# Patient Record
Sex: Male | Born: 1960 | Race: White | Hispanic: No | Smoking: Former smoker
Health system: Southern US, Community
[De-identification: ages and names within clinical notes are randomized; demographics above are authoritative.]

## PROBLEM LIST (undated history)

## (undated) DIAGNOSIS — J189 Pneumonia, unspecified organism: Secondary | ICD-10-CM

## (undated) DIAGNOSIS — W57XXXA Bitten or stung by nonvenomous insect and other nonvenomous arthropods, initial encounter: Secondary | ICD-10-CM

## (undated) DIAGNOSIS — E119 Type 2 diabetes mellitus without complications: Secondary | ICD-10-CM

## (undated) DIAGNOSIS — J449 Chronic obstructive pulmonary disease, unspecified: Secondary | ICD-10-CM

## (undated) DIAGNOSIS — I10 Essential (primary) hypertension: Secondary | ICD-10-CM

## (undated) DIAGNOSIS — J939 Pneumothorax, unspecified: Secondary | ICD-10-CM

## (undated) DIAGNOSIS — R0600 Dyspnea, unspecified: Secondary | ICD-10-CM

## (undated) DIAGNOSIS — J9621 Acute and chronic respiratory failure with hypoxia: Secondary | ICD-10-CM

## (undated) HISTORY — PX: LUNG REMOVAL, PARTIAL: SHX233

---
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
## 2013-06-04 ENCOUNTER — Emergency Department (HOSPITAL_COMMUNITY): Payer: Self-pay

## 2013-06-04 ENCOUNTER — Inpatient Hospital Stay (HOSPITAL_COMMUNITY)
Admission: EM | Admit: 2013-06-04 | Discharge: 2013-06-22 | DRG: 163 | Disposition: A | Discharge status: Rehabilitation Facility | Payer: Self-pay | Attending: General Surgery | Admitting: General Surgery

## 2013-06-04 ENCOUNTER — Encounter (HOSPITAL_COMMUNITY): Payer: Self-pay | Admitting: Emergency Medicine

## 2013-06-04 ENCOUNTER — Emergency Department (HOSPITAL_COMMUNITY): Payer: MEDICAID

## 2013-06-04 DIAGNOSIS — S272XXA Traumatic hemopneumothorax, initial encounter: Principal | ICD-10-CM | POA: Diagnosis present

## 2013-06-04 DIAGNOSIS — S225XXA Flail chest, initial encounter for closed fracture: Secondary | ICD-10-CM | POA: Diagnosis present

## 2013-06-04 DIAGNOSIS — E875 Hyperkalemia: Secondary | ICD-10-CM | POA: Diagnosis present

## 2013-06-04 DIAGNOSIS — J90 Pleural effusion, not elsewhere classified: Secondary | ICD-10-CM

## 2013-06-04 DIAGNOSIS — F10239 Alcohol dependence with withdrawal, unspecified: Secondary | ICD-10-CM | POA: Diagnosis not present

## 2013-06-04 DIAGNOSIS — E46 Unspecified protein-calorie malnutrition: Secondary | ICD-10-CM | POA: Diagnosis present

## 2013-06-04 DIAGNOSIS — R4182 Altered mental status, unspecified: Secondary | ICD-10-CM

## 2013-06-04 DIAGNOSIS — W11XXXA Fall on and from ladder, initial encounter: Secondary | ICD-10-CM | POA: Diagnosis present

## 2013-06-04 DIAGNOSIS — J189 Pneumonia, unspecified organism: Secondary | ICD-10-CM

## 2013-06-04 DIAGNOSIS — J942 Hemothorax: Secondary | ICD-10-CM | POA: Diagnosis present

## 2013-06-04 DIAGNOSIS — S42033A Displaced fracture of lateral end of unspecified clavicle, initial encounter for closed fracture: Secondary | ICD-10-CM | POA: Diagnosis present

## 2013-06-04 DIAGNOSIS — F17200 Nicotine dependence, unspecified, uncomplicated: Secondary | ICD-10-CM | POA: Diagnosis present

## 2013-06-04 DIAGNOSIS — R7309 Other abnormal glucose: Secondary | ICD-10-CM | POA: Diagnosis not present

## 2013-06-04 DIAGNOSIS — J9600 Acute respiratory failure, unspecified whether with hypoxia or hypercapnia: Secondary | ICD-10-CM

## 2013-06-04 DIAGNOSIS — D62 Acute posthemorrhagic anemia: Secondary | ICD-10-CM | POA: Diagnosis not present

## 2013-06-04 DIAGNOSIS — J9819 Other pulmonary collapse: Secondary | ICD-10-CM | POA: Diagnosis not present

## 2013-06-04 DIAGNOSIS — R091 Pleurisy: Secondary | ICD-10-CM | POA: Diagnosis not present

## 2013-06-04 DIAGNOSIS — I10 Essential (primary) hypertension: Secondary | ICD-10-CM | POA: Diagnosis present

## 2013-06-04 DIAGNOSIS — S2249XA Multiple fractures of ribs, unspecified side, initial encounter for closed fracture: Secondary | ICD-10-CM

## 2013-06-04 DIAGNOSIS — J9620 Acute and chronic respiratory failure, unspecified whether with hypoxia or hypercapnia: Secondary | ICD-10-CM

## 2013-06-04 DIAGNOSIS — R404 Transient alteration of awareness: Secondary | ICD-10-CM | POA: Diagnosis not present

## 2013-06-04 DIAGNOSIS — R339 Retention of urine, unspecified: Secondary | ICD-10-CM | POA: Diagnosis not present

## 2013-06-04 DIAGNOSIS — G9340 Encephalopathy, unspecified: Secondary | ICD-10-CM | POA: Diagnosis not present

## 2013-06-04 DIAGNOSIS — F1120 Opioid dependence, uncomplicated: Secondary | ICD-10-CM | POA: Diagnosis present

## 2013-06-04 DIAGNOSIS — F10939 Alcohol use, unspecified with withdrawal, unspecified: Secondary | ICD-10-CM | POA: Diagnosis not present

## 2013-06-04 DIAGNOSIS — F1020 Alcohol dependence, uncomplicated: Secondary | ICD-10-CM | POA: Diagnosis present

## 2013-06-04 DIAGNOSIS — E876 Hypokalemia: Secondary | ICD-10-CM | POA: Diagnosis not present

## 2013-06-04 DIAGNOSIS — J9622 Acute and chronic respiratory failure with hypercapnia: Secondary | ICD-10-CM

## 2013-06-04 DIAGNOSIS — F1210 Cannabis abuse, uncomplicated: Secondary | ICD-10-CM | POA: Diagnosis present

## 2013-06-04 DIAGNOSIS — W19XXXA Unspecified fall, initial encounter: Secondary | ICD-10-CM

## 2013-06-04 DIAGNOSIS — Z8249 Family history of ischemic heart disease and other diseases of the circulatory system: Secondary | ICD-10-CM

## 2013-06-04 DIAGNOSIS — F1010 Alcohol abuse, uncomplicated: Secondary | ICD-10-CM

## 2013-06-04 DIAGNOSIS — J9621 Acute and chronic respiratory failure with hypoxia: Secondary | ICD-10-CM

## 2013-06-04 HISTORY — DX: Pneumothorax, unspecified: J93.9

## 2013-06-04 LAB — CBC WITH DIFFERENTIAL/PLATELET
BASOS ABS: 0 10*3/uL (ref 0.0–0.1)
BASOS PCT: 0 % (ref 0–1)
EOS ABS: 0.1 10*3/uL (ref 0.0–0.7)
Eosinophils Relative: 0 % (ref 0–5)
HCT: 43.4 % (ref 39.0–52.0)
Hemoglobin: 14.5 g/dL (ref 13.0–17.0)
Lymphocytes Relative: 8 % — ABNORMAL LOW (ref 12–46)
Lymphs Abs: 1.2 10*3/uL (ref 0.7–4.0)
MCH: 33.3 pg (ref 26.0–34.0)
MCHC: 33.4 g/dL (ref 30.0–36.0)
MCV: 99.5 fL (ref 78.0–100.0)
Monocytes Absolute: 1.1 10*3/uL — ABNORMAL HIGH (ref 0.1–1.0)
Monocytes Relative: 8 % (ref 3–12)
NEUTROS ABS: 11.5 10*3/uL — AB (ref 1.7–7.7)
NEUTROS PCT: 84 % — AB (ref 43–77)
PLATELETS: 212 10*3/uL (ref 150–400)
RBC: 4.36 MIL/uL (ref 4.22–5.81)
RDW: 12.6 % (ref 11.5–15.5)
WBC: 13.9 10*3/uL — ABNORMAL HIGH (ref 4.0–10.5)

## 2013-06-04 LAB — BASIC METABOLIC PANEL
BUN: 15 mg/dL (ref 6–23)
CO2: 26 mEq/L (ref 19–32)
Calcium: 9.5 mg/dL (ref 8.4–10.5)
Chloride: 103 mEq/L (ref 96–112)
Creatinine, Ser: 0.83 mg/dL (ref 0.50–1.35)
Glucose, Bld: 139 mg/dL — ABNORMAL HIGH (ref 70–99)
POTASSIUM: 4.2 meq/L (ref 3.7–5.3)
Sodium: 142 mEq/L (ref 137–147)

## 2013-06-04 LAB — I-STAT TROPONIN, ED: TROPONIN I, POC: 0.01 ng/mL (ref 0.00–0.08)

## 2013-06-04 LAB — I-STAT CREATININE, ED: CREATININE: 1.1 mg/dL (ref 0.50–1.35)

## 2013-06-04 MED ORDER — ALBUTEROL SULFATE (2.5 MG/3ML) 0.083% IN NEBU
5.0000 mg | INHALATION_SOLUTION | Freq: Once | RESPIRATORY_TRACT | Status: AC
  Administered 2013-06-04: 5 mg via RESPIRATORY_TRACT
  Filled 2013-06-04: qty 6

## 2013-06-04 MED ORDER — IOHEXOL 300 MG/ML  SOLN
100.0000 mL | Freq: Once | INTRAMUSCULAR | Status: DC | PRN
Start: 2013-06-04 — End: 2013-06-04

## 2013-06-04 MED ORDER — HYDROMORPHONE HCL PF 1 MG/ML IJ SOLN
1.0000 mg | Freq: Once | INTRAMUSCULAR | Status: AC
  Administered 2013-06-04: 1 mg via INTRAVENOUS
  Filled 2013-06-04: qty 1

## 2013-06-04 MED ORDER — ONDANSETRON HCL 4 MG/2ML IJ SOLN
4.0000 mg | Freq: Once | INTRAMUSCULAR | Status: AC
  Administered 2013-06-04: 4 mg via INTRAVENOUS
  Filled 2013-06-04: qty 2

## 2013-06-04 MED ORDER — HYDROMORPHONE HCL PF 1 MG/ML IJ SOLN
0.5000 mg | INTRAMUSCULAR | Status: DC | PRN
  Administered 2013-06-04: 0.5 mg via INTRAVENOUS
  Filled 2013-06-04 (×2): qty 1

## 2013-06-04 MED ORDER — IOHEXOL 300 MG/ML  SOLN
100.0000 mL | Freq: Once | INTRAMUSCULAR | Status: AC | PRN
  Administered 2013-06-04: 100 mL via INTRAVENOUS

## 2013-06-04 NOTE — ED Notes (Signed)
Pt returned from radiology.

## 2013-06-04 NOTE — ED Notes (Addendum)
Presents post fall from ladder 6 feet up onto dirt, landed on back. C/o left side pain. Denies LOC, denies head pain.  Left breath sounds diminished-right diminished with expiratory wheezes. Pt states, "I can hardly breath"-chest equal expansion. No deformities.  Pt states, "My heart hurts, it started when I hit the ground"

## 2013-06-04 NOTE — ED Provider Notes (Signed)
Ruthell Rummage Venecia Mehl 8:00 PM patient discussed in sign out. Patient with a mechanical fall from a ladder landing onto his back and left side. CT scans pending. No LOC.  9:30 PM patient with multiple rib fractures from a fall. Small lung laceration and contusion. No large pneumothorax. Patient continues to be breathing well. Pt was seen by Attending Physician who has also spoken with Dr. Barry Dienes with Trauma.  Dr. Barry Dienes will see pt.  Martie Lee, PA-C 06/04/13 2210

## 2013-06-04 NOTE — ED Provider Notes (Signed)
CSN: [id number]     Arrival date & time 06/04/13  1733 History   First MD Initiated Contact with Patient 06/04/13 1833     Chief Complaint  Patient presents with  . Fall     (Consider location/radiation/quality/duration/timing/severity/associated sxs/prior Treatment) HPI Comments: Patient presents today after falling 6 feet from a ladder just prior to arrival.  He reports that he missed a step, which caused him to fall.  He states that he landed on his back and also hit his head when he fell.  He landed on dirt when he fell.  He denies LOC.  Denies nausea, vomiting, or vision changes.  Denies dizziness or lightheadedness.  He is complaining of pain of his back, left shoulder, chest, and head.  He reports mild SOB at this time.  He reports that he is currently not on any anticoagulants.  He denies any pain to his lower extremities.  Denies neck pain.  Denies abdominal pain. Denies numbness or tingling.    The history is provided by the patient.    Past Medical History  Diagnosis Date  . Pneumothorax    History reviewed. No pertinent past surgical history. History reviewed. No pertinent family history. History  Substance Use Topics  . Smoking status: Current Every Day Smoker    Types: Cigarettes  . Smokeless tobacco: Not on file  . Alcohol Use: Yes    Review of Systems  Respiratory: Positive for shortness of breath.   Cardiovascular: Positive for chest pain.  Gastrointestinal: Negative for nausea, vomiting and abdominal pain.  Musculoskeletal: Positive for back pain.       Left shoulder pain  Neurological: Positive for headaches.  All other systems reviewed and are negative.     Allergies  Review of patient's allergies indicates no known allergies.  Home Medications   Prior to Admission medications   Not on File   BP 166/97  Pulse 67  Temp(Src) 98.9 F (37.2 C)  Resp 18  SpO2 95% Physical Exam  Nursing note and vitals reviewed. Constitutional: He appears  well-developed and well-nourished.  HENT:  Head: Normocephalic and atraumatic.  Mouth/Throat: Oropharynx is clear and moist.  Eyes: EOM are normal. Pupils are equal, round, and reactive to light.  Cardiovascular: Normal rate, regular rhythm, normal heart sounds and intact distal pulses.   Pulses:      Radial pulses are 2+ on the left side.       Dorsalis pedis pulses are 2+ on the right side, and 2+ on the left side.  Pulmonary/Chest: Effort normal. No respiratory distress. He has decreased breath sounds. He has wheezes. He exhibits tenderness.  Diffuse decreased breath sounds Diffuse expiratory wheezing Tenderness to palpation of the left anterior chest  Abdominal: Soft. Bowel sounds are normal. He exhibits no distension and no mass. There is no tenderness. There is no rebound and no guarding.  Musculoskeletal:       Left shoulder: He exhibits decreased range of motion, tenderness and bony tenderness. He exhibits normal pulse.       Cervical back: He exhibits tenderness. He exhibits no swelling, no edema and no deformity.       Thoracic back: He exhibits tenderness and bony tenderness. He exhibits normal range of motion, no swelling, no edema and no deformity.       Lumbar back: He exhibits tenderness and bony tenderness. He exhibits normal range of motion, no swelling, no edema and no deformity.  Full ROM of lower extremities without pain  Neurological:  He is alert. No cranial nerve deficit or sensory deficit. GCS eye subscore is 4. GCS verbal subscore is 5. GCS motor subscore is 6.  Skin: Skin is warm and dry. No bruising and no ecchymosis noted.  Psychiatric: He has a normal mood and affect.    ED Course  Procedures (including critical care time) Labs Review Labs Reviewed  CBC WITH DIFFERENTIAL  BASIC METABOLIC PANEL  I-STAT [place], ED  I-STAT CREATIN[place], ED    Imaging Review No results found.   EKG Interpretation   Date/Time:  Monday June 04 2013 18:12:32  EDT Ventricular Rate:  75 PR Interval:  164 QRS Duration: 88 QT Interval:  398 QTC Calculation: 444 R Axis:   80 Text Interpretation:  Normal sinus rhythm Septal infarct , age  undetermined Abnormal ECG No previous ECGs available Confirmed by Wyvonnia Dusky   MD, STEPHEN [phone]) on 06/04/2013 6:45:40 PM    8:00 PM Patient signed out to Hazel Sams, PA-C at shift change.  Imaging pending.  MDM   Final diagnoses:  None   Patient presenting after falling 6 feet off of a ladder and landing on his back.  NO LOC.  Patient complaining of chest pain, back pain, left shoulder pain, and headache.  VSS.  No respiratory distress.  CT head, CT cervical spine, CT chest, CT abdomen and pelvis ordered.  Results are pending.  Hazel Sams, PA-C and Dr. Wyvonnia Dusky will follow up on results.      Hyman Bible, PA-C 06/06/13 1026

## 2013-06-05 ENCOUNTER — Inpatient Hospital Stay (HOSPITAL_COMMUNITY): Payer: Self-pay

## 2013-06-05 DIAGNOSIS — S270XXA Traumatic pneumothorax, initial encounter: Secondary | ICD-10-CM

## 2013-06-05 DIAGNOSIS — W11XXXA Fall on and from ladder, initial encounter: Secondary | ICD-10-CM | POA: Diagnosis present

## 2013-06-05 DIAGNOSIS — S225XXA Flail chest, initial encounter for closed fracture: Secondary | ICD-10-CM

## 2013-06-05 DIAGNOSIS — S42009A Fracture of unspecified part of unspecified clavicle, initial encounter for closed fracture: Secondary | ICD-10-CM

## 2013-06-05 DIAGNOSIS — S2249XA Multiple fractures of ribs, unspecified side, initial encounter for closed fracture: Secondary | ICD-10-CM

## 2013-06-05 LAB — MRSA PCR SCREENING: MRSA by PCR: NEGATIVE

## 2013-06-05 MED ORDER — ONDANSETRON HCL 4 MG PO TABS: 4.0000 mg | ORAL_TABLET | Freq: Four times a day (QID) | ORAL | Status: DC | PRN

## 2013-06-05 MED ORDER — ONDANSETRON HCL 4 MG/2ML IJ SOLN: 4.0000 mg | Freq: Four times a day (QID) | INTRAMUSCULAR | Status: DC | PRN

## 2013-06-05 MED ORDER — IPRATROPIUM-ALBUTEROL 0.5-2.5 (3) MG/3ML IN SOLN
3.0000 mL | Freq: Four times a day (QID) | RESPIRATORY_TRACT | Status: DC | PRN
  Administered 2013-06-05 – 2013-06-06 (×2): 3 mL via RESPIRATORY_TRACT
  Filled 2013-06-05 (×2): qty 3

## 2013-06-05 MED ORDER — KCL IN DEXTROSE-NACL 20-5-0.45 MEQ/L-%-% IV SOLN
INTRAVENOUS | Status: DC
  Administered 2013-06-05 – 2013-06-06 (×2): via INTRAVENOUS
  Filled 2013-06-05 (×4): qty 1000

## 2013-06-05 MED ORDER — OXYCODONE HCL 5 MG PO TABS
5.0000 mg | ORAL_TABLET | ORAL | Status: DC | PRN
  Administered 2013-06-05 (×2): 10 mg via ORAL
  Filled 2013-06-05 (×2): qty 2

## 2013-06-05 MED ORDER — OXYCODONE HCL 5 MG PO TABS
10.0000 mg | ORAL_TABLET | ORAL | Status: DC | PRN
  Administered 2013-06-05: 20 mg via ORAL
  Administered 2013-06-05 – 2013-06-06 (×2): 10 mg via ORAL
  Administered 2013-06-06 (×2): 20 mg via ORAL
  Administered 2013-06-06: 10 mg via ORAL
  Administered 2013-06-07 (×2): 20 mg via ORAL
  Filled 2013-06-05: qty 2
  Filled 2013-06-05: qty 4
  Filled 2013-06-05: qty 3
  Filled 2013-06-05 (×2): qty 4
  Filled 2013-06-05: qty 2
  Filled 2013-06-05: qty 4
  Filled 2013-06-05: qty 2
  Filled 2013-06-05: qty 4

## 2013-06-05 MED ORDER — ENOXAPARIN SODIUM 40 MG/0.4ML ~~LOC~~ SOLN
40.0000 mg | SUBCUTANEOUS | Status: DC
  Administered 2013-06-05: 40 mg via SUBCUTANEOUS
  Filled 2013-06-05 (×2): qty 0.4

## 2013-06-05 MED ORDER — POLYETHYLENE GLYCOL 3350 17 G PO PACK
17.0000 g | PACK | Freq: Every day | ORAL | Status: DC | PRN
  Filled 2013-06-05: qty 1

## 2013-06-05 MED ORDER — ACETAMINOPHEN 325 MG PO TABS: 650.0000 mg | ORAL_TABLET | ORAL | Status: DC | PRN

## 2013-06-05 MED ORDER — DOCUSATE SODIUM 100 MG PO CAPS
100.0000 mg | ORAL_CAPSULE | Freq: Two times a day (BID) | ORAL | Status: DC
  Administered 2013-06-05 – 2013-06-06 (×4): 100 mg via ORAL
  Filled 2013-06-05 (×7): qty 1

## 2013-06-05 MED ORDER — DIPHENHYDRAMINE HCL 50 MG/ML IJ SOLN
12.5000 mg | Freq: Four times a day (QID) | INTRAMUSCULAR | Status: DC | PRN
  Administered 2013-06-15: 25 mg via INTRAVENOUS
  Filled 2013-06-05: qty 1

## 2013-06-05 MED ORDER — HYDROMORPHONE HCL PF 1 MG/ML IJ SOLN
0.5000 mg | INTRAMUSCULAR | Status: DC | PRN
  Administered 2013-06-05: 1 mg via INTRAVENOUS
  Administered 2013-06-05: 0.5 mg via INTRAVENOUS
  Administered 2013-06-05: 1 mg via INTRAVENOUS
  Administered 2013-06-05: 0.5 mg via INTRAVENOUS
  Administered 2013-06-05 – 2013-06-06 (×5): 1 mg via INTRAVENOUS
  Filled 2013-06-05 (×8): qty 1

## 2013-06-05 NOTE — Progress Notes (Signed)
UR completed.  Alaysha Jefcoat, RN BSN MHA CCM Trauma/Neuro ICU Case Manager [phone]  

## 2013-06-05 NOTE — Progress Notes (Signed)
1000 on IS. To SDU. Continue pulmonary toilet. Patient examined and I agree with the assessment and plan  Georganna Skeans, MD, MPH, FACS Trauma: [phone] General Surgery: [phone]  06/05/2013 11:42 AM

## 2013-06-05 NOTE — Clinical Social Work Note (Signed)
Clinical Social Work Department BRIEF PSYCHOSOCIAL ASSESSMENT 06/05/2013  Patient:  James [name], James [name]     Account Number:  [id number]     Admit date:  06/04/2013  Clinical Social Worker:  Myles Lipps  Date/Time:  06/05/2013 12:00 N  Referred by:  Physician  Date Referred:  06/05/2013 Referred for  Substance Abuse  Psychosocial assessment   Other Referral:   Interview type:  Patient Other interview type:   Patient employer/landlord/friend at bedside    PSYCHOSOCIAL DATA Living Status:  ALONE Admitted from facility:   Level of care:   Primary support name:  Olean Ree  [phone]  /  [name] [phone] Primary support relationship to patient:  FRIEND Degree of support available:   Strong    CURRENT CONCERNS Current Concerns  None Noted   Other Concerns:    SOCIAL WORK ASSESSMENT / PLAN Clinical Social Worker met with patient and patient employer at bedside to offer support and discuss patient needs at discharge.  Patient states that he was putting vinyl siding up on his house when he fell about 6 ft. off the ladder.  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  Patient employer lives close by and plans to provide patient with support and assistance at discharge.    Clinical Social Worker inquired about current substance use.  Patient states that he drinks Bud Light every night after work and use to crush and snort pain medication that he would buy on the street.  Patient employer assisted patient in becoming sober.  Patient has been sober from pain medications for several months but continues to drink beer.  Patient states that he is aware of the long term risks of daily alcohol use and plans to stop use following hospitalization.  Patient employer expressed  concerns about patient discharging home with pain medications - CSW notified PA of patient history.  SBIRT complete.  Patient refused all resources at this time.  CSW signing off. Please reconsult if further needs arise prior to discharge.   Assessment/plan status:  No Further Intervention Required Other assessment/ plan:   Information/referral to community resources:   Clinical Social Worker offered patient community resources for his current substance use, however patient declined. Patient employer states that he will assist patient with resources if needed at discharge.    PATIENT'S/FAMILY'S RESPONSE TO PLAN OF CARE: Patient alert and oriented x3 sitting up in bed.  Patient engaged in conversation, however became very guarded once discussing his previous substance use.  Patient states that he is very grateful for his relationship with his employer and his sister who continuously have been able to assist. Patient with good support and hopeful to return home at discharge.  Patient understanding of social work role and appreciative for support and concern.

## 2013-06-05 NOTE — Progress Notes (Signed)
Patient ID: James [name], male   DOB: 06-05-1960, 53 y.o.   MRN: [id number]  LOS: 1 day   Subjective: C/o pain, sob.  No n/v/abd pain.    Objective: Vital signs in last 24 hours: Temp:  [97.8 F (36.6 C)-98.9 F (37.2 C)] 98.5 F (36.9 C) (06/02 0314) Pulse Rate:  [67-93] 83 (06/02 0900) Resp:  [10-24] 16 (06/02 0900) BP: (109-166)/(63-97) 116/70 mmHg (06/02 0900) SpO2:  [91 %-98 %] 95 % (06/02 0900) Weight:  [151 lb 0.2 oz (68.5 kg)] 151 lb 0.2 oz (68.5 kg) (06/02 0058) Last BM Date:  (PTA)  Lab Results:  CBC  Recent Labs  06/04/13 1828  WBC 13.9*  HGB 14.5  HCT 43.4  PLT 212   BMET  Recent Labs  06/04/13 1828 06/04/13 1914  NA 142  --   K 4.2  --   CL 103  --   CO2 26  --   GLUCOSE 139*  --   BUN 15  --   CREATININE 0.83 1.10  CALCIUM 9.5  --     Imaging: Dg Chest 1 View  06/04/2013   CLINICAL DATA:  History of trauma from a fall. Shortness of breath. Possible pneumothorax.  EXAM: CHEST - 1 VIEW  COMPARISON:  No priors.  FINDINGS: There are multiple displaced left-sided rib fractures involving the lateral aspect of the left second, third, fourth, fifth, sixth, seventh and eighth ribs. There appears to be a trace left-sided pneumothorax, and some gas in the subcutaneous tissues of the left chest wall. No acute consolidative airspace disease. No pleural effusions. No evidence of pulmonary edema. Heart size is normal. Mediastinal contours are unremarkable. Multiple old healed right-sided rib fractures posterolaterally are incidentally noted.  IMPRESSION: 1. Multiple acute mildly displaced left-sided rib fractures with trace left pneumothorax and a small amount of subcutaneous emphysema in the left chest wall. Critical Value/emergent results were called by telephone at the time of interpretation on 06/04/2013 at 8:20 PM to Dr. Ezequiel Essex, who verbally acknowledged these results.   Electronically Signed   By: Vinnie Langton M.D.   On: 06/04/2013 20:22   Dg Pelvis  1-2 Views  06/04/2013   CLINICAL DATA:  Fall from ladder  EXAM: PELVIS - 1-2 VIEW  COMPARISON:  None.  FINDINGS: There is no evidence of pelvic fracture or diastasis. No other pelvic bone lesions are seen.  IMPRESSION: Negative.   Electronically Signed   By: Jacqulynn Cadet M.D.   On: 06/04/2013 20:16   Ct Head Wo Contrast  06/04/2013   CLINICAL DATA:  FALL  EXAM: CT HEAD WITHOUT CONTRAST  TECHNIQUE: Contiguous axial images were obtained from the base of the skull through the vertex without intravenous contrast.  COMPARISON:  None.  FINDINGS: No acute intracranial abnormality. Specifically, no hemorrhage, hydrocephalus, mass lesion, acute infarction, or significant intracranial injury. No acute calvarial abnormality. The visualized paranasal sinuses and mastoid air cells are patent.  IMPRESSION: No acute intracranial abnormality.   Electronically Signed   By: Margaree Mackintosh M.D.   On: 06/04/2013 21:11   Ct Chest W Contrast  06/04/2013   CLINICAL DATA:  Fall.  Rib fractures.  EXAM: CT CHEST, ABDOMEN, AND PELVIS WITH CONTRAST  TECHNIQUE: Multidetector CT imaging of the chest, abdomen and pelvis was performed following the standard protocol during bolus administration of intravenous contrast.  CONTRAST:  163mL OMNIPAQUE IOHEXOL 300 MG/ML  SOLN  COMPARISON:  None.  FINDINGS: CT CHEST FINDINGS  There fractures of the first  through ninth ribs on the left. The fracture of the first and second ribs are oblique along the posterior aspect of the ribs, minimally displaced. Fractures of the third through eighth ribs are both posterior hand lateral. Several of the lateral fractures are mildly displaced. The ninth rib fracture is lateral only.  There is opacity in the dependent left lower lobe with a small pleural effusion. There is a cystic area with an air-fluid level that extends for 8.5 cm from superior to inferior measuring 2.7 cm transversely, consistent with along lung laceration. Opacity adjacent to this is  consistent with pulmonary contusion and atelectasis. There are additional small collections of air within this area of opacity consistent with small, loculated pneumothoraces. There is no evidence of a pneumothorax anteriorly or at the apex. There is significant paraseptal emphysema, which is greater on the left than the right. There is subcutaneous air adjacent to rib fractures along the left posterior lateral chest.  No right lung contusion or laceration.  No right pleural effusion.  There is also a displaced, mildly comminuted fracture of the midshaft of the left clavicle. Left shoulder joint is normally aligned as is the [facility] joint.  No neck base or axillary masses or adenopathy. No evidence of a mediastinal hematoma. The great vessels are unremarkable. Normal heart.  CT ABDOMEN AND PELVIS FINDINGS  Liver, spleen, gallbladder, pancreas, adrenal glands, kidneys, ureters, bladder: Unremarkable.  No bowel wall thickening to suggest a bowel hematoma. No evidence of a mesenteric hematoma. No evidence of a vascular injury. No fracture of the visualized spine. No pelvic fracture.  IMPRESSION: 1. Trauma to the left chest. There are numerous rib fractures, most of which are fractured in 2 locations. There is an associated posterior left lower lobe lung laceration with surrounding contusion/hemorrhage and a small effusion. Small areas of loculated pneumothorax are noted posteriorly, but there is no free or nondependent pneumothorax. Air has extended through rib fractures into the deep soft tissues of the posterior lateral left chest. 2. Comminuted left clavicle fracture. 3. Heart, mediastinum great vessels are unremarkable. No right lung or chest wall trauma. No vertebral fracture. 4. No acute findings below the diaphragm.   Electronically Signed   By: Lajean Manes M.D.   On: 06/04/2013 21:24   Ct Cervical Spine Wo Contrast  06/04/2013   CLINICAL DATA:  Fall.  EXAM: CT CERVICAL SPINE WITHOUT CONTRAST  TECHNIQUE:  Multidetector CT imaging of the cervical spine was performed without intravenous contrast. Multiplanar CT image reconstructions were also generated.  COMPARISON:  None.  FINDINGS: There is left first and second rib fractures which are partially imaged. Emphysema and pneumothorax or bleb at the left apex, reference dedicated chest CT imaging. There is no evidence of cervical spine fracture or traumatic malalignment. Cervical spondylosis which is most notable in the mid and lower region, with disc narrowing and endplate spurring advanced at C5-6 and C6-7. Disc osteophyte complex at C5-6 likely contacts the ventral cord. Uncovertebral spurs at C6-7 cause moderate narrowing of the bilateral foramina.  IMPRESSION: 1. No evidence of acute cervical spine injury. 2. Left upper chest injury, reference dedicated chest CT. 3. Cervical spondylosis causing C5-6 moderate spinal canal stenosis.   Electronically Signed   By: Jorje Guild M.D.   On: 06/04/2013 22:26   Ct Abdomen Pelvis W Contrast  06/04/2013   CLINICAL DATA:  Fall.  Rib fractures.  EXAM: CT CHEST, ABDOMEN, AND PELVIS WITH CONTRAST  TECHNIQUE: Multidetector CT imaging of the chest, abdomen and  pelvis was performed following the standard protocol during bolus administration of intravenous contrast.  CONTRAST:  175mL OMNIPAQUE IOHEXOL 300 MG/ML  SOLN  COMPARISON:  None.  FINDINGS: CT CHEST FINDINGS  There fractures of the first through ninth ribs on the left. The fracture of the first and second ribs are oblique along the posterior aspect of the ribs, minimally displaced. Fractures of the third through eighth ribs are both posterior hand lateral. Several of the lateral fractures are mildly displaced. The ninth rib fracture is lateral only.  There is opacity in the dependent left lower lobe with a small pleural effusion. There is a cystic area with an air-fluid level that extends for 8.5 cm from superior to inferior measuring 2.7 cm transversely, consistent with  along lung laceration. Opacity adjacent to this is consistent with pulmonary contusion and atelectasis. There are additional small collections of air within this area of opacity consistent with small, loculated pneumothoraces. There is no evidence of a pneumothorax anteriorly or at the apex. There is significant paraseptal emphysema, which is greater on the left than the right. There is subcutaneous air adjacent to rib fractures along the left posterior lateral chest.  No right lung contusion or laceration.  No right pleural effusion.  There is also a displaced, mildly comminuted fracture of the midshaft of the left clavicle. Left shoulder joint is normally aligned as is the [facility] joint.  No neck base or axillary masses or adenopathy. No evidence of a mediastinal hematoma. The great vessels are unremarkable. Normal heart.  CT ABDOMEN AND PELVIS FINDINGS  Liver, spleen, gallbladder, pancreas, adrenal glands, kidneys, ureters, bladder: Unremarkable.  No bowel wall thickening to suggest a bowel hematoma. No evidence of a mesenteric hematoma. No evidence of a vascular injury. No fracture of the visualized spine. No pelvic fracture.  IMPRESSION: 1. Trauma to the left chest. There are numerous rib fractures, most of which are fractured in 2 locations. There is an associated posterior left lower lobe lung laceration with surrounding contusion/hemorrhage and a small effusion. Small areas of loculated pneumothorax are noted posteriorly, but there is no free or nondependent pneumothorax. Air has extended through rib fractures into the deep soft tissues of the posterior lateral left chest. 2. Comminuted left clavicle fracture. 3. Heart, mediastinum great vessels are unremarkable. No right lung or chest wall trauma. No vertebral fracture. 4. No acute findings below the diaphragm.   Electronically Signed   By: Lajean Manes M.D.   On: 06/04/2013 21:24   Dg Chest Port 1 View  06/05/2013   CLINICAL DATA:  Recent trauma with small  pneumothorax  EXAM: PORTABLE CHEST - 1 VIEW  COMPARISON:  Chest radiograph and chest CT June 04, 2013  FINDINGS: There is a comminuted fracture of the left clavicle as well as multiple displaced left-sided rib fractures. There is a small left base hydropneumothorax. There is no tension component. The small focus of subcutaneous air is again noted without progression. There is mild left base atelectasis. Elsewhere lungs are clear. Heart size and pulmonary vascularity are normal. No adenopathy.  IMPRESSION: Small left base hydropneumothorax tension component. There is left base atelectasis. There is minimal subcutaneous emphysema, not progressed from 1 day prior. Multiple rib fractures and left clavicle fracture on the left again noted. There old healed rib fractures on the right. Right lung is clear.   Electronically Signed   By: Lowella Grip M.D.   On: 06/05/2013 08:08   Dg Shoulder Left  06/04/2013   CLINICAL DATA:  Trauma and pain.  EXAM: LEFT SHOULDER - 2+ VIEW  COMPARISON:  None.  FINDINGS: Mild degenerative irregularity of the acromioclavicular and glenohumeral joints. No acute fracture or dislocation about the shoulder. Upper left rib fractures which are detailed on dedicated radiographs.  IMPRESSION: Degenerative change without acute finding about the left shoulder.  Upper left rib fractures.  Please see dedicated radiographs.   Electronically Signed   By: Abigail Miyamoto M.D.   On: 06/04/2013 20:19     PE: General appearance: alert, cooperative and no distress Resp: clear to auscultation bilaterally Cardio: regular rate and rhythm, S1, S2 normal, no murmur, click, rub or gallop GI: soft, non-tender; bowel sounds normal; no masses,  no organomegaly Extremities: extremities normal, atraumatic, no cyanosis or edema    Patient Active Problem List   Diagnosis Date Noted  . Fall from ladder 06/05/2013     Assessment/Plan: Fall from ladder Multiple left sided rib fracture/HPTX-no tension  component.  IS(pulling 1055m).  Hx tobacco use, high risk of developing PNA Left comminuted clavicle fracture-Dr. Rolena Infante consulted.  Brace for comfort  VTE - SCD's, Lovenox  FEN - tolerating diet.  Increase pain medicaiton Dispo -- transfer to [employer], ANP-BC Pager: [phone] General Trauma PA Pager: [phone]   06/05/2013 9:33 AM

## 2013-06-05 NOTE — H&P (Signed)
History   James [name] is an 53 y.o. male.   Chief Complaint:  Chief Complaint  Patient presents with  . Fall    Fall This is a new problem. The current episode started today. The problem has been unchanged. Associated symptoms include chest pain and joint swelling.  Patient is a 53 year old male who fell off a ladder today.  The patient missed a step and fell. He states that he hit his back on the ground when he fell. He denies loss of consciousness. He states that he did not strike any objects. He denies nausea or vomiting. He does complain of severe left chest pain and left shoulder pain.  Past Medical History  Diagnosis Date  . Pneumothorax     History reviewed. No pertinent past surgical history.  History reviewed. No pertinent family history. Social History:  reports that he has been smoking Cigarettes.  He has been smoking about 0.00 packs per day. He does not have any smokeless tobacco history on file. He reports that he drinks alcohol. He reports that he uses illicit drugs (Marijuana).  Allergies  No Known Allergies  Home Medications  None.    Trauma Course   Results for orders placed during the hospital encounter of 06/04/13 (from the past 48 hour(s))  CBC WITH DIFFERENTIAL     Status: Abnormal   Collection Time    06/04/13  6:28 PM      Result Value Ref Range   WBC 13.9 (*) 4.0 - 10.5 K/uL   RBC 4.36  4.22 - 5.81 MIL/uL   Hemoglobin 14.5  13.0 - 17.0 g/dL   HCT 43.4  39.0 - 52.0 %   MCV 99.5  78.0 - 100.0 fL   MCH 33.3  26.0 - 34.0 pg   MCHC 33.4  30.0 - 36.0 g/dL   RDW 12.6  11.5 - 15.5 %   Platelets 212  150 - 400 K/uL   Neutrophils Relative % 84 (*) 43 - 77 %   Neutro Abs 11.5 (*) 1.7 - 7.7 K/uL   Lymphocytes Relative 8 (*) 12 - 46 %   Lymphs Abs 1.2  0.7 - 4.0 K/uL   Monocytes Relative 8  3 - 12 %   Monocytes Absolute 1.1 (*) 0.1 - 1.0 K/uL   Eosinophils Relative 0  0 - 5 %   Eosinophils Absolute 0.1  0.0 - 0.7 K/uL   Basophils Relative 0  0 - 1 %    Basophils Absolute 0.0  0.0 - 0.1 K/uL  BASIC METABOLIC PANEL     Status: Abnormal   Collection Time    06/04/13  6:28 PM      Result Value Ref Range   Sodium 142  137 - 147 mEq/L   Potassium 4.2  3.7 - 5.3 mEq/L   Chloride 103  96 - 112 mEq/L   CO2 26  19 - 32 mEq/L   Glucose, Bld 139 (*) 70 - 99 mg/dL   BUN 15  6 - 23 mg/dL   Creatinine, Ser 0.83  0.50 - 1.35 mg/dL   Calcium 9.5  8.4 - 10.5 mg/dL   GFR calc non Af Amer >90  >90 mL/min   GFR calc Af Amer >90  >90 mL/min   Comment: (NOTE)     The eGFR has been calculated using the CKD EPI equation.     This calculation has not been validated in all clinical situations.     eGFR's persistently <90 mL/min signify  possible Chronic Kidney     Disease.  Randolm Idol, ED     Status: None   Collection Time    06/04/13  6:53 PM      Result Value Ref Range   Troponin i, poc 0.01  0.00 - 0.08 ng/mL   Comment 3            Comment: Due to the release kinetics of cTnI,     a negative result within the first hours     of the onset of symptoms does not rule out     myocardial infarction with certainty.     If myocardial infarction is still suspected,     repeat the test at appropriate intervals.  I-STAT CREATININE, ED     Status: None   Collection Time    06/04/13  7:14 PM      Result Value Ref Range   Creatinine, Ser 1.10  0.50 - 1.35 mg/dL   Dg Chest 1 View  06/04/2013   CLINICAL DATA:  History of trauma from a fall. Shortness of breath. Possible pneumothorax.  EXAM: CHEST - 1 VIEW  COMPARISON:  No priors.  FINDINGS: There are multiple displaced left-sided rib fractures involving the lateral aspect of the left second, third, fourth, fifth, sixth, seventh and eighth ribs. There appears to be a trace left-sided pneumothorax, and some gas in the subcutaneous tissues of the left chest wall. No acute consolidative airspace disease. No pleural effusions. No evidence of pulmonary edema. Heart size is normal. Mediastinal contours are  unremarkable. Multiple old healed right-sided rib fractures posterolaterally are incidentally noted.  IMPRESSION: 1. Multiple acute mildly displaced left-sided rib fractures with trace left pneumothorax and a small amount of subcutaneous emphysema in the left chest wall. Critical Value/emergent results were called by telephone at the time of interpretation on 06/04/2013 at 8:20 PM to Dr. Ezequiel Essex, who verbally acknowledged these results.   Electronically Signed   By: Vinnie Langton M.D.   On: 06/04/2013 20:22   Dg Pelvis 1-2 Views  06/04/2013   CLINICAL DATA:  Fall from ladder  EXAM: PELVIS - 1-2 VIEW  COMPARISON:  None.  FINDINGS: There is no evidence of pelvic fracture or diastasis. No other pelvic bone lesions are seen.  IMPRESSION: Negative.   Electronically Signed   By: Jacqulynn Cadet M.D.   On: 06/04/2013 20:16   Ct Head Wo Contrast  06/04/2013   CLINICAL DATA:  FALL  EXAM: CT HEAD WITHOUT CONTRAST  TECHNIQUE: Contiguous axial images were obtained from the base of the skull through the vertex without intravenous contrast.  COMPARISON:  None.  FINDINGS: No acute intracranial abnormality. Specifically, no hemorrhage, hydrocephalus, mass lesion, acute infarction, or significant intracranial injury. No acute calvarial abnormality. The visualized paranasal sinuses and mastoid air cells are patent.  IMPRESSION: No acute intracranial abnormality.   Electronically Signed   By: Margaree Mackintosh M.D.   On: 06/04/2013 21:11   Ct Chest W Contrast  06/04/2013   CLINICAL DATA:  Fall.  Rib fractures.  EXAM: CT CHEST, ABDOMEN, AND PELVIS WITH CONTRAST  TECHNIQUE: Multidetector CT imaging of the chest, abdomen and pelvis was performed following the standard protocol during bolus administration of intravenous contrast.  CONTRAST:  133m OMNIPAQUE IOHEXOL 300 MG/ML  SOLN  COMPARISON:  None.  FINDINGS: CT CHEST FINDINGS  There fractures of the first through ninth ribs on the left. The fracture of the first and  second ribs are oblique along the posterior aspect of  the ribs, minimally displaced. Fractures of the third through eighth ribs are both posterior hand lateral. Several of the lateral fractures are mildly displaced. The ninth rib fracture is lateral only.  There is opacity in the dependent left lower lobe with a small pleural effusion. There is a cystic area with an air-fluid level that extends for 8.5 cm from superior to inferior measuring 2.7 cm transversely, consistent with along lung laceration. Opacity adjacent to this is consistent with pulmonary contusion and atelectasis. There are additional small collections of air within this area of opacity consistent with small, loculated pneumothoraces. There is no evidence of a pneumothorax anteriorly or at the apex. There is significant paraseptal emphysema, which is greater on the left than the right. There is subcutaneous air adjacent to rib fractures along the left posterior lateral chest.  No right lung contusion or laceration.  No right pleural effusion.  There is also a displaced, mildly comminuted fracture of the midshaft of the left clavicle. Left shoulder joint is normally aligned as is the [facility] joint.  No neck base or axillary masses or adenopathy. No evidence of a mediastinal hematoma. The great vessels are unremarkable. Normal heart.  CT ABDOMEN AND PELVIS FINDINGS  Liver, spleen, gallbladder, pancreas, adrenal glands, kidneys, ureters, bladder: Unremarkable.  No bowel wall thickening to suggest a bowel hematoma. No evidence of a mesenteric hematoma. No evidence of a vascular injury. No fracture of the visualized spine. No pelvic fracture.  IMPRESSION: 1. Trauma to the left chest. There are numerous rib fractures, most of which are fractured in 2 locations. There is an associated posterior left lower lobe lung laceration with surrounding contusion/hemorrhage and a small effusion. Small areas of loculated pneumothorax are noted posteriorly, but there is no  free or nondependent pneumothorax. Air has extended through rib fractures into the deep soft tissues of the posterior lateral left chest. 2. Comminuted left clavicle fracture. 3. Heart, mediastinum great vessels are unremarkable. No right lung or chest wall trauma. No vertebral fracture. 4. No acute findings below the diaphragm.   Electronically Signed   By: Lajean Manes M.D.   On: 06/04/2013 21:24   Ct Cervical Spine Wo Contrast  06/04/2013   CLINICAL DATA:  Fall.  EXAM: CT CERVICAL SPINE WITHOUT CONTRAST  TECHNIQUE: Multidetector CT imaging of the cervical spine was performed without intravenous contrast. Multiplanar CT image reconstructions were also generated.  COMPARISON:  None.  FINDINGS: There is left first and second rib fractures which are partially imaged. Emphysema and pneumothorax or bleb at the left apex, reference dedicated chest CT imaging. There is no evidence of cervical spine fracture or traumatic malalignment. Cervical spondylosis which is most notable in the mid and lower region, with disc narrowing and endplate spurring advanced at C5-6 and C6-7. Disc osteophyte complex at C5-6 likely contacts the ventral cord. Uncovertebral spurs at C6-7 cause moderate narrowing of the bilateral foramina.  IMPRESSION: 1. No evidence of acute cervical spine injury. 2. Left upper chest injury, reference dedicated chest CT. 3. Cervical spondylosis causing C5-6 moderate spinal canal stenosis.   Electronically Signed   By: Jorje Guild M.D.   On: 06/04/2013 22:26   Ct Abdomen Pelvis W Contrast  06/04/2013   CLINICAL DATA:  Fall.  Rib fractures.  EXAM: CT CHEST, ABDOMEN, AND PELVIS WITH CONTRAST  TECHNIQUE: Multidetector CT imaging of the chest, abdomen and pelvis was performed following the standard protocol during bolus administration of intravenous contrast.  CONTRAST:  14m OMNIPAQUE IOHEXOL 300 MG/ML  SOLN  COMPARISON:  None.  FINDINGS: CT CHEST FINDINGS  There fractures of the first through ninth ribs  on the left. The fracture of the first and second ribs are oblique along the posterior aspect of the ribs, minimally displaced. Fractures of the third through eighth ribs are both posterior hand lateral. Several of the lateral fractures are mildly displaced. The ninth rib fracture is lateral only.  There is opacity in the dependent left lower lobe with a small pleural effusion. There is a cystic area with an air-fluid level that extends for 8.5 cm from superior to inferior measuring 2.7 cm transversely, consistent with along lung laceration. Opacity adjacent to this is consistent with pulmonary contusion and atelectasis. There are additional small collections of air within this area of opacity consistent with small, loculated pneumothoraces. There is no evidence of a pneumothorax anteriorly or at the apex. There is significant paraseptal emphysema, which is greater on the left than the right. There is subcutaneous air adjacent to rib fractures along the left posterior lateral chest.  No right lung contusion or laceration.  No right pleural effusion.  There is also a displaced, mildly comminuted fracture of the midshaft of the left clavicle. Left shoulder joint is normally aligned as is the [employer] joint.  No neck base or axillary masses or adenopathy. No evidence of a mediastinal hematoma. The great vessels are unremarkable. Normal heart.  CT ABDOMEN AND PELVIS FINDINGS  Liver, spleen, gallbladder, pancreas, adrenal glands, kidneys, ureters, bladder: Unremarkable.  No bowel wall thickening to suggest a bowel hematoma. No evidence of a mesenteric hematoma. No evidence of a vascular injury. No fracture of the visualized spine. No pelvic fracture.  IMPRESSION: 1. Trauma to the left chest. There are numerous rib fractures, most of which are fractured in 2 locations. There is an associated posterior left lower lobe lung laceration with surrounding contusion/hemorrhage and a small effusion. Small areas of loculated  pneumothorax are noted posteriorly, but there is no free or nondependent pneumothorax. Air has extended through rib fractures into the deep soft tissues of the posterior lateral left chest. 2. Comminuted left clavicle fracture. 3. Heart, mediastinum great vessels are unremarkable. No right lung or chest wall trauma. No vertebral fracture. 4. No acute findings below the diaphragm.   Electronically Signed   By: Lajean Manes M.D.   On: 06/04/2013 21:24   Dg Shoulder Left  06/04/2013   CLINICAL DATA:  Trauma and pain.  EXAM: LEFT SHOULDER - 2+ VIEW  COMPARISON:  None.  FINDINGS: Mild degenerative irregularity of the acromioclavicular and glenohumeral joints. No acute fracture or dislocation about the shoulder. Upper left rib fractures which are detailed on dedicated radiographs.  IMPRESSION: Degenerative change without acute finding about the left shoulder.  Upper left rib fractures.  Please see dedicated radiographs.   Electronically Signed   By: Abigail Miyamoto M.D.   On: 06/04/2013 20:19    Review of Systems  Constitutional: Negative.   HENT: Negative.   Eyes: Negative.   Respiratory: Positive for shortness of breath (mild with deep breath).   Cardiovascular: Positive for chest pain.  Gastrointestinal: Negative.   Genitourinary: Negative.   Musculoskeletal: Positive for joint swelling.       Severe left collarbone pain   Skin: Negative.   Neurological: Negative.   Endo/Heme/Allergies: Negative.   Psychiatric/Behavioral: Negative.     Blood pressure 119/74, pulse 73, temperature 98.9 F (37.2 C), resp. rate 16, SpO2 97.00%. Physical Exam  Constitutional: He is oriented to  person, place, and time. He appears well-developed and well-nourished. No distress.  HENT:  Head: Normocephalic and atraumatic.  Right Ear: External ear and ear canal normal. No hemotympanum.  Left Ear: External ear and ear canal normal. No hemotympanum.  Nose: Nose normal.  Mouth/Throat: Oropharynx is clear and moist. No  oropharyngeal exudate.  Eyes: Conjunctivae are normal. Pupils are equal, round, and reactive to light. Right eye exhibits no discharge. Left eye exhibits no discharge. No scleral icterus.  Neck: Normal range of motion. Neck supple. No tracheal deviation present. No thyromegaly present.  Cardiovascular: Normal rate, regular rhythm, normal heart sounds and intact distal pulses.  Exam reveals no gallop and no friction rub.   No murmur heard. Respiratory: Effort normal and breath sounds normal. No stridor. No respiratory distress. He has no wheezes. He has no rales. He exhibits tenderness (left sided).  GI: Soft. Bowel sounds are normal. He exhibits no distension and no mass. There is no tenderness. There is no rebound and no guarding.  Musculoskeletal: He exhibits edema and tenderness.       Left shoulder: He exhibits tenderness, bony tenderness, swelling and deformity.  Lymphadenopathy:    He has no cervical adenopathy.  Neurological: He is alert and oriented to person, place, and time. Coordination normal.  Skin: Skin is warm and dry. No rash noted. He is not diaphoretic. No erythema. No pallor.  Psychiatric: He has a normal mood and affect. His behavior is normal. Judgment and thought content normal.     Assessment/Plan Fall Left comminuted clavicle fracture.   Multiple left sided rib fractures with flail chest  Occult loculated pneumothorax Small left hemothorax  Admit to ICU for support and pain control Incentive spirometry, cough, deep breathing Ortho consult non urgent Sling for comfort.    Advised patient of high risk of pneumonia.      Stark Klein 06/05/2013, 12:18 AM   Procedures

## 2013-06-05 NOTE — Progress Notes (Signed)
PT Cancellation Note  Patient Details Name: James [name] MRN: [id number] DOB: [birth year]   Cancelled Treatment:    Reason Eval/Treat Not Completed: Medical issues which prohibited therapy. Pt going for stat  Chest x ray. Will Hold.   [name] 06/05/2013, 1:33 PM [name], [place] DPT  [phone]

## 2013-06-05 NOTE — Consult Note (Signed)
Patient ID: James [name] MRN: [id number] DOB/AGE: April 01, 1960 53 y.o.  Admit date: 06/04/2013  Admission Diagnoses:  Active Problems:   Fall from ladder   HPI: 53 yo wm is being seen at the request of the trauma service for left clavicle fracture.  Patient states that he fell off a ladder yesterday suffering the injury.  Also has multiple rib fractures and pneumothorax.    Past Medical History: Past Medical History  Diagnosis Date  . Pneumothorax     Surgical History: History reviewed. No pertinent past surgical history.  Family History: History reviewed. No pertinent family history.  Social History: History   Social History  . Marital Status: Married    Spouse Name: N/A    Number of Children: N/A  . Years of Education: N/A   Occupational History  . Not on file.   Social History Main Topics  . Smoking status: Current Every Day Smoker    Types: Cigarettes  . Smokeless tobacco: Not on file  . Alcohol Use: Yes  . Drug Use: Yes    Special: Marijuana  . Sexual Activity: Not on file   Other Topics Concern  . Not on file   Social History Narrative  . No narrative on file    Allergies: Review of patient's allergies indicates no known allergies.  Medications: I have reviewed the patient's current medications.  Vital Signs: Patient Vitals for the past 24 hrs:  BP Temp Temp src Pulse Resp SpO2 Height Weight  06/05/13 1100 107/65 mmHg - - 72 14 98 % - -  06/05/13 1000 119/78 mmHg - - 79 17 97 % - -  06/05/13 0900 116/70 mmHg - - 83 16 95 % - -  06/05/13 0800 127/78 mmHg 97.8 F (36.6 C) Oral 89 22 91 % - -  06/05/13 0700 114/67 mmHg - - 79 14 93 % - -  06/05/13 0600 118/63 mmHg - - 79 11 91 % - -  06/05/13 0500 109/70 mmHg - - 78 14 94 % - -  06/05/13 0400 110/72 mmHg - - 81 14 94 % - -  06/05/13 0314 - 98.5 F (36.9 C) Axillary - - - - -  06/05/13 0300 122/72 mmHg - - 76 12 92 % - -  06/05/13 0200 123/80 mmHg - - 77 13 95 % - -  06/05/13 0058  128/82 mmHg 97.8 F (36.6 C) Oral 70 16 97 % 5' 9.5" (1.765 m) 68.5 kg (151 lb 0.2 oz)  06/05/13 0008 119/74 mmHg - - 73 16 97 % - -  06/05/13 0000 119/74 mmHg - - 74 10 97 % - -  06/04/13 2300 123/75 mmHg - - 79 13 95 % - -  06/04/13 2230 133/77 mmHg - - 80 15 97 % - -  06/04/13 2200 141/82 mmHg - - 86 24 97 % - -  06/04/13 2130 152/87 mmHg - - 83 17 97 % - -  06/04/13 2119 157/84 mmHg - - 86 21 98 % - -  06/04/13 2000 156/86 mmHg - - 93 19 94 % - -  06/04/13 1947 156/86 mmHg - - - - 95 % - -  06/04/13 1900 163/89 mmHg - - 80 20 93 % - -  06/04/13 1743 166/97 mmHg 98.9 F (37.2 C) - 67 18 95 % - -    Radiology: Dg Chest 1 View  06/04/2013   CLINICAL DATA:  History of trauma from a fall. Shortness of breath.  Possible pneumothorax.  EXAM: CHEST - 1 VIEW  COMPARISON:  No priors.  FINDINGS: There are multiple displaced left-sided rib fractures involving the lateral aspect of the left second, third, fourth, fifth, sixth, seventh and eighth ribs. There appears to be a trace left-sided pneumothorax, and some gas in the subcutaneous tissues of the left chest wall. No acute consolidative airspace disease. No pleural effusions. No evidence of pulmonary edema. Heart size is normal. Mediastinal contours are unremarkable. Multiple old healed right-sided rib fractures posterolaterally are incidentally noted.  IMPRESSION: 1. Multiple acute mildly displaced left-sided rib fractures with trace left pneumothorax and a small amount of subcutaneous emphysema in the left chest wall. Critical Value/emergent results were called by telephone at the time of interpretation on 06/04/2013 at 8:20 PM to Dr. Ezequiel Essex, who verbally acknowledged these results.   Electronically Signed   By: Vinnie Langton M.D.   On: 06/04/2013 20:22   Dg Pelvis 1-2 Views  06/04/2013   CLINICAL DATA:  Fall from ladder  EXAM: PELVIS - 1-2 VIEW  COMPARISON:  None.  FINDINGS: There is no evidence of pelvic fracture or diastasis. No other  pelvic bone lesions are seen.  IMPRESSION: Negative.   Electronically Signed   By: Jacqulynn Cadet M.D.   On: 06/04/2013 20:16   Ct Head Wo Contrast  06/04/2013   CLINICAL DATA:  FALL  EXAM: CT HEAD WITHOUT CONTRAST  TECHNIQUE: Contiguous axial images were obtained from the base of the skull through the vertex without intravenous contrast.  COMPARISON:  None.  FINDINGS: No acute intracranial abnormality. Specifically, no hemorrhage, hydrocephalus, mass lesion, acute infarction, or significant intracranial injury. No acute calvarial abnormality. The visualized paranasal sinuses and mastoid air cells are patent.  IMPRESSION: No acute intracranial abnormality.   Electronically Signed   By: Margaree Mackintosh M.D.   On: 06/04/2013 21:11   Ct Chest W Contrast  06/04/2013   CLINICAL DATA:  Fall.  Rib fractures.  EXAM: CT CHEST, ABDOMEN, AND PELVIS WITH CONTRAST  TECHNIQUE: Multidetector CT imaging of the chest, abdomen and pelvis was performed following the standard protocol during bolus administration of intravenous contrast.  CONTRAST:  143mL OMNIPAQUE IOHEXOL 300 MG/ML  SOLN  COMPARISON:  None.  FINDINGS: CT CHEST FINDINGS  There fractures of the first through ninth ribs on the left. The fracture of the first and second ribs are oblique along the posterior aspect of the ribs, minimally displaced. Fractures of the third through eighth ribs are both posterior hand lateral. Several of the lateral fractures are mildly displaced. The ninth rib fracture is lateral only.  There is opacity in the dependent left lower lobe with a small pleural effusion. There is a cystic area with an air-fluid level that extends for 8.5 cm from superior to inferior measuring 2.7 cm transversely, consistent with along lung laceration. Opacity adjacent to this is consistent with pulmonary contusion and atelectasis. There are additional small collections of air within this area of opacity consistent with small, loculated pneumothoraces. There is  no evidence of a pneumothorax anteriorly or at the apex. There is significant paraseptal emphysema, which is greater on the left than the right. There is subcutaneous air adjacent to rib fractures along the left posterior lateral chest.  No right lung contusion or laceration.  No right pleural effusion.  There is also a displaced, mildly comminuted fracture of the midshaft of the left clavicle. Left shoulder joint is normally aligned as is the [facility] joint.  No neck base or axillary  masses or adenopathy. No evidence of a mediastinal hematoma. The great vessels are unremarkable. Normal heart.  CT ABDOMEN AND PELVIS FINDINGS  Liver, spleen, gallbladder, pancreas, adrenal glands, kidneys, ureters, bladder: Unremarkable.  No bowel wall thickening to suggest a bowel hematoma. No evidence of a mesenteric hematoma. No evidence of a vascular injury. No fracture of the visualized spine. No pelvic fracture.  IMPRESSION: 1. Trauma to the left chest. There are numerous rib fractures, most of which are fractured in 2 locations. There is an associated posterior left lower lobe lung laceration with surrounding contusion/hemorrhage and a small effusion. Small areas of loculated pneumothorax are noted posteriorly, but there is no free or nondependent pneumothorax. Air has extended through rib fractures into the deep soft tissues of the posterior lateral left chest. 2. Comminuted left clavicle fracture. 3. Heart, mediastinum great vessels are unremarkable. No right lung or chest wall trauma. No vertebral fracture. 4. No acute findings below the diaphragm.   Electronically Signed   By: Lajean Manes M.D.   On: 06/04/2013 21:24   Ct Cervical Spine Wo Contrast  06/04/2013   CLINICAL DATA:  Fall.  EXAM: CT CERVICAL SPINE WITHOUT CONTRAST  TECHNIQUE: Multidetector CT imaging of the cervical spine was performed without intravenous contrast. Multiplanar CT image reconstructions were also generated.  COMPARISON:  None.  FINDINGS: There is left  first and second rib fractures which are partially imaged. Emphysema and pneumothorax or bleb at the left apex, reference dedicated chest CT imaging. There is no evidence of cervical spine fracture or traumatic malalignment. Cervical spondylosis which is most notable in the mid and lower region, with disc narrowing and endplate spurring advanced at C5-6 and C6-7. Disc osteophyte complex at C5-6 likely contacts the ventral cord. Uncovertebral spurs at C6-7 cause moderate narrowing of the bilateral foramina.  IMPRESSION: 1. No evidence of acute cervical spine injury. 2. Left upper chest injury, reference dedicated chest CT. 3. Cervical spondylosis causing C5-6 moderate spinal canal stenosis.   Electronically Signed   By: Jorje Guild M.D.   On: 06/04/2013 22:26   Ct Abdomen Pelvis W Contrast  06/04/2013   CLINICAL DATA:  Fall.  Rib fractures.  EXAM: CT CHEST, ABDOMEN, AND PELVIS WITH CONTRAST  TECHNIQUE: Multidetector CT imaging of the chest, abdomen and pelvis was performed following the standard protocol during bolus administration of intravenous contrast.  CONTRAST:  148mL OMNIPAQUE IOHEXOL 300 MG/ML  SOLN  COMPARISON:  None.  FINDINGS: CT CHEST FINDINGS  There fractures of the first through ninth ribs on the left. The fracture of the first and second ribs are oblique along the posterior aspect of the ribs, minimally displaced. Fractures of the third through eighth ribs are both posterior hand lateral. Several of the lateral fractures are mildly displaced. The ninth rib fracture is lateral only.  There is opacity in the dependent left lower lobe with a small pleural effusion. There is a cystic area with an air-fluid level that extends for 8.5 cm from superior to inferior measuring 2.7 cm transversely, consistent with along lung laceration. Opacity adjacent to this is consistent with pulmonary contusion and atelectasis. There are additional small collections of air within this area of opacity consistent with  small, loculated pneumothoraces. There is no evidence of a pneumothorax anteriorly or at the apex. There is significant paraseptal emphysema, which is greater on the left than the right. There is subcutaneous air adjacent to rib fractures along the left posterior lateral chest.  No right lung contusion or laceration.  No right pleural effusion.  There is also a displaced, mildly comminuted fracture of the midshaft of the left clavicle. Left shoulder joint is normally aligned as is the [facility] joint.  No neck base or axillary masses or adenopathy. No evidence of a mediastinal hematoma. The great vessels are unremarkable. Normal heart.  CT ABDOMEN AND PELVIS FINDINGS  Liver, spleen, gallbladder, pancreas, adrenal glands, kidneys, ureters, bladder: Unremarkable.  No bowel wall thickening to suggest a bowel hematoma. No evidence of a mesenteric hematoma. No evidence of a vascular injury. No fracture of the visualized spine. No pelvic fracture.  IMPRESSION: 1. Trauma to the left chest. There are numerous rib fractures, most of which are fractured in 2 locations. There is an associated posterior left lower lobe lung laceration with surrounding contusion/hemorrhage and a small effusion. Small areas of loculated pneumothorax are noted posteriorly, but there is no free or nondependent pneumothorax. Air has extended through rib fractures into the deep soft tissues of the posterior lateral left chest. 2. Comminuted left clavicle fracture. 3. Heart, mediastinum great vessels are unremarkable. No right lung or chest wall trauma. No vertebral fracture. 4. No acute findings below the diaphragm.   Electronically Signed   By: Lajean Manes M.D.   On: 06/04/2013 21:24   Dg Chest Port 1 View  06/05/2013   CLINICAL DATA:  Recent trauma with small pneumothorax  EXAM: PORTABLE CHEST - 1 VIEW  COMPARISON:  Chest radiograph and chest CT June 04, 2013  FINDINGS: There is a comminuted fracture of the left clavicle as well as multiple displaced  left-sided rib fractures. There is a small left base hydropneumothorax. There is no tension component. The small focus of subcutaneous air is again noted without progression. There is mild left base atelectasis. Elsewhere lungs are clear. Heart size and pulmonary vascularity are normal. No adenopathy.  IMPRESSION: Small left base hydropneumothorax tension component. There is left base atelectasis. There is minimal subcutaneous emphysema, not progressed from 1 day prior. Multiple rib fractures and left clavicle fracture on the left again noted. There old healed rib fractures on the right. Right lung is clear.   Electronically Signed   By: Lowella Grip M.D.   On: 06/05/2013 08:08   Dg Shoulder Left  06/04/2013   CLINICAL DATA:  Trauma and pain.  EXAM: LEFT SHOULDER - 2+ VIEW  COMPARISON:  None.  FINDINGS: Mild degenerative irregularity of the acromioclavicular and glenohumeral joints. No acute fracture or dislocation about the shoulder. Upper left rib fractures which are detailed on dedicated radiographs.  IMPRESSION: Degenerative change without acute finding about the left shoulder.  Upper left rib fractures.  Please see dedicated radiographs.   Electronically Signed   By: Abigail Miyamoto M.D.   On: 06/04/2013 20:19    Labs:  Recent Labs  06/04/13 1828  WBC 13.9*  RBC 4.36  HCT 43.4  PLT 212    Recent Labs  06/04/13 1828 06/04/13 1914  NA 142  --   K 4.2  --   CL 103  --   CO2 26  --   BUN 15  --   CREATININE 0.83 1.10  GLUCOSE 139*  --   CALCIUM 9.5  --    No results found for this basename: LABPT, INR,  in the last 72 hours  Review of Systems: Review of Systems  Musculoskeletal: Positive for joint pain.  Neurological: Negative for tingling.    Physical Exam: Sling on.  Clavicle tender.  NVI.  Assessment and Plan: Left comminuted displaced clavicle fracture.  Dr Rolena Infante will speak with one of the groups shoulder specialist's to get their treatment recommendations.   Question conservative management with immobilization vs operative with ORIF.  Would hold off on figure of 8 harness until we discuss with shoulder specialist especially with the rib fractures and pulmonary issues.  Will continue to follow.  Continue sling immobilizer for now.  Ice if needed.    Melina Schools, MD [place] [phone]

## 2013-06-05 NOTE — ED Provider Notes (Signed)
Medical screening examination/treatment/procedure(s) were conducted as a shared visit with non-physician practitioner(s) and myself.  I personally evaluated the patient during the encounter.  Fall from ladder about 6 feet.  No LOC.  ABCs intact. GCS15  TTP L shoulder, ribs, thoracic and lumbar spine.  Decreased breath sounds on L. No ecchymosis Abdomen soft and nontender.  Neuro intact.  Flail chest with multiple rib fractures.  Small PTX and hemothorax.  O2 saturations 96 on 2L.  BP and HR stable.  BP 107/65  Pulse 72  Temp(Src) 97.8 F (36.6 C) (Oral)  Resp 14  Ht 5' 9.5" (1.765 m)  Wt 151 lb 0.2 oz (68.5 kg)  BMI 21.99 kg/m2  SpO2 98%   EKG Interpretation   Date/Time:  Monday June 04 2013 18:12:32 EDT Ventricular Rate:  75 PR Interval:  164 QRS Duration: 88 QT Interval:  398 QTC Calculation: 444 R Axis:   80 Text Interpretation:  Normal sinus rhythm Septal infarct , age  undetermined Abnormal ECG No previous ECGs available Confirmed by Wyvonnia Dusky   MD, [place] [phone]) on 06/04/2013 6:45:40 PM     CRITICAL CARE Performed by: Ezequiel Essex Total critical care time: 35 Critical care time was exclusive of separately billable procedures and treating other patients. Critical care was necessary to treat or prevent imminent or life-threatening deterioration. Critical care was time spent personally by me on the following activities: development of treatment plan with patient and/or surrogate as well as nursing, discussions with consultants, evaluation of patient's response to treatment, examination of patient, obtaining history from patient or surrogate, ordering and performing treatments and interventions, ordering and review of laboratory studies, ordering and review of radiographic studies, pulse oximetry and re-evaluation of patient's condition.    Ezequiel Essex, MD 06/05/13 1148

## 2013-06-06 ENCOUNTER — Encounter (HOSPITAL_COMMUNITY): Payer: Self-pay | Admitting: Certified Registered"

## 2013-06-06 ENCOUNTER — Inpatient Hospital Stay (HOSPITAL_COMMUNITY): Payer: MEDICAID

## 2013-06-06 ENCOUNTER — Encounter (HOSPITAL_COMMUNITY): Payer: MEDICAID | Admitting: Certified Registered"

## 2013-06-06 ENCOUNTER — Inpatient Hospital Stay (HOSPITAL_COMMUNITY): Payer: Self-pay

## 2013-06-06 ENCOUNTER — Inpatient Hospital Stay (HOSPITAL_COMMUNITY): Payer: Self-pay | Admitting: Certified Registered"

## 2013-06-06 LAB — PROTIME-INR
INR: 1 (ref 0.00–1.49)
PROTHROMBIN TIME: 13 s (ref 11.6–15.2)

## 2013-06-06 LAB — BLOOD GAS, ARTERIAL
Acid-Base Excess: 4 mmol/L — ABNORMAL HIGH (ref 0.0–2.0)
BICARBONATE: 29.3 meq/L — AB (ref 20.0–24.0)
Drawn by: 28701
O2 Content: 1 L/min
O2 Saturation: 96 %
PH ART: 7.345 — AB (ref 7.350–7.450)
Patient temperature: 98.6
TCO2: 31 mmol/L (ref 0–100)
pCO2 arterial: 55.2 mmHg — ABNORMAL HIGH (ref 35.0–45.0)
pO2, Arterial: 85.6 mmHg (ref 80.0–100.0)

## 2013-06-06 MED ORDER — THIAMINE HCL 100 MG/ML IJ SOLN
100.0000 mg | Freq: Every day | INTRAMUSCULAR | Status: DC
  Administered 2013-06-06 – 2013-06-08 (×2): 100 mg via INTRAVENOUS
  Filled 2013-06-06 (×6): qty 1

## 2013-06-06 MED ORDER — VANCOMYCIN HCL IN DEXTROSE 1-5 GM/200ML-% IV SOLN
1000.0000 mg | Freq: Once | INTRAVENOUS | Status: AC
  Administered 2013-06-06: 1000 mg via INTRAVENOUS
  Filled 2013-06-06: qty 200

## 2013-06-06 MED ORDER — SODIUM CHLORIDE 0.9 % IJ SOLN
3.0000 mL | Freq: Two times a day (BID) | INTRAMUSCULAR | Status: DC
  Administered 2013-06-06 – 2013-06-11 (×9): 3 mL via INTRAVENOUS
  Administered 2013-06-12: 30 mL via INTRAVENOUS
  Administered 2013-06-12 – 2013-06-14 (×4): 3 mL via INTRAVENOUS

## 2013-06-06 MED ORDER — ROPIVACAINE HCL 2 MG/ML IJ SOLN
8.0000 mL/h | INTRAMUSCULAR | Status: DC
  Administered 2013-06-06: 8 mL/h via EPIDURAL
  Administered 2013-06-06: 10 mL/h via EPIDURAL
  Administered 2013-06-07 – 2013-06-10 (×4): 8 mL/h via EPIDURAL
  Filled 2013-06-06 (×15): qty 200

## 2013-06-06 MED ORDER — PIPERACILLIN-TAZOBACTAM 3.375 G IVPB
3.3750 g | Freq: Three times a day (TID) | INTRAVENOUS | Status: DC
  Administered 2013-06-06 – 2013-06-13 (×21): 3.375 g via INTRAVENOUS
  Filled 2013-06-06 (×22): qty 50

## 2013-06-06 MED ORDER — PNEUMOCOCCAL VAC POLYVALENT 25 MCG/0.5ML IJ INJ
0.5000 mL | INJECTION | INTRAMUSCULAR | Status: AC
  Administered 2013-06-09: 0.5 mL via INTRAMUSCULAR
  Filled 2013-06-06 (×2): qty 0.5

## 2013-06-06 MED ORDER — LIDOCAINE-EPINEPHRINE (PF) 1.5 %-1:200000 IJ SOLN
INTRAMUSCULAR | Status: DC | PRN
  Administered 2013-06-06 (×2): 5 mL

## 2013-06-06 MED ORDER — SODIUM CHLORIDE 0.9 % IJ SOLN: 3.0000 mL | INTRAMUSCULAR | Status: DC | PRN

## 2013-06-06 MED ORDER — IPRATROPIUM-ALBUTEROL 0.5-2.5 (3) MG/3ML IN SOLN
3.0000 mL | RESPIRATORY_TRACT | Status: DC | PRN
  Administered 2013-06-15: 3 mL via RESPIRATORY_TRACT
  Filled 2013-06-06 (×2): qty 3

## 2013-06-06 MED ORDER — LORAZEPAM 1 MG PO TABS: 1.0000 mg | ORAL_TABLET | Freq: Four times a day (QID) | ORAL | Status: AC | PRN

## 2013-06-06 MED ORDER — FOLIC ACID 1 MG PO TABS
1.0000 mg | ORAL_TABLET | Freq: Every day | ORAL | Status: DC
  Filled 2013-06-06 (×2): qty 1

## 2013-06-06 MED ORDER — LORAZEPAM 2 MG/ML IJ SOLN: 1.0000 mg | Freq: Four times a day (QID) | INTRAMUSCULAR | Status: AC | PRN

## 2013-06-06 MED ORDER — VANCOMYCIN HCL IN DEXTROSE 750-5 MG/150ML-% IV SOLN
750.0000 mg | Freq: Two times a day (BID) | INTRAVENOUS | Status: DC
  Administered 2013-06-07 – 2013-06-08 (×4): 750 mg via INTRAVENOUS
  Filled 2013-06-06 (×6): qty 150

## 2013-06-06 MED ORDER — IPRATROPIUM-ALBUTEROL 0.5-2.5 (3) MG/3ML IN SOLN
3.0000 mL | RESPIRATORY_TRACT | Status: DC
  Administered 2013-06-07 – 2013-06-14 (×47): 3 mL via RESPIRATORY_TRACT
  Filled 2013-06-06 (×47): qty 3

## 2013-06-06 MED ORDER — ADULT MULTIVITAMIN W/MINERALS CH
1.0000 | ORAL_TABLET | Freq: Every day | ORAL | Status: DC
  Administered 2013-06-07 – 2013-06-10 (×4): 1 via ORAL
  Filled 2013-06-06 (×5): qty 1

## 2013-06-06 MED ORDER — VITAMIN B-1 100 MG PO TABS
100.0000 mg | ORAL_TABLET | Freq: Every day | ORAL | Status: DC
  Administered 2013-06-07 – 2013-06-10 (×3): 100 mg via ORAL
  Filled 2013-06-06 (×6): qty 1

## 2013-06-06 NOTE — Evaluation (Signed)
Physical Therapy Evaluation Patient Details Name: James [name] MRN: [id number] DOB: [birth year] Today's Date: 06/06/2013   History of Present Illness  Patient is a 53 yo male suffered fall from ladder about 6 feet resulting in multiple rib fractures as well as left comminuted clavicle fracture, occult loculated pneumothorax and small left hemothorax.  Clinical Impression   demonstrates deficits in functional mobility as indicated below. Will benefit from continued skilled PT to address deficits and maximize function. Will see as indicated and progress as tolerated.    Follow Up Recommendations No PT follow up;Supervision/Assistance - 24 hour    Equipment Recommendations   (TBD)    Recommendations for Other Services       Precautions / Restrictions Precautions Precautions: Fall Required Braces or Orthoses: Sling (LUE)      Mobility  Bed Mobility Overal bed mobility: Needs Assistance Bed Mobility: Supine to Sit     Supine to sit: Min assist     General bed mobility comments: VCs for initiation of movement, increased time to perform, minimal assist to bring hips to EOB  Transfers Overall transfer level: Needs assistance Equipment used: 1 person hand held assist Transfers: Sit to/from Stand Sit to Stand: Min assist         General transfer comment: Assist for stability  Ambulation/Gait Ambulation/Gait assistance: Min assist Ambulation Distance (Feet): 8 Feet Assistive device: 1 person hand held assist          Stairs            Wheelchair Mobility    Modified Rankin (Stroke Patients Only)       Balance                                             Pertinent Vitals/Pain 8/10 in bed, 6/10 when in chair     Home Living Family/patient expects to be discharged to:: Private residence Living Arrangements: Alone Available Help at Discharge: Family;Friend(s) Type of Home: House Home Access: Stairs to enter Entrance Stairs-Rails:  Right Entrance Stairs-Number of Steps: 2 Home Layout: One level Home Equipment: None Additional Comments: tub shower with curtain, standard toilet    Prior Function Level of Independence: Independent               Hand Dominance   Dominant Hand: Right    Extremity/Trunk Assessment   Upper Extremity Assessment: Defer to OT evaluation           Lower Extremity Assessment: Overall WFL for tasks assessed         Communication      Cognition Arousal/Alertness: Awake/alert Behavior During Therapy: WFL for tasks assessed/performed Overall Cognitive Status: Within Functional Limits for tasks assessed                      General Comments      Exercises        Assessment/Plan    PT Assessment Patient needs continued PT services  PT Diagnosis Difficulty walking;Acute pain   PT Problem List Decreased range of motion;Decreased activity tolerance;Decreased balance;Decreased mobility;Cardiopulmonary status limiting activity;Pain  PT Treatment Interventions DME instruction;Gait training;Stair training;Functional mobility training;Therapeutic activities;Therapeutic exercise;Balance training;Patient/family education   PT Goals (Current goals can be found in the Care Plan section) Acute Rehab PT Goals Patient Stated Goal: to go home PT Goal Formulation: With patient Time For Goal Achievement: 06/20/13 Potential  to Achieve Goals: Good    Frequency Min 4X/week   Barriers to discharge Decreased caregiver support      Co-evaluation               End of Session Equipment Utilized During Treatment: Gait belt;Other (comment) (sling LUE) Activity Tolerance: Patient limited by pain Patient left: in chair;with call bell/phone within reach Nurse Communication: Mobility status         Time: 1610-9604 PT Time Calculation (min): 26 min   Charges:   PT Evaluation $Initial PT Evaluation Tier I: 1 Procedure PT Treatments $Therapeutic Activity: 8-22  mins   PT G Codes:          [name] 06/06/2013, 1:52 PM [name], [place] DPT  [phone]

## 2013-06-06 NOTE — Consult Note (Signed)
Agree with above Patient with displaced clavicle fx Films and case discussed with my partner Dr Onnie Graham Plan on sling for now - will have him f/u shortly after d/c with Dr Onnie Graham for ongoing fx management

## 2013-06-06 NOTE — Progress Notes (Signed)
ANTIBIOTIC CONSULT NOTE - INITIAL  Pharmacy Consult:  Vancomycin / Zosyn Indication:  HCAP  No Known Allergies  Patient Measurements: Height: 5' 9.5" (176.5 cm) Weight: 151 lb 0.2 oz (68.5 kg) IBW/kg (Calculated) : 71.85  Vital Signs: Temp: 98.1 F (36.7 C) (06/03 1540) Temp src: Axillary (06/03 1540) BP: 127/75 mmHg (06/03 1900) Pulse Rate: 103 (06/03 1900) Intake/Output from previous day: 06/02 0701 - 06/03 0700 In: 3000 [P.O.:1200; I.V.:1800] Out: 945 [Urine:945]  Labs:  Recent Labs  06/04/13 1828 06/04/13 1914  WBC 13.9*  --   HGB 14.5  --   PLT 212  --   CREATININE 0.83 1.10   Estimated Creatinine Clearance: 76.1 ml/min (by C-G formula based on Cr of 1.1). No results found for this basename: VANCOTROUGH, Corlis Leak, VANCORANDOM, [place], GENTPEAK, GENTRANDOM, TOBRATROUGH, TOBRAPEAK, TOBRARND, AMIKACINPEAK, AMIKACINTROU, AMIKACIN,  in the last 72 hours   Microbiology: Recent Results (from the past 720 hour(s))  MRSA PCR SCREENING     Status: None   Collection Time    06/05/13  1:08 AM      Result Value Ref Range Status   MRSA by PCR NEGATIVE  NEGATIVE Final   Comment:            The GeneXpert MRSA Assay (FDA     approved for NASAL specimens     only), is one component of a     comprehensive MRSA colonization     surveillance program. It is not     intended to diagnose MRSA     infection nor to guide or     monitor treatment for     MRSA infections.    Medical History: Past Medical History  Diagnosis Date  . Pneumothorax      Assessment: 32 YOM admitted 06/05/13 s/p falling off a ladder.  Pharmacy consulted to manage vancomycin and Zosyn for HCAP although patient has only been in the hospital for ~48 hours.  Baseline labs reviewed.   Goal of Therapy:  Vancomycin trough level 15-20 mcg/ml   Plan:  - Vanc 1gm IV x 1, then 750mg  IV Q12H - Zosyn 3.375gm IV Q8H, 4 hr infusion - Monitor renal fxn, clinical course, vanc trough as indicated - F/U  clinical course to de-escalate abx    Thaine [name] D. Mina Marble, PharmD, BCPS Pager:  [phone] 06/06/2013, 8:58 PM

## 2013-06-06 NOTE — Progress Notes (Signed)
Patient looks a bit fidgety.  IS up to 750 for me.  Productive cough.  Still lots of pain with breathing.  Will try to get anesthesia to place epidural catheter for pain control.  Has not gotten any Lovenox since yesterday.  Will check PT/INR  This patient has been seen and I agree with the findings and treatment plan.  Kathryne Eriksson. Dahlia Bailiff, MD, [place] [phone] (pager) [phone] (direct pager) Trauma Surgeon

## 2013-06-06 NOTE — Progress Notes (Signed)
Dr. Hulen Skains obtained, RN labelled/sent to lab.

## 2013-06-06 NOTE — Progress Notes (Signed)
Patient ID: James [name], male   DOB: 04-Nov-1960, 53 y.o.   MRN: [id number]  LOS: 2 days   Subjective: desats with [facility], on venti mask.    Objective: Vital signs in last 24 hours: Temp:  [98.7 F (37.1 C)-100.4 F (38 C)] 98.8 F (37.1 C) (06/03 0800) Pulse Rate:  [70-95] 77 (06/03 0800) Resp:  [13-26] 19 (06/03 0800) BP: (107-142)/(64-92) 130/69 mmHg (06/03 0800) SpO2:  [92 %-100 %] 96 % (06/03 0800) FiO2 (%):  [45 %-55 %] 55 % (06/03 0800) Last BM Date:  (PTA)  Lab Results:  CBC  Recent Labs  06/04/13 1828  WBC 13.9*  HGB 14.5  HCT 43.4  PLT 212   BMET  Recent Labs  06/04/13 1828 06/04/13 1914  NA 142  --   K 4.2  --   CL 103  --   CO2 26  --   GLUCOSE 139*  --   BUN 15  --   CREATININE 0.83 1.10  CALCIUM 9.5  --     Imaging: Dg Chest 1 View  06/04/2013   CLINICAL DATA:  History of trauma from a fall. Shortness of breath. Possible pneumothorax.  EXAM: CHEST - 1 VIEW  COMPARISON:  No priors.  FINDINGS: There are multiple displaced left-sided rib fractures involving the lateral aspect of the left second, third, fourth, fifth, sixth, seventh and eighth ribs. There appears to be a trace left-sided pneumothorax, and some gas in the subcutaneous tissues of the left chest wall. No acute consolidative airspace disease. No pleural effusions. No evidence of pulmonary edema. Heart size is normal. Mediastinal contours are unremarkable. Multiple old healed right-sided rib fractures posterolaterally are incidentally noted.  IMPRESSION: 1. Multiple acute mildly displaced left-sided rib fractures with trace left pneumothorax and a small amount of subcutaneous emphysema in the left chest wall. Critical Value/emergent results were called by telephone at the time of interpretation on 06/04/2013 at 8:20 PM to Dr. Ezequiel Essex, who verbally acknowledged these results.   Electronically Signed   By: Vinnie Langton M.D.   On: 06/04/2013 20:22   Dg Chest 2 View  06/06/2013   CLINICAL DATA:   eval PTX  EXAM: CHEST - 2 VIEW  COMPARISON:  the previous day's study  FINDINGS: There is a persistent left pleural effusion. Multiple displaced left rib fractures and a displaced left clavicle fracture as before. No definite pneumothorax component. Consolidation/ atelectasis in the left lower lobe. There is an enlarging left mid lung opacity having sharply defined medial and inferior margins, less well-defined superior and lateral margins, which may represent some fluid in the fissure versus focal pulmonary consolidation possibly contusion given the history of recent trauma.  Right lung remains clear. Multiple old right rib fractures. Heart size remains normal. .  IMPRESSION: 1. Persistent left pleural effusion and left lower lung consolidation/atelectasis. 2. More conspicuous left mid lung opacity may represent progressive focal contusion/consolidation or pseudotumor.   Electronically Signed   By: Arne Cleveland M.D.   On: 06/06/2013 08:56   Dg Pelvis 1-2 Views  06/04/2013   CLINICAL DATA:  Fall from ladder  EXAM: PELVIS - 1-2 VIEW  COMPARISON:  None.  FINDINGS: There is no evidence of pelvic fracture or diastasis. No other pelvic bone lesions are seen.  IMPRESSION: Negative.   Electronically Signed   By: Jacqulynn Cadet M.D.   On: 06/04/2013 20:16   Ct Head Wo Contrast  06/04/2013   CLINICAL DATA:  FALL  EXAM: CT HEAD WITHOUT  CONTRAST  TECHNIQUE: Contiguous axial images were obtained from the base of the skull through the vertex without intravenous contrast.  COMPARISON:  None.  FINDINGS: No acute intracranial abnormality. Specifically, no hemorrhage, hydrocephalus, mass lesion, acute infarction, or significant intracranial injury. No acute calvarial abnormality. The visualized paranasal sinuses and mastoid air cells are patent.  IMPRESSION: No acute intracranial abnormality.   Electronically Signed   By: Margaree Mackintosh M.D.   On: 06/04/2013 21:11   Ct Chest W Contrast  06/04/2013   CLINICAL DATA:   Fall.  Rib fractures.  EXAM: CT CHEST, ABDOMEN, AND PELVIS WITH CONTRAST  TECHNIQUE: Multidetector CT imaging of the chest, abdomen and pelvis was performed following the standard protocol during bolus administration of intravenous contrast.  CONTRAST:  144mL OMNIPAQUE IOHEXOL 300 MG/ML  SOLN  COMPARISON:  None.  FINDINGS: CT CHEST FINDINGS  There fractures of the first through ninth ribs on the left. The fracture of the first and second ribs are oblique along the posterior aspect of the ribs, minimally displaced. Fractures of the third through eighth ribs are both posterior hand lateral. Several of the lateral fractures are mildly displaced. The ninth rib fracture is lateral only.  There is opacity in the dependent left lower lobe with a small pleural effusion. There is a cystic area with an air-fluid level that extends for 8.5 cm from superior to inferior measuring 2.7 cm transversely, consistent with along lung laceration. Opacity adjacent to this is consistent with pulmonary contusion and atelectasis. There are additional small collections of air within this area of opacity consistent with small, loculated pneumothoraces. There is no evidence of a pneumothorax anteriorly or at the apex. There is significant paraseptal emphysema, which is greater on the left than the right. There is subcutaneous air adjacent to rib fractures along the left posterior lateral chest.  No right lung contusion or laceration.  No right pleural effusion.  There is also a displaced, mildly comminuted fracture of the midshaft of the left clavicle. Left shoulder joint is normally aligned as is the [facility] joint.  No neck base or axillary masses or adenopathy. No evidence of a mediastinal hematoma. The great vessels are unremarkable. Normal heart.  CT ABDOMEN AND PELVIS FINDINGS  Liver, spleen, gallbladder, pancreas, adrenal glands, kidneys, ureters, bladder: Unremarkable.  No bowel wall thickening to suggest a bowel hematoma. No evidence of a  mesenteric hematoma. No evidence of a vascular injury. No fracture of the visualized spine. No pelvic fracture.  IMPRESSION: 1. Trauma to the left chest. There are numerous rib fractures, most of which are fractured in 2 locations. There is an associated posterior left lower lobe lung laceration with surrounding contusion/hemorrhage and a small effusion. Small areas of loculated pneumothorax are noted posteriorly, but there is no free or nondependent pneumothorax. Air has extended through rib fractures into the deep soft tissues of the posterior lateral left chest. 2. Comminuted left clavicle fracture. 3. Heart, mediastinum great vessels are unremarkable. No right lung or chest wall trauma. No vertebral fracture. 4. No acute findings below the diaphragm.   Electronically Signed   By: Lajean Manes M.D.   On: 06/04/2013 21:24   Ct Cervical Spine Wo Contrast  06/04/2013   CLINICAL DATA:  Fall.  EXAM: CT CERVICAL SPINE WITHOUT CONTRAST  TECHNIQUE: Multidetector CT imaging of the cervical spine was performed without intravenous contrast. Multiplanar CT image reconstructions were also generated.  COMPARISON:  None.  FINDINGS: There is left first and second rib fractures which are  partially imaged. Emphysema and pneumothorax or bleb at the left apex, reference dedicated chest CT imaging. There is no evidence of cervical spine fracture or traumatic malalignment. Cervical spondylosis which is most notable in the mid and lower region, with disc narrowing and endplate spurring advanced at C5-6 and C6-7. Disc osteophyte complex at C5-6 likely contacts the ventral cord. Uncovertebral spurs at C6-7 cause moderate narrowing of the bilateral foramina.  IMPRESSION: 1. No evidence of acute cervical spine injury. 2. Left upper chest injury, reference dedicated chest CT. 3. Cervical spondylosis causing C5-6 moderate spinal canal stenosis.   Electronically Signed   By: Jorje Guild M.D.   On: 06/04/2013 22:26   Ct Abdomen Pelvis  W Contrast  06/04/2013   CLINICAL DATA:  Fall.  Rib fractures.  EXAM: CT CHEST, ABDOMEN, AND PELVIS WITH CONTRAST  TECHNIQUE: Multidetector CT imaging of the chest, abdomen and pelvis was performed following the standard protocol during bolus administration of intravenous contrast.  CONTRAST:  173mL OMNIPAQUE IOHEXOL 300 MG/ML  SOLN  COMPARISON:  None.  FINDINGS: CT CHEST FINDINGS  There fractures of the first through ninth ribs on the left. The fracture of the first and second ribs are oblique along the posterior aspect of the ribs, minimally displaced. Fractures of the third through eighth ribs are both posterior hand lateral. Several of the lateral fractures are mildly displaced. The ninth rib fracture is lateral only.  There is opacity in the dependent left lower lobe with a small pleural effusion. There is a cystic area with an air-fluid level that extends for 8.5 cm from superior to inferior measuring 2.7 cm transversely, consistent with along lung laceration. Opacity adjacent to this is consistent with pulmonary contusion and atelectasis. There are additional small collections of air within this area of opacity consistent with small, loculated pneumothoraces. There is no evidence of a pneumothorax anteriorly or at the apex. There is significant paraseptal emphysema, which is greater on the left than the right. There is subcutaneous air adjacent to rib fractures along the left posterior lateral chest.  No right lung contusion or laceration.  No right pleural effusion.  There is also a displaced, mildly comminuted fracture of the midshaft of the left clavicle. Left shoulder joint is normally aligned as is the [facility] joint.  No neck base or axillary masses or adenopathy. No evidence of a mediastinal hematoma. The great vessels are unremarkable. Normal heart.  CT ABDOMEN AND PELVIS FINDINGS  Liver, spleen, gallbladder, pancreas, adrenal glands, kidneys, ureters, bladder: Unremarkable.  No bowel wall thickening to  suggest a bowel hematoma. No evidence of a mesenteric hematoma. No evidence of a vascular injury. No fracture of the visualized spine. No pelvic fracture.  IMPRESSION: 1. Trauma to the left chest. There are numerous rib fractures, most of which are fractured in 2 locations. There is an associated posterior left lower lobe lung laceration with surrounding contusion/hemorrhage and a small effusion. Small areas of loculated pneumothorax are noted posteriorly, but there is no free or nondependent pneumothorax. Air has extended through rib fractures into the deep soft tissues of the posterior lateral left chest. 2. Comminuted left clavicle fracture. 3. Heart, mediastinum great vessels are unremarkable. No right lung or chest wall trauma. No vertebral fracture. 4. No acute findings below the diaphragm.   Electronically Signed   By: Lajean Manes M.D.   On: 06/04/2013 21:24   Dg Chest Port 1 View  06/05/2013   CLINICAL DATA:  Sob, hypoxia  EXAM: PORTABLE CHEST -  1 VIEW  COMPARISON:  Frontal view of the chest dated 06/05/2013  FINDINGS: The cardiac silhouette is within normal limits. Multiple rib fractures are appreciated on the left unchanged. The left lung base is not included on the study. The small hydro pneumothorax within the lung base is only partially visualized and appears stable. No further pneumothorax is appreciated. Linear areas of increased density are appreciated in left lung base consistent with atelectasis. The lungs otherwise clear. Distal left clavicle fracture with approximately 2-3 cm of override. Right lung is unremarkable.  IMPRESSION: No significant change in the chest radiograph compared to the previous study. The very small hydro pneumothorax within the left lung base appears stable and is only partially visualized. There is evidence of atelectasis within the left lung base.   Electronically Signed   By: Margaree Mackintosh M.D.   On: 06/05/2013 14:53   Dg Chest Port 1 View  06/05/2013   CLINICAL  DATA:  Recent trauma with small pneumothorax  EXAM: PORTABLE CHEST - 1 VIEW  COMPARISON:  Chest radiograph and chest CT June 04, 2013  FINDINGS: There is a comminuted fracture of the left clavicle as well as multiple displaced left-sided rib fractures. There is a small left base hydropneumothorax. There is no tension component. The small focus of subcutaneous air is again noted without progression. There is mild left base atelectasis. Elsewhere lungs are clear. Heart size and pulmonary vascularity are normal. No adenopathy.  IMPRESSION: Small left base hydropneumothorax tension component. There is left base atelectasis. There is minimal subcutaneous emphysema, not progressed from 1 day prior. Multiple rib fractures and left clavicle fracture on the left again noted. There old healed rib fractures on the right. Right lung is clear.   Electronically Signed   By: Lowella Grip M.D.   On: 06/05/2013 08:08   Dg Shoulder Left  06/04/2013   CLINICAL DATA:  Trauma and pain.  EXAM: LEFT SHOULDER - 2+ VIEW  COMPARISON:  None.  FINDINGS: Mild degenerative irregularity of the acromioclavicular and glenohumeral joints. No acute fracture or dislocation about the shoulder. Upper left rib fractures which are detailed on dedicated radiographs.  IMPRESSION: Degenerative change without acute finding about the left shoulder.  Upper left rib fractures.  Please see dedicated radiographs.   Electronically Signed   By: Abigail Miyamoto M.D.   On: 06/04/2013 20:19    PE:  General appearance: alert, cooperative and no distress  Resp: coarse throughout, expiratory wheezes Cardio: regular rate and rhythm, S1, S2 normal, no murmur, click, rub or gallop  GI: soft, non-tender; bowel sounds normal; no masses, no organomegaly  Extremities: extremities normal, atraumatic, no cyanosis or edema   Patient Active Problem List   Diagnosis Date Noted  . Fall from ladder 06/05/2013    Assessment/Plan:  Fall from ladder  Multiple left  sided rib fracture/HPTX-IS(pulling 750ml today, 1043ml yesterday). Hx tobacco use(1.5 packs since age 43), high risk of developing PNA, increasing oxygen demand.  ? Epidural for pain control, will hold lovenox dose this AM.    Left comminuted clavicle fracture-Dr. Rolena Infante following.  Sling for now, outpatient follow up with Dr. Onnie Graham VTE - SCD's, Lovenox  FEN - tolerating diet.  Will place on CIWA protocol Hx opioid dependence  Dispo -- continue ICU   Erby Pian, ANP-BC Pager: [phone] General Trauma [place] Pager: [phone]   06/06/2013 9:08 AM

## 2013-06-06 NOTE — Evaluation (Signed)
Occupational Therapy Evaluation Patient Details Name: James [name] MRN: [id number] DOB: 13-Aug-1960 Today's Date: 06/06/2013    History of Present Illness Patient is a 53 yo male suffered fall from ladder about 6 feet resulting in multiple rib fractures as well as left comminuted clavicle fracture, occult loculated pneumothorax and small left hemothorax.   Clinical Impression   Pt admitted with above. He demonstrates the below listed deficits and will benefit from continued OT to maximize safety and independence with BADLs.  Pt limited by pain on eval.  He is very motivated, but pain limits participation.  Currently, he requires mod - max A for BADLs.  02 sats 90% on venti mask.        Follow Up Recommendations  Supervision/Assistance - 24 hour;No OT follow up (OT follow up at MD discretion)    Equipment Recommendations  3 in 1 bedside comode    Recommendations for Other Services       Precautions / Restrictions Precautions Precautions: Fall Precaution Comments: Pt with displaced Lt. clavicle fracture.  No orders for Lt. UE.  Per orho notes, sling Lt. UE and follow up with them as OP Required Braces or Orthoses: Sling (LUE)      Mobility Bed Mobility Overal bed mobility: Needs Assistance Bed Mobility: Supine to Sit     Supine to sit: Min assist     General bed mobility comments: VCs for initiation of movement, increased time to perform, minimal assist to bring hips to EOB  Transfers Overall transfer level: Needs assistance Equipment used: 1 person hand held assist Transfers: Sit to/from Stand Sit to Stand: Min assist         General transfer comment: Assist for stability    Balance Overall balance assessment: No apparent balance deficits (not formally assessed)                                          ADL Overall ADL's : Needs assistance/impaired Eating/Feeding: Set up;Sitting   Grooming: Wash/dry hands;Wash/dry face;Oral care;Applying  deodorant;Minimal assistance;Sitting   Upper Body Bathing: Moderate assistance;Sitting   Lower Body Bathing: Sit to/from stand;Maximal assistance   Upper Body Dressing : Maximal assistance;Sitting   Lower Body Dressing: Total assistance;Sit to/from stand   Toilet Transfer: Min guard;Stand-pivot;Ambulation;Comfort height toilet   Toileting- Clothing Manipulation and Hygiene: Moderate assistance;Sit to/from stand       Functional mobility during ADLs: Minimal assistance General ADL Comments: Pt unable to access feet due to pain.      Vision                     Perception     Praxis      Pertinent Vitals/Pain Pain 10/10 Lt. Ribs.  Pt repositioned and RN notified     Hand Dominance Right   Extremity/Trunk Assessment Upper Extremity Assessment Upper Extremity Assessment: LUE deficits/detail LUE Deficits / Details: Not tested due to immobilization   Lower Extremity Assessment Lower Extremity Assessment: Defer to PT evaluation   Cervical / Trunk Assessment Cervical / Trunk Assessment: Normal   Communication     Cognition Arousal/Alertness: Awake/alert Behavior During Therapy: WFL for tasks assessed/performed Overall Cognitive Status: Within Functional Limits for tasks assessed                     General Comments       Exercises  Shoulder Instructions      Home Living Family/patient expects to be discharged to:: Private residence Living Arrangements: Alone Available Help at Discharge: Family;Friend(s) Type of Home: House Home Access: Stairs to enter [employer] of Steps: 2 Entrance Stairs-Rails: Right Home Layout: One level     Bathroom Shower/Tub: Tub/shower unit Shower/tub characteristics: Architectural technologist: Standard     Home Equipment: None   Additional Comments: Pt states he may stay with sister post d/c      Prior Functioning/Environment Level of Independence: Independent             OT  Diagnosis: Generalized weakness;Acute pain   OT Problem List: Decreased strength;Decreased activity tolerance;Decreased knowledge of use of DME or AE;Decreased knowledge of precautions;Cardiopulmonary status limiting activity;Impaired UE functional use;Pain   OT Treatment/Interventions: Self-care/ADL training;DME and/or AE instruction;Therapeutic activities;Patient/family education;Therapeutic exercise    OT Goals(Current goals can be found in the care plan section) Acute Rehab OT Goals Patient Stated Goal: to go home OT Goal Formulation: With patient Time For Goal Achievement: 06/20/13 Potential to Achieve Goals: Good ADL Goals Pt Will Perform Grooming: with supervision;standing Pt Will Perform Upper Body Bathing: with supervision;sitting;with set-up Pt Will Perform Lower Body Bathing: with supervision;with adaptive equipment;sit to/from stand Pt Will Perform Upper Body Dressing: with supervision;sitting Pt Will Perform Lower Body Dressing: with supervision;sit to/from stand;with adaptive equipment Pt Will Transfer to Toilet: with supervision;ambulating;regular height toilet;bedside commode;grab bars Pt Will Perform Toileting - Clothing Manipulation and hygiene: with supervision;sit to/from stand Additional ADL Goal #1: Pt will be independent with sling wear and care  OT Frequency: Min 2X/week   Barriers to D/C: Decreased caregiver support          Co-evaluation              End of Session Equipment Utilized During Treatment: Other (comment) (sling) Nurse Communication: Patient requests pain meds  Activity Tolerance: Patient limited by pain Patient left: in chair;with call bell/phone within reach   Time: 1412-1449 OT Time Calculation (min): 37 min Charges:  OT General Charges $OT Visit: 1 Procedure OT Evaluation $Initial OT Evaluation Tier I: 1 Procedure OT Treatments $Therapeutic Activity: 23-37 mins G-Codes:    James [name] James [name] 06/09/13, 3:02 PM

## 2013-06-06 NOTE — Anesthesia Procedure Notes (Signed)
Epidural Patient location during procedure: ICU Start time: 06/06/2013 6:28 PM End time: 06/06/2013 6:50 PM  Staffing Anesthesiologist: Rolando Whitby, CHRIS Performed by: anesthesiologist   Preanesthetic Checklist Completed: patient identified, surgical consent, pre-op evaluation, timeout performed, IV checked, risks and benefits discussed and monitors and equipment checked  Epidural Patient position: sitting Prep: site prepped and draped and DuraPrep Patient monitoring: heart rate, cardiac monitor, continuous pulse ox and blood pressure Approach: midline Location: thoracic (1-12) Injection technique: LOR saline  Needle:  Needle type: Tuohy  Needle gauge: 17 G Needle length: 9 cm Needle insertion depth: 7 cm Catheter type: closed end flexible Catheter size: 19 Gauge Catheter at skin depth: 13 cm Test dose: 1.5% lidocaine with Epi 1:200 K  Assessment Events: blood not aspirated, injection not painful, no injection resistance, negative IV test and no paresthesia  Additional Notes H+P and labs checked, risks and benefits discussed with the patient, consent obtained, procedure tolerated well and without complications.  Reason for block:procedure for pain

## 2013-06-06 NOTE — Anesthesia Preprocedure Evaluation (Signed)
Anesthesia Evaluation  Patient identified by MRN, date of birth, ID band Patient awake    Reviewed: Allergy & Precautions, H&P , NPO status , Patient's Chart, lab work & pertinent test results  History of Anesthesia Complications Negative for: history of anesthetic complications  Airway Mallampati: II TM Distance: >3 FB Neck ROM: Full    Dental  (+) Teeth Intact   Pulmonary Current Smoker,  Left 1 -9 rib fractures with left clavicle fracture, Poor respiratory function secondary to pain   + decreased breath sounds  rales    Cardiovascular negative cardio ROS  Rhythm:Regular Rate:Tachycardia     Neuro/Psych negative neurological ROS  negative psych ROS   GI/Hepatic negative GI ROS, Neg liver ROS,   Endo/Other  negative endocrine ROS  Renal/GU negative Renal ROS     Musculoskeletal negative musculoskeletal ROS (+)   Abdominal   Peds  Hematology negative hematology ROS (+)   Anesthesia Other Findings   Reproductive/Obstetrics                           Anesthesia Physical Anesthesia Plan  ASA: III  Anesthesia Plan: Epidural   Post-op Pain Management:    Induction:   Airway Management Planned:   Additional Equipment:   Intra-op Plan:   Post-operative Plan:   Informed Consent: I have reviewed the patients History and Physical, chart, labs and discussed the procedure including the risks, benefits and alternatives for the proposed anesthesia with the patient or authorized representative who has indicated his/her understanding and acceptance.     Plan Discussed with: Anesthesiologist  Anesthesia Plan Comments:         Anesthesia Quick Evaluation

## 2013-06-07 ENCOUNTER — Inpatient Hospital Stay (HOSPITAL_COMMUNITY): Payer: Self-pay

## 2013-06-07 ENCOUNTER — Inpatient Hospital Stay (HOSPITAL_COMMUNITY): Payer: Self-pay | Admitting: Critical Care Medicine

## 2013-06-07 ENCOUNTER — Encounter (HOSPITAL_COMMUNITY): Payer: MEDICAID | Admitting: Critical Care Medicine

## 2013-06-07 DIAGNOSIS — J9819 Other pulmonary collapse: Secondary | ICD-10-CM

## 2013-06-07 DIAGNOSIS — J95821 Acute postprocedural respiratory failure: Secondary | ICD-10-CM

## 2013-06-07 LAB — BLOOD GAS, ARTERIAL
Acid-Base Excess: 6.7 mmol/L — ABNORMAL HIGH (ref 0.0–2.0)
BICARBONATE: 32.4 meq/L — AB (ref 20.0–24.0)
Drawn by: 295031
FIO2: 1 %
MECHVT: 550 mL
O2 Saturation: 90.2 %
PCO2 ART: 62.8 mmHg — AB (ref 35.0–45.0)
PEEP: 5 cmH2O
PH ART: 7.333 — AB (ref 7.350–7.450)
PO2 ART: 62.6 mmHg — AB (ref 80.0–100.0)
Patient temperature: 98.6
RATE: 16 resp/min
TCO2: 34.3 mmol/L (ref 0–100)

## 2013-06-07 LAB — TRIGLYCERIDES: Triglycerides: 60 mg/dL (ref <150)

## 2013-06-07 MED ORDER — PROPOFOL 10 MG/ML IV EMUL
0.0000 ug/kg/min | INTRAVENOUS | Status: DC
Start: 2013-06-07 — End: 2013-06-10
  Administered 2013-06-07 – 2013-06-08 (×3): 20 ug/kg/min via INTRAVENOUS
  Administered 2013-06-08: 15 ug/kg/min via INTRAVENOUS
  Administered 2013-06-09 – 2013-06-10 (×3): 10 ug/kg/min via INTRAVENOUS
  Filled 2013-06-07 (×6): qty 100

## 2013-06-07 MED ORDER — PROPOFOL 10 MG/ML IV BOLUS
INTRAVENOUS | Status: DC | PRN
  Administered 2013-06-07: 170 mg via INTRAVENOUS

## 2013-06-07 MED ORDER — SODIUM CHLORIDE 0.9 % IV SOLN
50.0000 ug/h | INTRAVENOUS | Status: DC
  Administered 2013-06-07: 50 ug/h via INTRAVENOUS
  Administered 2013-06-08 – 2013-06-10 (×4): 150 ug/h via INTRAVENOUS
  Administered 2013-06-12 (×2): 200 ug/h via INTRAVENOUS
  Filled 2013-06-07 (×10): qty 50

## 2013-06-07 MED ORDER — FOLIC ACID 5 MG/ML IJ SOLN
1.0000 mg | Freq: Every day | INTRAMUSCULAR | Status: DC
  Administered 2013-06-08 – 2013-06-18 (×10): 1 mg via INTRAVENOUS
  Filled 2013-06-07 (×12): qty 0.2

## 2013-06-07 MED ORDER — HEPARIN SODIUM (PORCINE) 5000 UNIT/ML IJ SOLN
5000.0000 [IU] | Freq: Three times a day (TID) | INTRAMUSCULAR | Status: AC
  Administered 2013-06-07 – 2013-06-10 (×11): 5000 [IU] via SUBCUTANEOUS
  Filled 2013-06-07 (×14): qty 1

## 2013-06-07 MED ORDER — BIOTENE DRY MOUTH MT LIQD
15.0000 mL | Freq: Four times a day (QID) | OROMUCOSAL | Status: DC
  Administered 2013-06-07 – 2013-06-14 (×25): 15 mL via OROMUCOSAL

## 2013-06-07 MED ORDER — PANTOPRAZOLE SODIUM 40 MG IV SOLR
40.0000 mg | Freq: Every day | INTRAVENOUS | Status: DC
Start: 2013-06-07 — End: 2013-06-10
  Administered 2013-06-07 – 2013-06-09 (×3): 40 mg via INTRAVENOUS
  Filled 2013-06-07 (×4): qty 40

## 2013-06-07 MED ORDER — SUCCINYLCHOLINE CHLORIDE 20 MG/ML IJ SOLN
INTRAMUSCULAR | Status: DC | PRN
  Administered 2013-06-07: 120 mg via INTRAVENOUS

## 2013-06-07 MED ORDER — FENTANYL CITRATE 0.05 MG/ML IJ SOLN
100.0000 ug | INTRAMUSCULAR | Status: DC | PRN
  Administered 2013-06-07 – 2013-06-11 (×3): 100 ug via INTRAVENOUS
  Filled 2013-06-07 (×2): qty 2

## 2013-06-07 MED ORDER — PROPOFOL 10 MG/ML IV EMUL
INTRAVENOUS | Status: AC
  Filled 2013-06-07: qty 100

## 2013-06-07 MED ORDER — KCL IN DEXTROSE-NACL 20-5-0.45 MEQ/L-%-% IV SOLN
INTRAVENOUS | Status: DC
  Administered 2013-06-07 (×2): via INTRAVENOUS
  Filled 2013-06-07 (×4): qty 1000

## 2013-06-07 MED ORDER — ACETAMINOPHEN 650 MG RE SUPP: 650.0000 mg | Freq: Four times a day (QID) | RECTAL | Status: DC | PRN

## 2013-06-07 MED ORDER — CHLORHEXIDINE GLUCONATE 0.12 % MT SOLN
15.0000 mL | Freq: Two times a day (BID) | OROMUCOSAL | Status: DC
  Administered 2013-06-07 – 2013-06-13 (×11): 15 mL via OROMUCOSAL
  Filled 2013-06-07 (×11): qty 15

## 2013-06-07 MED ORDER — SODIUM CHLORIDE 0.9 % IV SOLN
1.0000 mg | Freq: Every day | INTRAVENOUS | Status: DC
  Administered 2013-06-07: 1 mg via INTRAVENOUS
  Filled 2013-06-07: qty 0.2

## 2013-06-07 NOTE — Progress Notes (Signed)
Epidural Progress Note:  53 y.o. male with T1-9 left rib and left clavicle fractures s/p placement of a T5/6 epidural on 6/3.  Filed Vitals:   06/07/13 0730  BP: 115/65  Pulse: 82  Temp:   Resp: 14   Physical Examination:  General appearance - alert,mild distress Mental status - alert, oriented to person, place, and time Chest - decreased air movement with diffuse crackle in left field, faint crackles in right field  Heart - normal rate and regular rhythm Neurological - motor and sensory exam appropriate for block Epidural site: dressing clean, dry, intact    Assessment Despite improved pain control patient developed diffuse left pulmonary edema and infiltrates in the setting of multiple left sided traumatic rib fractures requiring intubation this morning.   Plan Will continue epidural infusion at 10 ml/hr to control pain related to rib fractures. Consider scheduled tylenol and toradol as adjuncts to epidural and for clavicular fracture in addition to as needed narcotics. DVT prophylaxis with heparin sq is acceptable, however continue to hold lovenox while epidural in place. Please call Anesthesiologist on call with further questions or concerns.   Laurie Panda, MD 06/07/2013 9:01 AM

## 2013-06-07 NOTE — Progress Notes (Signed)
CXR completed. Central line and OG tube (for medications) OK to use per Dr. Grandville Silos.

## 2013-06-07 NOTE — Procedures (Signed)
Bedside Bronchoscopy Procedure Note James [name] [id number] 13-Jan-1960  Procedure: Bronchoscopy Indications: left lung full of secretions.  Procedure Details: ET Tube Size: ET Tube secured at lip 24(cm): Bite block in place: no  preparation for procedure, time out called. Airway entered and the following bronchi were examined: left lobes.     Evaluation BP 142/77  Pulse 81  Temp(Src) 98.7 F (37.1 C) (Axillary)  Resp 23  Ht 5\' 9"  (1.753 m)  Wt 154 lb 8.7 oz (70.1 kg)  BMI 22.81 kg/m2  SpO2 98% Breath Sounds: rhonchi O2 sats: 98 Patient's Current Condition: stable Specimens: BAL complications: none Patient tolerated procedure well.   [name] 06/07/2013, 12:21 PM

## 2013-06-07 NOTE — Progress Notes (Addendum)
Patient ID: James [name], male   DOB: 11/18/1960, 53 y.o.   MRN: [id number] Follow up - Trauma Critical Care  Patient Details:    James [name] is an 53 y.o. male.  Lines/tubes : Epidural Catheter (Active)  Site Assessment Clean;Dry;Intact 06/07/2013  8:00 AM  Line Status Infusing 06/07/2013  8:00 AM  Dressing Type Transparent;Occlusive 06/07/2013  8:00 AM  Dressing Status Clean;Dry;Intact 06/07/2013  8:00 AM    Microbiology/Sepsis markers: Results for orders placed during the hospital encounter of 06/04/13  MRSA PCR SCREENING     Status: None   Collection Time    06/05/13  1:08 AM      Result Value Ref Range Status   MRSA by PCR NEGATIVE  NEGATIVE Final   Comment:            The GeneXpert MRSA Assay (FDA     approved for NASAL specimens     only), is one component of a     comprehensive MRSA colonization     surveillance program. It is not     intended to diagnose MRSA     infection nor to guide or     monitor treatment for     MRSA infections.  CULTURE, RESPIRATORY (NON-EXPECTORATED)     Status: None   Collection Time    06/06/13  6:21 PM      Result Value Ref Range Status   Specimen Description TRACHEAL ASPIRATE   Final   Special Requests NONE   Final   Gram Stain     Final   Value: RARE WBC PRESENT,BOTH PMN AND MONONUCLEAR     FEW SQUAMOUS EPITHELIAL CELLS PRESENT     ABUNDANT GRAM POSITIVE RODS     FEW GRAM POSITIVE COCCI IN PAIRS     RARE GRAM NEGATIVE RODS   Culture PENDING   Incomplete   Report Status PENDING   Incomplete    Anti-infectives:  Anti-infectives   Start     Dose/Rate Route Frequency Ordered Stop   06/07/13 1000  vancomycin (VANCOCIN) IVPB 750 mg/150 ml premix     750 mg 150 mL/hr over 60 Minutes Intravenous Every 12 hours 06/06/13 2100     06/06/13 2200  piperacillin-tazobactam (ZOSYN) IVPB 3.375 g     3.375 g 12.5 mL/hr over 240 Minutes Intravenous Every 8 hours 06/06/13 2100     06/06/13 2115  vancomycin (VANCOCIN) IVPB 1000 mg/200 mL premix      1,000 mg 200 mL/hr over 60 Minutes Intravenous  Once 06/06/13 2100 06/06/13 2256      Consults: Treatment Team:  Melina Schools, MD    Studies: CXR - There is now complete opacification of the left hemi thorax   Subjective:    Overnight Issues: worsening resp failure and CXR  Objective:  Vital signs for last 24 hours: Temp:  [98.1 F (36.7 C)-100 F (37.8 C)] 98.7 F (37.1 C) (06/04 0306) Pulse Rate:  [74-119] 75 (06/04 0700) Resp:  [12-33] 16 (06/04 0700) BP: (99-162)/(46-92) 125/63 mmHg (06/04 0700) SpO2:  [88 %-100 %] 95 % (06/04 0700) FiO2 (%):  [55 %-100 %] 97 % (06/03 2200)  Hemodynamic parameters for last 24 hours:    Intake/Output from previous day: 06/03 0701 - 06/04 0700 In: 882.5 [P.O.:340; I.V.:242.5; IV Piggyback:300] Out: 450 [Urine:450]  Intake/Output this shift:    Vent settings for last 24 hours: Vent Mode:  [-]  FiO2 (%):  [55 %-100 %] 97 %  Physical Exam:  General: alert  Neuro: F/C well HEENT/Neck: no JVD Resp: no BS on L, cough minimal CVS: RRR GI: soft, NT Extremities: calves soft  Results for orders placed during the hospital encounter of 06/04/13 (from the past 24 hour(s))  PROTIME-INR     Status: None   Collection Time    06/06/13  9:55 AM      Result Value Ref Range   Prothrombin Time 13.0  11.6 - 15.2 seconds   INR 1.00  0.00 - 1.49  CULTURE, RESPIRATORY (NON-EXPECTORATED)     Status: None   Collection Time    06/06/13  6:21 PM      Result Value Ref Range   Specimen Description TRACHEAL ASPIRATE     Special Requests NONE     Gram Stain       Value: RARE WBC PRESENT,BOTH PMN AND MONONUCLEAR     FEW SQUAMOUS EPITHELIAL CELLS PRESENT     ABUNDANT GRAM POSITIVE RODS     FEW GRAM POSITIVE COCCI IN PAIRS     RARE GRAM NEGATIVE RODS   Culture PENDING     Report Status PENDING    BLOOD GAS, ARTERIAL     Status: Abnormal   Collection Time    06/06/13  8:50 PM      Result Value Ref Range   O2 Content 1.0     Delivery  systems NON-REBREATHER OXYGEN MASK     pH, Arterial 7.345 (*) 7.350 - 7.450   pCO2 arterial 55.2 (*) 35.0 - 45.0 mmHg   pO2, Arterial 85.6  80.0 - 100.0 mmHg   Bicarbonate 29.3 (*) 20.0 - 24.0 mEq/L   TCO2 31.0  0 - 100 mmol/L   Acid-Base Excess 4.0 (*) 0.0 - 2.0 mmol/L   O2 Saturation 96.0     Patient temperature 98.6     Collection site RIGHT RADIAL     Drawn by [id number]     Sample type ARTERIAL DRAW     Allens test (pass/fail) PASS  PASS    Assessment & Plan: Present on Admission:  . Fall from ladder   LOS: 3 days   Additional comments:I reviewed the patient's new clinical lab test results. and CXR Fall from ladder  Multiple left sided rib fracture/HPTX - severe ATX has progressed on L now with no BS there and complete opacification L lung. Will intubate and proceed with bronchoscopy. Procedure, risks, benefits D/W patient and he agrees. Left comminuted clavicle fracture-Dr. Rolena Infante following.  Sling for now, outpatient follow up with Dr. Onnie Graham VTE - SCD's, Lovenox  ID - Vanc Zosyn empiric, send BAL during bronch FEN - NPO for intubaiton Hx opioid dependence  Dispo -- continue ICU Critical Care Total Time*: 45 Minutes  Georganna Skeans, MD, MPH, FACS Trauma: [phone] General Surgery: [phone]  06/07/2013  *Care during the described time interval was provided by me. I have reviewed this patient's available data, including medical history, events of note, physical examination and test results as part of my evaluation.

## 2013-06-07 NOTE — Procedures (Signed)
Intubation Procedure Note JUSTN QUALE [id number] 1960/09/05  Procedure: Intubation Indications: to protect airway Procedure Details Consent: recieved Time Out: Verified patient identification, verified procedure, site/side was marked, verified correct patient position, special equipment/implants available, medications/allergies/relevent history reviewed, required imaging and test results available.  time out performed:0835  Maximum sterile technique was used.   Hemodynamic Status: stable throughout O2 sats:95  patient's Current Condition: stable Complications:  patient tolerated procedure well. Chest X-ray ordered to verify placement.  CXR: placement confirmed.   James [name] 06/07/2013

## 2013-06-07 NOTE — Procedures (Signed)
Kalid Ghan, MD, MPH, FACS Trauma: [phone] General Surgery: [phone]  

## 2013-06-07 NOTE — Progress Notes (Signed)
Called to ICU for intubation of patient in respiratory decline.  He is s/p multiple left rib fractures from a fall off a ladder.  Upon arrival, SaO2 is 90% on a non-rebreather.  He is only able to take small, shallow breaths.  Thoracic epidural was placed yesterday.  Pt understands medical need for ETT placement and agrees.  Pt was induced with 160mg  propofol and 120mg  succinylcholine.  8.0 mm subglottic ETT was placed without difficulty using a miller blade laryngoscope.  Pt remained stable throughout.  Trauma MD at bedside.  No complications.

## 2013-06-07 NOTE — Procedures (Signed)
Bronchoscopy Procedure Note [name] [id number] 27-Dec-1960  Procedure: Bronchoscopy Indications: Remove secretions  Procedure Details Consent: Risks of procedure as well as the alternatives and risks of each were explained to the (patient/caregiver).  Consent for procedure obtained. Time Out: Verified patient identification, verified procedure, site/side was marked, verified correct patient position, special equipment/implants available, medications/allergies/relevent history reviewed, required imaging and test results available.  Performed  In preparation for procedure, patient was given 100% FiO2 and bronchoscope lubricated. Sedation: propofol  Airway entered and the following bronchi were examined: RUL, RML, RLL, LUL, LLL and Bronchi.   Procedures performed: Brushings performed Bronchoscope removed.    Evaluation Hemodynamic Status: BP stable throughout; O2 sats: stable throughout Patient's Current Condition: stable Specimens:  Sent purulent fluid Complications: No apparent complications Patient did tolerate procedure well. CXR P  James [name] 06/07/2013

## 2013-06-07 NOTE — Progress Notes (Signed)
PT Cancellation Note  Patient Details Name: James [name] MRN: [id number] DOB: 1960-11-18   Cancelled Treatment:    Reason Eval/Treat Not Completed: Medical issues which prohibited therapy (intubated this am)   [name] 06/07/2013, 3:51 PM [name], [place] DPT  [phone]

## 2013-06-07 NOTE — Anesthesia Postprocedure Evaluation (Signed)
  Anesthesia Post-op Note  [name]  Procedure(s) Performed: * No procedures listed *  Patient Location: ICU  Anesthesia Type:General  Level of Consciousness: Patient remains intubated per anesthesia plan  Airway and Oxygen Therapy: Patient remains intubated per anesthesia plan and Patient placed on Ventilator (see vital sign flow sheet for setting)  Post-op Pain: none  Post-op Assessment: Post-op Vital signs reviewed, Patient's Cardiovascular Status Stable, Respiratory Function Stable and Patent Airway  Post-op Vital Signs: Reviewed and stable  Last Vitals:  Filed Vitals:   06/07/13 0800  BP: 142/77  Pulse: 81  Temp:   Resp: 23    Complications: No apparent anesthesia complications

## 2013-06-07 NOTE — Transfer of Care (Signed)
Immediate Anesthesia Transfer of Care Note  [name]  Procedure(s) Performed: intubation by anesthesia  Patient Location: ICU  Anesthesia Type:General  Level of Consciousness: sedated and Patient remains intubated per anesthesia plan  Airway & Oxygen Therapy: Patient placed on Ventilator (see vital sign flow sheet for setting)  Post-op Assessment: Post -op Vital signs reviewed and stable  Post vital signs: Reviewed and stable  Complications: No apparent anesthesia complications

## 2013-06-07 NOTE — Procedures (Signed)
Central Venous Catheter Insertion Procedure Note James [name] [id number] [birth year]  Procedure: Insertion of Central Venous Catheter Indications: Drug and/or fluid administration and Frequent blood sampling  Procedure Details Consent: Risks of procedure as well as the alternatives and risks of each were explained to the (patient/caregiver).  Consent for procedure obtained. Time Out: Verified patient identification, verified procedure, site/side was marked, verified correct patient position, special equipment/implants available, medications/allergies/relevent history reviewed, required imaging and test results available.  Performed  Maximum sterile technique was used including antiseptics, cap, gloves, gown, hand hygiene, mask and sheet. Skin prep: Chlorhexidine; local anesthetic administered A antimicrobial bonded/coated triple lumen catheter was placed in the right subclavian vein using the Seldinger technique.  Evaluation Blood flow good Complications: No apparent complications Patient did tolerate procedure well. Chest X-ray ordered to verify placement.  CXR: pending.    Lisette Abu, PA-C Pager: [phone] General Trauma PA Pager: [phone] 06/07/2013, 12:26 PM

## 2013-06-08 ENCOUNTER — Inpatient Hospital Stay (HOSPITAL_COMMUNITY): Payer: Self-pay

## 2013-06-08 DIAGNOSIS — J9621 Acute and chronic respiratory failure with hypoxia: Secondary | ICD-10-CM

## 2013-06-08 DIAGNOSIS — J9600 Acute respiratory failure, unspecified whether with hypoxia or hypercapnia: Secondary | ICD-10-CM

## 2013-06-08 DIAGNOSIS — S2249XA Multiple fractures of ribs, unspecified side, initial encounter for closed fracture: Secondary | ICD-10-CM

## 2013-06-08 DIAGNOSIS — J189 Pneumonia, unspecified organism: Secondary | ICD-10-CM

## 2013-06-08 DIAGNOSIS — R4182 Altered mental status, unspecified: Secondary | ICD-10-CM

## 2013-06-08 DIAGNOSIS — J9620 Acute and chronic respiratory failure, unspecified whether with hypoxia or hypercapnia: Secondary | ICD-10-CM

## 2013-06-08 HISTORY — DX: Acute and chronic respiratory failure with hypoxia: J96.21

## 2013-06-08 LAB — CBC
HEMATOCRIT: 35.4 % — AB (ref 39.0–52.0)
Hemoglobin: 11.9 g/dL — ABNORMAL LOW (ref 13.0–17.0)
MCH: 34 pg (ref 26.0–34.0)
MCHC: 33.6 g/dL (ref 30.0–36.0)
MCV: 101.1 fL — ABNORMAL HIGH (ref 78.0–100.0)
Platelets: 172 10*3/uL (ref 150–400)
RBC: 3.5 MIL/uL — ABNORMAL LOW (ref 4.22–5.81)
RDW: 12.4 % (ref 11.5–15.5)
WBC: 12.4 10*3/uL — AB (ref 4.0–10.5)

## 2013-06-08 LAB — BLOOD GAS, ARTERIAL
Acid-Base Excess: 7.3 mmol/L — ABNORMAL HIGH (ref 0.0–2.0)
Bicarbonate: 32.2 mEq/L — ABNORMAL HIGH (ref 20.0–24.0)
Drawn by: 29017
FIO2: 0.6 %
LHR: 20 {breaths}/min
O2 Saturation: 96.9 %
PCO2 ART: 53.3 mmHg — AB (ref 35.0–45.0)
PEEP/CPAP: 5 cmH2O
PH ART: 7.398 (ref 7.350–7.450)
PO2 ART: 90.9 mmHg (ref 80.0–100.0)
Patient temperature: 98.6
TCO2: 33.8 mmol/L (ref 0–100)
VT: 550 mL

## 2013-06-08 LAB — BASIC METABOLIC PANEL
BUN: 22 mg/dL (ref 6–23)
CHLORIDE: 97 meq/L (ref 96–112)
CO2: 32 mEq/L (ref 19–32)
Calcium: 9.1 mg/dL (ref 8.4–10.5)
Creatinine, Ser: 0.75 mg/dL (ref 0.50–1.35)
GFR calc non Af Amer: >90 mL/min (ref >90)
Glucose, Bld: 145 mg/dL — ABNORMAL HIGH (ref 70–99)
POTASSIUM: 5 meq/L (ref 3.7–5.3)
Sodium: 138 mEq/L (ref 137–147)

## 2013-06-08 LAB — CULTURE, RESPIRATORY

## 2013-06-08 LAB — POCT I-STAT 3, ART BLOOD GAS (G3+)
Acid-Base Excess: 7 mmol/L — ABNORMAL HIGH (ref 0.0–2.0)
Bicarbonate: 34.1 meq/L — ABNORMAL HIGH (ref 20.0–24.0)
O2 SAT: 99 %
TCO2: 36 mmol/L (ref 0–100)
pCO2 arterial: 61.3 mmHg (ref 35.0–45.0)
pH, Arterial: 7.356 (ref 7.350–7.450)
pO2, Arterial: 159 mmHg — ABNORMAL HIGH (ref 80.0–100.0)

## 2013-06-08 LAB — GLUCOSE, CAPILLARY
GLUCOSE-CAPILLARY: 132 mg/dL — AB (ref 70–99)
Glucose-Capillary: 116 mg/dL — ABNORMAL HIGH (ref 70–99)
Glucose-Capillary: 147 mg/dL — ABNORMAL HIGH (ref 70–99)

## 2013-06-08 LAB — CULTURE, RESPIRATORY W GRAM STAIN

## 2013-06-08 MED ORDER — PIVOT 1.5 CAL PO LIQD
1000.0000 mL | ORAL | Status: DC
  Filled 2013-06-08 (×2): qty 1000

## 2013-06-08 MED ORDER — PIVOT 1.5 CAL PO LIQD
1000.0000 mL | ORAL | Status: DC
  Administered 2013-06-08 – 2013-06-10 (×3): 1000 mL
  Filled 2013-06-08 (×6): qty 1000

## 2013-06-08 MED ORDER — DEXTROSE-NACL 5-0.45 % IV SOLN
INTRAVENOUS | Status: DC
  Administered 2013-06-08 – 2013-06-09 (×3): via INTRAVENOUS
  Administered 2013-06-10: 52.9 mL via INTRAVENOUS
  Administered 2013-06-11 – 2013-06-12 (×5): via INTRAVENOUS

## 2013-06-08 NOTE — Progress Notes (Signed)
Orthopedic Tech Progress Note Patient Details:  James [name] 1960-01-05 [id number] Clavicle strap at bed side did not no if strap would be affective if applied, waiting talk to doctor to see if they still want it. Patient ID: James [name], male   DOB: Jul 30, 1960, 53 y.o.   MRN: [id number]   James [name] 06/08/2013, 6:30 PM

## 2013-06-08 NOTE — Progress Notes (Signed)
Intern note/chart reviewed. Revisions made.  Braddock Servellon RD, LDN, CNSC [phone] Pager [phone] After Hours Pager  

## 2013-06-08 NOTE — Progress Notes (Signed)
Patient doing okay and is comfortable on the ventilator.  No acute distress.  May need to diurese, but will get CVP first.  Starting tube feedings.    This patient has been seen and I agree with the findings and treatment plan.  Kathryne Eriksson. Dahlia Bailiff, MD, [place] [phone] (pager) [phone] (direct pager) Trauma Surgeon

## 2013-06-08 NOTE — Progress Notes (Signed)
INITIAL NUTRITION ASSESSMENT  DOCUMENTATION CODES Per approved criteria  -Not Applicable   INTERVENTION: Initiate Pivot 1.5 Cal @ 20 ml/hr and increase 10 ml q 4 hours to goal rate of 50 ml/hr.   TF regimen with current propofol provides 1964 kcal (102% of estimated needs), 112 grams of protein (102% of estimated needs) and 911 ml free water.   NUTRITION DIAGNOSIS: Inadequate oral intake related to inability to eat as evidenced by NPO status.   Goal: Pt to meet >/=90% of estiamted nutrition needs   Monitor:  TF initiation/tolerance, respiratory status, weight trend, labs   Reason for Assessment: VDRF, consult for TF initiation/ management  53 y.o. male  Admitting Dx: Fall from ladder   ASSESSMENT: Pt is a 53 y.o male presenting with a fall off of a ladder. Pt found to have multiple rib fractures, left clavicle fracture and pneumothorax and small left hemothorax.   - RD consult to initiate and start TF  - Pt on regular diet from 6/2-6/3. Tolerated well, eating ~75% - Pt made NPO and intubated 6/4 for pt's respiratory decline  Nutrition Focused Physical Exam:  Subcutaneous Fat:  Orbital Region: wnl Upper Arm Region: wnl Thoracic and Lumbar Region: wnl  Muscle:  Temple Region: wnl Clavicle Bone Region: mild depletion Clavicle and Acromion Bone Region: mild depletion Scapular Bone Region: wnl Dorsal Hand: n/a  Patellar Region: mild depletion Anterior Thigh Region: mild depletion  Posterior Calf Region: wnl  Edema: none   Patient is currently intubated on ventilator support.  MV: 11.3 Temp (24hrs), Avg:99.9 F (37.7 C), Min:99.1 F (37.3 C), Max:101 F (38.3 C) Propofol: 6.2 ml/hr    Height: Ht Readings from Last 1 Encounters:  06/07/13 5\' 9"  (1.753 m)    Weight: Wt Readings from Last 1 Encounters:  06/07/13 154 lb 8.7 oz (70.1 kg)    Ideal Body Weight: 72.7 kg   % Ideal Body Weight: 96%  Wt Readings from Last 10 Encounters:  06/07/13 154 lb 8.7  oz (70.1 kg)    Usual Body Weight: unable to determine  % Usual Body Weight: -   BMI:  Body mass index is 22.81 kg/(m^2)., Normal   Estimated Nutritional Needs: Kcal: 1916 Protein: 100-110 grams  Fluid: >/= 1.9 L/day   Skin: WDL   Diet Order: NPO  EDUCATION NEEDS: -No education needs identified at this time   Intake/Output Summary (Last 24 hours) at 06/08/13 1025 Last data filed at 06/08/13 0900  Gross per 24 hour  Intake 2865.85 ml  Output   1295 ml  Net 1570.85 ml    Last BM: PTA   Labs:   Recent Labs Lab 06/04/13 1828 06/04/13 1914 06/08/13 0615  NA 142  --  138  K 4.2  --  5.0  CL 103  --  97  CO2 26  --  32  BUN 15  --  22  CREATININE 0.83 1.10 0.75  CALCIUM 9.5  --  9.1  GLUCOSE 139*  --  145*    CBG (last 3)  No results found for this basename: GLUCAP,  in the last 72 hours  Scheduled Meds: . antiseptic oral rinse  15 mL Mouth Rinse QID  . chlorhexidine  15 mL Mouth Rinse BID  . feeding supplement (PIVOT 1.5 CAL)  1,000 mL Per Tube Q24H  . folic acid  1 mg Intravenous Daily  . heparin subcutaneous  5,000 Units Subcutaneous 3 times per day  . ipratropium-albuterol  3 mL Nebulization Q4H  .  multivitamin with minerals  1 tablet Oral Daily  . pantoprazole (PROTONIX) IV  40 mg Intravenous QHS  . piperacillin-tazobactam (ZOSYN)  IV  3.375 g Intravenous Q8H  . pneumococcal 23 valent vaccine  0.5 mL Intramuscular Tomorrow-1000  . sodium chloride  3 mL Intravenous Q12H  . thiamine  100 mg Oral Daily   Or  . thiamine  100 mg Intravenous Daily  . vancomycin  750 mg Intravenous Q12H    Continuous Infusions: . dextrose 5 % and 0.45% NaCl 100 mL/hr at 06/08/13 0925  . fentaNYL infusion INTRAVENOUS 150 mcg/hr (06/08/13 0817)  . propofol 15 mcg/kg/min (06/08/13 0700)  . ropivacaine (PF) 2 mg/ml (0.2%) 8 mL/hr (06/07/13 1307)    Past Medical History  Diagnosis Date  . Pneumothorax     History reviewed. No pertinent past surgical  history.  Carrolyn Leigh, BS Dietetic Intern Pager: [phone]

## 2013-06-08 NOTE — Progress Notes (Signed)
    Subjective:     Patient reports pain as 3 on 0-10 scale.   Denies CP or SOB.  Voiding without difficulty. Positive flatus. Objective: Vital signs in last 24 hours: Temp:  [99.1 F (37.3 C)-101 F (38.3 C)] 99.3 F (37.4 C) (06/05 0833) Pulse Rate:  [94-109] 98 (06/05 1200) Resp:  [15-24] 20 (06/05 1200) BP: (85-125)/(51-80) 103/53 mmHg (06/05 1200) SpO2:  [90 %-100 %] 93 % (06/05 1200) FiO2 (%):  [40 %-100 %] 40 % (06/05 1121)  Intake/Output from previous day: 06/04 0701 - 06/05 0700 In: 2891.2 [I.V.:2345; IV Piggyback:450.2] Out: 1235 [Urine:1010; Emesis/NG output:225] Intake/Output this shift: Total I/O In: -  Out: 260 [Urine:260]  Labs:  Recent Labs  06/08/13 0615  HGB 11.9*    Recent Labs  06/08/13 0615  WBC 12.4*  RBC 3.50*  HCT 35.4*  PLT 172    Recent Labs  06/08/13 0615  NA 138  K 5.0  CL 97  CO2 32  BUN 22  CREATININE 0.75  GLUCOSE 145*  CALCIUM 9.1    Recent Labs  06/06/13 0955  INR 1.00    Physical Exam: Neurologically intact ABD soft Compartment soft  Assessment/Plan:     Patient continues to have respiratory issue - management per trauma Stable clavicle fracture ? Figure of 8 clavicle fx vs sling.  Will put figure of 8 strap - maybe easier to mange compared to sling Plan is to f/u with Dr Onnie Graham next week.  If still in house will have him see him.  Will sign off - please call with any questions  Melina Schools for Dr. Melina Schools [facility] [phone] 06/08/2013, 1:07 PM

## 2013-06-08 NOTE — Progress Notes (Signed)
OT Cancellation Note  Patient Details Name: SEANPAUL PREECE MRN: [id number] DOB: [birth year]   Cancelled Treatment:    Reason Eval/Treat Not Completed: Medical issues which prohibited therapy Pt intubated. Full vent support. [place], [place]  [phone] 06/08/2013 06/08/2013, 3:27 PM

## 2013-06-08 NOTE — Consult Note (Signed)
PULMONARY / CRITICAL CARE MEDICINE   Name: James [name] MRN: [id number] DOB: November 17, 1960    ADMISSION DATE:  06/04/2013 CONSULTATION DATE:  06/08/2013  REFERRING MD :  Dr. Hulen Skains PRIMARY SERVICE: Trauma  CHIEF COMPLAINT:  Respiratory failure  BRIEF PATIENT DESCRIPTION: 53 year old male who fell of a ladder 6/2 and suffered multiple rib fractures on the left and clavicular fracture.  On 6/4 the patient developed complete whiteout of his left lung and was subsequently intubated and bronched.  Successful re-expansion of the left lung.  PCCM asked to cross cover for the weekend.  Significant Events/Studies 6/4 respiratory failure and atelectasis of the left lung.  LINES / TUBES: ETT 6/4>>> [place] TLC 6/2>>>  CULTURES: BAL 6/4>>>  ANTIBIOTICS: Vanc 6/4>>> Zosyn 6/4>>>  PAST MEDICAL HISTORY :  Past Medical History  Diagnosis Date  . Pneumothorax    History reviewed. No pertinent past surgical history. Prior to Admission medications   Not on File   No Known Allergies  FAMILY HISTORY:  History reviewed. No pertinent family history. SOCIAL HISTORY:  reports that he has been smoking Cigarettes.  He has been smoking about 0.00 packs per day. He does not have any smokeless tobacco history on file. He reports that he drinks alcohol. He reports that he uses illicit drugs (Marijuana).  REVIEW OF SYSTEMS:  Unattainable, patient is sedated and intubated  SUBJECTIVE: No events overnight.  VITAL SIGNS: Temp:  [99.1 F (37.3 C)-101 F (38.3 C)] 99.3 F (37.4 C) (06/05 0833) Pulse Rate:  [94-109] 98 (06/05 1200) Resp:  [15-24] 20 (06/05 1200) BP: (85-125)/(51-80) 103/53 mmHg (06/05 1200) SpO2:  [90 %-100 %] 93 % (06/05 1200) FiO2 (%):  [40 %-100 %] 40 % (06/05 1121) HEMODYNAMICS: CVP:  [3 mmHg-6 mmHg] 3 mmHg VENTILATOR SETTINGS: Vent Mode:  [-] PRVC FiO2 (%):  [40 %-100 %] 40 % Set Rate:  [16 bmp-20 bmp] 20 bmp Vt Set:  [550 mL] 550 mL PEEP:  [5 cmH20] 5 cmH20 Plateau  Pressure:  [14 cmH20-16 cmH20] 14 cmH20 INTAKE / OUTPUT: Intake/Output     06/04 0701 - 06/05 0700 06/05 0701 - 06/06 0700   P.O.     I.V. (mL/kg) 2345 (33.5)    Other 96    IV Piggyback 450.2    Total Intake(mL/kg) 2891.2 (41.2)    Urine (mL/kg/hr) 1010 (0.6) 260 (0.7)   Emesis/NG output 225 (0.1)    Total Output 1235 260   Net +1656.2 -260          PHYSICAL EXAMINATION: General:  Chronically ill appearing male, sedated, NAD Neuro:  Withdraws all ext to pain, sedated, not arousable. HEENT:  [place]/AT, PERRL, EOM-spontaneous, MMM. Cardiovascular:  RRR, Nl S1/S2, -M/R/G. Lungs:  Coarse BS diffusely, crackles L>R. Abdomen:  Soft, NT, ND and +BS. Musculoskeletal:  -edema.  Clavicular fracture with arm in a sling. Skin:  Intact.  LABS:  CBC  Recent Labs Lab 06/04/13 1828 06/08/13 0615  WBC 13.9* 12.4*  HGB 14.5 11.9*  HCT 43.4 35.4*  PLT 212 172   Coag's  Recent Labs Lab 06/06/13 0955  INR 1.00   BMET  Recent Labs Lab 06/04/13 1828 06/04/13 1914 06/08/13 0615  NA 142  --  138  K 4.2  --  5.0  CL 103  --  97  CO2 26  --  32  BUN 15  --  22  CREATININE 0.83 1.10 0.75  GLUCOSE 139*  --  145*   Electrolytes  Recent Labs  Lab 06/04/13 1828 06/08/13 0615  CALCIUM 9.5 9.1   Sepsis Markers No results found for this basename: LATICACIDVEN, PROCALCITON, O2SATVEN,  in the last 168 hours ABG  Recent Labs Lab 06/07/13 1546 06/08/13 0026 06/08/13 0335  PHART 7.333* 7.356 7.398  PCO2ART 62.8* 61.3* 53.3*  PO2ART 62.6* 159.0* 90.9   Liver Enzymes No results found for this basename: AST, ALT, ALKPHOS, BILITOT, ALBUMIN,  in the last 168 hours Cardiac Enzymes No results found for this basename: TROPONINI, PROBNP,  in the last 168 hours Glucose No results found for this basename: GLUCAP,  in the last 168 hours  Imaging Dg Chest Port 1 View  06/08/2013   CLINICAL DATA:  Atelectasis, left rib fractures  EXAM: PORTABLE CHEST - 1 VIEW  COMPARISON:  06/07/2013;  06/06/2013; 06/04/2013; chest CT -06/04/2013  FINDINGS: Grossly unchanged cardiac silhouette and mediastinal contours. Stable positioning of support apparatus. Grossly unchanged small loculated left-sided pleural effusion with associated left mid and lower lung heterogeneous/consolidative opacities and mild left-sided volume loss. The right hemithorax is unchanged a remains well aerated. Multiple minimally displaced left-sided rib fractures are re- demonstrated though suboptimally evaluated. No subcutaneous emphysema. Old/healed right-sided rib fractures.  IMPRESSION: 1.  Stable positioning of support apparatus.  No pneumothorax. 2. Grossly unchanged small loculated left-sided effusion with associated left mid and lower lung opacities, atelectasis versus contusion. 3. Grossly unchanged appearance of multiple acute minimally displaced left-sided rib fractures. 4. Multiple old/healed right-sided rib fractures.   Electronically Signed   By: Sandi Mariscal M.D.   On: 06/08/2013 07:41   Dg Chest Port 1 View  06/07/2013   CLINICAL DATA:  Central line placement and NG tube placement. Shortness of breath.  EXAM: PORTABLE CHEST - 1 VIEW  COMPARISON:  06/07/2013 and prior radiographs  FINDINGS: An endotracheal tube with tip 3.6 cm above the carina again identified.  A right subclavian central venous catheter with tip overlying the mid SVC is present.  An NG tube is identified with tip overlying the proximal stomach.  The left pleural effusion is noted with improved left lung aeration, with continued left lung atelectasis/ airspace disease.  Multiple left-sided rib fractures are again noted.  No definite pneumothorax is noted.  IMPRESSION: Right subclavian central venous catheter with tip overlying the mid.  NG tube with tip overlying the proximal stomach.  Improved left lung aeration with continued atelectasis/airspace disease and left pleural effusion.   Electronically Signed   By: Hassan Rowan M.D.   On: 06/07/2013 13:03   Dg  Chest Port 1 View  06/07/2013   CLINICAL DATA:  Status post intubation and bronchoscopy  EXAM: PORTABLE CHEST - 1 VIEW  COMPARISON:  Portable chest x-ray of today's date at 5:48 a.m.  FINDINGS: The endotracheal tube tip lies 4.2 cm above the crotch of the carinal. Aeration of the left lung has improved but parenchymal consolidation laterally and inferiorly process. There is compensatory hyperinflation of the right lung with mild mediastinal shift. Heart borders are obscured. The pulmonary vascularity is not engorged on the right. Old deformity of the of multiple right ribs is demonstrated. Acute fractures of multiple left ribs are present.  IMPRESSION: There has been improvement in the appearance of the left hemithorax since the bronchoscopy and intubation of the trachea. No pneumothorax is evident. Density on the left laterally and inferiorly may reflect hemothorax given the multiple rib fractures present.   Electronically Signed   By: David  Martinique   On: 06/07/2013 10:33   Dg Chest  Port 1 View  06/07/2013   CLINICAL DATA:  Pneumonia  EXAM: PORTABLE CHEST - 1 VIEW  COMPARISON:  Yesterday  FINDINGS: There is now complete opacification of the left hemi thorax compatible with a combination of airspace disease, volume loss, and pleural fluid. Multiple left rib fractures are noted with displacement. No pneumothorax. Right lung is clear.  IMPRESSION: There is now complete opacification of the left hemi thorax as described.  No pneumothorax.   Electronically Signed   By: Maryclare Bean M.D.   On: 06/07/2013 07:51   Dg Chest Port 1 View  06/06/2013   CLINICAL DATA:  Evaluate pneumonia.  Fractured ribs.  EXAM: PORTABLE CHEST - 1 VIEW  COMPARISON:  06/06/2013 8:46 a.m.  FINDINGS: There is significant consolidation throughout most of the left lung, developing since the prior study. There is also volume loss with shift of mediastinal structures to the left.  Right lung is hyperexpanded but essentially clear. No pneumothorax is  seen. No right pleural effusion.  Left-sided rib fractures are again noted, without change.  IMPRESSION: New opacity throughout most of the left lung likely a combination of atelectasis and possibly contusion. There is also a left pleural effusion, and there is volume loss with mediastinal shift to the left. No pneumothorax. Right lung remains clear.   Electronically Signed   By: Lajean Manes M.D.   On: 06/06/2013 19:40     CXR: ETT ok, TLC ok, L>R atelectasis.  ASSESSMENT / PLAN:  PULMONARY A: VDRF likely due to stenting and rib fractures. P:   - Continue full vent support today. - May start PS trials in AM but no extubation until determination if the chest is flail. - ABG and CXR in AM. - Adjust vent to ABG.  CARDIOVASCULAR A: No current active issues. P:  - Monitor BP on propofol and fentanyl.  RENAL A:  Mild hyperkalemia. P:   - KVO IVF. - Start TF. - BMET in AM. - Correct electrolytes as indicated.  GASTROINTESTINAL A:  No active issues. P:   - Consult nutrition for TF.  HEMATOLOGIC A:  No active issues. P:  - Monitor CBC's daily. - Transfusion per ICU protocol.  INFECTIOUS A:  Concern for PNA on the left with the atelectasis. P:   - F/U on respiratory cultures. - Continue Vanc/Zosyn.  ENDOCRINE A:  Hyperglycemia with no history of DM.   P:   - Monitor for now, if persistently elevated then will consider ISS.  Would like to see glucose after initiation of TF.  NEUROLOGIC A:  No active issues. P:   RASS goal: 0 to -2. - If will remain intubated for >48 hours recommend changing from propofol to precedex or PRN versed to avoid side effects.  TODAY'S SUMMARY: Keep intubated with full vent support today.  Can likely start PS trials in AM but no extubation.  Recommend changing sedation in AM as above.  CVP is 3, no indication for diureses at this time.  Continue treatment with broad spectrum abx.  I have personally obtained a history, examined the patient,  evaluated laboratory and imaging results, formulated the assessment and plan and placed orders.  CRITICAL CARE: The patient is critically ill with multiple organ systems failure and requires high complexity decision making for assessment and support, frequent evaluation and titration of therapies, application of advanced monitoring technologies and extensive interpretation of multiple databases. Critical Care Time devoted to patient care services described in this note is 35 minutes.   Wesam  Kathryne Sharper, M.D. [facility] Pulmonary/Critical Care Medicine. Pager: [phone]. After hours pager: [phone].

## 2013-06-08 NOTE — Progress Notes (Signed)
Patient ID: MASAKI ROTHBAUER, male   DOB: 01-Oct-1960, 53 y.o.   MRN: [id number]  LOS: 4 days   Subjective: Fevers, WBC 12.  BAL pending, reps culture GPC, on empiric atbx.  K 5 today.   Objective: Vital signs in last 24 hours: Temp:  [99.1 F (37.3 C)-101 F (38.3 C)] 99.1 F (37.3 C) (06/05 0309) Pulse Rate:  [87-109] 94 (06/05 0725) Resp:  [15-24] 15 (06/05 0725) BP: (85-138)/(51-81) 101/59 mmHg (06/05 0725) SpO2:  [90 %-100 %] 97 % (06/05 0725) FiO2 (%):  [40 %-100 %] 40 % (06/05 0725) Last BM Date:  (PTA)  Lab Results:  CBC  Recent Labs  06/08/13 0615  WBC 12.4*  HGB 11.9*  HCT 35.4*  PLT 172   BMET  Recent Labs  06/08/13 0615  NA 138  K 5.0  CL 97  CO2 32  GLUCOSE 145*  BUN 22  CREATININE 0.75  CALCIUM 9.1    Imaging: Dg Chest 2 View  06/06/2013   CLINICAL DATA:  eval PTX  EXAM: CHEST - 2 VIEW  COMPARISON:  the previous day's study  FINDINGS: There is a persistent left pleural effusion. Multiple displaced left rib fractures and a displaced left clavicle fracture as before. No definite pneumothorax component. Consolidation/ atelectasis in the left lower lobe. There is an enlarging left mid lung opacity having sharply defined medial and inferior margins, less well-defined superior and lateral margins, which may represent some fluid in the fissure versus focal pulmonary consolidation possibly contusion given the history of recent trauma.  Right lung remains clear. Multiple old right rib fractures. Heart size remains normal. .  IMPRESSION: 1. Persistent left pleural effusion and left lower lung consolidation/atelectasis. 2. More conspicuous left mid lung opacity may represent progressive focal contusion/consolidation or pseudotumor.   Electronically Signed   By: Arne Cleveland M.D.   On: 06/06/2013 08:56   Dg Chest Port 1 View  06/08/2013   CLINICAL DATA:  Atelectasis, left rib fractures  EXAM: PORTABLE CHEST - 1 VIEW  COMPARISON:  06/07/2013; 06/06/2013; 06/04/2013;  chest CT -06/04/2013  FINDINGS: Grossly unchanged cardiac silhouette and mediastinal contours. Stable positioning of support apparatus. Grossly unchanged small loculated left-sided pleural effusion with associated left mid and lower lung heterogeneous/consolidative opacities and mild left-sided volume loss. The right hemithorax is unchanged a remains well aerated. Multiple minimally displaced left-sided rib fractures are re- demonstrated though suboptimally evaluated. No subcutaneous emphysema. Old/healed right-sided rib fractures.  IMPRESSION: 1.  Stable positioning of support apparatus.  No pneumothorax. 2. Grossly unchanged small loculated left-sided effusion with associated left mid and lower lung opacities, atelectasis versus contusion. 3. Grossly unchanged appearance of multiple acute minimally displaced left-sided rib fractures. 4. Multiple old/healed right-sided rib fractures.   Electronically Signed   By: Sandi Mariscal M.D.   On: 06/08/2013 07:41   Dg Chest Port 1 View  06/07/2013   CLINICAL DATA:  Central line placement and NG tube placement. Shortness of breath.  EXAM: PORTABLE CHEST - 1 VIEW  COMPARISON:  06/07/2013 and prior radiographs  FINDINGS: An endotracheal tube with tip 3.6 cm above the carina again identified.  A right subclavian central venous catheter with tip overlying the mid SVC is present.  An NG tube is identified with tip overlying the proximal stomach.  The left pleural effusion is noted with improved left lung aeration, with continued left lung atelectasis/ airspace disease.  Multiple left-sided rib fractures are again noted.  No definite pneumothorax is noted.  IMPRESSION: Right  subclavian central venous catheter with tip overlying the mid.  NG tube with tip overlying the proximal stomach.  Improved left lung aeration with continued atelectasis/airspace disease and left pleural effusion.   Electronically Signed   By: Hassan Rowan M.D.   On: 06/07/2013 13:03   Dg Chest Port 1  View  06/07/2013   CLINICAL DATA:  Status post intubation and bronchoscopy  EXAM: PORTABLE CHEST - 1 VIEW  COMPARISON:  Portable chest x-ray of today's date at 5:48 a.m.  FINDINGS: The endotracheal tube tip lies 4.2 cm above the crotch of the carinal. Aeration of the left lung has improved but parenchymal consolidation laterally and inferiorly process. There is compensatory hyperinflation of the right lung with mild mediastinal shift. Heart borders are obscured. The pulmonary vascularity is not engorged on the right. Old deformity of the of multiple right ribs is demonstrated. Acute fractures of multiple left ribs are present.  IMPRESSION: There has been improvement in the appearance of the left hemithorax since the bronchoscopy and intubation of the trachea. No pneumothorax is evident. Density on the left laterally and inferiorly may reflect hemothorax given the multiple rib fractures present.   Electronically Signed   By: David  [place]   On: 06/07/2013 10:33   Dg Chest Port 1 View  06/07/2013   CLINICAL DATA:  Pneumonia  EXAM: PORTABLE CHEST - 1 VIEW  COMPARISON:  Yesterday  FINDINGS: There is now complete opacification of the left hemi thorax compatible with a combination of airspace disease, volume loss, and pleural fluid. Multiple left rib fractures are noted with displacement. No pneumothorax. Right lung is clear.  IMPRESSION: There is now complete opacification of the left hemi thorax as described.  No pneumothorax.   Electronically Signed   By: Maryclare Bean M.D.   On: 06/07/2013 07:51   Dg Chest Port 1 View  06/06/2013   CLINICAL DATA:  Evaluate pneumonia.  Fractured ribs.  EXAM: PORTABLE CHEST - 1 VIEW  COMPARISON:  06/06/2013 8:46 a.m.  FINDINGS: There is significant consolidation throughout most of the left lung, developing since the prior study. There is also volume loss with shift of mediastinal structures to the left.  Right lung is hyperexpanded but essentially clear. No pneumothorax is seen. No  right pleural effusion.  Left-sided rib fractures are again noted, without change.  IMPRESSION: New opacity throughout most of the left lung likely a combination of atelectasis and possibly contusion. There is also a left pleural effusion, and there is volume loss with mediastinal shift to the left. No pneumothorax. Right lung remains clear.   Electronically Signed   By: Lajean Manes M.D.   On: 06/06/2013 19:40    PE:  General appearance: awake on vent Resp: CTA Cardio: regular rate and rhythm, S1, S2 normal, no murmur, click, rub or gallop  GI: soft, non-tender; bowel sounds normal; no masses, no organomegaly  Extremities: extremities normal, atraumatic, no cyanosis or edema    Patient Active Problem List   Diagnosis Date Noted  . Fall from ladder 06/05/2013   Assessment/Plan: Fall from ladder  Acute respiratory failure/Multiple left sided rib fracture/HPTX - bronchoscopy, intubated 6/4, suction PRN, nebs Left comminuted clavicle fracture-Dr. Rolena Infante following. Sling for now, outpatient follow up with Dr. Onnie Graham  VTE - SCD's, heparin ID - Vanc Zosyn empiric, send BAL during bronch  FEN - NPO for intubation, OGT, IVF remove KCL from fluids  Hx opioid dependence  Dispo -- continue ICU   Erby Pian, ANP-BC Pager: [phone] General  Trauma PA Pager: [phone]   06/08/2013 8:34 AM

## 2013-06-08 NOTE — Progress Notes (Signed)
UR completed.  Caedmon Louque, RN BSN [facility] CCM Trauma/Neuro ICU Case Manager [phone]  

## 2013-06-09 ENCOUNTER — Inpatient Hospital Stay (HOSPITAL_COMMUNITY): Payer: Self-pay

## 2013-06-09 LAB — CBC
HEMATOCRIT: 32.2 % — AB (ref 39.0–52.0)
HEMOGLOBIN: 10.7 g/dL — AB (ref 13.0–17.0)
MCH: 34.1 pg — ABNORMAL HIGH (ref 26.0–34.0)
MCHC: 33.2 g/dL (ref 30.0–36.0)
MCV: 102.5 fL — AB (ref 78.0–100.0)
PLATELETS: 164 10*3/uL (ref 150–400)
RBC: 3.14 MIL/uL — ABNORMAL LOW (ref 4.22–5.81)
RDW: 12.6 % (ref 11.5–15.5)
WBC: 9.6 10*3/uL (ref 4.0–10.5)

## 2013-06-09 LAB — BLOOD GAS, ARTERIAL
ACID-BASE EXCESS: 7.3 mmol/L — AB (ref 0.0–2.0)
Bicarbonate: 32.3 mEq/L — ABNORMAL HIGH (ref 20.0–24.0)
Drawn by: 28701
FIO2: 0.45 %
LHR: 20 {breaths}/min
O2 SAT: 92.7 %
PEEP/CPAP: 5 cmH2O
Patient temperature: 99.3
TCO2: 34 mmol/L (ref 0–100)
VT: 550 mL
pCO2 arterial: 55.8 mmHg — ABNORMAL HIGH (ref 35.0–45.0)
pH, Arterial: 7.383 (ref 7.350–7.450)
pO2, Arterial: 69.6 mmHg — ABNORMAL LOW (ref 80.0–100.0)

## 2013-06-09 LAB — BASIC METABOLIC PANEL
BUN: 28 mg/dL — ABNORMAL HIGH (ref 6–23)
CALCIUM: 8.6 mg/dL (ref 8.4–10.5)
CHLORIDE: 98 meq/L (ref 96–112)
CO2: 32 mEq/L (ref 19–32)
Creatinine, Ser: 0.73 mg/dL (ref 0.50–1.35)
GFR calc Af Amer: >90 mL/min (ref >90)
GFR calc non Af Amer: >90 mL/min (ref >90)
GLUCOSE: 144 mg/dL — AB (ref 70–99)
Potassium: 4.4 mEq/L (ref 3.7–5.3)
SODIUM: 137 meq/L (ref 137–147)

## 2013-06-09 LAB — GLUCOSE, CAPILLARY
GLUCOSE-CAPILLARY: 97 mg/dL (ref 70–99)
Glucose-Capillary: 103 mg/dL — ABNORMAL HIGH (ref 70–99)
Glucose-Capillary: 119 mg/dL — ABNORMAL HIGH (ref 70–99)
Glucose-Capillary: 123 mg/dL — ABNORMAL HIGH (ref 70–99)
Glucose-Capillary: 131 mg/dL — ABNORMAL HIGH (ref 70–99)
Glucose-Capillary: 153 mg/dL — ABNORMAL HIGH (ref 70–99)

## 2013-06-09 LAB — CULTURE, BAL-QUANTITATIVE

## 2013-06-09 LAB — CULTURE, BAL-QUANTITATIVE W GRAM STAIN

## 2013-06-09 LAB — MAGNESIUM: Magnesium: 2.5 mg/dL (ref 1.5–2.5)

## 2013-06-09 LAB — VANCOMYCIN, TROUGH: Vancomycin Tr: <5 ug/mL — ABNORMAL LOW (ref 10.0–20.0)

## 2013-06-09 LAB — PHOSPHORUS: Phosphorus: 3.8 mg/dL (ref 2.3–4.6)

## 2013-06-09 MED ORDER — VANCOMYCIN HCL IN DEXTROSE 750-5 MG/150ML-% IV SOLN
750.0000 mg | Freq: Three times a day (TID) | INTRAVENOUS | Status: DC
  Administered 2013-06-09 – 2013-06-10 (×4): 750 mg via INTRAVENOUS
  Filled 2013-06-09 (×5): qty 150

## 2013-06-09 NOTE — Progress Notes (Signed)
ANTIBIOTIC CONSULT NOTE - FOLLOW UP  Pharmacy Consult for vancomycin Indication: pneumonia  No Known Allergies  Patient Measurements: Height: 5\' 9"  (175.3 cm) Weight: 156 lb 15.5 oz (71.2 kg) IBW/kg (Calculated) : 70.7   Vital Signs: Temp: 100 F (37.8 C) (06/06 0744) Temp src: Axillary (06/06 0744) BP: 108/62 mmHg (06/06 1200) Pulse Rate: 97 (06/06 1200) Intake/Output from previous day: 06/05 0701 - 06/06 0700 In: 1552.8 [I.V.:650.3; NG/GT:540; IV Piggyback:362.5] Out: 920 [Urine:920] Intake/Output from this shift: Total I/O In: 697.5 [I.V.:380; NG/GT:280; IV Piggyback:37.5] Out: 275 [Urine:275]  Labs:  Recent Labs  06/08/13 0615 06/09/13 0440  WBC 12.4* 9.6  HGB 11.9* 10.7*  PLT 172 164  CREATININE 0.75 0.73   Estimated Creatinine Clearance: 108 ml/min (by C-G formula based on Cr of 0.73).  Recent Labs  06/09/13 1005  VANCOTROUGH <5.0*     Assessment: 73 YOM on Vanc/Zosyn D#4 for HCAP, CXR unchanged, small loculated effusion L lung + mid-lower opacification. atx vs contusion. bronch 6/4. Tmax 100.9, WBC 12.4 > 9.6. Vancomycin trough was < 5, per CCM note, might consider d/c vancomycin soon. Renal function has been stable.  Vanc 6/3 >> Zosyn 6/3 >>  6/4 BAL - non-pathogenic flora 6/3 TA - non-pathogenic flora 6/2 MRSA screen eng  Goal of Therapy:  Vancomycin trough level 15-20 mcg/ml  Plan:  - Increase vancomycin to 750 mg IV Q 8 hrs - Zosyn 3.375gm IV Q8H, 4 hr infusion - f/u LOT, and renal function.  Maryanna Shape, PharmD, BCPS  Clinical Pharmacist  Pager: [phone]   06/09/2013,1:08 PM

## 2013-06-09 NOTE — Progress Notes (Signed)
PULMONARY / CRITICAL CARE MEDICINE   Name: James [name] MRN: [id number] DOB: 03-Aug-1960    ADMISSION DATE:  06/04/2013 CONSULTATION DATE:  06/08/2013  REFERRING MD :  Dr. Hulen Skains PRIMARY SERVICE: Trauma  CHIEF COMPLAINT:  Respiratory failure  BRIEF PATIENT DESCRIPTION: 53 year old male who fell of a ladder 6/2 and suffered multiple rib fractures on the left and clavicular fracture.  On 6/4 the patient developed complete whiteout of his left lung and was subsequently intubated and bronched.  Successful re-expansion of the left lung.  PCCM asked to cross cover for the weekend.  Significant Events/Studies 6/4 respiratory failure and atelectasis of the left lung. Fx ribs.  LINES / TUBES: ETT 6/4>>> R [place] TLC 6/2>>> 6/2 epidural>>  CULTURES: BAL 6/4>>>nl flora  ANTIBIOTICS: Vanc 6/4>>> Zosyn 6/4>>>   SUBJECTIVE:  Comfortable on vent  VITAL SIGNS: Temp:  [98.8 F (37.1 C)-100.9 F (38.3 C)] 100 F (37.8 C) (06/06 0744) Pulse Rate:  [89-108] 89 (06/06 1000) Resp:  [19-21] 20 (06/06 1000) BP: (90-140)/(50-78) 93/60 mmHg (06/06 1000) SpO2:  [89 %-99 %] 95 % (06/06 1000) FiO2 (%):  [40 %-50 %] 50 % (06/06 1000) Weight:  [70.7 kg (155 lb 13.8 oz)-71.2 kg (156 lb 15.5 oz)] 71.2 kg (156 lb 15.5 oz) (06/06 0425) HEMODYNAMICS: CVP:  [3 mmHg-6 mmHg] 5 mmHg VENTILATOR SETTINGS: Vent Mode:  [-] PRVC FiO2 (%):  [40 %-50 %] 50 % Set Rate:  [20 bmp] 20 bmp Vt Set:  [550 mL] 550 mL PEEP:  [5 cmH20] 5 cmH20 Plateau Pressure:  [14 cmH20-16 cmH20] 15 cmH20 INTAKE / OUTPUT: Intake/Output     06/05 0701 - 06/06 0700 06/06 0701 - 06/07 0700   I.V. (mL/kg) 650.3 (9.1) 164 (2.3)   Other     NG/GT 540 80   IV Piggyback 362.5 25   Total Intake(mL/kg) 1552.8 (21.8) 269 (3.8)   Urine (mL/kg/hr) 920 (0.5) 275 (0.8)   Emesis/NG output     Total Output 920 275   Net +632.8 -6          PHYSICAL EXAMINATION: General:  Chronically ill appearing male, sedated, NAD Neuro:  Withdraws all ext  to pain, sedated,follows commands HEENT:  [place]/AT, PERRL, EOM-spontaneous, MMM. Cardiovascular:  RRR, Nl S1/S2, -M/R/G. Lungs:  Coarse BS diffusely, crackles L>R. Abdomen:  Soft, NT, ND and +BS. Musculoskeletal:  -edema.  Clavicular fracture with arm in a sling. Skin:  Intact.  LABS:  CBC  Recent Labs Lab 06/04/13 1828 06/08/13 0615 06/09/13 0440  WBC 13.9* 12.4* 9.6  HGB 14.5 11.9* 10.7*  HCT 43.4 35.4* 32.2*  PLT 212 172 164   Coag's  Recent Labs Lab 06/06/13 0955  INR 1.00   BMET  Recent Labs Lab 06/04/13 1828 06/04/13 1914 06/08/13 0615 06/09/13 0440  NA 142  --  138 137  K 4.2  --  5.0 4.4  CL 103  --  97 98  CO2 26  --  32 32  BUN 15  --  22 28*  CREATININE 0.83 1.10 0.75 0.73  GLUCOSE 139*  --  145* 144*   Electrolytes  Recent Labs Lab 06/04/13 1828 06/08/13 0615 06/09/13 0440  CALCIUM 9.5 9.1 8.6  MG  --   --  2.5  PHOS  --   --  3.8   Sepsis Markers No results found for this basename: LATICACIDVEN, PROCALCITON, O2SATVEN,  in the last 168 hours ABG  Recent Labs Lab 06/08/13 0026 06/08/13 0335 06/09/13 0430  PHART  7.356 7.398 7.383  PCO2ART 61.3* 53.3* 55.8*  PO2ART 159.0* 90.9 69.6*   Liver Enzymes No results found for this basename: AST, ALT, ALKPHOS, BILITOT, ALBUMIN,  in the last 168 hours Cardiac Enzymes No results found for this basename: TROPONINI, PROBNP,  in the last 168 hours Glucose  Recent Labs Lab 06/08/13 1553 06/08/13 2012 06/08/13 2322 06/09/13 0311 06/09/13 0743  GLUCAP 116* 147* 132* 119* 153*    Imaging Dg Chest Port 1 View  06/09/2013   CLINICAL DATA:  Endotracheal tube  EXAM: PORTABLE CHEST - 1 VIEW  COMPARISON:  Prior radiograph from 06/08/2013  FINDINGS: The tip of the endotracheal tube is positioned approximately 4.2 cm above the carina. Enteric tube courses into the abdomen. Right subclavian central venous catheter is unchanged. Cardiac and mediastinal silhouettes are stable.  A moderate left pleural  effusion is again CN, which appears loculated laterally. Associated left mid and lower lung opacities and volume loss are not significantly changed. The right lung remains clear. No pneumothorax or pulmonary edema.  Multiple left-sided rib fractures again noted. Remote right-sided rib fractures again noted.  IMPRESSION: 1. Stable support apparatus with tip of the endotracheal tube approximately 4.2 cm above the carina. 2. Grossly unchanged loculated left pleural effusion with associated left mid and lower lung opacities, which may reflect atelectasis versus contusion. 3. Unchanged minimally displaced left-sided rib fractures. 4. Unchanged remote/healed right-sided rib fractures.   Electronically Signed   By: Jeannine Boga M.D.   On: 06/09/2013 07:00   Dg Chest Port 1 View  06/08/2013   CLINICAL DATA:  Atelectasis, left rib fractures  EXAM: PORTABLE CHEST - 1 VIEW  COMPARISON:  06/07/2013; 06/06/2013; 06/04/2013; chest CT -06/04/2013  FINDINGS: Grossly unchanged cardiac silhouette and mediastinal contours. Stable positioning of support apparatus. Grossly unchanged small loculated left-sided pleural effusion with associated left mid and lower lung heterogeneous/consolidative opacities and mild left-sided volume loss. The right hemithorax is unchanged a remains well aerated. Multiple minimally displaced left-sided rib fractures are re- demonstrated though suboptimally evaluated. No subcutaneous emphysema. Old/healed right-sided rib fractures.  IMPRESSION: 1.  Stable positioning of support apparatus.  No pneumothorax. 2. Grossly unchanged small loculated left-sided effusion with associated left mid and lower lung opacities, atelectasis versus contusion. 3. Grossly unchanged appearance of multiple acute minimally displaced left-sided rib fractures. 4. Multiple old/healed right-sided rib fractures.   Electronically Signed   By: Sandi Mariscal M.D.   On: 06/08/2013 07:41   Dg Chest Port 1 View  06/07/2013    CLINICAL DATA:  Central line placement and NG tube placement. Shortness of breath.  EXAM: PORTABLE CHEST - 1 VIEW  COMPARISON:  06/07/2013 and prior radiographs  FINDINGS: An endotracheal tube with tip 3.6 cm above the carina again identified.  A right subclavian central venous catheter with tip overlying the mid SVC is present.  An NG tube is identified with tip overlying the proximal stomach.  The left pleural effusion is noted with improved left lung aeration, with continued left lung atelectasis/ airspace disease.  Multiple left-sided rib fractures are again noted.  No definite pneumothorax is noted.  IMPRESSION: Right subclavian central venous catheter with tip overlying the mid.  NG tube with tip overlying the proximal stomach.  Improved left lung aeration with continued atelectasis/airspace disease and left pleural effusion.   Electronically Signed   By: Hassan Rowan M.D.   On: 06/07/2013 13:03       ASSESSMENT / PLAN:  PULMONARY A: VDRF likely due to stenting and  rib fractures.   - by PM down to 40% fio2. Appers to  Have left pleural effusion P:   - Chest CT to see if this is hemothorax - left pleural effusion (staff MD note) - Continue full vent support today. - May start PS trials when he meets criteria but no extubation until determination if the chest is flail. - ABG and CXR in AM. - Adjust vent to ABG.  CARDIOVASCULAR A: No current active issues. P:  - Monitor BP on propofol and fentanyl.  RENAL A:  Nil acute P:   - KVO IVF. - Start TF. Per trauma  - BMET i - Correct electrolytes as indicated.  GASTROINTESTINAL A:  No active issues. P:   - tf  HEMATOLOGIC A:  Mild anemia of critica illness P:  - Monitor CBC's daily. - Transfusion per ICU protocol.; for hgb  7gm%  INFECTIOUS A:  Concern for PNA on the left with the atelectasis. P:   - F/U on respiratory cultures. Nl flora - Continue Vanc/Zosyn.  - 6/7 consider dc vanc.  ENDOCRINE A:  Hyperglycemia with no  history of DM.   P:   - Monitor for now, if persistently elevated then will consider ISS.  Would like to see glucose after initiation of TF.  NEUROLOGIC A:  No active issues. P:   RASS goal: 0 to -2. - If will remain intubated for >48 hours recommend changing from propofol to precedex or PRN versed to avoid side effects. (check lactate and CK to monitor for propofol toxicity)  TODAY'S SUMMARY:  Intubated and with multiple rib fxs. Fio2 50 % currently.   Richardson Landry Minor ACNP Maryanna Shape PCCM Pager [phone] till 3 pm If no answer page [phone] 06/09/2013, 11:49 AM   STAFF NOTE: I, Dr Ann Lions have personally reviewed patient's available data, including medical history, events of note, physical examination and test results as part of my evaluation. I have discussed with resident/NP and other care providers such as pharmacist, RN and RRT.  In addition,  I personally evaluated patient and elicited key findings of acute post trauma rib fracture respiratory failure. He has left sided effusion - will get CT chest to better definte this. Might need thora v chest tube.  Rest per NP/medical resident whose note is outlined above and that I agree with  The patient is critically ill with multiple organ systems failure and requires high complexity decision making for assessment and support, frequent evaluation and titration of therapies, application of advanced monitoring technologies and extensive interpretation of multiple databases.   Critical Care Time devoted to patient care services described in this note is  30  Minutes.  Dr. Brand Males, M.D., [facility].C.P Pulmonary and Critical Care Medicine Staff Physician [place] Pager: [phone], If no answer or between  15:00h - 7:00h: call [phone]  06/09/2013 4:38 PM

## 2013-06-09 NOTE — Progress Notes (Signed)
Pt back from ct transport.  Placed back on same vent setting

## 2013-06-09 NOTE — Miscellaneous (Signed)
  Anesthesia Pain Follow-up Note  [name]  Day #: 3  Date of Follow-up: 06/09/2013 Time: 9:52 AM  Last Vitals:  Filed Vitals:   06/09/13 0744  BP:   Pulse:   Temp: 37.8 C  Resp:     Level of Consciousness: Sedated on ventilator  Pain: appears comfortable   Side Effects:None apparent  Catheter Site Exam:clear  Plan: Continue current therapy while intubated.  Discussed with Dr. Leeanne Deed

## 2013-06-09 NOTE — Progress Notes (Signed)
Patient ID: James [name], male   DOB: [birth year], 53 y.o.   MRN: [id number]  LOS: 5 days   Subjective: On vent.  Low grade temp, mild tachycardia, white count is down.    Objective: Vital signs in last 24 hours: Temp:  [98.8 F (37.1 C)-100.9 F (38.3 C)] 100 F (37.8 C) (06/06 0744) Pulse Rate:  [92-108] 92 (06/06 0700) Resp:  [19-21] 20 (06/06 0700) BP: (90-140)/(51-78) 105/60 mmHg (06/06 0700) SpO2:  [89 %-99 %] 99 % (06/06 0700) FiO2 (%):  [40 %-50 %] 50 % (06/06 0517) Weight:  [155 lb 13.8 oz (70.7 kg)-156 lb 15.5 oz (71.2 kg)] 156 lb 15.5 oz (71.2 kg) (06/06 0425) Last BM Date:  (PTA)  Lab Results:  CBC  Recent Labs  06/08/13 0615 06/09/13 0440  WBC 12.4* 9.6  HGB 11.9* 10.7*  HCT 35.4* 32.2*  PLT 172 164   BMET  Recent Labs  06/08/13 0615 06/09/13 0440  NA 138 137  K 5.0 4.4  CL 97 98  CO2 32 32  GLUCOSE 145* 144*  BUN 22 28*  CREATININE 0.75 0.73  CALCIUM 9.1 8.6    Imaging: Dg Chest Port 1 View  06/09/2013   CLINICAL DATA:  Endotracheal tube  EXAM: PORTABLE CHEST - 1 VIEW  COMPARISON:  Prior radiograph from 06/08/2013  FINDINGS: The tip of the endotracheal tube is positioned approximately 4.2 cm above the carina. Enteric tube courses into the abdomen. Right subclavian central venous catheter is unchanged. Cardiac and mediastinal silhouettes are stable.  A moderate left pleural effusion is again CN, which appears loculated laterally. Associated left mid and lower lung opacities and volume loss are not significantly changed. The right lung remains clear. No pneumothorax or pulmonary edema.  Multiple left-sided rib fractures again noted. Remote right-sided rib fractures again noted.  IMPRESSION: 1. Stable support apparatus with tip of the endotracheal tube approximately 4.2 cm above the carina. 2. Grossly unchanged loculated left pleural effusion with associated left mid and lower lung opacities, which may reflect atelectasis versus contusion. 3. Unchanged  minimally displaced left-sided rib fractures. 4. Unchanged remote/healed right-sided rib fractures.   Electronically Signed   By: Jeannine Boga M.D.   On: 06/09/2013 07:00   Dg Chest Port 1 View  06/08/2013   CLINICAL DATA:  Atelectasis, left rib fractures  EXAM: PORTABLE CHEST - 1 VIEW  COMPARISON:  06/07/2013; 06/06/2013; 06/04/2013; chest CT -06/04/2013  FINDINGS: Grossly unchanged cardiac silhouette and mediastinal contours. Stable positioning of support apparatus. Grossly unchanged small loculated left-sided pleural effusion with associated left mid and lower lung heterogeneous/consolidative opacities and mild left-sided volume loss. The right hemithorax is unchanged a remains well aerated. Multiple minimally displaced left-sided rib fractures are re- demonstrated though suboptimally evaluated. No subcutaneous emphysema. Old/healed right-sided rib fractures.  IMPRESSION: 1.  Stable positioning of support apparatus.  No pneumothorax. 2. Grossly unchanged small loculated left-sided effusion with associated left mid and lower lung opacities, atelectasis versus contusion. 3. Grossly unchanged appearance of multiple acute minimally displaced left-sided rib fractures. 4. Multiple old/healed right-sided rib fractures.   Electronically Signed   By: Sandi Mariscal M.D.   On: 06/08/2013 07:41   Dg Chest Port 1 View  06/07/2013   CLINICAL DATA:  Central line placement and NG tube placement. Shortness of breath.  EXAM: PORTABLE CHEST - 1 VIEW  COMPARISON:  06/07/2013 and prior radiographs  FINDINGS: An endotracheal tube with tip 3.6 cm above the carina again identified.  A right subclavian central  venous catheter with tip overlying the mid SVC is present.  An NG tube is identified with tip overlying the proximal stomach.  The left pleural effusion is noted with improved left lung aeration, with continued left lung atelectasis/ airspace disease.  Multiple left-sided rib fractures are again noted.  No definite  pneumothorax is noted.  IMPRESSION: Right subclavian central venous catheter with tip overlying the mid.  NG tube with tip overlying the proximal stomach.  Improved left lung aeration with continued atelectasis/airspace disease and left pleural effusion.   Electronically Signed   By: Hassan Rowan M.D.   On: 06/07/2013 13:03   Dg Chest Port 1 View  06/07/2013   CLINICAL DATA:  Status post intubation and bronchoscopy  EXAM: PORTABLE CHEST - 1 VIEW  COMPARISON:  Portable chest x-ray of today's date at 5:48 a.m.  FINDINGS: The endotracheal tube tip lies 4.2 cm above the crotch of the carinal. Aeration of the left lung has improved but parenchymal consolidation laterally and inferiorly process. There is compensatory hyperinflation of the right lung with mild mediastinal shift. Heart borders are obscured. The pulmonary vascularity is not engorged on the right. Old deformity of the of multiple right ribs is demonstrated. Acute fractures of multiple left ribs are present.  IMPRESSION: There has been improvement in the appearance of the left hemithorax since the bronchoscopy and intubation of the trachea. No pneumothorax is evident. Density on the left laterally and inferiorly may reflect hemothorax given the multiple rib fractures present.   Electronically Signed   By: David  Martin[place]   On: 06/07/2013 10:33    PE:  General appearance: sedated on vent  Resp: CTA  Cardio: regular rate and rhythm, S1, S2 normal, no murmur, click, rub or gallop  GI: soft, non-tender; bowel sounds normal; no masses, no organomegaly  Extremities: extremities normal, atraumatic, no cyanosis or edema     Patient Active Problem List   Diagnosis Date Noted  . Acute respiratory failure 06/08/2013  . Altered mental status 06/08/2013  . Pneumonia, organism unspecified 06/08/2013  . Fall from ladder 06/05/2013    Assessment/Plan:  Fall from ladder  Acute respiratory failure/Multiple left sided rib fracture/HPTX - bronchoscopy, intubated  6/4, suction PRN, nebs  -appreciate CCM assistance Left comminuted clavicle fracture-Dr. Rolena Infante following. Figure 8 strap, outpatient follow up with Dr. Onnie Graham  VTE - SCD's, heparin  ID - Vanc Zosyn empiric,  BAL negative to date FEN - TF per OGT Hx opioid dependence  Dispo -- continue ICU   Erby Pian, ANP-BC Pager: [phone] General Trauma PA Pager: [phone]   06/09/2013 8:01 AM

## 2013-06-09 NOTE — Progress Notes (Signed)
I have seen and examined the patient and agree with the assessment and plans. Continuing current care Anesthesia will leave epidural in place for now  Kinisha Soper A. Ninfa Linden  MD, FACS

## 2013-06-10 ENCOUNTER — Inpatient Hospital Stay (HOSPITAL_COMMUNITY): Payer: Self-pay

## 2013-06-10 DIAGNOSIS — S271XXA Traumatic hemothorax, initial encounter: Secondary | ICD-10-CM

## 2013-06-10 DIAGNOSIS — J90 Pleural effusion, not elsewhere classified: Secondary | ICD-10-CM

## 2013-06-10 DIAGNOSIS — F1010 Alcohol abuse, uncomplicated: Secondary | ICD-10-CM

## 2013-06-10 LAB — URINALYSIS, ROUTINE W REFLEX MICROSCOPIC
Bilirubin Urine: NEGATIVE
Glucose, UA: NEGATIVE mg/dL
Hgb urine dipstick: NEGATIVE
Ketones, ur: NEGATIVE mg/dL
Leukocytes, UA: NEGATIVE
Nitrite: NEGATIVE
Protein, ur: NEGATIVE mg/dL
Specific Gravity, Urine: 1.031 — ABNORMAL HIGH (ref 1.005–1.030)
Urobilinogen, UA: 4 mg/dL — ABNORMAL HIGH (ref 0.0–1.0)
pH: 7.5 (ref 5.0–8.0)

## 2013-06-10 LAB — GLUCOSE, CAPILLARY
GLUCOSE-CAPILLARY: 126 mg/dL — AB (ref 70–99)
GLUCOSE-CAPILLARY: 175 mg/dL — AB (ref 70–99)
GLUCOSE-CAPILLARY: 175 mg/dL — AB (ref 70–99)
Glucose-Capillary: 117 mg/dL — ABNORMAL HIGH (ref 70–99)
Glucose-Capillary: 121 mg/dL — ABNORMAL HIGH (ref 70–99)
Glucose-Capillary: 124 mg/dL — ABNORMAL HIGH (ref 70–99)
Glucose-Capillary: 132 mg/dL — ABNORMAL HIGH (ref 70–99)

## 2013-06-10 LAB — BASIC METABOLIC PANEL
BUN: 28 mg/dL — ABNORMAL HIGH (ref 6–23)
CHLORIDE: 102 meq/L (ref 96–112)
CO2: 32 meq/L (ref 19–32)
Calcium: 8.6 mg/dL (ref 8.4–10.5)
Creatinine, Ser: 0.72 mg/dL (ref 0.50–1.35)
GFR calc Af Amer: >90 mL/min (ref >90)
GFR calc non Af Amer: >90 mL/min (ref >90)
GLUCOSE: 154 mg/dL — AB (ref 70–99)
POTASSIUM: 4.3 meq/L (ref 3.7–5.3)
SODIUM: 141 meq/L (ref 137–147)

## 2013-06-10 LAB — CBC
HCT: 30.1 % — ABNORMAL LOW (ref 39.0–52.0)
Hemoglobin: 9.8 g/dL — ABNORMAL LOW (ref 13.0–17.0)
MCH: 33.6 pg (ref 26.0–34.0)
MCHC: 32.6 g/dL (ref 30.0–36.0)
MCV: 103.1 fL — ABNORMAL HIGH (ref 78.0–100.0)
PLATELETS: 200 10*3/uL (ref 150–400)
RBC: 2.92 MIL/uL — AB (ref 4.22–5.81)
RDW: 12.8 % (ref 11.5–15.5)
WBC: 7.3 10*3/uL (ref 4.0–10.5)

## 2013-06-10 LAB — CK TOTAL AND CKMB (NOT AT ARMC)
CK TOTAL: 623 U/L — AB (ref 7–232)
CK, MB: 3.6 ng/mL (ref 0.3–4.0)
RELATIVE INDEX: 0.6 (ref 0.0–2.5)

## 2013-06-10 LAB — PROTIME-INR
INR: 1.15 (ref 0.00–1.49)
Prothrombin Time: 14.5 seconds (ref 11.6–15.2)

## 2013-06-10 LAB — TRIGLYCERIDES: Triglycerides: 37 mg/dL (ref <150)

## 2013-06-10 LAB — PRO B NATRIURETIC PEPTIDE: PRO B NATRI PEPTIDE: 120.4 pg/mL (ref 0–125)

## 2013-06-10 LAB — APTT: aPTT: 31 seconds (ref 24–37)

## 2013-06-10 LAB — TROPONIN I

## 2013-06-10 LAB — LACTIC ACID, PLASMA: LACTIC ACID, VENOUS: 0.7 mmol/L (ref 0.5–2.2)

## 2013-06-10 MED ORDER — DEXMEDETOMIDINE HCL IN NACL 200 MCG/50ML IV SOLN
0.4000 ug/kg/h | INTRAVENOUS | Status: DC
  Administered 2013-06-10: 0.4 ug/kg/h via INTRAVENOUS
  Administered 2013-06-10: 1.2 ug/kg/h via INTRAVENOUS
  Administered 2013-06-10: 1 ug/kg/h via INTRAVENOUS
  Administered 2013-06-10: 1.2 ug/kg/h via INTRAVENOUS
  Administered 2013-06-10: 0.9 ug/kg/h via INTRAVENOUS
  Administered 2013-06-10: 0.8 ug/kg/h via INTRAVENOUS
  Administered 2013-06-11: 10:00:00 via INTRAVENOUS
  Administered 2013-06-11 – 2013-06-12 (×10): 0.8 ug/kg/h via INTRAVENOUS
  Administered 2013-06-12 (×4): 1 ug/kg/h via INTRAVENOUS
  Administered 2013-06-13: 0.4 ug/kg/h via INTRAVENOUS
  Filled 2013-06-10 (×23): qty 50

## 2013-06-10 MED ORDER — ACETAMINOPHEN 160 MG/5ML PO SOLN
650.0000 mg | Freq: Four times a day (QID) | ORAL | Status: DC | PRN
  Filled 2013-06-10: qty 20.3

## 2013-06-10 MED ORDER — ADULT MULTIVITAMIN LIQUID CH
5.0000 mL | Freq: Every day | ORAL | Status: DC
  Filled 2013-06-10: qty 5

## 2013-06-10 MED ORDER — PANTOPRAZOLE SODIUM 40 MG PO PACK
40.0000 mg | PACK | Freq: Every day | ORAL | Status: DC
  Administered 2013-06-10: 40 mg
  Filled 2013-06-10 (×3): qty 20

## 2013-06-10 MED ORDER — VANCOMYCIN HCL IN DEXTROSE 750-5 MG/150ML-% IV SOLN
750.0000 mg | Freq: Three times a day (TID) | INTRAVENOUS | Status: DC
  Administered 2013-06-10 – 2013-06-13 (×8): 750 mg via INTRAVENOUS
  Filled 2013-06-10 (×10): qty 150

## 2013-06-10 MED ORDER — INSULIN ASPART 100 UNIT/ML ~~LOC~~ SOLN
0.0000 [IU] | SUBCUTANEOUS | Status: DC
  Administered 2013-06-10: 3 [IU] via SUBCUTANEOUS
  Administered 2013-06-11 (×4): 2 [IU] via SUBCUTANEOUS
  Administered 2013-06-12: 1 [IU] via SUBCUTANEOUS
  Administered 2013-06-12 – 2013-06-14 (×7): 2 [IU] via SUBCUTANEOUS
  Administered 2013-06-14: 3 [IU] via SUBCUTANEOUS
  Administered 2013-06-17 – 2013-06-19 (×7): 2 [IU] via SUBCUTANEOUS

## 2013-06-10 NOTE — Consult Note (Signed)
[address]       [place] [address]             [phone]        James [name]  Medical Record #[id number] Date of Birth: [birth year]  Referring: No ref. provider found Primary Care: No primary provider on file.  Chief Complaint:    Chief Complaint  Patient presents with  . Fall   patient examined, radiographic studies including CT scan reviewed Consent for surgery for left hemothorax discussed with patient's sister [name] over the [phone]  History of Present Illness:     53 year old Caucasian male smoker was brought to the ED after falling off a 6 foot ladder on his back to the ground. He had difficulty breathing and left chest pain. His injuries included multiple left-sided minimally displaced rib fractures and a small pneumothorax. Head CT scan was negative. He had a comminuted fracture of the left clavicle. He was admitted to trauma and evaluated by orthopedics. CT scan of the abdomen was unremarkable. He had no hematuria. He did not lose consciousness.   The patient developed difficulty breathing, thick airway secretions and atelectasis of the left lung requiring intubation and bronchoscopy for clearance of secretions. His chest x-ray is continued to show enlarging left pleural effusion and a CT scan performed today shows probable loculated hemothorax, possible empyema. His had low-grade fever has been placed on broad-spectrum antibiotics. Epidural spinal catheter for narcotics was administered by anesthesia.  Because of worsening of his loculated left pleural collection a thoracic surgical evaluation was requested.  Current Activity/ Functional Status: Patient lives alone Patient works Architect Patient smokes His family contact his his sister [name] who will stay with the patient when he goes home   Zubrod Score: At the time of surgery this patient's most appropriate activity status/level should be described  as: []     0    Normal activity, no symptoms []     1    Restricted in physical strenuous activity but ambulatory, able to do out light work []     2    Ambulatory and capable of self care, unable to do work activities, up and about                 more than 50%  Of the time                            []     3    Only limited self care, in bed greater than 50% of waking hours [x]     4    Completely disabled, no self care, confined to bed or chair []     5    Moribund  Past Medical History  Diagnosis Date  . Pneumothorax     History reviewed. No pertinent past surgical history.  History  Smoking status  . Current Every Day Smoker  . Types: Cigarettes  Smokeless tobacco  . Not on file    History  Alcohol Use  . Yes    History   Social History  . Marital Status: Divorced    Spouse Name: N/A    Number of Children: N/A  . Years of Education: N/A   Occupational History  . Not on file.   Social History Main Topics  . Smoking status: Current Every Day Smoker    Types: Cigarettes  . Smokeless tobacco: Not on file  .  Alcohol Use: Yes  . Drug Use: Yes    Special: Marijuana  . Sexual Activity: Not on file   Other Topics Concern  . Not on file   Social History Narrative  . No narrative on file    No Known Allergies  Current Facility-Administered Medications  Medication Dose Route Frequency Provider Last Rate Last Dose  . acetaminophen (TYLENOL) suppository 650 mg  650 mg Rectal Q6H PRN Zenovia Jarred, MD      . antiseptic oral rinse (BIOTENE) solution 15 mL  15 mL Mouth Rinse QID Zenovia Jarred, MD   15 mL at 06/10/13 1112  . chlorhexidine (PERIDEX) 0.12 % solution 15 mL  15 mL Mouth Rinse BID Zenovia Jarred, MD   15 mL at 06/10/13 0759  . dexmedetomidine (PRECEDEX) 200 MCG/50ML infusion  0.4-1.2 mcg/kg/hr Intravenous Titrated Grace Bushy Minor, NP 21.6 mL/hr at 06/10/13 1255 1.2 mcg/kg/hr at 06/10/13 1255  . dextrose 5 %-0.45 % sodium chloride infusion   Intravenous  Continuous Emina Riebock, NP 100 mL/hr at 06/10/13 1317 52.9 mL at 06/10/13 1317  . diphenhydrAMINE (BENADRYL) injection 12.5-25 mg  12.5-25 mg Intravenous Q6H PRN Stark Klein, MD      . feeding supplement (PIVOT 1.5 CAL) liquid 1,000 mL  1,000 mL Per Tube Continuous Heather Cornelison Pitts, RD 50 mL/hr at 06/09/13 1900 1,000 mL at 06/09/13 1900  . fentaNYL (SUBLIMAZE) 10 mcg/mL in sodium chloride 0.9 % 250 mL infusion  50-250 mcg/hr Intravenous Continuous Zenovia Jarred, MD 15 mL/hr at 06/10/13 0815 150 mcg/hr at 06/10/13 0815  . fentaNYL (SUBLIMAZE) injection 100 mcg  100 mcg Intravenous Q2H PRN Zenovia Jarred, MD   100 mcg at 06/07/13 1204  . folic acid injection 1 mg  1 mg Intravenous Daily Zenovia Jarred, MD   1 mg at 06/10/13 1027  . heparin injection 5,000 Units  5,000 Units Subcutaneous 3 times per day Ivin Poot, MD   5,000 Units at 06/10/13 1450  . HYDROmorphone (DILAUDID) injection 0.5-1 mg  0.5-1 mg Intravenous Q2H PRN Stark Klein, MD   1 mg at 06/06/13 2149  . ipratropium-albuterol (DUONEB) 0.5-2.5 (3) MG/3ML nebulizer solution 3 mL  3 mL Nebulization Q4H Melina Schools, MD   3 mL at 06/10/13 1151  . ipratropium-albuterol (DUONEB) 0.5-2.5 (3) MG/3ML nebulizer solution 3 mL  3 mL Nebulization Q2H PRN Melina Schools, MD      . Derrill Memo ON 06/11/2013] multivitamin liquid 5 mL  5 mL Per Tube Daily Brand Males, MD      . ondansetron (ZOFRAN) tablet 4 mg  4 mg Oral Q6H PRN Stark Klein, MD       Or  . ondansetron (ZOFRAN) injection 4 mg  4 mg Intravenous Q6H PRN Stark Klein, MD      . pantoprazole sodium (PROTONIX) 40 mg/20 mL oral suspension 40 mg  40 mg Per Tube Daily Brand Males, MD      . piperacillin-tazobactam (ZOSYN) IVPB 3.375 g  3.375 g Intravenous Q8H Thuy Dien Dang, RPH   3.375 g at 06/10/13 1425  . ropivacaine (PF) 2 mg/ml (0.2%) (NAROPIN) epidural  8 mL/hr Epidural Continuous Laurie Panda, MD 8 mL/hr at 06/10/13 1441 8 mL/hr at 06/10/13 1441  . sodium chloride  0.9 % injection 3 mL  3 mL Intravenous Q12H Emina Riebock, NP   3 mL at 06/10/13 1000  . sodium chloride 0.9 % injection 3 mL  3 mL Intravenous PRN Erby Pian, NP      .  thiamine (VITAMIN B-1) tablet 100 mg  100 mg Oral Daily Emina Riebock, NP   100 mg at 06/10/13 1027   Or  . thiamine (B-1) injection 100 mg  100 mg Intravenous Daily Emina Riebock, NP   100 mg at 06/08/13 0919  . vancomycin (VANCOCIN) IVPB 750 mg/150 ml premix  750 mg Intravenous Q8H Manley Mason, [facility]        No prescriptions prior to admission    History reviewed. No pertinent family history.   Review of Systems: Patient is intubated and is unable to provide review of systems                                      The patient's sister states he has not had previous chest surgery   Cardiac Review of Systems: Y or N  Chest Pain [    ]  Resting SOB [   ] Exertional SOB  [  ]  Orthopnea [  ]   Pedal Edema [   ]    Palpitations [  ] Syncope  [  ]   Presyncope [   ]  General Review of Systems: [Y] = yes [  ]=no Constitional: recent weight change [  ]; anorexia [  ]; fatigue [  ]; nausea [  ]; night sweats [  ]; fever [  ]; or chills [  ]                                                               Dental: poor dentition[  ]; Last Dentist visit:   Eye : blurred vision [  ]; diplopia [   ]; vision changes [  ];  Amaurosis fugax[  ]; Resp: cough [  ];  wheezing[  ];  hemoptysis[  ]; shortness of breath[  ]; paroxysmal nocturnal dyspnea[  ]; dyspnea on exertion[  ]; or orthopnea[  ];  GI:  gallstones[  ], vomiting[  ];  dysphagia[  ]; melena[  ];  hematochezia [  ]; heartburn[  ];   Hx of  Colonoscopy[  ]; GU: kidney stones [  ]; hematuria[  ];   dysuria [  ];  nocturia[  ];  history of     obstruction [  ]; urinary frequency [  ]             Skin: rash, swelling[  ];, hair loss[  ];  peripheral edema[  ];  or itching[  ]; Musculosketetal: myalgias[  ];  joint swelling[  ];  joint erythema[  ];  joint pain[  ];  back pain[   ];  Heme/Lymph: bruising[  ];  bleeding[  ];  anemia[  ];  Neuro: TIA[  ];  headaches[  ];  stroke[  ];  vertigo[  ];  seizures[  ];   paresthesias[  ];  difficulty walking[  ];  Psych:depression[  ]; anxiety[  ];  Endocrine: diabetes[  ];  thyroid dysfunction[  ];  Immunizations: Flu [  ]; Pneumococcal[  ];  Other:  Physical Exam: BP 131/77  Pulse 76  Temp(Src) 98.8 F (37.1 C) (Axillary)  Resp 20  Ht 5\' 9"  (1.753 m)  Wt 158 lb 8.2 oz (71.9 kg)  BMI 23.40 kg/m2  SpO2 98%  Gen.-intubated sedated on ventilator with stable vital signs HEENT normocephalic poor dentition Neck-no crepitus or mass Thorax-no deformity diminished breath sounds at left base Cardiac-regular rhythm without murmur or rub Abdomen-soft nontender without organomegaly or pulsatile mass Extremities-mild clubbing no cyanosis Vascular-palpable pulses in all extremities Neurologic-nonresponsive sedated on vent   Diagnostic Studies & Laboratory data:     Recent Radiology Findings:   Ct Chest Wo Contrast  06/09/2013   CLINICAL DATA:  Respiratory failure. Left lung atelectasis. Left rib fractures and hemothorax. On ventilator.  EXAM: CT CHEST WITHOUT CONTRAST  TECHNIQUE: Multidetector CT imaging of the chest was performed following the standard protocol without IV contrast.  COMPARISON:  06/04/2013  FINDINGS: Multiple left-sided rib fractures again noted. Increased size of left pleural effusion demonstrated with some high attenuation areas likely representing hemothorax. There is also increased compressive atelectasis of left lower lobe.  New infiltrate is seen in the posterior right lower lobe. No evidence of right-sided pleural effusion. Mild pulmonary emphysema noted. No hilar or mediastinal masses are identified. Endotracheal tube and nasogastric tube are seen in place.  IMPRESSION: Increased moderate left hemothorax and compressive left lower lobe atelectasis.  Multiple left rib fractures again noted. No pneumothorax  identified.  New infiltrate or atelectasis in the posterior right lower lobe. Pneumonia cannot be excluded.  Mild emphysema.   Electronically Signed   By: Earle Gell M.D.   On: 06/09/2013 18:52   Dg Chest Port 1 View  06/10/2013   CLINICAL DATA:  Endotracheal tube placement.  EXAM: PORTABLE CHEST - 1 VIEW  COMPARISON:  06/09/2013 as well as chest CT 06/09/2013  FINDINGS: Endotracheal tube has tip 6.6 cm above the carina. Nasogastric tube has hypo over the stomach in the left upper quadrant as tip is not visualized. Subclavian central venous catheter is unchanged. Right lung is clear. There is persistent opacification over the left lung which is unchanged compatible patient's known moderate pleural effusion/hemothorax with associated atelectasis. Remainder the exam is unchanged including several left rib fractures.  IMPRESSION: Stable opacification over the left lung compatible with known effusion/hemothorax with associated atelectasis.  Known multiple left rib fractures.  Tubes and lines as described.   Electronically Signed   By: Marin Olp M.D.   On: 06/10/2013 08:58   Dg Chest Port 1 View  06/09/2013   CLINICAL DATA:  Endotracheal tube  EXAM: PORTABLE CHEST - 1 VIEW  COMPARISON:  Prior radiograph from 06/08/2013  FINDINGS: The tip of the endotracheal tube is positioned approximately 4.2 cm above the carina. Enteric tube courses into the abdomen. Right subclavian central venous catheter is unchanged. Cardiac and mediastinal silhouettes are stable.  A moderate left pleural effusion is again CN, which appears loculated laterally. Associated left mid and lower lung opacities and volume loss are not significantly changed. The right lung remains clear. No pneumothorax or pulmonary edema.  Multiple left-sided rib fractures again noted. Remote right-sided rib fractures again noted.  IMPRESSION: 1. Stable support apparatus with tip of the endotracheal tube approximately 4.2 cm above the carina. 2. Grossly  unchanged loculated left pleural effusion with associated left mid and lower lung opacities, which may reflect atelectasis versus contusion. 3. Unchanged minimally displaced left-sided rib fractures. 4. Unchanged remote/healed right-sided rib fractures.   Electronically Signed   By: Jeannine Boga M.D.   On: 06/09/2013 07:00      Recent Lab Findings: Lab Results  Component Value  Date   WBC 7.3 06/10/2013   HGB 9.8* 06/10/2013   HCT 30.1* 06/10/2013   PLT 200 06/10/2013   GLUCOSE 154* 06/10/2013   TRIG 37 06/10/2013   NA 141 06/10/2013   K 4.3 06/10/2013   CL 102 06/10/2013   CREATININE 0.72 06/10/2013   BUN 28* 06/10/2013   CO2 32 06/10/2013   INR 1.15 06/10/2013      Assessment / Plan:      Left hemothorax, enlarging over the past 3 days Left multiple rib fractures, with mild displacement Anemia  heavy smoking history  Recommend repeat bronchoscopy for airway toilet then left VATS for decortication of left basilar hemoothorax with drainage of effusion-empyema  I discussed the procedure of VATS with the patient's sister and she understands the reasons for surgery, alternatives to surgery, expected recovery, the potential risks of bleeding, recurrent infection, prolonged ventilator dependence,-she understands and agrees with surgery.     @ME1 @ 06/10/2013 3:31 PM

## 2013-06-10 NOTE — Progress Notes (Signed)
PULMONARY / CRITICAL CARE MEDICINE   Name: James [name] MRN: [id number] DOB: 1960-02-11    ADMISSION DATE:  06/04/2013 CONSULTATION DATE:  06/08/2013  REFERRING MD :  Dr. Hulen Skains PRIMARY SERVICE: Trauma  CHIEF COMPLAINT:  Respiratory failure  BRIEF PATIENT DESCRIPTION: 53 year old male who fell of a ladder 6/2 and suffered multiple rib fractures on the left and clavicular fracture.  On 6/4 the patient developed complete whiteout of his left lung and was subsequently intubated and bronched.  Successful re-expansion of the left lung.  PCCM asked to cross cover for the weekend.  Significant Events/Studies 6/4 respiratory failure and atelectasis of the left lung. Fx ribs. 6/6 ct chest:Increased moderate left hemothorax and compressive left lower lobe  atelectasis.    LINES / TUBES: ETT 6/4>>> R [place] TLC 6/2>>> 6/2 epidural>>  CULTURES: BAL 6/4>>>nl flora  ANTIBIOTICS: Vanc 6/4>>>6/7 Zosyn 6/4>>>   SUBJECTIVE:  Comfortable on vent ps/cpap 5/5 with rr 18 Vt 456  VITAL SIGNS: Temp:  [97.9 F (36.6 C)-100.1 F (37.8 C)] 97.9 F (36.6 C) (06/07 0800) Pulse Rate:  [79-99] 83 (06/07 0800) Resp:  [14-23] 17 (06/07 0900) BP: (93-118)/(53-77) 118/64 mmHg (06/07 0800) SpO2:  [91 %-100 %] 94 % (06/07 0900) FiO2 (%):  [40 %-50 %] 40 % (06/07 0900) Weight:  [71.9 kg (158 lb 8.2 oz)] 71.9 kg (158 lb 8.2 oz) (06/07 0440) HEMODYNAMICS: CVP:  [1 mmHg-28 mmHg] 3 mmHg VENTILATOR SETTINGS: Vent Mode:  [-] PSV;CPAP FiO2 (%):  [40 %-50 %] 40 % Set Rate:  [20 bmp] 20 bmp Vt Set:  [550 mL] 550 mL PEEP:  [5 cmH20] 5 cmH20 Pressure Support:  [5 cmH20] 5 cmH20 Plateau Pressure:  [13 cmH20-18 cmH20] 16 cmH20 INTAKE / OUTPUT: Intake/Output     06/06 0701 - 06/07 0700 06/07 0701 - 06/08 0700   I.V. (mL/kg) 1748 (24.3) 135.8 (1.9)   Other 184 16   NG/GT 1230 100   IV Piggyback 600 25   Total Intake(mL/kg) 3762 (52.3) 276.8 (3.8)   Urine (mL/kg/hr) 1365 (0.8) 125 (0.6)   Total Output 1365 125    Net +2397 +151.8          PHYSICAL EXAMINATION: General:  Chronically ill appearing male, sedated, NAD Neuro:  Awake and follows commands HEENT:  [place]/AT, PERRL, EOM-spontaneous, MMM. Cardiovascular:  RRR, Nl S1/S2, -M/R/G. Lungs:  Coarse BS diffusely, crackles L>R. Abdomen:  Soft, NT, ND and +BS. Musculoskeletal:  -edema.  Clavicular fracture with arm in a sling. Skin:  Intact.  LABS:  CBC  Recent Labs Lab 06/08/13 0615 06/09/13 0440 06/10/13 0515  WBC 12.4* 9.6 7.3  HGB 11.9* 10.7* 9.8*  HCT 35.4* 32.2* 30.1*  PLT 172 164 200   Coag's  Recent Labs Lab 06/06/13 0955  INR 1.00   BMET  Recent Labs Lab 06/08/13 0615 06/09/13 0440 06/10/13 0515  NA 138 137 141  K 5.0 4.4 4.3  CL 97 98 102  CO2 32 32 32  BUN 22 28* 28*  CREATININE 0.75 0.73 0.72  GLUCOSE 145* 144* 154*   Electrolytes  Recent Labs Lab 06/08/13 0615 06/09/13 0440 06/10/13 0515  CALCIUM 9.1 8.6 8.6  MG  --  2.5  --   PHOS  --  3.8  --    Sepsis Markers  Recent Labs Lab 06/10/13 0827  LATICACIDVEN 0.7   ABG  Recent Labs Lab 06/08/13 0026 06/08/13 0335 06/09/13 0430  PHART 7.356 7.398 7.383  PCO2ART 61.3* 53.3* 55.8*  PO2ART  159.0* 90.9 69.6*   Liver Enzymes No results found for this basename: AST, ALT, ALKPHOS, BILITOT, ALBUMIN,  in the last 168 hours Cardiac Enzymes  Recent Labs Lab 06/10/13 0500  TROPONINI <0.30  PROBNP 120.4   Glucose  Recent Labs Lab 06/09/13 1139 06/09/13 1515 06/09/13 2052 06/09/13 2330 06/10/13 0437 06/10/13 0743  GLUCAP 131* 97 103* 123* 117* 124*    Imaging Ct Chest Wo Contrast  06/09/2013   CLINICAL DATA:  Respiratory failure. Left lung atelectasis. Left rib fractures and hemothorax. On ventilator.  EXAM: CT CHEST WITHOUT CONTRAST  TECHNIQUE: Multidetector CT imaging of the chest was performed following the standard protocol without IV contrast.  COMPARISON:  06/04/2013  FINDINGS: Multiple left-sided rib fractures again noted.  Increased size of left pleural effusion demonstrated with some high attenuation areas likely representing hemothorax. There is also increased compressive atelectasis of left lower lobe.  New infiltrate is seen in the posterior right lower lobe. No evidence of right-sided pleural effusion. Mild pulmonary emphysema noted. No hilar or mediastinal masses are identified. Endotracheal tube and nasogastric tube are seen in place.  IMPRESSION: Increased moderate left hemothorax and compressive left lower lobe atelectasis.  Multiple left rib fractures again noted. No pneumothorax identified.  New infiltrate or atelectasis in the posterior right lower lobe. Pneumonia cannot be excluded.  Mild emphysema.   Electronically Signed   By: Earle Gell M.D.   On: 06/09/2013 18:52   Dg Chest Port 1 View  06/10/2013   CLINICAL DATA:  Endotracheal tube placement.  EXAM: PORTABLE CHEST - 1 VIEW  COMPARISON:  06/09/2013 as well as chest CT 06/09/2013  FINDINGS: Endotracheal tube has tip 6.6 cm above the carina. Nasogastric tube has hypo over the stomach in the left upper quadrant as tip is not visualized. Subclavian central venous catheter is unchanged. Right lung is clear. There is persistent opacification over the left lung which is unchanged compatible patient's known moderate pleural effusion/hemothorax with associated atelectasis. Remainder the exam is unchanged including several left rib fractures.  IMPRESSION: Stable opacification over the left lung compatible with known effusion/hemothorax with associated atelectasis.  Known multiple left rib fractures.  Tubes and lines as described.   Electronically Signed   By: Marin Olp M.D.   On: 06/10/2013 08:58   Dg Chest Port 1 View  06/09/2013   CLINICAL DATA:  Endotracheal tube  EXAM: PORTABLE CHEST - 1 VIEW  COMPARISON:  Prior radiograph from 06/08/2013  FINDINGS: The tip of the endotracheal tube is positioned approximately 4.2 cm above the carina. Enteric tube courses into the  abdomen. Right subclavian central venous catheter is unchanged. Cardiac and mediastinal silhouettes are stable.  A moderate left pleural effusion is again CN, which appears loculated laterally. Associated left mid and lower lung opacities and volume loss are not significantly changed. The right lung remains clear. No pneumothorax or pulmonary edema.  Multiple left-sided rib fractures again noted. Remote right-sided rib fractures again noted.  IMPRESSION: 1. Stable support apparatus with tip of the endotracheal tube approximately 4.2 cm above the carina. 2. Grossly unchanged loculated left pleural effusion with associated left mid and lower lung opacities, which may reflect atelectasis versus contusion. 3. Unchanged minimally displaced left-sided rib fractures. 4. Unchanged remote/healed right-sided rib fractures.   Electronically Signed   By: Jeannine Boga M.D.   On: 06/09/2013 07:00     ASSESSMENT / PLAN:  PULMONARY A: VDRF likely due to splinting and rib fractures. Hemothorax P:   - SBT daily -  CVTS consult for hemothorax - keep intubated until dispo on VATS - daily CXR  CARDIOVASCULAR A: No current active issues. P:  - Monitor BP on propofol and fentanyl.  RENAL A:  Nil acute P:   - KVO IVF. - TF per trauma  - BMET i - Correct electrolytes as indicated.  GASTROINTESTINAL A:  No active issues. P:   - tf  HEMATOLOGIC A:  Mild anemia of critical illness P:  - Monitor CBC's daily. - Transfusion per ICU protocol.; for hgb  7gm%  INFECTIOUS A:  Cannot exclude CAP left lung P:   - F/U on respiratory cultures. Nl flora - Continue Zosyn 7 days - 6/7 dc vanc.  ENDOCRINE A:  Hyperglycemia with no history of DM.   P:   - Monitor for now, if persistently elevated then will consider ISS.  Would like to see glucose after initiation of TF.  NEUROLOGIC A:  No active issues. P:   RASS goal: 0 to -2. -precedex, fentanyl gtt  TODAY'S SUMMARY:  Intubated and with  multiple rib fxs. Fio2 40 % currently with 5/5 ps/cpap   Richardson Landry Minor ACNP Maryanna Shape PCCM Pager [phone] till 3 pm If no answer page [phone] 06/10/2013, 9:56 AM   Attending:  I have seen and examined the patient with nurse practitioner/resident and agree with the note above.   CC time 35 minutes  Roselie Awkward, MD [facility] Pager: [phone] Cell: [phone] If no response, call [phone]

## 2013-06-10 NOTE — Progress Notes (Signed)
Epidural Followup: Awake alert intubated. Pain seems controlled. Ropivicaine 8gtts , Fentanyl 150 ug /hr. There is plan to add Precedex. Have found this reduces our fentanyl need by 25-30% in the OR,, So may have opportunity to to reduce Fentanyl infusion slightly. As yet no plan for extubation.  Will follow .  TMassagee, Anesth

## 2013-06-10 NOTE — Progress Notes (Signed)
Seen, agree with above.  Discussed effusion with Dr. Prescott Gum.  May need VATS for hemothorax.

## 2013-06-10 NOTE — Progress Notes (Signed)
Patient ID: James [name], male   DOB: 06-07-1960, 53 y.o.   MRN: [id number]  LOS: 6 days   Subjective: Awake on vent.  Afebrile. Normal white count. VSS.    Objective: Vital signs in last 24 hours: Temp:  [97.9 F (36.6 C)-100.1 F (37.8 C)] 97.9 F (36.6 C) (06/07 0800) Pulse Rate:  [79-99] 79 (06/07 0700) Resp:  [19-23] 20 (06/07 0700) BP: (93-110)/(52-77) 100/66 mmHg (06/07 0700) SpO2:  [91 %-100 %] 100 % (06/07 0754) FiO2 (%):  [40 %-50 %] 40 % (06/07 0754) Weight:  [158 lb 8.2 oz (71.9 kg)] 158 lb 8.2 oz (71.9 kg) (06/07 0440) Last BM Date:  (PTA)  Lab Results:  CBC  Recent Labs  06/09/13 0440 06/10/13 0515  WBC 9.6 7.3  HGB 10.7* 9.8*  HCT 32.2* 30.1*  PLT 164 200   BMET  Recent Labs  06/09/13 0440 06/10/13 0515  NA 137 141  K 4.4 4.3  CL 98 102  CO2 32 32  GLUCOSE 144* 154*  BUN 28* 28*  CREATININE 0.73 0.72  CALCIUM 8.6 8.6    Imaging: Ct Chest Wo Contrast  06/09/2013   CLINICAL DATA:  Respiratory failure. Left lung atelectasis. Left rib fractures and hemothorax. On ventilator.  EXAM: CT CHEST WITHOUT CONTRAST  TECHNIQUE: Multidetector CT imaging of the chest was performed following the standard protocol without IV contrast.  COMPARISON:  06/04/2013  FINDINGS: Multiple left-sided rib fractures again noted. Increased size of left pleural effusion demonstrated with some high attenuation areas likely representing hemothorax. There is also increased compressive atelectasis of left lower lobe.  New infiltrate is seen in the posterior right lower lobe. No evidence of right-sided pleural effusion. Mild pulmonary emphysema noted. No hilar or mediastinal masses are identified. Endotracheal tube and nasogastric tube are seen in place.  IMPRESSION: Increased moderate left hemothorax and compressive left lower lobe atelectasis.  Multiple left rib fractures again noted. No pneumothorax identified.  New infiltrate or atelectasis in the posterior right lower lobe. Pneumonia  cannot be excluded.  Mild emphysema.   Electronically Signed   By: Earle Gell M.D.   On: 06/09/2013 18:52   Dg Chest Port 1 View  06/09/2013   CLINICAL DATA:  Endotracheal tube  EXAM: PORTABLE CHEST - 1 VIEW  COMPARISON:  Prior radiograph from 06/08/2013  FINDINGS: The tip of the endotracheal tube is positioned approximately 4.2 cm above the carina. Enteric tube courses into the abdomen. Right subclavian central venous catheter is unchanged. Cardiac and mediastinal silhouettes are stable.  A moderate left pleural effusion is again CN, which appears loculated laterally. Associated left mid and lower lung opacities and volume loss are not significantly changed. The right lung remains clear. No pneumothorax or pulmonary edema.  Multiple left-sided rib fractures again noted. Remote right-sided rib fractures again noted.  IMPRESSION: 1. Stable support apparatus with tip of the endotracheal tube approximately 4.2 cm above the carina. 2. Grossly unchanged loculated left pleural effusion with associated left mid and lower lung opacities, which may reflect atelectasis versus contusion. 3. Unchanged minimally displaced left-sided rib fractures. 4. Unchanged remote/healed right-sided rib fractures.   Electronically Signed   By: Jeannine Boga M.D.   On: 06/09/2013 07:00     PE:  General appearance: awake on vent  Resp: CTA  Cardio: regular rate and rhythm, S1, S2 normal, no murmur, click, rub or gallop  GI: soft, non-tender; bowel sounds normal; no masses, no organomegaly  Extremities: extremities normal, atraumatic, no cyanosis or  edema    Patient Active Problem List   Diagnosis Date Noted  . Acute respiratory failure 06/08/2013  . Altered mental status 06/08/2013  . Pneumonia, organism unspecified 06/08/2013  . Fall from ladder 06/05/2013    Assessment/Plan:  Fall from ladder  Acute respiratory failure/Multiple left sided rib fracture/HPTX - bronchoscopy, intubated 6/4, suction PRN, nebs  -CT  with a left HTX, no PTX, will discuss with Dr. Barry Dienes if CT placement is needed Left comminuted clavicle fracture-Dr. Rolena Infante following. Figure 8 strap, outpatient follow up with Dr. Onnie Graham  VTE - SCD's, heparin  ID - Vanc Zosyn empiric, BAL negative to date  FEN - TF per OGT  Hx opioid dependence  Dispo -- continue ICU  Erby Pian, ANP-BC Pager: [phone] General Trauma PA Pager: [phone]    06/10/2013 8:14 AM

## 2013-06-11 ENCOUNTER — Inpatient Hospital Stay (HOSPITAL_COMMUNITY): Payer: Self-pay | Admitting: Anesthesiology

## 2013-06-11 ENCOUNTER — Encounter (HOSPITAL_COMMUNITY): Payer: Self-pay | Admitting: Anesthesiology

## 2013-06-11 ENCOUNTER — Encounter (HOSPITAL_COMMUNITY): Admission: EM | Disposition: A | Discharge status: Rehabilitation Facility | Payer: Self-pay | Source: Home / Self Care

## 2013-06-11 ENCOUNTER — Inpatient Hospital Stay (HOSPITAL_COMMUNITY): Payer: Self-pay

## 2013-06-11 ENCOUNTER — Encounter (HOSPITAL_COMMUNITY): Payer: MEDICAID | Admitting: Anesthesiology

## 2013-06-11 DIAGNOSIS — S271XXA Traumatic hemothorax, initial encounter: Secondary | ICD-10-CM

## 2013-06-11 DIAGNOSIS — J942 Hemothorax: Secondary | ICD-10-CM | POA: Diagnosis present

## 2013-06-11 DIAGNOSIS — D62 Acute posthemorrhagic anemia: Secondary | ICD-10-CM

## 2013-06-11 HISTORY — PX: VIDEO ASSISTED THORACOSCOPY (VATS)/THOROCOTOMY: SHX6173

## 2013-06-11 HISTORY — PX: VIDEO BRONCHOSCOPY: SHX5072

## 2013-06-11 LAB — BLOOD GAS, ARTERIAL
Acid-Base Excess: 3.1 mmol/L — ABNORMAL HIGH (ref 0.0–2.0)
Bicarbonate: 27.8 mEq/L — ABNORMAL HIGH (ref 20.0–24.0)
Drawn by: 129711
FIO2: 0.4 %
MECHVT: 0.55 mL
O2 Saturation: 95.9 %
PEEP: 5 cmH2O
Patient temperature: 98.6
RATE: 20 resp/min
TCO2: 29.3 mmol/L (ref 0–100)
pCO2 arterial: 48.1 mmHg — ABNORMAL HIGH (ref 35.0–45.0)
pH, Arterial: 7.38 (ref 7.350–7.450)
pO2, Arterial: 69.5 mmHg — ABNORMAL LOW (ref 80.0–100.0)

## 2013-06-11 LAB — GLUCOSE, CAPILLARY
GLUCOSE-CAPILLARY: 119 mg/dL — AB (ref 70–99)
Glucose-Capillary: 133 mg/dL — ABNORMAL HIGH (ref 70–99)
Glucose-Capillary: 122 mg/dL — ABNORMAL HIGH (ref 70–99)
Glucose-Capillary: 105 mg/dL — ABNORMAL HIGH (ref 70–99)
Glucose-Capillary: 131 mg/dL — ABNORMAL HIGH (ref 70–99)

## 2013-06-11 LAB — CBC
HCT: 33.6 % — ABNORMAL LOW (ref 39.0–52.0)
Hemoglobin: 11.2 g/dL — ABNORMAL LOW (ref 13.0–17.0)
MCH: 32.9 pg (ref 26.0–34.0)
MCHC: 33.3 g/dL (ref 30.0–36.0)
MCV: 98.8 fL (ref 78.0–100.0)
Platelets: 226 10*3/uL (ref 150–400)
RBC: 3.4 MIL/uL — ABNORMAL LOW (ref 4.22–5.81)
RDW: 14.1 % (ref 11.5–15.5)
WBC: 12.2 10*3/uL — ABNORMAL HIGH (ref 4.0–10.5)

## 2013-06-11 LAB — ABO/RH: ABO/RH(D): A POS

## 2013-06-11 LAB — PREPARE RBC (CROSSMATCH)

## 2013-06-11 SURGERY — BRONCHOSCOPY, VIDEO-ASSISTED
Anesthesia: General | Site: Chest

## 2013-06-11 MED ORDER — BISACODYL 5 MG PO TBEC
10.0000 mg | DELAYED_RELEASE_TABLET | Freq: Every day | ORAL | Status: DC
  Administered 2013-06-11: 10 mg via ORAL
  Filled 2013-06-11 (×2): qty 2

## 2013-06-11 MED ORDER — SENNOSIDES-DOCUSATE SODIUM 8.6-50 MG PO TABS
1.0000 | ORAL_TABLET | Freq: Every day | ORAL | Status: DC
  Administered 2013-06-11 – 2013-06-21 (×6): 1 via ORAL
  Filled 2013-06-11 (×12): qty 1

## 2013-06-11 MED ORDER — ALBUMIN HUMAN 5 % IV SOLN
INTRAVENOUS | Status: DC | PRN
  Administered 2013-06-11: 09:00:00 via INTRAVENOUS

## 2013-06-11 MED ORDER — POTASSIUM CHLORIDE 10 MEQ/50ML IV SOLN
10.0000 meq | Freq: Every day | INTRAVENOUS | Status: DC | PRN
  Administered 2013-06-15: 10 meq via INTRAVENOUS
  Filled 2013-06-11 (×2): qty 50

## 2013-06-11 MED ORDER — BUPIVACAINE HCL (PF) 0.5 % IJ SOLN
INTRAMUSCULAR | Status: DC | PRN
  Administered 2013-06-11: 10 mL

## 2013-06-11 MED ORDER — BUPIVACAINE 0.5 % ON-Q PUMP SINGLE CATH 400 ML
INJECTION | Status: DC | PRN
  Administered 2013-06-11: 400 mL

## 2013-06-11 MED ORDER — ONDANSETRON HCL 4 MG/2ML IJ SOLN: 4.0000 mg | Freq: Four times a day (QID) | INTRAMUSCULAR | Status: DC | PRN

## 2013-06-11 MED ORDER — 0.9 % SODIUM CHLORIDE (POUR BTL) OPTIME
TOPICAL | Status: DC | PRN
  Administered 2013-06-11: 1000 mL

## 2013-06-11 MED ORDER — VECURONIUM BROMIDE 10 MG IV SOLR
INTRAVENOUS | Status: DC | PRN
  Administered 2013-06-11: 10 mg via INTRAVENOUS

## 2013-06-11 MED ORDER — ROCURONIUM BROMIDE 100 MG/10ML IV SOLN
INTRAVENOUS | Status: DC | PRN
  Administered 2013-06-11: 10 mg via INTRAVENOUS
  Administered 2013-06-11: 20 mg via INTRAVENOUS
  Administered 2013-06-11: 10 mg via INTRAVENOUS
  Administered 2013-06-11: 30 mg via INTRAVENOUS
  Administered 2013-06-11 (×2): 10 mg via INTRAVENOUS
  Administered 2013-06-11: 50 mg via INTRAVENOUS
  Administered 2013-06-11: 10 mg via INTRAVENOUS

## 2013-06-11 MED ORDER — HEMOSTATIC AGENTS (NO CHARGE) OPTIME
TOPICAL | Status: DC | PRN
  Administered 2013-06-11 (×2): 1 via TOPICAL

## 2013-06-11 MED ORDER — LACTATED RINGERS IV SOLN
INTRAVENOUS | Status: DC | PRN
  Administered 2013-06-11 (×2): via INTRAVENOUS

## 2013-06-11 MED ORDER — SODIUM CHLORIDE 0.9 % IV SOLN
INTRAVENOUS | Status: DC | PRN
  Administered 2013-06-11: 10:00:00 via INTRAVENOUS

## 2013-06-11 MED ORDER — PROPOFOL 10 MG/ML IV BOLUS
INTRAVENOUS | Status: AC
  Filled 2013-06-11: qty 20

## 2013-06-11 MED ORDER — FENTANYL CITRATE 0.05 MG/ML IJ SOLN
INTRAMUSCULAR | Status: AC
  Filled 2013-06-11: qty 5

## 2013-06-11 MED ORDER — MIDAZOLAM HCL 2 MG/2ML IJ SOLN
INTRAMUSCULAR | Status: AC
  Filled 2013-06-11: qty 2

## 2013-06-11 MED ORDER — ACETAMINOPHEN 160 MG/5ML PO SOLN
1000.0000 mg | Freq: Four times a day (QID) | ORAL | Status: AC
  Administered 2013-06-11 – 2013-06-12 (×5): 1000 mg via ORAL
  Filled 2013-06-11 (×21): qty 40

## 2013-06-11 MED ORDER — STERILE WATER FOR INJECTION IJ SOLN
INTRAMUSCULAR | Status: AC
  Filled 2013-06-11: qty 10

## 2013-06-11 MED ORDER — VECURONIUM BROMIDE 10 MG IV SOLR
INTRAVENOUS | Status: AC
  Filled 2013-06-11: qty 10

## 2013-06-11 MED ORDER — BUPIVACAINE HCL (PF) 0.5 % IJ SOLN
INTRAMUSCULAR | Status: AC
  Filled 2013-06-11: qty 10

## 2013-06-11 MED ORDER — ACETAMINOPHEN 500 MG PO TABS
1000.0000 mg | ORAL_TABLET | Freq: Four times a day (QID) | ORAL | Status: AC
  Administered 2013-06-12 – 2013-06-15 (×9): 1000 mg via ORAL
  Filled 2013-06-11 (×17): qty 2

## 2013-06-11 MED ORDER — BUPIVACAINE 0.5 % ON-Q PUMP SINGLE CATH 400 ML
400.0000 mL | INJECTION | Status: DC
  Filled 2013-06-11: qty 400

## 2013-06-11 MED ORDER — PIVOT 1.5 CAL PO LIQD
1000.0000 mL | ORAL | Status: DC
  Administered 2013-06-11: 1000 mL
  Filled 2013-06-11 (×2): qty 1000

## 2013-06-11 MED ORDER — ROCURONIUM BROMIDE 50 MG/5ML IV SOLN
INTRAVENOUS | Status: AC
  Filled 2013-06-11: qty 3

## 2013-06-11 MED ORDER — FENTANYL CITRATE 0.05 MG/ML IJ SOLN
INTRAMUSCULAR | Status: DC | PRN
Start: 2013-06-11 — End: 2013-06-11
  Administered 2013-06-11: 50 ug via INTRAVENOUS

## 2013-06-11 MED ORDER — DEXMEDETOMIDINE HCL IN NACL 200 MCG/50ML IV SOLN
INTRAVENOUS | Status: AC
  Filled 2013-06-11: qty 50

## 2013-06-11 MED ORDER — LACTATED RINGERS IV SOLN
INTRAVENOUS | Status: DC | PRN
  Administered 2013-06-11: 08:00:00 via INTRAVENOUS

## 2013-06-11 SURGICAL SUPPLY — 104 items
ADH SKN CLS APL DERMABOND .7 (GAUZE/BANDAGES/DRESSINGS) ×2
APL SRG 22X2 LUM MLBL SLNT (VASCULAR PRODUCTS) ×4
APPLICATOR TIP EXT COSEAL (VASCULAR PRODUCTS) ×8 IMPLANT
BALL CTTN LRG ABS STRL LF (GAUZE/BANDAGES/DRESSINGS) ×2
BLADE SURG 11 STRL SS (BLADE) ×4 IMPLANT
BRUSH CYTOL CELLEBRITY 1.5X140 (MISCELLANEOUS) IMPLANT
CANISTER SUCTION 2500CC (MISCELLANEOUS) ×6 IMPLANT
CATH KIT ON Q 5IN SLV (PAIN MANAGEMENT) ×4 IMPLANT
CATH ROBINSON RED A/P 22FR (CATHETERS) IMPLANT
CATH THORACIC 28FR (CATHETERS) ×4 IMPLANT
CATH THORACIC 36FR (CATHETERS) ×3 IMPLANT
CATH THORACIC 36FR RT ANG (CATHETERS) ×3 IMPLANT
CONN Y 3/8X3/8X3/8  BEN (MISCELLANEOUS) ×4
CONN Y 3/8X3/8X3/8 BEN (MISCELLANEOUS) IMPLANT
CONT SPEC 4OZ CLIKSEAL STRL BL (MISCELLANEOUS) ×8 IMPLANT
COTTONBALL LRG STERILE PKG (GAUZE/BANDAGES/DRESSINGS) ×3 IMPLANT
COVER SURGICAL LIGHT HANDLE (MISCELLANEOUS) ×4 IMPLANT
COVER TABLE BACK 60X90 (DRAPES) ×4 IMPLANT
DERMABOND ADVANCED (GAUZE/BANDAGES/DRESSINGS) ×2
DERMABOND ADVANCED .7 DNX12 (GAUZE/BANDAGES/DRESSINGS) ×2 IMPLANT
DRAPE LAPAROSCOPIC ABDOMINAL (DRAPES) ×4 IMPLANT
DRAPE WARM FLUID 44X44 (DRAPE) ×8 IMPLANT
ELECT BLADE 4.0 EZ CLEAN MEGAD (MISCELLANEOUS) ×4
ELECT REM PT RETURN 9FT ADLT (ELECTROSURGICAL) ×4
ELECTRODE BLDE 4.0 EZ CLN MEGD (MISCELLANEOUS) ×2 IMPLANT
ELECTRODE REM PT RTRN 9FT ADLT (ELECTROSURGICAL) ×2 IMPLANT
FORCEPS BIOP RJ4 1.8 (CUTTING FORCEPS) IMPLANT
GEL ULTRASOUND 20GR AQUASONIC (MISCELLANEOUS) IMPLANT
GLOVE BIOGEL M STRL SZ7.5 (GLOVE) ×3 IMPLANT
GLOVE BIOGEL PI IND STRL 7.0 (GLOVE) ×8 IMPLANT
GLOVE BIOGEL PI INDICATOR 7.0 (GLOVE) ×8
GLOVE ORTHO TXT STRL SZ7.5 (GLOVE) ×8 IMPLANT
GLOVE SURG SIGNA 7.5 PF LTX (GLOVE) IMPLANT
GOWN STRL REUS W/ TWL LRG LVL3 (GOWN DISPOSABLE) ×8 IMPLANT
GOWN STRL REUS W/TWL LRG LVL3 (GOWN DISPOSABLE) ×16
HANDLE STAPLE ENDO GIA SHORT (STAPLE) ×2
KIT BASIN OR (CUSTOM PROCEDURE TRAY) ×4 IMPLANT
KIT ROOM TURNOVER OR (KITS) ×4 IMPLANT
KIT SUCTION CATH 14FR (SUCTIONS) ×4 IMPLANT
MARKER SKIN DUAL TIP RULER LAB (MISCELLANEOUS) ×4 IMPLANT
NDL BIOPSY TRANSBRONCH 21G (NEEDLE) IMPLANT
NDL BLUNT 18X1 FOR OR ONLY (NEEDLE) IMPLANT
NEEDLE BIOPSY TRANSBRONCH 21G (NEEDLE) IMPLANT
NEEDLE BLUNT 18X1 FOR OR ONLY (NEEDLE) IMPLANT
NEEDLE HYPO 22GX1.5 SAFETY (NEEDLE) IMPLANT
NS IRRIG 1000ML POUR BTL (IV SOLUTION) ×8 IMPLANT
OIL SILICONE PENTAX (PARTS (SERVICE/REPAIRS)) ×4 IMPLANT
PACK CHEST (CUSTOM PROCEDURE TRAY) ×4 IMPLANT
PAD ARMBOARD 7.5X6 YLW CONV (MISCELLANEOUS) ×8 IMPLANT
PAIN PUMP ON-Q 400MLX5ML 5IN (MISCELLANEOUS) ×4 IMPLANT
RELOAD EGIA 45 MED/THCK PURPLE (STAPLE) ×3 IMPLANT
RELOAD EGIA 60 MED/THCK PURPLE (STAPLE) ×4 IMPLANT
SEALANT SURG COSEAL 4ML (VASCULAR PRODUCTS) ×4 IMPLANT
SEALANT SURG COSEAL 8ML (VASCULAR PRODUCTS) ×3 IMPLANT
SOLUTION ANTI FOG 6CC (MISCELLANEOUS) ×4 IMPLANT
SPECIMEN JAR MEDIUM (MISCELLANEOUS) ×4 IMPLANT
SPONGE GAUZE 4X4 12PLY (GAUZE/BANDAGES/DRESSINGS) ×4 IMPLANT
SPONGE GAUZE 4X4 12PLY STER LF (GAUZE/BANDAGES/DRESSINGS) ×3 IMPLANT
SPONGE TONSIL 1.25 RF SGL STRG (GAUZE/BANDAGES/DRESSINGS) ×4 IMPLANT
STAPLER ENDO GIA 12 SHRT THIN (STAPLE) IMPLANT
STAPLER ENDO GIA 12MM SHORT (STAPLE) ×2 IMPLANT
STAPLER VISISTAT 35W (STAPLE) IMPLANT
SUT CHROMIC 3 0 SH 27 (SUTURE) ×24 IMPLANT
SUT ETHILON 3 0 PS 1 (SUTURE) IMPLANT
SUT PROLENE 3 0 SH DA (SUTURE) IMPLANT
SUT PROLENE 4 0 RB 1 (SUTURE)
SUT PROLENE 4 0 SH DA (SUTURE) ×4 IMPLANT
SUT PROLENE 4-0 RB1 .5 CRCL 36 (SUTURE) IMPLANT
SUT PROLENE 6 0 C 1 30 (SUTURE) IMPLANT
SUT SILK  1 MH (SUTURE) ×8
SUT SILK 1 MH (SUTURE) ×8 IMPLANT
SUT SILK 2 0SH CR/8 30 (SUTURE) IMPLANT
SUT SILK 3 0 SH CR/8 (SUTURE) ×3 IMPLANT
SUT SILK 3 0SH CR/8 30 (SUTURE) IMPLANT
SUT VIC AB 1 CTX 18 (SUTURE) ×6 IMPLANT
SUT VIC AB 2 TP1 27 (SUTURE) IMPLANT
SUT VIC AB 2-0 CT1 27 (SUTURE)
SUT VIC AB 2-0 CT1 TAPERPNT 27 (SUTURE) IMPLANT
SUT VIC AB 2-0 CT2 18 VCP726D (SUTURE) IMPLANT
SUT VIC AB 2-0 CTX 27 (SUTURE) ×3 IMPLANT
SUT VIC AB 2-0 CTX 36 (SUTURE) IMPLANT
SUT VIC AB 3-0 MH 27 (SUTURE) IMPLANT
SUT VIC AB 3-0 SH 18 (SUTURE) IMPLANT
SUT VIC AB 3-0 SH 27 (SUTURE)
SUT VIC AB 3-0 SH 27X BRD (SUTURE) IMPLANT
SUT VIC AB 3-0 X1 27 (SUTURE) ×3 IMPLANT
SUT VICRYL 0 UR6 27IN ABS (SUTURE) ×4 IMPLANT
SUT VICRYL 2 TP 1 (SUTURE) ×3 IMPLANT
SUT VICRYL 4-0 PS2 18IN ABS (SUTURE) IMPLANT
SWAB COLLECTION DEVICE MRSA (MISCELLANEOUS) IMPLANT
SYR 20ML ECCENTRIC (SYRINGE) ×4 IMPLANT
SYR 5ML LUER SLIP (SYRINGE) ×4 IMPLANT
SYR CONTROL 10ML LL (SYRINGE) IMPLANT
SYSTEM SAHARA CHEST DRAIN ATS (WOUND CARE) ×4 IMPLANT
TAPE CLOTH 4X10 WHT NS (GAUZE/BANDAGES/DRESSINGS) ×4 IMPLANT
TAPE CLOTH SURG 4X10 WHT LF (GAUZE/BANDAGES/DRESSINGS) ×4 IMPLANT
TOWEL OR 17X24 6PK STRL BLUE (TOWEL DISPOSABLE) ×4 IMPLANT
TOWEL OR 17X26 10 PK STRL BLUE (TOWEL DISPOSABLE) ×4 IMPLANT
TRAP SPECIMEN MUCOUS 40CC (MISCELLANEOUS) ×7 IMPLANT
TRAY FOLEY CATH 16FRSI W/METER (SET/KITS/TRAYS/PACK) ×4 IMPLANT
TUBE ANAEROBIC SPECIMEN COL (MISCELLANEOUS) IMPLANT
TUBE CONNECTING 12'X1/4 (SUCTIONS) ×2
TUBE CONNECTING 12X1/4 (SUCTIONS) ×6 IMPLANT
WATER STERILE IRR 1000ML POUR (IV SOLUTION) ×8 IMPLANT

## 2013-06-11 NOTE — Progress Notes (Signed)
Pt in OR for 0800 vent check.  Left 67M at 0728 and returned at 1125.

## 2013-06-11 NOTE — Transfer of Care (Signed)
Immediate Anesthesia Transfer of Care Note  [name]  Procedure(s) Performed: Procedure(s): VIDEO BRONCHOSCOPY (N/A) VIDEO ASSISTED THORACOSCOPY (VATS)/THOROCOTOMY (Left)  Patient Location: ICU  Anesthesia Type:General  Level of Consciousness: Patient remains intubated per anesthesia plan  Airway & Oxygen Therapy: Patient remains intubated per anesthesia plan  Post-op Assessment: Report given to PACU RN and Post -op Vital signs reviewed and stable  Post vital signs: Reviewed  Complications: No apparent anesthesia complications

## 2013-06-11 NOTE — Brief Op Note (Addendum)
      [address]       [place] [address]             [phone]     06/04/2013 - 06/11/2013  10:51 AM  PATIENT:  James [name]  53 y.o. male  PRE-OPERATIVE DIAGNOSIS: LEFT HEMOTHORAX, MULTIPLE RIB FRACTURES  POST-OPERATIVE DIAGNOSIS:  SAME  PROCEDURE:  Procedure(s): VIDEO BRONCHOSCOPY VIDEO ASSISTED THORACOSCOPY (VATS)/THOROCOTOMY, DRAINAGE OF HEMOTHORAX, DECORTICATION,  REPAIR OF LUNG LACERATION, RESECTION LLL WEDGE PLACEMENT ON-Q DEVICE  SURGEON:  Surgeon(s): Ivin Poot, MD  PHYSICIAN ASSISTANT: WAYNE GOLD PA-C  ANESTHESIA:   general  SPECIMEN:  Source of Specimen:  LLL LUNG WEDGE, CULTURES  DISPOSITION OF SPECIMEN:  Pathology  DRAINS: 3 Chest Tube(s) in the LEFT HEMITHORAX   PATIENT CONDITION:  PACU - hemodynamically stable.  PRE-OPERATIVE WEIGHT: 41PF  COMPLICATIONS: NO KNOWN  Rib fractures minimally displaced Loculated hemothorax completely drained Contusion of left lower lobe found at surgery with visceral pleural abrasions No diaphragmatic injury noted

## 2013-06-11 NOTE — Anesthesia Procedure Notes (Addendum)
Procedure Name: Intubation Date/Time: 06/11/2013 8:40 AM Performed by: Matina Rodier, Virgel Gess Pre-anesthesia Checklist: Patient identified, Emergency Drugs available, Suction available, Patient being monitored and Timeout performed Patient Re-evaluated:Patient Re-evaluated prior to inductionOxygen Delivery Method: Circle system utilized Preoxygenation: Pre-oxygenation with 100% oxygen Intubation Type: Inhalational induction with existing ETT Laryngoscope Size: Mac and 3 Grade View: Grade I Tube type: Oral Endobronchial tube: Double lumen EBT and Left and 37 Fr Number of attempts: 1 Airway Equipment and Method: Stylet and Fiberoptic brochoscope Placement Confirmation: ETT inserted through vocal cords under direct vision,  positive ETCO2,  CO2 detector and breath sounds checked- equal and bilateral (placement confirmed with Fiberoptic scope) Tube secured with: Tape Dental Injury: Teeth and Oropharynx as per pre-operative assessment

## 2013-06-11 NOTE — Progress Notes (Signed)
UR completed.  Pearson Reasons, RN BSN MHA CCM Trauma/Neuro ICU Case Manager [phone]  

## 2013-06-11 NOTE — OR Nursing (Signed)
[facility] notified of 30 minute arrival

## 2013-06-11 NOTE — Progress Notes (Signed)
OT Cancellation Note  Patient Details Name: James [name] MRN: [id number] DOB: Jan 14, 1960   Cancelled Treatment:     Pt in OR.  Almon Register [phone] 06/11/2013, 9:47 AM

## 2013-06-11 NOTE — Procedures (Deleted)
Arterial Catheter Insertion Procedure Note James [name] [id number] 1960-01-28  Procedure: Insertion of Arterial Catheter  Indications: Blood pressure monitoring  Procedure Details Consent: Unable to obtain consent because of altered level of consciousness. Time Out: Verified patient identification, verified procedure, site/side was marked, verified correct patient position, special equipment/implants available, medications/allergies/relevent history reviewed, required imaging and test results available.  Performed  Maximum sterile technique was used including antiseptics, cap, gloves, gown, hand hygiene, mask and sheet. Skin prep: Chlorhexidine; local anesthetic administered 20 gauge catheter was inserted into right radial artery using the Seldinger technique.  Evaluation Blood flow good; BP tracing good. Complications: No apparent complications.   [facility] 06/11/2013

## 2013-06-11 NOTE — Anesthesia Postprocedure Evaluation (Signed)
  Anesthesia Post-op Note  [name]  Procedure(s) Performed: Procedure(s): VIDEO BRONCHOSCOPY (N/A) VIDEO ASSISTED THORACOSCOPY (VATS)/THOROCOTOMY (Left)  Patient Location: NICU  Anesthesia Type:General  Level of Consciousness: sedated and Patient remains intubated per anesthesia plan  Airway and Oxygen Therapy: Patient remains intubated per anesthesia plan and Patient placed on Ventilator (see vital sign flow sheet for setting)  Post-op Pain: none  Post-op Assessment: Post-op Vital signs reviewed, Patient's Cardiovascular Status Stable, Respiratory Function Stable, Patent Airway and Pain level controlled  Post-op Vital Signs: stable  Last Vitals:  Filed Vitals:   06/11/13 1600  BP: 118/74  Pulse: 88  Temp:   Resp: 20    Complications: No apparent anesthesia complications

## 2013-06-11 NOTE — Anesthesia Preprocedure Evaluation (Signed)
Anesthesia Evaluation  Patient identified by MRN, date of birth, ID band  Reviewed: Allergy & Precautions, H&P , NPO status , Patient's Chart, lab work & pertinent test results  Airway       Dental   Pulmonary pneumonia -, Current Smoker,          Cardiovascular     Neuro/Psych    GI/Hepatic   Endo/Other    Renal/GU      Musculoskeletal   Abdominal   Peds  Hematology   Anesthesia Other Findings   Reproductive/Obstetrics                           Anesthesia Physical Anesthesia Plan  ASA: IV  Anesthesia Plan: General   Post-op Pain Management:    Induction: Intravenous  Airway Management Planned: Double Lumen EBT and Oral ETT  Additional Equipment: Arterial line, CVP and Ultrasound Guidance Line Placement  Intra-op Plan:   Post-operative Plan: Post-operative intubation/ventilation  Informed Consent: I have reviewed the patients History and Physical, chart, labs and discussed the procedure including the risks, benefits and alternatives for the proposed anesthesia with the patient or authorized representative who has indicated his/her understanding and acceptance.   Dental advisory given  Plan Discussed with: CRNA, Anesthesiologist and Surgeon  Anesthesia Plan Comments:         Anesthesia Quick Evaluation

## 2013-06-11 NOTE — Progress Notes (Signed)
Patient ID: James [name], male   DOB: 1960-10-30, 53 y.o.   MRN: [id number]  LOS: 7 days   Subjective: Back from surgery.  Febrile, WBC 12.2, on vanc/zosyn   Objective: Vital signs in last 24 hours: Temp:  [98.4 F (36.9 C)-101.1 F (38.4 C)] 98.4 F (36.9 C) (06/08 1200) Pulse Rate:  [73-93] 75 (06/08 1200) Resp:  [18-23] 22 (06/08 1200) BP: (114-139)/(67-89) 132/79 mmHg (06/08 1200) SpO2:  [94 %-100 %] 94 % (06/08 1202) Arterial Line BP: (159-163)/(74-80) 163/80 mmHg (06/08 1200) FiO2 (%):  [40 %] 40 % (06/08 1202) Weight:  [160 lb 11.5 oz (72.9 kg)] 160 lb 11.5 oz (72.9 kg) (06/08 0428) Last BM Date:  (PTA)  Lab Results:  CBC  Recent Labs  06/10/13 0515 06/11/13 1240  WBC 7.3 12.2*  HGB 9.8* 11.2*  HCT 30.1* 33.6*  PLT 200 226   BMET  Recent Labs  06/09/13 0440 06/10/13 0515  NA 137 141  K 4.4 4.3  CL 98 102  CO2 32 32  GLUCOSE 144* 154*  BUN 28* 28*  CREATININE 0.73 0.72  CALCIUM 8.6 8.6    Imaging: Ct Chest Wo Contrast  06/09/2013   CLINICAL DATA:  Respiratory failure. Left lung atelectasis. Left rib fractures and hemothorax. On ventilator.  EXAM: CT CHEST WITHOUT CONTRAST  TECHNIQUE: Multidetector CT imaging of the chest was performed following the standard protocol without IV contrast.  COMPARISON:  06/04/2013  FINDINGS: Multiple left-sided rib fractures again noted. Increased size of left pleural effusion demonstrated with some high attenuation areas likely representing hemothorax. There is also increased compressive atelectasis of left lower lobe.  New infiltrate is seen in the posterior right lower lobe. No evidence of right-sided pleural effusion. Mild pulmonary emphysema noted. No hilar or mediastinal masses are identified. Endotracheal tube and nasogastric tube are seen in place.  IMPRESSION: Increased moderate left hemothorax and compressive left lower lobe atelectasis.  Multiple left rib fractures again noted. No pneumothorax identified.  New  infiltrate or atelectasis in the posterior right lower lobe. Pneumonia cannot be excluded.  Mild emphysema.   Electronically Signed   By: Earle Gell M.D.   On: 06/09/2013 18:52   Dg Chest Portable 1 View  06/11/2013   CLINICAL DATA:  Post bronchoscopy.  EXAM: PORTABLE CHEST - 1 VIEW  COMPARISON:  06/11/2013 and CT chest 06/09/2013.  FINDINGS: Endotracheal tube terminates 2.8 cm above the carina. Nasogastric tube terminates in the stomach. Right subclavian central line tip projects over the SVC. Three left chest tubes are in place without a definite pneumothorax. There is airspace disease at the left lung base, slightly improved from 06/11/2013. Complete a near-complete resolution of previously seen left pleural effusion/hemothorax. Right lung is clear.  Acute left rib fractures with subcutaneous emphysema along the left chest wall. Old right rib fractures.  IMPRESSION: 1. No definite left pneumothorax with 3 chest tubes in place. 2. Improving left basilar aeration with interval decrease in left pleural fluid/hemothorax. 3. Multiple left rib fractures with subcutaneous emphysema along the left chest wall.   Electronically Signed   By: Lorin Picket M.D.   On: 06/11/2013 11:32   Dg Chest Port 1 View  06/11/2013   CLINICAL DATA:  Chest trauma and increasing left-sided hemothorax. The patient is scheduled for the VATS today.  EXAM: PORTABLE CHEST - 1 VIEW  COMPARISON:  06/10/2013 and CT of the chest on 06/09/2013  FINDINGS: Endotracheal tube remains with the tip approximately 2 cm above the carina.  Nasogastric tube extends below the diaphragm. Stable positioning of central line. Chest shows fairly stable complex left pleural effusion consistent with hemothorax by recent CT. Multiple displaced left-sided rib fractures are again noted. No visible pneumothorax. No pulmonary edema. The heart size and mediastinal contours are stable.  IMPRESSION: Relatively stable left hemothorax.  No pneumothorax identified.    Electronically Signed   By: Aletta Edouard M.D.   On: 06/11/2013 07:48   Dg Chest Port 1 View  06/10/2013   CLINICAL DATA:  Endotracheal tube placement.  EXAM: PORTABLE CHEST - 1 VIEW  COMPARISON:  06/09/2013 as well as chest CT 06/09/2013  FINDINGS: Endotracheal tube has tip 6.6 cm above the carina. Nasogastric tube has hypo over the stomach in the left upper quadrant as tip is not visualized. Subclavian central venous catheter is unchanged. Right lung is clear. There is persistent opacification over the left lung which is unchanged compatible patient's known moderate pleural effusion/hemothorax with associated atelectasis. Remainder the exam is unchanged including several left rib fractures.  IMPRESSION: Stable opacification over the left lung compatible with known effusion/hemothorax with associated atelectasis.  Known multiple left rib fractures.  Tubes and lines as described.   Electronically Signed   By: Marin Olp M.D.   On: 06/10/2013 08:58    PE:  General appearance: sedated on vent  Resp: CTA, vent.  Left CT with serosanguinous output.  Cardio: regular rate and rhythm, S1, S2 normal, no murmur, click, rub or gallop  GI: soft, non-tender; bowel sounds normal; no masses, no organomegaly  Extremities: extremities normal, atraumatic, no cyanosis or edema     Patient Active Problem List   Diagnosis Date Noted  . Hemothorax on left 06/11/2013  . Acute respiratory failure 06/08/2013  . Altered mental status 06/08/2013  . Pneumonia, organism unspecified 06/08/2013  . Fall from ladder 06/05/2013    Assessment/Plan:  Fall from ladder  Acute respiratory failure/Multiple left sided rib fracture/HPTX - bronchoscopy, intubated 6/4, suction PRN, nebs  -S/P VATS, drainage of hemothorax, decortication, repair of lung laceration, resection of LLL wedge---Dr. Prescott Gum 06/11/13 -Left CT -on vent, continue  -epidural per anesthesia -ON-G device per TCTS Left comminuted clavicle fracture-Dr.  Rolena Infante following. Figure 8 strap, outpatient follow up with Dr. Onnie Graham  VTE - SCD's, ?resume heparin ID - Vanc Zosyn empiric ABL anemia-check CBC in AM Hx opioid dependence  Dispo -- continue ICU i spoke with his friend at bedside  Erby Pian, ANP-BC Pager: [place] Pager: [phone]   06/11/2013 1:52 PM

## 2013-06-11 NOTE — Progress Notes (Signed)
PT Cancellation Note  Patient Details Name: James [name] MRN: [id number] DOB: July 02, 1960   Cancelled Treatment:    Reason Eval/Treat Not Completed: Medical issues which prohibited therapy (to the OR today)   [name] 06/11/2013, 7:54 AM [name], PT DPT  [phone]

## 2013-06-11 NOTE — Progress Notes (Signed)
Appreciate TCTS assistance and surgery. Will try to begin weaning tomorrow. On-Q plus epidural was reviewed by Dr. Ermalene Postin. Patient examined and I agree with the assessment and plan  Georganna Skeans, MD, MPH, FACS Trauma: [phone] General Surgery: [phone]  06/11/2013 2:57 PM

## 2013-06-11 NOTE — Progress Notes (Signed)
The patient was examined and preop studies reviewed. There has been no change from the prior exam and the patient is ready for surgery.  plan bronch, Left VATS and decortication on B Corella today

## 2013-06-12 ENCOUNTER — Inpatient Hospital Stay (HOSPITAL_COMMUNITY): Payer: Self-pay

## 2013-06-12 ENCOUNTER — Encounter (HOSPITAL_COMMUNITY): Payer: Self-pay | Admitting: Cardiothoracic Surgery

## 2013-06-12 LAB — BLOOD GAS, ARTERIAL
Acid-Base Excess: 3.3 mmol/L — ABNORMAL HIGH (ref 0.0–2.0)
Bicarbonate: 27.8 mEq/L — ABNORMAL HIGH (ref 20.0–24.0)
Drawn by: 331761
FIO2: 0.4 %
O2 Saturation: 98.3 %
PATIENT TEMPERATURE: 98.3
PEEP/CPAP: 5 cmH2O
PH ART: 7.396 (ref 7.350–7.450)
RATE: 20 resp/min
TCO2: 29.3 mmol/L (ref 0–100)
VT: 550 mL
pCO2 arterial: 46.2 mmHg — ABNORMAL HIGH (ref 35.0–45.0)
pO2, Arterial: 82.2 mmHg (ref 80.0–100.0)

## 2013-06-12 LAB — GLUCOSE, CAPILLARY
GLUCOSE-CAPILLARY: 108 mg/dL — AB (ref 70–99)
Glucose-Capillary: 122 mg/dL — ABNORMAL HIGH (ref 70–99)
Glucose-Capillary: 121 mg/dL — ABNORMAL HIGH (ref 70–99)
Glucose-Capillary: 133 mg/dL — ABNORMAL HIGH (ref 70–99)
Glucose-Capillary: 109 mg/dL — ABNORMAL HIGH (ref 70–99)

## 2013-06-12 LAB — CBC
HEMATOCRIT: 31.5 % — AB (ref 39.0–52.0)
Hemoglobin: 10.5 g/dL — ABNORMAL LOW (ref 13.0–17.0)
MCH: 32.5 pg (ref 26.0–34.0)
MCHC: 33.3 g/dL (ref 30.0–36.0)
MCV: 97.5 fL (ref 78.0–100.0)
Platelets: 251 10*3/uL (ref 150–400)
RBC: 3.23 MIL/uL — AB (ref 4.22–5.81)
RDW: 13.7 % (ref 11.5–15.5)
WBC: 10.9 10*3/uL — ABNORMAL HIGH (ref 4.0–10.5)

## 2013-06-12 LAB — BASIC METABOLIC PANEL
BUN: 20 mg/dL (ref 6–23)
CALCIUM: 8.6 mg/dL (ref 8.4–10.5)
CO2: 27 mEq/L (ref 19–32)
Chloride: 99 mEq/L (ref 96–112)
Creatinine, Ser: 0.61 mg/dL (ref 0.50–1.35)
GFR calc Af Amer: >90 mL/min (ref >90)
Glucose, Bld: 146 mg/dL — ABNORMAL HIGH (ref 70–99)
Potassium: 4.2 mEq/L (ref 3.7–5.3)
SODIUM: 137 meq/L (ref 137–147)

## 2013-06-12 MED ORDER — METOCLOPRAMIDE HCL 5 MG/ML IJ SOLN
10.0000 mg | Freq: Four times a day (QID) | INTRAMUSCULAR | Status: DC
  Administered 2013-06-12 – 2013-06-14 (×8): 10 mg via INTRAVENOUS
  Filled 2013-06-12 (×12): qty 2

## 2013-06-12 MED ORDER — POLYETHYLENE GLYCOL 3350 17 G PO PACK
17.0000 g | PACK | Freq: Every day | ORAL | Status: DC
  Administered 2013-06-12 – 2013-06-21 (×6): 17 g
  Filled 2013-06-12 (×11): qty 1

## 2013-06-12 MED ORDER — BISACODYL 10 MG RE SUPP
10.0000 mg | Freq: Every day | RECTAL | Status: DC | PRN
  Administered 2013-06-12: 10 mg via RECTAL
  Filled 2013-06-12: qty 1

## 2013-06-12 NOTE — Op Note (Signed)
NAMEJAMEN, LOISEAU NO.:  [id number]  MEDICAL RECORD NO.:  [id number]  LOCATION:  3M12C                        FACILITY:  [facility]  PHYSICIAN:  Ivin Poot, M.D.  DATE OF BIRTH:  1960/06/18  DATE OF PROCEDURE:  06/11/2013 DATE OF DISCHARGE:                              OPERATIVE REPORT   OPERATION: 1. Video bronchoscopy. 2. Left video-assisted thoracoscopic surgery, left mini thoracotomy     with drainage of hemothorax, and decortication of left lower lobe.  PREOPERATIVE DIAGNOSIS:  Left hemothorax following traumatic fall with atelectasis of left lower lobe.  POSTOPERATIVE DIAGNOSIS:  Left hemothorax following traumatic fall with atelectasis of left lower lobe.  SURGEON:  Ivin Poot, M.D.  ASSISTANT:  John Giovanni, PA-C.  ANESTHESIA:  General by Dr. Roberts Gaudy.  INDICATIONS:  The patient is 53 years old and fell off a 6-foot ladder on his back sustaining rib fractures and a left clavicle fracture.  He was admitted to the Trauma Service and developed a left pleural effusion, excessive airway secretions, and complete atelectasis of left lung.  He was intubated and bronchoscoped, which re-expanded the left lung, however, the pleural effusion progressed over several days and thoracic surgical evaluation was requested.  I reviewed the patient's CT scan showing the loculated left hemothorax and examined the patient in the room and discussed the situation with the patient's sister, Christianne Dolin.  The patient remained intubated and sedated at the time of evaluation and at the time of this surgery.  I felt that proceeding with left VATS with decortication of the hemothorax and re-expansion of the left lower lobe would be the best long-term therapy for his problem. The rib fractures appeared to be minimally displaced.  OPERATIVE PROCEDURE:  The patient was brought to the operating room and placed supine on the operating table where general  anesthesia was then induced.  A proper time-out was performed.  Through the endotracheal tube, a video bronchoscope was passed.  The distal carina and trachea were normal.  There were moderate thick secretions, which were white in both lungs.  The left mainstem bronchus was patent and had moderate amount of mucoid secretions.  The segments of the left upper lobe and left lower lobe were individually inspected and irrigated and all secretions were removed.  These were sent for culture.  Bronchoscope was then passed, brought down the right mainstem bronchus. There were no endobronchial lesions on either side.  The endobronchial segments of the right upper lobe, right middle lobe, and right lower lobe were all visualized.  Mild-to-moderate mucoid secretions were removed from the right lung as well and sent for culture.  The bronchoscope was then removed.  There was no evidence of trauma to the airway.  The patient was then reintubated with a double-lumen tube, turned left side up and repositioned.  The patient was prepped and draped as a sterile field.  A proper time-out was repeated.  A small incision was made from the tip of the scapula along the fifth interspace.  The pleural space was entered.  It was filled with dense adhesions, and there was poor visibility for the use of the videoscope. Incision was  extended to approximately 5 cm.  The ribs were gently spread, but not divided.  The pleural space was then dissected of moderate amount of clotted blood and serosanguineous pleural effusion. The left lung had some chronic dense adhesions to the chest wall, and these were taken down enough to expose all the loculated fluid and hemothorax, but the left lung was not completely mobilized for this procedure due to a benign etiology (nonmalignant).  The left lower lobe had contusion and a small segment was wedged with the Endo-GIA stapler. The left lower lobe was gently separated off the  diaphragm to expose any loculated pockets of blood and fluid.  Some adhesions were quite dense and chronic and these were left intact.  The upper lobe was adherent to the anterior chest wall, but the posterior gutter along the spinal canal was free and this was cleared of fluid and/or blood clots.  After evacuation of the clot in the material compressing the left lower lobe, the chest was irrigated with warm saline.  Three chest tubes were placed to drain the space above the diaphragm, the posterior paraspinal gutter, and the anterior pleural space brought out through separate incisions and secured to the skin.  The abrasions from the fall and from the dissection were covered with medical adhesive-Coseal.  Warm saline was then put in the pleural space and the lung was inflated and there was minimal air leak apparent.  The fluid was removed.  The ribs were reapproximated with 2 pericostal #2 Vicryls.  The muscle layers were closed with interrupted Vicryl.  An On-Q catheter was then placed between the main incision and the chest tube sites secured to the skin and connected to 0.5% Marcaine reservoir.  It was not felt that fixation of the rib fractures was indicated.  In order to expose the rib fractures, the dense adhesions of the entire upper lobe would have needed to be taken down and this would have resulted in more problems than the benefits of the rib plates would have provided.     Ivin Poot, M.D.     PV/MEDQ  D:  06/11/2013  T:  06/12/2013  Job:  [id number]

## 2013-06-12 NOTE — Progress Notes (Signed)
OT Cancellation Note  Patient Details Name: James [name] MRN: [id number] DOB: 1960-08-07   Cancelled Treatment:    Reason Eval/Treat Not Completed: Patient not medically ready (on vent. will assess when appriopriate)  [place], OTR/L  [phone] 06/12/2013 06/12/2013, 10:38 AM

## 2013-06-12 NOTE — Progress Notes (Signed)
Patient ID: James [name], male   DOB: Jul 11, 1960, 53 y.o.   MRN: [id number] Follow up - Trauma Critical Care  Patient Details:    James [name] is an 53 y.o. male.  Lines/tubes : Airway 7.5 mm (Active)  Secured at (cm) 24 cm 06/12/2013  8:07 AM  Measured From Lips 06/12/2013  8:07 AM  Secured Location Left 06/12/2013  8:07 AM  Secured By [employer] 06/12/2013  8:07 AM  Tube Holder Repositioned Yes 06/12/2013  8:07 AM  Cuff Pressure (cm H2O) 24 cm H2O 06/11/2013 11:26 PM  Site Condition Dry 06/12/2013  3:19 AM     CVC Triple Lumen 06/07/13 Right Internal jugular (Active)  Indication for Insertion or Continuance of Line Prolonged intravenous therapies 06/11/2013  8:00 PM  Site Assessment Clean;Dry;Intact 06/11/2013  8:00 PM  Proximal Lumen Status Infusing 06/11/2013  8:00 PM  Medial Infusing 06/11/2013  8:00 PM  Distal Lumen Status Saline locked 06/11/2013  8:00 PM  Dressing Type Transparent;Occlusive 06/11/2013  8:00 PM  Dressing Status Clean;Dry;Intact;Antimicrobial disc in place 06/11/2013  8:00 PM  Line Care Connections checked and tightened 06/11/2013  8:00 PM  Dressing Change Due 06/14/13 06/11/2013  8:00 PM     Arterial Line 06/11/13 Right Radial (Active)  Site Assessment Clean;Dry;Intact 06/11/2013  8:00 PM  Line Status Pulsatile blood flow 06/11/2013  8:00 PM  Art Line Waveform Appropriate 06/11/2013  8:00 PM  Art Line Interventions Zeroed and calibrated;Connections checked and tightened 06/11/2013  8:00 PM  Color/Movement/Sensation Capillary refill less than 3 sec 06/11/2013  8:00 PM  Dressing Type Transparent 06/11/2013  8:00 PM  Dressing Status Clean;Dry;Intact 06/11/2013  8:00 PM     NG/OG Tube Orogastric 16 Fr. Center mouth (Active)  Placement Verification Auscultation 06/11/2013  8:00 PM  Site Assessment Clean;Dry;Intact 06/11/2013  8:00 PM  Status Infusing tube feed 06/11/2013  8:00 PM  Drainage Appearance Bile 06/11/2013  8:00 PM  Gastric Residual 225 mL 06/12/2013  4:00 AM     Urethral Catheter  Candelaria Celeste, RN Gilford Rile RN Non-latex (Active)  Indication for Insertion or Continuance of Catheter Other (comment) 06/11/2013  8:00 PM  Site Assessment Clean;Intact 06/11/2013  8:00 PM  Catheter Maintenance Drainage bag/tubing not touching floor;Catheter secured;Bag below level of bladder;Insertion date on drainage bag;No dependent loops;Seal intact 06/11/2013  8:00 PM  Collection Container Standard drainage bag 06/11/2013  8:00 PM  Securement Method Leg strap 06/11/2013 12:00 PM  Urinary Catheter Interventions Unclamped 06/11/2013 12:00 PM  Output (mL) 75 mL 06/12/2013  6:00 AM     Y Chest Tube 1, 2, and 3 Left Pleural 28 Fr. Left Pleural 36 Fr. Left Pleural 36 Fr. (Active)  Suction -20 cm H2O 06/11/2013  8:00 PM  Chest Tube Air Leak Minimal 06/11/2013  8:00 PM  Patency Intervention Tip/tilt 06/11/2013  8:00 PM  Drainage Description 1 Bright red 06/11/2013  8:00 PM  Dressing Status 1 Old drainage 06/11/2013  8:00 PM  Dressing Intervention 1 Dressing reinforced 06/12/2013  4:00 AM  Site Assessment 1 Not assessed 06/11/2013  8:00 PM  Surrounding Skin 1 Unable to view 06/11/2013 12:00 PM  Drainage Description 2 Bright red 06/11/2013 12:00 PM  Dressing Status 2 Clean;Dry;Intact 06/11/2013 12:00 PM  Site Assessment 2 Clean;Dry;Intact 06/11/2013 12:00 PM  Surrounding Skin 2 Unable to view 06/11/2013 12:00 PM  Drainage Description 3 Bright red 06/11/2013 12:00 PM  Dressing Status 3 Clean;Dry;Intact 06/11/2013 12:00 PM  Site Assessment 3 Clean;Dry;Intact 06/11/2013 12:00 PM  Surrounding Skin  3 Unable to view 06/11/2013 12:00 PM  Output (mL) 160 mL 06/12/2013  6:00 AM    Microbiology/Sepsis markers: Results for orders placed during the hospital encounter of 06/04/13  MRSA PCR SCREENING     Status: None   Collection Time    06/05/13  1:08 AM      Result Value Ref Range Status   MRSA by PCR NEGATIVE  NEGATIVE Final   Comment:            The GeneXpert MRSA Assay (FDA     approved for NASAL specimens     only), is one component of  a     comprehensive MRSA colonization     surveillance program. It is not     intended to diagnose MRSA     infection nor to guide or     monitor treatment for     MRSA infections.  CULTURE, RESPIRATORY (NON-EXPECTORATED)     Status: None   Collection Time    06/06/13  6:21 PM      Result Value Ref Range Status   Specimen Description TRACHEAL ASPIRATE   Final   Special Requests NONE   Final   Gram Stain     Final   Value: RARE WBC PRESENT,BOTH PMN AND MONONUCLEAR     FEW SQUAMOUS EPITHELIAL CELLS PRESENT     ABUNDANT GRAM POSITIVE RODS     FEW GRAM POSITIVE COCCI IN PAIRS     RARE GRAM NEGATIVE RODS   Culture     Final   Value: Non-Pathogenic Oropharyngeal-type Flora Isolated.     Performed at [facility]   Report Status 06/08/2013 FINAL   Final  CULTURE, BAL-QUANTITATIVE     Status: None   Collection Time    06/07/13 10:05 AM      Result Value Ref Range Status   Specimen Description BRONCHIAL ALVEOLAR LAVAGE   Final   Special Requests NONE   Final   Gram Stain     Final   Value: ABUNDANT WBC PRESENT,BOTH PMN AND MONONUCLEAR     NO SQUAMOUS EPITHELIAL CELLS SEEN     NO ORGANISMS SEEN     Performed at [facility]     Final   Value: 5,000 COLONIES/ML     Performed at [facility]   Culture     Final   Value: Non-Pathogenic Oropharyngeal-type Flora Isolated.     Performed at [facility]   Report Status 06/09/2013 FINAL   Final  BODY FLUID CULTURE     Status: None   Collection Time    06/11/13  9:22 AM      Result Value Ref Range Status   Specimen Description PLEURAL FLUID LEFT   Final   Special Requests PT ON ZOSYN   Final   Gram Stain     Final   Value: FEW WBC PRESENT,BOTH PMN AND MONONUCLEAR     NO ORGANISMS SEEN     Performed at [facility]   Culture     Final   Value: NO GROWTH 1 DAY     Performed at [facility]   Report Status PENDING   Incomplete    Anti-infectives:  Anti-infectives    Start     Dose/Rate Route Frequency Ordered Stop   06/10/13 2200  vancomycin (VANCOCIN) IVPB 750 mg/150 ml premix     750 mg 150 mL/hr over 60 Minutes Intravenous Every 8 hours 06/10/13 1421  06/09/13 1330  vancomycin (VANCOCIN) IVPB 750 mg/150 ml premix  Status:  Discontinued     750 mg 150 mL/hr over 60 Minutes Intravenous Every 8 hours 06/09/13 1314 06/10/13 1311   06/07/13 1000  vancomycin (VANCOCIN) IVPB 750 mg/150 ml premix  Status:  Discontinued     750 mg 150 mL/hr over 60 Minutes Intravenous Every 12 hours 06/06/13 2100 06/09/13 1314   06/06/13 2200  piperacillin-tazobactam (ZOSYN) IVPB 3.375 g     3.375 g 12.5 mL/hr over 240 Minutes Intravenous Every 8 hours 06/06/13 2100     06/06/13 2115  vancomycin (VANCOCIN) IVPB 1000 mg/200 mL premix     1,000 mg 200 mL/hr over 60 Minutes Intravenous  Once 06/06/13 2100 06/06/13 2256      Best Practice/Protocols:  VTE Prophylaxis: Mechanical Continous Sedation  Consults: Treatment Team:  Melina Schools, MD Laurie Panda, MD Anesthesia Md, MD Ivin Poot, MD    Studies:CXR - Rather minimal pneumothorax remaining on the left laterally. There  are 3 chest tubes in place. There does remain effusion and  consolidation in the left base. Multiple left rib fractures are  again noted. Right lung is clear. Other tubes and catheters as  described.  Subjective:    Overnight Issues:  TF residuals high Objective:  Vital signs for last 24 hours: Temp:  [98.2 F (36.8 C)-100 F (37.8 C)] 98.3 F (36.8 C) (06/09 0313) Pulse Rate:  [75-90] 85 (06/09 0807) Resp:  [15-22] 19 (06/09 0807) BP: (99-136)/(54-88) 115/57 mmHg (06/09 0807) SpO2:  [94 %-100 %] 100 % (06/09 0807) Arterial Line BP: (92-163)/(54-80) 113/54 mmHg (06/09 0700) FiO2 (%):  [40 %] 40 % (06/09 0807) Weight:  [167 lb 1.7 oz (75.8 kg)] 167 lb 1.7 oz (75.8 kg) (06/09 0600)  Hemodynamic parameters for last 24 hours:    Intake/Output from previous day: 06/08 0701 -  06/09 0700 In: 6098.1 [I.V.:4569.1; Blood:325; NG/GT:354; IV Piggyback:850] Out: 2255 [Urine:1295; Blood:600; Chest Tube:360]  Intake/Output this shift:    Vent settings for last 24 hours: Vent Mode:  [-] CPAP;PSV FiO2 (%):  [40 %] 40 % Set Rate:  [20 bmp] 20 bmp Vt Set:  [550 mL] 550 mL PEEP:  [5 cmH20] 5 cmH20 Pressure Support:  [5 cmH20] 5 cmH20 Plateau Pressure:  [17 cmH20-23 cmH20] 21 cmH20  Physical Exam:  General: on vent Neuro: arouses and F/C HEENT/Neck: ETT Resp: clear to auscultation bilaterally CVS: RRR GI: soft, scant BS, NT, min dist Extremities: calves soft  Results for orders placed during the hospital encounter of 06/04/13 (from the past 24 hour(s))  BODY FLUID CULTURE     Status: None   Collection Time    06/11/13  9:22 AM      Result Value Ref Range   Specimen Description PLEURAL FLUID LEFT     Special Requests PT ON ZOSYN     Gram Stain       Value: FEW WBC PRESENT,BOTH PMN AND MONONUCLEAR     NO ORGANISMS SEEN     Performed at [facility]   Culture       Value: NO GROWTH 1 DAY     Performed at [facility]   Report Status PENDING    GLUCOSE, CAPILLARY     Status: Abnormal   Collection Time    06/11/13 11:33 AM      Result Value Ref Range   Glucose-Capillary 119 (*) 70 - 99 mg/dL  BLOOD GAS, ARTERIAL     Status:  Abnormal   Collection Time    06/11/13 12:30 PM      Result Value Ref Range   FIO2 0.40     Delivery systems VENTILATOR     Mode PRESSURE REGULATED VOLUME CONTROL     VT 0.550     Rate 20.0     Peep/cpap 5.0     pH, Arterial 7.380  7.350 - 7.450   pCO2 arterial 48.1 (*) 35.0 - 45.0 mmHg   pO2, Arterial 69.5 (*) 80.0 - 100.0 mmHg   Bicarbonate 27.8 (*) 20.0 - 24.0 mEq/L   TCO2 29.3  0 - 100 mmol/L   Acid-Base Excess 3.1 (*) 0.0 - 2.0 mmol/L   O2 Saturation 95.9     Patient temperature 98.6     Collection site A-LINE     Drawn by [id number]     Sample type ARTERIAL DRAW    CBC     Status: Abnormal   Collection  Time    06/11/13 12:40 PM      Result Value Ref Range   WBC 12.2 (*) 4.0 - 10.5 K/uL   RBC 3.40 (*) 4.22 - 5.81 MIL/uL   Hemoglobin 11.2 (*) 13.0 - 17.0 g/dL   HCT 33.6 (*) 39.0 - 52.0 %   MCV 98.8  78.0 - 100.0 fL   MCH 32.9  26.0 - 34.0 pg   MCHC 33.3  30.0 - 36.0 g/dL   RDW 14.1  11.5 - 15.5 %   Platelets 226  150 - 400 K/uL  GLUCOSE, CAPILLARY     Status: Abnormal   Collection Time    06/11/13  3:46 PM      Result Value Ref Range   Glucose-Capillary 122 (*) 70 - 99 mg/dL   Comment 1 Notify RN     Comment 2 Documented in Chart    GLUCOSE, CAPILLARY     Status: Abnormal   Collection Time    06/11/13  7:55 PM      Result Value Ref Range   Glucose-Capillary 105 (*) 70 - 99 mg/dL   Comment 1 Notify RN     Comment 2 Documented in Chart    GLUCOSE, CAPILLARY     Status: Abnormal   Collection Time    06/11/13 11:23 PM      Result Value Ref Range   Glucose-Capillary 131 (*) 70 - 99 mg/dL   Comment 1 Documented in Chart     Comment 2 Notify RN    GLUCOSE, CAPILLARY     Status: Abnormal   Collection Time    06/12/13  3:12 AM      Result Value Ref Range   Glucose-Capillary 122 (*) 70 - 99 mg/dL   Comment 1 Documented in Chart     Comment 2 Notify RN    BLOOD GAS, ARTERIAL     Status: Abnormal   Collection Time    06/12/13  4:04 AM      Result Value Ref Range   FIO2 0.40     Delivery systems VENTILATOR     Mode PRESSURE REGULATED VOLUME CONTROL     VT 550     Rate 20.0     Peep/cpap 5.0     pH, Arterial 7.396  7.350 - 7.450   pCO2 arterial 46.2 (*) 35.0 - 45.0 mmHg   pO2, Arterial 82.2  80.0 - 100.0 mmHg   Bicarbonate 27.8 (*) 20.0 - 24.0 mEq/L   TCO2 29.3  0 - 100 mmol/L  Acid-Base Excess 3.3 (*) 0.0 - 2.0 mmol/L   O2 Saturation 98.3     Patient temperature 98.3     Collection site A-LINE     Drawn by [id number]     Sample type ARTERIAL DRAW    CBC     Status: Abnormal   Collection Time    06/12/13  4:30 AM      Result Value Ref Range   WBC 10.9 (*) 4.0 - 10.5  K/uL   RBC 3.23 (*) 4.22 - 5.81 MIL/uL   Hemoglobin 10.5 (*) 13.0 - 17.0 g/dL   HCT 31.5 (*) 39.0 - 52.0 %   MCV 97.5  78.0 - 100.0 fL   MCH 32.5  26.0 - 34.0 pg   MCHC 33.3  30.0 - 36.0 g/dL   RDW 13.7  11.5 - 15.5 %   Platelets 251  150 - 400 K/uL  BASIC METABOLIC PANEL     Status: Abnormal   Collection Time    06/12/13  4:30 AM      Result Value Ref Range   Sodium 137  137 - 147 mEq/L   Potassium 4.2  3.7 - 5.3 mEq/L   Chloride 99  96 - 112 mEq/L   CO2 27  19 - 32 mEq/L   Glucose, Bld 146 (*) 70 - 99 mg/dL   BUN 20  6 - 23 mg/dL   Creatinine, Ser 0.61  0.50 - 1.35 mg/dL   Calcium 8.6  8.4 - 10.5 mg/dL   GFR calc non Af Amer >90  >90 mL/min   GFR calc Af Amer >90  >90 mL/min  GLUCOSE, CAPILLARY     Status: Abnormal   Collection Time    06/12/13  8:12 AM      Result Value Ref Range   Glucose-Capillary 121 (*) 70 - 99 mg/dL    Assessment & Plan: Present on Admission:  . Fall from ladder . Hemothorax on left   LOS: 8 days   Additional comments:I reviewed the patient's new clinical lab test results. and CXR Fall from ladder  Acute respiratory failure/Multiple left sided rib fracture/HPTX -   -S/P VATS, drainage of hemothorax, decortication, repair of lung laceration, resection of LLL wedge---Dr. Prescott Gum 06/11/13 -Left CTs per TCTS -epidural removed 6/8 -ON-Q device per TCTS -wean toward extubation Left comminuted clavicle fracture-Dr. Rolena Infante following. Figure 8 strap, outpatient follow up with Dr. Onnie Graham  VTE - SCD's, resume Lovenox when OK with TCTS FEN - hold TF for 12h then try to resume, dulcolox + Miralax ID - Vanc Zosyn empiric, new CXs P from OR ABL anemia- down a bit post-op, F/U Hx opioid dependence  Dispo -- continue ICU Critical Care Total Time*: 34 Minutes  Georganna Skeans, MD, MPH, FACS Trauma: [phone] General Surgery: [phone]  06/12/2013  *Care during the described time interval was provided by me. I have reviewed this patient's available  data, including medical history, events of note, physical examination and test results as part of my evaluation.

## 2013-06-12 NOTE — Progress Notes (Signed)
PT Cancellation Note  Patient Details Name: James [name] MRN: [id number] DOB: 19-May-1960   Cancelled Treatment:    Reason Eval/Treat Not Completed: Medical issues which prohibited therapy Patient not medically ready (on vent. will assess when appriopriate)    [name] 06/12/2013, 1:11 PM [name], [place] DPT  [phone]

## 2013-06-12 NOTE — Progress Notes (Addendum)
TCTS DAILY ICU PROGRESS NOTE                   [address]            [place] [address]          [phone]   1 Day Post-Op Procedure(s) (LRB): VIDEO BRONCHOSCOPY (N/A) VIDEO ASSISTED THORACOSCOPY (VATS)/THOROCOTOMY (Left)  Total Length of Stay:  LOS: 8 days   Subjective: Alert on the vent, throat is sore  Objective: Vital signs in last 24 hours: Temp:  [98.2 F (36.8 C)-100 F (37.8 C)] 98.3 F (36.8 C) (06/09 0313) Pulse Rate:  [75-90] 85 (06/09 0807) Cardiac Rhythm:  [-] Normal sinus rhythm (06/08 2000) Resp:  [15-22] 19 (06/09 0807) BP: (99-136)/(54-88) 115/57 mmHg (06/09 0807) SpO2:  [94 %-100 %] 100 % (06/09 0807) Arterial Line BP: (92-163)/(54-80) 113/54 mmHg (06/09 0700) FiO2 (%):  [40 %] 40 % (06/09 0807) Weight:  [167 lb 1.7 oz (75.8 kg)] 167 lb 1.7 oz (75.8 kg) (06/09 0600)  Filed Weights   06/10/13 0440 06/11/13 0428 06/12/13 0600  Weight: 158 lb 8.2 oz (71.9 kg) 160 lb 11.5 oz (72.9 kg) 167 lb 1.7 oz (75.8 kg)   Vent Mode:  [-] CPAP;PSV FiO2 (%):  [40 %] 40 % Set Rate:  [20 bmp] 20 bmp Vt Set:  [550 mL] 550 mL PEEP:  [5 cmH20] 5 cmH20 Pressure Support:  [5 cmH20] 5 cmH20 Plateau Pressure:  [17 cmH20-23 cmH20] 21 cmH20 Weight change: 6 lb 6.3 oz (2.9 kg)   Hemodynamic parameters for last 24 hours:    Intake/Output from previous day: 06/08 0701 - 06/09 0700 In: 6098.1 [I.V.:4569.1; Blood:325; NG/GT:354; IV Piggyback:850] Out: 2255 [Urine:1295; Blood:600; Chest Tube:360]  Intake/Output this shift:    Current Meds: Scheduled Meds: . acetaminophen  1,000 mg Oral 4 times per day   Or  . acetaminophen (TYLENOL) oral liquid 160 mg/5 mL  1,000 mg Oral 4 times per day  . antiseptic oral rinse  15 mL Mouth Rinse QID  . chlorhexidine  15 mL Mouth Rinse BID  . folic acid  1 mg Intravenous Daily  . insulin aspart  0-15 Units Subcutaneous 6 times per day  . ipratropium-albuterol  3 mL Nebulization Q4H  . piperacillin-tazobactam  (ZOSYN)  IV  3.375 g Intravenous Q8H  . polyethylene glycol  17 g Per Tube Daily  . senna-docusate  1 tablet Oral QHS  . sodium chloride  3 mL Intravenous Q12H  . vancomycin  750 mg Intravenous Q8H   Continuous Infusions: . dexmedetomidine 0.8 mcg/kg/hr (06/12/13 0819)  . dextrose 5 % and 0.45% NaCl 100 mL/hr at 06/12/13 0600  . feeding supplement (PIVOT 1.5 CAL) 1,000 mL (06/12/13 0600)  . fentaNYL infusion INTRAVENOUS 200 mcg/hr (06/12/13 0600)  . ropivacaine (PF) 2 mg/ml (0.2%) Stopped (06/11/13 1030)   PRN Meds:.bisacodyl, diphenhydrAMINE, fentaNYL, HYDROmorphone (DILAUDID) injection, ipratropium-albuterol, ondansetron (ZOFRAN) IV, ondansetron (ZOFRAN) IV, ondansetron, potassium chloride, sodium chloride  General appearance: alert, cooperative and no distress Heart: regular rate and rhythm Lungs: diminished on left Abdomen: soft, non-tender, + BS Extremities: PAS in place, extrems warm and well perfused Wound: dressings intact  Lab Results: CBC: Recent Labs  06/11/13 1240 06/12/13 0430  WBC 12.2* 10.9*  HGB 11.2* 10.5*  HCT 33.6* 31.5*  PLT 226 251   BMET:  Recent Labs  06/10/13 0515 06/12/13 0430  NA 141 137  K 4.3 4.2  CL 102 99  CO2 32 27  GLUCOSE  154* 146*  BUN 28* 20  CREATININE 0.72 0.61  CALCIUM 8.6 8.6    PT/INR:  Recent Labs  06/10/13 1500  LABPROT 14.5  INR 1.15   ABG    Component Value Date/Time   PHART 7.396 06/12/2013 0404   PCO2ART 46.2* 06/12/2013 0404   PO2ART 82.2 06/12/2013 0404   HCO3 27.8* 06/12/2013 0404   TCO2 29.3 06/12/2013 0404   O2SAT 98.3 06/12/2013 0404      Radiology: Dg Chest Port 1 View  06/12/2013   CLINICAL DATA:  Hypoxia  EXAM: PORTABLE CHEST - 1 VIEW  COMPARISON:  June 11, 2013  FINDINGS: Endotracheal tube tip is 5.3 cm above the carina. Nasogastric tube tip and side port are in the stomach. Central catheter tip is in the superior cava. There are 3 chest tubes on the left. Only a a rather minimal left lateral pneumothorax  remains. There are multiple rib fractures on the left. There is left base consolidation with effusion. Right lung is clear. Heart size and pulmonary vascularity are normal. No apparent adenopathy.  IMPRESSION: Rather minimal pneumothorax remaining on the left laterally. There are 3 chest tubes in place. There does remain effusion and consolidation in the left base. Multiple left rib fractures are again noted. Right lung is clear. Other tubes and catheters as described.   Electronically Signed   By: Lowella Grip M.D.   On: 06/12/2013 07:25   Dg Chest Portable 1 View  06/11/2013   CLINICAL DATA:  Post bronchoscopy.  EXAM: PORTABLE CHEST - 1 VIEW  COMPARISON:  06/11/2013 and CT chest 06/09/2013.  FINDINGS: Endotracheal tube terminates 2.8 cm above the carina. Nasogastric tube terminates in the stomach. Right subclavian central line tip projects over the SVC. Three left chest tubes are in place without a definite pneumothorax. There is airspace disease at the left lung base, slightly improved from 06/11/2013. Complete a near-complete resolution of previously seen left pleural effusion/hemothorax. Right lung is clear.  Acute left rib fractures with subcutaneous emphysema along the left chest wall. Old right rib fractures.  IMPRESSION: 1. No definite left pneumothorax with 3 chest tubes in place. 2. Improving left basilar aeration with interval decrease in left pleural fluid/hemothorax. 3. Multiple left rib fractures with subcutaneous emphysema along the left chest wall.   Electronically Signed   By: Lorin Picket M.D.   On: 06/11/2013 11:32   Dg Chest Port 1 View  06/11/2013   CLINICAL DATA:  Chest trauma and increasing left-sided hemothorax. The patient is scheduled for the VATS today.  EXAM: PORTABLE CHEST - 1 VIEW  COMPARISON:  06/10/2013 and CT of the chest on 06/09/2013  FINDINGS: Endotracheal tube remains with the tip approximately 2 cm above the carina. Nasogastric tube extends below the diaphragm.  Stable positioning of central line. Chest shows fairly stable complex left pleural effusion consistent with hemothorax by recent CT. Multiple displaced left-sided rib fractures are again noted. No visible pneumothorax. No pulmonary edema. The heart size and mediastinal contours are stable.  IMPRESSION: Relatively stable left hemothorax.  No pneumothorax identified.   Electronically Signed   By: Aletta Edouard M.D.   On: 06/11/2013 07:48   Chest tube-  without air leak, 360 cc recorded     Assessment/Plan: S/P Procedure(s) (LRB): VIDEO BRONCHOSCOPY (N/A) VIDEO ASSISTED THORACOSCOPY (VATS)/THOROCOTOMY (Left)  1 stable 2 wean vent per Trauma- he is progressing well 3 poss d/c a chest tube 4 Hold on lovenox for now- poss restart in next day or so  5 Cont vanco and zosyn    John Giovanni 06/12/2013 9:07 AM Agree to hold heparin for 24 hours because of hemothorax  patient examined and medical record reviewed,agree with above note. Tharon Aquas Trigt 06/12/2013

## 2013-06-12 NOTE — Progress Notes (Signed)
ANTIBIOTIC CONSULT NOTE  Pharmacy Consult for vancomycin/zosyn Indication: HCAP  No Known Allergies  Patient Measurements: Height: 5\' 9"  (175.3 cm) Weight: 167 lb 1.7 oz (75.8 kg) IBW/kg (Calculated) : 70.7 Adjusted Body Weight:   Vital Signs: Temp: 99.1 F (37.3 C) (06/09 0800) Temp src: Axillary (06/09 0800) BP: 115/57 mmHg (06/09 0807) Pulse Rate: 85 (06/09 0807) Intake/Output from previous day: 06/08 0701 - 06/09 0700 In: 6098.1 [I.V.:4569.1; Blood:325; NG/GT:354; IV Piggyback:850] Out: 2255 [Urine:1295; Blood:600; Chest Tube:360] Intake/Output from this shift:    Labs:  Recent Labs  06/10/13 0515 06/11/13 1240 06/12/13 0430  WBC 7.3 12.2* 10.9*  HGB 9.8* 11.2* 10.5*  PLT 200 226 251  CREATININE 0.72  --  0.61   Estimated Creatinine Clearance: 108 ml/min (by C-G formula based on Cr of 0.61). No results found for this basename: VANCOTROUGH, VANCOPEAK, VANCORANDOM, GENTTROUGH, GENTPEAK, GENTRANDOM, TOBRATROUGH, TOBRAPEAK, TOBRARND, AMIKACINPEAK, AMIKACINTROU, AMIKACIN,  in the last 72 hours    Medical History: Past Medical History  Diagnosis Date  . Pneumothorax     Medications:  See EMR  Assessment: Day #7 vancomycin/zosyn for HCAP.  Vancomycin was dc'd then resumed for VATS on 6/8. Pt is afebrile, wbc wnl. Respiratory and BAL cultures 6/4 remain ngtd.  New cultures were obtained 6/8 - which will likely not grow anything since patient was on antibiotics several days prior.  Goal of Therapy:  Vancomycin trough level 15-20 mcg/ml  Plan:  -Vancomycin 750 mg IV q8h -Zosyn 3.375 g IV q8h -Recommend stopping antibiotics 6/10 to complete 8 day course -Also noted that patient has not had a bowel movement since before 06/04/13, recommend adding bowel regimen -F/u cultures, duration of therapy    Hughes Better, PharmD, BCPS Clinical Pharmacist Pager: [phone] 06/12/2013 10:14 AM

## 2013-06-13 ENCOUNTER — Inpatient Hospital Stay (HOSPITAL_COMMUNITY): Payer: Self-pay

## 2013-06-13 ENCOUNTER — Inpatient Hospital Stay (HOSPITAL_COMMUNITY): Payer: MEDICAID

## 2013-06-13 LAB — COMPREHENSIVE METABOLIC PANEL
ALT: 44 U/L (ref 0–53)
AST: 50 U/L — ABNORMAL HIGH (ref 0–37)
Albumin: 2.2 g/dL — ABNORMAL LOW (ref 3.5–5.2)
Alkaline Phosphatase: 41 U/L (ref 39–117)
BUN: 17 mg/dL (ref 6–23)
CALCIUM: 8.4 mg/dL (ref 8.4–10.5)
CO2: 28 meq/L (ref 19–32)
CREATININE: 0.53 mg/dL (ref 0.50–1.35)
Chloride: 103 mEq/L (ref 96–112)
GLUCOSE: 130 mg/dL — AB (ref 70–99)
Potassium: 4.3 mEq/L (ref 3.7–5.3)
Sodium: 140 mEq/L (ref 137–147)
TOTAL PROTEIN: 5.2 g/dL — AB (ref 6.0–8.3)
Total Bilirubin: 0.3 mg/dL (ref 0.3–1.2)

## 2013-06-13 LAB — GLUCOSE, CAPILLARY
GLUCOSE-CAPILLARY: 119 mg/dL — AB (ref 70–99)
GLUCOSE-CAPILLARY: 95 mg/dL (ref 70–99)
GLUCOSE-CAPILLARY: 135 mg/dL — AB (ref 70–99)
Glucose-Capillary: 107 mg/dL — ABNORMAL HIGH (ref 70–99)
Glucose-Capillary: 126 mg/dL — ABNORMAL HIGH (ref 70–99)

## 2013-06-13 LAB — CULTURE, RESPIRATORY

## 2013-06-13 LAB — TRIGLYCERIDES: TRIGLYCERIDES: 50 mg/dL (ref <150)

## 2013-06-13 LAB — CBC
HEMATOCRIT: 29.5 % — AB (ref 39.0–52.0)
HEMOGLOBIN: 9.9 g/dL — AB (ref 13.0–17.0)
MCH: 32.7 pg (ref 26.0–34.0)
MCHC: 33.6 g/dL (ref 30.0–36.0)
MCV: 97.4 fL (ref 78.0–100.0)
Platelets: 243 10*3/uL (ref 150–400)
RBC: 3.03 MIL/uL — AB (ref 4.22–5.81)
RDW: 13.2 % (ref 11.5–15.5)
WBC: 11.7 10*3/uL — ABNORMAL HIGH (ref 4.0–10.5)

## 2013-06-13 LAB — CULTURE, RESPIRATORY W GRAM STAIN: Culture: NO GROWTH

## 2013-06-13 MED ORDER — HYDROMORPHONE 0.3 MG/ML IV SOLN
INTRAVENOUS | Status: DC
  Administered 2013-06-13: 2.7 mg via INTRAVENOUS
  Administered 2013-06-13: 2.4 mg via INTRAVENOUS
  Administered 2013-06-13: 0.5 mg via INTRAVENOUS
  Administered 2013-06-14: 1.5 mg via INTRAVENOUS
  Administered 2013-06-14: 2.7 mg via INTRAVENOUS
  Administered 2013-06-14: 3 mg via INTRAVENOUS
  Administered 2013-06-14: 3.7 mg via INTRAVENOUS
  Administered 2013-06-14: 1.5 mg via INTRAVENOUS
  Administered 2013-06-14 (×2): via INTRAVENOUS
  Administered 2013-06-14: 4.2 mg via INTRAVENOUS
  Administered 2013-06-15: 1.8 mg via INTRAVENOUS
  Administered 2013-06-15: 07:00:00 via INTRAVENOUS
  Administered 2013-06-15: 3.3 mg via INTRAVENOUS
  Filled 2013-06-13 (×5): qty 25

## 2013-06-13 MED ORDER — ENOXAPARIN SODIUM 40 MG/0.4ML ~~LOC~~ SOLN
40.0000 mg | SUBCUTANEOUS | Status: DC
  Administered 2013-06-13: 40 mg via SUBCUTANEOUS
  Filled 2013-06-13 (×2): qty 0.4

## 2013-06-13 MED ORDER — SODIUM CHLORIDE 0.9 % IJ SOLN: 9.0000 mL | INTRAMUSCULAR | Status: DC | PRN

## 2013-06-13 MED ORDER — NALOXONE HCL 0.4 MG/ML IJ SOLN: 0.4000 mg | INTRAMUSCULAR | Status: DC | PRN

## 2013-06-13 MED ORDER — FUROSEMIDE 10 MG/ML IJ SOLN
20.0000 mg | Freq: Every day | INTRAMUSCULAR | Status: AC
  Administered 2013-06-13 – 2013-06-14 (×2): 20 mg via INTRAVENOUS
  Filled 2013-06-13 (×2): qty 2

## 2013-06-13 NOTE — Progress Notes (Signed)
Occupational Therapy Re-Evaluation Patient Details Name: James [name] MRN: [id number] DOB: 1960/10/31 Today's Date: 06/13/2013    History of Present Illness Patient is a 53 yo male suffered fall from ladder about 6 feet resulting in multiple rib fractures as well as left comminuted clavicle fracture, occult loculated pneumothorax and small left hemothorax. Pt with respiratory decline placed on ventilator support with intubation on 6/4, extubated 6/10.     Clinical Impression   Pt with significant functional decline given recent medical decline. Desat to 85 on 4L during transfer with RR @ 33. O2 increased to 5L then decreased to 4L and O2 rebounded to 98. Pt currently requires Max A with mobility and ADL. Pt will benefit from short CIR stay to maximize  level of independence with ADL and mobility and decrease burden of care. OT will follow to facilitate D/C to CIR.    Follow Up Recommendations  CIR;Supervision - Intermittent    Equipment Recommendations  3 in 1 bedside comode    Recommendations for Other Services Rehab consult     Precautions / Restrictions Precautions Precautions: Fall Precaution Comments: Pt with displaced Lt. clavicle fracture.  No orders for Lt. UE.  Per orho notes, sling Lt. UE and follow up with them as OP; chest tube (chest tube) Required Braces or Orthoses: Other Brace/Splint (LUE figure 8 brace) Restrictions Weight Bearing Restrictions: Yes LUE Weight Bearing: Non weight bearing      Mobility Bed Mobility Overal bed mobility: Needs Assistance;+2 for physical assistance Bed Mobility: Supine to Sit     Supine to sit: +2 for physical assistance;Mod assist     General bed mobility comments: Pt able to participate but need to use pad to help move hips to edge of bed  Transfers Overall transfer level: Needs assistance Equipment used: 1 person hand held assist (face to face ) Transfers: Sit to/from Stand;Stand Pivot Transfers Sit to Stand: Mod  assist Stand pivot transfers: Max assist       General transfer comment: Face to face, assist to elevate from bed, max assist to pivot to chair with heavy reliance on therapist support.     Balance Overall balance assessment: Needs assistance Sitting-balance support: Feet supported Sitting balance-Leahy Scale: Poor   Postural control: Posterior lean   Standing balance-Leahy Scale: Zero                              ADL Overall ADL's : Needs assistance/impaired     Grooming: Moderate assistance   Upper Body Bathing: Moderate assistance;Sitting   Lower Body Bathing: Maximal assistance;Sitting/lateral leans   Upper Body Dressing : Maximal assistance;Sitting   Lower Body Dressing: Total assistance;Sit to/from stand   Toilet Transfer: Maximal assistance;Stand-pivot   Toileting- Clothing Manipulation and Hygiene: Maximal assistance       Functional mobility during ADLs: Maximal assistance;+2 for safety/equipment General ADL Comments: Further decline in funciton since initial eval     Vision                     Perception     Praxis      Pertinent Vitals/Pain desat to 85 4 L initially with mobility. O2 increased to 5 L during mobility. Rebound to 98 4L C/o chest pain - using PCA     Hand Dominance Right   Extremity/Trunk Assessment Upper Extremity Assessment LUE Deficits / Details: Not tested due to immobilization; L clavicle fracture. waiting on clarification orders  regarding ROM and WB status   Lower Extremity Assessment Lower Extremity Assessment: Generalized weakness   Cervical / Trunk Assessment Cervical / Trunk Assessment: Normal (flail chest)   Communication Communication Communication: No difficulties (decreased volume of phonation post extubation)   Cognition Arousal/Alertness: Awake/alert Behavior During Therapy: WFL for tasks assessed/performed Overall Cognitive Status: Within Functional Limits for tasks assessed                      General Comments   chest tube left on suction    Exercises      Shoulder Instructions      Home Living Family/patient expects to be discharged to:: Private residence Living Arrangements: Alone Available Help at Discharge: Family;Friend(s) Type of Home: House Home Access: Stairs to enter [employer] of Steps: 2 Entrance Stairs-Rails: Right Home Layout: One level     Bathroom Shower/Tub: Tub/shower unit Shower/tub characteristics: Architectural technologist: Standard Bathroom Accessibility: Yes How Accessible: Accessible via walker Home Equipment: None   Additional Comments: Pt states he plans to stay with his sister at D/C      Prior Functioning/Environment Level of Independence: Independent        Comments: works as Scientist, research (medical)"    OT Diagnosis: Generalized weakness;Acute pain   OT Problem List: Decreased strength;Decreased range of motion;Decreased activity tolerance;Impaired balance (sitting and/or standing);Decreased safety awareness;Decreased knowledge of use of DME or AE;Cardiopulmonary status limiting activity;Pain;Impaired UE functional use   OT Treatment/Interventions: Self-care/ADL training;Therapeutic exercise;Energy conservation;DME and/or AE instruction;Therapeutic activities;Patient/family education;Balance training    OT Goals(Current goals can be found in the care plan section) Acute Rehab OT Goals Patient Stated Goal: to get stronger OT Goal Formulation: With patient Time For Goal Achievement: 06/27/13 Potential to Achieve Goals: Good  OT Frequency: Min 2X/week   Barriers to D/C:            Co-evaluation              End of Session Equipment Utilized During Treatment: Oxygen;Other (comment) Nurse Communication: Patient requests pain meds  Activity Tolerance: Patient limited by pain Patient left: in chair;with call bell/phone within reach   Time: 1234-1305 OT Time Calculation (min): 31 min Charges:  OT  General Charges $OT Visit: 1 Procedure OT Evaluation $OT Re-eval: 1 Procedure OT Treatments $Self Care/Home Management : 8-22 mins G-Codes:    Layliana Devins,HILLARY Jun 18, 2013, 1:50 PM   [facility], OTR/L  [phone] 2013/06/18

## 2013-06-13 NOTE — Progress Notes (Signed)
NUTRITION FOLLOW-UP    INTERVENTION: Supplement diet as appropriate  If unable to start PO diet, recommend: Vital AF 1.2 @ 25 ml/hr, increase by 10 ml every 4 hours to goal rate of 65 ml/hr (provides: 1872 kcal, 117 grams protein, and 1266 ml H2O.   NUTRITION DIAGNOSIS: Inadequate oral intake related to inability to eat as evidenced by NPO status.   Goal: Pt to meet >/=90% of estiamted nutrition needs   Monitor:  Diet advancement vs TF initiation/tolerance, respiratory status, weight trend, labs   ASSESSMENT: Pt is a 53 y.o male presenting with a fall off of a ladder. Pt found to have multiple rib fractures, left clavicle fracture and pneumothorax and small left hemothorax. Acute respiratory failure/Multiple left sided rib fracture/HPTX -  -S/P VATS, drainage of hemothorax, decortication, repair of lung laceration, resection of LLL wedge---Dr. Lucianne Lei Trigt 06/11/13   - Pt on regular diet from 6/2-6/3. Tolerated well, eating ~75%  Pt extubated this am. Remains NPO at this time.  Pt discussed during ICU rounds and with RN. Per RN pt was having high residuals 6/9 and TF was stopped and pt started on Reglan.   Height: Ht Readings from Last 1 Encounters:  06/07/13 5\' 9"  (1.753 m)    Weight: Wt Readings from Last 1 Encounters:  06/13/13 170 lb 6.7 oz (77.3 kg)  Admission weight 151 lb (68.5 kg) 6/2  BMI:  Body mass index is 25.15 kg/(m^2)., Normal   Estimated Nutritional Needs: Kcal: 1800-2000 Protein: 100-110 grams  Fluid: >/= 1.8 L/day   Skin: left chest incision    Diet Order:     Intake/Output Summary (Last 24 hours) at 06/13/13 1108 Last data filed at 06/13/13 0930  Gross per 24 hour  Intake 3469.47 ml  Output   1095 ml  Net 2374.47 ml    Last BM: 6/10  Labs:   Recent Labs Lab 06/08/13 0615 06/09/13 0440 06/10/13 0515 06/12/13 0430 06/13/13 0610  NA 138 137 141 137 140  K 5.0 4.4 4.3 4.2 4.3  CL 97 98 102 99 103  CO2 32 32 32 27 28  BUN 22 28* 28* 20  17  CREATININE 0.75 0.73 0.72 0.61 0.53  CALCIUM 9.1 8.6 8.6 8.6 8.4  MG  --  2.5  --   --   --   PHOS  --  3.8  --   --   --   GLUCOSE 145* 144* 154* 146* 130*    CBG (last 3)   Recent Labs  06/12/13 2354 06/13/13 0350 06/13/13 0818  GLUCAP 107* 119* 126*    Scheduled Meds: . acetaminophen  1,000 mg Oral 4 times per day   Or  . acetaminophen (TYLENOL) oral liquid 160 mg/5 mL  1,000 mg Oral 4 times per day  . antiseptic oral rinse  15 mL Mouth Rinse QID  . chlorhexidine  15 mL Mouth Rinse BID  . enoxaparin (LOVENOX) injection  40 mg Subcutaneous Q24H  . folic acid  1 mg Intravenous Daily  . furosemide  20 mg Intravenous Daily  . HYDROmorphone PCA 0.3 mg/mL   Intravenous 6 times per day  . insulin aspart  0-15 Units Subcutaneous 6 times per day  . ipratropium-albuterol  3 mL Nebulization Q4H  . metoCLOPramide (REGLAN) injection  10 mg Intravenous 4 times per day  . polyethylene glycol  17 g Per Tube Daily  . senna-docusate  1 tablet Oral QHS  . sodium chloride  3 mL Intravenous Q12H  Continuous Infusions: . dexmedetomidine Stopped (06/13/13 0930)  . dextrose 5 % and 0.45% NaCl 100 mL/hr at 06/12/13 2127  . fentaNYL infusion INTRAVENOUS 125 mcg/hr (06/13/13 0930)  . ropivacaine (PF) 2 mg/ml (0.2%) Stopped (06/11/13 1030)   Emmons, [place], CNSC [phone] Pager [phone] After Hours Pager

## 2013-06-13 NOTE — Evaluation (Signed)
Physical Therapy Evaluation Patient Details Name: James [name] MRN: [id number] DOB: [birth year] Today's Date: 06/13/2013   History of Present Illness  Patient is a 53 yo male suffered fall from ladder about 6 feet resulting in multiple rib fractures as well as left comminuted clavicle fracture, occult loculated pneumothorax and small left hemothorax. Pt with respiratory decline placed on ventilator support with intubation on 6/4, extubated 6/10.    Clinical Impression  Patient re-evaluated post extubation follow a wk of ventilator support. Patient demonstrates increased deficits in mobility and activity as indicated below. Patient will benefit from continued skilled PT to address deficits and maximzie function. Recommend CIR upon acute discharge.     Follow Up Recommendations CIR    Equipment Recommendations   (TBD)    Recommendations for Other Services Rehab consult     Precautions / Restrictions Precautions Precautions: Fall Precaution Comments: Pt with displaced Lt. clavicle fracture.  No orders for Lt. UE.  Per orho notes, sling Lt. UE and follow up with them as OP Required Braces or Orthoses: Sling (LUE figure 8 brace) Restrictions Weight Bearing Restrictions: Yes LUE Weight Bearing: Non weight bearing      Mobility  Bed Mobility Overal bed mobility: Needs Assistance Bed Mobility: Supine to Sit     Supine to sit: Min assist     General bed mobility comments: VCs for initiation of movement, increased time to perform, minimal assist to bring hips to EOB  Transfers Overall transfer level: Needs assistance Equipment used: 1 person hand held assist (face to face ) Transfers: Sit to/from [employer] Transfers Sit to Stand: Mod assist Stand pivot transfers: Max assist       General transfer comment: Face to face, assist to elevate from bed, max assist to pivot to chair with heavy reliance on therapist support.   Ambulation/Gait                Stairs             Wheelchair Mobility    Modified Rankin (Stroke Patients Only)       Balance Overall balance assessment: Needs assistance Sitting-balance support: Feet supported Sitting balance-Leahy Scale: Poor   Postural control: Posterior lean                                   Pertinent Vitals/Pain on 4 liters supplemental 02, desaturated to 86% with transfer, increased O2 to 5 liters and cued for breathing control returned to 96%, O2 lowered back to 4 liters, pain 8/10 left side (ribs)    Home Living Family/patient expects to be discharged to:: Private residence Living Arrangements: Alone Available Help at Discharge: Family;Friend(s) Type of Home: House Home Access: Stairs to enter Entrance Stairs-Rails: Right Entrance Stairs-Number of Steps: 2 Home Layout: One level Home Equipment: None Additional Comments: Pt states he may stay with sister post d/c    Prior Function Level of Independence: Independent               Hand Dominance   Dominant Hand: Right    Extremity/Trunk Assessment           LUE Deficits / Details: Not tested due to immobilization   Lower Extremity Assessment: Generalized weakness      Cervical / Trunk Assessment: Normal (flail chest)  Communication   Communication:  (decreased volume of phonation post extubation)  Cognition Arousal/Alertness: Awake/alert Behavior During Therapy: [facility] for tasks  assessed/performed Overall Cognitive Status: Within Functional Limits for tasks assessed                      General Comments General comments (skin integrity, edema, etc.): on 4 liters supplemental 02, desaturated to 86% with transfer, increased O2 to 5 liters and cued for breathing control returned to 96%, O2 lowered back to 4 liters    Exercises        Assessment/Plan    PT Assessment Patient needs continued PT services  PT Diagnosis Difficulty walking;Acute pain   PT Problem List Decreased range of  motion;Decreased activity tolerance;Decreased balance;Decreased mobility;Cardiopulmonary status limiting activity;Pain  PT Treatment Interventions DME instruction;Gait training;Stair training;Functional mobility training;Therapeutic activities;Therapeutic exercise;Balance training;Patient/family education   PT Goals (Current goals can be found in the Care Plan section) Acute Rehab PT Goals Patient Stated Goal: to go home PT Goal Formulation: With patient Time For Goal Achievement: 06/20/13 Potential to Achieve Goals: Good    Frequency Min 4X/week   Barriers to discharge       Co-evaluation               End of Session Equipment Utilized During Treatment: Gait belt;Other (comment) (sling LUE) Activity Tolerance: Patient limited by pain Patient left: in chair;with call bell/phone within reach;with nursing/sitter in room;with family/visitor present Nurse Communication: Mobility status         Time: 1236-1310 PT Time Calculation (min): 34 min   Charges:   PT Evaluation $PT Re-evaluation: 1 Procedure PT Treatments $Therapeutic Activity: 8-22 mins   PT G CodesDuncan [name] 06/13/2013, 1:26 PM James [name], [place] DPT  [phone]

## 2013-06-13 NOTE — Progress Notes (Signed)
Patient looks really good and should be able to be extubated.  Will order PCA after extubation for pain control.  This patient has been seen and I agree with the findings and treatment plan.  Kathryne Eriksson. Dahlia Bailiff, MD, [place] [phone] (pager) [phone] (direct pager) Trauma Surgeon

## 2013-06-13 NOTE — Progress Notes (Signed)
Pt in chair from 13:00-17:00. Tolerated well.

## 2013-06-13 NOTE — Progress Notes (Signed)
Wasted 70cc fentanyl from 2552mcg/250mL bag. Wasted Precedex  30cc from 50cc infusion. Leverne Humbles, RN as witness.

## 2013-06-13 NOTE — Progress Notes (Signed)
Patient ID: James [name], male   DOB: 1960-09-07, 53 y.o.   MRN: [id number]  LOS: 9 days   Subjective: Diaphoretic, no tachy, fevers, or increased RR.  Weaned off precedex, fentanyl  270mcg's.    Objective: Vital signs in last 24 hours: Temp:  [98.5 F (36.9 C)-99.7 F (37.6 C)] 98.5 F (36.9 C) (06/10 0800) Pulse Rate:  [51-102] 87 (06/10 0800) Resp:  [13-27] 19 (06/10 0800) BP: (93-138)/(56-87) 138/87 mmHg (06/10 0800) SpO2:  [89 %-100 %] 96 % (06/10 0800) Arterial Line BP: (107-156)/(55-81) 156/66 mmHg (06/10 0800) FiO2 (%):  [30 %-40 %] 40 % (06/10 0800) Weight:  [170 lb 6.7 oz (77.3 kg)] 170 lb 6.7 oz (77.3 kg) (06/10 0600) Last BM Date:  (prior to admission)  Lab Results:  CBC  Recent Labs  06/12/13 0430 06/13/13 0610  WBC 10.9* 11.7*  HGB 10.5* 9.9*  HCT 31.5* 29.5*  PLT 251 243   BMET  Recent Labs  06/12/13 0430 06/13/13 0610  NA 137 140  K 4.2 4.3  CL 99 103  CO2 27 28  GLUCOSE 146* 130*  BUN 20 17  CREATININE 0.61 0.53  CALCIUM 8.6 8.4    Imaging: Dg Chest Port 1 View  06/13/2013   CLINICAL DATA:  Prior thoracotomy.  EXAM: PORTABLE CHEST - 1 VIEW  COMPARISON:  Chest x-ray 06/12/2013.  FINDINGS: Endotracheal tube, NG tube, central line in stable position. Three left chest tubes in stable position. Previously identified left small pneumothorax no longer visualized. Basilar atelectatic changes are present particularly on the left. Stable left pleural effusion. Stable cardiomegaly, normal pulmonary vascularity. Multiple bilateral rib fractures again noted. Left clavicular fracture is present.  IMPRESSION: 1. Stable line and tube positions. 2. Three left chest tubes in stable position. Previously identified small pneumothorax no longer visualized. 3. Persistent bibasilar atelectasis particularly on the left. Small stable left pleural effusion. 4. Multiple bilateral rib fractures and left clavicular fracture again noted.   Electronically Signed   By: Marcello Moores   Register   On: 06/13/2013 07:36   Dg Chest Port 1 View  06/12/2013   CLINICAL DATA:  Hypoxia  EXAM: PORTABLE CHEST - 1 VIEW  COMPARISON:  June 11, 2013  FINDINGS: Endotracheal tube tip is 5.3 cm above the carina. Nasogastric tube tip and side port are in the stomach. Central catheter tip is in the superior cava. There are 3 chest tubes on the left. Only a a rather minimal left lateral pneumothorax remains. There are multiple rib fractures on the left. There is left base consolidation with effusion. Right lung is clear. Heart size and pulmonary vascularity are normal. No apparent adenopathy.  IMPRESSION: Rather minimal pneumothorax remaining on the left laterally. There are 3 chest tubes in place. There does remain effusion and consolidation in the left base. Multiple left rib fractures are again noted. Right lung is clear. Other tubes and catheters as described.   Electronically Signed   By: Lowella Grip M.D.   On: 06/12/2013 07:25   Dg Chest Portable 1 View  06/11/2013   CLINICAL DATA:  Post bronchoscopy.  EXAM: PORTABLE CHEST - 1 VIEW  COMPARISON:  06/11/2013 and CT chest 06/09/2013.  FINDINGS: Endotracheal tube terminates 2.8 cm above the carina. Nasogastric tube terminates in the stomach. Right subclavian central line tip projects over the SVC. Three left chest tubes are in place without a definite pneumothorax. There is airspace disease at the left lung base, slightly improved from 06/11/2013. Complete a near-complete resolution of  previously seen left pleural effusion/hemothorax. Right lung is clear.  Acute left rib fractures with subcutaneous emphysema along the left chest wall. Old right rib fractures.  IMPRESSION: 1. No definite left pneumothorax with 3 chest tubes in place. 2. Improving left basilar aeration with interval decrease in left pleural fluid/hemothorax. 3. Multiple left rib fractures with subcutaneous emphysema along the left chest wall.   Electronically Signed   By: Lorin Picket M.D.    On: 06/11/2013 11:32    PE:  General appearance: awake on vent  Resp: CTA, vent. Left CTs with serosanguinous output.  Cardio: regular rate and rhythm, S1, S2 normal, no murmur, click, rub or gallop  GI: soft, non-tender; bowel sounds normal; no masses, no organomegaly  Extremities: extremities normal, atraumatic, no cyanosis or edema     Patient Active Problem List   Diagnosis Date Noted  . Hemothorax on left 06/11/2013  . Acute respiratory failure 06/08/2013  . Altered mental status 06/08/2013  . Pneumonia, organism unspecified 06/08/2013  . Fall from ladder 06/05/2013    Assessment/Plan: Fall from ladder  Acute respiratory failure/Multiple left sided rib fracture/HPTX -  -S/P VATS, drainage of hemothorax, decortication, repair of lung laceration, resection of LLL wedge---Dr. Prescott Gum 06/11/13  -Left CTs per TCTS  -epidural removed 6/8  -ON-Q device per TCTS  -plan to extubate today  Left comminuted clavicle fracture-Dr. Rolena Infante following. Figure 8 strap, outpatient follow up with Dr. Onnie Graham  VTE - SCD's, resume Lovenox  FEN - resume TF if unable to extubate ID - Vanc Zosyn empiric D8--stop, new cultures 6/8 negative to date ABL anemia- down a bit post-op, F/U  Hx opioid dependence  Dispo -- continue ICU   Erby Pian, ANP-BC Pager: [phone] General Trauma PA Pager: [phone]   06/13/2013 9:32 AM

## 2013-06-13 NOTE — Procedures (Signed)
**Note De-Identified Johathan Province Obfuscation** Extubation Procedure Note  Patient Details:   Name: ARLAN BIRKS DOB: 08-Feb-1960 MRN: [id number]   Airway Documentation:     Evaluation  O2 sats: stable throughout Complications: No apparent complications Patient did tolerate procedure well. Bilateral Breath Sounds: Clear;Diminished Suctioning: Airway Yes +leak, no stridor noted James [name], James [name] 06/13/2013, 10:26 AM

## 2013-06-13 NOTE — Progress Notes (Addendum)
[address]       [place] [address]             [phone]      James [name] is a 53 y.o. male patient.  1. Multiple rib fractures   2. Fall   3. Acute respiratory failure   4. Altered mental status   5. Pneumonia, organism unspecified   6. Pleural effusion   7. ETOH abuse    Past Medical History  Diagnosis Date  . Pneumothorax    No past surgical history pertinent negatives on file. Scheduled Meds: . acetaminophen  1,000 mg Oral 4 times per day   Or  . acetaminophen (TYLENOL) oral liquid 160 mg/5 mL  1,000 mg Oral 4 times per day  . antiseptic oral rinse  15 mL Mouth Rinse QID  . chlorhexidine  15 mL Mouth Rinse BID  . folic acid  1 mg Intravenous Daily  . insulin aspart  0-15 Units Subcutaneous 6 times per day  . ipratropium-albuterol  3 mL Nebulization Q4H  . metoCLOPramide (REGLAN) injection  10 mg Intravenous 4 times per day  . piperacillin-tazobactam (ZOSYN)  IV  3.375 g Intravenous Q8H  . polyethylene glycol  17 g Per Tube Daily  . senna-docusate  1 tablet Oral QHS  . sodium chloride  3 mL Intravenous Q12H  . vancomycin  750 mg Intravenous Q8H   Continuous Infusions: . dexmedetomidine 0.4 mcg/kg/hr (06/13/13 0644)  . dextrose 5 % and 0.45% NaCl 100 mL/hr at 06/12/13 2127  . fentaNYL infusion INTRAVENOUS 250 mcg/hr (06/12/13 2200)  . ropivacaine (PF) 2 mg/ml (0.2%) Stopped (06/11/13 1030)   PRN Meds:bisacodyl, diphenhydrAMINE, fentaNYL, HYDROmorphone (DILAUDID) injection, ipratropium-albuterol, ondansetron (ZOFRAN) IV, ondansetron (ZOFRAN) IV, ondansetron, potassium chloride, sodium chloride  No Known Allergies Active Problems:   Fall from ladder   Acute respiratory failure   Altered mental status   Pneumonia, organism unspecified   Hemothorax on left  Vent Mode:  [-] PRVC FiO2 (%):  [30 %-40 %] 40 % Set Rate:  [20 bmp] 20 bmp Vt Set:  [550 mL] 550 mL PEEP:  [5 cmH20] 5 cmH20 Pressure Support:  [5 cmH20-8 cmH20] 8  cmH20 Plateau Pressure:  [19 cmH20-21 cmH20] 20 cmH20    Blood pressure 118/69, pulse 86, temperature 99.7 F (37.6 C), temperature source Axillary, resp. rate 20, height 5\' 9"  (1.753 m), weight 170 lb 6.7 oz (77.3 kg), SpO2 96.00%.  Subjective appears comfortable on the vent. Pain is controlled , tolerating vent weening  Objective Physical Examination: General appearance - NAD, appears comfortable Chest - dim on left Heart - normal rate, regular rhythm, normal S1, S2, no murmurs, rubs, clicks or gallops Abdomen - + BS, soft, non-tender Extremities - warm, well perfused, no edema, PAS in place   Assessment & PlanCont to wean vent per trauma svc Chest tubes- no air leak, 230 cc drained yesterday- poss begin to d/c soon Cont current abx   CBC Latest Ref Rng 06/13/2013 06/12/2013 06/11/2013  WBC 4.0 - 10.5 K/uL 11.7(H) 10.9(H) 12.2(H)  Hemoglobin 13.0 - 17.0 g/dL 9.9(L) 10.5(L) 11.2(L)  Hematocrit 39.0 - 52.0 % 29.5(L) 31.5(L) 33.6(L)  Platelets 150 - 400 K/uL 243 251 226   BMET    Component Value Date/Time   NA 140 06/13/2013 0610   K 4.3 06/13/2013 0610   CL 103 06/13/2013 0610   CO2 28 06/13/2013 0610   GLUCOSE 130* 06/13/2013 0610   BUN 17 06/13/2013 0610  CREATININE 0.53 06/13/2013 0610   CALCIUM 8.4 06/13/2013 0610   GFRNONAA >90 06/13/2013 0610   GFRAA >90 06/13/2013 0610    Results for orders placed during the hospital encounter of 06/04/13  MRSA PCR SCREENING     Status: None   Collection Time    06/05/13  1:08 AM      Result Value Ref Range Status   MRSA by PCR NEGATIVE  NEGATIVE Final   Comment:            The GeneXpert MRSA Assay (FDA     approved for NASAL specimens     only), is one component of a     comprehensive MRSA colonization     surveillance program. It is not     intended to diagnose MRSA     infection nor to guide or     monitor treatment for     MRSA infections.  CULTURE, RESPIRATORY (NON-EXPECTORATED)     Status: None   Collection Time    06/06/13   6:21 PM      Result Value Ref Range Status   Specimen Description TRACHEAL ASPIRATE   Final   Special Requests NONE   Final   Gram Stain     Final   Value: RARE WBC PRESENT,BOTH PMN AND MONONUCLEAR     FEW SQUAMOUS EPITHELIAL CELLS PRESENT     ABUNDANT GRAM POSITIVE RODS     FEW GRAM POSITIVE COCCI IN PAIRS     RARE GRAM NEGATIVE RODS   Culture     Final   Value: Non-Pathogenic Oropharyngeal-type Flora Isolated.     Performed at [facility]   Report Status 06/08/2013 FINAL   Final  CULTURE, BAL-QUANTITATIVE     Status: None   Collection Time    06/07/13 10:05 AM      Result Value Ref Range Status   Specimen Description BRONCHIAL ALVEOLAR LAVAGE   Final   Special Requests NONE   Final   Gram Stain     Final   Value: ABUNDANT WBC PRESENT,BOTH PMN AND MONONUCLEAR     NO SQUAMOUS EPITHELIAL CELLS SEEN     NO ORGANISMS SEEN     Performed at [facility]     Final   Value: 5,000 COLONIES/ML     Performed at [facility]   Culture     Final   Value: Non-Pathogenic Oropharyngeal-type Flora Isolated.     Performed at [facility]   Report Status 06/09/2013 FINAL   Final  CULTURE, RESPIRATORY (NON-EXPECTORATED)     Status: None   Collection Time    06/11/13  8:28 AM      Result Value Ref Range Status   Specimen Description BRONCHIAL WASHINGS   Final   Special Requests PT ON ZUSYN   Final   Gram Stain     Final   Value: FEW WBC PRESENT,BOTH PMN AND MONONUCLEAR     RARE SQUAMOUS EPITHELIAL CELLS PRESENT     NO ORGANISMS SEEN     Performed at [facility]   Culture     Final   Value: NO GROWTH 2 DAYS     Performed at [facility]   Report Status 06/13/2013 FINAL   Final  BODY FLUID CULTURE     Status: None   Collection Time    06/11/13  9:22 AM      Result Value Ref Range Status   Specimen Description  PLEURAL FLUID LEFT   Final   Special Requests PT ON ZOSYN   Final   Gram Stain     Final   Value: FEW WBC  PRESENT,BOTH PMN AND MONONUCLEAR     NO ORGANISMS SEEN     Performed at [facility]   Culture     Final   Value: NO GROWTH 1 DAY     Performed at [facility]   Report Status PENDING   Incomplete   ABG    Component Value Date/Time   PHART 7.396 06/12/2013 0404   PCO2ART 46.2* 06/12/2013 0404   PO2ART 82.2 06/12/2013 0404   HCO3 27.8* 06/12/2013 0404   TCO2 29.3 06/12/2013 0404   O2SAT 98.3 06/12/2013 0404      Dg Chest Port 1 View  06/13/2013   CLINICAL DATA:  Prior thoracotomy.  EXAM: PORTABLE CHEST - 1 VIEW  COMPARISON:  Chest x-ray 06/12/2013.  FINDINGS: Endotracheal tube, NG tube, central line in stable position. Three left chest tubes in stable position. Previously identified left small pneumothorax no longer visualized. Basilar atelectatic changes are present particularly on the left. Stable left pleural effusion. Stable cardiomegaly, normal pulmonary vascularity. Multiple bilateral rib fractures again noted. Left clavicular fracture is present.  IMPRESSION: 1. Stable line and tube positions. 2. Three left chest tubes in stable position. Previously identified small pneumothorax no longer visualized. 3. Persistent bibasilar atelectasis particularly on the left. Small stable left pleural effusion. 4. Multiple bilateral rib fractures and left clavicular fracture again noted.   Electronically Signed   By: Marcello Moores  Register   On: 06/13/2013 07:36   Dg Chest Port 1 View  06/12/2013   CLINICAL DATA:  Hypoxia  EXAM: PORTABLE CHEST - 1 VIEW  COMPARISON:  June 11, 2013  FINDINGS: Endotracheal tube tip is 5.3 cm above the carina. Nasogastric tube tip and side port are in the stomach. Central catheter tip is in the superior cava. There are 3 chest tubes on the left. Only a a rather minimal left lateral pneumothorax remains. There are multiple rib fractures on the left. There is left base consolidation with effusion. Right lung is clear. Heart size and pulmonary vascularity are normal. No  apparent adenopathy.  IMPRESSION: Rather minimal pneumothorax remaining on the left laterally. There are 3 chest tubes in place. There does remain effusion and consolidation in the left base. Multiple left rib fractures are again noted. Right lung is clear. Other tubes and catheters as described.   Electronically Signed   By: Lowella Grip M.D.   On: 06/12/2013 07:25   Dg Chest Portable 1 View  06/11/2013   CLINICAL DATA:  Post bronchoscopy.  EXAM: PORTABLE CHEST - 1 VIEW  COMPARISON:  06/11/2013 and CT chest 06/09/2013.  FINDINGS: Endotracheal tube terminates 2.8 cm above the carina. Nasogastric tube terminates in the stomach. Right subclavian central line tip projects over the SVC. Three left chest tubes are in place without a definite pneumothorax. There is airspace disease at the left lung base, slightly improved from 06/11/2013. Complete a near-complete resolution of previously seen left pleural effusion/hemothorax. Right lung is clear.  Acute left rib fractures with subcutaneous emphysema along the left chest wall. Old right rib fractures.  IMPRESSION: 1. No definite left pneumothorax with 3 chest tubes in place. 2. Improving left basilar aeration with interval decrease in left pleural fluid/hemothorax. 3. Multiple left rib fractures with subcutaneous emphysema along the left chest wall.   Electronically Signed   By: Lorin Picket  M.D.   On: 06/11/2013 11:32   GOLD,WAYNE E 06/13/2013  Will leave all chest tubes in place today Hopefully patient can be weaned from vent soon CXR in am OK to start low dose Lovenox  patient examined and medical record reviewed,agree with above note. VAN TRIGT III,PETER 06/13/2013

## 2013-06-13 NOTE — Progress Notes (Signed)
Rehab Admissions Coordinator Note:  Patient was screened by Stephanye Finnicum L for appropriateness for an Inpatient Acute Rehab Consult.  At this time, we are recommending Inpatient Rehab consult.  Lillyen Schow, PT 06/13/2013, 2:19 PM  I can be reached at [phone].

## 2013-06-14 ENCOUNTER — Inpatient Hospital Stay (HOSPITAL_COMMUNITY): Payer: Self-pay

## 2013-06-14 DIAGNOSIS — S2239XA Fracture of one rib, unspecified side, initial encounter for closed fracture: Secondary | ICD-10-CM

## 2013-06-14 DIAGNOSIS — W19XXXA Unspecified fall, initial encounter: Secondary | ICD-10-CM

## 2013-06-14 DIAGNOSIS — S42009A Fracture of unspecified part of unspecified clavicle, initial encounter for closed fracture: Secondary | ICD-10-CM

## 2013-06-14 DIAGNOSIS — S270XXA Traumatic pneumothorax, initial encounter: Secondary | ICD-10-CM

## 2013-06-14 DIAGNOSIS — I10 Essential (primary) hypertension: Secondary | ICD-10-CM

## 2013-06-14 LAB — TYPE AND SCREEN
ABO/RH(D): A POS
ANTIBODY SCREEN: NEGATIVE
UNIT DIVISION: 0
Unit division: 0
Unit division: 0
Unit division: 0

## 2013-06-14 LAB — BODY FLUID CULTURE: Culture: NO GROWTH

## 2013-06-14 LAB — CBC
HEMATOCRIT: 31.3 % — AB (ref 39.0–52.0)
Hemoglobin: 10.4 g/dL — ABNORMAL LOW (ref 13.0–17.0)
MCH: 32.4 pg (ref 26.0–34.0)
MCHC: 33.2 g/dL (ref 30.0–36.0)
MCV: 97.5 fL (ref 78.0–100.0)
PLATELETS: 309 10*3/uL (ref 150–400)
RBC: 3.21 MIL/uL — ABNORMAL LOW (ref 4.22–5.81)
RDW: 13.1 % (ref 11.5–15.5)
WBC: 12 10*3/uL — ABNORMAL HIGH (ref 4.0–10.5)

## 2013-06-14 LAB — GLUCOSE, CAPILLARY
GLUCOSE-CAPILLARY: 91 mg/dL (ref 70–99)
GLUCOSE-CAPILLARY: 123 mg/dL — AB (ref 70–99)
GLUCOSE-CAPILLARY: 160 mg/dL — AB (ref 70–99)
Glucose-Capillary: 102 mg/dL — ABNORMAL HIGH (ref 70–99)
Glucose-Capillary: 110 mg/dL — ABNORMAL HIGH (ref 70–99)
Glucose-Capillary: 112 mg/dL — ABNORMAL HIGH (ref 70–99)
Glucose-Capillary: 125 mg/dL — ABNORMAL HIGH (ref 70–99)

## 2013-06-14 MED ORDER — BETHANECHOL CHLORIDE 25 MG PO TABS
25.0000 mg | ORAL_TABLET | Freq: Three times a day (TID) | ORAL | Status: DC
  Administered 2013-06-14 (×2): 25 mg via ORAL
  Filled 2013-06-14 (×5): qty 1

## 2013-06-14 MED ORDER — OXYCODONE HCL 5 MG PO TABS
5.0000 mg | ORAL_TABLET | ORAL | Status: DC | PRN
  Administered 2013-06-14 (×3): 10 mg via ORAL
  Filled 2013-06-14 (×3): qty 2

## 2013-06-14 MED ORDER — TAMSULOSIN HCL 0.4 MG PO CAPS
0.4000 mg | ORAL_CAPSULE | Freq: Every day | ORAL | Status: DC
  Administered 2013-06-14 – 2013-06-22 (×8): 0.4 mg via ORAL
  Filled 2013-06-14 (×9): qty 1

## 2013-06-14 MED ORDER — METOPROLOL TARTRATE 25 MG PO TABS
25.0000 mg | ORAL_TABLET | Freq: Two times a day (BID) | ORAL | Status: DC
  Administered 2013-06-14 (×2): 25 mg via ORAL
  Filled 2013-06-14 (×4): qty 1

## 2013-06-14 MED ORDER — HYDRALAZINE HCL 20 MG/ML IJ SOLN
10.0000 mg | INTRAMUSCULAR | Status: DC | PRN
  Administered 2013-06-14: 10 mg via INTRAVENOUS
  Filled 2013-06-14: qty 1

## 2013-06-14 MED ORDER — BIOTENE DRY MOUTH MT LIQD
15.0000 mL | Freq: Two times a day (BID) | OROMUCOSAL | Status: DC
  Administered 2013-06-14 – 2013-06-21 (×12): 15 mL via OROMUCOSAL

## 2013-06-14 NOTE — Progress Notes (Addendum)
Received order from MD to:  remove anterior chest tube - leave remaining tubes to suction.  Heather, charge RN notified and advised that this RN is not signed off to remove chest tubes at this institution.    Pt updated that one chest tube will be removed today - pt remains diaphoretic and aprehensive at baseline, when asked how he's feeling, pt responds, "I feel fine, much better".  Asked if he's sweaty at home, and he states, "yes".  Fan ordered and now at pt's bedside.    Pt currently resting with eyes closed.  Will continue to closely monitor.

## 2013-06-14 NOTE — Progress Notes (Signed)
Physical Therapy Treatment Patient Details Name: James [name] MRN: [id number] DOB: [birth year] Today's Date: 06/14/2013    History of Present Illness Patient is a 53 yo male suffered fall from ladder about 6 feet resulting in multiple rib fractures as well as left comminuted clavicle fracture, occult loculated pneumothorax and small left hemothorax. Pt with respiratory decline placed on ventilator support with intubation on 6/4, extubated 6/10.      PT Comments    Pt admitted with above. Pt currently with functional limitations due to balance and endurance deficits as well as pain.  Pt will benefit from skilled PT to increase their independence and safety with mobility to allow discharge to the venue listed below.   Follow Up Recommendations  CIR     Equipment Recommendations  Other (comment) (TBA)    Recommendations for Other Services Rehab consult     Precautions / Restrictions Precautions Precautions: Fall Precaution Comments: Pt with displaced Lt. clavicle fracture.  No orders for Lt. UE.  Per orho notes, sling Lt. UE and follow up with them as OP; chest tube (chest tube) Required Braces or Orthoses: Other Brace/Splint (LUE figure 8 brace) Restrictions Weight Bearing Restrictions: Yes LUE Weight Bearing: Non weight bearing    Mobility  Bed Mobility Overal bed mobility: Needs Assistance;+2 for physical assistance Bed Mobility: Supine to Sit     Supine to sit: +2 for physical assistance;Mod assist     General bed mobility comments: Pt able to participate but need to use pad to help move hips to edge of bed  Transfers Overall transfer level: Needs assistance Equipment used: 1 person hand held assist Transfers: Sit to/from Stand Sit to Stand: Mod assist;+2 physical assistance         General transfer comment: Took side steps up to [facility].  Then stood to be cleaned of BM.  Somewhat limited in ambulation as pt cannot come off wall suction.    Ambulation/Gait                  Stairs            Wheelchair Mobility    Modified Rankin (Stroke Patients Only)       Balance Overall balance assessment: Needs assistance;History of Falls Sitting-balance support: Feet supported;Single extremity supported Sitting balance-Leahy Scale: Poor   Postural control: Posterior lean Standing balance support: Single extremity supported;During functional activity Standing balance-Leahy Scale: Poor Standing balance comment: has to have UE support on 1                    Cognition Arousal/Alertness: Awake/alert Behavior During Therapy: WFL for tasks assessed/performed Overall Cognitive Status: Within Functional Limits for tasks assessed                      Exercises General Exercises - Lower Extremity Ankle Circles/Pumps: AROM;Both;10 reps;Supine Long Arc Quad: AROM;5 reps;Both;Seated    General Comments General comments (skin integrity, edema, etc.): 3LO2      Pertinent Vitals/Pain VSS on 3LO2, some soreness per pt. Has PCA.    Home Living                      Prior Function            PT Goals (current goals can now be found in the care plan section) Progress towards PT goals: Progressing toward goals    Frequency  Min 4X/week    PT Plan Current plan remains  appropriate    Co-evaluation             End of Session Equipment Utilized During Treatment: Gait belt;Other (comment);Oxygen (sling LUE) Activity Tolerance: Patient limited by pain Patient left: in bed;with nursing/sitter in room;with bed alarm set;with call bell/phone within reach (nursing to pull chest tube so had to leave pt in bed. )     Time: 5686-1683 PT Time Calculation (min): 24 min  Charges:  $Therapeutic Exercise: 8-22 mins $Therapeutic Activity: 8-22 mins                    G Codes:      INGOLD,Chatara Lucente Jul 05, 2013, 1:14 PM [facility] [phone] (pager)

## 2013-06-14 NOTE — Progress Notes (Signed)
Pt up to chair x1 asst for lunch.  Pt reports generalized weakness but is able to stand and pivot to chair.  Minimal c/o pain and discomfort "around those tubes in my side".  Pt's call light and PCA pump button at pt's side - pt re-educated not to get up without assistance.  Pt verbalizes understanding and states he will not.  Pt currently watching tv up in chair eating lunch.  Will continue to closely monitor.

## 2013-06-14 NOTE — Progress Notes (Signed)
Patient ID: James [name], male   DOB: 02-09-1960, 53 y.o.   MRN: [id number] 3 Days Post-Op  Subjective: BM this AM, BP elevated, no new complaints  Objective: Vital signs in last 24 hours: Temp:  [98.2 F (36.8 C)-99.2 F (37.3 C)] 98.3 F (36.8 C) (06/11 0756) Pulse Rate:  [25-117] 106 (06/11 0800) Resp:  [13-30] 28 (06/11 0800) BP: (119-222)/(70-115) 222/95 mmHg (06/11 0841) SpO2:  [83 %-100 %] 98 % (06/11 0800) Arterial Line BP: (132-167)/(60-68) 167/68 mmHg (06/10 1000) FiO2 (%):  [40 %] 40 % (06/10 0900) Last BM Date: 06/14/13  Intake/Output from previous day: 06/10 0701 - 06/11 0700 In: 2616 [P.O.:120; I.V.:2496] Out: 2150 [Urine:2010; Chest Tube:140] Intake/Output this shift: Total I/O In: 100 [I.V.:100] Out: -   General appearance: alert and cooperative Resp: some rhonchi L>R Chest wall: dressing L, On-Q Cardio: regular rate and rhythm GI: soft, NT, +BS Extremities: no edema  Lab Results: CBC   Recent Labs  06/13/13 0610 06/14/13 0630  WBC 11.7* 12.0*  HGB 9.9* 10.4*  HCT 29.5* 31.3*  PLT 243 309   BMET  Recent Labs  06/12/13 0430 06/13/13 0610  NA 137 140  K 4.2 4.3  CL 99 103  CO2 27 28  GLUCOSE 146* 130*  BUN 20 17  CREATININE 0.61 0.53  CALCIUM 8.6 8.4   PT/INR No results found for this basename: LABPROT, INR,  in the last 72 hours ABG  Recent Labs  06/11/13 1230 06/12/13 0404  PHART 7.380 7.396  HCO3 27.8* 27.8*    Studies/Results: Dg Chest Port 1 View  06/14/2013   CLINICAL DATA:  Followup thoracotomy  EXAM: PORTABLE CHEST - 1 VIEW  COMPARISON:  06/13/2013  FINDINGS: Allowing for differences in patient positioning, there has been no significant change. 3 chest tubes are noted on the left, stable. There is left lung opacity that extends from the perihilar mid lung to the lung base where it is more confluent. There is at least a small pleural effusion. There is no convincing pneumothorax. Multiple rib fractures on the left are  stable.  Right lung is hyperexpanded. There are mildly thickened interstitial markings. No right pleural effusion or pneumothorax.  Cardiac silhouette is mildly enlarged.  Right subclavian central venous line is stable and well positioned.  IMPRESSION: 1. No significant change from the previous exam. 2. No pneumothorax. 3. Left lung opacity, probable small left effusion and volume loss on the left, as well as the 3 left-sided chest tubes, are all stable.   Electronically Signed   By: Lajean Manes M.D.   On: 06/14/2013 07:43   Dg Chest Port 1 View  06/13/2013   CLINICAL DATA:  Prior thoracotomy.  EXAM: PORTABLE CHEST - 1 VIEW  COMPARISON:  Chest x-ray 06/12/2013.  FINDINGS: Endotracheal tube, NG tube, central line in stable position. Three left chest tubes in stable position. Previously identified left small pneumothorax no longer visualized. Basilar atelectatic changes are present particularly on the left. Stable left pleural effusion. Stable cardiomegaly, normal pulmonary vascularity. Multiple bilateral rib fractures again noted. Left clavicular fracture is present.  IMPRESSION: 1. Stable line and tube positions. 2. Three left chest tubes in stable position. Previously identified small pneumothorax no longer visualized. 3. Persistent bibasilar atelectasis particularly on the left. Small stable left pleural effusion. 4. Multiple bilateral rib fractures and left clavicular fracture again noted.   Electronically Signed   By: Marcello Moores  Register   On: 06/13/2013 07:36    Anti-infectives: Anti-infectives  Start     Dose/Rate Route Frequency Ordered Stop   06/10/13 2200  vancomycin (VANCOCIN) IVPB 750 mg/150 ml premix  Status:  Discontinued     750 mg 150 mL/hr over 60 Minutes Intravenous Every 8 hours 06/10/13 1421 06/13/13 0934   06/09/13 1330  vancomycin (VANCOCIN) IVPB 750 mg/150 ml premix  Status:  Discontinued     750 mg 150 mL/hr over 60 Minutes Intravenous Every 8 hours 06/09/13 1314 06/10/13 1311    06/07/13 1000  vancomycin (VANCOCIN) IVPB 750 mg/150 ml premix  Status:  Discontinued     750 mg 150 mL/hr over 60 Minutes Intravenous Every 12 hours 06/06/13 2100 06/09/13 1314   06/06/13 2200  piperacillin-tazobactam (ZOSYN) IVPB 3.375 g  Status:  Discontinued     3.375 g 12.5 mL/hr over 240 Minutes Intravenous Every 8 hours 06/06/13 2100 06/13/13 0934   06/06/13 2115  vancomycin (VANCOCIN) IVPB 1000 mg/200 mL premix     1,000 mg 200 mL/hr over 60 Minutes Intravenous  Once 06/06/13 2100 06/06/13 2256      Assessment/Plan: Fall from ladder  Acute respiratory failure/Multiple left sided rib fracture/HPTX -  -S/P VATS, drainage of hemothorax, decortication, repair of lung laceration, resection of LLL wedge---Dr. Lucianne Lei Trigt 06/11/13  -Left CTs per TCTS  -ON-Q device to be D/C today Left comminuted clavicle fracture-Dr. Rolena Infante following. Figure 8 strap, outpatient follow up with Dr. Onnie Graham  VTE - SCD's, Lovenox  HTN - hydralazine PRN, start scheduled lopressor FEN - tolerated clears, advance to reg diet ID - off ABX, CXs neg so far ABL anemia -  stable Hx opioid dependence  Dispo -- continue ICU for BP and resp status  LOS: 10 days    Georganna Skeans, MD, MPH, FACS Trauma: [phone] General Surgery: [phone]  06/14/2013

## 2013-06-14 NOTE — Progress Notes (Addendum)
Everlena Cooper, RN from 3S supervised Nira Conn, RN on removal of anterior chest tube leaving the two remaining chest tubes to suction with no issues.  Remaining two chest tubes redressed and to suction per MD order.  Pt c/o minimal pain during chest tube manipulation - PCA pump utilized per order.  Pt currently watching tv - no changes in O2 sat or O2 demand, no SOB per pt report.  Also removed q ball/pain pump from left side per MD order / work list.  Will continue to closely monitor.

## 2013-06-14 NOTE — Progress Notes (Signed)
Report called to [facility], on 3S. All questions answered. Pt updated on transfer and room number. Rosanne Ashing, sister, called and notified of pt's transferred. All questions and concerns addressed. Sister stated she will update rest of family. All belongings transferred with pt.

## 2013-06-14 NOTE — Progress Notes (Addendum)
Grandville Silos, MD notified of pt's increase in BP - PRN hydraline given per MD order to maintain SBP less than 160 per order.  While on the bed pan, pt's HR increases to 120-130s, decrease in O2 sat to 90% while on 3L [place] - MD bedside and aware.    PO pain meds given per MD order to address discomfort and encouraged pt to utilize his PCA pump before he feels uncomfortable - pt verbalizes understanding and has no questions or concerns at this time.  Will continue to closely monitor.

## 2013-06-14 NOTE — Progress Notes (Addendum)
[address]       [place] [address]             [phone]      James [name] is a 53 y.o. male patient.  1. Multiple rib fractures   2. Fall   3. Acute respiratory failure   4. Altered mental status   5. Pneumonia, organism unspecified   6. Pleural effusion   7. ETOH abuse    Past Medical History  Diagnosis Date  . Pneumothorax    No past surgical history pertinent negatives on file. Scheduled Meds: . acetaminophen  1,000 mg Oral 4 times per day   Or  . acetaminophen (TYLENOL) oral liquid 160 mg/5 mL  1,000 mg Oral 4 times per day  . antiseptic oral rinse  15 mL Mouth Rinse BID  . enoxaparin (LOVENOX) injection  40 mg Subcutaneous Q24H  . folic acid  1 mg Intravenous Daily  . furosemide  20 mg Intravenous Daily  . HYDROmorphone PCA 0.3 mg/mL   Intravenous 6 times per day  . insulin aspart  0-15 Units Subcutaneous 6 times per day  . ipratropium-albuterol  3 mL Nebulization Q4H  . metoCLOPramide (REGLAN) injection  10 mg Intravenous 4 times per day  . polyethylene glycol  17 g Per Tube Daily  . senna-docusate  1 tablet Oral QHS  . sodium chloride  3 mL Intravenous Q12H   Continuous Infusions: . dexmedetomidine Stopped (06/13/13 0930)  . dextrose 5 % and 0.45% NaCl 100 mL/hr at 06/12/13 2127  . fentaNYL infusion INTRAVENOUS Stopped (06/13/13 1022)  . ropivacaine (PF) 2 mg/ml (0.2%) Stopped (06/11/13 1030)   PRN Meds:bisacodyl, diphenhydrAMINE, HYDROmorphone (DILAUDID) injection, ipratropium-albuterol, naloxone, ondansetron (ZOFRAN) IV, ondansetron (ZOFRAN) IV, ondansetron, potassium chloride, sodium chloride, sodium chloride   Results for orders placed during the hospital encounter of 06/04/13  MRSA PCR SCREENING     Status: None   Collection Time    06/05/13  1:08 AM      Result Value Ref Range Status   MRSA by PCR NEGATIVE  NEGATIVE Final   Comment:            The GeneXpert MRSA Assay (FDA     approved for NASAL specimens     only),  is one component of a     comprehensive MRSA colonization     surveillance program. It is not     intended to diagnose MRSA     infection nor to guide or     monitor treatment for     MRSA infections.  CULTURE, RESPIRATORY (NON-EXPECTORATED)     Status: None   Collection Time    06/06/13  6:21 PM      Result Value Ref Range Status   Specimen Description TRACHEAL ASPIRATE   Final   Special Requests NONE   Final   Gram Stain     Final   Value: RARE WBC PRESENT,BOTH PMN AND MONONUCLEAR     FEW SQUAMOUS EPITHELIAL CELLS PRESENT     ABUNDANT GRAM POSITIVE RODS     FEW GRAM POSITIVE COCCI IN PAIRS     RARE GRAM NEGATIVE RODS   Culture     Final   Value: Non-Pathogenic Oropharyngeal-type Flora Isolated.     Performed at [facility]   Report Status 06/08/2013 FINAL   Final  CULTURE, BAL-QUANTITATIVE     Status: None   Collection Time    06/07/13 10:05 AM  Result Value Ref Range Status   Specimen Description BRONCHIAL ALVEOLAR LAVAGE   Final   Special Requests NONE   Final   Gram Stain     Final   Value: ABUNDANT WBC PRESENT,BOTH PMN AND MONONUCLEAR     NO SQUAMOUS EPITHELIAL CELLS SEEN     NO ORGANISMS SEEN     Performed at [facility]     Final   Value: 5,000 COLONIES/ML     Performed at [facility]   Culture     Final   Value: Non-Pathogenic Oropharyngeal-type Flora Isolated.     Performed at [facility]   Report Status 06/09/2013 FINAL   Final  CULTURE, RESPIRATORY (NON-EXPECTORATED)     Status: None   Collection Time    06/11/13  8:28 AM      Result Value Ref Range Status   Specimen Description BRONCHIAL WASHINGS   Final   Special Requests PT ON ZUSYN   Final   Gram Stain     Final   Value: FEW WBC PRESENT,BOTH PMN AND MONONUCLEAR     RARE SQUAMOUS EPITHELIAL CELLS PRESENT     NO ORGANISMS SEEN     Performed at [facility]   Culture     Final   Value: NO GROWTH 2 DAYS     Performed at [facility]   Report Status 06/13/2013 FINAL   Final  BODY FLUID CULTURE     Status: None   Collection Time    06/11/13  9:22 AM      Result Value Ref Range Status   Specimen Description PLEURAL FLUID LEFT   Final   Special Requests PT ON ZOSYN   Final   Gram Stain     Final   Value: FEW WBC PRESENT,BOTH PMN AND MONONUCLEAR     NO ORGANISMS SEEN     Performed at [facility]   Culture     Final   Value: NO GROWTH 2 DAYS     Performed at [facility]   Report Status PENDING   Incomplete   Results for orders placed during the hospital encounter of 06/04/13 (from the past 48 hour(s))  GLUCOSE, CAPILLARY     Status: Abnormal   Collection Time    06/12/13  8:12 AM      Result Value Ref Range   Glucose-Capillary 121 (*) 70 - 99 mg/dL  GLUCOSE, CAPILLARY     Status: Abnormal   Collection Time    06/12/13 11:49 AM      Result Value Ref Range   Glucose-Capillary 133 (*) 70 - 99 mg/dL  GLUCOSE, CAPILLARY     Status: Abnormal   Collection Time    06/12/13  3:28 PM      Result Value Ref Range   Glucose-Capillary 109 (*) 70 - 99 mg/dL   Comment 1 Notify RN     Comment 2 Documented in Chart    GLUCOSE, CAPILLARY     Status: Abnormal   Collection Time    06/12/13  7:54 PM      Result Value Ref Range   Glucose-Capillary 108 (*) 70 - 99 mg/dL   Comment 1 Notify RN     Comment 2 Documented in Chart    GLUCOSE, CAPILLARY     Status: Abnormal   Collection Time    06/12/13 11:54 PM      Result Value Ref Range   Glucose-Capillary 107 (*)  70 - 99 mg/dL  GLUCOSE, CAPILLARY     Status: Abnormal   Collection Time    06/13/13  3:50 AM      Result Value Ref Range   Glucose-Capillary 119 (*) 70 - 99 mg/dL  CBC     Status: Abnormal   Collection Time    06/13/13  6:10 AM      Result Value Ref Range   WBC 11.7 (*) 4.0 - 10.5 K/uL   RBC 3.03 (*) 4.22 - 5.81 MIL/uL   Hemoglobin 9.9 (*) 13.0 - 17.0 g/dL   HCT 29.5 (*) 39.0 - 52.0 %   MCV 97.4  78.0 - 100.0 fL   MCH 32.7  26.0 -  34.0 pg   MCHC 33.6  30.0 - 36.0 g/dL   RDW 13.2  11.5 - 15.5 %   Platelets 243  150 - 400 K/uL  COMPREHENSIVE METABOLIC PANEL     Status: Abnormal   Collection Time    06/13/13  6:10 AM      Result Value Ref Range   Sodium 140  137 - 147 mEq/L   Potassium 4.3  3.7 - 5.3 mEq/L   Chloride 103  96 - 112 mEq/L   CO2 28  19 - 32 mEq/L   Glucose, Bld 130 (*) 70 - 99 mg/dL   BUN 17  6 - 23 mg/dL   Creatinine, Ser 0.53  0.50 - 1.35 mg/dL   Calcium 8.4  8.4 - 10.5 mg/dL   Total Protein 5.2 (*) 6.0 - 8.3 g/dL   Albumin 2.2 (*) 3.5 - 5.2 g/dL   AST 50 (*) 0 - 37 U/L   ALT 44  0 - 53 U/L   Alkaline Phosphatase 41  39 - 117 U/L   Total Bilirubin 0.3  0.3 - 1.2 mg/dL   GFR calc non Af Amer >90  >90 mL/min   GFR calc Af Amer >90  >90 mL/min   Comment: (NOTE)     The eGFR has been calculated using the CKD EPI equation.     This calculation has not been validated in all clinical situations.     eGFR's persistently <90 mL/min signify possible Chronic Kidney     Disease.  GLUCOSE, CAPILLARY     Status: Abnormal   Collection Time    06/13/13  8:18 AM      Result Value Ref Range   Glucose-Capillary 126 (*) 70 - 99 mg/dL  TRIGLYCERIDES     Status: None   Collection Time    06/13/13  8:45 AM      Result Value Ref Range   Triglycerides 50  <150 mg/dL  GLUCOSE, CAPILLARY     Status: None   Collection Time    06/13/13 11:59 AM      Result Value Ref Range   Glucose-Capillary 95  70 - 99 mg/dL  GLUCOSE, CAPILLARY     Status: Abnormal   Collection Time    06/13/13  3:50 PM      Result Value Ref Range   Glucose-Capillary 135 (*) 70 - 99 mg/dL   Comment 1 Notify RN     Comment 2 Documented in Chart    GLUCOSE, CAPILLARY     Status: Abnormal   Collection Time    06/13/13  8:22 PM      Result Value Ref Range   Glucose-Capillary 125 (*) 70 - 99 mg/dL  GLUCOSE, CAPILLARY     Status: Abnormal   Collection Time  06/13/13 11:57 PM      Result Value Ref Range   Glucose-Capillary 112 (*) 70 -  99 mg/dL   Comment 1 Documented in Chart     Comment 2 Notify RN    GLUCOSE, CAPILLARY     Status: Abnormal   Collection Time    06/14/13  3:21 AM      Result Value Ref Range   Glucose-Capillary 110 (*) 70 - 99 mg/dL   Comment 1 Documented in Chart     Comment 2 Notify RN    CBC     Status: Abnormal   Collection Time    06/14/13  6:30 AM      Result Value Ref Range   WBC 12.0 (*) 4.0 - 10.5 K/uL   RBC 3.21 (*) 4.22 - 5.81 MIL/uL   Hemoglobin 10.4 (*) 13.0 - 17.0 g/dL   HCT 31.3 (*) 39.0 - 52.0 %   MCV 97.5  78.0 - 100.0 fL   MCH 32.4  26.0 - 34.0 pg   MCHC 33.2  30.0 - 36.0 g/dL   RDW 13.1  11.5 - 15.5 %   Platelets 309  150 - 400 K/uL      No Known Allergies Active Problems:   Fall from ladder   Acute respiratory failure   Altered mental status   Pneumonia, organism unspecified   Hemothorax on left  Blood pressure 162/87, pulse 99, temperature 98.3 F (36.8 C), temperature source Oral, resp. rate 25, height 5' 9"  (1.753 m), weight 170 lb 6.7 oz (77.3 kg), SpO2 92.00%.  Intake/Output Summary (Last 24 hours) at 06/14/13 0817 Last data filed at 06/14/13 0800  Gross per 24 hour  Intake 2583.77 ml  Output   2065 ml  Net 518.77 ml  chest tube 140 cc/24 hr  Subjective feeling better extubated from the vent Objective Physical Examination: General appearance - NAD, somewhat fatigued Chest - somewhat coarse throughout Heart - RRR Abdomen - mild tenderness incis healing well   Assessment & Plan 1 steady improvement 2 poss d/c chest tubes soon 3 push pulm toilet/rehab as able 4 hypertension- primary svc to manage  GOLD,WAYNE E 06/14/2013    CXR with postop atelectasis but may have bled more with LOVENOX- WILL HOLD FOR NOW AND PLACE pas HOSE  REMOVE ANTERIOR CHEST TUBE  patient examined and medical record reviewed,agree with above note. VAN TRIGT III,Jadore Veals 06/14/2013

## 2013-06-14 NOTE — Consult Note (Signed)
Physical Medicine and Rehabilitation Consult Reason for Consult: Multitrauma after a fall Referring Physician: Trauma services   HPI: James [name] is a 53 y.o. right-handed male admitted 06/05/2013. Patient presented after a fall of approximately 6 feet from a ladder without loss of consciousness. Cranial CT scan negative. CT cervical spine negative. X-rays and imaging revealed left comminuted clavicle fracture, multiple left-sided rib fractures with flail chest, Occult loculated pneumothorax and small left hemothorax. Underwent video-assisted thoracoscopy and drainage of hemothorax repair lung laceration resection LLL wedge 06/11/2013 per Dr. Prescott Gum. Followup orthopedic services Dr. Rolena Infante for left clavicle fracture with conservative care and placed in a figure 8 sling nonweightbearing left upper extremity. Hospital course complicated by respiratory decline placed on ventilator support with intubation 06/07/2013 extubated 06/13/2013. Bouts of urinary retention with needs for in and out catheterization. Acute blood loss anemia 9.9 and monitored. Placed on subcutaneous Lovenox for DVT prophylaxis 06/13/2013. Physical therapy evaluation 06/13/2013 with recommendations for physical medicine rehabilitation consult.   Review of Systems  Musculoskeletal: Positive for myalgias.  All other systems reviewed and are negative.  Past Medical History  Diagnosis Date  . Pneumothorax    Past Surgical History  Procedure Laterality Date  . Video bronchoscopy N/A 06/11/2013    Procedure: VIDEO BRONCHOSCOPY;  Surgeon: Ivin Poot, MD;  Location: [facility];  Service: Thoracic;  Laterality: N/A;  . Video assisted thoracoscopy (vats)/thorocotomy Left 06/11/2013    Procedure: VIDEO ASSISTED THORACOSCOPY (VATS)/THOROCOTOMY;  Surgeon: Ivin Poot, MD;  Location: [facility];  Service: Thoracic;  Laterality: Left;   History reviewed. No pertinent family history. Social History:  reports that he has been  smoking Cigarettes.  He has been smoking about 0.00 packs per day. He does not have any smokeless tobacco history on file. He reports that he drinks alcohol. He reports that he uses illicit drugs (Marijuana). Allergies: No Known Allergies No prescriptions prior to admission    Home: Home Living Family/patient expects to be discharged to:: Private residence Living Arrangements: Alone Available Help at Discharge: Family;Friend(s) Type of Home: House Home Access: Stairs to enter Technical brewer of Steps: 2 Entrance Stairs-Rails: Right Home Layout: One level Home Equipment: None Additional Comments: Pt states he may stay with sister post d/c  Functional History: Prior Function Level of Independence: Independent Comments: works as Location manager Status:  Mobility: Bed Mobility Overal bed mobility: Needs Assistance;+2 for physical assistance Bed Mobility: Supine to Sit Supine to sit: +2 for physical assistance;Mod assist General bed mobility comments: Pt able to participate but need to use pad to help move hips to edge of bed Transfers Overall transfer level: Needs assistance Equipment used: 1 person hand held assist (face to face ) Transfers: Sit to/from Stand;Stand Pivot Transfers Sit to Stand: Mod assist Stand pivot transfers: Max assist General transfer comment: Face to face, assist to elevate from bed, max assist to pivot to chair with heavy reliance on therapist support.  Ambulation/Gait Ambulation/Gait assistance: Min assist Ambulation Distance (Feet): 8 Feet Assistive device: 1 person hand held assist    ADL: ADL Overall ADL's : Needs assistance/impaired Eating/Feeding: Set up;Sitting Grooming: Moderate assistance Upper Body Bathing: Moderate assistance;Sitting Lower Body Bathing: Maximal assistance;Sitting/lateral leans Upper Body Dressing : Maximal assistance;Sitting Lower Body Dressing: Total assistance;Sit to/from stand Toilet Transfer: Maximal  assistance;Stand-pivot Toileting- Clothing Manipulation and Hygiene: Maximal assistance Functional mobility during ADLs: Maximal assistance;+2 for safety/equipment General ADL Comments: Further decline in funciton since initial eval  Cognition: Cognition Overall Cognitive  Status: Within Functional Limits for tasks assessed Orientation Level: Oriented to person;Oriented to place;Oriented to situation Cognition Arousal/Alertness: Awake/alert Behavior During Therapy: WFL for tasks assessed/performed Overall Cognitive Status: Within Functional Limits for tasks assessed  Blood pressure 130/76, pulse 85, temperature 98.2 F (36.8 C), temperature source Oral, resp. rate 22, height 5\' 9"  (1.753 m), weight 77.3 kg (170 lb 6.7 oz), SpO2 94.00%. Physical Exam  Constitutional: He is oriented to person, place, and time. He appears well-developed.  HENT:  Head: Normocephalic.  Eyes: EOM are normal.  Neck: Normal range of motion. Neck supple. No thyromegaly present.  Cardiovascular: Normal rate and regular rhythm.   Respiratory:  Decreased breath sounds at the bases but clear to auscultation, CT in place  GI: Soft. Bowel sounds are normal. He exhibits no distension.  Neurological: He is alert and oriented to person, place, and time. No cranial nerve deficit. Coordination normal.  Moves all 4s but limited by pain especially in UE's. Sensation appears grossly intact.   Skin: Skin is warm and dry.  VATS site clean and dry  Psychiatric: He has a normal mood and affect. His behavior is normal.    Results for orders placed during the hospital encounter of 06/04/13 (from the past 24 hour(s))  CBC     Status: Abnormal   Collection Time    06/13/13  6:10 AM      Result Value Ref Range   WBC 11.7 (*) 4.0 - 10.5 K/uL   RBC 3.03 (*) 4.22 - 5.81 MIL/uL   Hemoglobin 9.9 (*) 13.0 - 17.0 g/dL   HCT 29.5 (*) 39.0 - 52.0 %   MCV 97.4  78.0 - 100.0 fL   MCH 32.7  26.0 - 34.0 pg   MCHC 33.6  30.0 - 36.0  g/dL   RDW 13.2  11.5 - 15.5 %   Platelets 243  150 - 400 K/uL  COMPREHENSIVE METABOLIC PANEL     Status: Abnormal   Collection Time    06/13/13  6:10 AM      Result Value Ref Range   Sodium 140  137 - 147 mEq/L   Potassium 4.3  3.7 - 5.3 mEq/L   Chloride 103  96 - 112 mEq/L   CO2 28  19 - 32 mEq/L   Glucose, Bld 130 (*) 70 - 99 mg/dL   BUN 17  6 - 23 mg/dL   Creatinine, Ser 0.53  0.50 - 1.35 mg/dL   Calcium 8.4  8.4 - 10.5 mg/dL   Total Protein 5.2 (*) 6.0 - 8.3 g/dL   Albumin 2.2 (*) 3.5 - 5.2 g/dL   AST 50 (*) 0 - 37 U/L   ALT 44  0 - 53 U/L   Alkaline Phosphatase 41  39 - 117 U/L   Total Bilirubin 0.3  0.3 - 1.2 mg/dL   GFR calc non Af Amer >90  >90 mL/min   GFR calc Af Amer >90  >90 mL/min  GLUCOSE, CAPILLARY     Status: Abnormal   Collection Time    06/13/13  8:18 AM      Result Value Ref Range   Glucose-Capillary 126 (*) 70 - 99 mg/dL  TRIGLYCERIDES     Status: None   Collection Time    06/13/13  8:45 AM      Result Value Ref Range   Triglycerides 50  <150 mg/dL  GLUCOSE, CAPILLARY     Status: None   Collection Time    06/13/13 11:59 AM  Result Value Ref Range   Glucose-Capillary 95  70 - 99 mg/dL  GLUCOSE, CAPILLARY     Status: Abnormal   Collection Time    06/13/13  3:50 PM      Result Value Ref Range   Glucose-Capillary 135 (*) 70 - 99 mg/dL   Comment 1 Notify RN     Comment 2 Documented in Chart    GLUCOSE, CAPILLARY     Status: Abnormal   Collection Time    06/13/13  8:22 PM      Result Value Ref Range   Glucose-Capillary 125 (*) 70 - 99 mg/dL  GLUCOSE, CAPILLARY     Status: Abnormal   Collection Time    06/13/13 11:57 PM      Result Value Ref Range   Glucose-Capillary 112 (*) 70 - 99 mg/dL   Comment 1 Documented in Chart     Comment 2 Notify RN    GLUCOSE, CAPILLARY     Status: Abnormal   Collection Time    06/14/13  3:21 AM      Result Value Ref Range   Glucose-Capillary 110 (*) 70 - 99 mg/dL   Comment 1 Documented in Chart     Comment  2 Notify RN     Dg Chest Port 1 View  06/13/2013   CLINICAL DATA:  Prior thoracotomy.  EXAM: PORTABLE CHEST - 1 VIEW  COMPARISON:  Chest x-ray 06/12/2013.  FINDINGS: Endotracheal tube, NG tube, central line in stable position. Three left chest tubes in stable position. Previously identified left small pneumothorax no longer visualized. Basilar atelectatic changes are present particularly on the left. Stable left pleural effusion. Stable cardiomegaly, normal pulmonary vascularity. Multiple bilateral rib fractures again noted. Left clavicular fracture is present.  IMPRESSION: 1. Stable line and tube positions. 2. Three left chest tubes in stable position. Previously identified small pneumothorax no longer visualized. 3. Persistent bibasilar atelectasis particularly on the left. Small stable left pleural effusion. 4. Multiple bilateral rib fractures and left clavicular fracture again noted.   Electronically Signed   By: Marcello Moores  Register   On: 06/13/2013 07:36    Assessment/Plan: Diagnosis: left clavicle, rib fxs, ptx and flail chest after fall 1. Does the need for close, 24 hr/day medical supervision in concert with the patient's rehab needs make it unreasonable for this patient to be served in a less intensive setting? Yes 2. Co-Morbidities requiring supervision/potential complications: pain, pneumonia 3. Due to bladder management, bowel management, safety, skin/wound care, disease management, medication administration and pain management, does the patient require 24 hr/day rehab nursing? Yes 4. Does the patient require coordinated care of a physician, rehab nurse, PT (1-2 hrs/day, 5 days/week) and OT (1-2 hrs/day, 5 days/week) to address physical and functional deficits in the context of the above medical diagnosis(es)? Yes Addressing deficits in the following areas: balance, endurance, locomotion, strength, transferring, bowel/bladder control, bathing, dressing, feeding, grooming and toileting 5. Can  the patient actively participate in an intensive therapy program of at least 3 hrs of therapy per day at least 5 days per week? Yes 6. The potential for patient to make measurable gains while on inpatient rehab is excellent 7. Anticipated functional outcomes upon discharge from inpatient rehab are modified independent and supervision  with PT, modified independent and supervision with OT, n/a with SLP. 8. Estimated rehab length of stay to reach the above functional goals is: 7-12 days 9. Does the patient have adequate social supports to accommodate these discharge functional goals? Yes 10. Anticipated  D/C setting: Home 11. Anticipated post D/C treatments: [place] therapy 12. Overall Rehab/Functional Prognosis: excellent  RECOMMENDATIONS: This patient's condition is appropriate for continued rehabilitative care in the following setting: CIR Patient has agreed to participate in recommended program. Yes Note that insurance prior authorization may be required for reimbursement for recommended care.  Comment: Rehab Admissions Coordinator to follow up.  Thanks,  [name], MD, Mellody Drown     06/14/2013

## 2013-06-14 NOTE — Progress Notes (Signed)
Cardiothoracic and trauma MD notified of pt's BP, currently 200/91 - advised that pt was just moved on and off the bedpan.  Pt currently visiting with sister, educated on the importance of utilizing PCA pump when uncomfortable - pt verbalizes understanding.  Will continue to closely monitor.

## 2013-06-14 NOTE — Progress Notes (Signed)
Informed of possible trauma coming in needing ICU bed. Dr. Grandville Silos called, notified/updated on pt's progress,  request of possible transfer. Order to transfer pt to 3S

## 2013-06-14 NOTE — Progress Notes (Signed)
UR completed.  Jacek Colson, RN BSN M[facility] CCM Trauma/Neuro ICU Case Manager [phone]  

## 2013-06-15 ENCOUNTER — Inpatient Hospital Stay (HOSPITAL_COMMUNITY): Payer: Self-pay

## 2013-06-15 ENCOUNTER — Inpatient Hospital Stay (HOSPITAL_COMMUNITY): Payer: MEDICAID

## 2013-06-15 DIAGNOSIS — R404 Transient alteration of awareness: Secondary | ICD-10-CM

## 2013-06-15 LAB — GLUCOSE, CAPILLARY
Glucose-Capillary: 109 mg/dL — ABNORMAL HIGH (ref 70–99)
Glucose-Capillary: 102 mg/dL — ABNORMAL HIGH (ref 70–99)
Glucose-Capillary: 121 mg/dL — ABNORMAL HIGH (ref 70–99)
Glucose-Capillary: 111 mg/dL — ABNORMAL HIGH (ref 70–99)
Glucose-Capillary: 110 mg/dL — ABNORMAL HIGH (ref 70–99)
Glucose-Capillary: 103 mg/dL — ABNORMAL HIGH (ref 70–99)
Glucose-Capillary: 96 mg/dL (ref 70–99)

## 2013-06-15 LAB — URINALYSIS, ROUTINE W REFLEX MICROSCOPIC
Bilirubin Urine: NEGATIVE
GLUCOSE, UA: NEGATIVE mg/dL
Hgb urine dipstick: NEGATIVE
Ketones, ur: 15 mg/dL — AB
Leukocytes, UA: NEGATIVE
Nitrite: NEGATIVE
PH: 8 (ref 5.0–8.0)
PROTEIN: NEGATIVE mg/dL
SPECIFIC GRAVITY, URINE: 1.022 (ref 1.005–1.030)
Urobilinogen, UA: 4 mg/dL — ABNORMAL HIGH (ref 0.0–1.0)

## 2013-06-15 LAB — BASIC METABOLIC PANEL
BUN: 12 mg/dL (ref 6–23)
CO2: 27 mEq/L (ref 19–32)
Calcium: 8.8 mg/dL (ref 8.4–10.5)
Chloride: 100 mEq/L (ref 96–112)
Creatinine, Ser: 0.53 mg/dL (ref 0.50–1.35)
GFR calc non Af Amer: >90 mL/min (ref >90)
Glucose, Bld: 109 mg/dL — ABNORMAL HIGH (ref 70–99)
Potassium: 3.2 mEq/L — ABNORMAL LOW (ref 3.7–5.3)
Sodium: 140 mEq/L (ref 137–147)

## 2013-06-15 LAB — HEPATIC FUNCTION PANEL
ALK PHOS: 75 U/L (ref 39–117)
ALT: 51 U/L (ref 0–53)
AST: 67 U/L — ABNORMAL HIGH (ref 0–37)
Albumin: 2.3 g/dL — ABNORMAL LOW (ref 3.5–5.2)
Bilirubin, Direct: <0.2 mg/dL (ref 0.0–0.3)
Total Bilirubin: 0.4 mg/dL (ref 0.3–1.2)
Total Protein: 5.2 g/dL — ABNORMAL LOW (ref 6.0–8.3)

## 2013-06-15 LAB — CBC
HCT: 32.3 % — ABNORMAL LOW (ref 39.0–52.0)
Hemoglobin: 10.7 g/dL — ABNORMAL LOW (ref 13.0–17.0)
MCH: 31.8 pg (ref 26.0–34.0)
MCHC: 33.1 g/dL (ref 30.0–36.0)
MCV: 96.1 fL (ref 78.0–100.0)
PLATELETS: 331 10*3/uL (ref 150–400)
RBC: 3.36 MIL/uL — ABNORMAL LOW (ref 4.22–5.81)
RDW: 12.9 % (ref 11.5–15.5)
WBC: 10 10*3/uL (ref 4.0–10.5)

## 2013-06-15 LAB — AMMONIA: Ammonia: 19 umol/L (ref 11–60)

## 2013-06-15 MED ORDER — METOPROLOL TARTRATE 1 MG/ML IV SOLN: 5.0000 mg | Freq: Four times a day (QID) | INTRAVENOUS | Status: DC | PRN

## 2013-06-15 MED ORDER — DEXTROSE-NACL 5-0.9 % IV SOLN
INTRAVENOUS | Status: DC
  Administered 2013-06-15: 09:00:00 via INTRAVENOUS
  Administered 2013-06-16: 75 mL/h via INTRAVENOUS
  Administered 2013-06-16: 18:00:00 via INTRAVENOUS
  Administered 2013-06-17: 75 mL/h via INTRAVENOUS
  Administered 2013-06-17 – 2013-06-19 (×3): via INTRAVENOUS

## 2013-06-15 MED ORDER — HALOPERIDOL LACTATE 5 MG/ML IJ SOLN
3.0000 mg | Freq: Four times a day (QID) | INTRAMUSCULAR | Status: DC | PRN
  Administered 2013-06-15: 3 mg via INTRAVENOUS
  Filled 2013-06-15: qty 1

## 2013-06-15 MED ORDER — POTASSIUM CHLORIDE 10 MEQ/100ML IV SOLN
10.0000 meq | INTRAVENOUS | Status: AC
  Administered 2013-06-15 (×3): 10 meq via INTRAVENOUS
  Filled 2013-06-15 (×3): qty 100

## 2013-06-15 MED ORDER — DEXMEDETOMIDINE HCL IN NACL 200 MCG/50ML IV SOLN
0.4000 ug/kg/h | INTRAVENOUS | Status: DC
  Administered 2013-06-15 (×2): 0.7 ug/kg/h via INTRAVENOUS
  Administered 2013-06-15: 0.4 ug/kg/h via INTRAVENOUS
  Administered 2013-06-16: 0.3 ug/kg/h via INTRAVENOUS
  Administered 2013-06-16: 0.5 ug/kg/h via INTRAVENOUS
  Administered 2013-06-16: 0.7 ug/kg/h via INTRAVENOUS
  Administered 2013-06-16: 0.4 ug/kg/h via INTRAVENOUS
  Administered 2013-06-17: 0.3 ug/kg/h via INTRAVENOUS
  Filled 2013-06-15 (×8): qty 50

## 2013-06-15 MED ORDER — HALOPERIDOL LACTATE 5 MG/ML IJ SOLN
4.0000 mg | Freq: Four times a day (QID) | INTRAMUSCULAR | Status: DC | PRN
  Administered 2013-06-15: 4 mg via INTRAVENOUS
  Filled 2013-06-15: qty 1

## 2013-06-15 MED ORDER — LORAZEPAM 2 MG/ML IJ SOLN
2.0000 mg | INTRAMUSCULAR | Status: DC | PRN
  Administered 2013-06-15 (×6): 3 mg via INTRAVENOUS
  Administered 2013-06-16: 2 mg via INTRAVENOUS
  Administered 2013-06-16: 3 mg via INTRAVENOUS
  Administered 2013-06-16: 2 mg via INTRAVENOUS
  Administered 2013-06-16: 1 mg via INTRAVENOUS
  Administered 2013-06-16: 2 mg via INTRAVENOUS
  Administered 2013-06-16: 3 mg via INTRAVENOUS
  Administered 2013-06-17 (×5): 2 mg via INTRAVENOUS
  Filled 2013-06-15: qty 1
  Filled 2013-06-15: qty 2
  Filled 2013-06-15 (×3): qty 1
  Filled 2013-06-15 (×3): qty 2
  Filled 2013-06-15: qty 1
  Filled 2013-06-15 (×2): qty 2
  Filled 2013-06-15: qty 1
  Filled 2013-06-15 (×2): qty 2
  Filled 2013-06-15: qty 1
  Filled 2013-06-15 (×2): qty 2
  Filled 2013-06-15: qty 1

## 2013-06-15 MED ORDER — IPRATROPIUM-ALBUTEROL 0.5-2.5 (3) MG/3ML IN SOLN
3.0000 mL | Freq: Four times a day (QID) | RESPIRATORY_TRACT | Status: DC
  Administered 2013-06-15 – 2013-06-19 (×15): 3 mL via RESPIRATORY_TRACT
  Filled 2013-06-15 (×16): qty 3

## 2013-06-15 MED ORDER — LORAZEPAM 2 MG/ML IJ SOLN
1.0000 mg | Freq: Four times a day (QID) | INTRAMUSCULAR | Status: DC | PRN
  Administered 2013-06-15: 1 mg via INTRAVENOUS
  Filled 2013-06-15: qty 1

## 2013-06-15 MED ORDER — POTASSIUM CHLORIDE CRYS ER 20 MEQ PO TBCR: 40.0000 meq | EXTENDED_RELEASE_TABLET | Freq: Once | ORAL | Status: DC

## 2013-06-15 NOTE — Progress Notes (Signed)
Physician notified: Estill Bakes, NP At: 0720  Regarding: Has CIWA protocol for stepdown not proper ativan order. Should be able to give every hour depending on CIWA score. CIWA 32.  Awaiting return response.   Returned Response at: 0721  Order(s): have ativan ordered PRN Q6 1mg  ordered.   RN: not proper protocol, unsafe for patient.

## 2013-06-15 NOTE — Progress Notes (Signed)
Hallucinating. Precedex started with loading dose and titrating up per orders. 3 mg ativan given IV. Monitoring continuously.

## 2013-06-15 NOTE — Progress Notes (Signed)
Occupational Therapy Treatment Patient Details Name: James [name] MRN: [id number] DOB: 1960/06/30 Today's Date: 06/15/2013    History of present illness Patient is a 53 yo male suffered fall from ladder about 6 feet resulting in multiple rib fractures as well as left comminuted clavicle fracture, occult loculated pneumothorax and small left hemothorax. Pt with respiratory decline placed on ventilator support with intubation on 6/4, extubated 6/10.  Increased agitation 06/15/13, Mental status change. ? Withdrawal.    OT comments  Attempted to see this am. Pt with mental status change. Restless, agitated, apparently hallucinating. Per nsg request, attempted to help donn figure 8 brace to support L clavicle fracture. Pt too restless at this time. Educated on "NWB status" LUE and to not mobilize pt with pulling/pusing on L UE. Will return to donn brace if needed.   Follow Up Recommendations  CIR;Supervision - Intermittent    Equipment Recommendations  3 in 1 bedside comode    Recommendations for Other Services Rehab consult    Precautions / Restrictions Precautions Precautions: Fall;Other (comment) Precaution Comments: L clavicle fracture; chest tubes Required Braces or Orthoses: Other Brace/Splint (figure 8  brace)       Mobility Bed Mobility Overal bed mobility: Needs Assistance;+2 for physical assistance Bed Mobility: Rolling Rolling: +2 for physical assistance;Max assist            Transfers                      Balance                                   ADL                                         General ADL Comments: Per nsg request attempted to help donn the figure 8 brace      Vision                     Perception     Praxis      Cognition   Behavior During Therapy: Restless;Agitated Overall Cognitive Status: Impaired/Different from baseline Area of Impairment: Orientation;Attention;Memory;Following  commands;Safety/judgement;Awareness;Problem solving Orientation Level: Disoriented to;Person;Place;Time;Situation Current Attention Level: Focused            General Comments: Pt with cognitive decline after transfer. Pt agitated. apparently hallucinating.    Extremity/Trunk Assessment               Exercises     Shoulder Instructions       General Comments      Pertinent Vitals/ Pain       VSS; unable to rate pain  Home Living                                          Prior Functioning/Environment              Frequency Min 2X/week     Progress Toward Goals  OT Goals(current goals can now be found in the care plan section)  Progress towards OT goals: Not progressing toward goals - comment (increased agitation. mental status change. Will  Re establish goals if needed next week)  Acute Rehab OT Goals Patient  Stated Goal: unable to state OT Goal Formulation: With patient Time For Goal Achievement: 06/27/13 Potential to Achieve Goals: Good ADL Goals Pt Will Perform Grooming: with supervision;standing Pt Will Perform Upper Body Bathing: with supervision;sitting;with set-up Pt Will Perform Lower Body Bathing: with supervision;with adaptive equipment;sit to/from stand Pt Will Perform Upper Body Dressing: with supervision;sitting Pt Will Perform Lower Body Dressing: with supervision;sit to/from stand;with adaptive equipment Pt Will Transfer to Toilet: with supervision;ambulating;regular height toilet;bedside commode;grab bars Pt Will Perform Toileting - Clothing Manipulation and hygiene: with supervision;sit to/from stand Additional ADL Goal #1: Pt will be independent with sling wear and care  Plan Discharge plan remains appropriate    Co-evaluation                 End of Session     Activity Tolerance Treatment limited secondary to agitation   Patient Left in bed;with call bell/phone within reach;with nursing/sitter in room    Nurse Communication Other (comment) (options for sling)        Time: 3888-2800 OT Time Calculation (min): 14 min  Charges: OT General Charges $OT Visit: 1 Procedure OT Treatments $Therapeutic Activity: 8-22 mins  Sharisa Toves,HILLARY 06/15/2013, 9:02 AM   Maurie Boettcher, OTR/L  [phone] 06/15/2013

## 2013-06-15 NOTE — Progress Notes (Addendum)
Pt agitated, CIWA 10 up from 5, pt tachycardic, diaphoretic, and oriented only to self at this time. MD B. Grandville Silos made aware and new orders for IV ativan recieved. Will continue to monitor.

## 2013-06-15 NOTE — Progress Notes (Signed)
Calmer now. Not continuously thrashing. Moving good air (breathing through nostrils). Vitals stabilizing.

## 2013-06-15 NOTE — Progress Notes (Signed)
1400: Spoke with sister, [name]. Explained situation, stopping narcotics, pt hallucinating, giving ativan and haldol to help relax, planned CT head- unable to go due to restlessness.   Report called to Margaretha Sheffield, [place] Attempted to call sister, [name], to notify of transfer to ICU. No answer, Voicemail full.  [place], family friend, at bedside. Upset, explained to him the current situation and plan to move back to ICU for sedating, to help patient rest; plan to get CT head completed once calm. Daryl stated, "[name] said there is a golf ball knot in his head that needs scanned?" Explained to Daryl reason for CT head is to rule out any other reason for confusion.  Daryl phoned [name], RN explained to [place] plan, again. Daryl left comfortable with conversation, will come back to visit pt in ICU after 6pm. RN gave visitor new room location, 21M02.    Transferred by bed with charge RN, Retail banker. All belongings sent with patient including pt small suitcase. All meds sent. BP stable, continued hallucinations and agitation. Sitter stayed with patient. Precedex infusion sent to 21M for initiation. eICU and CMT notified of transfer.

## 2013-06-15 NOTE — Progress Notes (Addendum)
      [address]       [place] [address]             [phone]       4 Days Post-Op Procedure(s) (LRB): VIDEO BRONCHOSCOPY (N/A) VIDEO ASSISTED THORACOSCOPY (VATS)/THOROCOTOMY (Left)  Subjective: Patient agitated this am but is cooperative  Objective: Vital signs in last 24 hours: Temp:  [97.9 F (36.6 C)-99 F (37.2 C)] 98.4 F (36.9 C) (06/12 0718) Pulse Rate:  [83-114] 83 (06/12 0506) Cardiac Rhythm:  [-] Normal sinus rhythm (06/12 0500) Resp:  [15-33] 18 (06/12 0506) BP: (126-222)/(76-115) 169/89 mmHg (06/12 0718) SpO2:  [89 %-100 %] 91 % (06/12 0506) Weight:  [169 lb 1.5 oz (76.7 kg)-169 lb 5 oz (76.8 kg)] 169 lb 5 oz (76.8 kg) (06/12 0353)     Intake/Output from previous day: 06/11 0701 - 06/12 0700 In: 962 [P.O.:360; I.V.:389] Out: 2297 [Urine:1600; Chest Tube:230]   Physical Exam:  Cardiovascular:Tachcardic Pulmonary: Diminished at left base; no rales, wheezes, or rhonchi. Extremities: No  lower extremity edema. Wounds: Clean and dry.  No erythema or signs of infection. Chest Tubes: no air leak, on suction  Lab Results: CBC: Recent Labs  06/14/13 0630 06/15/13 0405  WBC 12.0* 10.0  HGB 10.4* 10.7*  HCT 31.3* 32.3*  PLT 309 331   BMET:  Recent Labs  06/13/13 0610 06/15/13 0405  NA 140 140  K 4.3 3.2*  CL 103 100  CO2 28 27  GLUCOSE 130* 109*  BUN 17 12  CREATININE 0.53 0.53  CALCIUM 8.4 8.8    PT/INR: No results found for this basename: LABPROT, INR,  in the last 72 hours ABG:  INR: Will add last result for INR, ABG once components are confirmed Will add last 4 CBG results once components are confirmed  Assessment/Plan:  1. CV - ST 2.  Pulmonary - Anterior chest tube removed yesterday.Remaining chest tube with 230 cc. There is no air leak. CXR appears to show no pneumothorax, small left pleural effusion. Hope to remove another chest tube soon 3. Anemia-H and H stable at 10.7 and 32.3 4. Supplement  potassium 5. SCDs for DVT proph-no Lovenox for now  ZIMMERMAN,DONIELLE MPA-C 06/15/2013,7:57 AM Agree with above findings, assessment Patient is delerious today CXR shows better aeration L base Leave both chest tubes for output > 200 cc Cultures from OR of pleural material - no growth

## 2013-06-15 NOTE — Progress Notes (Signed)
Patient ID: James [name], male   DOB: June 21, 1960, 53 y.o.   MRN: [id number]  LOS: 11 days   Subjective: Did well overnight.  Started getting confused, having visual hallucinations around 5AM.  HR 130s, RR 40s.  Given 3mg  haldol, 3mg  ativan.  On Dilaudid PCA.  Last oxycodone dose at 2353.    Objective: Vital signs in last 24 hours: Temp:  [97.9 F (36.6 C)-99 F (37.2 C)] 98.4 F (36.9 C) (06/12 0718) Pulse Rate:  [83-131] 131 (06/12 0800) Resp:  [15-33] 18 (06/12 0506) BP: (126-222)/(76-113) 169/89 mmHg (06/12 0718) SpO2:  [89 %-100 %] 91 % (06/12 0506) Weight:  [169 lb 1.5 oz (76.7 kg)-169 lb 5 oz (76.8 kg)] 169 lb 5 oz (76.8 kg) (06/12 0353) Last BM Date: 06/14/13  Lab Results:  CBC  Recent Labs  06/14/13 0630 06/15/13 0405  WBC 12.0* 10.0  HGB 10.4* 10.7*  HCT 31.3* 32.3*  PLT 309 331   BMET  Recent Labs  06/13/13 0610 06/15/13 0405  NA 140 140  K 4.3 3.2*  CL 103 100  CO2 28 27  GLUCOSE 130* 109*  BUN 17 12  CREATININE 0.53 0.53  CALCIUM 8.4 8.8    Imaging: Dg Chest Port 1 View  06/14/2013   CLINICAL DATA:  Followup thoracotomy  EXAM: PORTABLE CHEST - 1 VIEW  COMPARISON:  06/13/2013  FINDINGS: Allowing for differences in patient positioning, there has been no significant change. 3 chest tubes are noted on the left, stable. There is left lung opacity that extends from the perihilar mid lung to the lung base where it is more confluent. There is at least a small pleural effusion. There is no convincing pneumothorax. Multiple rib fractures on the left are stable.  Right lung is hyperexpanded. There are mildly thickened interstitial markings. No right pleural effusion or pneumothorax.  Cardiac silhouette is mildly enlarged.  Right subclavian central venous line is stable and well positioned.  IMPRESSION: 1. No significant change from the previous exam. 2. No pneumothorax. 3. Left lung opacity, probable small left effusion and volume loss on the left, as well as the 3  left-sided chest tubes, are all stable.   Electronically Signed   By: Lajean Manes M.D.   On: 06/14/2013 07:43    PE: General appearance: mild distress, reaching at objects, having visual hallucinations.  Resp: tachypnea, adequate sats.  coarse.  lt CT in place.  Cardio: regular rate and rhythm, S1, S2 normal, no murmur, click, rub or gallop GI: soft, non-tender; bowel sounds normal; no masses,  no organomegaly Neurologic: oriented to self.      Patient Active Problem List   Diagnosis Date Noted  . Hemothorax on left 06/11/2013  . Acute respiratory failure 06/08/2013  . Altered mental status 06/08/2013  . Pneumonia, organism unspecified 06/08/2013  . Fall from ladder 06/05/2013    Assessment/Plan:  Fall from ladder  Acute respiratory failure/Multiple left sided rib fracture/HPTX -  -S/P VATS, drainage of hemothorax, decortication, repair of lung laceration, resection of LLL wedge---Dr. Prescott Gum 06/11/13  -Left CTs per TCTS  -ON-Q device to be D/C today  Left comminuted clavicle fracture-Dr. Rolena Infante following. Figure 8 strap, outpatient follow up with Dr. Onnie Graham  VTE - SCD's, Lovenox  HTN - hydralazine PRN, scheduled lopressor  FEN - tolerating diet.  Hypokalemia-supplement  ID - off ABX, CXs neg so far  ABL anemia - stable  Hx opioid dependence  Acute delirium-alcohol withdrawal, medication related.  Will obtain CTH to  rule out acute intracranial abnormalities.  CBC and BMP are okay.  Obtain a UA, LFTs, ammonia.  Give ativan per CIWA protocol.  May need percedex or seroquel/clonazepam, however, Im not sure he will be able to take anything PO at present time. DC dilaudid PCA, oxycodone.  Dispo --continue SDU, may need ICU, await CTH, repeat ativan dose   Erby Pian, ANP-BC Pager: [phone] General Trauma PA Pager: [phone]   06/15/2013 8:32 AM

## 2013-06-15 NOTE — Progress Notes (Signed)
Acute delirium unknown etiology.  Will try to stop many or the possible medications that can alter LOC.  This patient has been seen and I agree with the findings and treatment plan.  Kathryne Eriksson. Dahlia Bailiff, MD, [place] [phone] (pager) [phone] (direct pager) Trauma Surgeon

## 2013-06-15 NOTE — Progress Notes (Signed)
2233: Pt having harsh hallucinations, visual and auditory. Climbing out of bed, trashing. Disoriented, tremors, HA.  Drenched in sweats. NP notified and to unit shortly after. RN stayed at bedside to ensure safety.   Waste Dilaudid PCA 6.6mg /40mL with Everlena Cooper, RN in pyxis. PCA DC per order by Estill Bakes, NP.   Staff at pt bedside, pt more relaxed, continue concern for patient removal of chest tube and/or central line. Will continue to monitor and perform CIWA.   CT called for patient, unable to go at this time; would not hold still for scan. Will attempt after next dose of haldol/ativan.

## 2013-06-16 ENCOUNTER — Inpatient Hospital Stay (HOSPITAL_COMMUNITY): Payer: Self-pay

## 2013-06-16 LAB — GLUCOSE, CAPILLARY
GLUCOSE-CAPILLARY: 112 mg/dL — AB (ref 70–99)
GLUCOSE-CAPILLARY: 104 mg/dL — AB (ref 70–99)
GLUCOSE-CAPILLARY: 108 mg/dL — AB (ref 70–99)
Glucose-Capillary: 100 mg/dL — ABNORMAL HIGH (ref 70–99)
Glucose-Capillary: 109 mg/dL — ABNORMAL HIGH (ref 70–99)
Glucose-Capillary: 112 mg/dL — ABNORMAL HIGH (ref 70–99)
Glucose-Capillary: 116 mg/dL — ABNORMAL HIGH (ref 70–99)

## 2013-06-16 MED ORDER — MORPHINE SULFATE 2 MG/ML IJ SOLN
1.0000 mg | INTRAMUSCULAR | Status: DC | PRN
  Administered 2013-06-16 – 2013-06-17 (×3): 2 mg via INTRAVENOUS
  Administered 2013-06-17 (×4): 4 mg via INTRAVENOUS
  Administered 2013-06-18: 2 mg via INTRAVENOUS
  Administered 2013-06-18 – 2013-06-19 (×8): 4 mg via INTRAVENOUS
  Administered 2013-06-20: 2 mg via INTRAVENOUS
  Administered 2013-06-20 – 2013-06-21 (×3): 4 mg via INTRAVENOUS
  Administered 2013-06-21: 2 mg via INTRAVENOUS
  Filled 2013-06-16: qty 2
  Filled 2013-06-16: qty 1
  Filled 2013-06-16: qty 2
  Filled 2013-06-16 (×3): qty 1
  Filled 2013-06-16 (×8): qty 2
  Filled 2013-06-16: qty 1
  Filled 2013-06-16 (×3): qty 2
  Filled 2013-06-16: qty 1
  Filled 2013-06-16 (×2): qty 2

## 2013-06-16 MED ORDER — POTASSIUM CHLORIDE 10 MEQ/50ML IV SOLN
10.0000 meq | INTRAVENOUS | Status: AC
  Administered 2013-06-16 (×3): 10 meq via INTRAVENOUS
  Filled 2013-06-16 (×3): qty 50

## 2013-06-16 NOTE — Progress Notes (Signed)
Order for flutter valve. Pt will not open mouth even after much coaxing.

## 2013-06-16 NOTE — Progress Notes (Signed)
Paged MD to Ask about having the tylenol order changed from PO to IV or PR.

## 2013-06-16 NOTE — Progress Notes (Addendum)
      [address]       [place] [address]             [phone]       5 Days Post-Op Procedure(s) (LRB): VIDEO BRONCHOSCOPY (N/A) VIDEO ASSISTED THORACOSCOPY (VATS)/THOROCOTOMY (Left)  Subjective: Patient agitated but cooperative. On Precedex  Objective: Vital signs in last 24 hours: Temp:  [97.6 F (36.4 C)-99.2 F (37.3 C)] 97.8 F (36.6 C) (06/13 1219) Pulse Rate:  [58-145] 94 (06/13 1300) Cardiac Rhythm:  [-] Normal sinus rhythm (06/13 0800) Resp:  [19-39] 19 (06/13 1300) BP: (113-189)/(62-143) 126/75 mmHg (06/13 1300) SpO2:  [90 %-100 %] 99 % (06/13 1401) Weight:  [156 lb 15.5 oz (71.2 kg)-157 lb 10.1 oz (71.5 kg)] 156 lb 15.5 oz (71.2 kg) (06/13 0500)     Intake/Output from previous day: 06/12 0701 - 06/13 0700 In: 250.7 [I.V.:250.7] Out: 2215 [Urine:1925; Chest Tube:290]   Physical Exam:  Cardiovascular:RRR Pulmonary: Diminished at left base; no rales, wheezes, or rhonchi. Extremities: No lower extremity edema. Wounds: Clean and dry.  No erythema or signs of infection. Chest tube ressing removedhere is mild erythema surrounding second chest tube, minor sero sanguinous drainage from previously removed chest tube. No signs of infection at this time.  Chest Tubes: no air leak, on suction  Lab Results: CBC:  Recent Labs  06/14/13 0630 06/15/13 0405  WBC 12.0* 10.0  HGB 10.4* 10.7*  HCT 31.3* 32.3*  PLT 309 331   BMET:   Recent Labs  06/15/13 0405  NA 140  K 3.2*  CL 100  CO2 27  GLUCOSE 109*  BUN 12  CREATININE 0.53  CALCIUM 8.8    PT/INR: No results found for this basename: LABPROT, INR,  in the last 72 hours ABG:  INR: Will add last result for INR, ABG once components are confirmed Will add last 4 CBG results once components are confirmed  Assessment/Plan:  1. CV - SR.  2.  Pulmonary - Anterior chest tube removed yesterday.Remaining chest tube with 210 cc. There is no air leak. Chest tubes to remain to suction  for now. Will remove when output less than 200 cc for 24 hours. CXR shows worsening left atelectasis, multiple right rib fractures. When able, encourage incentive spirometer and flutter valve. 3. Anemia-Last H and H stable at 10.7 and 32.3 4. SCDs for DVT proph-no Lovenox for now 5. Acute delirium-secondary to ETOH withdrawal.CT head showed no acute abnormality. Per Trauma.  ZIMMERMAN,DONIELLE MPA-C 06/16/2013,2:18 PM  I have seen and examined James [name] and agree with the above assessment  and plan.  Grace Isaac MD Beeper [phone] Office [phone] 06/16/2013 4:11 PM

## 2013-06-16 NOTE — Progress Notes (Signed)
5 Days Post-Op  Subjective: Pt con't with some combativeness.  Still on precidex Cxr stable  Objective: Vital signs in last 24 hours: Temp:  [97.6 F (36.4 C)-99.2 F (37.3 C)] 97.6 F (36.4 C) (06/13 0754) Pulse Rate:  [58-145] 73 (06/13 0800) Resp:  [24-39] 24 (06/13 0800) BP: (113-189)/(62-143) 140/75 mmHg (06/13 0800) SpO2:  [90 %-100 %] 100 % (06/13 0800) Weight:  [156 lb 15.5 oz (71.2 kg)-157 lb 10.1 oz (71.5 kg)] 156 lb 15.5 oz (71.2 kg) (06/13 0500) Last BM Date: 06/15/13  Intake/Output from previous day: 06/12 0701 - 06/13 0700 In: 175.7 [I.V.:175.7] Out: 2215 [Urine:1925; Chest Tube:290] Intake/Output this shift: Total I/O In: 9.6 [I.V.:9.6] Out: -   General appearance: alert and combative Resp: clear to auscultation bilaterally Cardio: regular rate and rhythm, S1, S2 normal, no murmur, click, rub or gallop GI: soft, non-tender; bowel sounds normal; no masses,  no organomegaly L chest wall with erythema surrounding CT tape, no purulence  Lab Results:   Recent Labs  06/14/13 0630 06/15/13 0405  WBC 12.0* 10.0  HGB 10.4* 10.7*  HCT 31.3* 32.3*  PLT 309 331   BMET  Recent Labs  06/15/13 0405  NA 140  K 3.2*  CL 100  CO2 27  GLUCOSE 109*  BUN 12  CREATININE 0.53  CALCIUM 8.8   PT/INR No results found for this basename: LABPROT, INR,  in the last 72 hours ABG No results found for this basename: PHART, PCO2, PO2, HCO3,  in the last 72 hours  Studies/Results: Ct Head Wo Contrast  06/16/2013   CLINICAL DATA:  Acute delirium.  EXAM: CT HEAD WITHOUT CONTRAST  TECHNIQUE: Contiguous axial images were obtained from the base of the skull through the vertex without intravenous contrast.  COMPARISON:  Prior CT from 06/04/2013  FINDINGS: Scattered hypodensity within the periventricular and deep white matter both cerebral hemispheres is present, most compatible with mild chronic microvascular ischemic disease, unchanged. Cerebral volume within normal limits  for patient age.  There is no acute intracranial hemorrhage or infarct. 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 No mass lesion or midline shift. Gray-white matter differentiation is well maintained. Ventricles are normal in size without evidence of hydrocephalus. CSF containing spaces are within normal limits. No extra-axial fluid collection.  The calvarium is intact.  Orbital soft tissues are within normal limits.  The paranasal sinuses are well pneumatized and free of fluid. Minimal scattered opacity noted within the mastoid air cells bilaterally.  Scalp soft tissues are unremarkable.  IMPRESSION: 1. No acute intracranial abnormality. 2. Small bilateral mastoid effusions.   Electronically Signed   By: Jeannine Boga M.D.   On: 06/16/2013 02:23   Dg Chest Port 1 View  06/16/2013   CLINICAL DATA:  VATS  EXAM: PORTABLE CHEST - 1 VIEW  COMPARISON:  Chest x-ray from yesterday  FINDINGS: Unchanged positioning of 2 left-sided chest tubes. Stable positioning of right subclavian central line, tip near the upper SVC.  Stable heart size and upper mediastinal contours.  Worsening aeration on the left, with volume loss. Left-sided pleural fluid or pleural thickening is again noted along the lateral chest wall, where there are multiple displaced rib fractures. No pneumothorax. Well aerated right lung. Additional osseous trauma as described on admission CT, including a comminuted left scapular fracture. There are remote right-sided rib fractures.  IMPRESSION: Worsening atelectasis on the left. No evidence of increasing pleural fluid or air leak.   Electronically Signed  By: Jorje Guild M.D.   On: 06/16/2013 07:46   Dg Chest Port 1 View  06/15/2013   CLINICAL DATA:  Chest trauma  EXAM: PORTABLE CHEST - 1 VIEW  COMPARISON:  June 14, 2013  FINDINGS: One of the chest tubes on the left side has been removed. Two chest tubes remain on the left which are unchanged in  position compared to 1 day prior. There is no demonstrable pneumothorax. There is loculated fluid on the left which appears slightly decreased. There is less consolidation in the left lower lobe compared to 1 day prior. Right lung remains clear. Heart size and pulmonary vascularity are normal. No adenopathy. Rib trauma on the left remains unchanged.  IMPRESSION: No pneumothorax status post removal of one of the chest tubes on the left. There is less left lower lobe consolidation slightly less loculated fluid on the left compared to 1 day prior. Right lung remains clear. No change in cardiac silhouette. There is less volume loss on the left currently compared to 1 day prior.   Electronically Signed   By: Lowella Grip M.D.   On: 06/15/2013 08:25    Anti-infectives: Anti-infectives   Start     Dose/Rate Route Frequency Ordered Stop   06/10/13 2200  vancomycin (VANCOCIN) IVPB 750 mg/150 ml premix  Status:  Discontinued     750 mg 150 mL/hr over 60 Minutes Intravenous Every 8 hours 06/10/13 1421 06/13/13 0934   06/09/13 1330  vancomycin (VANCOCIN) IVPB 750 mg/150 ml premix  Status:  Discontinued     750 mg 150 mL/hr over 60 Minutes Intravenous Every 8 hours 06/09/13 1314 06/10/13 1311   06/07/13 1000  vancomycin (VANCOCIN) IVPB 750 mg/150 ml premix  Status:  Discontinued     750 mg 150 mL/hr over 60 Minutes Intravenous Every 12 hours 06/06/13 2100 06/09/13 1314   06/06/13 2200  piperacillin-tazobactam (ZOSYN) IVPB 3.375 g  Status:  Discontinued     3.375 g 12.5 mL/hr over 240 Minutes Intravenous Every 8 hours 06/06/13 2100 06/13/13 0934   06/06/13 2115  vancomycin (VANCOCIN) IVPB 1000 mg/200 mL premix     1,000 mg 200 mL/hr over 60 Minutes Intravenous  Once 06/06/13 2100 06/06/13 2256      Assessment/Plan: Fall from ladder  Acute respiratory failure/Multiple left sided rib fracture/HPTX -  -S/P VATS, drainage of hemothorax, decortication, repair of lung laceration, resection of LLL  wedge---Dr. Lucianne Lei Trigt 06/11/13  -Left CTs per TCTS  -erythema to L chest wall, WBC improving Left comminuted clavicle fracture-Dr. Rolena Infante following. Figure 8 strap, outpatient follow up with Dr. Onnie Graham  VTE - SCD's, Lovenox  HTN - hydralazine PRN, scheduled lopressor  FEN - tolerating diet.  Hypokalemia-supplement  ID - off ABX, CXs neg so far  ABL anemia - stable  Hx opioid dependence  Acute delirium-alcohol withdrawal, medication related. CTH normal.  Give ativan per CIWA protocol.  May need percedex or seroquel/clonazepam, however, Im not sure he will be able to take anything PO at present time. DC dilaudid PCA, oxycodone.    LOS: 12 days    Rosario Jacks., [facility] 06/16/2013

## 2013-06-17 ENCOUNTER — Inpatient Hospital Stay (HOSPITAL_COMMUNITY): Payer: Self-pay

## 2013-06-17 LAB — GLUCOSE, CAPILLARY
Glucose-Capillary: 107 mg/dL — ABNORMAL HIGH (ref 70–99)
Glucose-Capillary: 117 mg/dL — ABNORMAL HIGH (ref 70–99)
Glucose-Capillary: 118 mg/dL — ABNORMAL HIGH (ref 70–99)
Glucose-Capillary: 121 mg/dL — ABNORMAL HIGH (ref 70–99)
Glucose-Capillary: 122 mg/dL — ABNORMAL HIGH (ref 70–99)
Glucose-Capillary: 130 mg/dL — ABNORMAL HIGH (ref 70–99)

## 2013-06-17 LAB — EXPECTORATED SPUTUM ASSESSMENT W GRAM STAIN, RFLX TO RESP C: Special Requests: NORMAL

## 2013-06-17 MED ORDER — DEXTROSE 5 % IV SOLN
2.0000 g | Freq: Two times a day (BID) | INTRAVENOUS | Status: DC
  Administered 2013-06-17 – 2013-06-22 (×11): 2 g via INTRAVENOUS
  Filled 2013-06-17 (×13): qty 2

## 2013-06-17 MED ORDER — OXYCODONE-ACETAMINOPHEN 5-325 MG PO TABS
1.0000 | ORAL_TABLET | ORAL | Status: DC | PRN
  Administered 2013-06-17 – 2013-06-22 (×20): 2 via ORAL
  Filled 2013-06-17 (×22): qty 2

## 2013-06-17 MED ORDER — ACETYLCYSTEINE 20 % IN SOLN
2.0000 mL | Freq: Three times a day (TID) | RESPIRATORY_TRACT | Status: AC
  Administered 2013-06-17: 4 mL via RESPIRATORY_TRACT
  Administered 2013-06-17: 09:00:00 via RESPIRATORY_TRACT
  Filled 2013-06-17 (×3): qty 4

## 2013-06-17 MED ORDER — VANCOMYCIN HCL IN DEXTROSE 750-5 MG/150ML-% IV SOLN
750.0000 mg | Freq: Three times a day (TID) | INTRAVENOUS | Status: DC
  Administered 2013-06-17 – 2013-06-20 (×10): 750 mg via INTRAVENOUS
  Filled 2013-06-17 (×12): qty 150

## 2013-06-17 NOTE — Progress Notes (Addendum)
      [address]       [place] [address]             [phone]       6 Days Post-Op Procedure(s) (LRB): VIDEO BRONCHOSCOPY (N/A) VIDEO ASSISTED THORACOSCOPY (VATS)/THOROCOTOMY (Left)  Subjective: Patient developed more left sided pain and tachypnea last evening. Sitting in chair this am. He is cooperative again  Objective: Vital signs in last 24 hours: Temp:  [97.1 F (36.2 C)-99.3 F (37.4 C)] 97.1 F (36.2 C) (06/14 0818) Pulse Rate:  [82-269] 98 (06/14 0800) Cardiac Rhythm:  [-] Normal sinus rhythm (06/14 0800) Resp:  [19-36] 34 (06/14 0800) BP: (114-152)/(64-100) 152/90 mmHg (06/14 0800) SpO2:  [41 %-100 %] 92 % (06/14 0858) FiO2 (%):  [36 %] 36 % (06/14 0858) Weight:  [153 lb 10.6 oz (69.7 kg)] 153 lb 10.6 oz (69.7 kg) (06/14 0425)     Intake/Output from previous day: 06/13 0701 - 06/14 0700 In: 2114.4 [I.V.:1964.4; IV Piggyback:150] Out: 2085 [Urine:1925; Chest Tube:160]   Physical Exam:  Cardiovascular: Tachycardic Pulmonary: Diminished on left base; no rales, wheezes, or rhonchi. Extremities: No lower extremity edema. Wounds: Clean and dry.  No erythema or signs of infection.  Chest Tubes: no air leak, on suction  Lab Results: CBC:  Recent Labs  06/15/13 0405  WBC 10.0  HGB 10.7*  HCT 32.3*  PLT 331   BMET:   Recent Labs  06/15/13 0405  NA 140  K 3.2*  CL 100  CO2 27  GLUCOSE 109*  BUN 12  CREATININE 0.53  CALCIUM 8.8    PT/INR: No results found for this basename: LABPROT, INR,  in the last 72 hours ABG:  INR: Will add last result for INR, ABG once components are confirmed Will add last 4 CBG results once components are confirmed  Assessment/Plan:  1. CV - SR.  2.  Pulmonary - On 4 liters of oxygen via [place]. Chest tube with 160 cc last 24 hours. There is no air leak. Chest tubes to remain to suction for now. Home to remove one chest tube in am. CXR shows worsening left opacity, multiple left rib fractures,no  pneumothorax. When able, encourage incentive spirometer and flutter valve 3.ID- Now on Maxipime and Vanco (worsening infiltrate) 3. Anemia-Last H and H stable at 10.7 and 32.3 4. SCDs for DVT proph-no Lovenox for now 5. Acute delirium-secondary to ETOH withdrawal.CT head showed no acute abnormality. On Ativan and Precedex. Per Trauma.  [name],James MPA-C 06/17/2013,9:36 AM  Discussed with trauma, increasing atelectasis left lung, increased pul rx, suctioning and sputum culture, iv antidotic started pending sputum culture results, WBC stable I have seen and examined James [name] and agree with the above assessment  and plan.  Grace Isaac MD Beeper [phone] Office [phone] 06/17/2013 11:33 AM

## 2013-06-17 NOTE — Progress Notes (Signed)
6 Days Post-Op  Subjective: Pt more awake this AM and AOx4. Some tachypnea overnight  Objective: Vital signs in last 24 hours: Temp:  [97.8 F (36.6 C)-99.3 F (37.4 C)] 99.3 F (37.4 C) (06/14 0405) Pulse Rate:  [82-269] 90 (06/14 0700) Resp:  [19-36] 30 (06/14 0700) BP: (114-148)/(64-100) 141/89 mmHg (06/14 0700) SpO2:  [41 %-100 %] 93 % (06/14 0700) Weight:  [153 lb 10.6 oz (69.7 kg)] 153 lb 10.6 oz (69.7 kg) (06/14 0425) Last BM Date: 06/16/13  Intake/Output from previous day: 06/13 0701 - 06/14 0700 In: 2114.4 [I.V.:1964.4; IV Piggyback:150] Out: 2085 [Urine:1925; Chest Tube:160] Intake/Output this shift:    General appearance: alert and cooperative Resp: coarse b/l BS Cardio: regular rate and rhythm, S1, S2 normal, no murmur, click, rub or gallop GI: soft, non-tender; bowel sounds normal; no masses,  no organomegaly  Lab Results:   Recent Labs  06/15/13 0405  WBC 10.0  HGB 10.7*  HCT 32.3*  PLT 331   BMET  Recent Labs  06/15/13 0405  NA 140  K 3.2*  CL 100  CO2 27  GLUCOSE 109*  BUN 12  CREATININE 0.53  CALCIUM 8.8    Studies/Results: Ct Head Wo Contrast  06/16/2013   CLINICAL DATA:  Acute delirium.  EXAM: CT HEAD WITHOUT CONTRAST  TECHNIQUE: Contiguous axial images were obtained from the base of the skull through the vertex without intravenous contrast.  COMPARISON:  Prior CT from 06/04/2013  FINDINGS: Scattered hypodensity within the periventricular and deep white matter both cerebral hemispheres is present, most compatible with mild chronic microvascular ischemic disease, unchanged. Cerebral volume within normal limits for patient age.  There is no acute intracranial hemorrhage or infarct. 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 No mass lesion or midline shift. Gray-white matter differentiation is well maintained. Ventricles are normal in size without evidence of hydrocephalus. CSF containing spaces  are within normal limits. No extra-axial fluid collection.  The calvarium is intact.  Orbital soft tissues are within normal limits.  The paranasal sinuses are well pneumatized and free of fluid. Minimal scattered opacity noted within the mastoid air cells bilaterally.  Scalp soft tissues are unremarkable.  IMPRESSION: 1. No acute intracranial abnormality. 2. Small bilateral mastoid effusions.   Electronically Signed   By: Jeannine Boga M.D.   On: 06/16/2013 02:23   Dg Chest Port 1 View  06/17/2013   CLINICAL DATA:  No breath sounds on left side  EXAM: PORTABLE CHEST - 1 VIEW  COMPARISON:  06/16/2013  FINDINGS: Increasing volume loss with left lower lobe opacity, possibly reflecting a combination of atelectasis and increasing pleural effusion/hemorrhage.  Indwelling left apical and left basilar chest tubes. No pneumothorax is seen.  Leftward mediastinal shift.  Right lung is clear.  Old right posterior 5th and 7th rib fractures. Multiple acute left rib fractures.  Right subclavian venous catheter terminates in the mid SVC.  IMPRESSION: Indwelling left chest tubes.  No pneumothorax is seen.  Increasing opacity in the left hemithorax, left lower lobe predominant, possibly reflecting a combination of atelectasis and increasing pleural effusion/hemorrhage.  Multiple left rib fractures.  Leftward mediastinal shift.   Electronically Signed   By: Julian Hy M.D.   On: 06/17/2013 02:26   Dg Chest Port 1 View  06/16/2013   CLINICAL DATA:  VATS  EXAM: PORTABLE CHEST - 1 VIEW  COMPARISON:  Chest x-ray from yesterday  FINDINGS: Unchanged positioning of 2 left-sided chest tubes. Stable positioning of  right subclavian central line, tip near the upper SVC.  Stable heart size and upper mediastinal contours.  Worsening aeration on the left, with volume loss. Left-sided pleural fluid or pleural thickening is again noted along the lateral chest wall, where there are multiple displaced rib fractures. No  pneumothorax. Well aerated right lung. Additional osseous trauma as described on admission CT, including a comminuted left scapular fracture. There are remote right-sided rib fractures.  IMPRESSION: Worsening atelectasis on the left. No evidence of increasing pleural fluid or air leak.   Electronically Signed   By: Jorje Guild M.D.   On: 06/16/2013 07:46    Anti-infectives: Anti-infectives   Start     Dose/Rate Route Frequency Ordered Stop   06/10/13 2200  vancomycin (VANCOCIN) IVPB 750 mg/150 ml premix  Status:  Discontinued     750 mg 150 mL/hr over 60 Minutes Intravenous Every 8 hours 06/10/13 1421 06/13/13 0934   06/09/13 1330  vancomycin (VANCOCIN) IVPB 750 mg/150 ml premix  Status:  Discontinued     750 mg 150 mL/hr over 60 Minutes Intravenous Every 8 hours 06/09/13 1314 06/10/13 1311   06/07/13 1000  vancomycin (VANCOCIN) IVPB 750 mg/150 ml premix  Status:  Discontinued     750 mg 150 mL/hr over 60 Minutes Intravenous Every 12 hours 06/06/13 2100 06/09/13 1314   06/06/13 2200  piperacillin-tazobactam (ZOSYN) IVPB 3.375 g  Status:  Discontinued     3.375 g 12.5 mL/hr over 240 Minutes Intravenous Every 8 hours 06/06/13 2100 06/13/13 0934   06/06/13 2115  vancomycin (VANCOCIN) IVPB 1000 mg/200 mL premix     1,000 mg 200 mL/hr over 60 Minutes Intravenous  Once 06/06/13 2100 06/06/13 2256      Assessment/Plan: Fall from ladder  Acute respiratory failure/Multiple left sided rib fracture/HPTX -  -S/P VATS, drainage of hemothorax, decortication, repair of lung laceration, resection of LLL wedge---Dr. Lucianne Lei Trigt 06/11/13  -Left CTs per TCTS  L PNA  - CXR with worse infiltrate to L side.  Will start Vanc/Cefepime Left comminuted clavicle fracture-Dr. Rolena Infante following. Figure 8 strap, outpatient follow up with Dr. Onnie Graham  VTE - SCD's, Lovenox  HTN - hydralazine PRN, scheduled lopressor  FEN - tolerating diet.  Hypokalemia-supplement  ID - Vanc/Cefepime for HCAP, sputum pending ABL  anemia - stable  Hx opioid dependence  Acute delirium-resolving, with PRN Ativan and minimal precidex   LOS: 13 days    Rosario Jacks., [facility] 06/17/2013

## 2013-06-17 NOTE — Progress Notes (Signed)
Called Trauma due to pt change.  Pt has inceased agitation and has developed severe on set pain.   Trauma MD, Dr. Brantley Stage returned call and orders were given for morphine 1-4mg  q2h PRN for Pain.

## 2013-06-17 NOTE — Progress Notes (Signed)
Check x-ray was obtained and Cardio Thoracic MD was paged and returned call.   Charge nurse advised to call and notify MD before radiology has read film due to pt's worsening S/S. MD returned call and RN was advised to wait for radiology interpretation.  Continuing to monitor.

## 2013-06-17 NOTE — Progress Notes (Signed)
Pt displays worsening Pain on left side and additional restlessness.   Reassessment was done and pt does not have breath sounds on left.   RT also listened and concluded no breath sounds on left side.   Trauma was paged and Dr. Brantley Stage returned called and was updated on pt status and ordered stat chest x-ray.   I was advised that if pt had a change in status to call Cardio Thoracic due to chest tubes placement on the left via VATS.

## 2013-06-17 NOTE — Progress Notes (Signed)
Pt had bloody nose.  He has been cleaned up and is not complaining of any pain.

## 2013-06-17 NOTE — Progress Notes (Signed)
ANTIBIOTIC CONSULT NOTE - INITIAL  Pharmacy Consult for vancomycin and cefepime Indication: pneumonia  No Known Allergies  Patient Measurements: Height: 5\' 9"  (175.3 cm) Weight: 153 lb 10.6 oz (69.7 kg) IBW/kg (Calculated) : 70.7   Vital Signs: Temp: 97.1 F (36.2 C) (06/14 0818) Temp src: Axillary (06/14 0405) BP: 152/90 mmHg (06/14 0800) Pulse Rate: 98 (06/14 0800) Intake/Output from previous day: 06/13 0701 - 06/14 0700 In: 2114.4 [I.V.:1964.4; IV Piggyback:150] Out: 2085 [Urine:1925; Chest Tube:160] Intake/Output from this shift: Total I/O In: 78.5 [I.V.:78.5] Out: 400 [Urine:400]  Labs:  Recent Labs  06/15/13 0405  WBC 10.0  HGB 10.7*  PLT 331  CREATININE 0.53   Estimated Creatinine Clearance: 106.5 ml/min (by C-G formula based on Cr of 0.53). No results found for this basename: VANCOTROUGH, Corlis Leak, VANCORANDOM, [place], GENTPEAK, GENTRANDOM, TOBRATROUGH, TOBRAPEAK, TOBRARND, AMIKACINPEAK, AMIKACINTROU, AMIKACIN,  in the last 72 hours   Microbiology: Recent Results (from the past 720 hour(s))  MRSA PCR SCREENING     Status: None   Collection Time    06/05/13  1:08 AM      Result Value Ref Range Status   MRSA by PCR NEGATIVE  NEGATIVE Final   Comment:            The GeneXpert MRSA Assay (FDA     approved for NASAL specimens     only), is one component of a     comprehensive MRSA colonization     surveillance program. It is not     intended to diagnose MRSA     infection nor to guide or     monitor treatment for     MRSA infections.  CULTURE, RESPIRATORY (NON-EXPECTORATED)     Status: None   Collection Time    06/06/13  6:21 PM      Result Value Ref Range Status   Specimen Description TRACHEAL ASPIRATE   Final   Special Requests NONE   Final   Gram Stain     Final   Value: RARE WBC PRESENT,BOTH PMN AND MONONUCLEAR     FEW SQUAMOUS EPITHELIAL CELLS PRESENT     ABUNDANT GRAM POSITIVE RODS     FEW GRAM POSITIVE COCCI IN PAIRS     RARE GRAM  NEGATIVE RODS   Culture     Final   Value: Non-Pathogenic Oropharyngeal-type Flora Isolated.     Performed at [facility]   Report Status 06/08/2013 FINAL   Final  CULTURE, BAL-QUANTITATIVE     Status: None   Collection Time    06/07/13 10:05 AM      Result Value Ref Range Status   Specimen Description BRONCHIAL ALVEOLAR LAVAGE   Final   Special Requests NONE   Final   Gram Stain     Final   Value: ABUNDANT WBC PRESENT,BOTH PMN AND MONONUCLEAR     NO SQUAMOUS EPITHELIAL CELLS SEEN     NO ORGANISMS SEEN     Performed at [facility]     Final   Value: 5,000 COLONIES/ML     Performed at [facility]   Culture     Final   Value: Non-Pathogenic Oropharyngeal-type Flora Isolated.     Performed at [facility]   Report Status 06/09/2013 FINAL   Final  CULTURE, RESPIRATORY (NON-EXPECTORATED)     Status: None   Collection Time    06/11/13  8:28 AM      Result Value Ref Range Status  Specimen Description BRONCHIAL WASHINGS   Final   Special Requests PT ON ZUSYN   Final   Gram Stain     Final   Value: FEW WBC PRESENT,BOTH PMN AND MONONUCLEAR     RARE SQUAMOUS EPITHELIAL CELLS PRESENT     NO ORGANISMS SEEN     Performed at [facility]   Culture     Final   Value: NO GROWTH 2 DAYS     Performed at [facility]   Report Status 06/13/2013 FINAL   Final  BODY FLUID CULTURE     Status: None   Collection Time    06/11/13  9:22 AM      Result Value Ref Range Status   Specimen Description PLEURAL FLUID LEFT   Final   Special Requests PT ON ZOSYN   Final   Gram Stain     Final   Value: FEW WBC PRESENT,BOTH PMN AND MONONUCLEAR     NO ORGANISMS SEEN     Performed at [facility]   Culture     Final   Value: NO GROWTH 3 DAYS     Performed at [facility]   Report Status 06/14/2013 FINAL   Final    Medical History: Past Medical History  Diagnosis Date  . Pneumothorax     Medications:  No  prescriptions prior to admission   Assessment: 53 yo man to restart broad spectrum antibiotics for HCAP. He was previously on vancomycin 750 mg IV q8hours.   Goal of Therapy:  Vancomycin trough level 15-20 mcg/ml  Plan:  Vancomycin 750 mg IV q8 hours. Cefepime 2 gm IV q12 hours F/u renal function, cultures and clinical course Vanc trough when appropriate  Thanks for allowing pharmacy to be a part of this patient's care.  Excell Seltzer, PharmD Clinical Pharmacist, [phone] 06/17/2013,8:43 AM

## 2013-06-17 NOTE — Progress Notes (Signed)
Pt refusing NT suction at this time

## 2013-06-17 NOTE — Progress Notes (Signed)
Cardio Thoracic on call MD phoned in new orders.

## 2013-06-18 ENCOUNTER — Inpatient Hospital Stay (HOSPITAL_COMMUNITY): Payer: Self-pay

## 2013-06-18 LAB — BASIC METABOLIC PANEL
BUN: 12 mg/dL (ref 6–23)
CALCIUM: 8.2 mg/dL — AB (ref 8.4–10.5)
CO2: 26 mEq/L (ref 19–32)
CREATININE: 0.56 mg/dL (ref 0.50–1.35)
Chloride: 105 mEq/L (ref 96–112)
GFR calc Af Amer: >90 mL/min (ref >90)
GFR calc non Af Amer: >90 mL/min (ref >90)
GLUCOSE: 108 mg/dL — AB (ref 70–99)
Potassium: 3.9 mEq/L (ref 3.7–5.3)
Sodium: 140 mEq/L (ref 137–147)

## 2013-06-18 LAB — GLUCOSE, CAPILLARY
GLUCOSE-CAPILLARY: 111 mg/dL — AB (ref 70–99)
GLUCOSE-CAPILLARY: 90 mg/dL (ref 70–99)
Glucose-Capillary: 112 mg/dL — ABNORMAL HIGH (ref 70–99)
Glucose-Capillary: 124 mg/dL — ABNORMAL HIGH (ref 70–99)
Glucose-Capillary: 126 mg/dL — ABNORMAL HIGH (ref 70–99)
Glucose-Capillary: 135 mg/dL — ABNORMAL HIGH (ref 70–99)

## 2013-06-18 LAB — CBC
HCT: 31.2 % — ABNORMAL LOW (ref 39.0–52.0)
HEMOGLOBIN: 10.4 g/dL — AB (ref 13.0–17.0)
MCH: 32 pg (ref 26.0–34.0)
MCHC: 33.3 g/dL (ref 30.0–36.0)
MCV: 96 fL (ref 78.0–100.0)
Platelets: 412 10*3/uL — ABNORMAL HIGH (ref 150–400)
RBC: 3.25 MIL/uL — AB (ref 4.22–5.81)
RDW: 13.3 % (ref 11.5–15.5)
WBC: 14.5 10*3/uL — ABNORMAL HIGH (ref 4.0–10.5)

## 2013-06-18 MED ORDER — CALCIUM CARBONATE ANTACID 500 MG PO CHEW
1.0000 | CHEWABLE_TABLET | Freq: Four times a day (QID) | ORAL | Status: DC | PRN
  Administered 2013-06-18 – 2013-06-19 (×2): 200 mg via ORAL
  Filled 2013-06-18 (×2): qty 1

## 2013-06-18 MED ORDER — FOLIC ACID 1 MG PO TABS
1.0000 mg | ORAL_TABLET | Freq: Every day | ORAL | Status: DC
  Administered 2013-06-19 – 2013-06-22 (×4): 1 mg via ORAL
  Filled 2013-06-18 (×4): qty 1

## 2013-06-18 NOTE — Progress Notes (Addendum)
      [address]       [place] [address]             [phone]       7 Days Post-Op Procedure(s) (LRB): VIDEO BRONCHOSCOPY (N/A) VIDEO ASSISTED THORACOSCOPY (VATS)/THOROCOTOMY (Left)  Subjective: Patient more alert this am  Objective: Vital signs in last 24 hours: Temp:  [97.8 F (36.6 C)-100.3 F (37.9 C)] 98.4 F (36.9 C) (06/15 0746) Pulse Rate:  [85-124] 90 (06/15 0753) Cardiac Rhythm:  [-] Normal sinus rhythm (06/14 2000) Resp:  [16-34] 22 (06/15 0753) BP: (95-155)/(55-110) 132/103 mmHg (06/15 0753) SpO2:  [91 %-100 %] 93 % (06/15 0753) FiO2 (%):  [36 %] 36 % (06/14 1408) Weight:  [156 lb 8.4 oz (71 kg)] 156 lb 8.4 oz (71 kg) (06/15 0338)     Intake/Output from previous day: 06/14 0701 - 06/15 0700 In: 3119.4 [P.O.:820; I.V.:1749.4; IV Piggyback:550] Out: 3832 [Urine:1700; Chest Tube:120]   Physical Exam:  Cardiovascular: Tachycardic Pulmonary: Diminished on left base; no rales, wheezes, or rhonchi. Extremities: No lower extremity edema. Wounds: Clean and dry.  No erythema or signs of infection.  Chest Tubes: no air leak, on suction  Lab Results: CBC:  Recent Labs  06/18/13 0500  WBC 14.5*  HGB 10.4*  HCT 31.2*  PLT 412*   BMET:   Recent Labs  06/18/13 0500  NA 140  K 3.9  CL 105  CO2 26  GLUCOSE 108*  BUN 12  CREATININE 0.56  CALCIUM 8.2*    PT/INR: No results found for this basename: LABPROT, INR,  in the last 72 hours ABG:  INR: Will add last result for INR, ABG once components are confirmed Will add last 4 CBG results once components are confirmed  Assessment/Plan:  1. CV - SR.  2.  Pulmonary - On 3 liters of oxygen via [place]. Chest tube with 120 cc last 24 hours. There is no air leak. Chest tubes to remain to suction for now. Home to remove one chest tube today. CXR shows multiple left rib fractures,no pneumothorax, persistent atelectasis and effusion on left. Encourage incentive spirometer and flutter  valve 3.ID- Now on Maxipime and Vanco  3. Anemia-Last H and H stable at 10.4 and 31.2 4. SCDs for DVT proph-no Lovenox for now 5. Acute delirium-secondary to ETOH withdrawal.Continues to improve. On Ativan PRN. Per Trauma.  ZIMMERMAN,DONIELLE MPA-C 06/18/2013,8:43 AM   The patient's atelectasis, and pulmonary status worsened when he developed delirium and was not able to participate in pulmonary toilet etc. Chest x-ray shows mild atelectasis no recurrent effusion Broad-spectrum antibiotics have been started for possible pneumonia Leaving chest tube drains in place until 24 hour output is less than 50 cc Improved nutritional status would be of benefit to this patient's recovery  patient examined and medical record reviewed,agree with above note. VAN TRIGT III,Alexandre Faries 06/18/2013

## 2013-06-18 NOTE — Progress Notes (Signed)
Physical Therapy Treatment Patient Details Name: James [name] MRN: [id number] DOB: 1960-12-01 Today's Date: 06/18/2013    History of Present Illness Patient is a 53 yo male suffered fall from ladder about 6 feet resulting in multiple rib fractures as well as left comminuted clavicle fracture, occult loculated pneumothorax and small left hemothorax. Pt with respiratory decline placed on ventilator support with intubation on 6/4, extubated 6/10.      PT Comments    Pt ambulated 30 ft today with R UE support on RW and min-A for safety/balance +3 for lines/leads/tubes management, assistance with L side of RW, and close chair follow in addition to min-A for safety.  Pt ambulates with very narrow BOS and very small steps bilaterally.  VC provided to stay within RW, avoid WB through L UE, to maintain upright posture, and to increase step size.  Pt also performed sit to/from stand with min-A to power up and +2 for lines/leads/tubes.  Pt's O2 sats remained at or above 93 during ambulation, but dropped to 91 upon rising to stand again after ambulation and seated rest.  Pt was promptly returned to sitting. Pt was kept on 3L O2 [place] throughout therapy today.  Will continue to follow.  Follow Up Recommendations  CIR     Equipment Recommendations  Other (comment) (TBD)    Recommendations for Other Services       Precautions / Restrictions Precautions Precautions: Fall;Other (comment) (NWB L UE with L clavicle fx) Precaution Comments: L clavicle fracture; chest tubes Required Braces or Orthoses: Other Brace/Splint Other Brace/Splint: Sling for L UE Restrictions Weight Bearing Restrictions: Yes LUE Weight Bearing: Non weight bearing (Simultaneous filing. User may not have seen previous data.)    Mobility  Bed Mobility Overal bed mobility: Needs Assistance;+ 2 for safety/equipment Bed Mobility: Supine to Sit     Supine to sit: Mod assist;+2 for safety/equipment     General bed mobility  comments: Mod-A to come up to sitting and slide hips over bed, pt able to assist with bil. LE and R UE.  VC provided for stategy and hand placement.  +2 required to manage lines/leads/tubes.  Transfers Overall transfer level: Needs assistance Equipment used: Rolling walker (2 wheeled) Transfers: Sit to/from Stand Sit to Stand: Min assist;+2 safety/equipment         General transfer comment: Pt able to power up to standing with little physical assist, but min-A for safety and balance.  +2 necessary to manage lines/leads/tubes.  Ambulation/Gait Ambulation/Gait assistance: Min assist;+2 safety/equipment (Plus third person to follow with chair.) Ambulation Distance (Feet): 30 Feet Assistive device: Rolling walker (2 wheeled) Gait Pattern/deviations: Step-through pattern;Decreased stride length;Narrow base of support;Trunk flexed Gait velocity: decreased Gait velocity interpretation: Below normal speed for age/gender General Gait Details: Pt used R UE support on RW with assist to move L side of RW due to NWB status in L UE.  Pt takes very small steps with very narrow BOS.  VC to stay within RW, to maintain upright posture, and to take bigger steps with wider BOS.  Min-A provided for safety and balance.  +2 to manage lines/leads/tubes, assist with RW, and assist pt for balance.  Third person assisted for close chair follow.  Pt took frequenst standing rest breaks, but required heavy VC to stop and sit when tired.  Pt's O2 sats remained at or above 92 throughout gait on 4L O2 [place].   Stairs            Emergency planning/management officer  Modified Rankin (Stroke Patients Only)       Balance Overall balance assessment: Needs assistance Sitting-balance support: Feet unsupported;No upper extremity supported Sitting balance-Leahy Scale: Fair Sitting balance - Comments: Pt able to sit short periods of time with no support while combing his hair, but would not tolerate further challenge to his balance.    Standing balance support: Single extremity supported;During functional activity Standing balance-Leahy Scale: Poor Standing balance comment: Pt needs UE support for safety with balance in standing.                    Cognition Arousal/Alertness: Awake/alert Behavior During Therapy: WFL for tasks assessed/performed Overall Cognitive Status: Within Functional Limits for tasks assessed                 General Comments: Pt participated in pleasant conversation with sense of humor intact this morning.  All responses were appropriate.    Exercises General Exercises - Lower Extremity Ankle Circles/Pumps: AROM;Both;20 reps;Seated Quad Sets: AROM;Both;5 reps;Seated Straight Leg Raises: AROM;Both;10 reps;Seated;Other (comment) (Through partial ROM)    General Comments General comments (skin integrity, edema, etc.): Upon sitting and returning to room after ambulation, pt was SOB.  After rising to stand again for ADL practice, pt's O2 level dropped to 91.  Pt was returned to sitting and his O2 sats went back up to 93.  OK to take pt off of chest tube suction during therapy per RN.      Pertinent Vitals/Pain Pt reported pain in L shoulder/chest during mobility.  Sling donned upon sitting up in bed and used throughout therapy.  RN is aware.    Home Living                      Prior Function            PT Goals (current goals can now be found in the care plan section) Acute Rehab PT Goals Patient Stated Goal: None stated PT Goal Formulation: With patient Time For Goal Achievement: 07/02/13 Potential to Achieve Goals: Good Progress towards PT goals: Progressing toward goals    Frequency  Min 4X/week    PT Plan Current plan remains appropriate    Co-evaluation PT/OT/SLP Co-Evaluation/Treatment: Yes Reason for Co-Treatment: Complexity of the patient's impairments (multi-system involvement) PT goals addressed during session: Mobility/safety with  mobility;Balance;Proper use of DME;Strengthening/ROM       End of Session Equipment Utilized During Treatment: Gait belt;Oxygen Activity Tolerance: Patient limited by fatigue;Patient limited by pain Patient left: in chair;with call bell/phone within reach;with nursing/sitter in room     Time: 7989-2119 PT Time Calculation (min): 46 min  Charges:                       G Codes:      Vamsi Apfel, SPT 06/18/2013, 11:03 AM

## 2013-06-18 NOTE — Progress Notes (Signed)
Rehab admissions - I spoke with patient today at the bedside.  He gave me permission to call his sister.  Patient could potentially admit to inpatient rehab and then discharge home with sister providing supervision.  Sister works but she can take time off.  Once patient medically ready, will determine if he still needs inpatient rehab admission.  He is progressing well and was able to ambulate 30 ft with minimum assistance today.  Call me for questions.  #[phone]

## 2013-06-18 NOTE — Progress Notes (Signed)
Occupational Therapy Treatment Patient Details Name: James [name] MRN: [id number] DOB: 13-Jun-1960 Today's Date: 06/18/2013    History of present illness Patient is a 53 yo male suffered fall from ladder about 6 feet resulting in multiple rib fractures as well as left comminuted clavicle fracture, occult loculated pneumothorax and small left hemothorax. Pt with respiratory decline placed on ventilator support with intubation on 6/4, extubated 6/10.     OT comments  Pt is making progress in OT.  Very motivated.  Follow Up Recommendations  CIR;Supervision - Intermittent    Equipment Recommendations  3 in 1 bedside comode    Recommendations for Other Services Rehab consult    Precautions / Restrictions Precautions Precautions: Fall;Other (comment) Precaution Comments: L clavicle fracture; chest tubes Required Braces or Orthoses: Other Brace/Splint Other Brace/Splint: Sling for L UE Restrictions Weight Bearing Restrictions: Yes LUE Weight Bearing: Non weight bearing       Mobility Bed Mobility Overal bed mobility: Needs Assistance;+ 2 for safety/equipment Bed Mobility: Supine to Sit     Supine to sit: Mod assist;+2 for safety/equipment     General bed mobility comments: assist for trunk with pillow/pad to decrease pressure on ribs  Transfers Overall transfer level: Needs assistance Equipment used: Rolling walker (2 wheeled) Transfers: Sit to/from Stand Sit to Stand: Min assist;+2 safety/equipment         General transfer comment: Pt able to power up to standing with little physical assist, but min-A for safety and balance.  +2 necessary to manage lines/leads/tubes.    Balance Overall balance assessment: Needs assistance Sitting-balance support: Feet unsupported;No upper extremity supported Sitting balance-Leahy Scale: Fair Sitting balance - Comments: Pt able to sit short periods of time with no support while combing his hair, but would not tolerate further  challenge to his balance.   Standing balance support: Single extremity supported Standing balance-Leahy Scale: Poor Standing balance comment: Pt needs UE support for safety with balance in standing.                   ADL       Grooming: Wash/dry face;Brushing hair;Minimal assistance;Standing (for balance).  Pt had matted hair in back:  Combed all but this                                 General ADL Comments: simulated toilet transfer, ambulating with A x 2 due to lines, CT.  Used sling on LUE.  Pt unsteady but no LOB.  Used walker with RUE and therapist pushed L side:  used to support chest tube.  Pt showed signs of muscle fatique but did not want to sit to rest:  encouraged this.        Vision                     Perception     Praxis      Cognition   Behavior During Therapy: [facility] for tasks assessed/performed Overall Cognitive Status: Within Functional Limits for tasks assessed                  General Comments: Pt pleasant and followed all commands.      Extremity/Trunk Assessment               Exercises    Shoulder Instructions       General Comments      Pertinent Vitals/ Pain  Pain in L clavicle with mobility:  Not rated.  Used sling for support  Home Living                                          Prior Functioning/Environment              Frequency Min 2X/week     Progress Toward Goals  OT Goals(current goals can now be found in the care plan section)  Progress towards OT goals: Progressing toward goals  Acute Rehab OT Goals Patient Stated Goal: None stated  Plan Discharge plan remains appropriate    Co-evaluation    PT/OT/SLP Co-Evaluation/Treatment: Yes Reason for Co-Treatment: Complexity of the patient's impairments (multi-system involvement) PT goals addressed during session: Mobility/safety with mobility OT goals addressed during session: ADL's and self-care      End  of Session     Activity Tolerance Patient limited by fatigue   Patient Left in chair;with call bell/phone within reach   Nurse Communication  (RN assisted with chair)        Time:  -     Charges: OT General Charges $OT Visit: 1 Procedure OT Treatments $Self Care/Home Management : 8-22 mins  Shanika Levings 06/18/2013, 11:30 AM Lesle Chris, OTR/L [phone] 06/18/2013

## 2013-06-18 NOTE — Progress Notes (Signed)
Agree with SPT.    Brayten Komar, PT [phone]  

## 2013-06-18 NOTE — Progress Notes (Signed)
Patient ID: James [name], male   DOB: 1960-07-07, 53 y.o.   MRN: [id number] 7 Days Post-Op  Subjective: Able to cough up sputum now, tol PO  Objective: Vital signs in last 24 hours: Temp:  [97.8 F (36.6 C)-100.3 F (37.9 C)] 98.4 F (36.9 C) (06/15 0746) Pulse Rate:  [85-124] 90 (06/15 0753) Resp:  [16-34] 22 (06/15 0753) BP: (95-155)/(55-110) 132/103 mmHg (06/15 0753) SpO2:  [91 %-100 %] 93 % (06/15 0753) FiO2 (%):  [36 %] 36 % (06/14 1408) Weight:  [156 lb 8.4 oz (71 kg)] 156 lb 8.4 oz (71 kg) (06/15 0338) Last BM Date: 06/16/13  Intake/Output from previous day: 06/14 0701 - 06/15 0700 In: 3119.4 [P.O.:820; I.V.:1749.4; IV Piggyback:550] Out: 0962 [Urine:1700; Chest Tube:120] Intake/Output this shift:    General appearance: alert and cooperative Resp: decreased at L base but clear after cough Chest wall: L chest incision Cardio: regular rate and rhythm GI: soft, NT, +BS Neuro: alert, F/C  Lab Results: CBC   Recent Labs  06/18/13 0500  WBC 14.5*  HGB 10.4*  HCT 31.2*  PLT 412*   BMET  Recent Labs  06/18/13 0500  NA 140  K 3.9  CL 105  CO2 26  GLUCOSE 108*  BUN 12  CREATININE 0.56  CALCIUM 8.2*   PT/INR No results found for this basename: LABPROT, INR,  in the last 72 hours ABG No results found for this basename: PHART, PCO2, PO2, HCO3,  in the last 72 hours  Studies/Results: Dg Chest Port 1 View  06/18/2013   CLINICAL DATA:  Chest tube.  EXAM: PORTABLE CHEST - 1 VIEW  COMPARISON:  06/17/2013.  FINDINGS: Two left chest tubes in stable position. PICC line stable position. Persistent atelectasis/ infiltrate and left pleural effusion noted unchanged. No pneumothorax. Right lung is clear. Heart size stable. Multiple left rib fractures again noted. Left scapular fracture cannot be excluded.  IMPRESSION: 1. Line and tube positions stable. Two left chest tubes are in unchanged position. No pneumothorax. 2. Persistent atelectasis and/or infiltrates and  left-sided pleural effusion. 3. Persistent mild left rib fractures. Left scapular fracture cannot be excluded.   Electronically Signed   By: Marcello Moores  Register   On: 06/18/2013 08:00   Dg Chest Port 1 View  06/17/2013   CLINICAL DATA:  No breath sounds on left side  EXAM: PORTABLE CHEST - 1 VIEW  COMPARISON:  06/16/2013  FINDINGS: Increasing volume loss with left lower lobe opacity, possibly reflecting a combination of atelectasis and increasing pleural effusion/hemorrhage.  Indwelling left apical and left basilar chest tubes. No pneumothorax is seen.  Leftward mediastinal shift.  Right lung is clear.  Old right posterior 5th and 7th rib fractures. Multiple acute left rib fractures.  Right subclavian venous catheter terminates in the mid SVC.  IMPRESSION: Indwelling left chest tubes.  No pneumothorax is seen.  Increasing opacity in the left hemithorax, left lower lobe predominant, possibly reflecting a combination of atelectasis and increasing pleural effusion/hemorrhage.  Multiple left rib fractures.  Leftward mediastinal shift.   Electronically Signed   By: Julian Hy M.D.   On: 06/17/2013 02:26    Anti-infectives: Anti-infectives   Start     Dose/Rate Route Frequency Ordered Stop   06/17/13 1000  ceFEPIme (MAXIPIME) 2 g in dextrose 5 % 50 mL IVPB     2 g 100 mL/hr over 30 Minutes Intravenous Every 12 hours 06/17/13 0842     06/17/13 0900  vancomycin (VANCOCIN) IVPB 750 mg/150 ml premix  750 mg 150 mL/hr over 60 Minutes Intravenous Every 8 hours 06/17/13 0842     06/10/13 2200  vancomycin (VANCOCIN) IVPB 750 mg/150 ml premix  Status:  Discontinued     750 mg 150 mL/hr over 60 Minutes Intravenous Every 8 hours 06/10/13 1421 06/13/13 0934   06/09/13 1330  vancomycin (VANCOCIN) IVPB 750 mg/150 ml premix  Status:  Discontinued     750 mg 150 mL/hr over 60 Minutes Intravenous Every 8 hours 06/09/13 1314 06/10/13 1311   06/07/13 1000  vancomycin (VANCOCIN) IVPB 750 mg/150 ml premix  Status:   Discontinued     750 mg 150 mL/hr over 60 Minutes Intravenous Every 12 hours 06/06/13 2100 06/09/13 1314   06/06/13 2200  piperacillin-tazobactam (ZOSYN) IVPB 3.375 g  Status:  Discontinued     3.375 g 12.5 mL/hr over 240 Minutes Intravenous Every 8 hours 06/06/13 2100 06/13/13 0934   06/06/13 2115  vancomycin (VANCOCIN) IVPB 1000 mg/200 mL premix     1,000 mg 200 mL/hr over 60 Minutes Intravenous  Once 06/06/13 2100 06/06/13 2256      Assessment/Plan: Fall from ladder  Acute respiratory failure/Multiple left sided rib fracture/HPTX -  -S/P VATS, drainage of hemothorax, decortication, repair of lung laceration, resection of LLL wedge---Dr. Prescott Gum 06/11/13  -removing a CT today per TCTS  L PNA  - CXR with worse infiltrate to L side.  on Vanc/Cefepime empiric, CX P, pulmonary toilet improving Left comminuted clavicle fracture - Figure 8 strap, outpatient follow up with Dr. Onnie Graham  VTE - SCD's, Lovenox  HTN - hydralazine PRN, scheduled lopressor  FEN - tolerating diet ID - Vanc/Cefepime for HCAP, sputum pending as above ABL anemia - stable  Hx opioid dependence  Acute delirium - much improved Dispo - ICU today for resp monitoring, hope to progress to SDU tomorrow  LOS: 14 days    Georganna Skeans, MD, MPH, FACS Trauma: [phone] General Surgery: [phone]  06/18/2013

## 2013-06-19 ENCOUNTER — Inpatient Hospital Stay (HOSPITAL_COMMUNITY): Payer: Self-pay

## 2013-06-19 LAB — BASIC METABOLIC PANEL
BUN: 11 mg/dL (ref 6–23)
CO2: 28 mEq/L (ref 19–32)
Calcium: 8.7 mg/dL (ref 8.4–10.5)
Chloride: 101 mEq/L (ref 96–112)
Creatinine, Ser: 0.58 mg/dL (ref 0.50–1.35)
GFR calc Af Amer: >90 mL/min (ref >90)
GFR calc non Af Amer: >90 mL/min (ref >90)
GLUCOSE: 111 mg/dL — AB (ref 70–99)
POTASSIUM: 4.4 meq/L (ref 3.7–5.3)
Sodium: 139 mEq/L (ref 137–147)

## 2013-06-19 LAB — CBC
HEMATOCRIT: 30.6 % — AB (ref 39.0–52.0)
HEMOGLOBIN: 10.4 g/dL — AB (ref 13.0–17.0)
MCH: 33.2 pg (ref 26.0–34.0)
MCHC: 34 g/dL (ref 30.0–36.0)
MCV: 97.8 fL (ref 78.0–100.0)
Platelets: 376 10*3/uL (ref 150–400)
RBC: 3.13 MIL/uL — ABNORMAL LOW (ref 4.22–5.81)
RDW: 13.4 % (ref 11.5–15.5)
WBC: 8.9 10*3/uL (ref 4.0–10.5)

## 2013-06-19 LAB — CULTURE, RESPIRATORY: CULTURE: NORMAL

## 2013-06-19 LAB — GLUCOSE, CAPILLARY
GLUCOSE-CAPILLARY: 93 mg/dL (ref 70–99)
Glucose-Capillary: 118 mg/dL — ABNORMAL HIGH (ref 70–99)
Glucose-Capillary: 121 mg/dL — ABNORMAL HIGH (ref 70–99)
Glucose-Capillary: 143 mg/dL — ABNORMAL HIGH (ref 70–99)
Glucose-Capillary: 111 mg/dL — ABNORMAL HIGH (ref 70–99)

## 2013-06-19 LAB — CULTURE, RESPIRATORY W GRAM STAIN

## 2013-06-19 MED ORDER — INSULIN ASPART 100 UNIT/ML ~~LOC~~ SOLN
0.0000 [IU] | Freq: Three times a day (TID) | SUBCUTANEOUS | Status: DC
Start: 2013-06-19 — End: 2013-06-21

## 2013-06-19 MED ORDER — INSULIN ASPART 100 UNIT/ML ~~LOC~~ SOLN: 0.0000 [IU] | Freq: Every day | SUBCUTANEOUS | Status: DC

## 2013-06-19 MED ORDER — ENSURE COMPLETE PO LIQD
237.0000 mL | Freq: Every day | ORAL | Status: DC
  Administered 2013-06-19 – 2013-06-21 (×3): 237 mL via ORAL

## 2013-06-19 MED ORDER — IPRATROPIUM-ALBUTEROL 0.5-2.5 (3) MG/3ML IN SOLN
3.0000 mL | Freq: Three times a day (TID) | RESPIRATORY_TRACT | Status: DC
  Administered 2013-06-19 – 2013-06-21 (×5): 3 mL via RESPIRATORY_TRACT
  Filled 2013-06-19 (×7): qty 3

## 2013-06-19 NOTE — Progress Notes (Signed)
TCTS DAILY ICU PROGRESS NOTE                   [address]            [place] [address]          [phone]   8 Days Post-Op Procedure(s) (LRB): VIDEO BRONCHOSCOPY (N/A) VIDEO ASSISTED THORACOSCOPY (VATS)/THOROCOTOMY (Left)  Total Length of Stay:  LOS: 15 days   Subjective: Feels ok, in good spitits, moderate pain, + cough/sputum production  Objective: Vital signs in last 24 hours: Temp:  [97.8 F (36.6 C)-98.8 F (37.1 C)] 97.8 F (36.6 C) (06/16 0337) Pulse Rate:  [70-112] 86 (06/16 0600) Cardiac Rhythm:  [-] Normal sinus rhythm (06/16 0800) Resp:  [12-24] 24 (06/16 0600) BP: (106-136)/(63-89) 136/87 mmHg (06/16 0600) SpO2:  [90 %-99 %] 95 % (06/16 0803) Weight:  [158 lb 4.6 oz (71.8 kg)] 158 lb 4.6 oz (71.8 kg) (06/16 0337)  Filed Weights   06/17/13 0425 06/18/13 0338 06/19/13 0337  Weight: 153 lb 10.6 oz (69.7 kg) 156 lb 8.4 oz (71 kg) 158 lb 4.6 oz (71.8 kg)    Weight change: 1 lb 12.2 oz (0.8 kg)   Hemodynamic parameters for last 24 hours:    Intake/Output from previous day: 06/15 0701 - 06/16 0700 In: 6063 [P.O.:970; I.V.:1725; IV Piggyback:550] Out: 2075 [Urine:1900; Chest Tube:175]  Intake/Output this shift: Total I/O In: 150 [I.V.:150] Out: -   Current Meds: Scheduled Meds: . antiseptic oral rinse  15 mL Mouth Rinse BID  . ceFEPime (MAXIPIME) IV  2 g Intravenous Q12H  . folic acid  1 mg Oral Daily  . insulin aspart  0-15 Units Subcutaneous TID WC  . insulin aspart  0-5 Units Subcutaneous QHS  . ipratropium-albuterol  3 mL Nebulization Q6H  . polyethylene glycol  17 g Per Tube Daily  . senna-docusate  1 tablet Oral QHS  . tamsulosin  0.4 mg Oral QPC supper  . vancomycin  750 mg Intravenous Q8H   Continuous Infusions: . dexmedetomidine Stopped (06/17/13 2000)  . dextrose 5 % and 0.45% NaCl Stopped (06/15/13 0845)  . dextrose 5 % and 0.9% NaCl 75 mL/hr at 06/18/13 1934   PRN Meds:.bisacodyl, calcium carbonate,  diphenhydrAMINE, haloperidol lactate, hydrALAZINE, ipratropium-albuterol, LORazepam, metoprolol, morphine injection, ondansetron (ZOFRAN) IV, ondansetron (ZOFRAN) IV, ondansetron, oxyCODONE-acetaminophen, potassium chloride  General appearance: alert, cooperative and no distress Heart: regular rate and rhythm Lungs: coarse ronchi- improves with cough, dim in left base Abdomen: benign Extremities: PAS in place Wound: incis healing well  Lab Results: CBC: Recent Labs  06/18/13 0500 06/19/13 0500  WBC 14.5* 8.9  HGB 10.4* 10.4*  HCT 31.2* 30.6*  PLT 412* 376   BMET:  Recent Labs  06/18/13 0500 06/19/13 0500  NA 140 139  K 3.9 4.4  CL 105 101  CO2 26 28  GLUCOSE 108* 111*  BUN 12 11  CREATININE 0.56 0.58  CALCIUM 8.2* 8.7    PT/INR: No results found for this basename: LABPROT, INR,  in the last 72 hours Radiology: Dg Chest Port 1 View  06/19/2013   CLINICAL DATA:  Respiratory failure.  Post thoracotomy.  EXAM: PORTABLE CHEST - 1 VIEW  COMPARISON:  06/18/2013  FINDINGS: Two left chest tubes and right central line are unchanged. No pneumothorax. Left lower lobe atelectasis or infiltrate with small left effusion. Right lung is clear. Multiple left rib fractures again noted. Heart is normal size.  IMPRESSION: No significant change.  No pneumothorax.  Electronically Signed   By: Rolm Baptise M.D.   On: 06/19/2013 08:06   Dg Chest Port 1 View  06/18/2013   CLINICAL DATA:  Chest tube.  EXAM: PORTABLE CHEST - 1 VIEW  COMPARISON:  06/17/2013.  FINDINGS: Two left chest tubes in stable position. PICC line stable position. Persistent atelectasis/ infiltrate and left pleural effusion noted unchanged. No pneumothorax. Right lung is clear. Heart size stable. Multiple left rib fractures again noted. Left scapular fracture cannot be excluded.  IMPRESSION: 1. Line and tube positions stable. Two left chest tubes are in unchanged position. No pneumothorax. 2. Persistent atelectasis and/or infiltrates  and left-sided pleural effusion. 3. Persistent mild left rib fractures. Left scapular fracture cannot be excluded.   Electronically Signed   By: Marcello Moores  Register   On: 06/18/2013 08:00     Assessment/Plan: S/P Procedure(s) (LRB): VIDEO BRONCHOSCOPY (N/A) VIDEO ASSISTED THORACOSCOPY (VATS)/THOROCOTOMY (Left)  1 steady improvement 2 cont to push pulm toilet/ cont current abx 3 keep CT's for now 4 delirium is much improved    GOLD,WAYNE E 06/19/2013 9:24 AM

## 2013-06-19 NOTE — Progress Notes (Signed)
NUTRITION FOLLOW-UP    INTERVENTION: Continue to encourage intake of meals.  Offer Ensure Complete daily   NUTRITION DIAGNOSIS: Inadequate oral intake related to inability to eat as evidenced by NPO status; resolved.   Increased nutrient needs related to infection as evidenced by estimated needs.   Goal: Pt to meet >/=90% of estiamted nutrition needs; met.    Monitor:  PO intake, weight trend, labs   ASSESSMENT: Pt is a 53 y.o male presenting with a fall off of a ladder. Pt found to have multiple rib fractures, left clavicle fracture and pneumothorax and small left hemothorax. Acute respiratory failure/Multiple left sided rib fracture/HPTX -  -S/P VATS, drainage of hemothorax, decortication, repair of lung laceration, resection of LLL wedge---Dr. Prescott Gum 06/11/13   Pt extubated 6/10, pt started on diet 6/11. Per pt his appetite is great and is eating 100% of all meals. Per RN pt eating well.  Pt being treated for PNA.   Height: Ht Readings from Last 1 Encounters:  06/07/13 5' 9" (1.753 m)    Weight: Wt Readings from Last 1 Encounters:  06/19/13 158 lb 4.6 oz (71.8 kg)  Admission weight 151 lb (68.5 kg) 6/2  BMI:  Body mass index is 23.36 kg/(m^2)., Normal   Estimated Nutritional Needs: Kcal: 1800-2000 Protein: 100-110 grams  Fluid: >/= 1.8 L/day   Skin: left chest incision    Diet Order: General Meal Completion: 100%   Intake/Output Summary (Last 24 hours) at 06/19/13 1227 Last data filed at 06/19/13 0800  Gross per 24 hour  Intake   2570 ml  Output   2075 ml  Net    495 ml    Last BM: 6/13  Labs:   Recent Labs Lab 06/15/13 0405 06/18/13 0500 06/19/13 0500  NA 140 140 139  K 3.2* 3.9 4.4  CL 100 105 101  CO2 _0 BUN _1 CREATININE 0.53 0.56 0.58  CALCIUM 8.8 8.2* 8.7  GLUCOSE 109* 108* 111*    CBG (last 3)   Recent Labs  06/18/13 2026 06/18/13 2348 06/19/13 0337  GLUCAP 135* 126* 118*    Scheduled Meds: . antiseptic  oral rinse  15 mL Mouth Rinse BID  . ceFEPime (MAXIPIME) IV  2 g Intravenous Q12H  . folic acid  1 mg Oral Daily  . insulin aspart  0-15 Units Subcutaneous TID WC  . insulin aspart  0-5 Units Subcutaneous QHS  . ipratropium-albuterol  3 mL Nebulization Q6H  . polyethylene glycol  17 g Per Tube Daily  . senna-docusate  1 tablet Oral QHS  . tamsulosin  0.4 mg Oral QPC supper  . vancomycin  750 mg Intravenous Q8H    Continuous Infusions: . dexmedetomidine Stopped (06/17/13 2000)  . dextrose 5 % and 0.45% NaCl Stopped (06/15/13 0845)  . dextrose 5 % and 0.9% NaCl 75 mL/hr at 06/18/13 [place], [place], [employer] Pager [phone] After Hours Pager

## 2013-06-19 NOTE — Progress Notes (Signed)
Pt had a nose bleed from the right nare. Applied pressure to nose and bleeding stopped. Applied humidification to the oxygen to prevent more drying out. Pt doing well.

## 2013-06-19 NOTE — Progress Notes (Signed)
Physical Therapy Treatment Patient Details Name: James [name] MRN: [id number] DOB: 12/03/1960 Today's Date: 06/19/2013    History of Present Illness Patient is a 53 yo male suffered fall from ladder about 6 feet resulting in multiple rib fractures as well as left comminuted clavicle fracture, occult loculated pneumothorax and small left hemothorax. Pt with respiratory decline placed on ventilator support with intubation on 6/4, extubated 6/10.      PT Comments    Patient progressing with ambulation, still having difficulty with bed mobility and transfers secondary to restrictions, pain, and breathing. Will continue to work with patient and progress mobility as tolerated.  Follow Up Recommendations  CIR     Equipment Recommendations  Other (comment) (TBD)    Recommendations for Other Services       Precautions / Restrictions Precautions Precautions: Fall;Other (comment) (NWB L UE with L clavicle fx) Precaution Comments: L clavicle fracture; chest tubes (OK to take off of chest tube suction for therapy per RN.) Required Braces or Orthoses: Other Brace/Splint Other Brace/Splint: Sling for L UE Restrictions Weight Bearing Restrictions: Yes LUE Weight Bearing: Non weight bearing    Mobility  Bed Mobility Overal bed mobility: Needs Assistance;+ 2 for safety/equipment Bed Mobility: Supine to Sit     Supine to sit: Mod assist;+2 for safety/equipment     General bed mobility comments: Assist to come to upright and rotate trunk, pt reports i cant breathe" for brief moment upon transition, but this improved quickly  Transfers Overall transfer level: Needs assistance   Transfers: Sit to/from Stand Sit to Stand: Min assist;+2 safety/equipment         General transfer comment: Pt able to power up to standing with little physical assist, but min-A for safety and balance.  +2 necessary to manage lines/leads/tubes.  Ambulation/Gait Ambulation/Gait assistance: Min  assist Ambulation Distance (Feet): 80 Feet Assistive device:  (pushed WC) Gait Pattern/deviations: Step-through pattern;Decreased stride length;Narrow base of support;Trunk flexed Gait velocity: decreased   General Gait Details: Patient with increased ambulation distance today and 2 standing rest breaks. assist to help stability and VCs for increased cadence   Stairs            Wheelchair Mobility    Modified Rankin (Stroke Patients Only)       Balance     Sitting balance-Leahy Scale: Fair       Standing balance-Leahy Scale: Poor                      Cognition Arousal/Alertness: Awake/alert Behavior During Therapy: WFL for tasks assessed/performed Overall Cognitive Status: Within Functional Limits for tasks assessed                      Exercises General Exercises - Lower Extremity Ankle Circles/Pumps: AROM;Both;20 reps;Seated    General Comments General comments (skin integrity, edema, etc.): pt performed multiple transfers today, during transitional movements patient reports breif period of "inability to breathe" but once transition completes and patient relaxes, this resolves      Pertinent Vitals/Pain 5/10    Home Living                      Prior Function            PT Goals (current goals can now be found in the care plan section) Acute Rehab PT Goals Patient Stated Goal: None stated PT Goal Formulation: With patient Time For Goal Achievement: 07/02/13 Potential to Achieve  Goals: Good Progress towards PT goals: Progressing toward goals    Frequency  Min 4X/week    PT Plan Current plan remains appropriate    Co-evaluation             End of Session Equipment Utilized During Treatment: Gait belt;Oxygen Activity Tolerance: Patient limited by fatigue;Patient limited by pain Patient left: in chair;with call bell/phone within reach;with nursing/sitter in room (in wc to transfer to new unit)     Time:  6378-5885 PT Time Calculation (min): 26 min  Charges:  $Gait Training: 8-22 mins $Therapeutic Activity: 8-22 mins                    G CodesDuncan Dull 04-Jul-2013, 5:09 PM [name], [place] DPT  [phone]

## 2013-06-19 NOTE — H&P (Signed)
Physical Medicine and Rehabilitation Admission H&P    Chief Complaint  Patient presents with  . Left rib fractures with fail chest, Left PTX and HTX s/p VATS, comminuted left clavicle fracture.     HPI:  James [name] is a 53 y.o. right-handed male admitted 06/05/2013 after a fall of approximately 6 feet from a ladder--no  loss of consciousness reported.  Cranial CT and CT cervical spine negative. Other work up revealed left comminuted clavicle fracture, multiple left-sided rib fractures with flail chest, occult loculated pneumothorax and small left hemothorax. He underwent VATS for mini thoracotomy with drainage of hemothorax and decortication of LLL by Dr. Prescott Gum on 06/11/13. s Dr. Rolena Infante recommended conservative care with figure 8 sling and NWB LUE for left clavicle fracture.  Hospital course complicated by respiratory decline requiring intubation from 06/07/2013 to 06/13/2013. He had required in and out caths for  urinary retention. ABLA being monitored with Hgb-10.4. He developed confusion with hallucinations due to ETOH withdrawal on 06/15/13 and placed on precedex and ativan. Follow up CT head without acute abnormality. Antibiotics--Vanc/Cefepime resumed on 06/17/13 due to leucocytosis with fever and worsening of LLL effusion question due to HAP. Last two left chest tubes removed on 06/20/13. Sputum culture with normal flora but follow up CXR with LLE PNA. Patient to continue IS/flutter valve and reports breathing is improving. Mentation has improved but patient deconditioned with balance deficits. Rehab team and MD recommending CIR and patient admitted today.     Review of Systems  Constitutional: Negative for malaise/fatigue.  HENT: Negative for hearing loss.   Eyes: Negative for blurred vision and double vision.  Respiratory: Positive for shortness of breath. Negative for cough and wheezing.   Cardiovascular: Positive for chest pain (left chest  wall pain).  Gastrointestinal:  Negative for heartburn, nausea and vomiting.  Genitourinary: Negative for dysuria and urgency.  Musculoskeletal: Negative for back pain, myalgias and neck pain.  Neurological: Negative for weakness and headaches.  Psychiatric/Behavioral: The patient is not nervous/anxious and does not have insomnia.      Past Medical History  Diagnosis Date  . Pneumothorax    Past Surgical History  Procedure Laterality Date  . Video bronchoscopy N/A 06/11/2013    Procedure: VIDEO BRONCHOSCOPY;  Surgeon: Ivin Poot, MD;  Location: [facility] OR;  Service: Thoracic;  Laterality: N/A;  . Video assisted thoracoscopy (vats)/thorocotomy Left 06/11/2013    Procedure: VIDEO ASSISTED THORACOSCOPY (VATS)/THOROCOTOMY;  Surgeon: Ivin Poot, MD;  Location: [facility] OR;  Service: Thoracic;  Laterality: Left;   Family History  Problem Relation Age of Onset  . 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 Plans on discharge to sister's home. He reports that he has been smoking Cigarettes--1 PPD.  He has been smoking about 0.00 packs per day. He does not have any smokeless tobacco history on file. He reports that he drinks alcohol. He reports that he uses illicit drugs (Marijuana).  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 Status:  Mobility: Bed Mobility  Overal bed mobility: Needs Assistance;+ 2 for safety/equipment Bed Mobility: Supine to Sit Rolling: +2 for physical  assistance;Max assist Supine to sit: Mod assist;+2 for safety/equipment General bed mobility comments: continues to have difficulty with bed mobility, continues to required assist to come to upright and rotate trunk, pt reports i cant breathe" for brief moment upon transition, but this improved quickly Transfers Overall transfer level: Needs assistance Equipment used: 1 person hand held assist Transfers: Sit to/from Stand Sit to Stand: Min assist;+2 safety/equipment Stand pivot transfers: Max assist General transfer comment: Pt able to power up to standing with little physical assist, but min-A for safety and balance.  +2 necessary to manage lines/leads/tubes. Ambulation/Gait Ambulation/Gait assistance: Min guard Ambulation Distance (Feet): 180 Feet Assistive device:  (pushed WC) Gait Pattern/deviations: Step-through pattern;Decreased stride length;Narrow base of support;Trunk flexed Gait velocity: decreased Gait velocity interpretation: Below normal speed for age/gender General Gait Details: Pt required 3 standing rest breaks, SpO2 desaturated to 88% at its lowest on 2 liters supplemental O2. Patient cued for deep inspirations    ADL: ADL Overall ADL's : Needs assistance/impaired Eating/Feeding: Set up;Sitting Grooming: Wash/dry face;Brushing hair;Minimal assistance;Standing Upper Body Bathing: Moderate assistance;Sitting Lower Body Bathing: Maximal assistance;Sitting/lateral leans Upper Body Dressing : Maximal assistance;Sitting Lower Body Dressing: Total assistance;Sit to/from stand Toilet Transfer: Maximal assistance;Stand-pivot Toileting- Clothing Manipulation and Hygiene: Maximal assistance Functional mobility during ADLs: Maximal assistance;+2 for safety/equipment General ADL Comments: simulated toilet transfer, ambulating with A x 2 due to lines, CT.  Used sling on LUE.  Pt unsteady but no LOB.  Used walker with RUE and therapist pushed L side:  used to support chest tube.  Pt  showed signs of muscle fatique but did not want to sit to rest:  encouraged this.    Cognition: Cognition Overall Cognitive Status: Within Functional Limits for tasks assessed Orientation Level: Oriented X4 Cognition Arousal/Alertness: Awake/alert Behavior During Therapy: WFL for tasks assessed/performed Overall Cognitive Status: Within Functional Limits for tasks assessed Area of Impairment: Orientation;Attention;Memory;Following commands;Safety/judgement;Awareness;Problem solving Orientation Level: Disoriented to;Person;Place;Time;Situation Current Attention Level: Focused General Comments: Pt pleasant and followed all commands.    Physical Exam: Blood pressure 143/93, pulse 105, temperature 97.6 F (36.4 C), temperature source Oral, resp. rate 18, height 5\' 9"  (1.753 m), weight 67.949 kg (149 lb 12.8 oz), SpO2 97.00%. Physical Exam Constitutional: He is oriented to person, place, and time. He appears well-developed.  HENT:  Head: Normocephalic.  Eyes: EOM are normal.  Neck: Normal range of motion. Neck supple. No thyromegaly present.  Cardiovascular: Normal rate and regular rhythm.  Respiratory:  Decreased breath sounds at the bases but clear to auscultation, CT in place  GI: Soft. Bowel sounds are normal. He exhibits no distension.  Neurological: He is alert and oriented to person, place, and time. No cranial nerve deficit. Coordination normal.  Moves all 4s but limited by pain especially in UE's. Sensation appears grossly intact.  Skin: Skin is warm and dry.  VATS site clean and dry  Psychiatric: He has a normal mood and affect. His behavior is normal.    Results for orders placed during the hospital encounter of 06/04/13 (from the past 48 hour(s))  VANCOMYCIN, TROUGH     Status: None   Collection Time    06/20/13  3:42 PM      Result Value Ref Range   Vancomycin Tr 12.1  10.0 - 20.0 ug/mL  GLUCOSE, CAPILLARY     Status: Abnormal   Collection Time    06/20/13  5:31 PM  Result Value Ref Range   Glucose-Capillary 112 (*) 70 - 99 mg/dL  GLUCOSE, CAPILLARY     Status: Abnormal   Collection Time    06/20/13  9:45 PM      Result Value Ref Range   Glucose-Capillary 146 (*) 70 - 99 mg/dL   Comment 1 Notify RN    GLUCOSE, CAPILLARY     Status: Abnormal   Collection Time    06/21/13  8:02 AM      Result Value Ref Range   Glucose-Capillary 115 (*) 70 - 99 mg/dL  GLUCOSE, CAPILLARY     Status: Abnormal   Collection Time    06/21/13 12:14 PM      Result Value Ref Range   Glucose-Capillary 126 (*) 70 - 99 mg/dL  GLUCOSE, CAPILLARY     Status: Abnormal   Collection Time    06/21/13  5:32 PM      Result Value Ref Range   Glucose-Capillary 109 (*) 70 - 99 mg/dL  GLUCOSE, CAPILLARY     Status: Abnormal   Collection Time    06/21/13  9:35 PM      Result Value Ref Range   Glucose-Capillary 159 (*) 70 - 99 mg/dL  GLUCOSE, CAPILLARY     Status: Abnormal   Collection Time    06/21/13 10:12 PM      Result Value Ref Range   Glucose-Capillary 131 (*) 70 - 99 mg/dL   Comment 1 Notify RN    GLUCOSE, CAPILLARY     Status: Abnormal   Collection Time    06/22/13  8:06 AM      Result Value Ref Range   Glucose-Capillary 100 (*) 70 - 99 mg/dL  GLUCOSE, CAPILLARY     Status: Abnormal   Collection Time    06/22/13 12:19 PM      Result Value Ref Range   Glucose-Capillary 122 (*) 70 - 99 mg/dL   Dg Chest 2 View  06/22/2013   CLINICAL DATA:  Hydro pneumothorax.  EXAM: CHEST  2 VIEW  COMPARISON:  Chest x-ray 06/21/2013.  FINDINGS: Mediastinum and hilar structures are normal. Left lower lobe infiltrates noted consistent with pneumonia. Left-sided pleural effusion is present. Air-fluid level noted in the left upper chest on prior chest x-ray no longer identified. Left clavicular fracture is again noted. Bilateral rib fractures are present.  IMPRESSION: 1. Left lower lobe infiltrate consistent with pneumonia. Left pleural effusion. Previously identified air-fluid level  left chest no longer identified. 2. Left clavicular and bilateral rib fractures.  No pneumothorax.   Electronically Signed   By: Marcello Moores  Register   On: 06/22/2013 08:47   Dg Chest 2 View  06/21/2013   CLINICAL DATA:  Chest pain  EXAM: CHEST  2 VIEW  COMPARISON:  06/20/2013  FINDINGS: There again noted multiple left rib fractures as well as a left clavicular fracture. There stable in appearance. Chronic changes in the left scapula are seen. There is been removal of left-sided chest tubes when compare with the prior exam. An air-fluid level is noted in the upper left hemi thorax consistent with a mild hydropneumothorax. Persistent increased density in the left base is noted. The right lung is well expanded. Healed rib fractures are noted on the right. The previously seen right-sided central venous line is been removed in the interval.  IMPRESSION: Stable left rib fractures and left clavicular fracture.  Interval chest tube removal on the left.  There is a new air-fluid level in the upper chest  on the left consistent with a small hydropneumothorax.  Persistent left basilar infiltrate   Electronically Signed   By: Inez Catalina M.D.   On: 06/21/2013 08:15       Medical Problem List and Plan: 1. Functional deficits secondary to left clavicle, rib fxs, ptx and flail chest after fall 2.  DVT Prophylaxis/Anticoagulation: Mechanical: Sequential compression devices, below knee Bilateral lower extremities 3. Pain Management: Will continue prn oxycodone.  4. Mood: Stable. Will have LCSW follow for evaluation and support.  5. Neuropsych: This patient is capable of making decisions on her own behalf. 6. ABLA:  Will add iron supplement 7. Pneumothorax s/p VATS: Continue IS with flutter valve. On duonebs tid.  8. Left Clavicle fracture: Is to be NWB LUE with figure 8 splint for support.  9. LLL PNA:  Reactive leucocytosis resolving. Continue Maxipime and Vancomycin for now. Follow up CXR on 06/22 and monitor  WBC/temperature trends.  10 Malnutrition: Add protein supplement.   Post Admission Physician Evaluation: 1. Functional deficits secondary  to polytrauma after fall. 2. Patient is admitted to receive collaborative, interdisciplinary care between the physiatrist, rehab nursing staff, and therapy team. 3. Patient's level of medical complexity and substantial therapy needs in context of that medical necessity cannot be provided at a lesser intensity of care such as a SNF. 4. Patient has experienced substantial functional loss from his/her baseline which was documented above under the "Functional History" and "Functional Status" headings.  Judging by the patient's diagnosis, physical exam, and functional history, the patient has potential for functional progress which will result in measurable gains while on inpatient rehab.  These gains will be of substantial and practical use upon discharge  in facilitating mobility and self-care at the household level. 5. Physiatrist will provide 24 hour management of medical needs as well as oversight of the therapy plan/treatment and provide guidance as appropriate regarding the interaction of the two. 6. 24 hour rehab nursing will assist with bladder management, bowel management, safety, skin/wound care, disease management, medication administration, pain management and patient education  and help integrate therapy concepts, techniques,education, etc. 7. PT will assess and treat for/with: Lower extremity strength, range of motion, stamina, balance, functional mobility, safety, adaptive techniques and equipment, pain mgt, ortho precautions.   Goals are: mod I to supervision. 8. OT will assess and treat for/with: ADL's, functional mobility, safety, upper extremity strength, adaptive techniques and equipment, pain mgt, ortho precautions.   Goals are: supervision to min assist. 9. SLP will assess and treat for/with: n/a.  Goals are: n/a. 10. Case Management and Social  Worker will assess and treat for psychological issues and discharge planning. 11. Team conference will be held weekly to assess progress toward goals and to determine barriers to discharge. 12. Patient will receive at least 3 hours of therapy per day at least 5 days per week. 13. ELOS: 7 days       14. Prognosis:  good     [name], MD, [facility]   06/22/2013

## 2013-06-19 NOTE — Progress Notes (Signed)
Patient ID: CORMAC WINT, male   DOB: 1960-01-10, 53 y.o.   MRN: [id number] 8 Days Post-Op  Subjective: Doing better  Objective: Vital signs in last 24 hours: Temp:  [97.8 F (36.6 C)-98.8 F (37.1 C)] 97.8 F (36.6 C) (06/16 0337) Pulse Rate:  [70-112] 86 (06/16 0600) Resp:  [12-24] 24 (06/16 0600) BP: (106-136)/(63-89) 136/87 mmHg (06/16 0600) SpO2:  [90 %-99 %] 95 % (06/16 0803) Weight:  [158 lb 4.6 oz (71.8 kg)] 158 lb 4.6 oz (71.8 kg) (06/16 0337) Last BM Date: 06/16/13  Intake/Output from previous day: 06/15 0701 - 06/16 0700 In: 3245 [P.O.:970; I.V.:1725; IV Piggyback:550] Out: 2075 [Urine:1900; Chest Tube:175] Intake/Output this shift: Total I/O In: 150 [I.V.:150] Out: -   General appearance: alert and cooperative Resp: clear to auscultation bilaterally Chest wall: left sided chest wall tenderness Cardio: regular rate and rhythm GI: soft, NT Neuro: alert, speech clear, F/C  Lab Results: CBC   Recent Labs  06/18/13 0500 06/19/13 0500  WBC 14.5* 8.9  HGB 10.4* 10.4*  HCT 31.2* 30.6*  PLT 412* 376   BMET  Recent Labs  06/18/13 0500 06/19/13 0500  NA 140 139  K 3.9 4.4  CL 105 101  CO2 26 28  GLUCOSE 108* 111*  BUN 12 11  CREATININE 0.56 0.58  CALCIUM 8.2* 8.7   PT/INR No results found for this basename: LABPROT, INR,  in the last 72 hours ABG No results found for this basename: PHART, PCO2, PO2, HCO3,  in the last 72 hours  Studies/Results: Dg Chest Port 1 View  06/19/2013   CLINICAL DATA:  Respiratory failure.  Post thoracotomy.  EXAM: PORTABLE CHEST - 1 VIEW  COMPARISON:  06/18/2013  FINDINGS: Two left chest tubes and right central line are unchanged. No pneumothorax. Left lower lobe atelectasis or infiltrate with small left effusion. Right lung is clear. Multiple left rib fractures again noted. Heart is normal size.  IMPRESSION: No significant change.  No pneumothorax.   Electronically Signed   By: Rolm Baptise M.D.   On: 06/19/2013 08:06    Dg Chest Port 1 View  06/18/2013   CLINICAL DATA:  Chest tube.  EXAM: PORTABLE CHEST - 1 VIEW  COMPARISON:  06/17/2013.  FINDINGS: Two left chest tubes in stable position. PICC line stable position. Persistent atelectasis/ infiltrate and left pleural effusion noted unchanged. No pneumothorax. Right lung is clear. Heart size stable. Multiple left rib fractures again noted. Left scapular fracture cannot be excluded.  IMPRESSION: 1. Line and tube positions stable. Two left chest tubes are in unchanged position. No pneumothorax. 2. Persistent atelectasis and/or infiltrates and left-sided pleural effusion. 3. Persistent mild left rib fractures. Left scapular fracture cannot be excluded.   Electronically Signed   By: Marcello Moores  Register   On: 06/18/2013 08:00    Anti-infectives: Anti-infectives   Start     Dose/Rate Route Frequency Ordered Stop   06/17/13 1000  ceFEPIme (MAXIPIME) 2 g in dextrose 5 % 50 mL IVPB     2 g 100 mL/hr over 30 Minutes Intravenous Every 12 hours 06/17/13 0842     06/17/13 0900  vancomycin (VANCOCIN) IVPB 750 mg/150 ml premix     750 mg 150 mL/hr over 60 Minutes Intravenous Every 8 hours 06/17/13 0842     06/10/13 2200  vancomycin (VANCOCIN) IVPB 750 mg/150 ml premix  Status:  Discontinued     750 mg 150 mL/hr over 60 Minutes Intravenous Every 8 hours 06/10/13 1421 06/13/13 0934  06/09/13 1330  vancomycin (VANCOCIN) IVPB 750 mg/150 ml premix  Status:  Discontinued     750 mg 150 mL/hr over 60 Minutes Intravenous Every 8 hours 06/09/13 1314 06/10/13 1311   06/07/13 1000  vancomycin (VANCOCIN) IVPB 750 mg/150 ml premix  Status:  Discontinued     750 mg 150 mL/hr over 60 Minutes Intravenous Every 12 hours 06/06/13 2100 06/09/13 1314   06/06/13 2200  piperacillin-tazobactam (ZOSYN) IVPB 3.375 g  Status:  Discontinued     3.375 g 12.5 mL/hr over 240 Minutes Intravenous Every 8 hours 06/06/13 2100 06/13/13 0934   06/06/13 2115  vancomycin (VANCOCIN) IVPB 1000 mg/200 mL premix      1,000 mg 200 mL/hr over 60 Minutes Intravenous  Once 06/06/13 2100 06/06/13 2256      Assessment/Plan: Fall from ladder  Acute respiratory failure/Multiple left sided rib fracture/HPTX -  -S/P VATS, drainage of hemothorax, decortication, repair of lung laceration, resection of LLL wedge---Dr. Lucianne Lei Trigt 06/11/13   L PNA  - CXR with worse infiltrate to L side.  on Vanc/Cefepime empiric, CX P, pulmonary toilet improving, did over 1000 on IS Left comminuted clavicle fracture - Figure 8 strap, outpatient follow up with Dr. Onnie Graham  VTE - SCD's, Lovenox  HTN - hydralazine PRN, scheduled lopressor  FEN - tolerating diet ID - Vanc/Cefepime for HCAP, sputum pending as above ABL anemia - stable  Hx opioid dependence  Acute delirium - much improved Dispo - to 3S  LOS: 15 days    Georganna Skeans, MD, MPH, FACS Trauma: [phone] General Surgery: [phone]  06/19/2013

## 2013-06-20 ENCOUNTER — Inpatient Hospital Stay (HOSPITAL_COMMUNITY): Payer: Self-pay

## 2013-06-20 LAB — VANCOMYCIN, TROUGH: Vancomycin Tr: 12.1 ug/mL (ref 10.0–20.0)

## 2013-06-20 LAB — GLUCOSE, CAPILLARY
GLUCOSE-CAPILLARY: 88 mg/dL (ref 70–99)
Glucose-Capillary: 148 mg/dL — ABNORMAL HIGH (ref 70–99)
Glucose-Capillary: 99 mg/dL (ref 70–99)
Glucose-Capillary: 112 mg/dL — ABNORMAL HIGH (ref 70–99)
Glucose-Capillary: 146 mg/dL — ABNORMAL HIGH (ref 70–99)

## 2013-06-20 MED ORDER — NICOTINE 14 MG/24HR TD PT24: 14.0000 mg | MEDICATED_PATCH | Freq: Every day | TRANSDERMAL | Status: DC

## 2013-06-20 MED ORDER — NICOTINE 14 MG/24HR TD PT24
14.0000 mg | MEDICATED_PATCH | Freq: Every day | TRANSDERMAL | Status: DC
  Administered 2013-06-20 – 2013-06-21 (×2): 14 mg via TRANSDERMAL
  Filled 2013-06-20 (×3): qty 1

## 2013-06-20 MED ORDER — VANCOMYCIN HCL IN DEXTROSE 1-5 GM/200ML-% IV SOLN
1000.0000 mg | Freq: Three times a day (TID) | INTRAVENOUS | Status: DC
  Administered 2013-06-20 – 2013-06-22 (×7): 1000 mg via INTRAVENOUS
  Filled 2013-06-20 (×9): qty 200

## 2013-06-20 NOTE — Progress Notes (Signed)
Physical Therapy Treatment Patient Details Name: James [name] MRN: [id number] DOB: 1960/04/03 Today's Date: 06/20/2013    History of Present Illness Patient is a 53 yo male suffered fall from ladder about 6 feet resulting in multiple rib fractures as well as left comminuted clavicle fracture, occult loculated pneumothorax and small left hemothorax. Pt with respiratory decline placed on ventilator support with intubation on 6/4, extubated 6/10.      PT Comments    Patient improving with activity tolerance but still presents with physical limitations and need for assist secondary to pain and inability to use LUE. Patient educated regarding OOB, increased activity expectations, and deep breathing techniques. Will continue to see and progress as tolerated.   Follow Up Recommendations  CIR     Equipment Recommendations  Other (comment) (TBD)    Recommendations for Other Services       Precautions / Restrictions Precautions Precautions: Fall;Other (comment) (NWB L UE with L clavicle fx) Precaution Comments: L clavicle fracture; chest tubes (OK to take off of chest tube suction for therapy per RN.) Required Braces or Orthoses: Other Brace/Splint Other Brace/Splint: Sling for L UE Restrictions Weight Bearing Restrictions: Yes LUE Weight Bearing: Non weight bearing    Mobility  Bed Mobility Overal bed mobility: Needs Assistance;+ 2 for safety/equipment Bed Mobility: Supine to Sit     Supine to sit: Mod assist;+2 for safety/equipment     General bed mobility comments: continues to have difficulty with bed mobility, continues to required assist to come to upright and rotate trunk, pt reports i cant breathe" for brief moment upon transition, but this improved quickly  Transfers Overall transfer level: Needs assistance Equipment used: 1 person hand held assist Transfers: Sit to/from Stand Sit to Stand: Min assist;+2 safety/equipment         General transfer comment: Pt able  to power up to standing with little physical assist, but min-A for safety and balance.  +2 necessary to manage lines/leads/tubes.  Ambulation/Gait Ambulation/Gait assistance: Min guard Ambulation Distance (Feet): 180 Feet Assistive device:  (pushed WC) Gait Pattern/deviations: Step-through pattern;Decreased stride length;Narrow base of support;Trunk flexed Gait velocity: decreased Gait velocity interpretation: Below normal speed for age/gender General Gait Details: Pt required 3 standing rest breaks, SpO2 desaturated to 88% at its lowest on 2 liters supplemental O2. Patient cued for deep inspirations   Stairs            Wheelchair Mobility    Modified Rankin (Stroke Patients Only)       Balance     Sitting balance-Leahy Scale: Fair       Standing balance-Leahy Scale: Poor                      Cognition Arousal/Alertness: Awake/alert Behavior During Therapy: WFL for tasks assessed/performed Overall Cognitive Status: Within Functional Limits for tasks assessed                      Exercises General Exercises - Lower Extremity Ankle Circles/Pumps: AROM;Both;20 reps;Seated    General Comments General comments (skin integrity, edema, etc.): patient made it half way around the unit with ambulation before sitting in recliner.  Had patient use feet to assist with propelling recliner the remainder of the distance back to the room for LE ther-ex. Patient handled this task well despite increased pain. (pt educated extensively regarding deep breathing)      Pertinent Vitals/Pain 9/10 pain reported (pre medicated), SpO2 on 2 liters during activity at  its lowers 88%, RR 46    Home Living                      Prior Function            PT Goals (current goals can now be found in the care plan section) Acute Rehab PT Goals Patient Stated Goal: None stated PT Goal Formulation: With patient Time For Goal Achievement: 07/02/13 Potential to Achieve  Goals: Good Progress towards PT goals: Progressing toward goals    Frequency  Min 4X/week    PT Plan Current plan remains appropriate    Co-evaluation             End of Session Equipment Utilized During Treatment: Gait belt;Oxygen Activity Tolerance: Patient limited by fatigue;Patient limited by pain Patient left: in chair;with call bell/phone within reach;with nursing/sitter in room     Time: 0825-0851 PT Time Calculation (min): 26 min  Charges:  $Gait Training: 8-22 mins $Therapeutic Activity: 8-22 mins                    G CodesDuncan Dull July 19, 2013, 10:04 AM [name], PT DPT  [phone]

## 2013-06-20 NOTE — Progress Notes (Signed)
ANTIBIOTIC CONSULT NOTE - FOLLOW UP  Pharmacy Consult for vancomycin Indication: pneumonia  No Known Allergies  Patient Measurements: Height: 5\' 9"  (175.3 cm) Weight: 161 lb 2.5 oz (73.1 kg) IBW/kg (Calculated) : 70.7  Vital Signs: Temp: 98.2 F (36.8 C) (06/17 1510) Temp src: Oral (06/17 1510) BP: 120/85 mmHg (06/17 1200) Pulse Rate: 76 (06/17 1200) Intake/Output from previous day: 06/16 0701 - 06/17 0700 In: 2630 [P.O.:480; I.V.:1800; IV Piggyback:350] Out: 1540 [Urine:4450; Chest Tube:122] Intake/Output from this shift: Total I/O In: 550 [I.V.:300; IV Piggyback:250] Out: 1560 [Urine:1500; Chest Tube:60]  Labs:  Recent Labs  06/18/13 0500 06/19/13 0500  WBC 14.5* 8.9  HGB 10.4* 10.4*  PLT 412* 376  CREATININE 0.56 0.58   Estimated Creatinine Clearance: 108 ml/min (by C-G formula based on Cr of 0.58).  Recent Labs  06/20/13 1542  Christiana 12.1     Assessment: 83 YOM s/p S/P VATS, drainage of hemothorax, decortication, repair of lung laceration, on vancomycin/cefepime for HCAP. Afebrile, leukocytosis resolved, renal function stable. Likely transfer to CIR soon.  Vanc 6/3 >>6/10; restarted 6/14>> 6/6 VT < 5, increase to 750mg  q8h  6/17 VT = 12.1 Zosyn 6/3 >>6/10 Cefepime 6/14>>  6/14: Sputum: normal flora 6/8: Pleural fluid: negative 6/4 BAL - negative 6/3 TA - non-pathogenic flora 6/2 MRSA screen eng  Goal of Therapy:  Vancomycin trough level 15-20 mcg/ml  Plan:  - Vanc 750 IV q8h.   - Check today before 1700 dose - Cefepime 2gm IV q12 hours - f/u LOT  Maryanna Shape, PharmD, BCPS  Clinical Pharmacist  Pager: [phone]   06/20/2013,5:11 PM  Addendum: Will increase vancomycin to 1g q 8 hrs. Recheck level at next steady state.  Uvaldo Rising, BCPS  Clinical Pharmacist Pager [phone]  06/20/2013 5:13 PM

## 2013-06-20 NOTE — Progress Notes (Signed)
Rehab admissions - I have no more rehab beds open for today.  I will follow up in am and plan to admit to acute inpatient rehab in am.  Call me for questions.  #[phone]

## 2013-06-20 NOTE — Progress Notes (Signed)
ANTIBIOTIC CONSULT NOTE - FOLLOW UP  Pharmacy Consult for vancomycin Indication: pneumonia  No Known Allergies  Patient Measurements: Height: 5\' 9"  (175.3 cm) Weight: 161 lb 2.5 oz (73.1 kg) IBW/kg (Calculated) : 70.7  Vital Signs: Temp: 98.5 F (36.9 C) (06/17 0710) Temp src: Oral (06/17 0710) BP: 148/87 mmHg (06/17 0800) Pulse Rate: 66 (06/17 0800) Intake/Output from previous day: 06/16 0701 - 06/17 0700 In: 2630 [P.O.:480; I.V.:1800; IV Piggyback:350] Out: 3295 [Urine:4450; Chest Tube:122] Intake/Output from this shift: Total I/O In: 300 [I.V.:150; IV Piggyback:150] Out: 710 [Urine:650; Chest Tube:60]  Labs:  Recent Labs  06/18/13 0500 06/19/13 0500  WBC 14.5* 8.9  HGB 10.4* 10.4*  PLT 412* 376  CREATININE 0.56 0.58   Estimated Creatinine Clearance: 108 ml/min (by C-G formula based on Cr of 0.58). No results found for this basename: VANCOTROUGH, VANCOPEAK, VANCORANDOM, GENTTROUGH, GENTPEAK, GENTRANDOM, TOBRATROUGH, TOBRAPEAK, TOBRARND, AMIKACINPEAK, AMIKACINTROU, AMIKACIN,  in the last 72 hours   Assessment: 25 YOM s/p S/P VATS, drainage of hemothorax, decortication, repair of lung laceration, on vancomycin/cefepime for HCAP. Afebrile, leukocytosis resolved, renal function stable. Likely transfer to CIR soon.  Vanc 6/3 >>6/10; restarted 6/14>> 6/6 VT < 5, increase to 750mg  q8h  Zosyn 6/3 >>6/10 Cefepime 6/14>>  6/14: Sputum: normal flora 6/8: Pleural fluid: negative 6/4 BAL - negative 6/3 TA - non-pathogenic flora 6/2 MRSA screen eng  Goal of Therapy:  Vancomycin trough level 15-20 mcg/ml  Plan:  - Vanc 750 IV q8h.   - Check tomorrow before 1700 dose - Cefepime 2gm IV q12 hours - f/u LOT  Maryanna Shape, PharmD, BCPS  Clinical Pharmacist  Pager: [phone]   06/20/2013,10:29 AM
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
## 2013-06-20 NOTE — Progress Notes (Addendum)
      James [name] [id number]       James [name] [id number]             [phone]      9 Days Post-Op Procedure(s) (LRB): VIDEO BRONCHOSCOPY (N/A) VIDEO ASSISTED THORACOSCOPY (VATS)/THOROCOTOMY (Left)  Subjective:  James [name] has no complaints this morning.  He has a bed available at [facility] and is ready to d/c from trauma standpoint.   Objective: Vital signs in last 24 hours: Temp:  [97.8 F (36.6 C)-98.6 F (37 C)] 98.5 F (36.9 C) (06/17 0710) Pulse Rate:  [66-98] 66 (06/17 0800) Cardiac Rhythm:  [-] Normal sinus rhythm (06/17 0800) Resp:  [11-18] 11 (06/17 0800) BP: (109-151)/(57-88) 148/87 mmHg (06/17 0800) SpO2:  [91 %-94 %] 93 % (06/17 0945) Weight:  [161 lb 2.5 oz (73.1 kg)] 161 lb 2.5 oz (73.1 kg) (06/17 0308)  Intake/Output from previous day: 06/16 0701 - 06/17 0700 In: 2630 [P.O.:480; I.V.:1800; IV Piggyback:350] Out: 0388 [Urine:4450; Chest Tube:122] Intake/Output this shift: Total I/O In: 300 [I.V.:150; IV Piggyback:150] Out: 710 [Urine:650; Chest Tube:60]  General appearance: alert, cooperative and no distress Heart: regular rate and rhythm Lungs: diminished breath sounds left base Wound: clean and dry  Lab Results:  Recent Labs  06/18/13 0500 06/19/13 0500  WBC 14.5* 8.9  HGB 10.4* 10.4*  HCT 31.2* 30.6*  PLT 412* 376   BMET:  Recent Labs  06/18/13 0500 06/19/13 0500  NA 140 139  K 3.9 4.4  CL 105 101  CO2 26 28  GLUCOSE 108* 111*  BUN 12 11  CREATININE 0.56 0.58  CALCIUM 8.2* 8.7    PT/INR: No results found for this basename: LABPROT, INR,  in the last 72 hours ABG    Component Value Date/Time   PHART 7.396 06/12/2013 0404   HCO3 27.8* 06/12/2013 0404   TCO2 29.3 06/12/2013 0404   O2SAT 98.3 06/12/2013 0404   CBG (last 3)   Recent Labs  06/19/13 1635 06/19/13 2158 06/20/13 0713  GLUCAP 111* 148* 99    Assessment/Plan: S/P Procedure(s) (LRB): VIDEO BRONCHOSCOPY (N/A) VIDEO ASSISTED THORACOSCOPY (VATS)/THOROCOTOMY  (Left)  1. Chest tube- 60cc last 4 hours, 2 tubes remain in place, no air leak appreciated, stable appearance of chest tube 2. Pulm- continue pulm toilet 3. Dispo- patient has a bed in CIR and is ready for d/c from trauma standpoint, will discuss chest tube management with staff.  I spoke with CIR admission coordinator who is willing to take patient with a mini express   LOS: 16 days    James [name], [name] 06/20/2013  Chest tube output is minimal. They have been in for 9 days.  I think both of them can be removed and the patient can go to CIR.

## 2013-06-20 NOTE — Progress Notes (Signed)
Rehab admissions - Met with patient this am.  Still with chest tubes.  Patient up in chair doing well.  He is agreeable to inpatient rehab prior to home with sister.  Patient doing well with therapies today.  Once chest tube discontinued or modified, then can consider inpatient rehab admission.  Call me for questions.  #[phone]

## 2013-06-20 NOTE — Progress Notes (Signed)
Patient ID: James [name], male   DOB: [birth year], 53 y.o.   MRN: [id number]  LOS: 16 days   Subjective: Sitting up in a chair.  NAD.  Voiding.  Working on IS.  Tolerating a diet.    Objective: Vital signs in last 24 hours: Temp:  [97.8 F (36.6 C)-98.6 F (37 C)] 98.5 F (36.9 C) (06/17 0710) Pulse Rate:  [66-98] 66 (06/17 0800) Resp:  [11-18] 11 (06/17 0800) BP: (118-151)/(57-88) 148/87 mmHg (06/17 0800) SpO2:  [91 %-94 %] 93 % (06/17 0945) Weight:  [161 lb 2.5 oz (73.1 kg)] 161 lb 2.5 oz (73.1 kg) (06/17 0308) Last BM Date: 06/16/13  Lab Results:  CBC  Recent Labs  06/18/13 0500 06/19/13 0500  WBC 14.5* 8.9  HGB 10.4* 10.4*  HCT 31.2* 30.6*  PLT 412* 376   BMET  Recent Labs  06/18/13 0500 06/19/13 0500  NA 140 139  K 3.9 4.4  CL 105 101  CO2 26 28  GLUCOSE 108* 111*  BUN 12 11  CREATININE 0.56 0.58  CALCIUM 8.2* 8.7    Imaging: Dg Chest Port 1 View  06/20/2013   CLINICAL DATA:  Status post left thoracotomy.  EXAM: PORTABLE CHEST - 1 VIEW  COMPARISON:  June 19, 2013.  FINDINGS: Stable cardiomediastinal silhouette. Right lung is clear. Right subclavian catheter is again noted with tip in expected position of the SVC. Multiple left rib fractures are again noted. Two left-sided chest tubes are again noted which are unchanged in position. No definite pneumothorax is noted. Stable left basilar opacity is noted concerning for pneumonia or atelectasis with small associated pleural effusion.  IMPRESSION: Stable left basilar opacity compared to prior exam. 2 left-sided chest tubes remain without definite evidence of pneumothorax.   Electronically Signed   By: Sabino Dick M.D.   On: 06/20/2013 09:02   Dg Chest Port 1 View  06/19/2013   CLINICAL DATA:  Respiratory failure.  Post thoracotomy.  EXAM: PORTABLE CHEST - 1 VIEW  COMPARISON:  06/18/2013  FINDINGS: Two left chest tubes and right central line are unchanged. No pneumothorax. Left lower lobe atelectasis or infiltrate  with small left effusion. Right lung is clear. Multiple left rib fractures again noted. Heart is normal size.  IMPRESSION: No significant change.  No pneumothorax.   Electronically Signed   By: Rolm Baptise M.D.   On: 06/19/2013 08:06    PE:  General appearance: no acute distress, alert and oriented.  Resp: CTA.  1100 IS.  Left CT in place Cardio: regular rate and rhythm, S1, S2 normal, no murmur, click, rub or gallop  GI: soft, non-tender; bowel sounds normal; no masses, no organomegaly     Patient Active Problem List   Diagnosis Date Noted  . Hemothorax on left 06/11/2013  . Acute respiratory failure 06/08/2013  . Altered mental status 06/08/2013  . Pneumonia, organism unspecified 06/08/2013  . Fall from ladder 06/05/2013    Assessment/Plan:  Fall from ladder  Acute respiratory failure/Multiple left sided rib fracture/HPTX -  -S/P VATS, drainage of hemothorax, decortication, repair of lung laceration, resection of LLL wedge---Dr. Lucianne Lei Trigt 06/11/13  L PNA - CXR stable infiltrate to L side. on Vanc/Cefepime empiric, CX P, pulmonary toilet improving(1129ml IS) Left comminuted clavicle fracture - Figure 8 strap, outpatient follow up with Dr. Onnie Graham  VTE - SCD's, Lovenox  HTN - hydralazine PRN, scheduled lopressor  FEN - tolerating diet  ID - Vanc/Cefepime for HCAP, sputum cx GPC ABL anemia - stable  Hx opioid dependence  Acute delirium - much improved  Dispo - stable for CIR, await TCTS recommendations on CT, if CT remains in place will transfer him to the floor.   Erby Pian, ANP-BC Pager: [phone] General Trauma PA Pager: [phone]   06/20/2013 11:12 AM

## 2013-06-20 NOTE — Progress Notes (Signed)
Up in chair. CT plan per TCTS. CIR once CT out. Patient examined and I agree with the assessment and plan  Georganna Skeans, MD, MPH, FACS Trauma: [phone] General Surgery: [phone]  06/20/2013 11:47 AM

## 2013-06-21 ENCOUNTER — Inpatient Hospital Stay (HOSPITAL_COMMUNITY): Payer: Self-pay

## 2013-06-21 LAB — GLUCOSE, CAPILLARY
GLUCOSE-CAPILLARY: 131 mg/dL — AB (ref 70–99)
Glucose-Capillary: 115 mg/dL — ABNORMAL HIGH (ref 70–99)
Glucose-Capillary: 126 mg/dL — ABNORMAL HIGH (ref 70–99)
Glucose-Capillary: 109 mg/dL — ABNORMAL HIGH (ref 70–99)
Glucose-Capillary: 159 mg/dL — ABNORMAL HIGH (ref 70–99)

## 2013-06-21 MED ORDER — INSULIN ASPART 100 UNIT/ML ~~LOC~~ SOLN: 0.0000 [IU] | Freq: Every day | SUBCUTANEOUS | Status: DC

## 2013-06-21 MED ORDER — IPRATROPIUM-ALBUTEROL 0.5-2.5 (3) MG/3ML IN SOLN: 3.0000 mL | RESPIRATORY_TRACT | Status: DC | PRN

## 2013-06-21 MED ORDER — INSULIN ASPART 100 UNIT/ML ~~LOC~~ SOLN
0.0000 [IU] | Freq: Three times a day (TID) | SUBCUTANEOUS | Status: DC
  Administered 2013-06-21 – 2013-06-22 (×2): 2 [IU] via SUBCUTANEOUS

## 2013-06-21 NOTE — Progress Notes (Signed)
      [address]       [employer] [id number]             [phone]      10 Days Post-Op Procedure(s) (LRB): VIDEO BRONCHOSCOPY (N/A) VIDEO ASSISTED THORACOSCOPY (VATS)/THOROCOTOMY (Left)  Subjective:  James [name] complains of increased pain and shortness of breath since chest tube removal.  Good sats with oxygen via [place]  Objective: Vital signs in last 24 hours: Temp:  [97.5 F (36.4 C)-98.5 F (36.9 C)] 98.5 F (36.9 C) (06/18 0534) Pulse Rate:  [75-114] 75 (06/18 0534) Cardiac Rhythm:  [-] Normal sinus rhythm (06/17 1200) Resp:  [16-20] 20 (06/18 0534) BP: (120-159)/(79-87) 159/87 mmHg (06/18 0534) SpO2:  [92 %-98 %] 98 % (06/18 0534) Weight:  [152 lb 12.8 oz (69.31 kg)] 152 lb 12.8 oz (69.31 kg) (06/18 0534)  Intake/Output from previous day: 06/17 0701 - 06/18 0700 In: 790 [P.O.:240; I.V.:300; IV Piggyback:250] Out: 5852 [Urine:3625; Chest Tube:60] Intake/Output this shift: Total I/O In: -  Out: 400 [Urine:400]  General appearance: alert, cooperative and no distress Heart: regular rate and rhythm Lungs: diminished breath sounds on the left Wound: clean and dry  Lab Results:  Recent Labs  06/19/13 0500  WBC 8.9  HGB 10.4*  HCT 30.6*  PLT 376   BMET:  Recent Labs  06/19/13 0500  NA 139  K 4.4  CL 101  CO2 28  GLUCOSE 111*  BUN 11  CREATININE 0.58  CALCIUM 8.7    PT/INR: No results found for this basename: LABPROT, INR,  in the last 72 hours ABG    Component Value Date/Time   PHART 7.396 06/12/2013 0404   HCO3 27.8* 06/12/2013 0404   TCO2 29.3 06/12/2013 0404   O2SAT 98.3 06/12/2013 0404   CBG (last 3)   Recent Labs  06/20/13 1731 06/20/13 2145 06/21/13 0802  GLUCAP 112* 146* 115*    Assessment/Plan: S/P Procedure(s) (LRB): VIDEO BRONCHOSCOPY (N/A) VIDEO ASSISTED THORACOSCOPY (VATS)/THOROCOTOMY (Left)  1. Chest tubes removed yesterday- patient now with small left hydropneumothorax 2. Dispo- chest tube not indicated at  this time, will repeat 2V CXR in AM, should remain in hospital today   LOS: 17 days    James [name], James [name] 06/21/2013

## 2013-06-21 NOTE — Progress Notes (Signed)
Patient ID: James [name], male   DOB: Oct 01, 1960, 53 y.o.   MRN: [id number]  LOS: 17 days   Subjective: On [place].  Reports increased pain, sob after CT have been removed.  Back from radiology, awaiting CXR results.  Passing flatus, tolerating a diet, voiding.  VSS.  Afebrile.    Objective: Vital signs in last 24 hours: Temp:  [97.5 F (36.4 C)-98.5 F (36.9 C)] 98.5 F (36.9 C) (06/18 0534) Pulse Rate:  [66-114] 75 (06/18 0534) Resp:  [11-20] 20 (06/18 0534) BP: (120-159)/(79-87) 159/87 mmHg (06/18 0534) SpO2:  [92 %-98 %] 98 % (06/18 0534) Weight:  [152 lb 12.8 oz (69.31 kg)] 152 lb 12.8 oz (69.31 kg) (06/18 0534) Last BM Date: 06/18/13  Lab Results:  CBC  Recent Labs  06/19/13 0500  WBC 8.9  HGB 10.4*  HCT 30.6*  PLT 376   BMET  Recent Labs  06/19/13 0500  NA 139  K 4.4  CL 101  CO2 28  GLUCOSE 111*  BUN 11  CREATININE 0.58  CALCIUM 8.7    Imaging: Dg Chest Port 1 View  06/20/2013   CLINICAL DATA:  Status post left thoracotomy.  EXAM: PORTABLE CHEST - 1 VIEW  COMPARISON:  June 19, 2013.  FINDINGS: Stable cardiomediastinal silhouette. Right lung is clear. Right subclavian catheter is again noted with tip in expected position of the SVC. Multiple left rib fractures are again noted. Two left-sided chest tubes are again noted which are unchanged in position. No definite pneumothorax is noted. Stable left basilar opacity is noted concerning for pneumonia or atelectasis with small associated pleural effusion.  IMPRESSION: Stable left basilar opacity compared to prior exam. 2 left-sided chest tubes remain without definite evidence of pneumothorax.   Electronically Signed   By: Sabino Dick M.D.   On: 06/20/2013 09:02   PE:  General appearance: no acute distress, alert and oriented.  Resp: diminished on the left, CTA BUL, RLL. 1100 IS. Cardio: regular rate and rhythm, S1, S2 normal, no murmur, click, rub or gallop  GI: soft, non-tender; bowel sounds normal; no masses, no  organomegaly     Patient Active Problem List   Diagnosis Date Noted  . Hemothorax on left 06/11/2013  . Acute respiratory failure 06/08/2013  . Altered mental status 06/08/2013  . Pneumonia, organism unspecified 06/08/2013  . Fall from ladder 06/05/2013    Assessment/Plan:  Fall from ladder  Acute respiratory failure/Multiple left sided rib fracture/HPTX -  -S/P VATS, drainage of hemothorax, decortication, repair of lung laceration, resection of LLL wedge---Dr. Prescott Gum 06/11/13  -remaining CTs removed, follow up CXR -IS/flutter  valve L PNA - CXR stable infiltrate to L side. on Vanc/Cefepime empiric, CX P D#4/7 Left comminuted clavicle fracture - Figure 8 strap, outpatient follow up with Dr. Onnie Graham  VTE - SCD's, Lovenox  HTN - hydralazine PRN, scheduled lopressor  FEN - tolerating diet  ID - Vanc/Cefepime for HCAP, sputum cx negative  ABL anemia - stable  Hx opioid dependence  Acute delirium - resolved EtOH abuse-CIWA Dispo - stable for CIR   USAA, ANP-BC Pager: [phone] General Trauma PA Pager: [phone]   06/21/2013 7:24 AM

## 2013-06-21 NOTE — Progress Notes (Signed)
Small L hydropneumothorax. F/U CXR in AM. Hope for COR tomorrow. Patient examined and I agree with the assessment and plan  Georganna Skeans, MD, MPH, FACS Trauma: [phone] General Surgery: [phone]  06/21/2013 10:29 AM

## 2013-06-21 NOTE — Progress Notes (Signed)
Rehab admissions - I spoke with trauma PA and will hold on inpatient rehab admission until tomorrow.  Will check progress in am and plan for acute inpatient rehab admission if he is medically stable.  Call me for questions.  #[phone]

## 2013-06-22 ENCOUNTER — Inpatient Hospital Stay (HOSPITAL_COMMUNITY): Payer: Self-pay

## 2013-06-22 ENCOUNTER — Encounter (HOSPITAL_COMMUNITY): Payer: Self-pay | Admitting: Physical Medicine and Rehabilitation

## 2013-06-22 ENCOUNTER — Inpatient Hospital Stay (HOSPITAL_COMMUNITY)
Admission: RE | Admit: 2013-06-22 | Discharge: 2013-06-28 | DRG: 945 | Disposition: A | Discharge status: Home Health | Payer: Self-pay | Source: Intra-hospital | Attending: Physical Medicine & Rehabilitation | Admitting: Physical Medicine & Rehabilitation

## 2013-06-22 DIAGNOSIS — J90 Pleural effusion, not elsewhere classified: Secondary | ICD-10-CM

## 2013-06-22 DIAGNOSIS — S42009A Fracture of unspecified part of unspecified clavicle, initial encounter for closed fracture: Secondary | ICD-10-CM

## 2013-06-22 DIAGNOSIS — W11XXXA Fall on and from ladder, initial encounter: Secondary | ICD-10-CM

## 2013-06-22 DIAGNOSIS — R079 Chest pain, unspecified: Secondary | ICD-10-CM | POA: Diagnosis present

## 2013-06-22 DIAGNOSIS — J9600 Acute respiratory failure, unspecified whether with hypoxia or hypercapnia: Secondary | ICD-10-CM

## 2013-06-22 DIAGNOSIS — F1210 Cannabis abuse, uncomplicated: Secondary | ICD-10-CM | POA: Diagnosis present

## 2013-06-22 DIAGNOSIS — F17200 Nicotine dependence, unspecified, uncomplicated: Secondary | ICD-10-CM | POA: Diagnosis present

## 2013-06-22 DIAGNOSIS — J9383 Other pneumothorax: Secondary | ICD-10-CM | POA: Diagnosis present

## 2013-06-22 DIAGNOSIS — S299XXA Unspecified injury of thorax, initial encounter: Secondary | ICD-10-CM

## 2013-06-22 DIAGNOSIS — J189 Pneumonia, unspecified organism: Secondary | ICD-10-CM | POA: Diagnosis present

## 2013-06-22 DIAGNOSIS — S2239XA Fracture of one rib, unspecified side, initial encounter for closed fracture: Secondary | ICD-10-CM

## 2013-06-22 DIAGNOSIS — S225XXA Flail chest, initial encounter for closed fracture: Secondary | ICD-10-CM

## 2013-06-22 DIAGNOSIS — Z5189 Encounter for other specified aftercare: Principal | ICD-10-CM

## 2013-06-22 DIAGNOSIS — T07XXXA Unspecified multiple injuries, initial encounter: Secondary | ICD-10-CM | POA: Diagnosis present

## 2013-06-22 DIAGNOSIS — D62 Acute posthemorrhagic anemia: Secondary | ICD-10-CM | POA: Diagnosis present

## 2013-06-22 DIAGNOSIS — E46 Unspecified protein-calorie malnutrition: Secondary | ICD-10-CM | POA: Diagnosis present

## 2013-06-22 LAB — GLUCOSE, CAPILLARY
GLUCOSE-CAPILLARY: 122 mg/dL — AB (ref 70–99)
Glucose-Capillary: 100 mg/dL — ABNORMAL HIGH (ref 70–99)
Glucose-Capillary: 119 mg/dL — ABNORMAL HIGH (ref 70–99)

## 2013-06-22 MED ORDER — VANCOMYCIN HCL IN DEXTROSE 1-5 GM/200ML-% IV SOLN: 1000.0000 mg | Freq: Three times a day (TID) | INTRAVENOUS | Status: DC

## 2013-06-22 MED ORDER — SENNOSIDES-DOCUSATE SODIUM 8.6-50 MG PO TABS
2.0000 | ORAL_TABLET | Freq: Every day | ORAL | Status: DC
  Administered 2013-06-22 – 2013-06-26 (×5): 2 via ORAL
  Filled 2013-06-22 (×4): qty 2

## 2013-06-22 MED ORDER — FLEET ENEMA 7-19 GM/118ML RE ENEM: 1.0000 | ENEMA | Freq: Once | RECTAL | Status: AC | PRN

## 2013-06-22 MED ORDER — DIPHENHYDRAMINE HCL 12.5 MG/5ML PO ELIX: 12.5000 mg | ORAL_SOLUTION | Freq: Four times a day (QID) | ORAL | Status: DC | PRN

## 2013-06-22 MED ORDER — IPRATROPIUM-ALBUTEROL 0.5-2.5 (3) MG/3ML IN SOLN: 3.0000 mL | RESPIRATORY_TRACT | Status: DC | PRN

## 2013-06-22 MED ORDER — POLYSACCHARIDE IRON COMPLEX 150 MG PO CAPS
150.0000 mg | ORAL_CAPSULE | Freq: Two times a day (BID) | ORAL | Status: DC
  Administered 2013-06-23 – 2013-06-27 (×10): 150 mg via ORAL
  Filled 2013-06-22 (×12): qty 1

## 2013-06-22 MED ORDER — VANCOMYCIN HCL IN DEXTROSE 1-5 GM/200ML-% IV SOLN
1000.0000 mg | Freq: Three times a day (TID) | INTRAVENOUS | Status: DC
  Administered 2013-06-23 – 2013-06-27 (×12): 1000 mg via INTRAVENOUS
  Filled 2013-06-22 (×15): qty 200

## 2013-06-22 MED ORDER — ACETAMINOPHEN 325 MG PO TABS: 325.0000 mg | ORAL_TABLET | ORAL | Status: DC | PRN

## 2013-06-22 MED ORDER — TAMSULOSIN HCL 0.4 MG PO CAPS
0.4000 mg | ORAL_CAPSULE | Freq: Every day | ORAL | Status: DC
  Administered 2013-06-22 – 2013-06-27 (×6): 0.4 mg via ORAL
  Filled 2013-06-22 (×7): qty 1

## 2013-06-22 MED ORDER — ONDANSETRON HCL 4 MG PO TABS: 4.0000 mg | ORAL_TABLET | Freq: Four times a day (QID) | ORAL | Status: DC | PRN

## 2013-06-22 MED ORDER — BIOTENE DRY MOUTH MT LIQD
15.0000 mL | Freq: Two times a day (BID) | OROMUCOSAL | Status: DC
  Administered 2013-06-23 – 2013-06-27 (×10): 15 mL via OROMUCOSAL

## 2013-06-22 MED ORDER — TRAMADOL HCL 50 MG PO TABS: 50.0000 mg | ORAL_TABLET | Freq: Four times a day (QID) | ORAL | Status: DC | PRN

## 2013-06-22 MED ORDER — POLYETHYLENE GLYCOL 3350 17 G PO PACK
17.0000 g | PACK | Freq: Every day | ORAL | Status: DC
  Administered 2013-06-23 – 2013-06-28 (×6): 17 g
  Filled 2013-06-22 (×7): qty 1

## 2013-06-22 MED ORDER — FOLIC ACID 1 MG PO TABS
1.0000 mg | ORAL_TABLET | Freq: Every day | ORAL | Status: DC
  Administered 2013-06-23 – 2013-06-28 (×6): 1 mg via ORAL
  Filled 2013-06-22 (×7): qty 1

## 2013-06-22 MED ORDER — METHOCARBAMOL 500 MG PO TABS
500.0000 mg | ORAL_TABLET | Freq: Four times a day (QID) | ORAL | Status: DC | PRN
Start: 2013-06-22 — End: 2013-06-28
  Administered 2013-06-22 – 2013-06-25 (×2): 500 mg via ORAL
  Filled 2013-06-22 (×2): qty 1

## 2013-06-22 MED ORDER — TRAZODONE HCL 50 MG PO TABS: 25.0000 mg | ORAL_TABLET | Freq: Every evening | ORAL | Status: DC | PRN

## 2013-06-22 MED ORDER — PRO-STAT SUGAR FREE PO LIQD
30.0000 mL | Freq: Three times a day (TID) | ORAL | Status: DC
  Administered 2013-06-23 – 2013-06-24 (×4): 30 mL via ORAL
  Administered 2013-06-24: 08:00:00 via ORAL
  Administered 2013-06-24 – 2013-06-25 (×2): 30 mL via ORAL
  Administered 2013-06-25: 17:00:00 via ORAL
  Administered 2013-06-25 – 2013-06-28 (×8): 30 mL via ORAL
  Filled 2013-06-22 (×19): qty 30

## 2013-06-22 MED ORDER — OXYCODONE HCL 5 MG PO TABS
15.0000 mg | ORAL_TABLET | ORAL | Status: DC | PRN
  Administered 2013-06-22 – 2013-06-28 (×32): 15 mg via ORAL
  Filled 2013-06-22 (×32): qty 3

## 2013-06-22 MED ORDER — GUAIFENESIN-DM 100-10 MG/5ML PO SYRP: 5.0000 mL | ORAL_SOLUTION | Freq: Four times a day (QID) | ORAL | Status: DC | PRN

## 2013-06-22 MED ORDER — ENSURE COMPLETE PO LIQD
237.0000 mL | Freq: Every day | ORAL | Status: DC
  Administered 2013-06-23 – 2013-06-27 (×5): 237 mL via ORAL

## 2013-06-22 MED ORDER — VANCOMYCIN HCL IN DEXTROSE 1-5 GM/200ML-% IV SOLN
1000.0000 mg | Freq: Three times a day (TID) | INTRAVENOUS | Status: DC
  Filled 2013-06-22 (×2): qty 200

## 2013-06-22 MED ORDER — DEXTROSE 5 % IV SOLN
2.0000 g | Freq: Two times a day (BID) | INTRAVENOUS | Status: DC
  Administered 2013-06-22 – 2013-06-26 (×9): 2 g via INTRAVENOUS
  Filled 2013-06-22 (×12): qty 2

## 2013-06-22 MED ORDER — ALUM & MAG HYDROXIDE-SIMETH 200-200-20 MG/5ML PO SUSP: 30.0000 mL | ORAL | Status: DC | PRN

## 2013-06-22 MED ORDER — NICOTINE 14 MG/24HR TD PT24
14.0000 mg | MEDICATED_PATCH | Freq: Every day | TRANSDERMAL | Status: DC
  Administered 2013-06-22 – 2013-06-27 (×6): 14 mg via TRANSDERMAL
  Filled 2013-06-22 (×7): qty 1

## 2013-06-22 MED ORDER — BISACODYL 10 MG RE SUPP: 10.0000 mg | Freq: Every day | RECTAL | Status: DC | PRN

## 2013-06-22 MED ORDER — ONDANSETRON HCL 4 MG/2ML IJ SOLN: 4.0000 mg | Freq: Four times a day (QID) | INTRAMUSCULAR | Status: DC | PRN

## 2013-06-22 NOTE — Interval H&P Note (Signed)
CAMERYN CHRISLEY was admitted today to Inpatient Rehabilitation with the diagnosis of polytrauma after fall.  The patient's history has been reviewed, patient examined, and there is no change in status.  Patient continues to be appropriate for intensive inpatient rehabilitation.  I have reviewed the patient's chart and labs.  Questions were answered to the patient's satisfaction.  SWARTZ,ZACHARY T 06/22/2013, 9:18 PM

## 2013-06-22 NOTE — Progress Notes (Signed)
CXR a bit better. Coughing up less sputum.Hopefully can go to CIR today. Patient examined and I agree with the assessment and plan  Georganna Skeans, MD, MPH, FACS Trauma: [phone] General Surgery: [phone]  06/22/2013 9:52 AM

## 2013-06-22 NOTE — Progress Notes (Signed)
Report called to Maudry Mayhew, RN on 4W. Transferred patient to [phone] in good condition.

## 2013-06-22 NOTE — Progress Notes (Signed)
Rehab admissions - We received medical clearance from [facility], trauma PA and bed is available in inpatient rehab. Completed admission paperwork with pt and spoke with pt's sister [name] to share update. She was pleased that her brother will be going to rehab today.  I updated Sharyn Lull, trauma case Freight forwarder and Denyse Amass with Education officer, museum and RN that we will be admitting pt to CIR later today. Please call me with any questions.   Thanks.  Nanetta Batty, PT Rehabilitation Admissions Coordinator [phone]

## 2013-06-22 NOTE — Discharge Summary (Signed)
Burke Thompson, MD, MPH, FACS Trauma: [phone] General Surgery: [phone]  

## 2013-06-22 NOTE — H&P (View-Only) (Signed)
Physical Medicine and Rehabilitation Admission H&P    Chief Complaint  Patient presents with  . Left rib fractures with fail chest, Left PTX and HTX s/p VATS, comminuted left clavicle fracture.     HPI:  James [name] is a 53 y.o. right-handed male admitted 06/05/2013 after a fall of approximately 6 feet from a ladder--no  loss of consciousness reported.  Cranial CT and CT cervical spine negative. Other work up revealed left comminuted clavicle fracture, multiple left-sided rib fractures with flail chest, occult loculated pneumothorax and small left hemothorax. He underwent VATS for mini thoracotomy with drainage of hemothorax and decortication of LLL by Dr. Prescott Gum on 06/11/13. s Dr. Rolena Infante recommended conservative care with figure 8 sling and NWB LUE for left clavicle fracture.  Hospital course complicated by respiratory decline requiring intubation from 06/07/2013 to 06/13/2013. He had required in and out caths for  urinary retention. ABLA being monitored with Hgb-10.4. He developed confusion with hallucinations due to ETOH withdrawal on 06/15/13 and placed on precedex and ativan. Follow up CT head without acute abnormality. Antibiotics--Vanc/Cefepime resumed on 06/17/13 due to leucocytosis with fever and worsening of LLL effusion question due to HAP. Last two left chest tubes removed on 06/20/13. Sputum culture with normal flora but follow up CXR with LLE PNA. Patient to continue IS/flutter valve and reports breathing is improving. Mentation has improved but patient deconditioned with balance deficits. Rehab team and MD recommending CIR and patient admitted today.     Review of Systems  Constitutional: Negative for malaise/fatigue.  HENT: Negative for hearing loss.   Eyes: Negative for blurred vision and double vision.  Respiratory: Positive for shortness of breath. Negative for cough and wheezing.   Cardiovascular: Positive for chest pain (left chest  wall pain).  Gastrointestinal:  Negative for heartburn, nausea and vomiting.  Genitourinary: Negative for dysuria and urgency.  Musculoskeletal: Negative for back pain, myalgias and neck pain.  Neurological: Negative for weakness and headaches.  Psychiatric/Behavioral: The patient is not nervous/anxious and does not have insomnia.      Past Medical History  Diagnosis Date  . Pneumothorax    Past Surgical History  Procedure Laterality Date  . Video bronchoscopy N/A 06/11/2013    Procedure: VIDEO BRONCHOSCOPY;  Surgeon: Ivin Poot, MD;  Location: [facility] OR;  Service: Thoracic;  Laterality: N/A;  . Video assisted thoracoscopy (vats)/thorocotomy Left 06/11/2013    Procedure: VIDEO ASSISTED THORACOSCOPY (VATS)/THOROCOTOMY;  Surgeon: Ivin Poot, MD;  Location: [facility] OR;  Service: Thoracic;  Laterality: Left;   Family History  Problem Relation Age of Onset  . 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 Plans on discharge to sister's home. He reports that he has been smoking Cigarettes--1 PPD.  He has been smoking about 0.00 packs per day. He does not have any smokeless tobacco history on file. He reports that he drinks alcohol. He reports that he uses illicit drugs (Marijuana).  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 Status:  Mobility: Bed Mobility  Overal bed mobility: Needs Assistance;+ 2 for safety/equipment Bed Mobility: Supine to Sit Rolling: +2 for physical  assistance;Max assist Supine to sit: Mod assist;+2 for safety/equipment General bed mobility comments: continues to have difficulty with bed mobility, continues to required assist to come to upright and rotate trunk, pt reports i cant breathe" for brief moment upon transition, but this improved quickly Transfers Overall transfer level: Needs assistance Equipment used: 1 person hand held assist Transfers: Sit to/from Stand Sit to Stand: Min assist;+2 safety/equipment Stand pivot transfers: Max assist General transfer comment: Pt able to power up to standing with little physical assist, but min-A for safety and balance.  +2 necessary to manage lines/leads/tubes. Ambulation/Gait Ambulation/Gait assistance: Min guard Ambulation Distance (Feet): 180 Feet Assistive device:  (pushed WC) Gait Pattern/deviations: Step-through pattern;Decreased stride length;Narrow base of support;Trunk flexed Gait velocity: decreased Gait velocity interpretation: Below normal speed for age/gender General Gait Details: Pt required 3 standing rest breaks, SpO2 desaturated to 88% at its lowest on 2 liters supplemental O2. Patient cued for deep inspirations    ADL: ADL Overall ADL's : Needs assistance/impaired Eating/Feeding: Set up;Sitting Grooming: Wash/dry face;Brushing hair;Minimal assistance;Standing Upper Body Bathing: Moderate assistance;Sitting Lower Body Bathing: Maximal assistance;Sitting/lateral leans Upper Body Dressing : Maximal assistance;Sitting Lower Body Dressing: Total assistance;Sit to/from stand Toilet Transfer: Maximal assistance;Stand-pivot Toileting- Clothing Manipulation and Hygiene: Maximal assistance Functional mobility during ADLs: Maximal assistance;+2 for safety/equipment General ADL Comments: simulated toilet transfer, ambulating with A x 2 due to lines, CT.  Used sling on LUE.  Pt unsteady but no LOB.  Used walker with RUE and therapist pushed L side:  used to support chest tube.  Pt  showed signs of muscle fatique but did not want to sit to rest:  encouraged this.    Cognition: Cognition Overall Cognitive Status: Within Functional Limits for tasks assessed Orientation Level: Oriented X4 Cognition Arousal/Alertness: Awake/alert Behavior During Therapy: WFL for tasks assessed/performed Overall Cognitive Status: Within Functional Limits for tasks assessed Area of Impairment: Orientation;Attention;Memory;Following commands;Safety/judgement;Awareness;Problem solving Orientation Level: Disoriented to;Person;Place;Time;Situation Current Attention Level: Focused General Comments: Pt pleasant and followed all commands.    Physical Exam: Blood pressure 143/93, pulse 105, temperature 97.6 F (36.4 C), temperature source Oral, resp. rate 18, height 5\' 9"  (1.753 m), weight 67.949 kg (149 lb 12.8 oz), SpO2 97.00%. Physical Exam Constitutional: He is oriented to person, place, and time. He appears well-developed.  HENT:  Head: Normocephalic.  Eyes: EOM are normal.  Neck: Normal range of motion. Neck supple. No thyromegaly present.  Cardiovascular: Normal rate and regular rhythm.  Respiratory:  Decreased breath sounds at the bases but clear to auscultation, CT in place  GI: Soft. Bowel sounds are normal. He exhibits no distension.  Neurological: He is alert and oriented to person, place, and time. No cranial nerve deficit. Coordination normal.  Moves all 4s but limited by pain especially in UE's. Sensation appears grossly intact.  Skin: Skin is warm and dry.  VATS site clean and dry  Psychiatric: He has a normal mood and affect. His behavior is normal.    Results for orders placed during the hospital encounter of 06/04/13 (from the past 48 hour(s))  VANCOMYCIN, TROUGH     Status: None   Collection Time    06/20/13  3:42 PM      Result Value Ref Range   Vancomycin Tr 12.1  10.0 - 20.0 ug/mL  GLUCOSE, CAPILLARY     Status: Abnormal   Collection Time    06/20/13  5:31 PM  Result Value Ref Range   Glucose-Capillary 112 (*) 70 - 99 mg/dL  GLUCOSE, CAPILLARY     Status: Abnormal   Collection Time    06/20/13  9:45 PM      Result Value Ref Range   Glucose-Capillary 146 (*) 70 - 99 mg/dL   Comment 1 Notify RN    GLUCOSE, CAPILLARY     Status: Abnormal   Collection Time    06/21/13  8:02 AM      Result Value Ref Range   Glucose-Capillary 115 (*) 70 - 99 mg/dL  GLUCOSE, CAPILLARY     Status: Abnormal   Collection Time    06/21/13 12:14 PM      Result Value Ref Range   Glucose-Capillary 126 (*) 70 - 99 mg/dL  GLUCOSE, CAPILLARY     Status: Abnormal   Collection Time    06/21/13  5:32 PM      Result Value Ref Range   Glucose-Capillary 109 (*) 70 - 99 mg/dL  GLUCOSE, CAPILLARY     Status: Abnormal   Collection Time    06/21/13  9:35 PM      Result Value Ref Range   Glucose-Capillary 159 (*) 70 - 99 mg/dL  GLUCOSE, CAPILLARY     Status: Abnormal   Collection Time    06/21/13 10:12 PM      Result Value Ref Range   Glucose-Capillary 131 (*) 70 - 99 mg/dL   Comment 1 Notify RN    GLUCOSE, CAPILLARY     Status: Abnormal   Collection Time    06/22/13  8:06 AM      Result Value Ref Range   Glucose-Capillary 100 (*) 70 - 99 mg/dL  GLUCOSE, CAPILLARY     Status: Abnormal   Collection Time    06/22/13 12:19 PM      Result Value Ref Range   Glucose-Capillary 122 (*) 70 - 99 mg/dL   Dg Chest 2 View  06/22/2013   CLINICAL DATA:  Hydro pneumothorax.  EXAM: CHEST  2 VIEW  COMPARISON:  Chest x-ray 06/21/2013.  FINDINGS: Mediastinum and hilar structures are normal. Left lower lobe infiltrates noted consistent with pneumonia. Left-sided pleural effusion is present. Air-fluid level noted in the left upper chest on prior chest x-ray no longer identified. Left clavicular fracture is again noted. Bilateral rib fractures are present.  IMPRESSION: 1. Left lower lobe infiltrate consistent with pneumonia. Left pleural effusion. Previously identified air-fluid level  left chest no longer identified. 2. Left clavicular and bilateral rib fractures.  No pneumothorax.   Electronically Signed   By: Marcello Moores  Register   On: 06/22/2013 08:47   Dg Chest 2 View  06/21/2013   CLINICAL DATA:  Chest pain  EXAM: CHEST  2 VIEW  COMPARISON:  06/20/2013  FINDINGS: There again noted multiple left rib fractures as well as a left clavicular fracture. There stable in appearance. Chronic changes in the left scapula are seen. There is been removal of left-sided chest tubes when compare with the prior exam. An air-fluid level is noted in the upper left hemi thorax consistent with a mild hydropneumothorax. Persistent increased density in the left base is noted. The right lung is well expanded. Healed rib fractures are noted on the right. The previously seen right-sided central venous line is been removed in the interval.  IMPRESSION: Stable left rib fractures and left clavicular fracture.  Interval chest tube removal on the left.  There is a new air-fluid level in the upper chest  on the left consistent with a small hydropneumothorax.  Persistent left basilar infiltrate   Electronically Signed   By: Inez Catalina M.D.   On: 06/21/2013 08:15       Medical Problem List and Plan: 1. Functional deficits secondary to left clavicle, rib fxs, ptx and flail chest after fall 2.  DVT Prophylaxis/Anticoagulation: Mechanical: Sequential compression devices, below knee Bilateral lower extremities 3. Pain Management: Will continue prn oxycodone.  4. Mood: Stable. Will have LCSW follow for evaluation and support.  5. Neuropsych: This patient is capable of making decisions on her own behalf. 6. ABLA:  Will add iron supplement 7. Pneumothorax s/p VATS: Continue IS with flutter valve. On duonebs tid.  8. Left Clavicle fracture: Is to be NWB LUE with figure 8 splint for support.  9. LLL PNA:  Reactive leucocytosis resolving. Continue Maxipime and Vancomycin for now. Follow up CXR on 06/22 and monitor  WBC/temperature trends.  10 Malnutrition: Add protein supplement.   Post Admission Physician Evaluation: 1. Functional deficits secondary  to polytrauma after fall. 2. Patient is admitted to receive collaborative, interdisciplinary care between the physiatrist, rehab nursing staff, and therapy team. 3. Patient's level of medical complexity and substantial therapy needs in context of that medical necessity cannot be provided at a lesser intensity of care such as a SNF. 4. Patient has experienced substantial functional loss from his/her baseline which was documented above under the "Functional History" and "Functional Status" headings.  Judging by the patient's diagnosis, physical exam, and functional history, the patient has potential for functional progress which will result in measurable gains while on inpatient rehab.  These gains will be of substantial and practical use upon discharge  in facilitating mobility and self-care at the household level. 5. Physiatrist will provide 24 hour management of medical needs as well as oversight of the therapy plan/treatment and provide guidance as appropriate regarding the interaction of the two. 6. 24 hour rehab nursing will assist with bladder management, bowel management, safety, skin/wound care, disease management, medication administration, pain management and patient education  and help integrate therapy concepts, techniques,education, etc. 7. PT will assess and treat for/with: Lower extremity strength, range of motion, stamina, balance, functional mobility, safety, adaptive techniques and equipment, pain mgt, ortho precautions.   Goals are: mod I to supervision. 8. OT will assess and treat for/with: ADL's, functional mobility, safety, upper extremity strength, adaptive techniques and equipment, pain mgt, ortho precautions.   Goals are: supervision to min assist. 9. SLP will assess and treat for/with: n/a.  Goals are: n/a. 10. Case Management and Social  Worker will assess and treat for psychological issues and discharge planning. 11. Team conference will be held weekly to assess progress toward goals and to determine barriers to discharge. 12. Patient will receive at least 3 hours of therapy per day at least 5 days per week. 13. ELOS: 7 days       14. Prognosis:  good     [name], MD, [place] [facility]   06/22/2013

## 2013-06-22 NOTE — Progress Notes (Signed)
Received Pt. As a transfer from [facility].Keep monitoring pt. Closely and assessing his needs.

## 2013-06-22 NOTE — Progress Notes (Signed)
Patient ID: James [name], male   DOB: [birth year], 53 y.o.   MRN: [id number]  LOS: 18 days   Subjective: Denies sob today.  Tolerating diet, had a BM, voiding, ambulating.  VSS.  Afebrile.   Objective: Vital signs in last 24 hours: Temp:  [98.3 F (36.8 C)] 98.3 F (36.8 C) (06/19 0528) Pulse Rate:  [73-102] 73 (06/19 0528) Resp:  [18-20] 20 (06/19 0528) BP: (119-148)/(74-88) 148/88 mmHg (06/19 0528) SpO2:  [96 %-100 %] 100 % (06/19 0528) Weight:  [149 lb 12.8 oz (67.949 kg)] 149 lb 12.8 oz (67.949 kg) (06/19 0528) Last BM Date: 06/21/13  Lab Results:  CBC No results found for this basename: WBC, HGB, HCT, PLT,  in the last 72 hours BMET No results found for this basename: NA, K, CL, CO2, GLUCOSE, BUN, CREATININE, CALCIUM,  in the last 72 hours  Imaging: Dg Chest 2 View  06/21/2013   CLINICAL DATA:  Chest pain  EXAM: CHEST  2 VIEW  COMPARISON:  06/20/2013  FINDINGS: There again noted multiple left rib fractures as well as a left clavicular fracture. There stable in appearance. Chronic changes in the left scapula are seen. There is been removal of left-sided chest tubes when compare with the prior exam. An air-fluid level is noted in the upper left hemi thorax consistent with a mild hydropneumothorax. Persistent increased density in the left base is noted. The right lung is well expanded. Healed rib fractures are noted on the right. The previously seen right-sided central venous line is been removed in the interval.  IMPRESSION: Stable left rib fractures and left clavicular fracture.  Interval chest tube removal on the left.  There is a new air-fluid level in the upper chest on the left consistent with a small hydropneumothorax.  Persistent left basilar infiltrate   Electronically Signed   By: Inez Catalina M.D.   On: 06/21/2013 08:15   Dg Chest Port 1 View  06/20/2013   CLINICAL DATA:  Status post left thoracotomy.  EXAM: PORTABLE CHEST - 1 VIEW  COMPARISON:  June 19, 2013.  FINDINGS:  Stable cardiomediastinal silhouette. Right lung is clear. Right subclavian catheter is again noted with tip in expected position of the SVC. Multiple left rib fractures are again noted. Two left-sided chest tubes are again noted which are unchanged in position. No definite pneumothorax is noted. Stable left basilar opacity is noted concerning for pneumonia or atelectasis with small associated pleural effusion.  IMPRESSION: Stable left basilar opacity compared to prior exam. 2 left-sided chest tubes remain without definite evidence of pneumothorax.   Electronically Signed   By: Sabino Dick M.D.   On: 06/20/2013 09:02    PE:  General appearance: no acute distress, alert and oriented.  Resp: diminished on the left, CTA BUL, RLL. 1100 IS.  Cardio: regular rate and rhythm, S1, S2 normal, no murmur, click, rub or gallop  GI: soft, non-tender; bowel sounds normal; no masses, no organomegaly    Patient Active Problem List   Diagnosis Date Noted  . Hemothorax on left 06/11/2013  . Acute respiratory failure 06/08/2013  . Altered mental status 06/08/2013  . Pneumonia, organism unspecified 06/08/2013  . Fall from ladder 06/05/2013    Assessment/Plan:  Fall from ladder  Acute respiratory failure/Multiple left sided rib fracture/HPTX -  -S/P VATS, drainage of hemothorax, decortication, repair of lung laceration, resection of LLL wedge---Dr. Prescott Gum 06/11/13  -IS/flutter valve  -follow up Cxr this AM L PNA - CXR stable infiltrate to  L side. on Vanc/Cefepime empiric, CX P D#5/7  Left comminuted clavicle fracture - Figure 8 strap, outpatient follow up with Dr. Onnie Graham  VTE - SCD's, Lovenox  HTN - hydralazine PRN, scheduled lopressor  FEN - tolerating diet  ID - Vanc/Cefepime for HCAP, sputum cx negative  ABL anemia - stable  Hx opioid dependence  Acute delirium - resolved  EtOH abuse-CIWA  Dispo - if CXR remains stable this AM, may be discharged to [facility], [facility] Pager:  [phone] General Trauma [place] Pager: [phone]   06/22/2013 7:34 AM

## 2013-06-22 NOTE — Discharge Summary (Signed)
Physician Discharge Summary  ESTANISLADO SURGEON ZOX:[id number] DOB: 1960-11-20 DOA: 06/04/2013  PCP: No primary provider on file.  Consultation: TCTS---Dr. Prescott Gum    Orthopedics---Dr. Rolena Infante, OP f/u with Dr. Onnie Graham    CCM  Admit date: 06/04/2013 Discharge date: 06/22/2013  Recommendations for Outpatient Follow-up:   Follow-up Information   Follow up with [facility].   Contact information:   [address] [place] [address] [phone]       Call VAN Wilber Oliphant, MD.   Specialty:  Cardiothoracic Surgery   Contact information:   [place] [address] [phone]       Call Marin Shutter, MD. (this is a follow up for your broken clavicle)    Specialty:  Orthopedic Surgery   Contact information:   [address] [place] [place] [address] [phone]      Discharge Diagnoses:  1. Fall from ladder 2. Acute respiratory failure 3. Multiple left sided rib fractures 4. Hemopneumothorax 5. Left  Pneumonia 6. Left comminuted clavicle fracture 7. Hypertension 8. ABL anemia 9. EtOH abuse 10. Acute encephalopathy 11. Hx opioid dependence  12. hypokalemia   Surgical Procedure: S/P VATS, drainage of hemothorax, decortication, repair of lung laceration, resection of LLL wedge---Dr. Prescott Gum 06/11/13    Discharge Condition: stable Disposition: CIR  Diet recommendation: regular  Filed Weights   06/20/13 0308 06/21/13 0534 06/22/13 0528  Weight: 161 lb 2.5 oz (73.1 kg) 152 lb 12.8 oz (69.31 kg) 149 lb 12.8 oz (67.949 kg)    Filed Vitals:   06/22/13 0528  BP: 148/88  Pulse: 73  Temp: 98.3 F (36.8 C)  Resp: 20     Hospital Course:  James [name] presented to [facility] after falling off a ladder.  He was noted to be a heavy smoker(2 PPD).  He was found to have a small hemopneumothorax with multiple left sided rib fractures as well as a left comminuted clavicle fracture.  Orthopedics was consulted for  the left clavicle fracture and recommended non operative management with a figure 8 sling and outpatient follow up with Dr. Onnie Graham per Dr. Rolena Infante.    He was admitted to the ICU for close monitoring given his baseline respiratory function.  A follow up CXR was stable, however, he was further monitored in the ICU.  Due to increased pain, an epidural was placed by anesthesia.  His respiratory status began to decline, he was intubated and bronched.  He was started on empiric Vanc and Zosyn for suspected PNA.  Feedings started per OGT.  A CT of chest was obtained which showed a worsening hemothorax and therefore TCTS was consulted.   Dr. Prescott Gum recommended proceeding with surgery.  Post operatively, he was kept intubated and had 3 chest tubes.  Epidural was removed as the patient had an ON-Q for pain.  He was weaned and extubated.  lovenox was resumed.  Potassium was supplemented and monitored.  He was transferred to SDU.  On HD#11 the patient developed acute delirium.  He is a known heavy drinker, he was placed on CIWA protocol upon admission.  CTH was negative for acute abnormalities.  Laboratory work up was unrevealing.  He was transferred back to the ICU And started on a Precedex drip due to the delirium.  We changed and discontinued medication that could potentially be the cause.  Overtime, this resolved and the precedex was stopped.    Chest tubes were removed per TCTS.  A post removal CXR showed a small hydropneumothrax and he was therefore kept overnight.  A follow up chest x ray did not reveal this and showed a stable cxr.  He was therefore felt stable to transfer for CIR.  His pain is well controlled. He is completing a second course of treatment for pneumonia.  Vital signs are stable and labs are stable.  He is to follow up with Dr. Onnie Graham and Dr. Prescott Gum.  He may follow up with trauma services on as needed basis.       Discharge Instructions     Medication List    Notice   You have not been  prescribed any medications.         Follow-up Information   Follow up with [facility].   Contact information:   [address] [place] [address] [phone]       Call VAN Wilber Oliphant, MD.   Specialty:  Cardiothoracic Surgery   Contact information:   [place] [address] [phone]       Call Marin Shutter, MD. (this is a follow up for your broken clavicle)    Specialty:  Orthopedic Surgery   Contact information:   [address] [place] [address] [place] [address] [phone]        The results of significant diagnostics from this hospitalization (including imaging, microbiology, ancillary and laboratory) are listed below for reference.    Significant Diagnostic Studies: Dg Chest 1 View  06/04/2013   CLINICAL DATA:  History of trauma from a fall. Shortness of breath. Possible pneumothorax.  EXAM: CHEST - 1 VIEW  COMPARISON:  No priors.  FINDINGS: There are multiple displaced left-sided rib fractures involving the lateral aspect of the left second, third, fourth, fifth, sixth, seventh and eighth ribs. There appears to be a trace left-sided pneumothorax, and some gas in the subcutaneous tissues of the left chest wall. No acute consolidative airspace disease. No pleural effusions. No evidence of pulmonary edema. Heart size is normal. Mediastinal contours are unremarkable. Multiple old healed right-sided rib fractures posterolaterally are incidentally noted.  IMPRESSION: 1. Multiple acute mildly displaced left-sided rib fractures with trace left pneumothorax and a small amount of subcutaneous emphysema in the left chest wall. Critical Value/emergent results were called by telephone at the time of interpretation on 06/04/2013 at 8:20 PM to Dr. Ezequiel Essex, who verbally acknowledged these results.   Electronically Signed   By: Vinnie Langton M.D.   On: 06/04/2013 20:22   Dg Chest 2 View  06/22/2013   CLINICAL DATA:   Hydro pneumothorax.  EXAM: CHEST  2 VIEW  COMPARISON:  Chest x-ray 06/21/2013.  FINDINGS: Mediastinum and hilar structures are normal. Left lower lobe infiltrates noted consistent with pneumonia. Left-sided pleural effusion is present. Air-fluid level noted in the left upper chest on prior chest x-ray no longer identified. Left clavicular fracture is again noted. Bilateral rib fractures are present.  IMPRESSION: 1. Left lower lobe infiltrate consistent with pneumonia. Left pleural effusion. Previously identified air-fluid level left chest no longer identified. 2. Left clavicular and bilateral rib fractures.  No pneumothorax.   Electronically Signed   By: Marcello Moores  Register   On: 06/22/2013 08:47   Dg Chest 2 View  06/21/2013   CLINICAL DATA:  Chest pain  EXAM: CHEST  2 VIEW  COMPARISON:  06/20/2013  FINDINGS: There again noted multiple left rib fractures as well as a left clavicular fracture. There  stable in appearance. Chronic changes in the left scapula are seen. There is been removal of left-sided chest tubes when compare with the prior exam. An air-fluid level is noted in the upper left hemi thorax consistent with a mild hydropneumothorax. Persistent increased density in the left base is noted. The right lung is well expanded. Healed rib fractures are noted on the right. The previously seen right-sided central venous line is been removed in the interval.  IMPRESSION: Stable left rib fractures and left clavicular fracture.  Interval chest tube removal on the left.  There is a new air-fluid level in the upper chest on the left consistent with a small hydropneumothorax.  Persistent left basilar infiltrate   Electronically Signed   By: Inez Catalina M.D.   On: 06/21/2013 08:15   Dg Chest 2 View  06/06/2013   CLINICAL DATA:  eval PTX  EXAM: CHEST - 2 VIEW  COMPARISON:  the previous day's study  FINDINGS: There is a persistent left pleural effusion. Multiple displaced left rib fractures and a displaced left  clavicle fracture as before. No definite pneumothorax component. Consolidation/ atelectasis in the left lower lobe. There is an enlarging left mid lung opacity having sharply defined medial and inferior margins, less well-defined superior and lateral margins, which may represent some fluid in the fissure versus focal pulmonary consolidation possibly contusion given the history of recent trauma.  Right lung remains clear. Multiple old right rib fractures. Heart size remains normal. .  IMPRESSION: 1. Persistent left pleural effusion and left lower lung consolidation/atelectasis. 2. More conspicuous left mid lung opacity may represent progressive focal contusion/consolidation or pseudotumor.   Electronically Signed   By: Arne Cleveland M.D.   On: 06/06/2013 08:56   Dg Pelvis 1-2 Views  06/04/2013   CLINICAL DATA:  Fall from ladder  EXAM: PELVIS - 1-2 VIEW  COMPARISON:  None.  FINDINGS: There is no evidence of pelvic fracture or diastasis. No other pelvic bone lesions are seen.  IMPRESSION: Negative.   Electronically Signed   By: Jacqulynn Cadet M.D.   On: 06/04/2013 20:16   Ct Head Wo Contrast  06/16/2013   CLINICAL DATA:  Acute delirium.  EXAM: CT HEAD WITHOUT CONTRAST  TECHNIQUE: Contiguous axial images were obtained from the base of the skull through the vertex without intravenous contrast.  COMPARISON:  Prior CT from 06/04/2013  FINDINGS: Scattered hypodensity within the periventricular and deep white matter both cerebral hemispheres is present, most compatible with mild chronic microvascular ischemic disease, unchanged. Cerebral volume within normal limits for patient age.  There is no acute intracranial hemorrhage or infarct. 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 No mass lesion or midline shift. Gray-white matter differentiation is well maintained. Ventricles are normal in size without evidence of hydrocephalus. CSF containing spaces are within normal  limits. No extra-axial fluid collection.  The calvarium is intact.  Orbital soft tissues are within normal limits.  The paranasal sinuses are well pneumatized and free of fluid. Minimal scattered opacity noted within the mastoid air cells bilaterally.  Scalp soft tissues are unremarkable.  IMPRESSION: 1. No acute intracranial abnormality. 2. Small bilateral mastoid effusions.   Electronically Signed   By: Jeannine Boga M.D.   On: 06/16/2013 02:23   Ct Head Wo Contrast  06/04/2013   CLINICAL DATA:  FALL  EXAM: CT HEAD WITHOUT CONTRAST  TECHNIQUE: Contiguous axial images were obtained from the base of the skull through the vertex without intravenous contrast.  COMPARISON:  None.  FINDINGS: No acute intracranial abnormality. Specifically, no hemorrhage, hydrocephalus, mass lesion, acute infarction, or significant intracranial injury. No acute calvarial abnormality. The visualized paranasal sinuses and mastoid air cells are patent.  IMPRESSION: No acute intracranial abnormality.   Electronically Signed   By: Margaree Mackintosh M.D.   On: 06/04/2013 21:11   Ct Chest Wo Contrast  06/09/2013   CLINICAL DATA:  Respiratory failure. Left lung atelectasis. Left rib fractures and hemothorax. On ventilator.  EXAM: CT CHEST WITHOUT CONTRAST  TECHNIQUE: Multidetector CT imaging of the chest was performed following the standard protocol without IV contrast.  COMPARISON:  06/04/2013  FINDINGS: Multiple left-sided rib fractures again noted. Increased size of left pleural effusion demonstrated with some high attenuation areas likely representing hemothorax. There is also increased compressive atelectasis of left lower lobe.  New infiltrate is seen in the posterior right lower lobe. No evidence of right-sided pleural effusion. Mild pulmonary emphysema noted. No hilar or mediastinal masses are identified. Endotracheal tube and nasogastric tube are seen in place.  IMPRESSION: Increased moderate left hemothorax and compressive left  lower lobe atelectasis.  Multiple left rib fractures again noted. No pneumothorax identified.  New infiltrate or atelectasis in the posterior right lower lobe. Pneumonia cannot be excluded.  Mild emphysema.   Electronically Signed   By: Earle Gell M.D.   On: 06/09/2013 18:52   Ct Chest W Contrast  06/04/2013   CLINICAL DATA:  Fall.  Rib fractures.  EXAM: CT CHEST, ABDOMEN, AND PELVIS WITH CONTRAST  TECHNIQUE: Multidetector CT imaging of the chest, abdomen and pelvis was performed following the standard protocol during bolus administration of intravenous contrast.  CONTRAST:  176mL OMNIPAQUE IOHEXOL 300 MG/ML  SOLN  COMPARISON:  None.  FINDINGS: CT CHEST FINDINGS  There fractures of the first through ninth ribs on the left. The fracture of the first and second ribs are oblique along the posterior aspect of the ribs, minimally displaced. Fractures of the third through eighth ribs are both posterior hand lateral. Several of the lateral fractures are mildly displaced. The ninth rib fracture is lateral only.  There is opacity in the dependent left lower lobe with a small pleural effusion. There is a cystic area with an air-fluid level that extends for 8.5 cm from superior to inferior measuring 2.7 cm transversely, consistent with along lung laceration. Opacity adjacent to this is consistent with pulmonary contusion and atelectasis. There are additional small collections of air within this area of opacity consistent with small, loculated pneumothoraces. There is no evidence of a pneumothorax anteriorly or at the apex. There is significant paraseptal emphysema, which is greater on the left than the right. There is subcutaneous air adjacent to rib fractures along the left posterior lateral chest.  No right lung contusion or laceration.  No right pleural effusion.  There is also a displaced, mildly comminuted fracture of the midshaft of the left clavicle. Left shoulder joint is normally aligned as is the [facility] joint.  No  neck base or axillary masses or adenopathy. No evidence of a mediastinal hematoma. The great vessels are unremarkable. Normal heart.  CT ABDOMEN AND PELVIS FINDINGS  Liver, spleen, gallbladder, pancreas, adrenal glands, kidneys, ureters, bladder: Unremarkable.  No bowel wall thickening to suggest a bowel hematoma. No evidence of a mesenteric hematoma. No evidence of a vascular injury. No fracture of the visualized spine. No pelvic fracture.  IMPRESSION: 1. Trauma to the left chest. There are numerous rib fractures, most of which are fractured  in 2 locations. There is an associated posterior left lower lobe lung laceration with surrounding contusion/hemorrhage and a small effusion. Small areas of loculated pneumothorax are noted posteriorly, but there is no free or nondependent pneumothorax. Air has extended through rib fractures into the deep soft tissues of the posterior lateral left chest. 2. Comminuted left clavicle fracture. 3. Heart, mediastinum great vessels are unremarkable. No right lung or chest wall trauma. No vertebral fracture. 4. No acute findings below the diaphragm.   Electronically Signed   By: Lajean Manes M.D.   On: 06/04/2013 21:24   Ct Cervical Spine Wo Contrast  06/04/2013   CLINICAL DATA:  Fall.  EXAM: CT CERVICAL SPINE WITHOUT CONTRAST  TECHNIQUE: Multidetector CT imaging of the cervical spine was performed without intravenous contrast. Multiplanar CT image reconstructions were also generated.  COMPARISON:  None.  FINDINGS: There is left first and second rib fractures which are partially imaged. Emphysema and pneumothorax or bleb at the left apex, reference dedicated chest CT imaging. There is no evidence of cervical spine fracture or traumatic malalignment. Cervical spondylosis which is most notable in the mid and lower region, with disc narrowing and endplate spurring advanced at C5-6 and C6-7. Disc osteophyte complex at C5-6 likely contacts the ventral cord. Uncovertebral spurs at C6-7  cause moderate narrowing of the bilateral foramina.  IMPRESSION: 1. No evidence of acute cervical spine injury. 2. Left upper chest injury, reference dedicated chest CT. 3. Cervical spondylosis causing C5-6 moderate spinal canal stenosis.   Electronically Signed   By: Jorje Guild M.D.   On: 06/04/2013 22:26   Ct Abdomen Pelvis W Contrast  06/04/2013   CLINICAL DATA:  Fall.  Rib fractures.  EXAM: CT CHEST, ABDOMEN, AND PELVIS WITH CONTRAST  TECHNIQUE: Multidetector CT imaging of the chest, abdomen and pelvis was performed following the standard protocol during bolus administration of intravenous contrast.  CONTRAST:  164mL OMNIPAQUE IOHEXOL 300 MG/ML  SOLN  COMPARISON:  None.  FINDINGS: CT CHEST FINDINGS  There fractures of the first through ninth ribs on the left. The fracture of the first and second ribs are oblique along the posterior aspect of the ribs, minimally displaced. Fractures of the third through eighth ribs are both posterior hand lateral. Several of the lateral fractures are mildly displaced. The ninth rib fracture is lateral only.  There is opacity in the dependent left lower lobe with a small pleural effusion. There is a cystic area with an air-fluid level that extends for 8.5 cm from superior to inferior measuring 2.7 cm transversely, consistent with along lung laceration. Opacity adjacent to this is consistent with pulmonary contusion and atelectasis. There are additional small collections of air within this area of opacity consistent with small, loculated pneumothoraces. There is no evidence of a pneumothorax anteriorly or at the apex. There is significant paraseptal emphysema, which is greater on the left than the right. There is subcutaneous air adjacent to rib fractures along the left posterior lateral chest.  No right lung contusion or laceration.  No right pleural effusion.  There is also a displaced, mildly comminuted fracture of the midshaft of the left clavicle. Left shoulder joint is  normally aligned as is the [facility] joint.  No neck base or axillary masses or adenopathy. No evidence of a mediastinal hematoma. The great vessels are unremarkable. Normal heart.  CT ABDOMEN AND PELVIS FINDINGS  Liver, spleen, gallbladder, pancreas, adrenal glands, kidneys, ureters, bladder: Unremarkable.  No bowel wall thickening to suggest a bowel hematoma. No  evidence of a mesenteric hematoma. No evidence of a vascular injury. No fracture of the visualized spine. No pelvic fracture.  IMPRESSION: 1. Trauma to the left chest. There are numerous rib fractures, most of which are fractured in 2 locations. There is an associated posterior left lower lobe lung laceration with surrounding contusion/hemorrhage and a small effusion. Small areas of loculated pneumothorax are noted posteriorly, but there is no free or nondependent pneumothorax. Air has extended through rib fractures into the deep soft tissues of the posterior lateral left chest. 2. Comminuted left clavicle fracture. 3. Heart, mediastinum great vessels are unremarkable. No right lung or chest wall trauma. No vertebral fracture. 4. No acute findings below the diaphragm.   Electronically Signed   By: Lajean Manes M.D.   On: 06/04/2013 21:24   Dg Chest Port 1 View  06/20/2013   CLINICAL DATA:  Status post left thoracotomy.  EXAM: PORTABLE CHEST - 1 VIEW  COMPARISON:  June 19, 2013.  FINDINGS: Stable cardiomediastinal silhouette. Right lung is clear. Right subclavian catheter is again noted with tip in expected position of the SVC. Multiple left rib fractures are again noted. Two left-sided chest tubes are again noted which are unchanged in position. No definite pneumothorax is noted. Stable left basilar opacity is noted concerning for pneumonia or atelectasis with small associated pleural effusion.  IMPRESSION: Stable left basilar opacity compared to prior exam. 2 left-sided chest tubes remain without definite evidence of pneumothorax.   Electronically Signed    By: Sabino Dick M.D.   On: 06/20/2013 09:02   Dg Chest Port 1 View  06/19/2013   CLINICAL DATA:  Respiratory failure.  Post thoracotomy.  EXAM: PORTABLE CHEST - 1 VIEW  COMPARISON:  06/18/2013  FINDINGS: Two left chest tubes and right central line are unchanged. No pneumothorax. Left lower lobe atelectasis or infiltrate with small left effusion. Right lung is clear. Multiple left rib fractures again noted. Heart is normal size.  IMPRESSION: No significant change.  No pneumothorax.   Electronically Signed   By: Rolm Baptise M.D.   On: 06/19/2013 08:06   Dg Chest Port 1 View  06/18/2013   CLINICAL DATA:  Chest tube.  EXAM: PORTABLE CHEST - 1 VIEW  COMPARISON:  06/17/2013.  FINDINGS: Two left chest tubes in stable position. PICC line stable position. Persistent atelectasis/ infiltrate and left pleural effusion noted unchanged. No pneumothorax. Right lung is clear. Heart size stable. Multiple left rib fractures again noted. Left scapular fracture cannot be excluded.  IMPRESSION: 1. Line and tube positions stable. Two left chest tubes are in unchanged position. No pneumothorax. 2. Persistent atelectasis and/or infiltrates and left-sided pleural effusion. 3. Persistent mild left rib fractures. Left scapular fracture cannot be excluded.   Electronically Signed   By: Marcello Moores  Register   On: 06/18/2013 08:00   Dg Chest Port 1 View  06/17/2013   CLINICAL DATA:  No breath sounds on left side  EXAM: PORTABLE CHEST - 1 VIEW  COMPARISON:  06/16/2013  FINDINGS: Increasing volume loss with left lower lobe opacity, possibly reflecting a combination of atelectasis and increasing pleural effusion/hemorrhage.  Indwelling left apical and left basilar chest tubes. No pneumothorax is seen.  Leftward mediastinal shift.  Right lung is clear.  Old right posterior 5th and 7th rib fractures. Multiple acute left rib fractures.  Right subclavian venous catheter terminates in the mid SVC.  IMPRESSION: Indwelling left chest tubes.  No  pneumothorax is seen.  Increasing opacity in the left hemithorax, left  lower lobe predominant, possibly reflecting a combination of atelectasis and increasing pleural effusion/hemorrhage.  Multiple left rib fractures.  Leftward mediastinal shift.   Electronically Signed   By: Julian Hy M.D.   On: 06/17/2013 02:26   Dg Chest Port 1 View  06/16/2013   CLINICAL DATA:  VATS  EXAM: PORTABLE CHEST - 1 VIEW  COMPARISON:  Chest x-ray from yesterday  FINDINGS: Unchanged positioning of 2 left-sided chest tubes. Stable positioning of right subclavian central line, tip near the upper SVC.  Stable heart size and upper mediastinal contours.  Worsening aeration on the left, with volume loss. Left-sided pleural fluid or pleural thickening is again noted along the lateral chest wall, where there are multiple displaced rib fractures. No pneumothorax. Well aerated right lung. Additional osseous trauma as described on admission CT, including a comminuted left scapular fracture. There are remote right-sided rib fractures.  IMPRESSION: Worsening atelectasis on the left. No evidence of increasing pleural fluid or air leak.   Electronically Signed   By: Jorje Guild M.D.   On: 06/16/2013 07:46   Dg Chest Port 1 View  06/15/2013   CLINICAL DATA:  Chest trauma  EXAM: PORTABLE CHEST - 1 VIEW  COMPARISON:  June 14, 2013  FINDINGS: One of the chest tubes on the left side has been removed. Two chest tubes remain on the left which are unchanged in position compared to 1 day prior. There is no demonstrable pneumothorax. There is loculated fluid on the left which appears slightly decreased. There is less consolidation in the left lower lobe compared to 1 day prior. Right lung remains clear. Heart size and pulmonary vascularity are normal. No adenopathy. Rib trauma on the left remains unchanged.  IMPRESSION: No pneumothorax status post removal of one of the chest tubes on the left. There is less left lower lobe consolidation  slightly less loculated fluid on the left compared to 1 day prior. Right lung remains clear. No change in cardiac silhouette. There is less volume loss on the left currently compared to 1 day prior.   Electronically Signed   By: Lowella Grip M.D.   On: 06/15/2013 08:25   Dg Chest Port 1 View  06/14/2013   CLINICAL DATA:  Followup thoracotomy  EXAM: PORTABLE CHEST - 1 VIEW  COMPARISON:  06/13/2013  FINDINGS: Allowing for differences in patient positioning, there has been no significant change. 3 chest tubes are noted on the left, stable. There is left lung opacity that extends from the perihilar mid lung to the lung base where it is more confluent. There is at least a small pleural effusion. There is no convincing pneumothorax. Multiple rib fractures on the left are stable.  Right lung is hyperexpanded. There are mildly thickened interstitial markings. No right pleural effusion or pneumothorax.  Cardiac silhouette is mildly enlarged.  Right subclavian central venous line is stable and well positioned.  IMPRESSION: 1. No significant change from the previous exam. 2. No pneumothorax. 3. Left lung opacity, probable small left effusion and volume loss on the left, as well as the 3 left-sided chest tubes, are all stable.   Electronically Signed   By: Lajean Manes M.D.   On: 06/14/2013 07:43   Dg Chest Port 1 View  06/13/2013   CLINICAL DATA:  Prior thoracotomy.  EXAM: PORTABLE CHEST - 1 VIEW  COMPARISON:  Chest x-ray 06/12/2013.  FINDINGS: Endotracheal tube, NG tube, central line in stable position. Three left chest tubes in stable position. Previously identified left small  pneumothorax no longer visualized. Basilar atelectatic changes are present particularly on the left. Stable left pleural effusion. Stable cardiomegaly, normal pulmonary vascularity. Multiple bilateral rib fractures again noted. Left clavicular fracture is present.  IMPRESSION: 1. Stable line and tube positions. 2. Three left chest tubes in  stable position. Previously identified small pneumothorax no longer visualized. 3. Persistent bibasilar atelectasis particularly on the left. Small stable left pleural effusion. 4. Multiple bilateral rib fractures and left clavicular fracture again noted.   Electronically Signed   By: Marcello Moores  Register   On: 06/13/2013 07:36   Dg Chest Port 1 View  06/12/2013   CLINICAL DATA:  Hypoxia  EXAM: PORTABLE CHEST - 1 VIEW  COMPARISON:  June 11, 2013  FINDINGS: Endotracheal tube tip is 5.3 cm above the carina. Nasogastric tube tip and side port are in the stomach. Central catheter tip is in the superior cava. There are 3 chest tubes on the left. Only a a rather minimal left lateral pneumothorax remains. There are multiple rib fractures on the left. There is left base consolidation with effusion. Right lung is clear. Heart size and pulmonary vascularity are normal. No apparent adenopathy.  IMPRESSION: Rather minimal pneumothorax remaining on the left laterally. There are 3 chest tubes in place. There does remain effusion and consolidation in the left base. Multiple left rib fractures are again noted. Right lung is clear. Other tubes and catheters as described.   Electronically Signed   By: Lowella Grip M.D.   On: 06/12/2013 07:25   Dg Chest Portable 1 View  06/11/2013   CLINICAL DATA:  Post bronchoscopy.  EXAM: PORTABLE CHEST - 1 VIEW  COMPARISON:  06/11/2013 and CT chest 06/09/2013.  FINDINGS: Endotracheal tube terminates 2.8 cm above the carina. Nasogastric tube terminates in the stomach. Right subclavian central line tip projects over the SVC. Three left chest tubes are in place without a definite pneumothorax. There is airspace disease at the left lung base, slightly improved from 06/11/2013. Complete a near-complete resolution of previously seen left pleural effusion/hemothorax. Right lung is clear.  Acute left rib fractures with subcutaneous emphysema along the left chest wall. Old right rib fractures.   IMPRESSION: 1. No definite left pneumothorax with 3 chest tubes in place. 2. Improving left basilar aeration with interval decrease in left pleural fluid/hemothorax. 3. Multiple left rib fractures with subcutaneous emphysema along the left chest wall.   Electronically Signed   By: Lorin Picket M.D.   On: 06/11/2013 11:32   Dg Chest Port 1 View  06/11/2013   CLINICAL DATA:  Chest trauma and increasing left-sided hemothorax. The patient is scheduled for the VATS today.  EXAM: PORTABLE CHEST - 1 VIEW  COMPARISON:  06/10/2013 and CT of the chest on 06/09/2013  FINDINGS: Endotracheal tube remains with the tip approximately 2 cm above the carina. Nasogastric tube extends below the diaphragm. Stable positioning of central line. Chest shows fairly stable complex left pleural effusion consistent with hemothorax by recent CT. Multiple displaced left-sided rib fractures are again noted. No visible pneumothorax. No pulmonary edema. The heart size and mediastinal contours are stable.  IMPRESSION: Relatively stable left hemothorax.  No pneumothorax identified.   Electronically Signed   By: Aletta Edouard M.D.   On: 06/11/2013 07:48   Dg Chest Port 1 View  06/10/2013   CLINICAL DATA:  Endotracheal tube placement.  EXAM: PORTABLE CHEST - 1 VIEW  COMPARISON:  06/09/2013 as well as chest CT 06/09/2013  FINDINGS: Endotracheal tube has tip 6.6 cm  above the carina. Nasogastric tube has hypo over the stomach in the left upper quadrant as tip is not visualized. Subclavian central venous catheter is unchanged. Right lung is clear. There is persistent opacification over the left lung which is unchanged compatible patient's known moderate pleural effusion/hemothorax with associated atelectasis. Remainder the exam is unchanged including several left rib fractures.  IMPRESSION: Stable opacification over the left lung compatible with known effusion/hemothorax with associated atelectasis.  Known multiple left rib fractures.  Tubes and  lines as described.   Electronically Signed   By: Marin Olp M.D.   On: 06/10/2013 08:58   Dg Chest Port 1 View  06/09/2013   CLINICAL DATA:  Endotracheal tube  EXAM: PORTABLE CHEST - 1 VIEW  COMPARISON:  Prior radiograph from 06/08/2013  FINDINGS: The tip of the endotracheal tube is positioned approximately 4.2 cm above the carina. Enteric tube courses into the abdomen. Right subclavian central venous catheter is unchanged. Cardiac and mediastinal silhouettes are stable.  A moderate left pleural effusion is again CN, which appears loculated laterally. Associated left mid and lower lung opacities and volume loss are not significantly changed. The right lung remains clear. No pneumothorax or pulmonary edema.  Multiple left-sided rib fractures again noted. Remote right-sided rib fractures again noted.  IMPRESSION: 1. Stable support apparatus with tip of the endotracheal tube approximately 4.2 cm above the carina. 2. Grossly unchanged loculated left pleural effusion with associated left mid and lower lung opacities, which may reflect atelectasis versus contusion. 3. Unchanged minimally displaced left-sided rib fractures. 4. Unchanged remote/healed right-sided rib fractures.   Electronically Signed   By: Jeannine Boga M.D.   On: 06/09/2013 07:00   Dg Chest Port 1 View  06/08/2013   CLINICAL DATA:  Atelectasis, left rib fractures  EXAM: PORTABLE CHEST - 1 VIEW  COMPARISON:  06/07/2013; 06/06/2013; 06/04/2013; chest CT -06/04/2013  FINDINGS: Grossly unchanged cardiac silhouette and mediastinal contours. Stable positioning of support apparatus. Grossly unchanged small loculated left-sided pleural effusion with associated left mid and lower lung heterogeneous/consolidative opacities and mild left-sided volume loss. The right hemithorax is unchanged a remains well aerated. Multiple minimally displaced left-sided rib fractures are re- demonstrated though suboptimally evaluated. No subcutaneous emphysema.  Old/healed right-sided rib fractures.  IMPRESSION: 1.  Stable positioning of support apparatus.  No pneumothorax. 2. Grossly unchanged small loculated left-sided effusion with associated left mid and lower lung opacities, atelectasis versus contusion. 3. Grossly unchanged appearance of multiple acute minimally displaced left-sided rib fractures. 4. Multiple old/healed right-sided rib fractures.   Electronically Signed   By: Sandi Mariscal M.D.   On: 06/08/2013 07:41   Dg Chest Port 1 View  06/07/2013   CLINICAL DATA:  Central line placement and NG tube placement. Shortness of breath.  EXAM: PORTABLE CHEST - 1 VIEW  COMPARISON:  06/07/2013 and prior radiographs  FINDINGS: An endotracheal tube with tip 3.6 cm above the carina again identified.  A right subclavian central venous catheter with tip overlying the mid SVC is present.  An NG tube is identified with tip overlying the proximal stomach.  The left pleural effusion is noted with improved left lung aeration, with continued left lung atelectasis/ airspace disease.  Multiple left-sided rib fractures are again noted.  No definite pneumothorax is noted.  IMPRESSION: Right subclavian central venous catheter with tip overlying the mid.  NG tube with tip overlying the proximal stomach.  Improved left lung aeration with continued atelectasis/airspace disease and left pleural effusion.   Electronically Signed  By: Hassan Rowan M.D.   On: 06/07/2013 13:03   Dg Chest Port 1 View  06/07/2013   CLINICAL DATA:  Status post intubation and bronchoscopy  EXAM: PORTABLE CHEST - 1 VIEW  COMPARISON:  Portable chest x-ray of today's date at 5:48 a.m.  FINDINGS: The endotracheal tube tip lies 4.2 cm above the crotch of the carinal. Aeration of the left lung has improved but parenchymal consolidation laterally and inferiorly process. There is compensatory hyperinflation of the right lung with mild mediastinal shift. Heart borders are obscured. The pulmonary vascularity is not engorged on  the right. Old deformity of the of multiple right ribs is demonstrated. Acute fractures of multiple left ribs are present.  IMPRESSION: There has been improvement in the appearance of the left hemithorax since the bronchoscopy and intubation of the trachea. No pneumothorax is evident. Density on the left laterally and inferiorly may reflect hemothorax given the multiple rib fractures present.   Electronically Signed   By: David  Martinique   On: 06/07/2013 10:33   Dg Chest Port 1 View  06/07/2013   CLINICAL DATA:  Pneumonia  EXAM: PORTABLE CHEST - 1 VIEW  COMPARISON:  Yesterday  FINDINGS: There is now complete opacification of the left hemi thorax compatible with a combination of airspace disease, volume loss, and pleural fluid. Multiple left rib fractures are noted with displacement. No pneumothorax. Right lung is clear.  IMPRESSION: There is now complete opacification of the left hemi thorax as described.  No pneumothorax.   Electronically Signed   By: Maryclare Bean M.D.   On: 06/07/2013 07:51   Dg Chest Port 1 View  06/06/2013   CLINICAL DATA:  Evaluate pneumonia.  Fractured ribs.  EXAM: PORTABLE CHEST - 1 VIEW  COMPARISON:  06/06/2013 8:46 a.m.  FINDINGS: There is significant consolidation throughout most of the left lung, developing since the prior study. There is also volume loss with shift of mediastinal structures to the left.  Right lung is hyperexpanded but essentially clear. No pneumothorax is seen. No right pleural effusion.  Left-sided rib fractures are again noted, without change.  IMPRESSION: New opacity throughout most of the left lung likely a combination of atelectasis and possibly contusion. There is also a left pleural effusion, and there is volume loss with mediastinal shift to the left. No pneumothorax. Right lung remains clear.   Electronically Signed   By: Lajean Manes M.D.   On: 06/06/2013 19:40   Dg Chest Port 1 View  06/05/2013   CLINICAL DATA:  Sob, hypoxia  EXAM: PORTABLE CHEST - 1 VIEW   COMPARISON:  Frontal view of the chest dated 06/05/2013  FINDINGS: The cardiac silhouette is within normal limits. Multiple rib fractures are appreciated on the left unchanged. The left lung base is not included on the study. The small hydro pneumothorax within the lung base is only partially visualized and appears stable. No further pneumothorax is appreciated. Linear areas of increased density are appreciated in left lung base consistent with atelectasis. The lungs otherwise clear. Distal left clavicle fracture with approximately 2-3 cm of override. Right lung is unremarkable.  IMPRESSION: No significant change in the chest radiograph compared to the previous study. The very small hydro pneumothorax within the left lung base appears stable and is only partially visualized. There is evidence of atelectasis within the left lung base.   Electronically Signed   By: Margaree Mackintosh M.D.   On: 06/05/2013 14:53   Dg Chest Port 1 View  06/05/2013  CLINICAL DATA:  Recent trauma with small pneumothorax  EXAM: PORTABLE CHEST - 1 VIEW  COMPARISON:  Chest radiograph and chest CT June 04, 2013  FINDINGS: There is a comminuted fracture of the left clavicle as well as multiple displaced left-sided rib fractures. There is a small left base hydropneumothorax. There is no tension component. The small focus of subcutaneous air is again noted without progression. There is mild left base atelectasis. Elsewhere lungs are clear. Heart size and pulmonary vascularity are normal. No adenopathy.  IMPRESSION: Small left base hydropneumothorax tension component. There is left base atelectasis. There is minimal subcutaneous emphysema, not progressed from 1 day prior. Multiple rib fractures and left clavicle fracture on the left again noted. There old healed rib fractures on the right. Right lung is clear.   Electronically Signed   By: Lowella Grip M.D.   On: 06/05/2013 08:08   Dg Shoulder Left  06/04/2013   CLINICAL DATA:  Trauma and  pain.  EXAM: LEFT SHOULDER - 2+ VIEW  COMPARISON:  None.  FINDINGS: Mild degenerative irregularity of the acromioclavicular and glenohumeral joints. No acute fracture or dislocation about the shoulder. Upper left rib fractures which are detailed on dedicated radiographs.  IMPRESSION: Degenerative change without acute finding about the left shoulder.  Upper left rib fractures.  Please see dedicated radiographs.   Electronically Signed   By: Abigail Miyamoto M.D.   On: 06/04/2013 20:19    Microbiology: Recent Results (from the past 240 hour(s))  CULTURE, EXPECTORATED SPUTUM-ASSESSMENT     Status: None   Collection Time    06/17/13  6:42 AM      Result Value Ref Range Status   Specimen Description SPUTUM   Final   Special Requests Normal   Final   Sputum evaluation     Final   Value: THIS SPECIMEN IS ACCEPTABLE. RESPIRATORY CULTURE REPORT TO FOLLOW.   Report Status 06/17/2013 FINAL   Final  CULTURE, RESPIRATORY (NON-EXPECTORATED)     Status: None   Collection Time    06/17/13  6:42 AM      Result Value Ref Range Status   Specimen Description SPUTUM   Final   Special Requests NONE   Final   Gram Stain     Final   Value: MODERATE WBC PRESENT, PREDOMINANTLY PMN     FEW SQUAMOUS EPITHELIAL CELLS PRESENT     MODERATE GRAM POSITIVE COCCI IN PAIRS     FEW GRAM NEGATIVE RODS     Performed at [facility]   Culture     Final   Value: NORMAL OROPHARYNGEAL FLORA     Performed at [facility]   Report Status 06/19/2013 FINAL   Final     Labs: Basic Metabolic Panel:  Recent Labs Lab 06/18/13 0500 06/19/13 0500  NA 140 139  K 3.9 4.4  CL 105 101  CO2 26 28  GLUCOSE 108* 111*  BUN 12 11  CREATININE 0.56 0.58  CALCIUM 8.2* 8.7   Liver Function Tests:  Recent Labs Lab 06/15/13 1135  AST 67*  ALT 51  ALKPHOS 75  BILITOT 0.4  PROT 5.2*  ALBUMIN 2.3*   No results found for this basename: LIPASE, AMYLASE,  in the last 168 hours  Recent Labs Lab 06/15/13 1135   AMMONIA 19   CBC:  Recent Labs Lab 06/18/13 0500 06/19/13 0500  WBC 14.5* 8.9  HGB 10.4* 10.4*  HCT 31.2* 30.6*  MCV 96.0 97.8  PLT 412* 376  Cardiac Enzymes: No results found for this basename: CKTOTAL, CKMB, CKMBINDEX, TROPONINI,  in the last 168 hours BNP: BNP (last 3 results)  Recent Labs  06/10/13 0500  PROBNP 120.4   CBG:  Recent Labs Lab 06/21/13 1214 06/21/13 1732 06/21/13 2135 06/21/13 2212 06/22/13 0806  GLUCAP 126* 109* 159* 131* 100*    Active Problems:   Fall from ladder   Acute respiratory failure   Altered mental status   Pneumonia, organism unspecified   Hemothorax on left   Time coordinating discharge: <30 mins  Signed:  Galen Russman, ANP-BC

## 2013-06-22 NOTE — Progress Notes (Signed)
[name]       [name] [id number]             [phone]      11 Days Post-Op Procedure(s) (LRB): VIDEO BRONCHOSCOPY (N/A) VIDEO ASSISTED THORACOSCOPY (VATS)/THOROCOTOMY (Left) Subjective: Feeling better, breathing improving , using IS/flutter valve frequently  Objective: Vital signs in last 24 hours: Temp:  [98.3 F (36.8 C)] 98.3 F (36.8 C) (06/19 0528) Pulse Rate:  [73-102] 73 (06/19 0528) Cardiac Rhythm:  [-]  Resp:  [18-20] 20 (06/19 0528) BP: (119-148)/(74-88) 148/88 mmHg (06/19 0528) SpO2:  [96 %-100 %] 100 % (06/19 0528) Weight:  [149 lb 12.8 oz (67.949 kg)] 149 lb 12.8 oz (67.949 kg) (06/19 0528)  Hemodynamic parameters for last 24 hours:    Intake/Output from previous day: 06/18 0701 - 06/19 0700 In: 1050 [P.O.:970; I.V.:80] Out: 2325 [Urine:2325] Intake/Output this shift: Total I/O In: -  Out: 400 [Urine:400]  General appearance: alert, cooperative and no distress Heart: regular rate and rhythm Lungs: coarse, dim on left Abdomen: benign Extremities: no edema Wound: incis healing well  Lab Results: No results found for this basename: WBC, HGB, HCT, PLT,  in the last 72 hours BMET: No results found for this basename: NA, K, CL, CO2, GLUCOSE, BUN, CREATININE, CALCIUM,  in the last 72 hours  PT/INR: No results found for this basename: LABPROT, INR,  in the last 72 hours ABG    Component Value Date/Time   PHART 7.396 06/12/2013 0404   HCO3 27.8* 06/12/2013 0404   TCO2 29.3 06/12/2013 0404   O2SAT 98.3 06/12/2013 0404   CBG (last 3)   Recent Labs  06/21/13 2135 06/21/13 2212 06/22/13 0806  GLUCAP 159* 131* 100*   Scheduled Meds: . antiseptic oral rinse  15 mL Mouth Rinse BID  . ceFEPime (MAXIPIME) IV  2 g Intravenous Q12H  . feeding supplement (ENSURE COMPLETE)  237 mL Oral Daily  . folic acid  1 mg Oral Daily  . insulin aspart  0-15 Units Subcutaneous TID WC  . insulin aspart  0-5 Units Subcutaneous QHS  . nicotine   14 mg Transdermal Daily  . polyethylene glycol  17 g Per Tube Daily  . senna-docusate  1 tablet Oral QHS  . tamsulosin  0.4 mg Oral QPC supper  . vancomycin  1,000 mg Intravenous Q8H   Continuous Infusions: . dextrose 5 % and 0.45% NaCl Stopped (06/15/13 0845)   PRN Meds:.bisacodyl, calcium carbonate, diphenhydrAMINE, hydrALAZINE, ipratropium-albuterol, ipratropium-albuterol, LORazepam, metoprolol, morphine injection, ondansetron (ZOFRAN) IV, ondansetron (ZOFRAN) IV, ondansetron, oxyCODONE-acetaminophen, potassium chloride  Dg Chest 2 View  06/22/2013   CLINICAL DATA:  Hydro pneumothorax.  EXAM: CHEST  2 VIEW  COMPARISON:  Chest x-ray 06/21/2013.  FINDINGS: Mediastinum and hilar structures are normal. Left lower lobe infiltrates noted consistent with pneumonia. Left-sided pleural effusion is present. Air-fluid level noted in the left upper chest on prior chest x-ray no longer identified. Left clavicular fracture is again noted. Bilateral rib fractures are present.  IMPRESSION: 1. Left lower lobe infiltrate consistent with pneumonia. Left pleural effusion. Previously identified air-fluid level left chest no longer identified. 2. Left clavicular and bilateral rib fractures.  No pneumothorax.   Electronically Signed   By: Marcello Moores  Register   On: 06/22/2013 08:47   Dg Chest 2 View  06/21/2013   CLINICAL DATA:  Chest pain  EXAM: CHEST  2 VIEW  COMPARISON:  06/20/2013  FINDINGS: There again noted multiple left rib fractures as well  as a left clavicular fracture. There stable in appearance. Chronic changes in the left scapula are seen. There is been removal of left-sided chest tubes when compare with the prior exam. An air-fluid level is noted in the upper left hemi thorax consistent with a mild hydropneumothorax. Persistent increased density in the left base is noted. The right lung is well expanded. Healed rib fractures are noted on the right. The previously seen right-sided central venous line is been  removed in the interval.  IMPRESSION: Stable left rib fractures and left clavicular fracture.  Interval chest tube removal on the left.  There is a new air-fluid level in the upper chest on the left consistent with a small hydropneumothorax.  Persistent left basilar infiltrate   Electronically Signed   By: Inez Catalina M.D.   On: 06/21/2013 08:15    Assessment/Plan: S/P Procedure(s) (LRB): VIDEO BRONCHOSCOPY (N/A) VIDEO ASSISTED THORACOSCOPY (VATS)/THOROCOTOMY (Left)  1 good overall clinical progress 2 CXR fairly stable- cont abx, pulm toilet 3 push rehab as able     LOS: 18 days    GOLD,WAYNE E 06/22/2013

## 2013-06-23 ENCOUNTER — Inpatient Hospital Stay (HOSPITAL_COMMUNITY): Payer: Self-pay | Admitting: Rehabilitation

## 2013-06-23 ENCOUNTER — Inpatient Hospital Stay (HOSPITAL_COMMUNITY): Payer: Self-pay | Admitting: Occupational Therapy

## 2013-06-23 ENCOUNTER — Inpatient Hospital Stay (HOSPITAL_COMMUNITY): Payer: Self-pay | Admitting: *Deleted

## 2013-06-23 DIAGNOSIS — J90 Pleural effusion, not elsewhere classified: Secondary | ICD-10-CM

## 2013-06-23 DIAGNOSIS — J9600 Acute respiratory failure, unspecified whether with hypoxia or hypercapnia: Secondary | ICD-10-CM

## 2013-06-23 DIAGNOSIS — W11XXXA Fall on and from ladder, initial encounter: Secondary | ICD-10-CM

## 2013-06-23 DIAGNOSIS — S2239XA Fracture of one rib, unspecified side, initial encounter for closed fracture: Secondary | ICD-10-CM

## 2013-06-23 MED ORDER — LIDOCAINE 5 % EX PTCH
2.0000 | MEDICATED_PATCH | CUTANEOUS | Status: DC
  Administered 2013-06-23 – 2013-06-28 (×4): 2 via TRANSDERMAL
  Filled 2013-06-23 (×8): qty 2

## 2013-06-23 NOTE — Progress Notes (Signed)
Occupational Therapy Session Note  Patient Details  Name: VERL KITSON MRN: [id number] Date of Birth: [birth year]  Today's Date: 06/23/2013 Time: 1415-1500   (45 min)   Short Term Goals: Week 1:     Skilled Therapeutic Interventions/Progress Updates:   Engaged in tub transfers with step over and sit down technique.   Pt. Stepped over tub and used grab bar to sit on bottom of tub.  Pt had 1 LOB during this transition but he denied it.  Pt. Went from long sitting to standing using grab bar to pull up from.  Pt. adament that he did not lose his balance.  He shouted that OT made him loose his balance during the transfer.  Pt.rolled wc from room to ADL apartment with 4 rest breaks.  Ambulated with RW about 50 feet before needing to rest.  Ox= 91% On 2 liters, HR= 84.  Pt left in room in recliner with a fresh cup of coffee and call bell in reach.   Therapy Documentation Precautions:  Precautions Precautions: Fall;Other (comment) Precaution Comments: L clavicle fracture Required Braces or Orthoses: Other Brace/Splint Other Brace/Splint: Sling for L UE Restrictions Weight Bearing Restrictions: Yes LUE Weight Bearing: Non weight bearing General:   Vital Signs: SpO2: 99 % O2 Device: None (Room air) Pain:  9/10 left side   Other Treatments:    See FIM for current functional status  Therapy/Group: Individual Therapy  Lisa Roca 06/23/2013, 3:12 PM

## 2013-06-23 NOTE — Progress Notes (Signed)
ANTIBIOTIC CONSULT NOTE - FOLLOW UP  Pharmacy Consult for vancomycin and cefepime Indication: pneumonia  No Known Allergies  Patient Measurements: Height: 5\' 9"  (175.3 cm) Weight: 143 lb 8 oz (65.091 kg) IBW/kg (Calculated) : 70.7  Vital Signs: Temp: 98 F (36.7 C) (06/20 0528) Temp src: Oral (06/20 0528) BP: 163/99 mmHg (06/20 0528) Pulse Rate: 99 (06/20 0528) Intake/Output from previous day: 06/19 0701 - 06/20 0700 In: -  Out: 351 [Urine:351] Intake/Output from this shift:    Labs: No results found for this basename: WBC, HGB, PLT, LABCREA, CREATININE,  in the last 72 hours Estimated Creatinine Clearance: 99.5 ml/min (by C-G formula based on Cr of 0.58).  Recent Labs  06/20/13 1542  [place] 12.1     Assessment: 48 YOM s/p S/P VATS, drainage of hemothorax, decortication, repair of lung laceration, on vancomycin/cefepime (Day #8) for HCAP. Afebrile, leukocytosis resolved 6/16, renal function stable 6/16. Transferred to rehab 6/19.  Vanc 6/3 >>6/10; restarted 6/14>> 6/6 Vanc Trough < 5, increased to 750mg  q8h  6/17 Vanc Trough = 12.1, increased to 1gm IV q8h Zosyn 6/3 >>6/10 Cefepime 6/14>>  6/14: Sputum: normal flora 6/8: Pleural fluid: negative 6/4 BAL - negative 6/3 TA - non-pathogenic flora 6/2 MRSA screen eng  Goal of Therapy:  Vancomycin trough level 15-20 mcg/ml  Plan:  - Vanc 1g q8. Since appears this will continue, will check trough 6/21 a.m. - Cefepime 2g q12 - F/u LOT with abx - consider d/c as pt has received 8-days of therapy.  Sherlon Handing, PharmD, BCPS Clinical pharmacist, pager [phone]  06/23/2013,9:24 AM

## 2013-06-23 NOTE — Evaluation (Signed)
Physical Therapy Assessment and Plan  Patient Details  Name: James [name] MRN: [id number] Date of Birth: [birth year]  PT Diagnosis: Abnormality of gait and Pain in L shoulder Rehab Potential: Excellent ELOS: 3-5 days    Today's Date: 06/23/2013 Time: 0800-0858 Time Calculation (min): 58 min  Problem List:  Patient Active Problem List   Diagnosis Date Noted  . Trauma of chest 06/22/2013  . Hemothorax on left 06/11/2013  . Acute respiratory failure 06/08/2013  . Altered mental status 06/08/2013  . Pneumonia, organism unspecified 06/08/2013  . Fall from ladder 06/05/2013    Past Medical History:  Past Medical History  Diagnosis Date  . Pneumothorax    Past Surgical History:  Past Surgical History  Procedure Laterality Date  . Video bronchoscopy N/A 06/11/2013    Procedure: VIDEO BRONCHOSCOPY;  Surgeon: Ivin Poot, MD;  Location: [facility] OR;  Service: Thoracic;  Laterality: N/A;  . Video assisted thoracoscopy (vats)/thorocotomy Left 06/11/2013    Procedure: VIDEO ASSISTED THORACOSCOPY (VATS)/THOROCOTOMY;  Surgeon: Ivin Poot, MD;  Location: [facility] OR;  Service: Thoracic;  Laterality: Left;    Assessment & Plan Clinical Impression: Patient is a 53 y.o. year old male admitted 06/05/2013 after a fall of approximately 6 feet from a ladder--no loss of consciousness reported. Cranial CT and CT cervical spine negative. Other work up revealed left comminuted clavicle fracture, multiple left-sided rib fractures with flail chest, occult loculated pneumothorax and small left hemothorax. He underwent VATS for mini thoracotomy with drainage of hemothorax and decortication of LLL by Dr. Prescott Gum on 06/11/13. s Dr. Rolena Infante recommended conservative care with figure 8 sling and NWB LUE for left clavicle fracture. Hospital course complicated by respiratory decline requiring intubation from 06/07/2013 to 06/13/2013. He had required in and out caths for urinary retention. ABLA being monitored with  Hgb-10.4. He developed confusion with hallucinations due to ETOH withdrawal on 06/15/13 and placed on precedex and ativan. Follow up CT head without acute abnormality. Antibiotics--Vanc/Cefepime resumed on 06/17/13 due to leucocytosis with fever and worsening of LLL effusion question due to HAP. Last two left chest tubes removed on 06/20/13. Sputum culture with normal flora but follow up CXR with LLE PNA. Patient to continue IS/flutter valve and reports breathing is improving. Mentation has improved but patient deconditioned with balance deficits. Rehab team and MD recommending CIR and patient admitted today.  Patient transferred to CIR on 06/22/2013 .   Patient currently requires min with mobility secondary to decreased cardiorespiratoy endurance and decreased oxygen support and decreased balance.  Prior to hospitalization, patient was independent  with mobility and lived with Family in a House home.  Home access is 5-6Stairs to enter.  Patient will benefit from skilled PT intervention to maximize safe functional mobility and minimize fall risk for planned discharge home with intermittent S from sister.  Anticipate patient will benefit from follow up OP at discharge.  PT - End of Session Activity Tolerance: Tolerates 30+ min activity with multiple rests Endurance Deficit: Yes Endurance Deficit Description: Pt with decreased endurance during eval and requires seated rest breaks intermittently PT Assessment Rehab Potential: Excellent PT Patient demonstrates impairments in the following area(s): Balance;Endurance;Safety PT Transfers Functional Problem(s): Bed Mobility;Bed to Chair;Car;Furniture;Floor PT Locomotion Functional Problem(s): Ambulation;Stairs PT Plan PT Intensity: Minimum of 1-2 x/day ,45 to 90 minutes PT Frequency: 5 out of 7 days PT Duration Estimated Length of Stay: 3-5 days  PT Treatment/Interventions: Ambulation/gait training;Balance/vestibular training;Discharge  planning;DME/adaptive equipment instruction;Functional mobility training;Patient/family education;Stair training;Therapeutic Activities;Therapeutic Exercise PT Transfers  Anticipated Outcome(s): Mod I  PT Locomotion Anticipated Outcome(s): Mod I  PT Recommendation Follow Up Recommendations: Outpatient PT Patient destination: Home Equipment Recommended: To be determined  Skilled Therapeutic Intervention PT assessment and evaluation performed, see details below.  Initiated gait training without device and also with use of SPC.  Requires close S to min/guard for occasional LOB.  Note that he tends to lose balance with head turns during gait.  Also performed BERG balance test with score of 44/56.  Discussed results and need for continued balance training.  Discussed ELOS being approx 3-5 days and expected outcomes of Mod I level.  Pt verbalized understanding and just "wants to be back to normal."  Pt left in recliner in room, all needs in reach.  Discussed calling for S/assist when needing to get up.  Pt verbalized understanding.   PT Evaluation Precautions/Restrictions Precautions Precautions: Fall;Other (comment) Precaution Comments: L clavicle fracture Required Braces or Orthoses: Other Brace/Splint Other Brace/Splint: Sling for L UE Restrictions Weight Bearing Restrictions: Yes LUE Weight Bearing: Non weight bearing General Chart Reviewed: Yes Family/Caregiver Present: No Vital Signs  Pain Pain Assessment Pain Score: 2  Pain Type: Acute pain Pain Location: Rib cage Pain Descriptors / Indicators: Aching Pain Intervention(s): Medication (See eMAR) Home Living/Prior Functioning Home Living Available Help at Discharge: Family;Friend(s) Type of Home: House Home Access: Stairs to enter Technical brewer of Steps: 5-6 Entrance Stairs-Rails: Right;Left Home Layout: One level Additional Comments: Pt states he may stay with sister post d/c  Lives With: Family Prior Function Level  of Independence: Independent with basic ADLs;Independent with gait;Independent with transfers  Able to Take Stairs?: Yes Driving: Yes Comments: works as Company secretary - History Baseline Vision: No visual deficits Patient Visual Report: No change from baseline Vision - Assessment Eye Alignment: Within Functional Limits Vision Assessment: Vision tested Perception Perception: Within Functional Limits  Cognition Overall Cognitive Status: Within Functional Limits for tasks assessed Arousal/Alertness: Awake/alert Orientation Level: Oriented X4 Attention: Alternating Alternating Attention: Appears intact Memory: Impaired Memory Impairment: Decreased recall of new information Awareness: Appears intact Safety/Judgment: Appears intact Comments: Pt verbalized understanding that he needs to call for assist when getting up in room.   Sensation Sensation Light Touch: Appears Intact Stereognosis: Not tested Hot/Cold: Not tested Proprioception: Appears Intact Coordination Gross Motor Movements are Fluid and Coordinated: Yes Fine Motor Movements are Fluid and Coordinated: Yes Heel Shin Test: [facility] Motor  Motor Motor: Other (comment) Motor - Skilled Clinical Observations: Pt with higher level balance deficits with L clavicle fx, requiring L arm to be splinted and NWB  Mobility Bed Mobility Bed Mobility: Supine to Sit Supine to Sit: 5: Supervision;HOB flat Supine to Sit Details: Verbal cues for precautions/safety Transfers Transfers: Yes Sit to Stand: 4: Min guard Stand to Sit: 4: Min guard Stand Pivot Transfers: 4: Min guard Locomotion  Ambulation Ambulation: Yes Ambulation/Gait Assistance: 4: Min guard;4: Min assist Ambulation Distance (Feet): 200 Feet Assistive device: Straight cane (no AD and then with SPC) Gait Gait: Yes Gait Pattern: Impaired Gait Pattern: Trunk flexed;Narrow base of support;Decreased stride length Stairs / Additional  Locomotion Stairs: Yes Stairs Assistance: 4: Min guard Stair Management Technique: One rail Right;Alternating pattern;Forwards Number of Stairs: 5 Height of Stairs: 4 Architect: Yes Wheelchair Assistance: 5: Investment banker, operational Details: Verbal cues for Astronomer: Both lower extermities;Right upper extremity Wheelchair Parts Management: Supervision/cueing Distance: 150  Trunk/Postural Assessment  Cervical Assessment Cervical Assessment: Within Functional Limits Thoracic Assessment Thoracic Assessment: Within  Functional Limits Lumbar Assessment Lumbar Assessment: Within Functional Limits Postural Control Postural Control: Within Functional Limits  Balance Balance Balance Assessed: Yes Standardized Balance Assessment Standardized Balance Assessment: Berg Balance Test Berg Balance Test Sit to Stand: Able to stand  independently using hands Standing Unsupported: Able to stand safely 2 minutes Sitting with Back Unsupported but Feet Supported on Floor or Stool: Able to sit safely and securely 2 minutes Stand to Sit: Controls descent by using hands Transfers: Able to transfer safely, definite need of hands Standing Unsupported with Eyes Closed: Able to stand 10 seconds with supervision Standing Ubsupported with Feet Together: Able to place feet together independently and stand for 1 minute with supervision From Standing, Reach Forward with Outstretched Arm: Can reach forward >12 cm safely (5") From Standing Position, Pick up Object from Floor: Able to pick up shoe safely and easily From Standing Position, Turn to Look Behind Over each Shoulder: Looks behind from both sides and weight shifts well Turn 360 Degrees: Able to turn 360 degrees safely but slowly Standing Unsupported, Alternately Place Feet on Step/Stool: Able to complete 4 steps without aid or supervision Standing Unsupported, One Foot in Front: Able to plae  foot ahead of the other independently and hold 30 seconds Standing on One Leg: Able to lift leg independently and hold 5-10 seconds Total Score: 44 Dynamic Sitting Balance Sitting balance - Comments: Pt able to sit short periods of time with no support while combing his hair, but would not tolerate further challenge to his balance. Dynamic Standing Balance Dynamic Standing - Level of Assistance: 5: Stand by assistance Extremity Assessment  RUE Assessment RUE Assessment: Within Functional Limits LUE Assessment LUE Assessment: Within Functional Limits RLE Assessment RLE Assessment: Within Functional Limits LLE Assessment LLE Assessment: Within Functional Limits  FIM:  FIM - Bed/Chair Transfer Bed/Chair Transfer: 5: Supine > Sit: Supervision (verbal cues/safety issues);4: Bed > Chair or W/C: Min A (steadying Pt. > 75%) FIM - Locomotion: Wheelchair Distance: 150 Locomotion: Wheelchair: 5: Travels 150 ft or more: maneuvers on rugs and over door sills with supervision, cueing or coaxing FIM - Locomotion: Ambulation Locomotion: Ambulation Assistive Devices:  (no AD) Ambulation/Gait Assistance: 4: Min guard;4: Min assist Locomotion: Ambulation: 4: Travels 150 ft or more with minimal assistance (Pt.>75%) FIM - Locomotion: Stairs Locomotion: Scientist, physiological: Hand rail - 1 Locomotion: Stairs: 2: Up and Down 4 - 11 stairs with minimal assistance (Pt.>75%)   Refer to Care Plan for Long Term Goals  Recommendations for other services: None  Discharge Criteria: Patient will be discharged from PT if patient refuses treatment 3 consecutive times without medical reason, if treatment goals not met, if there is a change in medical status, if patient makes no progress towards goals or if patient is discharged from hospital.  The above assessment, treatment plan, treatment alternatives and goals were discussed and mutually agreed upon: by patient  Denice Bors 06/23/2013, 12:02 PM

## 2013-06-23 NOTE — Progress Notes (Signed)
[place] PHYSICAL MEDICINE & REHABILITATION     PROGRESS NOTE    Subjective/Complaints: Had a reasonable night. Pain not bad at rest. Drinking his morning coffee A  review of systems has been performed and if not noted above is otherwise negative.   Objective: Vital Signs: Blood pressure 163/99, pulse 99, temperature 98 F (36.7 C), temperature source Oral, resp. rate 18, height 5\' 9"  (1.753 m), weight 65.091 kg (143 lb 8 oz), SpO2 98.00%. Dg Chest 2 View  06/22/2013   CLINICAL DATA:  Hydro pneumothorax.  EXAM: CHEST  2 VIEW  COMPARISON:  Chest x-ray 06/21/2013.  FINDINGS: Mediastinum and hilar structures are normal. Left lower lobe infiltrates noted consistent with pneumonia. Left-sided pleural effusion is present. Air-fluid level noted in the left upper chest on prior chest x-ray no longer identified. Left clavicular fracture is again noted. Bilateral rib fractures are present.  IMPRESSION: 1. Left lower lobe infiltrate consistent with pneumonia. Left pleural effusion. Previously identified air-fluid level left chest no longer identified. 2. Left clavicular and bilateral rib fractures.  No pneumothorax.   Electronically Signed   By: Marcello Moores  Register   On: 06/22/2013 08:47   No results found for this basename: WBC, HGB, HCT, PLT,  in the last 72 hours No results found for this basename: NA, K, CL, CO, GLUCOSE, BUN, CREATININE, CALCIUM,  in the last 72 hours CBG (last 3)   Recent Labs  06/22/13 0806 06/22/13 1219 06/22/13 1718  GLUCAP 100* 122* 119*    Wt Readings from Last 3 Encounters:  06/23/13 65.091 kg (143 lb 8 oz)  06/22/13 67.949 kg (149 lb 12.8 oz)  06/22/13 67.949 kg (149 lb 12.8 oz)    Physical Exam:  Constitutional: He is oriented to person, place, and time. He appears well-developed.  HENT:  Head: Normocephalic.  Eyes: EOM are normal.  Neck: Normal range of motion. Neck supple. No thyromegaly present.  Cardiovascular: Normal rate and regular rhythm.   Respiratory:  Decreased breath sounds at the bases but clear to auscultation   GI: Soft. Bowel sounds are normal. He exhibits no distension.  Neurological: He is alert and oriented to person, place, and time. No cranial nerve deficit. Coordination normal.  Moves all 4s but limited by pain especially in UE's. Sensation appears grossly intact.  Skin: Skin is warm and dry.  VATS site clean and dry  Psychiatric: He has a normal mood and affect. His behavior is normal   Assessment/Plan: 1. Functional deficits secondary to polytrauma after fall which require 3+ hours per day of interdisciplinary therapy in a comprehensive inpatient rehab setting. Physiatrist is providing close team supervision and 24 hour management of active medical problems listed below. Physiatrist and rehab team continue to assess barriers to discharge/monitor patient progress toward functional and medical goals. FIM:                      Expression Expression Mode: Verbal Expression: 7-Expresses complex ideas: With no assist  Social Interaction Social Interaction: 7-Interacts appropriately with others - No medications needed.  Problem Solving Problem Solving: 6-Solves complex problems: With extra time  Memory Memory: 7-Complete Independence: No helper  Medical Problem List and Plan:  1. Functional deficits secondary to left clavicle, rib fxs, ptx and flail chest after fall  2. DVT Prophylaxis/Anticoagulation: Mechanical: Sequential compression devices, below knee Bilateral lower extremities  3. Pain Management: Will continue prn oxycodone.   -add lidoderm patches for rib pain 4. Mood: Stable. Will have LCSW  follow for evaluation and support.  5. Neuropsych: This patient is capable of making decisions on her own behalf.  6. ABLA: Will add iron supplement  7. Pneumothorax s/p VATS: Continue IS with flutter valve. On duonebs tid.  8. Left Clavicle fracture: Is to be NWB LUE with figure 8 splint for  support.  9. LLL PNA: Reactive leucocytosis resolving. Continue Maxipime and Vancomycin for now. Follow up CXR on 06/22 and monitor WBC/temperature trends.  10 Malnutrition:   protein supplement. Push po  LOS (Days) 1 A FACE TO FACE EVALUATION WAS PERFORMED  SWARTZ,ZACHARY T 06/23/2013 8:14 AM

## 2013-06-23 NOTE — Progress Notes (Signed)
Physical Therapy Session Note  Patient Details  Name: James [name] MRN: [id number] Date of Birth: [birth year]  Today's Date: 06/23/2013 Time: 1302-1330 Time Calculation (min): 28 min  Short Term Goals: Week 1:  PT Short Term Goal 1 (Week 1): =LTG's due to ELOS  Skilled Therapeutic Interventions/Progress Updates:  Pt received sitting in recliner in room, agreeable to therapy, stating "I want to walk."  Skilled session focused on high level gait with head turns side/side and up/down, standing on compliant foam pad with feet apart>feet together>EO and EC x 30 secs each, tapping to cones alternating BLE, and continued high level gait with side stepping and retro stepping.  Requires close S to min/guard for all activities with no overt LOB, however did note that horizontal head turns more difficult than vertical and pt relies mainly on somatosensory and visual feedback during standing foam activity.  Pt ambulated back to room and left in recliner with all needs in reach.   Therapy Documentation Precautions:  Precautions Precautions: Fall;Other (comment) Precaution Comments: L clavicle fracture Required Braces or Orthoses: Other Brace/Splint Other Brace/Splint: Sling for L UE Restrictions Weight Bearing Restrictions: Yes LUE Weight Bearing: Non weight bearing   Vital Signs: Therapy Vitals Temp: 97.9 F (36.6 C) Temp src: Oral Pulse Rate: 88 Resp: 18 BP: 118/66 mmHg Patient Position (if appropriate): Sitting Oxygen Therapy SpO2: 99 % O2 Device: None (Room air) Pain: Pt with mild c/o pain, however had 30 more mins until time for more meds See FIM for current functional status  Therapy/Group: Individual Therapy  Denice Bors 06/23/2013, 2:59 PM

## 2013-06-23 NOTE — Evaluation (Addendum)
Occupational Therapy Assessment and Plan  Patient Details  Name: James [name] MRN: [id number] Date of Birth: 05-Sep-1960  OT Diagnosis: acute pain Rehab Potential: Rehab Potential: Good ELOS: 5 days   Today's Date: 06/23/2013 Time:  0900-1000  (60 min) Time Calculation (min): 60 min  Problem List:  Patient Active Problem List   Diagnosis Date Noted  . Trauma of chest 06/22/2013  . Hemothorax on left 06/11/2013  . Acute respiratory failure 06/08/2013  . Altered mental status 06/08/2013  . Pneumonia, organism unspecified 06/08/2013  . Fall from ladder 06/05/2013    Past Medical History:  Past Medical History  Diagnosis Date  . Pneumothorax    Past Surgical History:  Past Surgical History  Procedure Laterality Date  . Video bronchoscopy N/A 06/11/2013    Procedure: VIDEO BRONCHOSCOPY;  Surgeon: Ivin Poot, MD;  Location: [place];  Service: Thoracic;  Laterality: N/A;  . Video assisted thoracoscopy (vats)/thorocotomy Left 06/11/2013    Procedure: VIDEO ASSISTED THORACOSCOPY (VATS)/THOROCOTOMY;  Surgeon: Ivin Poot, MD;  Location: [place];  Service: Thoracic;  Laterality: Left;    Assessment & Plan Clinical Impression: James [name] is a 53 y.o. right-handed male admitted 06/05/2013 after a fall of approximately 6 feet from a ladder--no loss of consciousness reported. Cranial CT and CT cervical spine negative. Other work up revealed left comminuted clavicle fracture, multiple left-sided rib fractures with flail chest, occult loculated pneumothorax and small left hemothorax. He underwent VATS for mini thoracotomy with drainage of hemothorax and decortication of LLL by Dr. Prescott Gum on 06/11/13. s Dr. Rolena Infante recommended conservative care with figure 8 sling and NWB LUE for left clavicle fracture. Hospital course complicated by respiratory decline requiring intubation from 06/07/2013 to 06/13/2013. He had required in and out caths for urinary retention. ABLA being monitored with  Hgb-10.4. He developed confusion with hallucinations due to ETOH withdrawal on 06/15/13 and placed on precedex and ativan. Follow up CT head without acute abnormality. Antibiotics--Vanc/Cefepime resumed on 06/17/13 due to leucocytosis with fever and worsening of LLL effusion question due to HAP. Last two left chest tubes removed on 06/20/13. Sputum culture with normal flora but follow up CXR with LLE PNA. Patient to continue IS/flutter valve and reports breathing is improving. Mentation has improved but patient deconditioned with balance deficits   Patient transferred to CIR on 06/22/2013 .    Patient currently requires min with basic self-care skills secondary to muscle weakness and muscle joint tightness and decreased cardiorespiratoy endurance.  Prior to hospitalization, patient could complete BADL with independent .  Patient will benefit from skilled intervention to increase independence with basic self-care skills and increase level of independence with iADL prior to discharge home with care partner.  Anticipate patient will require intermittent supervision and follow up home health.  OT - End of Session Activity Tolerance: Tolerates 30+ min activity without fatigue Endurance Deficit: Yes OT Assessment Rehab Potential: Good Barriers to Discharge: Decreased caregiver support OT Patient demonstrates impairments in the following area(s): Balance;Cognition;Endurance;Motor;Pain;Perception;Safety OT Basic ADL's Functional Problem(s): Grooming;Bathing;Dressing;Toileting OT Advanced ADL's Functional Problem(s): Simple Meal Preparation;Laundry;Light Housekeeping OT Transfers Functional Problem(s): Toilet;Tub/Shower OT Plan OT Intensity: Minimum of 1-2 x/day, 45 to 90 minutes OT Frequency: 5 out of 7 days OT Duration/Estimated Length of Stay: 5-7 days OT Treatment/Interventions: Balance/vestibular training;Community reintegration;Discharge planning;DME/adaptive equipment instruction;Functional  mobility training;Pain management;Patient/family education;Self Care/advanced ADL retraining;Therapeutic Activities;Therapeutic Exercise;UE/LE Strength taining/ROM OT Self Feeding Anticipated Outcome(s): independent OT Basic Self-Care Anticipated Outcome(s): mod i OT Toileting Anticipated Outcome(s): mod i  OT Bathroom Transfers Anticipated Outcome(s): mod i with toilet and Supervision with tub OT Recommendation Patient destination: Home Follow Up Recommendations: Home health OT Equipment Recommended: Tub/shower seat   Skilled Therapeutic Intervention Addressed transfers, functional mobility,  POC and OT goals.    OT Evaluation Precautions/Restrictions  Precautions Precautions: Fall;Other (comment) Precaution Comments: L clavicle fracture Required Braces or Orthoses: Other Brace/Splint Other Brace/Splint: Sling for L UE Restrictions Weight Bearing Restrictions: Yes LUE Weight Bearing: Non weight bearing  Therapy Vitals TSpO2: 92 % O2 Device: None (Room air) Pain Pain Assessment Pain Assessment: 0-10 Pain Score: 9 Pain Type: Acute pain Pain Location: Rib cage Pain Descriptors / Indicators: Aching Pain Intervention(s): RN made aware (pt premedicated) Home Living/Prior Functioning Home Living Available Help at Discharge: Family;Friend(s) Type of Home: House Home Access: Stairs to enter Technical brewer of Steps: 5-6 Entrance Stairs-Rails: Right;Left Home Layout: One level Additional Comments: Pt states he may stay with sister post d/c  Lives With: Family IADL History Homemaking Responsibilities: Yes Meal Prep Responsibility: Primary Laundry Responsibility: Primary Cleaning Responsibility: Primary Bill Paying/Finance Responsibility: Primary Shopping Responsibility: Primary Current License: Yes Mode of Transportation: Car Occupation:  (odd jobs ) Type of Occupation: Designer, industrial/product, yard work  Leisure and Hobbies:  (football on Recruitment consultant) Prior Function Level of  Independence: Independent with basic ADLs;Independent with gait;Independent with transfers  Able to Take Stairs?: Yes Driving: Yes Comments: works as TEFL teacher ADL   Vision/Perception  Vision- Assessment Eye Alignment: Within Designer, television/film set Perception: Within Patent examiner Light Touch: Appears Intact Stereognosis: Not tested Hot/Cold: Not tested Proprioception: Appears Intact Coordination Gross Motor Movements are Fluid and Coordinated: Yes Fine Motor Movements are Fluid and Coordinated: Yes Heel Shin Test: [facility] Motor    Mobility :  Ambulates with minimal assist and no assistive device    Trunk/Postural Assessment  Cervical Assessment Cervical Assessment: Within Functional Limits Thoracic Assessment Thoracic Assessment: Within Functional Limits Lumbar Assessment Lumbar Assessment: Within Functional Limits Postural Control Postural Control: Within Functional Limits  Balance Balance Balance Assessed: Yes Standardized Balance Assessment Standardized Balance Assessment: Berg Balance Test Berg Balance Test Sit to Stand: Able to stand  independently using hands Standing Unsupported: Able to stand safely 2 minutes Sitting with Back Unsupported but Feet Supported on Floor or Stool: Able to sit safely and securely 2 minutes Stand to Sit: Controls descent by using hands Transfers: Able to transfer safely, definite need of hands Standing Unsupported with Eyes Closed: Able to stand 10 seconds with supervision Standing Ubsupported with Feet Together: Able to place feet together independently and stand for 1 minute with supervision From Standing, Reach Forward with Outstretched Arm: Can reach forward >12 cm safely (5") From Standing Position, Pick up Object from Floor: Able to pick up shoe safely and easily From Standing Position, Turn to Look Behind Over each Shoulder: Looks behind from both sides and weight shifts well Turn  360 Degrees: Able to turn 360 degrees safely but slowly Standing Unsupported, Alternately Place Feet on Step/Stool: Able to complete 4 steps without aid or supervision Standing Unsupported, One Foot in Front: Able to plae foot ahead of the other independently and hold 30 seconds Standing on One Leg: Able to lift leg independently and hold 5-10 seconds Total Score: 44 Dynamic Sitting Balance Sitting balance - Comments: Pt able to sit short periods of time with no support while combing his hair, but would not tolerate further challenge to his balance. Dynamic Standing Balance Dynamic Standing - Level  of Assistance: 5: Stand by assistance Extremity/Trunk Assessment RUE Assessment RUE Assessment: Within Functional Limits LUE Assessment LUE Assessment:not tested; NWB; can use left hand for minimally challenging activities assist  FIM: see FIM     Refer to Care Plan for Long Term Goals  Recommendations for other services: None  Discharge Criteria: Patient will be discharged from OT if patient refuses treatment 3 consecutive times without medical reason, if treatment goals not met, if there is a change in medical status, if patient makes no progress towards goals or if patient is discharged from hospital.  The above assessment, treatment plan, treatment alternatives and goals were discussed and mutually agreed upon: by patient  Lisa Roca 06/23/2013, 5:05 PM

## 2013-06-24 ENCOUNTER — Inpatient Hospital Stay (HOSPITAL_COMMUNITY): Payer: Self-pay | Admitting: *Deleted

## 2013-06-24 DIAGNOSIS — S2239XA Fracture of one rib, unspecified side, initial encounter for closed fracture: Secondary | ICD-10-CM

## 2013-06-24 DIAGNOSIS — J9600 Acute respiratory failure, unspecified whether with hypoxia or hypercapnia: Secondary | ICD-10-CM

## 2013-06-24 DIAGNOSIS — W11XXXA Fall on and from ladder, initial encounter: Secondary | ICD-10-CM

## 2013-06-24 DIAGNOSIS — J90 Pleural effusion, not elsewhere classified: Secondary | ICD-10-CM

## 2013-06-24 LAB — VANCOMYCIN, TROUGH: Vancomycin Tr: 18.1 ug/mL (ref 10.0–20.0)

## 2013-06-24 NOTE — Progress Notes (Signed)
ANTIBIOTIC CONSULT NOTE - FOLLOW UP  Pharmacy Consult for vancomycin and cefepime Indication: pneumonia  No Known Allergies  Patient Measurements: Height: 5\' 9"  (175.3 cm) Weight: 142 lb 14.4 oz (64.819 kg) IBW/kg (Calculated) : 70.7  Vital Signs: Temp: 97.6 F (36.4 C) (06/21 0525) Temp src: Oral (06/21 0525) BP: 108/55 mmHg (06/21 0525) Pulse Rate: 88 (06/21 0525) Intake/Output from previous day: 06/20 0701 - 06/21 0700 In: 1080 [P.O.:1080] Out: -  Intake/Output from this shift: Total I/O In: 600 [P.O.:600] Out: -   Labs: No results found for this basename: WBC, HGB, PLT, LABCREA, CREATININE,  in the last 72 hours Estimated Creatinine Clearance: 99 ml/min (by C-G formula based on Cr of 0.58).  Recent Labs  06/24/13 0825  VANCOTROUGH 18.1     Assessment: 28 YOM s/p S/P VATS, drainage of hemothorax, decortication, repair of lung laceration, on vancomycin/cefepime (Day #9) for HCAP. Afebrile, leukocytosis resolved 6/16, renal function remains stable. Transferred to rehab 6/19. Vancomycin trough therapeutic on 1gm IV q8h. Noted plan for CXR on 6/22 - hopefully can d/c abx at this time.  Vanc 6/3 >>6/10; restarted 6/14>> 6/6 Vanc Trough < 5, increased to 750mg  q8h  6/17 Vanc Trough = 12.1, increased to 1gm IV q8h 6/21 Vanc Trough = 18.1, continue 1 gm IV q8h Zosyn 6/3 >>6/10 Cefepime 6/14>>  6/14: Sputum: normal flora 6/8: Pleural fluid: negative 6/4 BAL - negative 6/3 TA - non-pathogenic flora 6/2 MRSA screen eng  Goal of Therapy:  Vancomycin trough level 15-20 mcg/ml  Plan:  - Continue Vancomycin 1g IV q8h. - Continue Cefepime 2g IV q12g - F/u LOT with abx - consider d/c as pt has received 9-days of therapy.  Sherlon Handing, PharmD, BCPS Clinical pharmacist, pager [phone]  06/24/2013,9:48 AM

## 2013-06-24 NOTE — Progress Notes (Signed)
[place] PHYSICAL MEDICINE & REHABILITATION     PROGRESS NOTE    Subjective/Complaints: Had a good day with therapies. Really likes lidoderm patches.  A  review of systems has been performed and if not noted above is otherwise negative.   Objective: Vital Signs: Blood pressure 108/55, pulse 88, temperature 97.6 F (36.4 C), temperature source Oral, resp. rate 18, height 5\' 9"  (1.753 m), weight 64.819 kg (142 lb 14.4 oz), SpO2 96.00%. Dg Chest 2 View  06/22/2013   CLINICAL DATA:  Hydro pneumothorax.  EXAM: CHEST  2 VIEW  COMPARISON:  Chest x-ray 06/21/2013.  FINDINGS: Mediastinum and hilar structures are normal. Left lower lobe infiltrates noted consistent with pneumonia. Left-sided pleural effusion is present. Air-fluid level noted in the left upper chest on prior chest x-ray no longer identified. Left clavicular fracture is again noted. Bilateral rib fractures are present.  IMPRESSION: 1. Left lower lobe infiltrate consistent with pneumonia. Left pleural effusion. Previously identified air-fluid level left chest no longer identified. 2. Left clavicular and bilateral rib fractures.  No pneumothorax.   Electronically Signed   By: Marcello Moores  Register   On: 06/22/2013 08:47   No results found for this basename: WBC, HGB, HCT, PLT,  in the last 72 hours No results found for this basename: NA, K, CL, CO, GLUCOSE, BUN, CREATININE, CALCIUM,  in the last 72 hours CBG (last 3)   Recent Labs  06/22/13 0806 06/22/13 1219 06/22/13 1718  GLUCAP 100* 122* 119*    Wt Readings from Last 3 Encounters:  06/24/13 64.819 kg (142 lb 14.4 oz)  06/22/13 67.949 kg (149 lb 12.8 oz)  06/22/13 67.949 kg (149 lb 12.8 oz)    Physical Exam:  Constitutional: He is oriented to person, place, and time. He appears well-developed.  HENT:  Head: Normocephalic.  Eyes: EOM are normal.  Neck: Normal range of motion. Neck supple. No thyromegaly present.  Cardiovascular: Normal rate and regular rhythm.   Respiratory:  Decreased breath sounds at the bases    GI: Soft. Bowel sounds are normal. He exhibits no distension.  Neurological: He is alert and oriented to person, place, and time. No cranial nerve deficit. Coordination normal.  Moves all 4s . Sensation appears grossly intact.  Skin: Skin is warm and dry.  VATS site clean and dry  Psychiatric: He has a normal mood and affect. His behavior is normal   Assessment/Plan: 1. Functional deficits secondary to polytrauma after fall which require 3+ hours per day of interdisciplinary therapy in a comprehensive inpatient rehab setting. Physiatrist is providing close team supervision and 24 hour management of active medical problems listed below. Physiatrist and rehab team continue to assess barriers to discharge/monitor patient progress toward functional and medical goals. FIM: FIM - Bathing Bathing Steps Patient Completed: Left Arm Bathing: 2: Max-Patient completes 3-4 8f 10 parts or 25-49%  FIM - Upper Body Dressing/Undressing Upper body dressing/undressing: 0: Activity did not occur FIM - Lower Body Dressing/Undressing Lower body dressing/undressing: 0: Activity did not occur  FIM - Toileting Toileting: 0: Activity did not occur  FIM - Air cabin crew Transfers: 5-To toilet/BSC: Supervision (verbal cues/safety issues);5-From toilet/BSC: Supervision (verbal cues/safety issues)  FIM - Engineer, site Assistive Devices: Arm rests Bed/Chair Transfer: 5: Supine > Sit: Supervision (verbal cues/safety issues);4: Bed > Chair or W/C: Min A (steadying Pt. > 75%)  FIM - Locomotion: Wheelchair Distance: 150 Locomotion: Wheelchair: 5: Travels 150 ft or more: maneuvers on rugs and over door sills with supervision, cueing  or coaxing FIM - Locomotion: Ambulation Locomotion: Ambulation Assistive Devices:  (no AD) Ambulation/Gait Assistance: 4: Min guard;4: Min assist Locomotion: Ambulation: 4: Travels 150 ft or more  with minimal assistance (Pt.>75%)  Comprehension Comprehension Mode: Auditory Comprehension: 5-Understands complex 90% of the time/Cues < 10% of the time  Expression Expression Mode: Verbal Expression: 5-Expresses complex 90% of the time/cues < 10% of the time  Social Interaction Social Interaction: 7-Interacts appropriately with others - No medications needed.  Problem Solving Problem Solving: 6-Solves complex problems: With extra time  Memory Memory: 7-Complete Independence: No helper  Medical Problem List and Plan:  1. Functional deficits secondary to left clavicle, rib fxs, ptx and flail chest after fall  2. DVT Prophylaxis/Anticoagulation: Mechanical: Sequential compression devices, below knee Bilateral lower extremities  3. Pain Management: Will continue prn oxycodone.   - lidoderm patches for rib pain--discussed with patient the $$ of these --may try "icy hot" patches after dc 4. Mood: Stable. Will have LCSW follow for evaluation and support.  5. Neuropsych: This patient is capable of making decisions on her own behalf.  6. ABLA: Will add iron supplement  7. Pneumothorax s/p VATS: Continue IS with flutter valve. On duonebs tid.  8. Left Clavicle fracture: Is to be NWB LUE with figure 8 splint for support.  9. LLL PNA: Reactive leucocytosis resolving. Continue Maxipime and Vancomycin for now. Follow up CXR on 06/22  -afebrile at present 10 Malnutrition:   protein supplement. Push po  LOS (Days) 2 A FACE TO FACE EVALUATION WAS PERFORMED  SWARTZ,ZACHARY T 06/24/2013 8:06 AM

## 2013-06-24 NOTE — Progress Notes (Signed)
Occupational Therapy Session Note  Patient Details  Name: James [name] MRN: [id number] Date of Birth: August 30, 1960  Today's Date: 06/24/2013 Time:  - 1330-1430  (60 min)    Short Term Goals: Week 1:   STG=LTG  Skilled Therapeutic Interventions/Progress Updates:    Pt. Propelled wc to ADL apartment.  Did troilet transfer with SBA. Needed set up/supervisiaon with tub transfer for safety and adherence to WB precaution.   Pt. Sat in tub and washed self and dressed self in wc and standing to don briefs.  Pt needed assistance with getting tangles out of hair. Pt. Had frequent episodes of SOB during session.   Oxygen was 81 % on no oxygen.  Put on 2 litersoxygen and went to 90 % after 2 minutes.   Therapy Documentation Precautions:  Precautions Precautions: Fall;Other (comment) Precaution Comments: L clavicle fracture Required Braces or Orthoses: Other Brace/Splint Other Brace/Splint: Sling for L UE Restrictions Weight Bearing Restrictions: Yes LUE Weight Bearing: Non weight bearing       Pain: Pain Assessment Pain Score: 0-No pain   9/10 left ribs           See FIM for current functional status  Therapy/Group: Individual Therapy  Lisa Roca 06/24/2013, 2:32 PM

## 2013-06-25 ENCOUNTER — Inpatient Hospital Stay (HOSPITAL_COMMUNITY): Payer: Self-pay | Admitting: Occupational Therapy

## 2013-06-25 ENCOUNTER — Inpatient Hospital Stay (HOSPITAL_COMMUNITY): Payer: Self-pay

## 2013-06-25 ENCOUNTER — Encounter (HOSPITAL_COMMUNITY): Payer: Self-pay | Admitting: Occupational Therapy

## 2013-06-25 ENCOUNTER — Inpatient Hospital Stay (HOSPITAL_COMMUNITY): Payer: Self-pay | Admitting: Physical Therapy

## 2013-06-25 DIAGNOSIS — J9600 Acute respiratory failure, unspecified whether with hypoxia or hypercapnia: Secondary | ICD-10-CM

## 2013-06-25 DIAGNOSIS — S2239XA Fracture of one rib, unspecified side, initial encounter for closed fracture: Secondary | ICD-10-CM

## 2013-06-25 DIAGNOSIS — W11XXXA Fall on and from ladder, initial encounter: Secondary | ICD-10-CM

## 2013-06-25 DIAGNOSIS — J90 Pleural effusion, not elsewhere classified: Secondary | ICD-10-CM

## 2013-06-25 LAB — COMPREHENSIVE METABOLIC PANEL
ALK PHOS: 127 U/L — AB (ref 39–117)
ALT: 29 U/L (ref 0–53)
AST: 25 U/L (ref 0–37)
Albumin: 2.6 g/dL — ABNORMAL LOW (ref 3.5–5.2)
BUN: 17 mg/dL (ref 6–23)
CHLORIDE: 99 meq/L (ref 96–112)
CO2: 32 mEq/L (ref 19–32)
Calcium: 9.4 mg/dL (ref 8.4–10.5)
Creatinine, Ser: 0.63 mg/dL (ref 0.50–1.35)
GFR calc non Af Amer: >90 mL/min (ref >90)
GLUCOSE: 104 mg/dL — AB (ref 70–99)
POTASSIUM: 4.5 meq/L (ref 3.7–5.3)
Sodium: 139 mEq/L (ref 137–147)
Total Bilirubin: <0.2 mg/dL — ABNORMAL LOW (ref 0.3–1.2)
Total Protein: 5.8 g/dL — ABNORMAL LOW (ref 6.0–8.3)

## 2013-06-25 LAB — CBC WITH DIFFERENTIAL/PLATELET
Basophils Absolute: 0.1 10*3/uL (ref 0.0–0.1)
Basophils Relative: 1 % (ref 0–1)
Eosinophils Absolute: 0.3 10*3/uL (ref 0.0–0.7)
Eosinophils Relative: 3 % (ref 0–5)
HCT: 34.4 % — ABNORMAL LOW (ref 39.0–52.0)
Hemoglobin: 11.1 g/dL — ABNORMAL LOW (ref 13.0–17.0)
LYMPHS ABS: 1.7 10*3/uL (ref 0.7–4.0)
LYMPHS PCT: 20 % (ref 12–46)
MCH: 31.7 pg (ref 26.0–34.0)
MCHC: 32.3 g/dL (ref 30.0–36.0)
MCV: 98.3 fL (ref 78.0–100.0)
Monocytes Absolute: 1.1 10*3/uL — ABNORMAL HIGH (ref 0.1–1.0)
Monocytes Relative: 13 % — ABNORMAL HIGH (ref 3–12)
NEUTROS ABS: 5.3 10*3/uL (ref 1.7–7.7)
Neutrophils Relative %: 63 % (ref 43–77)
PLATELETS: 417 10*3/uL — AB (ref 150–400)
RBC: 3.5 MIL/uL — AB (ref 4.22–5.81)
RDW: 13 % (ref 11.5–15.5)
WBC: 8.4 10*3/uL (ref 4.0–10.5)

## 2013-06-25 NOTE — Progress Notes (Addendum)
Occupational Therapy Session Note  Patient Details  Name: James [name] MRN: [id number] Date of Birth: Apr 30, 1960  Today's Date: 06/25/2013 Time: 1110-1205 Time Calculation (min): 55 min  Skilled Therapeutic Interventions/Progress Updates:  Patient received seated in recliner with RN present for medication management, patient with 8/10 complaints of pain. Patient's LUE in sling for comfort. According to OT navigator, patient NWB>LUE and sling to be worn for comfort; no ROM restrictions. Patient ambulated from recliner > w/c with steady assist. From here, patient propelled self > 1st floor of hospital using RUE and bilateral LEs. When on first floor, focused skilled intervention on functional ambulation/mobility without an AD. Patient ambulated > bench and therapist started engaging patient in LUE ROM exercises (body weight). Patient with request to use bathroom, therefore patient ambulated back to w/c and propelled self> bathroom. Patient stood to use urinal, then propelled self > sink for grooming task of washing hands. Patient propelled self back outside, ambulated > bench and therapist engaged patient in additional LUE ROM exercises (body weight); shoulder flexion/extension, elbow flexion/extension, shoulder horizontal abduction/adduction. After exercises, patient transferred back to w/c and propelled self back to room. In room, patient transferred > recliner and left with all needs within reach. Patient mentioned "I feel like a new man after going outside!".   Patients 02 sats remained <90% throughout session on room air.   Precautions:  Precautions Precautions: Fall;Other (comment) Precaution Comments: L clavicle fracture Required Braces or Orthoses: Other Brace/Splint Other Brace/Splint: Sling for L UE Restrictions Weight Bearing Restrictions: Yes LUE Weight Bearing: Non weight bearing  See FIM for current functional status  Therapy/Group: Individual  Therapy  Riannon Mukherjee 06/25/2013, 12:16 PM

## 2013-06-25 NOTE — IPOC Note (Signed)
Overall Plan of Care [facility]) Patient Details Name: MATTHIEU LOFTUS MRN: [id number] DOB: 1960/07/04  Admitting Diagnosis: [facility] Problems: Active Problems:   Trauma of chest     Functional Problem List: Nursing Endurance;Medication Management;Pain;Safety;Skin Integrity  PT Balance;Endurance;Safety  OT Balance;Edema;Cognition;Endurance;Motor;Pain;Safety  SLP    TR         Basic ADL's: OT Eating;Grooming;Bathing;Dressing;Toileting     Advanced  ADL's: OT Simple Meal Preparation;Laundry;Light Housekeeping     Transfers: PT Bed Mobility;Bed to Chair;Car;Furniture;Floor  OT Toilet;Tub/Shower     Locomotion: PT Ambulation;Stairs     Additional Impairments: OT None  SLP        TR      Anticipated Outcomes Item Anticipated Outcome  Self Feeding independent  Swallowing      Basic self-care  mod indep  Toileting  mod indep   Bathroom Transfers mod I with toilet and supervison with tub  Bowel/Bladder  To remain continent of Bladder and bowel  Transfers  Mod I   Locomotion  Mod I   Communication     Cognition     Pain  Pain <3 out of 10  Safety/Judgment  To be free of fall while on rehab   Therapy Plan: PT Intensity: Minimum of 1-2 x/day ,45 to 90 minutes PT Frequency: 5 out of 7 days PT Duration Estimated Length of Stay: 3-5 days  OT Intensity: Minimum of 1-2 x/day, 45 to 90 minutes OT Frequency: 5 out of 7 days OT Duration/Estimated Length of Stay: 5 days         Team Interventions: Nursing Interventions Patient/Family Education;Pain Management;Skin Care/Wound Management;Medication Management;Discharge Planning  PT interventions Ambulation/gait training;Balance/vestibular training;Discharge planning;DME/adaptive equipment instruction;Functional mobility training;Patient/family education;Stair training;Therapeutic Activities;Therapeutic Exercise  OT Interventions Balance/vestibular training;Community reintegration;Discharge  planning;DME/adaptive equipment instruction;Functional mobility training;Pain management;Patient/family education;Self Care/advanced ADL retraining;Therapeutic Activities;Therapeutic Exercise;UE/LE Strength taining/ROM  SLP Interventions    TR Interventions    SW/CM Interventions      Team Discharge Planning: Destination: PT-Home ,OT- Home , SLP-  Projected Follow-up: PT-Outpatient PT, OT-  Home health OT, SLP-  Projected Equipment Needs: PT-To be determined, OT- Tub/shower seat, SLP-  Equipment Details: PT- , OT-  Patient/family involved in discharge planning: PT- Patient,  OT-Patient, SLP-   MD ELOS: 7 Medical Rehab Prognosis:  Good Assessment: 53 y.o. right-handed male admitted 06/05/2013 after a fall of approximately 6 feet from a ladder--no loss of consciousness reported. Cranial CT and CT cervical spine negative. Other work up revealed left comminuted clavicle fracture, multiple left-sided rib fractures with flail chest, occult loculated pneumothorax and small left hemothorax. He underwent VATS for mini thoracotomy with drainage of hemothorax and decortication of LLL by Dr. Prescott Gum on 06/11/13. s Dr. Rolena Infante recommended conservative care with figure 8 sling and NWB LUE for left clavicle fracture. Hospital course complicated by respiratory decline requiring intubation from 06/07/2013 to 06/13/2013. He had required in and out caths for urinary retention. ABLA being monitored with Hgb-10.4. He developed confusion with hallucinations due to ETOH withdrawal on 06/15/13 and placed on precedex and ativan. Follow up CT head without acute abnormality. Antibiotics--Vanc/Cefepime resumed on 06/17/13 due to leucocytosis with fever and worsening of LLL effusion question due to HAP. Last two left chest tubes removed on 06/20/13.  Now requiring 24/7 Rehab RN,MD, as well as CIR level PT, OT and SLP.  Treatment team will focus on ADLs and mobility with goals set at Mod I   See Team Conference Notes for  weekly updates to  the plan of care

## 2013-06-25 NOTE — Progress Notes (Signed)
Occupational Therapy Session Note  Patient Details  Name: CONSTANT MANDEVILLE MRN: [id number] Date of Birth: 12/15/1960  Today's Date: 06/25/2013 Time: 7001-7494 Time Calculation (min): 28 min     Skilled Therapeutic Interventions/Progress Updates:    Patient seen this pm for OT intervention to address activity tolerance, endurance, and O2 endurance with activity.  Patient currently on room air.  Patient O2 level drops to 88% with walking, but able to recover to 91-93% with emphasis on deep breathing.  Patient seems to limit intake of breath due to rib pain.  Patient able to walk without device with close supervision.  Patient very motivated to increase independence with ADL / mobility.    Therapy Documentation Precautions:  Precautions Precautions: Fall;Other (comment) Precaution Comments: L clavicle fracture Required Braces or Orthoses: Other Brace/Splint Other Brace/Splint: Sling for L UE Restrictions Weight Bearing Restrictions: Yes LUE Weight Bearing: Non weight bearing  Pain: 8/10 ribs left.   RN called and gave pain medicine  See FIM for current functional status  Therapy/Group: Individual Therapy  Mariah Milling 06/25/2013, 3:30 PM

## 2013-06-25 NOTE — Care Management Note (Signed)
Organ Individual Statement of Services  Patient Name:  James [name]  Date:  06/25/2013  Welcome to the [facility].  Our goal is to provide you with an individualized program based on your diagnosis and situation, designed to meet your specific needs.  With this comprehensive rehabilitation program, you will be expected to participate in at least 3 hours of rehabilitation therapies Monday-Friday, with modified therapy programming on the weekends.  Your rehabilitation program will include the following services:  Physical Therapy (PT), Occupational Therapy (OT), 24 hour per day rehabilitation nursing, Case Management (Social Worker), Rehabilitation Medicine, Nutrition Services and Pharmacy Services  Weekly team conferences will be held on Tuesdays to discuss your progress.  Your Social Worker will talk with you frequently to get your input and to update you on team discussions.  Team conferences with you and your family in attendance may also be held.  Expected length of stay: 3-5 days  Overall anticipated outcome: modified independent  Depending on your progress and recovery, your program may change. Your Social Worker will coordinate services and will keep you informed of any changes. Your Social Worker's name and contact numbers are listed  below.  The following services may also be recommended but are not provided by the [facility] will be made to provide these services after discharge if needed.  Arrangements include referral to agencies that provide these services.  Your insurance has been verified to be:  None Your primary doctor is:  None  Pertinent information will be shared with your doctor and your insurance company.  Social Worker:  [place], [place] or ([phone]   Information  discussed with and copy given to patient by: Lennart Pall, 06/25/2013, 1:25 PM

## 2013-06-25 NOTE — Progress Notes (Signed)
[name], MD Physician Signed Physical Medicine and Rehabilitation Consult Note Service date: 06/14/2013 6:03 AM  Related encounter: Admission (Discharged) from 06/04/2013 in [facility]           Physical Medicine and Rehabilitation Consult Reason for Consult: Multitrauma after a fall Referring Physician: Trauma services     HPI: James [name] is a 53 y.o. right-handed male admitted 06/05/2013. Patient presented after a fall of approximately 6 feet from a ladder without loss of consciousness. Cranial CT scan negative. CT cervical spine negative. X-rays and imaging revealed left comminuted clavicle fracture, multiple left-sided rib fractures with flail chest, Occult loculated pneumothorax and small left hemothorax. Underwent video-assisted thoracoscopy and drainage of hemothorax repair lung laceration resection LLL wedge 06/11/2013 per Dr. Prescott Gum. Followup orthopedic services Dr. Rolena Infante for left clavicle fracture with conservative care and placed in a figure 8 sling nonweightbearing left upper extremity. Hospital course complicated by respiratory decline placed on ventilator support with intubation 06/07/2013 extubated 06/13/2013. Bouts of urinary retention with needs for in and out catheterization. Acute blood loss anemia 9.9 and monitored. Placed on subcutaneous Lovenox for DVT prophylaxis 06/13/2013. Physical therapy evaluation 06/13/2013 with recommendations for physical medicine rehabilitation consult.     Review of Systems  Musculoskeletal: Positive for myalgias.  All other systems reviewed and are negative. Past Medical History   Diagnosis  Date   .  Pneumothorax      Past Surgical History   Procedure  Laterality  Date   .  Video bronchoscopy  N/A  06/11/2013       Procedure: VIDEO BRONCHOSCOPY;  Surgeon: Ivin Poot, MD;  Location: [facility];  Service: Thoracic;  Laterality: N/A;   .  Video assisted thoracoscopy (vats)/thorocotomy   Left  06/11/2013       Procedure: VIDEO ASSISTED THORACOSCOPY (VATS)/THOROCOTOMY;  Surgeon: Ivin Poot, MD;  Location: [facility];  Service: Thoracic;  Laterality: Left;    History reviewed. No pertinent family history. Social History: reports that he has been smoking Cigarettes.  He has been smoking about 0.00 packs per day. He does not have any smokeless tobacco history on file. He reports that he drinks alcohol. He reports that he uses illicit drugs (Marijuana). Allergies: No Known Allergies No prescriptions prior to admission      Home: Home Living Family/patient expects to be discharged to:: Private residence Living Arrangements: Alone Available Help at Discharge: Family;Friend(s) Type of Home: House Home Access: Stairs to enter Technical brewer of Steps: 2 Entrance Stairs-Rails: Right Home Layout: One level Home Equipment: None Additional Comments: Pt states he may stay with sister post d/c   Functional History: Prior Function Level of Independence: Independent Comments: works as Location manager Status:   Mobility: Bed Mobility Overal bed mobility: Needs Assistance;+2 for physical assistance Bed Mobility: Supine to Sit Supine to sit: +2 for physical assistance;Mod assist General bed mobility comments: Pt able to participate but need to use pad to help move hips to edge of bed Transfers Overall transfer level: Needs assistance Equipment used: 1 person hand held assist (face to face ) Transfers: Sit to/from Stand;Stand Pivot Transfers Sit to Stand: Mod assist Stand pivot transfers: Max assist General transfer comment: Face to face, assist to elevate from bed, max assist to pivot to chair with heavy reliance on therapist support.  Ambulation/Gait Ambulation/Gait assistance: Min assist Ambulation Distance (Feet): 8 Feet Assistive device: 1 person hand held assist   ADL: ADL Overall ADL's :  Needs assistance/impaired Eating/Feeding: Set up;Sitting Grooming:  Moderate assistance Upper Body Bathing: Moderate assistance;Sitting Lower Body Bathing: Maximal assistance;Sitting/lateral leans Upper Body Dressing : Maximal assistance;Sitting Lower Body Dressing: Total assistance;Sit to/from stand Toilet Transfer: Maximal assistance;Stand-pivot Toileting- Clothing Manipulation and Hygiene: Maximal assistance Functional mobility during ADLs: Maximal assistance;+2 for safety/equipment General ADL Comments: Further decline in funciton since initial eval   Cognition: Cognition Overall Cognitive Status: Within Functional Limits for tasks assessed Orientation Level: Oriented to person;Oriented to place;Oriented to situation Cognition Arousal/Alertness: Awake/alert Behavior During Therapy: WFL for tasks assessed/performed Overall Cognitive Status: Within Functional Limits for tasks assessed   Blood pressure 130/76, pulse 85, temperature 98.2 F (36.8 C), temperature source Oral, resp. rate 22, height 5\' 9"  (1.753 m), weight 77.3 kg (170 lb 6.7 oz), SpO2 94.00%. Physical Exam  Constitutional: He is oriented to person, place, and time. He appears well-developed.  HENT:   Head: Normocephalic.  Eyes: EOM are normal.  Neck: Normal range of motion. Neck supple. No thyromegaly present.  Cardiovascular: Normal rate and regular rhythm.   Respiratory:  Decreased breath sounds at the bases but clear to auscultation, CT in place  GI: Soft. Bowel sounds are normal. He exhibits no distension.  Neurological: He is alert and oriented to person, place, and time. No cranial nerve deficit. Coordination normal.  Moves all 4s but limited by pain especially in UE's. Sensation appears grossly intact.   Skin: Skin is warm and dry.  VATS site clean and dry  Psychiatric: He has a normal mood and affect. His behavior is normal.     Results for orders placed during the hospital encounter of 06/04/13 (from the past 24 hour(s))   CBC     Status: Abnormal     Collection Time       06/13/13  6:10 AM       Result  Value  Ref Range     WBC  11.7 (*)  4.0 - 10.5 K/uL     RBC  3.03 (*)  4.22 - 5.81 MIL/uL     Hemoglobin  9.9 (*)  13.0 - 17.0 g/dL     HCT  29.5 (*)  39.0 - 52.0 %     MCV  97.4   78.0 - 100.0 fL     MCH  32.7   26.0 - 34.0 pg     MCHC  33.6   30.0 - 36.0 g/dL     RDW  13.2   11.5 - 15.5 %     Platelets  243   150 - 400 K/uL   COMPREHENSIVE METABOLIC PANEL     Status: Abnormal     Collection Time      06/13/13  6:10 AM       Result  Value  Ref Range     Sodium  140   137 - 147 mEq/L     Potassium  4.3   3.7 - 5.3 mEq/L     Chloride  103   96 - 112 mEq/L     CO2  28   19 - 32 mEq/L     Glucose, Bld  130 (*)  70 - 99 mg/dL     BUN  17   6 - 23 mg/dL     Creatinine, Ser  0.53   0.50 - 1.35 mg/dL     Calcium  8.4   8.4 - 10.5 mg/dL     Total Protein  5.2 (*)  6.0 - 8.3 g/dL  Albumin  2.2 (*)  3.5 - 5.2 g/dL     AST  50 (*)  0 - 37 U/L     ALT  44   0 - 53 U/L     Alkaline Phosphatase  41   39 - 117 U/L     Total Bilirubin  0.3   0.3 - 1.2 mg/dL     GFR calc non Af Amer  >90   >90 mL/min     GFR calc Af Amer  >90   >90 mL/min   GLUCOSE, CAPILLARY     Status: Abnormal     Collection Time      06/13/13  8:18 AM       Result  Value  Ref Range     Glucose-Capillary  126 (*)  70 - 99 mg/dL   TRIGLYCERIDES     Status: None     Collection Time      06/13/13  8:45 AM       Result  Value  Ref Range     Triglycerides  50   <150 mg/dL   GLUCOSE, CAPILLARY     Status: None     Collection Time      06/13/13 11:59 AM       Result  Value  Ref Range     Glucose-Capillary  95   70 - 99 mg/dL   GLUCOSE, CAPILLARY     Status: Abnormal     Collection Time      06/13/13  3:50 PM       Result  Value  Ref Range     Glucose-Capillary  135 (*)  70 - 99 mg/dL     Comment 1  Notify RN        Comment 2  Documented in Chart      GLUCOSE, CAPILLARY     Status: Abnormal     Collection Time      06/13/13  8:22 PM       Result  Value  Ref Range      Glucose-Capillary  125 (*)  70 - 99 mg/dL   GLUCOSE, CAPILLARY     Status: Abnormal     Collection Time      06/13/13 11:57 PM       Result  Value  Ref Range     Glucose-Capillary  112 (*)  70 - 99 mg/dL     Comment 1  Documented in Chart        Comment 2  Notify RN      GLUCOSE, CAPILLARY     Status: Abnormal     Collection Time      06/14/13  3:21 AM       Result  Value  Ref Range     Glucose-Capillary  110 (*)  70 - 99 mg/dL     Comment 1  Documented in Chart        Comment 2  Notify RN       Dg Chest Port 1 View   06/13/2013   CLINICAL DATA:  Prior thoracotomy.  EXAM: PORTABLE CHEST - 1 VIEW  COMPARISON:  Chest x-ray 06/12/2013.  FINDINGS: Endotracheal tube, NG tube, central line in stable position. Three left chest tubes in stable position. Previously identified left small pneumothorax no longer visualized. Basilar atelectatic changes are present particularly on the left. Stable left pleural effusion. Stable cardiomegaly, normal pulmonary vascularity. Multiple bilateral rib fractures again noted. Left clavicular fracture is present.  IMPRESSION: 1. Stable line and tube positions. 2. Three left chest tubes in stable position. Previously identified small pneumothorax no longer visualized. 3. Persistent bibasilar atelectasis particularly on the left. Small stable left pleural effusion. 4. Multiple bilateral rib fractures and left clavicular fracture again noted.   Electronically Signed   By: Marcello Moores  Register   On: 06/13/2013 07:36     Assessment/Plan: Diagnosis: left clavicle, rib fxs, ptx and flail chest after fall Does the need for close, 24 hr/day medical supervision in concert with the patient's rehab needs make it unreasonable for this patient to be served in a less intensive setting? Yes Co-Morbidities requiring supervision/potential complications: pain, pneumonia Due to bladder management, bowel management, safety, skin/wound care, disease management, medication administration and  pain management, does the patient require 24 hr/day rehab nursing? Yes Does the patient require coordinated care of a physician, rehab nurse, PT (1-2 hrs/day, 5 days/week) and OT (1-2 hrs/day, 5 days/week) to address physical and functional deficits in the context of the above medical diagnosis(es)? Yes Addressing deficits in the following areas: balance, endurance, locomotion, strength, transferring, bowel/bladder control, bathing, dressing, feeding, grooming and toileting Can the patient actively participate in an intensive therapy program of at least 3 hrs of therapy per day at least 5 days per week? Yes The potential for patient to make measurable gains while on inpatient rehab is excellent Anticipated functional outcomes upon discharge from inpatient rehab are modified independent and supervision  with PT, modified independent and supervision with OT, n/a with SLP. Estimated rehab length of stay to reach the above functional goals is: 7-12 days Does the patient have adequate social supports to accommodate these discharge functional goals? Yes Anticipated D/C setting: Home Anticipated post D/C treatments: HH therapy Overall Rehab/Functional Prognosis: excellent   RECOMMENDATIONS: This patient's condition is appropriate for continued rehabilitative care in the following setting: CIR Patient has agreed to participate in recommended program. Yes Note that insurance prior authorization may be required for reimbursement for recommended care.   Comment: Rehab Admissions Coordinator to follow up.   Thanks,   [name], MD, Mellody Drown         06/14/2013    Revision History...     Date/Time User Action   06/14/2013 8:39 AM [name], MD Sign   06/14/2013 6:21 AM Cathlyn Parsons, PA-C [place]  View Details Report   Routing History...     Date/Time From To Method   06/14/2013 8:39 AM [name], MD [name], MD In Basket

## 2013-06-25 NOTE — Progress Notes (Signed)
Occupational Therapy Session Note  Patient Details  Name: [name] MRN: [id number] Date of Birth: Aug 20, 1960  Today's Date: 06/25/2013 Time: 0626-9485 Time Calculation (min): 45 min  Short Term Goals: Week 1:     Skilled Therapeutic Interventions/Progress Updates: Therapeutic activity with focus on dynamic standing balanced during ADL, endurance, safety awareness, and functional mobility.   Patient received seated in recliner and receptive for continued treatment.   Patient endorsed ongoing symptoms of dizziness while ambulating and SOB during activity with awareness of restrictions to WB at LUE.   Patient accepted challenge to perform ADL tasks standing (toileting, shaving, brushing his teeth), as well as ambulating to day room.   Patient demo's scissoring gait during mobility with occasional LOB (2X during walk to day room) but remained close to handrails for safety.  Patient returned to his room to complete ADL, reporting mild dyspnea.  O2 assessed at 90% after ambulating but recovered to 95% within 2 minutes while seated in w/c, room air.  During tasks, pPatient presents as moderately anxious with possible attention deficits but with intact intellectual awareness of his restrictions and good safety awareness while performing ADL, standing.      Therapy Documentation Precautions:  Precautions Precautions: Fall;Other (comment) Precaution Comments: L clavicle fracture Required Braces or Orthoses: Other Brace/Splint Other Brace/Splint: Sling for L UE Restrictions Weight Bearing Restrictions: Yes LUE Weight Bearing: Non weight bearing  Vital Signs: Therapy Vitals Temp: 98.4 F (36.9 C) Temp src: Oral Pulse Rate: 99 Resp: 16 BP: 131/80 mmHg Patient Position (if appropriate): Sitting Oxygen Therapy SpO2: 94 % O2 Device: None (Room air)  Pain: Pain Assessment Pain Score: 7  Pain Type: Acute pain Pain Location: Rib cage Pain Orientation: Left Pain Descriptors /  Indicators: Aching Pain Onset: On-going Pain Intervention(s): Medication (See eMAR)  See FIM for current functional status  Therapy/Group: Individual Therapy  Miguel Barrera 06/25/2013, 3:16 PM

## 2013-06-25 NOTE — Progress Notes (Signed)
[facility] PHYSICAL MEDICINE & REHABILITATION     PROGRESS NOTE    Subjective/Complaints: No problems yesterday. Had a quiet day. Pain better overall.  A  review of systems has been performed and if not noted above is otherwise negative.   Objective: Vital Signs: Blood pressure 109/64, pulse 81, temperature 97.7 F (36.5 C), temperature source Oral, resp. rate 18, height 5\' 9"  (1.753 m), weight 64.819 kg (142 lb 14.4 oz), SpO2 94.00%. No results found.  Recent Labs  06/25/13 0430  WBC 8.4  HGB 11.1*  HCT 34.4*  PLT 417*    Recent Labs  06/25/13 0430  NA 139  K 4.5  CL 99  GLUCOSE 104*  BUN 17  CREATININE 0.63  CALCIUM 9.4   CBG (last 3)   Recent Labs  06/22/13 0806 06/22/13 1219 06/22/13 1718  GLUCAP 100* 122* 119*    Wt Readings from Last 3 Encounters:  06/24/13 64.819 kg (142 lb 14.4 oz)  06/22/13 67.949 kg (149 lb 12.8 oz)  06/22/13 67.949 kg (149 lb 12.8 oz)    Physical Exam:  Constitutional: He is oriented to person, place, and time. He appears well-developed.  HENT:  Head: Normocephalic.  Eyes: EOM are normal.  Neck: Normal range of motion. Neck supple. No thyromegaly present.  Cardiovascular: Normal rate and regular rhythm.  Respiratory:  Decreased breath sounds at the bases    GI: Soft. Bowel sounds are normal. He exhibits no distension.  Neurological: He is alert and oriented to person, place, and time. No cranial nerve deficit. Coordination normal.  Moves all 4s . Sensation appears grossly intact.  Skin: Skin is warm and dry.  VATS site clean and dry. Sutures intact. Chest wall, no drainage Psychiatric: He has a normal mood and affect. His behavior is normal   Assessment/Plan: 1. Functional deficits secondary to polytrauma after fall which require 3+ hours per day of interdisciplinary therapy in a comprehensive inpatient rehab setting. Physiatrist is providing close team supervision and 24 hour management of active medical problems  listed below. Physiatrist and rehab team continue to assess barriers to discharge/monitor patient progress toward functional and medical goals. FIM: FIM - Bathing Bathing Steps Patient Completed: Left Arm Bathing: 2: Max-Patient completes 3-4 60f 10 parts or 25-49%  FIM - Upper Body Dressing/Undressing Upper body dressing/undressing: 0: Activity did not occur FIM - Lower Body Dressing/Undressing Lower body dressing/undressing: 0: Activity did not occur  FIM - Toileting Toileting steps completed by patient: Adjust clothing prior to toileting;Performs perineal hygiene;Adjust clothing after toileting Toileting Assistive Devices: Grab bar or rail for support Toileting: 0: Activity did not occur  FIM - Air cabin crew Transfers: 5-To toilet/BSC: Supervision (verbal cues/safety issues);5-From toilet/BSC: Supervision (verbal cues/safety issues)  FIM - Engineer, site Assistive Devices: Arm rests Bed/Chair Transfer: 5: Supine > Sit: Supervision (verbal cues/safety issues);4: Bed > Chair or W/C: Min A (steadying Pt. > 75%)  FIM - Locomotion: Wheelchair Distance: 150 Locomotion: Wheelchair: 5: Travels 150 ft or more: maneuvers on rugs and over door sills with supervision, cueing or coaxing FIM - Locomotion: Ambulation Locomotion: Ambulation Assistive Devices:  (no AD) Ambulation/Gait Assistance: 4: Min guard;4: Min assist Locomotion: Ambulation: 4: Travels 150 ft or more with minimal assistance (Pt.>75%)  Comprehension Comprehension Mode: Auditory Comprehension: 5-Understands complex 90% of the time/Cues < 10% of the time  Expression Expression Mode: Verbal Expression: 5-Expresses complex 90% of the time/cues < 10% of the time  Social Interaction Social Interaction: 7-Interacts appropriately with others -  No medications needed.  Problem Solving Problem Solving: 6-Solves complex problems: With extra time  Memory Memory: 7-Complete Independence: No  helper  Medical Problem List and Plan:  1. Functional deficits secondary to left clavicle, rib fxs, ptx and flail chest after fall  2. DVT Prophylaxis/Anticoagulation: Mechanical: Sequential compression devices, below knee Bilateral lower extremities  3. Pain Management: overall improved  -Will continue prn oxycodone.   - lidoderm patches for rib pain--discussed with patient the $$ of these --may try "icy hot" patches after dc 4. Mood: Stable. Will have LCSW follow for evaluation and support.  5. Neuropsych: This patient is capable of making decisions on her own behalf.  6. ABLA: Will add iron supplement  7. Pneumothorax s/p VATS: Continue IS with flutter valve. On duonebs tid.  8. Left Clavicle fracture: Is to be NWB LUE with figure 8 splint for support.  9. LLL PNA: Reactive leucocytosis resolving. Continue Maxipime and Vancomycin for now. Follow up CXR on 06/22  -afebrile at present 10 Malnutrition:   protein supplement. Push po  LOS (Days) 3 A FACE TO FACE EVALUATION WAS PERFORMED  SWARTZ,ZACHARY T 06/25/2013 7:59 AM
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
## 2013-06-25 NOTE — Progress Notes (Signed)
Patient information reviewed and entered into eRehab system by Marie Noel, RN, CRRN, PPS Coordinator.  Information including medical coding and functional independence measure will be reviewed and updated through discharge.    
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
## 2013-06-25 NOTE — Progress Notes (Signed)
Physical Therapy Session Note  Patient Details  Name: James [name] MRN: [id number] Date of Birth: [birth year]  Today's Date: 06/25/2013 Time: 1110-1205 Time Calculation (min): 55 min  Short Term Goals: Week 1:  PT Short Term Goal 1 (Week 1): =LTG's due to ELOS  Skilled Therapeutic Interventions/Progress Updates:   Pt resting in recliner with LUE out of sling and using LUE.  Reminded pt about NWB LUE and maintaining UE in sling for comfort and minimize pain.  Pt also noted to have Sp02 removed in sitting.  Sp02 assessed and pt noted to be able to maintain Sp02 >90% at rest in sitting.  Pt requesting to get dressed and wash his face and teeth before going to gym.  Pt engaged in multiple self-care activities (toileting in standing, standing at sink for hand, face and oral hygiene and donning deodorant and clothing) in standing and sitting for dynamic standing balance and endurance training with supervision-min A for balance and min verbal cues for safety and problem solving.  Pt performing initially on RA but when Sp02 re-assessed pt noted to be at 80% and HR: 124 bpm.  Pt cued to rest EOB and supplemental 02 restored.  Pt completed all self care tasks and performed gait training to/from gym with supervision-min A with shuffling gait, decreased step and stride length and foot clearance noted with intermittent lateral LOB and narrow BOS.  Pt cued for increased step length, foot clearance, widen BOS and to practice pursed lip breathing (with supplemental O2) during gait.  In gym performed stair negotiation training up/down 5 stairs x 2 reps with RUE support on rail and alternating sequence with supervision and x 2 reps without use of rail with supervision-min A step to sequence for balance training.  Performed dynamic balance and balance reaction training with heel walking, toe walking, and tandem walking x 25' x 2 reps each with min A.  Pt required multiple rest breaks secondary to fatigue but was able to  maintain Sp02 >90% on 2L02 and use of pursed lip breathing.  Returned to room and pt set up in recliner to await OT.    Therapy Documentation Precautions:  Precautions Precautions: Fall;Other (comment) Precaution Comments: L clavicle fracture Required Braces or Orthoses: Other Brace/Splint Other Brace/Splint: Sling for L UE Restrictions Weight Bearing Restrictions: Yes LUE Weight Bearing: Non weight bearing Pain: Pain Assessment Pain Score: 2  Pain Type: Acute pain Pain Location: Rib cage Pain Orientation: Left Pain Descriptors / Indicators: Aching Pain Onset: On-going Pain Intervention(s): Other (Comment) (premedicated and patch in place) Locomotion : Ambulation Ambulation/Gait Assistance: 4: Min guard;4: Min assist   See FIM for current functional status  Therapy/Group: Individual Therapy  [name] [facility] 06/25/2013, 12:24 PM

## 2013-06-26 ENCOUNTER — Ambulatory Visit (HOSPITAL_COMMUNITY): Payer: Self-pay | Admitting: *Deleted

## 2013-06-26 ENCOUNTER — Inpatient Hospital Stay (HOSPITAL_COMMUNITY): Payer: Self-pay | Admitting: Physical Therapy

## 2013-06-26 ENCOUNTER — Encounter (HOSPITAL_COMMUNITY): Payer: Self-pay | Admitting: Occupational Therapy

## 2013-06-26 ENCOUNTER — Inpatient Hospital Stay (HOSPITAL_COMMUNITY): Payer: Self-pay | Admitting: Occupational Therapy

## 2013-06-26 DIAGNOSIS — S2239XA Fracture of one rib, unspecified side, initial encounter for closed fracture: Secondary | ICD-10-CM

## 2013-06-26 DIAGNOSIS — J9600 Acute respiratory failure, unspecified whether with hypoxia or hypercapnia: Secondary | ICD-10-CM

## 2013-06-26 DIAGNOSIS — W11XXXA Fall on and from ladder, initial encounter: Secondary | ICD-10-CM

## 2013-06-26 DIAGNOSIS — J90 Pleural effusion, not elsewhere classified: Secondary | ICD-10-CM

## 2013-06-26 NOTE — Progress Notes (Signed)
Occupational Therapy Session Note  Patient Details  Name: James [name] MRN: [id number] Date of Birth: 24-Apr-1960  Today's Date: 06/26/2013 Time: 0820-0900 Time Calculation (min): 40 min  Skilled Therapeutic Interventions/Progress Updates:  Patient with 7/10 complaints of pain, RN aware Patient received seated in recliner. Patient requested to complete bathing & dressing from sink level. Patient took some time to finish his coffee and therapist talked with patient about discharge planning. Patient good with plan for discharge Friday 6/26. Patient ambulated > sink for grooming & dressing tasks. Patient also worked on donning/doffing of sling>LUE.  Patient overall supervision for these tasks, goals set at mod I. Patient ambulated from room > ADL apartment, patient with decreased balance during this functional ambulation task. Patient took a seated rest break secondary to increased HR and decreased 02 sats. From here, patient ambulated into bathroom for tub transfer in/out of bathtub; patient prefers to bathe in tub as opposed to showering. 02 sats checked throughout session=88-96%; therapist encouraged rest breaks and pursed lip breathing when sats >90%. Patient continues to required supervision for functional tasks secondary to decreased dynamic standing balance/tolerance/endurance and decreased 02 support. Patient requires min verbal cues for seated rest breaks and pursed lip breathing in order to increase 02 sats -> <90%. At end of session, patient with bowel urgency. Patient left seated on elevated toilet seat in room bathroom, NT aware of patient's whereabouts and patient aware to call for assistance for toileting and in order to get back to recliner.   Precautions:  Precautions Precautions: Fall;Other (comment) Precaution Comments: L clavicle fracture Required Braces or Orthoses: Other Brace/Splint Other Brace/Splint: Sling for L UE Restrictions Weight Bearing Restrictions: Yes LUE Weight  Bearing: Non weight bearing  See FIM for current functional status  Therapy/Group: Individual Therapy  CLAY,PATRICIA 06/26/2013, 9:13 AM

## 2013-06-26 NOTE — Progress Notes (Signed)
[facility]     PROGRESS NOTE    Subjective/Complaints: No new issues. Anxious to get home. A  review of systems has been performed and if not noted above is otherwise negative.   Objective: Vital Signs: Blood pressure 108/70, pulse 84, temperature 98.5 F (36.9 C), temperature source Oral, resp. rate 18, height 5\' 9"  (1.753 m), weight 64.774 kg (142 lb 12.8 oz), SpO2 92.00%. Dg Chest 2 View  06/25/2013   CLINICAL DATA:  Status post left VATS  EXAM: CHEST  2 VIEW  COMPARISON:  06/22/2013  FINDINGS: Cardiac shadow is stable. Persistent left basilar changes are seen. No definitive pneumothorax is noted. Multiple left rib fractures are again noted. The right lung remains clear. Rib fractures are noted on right is well. A left clavicular fracture is again seen.  IMPRESSION: Persistent left basilar changes.  Chronic changes as described.   Electronically Signed   By: Inez Catalina M.D.   On: 06/25/2013 10:55    Recent Labs  06/25/13 0430  WBC 8.4  HGB 11.1*  HCT 34.4*  PLT 417*    Recent Labs  06/25/13 0430  NA 139  K 4.5  CL 99  GLUCOSE 104*  BUN 17  CREATININE 0.63  CALCIUM 9.4   CBG (last 3)  No results found for this basename: GLUCAP,  in the last 72 hours  Wt Readings from Last 3 Encounters:  06/26/13 64.774 kg (142 lb 12.8 oz)  06/22/13 67.949 kg (149 lb 12.8 oz)  06/22/13 67.949 kg (149 lb 12.8 oz)    Physical Exam:  Constitutional: He is oriented to person, place, and time. He appears well-developed.  HENT:  Head: Normocephalic.  Eyes: EOM are normal.  Neck: Normal range of motion. Neck supple. No thyromegaly present.  Cardiovascular: Normal rate and regular rhythm.  Respiratory:  Decreased breath sounds at the bases    GI: Soft. Bowel sounds are normal. He exhibits no distension.  Neurological: He is alert and oriented to person, place, and time. No cranial nerve deficit. Coordination normal.  Moves all 4s . Sensation  appears grossly intact.  Skin: Skin is warm and dry.  VATS site clean and dry. Sutures intact. Chest wall, no drainage Psychiatric: He has a normal mood and affect. His behavior is normal   Assessment/Plan: 1. Functional deficits secondary to polytrauma after fall which require 3+ hours per day of interdisciplinary therapy in a comprehensive inpatient rehab setting. Physiatrist is providing close team supervision and 24 hour management of active medical problems listed below. Physiatrist and rehab team continue to assess barriers to discharge/monitor patient progress toward functional and medical goals. FIM: FIM - Bathing Bathing Steps Patient Completed: Left Arm Bathing: 2: Max-Patient completes 3-4 74f 10 parts or 25-49%  FIM - Upper Body Dressing/Undressing Upper body dressing/undressing: 0: Activity did not occur FIM - Lower Body Dressing/Undressing Lower body dressing/undressing: 0: Activity did not occur  FIM - Toileting Toileting steps completed by patient: Adjust clothing prior to toileting;Performs perineal hygiene;Adjust clothing after toileting Toileting Assistive Devices: Grab bar or rail for support Toileting: 4: Steadying assist  FIM - Air cabin crew Transfers: 4-To toilet/BSC: Min A (steadying Pt. > 75%);4-From toilet/BSC: Min A (steadying Pt. > 75%)  FIM - Bed/Chair Transfer Bed/Chair Transfer Assistive Devices: Arm rests Bed/Chair Transfer: 4: Bed > Chair or W/C: Min A (steadying Pt. > 75%);4: Chair or W/C > Bed: Min A (steadying Pt. > 75%)  FIM - Locomotion: Wheelchair Distance: 150 Locomotion:  Wheelchair: 0: Activity did not occur FIM - Locomotion: Ambulation Locomotion: Ambulation Assistive Devices:  (no AD) Ambulation/Gait Assistance: 4: Min guard;4: Min assist Locomotion: Ambulation: 4: Travels 150 ft or more with minimal assistance (Pt.>75%)  Comprehension Comprehension Mode: Auditory Comprehension: 5-Understands complex 90% of the time/Cues <  10% of the time  Expression Expression Mode: Verbal Expression: 5-Expresses complex 90% of the time/cues < 10% of the time  Social Interaction Social Interaction: 5-Interacts appropriately 90% of the time - Needs monitoring or encouragement for participation or interaction.  Problem Solving Problem Solving: 5-Solves complex 90% of the time/cues < 10% of the time  Memory Memory: 7-Complete Independence: No helper  Medical Problem List and Plan:  1. Functional deficits secondary to left clavicle, rib fxs, ptx and flail chest after fall  2. DVT Prophylaxis/Anticoagulation: Mechanical: Sequential compression devices, below knee Bilateral lower extremities  3. Pain Management: overall improved  -Will continue prn oxycodone.   - lidoderm patches for rib pain-  4. Mood: Stable==egosupport as able.  5. Neuropsych: This patient is capable of making decisions on her own behalf.  6. ABLA: Will add iron supplement  7. Pneumothorax s/p VATS: Continue IS with flutter valve. On duonebs tid.  8. Left Clavicle fracture: Is to be NWB LUE with figure 8 splint for support.  9. LLL PNA: Reactive leucocytosis resolving. Continue Maxipime and Vancomycin for now. Follow up CXR on 06/22  -afebrile at present 10 Malnutrition:   protein supplement. Push po  LOS (Days) 4 A FACE TO FACE EVALUATION WAS PERFORMED  SWARTZ,ZACHARY T 06/26/2013 7:48 AM

## 2013-06-26 NOTE — Progress Notes (Signed)
Physical Therapy Session Note  Patient Details  Name: James [name] MRN: [id number] Date of Birth: [birth year]  Today's Date: 06/26/2013 Time: 1400-1445 Time Calculation (min): 45 min  Short Term Goals: Week 1:  PT Short Term Goal 1 (Week 1): =LTG's due to ELOS  Skilled Therapeutic Interventions/Progress Updates:    Patient received sitting in recliner. Session focused on functional mobility, dynamic standing balance, and activity tolerance. Functional ambulation without AD 175' x2 with supervision/intermittent min guard due to weaving path, cues for increased B foot clearance. Horseshoe toss 5 x2 with patient retrieving horseshoes from floor, requires min guard when picking up off floor. NuStep Level 4 with B LE for strengthening and activity tolerance. Patient returned to room and left sitting in recliner with all needs within reach.  Therapy Documentation Precautions:  Precautions Precautions: Fall;Other (comment) Precaution Comments: L clavicle fracture Required Braces or Orthoses: Other Brace/Splint Other Brace/Splint: Sling for L UE Restrictions Weight Bearing Restrictions: Yes LUE Weight Bearing: Non weight bearing Vital Signs: Therapy Vitals Pulse Rate: 88 Patient Position (if appropriate): Sitting Oxygen Therapy SpO2: 94 % O2 Device: None (Room air) Pain: Pain Assessment Pain Assessment: 0-10 Pain Score: 5  Pain Type: Acute pain Pain Location: Rib cage Pain Orientation: Left Pain Descriptors / Indicators: Aching;Sore Pain Onset: On-going Pain Intervention(s): RN made aware;Repositioned;Ambulation/increased activity Multiple Pain Sites: No Locomotion : Ambulation Ambulation/Gait Assistance: 5: Supervision;4: Min guard   See FIM for current functional status  Therapy/Group: Individual Therapy  Lillia Abed. Ripa, PT, DPT 06/26/2013, 2:39 PM

## 2013-06-26 NOTE — Progress Notes (Signed)
Occupational Therapy Session Note  Patient Details  Name: James [name] MRN: [id number] Date of Birth: [birth year]  Today's Date: 06/26/2013 Time: 1015-1100 Time Calculation (min): 45 min  Short Term Goals = LTGs  Skilled Therapeutic Interventions/Progress Updates:  Patient resting in recliner with LUE in arm sling and drinking coffee.  Engaged in therapeutic activity to challenge balance and SPO2.  Patient on room air yet SPO2 ranged from 86-98% depending on activity level and patient's ability to focus on pursed lip breathing.  When SPO2 down in the 80s, patient able to bring it up to greater than 90% with pursed lip breathing after ~15-20 seconds.  Patient ambulated to day room then seated rest break, to therapy apartment then rest break then back to his room from therapy apartment.  Patient required vcs to breath efficiently while he was ambulating not just when he rested.  Patient carried his coffee to day room then to therapy apartment to include walking on carpet and when asked to speed up.  No LOB or spills.  Therapy Documentation Precautions:  Precautions Precautions: Fall;Other (comment) Precaution Comments: L clavicle fracture Required Braces or Orthoses: Other Brace/Splint Other Brace/Splint: Sling for L UE Restrictions Weight Bearing Restrictions: Yes LUE Weight Bearing: Non weight bearing Pain: 6/10 pain in rib cage area, rest and repositioned  Therapy/Group: Individual Therapy  SHAFFER, CHRISTINA 06/26/2013, 5:13 PM

## 2013-06-26 NOTE — Progress Notes (Signed)
Physical Therapy Session Note  Patient Details  Name: James [name] MRN: [id number] Date of Birth: August 28, 1960  Today's Date: 06/26/2013 Time: 4825-0037 Time Calculation (min): 55 min  Short Term Goals: Week 1:  PT Short Term Goal 1 (Week 1): =LTG's due to ELOS  Skilled Therapeutic Interventions/Progress Updates:   Pt resting in recliner drinking coffee.  Pt would like to do more ambulation outside.  Transported outside in w/c total A.  Outside performed dynamic community gait training x >300' without AD and at times carrying a cup of coffee but with supervision-min A up/down ramp, uneven pavement and grass, up/down curb, up/down 5 steps with R rail with min A secondary to pt not placing foot fully on step when ascending alternating sequence; pt required multiple rest breaks for deep breathing and to assess Sp02; pt able to maintain Sp02 > 90% on RA but did demonstrate tachycardia.  Returned to [employer] and performed simulated car transfer with supervision.  Returned to room and set up in recliner with more coffee.  Therapy Documentation Precautions:  Precautions Precautions: Fall;Other (comment) Precaution Comments: L clavicle fracture Required Braces or Orthoses: Other Brace/Splint Other Brace/Splint: Sling for L UE Restrictions Weight Bearing Restrictions: Yes LUE Weight Bearing: Non weight bearing Vital Signs: Therapy Vitals Temp: 99.1 F (37.3 C) Temp src: Oral Pulse Rate: 88 Resp: 16 BP: 130/77 mmHg Patient Position (if appropriate): Sitting Oxygen Therapy SpO2: 94 % O2 Device: None (Room air) Pain: Pain Assessment Pain Assessment: No/denies pain Pain Score: 5  Pain Type: Acute pain Pain Location: Rib cage Pain Orientation: Left Pain Descriptors / Indicators: Aching;Sore Pain Onset: On-going Pain Intervention(s): RN made aware;Repositioned;Ambulation/increased activity Multiple Pain Sites: No Locomotion : Ambulation Ambulation/Gait Assistance: 5: Supervision;4: Min  guard Wheelchair Mobility Distance: 200   See FIM for current functional status  Therapy/Group: Individual Therapy  [name] [facility] 06/26/2013, 2:53 PM

## 2013-06-27 ENCOUNTER — Inpatient Hospital Stay (HOSPITAL_COMMUNITY): Payer: Self-pay

## 2013-06-27 ENCOUNTER — Encounter (HOSPITAL_COMMUNITY): Payer: Self-pay | Admitting: Occupational Therapy

## 2013-06-27 ENCOUNTER — Inpatient Hospital Stay (HOSPITAL_COMMUNITY): Payer: Self-pay | Admitting: Physical Therapy

## 2013-06-27 NOTE — Patient Care Conference (Signed)
Inpatient RehabilitationTeam Conference and Plan of Care Update Date: 06/26/2013   Time: 2:15 PM    Patient Name: James [name]      Medical Record Number: [id number]  Date of Birth: 1960/08/17 Sex: Male         Room/Bed: 4W05C/4W05C-01 Payor Info: Payor: MEDICAID POTENTIAL / Plan: MEDICAID POTENTIAL / Product Type: *No Product type* /    Admitting Diagnosis: TRAUMA  MULTI  Admit Date/Time:  06/22/2013  6:19 PM Admission Comments: No comment available   Primary Diagnosis:  <principal problem not specified> Principal Problem: <principal problem not specified>  Patient Active Problem List   Diagnosis Date Noted  . Trauma of chest 06/22/2013  . Hemothorax on left 06/11/2013  . Acute respiratory failure 06/08/2013  . Altered mental status 06/08/2013  . Pneumonia, organism unspecified 06/08/2013  . Fall from ladder 06/05/2013    Expected Discharge Date: Expected Discharge Date: 06/28/13  Team Members Present: Physician leading conference: Dr. Alger Simons Social Worker Present: Lennart Pall, LCSW Nurse Present: Elliot Cousin, RN PT Present: [name], PT OT Present: Salome Spotted, OT;Patricia Lissa Hoard, OT PPS Coordinator present : Daiva Nakayama, RN, CRRN     Current Status/Progress Goal Weekly Team Focus  Medical   fall with rib and shoulder fx's. ptx, ?old TBI  improve activity tolerance  pain mgt, surgical follow up   Bowel/Bladder   continent of bowel and bladder,on miralax and senns-s,LBM 06/24/13  remains continent w/ regular BM Q1-2 days  assess I&O,assess last stool daily,educate pt to report constipation   Swallow/Nutrition/ Hydration             ADL's   overall supervision>steady assist  overall mod I  ADL retraining, dynamic standing balance/tolerance/endurance, functional mobility, 02 support, energy conservation, activity tolerance/endurance    Mobility   Supervision-min A  Mod I  Balance, endurance, gait   Communication             Safety/Cognition/  Behavioral Observations  bed alarm,follows safety plan,reminders  safe mobility,follows safety plan,d/c to safe environment  assess for safety,round q2h,anticipate needs,keep call bell,and phone in reach   Pain   has quite a bit of pain,lidoderm patches,prn oxy,prn robaxin,takes meds regularly  pain relief,able to do therapy and assist w/ self care  assess for pain,educate pt to call for pain meds as needed   Skin   sutures to chest,left  healing w/o infection,  assess skin daily,report changes    Rehab Goals Patient on target to meet rehab goals: Yes *See Care Plan and progress notes for long and short-term goals.  Barriers to Discharge: balance, safety awareness    Possible Resolutions to Barriers:  routine, adaptive equipment, safety training    Discharge Planning/Teaching Needs:  home with sister intially where closer to 24/7 care available      Team Discussion:  Good progress overall.  Likely that balance issues were premorbid.  Having some chest wall pain.  Plan to remove chest tube sutures.  Mod i goals overall except supervision stairs.    Revisions to Treatment Plan:  None   Continued Need for Acute Rehabilitation Level of Care: The patient requires daily medical management by a physician with specialized training in physical medicine and rehabilitation for the following conditions: Daily direction of a multidisciplinary physical rehabilitation program to ensure safe treatment while eliciting the highest outcome that is of practical value to the patient.: Yes Daily medical management of patient stability for increased activity during participation in an intensive rehabilitation regime.: Yes  Daily analysis of laboratory values and/or radiology reports with any subsequent need for medication adjustment of medical intervention for : Neurological problems;Pulmonary problems;Post surgical problems  HOYLE, LUCY 06/27/2013, 7:01 AM

## 2013-06-27 NOTE — Progress Notes (Signed)
Occupational Therapy Session Note  Patient Details  Name: James [name] MRN: [id number] Date of Birth: [birth year]  Today's Date: 06/27/2013 Time: 1000-1058 Time Calculation (min): 58 min  Short Term Goals: Week 1:     Skilled Therapeutic Interventions/Progress Updates:    Pt seen for 1:1 OT session with focus on safety awareness, activity tolerance, and functional mobility. Pt ambulated room<>ADL apartment at Mod I level with 1 rest break. Pt engaged in simple meal prep task using microwave at Mod I level. Discussed home setup for safety in kitchen and energy conservation techniques. Completed simulated laundry task at Mod I level with no LOB when reaching into front and top loading machines. Engaged in LUE PROM and AROM exercises while outside at Mod I level. Pt ambulated on uneven surfaces in outdoors environment with no LOB and good safety awareness. Pt with SpO2>90% throughout session while on room air. Pt utilized pursed lip breathing throughout session at Mod I level. Pt returned to room and left in recliner chair with all needs in reach.   Therapy Documentation Precautions:  Precautions Precautions: Fall;Other (comment) Precaution Comments: L clavicle fracture Required Braces or Orthoses: Other Brace/Splint Other Brace/Splint: Sling for L UE Restrictions Weight Bearing Restrictions: Yes LUE Weight Bearing: Non weight bearing General:   Vital Signs:   Pain: Pain Assessment Pain Score: 4   See FIM for current functional status  Therapy/Group: Individual Therapy  Perkinson, Quillian Quince 06/27/2013, 1:10 PM

## 2013-06-27 NOTE — Progress Notes (Signed)
Physical Therapy Discharge Summary  Patient Details  Name: James [name] MRN: [id number] Date of Birth: 03/18/1960  Today's Date: 06/27/2013 Time: 1330-1440 Time Calculation (min): 70 min  Patient has met 9 of 9 long term goals due to improved activity tolerance, improved balance, improved postural control, increased strength and decreased pain.  Patient to discharge at an ambulatory level Modified Independent.  Pt to D/C home and Pt is Mod I with all mobility.   Reasons goals not met: All goals met  Recommendation:  Patient will benefit from ongoing skilled PT services in home health setting to continue to advance safe functional mobility, address ongoing impairments in pain and decreased ROM and strength in LUE, impaired postural control, balance, and minimize fall risk.  Equipment: No equipment provided  Reasons for discharge: treatment goals met and discharge from hospital  Patient/family agrees with progress made and goals achieved: Yes  PT Discharge Pain Pain Assessment Pain Score: 8  Pain Type: Acute pain Pain Location: Rib cage Pain Orientation: Left Pain Descriptors / Indicators: Aching Pain Intervention(s): Medication (See eMAR) Sensation Sensation Light Touch: Appears Intact Stereognosis: Not tested Hot/Cold: Not tested Proprioception: Appears Intact Coordination Gross Motor Movements are Fluid and Coordinated: Yes Motor  Motor Motor - Skilled Clinical Observations: Pt with higher level balance deficits with L clavicle fx, requiring L arm to be splinted and NWB Motor - Discharge Observations: Pt with higher level balance deficits with L clavicle fx, requiring L arm to be splinted and NWB  Mobility Bed Mobility Bed Mobility: Supine to Sit;Sit to Supine Supine to Sit: 6: Modified independent (Device/Increase time) Sit to Supine: 6: Modified independent (Device/Increase time) Transfers Stand Pivot Transfers: 6: Modified independent (Device/Increase time)  (also performed simulated car transfer mod I) Pt also demonstrated safe floor <> furniture transfer mod I and was able to verbalize indications for calling EMS. Locomotion  Ambulation Ambulation/Gait Assistance: 6: Modified independent (Device/Increase time) Ambulation Distance (Feet): 150 Feet Assistive device: None Gait Gait Pattern: Step-through pattern;Decreased step length - right;Decreased step length - left;Decreased stride length;Shuffle;Narrow base of support Stairs / Additional Locomotion Stairs Assistance: 6: Modified independent (Device/Increase time) Stair Management Technique: No rails Number of Stairs: 12 Height of Stairs: 6.5 Wheelchair Mobility Wheelchair Mobility: No  Trunk/Postural Assessment  Cervical Assessment Cervical Assessment: Within Functional Limits Thoracic Assessment Thoracic Assessment: Within Functional Limits Lumbar Assessment Lumbar Assessment: Within Functional Limits Postural Control Postural Control: Within Functional Limits  Balance Standardized Balance Assessment Standardized Balance Assessment: Berg Balance Test Berg Balance Test Sit to Stand: Able to stand without using hands and stabilize independently Standing Unsupported: Able to stand safely 2 minutes Sitting with Back Unsupported but Feet Supported on Floor or Stool: Able to sit safely and securely 2 minutes Stand to Sit: Sits safely with minimal use of hands Transfers: Able to transfer safely, minor use of hands Standing Unsupported with Eyes Closed: Able to stand 10 seconds with supervision Standing Ubsupported with Feet Together: Able to place feet together independently and stand for 1 minute with supervision From Standing, Reach Forward with Outstretched Arm: Can reach confidently >25 cm (10") From Standing Position, Pick up Object from Floor: Able to pick up shoe safely and easily From Standing Position, Turn to Look Behind Over each Shoulder: Looks behind from both sides and  weight shifts well Turn 360 Degrees: Able to turn 360 degrees safely in 4 seconds or less Standing Unsupported, Alternately Place Feet on Step/Stool: Able to stand independently and safely and complete 8 steps in  20 seconds Standing Unsupported, One Foot in Front: Able to place foot tandem independently and hold 30 seconds Standing on One Leg: Able to lift leg independently and hold > 10 seconds Total Score: 54 Patient demonstrates increased fall risk as noted by score of 54/56 (improvement of 10 points from eval) on Berg Balance Scale.  (<36= high risk for falls, close to 100%; 37-45 significant >80%; 46-51 moderate >50%; 52-55 lower >25%)  Extremity Assessment  RLE Assessment RLE Assessment: Within Functional Limits LLE Assessment LLE Assessment: Within Functional Limits  See FIM for current functional status  [name] [facility] 06/27/2013, 2:27 PM

## 2013-06-27 NOTE — Progress Notes (Signed)
[facility] PHYSICAL MEDICINE & REHABILITATION     PROGRESS NOTE    Subjective/Complaints: No new issues. Anxious to get home. A  review of systems has been performed and if not noted above is otherwise negative.   Objective: Vital Signs: Blood pressure 118/76, pulse 91, temperature 99 F (37.2 C), temperature source Oral, resp. rate 16, height 5\' 9"  (1.753 m), weight 63.4 kg (139 lb 12.4 oz), SpO2 99.00%. Dg Chest 2 View  06/25/2013   CLINICAL DATA:  Status post left VATS  EXAM: CHEST  2 VIEW  COMPARISON:  06/22/2013  FINDINGS: Cardiac shadow is stable. Persistent left basilar changes are seen. No definitive pneumothorax is noted. Multiple left rib fractures are again noted. The right lung remains clear. Rib fractures are noted on right is well. A left clavicular fracture is again seen.  IMPRESSION: Persistent left basilar changes.  Chronic changes as described.   Electronically Signed   By: Inez Catalina M.D.   On: 06/25/2013 10:55    Recent Labs  06/25/13 0430  WBC 8.4  HGB 11.1*  HCT 34.4*  PLT 417*    Recent Labs  06/25/13 0430  NA 139  K 4.5  CL 99  GLUCOSE 104*  BUN 17  CREATININE 0.63  CALCIUM 9.4   CBG (last 3)  No results found for this basename: GLUCAP,  in the last 72 hours  Wt Readings from Last 3 Encounters:  06/27/13 63.4 kg (139 lb 12.4 oz)  06/22/13 67.949 kg (149 lb 12.8 oz)  06/22/13 67.949 kg (149 lb 12.8 oz)    Physical Exam:  Constitutional: He is oriented to person, place, and time. He appears well-developed.  HENT:  Head: Normocephalic.  Eyes: EOM are normal.  Neck: Normal range of motion. Neck supple. No thyromegaly present.  Cardiovascular: Normal rate and regular rhythm.  Respiratory:  Decreased breath sounds at the bases    GI: Soft. Bowel sounds are normal. He exhibits no distension.  Neurological: He is alert and oriented to person, place, and time. No cranial nerve deficit. Coordination normal.  Moves all 4s . Sensation appears  grossly intact.  Skin: Skin is warm and dry.  VATS site clean and dry. Sutures intact. Chest wall, no drainage Psychiatric: He has a normal mood and affect. His behavior is normal   Assessment/Plan: 1. Functional deficits secondary to polytrauma after fall which require 3+ hours per day of interdisciplinary therapy in a comprehensive inpatient rehab setting. Physiatrist is providing close team supervision and 24 hour management of active medical problems listed below. Physiatrist and rehab team continue to assess barriers to discharge/monitor patient progress toward functional and medical goals. FIM: FIM - Bathing Bathing Steps Patient Completed: Left Arm Bathing: 2: Max-Patient completes 3-4 6f 10 parts or 25-49%  FIM - Upper Body Dressing/Undressing Upper body dressing/undressing: 0: Activity did not occur FIM - Lower Body Dressing/Undressing Lower body dressing/undressing: 0: Activity did not occur  FIM - Toileting Toileting steps completed by patient: Adjust clothing prior to toileting;Performs perineal hygiene;Adjust clothing after toileting Toileting Assistive Devices: Grab bar or rail for support Toileting: 4: Steadying assist  FIM - Air cabin crew Transfers: 4-To toilet/BSC: Min A (steadying Pt. > 75%);4-From toilet/BSC: Min A (steadying Pt. > 75%)  FIM - Bed/Chair Transfer Bed/Chair Transfer Assistive Devices: Arm rests Bed/Chair Transfer: 5: Chair or W/C > Bed: Supervision (verbal cues/safety issues);5: Bed > Chair or W/C: Supervision (verbal cues/safety issues)  FIM - Locomotion: Wheelchair Distance: 200 Locomotion: Wheelchair: 1: Total Assistance/staff  pushes wheelchair (Pt<25%) FIM - Locomotion: Ambulation Locomotion: Ambulation Assistive Devices: Other (comment) (none) Ambulation/Gait Assistance: 5: Supervision;4: Min guard Locomotion: Ambulation: 4: Travels 150 ft or more with minimal assistance (Pt.>75%)  Comprehension Comprehension Mode:  Auditory Comprehension: 5-Understands complex 90% of the time/Cues < 10% of the time  Expression Expression Mode: Verbal Expression: 6-Expresses complex ideas: With extra time/assistive device  Social Interaction Social Interaction: 6-Interacts appropriately with others with medication or extra time (anti-anxiety, antidepressant).  Problem Solving Problem Solving: 6-Solves complex problems: With extra time  Memory Memory: 6-More than reasonable amt of time  Medical Problem List and Plan:  1. Functional deficits secondary to left clavicle, rib fxs, ptx and flail chest after fall  2. DVT Prophylaxis/Anticoagulation: Mechanical: Sequential compression devices, below knee Bilateral lower extremities  3. Pain Management: overall improved  -Will continue prn oxycodone.   - lidoderm patches for rib pain-  4. Mood: Stable==egosupport as able.  5. Neuropsych: This patient is capable of making decisions on her own behalf.  6. ABLA:   iron supplement  7. Pneumothorax s/p VATS: Continue IS with flutter valve. On duonebs tid.  8. Left Clavicle fracture: Is to be NWB LUE with figure 8 splint for support.  9. LLL PNA: Reactive leucocytosis resolved. Dc abx  -recheck cxr tomorrow  -afebrile at present 10 Malnutrition:   protein supplement. Push po  LOS (Days) 5 A FACE TO FACE EVALUATION WAS PERFORMED  SWARTZ,ZACHARY T 06/27/2013 8:13 AM

## 2013-06-27 NOTE — Progress Notes (Signed)
Social Work Patient ID: James [name], male   DOB: [birth year], 53 y.o.   MRN: [id number]  Met with pt late morning to review team conference.  Pt aware and agreeable with d/c tomorrow.  Reports he had an argument with sister last night and no longer plans to d/c to her home.  He now plans to d/c to his own home alone/ intermittent assist from friends/ neighbors.  Have discussed this change in plan with therapies and no one with any concern about him having only intermittent support at home.  Pt no longer receiving IV abx.  I will arrange a few HHPT, RN and SW visits.  No DME needs.  Ready for d/c tomorrow.  HOYLE, LUCY, LCSW

## 2013-06-27 NOTE — Progress Notes (Signed)
Occupational Therapy Session Note & Discharge Summary  Patient Details  Name: James [name] MRN: [id number] Date of Birth: [birth year]  Today's Date: 06/27/2013  SESSION NOTE 4680-3212 - 60 Minutes Individual Therapy Patient with 6/10 complaints of pain in left rib area, RN aware *Grad Day!* Patient received seated in bed drinking coffee. Patient with request to finish coffee before starting therapy. Therapist talked with patient about discharge planning and community re-entry aspects. Patient informed therapist that he no longer will be discharging > his sisters house, instead he will be discharging to his house. SW notified of this. After coffee, patient ambulated throughout room to gather all necessary items for his routine ADL; patient independent for functional ambulation and simple house keeping task. Patient then ambulated > ADL apartment and performed UB/LB bathing and dressing. Patient bathed in tub at mod I level and completed dressing independently. Patient able to don/doff sling independently as well. Therapist covered IV site and sutures for bath. Patient took seated rest breaks appropriately and prn throughout session and 02 sats remained <90% on room air. Patient ambulated back to room, completed grooming tasks independently and therapist made patient independent/mod I within room; RN and NT notified.   ------------------------------------------------------------------------------------------------------------------------------  DISCHARGE SUMMARY Patient has met 13 of 13 long term goals due to improved activity tolerance, improved balance, postural control, ability to compensate for deficits, functional use of  LEFT upper extremity, improved attention, improved awareness and improved coordination.  Patient to discharge at overall Independent -> Modified Independent level.  No family has been present for any education. Originally, patient was supposed to discharge > sisters house. Upon  entering room on grad day, patient stated he would be just going home because him and his sister had an argument.    Reasons goals not met: n/a, all goals met at this time.   Recommendation: No additional occupational therapy recommended at this time.  Equipment: No equipment provided, patient sits to take baths at home and does not need a BSC.  Reasons for discharge: treatment goals met and discharge from hospital  Patient/family agrees with progress made and goals achieved: Yes  Precautions/Restrictions  Precautions Precautions: Fall;Other (comment) Precaution Comments: L clavicle fracture Required Braces or Orthoses: Other Brace/Splint Other Brace/Splint: Sling for L UE Restrictions Weight Bearing Restrictions: Yes LUE Weight Bearing: Non weight bearing  Vital Signs Therapy Vitals Temp: 99 F (37.2 C) Temp src: Oral Pulse Rate: 91 Resp: 16 BP: 118/76 mmHg Patient Position (if appropriate): Lying Oxygen Therapy SpO2: 99 % O2 Device: None (Room air)  ADL - See FIM  Vision/Perception  Vision- History Baseline Vision/History: No visual deficits Vision- Assessment Eye Alignment: Within Functional Limits   Cognition Overall Cognitive Status: Within Functional Limits for tasks assessed Arousal/Alertness: Awake/alert Orientation Level: Oriented X4 Attention: Alternating Alternating Attention: Appears intact Memory: Appears intact Awareness: Appears intact Safety/Judgment: Appears intact  Sensation Sensation Light Touch: Appears Intact Proprioception: Appears Intact Coordination Gross Motor Movements are Fluid and Coordinated: Yes Fine Motor Movements are Fluid and Coordinated: Yes  Motor  Motor Motor - Skilled Clinical Observations: Pt with higher level balance deficits with L clavicle fx, requiring L arm to be splinted and NWB  Mobility  Bed Mobility Bed Mobility: Supine to Sit;Sit to Supine Supine to Sit: 6: Modified independent (Device/Increase  time) Sit to Supine: 6: Modified independent (Device/Increase time)   Trunk/Postural Assessment  Cervical Assessment Cervical Assessment: Within Functional Limits Thoracic Assessment Thoracic Assessment: Within Functional Limits Lumbar Assessment Lumbar Assessment: Within Functional Limits Postural  Control Postural Control: Within Functional Limits   Balance Standardized Balance Assessment Standardized Balance Assessment: Berg Balance Test Berg Balance Test Sit to Stand: Able to stand without using hands and stabilize independently Standing Unsupported: Able to stand safely 2 minutes Sitting with Back Unsupported but Feet Supported on Floor or Stool: Able to sit safely and securely 2 minutes Stand to Sit: Sits safely with minimal use of hands Transfers: Able to transfer safely, minor use of hands Standing Unsupported with Eyes Closed: Able to stand 10 seconds with supervision Standing Ubsupported with Feet Together: Able to place feet together independently and stand for 1 minute with supervision From Standing, Reach Forward with Outstretched Arm: Can reach confidently >25 cm (10") From Standing Position, Pick up Object from Floor: Able to pick up shoe safely and easily From Standing Position, Turn to Look Behind Over each Shoulder: Looks behind from both sides and weight shifts well Turn 360 Degrees: Able to turn 360 degrees safely in 4 seconds or less Standing Unsupported, Alternately Place Feet on Step/Stool: Able to stand independently and safely and complete 8 steps in 20 seconds Standing Unsupported, One Foot in Front: Able to place foot tandem independently and hold 30 seconds Standing on One Leg: Able to lift leg independently and hold > 10 seconds Total Score: 54  Extremity/Trunk Assessment RUE Assessment RUE Assessment: Within Functional Limits LUE Assessment LUE Assessment: Exceptions to [facility] LUE PROM (degrees) Overall PROM Left Upper Extremity: Deficits (patient with  clavicle fracture and is NWB>LUE, AROM is [facility])  See FIM for current functional status  CLAY,PATRICIA 06/27/2013, 3:53 PM

## 2013-06-28 ENCOUNTER — Inpatient Hospital Stay (HOSPITAL_COMMUNITY): Payer: Self-pay

## 2013-06-28 LAB — CBC
HEMATOCRIT: 31 % — AB (ref 39.0–52.0)
HEMOGLOBIN: 8.9 g/dL — AB (ref 13.0–17.0)
MCH: 22.1 pg — ABNORMAL LOW (ref 26.0–34.0)
MCHC: 28.7 g/dL — ABNORMAL LOW (ref 30.0–36.0)
MCV: 77.1 fL — ABNORMAL LOW (ref 78.0–100.0)
Platelets: 259 10*3/uL (ref 150–400)
RBC: 4.02 MIL/uL — AB (ref 4.22–5.81)
RDW: 25.8 % — ABNORMAL HIGH (ref 11.5–15.5)
WBC: 8 10*3/uL (ref 4.0–10.5)

## 2013-06-28 MED ORDER — OXYCODONE HCL 15 MG PO TABS
15.0000 mg | ORAL_TABLET | ORAL | Status: DC | PRN
Start: 2013-06-28 — End: 2013-07-20

## 2013-06-28 MED ORDER — METHOCARBAMOL 500 MG PO TABS: 500.0000 mg | ORAL_TABLET | Freq: Four times a day (QID) | ORAL | Status: DC | PRN

## 2013-06-28 MED ORDER — FOLIC ACID 1 MG PO TABS: 1.0000 mg | ORAL_TABLET | Freq: Every day | ORAL | Status: DC

## 2013-06-28 MED ORDER — LIDOCAINE 5 % EX PTCH: 2.0000 | MEDICATED_PATCH | CUTANEOUS | Status: DC

## 2013-06-28 MED ORDER — TAMSULOSIN HCL 0.4 MG PO CAPS: 0.4000 mg | ORAL_CAPSULE | Freq: Every day | ORAL | Status: DC

## 2013-06-28 MED ORDER — POLYSACCHARIDE IRON COMPLEX 150 MG PO CAPS: 150.0000 mg | ORAL_CAPSULE | Freq: Two times a day (BID) | ORAL | Status: DC

## 2013-06-28 MED ORDER — NICOTINE 14 MG/24HR TD PT24: MEDICATED_PATCH | TRANSDERMAL | Status: DC

## 2013-06-28 NOTE — Discharge Instructions (Signed)
Inpatient Rehab Discharge Instructions  James [name] Discharge date and time: No discharge date for patient encounter.   Activities/Precautions/ Functional Status: Activity: Nonweightbearing left upper extremity with shoulder sling Diet: regular diet Wound Care: none needed Functional status:  ___ No restrictions     ___ Walk up steps independently ___ 24/7 supervision/assistance   ___ Walk up steps with assistance ___ Intermittent supervision/assistance  ___ Bathe/dress independently ___ Walk with walker     ___ Bathe/dress with assistance ___ Walk Independently    ___ Shower independently _x__ Walk with assistance    ___ Shower with assistance ___ No alcohol     ___ Return to work/school ________    COMMUNITY REFERRALS UPON DISCHARGE:    Home Health:   PT      RN       SW                    Agency: [facility] Phone: [phone]      Special Instructions: No driving   My questions have been answered and I understand these instructions. I will adhere to these goals and the provided educational materials after my discharge from the hospital.  Patient/Caregiver Signature _______________________________ Date __________  Clinician Signature _______________________________________ Date __________  Please bring this form and your medication list with you to all your follow-up doctor's appointments.

## 2013-06-28 NOTE — Progress Notes (Signed)
06/28/13 1240 nsg Patient discharged to home with family; pt preferred to walk with family NT walked with patient CN aware. Discharge orders discussed by PA pt has all his paper works.

## 2013-06-28 NOTE — Progress Notes (Signed)
[facility] PHYSICAL MEDICINE & REHABILITATION     PROGRESS NOTE    Subjective/Complaints: No new issues. Pain reasonable. Excited to get home A  review of systems has been performed and if not noted above is otherwise negative.   Objective: Vital Signs: Blood pressure 147/87, pulse 81, temperature 99.5 F (37.5 C), temperature source Oral, resp. rate 16, height 5\' 9"  (1.753 m), weight 65.046 kg (143 lb 6.4 oz), SpO2 94.00%. No results found.  Recent Labs  06/28/13 0452  WBC 8.0  HGB 8.9*  HCT 31.0*  PLT 259   No results found for this basename: NA, K, CL, CO, GLUCOSE, BUN, CREATININE, CALCIUM,  in the last 72 hours CBG (last 3)  No results found for this basename: GLUCAP,  in the last 72 hours  Wt Readings from Last 3 Encounters:  06/27/13 65.046 kg (143 lb 6.4 oz)  06/22/13 67.949 kg (149 lb 12.8 oz)  06/22/13 67.949 kg (149 lb 12.8 oz)    Physical Exam:  Constitutional: He is oriented to person, place, and time. He appears well-developed.  HENT:  Head: Normocephalic.  Eyes: EOM are normal.  Neck: Normal range of motion. Neck supple. No thyromegaly present.  Cardiovascular: Normal rate and regular rhythm.  Respiratory:  Decreased breath sounds at the bases    GI: Soft. Bowel sounds are normal. He exhibits no distension.  Neurological: He is alert and oriented to person, place, and time. No cranial nerve deficit. Coordination normal.  Moves all 4s . Sensation appears grossly intact.  Skin: Skin is warm and dry.  VATS site clean and dry. Sutures intact. Chest wall, no drainage Psychiatric: He has a normal mood and affect. His behavior is normal   Assessment/Plan: 1. Functional deficits secondary to polytrauma after fall which require 3+ hours per day of interdisciplinary therapy in a comprehensive inpatient rehab setting. Physiatrist is providing close team supervision and 24 hour management of active medical problems listed below. Physiatrist and rehab team  continue to assess barriers to discharge/monitor patient progress toward functional and medical goals.  Reviewed safety and health hygiene with pt today.   FIM: FIM - Bathing Bathing Steps Patient Completed: Chest;Right Arm;Left Arm;Abdomen;Front perineal area;Buttocks;Right upper leg;Left upper leg;Right lower leg (including foot);Left lower leg (including foot) Bathing: 7: Complete Independence: No helper  FIM - Upper Body Dressing/Undressing Upper body dressing/undressing steps patient completed: Thread/unthread right sleeve of pullover shirt/dresss;Thread/unthread left sleeve of pullover shirt/dress;Put head through opening of pull over shirt/dress;Pull shirt over trunk (including donning/doffing of sling) Upper body dressing/undressing: 7: Complete Independence: No helper FIM - Lower Body Dressing/Undressing Lower body dressing/undressing steps patient completed: Thread/unthread right underwear leg;Thread/unthread left underwear leg;Pull underwear up/down;Thread/unthread right pants leg;Thread/unthread left pants leg;Pull pants up/down;Don/Doff right sock;Don/Doff left sock;Don/Doff right shoe;Don/Doff left shoe;Fasten/unfasten pants Lower body dressing/undressing: 7: Complete Independence: No helper  FIM - Toileting Toileting steps completed by patient: Adjust clothing prior to toileting;Performs perineal hygiene;Adjust clothing after toileting Toileting Assistive Devices: Grab bar or rail for support Toileting: 7: Independent: No helper, no device  FIM - Radio producer Devices: Elevated toilet seat;Grab bars Toilet Transfers: 6-Assistive device: No helper  FIM - Control and instrumentation engineer Devices: Arm rests Bed/Chair Transfer: 6: Supine > Sit: No assist;6: Sit > Supine: No assist;6: Bed > Chair or W/C: No assist;6: Chair or W/C > Bed: No assist  FIM - Locomotion: Wheelchair Distance: 200 Locomotion: Wheelchair: 0: Activity did  not occur FIM - Locomotion: Ambulation Locomotion: Ambulation Assistive Devices:  Other (comment) (none) Ambulation/Gait Assistance: 6: Modified independent (Device/Increase time) Locomotion: Ambulation: 6: Travels 150 ft or more independently/takes more than reasonable amount of time  Comprehension Comprehension Mode: Auditory Comprehension: 7-Follows complex conversation/direction: With no assist  Expression Expression Mode: Verbal Expression: 7-Expresses complex ideas: With no assist  Social Interaction Social Interaction: 7-Interacts appropriately with others - No medications needed.  Problem Solving Problem Solving: 7-Solves complex problems: Recognizes & self-corrects  Memory Memory: 7-Complete Independence: No helper  Medical Problem List and Plan:  1. Functional deficits secondary to left clavicle, rib fxs, ptx and flail chest after fall  2. DVT Prophylaxis/Anticoagulation: Mechanical: Sequential compression devices, below knee Bilateral lower extremities  3. Pain Management: overall improved  -Will continue prn oxycodone.   - lidoderm patches for rib pain- may not be able to afford once home--?icey hot patches 4. Mood: Stable==egosupport as able.  5. Neuropsych: This patient is capable of making decisions on her own behalf.  6. ABLA:   iron supplement  7. Pneumothorax s/p VATS: Continue IS with flutter valve. On duonebs tid.  8. Left Clavicle fracture: Is to be NWB LUE with figure 8 splint for support.  9. LLL PNA: Reactive leucocytosis resolved. Dc'ed abx  -follow up cxr today  -afebrile at present 10 Malnutrition:   protein supplement. Push po  LOS (Days) 6 A FACE TO FACE EVALUATION WAS PERFORMED  SWARTZ,ZACHARY T 06/28/2013 8:11 AM

## 2013-06-28 NOTE — Progress Notes (Signed)
06/28/13 1115 nsg Sutures removed per order. Pt tolerated co complaints.

## 2013-07-02 NOTE — Progress Notes (Signed)
Social Work  Discharge Note  The overall goal for the admission was met for:   Discharge location: Yes - returned to his own home with intermittent assist of friends  Length of Stay: Yes - 6 days  Discharge activity level: Yes - modified independent  Home/community participation: Yes  Services provided included: MD, RD, PT, OT, SLP, RN, TR, Pharmacy and SW  Financial Services: Other: NONE  Follow-up services arranged: Home Health: RN, PT, SW via [facility] and Patient/Family has no preference for HH/DME agencies  Comments (or additional information): connected pt with [facility] medication assistance program  Patient/Family verbalized understanding of follow-up arrangements: Yes  Individual responsible for coordination of the follow-up plan: patient  Confirmed correct DME delivered: NA - none recommended    Lakesia Dahle

## 2013-07-03 DIAGNOSIS — S225XXA Flail chest, initial encounter for closed fracture: Secondary | ICD-10-CM

## 2013-07-03 DIAGNOSIS — D62 Acute posthemorrhagic anemia: Secondary | ICD-10-CM | POA: Diagnosis present

## 2013-07-03 DIAGNOSIS — S42009A Fracture of unspecified part of unspecified clavicle, initial encounter for closed fracture: Secondary | ICD-10-CM

## 2013-07-03 DIAGNOSIS — T07XXXA Unspecified multiple injuries, initial encounter: Secondary | ICD-10-CM | POA: Diagnosis present
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
## 2013-07-20 ENCOUNTER — Telehealth: Payer: Self-pay | Admitting: *Deleted

## 2013-07-20 MED ORDER — OXYCODONE HCL 15 MG PO TABS: 15.0000 mg | ORAL_TABLET | ORAL | Status: DC | PRN

## 2013-07-20 NOTE — Telephone Encounter (Signed)
Call received about getting James [name] in to see Dr Naaman Plummer.  He is out of his medications.  I reviewed and he has refills on everything but the pain medication.  Dr Naaman Plummer agreed to refill once more but after that must be seen. I have printed out a refill for Dr Naaman Plummer to sign.  Mr James [name] does not have a picture in chart and has no picture id.  I spoke with Algis Liming to help identify him and he was able to give [place] his SS# so we photographed him and gave him a hospital follow up appt in Sept (1st available).  We will place him on cancellation list and call to come in when one becomes available. I explained to Mr Stollings that he is to bring bottle to appt even if it is empty, and even though it says may take q 4 hr prn there are only #90 pills so he can take no more than 3 per day to stay in the one month supply limit.  He acknowledged understanding.  I also told him no other MD should be prescribing narcotics while Dr Naaman Plummer is prescribing them for him.

## 2013-08-15 ENCOUNTER — Emergency Department (HOSPITAL_COMMUNITY)
Admission: EM | Admit: 2013-08-15 | Discharge: 2013-08-15 | Disposition: A | Discharge status: Home / Self Care | Payer: Self-pay | Attending: Emergency Medicine | Admitting: Emergency Medicine

## 2013-08-15 ENCOUNTER — Encounter (HOSPITAL_COMMUNITY): Payer: Self-pay | Admitting: Emergency Medicine

## 2013-08-15 DIAGNOSIS — M79602 Pain in left arm: Secondary | ICD-10-CM

## 2013-08-15 DIAGNOSIS — Z791 Long term (current) use of non-steroidal anti-inflammatories (NSAID): Secondary | ICD-10-CM | POA: Insufficient documentation

## 2013-08-15 DIAGNOSIS — Z79899 Other long term (current) drug therapy: Secondary | ICD-10-CM | POA: Insufficient documentation

## 2013-08-15 DIAGNOSIS — Z8709 Personal history of other diseases of the respiratory system: Secondary | ICD-10-CM | POA: Insufficient documentation

## 2013-08-15 DIAGNOSIS — M792 Neuralgia and neuritis, unspecified: Secondary | ICD-10-CM

## 2013-08-15 DIAGNOSIS — IMO0002 Reserved for concepts with insufficient information to code with codable children: Secondary | ICD-10-CM | POA: Insufficient documentation

## 2013-08-15 DIAGNOSIS — M79609 Pain in unspecified limb: Secondary | ICD-10-CM | POA: Insufficient documentation

## 2013-08-15 MED ORDER — OXYCODONE-ACETAMINOPHEN 5-325 MG PO TABS: 1.0000 | ORAL_TABLET | Freq: Four times a day (QID) | ORAL | Status: DC | PRN

## 2013-08-15 MED ORDER — MELOXICAM 7.5 MG PO TABS: 15.0000 mg | ORAL_TABLET | Freq: Every day | ORAL | Status: DC

## 2013-08-15 NOTE — ED Notes (Signed)
Patient states he fell off a ladder in June and was hospitalized / surgery.  Patient states that since that time he has had arm pain ever since.   Patient states sometimes has grip problems with numbness.  Denies other symptoms.   Patient states he has had pain/numbness/grip problems since he fell off ladder in June.

## 2013-08-15 NOTE — ED Provider Notes (Signed)
CSN: [id number]     Arrival date & time 08/15/13  1026 History   First MD Initiated Contact with Patient 08/15/13 1143     Chief Complaint  Patient presents with  . Arm Pain     (Consider location/radiation/quality/duration/timing/severity/associated sxs/prior Treatment) HPI Pt is a 53yo male presenting to ED with c/o left arm pain, numbness, and decreased grip strength since he fell off ladder in June 2015.  Pt reports breaking 9 ribs on left side that required surgery but denies having surgery on his neck or left arm. Denies neck pain.  Denies new falls or injuries. Denies being evaluated by orthopedist or neurosurgery for same complaints. Nothing makes symptoms better or worse.    Past Medical History  Diagnosis Date  . Pneumothorax    Past Surgical History  Procedure Laterality Date  . Video bronchoscopy N/A 06/11/2013    Procedure: VIDEO BRONCHOSCOPY;  Surgeon: Ivin Poot, MD;  Location: [facility] OR;  Service: Thoracic;  Laterality: N/A;  . Video assisted thoracoscopy (vats)/thorocotomy Left 06/11/2013    Procedure: VIDEO ASSISTED THORACOSCOPY (VATS)/THOROCOTOMY;  Surgeon: Ivin Poot, MD;  Location: [facility] OR;  Service: Thoracic;  Laterality: Left;   Family History  Problem Relation Age of Onset  . Heart disease Father    History  Substance Use Topics  . Smoking status: Former Smoker    Types: Cigarettes  . Smokeless tobacco: Not on file  . Alcohol Use: Yes    Review of Systems  Constitutional: Negative for fever and chills.  Musculoskeletal: Positive for myalgias ( left forearm and hand). Negative for arthralgias.  Skin: Negative for rash and wound.  Neurological: Positive for weakness and numbness. Negative for dizziness, light-headedness and headaches.  All other systems reviewed and are negative.     Allergies  Review of patient's allergies indicates no known allergies.  Home Medications   Prior to Admission medications   Medication Sig Start Date End Date  Taking? Authorizing Provider  folic acid (FOLVITE) 1 MG tablet Take 1 tablet (1 mg total) by mouth daily. 06/28/13  Yes Daniel J Angiulli, PA-C  iron polysaccharides (NIFEREX) 150 MG capsule Take 1 capsule (150 mg total) by mouth 2 (two) times daily before lunch and supper. 06/28/13  Yes Daniel J Angiulli, PA-C  methocarbamol (ROBAXIN) 500 MG tablet Take 1 tablet (500 mg total) by mouth every 6 (six) hours as needed for muscle spasms. 06/28/13  Yes Daniel J Angiulli, PA-C  oxyCODONE (ROXICODONE) 15 MG immediate release tablet Take 15 mg by mouth every 4 (four) hours as needed for pain.   Yes Historical Provider, MD  tamsulosin (FLOMAX) 0.4 MG CAPS capsule Take 1 capsule (0.4 mg total) by mouth daily after supper. 06/28/13  Yes Daniel J Angiulli, PA-C  meloxicam (MOBIC) 7.5 MG tablet Take 2 tablets (15 mg total) by mouth daily. 08/15/13   Noland Fordyce, PA-C  oxyCODONE-acetaminophen (PERCOCET/ROXICET) 5-325 MG per tablet Take 1-2 tablets by mouth every 6 (six) hours as needed for moderate pain or severe pain. 08/15/13   Noland Fordyce, PA-C   BP 120/70  Pulse 90  Temp(Src) 98 F (36.7 C) (Oral)  Resp 18  Ht 5\' 9"  (1.753 m)  Wt 142 lb (64.411 kg)  BMI 20.96 kg/m2  SpO2 99% Physical Exam  Nursing note and vitals reviewed. Constitutional: He is oriented to person, place, and time. He appears well-developed and well-nourished.  HENT:  Head: Normocephalic and atraumatic.  Eyes: EOM are normal.  Neck: Normal range of motion. Neck  supple.  No midline bone tenderness, no crepitus or step-offs.   Cardiovascular: Normal rate, regular rhythm and normal heart sounds.   Pulses:      Radial pulses are 2+ on the left side.  Cap refill digits of Left hand: <3 seconds  Pulmonary/Chest: Effort normal and breath sounds normal. No respiratory distress. He has no wheezes. He has no rales. He exhibits no tenderness.  Well healing surgical scar in left lateral chest. Lungs: CTAB  Musculoskeletal: Normal range of  motion. He exhibits no edema and no tenderness.  FROM left shoulder, elbow and wrist. 4/5 strength in left arm vs right.   Neurological: He is alert and oriented to person, place, and time.  Decreased sensation to light and sharp touch in left arm vs right.   Skin: Skin is warm and dry. No rash noted. No erythema.  Psychiatric: He has a normal mood and affect. His behavior is normal.    ED Course  Procedures (including critical care time) Labs Review Labs Reviewed - No data to display  Imaging Review No results found.   EKG Interpretation None      MDM   Final diagnoses:  Left arm pain  Radicular pain in left arm    Pt is a 53yo male c/o left arm pain. Has been constant since fall in June 2015.  No muscle atrophy. FROM left arm, only mild decreased strength left arm compared to right.  Left arm is neurovascularly in tact.  Discussed pt with Dr. Aline Brochure who also examined pt.  Agreed pt may f/u with his PCP Dr. Alger Simons for further evaluation of left arm pain that appears to be radicular pain.  Do not believe emergent MRI needed at his time. Return precautions provided. Pt verbalized understanding and agreement with tx plan.     Noland Fordyce, PA-C 08/15/13 1316

## 2013-08-16 NOTE — ED Provider Notes (Signed)
Medical screening examination/treatment/procedure(s) were conducted as a shared visit with non-physician practitioner(s) and myself.  I personally evaluated the patient during the encounter.   EKG Interpretation None      I interviewed and examined the patient. Lungs are CTAB. Cardiac exam wnl. Abdomen soft. 4/5 strength in LUE, mildly weakened grip in LUE. Sensation grossly intact in LUE although subj altered sensation to light touch diffusely in LUE. 2+ prox/dist pulses in LUE. FROM in LUE otherwise. No acute change. Sx have been going on for weeks. Pt needs non-emergent MRI. Will rec he f/u w/ pcp and return for any worsening.    Pamella Pert, MD 08/16/13 [phone]

## 2013-09-07 ENCOUNTER — Encounter: Payer: Self-pay | Admitting: Physical Medicine & Rehabilitation

## 2013-09-11 ENCOUNTER — Encounter: Payer: Self-pay | Admitting: Physical Medicine & Rehabilitation

## 2013-09-11 ENCOUNTER — Encounter: Payer: MEDICAID | Attending: Physical Medicine & Rehabilitation | Admitting: Physical Medicine & Rehabilitation

## 2013-09-11 VITALS — BP 148/97 | HR 80 | Resp 16 | Ht 69.0 in | Wt 157.0 lb

## 2013-09-11 DIAGNOSIS — S225XXS Flail chest, sequela: Secondary | ICD-10-CM

## 2013-09-11 DIAGNOSIS — X58XXXS Exposure to other specified factors, sequela: Secondary | ICD-10-CM | POA: Insufficient documentation

## 2013-09-11 DIAGNOSIS — G5620 Lesion of ulnar nerve, unspecified upper limb: Secondary | ICD-10-CM

## 2013-09-11 DIAGNOSIS — G5622 Lesion of ulnar nerve, left upper limb: Secondary | ICD-10-CM | POA: Insufficient documentation

## 2013-09-11 DIAGNOSIS — S42002S Fracture of unspecified part of left clavicle, sequela: Secondary | ICD-10-CM

## 2013-09-11 DIAGNOSIS — IMO0002 Reserved for concepts with insufficient information to code with codable children: Secondary | ICD-10-CM

## 2013-09-11 DIAGNOSIS — S42309S Unspecified fracture of shaft of humerus, unspecified arm, sequela: Secondary | ICD-10-CM

## 2013-09-11 MED ORDER — AMITRIPTYLINE HCL 25 MG PO TABS: 25.0000 mg | ORAL_TABLET | Freq: Every day | ORAL | Status: DC

## 2013-09-11 NOTE — Patient Instructions (Signed)
TAKE NAPROXEN 220MG  --TAKE TWICE DAILY WITH FOOD  ELBOW PAD FOR LEFT ELBOW    ICE LEFT ELBOW THREE X DAILY. YOU CAN ALSO TRY HEAT TO SEE IF IT HELPS.    TRY TO KEEP LEFT ELBOW EXTENDED WHEN YOU SLEEP.

## 2013-09-11 NOTE — Progress Notes (Signed)
Subjective:    Patient ID: James [name], male    DOB: 1960/05/11, 53 y.o.   MRN: [id number]  HPI  James [name] is back regarding his multiple injuries. He was with [place] in June of this year. He is having pain in his ribs and complains of weakness, numbness, and pain in the left arm. He states that the left arm limits him in performing every day activities at home. The left hand is numb as well.   He noticed symptoms in the left arm once he left the hospital. Symptoms gradually became worse leading to an ED visit last month.     Pain Inventory Average Pain 8 Pain Right Now 7 My pain is constant, sharp and burning  In the last 24 hours, has pain interfered with the following? General activity 6 Relation with others 5 Enjoyment of life 5 What TIME of day is your pain at its worst? morning,evening, night Sleep (in general) Poor  Pain is worse with: walking, bending and some activites Pain improves with: medication Relief from Meds: 7  Mobility walk without assistance how many minutes can you walk? 30-60 ability to climb steps?  yes do you drive?  no transfers alone Do you have any goals in this area?  yes  Function not employed: date last employed na  Neuro/Psych numbness  Prior Studies Any changes since last visit?  no  Physicians involved in your care Any changes since last visit?  no   Family History  Problem Relation Age of Onset  . Heart disease Father    History   Social History  . Marital Status: Divorced    Spouse Name: N/A    Number of Children: N/A  . Years of Education: N/A   Social History Main Topics  . Smoking status: Former Smoker    Types: Cigarettes  . Smokeless tobacco: None  . Alcohol Use: Yes  . Drug Use: Yes    Special: Marijuana  . Sexual Activity: None   Other Topics Concern  . None   Social History Narrative  . None   Past Surgical History  Procedure Laterality Date  . Video bronchoscopy N/A 06/11/2013    Procedure:  VIDEO BRONCHOSCOPY;  Surgeon: Ivin Poot, MD;  Location: [place];  Service: Thoracic;  Laterality: N/A;  . Video assisted thoracoscopy (vats)/thorocotomy Left 06/11/2013    Procedure: VIDEO ASSISTED THORACOSCOPY (VATS)/THOROCOTOMY;  Surgeon: Ivin Poot, MD;  Location: [facility];  Service: Thoracic;  Laterality: Left;   Past Medical History  Diagnosis Date  . Pneumothorax    BP 148/97  Pulse 80  Resp 16  Ht 5\' 9"  (1.753 m)  Wt 157 lb (71.215 kg)  BMI 23.17 kg/m2  SpO2 97%  Opioid Risk Score:   Fall Risk Score: Moderate Fall Risk (6-13 points) (pt educated declined handout)   Review of Systems  Respiratory: Positive for shortness of breath.   Neurological: Positive for numbness.  All other systems reviewed and are negative.      Objective:   Physical Exam Constitutional: He is oriented to person, place, and time. He appears well-developed. 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  Eyes: EOM are normal.  Neck: Normal range of motion. Neck supple. No thyromegaly present.  Cardiovascular: Normal rate and regular rhythm.  Respiratory:    GI: Soft. Bowel sounds are normal. He exhibits no distension.  Neurological: He is alert and oriented to  person, place, and time. No cranial nerve deficit. Coordination normal. Wrist slightly weak with extension Moves all 4s . Sensation appears grossly intact.  Skin: Skin is warm and dry.   Psychiatric: He has a normal mood and affect. His behavior is normal  M/S: pain at medial epicondyle with palpation. Pain with resisted elbow extension and flexion. +Tinel's sign with percussion at ulnar groove  Assessment/Plan:     1. Functional deficits secondary to left clavicle, rib fxs, ptx and flail chest after fall  2. ?left ulnar nerve irritation at the elbow, mild medial epicondylitis---pt appears to be staying quite active and busy despite symptoms 3. Pain Management: overall  improved  -elavil 25mg  qhs for sleep/nerve pain -naproxen OTC 220mg  BID with meals -ice/compression left elbow -recommended use of elbow sleeve for protection and to keep elbow more extended 4. Follow up with me in 3 months. Thirty minutes of face to face patient care time were spent during this visit. All questions were encouraged and answered.

## 2013-11-09 ENCOUNTER — Encounter (HOSPITAL_COMMUNITY): Payer: Self-pay | Admitting: *Deleted

## 2013-11-09 ENCOUNTER — Emergency Department (HOSPITAL_COMMUNITY)
Admission: EM | Admit: 2013-11-09 | Discharge: 2013-11-10 | Disposition: A | Discharge status: Home / Self Care | Payer: Self-pay | Attending: Emergency Medicine | Admitting: Emergency Medicine

## 2013-11-09 ENCOUNTER — Emergency Department (HOSPITAL_COMMUNITY): Payer: Self-pay

## 2013-11-09 DIAGNOSIS — R0781 Pleurodynia: Secondary | ICD-10-CM

## 2013-11-09 DIAGNOSIS — Z23 Encounter for immunization: Secondary | ICD-10-CM | POA: Insufficient documentation

## 2013-11-09 DIAGNOSIS — T148XXA Other injury of unspecified body region, initial encounter: Secondary | ICD-10-CM

## 2013-11-09 DIAGNOSIS — Z79899 Other long term (current) drug therapy: Secondary | ICD-10-CM | POA: Insufficient documentation

## 2013-11-09 DIAGNOSIS — Y9389 Activity, other specified: Secondary | ICD-10-CM | POA: Insufficient documentation

## 2013-11-09 DIAGNOSIS — Z8709 Personal history of other diseases of the respiratory system: Secondary | ICD-10-CM | POA: Insufficient documentation

## 2013-11-09 DIAGNOSIS — Z87891 Personal history of nicotine dependence: Secondary | ICD-10-CM | POA: Insufficient documentation

## 2013-11-09 DIAGNOSIS — Y92410 Unspecified street and highway as the place of occurrence of the external cause: Secondary | ICD-10-CM | POA: Insufficient documentation

## 2013-11-09 DIAGNOSIS — Z791 Long term (current) use of non-steroidal anti-inflammatories (NSAID): Secondary | ICD-10-CM | POA: Insufficient documentation

## 2013-11-09 DIAGNOSIS — S20319A Abrasion of unspecified front wall of thorax, initial encounter: Secondary | ICD-10-CM | POA: Insufficient documentation

## 2013-11-09 DIAGNOSIS — T1490XA Injury, unspecified, initial encounter: Secondary | ICD-10-CM

## 2013-11-09 MED ORDER — TETANUS-DIPHTH-ACELL PERTUSSIS 5-2.5-18.5 LF-MCG/0.5 IM SUSP
0.5000 mL | Freq: Once | INTRAMUSCULAR | Status: AC
  Administered 2013-11-10: 0.5 mL via INTRAMUSCULAR
  Filled 2013-11-09: qty 0.5

## 2013-11-09 MED ORDER — ONDANSETRON 4 MG PO TBDP
8.0000 mg | ORAL_TABLET | Freq: Once | ORAL | Status: AC
  Administered 2013-11-09: 8 mg via ORAL
  Filled 2013-11-09: qty 2

## 2013-11-09 MED ORDER — OXYCODONE-ACETAMINOPHEN 5-325 MG PO TABS
1.0000 | ORAL_TABLET | Freq: Once | ORAL | Status: AC
  Administered 2013-11-09: 1 via ORAL
  Filled 2013-11-09: qty 1

## 2013-11-09 MED ORDER — KETOROLAC TROMETHAMINE 60 MG/2ML IM SOLN
60.0000 mg | Freq: Once | INTRAMUSCULAR | Status: AC
  Administered 2013-11-10: 60 mg via INTRAMUSCULAR
  Filled 2013-11-09: qty 2
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
## 2013-11-10 MED ORDER — HYDROCODONE-ACETAMINOPHEN 5-325 MG PO TABS: 1.0000 | ORAL_TABLET | ORAL | Status: DC | PRN

## 2013-11-10 MED ORDER — IBUPROFEN 800 MG PO TABS: 800.0000 mg | ORAL_TABLET | Freq: Three times a day (TID) | ORAL | Status: DC | PRN

## 2013-11-10 NOTE — Discharge Instructions (Signed)
Read the information below.  Use the prescribed medication as directed.  Please discuss all new medications with your pharmacist.  Do not take additional tylenol while taking the prescribed pain medication to avoid overdose.  You may return to the Emergency Department at any time for worsening condition or any new symptoms that concern you.  If you develop worsening chest pain, shortness of breath, fever, you pass out, or become weak or dizzy, return to the ER for a recheck.      Rib Contusion A rib contusion (bruise) can occur by a blow to the chest or by a fall against a hard object. Usually these will be much better in a couple weeks. If X-rays were taken today and there are no broken bones (fractures), the diagnosis of bruising is made. However, broken ribs may not show up for several days, or may be discovered later on a routine X-ray when signs of healing show up. If this happens to you, it does not mean that something was missed on the X-ray, but simply that it did not show up on the first X-rays. Earlier diagnosis will not usually change the treatment. HOME CARE INSTRUCTIONS   Avoid strenuous activity. Be careful during activities and avoid bumping the injured ribs. Activities that pull on the injured ribs and cause pain should be avoided, if possible.  For the first day or two, an ice pack used every 20 minutes while awake may be helpful. Put ice in a plastic bag and put a towel between the bag and the skin.  Eat a normal, well-balanced diet. Drink plenty of fluids to avoid constipation.  Take deep breaths several times a day to keep lungs free of infection. Try to cough several times a day. Splint the injured area with a pillow while coughing to ease pain. Coughing can help prevent pneumonia.  Wear a rib belt or binder only if told to do so by your caregiver. If you are wearing a rib belt or binder, you must do the breathing exercises as directed by your caregiver. If not used properly, rib  belts or binders restrict breathing which can lead to pneumonia.  Only take over-the-counter or prescription medicines for pain, discomfort, or fever as directed by your caregiver. SEEK MEDICAL CARE IF:   You or your child has an oral temperature above 102 F (38.9 C).  Your baby is older than 3 months with a rectal temperature of 100.5 F (38.1 C) or higher for more than 1 day.  You develop a cough, with thick or bloody sputum. SEEK IMMEDIATE MEDICAL CARE IF:   You have difficulty breathing.  You feel sick to your stomach (nausea), have vomiting or belly (abdominal) pain.  You have worsening pain, not controlled with medications, or there is a change in the location of the pain.  You develop sweating or radiation of the pain into the arms, jaw or shoulders, or become light headed or faint.  You or your child has an oral temperature above 102 F (38.9 C), not controlled by medicine.  Your or your baby is older than 3 months with a rectal temperature of 102 F (38.9 C) or higher.  Your baby is 38 months old or younger with a rectal temperature of 100.4 F (38 C) or higher. MAKE SURE YOU:   Understand these instructions.  Will watch your condition.  Will get help right away if you are not doing well or get worse. Document Released: 09/15/2000 Document Revised: 04/17/2012 Document Reviewed:  08/09/2007 ExitCare Patient Information 2015 [place], [place]. This information is not intended to replace advice given to you by your health care provider. Make sure you discuss any questions you have with your health care provider.

## 2013-12-11 ENCOUNTER — Encounter: Payer: Self-pay | Attending: Physical Medicine & Rehabilitation | Admitting: Physical Medicine & Rehabilitation

## 2015-09-23 ENCOUNTER — Ambulatory Visit (INDEPENDENT_AMBULATORY_CARE_PROVIDER_SITE_OTHER): Payer: Self-pay

## 2015-09-23 ENCOUNTER — Encounter (HOSPITAL_COMMUNITY): Payer: Self-pay

## 2015-09-23 ENCOUNTER — Ambulatory Visit (HOSPITAL_COMMUNITY)
Admission: EM | Admit: 2015-09-23 | Discharge: 2015-09-23 | Disposition: A | Discharge status: Home / Self Care | Payer: Self-pay | Attending: Emergency Medicine | Admitting: Emergency Medicine

## 2015-09-23 DIAGNOSIS — S61219A Laceration without foreign body of unspecified finger without damage to nail, initial encounter: Secondary | ICD-10-CM

## 2015-09-23 DIAGNOSIS — S67195A Crushing injury of left ring finger, initial encounter: Secondary | ICD-10-CM

## 2015-09-23 DIAGNOSIS — Z23 Encounter for immunization: Secondary | ICD-10-CM

## 2015-09-23 MED ORDER — HYDROCODONE-ACETAMINOPHEN 5-325 MG PO TABS: 2.0000 | ORAL_TABLET | ORAL | 0 refills | Status: DC | PRN

## 2015-09-23 MED ORDER — HYDROCODONE-ACETAMINOPHEN 5-325 MG PO TABS
2.0000 | ORAL_TABLET | Freq: Once | ORAL | Status: AC
  Administered 2015-09-23: 2 via ORAL

## 2015-09-23 MED ORDER — TETANUS-DIPHTH-ACELL PERTUSSIS 5-2.5-18.5 LF-MCG/0.5 IM SUSP
INTRAMUSCULAR | Status: AC
  Filled 2015-09-23: qty 0.5

## 2015-09-23 MED ORDER — LIDOCAINE HCL (PF) 1 % IJ SOLN
10.0000 mL | Freq: Once | INTRAMUSCULAR | Status: AC
  Administered 2015-09-23: 10 mL

## 2015-09-23 MED ORDER — SULFAMETHOXAZOLE-TRIMETHOPRIM 800-160 MG PO TABS: 1.0000 | ORAL_TABLET | Freq: Two times a day (BID) | ORAL | 0 refills | Status: DC

## 2015-09-23 MED ORDER — LIDOCAINE HCL (PF) 1 % IJ SOLN
INTRAMUSCULAR | Status: AC
  Filled 2015-09-23: qty 30

## 2015-09-23 MED ORDER — HYDROCODONE-ACETAMINOPHEN 5-325 MG PO TABS
ORAL_TABLET | ORAL | Status: AC
  Filled 2015-09-23: qty 2

## 2015-09-23 MED ORDER — TETANUS-DIPHTH-ACELL PERTUSSIS 5-2.5-18.5 LF-MCG/0.5 IM SUSP
0.5000 mL | Freq: Once | INTRAMUSCULAR | Status: AC
  Administered 2015-09-23: 0.5 mL via INTRAMUSCULAR

## 2015-09-23 NOTE — Discharge Instructions (Signed)
Return in 2 days for a wound recheck.  Suture removal in 8 days

## 2015-09-23 NOTE — ED Notes (Signed)
Finger splint placed and dressed with coban. Patient tolerated well.
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
## 2015-09-25 ENCOUNTER — Ambulatory Visit (HOSPITAL_COMMUNITY)
Admission: EM | Admit: 2015-09-25 | Discharge: 2015-09-25 | Disposition: A | Discharge status: Home / Self Care | Payer: Self-pay | Attending: Internal Medicine | Admitting: Internal Medicine

## 2015-09-25 ENCOUNTER — Encounter (HOSPITAL_COMMUNITY): Payer: Self-pay | Admitting: Emergency Medicine

## 2015-09-25 DIAGNOSIS — Z87891 Personal history of nicotine dependence: Secondary | ICD-10-CM | POA: Insufficient documentation

## 2015-09-25 DIAGNOSIS — S61215D Laceration without foreign body of left ring finger without damage to nail, subsequent encounter: Secondary | ICD-10-CM | POA: Insufficient documentation

## 2015-09-25 DIAGNOSIS — S61219D Laceration without foreign body of unspecified finger without damage to nail, subsequent encounter: Secondary | ICD-10-CM

## 2015-09-25 DIAGNOSIS — L089 Local infection of the skin and subcutaneous tissue, unspecified: Secondary | ICD-10-CM

## 2015-09-25 DIAGNOSIS — Z79899 Other long term (current) drug therapy: Secondary | ICD-10-CM | POA: Insufficient documentation

## 2015-09-25 DIAGNOSIS — Z9889 Other specified postprocedural states: Secondary | ICD-10-CM | POA: Insufficient documentation

## 2015-09-25 DIAGNOSIS — Z09 Encounter for follow-up examination after completed treatment for conditions other than malignant neoplasm: Secondary | ICD-10-CM | POA: Insufficient documentation

## 2015-09-25 DIAGNOSIS — Z5189 Encounter for other specified aftercare: Secondary | ICD-10-CM

## 2015-09-25 MED ORDER — LIDOCAINE HCL (PF) 1 % IJ SOLN
INTRAMUSCULAR | Status: AC
  Filled 2015-09-25: qty 2

## 2015-09-25 MED ORDER — CEFTRIAXONE SODIUM 1 G IJ SOLR
1.0000 g | Freq: Once | INTRAMUSCULAR | Status: AC
  Administered 2015-09-25: 1 g via INTRAMUSCULAR

## 2015-09-25 MED ORDER — CEFTRIAXONE SODIUM 1 G IJ SOLR
INTRAMUSCULAR | Status: AC
  Filled 2015-09-25: qty 10

## 2015-09-25 MED ORDER — CLINDAMYCIN HCL 300 MG PO CAPS: 300.0000 mg | ORAL_CAPSULE | Freq: Three times a day (TID) | ORAL | 0 refills | Status: DC

## 2015-09-25 NOTE — ED Triage Notes (Signed)
Here for a f/u for lac to left 4th digit  Voices no new concerns.... A&O x4... NAD

## 2015-09-25 NOTE — ED Provider Notes (Signed)
CSN: [id number]     Arrival date & time 09/25/15  1244 History   None    Chief Complaint  Patient presents with  . Follow-up   (Consider location/radiation/quality/duration/timing/severity/associated sxs/prior Treatment) Patient is here for postop follow-up after suturing the laceration of the left distal ring finger. He states there is still pain associated with the laceration. He has been applying Neosporin ointment and cleaning with hydrogen peroxide. He states he has been compliant with the Septra and Norco however he states the medicine makes him feel dried out and weak.      Past Medical History:  Diagnosis Date  . Pneumothorax    Past Surgical History:  Procedure Laterality Date  . VIDEO ASSISTED THORACOSCOPY (VATS)/THOROCOTOMY Left 06/11/2013   Procedure: VIDEO ASSISTED THORACOSCOPY (VATS)/THOROCOTOMY;  Surgeon: Ivin Poot, MD;  Location: [place];  Service: Thoracic;  Laterality: Left;  [name] VIDEO BRONCHOSCOPY N/A 06/11/2013   Procedure: VIDEO BRONCHOSCOPY;  Surgeon: Ivin Poot, MD;  Location: [facility] OR;  Service: Thoracic;  Laterality: N/A;   Family History  Problem Relation Age of Onset  . Heart disease Father    Social History  Substance Use Topics  . Smoking status: Former Smoker    Types: Cigarettes  . Smokeless tobacco: Never Used  . Alcohol use Yes    Review of Systems  Constitutional: Negative.   Musculoskeletal:       Complains of pain to the sutured finger.  Neurological: Negative.   All other systems reviewed and are negative.   Allergies  Review of patient's allergies indicates no known allergies.  Home Medications   Prior to Admission medications   Medication Sig Start Date End Date Taking? Authorizing Provider  HYDROcodone-acetaminophen (NORCO/VICODIN) 5-325 MG tablet Take 2 tablets by mouth every 4 (four) hours as needed. 09/23/15  Yes Hollace Kinnier Sofia, PA-C  amitriptyline (ELAVIL) 25 MG tablet Take 1 tablet (25 mg total) by mouth at bedtime.  09/11/13   [name], MD  folic acid (FOLVITE) 1 MG tablet Take 1 tablet (1 mg total) by mouth daily. 06/28/13   Lavon Paganini Angiulli, PA-C  ibuprofen (ADVIL,MOTRIN) 800 MG tablet Take 1 tablet (800 mg total) by mouth every 8 (eight) hours as needed for mild pain or moderate pain. 11/10/13   Clayton Bibles, PA-C  iron polysaccharides (NIFEREX) 150 MG capsule Take 1 capsule (150 mg total) by mouth 2 (two) times daily before lunch and supper. 06/28/13   Lavon Paganini Angiulli, PA-C  methocarbamol (ROBAXIN) 500 MG tablet Take 1 tablet (500 mg total) by mouth every 6 (six) hours as needed for muscle spasms. 06/28/13   Lavon Paganini Angiulli, PA-C  tamsulosin (FLOMAX) 0.4 MG CAPS capsule Take 1 capsule (0.4 mg total) by mouth daily after supper. 06/28/13   Lavon Paganini Angiulli, PA-C   Meds Ordered and Administered this Visit   Medications  cefTRIAXone (ROCEPHIN) injection 1 g (not administered)    BP 128/91 (BP Location: Right Arm)   Pulse 61   Temp 98.6 F (37 C) (Oral)   Resp 12   SpO2 100%  No data found.   Physical Exam  Constitutional: He is oriented to person, place, and time. He appears well-developed and well-nourished. No distress.  Eyes: EOM are normal.  Neck: Normal range of motion. Neck supple.  Cardiovascular: Normal rate.   Pulmonary/Chest: Effort normal. No respiratory distress.  Musculoskeletal: He exhibits no edema.  The distal half of the laceration appears white and macerated. This is the appearance of  a wound that has remained wet most of the time. The sutures in that area or a little loose but remain intact. When light pressure is applied to the soft tissues adjacent to the edge of the nail a small amount of white pus is expressed from the edges of the nail but not the laceration. There is swelling and tenderness along the length of the finger. No erythema. No erythema proximally or of the hand. Flexion of the DIP is minimal due to pain. A little more flexion allow to the PIP.   Neurological: He is alert and oriented to person, place, and time. He exhibits normal muscle tone.  Skin: Skin is warm and dry.  Psychiatric: He has a normal mood and affect.  Nursing note and vitals reviewed.   Urgent Care Course   Clinical Course    Procedures (including critical care time)  Labs Review Labs Reviewed - No data to display  Imaging Review Dg Finger Ring Left  Result Date: 09/23/2015 CLINICAL DATA:  Crush injury with hammer today.  Distal laceration. EXAM: LEFT RING FINGER 2+V COMPARISON:  None. FINDINGS: Soft tissue deformity of the distal ring finger. No fracture or dislocation. No radiopaque foreign object. IMPRESSION: Soft tissue injury.  No osseous or articular finding. Electronically Signed   By: Nelson Chimes M.D.   On: 09/23/2015 17:08     Visual Acuity Review  Right Eye Distance:   Left Eye Distance:   Bilateral Distance:    Right Eye Near:   Left Eye Near:    Bilateral Near:         MDM   1. Visit for wound check   2. Laceration of finger with infection, subsequent encounter    Your antibiotic is going to be changed to clindamycin. Take as directed on the bottle. Stop taking the Septra. Clean the wound on your finger with warm salt water once or twice a day. Be sure to keep it dry. Then apply a dry dressing. Do not apply any more ointments. You have an infection in your finger. Be sure you take your antibiotics as directed. Recheck the wound here in the urgent care in 2 or 3 days, sooner if it looks like is getting worse or you are having more pain or drainage of pus. Do not delay in seeking medical treatment if it is worse. Meds ordered this encounter  Medications  . cefTRIAXone (ROCEPHIN) injection 1 g  wound culture sent. Small amt obtained.      Janne Napoleon, NP 09/25/15 1343

## 2015-09-25 NOTE — Discharge Instructions (Signed)
Your antibiotic is going to be changed to clindamycin. Take as directed on the bottle. Stop taking the Septra. Clean the wound on your finger with warm salt water once or twice a day. Be sure to keep it dry. Then apply a dry dressing. Do not apply any more appointments. You have an infection in your finger. Be sure you take your antibiotics as directed. Recheck the wound here in the urgent care in 2 or 3 days, sooner if it looks like is getting worse or you are having more pain or drainage of pus. Do not delay in seeking medical treatment if it is worse.

## 2015-09-28 ENCOUNTER — Encounter (HOSPITAL_COMMUNITY): Payer: Self-pay | Admitting: Emergency Medicine

## 2015-09-28 ENCOUNTER — Ambulatory Visit (HOSPITAL_COMMUNITY)
Admission: EM | Admit: 2015-09-28 | Discharge: 2015-09-28 | Disposition: A | Discharge status: Home / Self Care | Payer: Self-pay | Attending: Internal Medicine | Admitting: Internal Medicine

## 2015-09-28 DIAGNOSIS — Z5189 Encounter for other specified aftercare: Secondary | ICD-10-CM

## 2015-09-28 DIAGNOSIS — T148XXA Other injury of unspecified body region, initial encounter: Secondary | ICD-10-CM

## 2015-09-28 DIAGNOSIS — L089 Local infection of the skin and subcutaneous tissue, unspecified: Secondary | ICD-10-CM

## 2015-09-28 NOTE — Discharge Instructions (Signed)
Continue treating your fingerlike you have been, use the warm salt water soaks 2-3 times a day. Keep her finger dry other times. Maintaining a dressing to protect the wound. If there is any worsening such as swelling, redness, white pus, increased pain then returned to the urgent care promptly. Otherwise, return on Friday to have the sutures removed. Keep taking your antibiotic.

## 2015-09-28 NOTE — ED Provider Notes (Signed)
CSN: [id number]     Arrival date & time 09/28/15  1234 History   First MD Initiated Contact with Patient 09/28/15 1415     Chief Complaint  Patient presents with  . Wound Check   (Consider location/radiation/quality/duration/timing/severity/associated sxs/prior Treatment) 55 year old male was seen in the urgent care 3 days ago for follow-up of injury to the left ring finger. It had been injured a couple days previously and received a longitudinal laceration to the distal aspect. It was considered a dirty wound and the patient was asked to follow-up at that time. At the last visit there appeared to be infection within the wound, purulence exuding from the area surrounding the nail and some maceration to the distal aspect of the laceration. The Septra antibiotic that had been prescribed was discontinued and changed to clindamycin. The patient also received an injection of Rocephin IM. A culture was obtained. The culture revealed a combination of pathogenic organisms of which were resistant to sulfonamide. Clindamycin was nonspecifically dropped however other antibiotics were shown to be sensitive to the organisms. Since the finger is looking better we will continue that antibiotic.      Past Medical History:  Diagnosis Date  . Pneumothorax    Past Surgical History:  Procedure Laterality Date  . VIDEO ASSISTED THORACOSCOPY (VATS)/THOROCOTOMY Left 06/11/2013   Procedure: VIDEO ASSISTED THORACOSCOPY (VATS)/THOROCOTOMY;  Surgeon: Ivin Poot, MD;  Location: [place];  Service: Thoracic;  Laterality: Left;  [name] VIDEO BRONCHOSCOPY N/A 06/11/2013   Procedure: VIDEO BRONCHOSCOPY;  Surgeon: Ivin Poot, MD;  Location: [facility];  Service: Thoracic;  Laterality: N/A;   Family History  Problem Relation Age of Onset  . Heart disease Father    Social History  Substance Use Topics  . Smoking status: Former Smoker    Types: Cigarettes  . Smokeless tobacco: Never Used  . Alcohol use Yes    Review of  Systems  Constitutional: Negative.  Negative for activity change, chills, fatigue and fever.  HENT: Negative.   Skin:       The finger has less swelling. No erythema. No purulence is expressed. There is a single drop of clear honey-colored serous material from the distal aspect of the sutured wound. Appears to be healing quite well.  All other systems reviewed and are negative.   Allergies  Review of patient's allergies indicates no known allergies.  Home Medications   Prior to Admission medications   Medication Sig Start Date End Date Taking? Authorizing Provider  amitriptyline (ELAVIL) 25 MG tablet Take 1 tablet (25 mg total) by mouth at bedtime. 09/11/13   [name], MD  clindamycin (CLEOCIN) 300 MG capsule Take 1 capsule (300 mg total) by mouth 3 (three) times daily. 09/25/15   Janne Napoleon, NP  folic acid (FOLVITE) 1 MG tablet Take 1 tablet (1 mg total) by mouth daily. 06/28/13   Lavon Paganini Angiulli, PA-C  HYDROcodone-acetaminophen (NORCO/VICODIN) 5-325 MG tablet Take 2 tablets by mouth every 4 (four) hours as needed. 09/23/15   Fransico Meadow, PA-C  ibuprofen (ADVIL,MOTRIN) 800 MG tablet Take 1 tablet (800 mg total) by mouth every 8 (eight) hours as needed for mild pain or moderate pain. 11/10/13   Clayton Bibles, PA-C  iron polysaccharides (NIFEREX) 150 MG capsule Take 1 capsule (150 mg total) by mouth 2 (two) times daily before lunch and supper. 06/28/13   Lavon Paganini Angiulli, PA-C  methocarbamol (ROBAXIN) 500 MG tablet Take 1 tablet (500 mg total) by mouth every 6 (six)  hours as needed for muscle spasms. 06/28/13   Lavon Paganini Angiulli, PA-C  tamsulosin (FLOMAX) 0.4 MG CAPS capsule Take 1 capsule (0.4 mg total) by mouth daily after supper. 06/28/13   Lavon Paganini Angiulli, PA-C   Meds Ordered and Administered this Visit  Medications - No data to display  BP 157/95 (BP Location: Right Arm)   Pulse 71   Temp 98.1 F (36.7 C) (Oral)   Resp 14   SpO2 97%  No data found.   Physical  Exam  Urgent Care Course   Clinical Course    Procedures (including critical care time)  Labs Review Labs Reviewed - No data to display  Imaging Review No results found.   Visual Acuity Review  Right Eye Distance:   Left Eye Distance:   Bilateral Distance:    Right Eye Near:   Left Eye Near:    Bilateral Near:         MDM   1. Visit for wound check   2. Wound infection ([place])    Apply dry sterile dressing. Finger is looking much better. No purulence is expressed manually. There is a thin clear honey-colored serous fluid at the tip of the wound now rather than pus. The finger is smaller, less swelling no erythema. The culture is growing out a complex of organisms that were not susceptible to Septra. It does not appear the clindamycin was dropped for testing however the finger does appear to be improving and signs of infection are resolving. Continue treating your fingerlike you have been, use the warm salt water soaks 2-3 times a day. Keep her finger dry other times. Maintaining a dressing to protect the wound. If there is any worsening such as swelling, redness, white pus, increased pain then returned to the urgent care promptly. Otherwise, return on Friday to have the sutures removed. Keep taking your antibiotic.       Janne Napoleon, NP 09/28/15 [place], NP 09/28/15 [phone]

## 2015-09-28 NOTE — ED Triage Notes (Signed)
PT has sutures in second finger. PT was told Friday to have area rechecked Saturday or Sunday.

## 2015-09-30 LAB — AEROBIC CULTURE W GRAM STAIN (SUPERFICIAL SPECIMEN): Gram Stain: NONE SEEN

## 2015-09-30 LAB — AEROBIC CULTURE  (SUPERFICIAL SPECIMEN)

## 2015-10-03 ENCOUNTER — Encounter (HOSPITAL_COMMUNITY): Payer: Self-pay | Admitting: *Deleted

## 2015-10-03 ENCOUNTER — Ambulatory Visit (HOSPITAL_COMMUNITY)
Admission: EM | Admit: 2015-10-03 | Discharge: 2015-10-03 | Disposition: A | Discharge status: Home / Self Care | Payer: Self-pay | Attending: Family Medicine | Admitting: Family Medicine

## 2015-10-03 DIAGNOSIS — Z4802 Encounter for removal of sutures: Secondary | ICD-10-CM

## 2015-10-03 NOTE — ED Triage Notes (Signed)
Pt here   For  Possible   Suture removal         Hand

## 2015-10-03 NOTE — ED Provider Notes (Signed)
[facility]    CSN: [id number] Arrival date & time: 10/03/15  1539     History   Chief Complaint Chief Complaint  Patient presents with  . Suture / Staple Removal    HPI OCTAVIOUS ZIDEK is a 55 y.o. male.   The history is provided by the patient.  Suture / Staple Removal  This is a new problem. The current episode started more than 2 days ago. The problem has been gradually improving. Associated symptoms comments: Pt with finger lac repair on 9/23 and 2 f/u visits since with good healing, .    Past Medical History:  Diagnosis Date  . Pneumothorax     Patient Active Problem List   Diagnosis Date Noted  . Impingement of left ulnar nerve 09/11/2013  . Acute blood loss anemia 07/03/2013  . Clavicle fracture 07/03/2013  . Flail chest 07/03/2013  . Multiple trauma 07/03/2013  . Trauma of chest 06/22/2013  . Hemothorax on left 06/11/2013  . Acute respiratory failure ([place]) 06/08/2013  . Altered mental status 06/08/2013  . Pneumonia, organism unspecified 06/08/2013  . Fall from ladder 06/05/2013    Past Surgical History:  Procedure Laterality Date  . VIDEO ASSISTED THORACOSCOPY (VATS)/THOROCOTOMY Left 06/11/2013   Procedure: VIDEO ASSISTED THORACOSCOPY (VATS)/THOROCOTOMY;  Surgeon: Ivin Poot, MD;  Location: [place];  Service: Thoracic;  Laterality: Left;  [name] VIDEO BRONCHOSCOPY N/A 06/11/2013   Procedure: VIDEO BRONCHOSCOPY;  Surgeon: Ivin Poot, MD;  Location: [facility] OR;  Service: Thoracic;  Laterality: N/A;       Home Medications    Prior to Admission medications   Medication Sig Start Date End Date Taking? Authorizing Provider  amitriptyline (ELAVIL) 25 MG tablet Take 1 tablet (25 mg total) by mouth at bedtime. 09/11/13   [name], MD  clindamycin (CLEOCIN) 300 MG capsule Take 1 capsule (300 mg total) by mouth 3 (three) times daily. 09/25/15   Janne Napoleon, NP  folic acid (FOLVITE) 1 MG tablet Take 1 tablet (1 mg total) by mouth daily. 06/28/13    Lavon Paganini Angiulli, PA-C  HYDROcodone-acetaminophen (NORCO/VICODIN) 5-325 MG tablet Take 2 tablets by mouth every 4 (four) hours as needed. 09/23/15   Fransico Meadow, PA-C  ibuprofen (ADVIL,MOTRIN) 800 MG tablet Take 1 tablet (800 mg total) by mouth every 8 (eight) hours as needed for mild pain or moderate pain. 11/10/13   Clayton Bibles, PA-C  iron polysaccharides (NIFEREX) 150 MG capsule Take 1 capsule (150 mg total) by mouth 2 (two) times daily before lunch and supper. 06/28/13   Lavon Paganini Angiulli, PA-C  methocarbamol (ROBAXIN) 500 MG tablet Take 1 tablet (500 mg total) by mouth every 6 (six) hours as needed for muscle spasms. 06/28/13   Lavon Paganini Angiulli, PA-C  tamsulosin (FLOMAX) 0.4 MG CAPS capsule Take 1 capsule (0.4 mg total) by mouth daily after supper. 06/28/13   Lavon Paganini Angiulli, PA-C    Family History Family History  Problem Relation Age of Onset  . Heart disease Father     Social History Social History  Substance Use Topics  . Smoking status: Former Smoker    Types: Cigarettes  . Smokeless tobacco: Never Used  . Alcohol use Yes     Allergies   Review of patient's allergies indicates no known allergies.   Review of Systems Review of Systems  Skin: Positive for wound.  All other systems reviewed and are negative.    Physical Exam Triage Vital Signs ED Triage Vitals  Enc Vitals Group     BP 10/03/15 1658 164/89     Pulse Rate 10/03/15 1658 74     Resp --      Temp 10/03/15 1658 98.6 F (37 C)     Temp Source 10/03/15 1658 Oral     SpO2 10/03/15 1658 98 %     Weight --      Height --      Head Circumference --      Peak Flow --      Pain Score 10/03/15 1703 3     Pain Loc --      Pain Edu? --      Excl. in [place]? --    No data found.   Updated Vital Signs BP 164/89 (BP Location: Left Arm)   Pulse 74   Temp 98.6 F (37 C) (Oral)   SpO2 98%   Visual Acuity Right Eye Distance:   Left Eye Distance:   Bilateral Distance:    Right Eye Near:   Left Eye  Near:    Bilateral Near:     Physical Exam  Constitutional: He appears well-developed and well-nourished. No distress.  Skin: Skin is warm and dry.  Sutures removed, no apparent infection. Soaked and redressed.  Nursing note and vitals reviewed.    UC Treatments / Results  Labs (all labs ordered are listed, but only abnormal results are displayed) Labs Reviewed - No data to display  EKG  EKG Interpretation None       Radiology No results found.  Procedures Procedures (including critical care time)  Medications Ordered in UC Medications - No data to display   Initial Impression / Assessment and Plan / UC Course  I have reviewed the triage vital signs and the nursing notes.  Pertinent labs & imaging results that were available during my care of the patient were reviewed by me and considered in my medical decision making (see chart for details).  Clinical Course      Final Clinical Impressions(s) / UC Diagnoses   Final diagnoses:  None    New Prescriptions New Prescriptions   No medications on file     Billy Fischer, MD 10/03/15 1725

## 2016-03-26 ENCOUNTER — Emergency Department (HOSPITAL_COMMUNITY): Payer: Self-pay

## 2016-03-26 ENCOUNTER — Inpatient Hospital Stay (HOSPITAL_COMMUNITY)
Admission: EM | Admit: 2016-03-26 | Discharge: 2016-03-28 | DRG: 190 | Disposition: A | Discharge status: Home / Self Care | Payer: Self-pay | Attending: Internal Medicine | Admitting: Internal Medicine

## 2016-03-26 ENCOUNTER — Encounter (HOSPITAL_COMMUNITY): Payer: Self-pay | Admitting: Emergency Medicine

## 2016-03-26 DIAGNOSIS — M79662 Pain in left lower leg: Secondary | ICD-10-CM | POA: Diagnosis present

## 2016-03-26 DIAGNOSIS — F1290 Cannabis use, unspecified, uncomplicated: Secondary | ICD-10-CM | POA: Diagnosis present

## 2016-03-26 DIAGNOSIS — F1020 Alcohol dependence, uncomplicated: Secondary | ICD-10-CM | POA: Diagnosis present

## 2016-03-26 DIAGNOSIS — J189 Pneumonia, unspecified organism: Secondary | ICD-10-CM

## 2016-03-26 DIAGNOSIS — F1490 Cocaine use, unspecified, uncomplicated: Secondary | ICD-10-CM | POA: Diagnosis present

## 2016-03-26 DIAGNOSIS — Z8249 Family history of ischemic heart disease and other diseases of the circulatory system: Secondary | ICD-10-CM

## 2016-03-26 DIAGNOSIS — J441 Chronic obstructive pulmonary disease with (acute) exacerbation: Principal | ICD-10-CM | POA: Diagnosis present

## 2016-03-26 DIAGNOSIS — R739 Hyperglycemia, unspecified: Secondary | ICD-10-CM

## 2016-03-26 DIAGNOSIS — R0602 Shortness of breath: Secondary | ICD-10-CM | POA: Insufficient documentation

## 2016-03-26 DIAGNOSIS — T380X5A Adverse effect of glucocorticoids and synthetic analogues, initial encounter: Secondary | ICD-10-CM | POA: Diagnosis not present

## 2016-03-26 DIAGNOSIS — J984 Other disorders of lung: Secondary | ICD-10-CM

## 2016-03-26 DIAGNOSIS — R945 Abnormal results of liver function studies: Secondary | ICD-10-CM | POA: Diagnosis present

## 2016-03-26 DIAGNOSIS — J9601 Acute respiratory failure with hypoxia: Secondary | ICD-10-CM

## 2016-03-26 DIAGNOSIS — Z79899 Other long term (current) drug therapy: Secondary | ICD-10-CM

## 2016-03-26 DIAGNOSIS — J449 Chronic obstructive pulmonary disease, unspecified: Secondary | ICD-10-CM | POA: Diagnosis present

## 2016-03-26 DIAGNOSIS — D72829 Elevated white blood cell count, unspecified: Secondary | ICD-10-CM

## 2016-03-26 DIAGNOSIS — M954 Acquired deformity of chest and rib: Secondary | ICD-10-CM | POA: Diagnosis present

## 2016-03-26 DIAGNOSIS — F17220 Nicotine dependence, chewing tobacco, uncomplicated: Secondary | ICD-10-CM | POA: Diagnosis present

## 2016-03-26 DIAGNOSIS — J9691 Respiratory failure, unspecified with hypoxia: Secondary | ICD-10-CM | POA: Diagnosis present

## 2016-03-26 DIAGNOSIS — Z825 Family history of asthma and other chronic lower respiratory diseases: Secondary | ICD-10-CM

## 2016-03-26 DIAGNOSIS — Z8709 Personal history of other diseases of the respiratory system: Secondary | ICD-10-CM

## 2016-03-26 DIAGNOSIS — J181 Lobar pneumonia, unspecified organism: Secondary | ICD-10-CM

## 2016-03-26 LAB — CBC
HCT: 47.6 % (ref 39.0–52.0)
HEMOGLOBIN: 16.2 g/dL (ref 13.0–17.0)
MCH: 32.8 pg (ref 26.0–34.0)
MCHC: 34 g/dL (ref 30.0–36.0)
MCV: 96.4 fL (ref 78.0–100.0)
Platelets: 200 10*3/uL (ref 150–400)
RBC: 4.94 MIL/uL (ref 4.22–5.81)
RDW: 12.8 % (ref 11.5–15.5)
WBC: 13.8 10*3/uL — ABNORMAL HIGH (ref 4.0–10.5)

## 2016-03-26 LAB — BASIC METABOLIC PANEL
Anion gap: 9 (ref 5–15)
BUN: 9 mg/dL (ref 6–20)
CHLORIDE: 101 mmol/L (ref 101–111)
CO2: 29 mmol/L (ref 22–32)
Calcium: 9.5 mg/dL (ref 8.9–10.3)
Creatinine, Ser: 0.78 mg/dL (ref 0.61–1.24)
GFR calc Af Amer: >60 mL/min (ref >60)
GFR calc non Af Amer: >60 mL/min (ref >60)
GLUCOSE: 139 mg/dL — AB (ref 65–99)
POTASSIUM: 4.9 mmol/L (ref 3.5–5.1)
Sodium: 139 mmol/L (ref 135–145)

## 2016-03-26 LAB — HEPATIC FUNCTION PANEL
ALBUMIN: 3.6 g/dL (ref 3.5–5.0)
ALK PHOS: 40 U/L (ref 38–126)
ALT: 79 U/L — ABNORMAL HIGH (ref 17–63)
AST: 48 U/L — ABNORMAL HIGH (ref 15–41)
Bilirubin, Direct: <0.1 mg/dL — ABNORMAL LOW (ref 0.1–0.5)
TOTAL PROTEIN: 6.5 g/dL (ref 6.5–8.1)
Total Bilirubin: 0.6 mg/dL (ref 0.3–1.2)

## 2016-03-26 LAB — BRAIN NATRIURETIC PEPTIDE: B Natriuretic Peptide: 23.4 pg/mL (ref 0.0–100.0)

## 2016-03-26 LAB — I-STAT TROPONIN, ED: Troponin i, poc: 0 ng/mL (ref 0.00–0.08)

## 2016-03-26 LAB — PROCALCITONIN

## 2016-03-26 MED ORDER — LORAZEPAM 2 MG/ML IJ SOLN: 1.0000 mg | Freq: Four times a day (QID) | INTRAMUSCULAR | Status: DC | PRN

## 2016-03-26 MED ORDER — THIAMINE HCL 100 MG/ML IJ SOLN
100.0000 mg | Freq: Every day | INTRAMUSCULAR | Status: DC
Start: 2016-03-26 — End: 2016-03-27
  Filled 2016-03-26: qty 2

## 2016-03-26 MED ORDER — SODIUM CHLORIDE 0.9 % IV SOLN
INTRAVENOUS | Status: AC
  Administered 2016-03-26 – 2016-03-27 (×3): via INTRAVENOUS

## 2016-03-26 MED ORDER — AZITHROMYCIN 500 MG PO TABS
250.0000 mg | ORAL_TABLET | Freq: Every day | ORAL | Status: DC
  Administered 2016-03-27 – 2016-03-28 (×2): 250 mg via ORAL
  Filled 2016-03-26 (×2): qty 1

## 2016-03-26 MED ORDER — IPRATROPIUM-ALBUTEROL 0.5-2.5 (3) MG/3ML IN SOLN
3.0000 mL | Freq: Once | RESPIRATORY_TRACT | Status: AC
  Administered 2016-03-26: 3 mL via RESPIRATORY_TRACT
  Filled 2016-03-26: qty 3

## 2016-03-26 MED ORDER — IPRATROPIUM-ALBUTEROL 0.5-2.5 (3) MG/3ML IN SOLN
3.0000 mL | Freq: Four times a day (QID) | RESPIRATORY_TRACT | Status: DC
  Administered 2016-03-26 – 2016-03-27 (×3): 3 mL via RESPIRATORY_TRACT
  Filled 2016-03-26 (×3): qty 3

## 2016-03-26 MED ORDER — IOPAMIDOL (ISOVUE-370) INJECTION 76%
INTRAVENOUS | Status: AC
  Administered 2016-03-26: 100 mL
  Filled 2016-03-26: qty 100

## 2016-03-26 MED ORDER — POLYETHYLENE GLYCOL 3350 17 G PO PACK: 17.0000 g | PACK | Freq: Every day | ORAL | Status: DC | PRN

## 2016-03-26 MED ORDER — VITAMIN B-1 100 MG PO TABS
100.0000 mg | ORAL_TABLET | Freq: Every day | ORAL | Status: DC
  Administered 2016-03-26 – 2016-03-28 (×3): 100 mg via ORAL
  Filled 2016-03-26 (×3): qty 1

## 2016-03-26 MED ORDER — IPRATROPIUM-ALBUTEROL 0.5-2.5 (3) MG/3ML IN SOLN: 3.0000 mL | RESPIRATORY_TRACT | Status: DC

## 2016-03-26 MED ORDER — ENOXAPARIN SODIUM 40 MG/0.4ML ~~LOC~~ SOLN
40.0000 mg | SUBCUTANEOUS | Status: DC
  Administered 2016-03-26 – 2016-03-27 (×2): 40 mg via SUBCUTANEOUS
  Filled 2016-03-26 (×2): qty 0.4

## 2016-03-26 MED ORDER — ACETAMINOPHEN 650 MG RE SUPP: 650.0000 mg | Freq: Four times a day (QID) | RECTAL | Status: DC | PRN

## 2016-03-26 MED ORDER — ALBUTEROL SULFATE (2.5 MG/3ML) 0.083% IN NEBU: 2.5000 mg | INHALATION_SOLUTION | RESPIRATORY_TRACT | Status: DC | PRN

## 2016-03-26 MED ORDER — PREDNISONE 20 MG PO TABS
40.0000 mg | ORAL_TABLET | Freq: Every day | ORAL | Status: DC
  Administered 2016-03-26 – 2016-03-28 (×3): 40 mg via ORAL
  Filled 2016-03-26 (×3): qty 2

## 2016-03-26 MED ORDER — AZITHROMYCIN 250 MG PO TABS
500.0000 mg | ORAL_TABLET | Freq: Every day | ORAL | Status: AC
  Administered 2016-03-26: 500 mg via ORAL
  Filled 2016-03-26: qty 2

## 2016-03-26 MED ORDER — LORAZEPAM 1 MG PO TABS
1.0000 mg | ORAL_TABLET | Freq: Four times a day (QID) | ORAL | Status: DC | PRN
  Administered 2016-03-28: 1 mg via ORAL
  Filled 2016-03-26 (×2): qty 1

## 2016-03-26 MED ORDER — ACETAMINOPHEN 325 MG PO TABS
650.0000 mg | ORAL_TABLET | Freq: Four times a day (QID) | ORAL | Status: DC | PRN
  Administered 2016-03-26 – 2016-03-27 (×2): 650 mg via ORAL
  Filled 2016-03-26 (×2): qty 2

## 2016-03-26 MED ORDER — ADULT MULTIVITAMIN W/MINERALS CH
1.0000 | ORAL_TABLET | Freq: Every day | ORAL | Status: DC
  Administered 2016-03-26 – 2016-03-28 (×3): 1 via ORAL
  Filled 2016-03-26 (×3): qty 1

## 2016-03-26 MED ORDER — FOLIC ACID 1 MG PO TABS
1.0000 mg | ORAL_TABLET | Freq: Every day | ORAL | Status: DC
  Administered 2016-03-26 – 2016-03-28 (×3): 1 mg via ORAL
  Filled 2016-03-26 (×3): qty 1

## 2016-03-26 NOTE — ED Notes (Signed)
Patient transported to CT 

## 2016-03-26 NOTE — ED Triage Notes (Signed)
Pt here from home with c/o sobb , pt smoked for years but has not for about 3 years, pt received 2 nebs  125 solumedrol and mag from ems

## 2016-03-26 NOTE — Progress Notes (Signed)
RT note: RT transported patient on BIPAP to CT and back to ED . Vital signs stable at this time.

## 2016-03-26 NOTE — ED Provider Notes (Signed)
[facility] DEPT Provider Note   CSN: [id number] Arrival date & time: 03/26/16  3235     History   Chief Complaint Chief Complaint  Patient presents with  . Shortness of Breath    HPI James [name] is a 56 y.o. male PMH anemia and traumatic injury leading to flail chest presents with history of tobacco abuse presents with sudden onset shortness of breath. He woke up this morning with a wheeze and worsening of his baseline SOB. Denies worsening of his daily productive cough. No increased sputum production. Denies chest pain, leg swelling, orthopnea, or myalgia. He has dyspnea on exertion at baseline, describes difficulty breathing with exertion- putting on his shoes, and chopping wood. Says that at baseline he could walk through an entire grocery store without stopping to rest. Smoked cigarettes in the past but quit smoking 2.5 years ago, does smoke marijuana on occasion.  He has had left knee cap and calf pain for the past month. Describes a very active lifestyle involving chopping wood and no recent travel. Denies known cancer dx.   This morning he called EMS, upon their arrival he had SpO2 80%. In route he was given albuterol nebs x2, magnesium, and IV solumedrol 125 mg. He reports only minor improvement in his symptoms after these medications.   HPI  Past Medical History:  Diagnosis Date  . Pneumothorax     Patient Active Problem List   Diagnosis Date Noted  . Impingement of left ulnar nerve 09/11/2013  . Acute blood loss anemia 07/03/2013  . Clavicle fracture 07/03/2013  . Flail chest 07/03/2013  . Multiple trauma 07/03/2013  . Trauma of chest 06/22/2013  . Hemothorax on left 06/11/2013  . Acute respiratory failure ([place]) 06/08/2013  . Altered mental status 06/08/2013  . Pneumonia, organism unspecified([place]) 06/08/2013  . Fall from ladder 06/05/2013    Past Surgical History:  Procedure Laterality Date  . VIDEO ASSISTED THORACOSCOPY (VATS)/THOROCOTOMY Left 06/11/2013     Procedure: VIDEO ASSISTED THORACOSCOPY (VATS)/THOROCOTOMY;  Surgeon: Ivin Poot, MD;  Location: [facility];  Service: Thoracic;  Laterality: Left;  [name] VIDEO BRONCHOSCOPY N/A 06/11/2013   Procedure: VIDEO BRONCHOSCOPY;  Surgeon: Ivin Poot, MD;  Location: [facility] OR;  Service: Thoracic;  Laterality: N/A;       Home Medications    Prior to Admission medications   Medication Sig Start Date End Date Taking? Authorizing Provider  amitriptyline (ELAVIL) 25 MG tablet Take 1 tablet (25 mg total) by mouth at bedtime. 09/11/13   [name], MD  clindamycin (CLEOCIN) 300 MG capsule Take 1 capsule (300 mg total) by mouth 3 (three) times daily. 09/25/15   Janne Napoleon, NP  folic acid (FOLVITE) 1 MG tablet Take 1 tablet (1 mg total) by mouth daily. 06/28/13   Lavon Paganini Angiulli, PA-C  HYDROcodone-acetaminophen (NORCO/VICODIN) 5-325 MG tablet Take 2 tablets by mouth every 4 (four) hours as needed. 09/23/15   Fransico Meadow, PA-C  ibuprofen (ADVIL,MOTRIN) 800 MG tablet Take 1 tablet (800 mg total) by mouth every 8 (eight) hours as needed for mild pain or moderate pain. 11/10/13   Clayton Bibles, PA-C  iron polysaccharides (NIFEREX) 150 MG capsule Take 1 capsule (150 mg total) by mouth 2 (two) times daily before lunch and supper. 06/28/13   Lavon Paganini Angiulli, PA-C  methocarbamol (ROBAXIN) 500 MG tablet Take 1 tablet (500 mg total) by mouth every 6 (six) hours as needed for muscle spasms. 06/28/13   Lavon Paganini Angiulli, PA-C  tamsulosin [facility])  0.4 MG CAPS capsule Take 1 capsule (0.4 mg total) by mouth daily after supper. 06/28/13   Lavon Paganini Angiulli, PA-C    Family History Family History  Problem Relation Age of Onset  . Heart disease Father     Social History Social History  Substance Use Topics  . Smoking status: Former Smoker    Types: Cigarettes  . Smokeless tobacco: Never Used  . Alcohol use Yes     Allergies   Patient has no known allergies.   Review of Systems Review of Systems  Respiratory:  Positive for cough, shortness of breath and wheezing.   Cardiovascular: Negative for chest pain and leg swelling.  Musculoskeletal: Negative for myalgias.  All other systems reviewed and are negative.   Physical Exam Updated Vital Signs BP (!) 176/115   Pulse (!) 114   Temp 99.7 F (37.6 C) (Oral)   Resp (!) 34   Ht 5' 9.5" (1.765 m)   SpO2 100% Comment: after neb and on 4 liters O2 n/c  Physical Exam  Constitutional: He is oriented to person, place, and time. He appears well-developed and well-nourished. He appears distressed.  HENT:  Head: Normocephalic and atraumatic.  Eyes: Conjunctivae are normal. No scleral icterus.  Neck: Normal range of motion.  Cardiovascular: Regular rhythm.  Tachycardia present.   No murmur heard. Left calf tenderness without swelling   Pulmonary/Chest: He is in respiratory distress. He has wheezes. He has no rales.  Pursed lips breathing and accessory muscle use. Diffuse decreased air entry and wheeze present. Breath sounds present in all lung fields  Abdominal: Soft. He exhibits no distension. There is no tenderness.  Neurological: He is alert and oriented to person, place, and time.  Skin: Skin is warm and dry. He is not diaphoretic.  Psychiatric: He has a normal mood and affect. His behavior is normal.   ED Treatments / Results  Labs (all labs ordered are listed, but only abnormal results are displayed) Labs Reviewed  CBC  BASIC METABOLIC PANEL    EKG  EKG Interpretation None       Radiology No results found.  Procedures Procedures (including critical care time)  Medications Ordered in ED Medications - No data to display   Initial Impression / Assessment and Plan / ED Course  I have reviewed the triage vital signs and the nursing notes.  Pertinent labs & imaging results that were available during my care of the patient were reviewed by me and considered in my medical decision making (see chart for details).   56 year old  man with no known history of cardiac or pulmonary disease but hx of tobacco use and dyspnea on exertion presents with sudden onset shortness of breath and one month of left knee pain. Has increased work of breathing on exam with pursed lips and accessory muscle use and diffuse wheezes. Wells score is high risk (signs of DVT, PE is likely, HR >100)   Portable chest xray showed stable hyperinflation and well healing right lower rib fractures, did not reveal pneumothorax.   11:45 CT angio was suspicious for atypical bronchopneumonia did not show signs of proximal PE. He will need admission for oxygen requirement and tx of pneumonia.    Final Clinical Impressions(s) / ED Diagnoses   Final diagnoses:  Shortness of breath    New Prescriptions New Prescriptions   No medications on file     Ledell Noss, MD 03/26/16 [place], DO 03/26/16 1319

## 2016-03-26 NOTE — ED Notes (Signed)
Report called  

## 2016-03-26 NOTE — Progress Notes (Signed)
Pt admitted to the unit at 1725. Pt mental status is A&O x 4. Pt oriented to room, staff, and call bell. Skin is intact except where otherwise charted. Full assessment charted in [facility]. Call bell within reach. Visitor guidelines reviewed w/ pt and/or family.

## 2016-03-26 NOTE — H&P (Signed)
Date: 03/26/2016               Patient Name:  James [name] MRN: [id number]  DOB: [birth year] Age / Sex: 56 y.o., male   PCP: No Pcp Per Patient         Medical Service: Internal Medicine Teaching Service         Attending Physician: Dr. Gilles Chiquito    First Contact: Dr. Asencion Partridge Pager: [phone]  Second Contact: Dr. Shela Leff Pager: [phone]       After Hours (After 5p/  First Contact Pager: [phone]  weekends / holidays): Second Contact Pager: [phone]   Chief Complaint: Shortness of breath  History of Present Illness: James [name] is a 56 y.o. gentleman with PMH tobacco abuse and traumatic pneumothorax (2015) who presents for sudden onset shortness of breath. He reports waking up from sleep at 4 AM with severe shortness of breath, increased wheezing, and fatigue. His symptoms persisted all morning so he called EMS to come to the ED. He denies any changes in his chronic cough which is typically productive of clear sputum. He reports feeling completely fine yesterday and capable of his usual activities such as chopping wood. He had a cold with runny nose and malaise last week that resolved. He reports worsened dyspnea on exertion and typically is able to ambulate around a grocery store without resting. He reports smoking 1-2 packs per day for 40 years and quit 3 years ago. 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 He reports some sharp intermittent left calf pain for the past month. He denies leg swelling, chest pain, reflux symptoms, fevers, chills, abdominal pain, orthopnea, or current URI symptoms. EMS found him with SpO2 80% and he received albuterol nebulizer, Mg, and Solumedrol PTA with minimal symptom improvement.  In the ED he presented afebrile, HR 114, RR 34, BP 176/115, with SpO2 in high 90s on 4L via  with increased work of breathing and wheezing on exam. Labs remarkable for WBC 13.8, BNP 23, troponin 0. CXR  revealed mild hyperinflation, EKG sinus tach. CTA chest revealed no PE but bilateral emphysematous changes, hyperinflation, and findings suspicious for bilateral atypical bronchopneumonia. IMTS was contacted for admission   Meds:  Takes no medications, OTC or supplements  Allergies: Allergies as of 03/26/2016  . (No Known Allergies)   Past Medical History:  Diagnosis Date  . Pneumothorax    2016, fell from ladder   Family History:  Family History  Problem Relation Age of Onset  . Heart disease Father   . COPD Sister     Social History:  Social History   Social History  . Marital status: Divorced    Spouse name: N/A  . Number of children: N/A  . Years of education: N/A   Occupational History  . Mechanic   . [employer]    Social History Main Topics  . Smoking status: Former Smoker    Packs/day: 1.50    Years: 40.00    Types: Cigarettes  . Smokeless tobacco: Current User    Types: Chew     Comment: 1 can daily for 3 years  . Alcohol use 1.8 oz/week    3 Cans of beer per week  . Drug use: Yes    Types: Marijuana     Comment: Remote cocaine use  . Sexual activity: Not on file   Other Topics Concern  . Not on file  Social History Narrative   Lives alone near [place]: A complete ROS was negative except as per HPI.   Physical Exam: Blood pressure (!) 162/77, pulse (!) 109, temperature 98.4 F (36.9 C), resp. rate 19, height 5' 9.5" (1.765 m), SpO2 92 %.  General appearance: Elderly gentleman sitting forward in bed, conversational but tachypneic, in mild distress, disheveled HENT: Normocephalic, atraumatic, moist mucous membranes, poor dentition Eyes: PERRL, non-icteric Cardiovascular: Tachycardic rate and rhythm, no murmurs, rubs, gallops Respiratory: Significantly decreased air movement bilaterally, mildly increased work of breathing with some accessory muscle use Abdomen: BS+, soft, non-tender, non-distended Extremities: Normal  bulk and range of motion, no edema, calves nontender, 2+ peripheral pulses, rough scarred hands Skin: Warm, dry, normal skin turgor Neuro: Alert and oriented, fine tremor of both hands Psych: Appropriate affect, accented speech, thoughts linear and goal-directed  EKG: Sinus tach, old anteroseptal infarct   Assessment & Plan by Problem: Active Problems:   COPD exacerbation (HCC)   Borderline hyperglycemia  COPD exacerbation, with acute onset dyspnea and wheezing starting tis morning, likely due to CAP / bilat bronchopneumoina, potentially post viral or viral. No significant cough. Mild leukocytosis but procalcitonin <0.10. Has no formal diagnosis of COPD but minimal medical contact and extensive smoking history and emphysema on CT. Poor air movement and wheezing today. CTA showed no PE. He received solumedol, duoneb, Mg. -- Persistently tachypneic with increased work of breathing, low threshold to ABG and consider trial BIPAP -- Supplemental O2 to maintain SpO2 greater than 92% -- Prednisone 40 mg daily -- Azithromycin 500 mg once then 250 mg for 4 days -- Duonebs Q6H -- Albuterol neb Q2H prn -- IV fluids overnight -- Trend CBC -- Will need PCP and formal PFTs outpatient  Alcoholism, previously heavy drinker, reports only 2-3 beers daily currently, denies having shakes when he stopping drinking -- CIWA protocol -- Thiamine, folate, multivitamin  FEN/GI: Regular diet, replete electrolytes as needed  DVT ppx: Lovenox  Code status: Full code  Dispo: Admit patient to Observation with expected length of stay less than 2 midnights.  Signed: Asencion Partridge, MD 03/26/2016, 10:53 PM  Pager: [phone]

## 2016-03-26 NOTE — ED Notes (Signed)
Pt resting no complaints of pain , pt O2 down to 3 liters n/c

## 2016-03-27 ENCOUNTER — Observation Stay (HOSPITAL_COMMUNITY): Payer: Self-pay

## 2016-03-27 DIAGNOSIS — F1020 Alcohol dependence, uncomplicated: Secondary | ICD-10-CM

## 2016-03-27 DIAGNOSIS — R251 Tremor, unspecified: Secondary | ICD-10-CM

## 2016-03-27 DIAGNOSIS — D72829 Elevated white blood cell count, unspecified: Secondary | ICD-10-CM

## 2016-03-27 DIAGNOSIS — R739 Hyperglycemia, unspecified: Secondary | ICD-10-CM

## 2016-03-27 DIAGNOSIS — R945 Abnormal results of liver function studies: Secondary | ICD-10-CM

## 2016-03-27 DIAGNOSIS — J441 Chronic obstructive pulmonary disease with (acute) exacerbation: Principal | ICD-10-CM

## 2016-03-27 LAB — DIFFERENTIAL
Basophils Absolute: 0 10*3/uL (ref 0.0–0.1)
Basophils Relative: 0 %
EOS ABS: 0 10*3/uL (ref 0.0–0.7)
EOS PCT: 0 %
LYMPHS ABS: 1.1 10*3/uL (ref 0.7–4.0)
LYMPHS PCT: 5 %
MONO ABS: 1.1 10*3/uL — AB (ref 0.1–1.0)
MONOS PCT: 5 %
NEUTROS PCT: 90 %
Neutro Abs: 18.4 10*3/uL — ABNORMAL HIGH (ref 1.7–7.7)

## 2016-03-27 LAB — CBC
HCT: 43.9 % (ref 39.0–52.0)
HEMOGLOBIN: 14.6 g/dL (ref 13.0–17.0)
MCH: 32.4 pg (ref 26.0–34.0)
MCHC: 33.3 g/dL (ref 30.0–36.0)
MCV: 97.3 fL (ref 78.0–100.0)
Platelets: 207 10*3/uL (ref 150–400)
RBC: 4.51 MIL/uL (ref 4.22–5.81)
RDW: 12.7 % (ref 11.5–15.5)
WBC: 20.6 10*3/uL — AB (ref 4.0–10.5)

## 2016-03-27 LAB — BASIC METABOLIC PANEL
ANION GAP: 8 (ref 5–15)
BUN: 18 mg/dL (ref 6–20)
CALCIUM: 9.1 mg/dL (ref 8.9–10.3)
CHLORIDE: 104 mmol/L (ref 101–111)
CO2: 27 mmol/L (ref 22–32)
CREATININE: 0.73 mg/dL (ref 0.61–1.24)
GFR calc non Af Amer: >60 mL/min (ref >60)
Glucose, Bld: 169 mg/dL — ABNORMAL HIGH (ref 65–99)
Potassium: 4.2 mmol/L (ref 3.5–5.1)
SODIUM: 139 mmol/L (ref 135–145)

## 2016-03-27 LAB — HEMOGLOBIN A1C
Hgb A1c MFr Bld: 5.7 % — ABNORMAL HIGH (ref 4.8–5.6)
Mean Plasma Glucose: 117 mg/dL

## 2016-03-27 LAB — HIV ANTIBODY (ROUTINE TESTING W REFLEX): HIV SCREEN 4TH GENERATION: NONREACTIVE

## 2016-03-27 MED ORDER — IPRATROPIUM-ALBUTEROL 0.5-2.5 (3) MG/3ML IN SOLN
3.0000 mL | Freq: Three times a day (TID) | RESPIRATORY_TRACT | Status: DC
  Administered 2016-03-27 – 2016-03-28 (×2): 3 mL via RESPIRATORY_TRACT
  Filled 2016-03-27 (×3): qty 3

## 2016-03-27 MED ORDER — FAMOTIDINE 20 MG PO TABS
20.0000 mg | ORAL_TABLET | Freq: Two times a day (BID) | ORAL | Status: DC
  Administered 2016-03-27 – 2016-03-28 (×4): 20 mg via ORAL
  Filled 2016-03-27 (×4): qty 1

## 2016-03-27 MED ORDER — IBUPROFEN 400 MG PO TABS
800.0000 mg | ORAL_TABLET | Freq: Once | ORAL | Status: AC
  Administered 2016-03-28: 800 mg via ORAL
  Filled 2016-03-27: qty 2

## 2016-03-27 NOTE — Progress Notes (Signed)
Subjective: Sitting up eating breakfast and breathing comfortably at the time of our visit. Reports his breathing is better than yesterday, slept well overnight. Reports some dry cough. No complaints. Had some reflux symptoms overnight and received Pepcid.  Objective: Vital signs in last 24 hours: Vitals:   03/26/16 2100 03/26/16 2123 03/27/16 0434 03/27/16 0727  BP: (!) 162/77  (!) 144/77   Pulse: (!) 109  77   Resp: 19  19   Temp: 98.4 F (36.9 C)  98.2 F (36.8 C)   TempSrc:      SpO2: 99% 92% 94% 93%  Height:        Intake/Output Summary (Last 24 hours) at 03/27/16 0946 Last data filed at 03/27/16 0556  Gross per 24 hour  Intake          1470.83 ml  Output                0 ml  Net          1470.83 ml   Physical Exam General appearance: Elderly gentleman resting comfortably in bed, conversational HENT: Normocephalic, atraumatic Cardiovascular: Regular rate and rhythm, no murmurs, rubs, gallops Respiratory/Chest: Decreased air movement bilaterally, normal work of breathing Abdomen: Soft, non-tender, non-distended Skin: Warm, dry, intact Ext: No edema, rough scarred hands with nail clubbing Neuro: Fine tremor of both hands, worse with movement, no asterixis Psych: Appropriate affect  Labs / Imaging / Procedures: CBC Latest Ref Rng & Units 03/27/2016 03/26/2016 06/28/2013  WBC 4.0 - 10.5 K/uL 20.6(H) 13.8(H) 8.0  Hemoglobin 13.0 - 17.0 g/dL 14.6 16.2 8.9(L)  Hematocrit 39.0 - 52.0 % 43.9 47.6 31.0(L)  Platelets 150 - 400 K/uL 207 200 259   BMP Latest Ref Rng & Units 03/27/2016 03/26/2016 06/25/2013  Glucose 65 - 99 mg/dL 169(H) 139(H) 104(H)  BUN 6 - 20 mg/dL '18 9 17  '$ Creatinine 0.61 - 1.24 mg/dL 0.73 0.78 0.63  Sodium 135 - 145 mmol/L 139 139 139  Potassium 3.5 - 5.1 mmol/L 4.2 4.9 4.5  Chloride 101 - 111 mmol/L 104 101 99  CO2 22 - 32 mmol/L 27 29 32  Calcium 8.9 - 10.3 mg/dL 9.1 9.5 9.4    Assessment/Plan: [name] is a 55 y.o. man with PMH tobacco  use admitted for COPD exacerbation.   Active Problems:   COPD exacerbation (HCC)   Borderline hyperglycemia  COPD exacerbation, with acute onset dyspnea and wheezing starting morning of admission, likely due to CAP vs viral pneumonia. No significant cough, dry. Mild leukocytosis but procalcitonin <0.10. Has no formal diagnosis of COPD but minimal medical contact and extensive smoking history and emphysema on CT. Work of breathing has significantly improved overnight and he is feeling better. -- Supplemental O2 to maintain SpO2 greater than 92%, wean as tolerated -- Check ambulatory saturations today -- Prednisone 40 mg daily for 5 days total, end 3/28 -- Azithromycin 250 mg for 4 days, end 3/28 -- Duonebs Q6H -- Albuterol neb Q2H prn -- Will need PCP and formal PFTs outpatient  Leukocytosis, WBC up to 20.4 today from 13 yesterday, may be demargination due to steroids, does appear infected or clinically worsening -- Obtain differential on previously collected CBC this AM  Alcoholism, previously heavy drinker, reports only 2-3 beers daily currently, denies having shakes when he stopping drinking -- CIWA protocol -- Thiamine, folate, multivitamin  Mild hyperglycemia, attrib to steroids -- Normal A1c  Mild LFT elevation - screen HCV  FEN/GI: Regular diet, replete electrolytes  as needed  Dispo: Anticipated discharge in approximately 0-1 day(s).    LOS: 0 days   Asencion Partridge, MD 03/27/2016, 8:42 AM Pager: [phone]

## 2016-03-27 NOTE — Progress Notes (Signed)
James [name] is improving but with persistent dyspnea and oxygen desaturation to high 80s on room air while ambulating. Recommend remaining inpatient for now in anticipation of continued clinical improvement with treatment of his COPD exacerbation.

## 2016-03-27 NOTE — Progress Notes (Signed)
SATURATION QUALIFICATIONS: (This note is used to comply with regulatory documentation for home oxygen)  Patient Saturations on Room Air at Rest = 91%  Patient Saturations on Room Air while Ambulating = 88%

## 2016-03-28 LAB — COMPREHENSIVE METABOLIC PANEL
ALK PHOS: 38 U/L (ref 38–126)
ALT: 43 U/L (ref 17–63)
ANION GAP: 11 (ref 5–15)
AST: 25 U/L (ref 15–41)
Albumin: 2.9 g/dL — ABNORMAL LOW (ref 3.5–5.0)
BUN: 17 mg/dL (ref 6–20)
CALCIUM: 8.6 mg/dL — AB (ref 8.9–10.3)
CO2: 22 mmol/L (ref 22–32)
CREATININE: 0.74 mg/dL (ref 0.61–1.24)
Chloride: 106 mmol/L (ref 101–111)
GFR calc non Af Amer: >60 mL/min (ref >60)
Glucose, Bld: 122 mg/dL — ABNORMAL HIGH (ref 65–99)
Potassium: 4.1 mmol/L (ref 3.5–5.1)
SODIUM: 139 mmol/L (ref 135–145)
Total Bilirubin: 1.1 mg/dL (ref 0.3–1.2)
Total Protein: 5.5 g/dL — ABNORMAL LOW (ref 6.5–8.1)

## 2016-03-28 LAB — CBC
HEMATOCRIT: 43.4 % (ref 39.0–52.0)
HEMOGLOBIN: 14.2 g/dL (ref 13.0–17.0)
MCH: 32.3 pg (ref 26.0–34.0)
MCHC: 32.7 g/dL (ref 30.0–36.0)
MCV: 98.9 fL (ref 78.0–100.0)
Platelets: 183 10*3/uL (ref 150–400)
RBC: 4.39 MIL/uL (ref 4.22–5.81)
RDW: 13 % (ref 11.5–15.5)
WBC: 15.8 10*3/uL — ABNORMAL HIGH (ref 4.0–10.5)

## 2016-03-28 MED ORDER — AZITHROMYCIN 250 MG PO TABS: 250.0000 mg | ORAL_TABLET | Freq: Every day | ORAL | 0 refills | Status: DC

## 2016-03-28 MED ORDER — ALBUTEROL SULFATE HFA 108 (90 BASE) MCG/ACT IN AERS: 2.0000 | INHALATION_SPRAY | Freq: Four times a day (QID) | RESPIRATORY_TRACT | 2 refills | Status: DC | PRN

## 2016-03-28 MED ORDER — ALBUTEROL SULFATE (2.5 MG/3ML) 0.083% IN NEBU: 3.0000 mL | INHALATION_SOLUTION | Freq: Four times a day (QID) | RESPIRATORY_TRACT | Status: DC | PRN

## 2016-03-28 MED ORDER — PREDNISONE 20 MG PO TABS: 40.0000 mg | ORAL_TABLET | Freq: Every day | ORAL | 0 refills | Status: DC

## 2016-03-28 NOTE — Progress Notes (Signed)
SATURATION QUALIFICATIONS: (This note is used to comply with regulatory documentation for home oxygen)  Patient Saturations on Room Air at Rest = 94%  Patient Saturations on Room Air while Ambulating = 93%

## 2016-03-28 NOTE — Progress Notes (Signed)
James [name] to be D/C'd home per MD order.  Discussed with the patient and all questions fully answered.  VSS, Skin clean, dry and intact without evidence of skin break down, no evidence of skin tears noted. IV catheter discontinued intact. Site without signs and symptoms of complications. Dressing and pressure applied.  An After Visit Summary was printed and given to the patient. Patient received prescription.  D/c education completed with patient/family including follow up instructions, medication list, d/c activities limitations if indicated, with other d/c instructions as indicated by MD - patient able to verbalize understanding, all questions fully answered.   Patient instructed to return to ED, call 911, or call MD for any changes in condition.   Patient escorted via [place], and D/C home via private auto.  Milas Hock 03/28/2016 2:49 PM

## 2016-03-28 NOTE — Progress Notes (Signed)
   Subjective: Patient reports feeling well. States his breathing has improved and he slept well overnight. No other complaints.   Objective: Vital signs in last 24 hours: Vitals:   03/27/16 1528 03/27/16 2127 03/28/16 0542 03/28/16 0740  BP: (!) 146/97 (!) 154/83 (!) 146/94   Pulse: (!) 118 (!) 111 77   Resp: '20 19 20   '$ Temp: 98.2 F (36.8 C) 98.4 F (36.9 C) 98.2 F (36.8 C)   TempSrc: Oral     SpO2: 92% 97% 97% 93%  Height:        Intake/Output Summary (Last 24 hours) at 03/28/16 0744 Last data filed at 03/28/16 2841  Gross per 24 hour  Intake             1410 ml  Output              300 ml  Net             1110 ml   Physical Exam General appearance: Elderly gentleman resting comfortably in bed watching the television, conversational Cardiovascular: Regular rate and rhythm, no murmurs, rubs, gallops Respiratory/Chest: Decreased air movement bilaterally, normal work of breathing, mild end-expiratory wheezing  Abdomen: Soft, non-tender, non-distended, +BS Skin: Warm, dry, intact Ext: No edema, rough scarred hands with nail clubbing Psych: Appropriate affect  Labs / Imaging / Procedures: CBC Latest Ref Rng & Units 03/28/2016 03/27/2016 03/26/2016  WBC 4.0 - 10.5 K/uL 15.8(H) 20.6(H) 13.8(H)  Hemoglobin 13.0 - 17.0 g/dL 14.2 14.6 16.2  Hematocrit 39.0 - 52.0 % 43.4 43.9 47.6  Platelets 150 - 400 K/uL 183 207 200   BMP Latest Ref Rng & Units 03/28/2016 03/27/2016 03/26/2016  Glucose 65 - 99 mg/dL 122(H) 169(H) 139(H)  BUN 6 - 20 mg/dL '17 18 9  '$ Creatinine 0.61 - 1.24 mg/dL 0.74 0.73 0.78  Sodium 135 - 145 mmol/L 139 139 139  Potassium 3.5 - 5.1 mmol/L 4.1 4.2 4.9  Chloride 101 - 111 mmol/L 106 104 101  CO2 22 - 32 mmol/L '22 27 29  '$ Calcium 8.9 - 10.3 mg/dL 8.6(L) 9.1 9.5    Assessment/Plan: Mr. DIANA ARMIJO is a 56 y.o. man with PMH tobacco use admitted for COPD exacerbation.   Active Problems:   COPD exacerbation (HCC)   Borderline hyperglycemia  COPD  exacerbation, Has no formal diagnosis of COPD but minimal medical contact and extensive smoking history and emphysema on CT. Work of breathing has significantly improved and he is feeling better. Currently satting well on RA.  -- Supplemental O2 prn -- Check ambulatory saturations today -- Prednisone 40 mg daily for 5 days total, end 3/27 -- Azithromycin 250 mg for 4 days, end 3/27 -- Duonebs Q6H -- Albuterol neb Q2H prn -- Will need PCP and formal PFTs outpatient  Leukocytosis, WBC trended down from 20 yesterday to 15 today. Likely from demargination due to steroids, no signs of systemic infection.   Alcoholism, previously heavy drinker, reports only 2-3 beers daily currently, denies having shakes when he stopping drinking -- CIWA protocol -- Thiamine, folate, multivitamin  Mild hyperglycemia (resolved), attrib to steroids. CBG 122 this am.  -- Normal A1c  Mild LFT elevation (resolved) - LFTs normal this morning. screen HCV  FEN/GI: Regular diet, replete electrolytes as needed  Dispo: Anticipated discharge in approximately 0-1 day(s).    LOS: 1 day   Shela Leff, MD 03/28/2016, 7:44 AM Pager: [phone]

## 2016-03-28 NOTE — Discharge Instructions (Signed)
Continue taking Prednisone for another 2 days as instructed; next dose 03/29/2016  Continue taking Azithromycin for another 2 days as instructed; next dose 03/29/2016  Use Albuterol inhaler as instructed for shortness of breath or wheezing

## 2016-03-29 DIAGNOSIS — D72829 Elevated white blood cell count, unspecified: Secondary | ICD-10-CM

## 2016-03-29 NOTE — Discharge Summary (Signed)
Name: CABOT CROMARTIE MRN: [id number] DOB: Apr 08, 1960 56 y.o. PCP: No Pcp Per Patient  Date of Admission: 03/26/2016  9:18 AM Date of Discharge: 03/28/2016 Attending Physician: Gilles Chiquito, MD  Discharge Diagnosis: 1. COPD exacerbation 2. Leukocytosis 3. Borderline hyperglycemia  Principal Problem:   COPD exacerbation (HCC) Active Problems:   Borderline hyperglycemia   Leukocytosis   Discharge Medications: Allergies as of 03/28/2016   No Known Allergies     Medication List    STOP taking these medications   clindamycin 300 MG capsule Commonly known as:  CLEOCIN     TAKE these medications   albuterol 108 (90 Base) MCG/ACT inhaler Commonly known as:  PROVENTIL HFA;VENTOLIN HFA Inhale 2 puffs into the lungs every 6 (six) hours as needed for wheezing or shortness of breath.   amitriptyline 25 MG tablet Commonly known as:  ELAVIL Take 1 tablet (25 mg total) by mouth at bedtime.   azithromycin 250 MG tablet Commonly known as:  ZITHROMAX Take 1 tablet (250 mg total) by mouth daily.   folic acid 1 MG tablet Commonly known as:  FOLVITE Take 1 tablet (1 mg total) by mouth daily.   HYDROcodone-acetaminophen 5-325 MG tablet Commonly known as:  NORCO/VICODIN Take 2 tablets by mouth every 4 (four) hours as needed.   ibuprofen 800 MG tablet Commonly known as:  ADVIL,MOTRIN Take 1 tablet (800 mg total) by mouth every 8 (eight) hours as needed for mild pain or moderate pain.   iron polysaccharides 150 MG capsule Commonly known as:  NIFEREX Take 1 capsule (150 mg total) by mouth 2 (two) times daily before lunch and supper.   methocarbamol 500 MG tablet Commonly known as:  ROBAXIN Take 1 tablet (500 mg total) by mouth every 6 (six) hours as needed for muscle spasms.   predniSONE 20 MG tablet Commonly known as:  DELTASONE Take 2 tablets (40 mg total) by mouth daily with breakfast.   tamsulosin 0.4 MG Caps capsule Commonly known as:  FLOMAX Take 1 capsule (0.4 mg  total) by mouth daily after supper.       Disposition and follow-up:   Mr.Kallin D Rubenstein was discharged from [facility] in Stable condition.  At the hospital follow up visit please address:  1. COPD exacerbation - assess breathing and exercise tolerance, cough, need for Albuterol inhaler  Leukocytosis - assess white cell count and different for worsening or normalization  Borderline hyperglycemia - assess blood glucose  2.  Labs / imaging needed at time of follow-up: CBC w diff, BMP, PFTs  3.  Pending labs/ test needing follow-up: None  Follow-up Appointments: Follow-up [place] Follow up.   Why:  Please call and make an appointment for a follow-up visit in 1 week.  Contact information: [place] [phone] [facility] Course by problem list: Principal Problem:   COPD exacerbation ([place]) Active Problems:   Borderline hyperglycemia   Leukocytosis   1. COPD exacerbation  Mr. Dennin is a 56 y.o. gentleman with PMH tobacco abuse and traumatic pneumothorax (2015) who presented to the ED on 3/23 for worsening shortness of breath, wheezing, dyspnea on exertion, and fatigue. EMS found with with SpO2 80% on room air, he received Solumedrol, Mg, Duoneb prior to arrival. He exhibited significant respiratory distress on admission with tachypnea, wheezing, and increased work of breathing.   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 Report of sharp left calf pain prompted CTA chest which revealed no PE but bilateral emphysematous changes, hyperinflation, and potential bilateral atypical bronchopneumonia. On exam he was noted to have increased AP diameter (barrel chest) and decreased air movement bilaterally on lung auscultation. Although he has never been formally diagnosed with COPD, his  presentation, risk factors, and exam were most consistent with this diagnosis. He was started on daily Prednisone, Azithromycin, scheduled Duonebs, and supplemental O2 as needed. His symptoms and tachypnea improved by 3/24 however continued to experience desaturation to 88% with ambulation on room air and dyspnea. His symptoms continued to improve and he was deemed stable for discharge on 3/25 after ambulating well without need for supplementary oxygen. He was discharged with Prednisone, Azithromycin, Albuterol inhaler added to his home meds and instructed to establish and follow up with [place] and wellness.   2. Leukocytosis - presented with leukocytosis to WBC 13.8 initially concerning/attributed to infection. He received Solumedrol via EMS and was started on daily oral prednisone for COPD exacerbation. WBC climbed to 20.6 on hospital day 2, differential revealed strong neutrophil predominance and no left shift and he was clinically improving. Thus leukocytosis ultimately attributed to steroid effect.   3. Borderline hyperglycemia - blood glucose was elevated on admission to 169, attributed to stress response and steroids, serial checks showed normalization and HbA1c was normal at 5.7.  Discharge Vitals:   BP (!) 146/94 (BP Location: Left Arm)   Pulse 77   Temp 98.2 F (36.8 C)   Resp 20   Ht 5' 9.5" (1.765 m)   SpO2 93%   Pertinent Labs, Studies, and Procedures:  CBC Latest Ref Rng & Units 03/28/2016 03/27/2016 03/26/2016  WBC 4.0 - 10.5 K/uL 15.8(H) 20.6(H) 13.8(H)  Hemoglobin 13.0 - 17.0 g/dL 14.2 14.6 16.2  Hematocrit 39.0 - 52.0 % 43.4 43.9 47.6  Platelets 150 - 400 K/uL 183 207 200   CMP Latest Ref Rng & Units 03/28/2016 03/27/2016 03/26/2016  Glucose 65 - 99 mg/dL 122(H) 169(H) 139(H)  BUN 6 - 20 mg/dL '17 18 9  '$ Creatinine 0.61 - 1.24 mg/dL 0.74 0.73 0.78  Sodium 135 - 145 mmol/L 139 139 139  Potassium 3.5 - 5.1 mmol/L 4.1 4.2 4.9  Chloride 101 - 111 mmol/L 106 104 101  CO2 22 -  32 mmol/L '22 27 29  '$ Calcium 8.9 - 10.3 mg/dL 8.6(L) 9.1 9.5  Total Protein 6.5 - 8.1 g/dL 5.5(L) - 6.5  Total Bilirubin 0.3 - 1.2 mg/dL 1.1 - 0.6  Alkaline Phos 38 - 126 U/L 38 - 40  AST 15 - 41 U/L 25 - 48(H)  ALT 17 - 63 U/L 43 - 79(H)    X-ray Chest Pa And Lateral  Result Date: 03/27/2016 CLINICAL DATA:  COPD exacerbation EXAM: CHEST  2 VIEW COMPARISON:  Chest radiograph and chest CT from 1 day prior. FINDINGS: Stable cardiomediastinal silhouette with normal heart size and aortic atherosclerosis. No pneumothorax. No pleural effusion. Mild hazy left parahilar lung opacities appears slightly worsened. Hyperinflated lungs. IMPRESSION: Mild hazy left parahilar lung opacities, slightly worsened, which may represent infection. Recommend follow-up PA and lateral post treatment chest radiographs in 4-6 weeks. Hyperinflated lungs, compatible with the provided history of COPD. Electronically Signed   By: Ilona Sorrel M.D.   On: 03/27/2016 10:01   Ct Angio Chest Pe W And/or Wo Contrast  Result Date: 03/26/2016 CLINICAL DATA:  Quit smoking 3 years ago, sudden onset of shortness of Breath EXAM: CT ANGIOGRAPHY CHEST WITH CONTRAST TECHNIQUE: Multidetector CT imaging of the chest was performed using the standard protocol during bolus administration of intravenous contrast. Multiplanar CT image reconstructions and MIPs were obtained to evaluate the vascular anatomy. CONTRAST:  100 cc Isovue COMPARISON:  06/09/2013 FINDINGS: Cardiovascular: The study is of excellent technical quality. No pulmonary embolus is noted. No aortic aneurysm or aortic dissection. Mild atherosclerotic calcifications of thoracic aorta. Heart size within normal limits. No pericardial effusion. Mediastinum/Nodes: No mediastinal hematoma or adenopathy. No hilar adenopathy is noted. There is small hiatal hernia. Nonspecific mild thickening of distal esophageal wall. Gastroesophageal reflux cannot be excluded. Clinical correlation is necessary.  Lungs/Pleura: Images of the lung parenchyma shows bilateral hyperinflation. Emphysematous changes are noted especially in upper lobes. No evidence of pulmonary edema. No pneumothorax. No bronchiectasis. Subtle mild central bronchitic changes are noted bilaterally. There is a patchy infiltrate with peribronchovascular central distribution in left upper lobe and left lower lobe. Findings highly suspicious for early atypical bronchopneumonia. No segmental consolidation. The right lung is clear. No pleural thickening or pleural plaques. Upper Abdomen: Visualized upper abdomen shows no adrenal gland mass. Visualized liver, pancreas and spleen is unremarkable. Visualized upper kidneys are unremarkable. Musculoskeletal: No destructive bony lesions are noted. Multiple bilateral healed old rib fractures are noted. Sagittal images of the spine shows mild degenerative changes lower thoracic spine. Review of the MIP images confirms the above findings. IMPRESSION: 1. No evidence of pulmonary embolus. No aortic aneurysm or dissection. Patent central airways. 2. Bilateral emphysematous changes are noted especially in upper lobes. Hyperinflation is noted. Subtle mild bronchitic changes bilaterally. There is patchy central infiltrate with bronchovascular distribution in left upper lobe axial image 59 and 63 and left lower lobe axial image 94 on lung images. Findings highly suspicious for bilateral atypical bronchopneumonia. Follow-up to resolution is recommended. 3. No bronchiectasis.  No pulmonary edema.  No pneumothorax. 4. Nonspecific mild thickening of distal esophageal wall. Gastroesophageal reflux cannot be excluded. 5. Mild degenerative changes lower thoracic spine. Electronically Signed   By: Lahoma Crocker M.D.   On: 03/26/2016 12:10   Dg Chest Port 1 View  Result Date: 03/26/2016 CLINICAL DATA:  Shortness of breath, history of smoking, quit smoking 3 years ago EXAM: PORTABLE CHEST 1 VIEW COMPARISON:  11/09/2013 FINDINGS:  Cardiomediastinal silhouette is stable. Hyperinflation again noted. No infiltrate or pulmonary edema. Stable old healed right lower rib fractures. Stable old left upper rib fracture deformities. IMPRESSION: No active disease. Stable bilateral old rib fractures. Mild hyperinflation. Electronically Signed   By: Lahoma Crocker M.D.   On: 03/26/2016 10:05   Discharge Instructions:   Signed: Asencion Partridge, MD 03/29/2016, 1:01 PM   Pager: [phone]

## 2016-03-31 LAB — HEPATITIS C ANTIBODY

## 2016-04-06 ENCOUNTER — Emergency Department (HOSPITAL_COMMUNITY): Payer: Self-pay

## 2016-04-06 ENCOUNTER — Inpatient Hospital Stay (HOSPITAL_COMMUNITY)
Admission: EM | Admit: 2016-04-06 | Discharge: 2016-04-08 | DRG: 192 | Disposition: A | Discharge status: Home / Self Care | Payer: Self-pay | Attending: Oncology | Admitting: Oncology

## 2016-04-06 ENCOUNTER — Encounter (HOSPITAL_COMMUNITY): Payer: Self-pay | Admitting: Emergency Medicine

## 2016-04-06 DIAGNOSIS — F1010 Alcohol abuse, uncomplicated: Secondary | ICD-10-CM

## 2016-04-06 DIAGNOSIS — J189 Pneumonia, unspecified organism: Secondary | ICD-10-CM | POA: Diagnosis present

## 2016-04-06 DIAGNOSIS — F17200 Nicotine dependence, unspecified, uncomplicated: Secondary | ICD-10-CM

## 2016-04-06 DIAGNOSIS — Z8701 Personal history of pneumonia (recurrent): Secondary | ICD-10-CM

## 2016-04-06 DIAGNOSIS — F1020 Alcohol dependence, uncomplicated: Secondary | ICD-10-CM | POA: Diagnosis present

## 2016-04-06 DIAGNOSIS — R748 Abnormal levels of other serum enzymes: Secondary | ICD-10-CM | POA: Diagnosis present

## 2016-04-06 DIAGNOSIS — J441 Chronic obstructive pulmonary disease with (acute) exacerbation: Principal | ICD-10-CM | POA: Diagnosis present

## 2016-04-06 DIAGNOSIS — F1290 Cannabis use, unspecified, uncomplicated: Secondary | ICD-10-CM | POA: Diagnosis present

## 2016-04-06 DIAGNOSIS — Z87891 Personal history of nicotine dependence: Secondary | ICD-10-CM

## 2016-04-06 DIAGNOSIS — R945 Abnormal results of liver function studies: Secondary | ICD-10-CM

## 2016-04-06 DIAGNOSIS — J449 Chronic obstructive pulmonary disease, unspecified: Secondary | ICD-10-CM | POA: Diagnosis present

## 2016-04-06 DIAGNOSIS — Z8709 Personal history of other diseases of the respiratory system: Secondary | ICD-10-CM

## 2016-04-06 HISTORY — DX: Pneumonia, unspecified organism: J18.9

## 2016-04-06 HISTORY — DX: Chronic obstructive pulmonary disease, unspecified: J44.9

## 2016-04-06 LAB — I-STAT CG4 LACTIC ACID, ED: Lactic Acid, Venous: 1.68 mmol/L (ref 0.5–1.9)

## 2016-04-06 LAB — GLUCOSE, CAPILLARY
GLUCOSE-CAPILLARY: 225 mg/dL — AB (ref 65–99)
Glucose-Capillary: 266 mg/dL — ABNORMAL HIGH (ref 65–99)

## 2016-04-06 LAB — CBC WITH DIFFERENTIAL/PLATELET
BASOS ABS: 0 10*3/uL (ref 0.0–0.1)
BASOS PCT: 0 %
Eosinophils Absolute: 0.1 10*3/uL (ref 0.0–0.7)
Eosinophils Relative: 1 %
HEMATOCRIT: 46.6 % (ref 39.0–52.0)
HEMOGLOBIN: 15.4 g/dL (ref 13.0–17.0)
LYMPHS PCT: 11 %
Lymphs Abs: 2.6 10*3/uL (ref 0.7–4.0)
MCH: 32.1 pg (ref 26.0–34.0)
MCHC: 33 g/dL (ref 30.0–36.0)
MCV: 97.1 fL (ref 78.0–100.0)
Monocytes Absolute: 1.4 10*3/uL — ABNORMAL HIGH (ref 0.1–1.0)
Monocytes Relative: 6 %
NEUTROS ABS: 19.2 10*3/uL — AB (ref 1.7–7.7)
NEUTROS PCT: 82 %
Platelets: 255 10*3/uL (ref 150–400)
RBC: 4.8 MIL/uL (ref 4.22–5.81)
RDW: 13.1 % (ref 11.5–15.5)
WBC: 23.3 10*3/uL — ABNORMAL HIGH (ref 4.0–10.5)

## 2016-04-06 LAB — COMPREHENSIVE METABOLIC PANEL
ALT: 69 U/L — ABNORMAL HIGH (ref 17–63)
ANION GAP: 8 (ref 5–15)
AST: 39 U/L (ref 15–41)
Albumin: 3.6 g/dL (ref 3.5–5.0)
Alkaline Phosphatase: 47 U/L (ref 38–126)
BILIRUBIN TOTAL: 0.4 mg/dL (ref 0.3–1.2)
BUN: 12 mg/dL (ref 6–20)
CO2: 27 mmol/L (ref 22–32)
Calcium: 9.1 mg/dL (ref 8.9–10.3)
Chloride: 105 mmol/L (ref 101–111)
Creatinine, Ser: 0.8 mg/dL (ref 0.61–1.24)
GFR calc non Af Amer: >60 mL/min (ref >60)
GLUCOSE: 161 mg/dL — AB (ref 65–99)
POTASSIUM: 4.1 mmol/L (ref 3.5–5.1)
SODIUM: 140 mmol/L (ref 135–145)
TOTAL PROTEIN: 6.8 g/dL (ref 6.5–8.1)

## 2016-04-06 LAB — URINALYSIS, ROUTINE W REFLEX MICROSCOPIC
BACTERIA UA: NONE SEEN
BILIRUBIN URINE: NEGATIVE
Glucose, UA: >=500 mg/dL — AB
Hgb urine dipstick: NEGATIVE
KETONES UR: NEGATIVE mg/dL
LEUKOCYTES UA: NEGATIVE
NITRITE: NEGATIVE
PROTEIN: NEGATIVE mg/dL
RBC / HPF: NONE SEEN RBC/hpf (ref 0–5)
SQUAMOUS EPITHELIAL / LPF: NONE SEEN
Specific Gravity, Urine: 1.012 (ref 1.005–1.030)
pH: 5 (ref 5.0–8.0)

## 2016-04-06 LAB — INFLUENZA PANEL BY PCR (TYPE A & B)
INFLAPCR: NEGATIVE
Influenza B By PCR: NEGATIVE

## 2016-04-06 MED ORDER — PREDNISONE 20 MG PO TABS
40.0000 mg | ORAL_TABLET | Freq: Every day | ORAL | Status: DC
  Administered 2016-04-07 – 2016-04-08 (×2): 40 mg via ORAL
  Filled 2016-04-06 (×2): qty 2

## 2016-04-06 MED ORDER — THIAMINE HCL 100 MG/ML IJ SOLN: 100.0000 mg | Freq: Every day | INTRAMUSCULAR | Status: DC

## 2016-04-06 MED ORDER — ENOXAPARIN SODIUM 40 MG/0.4ML ~~LOC~~ SOLN
40.0000 mg | SUBCUTANEOUS | Status: DC
  Administered 2016-04-06 – 2016-04-07 (×2): 40 mg via SUBCUTANEOUS
  Filled 2016-04-06 (×2): qty 0.4

## 2016-04-06 MED ORDER — VITAMIN B-1 100 MG PO TABS
100.0000 mg | ORAL_TABLET | Freq: Every day | ORAL | Status: DC
  Administered 2016-04-06 – 2016-04-08 (×3): 100 mg via ORAL
  Filled 2016-04-06 (×3): qty 1

## 2016-04-06 MED ORDER — IPRATROPIUM BROMIDE 0.02 % IN SOLN
0.5000 mg | Freq: Once | RESPIRATORY_TRACT | Status: AC
  Administered 2016-04-06: 0.5 mg via RESPIRATORY_TRACT
  Filled 2016-04-06: qty 2.5

## 2016-04-06 MED ORDER — CEFEPIME HCL 2 G IJ SOLR
2.0000 g | Freq: Three times a day (TID) | INTRAMUSCULAR | Status: DC
  Administered 2016-04-06 – 2016-04-08 (×6): 2 g via INTRAVENOUS
  Filled 2016-04-06 (×7): qty 2

## 2016-04-06 MED ORDER — ALBUTEROL SULFATE (2.5 MG/3ML) 0.083% IN NEBU: 2.5000 mg | INHALATION_SOLUTION | RESPIRATORY_TRACT | Status: DC | PRN

## 2016-04-06 MED ORDER — ADULT MULTIVITAMIN W/MINERALS CH
1.0000 | ORAL_TABLET | Freq: Every day | ORAL | Status: DC
Start: 2016-04-06 — End: 2016-04-08
  Administered 2016-04-06 – 2016-04-08 (×3): 1 via ORAL
  Filled 2016-04-06 (×3): qty 1

## 2016-04-06 MED ORDER — IPRATROPIUM-ALBUTEROL 0.5-2.5 (3) MG/3ML IN SOLN
3.0000 mL | Freq: Four times a day (QID) | RESPIRATORY_TRACT | Status: DC
  Administered 2016-04-06 – 2016-04-07 (×3): 3 mL via RESPIRATORY_TRACT
  Filled 2016-04-06 (×2): qty 3

## 2016-04-06 MED ORDER — LORAZEPAM 1 MG PO TABS: 1.0000 mg | ORAL_TABLET | Freq: Four times a day (QID) | ORAL | Status: DC | PRN

## 2016-04-06 MED ORDER — ACETAMINOPHEN 650 MG RE SUPP: 650.0000 mg | Freq: Four times a day (QID) | RECTAL | Status: DC | PRN

## 2016-04-06 MED ORDER — INSULIN ASPART 100 UNIT/ML ~~LOC~~ SOLN
0.0000 [IU] | Freq: Three times a day (TID) | SUBCUTANEOUS | Status: DC
  Administered 2016-04-06: 5 [IU] via SUBCUTANEOUS
  Administered 2016-04-07: 3 [IU] via SUBCUTANEOUS
  Administered 2016-04-07 (×2): 1 [IU] via SUBCUTANEOUS
  Administered 2016-04-08: 2 [IU] via SUBCUTANEOUS

## 2016-04-06 MED ORDER — ALUM & MAG HYDROXIDE-SIMETH 200-200-20 MG/5ML PO SUSP
30.0000 mL | ORAL | Status: DC | PRN
  Administered 2016-04-06: 30 mL via ORAL
  Filled 2016-04-06: qty 30

## 2016-04-06 MED ORDER — SODIUM CHLORIDE 0.9% FLUSH
3.0000 mL | Freq: Two times a day (BID) | INTRAVENOUS | Status: DC
  Administered 2016-04-06 – 2016-04-08 (×2): 3 mL via INTRAVENOUS

## 2016-04-06 MED ORDER — MAGNESIUM SULFATE 2 GM/50ML IV SOLN
2.0000 g | Freq: Once | INTRAVENOUS | Status: AC
  Administered 2016-04-06: 2 g via INTRAVENOUS
  Filled 2016-04-06: qty 50

## 2016-04-06 MED ORDER — VANCOMYCIN HCL IN DEXTROSE 1-5 GM/200ML-% IV SOLN
1000.0000 mg | Freq: Three times a day (TID) | INTRAVENOUS | Status: DC
  Administered 2016-04-06 – 2016-04-07 (×3): 1000 mg via INTRAVENOUS
  Filled 2016-04-06 (×4): qty 200

## 2016-04-06 MED ORDER — SODIUM CHLORIDE 0.9 % IV BOLUS (SEPSIS)
1000.0000 mL | Freq: Once | INTRAVENOUS | Status: AC
  Administered 2016-04-06: 1000 mL via INTRAVENOUS

## 2016-04-06 MED ORDER — DEXTROSE 5 % IV SOLN
2.0000 g | Freq: Once | INTRAVENOUS | Status: AC
  Administered 2016-04-06: 2 g via INTRAVENOUS
  Filled 2016-04-06: qty 2

## 2016-04-06 MED ORDER — ALBUTEROL (5 MG/ML) CONTINUOUS INHALATION SOLN
15.0000 mg/h | INHALATION_SOLUTION | Freq: Once | RESPIRATORY_TRACT | Status: AC
  Administered 2016-04-06: 15 mg/h via RESPIRATORY_TRACT
  Filled 2016-04-06: qty 20

## 2016-04-06 MED ORDER — ACETAMINOPHEN 325 MG PO TABS
650.0000 mg | ORAL_TABLET | Freq: Four times a day (QID) | ORAL | Status: DC | PRN
  Filled 2016-04-06: qty 2

## 2016-04-06 MED ORDER — ALBUTEROL SULFATE HFA 108 (90 BASE) MCG/ACT IN AERS
2.0000 | INHALATION_SPRAY | RESPIRATORY_TRACT | Status: DC | PRN
  Administered 2016-04-06: 2 via RESPIRATORY_TRACT
  Filled 2016-04-06: qty 6.7

## 2016-04-06 MED ORDER — GUAIFENESIN ER 600 MG PO TB12
600.0000 mg | ORAL_TABLET | Freq: Two times a day (BID) | ORAL | Status: DC
Start: 2016-04-06 — End: 2016-04-08
  Administered 2016-04-06 – 2016-04-08 (×4): 600 mg via ORAL
  Filled 2016-04-06 (×4): qty 1

## 2016-04-06 MED ORDER — FOLIC ACID 1 MG PO TABS
1.0000 mg | ORAL_TABLET | Freq: Every day | ORAL | Status: DC
  Administered 2016-04-06 – 2016-04-08 (×3): 1 mg via ORAL
  Filled 2016-04-06 (×3): qty 1

## 2016-04-06 MED ORDER — VANCOMYCIN HCL IN DEXTROSE 1-5 GM/200ML-% IV SOLN
1000.0000 mg | Freq: Once | INTRAVENOUS | Status: AC
  Administered 2016-04-06: 1000 mg via INTRAVENOUS
  Filled 2016-04-06: qty 200

## 2016-04-06 MED ORDER — LORAZEPAM 2 MG/ML IJ SOLN: 1.0000 mg | Freq: Four times a day (QID) | INTRAMUSCULAR | Status: DC | PRN

## 2016-04-06 NOTE — ED Notes (Addendum)
PT reports mild relief since use of inhaler .   He reports he feels as though he needs additional breathing tx.

## 2016-04-06 NOTE — ED Notes (Signed)
PT reports feeling tightness in central chest and slightly sob. Asking for something to help with breathing.

## 2016-04-06 NOTE — H&P (Signed)
Date: 04/06/2016               Patient Name:  James [name] MRN: [id number]  DOB: 02-Nov-1960 Age / Sex: 56 y.o.,  male   PCP: No Pcp Per Patient         Medical Service: Internal Medicine Teaching Service         Attending Physician: Dr. Annia Belt, MD    First Contact: Dr. Philipp Ovens Pager: [phone]  Second Contact: Dr. Posey Pronto Pager: [phone]       After Hours (After 5p/  First Contact Pager: [phone]  weekends / holidays): Second Contact Pager: [phone]   Chief Complaint: shortness of breath  History of Present Illness: James [name] is a 56 year old male with past medical history of tobacco abuse, history of traumatic hemothorax, and likely COPD who presents to the emergency department with complaint of shortness of breath.  Patient was recently admitted from 3/23 to 3/25 with a likely COPD exacerbation. Patient symptoms are consistent with COPD, but patient does not have an official diagnosis of COPD from PFTs. At that time, patient presented with shortness of breath, wheezing, dyspnea on exertion and fatigue. He was satting 80% on room air. CTA chest was negative for pulmonary embolism but showed bilateral emphysematous changes, hyperinflation and potential bilateral atypical bronchopneumonia. Patient was treated with prednisone, azithromycin, DuoNeb's and albuterol as needed. Patient was to follow-up with [facility] and wellness on discharge. Since discharge, patient reports he was feeling much better and was without shortness of breath and his energy was improved. However, he took his last prednisone pill on Sunday. On Monday morning, patient again had worsening shortness of breath, nonproductive cough, fever, chills, nausea, weakness. At baseline, patient gets dyspneic with exertion, but is not short of breath at rest. He denies symptoms of orthopnea, weight gain, lower extremity edema.  In the emergency department, patient was febrile to 101.5, hypertensive, tachycardic to  140, tachypneic with a rate of 31 and satting 92% on room air. Patient also had a leukocytosis of 23.3. Chest x-ray showed mild bibasilar pulmonary infiltrates. Influenza was negative. Patient was started on vancomycin, cefepime, albuterol, ipratropium and given normal saline. He reports that he continues to feel the same without much improvement in his shortness of breath.  Meds: Current Facility-Administered Medications  Medication Dose Route Frequency Provider Last Rate Last Dose  . ceFEPIme (MAXIPIME) 2 g in dextrose 5 % 50 mL IVPB  2 g Intravenous Q8H [name] Gambler, MD      . vancomycin (VANCOCIN) IVPB 1000 mg/200 mL premix  1,000 mg Intravenous Q8H [name] Gambler, MD       Current Outpatient Prescriptions  Medication Sig Dispense Refill  . albuterol (PROVENTIL HFA;VENTOLIN HFA) 108 (90 Base) MCG/ACT inhaler Inhale 2 puffs into the lungs every 6 (six) hours as needed for wheezing or shortness of breath. 1 Inhaler 2    Allergies: Allergies as of 04/06/2016  . (No Known Allergies)   Past Medical History:  Diagnosis Date  . COPD (chronic obstructive pulmonary disease) ([place])   . Pneumothorax    2016, fell from ladder    Family History:  Father: COPD  Social History:  Tobacco Use: Previous smoker with a 80-pack-year history, quit 3 years ago Alcohol Use: Drinks 40 ounces of beer per night Illicit Drug Use: Admits to marijuana use. 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   Review of Systems: A complete ROS  was reviewed and negative except as per HPI.   Physical Exam: Blood pressure (!) 148/80, pulse (!) 111, temperature (!) 101.5 F (38.6 C), temperature source Rectal, resp. rate (!) 21, height 5' 9.5" (1.765 m), weight 160 lb (72.6 kg), SpO2 93 %. General: Vital signs reviewed.  Patient is chronically ill appearing, in mild acute distress and cooperative with exam.  Eyes: PERRL, conjunctivae normal Ears, Nose, Throat, and Mouth: Normal  bilateral nasal turbinates. Normal posterior oropharynx. Moist mucous membranes.  Cardiovascular: Tachycardic, regular rhythm,  no murmurs rubs or gallops. No JVD or carotid bruit present. No lower extremity edema bilaterally. Bilateral radial and pedal pulses are intact and symmetric bilaterally.  Pulmonary: Decreased breath sounds bilaterally, mild inspiratory crackles in bilateral lower lung fields. No wheezes. No accessory muscle use. + Clubbing in the fingers Gastrointestinal: Soft, non-tender, non-distended, BS +, no guarding present.  Neurologic: Awake, alert, oriented. Moving all extremities.  Skin: Warm, dry and intact. No rashes or erythema. Psychiatric: Normal mood and affect. speech and behavior is normal. Cognition and memory are normal.   EKG: Sinus tachycardia  CXR: I independently reviewed these films. Bilateral pulmonary infiltrates.  Assessment & Plan by Problem: Principal Problem:   Pneumonia Active Problems:   COPD exacerbation ([place])   History of tobacco abuse   Alcohol abuse  James [name] is a 56 year old male with past medical history of tobacco abuse, history of traumatic hemothorax, and likely COPD who presents to the emergency department with complaint of shortness of breath.  COPD exacerbation 2/2 pneumonia: Patient presents with recurrent shortness of breath, nonproductive cough, fever and chills. Patient is febrile on exam to 101.5 with increased leukocytosis to 23.3 with left shift. Chest x-ray with mild bibasilar pulmonary infiltrates. Influenza negative. Lactic acid normal. Given recent admission for COPD exacerbation and clinical presentation, patient is likely experiencing a recurrent COPD exacerbation with possible underlying pneumonia. It is unclear why he continues to have these exacerbations and illnesses, possibly due to poorly controlled COPD as outpatient. Patient was started on vancomycin and cefepime in the emergency department given recent  hospitalization. I think it is reasonable to continue antibiotics for pneumonia and also treat for COPD exacerbation. -Admit to inpatient -Continue vancomycin and cefepime -DuoNeb every 6 hours -Albuterol every 2 hours when necessary -Prednisone 40 mg for 5 days -Respiratory viral panel -Blood cultures pending -Obtain 2 view chest x-ray tomorrow morning -We'll need to obtain pulmonary function testing once patient improved either inpatient versus outpatient -Consider discharging patient with additional inhalers for COPD other than just albuterol  Elevated liver enzymes: Liver enzymes are mildly and chronically elevated. No previous imaging in our system of the abdomen or liver. Likely secondary to chronic alcohol use. Would consider outpatient right upper quadrant ultrasound for evaluation of hepatic steatosis versus cirrhosis. HIV negative.  -Consider outpatient right upper quadrant ultrasound -Check hepatitis C antibody  Alcohol abuse: Patient reports drinking 40 ounces appear per night. He denies history of withdrawals or withdrawal seizures. -CIWA  Loose stools: Patient reports 2 episodes of loose stools over the last 2 days. I doubt C. difficile at this time. -Monitor  DVT/PE ppx: Lovenox SQ QD CODE: FULL FEN: Regular  Dispo: Admit patient to Inpatient with expected length of stay greater than 2 midnights.  Signed: Martyn Malay, DO PGY-3 Internal Medicine Resident Pager # [phone] 04/06/2016 12:42 PM

## 2016-04-06 NOTE — Progress Notes (Addendum)
Pharmacy Antibiotic Note  James [name] is a 56 y.o. male admitted on 04/06/2016 with pneumonia. Patient we recent hospitalization for pneumonia now with fever and leukocytosis. Pharmacy has been consulted for vancomcyin and cefepime dosing.  Plan: Vancomycin '1000mg'$  IV every 8 hours.  Goal trough 15-20 mcg/mL.  Cefepime 2G IV q8  F/U LOT and culture sensitivities  Height: 5' 9.5" (176.5 cm) Weight: 160 lb (72.6 kg) IBW/kg (Calculated) : 71.85  Temp (24hrs), Avg:99.9 F (37.7 C), Min:98.3 F (36.8 C), Max:101.5 F (38.6 C)   Recent Labs Lab 04/06/16 0901 04/06/16 0918  WBC 23.3*  --   CREATININE 0.80  --   LATICACIDVEN  --  1.68    Estimated Creatinine Clearance: 107.4 mL/min (by C-G formula based on SCr of 0.8 mg/dL).    No Known Allergies  Thank you for allowing pharmacy to be a part of this patient's care.  Jodean [name] Beth Spackman 04/06/2016 10:50 AM

## 2016-04-06 NOTE — ED Provider Notes (Signed)
[facility] DEPT Provider Note   CSN: [id number] Arrival date & time: 04/06/16  5573     History   Chief Complaint Chief Complaint  Patient presents with  . Shortness of Breath    HPI James [name] is a 56 y.o. male.  HPI  56 year old male presents with recurrent dyspnea. He was discharged on 3/25 with pneumonia/COPD exacerbation. He states he is not a current smoker. He finished one of the medications he was prescribed, not sure if it was the antibiotic or steroid 2 days ago. Since then he has been worsening. This morning he was even worse than yesterday. He did not know he had a fever but EMS measured a temperature 102.7. They gave him 1 g of Tylenol by mouth. Has been having chest tightness and shortness of breath. Cough has not significantly changed. No leg swelling.  Past Medical History:  Diagnosis Date  . COPD (chronic obstructive pulmonary disease) ([place])   . Pneumothorax    2016, fell from ladder    Patient Active Problem List   Diagnosis Date Noted  . Pneumonia 04/06/2016  . History of tobacco abuse 04/06/2016  . Alcohol abuse 04/06/2016  . Elevated liver enzymes 04/06/2016  . HCAP (healthcare-associated pneumonia)   . COPD exacerbation ([place]) 03/26/2016    Past Surgical History:  Procedure Laterality Date  . VIDEO ASSISTED THORACOSCOPY (VATS)/THOROCOTOMY Left 06/11/2013   Procedure: VIDEO ASSISTED THORACOSCOPY (VATS)/THOROCOTOMY;  Surgeon: Ivin Poot, MD;  Location: [facility];  Service: Thoracic;  Laterality: Left;  [name] VIDEO BRONCHOSCOPY N/A 06/11/2013   Procedure: VIDEO BRONCHOSCOPY;  Surgeon: Ivin Poot, MD;  Location: [facility] OR;  Service: Thoracic;  Laterality: N/A;       Home Medications    Prior to Admission medications   Medication Sig Start Date End Date Taking? Authorizing Provider  albuterol (PROVENTIL HFA;VENTOLIN HFA) 108 (90 Base) MCG/ACT inhaler Inhale 2 puffs into the lungs every 6 (six) hours as needed for wheezing or shortness of breath.  03/28/16   Shela Leff, MD    Family History Family History  Problem Relation Age of Onset  . Heart disease Father   . COPD Sister     Social History Social History  Substance Use Topics  . Smoking status: Former Smoker    Packs/day: 1.50    Years: 40.00    Types: Cigarettes  . Smokeless tobacco: Current User    Types: Chew     Comment: 1 can daily for 3 years  . Alcohol use 1.8 oz/week    3 Cans of beer per week     Allergies   Patient has no known allergies.   Review of Systems Review of Systems  Constitutional: Positive for fever (per EMS).  Respiratory: Positive for cough, chest tightness, shortness of breath and wheezing.   Cardiovascular: Negative for leg swelling.  All other systems reviewed and are negative.    Physical Exam Updated Vital Signs BP (!) 143/74   Pulse (!) 106   Temp (!) 101.5 F (38.6 C) (Rectal)   Resp (!) 24   Ht 5' 9.5" (1.765 m)   Wt 160 lb (72.6 kg)   SpO2 95%   BMI 23.29 kg/m   Physical Exam  Constitutional: He is oriented to person, place, and time. He appears well-developed and well-nourished. He appears distressed.  HENT:  Head: Normocephalic and atraumatic.  Right Ear: External ear normal.  Left Ear: External ear normal.  Nose: Nose normal.  Eyes: Right eye exhibits no discharge.  Left eye exhibits no discharge.  Neck: Neck supple.  Cardiovascular: Regular rhythm and normal heart sounds.  Tachycardia present.   Pulmonary/Chest: Tachypnea noted. He has decreased breath sounds (mild). He has wheezes (diffuse, expiratory).  Speaks in short sentences  Abdominal: Soft. There is no tenderness.  Musculoskeletal: He exhibits no edema.  Neurological: He is alert and oriented to person, place, and time.  Skin: Skin is warm and dry. He is not diaphoretic.  Nursing note and vitals reviewed.    ED Treatments / Results  Labs (all labs ordered are listed, but only abnormal results are displayed) Labs Reviewed    COMPREHENSIVE METABOLIC PANEL - Abnormal; Notable for the following:       Result Value   Glucose, Bld 161 (*)    ALT 69 (*)    All other components within normal limits  CBC WITH DIFFERENTIAL/PLATELET - Abnormal; Notable for the following:    WBC 23.3 (*)    Neutro Abs 19.2 (*)    Monocytes Absolute 1.4 (*)    All other components within normal limits  URINALYSIS, ROUTINE W REFLEX MICROSCOPIC - Abnormal; Notable for the following:    Glucose, UA >=500 (*)    All other components within normal limits  CULTURE, BLOOD (ROUTINE X 2)  CULTURE, BLOOD (ROUTINE X 2)  INFLUENZA PANEL BY PCR (TYPE A & B)  I-STAT CG4 LACTIC ACID, ED  I-STAT CG4 LACTIC ACID, ED    EKG  EKG Interpretation  Date/Time:  Tuesday April 06 2016 08:49:13 EDT Ventricular Rate:  143 PR Interval:    QRS Duration: 87 QT Interval:  279 QTC Calculation: 431 R Axis:   48 Text Interpretation:  Sinus tachycardia Anteroseptal infarct, old no significant change since Mar 26 2016 Confirmed by Regenia Skeeter MD, Trew Sunde [phone]) on 04/06/2016 8:58:12 AM       Radiology Dg Chest Port 1 View  Result Date: 04/06/2016 CLINICAL DATA:  Shortness of breath.  Fever. EXAM: PORTABLE CHEST 1 VIEW COMPARISON:  CT 03/26/2016.  Chest x-ray 03/27/2016. FINDINGS: Mediastinum and hilar structures are normal. Heart size normal. Mild bibasilar pulmonary infiltrates are noted. No pleural effusion or pneumothorax. Right costophrenic angle not imaged. Old right rib fractures. IMPRESSION: Mild bibasilar pulmonary infiltrates. Electronically Signed   By: Marcello Moores  Register   On: 04/06/2016 09:22    Procedures Procedures (including critical care time)  Medications Ordered in ED Medications  vancomycin (VANCOCIN) IVPB 1000 mg/200 mL premix (not administered)  ceFEPIme (MAXIPIME) 2 g in dextrose 5 % 50 mL IVPB (not administered)  albuterol (PROVENTIL HFA;VENTOLIN HFA) 108 (90 Base) MCG/ACT inhaler 2 puff (2 puffs Inhalation Given 04/06/16 1442)   ipratropium-albuterol (DUONEB) 0.5-2.5 (3) MG/3ML nebulizer solution 3 mL (3 mLs Nebulization Given 04/06/16 1519)  albuterol (PROVENTIL,VENTOLIN) solution continuous neb (15 mg/hr Nebulization Given 04/06/16 0904)  magnesium sulfate IVPB 2 g 50 mL (0 g Intravenous Stopped 04/06/16 1035)  ipratropium (ATROVENT) nebulizer solution 0.5 mg (0.5 mg Nebulization Given 04/06/16 0904)  sodium chloride 0.9 % bolus 1,000 mL (0 mLs Intravenous Stopped 04/06/16 1035)  vancomycin (VANCOCIN) IVPB 1000 mg/200 mL premix (0 mg Intravenous Stopped 04/06/16 1120)  ceFEPIme (MAXIPIME) 2 g in dextrose 5 % 50 mL IVPB (0 g Intravenous Stopped 04/06/16 1120)     Initial Impression / Assessment and Plan / ED Course  I have reviewed the triage vital signs and the nursing notes.  Pertinent labs & imaging results that were available during my care of the patient were reviewed by me and  considered in my medical decision making (see chart for details).  Clinical Course as of Apr 07 1542  Tue Apr 06, 2016  3668 Patient is currently afebrile, 102.7 by EMS. Will start on sepsis protocol, screen for influenza and/or pneumonia. Continuous albuterol given he is still wheezy and short of breath despite 10 mg albuterol already.  [SG]    Clinical Course User Index [SG] [name] Gambler, MD    Patient has improved but still tachypneic. However he is feeling better and his heart rate has improved. Heart rate was likely related to both respiratory distress and the fever. However he is improving. Treated with hospital-acquired antibiotics given recent mission and what appears to be new infiltrates. Stable for telemetry admission to the internal medicine teaching service.   Final Clinical Impressions(s) / ED Diagnoses   Final diagnoses:  HCAP (healthcare-associated pneumonia)  COPD exacerbation ([place])    New Prescriptions New Prescriptions   No medications on file     [name] Gambler, MD 04/06/16 1546

## 2016-04-06 NOTE — ED Notes (Signed)
Pt stated he was unable to give a urine sample at this time.

## 2016-04-06 NOTE — ED Notes (Signed)
Pt found to be chewing tobacco in room. Agreeable to stop. MD notified.

## 2016-04-06 NOTE — ED Triage Notes (Signed)
Per [facility], pt from home, was admitted for pneumonia two weeks ago, pt states he fnished his steroid and antibiotic and this morning woke up feeling short of breath, took inhaler without relief. Pt febrile per ems, HR 140s, given 1 gram tylenol, 125 solumedrol, 10 albuteral and .5 atrovent. Pt labored breathing. MD notified.

## 2016-04-07 ENCOUNTER — Inpatient Hospital Stay (HOSPITAL_COMMUNITY): Payer: Self-pay

## 2016-04-07 LAB — BASIC METABOLIC PANEL
Anion gap: 10 (ref 5–15)
BUN: 16 mg/dL (ref 6–20)
CALCIUM: 8.7 mg/dL — AB (ref 8.9–10.3)
CO2: 24 mmol/L (ref 22–32)
Chloride: 106 mmol/L (ref 101–111)
Creatinine, Ser: 0.67 mg/dL (ref 0.61–1.24)
GFR calc non Af Amer: >60 mL/min (ref >60)
GLUCOSE: 116 mg/dL — AB (ref 65–99)
Potassium: 4.5 mmol/L (ref 3.5–5.1)
Sodium: 140 mmol/L (ref 135–145)

## 2016-04-07 LAB — RESPIRATORY PANEL BY PCR
ADENOVIRUS-RVPPCR: NOT DETECTED
Bordetella pertussis: NOT DETECTED
CORONAVIRUS 229E-RVPPCR: NOT DETECTED
CORONAVIRUS NL63-RVPPCR: NOT DETECTED
CORONAVIRUS OC43-RVPPCR: NOT DETECTED
Chlamydophila pneumoniae: NOT DETECTED
Coronavirus HKU1: NOT DETECTED
Influenza A: NOT DETECTED
Influenza B: NOT DETECTED
METAPNEUMOVIRUS-RVPPCR: NOT DETECTED
MYCOPLASMA PNEUMONIAE-RVPPCR: NOT DETECTED
PARAINFLUENZA VIRUS 1-RVPPCR: NOT DETECTED
PARAINFLUENZA VIRUS 2-RVPPCR: NOT DETECTED
Parainfluenza Virus 3: NOT DETECTED
Parainfluenza Virus 4: NOT DETECTED
RESPIRATORY SYNCYTIAL VIRUS-RVPPCR: NOT DETECTED
Rhinovirus / Enterovirus: NOT DETECTED

## 2016-04-07 LAB — CBC
HEMATOCRIT: 41.2 % (ref 39.0–52.0)
Hemoglobin: 13.5 g/dL (ref 13.0–17.0)
MCH: 32.1 pg (ref 26.0–34.0)
MCHC: 32.8 g/dL (ref 30.0–36.0)
MCV: 97.9 fL (ref 78.0–100.0)
Platelets: 206 10*3/uL (ref 150–400)
RBC: 4.21 MIL/uL — ABNORMAL LOW (ref 4.22–5.81)
RDW: 13 % (ref 11.5–15.5)
WBC: 25.2 10*3/uL — ABNORMAL HIGH (ref 4.0–10.5)

## 2016-04-07 LAB — GLUCOSE, CAPILLARY
Glucose-Capillary: 123 mg/dL — ABNORMAL HIGH (ref 65–99)
Glucose-Capillary: 135 mg/dL — ABNORMAL HIGH (ref 65–99)
Glucose-Capillary: 241 mg/dL — ABNORMAL HIGH (ref 65–99)
Glucose-Capillary: 151 mg/dL — ABNORMAL HIGH (ref 65–99)

## 2016-04-07 LAB — MRSA PCR SCREENING: MRSA by PCR: NEGATIVE

## 2016-04-07 MED ORDER — IPRATROPIUM-ALBUTEROL 0.5-2.5 (3) MG/3ML IN SOLN
3.0000 mL | Freq: Three times a day (TID) | RESPIRATORY_TRACT | Status: DC
  Administered 2016-04-07 – 2016-04-08 (×3): 3 mL via RESPIRATORY_TRACT
  Filled 2016-04-07 (×3): qty 3

## 2016-04-07 NOTE — Progress Notes (Signed)
   Subjective:  Patient continues to have cough and some SOB but feels his breathing has improved since admission. No events overnight.   Objective:  Vital signs in last 24 hours: Vitals:   04/07/16 0010 04/07/16 0058 04/07/16 0637 04/07/16 0800  BP:  129/79 135/74   Pulse: 83 88 93   Resp:  17 18   Temp:  97.6 F (36.4 C) 98.3 F (36.8 C)   TempSrc:  Oral    SpO2:  94% 96% 94%  Weight:      Height:       Physical Exam Constitutional: NAD, appears comfortable Cardiovascular: RRR, no murmurs, rubs, or gallops.  Pulmonary/Chest: Mild bibasilar crackles, some decreased breath sounds at the bases bilaterally, no wheezing. + clubbing of the fingers.  Abdominal: Soft, non tender, non distended. +BS.  Extremities: Warm and well perfused. No edema.  Neurological: A&Ox3, CN II - XII grossly intact.  Skin: No rashes or erythema  Psychiatric: Normal mood and affect   Assessment/Plan:  James [name] is a 56 year old male with past medical history of tobacco abuse, history of traumatic hemothorax, and likely COPD who presents to the emergency department with complaint of shortness of breath.  COPD exacerbation 2/2 pneumonia: No formal COPD diagnosis (no PFTs recorded) but patient was recently discharged after a presumed COPD exacerbation and CTA chest at that time showed bilateral emphysematous changes with superimposed bronchopneumonia. He has an 80 pack year history, quit smoking 3 years ago. He completed his course of antibiotics and prednisone this past Sunday. On Monday, he developed recurrent shortness of breath, nonproductive cough, fevers, and chills. On admission he was noted to be febrile to 101.5 with leukocytosis of 23. Chest x-ray revealed mild bibasilar pulmonary infiltrates. He was started on vancomycin and cefepime in the ED for HCAP coverage. Patient reports his breathing has improved today with antibiotics, prednisone, and breathing treatments. However, repeat chest x-ray this  morning shows worsening interstitial infiltrates bilaterally and trace left pleural effusion. Leukocytosis has persisted, 23 -> 25. This may be partially due to steroid treatment. Low suspicion for MRSA pneumonia at this time and nasal swab was negative. Will discontinue vancomycin. Continue cefepime today, transition to by mouth antibiotics if clinically improved tomorrow. -- Discontinue vancomycin -- Continue cefepime (day 2)  - DuoNeb every 6 hours - Albuterol every 2 hours when necessary - Prednisone 40 mg for (day 2/5) - Respiratory viral panel pending  -- Influenza panel negative  - Blood cultures pending - We'll need to obtain pulmonary function testing once patient improved either inpatient versus outpatient - Will consider discharging patient with additional inhalers for COPD other than just albuterol  Elevated liver enzymes: Liver enzymes are mildly and chronically elevated. No previous imaging in our system of the abdomen or liver. Likely secondary to chronic alcohol use. Would consider outpatient right upper quadrant ultrasound for evaluation of hepatic steatosis versus cirrhosis. HIV negative.  -Consider outpatient right upper quadrant ultrasound -Check hepatitis C antibody  Alcohol abuse: Patient reports drinking 40 ounces appear per night. He denies history of withdrawals or withdrawal seizures. -CIWA  Loose stools: Patient reports 2 episodes of loose stools over the last 2 days. I doubt C. difficile at this time. -Monitor  DVT/PE ppx: Lovenox SQ QD CODE: FULL FEN: Regular  Dispo: Anticipated discharge in approximately 1-2 day(s).   James Ochs, MD 04/07/2016, 11:35 AM Pager: [phone]

## 2016-04-08 ENCOUNTER — Telehealth: Payer: Self-pay

## 2016-04-08 ENCOUNTER — Other Ambulatory Visit: Payer: Self-pay | Admitting: Internal Medicine

## 2016-04-08 ENCOUNTER — Telehealth: Payer: Self-pay | Admitting: Internal Medicine

## 2016-04-08 DIAGNOSIS — J189 Pneumonia, unspecified organism: Secondary | ICD-10-CM

## 2016-04-08 DIAGNOSIS — R740 Nonspecific elevation of levels of transaminase and lactic acid dehydrogenase [LDH]: Secondary | ICD-10-CM

## 2016-04-08 DIAGNOSIS — R768 Other specified abnormal immunological findings in serum: Secondary | ICD-10-CM

## 2016-04-08 DIAGNOSIS — J441 Chronic obstructive pulmonary disease with (acute) exacerbation: Principal | ICD-10-CM

## 2016-04-08 DIAGNOSIS — Z87891 Personal history of nicotine dependence: Secondary | ICD-10-CM

## 2016-04-08 DIAGNOSIS — B1920 Unspecified viral hepatitis C without hepatic coma: Secondary | ICD-10-CM

## 2016-04-08 DIAGNOSIS — F1020 Alcohol dependence, uncomplicated: Secondary | ICD-10-CM

## 2016-04-08 DIAGNOSIS — R197 Diarrhea, unspecified: Secondary | ICD-10-CM

## 2016-04-08 LAB — CBC
HCT: 43.1 % (ref 39.0–52.0)
Hemoglobin: 14.2 g/dL (ref 13.0–17.0)
MCH: 32.3 pg (ref 26.0–34.0)
MCHC: 32.9 g/dL (ref 30.0–36.0)
MCV: 98 fL (ref 78.0–100.0)
PLATELETS: 233 10*3/uL (ref 150–400)
RBC: 4.4 MIL/uL (ref 4.22–5.81)
RDW: 13.3 % (ref 11.5–15.5)
WBC: 16.5 10*3/uL — ABNORMAL HIGH (ref 4.0–10.5)

## 2016-04-08 LAB — HEPATITIS C ANTIBODY: HCV Ab: >11 s/co ratio — ABNORMAL HIGH (ref 0.0–0.9)

## 2016-04-08 LAB — GLUCOSE, CAPILLARY
GLUCOSE-CAPILLARY: 116 mg/dL — AB (ref 65–99)
GLUCOSE-CAPILLARY: 173 mg/dL — AB (ref 65–99)

## 2016-04-08 MED ORDER — TIOTROPIUM BROMIDE MONOHYDRATE 1.25 MCG/ACT IN AERS: 2.0000 | INHALATION_SPRAY | Freq: Every day | RESPIRATORY_TRACT | 2 refills | Status: DC

## 2016-04-08 MED ORDER — IPRATROPIUM-ALBUTEROL 0.5-2.5 (3) MG/3ML IN SOLN: 3.0000 mL | Freq: Two times a day (BID) | RESPIRATORY_TRACT | Status: DC

## 2016-04-08 MED ORDER — AZITHROMYCIN 250 MG PO TABS: ORAL_TABLET | ORAL | 0 refills | Status: DC

## 2016-04-08 MED ORDER — PREDNISONE 20 MG PO TABS: 40.0000 mg | ORAL_TABLET | Freq: Every day | ORAL | 0 refills | Status: DC

## 2016-04-08 NOTE — Telephone Encounter (Signed)
   Reason for call:   I received a call from Mr. Lillard Anes Komperda's family member at 5:00 PM indicating that one of his new discharge medications is too expensive.   Pertinent Data:   Patient admitted for COPD exacerbation and discharged in good condition 4/5. Spiriva was prescribed on discharge, however family reports this cost $400 which is understandably unaffordable.   Assessment / Plan / Recommendations:   Patient has not been on maintenance inhalers in the past. He will follow up in [facility] in 1 week at which time more affordable options should be discussed. Should also consider outpatient PFTs to better characterize his lung disease. He will try to move his appointment up if he feels his prior to admission symptoms are reoccurring.  As always, pt is advised that if symptoms worsen or new symptoms arise, they should go to an urgent care facility or to to ER for further evaluation.   Zada Finders, MD   04/08/2016, 8:02 PM

## 2016-04-08 NOTE — Progress Notes (Signed)
   Subjective: Patient is doing well this morning. He reports improvement in his cough and shortness of breath. Patient reports he feels ready for discharge.  Objective:  Vital signs in last 24 hours: Vitals:   04/07/16 1926 04/07/16 2109 04/08/16 0527 04/08/16 0818  BP:  (!) 141/82 136/79   Pulse: 87 86 76   Resp: '18 20 18   '$ Temp:  98.2 F (36.8 C) 98.3 F (36.8 C)   TempSrc:  Oral Oral   SpO2: 95% 95% 96% 91%  Weight:      Height:       Physical Exam Constitutional: NAD, appears comfortable Cardiovascular: RRR, no murmurs, rubs, or gallops.  Pulmonary/Chest: CTAB, no wheezes, rales, or rhonchi. + clubbing of the fingers.  Abdominal: Soft, non tender, non distended. +BS.  Extremities: Warm and well perfused. No edema.  Neurological: A&Ox3, CN II - XII grossly intact.  Skin: No rashes or erythema  Psychiatric: Normal mood and affect  Assessment/Plan:  Mr. Cutler is a 56 year old male with past medical history of tobacco abuse, history of traumatic hemothorax, and likely COPD who presents to the emergency department with complaint of shortness of breath.  COPD exacerbation 2/2 pneumonia:No formal COPD diagnosis (no PFTs recorded) but patient was recently discharged after a presumed COPD exacerbation and CTA chest at that time showed bilateral emphysematous changes with superimposed bronchopneumonia. He has an 80 pack year history, quit smoking 3 years ago. He completed his course of antibiotics and prednisone this past Sunday. On Monday, he developed recurrent shortness of breath, nonproductive cough, fevers, and chills. On admission he was noted to be febrile to 101.5 with leukocytosis of 23. Chest x-ray revealed mild bibasilar pulmonary infiltrates. He has improved significantly with IV antibiotics, prednisone, and scheduled duonebs.  -- Day 3 of Cefepime; transition to azithromycin on discharge.  - DuoNeb every 6 hours; will start Spiriva on discharge for COPD maintenance  -  Albuterol every 2 hours prn  - Prednisone 40 mg for (day 3/5) - Respiratory viral panel pending >> negative  -- Influenza panel negative  - Blood cultures NG x 24 hours  - We'll need to obtain pulmonary function testing once patient improved either inpatient versus outpatient  Elevated liver enzymes: Liver enzymes are mildly and chronically elevated. No previous imaging in our system of the abdomen or liver. Likely secondary to chronic alcohol use. Would consider outpatient right upper quadrant ultrasound for evaluation of hepatic steatosis versus cirrhosis. HIV negative.  -Consider outpatient right upper quadrant ultrasound -Check hepatitis C antibody  Alcohol abuse: Patient reports drinking 40 ounces appear per night. He denies history of withdrawals or withdrawal seizures. -CIWA  Loose stools: Patient reports 2 episodes of loose stools over the last 2 days. I doubt C. difficile at this time. -Monitor  DVT/PE ppx: Lovenox SQ QD CODE: FULL FEN: Regular  Dispo: Anticipated discharge today.   Velna Ochs, MD 04/08/2016, 11:34 AM Pager: [phone]

## 2016-04-08 NOTE — Discharge Summary (Signed)
Name: James [name] MRN: [id number] DOB: 12/06/1960 56 y.o. PCP: No Pcp Per Patient  Date of Admission: 04/06/2016  8:42 AM Date of Discharge: 04/08/2016 Attending Physician: Annia Belt, MD  Discharge Diagnosis: 1. COPD exacerbation  2. Transaminitis  3. Hepatitis C antibody positive   Discharge Medications: Allergies as of 04/08/2016   No Known Allergies     Medication List    TAKE these medications   albuterol 108 (90 Base) MCG/ACT inhaler Commonly known as:  PROVENTIL HFA;VENTOLIN HFA Inhale 2 puffs into the lungs every 6 (six) hours as needed for wheezing or shortness of breath.   azithromycin 250 MG tablet Commonly known as:  ZITHROMAX Z-PAK Please take two tablets on day 1, then one tablet daily to complete the course   predniSONE 20 MG tablet Commonly known as:  DELTASONE Take 2 tablets (40 mg total) by mouth daily with breakfast.   Tiotropium Bromide Monohydrate 1.25 MCG/ACT Aers Commonly known as:  SPIRIVA RESPIMAT Inhale 2 puffs into the lungs daily.       Disposition and follow-up:   JamesJames [name] was discharged from [facility] in Stable condition.  At the hospital follow up visit please address:  1.  COPD Exacerbation: Patient has no formal COPD diagnosis (no PFTs on record) but this is his second hospitalization for presumed COPD exacerbation. He is a former smoker (quit 3 years ago) with a 80-pack-year history. He has emphysematous changes on CT chest. He was discharged with a prescription for azithromycin pack x 5 days, prednisone 40  3 days (to complete 5 day course), Spiriva inhaler for daily maintenance, and albuterol as needed. Patient would likely benefit from outpatient PFTs once recovered from acute illness.  2. Transaminitis: Liver enzymes are mildly and chronically elevated. Likely multifactorial due to alcohol use as well as hep C antibody positive. Patient will need hep C viral load and genotype checked at follow-up.  Would also consider right upper quadrant ultrasound to evaluate for hepatic C ptosis versus cirrhosis. HIV antibody was nonreactive.  3.  Labs / imaging needed at time of follow-up: Pulmonary function testing,  Hep C viral load   4.  Pending labs/ test needing follow-up: None   Follow-up Appointments: Follow-up Information    [place]. Go on 04/16/2016.   Why:  at 2:45 pm for hospital follow up. Our clinic is located on the ground floor of the hospital in the [place] wing past the cafeteria.  Please arrive 15 minutes early and bring all of your medications with you. Contact information: [address] [place] [facility] Course by problem list:  1. COPD Exacerbation: Patient is a 56 year old male past medical history of tobacco abuse (quit 3 years ago), traumatic hemothorax, and likely COPD who presented to the ED complaining of shortness of breath. He has no formal COPD diagnosis (no PFTs on file) but was recently admitted from 03/23/16 -  03/28/16 for a presumed COPD exacerbation. He has a former 80-pack-year smoking history. At that time, patient presented with shortness of breath, wheezing, dyspnea on exertion and fatigue. He was satting 80% on room air. CTA chest was negative for PE but showed bilateral emphysematous changes, hyperinflation, and potential bilateral atypical bronchopneumonia. He was treated with prednisone, azithromycin, DuoNeb nebs, and albuterol when necessary. He was discharged with plans for follow-up with community health and wellness however developed recurrent symptoms after  taking his last dose of prednisone the day prior. On presentation, he was noted to be febrile to 101.5 with leukocytosis of 23. CXR revealed mild bibasilar pulmonary infiltrates. He was started on vancomycin and cefepime in the ED for HCAP coverage. He was also treated with prednisone, DuoNeb's, and albuterol prn. Symptoms improved  significantly the following day. There is low suspicion for MRSA pneumonia (nasal swab was negative) and vancomycin was discontinued. He completed 3 days of IV cefepime and was transitioned to azithromycin on discharge for atypical coverage. He was also discharged on Spiriva for presumed COPD maintenance, a prescription to complete a 5 day course of prednisone 40 mg, and with a when necessary albuterol inhaler. Patient would likely benefit from outpatient PFTs once he is improved from his acute illness.  2. Transaminitis: Liver enzymes are mildly and chronically elevated. Likely multifactorial due to alcohol use as well as hep C antibody positive. Patient will need hep C viral load and genotype checked at follow-up. Would also consider right upper quadrant ultrasound to evaluate for hepatic C ptosis versus cirrhosis. HIV antibody was nonreactive.  Discharge Vitals:   BP 136/79 (BP Location: Left Arm)   Pulse 76   Temp 98.3 F (36.8 C) (Oral)   Resp 18   Ht 5' 9.5" (1.765 m)   Wt 172 lb 12.8 oz (78.4 kg)   SpO2 91%   BMI 25.15 kg/m   Pertinent Labs, Studies, and Procedures:   04/06/2016 CXR portable 1 view: IMPRESSION: Mild bibasilar pulmonary infiltrates.  04/07/2016 CXR 2 View:  IMPRESSION: COPD. Worsening of interstitial infiltrates bilaterally. Trace left pleural effusion.  Thoracic aortic atherosclerosis.  Discharge Instructions: Discharge Instructions    Call MD for:  difficulty breathing, headache or visual disturbances    Complete by:  As directed    Call MD for:  temperature >100.4    Complete by:  As directed    Diet - low sodium heart healthy    Complete by:  As directed    Discharge instructions    Complete by:  As directed    James [name],  It was a pleasure taking care of you. I have sent a prescription for azithromycin and prednisone to your pharmacy. Please take these as prescribed. You may continue using your albuterol inhaler as needed. I have started you on a  daily maintenance inhaler called Spiriva for your COPD. Please use the inhaler 2 puff daily in the morning. I have scheduled an appointment for you in our clinic next Friday, April 13th at 2:45pm for hospital follow up. Our clinic is located on the ground floor of the hospital in the [place] wing, past the cafeteria. If you have any questions or concerns, call our clinic at [phone] or after hours call [phone] and ask for the internal medicine resident on call. Thank you!  - Dr. Philipp Ovens   Increase activity slowly    Complete by:  As directed       Signed: Velna Ochs, MD 04/13/2016, 3:44 PM   Pager: [phone]

## 2016-04-08 NOTE — Telephone Encounter (Signed)
Hospital TOC per Dr Philipp Ovens, discharge 04/08/2016, appt 04/16/2016.

## 2016-04-11 LAB — CULTURE, BLOOD (ROUTINE X 2)
CULTURE: NO GROWTH
Culture: NO GROWTH
Special Requests: ADEQUATE
Special Requests: ADEQUATE

## 2016-04-15 ENCOUNTER — Telehealth: Payer: Self-pay | Admitting: Internal Medicine

## 2016-04-15 NOTE — Telephone Encounter (Signed)
Calling to confirm appointment for 04/16/16 at 2:45 no answer mail box full

## 2016-04-16 ENCOUNTER — Ambulatory Visit (INDEPENDENT_AMBULATORY_CARE_PROVIDER_SITE_OTHER): Payer: Self-pay | Admitting: Internal Medicine

## 2016-04-16 ENCOUNTER — Encounter: Payer: Self-pay | Admitting: Internal Medicine

## 2016-04-16 VITALS — BP 141/80 | HR 93 | Temp 99.5°F | Ht 69.0 in | Wt 176.9 lb

## 2016-04-16 DIAGNOSIS — M79604 Pain in right leg: Secondary | ICD-10-CM

## 2016-04-16 DIAGNOSIS — Z87891 Personal history of nicotine dependence: Secondary | ICD-10-CM

## 2016-04-16 DIAGNOSIS — M545 Low back pain: Secondary | ICD-10-CM

## 2016-04-16 DIAGNOSIS — J449 Chronic obstructive pulmonary disease, unspecified: Secondary | ICD-10-CM

## 2016-04-16 DIAGNOSIS — R768 Other specified abnormal immunological findings in serum: Secondary | ICD-10-CM

## 2016-04-16 DIAGNOSIS — Z5189 Encounter for other specified aftercare: Secondary | ICD-10-CM

## 2016-04-16 NOTE — Patient Instructions (Signed)
General Instructions: - Will check hepatitis lab work - I will call you with results - Use Spiriva 2 puffs daily - Only use albuterol if feeling symptoms such as shortness of breath, wheezing  Thank you for bringing your medicines today. This helps [place] keep you safe from mistakes.   Progress Toward Treatment Goals:  No flowsheet data found.  Self Care Goals & Plans:  No flowsheet data found.  No flowsheet data found.   Care Management & Community Referrals:  No flowsheet data found.

## 2016-04-16 NOTE — Progress Notes (Signed)
   CC: Hospital follow up for COPD exacerbation  HPI:  [name] is a 56 y.o. man with PMHx as noted below who presents today for hospital follow up after a presumed COPD exacerbation.  COPD: Patient was hospitalized from 4/3-4/5 after presenting with SOB, wheezing, and DOE. This was presumed to be a COPD exacerbation given his extensive smoking history. He has never had PFTs before. He quit smoking 3 years ago. Today, he reports still getting SOB about once per day that is relieved by his albuterol inhaler. He was unable to afford the Spiriva inhaler that was prescribed as he is uninsured and it would cost him $400. He did complete the azithromycin and prednisone courses that were prescribed. He also notes still having a mild cough productive of white sputum. He denies any fevers or wheezing.   Positive Hep C Antibody: Found to be Hep C antibody positive in the hospital. Patient denies any hx of hepatitis C. He denies any hx of IVDU and unprotected sex.   Low Back Pain: Reports a 2-3 week hx of low back pain after working at his home. He describes a sharp pain that radiates from his lower back down both of his legs. Pain is 9/10 in severity. He denies any trauma, falls, or heavy lifting. He denies any difficulty walking. He denies any weakness in his lower extremities, numbness/tingling, or saddle anesthesia. He has not tried any OTC medications.  Past Medical History:  Diagnosis Date  . COPD (chronic obstructive pulmonary disease) ([place])   . Pneumothorax    2016, fell from ladder    Review of Systems:   General: Denies night sweats, changes in weight, changes in appetite HEENT: Denies headaches, ear pain, changes in vision, rhinorrhea, sore throat CV: Denies CP, palpitations, orthopnea Pulm: See HPI GI: Denies abdominal pain, nausea, vomiting, diarrhea, constipation, melena, hematochezia GU: Denies dysuria, hematuria, frequency Msk: Denies muscle cramps, joint pains Neuro: See  HPI Skin: Denies rashes, bruising Psych: Denies depression, anxiety, hallucinations  Physical Exam:  Vitals:   04/16/16 1515  BP: (!) 141/80  Pulse: 93  Temp: 99.5 F (37.5 C)  TempSrc: Oral  SpO2: 96%  Weight: 176 lb 14.4 oz (80.2 kg)  Height: '5\' 9"'$  (1.753 m)   General: Well-nourished man in NAD CV: RRR, no m/g/r Pulm: CTA bilaterally, breaths non-labored Ext: warm, no peripheral edema, full ROM of extremities Neuro: alert and oriented x 3. Gait normal. Strength 5/5 in upper and lower extremities.   Assessment & Plan:   See Encounters Tab for problem based charting.  Patient discussed with Dr. Angelia Mould

## 2016-04-17 LAB — HEPATIC FUNCTION PANEL
ALBUMIN: 4 g/dL (ref 3.5–5.5)
ALK PHOS: 46 IU/L (ref 39–117)
ALT: 59 IU/L — ABNORMAL HIGH (ref 0–44)
AST: 32 IU/L (ref 0–40)
BILIRUBIN TOTAL: 0.3 mg/dL (ref 0.0–1.2)
Bilirubin, Direct: 0.12 mg/dL (ref 0.00–0.40)
TOTAL PROTEIN: 6.1 g/dL (ref 6.0–8.5)

## 2016-04-18 DIAGNOSIS — M79604 Pain in right leg: Secondary | ICD-10-CM | POA: Insufficient documentation

## 2016-04-18 DIAGNOSIS — M545 Low back pain, unspecified: Secondary | ICD-10-CM | POA: Insufficient documentation

## 2016-04-18 DIAGNOSIS — M79605 Pain in left leg: Secondary | ICD-10-CM | POA: Insufficient documentation

## 2016-04-18 LAB — HCV RNA QUANT
HCV LOG10: 6.286 {Log_IU}/mL
HEPATITIS C QUANTITATION: 1930000 [IU]/mL

## 2016-04-18 NOTE — Assessment & Plan Note (Signed)
Noted to be Hep C antibody positive in the hospital. Will check a Hep C RNA to evaluate if he has active infection or was just exposed. Patient was educated on the results of the positive antibody test and we discussed the potential need for treatment if RNA level is elevated. He was counseled on abstaining from alcohol.   Update: RNA titer > 1.9 million copies. Will have genotype ordered. Will call patient on Mon 1/16 to inform him of results. Will need to be referred to ID for treatment.

## 2016-04-18 NOTE — Assessment & Plan Note (Signed)
Presumed diagnosis of COPD given his smoking history and emphysematous changes on CT chest. Gave him a sample Spiriva inhaler as he was unable to afford the one prescribed on hospital discharge. Also gave him the phone number for the MAP program at the health department so that he can get his medications for free. Advised to follow up in 1 month to reassess his symptoms and will likely need another Spiriva sample inhaler until he can be set up with MAP program.

## 2016-04-18 NOTE — Assessment & Plan Note (Signed)
Patient with a 2-3 week hx of low back pain radiating to both legs. Advised conservative treatment at this time given its acuity. No warning signs. Recommended using heating pads and ibuprofen as needed.

## 2016-04-19 ENCOUNTER — Telehealth: Payer: Self-pay | Admitting: Internal Medicine

## 2016-04-19 ENCOUNTER — Other Ambulatory Visit: Payer: Self-pay | Admitting: Internal Medicine

## 2016-04-19 DIAGNOSIS — R768 Other specified abnormal immunological findings in serum: Secondary | ICD-10-CM

## 2016-04-19 NOTE — Addendum Note (Signed)
Addended by: Juliet Rude on: 04/19/2016 08:49 AM   Modules accepted: Orders

## 2016-04-19 NOTE — Telephone Encounter (Signed)
Tried calling patient but no answer. Will attempt to call again later.

## 2016-04-20 NOTE — Progress Notes (Signed)
Internal Medicine Clinic Attending  Case discussed with Dr. Rivet at the time of the visit.  We reviewed the resident's history and exam and pertinent patient test results.  I agree with the assessment, diagnosis, and plan of care documented in the resident's note.  

## 2016-04-23 NOTE — Telephone Encounter (Signed)
Discussed lab results with patient's emergency contact, [name], as patient had informed her of initial positive hepatitis C antibody. Joycie Peek that we will make referral to infectious disease and that I will have them contact her to make an appointment. She will help patient get to [facility] appt on 5/14. She states he is having trouble using the inhalers, does not understand how to use them. He will need to be seen by Dr. Henderson Cloud team to learn how to use his inhalers.

## 2016-05-04 ENCOUNTER — Other Ambulatory Visit (HOSPITAL_COMMUNITY): Payer: Self-pay | Admitting: Internal Medicine

## 2016-05-04 DIAGNOSIS — B182 Chronic viral hepatitis C: Secondary | ICD-10-CM

## 2016-05-04 DIAGNOSIS — K7460 Unspecified cirrhosis of liver: Principal | ICD-10-CM

## 2016-05-04 LAB — HEPATITIS C GENOTYPE: Hepatitis C Genotype: 3

## 2016-05-04 LAB — SPECIMEN STATUS REPORT

## 2016-05-17 ENCOUNTER — Ambulatory Visit (INDEPENDENT_AMBULATORY_CARE_PROVIDER_SITE_OTHER): Payer: Self-pay | Admitting: Internal Medicine

## 2016-05-17 ENCOUNTER — Ambulatory Visit: Payer: Self-pay | Admitting: Pharmacist

## 2016-05-17 VITALS — BP 144/86 | HR 100 | Temp 98.2°F | Ht 69.0 in | Wt 175.0 lb

## 2016-05-17 DIAGNOSIS — R768 Other specified abnormal immunological findings in serum: Secondary | ICD-10-CM

## 2016-05-17 DIAGNOSIS — I10 Essential (primary) hypertension: Secondary | ICD-10-CM | POA: Insufficient documentation

## 2016-05-17 DIAGNOSIS — Z7951 Long term (current) use of inhaled steroids: Secondary | ICD-10-CM

## 2016-05-17 DIAGNOSIS — J449 Chronic obstructive pulmonary disease, unspecified: Secondary | ICD-10-CM

## 2016-05-17 DIAGNOSIS — F17220 Nicotine dependence, chewing tobacco, uncomplicated: Secondary | ICD-10-CM

## 2016-05-17 MED ORDER — HYDROCHLOROTHIAZIDE 25 MG PO TABS: 25.0000 mg | ORAL_TABLET | Freq: Every day | ORAL | 2 refills | Status: DC

## 2016-05-17 MED ORDER — HYDROCHLOROTHIAZIDE 25 MG PO TABS
25.0000 mg | ORAL_TABLET | Freq: Every day | ORAL | 2 refills | Status: DC
Start: 2016-05-17 — End: 2017-07-11

## 2016-05-17 NOTE — Progress Notes (Signed)
   CC: COPD follow up   HPI:  [name] is a 56 y.o. male with past medical history outlined below here for COPD follow up. For the details of today's visit, please refer to the assessment and plan.  Past Medical History:  Diagnosis Date  . COPD (chronic obstructive pulmonary disease) ([place])   . Pneumothorax    2016, fell from ladder    Review of Systems:  All pertinents listed in HPI, otherwise negative  Physical Exam:  Vitals:   05/17/16 1423 05/17/16 1449  BP: (!) 144/85 (!) 144/86  Pulse: (!) 105 100  Temp: 98.2 F (36.8 C)   TempSrc: Oral   SpO2: 96%   Weight: 175 lb (79.4 kg)   Height: '5\' 9"'$  (1.753 m)     Constitutional: NAD, appears comfortable Cardiovascular: RRR, no murmurs, rubs, or gallops.  Pulmonary/Chest: CTAB, no wheezes, rales, or rhonchi.  Extremities: Warm and well perfused.  No edema.  Neurological: A&Ox3, CN II - XII grossly intact.  Skin: No rashes or erythema  Psychiatric: Normal mood and affect  Assessment & Plan:   See Encounters Tab for problem based charting.  Patient discussed with Dr. Angelia Mould

## 2016-05-17 NOTE — Assessment & Plan Note (Signed)
Blood pressure is elevated today 144/85 and persistently elevated on recheck, 144/86. On chart review, it appears he has been elevated at past visits as well. Will start HCTZ on [facility] $4 list.  -- HCTZ 25 mg daily -- F/u 1 month

## 2016-05-17 NOTE — Assessment & Plan Note (Signed)
Patient reports significant improvement in his symptoms since starting Spiriva. Unfortunately, his inhaler broke a few days ago and he has been unable to use it. After staring Spiriva, he has only been requiring his PRN albuterol inhaler 1-2 times weekly. Once patient is approved for the orange card, we plan to order PFTs.  -- Continue Spiriva 2 puffs daily, provided sample today in clinic -- Continue Albuterol prn  -- PFTs once approved for the orange card

## 2016-05-17 NOTE — Patient Instructions (Addendum)
James [name],  It was a pleasure seeing you today. Please continue to use your Spiriva inhaler 2 puffs daily. I have started you on a blood pressure medication called hydrochlorothiazide. Please take this daily. I would like for you to return to clinic in 1 month for follow up. You will also need to schedule an appointment with deborah [facility] to apply for the orange card. Please keep your appointment with infectious disease on 10/23/2016. If you have any questions or concerns, call our clinic at [phone] or after hours call [phone] and ask for the internal medicine resident on call. Thank you!  - Dr. Philipp Ovens

## 2016-05-17 NOTE — Assessment & Plan Note (Addendum)
Hep C positive antibody found during recent hospitalization for work up of transaminitis. RNA titer > 1.9 million copies checked at his last office visit. He has already been referred to ID. Appointment scheduled 06/23/16. -- F/u ID

## 2016-05-18 NOTE — Progress Notes (Signed)
Internal Medicine Clinic Attending  Case discussed with Dr. Guilloud at the time of the visit.  We reviewed the resident's history and exam and pertinent patient test results.  I agree with the assessment, diagnosis, and plan of care documented in the resident's note.  

## 2016-06-17 ENCOUNTER — Encounter: Payer: Self-pay | Admitting: General Practice

## 2016-06-17 ENCOUNTER — Ambulatory Visit: Payer: Self-pay

## 2016-06-23 ENCOUNTER — Ambulatory Visit (INDEPENDENT_AMBULATORY_CARE_PROVIDER_SITE_OTHER): Payer: Self-pay | Admitting: Internal Medicine

## 2016-06-23 ENCOUNTER — Telehealth: Payer: Self-pay | Admitting: *Deleted

## 2016-06-23 ENCOUNTER — Encounter: Payer: Self-pay | Admitting: Internal Medicine

## 2016-06-23 VITALS — BP 163/99 | HR 73 | Temp 98.1°F | Ht 67.0 in | Wt 177.0 lb

## 2016-06-23 DIAGNOSIS — B182 Chronic viral hepatitis C: Secondary | ICD-10-CM

## 2016-06-23 LAB — HEPATITIS A ANTIBODY, TOTAL: Hep A Total Ab: NONREACTIVE

## 2016-06-23 LAB — PROTIME-INR
INR: 1.1
Prothrombin Time: 11.2 s (ref 9.0–11.5)

## 2016-06-23 LAB — HEPATITIS B SURFACE ANTIBODY,QUALITATIVE: Hep B S Ab: NEGATIVE

## 2016-06-23 LAB — HEPATITIS B SURFACE ANTIGEN: Hepatitis B Surface Ag: NEGATIVE

## 2016-06-23 LAB — HIV ANTIBODY (ROUTINE TESTING W REFLEX): HIV 1&2 Ab, 4th Generation: NONREACTIVE

## 2016-06-23 LAB — HEPATITIS B CORE ANTIBODY, TOTAL: Hep B Core Total Ab: NONREACTIVE

## 2016-06-23 MED ORDER — SOFOSBUVIR-VELPATASVIR 400-100 MG PO TABS: 1.0000 | ORAL_TABLET | Freq: Every day | ORAL | 2 refills | Status: DC

## 2016-06-23 NOTE — Patient Instructions (Addendum)
Date 06/23/16  Dear James [name], As discussed in the [facility], your hepatitis C therapy will include the following medications:    sofosbuvir 400 mg/velpatasvir 100 mg (Epclusa) oral daily  Please note that ALL MEDICATIONS WILL START ON THE SAME DATE for a total of 12 weeks. ---------------------------------------------------------------- Your HCV Treatment Start Date: TBA   Your HCV genotype: 3    Liver Fibrosis: TBD    ---------------------------------------------------------------- YOUR PHARMACY CONTACT (depending on your insurance):   [facility] [place], [place] [address] Phone: [phone] Hours: Monday to Friday 7:30 am to 6:00 pm   Please always contact your pharmacy at least 3-4 business days before you run out of medications to ensure your next month's medication is ready or 1 week prior to running out if you receive it by mail.  Remember, each prescription is for 28 days. ---------------------------------------------------------------- GENERAL NOTES REGARDING YOUR HEPATITIS C MEDICATION:  SOFOSBUVIR (SOVALDI or EPCLUSA): - Sofosbuvir 400 mg tablet is taken daily with OR without food. - The sofosbuvir tablets are yellow. - The Epclusa tablets are pink, diamond-shaped - The tablets should be stored at room temperature. - The most common side effects with sofosbuvir or Epclusa include:      1. Fatigue      2. Headache      3. Nausea      4. Diarrhea      5. Insomnia  - Acid reducing agents such as H2 blockers (ie. Pepcid (famotidine), Zantac (ranitidine), Tagamet (cimetidine), Axid (nizatidine) and proton pump inhibitors (ie. Prilosec (omeprazole), Protonix (pantoprazole), Nexium (esomeprazole), or Aciphex (rabeprazole)) can decrease effectiveness of Harvoni. Do not take until you have discussed with a health care provider.    -Antacids that contain magnesium and/or aluminum hydroxide (ie. Milk of Magensia, Rolaids, Gaviscon, Maalox,  Mylanta, an dArthritis Pain Formula)can reduce absorption of sofosbuvir, so take them at least 4 hours before or after Harvoni.  -Calcium carbonate (calcium supplements or antacids such as Tums, Caltrate, Os-Cal)needs to be taken at least 4 hours hours before or after sofosbuvir.  -St. John's wort or any products that contain St. John's wort like some herbal supplements  Please inform the office prior to starting any of these medications.   Please note that this only lists the most common side effects and is NOT a comprehensive list of the potential side effects of these medications. For more information, please review the drug information sheets that come with your medication package from the pharmacy.  ---------------------------------------------------------------- GENERAL HELPFUL HINTS ON HCV THERAPY: 1. No alcohol. 2. Stay well-hydrated 3. Notify the ID Clinic of any changes in your other over-the-counter/herbal or prescription medications. 4. If you miss a dose of your medication, take the missed dose as soon as you remember. Return to your regular time/dose schedule the next day.  5.  Do not stop taking your medications without first talking with your healthcare provider. 6.  You may take Tylenol (acetaminophen), as long as the dose is less than 2000 mg (OR no more than 4 tablets of the Tylenol Extra Strengths 500mg  tablet) in 24 hours. 7. You will follow up with our clinic pharmacist initially after starting the medication to monitor for any possible side effects 8. You will get labs once during treatment, soon after treatment completion and again 6 months or more after treatment completion to verify the virus is completely gone.   James [name], [place] [facility] [place]  [place], [place]  [address] [phone]

## 2016-06-23 NOTE — Telephone Encounter (Signed)
Information given to patient's sister about ultrasound appt on 7.11.18 at 10:45 AM at [facility]. Nothing to eat or drink after midnight. Also advised her to have patient fill out the [facility] financial form and return it to [employer]. Myrtis Hopping CMA

## 2016-06-23 NOTE — Progress Notes (Signed)
[name] for Infectious Disease   CC: consideration for treatment for chronic hepatitis C  HPI:  +James [name] is a 56 y.o. male who presents for initial evaluation and management of chronic hepatitis C.  Patient tested positive earlier this year during routine screning. Hepatitis C-associated risk factors present are: none. Patient denies IV drug abuse, sexual contact with person with liver disease, tattoos. Patient has had other studies performed. Results: hepatitis C RNA by PCR, result: positive. Patient has not had prior treatment for Hepatitis C. Patient does not have a past history of liver disease. Patient does not have a family history of liver disease. Patient does not  have associated signs or symptoms related to liver disease.  Labs reviewed and confirm chronic hepatitis C with a positive viral load.  Also a genotype 3.   Records reviewed from epic, he has had recent admissions related to pneumonia.       Patient does not have documented immunity to Hepatitis A. Patient does not have documented immunity to Hepatitis B.    Review of Systems:   Constitutional: negative for fatigue and malaise Gastrointestinal: negative for diarrhea Integument/breast: negative for rash All other systems reviewed and are negative       Past Medical History:  Diagnosis Date  . COPD (chronic obstructive pulmonary disease) ([place])   . Pneumothorax    2016, fell from ladder    Prior to Admission medications   Medication Sig Start Date End Date Taking? Authorizing Provider  albuterol (PROVENTIL HFA;VENTOLIN HFA) 108 (90 Base) MCG/ACT inhaler Inhale 2 puffs into the lungs every 6 (six) hours as needed for wheezing or shortness of breath. 03/28/16  Yes Shela Leff, MD  Tiotropium Bromide Monohydrate (SPIRIVA RESPIMAT) 1.25 MCG/ACT AERS Inhale 2 puffs into the lungs daily. 04/08/16  Yes Velna Ochs, MD  hydrochlorothiazide (HYDRODIURIL) 25 MG tablet Take 1 tablet (25 mg total) by  mouth daily. Patient not taking: Reported on 06/23/2016 05/17/16   Velna Ochs, MD  Sofosbuvir-Velpatasvir (EPCLUSA) 400-100 MG TABS Take 1 tablet by mouth daily. 06/23/16   Elishua Radford, Okey Regal, MD    No Known Allergies  Social History  Substance Use Topics  . Smoking status: Former Smoker    Packs/day: 1.50    Years: 40.00    Types: Cigarettes  . Smokeless tobacco: Current User    Types: Chew     Comment: 1 can daily for 3 years  . Alcohol use 12.6 oz/week    21 Cans of beer per week    Family History  Problem Relation Age of Onset  . Heart disease Father   . COPD Sister      Objective:  Constitutional: in no apparent distress and alert,  Vitals:   06/23/16 1121  BP: (!) 163/99  Pulse: 73  Temp: 98.1 F (36.7 C)   Eyes: anicteric Cardiovascular: Cor RRR Respiratory: CTA B; normal respiratory effort Gastrointestinal: Bowel sounds are normal, liver is not enlarged, spleen is not enlarged, soft, nt Musculoskeletal: no pedal edema noted Skin: negatives: no rash; no porphyria cutanea tarda Lymphatic: no cervical lymphadenopathy   Laboratory Genotype: No results found for: HCVGENOTYPE HCV viral load: No results found for: HCVQUANT Lab Results  Component Value Date   WBC 16.5 (H) 04/08/2016   HGB 14.2 04/08/2016   HCT 43.1 04/08/2016   MCV 98.0 04/08/2016   PLT 233 04/08/2016    Lab Results  Component Value Date   CREATININE 0.67 04/07/2016   BUN 16 04/07/2016  NA 140 04/07/2016   K 4.5 04/07/2016   CL 106 04/07/2016   CO2 24 04/07/2016    Lab Results  Component Value Date   ALT 59 (H) 04/16/2016   AST 32 04/16/2016   ALKPHOS 46 04/16/2016     Labs and history reviewed and show CHILD-PUGH unknown  5-6 points: Child class A 7-9 points: Child class B 10-15 points: Child class C  Lab Results  Component Value Date   INR 1.15 06/10/2013   BILITOT 0.3 04/16/2016   ALBUMIN 4.0 04/16/2016     Assessment: New Patient with Chronic Hepatitis C  genotype 3, untreated.  I discussed with the patient the lab findings that confirm chronic hepatitis C as well as the natural history and progression of disease including about 30% of people who develop cirrhosis of the liver if left untreated and once cirrhosis is established there is a 2-7% risk per year of liver cancer and liver failure.  I discussed the importance of treatment and benefits in reducing the risk, even if significant liver fibrosis exists.   Plan: 1) Patient counseled extensively on limiting acetaminophen to no more than 2 grams daily, avoidance of alcohol. 2) Transmission discussed with patient including sexual transmission, sharing razors and toothbrush.   3) Will need referral to gastroenterology if concern for cirrhosis 4) Will need referral for substance abuse counseling: No.; Further work up to include urine drug screen  No. 5) Will prescribe Epclusa for 12 weeks 6) Hepatitis A and B titers 8) Pneumovax vaccine previously given 9) Further work up to include liver staging with elastography 10) will follow up after starting medication - will try for Epclusa via [facility]

## 2016-06-26 LAB — LIVER FIBROSIS, FIBROTEST-ACTITEST
ALPHA-2-MACROGLOBULIN: 323 mg/dL — AB (ref 106–279)
ALT: 62 U/L — ABNORMAL HIGH (ref 9–46)
Apolipoprotein A1: 138 mg/dL (ref 94–176)
Bilirubin: 0.5 mg/dL (ref 0.2–1.2)
FIBROSIS SCORE: 0.49
GGT: 36 U/L (ref 3–85)
Haptoglobin: 142 mg/dL (ref 43–212)
NECROINFLAMMAT ACT SCORE: 0.44
REFERENCE ID: 2001261

## 2016-07-14 ENCOUNTER — Ambulatory Visit (HOSPITAL_COMMUNITY): Payer: Self-pay

## 2016-08-05 ENCOUNTER — Encounter: Payer: Self-pay | Admitting: Pharmacy Technician

## 2016-08-23 ENCOUNTER — Ambulatory Visit (INDEPENDENT_AMBULATORY_CARE_PROVIDER_SITE_OTHER): Payer: Self-pay | Admitting: Pharmacist Clinician (PhC)/ Clinical Pharmacy Specialist

## 2016-08-23 DIAGNOSIS — B182 Chronic viral hepatitis C: Secondary | ICD-10-CM

## 2016-08-23 NOTE — Patient Instructions (Signed)
Make sure to take ALL 3 months of Epclusa 1 tablet daily Fill out the [employer] assistance form and mail it in [place] out the [employer] assistance form and bring it back here Call the pharmacy for refill every month when you have 7 tablets left DO NOT MISS ANY DOSES

## 2016-08-23 NOTE — Progress Notes (Signed)
HPI: James [name] is a 56 y.o. male who is here for a follow up with pharmacy for his hep C.   Lab Results  Component Value Date   HEPCGENOTYPE 3 04/16/2016    Allergies: No Known Allergies  Vitals:    Past Medical History: Past Medical History:  Diagnosis Date  . COPD (chronic obstructive pulmonary disease) ([place])   . Pneumothorax    2016, fell from ladder    Social History: Social History   Social History  . Marital status: Divorced    Spouse name: N/A  . Number of children: N/A  . Years of education: N/A   Occupational History  . Mechanic   . [employer]    Social History Main Topics  . Smoking status: Former Smoker    Packs/day: 1.50    Years: 40.00    Types: Cigarettes  . Smokeless tobacco: Current User    Types: Chew     Comment: 1 can daily for 3 years  . Alcohol use 12.6 oz/week    21 Cans of beer per week  . Drug use: Yes    Types: Marijuana     Comment: daily   . Sexual activity: Not Currently    Partners: Female   Other Topics Concern  . Not on file   Social History Narrative   Lives alone near [facility]: Hep B S Ab (no units)  Date Value  06/23/2016 NEG   Hepatitis B Surface Ag (no units)  Date Value  06/23/2016 NEGATIVE   HCV Ab (s/co ratio)  Date Value  04/07/2016 >11.0 (H)    Lab Results  Component Value Date   HEPCGENOTYPE 3 04/16/2016    No flowsheet data found.  AST  Date Value  04/16/2016 32 IU/L  04/06/2016 39 U/L  03/28/2016 25 U/L   ALT  Date Value  06/23/2016 62 U/L (H)  04/16/2016 59 IU/L (H)  04/06/2016 69 U/L (H)  03/28/2016 43 U/L   INR (no units)  Date Value  06/23/2016 1.1  06/10/2013 1.15  06/06/2013 1.00    CrCl: CrCl cannot be calculated (Patient's most recent lab result is older than the maximum 21 days allowed.).  Fibrosis Score: F2 as assessed by Fibrosure   Child-Pugh Score: Class A  Previous Treatment Regimen: None  Assessment: Daryle started Epclusa on 7/30 for his  geno 3 hep C through [facility]. He has about 7 tablets left. Told him that he needs to call the pharmacy immediately after this visit to get the second month supply. Advised him that he must avoid missing and doses at all cost. His cognitive function is not all there. Wrote down specific directions on his AVS on what to do. He needs to fill out both the [employer] and [employer] forms today. He has filled out the [facility] one at this visit. Advised him that we can't do labs until he is approved for the [facility] Assistance. So far, he has not missed any doses and the only side effect that he is experiencing is fatigue. This is a pretty common side effects with Epclusa. We will see in at the EOT visit to do labs then after the [facility] lab visit for the cure.   Recommendations:  Cont Epclusa 1 daily x 12 wks Call in for refills each month F/u at the EOT visit for labs F/u after the [facility] labs for cure   [place], Pharm.D., BCPS, AAHIVP Clinical Infectious [place]  for Infectious Disease 08/23/2016, 10:13 AM

## 2016-10-26 ENCOUNTER — Ambulatory Visit (INDEPENDENT_AMBULATORY_CARE_PROVIDER_SITE_OTHER): Payer: Self-pay | Admitting: Pharmacist Clinician (PhC)/ Clinical Pharmacy Specialist

## 2016-10-26 ENCOUNTER — Other Ambulatory Visit: Payer: Self-pay | Admitting: Pharmacist Clinician (PhC)/ Clinical Pharmacy Specialist

## 2016-10-26 DIAGNOSIS — B182 Chronic viral hepatitis C: Secondary | ICD-10-CM

## 2016-10-26 LAB — COMPREHENSIVE METABOLIC PANEL
AG Ratio: 1.8 (calc) (ref 1.0–2.5)
ALBUMIN MSPROF: 4.2 g/dL (ref 3.6–5.1)
ALKALINE PHOSPHATASE (APISO): 45 U/L (ref 40–115)
ALT: 23 U/L (ref 9–46)
AST: 24 U/L (ref 10–35)
BILIRUBIN TOTAL: 0.4 mg/dL (ref 0.2–1.2)
BUN: 12 mg/dL (ref 7–25)
CALCIUM: 9.8 mg/dL (ref 8.6–10.3)
CHLORIDE: 104 mmol/L (ref 98–110)
CO2: 27 mmol/L (ref 20–32)
Creat: 0.79 mg/dL (ref 0.70–1.33)
GLOBULIN: 2.3 g/dL (ref 1.9–3.7)
Glucose, Bld: 93 mg/dL (ref 65–99)
POTASSIUM: 4.6 mmol/L (ref 3.5–5.3)
Sodium: 139 mmol/L (ref 135–146)
Total Protein: 6.5 g/dL (ref 6.1–8.1)

## 2016-10-26 NOTE — Progress Notes (Addendum)
HPI: James [name] is a 56 y.o. male who presents to the pharmacy clinic today for a follow-up on his Hepatitis C.   Lab Results  Component Value Date   HEPCGENOTYPE 3 04/16/2016    Allergies: No Known Allergies  Vitals:    Past Medical History: Past Medical History:  Diagnosis Date  . COPD (chronic obstructive pulmonary disease) ([place])   . Pneumothorax    2016, fell from ladder    Social History: Social History   Social History  . Marital status: Divorced    Spouse name: N/A  . Number of children: N/A  . Years of education: N/A   Occupational History  . Mechanic   . [employer]    Social History Main Topics  . Smoking status: Former Smoker    Packs/day: 1.50    Years: 40.00    Types: Cigarettes  . Smokeless tobacco: Current User    Types: Chew     Comment: 1 can daily for 3 years  . Alcohol use 12.6 oz/week    21 Cans of beer per week  . Drug use: Yes    Types: Marijuana     Comment: daily   . Sexual activity: Not Currently    Partners: Female   Other Topics Concern  . Not on file   Social History Narrative   Lives alone near [facility]: Hep B S Ab (no units)  Date Value  06/23/2016 NEG   Hepatitis B Surface Ag (no units)  Date Value  06/23/2016 NEGATIVE   HCV Ab (s/co ratio)  Date Value  04/07/2016 >11.0 (H)    Lab Results  Component Value Date   HEPCGENOTYPE 3 04/16/2016    No flowsheet data found.  AST  Date Value  04/16/2016 32 IU/L  04/06/2016 39 U/L  03/28/2016 25 U/L   ALT  Date Value  06/23/2016 62 U/L (H)  04/16/2016 59 IU/L (H)  04/06/2016 69 U/L (H)  03/28/2016 43 U/L   INR (no units)  Date Value  06/23/2016 1.1  06/10/2013 1.15  06/06/2013 1.00     Fibrosis Score: F2  as assessed by Fibrosure  Child-Pugh Score: Class A  Previous Treatment Regimen: None  Assessment: James [name] was started on Epclusa on 7/30 for his genotype 3 hepatitis C. He took his last dose on Sunday 10/21. He reports that he  did not miss any doses of his medication even though it made him feel very fatigued. James [name] reports that he feels much better now that he is off of his medication because it was so draining. He also reports that he did not drink at all while he was on the [place] because of how the [place] made him feel.  We will check his viral load today. He did not get labs at his last visit due to lack of insurance. He may still not have filled out his paperwork for lab assistance but labs have to be checked today. Encouraged him to continue sobriety in order to preserve his liver function.   Recommendations: - Check viral load and CMET today - F/U with lab on 1/22 for viral load - F/u with pharmacy on 1/29 for SVR visit   Susa Raring, Pharm.[place] for Infectious Disease 10/26/2016, 9:27 AM

## 2016-10-26 NOTE — Progress Notes (Signed)
Agreed with Emily's note. It's good that he has abstained from ETOH. Due to his mental status, I'm not sure if he has filled out anything for his [facility] assistance.   Onnie Boer, PharmD, BCPS, AAHIVP, CPP Infectious Disease Pharmacist Pager: [phone] 10/26/2016 11:17 AM

## 2016-10-26 NOTE — Patient Instructions (Signed)
Come back for labs on 1/22  Come back to see the pharmacist on 1/29.

## 2016-10-28 LAB — HEPATITIS C RNA QUANTITATIVE
HCV QUANT LOG: NOT DETECTED {Log_IU}/mL
HCV RNA, PCR, QN: NOT DETECTED [IU]/mL

## 2017-01-25 ENCOUNTER — Other Ambulatory Visit: Payer: Self-pay

## 2017-01-25 DIAGNOSIS — B182 Chronic viral hepatitis C: Secondary | ICD-10-CM

## 2017-01-27 LAB — HEPATITIS C RNA QUANTITATIVE
HCV QUANT LOG: NOT DETECTED {Log_IU}/mL
HCV RNA, PCR, QN: NOT DETECTED [IU]/mL

## 2017-02-01 ENCOUNTER — Ambulatory Visit (INDEPENDENT_AMBULATORY_CARE_PROVIDER_SITE_OTHER): Payer: Self-pay | Admitting: Pharmacist Clinician (PhC)/ Clinical Pharmacy Specialist

## 2017-02-01 DIAGNOSIS — B182 Chronic viral hepatitis C: Secondary | ICD-10-CM

## 2017-02-01 NOTE — Progress Notes (Signed)
HPI: James [name] is a 57 y.o. male who is here for his cured visit with pharmacy.   Lab Results  Component Value Date   HEPCGENOTYPE 3 04/16/2016    Allergies: No Known Allergies  Vitals:    Past Medical History: Past Medical History:  Diagnosis Date  . COPD (chronic obstructive pulmonary disease) ([place])   . Pneumothorax    2016, fell from ladder    Social History: Social History   Socioeconomic History  . Marital status: Divorced    Spouse name: Not on file  . Number of children: Not on file  . Years of education: Not on file  . Highest education level: Not on file  Social Needs  . Financial resource strain: Not on file  . Food insecurity - worry: Not on file  . Food insecurity - inability: Not on file  . Transportation needs - medical: Not on file  . Transportation needs - non-medical: Not on file  Occupational History  . Occupation: Dealer  . Occupation: Painter  Tobacco Use  . Smoking status: Former Smoker    Packs/day: 1.50    Years: 40.00    Pack years: 60.00    Types: Cigarettes  . Smokeless tobacco: Current User    Types: Chew  . Tobacco comment: 1 can daily for 3 years  Substance and Sexual Activity  . Alcohol use: Yes    Alcohol/week: 12.6 oz    Types: 21 Cans of beer per week  . Drug use: Yes    Types: Marijuana    Comment: daily   . Sexual activity: Not Currently    Partners: Female  Other Topics Concern  . Not on file  Social History Narrative   Lives alone near [facility]: Hep B S Ab (no units)  Date Value  06/23/2016 NEG   Hepatitis B Surface Ag (no units)  Date Value  06/23/2016 NEGATIVE   HCV Ab (s/co ratio)  Date Value  04/07/2016 >11.0 (H)    Lab Results  Component Value Date   HEPCGENOTYPE 3 04/16/2016    Hepatitis C RNA quantitative Latest Ref Rng & Units 01/25/2017 10/26/2016  HCV Quantitative Log NOT DETECT Log IU/mL <1.18 NOT DETECTED <1.18 NOT DETECTED    AST  Date Value  10/26/2016 24 U/L   04/16/2016 32 IU/L  04/06/2016 39 U/L   ALT  Date Value  10/26/2016 23 U/L  06/23/2016 62 U/L (H)  04/16/2016 59 IU/L (H)  04/06/2016 69 U/L (H)   INR (no units)  Date Value  06/23/2016 1.1  06/10/2013 1.15  06/06/2013 1.00    CrCl: CrCl cannot be calculated (Patient's most recent lab result is older than the maximum 21 days allowed.).  Fibrosis Score: F2 as assessed by Fibrosure  Child-Pugh Score: Class A  Previous Treatment Regimen: None  Assessment: Theophil finished his Epclusa for genotype 3 hep C in Oct. His EOT VL was neg. He came in last week for his SVR12 and it came back neg also. He is now cured. He still drinks about 5-6 beers a day. Encouraged him to stop to further damaging his liver. He is an uninsured patient. His baseline plts >150k.   Recommendations:  Cured of hep C No longer need to f/u with [place]  [facility], Pharm.D., BCPS, AAHIVP Clinical Infectious [place] for Infectious Disease 02/01/2017, 8:51 AM

## 2017-07-11 ENCOUNTER — Emergency Department (HOSPITAL_COMMUNITY)
Admission: EM | Admit: 2017-07-11 | Discharge: 2017-07-11 | Disposition: A | Discharge status: Home / Self Care | Payer: Self-pay | Attending: Emergency Medicine | Admitting: Emergency Medicine

## 2017-07-11 ENCOUNTER — Emergency Department (HOSPITAL_COMMUNITY): Payer: Self-pay

## 2017-07-11 ENCOUNTER — Encounter (HOSPITAL_COMMUNITY): Payer: Self-pay | Admitting: Emergency Medicine

## 2017-07-11 ENCOUNTER — Other Ambulatory Visit: Payer: Self-pay

## 2017-07-11 DIAGNOSIS — J441 Chronic obstructive pulmonary disease with (acute) exacerbation: Secondary | ICD-10-CM | POA: Insufficient documentation

## 2017-07-11 DIAGNOSIS — Z87891 Personal history of nicotine dependence: Secondary | ICD-10-CM | POA: Insufficient documentation

## 2017-07-11 DIAGNOSIS — Z79899 Other long term (current) drug therapy: Secondary | ICD-10-CM | POA: Insufficient documentation

## 2017-07-11 DIAGNOSIS — I10 Essential (primary) hypertension: Secondary | ICD-10-CM | POA: Insufficient documentation

## 2017-07-11 MED ORDER — PREDNISONE 20 MG PO TABS
60.0000 mg | ORAL_TABLET | Freq: Once | ORAL | Status: AC
  Administered 2017-07-11: 60 mg via ORAL
  Filled 2017-07-11: qty 3

## 2017-07-11 MED ORDER — PREDNISONE 10 MG PO TABS: ORAL_TABLET | ORAL | 0 refills | Status: DC

## 2017-07-11 MED ORDER — IPRATROPIUM-ALBUTEROL 0.5-2.5 (3) MG/3ML IN SOLN
3.0000 mL | Freq: Once | RESPIRATORY_TRACT | Status: AC
  Administered 2017-07-11: 3 mL via RESPIRATORY_TRACT
  Filled 2017-07-11: qty 3

## 2017-07-11 MED ORDER — ALBUTEROL SULFATE HFA 108 (90 BASE) MCG/ACT IN AERS: 1.0000 | INHALATION_SPRAY | Freq: Four times a day (QID) | RESPIRATORY_TRACT | 0 refills | Status: DC | PRN

## 2017-07-11 MED ORDER — ALBUTEROL SULFATE HFA 108 (90 BASE) MCG/ACT IN AERS
2.0000 | INHALATION_SPRAY | Freq: Once | RESPIRATORY_TRACT | Status: AC
  Administered 2017-07-11: 2 via RESPIRATORY_TRACT
  Filled 2017-07-11: qty 6.7

## 2017-07-11 NOTE — Progress Notes (Signed)
RT note: RT used teach back method to instruct with spacer use for MDI. Patient had good effort and understanding.

## 2017-07-11 NOTE — ED Notes (Signed)
Resting quietly on stretcher - states feeling better - resp even - nonlabored

## 2017-07-11 NOTE — ED Notes (Signed)
Neb tx in progress - pt interm falling asleep - o2 sat on neb 96%

## 2017-07-11 NOTE — ED Triage Notes (Signed)
Pt arrives via EMS from home with complaints of SOB since Saturday, woke up today and felt he could not breathe. Wheezing heard in all lobes. EMS gave 10 albuterol, 0.5 atrovent, 125 solu-medrol. Pt was 94% on RA. Reports hx of COPD

## 2017-07-11 NOTE — ED Provider Notes (Signed)
[facility] [facility] EMERGENCY DEPARTMENT Provider Note   CSN: [id number] Arrival date & time: 07/11/17  0802     History   Chief Complaint Chief Complaint  Patient presents with  . Shortness of Breath    HPI ISHMAEL [name] is a 57 y.o. male.  The history is provided by the patient. No language interpreter was used.  Shortness of Breath  This is a new problem. The average episode lasts 1 day. The problem occurs rarely.The current episode started yesterday. The problem has not changed since onset.Associated symptoms include cough. Pertinent negatives include no fever. It is unknown what precipitated the problem. He has tried nothing for the symptoms. The treatment provided no relief. He has had no prior hospitalizations. Associated medical issues include COPD.   Pt complains of shortness of breath.  Pt has a history of Copd.  Pt is out of albuterol.   Past Medical History:  Diagnosis Date  . COPD (chronic obstructive pulmonary disease) ([place])   . Pneumothorax    2016, fell from ladder    Patient Active Problem List   Diagnosis Date Noted  . Hypertension 05/17/2016  . Low back pain radiating to both legs 04/18/2016  . History of tobacco abuse 04/06/2016  . Alcohol abuse 04/06/2016  . Elevated liver enzymes 04/06/2016  . COPD (chronic obstructive pulmonary disease) ([place]) 03/26/2016    Past Surgical History:  Procedure Laterality Date  . VIDEO ASSISTED THORACOSCOPY (VATS)/THOROCOTOMY Left 06/11/2013   Procedure: VIDEO ASSISTED THORACOSCOPY (VATS)/THOROCOTOMY;  Surgeon: Ivin Poot, MD;  Location: [facility];  Service: Thoracic;  Laterality: Left;  [name] VIDEO BRONCHOSCOPY N/A 06/11/2013   Procedure: VIDEO BRONCHOSCOPY;  Surgeon: Ivin Poot, MD;  Location: [facility] OR;  Service: Thoracic;  Laterality: N/A;        Home Medications    Prior to Admission medications   Medication Sig Start Date End Date Taking? Authorizing Provider  albuterol (PROVENTIL HFA;VENTOLIN HFA)  108 (90 Base) MCG/ACT inhaler Inhale 2 puffs into the lungs every 6 (six) hours as needed for wheezing or shortness of breath. 03/28/16   Shela Leff, MD  hydrochlorothiazide (HYDRODIURIL) 25 MG tablet Take 1 tablet (25 mg total) by mouth daily. Patient not taking: Reported on 06/23/2016 05/17/16   Velna Ochs, MD  Tiotropium Bromide Monohydrate (SPIRIVA RESPIMAT) 1.25 MCG/ACT AERS Inhale 2 puffs into the lungs daily. 04/08/16   Velna Ochs, MD    Family History Family History  Problem Relation Age of Onset  . Heart disease Father   . COPD Sister     Social History Social History   Tobacco Use  . Smoking status: Former Smoker    Packs/day: 1.50    Years: 40.00    Pack years: 60.00    Types: Cigarettes  . Smokeless tobacco: Current User    Types: Chew  . Tobacco comment: 1 can daily for 3 years  Substance Use Topics  . Alcohol use: Yes    Alcohol/week: 12.6 oz    Types: 21 Cans of beer per week  . Drug use: Yes    Types: Marijuana    Comment: daily      Allergies   Patient has no known allergies.   Review of Systems Review of Systems  Constitutional: Negative for fever.  Respiratory: Positive for cough and shortness of breath.   All other systems reviewed and are negative.    Physical Exam Updated Vital Signs BP (!) 150/90 (BP Location: Right Arm)   Pulse 86  Temp 97.8 F (36.6 C) (Oral)   Resp (!) 22   Ht 5\' 9"  (1.753 m)   Wt 74.8 kg (165 lb)   SpO2 98%   BMI 24.37 kg/m   Physical Exam  Constitutional: He appears well-developed and well-nourished.  HENT:  Head: Normocephalic and atraumatic.  Mouth/Throat: Oropharynx is clear and moist.  Eyes: Pupils are equal, round, and reactive to light. Conjunctivae and EOM are normal.  Neck: Neck supple.  Cardiovascular: Normal rate and regular rhythm.  No murmur heard. Pulmonary/Chest: Effort normal. No respiratory distress. He has wheezes in the right lower field and the left lower field.    Abdominal: Soft. There is no tenderness.  Musculoskeletal: He exhibits no edema.  Neurological: He is alert.  Skin: Skin is warm and dry.  Psychiatric: He has a normal mood and affect.  Nursing note and vitals reviewed.    ED Treatments / Results  Labs (all labs ordered are listed, but only abnormal results are displayed) Labs Reviewed - No data to display  EKG EKG Interpretation  Date/Time:  Monday July 11 2017 08:11:18 EDT Ventricular Rate:  83 PR Interval:    QRS Duration: 98 QT Interval:  365 QTC Calculation: 429 R Axis:   73 Text Interpretation:  Sinus rhythm Anteroseptal infarct, old No significant change was found Confirmed by Jola Schmidt [phone]) on 07/11/2017 8:18:10 AM   Radiology Dg Chest 2 View  Result Date: 07/11/2017 CLINICAL DATA:  Shortness of breath and cough. EXAM: CHEST - 2 VIEW COMPARISON:  04/07/2016. FINDINGS: Trachea is midline. Heart size normal. Thoracic aorta is calcified. Lungs are mildly hyperinflated but clear. Chronic blunting of the left costophrenic angle. No pleural fluid. Old left clavicle fracture and multiple old bilateral rib fractures. There may be an old left scapular fracture as well. IMPRESSION: Mild hyperinflation without acute finding. Electronically Signed   By: Lorin Picket M.D.   On: 07/11/2017 08:57    Procedures Procedures (including critical care time)  Medications Ordered in ED Medications  albuterol (PROVENTIL HFA;VENTOLIN HFA) 108 (90 Base) MCG/ACT inhaler 2 puff (has no administration in time range)  predniSONE (DELTASONE) tablet 60 mg (has no administration in time range)     Initial Impression / Assessment and Plan / ED Course  I have reviewed the triage vital signs and the nursing notes.  Pertinent labs & imaging results that were available during my care of the patient were reviewed by me and considered in my medical decision making (see chart for details).     MDM  Pt reports he is feeling better after  duoneb.  Pt given albuterol inhaler here.  Pt given prednisone 60mg .  Rx for albuterol inhaler and prednisone.  Pt given number for wellness clinic   Final Clinical Impressions(s) / ED Diagnoses   Final diagnoses:  COPD exacerbation [facility])    ED Discharge Orders        Ordered    albuterol (PROVENTIL HFA;VENTOLIN HFA) 108 (90 Base) MCG/ACT inhaler  Every 6 hours PRN     07/11/17 1018    predniSONE (DELTASONE) 10 MG tablet     07/11/17 1018    An After Visit Summary was printed and given to the patient.    Sidney Ace 07/11/17 1018    Jola Schmidt, MD 07/12/17 1610

## 2017-07-11 NOTE — Discharge Instructions (Signed)
Return if any problems.

## 2018-02-16 ENCOUNTER — Other Ambulatory Visit: Payer: Self-pay

## 2018-02-16 ENCOUNTER — Encounter (HOSPITAL_COMMUNITY): Payer: Self-pay | Admitting: Emergency Medicine

## 2018-02-16 ENCOUNTER — Emergency Department (HOSPITAL_COMMUNITY): Payer: Self-pay

## 2018-02-16 ENCOUNTER — Inpatient Hospital Stay (HOSPITAL_COMMUNITY)
Admission: EM | Admit: 2018-02-16 | Discharge: 2018-02-21 | DRG: 190 | Disposition: A | Discharge status: Home / Self Care | Payer: Self-pay | Attending: Internal Medicine | Admitting: Internal Medicine

## 2018-02-16 DIAGNOSIS — T380X5A Adverse effect of glucocorticoids and synthetic analogues, initial encounter: Secondary | ICD-10-CM | POA: Diagnosis not present

## 2018-02-16 DIAGNOSIS — F1290 Cannabis use, unspecified, uncomplicated: Secondary | ICD-10-CM | POA: Diagnosis present

## 2018-02-16 DIAGNOSIS — I10 Essential (primary) hypertension: Secondary | ICD-10-CM | POA: Diagnosis present

## 2018-02-16 DIAGNOSIS — F17220 Nicotine dependence, chewing tobacco, uncomplicated: Secondary | ICD-10-CM | POA: Diagnosis present

## 2018-02-16 DIAGNOSIS — F1020 Alcohol dependence, uncomplicated: Secondary | ICD-10-CM | POA: Diagnosis present

## 2018-02-16 DIAGNOSIS — F1010 Alcohol abuse, uncomplicated: Secondary | ICD-10-CM | POA: Diagnosis present

## 2018-02-16 DIAGNOSIS — Z9114 Patient's other noncompliance with medication regimen: Secondary | ICD-10-CM

## 2018-02-16 DIAGNOSIS — Z825 Family history of asthma and other chronic lower respiratory diseases: Secondary | ICD-10-CM

## 2018-02-16 DIAGNOSIS — J441 Chronic obstructive pulmonary disease with (acute) exacerbation: Principal | ICD-10-CM | POA: Diagnosis present

## 2018-02-16 DIAGNOSIS — Z8249 Family history of ischemic heart disease and other diseases of the circulatory system: Secondary | ICD-10-CM

## 2018-02-16 DIAGNOSIS — J9621 Acute and chronic respiratory failure with hypoxia: Secondary | ICD-10-CM | POA: Diagnosis present

## 2018-02-16 DIAGNOSIS — D72829 Elevated white blood cell count, unspecified: Secondary | ICD-10-CM | POA: Diagnosis not present

## 2018-02-16 DIAGNOSIS — Z7141 Alcohol abuse counseling and surveillance of alcoholic: Secondary | ICD-10-CM

## 2018-02-16 DIAGNOSIS — J9601 Acute respiratory failure with hypoxia: Secondary | ICD-10-CM | POA: Diagnosis present

## 2018-02-16 DIAGNOSIS — Z79899 Other long term (current) drug therapy: Secondary | ICD-10-CM

## 2018-02-16 DIAGNOSIS — J9620 Acute and chronic respiratory failure, unspecified whether with hypoxia or hypercapnia: Secondary | ICD-10-CM | POA: Diagnosis present

## 2018-02-16 LAB — BASIC METABOLIC PANEL
Anion gap: 12 (ref 5–15)
BUN: 9 mg/dL (ref 6–20)
CHLORIDE: 101 mmol/L (ref 98–111)
CO2: 28 mmol/L (ref 22–32)
CREATININE: 0.72 mg/dL (ref 0.61–1.24)
Calcium: 8.8 mg/dL — ABNORMAL LOW (ref 8.9–10.3)
GFR calc Af Amer: >60 mL/min (ref >60)
GFR calc non Af Amer: >60 mL/min (ref >60)
GLUCOSE: 121 mg/dL — AB (ref 70–99)
POTASSIUM: 3.7 mmol/L (ref 3.5–5.1)
Sodium: 141 mmol/L (ref 135–145)

## 2018-02-16 LAB — CBC WITH DIFFERENTIAL/PLATELET
ABS IMMATURE GRANULOCYTES: 0.01 10*3/uL (ref 0.00–0.07)
BASOS PCT: 0 %
Basophils Absolute: 0 10*3/uL (ref 0.0–0.1)
EOS PCT: 1 %
Eosinophils Absolute: 0.1 10*3/uL (ref 0.0–0.5)
HCT: 43.9 % (ref 39.0–52.0)
HEMOGLOBIN: 14.6 g/dL (ref 13.0–17.0)
Immature Granulocytes: 0 %
LYMPHS PCT: 45 %
Lymphs Abs: 3.3 10*3/uL (ref 0.7–4.0)
MCH: 32.6 pg (ref 26.0–34.0)
MCHC: 33.3 g/dL (ref 30.0–36.0)
MCV: 98 fL (ref 80.0–100.0)
MONO ABS: 0.7 10*3/uL (ref 0.1–1.0)
MONOS PCT: 10 %
NEUTROS ABS: 3.2 10*3/uL (ref 1.7–7.7)
Neutrophils Relative %: 44 %
Platelets: 216 10*3/uL (ref 150–400)
RBC: 4.48 MIL/uL (ref 4.22–5.81)
RDW: 11.9 % (ref 11.5–15.5)
WBC: 7.4 10*3/uL (ref 4.0–10.5)
nRBC: 0 % (ref 0.0–0.2)

## 2018-02-16 LAB — I-STAT TROPONIN, ED: Troponin i, poc: 0.03 ng/mL (ref 0.00–0.08)

## 2018-02-16 LAB — HIV ANTIBODY (ROUTINE TESTING W REFLEX): HIV Screen 4th Generation wRfx: NONREACTIVE

## 2018-02-16 MED ORDER — LORAZEPAM 2 MG/ML IJ SOLN
1.0000 mg | Freq: Four times a day (QID) | INTRAMUSCULAR | Status: AC | PRN
  Administered 2018-02-16 – 2018-02-17 (×3): 1 mg via INTRAVENOUS
  Filled 2018-02-16 (×3): qty 1

## 2018-02-16 MED ORDER — LORAZEPAM 1 MG PO TABS
1.0000 mg | ORAL_TABLET | Freq: Four times a day (QID) | ORAL | Status: AC | PRN
  Administered 2018-02-16 (×2): 1 mg via ORAL
  Filled 2018-02-16 (×2): qty 1

## 2018-02-16 MED ORDER — FOLIC ACID 1 MG PO TABS
1.0000 mg | ORAL_TABLET | Freq: Every day | ORAL | Status: DC
  Administered 2018-02-16 – 2018-02-21 (×6): 1 mg via ORAL
  Filled 2018-02-16 (×5): qty 1

## 2018-02-16 MED ORDER — ALBUTEROL (5 MG/ML) CONTINUOUS INHALATION SOLN
10.0000 mg/h | INHALATION_SOLUTION | RESPIRATORY_TRACT | Status: DC
  Administered 2018-02-16: 10 mg/h via RESPIRATORY_TRACT
  Filled 2018-02-16: qty 20

## 2018-02-16 MED ORDER — LORAZEPAM 2 MG/ML IJ SOLN
2.0000 mg | Freq: Once | INTRAMUSCULAR | Status: AC
  Administered 2018-02-16: 2 mg via INTRAVENOUS
  Filled 2018-02-16: qty 1

## 2018-02-16 MED ORDER — ADULT MULTIVITAMIN W/MINERALS CH
1.0000 | ORAL_TABLET | Freq: Every day | ORAL | Status: DC
  Administered 2018-02-16 – 2018-02-21 (×6): 1 via ORAL
  Filled 2018-02-16 (×6): qty 1

## 2018-02-16 MED ORDER — BUDESONIDE 0.5 MG/2ML IN SUSP
2.0000 mg | Freq: Four times a day (QID) | RESPIRATORY_TRACT | Status: DC
  Administered 2018-02-16: 2 mg via RESPIRATORY_TRACT
  Administered 2018-02-16 (×2): 0.5 mg via RESPIRATORY_TRACT
  Filled 2018-02-16 (×6): qty 8

## 2018-02-16 MED ORDER — ALBUTEROL SULFATE (2.5 MG/3ML) 0.083% IN NEBU: 2.5000 mg | INHALATION_SOLUTION | RESPIRATORY_TRACT | Status: DC | PRN

## 2018-02-16 MED ORDER — ENOXAPARIN SODIUM 40 MG/0.4ML ~~LOC~~ SOLN
40.0000 mg | SUBCUTANEOUS | Status: DC
  Administered 2018-02-16 – 2018-02-20 (×5): 40 mg via SUBCUTANEOUS
  Filled 2018-02-16 (×6): qty 0.4

## 2018-02-16 MED ORDER — BUDESONIDE 0.5 MG/2ML IN SUSP
0.5000 mg | Freq: Two times a day (BID) | RESPIRATORY_TRACT | Status: DC
  Administered 2018-02-16 – 2018-02-19 (×6): 0.5 mg via RESPIRATORY_TRACT
  Filled 2018-02-16 (×5): qty 2

## 2018-02-16 MED ORDER — THIAMINE HCL 100 MG/ML IJ SOLN
100.0000 mg | Freq: Every day | INTRAMUSCULAR | Status: DC
  Filled 2018-02-16 (×2): qty 2

## 2018-02-16 MED ORDER — DEXTROSE 5 % IV SOLN
250.0000 mg | INTRAVENOUS | Status: DC
  Administered 2018-02-17 – 2018-02-18 (×2): 250 mg via INTRAVENOUS
  Filled 2018-02-16 (×2): qty 250

## 2018-02-16 MED ORDER — SODIUM CHLORIDE 0.9 % IV SOLN: 1.0000 g | INTRAVENOUS | Status: DC

## 2018-02-16 MED ORDER — IPRATROPIUM-ALBUTEROL 0.5-2.5 (3) MG/3ML IN SOLN
3.0000 mL | Freq: Four times a day (QID) | RESPIRATORY_TRACT | Status: DC
  Administered 2018-02-16 – 2018-02-19 (×14): 3 mL via RESPIRATORY_TRACT
  Filled 2018-02-16 (×14): qty 3

## 2018-02-16 MED ORDER — VITAMIN B-1 100 MG PO TABS
100.0000 mg | ORAL_TABLET | Freq: Every day | ORAL | Status: DC
  Administered 2018-02-16 – 2018-02-21 (×6): 100 mg via ORAL
  Filled 2018-02-16 (×6): qty 1

## 2018-02-16 MED ORDER — METHYLPREDNISOLONE SODIUM SUCC 125 MG IJ SOLR
60.0000 mg | Freq: Three times a day (TID) | INTRAMUSCULAR | Status: DC
  Administered 2018-02-16 – 2018-02-17 (×4): 60 mg via INTRAVENOUS
  Filled 2018-02-16 (×4): qty 2

## 2018-02-16 MED ORDER — SODIUM CHLORIDE 0.9 % IV SOLN
500.0000 mg | Freq: Once | INTRAVENOUS | Status: AC
  Administered 2018-02-16: 500 mg via INTRAVENOUS
  Filled 2018-02-16: qty 500

## 2018-02-16 NOTE — Progress Notes (Signed)
Patient admitted after midnight, please see H&P.  Needs follow up and medication help as no insurance.  Having tremors-- suspect alcohol withdrawal-- may need scheduled CIWA.  Not moving much air on auscultation.  Continue IV steroid, breathing treatments scheduled and PRN.  Wean O2 as tolerated.  Suspect 1-2 more days in the hospital.  Eulogio Bear DO

## 2018-02-16 NOTE — ED Provider Notes (Addendum)
[place] EMERGENCY DEPARTMENT Provider Note   CSN: [id number] Arrival date & time: 02/16/18  0003     History   Chief Complaint Chief Complaint  Patient presents with  . Shortness of Breath    HPI James [name] is a 58 y.o. male.  The history is provided by the patient.  Shortness of Breath  Severity:  Severe Onset quality:  Gradual Duration:  2 days Timing:  Constant Progression:  Worsening Chronicity:  Recurrent Context: URI   Relieved by:  Nothing Worsened by:  Nothing Ineffective treatments:  None tried Associated symptoms: cough, sputum production and wheezing   Associated symptoms: no chest pain, no fever and no hemoptysis   Risk factors: tobacco use     Past Medical History:  Diagnosis Date  . COPD (chronic obstructive pulmonary disease) ([place])   . Pneumothorax    2016, fell from ladder    Patient Active Problem List   Diagnosis Date Noted  . Hypertension 05/17/2016  . Low back pain radiating to both legs 04/18/2016  . History of tobacco abuse 04/06/2016  . Alcohol abuse 04/06/2016  . Elevated liver enzymes 04/06/2016  . COPD (chronic obstructive pulmonary disease) ([place]) 03/26/2016    Past Surgical History:  Procedure Laterality Date  . VIDEO ASSISTED THORACOSCOPY (VATS)/THOROCOTOMY Left 06/11/2013   Procedure: VIDEO ASSISTED THORACOSCOPY (VATS)/THOROCOTOMY;  Surgeon: Ivin Poot, MD;  Location: [facility];  Service: Thoracic;  Laterality: Left;  [name] VIDEO BRONCHOSCOPY N/A 06/11/2013   Procedure: VIDEO BRONCHOSCOPY;  Surgeon: Ivin Poot, MD;  Location: [facility] OR;  Service: Thoracic;  Laterality: N/A;        Home Medications    Prior to Admission medications   Medication Sig Start Date End Date Taking? Authorizing Provider  albuterol (PROVENTIL HFA;VENTOLIN HFA) 108 (90 Base) MCG/ACT inhaler Inhale 1-2 puffs into the lungs every 6 (six) hours as needed for wheezing or shortness of breath. 07/11/17   Fransico Meadow, PA-C    predniSONE (DELTASONE) 10 MG tablet 6 tablets on 1st day, 5. Tablets on second day, 4 on 3rd, 3 on 4th, 2 on 5th and 1 on 6th days 07/11/17   Fransico Meadow, PA-C  Tiotropium Bromide Monohydrate (SPIRIVA RESPIMAT) 1.25 MCG/ACT AERS Inhale 2 puffs into the lungs daily. 04/08/16   Velna Ochs, MD    Family History Family History  Problem Relation Age of Onset  . Heart disease Father   . COPD Sister     Social History Social History   Tobacco Use  . Smoking status: Former Smoker    Packs/day: 1.50    Years: 40.00    Pack years: 60.00    Types: Cigarettes  . Smokeless tobacco: Current User    Types: Chew  . Tobacco comment: 1 can daily for 3 years  Substance Use Topics  . Alcohol use: Yes    Alcohol/week: 21.0 standard drinks    Types: 21 Cans of beer per week  . Drug use: Yes    Types: Marijuana    Comment: daily      Allergies   Patient has no known allergies.   Review of Systems Review of Systems  Constitutional: Negative for fever.  Respiratory: Positive for cough, sputum production, shortness of breath and wheezing. Negative for hemoptysis.   Cardiovascular: Negative for chest pain, palpitations and leg swelling.  All other systems reviewed and are negative.    Physical Exam Updated Vital Signs BP 116/74   Pulse 97   Temp  98.3 F (36.8 C) (Oral)   Resp 19   Ht 5\' 9"  (1.753 m)   Wt 77.1 kg   SpO2 96%   BMI 25.10 kg/m   Physical Exam Vitals signs and nursing note reviewed.  Constitutional:      General: He is not in acute distress.    Appearance: He is normal weight.  HENT:     Head: Normocephalic and atraumatic.     Nose: Nose normal.     Mouth/Throat:     Mouth: Mucous membranes are moist.     Pharynx: Oropharynx is clear.  Eyes:     Conjunctiva/sclera: Conjunctivae normal.     Pupils: Pupils are equal, round, and reactive to light.  Neck:     Musculoskeletal: Normal range of motion and neck supple.  Cardiovascular:     Rate and  Rhythm: Normal rate and regular rhythm.  Pulmonary:     Effort: Tachypnea present.     Breath sounds: Wheezing present.  Abdominal:     General: Abdomen is flat. Bowel sounds are normal.     Tenderness: There is no abdominal tenderness. There is no guarding.  Musculoskeletal: Normal range of motion.     Right lower leg: No edema.     Left lower leg: No edema.  Skin:    General: Skin is warm and dry.     Capillary Refill: Capillary refill takes less than 2 seconds.  Neurological:     General: No focal deficit present.     Mental Status: He is alert and oriented to person, place, and time.  Psychiatric:        Mood and Affect: Mood normal.        Behavior: Behavior normal.      ED Treatments / Results  Labs (all labs ordered are listed, but only abnormal results are displayed) Results for orders placed or performed during the hospital encounter of 02/16/18  CBC with Differential/Platelet  Result Value Ref Range   WBC 7.4 4.0 - 10.5 K/uL   RBC 4.48 4.22 - 5.81 MIL/uL   Hemoglobin 14.6 13.0 - 17.0 g/dL   HCT 43.9 39.0 - 52.0 %   MCV 98.0 80.0 - 100.0 fL   MCH 32.6 26.0 - 34.0 pg   MCHC 33.3 30.0 - 36.0 g/dL   RDW 11.9 11.5 - 15.5 %   Platelets 216 150 - 400 K/uL   nRBC 0.0 0.0 - 0.2 %   Neutrophils Relative % 44 %   Neutro Abs 3.2 1.7 - 7.7 K/uL   Lymphocytes Relative 45 %   Lymphs Abs 3.3 0.7 - 4.0 K/uL   Monocytes Relative 10 %   Monocytes Absolute 0.7 0.1 - 1.0 K/uL   Eosinophils Relative 1 %   Eosinophils Absolute 0.1 0.0 - 0.5 K/uL   Basophils Relative 0 %   Basophils Absolute 0.0 0.0 - 0.1 K/uL   Immature Granulocytes 0 %   Abs Immature Granulocytes 0.01 0.00 - 0.07 K/uL  Basic metabolic panel  Result Value Ref Range   Sodium 141 135 - 145 mmol/L   Potassium 3.7 3.5 - 5.1 mmol/L   Chloride 101 98 - 111 mmol/L   CO2 28 22 - 32 mmol/L   Glucose, Bld 121 (H) 70 - 99 mg/dL   BUN 9 6 - 20 mg/dL   Creatinine, Ser 0.72 0.61 - 1.24 mg/dL   Calcium 8.8 (L) 8.9 -  10.3 mg/dL   GFR calc non Af Amer >60 >60 mL/min  GFR calc Af Amer >60 >60 mL/min   Anion gap 12 5 - 15  I-stat troponin, ED  Result Value Ref Range   Troponin i, poc 0.03 0.00 - 0.08 ng/mL   Comment 3           Dg Chest 2 View  Result Date: 02/16/2018 CLINICAL DATA:  Shortness of breath EXAM: CHEST - 2 VIEW COMPARISON:  07/11/2017 FINDINGS: Cardiac shadows within normal limits. Aortic calcifications are again seen. The lungs are well aerated bilaterally. No bony abnormality is seen. Old rib fractures are noted. IMPRESSION: No acute abnormality is seen Electronically Signed   By: Inez Catalina M.D.   On: 02/16/2018 01:14    EKG EKG Interpretation  Date/Time:  Thursday February 16 2018 00:05:40 EST Ventricular Rate:  104 PR Interval:    QRS Duration: 96 QT Interval:  371 QTC Calculation: 488 R Axis:   81 Text Interpretation:  Sinus tachycardia Anteroseptal infarct, old Confirmed by Randal Buba, Jahni Paul ([id number]) on 02/16/2018 12:30:38 AM   Radiology No results found.  Procedures Procedures (including critical care time)  Medications Ordered in ED Medications  albuterol (PROVENTIL,VENTOLIN) solution continuous neb (has no administration in time range)   MDM Reviewed: previous chart, nursing note and vitals Interpretation: labs, ECG and x-ray (No PNA on cxr negative troponin ) Total time providing critical care: 30-74 minutes (continuous neb). This excludes time spent performing separately reportable procedures and services. Consults: admitting MD  CRITICAL CARE Performed by: Manahil Vanzile K Maribel Hadley-Rasch Total critical care time: 45 minutes Critical care time was exclusive of separately billable procedures and treating other patients. Critical care was necessary to treat or prevent imminent or life-threatening deterioration. Critical care was time spent personally by me on the following activities: development of treatment plan with patient and/or surrogate as well as nursing,  discussions with consultants, evaluation of patient's response to treatment, examination of patient, obtaining history from patient or surrogate, ordering and performing treatments and interventions, ordering and review of laboratory studies, ordering and review of radiographic studies, pulse oximetry and re-evaluation of patient's condition.  Already given solumedrol 10 mg of albuterol and magnesium 2 grams en route, still hypoxic and wheezing on a continuous neb  Final Clinical Impressions(s) / ED Diagnoses   COPD will admit to medicine    Eian Vandervelden, MD 02/16/18 [place], Fantashia Shupert, MD 02/16/18 4742

## 2018-02-16 NOTE — H&P (Signed)
History and Physical    James [name] UDJ:[id number] DOB: 12/03/1960 DOA: 02/16/2018  PCP: Patient, No Pcp Per Patient coming from: Home  Chief Complaint: Shortness of breath  HPI: James [name] is a 58 y.o. male with medical history significant of COPD presenting to the hospital via EMS for evaluation of shortness of breath.  Patient states he was feeling weak and having a runny nose last week which he thought was due to the flu.  States he did not seek medical attention and was not prescribed any medications.  States for the past 2 days he has been very short of breath, wheezing, and coughing.  He ran out of his COPD inhalers a month ago.  He quit smoking 5 years ago.  Review of Systems: As per HPI otherwise 10 point review of systems negative.  Past Medical History:  Diagnosis Date  . COPD (chronic obstructive pulmonary disease) ([place])   . Pneumothorax    2016, fell from ladder    Past Surgical History:  Procedure Laterality Date  . VIDEO ASSISTED THORACOSCOPY (VATS)/THOROCOTOMY Left 06/11/2013   Procedure: VIDEO ASSISTED THORACOSCOPY (VATS)/THOROCOTOMY;  Surgeon: Ivin Poot, MD;  Location: [facility];  Service: Thoracic;  Laterality: Left;  [name] VIDEO BRONCHOSCOPY N/A 06/11/2013   Procedure: VIDEO BRONCHOSCOPY;  Surgeon: Ivin Poot, MD;  Location: [facility];  Service: Thoracic;  Laterality: N/A;     reports that he has quit smoking. His smoking use included cigarettes. He has a 60.00 pack-year smoking history. His smokeless tobacco use includes chew. He reports current alcohol use of about 21.0 standard drinks of alcohol per week. He reports current drug use. Drug: Marijuana.  No Known Allergies  Family History  Problem Relation Age of Onset  . Heart disease Father   . COPD Sister     Prior to Admission medications   Medication Sig Start Date End Date Taking? Authorizing Provider  albuterol (PROVENTIL HFA;VENTOLIN HFA) 108 (90 Base) MCG/ACT inhaler Inhale 1-2 puffs into the  lungs every 6 (six) hours as needed for wheezing or shortness of breath. 07/11/17   Fransico Meadow, PA-C  predniSONE (DELTASONE) 10 MG tablet 6 tablets on 1st day, 5. Tablets on second day, 4 on 3rd, 3 on 4th, 2 on 5th and 1 on 6th days 07/11/17   Fransico Meadow, PA-C  Tiotropium Bromide Monohydrate (SPIRIVA RESPIMAT) 1.25 MCG/ACT AERS Inhale 2 puffs into the lungs daily. 04/08/16   Velna Ochs, MD    Physical Exam: Vitals:   02/16/18 0145 02/16/18 0215 02/16/18 0245 02/16/18 0311  BP: (!) 113/58 (!) 118/52 (!) 130/47   Pulse: 97 (!) 104 98   Resp: (!) 21 (!) 24 (!) 24   Temp:      TempSrc:      SpO2: 100% 96% 91% 95%  Weight:      Height:        Physical Exam  Constitutional: He is oriented to person, place, and time. He appears well-developed and well-nourished. No distress.  HENT:  Head: Normocephalic.  Mouth/Throat: Oropharynx is clear and moist.  Eyes: Right eye exhibits no discharge. Left eye exhibits no discharge.  Neck: Neck supple.  Cardiovascular: Regular rhythm and intact distal pulses.  Slightly tachycardic  Pulmonary/Chest: He has wheezes. He has no rales.  Speaking clearly in full sentences Slightly increased work of breathing Diffuse end expiratory wheezing  Abdominal: Soft. Bowel sounds are normal. He exhibits no distension. There is no abdominal tenderness. There is no guarding.  Musculoskeletal:        General: No edema.  Neurological: He is alert and oriented to person, place, and time.  Skin: Skin is warm and dry. He is not diaphoretic.  Psychiatric: He has a normal mood and affect. His behavior is normal.     Labs on Admission: I have personally reviewed following labs and imaging studies  CBC: Recent Labs  Lab 02/16/18 0036  WBC 7.4  NEUTROABS 3.2  HGB 14.6  HCT 43.9  MCV 98.0  PLT 619   Basic Metabolic Panel: Recent Labs  Lab 02/16/18 0036  NA 141  K 3.7  CL 101  CO2 28  GLUCOSE 121*  BUN 9  CREATININE 0.72  CALCIUM 8.8*    GFR: Estimated Creatinine Clearance: 103.1 mL/min (by C-G formula based on SCr of 0.72 mg/dL). Liver Function Tests: No results for input(s): AST, ALT, ALKPHOS, BILITOT, PROT, ALBUMIN in the last 168 hours. No results for input(s): LIPASE, AMYLASE in the last 168 hours. No results for input(s): AMMONIA in the last 168 hours. Coagulation Profile: No results for input(s): INR, PROTIME in the last 168 hours. Cardiac Enzymes: No results for input(s): CKTOTAL, CKMB, CKMBINDEX, TROPONINI in the last 168 hours. BNP (last 3 results) No results for input(s): PROBNP in the last 8760 hours. HbA1C: No results for input(s): HGBA1C in the last 72 hours. CBG: No results for input(s): GLUCAP in the last 168 hours. Lipid Profile: No results for input(s): CHOL, HDL, LDLCALC, TRIG, CHOLHDL, LDLDIRECT in the last 72 hours. Thyroid Function Tests: No results for input(s): TSH, T4TOTAL, FREET4, T3FREE, THYROIDAB in the last 72 hours. Anemia Panel: No results for input(s): VITAMINB12, FOLATE, FERRITIN, TIBC, IRON, RETICCTPCT in the last 72 hours. Urine analysis:    Component Value Date/Time   COLORURINE YELLOW 04/06/2016 1307   APPEARANCEUR CLEAR 04/06/2016 1307   LABSPEC 1.012 04/06/2016 1307   PHURINE 5.0 04/06/2016 1307   GLUCOSEU >=500 (A) 04/06/2016 1307   HGBUR NEGATIVE 04/06/2016 [place] 04/06/2016 1307   KETONESUR NEGATIVE 04/06/2016 1307   PROTEINUR NEGATIVE 04/06/2016 1307   UROBILINOGEN 4.0 (H) 06/15/2013 1030   NITRITE NEGATIVE 04/06/2016 1307   LEUKOCYTESUR NEGATIVE 04/06/2016 1307    Radiological Exams on Admission: Dg Chest 2 View  Result Date: 02/16/2018 CLINICAL DATA:  Shortness of breath EXAM: CHEST - 2 VIEW COMPARISON:  07/11/2017 FINDINGS: Cardiac shadows within normal limits. Aortic calcifications are again seen. The lungs are well aerated bilaterally. No bony abnormality is seen. Old rib fractures are noted. IMPRESSION: No acute abnormality is seen  Electronically Signed   By: Inez Catalina M.D.   On: 02/16/2018 01:14    EKG: Independently reviewed.  Sinus tachycardia (heart rate 104), no significant change since prior tracing.  Assessment/Plan Principal Problem:   COPD with acute exacerbation (HCC) Active Problems:   Alcohol abuse   COPD exacerbation -Likely secondary to home inhaler nonadherence. -Wheezing.  Oxygen saturation 93 to 94% on room air at present. -Afebrile and no leukocytosis.  Chest x-ray showing no acute abnormality. -Received Solu-Medrol 125 mg and magnesium 2 g by EMS.  Continue Solu-Medrol 60 mg every 8 hours. -Azithromycin -DuoNebs every 6 hours -Albuterol nebulizer every 2 hours as needed -Pulmicort -Supplemental oxygen if needed  Alcohol abuse Patient reports drinking a sixpack of beer every night.  Last drink was 2/12 at 5 PM.  Slight tachycardia in the setting of receiving bronchodilator treatments.  No other signs of withdrawal. -CIWA protocol; Ativan PRN -Thiamine, multivitamin,  folate  DVT prophylaxis: Lovenox Code Status: Patient wishes to be full code. Family Communication: No family available. Disposition Plan: Anticipate discharge after clinical improvement. Consults called: None Admission status: Observation, telemetry   Shela Leff MD [facility] Pager [phone]  If 7PM-7AM, please contact night-coverage [email or web address] Password [facility]  02/16/2018, 3:31 AM

## 2018-02-16 NOTE — ED Triage Notes (Addendum)
  Patient BIB EMS for SOB that started last night.  Patient was diagnosed with flu last Thursday but did not take tamiflu.  Patient had SOB with exp wheezing in all fields.  Patient was given 10mg  alb, 0.5 atrovent, 125mg  solumedrol and 2g magnesium in route.  Patient is A&O x4.  Patient 90% on RA.  No pain just SOB.  HX COPD

## 2018-02-16 NOTE — ED Notes (Signed)
Respiratory at bedside.

## 2018-02-16 NOTE — Progress Notes (Signed)
Pt assessed to have tremors; sweating and HR in 120's but not yet time for prn ativan. NP on-call notified and new order received. Will continue to closely monitor. P.[employer] RN

## 2018-02-17 DIAGNOSIS — F10239 Alcohol dependence with withdrawal, unspecified: Secondary | ICD-10-CM

## 2018-02-17 DIAGNOSIS — J9621 Acute and chronic respiratory failure with hypoxia: Secondary | ICD-10-CM

## 2018-02-17 MED ORDER — TRAMADOL HCL 50 MG PO TABS
50.0000 mg | ORAL_TABLET | Freq: Four times a day (QID) | ORAL | Status: DC | PRN
  Administered 2018-02-17: 50 mg via ORAL
  Filled 2018-02-17: qty 1

## 2018-02-17 MED ORDER — SODIUM CHLORIDE 0.9 % IV SOLN
1.0000 g | INTRAVENOUS | Status: DC
  Administered 2018-02-17 – 2018-02-18 (×2): 1 g via INTRAVENOUS
  Filled 2018-02-17 (×2): qty 10

## 2018-02-17 MED ORDER — METHYLPREDNISOLONE SODIUM SUCC 125 MG IJ SOLR
60.0000 mg | Freq: Four times a day (QID) | INTRAMUSCULAR | Status: DC
  Administered 2018-02-17 – 2018-02-20 (×12): 60 mg via INTRAVENOUS
  Filled 2018-02-17 (×12): qty 2

## 2018-02-17 MED ORDER — LORAZEPAM 2 MG/ML IJ SOLN
0.0000 mg | Freq: Four times a day (QID) | INTRAMUSCULAR | Status: AC
  Administered 2018-02-17 – 2018-02-18 (×3): 2 mg via INTRAVENOUS
  Administered 2018-02-18: 1 mg via INTRAVENOUS
  Administered 2018-02-18 – 2018-02-19 (×2): 2 mg via INTRAVENOUS
  Filled 2018-02-17 (×6): qty 1

## 2018-02-17 MED ORDER — LORAZEPAM 2 MG/ML IJ SOLN
0.0000 mg | Freq: Two times a day (BID) | INTRAMUSCULAR | Status: AC
  Administered 2018-02-19 – 2018-02-20 (×2): 1 mg via INTRAVENOUS
  Filled 2018-02-17 (×3): qty 1

## 2018-02-17 NOTE — Progress Notes (Signed)
Report called to [place], RN on 2W. Patient does not want to call any family to notify them of transfer.

## 2018-02-17 NOTE — Progress Notes (Signed)
Progress Note    James [name]  TOI:[id number] DOB: May 13, 1960  DOA: 02/16/2018 PCP: Patient, No Pcp Per    Brief Narrative:     Medical records reviewed and are as summarized below:  James [name] is an 58 y.o. male with medical history significant of COPD presenting to the hospital via EMS for evaluation of shortness of breath.  Patient states he was feeling weak and having a runny nose last week which he thought was due to the flu.  States he did not seek medical attention and was not prescribed any medications.  States for the past 2 days he has been very short of breath, wheezing, and coughing.  He ran out of his COPD inhalers a month ago.  He quit smoking 5 years ago.  Assessment/Plan:   Principal Problem:   COPD with acute exacerbation (HCC) Active Problems:   Alcohol abuse  Acute COPD exacerbation with acute respiratory failure -Likely secondary to home inhaler nonadherence. -Received Solu-Medrol 125 mg and magnesium 2 g by EMS.  increase solumedrol dose -Azithromycin/rocephin -DuoNebs every 6 hours -Albuterol nebulizer every 2 hours as needed -Pulmicort -Supplemental oxygen--wean as tolerated  Alcohol abuse Patient reports drinking a six-pack of beer every night.  Last drink was 2/12 at 5 PM.   -CIWA-- scheduled and PRN -Thiamine, multivitamin, folate   Family Communication/Anticipated D/C date and plan/Code Status   DVT prophylaxis: Lovenox ordered. Code Status: Full Code.  Family Communication:  Disposition Plan: needs schedule ativan and PRN as well as continued IV steroids   Medical Consultants:    None.     Subjective:   coughing  Objective:    Vitals:   02/17/18 0107 02/17/18 0500 02/17/18 0821 02/17/18 0824  BP:  (!) 156/87    Pulse:  88    Resp:  20    Temp:  99.2 F (37.3 C)    TempSrc:  Oral    SpO2: 94% 94% (!) 89% 92%  Weight:      Height:        Intake/Output Summary (Last 24 hours) at 02/17/2018 0946 Last data filed  at 02/17/2018 0200 Gross per 24 hour  Intake -  Output 400 ml  Net -400 ml   Filed Weights   02/16/18 0008  Weight: 77.1 kg    Exam: In bed, sweaty appearing, tremulous rrr Wheezing throughout- on 2L O2, pursed lip breathing No LE edema  Data Reviewed:   I have personally reviewed following labs and imaging studies:  Labs: Labs show the following:   Basic Metabolic Panel: Recent Labs  Lab 02/16/18 0036  NA 141  K 3.7  CL 101  CO2 28  GLUCOSE 121*  BUN 9  CREATININE 0.72  CALCIUM 8.8*   GFR Estimated Creatinine Clearance: 103.1 mL/min (by C-G formula based on SCr of 0.72 mg/dL). Liver Function Tests: No results for input(s): AST, ALT, ALKPHOS, BILITOT, PROT, ALBUMIN in the last 168 hours. No results for input(s): LIPASE, AMYLASE in the last 168 hours. No results for input(s): AMMONIA in the last 168 hours. Coagulation profile No results for input(s): INR, PROTIME in the last 168 hours.  CBC: Recent Labs  Lab 02/16/18 0036  WBC 7.4  NEUTROABS 3.2  HGB 14.6  HCT 43.9  MCV 98.0  PLT 216   Cardiac Enzymes: No results for input(s): CKTOTAL, CKMB, CKMBINDEX, TROPONINI in the last 168 hours. BNP (last 3 results) No results for input(s): PROBNP in the last 8760 hours. CBG: No  results for input(s): GLUCAP in the last 168 hours. D-Dimer: No results for input(s): DDIMER in the last 72 hours. Hgb A1c: No results for input(s): HGBA1C in the last 72 hours. Lipid Profile: No results for input(s): CHOL, HDL, LDLCALC, TRIG, CHOLHDL, LDLDIRECT in the last 72 hours. Thyroid function studies: No results for input(s): TSH, T4TOTAL, T3FREE, THYROIDAB in the last 72 hours.  Invalid input(s): FREET3 Anemia work up: No results for input(s): VITAMINB12, FOLATE, FERRITIN, TIBC, IRON, RETICCTPCT in the last 72 hours. Sepsis Labs: Recent Labs  Lab 02/16/18 0036  WBC 7.4    Microbiology No results found for this or any previous visit (from the past 240  hour(s)).  Procedures and diagnostic studies:  Dg Chest 2 View  Result Date: 02/16/2018 CLINICAL DATA:  Shortness of breath EXAM: CHEST - 2 VIEW COMPARISON:  07/11/2017 FINDINGS: Cardiac shadows within normal limits. Aortic calcifications are again seen. The lungs are well aerated bilaterally. No bony abnormality is seen. Old rib fractures are noted. IMPRESSION: No acute abnormality is seen Electronically Signed   By: Inez Catalina M.D.   On: 02/16/2018 01:14    Medications:   . budesonide (PULMICORT) nebulizer solution  0.5 mg Nebulization BID  . enoxaparin (LOVENOX) injection  40 mg Subcutaneous Q24H  . folic acid  1 mg Oral Daily  . ipratropium-albuterol  3 mL Nebulization Q6H  . LORazepam  0-4 mg Intravenous Q6H   Followed by  . [START ON 02/19/2018] LORazepam  0-4 mg Intravenous Q12H  . methylPREDNISolone (SOLU-MEDROL) injection  60 mg Intravenous Q8H  . multivitamin with minerals  1 tablet Oral Daily  . thiamine  100 mg Oral Daily   Or  . thiamine  100 mg Intravenous Daily   Continuous Infusions: . azithromycin 250 mg (02/17/18 0500)  . cefTRIAXone (ROCEPHIN)  IV       LOS: 0 days   Geradine Girt  [facility]   How to contact the [facility] Attending or Consulting provider [place] or covering provider during after hours [place], for this patient?  1. Check the care team in [facility] and look for a) attending/consulting [facility] provider listed and b) the [facility] team listed 2. Log into [email or web address] and use [place]'s universal password to access. If you do not have the password, please contact the hospital operator. 3. Locate the [facility] provider you are looking for under [facility] and page to a number that you can be directly reached. 4. If you still have difficulty reaching the provider, please page the [facility] (Director on Call) for the Hospitalists listed on amion for assistance.  02/17/2018, 9:46 AM

## 2018-02-17 NOTE — Care Management Note (Signed)
Case Management Note  Patient Details  Name: James [name] MRN: [id number] Date of Birth: [birth year]  Subjective/Objective:  58 yo male presented with SOB, wheezing and coughing; PMH: COPD                 Action/Plan: CM met with patient to discuss dispositional needs. Patient states living at home alone and being independent with his ADLs PTA. Patient has no established PCP, nor active health insurance, but agreeable to CM arranging. Hospital f/u appointment to est a new PCP is arranged for: [facility] on 03/14/18 @ 0850; [facility] for his prescription needs for discounted medications for $4-$10; AVS updated. CM team will continue to follow.   Expected Discharge Date:                  Expected Discharge Plan:  Home/Self Care  In-House Referral:  NA  Discharge planning Services  CM Consult, Medication Assistance, Follow-up appt scheduled, [facility]  Post Acute Care Choice:  NA Choice offered to:  NA  DME Arranged:  N/A DME Agency:  NA  HH Arranged:  NA HH Agency:  NA  Status of Service:  In process, will continue to follow  If discussed at Long Length of Stay Meetings, dates discussed:    Additional Comments:  Midge Minium RN, BSN, NCM-BC, ACM-RN [phone] 02/17/2018, 1:55 PM

## 2018-02-18 LAB — CBC
HCT: 42.4 % (ref 39.0–52.0)
Hemoglobin: 14 g/dL (ref 13.0–17.0)
MCH: 32.8 pg (ref 26.0–34.0)
MCHC: 33 g/dL (ref 30.0–36.0)
MCV: 99.3 fL (ref 80.0–100.0)
PLATELETS: 282 10*3/uL (ref 150–400)
RBC: 4.27 MIL/uL (ref 4.22–5.81)
RDW: 12.2 % (ref 11.5–15.5)
WBC: 14.2 10*3/uL — ABNORMAL HIGH (ref 4.0–10.5)
nRBC: 0 % (ref 0.0–0.2)

## 2018-02-18 LAB — BASIC METABOLIC PANEL
Anion gap: 12 (ref 5–15)
BUN: 17 mg/dL (ref 6–20)
CO2: 28 mmol/L (ref 22–32)
Calcium: 9.2 mg/dL (ref 8.9–10.3)
Chloride: 101 mmol/L (ref 98–111)
Creatinine, Ser: 0.81 mg/dL (ref 0.61–1.24)
GFR calc Af Amer: >60 mL/min (ref >60)
GFR calc non Af Amer: >60 mL/min (ref >60)
Glucose, Bld: 166 mg/dL — ABNORMAL HIGH (ref 70–99)
Potassium: 4.3 mmol/L (ref 3.5–5.1)
Sodium: 141 mmol/L (ref 135–145)

## 2018-02-18 MED ORDER — AZITHROMYCIN 250 MG PO TABS
250.0000 mg | ORAL_TABLET | Freq: Every day | ORAL | Status: DC
  Administered 2018-02-19 – 2018-02-21 (×3): 250 mg via ORAL
  Filled 2018-02-18 (×3): qty 1

## 2018-02-18 NOTE — Progress Notes (Addendum)
PROGRESS NOTE    James [name]  QVZ:[id number] DOB: 11/11/1960 DOA: 02/16/2018 PCP: Patient, No Pcp Per     Brief Narrative:  James [name] is a 58 y.o. male with medical history significant ofCOPD presenting to the hospital via EMS for evaluation of shortness of breath.Patient states he was feeling weak and havingarunny nose last week which he thought was due to the flu. States he did not seek medical attention and was not prescribed any medications. States for the past 2 days he has been very short of breath, wheezing, and coughing. He ran out of his COPD inhalers a month ago. He quit smoking 5 years ago.  New events last 24 hours / Subjective: No acute events overnight.  Patient sitting in bed, eating breakfast, quite dyspneic at rest.  Assessment & Plan:   Principal Problem:   COPD with acute exacerbation ([place]) Active Problems:   Acute on chronic respiratory failure with hypoxia (HCC)   Alcohol abuse   Acute COPD exacerbation -Chest x-ray independently reviewed, lungs are hyperinflated without acute pulmonary process -Continue IV Solu-Medrol, azithromycin, duo nebs  Acute hypoxemic respiratory failure -Continue nasal cannula O2 to maintain sat greater than 88%, wean to room air as able  Leukocytosis -Secondary to steroid use, continue to monitor  Alcohol abuse -Patient reports drinking a sixpack of beer every night, last drink 2/12 at 5 PM -Continue CIWA scale   DVT prophylaxis: Lovenox Code Status: Full code Family Communication: No family at bedside Disposition Plan: Pending further improvement in respiratory status   Consultants:   None  Procedures:   None  Antimicrobials:  Anti-infectives (From admission, onward)   Start     Dose/Rate Route Frequency Ordered Stop   02/19/18 1000  azithromycin (ZITHROMAX) tablet 250 mg     250 mg Oral Daily 02/18/18 1039     02/17/18 0830  cefTRIAXone (ROCEPHIN) 1 g in sodium chloride 0.9 % 100 mL IVPB     1  g 200 mL/hr over 30 Minutes Intravenous Every 24 hours 02/17/18 0804     02/17/18 0400  azithromycin (ZITHROMAX) 250 mg in dextrose 5 % 125 mL IVPB  Status:  Discontinued     250 mg 125 mL/hr over 60 Minutes Intravenous Every 24 hours 02/16/18 0329 02/18/18 1039   02/16/18 0400  cefTRIAXone (ROCEPHIN) 1 g in sodium chloride 0.9 % 100 mL IVPB  Status:  Discontinued     1 g 200 mL/hr over 30 Minutes Intravenous Every 24 hours 02/16/18 0255 02/16/18 0327   02/16/18 0330  azithromycin (ZITHROMAX) 500 mg in sodium chloride 0.9 % 250 mL IVPB     500 mg 250 mL/hr over 60 Minutes Intravenous  Once 02/16/18 0328 02/16/18 0536        Objective: Vitals:   02/18/18 0303 02/18/18 0600 02/18/18 0743 02/18/18 0805  BP:  (!) 157/92  (!) 151/96  Pulse:  84  (!) 110  Resp:    20  Temp:    97.7 F (36.5 C)  TempSrc:    Oral  SpO2: 92%  94% 95%  Weight:      Height:        Intake/Output Summary (Last 24 hours) at 02/18/2018 1256 Last data filed at 02/17/2018 1500 Gross per 24 hour  Intake 340 ml  Output -  Net 340 ml   Filed Weights   02/16/18 0008  Weight: 77.1 kg    Examination:  General exam: Appears calm  Respiratory system: Diffuse wheezes bilaterally, conversational  dyspnea, and remains on nasal cannula O2 Cardiovascular system: S1 & S2 heard, tachycardic, regular rhythm, rate in the 110. No JVD, murmurs, rubs, gallops or clicks. No pedal edema. Gastrointestinal system: Abdomen is nondistended, soft and nontender. No organomegaly or masses felt. Normal bowel sounds heard. Central nervous system: Alert and oriented. No focal neurological deficits. Extremities: Symmetric 5 x 5 power. Skin: No rashes, lesions or ulcers Psychiatry: Judgement and insight appear normal. Mood & affect appropriate.   Data Reviewed: I have personally reviewed following labs and imaging studies  CBC: Recent Labs  Lab 02/16/18 0036 02/18/18 0228  WBC 7.4 14.2*  NEUTROABS 3.2  --   HGB 14.6 14.0    HCT 43.9 42.4  MCV 98.0 99.3  PLT 216 277   Basic Metabolic Panel: Recent Labs  Lab 02/16/18 0036 02/18/18 0228  NA 141 141  K 3.7 4.3  CL 101 101  CO2 28 28  GLUCOSE 121* 166*  BUN 9 17  CREATININE 0.72 0.81  CALCIUM 8.8* 9.2   GFR: Estimated Creatinine Clearance: 101.8 mL/min (by C-G formula based on SCr of 0.81 mg/dL). Liver Function Tests: No results for input(s): AST, ALT, ALKPHOS, BILITOT, PROT, ALBUMIN in the last 168 hours. No results for input(s): LIPASE, AMYLASE in the last 168 hours. No results for input(s): AMMONIA in the last 168 hours. Coagulation Profile: No results for input(s): INR, PROTIME in the last 168 hours. Cardiac Enzymes: No results for input(s): CKTOTAL, CKMB, CKMBINDEX, TROPONINI in the last 168 hours. BNP (last 3 results) No results for input(s): PROBNP in the last 8760 hours. HbA1C: No results for input(s): HGBA1C in the last 72 hours. CBG: No results for input(s): GLUCAP in the last 168 hours. Lipid Profile: No results for input(s): CHOL, HDL, LDLCALC, TRIG, CHOLHDL, LDLDIRECT in the last 72 hours. Thyroid Function Tests: No results for input(s): TSH, T4TOTAL, FREET4, T3FREE, THYROIDAB in the last 72 hours. Anemia Panel: No results for input(s): VITAMINB12, FOLATE, FERRITIN, TIBC, IRON, RETICCTPCT in the last 72 hours. Sepsis Labs: No results for input(s): PROCALCITON, LATICACIDVEN in the last 168 hours.  No results found for this or any previous visit (from the past 240 hour(s)).     Radiology Studies: No results found.    Scheduled Meds: . [START ON 02/19/2018] azithromycin  250 mg Oral Daily  . budesonide (PULMICORT) nebulizer solution  0.5 mg Nebulization BID  . enoxaparin (LOVENOX) injection  40 mg Subcutaneous Q24H  . folic acid  1 mg Oral Daily  . ipratropium-albuterol  3 mL Nebulization Q6H  . LORazepam  0-4 mg Intravenous Q6H   Followed by  . [START ON 02/19/2018] LORazepam  0-4 mg Intravenous Q12H  .  methylPREDNISolone (SOLU-MEDROL) injection  60 mg Intravenous Q6H  . multivitamin with minerals  1 tablet Oral Daily  . thiamine  100 mg Oral Daily   Or  . thiamine  100 mg Intravenous Daily   Continuous Infusions: . cefTRIAXone (ROCEPHIN)  IV 1 g (02/18/18 1016)     LOS: 1 day    Time spent: 35 minutes   Dessa Phi, DO [facility] [email or web address] 02/18/2018, 12:56 PM

## 2018-02-18 NOTE — Plan of Care (Signed)
  Problem: Education: Goal: Knowledge of General Education information will improve Description: Including pain rating scale, medication(s)/side effects and non-pharmacologic comfort measures Outcome: Progressing   Problem: Activity: Goal: Risk for activity intolerance will decrease Outcome: Progressing   Problem: Nutrition: Goal: Adequate nutrition will be maintained Outcome: Progressing   Problem: Pain Managment: Goal: General experience of comfort will improve Outcome: Progressing   

## 2018-02-18 NOTE — Progress Notes (Signed)
PHARMACIST - PHYSICIAN COMMUNICATION DR:   Eliseo Squires CONCERNING: Antibiotic IV to Oral Route Change Policy  RECOMMENDATION: This patient is receiving azithromycin by the intravenous route.  Based on criteria approved by the Pharmacy and Therapeutics Committee, the antibiotic(s) is/are being converted to the equivalent oral dose form(s).   DESCRIPTION: These criteria include:  Patient being treated for a respiratory tract infection, urinary tract infection, cellulitis or clostridium difficile associated diarrhea if on metronidazole  The patient is not neutropenic and does not exhibit a GI malabsorption state  The patient is eating (either orally or via tube) and/or has been taking other orally administered medications for a least 24 hours  The patient is improving clinically and has a Tmax < 100.5  If you have questions about this conversion, please contact the Pharmacy Department  []   [phone] )  [facility] []   [phone] )  [facility] [x]   [phone] )  [facility] []   [phone] )  [facility] []   [phone] )  [place], PharmD, [place], AAHIVP, CPP Infectious Disease Pharmacist 02/18/2018 10:40 AM

## 2018-02-19 LAB — CBC
HCT: 43.8 % (ref 39.0–52.0)
Hemoglobin: 14.4 g/dL (ref 13.0–17.0)
MCH: 32.7 pg (ref 26.0–34.0)
MCHC: 32.9 g/dL (ref 30.0–36.0)
MCV: 99.5 fL (ref 80.0–100.0)
Platelets: 289 10*3/uL (ref 150–400)
RBC: 4.4 MIL/uL (ref 4.22–5.81)
RDW: 12.3 % (ref 11.5–15.5)
WBC: 14.7 10*3/uL — ABNORMAL HIGH (ref 4.0–10.5)
nRBC: 0 % (ref 0.0–0.2)

## 2018-02-19 MED ORDER — IPRATROPIUM-ALBUTEROL 0.5-2.5 (3) MG/3ML IN SOLN
3.0000 mL | Freq: Two times a day (BID) | RESPIRATORY_TRACT | Status: DC
  Administered 2018-02-19 – 2018-02-20 (×2): 3 mL via RESPIRATORY_TRACT
  Filled 2018-02-19 (×2): qty 3

## 2018-02-19 MED ORDER — MOMETASONE FURO-FORMOTEROL FUM 200-5 MCG/ACT IN AERO
2.0000 | INHALATION_SPRAY | Freq: Two times a day (BID) | RESPIRATORY_TRACT | Status: DC
  Administered 2018-02-19 – 2018-02-21 (×5): 2 via RESPIRATORY_TRACT
  Filled 2018-02-19: qty 8.8

## 2018-02-19 NOTE — Progress Notes (Signed)
PROGRESS NOTE    James [name]  [id number]:[id number] DOB: 14-Sep-1960 DOA: 02/16/2018 PCP: Patient, No Pcp Per     Brief Narrative:  James [name] is a 58 y.o. male with medical history significant ofCOPD presenting to the hospital via EMS for evaluation of shortness of breath.Patient states he was feeling weak and havingarunny nose last week which he thought was due to the flu. States he did not seek medical attention and was not prescribed any medications. States for the past 2 days he has been very short of breath, wheezing, and coughing. He ran out of his COPD inhalers a month ago. He quit smoking 5 years ago.  New events last 24 hours / Subjective: No complaints, continues to have purse-lip breathing, eating breakfast.   Assessment & Plan:   Principal Problem:   COPD with acute exacerbation (HCC) Active Problems:   Acute on chronic respiratory failure with hypoxia (HCC)   Alcohol abuse   Acute COPD exacerbation -Chest x-ray independently reviewed, lungs are hyperinflated without acute pulmonary process -Continue IV Solu-Medrol, azithromycin, duo nebs  Acute hypoxemic respiratory failure -Continue nasal cannula O2 to maintain sat greater than 88%, wean to room air as able  Leukocytosis -Secondary to steroid use, continue to monitor  Alcohol abuse -Patient reports drinking a sixpack of beer every night, last drink 2/12 at 5 PM -Continue CIWA scale   DVT prophylaxis: Lovenox Code Status: Full code Family Communication: No family at bedside Disposition Plan: Pending further improvement in respiratory status   Consultants:   None  Procedures:   None  Antimicrobials:  Anti-infectives (From admission, onward)   Start     Dose/Rate Route Frequency Ordered Stop   02/19/18 1000  azithromycin (ZITHROMAX) tablet 250 mg     250 mg Oral Daily 02/18/18 1039     02/17/18 0830  cefTRIAXone (ROCEPHIN) 1 g in sodium chloride 0.9 % 100 mL IVPB  Status:  Discontinued     1 g 200 mL/hr over 30 Minutes Intravenous Every 24 hours 02/17/18 0804 02/18/18 1303   02/17/18 0400  azithromycin (ZITHROMAX) 250 mg in dextrose 5 % 125 mL IVPB  Status:  Discontinued     250 mg 125 mL/hr over 60 Minutes Intravenous Every 24 hours 02/16/18 0329 02/18/18 1039   02/16/18 0400  cefTRIAXone (ROCEPHIN) 1 g in sodium chloride 0.9 % 100 mL IVPB  Status:  Discontinued     1 g 200 mL/hr over 30 Minutes Intravenous Every 24 hours 02/16/18 0255 02/16/18 0327   02/16/18 0330  azithromycin (ZITHROMAX) 500 mg in sodium chloride 0.9 % 250 mL IVPB     500 mg 250 mL/hr over 60 Minutes Intravenous  Once 02/16/18 0328 02/16/18 0536       Objective: Vitals:   02/19/18 0203 02/19/18 0728 02/19/18 0731 02/19/18 1000  BP:  (!) 168/97    Pulse:  89    Resp:  17    Temp:  98 F (36.7 C)    TempSrc:  Oral    SpO2: 90% 96% 96% 94%  Weight:      Height:        Intake/Output Summary (Last 24 hours) at 02/19/2018 1015 Last data filed at 02/18/2018 2050 Gross per 24 hour  Intake 240 ml  Output -  Net 240 ml   Filed Weights   02/16/18 0008  Weight: 77.1 kg    Examination: General exam: Appears calm and comfortable  Respiratory system: Diffuse wheezes bilaterally  Cardiovascular system: S1 &  S2 heard, tachycardic regular rhythm  Gastrointestinal system: Abdomen is nondistended, soft and nontender. No organomegaly or masses felt. Normal bowel sounds heard. Central nervous system: Alert and oriented. No focal neurological deficits. Extremities: Symmetric 5 x 5 power. Skin: No rashes, lesions or ulcers Psychiatry: Judgement and insight appear normal. Mood & affect appropriate.    Data Reviewed: I have personally reviewed following labs and imaging studies  CBC: Recent Labs  Lab 02/16/18 0036 02/18/18 0228 02/19/18 0231  WBC 7.4 14.2* 14.7*  NEUTROABS 3.2  --   --   HGB 14.6 14.0 14.4  HCT 43.9 42.4 43.8  MCV 98.0 99.3 99.5  PLT 216 282 681   Basic Metabolic  Panel: Recent Labs  Lab 02/16/18 0036 02/18/18 0228  NA 141 141  K 3.7 4.3  CL 101 101  CO2 28 28  GLUCOSE 121* 166*  BUN 9 17  CREATININE 0.72 0.81  CALCIUM 8.8* 9.2   GFR: Estimated Creatinine Clearance: 101.8 mL/min (by C-G formula based on SCr of 0.81 mg/dL). Liver Function Tests: No results for input(s): AST, ALT, ALKPHOS, BILITOT, PROT, ALBUMIN in the last 168 hours. No results for input(s): LIPASE, AMYLASE in the last 168 hours. No results for input(s): AMMONIA in the last 168 hours. Coagulation Profile: No results for input(s): INR, PROTIME in the last 168 hours. Cardiac Enzymes: No results for input(s): CKTOTAL, CKMB, CKMBINDEX, TROPONINI in the last 168 hours. BNP (last 3 results) No results for input(s): PROBNP in the last 8760 hours. HbA1C: No results for input(s): HGBA1C in the last 72 hours. CBG: No results for input(s): GLUCAP in the last 168 hours. Lipid Profile: No results for input(s): CHOL, HDL, LDLCALC, TRIG, CHOLHDL, LDLDIRECT in the last 72 hours. Thyroid Function Tests: No results for input(s): TSH, T4TOTAL, FREET4, T3FREE, THYROIDAB in the last 72 hours. Anemia Panel: No results for input(s): VITAMINB12, FOLATE, FERRITIN, TIBC, IRON, RETICCTPCT in the last 72 hours. Sepsis Labs: No results for input(s): PROCALCITON, LATICACIDVEN in the last 168 hours.  No results found for this or any previous visit (from the past 240 hour(s)).     Radiology Studies: No results found.    Scheduled Meds: . azithromycin  250 mg Oral Daily  . budesonide (PULMICORT) nebulizer solution  0.5 mg Nebulization BID  . enoxaparin (LOVENOX) injection  40 mg Subcutaneous Q24H  . folic acid  1 mg Oral Daily  . ipratropium-albuterol  3 mL Nebulization Q6H  . LORazepam  0-4 mg Intravenous Q12H  . methylPREDNISolone (SOLU-MEDROL) injection  60 mg Intravenous Q6H  . multivitamin with minerals  1 tablet Oral Daily  . thiamine  100 mg Oral Daily   Or  . thiamine  100 mg  Intravenous Daily   Continuous Infusions:    LOS: 2 days    Time spent: 20 minutes   Dessa Phi, DO [facility] [email or web address] 02/19/2018, 10:15 AM

## 2018-02-20 LAB — CBC
HEMATOCRIT: 43.9 % (ref 39.0–52.0)
Hemoglobin: 14.4 g/dL (ref 13.0–17.0)
MCH: 32.4 pg (ref 26.0–34.0)
MCHC: 32.8 g/dL (ref 30.0–36.0)
MCV: 98.9 fL (ref 80.0–100.0)
Platelets: 290 10*3/uL (ref 150–400)
RBC: 4.44 MIL/uL (ref 4.22–5.81)
RDW: 12.2 % (ref 11.5–15.5)
WBC: 14.9 10*3/uL — ABNORMAL HIGH (ref 4.0–10.5)
nRBC: 0 % (ref 0.0–0.2)

## 2018-02-20 MED ORDER — METHYLPREDNISOLONE SODIUM SUCC 125 MG IJ SOLR
60.0000 mg | Freq: Four times a day (QID) | INTRAMUSCULAR | Status: AC
  Administered 2018-02-20 (×2): 60 mg via INTRAVENOUS
  Filled 2018-02-20 (×2): qty 2

## 2018-02-20 MED ORDER — LABETALOL HCL 5 MG/ML IV SOLN: 5.0000 mg | INTRAVENOUS | Status: DC | PRN

## 2018-02-20 MED ORDER — HYDRALAZINE HCL 20 MG/ML IJ SOLN
5.0000 mg | INTRAMUSCULAR | Status: DC | PRN
  Administered 2018-02-20 (×2): 5 mg via INTRAVENOUS
  Filled 2018-02-20 (×2): qty 1

## 2018-02-20 MED ORDER — PREDNISONE 20 MG PO TABS
50.0000 mg | ORAL_TABLET | Freq: Every day | ORAL | Status: DC
  Administered 2018-02-21: 50 mg via ORAL
  Filled 2018-02-20: qty 2

## 2018-02-20 NOTE — Progress Notes (Signed)
PROGRESS NOTE    James [name]  [id number] DOB: 09/02/1960 DOA: 02/16/2018 PCP: Patient, No Pcp Per     Brief Narrative:  James [name] is a 58 y.o. male with medical history significant ofCOPD presenting to the hospital via EMS for evaluation of shortness of breath.Patient states he was feeling weak and havingarunny nose last week which he thought was due to the flu. States he did not seek medical attention and was not prescribed any medications. States for the past 2 days he has been very short of breath, wheezing, and coughing. He ran out of his COPD inhalers a month ago. He quit smoking 5 years ago.  New events last 24 hours / Subjective: No new issues overnight, remains on O2, quite dyspneic with eating breakfast  Assessment & Plan:   Principal Problem:   COPD with acute exacerbation ([place]) Active Problems:   Acute on chronic respiratory failure with hypoxia (HCC)   Alcohol abuse   Acute COPD exacerbation -Chest x-ray independently reviewed, lungs are hyperinflated without acute pulmonary process -Continue IV Solu-Medrol, azithromycin, duo nebs.  Plan for another day of IV Solu-Medrol, transition to prednisone tomorrow morning.  Acute hypoxemic respiratory failure -Continue nasal cannula O2 to maintain sat greater than 88%, wean to room air as able.  [place]at screen ordered to evaluate for home oxygen needs  Leukocytosis -Secondary to steroid use, continue to monitor  Alcohol abuse -Patient reports drinking a sixpack of beer every night, last drink 2/12 at 5 PM -CIWA    DVT prophylaxis: Lovenox Code Status: Full code Family Communication: No family at bedside Disposition Plan: Pending further improvement in respiratory status   Consultants:   None  Procedures:   None  Antimicrobials:  Anti-infectives (From admission, onward)   Start     Dose/Rate Route Frequency Ordered Stop   02/19/18 1000  azithromycin (ZITHROMAX) tablet 250 mg     250 mg Oral  Daily 02/18/18 1039     02/17/18 0830  cefTRIAXone (ROCEPHIN) 1 g in sodium chloride 0.9 % 100 mL IVPB  Status:  Discontinued     1 g 200 mL/hr over 30 Minutes Intravenous Every 24 hours 02/17/18 0804 02/18/18 1303   02/17/18 0400  azithromycin (ZITHROMAX) 250 mg in dextrose 5 % 125 mL IVPB  Status:  Discontinued     250 mg 125 mL/hr over 60 Minutes Intravenous Every 24 hours 02/16/18 0329 02/18/18 1039   02/16/18 0400  cefTRIAXone (ROCEPHIN) 1 g in sodium chloride 0.9 % 100 mL IVPB  Status:  Discontinued     1 g 200 mL/hr over 30 Minutes Intravenous Every 24 hours 02/16/18 0255 02/16/18 0327   02/16/18 0330  azithromycin (ZITHROMAX) 500 mg in sodium chloride 0.9 % 250 mL IVPB     500 mg 250 mL/hr over 60 Minutes Intravenous  Once 02/16/18 0328 02/16/18 0536       Objective: Vitals:   02/19/18 1919 02/19/18 2340 02/20/18 0722 02/20/18 0736  BP:  (!) 141/87 (!) 172/107   Pulse:  89 94   Resp:  19 16   Temp:  98.3 F (36.8 C) 98.7 F (37.1 C)   TempSrc:  Oral Oral   SpO2: 94% 95% 96% 97%  Weight:      Height:       No intake or output data in the 24 hours ending 02/20/18 0951 Filed Weights   02/16/18 0008  Weight: 77.1 kg    Examination: General exam: Appears calm and comfortable  Respiratory  system: Less wheezing than yesterday, diminished breath sounds diffusely, +purse lip breathing  Cardiovascular system: S1 & S2 heard, RRR. No JVD, murmurs, rubs, gallops or clicks. No pedal edema. Gastrointestinal system: Abdomen is nondistended, soft and nontender. No organomegaly or masses felt. Normal bowel sounds heard. Central nervous system: Alert and oriented. No focal neurological deficits. Extremities: Symmetric 5 x 5 power. Skin: No rashes, lesions or ulcers Psychiatry: Judgement and insight appear normal. Mood & affect appropriate.    Data Reviewed: I have personally reviewed following labs and imaging studies  CBC: Recent Labs  Lab 02/16/18 0036 02/18/18 0228  02/19/18 0231 02/20/18 0213  WBC 7.4 14.2* 14.7* 14.9*  NEUTROABS 3.2  --   --   --   HGB 14.6 14.0 14.4 14.4  HCT 43.9 42.4 43.8 43.9  MCV 98.0 99.3 99.5 98.9  PLT 216 282 289 462   Basic Metabolic Panel: Recent Labs  Lab 02/16/18 0036 02/18/18 0228  NA 141 141  K 3.7 4.3  CL 101 101  CO2 28 28  GLUCOSE 121* 166*  BUN 9 17  CREATININE 0.72 0.81  CALCIUM 8.8* 9.2   GFR: Estimated Creatinine Clearance: 101.8 mL/min (by C-G formula based on SCr of 0.81 mg/dL). Liver Function Tests: No results for input(s): AST, ALT, ALKPHOS, BILITOT, PROT, ALBUMIN in the last 168 hours. No results for input(s): LIPASE, AMYLASE in the last 168 hours. No results for input(s): AMMONIA in the last 168 hours. Coagulation Profile: No results for input(s): INR, PROTIME in the last 168 hours. Cardiac Enzymes: No results for input(s): CKTOTAL, CKMB, CKMBINDEX, TROPONINI in the last 168 hours. BNP (last 3 results) No results for input(s): PROBNP in the last 8760 hours. HbA1C: No results for input(s): HGBA1C in the last 72 hours. CBG: No results for input(s): GLUCAP in the last 168 hours. Lipid Profile: No results for input(s): CHOL, HDL, LDLCALC, TRIG, CHOLHDL, LDLDIRECT in the last 72 hours. Thyroid Function Tests: No results for input(s): TSH, T4TOTAL, FREET4, T3FREE, THYROIDAB in the last 72 hours. Anemia Panel: No results for input(s): VITAMINB12, FOLATE, FERRITIN, TIBC, IRON, RETICCTPCT in the last 72 hours. Sepsis Labs: No results for input(s): PROCALCITON, LATICACIDVEN in the last 168 hours.  No results found for this or any previous visit (from the past 240 hour(s)).     Radiology Studies: No results found.    Scheduled Meds: . azithromycin  250 mg Oral Daily  . enoxaparin (LOVENOX) injection  40 mg Subcutaneous Q24H  . folic acid  1 mg Oral Daily  . ipratropium-albuterol  3 mL Nebulization BID  . LORazepam  0-4 mg Intravenous Q12H  . methylPREDNISolone (SOLU-MEDROL)  injection  60 mg Intravenous Q6H  . mometasone-formoterol  2 puff Inhalation BID  . multivitamin with minerals  1 tablet Oral Daily  . thiamine  100 mg Oral Daily   Or  . thiamine  100 mg Intravenous Daily   Continuous Infusions:    LOS: 3 days    Time spent: 20 minutes   Dessa Phi, DO [facility] [email or web address] 02/20/2018, 9:51 AM

## 2018-02-20 NOTE — Evaluation (Signed)
Physical Therapy Evaluation Patient Details Name: James [name] MRN: [id number] DOB: 02/09/1960 Today's Date: 02/20/2018   History of Present Illness  58 y.o. male with medical history significant of COPD presenting to the hospital via EMS for evaluation of shortness of breath.     Clinical Impression  Pt admitted with above diagnosis. Pt currently with functional limitations due to the deficits listed below (see PT Problem List). PTA pt lived at home alone, independent. On eval he required min guard assist transfers and min guard assist ambulation 150 feet without AD. Pt on RA with SpO2 93% at rest and 91% during mobility. Pt will benefit from skilled PT to increase their independence and safety with mobility to allow discharge to the venue listed below.  PT to follow acutely. No follow up services indicated.     Follow Up Recommendations No PT follow up;Supervision - Intermittent    Equipment Recommendations  None recommended by PT    Recommendations for Other Services       Precautions / Restrictions Precautions Precautions: Fall;Other (comment) Precaution Comments: watch sats Restrictions Weight Bearing Restrictions: No      Mobility  Bed Mobility Overal bed mobility: Modified Independent                Transfers Overall transfer level: Needs assistance Equipment used: None Transfers: Sit to/from Stand;Stand Pivot Transfers Sit to Stand: Min guard Stand pivot transfers: Min guard       General transfer comment: min guard for safety  Ambulation/Gait Ambulation/Gait assistance: Min guard Gait Distance (Feet): 150 Feet Assistive device: None Gait Pattern/deviations: Step-through pattern;Decreased stride length Gait velocity: decreased Gait velocity interpretation: <1.31 ft/sec, indicative of household ambulator General Gait Details: Pt ambulated on RA. SpO2 at rest 93% and 91% during mobility. Mildly unsteady. Fatigues quickly  Marine scientist Rankin (Stroke Patients Only)       Balance Overall balance assessment: Needs assistance Sitting-balance support: No upper extremity supported;Feet supported Sitting balance-Leahy Scale: Good     Standing balance support: No upper extremity supported;During functional activity Standing balance-Leahy Scale: Fair                               Pertinent Vitals/Pain Pain Assessment: No/denies pain    Home Living Family/patient expects to be discharged to:: Private residence Living Arrangements: Alone Available Help at Discharge: Family;Friend(s);Available PRN/intermittently Type of Home: House Home Access: Stairs to enter Entrance Stairs-Rails: None Entrance Stairs-Number of Steps: 2 Home Layout: One level Home Equipment: None      Prior Function Level of Independence: Independent               Hand Dominance   Dominant Hand: Right    Extremity/Trunk Assessment   Upper Extremity Assessment Upper Extremity Assessment: Overall WFL for tasks assessed    Lower Extremity Assessment Lower Extremity Assessment: Overall WFL for tasks assessed       Communication   Communication: No difficulties  Cognition Arousal/Alertness: Awake/alert Behavior During Therapy: WFL for tasks assessed/performed Overall Cognitive Status: Within Functional Limits for tasks assessed                                        General Comments      Exercises  Assessment/Plan    PT Assessment Patient needs continued PT services  PT Problem List Decreased balance;Decreased mobility;Cardiopulmonary status limiting activity;Decreased activity tolerance       PT Treatment Interventions Functional mobility training;Balance training;Patient/family education;Gait training;Therapeutic activities;Stair training;Therapeutic exercise    PT Goals (Current goals can be found in the Care Plan section)  Acute Rehab PT  Goals Patient Stated Goal: home PT Goal Formulation: With patient Time For Goal Achievement: 03/06/18 Potential to Achieve Goals: Good    Frequency Min 3X/week   Barriers to discharge        Co-evaluation               AM-PAC PT "6 Clicks" Mobility  Outcome Measure Help needed turning from your back to your side while in a flat bed without using bedrails?: None Help needed moving from lying on your back to sitting on the side of a flat bed without using bedrails?: None Help needed moving to and from a bed to a chair (including a wheelchair)?: A Little Help needed standing up from a chair using your arms (e.g., wheelchair or bedside chair)?: None Help needed to walk in hospital room?: A Little Help needed climbing 3-5 steps with a railing? : A Little 6 Click Score: 21    End of Session Equipment Utilized During Treatment: Gait belt Activity Tolerance: Patient tolerated treatment well Patient left: in chair;with call bell/phone within reach Nurse Communication: Mobility status PT Visit Diagnosis: Difficulty in walking, not elsewhere classified (R26.2)    Time: 0946-1000 PT Time Calculation (min) (ACUTE ONLY): 14 min   Charges:   PT Evaluation $PT Eval Low Complexity: 1 Low          Lorrin Goodell, PT  Office # [phone] Pager [phone]   Lorriane Shire 02/20/2018, 10:35 AM

## 2018-02-21 LAB — CBC
HCT: 45.8 % (ref 39.0–52.0)
Hemoglobin: 15 g/dL (ref 13.0–17.0)
MCH: 31.9 pg (ref 26.0–34.0)
MCHC: 32.8 g/dL (ref 30.0–36.0)
MCV: 97.4 fL (ref 80.0–100.0)
NRBC: 0.2 % (ref 0.0–0.2)
Platelets: 301 10*3/uL (ref 150–400)
RBC: 4.7 MIL/uL (ref 4.22–5.81)
RDW: 11.9 % (ref 11.5–15.5)
WBC: 12.2 10*3/uL — AB (ref 4.0–10.5)

## 2018-02-21 MED ORDER — ALBUTEROL SULFATE HFA 108 (90 BASE) MCG/ACT IN AERS: 1.0000 | INHALATION_SPRAY | Freq: Four times a day (QID) | RESPIRATORY_TRACT | 0 refills | Status: DC | PRN

## 2018-02-21 MED ORDER — FOLIC ACID 1 MG PO TABS: 1.0000 mg | ORAL_TABLET | Freq: Every day | ORAL | 0 refills | Status: DC

## 2018-02-21 MED ORDER — TIOTROPIUM BROMIDE MONOHYDRATE 1.25 MCG/ACT IN AERS: 2.0000 | INHALATION_SPRAY | Freq: Every day | RESPIRATORY_TRACT | 0 refills | Status: DC

## 2018-02-21 MED ORDER — PREDNISONE 10 MG PO TABS: ORAL_TABLET | ORAL | 0 refills | Status: DC

## 2018-02-21 MED ORDER — AZITHROMYCIN 250 MG PO TABS: 250.0000 mg | ORAL_TABLET | Freq: Every day | ORAL | 0 refills | Status: AC

## 2018-02-21 MED ORDER — MOMETASONE FURO-FORMOTEROL FUM 200-5 MCG/ACT IN AERO: 2.0000 | INHALATION_SPRAY | Freq: Two times a day (BID) | RESPIRATORY_TRACT | 0 refills | Status: DC

## 2018-02-21 MED ORDER — THIAMINE HCL 100 MG PO TABS: 100.0000 mg | ORAL_TABLET | Freq: Every day | ORAL | 0 refills | Status: DC

## 2018-02-21 MED FILL — THIAMINE HCL 100 MG TABS: 100 | 30 days supply | Qty: 30 | Fill #0

## 2018-02-21 MED FILL — FOLIC ACID 1 MG TABS: 1 | 30 days supply | Qty: 30 | Fill #0

## 2018-02-21 MED FILL — PROAIR HFA 90 MCG INHALER: 108 (90 BAS | 11 days supply | Qty: 9 | Fill #0

## 2018-02-21 MED FILL — DULERA 200 MCG/5 MCG INH: 200-5 | 30 days supply | Qty: 13 | Fill #0

## 2018-02-21 MED FILL — SPIRIVA 18 MCG CP-HANDIHALE: 18 | 30 days supply | Qty: 30 | Fill #0

## 2018-02-21 MED FILL — predniSONE 10 MG TABS: 10 | 19 days supply | Qty: 32 | Fill #0

## 2018-02-21 MED FILL — AZITHROMYCIN 250 MG TABLET: 250 | 1 days supply | Qty: 1 | Fill #0

## 2018-02-21 NOTE — Progress Notes (Signed)
Physical Therapy Treatment Patient Details Name: James [name] MRN: [id number] DOB: [birth year] Today's Date: 02/21/2018    History of Present Illness 58 y.o. male with medical history significant of COPD presenting to the hospital via EMS for evaluation of shortness of breath.     PT Comments    Pt ambulated 400' and ascended/ descended 5 steps. SpO2 remained in 90's HR up to mid teens with stairs. Increased WOB noted with ambulation and on stairs.  Pt unsteady with initial standing and had several instances of staggering R and L with self correction.  He prefers to wear steel toed work boots but shuffles feet with them on and fatigue noticeable. Instructed to wear regular tennis shoes when not at work for energy conservation. PT will continue to follow.   Follow Up Recommendations  No PT follow up;Supervision - Intermittent     Equipment Recommendations  None recommended by PT    Recommendations for Other Services       Precautions / Restrictions Precautions Precautions: Fall;Other (comment) Precaution Comments: watch sats Restrictions Weight Bearing Restrictions: No    Mobility  Bed Mobility Overal bed mobility: Modified Independent                Transfers Overall transfer level: Needs assistance Equipment used: None Transfers: Sit to/from Stand Sit to Stand: Supervision         General transfer comment: pt unsteady with initial standing, reports that he often feels that way when he first gets up, especially first thing in the morning. Needed to grab counter and wall with initiation of ambulation  Ambulation/Gait Ambulation/Gait assistance: Min guard Gait Distance (Feet): 400 Feet Assistive device: None Gait Pattern/deviations: Step-through pattern;Shuffle;Staggering left;Staggering right Gait velocity: decreased Gait velocity interpretation: >2.62 ft/sec, indicative of community ambulatory General Gait Details: SpO2 mid 90's throughout, HR low 100's. Pt  donned work boots for ambulation as this is what he usually wears at home. Shuffling feet with boots on though and discussed the need for wearing his tennis shoes when not at work. Occasional staggering gait   Stairs Stairs: Yes Stairs assistance: Supervision Stair Management: One rail Right;Alternating pattern;Forwards Number of Stairs: 5 General stair comments: HR 120's with stairs, O2 sats remained in 90's. Pt needed standing rest break after and noted increased WOB. vc's for pursed lip breathing.    Wheelchair Mobility    Modified Rankin (Stroke Patients Only)       Balance Overall balance assessment: Needs assistance Sitting-balance support: No upper extremity supported;Feet supported Sitting balance-Leahy Scale: Normal     Standing balance support: No upper extremity supported;During functional activity Standing balance-Leahy Scale: Fair Standing balance comment: pt with several LOB with dynamic activity, self correction each time.                             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      Pertinent Vitals/Pain Pain Assessment: No/denies pain    Home Living                      Prior Function            PT Goals (current goals can now be found in the care plan section) Acute Rehab PT Goals Patient Stated Goal: home PT Goal Formulation: With patient Time For Goal Achievement: 03/06/18 Potential to Achieve Goals: Good Progress towards PT  goals: Progressing toward goals    Frequency    Min 3X/week      PT Plan Current plan remains appropriate    Co-evaluation              AM-PAC PT "6 Clicks" Mobility   Outcome Measure  Help needed turning from your back to your side while in a flat bed without using bedrails?: None Help needed moving from lying on your back to sitting on the side of a flat bed without using bedrails?: None Help needed moving to and from a bed to a chair (including a wheelchair)?: None Help needed standing up from a chair using your arms (e.g., wheelchair or bedside chair)?: None Help needed to walk in hospital room?: A Little Help needed climbing 3-5 steps with a railing? : A Little 6 Click Score: 22    End of Session   Activity Tolerance: Patient tolerated treatment well Patient left: in chair;with call bell/phone within reach Nurse Communication: Mobility status PT Visit Diagnosis: Difficulty in walking, not elsewhere classified (R26.2)     Time: 3435-6861 PT Time Calculation (min) (ACUTE ONLY): 12 min  Charges:  $Gait Training: 8-22 mins                     Leighton Roach, [place]  Pager [phone] Office [place] 02/21/2018, 11:04 AM

## 2018-02-21 NOTE — Care Management Note (Addendum)
Case Management Note  Patient Details  Name: BAYANI RENTERIA MRN: [id number] Date of Birth: 1960/07/18  Subjective/Objective:     Patient for dc today, he has follow up apt, he states he does not have any money.  NCM will check with [facility] pharmacy for price of meds with Match, maybe can get help thru csw fund.  He said his sister will be transporting him home today but not sure if she can help him pay for meds.    NCM entered patient in Match portal for [facility] pharmacy.    Patients meds has been paid for , just waiting to be filled.         Action/Plan: DC home today.   Expected Discharge Date:  02/21/18               Expected Discharge Plan:  Home/Self Care  In-House Referral:  NA  Discharge planning Services  CM Consult, Medication Assistance, Follow-up appt scheduled, [facility]  Post Acute Care Choice:  NA Choice offered to:  NA  DME Arranged:  N/A DME Agency:  NA  HH Arranged:  NA HH Agency:  NA  Status of Service:  Completed, signed off  If discussed at [place] of Stay Meetings, dates discussed:    Additional Comments:  Zenon Mayo, RN 02/21/2018, 9:23 AM

## 2018-02-21 NOTE — Discharge Summary (Signed)
Physician Discharge Summary  James [name] JJK:[id number] DOB: 07-24-1960 DOA: 02/16/2018  PCP: Patient, No Pcp Per  Admit date: 02/16/2018 Discharge date: 02/21/2018  Admitted From: Home Disposition:  Home  Recommendations for Outpatient Follow-up:  1. Follow up with PCP in 1 week, assisted in establishing appointment as below  2. Please obtain CBC once completed with steroid taper   Discharge Condition: Stable CODE STATUS: Full  Diet recommendation: Heart healthy   Brief/Interim Summary: James [name] is a 58 y.o.malewith medical history significant ofCOPD presenting to the hospital via EMS for evaluation of shortness of breath.Patient states he was feeling weak and havingarunny nose last week which he thought was due to the flu. States he did not seek medical attention and was not prescribed any medications. States for the past 2 days he has been very short of breath, wheezing, and coughing. He ran out of his COPD inhalers a month ago. He quit smoking 5 years ago.   Discharge Diagnoses:  Acute COPD exacerbation -Chest x-ray independently reviewed, lungs are hyperinflated without acute pulmonary process -Treated with IV solumedrol, azithromycin, nebs --> transition to prednisone taper. Inhalers prescribed.   Acute hypoxemic respiratory failure -Weaned down to room air   Leukocytosis -Secondary to steroid use, continue to monitor  Alcohol abuse -Patient reports drinking a sixpack of beer every night, last drink 2/12 at 5 PM -Cessation counseling    Discharge Instructions  Discharge Instructions    Call MD for:  difficulty breathing, headache or visual disturbances   Complete by:  As directed    Call MD for:  extreme fatigue   Complete by:  As directed    Call MD for:  hives   Complete by:  As directed    Call MD for:  persistant dizziness or light-headedness   Complete by:  As directed    Call MD for:  persistant nausea and vomiting   Complete by:  As  directed    Call MD for:  severe uncontrolled pain   Complete by:  As directed    Call MD for:  temperature >100.4   Complete by:  As directed    Diet - low sodium heart healthy   Complete by:  As directed    Discharge instructions   Complete by:  As directed    You were cared for by a hospitalist during your hospital stay. If you have any questions about your discharge medications or the care you received while you were in the hospital after you are discharged, you can call the unit and ask to speak with the hospitalist on call if the hospitalist that took care of you is not available. Once you are discharged, your primary care physician will handle any further medical issues. Please note that NO REFILLS for any discharge medications will be authorized once you are discharged, as it is imperative that you return to your primary care physician (or establish a relationship with a primary care physician if you do not have one) for your aftercare needs so that they can reassess your need for medications and monitor your lab values.   Increase activity slowly   Complete by:  As directed      Allergies as of 02/21/2018   No Known Allergies     Medication List    TAKE these medications   albuterol 108 (90 Base) MCG/ACT inhaler Commonly known as:  PROVENTIL HFA;VENTOLIN HFA Inhale 1-2 puffs into the lungs every 6 (six) hours as needed for wheezing  or shortness of breath.   azithromycin 250 MG tablet Commonly known as:  ZITHROMAX Take 1 tablet (250 mg total) by mouth daily for 1 day.   folic acid 1 MG tablet Commonly known as:  FOLVITE Take 1 tablet (1 mg total) by mouth daily.   mometasone-formoterol 200-5 MCG/ACT Aero Commonly known as:  DULERA Inhale 2 puffs into the lungs 2 (two) times daily.   predniSONE 10 MG tablet Commonly known as:  DELTASONE Take 4 tabs for 3 days, then 3 tabs for 3 days, then 2 tabs for 3 days, then 1 tab for 3 days, then 1/2 tab for 4 days. What changed:   additional instructions   thiamine 100 MG tablet Take 1 tablet (100 mg total) by mouth daily.   Tiotropium Bromide Monohydrate 1.25 MCG/ACT Aers Commonly known as:  SPIRIVA RESPIMAT Inhale 2 puffs into the lungs daily.      Follow-up Information    Primary Care at [facility]. Go on 03/14/2018.   Specialty:  Family Medicine Why:  at 8:50 am for your hospital follow-up appointment to establish a primary care physician Contact information: [address] [place]. Go to.   Why:  for your prescription needs; discounted medications $4-$10 Contact information: [place] [address] [phone]         No Known Allergies  Consultations:  None    Procedures/Studies: Dg Chest 2 View  Result Date: 02/16/2018 CLINICAL DATA:  Shortness of breath EXAM: CHEST - 2 VIEW COMPARISON:  07/11/2017 FINDINGS: Cardiac shadows within normal limits. Aortic calcifications are again seen. The lungs are well aerated bilaterally. No bony abnormality is seen. Old rib fractures are noted. IMPRESSION: No acute abnormality is seen Electronically Signed   By: Inez Catalina M.D.   On: 02/16/2018 01:14      Discharge Exam: Vitals:   02/21/18 0353 02/21/18 0726  BP: (!) 142/99   Pulse: 72   Resp:    Temp:    SpO2:  95%    General: Pt is alert, awake, not in acute distress Cardiovascular: RRR, S1/S2 +, no rubs, no gallops Respiratory: Minimal wheezes bilaterally, no respiratory distress on room air  Abdominal: Soft, NT, ND, bowel sounds + Extremities: no edema, no cyanosis    The results of significant diagnostics from this hospitalization (including imaging, microbiology, ancillary and laboratory) are listed below for reference.     Microbiology: No results found for this or any previous visit (from the past 240 hour(s)).   Labs: BNP (last 3  results) No results for input(s): BNP in the last 8760 hours. Basic Metabolic Panel: Recent Labs  Lab 02/16/18 0036 02/18/18 0228  NA 141 141  K 3.7 4.3  CL 101 101  CO2 28 28  GLUCOSE 121* 166*  BUN 9 17  CREATININE 0.72 0.81  CALCIUM 8.8* 9.2   Liver Function Tests: No results for input(s): AST, ALT, ALKPHOS, BILITOT, PROT, ALBUMIN in the last 168 hours. No results for input(s): LIPASE, AMYLASE in the last 168 hours. No results for input(s): AMMONIA in the last 168 hours. CBC: Recent Labs  Lab 02/16/18 0036 02/18/18 0228 02/19/18 0231 02/20/18 0213 02/21/18 0220  WBC 7.4 14.2* 14.7* 14.9* 12.2*  NEUTROABS 3.2  --   --   --   --   HGB 14.6 14.0 14.4 14.4 15.0  HCT 43.9 42.4 43.8 43.9 45.8  MCV 98.0 99.3 99.5 98.9 97.4  PLT 216 282 289 290 301   Cardiac Enzymes: No results for input(s): CKTOTAL, CKMB, CKMBINDEX, TROPONINI in the last 168 hours. BNP: Invalid input(s): POCBNP CBG: No results for input(s): GLUCAP in the last 168 hours. D-Dimer No results for input(s): DDIMER in the last 72 hours. Hgb A1c No results for input(s): HGBA1C in the last 72 hours. Lipid Profile No results for input(s): CHOL, HDL, LDLCALC, TRIG, CHOLHDL, LDLDIRECT in the last 72 hours. Thyroid function studies No results for input(s): TSH, T4TOTAL, T3FREE, THYROIDAB in the last 72 hours.  Invalid input(s): FREET3 Anemia work up No results for input(s): VITAMINB12, FOLATE, FERRITIN, TIBC, IRON, RETICCTPCT in the last 72 hours. Urinalysis    Component Value Date/Time   COLORURINE YELLOW 04/06/2016 1307   APPEARANCEUR CLEAR 04/06/2016 1307   LABSPEC 1.012 04/06/2016 1307   PHURINE 5.0 04/06/2016 1307   GLUCOSEU >=500 (A) 04/06/2016 1307   HGBUR NEGATIVE 04/06/2016 [place] 04/06/2016 1307   KETONESUR NEGATIVE 04/06/2016 1307   PROTEINUR NEGATIVE 04/06/2016 1307   UROBILINOGEN 4.0 (H) 06/15/2013 1030   NITRITE NEGATIVE 04/06/2016 1307   LEUKOCYTESUR NEGATIVE  04/06/2016 1307   Sepsis Labs Invalid input(s): PROCALCITONIN,  WBC,  LACTICIDVEN Microbiology No results found for this or any previous visit (from the past 240 hour(s)).   Patient was seen and examined on the day of discharge and was found to be in stable condition. Time coordinating discharge: 25 minutes including assessment and coordination of care, as well as examination of the patient.   SIGNED:  Dessa Phi, DO [facility] [email or web address] 02/21/2018, 7:55 AM

## 2018-03-14 ENCOUNTER — Encounter: Payer: Self-pay | Admitting: Family Medicine

## 2018-03-14 ENCOUNTER — Ambulatory Visit (INDEPENDENT_AMBULATORY_CARE_PROVIDER_SITE_OTHER): Payer: Self-pay | Admitting: Family Medicine

## 2018-03-14 ENCOUNTER — Telehealth: Payer: Self-pay

## 2018-03-14 VITALS — BP 149/73 | HR 102 | Temp 97.5°F | Resp 18 | Ht 67.0 in | Wt 189.2 lb

## 2018-03-14 DIAGNOSIS — D72829 Elevated white blood cell count, unspecified: Secondary | ICD-10-CM

## 2018-03-14 DIAGNOSIS — J441 Chronic obstructive pulmonary disease with (acute) exacerbation: Secondary | ICD-10-CM

## 2018-03-14 DIAGNOSIS — Z7689 Persons encountering health services in other specified circumstances: Secondary | ICD-10-CM

## 2018-03-14 DIAGNOSIS — I10 Essential (primary) hypertension: Secondary | ICD-10-CM

## 2018-03-14 DIAGNOSIS — R7309 Other abnormal glucose: Secondary | ICD-10-CM

## 2018-03-14 DIAGNOSIS — Z125 Encounter for screening for malignant neoplasm of prostate: Secondary | ICD-10-CM

## 2018-03-14 MED ORDER — LOSARTAN POTASSIUM 25 MG PO TABS: 25.0000 mg | ORAL_TABLET | Freq: Every day | ORAL | 2 refills | Status: DC

## 2018-03-14 MED ORDER — MOMETASONE FURO-FORMOTEROL FUM 200-5 MCG/ACT IN AERO: 2.0000 | INHALATION_SPRAY | Freq: Two times a day (BID) | RESPIRATORY_TRACT | 0 refills | Status: DC

## 2018-03-14 MED ORDER — BENZONATATE 100 MG PO CAPS: 100.0000 mg | ORAL_CAPSULE | Freq: Three times a day (TID) | ORAL | 0 refills | Status: DC | PRN

## 2018-03-14 MED ORDER — ALBUTEROL SULFATE (2.5 MG/3ML) 0.083% IN NEBU: 2.5000 mg | INHALATION_SOLUTION | Freq: Four times a day (QID) | RESPIRATORY_TRACT | 1 refills | Status: DC | PRN

## 2018-03-14 MED ORDER — TIOTROPIUM BROMIDE MONOHYDRATE 1.25 MCG/ACT IN AERS: 2.0000 | INHALATION_SPRAY | Freq: Every day | RESPIRATORY_TRACT | 6 refills | Status: DC

## 2018-03-14 MED ORDER — UMECLIDINIUM BROMIDE 62.5 MCG/INH IN AEPB: 1.0000 | INHALATION_SPRAY | Freq: Every day | RESPIRATORY_TRACT | 5 refills | Status: DC

## 2018-03-14 MED ORDER — ALBUTEROL SULFATE HFA 108 (90 BASE) MCG/ACT IN AERS: 1.0000 | INHALATION_SPRAY | Freq: Four times a day (QID) | RESPIRATORY_TRACT | 0 refills | Status: DC | PRN

## 2018-03-14 MED ORDER — GUAIFENESIN ER 1200 MG PO TB12: 1200.0000 mg | ORAL_TABLET | Freq: Two times a day (BID) | ORAL | 0 refills | Status: AC

## 2018-03-14 MED ORDER — IPRATROPIUM-ALBUTEROL 0.5-2.5 (3) MG/3ML IN SOLN
3.0000 mL | Freq: Once | RESPIRATORY_TRACT | Status: AC
  Administered 2018-03-14: 3 mL via RESPIRATORY_TRACT

## 2018-03-14 MED FILL — BENZONATATE 100 MG CAP: 100 | 10 days supply | Qty: 60 | Fill #0

## 2018-03-14 MED FILL — LOSARTAN POTASSIUM 25 MG TA: 25 | 30 days supply | Qty: 30 | Fill #0

## 2018-03-14 MED FILL — ALBUTEROL SUL 2.5 MG/3 ML S: (2.5 MG/3ML | 7 days supply | Qty: 90 | Fill #0

## 2018-03-14 NOTE — Progress Notes (Deleted)
New Patient Office Visit  Subjective:  Patient ID: James [name], male    DOB: 1960/01/22  Age: 58 y.o. MRN: [id number]  CC:  Chief Complaint  Patient presents with  . Establish Care  . Hospitalization Follow-up    ED->Hosp 2/13-2/18: COPD exacerbation    HPI James [name] presents for   Past Medical History:  Diagnosis Date  . COPD (chronic obstructive pulmonary disease) ([place])   . Pneumothorax    2016, fell from ladder    Past Surgical History:  Procedure Laterality Date  . VIDEO ASSISTED THORACOSCOPY (VATS)/THOROCOTOMY Left 06/11/2013   Procedure: VIDEO ASSISTED THORACOSCOPY (VATS)/THOROCOTOMY;  Surgeon: Ivin Poot, MD;  Location: [facility];  Service: Thoracic;  Laterality: Left;  [name] VIDEO BRONCHOSCOPY N/A 06/11/2013   Procedure: VIDEO BRONCHOSCOPY;  Surgeon: Ivin Poot, MD;  Location: [facility] OR;  Service: Thoracic;  Laterality: N/A;    Family History  Problem Relation Age of Onset  . Heart disease Father   . COPD Sister     Social History   Socioeconomic History  . Marital status: Divorced    Spouse name: Not on file  . Number of children: Not on file  . Years of education: Not on file  . Highest education level: Not on file  Occupational History  . Occupation: Dealer  . Occupation: [employer]  . Financial resource strain: Not on file  . Food insecurity:    Worry: Not on file    Inability: Not on file  . Transportation needs:    Medical: Not on file    Non-medical: Not on file  Tobacco Use  . Smoking status: Former Smoker    Packs/day: 1.50    Years: 40.00    Pack years: 60.00    Types: Cigarettes  . Smokeless tobacco: Current User    Types: Chew  Substance and Sexual Activity  . Alcohol use: Yes    Alcohol/week: 21.0 standard drinks    Types: 21 Cans of beer per week  . Drug use: Yes    Types: Marijuana    Comment: daily   . Sexual activity: Not Currently    Partners: Female  Lifestyle  . Physical activity:    Days per week:  Not on file    Minutes per session: Not on file  . Stress: Not on file  Relationships  . Social connections:    Talks on phone: Not on file    Gets together: Not on file    Attends religious service: Not on file    Active member of club or organization: Not on file    Attends meetings of clubs or organizations: Not on file    Relationship status: Not on file  . Intimate partner violence:    Fear of current or ex partner: Not on file    Emotionally abused: Not on file    Physically abused: Not on file    Forced sexual activity: Not on file  Other Topics Concern  . Not on file  Social History Narrative   Lives alone near [place] Review of Systems  Objective:   Today's Vitals: BP (!) 149/73   Pulse (!) 102   Temp (!) 97.5 F (36.4 C) (Oral)   Resp 18   Ht 5\' 7"  (1.702 m)   Wt 189 lb 3.2 oz (85.8 kg)   SpO2 94%   BMI 29.63 kg/m   Physical Exam  Assessment & Plan:   Problem  List Items Addressed This Visit    None    Visit Diagnoses    Encounter to establish care    -  Primary      Outpatient Encounter Medications as of 03/14/2018  Medication Sig  . albuterol (PROVENTIL HFA;VENTOLIN HFA) 108 (90 Base) MCG/ACT inhaler Inhale 1-2 puffs into the lungs every 6 (six) hours as needed for wheezing or shortness of breath.  . mometasone-formoterol (DULERA) 200-5 MCG/ACT AERO Inhale 2 puffs into the lungs 2 (two) times daily.  [name] SPIRIVA HANDIHALER 18 MCG inhalation capsule Place 1 puff into inhaler and inhale daily.  . [DISCONTINUED] folic acid (FOLVITE) 1 MG tablet Take 1 tablet (1 mg total) by mouth daily.  . [DISCONTINUED] predniSONE (DELTASONE) 10 MG tablet Take 4 tabs for 3 days, then 3 tabs for 3 days, then 2 tabs for 3 days, then 1 tab for 3 days, then 1/2 tab for 4 days.  . [DISCONTINUED] thiamine 100 MG tablet Take 1 tablet (100 mg total) by mouth daily.  . [DISCONTINUED] Tiotropium Bromide Monohydrate (SPIRIVA RESPIMAT) 1.25 MCG/ACT AERS Inhale 2 puffs  into the lungs daily.   No facility-administered encounter medications on file as of 03/14/2018.     Follow-up: No follow-ups on file.   Claria Dice, RN

## 2018-03-14 NOTE — Telephone Encounter (Signed)
Pharmacy called & states that the Spiriva inhaler is too expensive to order. If possible they would like prescription changed to [place].  Please advise.

## 2018-03-14 NOTE — Patient Instructions (Addendum)
Thank you for choosing Primary Care at [facility] to be your medical home!    James [name] was seen by Molli Barrows, FNP today.   James [name]'s primary care provider is Scot Jun, FNP.   For the best care possible, you should try to see Molli Barrows, FNP-C whenever you come to the clinic.   We look forward to seeing you again soon!  If you have any questions about your visit today, please call us at [phone] or feel free to reach your primary care provider via [place].    For cough I have prescribed you benzonatate and to assist with mucus thinning I have prescribed you guaifenesin 1200 mg twice daily which you should take with a full glass of water.  All other medications have been refilled per your request.  In order to obtain future refills please contact the pharmacy directly.  Upon picking up your prescriptions please request speak with [name] to obtain your nebulizer machine.  You have been diagnosed today with hypertension.  You are being started on losartan 25 mg once daily only.  You will return in 2 weeks for blood pressure follow-up to evaluate effectiveness of medication.  Take medication prior to arrival.     Chronic Obstructive Pulmonary Disease Chronic obstructive pulmonary disease (COPD) is a long-term (chronic) lung problem. When you have COPD, it is hard for air to get in and out of your lungs. Usually the condition gets worse over time, and your lungs will never return to normal. There are things you can do to keep yourself as healthy as possible.  Your doctor may treat your condition with: ? Medicines. ? Oxygen. ? Lung surgery.  Your doctor may also recommend: ? Rehabilitation. This includes steps to make your body work better. It may involve a team of specialists. ? Quitting smoking, if you smoke. ? Exercise and changes to your diet. ? Comfort measures (palliative care). Follow these instructions at home: Medicines  Take  over-the-counter and prescription medicines only as told by your doctor.  Talk to your doctor before taking any cough or allergy medicines. You may need to avoid medicines that cause your lungs to be dry. Lifestyle  If you smoke, stop. Smoking makes the problem worse. If you need help quitting, ask your doctor.  Avoid being around things that make your breathing worse. This may include smoke, chemicals, and fumes.  Stay active, but remember to rest as well.  Learn and use tips on how to relax.  Make sure you get enough sleep. Most adults need at least 7 hours of sleep every night.  Eat healthy foods. Eat smaller meals more often. Rest before meals. Controlled breathing Learn and use tips on how to control your breathing as told by your doctor. Try:  Breathing in (inhaling) through your nose for 1 second. Then, pucker your lips and breath out (exhale) through your lips for 2 seconds.  Putting one hand on your belly (abdomen). Breathe in slowly through your nose for 1 second. Your hand on your belly should move out. Pucker your lips and breathe out slowly through your lips. Your hand on your belly should move in as you breathe out.  Controlled coughing Learn and use controlled coughing to clear mucus from your lungs. Follow these steps: 1. Lean your head a little forward. 2. Breathe in deeply. 3. Try to hold your breath for 3 seconds. 4. Keep your mouth slightly open while coughing 2 times. 5. Spit  any mucus out into a tissue. 6. Rest and do the steps again 1 or 2 times as needed. General instructions  Make sure you get all the shots (vaccines) that your doctor recommends. Ask your doctor about a flu shot and a pneumonia shot.  Use oxygen therapy and pulmonary rehabilitation if told by your doctor. If you need home oxygen therapy, ask your doctor if you should buy a tool to measure your oxygen level (oximeter).  Make a COPD action plan with your doctor. This helps you to know what  to do if you feel worse than usual.  Manage any other conditions you have as told by your doctor.  Avoid going outside when it is very hot, cold, or humid.  Avoid people who have a sickness you can catch (contagious).  Keep all follow-up visits as told by your doctor. This is important. Contact a doctor if:  You cough up more mucus than usual.  There is a change in the color or thickness of the mucus.  It is harder to breathe than usual.  Your breathing is faster than usual.  You have trouble sleeping.  You need to use your medicines more often than usual.  You have trouble doing your normal activities such as getting dressed or walking around the house. Get help right away if:  You have shortness of breath while resting.  You have shortness of breath that stops you from: ? Being able to talk. ? Doing normal activities.  Your chest hurts for longer than 5 minutes.  Your skin color is more blue than usual.  Your pulse oximeter shows that you have low oxygen for longer than 5 minutes.  You have a fever.  You feel too tired to breathe normally. Summary  Chronic obstructive pulmonary disease (COPD) is a long-term lung problem.  The way your lungs work will never return to normal. Usually the condition gets worse over time. There are things you can do to keep yourself as healthy as possible.  Take over-the-counter and prescription medicines only as told by your doctor.  If you smoke, stop. Smoking makes the problem worse. This information is not intended to replace advice given to you by your health care provider. Make sure you discuss any questions you have with your health care provider. Document Released: 06/09/2007 Document Revised: 01/26/2016 Document Reviewed: 01/26/2016 Elsevier Interactive Patient Education  2019 [employer].     Hypertension Hypertension, commonly called high blood pressure, is when the force of blood pumping through the arteries is too  strong. The arteries are the blood vessels that carry blood from the heart throughout the body. Hypertension forces the heart to work harder to pump blood and may cause arteries to become narrow or stiff. Having untreated or uncontrolled hypertension can cause heart attacks, strokes, kidney disease, and other problems. A blood pressure reading consists of a higher number over a lower number. Ideally, your blood pressure should be below 120/80. The first ("top") number is called the systolic pressure. It is a measure of the pressure in your arteries as your heart beats. The second ("bottom") number is called the diastolic pressure. It is a measure of the pressure in your arteries as the heart relaxes. What are the causes? The cause of this condition is not known. What increases the risk? Some risk factors for high blood pressure are under your control. Others are not. Factors you can change  Smoking.  Having type 2 diabetes mellitus, high cholesterol, or both.  Not getting enough exercise or physical activity.  Being overweight.  Having too much fat, sugar, calories, or salt (sodium) in your diet.  Drinking too much alcohol. Factors that are difficult or impossible to change  Having chronic kidney disease.  Having a family history of high blood pressure.  Age. Risk increases with age.  Race. You may be at higher risk if you are African-American.  Gender. Men are at higher risk than women before age 56. After age 41, women are at higher risk than men.  Having obstructive sleep apnea.  Stress. What are the signs or symptoms? Extremely high blood pressure (hypertensive crisis) may cause:  Headache.  Anxiety.  Shortness of breath.  Nosebleed.  Nausea and vomiting.  Severe chest pain.  Jerky movements you cannot control (seizures). How is this diagnosed? This condition is diagnosed by measuring your blood pressure while you are seated, with your arm resting on a surface.  The cuff of the blood pressure monitor will be placed directly against the skin of your upper arm at the level of your heart. It should be measured at least twice using the same arm. Certain conditions can cause a difference in blood pressure between your right and left arms. Certain factors can cause blood pressure readings to be lower or higher than normal (elevated) for a short period of time:  When your blood pressure is higher when you are in a health care provider's office than when you are at home, this is called white coat hypertension. Most people with this condition do not need medicines.  When your blood pressure is higher at home than when you are in a health care provider's office, this is called masked hypertension. Most people with this condition may need medicines to control blood pressure. If you have a high blood pressure reading during one visit or you have normal blood pressure with other risk factors:  You may be asked to return on a different day to have your blood pressure checked again.  You may be asked to monitor your blood pressure at home for 1 week or longer. If you are diagnosed with hypertension, you may have other blood or imaging tests to help your health care provider understand your overall risk for other conditions. How is this treated? This condition is treated by making healthy lifestyle changes, such as eating healthy foods, exercising more, and reducing your alcohol intake. Your health care provider may prescribe medicine if lifestyle changes are not enough to get your blood pressure under control, and if:  Your systolic blood pressure is above 130.  Your diastolic blood pressure is above 80. Your personal target blood pressure may vary depending on your medical conditions, your age, and other factors. Follow these instructions at home: Eating and drinking   Eat a diet that is high in fiber and potassium, and low in sodium, added sugar, and fat. An  example eating plan is called the DASH (Dietary Approaches to Stop Hypertension) diet. To eat this way: ? Eat plenty of fresh fruits and vegetables. Try to fill half of your plate at each meal with fruits and vegetables. ? Eat whole grains, such as whole wheat pasta, brown rice, or whole grain bread. Fill about one quarter of your plate with whole grains. ? Eat or drink low-fat dairy products, such as skim milk or low-fat yogurt. ? Avoid fatty cuts of meat, processed or cured meats, and poultry with skin. Fill about one quarter of your plate with lean proteins,  such as fish, chicken without skin, beans, eggs, and tofu. ? Avoid premade and processed foods. These tend to be higher in sodium, added sugar, and fat.  Reduce your daily sodium intake. Most people with hypertension should eat less than 1,500 mg of sodium a day.  Limit alcohol intake to no more than 1 drink a day for nonpregnant women and 2 drinks a day for men. One drink equals 12 oz of beer, 5 oz of wine, or 1 oz of hard liquor. Lifestyle   Work with your health care provider to maintain a healthy body weight or to lose weight. Ask what an ideal weight is for you.  Get at least 30 minutes of exercise that causes your heart to beat faster (aerobic exercise) most days of the week. Activities may include walking, swimming, or biking.  Include exercise to strengthen your muscles (resistance exercise), such as pilates or lifting weights, as part of your weekly exercise routine. Try to do these types of exercises for 30 minutes at least 3 days a week.  Do not use any products that contain nicotine or tobacco, such as cigarettes and e-cigarettes. If you need help quitting, ask your health care provider.  Monitor your blood pressure at home as told by your health care provider.  Keep all follow-up visits as told by your health care provider. This is important. Medicines  Take over-the-counter and prescription medicines only as told by  your health care provider. Follow directions carefully. Blood pressure medicines must be taken as prescribed.  Do not skip doses of blood pressure medicine. Doing this puts you at risk for problems and can make the medicine less effective.  Ask your health care provider about side effects or reactions to medicines that you should watch for. Contact a health care provider if:  You think you are having a reaction to a medicine you are taking.  You have headaches that keep coming back (recurring).  You feel dizzy.  You have swelling in your ankles.  You have trouble with your vision. Get help right away if:  You develop a severe headache or confusion.  You have unusual weakness or numbness.  You feel faint.  You have severe pain in your chest or abdomen.  You vomit repeatedly.  You have trouble breathing. Summary  Hypertension is when the force of blood pumping through your arteries is too strong. If this condition is not controlled, it may put you at risk for serious complications.  Your personal target blood pressure may vary depending on your medical conditions, your age, and other factors. For most people, a normal blood pressure is less than 120/80.  Hypertension is treated with lifestyle changes, medicines, or a combination of both. Lifestyle changes include weight loss, eating a healthy, low-sodium diet, exercising more, and limiting alcohol. This information is not intended to replace advice given to you by your health care provider. Make sure you discuss any questions you have with your health care provider. Document Released: 12/21/2004 Document Revised: 11/19/2015 Document Reviewed: 11/19/2015 Elsevier Interactive Patient Education  2019 [employer].

## 2018-03-14 NOTE — Telephone Encounter (Signed)
This has already been completed. Thanks!  Molli Barrows, FNP

## 2018-03-14 NOTE — Progress Notes (Signed)
James [name], is a 58 y.o. male  WCH:[id number]  [id number]:[id number]  DOB - Mar 14, 1960  CC:  Chief Complaint  Patient presents with  . Establish Care  . Hospitalization Follow-up    ED->Hosp 2/13-2/18: COPD exacerbation       HPI: James [name] is a 58 y.o. male is here today to establish care and hospital follow-up for COPD exacerbation.  James [name] has Acute on chronic respiratory failure with hypoxia [facility]); COPD (chronic obstructive pulmonary disease) ([place]); History of tobacco abuse; Alcohol abuse; Elevated liver enzymes; Low back pain radiating to both legs; Hypertension; and COPD with acute exacerbation ([place]) on their problem list.   Active medical problems as noted above. Patient is an active alcohol drinker with a habit  6 pack of beer per day. Patient was admitted to [facility]  02/16/18-02/21/18 for COPD exacerbation after running out of home inhalers. He has been lost to follow-up with a PCP since 2018.  He presents today with an audible wheeze and continues to complain of some shortness of breath with exertion. Continues to have a nonproductive cough. He is not currently on supplemental oxygen. He is a former smoker and quit approximately 5 years ago.  He is uninsured and has mostly received medications from visits at the ER or urgent cares.  Denies any headache, weakness, dizziness, chest pain or palpitations.   Current medications: Current Outpatient Medications:  .  albuterol (PROVENTIL HFA;VENTOLIN HFA) 108 (90 Base) MCG/ACT inhaler, Inhale 1-2 puffs into the lungs every 6 (six) hours as needed for wheezing or shortness of breath., Disp: 1 Inhaler, Rfl: 0 .  mometasone-formoterol (DULERA) 200-5 MCG/ACT AERO, Inhale 2 puffs into the lungs 2 (two) times daily., Disp: 1 Inhaler, Rfl: 0 .  SPIRIVA HANDIHALER 18 MCG inhalation capsule, Place 1 puff into inhaler and inhale daily., Disp: , Rfl:    Pertinent family medical history: family history includes COPD in his sister; Heart  disease in his father.   No Known Allergies  Social History   Socioeconomic History  . Marital status: Divorced    Spouse name: Not on file  . Number of children: Not on file  . Years of education: Not on file  . Highest education level: Not on file  Occupational History  . Occupation: Dealer  . Occupation: [employer]  . Financial resource strain: Not on file  . Food insecurity:    Worry: Not on file    Inability: Not on file  . Transportation needs:    Medical: Not on file    Non-medical: Not on file  Tobacco Use  . Smoking status: Former Smoker    Packs/day: 1.50    Years: 40.00    Pack years: 60.00    Types: Cigarettes  . Smokeless tobacco: Current User    Types: Chew  Substance and Sexual Activity  . Alcohol use: Yes    Alcohol/week: 21.0 standard drinks    Types: 21 Cans of beer per week  . Drug use: Yes    Types: Marijuana    Comment: daily   . Sexual activity: Not Currently    Partners: Female  Lifestyle  . Physical activity:    Days per week: Not on file    Minutes per session: Not on file  . Stress: Not on file  Relationships  . Social connections:    Talks on phone: Not on file    Gets together: Not on file    Attends religious service: Not on file  Active member of club or organization: Not on file    Attends meetings of clubs or organizations: Not on file    Relationship status: Not on file  . Intimate partner violence:    Fear of current or ex partner: Not on file    Emotionally abused: Not on file    Physically abused: Not on file    Forced sexual activity: Not on file  Other Topics Concern  . Not on file  Social History Narrative   Lives alone near [place]    Review of Systems: Pertinent negatives listed in HPI Objective:   Vitals:   03/14/18 0859  BP: (!) 149/73  Pulse: (!) 102  Resp: 18  Temp: (!) 97.5 F (36.4 C)  SpO2: 94%    BP Readings from Last 3 Encounters:  03/14/18 (!) 149/73  02/21/18 138/90   07/11/17 132/82    Filed Weights   03/14/18 0859  Weight: 189 lb 3.2 oz (85.8 kg)      Physical Exam: Constitutional: Patient appears in poor health- no distress. HENT: Normocephalic, atraumatic, External right and left ear normal. Oropharynx is clear and moist.  Eyes: Conjunctivae and EOM are normal. PERRLA, no scleral icterus. Neck: Normal ROM. Neck supple. No JVD. No tracheal deviation. No thyromegaly. CVS: RRR, S1/S2 +, no murmurs, no gallops, no carotid bruit.  Pulmonary: Expiratory wheezes noted. Diminished lung sounds.  Abdominal: Soft. BS +, no distension, tenderness, rebound or guarding.  Musculoskeletal: Normal range of motion. No edema and no tenderness.  Neuro: Alert. Normal muscle tone coordination. Normal gait. BUE and BLE strength 5/5. Bilateral hand grips symmetrical. Skin: Skin is warm and dry. No rash noted. Not diaphoretic. No erythema. No pallor. Psychiatric: Normal mood and affect. Behavior, judgment, thought content normal. Lab Results (prior encounters)  Lab Results  Component Value Date   WBC 12.2 (H) 02/21/2018   HGB 15.0 02/21/2018   HCT 45.8 02/21/2018   MCV 97.4 02/21/2018   PLT 301 02/21/2018   Lab Results  Component Value Date   CREATININE 0.81 02/18/2018   BUN 17 02/18/2018   NA 141 02/18/2018   K 4.3 02/18/2018   CL 101 02/18/2018   CO2 28 02/18/2018    Lab Results  Component Value Date   HGBA1C 5.7 (H) 03/26/2016      Component Value Date/Time   TRIG 50 06/13/2013 0845        Assessment and plan:  1. Encounter to establish care  2. COPD with acute exacerbation ([place]) Continue with LABA and SABA treatments prescribed during recent hospital stay  - DME Nebulizer machine, patient does not have a nebulizer machine however was able to coordinate with social work to provide patient with a nebulizer machine today.  He was instructed to begin albuterol treatments every 6 hours as needed at home with albuterol. - Ambulatory referral to  Pulmonology - ipratropium-albuterol (DUONEB) 0.5-2.5 (3) MG/3ML nebulizer solution 3 mL, treatment given in office today lung sounds improved.  3. Elevated glucose - Comprehensive metabolic panel - Hemoglobin A1c  4. Essential hypertension, NEW  Patient newly diagnosed with hypertension today.  In review of EMR blood pressure has been markedly elevated during prior ER visits.  Will start amlodipine 5 mg once daily today we will check a TSH. - TSH  5. Leukocytosis, unspecified type - CBC with Differential  6. Screening PSA (prostate specific antigen) - PSA   Return in about 2 weeks (around 03/28/2018) for COPD and Hypertension .   The patient  was given clear instructions to go to ER or return to medical center if symptoms don't improve, worsen or new problems develop. The patient verbalized understanding. The patient was advised  to call and obtain lab results if they haven't heard anything from out office within 7-10 business days.  Molli Barrows, FNP Primary Care at [facility] [address] [place] [address] [phone]: [phone]    This note has been created with Dragon speech recognition software and Engineer, materials. Any transcriptional errors are unintentional.

## 2018-03-15 LAB — CBC WITH DIFFERENTIAL/PLATELET
BASOS: 1 %
Basophils Absolute: 0 10*3/uL (ref 0.0–0.2)
EOS (ABSOLUTE): 0.2 10*3/uL (ref 0.0–0.4)
Eos: 3 %
Hematocrit: 42.8 % (ref 37.5–51.0)
Hemoglobin: 14.3 g/dL (ref 13.0–17.7)
Immature Grans (Abs): 0 10*3/uL (ref 0.0–0.1)
Immature Granulocytes: 0 %
Lymphocytes Absolute: 2.4 10*3/uL (ref 0.7–3.1)
Lymphs: 33 %
MCH: 32.9 pg (ref 26.6–33.0)
MCHC: 33.4 g/dL (ref 31.5–35.7)
MCV: 99 fL — ABNORMAL HIGH (ref 79–97)
Monocytes Absolute: 0.9 10*3/uL (ref 0.1–0.9)
Monocytes: 13 %
Neutrophils Absolute: 3.6 10*3/uL (ref 1.4–7.0)
Neutrophils: 50 %
Platelets: 239 10*3/uL (ref 150–450)
RBC: 4.34 x10E6/uL (ref 4.14–5.80)
RDW: 12.4 % (ref 11.6–15.4)
WBC: 7.2 10*3/uL (ref 3.4–10.8)

## 2018-03-15 LAB — COMPREHENSIVE METABOLIC PANEL
A/G RATIO: 1.9 (ref 1.2–2.2)
ALT: 23 IU/L (ref 0–44)
AST: 16 IU/L (ref 0–40)
Albumin: 3.9 g/dL (ref 3.8–4.9)
Alkaline Phosphatase: 50 IU/L (ref 39–117)
BUN/Creatinine Ratio: 19 (ref 9–20)
BUN: 16 mg/dL (ref 6–24)
Bilirubin Total: 0.3 mg/dL (ref 0.0–1.2)
CO2: 23 mmol/L (ref 20–29)
Calcium: 9.2 mg/dL (ref 8.7–10.2)
Chloride: 102 mmol/L (ref 96–106)
Creatinine, Ser: 0.85 mg/dL (ref 0.76–1.27)
GFR calc Af Amer: 113 mL/min/{1.73_m2} (ref >59)
GFR calc non Af Amer: 97 mL/min/{1.73_m2} (ref >59)
Globulin, Total: 2.1 g/dL (ref 1.5–4.5)
Glucose: 120 mg/dL — ABNORMAL HIGH (ref 65–99)
Potassium: 4.3 mmol/L (ref 3.5–5.2)
Sodium: 141 mmol/L (ref 134–144)
Total Protein: 6 g/dL (ref 6.0–8.5)

## 2018-03-15 LAB — HEMOGLOBIN A1C
Est. average glucose Bld gHb Est-mCnc: 126 mg/dL
Hgb A1c MFr Bld: 6 % — ABNORMAL HIGH (ref 4.8–5.6)

## 2018-03-15 LAB — PSA: Prostate Specific Ag, Serum: 1.7 ng/mL (ref 0.0–4.0)

## 2018-03-15 LAB — TSH: TSH: 0.925 u[IU]/mL (ref 0.450–4.500)

## 2018-03-29 ENCOUNTER — Telehealth: Payer: Self-pay | Admitting: Family Medicine

## 2018-03-29 ENCOUNTER — Other Ambulatory Visit: Payer: Self-pay

## 2018-03-29 ENCOUNTER — Ambulatory Visit (INDEPENDENT_AMBULATORY_CARE_PROVIDER_SITE_OTHER): Payer: Self-pay | Admitting: Family Medicine

## 2018-03-29 ENCOUNTER — Encounter: Payer: Self-pay | Admitting: Family Medicine

## 2018-03-29 ENCOUNTER — Ambulatory Visit (INDEPENDENT_AMBULATORY_CARE_PROVIDER_SITE_OTHER): Payer: Self-pay

## 2018-03-29 VITALS — BP 105/71 | HR 89 | Temp 98.0°F | Resp 18 | Ht 67.0 in | Wt 186.0 lb

## 2018-03-29 DIAGNOSIS — R0602 Shortness of breath: Secondary | ICD-10-CM

## 2018-03-29 DIAGNOSIS — Z87891 Personal history of nicotine dependence: Secondary | ICD-10-CM

## 2018-03-29 DIAGNOSIS — J449 Chronic obstructive pulmonary disease, unspecified: Secondary | ICD-10-CM

## 2018-03-29 DIAGNOSIS — R9389 Abnormal findings on diagnostic imaging of other specified body structures: Secondary | ICD-10-CM

## 2018-03-29 DIAGNOSIS — R05 Cough: Secondary | ICD-10-CM

## 2018-03-29 DIAGNOSIS — Z7289 Other problems related to lifestyle: Secondary | ICD-10-CM

## 2018-03-29 DIAGNOSIS — R0600 Dyspnea, unspecified: Secondary | ICD-10-CM

## 2018-03-29 DIAGNOSIS — Z789 Other specified health status: Secondary | ICD-10-CM

## 2018-03-29 MED ORDER — MOMETASONE FURO-FORMOTEROL FUM 200-5 MCG/ACT IN AERO: 2.0000 | INHALATION_SPRAY | Freq: Two times a day (BID) | RESPIRATORY_TRACT | 5 refills | Status: DC

## 2018-03-29 MED ORDER — BENZONATATE 100 MG PO CAPS: 100.0000 mg | ORAL_CAPSULE | Freq: Three times a day (TID) | ORAL | 3 refills | Status: DC | PRN

## 2018-03-29 MED ORDER — AMOXICILLIN-POT CLAVULANATE 875-125 MG PO TABS: 1.0000 | ORAL_TABLET | Freq: Two times a day (BID) | ORAL | 0 refills | Status: DC

## 2018-03-29 MED ORDER — PREDNISONE 20 MG PO TABS: 60.0000 mg | ORAL_TABLET | Freq: Every day | ORAL | 0 refills | Status: AC

## 2018-03-29 MED ORDER — ALBUTEROL SULFATE HFA 108 (90 BASE) MCG/ACT IN AERS: 1.0000 | INHALATION_SPRAY | Freq: Four times a day (QID) | RESPIRATORY_TRACT | 5 refills | Status: DC | PRN

## 2018-03-29 MED ORDER — UMECLIDINIUM BROMIDE 62.5 MCG/INH IN AEPB: 1.0000 | INHALATION_SPRAY | Freq: Every day | RESPIRATORY_TRACT | 5 refills | Status: DC

## 2018-03-29 MED FILL — AMOX-CLAV 875-125 MG TABLET: 875-125 | 10 days supply | Qty: 20 | Fill #0

## 2018-03-29 MED FILL — predniSONE 20 MG TABS: 20 | 3 days supply | Qty: 9 | Fill #0

## 2018-03-29 MED FILL — BENZONATATE 100 MG CAPS: 100 | 10 days supply | Qty: 60 | Fill #0

## 2018-03-29 NOTE — Telephone Encounter (Signed)
Pt called returning a call from [place], please follow up .

## 2018-03-29 NOTE — Telephone Encounter (Signed)
Notify patient, recent chest x-ray was concerning for possible bronchitis vs possibly early pneumonia. Given recent hospitalization, poor improved of shortness of breath and cough with current therapy, I am placing him on antibiotics and short course of prednisone. Since these medications are acute prescriptions, he can pick them up today prior to 4:00 at [facility]. Medications old hold to send to preferred pharmacy. Follow-up with me is the same. Advise him, we are working to get him seen with pulmonology.

## 2018-03-29 NOTE — Progress Notes (Signed)
Patient ID: James [name], male    DOB: 1960-06-03, 58 y.o.   MRN: [id number]  PCP: Scot Jun, FNP  Chief Complaint  Patient presents with  . Hypertension  . COPD    Subjective:  HPI  James [name] is a 58 y.o. male presents for follow-up of COPD symptom and hypertension follow-up.  James [name] was seen in office on 03/14/18 for a hospital follow-up after experiencing a COPD exacerbation. At that time, James [name] complained of audible wheeze and shortness of breath with exertion, and a nonproductive cough. He was continued on LABA and SABA  Inhaled therapy, completing a prednisone taper, and PRN nebulizer treatments. Guaifenesin was added to his medication regimen to help with sputum pain. Patient is not prescribed home oxygen. He is present today with reports of a persistent mild cough which is occasionally productive of clear mucus, he feels shortness of breath has not improved, and continues to have wheezing which he reports is worse at night.  He reports brief improvement of activity and of breathing immediately after having a nebulizer treatment with gradual return of increased work of breathing.  He endorses mild to moderate relief of shortness of breath and wheezing with albuterol inhaler. He is not having persistent chest tightness or chest pain. He denies back pain, feeling feverish, chills, or URI symptoms. Patient has recently started taking folic acid and thiamine otc under the direction of his sister due to chronic alcohol use. No recent folate or B1 level checked. Patient unaware of any prior deficiencies. He is heavy drinker and consumes approximately a 6 pack of beer per day.  Social History   Socioeconomic History  . Marital status: Divorced    Spouse name: Not on file  . Number of children: Not on file  . Years of education: Not on file  . Highest education level: Not on file  Occupational History  . Occupation: Dealer  . Occupation: [employer]  . Financial  resource strain: Not on file  . Food insecurity:    Worry: Not on file    Inability: Not on file  . Transportation needs:    Medical: Not on file    Non-medical: Not on file  Tobacco Use  . Smoking status: Former Smoker    Packs/day: 1.50    Years: 40.00    Pack years: 60.00    Types: Cigarettes  . Smokeless tobacco: Current User    Types: Chew  Substance and Sexual Activity  . Alcohol use: Yes    Alcohol/week: 21.0 standard drinks    Types: 21 Cans of beer per week  . Drug use: Yes    Types: Marijuana    Comment: daily   . Sexual activity: Not Currently    Partners: Female  Lifestyle  . Physical activity:    Days per week: Not on file    Minutes per session: Not on file  . Stress: Not on file  Relationships  . Social connections:    Talks on phone: Not on file    Gets together: Not on file    Attends religious service: Not on file    Active member of club or organization: Not on file    Attends meetings of clubs or organizations: Not on file    Relationship status: Not on file  . Intimate partner violence:    Fear of current or ex partner: Not on file    Emotionally abused: Not on file    Physically abused: Not  on file    Forced sexual activity: Not on file  Other Topics Concern  . Not on file  Social History Narrative   Lives alone near [place]    Family History  Problem Relation Age of Onset  . Heart disease Father   . COPD Sister    Review of Systems Pertinent negatives listed in HPI Patient Active Problem List   Diagnosis Date Noted  . COPD with acute exacerbation (James [name]) 02/16/2018  . Hypertension 05/17/2016  . Low back pain radiating to both legs 04/18/2016  . History of tobacco abuse 04/06/2016  . Alcohol abuse 04/06/2016  . Elevated liver enzymes 04/06/2016  . COPD (chronic obstructive pulmonary disease) ([place]) 03/26/2016  . Acute on chronic respiratory failure with hypoxia ([facility]) 06/08/2013    No Known Allergies  Prior to Admission  medications   Medication Sig Start Date End Date Taking? Authorizing Provider  albuterol (PROVENTIL HFA;VENTOLIN HFA) 108 (90 Base) MCG/ACT inhaler Inhale 1-2 puffs into the lungs every 6 (six) hours as needed for wheezing or shortness of breath. 03/14/18   Scot Jun, FNP  albuterol (PROVENTIL) (2.5 MG/3ML) 0.083% nebulizer solution Take 3 mLs (2.5 mg total) by nebulization every 6 (six) hours as needed for wheezing or shortness of breath. 03/14/18   Scot Jun, FNP  benzonatate (TESSALON) 100 MG capsule Take 1-2 capsules (100-200 mg total) by mouth 3 (three) times daily as needed for cough. 03/14/18   Scot Jun, FNP  losartan (COZAAR) 25 MG tablet Take 1 tablet (25 mg total) by mouth daily. 03/14/18   Scot Jun, FNP  mometasone-formoterol (DULERA) 200-5 MCG/ACT AERO Inhale 2 puffs into the lungs 2 (two) times daily. 03/14/18   Scot Jun, FNP  umeclidinium bromide (INCRUSE ELLIPTA) 62.5 MCG/INH AEPB Inhale 1 puff into the lungs daily. 03/14/18   Scot Jun, FNP    Past Medical, Surgical Family and Social History reviewed and updated.    Objective:   Today's Vitals   03/29/18 1051  BP: 105/71  Pulse: 89  Resp: 18  Temp: 98 F (36.7 C)  TempSrc: Oral  SpO2: 95%  Weight: 186 lb (84.4 kg)  Height: 5\' 7"  (1.702 m)    Wt Readings from Last 3 Encounters:  03/29/18 186 lb (84.4 kg)  03/14/18 189 lb 3.2 oz (85.8 kg)  02/16/18 170 lb (77.1 kg)     Physical Exam General appearance: alert, well developed, cooperative and in no distress Head: Normocephalic, without obvious abnormality, atraumatic Respiratory: Anterior bilateral lungs diminished with mild audile wheeze Respirations unlabored, with normal l respiratory rate Heart: rate and rhythm normal. No gallop or murmurs noted on exam  Extremities: No gross deformities Skin: Skin color, texture, turgor normal. No rashes seen  Psych: Appropriate mood and affect. Neurologic: Mental status:  Alert, oriented to person, place, and time, thought content appropriate.  Assessment & Plan:  1. Chronic obstructive pulmonary disease, unspecified COPD type ([place]) 2. Dyspnea, unspecified type Obtaining chest x-ray to rule out pneumonia given no improvement of symptoms with current treatment and abnormal lung sounds auscultated on exam. Will check CBC. Oxygen saturation is normal-95%  3. Alcohol use Checking: - Vitamin B1 - Folate -Ok to continue supplements unless levels are elevated  4. New abnormality on chest x-ray Result Date: 03/29/2018: CLINICAL DATA:  Cough and shortness of breath. EXAM: CHEST - 2 VIEW COMPARISON:  February 16, 2018 FINDINGS: The cardiomediastinal silhouette is stable. No pneumothorax. Blunting of left costophrenic angle is stable and  may represent pleural thickening versus a tiny effusion. Healed left rib fractures are noted. Healed lower right rib fractures noted. No acute infiltrate or other abnormality. IMPRESSION: 1. Blunting of left costophrenic angle is stable and could represent pleural thickening versus a tiny effusion. 2. Healed bilateral rib fractures.  -Given nonspecific findings, however, recent change noted on chest x-ray, indicating possible bronchitis vs possible early pneumonia, will treat as follows: Prednisone 60 mg x 3 days and Augmentin 875/125 mg BID X 10 days -Will benefit from follow-up with pulmonology-needs PFTs. Referral placed last visit, however, delayed scheduling due to current viral pandemic.     -The patient was given clear instructions to go to ER or return to medical center if symptoms do not improve, worsen or new problems develop. The patient verbalized understanding.    Molli Barrows, FNP Primary Care at [facility] [address] [place] [phone]: [phone]

## 2018-03-29 NOTE — Telephone Encounter (Signed)
James [name],   If he could follow-up with [name] in 4-6 weeks that would be great!  Molli Barrows, FNP

## 2018-03-29 NOTE — Telephone Encounter (Signed)
Sure. About when would you want this patient to have a follow up?

## 2018-03-29 NOTE — Telephone Encounter (Signed)
Hey, is this something that you can assist with?

## 2018-03-29 NOTE — Telephone Encounter (Signed)
Patient & sister notified of chest xray results & recommendations. Expressed understanding. Would like Rxs sent to [facility]. Rxs released.

## 2018-03-29 NOTE — Patient Instructions (Addendum)
You will be notified via phone regarding the results of your chest x-ray, and any necessary medication changes.  Our pharmacy will mail your medications to you. I am adding a medication to help with cough, benzonatate 200 mg 3 times daily as needed. This will be mailed with other medications.   Our pharmacy doesn't stock Spiriva and medication is being substituted with Incruse Ellipita.  I am checking a folic acid and Thiamine level, if low you will need to continue supplements.  The pulmonologist  (lung doctor) will contact you via the preferred phone number listed to schedule your appointment.    Chronic Obstructive Pulmonary Disease Chronic obstructive pulmonary disease (COPD) is a long-term (chronic) lung problem. When you have COPD, it is hard for air to get in and out of your lungs. Usually the condition gets worse over time, and your lungs will never return to normal. There are things you can do to keep yourself as healthy as possible.  Your doctor may treat your condition with: ? Medicines. ? Oxygen. ? Lung surgery.  Your doctor may also recommend: ? Rehabilitation. This includes steps to make your body work better. It may involve a team of specialists. ? Quitting smoking, if you smoke. ? Exercise and changes to your diet. ? Comfort measures (palliative care). Follow these instructions at home: Medicines  Take over-the-counter and prescription medicines only as told by your doctor.  Talk to your doctor before taking any cough or allergy medicines. You may need to avoid medicines that cause your lungs to be dry. Lifestyle  If you smoke, stop. Smoking makes the problem worse. If you need help quitting, ask your doctor.  Avoid being around things that make your breathing worse. This may include smoke, chemicals, and fumes.  Stay active, but remember to rest as well.  Learn and use tips on how to relax.  Make sure you get enough sleep. Most adults need at least 7 hours of  sleep every night.  Eat healthy foods. Eat smaller meals more often. Rest before meals. Controlled breathing Learn and use tips on how to control your breathing as told by your doctor. Try:  Breathing in (inhaling) through your nose for 1 second. Then, pucker your lips and breath out (exhale) through your lips for 2 seconds.  Putting one hand on your belly (abdomen). Breathe in slowly through your nose for 1 second. Your hand on your belly should move out. Pucker your lips and breathe out slowly through your lips. Your hand on your belly should move in as you breathe out.  Controlled coughing Learn and use controlled coughing to clear mucus from your lungs. Follow these steps: 1. Lean your head a little forward. 2. Breathe in deeply. 3. Try to hold your breath for 3 seconds. 4. Keep your mouth slightly open while coughing 2 times. 5. Spit any mucus out into a tissue. 6. Rest and do the steps again 1 or 2 times as needed. General instructions  Make sure you get all the shots (vaccines) that your doctor recommends. Ask your doctor about a flu shot and a pneumonia shot.  Use oxygen therapy and pulmonary rehabilitation if told by your doctor. If you need home oxygen therapy, ask your doctor if you should buy a tool to measure your oxygen level (oximeter).  Make a COPD action plan with your doctor. This helps you to know what to do if you feel worse than usual.  Manage any other conditions you have as told by  your doctor.  Avoid going outside when it is very hot, cold, or humid.  Avoid people who have a sickness you can catch (contagious).  Keep all follow-up visits as told by your doctor. This is important. Contact a doctor if:  You cough up more mucus than usual.  There is a change in the color or thickness of the mucus.  It is harder to breathe than usual.  Your breathing is faster than usual.  You have trouble sleeping.  You need to use your medicines more often than  usual.  You have trouble doing your normal activities such as getting dressed or walking around the house. Get help right away if:  You have shortness of breath while resting.  You have shortness of breath that stops you from: ? Being able to talk. ? Doing normal activities.  Your chest hurts for longer than 5 minutes.  Your skin color is more blue than usual.  Your pulse oximeter shows that you have low oxygen for longer than 5 minutes.  You have a fever.  You feel too tired to breathe normally. Summary  Chronic obstructive pulmonary disease (COPD) is a long-term lung problem.  The way your lungs work will never return to normal. Usually the condition gets worse over time. There are things you can do to keep yourself as healthy as possible.  Take over-the-counter and prescription medicines only as told by your doctor.  If you smoke, stop. Smoking makes the problem worse. This information is not intended to replace advice given to you by your health care provider. Make sure you discuss any questions you have with your health care provider. Document Released: 06/09/2007 Document Revised: 01/26/2016 Document Reviewed: 01/26/2016 Elsevier Interactive Patient Education  2019 [employer].

## 2018-03-29 NOTE — Telephone Encounter (Signed)
James [name],  Could contact [facility] and inquire which days [name] will see pulmonology patients? I would like to have Mr. Jansson seen by [name] in the near future, although not emergent.   Thanks.  Molli Barrows, FNP

## 2018-03-29 NOTE — Telephone Encounter (Signed)
Left voice mail to call back 

## 2018-03-30 ENCOUNTER — Other Ambulatory Visit: Payer: Self-pay

## 2018-03-30 LAB — CBC
Hematocrit: 42.9 % (ref 37.5–51.0)
Hemoglobin: 14.7 g/dL (ref 13.0–17.7)
MCH: 33.3 pg — ABNORMAL HIGH (ref 26.6–33.0)
MCHC: 34.3 g/dL (ref 31.5–35.7)
MCV: 97 fL (ref 79–97)
PLATELETS: 228 10*3/uL (ref 150–450)
RBC: 4.42 x10E6/uL (ref 4.14–5.80)
RDW: 12.4 % (ref 11.6–15.4)
WBC: 7.4 10*3/uL (ref 3.4–10.8)

## 2018-03-30 LAB — VITAMIN B1

## 2018-03-30 LAB — FOLATE: Folate: 13.4 ng/mL (ref >3.0)

## 2018-03-30 MED ORDER — THIAMINE HCL 100 MG PO TABS: 100.0000 mg | ORAL_TABLET | Freq: Every day | ORAL | 0 refills | Status: DC

## 2018-03-30 MED ORDER — FOLIC ACID 1 MG PO TABS: 1.0000 mg | ORAL_TABLET | Freq: Every day | ORAL | 0 refills | Status: DC

## 2018-03-30 NOTE — Telephone Encounter (Signed)
Done

## 2018-03-30 NOTE — Progress Notes (Signed)
Patient notified of results & recommendations. Expressed understanding.

## 2018-03-31 MED FILL — FOLIC ACID 1 MG TABS: 1 | 90 days supply | Qty: 90 | Fill #0

## 2018-04-04 MED FILL — !DULERA 200 MCG/5 MCG INH: 200-5 | 30 days supply | Qty: 13 | Fill #0

## 2018-04-04 MED FILL — !INCRUSE ELLIPTA 62.5 MCG I: 62.5 | 30 days supply | Qty: 30 | Fill #0

## 2018-04-04 MED FILL — !VENTOLIN HFA INHALER: 108 (90 BAS | 25 days supply | Qty: 18 | Fill #0

## 2018-04-11 MED FILL — LOSARTAN POTASSIUM 25 MG TA: 25 | 30 days supply | Qty: 30 | Fill #1

## 2018-04-11 MED FILL — ALBUTEROL SUL 2.5 MG/3 ML S: (2.5 MG/3ML | 6 days supply | Qty: 75 | Fill #1

## 2018-04-27 ENCOUNTER — Encounter: Payer: Self-pay | Admitting: Critical Care Medicine

## 2018-04-27 ENCOUNTER — Ambulatory Visit: Payer: Self-pay | Attending: Critical Care Medicine | Admitting: Critical Care Medicine

## 2018-04-27 ENCOUNTER — Other Ambulatory Visit: Payer: Self-pay

## 2018-04-27 DIAGNOSIS — J441 Chronic obstructive pulmonary disease with (acute) exacerbation: Secondary | ICD-10-CM

## 2018-04-27 DIAGNOSIS — F1010 Alcohol abuse, uncomplicated: Secondary | ICD-10-CM

## 2018-04-27 DIAGNOSIS — I10 Essential (primary) hypertension: Secondary | ICD-10-CM

## 2018-04-27 DIAGNOSIS — R05 Cough: Secondary | ICD-10-CM

## 2018-04-27 DIAGNOSIS — R0789 Other chest pain: Secondary | ICD-10-CM

## 2018-04-27 MED ORDER — FLUTTER DEVI: 0 refills | Status: DC

## 2018-04-27 MED ORDER — PREDNISONE 10 MG PO TABS: ORAL_TABLET | ORAL | 0 refills | Status: DC

## 2018-04-27 MED ORDER — LOSARTAN POTASSIUM 100 MG PO TABS: 100.0000 mg | ORAL_TABLET | Freq: Every day | ORAL | 3 refills | Status: DC

## 2018-04-27 MED FILL — !VENTOLIN HFA INHALER: 108 (90 BAS | 25 days supply | Qty: 18 | Fill #1

## 2018-04-27 MED FILL — !DULERA 200 MCG/5 MCG INH: 200-5 | 30 days supply | Qty: 13 | Fill #1

## 2018-04-27 MED FILL — predniSONE 10 MG TABS: 10 | 16 days supply | Qty: 40 | Fill #0

## 2018-04-27 MED FILL — !INCRUSE ELLIPTA 62.5 MCG I: 62.5 | 30 days supply | Qty: 30 | Fill #1

## 2018-04-27 MED FILL — LOSARTAN POTASSIUM 100 MG T: 100 | 30 days supply | Qty: 30 | Fill #0
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
## 2018-04-27 NOTE — Progress Notes (Signed)
Pt states he can't get the congestion up  Pt states he was prescribed prednisone, it didn't help and he only took it for 3 days  Pt states he is having discomfort in his chest from coughing and trying to get the congestion up

## 2018-04-28 ENCOUNTER — Telehealth: Payer: Self-pay | Admitting: Family Medicine

## 2018-04-28 NOTE — Telephone Encounter (Signed)
Patients sister called because she is unsure if her brother should stop using the pro air, please follow up.

## 2018-04-28 NOTE — Telephone Encounter (Signed)
Pt advised to continue the Pro-Air. Please advise otherwise.

## 2018-04-29 MED FILL — ALBUTEROL SUL 2.5 MG/3 ML S: (2.5 MG/3ML | 7 days supply | Qty: 90 | Fill #2

## 2018-04-29 NOTE — Telephone Encounter (Signed)
Yes he should use proair as needed

## 2018-05-01 ENCOUNTER — Other Ambulatory Visit: Payer: Self-pay | Admitting: *Deleted

## 2018-05-01 NOTE — Telephone Encounter (Signed)
Pt has this medication on hand, did not realized it was the same medication she picked up from the pharmacy on 4/232/2020.  Explained to patient sister that, verbalized understanding.

## 2018-05-01 NOTE — Telephone Encounter (Signed)
Patient called requesting a refill of Pro Air inhaler, patient uses [facility] pharmacy, please follow up.

## 2018-05-17 ENCOUNTER — Other Ambulatory Visit: Payer: Self-pay

## 2018-05-17 ENCOUNTER — Encounter (HOSPITAL_COMMUNITY): Payer: Self-pay

## 2018-05-17 ENCOUNTER — Emergency Department (HOSPITAL_COMMUNITY): Payer: Self-pay

## 2018-05-17 ENCOUNTER — Observation Stay (HOSPITAL_COMMUNITY)
Admission: EM | Admit: 2018-05-17 | Discharge: 2018-05-18 | Disposition: A | Discharge status: Home / Self Care | Payer: Self-pay | Attending: Internal Medicine | Admitting: Internal Medicine

## 2018-05-17 DIAGNOSIS — R2681 Unsteadiness on feet: Secondary | ICD-10-CM | POA: Insufficient documentation

## 2018-05-17 DIAGNOSIS — J441 Chronic obstructive pulmonary disease with (acute) exacerbation: Principal | ICD-10-CM

## 2018-05-17 DIAGNOSIS — J189 Pneumonia, unspecified organism: Secondary | ICD-10-CM | POA: Diagnosis present

## 2018-05-17 DIAGNOSIS — J440 Chronic obstructive pulmonary disease with acute lower respiratory infection: Secondary | ICD-10-CM | POA: Insufficient documentation

## 2018-05-17 DIAGNOSIS — Z8249 Family history of ischemic heart disease and other diseases of the circulatory system: Secondary | ICD-10-CM | POA: Insufficient documentation

## 2018-05-17 DIAGNOSIS — Z20828 Contact with and (suspected) exposure to other viral communicable diseases: Secondary | ICD-10-CM | POA: Insufficient documentation

## 2018-05-17 DIAGNOSIS — F1010 Alcohol abuse, uncomplicated: Secondary | ICD-10-CM | POA: Insufficient documentation

## 2018-05-17 DIAGNOSIS — Z7952 Long term (current) use of systemic steroids: Secondary | ICD-10-CM | POA: Insufficient documentation

## 2018-05-17 DIAGNOSIS — I10 Essential (primary) hypertension: Secondary | ICD-10-CM | POA: Insufficient documentation

## 2018-05-17 DIAGNOSIS — Z87891 Personal history of nicotine dependence: Secondary | ICD-10-CM | POA: Insufficient documentation

## 2018-05-17 DIAGNOSIS — D72829 Elevated white blood cell count, unspecified: Secondary | ICD-10-CM | POA: Insufficient documentation

## 2018-05-17 DIAGNOSIS — Z7951 Long term (current) use of inhaled steroids: Secondary | ICD-10-CM | POA: Insufficient documentation

## 2018-05-17 DIAGNOSIS — Z79899 Other long term (current) drug therapy: Secondary | ICD-10-CM | POA: Insufficient documentation

## 2018-05-17 HISTORY — DX: Pneumonia, unspecified organism: J18.9

## 2018-05-17 LAB — COMPREHENSIVE METABOLIC PANEL
ALT: 34 U/L (ref 0–44)
AST: 32 U/L (ref 15–41)
Albumin: 3.8 g/dL (ref 3.5–5.0)
Alkaline Phosphatase: 36 U/L — ABNORMAL LOW (ref 38–126)
Anion gap: 12 (ref 5–15)
BUN: 14 mg/dL (ref 6–20)
CO2: 22 mmol/L (ref 22–32)
Calcium: 9 mg/dL (ref 8.9–10.3)
Chloride: 104 mmol/L (ref 98–111)
Creatinine, Ser: 1.03 mg/dL (ref 0.61–1.24)
GFR calc Af Amer: >60 mL/min (ref >60)
GFR calc non Af Amer: >60 mL/min (ref >60)
Glucose, Bld: 127 mg/dL — ABNORMAL HIGH (ref 70–99)
Potassium: 4 mmol/L (ref 3.5–5.1)
Sodium: 138 mmol/L (ref 135–145)
Total Bilirubin: 0.5 mg/dL (ref 0.3–1.2)
Total Protein: 6 g/dL — ABNORMAL LOW (ref 6.5–8.1)

## 2018-05-17 LAB — CBC WITH DIFFERENTIAL/PLATELET
Abs Immature Granulocytes: 0.05 10*3/uL (ref 0.00–0.07)
Basophils Absolute: 0.1 10*3/uL (ref 0.0–0.1)
Basophils Relative: 0 %
Eosinophils Absolute: 0.2 10*3/uL (ref 0.0–0.5)
Eosinophils Relative: 1 %
HCT: 46.9 % (ref 39.0–52.0)
Hemoglobin: 15.7 g/dL (ref 13.0–17.0)
Immature Granulocytes: 0 %
Lymphocytes Relative: 6 %
Lymphs Abs: 1 10*3/uL (ref 0.7–4.0)
MCH: 33.3 pg (ref 26.0–34.0)
MCHC: 33.5 g/dL (ref 30.0–36.0)
MCV: 99.4 fL (ref 80.0–100.0)
Monocytes Absolute: 0.8 10*3/uL (ref 0.1–1.0)
Monocytes Relative: 5 %
Neutro Abs: 15.4 10*3/uL — ABNORMAL HIGH (ref 1.7–7.7)
Neutrophils Relative %: 88 %
Platelets: 239 10*3/uL (ref 150–400)
RBC: 4.72 MIL/uL (ref 4.22–5.81)
RDW: 12.6 % (ref 11.5–15.5)
WBC: 17.4 10*3/uL — ABNORMAL HIGH (ref 4.0–10.5)
nRBC: 0 % (ref 0.0–0.2)

## 2018-05-17 LAB — SARS CORONAVIRUS 2 BY RT PCR (HOSPITAL ORDER, PERFORMED IN ~~LOC~~ HOSPITAL LAB): SARS Coronavirus 2: NEGATIVE

## 2018-05-17 LAB — LACTATE DEHYDROGENASE: LDH: 242 U/L — ABNORMAL HIGH (ref 98–192)

## 2018-05-17 LAB — LACTIC ACID, PLASMA
Lactic Acid, Venous: 1.7 mmol/L (ref 0.5–1.9)
Lactic Acid, Venous: 2.2 mmol/L (ref 0.5–1.9)

## 2018-05-17 LAB — FERRITIN: Ferritin: 67 ng/mL (ref 24–336)

## 2018-05-17 LAB — TRIGLYCERIDES: Triglycerides: 52 mg/dL (ref <150)

## 2018-05-17 LAB — C-REACTIVE PROTEIN: CRP: <0.8 mg/dL (ref <1.0)

## 2018-05-17 LAB — BRAIN NATRIURETIC PEPTIDE: B Natriuretic Peptide: 31.4 pg/mL (ref 0.0–100.0)

## 2018-05-17 LAB — PROCALCITONIN: Procalcitonin: <0.1 ng/mL

## 2018-05-17 LAB — D-DIMER, QUANTITATIVE: D-Dimer, Quant: <0.27 ug/mL-FEU (ref 0.00–0.50)

## 2018-05-17 LAB — FIBRINOGEN: Fibrinogen: 277 mg/dL (ref 210–475)

## 2018-05-17 LAB — TROPONIN I: Troponin I: <0.03 ng/mL (ref <0.03)

## 2018-05-17 MED ORDER — AZITHROMYCIN 500 MG PO TABS
250.0000 mg | ORAL_TABLET | Freq: Every day | ORAL | Status: DC
  Administered 2018-05-18: 250 mg via ORAL
  Filled 2018-05-17: qty 1

## 2018-05-17 MED ORDER — ORAL CARE MOUTH RINSE
15.0000 mL | Freq: Two times a day (BID) | OROMUCOSAL | Status: DC
  Administered 2018-05-18: 15 mL via OROMUCOSAL

## 2018-05-17 MED ORDER — ADULT MULTIVITAMIN W/MINERALS CH
1.0000 | ORAL_TABLET | Freq: Every day | ORAL | Status: DC
  Administered 2018-05-17 – 2018-05-18 (×2): 1 via ORAL
  Filled 2018-05-17 (×2): qty 1

## 2018-05-17 MED ORDER — ALBUTEROL SULFATE HFA 108 (90 BASE) MCG/ACT IN AERS
8.0000 | INHALATION_SPRAY | Freq: Once | RESPIRATORY_TRACT | Status: AC
  Administered 2018-05-17: 10:00:00 8 via RESPIRATORY_TRACT
  Filled 2018-05-17: qty 6.7

## 2018-05-17 MED ORDER — IPRATROPIUM-ALBUTEROL 0.5-2.5 (3) MG/3ML IN SOLN
3.0000 mL | Freq: Four times a day (QID) | RESPIRATORY_TRACT | Status: DC
  Administered 2018-05-17 – 2018-05-18 (×3): 3 mL via RESPIRATORY_TRACT
  Filled 2018-05-17 (×3): qty 3

## 2018-05-17 MED ORDER — THIAMINE HCL 100 MG/ML IJ SOLN: 100.0000 mg | Freq: Every day | INTRAMUSCULAR | Status: DC

## 2018-05-17 MED ORDER — LORAZEPAM 1 MG PO TABS
1.0000 mg | ORAL_TABLET | Freq: Four times a day (QID) | ORAL | Status: DC | PRN
  Administered 2018-05-17 – 2018-05-18 (×2): 1 mg via ORAL
  Filled 2018-05-17 (×2): qty 1

## 2018-05-17 MED ORDER — FOLIC ACID 1 MG PO TABS
1.0000 mg | ORAL_TABLET | Freq: Every day | ORAL | Status: DC
  Administered 2018-05-17 – 2018-05-18 (×2): 1 mg via ORAL
  Filled 2018-05-17 (×2): qty 1

## 2018-05-17 MED ORDER — SODIUM CHLORIDE 0.9 % IV SOLN
1.0000 g | INTRAVENOUS | Status: DC
  Administered 2018-05-18: 1 g via INTRAVENOUS
  Filled 2018-05-17: qty 10

## 2018-05-17 MED ORDER — METHYLPREDNISOLONE SODIUM SUCC 125 MG IJ SOLR
125.0000 mg | Freq: Once | INTRAMUSCULAR | Status: AC
  Administered 2018-05-17: 09:00:00 125 mg via INTRAVENOUS
  Filled 2018-05-17: qty 2

## 2018-05-17 MED ORDER — SODIUM CHLORIDE 0.9 % IV SOLN
1.0000 g | Freq: Once | INTRAVENOUS | Status: AC
  Administered 2018-05-17: 1 g via INTRAVENOUS
  Filled 2018-05-17: qty 10

## 2018-05-17 MED ORDER — ENOXAPARIN SODIUM 40 MG/0.4ML ~~LOC~~ SOLN
40.0000 mg | SUBCUTANEOUS | Status: DC
  Administered 2018-05-17 – 2018-05-18 (×2): 40 mg via SUBCUTANEOUS
  Filled 2018-05-17 (×2): qty 0.4

## 2018-05-17 MED ORDER — ACETAMINOPHEN 650 MG RE SUPP: 650.0000 mg | Freq: Four times a day (QID) | RECTAL | Status: DC | PRN

## 2018-05-17 MED ORDER — LORAZEPAM 2 MG/ML IJ SOLN: 1.0000 mg | Freq: Four times a day (QID) | INTRAMUSCULAR | Status: DC | PRN

## 2018-05-17 MED ORDER — MAGNESIUM SULFATE 2 GM/50ML IV SOLN
2.0000 g | Freq: Once | INTRAVENOUS | Status: AC
  Administered 2018-05-17: 2 g via INTRAVENOUS
  Filled 2018-05-17: qty 50

## 2018-05-17 MED ORDER — VITAMIN B-1 100 MG PO TABS
100.0000 mg | ORAL_TABLET | Freq: Every day | ORAL | Status: DC
  Administered 2018-05-17 – 2018-05-18 (×2): 100 mg via ORAL
  Filled 2018-05-17 (×2): qty 1

## 2018-05-17 MED ORDER — ACETAMINOPHEN 325 MG PO TABS
650.0000 mg | ORAL_TABLET | Freq: Four times a day (QID) | ORAL | Status: DC | PRN
  Filled 2018-05-17: qty 2

## 2018-05-17 MED ORDER — MOMETASONE FURO-FORMOTEROL FUM 200-5 MCG/ACT IN AERO
2.0000 | INHALATION_SPRAY | Freq: Two times a day (BID) | RESPIRATORY_TRACT | Status: DC
  Administered 2018-05-17 – 2018-05-18 (×3): 2 via RESPIRATORY_TRACT
  Filled 2018-05-17: qty 8.8

## 2018-05-17 MED ORDER — SODIUM CHLORIDE 0.9 % IV BOLUS
500.0000 mL | Freq: Once | INTRAVENOUS | Status: AC
  Administered 2018-05-17: 10:00:00 500 mL via INTRAVENOUS

## 2018-05-17 MED ORDER — PREDNISONE 20 MG PO TABS
40.0000 mg | ORAL_TABLET | Freq: Every day | ORAL | Status: DC
  Administered 2018-05-18: 40 mg via ORAL
  Filled 2018-05-17: qty 2

## 2018-05-17 MED ORDER — SODIUM CHLORIDE 0.9 % IV SOLN
500.0000 mg | Freq: Once | INTRAVENOUS | Status: AC
  Administered 2018-05-17: 11:00:00 500 mg via INTRAVENOUS
  Filled 2018-05-17: qty 500

## 2018-05-17 NOTE — H&P (Addendum)
Date: 05/17/2018               Patient Name:  James [name] MRN: [id number]  DOB: May 11, 1960 Age / Sex: 58 y.o., male   PCP: Scot Jun, FNP         Medical Service: Internal Medicine Teaching Service         Attending Physician: Dr. Bartholomew Crews, MD    First Contact: Dr. Sharon Seller Pager: [phone]  Second Contact: Dr. Frederico Hamman Pager: [phone]       After Hours (After 5p/  First Contact Pager: [phone]  weekends / holidays): Second Contact Pager: [phone]   Chief Complaint: Cough, shortness of breath   History of Present Illness: Patient is a 58 yo M with pmhx of HTN, COPD, and alcohol use disorder presenting today with acute onset cough and shortness of breath. Patient says he was in his normal state of health until 3 am this morning when he woke from sleep with sudden difficulty breathing and cough. He denies chest pain, heart palpitations, and lower extremity swelling. No fevers, chills, or sick contacts. Patient reports he felt fine the day before. 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 He is a former chronic smoker (80 pack year) but quit 5 years ago. He has a diagnosis of COPD and says that he follows with a pulmonologist (no records). He is on chronic prednisone but unsure of the dose. Says he has been taking 2 tablets for at least the past month. Reports compliance with his inhalers.   On arrival to the ED, patient was afebrile T 97.9 with BP 132/63, HR 115, RR 29, and oxygen 94% on RA. He was placed on 2L [place] for comfort. Labs notable for a leukocytosis of 17.4. CMP with normal electrolytes, renal function, and liver function. D-dimer was undetectable < 0.27. Procalcitonin was also low, < 0.10. Troponin I < 0.03. LDH mildly elevated 242. Rapid COVID-19 PCR was negative. Lactic acid was mildly elevated 2.2, but improved to 1.7 after 500 cc NS bolus. CXR demonstrated a right middle lobe consolidation, new  from prior imaging. He received IV ceftriaxone, azithromycin, 125 mg IV solumedrol, and 8 puffs of an albuterol metered dose inhaler.   Meds:  Current Meds  Medication Sig  . albuterol (PROVENTIL HFA;VENTOLIN HFA) 108 (90 Base) MCG/ACT inhaler Inhale 1-2 puffs into the lungs every 6 (six) hours as needed for wheezing or shortness of breath.  [name] albuterol (PROVENTIL) (2.5 MG/3ML) 0.083% nebulizer solution Take 3 mLs (2.5 mg total) by nebulization every 6 (six) hours as needed for wheezing or shortness of breath.  . benzonatate (TESSALON) 100 MG capsule Take 1-2 capsules (100-200 mg total) by mouth 3 (three) times daily as needed for cough.  . folic acid (FOLVITE) 1 MG tablet Take 1 tablet (1 mg total) by mouth daily.  [name] losartan (COZAAR) 100 MG tablet Take 1 tablet (100 mg total) by mouth daily.  . mometasone-formoterol (DULERA) 200-5 MCG/ACT AERO Inhale 2 puffs into the lungs 2 (two) times daily.  . predniSONE (DELTASONE) 10 MG tablet Take 4 for four days 3 for four days 2 for four days 1 for four days  . umeclidinium bromide (INCRUSE ELLIPTA) 62.5 MCG/INH AEPB Inhale 1 puff into the lungs daily.     Allergies: Allergies as of 05/17/2018  . (No Known Allergies)   Past Medical History:  Diagnosis Date  . COPD (  chronic obstructive pulmonary disease) ([place])   . 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 Quit smoking 5 years ago. Previously smoked 2 PPD, started at age 44. Drinks a six pack of beer a night. Denies ever experiencing withdrawal before. He is contemplating quitting. Denies illicit substance use.   Review of Systems: A complete ROS was negative except as per HPI.   Physical Exam: Blood pressure 121/88, pulse (!) 113, temperature 97.9 F (36.6 C), temperature source Oral, resp. rate (!) 25, height 5' 9.5" (1.765 m),  weight 82.6 kg, SpO2 93 %. Constitutional: NAD, appears comfortable HEENT: Atraumatic, normocephalic. PERRL, anicteric sclera.  Neck: Supple, trachea midline.  Cardiovascular: tachycardic but regular, no murmurs, rubs, or gallops.  Pulmonary/Chest: Bilateral inspiratory and expiratory wheezing with increased work of breathing, dyspneic with conversation Abdominal: Soft, non tender, non distended. +BS.  Extremities: Warm and well perfused. No edema.  Neurological: A&Ox3, CN II - XII grossly intact.  Skin: No rashes or erythema  Psychiatric: Normal mood and affect   EKG: personally reviewed my interpretation is: Sinus tachycardia  CXR: personally reviewed my interpretation is: New right middle lobe opacification, small left pleural effusion    Assessment & Plan by Problem: Active Problems:   Pneumonia  James [name] is a 58 yo M with a pmhx of HTN, COPD on chronic prednisone, and alcohol use disorder presenting with 1 day of cough and SOB. Found to have a right middle lobe pneumonia.  COPD Exacerbation Right Middle Lobe PNA Afebrile but with leukocytosis of 17, although patient is on chronic prednisone which is likely contributing. Interestingly pro calcitonin is low. Given the acute onset of symptoms that woke him from sleep, I question an aspiration event with his nightly alcohol use. Pretest probability of PE is low and D-dimer was negative. He is not hypoxic but is tachycardic with significant bilateral wheezing, increased work of breathing, and dyspnea with conversation. S/p IV solumedrol in ED.  -- Continue home dulera BID -- Hold incruse -- Duonebs q6h while awake -- Increase prednisone 40 mg daily starting tomorrow to complete 5 day course; will likely need a taper  -- Continue ceftriaxone & azithromycin   Alcohol Use Disorder: Drinks a 6 pack of beer a night. Has been drinking "all his life". Denies ever experiencing withdrawal. He has contemplated quitting.  -- CIWA with ativan   -- Telemetry  -- Daily thiamine, folate, & multivitamin  -- Encourage cessation; consider naltrexone   HTN: Currently normotensive  -- Hold losartan; resume in AM if stable   FEN: No fluids, replete lytes prn, HH diet VTE ppx: Lovenox  Code Status: FULL    Dispo: Admit patient to Observation with expected length of stay less than 2 midnights.  SignedVelna Ochs, MD 05/17/2018, 12:10 PM  Pager: [phone]

## 2018-05-17 NOTE — ED Provider Notes (Addendum)
[facility] EMERGENCY DEPARTMENT Provider Note   CSN: [id number] Arrival date & time: 05/17/18  2355    History   Chief Complaint Chief Complaint  Patient presents with  . Shortness of Breath    HPI James [name] is a 58 y.o. male.  He has a history of significant COPD and is in and out of the emergency department for such.  He is complaining of worsening shortness of breath and shaking chills since 3 AM this morning.  He denies any recent illness.  No known fever sore throat runny nose.  Has a nonproductive cough.  No chest pain no abdominal pain vomiting or diarrhea.  No recent travel or sick contacts.  He has tried an inhaler without any improvement.     The history is provided by the patient and the EMS personnel.  Shortness of Breath  Severity:  Severe Onset quality:  Gradual Progression:  Unchanged Chronicity:  Recurrent Relieved by:  Nothing Worsened by:  Activity Ineffective treatments:  Inhaler Associated symptoms: cough and wheezing   Associated symptoms: no abdominal pain, no chest pain, no diaphoresis, no fever, no headaches, no hemoptysis, no neck pain, no rash, no sore throat, no sputum production, no syncope and no vomiting     Past Medical History:  Diagnosis Date  . COPD (chronic obstructive pulmonary disease) ([place])   . Pneumothorax    2016, fell from ladder    Patient Active Problem List   Diagnosis Date Noted  . COPD with acute exacerbation ([place]) 02/16/2018  . Hypertension 05/17/2016  . Low back pain radiating to both legs 04/18/2016  . History of tobacco abuse 04/06/2016  . Alcohol abuse 04/06/2016  . Elevated liver enzymes 04/06/2016  . COPD (chronic obstructive pulmonary disease) ([place]) 03/26/2016  . Acute on chronic respiratory failure with hypoxia ([place]) 06/08/2013    Past Surgical History:  Procedure Laterality Date  . VIDEO ASSISTED THORACOSCOPY (VATS)/THOROCOTOMY Left 06/11/2013   Procedure: VIDEO ASSISTED THORACOSCOPY  (VATS)/THOROCOTOMY;  Surgeon: Ivin Poot, MD;  Location: [facility];  Service: Thoracic;  Laterality: Left;  [name] VIDEO BRONCHOSCOPY N/A 06/11/2013   Procedure: VIDEO BRONCHOSCOPY;  Surgeon: Ivin Poot, MD;  Location: [facility] OR;  Service: Thoracic;  Laterality: N/A;        Home Medications    Prior to Admission medications   Medication Sig Start Date End Date Taking? Authorizing Provider  albuterol (PROVENTIL HFA;VENTOLIN HFA) 108 (90 Base) MCG/ACT inhaler Inhale 1-2 puffs into the lungs every 6 (six) hours as needed for wheezing or shortness of breath. 03/29/18   Scot Jun, FNP  albuterol (PROVENTIL) (2.5 MG/3ML) 0.083% nebulizer solution Take 3 mLs (2.5 mg total) by nebulization every 6 (six) hours as needed for wheezing or shortness of breath. 03/14/18   Scot Jun, FNP  benzonatate (TESSALON) 100 MG capsule Take 1-2 capsules (100-200 mg total) by mouth 3 (three) times daily as needed for cough. 03/29/18   Scot Jun, FNP  folic acid (FOLVITE) 1 MG tablet Take 1 tablet (1 mg total) by mouth daily. 03/30/18   Scot Jun, FNP  losartan (COZAAR) 100 MG tablet Take 1 tablet (100 mg total) by mouth daily. 04/27/18   [name], MD  mometasone-formoterol (DULERA) 200-5 MCG/ACT AERO Inhale 2 puffs into the lungs 2 (two) times daily. 03/29/18   Scot Jun, FNP  predniSONE (DELTASONE) 10 MG tablet Take 4 for four days 3 for four days 2 for four days 1 for  four days 04/27/18   [name], MD  Respiratory Therapy Supplies (FLUTTER) DEVI Use 4 times daily 04/27/18   [name], MD  thiamine 100 MG tablet Take 1 tablet (100 mg total) by mouth daily. 03/30/18   Scot Jun, FNP  umeclidinium bromide (INCRUSE ELLIPTA) 62.5 MCG/INH AEPB Inhale 1 puff into the lungs daily. 03/29/18   Scot Jun, FNP    Family History Family History  Problem Relation Age of Onset  . Heart disease Father   . COPD Sister     Social History Social History    Tobacco Use  . Smoking status: Former Smoker    Packs/day: 1.50    Years: 40.00    Pack years: 60.00    Types: Cigarettes  . Smokeless tobacco: Current User    Types: Chew  Substance Use Topics  . Alcohol use: Yes    Alcohol/week: 21.0 standard drinks    Types: 21 Cans of beer per week  . Drug use: Yes    Types: Marijuana    Comment: daily      Allergies   Patient has no known allergies.   Review of Systems Review of Systems  Constitutional: Positive for chills. Negative for diaphoresis and fever.  HENT: Negative for sore throat.   Eyes: Negative for visual disturbance.  Respiratory: Positive for cough, shortness of breath and wheezing. Negative for hemoptysis and sputum production.   Cardiovascular: Negative for chest pain and syncope.  Gastrointestinal: Negative for abdominal pain and vomiting.  Genitourinary: Negative for dysuria.  Musculoskeletal: Negative for neck pain.  Skin: Negative for rash.  Neurological: Negative for headaches.     Physical Exam Updated Vital Signs BP (!) 142/96 (BP Location: Right Arm)   Pulse 75   Temp 97.7 F (36.5 C) (Oral)   Resp (!) 21   Ht 5\' 9"  (1.753 m)   Wt 85 kg   SpO2 99%   BMI 27.67 kg/m   Physical Exam Vitals signs and nursing note reviewed.  Constitutional:      Appearance: He is well-developed. He is not diaphoretic.  HENT:     Head: Normocephalic and atraumatic.  Eyes:     Conjunctiva/sclera: Conjunctivae normal.  Neck:     Musculoskeletal: Neck supple.  Cardiovascular:     Rate and Rhythm: Regular rhythm. Tachycardia present.     Heart sounds: No murmur.  Pulmonary:     Effort: Tachypnea and accessory muscle usage present. No respiratory distress.     Breath sounds: Wheezing present.  Abdominal:     Palpations: Abdomen is soft.     Tenderness: There is no abdominal tenderness.  Musculoskeletal: Normal range of motion.        General: No tenderness.     Right lower leg: He exhibits no tenderness. No  edema.     Left lower leg: He exhibits no tenderness. No edema.  Skin:    General: Skin is warm and dry.     Capillary Refill: Capillary refill takes less than 2 seconds.  Neurological:     General: No focal deficit present.     Mental Status: He is alert.     Sensory: No sensory deficit.     Motor: No weakness.      ED Treatments / Results  Labs (all labs ordered are listed, but only abnormal results are displayed) Labs Reviewed  CBC WITH DIFFERENTIAL/PLATELET - Abnormal; Notable for the following components:      Result Value  WBC 17.4 (*)    Neutro Abs 15.4 (*)    All other components within normal limits  COMPREHENSIVE METABOLIC PANEL - Abnormal; Notable for the following components:   Glucose, Bld 127 (*)    Total Protein 6.0 (*)    Alkaline Phosphatase 36 (*)    All other components within normal limits  LACTATE DEHYDROGENASE - Abnormal; Notable for the following components:   LDH 242 (*)    All other components within normal limits  LACTIC ACID, PLASMA - Abnormal; Notable for the following components:   Lactic Acid, Venous 2.2 (*)    All other components within normal limits  BASIC METABOLIC PANEL - Abnormal; Notable for the following components:   Glucose, Bld 119 (*)    All other components within normal limits  CBC - Abnormal; Notable for the following components:   WBC 18.4 (*)    All other components within normal limits  SARS CORONAVIRUS 2 (HOSPITAL ORDER, [place] [facility])  CULTURE, BLOOD (ROUTINE X 2)  CULTURE, BLOOD (ROUTINE X 2)  D-DIMER, QUANTITATIVE (NOT AT [facility])  PROCALCITONIN  FERRITIN  TRIGLYCERIDES  FIBRINOGEN  C-REACTIVE PROTEIN  TROPONIN I  BRAIN NATRIURETIC PEPTIDE  LACTIC ACID, PLASMA    EKG EKG Interpretation  Date/Time:  Wednesday May 17 2018 08:45:02 EDT Ventricular Rate:  116 PR Interval:    QRS Duration: 89 QT Interval:  313 QTC Calculation: 435 R Axis:   75 Text Interpretation:  Sinus tachycardia  Anterior infarct, old increased rate from prior 2/20 Confirmed by Aletta Edouard [phone]) on 05/17/2018 8:52:05 AM Also confirmed by Aletta Edouard [phone]), editor Philomena Doheny [phone])  on 05/17/2018 3:54:53 PM   Radiology Dg Chest Port 1 View  Result Date: 05/17/2018 CLINICAL DATA:  Shortness of breath and cough EXAM: PORTABLE CHEST 1 VIEW COMPARISON:  March 29, 2018 FINDINGS: There is patchy airspace opacity in the right base. There is blunting of the left costophrenic angle, stable, likely due to scarring. Minimal left pleural effusion is possible in this area. Lungs elsewhere clear. Heart size and pulmonary vascularity normal. No adenopathy. There old healed rib fractures bilaterally. IMPRESSION: Patchy airspace opacity consistent with pneumonia in the right base. Probable scarring lateral left base, although minimal chronic left pleural effusion is possible. Stable cardiac silhouette. No evident adenopathy. Electronically Signed   By: Lowella Grip III M.D.   On: 05/17/2018 09:33    Procedures .Critical Care Performed by: Hayden Rasmussen, MD Authorized by: Hayden Rasmussen, MD   Critical care provider statement:    Critical care time (minutes):  45   Critical care time was exclusive of:  Separately billable procedures and treating other patients   Critical care was necessary to treat or prevent imminent or life-threatening deterioration of the following conditions:  Respiratory failure   Critical care was time spent personally by me on the following activities:  Discussions with consultants, evaluation of patient's response to treatment, examination of patient, ordering and performing treatments and interventions, ordering and review of laboratory studies, ordering and review of radiographic studies, pulse oximetry, re-evaluation of patient's condition, obtaining history from patient or surrogate, review of old charts and development of treatment plan with patient or surrogate   I  assumed direction of critical care for this patient from another provider in my specialty: no     (including critical care time)  Medications Ordered in ED Medications  mometasone-formoterol (DULERA) 200-5 MCG/ACT inhaler 2 puff (2 puffs Inhalation Given 05/17/18 2056)  enoxaparin (LOVENOX) injection 40 mg (40 mg Subcutaneous Given 05/17/18 1338)  acetaminophen (TYLENOL) tablet 650 mg (has no administration in time range)    Or  acetaminophen (TYLENOL) suppository 650 mg (has no administration in time range)  ipratropium-albuterol (DUONEB) 0.5-2.5 (3) MG/3ML nebulizer solution 3 mL (3 mLs Nebulization Not Given 05/18/18 0132)  predniSONE (DELTASONE) tablet 40 mg (has no administration in time range)  LORazepam (ATIVAN) tablet 1 mg (1 mg Oral Given 05/18/18 0525)    Or  LORazepam (ATIVAN) injection 1 mg ( Intravenous See Alternative 05/18/18 0525)  thiamine (VITAMIN B-1) tablet 100 mg (100 mg Oral Given 05/17/18 1338)    Or  thiamine (B-1) injection 100 mg ( Intravenous See Alternative 1/57/26 2035)  folic acid (FOLVITE) tablet 1 mg (1 mg Oral Given 05/17/18 1338)  multivitamin with minerals tablet 1 tablet (1 tablet Oral Given 05/17/18 1338)  cefTRIAXone (ROCEPHIN) 1 g in sodium chloride 0.9 % 100 mL IVPB (has no administration in time range)  azithromycin (ZITHROMAX) tablet 250 mg (has no administration in time range)  MEDLINE mouth rinse (15 mLs Mouth Rinse Not Given 05/17/18 2255)  magnesium sulfate IVPB 2 g 50 mL (0 g Intravenous Stopped 05/17/18 1055)  methylPREDNISolone sodium succinate (SOLU-MEDROL) 125 mg/2 mL injection 125 mg (125 mg Intravenous Given 05/17/18 0929)  sodium chloride 0.9 % bolus 500 mL (0 mLs Intravenous Stopped 05/17/18 1125)  albuterol (VENTOLIN HFA) 108 (90 Base) MCG/ACT inhaler 8 puff (8 puffs Inhalation Given 05/17/18 0942)  cefTRIAXone (ROCEPHIN) 1 g in sodium chloride 0.9 % 100 mL IVPB (0 g Intravenous Stopped 05/17/18 1124)  azithromycin (ZITHROMAX) 500 mg in sodium  chloride 0.9 % 250 mL IVPB (0 mg Intravenous Stopped 05/17/18 1205)     Initial Impression / Assessment and Plan / ED Course  I have reviewed the triage vital signs and the nursing notes.  Pertinent labs & imaging results that were available during my care of the patient were reviewed by me and considered in my medical decision making (see chart for details).  Clinical Course as of May 18 603  Wed May 17, 5823  2837 58 year old male former smoker with known history of COPD on and off steroids here with worsening shortness of breath in the setting of shaking chills that started at 3 AM.  Differential diagnosis includes COPD exacerbation, pneumonia, Covid, pneumothorax, bronchitis, ACS.  He is tachypneic and tachycardic but not particularly hypoxic.  He is got diffuse wheezing on exams.  He is afebrile here.   [MB]  5974 Patient's lab work came back and he has an elevated white blood cell count of 17,000.  His chest x-ray which was reviewed by me shows a right lower lobe infiltrate.  Have ordered him ceftriaxone and Zithromax.  He will need to be admitted to the hospital for his continued work of breathing and treatment of this infection.  I updated the patient on the results of this test and he is in agreement with admission.   [MB]  1638 Discussed with internal medicine who will admit the patient to their service for continued management.   [MB]    Clinical Course User Index [MB] Hayden Rasmussen, MD   [name] was evaluated in Emergency Department on 05/17/2018 for the symptoms described in the history of present illness. He was evaluated in the context of the global COVID-19 pandemic, which necessitated consideration that the patient might be at risk for infection with the SARS-CoV-2 virus that causes COVID-19. Institutional protocols  and algorithms that pertain to the evaluation of patients at risk for COVID-19 are in a state of rapid change based on information released by regulatory  bodies including the CDC and federal and state organizations. These policies and algorithms were followed during the patient's care in the ED.       Final Clinical Impressions(s) / ED Diagnoses   Final diagnoses:  COPD exacerbation ([place])  Pneumonia of right lower lobe due to infectious organism [facility])    ED Discharge Orders    None       Hayden Rasmussen, MD 05/18/18 8118    Hayden Rasmussen, MD 05/29/18 [phone]

## 2018-05-17 NOTE — ED Triage Notes (Signed)
Pt BIB [facility] for shortness of breath. Per EMS patient woke up with shortness of breath and a productive cough around 0300 this morning. Pt used his inhaler at home and took his steroid. EMS reports 98% on room air. Pt was afebrile upon ED arrival, 94% on room air. Pt is alert and oriented x4 at present time.

## 2018-05-17 NOTE — ED Notes (Signed)
ED TO INPATIENT HANDOFF REPORT  ED Nurse Name and Phone #:  Elmyra Ricks [phone]  S Name/Age/Gender James [name] 58 y.o. male Room/Bed: 026C/026C  Code Status   Code Status: Prior  Home/SNF/Other Home Patient oriented to: self, place, time and situation Is this baseline? Yes   Triage Complete: Triage complete  Chief Complaint SOB  Triage Note Pt BIB [facility] for shortness of breath. Per EMS patient woke up with shortness of breath and a productive cough around 0300 this morning. Pt used his inhaler at home and took his steroid. EMS reports 98% on room air. Pt was afebrile upon ED arrival, 94% on room air. Pt is alert and oriented x4 at present time.    Allergies No Known Allergies  Level of Care/Admitting Diagnosis ED Disposition    ED Disposition Condition Comment   Admit  Hospital Area: [place] [[id number]]  Level of Care: Telemetry Medical [104]  Covid Evaluation: N/A  Diagnosis: Pneumonia [308657]  Admitting Physician: Aline Brochure  Attending Physician: Larey Dresser A [[id number]  PT Class (Do Not Modify): Observation [104]  PT Acc Code (Do Not Modify): Observation [10022]       B Medical/Surgery History Past Medical History:  Diagnosis Date  . COPD (chronic obstructive pulmonary disease) ([place])   . Pneumothorax    2016, fell from ladder   Past Surgical History:  Procedure Laterality Date  . VIDEO ASSISTED THORACOSCOPY (VATS)/THOROCOTOMY Left 06/11/2013   Procedure: VIDEO ASSISTED THORACOSCOPY (VATS)/THOROCOTOMY;  Surgeon: Ivin Poot, MD;  Location: [facility];  Service: Thoracic;  Laterality: Left;  [name] VIDEO BRONCHOSCOPY N/A 06/11/2013   Procedure: VIDEO BRONCHOSCOPY;  Surgeon: Ivin Poot, MD;  Location: [facility] OR;  Service: Thoracic;  Laterality: N/A;     A IV Location/Drains/Wounds Patient Lines/Drains/Airways Status   Active Line/Drains/Airways    Name:   Placement date:   Placement time:   Site:   Days:   Peripheral IV  05/17/18 Right Antecubital   05/17/18    0904    Antecubital   less than 1   Peripheral IV 05/17/18 Left Antecubital   05/17/18    1003    Antecubital   less than 1   External Urinary Catheter   06/15/13    0720    -   1797   Incision (Closed) 06/11/13 Chest Left   06/11/13    0812     1801   Wound / Incision (Open or Dehisced) 06/22/13  Chest Right;Anterior 3 small red pustules   06/22/13    1813    Chest   1790          Intake/Output Last 24 hours  Intake/Output Summary (Last 24 hours) at 05/17/2018 1137 Last data filed at 05/17/2018 1125 Gross per 24 hour  Intake 650 ml  Output -  Net 650 ml    Labs/Imaging Results for orders placed or performed during the hospital encounter of 05/17/18 (from the past 48 hour(s))  CBC with Differential/Platelet     Status: Abnormal   Collection Time: 05/17/18  9:02 AM  Result Value Ref Range   WBC 17.4 (H) 4.0 - 10.5 K/uL   RBC 4.72 4.22 - 5.81 MIL/uL   Hemoglobin 15.7 13.0 - 17.0 g/dL   HCT 46.9 39.0 - 52.0 %   MCV 99.4 80.0 - 100.0 fL   MCH 33.3 26.0 - 34.0 pg   MCHC 33.5 30.0 - 36.0 g/dL   RDW 12.6 11.5 - 15.5 %  Platelets 239 150 - 400 K/uL   nRBC 0.0 0.0 - 0.2 %   Neutrophils Relative % 88 %   Neutro Abs 15.4 (H) 1.7 - 7.7 K/uL   Lymphocytes Relative 6 %   Lymphs Abs 1.0 0.7 - 4.0 K/uL   Monocytes Relative 5 %   Monocytes Absolute 0.8 0.1 - 1.0 K/uL   Eosinophils Relative 1 %   Eosinophils Absolute 0.2 0.0 - 0.5 K/uL   Basophils Relative 0 %   Basophils Absolute 0.1 0.0 - 0.1 K/uL   Immature Granulocytes 0 %   Abs Immature Granulocytes 0.05 0.00 - 0.07 K/uL    Comment: Performed at [facility] [address], [place], [place] [address]  Comprehensive metabolic panel     Status: Abnormal   Collection Time: 05/17/18  9:02 AM  Result Value Ref Range   Sodium 138 135 - 145 mmol/L   Potassium 4.0 3.5 - 5.1 mmol/L   Chloride 104 98 - 111 mmol/L   CO2 22 22 - 32 mmol/L   Glucose, Bld 127 (H) 70 - 99 mg/dL   BUN 14 6 -  20 mg/dL   Creatinine, Ser 1.03 0.61 - 1.24 mg/dL   Calcium 9.0 8.9 - 10.3 mg/dL   Total Protein 6.0 (L) 6.5 - 8.1 g/dL   Albumin 3.8 3.5 - 5.0 g/dL   AST 32 15 - 41 U/L   ALT 34 0 - 44 U/L   Alkaline Phosphatase 36 (L) 38 - 126 U/L   Total Bilirubin 0.5 0.3 - 1.2 mg/dL   GFR calc non Af Amer >60 >60 mL/min   GFR calc Af Amer >60 >60 mL/min   Anion gap 12 5 - 15    Comment: Performed at [facility] [address] [place], [place] [address]  D-dimer, quantitative     Status: None   Collection Time: 05/17/18  9:02 AM  Result Value Ref Range   D-Dimer, Quant <0.27 0.00 - 0.50 ug/mL-FEU    Comment: (NOTE) At the manufacturer cut-off of 0.50 ug/mL FEU, this assay has been documented to exclude PE with a sensitivity and negative predictive value of 97 to 99%.  At this time, this assay has not been approved by the FDA to exclude DVT/VTE. Results should be correlated with clinical presentation. Performed at [facility], [place] [address] [place], [place] [address]   Procalcitonin     Status: None   Collection Time: 05/17/18  9:02 AM  Result Value Ref Range   Procalcitonin <0.10 ng/mL    Comment:        Interpretation: PCT (Procalcitonin) <= 0.5 ng/mL: Systemic infection (sepsis) is not likely. Local bacterial infection is possible. (NOTE)       Sepsis PCT Algorithm           Lower Respiratory Tract                                      Infection PCT Algorithm    ----------------------------     ----------------------------         PCT < 0.25 ng/mL                PCT < 0.10 ng/mL         Strongly encourage             Strongly discourage   discontinuation of antibiotics  initiation of antibiotics    ----------------------------     -----------------------------       PCT 0.25 - 0.50 ng/mL            PCT 0.10 - 0.25 ng/mL               OR       >80% decrease in PCT            Discourage initiation of                                            antibiotics       Encourage discontinuation           of antibiotics    ----------------------------     -----------------------------         PCT >= 0.50 ng/mL              PCT 0.26 - 0.50 ng/mL               AND        <80% decrease in PCT             Encourage initiation of                                             antibiotics       Encourage continuation           of antibiotics    ----------------------------     -----------------------------        PCT >= 0.50 ng/mL                  PCT > 0.50 ng/mL               AND         increase in PCT                  Strongly encourage                                      initiation of antibiotics    Strongly encourage escalation           of antibiotics                                     -----------------------------                                           PCT <= 0.25 ng/mL                                                 OR                                        >  80% decrease in PCT                                     Discontinue / Do not initiate                                             antibiotics Performed at [facility], [place] [address] [place], [place] [address]   Lactate dehydrogenase     Status: Abnormal   Collection Time: 05/17/18  9:02 AM  Result Value Ref Range   LDH 242 (H) 98 - 192 U/L    Comment: Performed at [facility], [place] [address] [place], [place] [address]  Ferritin     Status: None   Collection Time: 05/17/18  9:02 AM  Result Value Ref Range   Ferritin 67 24 - 336 ng/mL    Comment: Performed at [facility], [place] [address], [place], [place] [address]  Triglycerides     Status: None   Collection Time: 05/17/18  9:02 AM  Result Value Ref Range   Triglycerides 52 <150 mg/dL    Comment: Performed at [facility] [address], [place], [place] [address]  Fibrinogen     Status: None   Collection Time: 05/17/18  9:02 AM  Result Value Ref Range   Fibrinogen 277 210 - 475 mg/dL    Comment:  Performed at [facility] [address], [place], [place] [address]  C-reactive protein     Status: None   Collection Time: 05/17/18  9:02 AM  Result Value Ref Range   CRP <0.8 <1.0 mg/dL    Comment: Performed at [facility], [place] [address] [place], [place] [address]  Troponin I - Once     Status: None   Collection Time: 05/17/18  9:02 AM  Result Value Ref Range   Troponin I <0.03 <0.03 ng/mL    Comment: Performed at [facility] [address] [place], [place] [address]  Brain natriuretic peptide     Status: None   Collection Time: 05/17/18  9:02 AM  Result Value Ref Range   B Natriuretic Peptide 31.4 0.0 - 100.0 pg/mL    Comment: Performed at [facility] [address] [place],  [address]  SARS Coronavirus 2 [facility] order, Performed in [facility] hospital lab)     Status: None   Collection Time: 05/17/18  9:53 AM  Result Value Ref Range   SARS Coronavirus 2 NEGATIVE NEGATIVE    Comment: (NOTE) If result is NEGATIVE SARS-CoV-2 target nucleic acids are NOT DETECTED. The SARS-CoV-2 RNA is generally detectable in upper and lower  respiratory specimens during the acute phase of infection. The lowest  concentration of SARS-CoV-2 viral copies this assay can detect is 250  copies / mL. A negative result does not preclude SARS-CoV-2 infection  and should not be used as the sole basis for treatment or other  patient management decisions.  A negative result may occur with  improper specimen collection / handling, submission of specimen other  than nasopharyngeal swab, presence of viral mutation(s) within the  areas targeted by this assay, and inadequate number of viral copies  (<250 copies / mL). A negative result must be combined with  clinical  observations, patient history, and epidemiological information. If result is POSITIVE SARS-CoV-2 target nucleic acids are DETECTED. The SARS-CoV-2 RNA is generally detectable in upper and lower   respiratory specimens dur ing the acute phase of infection.  Positive  results are indicative of active infection with SARS-CoV-2.  Clinical  correlation with patient history and other diagnostic information is  necessary to determine patient infection status.  Positive results do  not rule out bacterial infection or co-infection with other viruses. If result is PRESUMPTIVE POSTIVE SARS-CoV-2 nucleic acids MAY BE PRESENT.   A presumptive positive result was obtained on the submitted specimen  and confirmed on repeat testing.  While 2019 novel coronavirus  (SARS-CoV-2) nucleic acids may be present in the submitted sample  additional confirmatory testing may be necessary for epidemiological  and / or clinical management purposes  to differentiate between  SARS-CoV-2 and other Sarbecovirus currently known to infect humans.  If clinically indicated additional testing with an alternate test  methodology [phone]) is advised. The SARS-CoV-2 RNA is generally  detectable in upper and lower respiratory sp ecimens during the acute  phase of infection. The expected result is Negative. Fact Sheet for Patients:  StrictlyIdeas.no Fact Sheet for Healthcare Providers: BankingDealers.co.za This test is not yet approved or cleared by the [place] FDA and has been authorized for detection and/or diagnosis of SARS-CoV-2 by FDA under an Emergency Use Authorization (EUA).  This EUA will remain in effect (meaning this test can be used) for the duration of the COVID-19 declaration under Section 564(b)(1) of the Act, 21 U.S.C. section 360bbb-3(b)(1), unless the authorization is terminated or revoked sooner. Performed at [facility], [place] [address], [place], [place] [address]   Lactic acid, plasma     Status: Abnormal   Collection Time: 05/17/18 10:02 AM  Result Value Ref Range   Lactic Acid, Venous 2.2 (HH) 0.5 - 1.9 mmol/L    Comment: CRITICAL  RESULT CALLED TO, READ BACK BY AND VERIFIED WITH: Blanchie Serve RN [phone] BY A BENNETT Performed at [facility], [place] [address], [place], [place] [address]   Lactic acid, plasma     Status: None   Collection Time: 05/17/18 10:15 AM  Result Value Ref Range   Lactic Acid, Venous 1.7 0.5 - 1.9 mmol/L    Comment: Performed at [facility] [address], [place], [place] [address]   Dg Chest Port 1 View  Result Date: 05/17/2018 CLINICAL DATA:  Shortness of breath and cough EXAM: PORTABLE CHEST 1 VIEW COMPARISON:  March 29, 2018 FINDINGS: There is patchy airspace opacity in the right base. There is blunting of the left costophrenic angle, stable, likely due to scarring. Minimal left pleural effusion is possible in this area. Lungs elsewhere clear. Heart size and pulmonary vascularity normal. No adenopathy. There old healed rib fractures bilaterally. IMPRESSION: Patchy airspace opacity consistent with pneumonia in the right base. Probable scarring lateral left base, although minimal chronic left pleural effusion is possible. Stable cardiac silhouette. No evident adenopathy. Electronically Signed   By: Lowella Grip III M.D.   On: 05/17/2018 09:33    Pending Labs Unresulted Labs (From admission, onward)    Start     Ordered   05/17/18 0853  Culture, blood (routine x 2)  BLOOD CULTURE X 2,   STAT     05/17/18 0852          Vitals/Pain Today's Vitals   05/17/18 0954 05/17/18 1000 05/17/18 1030  05/17/18 1100  BP: (!) 151/93 (!) 158/96 (!) 141/83 127/70  Pulse: (!) 109 (!) 113 (!) 121 (!) 114  Resp: (!) 25 (!) 28 (!) 21 (!) 25  Temp:      TempSrc:      SpO2: 95% 94% 93% 92%  Weight:      Height:      PainSc:        Isolation Precautions No active isolations  Medications Medications  azithromycin (ZITHROMAX) 500 mg in sodium chloride 0.9 % 250 mL IVPB (500 mg Intravenous New Bag/Given 05/17/18 1055)  magnesium sulfate IVPB 2 g 50 mL (0 g Intravenous Stopped 05/17/18  1055)  methylPREDNISolone sodium succinate (SOLU-MEDROL) 125 mg/2 mL injection 125 mg (125 mg Intravenous Given 05/17/18 0929)  sodium chloride 0.9 % bolus 500 mL (0 mLs Intravenous Stopped 05/17/18 1125)  albuterol (VENTOLIN HFA) 108 (90 Base) MCG/ACT inhaler 8 puff (8 puffs Inhalation Given 05/17/18 0942)  cefTRIAXone (ROCEPHIN) 1 g in sodium chloride 0.9 % 100 mL IVPB (0 g Intravenous Stopped 05/17/18 1124)    Mobility walks Low fall risk   Focused Assessments Pulmonary Assessment Handoff:  Lung sounds:   O2 Device: Nasal Cannula O2 Flow Rate (L/min): 2 L/min      R Recommendations: See Admitting Provider Note  Report given to:   Additional Notes:

## 2018-05-18 DIAGNOSIS — J181 Lobar pneumonia, unspecified organism: Secondary | ICD-10-CM

## 2018-05-18 DIAGNOSIS — F1010 Alcohol abuse, uncomplicated: Secondary | ICD-10-CM

## 2018-05-18 DIAGNOSIS — I10 Essential (primary) hypertension: Secondary | ICD-10-CM

## 2018-05-18 DIAGNOSIS — Z902 Acquired absence of lung [part of]: Secondary | ICD-10-CM

## 2018-05-18 DIAGNOSIS — Z7952 Long term (current) use of systemic steroids: Secondary | ICD-10-CM

## 2018-05-18 DIAGNOSIS — Z79899 Other long term (current) drug therapy: Secondary | ICD-10-CM

## 2018-05-18 DIAGNOSIS — J441 Chronic obstructive pulmonary disease with (acute) exacerbation: Secondary | ICD-10-CM

## 2018-05-18 LAB — CBC
HCT: 42.9 % (ref 39.0–52.0)
Hemoglobin: 14.2 g/dL (ref 13.0–17.0)
MCH: 32.7 pg (ref 26.0–34.0)
MCHC: 33.1 g/dL (ref 30.0–36.0)
MCV: 98.8 fL (ref 80.0–100.0)
Platelets: 227 10*3/uL (ref 150–400)
RBC: 4.34 MIL/uL (ref 4.22–5.81)
RDW: 12.6 % (ref 11.5–15.5)
WBC: 18.4 10*3/uL — ABNORMAL HIGH (ref 4.0–10.5)
nRBC: 0 % (ref 0.0–0.2)

## 2018-05-18 LAB — BASIC METABOLIC PANEL
Anion gap: 9 (ref 5–15)
BUN: 14 mg/dL (ref 6–20)
CO2: 25 mmol/L (ref 22–32)
Calcium: 9 mg/dL (ref 8.9–10.3)
Chloride: 104 mmol/L (ref 98–111)
Creatinine, Ser: 0.79 mg/dL (ref 0.61–1.24)
GFR calc Af Amer: >60 mL/min (ref >60)
GFR calc non Af Amer: >60 mL/min (ref >60)
Glucose, Bld: 119 mg/dL — ABNORMAL HIGH (ref 70–99)
Potassium: 3.9 mmol/L (ref 3.5–5.1)
Sodium: 138 mmol/L (ref 135–145)

## 2018-05-18 MED ORDER — IPRATROPIUM-ALBUTEROL 0.5-2.5 (3) MG/3ML IN SOLN: 3.0000 mL | Freq: Four times a day (QID) | RESPIRATORY_TRACT | Status: DC | PRN

## 2018-05-18 MED ORDER — AMOXICILLIN-POT CLAVULANATE 875-125 MG PO TABS: 1.0000 | ORAL_TABLET | Freq: Two times a day (BID) | ORAL | Status: DC

## 2018-05-18 MED ORDER — PREDNISONE 20 MG PO TABS: ORAL_TABLET | ORAL | 0 refills | Status: DC

## 2018-05-18 MED ORDER — AMOXICILLIN-POT CLAVULANATE 875-125 MG PO TABS: 1.0000 | ORAL_TABLET | Freq: Two times a day (BID) | ORAL | 0 refills | Status: DC

## 2018-05-18 NOTE — Discharge Summary (Signed)
Name: James [name] MRN: [id number] DOB: 10-Apr-1960 58 y.o. PCP: Scot Jun, FNP  Date of Admission: 05/17/2018  8:42 AM Date of Discharge: 05/18/2018 Attending Physician: Larey Dresser, MD  Discharge Diagnosis: 1. COPD Exacerbation  2. Right Middle Lobe Pneumonia  3. Alcohol Use Disorder 4. Hypertension   Discharge Medications: Allergies as of 05/18/2018   No Known Allergies     Medication List    TAKE these medications   albuterol (2.5 MG/3ML) 0.083% nebulizer solution Commonly known as:  PROVENTIL Take 3 mLs (2.5 mg total) by nebulization every 6 (six) hours as needed for wheezing or shortness of breath.   albuterol 108 (90 Base) MCG/ACT inhaler Commonly known as:  VENTOLIN HFA Inhale 1-2 puffs into the lungs every 6 (six) hours as needed for wheezing or shortness of breath.   amoxicillin-clavulanate 875-125 MG tablet Commonly known as:  AUGMENTIN Take 1 tablet by mouth every 12 (twelve) hours.   benzonatate 100 MG capsule Commonly known as:  TESSALON Take 1-2 capsules (100-200 mg total) by mouth 3 (three) times daily as needed for cough.   Flutter Devi Use 4 times daily   folic acid 1 MG tablet Commonly known as:  FOLVITE Take 1 tablet (1 mg total) by mouth daily.   losartan 100 MG tablet Commonly known as:  Cozaar Take 1 tablet (100 mg total) by mouth daily.   mometasone-formoterol 200-5 MCG/ACT Aero Commonly known as:  DULERA Inhale 2 puffs into the lungs 2 (two) times daily.   predniSONE 20 MG tablet Commonly known as:  DELTASONE Take 2 tablets (40 mg total) by mouth daily with breakfast for 3 days, THEN 1 tablet (20 mg total) daily with breakfast for 4 days, THEN 0.5 tablets (10 mg total) daily with breakfast for 4 days. Start taking on:  May 19, 2018 What changed:    medication strength  See the new instructions.   thiamine 100 MG tablet Take 1 tablet (100 mg total) by mouth daily.   umeclidinium bromide 62.5 MCG/INH Aepb  Commonly known as:  Incruse Ellipta Inhale 1 puff into the lungs daily.       Disposition and follow-up:   [name] was discharged from [facility] in Stable condition.  At the hospital follow up visit please address:  1.  COPD Exacerbation: Symptoms improved with nebulizer treatments. Recently established with pulmonology. Was placed on steroid taper but apparently was taking 20mg  prednisone daily instead of tapering. Taper was restarted with close instructions. Continued on home inhalers. Ambulated well on RA prior to discharge.  2. Right Middle Lobe Pneumonia: Leukocytosis partially secondary to being on prednisone already, started on ceftriaxone and azithro and transitioned to amoxicillin-clavulanate at discharge due to some concern for aspiration with alcohol use. Will complete 7 days abx total.   3. Alcohol Use Disorder: drinks 6pk of beer per day. He is interested in quitting and will follow-up with PCP to discuss options further. May benefit from naltrexone therapy. Denies having withdrawal previously.   2.  Labs / imaging needed at time of follow-up: CBC  3.  Pending labs/ test needing follow-up: none  Follow-up Appointments:   Hospital Course by problem list: 1. COPD Exacerbation  2. Right Middle Lobe Pneumonia  3. Alcohol Use Disorder 4. Hypertension   James [name] is a 58 yo M with a pmhx of HTN, COPD on chronic prednisone, and alcohol use disorder presenting with 1 day of cough and SOB. Chest x-ray showed showed right  middle lobe pneumonia. He was admitted for treatment of his pneumonia and was started on ceftriaxone and azithromycin. With his ongoing alcohol use there was some concern for possible aspiration. He was also started on prednisone 40 mg qd. He recently established with a pulmonologist, [name] for his COPD. No PFTs currently on file. He had been placed on prednisone taper for exacerbation but appeared to be taking the prednisone 20 mg  qd consistently. Leukocytosis was thought to be secondary to prednisone. Blood cultures were negative. Symptoms improved with nebulizer treatments and antibiotic therapy. He was transitioned to Augmentin 875 mg bid for six more days to complete seven days total of antibiotic therapy. His steroid taper was restarted with education provided. He stated that his medications were usually managed by his sister. She was called several times but could not be reached. Prior to discharge he ambulated well on room air. He was discharged with recommended close follow-up with his PCP and [name] and continuation of his other medications for his COPD.   Alcohol Use Disorder Drinks one 6pk per day. Denied ever having gone through withdrawal. He was started on CIWA and did not require ativan. He is interested in quitting or cutting back and may benefit from naltrexone therapy.   Discharge Vitals:   BP 140/89 (BP Location: Right Arm)   Pulse 82   Temp 98.6 F (37 C) (Oral)   Resp 18   Ht 5\' 9"  (1.753 m)   Wt 85 kg   SpO2 96%   BMI 27.67 kg/m   Pertinent Labs, Studies, and Procedures:   CXR 05/17/18 IMPRESSION: Patchy airspace opacity consistent with pneumonia in the right base. Probable scarring lateral left base, although minimal chronic left pleural effusion is possible. Stable cardiac silhouette. No evident adenopathy. Electronically Signed   By: Lowella Grip III M.D.   On: 05/17/2018 09:33   Discharge Instructions: Discharge Instructions    Diet - low sodium heart healthy   Complete by:  As directed    Discharge instructions   Complete by:  As directed    You were hospitalized for Pneumonia and exacerbation of your COPD. Thank you for allowing [place] to be part of your care.   Please follow-up with your primary care physician and pulmonologist within the next week.   Please note these changes made to your medications:   Prednisone - you were recently prescribed a prednisone taper by  [name], where you start at a higher dose and decrease slowly. This should now be complete. If you have not completed the taper, you can STOP YOUR OLD PREDNISONE DOSE as we are starting your on a short course of higher dose steroids and will start you on a new taper.   Please take prednisone (DELTASONE) 20 mg tablet in the following order: Please take TWO tablets per day for the next three days THEN Take ONE tablet per day for four days THEN Take one HALF of a tablet per day for four days.  This medication taper should take 11 day to complete.   Amoxicillin-clavulanate (AUGMENTIN) 875-125 MG tablet - take one tablet every 12 hours for six days starting this evening  Azithromycin   Increase activity slowly   Complete by:  As directed       Signed: [name] A, DO 05/22/2018, 10:51 AM   Pager: [phone]

## 2018-05-18 NOTE — Evaluation (Signed)
Physical Therapy Evaluation Patient Details Name: James [name] MRN: [id number] DOB: January 01, 1961 Today's Date: 05/18/2018   History of Present Illness  Patient is a 58 yo M with pmhx of HTN, COPD, and alcohol use disorder presenting today with acute onset cough and shortness of breath. Patient says he was in his normal state of health until 3 am this morning when he woke from sleep with sudden difficulty breathing and cough. He denies chest pain, heart palpitations, and lower extremity swelling. No fevers, chills, or sick contacts     Clinical Impression  Patient evaluated by Physical Therapy with no further acute PT needs identified. All education has been completed and the patient has no further questions. PTA pt independent. Today ambulating unit without assistance on RA, SpO2 remaining in mid 90s, no SOB, occasional cough. Pt with mild tremor he reports happens with breathing treatment. mild unsteadiness but no overt LOB and no hx of fall. See below for any follow-up Physical Therapy or equipment needs. PT is signing off. Thank you for this referral.     Follow Up Recommendations No PT follow up    Equipment Recommendations  None recommended by PT    Recommendations for Other Services       Precautions / Restrictions        Mobility  Bed Mobility Overal bed mobility: Independent                Transfers Overall transfer level: Independent                  Ambulation/Gait Ambulation/Gait assistance: Independent Gait Distance (Feet): 500 Feet Assistive device: None Gait Pattern/deviations: WFL(Within Functional Limits) Gait velocity: normal   General Gait Details: satting above 94% on RA for 6 minute walk. mild tremor he reports from his breathing treatments  Stairs            Wheelchair Mobility    Modified Rankin (Stroke Patients Only)       Balance Overall balance assessment: Mild deficits observed, not formally tested                                            Pertinent Vitals/Pain      Home Living Family/patient expects to be discharged to:: Private residence Living Arrangements: Alone Available Help at Discharge: Family;Friend(s);Available PRN/intermittently Type of Home: House Home Access: Stairs to enter Entrance Stairs-Rails: None Entrance Stairs-Number of Steps: 2 Home Layout: One level Home Equipment: None      Prior Function Level of Independence: Independent         Comments: works as Psychologist, educational   Dominant Hand: Right    Extremity/Trunk Assessment   Upper Extremity Assessment Upper Extremity Assessment: Overall WFL for tasks assessed    Lower Extremity Assessment Lower Extremity Assessment: Overall WFL for tasks assessed    Cervical / Trunk Assessment Cervical / Trunk Assessment: Normal  Communication   Communication: No difficulties  Cognition Arousal/Alertness: Awake/alert                                            General Comments      Exercises     Assessment/Plan    PT Assessment Patent does not need any further  PT services  PT Problem List         PT Treatment Interventions      PT Goals (Current goals can be found in the Care Plan section)  Acute Rehab PT Goals Patient Stated Goal: go home when he can PT Goal Formulation: With patient    Frequency     Barriers to discharge        Co-evaluation               AM-PAC PT "6 Clicks" Mobility  Outcome Measure Help needed turning from your back to your side while in a flat bed without using bedrails?: None Help needed moving from lying on your back to sitting on the side of a flat bed without using bedrails?: None Help needed moving to and from a bed to a chair (including a wheelchair)?: None Help needed standing up from a chair using your arms (e.g., wheelchair or bedside chair)?: None Help needed to walk in hospital room?: None Help needed  climbing 3-5 steps with a railing? : None 6 Click Score: 24    End of Session   Activity Tolerance: Patient tolerated treatment well Patient left: in bed Nurse Communication: Mobility status PT Visit Diagnosis: Unsteadiness on feet (R26.81)    Time: 1100-1118 PT Time Calculation (min) (ACUTE ONLY): 18 min   Charges:   PT Evaluation $PT Eval Low Complexity: 1 Low          Reinaldo Berber, PT, DPT Acute Rehabilitation Services Pager: [phone] Office: [phone]    Reinaldo Berber 05/18/2018, 11:13 AM

## 2018-05-18 NOTE — TOC Initial Note (Signed)
Transition of Care [facility]) - Initial/Assessment Note    Patient Details  Name: James [name] MRN: [id number] Date of Birth: [birth year]  Transition of [facility]) CM/SW Contact:    Bartholomew Crews, RN Phone Number: [phone] 05/18/2018, 3:10 PM  Clinical Narrative:                 PTA home alone. Patient to transition home today. Noted scripts sent to outpatient pharmacy. Patient states that his sister will pick these up for him and that she will pick him up from the hospital. Patient is followed by NP at [facility] and has a pulmonologist who he states he will see soon. Discussed importance of taking medications as ordered. No other transition of care needs identified.   Expected Discharge Plan: Home/Self Care Barriers to Discharge: No Barriers Identified   Patient Goals and CMS Choice     Choice offered to / list presented to : NA  Expected Discharge Plan and Services Expected Discharge Plan: Home/Self Care In-house Referral: Financial Counselor Discharge Planning Services: CM Consult Post Acute Care Choice: NA   Expected Discharge Date: 05/18/18               DME Arranged: N/A DME Agency: NA       HH Arranged: NA HH Agency: NA        Prior Living Arrangements/Services   Lives with:: Self Patient language and need for interpreter reviewed:: Yes              Criminal Activity/Legal Involvement Pertinent to Current Situation/Hospitalization: No - Comment as needed  Activities of Daily Living Home Assistive Devices/Equipment: None ADL Screening (condition at time of admission) Patient's cognitive ability adequate to safely complete daily activities?: Yes Is the patient deaf or have difficulty hearing?: No Does the patient have difficulty seeing, even when wearing glasses/contacts?: No Does the patient have difficulty concentrating, remembering, or making decisions?: No Patient able to express need for assistance with ADLs?: Yes Does the patient have  difficulty dressing or bathing?: No Independently performs ADLs?: Yes (appropriate for developmental age) Does the patient have difficulty walking or climbing stairs?: No Weakness of Legs: None Weakness of Arms/Hands: None  Permission Sought/Granted                  Emotional Assessment Appearance:: Appears older than stated age Attitude/Demeanor/Rapport: Engaged Affect (typically observed): Accepting Orientation: : Oriented to Self, Oriented to  Time, Oriented to Place, Oriented to Situation      Admission diagnosis:  COPD exacerbation ([place]) [J44.1] Pneumonia of right lower lobe due to infectious organism [facility]) [J18.1] Patient Active Problem List   Diagnosis Date Noted  . Pneumonia 05/17/2018  . COPD exacerbation ([place]) 02/16/2018  . Hypertension 05/17/2016  . Low back pain radiating to both legs 04/18/2016  . History of tobacco abuse 04/06/2016  . Alcohol abuse 04/06/2016  . Elevated liver enzymes 04/06/2016  . COPD (chronic obstructive pulmonary disease) ([place]) 03/26/2016  . Acute on chronic respiratory failure with hypoxia (H[facility]) 06/08/2013   PCP:  Scot Jun, FNP Pharmacy:   [id number] - [place], [place] - [address] [place] [address] [place] [address] Phone: [phone] Fax: [phone]  [place], [place] - [address] [place] [address] Phone: [phone] Fax: [place], [address] [place]  [place] [address] Phone: [phone] Fax: [phone]     Social Determinants of Health (SDOH) Interventions    Readmission Risk Interventions No flowsheet data found.

## 2018-05-18 NOTE — Progress Notes (Signed)
SATURATION QUALIFICATIONS: (This note is used to comply with regulatory documentation for home oxygen)  Patient Saturations on Room Air at Rest = 99%  Patient Saturations on Room Air while Ambulating = 97%  Patient Saturations on   N/a  Liters of oxygen while Ambulating =  N/a %  Please briefly explain why patient needs home oxygen:

## 2018-05-18 NOTE — Progress Notes (Signed)
DISCHARGE NOTE HOME GAYLE COLLARD to be discharged to home per MD order. Discussed prescriptions and follow up appointments with the patient. Prescriptions given to patient; medication list explained in detail. Patient verbalized understanding.  Skin clean, dry and intact without evidence of skin break down, no evidence of skin tears noted. IV catheter discontinued intact. Site without signs and symptoms of complications. Dressing and pressure applied. Pt denies pain at the site currently. No complaints noted.  Patient free of lines, drains, and wounds.   An After Visit Summary (AVS) was printed and given to the patient. Patient escorted via wheelchair, and discharged home via private auto.  [place], Zenon Mayo, RN
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
## 2018-05-18 NOTE — Progress Notes (Addendum)
   Subjective: His breathing is much better now, no SOB. He endorses [name] cough productive of yellow sputum. 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 He recently got referred to pulmonology with [name]. and had his first telehealth visit recently. His PCP started him on prednisone qd about [name] month ago and this has really helped his breathing. Discussed possible discharge if he is able to ambulate with good oxygenation. He states he is somewhat shaky because of just having had his albuterol nebulizer.   Objective:  Vital signs in last 24 hours: Vitals:   05/17/18 2039 05/17/18 2050 05/17/18 2054 05/18/18 0446  BP: (!) 146/90   (!) 142/96  Pulse: 90   75  Resp: (!) 22   (!) 21  Temp: 98.4 F (36.9 C)   97.7 F (36.5 C)  TempSrc: Oral   Oral  SpO2: 99% 98% 98% 99%  Weight: 85 kg     Height:       Constitution: tremulous, NAD, sitting up in bed Cardio: tachycardic, no m/r/g  Respiratory: good air movement, mild wheezing on left, otherwise CTA Abdominal: non-distended, soft, NTTP, +BS MSK: no LE edema Neuro: [name]&o to time place and self  Skin: c/d/i    Assessment/Plan:  Principal Problem:   COPD exacerbation (HCC) Active Problems:   Pneumonia  James [name] is [name] 58 yo M with [name] pmhx of HTN, COPD on chronic prednisone, and alcohol use disorder presenting with 1 day of cough and SOB. Found to have [name] right middle lobe pneumonia.  Acute on Chronic COPD Exacerbation Right Middle Lobe Pneumonia Symptoms significantly improved today with nebulizers, increased prednisone and antibiotics. Breathing on room air during rounds and good air movement on exam. He is on prednisone chronically, dosage unclear and unable to see notes, and is establishing care with [name] with pulmonology.   - ambulate with O2 sats - will be stable for discharge if sats <88% while ambulating  - switch to augmentin 875 mg bid for seven  days antibiotic therapy total - continue prednisone 40 mg for five days then continue home dose prednisone  - cont. duonebs q6h  - continue dulera BID, will continue incruse at discharge  Alcohol Use Disorder CIWA 9 this am. Received ativan once last night and this morning. On rounds he was somewhat tremulous and tachycardic but he states he had just received his albuterol nebulizer treatment. He reiterated that he has never withdrawn although he drinks [name] 6 pack of beer every night.   - cont. CIWA with ativan  - thiamine, folate, multivitamin   Hypertension On losartan at home. Will resume.   VTE: lovenox  IVF: none  Diet: HH Code: full   Dispo: Anticipated discharge pending ambulation with O2 saturations.   James Hazard A, DO 05/18/2018, 6:17 AM Pager: [phone]

## 2018-05-22 LAB — CULTURE, BLOOD (ROUTINE X 2)
Culture: NO GROWTH
Culture: NO GROWTH
Special Requests: ADEQUATE

## 2018-05-25 ENCOUNTER — Ambulatory Visit (INDEPENDENT_AMBULATORY_CARE_PROVIDER_SITE_OTHER): Payer: Self-pay | Admitting: Internal Medicine

## 2018-05-25 ENCOUNTER — Other Ambulatory Visit: Payer: Self-pay

## 2018-05-25 ENCOUNTER — Encounter: Payer: Self-pay | Admitting: Internal Medicine

## 2018-05-25 VITALS — BP 126/74 | HR 77 | Temp 98.6°F | Ht 67.0 in | Wt 194.2 lb

## 2018-05-25 DIAGNOSIS — J181 Lobar pneumonia, unspecified organism: Secondary | ICD-10-CM

## 2018-05-25 DIAGNOSIS — J449 Chronic obstructive pulmonary disease, unspecified: Secondary | ICD-10-CM

## 2018-05-25 DIAGNOSIS — J189 Pneumonia, unspecified organism: Secondary | ICD-10-CM

## 2018-05-25 MED ORDER — UMECLIDINIUM BROMIDE 62.5 MCG/INH IN AEPB: 1.0000 | INHALATION_SPRAY | Freq: Every day | RESPIRATORY_TRACT | 0 refills | Status: DC

## 2018-05-25 MED ORDER — MOMETASONE FURO-FORMOTEROL FUM 200-5 MCG/ACT IN AERO: 2.0000 | INHALATION_SPRAY | Freq: Two times a day (BID) | RESPIRATORY_TRACT | 0 refills | Status: DC

## 2018-05-25 NOTE — Patient Instructions (Addendum)
Plan A = Automatic = Dulera 2 pffs and chase with Incruse one click then repeat dulera 12 hours x 2 hours   Work on inhaler technique:  relax and gently blow all the way out then take a nice smooth deep breath back in, triggering the inhaler at same time you start breathing in.  Hold for up to 5 seconds if you can. Blow out thru nose. Rinse and gargle with water when done   Plan B = Backup Only use your albuterol(Ventolin)  inhaler as a rescue medication to be used if you can't catch your breath by resting or doing a relaxed purse lip breathing pattern.  - The less you use it, the better it will work when you need it. - Ok to use the inhaler up to 2 puffs  every 4 hours if you must but call for appointment if use goes up over your usual need - Don't leave home without it !!  (think of it like the spare tire for your car)   Plan C = Crisis - only use your albuterol nebulizer if you first try Plan B and it fails to help > ok to use the nebulizer up to every 4 hours but if start needing it regularly call for immediate appointment  Please remember to go to the lab department   for your tests - we will call you with the results when they are available.      Please schedule a follow up visit in 3 months but call sooner if needed  with all medications /inhalers/ solutions in hand so we can verify exactly what you are taking. This includes all medications from all doctors and over the counters  - PFT s on return if restrictions are lifted then   Add :  Did not realize he was planning on f/u with [name] > will refer him back and see here prn

## 2018-05-25 NOTE — Progress Notes (Signed)
James [name], male    DOB: [birth year],     MRN: [id number]   Brief patient profile:  60  yowm quit smoking 2015/ worked as tree/landscaping  with onset doe  around 2010 and worse since quit but did not use inhalers until 2019 > a little better after started but still having freq aecopd = 3 since 07/2017 last one:   Date of Admission: 05/17/2018    Date of Discharge: 05/18/2018    Discharge Diagnosis: 1. COPD Exacerbation  2. Right Middle Lobe Pneumonia  3. Alcohol Use Disorder 4. Hypertension   Discharge Medications: Allergies as of 05/18/2018   No Known Allergies        Medication List    TAKE these medications   albuterol (2.5 MG/3ML) 0.083% nebulizer solution Commonly known as:  PROVENTIL Take 3 mLs (2.5 mg total) by nebulization every 6 (six) hours as needed for wheezing or shortness of breath.   albuterol 108 (90 Base) MCG/ACT inhaler Commonly known as:  VENTOLIN HFA Inhale 1-2 puffs into the lungs every 6 (six) hours as needed for wheezing or shortness of breath.   amoxicillin-clavulanate 875-125 MG tablet Commonly known as:  AUGMENTIN Take 1 tablet by mouth every 12 (twelve) hours.   benzonatate 100 MG capsule Commonly known as:  TESSALON Take 1-2 capsules (100-200 mg total) by mouth 3 (three) times daily as needed for cough.   Flutter Devi Use 4 times daily   folic acid 1 MG tablet Commonly known as:  FOLVITE Take 1 tablet (1 mg total) by mouth daily.   losartan 100 MG tablet Commonly known as:  Cozaar Take 1 tablet (100 mg total) by mouth daily.   mometasone-formoterol 200-5 MCG/ACT Aero Commonly known as:  DULERA Inhale 2 puffs into the lungs 2 (two) times daily.   predniSONE 20 MG tablet Commonly known as:  DELTASONE Take 2 tablets (40 mg total) by mouth daily with breakfast for 3 days, THEN 1 tablet (20 mg total) daily with breakfast for 4 days, THEN 0.5 tablets (10 mg total) daily with breakfast for 4 days. Start taking on:  May 19, 2018 What changed:    medication strength  See the new instructions.   thiamine 100 MG tablet Take 1 tablet (100 mg total) by mouth daily.   umeclidinium bromide 62.5 MCG/INH Aepb Commonly known as:  Incruse Ellipta Inhale 1 puff into the lungs daily.       1.  COPD Exacerbation: Symptoms improved with nebulizer treatments. Recently established with pulmonology. Was placed on steroid taper but apparently was taking 20mg  prednisone daily instead of tapering. Taper was restarted with close instructions. Continued on home inhalers. Ambulated well on RA prior to discharge.  2. Right Middle Lobe Pneumonia: Leukocytosis partially secondary to being on prednisone already, started on ceftriaxone and azithro and transitioned to amoxicillin-clavulanate at discharge due to some concern for aspiration with alcohol use. Will complete 7 days abx total.   3. Alcohol Use Disorder: drinks 6pk of beer per day. He is interested in quitting and will follow-up with PCP to discuss options further. May benefit from naltrexone therapy. Denies having withdrawal previously.         History of Present Illness  05/25/2018  Pulmonary/ 1st office eval/Paxton Kanaan last day of prednisone 05/26/18  Chief Complaint  Patient presents with  . COPD    exacerbation per the ER  Dyspnea:  Best days = MMRC3 = can't walk 100 yards even at a slow pace at a  flat grade s stopping due to sob   Cough: all day long /not noct/still on augmentin / min mucoid now Sleep: able to lie flat now SABA use: confused with various instructions from outpt ([name]  ) and inpt   No obvious day to day or daytime variability or assoc excess/ purulent sputum or mucus plugs or hemoptysis or cp or chest tightness, subjective wheeze or overt sinus or hb symptoms.   sleeping without nocturnal  or early am exacerbation  of respiratory  c/o's or need for noct saba. Also denies any obvious fluctuation of  symptoms with weather or environmental changes or other aggravating or alleviating factors except as outlined above   No unusual exposure hx or h/o childhood pna/ asthma or knowledge of premature birth.  Current Allergies, Complete Past Medical History, Past Surgical History, Family History, and Social History were reviewed in [facility] record.  ROS  The following are not active complaints unless bolded Hoarseness, sore throat, dysphagia, dental problems, itching, sneezing,  nasal congestion or discharge of excess mucus or purulent secretions, ear ache,   fever, chills, sweats, unintended wt loss or wt gain, classically pleuritic or exertional cp,  orthopnea pnd or arm/hand swelling  or leg swelling, presyncope, palpitations, abdominal pain, anorexia, nausea, vomiting, diarrhea  or change in bowel habits or change in bladder habits, change in stools or change in urine, dysuria, hematuria,  rash, arthralgias, visual complaints, headache, numbness, weakness or ataxia or problems with walking or coordination,  change in mood or  memory.           Past Medical History:  Diagnosis Date  . COPD (chronic obstructive pulmonary disease) ([place])   . Pneumothorax    2016, fell from ladder    Outpatient Medications Prior to Visit  Medication Sig Dispense Refill  . albuterol (PROVENTIL HFA;VENTOLIN HFA) 108 (90 Base) MCG/ACT inhaler Inhale 1-2 puffs into the lungs every 6 (six) hours as needed for wheezing or shortness of breath. 1 Inhaler 5  . albuterol (PROVENTIL) (2.5 MG/3ML) 0.083% nebulizer solution Take 3 mLs (2.5 mg total) by nebulization every 6 (six) hours as needed for wheezing or shortness of breath. 150 mL 1  . amoxicillin-clavulanate (AUGMENTIN) 875-125 MG tablet Take 1 tablet by mouth every 12 (twelve) hours. 13 tablet 0  . benzonatate (TESSALON) 100 MG capsule Take 1-2 capsules (100-200 mg total) by mouth 3 (three) times daily as needed for cough. 60 capsule 3  .  folic acid (FOLVITE) 1 MG tablet Take 1 tablet (1 mg total) by mouth daily. 90 tablet 0  . losartan (COZAAR) 100 MG tablet Take 1 tablet (100 mg total) by mouth daily. 30 tablet 3  . mometasone-formoterol (DULERA) 200-5 MCG/ACT AERO Inhale 2 puffs into the lungs 2 (two) times daily. 1 Inhaler 5  . predniSONE (DELTASONE) 20 MG tablet Take 2 tablets (40 mg total) by mouth daily with breakfast for 3 days, THEN 1 tablet (20 mg total) daily with breakfast for 4 days, THEN 0.5 tablets (10 mg total) daily with breakfast for 4 days. 12 tablet 0  . Respiratory Therapy Supplies (FLUTTER) DEVI Use 4 times daily 1 each 0  . thiamine 100 MG tablet Take 1 tablet (100 mg total) by mouth daily. 90 tablet 0  .           Objective:     BP 126/74 (BP Location: Left Arm, Patient Position: Sitting, Cuff Size: Normal)   Pulse 77  Temp 98.6 F (37 C)   Ht 5\' 7"  (1.702 m)   Wt 194 lb 3.2 oz (88.1 kg)   SpO2 95%   BMI 30.42 kg/m   SpO2: 95 % RA   amb disheveled wm >> stated age    HEENT:  Edentulous / nl oropharynx. Nl external ear canals without cough reflex -  Mild bilateral non-specific turbinate edema     NECK :  without JVD/Nodes/TM/ nl carotid upstrokes bilaterally   LUNGS: no acc muscle use,  Mild/mod barrel  contour chest wall with bilateral  Distant bs s audible wheeze and  without cough on insp or exp maneuver and mod  Hyperresonant  to  percussion bilaterally     CV:  RRR  no s3 or murmur or increase in P2, and no edema   ABD:  soft and nontender with pos mid  insp Hoover's  in the supine position. No bruits or organomegaly appreciated, bowel sounds nl  MS:   Nl gait/  ext warm without deformities, calf tenderness, cyanosis or clubbing No obvious joint restrictions   SKIN: warm and dry without lesions    NEURO:  alert, approp, nl sensorium with  no motor or cerebellar deficits apparent.       I personally reviewed images and agree with radiology impression as follows:  pCXR:    05/17/2018 Patchy airspace opacity consistent with pneumonia in the right base. Probable scarring lateral left base, although minimal chronic left pleural effusion is possible. Stable cardiac silhouette. No evident Adenopathy.   Labs  reviewed:      Chemistry      Component Value Date/Time   NA 138 05/18/2018 0428   NA 141 03/14/2018 0938   K 3.9 05/18/2018 0428   CL 104 05/18/2018 0428   CO2 25 05/18/2018 0428   BUN 14 05/18/2018 0428   BUN 16 03/14/2018 0938   CREATININE 0.79 05/18/2018 0428   CREATININE 0.79 10/26/2016 1103      Component Value Date/Time   CALCIUM 9.0 05/18/2018 0428   ALKPHOS 36 (L) 05/17/2018 0902   AST 32 05/17/2018 0902   ALT 34 05/17/2018 0902   ALT 62 (H) 06/23/2016 1226   BILITOT 0.5 05/17/2018 0902   BILITOT 0.3 03/14/2018 0938        Lab Results  Component Value Date   WBC 18.4 (H) 05/18/2018   HGB 14.2 05/18/2018   HCT 42.9 05/18/2018   MCV 98.8 05/18/2018   PLT 227 05/18/2018     Lab Results  Component Value Date   DDIMER <0.27 05/17/2018      Lab Results  Component Value Date   TSH 0.925 03/14/2018              Assessment   COPD mixed type (HCC) Quit smoking 2015  - 3 admits in time span of one year, last one 05/17/2018 with aecopd  - 05/25/2018  After extensive coaching inhaler device,  effectiveness =    75% with hfa/ 90% with dpi but ? coughing from dpi - alpha one screen 05/25/2018 >>>    >>>> Clearly  Group D in terms of symptom/risk and laba/lama/ICS  therefore appropriate rx at this point >>>  Continue dulera/incruse for now - if cough persists consider change to spiriva smi from incruse      DDX of  difficult airways management almost all start with A and  include Adherence, Ace Inhibitors, Acid Reflux, Active Sinus Disease, Alpha 1 Antitripsin deficiency, Anxiety masquerading as Airways  dz,  ABPA,  Allergy(esp in young), Aspiration (esp in elderly), Adverse effects of meds,  Active smoking or vaping, A bunch  of PE's (a small clot burden can't cause this syndrome unless there is already severe underlying pulm or vascular dz with poor reserve) plus two Bs  = Bronchiectasis and Beta blocker use..and one C= CHF  Adherence is always the initial "prime suspect" and is a multilayered concern that requires a "trust but verify" approach in every patient - starting with knowing how to use medications, especially inhalers, correctly, keeping up with refills and understanding the fundamental difference between maintenance and prns vs those medications only taken for a very short course and then stopped and not refilled.  - see hfa/dpi teaching - [facility] [name] with all meds in hand using a trust but verify approach to confirm accurate Medication  Reconciliation The principal here is that until we are certain that the  patients are doing what we've asked, it makes no sense to ask them to do more.   ? Active smoking/ vaping > denies   ? Alpha one at Def > send labs   ? Allergy/asthma > cover with high dose ICS for now   ? Bronchiectasis component > would need hrct to exclude but would not change rx for now   ? chf > bnp nl at last aecopd makes this unlikely    >>> no need for f/u here as has no funds for meds so samples given today and directed back to [name] at [facility] with pfts planned to stage     CAP (community acquired pneumonia) See admit 05/17/18 ? rml  with   covid pcr neg - rx augmentin > clinically improved at f/u 05/25/2018  So f/u cxr in 4-6 weeks is fine unless condition declines   [facility]       Total time devoted to counseling  > 50 % of initial 45 min office visit:  review case with pt/ sister/ device teaching which extended face to face time for this visit  discussion of options/alternatives/ personally creating written customized instructions  in presence of pt  then going over those specific  Instructions  directly with the pt including how to use all of the meds but in particular covering each new medication in detail and the difference between the maintenance= "automatic" meds and the prns using an action plan format for the latter (If this problem/symptom => do that organization reading Left to right).  Please see AVS from this visit for a full list of these instructions which I personally wrote for this pt and  are unique to this visit.      Christinia Gully, MD 05/25/2018

## 2018-05-26 ENCOUNTER — Telehealth: Payer: Self-pay | Admitting: *Deleted

## 2018-05-26 ENCOUNTER — Encounter: Payer: Self-pay | Admitting: Internal Medicine

## 2018-05-26 NOTE — Telephone Encounter (Signed)
Call returned to sister teresa (dpr), she states they saw [name] one time which was a telephone visit and they prefer to stay with Dr. Melvyn Novas. Made aware I would let MW know. Voiced understanding. Will route message to Dr. Melvyn Novas as Juluis Rainier. Nothing further needed at this time.

## 2018-05-26 NOTE — Assessment & Plan Note (Addendum)
See admit 05/17/18 ? rml  with   covid pcr neg - rx augmentin > clinically improved at f/u 05/25/2018  So f/u cxr in 4-6 weeks is fine unless condition declines   [facility]    Total time devoted to counseling  > 50 % of initial 45 min office visit:  review case with pt/ sister/ device teaching which extended face to face time for this visit  discussion of options/alternatives/ personally creating written customized instructions  in presence of pt  then going over those specific  Instructions directly with the pt including how to use all of the meds but in particular covering each new medication in detail and the difference between the maintenance= "automatic" meds and the prns using an action plan format for the latter (If this problem/symptom => do that organization reading Left to right).  Please see AVS from this visit for a full list of these instructions which I personally wrote for this pt and  are unique to this visit.

## 2018-05-26 NOTE — Telephone Encounter (Signed)
-----   Message from Tanda Rockers, MD sent at 05/26/2018  6:15 AM EDT -----   Did not realize he was planning on f/u with [name] > will refer him back and see here prn   Cancel all f/u here

## 2018-05-26 NOTE — Telephone Encounter (Signed)
LMTCB

## 2018-05-26 NOTE — Telephone Encounter (Signed)
Pt sister [name] is calling back [phone]

## 2018-05-26 NOTE — Assessment & Plan Note (Addendum)
Quit smoking 2015  - 3 admits in time span of one year, last one 05/17/2018 with aecopd  - 05/25/2018  After extensive coaching inhaler device,  effectiveness =    75% with hfa/ 90% with dpi but ? coughing from dpi - alpha one screen 05/25/2018 >>>    >>>> Clearly  Group D in terms of symptom/risk and laba/lama/ICS  therefore appropriate rx at this point >>>  Continue dulera/incruse for now - if cough persists consider change to spiriva smi from incruse      DDX of  difficult airways management almost all start with A and  include Adherence, Ace Inhibitors, Acid Reflux, Active Sinus Disease, Alpha 1 Antitripsin deficiency, Anxiety masquerading as Airways dz,  ABPA,  Allergy(esp in young), Aspiration (esp in elderly), Adverse effects of meds,  Active smoking or vaping, A bunch of PE's (a small clot burden can't cause this syndrome unless there is already severe underlying pulm or vascular dz with poor reserve) plus two Bs  = Bronchiectasis and Beta blocker use..and one C= CHF  Adherence is always the initial "prime suspect" and is a multilayered concern that requires a "trust but verify" approach in every patient - starting with knowing how to use medications, especially inhalers, correctly, keeping up with refills and understanding the fundamental difference between maintenance and prns vs those medications only taken for a very short course and then stopped and not refilled.  - see hfa/dpi teaching - rec [facility] [name] with all meds in hand using a trust but verify approach to confirm accurate Medication  Reconciliation The principal here is that until we are certain that the  patients are doing what we've asked, it makes no sense to ask them to do more.   ? Active smoking/ vaping > denies   ? Alpha one at Def > send labs   ? Allergy/asthma > cover with high dose ICS for now   ? Bronchiectasis component > would need hrct to exclude but would not change rx for now    ? chf > bnp nl at last aecopd makes this unlikely    >>> no need for f/u here as has no funds for meds so samples given today and directed back to [name] at [facility] with pfts planned to stage

## 2018-05-26 NOTE — Telephone Encounter (Signed)
1) [name] is a really good pulmonary doctor  2) PW  has access to free meds and I don't   I can see him but can only give him 2 week samples x 2 (one month max) which is not going to work longterm so happy to see with those stipulations

## 2018-05-31 LAB — ALPHA-1 ANTITRYPSIN PHENOTYPE: A-1 Antitrypsin, Ser: 141 mg/dL (ref 83–199)

## 2018-06-01 NOTE — Progress Notes (Signed)
Spoke with the pt's daughter and notified that the LAB was normal  She verbalized understanding and will inform the pt

## 2018-06-02 ENCOUNTER — Observation Stay (HOSPITAL_COMMUNITY)
Admission: EM | Admit: 2018-06-02 | Discharge: 2018-06-03 | Disposition: A | Discharge status: Home / Self Care | Payer: Self-pay | Attending: Internal Medicine | Admitting: Internal Medicine

## 2018-06-02 ENCOUNTER — Emergency Department (HOSPITAL_COMMUNITY): Payer: Self-pay

## 2018-06-02 ENCOUNTER — Other Ambulatory Visit: Payer: Self-pay

## 2018-06-02 DIAGNOSIS — J189 Pneumonia, unspecified organism: Secondary | ICD-10-CM

## 2018-06-02 DIAGNOSIS — F1590 Other stimulant use, unspecified, uncomplicated: Secondary | ICD-10-CM | POA: Insufficient documentation

## 2018-06-02 DIAGNOSIS — S20369A Insect bite (nonvenomous) of unspecified front wall of thorax, initial encounter: Secondary | ICD-10-CM | POA: Insufficient documentation

## 2018-06-02 DIAGNOSIS — Z7951 Long term (current) use of inhaled steroids: Secondary | ICD-10-CM | POA: Insufficient documentation

## 2018-06-02 DIAGNOSIS — W57XXXA Bitten or stung by nonvenomous insect and other nonvenomous arthropods, initial encounter: Secondary | ICD-10-CM

## 2018-06-02 DIAGNOSIS — Z79899 Other long term (current) drug therapy: Secondary | ICD-10-CM | POA: Insufficient documentation

## 2018-06-02 DIAGNOSIS — I10 Essential (primary) hypertension: Secondary | ICD-10-CM | POA: Insufficient documentation

## 2018-06-02 DIAGNOSIS — Z7289 Other problems related to lifestyle: Secondary | ICD-10-CM | POA: Insufficient documentation

## 2018-06-02 DIAGNOSIS — Z836 Family history of other diseases of the respiratory system: Secondary | ICD-10-CM | POA: Insufficient documentation

## 2018-06-02 DIAGNOSIS — Z9114 Patient's other noncompliance with medication regimen: Secondary | ICD-10-CM | POA: Insufficient documentation

## 2018-06-02 DIAGNOSIS — Z87891 Personal history of nicotine dependence: Secondary | ICD-10-CM | POA: Insufficient documentation

## 2018-06-02 DIAGNOSIS — F1490 Cocaine use, unspecified, uncomplicated: Secondary | ICD-10-CM | POA: Insufficient documentation

## 2018-06-02 DIAGNOSIS — D72829 Elevated white blood cell count, unspecified: Secondary | ICD-10-CM

## 2018-06-02 DIAGNOSIS — Z8249 Family history of ischemic heart disease and other diseases of the circulatory system: Secondary | ICD-10-CM | POA: Insufficient documentation

## 2018-06-02 DIAGNOSIS — J441 Chronic obstructive pulmonary disease with (acute) exacerbation: Principal | ICD-10-CM | POA: Diagnosis present

## 2018-06-02 DIAGNOSIS — S30860A Insect bite (nonvenomous) of lower back and pelvis, initial encounter: Secondary | ICD-10-CM | POA: Insufficient documentation

## 2018-06-02 DIAGNOSIS — S20469A Insect bite (nonvenomous) of unspecified back wall of thorax, initial encounter: Secondary | ICD-10-CM

## 2018-06-02 DIAGNOSIS — Z20828 Contact with and (suspected) exposure to other viral communicable diseases: Secondary | ICD-10-CM | POA: Insufficient documentation

## 2018-06-02 DIAGNOSIS — Z8701 Personal history of pneumonia (recurrent): Secondary | ICD-10-CM

## 2018-06-02 DIAGNOSIS — F79 Unspecified intellectual disabilities: Secondary | ICD-10-CM | POA: Insufficient documentation

## 2018-06-02 LAB — CBC WITH DIFFERENTIAL/PLATELET
Abs Immature Granulocytes: 0.07 10*3/uL (ref 0.00–0.07)
Basophils Absolute: 0.1 10*3/uL (ref 0.0–0.1)
Basophils Relative: 1 %
Eosinophils Absolute: 0 10*3/uL (ref 0.0–0.5)
Eosinophils Relative: 0 %
HCT: 46.8 % (ref 39.0–52.0)
Hemoglobin: 15.4 g/dL (ref 13.0–17.0)
Immature Granulocytes: 0 %
Lymphocytes Relative: 4 %
Lymphs Abs: 0.7 10*3/uL (ref 0.7–4.0)
MCH: 32.8 pg (ref 26.0–34.0)
MCHC: 32.9 g/dL (ref 30.0–36.0)
MCV: 99.6 fL (ref 80.0–100.0)
Monocytes Absolute: 1.3 10*3/uL — ABNORMAL HIGH (ref 0.1–1.0)
Monocytes Relative: 8 %
Neutro Abs: 14.6 10*3/uL — ABNORMAL HIGH (ref 1.7–7.7)
Neutrophils Relative %: 87 %
Platelets: 228 10*3/uL (ref 150–400)
RBC: 4.7 MIL/uL (ref 4.22–5.81)
RDW: 12.4 % (ref 11.5–15.5)
WBC: 16.8 10*3/uL — ABNORMAL HIGH (ref 4.0–10.5)
nRBC: 0 % (ref 0.0–0.2)

## 2018-06-02 LAB — LACTIC ACID, PLASMA
Lactic Acid, Venous: 1.8 mmol/L (ref 0.5–1.9)
Lactic Acid, Venous: 1.3 mmol/L (ref 0.5–1.9)

## 2018-06-02 LAB — COMPREHENSIVE METABOLIC PANEL
ALT: 33 U/L (ref 0–44)
AST: 31 U/L (ref 15–41)
Albumin: 3.6 g/dL (ref 3.5–5.0)
Alkaline Phosphatase: 36 U/L — ABNORMAL LOW (ref 38–126)
Anion gap: 11 (ref 5–15)
BUN: 11 mg/dL (ref 6–20)
CO2: 22 mmol/L (ref 22–32)
Calcium: 9 mg/dL (ref 8.9–10.3)
Chloride: 104 mmol/L (ref 98–111)
Creatinine, Ser: 1.01 mg/dL (ref 0.61–1.24)
GFR calc Af Amer: >60 mL/min (ref >60)
GFR calc non Af Amer: >60 mL/min (ref >60)
Glucose, Bld: 122 mg/dL — ABNORMAL HIGH (ref 70–99)
Potassium: 4.4 mmol/L (ref 3.5–5.1)
Sodium: 137 mmol/L (ref 135–145)
Total Bilirubin: 1.1 mg/dL (ref 0.3–1.2)
Total Protein: 5.8 g/dL — ABNORMAL LOW (ref 6.5–8.1)

## 2018-06-02 LAB — MRSA PCR SCREENING: MRSA by PCR: NEGATIVE

## 2018-06-02 LAB — TROPONIN I: Troponin I: <0.03 ng/mL (ref <0.03)

## 2018-06-02 LAB — PHOSPHORUS: Phosphorus: 2.8 mg/dL (ref 2.5–4.6)

## 2018-06-02 LAB — MAGNESIUM: Magnesium: 1.6 mg/dL — ABNORMAL LOW (ref 1.7–2.4)

## 2018-06-02 LAB — BRAIN NATRIURETIC PEPTIDE: B Natriuretic Peptide: 38.8 pg/mL (ref 0.0–100.0)

## 2018-06-02 LAB — SARS CORONAVIRUS 2 BY RT PCR (HOSPITAL ORDER, PERFORMED IN ~~LOC~~ HOSPITAL LAB): SARS Coronavirus 2: NEGATIVE

## 2018-06-02 LAB — I-STAT CREATININE, ED: Creatinine, Ser: 0.8 mg/dL (ref 0.61–1.24)

## 2018-06-02 MED ORDER — AZITHROMYCIN 250 MG PO TABS: 250.0000 mg | ORAL_TABLET | Freq: Every day | ORAL | Status: DC

## 2018-06-02 MED ORDER — PREDNISONE 20 MG PO TABS
40.0000 mg | ORAL_TABLET | Freq: Every day | ORAL | Status: DC
  Administered 2018-06-03: 40 mg via ORAL
  Filled 2018-06-02: qty 2

## 2018-06-02 MED ORDER — IPRATROPIUM-ALBUTEROL 0.5-2.5 (3) MG/3ML IN SOLN
3.0000 mL | Freq: Three times a day (TID) | RESPIRATORY_TRACT | Status: DC
  Administered 2018-06-03 (×2): 3 mL via RESPIRATORY_TRACT
  Filled 2018-06-02 (×2): qty 3

## 2018-06-02 MED ORDER — FOLIC ACID 1 MG PO TABS
1.0000 mg | ORAL_TABLET | Freq: Every day | ORAL | Status: DC
  Administered 2018-06-02 – 2018-06-03 (×2): 1 mg via ORAL
  Filled 2018-06-02 (×2): qty 1

## 2018-06-02 MED ORDER — ADULT MULTIVITAMIN W/MINERALS CH
1.0000 | ORAL_TABLET | Freq: Every day | ORAL | Status: DC
  Administered 2018-06-02 – 2018-06-03 (×2): 1 via ORAL
  Filled 2018-06-02 (×2): qty 1

## 2018-06-02 MED ORDER — VANCOMYCIN HCL 10 G IV SOLR
1750.0000 mg | Freq: Once | INTRAVENOUS | Status: DC
  Administered 2018-06-02: 12:00:00 1750 mg via INTRAVENOUS
  Filled 2018-06-02: qty 1750

## 2018-06-02 MED ORDER — ALBUTEROL SULFATE (2.5 MG/3ML) 0.083% IN NEBU: 2.5000 mg | INHALATION_SOLUTION | RESPIRATORY_TRACT | Status: DC | PRN

## 2018-06-02 MED ORDER — ALBUTEROL SULFATE (2.5 MG/3ML) 0.083% IN NEBU
5.0000 mg | INHALATION_SOLUTION | Freq: Once | RESPIRATORY_TRACT | Status: AC
  Administered 2018-06-02: 12:00:00 5 mg via RESPIRATORY_TRACT
  Filled 2018-06-02: qty 6

## 2018-06-02 MED ORDER — SODIUM CHLORIDE 0.9 % IV SOLN: 2.0000 g | Freq: Three times a day (TID) | INTRAVENOUS | Status: DC

## 2018-06-02 MED ORDER — LOSARTAN POTASSIUM 50 MG PO TABS
100.0000 mg | ORAL_TABLET | Freq: Every day | ORAL | Status: DC
  Administered 2018-06-02 – 2018-06-03 (×2): 100 mg via ORAL
  Filled 2018-06-02 (×2): qty 2

## 2018-06-02 MED ORDER — AZITHROMYCIN 500 MG PO TABS: 500.0000 mg | ORAL_TABLET | Freq: Every day | ORAL | Status: DC

## 2018-06-02 MED ORDER — VITAMIN B-1 100 MG PO TABS
100.0000 mg | ORAL_TABLET | Freq: Every day | ORAL | Status: DC
  Administered 2018-06-02 – 2018-06-03 (×2): 100 mg via ORAL
  Filled 2018-06-02 (×2): qty 1

## 2018-06-02 MED ORDER — DOXYCYCLINE HYCLATE 100 MG PO TABS
200.0000 mg | ORAL_TABLET | Freq: Once | ORAL | Status: AC
  Administered 2018-06-02: 200 mg via ORAL
  Filled 2018-06-02: qty 2

## 2018-06-02 MED ORDER — IPRATROPIUM-ALBUTEROL 0.5-2.5 (3) MG/3ML IN SOLN
3.0000 mL | Freq: Four times a day (QID) | RESPIRATORY_TRACT | Status: DC
  Administered 2018-06-02 (×2): 3 mL via RESPIRATORY_TRACT
  Filled 2018-06-02 (×2): qty 3

## 2018-06-02 MED ORDER — SODIUM CHLORIDE 0.9 % IV SOLN
2.0000 g | Freq: Three times a day (TID) | INTRAVENOUS | Status: DC
  Administered 2018-06-02: 14:00:00 2 g via INTRAVENOUS
  Filled 2018-06-02: qty 2

## 2018-06-02 MED ORDER — IPRATROPIUM BROMIDE HFA 17 MCG/ACT IN AERS
4.0000 | INHALATION_SPRAY | Freq: Once | RESPIRATORY_TRACT | Status: AC
  Administered 2018-06-02: 4 via RESPIRATORY_TRACT
  Filled 2018-06-02: qty 12.9

## 2018-06-02 MED ORDER — IPRATROPIUM BROMIDE 0.02 % IN SOLN
0.5000 mg | Freq: Once | RESPIRATORY_TRACT | Status: AC
  Administered 2018-06-02: 12:00:00 0.5 mg via RESPIRATORY_TRACT
  Filled 2018-06-02: qty 2.5

## 2018-06-02 MED ORDER — AZITHROMYCIN 500 MG PO TABS
500.0000 mg | ORAL_TABLET | Freq: Once | ORAL | Status: AC
  Administered 2018-06-02: 500 mg via ORAL
  Filled 2018-06-02: qty 1

## 2018-06-02 MED ORDER — ACETAMINOPHEN 500 MG PO TABS
1000.0000 mg | ORAL_TABLET | Freq: Once | ORAL | Status: AC
  Administered 2018-06-02: 1000 mg via ORAL
  Filled 2018-06-02: qty 2

## 2018-06-02 MED ORDER — ENOXAPARIN SODIUM 40 MG/0.4ML ~~LOC~~ SOLN
40.0000 mg | SUBCUTANEOUS | Status: DC
  Administered 2018-06-02: 17:00:00 40 mg via SUBCUTANEOUS
  Filled 2018-06-02: qty 0.4

## 2018-06-02 MED ORDER — MOMETASONE FURO-FORMOTEROL FUM 200-5 MCG/ACT IN AERO
2.0000 | INHALATION_SPRAY | Freq: Two times a day (BID) | RESPIRATORY_TRACT | Status: DC
  Administered 2018-06-03: 2 via RESPIRATORY_TRACT
  Filled 2018-06-02: qty 8.8

## 2018-06-02 MED ORDER — AZITHROMYCIN 250 MG PO TABS
250.0000 mg | ORAL_TABLET | Freq: Every day | ORAL | Status: DC
  Administered 2018-06-03: 250 mg via ORAL
  Filled 2018-06-02 (×2): qty 1

## 2018-06-02 MED ORDER — ALBUTEROL SULFATE HFA 108 (90 BASE) MCG/ACT IN AERS
10.0000 | INHALATION_SPRAY | Freq: Once | RESPIRATORY_TRACT | Status: AC
  Administered 2018-06-02: 10 via RESPIRATORY_TRACT
  Filled 2018-06-02: qty 6.7

## 2018-06-02 MED ORDER — VANCOMYCIN HCL 10 G IV SOLR: 1250.0000 mg | Freq: Two times a day (BID) | INTRAVENOUS | Status: DC

## 2018-06-02 MED ORDER — UMECLIDINIUM BROMIDE 62.5 MCG/INH IN AEPB
1.0000 | INHALATION_SPRAY | Freq: Every day | RESPIRATORY_TRACT | Status: DC
  Administered 2018-06-03: 1 via RESPIRATORY_TRACT
  Filled 2018-06-02: qty 7

## 2018-06-02 MED ORDER — SODIUM CHLORIDE 0.9 % IV BOLUS
1000.0000 mL | Freq: Once | INTRAVENOUS | Status: AC
  Administered 2018-06-02: 12:00:00 1000 mL via INTRAVENOUS

## 2018-06-02 MED ORDER — AEROCHAMBER PLUS FLO-VU MEDIUM MISC
1.0000 | Freq: Once | Status: AC
  Administered 2018-06-02: 11:00:00 1
  Filled 2018-06-02: qty 1

## 2018-06-02 MED FILL — LOSARTAN POTASSIUM 100 MG T: 100 | 30 days supply | Qty: 30 | Fill #1

## 2018-06-02 NOTE — ED Provider Notes (Signed)
Medical screening examination/treatment/procedure(s) were conducted as a shared visit with non-physician practitioner(s) and myself.  I personally evaluated the patient during the encounter.  None Patient with prior history of COPD.  He reports that he is having increasing difficulty breathing.  Patient has had some cough associated with this.  Productive sputum.  No documented fever at home.  Chest pain.  Patient is alert.  He appears mildly ill.  Mild increased work of breathing but airway stable.  Speaking in full sentences without difficulty.  No peripheral edema.  Agree with plan of management.   Charlesetta Shanks, MD 06/02/18 [phone]

## 2018-06-02 NOTE — ED Triage Notes (Signed)
Pt brought in by ems for c/o sob that began around 0530 today ; pt states he has diagnosed with PNA x 2 weeks ago and has had a cough since then ; pt states he has had chest pain when he coughs ; pt received 125 of solumedrol prior to arrival

## 2018-06-02 NOTE — Progress Notes (Signed)
Pharmacy Antibiotic Note  James [name] is a 58 y.o. male admitted on 06/02/2018 with pneumonia.  Pharmacy has been consulted for vancomycin/cefepime dosing. Recently seen, received azithromycin and ceftriaxone x1 then started on Augmentin 5/15 for 6 days.   Vancomycin 1250mg  IV Q 12hrs. Goal AUC 400-550. Expected AUC: 473 SCr used: 0.8   Plan: Vancomycin 1750 mg IV x1 then 1250 mg IV q12h Cefepime 2 g IV q8h Monitor clinical status, renal function, cultures, and length of therapy Monitor vancomycin levels as indicated   Height: 5\' 7"  (170.2 cm) Weight: 194 lb 0.1 oz (88 kg) IBW/kg (Calculated) : 66.1  Temp (24hrs), Avg:99.8 F (37.7 C), Min:99.8 F (37.7 C), Max:99.8 F (37.7 C)  Recent Labs  Lab 06/02/18 0929 06/02/18 0931 06/02/18 0947  WBC 16.8*  --   --   CREATININE 1.01  --  0.80  LATICACIDVEN  --  1.8  --     Estimated Creatinine Clearance: 107.9 mL/min (by C-G formula based on SCr of 0.8 mg/dL).    No Known Allergies  Antimicrobials this admission: Vancomycin 5/29 >> Cefepime 5/29 >>  Dose adjustments this admission:   Microbiology results: 5/29 MRSA PCR: 5/29 COVID negative  5/29 BCx:   Thank you for allowing pharmacy to be a part of this patient's care.  Claiborne Billings, PharmD PGY2 Cardiology Pharmacy Resident Please check AMION for all Pharmacist numbers by unit 06/02/2018 12:49 PM

## 2018-06-02 NOTE — ED Notes (Signed)
ED TO INPATIENT HANDOFF REPORT  ED Nurse Name and Phone #: Harriette Bouillon [phone]  S Name/Age/Gender James [name] 58 y.o. male Room/Bed: 023C/023C  Code Status   Code Status: Prior  Home/SNF/Other Home Patient oriented to: self, place, time and situation Is this baseline? Yes   Triage Complete: Triage complete  Chief Complaint sob  Triage Note Pt brought in by ems for c/o sob that began around 0530 today ; pt states he has diagnosed with PNA x 2 weeks ago and has had a cough since then ; pt states he has had chest pain when he coughs ; pt received 125 of solumedrol prior to arrival    Allergies No Known Allergies  Level of Care/Admitting Diagnosis ED Disposition    ED Disposition Condition [place]: [place] [[id number]]  Level of Care: Telemetry Medical [104]  Covid Evaluation: Confirmed COVID Negative  Diagnosis: COPD exacerbation ([place]) [601093]  Admitting Physician: Cresenciano Lick  Attending Physician: Gilles Chiquito B [[id number]  PT Class (Do Not Modify): Observation [104]  PT Acc Code (Do Not Modify): Observation [10022]       B Medical/Surgery History Past Medical History:  Diagnosis Date  . COPD (chronic obstructive pulmonary disease) ([place])   . Pneumothorax    2016, fell from ladder   Past Surgical History:  Procedure Laterality Date  . VIDEO ASSISTED THORACOSCOPY (VATS)/THOROCOTOMY Left 06/11/2013   Procedure: VIDEO ASSISTED THORACOSCOPY (VATS)/THOROCOTOMY;  Surgeon: Ivin Poot, MD;  Location: [facility];  Service: Thoracic;  Laterality: Left;  [name] VIDEO BRONCHOSCOPY N/A 06/11/2013   Procedure: VIDEO BRONCHOSCOPY;  Surgeon: Ivin Poot, MD;  Location: [facility] OR;  Service: Thoracic;  Laterality: N/A;     A IV Location/Drains/Wounds Patient Lines/Drains/Airways Status   Active Line/Drains/Airways    Name:   Placement date:   Placement time:   Site:   Days:   Peripheral IV 06/02/18 Left Antecubital   06/02/18    0925     Antecubital   less than 1   External Urinary Catheter   06/15/13    0720    -   1813   Incision (Closed) 06/11/13 Chest Left   06/11/13    0812     1817   Wound / Incision (Open or Dehisced) 06/22/13  Chest Right;Anterior 3 small red pustules   06/22/13    1813    Chest   1806          Intake/Output Last 24 hours  Intake/Output Summary (Last 24 hours) at 06/02/2018 1258 Last data filed at 06/02/2018 1258 Gross per 24 hour  Intake 1000 ml  Output -  Net 1000 ml    Labs/Imaging Results for orders placed or performed during the hospital encounter of 06/02/18 (from the past 48 hour(s))  CBC with Differential/Platelet     Status: Abnormal   Collection Time: 06/02/18  9:29 AM  Result Value Ref Range   WBC 16.8 (H) 4.0 - 10.5 K/uL   RBC 4.70 4.22 - 5.81 MIL/uL   Hemoglobin 15.4 13.0 - 17.0 g/dL   HCT 46.8 39.0 - 52.0 %   MCV 99.6 80.0 - 100.0 fL   MCH 32.8 26.0 - 34.0 pg   MCHC 32.9 30.0 - 36.0 g/dL   RDW 12.4 11.5 - 15.5 %   Platelets 228 150 - 400 K/uL   nRBC 0.0 0.0 - 0.2 %   Neutrophils Relative % 87 %   Neutro Abs  14.6 (H) 1.7 - 7.7 K/uL   Lymphocytes Relative 4 %   Lymphs Abs 0.7 0.7 - 4.0 K/uL   Monocytes Relative 8 %   Monocytes Absolute 1.3 (H) 0.1 - 1.0 K/uL   Eosinophils Relative 0 %   Eosinophils Absolute 0.0 0.0 - 0.5 K/uL   Basophils Relative 1 %   Basophils Absolute 0.1 0.0 - 0.1 K/uL   Immature Granulocytes 0 %   Abs Immature Granulocytes 0.07 0.00 - 0.07 K/uL    Comment: Performed at [facility] [address], [place], [place] [address]  Comprehensive metabolic panel     Status: Abnormal   Collection Time: 06/02/18  9:29 AM  Result Value Ref Range   Sodium 137 135 - 145 mmol/L   Potassium 4.4 3.5 - 5.1 mmol/L   Chloride 104 98 - 111 mmol/L   CO2 22 22 - 32 mmol/L   Glucose, Bld 122 (H) 70 - 99 mg/dL   BUN 11 6 - 20 mg/dL   Creatinine, Ser 1.01 0.61 - 1.24 mg/dL   Calcium 9.0 8.9 - 10.3 mg/dL   Total Protein 5.8 (L) 6.5 - 8.1 g/dL    Albumin 3.6 3.5 - 5.0 g/dL   AST 31 15 - 41 U/L   ALT 33 0 - 44 U/L   Alkaline Phosphatase 36 (L) 38 - 126 U/L   Total Bilirubin 1.1 0.3 - 1.2 mg/dL   GFR calc non Af Amer >60 >60 mL/min   GFR calc Af Amer >60 >60 mL/min   Anion gap 11 5 - 15    Comment: Performed at [facility] [address], [place], [place] [address]  Troponin I - ONCE - STAT     Status: None   Collection Time: 06/02/18  9:29 AM  Result Value Ref Range   Troponin I <0.03 <0.03 ng/mL    Comment: Performed at [facility] [address], [place],  [address]  SARS Coronavirus 2 (CEPHEID- Performed in [facility] hospital lab), Hosp Order     Status: None   Collection Time: 06/02/18  9:30 AM  Result Value Ref Range   SARS Coronavirus 2 NEGATIVE NEGATIVE    Comment: (NOTE) If result is NEGATIVE SARS-CoV-2 target nucleic acids are NOT DETECTED. The SARS-CoV-2 RNA is generally detectable in upper and lower  respiratory specimens during the acute phase of infection. The lowest  concentration of SARS-CoV-2 viral copies this assay can detect is 250  copies / mL. A negative result does not preclude SARS-CoV-2 infection  and should not be used as the sole basis for treatment or other  patient management decisions.  A negative result may occur with  improper specimen collection / handling, submission of specimen other  than nasopharyngeal swab, presence of viral mutation(s) within the  areas targeted by this assay, and inadequate number of viral copies  (<250 copies / mL). A negative result must be combined with clinical  observations, patient history, and epidemiological information. If result is POSITIVE SARS-CoV-2 target nucleic acids are DETECTED. The SARS-CoV-2 RNA is generally detectable in upper and lower  respiratory specimens dur ing the acute phase of infection.  Positive  results are indicative of active infection with SARS-CoV-2.  Clinical  correlation with patient history and other  diagnostic information is  necessary to determine patient infection status.  Positive results do  not rule out bacterial infection or co-infection with other viruses. If result is PRESUMPTIVE POSTIVE SARS-CoV-2 nucleic acids MAY  BE PRESENT.   A presumptive positive result was obtained on the submitted specimen  and confirmed on repeat testing.  While 2019 novel coronavirus  (SARS-CoV-2) nucleic acids may be present in the submitted sample  additional confirmatory testing may be necessary for epidemiological  and / or clinical management purposes  to differentiate between  SARS-CoV-2 and other Sarbecovirus currently known to infect humans.  If clinically indicated additional testing with an alternate test  methodology [id number]) is advised. The SARS-CoV-2 RNA is generally  detectable in upper and lower respiratory sp ecimens during the acute  phase of infection. The expected result is Negative. Fact Sheet for Patients:  StrictlyIdeas.no Fact Sheet for Healthcare Providers: BankingDealers.co.za This test is not yet approved or cleared by the [place] FDA and has been authorized for detection and/or diagnosis of SARS-CoV-2 by FDA under an Emergency Use Authorization (EUA).  This EUA will remain in effect (meaning this test can be used) for the duration of the COVID-19 declaration under Section 564(b)(1) of the Act, 21 U.S.C. section 360bbb-3(b)(1), unless the authorization is terminated or revoked sooner. Performed at [facility], [place] [address] [place], [place] [address]   Lactic acid, plasma     Status: None   Collection Time: 06/02/18  9:31 AM  Result Value Ref Range   Lactic Acid, Venous 1.8 0.5 - 1.9 mmol/L    Comment: Performed at [facility] [address], [place], [place] [address]  Brain natriuretic peptide     Status: None   Collection Time: 06/02/18  9:36 AM  Result Value Ref Range   B Natriuretic Peptide  38.8 0.0 - 100.0 pg/mL    Comment: Performed at [facility] [address] [place], [place] [address]  I-Stat Creatinine, ED (not at [facility])     Status: None   Collection Time: 06/02/18  9:47 AM  Result Value Ref Range   Creatinine, Ser 0.80 0.61 - 1.24 mg/dL  Lactic acid, plasma     Status: None   Collection Time: 06/02/18 11:31 AM  Result Value Ref Range   Lactic Acid, Venous 1.3 0.5 - 1.9 mmol/L    Comment: Performed at [facility] [address], [place], [place] [address]   Dg Chest Port 1 View  Result Date: 06/02/2018 CLINICAL DATA:  Fever EXAM: PORTABLE CHEST 1 VIEW COMPARISON:  05/17/2018 FINDINGS: Continued focal opacity at the right lung base, similar to prior study. No confluent opacity on the left. Heart is normal size. Mild peribronchial thickening. No effusions. Multiple old healed bilateral rib fractures. IMPRESSION: Continued focal airspace opacity in the right lung base compatible with pneumonia, not significantly changed. Peribronchial thickening/bronchitic changes. Electronically Signed   By: Rolm Baptise M.D.   On: 06/02/2018 11:04    Pending Labs Unresulted Labs (From admission, onward)    Start     Ordered   06/02/18 1133  MRSA PCR Screening  Once,   R     06/02/18 1134   06/02/18 0931  Blood culture (routine x 2)  BLOOD CULTURE X 2,   STAT     06/02/18 0931   06/02/18 0930  Urinalysis, Routine w reflex microscopic  ONCE - STAT,   STAT     06/02/18 0931   06/02/18 0930  Rapid urine drug screen (hospital performed)  ONCE - STAT,   R     06/02/18 0931          Vitals/Pain Today's Vitals  06/02/18 1138 06/02/18 1139 06/02/18 1200 06/02/18 1230  BP:   (!) 142/82 (!) 147/92  Pulse: (!) 113  (!) 104 (!) 106  Resp: (!) 23  (!) 22 (!) 23  Temp: 99.8 F (37.7 C)     TempSrc: Oral     SpO2: 94%  95% 94%  Weight:      Height:      PainSc:  0-No pain      Isolation Precautions Droplet and Contact precautions  Medications Medications   vancomycin (VANCOCIN) 1,750 mg in sodium chloride 0.9 % 500 mL IVPB (1,750 mg Intravenous New Bag/Given 06/02/18 1218)  ceFEPIme (MAXIPIME) 2 g in sodium chloride 0.9 % 100 mL IVPB (has no administration in time range)  vancomycin (VANCOCIN) 1,250 mg in sodium chloride 0.9 % 250 mL IVPB (has no administration in time range)  albuterol (VENTOLIN HFA) 108 (90 Base) MCG/ACT inhaler 10 puff (10 puffs Inhalation Given 06/02/18 1035)  ipratropium (ATROVENT HFA) inhaler 4 puff (4 puffs Inhalation Given 06/02/18 1035)  AeroChamber Plus Flo-Vu Medium MISC 1 each (1 each Other Given 06/02/18 1035)  acetaminophen (TYLENOL) tablet 1,000 mg (1,000 mg Oral Given 06/02/18 0943)  albuterol (PROVENTIL) (2.5 MG/3ML) 0.083% nebulizer solution 5 mg (5 mg Nebulization Given 06/02/18 1218)  ipratropium (ATROVENT) nebulizer solution 0.5 mg (0.5 mg Nebulization Given 06/02/18 1218)  sodium chloride 0.9 % bolus 1,000 mL (0 mLs Intravenous Stopped 06/02/18 1258)    Mobility walks Low fall risk   Focused Assessments Pulmonary Assessment Handoff:  Lung sounds: Bilateral Breath Sounds: Expiratory wheezes L Breath Sounds: Expiratory wheezes R Breath Sounds: Expiratory wheezes O2 Device: Nasal Cannula O2 Flow Rate (L/min): 2 L/min      R Recommendations: See Admitting Provider Note  Report given to:   Additional Notes:

## 2018-06-02 NOTE — H&P (Signed)
Date: 06/02/2018               Patient Name:  James [name] MRN: [id number]  DOB: 1960/01/28 Age / Sex: 58 y.o., male   PCP: Scot Jun, FNP         Medical Service: Internal Medicine Teaching Service         Attending Physician: Dr. Sid Falcon, MD    First Contact: Dr. Sharon Seller Pager: [phone]  Second Contact: Dr. Frederico Hamman Pager: [phone]       After Hours (After 5p/  First Contact Pager: [phone]  weekends / holidays): Second Contact Pager: [phone]   Chief Complaint: Shortness of breath  History of Present Illness:  James [name] is a 58yo male with PMH hypertension, Group D COPD, alochol use, crack and cocaine use presenting with acute on chronic shortness of breath, cough, and dyspnea on exertion. He was in his usual state of health until last night when he started to experience weakness and dizziness. This morning he had shortness of breath and a cough productive of white sputum. He was recently admitted with COPD exacerbation secondary to RLL pneumonia and was treated with duonebs, steroids and antibiotics. He completed his course of Augmentin and prednisone taper but cannot remember when his prednisone taper was completed. He states the prednisone helped his breathing a lot. States his breathing was better while on the steroids. He does not use oxygen at home. Reports he has been using his inhalers, but only uses them as needed. He also does not recall which inhalers he is on. Denies fever, chills, chest pain, pleurisy, eye pain, or sinus pain, and abdominal pain. He saw Dr. Melvyn Novas in 5/21. Per his note, patient does not have appropriate inhaler technique. He also coughs after using inhaler.  Pruritic rash on RLE, states it started to have spots after scratching.   In the ED, he was afebile, tachycardic, hypertensive, and tachypneic with RR 20s. He was placed on oxygen via nasal cannula. He also received Solumedrol by EMS. Lab work was remarkable for leukocytosis 16.8. He  received cefepime and vanc for possible RLL pneumonia. COVID negative.   Social: Endorses alcohol use. Last drink was last night. Has used crack and meth in the past and quit 2-3 months ago. Denies current use. Denies tobacco use. Quit smoking in 2015. He does smoke marijuana every night.  Family History:  Family History  Problem Relation Age of Onset  . Heart disease Father   . COPD Sister     Meds:  Current Meds  Medication Sig  . albuterol (PROVENTIL HFA;VENTOLIN HFA) 108 (90 Base) MCG/ACT inhaler Inhale 1-2 puffs into the lungs every 6 (six) hours as needed for wheezing or shortness of breath.  [name] albuterol (PROVENTIL) (2.5 MG/3ML) 0.083% nebulizer solution Take 3 mLs (2.5 mg total) by nebulization every 6 (six) hours as needed for wheezing or shortness of breath.  . folic acid (FOLVITE) 1 MG tablet Take 1 tablet (1 mg total) by mouth daily.  [name] losartan (COZAAR) 100 MG tablet Take 1 tablet (100 mg total) by mouth daily.  . mometasone-formoterol (DULERA) 200-5 MCG/ACT AERO Inhale 2 puffs into the lungs 2 (two) times daily.  [name] umeclidinium bromide (INCRUSE ELLIPTA) 62.5 MCG/INH AEPB Inhale 1 puff into the lungs daily.     Allergies: Allergies as of 06/02/2018  . (No Known Allergies)   Past Medical History:  Diagnosis Date  . COPD (chronic obstructive pulmonary disease) ([place])   .  Pneumothorax    2016, fell from ladder     Review of Systems: A complete ROS was negative except as per HPI.   Physical Exam: Blood pressure 140/76, pulse 96, temperature 98.9 F (37.2 C), temperature source Oral, resp. rate (!) 22, height 5\' 9"  (1.753 m), weight 84.4 kg, SpO2 97 %.  Constitution: receiving duoneb treatment, anxious Cardio: tachycardic, distant heart sounds, no m/r/g heard Respiratory: wheezing in all four lung fields, tachypnic, no rales or rhonchi  Abdominal: soft, non-distended, +BS  MSK: no LE edema, moving all extremities, RLE purpura Neuro: alert & oriented, anxious  appearing  Skin: diaphoretic, two large ticks on back with mild surrounding erythema, anterior left collarbone nymph tick          EKG: personally reviewed my interpretation is sinus tachycardia.   CXR: personally reviewed my interpretation is RLL consolidation similar to previous consolidation   Assessment & Plan by Problem: Active Problems:   COPD exacerbation ([place])  James [name] is a 58yo male with PMH hypertension, Group D COPD, alochol use, crack and cocaine use presenting with acute on chronic shortness of breath, cough, and dyspnea on exertion,   Group D COPD Exacerbation Acute on chronic shortness of breath that seems to have gotten worse last night with productive cough. Diffuse wheezing in all four quadrants at admission and requiring 2L [place] O2. He is feeling better after receiving nebulizer treatments and solumedrol. He recently completed prednisone taper, which helped his symptoms at home. He is on dulera and incruse at home but appears to be using this as needed and not scheduled. Per Dr. Melvyn Novas who he sees in the outpatient setting he has a lot of difficulty understanding proper use of his inhalers and adherence has been a concern. Recently treated for pneumonia and symptoms are consistent with COPD exacerbation and Chest xray similar to prior. Will expand therapy if symptoms do not improve or fever develops. No PFTs on file.   - prednisone 40 mg 5 days  - respiratory therapy consulted for inahaler education  - azithromycin 5 days  - duonebs q6h  - cont. dulera and incruse  - am CMP, CBC  - repeat EKG   Ticks Two large ticks removed from back. Appear to be lone star. Lyme is extremely rare in [place] but will treat prophylactically with one dose doxycycline 200 mg qd.   Drug Use Disorder Alcohol Use Disorder Last used cocaine and meth three weeks ago. Drinks a six pack of beer per night. He states he has never   - CIWA without ativan  - folic acid, vitamin, thiamine    Hypertension - continue home losartan 100 mg qd  Diet: regular  VTE: lovenox IVF: none  Code: full   Dispo: Admit patient to Observation with expected length of stay less than 2 midnights.  SignedMarty Heck, DO 06/02/2018, 3:09 PM  Pager: [phone]

## 2018-06-02 NOTE — ED Provider Notes (Signed)
[facility] EMERGENCY DEPARTMENT Provider Note   CSN: [id number] Arrival date & time: 06/02/18  0901    History   Chief Complaint Chief Complaint  Patient presents with  . Shortness of Breath    HPI James [name] is a 58 y.o. male.     HPI  Patient is a 66-year male with past medical history of COPD, pneumothorax alcohol use, cocaine use and methamphetamine use presenting for shortness of breath.  Patient reports that he woke up early this morning having difficulty breathing.  He reports that he has had a persistent cough for this admitted to the hospital and sometimes it is productive of sputum.  He reports that he feels chills and diaphoresis, but has not recorded a fever at home.  He denies any chest pain.  He denies any headaches, visual disturbance, rhinorrhea, sore throat, abdominal pain, nausea, vomiting, or rash.  Patient reports that he takes multiple inhalers both controller and rescue.  He is followed by pulmonology.  He has no known exposures to individuals with COVID-19. Last cocaine use and meth use was 3 weeks ago.  Patient reports that he drinks 6 beers a night and denies ever going into alcohol withdrawal.  Last drink at 10 PM last night.  Patient received Solu-Medrol 125 per EMS.  Past Medical History:  Diagnosis Date  . COPD (chronic obstructive pulmonary disease) ([place])   . Pneumothorax    2016, fell from ladder    Patient Active Problem List   Diagnosis Date Noted  . CAP (community acquired pneumonia) 05/17/2018  . COPD exacerbation ([place]) 02/16/2018  . Hypertension 05/17/2016  . Low back pain radiating to both legs 04/18/2016  . Pneumonia 04/06/2016  . History of tobacco abuse 04/06/2016  . Alcohol abuse 04/06/2016  . Elevated liver enzymes 04/06/2016  . COPD mixed type ([place]) 03/26/2016  . Acute on chronic respiratory failure with hypoxia ([place]) 06/08/2013    Past Surgical History:  Procedure Laterality Date  . VIDEO ASSISTED  THORACOSCOPY (VATS)/THOROCOTOMY Left 06/11/2013   Procedure: VIDEO ASSISTED THORACOSCOPY (VATS)/THOROCOTOMY;  Surgeon: Ivin Poot, MD;  Location: [facility];  Service: Thoracic;  Laterality: Left;  [name] VIDEO BRONCHOSCOPY N/A 06/11/2013   Procedure: VIDEO BRONCHOSCOPY;  Surgeon: Ivin Poot, MD;  Location: [facility] OR;  Service: Thoracic;  Laterality: N/A;        Home Medications    Prior to Admission medications   Medication Sig Start Date End Date Taking? Authorizing Provider  albuterol (PROVENTIL HFA;VENTOLIN HFA) 108 (90 Base) MCG/ACT inhaler Inhale 1-2 puffs into the lungs every 6 (six) hours as needed for wheezing or shortness of breath. 03/29/18   Scot Jun, FNP  albuterol (PROVENTIL) (2.5 MG/3ML) 0.083% nebulizer solution Take 3 mLs (2.5 mg total) by nebulization every 6 (six) hours as needed for wheezing or shortness of breath. 03/14/18   Scot Jun, FNP  amoxicillin-clavulanate (AUGMENTIN) 875-125 MG tablet Take 1 tablet by mouth every 12 (twelve) hours. 05/19/18   Seawell, Jaimie A, DO  benzonatate (TESSALON) 100 MG capsule Take 1-2 capsules (100-200 mg total) by mouth 3 (three) times daily as needed for cough. 03/29/18   Scot Jun, FNP  folic acid (FOLVITE) 1 MG tablet Take 1 tablet (1 mg total) by mouth daily. 03/30/18   Scot Jun, FNP  losartan (COZAAR) 100 MG tablet Take 1 tablet (100 mg total) by mouth daily. 04/27/18   [name], MD  mometasone-formoterol (DULERA) 200-5 MCG/ACT AERO Inhale 2  puffs into the lungs 2 (two) times daily. 03/29/18   Scot Jun, FNP  Respiratory Therapy Supplies (FLUTTER) DEVI Use 4 times daily 04/27/18   [name], MD  thiamine 100 MG tablet Take 1 tablet (100 mg total) by mouth daily. 03/30/18   Scot Jun, FNP  umeclidinium bromide (INCRUSE ELLIPTA) 62.5 MCG/INH AEPB Inhale 1 puff into the lungs daily. 03/29/18   Scot Jun, FNP    Family History Family History  Problem Relation Age of  Onset  . Heart disease Father   . COPD Sister     Social History Social History   Tobacco Use  . Smoking status: Former Smoker    Packs/day: 1.50    Years: 40.00    Pack years: 60.00    Types: Cigarettes  . Smokeless tobacco: Current User    Types: Chew  Substance Use Topics  . Alcohol use: Yes    Alcohol/week: 21.0 standard drinks    Types: 21 Cans of beer per week  . Drug use: Yes    Types: Marijuana    Comment: daily      Allergies   Patient has no known allergies.   Review of Systems Review of Systems  Constitutional: Positive for chills. Negative for fever.  HENT: Negative for congestion, rhinorrhea, sinus pain and sore throat.   Eyes: Negative for visual disturbance.  Respiratory: Positive for cough and shortness of breath. Negative for chest tightness.   Cardiovascular: Negative for chest pain, palpitations and leg swelling.  Gastrointestinal: Negative for abdominal pain, diarrhea, nausea and vomiting.  Genitourinary: Negative for dysuria and flank pain.  Musculoskeletal: Negative for back pain and myalgias.  Skin: Negative for rash.  Neurological: Negative for dizziness, syncope, light-headedness and headaches.      Physical Exam Updated Vital Signs BP 121/68   Pulse (!) 113   Temp 99.8 F (37.7 C) (Oral)   Resp (!) 23   Ht 5\' 7"  (1.702 m)   Wt 88 kg   SpO2 94%   BMI 30.39 kg/m   Physical Exam Vitals signs and nursing note reviewed.  Constitutional:      Appearance: He is well-developed. He is diaphoretic.     Comments: In some respiratory discomfort but able to speak in full sentences.   HENT:     Head: Normocephalic and atraumatic.  Eyes:     Conjunctiva/sclera: Conjunctivae normal.     Pupils: Pupils are equal, round, and reactive to light.  Neck:     Musculoskeletal: Normal range of motion and neck supple.  Cardiovascular:     Rate and Rhythm: Regular rhythm. Tachycardia present.     Heart sounds: S1 normal and S2 normal. No murmur.   Pulmonary:     Effort: Pulmonary effort is normal.     Breath sounds: Examination of the right-upper field reveals wheezing. Examination of the left-upper field reveals wheezing. Examination of the right-middle field reveals wheezing. Examination of the left-middle field reveals wheezing. Examination of the right-lower field reveals wheezing. Examination of the left-lower field reveals wheezing. Wheezing present.     Comments: Lung bases diminished.  Abdominal:     General: There is no distension.     Palpations: Abdomen is soft.     Tenderness: There is no abdominal tenderness. There is no guarding.  Musculoskeletal: Normal range of motion.        General: No deformity.  Lymphadenopathy:     Cervical: No cervical adenopathy.  Skin:    General:  Skin is warm.     Findings: No erythema or rash.  Neurological:     Mental Status: He is alert.     Comments: Cranial nerves grossly intact. Patient moves extremities symmetrically and with good coordination.  Psychiatric:        Behavior: Behavior normal.        Thought Content: Thought content normal.        Judgment: Judgment normal.      ED Treatments / Results  Labs (all labs ordered are listed, but only abnormal results are displayed) Labs Reviewed  CBC WITH DIFFERENTIAL/PLATELET - Abnormal; Notable for the following components:      Result Value   WBC 16.8 (*)    Neutro Abs 14.6 (*)    Monocytes Absolute 1.3 (*)    All other components within normal limits  COMPREHENSIVE METABOLIC PANEL - Abnormal; Notable for the following components:   Glucose, Bld 122 (*)    Total Protein 5.8 (*)    Alkaline Phosphatase 36 (*)    All other components within normal limits  SARS CORONAVIRUS 2 (HOSPITAL ORDER, [facility])  CULTURE, BLOOD (ROUTINE X 2)  CULTURE, BLOOD (ROUTINE X 2)  MRSA PCR SCREENING  TROPONIN I  LACTIC ACID, PLASMA  BRAIN NATRIURETIC PEPTIDE  URINALYSIS, ROUTINE W REFLEX MICROSCOPIC  RAPID  URINE DRUG SCREEN, HOSP PERFORMED  LACTIC ACID, PLASMA  I-STAT CREATININE, ED    EKG None  Radiology Dg Chest Port 1 View  Result Date: 06/02/2018 CLINICAL DATA:  Fever EXAM: PORTABLE CHEST 1 VIEW COMPARISON:  05/17/2018 FINDINGS: Continued focal opacity at the right lung base, similar to prior study. No confluent opacity on the left. Heart is normal size. Mild peribronchial thickening. No effusions. Multiple old healed bilateral rib fractures. IMPRESSION: Continued focal airspace opacity in the right lung base compatible with pneumonia, not significantly changed. Peribronchial thickening/bronchitic changes. Electronically Signed   By: Rolm Baptise M.D.   On: 06/02/2018 11:04    Procedures Procedures (including critical care time)  CRITICAL CARE Performed by: Albesa Seen   Total critical care time: 35 minutes  Critical care time was exclusive of separately billable procedures and treating other patients.  Critical care was necessary to treat or prevent imminent or life-threatening deterioration.  Critical care was time spent personally by me on the following activities: development of treatment plan with patient and/or surrogate as well as nursing, discussions with consultants, evaluation of patient's response to treatment, examination of patient, obtaining history from patient or surrogate, ordering and performing treatments and interventions, ordering and review of laboratory studies, ordering and review of radiographic studies, pulse oximetry and re-evaluation of patient's condition.   Medications Ordered in ED Medications  albuterol (PROVENTIL) (2.5 MG/3ML) 0.083% nebulizer solution 5 mg (has no administration in time range)  ipratropium (ATROVENT) nebulizer solution 0.5 mg (has no administration in time range)  sodium chloride 0.9 % bolus 1,000 mL (has no administration in time range)  ceFEPIme (MAXIPIME) 2 g in sodium chloride 0.9 % 100 mL IVPB (has no administration in  time range)  vancomycin (VANCOCIN) 1,750 mg in sodium chloride 0.9 % 500 mL IVPB (has no administration in time range)  albuterol (VENTOLIN HFA) 108 (90 Base) MCG/ACT inhaler 10 puff (10 puffs Inhalation Given 06/02/18 1035)  ipratropium (ATROVENT HFA) inhaler 4 puff (4 puffs Inhalation Given 06/02/18 1035)  AeroChamber Plus Flo-Vu Medium MISC 1 each (1 each Other Given 06/02/18 1035)  acetaminophen (TYLENOL) tablet 1,000 mg (1,000  mg Oral Given 06/02/18 0943)     Initial Impression / Assessment and Plan / ED Course  I have reviewed the triage vital signs and the nursing notes.  Pertinent labs & imaging results that were available during my care of the patient were reviewed by me and considered in my medical decision making (see chart for details).  Clinical Course as of Jun 02 1203  Fri Jun 02, 2018  1125 Consistent with value 2 weeks ago.   WBC(!): 16.8 [AM]  1127 Will give nebulizers.   SARS Coronavirus 2: NEGATIVE [AM]  1129 Spoke with Raquel Sarna, PharmD who recommends Vanc + Cefepime until MRSA PCR swab returns.    [AM]    Clinical Course User Index [AM] Albesa Seen, PA-C       Patient is critically ill and requiring a higher level of care. Sepsis suspected versus COVID-19 versus COPD exacerbation. Code sepsis called. Patient meeting SIRS criteria based on tachycardia, tachypnea, and leukocytosis. See vitals below. Suspected source of infection pulmonary based on exam, recent PNA diagnosis. Lactic acid normal. No significant end organ dysfunction.  Consider pulmonary embolism, but given the extent of patient's wheezing on lung exam, tactile fever.  Multiple reasons for tachycardia, find infection and COPD exacerbation more likely.  Vitals:   06/02/18 1100 06/02/18 1130 06/02/18 1132 06/02/18 1138  BP: 126/73 121/68    Pulse: (!) 120  (!) 108 (!) 113  Resp: (!) 23  (!) 21 (!) 23  Temp:    99.8 F (37.7 C)  TempSrc:    Oral  SpO2: 94%  94% 94%  Weight:      Height:          Broad spectrum antibiotics initiated based on suspected source of infection.  He was given 1 L normal saline.  Patient was tested for SARS-CoV-2 which returned negative.  He has a persistent right lower lobe infiltrate from his prior exam.  Per pharmacy, patient given vancomycin and cefepime.  MRSA PCR screening was ordered.  See Solu-Medrol per EMS, each of a nebulized treatments emergency department.  Had improvement in tachypnea and tachycardia.  Patient admitted to Internal Medicine service, Dr. Sharon Seller admitting. Appreciate their involvement.   Final Clinical Impressions(s) / ED Diagnoses   Final diagnoses:  Pneumonia of right lower lobe due to infectious organism [facility])  COPD exacerbation (HCC)  Leukocytosis, unspecified type    ED Discharge Orders    None       Tamala Julian 06/02/18 1709    Charlesetta Shanks, MD 06/17/18 1428

## 2018-06-02 NOTE — ED Notes (Signed)
Pt refusing rectal temp; PA aware

## 2018-06-03 ENCOUNTER — Encounter (HOSPITAL_COMMUNITY): Payer: Self-pay | Admitting: *Deleted

## 2018-06-03 DIAGNOSIS — F79 Unspecified intellectual disabilities: Secondary | ICD-10-CM

## 2018-06-03 DIAGNOSIS — W57XXXA Bitten or stung by nonvenomous insect and other nonvenomous arthropods, initial encounter: Secondary | ICD-10-CM

## 2018-06-03 DIAGNOSIS — Z7951 Long term (current) use of inhaled steroids: Secondary | ICD-10-CM

## 2018-06-03 HISTORY — DX: Bitten or stung by nonvenomous insect and other nonvenomous arthropods, initial encounter: W57.XXXA

## 2018-06-03 LAB — COMPREHENSIVE METABOLIC PANEL
ALT: 21 U/L (ref 0–44)
AST: 15 U/L (ref 15–41)
Albumin: 3 g/dL — ABNORMAL LOW (ref 3.5–5.0)
Alkaline Phosphatase: 31 U/L — ABNORMAL LOW (ref 38–126)
Anion gap: 13 (ref 5–15)
BUN: 16 mg/dL (ref 6–20)
CO2: 27 mmol/L (ref 22–32)
Calcium: 9.7 mg/dL (ref 8.9–10.3)
Chloride: 102 mmol/L (ref 98–111)
Creatinine, Ser: 0.81 mg/dL (ref 0.61–1.24)
GFR calc Af Amer: >60 mL/min (ref >60)
GFR calc non Af Amer: >60 mL/min (ref >60)
Glucose, Bld: 117 mg/dL — ABNORMAL HIGH (ref 70–99)
Potassium: 4 mmol/L (ref 3.5–5.1)
Sodium: 142 mmol/L (ref 135–145)
Total Bilirubin: 0.7 mg/dL (ref 0.3–1.2)
Total Protein: 5.6 g/dL — ABNORMAL LOW (ref 6.5–8.1)

## 2018-06-03 LAB — CBC
HCT: 41.5 % (ref 39.0–52.0)
Hemoglobin: 13.9 g/dL (ref 13.0–17.0)
MCH: 33 pg (ref 26.0–34.0)
MCHC: 33.5 g/dL (ref 30.0–36.0)
MCV: 98.6 fL (ref 80.0–100.0)
Platelets: 184 10*3/uL (ref 150–400)
RBC: 4.21 MIL/uL — ABNORMAL LOW (ref 4.22–5.81)
RDW: 12.3 % (ref 11.5–15.5)
WBC: 19.9 10*3/uL — ABNORMAL HIGH (ref 4.0–10.5)
nRBC: 0 % (ref 0.0–0.2)

## 2018-06-03 MED ORDER — AZITHROMYCIN 250 MG PO TABS: ORAL_TABLET | ORAL | 0 refills | Status: DC

## 2018-06-03 MED ORDER — PREDNISONE 20 MG PO TABS: 40.0000 mg | ORAL_TABLET | Freq: Every day | ORAL | 0 refills | Status: DC

## 2018-06-03 NOTE — Discharge Summary (Signed)
Name: James [name] MRN: [id number] DOB: 04-22-1960 58 y.o. PCP: Scot Jun, FNP  Date of Admission: 06/02/2018  9:05 AM Date of Discharge: 06/03/2018 Attending Physician: Sid Falcon, MD  Discharge Diagnosis: 1. COPD exacerbation   Discharge Medications: Allergies as of 06/03/2018   No Known Allergies     Medication List    TAKE these medications   albuterol (2.5 MG/3ML) 0.083% nebulizer solution Commonly known as:  PROVENTIL Take 3 mLs (2.5 mg total) by nebulization every 6 (six) hours as needed for wheezing or shortness of breath.   albuterol 108 (90 Base) MCG/ACT inhaler Commonly known as:  VENTOLIN HFA Inhale 1-2 puffs into the lungs every 6 (six) hours as needed for wheezing or shortness of breath.   azithromycin 250 MG tablet Commonly known as:  ZITHROMAX Take 1 tablet (250 mg) every day for 3 days.   Flutter Devi Use 4 times daily   folic acid 1 MG tablet Commonly known as:  FOLVITE Take 1 tablet (1 mg total) by mouth daily.   losartan 100 MG tablet Commonly known as:  Cozaar Take 1 tablet (100 mg total) by mouth daily.   mometasone-formoterol 200-5 MCG/ACT Aero Commonly known as:  DULERA Inhale 2 puffs into the lungs 2 (two) times daily.   predniSONE 20 MG tablet Commonly known as:  DELTASONE Take 2 tablets (40 mg total) by mouth daily with breakfast.   thiamine 100 MG tablet Take 1 tablet (100 mg total) by mouth daily.   umeclidinium bromide 62.5 MCG/INH Aepb Commonly known as:  Incruse Ellipta Inhale 1 puff into the lungs daily.       Disposition and follow-up:   [name] was discharged from [facility] in Stable condition.  At the hospital follow up visit please address:  1.  Please continue to encourage compliance with home inhalers. Patient has intellectual disability and will need continuous education on appropriate use of inhalers. Of note, patient unable to follow instructions for prednisone taper  at home.   2.  Labs / imaging needed at time of follow-up: None   3.  Pending labs/ test needing follow-up: None   Follow-up Appointments: Follow-up Information    Scot Jun, FNP.   Specialty:  Family Medicine Why:  Please schedule a hospital follow-up appointment with your regular doctor within 1 week. Contact information: [place] [address] [phone]        Scot Jun, FNP.   Specialty:  Family Medicine Contact information: [address] [place] [address] [phone]           Hospital Course by problem list:  1. COPD exacerbation: Patient presented with a 2-day history of shortness of breath and productive cough consistent with COPD exacerbation after recent discharge from the hospital 2 weeks ago for the same. COVID 19 negative. This was thought to be secondary to poor compliance with home inhalers and inappropriate steroid taper at home. He was treated with scheduled duonebs, azithromycin, and prednisone. His respiratory status was back to baseline the next day and he was discharged home in stable condition. I dicussed appropriate use of home inhalers both with him and his sister. His sister will schedule follow up with his PCP within 1 week. Will need close follow up and continuous education of inhaler and steroid use.   Discharge Vitals:   BP 123/88 (BP Location: Right Arm)   Pulse 86   Temp 97.9 F (  36.6 C) (Oral)   Resp 18   Ht 5\' 9"  (1.753 m)   Wt 83.7 kg   SpO2 94%   BMI 27.26 kg/m   Pertinent Labs, Studies, and Procedures:   CBC Latest Ref Rng & Units 06/03/2018 06/02/2018 05/18/2018  WBC 4.0 - 10.5 K/uL 19.9(H) 16.8(H) 18.4(H)  Hemoglobin 13.0 - 17.0 g/dL 13.9 15.4 14.2  Hematocrit 39.0 - 52.0 % 41.5 46.8 42.9  Platelets 150 - 400 K/uL 184 228 227   BMP Latest Ref Rng & Units 06/03/2018 06/02/2018 06/02/2018  Glucose 70 - 99 mg/dL 117(H) - 122(H)  BUN 6 - 20 mg/dL 16 - 11  Creatinine 0.61 -  1.24 mg/dL 0.81 0.80 1.01  BUN/Creat Ratio 9 - 20 - - -  Sodium 135 - 145 mmol/L 142 - 137  Potassium 3.5 - 5.1 mmol/L 4.0 - 4.4  Chloride 98 - 111 mmol/L 102 - 104  CO2 22 - 32 mmol/L 27 - 22  Calcium 8.9 - 10.3 mg/dL 9.7 - 9.0   CXR 06/02/2018: FINDINGS: Continued focal opacity at the right lung base, similar to prior study. No confluent opacity on the left. Heart is normal size. Mild peribronchial thickening. No effusions. Multiple old healed bilateral rib fractures.  IMPRESSION: Continued focal airspace opacity in the right lung base compatible with pneumonia, not significantly changed. Peribronchial thickening/bronchitic changes.    Discharge Instructions: Discharge Instructions    Call MD for:  difficulty breathing, headache or visual disturbances   Complete by:  As directed    Call MD for:  extreme fatigue   Complete by:  As directed    Diet - low sodium heart healthy   Complete by:  As directed    Discharge instructions   Complete by:  As directed    [name], James [name] were admitted to the hospital with a COPD exacerbation.  A chest x-ray showed the old pneumonia from 2 weeks ago but no new pneumonia.  We think this happened because you have not use your COPD inhalers correctly at home.  You should have 3 inhalers for your COPD at home:  1- Incruse Ellipta: You will use this 1 every day.  1 puff every day even when you are feeling good and do not have shortness of breath.   2- Dulera: This inhaler you will also use every day.  Do 2 puffs 2 times a day.  It is important that you do this every day even when you are feeling good and do not have shortness of breath as well.  3- Albuterol: This is your rescue inhaler.  You should only use your albuterol inhaler when you are feeling short of breath.   I sent a prescription for azithromycin and prednisone for the next 3 days to your pharmacy.  I spoke to your sister who will arrange a follow-up appointment with your  regular doctor.  - Dr. Frederico Hamman   Increase activity slowly   Complete by:  As directed       Signed: Welford Roche, MD 06/05/2018, 1:11 PM   Pager: [phone]

## 2018-06-03 NOTE — Progress Notes (Addendum)
   Subjective:  No acute events overnight. Patient doing very well this morning. States he is feeling back to his baseline. He denies chest pain, shortness of breath, back pain, and itching at site of tick bite. Discussed importance of adherence to inhalers. Patient asked me to call his sister to discuss plan.   Objective:  Vital signs in last 24 hours: Vitals:   06/03/18 0046 06/03/18 0517 06/03/18 0521 06/03/18 0820  BP: 137/79 (!) 142/85  134/84  Pulse: 75 76  81  Resp: 20 20  18   Temp: 97.9 F (36.6 C) 98.2 F (36.8 C)  97.8 F (36.6 C)  TempSrc: Oral Oral  Oral  SpO2: 96% 97%  97%  Weight:   83.7 kg   Height:       General: eating breakfast in bed in no acute distress CV: RRR, nl S1/S2, no mrg  Pulm: decreased breath sounds throughout, no wheezes or crackles, comfortable on RA  Ext: warm and well perfused without edema  Skin: site of tick bites appear erythematous, no rash   Assessment/Plan:  Principal Problem:   COPD exacerbation (HCC) Active Problems:   Tick bite  Katlin Ciszewski is a 58yo male with PMH hypertension, Group D COPD, alochol use disroder, crack and cocaine use presenting with a COPD exacerbation likely secondary to inappropriate use of home inhalers.   # Group D COPD Exacerbation Suspect this is secondary to poorly controlled COPD due to medication non-adherence. He is doing well today. He is off of supplemental oxygen and without wheezing on exam. I spoke to his sister [name] this AM who states Mr. Knick has intellectual disability and does not understand how to use his inhalers. She also does not think he tapered prednisone as he was told to 2 weeks ago on day of discharge. She is not able to visit him every day, but will try to call him on a daily basis to remind him to use his inhalers daily and not as needed as he is currently doing. I will provide instructions in his discharge summary on how to use all his inhalers. I also discussed this with his sister.    - Azithromycin and prednisone 40 mg x 5 days  - RT therapy consulted for inahaler education  - Duonebs q6h PRN  - Continue home Dulera and Incruse, educated patient and sister on appropriate use - Sister will arrange follow up with PCP this week    # Tick bite:  Two large ticks removed from back and one from upper chest. Appear to be lone [place]. Discussed with ID. Lyme is extremely rare in [place] he was treated prophylactically with one dose doxycycline 200 mg QD .   # Drug Use Disorder # Alcohol Use Disorder Last used cocaine and meth three weeks ago. Drinks a six pack of beer per night. Last drink was the night prior to admission. Denies history of withdrawals.  - CIWA without ativan  - folic acid, vitamin, thiamine   # Hypertension: Continue home losartan 100 mg QD.    Dispo: Anticipated discharge in approximately 0-1 day(s).   Welford Roche, MD 06/03/2018, 8:42 AM Pager: [phone]

## 2018-06-03 NOTE — Progress Notes (Signed)
Nsg Discharge Note  Admit Date:  06/02/2018 Discharge date: 06/03/2018   James [name] to be D/C'd home. Patient/caregiver able to verbalize understanding.  Discharge Medication: Allergies as of 06/03/2018   No Known Allergies     Medication List    TAKE these medications   albuterol (2.5 MG/3ML) 0.083% nebulizer solution Commonly known as:  PROVENTIL Take 3 mLs (2.5 mg total) by nebulization every 6 (six) hours as needed for wheezing or shortness of breath.   albuterol 108 (90 Base) MCG/ACT inhaler Commonly known as:  VENTOLIN HFA Inhale 1-2 puffs into the lungs every 6 (six) hours as needed for wheezing or shortness of breath.   azithromycin 250 MG tablet Commonly known as:  ZITHROMAX Take 1 tablet (250 mg) every day for 3 days. Start taking on:  Jun 04, 2018   Flutter Devi Use 4 times daily   folic acid 1 MG tablet Commonly known as:  FOLVITE Take 1 tablet (1 mg total) by mouth daily.   losartan 100 MG tablet Commonly known as:  Cozaar Take 1 tablet (100 mg total) by mouth daily.   mometasone-formoterol 200-5 MCG/ACT Aero Commonly known as:  DULERA Inhale 2 puffs into the lungs 2 (two) times daily.   predniSONE 20 MG tablet Commonly known as:  DELTASONE Take 2 tablets (40 mg total) by mouth daily with breakfast. Start taking on:  Jun 04, 2018   thiamine 100 MG tablet Take 1 tablet (100 mg total) by mouth daily.   umeclidinium bromide 62.5 MCG/INH Aepb Commonly known as:  Incruse Ellipta Inhale 1 puff into the lungs daily.       Discharge Assessment: Vitals:   06/03/18 0845 06/03/18 1229  BP:  123/88  Pulse:  86  Resp:  18  Temp:  97.9 F (36.6 C)  SpO2: 97% 94%   Skin clean, dry and intact without evidence of skin break down, no evidence of skin tears noted. IV catheter discontinued intact. Site without signs and symptoms of complications - no redness or edema noted at insertion site, patient denies c/o pain - only slight tenderness at site.   Dressing with slight pressure applied.  D/c Instructions-Education: Discharge instructions given to patient/family with verbalized understanding. D/c education completed with patient/family including follow up instructions, medication list, d/c activities limitations if indicated, with other d/c instructions as indicated by MD - patient able to verbalize understanding, all questions fully answered. Patient instructed to return to ED, call 911, or call MD for any changes in condition.  Patient escorted via [place], and D/C home via private auto.  Thanh Pomerleau, Jolene Schimke, RN 06/03/2018 2:54 PM

## 2018-06-05 MED FILL — predniSONE 20 MG TABS: 20 | 3 days supply | Qty: 6 | Fill #0

## 2018-06-05 MED FILL — AZITHROMYCIN 250 MG TABLET: 250 | 3 days supply | Qty: 3 | Fill #0

## 2018-06-07 LAB — CULTURE, BLOOD (ROUTINE X 2)
Culture: NO GROWTH
Culture: NO GROWTH
Special Requests: ADEQUATE
Special Requests: ADEQUATE

## 2018-06-13 ENCOUNTER — Ambulatory Visit (INDEPENDENT_AMBULATORY_CARE_PROVIDER_SITE_OTHER): Payer: Self-pay | Admitting: Family Medicine

## 2018-06-13 ENCOUNTER — Telehealth: Payer: Self-pay | Admitting: Family Medicine

## 2018-06-13 ENCOUNTER — Telehealth: Payer: Self-pay | Admitting: *Deleted

## 2018-06-13 ENCOUNTER — Other Ambulatory Visit: Payer: Self-pay

## 2018-06-13 DIAGNOSIS — R0602 Shortness of breath: Secondary | ICD-10-CM

## 2018-06-13 DIAGNOSIS — J441 Chronic obstructive pulmonary disease with (acute) exacerbation: Secondary | ICD-10-CM

## 2018-06-13 DIAGNOSIS — R05 Cough: Secondary | ICD-10-CM

## 2018-06-13 MED ORDER — AMOXICILLIN-POT CLAVULANATE 875-125 MG PO TABS: 1.0000 | ORAL_TABLET | Freq: Two times a day (BID) | ORAL | 0 refills | Status: AC

## 2018-06-13 MED ORDER — PREDNISONE 20 MG PO TABS: ORAL_TABLET | ORAL | 0 refills | Status: DC

## 2018-06-13 NOTE — Telephone Encounter (Signed)
Opened in Error.

## 2018-06-13 NOTE — Telephone Encounter (Signed)
Please have patient added to my schedule this afternoon for a telemedicine encounter in order for me to assess the patient. If he is having severe respiratory distress, he will need to the ER via EMS, given he was recently hospitalized, his condition could be worsening.

## 2018-06-13 NOTE — Progress Notes (Deleted)
Called patient to initiate their telephone visit with provider Molli Barrows, FNP-C. Verified date of birth. Patient states that he was able to get Azithromycin & Prednisone. Completed the Azithromycin but states that he lost the prescription for the Prednisone. Is still having SHOB, productive cough & slight wheezing. KWalker, CMA.

## 2018-06-13 NOTE — Telephone Encounter (Signed)
Spoke with patient and informed him with what provider stated and patient verbalized understanding. Patient scheduled a tele appt with provider for this afternoon with provider.

## 2018-06-13 NOTE — Telephone Encounter (Signed)
Caller Name: [name]      Reason for Call:  Medication refill for predniSONE (DELTASONE) 20 MG tablet [[id number]]   If this is a medication request: confirm pharmacy  [facility] pharmacy   Verify call back number:    Action taken by recipient of request:

## 2018-06-13 NOTE — Progress Notes (Signed)
Virtual Visit via Telephone Note  I connected with James [name] on 06/13/18 at  3:10 PM EDT by telephone and verified that I am speaking with the correct person using two identifiers.  Location: Patient: Located at home during today's encounter  Provider: Located at primary care office    I discussed the limitations, risks, security and privacy concerns of performing an evaluation and management service by telephone and the availability of in person appointments. I also discussed with the patient that there may be a patient responsible charge related to this service. The patient expressed understanding and agreed to proceed.   History of Present Illness: James [name], history of COPD, presents via telephone encounter requesting another round of prednisone to help improve work of breathing. Keefer was recently hospitalized for an acute COPD exacerbation, 06/02/18. Prior to 06/02/18 admission, he was previously admitted for COPD exacerbation and pneumonia 05/17/18. He has been COVID-19 tested during both admissions and both tests were negative. He complains of today of worsening shortness of breath, wheezing, and continuous cough. He endorses use of his inhalers as prescribed. He completed the 3 days of  Azithromycin that was prescribed. He reports he feels much better while taking the prednisone and once it is completed, shortness of breath, wheezing, and coughing reoccurs.    Social History   Socioeconomic History  . Marital status: Divorced    Spouse name: Not on file  . Number of children: Not on file  . Years of education: Not on file  . Highest education level: Not on file  Occupational History  . Occupation: Dealer  . Occupation: [employer]  . Financial resource strain: Not on file  . Food insecurity    Worry: Not on file    Inability: Not on file  . Transportation needs    Medical: Not on file    Non-medical: Not on file  Tobacco Use  . Smoking status: Former  Smoker    Packs/day: 1.50    Years: 40.00    Pack years: 60.00    Types: Cigarettes  . Smokeless tobacco: Current User    Types: Chew  Substance and Sexual Activity  . Alcohol use: Yes    Alcohol/week: 21.0 standard drinks    Types: 21 Cans of beer per week  . Drug use: Yes    Types: Marijuana    Comment: daily   . Sexual activity: Not Currently    Partners: Female  Lifestyle  . Physical activity    Days per week: Not on file    Minutes per session: Not on file  . Stress: Not on file  Relationships  . Social Herbalist on phone: Not on file    Gets together: Not on file    Attends religious service: Not on file    Active member of club or organization: Not on file    Attends meetings of clubs or organizations: Not on file    Relationship status: Not on file  . Intimate partner violence    Fear of current or ex partner: Not on file    Emotionally abused: Not on file    Physically abused: Not on file    Forced sexual activity: Not on file  Other Topics Concern  . Not on file  Social History Narrative   Lives alone near [place]   Observations/Objective: Breath sounds ( wheezes) are audible during phone encounter Dry hacking cough   Assessment and Plan: 1. COPD exacerbation (  [place]) -Placing patient on prednisone-and will also continue course of antibiotics with Augmentin 1 tablet twice daily for 10 days.  Patient is scheduled to follow-up in office on 06/20/2018 we will evaluate him further at that time. He is scheduled to follow-up with Dr. Melvyn Novas, pulmonologist 08/28/18.  Meds ordered this encounter  Medications  . predniSONE (DELTASONE) 20 MG tablet    Sig: Take 3 PO QAM x3days, 2 PO QAM x3days, 1 PO QAM x3days    Dispense:  18 tablet    Refill:  0  . amoxicillin-clavulanate (AUGMENTIN) 875-125 MG tablet    Sig: Take 1 tablet by mouth 2 (two) times daily for 7 days.    Dispense:  14 tablet    Refill:  0    Follow Up Instructions: RTC: 06/20/18  Follow-up COPD and Hypertension   I discussed the assessment and treatment plan with the patient. The patient was provided an opportunity to ask questions and all were answered. The patient agreed with the plan and demonstrated an understanding of the instructions.   The patient was advised to call back or seek an in-person evaluation if the symptoms worsen or if the condition fails to improve as anticipated.  I provided 20 minutes of non-face-to-face time during this encounter.   Molli Barrows, FNP

## 2018-06-13 NOTE — Telephone Encounter (Signed)
Spoke with patient sister due to patient wanting her to call office. Per pt sister, patient is requesting refills for his Prednisone. She is stating patient is tell her he can not breath and he is congested and can not seem to get the mucus out of his chest. Staff asked patient sister if patient is complaining of breathing, is he using all of his inhalers as directed and his nebulizer. Per pt sister, from her knowledge patient is using all inhalers as directed but he still can't seem to breath. Per pt sister patient is a bit slow in following directions but patient is saying he can't breath and they had to call the ambulance again. Per pt sister, that's why she's calling to see if provider could refill patient Prednisone.   Prednisone was last filled 06-04-18 with 6 tabs  Patient hospital follow up appt is 06-20-18.

## 2018-06-14 MED FILL — AMOX-CLAV 875-125 MG TABLET: 875-125 | 14 days supply | Qty: 14 | Fill #0

## 2018-06-15 ENCOUNTER — Ambulatory Visit: Payer: Self-pay | Admitting: Family Medicine

## 2018-06-16 MED FILL — !VENTOLIN HFA INHALER: 108 (90 BAS | 25 days supply | Qty: 18 | Fill #2

## 2018-06-16 MED FILL — !INCRUSE ELLIPTA 62.5 MCG I: 62.5 | 30 days supply | Qty: 30 | Fill #2

## 2018-06-19 ENCOUNTER — Telehealth: Payer: Self-pay

## 2018-06-19 NOTE — Telephone Encounter (Signed)
Mask patient and ensure he is not in the lobby. He suffers from chronic COPD therefore cough and SOB are both expected.

## 2018-06-19 NOTE — Telephone Encounter (Signed)
Called patient to do their pre-visit COVID screening.  Have you recently traveled internationally([place], [place], [place], [place], [place]) or within the [place] to a hotspot area([place], [place], [place], [place], [place])? no  Are you currently experiencing any of the following: fever, cough, SHOB, fatigue, body aches, loss of smell, rash, diarrhea, vomiting, severe headaches, weakness, sore throat? Patient is still having cough, SHOB  Have you been in contact with anyone who has recently travelled? no  Have you been in contact with anyone who is experiencing any of the above symptoms or been diagnosed with COVID  or works in or has recently visited a SNF? no

## 2018-06-20 ENCOUNTER — Other Ambulatory Visit: Payer: Self-pay

## 2018-06-20 ENCOUNTER — Ambulatory Visit (INDEPENDENT_AMBULATORY_CARE_PROVIDER_SITE_OTHER): Payer: Self-pay | Admitting: Family Medicine

## 2018-06-20 ENCOUNTER — Encounter: Payer: Self-pay | Admitting: Family Medicine

## 2018-06-20 ENCOUNTER — Telehealth: Payer: Self-pay | Admitting: Family Medicine

## 2018-06-20 ENCOUNTER — Ambulatory Visit (INDEPENDENT_AMBULATORY_CARE_PROVIDER_SITE_OTHER): Payer: Self-pay

## 2018-06-20 VITALS — BP 148/90 | HR 96 | Temp 97.8°F | Resp 18 | Ht 67.0 in | Wt 188.0 lb

## 2018-06-20 DIAGNOSIS — R7309 Other abnormal glucose: Secondary | ICD-10-CM

## 2018-06-20 DIAGNOSIS — J441 Chronic obstructive pulmonary disease with (acute) exacerbation: Secondary | ICD-10-CM

## 2018-06-20 DIAGNOSIS — J168 Pneumonia due to other specified infectious organisms: Secondary | ICD-10-CM

## 2018-06-20 DIAGNOSIS — I10 Essential (primary) hypertension: Secondary | ICD-10-CM

## 2018-06-20 DIAGNOSIS — J189 Pneumonia, unspecified organism: Secondary | ICD-10-CM

## 2018-06-20 DIAGNOSIS — Z9189 Other specified personal risk factors, not elsewhere classified: Secondary | ICD-10-CM

## 2018-06-20 DIAGNOSIS — Z9114 Patient's other noncompliance with medication regimen: Secondary | ICD-10-CM

## 2018-06-20 MED ORDER — ALBUTEROL SULFATE (2.5 MG/3ML) 0.083% IN NEBU: 2.5000 mg | INHALATION_SOLUTION | Freq: Four times a day (QID) | RESPIRATORY_TRACT | 1 refills | Status: DC | PRN

## 2018-06-20 MED ORDER — PREDNISONE 20 MG PO TABS: 20.0000 mg | ORAL_TABLET | Freq: Every day | ORAL | 0 refills | Status: AC

## 2018-06-20 MED FILL — ALBUTEROL 0.083% INHAL SOLN: (2.5 MG/3ML | 12 days supply | Qty: 150 | Fill #0

## 2018-06-20 MED FILL — predniSONE 20 MG TABS: 20 | 3 days supply | Qty: 3 | Fill #0

## 2018-06-20 NOTE — Progress Notes (Signed)
Patient ID: James [name], male    DOB: 1960-02-28, 58 y.o.   MRN: [id number]  PCP: Scot Jun, FNP  Chief Complaint  Patient presents with  . Pneumonia    Subjective:  HPI  James [name] is a 58 y.o. male presents for evaluation   has Acute on chronic respiratory failure with hypoxia ([place]); COPD mixed type ([place]); Pneumonia; History of tobacco abuse; Alcohol abuse; Elevated liver enzymes; Low back pain radiating to both legs; Hypertension; COPD exacerbation ([place]); CAP (community acquired pneumonia); and Tick bite on their problem list.   COPD exacerbation  Patient has been hospitalized twice within the last 30 days for complications related to COPD and Pneumonia. During a recent telemedicine encounter on 06/13/18, patient was prescribed another round of antibiotics and a nine day prednisone taper. He reports today, taking all of the prednisone with last dose yesterday. He thinks he took 60 mg daily per day. He is not utilizing his nebulizer as prescribed. Reports uses nebulizer once prior to bedtime.  Nebulizer at night. Albuterol inhaler twice daily  Took blood pressure prior to appointment  Some wheezing  Social History   Socioeconomic History  . Marital status: Divorced    Spouse name: Not on file  . Number of children: Not on file  . Years of education: Not on file  . Highest education level: Not on file  Occupational History  . Occupation: Dealer  . Occupation: [employer]  . Financial resource strain: Not on file  . Food insecurity    Worry: Not on file    Inability: Not on file  . Transportation needs    Medical: Not on file    Non-medical: Not on file  Tobacco Use  . Smoking status: Former Smoker    Packs/day: 1.50    Years: 40.00    Pack years: 60.00    Types: Cigarettes  . Smokeless tobacco: Current User    Types: Chew  Substance and Sexual Activity  . Alcohol use: Yes    Alcohol/week: 21.0 standard drinks    Types: 21 Cans of beer per  week  . Drug use: Yes    Types: Marijuana    Comment: daily   . Sexual activity: Not Currently    Partners: Female  Lifestyle  . Physical activity    Days per week: Not on file    Minutes per session: Not on file  . Stress: Not on file  Relationships  . Social Herbalist on phone: Not on file    Gets together: Not on file    Attends religious service: Not on file    Active member of club or organization: Not on file    Attends meetings of clubs or organizations: Not on file    Relationship status: Not on file  . Intimate partner violence    Fear of current or ex partner: Not on file    Emotionally abused: Not on file    Physically abused: Not on file    Forced sexual activity: Not on file  Other Topics Concern  . Not on file  Social History Narrative   Lives alone near [place]    Family History  Problem Relation Age of Onset  . Heart disease Father   . COPD Sister      Review of Systems Pertinent negatives listed in HPI No Known Allergies  Prior to Admission medications   Medication Sig Start Date End Date Taking? Authorizing Provider  albuterol (PROVENTIL HFA;VENTOLIN HFA) 108 (90 Base) MCG/ACT inhaler Inhale 1-2 puffs into the lungs every 6 (six) hours as needed for wheezing or shortness of breath. 03/29/18   Scot Jun, FNP  albuterol (PROVENTIL) (2.5 MG/3ML) 0.083% nebulizer solution Take 3 mLs (2.5 mg total) by nebulization every 6 (six) hours as needed for wheezing or shortness of breath. 03/14/18   Scot Jun, FNP  amoxicillin-clavulanate (AUGMENTIN) 875-125 MG tablet Take 1 tablet by mouth 2 (two) times daily for 7 days. 06/13/18 06/20/18  Scot Jun, FNP  folic acid (FOLVITE) 1 MG tablet Take 1 tablet (1 mg total) by mouth daily. 03/30/18   Scot Jun, FNP  losartan (COZAAR) 100 MG tablet Take 1 tablet (100 mg total) by mouth daily. 04/27/18   [name], MD  mometasone-formoterol (DULERA) 200-5 MCG/ACT AERO Inhale  2 puffs into the lungs 2 (two) times daily. 03/29/18   Scot Jun, FNP  predniSONE (DELTASONE) 20 MG tablet Take 3 PO QAM x3days, 2 PO QAM x3days, 1 PO QAM x3days 06/13/18   Scot Jun, FNP  Respiratory Therapy Supplies (FLUTTER) DEVI Use 4 times daily 04/27/18   [name], MD  umeclidinium bromide (INCRUSE ELLIPTA) 62.5 MCG/INH AEPB Inhale 1 puff into the lungs daily. 03/29/18   Scot Jun, FNP    Past Medical, Surgical Family and Social History reviewed and updated.    Objective:  There were no vitals filed for this visit.  BP Readings from Last 3 Encounters:  06/03/18 123/88  05/25/18 126/74  05/18/18 140/89    There were no vitals filed for this visit.     Physical Exam General appearance: alert, well developed, well nourished, cooperative and in no distress Head: Normocephalic, without obvious abnormality, atraumatic Respiratory: Respirations even and unlabored, normal respiratory rate Heart: rate and rhythm normal. No gallop or murmurs noted on exam  Extremities: No gross deformities Skin: Skin color, texture, turgor normal. No rashes seen  Psych: Appropriate mood and affect. Neurologic: Mental status: Alert, oriented to person, place, and time, thought content appropriate.  No results found for: POCGLU  Lab Results  Component Value Date   HGBA1C 6.0 (H) 03/14/2018  Dg Chest 2 View Result Date: 06/20/2018 CLINICAL DATA:  History of pneumonia, follow-up EXAM: CHEST - 2 VIEW COMPARISON:  06/02/2018 FINDINGS: Patchy right lower lobe airspace disease most consistent with improving pneumonia. No pleural effusion or pneumothorax. Stable cardiomediastinal silhouette. No aggressive osseous lesion. IMPRESSION: Patchy right lower lobe airspace disease most consistent with improving pneumonia. Electronically Signed   By: Kathreen Devoid   On: 06/20/2018 12:04    Assessment & Plan:  1. COPD exacerbation ([place])  - DG Chest 2 View; Future - Comprehensive  metabolic panel  2. Pneumonia due to infectious organism, unspecified laterality, unspecified part of lung -CBC, elevated WBC x  2 weeks prior. -Chest x-ray today shows improvement of right lobe pneumonia  -Complete antibiotics.   3. Elevated glucose, secondary to prolonged prednisone use . Rechecking Hemoglobin A1c  4. At risk for medication nonadherence -Educated patient extensively regarding use of inhaled therapy along with nebulizer treatments.  Patient encouraged to use nebulizer treatments at least twice a day opposed to once daily to facilitate improvement of breathing.  Patient also encouraged to use albuterol inhaler as needed for acute shortness of breath or wheezing and can be used every 4-6 hours as needed.  Patient verbalized understanding.  Also advised patient against repeated use of prednisone treatment for  shortness of breath.  Patient recently took prednisone taper inappropriately at high doses.  Prescribed prednisone 20 mg x 3 days to taper him down from recent high doses that he took incorrectly.  Patient advised that prednisone use as a last resort to manage shortness of improvement.  He must improve compliance with inhaled cortical steroid therapy along with albuterol.  5.  Essential hypertension -Elevated today x 2 readings. -Will re-evaluate BP at follow-up in 4 weeks and add medication if BP not at goal.  RTC: Follow-up in 4 weeks for COPD follow-up along with blood pressure.   Molli Barrows, FNP Primary Care at [facility] [address] [place] [phone]: [phone]

## 2018-06-20 NOTE — Telephone Encounter (Signed)
Voicemail full.

## 2018-06-20 NOTE — Telephone Encounter (Signed)
Notify patient recent x-ray shows improving pneumonia. Complete current antibiotic. Use nebulizer and inhaled therapies as prescribed. Keep follow-up appointment 4 weeks.

## 2018-06-21 LAB — COMPREHENSIVE METABOLIC PANEL
ALT: 37 IU/L (ref 0–44)
AST: 34 IU/L (ref 0–40)
Albumin/Globulin Ratio: 2.2 (ref 1.2–2.2)
Albumin: 4.1 g/dL (ref 3.8–4.9)
Alkaline Phosphatase: 43 IU/L (ref 39–117)
BUN/Creatinine Ratio: 25 — ABNORMAL HIGH (ref 9–20)
BUN: 19 mg/dL (ref 6–24)
Bilirubin Total: 0.4 mg/dL (ref 0.0–1.2)
CO2: 26 mmol/L (ref 20–29)
Calcium: 9.5 mg/dL (ref 8.7–10.2)
Chloride: 102 mmol/L (ref 96–106)
Creatinine, Ser: 0.77 mg/dL (ref 0.76–1.27)
GFR calc Af Amer: 116 mL/min/{1.73_m2} (ref >59)
GFR calc non Af Amer: 101 mL/min/{1.73_m2} (ref >59)
Globulin, Total: 1.9 g/dL (ref 1.5–4.5)
Glucose: 119 mg/dL — ABNORMAL HIGH (ref 65–99)
Potassium: 4.6 mmol/L (ref 3.5–5.2)
Sodium: 142 mmol/L (ref 134–144)
Total Protein: 6 g/dL (ref 6.0–8.5)

## 2018-06-21 LAB — HEMOGLOBIN A1C
Est. average glucose Bld gHb Est-mCnc: 134 mg/dL
Hgb A1c MFr Bld: 6.3 % — ABNORMAL HIGH (ref 4.8–5.6)

## 2018-06-21 LAB — CBC
Hematocrit: 41.5 % (ref 37.5–51.0)
Hemoglobin: 14.6 g/dL (ref 13.0–17.7)
MCH: 33 pg (ref 26.6–33.0)
MCHC: 35.2 g/dL (ref 31.5–35.7)
MCV: 94 fL (ref 79–97)
Platelets: 281 10*3/uL (ref 150–450)
RBC: 4.42 x10E6/uL (ref 4.14–5.80)
RDW: 11.6 % (ref 11.6–15.4)
WBC: 11.1 10*3/uL — ABNORMAL HIGH (ref 3.4–10.8)

## 2018-06-22 NOTE — Telephone Encounter (Signed)
VM full

## 2018-06-26 NOTE — Progress Notes (Signed)
Patient notified of results & recommendations. Expressed understanding.

## 2018-06-26 NOTE — Telephone Encounter (Signed)
Patient notified of xray results & recommendations. Expressed understanding.

## 2018-07-10 MED FILL — LOSARTAN POTASSIUM 100 MG T: 100 | 30 days supply | Qty: 30 | Fill #2

## 2018-07-13 ENCOUNTER — Other Ambulatory Visit: Payer: Self-pay

## 2018-07-13 ENCOUNTER — Encounter: Payer: Self-pay | Admitting: Emergency Medicine

## 2018-07-13 ENCOUNTER — Ambulatory Visit
Admission: EM | Admit: 2018-07-13 | Discharge: 2018-07-13 | Disposition: A | Discharge status: Home / Self Care | Payer: Self-pay | Attending: Family Medicine | Admitting: Family Medicine

## 2018-07-13 DIAGNOSIS — R079 Chest pain, unspecified: Secondary | ICD-10-CM

## 2018-07-13 MED ORDER — PREDNISONE 50 MG PO TABS: ORAL_TABLET | ORAL | 0 refills | Status: DC

## 2018-07-13 MED FILL — predniSONE 10 MG TABS: 10 | 3 days supply | Qty: 15 | Fill #0

## 2018-07-13 NOTE — ED Provider Notes (Addendum)
[facility]    CSN: [id number] Arrival date & time: 07/13/18  1312      History   Chief Complaint Chief Complaint  Patient presents with  . Chest Pain    HPI James [name] is a 58 y.o. male history of COPD, hypertension, previous tobacco use, chronic respiratory failure, presenting today for evaluation of chest pain.  Earlier this morning while he was driving to work he had an episode of chest pain in the left side of his chest for approximately 2 to 3 minutes.  Symptoms resolved.  Symptoms have not returned throughout the day or while he was at work.  States that it was a sharp sensation and felt as if it was his heart.  He denies associated shortness of breath more from his baseline.  Denies nausea or vomiting.  Denies left arm pain or numbness.  Denies dizziness or lightheadedness.  Denies headaches or vision changes.  He has returned to his baseline shortness of breath with his COPD.  He was recently admitted 06/02/2018 for pneumonia and COPD exacerbation, chest x-ray 2 weeks ago shows this is improving.  Recently was on a course of prednisone approximately 5 days ago, patient is requesting another course today.  Denies leg pain or leg swelling.  Denies any current chest pain.  Denies symptoms in relation to eating.  Denies radiation of pain earlier when it was happening.  HPI  Past Medical History:  Diagnosis Date  . COPD (chronic obstructive pulmonary disease) ([place])   . Pneumothorax    2016, fell from ladder    Patient Active Problem List   Diagnosis Date Noted  . Tick bite 06/03/2018  . CAP (community acquired pneumonia) 05/17/2018  . COPD exacerbation ([place]) 02/16/2018  . Hypertension 05/17/2016  . Low back pain radiating to both legs 04/18/2016  . Pneumonia 04/06/2016  . History of tobacco abuse 04/06/2016  . Alcohol abuse 04/06/2016  . Elevated liver enzymes 04/06/2016  . COPD mixed type ([place]) 03/26/2016  . Acute on chronic respiratory failure with hypoxia  ([place]) 06/08/2013    Past Surgical History:  Procedure Laterality Date  . VIDEO ASSISTED THORACOSCOPY (VATS)/THOROCOTOMY Left 06/11/2013   Procedure: VIDEO ASSISTED THORACOSCOPY (VATS)/THOROCOTOMY;  Surgeon: Ivin Poot, MD;  Location: [facility];  Service: Thoracic;  Laterality: Left;  [name] VIDEO BRONCHOSCOPY N/A 06/11/2013   Procedure: VIDEO BRONCHOSCOPY;  Surgeon: Ivin Poot, MD;  Location: [facility] OR;  Service: Thoracic;  Laterality: N/A;       Home Medications    Prior to Admission medications   Medication Sig Start Date End Date Taking? Authorizing Provider  albuterol (PROVENTIL HFA;VENTOLIN HFA) 108 (90 Base) MCG/ACT inhaler Inhale 1-2 puffs into the lungs every 6 (six) hours as needed for wheezing or shortness of breath. 03/29/18   Scot Jun, FNP  albuterol (PROVENTIL) (2.5 MG/3ML) 0.083% nebulizer solution Take 3 mLs (2.5 mg total) by nebulization every 6 (six) hours as needed for wheezing or shortness of breath. 06/20/18   Scot Jun, FNP  folic acid (FOLVITE) 1 MG tablet Take 1 tablet (1 mg total) by mouth daily. 03/30/18   Scot Jun, FNP  losartan (COZAAR) 100 MG tablet Take 1 tablet (100 mg total) by mouth daily. 04/27/18   [name], MD  mometasone-formoterol (DULERA) 200-5 MCG/ACT AERO Inhale 2 puffs into the lungs 2 (two) times daily. 03/29/18   Scot Jun, FNP  predniSONE (DELTASONE) 50 MG tablet Take 50 mg daily for 3 days  with food 07/13/18   Neamiah Sciarra, [place] C, PA-C  Respiratory Therapy Supplies (FLUTTER) DEVI Use 4 times daily 04/27/18   [name], MD  umeclidinium bromide (INCRUSE ELLIPTA) 62.5 MCG/INH AEPB Inhale 1 puff into the lungs daily. 03/29/18   Scot Jun, FNP    Family History Family History  Problem Relation Age of Onset  . Heart disease Father   . COPD Sister     Social History Social History   Tobacco Use  . Smoking status: Former Smoker    Packs/day: 1.50    Years: 40.00    Pack years: 60.00     Types: Cigarettes  . Smokeless tobacco: Current User    Types: Chew  Substance Use Topics  . Alcohol use: Yes    Alcohol/week: 21.0 standard drinks    Types: 21 Cans of beer per week  . Drug use: Yes    Types: Marijuana    Comment: daily      Allergies   Patient has no known allergies.   Review of Systems Review of Systems  Constitutional: Negative for fatigue and fever.  HENT: Negative for congestion, sinus pressure and sore throat.   Eyes: Negative for photophobia, pain and visual disturbance.  Respiratory: Negative for cough and shortness of breath.   Cardiovascular: Positive for chest pain. Negative for leg swelling.  Gastrointestinal: Negative for abdominal pain, nausea and vomiting.  Genitourinary: Negative for decreased urine volume and hematuria.  Musculoskeletal: Negative for myalgias, neck pain and neck stiffness.  Neurological: Negative for dizziness, syncope, facial asymmetry, speech difficulty, weakness, light-headedness, numbness and headaches.     Physical Exam Triage Vital Signs ED Triage Vitals  Enc Vitals Group     BP 07/13/18 1324 123/73     Pulse Rate 07/13/18 1324 (!) 105     Resp 07/13/18 1324 (!) 22     Temp 07/13/18 1324 98 F (36.7 C)     Temp Source 07/13/18 1324 Oral     SpO2 07/13/18 1324 95 %     Weight --      Height --      Head Circumference --      Peak Flow --      Pain Score 07/13/18 1322 0     Pain Loc --      Pain Edu? --      Excl. in [place]? --    No data found.  Updated Vital Signs BP 123/73 (BP Location: Left Arm)   Pulse (!) 105   Temp 98 F (36.7 C) (Oral)   Resp (!) 22   SpO2 95%   Visual Acuity Right Eye Distance:   Left Eye Distance:   Bilateral Distance:    Right Eye Near:   Left Eye Near:    Bilateral Near:     Physical Exam Vitals signs and nursing note reviewed.  Constitutional:      Appearance: He is well-developed.  HENT:     Head: Normocephalic and atraumatic.     Mouth/Throat:     Comments:  Oral mucosa pink and moist, no tonsillar enlargement or exudate. Posterior pharynx patent and nonerythematous, no uvula deviation or swelling. Normal phonation. Eyes:     Conjunctiva/sclera: Conjunctivae normal.     Pupils: Pupils are equal, round, and reactive to light.  Neck:     Musculoskeletal: Neck supple.  Cardiovascular:     Rate and Rhythm: Normal rate and regular rhythm.     Heart sounds: No murmur.  Pulmonary:  Effort: Pulmonary effort is normal. No respiratory distress.     Breath sounds: Wheezing present.     Comments: Breath sounds slightly distant, expiratory wheezing heard diffusely throughout lung fields Abdominal:     Palpations: Abdomen is soft.     Tenderness: There is no abdominal tenderness.     Comments: Nontender to palpation throughout entire abdomen, no palpable masses  Musculoskeletal:     Comments: Anterior chest nontender to palpation  Skin:    General: Skin is warm and dry.  Neurological:     General: No focal deficit present.     Mental Status: He is alert and oriented to person, place, and time. Mental status is at baseline.     Comments: Speech clear, face symmetric      UC Treatments / Results  Labs (all labs ordered are listed, but only abnormal results are displayed) Labs Reviewed - No data to display  EKG   Radiology No results found.  Procedures Procedures (including critical care time)  Medications Ordered in UC Medications - No data to display  Initial Impression / Assessment and Plan / UC Course  I have reviewed the triage vital signs and the nursing notes.  Pertinent labs & imaging results that were available during my care of the patient were reviewed by me and considered in my medical decision making (see chart for details).     EKG normal sinus rhythm today, does have a PAC and PVC present.  Appears relatively stable from previous EKG on 5/29 at 16-34.  No acute signs of ischemia or infarction.  Lungs do have  wheezing, but feels at baseline and continued to improve since exacerbation 1 month ago.  Not currently with chest pain or increased shortness of breath.  Likely stable angina at this time.  Patient likely would benefit from outpatient stress test and follow-up with cardiology.  Advised if chest pain returns to follow-up in the emergency room immediately.  Did provide with a shorter 3-day course of prednisone for wheezing/shortness of breath.  Advised that it is not desirable for him to continue to be on courses  prednisone.   Advised to follow-up with pulmonology for further evaluation of his daily maintenance therapy.Discussed strict return precautions. Patient verbalized understanding and is agreeable with plan.  Final Clinical Impressions(s) / UC Diagnoses   Final diagnoses:  Chest pain, unspecified type     Discharge Instructions     Go to emergency room if chest pain returning Your EKG does not have any new changes today May begin prednisone 50 mg daily for 3 days for wheezing/shortness of breath Please follow up with cardiology as you may need a stress test If shortness of breath persisting follow up with pulmonologist   ED Prescriptions    Medication Sig Dispense Auth. Provider   predniSONE (DELTASONE) 50 MG tablet Take 50 mg daily for 3 days with food 3 tablet Lesleyann Fichter C, PA-C     Controlled Substance Prescriptions [place] Controlled Substance Registry consulted? Not Applicable   Janith [name], PA-C 07/13/18 1357    Layliana Devins, [place] C, PA-C 07/13/18 1400

## 2018-07-13 NOTE — Discharge Instructions (Signed)
Go to emergency room if chest pain returning Your EKG does not have any new changes today May begin prednisone 50 mg daily for 3 days for wheezing/shortness of breath Please follow up with cardiology as you may need a stress test If shortness of breath persisting follow up with pulmonologist

## 2018-07-13 NOTE — ED Notes (Signed)
Patient able to ambulate independently  

## 2018-07-13 NOTE — ED Triage Notes (Signed)
Pt presents to [facility] for assessment of left sided chest pain that happened this morning, lasted for approx 2-3 minutes.  Denies any pain at this time.  Denies worsening of his normal SOB.  Pt was driving, going to work, when it occurred.

## 2018-07-17 ENCOUNTER — Telehealth: Payer: Self-pay

## 2018-07-17 NOTE — Telephone Encounter (Signed)
Ok mask and avoid pt waiting in the waiting area. Known COPD

## 2018-07-17 NOTE — Telephone Encounter (Signed)
Called patient to do their pre-visit COVID screening.  Have you been tested for COVID or are you currently waiting for COVID test results? no  Have you recently traveled internationally([place], [place], [place], [place], [place]) or within the [place] to a hotspot area([place], [place], [place], [place], [place])? no  Are you currently experiencing any of the following: fever, cough, SHOB, fatigue, body aches, loss of smell, rash, diarrhea, vomiting, severe headaches, weakness, sore throat? Patient has c/o SHOB, cough  Have you been in contact with anyone who has recently travelled? no  Have you been in contact with anyone who is experiencing any of the above symptoms or been diagnosed with [place]  or works in or has recently visited a SNF? no

## 2018-07-18 ENCOUNTER — Ambulatory Visit: Payer: Self-pay

## 2018-07-18 ENCOUNTER — Other Ambulatory Visit: Payer: Self-pay

## 2018-07-18 ENCOUNTER — Telehealth: Payer: Self-pay | Admitting: Family Medicine

## 2018-07-18 ENCOUNTER — Ambulatory Visit (INDEPENDENT_AMBULATORY_CARE_PROVIDER_SITE_OTHER): Payer: Self-pay

## 2018-07-18 ENCOUNTER — Ambulatory Visit (INDEPENDENT_AMBULATORY_CARE_PROVIDER_SITE_OTHER): Payer: Self-pay | Admitting: Family Medicine

## 2018-07-18 ENCOUNTER — Encounter: Payer: Self-pay | Admitting: Family Medicine

## 2018-07-18 VITALS — BP 117/75 | HR 87 | Temp 98.1°F | Resp 17 | Ht 67.0 in | Wt 188.4 lb

## 2018-07-18 DIAGNOSIS — J449 Chronic obstructive pulmonary disease, unspecified: Secondary | ICD-10-CM

## 2018-07-18 DIAGNOSIS — R0602 Shortness of breath: Secondary | ICD-10-CM

## 2018-07-18 DIAGNOSIS — R079 Chest pain, unspecified: Secondary | ICD-10-CM

## 2018-07-18 DIAGNOSIS — J189 Pneumonia, unspecified organism: Secondary | ICD-10-CM

## 2018-07-18 DIAGNOSIS — I10 Essential (primary) hypertension: Secondary | ICD-10-CM

## 2018-07-18 MED ORDER — PREDNISONE 20 MG PO TABS: 40.0000 mg | ORAL_TABLET | Freq: Every day | ORAL | 0 refills | Status: AC

## 2018-07-18 MED ORDER — METHYLPREDNISOLONE SODIUM SUCC 125 MG IJ SOLR: 125.0000 mg | Freq: Once | INTRAMUSCULAR | Status: DC

## 2018-07-18 MED ORDER — MOMETASONE FURO-FORMOTEROL FUM 200-5 MCG/ACT IN AERO: 2.0000 | INHALATION_SPRAY | Freq: Two times a day (BID) | RESPIRATORY_TRACT | 2 refills | Status: DC

## 2018-07-18 MED ORDER — METHYLPREDNISOLONE SODIUM SUCC 125 MG IJ SOLR
125.0000 mg | Freq: Once | INTRAMUSCULAR | Status: AC
  Administered 2018-07-18: 125 mg via INTRAMUSCULAR

## 2018-07-18 MED ORDER — LEVOFLOXACIN 500 MG PO TABS: 500.0000 mg | ORAL_TABLET | Freq: Every day | ORAL | 0 refills | Status: DC

## 2018-07-18 MED ORDER — FOLIC ACID 1 MG PO TABS: 1.0000 mg | ORAL_TABLET | Freq: Every day | ORAL | 0 refills | Status: DC

## 2018-07-18 MED FILL — DULERA 200 MCG/5 MCG INH: 200-5 | 20 days supply | Qty: 13 | Fill #0

## 2018-07-18 MED FILL — predniSONE 20 MG TABS: 20 | 5 days supply | Qty: 10 | Fill #0

## 2018-07-18 MED FILL — FOLIC ACID 1 MG TABS: 1 | 30 days supply | Qty: 30 | Fill #0

## 2018-07-18 MED FILL — levoFLOXacin 500 MG TABS: 500 | 7 days supply | Qty: 7 | Fill #0

## 2018-07-18 NOTE — Telephone Encounter (Signed)
Left voice mail to call back 

## 2018-07-18 NOTE — Progress Notes (Signed)
Patient ID: James [name], male    DOB: Oct 17, 1960, 58 y.o.   MRN: [id number]  PCP: Scot Jun, FNP  Chief Complaint  Patient presents with  . Hypertension  . COPD    Subjective:  HPI James WILLIARD is a 58 y.o. male presents for evaluation shortness of breath secondary to COPD.   James [name] has Acute on chronic respiratory failure with hypoxia ([place]); COPD mixed type ([place]); Pneumonia; History of tobacco abuse; Alcohol abuse; Elevated liver enzymes; Low back pain radiating to both legs; Hypertension; COPD exacerbation ([place]); CAP (community acquired pneumonia); and Tick bite on their problem list.   James [name] recently presented to urgent care last week with a complaint of chest pain in shortness of breath.  [place] is s/p  community-acquired pneumonia in which he required hospitalizations due to complications associated with respiratory failure secondary to COPD.  He was hospitalized almost consecutively 05/17/2018 discharged and readmitted on 06/02/2018.  He reports today that since seen was diagnosed with pneumonia he has not recovered back to his baseline of breathing.  He was last seen here in office on 06/13/2018 after a telemedicine visit in which he had severe shortness of breath and wheezing audible during the call.  He was placed on an antibiotic and a round of prednisone and asked to follow back up in the office.  He was seen in office on 06/20/2018 and underwent a chest x-ray.  Chest x-ray at that time was significant for improving patchy right-sided lower lobe lung pneumonia although had not completely resolved.  During that visit patient had been taking his prednisone inappropriately and had taken a 9-day taper in less than a week. James [name] has struggled with appropriate medication adherence as he is unaware of what his medications are used for and when to take his inhalers.  He continues to use his nebulizer machine only once at bedtime opposed to as prescribed every 6 hours.  He endorses  that he is using his rescue inhaler however after further review of the inhaler does in his pocket is an old inhaler from a prior hospital admission.  He has an old prescription of Atrovent and does not have his emergency inhaler with him.  He reports he had been using the Atrovent every 4 hours for shortness of breath.  He reports he has the red albuterol inhaler at home however he does not use that inhaler as a rapid acting inhaler.  He has his Incruse and verbalize correctly the dosage and frequency and strength that inhaler.  He has multiple empty Dulera inhalers and appeared not to know that he was only to administer 2 puffs twice daily for the [facility].  He continues to endorse a productive cough which is clear to yellow sputum production, shortness of breath, and wheezing.  He denies any persistent cough, headache, fever, body aches or new weakness.  He has continued to go to work every day in which he wears a respirator as he works on [employer], operational.  Chest pain James [name] reports a history of longstanding chest pain mostly localized to the center right side of the chest.  He characterizes the pain as a squeezing and pressure-like sensation when it occurs.  He endorses that it occurs at least about every other day and he grew concerned the other day as it seemed to last longer than it had previously.  He has a new diagnosis of hypertension as he was started on blood pressure medicines earlier this year.  He also  has prediabetes which was also a new diagnosis as of this year.  He has a long history of smoking which also increases his risk for cardiovascular disease.  He denies any syncopal-like symptoms, radiation of pain into his left arm, weakness, or headaches.  He has had an abnormal EKG recently during his last admission however this was not addressed during discharge. Most recent EKG revealed Sinus Tachycardia, Atrial Premature complex, and ST elevation (06/05/18). No prior cardiology work-up. Social History    Socioeconomic History  . Marital status: Divorced    Spouse name: Not on file  . Number of children: Not on file  . Years of education: Not on file  . Highest education level: Not on file  Occupational History  . Occupation: Dealer  . Occupation: [employer]  . Financial resource strain: Not on file  . Food insecurity    Worry: Not on file    Inability: Not on file  . Transportation needs    Medical: Not on file    Non-medical: Not on file  Tobacco Use  . Smoking status: Former Smoker    Packs/day: 1.50    Years: 40.00    Pack years: 60.00    Types: Cigarettes  . Smokeless tobacco: Current User    Types: Chew  Substance and Sexual Activity  . Alcohol use: Yes    Alcohol/week: 21.0 standard drinks    Types: 21 Cans of beer per week  . Drug use: Yes    Types: Marijuana    Comment: daily   . Sexual activity: Not Currently    Partners: Female  Lifestyle  . Physical activity    Days per week: Not on file    Minutes per session: Not on file  . Stress: Not on file  Relationships  . Social Herbalist on phone: Not on file    Gets together: Not on file    Attends religious service: Not on file    Active member of club or organization: Not on file    Attends meetings of clubs or organizations: Not on file    Relationship status: Not on file  . Intimate partner violence    Fear of current or ex partner: Not on file    Emotionally abused: Not on file    Physically abused: Not on file    Forced sexual activity: Not on file  Other Topics Concern  . Not on file  Social History Narrative   Lives alone near [place]    Family History  Problem Relation Age of Onset  . Heart disease Father   . COPD Sister      Review of Systems Pertinent negatives listed in HPI No Known Allergies  Prior to Admission medications   Medication Sig Start Date End Date Taking? Authorizing Provider  albuterol (PROVENTIL HFA;VENTOLIN HFA) 108 (90 Base) MCG/ACT  inhaler Inhale 1-2 puffs into the lungs every 6 (six) hours as needed for wheezing or shortness of breath. 03/29/18  Yes Scot Jun, FNP  albuterol (PROVENTIL) (2.5 MG/3ML) 0.083% nebulizer solution Take 3 mLs (2.5 mg total) by nebulization every 6 (six) hours as needed for wheezing or shortness of breath. 06/20/18  Yes Scot Jun, FNP  losartan (COZAAR) 100 MG tablet Take 1 tablet (100 mg total) by mouth daily. 04/27/18  Yes [name], MD  mometasone-formoterol (DULERA) 200-5 MCG/ACT AERO Inhale 2 puffs into the lungs 2 (two) times daily. 03/29/18  Yes Scot Jun,  FNP  Respiratory Therapy Supplies (FLUTTER) DEVI Use 4 times daily 04/27/18  Yes [name], MD  umeclidinium bromide (INCRUSE ELLIPTA) 62.5 MCG/INH AEPB Inhale 1 puff into the lungs daily. 03/29/18  Yes Scot Jun, FNP  folic acid (FOLVITE) 1 MG tablet Take 1 tablet (1 mg total) by mouth daily. Patient not taking: Reported on 07/18/2018 03/30/18   Scot Jun, FNP    Past Medical, Surgical Family and Social History reviewed and updated.    Objective:   Today's Vitals   07/18/18 1007  BP: 117/75  Pulse: 87  Resp: 17  Temp: 98.1 F (36.7 C)  TempSrc: Temporal  SpO2: 93%  Weight: 188 lb 6.4 oz (85.5 kg)  Height: 5\' 7"  (1.702 m)    BP Readings from Last 3 Encounters:  07/18/18 117/75  07/13/18 123/73  06/20/18 (!) 148/90    Filed Weights   07/18/18 1007  Weight: 188 lb 6.4 oz (85.5 kg)       Physical Exam  General appearance: alert, well developed, well nourished, cooperative and in no distress Head: Normocephalic, without obvious abnormality, atraumatic Respiratory: positive wheezing, positive decreased lung expansion Heart: rate and rhythm normal. No gallop or murmurs noted on exam  Abdomen: BS +, no distention, no rebound tenderness, or no mass Extremities: No gross deformities Skin: Skin color, texture, turgor normal. No rashes seen  Psych: Appropriate mood and  affect. Neurologic: Mental status: Alert, oriented to person, place, and time, thought content appropriate. Lab Results  Component Value Date   HGBA1C 6.3 (H) 06/20/2018     Assessment & Plan:  1. Chest pain, unspecified type Patient has multiple risk factors for cardiovascular disease will place a referral for cardiology work-up, given recurrent chest pain - Ambulatory referral to Cardiology  2. Chronic obstructive pulmonary disease, unspecified COPD type ([place]) See #3 and #4 - DG Chest 2 View; Future - methylPREDNISolone sodium succinate (SOLU-MEDROL) 125 mg/2 mL injection 125 mg  3. Pneumonia due to infectious organism, unspecified laterality, unspecified part of lung Per chest x-ray today patient has a new finding concerning for left medial lobe pneumonia.  Will start on Levaquin 500 mg x 7 days.  Patient is following up next week with pulmonologist [name]. - methylPREDNISolone sodium succinate (SOLU-MEDROL) 125 mg/2 mL injection 125 mg  4. Shortness of breath Obtaining a repeat chest x-ray as respiratory exam today is grossly abnormal.  Patient has refused wheezing throughout the lung bases. Administer Solu-Medrol 125 mg 1 time here in office IM Start following a 5-day course of oral prednisone starting tomorrow with 40 mg daily x5 days  Dg Chest 2 View Result Date: 07/18/2018 CLINICAL DATA:  Recurring pneumonia EXAM: CHEST - 2 VIEW COMPARISON:  June 20, 2018 FINDINGS: The heart size and mediastinal contours are within normal limits. Coarsened increased pulmonary markings are identified in both lungs. Mild hazy opacity of the right lung base is unchanged. There is interval developed patchy opacity of the medial left lung base. The visualized skeletal structures are stable. IMPRESSION: Stable mild hazy opacity of right lung base and interval developed patchy opacity of medial left lung base suspicious for pneumonia. Electronically Signed   By: Abelardo Diesel M.D.   On: 07/18/2018  10:58   Patient left prior to chest x-ray being.   However will start him on Levaquin 500 mg x 7 days as there appears to be a new pneumonia of the left lung medially.  He will continue the prednisone as prescribed.  He is following up with pulmonologist [name] in office next week.  Reinforced with patient multiple times the importance of appropriate medication adherence to reduce the risk of recurrent COPD exacerbations and recurrent pneumonia.  See phone message for specifics.    Molli Barrows, FNP Primary Care at [facility] [address] [place] [phone]: [phone]

## 2018-07-18 NOTE — Telephone Encounter (Signed)
Notify [name] that he has a new pneumonia that has developed on the left side of his lung.  I am starting him on Levaquin 500 mg once daily for 7 days.  He is to take medication daily along with recently prescribed prednisone.  Advised him if he experiences worsening shortness of breath or fatigue or fever he needs to go immediately to the ER otherwise keep appointment with [name] for next week.

## 2018-07-18 NOTE — Patient Instructions (Signed)
James [name] as discussed please use your inhalers as follows The albuterol (direct 1) is a rescue inhaler which you should use every 4 hours as needed for shortness of breath, wheezing, uncontrollable cough.  This medication will help you breathe better more quickly.  I recommend that you use your nebulizer machine at minimum twice daily once in the morning and once at bedtime as you are currently doing.  Using your nebulizer machine more frequently will help also facilitate improved work of breathing.  Continue to use your Incruse once daily as prescribed.  The [facility] is 2 puffs in the morning and 2 puffs in the evening.  Appropriate use of your inhaled therapy will help improve your overall symptoms of your COPD.  I have referred you to cardiology for further evaluation of chest pain as I feel this warrants further work-up to rule out any underlying heart disease.

## 2018-07-18 NOTE — Progress Notes (Unsigned)
Patient ID: James [name], male    DOB: [birth year], 58 y.o.   MRN: [id number]  PCP: Scot Jun, FNP    Subjective:  HPI  James [name] is a 58 y.o. male presents for evaluation   has Acute on chronic respiratory failure with hypoxia ([place]); COPD mixed type ([name]); Pneumonia; History of tobacco abuse; Alcohol abuse; Elevated liver enzymes; Low back pain radiating to both legs; Hypertension; COPD exacerbation ([place]); CAP (community acquired pneumonia); and Tick bite on their problem list.   Today's visit:  Social History   Socioeconomic History  . Marital status: Divorced    Spouse name: Not on file  . Number of children: Not on file  . Years of education: Not on file  . Highest education level: Not on file  Occupational History  . Occupation: Dealer  . Occupation: [employer]  . Financial resource strain: Not on file  . Food insecurity    Worry: Not on file    Inability: Not on file  . Transportation needs    Medical: Not on file    Non-medical: Not on file  Tobacco Use  . Smoking status: Former Smoker    Packs/day: 1.50    Years: 40.00    Pack years: 60.00    Types: Cigarettes  . Smokeless tobacco: Current User    Types: Chew  Substance and Sexual Activity  . Alcohol use: Yes    Alcohol/week: 21.0 standard drinks    Types: 21 Cans of beer per week  . Drug use: Yes    Types: Marijuana    Comment: daily   . Sexual activity: Not Currently    Partners: Female  Lifestyle  . Physical activity    Days per week: Not on file    Minutes per session: Not on file  . Stress: Not on file  Relationships  . Social Herbalist on phone: Not on file    Gets together: Not on file    Attends religious service: Not on file    Active member of club or organization: Not on file    Attends meetings of clubs or organizations: Not on file    Relationship status: Not on file  . Intimate partner violence    Fear of current or ex partner: Not on file    Emotionally abused: Not on file    Physically abused: Not on file    Forced sexual activity: Not on file  Other Topics Concern  . Not on file  Social History Narrative   Lives alone near [place]    Family History  Problem Relation Age of Onset  . Heart disease Father   . COPD Sister      Review of Systems  No Known Allergies  Prior to Admission medications   Medication Sig Start Date End Date Taking? Authorizing Provider  albuterol (PROVENTIL HFA;VENTOLIN HFA) 108 (90 Base) MCG/ACT inhaler Inhale 1-2 puffs into the lungs every 6 (six) hours as needed for wheezing or shortness of breath. 03/29/18   Scot Jun, FNP  albuterol (PROVENTIL) (2.5 MG/3ML) 0.083% nebulizer solution Take 3 mLs (2.5 mg total) by nebulization every 6 (six) hours as needed for wheezing or shortness of breath. 06/20/18   Scot Jun, FNP  folic acid (FOLVITE) 1 MG tablet Take 1 tablet (1 mg total) by mouth daily. 07/18/18   Scot Jun, FNP  losartan (COZAAR) 100 MG tablet Take 1 tablet (100 mg total) by mouth  daily. 04/27/18   [name], MD  mometasone-formoterol [facility]) 200-5 MCG/ACT AERO Inhale 2 puffs into the lungs 2 (two) times daily. 07/18/18   Scot Jun, FNP  predniSONE (DELTASONE) 20 MG tablet Take 2 tablets (40 mg total) by mouth daily with breakfast for 5 days. 07/18/18 07/23/18  Scot Jun, FNP  Respiratory Therapy Supplies (FLUTTER) DEVI Use 4 times daily 04/27/18   [name], MD  umeclidinium bromide (INCRUSE ELLIPTA) 62.5 MCG/INH AEPB Inhale 1 puff into the lungs daily. 03/29/18   Scot Jun, FNP    Past Medical, Surgical Family and Social History reviewed and updated.    Objective:  There were no vitals filed for this visit.  BP Readings from Last 3 Encounters:  07/18/18 117/75  07/13/18 123/73  06/20/18 (!) 148/90    There were no vitals filed for this visit.     Physical Exam General appearance: alert, well developed, well  nourished, cooperative and in no distress Head: Normocephalic, without obvious abnormality, atraumatic Respiratory: Respirations even and unlabored, normal respiratory rate Heart: rate and rhythm normal. No gallop or murmurs noted on exam  Extremities: No gross deformities Skin: Skin color, texture, turgor normal. No rashes seen  Psych: Appropriate mood and affect. Neurologic: Mental status: Alert, oriented to person, place, and time, thought content appropriate.  No results found for: POCGLU  Lab Results  Component Value Date   HGBA1C 6.3 (H) 06/20/2018            Assessment & Plan:  1. Chronic obstructive pulmonary disease, unspecified COPD type ([place]) *** - DG Chest 2 View  2. Pneumonia due to infectious organism, unspecified laterality, unspecified part of lung *** - DG Chest 2 View      Molli Barrows, FNP Primary Care at [facility] [address], [place] [phone]: [phone]

## 2018-07-19 NOTE — Telephone Encounter (Signed)
Patient's sister states that she was able to pick up all of patients prescriptions(antibiotic included). She hasn't been able to get patient on the phone. She is aware of patient's appointment with [name] on 07/25/2018.

## 2018-07-19 NOTE — Telephone Encounter (Signed)
Please notify patient's emergency contact if he still unable to be reached today- who is his sister that patient has an antibiotic that needs to be picked up and started immediately.  Also advised that he is scheduled for follow-up next week with [name].  He needs to start antibiotics prior to this appointment in order to be evaluated for improved symptoms.

## 2018-07-20 ENCOUNTER — Telehealth: Payer: Self-pay

## 2018-07-20 NOTE — Telephone Encounter (Signed)
Good Morning,  I'm not sure where exactly this should go so I sent the message to both of you guys.  I spoke with patient's sister yesterday evening about his medications & she mentioned some concerns that she was having affording his medications. He has been give the financial packet & most of it has been completed but she states that she is having trouble proving finances. James [name] does not work, he was "hired" by a man who let's him stay on his property. The man has written a letter stating such.  His sister also mentioned that she is having trouble as far as identification, James [name] doesn't have a driver's license, birth certificate, social security card. She wants to know how to go about getting something to prove his identity so that they can turn in the packet.

## 2018-07-24 ENCOUNTER — Telehealth: Payer: Self-pay

## 2018-07-24 NOTE — Progress Notes (Signed)
Subjective:    Patient ID: James [name], male    DOB: [birth year], 58 y.o.   MRN: [id number]  58 y.o.M with Copd last seen by way of a telephone visit in April.  This is a follow-up primary care and pulmonary visit for this patient.  Is a 58 year old male with advanced chronic obstructive lung disease diagnosed about a year ago.  The patient's had multiple emergency room visits and hospitalization in February 2020 for COPD exacerbation and 2 more hospitalizations in May.  The patient's had no further smoking over the past several years.  Note this patient has been on multiple rounds of prednisone and Augmentin and then more recently azithromycin and then Levaquin.  The patient has had both right and left lower lobe pneumonia seen in May and again in June.  A recent chest x-ray July 16 showed persistent infiltrate in the lower lobe.  Is on albuterol by nebulizer but only takes this daily.  The patient also is on Dulera 2 inhalations twice daily and Incruse 1 puff daily though he has run out of the Incruse.  1 of his challenges is not being compliant with his inhaled medications.  Another challenges that he lives on the property of his employer who helps to support him financially.  Living conditions are uncertain in the home.  The patient sister is also been attempting to assist with his care.  I have recommended a flutter valve but he is yet to pick this up.  The patient is not on oxygen therapy and so far does not demonstrate hypoxemia chronically.  Patient also has history of alcohol abuse and is still drinking on a regular basis. Note the patient had 2 COVID test in May which were negative during the hospitalization  The patient has also been following a [place] [facility] pulmonary last seen in May as well of this year  This patient also has had chest pain with radiation down the right arm with abnormalities on EKG including ST-T wave changes.  The patient is never had coronary arteries diagnosed.  There is  a outstanding cardiology consultation that she had to be accomplished  Below is a note from primary care visit July 16 Janziel recently presented to urgent care last week with a complaint of chest pain in shortness of breath.  Shantel is s/p  community-acquired pneumonia in which he required hospitalizations due to complications associated with respiratory failure secondary to COPD.  He was hospitalized almost consecutively 05/17/2018 discharged and readmitted on 06/02/2018.  He reports today that since seen was diagnosed with pneumonia he has not recovered back to his baseline of breathing.  He was last seen here in office on 06/13/2018 after a telemedicine visit in which he had severe shortness of breath and wheezing audible during the call.  He was placed on an antibiotic and a round of prednisone and asked to follow back up in the office.  He was seen in office on 06/20/2018 and underwent a chest x-ray.  Chest x-ray at that time was significant for improving patchy right-sided lower lobe lung pneumonia although had not completely resolved.  During that visit patient had been taking his prednisone inappropriately and had taken a 9-day taper in less than a week. Seeley has struggled with appropriate medication adherence as he is unaware of what his medications are used for and when to take his inhalers.  He continues to use his nebulizer machine only once at bedtime opposed to as prescribed every 6 hours.  He endorses that he is using his rescue inhaler however after further review of the inhaler does in his pocket is an old inhaler from a prior hospital admission.  He has an old prescription of Atrovent and does not have his emergency inhaler with him.  He reports he had been using the Atrovent every 4 hours for shortness of breath.  He reports he has the red albuterol inhaler at home however he does not use that inhaler as a rapid acting inhaler.  He has his Incruse and verbalize correctly the dosage and frequency and  strength that inhaler.  He has multiple empty Dulera inhalers and appeared not to know that he was only to administer 2 puffs twice daily for the [facility].  He continues to endorse a productive cough which is clear to yellow sputum production, shortness of breath, and wheezing.  He denies any persistent cough, headache, fever, body aches or new weakness.  He has continued to go to work every day in which he wears a respirator as he works on [employer], operational.  Chest pain Kiara reports a history of longstanding chest pain mostly localized to the center right side of the chest.  He characterizes the pain as a squeezing and pressure-like sensation when it occurs.  He endorses that it occurs at least about every other day and he grew concerned the other day as it seemed to last longer than it had previously.  He has a new diagnosis of hypertension as he was started on blood pressure medicines earlier this year.  He also has prediabetes which was also a new diagnosis as of this year.  He has a long history of smoking which also increases his risk for cardiovascular disease.  He denies any syncopal-like symptoms, radiation of pain into his left arm, weakness, or headaches.  He has had an abnormal EKG recently during his last admission however this was not addressed during discharge. Most recent EKG revealed Sinus Tachycardia, Atrial Premature complex, and ST elevation (06/05/18). No prior cardiology work-up.  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  Note he is no longer smoking tobacco.  He states prednisone has been beneficial in the past.    Shortness of Breath This is a chronic problem. The current episode started more than 1 year ago. The problem occurs daily (qhs shortness of breath). The problem has been gradually worsening. Associated symptoms include chest pain, PND and wheezing. Pertinent negatives include no claudication, fever,  headaches, hemoptysis, leg pain, leg swelling, orthopnea, rash, sore throat, sputum production, syncope or vomiting. The symptoms are aggravated by any activity and weather changes. Associated symptoms comments: Pain radiates to arm Legs go to sleep No dysphagia No gerd . Risk factors include smoking. He has tried beta agonist inhalers, oral steroids and steroid inhalers for the symptoms. The treatment provided moderate relief. His past medical history is significant for COPD and pneumonia. There is no history of asthma, CAD, DVT, a heart failure or PE.    Past Medical History:  Diagnosis Date  . COPD (chronic obstructive pulmonary disease) ([place])   . Pneumonia 04/06/2016  . Pneumothorax    2016, fell from ladder  . Tick bite 06/03/2018     Family History  Problem Relation Age of Onset  . Heart disease Father   . COPD Sister      Social History   Socioeconomic History  . Marital status: Divorced  Spouse name: Not on file  . Number of children: Not on file  . Years of education: Not on file  . Highest education level: Not on file  Occupational History  . Occupation: Dealer  . Occupation: [employer]  . Financial resource strain: Not on file  . Food insecurity    Worry: Not on file    Inability: Not on file  . Transportation needs    Medical: Not on file    Non-medical: Not on file  Tobacco Use  . Smoking status: Former Smoker    Packs/day: 1.50    Years: 40.00    Pack years: 60.00    Types: Cigarettes  . Smokeless tobacco: Current User    Types: Chew  Substance and Sexual Activity  . Alcohol use: Yes    Alcohol/week: 21.0 standard drinks    Types: 21 Cans of beer per week  . Drug use: Yes    Types: Marijuana    Comment: daily   . Sexual activity: Not Currently    Partners: Female  Lifestyle  . Physical activity    Days per week: Not on file    Minutes per session: Not on file  . Stress: Not on file  Relationships  . Social Herbalist  on phone: Not on file    Gets together: Not on file    Attends religious service: Not on file    Active member of club or organization: Not on file    Attends meetings of clubs or organizations: Not on file    Relationship status: Not on file  . Intimate partner violence    Fear of current or ex partner: Not on file    Emotionally abused: Not on file    Physically abused: Not on file    Forced sexual activity: Not on file  Other Topics Concern  . Not on file  Social History Narrative   Lives alone near [place]     No Known Allergies   Outpatient Medications Prior to Visit  Medication Sig Dispense Refill  . albuterol (PROVENTIL HFA;VENTOLIN HFA) 108 (90 Base) MCG/ACT inhaler Inhale 1-2 puffs into the lungs every 6 (six) hours as needed for wheezing or shortness of breath. 1 Inhaler 5  . albuterol (PROVENTIL) (2.5 MG/3ML) 0.083% nebulizer solution Take 3 mLs (2.5 mg total) by nebulization every 6 (six) hours as needed for wheezing or shortness of breath. 409 mL 1  . folic acid (FOLVITE) 1 MG tablet Take 1 tablet (1 mg total) by mouth daily. 90 tablet 0  . levofloxacin (LEVAQUIN) 500 MG tablet Take 1 tablet (500 mg total) by mouth daily. 7 tablet 0  . losartan (COZAAR) 100 MG tablet Take 1 tablet (100 mg total) by mouth daily. 30 tablet 3  . mometasone-formoterol (DULERA) 200-5 MCG/ACT AERO Inhale 2 puffs into the lungs 2 (two) times daily. 13 g 2  . pantoprazole (PROTONIX) 40 MG tablet Take 40 mg by mouth daily.    . potassium gluconate 595 (99 K) MG TABS tablet Take 595 mg by mouth 2 (two) times daily.    [name] Respiratory Therapy Supplies (FLUTTER) DEVI Use 4 times daily 1 each 0  . umeclidinium bromide (INCRUSE ELLIPTA) 62.5 MCG/INH AEPB Inhale 1 puff into the lungs daily. 30 each 5   No facility-administered medications prior to visit.      Review of Systems  Constitutional: Negative for fever.  HENT: Negative for sinus pressure, sinus pain, sore throat and  trouble swallowing.    Eyes: Negative.   Respiratory: Positive for shortness of breath and wheezing. Negative for hemoptysis and sputum production.   Cardiovascular: Positive for chest pain and PND. Negative for palpitations, orthopnea, claudication, leg swelling and syncope.  Gastrointestinal: Negative for nausea and vomiting.  Genitourinary: Negative.   Musculoskeletal: Negative.   Skin: Negative for rash.  Neurological: Negative for headaches.  Psychiatric/Behavioral: The patient is not nervous/anxious and is not hyperactive.        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  ENT: No lesions,  mouth clear,  oropharynx clear, no postnasal drip  Neck: No JVD, no TMG, no carotid bruits  Lungs: No use of accessory muscles, no dullness to percussion, distant breath sounds with expiratory wheezes poor airflow Cardiovascular: RRR, heart sounds normal, no murmur or gallops, no peripheral edema  Abdomen: soft and NT, no HSM,  BS normal  Musculoskeletal: No deformities, no cyanosis or clubbing  Neuro: alert, non focal  Skin: Warm, no lesions or rashes  7/14 CXR IMPRESSION: Stable mild hazy opacity of right lung base and interval developed patchy opacity of medial left lung base suspicious for pneumonia.  BMP Latest Ref Rng & Units 06/20/2018 06/03/2018 06/02/2018  Glucose 65 - 99 mg/dL 119(H) 117(H) -  BUN 6 - 24 mg/dL 19 16 -  Creatinine 0.76 - 1.27 mg/dL 0.77 0.81 0.80  BUN/Creat Ratio 9 - 20 25(H) - -  Sodium 134 - 144 mmol/L 142 142 -  Potassium 3.5 - 5.2 mmol/L 4.6 4.0 -  Chloride 96 - 106 mmol/L 102 102 -  CO2 20 - 29 mmol/L 26 27 -   Calcium 8.7 - 10.2 mg/dL 9.5 9.7 -   Hepatic Function Panel     Component Value Date/Time   PROT 6.0 06/20/2018 1159   ALBUMIN 4.1 06/20/2018 1159   AST 34 06/20/2018 1159   ALT 37 06/20/2018 1159   ALT 62 (H) 06/23/2016 1226   ALKPHOS 43 06/20/2018 1159   BILITOT 0.4 06/20/2018 1159   BILIDIR 0.12 04/16/2016 1601   IBILI NOT CALCULATED 03/26/2016 1845   CBC Latest Ref Rng & Units 06/20/2018 06/03/2018 06/02/2018  WBC 3.4 - 10.8 x10E3/uL 11.1(H) 19.9(H) 16.8(H)  Hemoglobin 13.0 - 17.7 g/dL 14.6 13.9 15.4  Hematocrit 37.5 - 51.0 % 41.5 41.5 46.8  Platelets 150 - 450 x10E3/uL 281 184 228        Assessment & Plan:  I personally reviewed all images and lab data in the [facility] system as well as any outside material available during this office visit and agree with the  radiology impressions.   COPD mixed type ([place]) Chronic obstructive lung disease with significant emphysematous and asthmatic bronchitic components Note recent alpha-1 antitrypsin level was normal The patient's use of HFA devices is poor and his use of DPA devices somewhat better but coughs after use  Note the patient currently is out of his Incruse and claims to be using Dulera regularly but all the devices he brought in today were essentially empty except for his albuterol inhaler  Exam shows  persistent airway inflammation and he is yet to complete his course of Levaquin he has 2 more doses  Plan will be for the patient to finish his course of Levaquin and resume prednisone 40 mg a day with a much slower taper down to 10 mg a day and hold at that level  The patient will obtain the flutter valve and use this 4 times daily Will resume Incruse 1 puff daily and continue Dulera 2 inhalations twice daily Mucinex is used as needed  Pneumonia Community-acquired pneumonia left lower lobe and right lower lobe seems to be improving  Continue and finish current course of Levaquin  COPD exacerbation ([place]) Recurrent COPD  exacerbation  Review COPD mixed type assessment  CAP (community acquired pneumonia) Note pneumonia assessment as above  Elevated liver enzymes Only mild elevations in liver function seen at recent values obtained   Jermel was seen today for copd and pneumonia.  Diagnoses and all orders for this visit:  COPD exacerbation ([place])  Colon cancer screening -     Fecal occult blood, imunochemical([facility])  COPD mixed type ([place])  Alcohol abuse  History of tobacco abuse  Essential hypertension  Community acquired pneumonia of right middle lobe of lung ([place])  Pneumonia of both lungs due to infectious organism, unspecified part of lung  Elevated liver enzymes  Other orders -     umeclidinium bromide (INCRUSE ELLIPTA) 62.5 MCG/INH AEPB; Inhale 1 puff into the lungs daily. -     predniSONE (DELTASONE) 10 MG tablet; Take 4 for four days 3 for four days 2 for four days then one daily and stay  Fecal blood Hemoccult was obtained today

## 2018-07-24 NOTE — Telephone Encounter (Signed)
Called patient to do their pre-visit COVID screening.  Call went to voicemail that is full. Unable to do prescreening.

## 2018-07-25 ENCOUNTER — Ambulatory Visit (INDEPENDENT_AMBULATORY_CARE_PROVIDER_SITE_OTHER): Payer: Self-pay | Admitting: Critical Care Medicine

## 2018-07-25 ENCOUNTER — Encounter: Payer: Self-pay | Admitting: Critical Care Medicine

## 2018-07-25 ENCOUNTER — Other Ambulatory Visit: Payer: Self-pay

## 2018-07-25 VITALS — BP 136/83 | HR 87 | Temp 97.5°F | Resp 17 | Ht 67.0 in | Wt 187.0 lb

## 2018-07-25 DIAGNOSIS — F1010 Alcohol abuse, uncomplicated: Secondary | ICD-10-CM

## 2018-07-25 DIAGNOSIS — Z1211 Encounter for screening for malignant neoplasm of colon: Secondary | ICD-10-CM

## 2018-07-25 DIAGNOSIS — Z87891 Personal history of nicotine dependence: Secondary | ICD-10-CM

## 2018-07-25 DIAGNOSIS — I10 Essential (primary) hypertension: Secondary | ICD-10-CM

## 2018-07-25 DIAGNOSIS — J189 Pneumonia, unspecified organism: Secondary | ICD-10-CM

## 2018-07-25 DIAGNOSIS — R748 Abnormal levels of other serum enzymes: Secondary | ICD-10-CM

## 2018-07-25 DIAGNOSIS — J441 Chronic obstructive pulmonary disease with (acute) exacerbation: Secondary | ICD-10-CM

## 2018-07-25 DIAGNOSIS — J181 Lobar pneumonia, unspecified organism: Secondary | ICD-10-CM

## 2018-07-25 DIAGNOSIS — J449 Chronic obstructive pulmonary disease, unspecified: Secondary | ICD-10-CM

## 2018-07-25 MED ORDER — INCRUSE ELLIPTA 62.5 MCG/INH IN AEPB: 1.0000 | INHALATION_SPRAY | Freq: Every day | RESPIRATORY_TRACT | 5 refills | Status: DC

## 2018-07-25 MED ORDER — PREDNISONE 10 MG PO TABS: ORAL_TABLET | ORAL | 0 refills | Status: DC

## 2018-07-25 MED FILL — !INCRUSE ELLIPTA 62.5 MCG I: 62.5 | 30 days supply | Qty: 30 | Fill #0

## 2018-07-25 MED FILL — ?PREDNISONE 10 MG TABLET: 10 | 36 days supply | Qty: 60 | Fill #0

## 2018-07-25 NOTE — Assessment & Plan Note (Signed)
Note pneumonia assessment as above

## 2018-07-25 NOTE — Patient Instructions (Signed)
Resume prednisone and take 4 daily for 4 days then reduce by 1 every 4 days to get to 1 a day and stay at that level  Finish the Levaquin that you are taking  Refill on the Incruse was sent to your pharmacy  Continue Dulera 2 inhalations twice daily  Use your nebulizer in the morning and in the evening  Please stay well-hydrated and give consideration to changing your job to reduce your outdoor exposure to the heat extremes  Use the flutter valve 2-3 times a day to help with secretion removal  Return 2 weeks for follow-up

## 2018-07-25 NOTE — Assessment & Plan Note (Signed)
Only mild elevations in liver function seen at recent values obtained

## 2018-07-25 NOTE — Telephone Encounter (Signed)
Patient's sister [name] notified of this information & given the number to the [facility] as well as the pharmacy at [facility] to discuss the [facility] program to see if any of his medications qualify.

## 2018-07-25 NOTE — Assessment & Plan Note (Signed)
Recurrent COPD exacerbation  Review COPD mixed type assessment

## 2018-07-25 NOTE — Assessment & Plan Note (Signed)
Chronic obstructive lung disease with significant emphysematous and asthmatic bronchitic components Note recent alpha-1 antitrypsin level was normal The patient's use of HFA devices is poor and his use of DPA devices somewhat better but coughs after use  Note the patient currently is out of his Incruse and claims to be using Dulera regularly but all the devices he brought in today were essentially empty except for his albuterol inhaler  Exam shows persistent airway inflammation and he is yet to complete his course of Levaquin he has 2 more doses  Plan will be for the patient to finish his course of Levaquin and resume prednisone 40 mg a day with a much slower taper down to 10 mg a day and hold at that level  The patient will obtain the flutter valve and use this 4 times daily Will resume Incruse 1 puff daily and continue Dulera 2 inhalations twice daily Mucinex is used as needed

## 2018-07-25 NOTE — Assessment & Plan Note (Signed)
Community-acquired pneumonia left lower lobe and right lower lobe seems to be improving  Continue and finish current course of Levaquin

## 2018-07-26 LAB — FECAL OCCULT BLOOD, IMMUNOCHEMICAL: Fecal Occult Bld: NEGATIVE

## 2018-07-27 NOTE — Progress Notes (Signed)
Patient's sister notified of results & recommendations. Expressed understanding. Per request a lab letter was mailed as well.

## 2018-07-31 ENCOUNTER — Ambulatory Visit: Payer: Self-pay | Admitting: Family Medicine

## 2018-07-31 ENCOUNTER — Inpatient Hospital Stay (HOSPITAL_COMMUNITY)
Admission: EM | Admit: 2018-07-31 | Discharge: 2018-08-01 | DRG: 190 | Disposition: A | Discharge status: Home / Self Care | Payer: Self-pay | Attending: Internal Medicine | Admitting: Internal Medicine

## 2018-07-31 ENCOUNTER — Other Ambulatory Visit: Payer: Self-pay

## 2018-07-31 ENCOUNTER — Encounter (HOSPITAL_COMMUNITY): Payer: Self-pay | Admitting: Oncology

## 2018-07-31 ENCOUNTER — Emergency Department (HOSPITAL_COMMUNITY): Payer: Self-pay

## 2018-07-31 DIAGNOSIS — J440 Chronic obstructive pulmonary disease with acute lower respiratory infection: Secondary | ICD-10-CM | POA: Diagnosis present

## 2018-07-31 DIAGNOSIS — I10 Essential (primary) hypertension: Secondary | ICD-10-CM | POA: Diagnosis present

## 2018-07-31 DIAGNOSIS — Z825 Family history of asthma and other chronic lower respiratory diseases: Secondary | ICD-10-CM

## 2018-07-31 DIAGNOSIS — J189 Pneumonia, unspecified organism: Secondary | ICD-10-CM | POA: Diagnosis present

## 2018-07-31 DIAGNOSIS — J441 Chronic obstructive pulmonary disease with (acute) exacerbation: Principal | ICD-10-CM | POA: Diagnosis present

## 2018-07-31 DIAGNOSIS — Z20828 Contact with and (suspected) exposure to other viral communicable diseases: Secondary | ICD-10-CM | POA: Diagnosis present

## 2018-07-31 DIAGNOSIS — Z87891 Personal history of nicotine dependence: Secondary | ICD-10-CM

## 2018-07-31 DIAGNOSIS — J9620 Acute and chronic respiratory failure, unspecified whether with hypoxia or hypercapnia: Secondary | ICD-10-CM | POA: Diagnosis present

## 2018-07-31 DIAGNOSIS — J9621 Acute and chronic respiratory failure with hypoxia: Secondary | ICD-10-CM | POA: Diagnosis present

## 2018-07-31 DIAGNOSIS — Z8249 Family history of ischemic heart disease and other diseases of the circulatory system: Secondary | ICD-10-CM

## 2018-07-31 LAB — POCT I-STAT 7, (LYTES, BLD GAS, ICA,H+H)
Bicarbonate: 25.7 mmol/L (ref 20.0–28.0)
Calcium, Ion: 1.24 mmol/L (ref 1.15–1.40)
HCT: 40 % (ref 39.0–52.0)
Hemoglobin: 13.6 g/dL (ref 13.0–17.0)
O2 Saturation: 100 %
Patient temperature: 98.6
Potassium: 3.3 mmol/L — ABNORMAL LOW (ref 3.5–5.1)
Sodium: 138 mmol/L (ref 135–145)
TCO2: 27 mmol/L (ref 22–32)
pCO2 arterial: 43.2 mmHg (ref 32.0–48.0)
pH, Arterial: 7.382 (ref 7.350–7.450)
pO2, Arterial: 235 mmHg — ABNORMAL HIGH (ref 83.0–108.0)

## 2018-07-31 LAB — CBC WITH DIFFERENTIAL/PLATELET
Abs Immature Granulocytes: 0.1 10*3/uL — ABNORMAL HIGH (ref 0.00–0.07)
Basophils Absolute: 0 10*3/uL (ref 0.0–0.1)
Basophils Relative: 0 %
Eosinophils Absolute: 0 10*3/uL (ref 0.0–0.5)
Eosinophils Relative: 0 %
HCT: 41.6 % (ref 39.0–52.0)
Hemoglobin: 13.9 g/dL (ref 13.0–17.0)
Immature Granulocytes: 1 %
Lymphocytes Relative: 13 %
Lymphs Abs: 2.6 10*3/uL (ref 0.7–4.0)
MCH: 32.9 pg (ref 26.0–34.0)
MCHC: 33.4 g/dL (ref 30.0–36.0)
MCV: 98.6 fL (ref 80.0–100.0)
Monocytes Absolute: 0.8 10*3/uL (ref 0.1–1.0)
Monocytes Relative: 4 %
Neutro Abs: 16.2 10*3/uL — ABNORMAL HIGH (ref 1.7–7.7)
Neutrophils Relative %: 82 %
Platelets: 240 10*3/uL (ref 150–400)
RBC: 4.22 MIL/uL (ref 4.22–5.81)
RDW: 12.7 % (ref 11.5–15.5)
WBC: 19.8 10*3/uL — ABNORMAL HIGH (ref 4.0–10.5)
nRBC: 0 % (ref 0.0–0.2)

## 2018-07-31 LAB — BASIC METABOLIC PANEL
Anion gap: 14 (ref 5–15)
BUN: 9 mg/dL (ref 6–20)
CO2: 23 mmol/L (ref 22–32)
Calcium: 9 mg/dL (ref 8.9–10.3)
Chloride: 102 mmol/L (ref 98–111)
Creatinine, Ser: 0.76 mg/dL (ref 0.61–1.24)
GFR calc Af Amer: >60 mL/min (ref >60)
GFR calc non Af Amer: >60 mL/min (ref >60)
Glucose, Bld: 221 mg/dL — ABNORMAL HIGH (ref 70–99)
Potassium: 3.5 mmol/L (ref 3.5–5.1)
Sodium: 139 mmol/L (ref 135–145)

## 2018-07-31 LAB — TROPONIN I (HIGH SENSITIVITY)
Troponin I (High Sensitivity): 8 ng/L (ref <18)
Troponin I (High Sensitivity): 11 ng/L (ref <18)

## 2018-07-31 LAB — SARS CORONAVIRUS 2 BY RT PCR (HOSPITAL ORDER, PERFORMED IN ~~LOC~~ HOSPITAL LAB): SARS Coronavirus 2: NEGATIVE

## 2018-07-31 LAB — BRAIN NATRIURETIC PEPTIDE: B Natriuretic Peptide: 71 pg/mL (ref 0.0–100.0)

## 2018-07-31 LAB — HIV ANTIBODY (ROUTINE TESTING W REFLEX): HIV Screen 4th Generation wRfx: NONREACTIVE

## 2018-07-31 MED ORDER — SODIUM CHLORIDE 0.9 % IV SOLN
1.0000 g | INTRAVENOUS | Status: DC
  Administered 2018-08-01: 1 g via INTRAVENOUS
  Filled 2018-07-31: qty 10

## 2018-07-31 MED ORDER — ALBUTEROL (5 MG/ML) CONTINUOUS INHALATION SOLN
10.0000 mg/h | INHALATION_SOLUTION | Freq: Once | RESPIRATORY_TRACT | Status: AC
  Administered 2018-07-31: 10 mg/h via RESPIRATORY_TRACT
  Filled 2018-07-31: qty 20

## 2018-07-31 MED ORDER — SODIUM CHLORIDE 0.9 % IV SOLN
500.0000 mg | Freq: Once | INTRAVENOUS | Status: AC
  Administered 2018-07-31: 500 mg via INTRAVENOUS
  Filled 2018-07-31: qty 500

## 2018-07-31 MED ORDER — POTASSIUM CHLORIDE CRYS ER 20 MEQ PO TBCR
40.0000 meq | EXTENDED_RELEASE_TABLET | Freq: Once | ORAL | Status: AC
  Administered 2018-07-31: 40 meq via ORAL
  Filled 2018-07-31: qty 2

## 2018-07-31 MED ORDER — PANTOPRAZOLE SODIUM 40 MG PO TBEC
40.0000 mg | DELAYED_RELEASE_TABLET | Freq: Every day | ORAL | Status: DC
  Administered 2018-07-31 – 2018-08-01 (×2): 40 mg via ORAL
  Filled 2018-07-31 (×2): qty 1

## 2018-07-31 MED ORDER — AZITHROMYCIN 500 MG PO TABS
250.0000 mg | ORAL_TABLET | Freq: Every day | ORAL | Status: DC
  Administered 2018-08-01: 09:00:00 250 mg via ORAL
  Filled 2018-07-31: qty 1

## 2018-07-31 MED ORDER — SODIUM CHLORIDE 0.9 % IV SOLN
1.0000 g | Freq: Once | INTRAVENOUS | Status: AC
  Administered 2018-07-31: 1 g via INTRAVENOUS
  Filled 2018-07-31: qty 10

## 2018-07-31 MED ORDER — AZITHROMYCIN 250 MG PO TABS: 250.0000 mg | ORAL_TABLET | Freq: Every day | ORAL | Status: DC

## 2018-07-31 MED ORDER — ENOXAPARIN SODIUM 40 MG/0.4ML ~~LOC~~ SOLN
40.0000 mg | SUBCUTANEOUS | Status: DC
  Administered 2018-07-31 – 2018-08-01 (×2): 40 mg via SUBCUTANEOUS
  Filled 2018-07-31 (×3): qty 0.4

## 2018-07-31 MED ORDER — FOLIC ACID 1 MG PO TABS
1.0000 mg | ORAL_TABLET | Freq: Every day | ORAL | Status: DC
  Administered 2018-07-31 – 2018-08-01 (×2): 1 mg via ORAL
  Filled 2018-07-31 (×2): qty 1

## 2018-07-31 MED ORDER — IPRATROPIUM-ALBUTEROL 0.5-2.5 (3) MG/3ML IN SOLN
3.0000 mL | Freq: Three times a day (TID) | RESPIRATORY_TRACT | Status: DC
  Administered 2018-07-31 – 2018-08-01 (×3): 3 mL via RESPIRATORY_TRACT
  Filled 2018-07-31 (×3): qty 3

## 2018-07-31 MED ORDER — LOSARTAN POTASSIUM 50 MG PO TABS
100.0000 mg | ORAL_TABLET | Freq: Every day | ORAL | Status: DC
  Administered 2018-07-31 – 2018-08-01 (×2): 100 mg via ORAL
  Filled 2018-07-31 (×2): qty 2

## 2018-07-31 MED ORDER — ALBUTEROL SULFATE HFA 108 (90 BASE) MCG/ACT IN AERS
8.0000 | INHALATION_SPRAY | RESPIRATORY_TRACT | Status: AC
  Administered 2018-07-31: 8 via RESPIRATORY_TRACT
  Filled 2018-07-31: qty 6.7

## 2018-07-31 MED ORDER — PREDNISONE 20 MG PO TABS: 40.0000 mg | ORAL_TABLET | Freq: Every day | ORAL | Status: DC

## 2018-07-31 MED ORDER — ALBUTEROL SULFATE (2.5 MG/3ML) 0.083% IN NEBU: 2.5000 mg | INHALATION_SOLUTION | Freq: Four times a day (QID) | RESPIRATORY_TRACT | Status: DC | PRN

## 2018-07-31 MED ORDER — ALBUTEROL SULFATE (2.5 MG/3ML) 0.083% IN NEBU
2.5000 mg | INHALATION_SOLUTION | Freq: Four times a day (QID) | RESPIRATORY_TRACT | Status: DC | PRN
  Administered 2018-07-31: 2.5 mg via RESPIRATORY_TRACT
  Filled 2018-07-31: qty 3

## 2018-07-31 MED ORDER — MOMETASONE FURO-FORMOTEROL FUM 200-5 MCG/ACT IN AERO
2.0000 | INHALATION_SPRAY | Freq: Two times a day (BID) | RESPIRATORY_TRACT | Status: DC
  Administered 2018-07-31 – 2018-08-01 (×3): 2 via RESPIRATORY_TRACT
  Filled 2018-07-31: qty 8.8

## 2018-07-31 MED ORDER — METHYLPREDNISOLONE SODIUM SUCC 125 MG IJ SOLR
60.0000 mg | Freq: Two times a day (BID) | INTRAMUSCULAR | Status: DC
  Administered 2018-07-31 – 2018-08-01 (×2): 60 mg via INTRAVENOUS
  Filled 2018-07-31 (×2): qty 2

## 2018-07-31 MED ORDER — UMECLIDINIUM BROMIDE 62.5 MCG/INH IN AEPB
1.0000 | INHALATION_SPRAY | Freq: Every day | RESPIRATORY_TRACT | Status: DC
  Administered 2018-07-31: 1 via RESPIRATORY_TRACT
  Filled 2018-07-31: qty 7

## 2018-07-31 NOTE — Progress Notes (Signed)
   07/31/18 0602  Vitals  Temp 98.3 F (36.8 C)  Temp Source Oral  BP (!) 112/54  MAP (mmHg) 69  BP Location Right Arm  BP Method Automatic  Patient Position (if appropriate) Lying  Pulse Rate (!) 113  Pulse Rate Source Monitor  Oxygen Therapy  SpO2 96 %  O2 Device Aerosol Mask  O2 Flow Rate (L/min) 3 L/min  Height and Weight  Weight 83.4 kg  BMI (Calculated) 26.77  MEWS Score  MEWS RR 1  MEWS Pulse 2  MEWS Systolic 0  MEWS LOC 0  MEWS Temp 0  MEWS Score 3  MEWS Score Color Yellow   Pt arrived to floor from ER in stable condition.  Pt. Is alert and oriented X4.  He is short of breath but currenttly on neb during transit.  We washed him up and oriented him to unit.  Called pharmacy to send his Zithromax.

## 2018-07-31 NOTE — Progress Notes (Signed)
PROGRESS NOTE    James [name]  ZOX:[id number] DOB: Nov 18, 1960 DOA: 07/31/2018 PCP: Scot Jun, FNP   Brief Narrative:  HPI on 07/31/2018 by Dr. Jennette Kettle James [name] is a 58 y.o. male with medical history significant of COPD.  Patient has had 3 prior admits for COPD / PNA thus far this year, most recently in May.  Patient has been being treated for COPD exacerbation and BLL CAP as outpatient by [name] with levaquin and prednisone.  Just finished levaquin yesterday.  Prednisone remained at 40mg  daily with plans to initiate a slow taper.  Despite this, patient developed severe SOB and respiratory distress this evening.  EMS called and patient noted to be satting 80% on RA.  He is not on chronic O2 for his COPD. Assessment & Plan   Admitted by earlier today by Dr. Alcario Drought. See H&P for detials.  Acute on chronic respiratory failure with hypoxia secondary to COPD Exacerbation and pneumonia -Patient noted to have oxygen saturations in the 80s on room air -Chest x-ray noted for pneumonia -Was recently placed on Levaquin and steroids by PCP approximately 1 week ago -Continues to have wheezing on exam -Continue Solu-Medrol, nebulizer treatments, azithromycin and ceftriaxone -Continue supplemental oxygen and try to wean as possible  Essential hypertension -Continue losartan   DVT Prophylaxis Lovenox  Code Status: Full  Family Communication: None at bedside  Disposition Plan: Admitted.   Consultants none  Procedures  none  Antibiotics   Anti-infectives (From admission, onward)   Start     Dose/Rate Route Frequency Ordered Stop   08/01/18 1000  azithromycin (ZITHROMAX) tablet 250 mg     250 mg Oral Daily 07/31/18 0445     08/01/18 0400  cefTRIAXone (ROCEPHIN) 1 g in sodium chloride 0.9 % 100 mL IVPB     1 g 200 mL/hr over 30 Minutes Intravenous Every 24 hours 07/31/18 0445     07/31/18 1000  azithromycin (ZITHROMAX) tablet 250 mg  Status:  Discontinued      250 mg Oral Daily 07/31/18 0445 07/31/18 0445   07/31/18 0415  cefTRIAXone (ROCEPHIN) 1 g in sodium chloride 0.9 % 100 mL IVPB     1 g 200 mL/hr over 30 Minutes Intravenous  Once 07/31/18 0408 07/31/18 0537   07/31/18 0415  azithromycin (ZITHROMAX) 500 mg in sodium chloride 0.9 % 250 mL IVPB     500 mg 250 mL/hr over 60 Minutes Intravenous  Once 07/31/18 0408 07/31/18 1003      Subjective:   James [name] seen and examined today.  Continues to have shortness of breath, mild cough.  Feels that his wheezing and breathing are about the same.  Feels jittery from the steroids.  Denies current chest pain, abdominal pain, nausea or vomiting, diarrhea or constipation, dizziness or headache.  Objective:   Vitals:   07/31/18 0756 07/31/18 0859 07/31/18 1003 07/31/18 1214  BP:   138/78 (!) 152/85  Pulse:   (!) 105 100  Resp:   (!) 22 20  Temp:      TempSrc:      SpO2: 96% 97% 93% 97%  Weight:      Height:        Intake/Output Summary (Last 24 hours) at 07/31/2018 1331 Last data filed at 07/31/2018 0537 Gross per 24 hour  Intake 100 ml  Output -  Net 100 ml   Filed Weights   07/31/18 0231 07/31/18 0602  Weight: 85.3 kg 83.4 kg    Exam  General: Well developed, well nourished, NAD, appears stated age  27: NCAT, mucous membranes moist.   Cardiovascular: S1 S2 auscultated,RRR  Respiratory: Diminished breath sounds, exp wheezing   Abdomen: Soft, nontender, nondistended, + bowel sounds  Extremities: warm dry without cyanosis clubbing or edema  Neuro: AAOx3, nonfocal  Psych: Normal affect and demeanor with intact judgement and insight   Data Reviewed: I have personally reviewed following labs and imaging studies  CBC: Recent Labs  Lab 07/31/18 0240 07/31/18 0247  WBC 19.8*  --   NEUTROABS 16.2*  --   HGB 13.9 13.6  HCT 41.6 40.0  MCV 98.6  --   PLT 240  --    Basic Metabolic Panel: Recent Labs  Lab 07/31/18 0240 07/31/18 0247  NA 139 138  K 3.5 3.3*  CL  102  --   CO2 23  --   GLUCOSE 221*  --   BUN 9  --   CREATININE 0.76  --   CALCIUM 9.0  --    GFR: Estimated Creatinine Clearance: 103.6 mL/min (by C-G formula based on SCr of 0.76 mg/dL). Liver Function Tests: No results for input(s): AST, ALT, ALKPHOS, BILITOT, PROT, ALBUMIN in the last 168 hours. No results for input(s): LIPASE, AMYLASE in the last 168 hours. No results for input(s): AMMONIA in the last 168 hours. Coagulation Profile: No results for input(s): INR, PROTIME in the last 168 hours. Cardiac Enzymes: No results for input(s): CKTOTAL, CKMB, CKMBINDEX, TROPONINI in the last 168 hours. BNP (last 3 results) No results for input(s): PROBNP in the last 8760 hours. HbA1C: No results for input(s): HGBA1C in the last 72 hours. CBG: No results for input(s): GLUCAP in the last 168 hours. Lipid Profile: No results for input(s): CHOL, HDL, LDLCALC, TRIG, CHOLHDL, LDLDIRECT in the last 72 hours. Thyroid Function Tests: No results for input(s): TSH, T4TOTAL, FREET4, T3FREE, THYROIDAB in the last 72 hours. Anemia Panel: No results for input(s): VITAMINB12, FOLATE, FERRITIN, TIBC, IRON, RETICCTPCT in the last 72 hours. Urine analysis:    Component Value Date/Time   COLORURINE YELLOW 04/06/2016 1307   APPEARANCEUR CLEAR 04/06/2016 1307   LABSPEC 1.012 04/06/2016 1307   PHURINE 5.0 04/06/2016 1307   GLUCOSEU >=500 (A) 04/06/2016 1307   HGBUR NEGATIVE 04/06/2016 1307   BILIRUBINUR NEGATIVE 04/06/2016 1307   KETONESUR NEGATIVE 04/06/2016 1307   PROTEINUR NEGATIVE 04/06/2016 1307   UROBILINOGEN 4.0 (H) 06/15/2013 1030   NITRITE NEGATIVE 04/06/2016 1307   LEUKOCYTESUR NEGATIVE 04/06/2016 1307   Sepsis Labs: @LABRCNTIP (procalcitonin:4,lacticidven:4)  ) Recent Results (from the past 240 hour(s))  Fecal occult blood, imunochemical([facility])     Status: None   Collection Time: 07/25/18 11:08 AM   Specimen: Stool   STOOL  Result Value Ref Range Status   Fecal Occult  Bld Negative Negative Final  SARS Coronavirus 2 (CEPHEID- Performed in [place] hospital lab), Hosp Order     Status: None   Collection Time: 07/31/18  3:26 AM   Specimen: Nasopharyngeal Swab  Result Value Ref Range Status   SARS Coronavirus 2 NEGATIVE NEGATIVE Final    Comment: (NOTE) If result is NEGATIVE SARS-CoV-2 target nucleic acids are NOT DETECTED. The SARS-CoV-2 RNA is generally detectable in upper and lower  respiratory specimens during the acute phase of infection. The lowest  concentration of SARS-CoV-2 viral copies this assay can detect is 250  copies / mL. A negative result does not preclude SARS-CoV-2 infection  and should not be used as the sole basis for treatment  or other  patient management decisions.  A negative result may occur with  improper specimen collection / handling, submission of specimen other  than nasopharyngeal swab, presence of viral mutation(s) within the  areas targeted by this assay, and inadequate number of viral copies  (<250 copies / mL). A negative result must be combined with clinical  observations, patient history, and epidemiological information. If result is POSITIVE SARS-CoV-2 target nucleic acids are DETECTED. The SARS-CoV-2 RNA is generally detectable in upper and lower  respiratory specimens dur ing the acute phase of infection.  Positive  results are indicative of active infection with SARS-CoV-2.  Clinical  correlation with patient history and other diagnostic information is  necessary to determine patient infection status.  Positive results do  not rule out bacterial infection or co-infection with other viruses. If result is PRESUMPTIVE POSTIVE SARS-CoV-2 nucleic acids MAY BE PRESENT.   A presumptive positive result was obtained on the submitted specimen  and confirmed on repeat testing.  While 2019 novel coronavirus  (SARS-CoV-2) nucleic acids may be present in the submitted sample  additional confirmatory testing may be  necessary for epidemiological  and / or clinical management purposes  to differentiate between  SARS-CoV-2 and other Sarbecovirus currently known to infect humans.  If clinically indicated additional testing with an alternate test  methodology [phone]) is advised. The SARS-CoV-2 RNA is generally  detectable in upper and lower respiratory sp ecimens during the acute  phase of infection. The expected result is Negative. Fact Sheet for Patients:  StrictlyIdeas.no Fact Sheet for Healthcare Providers: BankingDealers.co.za This test is not yet approved or cleared by the [place] FDA and has been authorized for detection and/or diagnosis of SARS-CoV-2 by FDA under an Emergency Use Authorization (EUA).  This EUA will remain in effect (meaning this test can be used) for the duration of the COVID-19 declaration under Section 564(b)(1) of the Act, 21 U.S.C. section 360bbb-3(b)(1), unless the authorization is terminated or revoked sooner. Performed at [facility], [place] [address] [place], [place] [address]       Radiology Studies: Dg Chest Port 1 View  Result Date: 07/31/2018 CLINICAL DATA:  Shortness of breath EXAM: PORTABLE CHEST 1 VIEW COMPARISON:  07/18/2018 FINDINGS: Patchy bilateral lower lobe opacities, suspicious for pneumonia. No pleural effusion or pneumothorax. The heart is normal in size. IMPRESSION: Patchy bilateral lower lobe opacities, suspicious for pneumonia. Electronically Signed   By: Julian Hy M.D.   On: 07/31/2018 03:13     Scheduled Meds: . [START ON 08/01/2018] azithromycin  250 mg Oral Daily  . enoxaparin (LOVENOX) injection  40 mg Subcutaneous Q24H  . folic acid  1 mg Oral Daily  . ipratropium-albuterol  3 mL Nebulization TID  . losartan  100 mg Oral Daily  . methylPREDNISolone (SOLU-MEDROL) injection  60 mg Intravenous Q12H  . mometasone-formoterol  2 puff Inhalation BID  . pantoprazole  40 mg Oral  Daily  . potassium chloride  40 mEq Oral Once   Continuous Infusions: . [START ON 08/01/2018] cefTRIAXone (ROCEPHIN)  IV       LOS: 0 days   Time Spent in minutes   30 minutes  James [name] D.O. on 07/31/2018 at 1:31 PM  Between 7am to 7pm - Please see pager noted on [email or web address]  After 7pm go to [email or web address]  And look for the night coverage person covering for me after hours  [facility] Office  [phone]

## 2018-07-31 NOTE — ED Triage Notes (Signed)
BIB [facility] d/t shob.  Finished antibiotics for PNA yesterday. Woke up in distress. Given 2 duo nebs,  125 mg solumedrol, 2g mag en route.

## 2018-07-31 NOTE — ED Provider Notes (Signed)
[facility] EMERGENCY DEPARTMENT Provider Note   CSN: [id number] Arrival date & time: 07/31/18  0226    History   Chief Complaint Chief Complaint  Patient presents with  . Shortness of Breath    HPI James [name] is a 58 y.o. male.     Patient presents to the emergency department for evaluation of shortness of breath.  Patient has a history of COPD and recurrent pneumonia.  Patient was seen by his pulmonologist a week ago and he just finished a course of Levaquin.  Patient reports that he has been struggling all day with difficulty breathing and his breathing worsened tonight.  EMS reports that he was hypoxic on room air upon their arrival.  He was placed on nonrebreather facemask and administered duo nebs, Solu-Medrol and magnesium.  Patient still complains of severe shortness of breath at arrival.     Past Medical History:  Diagnosis Date  . COPD (chronic obstructive pulmonary disease) ([place])   . Pneumonia 04/06/2016  . Pneumothorax    2016, fell from ladder  . Tick bite 06/03/2018    Patient Active Problem List   Diagnosis Date Noted  . CAP (community acquired pneumonia) 05/17/2018  . COPD exacerbation ([place]) 02/16/2018  . Hypertension 05/17/2016  . Low back pain radiating to both legs 04/18/2016  . History of tobacco abuse 04/06/2016  . Alcohol abuse 04/06/2016  . Elevated liver enzymes 04/06/2016  . COPD mixed type ([place]) 03/26/2016  . Acute on chronic respiratory failure with hypoxia ([employer]) 06/08/2013    Past Surgical History:  Procedure Laterality Date  . VIDEO ASSISTED THORACOSCOPY (VATS)/THOROCOTOMY Left 06/11/2013   Procedure: VIDEO ASSISTED THORACOSCOPY (VATS)/THOROCOTOMY;  Surgeon: Ivin Poot, MD;  Location: [facility];  Service: Thoracic;  Laterality: Left;  [name] VIDEO BRONCHOSCOPY N/A 06/11/2013   Procedure: VIDEO BRONCHOSCOPY;  Surgeon: Ivin Poot, MD;  Location: [facility] OR;  Service: Thoracic;  Laterality: N/A;        Home Medications    Prior to Admission medications   Medication Sig Start Date End Date Taking? Authorizing Provider  albuterol (PROVENTIL HFA;VENTOLIN HFA) 108 (90 Base) MCG/ACT inhaler Inhale 1-2 puffs into the lungs every 6 (six) hours as needed for wheezing or shortness of breath. 03/29/18  Yes Scot Jun, FNP  albuterol (PROVENTIL) (2.5 MG/3ML) 0.083% nebulizer solution Take 3 mLs (2.5 mg total) by nebulization every 6 (six) hours as needed for wheezing or shortness of breath. 06/20/18  Yes Scot Jun, FNP  folic acid (FOLVITE) 1 MG tablet Take 1 tablet (1 mg total) by mouth daily. 07/18/18  Yes Scot Jun, FNP  losartan (COZAAR) 100 MG tablet Take 1 tablet (100 mg total) by mouth daily. 04/27/18  Yes [name], MD  mometasone-formoterol (DULERA) 200-5 MCG/ACT AERO Inhale 2 puffs into the lungs 2 (two) times daily. 07/18/18  Yes Scot Jun, FNP  pantoprazole (PROTONIX) 40 MG tablet Take 40 mg by mouth daily.   Yes [provider]  potassium gluconate 595 (99 K) MG TABS tablet Take 595 mg by mouth 2 (two) times daily.   Yes [provider]  umeclidinium bromide (INCRUSE ELLIPTA) 62.5 MCG/INH AEPB Inhale 1 puff into the lungs daily. 07/25/18  Yes [name], MD  predniSONE (DELTASONE) 10 MG tablet Take 4 for four days 3 for four days 2 for four days then one daily and stay Patient not taking: Reported on 07/31/2018 07/25/18   [name], MD  Respiratory Therapy  Supplies (FLUTTER) DEVI Use 4 times daily 04/27/18   [name], MD    Family History Family History  Problem Relation Age of Onset  . Heart disease Father   . COPD Sister     Social History Social History   Tobacco Use  . Smoking status: Former Smoker    Packs/day: 1.50    Years: 40.00    Pack years: 60.00    Types: Cigarettes  . Smokeless tobacco: Current User    Types: Chew  Substance Use Topics  . Alcohol use: Yes    Alcohol/week: 21.0 standard drinks    Types: 21  Cans of beer per week  . Drug use: Yes    Types: Marijuana    Comment: daily      Allergies   Patient has no known allergies.   Review of Systems Review of Systems  Respiratory: Positive for shortness of breath.   All other systems reviewed and are negative.    Physical Exam Updated Vital Signs BP (!) 149/72   Pulse (!) 111   Temp 98.6 F (37 C) (Oral)   Resp (!) 22   Ht 5' 9.5" (1.765 m)   Wt 85.3 kg   SpO2 96%   BMI 27.36 kg/m   Physical Exam Vitals signs and nursing note reviewed.  Constitutional:      General: He is not in acute distress.    Appearance: Normal appearance. He is well-developed.  HENT:     Head: Normocephalic and atraumatic.     Right Ear: Hearing normal.     Left Ear: Hearing normal.     Nose: Nose normal.  Eyes:     Conjunctiva/sclera: Conjunctivae normal.     Pupils: Pupils are equal, round, and reactive to light.  Neck:     Musculoskeletal: Normal range of motion and neck supple.  Cardiovascular:     Rate and Rhythm: Regular rhythm.     Heart sounds: S1 normal and S2 normal. No murmur. No friction rub. No gallop.   Pulmonary:     Effort: Tachypnea, accessory muscle usage and respiratory distress present.     Breath sounds: Decreased breath sounds and wheezing present.  Chest:     Chest wall: No tenderness.  Abdominal:     General: Bowel sounds are normal.     Palpations: Abdomen is soft.     Tenderness: There is no abdominal tenderness. There is no guarding or rebound. Negative signs include Murphy's sign and McBurney's sign.     Hernia: No hernia is present.  Musculoskeletal: Normal range of motion.  Skin:    General: Skin is warm and dry.     Findings: No rash.  Neurological:     Mental Status: He is alert and oriented to person, place, and time.     GCS: GCS eye subscore is 4. GCS verbal subscore is 5. GCS motor subscore is 6.     Cranial Nerves: No cranial nerve deficit.     Sensory: No sensory deficit.     Coordination:  Coordination normal.  Psychiatric:        Speech: Speech normal.        Behavior: Behavior normal.        Thought Content: Thought content normal.      ED Treatments / Results  Labs (all labs ordered are listed, but only abnormal results are displayed) Labs Reviewed  CBC WITH DIFFERENTIAL/PLATELET - Abnormal; Notable for the following components:      Result  Value   WBC 19.8 (*)    Neutro Abs 16.2 (*)    Abs Immature Granulocytes 0.10 (*)    All other components within normal limits  BASIC METABOLIC PANEL - Abnormal; Notable for the following components:   Glucose, Bld 221 (*)    All other components within normal limits  POCT I-STAT 7, (LYTES, BLD GAS, ICA,H+H) - Abnormal; Notable for the following components:   pO2, Arterial 235.0 (*)    Potassium 3.3 (*)    All other components within normal limits  SARS CORONAVIRUS 2 (HOSPITAL ORDER, [facility])  CULTURE, BLOOD (ROUTINE X 2)  CULTURE, BLOOD (ROUTINE X 2)  BRAIN NATRIURETIC PEPTIDE  HIV ANTIBODY (ROUTINE TESTING W REFLEX)  I-STAT ARTERIAL BLOOD GAS, ED  TROPONIN I (HIGH SENSITIVITY)  TROPONIN I (HIGH SENSITIVITY)    EKG EKG Interpretation  Date/Time:  Monday July 31 2018 02:31:24 EDT Ventricular Rate:  106 PR Interval:    QRS Duration: 99 QT Interval:  358 QTC Calculation: 476 R Axis:   53 Text Interpretation:  Sinus tachycardia Consider anterolateral infarct Confirmed by Orpah Greek [phone]) on 07/31/2018 2:32:19 AM   Radiology Dg Chest Port 1 View  Result Date: 07/31/2018 CLINICAL DATA:  Shortness of breath EXAM: PORTABLE CHEST 1 VIEW COMPARISON:  07/18/2018 FINDINGS: Patchy bilateral lower lobe opacities, suspicious for pneumonia. No pleural effusion or pneumothorax. The heart is normal in size. IMPRESSION: Patchy bilateral lower lobe opacities, suspicious for pneumonia. Electronically Signed   By: Julian Hy M.D.   On: 07/31/2018 03:13    Procedures Procedures  (including critical care time)  Medications Ordered in ED Medications  azithromycin (ZITHROMAX) 500 mg in sodium chloride 0.9 % 250 mL IVPB (has no administration in time range)  losartan (COZAAR) tablet 100 mg (has no administration in time range)  mometasone-formoterol (DULERA) 200-5 MCG/ACT inhaler 2 puff (has no administration in time range)  pantoprazole (PROTONIX) EC tablet 40 mg (has no administration in time range)  umeclidinium bromide (INCRUSE ELLIPTA) 62.5 MCG/INH 1 puff (has no administration in time range)  albuterol (PROVENTIL) (2.5 MG/3ML) 0.083% nebulizer solution 2.5 mg (has no administration in time range)  folic acid (FOLVITE) tablet 1 mg (has no administration in time range)  cefTRIAXone (ROCEPHIN) 1 g in sodium chloride 0.9 % 100 mL IVPB (has no administration in time range)  azithromycin (ZITHROMAX) tablet 250 mg (has no administration in time range)  enoxaparin (LOVENOX) injection 40 mg (has no administration in time range)  methylPREDNISolone sodium succinate (SOLU-MEDROL) 125 mg/2 mL injection 60 mg (has no administration in time range)  albuterol (VENTOLIN HFA) 108 (90 Base) MCG/ACT inhaler 8 puff (8 puffs Inhalation Given 07/31/18 0237)  cefTRIAXone (ROCEPHIN) 1 g in sodium chloride 0.9 % 100 mL IVPB (1 g Intravenous New Bag/Given 07/31/18 0502)  albuterol (PROVENTIL,VENTOLIN) solution continuous neb (10 mg/hr Nebulization Given 07/31/18 0525)     Initial Impression / Assessment and Plan / ED Course  I have reviewed the triage vital signs and the nursing notes.  Pertinent labs & imaging results that were available during my care of the patient were reviewed by me and considered in my medical decision making (see chart for details).        Patient presents to the emergency department for evaluation of shortness of breath.  Patient with history of COPD and recurrent pneumonia presents to the ER with worsening shortness of breath despite recent treatment with a  course of Levaquin for pneumonia.  Patient reports that he was told he had pneumonia in his right lung.  X-ray today shows bibasilar infiltrates.  COVID-19 testing is negative.  Patient administered Rocephin and Zithromax IV, blood cultures obtained.  Will require hospitalization for new oxygen requirement in the setting of pneumonia not resolved with outpatient treatment.  Final Clinical Impressions(s) / ED Diagnoses   Final diagnoses:  COPD exacerbation ([place])  Community acquired pneumonia, unspecified laterality    ED Discharge Orders    None       , Gwenyth Allegra, MD 07/31/18 [phone]

## 2018-07-31 NOTE — H&P (Signed)
History and Physical    James [name] RKY:[id number] DOB: 1960/12/22 DOA: 07/31/2018  PCP: Scot Jun, FNP  Patient coming from: Home  I have personally briefly reviewed patient's old medical records in [place]  Chief Complaint: SOB  HPI: James [name] is a 58 y.o. male with medical history significant of COPD.  Patient has had 3 prior admits for COPD / PNA thus far this year, most recently in May.  Patient has been being treated for COPD exacerbation and BLL CAP as outpatient by [name] with levaquin and prednisone.  Just finished levaquin yesterday.  Prednisone remained at 40mg  daily with plans to initiate a slow taper.  Despite this, patient developed severe SOB and respiratory distress this evening.  EMS called and patient noted to be satting 80% on RA.  He is not on chronic O2 for his COPD.   ED Course: In the ED patient given breathing treatments, solumedrol, magnesium, rocephin and azithromycin.  CXR shows BLL PNA.  WBC 19k.  No fever.  SOB improved some but still requiring O2.  hospitalist asked to admit.  COVID negative.   Review of Systems: As per HPI, otherwise all review of systems negative.  Past Medical History:  Diagnosis Date  . COPD (chronic obstructive pulmonary disease) ([place])   . Pneumonia 04/06/2016  . Pneumothorax    2016, fell from ladder  . Tick bite 06/03/2018    Past Surgical History:  Procedure Laterality Date  . VIDEO ASSISTED THORACOSCOPY (VATS)/THOROCOTOMY Left 06/11/2013   Procedure: VIDEO ASSISTED THORACOSCOPY (VATS)/THOROCOTOMY;  Surgeon: Ivin Poot, MD;  Location: [place];  Service: Thoracic;  Laterality: Left;  [name] VIDEO BRONCHOSCOPY N/A 06/11/2013   Procedure: VIDEO BRONCHOSCOPY;  Surgeon: Ivin Poot, MD;  Location: [place];  Service: Thoracic;  Laterality: N/A;     reports that he has quit smoking. His smoking use included cigarettes. He has a 60.00 pack-year smoking history. His smokeless tobacco use includes chew.  He reports current alcohol use of about 21.0 standard drinks of alcohol per week. He reports current drug use. Drug: Marijuana.  No Known Allergies  Family History  Problem Relation Age of Onset  . Heart disease Father   . COPD Sister      Prior to Admission medications   Medication Sig Start Date End Date Taking? Authorizing Provider  albuterol (PROVENTIL HFA;VENTOLIN HFA) 108 (90 Base) MCG/ACT inhaler Inhale 1-2 puffs into the lungs every 6 (six) hours as needed for wheezing or shortness of breath. 03/29/18  Yes Scot Jun, FNP  albuterol (PROVENTIL) (2.5 MG/3ML) 0.083% nebulizer solution Take 3 mLs (2.5 mg total) by nebulization every 6 (six) hours as needed for wheezing or shortness of breath. 06/20/18  Yes Scot Jun, FNP  folic acid (FOLVITE) 1 MG tablet Take 1 tablet (1 mg total) by mouth daily. 07/18/18  Yes Scot Jun, FNP  losartan (COZAAR) 100 MG tablet Take 1 tablet (100 mg total) by mouth daily. 04/27/18  Yes [name], MD  mometasone-formoterol (DULERA) 200-5 MCG/ACT AERO Inhale 2 puffs into the lungs 2 (two) times daily. 07/18/18  Yes Scot Jun, FNP  pantoprazole (PROTONIX) 40 MG tablet Take 40 mg by mouth daily.   Yes [provider]  potassium gluconate 595 (99 K) MG TABS tablet Take 595 mg by mouth 2 (two) times daily.   Yes [provider]  umeclidinium bromide (INCRUSE ELLIPTA) 62.5 MCG/INH AEPB Inhale 1 puff into the lungs daily. 07/25/18  Yes [name], MD  predniSONE (DELTASONE) 10 MG tablet Take 4 for four days 3 for four days 2 for four days then one daily and stay Patient not taking: Reported on 07/31/2018 07/25/18   [name], MD  Respiratory Therapy Supplies (FLUTTER) DEVI Use 4 times daily 04/27/18   [name], MD    Physical Exam: Vitals:   07/31/18 0230 07/31/18 0231 07/31/18 0245 07/31/18 0330  BP:   122/62 (!) 110/59  Pulse: (!) 112  (!) 108 (!) 109  Resp: (!) 22  (!) 23 (!) 22   Temp: 98.6 F (37 C)     TempSrc: Oral     SpO2: 99%  100% 94%  Weight:  85.3 kg    Height:  5' 9.5" (1.765 m)      Constitutional: NAD, calm, comfortable Eyes: PERRL, lids and conjunctivae normal ENMT: Mucous membranes are moist. Posterior pharynx clear of any exudate or lesions.Normal dentition.  Neck: normal, supple, no masses, no thyromegaly Respiratory: Tachypnea, diffuse wheezing and decreased breath sounds. Cardiovascular: Regular rate and rhythm, no murmurs / rubs / gallops. No extremity edema. 2+ pedal pulses. No carotid bruits.  Abdomen: no tenderness, no masses palpated. No hepatosplenomegaly. Bowel sounds positive.  Musculoskeletal: no clubbing / cyanosis. No joint deformity upper and lower extremities. Good ROM, no contractures. Normal muscle tone.  Skin: no rashes, lesions, ulcers. No induration Neurologic: CN 2-12 grossly intact. Sensation intact, DTR normal. Strength 5/5 in all 4.  Psychiatric: Normal judgment and insight. Alert and oriented x 3. Normal mood.    Labs on Admission: I have personally reviewed following labs and imaging studies  CBC: Recent Labs  Lab 07/31/18 0240 07/31/18 0247  WBC 19.8*  --   NEUTROABS 16.2*  --   HGB 13.9 13.6  HCT 41.6 40.0  MCV 98.6  --   PLT 240  --    Basic Metabolic Panel: Recent Labs  Lab 07/31/18 0240 07/31/18 0247  NA 139 138  K 3.5 3.3*  CL 102  --   CO2 23  --   GLUCOSE 221*  --   BUN 9  --   CREATININE 0.76  --   CALCIUM 9.0  --    GFR: Estimated Creatinine Clearance: 103.6 mL/min (by C-G formula based on SCr of 0.76 mg/dL). Liver Function Tests: No results for input(s): AST, ALT, ALKPHOS, BILITOT, PROT, ALBUMIN in the last 168 hours. No results for input(s): LIPASE, AMYLASE in the last 168 hours. No results for input(s): AMMONIA in the last 168 hours. Coagulation Profile: No results for input(s): INR, PROTIME in the last 168 hours. Cardiac Enzymes: No results for input(s): CKTOTAL, CKMB,  CKMBINDEX, TROPONINI in the last 168 hours. BNP (last 3 results) No results for input(s): PROBNP in the last 8760 hours. HbA1C: No results for input(s): HGBA1C in the last 72 hours. CBG: No results for input(s): GLUCAP in the last 168 hours. Lipid Profile: No results for input(s): CHOL, HDL, LDLCALC, TRIG, CHOLHDL, LDLDIRECT in the last 72 hours. Thyroid Function Tests: No results for input(s): TSH, T4TOTAL, FREET4, T3FREE, THYROIDAB in the last 72 hours. Anemia Panel: No results for input(s): VITAMINB12, FOLATE, FERRITIN, TIBC, IRON, RETICCTPCT in the last 72 hours. Urine analysis:    Component Value Date/Time   COLORURINE YELLOW 04/06/2016 1307   APPEARANCEUR CLEAR 04/06/2016 1307   LABSPEC 1.012 04/06/2016 1307   PHURINE 5.0 04/06/2016 1307   GLUCOSEU >=500 (A) 04/06/2016 1307   HGBUR NEGATIVE 04/06/2016 1307  BILIRUBINUR NEGATIVE 04/06/2016 [place] 04/06/2016 1307   PROTEINUR NEGATIVE 04/06/2016 1307   UROBILINOGEN 4.0 (H) 06/15/2013 1030   NITRITE NEGATIVE 04/06/2016 1307   LEUKOCYTESUR NEGATIVE 04/06/2016 1307    Radiological Exams on Admission: Dg Chest Port 1 View  Result Date: 07/31/2018 CLINICAL DATA:  Shortness of breath EXAM: PORTABLE CHEST 1 VIEW COMPARISON:  07/18/2018 FINDINGS: Patchy bilateral lower lobe opacities, suspicious for pneumonia. No pleural effusion or pneumothorax. The heart is normal in size. IMPRESSION: Patchy bilateral lower lobe opacities, suspicious for pneumonia. Electronically Signed   By: Julian Hy M.D.   On: 07/31/2018 03:13    EKG: Independently reviewed.  Assessment/Plan Principal Problem:   CAP (community acquired pneumonia) Active Problems:   Acute on chronic respiratory failure with hypoxia (HCC)   Hypertension   COPD exacerbation ([place])    1. Acute on chronic resp failure with hypoxia - due to CAP and COPD exacerbation.  No real way to tell if leukocytosis is due to steroids vs PNA at this point. 1.  CAP pathway 2. Rocephin / azithro 3. Cultures pending 4. Adult wheeze protocol 5. Continue home nebs 6. Solumedrol 60mg  IV Q12H 7. O2 as needed 8. Continuous pulse ox 2. HTN - continue losartan  DVT prophylaxis: Lovenox Code Status: Full Family Communication: No family in room Disposition Plan: Home after admit Consults called: None Admission status: Admit to inpatient  Severity of Illness: The appropriate patient status for this patient is INPATIENT. Inpatient status is judged to be reasonable and necessary in order to provide the required intensity of service to ensure the patient's safety. The patient's presenting symptoms, physical exam findings, and initial radiographic and laboratory data in the context of their chronic comorbidities is felt to place them at high risk for further clinical deterioration. Furthermore, it is not anticipated that the patient will be medically stable for discharge from the hospital within 2 midnights of admission. The following factors support the patient status of inpatient.   IP status for treatment of CAP and COPD exacerbation due to: 1) new O2 requirement 2) failed outpatient treatment with ABx and steroids   * I certify that at the point of admission it is my clinical judgment that the patient will require inpatient hospital care spanning beyond 2 midnights from the point of admission due to high intensity of service, high risk for further deterioration and high frequency of surveillance required.*    GARDNER, JARED M. DO [facility]  How to contact the [facility] Attending or Consulting provider [place] or covering provider during after hours [place], for this patient?  1. Check the care team in [facility] and look for a) attending/consulting [facility] provider listed and b) the [facility] team listed 2. Log into [email or web address]  Amion Physician Scheduling and messaging for groups and whole hospitals  On call and physician scheduling software for group practices,  residents, hospitalists and other medical providers for call, clinic, rotation and shift schedules. On[employer] is a hospital-wide system for scheduling doctors and paging doctors on call. EasyPlot is for scientific plotting and data analysis.  [email or web address]  and use [place]'s universal password to access. If you do not have the password, please contact the hospital operator.  3. Locate the [facility] provider you are looking for under [facility] and page to a number that you can be directly reached. 4. If you still have difficulty reaching the provider, please page the [facility] (Director on Call) for the Hospitalists listed on  amion for assistance.  07/31/2018, 5:06 AM

## 2018-08-01 LAB — CBC
HCT: 42.3 % (ref 39.0–52.0)
Hemoglobin: 14.1 g/dL (ref 13.0–17.0)
MCH: 32.9 pg (ref 26.0–34.0)
MCHC: 33.3 g/dL (ref 30.0–36.0)
MCV: 98.6 fL (ref 80.0–100.0)
Platelets: 237 10*3/uL (ref 150–400)
RBC: 4.29 MIL/uL (ref 4.22–5.81)
RDW: 12.7 % (ref 11.5–15.5)
WBC: 17.3 10*3/uL — ABNORMAL HIGH (ref 4.0–10.5)
nRBC: 0 % (ref 0.0–0.2)

## 2018-08-01 LAB — BASIC METABOLIC PANEL
Anion gap: 9 (ref 5–15)
BUN: 15 mg/dL (ref 6–20)
CO2: 26 mmol/L (ref 22–32)
Calcium: 9.3 mg/dL (ref 8.9–10.3)
Chloride: 103 mmol/L (ref 98–111)
Creatinine, Ser: 0.72 mg/dL (ref 0.61–1.24)
GFR calc Af Amer: >60 mL/min (ref >60)
GFR calc non Af Amer: >60 mL/min (ref >60)
Glucose, Bld: 181 mg/dL — ABNORMAL HIGH (ref 70–99)
Potassium: 4.9 mmol/L (ref 3.5–5.1)
Sodium: 138 mmol/L (ref 135–145)

## 2018-08-01 MED ORDER — IPRATROPIUM-ALBUTEROL 0.5-2.5 (3) MG/3ML IN SOLN: 3.0000 mL | Freq: Two times a day (BID) | RESPIRATORY_TRACT | Status: DC

## 2018-08-01 MED ORDER — ALBUTEROL SULFATE (2.5 MG/3ML) 0.083% IN NEBU: 2.5000 mg | INHALATION_SOLUTION | RESPIRATORY_TRACT | Status: DC | PRN

## 2018-08-01 MED ORDER — AZITHROMYCIN 250 MG PO TABS: 250.0000 mg | ORAL_TABLET | Freq: Every day | ORAL | 0 refills | Status: AC

## 2018-08-01 MED ORDER — PREDNISONE 20 MG PO TABS: ORAL_TABLET | ORAL | 0 refills | Status: DC

## 2018-08-01 MED ORDER — CEPHALEXIN 500 MG PO CAPS: 500.0000 mg | ORAL_CAPSULE | Freq: Two times a day (BID) | ORAL | 0 refills | Status: AC

## 2018-08-01 MED FILL — predniSONE 20 MG TABS: 20 | 9 days supply | Qty: 18 | Fill #0

## 2018-08-01 MED FILL — AZITHROMYCIN 250 MG TABLET: 250 | 4 days supply | Qty: 4 | Fill #0

## 2018-08-01 MED FILL — CEPHALEXIN 500 MG CAPSULE: 500 | 5 days supply | Qty: 10 | Fill #0

## 2018-08-01 NOTE — Telephone Encounter (Signed)
Call placed to patient to inquire if family had any additional questions or concerns. LCSW left message for a return call.

## 2018-08-01 NOTE — Discharge Instructions (Signed)
Chronic Obstructive Pulmonary Disease °Chronic obstructive pulmonary disease (COPD) is a long-term (chronic) lung problem. When you have COPD, it is hard for air to get in and out of your lungs. Usually the condition gets worse over time, and your lungs will never return to normal. There are things you can do to keep yourself as healthy as possible. °· Your doctor may treat your condition with: °? Medicines. °? Oxygen. °? Lung surgery. °· Your doctor may also recommend: °? Rehabilitation. This includes steps to make your body work better. It may involve a team of specialists. °? Quitting smoking, if you smoke. °? Exercise and changes to your diet. °? Comfort measures (palliative care). °Follow these instructions at home: °Medicines °· Take over-the-counter and prescription medicines only as told by your doctor. °· Talk to your doctor before taking any cough or allergy medicines. You may need to avoid medicines that cause your lungs to be dry. °Lifestyle °· If you smoke, stop. Smoking makes the problem worse. If you need help quitting, ask your doctor. °· Avoid being around things that make your breathing worse. This may include smoke, chemicals, and fumes. °· Stay active, but remember to rest as well. °· Learn and use tips on how to relax. °· Make sure you get enough sleep. Most adults need at least 7 hours of sleep every night. °· Eat healthy foods. Eat smaller meals more often. Rest before meals. °Controlled breathing °Learn and use tips on how to control your breathing as told by your doctor. Try: °· Breathing in (inhaling) through your nose for 1 second. Then, pucker your lips and breath out (exhale) through your lips for 2 seconds. °· Putting one hand on your belly (abdomen). Breathe in slowly through your nose for 1 second. Your hand on your belly should move out. Pucker your lips and breathe out slowly through your lips. Your hand on your belly should move in as you breathe out. ° °Controlled coughing °Learn  and use controlled coughing to clear mucus from your lungs. Follow these steps: °1. Lean your head a little forward. °2. Breathe in deeply. °3. Try to hold your breath for 3 seconds. °4. Keep your mouth slightly open while coughing 2 times. °5. Spit any mucus out into a tissue. °6. Rest and do the steps again 1 or 2 times as needed. °General instructions °· Make sure you get all the shots (vaccines) that your doctor recommends. Ask your doctor about a flu shot and a pneumonia shot. °· Use oxygen therapy and pulmonary rehabilitation if told by your doctor. If you need home oxygen therapy, ask your doctor if you should buy a tool to measure your oxygen level (oximeter). °· Make a COPD action plan with your doctor. This helps you to know what to do if you feel worse than usual. °· Manage any other conditions you have as told by your doctor. °· Avoid going outside when it is very hot, cold, or humid. °· Avoid people who have a sickness you can catch (contagious). °· Keep all follow-up visits as told by your doctor. This is important. °Contact a doctor if: °· You cough up more mucus than usual. °· There is a change in the color or thickness of the mucus. °· It is harder to breathe than usual. °· Your breathing is faster than usual. °· You have trouble sleeping. °· You need to use your medicines more often than usual. °· You have trouble doing your normal activities such as getting dressed   or walking around the house. °Get help right away if: °· You have shortness of breath while resting. °· You have shortness of breath that stops you from: °? Being able to talk. °? Doing normal activities. °· Your chest hurts for longer than 5 minutes. °· Your skin color is more blue than usual. °· Your pulse oximeter shows that you have low oxygen for longer than 5 minutes. °· You have a fever. °· You feel too tired to breathe normally. °Summary °· Chronic obstructive pulmonary disease (COPD) is a long-term lung problem. °· The way your  lungs work will never return to normal. Usually the condition gets worse over time. There are things you can do to keep yourself as healthy as possible. °· Take over-the-counter and prescription medicines only as told by your doctor. °· If you smoke, stop. Smoking makes the problem worse. °This information is not intended to replace advice given to you by your health care provider. Make sure you discuss any questions you have with your health care provider. °Document Released: 06/09/2007 Document Revised: 12/03/2016 Document Reviewed: 01/26/2016 °Elsevier Patient Education © 2020 [employer] ° °

## 2018-08-01 NOTE — Progress Notes (Signed)
  Patient Saturations on Room Air at Rest = 98 % on room air  Patient Saturations on Room Air while Ambulating down the hall to the nursing statioon = 95% on room air.  Pt ambulated down the hall to the nursing station on room air oxygen sat 95%  no SOB or complaints of pain when asked.

## 2018-08-01 NOTE — Progress Notes (Signed)
James [name] to be D/C'd home per MD order.  Discussed with the patient and all questions fully answered.  VSS, Skin clean, dry and intact without evidence of skin break down, no evidence of skin tears noted. IV catheter discontinued intact. Site without signs and symptoms of complications. Dressing and pressure applied.  An After Visit Summary was printed and given to the patient. Patient received prescription.  D/c education completed with patient/family including follow up instructions, medication list, d/c activities limitations if indicated, with other d/c instructions as indicated by MD - patient able to verbalize understanding, all questions fully answered.   Patient instructed to return to ED, call 911, or call MD for any changes in condition.   Patient escorted via [place], and D/C home via private auto.   James [name] 08/01/2018 3:21 PM

## 2018-08-01 NOTE — Discharge Summary (Signed)
Physician Discharge Summary  James [name] QZE:[id number] DOB: 02-May-1960 DOA: 07/31/2018  PCP: Scot Jun, FNP  Admit date: 07/31/2018 Discharge date: 08/01/2018  Time spent: 45 minutes  Recommendations for Outpatient Follow-up:  Patient will be discharged to home.  Patient will need to follow up with primary care provider within one week of discharge.  Patient should continue medications as prescribed.  Patient should follow a heart healthy diet.   Discharge Diagnoses:  Acute on chronic respiratory failure with hypoxia secondary to COPD Exacerbation and pneumonia Essential hypertension  Discharge Condition: Stable  Diet recommendation: heart healthy  Filed Weights   07/31/18 0231 07/31/18 0602  Weight: 85.3 kg 83.4 kg    History of present illness:  on 07/31/2018 by Dr. [name] a 58 y.o.malewith medical history significant ofCOPD. Patient has had 3 prior admits for COPD / PNA thus far this year, most recently in May.  Patient has been being treated for COPD exacerbation and BLL CAP as outpatient by [name] with levaquin and prednisone. Just finished levaquin yesterday. Prednisone remained at 40mg  daily with plans to initiate a slow taper.  Despite this, patient developed severe SOB and respiratory distress this evening. EMS called and patient noted to be satting 80% on RA. He is not on chronic O2 for his COPD.  Hospital Course:  Acute on chronic respiratory failure with hypoxia secondary to COPD Exacerbation and pneumonia -Patient noted to have oxygen saturations in the 80s on room air -Chest x-ray noted for pneumonia -Was recently placed on Levaquin and steroids by PCP approximately 1 week ago -On admission, patient continued to have wheezing well into his hospital stay day 1.  Patient currently still has some wheezing however has improved. -Was placed on Solu-Medrol, along with nebulizer treatments, azithromycin and ceftriaxone  -Patient did require oxygen upon admission however has been able to ambulate and maintain oxygen saturations in the high 90s on room air. -Will discharge patient with prednisone taper, azithromycin and Ceftin. -Given that patient had oxygen saturations in the 80s on admission, it was thought that he would require several days in the hospital. Patient actually improved quicker than expected.  Essential hypertension -Continue losartan  Procedures: None  Consultations: None  Discharge Exam: Vitals:   08/01/18 0917 08/01/18 1430  BP: 125/70   Pulse: 86   Resp:    Temp:    SpO2: 95% 92%     General: Well developed, well nourished, NAD, appears stated age  HEENT: NCAT, mucous membranes moist.  Cardiovascular: S1 S2 auscultated, RRR  Respiratory: Clear to auscultation bilaterally-no wheezing  Abdomen: Soft, nontender, nondistended, + bowel sounds  Extremities: warm dry without cyanosis clubbing or edema  Neuro: AAOx3, nonfocal  Psych: Appropriate mood and affect, pleasant  Discharge Instructions Discharge Instructions    Discharge instructions   Complete by: As directed    Patient will be discharged to home.  Patient will need to follow up with primary care provider within one week of discharge.  Patient should continue medications as prescribed.  Patient should follow a heart healthy diet.     Allergies as of 08/01/2018   No Known Allergies     Medication List    TAKE these medications   albuterol 108 (90 Base) MCG/ACT inhaler Commonly known as: VENTOLIN HFA Inhale 1-2 puffs into the lungs every 6 (six) hours as needed for wheezing or shortness of breath.   albuterol (2.5 MG/3ML) 0.083% nebulizer solution Commonly known as: PROVENTIL Take 3 mLs (  2.5 mg total) by nebulization every 6 (six) hours as needed for wheezing or shortness of breath.   azithromycin 250 MG tablet Commonly known as: ZITHROMAX Take 1 tablet (250 mg total) by mouth daily for 4 days.  Start taking on: August 02, 2018   cephALEXin 500 MG capsule Commonly known as: KEFLEX Take 1 capsule (500 mg total) by mouth 2 (two) times daily for 5 days.   Flutter Devi Use 4 times daily   folic acid 1 MG tablet Commonly known as: FOLVITE Take 1 tablet (1 mg total) by mouth daily.   Incruse Ellipta 62.5 MCG/INH Aepb Generic drug: umeclidinium bromide Inhale 1 puff into the lungs daily.   losartan 100 MG tablet Commonly known as: Cozaar Take 1 tablet (100 mg total) by mouth daily.   mometasone-formoterol 200-5 MCG/ACT Aero Commonly known as: DULERA Inhale 2 puffs into the lungs 2 (two) times daily.   pantoprazole 40 MG tablet Commonly known as: PROTONIX Take 40 mg by mouth daily.   potassium gluconate 595 (99 K) MG Tabs tablet Take 595 mg by mouth 2 (two) times daily.   predniSONE 20 MG tablet Commonly known as: Deltasone Take with breakfast. Take 3 tabs x 3 days, then 2 tabs x 3 days, then 1 tab x 3 days. What changed:   medication strength  additional instructions      No Known Allergies    The results of significant diagnostics from this hospitalization (including imaging, microbiology, ancillary and laboratory) are listed below for reference.    Significant Diagnostic Studies: Dg Chest 2 View  Result Date: 07/18/2018 CLINICAL DATA:  Recurring pneumonia EXAM: CHEST - 2 VIEW COMPARISON:  June 20, 2018 FINDINGS: The heart size and mediastinal contours are within normal limits. Coarsened increased pulmonary markings are identified in both lungs. Mild hazy opacity of the right lung base is unchanged. There is interval developed patchy opacity of the medial left lung base. The visualized skeletal structures are stable. IMPRESSION: Stable mild hazy opacity of right lung base and interval developed patchy opacity of medial left lung base suspicious for pneumonia. Electronically Signed   By: Abelardo Diesel M.D.   On: 07/18/2018 10:58   Dg Chest Port 1 View  Result  Date: 07/31/2018 CLINICAL DATA:  Shortness of breath EXAM: PORTABLE CHEST 1 VIEW COMPARISON:  07/18/2018 FINDINGS: Patchy bilateral lower lobe opacities, suspicious for pneumonia. No pleural effusion or pneumothorax. The heart is normal in size. IMPRESSION: Patchy bilateral lower lobe opacities, suspicious for pneumonia. Electronically Signed   By: Julian Hy M.D.   On: 07/31/2018 03:13    Microbiology: Recent Results (from the past 240 hour(s))  Fecal occult blood, imunochemical([facility])     Status: None   Collection Time: 07/25/18 11:08 AM   Specimen: Stool   STOOL  Result Value Ref Range Status   Fecal Occult Bld Negative Negative Final  SARS Coronavirus 2 (CEPHEID- Performed in [place] hospital lab), Hosp Order     Status: None   Collection Time: 07/31/18  3:26 AM   Specimen: Nasopharyngeal Swab  Result Value Ref Range Status   SARS Coronavirus 2 NEGATIVE NEGATIVE Final    Comment: (NOTE) If result is NEGATIVE SARS-CoV-2 target nucleic acids are NOT DETECTED. The SARS-CoV-2 RNA is generally detectable in upper and lower  respiratory specimens during the acute phase of infection. The lowest  concentration of SARS-CoV-2 viral copies this assay can detect is 250  copies / mL. A negative result does not preclude SARS-CoV-2 infection  and should not be used as the sole basis for treatment or other  patient management decisions.  A negative result may occur with  improper specimen collection / handling, submission of specimen other  than nasopharyngeal swab, presence of viral mutation(s) within the  areas targeted by this assay, and inadequate number of viral copies  (<250 copies / mL). A negative result must be combined with clinical  observations, patient history, and epidemiological information. If result is POSITIVE SARS-CoV-2 target nucleic acids are DETECTED. The SARS-CoV-2 RNA is generally detectable in upper and lower  respiratory specimens dur ing the  acute phase of infection.  Positive  results are indicative of active infection with SARS-CoV-2.  Clinical  correlation with patient history and other diagnostic information is  necessary to determine patient infection status.  Positive results do  not rule out bacterial infection or co-infection with other viruses. If result is PRESUMPTIVE POSTIVE SARS-CoV-2 nucleic acids MAY BE PRESENT.   A presumptive positive result was obtained on the submitted specimen  and confirmed on repeat testing.  While 2019 novel coronavirus  (SARS-CoV-2) nucleic acids may be present in the submitted sample  additional confirmatory testing may be necessary for epidemiological  and / or clinical management purposes  to differentiate between  SARS-CoV-2 and other Sarbecovirus currently known to infect humans.  If clinically indicated additional testing with an alternate test  methodology [phone]) is advised. The SARS-CoV-2 RNA is generally  detectable in upper and lower respiratory sp ecimens during the acute  phase of infection. The expected result is Negative. Fact Sheet for Patients:  StrictlyIdeas.no Fact Sheet for Healthcare Providers: BankingDealers.co.za This test is not yet approved or cleared by the [place] FDA and has been authorized for detection and/or diagnosis of SARS-CoV-2 by FDA under an Emergency Use Authorization (EUA).  This EUA will remain in effect (meaning this test can be used) for the duration of the COVID-19 declaration under Section 564(b)(1) of the Act, 21 U.S.C. section 360bbb-3(b)(1), unless the authorization is terminated or revoked sooner. Performed at [facility], [place] [address], [place], [place] [address]   Culture, blood (Routine X 2) w Reflex to ID Panel     Status: None (Preliminary result)   Collection Time: 07/31/18  4:08 AM   Specimen: BLOOD RIGHT FOREARM  Result Value Ref Range Status   Specimen  Description BLOOD RIGHT FOREARM  Final   Special Requests   Final    BOTTLES DRAWN AEROBIC AND ANAEROBIC Blood Culture results may not be optimal due to an excessive volume of blood received in culture bottles   Culture   Final    NO GROWTH 1 DAY Performed at [facility], [place] [address], [place], [place] [address]    Report Status PENDING  Incomplete  Culture, blood (Routine X 2) w Reflex to ID Panel     Status: None (Preliminary result)   Collection Time: 07/31/18  4:38 AM   Specimen: BLOOD RIGHT HAND  Result Value Ref Range Status   Specimen Description BLOOD RIGHT HAND  Final   Special Requests   Final    BOTTLES DRAWN AEROBIC AND ANAEROBIC Blood Culture results may not be optimal due to an excessive volume of blood received in culture bottles   Culture   Final    NO GROWTH 1 DAY Performed at [facility], [place] [address], [place], [place] [address]    Report Status PENDING  Incomplete     Labs: Basic  Metabolic Panel: Recent Labs  Lab 07/31/18 0240 07/31/18 0247 08/01/18 0344  NA 139 138 138  K 3.5 3.3* 4.9  CL 102  --  103  CO2 23  --  26  GLUCOSE 221*  --  181*  BUN 9  --  15  CREATININE 0.76  --  0.72  CALCIUM 9.0  --  9.3   Liver Function Tests: No results for input(s): AST, ALT, ALKPHOS, BILITOT, PROT, ALBUMIN in the last 168 hours. No results for input(s): LIPASE, AMYLASE in the last 168 hours. No results for input(s): AMMONIA in the last 168 hours. CBC: Recent Labs  Lab 07/31/18 0240 07/31/18 0247 08/01/18 0344  WBC 19.8*  --  17.3*  NEUTROABS 16.2*  --   --   HGB 13.9 13.6 14.1  HCT 41.6 40.0 42.3  MCV 98.6  --  98.6  PLT 240  --  237   Cardiac Enzymes: No results for input(s): CKTOTAL, CKMB, CKMBINDEX, TROPONINI in the last 168 hours. BNP: BNP (last 3 results) Recent Labs    05/17/18 0902 06/02/18 0936 07/31/18 0240  BNP 31.4 38.8 71.0    ProBNP (last 3 results) No results for input(s): PROBNP in the last 8760 hours.  CBG:  No results for input(s): GLUCAP in the last 168 hours.     Signed:  Cristal Ford  [facility] 08/01/2018, 2:39 PM

## 2018-08-03 ENCOUNTER — Ambulatory Visit (INDEPENDENT_AMBULATORY_CARE_PROVIDER_SITE_OTHER): Payer: Self-pay | Admitting: Critical Care Medicine

## 2018-08-03 ENCOUNTER — Encounter: Payer: Self-pay | Admitting: Critical Care Medicine

## 2018-08-03 ENCOUNTER — Other Ambulatory Visit: Payer: Self-pay

## 2018-08-03 VITALS — BP 156/86 | HR 87 | Temp 97.5°F | Resp 17 | Ht 67.0 in | Wt 188.8 lb

## 2018-08-03 DIAGNOSIS — J9621 Acute and chronic respiratory failure with hypoxia: Secondary | ICD-10-CM

## 2018-08-03 DIAGNOSIS — I10 Essential (primary) hypertension: Secondary | ICD-10-CM

## 2018-08-03 DIAGNOSIS — J441 Chronic obstructive pulmonary disease with (acute) exacerbation: Secondary | ICD-10-CM

## 2018-08-03 DIAGNOSIS — J189 Pneumonia, unspecified organism: Secondary | ICD-10-CM

## 2018-08-03 DIAGNOSIS — F1010 Alcohol abuse, uncomplicated: Secondary | ICD-10-CM

## 2018-08-03 DIAGNOSIS — Z87891 Personal history of nicotine dependence: Secondary | ICD-10-CM

## 2018-08-03 DIAGNOSIS — R748 Abnormal levels of other serum enzymes: Secondary | ICD-10-CM

## 2018-08-03 DIAGNOSIS — J449 Chronic obstructive pulmonary disease, unspecified: Secondary | ICD-10-CM

## 2018-08-03 MED ORDER — PREDNISONE 20 MG PO TABS: ORAL_TABLET | ORAL | 0 refills | Status: DC

## 2018-08-03 MED ORDER — VITAMIN B-1 100 MG PO TABS: 100.0000 mg | ORAL_TABLET | Freq: Every day | ORAL | 3 refills | Status: DC

## 2018-08-03 MED ORDER — AEROCHAMBER MV MISC: 0 refills | Status: DC

## 2018-08-03 MED FILL — predniSONE 20 MG TABS: 20 | 40 days supply | Qty: 40 | Fill #0

## 2018-08-03 NOTE — Patient Instructions (Addendum)
When prednisone dose is reduced to 1 daily stay at that dose and get the medication refilled at our pharmacy  Stay on [facility] and Incruse as prescribed  A new spacer device was sent to your [facility]  Begin thiamine 1 tablet daily prescription was sent to our pharmacy  Finish your antibiotics  Reduce your alcohol consumption  I am writing a letter to take you out of work for 2 weeks I am hoping your employer will support that decision  Please work on obtaining living conditions that has air conditioning and is in a more sanitary environment  Follow through on your financial paperwork assistance  Return in 3 weeks to see [name] at [facility]

## 2018-08-03 NOTE — Progress Notes (Signed)
Subjective:    Patient ID: James [name], male    DOB: Jan 20, 1960, 58 y.o.   MRN: [id number]  58 y.o.M with advanced Copd   I saw this patient a week ago and prescribed antibiotics and prednisone for what I perceived to be persisting pneumonia.  The patient had a slight improvement but then worsened and came to the emergency room the end of the week and was admitted for 3 days for COPD exacerbation and lower lobe pneumonia.  Chest x-ray did show persistent infiltrates in the lower lobes.  The patient is now discharged and returns to the office for post hospital follow-up.  Below is the discharge summary Dc summary : Admit date: 07/31/2018 Discharge date: 08/01/2018  Time spent: 45 minutes  Recommendations for Outpatient Follow-up:  Patient will be discharged to home.  Patient will need to follow up with primary care provider within one week of discharge.  Patient should continue medications as prescribed.  Patient should follow a heart healthy diet.   Discharge Diagnoses:  Acute on chronic respiratory failure with hypoxia secondary to COPD Exacerbation and pneumonia Essential hypertension  Discharge Condition: Stable  Diet recommendation: heart healthy  Filed Weights  07/31/18 0231 07/31/18 0602 Weight: 85.3 kg 83.4 kg   History of present illness:  on 07/31/2018 by James [name] a 58 y.o.malewith medical history significant ofCOPD. Patient has had 3 prior admits for COPD / PNA thus far this year, most recently in May.  Patient has been being treated for COPD exacerbation and BLL CAP as outpatient by James [name] with levaquin and prednisone. Just finished levaquin yesterday. Prednisone remained at 40mg  daily with plans to initiate a slow taper.  Despite this, patient developed severe SOB and respiratory distress this evening. EMS called and patient noted to be satting 80% on RA. He is not on chronic O2 for his COPD.  Hospital Course:   Acute on chronic respiratory failure with hypoxia secondary to COPD Exacerbation and pneumonia -Patient noted to have oxygen saturations in the 80s on room air -Chest x-ray noted for pneumonia -Was recently placed on Levaquin and steroids by PCP approximately 1 week ago -On admission, patient continued to have wheezing well into his hospital stay day 1.  Patient currently still has some wheezing however has improved. -Was placed on Solu-Medrol, along with nebulizer treatments, azithromycin and ceftriaxone -Patient did require oxygen upon admission however has been able to ambulate and maintain oxygen saturations in the high 90s on room air. -Will discharge patient with prednisone taper, azithromycin and Ceftin. -Given that patient had oxygen saturations in the 80s on admission, it was thought that he would require several days in the hospital. Patient actually improved quicker than expected.  Essential hypertension -Continue losartan  Since discharge the patient is tried to go back to work but has extreme difficulty in the heat with this.  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    Shortness of Breath This is a chronic problem. The current episode started more than 1 year ago. The problem occurs daily (qhs shortness of breath). The problem has been gradually worsening. Associated symptoms  include chest pain, PND and wheezing. Pertinent negatives include no claudication, fever, headaches, hemoptysis, leg pain, leg swelling, orthopnea, rash, sore throat, sputum production, syncope or vomiting. The symptoms are aggravated by any activity and weather changes. Associated  symptoms comments: Pain radiates to arm Legs go to sleep No dysphagia No gerd . Risk factors include smoking. He has tried beta agonist inhalers, oral steroids and steroid inhalers for the symptoms. The treatment provided moderate relief. His past medical history is significant for COPD and pneumonia. There is no history of asthma, CAD, DVT, a heart failure or PE.    Past Medical History:  Diagnosis Date  . Acute on chronic respiratory failure with hypoxia ([place]) 06/08/2013  . COPD (chronic obstructive pulmonary disease) ([place])   . Pneumonia 04/06/2016  . Pneumothorax    2016, fell from ladder  . Tick bite 06/03/2018     Family History  Problem Relation Age of Onset  . Heart disease Father   . COPD Sister      Social History   Socioeconomic History  . Marital status: Divorced    Spouse name: Not on file  . Number of children: Not on file  . Years of education: Not on file  . Highest education level: Not on file  Occupational History  . Occupation: Dealer  . Occupation: [employer]  . Financial resource strain: Not on file  . Food insecurity    Worry: Not on file    Inability: Not on file  . Transportation needs    Medical: Not on file    Non-medical: Not on file  Tobacco Use  . Smoking status: Former Smoker    Packs/day: 1.50    Years: 40.00    Pack years: 60.00    Types: Cigarettes  . Smokeless tobacco: Current User    Types: Chew  Substance and Sexual Activity  . Alcohol use: Yes    Alcohol/week: 21.0 standard drinks    Types: 21 Cans of beer per week  . Drug use: Yes    Types: Marijuana    Comment: daily   . Sexual activity: Not Currently    Partners: Female  Lifestyle  . Physical activity    Days per week: Not on file    Minutes per session: Not on file  . Stress: Not on file  Relationships  . Social Herbalist on phone: Not on file    Gets together: Not on file    Attends religious service: Not on file    Active member of club  or organization: Not on file    Attends meetings of clubs or organizations: Not on file    Relationship status: Not on file  . Intimate partner violence    Fear of current or ex partner: Not on file    Emotionally abused: Not on file    Physically abused: Not on file    Forced sexual activity: Not on file  Other Topics Concern  . Not on file  Social History Narrative   Lives alone near [place]     No Known Allergies   Outpatient Medications Prior to Visit  Medication Sig Dispense Refill  . albuterol (PROVENTIL HFA;VENTOLIN HFA) 108 (90 Base) MCG/ACT inhaler Inhale 1-2 puffs into the lungs every 6 (six) hours as needed for wheezing or shortness of breath. 1 Inhaler 5  . albuterol (PROVENTIL) (2.5 MG/3ML) 0.083% nebulizer solution Take 3 mLs (2.5 mg total) by nebulization every 6 (six) hours as  needed for wheezing or shortness of breath. 150 mL 1  . azithromycin (ZITHROMAX) 250 MG tablet Take 1 tablet (250 mg total) by mouth daily for 4 days. 4 tablet 0  . cephALEXin (KEFLEX) 500 MG capsule Take 1 capsule (500 mg total) by mouth 2 (two) times daily for 5 days. 10 capsule 0  . folic acid (FOLVITE) 1 MG tablet Take 1 tablet (1 mg total) by mouth daily. 90 tablet 0  . losartan (COZAAR) 100 MG tablet Take 1 tablet (100 mg total) by mouth daily. 30 tablet 3  . mometasone-formoterol (DULERA) 200-5 MCG/ACT AERO Inhale 2 puffs into the lungs 2 (two) times daily. 13 g 2  . pantoprazole (PROTONIX) 40 MG tablet Take 40 mg by mouth daily.    . potassium gluconate 595 (99 K) MG TABS tablet Take 595 mg by mouth 2 (two) times daily.    [name] Respiratory Therapy Supplies (FLUTTER) DEVI Use 4 times daily 1 each 0  . umeclidinium bromide (INCRUSE ELLIPTA) 62.5 MCG/INH AEPB Inhale 1 puff into the lungs daily. 30 each 5  . predniSONE (DELTASONE) 20 MG tablet Take with breakfast. Take 3 tabs x 3 days, then 2 tabs x 3 days, then 1 tab x 3 days. 18 tablet 0   No facility-administered medications prior to visit.       Review of Systems  Constitutional: Negative for fever.  HENT: Negative for sinus pressure, sinus pain, sore throat and trouble swallowing.   Eyes: Negative.   Respiratory: Positive for shortness of breath and wheezing. Negative for hemoptysis and sputum production.   Cardiovascular: Positive for chest pain and PND. Negative for palpitations, orthopnea, claudication, leg swelling and syncope.  Gastrointestinal: Negative for nausea and vomiting.  Genitourinary: Negative.   Musculoskeletal: Negative.   Skin: Negative for rash.  Neurological: Negative for headaches.  Psychiatric/Behavioral: The patient is not nervous/anxious and is not hyperactive.        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  ENT: No lesions,  mouth clear,  oropharynx clear, no postnasal drip  Neck: No JVD, no TMG, no carotid bruits  Lungs: No use of accessory muscles, no dullness to percussion, distant breath sounds with improved breath sounds from the last office visit without wheezes  Cardiovascular: RRR, heart sounds normal, no murmur or gallops, no peripheral edema  Abdomen: soft and NT, no HSM,  BS normal  Musculoskeletal: No deformities, no cyanosis or clubbing  Neuro: alert, non focal  Skin: Warm, no lesions or rashes  7/14 CXR IMPRESSION: Stable mild hazy opacity of right lung base and interval developed patchy opacity of medial left lung base suspicious for pneumonia.  BMP Latest Ref Rng & Units 08/01/2018 07/31/2018 07/31/2018  Glucose 70 - 99 mg/dL  181(H) - 221(H)  BUN 6 - 20 mg/dL 15 - 9  Creatinine 0.61 - 1.24 mg/dL 0.72 - 0.76  BUN/Creat Ratio 9 - 20 - - -  Sodium 135 - 145 mmol/L 138 138 139  Potassium 3.5 - 5.1 mmol/L 4.9 3.3(L) 3.5  Chloride 98 - 111 mmol/L 103 - 102  CO2 22 - 32 mmol/L 26 - 23  Calcium 8.9 - 10.3 mg/dL 9.3 - 9.0   Hepatic Function Panel     Component Value Date/Time   PROT 6.0 06/20/2018 1159   ALBUMIN 4.1 06/20/2018 1159   AST 34 06/20/2018 1159   ALT 37 06/20/2018 1159   ALT 62 (H) 06/23/2016 1226   ALKPHOS 43 06/20/2018 1159   BILITOT 0.4 06/20/2018 1159   BILIDIR 0.12 04/16/2016 1601   IBILI NOT CALCULATED 03/26/2016 1845   CBC Latest Ref Rng & Units 08/01/2018 07/31/2018 07/31/2018  WBC 4.0 - 10.5 K/uL 17.3(H) - 19.8(H)  Hemoglobin 13.0 - 17.0 g/dL 14.1 13.6 13.9  Hematocrit 39.0 - 52.0 % 42.3 40.0 41.6  Platelets 150 - 400 K/uL 237 - 240        Assessment & Plan:  I personally reviewed all images and lab data in the [facility] system as well as any outside material available during this office visit and agree with the  radiology impressions.   COPD exacerbation (HCC) Recurrent COPD exacerbation with community-acquired pneumonia no organism specified  We will have the patient finish current course of Keflex and azithromycin  Will extend prednisone to 1 tablet 10 mg daily after the taper is complete  I went over again with proper HFA technique with the patient he does have a spacer device and he is to use this with the inhalers  He does not require oxygen at this time  On the right him out of work for the next 2 weeks and have instructed his sister to help him find proper housing he will finish his financial application paperwork  Hypertension We will leave blood pressure medicines at current dose level  Acute on chronic respiratory failure with hypoxia ([place]) Acute on chronic hypoxemia now resolved  No oxygen is indicated  CAP (community acquired pneumonia) Community-acquired pneumonia  appears to have improved  Alcohol abuse History of alcohol use ongoing  We will add thiamine 100 mg daily continue folic acid and advised alcohol cessation  Elevated liver enzymes Elevated liver function enzymes secondary to alcohol use Some improvement with recent admission

## 2018-08-03 NOTE — Assessment & Plan Note (Signed)
Elevated liver function enzymes secondary to alcohol use Some improvement with recent admission

## 2018-08-03 NOTE — Assessment & Plan Note (Signed)
Acute on chronic hypoxemia now resolved  No oxygen is indicated

## 2018-08-03 NOTE — Assessment & Plan Note (Signed)
We will leave blood pressure medicines at current dose level

## 2018-08-03 NOTE — Assessment & Plan Note (Signed)
History of alcohol use ongoing  We will add thiamine 100 mg daily continue folic acid and advised alcohol cessation

## 2018-08-03 NOTE — Assessment & Plan Note (Signed)
Recurrent COPD exacerbation with community-acquired pneumonia no organism specified  We will have the patient finish current course of Keflex and azithromycin  Will extend prednisone to 1 tablet 10 mg daily after the taper is complete  I went over again with proper HFA technique with the patient he does have a spacer device and he is to use this with the inhalers  He does not require oxygen at this time  On the right him out of work for the next 2 weeks and have instructed his sister to help him find proper housing he will finish his financial application paperwork

## 2018-08-03 NOTE — Assessment & Plan Note (Signed)
Community-acquired pneumonia appears to have improved

## 2018-08-05 LAB — CULTURE, BLOOD (ROUTINE X 2)
Culture: NO GROWTH
Culture: NO GROWTH

## 2018-08-07 ENCOUNTER — Other Ambulatory Visit: Payer: Self-pay

## 2018-08-07 ENCOUNTER — Ambulatory Visit: Payer: Self-pay

## 2018-08-07 ENCOUNTER — Ambulatory Visit: Payer: Self-pay | Attending: Family Medicine

## 2018-08-09 ENCOUNTER — Emergency Department (HOSPITAL_COMMUNITY): Payer: Medicaid Other

## 2018-08-09 ENCOUNTER — Inpatient Hospital Stay (HOSPITAL_COMMUNITY)
Admission: EM | Admit: 2018-08-09 | Discharge: 2018-08-11 | DRG: 189 | Disposition: A | Discharge status: Home / Self Care | Payer: Medicaid Other | Attending: Internal Medicine | Admitting: Internal Medicine

## 2018-08-09 ENCOUNTER — Encounter (HOSPITAL_COMMUNITY): Payer: Self-pay

## 2018-08-09 ENCOUNTER — Other Ambulatory Visit (HOSPITAL_COMMUNITY): Payer: Self-pay

## 2018-08-09 ENCOUNTER — Other Ambulatory Visit: Payer: Self-pay

## 2018-08-09 DIAGNOSIS — Z79899 Other long term (current) drug therapy: Secondary | ICD-10-CM

## 2018-08-09 DIAGNOSIS — Z8249 Family history of ischemic heart disease and other diseases of the circulatory system: Secondary | ICD-10-CM

## 2018-08-09 DIAGNOSIS — Z87891 Personal history of nicotine dependence: Secondary | ICD-10-CM

## 2018-08-09 DIAGNOSIS — F1210 Cannabis abuse, uncomplicated: Secondary | ICD-10-CM | POA: Diagnosis present

## 2018-08-09 DIAGNOSIS — Z7952 Long term (current) use of systemic steroids: Secondary | ICD-10-CM | POA: Diagnosis not present

## 2018-08-09 DIAGNOSIS — Z20828 Contact with and (suspected) exposure to other viral communicable diseases: Secondary | ICD-10-CM | POA: Diagnosis present

## 2018-08-09 DIAGNOSIS — J470 Bronchiectasis with acute lower respiratory infection: Secondary | ICD-10-CM | POA: Diagnosis present

## 2018-08-09 DIAGNOSIS — J189 Pneumonia, unspecified organism: Secondary | ICD-10-CM | POA: Diagnosis present

## 2018-08-09 DIAGNOSIS — J9621 Acute and chronic respiratory failure with hypoxia: Secondary | ICD-10-CM | POA: Diagnosis present

## 2018-08-09 DIAGNOSIS — J441 Chronic obstructive pulmonary disease with (acute) exacerbation: Secondary | ICD-10-CM | POA: Diagnosis present

## 2018-08-09 DIAGNOSIS — R0902 Hypoxemia: Secondary | ICD-10-CM

## 2018-08-09 DIAGNOSIS — Z825 Family history of asthma and other chronic lower respiratory diseases: Secondary | ICD-10-CM

## 2018-08-09 DIAGNOSIS — J449 Chronic obstructive pulmonary disease, unspecified: Secondary | ICD-10-CM | POA: Diagnosis present

## 2018-08-09 DIAGNOSIS — F1020 Alcohol dependence, uncomplicated: Secondary | ICD-10-CM | POA: Diagnosis present

## 2018-08-09 DIAGNOSIS — I10 Essential (primary) hypertension: Secondary | ICD-10-CM | POA: Diagnosis present

## 2018-08-09 DIAGNOSIS — Z9889 Other specified postprocedural states: Secondary | ICD-10-CM

## 2018-08-09 DIAGNOSIS — F1010 Alcohol abuse, uncomplicated: Secondary | ICD-10-CM | POA: Diagnosis present

## 2018-08-09 DIAGNOSIS — J188 Other pneumonia, unspecified organism: Secondary | ICD-10-CM | POA: Diagnosis present

## 2018-08-09 LAB — RAPID URINE DRUG SCREEN, HOSP PERFORMED
Amphetamines: NOT DETECTED
Barbiturates: NOT DETECTED
Benzodiazepines: NOT DETECTED
Cocaine: NOT DETECTED
Opiates: NOT DETECTED
Tetrahydrocannabinol: NOT DETECTED

## 2018-08-09 LAB — CBC WITH DIFFERENTIAL/PLATELET
Abs Immature Granulocytes: 0.14 10*3/uL — ABNORMAL HIGH (ref 0.00–0.07)
Basophils Absolute: 0 10*3/uL (ref 0.0–0.1)
Basophils Relative: 0 %
Eosinophils Absolute: 0 10*3/uL (ref 0.0–0.5)
Eosinophils Relative: 0 %
HCT: 42.6 % (ref 39.0–52.0)
Hemoglobin: 13.9 g/dL (ref 13.0–17.0)
Immature Granulocytes: 1 %
Lymphocytes Relative: 1 %
Lymphs Abs: 0.3 10*3/uL — ABNORMAL LOW (ref 0.7–4.0)
MCH: 32.4 pg (ref 26.0–34.0)
MCHC: 32.6 g/dL (ref 30.0–36.0)
MCV: 99.3 fL (ref 80.0–100.0)
Monocytes Absolute: 0.8 10*3/uL (ref 0.1–1.0)
Monocytes Relative: 3 %
Neutro Abs: 24.4 10*3/uL — ABNORMAL HIGH (ref 1.7–7.7)
Neutrophils Relative %: 95 %
Platelets: 230 10*3/uL (ref 150–400)
RBC: 4.29 MIL/uL (ref 4.22–5.81)
RDW: 13.1 % (ref 11.5–15.5)
WBC: 25.7 10*3/uL — ABNORMAL HIGH (ref 4.0–10.5)
nRBC: 0 % (ref 0.0–0.2)

## 2018-08-09 LAB — BASIC METABOLIC PANEL
Anion gap: 13 (ref 5–15)
BUN: 14 mg/dL (ref 6–20)
CO2: 27 mmol/L (ref 22–32)
Calcium: 9.4 mg/dL (ref 8.9–10.3)
Chloride: 103 mmol/L (ref 98–111)
Creatinine, Ser: 0.93 mg/dL (ref 0.61–1.24)
GFR calc Af Amer: >60 mL/min (ref >60)
GFR calc non Af Amer: >60 mL/min (ref >60)
Glucose, Bld: 111 mg/dL — ABNORMAL HIGH (ref 70–99)
Potassium: 3.7 mmol/L (ref 3.5–5.1)
Sodium: 143 mmol/L (ref 135–145)

## 2018-08-09 LAB — LACTIC ACID, PLASMA: Lactic Acid, Venous: 2.6 mmol/L (ref 0.5–1.9)

## 2018-08-09 LAB — EXPECTORATED SPUTUM ASSESSMENT W GRAM STAIN, RFLX TO RESP C

## 2018-08-09 LAB — PROTIME-INR
INR: 1.1 (ref 0.8–1.2)
Prothrombin Time: 13.7 seconds (ref 11.4–15.2)

## 2018-08-09 LAB — STREP PNEUMONIAE URINARY ANTIGEN: Strep Pneumo Urinary Antigen: NEGATIVE

## 2018-08-09 LAB — PROCALCITONIN: Procalcitonin: 0.77 ng/mL

## 2018-08-09 LAB — APTT: aPTT: 24 seconds (ref 24–36)

## 2018-08-09 LAB — SARS CORONAVIRUS 2 BY RT PCR (HOSPITAL ORDER, PERFORMED IN ~~LOC~~ HOSPITAL LAB): SARS Coronavirus 2: NEGATIVE

## 2018-08-09 MED ORDER — FOLIC ACID 1 MG PO TABS
1.0000 mg | ORAL_TABLET | Freq: Every day | ORAL | Status: DC
  Administered 2018-08-09 – 2018-08-11 (×3): 1 mg via ORAL
  Filled 2018-08-09 (×3): qty 1

## 2018-08-09 MED ORDER — VANCOMYCIN HCL IN DEXTROSE 1-5 GM/200ML-% IV SOLN: 1000.0000 mg | Freq: Once | INTRAVENOUS | Status: DC

## 2018-08-09 MED ORDER — ENOXAPARIN SODIUM 40 MG/0.4ML ~~LOC~~ SOLN
40.0000 mg | SUBCUTANEOUS | Status: DC
  Administered 2018-08-09 – 2018-08-10 (×2): 40 mg via SUBCUTANEOUS
  Filled 2018-08-09 (×2): qty 0.4

## 2018-08-09 MED ORDER — ALBUTEROL SULFATE (2.5 MG/3ML) 0.083% IN NEBU: 2.5000 mg | INHALATION_SOLUTION | Freq: Four times a day (QID) | RESPIRATORY_TRACT | Status: DC | PRN

## 2018-08-09 MED ORDER — PIPERACILLIN-TAZOBACTAM 3.375 G IVPB 30 MIN: 3.3750 g | Freq: Once | INTRAVENOUS | Status: DC

## 2018-08-09 MED ORDER — LORAZEPAM 2 MG/ML IJ SOLN
1.0000 mg | Freq: Four times a day (QID) | INTRAMUSCULAR | Status: DC | PRN
  Administered 2018-08-09 – 2018-08-10 (×2): 1 mg via INTRAVENOUS
  Filled 2018-08-09 (×2): qty 1

## 2018-08-09 MED ORDER — IOHEXOL 350 MG/ML SOLN
75.0000 mL | Freq: Once | INTRAVENOUS | Status: AC | PRN
  Administered 2018-08-09: 07:00:00 75 mL via INTRAVENOUS

## 2018-08-09 MED ORDER — SODIUM CHLORIDE 0.9 % IV SOLN
INTRAVENOUS | Status: DC | PRN
  Administered 2018-08-09 – 2018-08-10 (×3): 250 mL via INTRAVENOUS

## 2018-08-09 MED ORDER — VANCOMYCIN HCL IN DEXTROSE 750-5 MG/150ML-% IV SOLN
750.0000 mg | Freq: Three times a day (TID) | INTRAVENOUS | Status: DC
  Administered 2018-08-09 – 2018-08-10 (×2): 750 mg via INTRAVENOUS
  Filled 2018-08-09 (×5): qty 150

## 2018-08-09 MED ORDER — VANCOMYCIN HCL 10 G IV SOLR
1500.0000 mg | Freq: Once | INTRAVENOUS | Status: AC
  Administered 2018-08-09: 1500 mg via INTRAVENOUS
  Filled 2018-08-09: qty 1500

## 2018-08-09 MED ORDER — PREDNISONE 20 MG PO TABS
20.0000 mg | ORAL_TABLET | Freq: Every day | ORAL | Status: DC
  Filled 2018-08-09: qty 1

## 2018-08-09 MED ORDER — VITAMIN B-1 100 MG PO TABS
100.0000 mg | ORAL_TABLET | Freq: Every day | ORAL | Status: DC
  Administered 2018-08-09 – 2018-08-11 (×3): 100 mg via ORAL
  Filled 2018-08-09 (×3): qty 1

## 2018-08-09 MED ORDER — PANTOPRAZOLE SODIUM 40 MG PO TBEC
40.0000 mg | DELAYED_RELEASE_TABLET | Freq: Two times a day (BID) | ORAL | Status: DC
  Administered 2018-08-09 – 2018-08-11 (×5): 40 mg via ORAL
  Filled 2018-08-09 (×5): qty 1

## 2018-08-09 MED ORDER — LOSARTAN POTASSIUM 50 MG PO TABS
100.0000 mg | ORAL_TABLET | Freq: Every day | ORAL | Status: DC
  Administered 2018-08-09 – 2018-08-11 (×3): 100 mg via ORAL
  Filled 2018-08-09 (×3): qty 2

## 2018-08-09 MED ORDER — METHYLPREDNISOLONE SODIUM SUCC 125 MG IJ SOLR
60.0000 mg | Freq: Two times a day (BID) | INTRAMUSCULAR | Status: DC
  Administered 2018-08-09 – 2018-08-11 (×4): 60 mg via INTRAVENOUS
  Filled 2018-08-09 (×4): qty 2

## 2018-08-09 MED ORDER — SODIUM CHLORIDE 0.9 % IV SOLN
INTRAVENOUS | Status: DC
  Administered 2018-08-09 – 2018-08-10 (×4): via INTRAVENOUS

## 2018-08-09 MED ORDER — PIPERACILLIN-TAZOBACTAM 3.375 G IVPB
3.3750 g | Freq: Three times a day (TID) | INTRAVENOUS | Status: DC
  Administered 2018-08-09 – 2018-08-10 (×2): 3.375 g via INTRAVENOUS
  Filled 2018-08-09 (×4): qty 50

## 2018-08-09 MED ORDER — SODIUM CHLORIDE 0.9 % IV SOLN
500.0000 mg | Freq: Once | INTRAVENOUS | Status: AC
  Administered 2018-08-09: 500 mg via INTRAVENOUS
  Filled 2018-08-09: qty 500

## 2018-08-09 MED ORDER — GUAIFENESIN ER 600 MG PO TB12
600.0000 mg | ORAL_TABLET | Freq: Two times a day (BID) | ORAL | Status: DC
  Administered 2018-08-09 – 2018-08-11 (×5): 600 mg via ORAL
  Filled 2018-08-09 (×5): qty 1

## 2018-08-09 MED ORDER — LORAZEPAM 1 MG PO TABS
1.0000 mg | ORAL_TABLET | Freq: Four times a day (QID) | ORAL | Status: DC | PRN
  Administered 2018-08-09 – 2018-08-11 (×2): 1 mg via ORAL
  Filled 2018-08-09 (×3): qty 1

## 2018-08-09 MED ORDER — ALBUTEROL SULFATE (2.5 MG/3ML) 0.083% IN NEBU: 3.0000 mL | INHALATION_SOLUTION | Freq: Four times a day (QID) | RESPIRATORY_TRACT | Status: DC | PRN

## 2018-08-09 MED ORDER — DEXAMETHASONE SODIUM PHOSPHATE 10 MG/ML IJ SOLN
10.0000 mg | Freq: Once | INTRAMUSCULAR | Status: AC
  Administered 2018-08-09: 06:00:00 10 mg via INTRAMUSCULAR
  Filled 2018-08-09: qty 1

## 2018-08-09 MED ORDER — SODIUM CHLORIDE 0.9 % IV SOLN
2.0000 g | Freq: Once | INTRAVENOUS | Status: AC
  Administered 2018-08-09: 2 g via INTRAVENOUS
  Filled 2018-08-09: qty 20

## 2018-08-09 MED ORDER — MOMETASONE FURO-FORMOTEROL FUM 200-5 MCG/ACT IN AERO
2.0000 | INHALATION_SPRAY | Freq: Two times a day (BID) | RESPIRATORY_TRACT | Status: DC
  Administered 2018-08-10 – 2018-08-11 (×3): 2 via RESPIRATORY_TRACT
  Filled 2018-08-09 (×2): qty 8.8

## 2018-08-09 MED ORDER — THIAMINE HCL 100 MG/ML IJ SOLN: 100.0000 mg | Freq: Every day | INTRAMUSCULAR | Status: DC

## 2018-08-09 MED ORDER — ADULT MULTIVITAMIN W/MINERALS CH
1.0000 | ORAL_TABLET | Freq: Every day | ORAL | Status: DC
  Administered 2018-08-09 – 2018-08-11 (×3): 1 via ORAL
  Filled 2018-08-09 (×3): qty 1

## 2018-08-09 MED ORDER — ALBUTEROL SULFATE HFA 108 (90 BASE) MCG/ACT IN AERS
8.0000 | INHALATION_SPRAY | Freq: Once | RESPIRATORY_TRACT | Status: AC
  Administered 2018-08-09: 07:00:00 8 via RESPIRATORY_TRACT
  Filled 2018-08-09: qty 6.7

## 2018-08-09 MED ORDER — ALBUTEROL SULFATE HFA 108 (90 BASE) MCG/ACT IN AERS: 8.0000 | INHALATION_SPRAY | Freq: Once | RESPIRATORY_TRACT | Status: DC

## 2018-08-09 MED ORDER — UMECLIDINIUM BROMIDE 62.5 MCG/INH IN AEPB
1.0000 | INHALATION_SPRAY | Freq: Every day | RESPIRATORY_TRACT | Status: DC
  Administered 2018-08-10 – 2018-08-11 (×2): 1 via RESPIRATORY_TRACT
  Filled 2018-08-09 (×2): qty 7

## 2018-08-09 NOTE — ED Notes (Signed)
Lab called critical lab lactic acid 2.6 reported to EDP.

## 2018-08-09 NOTE — ED Provider Notes (Addendum)
[facility] EMERGENCY DEPARTMENT Provider Note   CSN: [id number] Arrival date & time: 08/09/18  0600    History   Chief Complaint Chief Complaint  Patient presents with   Shortness of Breath    HPI James [name] is a 58 y.o. male.     HPI  This is a 58 year old male with history of COPD and recurrent pneumonia who presents with shortness of breath.  Reports acute onset of shortness of breath that woke him up from sleep this morning.  Denies any leg swelling, fever.  Does report cough.  He used his inhalers before bed but not when he woke up short of breath.  He was evaluated by EMS.  Noted to be hypoxic.  Started on magnesium, Solu-Medrol, and given epinephrine.  Patient denies any chest pain.  Patient denies any new sick contacts or COVID exposures.  Recent admission on 7/27 for recurrent pneumonia and COPD.  COVID negative at that time.  Past Medical History:  Diagnosis Date   Acute on chronic respiratory failure with hypoxia ([place]) 06/08/2013   COPD (chronic obstructive pulmonary disease) ([place])    Pneumonia 04/06/2016   Pneumothorax    2016, fell from ladder   Tick bite 06/03/2018    Patient Active Problem List   Diagnosis Date Noted   CAP (community acquired pneumonia) 05/17/2018   COPD exacerbation ([place]) 02/16/2018   Hypertension 05/17/2016   Low back pain radiating to both legs 04/18/2016   History of tobacco abuse 04/06/2016   Alcohol abuse 04/06/2016   Elevated liver enzymes 04/06/2016   COPD mixed type ([place]) 03/26/2016    Past Surgical History:  Procedure Laterality Date   VIDEO ASSISTED THORACOSCOPY (VATS)/THOROCOTOMY Left 06/11/2013   Procedure: VIDEO ASSISTED THORACOSCOPY (VATS)/THOROCOTOMY;  Surgeon: Ivin Poot, MD;  Location: [facility];  Service: Thoracic;  Laterality: Left;   VIDEO BRONCHOSCOPY N/A 06/11/2013   Procedure: VIDEO BRONCHOSCOPY;  Surgeon: Ivin Poot, MD;  Location: [facility] OR;  Service: Thoracic;  Laterality:  N/A;        Home Medications    Prior to Admission medications   Medication Sig Start Date End Date Taking? Authorizing Provider  albuterol (PROVENTIL HFA;VENTOLIN HFA) 108 (90 Base) MCG/ACT inhaler Inhale 1-2 puffs into the lungs every 6 (six) hours as needed for wheezing or shortness of breath. 03/29/18   Scot Jun, FNP  albuterol (PROVENTIL) (2.5 MG/3ML) 0.083% nebulizer solution Take 3 mLs (2.5 mg total) by nebulization every 6 (six) hours as needed for wheezing or shortness of breath. 06/20/18   Scot Jun, FNP  folic acid (FOLVITE) 1 MG tablet Take 1 tablet (1 mg total) by mouth daily. 07/18/18   Scot Jun, FNP  losartan (COZAAR) 100 MG tablet Take 1 tablet (100 mg total) by mouth daily. 04/27/18   [name], MD  mometasone-formoterol (DULERA) 200-5 MCG/ACT AERO Inhale 2 puffs into the lungs 2 (two) times daily. 07/18/18   Scot Jun, FNP  pantoprazole (PROTONIX) 40 MG tablet Take 40 mg by mouth daily.    [provider]  potassium gluconate 595 (99 K) MG TABS tablet Take 595 mg by mouth 2 (two) times daily.    [provider]  predniSONE (DELTASONE) 20 MG tablet Take one daily after taper 08/03/18   [name], MD  Respiratory Therapy Supplies (FLUTTER) DEVI Use 4 times daily 04/27/18   [name], MD  Spacer/Aero-Holding Chambers (AEROCHAMBER MV) inhaler Use as instructed 08/03/18  [name], MD  thiamine (VITAMIN B-1) 100 MG tablet Take 1 tablet (100 mg total) by mouth daily. 08/03/18   [name], MD  umeclidinium bromide (INCRUSE ELLIPTA) 62.5 MCG/INH AEPB Inhale 1 puff into the lungs daily. 07/25/18   [name], MD    Family History Family History  Problem Relation Age of Onset   Heart disease Father    COPD Sister     Social History Social History   Tobacco Use   Smoking status: Former Smoker    Packs/day: 1.50    Years: 40.00    Pack years: 60.00    Types: Cigarettes    Smokeless tobacco: Current User    Types: Chew  Substance Use Topics   Alcohol use: Yes    Alcohol/week: 21.0 standard drinks    Types: 21 Cans of beer per week   Drug use: Yes    Types: Marijuana    Comment: daily      Allergies   Patient has no known allergies.   Review of Systems Review of Systems  Constitutional: Negative for fever.  Respiratory: Positive for cough, shortness of breath and wheezing.   Cardiovascular: Negative for chest pain and leg swelling.  Gastrointestinal: Negative for abdominal pain, nausea and vomiting.  Genitourinary: Negative for dysuria.  Skin: Negative for wound.  Neurological: Negative for headaches.  All other systems reviewed and are negative.    Physical Exam Updated Vital Signs BP 129/80    Pulse (!) 120    Resp (!) 30    SpO2 93%   Physical Exam Vitals signs and nursing note reviewed.  Constitutional:      Appearance: He is well-developed.     Comments: Ill-appearing but nontoxic  HENT:     Head: Normocephalic and atraumatic.  Eyes:     Pupils: Pupils are equal, round, and reactive to light.  Neck:     Musculoskeletal: Neck supple.  Cardiovascular:     Rate and Rhythm: Regular rhythm. Tachycardia present.     Heart sounds: Normal heart sounds. No murmur.  Pulmonary:     Effort: Tachypnea and accessory muscle usage present. No respiratory distress.     Breath sounds: Wheezing present.     Comments: Scant expiratory wheeze, tight, poor air movement Abdominal:     General: Bowel sounds are normal.     Palpations: Abdomen is soft.     Tenderness: There is no abdominal tenderness. There is no rebound.  Musculoskeletal:     Right lower leg: No edema.     Left lower leg: No edema.  Lymphadenopathy:     Cervical: No cervical adenopathy.  Skin:    General: Skin is warm and dry.  Neurological:     Mental Status: He is alert and oriented to person, place, and time.  Psychiatric:        Mood and Affect: Mood normal.       ED Treatments / Results  Labs (all labs ordered are listed, but only abnormal results are displayed) Labs Reviewed  SARS CORONAVIRUS 2 (HOSPITAL ORDER, [facility])  CBC WITH DIFFERENTIAL/PLATELET  BASIC METABOLIC PANEL    EKG EKG Interpretation  Date/Time:  Wednesday August 09 2018 06:06:09 EDT Ventricular Rate:  119 PR Interval:    QRS Duration: 93 QT Interval:  330 QTC Calculation: 465 R Axis:   75 Text Interpretation:  Sinus tachycardia Multiform ventricular premature complexes Anterior infarct, old Confirmed by Thayer Jew [phone]) on 08/09/2018  6:10:23 AM   Radiology No results found.  Procedures Procedures (including critical care time)  CRITICAL CARE Performed by: Merryl Hacker   Total critical care time: 35 minutes  Critical care time was exclusive of separately billable procedures and treating other patients.  Critical care was necessary to treat or prevent imminent or life-threatening deterioration.  Critical care was time spent personally by me on the following activities: development of treatment plan with patient and/or surrogate as well as nursing, discussions with consultants, evaluation of patient's response to treatment, examination of patient, obtaining history from patient or surrogate, ordering and performing treatments and interventions, ordering and review of laboratory studies, ordering and review of radiographic studies, pulse oximetry and re-evaluation of patient's condition.  Medications Ordered in ED Medications  albuterol (VENTOLIN HFA) 108 (90 Base) MCG/ACT inhaler 8 puff (has no administration in time range)  dexamethasone (DECADRON) injection 10 mg (10 mg Intramuscular Given 08/09/18 0625)  albuterol (VENTOLIN HFA) 108 (90 Base) MCG/ACT inhaler 8 puff (8 puffs Inhalation Given 08/09/18 1275)     Initial Impression / Assessment and Plan / ED Course  I have reviewed the triage vital signs and the  nursing notes.  Pertinent labs & imaging results that were available during my care of the patient were reviewed by me and considered in my medical decision making (see chart for details).  Clinical Course as of Aug 09 655  Wed Aug 09, 2018  1700 On recheck after inhaler, patient continues to be very tight.  He is on nasal cannula and has mild tachypnea.  Repeat inhaler dose ordered.   [CH]    Clinical Course User Index [CH] Patrycja Mumpower, Barbette Hair, MD       Patient presents with shortness of breath.  Found to be hypoxic.  Recent hospitalization with COPD and pneumonia.  He has had multiple antibiotic treatments and was discharged with a prednisone taper.  He is tachypneic and tight on my exam.  He is on nasal cannula.  He received magnesium, epinephrine, and Solu-Medrol.  Recent COVID testing has been negative.  Patient was given 8 puffs of an inhaler.  Lab work, chest x-ray obtained.  COVID testing was also reordered.  See clinical course above.  At time of signout, work-up is pending.  If patient does not improve clinically, he will likely admission.  He has acute on chronic respiratory failure.  7:02 AM Chest x-ray shows an asymmetric right base opacity suggesting pneumonia.  Given recent recurrent pneumonias and the fact the patient is afebrile, question other etiology.  He has not recently had any CT imaging of his chest.  I will obtain a CT scan with contrast to further evaluate.  I did obtain blood cultures.  We will hold off on antibiotics until CT results.  Final Clinical Impressions(s) / ED Diagnoses   Final diagnoses:  Hypoxia  COPD exacerbation [facility])    ED Discharge Orders    None       Lovelee Forner, Barbette Hair, MD 08/09/18 0700    Merryl Hacker, MD 08/09/18 0700    Merryl Hacker, MD 08/09/18 [phone]

## 2018-08-09 NOTE — ED Notes (Signed)
Pt is currently in CT.

## 2018-08-09 NOTE — ED Triage Notes (Signed)
Pt arrived via [facility]; pt from home with c/o Res distress; pt sating 88% on RA; H of COPD, woke up at approx 5a with difficulty breathing; Recently released from hosp wit PNA R lung; diminished in all fields; pt wih productive cough (yellowish-green); 125mg  of Solumedrol, 0.3mg  Epi; 150/94; 120; 98% on NRB; 97.9; 24;  pt rec'd lil less than 26ml of Mg enroute

## 2018-08-09 NOTE — ED Provider Notes (Signed)
Blood pressure 129/80, pulse (!) 120, resp. rate (!) 30, SpO2 93 %.  Assuming care from Dr. Dina Rich.  In short, James [name] is a 58 y.o. male with a chief complaint of Shortness of Breath .  Refer to the original H&P for additional details.  The current plan of care is to f/u on CT and nebs if COVID negative.  CTA reviewed with no PE but is showing a right sided multifocal PNA. Starting Abx. Rapid COVID is negative. Suspect bacterial etiology. Continue precautions for now. Patient continues to have new O2 requirement.   09:43 AM   CBC clotted. Will re-draw.   Discussed patient's case with Hospitalist, Dr. Lorin Mercy to request admission. Patient and family (if present) updated with plan. Care transferred to Hospitalist service.  I reviewed all nursing notes, vitals, pertinent old records, EKGs, labs, imaging (as available).     Margette Fast, MD 08/09/18 1047

## 2018-08-09 NOTE — Progress Notes (Addendum)
Pharmacy Antibiotic Note  James [name] is a 58 y.o. male admitted on 08/09/2018 with pneumonia. Pharmacy has been consulted for vancomycin dosing. Scr is WNL.   Plan: Vancomycin 1500mg  IV x 1 then 750mg  IV Q8H F/u renal fxn, C&S, clinical status and peak/trough at [facility] F/u continuation of gram negative coverage    No data recorded.  Recent Labs  Lab 08/09/18 0617  CREATININE 0.93    Estimated Creatinine Clearance: 91.6 mL/min (by C-G formula based on SCr of 0.93 mg/dL).    No Known Allergies  Antimicrobials this admission: Vanc 8/5>> Azithro x 1 8/5 CTX x 1 8/5  Dose adjustments this admission: N/A  Microbiology results: Pending  Thank you for allowing pharmacy to be a part of this patient's care.  Chaundra Abreu, Rande Lawman 08/09/2018 8:48 AM   Addendum: Adding zosyn 3.375gm IV Q8H (4 hr inf)  Salome Arnt, PharmD, BCPS Please see AMION for all pharmacy numbers 08/09/2018 11:12 AM

## 2018-08-09 NOTE — ED Notes (Signed)
RN called lab and lavender top clotted. Will recollect.

## 2018-08-09 NOTE — H&P (Signed)
History and Physical    James [name] TML:[id number] DOB: [birth year] DOA: 08/09/2018  PCP: Scot Jun, FNP Consultants:  [name] - pulmonary Patient coming from:  Home - lives alone; NOK: Sister, [phone]  Chief Complaint:  Respiratory distress  HPI: James [name] is a 58 y.o. male with medical history significant of COPD and ETOH dependence presenting with respiratory distress.  He woke up about 530 this AM and couldn't breathe.  This just keeps happening.  He felt similarly when hospitalized last week.  He has been doing fine until today.  No h/o OSA, not on CPAP.  Not on home O2.  +cough, productive of yellowish sputum.  It is sometimes hard to get the sputum up.  No fever.  +wheezing since his last hospitalization.  Quit smoking in 2014.  [place] non-air-conditioned [place] cabin.  He was hospitalized from 7/27-28 for acute on chronic respiratory failure associated with COPD exacerbation and PNA.  This was his 4th admission this year.  He was seen by [name] on 7/30 with plan for completion of course of Keflex and prednisone, as well as prednisone taper.     ED Course:  Severe COPD, ETOH.  Recurrent PNA, recurrent hospitalizations.  Several days of SOB, new O2 requirement, given 8 puffs of albuterol.  New multifocal PNA on the right.  COVID negative.  Given Rocephin, Azithro, Vanc.  Has not given nebs.  Review of Systems: As per HPI; otherwise review of systems reviewed and negative.   Ambulatory Status:  Ambulates without assistance  Past Medical History:  Diagnosis Date   Acute on chronic respiratory failure with hypoxia ([place]) 06/08/2013   COPD (chronic obstructive pulmonary disease) (HCC)    not on home   Pneumonia 04/06/2016   Pneumothorax    2016, fell from ladder   Tick bite 06/03/2018    Past Surgical History:  Procedure Laterality Date   VIDEO ASSISTED THORACOSCOPY (VATS)/THOROCOTOMY Left 06/11/2013   Procedure: VIDEO ASSISTED THORACOSCOPY  (VATS)/THOROCOTOMY;  Surgeon: Ivin Poot, MD;  Location: [facility] OR;  Service: Thoracic;  Laterality: Left;   VIDEO BRONCHOSCOPY N/A 06/11/2013   Procedure: VIDEO BRONCHOSCOPY;  Surgeon: Ivin Poot, MD;  Location: [facility];  Service: Thoracic;  Laterality: N/A;    Social History   Socioeconomic History   Marital status: Divorced    Spouse name: Not on file   Number of children: Not on file   Years of education: Not on file   Highest education level: Not on file  Occupational History   Occupation: Dealer   Occupation: Sport and exercise psychologist strain: Not on file   Food insecurity    Worry: Not on file    Inability: Not on file   Transportation needs    Medical: Not on file    Non-medical: Not on file  Tobacco Use   Smoking status: Former Smoker    Packs/day: 1.50    Years: 40.00    Pack years: 60.00    Types: Cigarettes    Quit date: 2014    Years since quitting: 6.5   Smokeless tobacco: Current User    Types: Chew  Substance and Sexual Activity   Alcohol use: Yes    Alcohol/week: 28.0 standard drinks    Types: 28 Cans of beer per week    Comment: 4 beers a night; + jitters with not drinking, denies DTs or seizures   Drug use: Yes    Types: Marijuana  Comment: daily; quit using crack and methamphetamine about 5 months ago    Sexual activity: Not Currently    Partners: Female  Lifestyle   Physical activity    Days per week: Not on file    Minutes per session: Not on file   Stress: Not on file  Relationships   Social connections    Talks on phone: Not on file    Gets together: Not on file    Attends religious service: Not on file    Active member of club or organization: Not on file    Attends meetings of clubs or organizations: Not on file    Relationship status: Not on file   Intimate partner violence    Fear of current or ex partner: Not on file    Emotionally abused: Not on file    Physically abused: Not on file     Forced sexual activity: Not on file  Other Topics Concern   Not on file  Social History Narrative   Lives alone near [place]    No Known Allergies  Family History  Problem Relation Age of Onset   Heart disease Father    COPD Sister     Prior to Admission medications   Medication Sig Start Date End Date Taking? Authorizing Provider  albuterol (PROVENTIL HFA;VENTOLIN HFA) 108 (90 Base) MCG/ACT inhaler Inhale 1-2 puffs into the lungs every 6 (six) hours as needed for wheezing or shortness of breath. 03/29/18   Scot Jun, FNP  albuterol (PROVENTIL) (2.5 MG/3ML) 0.083% nebulizer solution Take 3 mLs (2.5 mg total) by nebulization every 6 (six) hours as needed for wheezing or shortness of breath. 06/20/18   Scot Jun, FNP  folic acid (FOLVITE) 1 MG tablet Take 1 tablet (1 mg total) by mouth daily. 07/18/18   Scot Jun, FNP  losartan (COZAAR) 100 MG tablet Take 1 tablet (100 mg total) by mouth daily. 04/27/18   [name], MD  mometasone-formoterol (DULERA) 200-5 MCG/ACT AERO Inhale 2 puffs into the lungs 2 (two) times daily. 07/18/18   Scot Jun, FNP  pantoprazole (PROTONIX) 40 MG tablet Take 40 mg by mouth daily.    [provider]  potassium gluconate 595 (99 K) MG TABS tablet Take 595 mg by mouth 2 (two) times daily.    [provider]  predniSONE (DELTASONE) 20 MG tablet Take one daily after taper 08/03/18   [name], MD  Respiratory Therapy Supplies (FLUTTER) DEVI Use 4 times daily 04/27/18   [name], MD  Spacer/Aero-Holding Chambers (AEROCHAMBER MV) inhaler Use as instructed 08/03/18   [name], MD  thiamine (VITAMIN B-1) 100 MG tablet Take 1 tablet (100 mg total) by mouth daily. 08/03/18   [name], MD  umeclidinium bromide (INCRUSE ELLIPTA) 62.5 MCG/INH AEPB Inhale 1 puff into the lungs daily. 07/25/18   [name], MD    Physical Exam: Vitals:   08/09/18 0700 08/09/18 0915 08/09/18  1045 08/09/18 1100  BP: (!) 157/78 (!) 144/96 (!) 156/97 (!) 146/94  Pulse: (!) 122 96 95 84  Resp: (!) 28 (!) 25 18 (!) 22  SpO2: 92% 97% 95% 93%      General:  Appears calm and comfortable and is NAD; appears much older than stated age  Eyes:   EOMI, normal lids, iris  ENT:  grossly normal hearing, lips & tongue, mmm; edentulous  Neck:  no LAD, masses or thyromegaly  Cardiovascular:  RR with  mild tachycardia, no m/r/g. No LE edema.   Respiratory:   R-sided rhonchi with scattered wheezing.  Mildly increased respiratory effort on 4L [place] O2.  Abdomen:  soft, NT, ND, NABS  Skin:  no rash or induration seen on limited exam  Musculoskeletal:  grossly normal tone BUE/BLE, good ROM, no bony abnormality  Lower extremity:  No LE edema.  Limited foot exam with no ulcerations.  2+ distal pulses.  Psychiatric:  grossly normal mood and affect, speech fluent and appropriate, AOx3  Neurologic:  CN 2-12 grossly intact, moves all extremities in coordinated fashion, sensation intact    Radiological Exams on Admission: Ct Angio Chest Pe W And/or Wo Contrast  Result Date: 08/09/2018 CLINICAL DATA:  Shortness of breath EXAM: CT ANGIOGRAPHY CHEST WITH CONTRAST TECHNIQUE: Multidetector CT imaging of the chest was performed using the standard protocol during bolus administration of intravenous contrast. Multiplanar CT image reconstructions and MIPs were obtained to evaluate the vascular anatomy. CONTRAST:  54mL OMNIPAQUE IOHEXOL 350 MG/ML SOLN COMPARISON:  03/26/2016 FINDINGS: Cardiovascular: Suboptimal bolus density but satisfactory opacification of the pulmonary arteries to the segmental level. No evidence of pulmonary embolism. Normal heart size. No pericardial effusion. Atherosclerotic calcification of the aorta. Mediastinum/Nodes: Negative for adenopathy or mass. Lungs/Pleura: Patchy consolidative and ground-glass opacity in the right lung. Background of hyperinflation with emphysema and airway  thickening. No edema, effusion, or pneumothorax. Central airways are clear. Upper Abdomen: Negative Musculoskeletal: No acute finding Review of the MIP images confirms the above findings. IMPRESSION: 1. Multifocal pneumonia on the right. 2. COPD including emphysema. 3. Negative for pulmonary embolism. Electronically Signed   By: Monte Fantasia M.D.   On: 08/09/2018 07:57   Dg Chest Portable 1 View  Result Date: 08/09/2018 CLINICAL DATA:  Shortness of breath EXAM: PORTABLE CHEST 1 VIEW COMPARISON:  07/01/2018 FINDINGS: Ill-defined right base opacification. Background hyperinflation and apical lucency. Normal heart size and mediastinal contours. No effusion or pneumothorax. Remote bilateral rib fractures. IMPRESSION: 1. Asymmetric right base opacity suggesting pneumonia. 2. Emphysema. Electronically Signed   By: Monte Fantasia M.D.   On: 08/09/2018 06:55    EKG: Independently reviewed.  Sinus tachycardia with rate 119; nonspecific ST changes with no evidence of acute ischemia   Labs on Admission: I have personally reviewed the available labs and imaging studies at the time of the admission.  Pertinent labs:   Glucose 111 WBC 25.7 COVID negative today and also 7/27, 5/29, and 5/13 Blood cultures pending   Assessment/Plan Principal Problem:   Multifocal pneumonia Active Problems:   COPD mixed type ([place])   Alcohol abuse   Hypertension   Marijuana abuse   Acute on chronic respiratory failure with hypoxia due to Multifocal PNA -Patient's cough and shortness of breath plus x-ray findings are consistent with R-sided multifocal PNA; given repetitive hospitalizations for the same, this is concerning for HCAP. -SIRS criteria in this patient includes: Leukocytosis, fever, tachycardia, tachypnea, hypoxia  -Patient does not have hypotension; lactate is pending -While awaiting blood cultures, this appears to be a preseptic condition. -Sepsis protocol initiated -Will admit to telemetry due to:  recurrent failure of outpatient therapy -Treat with Vanc/Zosyn for recurrent PNA; aspiration PNA is also a consideration given his ETOH dependence and so will order a speech therapy evaluation but lesser concern for this as the source -Will trend lactate to ensure improvement -Will order lower respiratory tract procalcitonin level.  Antibiotics would not be indicated for PCT <0.1 and probably should not be used for < 0.25.  >  0.5 indicates infection and >>0.5 indicates more serious disease.  As the procalcitonin level normalizes, it will be reasonable to consider de-escalation of antibiotic coverage. -Albuterol HFA/ Nebulizer prn for SOB and Mucinex for cough -Follow up blood culture x2, sputum culture  -Patient discussed with [name], who knows patient well and saw him recently.  He agrees with above plan and recommended pulmonology consult for consideration of bronchoscopy for possible endobronchial lesion given refractory PNA. -I spoke with Dr. Nelda Marseille and he will consult on the patient.  COPD -Patient with advanced COPD but not on home O2. -No longer smoking cigarettes but continues to smoke daily marijuana (cessation encouraged). -Continue Albuterol HFA and nebs -Continue Dulera and Incruse ellipta -Continue prednisone 20 mg daily for now -Continue flutter valve  ETOH dependence -Patient with chronic ETOH dependence -He is at high risk for complications of withdrawal including seizures, DTs -Will observe -CIWA protocol  HTN -Continue Cozaar  Marijuana abuse -Cessation encouraged; this should be encouraged on an ongoing basis -UDS ordered   Note: This patient has been tested and is negative for the novel coronavirus COVID-19.  DVT prophylaxis:  Lovenox Code Status:  Full - confirmed with patient Family Communication: None present; declines having me call family  Disposition Plan:  Home once clinically improved Consults called: PCCM Admission status: Admit - It is my  clinical opinion that admission to INPATIENT is reasonable and necessary because of the expectation that this patient will require hospital care that crosses at least 2 midnights to treat this condition based on the medical complexity of the problems presented.  Given the aforementioned information, the predictability of an adverse outcome is felt to be significant.   Karmen Bongo MD [facility]   How to contact the [facility] Attending or Consulting provider [place] or covering provider during after hours [place], for this patient?  1. Check the care team in [facility] and look for a) attending/consulting [facility] provider listed and b) the [facility] team listed 2. Log into [email or web address] and use [place]'s universal password to access. If you do not have the password, please contact the hospital operator. 3. Locate the [facility] provider you are looking for under [facility] and page to a number that you can be directly reached. 4. If you still have difficulty reaching the provider, please page the [facility] (Director on Call) for the Hospitalists listed on amion for assistance.   08/09/2018, 12:06 PM

## 2018-08-09 NOTE — ED Notes (Signed)
ED TO INPATIENT HANDOFF REPORT  ED Nurse Name and Phone #: Marya Amsler RN [phone]  S Name/Age/Gender [name] 58 y.o. male Room/Bed: 038C/038C  Code Status   Code Status: Full Code  Home/SNF/Other Home Patient oriented to: self, place, time and situation Is this baseline? Yes   Triage Complete: Triage complete  Chief Complaint sick  Triage Note Pt arrived via [facility]; pt from home with c/o Res distress; pt sating 88% on RA; H of COPD, woke up at approx 5a with difficulty breathing; Recently released from hosp wit PNA R lung; diminished in all fields; pt wih productive cough (yellowish-green); 125mg  of Solumedrol, 0.3mg  Epi; 150/94; 120; 98% on NRB; 97.9; 24;  pt rec'd lil less than 30ml of Mg enroute    Allergies No Known Allergies  Level of Care/Admitting Diagnosis ED Disposition    ED Disposition Condition [place]: [place] [[id number]]  Level of Care: Telemetry Medical [104]  Covid Evaluation: Confirmed COVID Negative  Diagnosis: Multifocal pneumonia [[id number]]  Admitting Physician: Karmen Bongo [[id number]]  Attending Physician: Karmen Bongo [[id number]]  Estimated length of stay: 3 - 4 days  Certification:: I certify this patient will need inpatient services for at least 2 midnights  PT Class (Do Not Modify): Inpatient [101]  PT Acc Code (Do Not Modify): Private [1]       B Medical/Surgery History Past Medical History:  Diagnosis Date  . Acute on chronic respiratory failure with hypoxia ([place]) 06/08/2013  . COPD (chronic obstructive pulmonary disease) ([place])    not on home  . Pneumonia 04/06/2016  . Pneumothorax    2016, fell from ladder  . Tick bite 06/03/2018   Past Surgical History:  Procedure Laterality Date  . VIDEO ASSISTED THORACOSCOPY (VATS)/THOROCOTOMY Left 06/11/2013   Procedure: VIDEO ASSISTED THORACOSCOPY (VATS)/THOROCOTOMY;  Surgeon: Ivin Poot, MD;  Location: [facility];  Service: Thoracic;  Laterality: Left;  [name]  VIDEO BRONCHOSCOPY N/A 06/11/2013   Procedure: VIDEO BRONCHOSCOPY;  Surgeon: Ivin Poot, MD;  Location: [facility] OR;  Service: Thoracic;  Laterality: N/A;     A IV Location/Drains/Wounds Patient Lines/Drains/Airways Status   Active Line/Drains/Airways    Name:   Placement date:   Placement time:   Site:   Days:   Peripheral IV 08/09/18 Right Antecubital   08/09/18    0610    Antecubital   less than 1          Intake/Output Last 24 hours No intake or output data in the 24 hours ending 08/09/18 1414  Labs/Imaging Results for orders placed or performed during the hospital encounter of 08/09/18 (from the past 48 hour(s))  Basic metabolic panel     Status: Abnormal   Collection Time: 08/09/18  6:17 AM  Result Value Ref Range   Sodium 143 135 - 145 mmol/L   Potassium 3.7 3.5 - 5.1 mmol/L   Chloride 103 98 - 111 mmol/L   CO2 27 22 - 32 mmol/L   Glucose, Bld 111 (H) 70 - 99 mg/dL   BUN 14 6 - 20 mg/dL   Creatinine, Ser 0.93 0.61 - 1.24 mg/dL   Calcium 9.4 8.9 - 10.3 mg/dL   GFR calc non Af Amer >60 >60 mL/min   GFR calc Af Amer >60 >60 mL/min   Anion gap 13 5 - 15    Comment: Performed at [facility], [place] [address] [place], [place] [address]  SARS Coronavirus 2 [facility] order, Performed in  [facility] hospital lab) Nasopharyngeal Nasopharyngeal Swab     Status: None   Collection Time: 08/09/18  6:56 AM   Specimen: Nasopharyngeal Swab  Result Value Ref Range   SARS Coronavirus 2 NEGATIVE NEGATIVE    Comment: (NOTE) If result is NEGATIVE SARS-CoV-2 target nucleic acids are NOT DETECTED. The SARS-CoV-2 RNA is generally detectable in upper and lower  respiratory specimens during the acute phase of infection. The lowest  concentration of SARS-CoV-2 viral copies this assay can detect is 250  copies / mL. A negative result does not preclude SARS-CoV-2 infection  and should not be used as the sole basis for treatment or other  patient management decisions.  A negative result  may occur with  improper specimen collection / handling, submission of specimen other  than nasopharyngeal swab, presence of viral mutation(s) within the  areas targeted by this assay, and inadequate number of viral copies  (<250 copies / mL). A negative result must be combined with clinical  observations, patient history, and epidemiological information. If result is POSITIVE SARS-CoV-2 target nucleic acids are DETECTED. The SARS-CoV-2 RNA is generally detectable in upper and lower  respiratory specimens dur ing the acute phase of infection.  Positive  results are indicative of active infection with SARS-CoV-2.  Clinical  correlation with patient history and other diagnostic information is  necessary to determine patient infection status.  Positive results do  not rule out bacterial infection or co-infection with other viruses. If result is PRESUMPTIVE POSTIVE SARS-CoV-2 nucleic acids MAY BE PRESENT.   A presumptive positive result was obtained on the submitted specimen  and confirmed on repeat testing.  While 2019 novel coronavirus  (SARS-CoV-2) nucleic acids may be present in the submitted sample  additional confirmatory testing may be necessary for epidemiological  and / or clinical management purposes  to differentiate between  SARS-CoV-2 and other Sarbecovirus currently known to infect humans.  If clinically indicated additional testing with an alternate test  methodology [phone]) is advised. The SARS-CoV-2 RNA is generally  detectable in upper and lower respiratory sp ecimens during the acute  phase of infection. The expected result is Negative. Fact Sheet for Patients:  StrictlyIdeas.no Fact Sheet for Healthcare Providers: BankingDealers.co.za This test is not yet approved or cleared by the [place] FDA and has been authorized for detection and/or diagnosis of SARS-CoV-2 by FDA under an Emergency Use Authorization (EUA).   This EUA will remain in effect (meaning this test can be used) for the duration of the COVID-19 declaration under Section 564(b)(1) of the Act, 21 U.S.C. section 360bbb-3(b)(1), unless the authorization is terminated or revoked sooner. Performed at [facility], [place] [address], [place], [place] [address]   APTT     Status: None   Collection Time: 08/09/18  8:22 AM  Result Value Ref Range   aPTT 24 24 - 36 seconds    Comment: Performed at [place] [address], [place], [place] [address]  CBC with Differential     Status: Abnormal   Collection Time: 08/09/18  9:44 AM  Result Value Ref Range   WBC 25.7 (H) 4.0 - 10.5 K/uL   RBC 4.29 4.22 - 5.81 MIL/uL   Hemoglobin 13.9 13.0 - 17.0 g/dL   HCT 42.6 39.0 - 52.0 %   MCV 99.3 80.0 - 100.0 fL   MCH 32.4 26.0 - 34.0 pg   MCHC 32.6 30.0 - 36.0 g/dL   RDW 13.1 11.5 - 15.5 %  Platelets 230 150 - 400 K/uL   nRBC 0.0 0.0 - 0.2 %   Neutrophils Relative % 95 %   Neutro Abs 24.4 (H) 1.7 - 7.7 K/uL   Lymphocytes Relative 1 %   Lymphs Abs 0.3 (L) 0.7 - 4.0 K/uL   Monocytes Relative 3 %   Monocytes Absolute 0.8 0.1 - 1.0 K/uL   Eosinophils Relative 0 %   Eosinophils Absolute 0.0 0.0 - 0.5 K/uL   Basophils Relative 0 %   Basophils Absolute 0.0 0.0 - 0.1 K/uL   Immature Granulocytes 1 %   Abs Immature Granulocytes 0.14 (H) 0.00 - 0.07 K/uL    Comment: Performed at [place] [address] [place], [place] [address]  Lactic acid, plasma     Status: Abnormal   Collection Time: 08/09/18  1:20 PM  Result Value Ref Range   Lactic Acid, Venous 2.6 (HH) 0.5 - 1.9 mmol/L    Comment: CRITICAL RESULT CALLED TO, READ BACK BY AND VERIFIED WITHTobey Grim RN [phone] BY A BENNETT Performed at [facility], [place] [address], [place], [place] [address]   Protime-INR     Status: None   Collection Time: 08/09/18  1:20 PM  Result Value Ref Range   Prothrombin Time 13.7 11.4 - 15.2 seconds   INR 1.1 0.8 - 1.2     Comment: (NOTE) INR goal varies based on device and disease states. Performed at [facility], [place] [address], [place], [place] [address]   Procalcitonin - Baseline     Status: None   Collection Time: 08/09/18  1:20 PM  Result Value Ref Range   Procalcitonin 0.77 ng/mL    Comment:        Interpretation: PCT > 0.5 ng/mL and <= 2 ng/mL: Systemic infection (sepsis) is possible, but other conditions are known to elevate PCT as well. (NOTE)       Sepsis PCT Algorithm           Lower Respiratory Tract                                      Infection PCT Algorithm    ----------------------------     ----------------------------         PCT < 0.25 ng/mL                PCT < 0.10 ng/mL         Strongly encourage             Strongly discourage   discontinuation of antibiotics    initiation of antibiotics    ----------------------------     -----------------------------       PCT 0.25 - 0.50 ng/mL            PCT 0.10 - 0.25 ng/mL               OR       >80% decrease in PCT            Discourage initiation of                                            antibiotics      Encourage discontinuation           of  antibiotics    ----------------------------     -----------------------------         PCT >= 0.50 ng/mL              PCT 0.26 - 0.50 ng/mL                AND       <80% decrease in PCT             Encourage initiation of                                             antibiotics       Encourage continuation           of antibiotics    ----------------------------     -----------------------------        PCT >= 0.50 ng/mL                  PCT > 0.50 ng/mL               AND         increase in PCT                  Strongly encourage                                      initiation of antibiotics    Strongly encourage escalation           of antibiotics                                     -----------------------------                                           PCT <= 0.25 ng/mL                                                  OR                                        > 80% decrease in PCT                                     Discontinue / Do not initiate                                             antibiotics Performed at [facility], [address] [place], [place] [address]    Ct Angio Chest Pe W And/or Wo Contrast  Result Date: 08/09/2018 CLINICAL DATA:  Shortness of breath EXAM: CT ANGIOGRAPHY CHEST WITH CONTRAST TECHNIQUE: Multidetector CT imaging of the chest  was performed using the standard protocol during bolus administration of intravenous contrast. Multiplanar CT image reconstructions and MIPs were obtained to evaluate the vascular anatomy. CONTRAST:  40mL OMNIPAQUE IOHEXOL 350 MG/ML SOLN COMPARISON:  03/26/2016 FINDINGS: Cardiovascular: Suboptimal bolus density but satisfactory opacification of the pulmonary arteries to the segmental level. No evidence of pulmonary embolism. Normal heart size. No pericardial effusion. Atherosclerotic calcification of the aorta. Mediastinum/Nodes: Negative for adenopathy or mass. Lungs/Pleura: Patchy consolidative and ground-glass opacity in the right lung. Background of hyperinflation with emphysema and airway thickening. No edema, effusion, or pneumothorax. Central airways are clear. Upper Abdomen: Negative Musculoskeletal: No acute finding Review of the MIP images confirms the above findings. IMPRESSION: 1. Multifocal pneumonia on the right. 2. COPD including emphysema. 3. Negative for pulmonary embolism. Electronically Signed   By: Monte Fantasia M.D.   On: 08/09/2018 07:57   Dg Chest Portable 1 View  Result Date: 08/09/2018 CLINICAL DATA:  Shortness of breath EXAM: PORTABLE CHEST 1 VIEW COMPARISON:  07/01/2018 FINDINGS: Ill-defined right base opacification. Background hyperinflation and apical lucency. Normal heart size and mediastinal contours. No effusion or pneumothorax. Remote bilateral rib fractures. IMPRESSION: 1.  Asymmetric right base opacity suggesting pneumonia. 2. Emphysema. Electronically Signed   By: Monte Fantasia M.D.   On: 08/09/2018 06:55    Pending Labs Unresulted Labs (From admission, onward)    Start     Ordered   08/10/18 2841  Basic metabolic panel  Tomorrow morning,   R     08/09/18 1109   08/10/18 0500  CBC WITH DIFFERENTIAL  Tomorrow morning,   R     08/09/18 1109   08/10/18 0500  Procalcitonin  Daily,   R     08/09/18 1313   08/09/18 1313  Strep pneumoniae urinary antigen  Once,   STAT     08/09/18 1312   08/09/18 1313  Legionella Pneumophila Serogp 1 Ur Ag  Once,   STAT     08/09/18 1312   08/09/18 1210  Lactic acid, plasma  STAT Now then every 2 hours,   STAT     08/09/18 1210   08/09/18 1208  Urine rapid drug screen (hosp performed)  ONCE - STAT,   STAT     08/09/18 1210   08/09/18 1107  Culture, sputum-assessment  Once,   R    Question:  Patient immune status  Answer:  Normal   08/09/18 1109   08/09/18 0702  Blood culture (routine x 2)  BLOOD CULTURE X 2,   STAT     08/09/18 0702   08/09/18 0617  CBC with Differential  ONCE - STAT,   STAT     08/09/18 0617          Vitals/Pain Today's Vitals   08/09/18 1045 08/09/18 1100 08/09/18 1230 08/09/18 1330  BP: (!) 156/97 (!) 146/94 (!) 146/92 130/76  Pulse: 95 84 84 76  Resp: 18 (!) 22 20 17   SpO2: 95% 93% 97% 97%  PainSc:   0-No pain     Isolation Precautions No active isolations  Medications Medications  vancomycin (VANCOCIN) 1,500 mg in sodium chloride 0.9 % 500 mL IVPB (1,500 mg Intravenous New Bag/Given 08/09/18 1232)  vancomycin (VANCOCIN) IVPB 750 mg/150 ml premix (has no administration in time range)  losartan (COZAAR) tablet 100 mg (100 mg Oral Given 08/09/18 1343)  pantoprazole (PROTONIX) EC tablet 40 mg (40 mg Oral Given 08/09/18 1343)  albuterol (PROVENTIL) (2.5 MG/3ML) 0.083% nebulizer solution 3 mL (has no administration  in time range)  mometasone-formoterol (DULERA) 200-5 MCG/ACT inhaler 2 puff (2  puffs Inhalation Not Given 08/09/18 1352)  umeclidinium bromide (INCRUSE ELLIPTA) 62.5 MCG/INH 1 puff (1 puff Inhalation Not Given 08/09/18 1352)  enoxaparin (LOVENOX) injection 40 mg (has no administration in time range)  0.9 %  sodium chloride infusion ( Intravenous New Bag/Given 08/09/18 1342)  LORazepam (ATIVAN) tablet 1 mg ( Oral See Alternative 08/09/18 1359)    Or  LORazepam (ATIVAN) injection 1 mg (1 mg Intravenous Given 08/09/18 1359)  thiamine (VITAMIN B-1) tablet 100 mg (100 mg Oral Given 08/09/18 1343)    Or  thiamine (B-1) injection 100 mg ( Intravenous See Alternative 08/05/48 5397)  folic acid (FOLVITE) tablet 1 mg (1 mg Oral Given 08/09/18 1344)  multivitamin with minerals tablet 1 tablet (1 tablet Oral Given 08/09/18 1344)  piperacillin-tazobactam (ZOSYN) IVPB 3.375 g (has no administration in time range)  piperacillin-tazobactam (ZOSYN) IVPB 3.375 g (has no administration in time range)  guaiFENesin (MUCINEX) 12 hr tablet 600 mg (600 mg Oral Given 08/09/18 1344)  methylPREDNISolone sodium succinate (SOLU-MEDROL) 125 mg/2 mL injection 60 mg (has no administration in time range)  dexamethasone (DECADRON) injection 10 mg (10 mg Intramuscular Given 08/09/18 0625)  albuterol (VENTOLIN HFA) 108 (90 Base) MCG/ACT inhaler 8 puff (8 puffs Inhalation Given 08/09/18 0634)  iohexol (OMNIPAQUE) 350 MG/ML injection 75 mL (75 mLs Intravenous Contrast Given 08/09/18 0726)  cefTRIAXone (ROCEPHIN) 2 g in sodium chloride 0.9 % 100 mL IVPB (0 g Intravenous Stopped 08/09/18 1000)  azithromycin (ZITHROMAX) 500 mg in sodium chloride 0.9 % 250 mL IVPB (0 mg Intravenous Stopped 08/09/18 1229)    Mobility walks Low fall risk   Focused Assessments    R Recommendations: See Admitting Provider Note  Report given to:   Additional Notes:

## 2018-08-09 NOTE — ED Notes (Signed)
Lunch Tray Ordered @ 1355-per Marya Amsler, RN called by Levada Dy

## 2018-08-09 NOTE — Consult Note (Signed)
Pulmonary Consult Note  Date of Service: 08/09/18 Consulting Provider: Lorin Mercy CC: SOB  HPI: 58 year old former smoker with COPD having issues with recurrent admissions for AECOPD vs. PNA this year. Has been treated with high dose steroids and various antibiotics since these admissions with initial improvement but always returns with [facility] 3-4 dyspnea, hypoxemia, cough with intermittent production of white/yellow sputum.  He has progressive R infiltrates as well during this time.  Follows with [name] as OP.  Most recent abx has been keflex and azithromycin.  Former smoker.  Nothing recent. No sick contacts.  No aspiration symptoms.  Works as [employer] administrator. Baseline not on O2, MMRC 1 dyspnea.  Do not see any PFTs in system.    Med Hx COPD Former smoker EtOH abuse  Surg Hx Prior lung surgery on L due to trauma  Social Hx 40+ pack year smoker quit in 2015  Family Hx Multiple family members with COPD  ROS  Positive Symptoms in bold:  Constitutional fevers, chills, weight loss, fatigue, anorexia, malaise  Eyes decreased vision, double vision, eye irritation  Ears, Nose, Mouth, Throat sore throat, trouble swallowing, sinus congestion  Cardiovascular chest pain, paroxysmal nocturnal dyspnea, lower ext edema, palpitations   Respiratory SOB, cough, DOE, hemoptysis, wheezing  Gastrointestinal nausea, vomiting, diarrhea  Genitourinary burning with urination, trouble urinating  Musculoskeletal joint aches, joint swelling, back pain  Integumentary  rashes, skin lesions  Neurological focal weakness, focal numbness, trouble speaking, headaches  Psychiatric depression, anxiety, confusion  Endocrine polyuria, polydipsia, cold intolerance, heat intolerance  Hematologic abnormal bruising, abnormal bleeding, unexplained nose bleeds  Allergic/Immunologic recurrent infections, hives, swollen lymph nodes    Phys Exam GEN: middle aged man in NAD HEENT: MMM, poor dentition, no thrush CV: RRR, ext  warm PULM: severely diminished BL with prolonged expiratory phase, able to speak in full sentences GI: Soft, +BS EXT: No edema NEURO: Moves all 4 ext to command PSYCH: RASS 0, AOx3 SKIN: flushed facial skin c/w chronic alcohol use  Studies CTA chest- no PE, severe emphysema, patchy R infiltrates in all 3 lobes  WBC up, BMP/CBC otherwise okay  Assessment Recurrent admissions for SOB and AECOPD symptoms in setting of progressive R lung infiltrates refractory to abx and steroids.  If he cannot expectorate sputum warrants bronch/BAL/brush looking for atypical infections. Will consider Tbbx to r/o malignancy if patient is willing to accept risks of PTX.  EtOH abuse.  Former smoker.  Plan - Try to expectorate sputum, if unable proceed with bronch - NPO midnight - Solumedrol and HCAP + azithromycin coverage fine for now - LABA/LAMA/ICS as ordered - Flutter, OOB - Wean O2 to maintain sats > 88% - Urine strep/legionella - Will need close OP f/u and imaging  Erskine Emery MD PCCM

## 2018-08-10 ENCOUNTER — Encounter (HOSPITAL_COMMUNITY)
Admission: EM | Disposition: A | Discharge status: Home / Self Care | Payer: Self-pay | Source: Home / Self Care | Attending: Internal Medicine

## 2018-08-10 ENCOUNTER — Inpatient Hospital Stay (HOSPITAL_COMMUNITY): Payer: Medicaid Other

## 2018-08-10 ENCOUNTER — Ambulatory Visit: Payer: Self-pay | Admitting: Critical Care Medicine

## 2018-08-10 ENCOUNTER — Encounter (HOSPITAL_COMMUNITY): Payer: Self-pay | Admitting: Respiratory Therapy

## 2018-08-10 DIAGNOSIS — F1010 Alcohol abuse, uncomplicated: Secondary | ICD-10-CM

## 2018-08-10 DIAGNOSIS — I10 Essential (primary) hypertension: Secondary | ICD-10-CM

## 2018-08-10 DIAGNOSIS — F1210 Cannabis abuse, uncomplicated: Secondary | ICD-10-CM

## 2018-08-10 HISTORY — PX: VIDEO BRONCHOSCOPY: SHX5072

## 2018-08-10 LAB — BASIC METABOLIC PANEL WITH GFR
Anion gap: 12 (ref 5–15)
BUN: 17 mg/dL (ref 6–20)
CO2: 26 mmol/L (ref 22–32)
Calcium: 8.7 mg/dL — ABNORMAL LOW (ref 8.9–10.3)
Chloride: 99 mmol/L (ref 98–111)
Creatinine, Ser: 0.78 mg/dL (ref 0.61–1.24)
GFR calc Af Amer: >60 mL/min
GFR calc non Af Amer: >60 mL/min
Glucose, Bld: 184 mg/dL — ABNORMAL HIGH (ref 70–99)
Potassium: 4.2 mmol/L (ref 3.5–5.1)
Sodium: 137 mmol/L (ref 135–145)

## 2018-08-10 LAB — CBC WITH DIFFERENTIAL/PLATELET
Abs Immature Granulocytes: 0.07 10*3/uL (ref 0.00–0.07)
Basophils Absolute: 0 10*3/uL (ref 0.0–0.1)
Basophils Relative: 0 %
Eosinophils Absolute: 0 10*3/uL (ref 0.0–0.5)
Eosinophils Relative: 0 %
HCT: 40.6 % (ref 39.0–52.0)
Hemoglobin: 13.5 g/dL (ref 13.0–17.0)
Immature Granulocytes: 0 %
Lymphocytes Relative: 4 %
Lymphs Abs: 0.7 10*3/uL (ref 0.7–4.0)
MCH: 33.3 pg (ref 26.0–34.0)
MCHC: 33.3 g/dL (ref 30.0–36.0)
MCV: 100.2 fL — ABNORMAL HIGH (ref 80.0–100.0)
Monocytes Absolute: 0.5 10*3/uL (ref 0.1–1.0)
Monocytes Relative: 2 %
Neutro Abs: 18.6 10*3/uL — ABNORMAL HIGH (ref 1.7–7.7)
Neutrophils Relative %: 94 %
Platelets: 215 10*3/uL (ref 150–400)
RBC: 4.05 MIL/uL — ABNORMAL LOW (ref 4.22–5.81)
RDW: 13.1 % (ref 11.5–15.5)
WBC: 19.9 10*3/uL — ABNORMAL HIGH (ref 4.0–10.5)
nRBC: 0 % (ref 0.0–0.2)

## 2018-08-10 LAB — BODY FLUID CELL COUNT WITH DIFFERENTIAL
Eos, Fluid: 0 %
Lymphs, Fluid: 2 %
Monocyte-Macrophage-Serous Fluid: 6 % — ABNORMAL LOW (ref 50–90)
Neutrophil Count, Fluid: 92 % — ABNORMAL HIGH (ref 0–25)
Total Nucleated Cell Count, Fluid: 535 cu mm (ref 0–1000)

## 2018-08-10 LAB — LEGIONELLA PNEUMOPHILA SEROGP 1 UR AG: L. pneumophila Serogp 1 Ur Ag: NEGATIVE

## 2018-08-10 LAB — PROCALCITONIN: Procalcitonin: 0.52 ng/mL

## 2018-08-10 SURGERY — BRONCHOSCOPY, WITH FLUOROSCOPY
Anesthesia: Moderate Sedation

## 2018-08-10 MED ORDER — VANCOMYCIN HCL IN DEXTROSE 750-5 MG/150ML-% IV SOLN
750.0000 mg | Freq: Three times a day (TID) | INTRAVENOUS | Status: DC
  Administered 2018-08-10 – 2018-08-11 (×3): 750 mg via INTRAVENOUS
  Filled 2018-08-10 (×4): qty 150

## 2018-08-10 MED ORDER — FENTANYL CITRATE (PF) 100 MCG/2ML IJ SOLN
INTRAMUSCULAR | Status: AC
  Filled 2018-08-10: qty 4

## 2018-08-10 MED ORDER — PHENYLEPHRINE HCL 0.25 % NA SOLN
NASAL | Status: DC | PRN
  Administered 2018-08-10: 2 via NASAL

## 2018-08-10 MED ORDER — PHENYLEPHRINE HCL 0.25 % NA SOLN
1.0000 | Freq: Four times a day (QID) | NASAL | Status: DC | PRN
  Filled 2018-08-10: qty 15

## 2018-08-10 MED ORDER — PIPERACILLIN-TAZOBACTAM 3.375 G IVPB
3.3750 g | Freq: Three times a day (TID) | INTRAVENOUS | Status: DC
  Administered 2018-08-10 – 2018-08-11 (×3): 3.375 g via INTRAVENOUS
  Filled 2018-08-10 (×4): qty 50

## 2018-08-10 MED ORDER — LIDOCAINE HCL 1 % IJ SOLN
INTRAMUSCULAR | Status: DC | PRN
  Administered 2018-08-10: 6 mL via RESPIRATORY_TRACT

## 2018-08-10 MED ORDER — LIDOCAINE HCL URETHRAL/MUCOSAL 2 % EX GEL
CUTANEOUS | Status: DC | PRN
  Administered 2018-08-10: 1

## 2018-08-10 MED ORDER — BUTAMBEN-TETRACAINE-BENZOCAINE 2-2-14 % EX AERO: 1.0000 | INHALATION_SPRAY | Freq: Once | CUTANEOUS | Status: DC

## 2018-08-10 MED ORDER — SODIUM CHLORIDE 0.9 % IV SOLN
INTRAVENOUS | Status: DC
  Administered 2018-08-10: 10:00:00 via INTRAVENOUS

## 2018-08-10 MED ORDER — LIDOCAINE HCL 2 % EX GEL
1.0000 "application " | Freq: Once | CUTANEOUS | Status: DC
  Filled 2018-08-10: qty 4250

## 2018-08-10 MED ORDER — MIDAZOLAM HCL (PF) 5 MG/ML IJ SOLN
INTRAMUSCULAR | Status: AC
  Filled 2018-08-10: qty 2

## 2018-08-10 MED ORDER — FENTANYL CITRATE (PF) 100 MCG/2ML IJ SOLN
INTRAMUSCULAR | Status: DC | PRN
  Administered 2018-08-10: 50 ug via INTRAVENOUS
  Administered 2018-08-10 (×2): 25 ug via INTRAVENOUS

## 2018-08-10 MED ORDER — MIDAZOLAM HCL (PF) 10 MG/2ML IJ SOLN
INTRAMUSCULAR | Status: DC | PRN
  Administered 2018-08-10: 1 mg via INTRAVENOUS
  Administered 2018-08-10: 2 mg via INTRAVENOUS
  Administered 2018-08-10: 1 mg via INTRAVENOUS

## 2018-08-10 NOTE — Progress Notes (Signed)
08/10/18 Pulmonary Note CC: SOB S: No events overnight, breathing is a bit better, still a fair bit of sputum production.  A little shaky from lack of EtOH.  O: Today's Vitals   08/09/18 1958 08/10/18 0101 08/10/18 0426 08/10/18 0703  BP: (!) 152/83 133/79 (!) 163/95   Pulse: 99 82 83   Resp: 20 20 20    Temp: (!) 97.5 F (36.4 C) 98.7 F (37.1 C) (!) 97.5 F (36.4 C)   TempSrc: Oral Oral Oral   SpO2: 98% 94% 97%   Weight:    84.2 kg  Height:      PainSc:       Body mass index is 27.41 kg/m. GEN: middle aged man in NAD HEENT: MMM, poor dentition, no thrush CV: RRR, ext warm PULM: better air movement today, rhonci on R, no accessory muscle use GI: Soft, +BS EXT: No edema NEURO: Moves all 4 ext to command PSYCH: RASS 0, AOx3 SKIN: no rashes  Assessment Recurrent admissions for SOB and AECOPD symptoms in setting of progressive R lung infiltrates refractory to abx and steroids.  Differential is resistant/atypical PNA, COP, malignancy.  EtOH abuse.  Former smoker.  Plan - Bronch today 9:30AM, if difficult to sedate will hold off on Tbbx - Continue broad spectrum abx, breathing treatments, and steroids as ordered - Will need close OP imaging f/u to assure infiltrates are resolving  Erskine Emery MD PCCM

## 2018-08-10 NOTE — Progress Notes (Signed)
Video Bronchoscopy done Intervention Bronchial washing x2 Intervention Bronchial biopsy Intervention  Bronchial  brushing

## 2018-08-10 NOTE — Procedures (Addendum)
Bronchoscopy With BAL x 2, brushings, and Tbbx under fluoroscopic guidance  Indication: nonresolving infiltrate  Consent: Signed in chart  Anesthesia: lidocaine topical and nebulized, fentanyl and versed per RN records  Procedure - Timeout performed - Bronchoscope advanced through nose and through vocal cords into airways - Airways examined down to subsegmental level - Following airway examination, performed: (1) BAL RUL, cloudy return: cell count, culture, cyto (2) BAL RML, cloudy return: culture, cyto (3) Brushing RLL superior subsegment: cyto (4) Transbronchial biopsies RLL: 3 passes: path, minimal bleeding, no intervention needed  No immediate complications Fluoro time 27s  Findings - Leukoplakia vs. Thrush on vocal cords - Saber sheath trachea - Purulent secretions throughout R - Severe bronchiectasis bilaterally - Severe bronchial pitting bilaterally - No endobronchial lesions   Specimen(s):  RUL BAL RML BAL RLL brushing RLL Tbbx  Complications: none immediate, CXR ordered to r/o PTX, patient vitals stable throughout

## 2018-08-10 NOTE — Progress Notes (Signed)
SLP Cancellation Note  Patient Details Name: James [name] MRN: [id number] DOB: 1960/01/09   Cancelled treatment:        Patient in procedure (bronchoscopy). Will see at next available time.    Charlynne Cousins Kersten Salmons, MA, CCC-SLP 08/10/2018 10:05 AM

## 2018-08-10 NOTE — Progress Notes (Signed)
PROGRESS NOTE    James [name]  NIO:[id number] DOB: 1960-07-28 DOA: 08/09/2018 PCP: Scot Jun, FNP   Brief Narrative:  HPI on 08/09/2018 by Dr. Karmen Bongo James [name] is a 58 y.o. male with medical history significant of COPD and ETOH dependence presenting with respiratory distress.  He woke up about 530 this AM and couldn't breathe.  This just keeps happening.  He felt similarly when hospitalized last week.  He has been doing fine until today.  No h/o OSA, not on CPAP.  Not on home O2.  +cough, productive of yellowish sputum.  It is sometimes hard to get the sputum up.  No fever.  +wheezing since his last hospitalization.  Quit smoking in 2014.  [place]air[place]conditioned [place].  He was hospitalized from 7/27-28 for acute on chronic respiratory failure associated with COPD exacerbation and PNA.  This was his 4th admission this year.  He was seen by [name] on 7/30 with plan for completion of course of Keflex and prednisone, as well as prednisone taper.   Assessment & Plan   Acute on chronic hypoxic respiratory failure/sepsis due to multifocal pneumonia -Was recently hospitalized in late July for pneumonia and COPD exacerbation.  Was discharged with antibiotics as well as prednisone taper.  Patient followed up with [name] on 08/03/2018 with completion of Keflex and prednisone as well. -This is his fourth admission this year for similar presentation -Patient presented with cough and shortness of breath -Chest x-ray findings consistent with right-sided multifocal pneumonia -CTA chest showed multifocal pneumonia on the right.  COPD including emphysema.  Negative for PE. -COVID negative -Upon admission, patient had leukocytosis, fever, tachycardia, tachypnea -Noted to be hypoxic 88% on room air in the ED -Pulmonology consulted and appreciated planning on bronchoscopy today.  Continue broad-spectrum antibiotics, breathing treatments and steroids -Urine strep  pneumonia antigen negative -Sputum culture: Few GPC, GVR, rare yeast -Blood cultures pending -Continue vancomycin, Zosyn, Solu-Medrol, supplemental oxygen  COPD -Continue nebulizer treatments, steroids, inhalers -Incentive spirometry, flutter valve  Alcohol/marijuana use -Discussed cessation -Continue CIWA protocol  Essential hypertension -Continue Cozaar  DVT Prophylaxis  lovenox  Code Status: Full  Family Communication: None at bedside  Disposition Plan: Admitted. Pending bronchoscopy today and further pulmonology recommendations as well as improvement in respiratory status.  Consultants PCCM  Procedures  None  Antibiotics   Anti-infectives (From admission, onward)   Start     Dose/Rate Route Frequency Ordered Stop   08/09/18 1800  [MAR Hold]  vancomycin (VANCOCIN) IVPB 750 mg/150 ml premix     (MAR Hold since Thu 08/10/2018 at 0928.Hold Reason: Transfer to a Procedural area.)   750 mg 150 mL/hr over 60 Minutes Intravenous Every 8 hours 08/09/18 0847     08/09/18 1800  [MAR Hold]  piperacillin-tazobactam (ZOSYN) IVPB 3.375 g     (MAR Hold since Thu 08/10/2018 at 0928.Hold Reason: Transfer to a Procedural area.)   3.375 g 12.5 mL/hr over 240 Minutes Intravenous Every 8 hours 08/09/18 1113     08/09/18 1130  [MAR Hold]  piperacillin-tazobactam (ZOSYN) IVPB 3.375 g     (MAR Hold since Thu 08/10/2018 at 0928.Hold Reason: Transfer to a Procedural area.)   3.375 g 100 mL/hr over 30 Minutes Intravenous  Once 08/09/18 1111     08/09/18 0845  vancomycin (VANCOCIN) 1,500 mg in sodium chloride 0.9 % 500 mL IVPB     1,500 mg 250 mL/hr over 120 Minutes Intravenous  Once 08/09/18 0830 08/09/18  1443   08/09/18 0830  vancomycin (VANCOCIN) IVPB 1000 mg/200 mL premix  Status:  Discontinued     1,000 mg 200 mL/hr over 60 Minutes Intravenous  Once 08/09/18 0822 08/09/18 0830   08/09/18 0830  cefTRIAXone (ROCEPHIN) 2 g in sodium chloride 0.9 % 100 mL IVPB     2 g 200 mL/hr over 30 Minutes  Intravenous  Once 08/09/18 0822 08/09/18 1000   08/09/18 0830  azithromycin (ZITHROMAX) 500 mg in sodium chloride 0.9 % 250 mL IVPB     500 mg 250 mL/hr over 60 Minutes Intravenous  Once 08/09/18 7062 08/09/18 1229      Subjective:   James [name] seen and examined today.  Feels breathing has mildly improved but not back to his baseline.  Continues to need oxygen.  Continues to have productive cough.  Denies current chest pain, abdominal pain, nausea or vomiting, diarrhea or constipation, dizziness or headache.  Objective:   Vitals:   08/10/18 0802 08/10/18 0900 08/10/18 0920 08/10/18 0925  BP:  (!) 156/77 (!) 156/77 (!) 164/94  Pulse:   76 74  Resp:  20 19 17   Temp:      TempSrc:      SpO2: 98% 98% 98% 98%  Weight:      Height:        Intake/Output Summary (Last 24 hours) at 08/10/2018 0940 Last data filed at 08/10/2018 0704 Gross per 24 hour  Intake 2085.55 ml  Output 650 ml  Net 1435.55 ml   Filed Weights   08/09/18 1529 08/09/18 1536 08/10/18 0703  Weight: 83.3 kg 83.8 kg 84.2 kg    Exam  General: Well developed, well nourished, NAD, appears stated age  80: NCAT, mucous membranes moist.   Neck: Supple  Cardiovascular: S1 S2 auscultated, no murmurs, RRR  Respiratory: Diminished but clear, mild rhonchi on the right  Abdomen: Soft, nontender, nondistended, + bowel sounds  Extremities: warm dry without cyanosis clubbing or edema  Neuro: AAOx3, nonfocal  Psych: Appropriate mood and affect   Data Reviewed: I have personally reviewed following labs and imaging studies  CBC: Recent Labs  Lab 08/09/18 0944 08/10/18 0450  WBC 25.7* 19.9*  NEUTROABS 24.4* 18.6*  HGB 13.9 13.5  HCT 42.6 40.6  MCV 99.3 100.2*  PLT 230 376   Basic Metabolic Panel: Recent Labs  Lab 08/09/18 0617 08/10/18 0450  NA 143 137  K 3.7 4.2  CL 103 99  CO2 27 26  GLUCOSE 111* 184*  BUN 14 17  CREATININE 0.93 0.78  CALCIUM 9.4 8.7*   GFR: Estimated Creatinine Clearance:  101.9 mL/min (by C-G formula based on SCr of 0.78 mg/dL). Liver Function Tests: No results for input(s): AST, ALT, ALKPHOS, BILITOT, PROT, ALBUMIN in the last 168 hours. No results for input(s): LIPASE, AMYLASE in the last 168 hours. No results for input(s): AMMONIA in the last 168 hours. Coagulation Profile: Recent Labs  Lab 08/09/18 1320  INR 1.1   Cardiac Enzymes: No results for input(s): CKTOTAL, CKMB, CKMBINDEX, TROPONINI in the last 168 hours. BNP (last 3 results) No results for input(s): PROBNP in the last 8760 hours. HbA1C: No results for input(s): HGBA1C in the last 72 hours. CBG: No results for input(s): GLUCAP in the last 168 hours. Lipid Profile: No results for input(s): CHOL, HDL, LDLCALC, TRIG, CHOLHDL, LDLDIRECT in the last 72 hours. Thyroid Function Tests: No results for input(s): TSH, T4TOTAL, FREET4, T3FREE, THYROIDAB in the last 72 hours. Anemia Panel: No results for input(s):  VITAMINB12, FOLATE, FERRITIN, TIBC, IRON, RETICCTPCT in the last 72 hours. Urine analysis:    Component Value Date/Time   COLORURINE YELLOW 04/06/2016 1307   APPEARANCEUR CLEAR 04/06/2016 1307   LABSPEC 1.012 04/06/2016 1307   PHURINE 5.0 04/06/2016 1307   GLUCOSEU >=500 (A) 04/06/2016 1307   HGBUR NEGATIVE 04/06/2016 1307   BILIRUBINUR NEGATIVE 04/06/2016 1307   KETONESUR NEGATIVE 04/06/2016 1307   PROTEINUR NEGATIVE 04/06/2016 1307   UROBILINOGEN 4.0 (H) 06/15/2013 1030   NITRITE NEGATIVE 04/06/2016 1307   LEUKOCYTESUR NEGATIVE 04/06/2016 1307   Sepsis Labs: @LABRCNTIP (procalcitonin:4,lacticidven:4)  ) Recent Results (from the past 240 hour(s))  SARS Coronavirus 2 [facility] order, Performed in [facility] hospital lab) Nasopharyngeal Nasopharyngeal Swab     Status: None   Collection Time: 08/09/18  6:56 AM   Specimen: Nasopharyngeal Swab  Result Value Ref Range Status   SARS Coronavirus 2 NEGATIVE NEGATIVE Final    Comment: (NOTE) If result is NEGATIVE SARS-CoV-2 target  nucleic acids are NOT DETECTED. The SARS-CoV-2 RNA is generally detectable in upper and lower  respiratory specimens during the acute phase of infection. The lowest  concentration of SARS-CoV-2 viral copies this assay can detect is 250  copies / mL. A negative result does not preclude SARS-CoV-2 infection  and should not be used as the sole basis for treatment or other  patient management decisions.  A negative result may occur with  improper specimen collection / handling, submission of specimen other  than nasopharyngeal swab, presence of viral mutation(s) within the  areas targeted by this assay, and inadequate number of viral copies  (<250 copies / mL). A negative result must be combined with clinical  observations, patient history, and epidemiological information. If result is POSITIVE SARS-CoV-2 target nucleic acids are DETECTED. The SARS-CoV-2 RNA is generally detectable in upper and lower  respiratory specimens dur ing the acute phase of infection.  Positive  results are indicative of active infection with SARS-CoV-2.  Clinical  correlation with patient history and other diagnostic information is  necessary to determine patient infection status.  Positive results do  not rule out bacterial infection or co-infection with other viruses. If result is PRESUMPTIVE POSTIVE SARS-CoV-2 nucleic acids MAY BE PRESENT.   A presumptive positive result was obtained on the submitted specimen  and confirmed on repeat testing.  While 2019 novel coronavirus  (SARS-CoV-2) nucleic acids may be present in the submitted sample  additional confirmatory testing may be necessary for epidemiological  and / or clinical management purposes  to differentiate between  SARS-CoV-2 and other Sarbecovirus currently known to infect humans.  If clinically indicated additional testing with an alternate test  methodology [phone]) is advised. The SARS-CoV-2 RNA is generally  detectable in upper and lower  respiratory sp ecimens during the acute  phase of infection. The expected result is Negative. Fact Sheet for Patients:  StrictlyIdeas.no Fact Sheet for Healthcare Providers: BankingDealers.co.za This test is not yet approved or cleared by the [place] FDA and has been authorized for detection and/or diagnosis of SARS-CoV-2 by FDA under an Emergency Use Authorization (EUA).  This EUA will remain in effect (meaning this test can be used) for the duration of the COVID-19 declaration under Section 564(b)(1) of the Act, 21 U.S.C. section 360bbb-3(b)(1), unless the authorization is terminated or revoked sooner. Performed at [facility], [place] [address] [place], [place] [address]   Culture, sputum-assessment     Status: None   Collection Time: 08/09/18  3:00 PM  Specimen: Expectorated Sputum  Result Value Ref Range Status   Specimen Description EXPECTORATED SPUTUM  Final   Special Requests NONE  Final   Sputum evaluation   Final    THIS SPECIMEN IS ACCEPTABLE FOR SPUTUM CULTURE Performed at [facility], [address] [place], [place] [address]    Report Status 08/09/2018 FINAL  Final  Culture, respiratory     Status: None (Preliminary result)   Collection Time: 08/09/18  3:00 PM  Result Value Ref Range Status   Specimen Description EXPECTORATED SPUTUM  Final   Special Requests NONE Reflexed from [id number]  Final   Gram Stain   Final    ABUNDANT WBC PRESENT,BOTH PMN AND MONONUCLEAR FEW GRAM POSITIVE COCCI FEW GRAM VARIABLE ROD RARE YEAST    Culture   Final    CULTURE REINCUBATED FOR BETTER GROWTH Performed at [facility], [place] [address], [place], [place] [address]    Report Status PENDING  Incomplete      Radiology Studies: Ct Angio Chest Pe W And/or Wo Contrast  Result Date: 08/09/2018 CLINICAL DATA:  Shortness of breath EXAM: CT ANGIOGRAPHY CHEST WITH CONTRAST TECHNIQUE: Multidetector CT imaging of the  chest was performed using the standard protocol during bolus administration of intravenous contrast. Multiplanar CT image reconstructions and MIPs were obtained to evaluate the vascular anatomy. CONTRAST:  59mL OMNIPAQUE IOHEXOL 350 MG/ML SOLN COMPARISON:  03/26/2016 FINDINGS: Cardiovascular: Suboptimal bolus density but satisfactory opacification of the pulmonary arteries to the segmental level. No evidence of pulmonary embolism. Normal heart size. No pericardial effusion. Atherosclerotic calcification of the aorta. Mediastinum/Nodes: Negative for adenopathy or mass. Lungs/Pleura: Patchy consolidative and ground-glass opacity in the right lung. Background of hyperinflation with emphysema and airway thickening. No edema, effusion, or pneumothorax. Central airways are clear. Upper Abdomen: Negative Musculoskeletal: No acute finding Review of the MIP images confirms the above findings. IMPRESSION: 1. Multifocal pneumonia on the right. 2. COPD including emphysema. 3. Negative for pulmonary embolism. Electronically Signed   By: Monte Fantasia M.D.   On: 08/09/2018 07:57   Dg Chest Portable 1 View  Result Date: 08/09/2018 CLINICAL DATA:  Shortness of breath EXAM: PORTABLE CHEST 1 VIEW COMPARISON:  07/01/2018 FINDINGS: Ill-defined right base opacification. Background hyperinflation and apical lucency. Normal heart size and mediastinal contours. No effusion or pneumothorax. Remote bilateral rib fractures. IMPRESSION: 1. Asymmetric right base opacity suggesting pneumonia. 2. Emphysema. Electronically Signed   By: Monte Fantasia M.D.   On: 08/09/2018 06:55     Scheduled Meds: . butamben-tetracaine-benzocaine  1 spray Topical Once  . [MAR Hold] enoxaparin (LOVENOX) injection  40 mg Subcutaneous Q24H  . [MAR Hold] folic acid  1 mg Oral Daily  . [MAR Hold] guaiFENesin  600 mg Oral BID  . lidocaine  1 application Topical Once  . [MAR Hold] losartan  100 mg Oral Daily  . [MAR Hold] methylPREDNISolone (SOLU-MEDROL)  injection  60 mg Intravenous Q12H  . [MAR Hold] mometasone-formoterol  2 puff Inhalation BID  . [MAR Hold] multivitamin with minerals  1 tablet Oral Daily  . [MAR Hold] pantoprazole  40 mg Oral BID  . [MAR Hold] thiamine  100 mg Oral Daily   Or  . [MAR Hold] thiamine  100 mg Intravenous Daily  . [MAR Hold] umeclidinium bromide  1 puff Inhalation Daily   Continuous Infusions: . sodium chloride Stopped (08/10/18 0835)  . [MAR Hold] sodium chloride Stopped (08/10/18 0255)  . [MAR Hold] piperacillin-tazobactam    . [MAR Hold]  piperacillin-tazobactam (ZOSYN)  IV 12.5 mL/hr at 08/10/18 0500  . [MAR Hold] vancomycin Stopped (08/10/18 0451)     LOS: 1 day   Time Spent in minutes   30 minutes  Cliford Sequeira D.O. on 08/10/2018 at 9:40 AM  Between 7am to 7pm - Please see pager noted on [email or web address]  After 7pm go to [email or web address]  And look for the night coverage person covering for me after hours  [facility] Office  [phone]

## 2018-08-11 ENCOUNTER — Telehealth: Payer: Self-pay | Admitting: Internal Medicine

## 2018-08-11 ENCOUNTER — Encounter (HOSPITAL_COMMUNITY): Payer: Self-pay | Admitting: Internal Medicine

## 2018-08-11 ENCOUNTER — Inpatient Hospital Stay (HOSPITAL_COMMUNITY): Payer: Medicaid Other

## 2018-08-11 LAB — CULTURE, RESPIRATORY W GRAM STAIN: Culture: NORMAL

## 2018-08-11 LAB — CBC
HCT: 41.5 % (ref 39.0–52.0)
Hemoglobin: 13.4 g/dL (ref 13.0–17.0)
MCH: 32.8 pg (ref 26.0–34.0)
MCHC: 32.3 g/dL (ref 30.0–36.0)
MCV: 101.5 fL — ABNORMAL HIGH (ref 80.0–100.0)
Platelets: 204 10*3/uL (ref 150–400)
RBC: 4.09 MIL/uL — ABNORMAL LOW (ref 4.22–5.81)
RDW: 13.2 % (ref 11.5–15.5)
WBC: 15.9 10*3/uL — ABNORMAL HIGH (ref 4.0–10.5)
nRBC: 0 % (ref 0.0–0.2)

## 2018-08-11 LAB — BASIC METABOLIC PANEL
Anion gap: 9 (ref 5–15)
BUN: 16 mg/dL (ref 6–20)
CO2: 27 mmol/L (ref 22–32)
Calcium: 8.7 mg/dL — ABNORMAL LOW (ref 8.9–10.3)
Chloride: 102 mmol/L (ref 98–111)
Creatinine, Ser: 0.86 mg/dL (ref 0.61–1.24)
GFR calc Af Amer: >60 mL/min (ref >60)
GFR calc non Af Amer: >60 mL/min (ref >60)
Glucose, Bld: 195 mg/dL — ABNORMAL HIGH (ref 70–99)
Potassium: 4.2 mmol/L (ref 3.5–5.1)
Sodium: 138 mmol/L (ref 135–145)

## 2018-08-11 LAB — LACTIC ACID, PLASMA: Lactic Acid, Venous: 2.3 mmol/L (ref 0.5–1.9)

## 2018-08-11 LAB — PROCALCITONIN: Procalcitonin: 0.13 ng/mL

## 2018-08-11 MED ORDER — AMOXICILLIN-POT CLAVULANATE 875-125 MG PO TABS: 1.0000 | ORAL_TABLET | Freq: Two times a day (BID) | ORAL | 0 refills | Status: AC

## 2018-08-11 MED ORDER — PREDNISONE 20 MG PO TABS: 40.0000 mg | ORAL_TABLET | Freq: Every day | ORAL | Status: DC

## 2018-08-11 MED ORDER — PREDNISONE 20 MG PO TABS: 40.0000 mg | ORAL_TABLET | Freq: Every day | ORAL | 0 refills | Status: AC

## 2018-08-11 MED ORDER — GUAIFENESIN ER 600 MG PO TB12: 600.0000 mg | ORAL_TABLET | Freq: Two times a day (BID) | ORAL | Status: DC

## 2018-08-11 MED ORDER — ADULT MULTIVITAMIN W/MINERALS CH: 1.0000 | ORAL_TABLET | Freq: Every day | ORAL | Status: DC

## 2018-08-11 MED FILL — AMOX-CLAV 875-125 MG TABLET: 875-125 | 7 days supply | Qty: 14 | Fill #0

## 2018-08-11 NOTE — Progress Notes (Signed)
Modified Barium Swallow Progress Note  Patient Details  Name: KENNON ENCINAS MRN: [id number] Date of Birth: 11/02/1960  Today's Date: 08/11/2018  Modified Barium Swallow completed.  Full report located under Chart Review in the Imaging Section.  Brief recommendations include the following:  Clinical Impression  Pt demonstrates normal oral and oropharyngeal swallow. No penetration or aspiration occurred. Esophageal sweep appeared WNL. Noted to have thick tissue below epiglottis on anterior portion of vestibule on supraglottic tissue. Discussed with radiologist and MD. 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 Will sign off.    Swallow Evaluation Recommendations           Liquid Administration via: Cup;Straw   Medication Administration: Whole meds with liquid   Supervision: Patient able to self feed                  Herbie Baltimore, MA CCC-SLP  Acute Rehabilitation Services Pager [phone] Office [phone]   Gurjit Loconte, Katherene Ponto 08/11/2018,2:20 PM

## 2018-08-11 NOTE — Progress Notes (Signed)
SATURATION QUALIFICATIONS: (This note is used to comply with regulatory documentation for home oxygen)  Patient Saturations on Room Air at Rest = 96%  Patient Saturations on Room Air while Ambulating = 95%  Patient Saturations on 0 Liters of oxygen while Ambulating = 95%  Please briefly explain why patient needs home oxygen: Pt does not need oxygen, O2 SAT stay above 90 while ambulating.

## 2018-08-11 NOTE — TOC Transition Note (Signed)
Transition of Care [facility]) - CM/SW Discharge Note   Patient Details  Name: James [name] MRN: [id number] Date of Birth: [birth year]  [facility] CM/SW Contact:  Marilu Favre, RN Phone Number: 08/11/2018, 2:47 PM   Clinical Narrative:     Patient from home alone. Already active with [facility] and can pick up prescriptions there at discharge.  Final next level of care: Home/Self Care Barriers to Discharge: No Barriers Identified   Patient Goals and CMS Choice Patient states their goals for this hospitalization and ongoing recovery are:: to go home CMS Medicare.gov Compare Post Acute Care list provided to:: Patient Choice offered to / list presented to : NA  Discharge Placement                       Discharge Plan and Services   Discharge Planning Services: CM Consult, [facility], [place], Medication Assistance            DME Arranged: N/A         HH Arranged: NA          Social Determinants of Health (SDOH) Interventions     Readmission Risk Interventions No flowsheet data found.

## 2018-08-11 NOTE — Progress Notes (Addendum)
08/10/18 Pulmonary Note CC: SOB S: Doing better today.  Less dyspneic on exertion.  Occasional yellow/black sputum.  Minimal hemoptysis.  O: Today's Vitals   08/11/18 0228 08/11/18 0542 08/11/18 0706 08/11/18 0741  BP:  (!) 145/81    Pulse:  65    Resp:  20    Temp:  98.4 F (36.9 C)    TempSrc:  Oral    SpO2:  96%  98%  Weight: 85.6 kg  85.8 kg   Height:      PainSc:       Body mass index is 27.93 kg/m. GEN: middle aged man in NAD HEENT: MMM, poor dentition, no thrush CV: RRR, ext warm PULM: occasional wheezing, no accessory muscle use GI: Soft, +BS EXT: No edema NEURO: Moves all 4 ext to command PSYCH: RASS 0, AOx3 SKIN: no rashes  Sputum normal flora BAL pending  Assessment # Recurrent admissions for SOB and AECOPD symptoms in setting of progressive R lung infiltrates refractory to abx and steroids.  Differential is resistant/atypical PNA, COP, malignancy.  This cleared pretty rapidly with low procalcitonin arguing for an inflammatory process more than infectious.  Bronch mostly notable for severe chronic bronchitic and bronchiectic changes  # r/o chronic aspiration, MBSS pending  # EtOH abuse.  # Former smoker.  Smoking marijuana currently.  Wonder if this is contributing.  Plan - Switch to augmentin 875/125 for 5 more days, will be 7 days  - Give prednisone 55m x 1 week then would taper as OP - Fine to go home after MBSS and home O2 eval from my standpoint - Marijuana cessation counseling - Will arrange for OP f/u in 1 week, please call if any questions or concerns  DErskine EmeryMD PCCM

## 2018-08-11 NOTE — Discharge Summary (Signed)
Physician Discharge Summary  James [name] IDP:[id number] DOB: [birth year] DOA: 08/09/2018  PCP: James Jun, FNP  Admit date: 08/09/2018 Discharge date: 08/11/2018  Time spent: 45 minutes  Recommendations for Outpatient Follow-up:  Patient will be discharged to home.  Patient will need to follow up with primary care provider within one week of discharge.  Follow up with pulmonology. Patient should continue medications as prescribed.  Patient should follow a heart healthy diet.   Discharge Diagnoses:  Acute on chronic hypoxic respiratory failure/sepsis due to multifocal pneumonia COPD Alcohol/marijuana use Essential hypertension  Discharge Condition: Stable  Diet recommendation: heart healthy  Filed Weights   08/10/18 0703 08/11/18 0228 08/11/18 0706  Weight: 84.2 kg 85.6 kg 85.8 kg    History of present illness:  on 08/09/2018 by Dr. [name] a 58 y.o.malewith medical history significant ofCOPD and ETOH dependence presenting with respiratory distress.He woke up about 530 this AM and couldn't breathe. This just keeps happening. He felt similarly when hospitalized last week. He has been doing fine until today. No h/o OSA, not on CPAP. Not on home O2. +cough, productive of yellowish sputum. It is sometimes hard to get the sputum up. No fever. +wheezing since his last hospitalization. Quit smoking in 2014. [place] a non-air-conditioned [place] cabin.  He was hospitalized from 7/27-28 for acute on chronic respiratory failure associated with COPD exacerbation and PNA. This was his 4th admission this year. He was seen by Dr. Joya [name] on 7/30 with plan for completion of course of Keflex and prednisone, as well as prednisone taper.  Hospital Course:  Acute on chronic hypoxic respiratory failure/sepsis due to multifocal pneumonia -Was recently hospitalized in late July for pneumonia and COPD exacerbation.  Was discharged with antibiotics as well as  prednisone taper.  Patient followed up with Dr. Joya [name] on 08/03/2018 with completion of Keflex and prednisone as well. -This is his fourth admission this year for similar presentation -Patient presented with cough and shortness of breath -Chest x-ray on admission findings consistent with right-sided multifocal pneumonia -CTA chest showed multifocal pneumonia on the right.  COPD including emphysema.  Negative for PE. -COVID negative -Upon admission, patient had leukocytosis, fever, tachycardia, tachypnea- all resolving  -Noted to be hypoxic 88% on room air in the ED -Urine strep pneumonia and legionella antigens negative -Sputum culture: Few GPC, GVR, rare yeast -Blood cultures show no growth -was placed on vancomycin, Zosyn, Solu-Medrol, supplemental oxygen -S/p bronchoscopy 8/6, pending culture results -CXR obtained after bronch on 8/6: almost complete clearing of the hazy infiltrates in the right lung -Discussed with Dr. Tamala [name] (pulm), recommended prednisone taper starting with prednisone 59m daily x 1 week, taper as an outpatient, along with Augmentin. He will follow up with the patient in one week. Given that infection cleared on xray so quickly, question if this is inflammatory vs infection.  -patient able to ambulate today without oxygen and maintained oxygen saturations in the mid 90s- therefore patient does not qualify for home oxygen -patient also had a swallow eval- mild aspiration risk  COPD -Continue nebulizer treatments, steroids, inhalers -Incentive spirometry, flutter valve  Alcohol/marijuana use -Discussed cessation -was placed on continue CIWA protocol  Essential hypertension -Continue Cozaar  Consultants PCCM- pulmonology   Procedures  Bronchoscopy  Discharge Exam: Vitals:   08/11/18 0542 08/11/18 0741  BP: (!) 145/81   Pulse: 65   Resp: 20   Temp: 98.4 F (36.9 C)   SpO2: 96% 98%     General:  Well developed, well nourished, NAD, appears stated  age  11: NCAT, mucous membranes moist.  Cardiovascular: S1 S2 auscultated, RRR, no murmur  Respiratory: Diminished breath sounds  Abdomen: Soft, nontender, nondistended, + bowel sounds  Extremities: warm dry without cyanosis clubbing or edema  Neuro: AAOx3, nonfocal  Psych: Flat  Discharge Instructions Discharge Instructions    Discharge instructions   Complete by: As directed    Patient will be discharged to home.  Patient will need to follow up with primary care provider within one week of discharge.  Follow up with pulmonology. Patient should continue medications as prescribed.  Patient should follow a heart healthy diet.     Allergies as of 08/11/2018   No Known Allergies     Medication List    TAKE these medications   AeroChamber MV inhaler Use as instructed   albuterol 108 (90 Base) MCG/ACT inhaler Commonly known as: VENTOLIN HFA Inhale 1-2 puffs into the lungs every 6 (six) hours as needed for wheezing or shortness of breath.   albuterol (2.5 MG/3ML) 0.083% nebulizer solution Commonly known as: PROVENTIL Take 3 mLs (2.5 mg total) by nebulization every 6 (six) hours as needed for wheezing or shortness of breath.   amoxicillin-clavulanate 875-125 MG tablet Commonly known as: Augmentin Take 1 tablet by mouth 2 (two) times daily for 7 days.   Flutter Devi Use 4 times daily   folic acid 1 MG tablet Commonly known as: FOLVITE Take 1 tablet (1 mg total) by mouth daily.   guaiFENesin 600 MG 12 hr tablet Commonly known as: MUCINEX Take 1 tablet (600 mg total) by mouth 2 (two) times daily.   Incruse Ellipta 62.5 MCG/INH Aepb Generic drug: umeclidinium bromide Inhale 1 puff into the lungs daily.   losartan 100 MG tablet Commonly known as: Cozaar Take 1 tablet (100 mg total) by mouth daily.   mometasone-formoterol 200-5 MCG/ACT Aero Commonly known as: DULERA Inhale 2 puffs into the lungs 2 (two) times daily.   multivitamin with minerals Tabs  tablet Take 1 tablet by mouth daily. Start taking on: August 12, 2018   pantoprazole 40 MG tablet Commonly known as: PROTONIX Take 40 mg by mouth daily.   predniSONE 20 MG tablet Commonly known as: DELTASONE Take 2 tablets (40 mg total) by mouth daily with breakfast for 14 days. Start taking on: August 12, 2018 What changed:   how much to take  how to take this  when to take this  additional instructions   thiamine 100 MG tablet Commonly known as: VITAMIN B-1 Take 1 tablet (100 mg total) by mouth daily.            Durable Medical Equipment  (From admission, onward)         Start     Ordered   08/11/18 0954  For home use only DME oxygen  Once    Question Answer Comment  Length of Need 12 Months   Mode or (Route) Nasal cannula   Oxygen delivery system Gas      08/11/18 0953         No Known Allergies Follow-up Information    James Jun, FNP. Schedule an appointment as soon as possible for a visit in 10 days.   Specialty: Family Medicine Why: For Hospital Follow-up Contact information: [place] [address] [phone]        Candee Furbish, MD. Schedule an appointment as soon as possible for a visit in 1 week.  Specialty: Pulmonary Disease Why: Hospital follow up Contact information: [place] [address] [phone]            The results of significant diagnostics from this hospitalization (including imaging, microbiology, ancillary and laboratory) are listed below for reference.    Significant Diagnostic Studies: Dg Chest 1 View  Result Date: 08/10/2018 CLINICAL DATA:  Status post right video bronchoscopy with biopsies. EXAM: CHEST  1 VIEW COMPARISON:  08/09/2018 FINDINGS: There is no evidence of pneumothorax after bronchoscopy. Heart size and pulmonary vascularity are normal. The hazy infiltrates in the right lung have almost resolved. Left lung is clear. Chronic blunting of the  costophrenic angles on the left. No acute bone abnormality. Old left clavicle fracture. Old bilateral rib fractures. IMPRESSION: 1. No pneumothorax after bronchoscopy. 2. Almost complete clearing of the hazy infiltrates in the right lung. Electronically Signed   By: Lorriane Shire M.D.   On: 08/10/2018 12:57   Dg Chest 2 View  Result Date: 07/18/2018 CLINICAL DATA:  Recurring pneumonia EXAM: CHEST - 2 VIEW COMPARISON:  June 20, 2018 FINDINGS: The heart size and mediastinal contours are within normal limits. Coarsened increased pulmonary markings are identified in both lungs. Mild hazy opacity of the right lung base is unchanged. There is interval developed patchy opacity of the medial left lung base. The visualized skeletal structures are stable. IMPRESSION: Stable mild hazy opacity of right lung base and interval developed patchy opacity of medial left lung base suspicious for pneumonia. Electronically Signed   By: Abelardo Diesel M.D.   On: 07/18/2018 10:58   Ct Angio Chest Pe W And/or Wo Contrast  Result Date: 08/09/2018 CLINICAL DATA:  Shortness of breath EXAM: CT ANGIOGRAPHY CHEST WITH CONTRAST TECHNIQUE: Multidetector CT imaging of the chest was performed using the standard protocol during bolus administration of intravenous contrast. Multiplanar CT image reconstructions and MIPs were obtained to evaluate the vascular anatomy. CONTRAST:  46m OMNIPAQUE IOHEXOL 350 MG/ML SOLN COMPARISON:  03/26/2016 FINDINGS: Cardiovascular: Suboptimal bolus density but satisfactory opacification of the pulmonary arteries to the segmental level. No evidence of pulmonary embolism. Normal heart size. No pericardial effusion. Atherosclerotic calcification of the aorta. Mediastinum/Nodes: Negative for adenopathy or mass. Lungs/Pleura: Patchy consolidative and ground-glass opacity in the right lung. Background of hyperinflation with emphysema and airway thickening. No edema, effusion, or pneumothorax. Central airways are clear.  Upper Abdomen: Negative Musculoskeletal: No acute finding Review of the MIP images confirms the above findings. IMPRESSION: 1. Multifocal pneumonia on the right. 2. COPD including emphysema. 3. Negative for pulmonary embolism. Electronically Signed   By: JMonte FantasiaM.D.   On: 08/09/2018 07:57   Dg Chest Portable 1 View  Result Date: 08/09/2018 CLINICAL DATA:  Shortness of breath EXAM: PORTABLE CHEST 1 VIEW COMPARISON:  07/01/2018 FINDINGS: Ill-defined right base opacification. Background hyperinflation and apical lucency. Normal heart size and mediastinal contours. No effusion or pneumothorax. Remote bilateral rib fractures. IMPRESSION: 1. Asymmetric right base opacity suggesting pneumonia. 2. Emphysema. Electronically Signed   By: JMonte FantasiaM.D.   On: 08/09/2018 06:55   Dg Chest Port 1 View  Result Date: 07/31/2018 CLINICAL DATA:  Shortness of breath EXAM: PORTABLE CHEST 1 VIEW COMPARISON:  07/18/2018 FINDINGS: Patchy bilateral lower lobe opacities, suspicious for pneumonia. No pleural effusion or pneumothorax. The heart is normal in size. IMPRESSION: Patchy bilateral lower lobe opacities, suspicious for pneumonia. Electronically Signed   By: SJulian HyM.D.   On: 07/31/2018 03:13   Dg Swallowing Func-speech Pathology  Result Date: 08/11/2018 Objective Swallowing Evaluation: Type of Study: MBS-Modified Barium Swallow Study  Patient Details Name: James [name] MRN: [id number] Date of Birth: 1960-02-22 Today's Date: 08/11/2018 Time: SLP Start Time (ACUTE ONLY): 9 -SLP Stop Time (ACUTE ONLY): 1120 SLP Time Calculation (min) (ACUTE ONLY): 15 min Past Medical History: Past Medical History: Diagnosis Date  Acute on chronic respiratory failure with hypoxia ([place]) 06/08/2013  COPD (chronic obstructive pulmonary disease) ([place])   not on home  Pneumonia 04/06/2016  Pneumothorax   2016, fell from ladder  Tick bite 06/03/2018 Past Surgical History: Past Surgical History: Procedure Laterality Date   VIDEO ASSISTED THORACOSCOPY (VATS)/THOROCOTOMY Left 06/11/2013  Procedure: VIDEO ASSISTED THORACOSCOPY (VATS)/THOROCOTOMY;  Surgeon: Ivin Poot, MD;  Location: [facility];  Service: Thoracic;  Laterality: Left;  VIDEO BRONCHOSCOPY N/A 06/11/2013  Procedure: VIDEO BRONCHOSCOPY;  Surgeon: Ivin Poot, MD;  Location: [facility];  Service: Thoracic;  Laterality: N/A;  VIDEO BRONCHOSCOPY N/A 08/10/2018  Procedure: VIDEO BRONCHOSCOPY WITH FLUORO;  Surgeon: Candee Furbish, MD;  [facility];  Service: Endoscopy;  Laterality: N/A; HPI: Mr Bram Hottel, 58y/m presented to ED due to SOB, productive cough. PMH significant for COPD, GERD and ETOH, stopped smoking in 2014. This is his 4th admission this year. No history of known swallowing problems.  Subjective: alert and cooperative Assessment / Plan / Recommendation CHL IP CLINICAL IMPRESSIONS 08/11/2018 Clinical Impression Pt demonstrates normal oral and oropharyngeal swallow. no penetration or aspiration occurred. Esophageal sweep appeared WNL. Noted to have thick tissue below epiglottis on anterior portion of vestibule on supraglottic tissue. Discussed with radiologist and MD. 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 Will sign off.  SLP Visit Diagnosis Dysphagia, unspecified (R13.10) Attention and concentration deficit following -- Frontal lobe and executive function deficit following -- Impact on safety and function Mild aspiration risk   CHL IP TREATMENT RECOMMENDATION 08/11/2018 Treatment Recommendations No treatment recommended at this time   No flowsheet data found. CHL IP DIET RECOMMENDATION 08/11/2018 SLP Diet Recommendations -- Liquid Administration via Cup;Straw Medication Administration Whole meds with liquid Compensations -- Postural Changes --   No flowsheet data found.  No flowsheet data found.  No flowsheet data found.     CHL IP ORAL PHASE 08/11/2018 Oral Phase WFL Oral - Pudding Teaspoon  -- Oral - Pudding Cup -- Oral - Honey Teaspoon -- Oral - Honey Cup -- Oral - Nectar Teaspoon -- Oral - Nectar Cup -- Oral - Nectar Straw -- Oral - Thin Teaspoon -- Oral - Thin Cup -- Oral - Thin Straw -- Oral - Puree -- Oral - Mech Soft -- Oral - Regular -- Oral - Multi-Consistency -- Oral - Pill -- Oral Phase - Comment --  CHL IP PHARYNGEAL PHASE 08/11/2018 Pharyngeal Phase WFL Pharyngeal- Pudding Teaspoon -- Pharyngeal -- Pharyngeal- Pudding Cup -- Pharyngeal -- Pharyngeal- Honey Teaspoon -- Pharyngeal -- Pharyngeal- Honey Cup -- Pharyngeal -- Pharyngeal- Nectar Teaspoon -- Pharyngeal -- Pharyngeal- Nectar Cup -- Pharyngeal -- Pharyngeal- Nectar Straw -- Pharyngeal -- Pharyngeal- Thin Teaspoon -- Pharyngeal -- Pharyngeal- Thin Cup -- Pharyngeal -- Pharyngeal- Thin Straw -- Pharyngeal -- Pharyngeal- Puree -- Pharyngeal -- Pharyngeal- Mechanical Soft -- Pharyngeal -- Pharyngeal- Regular -- Pharyngeal -- Pharyngeal- Multi-consistency -- Pharyngeal -- Pharyngeal- Pill -- Pharyngeal -- Pharyngeal Comment --  CHL IP CERVICAL ESOPHAGEAL PHASE 08/11/2018 Cervical Esophageal Phase WFL Pudding Teaspoon -- Pudding Cup -- Honey Teaspoon -- Honey Cup -- Nectar Teaspoon --  Nectar Cup -- Nectar Straw -- Thin Teaspoon -- Thin Cup -- Thin Straw -- Puree -- Mechanical Soft -- Regular -- Multi-consistency -- Pill -- Cervical Esophageal Comment -- DeBlois, Katherene Ponto 08/11/2018, 2:22 PM              Dg C-arm Bronchoscopy  Result Date: 08/10/2018 C-ARM BRONCHOSCOPY: Fluoroscopy was utilized by the requesting physician.  No radiographic interpretation.    Microbiology: Recent Results (from the past 240 hour(s))  SARS Coronavirus 2 [facility] order, Performed in [facility] hospital lab) Nasopharyngeal Nasopharyngeal Swab     Status: None   Collection Time: 08/09/18  6:56 AM   Specimen: Nasopharyngeal Swab  Result Value Ref Range Status   SARS Coronavirus 2 NEGATIVE NEGATIVE Final    Comment: (NOTE) If result is  NEGATIVE SARS-CoV-2 target nucleic acids are NOT DETECTED. The SARS-CoV-2 RNA is generally detectable in upper and lower  respiratory specimens during the acute phase of infection. The lowest  concentration of SARS-CoV-2 viral copies this assay can detect is 250  copies / mL. A negative result does not preclude SARS-CoV-2 infection  and should not be used as the sole basis for treatment or other  patient management decisions.  A negative result may occur with  improper specimen collection / handling, submission of specimen other  than nasopharyngeal swab, presence of viral mutation(s) within the  areas targeted by this assay, and inadequate number of viral copies  (<250 copies / mL). A negative result must be combined with clinical  observations, patient history, and epidemiological information. If result is POSITIVE SARS-CoV-2 target nucleic acids are DETECTED. The SARS-CoV-2 RNA is generally detectable in upper and lower  respiratory specimens dur ing the acute phase of infection.  Positive  results are indicative of active infection with SARS-CoV-2.  Clinical  correlation with patient history and other diagnostic information is  necessary to determine patient infection status.  Positive results do  not rule out bacterial infection or co-infection with other viruses. If result is PRESUMPTIVE POSTIVE SARS-CoV-2 nucleic acids MAY BE PRESENT.   A presumptive positive result was obtained on the submitted specimen  and confirmed on repeat testing.  While 2019 novel coronavirus  (SARS-CoV-2) nucleic acids may be present in the submitted sample  additional confirmatory testing may be necessary for epidemiological  and / or clinical management purposes  to differentiate between  SARS-CoV-2 and other Sarbecovirus currently known to infect humans.  If clinically indicated additional testing with an alternate test  methodology [phone]) is advised. The SARS-CoV-2 RNA is generally  detectable  in upper and lower respiratory sp ecimens during the acute  phase of infection. The expected result is Negative. Fact Sheet for Patients:  StrictlyIdeas.no Fact Sheet for Healthcare Providers: BankingDealers.co.za This test is not yet approved or cleared by the [place] FDA and has been authorized for detection and/or diagnosis of SARS-CoV-2 by FDA under an Emergency Use Authorization (EUA).  This EUA will remain in effect (meaning this test can be used) for the duration of the COVID-19 declaration under Section 564(b)(1) of the Act, 21 U.S.C. section 360bbb-3(b)(1), unless the authorization is terminated or revoked sooner. Performed at [facility], [place] [address] [place], [place] [address]   Blood culture (routine x 2)     Status: None (Preliminary result)   Collection Time: 08/09/18  7:55 AM   Specimen: BLOOD LEFT HAND  Result Value Ref Range Status   Specimen Description BLOOD LEFT HAND  Final   Special  Requests   Final    BOTTLES DRAWN AEROBIC AND ANAEROBIC Blood Culture results may not be optimal due to an inadequate volume of blood received in culture bottles   Culture   Final    NO GROWTH 2 DAYS Performed at [facility], [place] [address] [place], [place] [address]    Report Status PENDING  Incomplete  Blood culture (routine x 2)     Status: None (Preliminary result)   Collection Time: 08/09/18  7:55 AM   Specimen: BLOOD  Result Value Ref Range Status   Specimen Description BLOOD SITE NOT SPECIFIED  Final   Special Requests   Final    BOTTLES DRAWN AEROBIC AND ANAEROBIC Blood Culture results may not be optimal due to an inadequate volume of blood received in culture bottles   Culture   Final    NO GROWTH 2 DAYS Performed at [facility], [place] [address], [place], [place] [address]    Report Status PENDING  Incomplete  Culture, sputum-assessment     Status: None   Collection Time: 08/09/18  3:00 PM    Specimen: Expectorated Sputum  Result Value Ref Range Status   Specimen Description EXPECTORATED SPUTUM  Final   Special Requests NONE  Final   Sputum evaluation   Final    THIS SPECIMEN IS ACCEPTABLE FOR SPUTUM CULTURE Performed at [facility], [place] [address] [place], [place] [address]    Report Status 08/09/2018 FINAL  Final  Culture, respiratory     Status: None   Collection Time: 08/09/18  3:00 PM  Result Value Ref Range Status   Specimen Description EXPECTORATED SPUTUM  Final   Special Requests NONE Reflexed from [id number]  Final   Gram Stain   Final    ABUNDANT WBC PRESENT,BOTH PMN AND MONONUCLEAR FEW GRAM POSITIVE COCCI FEW GRAM VARIABLE ROD RARE YEAST    Culture   Final    Consistent with normal respiratory flora. Performed at [facility], [place] [address] [place], [place] [address]    Report Status 08/11/2018 FINAL  Final  Culture, respiratory (non-expectorated)     Status: None (Preliminary result)   Collection Time: 08/10/18 10:22 AM   Specimen: Bronchoalveolar Lavage; Respiratory  Result Value Ref Range Status   Specimen Description BRONCHIAL ALVEOLAR LAVAGE RUL  Final   Special Requests Immunocompromised  Final   Gram Stain   Final    FEW WBC PRESENT, PREDOMINANTLY PMN RARE GRAM POSITIVE COCCI IN CLUSTERS RARE GRAM NEGATIVE COCCOBACILLI Performed at [facility], [address], [place], [place] [address]    Culture PENDING  Incomplete   Report Status PENDING  Incomplete  Culture, respiratory (non-expectorated)     Status: None (Preliminary result)   Collection Time: 08/10/18 10:22 AM   Specimen: Bronchoalveolar Lavage; Respiratory  Result Value Ref Range Status   Specimen Description BRONCHIAL ALVEOLAR LAVAGE RML  Final   Special Requests Immunocompromised  Final   Gram Stain   Final    FEW WBC PRESENT,BOTH PMN AND MONONUCLEAR NO ORGANISMS SEEN    Culture   Final    CULTURE REINCUBATED FOR BETTER GROWTH Performed at [place], [address] [place], [place] [address]    Report Status PENDING  Incomplete     Labs: Basic Metabolic Panel: Recent Labs  Lab 08/09/18 0617 08/10/18 0450 08/11/18 0458  NA 143 137 138  K 3.7 4.2 4.2  CL 103 99 102  CO2 27  26 27  GLUCOSE 111* 184* 195*  BUN _0 CREATININE 0.93 0.78 0.86  CALCIUM 9.4 8.7* 8.7*   Liver Function Tests: No results for input(s): AST, ALT, ALKPHOS, BILITOT, PROT, ALBUMIN in the last 168 hours. No results for input(s): LIPASE, AMYLASE in the last 168 hours. No results for input(s): AMMONIA in the last 168 hours. CBC: Recent Labs  Lab 08/09/18 0944 08/10/18 0450 08/11/18 0458  WBC 25.7* 19.9* 15.9*  NEUTROABS 24.4* 18.6*  --   HGB 13.9 13.5 13.4  HCT 42.6 40.6 41.5  MCV 99.3 100.2* 101.5*  PLT 230 215 204   Cardiac Enzymes: No results for input(s): CKTOTAL, CKMB, CKMBINDEX, TROPONINI in the last 168 hours. BNP: BNP (last 3 results) Recent Labs    05/17/18 0902 06/02/18 0936 07/31/18 0240  BNP 31.4 38.8 71.0    ProBNP (last 3 results) No results for input(s): PROBNP in the last 8760 hours.  CBG: No results for input(s): GLUCAP in the last 168 hours.     Signed:  Cristal Ford  [facility] 08/11/2018, 2:30 PM

## 2018-08-11 NOTE — Evaluation (Signed)
Clinical/Bedside Swallow Evaluation Patient Details  Name: James [name] MRN: [id number] Date of Birth: 1960/10/25  Today's Date: 08/11/2018 Time: SLP Start Time (ACUTE ONLY): 0845 SLP Stop Time (ACUTE ONLY): 0915 SLP Time Calculation (min) (ACUTE ONLY): 30 min  Past Medical History:  Past Medical History:  Diagnosis Date  . Acute on chronic respiratory failure with hypoxia ([facility]) 06/08/2013  . COPD (chronic obstructive pulmonary disease) ([place])    not on home  . Pneumonia 04/06/2016  . Pneumothorax    2016, fell from ladder  . Tick bite 06/03/2018   Past Surgical History:  Past Surgical History:  Procedure Laterality Date  . VIDEO ASSISTED THORACOSCOPY (VATS)/THOROCOTOMY Left 06/11/2013   Procedure: VIDEO ASSISTED THORACOSCOPY (VATS)/THOROCOTOMY;  Surgeon: Ivin Poot, MD;  Location: [facility];  Service: Thoracic;  Laterality: Left;  [name] VIDEO BRONCHOSCOPY N/A 06/11/2013   Procedure: VIDEO BRONCHOSCOPY;  Surgeon: Ivin Poot, MD;  Location: [facility];  Service: Thoracic;  Laterality: N/A;  . VIDEO BRONCHOSCOPY N/A 08/10/2018   Procedure: VIDEO BRONCHOSCOPY WITH FLUORO;  Surgeon: Candee Furbish, MD;  Location: [facility] ENDOSCOPY;  Service: Endoscopy;  Laterality: N/A;   HPI:  Mr James [name], 58y/m presented to ED due to SOB, productive cough. PMH significant for COPD, GERD and ETOH, stopped smoking in 2014. This is his 4th admission this year. No history of known swallowing problems.   Assessment / Plan / Recommendation Clinical Impression  [name] has been hospitalized 4 times this year with COPD exacerbation/PNA. His oral/sensory evaluation was WNL. PO trials included ice chips, water, puree and soft solids from breakfast tray. Throat clearing noted with thin liquids only and multiple swallows with solids. MBS recommended to further assess swallowing function.  SLP Visit Diagnosis: Dysphagia, unspecified (R13.10)    Aspiration Risk  Moderate aspiration risk              Other   Recommendations Oral Care Recommendations: Oral care BID   Follow up Recommendations   MBS       Swallow Study   General Date of Onset: 08/09/18 HPI: Mr James [name], 58y/m presented to ED due to SOB, productive cough. PMH significant for COPD, GERD and ETOH, stopped smoking in 2014. This is his 4th admission this year. No history of known swallowing problems. Type of Study: Bedside Swallow Evaluation Previous Swallow Assessment: none Diet Prior to this Study: Regular;Thin liquids Temperature Spikes Noted: No Respiratory Status: Nasal cannula History of Recent Intubation: No Behavior/Cognition: Alert;Cooperative;Pleasant mood Oral Cavity Assessment: Within Functional Limits Oral Care Completed by SLP: No Oral Cavity - Dentition: Adequate natural dentition Vision: Functional for self-feeding Self-Feeding Abilities: Able to feed self Patient Positioning: Upright in bed Baseline Vocal Quality: Hoarse Volitional Cough: Strong;Wet Volitional Swallow: Able to elicit    Oral/Motor/Sensory Function     Ice Chips Ice chips: Within functional limits Presentation: Spoon   Thin Liquid Thin Liquid: Impaired Presentation: Cup Pharyngeal  Phase Impairments: Throat Clearing - Immediate;Multiple swallows    Nectar Thick Nectar Thick Liquid: Not tested   Honey Thick Honey Thick Liquid: Not tested   Puree Puree: Within functional limits   Solid     Solid: Within functional limits      [place], MA, CCC-SLP 08/11/2018 11:39 AM

## 2018-08-11 NOTE — Telephone Encounter (Addendum)
James Furbish, MD  P Lb[facility] Triage Pool   Cc: [name], MD        Can we schedule this patient for 1 week f/u with midlevel for admission for recurrent PNA.   Thanks   -------------------------------------------------------- Pt is still currently admitted. Will follow up.

## 2018-08-11 NOTE — Progress Notes (Signed)
PROGRESS NOTE    WIRT HEMMERICH  ZOX:[id number] DOB: Sep 11, 1960 DOA: 08/09/2018 PCP: Scot Jun, FNP   Brief Narrative:  HPI on 08/09/2018 by Dr. Karmen Bongo James [name] is a 58 y.o. male with medical history significant of COPD and ETOH dependence presenting with respiratory distress.  He woke up about 530 this AM and couldn't breathe.  This just keeps happening.  He felt similarly when hospitalized last week.  He has been doing fine until today.  No h/o OSA, not on CPAP.  Not on home O2.  +cough, productive of yellowish sputum.  It is sometimes hard to get the sputum up.  No fever.  +wheezing since his last hospitalization.  Quit smoking in 2014.  He [place] in a non-air-conditioned log cabin.  He was hospitalized from 7/27-28 for acute on chronic respiratory failure associated with COPD exacerbation and PNA.  This was his 4th admission this year.  He was seen by [name] on 7/30 with plan for completion of course of Keflex and prednisone, as well as prednisone taper.    Interim history Admitted acute hypoxic resp failure. Pulm consulted, s/p bronchoscopy.  Assessment & Plan   Acute on chronic hypoxic respiratory failure/sepsis due to multifocal pneumonia -Was recently hospitalized in late July for pneumonia and COPD exacerbation.  Was discharged with antibiotics as well as prednisone taper.  Patient followed up with [name] on 08/03/2018 with completion of Keflex and prednisone as well. -This is his fourth admission this year for similar presentation -Patient presented with cough and shortness of breath -Chest x-ray on admission findings consistent with right-sided multifocal pneumonia -CTA chest showed multifocal pneumonia on the right.  COPD including emphysema.  Negative for PE. -COVID negative -Upon admission, patient had leukocytosis, fever, tachycardia, tachypnea- all improving now -Noted to be hypoxic 88% on room air in the ED -Pulmonology consulted and appreciated  planning on bronchoscopy today.  Continue broad-spectrum antibiotics, breathing treatments and steroids -Urine strep pneumonia and legionella antigens negative -Sputum culture: Few GPC, GVR, rare yeast -Blood cultures show no growth -Continue vancomycin, Zosyn, Solu-Medrol, supplemental oxygen -S/p bronchoscopy 8/6, pending culture results -CXR obtained after bronch on 8/6: almost complete clearing of the hazy infiltrates in the right lung  COPD -Continue nebulizer treatments, steroids, inhalers -Incentive spirometry, flutter valve  Alcohol/marijuana use -Discussed cessation -Continue CIWA protocol  Essential hypertension -Continue Cozaar  DVT Prophylaxis  lovenox  Code Status: Full  Family Communication: None at bedside  Disposition Plan: Admitted. Pending further pulmonology recommendations as well as improvement in respiratory status.  Consultants PCCM  Procedures  Bronchoscopy  Antibiotics   Anti-infectives (From admission, onward)   Start     Dose/Rate Route Frequency Ordered Stop   08/10/18 1330  vancomycin (VANCOCIN) IVPB 750 mg/150 ml premix     750 mg 150 mL/hr over 60 Minutes Intravenous Every 8 hours 08/10/18 1319     08/10/18 1330  piperacillin-tazobactam (ZOSYN) IVPB 3.375 g     3.375 g 12.5 mL/hr over 240 Minutes Intravenous Every 8 hours 08/10/18 1319     08/09/18 1800  vancomycin (VANCOCIN) IVPB 750 mg/150 ml premix  Status:  Discontinued     750 mg 150 mL/hr over 60 Minutes Intravenous Every 8 hours 08/09/18 0847 08/10/18 1319   08/09/18 1800  piperacillin-tazobactam (ZOSYN) IVPB 3.375 g  Status:  Discontinued     3.375 g 12.5 mL/hr over 240 Minutes Intravenous Every 8 hours 08/09/18 1113 08/10/18 1319   08/09/18 1130  piperacillin-tazobactam (  ZOSYN) IVPB 3.375 g  Status:  Discontinued     3.375 g 100 mL/hr over 30 Minutes Intravenous  Once 08/09/18 1111 08/10/18 1306   08/09/18 0845  vancomycin (VANCOCIN) 1,500 mg in sodium chloride 0.9 % 500 mL  IVPB     1,500 mg 250 mL/hr over 120 Minutes Intravenous  Once 08/09/18 0830 08/09/18 1443   08/09/18 0830  vancomycin (VANCOCIN) IVPB 1000 mg/200 mL premix  Status:  Discontinued     1,000 mg 200 mL/hr over 60 Minutes Intravenous  Once 08/09/18 0822 08/09/18 0830   08/09/18 0830  cefTRIAXone (ROCEPHIN) 2 g in sodium chloride 0.9 % 100 mL IVPB     2 g 200 mL/hr over 30 Minutes Intravenous  Once 08/09/18 0822 08/09/18 1000   08/09/18 0830  azithromycin (ZITHROMAX) 500 mg in sodium chloride 0.9 % 250 mL IVPB     500 mg 250 mL/hr over 60 Minutes Intravenous  Once 08/09/18 8453 08/09/18 1229      Subjective:   Ori Trejos seen and examined today.  Feels breathing has not improved and the same as previous day. Continues to cough, with yellow sputum production. Denies chest pain, abdominal pain, N/V/D/C, dizziness, headache. Would really like to be left alone to eat breakfast.   Objective:   Vitals:   08/11/18 0228 08/11/18 0542 08/11/18 0706 08/11/18 0741  BP:  (!) 145/81    Pulse:  65    Resp:  20    Temp:  98.4 F (36.9 C)    TempSrc:  Oral    SpO2:  96%  98%  Weight: 85.6 kg  85.8 kg   Height:        Intake/Output Summary (Last 24 hours) at 08/11/2018 0847 Last data filed at 08/11/2018 0716 Gross per 24 hour  Intake 2596.33 ml  Output 1200 ml  Net 1396.33 ml   Filed Weights   08/10/18 0703 08/11/18 0228 08/11/18 0706  Weight: 84.2 kg 85.6 kg 85.8 kg   Exam  General: Well developed, well nourished, NAD, appears stated age  42: NCAT,  mucous membranes moist.   Cardiovascular: S1 S2 auscultated, RRR, no murmur  Respiratory: Diminished breath sounds, no wheezing   Abdomen: Soft, nontender, nondistended, + bowel sounds  Extremities: warm dry without cyanosis clubbing or edema  Neuro: AAOx3, nonfocal  Psych: Flat  Data Reviewed: I have personally reviewed following labs and imaging studies  CBC: Recent Labs  Lab 08/09/18 0944 08/10/18 0450 08/11/18 0458   WBC 25.7* 19.9* 15.9*  NEUTROABS 24.4* 18.6*  --   HGB 13.9 13.5 13.4  HCT 42.6 40.6 41.5  MCV 99.3 100.2* 101.5*  PLT 230 215 646   Basic Metabolic Panel: Recent Labs  Lab 08/09/18 0617 08/10/18 0450 08/11/18 0458  NA 143 137 138  K 3.7 4.2 4.2  CL 103 99 102  CO2 _0 GLUCOSE 111* 184* 195*  BUN _1 CREATININE 0.93 0.78 0.86  CALCIUM 9.4 8.7* 8.7*   GFR: Estimated Creatinine Clearance: 102.8 mL/min (by C-G formula based on SCr of 0.86 mg/dL). Liver Function Tests: No results for input(s): AST, ALT, ALKPHOS, BILITOT, PROT, ALBUMIN in the last 168 hours. No results for input(s): LIPASE, AMYLASE in the last 168 hours. No results for input(s): AMMONIA in the last 168 hours. Coagulation Profile: Recent Labs  Lab 08/09/18 1320  INR 1.1   Cardiac Enzymes: No results for input(s): CKTOTAL, CKMB, CKMBINDEX, TROPONINI in the last 168 hours. BNP (last 3  results) No results for input(s): PROBNP in the last 8760 hours. HbA1C: No results for input(s): HGBA1C in the last 72 hours. CBG: No results for input(s): GLUCAP in the last 168 hours. Lipid Profile: No results for input(s): CHOL, HDL, LDLCALC, TRIG, CHOLHDL, LDLDIRECT in the last 72 hours. Thyroid Function Tests: No results for input(s): TSH, T4TOTAL, FREET4, T3FREE, THYROIDAB in the last 72 hours. Anemia Panel: No results for input(s): VITAMINB12, FOLATE, FERRITIN, TIBC, IRON, RETICCTPCT in the last 72 hours. Urine analysis:    Component Value Date/Time   COLORURINE YELLOW 04/06/2016 1307   APPEARANCEUR CLEAR 04/06/2016 1307   LABSPEC 1.012 04/06/2016 1307   PHURINE 5.0 04/06/2016 1307   GLUCOSEU >=500 (A) 04/06/2016 1307   HGBUR NEGATIVE 04/06/2016 1307   BILIRUBINUR NEGATIVE 04/06/2016 1307   KETONESUR NEGATIVE 04/06/2016 1307   PROTEINUR NEGATIVE 04/06/2016 1307   UROBILINOGEN 4.0 (H) 06/15/2013 1030   NITRITE NEGATIVE 04/06/2016 1307   LEUKOCYTESUR NEGATIVE 04/06/2016 1307   Sepsis Labs:  _0 (procalcitonin:4,lacticidven:4)  ) Recent Results (from the past 240 hour(s))  SARS Coronavirus 2 [facility] order, Performed in [facility] hospital lab) Nasopharyngeal Nasopharyngeal Swab     Status: None   Collection Time: 08/09/18  6:56 AM   Specimen: Nasopharyngeal Swab  Result Value Ref Range Status   SARS Coronavirus 2 NEGATIVE NEGATIVE Final    Comment: (NOTE) If result is NEGATIVE SARS-CoV-2 target nucleic acids are NOT DETECTED. The SARS-CoV-2 RNA is generally detectable in upper and lower  respiratory specimens during the acute phase of infection. The lowest  concentration of SARS-CoV-2 viral copies this assay can detect is 250  copies / mL. A negative result does not preclude SARS-CoV-2 infection  and should not be used as the sole basis for treatment or other  patient management decisions.  A negative result may occur with  improper specimen collection / handling, submission of specimen other  than nasopharyngeal swab, presence of viral mutation(s) within the  areas targeted by this assay, and inadequate number of viral copies  (<250 copies / mL). A negative result must be combined with clinical  observations, patient history, and epidemiological information. If result is POSITIVE SARS-CoV-2 target nucleic acids are DETECTED. The SARS-CoV-2 RNA is generally detectable in upper and lower  respiratory specimens dur ing the acute phase of infection.  Positive  results are indicative of active infection with SARS-CoV-2.  Clinical  correlation with patient history and other diagnostic information is  necessary to determine patient infection status.  Positive results do  not rule out bacterial infection or co-infection with other viruses. If result is PRESUMPTIVE POSTIVE SARS-CoV-2 nucleic acids MAY BE PRESENT.   A presumptive positive result was obtained on the submitted specimen  and confirmed on repeat testing.  While 2019 novel coronavirus  (SARS-CoV-2)  nucleic acids may be present in the submitted sample  additional confirmatory testing may be necessary for epidemiological  and / or clinical management purposes  to differentiate between  SARS-CoV-2 and other Sarbecovirus currently known to infect humans.  If clinically indicated additional testing with an alternate test  methodology [id number]) is advised. The SARS-CoV-2 RNA is generally  detectable in upper and lower respiratory sp ecimens during the acute  phase of infection. The expected result is Negative. Fact Sheet for Patients:  StrictlyIdeas.no Fact Sheet for Healthcare Providers: BankingDealers.co.za This test is not yet approved or cleared by the [place] FDA and has been authorized for detection and/or diagnosis of SARS-CoV-2 by FDA under an Emergency Use  Authorization (EUA).  This EUA will remain in effect (meaning this test can be used) for the duration of the COVID-19 declaration under Section 564(b)(1) of the Act, 21 U.S.C. section 360bbb-3(b)(1), unless the authorization is terminated or revoked sooner. Performed at [facility], [place] [address] [place], [place] [address]   Blood culture (routine x 2)     Status: None (Preliminary result)   Collection Time: 08/09/18  7:55 AM   Specimen: BLOOD LEFT HAND  Result Value Ref Range Status   Specimen Description BLOOD LEFT HAND  Final   Special Requests   Final    BOTTLES DRAWN AEROBIC AND ANAEROBIC Blood Culture results may not be optimal due to an inadequate volume of blood received in culture bottles   Culture   Final    NO GROWTH 1 DAY Performed at [facility], [place] [address], [place], [place] [address]    Report Status PENDING  Incomplete  Blood culture (routine x 2)     Status: None (Preliminary result)   Collection Time: 08/09/18  7:55 AM   Specimen: BLOOD  Result Value Ref Range Status   Specimen Description BLOOD SITE NOT SPECIFIED  Final    Special Requests   Final    BOTTLES DRAWN AEROBIC AND ANAEROBIC Blood Culture results may not be optimal due to an inadequate volume of blood received in culture bottles   Culture   Final    NO GROWTH 1 DAY Performed at [facility], [place] [address], [place], [place] [address]    Report Status PENDING  Incomplete  Culture, sputum-assessment     Status: None   Collection Time: 08/09/18  3:00 PM   Specimen: Expectorated Sputum  Result Value Ref Range Status   Specimen Description EXPECTORATED SPUTUM  Final   Special Requests NONE  Final   Sputum evaluation   Final    THIS SPECIMEN IS ACCEPTABLE FOR SPUTUM CULTURE Performed at [facility], [place] [address] [place], [place] [address]    Report Status 08/09/2018 FINAL  Final  Culture, respiratory     Status: None (Preliminary result)   Collection Time: 08/09/18  3:00 PM  Result Value Ref Range Status   Specimen Description EXPECTORATED SPUTUM  Final   Special Requests NONE Reflexed from [id number]  Final   Gram Stain   Final    ABUNDANT WBC PRESENT,BOTH PMN AND MONONUCLEAR FEW GRAM POSITIVE COCCI FEW GRAM VARIABLE ROD RARE YEAST    Culture   Final    CULTURE REINCUBATED FOR BETTER GROWTH Performed at [facility], [place] [address] [place], [place] [address]    Report Status PENDING  Incomplete  Culture, respiratory (non-expectorated)     Status: None (Preliminary result)   Collection Time: 08/10/18 10:22 AM   Specimen: Bronchoalveolar Lavage; Respiratory  Result Value Ref Range Status   Specimen Description BRONCHIAL ALVEOLAR LAVAGE RUL  Final   Special Requests Immunocompromised  Final   Gram Stain   Final    FEW WBC PRESENT, PREDOMINANTLY PMN RARE GRAM POSITIVE COCCI IN CLUSTERS RARE GRAM NEGATIVE COCCOBACILLI Performed at [facility], [address], [place], [place] [address]    Culture PENDING  Incomplete   Report Status PENDING  Incomplete  Culture, respiratory (non-expectorated)     Status: None  (Preliminary result)   Collection Time: 08/10/18 10:22 AM   Specimen: Bronchoalveolar Lavage; Respiratory  Result Value Ref Range Status   Specimen Description BRONCHIAL ALVEOLAR LAVAGE  RML  Final   Special Requests Immunocompromised  Final   Gram Stain   Final    FEW WBC PRESENT,BOTH PMN AND MONONUCLEAR NO ORGANISMS SEEN Performed at [facility], [place] [address], [place], [place] [address]    Culture PENDING  Incomplete   Report Status PENDING  Incomplete      Radiology Studies: Dg Chest 1 View  Result Date: 08/10/2018 CLINICAL DATA:  Status post right video bronchoscopy with biopsies. EXAM: CHEST  1 VIEW COMPARISON:  08/09/2018 FINDINGS: There is no evidence of pneumothorax after bronchoscopy. Heart size and pulmonary vascularity are normal. The hazy infiltrates in the right lung have almost resolved. Left lung is clear. Chronic blunting of the costophrenic angles on the left. No acute bone abnormality. Old left clavicle fracture. Old bilateral rib fractures. IMPRESSION: 1. No pneumothorax after bronchoscopy. 2. Almost complete clearing of the hazy infiltrates in the right lung. Electronically Signed   By: Lorriane Shire M.D.   On: 08/10/2018 12:57   Dg C-arm Bronchoscopy  Result Date: 08/10/2018 C-ARM BRONCHOSCOPY: Fluoroscopy was utilized by the requesting physician.  No radiographic interpretation.     Scheduled Meds: . enoxaparin (LOVENOX) injection  40 mg Subcutaneous Q24H  . folic acid  1 mg Oral Daily  . guaiFENesin  600 mg Oral BID  . losartan  100 mg Oral Daily  . methylPREDNISolone (SOLU-MEDROL) injection  60 mg Intravenous Q12H  . mometasone-formoterol  2 puff Inhalation BID  . multivitamin with minerals  1 tablet Oral Daily  . pantoprazole  40 mg Oral BID  . thiamine  100 mg Oral Daily   Or  . thiamine  100 mg Intravenous Daily  . umeclidinium bromide  1 puff Inhalation Daily   Continuous Infusions: . sodium chloride Stopped (08/11/18 0832)  . sodium  chloride 10 mL/hr at 08/11/18 0200  . piperacillin-tazobactam (ZOSYN)  IV 3.375 g (08/11/18 0501)  . vancomycin 750 mg (08/11/18 0502)     LOS: 2 days   Time Spent in minutes   30 minutes  Inessa Wardrop D.O. on 08/11/2018 at 8:47 AM  Between 7am to 7pm - Please see pager noted on [email or web address]  After 7pm go to [email or web address]  And look for the night coverage person covering for me after hours  [facility] Office  [phone]

## 2018-08-11 NOTE — Discharge Instructions (Signed)
Chronic Obstructive Pulmonary Disease °Chronic obstructive pulmonary disease (COPD) is a long-term (chronic) lung problem. When you have COPD, it is hard for air to get in and out of your lungs. Usually the condition gets worse over time, and your lungs will never return to normal. There are things you can do to keep yourself as healthy as possible. °· Your doctor may treat your condition with: °? Medicines. °? Oxygen. °? Lung surgery. °· Your doctor may also recommend: °? Rehabilitation. This includes steps to make your body work better. It may involve a team of specialists. °? Quitting smoking, if you smoke. °? Exercise and changes to your diet. °? Comfort measures (palliative care). °Follow these instructions at home: °Medicines °· Take over-the-counter and prescription medicines only as told by your doctor. °· Talk to your doctor before taking any cough or allergy medicines. You may need to avoid medicines that cause your lungs to be dry. °Lifestyle °· If you smoke, stop. Smoking makes the problem worse. If you need help quitting, ask your doctor. °· Avoid being around things that make your breathing worse. This may include smoke, chemicals, and fumes. °· Stay active, but remember to rest as well. °· Learn and use tips on how to relax. °· Make sure you get enough sleep. Most adults need at least 7 hours of sleep every night. °· Eat healthy foods. Eat smaller meals more often. Rest before meals. °Controlled breathing °Learn and use tips on how to control your breathing as told by your doctor. Try: °· Breathing in (inhaling) through your nose for 1 second. Then, pucker your lips and breath out (exhale) through your lips for 2 seconds. °· Putting one hand on your belly (abdomen). Breathe in slowly through your nose for 1 second. Your hand on your belly should move out. Pucker your lips and breathe out slowly through your lips. Your hand on your belly should move in as you breathe out. ° °Controlled coughing °Learn  and use controlled coughing to clear mucus from your lungs. Follow these steps: °1. Lean your head a little forward. °2. Breathe in deeply. °3. Try to hold your breath for 3 seconds. °4. Keep your mouth slightly open while coughing 2 times. °5. Spit any mucus out into a tissue. °6. Rest and do the steps again 1 or 2 times as needed. °General instructions °· Make sure you get all the shots (vaccines) that your doctor recommends. Ask your doctor about a flu shot and a pneumonia shot. °· Use oxygen therapy and pulmonary rehabilitation if told by your doctor. If you need home oxygen therapy, ask your doctor if you should buy a tool to measure your oxygen level (oximeter). °· Make a COPD action plan with your doctor. This helps you to know what to do if you feel worse than usual. °· Manage any other conditions you have as told by your doctor. °· Avoid going outside when it is very hot, cold, or humid. °· Avoid people who have a sickness you can catch (contagious). °· Keep all follow-up visits as told by your doctor. This is important. °Contact a doctor if: °· You cough up more mucus than usual. °· There is a change in the color or thickness of the mucus. °· It is harder to breathe than usual. °· Your breathing is faster than usual. °· You have trouble sleeping. °· You need to use your medicines more often than usual. °· You have trouble doing your normal activities such as getting dressed   or walking around the house. °Get help right away if: °· You have shortness of breath while resting. °· You have shortness of breath that stops you from: °? Being able to talk. °? Doing normal activities. °· Your chest hurts for longer than 5 minutes. °· Your skin color is more blue than usual. °· Your pulse oximeter shows that you have low oxygen for longer than 5 minutes. °· You have a fever. °· You feel too tired to breathe normally. °Summary °· Chronic obstructive pulmonary disease (COPD) is a long-term lung problem. °· The way your  lungs work will never return to normal. Usually the condition gets worse over time. There are things you can do to keep yourself as healthy as possible. °· Take over-the-counter and prescription medicines only as told by your doctor. °· If you smoke, stop. Smoking makes the problem worse. °This information is not intended to replace advice given to you by your health care provider. Make sure you discuss any questions you have with your health care provider. °Document Released: 06/09/2007 Document Revised: 12/03/2016 Document Reviewed: 01/26/2016 °Elsevier Patient Education © 2020 [employer] ° °

## 2018-08-12 LAB — CULTURE, RESPIRATORY W GRAM STAIN
Culture: NORMAL
Culture: NORMAL

## 2018-08-13 ENCOUNTER — Telehealth: Payer: Self-pay

## 2018-08-13 NOTE — Telephone Encounter (Signed)
Called and LM on Vm to call back to [phone].

## 2018-08-14 ENCOUNTER — Telehealth: Payer: Self-pay

## 2018-08-14 LAB — CULTURE, BLOOD (ROUTINE X 2)
Culture: NO GROWTH
Culture: NO GROWTH

## 2018-08-14 NOTE — Telephone Encounter (Signed)
Transition Care Management Follow-up Telephone Call  The call was completed with patient's sister, [name] - DPR on file to speak to her. The patient was not available.     Date of discharge and from where: 08/11/2018, [facility]   How have you been since you were released from the hospital? His sister stated that he is feeling okay.   Any questions or concerns?her concerns were regarding obtaining an ID for him. He has no social security card; but knows his social security number.  He has no birth certificate , DMV ID.  She also noted that he is not able to read, spell or do addition. Provided her with the contact # for [facility] regarding ID. Also informed her that this CM is checking with Legal Aid regarding her questions about an ID for her brother.   Items Reviewed:  Did the pt receive and understand the discharge instructions provided? She said that he has the instructions and she did not have any questions.   Medications obtained and verified? She still needs to obtain the new medications for him - augmentin, mucinex and multivitamins.  The new prednisone orders were reviewed and she said that he has been taking 40 mg daily.  She explained how she sets up his medication instructions for him in very simple terms that he is able to understands as he does not read.  She said that he tells her that he is taking his medications as per her instructions. She didn't have any questions about the medication regime.    Any new allergies since your discharge? None reported   Do you have support at home? His sister is his main support and lives about 1 mile away from him.  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 They help him pay pay for utility bills in exchange for work.   Other (ie: DME, Home Health, etc) no home health ordered.   he needs to complete application for [employer] and [employer] . He has a Contractor on file for medication  assistance but that expired 08/04/2018. He will need to re-apply when an ID is obtained.  He is stll eligible for medications through the [facility]. Informed her that the cost for augmentin is $10.    Needs aerochamber. Informed her that [facility] pharmacy does not have aerochambers    He has no insurance and no income. Needs to apply for disability.   Has nebulizer and flutter device. She said that he uses the nebulizer twice daily.   Functional Questionnaire: (I = Independent and D = Dependent) ADL's: independent, sister assists with medication management    Follow up appointments reviewed:    PCP Hospital f/u appt confirmed? Pt has appointment with [name] 08/24/2018 and his sister would like him to keep that appointment and not schedule an appointment any sooner.   Specialist appointments? Dr Melvyn Novas 08/28/2018  Are transportation arrangements needed? No, his sister stated that she can drive him to the clinic.   If their condition worsens, is the pt aware to call  their PCP or go to the ED? Yes  Was the patient provided with contact information for the PCP's office or ED? His sister has the clinic phone number  Was the pt encouraged to call back with questions or concerns? yes

## 2018-08-14 NOTE — Telephone Encounter (Signed)
Addendum to prior note: his sister reports that his BP today is 118/80 and O2 sat: 93%

## 2018-08-14 NOTE — Telephone Encounter (Signed)
Pt has a f/u appt scheduled with [facility] 8/24 but this is for a 3 month f/u.   Pt has been discharged from  Hospital. While pt was in hospital, covid test was performed and the results were negative.   Dr. Melvyn Novas, based on the message that was received from Dr. Tamala Julian, please advise if pt's currently scheduled appt with you on 8/24 will be okay for pt to keep or if we should move pt's appt up sooner with you?

## 2018-08-15 NOTE — Telephone Encounter (Signed)
Attempted to call pt but unable to reach. Left message for pt to return call. 

## 2018-08-15 NOTE — Telephone Encounter (Signed)
Looks like he was ok with mid- level eval so that's fine or you can call pt  and if doing ok wait until 8/24/

## 2018-08-16 NOTE — Telephone Encounter (Signed)
Called pt and spoke with his sister to see how pt was doing. Per [name], pt has been doing well since being out of the hospital. Stated to her that we had received a call from Dr. Tamala Julian about getting pt in for an appt 1 week after hospitalization or we could keep his current scheduled appt with MW as long as pt was doing well. [name] said that they will just keep pt's current scheduled appt. Nothing further needed.

## 2018-08-21 NOTE — Progress Notes (Deleted)
Subjective:    James ID: James [name], male    DOB: Aug 01, 1960, 58 y.o.   MRN: [id number]  58 y.o.M with advanced Copd   I saw this James a week ago and prescribed antibiotics and prednisone for what I perceived to be persisting pneumonia.  The James had a slight improvement but then worsened and came to the emergency room the end of the week and was admitted for 3 days for COPD exacerbation and lower lobe pneumonia.  Chest x-ray did show persistent infiltrates in the lower lobes.  The James is now discharged and returns to the office for post hospital follow-up.  Below is the discharge summary Dc summary : Admit date: 07/31/2018 Discharge date: 08/01/2018  Time spent: 45 minutes  Recommendations for Outpatient Follow-up:  James will be discharged to home.  James will need to follow up with primary care provider within one week of discharge.  James should continue medications as prescribed.  James should follow a heart healthy diet.   Discharge Diagnoses:  Acute on chronic respiratory failure with hypoxia secondary to COPD Exacerbation and pneumonia Essential hypertension  Discharge Condition: Stable  Diet recommendation: heart healthy  Filed Weights  07/31/18 0231 07/31/18 0602 Weight: 85.3 kg 83.4 kg   History of present illness:  on 07/31/2018 by Dr. [name] a 58 y.o.malewith medical history significant ofCOPD. James has had 3 prior admits for COPD / PNA thus far this year, most recently in May.  James has been being treated for COPD exacerbation and BLL CAP as outpatient by [name] with levaquin and prednisone. Just finished levaquin yesterday. Prednisone remained at 40mg  daily with plans to initiate a slow taper.  Despite this, James developed severe SOB and respiratory distress this evening. EMS called and James noted to be satting 80% on RA. He is not on chronic O2 for his COPD.  Hospital Course:   Acute on chronic respiratory failure with hypoxia secondary to COPD Exacerbation and pneumonia -James noted to have oxygen saturations in the 80s on room air -Chest x-ray noted for pneumonia -Was recently placed on Levaquin and steroids by PCP approximately 1 week ago -On admission, James continued to have wheezing well into his hospital stay day 1.  James currently still has some wheezing however has improved. -Was placed on Solu-Medrol, along with nebulizer treatments, azithromycin and ceftriaxone -James did require oxygen upon admission however has been able to ambulate and maintain oxygen saturations in the high 90s on room air. -Will discharge James with prednisone taper, azithromycin and Ceftin. -Given that James had oxygen saturations in the 80s on admission, it was thought that he would require several days in the hospital. James actually improved quicker than expected.  Essential hypertension -Continue losartan  Since discharge the James is tried to go back to work but has extreme difficulty in the heat with this.  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  TCC:   Date of discharge and from where: 08/11/2018, [facility]   How have you been since you were released from the hospital? His sister stated that he is feeling okay.  Any questions or concerns?her concerns were regarding obtaining an ID for him. He has no social security card; but knows his social security number.  He has no birth certificate , DMV ID.  She also noted that he is not able to read, spell or do addition. Provided her with the contact # for [facility] regarding ID. Also  informed her that this CM is checking with Legal Aid regarding her questions about an ID for her brother.   Items Reviewed:  Did the pt receive and understand the discharge instructions provided? She said that he has the instructions and she did not have any questions.   Medications obtained and verified? She still needs to obtain the new medications for him - augmentin, mucinex and multivitamins.  The new prednisone orders were reviewed and she said that he has been taking 40 mg daily.  She explained how she sets up his medication instructions for him in very simple terms that he is able to understands as he does not read.  She said that he tells her that he is taking his medications as per her instructions. She didn't have any questions about the medication regime.    Any new allergies since your discharge? None reported   Do you have support at home? His sister is his main support and lives about 1 mile away from him.  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 They help him pay pay for utility bills in exchange for work.   Other (ie: DME, Home Health, etc) no home health ordered.   he needs to complete application for [employer] and [employer] . He has a Contractor on file for medication assistance but that expired 08/04/2018. He will need to re-apply when an ID is obtained.  He is stll eligible for medications through the [facility]. Informed her that the cost for augmentin is $10.    Needs aerochamber. Informed her that [facility] pharmacy does not have aerochambers    He has no insurance and no income. Needs to apply for disability.   Has nebulizer and flutter device. She said that he uses the nebulizer twice daily.   Functional Questionnaire: (I = Independent and D = Dependent) ADL's: independent, sister assists with medication management    Follow up appointments reviewed:    PCP Hospital f/u appt confirmed? Pt has appointment  with [name] 08/24/2018 and his sister would like him to keep that appointment and not schedule an appointment any sooner.   Specialist appointments? Dr Melvyn Novas 08/28/2018  Are transportation arrangements needed? No, his sister stated that she can drive him to the clinic.   If their condition worsens, is the pt aware to call  their PCP or go to the ED? Yes  Was the James provided with contact information for the PCP's office or ED? His sister has the clinic phone number  Was the pt encouraged to call back with questions or concerns? yes       Shortness of Breath This is a chronic problem. The current episode started more than 1 year ago. The problem occurs daily (qhs shortness of breath). The problem has been gradually worsening. Associated symptoms include chest pain, PND and wheezing. Pertinent negatives include no claudication, fever, headaches, hemoptysis, leg pain, leg swelling, orthopnea, rash, sore throat, sputum production, syncope or vomiting. The symptoms are aggravated by any activity and weather changes. Associated symptoms comments: Pain radiates to arm Legs go to sleep No  dysphagia No gerd . Risk factors include smoking. He has tried beta agonist inhalers, oral steroids and steroid inhalers for the symptoms. The treatment provided moderate relief. His past medical history is significant for COPD and pneumonia. There is no history of asthma, CAD, DVT, a heart failure or PE.    Past Medical History:  Diagnosis Date  . Acute on chronic respiratory failure with hypoxia ([place]) 06/08/2013  . COPD (chronic obstructive pulmonary disease) ([place])    not on home  . Pneumonia 04/06/2016  . Pneumothorax    2016, fell from ladder  . Tick bite 06/03/2018     Family History  Problem Relation Age of Onset  . Heart disease Father   . COPD Sister      Social History   Socioeconomic History  . Marital status: Divorced    Spouse name: Not on file  . Number of children: Not on file  .  Years of education: Not on file  . Highest education level: Not on file  Occupational History  . Occupation: Dealer  . Occupation: [employer]  . Financial resource strain: Not on file  . Food insecurity    Worry: Not on file    Inability: Not on file  . Transportation needs    Medical: Not on file    Non-medical: Not on file  Tobacco Use  . Smoking status: Former Smoker    Packs/day: 1.50    Years: 40.00    Pack years: 60.00    Types: Cigarettes    Quit date: 2014    Years since quitting: 6.6  . Smokeless tobacco: Current User    Types: Chew  Substance and Sexual Activity  . Alcohol use: Yes    Alcohol/week: 28.0 standard drinks    Types: 28 Cans of beer per week    Comment: 4 beers a night; + jitters with not drinking, denies DTs or seizures  . Drug use: Yes    Types: Marijuana    Comment: daily; quit using crack and methamphetamine about 5 months ago   . Sexual activity: Not Currently    Partners: Female  Lifestyle  . Physical activity    Days per week: Not on file    Minutes per session: Not on file  . Stress: Not on file  Relationships  . Social Herbalist on phone: Not on file    Gets together: Not on file    Attends religious service: Not on file    Active member of club or organization: Not on file    Attends meetings of clubs or organizations: Not on file    Relationship status: Not on file  . Intimate partner violence    Fear of current or ex partner: Not on file    Emotionally abused: Not on file    Physically abused: Not on file    Forced sexual activity: Not on file  Other Topics Concern  . Not on file  Social History Narrative   Lives alone near [place]     No Known Allergies   Outpatient Medications Prior to Visit  Medication Sig Dispense Refill  . albuterol (PROVENTIL HFA;VENTOLIN HFA) 108 (90 Base) MCG/ACT inhaler Inhale 1-2 puffs into the lungs every 6 (six) hours as needed for wheezing or shortness of breath. 1  Inhaler 5  . albuterol (PROVENTIL) (2.5 MG/3ML) 0.083% nebulizer solution Take 3 mLs (2.5 mg total) by nebulization every 6 (six) hours as needed for wheezing or shortness  of breath. 335 mL 1  . folic acid (FOLVITE) 1 MG tablet Take 1 tablet (1 mg total) by mouth daily. 90 tablet 0  . guaiFENesin (MUCINEX) 600 MG 12 hr tablet Take 1 tablet (600 mg total) by mouth 2 (two) times daily.    [name] losartan (COZAAR) 100 MG tablet Take 1 tablet (100 mg total) by mouth daily. 30 tablet 3  . mometasone-formoterol (DULERA) 200-5 MCG/ACT AERO Inhale 2 puffs into the lungs 2 (two) times daily. 13 g 2  . Multiple Vitamin (MULTIVITAMIN WITH MINERALS) TABS tablet Take 1 tablet by mouth daily.    . pantoprazole (PROTONIX) 40 MG tablet Take 40 mg by mouth daily.    . predniSONE (DELTASONE) 20 MG tablet Take 2 tablets (40 mg total) by mouth daily with breakfast for 14 days. 28 tablet 0  . Respiratory Therapy Supplies (FLUTTER) DEVI Use 4 times daily 1 each 0  . Spacer/Aero-Holding Chambers (AEROCHAMBER MV) inhaler Use as instructed 1 each 0  . thiamine (VITAMIN B-1) 100 MG tablet Take 1 tablet (100 mg total) by mouth daily. 30 tablet 3  . umeclidinium bromide (INCRUSE ELLIPTA) 62.5 MCG/INH AEPB Inhale 1 puff into the lungs daily. 30 each 5   No facility-administered medications prior to visit.      Review of Systems  Constitutional: Negative for fever.  HENT: Negative for sinus pressure, sinus pain, sore throat and trouble swallowing.   Eyes: Negative.   Respiratory: Positive for shortness of breath and wheezing. Negative for hemoptysis and sputum production.   Cardiovascular: Positive for chest pain and PND. Negative for palpitations, orthopnea, claudication, leg swelling and syncope.  Gastrointestinal: Negative for nausea and vomiting.  Genitourinary: Negative.   Musculoskeletal: Negative.   Skin: Negative for rash.  Neurological: Negative for headaches.  Psychiatric/Behavioral: The James is not  nervous/anxious and is not hyperactive.        Objective:   Physical Exam There were no vitals filed for this visit. 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  ENT: No lesions,  mouth clear,  oropharynx clear, no postnasal drip  Neck: No JVD, no TMG, no carotid bruits  Lungs: No use of accessory muscles, no dullness to percussion, distant breath sounds with improved breath sounds from the last office visit without wheezes  Cardiovascular: RRR, heart sounds normal, no murmur or gallops, no peripheral edema  Abdomen: soft and NT, no HSM,  BS normal  Musculoskeletal: No deformities, no cyanosis or clubbing  Neuro: alert, non focal  Skin: Warm, no lesions or rashes  7/14 CXR IMPRESSION: Stable mild hazy opacity of right lung base and interval developed patchy opacity of medial left lung base suspicious for pneumonia.  BMP Latest Ref Rng & Units 08/11/2018 08/10/2018 08/09/2018  Glucose 70 - 99 mg/dL 195(H) 184(H) 111(H)  BUN 6 - 20 mg/dL 16 17 14   Creatinine 0.61 - 1.24 mg/dL 0.86 0.78 0.93  BUN/Creat Ratio 9 - 20 - - -  Sodium 135 - 145 mmol/L 138 137 143  Potassium 3.5 - 5.1 mmol/L 4.2 4.2 3.7  Chloride 98 - 111 mmol/L 102 99 103  CO2 22 - 32 mmol/L 27 26 27   Calcium 8.9 - 10.3 mg/dL 8.7(L) 8.7(L) 9.4   Hepatic Function Panel  Component Value Date/Time   PROT 6.0 06/20/2018 1159   ALBUMIN 4.1 06/20/2018 1159   AST 34 06/20/2018 1159   ALT 37 06/20/2018 1159   ALT 62 (H) 06/23/2016 1226   ALKPHOS 43 06/20/2018 1159   BILITOT 0.4 06/20/2018 1159   BILIDIR 0.12 04/16/2016 1601   IBILI NOT CALCULATED 03/26/2016 1845   CBC Latest Ref Rng & Units 08/11/2018 08/10/2018 08/09/2018  WBC 4.0 - 10.5 K/uL 15.9(H) 19.9(H) 25.7(H)  Hemoglobin 13.0 - 17.0 g/dL  13.4 13.5 13.9  Hematocrit 39.0 - 52.0 % 41.5 40.6 42.6  Platelets 150 - 400 K/uL 204 215 230        Assessment & Plan:  I personally reviewed all images and lab data in the [facility] system as well as any outside material available during this office visit and agree with the  radiology impressions.   No problem-specific Assessment & Plan notes found for this encounter.

## 2018-08-22 ENCOUNTER — Telehealth: Payer: Self-pay | Admitting: Family Medicine

## 2018-08-22 ENCOUNTER — Encounter: Payer: Self-pay | Admitting: *Deleted

## 2018-08-22 MED FILL — LOSARTAN POTASSIUM 100 MG T: 100 | 30 days supply | Qty: 30 | Fill #3

## 2018-08-22 MED FILL — predniSONE 20 MG TABS: 20 | 14 days supply | Qty: 28 | Fill #0

## 2018-08-22 MED FILL — ALBUTEROL SUL 2.5 MG/3 ML S: (2.5 MG/3ML | 12 days supply | Qty: 150 | Fill #1

## 2018-08-22 NOTE — Telephone Encounter (Signed)
Pt was sent a letter from financial dept. Inform them, that the application they submitted was incomplete, since they were missing some documentation at the time of the appointment, Pt need to reschedule and resubmit all new papers and application for CAFA and OC, P.S. old documents has been sent back to Pt and need to make a new appt

## 2018-08-23 ENCOUNTER — Ambulatory Visit: Payer: Self-pay | Admitting: Critical Care Medicine

## 2018-08-24 ENCOUNTER — Telehealth: Payer: Self-pay

## 2018-08-24 ENCOUNTER — Ambulatory Visit: Payer: Self-pay | Admitting: Critical Care Medicine

## 2018-08-24 NOTE — Telephone Encounter (Signed)
Call placed to patient's sister, [name] [phone] to inform her that this CM has an aerochamber for patient.  Message left with call back requested to # [phone]

## 2018-08-28 ENCOUNTER — Encounter: Payer: Self-pay | Admitting: Internal Medicine

## 2018-08-28 ENCOUNTER — Other Ambulatory Visit: Payer: Self-pay

## 2018-08-28 ENCOUNTER — Ambulatory Visit (INDEPENDENT_AMBULATORY_CARE_PROVIDER_SITE_OTHER): Payer: Self-pay | Admitting: Internal Medicine

## 2018-08-28 DIAGNOSIS — J449 Chronic obstructive pulmonary disease, unspecified: Secondary | ICD-10-CM

## 2018-08-28 MED ORDER — MOMETASONE FURO-FORMOTEROL FUM 200-5 MCG/ACT IN AERO: 2.0000 | INHALATION_SPRAY | Freq: Two times a day (BID) | RESPIRATORY_TRACT | 0 refills | Status: DC

## 2018-08-28 NOTE — Progress Notes (Signed)
James [name], male    DOB: [birth year],     MRN: [id number]   Brief patient profile:  14  yowm quit smoking 2015/ worked as tree/landscaping  with onset doe  around 2010 and worse since quit but did not use inhalers until 2019 > a little better after started but still having freq aecopd = 3 since 07/2017 last one:   Date of Admission: 05/17/2018    Date of Discharge: 05/18/2018    Discharge Diagnosis: 1. COPD Exacerbation  2. Right Middle Lobe Pneumonia  3. Alcohol Use Disorder 4. Hypertension   Discharge Medications: Allergies as of 05/18/2018   No Known Allergies        Medication List    TAKE these medications   albuterol (2.5 MG/3ML) 0.083% nebulizer solution Commonly known as:  PROVENTIL Take 3 mLs (2.5 mg total) by nebulization every 6 (six) hours as needed for wheezing or shortness of breath.   albuterol 108 (90 Base) MCG/ACT inhaler Commonly known as:  VENTOLIN HFA Inhale 1-2 puffs into the lungs every 6 (six) hours as needed for wheezing or shortness of breath.   amoxicillin-clavulanate 875-125 MG tablet Commonly known as:  AUGMENTIN Take 1 tablet by mouth every 12 (twelve) hours.   benzonatate 100 MG capsule Commonly known as:  TESSALON Take 1-2 capsules (100-200 mg total) by mouth 3 (three) times daily as needed for cough.   Flutter Devi Use 4 times daily   folic acid 1 MG tablet Commonly known as:  FOLVITE Take 1 tablet (1 mg total) by mouth daily.   losartan 100 MG tablet Commonly known as:  Cozaar Take 1 tablet (100 mg total) by mouth daily.   mometasone-formoterol 200-5 MCG/ACT Aero Commonly known as:  DULERA Inhale 2 puffs into the lungs 2 (two) times daily.   predniSONE 20 MG tablet Commonly known as:  DELTASONE Take 2 tablets (40 mg total) by mouth daily with breakfast for 3 days, THEN 1 tablet (20 mg total) daily with breakfast for 4 days, THEN 0.5 tablets (10 mg total) daily with breakfast for 4 days. Start taking on:  May 19, 2018 What changed:    medication strength  See the new instructions.   thiamine 100 MG tablet Take 1 tablet (100 mg total) by mouth daily.   umeclidinium bromide 62.5 MCG/INH Aepb Commonly known as:  Incruse Ellipta Inhale 1 puff into the lungs daily.       1.  COPD Exacerbation: Symptoms improved with nebulizer treatments. Recently established with pulmonology. Was placed on steroid taper but apparently was taking 20mg  prednisone daily instead of tapering. Taper was restarted with close instructions. Continued on home inhalers. Ambulated well on RA prior to discharge.  2. Right Middle Lobe Pneumonia: Leukocytosis partially secondary to being on prednisone already, started on ceftriaxone and azithro and transitioned to amoxicillin-clavulanate at discharge due to some concern for aspiration with alcohol use. Will complete 7 days abx total.   3. Alcohol Use Disorder: drinks 6pk of beer per day. He is interested in quitting and will follow-up with PCP to discuss options further. May benefit from naltrexone therapy. Denies having withdrawal previously.         History of Present Illness  05/25/2018  Pulmonary/ 1st office eval/James [name] last day of prednisone 05/26/18  Chief Complaint  Patient presents with  . COPD    exacerbation per the ER  Dyspnea:  Best days = MMRC3 = can't walk 100 yards even at a slow pace at a  flat grade s stopping due to sob   Cough: all day long /not noct/still on augmentin / min mucoid now Sleep: able to lie flat now SABA use: confused with various instructions from outpt ([name]  ) and inpt rec Plan A = Automatic = Dulera 200  2 pffs and chase with Incruse one click then repeat dulera 12 hours x 2 hours  Work on inhaler technique:   Plan B = Backup Only use your albuterol(Ventolin)  inhaler Plan C = Crisis - only use your albuterol nebulizer if you first try Plan B and it fails to help > ok to use the  nebulizer up to every 4 hours but if start needing it regularly call for immediate appointment Please remember to go to the lab department   for your tests - we will call you with the results when they are available.    08/28/2018  f/u ov/James [name] re: presumed copd/ poor activity tol maint on incruse  Chief Complaint  Patient presents with  . Follow-up    Breathing is about the same. He is coughing up yellow sputum. He is using his proair 1-2 x per day and neb every night.   Dyspnea:  Doing yardwork / maintenancye Cough: some yellow worse first thing in am  Sleeping: ok flat  SABA use: when out in heat  02: none  Cp 1-2 secs s ex component > being referred to cards    No obvious day to day or daytime variability or assoc  mucus plugs or hemoptysis or cp or chest tightness, subjective wheeze or overt sinus or hb symptoms.   Sleeping  As above without nocturnal  exacerbation  of respiratory  c/o's or need for noct saba. Also denies any obvious fluctuation of symptoms with weather or environmental changes or other aggravating or alleviating factors except as outlined above   No unusual exposure hx or h/o childhood pna/ asthma or knowledge of premature birth.  Current Allergies, Complete Past Medical History, Past Surgical History, Family History, and Social History were reviewed in [employer] record.  ROS  The following are not active complaints unless bolded Hoarseness, sore throat, dysphagia, dental problems, itching, sneezing,  nasal congestion or discharge of excess mucus or purulent secretions, ear ache,   fever, chills, sweats, unintended wt loss or wt gain, classically pleuritic or exertional cp,  orthopnea pnd or arm/hand swelling  or leg swelling, presyncope, palpitations, abdominal pain, anorexia, nausea, vomiting, diarrhea  or change in bowel habits or change in bladder habits, change in stools or change in urine, dysuria, hematuria,  rash, arthralgias, visual  complaints, headache, numbness, weakness or ataxia or problems with walking or coordination,  change in mood or  memory.        Current Meds  Medication Sig  . albuterol (PROVENTIL HFA;VENTOLIN HFA) 108 (90 Base) MCG/ACT inhaler Inhale 1-2 puffs into the lungs every 6 (six) hours as needed for wheezing or shortness of breath.  [name] albuterol (PROVENTIL) (2.5 MG/3ML) 0.083% nebulizer solution Take 3 mLs (2.5 mg total) by nebulization every 6 (six) hours as needed for wheezing or shortness of breath.  [name] amoxicillin-clavulanate (AUGMENTIN) 875-125 MG tablet Take 1 tablet by mouth 2 (two) times daily.  . calcium carbonate (TUMS - DOSED IN MG ELEMENTAL CALCIUM) 500 MG chewable tablet Chew 1 tablet by mouth daily as needed for indigestion or heartburn.  [name] guaiFENesin (MUCINEX) 600 MG 12 hr tablet Take 1 tablet (600 mg total)  by mouth 2 (two) times daily.  [name] losartan (COZAAR) 100 MG tablet Take 1 tablet (100 mg total) by mouth daily.  . mometasone-formoterol (DULERA) 200-5 MCG/ACT AERO Inhale 2 puffs into the lungs 2 (two) times daily.  . Potassium (POTASSIMIN PO) Take by mouth daily as needed.  . predniSONE (DELTASONE) 20 MG tablet Take 20 mg by mouth daily with breakfast.  . Respiratory Therapy Supplies (FLUTTER) DEVI Use 4 times daily  . Spacer/Aero-Holding Chambers (AEROCHAMBER MV) inhaler Use as instructed  . thiamine (VITAMIN B-1) 100 MG tablet Take 1 tablet (100 mg total) by mouth daily.  [name] umeclidinium bromide (INCRUSE ELLIPTA) 62.5 MCG/INH AEPB Inhale 1 puff into the lungs daily.                  Objective:    amb wm > stated age in appearance    Wt Readings from Last 3 Encounters:  08/28/18 185 lb (83.9 kg)  08/11/18 189 lb 1.6 oz (85.8 kg)  08/03/18 188 lb 12.8 oz (85.6 kg)     Vital signs reviewed - Note on arrival 02 sats  95% on RA      HEENT : pt wearing mask not removed for exam due to covid - 19 concerns.   NECK :  without JVD/Nodes/TM/ nl carotid upstrokes bilaterally    LUNGS: no acc muscle use,  Mod barrel  contour chest wall with bilateral  Distant bs s audible wheeze and  without cough on insp or exp maneuvers and mod  Hyperresonant  to  percussion bilaterally     CV:  RRR  no s3 or murmur or increase in P2, and no edema   ABD:  soft and nontender with pos mid insp Hoover's  in the supine position. No bruits or organomegaly appreciated, bowel sounds nl  MS:     ext warm without deformities, calf tenderness, cyanosis or clubbing No obvious joint restrictions   SKIN: warm and dry without lesions    NEURO:  alert, approp, nl sensorium with  no motor or cerebellar deficits apparent.               Assessment

## 2018-08-28 NOTE — Patient Instructions (Addendum)
Plan A = Automatic = Dulera 200 2 pffs and chase with Incruse one click then repeat dulera 12 hours x 2 hours   Work on inhaler technique:  relax and gently blow all the way out then take a nice smooth deep breath back in, triggering the inhaler at same time you start breathing in.  Hold for up to 5 seconds if you can. Blow out thru nose. Rinse and gargle with water when done   Plan B = Backup Only use your albuterol(Ventolin)  inhaler as a rescue medication to be used if you can't catch your breath by resting or doing a relaxed purse lip breathing pattern.  - The less you use it, the better it will work when you need it. - Ok to use the inhaler up to 2 puffs  every 4 hours if you must but call for appointment if use goes up over your usual need - Don't leave home without it !!  (think of it like the spare tire for your car)   Plan C = Crisis - only use your albuterol nebulizer if you first try Plan B and it fails to help > ok to use the nebulizer up to every 4 hours but if start needing it regularly call for immediate appointment  Walk at a slower pace if you are losing your breath with activity   Keep appt with [name] and we will see you as needed

## 2018-08-29 MED FILL — FOLIC ACID 1 MG TABS: 1 | 30 days supply | Qty: 30 | Fill #1

## 2018-08-30 ENCOUNTER — Telehealth: Payer: Self-pay

## 2018-08-30 ENCOUNTER — Encounter: Payer: Self-pay | Admitting: Internal Medicine

## 2018-08-30 NOTE — Telephone Encounter (Signed)
Call placed to patient's sister, James [name], and informed her that there is an aerochamber at the clinic for her brother.  She said that she will plan to pick it up tomorrow.   She also explained that she still needs an ID for patient in order to apply for disability and medicaid.  He knows his social security number but needs a replacement card and has no ID.  email sent to HIM to inquire if they are able to assist with providing a certified copy of patient's demographic information.

## 2018-08-30 NOTE — Progress Notes (Deleted)
Subjective:    Patient ID: James [name], male    DOB: 10-08-1960, 58 y.o.   MRN: [id number]  58 y.o.M with advanced Copd   I saw this patient a week ago and prescribed antibiotics and prednisone for what I perceived to be persisting pneumonia.  The patient had a slight improvement but then worsened and came to the emergency room the end of the week and was admitted for 3 days for COPD exacerbation and lower lobe pneumonia.  Chest x-ray did show persistent infiltrates in the lower lobes.  The patient is now discharged and returns to the office for post hospital follow-up.  Below is the discharge summary Dc summary : Admit date: 07/31/2018 Discharge date: 08/01/2018  Time spent: 45 minutes  Recommendations for Outpatient Follow-up:  Patient will be discharged to home.  Patient will need to follow up with primary care provider within one week of discharge.  Patient should continue medications as prescribed.  Patient should follow a heart healthy diet.   Discharge Diagnoses:  Acute on chronic respiratory failure with hypoxia secondary to COPD Exacerbation and pneumonia Essential hypertension  Discharge Condition: Stable  Diet recommendation: heart healthy  Filed Weights  07/31/18 0231 07/31/18 0602 Weight: 85.3 kg 83.4 kg   History of present illness:  on 07/31/2018 by Dr. [name] a 58 y.o.malewith medical history significant ofCOPD. Patient has had 3 prior admits for COPD / PNA thus far this year, most recently in May.  Patient has been being treated for COPD exacerbation and BLL CAP as outpatient by [name] with levaquin and prednisone. Just finished levaquin yesterday. Prednisone remained at 40mg  daily with plans to initiate a slow taper.  Despite this, patient developed severe SOB and respiratory distress this evening. EMS called and patient noted to be satting 80% on RA. He is not on chronic O2 for his COPD.  Hospital Course:   Acute on chronic respiratory failure with hypoxia secondary to COPD Exacerbation and pneumonia -Patient noted to have oxygen saturations in the 80s on room air -Chest x-ray noted for pneumonia -Was recently placed on Levaquin and steroids by PCP approximately 1 week ago -On admission, patient continued to have wheezing well into his hospital stay day 1.  Patient currently still has some wheezing however has improved. -Was placed on Solu-Medrol, along with nebulizer treatments, azithromycin and ceftriaxone -Patient did require oxygen upon admission however has been able to ambulate and maintain oxygen saturations in the high 90s on room air. -Will discharge patient with prednisone taper, azithromycin and Ceftin. -Given that patient had oxygen saturations in the 80s on admission, it was thought that he would require several days in the hospital. Patient actually improved quicker than expected.  Essential hypertension -Continue losartan  Since discharge the patient is tried to go back to work but has extreme difficulty in the heat with this.  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  Pt was rehospitalized once more  since I saw him second time in July OV  Dc summary  Admit date: 08/09/2018 Discharge date: 08/11/2018  Time spent: 45 minutes  Recommendations for Outpatient Follow-up:  Patient will be discharged to home.  Patient will need to follow up with primary care provider within one week of discharge.  Follow up with pulmonology. Patient should continue medications as prescribed.  Patient should follow a heart healthy diet.   Discharge Diagnoses:  Acute on chronic  hypoxic respiratory failure/sepsis due to multifocal pneumonia COPD Alcohol/marijuana use Essential hypertension  Discharge Condition: Stable  Diet recommendation: heart healthy  Filed Weights  08/10/18 0703 08/11/18 0228 08/11/18 0706 Weight: 84.2 kg 85.6 kg 85.8 kg   History of present illness:  on 08/09/2018 by Dr. [name] a 58 y.o.malewith medical history significant ofCOPD and ETOH dependence presenting with respiratory distress.He woke up about 530 this AM and couldn't breathe. This just keeps happening. He felt similarly when hospitalized last week. He has been doing fine until today. No h/o OSA, not on CPAP. Not on home O2. +cough, productive of yellowish sputum. It is sometimes hard to get the sputum up. No fever. +wheezing since his last hospitalization. Quit smoking in 2014. He lives in a non-air-conditioned log cabin.  He was hospitalized from 7/27-28 for acute on chronic respiratory failure associated with COPD exacerbation and PNA. This was his 4th admission this year. He was seen by [name] on 7/30 with plan for completion of course of Keflex and prednisone, as well as prednisone taper.  Hospital Course:  Acute on chronic hypoxic respiratory failure/sepsis due to multifocal pneumonia -Was recently hospitalized in late July for pneumonia and COPD exacerbation. Was discharged with antibiotics as well as prednisone taper. Patient followed up with [name] on 08/03/2018 with completion of Keflex and prednisone as well. -This is his fourth admission this year for similar presentation -Patient presented with cough and shortness of breath -Chest x-rayon admissionfindings consistent with right-sided multifocal pneumonia -CTA chest showed multifocal pneumonia on the right. COPD including emphysema. Negative for PE. -COVID negative -Upon admission, patient had leukocytosis, fever, tachycardia, tachypnea- all resolving  -Noted  to be hypoxic 88% on room air in the ED -Urine strep pneumoniaand legionellaantigensnegative -Sputum culture: Few GPC, GVR, rare yeast -Blood cultures show no growth -was placed on vancomycin, Zosyn, Solu-Medrol, supplemental oxygen -S/p bronchoscopy 8/6, pending culture results -CXR obtained after bronch on 8/6: almost complete clearing of the hazy infiltrates in the right lung -Discussed with Dr. Tamala Julian (pulm), recommended prednisone taper starting with prednisone 40mg  daily x 1 week, taper as an outpatient, along with Augmentin. He will follow up with the patient in one week. Given that infection cleared on xray so quickly, question if this is inflammatory vs infection.  -patient able to ambulate today without oxygen and maintained oxygen saturations in the mid 90s- therefore patient does not qualify for home oxygen -patient also had a swallow eval- mild aspiration risk  COPD -Continue nebulizer treatments, steroids, inhalers -Incentive spirometry, flutter valve  Alcohol/marijuana use -Discussed cessation -was placed on continue CIWA protocol  Essential hypertension -Continue Cozaar  Consultants PCCM- pulmonology   Procedures Bronchoscopy  Discharge Exam: Vitals:  08/11/18 0542 08/11/18 0741  Then saw Pulm MD Wert this week 8/24   - 3 admits in time span of one year with aecopd last one 05/17/2018   - 05/25/2018  After extensive coaching inhaler device,  effectiveness =    75% with hfa/ 90% with dpi but ? coughing from dpi> try dulera 200 2bid  - alpha one screen 05/25/2018 >>>  MM level 141  - 08/28/2018  After extensive coaching inhaler device,  effectiveness =  75% . Continue dulera 200 2bid    Group D in terms of symptom/risk and laba/lama/ICS  therefore appropriate rx at this point >>>  Continue dulera 200 / incruse depending on availability thru [facility] where he is being followed by [name]   Shortness of Breath This is a chronic problem.  The current episode started more than 1 year ago. The problem occurs daily (qhs shortness of breath). The problem has been gradually worsening. Associated symptoms include chest pain, PND and wheezing. Pertinent negatives include no claudication, fever, headaches, hemoptysis, leg pain, leg swelling, orthopnea, rash, sore throat, sputum production, syncope or vomiting. The symptoms are aggravated by any activity and weather changes. Associated symptoms comments: Pain radiates to arm Legs go to sleep No dysphagia No gerd . Risk factors include smoking. He has tried beta agonist inhalers, oral steroids and steroid inhalers for the symptoms. The treatment provided moderate relief. His past medical history is significant for COPD and pneumonia. There is no history of asthma, CAD, DVT, a heart failure or PE.    Past Medical History:  Diagnosis Date  . Acute on chronic respiratory failure with hypoxia ([place]) 06/08/2013  . COPD (chronic obstructive pulmonary disease) ([place])    not on home  . Pneumonia 04/06/2016  . Pneumothorax    2016, fell from ladder  . Tick bite 06/03/2018     Family History  Problem Relation Age of Onset  . Heart disease Father   . COPD Sister      Social History   Socioeconomic History  . Marital status: Divorced    Spouse name: Not on file  . Number of children: Not on file  . Years of education: Not on file  . Highest education level: Not on file  Occupational History  . Occupation: Dealer  . Occupation: [employer]  . Financial resource strain: Not on file  . Food insecurity    Worry: Not on file    Inability: Not on file  . Transportation needs    Medical: Not on file    Non-medical: Not on file  Tobacco Use  . Smoking status: Former Smoker    Packs/day: 1.50    Years: 40.00    Pack years: 60.00    Types: Cigarettes    Quit date: 2014    Years since quitting: 6.6  . Smokeless tobacco: Current User    Types: Chew  Substance and Sexual  Activity  . Alcohol use: Yes    Alcohol/week: 28.0 standard drinks    Types: 28 Cans of beer per week    Comment: 4 beers a night; + jitters with not drinking, denies DTs or seizures  . Drug use: Yes    Types: Marijuana    Comment: daily; quit using crack and methamphetamine about 5 months ago   . Sexual activity: Not Currently    Partners: Female  Lifestyle  . Physical activity    Days per week: Not on file    Minutes per session: Not on file  . Stress: Not on file  Relationships  . Social Herbalist on phone: Not on file    Gets together: Not on file    Attends religious service: Not on file    Active member of club or organization: Not on file    Attends meetings of clubs or organizations: Not on file    Relationship status: Not on file  . Intimate partner violence    Fear of  current or ex partner: Not on file    Emotionally abused: Not on file    Physically abused: Not on file    Forced sexual activity: Not on file  Other Topics Concern  . Not on file  Social History Narrative   Lives alone near [place]     No Known Allergies   Outpatient Medications Prior to Visit  Medication Sig Dispense Refill  . albuterol (PROVENTIL HFA;VENTOLIN HFA) 108 (90 Base) MCG/ACT inhaler Inhale 1-2 puffs into the lungs every 6 (six) hours as needed for wheezing or shortness of breath. 1 Inhaler 5  . albuterol (PROVENTIL) (2.5 MG/3ML) 0.083% nebulizer solution Take 3 mLs (2.5 mg total) by nebulization every 6 (six) hours as needed for wheezing or shortness of breath. 150 mL 1  . amoxicillin-clavulanate (AUGMENTIN) 875-125 MG tablet Take 1 tablet by mouth 2 (two) times daily.    . calcium carbonate (TUMS - DOSED IN MG ELEMENTAL CALCIUM) 500 MG chewable tablet Chew 1 tablet by mouth daily as needed for indigestion or heartburn.    . folic acid (FOLVITE) 1 MG tablet Take 1 tablet (1 mg total) by mouth daily. (Patient not taking: Reported on 08/28/2018) 90 tablet 0  . guaiFENesin  (MUCINEX) 600 MG 12 hr tablet Take 1 tablet (600 mg total) by mouth 2 (two) times daily.    [name] losartan (COZAAR) 100 MG tablet Take 1 tablet (100 mg total) by mouth daily. 30 tablet 3  . mometasone-formoterol (DULERA) 200-5 MCG/ACT AERO Inhale 2 puffs into the lungs 2 (two) times daily. 13 g 2  . Potassium (POTASSIMIN PO) Take by mouth daily as needed.    . predniSONE (DELTASONE) 20 MG tablet Take 20 mg by mouth daily with breakfast.    . Respiratory Therapy Supplies (FLUTTER) DEVI Use 4 times daily 1 each 0  . Spacer/Aero-Holding Chambers (AEROCHAMBER MV) inhaler Use as instructed 1 each 0  . thiamine (VITAMIN B-1) 100 MG tablet Take 1 tablet (100 mg total) by mouth daily. 30 tablet 3  . umeclidinium bromide (INCRUSE ELLIPTA) 62.5 MCG/INH AEPB Inhale 1 puff into the lungs daily. 30 each 5   No facility-administered medications prior to visit.      Review of Systems  Constitutional: Negative for fever.  HENT: Negative for sinus pressure, sinus pain, sore throat and trouble swallowing.   Eyes: Negative.   Respiratory: Positive for shortness of breath and wheezing. Negative for hemoptysis and sputum production.   Cardiovascular: Positive for chest pain and PND. Negative for palpitations, orthopnea, claudication, leg swelling and syncope.  Gastrointestinal: Negative for nausea and vomiting.  Genitourinary: Negative.   Musculoskeletal: Negative.   Skin: Negative for rash.  Neurological: Negative for headaches.  Psychiatric/Behavioral: The patient is not nervous/anxious and is not hyperactive.        Objective:   Physical Exam There were no vitals filed for this visit. 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  ENT: No lesions,  mouth clear,  oropharynx clear, no  postnasal drip  Neck: No JVD, no TMG, no carotid bruits  Lungs: No use of accessory muscles, no dullness to percussion, distant breath sounds with improved breath sounds from the last office visit without wheezes  Cardiovascular: RRR, heart sounds normal, no murmur or gallops, no  peripheral edema  Abdomen: soft and NT, no HSM,  BS normal  Musculoskeletal: No deformities, no cyanosis or clubbing  Neuro: alert, non focal  Skin: Warm, no lesions or rashes  7/14 CXR IMPRESSION: Stable mild hazy opacity of right lung base and interval developed patchy opacity of medial left lung base suspicious for pneumonia.  BMP Latest Ref Rng & Units 08/11/2018 08/10/2018 08/09/2018  Glucose 70 - 99 mg/dL 195(H) 184(H) 111(H)  BUN 6 - 20 mg/dL 16 17 14   Creatinine 0.61 - 1.24 mg/dL 0.86 0.78 0.93  BUN/Creat Ratio 9 - 20 - - -  Sodium 135 - 145 mmol/L 138 137 143  Potassium 3.5 - 5.1 mmol/L 4.2 4.2 3.7  Chloride 98 - 111 mmol/L 102 99 103  CO2 22 - 32 mmol/L 27 26 27   Calcium 8.9 - 10.3 mg/dL 8.7(L) 8.7(L) 9.4   Hepatic Function Panel     Component Value Date/Time   PROT 6.0 06/20/2018 1159   ALBUMIN 4.1 06/20/2018 1159   AST 34 06/20/2018 1159   ALT 37 06/20/2018 1159   ALT 62 (H) 06/23/2016 1226   ALKPHOS 43 06/20/2018 1159   BILITOT 0.4 06/20/2018 1159   BILIDIR 0.12 04/16/2016 1601   IBILI NOT CALCULATED 03/26/2016 1845   CBC Latest Ref Rng & Units 08/11/2018 08/10/2018 08/09/2018  WBC 4.0 - 10.5 K/uL 15.9(H) 19.9(H) 25.7(H)  Hemoglobin 13.0 - 17.0 g/dL 13.4 13.5 13.9  Hematocrit 39.0 - 52.0 % 41.5 40.6 42.6  Platelets 150 - 400 K/uL 204 215 230        Assessment & Plan:  I personally reviewed all images and lab data in the [facility] system as well as any outside material available during this office visit and agree with the  radiology impressions.   No problem-specific Assessment & Plan notes found for this encounter.

## 2018-08-30 NOTE — Assessment & Plan Note (Signed)
Quit smoking 2015  - 3 admits in time span of one year with aecopd last one 05/17/2018   - 05/25/2018  After extensive coaching inhaler device,  effectiveness =    75% with hfa/ 90% with dpi but ? coughing from dpi> try dulera 200 2bid  - alpha one screen 05/25/2018 >>>  MM level 141  - 08/28/2018  After extensive coaching inhaler device,  effectiveness =    75% . Continue dulera 200 2bid    Group D in terms of symptom/risk and laba/lama/ICS  therefore appropriate rx at this point >>>  Continue dulera 200 / incruse depending on availability thru [facility] where he is being followed by [name]  F/u here can be prn - ideally needs pft when  COVID - 19 restrictions have been lifted.    Each maintenance medication was reviewed in detail including most importantly the difference between maintenance and as needed and under what circumstances the prns are to be used.  Please see AVS for specific  Instructions which are unique to this visit and I personally typed out  which were reviewed in detail in writing with the patient and a copy provided.

## 2018-08-31 ENCOUNTER — Telehealth: Payer: Self-pay

## 2018-08-31 ENCOUNTER — Ambulatory Visit: Payer: Self-pay | Admitting: Critical Care Medicine

## 2018-08-31 NOTE — Telephone Encounter (Signed)
Call placed to patient's sister, [name] # [phone] to inform her that HIM has a release of information that needs to be signed in order to obtain the certified documentation of patient demographics.  This document is with the other items that she is picking up for patient.  Message left with call back requested to this CM # [phone].

## 2018-08-31 NOTE — Telephone Encounter (Signed)
Call received from patient's sister, [name], explained to her that when she picks up the aerochamber for her brother, there is a document to sign for release of medical information.  Her brother will need to sign and it can be sent to medical records.  She was not sure that he will be with her today so she may have to have him sign and then return it to the clinic

## 2018-08-31 NOTE — Telephone Encounter (Signed)
Met with patient's sister, [name], when she came to the clinic today to pick up the aerochamber for patient.  She said that she knows how to use it and is able to instruct him.  Explained to her that he will need to sign the release of information and it will need to be faxed/emailed to HIM. She also has the application for social security replacement card.

## 2018-09-05 ENCOUNTER — Telehealth: Payer: Self-pay

## 2018-09-05 NOTE — Telephone Encounter (Signed)
Patient's sister, [name], returned the signed ROI for HIM.  The document was axed to HIM requests - attn: Berton Mount

## 2018-09-11 ENCOUNTER — Encounter (HOSPITAL_COMMUNITY): Payer: Self-pay

## 2018-09-11 ENCOUNTER — Inpatient Hospital Stay (HOSPITAL_COMMUNITY)
Admission: EM | Admit: 2018-09-11 | Discharge: 2018-09-13 | DRG: 871 | Disposition: A | Discharge status: Home / Self Care | Payer: Medicaid Other | Attending: Internal Medicine | Admitting: Internal Medicine

## 2018-09-11 ENCOUNTER — Other Ambulatory Visit: Payer: Self-pay

## 2018-09-11 ENCOUNTER — Emergency Department (HOSPITAL_COMMUNITY): Payer: Medicaid Other

## 2018-09-11 DIAGNOSIS — Z825 Family history of asthma and other chronic lower respiratory diseases: Secondary | ICD-10-CM

## 2018-09-11 DIAGNOSIS — Z20828 Contact with and (suspected) exposure to other viral communicable diseases: Secondary | ICD-10-CM | POA: Diagnosis present

## 2018-09-11 DIAGNOSIS — I10 Essential (primary) hypertension: Secondary | ICD-10-CM | POA: Diagnosis present

## 2018-09-11 DIAGNOSIS — T380X5A Adverse effect of glucocorticoids and synthetic analogues, initial encounter: Secondary | ICD-10-CM | POA: Diagnosis present

## 2018-09-11 DIAGNOSIS — J440 Chronic obstructive pulmonary disease with acute lower respiratory infection: Secondary | ICD-10-CM | POA: Diagnosis present

## 2018-09-11 DIAGNOSIS — F17200 Nicotine dependence, unspecified, uncomplicated: Secondary | ICD-10-CM

## 2018-09-11 DIAGNOSIS — J189 Pneumonia, unspecified organism: Secondary | ICD-10-CM | POA: Diagnosis present

## 2018-09-11 DIAGNOSIS — F17220 Nicotine dependence, chewing tobacco, uncomplicated: Secondary | ICD-10-CM | POA: Diagnosis present

## 2018-09-11 DIAGNOSIS — Z79899 Other long term (current) drug therapy: Secondary | ICD-10-CM | POA: Diagnosis not present

## 2018-09-11 DIAGNOSIS — F1210 Cannabis abuse, uncomplicated: Secondary | ICD-10-CM | POA: Diagnosis present

## 2018-09-11 DIAGNOSIS — J441 Chronic obstructive pulmonary disease with (acute) exacerbation: Secondary | ICD-10-CM | POA: Diagnosis present

## 2018-09-11 DIAGNOSIS — F1290 Cannabis use, unspecified, uncomplicated: Secondary | ICD-10-CM

## 2018-09-11 DIAGNOSIS — Z8249 Family history of ischemic heart disease and other diseases of the circulatory system: Secondary | ICD-10-CM | POA: Diagnosis not present

## 2018-09-11 DIAGNOSIS — R000 Tachycardia, unspecified: Secondary | ICD-10-CM | POA: Diagnosis not present

## 2018-09-11 DIAGNOSIS — D899 Disorder involving the immune mechanism, unspecified: Secondary | ICD-10-CM | POA: Diagnosis present

## 2018-09-11 DIAGNOSIS — F1010 Alcohol abuse, uncomplicated: Secondary | ICD-10-CM | POA: Diagnosis present

## 2018-09-11 DIAGNOSIS — R251 Tremor, unspecified: Secondary | ICD-10-CM | POA: Diagnosis present

## 2018-09-11 DIAGNOSIS — Z7952 Long term (current) use of systemic steroids: Secondary | ICD-10-CM

## 2018-09-11 DIAGNOSIS — Z7951 Long term (current) use of inhaled steroids: Secondary | ICD-10-CM | POA: Diagnosis not present

## 2018-09-11 DIAGNOSIS — R0902 Hypoxemia: Secondary | ICD-10-CM | POA: Diagnosis not present

## 2018-09-11 DIAGNOSIS — R0602 Shortness of breath: Secondary | ICD-10-CM | POA: Diagnosis not present

## 2018-09-11 DIAGNOSIS — A419 Sepsis, unspecified organism: Principal | ICD-10-CM

## 2018-09-11 DIAGNOSIS — R0682 Tachypnea, not elsewhere classified: Secondary | ICD-10-CM

## 2018-09-11 DIAGNOSIS — R531 Weakness: Secondary | ICD-10-CM

## 2018-09-11 DIAGNOSIS — F1020 Alcohol dependence, uncomplicated: Secondary | ICD-10-CM

## 2018-09-11 DIAGNOSIS — J181 Lobar pneumonia, unspecified organism: Secondary | ICD-10-CM

## 2018-09-11 HISTORY — DX: Essential (primary) hypertension: I10

## 2018-09-11 HISTORY — DX: Dyspnea, unspecified: R06.00

## 2018-09-11 LAB — CBC WITH DIFFERENTIAL/PLATELET
Abs Immature Granulocytes: 0.15 10*3/uL — ABNORMAL HIGH (ref 0.00–0.07)
Basophils Absolute: 0 10*3/uL (ref 0.0–0.1)
Basophils Relative: 0 %
Eosinophils Absolute: 0.1 10*3/uL (ref 0.0–0.5)
Eosinophils Relative: 0 %
HCT: 44.7 % (ref 39.0–52.0)
Hemoglobin: 14.7 g/dL (ref 13.0–17.0)
Immature Granulocytes: 1 %
Lymphocytes Relative: 6 %
Lymphs Abs: 1.7 10*3/uL (ref 0.7–4.0)
MCH: 32.6 pg (ref 26.0–34.0)
MCHC: 32.9 g/dL (ref 30.0–36.0)
MCV: 99.1 fL (ref 80.0–100.0)
Monocytes Absolute: 1.3 10*3/uL — ABNORMAL HIGH (ref 0.1–1.0)
Monocytes Relative: 5 %
Neutro Abs: 22.9 10*3/uL — ABNORMAL HIGH (ref 1.7–7.7)
Neutrophils Relative %: 88 %
Platelets: 239 10*3/uL (ref 150–400)
RBC: 4.51 MIL/uL (ref 4.22–5.81)
RDW: 12.9 % (ref 11.5–15.5)
WBC: 26.1 10*3/uL — ABNORMAL HIGH (ref 4.0–10.5)
nRBC: 0 % (ref 0.0–0.2)

## 2018-09-11 LAB — BASIC METABOLIC PANEL
Anion gap: 10 (ref 5–15)
BUN: 18 mg/dL (ref 6–20)
CO2: 27 mmol/L (ref 22–32)
Calcium: 9 mg/dL (ref 8.9–10.3)
Chloride: 103 mmol/L (ref 98–111)
Creatinine, Ser: 0.87 mg/dL (ref 0.61–1.24)
GFR calc Af Amer: >60 mL/min (ref >60)
GFR calc non Af Amer: >60 mL/min (ref >60)
Glucose, Bld: 139 mg/dL — ABNORMAL HIGH (ref 70–99)
Potassium: 4.3 mmol/L (ref 3.5–5.1)
Sodium: 140 mmol/L (ref 135–145)

## 2018-09-11 LAB — LACTIC ACID, PLASMA
Lactic Acid, Venous: 1.7 mmol/L (ref 0.5–1.9)
Lactic Acid, Venous: 1.4 mmol/L (ref 0.5–1.9)

## 2018-09-11 LAB — TROPONIN I (HIGH SENSITIVITY)
Troponin I (High Sensitivity): 17 ng/L (ref <18)
Troponin I (High Sensitivity): 14 ng/L (ref <18)

## 2018-09-11 LAB — PROTIME-INR
INR: 1 (ref 0.8–1.2)
Prothrombin Time: 12.8 seconds (ref 11.4–15.2)

## 2018-09-11 LAB — MRSA PCR SCREENING: MRSA by PCR: NEGATIVE

## 2018-09-11 LAB — SARS CORONAVIRUS 2 BY RT PCR (HOSPITAL ORDER, PERFORMED IN ~~LOC~~ HOSPITAL LAB): SARS Coronavirus 2: NEGATIVE

## 2018-09-11 LAB — BRAIN NATRIURETIC PEPTIDE: B Natriuretic Peptide: 30.1 pg/mL (ref 0.0–100.0)

## 2018-09-11 LAB — APTT: aPTT: 26 seconds (ref 24–36)

## 2018-09-11 LAB — PROCALCITONIN: Procalcitonin: 0.29 ng/mL

## 2018-09-11 MED ORDER — IPRATROPIUM-ALBUTEROL 0.5-2.5 (3) MG/3ML IN SOLN: 3.0000 mL | RESPIRATORY_TRACT | Status: DC | PRN

## 2018-09-11 MED ORDER — BUDESONIDE 0.5 MG/2ML IN SUSP: 0.5000 mg | Freq: Four times a day (QID) | RESPIRATORY_TRACT | Status: DC

## 2018-09-11 MED ORDER — ADULT MULTIVITAMIN W/MINERALS CH
1.0000 | ORAL_TABLET | Freq: Every day | ORAL | Status: DC
  Administered 2018-09-11 – 2018-09-13 (×3): 1 via ORAL
  Filled 2018-09-11 (×3): qty 1

## 2018-09-11 MED ORDER — VANCOMYCIN HCL 10 G IV SOLR
1250.0000 mg | Freq: Two times a day (BID) | INTRAVENOUS | Status: DC
  Administered 2018-09-11 – 2018-09-12 (×2): 1250 mg via INTRAVENOUS
  Filled 2018-09-11 (×2): qty 1250

## 2018-09-11 MED ORDER — THIAMINE HCL 100 MG/ML IJ SOLN: 100.0000 mg | Freq: Every day | INTRAMUSCULAR | Status: DC

## 2018-09-11 MED ORDER — METRONIDAZOLE IN NACL 5-0.79 MG/ML-% IV SOLN
500.0000 mg | Freq: Three times a day (TID) | INTRAVENOUS | Status: DC
  Administered 2018-09-11 – 2018-09-12 (×3): 500 mg via INTRAVENOUS
  Filled 2018-09-11 (×3): qty 100

## 2018-09-11 MED ORDER — PREDNISONE 20 MG PO TABS: 40.0000 mg | ORAL_TABLET | Freq: Every day | ORAL | Status: DC

## 2018-09-11 MED ORDER — UMECLIDINIUM BROMIDE 62.5 MCG/INH IN AEPB
1.0000 | INHALATION_SPRAY | Freq: Every day | RESPIRATORY_TRACT | Status: DC
  Administered 2018-09-12 – 2018-09-13 (×2): 1 via RESPIRATORY_TRACT
  Filled 2018-09-11: qty 7

## 2018-09-11 MED ORDER — VANCOMYCIN HCL IN DEXTROSE 1-5 GM/200ML-% IV SOLN: 1000.0000 mg | Freq: Once | INTRAVENOUS | Status: DC

## 2018-09-11 MED ORDER — SODIUM CHLORIDE 0.9 % IV SOLN
2.0000 g | Freq: Three times a day (TID) | INTRAVENOUS | Status: DC
  Administered 2018-09-11 – 2018-09-12 (×3): 2 g via INTRAVENOUS
  Filled 2018-09-11 (×4): qty 2

## 2018-09-11 MED ORDER — METHYLPREDNISOLONE SODIUM SUCC 125 MG IJ SOLR: 60.0000 mg | Freq: Two times a day (BID) | INTRAMUSCULAR | Status: DC

## 2018-09-11 MED ORDER — VITAMIN B-1 100 MG PO TABS
100.0000 mg | ORAL_TABLET | Freq: Every day | ORAL | Status: DC
  Administered 2018-09-12 – 2018-09-13 (×2): 100 mg via ORAL
  Filled 2018-09-11 (×3): qty 1

## 2018-09-11 MED ORDER — FOLIC ACID 1 MG PO TABS
1.0000 mg | ORAL_TABLET | Freq: Every day | ORAL | Status: DC
  Administered 2018-09-11 – 2018-09-13 (×3): 1 mg via ORAL
  Filled 2018-09-11 (×3): qty 1

## 2018-09-11 MED ORDER — SODIUM CHLORIDE 0.9 % IV SOLN
500.0000 mg | INTRAVENOUS | Status: DC
  Administered 2018-09-11 – 2018-09-12 (×2): 500 mg via INTRAVENOUS
  Filled 2018-09-11 (×3): qty 500

## 2018-09-11 MED ORDER — LORAZEPAM 2 MG/ML IJ SOLN: 0.0000 mg | Freq: Two times a day (BID) | INTRAMUSCULAR | Status: DC

## 2018-09-11 MED ORDER — ENOXAPARIN SODIUM 40 MG/0.4ML ~~LOC~~ SOLN
40.0000 mg | SUBCUTANEOUS | Status: DC
  Administered 2018-09-11 – 2018-09-12 (×2): 40 mg via SUBCUTANEOUS
  Filled 2018-09-11 (×2): qty 0.4

## 2018-09-11 MED ORDER — IPRATROPIUM BROMIDE HFA 17 MCG/ACT IN AERS
2.0000 | INHALATION_SPRAY | RESPIRATORY_TRACT | Status: DC | PRN
  Filled 2018-09-11: qty 12.9

## 2018-09-11 MED ORDER — VANCOMYCIN HCL 10 G IV SOLR
1750.0000 mg | INTRAVENOUS | Status: AC
  Administered 2018-09-11: 1750 mg via INTRAVENOUS
  Filled 2018-09-11: qty 1750

## 2018-09-11 MED ORDER — SODIUM CHLORIDE 0.9 % IV SOLN
2.0000 g | Freq: Once | INTRAVENOUS | Status: AC
  Administered 2018-09-11: 12:00:00 2 g via INTRAVENOUS
  Filled 2018-09-11: qty 2

## 2018-09-11 MED ORDER — ALBUTEROL SULFATE HFA 108 (90 BASE) MCG/ACT IN AERS
2.0000 | INHALATION_SPRAY | RESPIRATORY_TRACT | Status: DC | PRN
  Administered 2018-09-11: 11:00:00 2 via RESPIRATORY_TRACT
  Filled 2018-09-11: qty 6.7

## 2018-09-11 MED ORDER — METHYLPREDNISOLONE SODIUM SUCC 125 MG IJ SOLR
60.0000 mg | Freq: Two times a day (BID) | INTRAMUSCULAR | Status: DC
  Administered 2018-09-11 – 2018-09-13 (×4): 60 mg via INTRAVENOUS
  Filled 2018-09-11 (×4): qty 2

## 2018-09-11 MED ORDER — LORAZEPAM 2 MG/ML IJ SOLN
0.0000 mg | Freq: Four times a day (QID) | INTRAMUSCULAR | Status: DC
  Administered 2018-09-11 – 2018-09-12 (×2): 2 mg via INTRAVENOUS
  Filled 2018-09-11: qty 1

## 2018-09-11 MED ORDER — LORAZEPAM 2 MG/ML IJ SOLN
1.0000 mg | Freq: Four times a day (QID) | INTRAMUSCULAR | Status: DC | PRN
  Filled 2018-09-11: qty 1

## 2018-09-11 MED ORDER — LORAZEPAM 1 MG PO TABS: 1.0000 mg | ORAL_TABLET | Freq: Four times a day (QID) | ORAL | Status: DC | PRN

## 2018-09-11 MED ORDER — SODIUM CHLORIDE 0.9 % IV BOLUS
30.0000 mL/kg | Freq: Once | INTRAVENOUS | Status: AC
  Administered 2018-09-11: 2559 mL via INTRAVENOUS

## 2018-09-11 MED ORDER — BUDESONIDE 0.5 MG/2ML IN SUSP
0.5000 mg | Freq: Two times a day (BID) | RESPIRATORY_TRACT | Status: DC
  Administered 2018-09-12 – 2018-09-13 (×3): 0.5 mg via RESPIRATORY_TRACT
  Filled 2018-09-11 (×4): qty 2

## 2018-09-11 NOTE — ED Notes (Signed)
Urinal at bedside. Patient aware we need urine sample.

## 2018-09-11 NOTE — ED Triage Notes (Signed)
Patient arrived via [facility] from home.  Diagnosed with pneumonia 2 months ago.  EMS called out for pt c/o shob and generalized weakness.   Patient reports tightness in his chest.  Coughing up brown phlegm   Initial RA 02 89% Non rebreather 98%   Hx. COPD  A/Ox4 Ambulatory with ems   18g in left AC.   BP-114/82 HR-140 sinus tach Temp-98.2 oral 98% non breather

## 2018-09-11 NOTE — Progress Notes (Addendum)
MRSA swab sent. Patient continually falls asleep after being asked a question while doing his admission.

## 2018-09-11 NOTE — ED Notes (Signed)
92% RA. Patient placed on [place] 2 litters at 96%.

## 2018-09-11 NOTE — Progress Notes (Signed)
Arrived to Pt room for breathing treatment.  Pt receiver phone call while I was putting nebulizer together.  After phone call Pt became belligerent throwing things and hollering that he wanted something to eat and asking if "cone was trying to starve him."  I told him I was only here for his breathing treatment but I would be happy to let his nurse know that he was hungry and hadn't had anything to eat.  Offered breathing treatment again but Pt said "hell no I don't want it, I don't need food then I don't need that either."  RN notified about Pt being hungry.

## 2018-09-11 NOTE — ED Notes (Signed)
Attempted to give report to nurse for [place]. Nurse unavailable at this time. Will call back in 5 min.

## 2018-09-11 NOTE — H&P (Addendum)
History and Physical    James [name] YIR:[id number] DOB: 1960-08-02 DOA: 09/11/2018  PCP: Scot Jun, FNP Patient coming from: Home.  Independently ambulates at baseline.  Chief Complaint: Shortness of breath  HPI: James [name] is a 58 y.o. male with history of COPD on prednisone, HTN, alcohol use disorder, marijuana use, former tobacco user and recurrent hospitalization for pneumonia and COPD exacerbation presenting with acute onset of dyspnea about 5:30 AM this morning.  Patient was brought to ED by EMS.  Patient states that he was in his usual state of health up until 5:30 AM this morning when he suddenly woke up with shortness of breath and called EMS.  No aggravating or elevating factor.  Still continues to feel short of breath.  Also reports weakness.  Has some associated productive cough with brownish phlegm but no hemoptysis.  He denies runny nose, sore throat, chest pain, orthopnea, PND, leg swelling or pain, recent travel, nausea, vomiting, abdominal pain, diarrhea, constipation, melena, hematochezia, UTI symptoms, focal weakness, numbness or tingling.  He reports intermittent choking when he swallows.  Denies known exposure to COVID-19.   Not on oxygen at home.  Reports good compliance with his medication.   Reportedly saturating at 89% on room air when EMS arrived.  Improved to 98% on NRB.  Lives alone.  Independently ambulates at baseline.  Quit smoking 5 years ago but chews tobacco now.  He says he had 2 of the 24 ounce bottle beers last night.  Admits to marijuana use.  He is followed by Dr. Christinia [name] at [facility] pulmonology.  In ED, initial vital signs within normal range.  Done HR increased to 120s.  RR in upper 20s.  BP within normal range.  Saturation in 90s on 2 L by [place].  Glucose 139.  WBC 26 with bandemia.  Otherwise, BMP and CBC not impressive.  BNP 30.1.  High-sensitivity troponin XVII.  Lactic acid 1.7.  Procalcitonin 0.29.  PT/INR within normal range.  COVID-19  negative.  Portable CXR revealed new asymmetric opacity in the right lung base concerning for either pneumonia or aspiration.  He was started on Vanc and cefepime given recent hospitalization for pneumonia and hospitalist service was called for admission out of concern for sepsis given tachycardia and tachypnea.  ROS All review of system negative except for pertinent positives and negatives as history of present illness above.  PMH Past Medical History:  Diagnosis Date  . Acute on chronic respiratory failure with hypoxia ([place]) 06/08/2013  . COPD (chronic obstructive pulmonary disease) ([place])    not on home  . Pneumonia 04/06/2016  . Pneumothorax    2016, fell from ladder  . Tick bite 06/03/2018   PSH Past Surgical History:  Procedure Laterality Date  . VIDEO ASSISTED THORACOSCOPY (VATS)/THOROCOTOMY Left 06/11/2013   Procedure: VIDEO ASSISTED THORACOSCOPY (VATS)/THOROCOTOMY;  Surgeon: Ivin Poot, MD;  Location: [place];  Service: Thoracic;  Laterality: Left;  [name] VIDEO BRONCHOSCOPY N/A 06/11/2013   Procedure: VIDEO BRONCHOSCOPY;  Surgeon: Ivin Poot, MD;  Location: [place];  Service: Thoracic;  Laterality: N/A;  . VIDEO BRONCHOSCOPY N/A 08/10/2018   Procedure: VIDEO BRONCHOSCOPY WITH FLUORO;  Surgeon: Candee Furbish, MD;  Location: [facility] ENDOSCOPY;  Service: Endoscopy;  Laterality: N/A;   Fam HX Family History  Problem Relation Age of Onset  . Heart disease Father   . COPD Sister     Social Hx  reports that he quit smoking about 6 years ago. His smoking  use included cigarettes. He has a 60.00 pack-year smoking history. His smokeless tobacco use includes chew. He reports current alcohol use of about 28.0 standard drinks of alcohol per week. He reports current drug use. Drug: Marijuana.  Allergy No Known Allergies Home Meds Prior to Admission medications   Medication Sig Start Date End Date Taking? Authorizing Provider  albuterol (PROVENTIL HFA;VENTOLIN HFA) 108 (90 Base) MCG/ACT inhaler  Inhale 1-2 puffs into the lungs every 6 (six) hours as needed for wheezing or shortness of breath. 03/29/18   Scot Jun, FNP  albuterol (PROVENTIL) (2.5 MG/3ML) 0.083% nebulizer solution Take 3 mLs (2.5 mg total) by nebulization every 6 (six) hours as needed for wheezing or shortness of breath. 06/20/18   Scot Jun, FNP  amoxicillin-clavulanate (AUGMENTIN) 875-125 MG tablet Take 1 tablet by mouth 2 (two) times daily.    [provider]  calcium carbonate (TUMS - DOSED IN MG ELEMENTAL CALCIUM) 500 MG chewable tablet Chew 1 tablet by mouth daily as needed for indigestion or heartburn.    [provider]  folic acid (FOLVITE) 1 MG tablet Take 1 tablet (1 mg total) by mouth daily. Patient not taking: Reported on 08/28/2018 07/18/18   Scot Jun, FNP  guaiFENesin (MUCINEX) 600 MG 12 hr tablet Take 1 tablet (600 mg total) by mouth 2 (two) times daily. 08/11/18   Mikhail, Velta Addison, DO  losartan (COZAAR) 100 MG tablet Take 1 tablet (100 mg total) by mouth daily. 04/27/18   [name], MD  mometasone-formoterol (DULERA) 200-5 MCG/ACT AERO Inhale 2 puffs into the lungs 2 (two) times daily. 07/18/18   Scot Jun, FNP  Potassium (POTASSIMIN PO) Take by mouth daily as needed.    [provider]  predniSONE (DELTASONE) 20 MG tablet Take 20 mg by mouth daily with breakfast.    [provider]  Respiratory Therapy Supplies (FLUTTER) DEVI Use 4 times daily 04/27/18   [name], MD  Spacer/Aero-Holding Chambers (AEROCHAMBER MV) inhaler Use as instructed 08/03/18   [name], MD  thiamine (VITAMIN B-1) 100 MG tablet Take 1 tablet (100 mg total) by mouth daily. 08/03/18   [name], MD  umeclidinium bromide (INCRUSE ELLIPTA) 62.5 MCG/INH AEPB Inhale 1 puff into the lungs daily. 07/25/18   [name], MD    Physical Exam: Vitals:   09/11/18 1245 09/11/18 1300 09/11/18 1316 09/11/18 1319  BP:   127/82   Pulse: (!) 103 (!)  106 (!) 105 (!) 102  Resp: (!) 26 (!) 29 (!) 27 (!) 28  Temp:      TempSrc:      SpO2: 95% 97% 95% 95%  Weight:      Height:        GENERAL: No acute distress.  Appears well.  HEENT: MMM.  Vision and hearing grossly intact.  NECK: Supple.  No apparent JVD.  RESP:  No IWOB.  Fair air movement bilaterally.  Crackles over RLL. CVS:  RRR. Heart sounds normal.  ABD/GI/GU: Bowel sounds present. Soft. Non tender.  MSK/EXT:  Moves extremities. No apparent deformity or edema.  SKIN: no apparent skin lesion or wound NEURO: Awake, alert and oriented appropriately.  No gross deficit.  Mild tremors in his hands. PSYCH: Calm. Normal affect.   Personally Reviewed Radiological Exams Dg Chest Port 1 View  Result Date: 09/11/2018 CLINICAL DATA:  Shortness of breath. EXAM: PORTABLE CHEST 1 VIEW COMPARISON:  08/10/2018 FINDINGS: Normal heart size. No pleural effusion or edema  identified. New asymmetric airspace consolidation in the right lung base. Left lung clear. IMPRESSION: 1. Asymmetric opacity in the right lung base is new from previous exam concerning for either pneumonia or aspiration. Electronically Signed   By: Kerby Moors M.D.   On: 09/11/2018 11:21     Personally Reviewed Labs: CBC: Recent Labs  Lab 09/11/18 1100  WBC 26.1*  NEUTROABS 22.9*  HGB 14.7  HCT 44.7  MCV 99.1  PLT 295   Basic Metabolic Panel: Recent Labs  Lab 09/11/18 1100  NA 140  K 4.3  CL 103  CO2 27  GLUCOSE 139*  BUN 18  CREATININE 0.87  CALCIUM 9.0   GFR: Estimated Creatinine Clearance: 95.3 mL/min (by C-G formula based on SCr of 0.87 mg/dL). Liver Function Tests: No results for input(s): AST, ALT, ALKPHOS, BILITOT, PROT, ALBUMIN in the last 168 hours. No results for input(s): LIPASE, AMYLASE in the last 168 hours. No results for input(s): AMMONIA in the last 168 hours. Coagulation Profile: Recent Labs  Lab 09/11/18 1100  INR 1.0   Cardiac Enzymes: No results for input(s): CKTOTAL, CKMB,  CKMBINDEX, TROPONINI in the last 168 hours. BNP (last 3 results) No results for input(s): PROBNP in the last 8760 hours. HbA1C: No results for input(s): HGBA1C in the last 72 hours. CBG: No results for input(s): GLUCAP in the last 168 hours. Lipid Profile: No results for input(s): CHOL, HDL, LDLCALC, TRIG, CHOLHDL, LDLDIRECT in the last 72 hours. Thyroid Function Tests: No results for input(s): TSH, T4TOTAL, FREET4, T3FREE, THYROIDAB in the last 72 hours. Anemia Panel: No results for input(s): VITAMINB12, FOLATE, FERRITIN, TIBC, IRON, RETICCTPCT in the last 72 hours. Urine analysis:    Component Value Date/Time   COLORURINE YELLOW 04/06/2016 1307   APPEARANCEUR CLEAR 04/06/2016 1307   LABSPEC 1.012 04/06/2016 1307   PHURINE 5.0 04/06/2016 1307   GLUCOSEU >=500 (A) 04/06/2016 1307   HGBUR NEGATIVE 04/06/2016 1307   BILIRUBINUR NEGATIVE 04/06/2016 1307   KETONESUR NEGATIVE 04/06/2016 1307   PROTEINUR NEGATIVE 04/06/2016 1307   UROBILINOGEN 4.0 (H) 06/15/2013 1030   NITRITE NEGATIVE 04/06/2016 1307   LEUKOCYTESUR NEGATIVE 04/06/2016 1307    Sepsis Labs:  Lactic acid within normal limits. Procalcitonin 0.29.  Personally Reviewed EKG:  EKG features sinus tachycardia without acute ischemic finding.  Assessment/Plan Sepsis:Has tachycardia, tachypnea and leukocytosis with RLL pneumonia.  Procalcitonin elevated. -Received cefepime and vancomycin in ED-we will continue -Add Flagyl given concern for aspiration, and azithromycin for atypical coverage.  Right lower lobe pneumonia: concern for aspiration pneumonia given intermittent choking and alcohol use. Has leukocytosis which could be due to chronic steroid as well.  COVID-19 negative.  Procalcitonin elevated. -Continue broad-spectrum antibiotics with vancomycin, cefepime, Flagyl and azithromycin -Follow blood and sputum cultures, urine strep pneumo and Legionella -COPD management as below. -Swallow eval and SLP  COPD  exacerbation: has cardinal symptoms including dyspnea and productive cough.  On steroid chronically.  Not on oxygen at home. -COPD pathway with systemic steroid, LABA/LAMA/ICS and PRN DuoNeb  -Mucolytic's -Wean oxygen as able-not on oxygen at home.  Leukocytosis with bandemia: Could be due to infection and chronic steroid. -Continue trending  Hypertension: Normotensive -Continue home medications.  Generalized weakness -PT/OT eval  Alcohol use disorder: had 48 ounce of beer last night.  Some tremors on exam. -CIWA protocol with Ativan -Vitamins  Tobacco use disorder: He chews tobacco. -Encourage cessation  Marijuana use disorder -Encourage cessation.  DVT prophylaxis: Subcu Lovenox Code Status: Full code Family Communication: None.  Patient lives alone.  Disposition Plan: Admit to MedSurg Consults called: None Admission status: Inpatient  Severity of Illness: The appropriate patient status for this patient is INPATIENT. Inpatient status is judged to be reasonable and necessary in order to provide the required intensity of service to ensure the patient's safety. The patient's presenting symptoms, physical exam findings, and initial radiographic and laboratory data in the context of their chronic comorbidities is felt to place them at high risk for further clinical deterioration. Furthermore, it is not anticipated that the patient will be medically stable for discharge from the hospital within 2 midnights of admission. The following factors support the patient status of inpatient.    "           The patient's presenting symptoms include acute shortness of breath, cough and generalized weakness "           The worrisome physical exam findings include tachycardia, tachypnea, tremors and weakness "           The initial radiographic and laboratory data are worrisome because significant leukocytosis, infiltrates on chest x-ray and elevated procalcitonin "           The chronic  co-morbidities include COPD on chronic steroid, immunocompromised status due to chronic steroid, hypertension, alcohol use and tobacco use     I certify that at the point of admission it is my clinical judgment that the patient will require inpatient hospital care spanning beyond 2 midnights from the point of admission due to high intensity of service, high risk for further deterioration and high frequency of surveillance required.   Mercy Riding MD [facility]  If 7PM-7AM, please contact night-coverage [email or web address] Password [email or web address]  09/11/2018, 1:27 PM

## 2018-09-11 NOTE — ED Notes (Signed)
ED TO INPATIENT HANDOFF REPORT  ED Nurse Name and Phone #: [phone]  S Name/Age/Gender James [name] 58 y.o. male Room/Bed: WA21/WA21  Code Status   Code Status: Prior  Home/SNF/Other Home Patient oriented to: self, place, time and situation Is this baseline? Yes   Triage Complete: Triage complete  Chief Complaint shob  Triage Note Patient arrived via [facility] from home.  Diagnosed with pneumonia 2 months ago.  EMS called out for pt c/o shob and generalized weakness.   Patient reports tightness in his chest.  Coughing up brown phlegm   Initial RA 02 89% Non rebreather 98%   Hx. COPD  A/Ox4 Ambulatory with ems   18g in left AC.   BP-114/82 HR-140 sinus tach Temp-98.2 oral 98% non breather     Allergies No Known Allergies  Level of Care/Admitting Diagnosis ED Disposition    ED Disposition Condition Comment   Admit  Hospital Area: [place] [[id number]]  Level of Care: Med-Surg [16]  Covid Evaluation: Asymptomatic Screening Protocol (No Symptoms)  Diagnosis: Pneumonia [[id number]]  Admitting Physician: Mercy Riding [[id number]]  Attending Physician: Mercy Riding [[id number]]  Estimated length of stay: past midnight tomorrow  Certification:: I certify this patient will need inpatient services for at least 2 midnights  PT Class (Do Not Modify): Inpatient [101]  PT Acc Code (Do Not Modify): Private [1]       B Medical/Surgery History Past Medical History:  Diagnosis Date  . Acute on chronic respiratory failure with hypoxia ([place]) 06/08/2013  . COPD (chronic obstructive pulmonary disease) ([place])    not on home  . Dyspnea   . Hypertension   . Pneumonia 04/06/2016  . Pneumothorax    2016, fell from ladder  . Tick bite 06/03/2018   Past Surgical History:  Procedure Laterality Date  . VIDEO ASSISTED THORACOSCOPY (VATS)/THOROCOTOMY Left 06/11/2013   Procedure: VIDEO ASSISTED THORACOSCOPY (VATS)/THOROCOTOMY;  Surgeon: Ivin Poot, MD;  Location:  [place];  Service: Thoracic;  Laterality: Left;  [name] VIDEO BRONCHOSCOPY N/A 06/11/2013   Procedure: VIDEO BRONCHOSCOPY;  Surgeon: Ivin Poot, MD;  Location: [place];  Service: Thoracic;  Laterality: N/A;  . VIDEO BRONCHOSCOPY N/A 08/10/2018   Procedure: VIDEO BRONCHOSCOPY WITH FLUORO;  Surgeon: Candee Furbish, MD;  Location: [facility] ENDOSCOPY;  Service: Endoscopy;  Laterality: N/A;     A IV Location/Drains/Wounds Patient Lines/Drains/Airways Status   Active Line/Drains/Airways    Name:   Placement date:   Placement time:   Site:   Days:   Peripheral IV 08/09/18 Left;Anterior Forearm   08/09/18    2320    Forearm   33   Peripheral IV 09/11/18 Left Antecubital   09/11/18    1038    Antecubital   less than 1          Intake/Output Last 24 hours  Intake/Output Summary (Last 24 hours) at 09/11/2018 1603 Last data filed at 09/11/2018 1554 Gross per 24 hour  Intake 3159.14 ml  Output -  Net 3159.14 ml    Labs/Imaging Results for orders placed or performed during the hospital encounter of 09/11/18 (from the past 48 hour(s))  SARS Coronavirus 2 [facility] order, Performed in [facility] hospital lab) Nasopharyngeal Nasopharyngeal Swab     Status: None   Collection Time: 09/11/18 10:49 AM   Specimen: Nasopharyngeal Swab  Result Value Ref Range   SARS Coronavirus 2 NEGATIVE NEGATIVE    Comment: (NOTE) If result is NEGATIVE SARS-CoV-2 target nucleic acids are  NOT DETECTED. The SARS-CoV-2 RNA is generally detectable in upper and lower  respiratory specimens during the acute phase of infection. The lowest  concentration of SARS-CoV-2 viral copies this assay can detect is 250  copies / mL. A negative result does not preclude SARS-CoV-2 infection  and should not be used as the sole basis for treatment or other  patient management decisions.  A negative result may occur with  improper specimen collection / handling, submission of specimen other  than nasopharyngeal swab, presence of viral mutation(s)  within the  areas targeted by this assay, and inadequate number of viral copies  (<250 copies / mL). A negative result must be combined with clinical  observations, patient history, and epidemiological information. If result is POSITIVE SARS-CoV-2 target nucleic acids are DETECTED. The SARS-CoV-2 RNA is generally detectable in upper and lower  respiratory specimens dur ing the acute phase of infection.  Positive  results are indicative of active infection with SARS-CoV-2.  Clinical  correlation with patient history and other diagnostic information is  necessary to determine patient infection status.  Positive results do  not rule out bacterial infection or co-infection with other viruses. If result is PRESUMPTIVE POSTIVE SARS-CoV-2 nucleic acids MAY BE PRESENT.   A presumptive positive result was obtained on the submitted specimen  and confirmed on repeat testing.  While 2019 novel coronavirus  (SARS-CoV-2) nucleic acids may be present in the submitted sample  additional confirmatory testing may be necessary for epidemiological  and / or clinical management purposes  to differentiate between  SARS-CoV-2 and other Sarbecovirus currently known to infect humans.  If clinically indicated additional testing with an alternate test  methodology [phone]) is advised. The SARS-CoV-2 RNA is generally  detectable in upper and lower respiratory sp ecimens during the acute  phase of infection. The expected result is Negative. Fact Sheet for Patients:  StrictlyIdeas.no Fact Sheet for Healthcare Providers: BankingDealers.co.za This test is not yet approved or cleared by the [place] FDA and has been authorized for detection and/or diagnosis of SARS-CoV-2 by FDA under an Emergency Use Authorization (EUA).  This EUA will remain in effect (meaning this test can be used) for the duration of the COVID-19 declaration under Section 564(b)(1) of the Act,  21 U.S.C. section 360bbb-3(b)(1), unless the authorization is terminated or revoked sooner. Performed at [facility], [place] [address] [place], [place] [address]   Basic metabolic panel     Status: Abnormal   Collection Time: 09/11/18 11:00 AM  Result Value Ref Range   Sodium 140 135 - 145 mmol/L   Potassium 4.3 3.5 - 5.1 mmol/L   Chloride 103 98 - 111 mmol/L   CO2 27 22 - 32 mmol/L   Glucose, Bld 139 (H) 70 - 99 mg/dL   BUN 18 6 - 20 mg/dL   Creatinine, Ser 0.87 0.61 - 1.24 mg/dL   Calcium 9.0 8.9 - 10.3 mg/dL   GFR calc non Af Amer >60 >60 mL/min   GFR calc Af Amer >60 >60 mL/min   Anion gap 10 5 - 15    Comment: Performed at [facility], [place] [address], [place], [place] [address]  Brain natriuretic peptide     Status: None   Collection Time: 09/11/18 11:00 AM  Result Value Ref Range   B Natriuretic Peptide 30.1 0.0 - 100.0 pg/mL    Comment: Performed at [facility], [place] [address], [place], [place] [address]  CBC with Differential  Status: Abnormal   Collection Time: 09/11/18 11:00 AM  Result Value Ref Range   WBC 26.1 (H) 4.0 - 10.5 K/uL   RBC 4.51 4.22 - 5.81 MIL/uL   Hemoglobin 14.7 13.0 - 17.0 g/dL   HCT 44.7 39.0 - 52.0 %   MCV 99.1 80.0 - 100.0 fL   MCH 32.6 26.0 - 34.0 pg   MCHC 32.9 30.0 - 36.0 g/dL   RDW 12.9 11.5 - 15.5 %   Platelets 239 150 - 400 K/uL   nRBC 0.0 0.0 - 0.2 %   Neutrophils Relative % 88 %   Neutro Abs 22.9 (H) 1.7 - 7.7 K/uL   Lymphocytes Relative 6 %   Lymphs Abs 1.7 0.7 - 4.0 K/uL   Monocytes Relative 5 %   Monocytes Absolute 1.3 (H) 0.1 - 1.0 K/uL   Eosinophils Relative 0 %   Eosinophils Absolute 0.1 0.0 - 0.5 K/uL   Basophils Relative 0 %   Basophils Absolute 0.0 0.0 - 0.1 K/uL   Immature Granulocytes 1 %   Abs Immature Granulocytes 0.15 (H) 0.00 - 0.07 K/uL    Comment: Performed at [facility], [place] [address], [place], [place] [address]  Troponin I  (High Sensitivity)     Status: None   Collection Time: 09/11/18 11:00 AM  Result Value Ref Range   Troponin I (High Sensitivity) 17 <18 ng/L    Comment: (NOTE) Elevated high sensitivity troponin I (hsTnI) values and significant  changes across serial measurements may suggest ACS but many other  chronic and acute conditions are known to elevate hsTnI results.  Refer to the "Links" section for chest pain algorithms and additional  guidance. Performed at [facility], [place] [address], [place], [place] [address]   APTT     Status: None   Collection Time: 09/11/18 11:00 AM  Result Value Ref Range   aPTT 26 24 - 36 seconds    Comment: Performed at [facility], [place] [address], [place], [place] [address]  Protime-INR     Status: None   Collection Time: 09/11/18 11:00 AM  Result Value Ref Range   Prothrombin Time 12.8 11.4 - 15.2 seconds   INR 1.0 0.8 - 1.2    Comment: (NOTE) INR goal varies based on device and disease states. Performed at [facility], [place] [address], [place], [place] [address]   Procalcitonin     Status: None   Collection Time: 09/11/18 11:00 AM  Result Value Ref Range   Procalcitonin 0.29 ng/mL    Comment:        Interpretation: PCT (Procalcitonin) <= 0.5 ng/mL: Systemic infection (sepsis) is not likely. Local bacterial infection is possible. (NOTE)       Sepsis PCT Algorithm           Lower Respiratory Tract                                      Infection PCT Algorithm    ----------------------------     ----------------------------         PCT < 0.25 ng/mL                PCT < 0.10 ng/mL         Strongly encourage             Strongly discourage   discontinuation of antibiotics  initiation of antibiotics    ----------------------------     -----------------------------       PCT 0.25 - 0.50 ng/mL            PCT 0.10 - 0.25 ng/mL               OR       >80% decrease in PCT            Discourage  initiation of                                            antibiotics      Encourage discontinuation           of antibiotics    ----------------------------     -----------------------------         PCT >= 0.50 ng/mL              PCT 0.26 - 0.50 ng/mL               AND        <80% decrease in PCT             Encourage initiation of                                             antibiotics       Encourage continuation           of antibiotics    ----------------------------     -----------------------------        PCT >= 0.50 ng/mL                  PCT > 0.50 ng/mL               AND         increase in PCT                  Strongly encourage                                      initiation of antibiotics    Strongly encourage escalation           of antibiotics                                     -----------------------------                                           PCT <= 0.25 ng/mL                                                 OR                                        >  80% decrease in PCT                                     Discontinue / Do not initiate                                             antibiotics Performed at [place] [address], [place], [place] [address]   Lactic acid, plasma     Status: None   Collection Time: 09/11/18 11:55 AM  Result Value Ref Range   Lactic Acid, Venous 1.7 0.5 - 1.9 mmol/L    Comment: Performed at [facility], [place] [address] [place], [place] [address]  Lactic acid, plasma     Status: None   Collection Time: 09/11/18  1:30 PM  Result Value Ref Range   Lactic Acid, Venous 1.4 0.5 - 1.9 mmol/L    Comment: Performed at [facility], [place] [address] [place], [place] [address]  Troponin I (High Sensitivity)     Status: None   Collection Time: 09/11/18  1:30 PM  Result Value Ref Range   Troponin I (High Sensitivity) 14 <18 ng/L    Comment: (NOTE) Elevated high sensitivity  troponin I (hsTnI) values and significant  changes across serial measurements may suggest ACS but many other  chronic and acute conditions are known to elevate hsTnI results.  Refer to the "Links" section for chest pain algorithms and additional  guidance. Performed at [facility], [place] [address] [place], [place] [address]    Dg Chest Port 1 View  Result Date: 09/11/2018 CLINICAL DATA:  Shortness of breath. EXAM: PORTABLE CHEST 1 VIEW COMPARISON:  08/10/2018 FINDINGS: Normal heart size. No pleural effusion or edema identified. New asymmetric airspace consolidation in the right lung base. Left lung clear. IMPRESSION: 1. Asymmetric opacity in the right lung base is new from previous exam concerning for either pneumonia or aspiration. Electronically Signed   By: Kerby Moors M.D.   On: 09/11/2018 11:21    Pending Labs Unresulted Labs (From admission, onward)    Start     Ordered   09/11/18 1134  Blood Culture (routine x 2)  BLOOD CULTURE X 2,   STAT     09/11/18 1134   09/11/18 1134  Urinalysis, Routine w reflex microscopic  ONCE - STAT,   STAT     09/11/18 1134   09/11/18 1134  Urine culture  ONCE - STAT,   STAT     09/11/18 1134   Signed and Held  D-dimer, quantitative (not at [facility])  Tomorrow morning,   R     Signed and Held   Signed and Held  HIV antibody (Routine Screening)  Once,   R     Signed and Held   Signed and Held  CBC  (enoxaparin (LOVENOX)    CrCl >/= 30 ml/min)  Once,   R    Comments: Baseline for enoxaparin therapy IF NOT ALREADY DRAWN.  Notify MD if PLT < 100 K.    Signed and Held   Signed and Held  Creatinine, serum  (enoxaparin (LOVENOX)    CrCl >/= 30 ml/min)  Once,   R    Comments: Baseline for enoxaparin therapy IF NOT ALREADY DRAWN.  Signed and Held   Signed and Held  Creatinine, serum  (enoxaparin (LOVENOX)    CrCl >/= 30 ml/min)  Weekly,   R    Comments: while on enoxaparin therapy    Signed and Held   Signed and Held  Legionella  Pneumophila Serogp 1 Ur Ag  Once,   R     Signed and Held   Signed and Held  Strep pneumoniae urinary antigen  Once,   R     Signed and Held   Visual merchandiser and Occupational hygienist morning,   R     Signed and Held   Signed and Held  CBC  Tomorrow morning,   R     Signed and Held   Signed and Held  MRSA PCR Screening  (Non-severe pneumonia (non-ICU care) in adult without resistant organism risk factors.)  Once,   R    Question:  Patient immune status  Answer:  Immunocompromised   Signed and Held   Signed and Held  Sputum Culture  (Non-severe pneumonia (non-ICU care) in adult without resistant organism risk factors.)  Once,   R    Question:  Patient immune status  Answer:  Immunocompromised   Signed and Held   Signed and Held  HIV antibody  Once,   R     Signed and Held          Vitals/Pain Today's Vitals   09/11/18 1446 09/11/18 1500 09/11/18 1504 09/11/18 1522  BP:  (!) 153/89    Pulse: 89 97 97 98  Resp:  (!) 21 (!) 21 17  Temp:      TempSrc:      SpO2: 96% 97% 97% 95%  Weight:      Height:      PainSc:        Isolation Precautions No active isolations  Medications Medications  albuterol (VENTOLIN HFA) 108 (90 Base) MCG/ACT inhaler 2 puff (2 puffs Inhalation Given 09/11/18 1055)  ipratropium (ATROVENT HFA) inhaler 2 puff (has no administration in time range)  vancomycin (VANCOCIN) 1,250 mg in sodium chloride 0.9 % 250 mL IVPB (has no administration in time range)  ceFEPIme (MAXIPIME) 2 g in sodium chloride 0.9 % 100 mL IVPB (0 g Intravenous Stopped 09/11/18 1255)  vancomycin (VANCOCIN) 1,750 mg in sodium chloride 0.9 % 500 mL IVPB (0 mg Intravenous Stopped 09/11/18 1554)  sodium chloride 0.9 % bolus 2,559 mL (0 mL/kg  85.3 kg Intravenous Stopped 09/11/18 1432)    Mobility walks Low fall risk   Focused Assessments Pulmonary Assessment Handoff:  Lung sounds: Bilateral Breath Sounds: Diminished, Expiratory wheezes L Breath Sounds: Diminished, Expiratory  wheezes R Breath Sounds: Diminished, Expiratory wheezes O2 Device: Nasal Cannula O2 Flow Rate (L/min): 2 L/min      R Recommendations: See Admitting Provider Note  Report given to:   Additional Notes:

## 2018-09-11 NOTE — ED Provider Notes (Addendum)
[facility] DEPT Provider Note   CSN: [id number] Arrival date & time: 09/11/18  1030     History   Chief Complaint Chief Complaint  Patient presents with  . Shortness of Breath    HPI James [name] is a 58 y.o. male with a history of COPD, not on home oxygen but taking 20 mg of prednisone daily as maintenance therapy, presenting to the emergency department with shortness of breath.  He reports he was in his usual state of health yesterday with no issues.  However woke up this morning feeling particularly short of breath.  This occurs both with activity and at rest.  He believes this is similar to his prior COPD exacerbations.  He reports he was treated for pneumonia at the end of July with antibiotics, which he completed the full course of.    He denies any chest pain, leg swelling, history of MI, cardiac stents, or heart failure.  He is normally able to ambulate several blocks without any difficulties.  He denies any recent fevers, chills, sore throat, headache, myalgias.  Denies any sick contacts in the house.  He has been tested negative for COVID several times in hospitalizations recently.  No hemoptysis or asymmetric LE edema. Patient denies personal or family history of DVT or PE. No recent hormone use (including OCP); travel for >6 hours; prolonged immobilization for greater than 3 days; surgeries or trauma in the last 4 weeks; or malignancy with treatment within 6 months.  Reports he quit smoking smoking 5 years ago.   Follows with Dr. Christinia Gully of Pulmonology     HPI  Past Medical History:  Diagnosis Date  . Acute on chronic respiratory failure with hypoxia ([place]) 06/08/2013  . COPD (chronic obstructive pulmonary disease) ([place])    not on home  . Pneumonia 04/06/2016  . Pneumothorax    2016, fell from ladder  . Tick bite 06/03/2018    Patient Active Problem List   Diagnosis Date Noted  . Pneumonia 09/11/2018  . Multifocal pneumonia  08/09/2018  . Marijuana abuse 08/09/2018  . CAP (community acquired pneumonia) 05/17/2018  . COPD exacerbation ([place]) 02/16/2018  . Hypertension 05/17/2016  . Low back pain radiating to both legs 04/18/2016  . History of tobacco abuse 04/06/2016  . Alcohol abuse 04/06/2016  . Elevated liver enzymes 04/06/2016  . COPD mixed type ([place]) 03/26/2016    Past Surgical History:  Procedure Laterality Date  . VIDEO ASSISTED THORACOSCOPY (VATS)/THOROCOTOMY Left 06/11/2013   Procedure: VIDEO ASSISTED THORACOSCOPY (VATS)/THOROCOTOMY;  Surgeon: Ivin Poot, MD;  Location: [facility];  Service: Thoracic;  Laterality: Left;  [name] VIDEO BRONCHOSCOPY N/A 06/11/2013   Procedure: VIDEO BRONCHOSCOPY;  Surgeon: Ivin Poot, MD;  Location: [facility];  Service: Thoracic;  Laterality: N/A;  . VIDEO BRONCHOSCOPY N/A 08/10/2018   Procedure: VIDEO BRONCHOSCOPY WITH FLUORO;  Surgeon: Candee Furbish, MD;  Location[facility]COPY;  Service: Endoscopy;  Laterality: N/A;        Home Medications    Prior to Admission medications   Medication Sig Start Date End Date Taking? Authorizing Provider  albuterol (PROVENTIL HFA;VENTOLIN HFA) 108 (90 Base) MCG/ACT inhaler Inhale 1-2 puffs into the lungs every 6 (six) hours as needed for wheezing or shortness of breath. 03/29/18   Scot Jun, FNP  albuterol (PROVENTIL) (2.5 MG/3ML) 0.083% nebulizer solution Take 3 mLs (2.5 mg total) by nebulization every 6 (six) hours as needed for wheezing or shortness of breath. 06/20/18  Scot Jun, FNP  amoxicillin-clavulanate (AUGMENTIN) 875-125 MG tablet Take 1 tablet by mouth 2 (two) times daily.    [provider]  calcium carbonate (TUMS - DOSED IN MG ELEMENTAL CALCIUM) 500 MG chewable tablet Chew 1 tablet by mouth daily as needed for indigestion or heartburn.    [provider]  folic acid (FOLVITE) 1 MG tablet Take 1 tablet (1 mg total) by mouth daily. Patient not taking: Reported on 08/28/2018 07/18/18   Scot Jun, FNP  guaiFENesin (MUCINEX) 600 MG 12 hr tablet Take 1 tablet (600 mg total) by mouth 2 (two) times daily. 08/11/18   Mikhail, Velta Addison, DO  losartan (COZAAR) 100 MG tablet Take 1 tablet (100 mg total) by mouth daily. 04/27/18   [name], MD  mometasone-formoterol (DULERA) 200-5 MCG/ACT AERO Inhale 2 puffs into the lungs 2 (two) times daily. 07/18/18   Scot Jun, FNP  Potassium (POTASSIMIN PO) Take by mouth daily as needed.    [provider]  predniSONE (DELTASONE) 20 MG tablet Take 20 mg by mouth daily with breakfast.    [provider]  Respiratory Therapy Supplies (FLUTTER) DEVI Use 4 times daily 04/27/18   [name], MD  Spacer/Aero-Holding Chambers (AEROCHAMBER MV) inhaler Use as instructed 08/03/18   [name], MD  thiamine (VITAMIN B-1) 100 MG tablet Take 1 tablet (100 mg total) by mouth daily. 08/03/18   [name], MD  umeclidinium bromide (INCRUSE ELLIPTA) 62.5 MCG/INH AEPB Inhale 1 puff into the lungs daily. 07/25/18   [name], MD    Family History Family History  Problem Relation Age of Onset  . Heart disease Father   . COPD Sister     Social History Social History   Tobacco Use  . Smoking status: Former Smoker    Packs/day: 1.50    Years: 40.00    Pack years: 60.00    Types: Cigarettes    Quit date: 2014    Years since quitting: 6.6  . Smokeless tobacco: Current User    Types: Chew  Substance Use Topics  . Alcohol use: Yes    Alcohol/week: 28.0 standard drinks    Types: 28 Cans of beer per week    Comment: 4 beers a night; + jitters with not drinking, denies DTs or seizures  . Drug use: Yes    Types: Marijuana    Comment: daily; quit using crack and methamphetamine about 5 months ago      Allergies   Patient has no known allergies.   Review of Systems Review of Systems  Constitutional: Negative for chills and fever.  HENT: Negative for ear pain and sore throat.   Eyes: Negative for  pain and visual disturbance.  Respiratory: Positive for chest tightness, shortness of breath and wheezing. Negative for cough and choking.   Cardiovascular: Negative for chest pain, palpitations and leg swelling.  Gastrointestinal: Negative for abdominal pain, nausea and vomiting.  Musculoskeletal: Negative for arthralgias and back pain.  Skin: Negative for color change and rash.  Neurological: Negative for seizures and syncope.  Psychiatric/Behavioral: Negative for agitation and confusion.  All other systems reviewed and are negative.    Physical Exam Updated Vital Signs BP 106/68   Pulse (!) 110   Temp 98.4 F (36.9 C) (Oral)   Resp (!) 21   Ht 5' 9.5" (1.765 m)   Wt 85.3 kg   SpO2 90%   BMI 27.36 kg/m   Physical Exam Vitals  signs and nursing note reviewed.  Constitutional:      Appearance: He is well-developed.  HENT:     Head: Normocephalic and atraumatic.  Eyes:     Conjunctiva/sclera: Conjunctivae normal.  Neck:     Musculoskeletal: Neck supple.  Cardiovascular:     Rate and Rhythm: Normal rate and regular rhythm.  Pulmonary:     Effort: Pulmonary effort is normal. No respiratory distress.     Breath sounds: Normal breath sounds.     Comments: 95% on room air Diffuse expiratory wheezing in L>R lung Diminished breath sounds in right mid and lower lung fields Speaking comfortably in full sentences Abdominal:     Palpations: Abdomen is soft.     Tenderness: There is no abdominal tenderness.  Musculoskeletal:     Right lower leg: No edema.     Left lower leg: No edema.  Skin:    General: Skin is warm and dry.  Neurological:     General: No focal deficit present.     Mental Status: He is alert.  Psychiatric:        Mood and Affect: Mood normal.        Behavior: Behavior normal.      ED Treatments / Results  Labs (all labs ordered are listed, but only abnormal results are displayed) Labs Reviewed  BASIC METABOLIC PANEL - Abnormal; Notable for the  following components:      Result Value   Glucose, Bld 139 (*)    All other components within normal limits  CBC WITH DIFFERENTIAL/PLATELET - Abnormal; Notable for the following components:   WBC 26.1 (*)    Neutro Abs 22.9 (*)    Monocytes Absolute 1.3 (*)    Abs Immature Granulocytes 0.15 (*)    All other components within normal limits  SARS CORONAVIRUS 2 (HOSPITAL ORDER, PERFORMED [facility])  CULTURE, BLOOD (ROUTINE X 2)  CULTURE, BLOOD (ROUTINE X 2)  URINE CULTURE  BRAIN NATRIURETIC PEPTIDE  LACTIC ACID, PLASMA  APTT  PROTIME-INR  PROCALCITONIN  LACTIC ACID, PLASMA  URINALYSIS, ROUTINE W REFLEX MICROSCOPIC  TROPONIN I (HIGH SENSITIVITY)  TROPONIN I (HIGH SENSITIVITY)    EKG EKG Interpretation  Date/Time:  Monday September 11 2018 11:02:54 EDT Ventricular Rate:  111 PR Interval:    QRS Duration: 90 QT Interval:  312 QTC Calculation: 424 R Axis:   52 Text Interpretation:  Sinus tachycardia No STEMI  No acute changes from prior EKG  Confirmed by Octaviano Glow [phone]) on 09/11/2018 11:37:33 AM   Radiology Dg Chest Port 1 View  Result Date: 09/11/2018 CLINICAL DATA:  Shortness of breath. EXAM: PORTABLE CHEST 1 VIEW COMPARISON:  08/10/2018 FINDINGS: Normal heart size. No pleural effusion or edema identified. New asymmetric airspace consolidation in the right lung base. Left lung clear. IMPRESSION: 1. Asymmetric opacity in the right lung base is new from previous exam concerning for either pneumonia or aspiration. Electronically Signed   By: Kerby Moors M.D.   On: 09/11/2018 11:21    Procedures .Critical Care Performed by: Wyvonnia Dusky, MD Authorized by: Wyvonnia Dusky, MD   Critical care provider statement:    Critical care time (minutes):  30   Critical care was necessary to treat or prevent imminent or life-threatening deterioration of the following conditions:  Sepsis   Critical care was time spent personally by me on the following  activities:  Discussions with consultants, evaluation of patient's response to treatment, examination of patient, ordering and performing  treatments and interventions, ordering and review of laboratory studies, ordering and review of radiographic studies, pulse oximetry, re-evaluation of patient's condition, obtaining history from patient or surrogate and review of old charts Comments:     IV fluids and antibiotics for sepsis   (including critical care time)  Medications Ordered in ED Medications  albuterol (VENTOLIN HFA) 108 (90 Base) MCG/ACT inhaler 2 puff (2 puffs Inhalation Given 09/11/18 1055)  ipratropium (ATROVENT HFA) inhaler 2 puff (has no administration in time range)  vancomycin (VANCOCIN) 1,750 mg in sodium chloride 0.9 % 500 mL IVPB (has no administration in time range)  ceFEPIme (MAXIPIME) 2 g in sodium chloride 0.9 % 100 mL IVPB (2 g Intravenous New Bag/Given 09/11/18 1156)  sodium chloride 0.9 % bolus 2,559 mL (2,559 mLs Intravenous New Bag/Given 09/11/18 1237)     Initial Impression / Assessment and Plan / ED Course  I have reviewed the triage vital signs and the nursing notes.  Pertinent labs & imaging results that were available during my care of the patient were reviewed by me and considered in my medical decision making (see chart for details).  58 year old male with recurrent aspiration pneumonia presented emergency department with shortness of breath of acute onset this morning.  He has diminished breath sounds in the right mid and lower lung fields.  He is afebrile here but tachycardic.  He is satting 94% on room air.  We will obtain basic labs are initial work-up and give albuterol and Atrovent as needed.  Will send another COVID test.  Chest x-ray is pending.  He is afebrile, I did not initiate sepsis workup upon arrival, but I will reassess once we have his initial work-up completed.  He is otherwise hemodynamically stable upon arrival.  Symptoms are less likely due to  ACS or PE given his history.  Clinical Course as of Sep 11 1238  Mon Sep 11, 2018  1134 With his leukocytosis and new right sided infiltrate on chest x-ray, admit this patient a sepsis alert.  Will order antibiotic coverage for hospital-acquired pneumonia.  Additional blood work is ordered including a lactate.  We will verify his weight prior to ordering fluids.  Per chart review this is unfortunately appears to be chronic readmission process and problem for this gentleman.   [MT]    Clinical Course User Index [MT] Quinita Kostelecky, Carola Rhine, MD        Final Clinical Impressions(s) / ED Diagnoses   Final diagnoses:  Recurrent pneumonia  Sepsis, due to unspecified organism, unspecified whether acute organ dysfunction present [facility])    ED Discharge Orders    None       Benno Brensinger, Carola Rhine, MD 09/11/18 1240    Wyvonnia Dusky, MD 09/20/18 1108

## 2018-09-11 NOTE — ED Notes (Signed)
1st bolus finished. 2nd bolus initiated.

## 2018-09-11 NOTE — Progress Notes (Signed)
A consult was received from an ED physician for Vancomycin and Cefepime per pharmacy dosing.  The patient's profile has been reviewed for ht/wt/allergies/indication/available labs.    A one time order has been placed for Vancomycin 1750mg  IV and Cefepime 2g IV.  Further antibiotics/pharmacy consults should be ordered by admitting physician if indicated.                       Thank you, Luiz Ochoa 09/11/2018  11:40 AM

## 2018-09-11 NOTE — Progress Notes (Signed)
Pharmacy Antibiotic Note  James [name] is a 58 y.o. male admitted on 09/11/2018 with pneumonia.  Pharmacy has been consulted for Vancomycin dosing.   Plan: Vancomycin 1750mg  IV x 1 given in the ED. Continue with Vancomycin 1250mg  IV q12h.  Vancomycin levels at steady state, as indicated.  Cefepime, Flagyl, Azithromycin per MD.   Monitor renal function, cultures, clinical course, and for ability to de-escalate antibiotics.   Height: 5' 9.5" (176.5 cm) Weight: 188 lb (85.3 kg) IBW/kg (Calculated) : 71.85  Temp (24hrs), Avg:98.4 F (36.9 C), Min:98.4 F (36.9 C), Max:98.4 F (36.9 C)  Recent Labs  Lab 09/11/18 1100 09/11/18 1155  WBC 26.1*  --   CREATININE 0.87  --   LATICACIDVEN  --  1.7    Estimated Creatinine Clearance: 95.3 mL/min (by C-G formula based on SCr of 0.87 mg/dL).    No Known Allergies  Antimicrobials this admission: 9/7 Vancomycin >> 9/7 Cefepime >> 9/7 Metronidazole >> 9/7 Azithromycin >>  Dose adjustments this admission: --  Microbiology results: 9/7 BCx: sent 9/7 UCx: ordered  9/7 Sputum: ordered 9/7 COVID: negative  9/7 MRSA PCR: ordered 9/7 Strep pneumo urinary antigen: ordered 9/7 Legionella urinary antigen: ordered 9/7 HIV antibody: ordered   Thank you for allowing pharmacy to be a part of this patient's care.   Lindell Spar, PharmD, BCPS Clinical Pharmacist  09/11/2018 1:32 PM

## 2018-09-11 NOTE — ED Notes (Signed)
Report given to 5E 

## 2018-09-11 NOTE — ED Notes (Signed)
Patient received a total of 2,600 mLs IV NS.

## 2018-09-12 DIAGNOSIS — J441 Chronic obstructive pulmonary disease with (acute) exacerbation: Secondary | ICD-10-CM

## 2018-09-12 DIAGNOSIS — A419 Sepsis, unspecified organism: Secondary | ICD-10-CM

## 2018-09-12 DIAGNOSIS — I10 Essential (primary) hypertension: Secondary | ICD-10-CM

## 2018-09-12 DIAGNOSIS — F1210 Cannabis abuse, uncomplicated: Secondary | ICD-10-CM

## 2018-09-12 LAB — BASIC METABOLIC PANEL
Anion gap: 8 (ref 5–15)
BUN: 18 mg/dL (ref 6–20)
CO2: 24 mmol/L (ref 22–32)
Calcium: 8.4 mg/dL — ABNORMAL LOW (ref 8.9–10.3)
Chloride: 106 mmol/L (ref 98–111)
Creatinine, Ser: 0.61 mg/dL (ref 0.61–1.24)
GFR calc Af Amer: >60 mL/min (ref >60)
GFR calc non Af Amer: >60 mL/min (ref >60)
Glucose, Bld: 121 mg/dL — ABNORMAL HIGH (ref 70–99)
Potassium: 4.3 mmol/L (ref 3.5–5.1)
Sodium: 138 mmol/L (ref 135–145)

## 2018-09-12 LAB — CBC
HCT: 43.1 % (ref 39.0–52.0)
Hemoglobin: 13.8 g/dL (ref 13.0–17.0)
MCH: 32.9 pg (ref 26.0–34.0)
MCHC: 32 g/dL (ref 30.0–36.0)
MCV: 102.9 fL — ABNORMAL HIGH (ref 80.0–100.0)
Platelets: 225 10*3/uL (ref 150–400)
RBC: 4.19 MIL/uL — ABNORMAL LOW (ref 4.22–5.81)
RDW: 13 % (ref 11.5–15.5)
WBC: 19.6 10*3/uL — ABNORMAL HIGH (ref 4.0–10.5)
nRBC: 0 % (ref 0.0–0.2)

## 2018-09-12 LAB — URINALYSIS, ROUTINE W REFLEX MICROSCOPIC
Bacteria, UA: NONE SEEN
Bilirubin Urine: NEGATIVE
Glucose, UA: >=500 mg/dL — AB
Hgb urine dipstick: NEGATIVE
Ketones, ur: NEGATIVE mg/dL
Leukocytes,Ua: NEGATIVE
Nitrite: NEGATIVE
Protein, ur: NEGATIVE mg/dL
Specific Gravity, Urine: 1.015 (ref 1.005–1.030)
pH: 5 (ref 5.0–8.0)

## 2018-09-12 LAB — D-DIMER, QUANTITATIVE: D-Dimer, Quant: 0.43 ug/mL-FEU (ref 0.00–0.50)

## 2018-09-12 LAB — STREP PNEUMONIAE URINARY ANTIGEN: Strep Pneumo Urinary Antigen: NEGATIVE

## 2018-09-12 MED ORDER — AMOXICILLIN-POT CLAVULANATE 875-125 MG PO TABS
1.0000 | ORAL_TABLET | Freq: Two times a day (BID) | ORAL | Status: DC
  Administered 2018-09-12 – 2018-09-13 (×2): 1 via ORAL
  Filled 2018-09-12 (×2): qty 1

## 2018-09-12 NOTE — Evaluation (Signed)
Physical Therapy Evaluation Patient Details Name: James [name] MRN: [id number] DOB: [birth year] Today's Date: 09/12/2018   History of Present Illness  58 yo male admitted with Pna, sepsis. hx of COPD, alcoholism, marijuana use  Clinical Impression  On eval, pt was Min assist for mobility. He is unsteady and at risk for falls when ambulating. SpO2 96% on RA during session. Will continue to follow and progress activity as tolerated.     Follow Up Recommendations Supervision for mobility/OOB    Equipment Recommendations  (continuing to assess)    Recommendations for Other Services       Precautions / Restrictions Precautions Precautions: Fall Restrictions Weight Bearing Restrictions: No      Mobility  Bed Mobility Overal bed mobility: Needs Assistance Bed Mobility: Supine to Sit;Sit to Supine     Supine to sit: Supervision;HOB elevated Sit to supine: Supervision;HOB elevated      Transfers Overall transfer level: Needs assistance   Transfers: Sit to/from Stand Sit to Stand: Min assist         General transfer comment: Assist to steady.  Ambulation/Gait Ambulation/Gait assistance: Min assist Gait Distance (Feet): 200 Feet Assistive device: None Gait Pattern/deviations: Step-through pattern;Drifts right/left     General Gait Details: unsteady. Assist to stabilize throughout distance. Intermittent shuffling steps. O2 96% on RA, dyspnea 2/4.  Stairs            Wheelchair Mobility    Modified Rankin (Stroke Patients Only)       Balance Overall balance assessment: Needs assistance           Standing balance-Leahy Scale: Fair                               Pertinent Vitals/Pain Pain Assessment: No/denies pain    Home Living Family/patient expects to be discharged to:: Private residence Living Arrangements: Alone Available Help at Discharge: Family;Friend(s);Available PRN/intermittently Type of Home: House Home Access: Stairs  to enter Entrance Stairs-Rails: None Entrance Stairs-Number of Steps: 2 Home Layout: One level Home Equipment: None      Prior Function Level of Independence: Independent               Hand Dominance        Extremity/Trunk Assessment   Upper Extremity Assessment Upper Extremity Assessment: Overall WFL for tasks assessed    Lower Extremity Assessment Lower Extremity Assessment: Generalized weakness    Cervical / Trunk Assessment Cervical / Trunk Assessment: Normal  Communication   Communication: No difficulties  Cognition Arousal/Alertness: Awake/alert Behavior During Therapy: WFL for tasks assessed/performed Overall Cognitive Status: Within Functional Limits for tasks assessed                                        General Comments      Exercises     Assessment/Plan    PT Assessment Patient needs continued PT services  PT Problem List Decreased mobility;Decreased balance;Decreased activity tolerance       PT Treatment Interventions Gait training;DME instruction;Therapeutic activities;Therapeutic exercise;Functional mobility training;Balance training;Patient/family education    PT Goals (Current goals can be found in the Care Plan section)  Acute Rehab PT Goals Patient Stated Goal: home PT Goal Formulation: With patient Time For Goal Achievement: 09/26/18 Potential to Achieve Goals: Good    Frequency Min 3X/week   Barriers to discharge  Co-evaluation               AM-PAC PT "6 Clicks" Mobility  Outcome Measure Help needed turning from your back to your side while in a flat bed without using bedrails?: A Little Help needed moving from lying on your back to sitting on the side of a flat bed without using bedrails?: A Little Help needed moving to and from a bed to a chair (including a wheelchair)?: A Little Help needed standing up from a chair using your arms (e.g., wheelchair or bedside chair)?: A Little Help needed  to walk in hospital room?: A Little Help needed climbing 3-5 steps with a railing? : A Little 6 Click Score: 18    End of Session Equipment Utilized During Treatment: Gait belt Activity Tolerance: Patient tolerated treatment well Patient left: in bed;with call bell/phone within reach;with bed alarm set   PT Visit Diagnosis: Unsteadiness on feet (R26.81)    Time: 9563-8756 PT Time Calculation (min) (ACUTE ONLY): 15 min   Charges:   PT Evaluation $PT Eval Moderate Complexity: [place], PT Acute Rehabilitation Services Pager: [phone] Office: [phone]

## 2018-09-12 NOTE — Progress Notes (Signed)
PROGRESS NOTE    James [name]  UYQ:[id number] DOB: 1960-06-30 DOA: 09/11/2018 PCP: Scot Jun, FNP   Brief Narrative: James [name] is a 58 y.o. male with history of COPD on prednisone, HTN, alcohol use disorder, marijuana use, tobacco user. Patient presented secondary to dyspnea and found to have evidence of pneumonia and a COPD exacerbation in addition to sepsis physiology on admission.   Assessment & Plan:   Active Problems:   Pneumonia   Right lower lobe pneumonia Concern for aspiration pneumonia. Started on broad spectrum antibiotics with Vancomycin, cefepime, Flagyl and Azithromycin. Patient with productive cough. Afebrile overnight. WBC trended down but still significantly elevated. Strep pneumo urine ag negative. -Transition to Unasyn and azithromycin -Follow up blood, sputum cultures, legionella ur ag  Sepsis Secondary to pneumonia. Present on admission. Management above. -Urine cultures sent and are pending  COPD exacerbation In setting of pneumonia infection. Started on steroids in addition to LABA/LAMA/ICS. Steroids possibly contributing to elevated WBC. -Continue current treatment  Leukocytosis As mentioned above, in setting of infection and steroids. -Trend CBC  Alcohol use disorder Patient states his last drink was on 9/6. He states he does not drink very much. He states his tremors are chronic -Continue CIWA, MVI, folic acid  Weakness -PT/OT evals pending  Tobacco use Marijuana use Cessation discussed on admission  DVT prophylaxis: Lovenox Code Status:   Code Status: Full Code Family Communication: None Disposition Plan: Discharge in 1-2 days   Consultants:   None  Procedures:   None  Antimicrobials:  Vancomycin (9/7>>9/8)  Cefepime (9/7>>9/8)  Flagyl (9/7>>9/8)  Unasyn (9/8>>  Azithromycin (9/7>>   Subjective: No chest pain or dyspnea. Productive cough. No other concerns.  Objective: Vitals:   09/11/18 2200  09/12/18 0444 09/12/18 0814 09/12/18 1210  BP:  (!) 147/85  134/85  Pulse: 84 84  70  Resp:  20  20  Temp:  98 F (36.7 C)  98 F (36.7 C)  TempSrc:  Oral    SpO2: 96% 98% 97% 98%  Weight:      Height:        Intake/Output Summary (Last 24 hours) at 09/12/2018 1555 Last data filed at 09/12/2018 1520 Gross per 24 hour  Intake 2450.62 ml  Output -  Net 2450.62 ml   Filed Weights   09/11/18 1219  Weight: 85.3 kg    Examination:  General exam: Appears calm and comfortable Respiratory system: Clear to auscultation. Respiratory effort normal. Cardiovascular system: S1 & S2 heard, RRR. No murmurs, rubs, gallops or clicks. Gastrointestinal system: Abdomen is nondistended, soft and nontender. No organomegaly or masses felt. Normal bowel sounds heard. Central nervous system: Alert and oriented. No focal neurological deficits. Extremities: No edema. No calf tenderness Skin: No cyanosis. No rashes Psychiatry: Judgement and insight appear normal. Mood & affect appropriate.     Data Reviewed: I have personally reviewed following labs and imaging studies  CBC: Recent Labs  Lab 09/11/18 1100 09/12/18 0439  WBC 26.1* 19.6*  NEUTROABS 22.9*  --   HGB 14.7 13.8  HCT 44.7 43.1  MCV 99.1 102.9*  PLT 239 638   Basic Metabolic Panel: Recent Labs  Lab 09/11/18 1100 09/12/18 0439  NA 140 138  K 4.3 4.3  CL 103 106  CO2 27 24  GLUCOSE 139* 121*  BUN 18 18  CREATININE 0.87 0.61  CALCIUM 9.0 8.4*   GFR: Estimated Creatinine Clearance: 103.6 mL/min (by C-G formula based on SCr of 0.61 mg/dL). Liver Function  Tests: No results for input(s): AST, ALT, ALKPHOS, BILITOT, PROT, ALBUMIN in the last 168 hours. No results for input(s): LIPASE, AMYLASE in the last 168 hours. No results for input(s): AMMONIA in the last 168 hours. Coagulation Profile: Recent Labs  Lab 09/11/18 1100  INR 1.0   Cardiac Enzymes: No results for input(s): CKTOTAL, CKMB, CKMBINDEX, TROPONINI in the last  168 hours. BNP (last 3 results) No results for input(s): PROBNP in the last 8760 hours. HbA1C: No results for input(s): HGBA1C in the last 72 hours. CBG: No results for input(s): GLUCAP in the last 168 hours. Lipid Profile: No results for input(s): CHOL, HDL, LDLCALC, TRIG, CHOLHDL, LDLDIRECT in the last 72 hours. Thyroid Function Tests: No results for input(s): TSH, T4TOTAL, FREET4, T3FREE, THYROIDAB in the last 72 hours. Anemia Panel: No results for input(s): VITAMINB12, FOLATE, FERRITIN, TIBC, IRON, RETICCTPCT in the last 72 hours. Sepsis Labs: Recent Labs  Lab 09/11/18 1100 09/11/18 1155 09/11/18 1330  PROCALCITON 0.29  --   --   LATICACIDVEN  --  1.7 1.4    Recent Results (from the past 240 hour(s))  SARS Coronavirus 2 [facility] order, Performed in [facility] hospital lab) Nasopharyngeal Nasopharyngeal Swab     Status: None   Collection Time: 09/11/18 10:49 AM   Specimen: Nasopharyngeal Swab  Result Value Ref Range Status   SARS Coronavirus 2 NEGATIVE NEGATIVE Final    Comment: (NOTE) If result is NEGATIVE SARS-CoV-2 target nucleic acids are NOT DETECTED. The SARS-CoV-2 RNA is generally detectable in upper and lower  respiratory specimens during the acute phase of infection. The lowest  concentration of SARS-CoV-2 viral copies this assay can detect is 250  copies / mL. A negative result does not preclude SARS-CoV-2 infection  and should not be used as the sole basis for treatment or other  patient management decisions.  A negative result may occur with  improper specimen collection / handling, submission of specimen other  than nasopharyngeal swab, presence of viral mutation(s) within the  areas targeted by this assay, and inadequate number of viral copies  (<250 copies / mL). A negative result must be combined with clinical  observations, patient history, and epidemiological information. If result is POSITIVE SARS-CoV-2 target nucleic acids are DETECTED. The  SARS-CoV-2 RNA is generally detectable in upper and lower  respiratory specimens dur ing the acute phase of infection.  Positive  results are indicative of active infection with SARS-CoV-2.  Clinical  correlation with patient history and other diagnostic information is  necessary to determine patient infection status.  Positive results do  not rule out bacterial infection or co-infection with other viruses. If result is PRESUMPTIVE POSTIVE SARS-CoV-2 nucleic acids MAY BE PRESENT.   A presumptive positive result was obtained on the submitted specimen  and confirmed on repeat testing.  While 2019 novel coronavirus  (SARS-CoV-2) nucleic acids may be present in the submitted sample  additional confirmatory testing may be necessary for epidemiological  and / or clinical management purposes  to differentiate between  SARS-CoV-2 and other Sarbecovirus currently known to infect humans.  If clinically indicated additional testing with an alternate test  methodology [phone]) is advised. The SARS-CoV-2 RNA is generally  detectable in upper and lower respiratory sp ecimens during the acute  phase of infection. The expected result is Negative. Fact Sheet for Patients:  StrictlyIdeas.no Fact Sheet for Healthcare Providers: BankingDealers.co.za This test is not yet approved or cleared by the [place] FDA and has been authorized for detection and/or  diagnosis of SARS-CoV-2 by FDA under an Emergency Use Authorization (EUA).  This EUA will remain in effect (meaning this test can be used) for the duration of the COVID-19 declaration under Section 564(b)(1) of the Act, 21 U.S.C. section 360bbb-3(b)(1), unless the authorization is terminated or revoked sooner. Performed at [facility], [place] [address] [place], [place] [address]   Blood Culture (routine x 2)     Status: None (Preliminary result)   Collection Time: 09/11/18 11:34 AM    Specimen: Right Antecubital; Blood  Result Value Ref Range Status   Specimen Description   Final    RIGHT ANTECUBITAL Performed at [facility] [address], [place], [place] [address]    Special Requests   Final    BOTTLES DRAWN AEROBIC AND ANAEROBIC Blood Culture adequate volume Performed at [facility] [address] [place], [place] [address]    Culture   Final    NO GROWTH < 24 HOURS Performed at [facility] [address] [place], [place] [address]    Report Status PENDING  Incomplete  Blood Culture (routine x 2)     Status: None (Preliminary result)   Collection Time: 09/11/18 11:39 AM   Specimen: Left Antecubital; Blood  Result Value Ref Range Status   Specimen Description   Final    LEFT ANTECUBITAL Performed at [facility] [address], [place], [place] [address]    Special Requests   Final    BOTTLES DRAWN AEROBIC AND ANAEROBIC Blood Culture adequate volume Performed at [facility] [address] [place], [place] [address]    Culture   Final    NO GROWTH < 24 HOURS Performed at [facility] [address] [place],  [address]    Report Status PENDING  Incomplete  MRSA PCR Screening     Status: None   Collection Time: 09/11/18  7:50 PM   Specimen: Nasal Mucosa; Nasopharyngeal  Result Value Ref Range Status   MRSA by PCR NEGATIVE NEGATIVE Final    Comment:        The GeneXpert MRSA Assay (FDA approved for NASAL specimens only), is one component of a comprehensive MRSA colonization surveillance program. It is not intended to diagnose MRSA infection nor to guide or monitor treatment for MRSA infections. Performed at [facility], [place] [address] [place],  [address]          Radiology Studies: Dg Chest Port 1 View  Result Date: 09/11/2018 CLINICAL DATA:  Shortness of breath. EXAM: PORTABLE CHEST 1 VIEW COMPARISON:   08/10/2018 FINDINGS: Normal heart size. No pleural effusion or edema identified. New asymmetric airspace consolidation in the right lung base. Left lung clear. IMPRESSION: 1. Asymmetric opacity in the right lung base is new from previous exam concerning for either pneumonia or aspiration. Electronically Signed   By: Kerby Moors M.D.   On: 09/11/2018 11:21        Scheduled Meds: . amoxicillin-clavulanate  1 tablet Oral Q12H  . budesonide (PULMICORT) nebulizer solution  0.5 mg Nebulization BID  . enoxaparin (LOVENOX) injection  40 mg Subcutaneous Q24H  . folic acid  1 mg Oral Daily  . LORazepam  0-4 mg Intravenous Q6H   Followed by  . [START ON 09/13/2018] LORazepam  0-4 mg Intravenous Q12H  . methylPREDNISolone (SOLU-MEDROL) injection  60 mg Intravenous Q12H  . multivitamin with minerals  1 tablet Oral Daily  . thiamine  100 mg Oral Daily   Or  . thiamine  100 mg Intravenous Daily  . umeclidinium bromide  1 puff Inhalation Daily   Continuous Infusions: . azithromycin 500 mg (09/11/18 2127)     LOS: 1 day     Cordelia Poche, MD [facility] 09/12/2018, 3:55 PM  If 7PM-7AM, please contact night-coverage [email or web address]

## 2018-09-12 NOTE — Evaluation (Signed)
Clinical/Bedside Swallow Evaluation Patient Details  Name: CALIN FANTROY MRN: [id number] Date of Birth: 07-15-1960  Today's Date: 09/12/2018 Time: SLP Start Time (ACUTE ONLY): 2683 SLP Stop Time (ACUTE ONLY): 1414 SLP Time Calculation (min) (ACUTE ONLY): 19 min  Past Medical History:  Past Medical History:  Diagnosis Date  . Acute on chronic respiratory failure with hypoxia ([place]) 06/08/2013  . COPD (chronic obstructive pulmonary disease) ([place])    not on home  . Dyspnea   . Hypertension   . Pneumonia 04/06/2016  . Pneumothorax    2016, fell from ladder  . Tick bite 06/03/2018   Past Surgical History:  Past Surgical History:  Procedure Laterality Date  . VIDEO ASSISTED THORACOSCOPY (VATS)/THOROCOTOMY Left 06/11/2013   Procedure: VIDEO ASSISTED THORACOSCOPY (VATS)/THOROCOTOMY;  Surgeon: Ivin Poot, MD;  Location: [place];  Service: Thoracic;  Laterality: Left;  [name] VIDEO BRONCHOSCOPY N/A 06/11/2013   Procedure: VIDEO BRONCHOSCOPY;  Surgeon: Ivin Poot, MD;  Location: [facility];  Service: Thoracic;  Laterality: N/A;  . VIDEO BRONCHOSCOPY N/A 08/10/2018   Procedure: VIDEO BRONCHOSCOPY WITH FLUORO;  Surgeon: Candee Furbish, MD;  Location[facility] ENDOSCOPY;  Service: Endoscopy;  Laterality: N/A;   HPI:      Assessment / Plan / Recommendation Clinical Impression  Patient with negative cranial nerve exam.  He passed 3 ounce Yale water test without indications of difficulties.  He does however report he will cough with intake - mostly with liquids.  Note pt had a recent MBS August 2020 that was negative for dysphagia.  However he did have appearance of thickening below epiglottis. On recent bronch per prior MBS notes, pt had "leukoplakia or thrush on the vocal cords".  Upon SlP return to pt's room after left to provide him with Sprite, he was noted to be coughing significantly with some mild dyspnea.  He then reported that he coughs on liquids and times.   Recommend continue diet and SlP will follow up  to provide pt with compensation strategies to mitigate his symptoms most notably addressing reciprocity of swallow/respirations.  .Also question given pt's h/o ETOH use, if he may have aspirated secretions when lethargic contributing to his pna. SLP Visit Diagnosis: Dysphagia, unspecified (R13.10)    Aspiration Risk  Mild aspiration risk    Diet Recommendation Regular;Thin liquid   Liquid Administration via: Cup;Straw Medication Administration: Whole meds with liquid Supervision: Patient able to self feed Compensations: Slow rate;Small sips/bites Postural Changes: Seated upright at 90 degrees;Remain upright for at least 30 minutes after po intake    Other  Recommendations Recommended Consults: (? ENT referral as an OP given laryngeal findings from prior bronch) Oral Care Recommendations: Oral care QID   Follow up Recommendations        Frequency and Duration min 1 x/week  1 week       Prognosis Prognosis for Safe Diet Advancement: Good      Swallow Study   General   pt is a 58 yo male adm to [facility] with acute onset of dyspnea found to have RLL pna and COPD exacerbation.  Pt PMH + COPD on prednisone, ETOH and marijuana use, former tobacco user, recurrent pneumonias *x5 this year.  Pt on CIWA for ETOH.     Oral/Motor/Sensory Function Overall Oral Motor/Sensory Function: Within functional limits   Ice Chips Ice chips: Not tested   Thin Liquid Thin Liquid: Within functional limits Presentation: Cup Other Comments: pt passed 3 ounce water test without difficulty,  later in session -  he was noted to cough later in the session on liquids and report that he will choke sometimes on liquids    Nectar Thick Nectar Thick Liquid: Not tested   Honey Thick Honey Thick Liquid: Not tested   Puree Presentation: Self Fed;Spoon Other Comments: patient coughing with icecream, reports its due to milk adhered to secretions   Solid     Solid: Within functional limits      Macario Golds 09/12/2018,5:54 PM  Luanna Salk, [facility] SLP [place] Pager [phone] Office [phone]

## 2018-09-12 NOTE — Progress Notes (Signed)
Nutrition Brief Note  RD consulted via COPD protocol.  Patient with overall weight gain. Pt is eating 100% of meals.  Wt Readings from Last 15 Encounters:  09/11/18 85.3 kg  08/28/18 83.9 kg  08/11/18 85.8 kg  08/03/18 85.6 kg  07/31/18 83.4 kg  07/25/18 84.8 kg  07/18/18 85.5 kg  06/20/18 85.3 kg  06/03/18 83.7 kg  05/25/18 88.1 kg  05/17/18 85 kg  03/29/18 84.4 kg  03/14/18 85.8 kg  02/16/18 77.1 kg  07/11/17 74.8 kg    Body mass index is 27.36 kg/m. Patient meets criteria for overweight based on current BMI.   Current diet order is heart healthy , patient is consuming approximately 100% of meals at this time. Labs and medications reviewed.   No nutrition interventions warranted at this time. If nutrition issues arise, please consult RD.   Clayton Bibles, MS, RD, LDN Inpatient Clinical Dietitian Pager: [phone] After Hours Pager: [phone]

## 2018-09-12 NOTE — Progress Notes (Signed)
OT Cancellation Note  Patient Details Name: SULTAN PARGAS MRN: [id number] DOB: 07/08/1960   Cancelled Treatment:    Reason Eval/Treat Not Completed: Other (comment)  Pt working with SLP at time OT checked on pt. Will check back later this day or next day Kari Baars, [place] [phone] Office- [phone], [place] Parkin 09/12/2018, 2:03 PM

## 2018-09-13 LAB — CBC
HCT: 42.5 % (ref 39.0–52.0)
Hemoglobin: 13.9 g/dL (ref 13.0–17.0)
MCH: 32.9 pg (ref 26.0–34.0)
MCHC: 32.7 g/dL (ref 30.0–36.0)
MCV: 100.5 fL — ABNORMAL HIGH (ref 80.0–100.0)
Platelets: 240 10*3/uL (ref 150–400)
RBC: 4.23 MIL/uL (ref 4.22–5.81)
RDW: 12.8 % (ref 11.5–15.5)
WBC: 20.2 10*3/uL — ABNORMAL HIGH (ref 4.0–10.5)
nRBC: 0 % (ref 0.0–0.2)

## 2018-09-13 LAB — LEGIONELLA PNEUMOPHILA SEROGP 1 UR AG: L. pneumophila Serogp 1 Ur Ag: NEGATIVE

## 2018-09-13 LAB — URINE CULTURE: Culture: NO GROWTH

## 2018-09-13 LAB — HIV ANTIBODY (ROUTINE TESTING W REFLEX): HIV Screen 4th Generation wRfx: NONREACTIVE

## 2018-09-13 MED ORDER — AMOXICILLIN-POT CLAVULANATE 875-125 MG PO TABS: 1.0000 | ORAL_TABLET | Freq: Two times a day (BID) | ORAL | 0 refills | Status: AC

## 2018-09-13 MED ORDER — SENNOSIDES-DOCUSATE SODIUM 8.6-50 MG PO TABS: 2.0000 | ORAL_TABLET | Freq: Every evening | ORAL | Status: DC | PRN

## 2018-09-13 MED ORDER — POLYETHYLENE GLYCOL 3350 17 G PO PACK: 17.0000 g | PACK | Freq: Every day | ORAL | Status: DC | PRN

## 2018-09-13 MED ORDER — AZITHROMYCIN 500 MG PO TABS: 500.0000 mg | ORAL_TABLET | Freq: Every day | ORAL | 0 refills | Status: AC

## 2018-09-13 MED FILL — AZITHROMYCIN 500 MG TABLET: 500 | 5 days supply | Qty: 5 | Fill #0

## 2018-09-13 MED FILL — AMOX-CLAV 875-125 MG TABLET: 875-125 | 5 days supply | Qty: 10 | Fill #0

## 2018-09-13 NOTE — Progress Notes (Signed)
James [name] to be D/C'd Home per MD order.  Discussed prescriptions and follow up appointments with the patient. Prescriptions given to patient, medication list explained in detail. Pt verbalized understanding.  Allergies as of 09/13/2018   No Known Allergies     Medication List    STOP taking these medications   guaiFENesin 600 MG 12 hr tablet Commonly known as: MUCINEX     TAKE these medications   AeroChamber MV inhaler Use as instructed   albuterol 108 (90 Base) MCG/ACT inhaler Commonly known as: VENTOLIN HFA Inhale 1-2 puffs into the lungs every 6 (six) hours as needed for wheezing or shortness of breath.   albuterol (2.5 MG/3ML) 0.083% nebulizer solution Commonly known as: PROVENTIL Take 3 mLs (2.5 mg total) by nebulization every 6 (six) hours as needed for wheezing or shortness of breath.   amoxicillin-clavulanate 875-125 MG tablet Commonly known as: AUGMENTIN Take 1 tablet by mouth every 12 (twelve) hours for 5 days.   azithromycin 500 MG tablet Commonly known as: Zithromax Take 1 tablet (500 mg total) by mouth daily for 5 days.   calcium carbonate 500 MG chewable tablet Commonly known as: TUMS - dosed in mg elemental calcium Chew 1 tablet by mouth daily as needed for indigestion or heartburn.   Flutter Devi Use 4 times daily   folic acid 1 MG tablet Commonly known as: FOLVITE Take 1 tablet (1 mg total) by mouth daily.   Incruse Ellipta 62.5 MCG/INH Aepb Generic drug: umeclidinium bromide Inhale 1 puff into the lungs daily.   losartan 100 MG tablet Commonly known as: Cozaar Take 1 tablet (100 mg total) by mouth daily.   mometasone-formoterol 200-5 MCG/ACT Aero Commonly known as: DULERA Inhale 2 puffs into the lungs 2 (two) times daily.   predniSONE 20 MG tablet Commonly known as: DELTASONE Take 20 mg by mouth daily with breakfast.   thiamine 100 MG tablet Commonly known as: VITAMIN B-1 Take 1 tablet (100 mg total) by mouth daily.        Vitals:   09/13/18 0605 09/13/18 0815  BP: (!) 146/96   Pulse: 72   Resp: 18   Temp: 98.3 F (36.8 C)   SpO2: 96% 92%    Skin clean, dry and intact without evidence of skin break down, no evidence of skin tears noted. IV catheter discontinued intact. Site without signs and symptoms of complications. Dressing and pressure applied. Pt denies pain at this time. No complaints noted.  An After Visit Summary was printed and given to the patient. Patient escorted via [place], and D/C home via private auto.  James [name] 09/13/2018 10:38 AM

## 2018-09-13 NOTE — Progress Notes (Signed)
SATURATION QUALIFICATIONS: (This note is used to comply with regulatory documentation for home oxygen)  Patient Saturations on Room Air at Rest = 98%  Patient Saturations on Room Air while Ambulating = 94%   Please briefly explain why patient needs home oxygen: Patient does not qualify for home oxygen use at this time.

## 2018-09-13 NOTE — Plan of Care (Signed)

## 2018-09-13 NOTE — Discharge Summary (Signed)
Physician Discharge Summary  James [name] IRJ:[id number] DOB: 1960/04/24 DOA: 09/11/2018  PCP: Scot Jun, FNP  Admit date: 09/11/2018 Discharge date: 09/13/2018  Admitted From: Home Disposition: Home  Recommendations for Outpatient Follow-up:  1. Follow up with PCP in 1-2 weeks 2. Please obtain BMP/CBC in one week your next doctors visit.  3. Augmentin and azithromycin for 5 days 4. Advised to continue using bronchodilators every 4-6 hours for the next 4-5 days and then transition to as needed. 5. Advised to take his home prednisone, for next 3 days advised to increase it to 40 mg twice daily then resume back is 20 mg daily  Discharge Condition: Stable CODE STATUS: Full code Diet recommendation: 2 g salt  Brief/Interim Summary: James [name] is a 58 y.o. malewith history ofCOPD on prednisone, HTN, alcohol use disorder, marijuana use,tobacco user. Patient presented secondary to dyspnea and found to have evidence of pneumonia and a COPD exacerbation in addition to sepsis physiology on admission.  There is concerns of possible right lower lobe pneumonia therefore started on Unasyn and azithromycin due to concerns of aspiration.  This was transitioned to Augmentin and azithromycin for total of 5 days at time of discharge.  Secondary to infection, he was having COPD exacerbation therefore on bronchodilators.  He started improving and on the day of discharge he was adamant about going home as he was feeling much better.  He was still having abnormal breath sounds and cough therefore I had requested him to stay another day in the hospital but still wanted to go home.  Instructions were given to him take 40 mg of prednisone for next 3 days and then he can resume back his 20 mg daily.  Advised. home bronchodilators periodically every 4-6 hours for next 4-5 days and then transition to as needed. Advised to follow-up with his PCP in about 1 week.   Discharge Diagnoses:  Active Problems:    Hypertension   COPD exacerbation ([place])   Marijuana abuse   Pneumonia   Sepsis ([place])  Acute respiratory distress secondary to right lower lobe pneumonia, community-acquired/aspiration Acute mild exacerbation of COPD Concern for possible aspiration.  Unasyn and azithromycin transition to Augmentin and azithromycin.  We will give him 5 more day course at the time of discharge 40 mg of prednisone for next 3 days then transition to 20 mg daily which is his home dose Advised to use bronchodilators.3  Alcohol use disorder Counseled to quit drinking alcohol.  Advised to continue thiamine, folate and multivitamin  Weakness -He did well with physical therapy.  Tobacco use Marijuana use Cessation discussed on admission  Consultations:  None  Subjective: Still having some cough and congestion but he is adamant about going home today as he is feeling much better than from admission.  I recommended he stay in the hospital for at least 1 more day to help him feel even better.  He wished to go home. Ambulating in the hallway without additional saturating greater than 90% on room air.  Discharge Exam: Vitals:   09/13/18 0605 09/13/18 0815  BP: (!) 146/96   Pulse: 72   Resp: 18   Temp: 98.3 F (36.8 C)   SpO2: 96% 92%   Vitals:   09/12/18 2012 09/12/18 2125 09/13/18 0605 09/13/18 0815  BP: (!) 154/93  (!) 146/96   Pulse: 87  72   Resp: 16  18   Temp: 97.9 F (36.6 C)  98.3 F (36.8 C)   TempSrc: Oral  SpO2: 94% 94% 96% 92%  Weight:      Height:        General: Pt is alert, awake, not in acute distress Cardiovascular: RRR, S1/S2 +, no rubs, no gallops Respiratory: CTA bilaterally, no wheezing, no rhonchi Abdominal: Soft, NT, ND, bowel sounds + Extremities: no edema, no cyanosis  Discharge Instructions   Allergies as of 09/13/2018   No Known Allergies     Medication List    STOP taking these medications   guaiFENesin 600 MG 12 hr tablet Commonly known as:  MUCINEX     TAKE these medications   AeroChamber MV inhaler Use as instructed   albuterol 108 (90 Base) MCG/ACT inhaler Commonly known as: VENTOLIN HFA Inhale 1-2 puffs into the lungs every 6 (six) hours as needed for wheezing or shortness of breath.   albuterol (2.5 MG/3ML) 0.083% nebulizer solution Commonly known as: PROVENTIL Take 3 mLs (2.5 mg total) by nebulization every 6 (six) hours as needed for wheezing or shortness of breath.   amoxicillin-clavulanate 875-125 MG tablet Commonly known as: AUGMENTIN Take 1 tablet by mouth every 12 (twelve) hours for 5 days.   azithromycin 500 MG tablet Commonly known as: Zithromax Take 1 tablet (500 mg total) by mouth daily for 5 days.   calcium carbonate 500 MG chewable tablet Commonly known as: TUMS - dosed in mg elemental calcium Chew 1 tablet by mouth daily as needed for indigestion or heartburn.   Flutter Devi Use 4 times daily   folic acid 1 MG tablet Commonly known as: FOLVITE Take 1 tablet (1 mg total) by mouth daily.   Incruse Ellipta 62.5 MCG/INH Aepb Generic drug: umeclidinium bromide Inhale 1 puff into the lungs daily.   losartan 100 MG tablet Commonly known as: Cozaar Take 1 tablet (100 mg total) by mouth daily.   mometasone-formoterol 200-5 MCG/ACT Aero Commonly known as: DULERA Inhale 2 puffs into the lungs 2 (two) times daily.   predniSONE 20 MG tablet Commonly known as: DELTASONE Take 20 mg by mouth daily with breakfast.   thiamine 100 MG tablet Commonly known as: VITAMIN B-1 Take 1 tablet (100 mg total) by mouth daily.      Follow-up Information    Scot Jun, FNP. Schedule an appointment as soon as possible for a visit in 1 week(s).   Specialty: Family Medicine Contact information: [address] [place] [address] [phone]        Geralynn Rile, MD .   Specialties: Internal Medicine, Cardiology Contact information: [place]  [address] [phone]          No Known Allergies  You were cared for by a hospitalist during your hospital stay. If you have any questions about your discharge medications or the care you received while you were in the hospital after you are discharged, you can call the unit and asked to speak with the hospitalist on call if the hospitalist that took care of you is not available. Once you are discharged, your primary care physician will handle any further medical issues. Please note that no refills for any discharge medications will be authorized once you are discharged, as it is imperative that you return to your primary care physician (or establish a relationship with a primary care physician if you do not have one) for your aftercare needs so that they can reassess your need for medications and monitor your lab values.   Procedures/Studies: Dg Chest Port 1 View  Result  Date: 09/11/2018 CLINICAL DATA:  Shortness of breath. EXAM: PORTABLE CHEST 1 VIEW COMPARISON:  08/10/2018 FINDINGS: Normal heart size. No pleural effusion or edema identified. New asymmetric airspace consolidation in the right lung base. Left lung clear. IMPRESSION: 1. Asymmetric opacity in the right lung base is new from previous exam concerning for either pneumonia or aspiration. Electronically Signed   By: Kerby Moors M.D.   On: 09/11/2018 11:21      The results of significant diagnostics from this hospitalization (including imaging, microbiology, ancillary and laboratory) are listed below for reference.     Microbiology: Recent Results (from the past 240 hour(s))  SARS Coronavirus 2 [facility] order, Performed in [facility] hospital lab) Nasopharyngeal Nasopharyngeal Swab     Status: None   Collection Time: 09/11/18 10:49 AM   Specimen: Nasopharyngeal Swab  Result Value Ref Range Status   SARS Coronavirus 2 NEGATIVE NEGATIVE Final    Comment: (NOTE) If result is NEGATIVE SARS-CoV-2 target nucleic acids are NOT  DETECTED. The SARS-CoV-2 RNA is generally detectable in upper and lower  respiratory specimens during the acute phase of infection. The lowest  concentration of SARS-CoV-2 viral copies this assay can detect is 250  copies / mL. A negative result does not preclude SARS-CoV-2 infection  and should not be used as the sole basis for treatment or other  patient management decisions.  A negative result may occur with  improper specimen collection / handling, submission of specimen other  than nasopharyngeal swab, presence of viral mutation(s) within the  areas targeted by this assay, and inadequate number of viral copies  (<250 copies / mL). A negative result must be combined with clinical  observations, patient history, and epidemiological information. If result is POSITIVE SARS-CoV-2 target nucleic acids are DETECTED. The SARS-CoV-2 RNA is generally detectable in upper and lower  respiratory specimens dur ing the acute phase of infection.  Positive  results are indicative of active infection with SARS-CoV-2.  Clinical  correlation with patient history and other diagnostic information is  necessary to determine patient infection status.  Positive results do  not rule out bacterial infection or co-infection with other viruses. If result is PRESUMPTIVE POSTIVE SARS-CoV-2 nucleic acids MAY BE PRESENT.   A presumptive positive result was obtained on the submitted specimen  and confirmed on repeat testing.  While 2019 novel coronavirus  (SARS-CoV-2) nucleic acids may be present in the submitted sample  additional confirmatory testing may be necessary for epidemiological  and / or clinical management purposes  to differentiate between  SARS-CoV-2 and other Sarbecovirus currently known to infect humans.  If clinically indicated additional testing with an alternate test  methodology [phone]) is advised. The SARS-CoV-2 RNA is generally  detectable in upper and lower respiratory sp ecimens during  the acute  phase of infection. The expected result is Negative. Fact Sheet for Patients:  StrictlyIdeas.no Fact Sheet for Healthcare Providers: BankingDealers.co.za This test is not yet approved or cleared by the [place] FDA and has been authorized for detection and/or diagnosis of SARS-CoV-2 by FDA under an Emergency Use Authorization (EUA).  This EUA will remain in effect (meaning this test can be used) for the duration of the COVID-19 declaration under Section 564(b)(1) of the Act, 21 U.S.C. section 360bbb-3(b)(1), unless the authorization is terminated or revoked sooner. Performed at [facility], [place] [address], [place], [place] [address]   Blood Culture (routine x 2)     Status: None (Preliminary result)   Collection Time: 09/11/18  11:34 AM   Specimen: Right Antecubital; Blood  Result Value Ref Range Status   Specimen Description   Final    RIGHT ANTECUBITAL Performed at [facility], [place] [address], [place], [place] [address]    Special Requests   Final    BOTTLES DRAWN AEROBIC AND ANAEROBIC Blood Culture adequate volume Performed at [place] [address], [place], [place] [address]    Culture   Final    NO GROWTH 2 DAYS Performed at [place] [address] [place], [place] [address]    Report Status PENDING  Incomplete  Blood Culture (routine x 2)     Status: None (Preliminary result)   Collection Time: 09/11/18 11:39 AM   Specimen: Left Antecubital; Blood  Result Value Ref Range Status   Specimen Description   Final    LEFT ANTECUBITAL Performed at [facility] [address] [place], [place] [address]    Special Requests   Final    BOTTLES DRAWN AEROBIC AND ANAEROBIC Blood Culture adequate volume Performed at [place] [address], [place], [place] [address]    Culture   Final    NO GROWTH  2 DAYS Performed at [place] [address], [place], [place] [address]    Report Status PENDING  Incomplete  MRSA PCR Screening     Status: None   Collection Time: 09/11/18  7:50 PM   Specimen: Nasal Mucosa; Nasopharyngeal  Result Value Ref Range Status   MRSA by PCR NEGATIVE NEGATIVE Final    Comment:        The GeneXpert MRSA Assay (FDA approved for NASAL specimens only), is one component of a comprehensive MRSA colonization surveillance program. It is not intended to diagnose MRSA infection nor to guide or monitor treatment for MRSA infections. Performed at [facility], [place] [address] [place], [place] [address]   Urine culture     Status: None   Collection Time: 09/12/18  4:15 AM   Specimen: Urine, Random  Result Value Ref Range Status   Specimen Description   Final    URINE, RANDOM Performed at [place] [address], [place], [place] [address]    Special Requests   Final    NONE Performed at [facility], [place] [address], [place], [place] [address]    Culture   Final    NO GROWTH Performed at [facility], [place] [address], [place], [place] [address]    Report Status 09/13/2018 FINAL  Final     Labs: BNP (last 3 results) Recent Labs    06/02/18 0936 07/31/18 0240 09/11/18 1100  BNP 38.8 71.0 12.4   Basic Metabolic Panel: Recent Labs  Lab 09/11/18 1100 09/12/18 0439  NA 140 138  K 4.3 4.3  CL 103 106  CO2 27 24  GLUCOSE 139* 121*  BUN 18 18  CREATININE 0.87 0.61  CALCIUM 9.0 8.4*   Liver Function Tests: No results for input(s): AST, ALT, ALKPHOS, BILITOT, PROT, ALBUMIN in the last 168 hours. No results for input(s): LIPASE, AMYLASE in the last 168 hours. No results for input(s): AMMONIA in the last 168 hours. CBC: Recent Labs  Lab 09/11/18 1100 09/12/18 0439 09/13/18 0516  WBC 26.1* 19.6* 20.2*  NEUTROABS 22.9*  --   --   HGB 14.7 13.8 13.9  HCT 44.7 43.1  42.5  MCV 99.1 102.9* 100.5*  PLT  239 225 240   Cardiac Enzymes: No results for input(s): CKTOTAL, CKMB, CKMBINDEX, TROPONINI in the last 168 hours. BNP: Invalid input(s): POCBNP CBG: No results for input(s): GLUCAP in the last 168 hours. D-Dimer Recent Labs    09/12/18 0439  DDIMER 0.43   Hgb A1c No results for input(s): HGBA1C in the last 72 hours. Lipid Profile No results for input(s): CHOL, HDL, LDLCALC, TRIG, CHOLHDL, LDLDIRECT in the last 72 hours. Thyroid function studies No results for input(s): TSH, T4TOTAL, T3FREE, THYROIDAB in the last 72 hours.  Invalid input(s): FREET3 Anemia work up No results for input(s): VITAMINB12, FOLATE, FERRITIN, TIBC, IRON, RETICCTPCT in the last 72 hours. Urinalysis    Component Value Date/Time   COLORURINE YELLOW 09/12/2018 [place] 09/12/2018 0415   LABSPEC 1.015 09/12/2018 0415   PHURINE 5.0 09/12/2018 0415   GLUCOSEU >=500 (A) 09/12/2018 0415   HGBUR NEGATIVE 09/12/2018 0415   BILIRUBINUR NEGATIVE 09/12/2018 0415   KETONESUR NEGATIVE 09/12/2018 0415   PROTEINUR NEGATIVE 09/12/2018 0415   UROBILINOGEN 4.0 (H) 06/15/2013 1030   NITRITE NEGATIVE 09/12/2018 0415   LEUKOCYTESUR NEGATIVE 09/12/2018 0415   Sepsis Labs Invalid input(s): PROCALCITONIN,  WBC,  LACTICIDVEN Microbiology Recent Results (from the past 240 hour(s))  SARS Coronavirus 2 [facility] order, Performed in [facility] hospital lab) Nasopharyngeal Nasopharyngeal Swab     Status: None   Collection Time: 09/11/18 10:49 AM   Specimen: Nasopharyngeal Swab  Result Value Ref Range Status   SARS Coronavirus 2 NEGATIVE NEGATIVE Final    Comment: (NOTE) If result is NEGATIVE SARS-CoV-2 target nucleic acids are NOT DETECTED. The SARS-CoV-2 RNA is generally detectable in upper and lower  respiratory specimens during the acute phase of infection. The lowest  concentration of SARS-CoV-2 viral copies this assay can detect is 250  copies / mL. A negative  result does not preclude SARS-CoV-2 infection  and should not be used as the sole basis for treatment or other  patient management decisions.  A negative result may occur with  improper specimen collection / handling, submission of specimen other  than nasopharyngeal swab, presence of viral mutation(s) within the  areas targeted by this assay, and inadequate number of viral copies  (<250 copies / mL). A negative result must be combined with clinical  observations, patient history, and epidemiological information. If result is POSITIVE SARS-CoV-2 target nucleic acids are DETECTED. The SARS-CoV-2 RNA is generally detectable in upper and lower  respiratory specimens dur ing the acute phase of infection.  Positive  results are indicative of active infection with SARS-CoV-2.  Clinical  correlation with patient history and other diagnostic information is  necessary to determine patient infection status.  Positive results do  not rule out bacterial infection or co-infection with other viruses. If result is PRESUMPTIVE POSTIVE SARS-CoV-2 nucleic acids MAY BE PRESENT.   A presumptive positive result was obtained on the submitted specimen  and confirmed on repeat testing.  While 2019 novel coronavirus  (SARS-CoV-2) nucleic acids may be present in the submitted sample  additional confirmatory testing may be necessary for epidemiological  and / or clinical management purposes  to differentiate between  SARS-CoV-2 and other Sarbecovirus currently known to infect humans.  If clinically indicated additional testing with an alternate test  methodology [phone]) is advised. The SARS-CoV-2 RNA is generally  detectable in upper and lower respiratory sp ecimens during the acute  phase of infection. The expected result is Negative. Fact Sheet for Patients:  StrictlyIdeas.no Fact Sheet  for Healthcare Providers: BankingDealers.co.za This test is not yet  approved or cleared by the [place] and has been authorized for detection and/or diagnosis of SARS-CoV-2 by FDA under an Emergency Use Authorization (EUA).  This EUA will remain in effect (meaning this test can be used) for the duration of the COVID-19 declaration under Section 564(b)(1) of the Act, 21 U.S.C. section 360bbb-3(b)(1), unless the authorization is terminated or revoked sooner. Performed at [facility], [place] [address] [place], [place] [address]   Blood Culture (routine x 2)     Status: None (Preliminary result)   Collection Time: 09/11/18 11:34 AM   Specimen: Right Antecubital; Blood  Result Value Ref Range Status   Specimen Description   Final    RIGHT ANTECUBITAL Performed at [place] [address], [place], [place] [address]    Special Requests   Final    BOTTLES DRAWN AEROBIC AND ANAEROBIC Blood Culture adequate volume Performed at [place] [address], [place], [place] [address]    Culture   Final    NO GROWTH 2 DAYS Performed at [place] [address], [place], [place] [address]    Report Status PENDING  Incomplete  Blood Culture (routine x 2)     Status: None (Preliminary result)   Collection Time: 09/11/18 11:39 AM   Specimen: Left Antecubital; Blood  Result Value Ref Range Status   Specimen Description   Final    LEFT ANTECUBITAL Performed at [place] [address] [place], [place] [address]    Special Requests   Final    BOTTLES DRAWN AEROBIC AND ANAEROBIC Blood Culture adequate volume Performed at [place] [address] [place], [place] [address]    Culture   Final    NO GROWTH 2 DAYS Performed at [place] [address], [place], [place] [address]    Report Status PENDING  Incomplete  MRSA PCR Screening     Status: None   Collection Time: 09/11/18  7:50 PM   Specimen: Nasal Mucosa;  Nasopharyngeal  Result Value Ref Range Status   MRSA by PCR NEGATIVE NEGATIVE Final    Comment:        The GeneXpert MRSA Assay (FDA approved for NASAL specimens only), is one component of a comprehensive MRSA colonization surveillance program. It is not intended to diagnose MRSA infection nor to guide or monitor treatment for MRSA infections. Performed at [facility], [place] [address], [place], [place] [address]   Urine culture     Status: None   Collection Time: 09/12/18  4:15 AM   Specimen: Urine, Random  Result Value Ref Range Status   Specimen Description   Final    URINE, RANDOM Performed at [place] [address] [place], [place] [address]    Special Requests   Final    NONE Performed at [facility], [place] [address] [place], [place] [address]    Culture   Final    NO GROWTH Performed at [facility], [place] [address] [place],  [address]    Report Status 09/13/2018 FINAL  Final     Time coordinating discharge:  I have spent 35 minutes face to face with the patient and on the ward discussing the patients care, assessment, plan and disposition with other care givers. >50% of the time was devoted counseling the patient about the risks  and benefits of treatment/Discharge disposition and coordinating care.   SIGNED:   Damita Lack, MD  [facility] 09/13/2018, 11:44 AM   If 7PM-7AM, please contact night-coverage [email or web address]

## 2018-09-13 NOTE — Evaluation (Signed)
Occupational Therapy Evaluation Patient Details Name: James [name] MRN: [id number] DOB: 01-Aug-1960 Today's Date: 09/13/2018    History of Present Illness 58 yo male admitted with Pna, sepsis. hx of COPD, alcoholism, marijuana use   Clinical Impression   Pt was admitted for the above. At baseline, he lives alone and is independent with adls/iadls.  Pt currently needs min guard to supervision. He did not have a LOB, but he scissored feet at times when walking.  Pt will not qualify for follow up therapy.    Follow Up Recommendations  No OT follow up    Equipment Recommendations  None recommended by OT    Recommendations for Other Services       Precautions / Restrictions Precautions Precautions: Fall Restrictions Weight Bearing Restrictions: No      Mobility Bed Mobility Overal bed mobility: Independent                Transfers       Sit to Stand: Supervision              Balance                                           ADL either performed or assessed with clinical judgement   ADL Overall ADL's : Needs assistance/impaired                                       General ADL Comments: pt is at a supervision to min guard level for gathering items for adls.  He tends to scissor feet at times, but did not have any LOB.  Talked about safety strategies at home, especially when showering (in tub)     Vision         Perception     Praxis      Pertinent Vitals/Pain Pain Assessment: No/denies pain     Hand Dominance Right   Extremity/Trunk Assessment             Communication Communication Communication: No difficulties   Cognition Arousal/Alertness: Awake/alert Behavior During Therapy: WFL for tasks assessed/performed Overall Cognitive Status: Within Functional Limits for tasks assessed                                     General Comments       Exercises     Shoulder Instructions       Home Living Family/patient expects to be discharged to:: Private residence Living Arrangements: Alone Available Help at Discharge: Family;Friend(s);Available PRN/intermittently Type of Home: House             Bathroom Shower/Tub: Teacher, early years/pre: Standard     Home Equipment: None          Prior Functioning/Environment Level of Independence: Independent                 OT Problem List:        OT Treatment/Interventions:      OT Goals(Current goals can be found in the care plan section) Acute Rehab OT Goals Patient Stated Goal: home OT Goal Formulation: All assessment and education complete, DC therapy  OT Frequency:     Barriers to D/C:  Co-evaluation              AM-PAC OT "6 Clicks" Daily Activity     Outcome Measure Help from another person eating meals?: None Help from another person taking care of personal grooming?: A Little Help from another person toileting, which includes using toliet, bedpan, or urinal?: A Little Help from another person bathing (including washing, rinsing, drying)?: A Little Help from another person to put on and taking off regular upper body clothing?: A Little Help from another person to put on and taking off regular lower body clothing?: A Little 6 Click Score: 19   End of Session    Activity Tolerance: No increased pain Patient left: in bed;with call bell/phone within reach;with bed alarm set  OT Visit Diagnosis: Muscle weakness (generalized) (M62.81)                Time: 9324-1991 OT Time Calculation (min): 13 min Charges:  OT General Charges $OT Visit: 1 Visit OT Evaluation $OT Eval Low Complexity: [place], OTR/L Acute Rehabilitation Services [phone] [facility] pager [phone] office 09/13/2018  Alessandria Henken 09/13/2018, 12:04 PM

## 2018-09-14 ENCOUNTER — Telehealth: Payer: Self-pay

## 2018-09-14 ENCOUNTER — Telehealth: Payer: Self-pay | Admitting: Family Medicine

## 2018-09-14 NOTE — Progress Notes (Deleted)
Cardiology Office Note:   Date:  09/14/2018  NAME:  James [name]    MRN: [id number] DOB:  [birth year]   PCP:  Scot Jun, FNP  Cardiologist:  Evalina Field, MD  Electrophysiologist:  None   Referring MD: Scot Jun, FNP   No chief complaint on file. ***  History of Present Illness:   James [name] is a 58 y.o. male with a hx of COPD, recurrent PNA (several admissions this year) who is being seen today for the evaluation of chest pain at the request of Scot Jun, FNP.  Past Medical History: Past Medical History:  Diagnosis Date  . Acute on chronic respiratory failure with hypoxia ([place]) 06/08/2013  . COPD (chronic obstructive pulmonary disease) ([name])    not on home  . Dyspnea   . Hypertension   . Pneumonia 04/06/2016  . Pneumothorax    2016, fell from ladder  . Tick bite 06/03/2018    Past Surgical History: Past Surgical History:  Procedure Laterality Date  . VIDEO ASSISTED THORACOSCOPY (VATS)/THOROCOTOMY Left 06/11/2013   Procedure: VIDEO ASSISTED THORACOSCOPY (VATS)/THOROCOTOMY;  Surgeon: Ivin Poot, MD;  Location: [place] [facility];  Service: Thoracic;  Laterality: Left;  [name] VIDEO BRONCHOSCOPY N/A 06/11/2013   Procedure: VIDEO BRONCHOSCOPY;  Surgeon: Ivin Poot, MD;  Location: [place] [facility];  Service: Thoracic;  Laterality: N/A;  . VIDEO BRONCHOSCOPY N/A 08/10/2018   Procedure: VIDEO BRONCHOSCOPY WITH FLUORO;  Surgeon: Candee Furbish, MD;  Location: [facility] ENDOSCOPY;  Service: Endoscopy;  Laterality: N/A;    Current Medications: No outpatient medications have been marked as taking for the 09/15/18 encounter (Appointment) with O'Neal, Cassie Freer, MD.     Allergies:    Patient has no known allergies.   Social History: Social History   Socioeconomic History  . Marital status: Divorced    Spouse name: Not on file  . Number of children: Not on file  . Years of education: Not on file  . Highest education level: Not on file  Occupational History  .  Occupation: Dealer  . Occupation: [employer]  . Financial resource strain: Hard  . Food insecurity    Worry: Not on file    Inability: Never true  . Transportation needs    Medical: No    Non-medical: No  Tobacco Use  . Smoking status: Former Smoker    Packs/day: 1.50    Years: 40.00    Pack years: 60.00    Types: Cigarettes    Quit date: 2014    Years since quitting: 6.6  . Smokeless tobacco: Current User    Types: Chew  Substance and Sexual Activity  . Alcohol use: Yes    Alcohol/week: 28.0 standard drinks    Types: 28 Cans of beer per week    Comment: 4 beers a night; + jitters with not drinking, denies DTs or seizures  . Drug use: Yes    Types: Marijuana    Comment: daily; quit using crack and methamphetamine about 5 months ago   . Sexual activity: Not Currently    Partners: Female  Lifestyle  . Physical activity    Days per week: 0 days    Minutes per session: 0 min  . Stress: To some extent  Relationships  . Social Herbalist on phone: Three times a week    Gets together: Once a week    Attends religious service: 1 to 4 times per year  Active member of club or organization: Yes    Attends meetings of clubs or organizations: 1 to 4 times per year    Relationship status: Divorced  Other Topics Concern  . Not on file  Social History Narrative   Lives alone near [place]     Family History: The patient's ***family history includes COPD in his sister; Heart disease in his father.  ROS:   All other ROS reviewed and negative. Pertinent positives noted in the HPI.     EKGs/Labs/Other Studies Reviewed:   The following studies were personally reviewed by me today:  EKG:  EKG is *** ordered today.  The ekg ordered today demonstrates ***, and was personally reviewed by me.   Recent Labs: 03/14/2018: TSH 0.925 06/02/2018: Magnesium 1.6 06/20/2018: ALT 37 09/11/2018: B Natriuretic Peptide 30.1 09/12/2018: BUN 18; Creatinine, Ser 0.61;  Potassium 4.3; Sodium 138 09/13/2018: Hemoglobin 13.9; Platelets 240   Recent Lipid Panel    Component Value Date/Time   TRIG 52 05/17/2018 0902    Physical Exam:   VS:  There were no vitals taken for this visit.   Wt Readings from Last 3 Encounters:  09/11/18 188 lb (85.3 kg)  08/28/18 185 lb (83.9 kg)  08/11/18 189 lb 1.6 oz (85.8 kg)    General: Well nourished, well developed, in no acute distress Heart: Atraumatic, normal size  Eyes: PEERLA, EOMI  Neck: Supple, no JVD Endocrine: No thryomegaly Cardiac: Normal S1, S2; RRR; no murmurs, rubs, or gallops Lungs: Clear to auscultation bilaterally, no wheezing, rhonchi or rales  Abd: Soft, nontender, no hepatomegaly  Ext: No edema, pulses 2+ Musculoskeletal: No deformities, BUE and BLE strength normal and equal Skin: Warm and dry, no rashes   Neuro: Alert and oriented to person, place, time, and situation, CNII-XII grossly intact, no focal deficits  Psych: Normal mood and affect   ASSESSMENT:   NAME@ is a 58 y.o. male who presents for the following: No diagnosis found.  PLAN:   There are no diagnoses linked to this encounter.  Disposition: No follow-ups on file.  Medication Adjustments/Labs and Tests Ordered: Current medicines are reviewed at length with the patient today.  Concerns regarding medicines are outlined above.  No orders of the defined types were placed in this encounter.  No orders of the defined types were placed in this encounter.   There are no Patient Instructions on file for this visit.   Signed, Addison Naegeli. Audie Box, [place]  [address], [place], [place] [address] [phone]  09/14/2018 5:48 PM

## 2018-09-14 NOTE — Telephone Encounter (Signed)
Transition Care Management Follow-up Telephone Call Date of discharge and from where:09/13/2018, [facility].  Call placed to patient's sister, [name], # [phone], message left requesting a call back to this CM # [phone]

## 2018-09-15 ENCOUNTER — Telehealth: Payer: Self-pay

## 2018-09-15 ENCOUNTER — Ambulatory Visit: Payer: Self-pay | Admitting: Cardiovascular Disease

## 2018-09-15 NOTE — Telephone Encounter (Signed)
Transition Care Management Follow-up Telephone Call  Call completed with patient's sister, [name].     Date of discharge and from where: 09/13/2018, [facility]  How have you been since you were released from the hospital? His sister said that he is coming to see her tonight and she will see how he is doing.    Any questions or concerns? Her concerns were regarding his general health and questionable compliance with medications and healthy lifestyle recommendations.   She also reported concerns about his home environment - the house is missing parts of the floor, there is mold.  He is staying in a rental property that is owned by a family acquaintance.  The patient lives in the home and the landlord gives patient a weekly stipend as well as pay his utilities. However, the landlord requires that the patient work for him and the patient should not be working.  [name] did not want to involve [employer] [place] or DSS, noting how upset the landlord would become.   After working daily, the patient told her that he drinks 4 - 20oz beer/night.  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 She is not sure that he has mentioned this to any providers.   Items Reviewed:  Did the pt receive and understand the discharge instructions provided? [name] does not have the instructions. She said that her brother is supposed to bring them to her tonight.   Medications obtained and verified? [name] said that she had her brother pick up the new antibiotics at the pharmacy and he said that he did. She said that she instructed him to bring all of his medications and the instructions to her when he comes to see her tonight and she will review everything.  She said that he tells her that he is taking the medications as instructed.  He has a system that he uses as he is not abe to read/spell/ add as per [name].   She said that he does not have any prednisone and is not sure that he is  supposed to be on it. He needs a new prescription.  It's listed on the AVS to take 20 mg daily. The provider's discharge summary notes patient advised to take home prednisone, for next 3 days to increase to 40 mg twice daily and then resume 20 mg daily. Informed her that the provider would be notified of this question.   Any new allergies since your discharge? None reported   Do you have support at home? He lives alone as noted above and has been living under the care of this landlord since he was 58 years old.  [name] lives about 1 mile away and is his primary support. However, she said that right now she is very frustrated with him because of his non -compliance.   Other (ie: DME, Home Health, etc) no home health ordered.   His sister has a BP monitor and oximeter  Has has an aerochamber and nebulizer.  He has no insurance. Sister is waiting for certified information from [facility] records that she needs to complete application for replacement social security card. She then will assist him with applying for disability, medicaid and orange card/[facility] Financial assistance as needed.   Functional Questionnaire: (I = Independent and D = Dependent) ADL's: independent , sister assists with medication management.    Follow up appointments reviewed:    PCP Hospital f/u appt confirmed? 09/21/2018 @  1500 at [facility].   [facility] f/u appt confirmed? He missed cardiology appointment today.  [name] said that she forgot about it and will need to re-schedule.  She had scheduled today's appointment,   Are transportation arrangements needed? [name] to provide transportation.   If their condition worsens, is the pt aware to call  their PCP or go to the ED? Yes   Was the patient provided with contact information for the PCP's office or ED? [name] has the clinic number  Was the pt encouraged to call back with questions or concerns? Yes [name] was encouraged to call.

## 2018-09-16 LAB — CULTURE, BLOOD (ROUTINE X 2)
Culture: NO GROWTH
Culture: NO GROWTH
Special Requests: ADEQUATE
Special Requests: ADEQUATE

## 2018-09-18 MED ORDER — PREDNISONE 10 MG PO TABS: ORAL_TABLET | ORAL | 0 refills | Status: DC

## 2018-09-18 MED FILL — predniSONE 10 MG TABS: 10 | 5 days supply | Qty: 20 | Fill #0

## 2018-09-18 NOTE — Progress Notes (Deleted)
Subjective:    Patient ID: James [name], male    DOB: 06-Aug-1960, 58 y.o.   MRN: [id number]  58 y.o.M with advanced Copd   I saw this patient a week ago and prescribed antibiotics and prednisone for what I perceived to be persisting pneumonia.  The patient had a slight improvement but then worsened and came to the emergency room the end of the week and was admitted for 3 days for COPD exacerbation and lower lobe pneumonia.  Chest x-ray did show persistent infiltrates in the lower lobes.  The patient is now discharged and returns to the office for post hospital follow-up.  Below is the discharge summary Dc summary : Admit date: 07/31/2018 Discharge date: 08/01/2018  Time spent: 45 minutes  Recommendations for Outpatient Follow-up:  Patient will be discharged to home.  Patient will need to follow up with primary care provider within one week of discharge.  Patient should continue medications as prescribed.  Patient should follow a heart healthy diet.   Discharge Diagnoses:  Acute on chronic respiratory failure with hypoxia secondary to COPD Exacerbation and pneumonia Essential hypertension  Discharge Condition: Stable  Diet recommendation: heart healthy  Filed Weights  07/31/18 0231 07/31/18 0602 Weight: 85.3 kg 83.4 kg   History of present illness:  on 07/31/2018 by James [name] a 58 y.o.malewith medical history significant ofCOPD. Patient has had 3 prior admits for COPD / PNA thus far this year, most recently in May.  Patient has been being treated for COPD exacerbation and BLL CAP as outpatient by Dr. Joya [name] with levaquin and prednisone. Just finished levaquin yesterday. Prednisone remained at 40mg  daily with plans to initiate a slow taper.  Despite this, patient developed severe SOB and respiratory distress this evening. EMS called and patient noted to be satting 80% on RA. He is not on chronic O2 for his COPD.  Hospital Course:   Acute on chronic respiratory failure with hypoxia secondary to COPD Exacerbation and pneumonia -Patient noted to have oxygen saturations in the 80s on room air -Chest x-ray noted for pneumonia -Was recently placed on Levaquin and steroids by PCP approximately 1 week ago -On admission, patient continued to have wheezing well into his hospital stay day 1.  Patient currently still has some wheezing however has improved. -Was placed on Solu-Medrol, along with nebulizer treatments, azithromycin and ceftriaxone -Patient did require oxygen upon admission however has been able to ambulate and maintain oxygen saturations in the high 90s on room air. -Will discharge patient with prednisone taper, azithromycin and Ceftin. -Given that patient had oxygen saturations in the 80s on admission, it was thought that he would require several days in the hospital. Patient actually improved quicker than expected.  Essential hypertension -Continue losartan  Since discharge the patient is tried to go back to work but has extreme difficulty in the heat with this.  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  09/18/2018  Readmitted twice 08/2018 and  09/2018  Admit date: 08/09/2018 Discharge date: 08/11/2018  Time spent: 45 minutes  Recommendations for Outpatient Follow-up:  Patient will be discharged to home.  Patient will  need to follow up with primary care provider within one week of discharge.  Follow up with pulmonology. Patient should continue medications as prescribed.  Patient should follow a heart healthy diet.   Discharge Diagnoses:  Acute on chronic hypoxic respiratory failure/sepsis  due to multifocal pneumonia COPD Alcohol/marijuana use Essential hypertension  Discharge Condition: Stable  Diet recommendation: heart healthy  Filed Weights  08/10/18 0703 08/11/18 0228 08/11/18 0706 Weight: 84.2 kg 85.6 kg 85.8 kg   History of present illness:  on 08/09/2018 by Dr. [name] a 58 y.o.malewith medical history significant ofCOPD and ETOH dependence presenting with respiratory distress.He woke up about 530 this AM and couldn't breathe. This just keeps happening. He felt similarly when hospitalized last week. He has been doing fine until today. No h/o OSA, not on CPAP. Not on home O2. +cough, productive of yellowish sputum. It is sometimes hard to get the sputum up. No fever. +wheezing since his last hospitalization. Quit smoking in 2014. He lives in a non-air-conditioned log cabin.  He was hospitalized from 7/27-28 for acute on chronic respiratory failure associated with COPD exacerbation and PNA. This was his 4th admission this year. He was seen by Dr. Joya [name] on 7/30 with plan for completion of course of Keflex and prednisone, as well as prednisone taper.  Hospital Course:  Acute on chronic hypoxic respiratory failure/sepsis due to multifocal pneumonia -Was recently hospitalized in late July for pneumonia and COPD exacerbation. Was discharged with antibiotics as well as prednisone taper. Patient followed up with Dr. Joya [name] on 08/03/2018 with completion of Keflex and prednisone as well. -This is his fourth admission this year for similar presentation -Patient presented with cough and shortness of breath -Chest x-rayon admissionfindings consistent with right-sided multifocal pneumonia -CTA chest showed multifocal pneumonia on the right. COPD including emphysema. Negative for PE. -COVID negative -Upon admission, patient had leukocytosis, fever, tachycardia, tachypnea- all resolving  -Noted to be hypoxic 88% on room air in  the ED -Urine strep pneumoniaand legionellaantigensnegative -Sputum culture: Few GPC, GVR, rare yeast -Blood cultures show no growth -was placed on vancomycin, Zosyn, Solu-Medrol, supplemental oxygen -S/p bronchoscopy 8/6, pending culture results -CXR obtained after bronch on 8/6: almost complete clearing of the hazy infiltrates in the right lung -Discussed with Dr. Tamala Julian (pulm), recommended prednisone taper starting with prednisone 40mg  daily x 1 week, taper as an outpatient, along with Augmentin. He will follow up with the patient in one week. Given that infection cleared on xray so quickly, question if this is inflammatory vs infection.  -patient able to ambulate today without oxygen and maintained oxygen saturations in the mid 90s- therefore patient does not qualify for home oxygen -patient also had a swallow eval- mild aspiration risk  COPD -Continue nebulizer treatments, steroids, inhalers -Incentive spirometry, flutter valve  Alcohol/marijuana use -Discussed cessation -was placed on continue CIWA protocol  Essential hypertension -Continue Cozaar  Consultants PCCM- pulmonology   Procedures Bronchoscopy   Admit date: 09/11/2018 Discharge date: 09/13/2018  Admitted From: Home Disposition: Home  Recommendations for Outpatient Follow-up:  1. Follow up with PCP in 1-2 weeks 2. Please obtain BMP/CBC in one week your next doctors visit.  3. Augmentin and azithromycin for 5 days 4. Advised to continue using bronchodilators every 4-6 hours for the next 4-5 days and then transition to as needed. 5. Advised to take his home prednisone, for next 3 days advised to increase it to 40 mg twice daily then resume back is 20 mg daily  Discharge  Condition: Stable CODE STATUS: Full code Diet recommendation: 2 g salt  Brief/Interim Summary: James [name] a 58 y.o.malewith history ofCOPD on prednisone, HTN, alcohol use disorder, marijuana use,tobacco user. Patient  presented secondary to dyspnea and found to have evidence of pneumonia and a COPD exacerbation in addition to sepsis physiology on admission.  There is concerns of possible right lower lobe pneumonia therefore started on Unasyn and azithromycin due to concerns of aspiration.  This was transitioned to Augmentin and azithromycin for total of 5 days at time of discharge.  Secondary to infection, he was having COPD exacerbation therefore on bronchodilators.  He started improving and on the day of discharge he was adamant about going home as he was feeling much better.  He was still having abnormal breath sounds and cough therefore I had requested him to stay another day in the hospital but still wanted to go home.  Instructions were given to him take 40 mg of prednisone for next 3 days and then he can resume back his 20 mg daily.  Advised... home bronchodilators periodically every 4-6 hours for next 4-5 days and then transition to as needed. Advised to follow-up with his PCP in about 1 week.   Discharge Diagnoses:  Active Problems:   Hypertension   COPD exacerbation ([place])   Marijuana abuse   Pneumonia   Sepsis ([place])  Acute respiratory distress secondary to right lower lobe pneumonia, community-acquired/aspiration Acute mild exacerbation of COPD Concern for possible aspiration.  Unasyn and azithromycin transition to Augmentin and azithromycin.  We will give him 5 more day course at the time of discharge 40 mg of prednisone for next 3 days then transition to 20 mg daily which is his home dose Advised to use bronchodilators.3  Alcohol use disorder Counseled to quit drinking alcohol.  Advised to continue thiamine, folate and multivitamin  Weakness -He did well with physical therapy.  Tobacco use Marijuana use Cessation discussed on admission  Consultations:  None  Subjective: Still having some cough and congestion but he is adamant about going home today as he is feeling much better  than from admission.  I recommended he stay in the hospital for at least 1 more day to help him feel even better.  He wished to go home. Ambulating in the hallway without additional saturating greater than 90% on room air.   Transition Care Management Follow-up Telephone Call  Call completed with patient's sister, [name].     Date of discharge and from where: 09/13/2018, [facility]  How have you been since you were released from the hospital? His sister said that he is coming to see her tonight and she will see how he is doing.    Any questions or concerns? Her concerns were regarding his general health and questionable compliance with medications and healthy lifestyle recommendations.   She also reported concerns about his home environment - the house is missing parts of the floor, there is mold.  He is staying in a rental property that is owned by a family acquaintance.  The patient lives in the home and the landlord gives patient a weekly stipend as well as pay his utilities. However, the landlord requires that the patient work for him and the patient should not be working.  [name] did not want to involve [employer] [place] or DSS, noting how upset the landlord would become.   After working daily, the patient told her that he drinks 4 - 20oz beer/night.  She  also has concerns about his possible crystal meth use in the past. She is not sure that he has mentioned this to any providers.   Items Reviewed:  Did the pt receive and understand the discharge instructions provided? [name] does not have the instructions. She said that her brother is supposed to bring them to her tonight.   Medications obtained and verified? [name] said that she had her brother pick up the new antibiotics at the pharmacy and he said that he did. She said that she instructed him to bring all of his medications and the instructions to her when he comes to see her tonight and she will review everything.   She said that he tells her that he is taking the medications as instructed.  He has a system that he uses as he is not abe to read/spell/ add as per [name].   She said that he does not have any prednisone and is not sure that he is supposed to be on it. He needs a new prescription.  It's listed on the AVS to take 20 mg daily. The provider's discharge summary notes patient advised to take home prednisone, for next 3 days to increase to 40 mg twice daily and then resume 20 mg daily. Informed her that the provider would be notified of this question.   Any new allergies since your discharge? None reported   Do you have support at home? He lives alone as noted above and has been living under the care of this landlord since he was 58 years old.  [name] lives about 1 mile away and is his primary support. However, she said that right now she is very frustrated with him because of his non -compliance.   Other (ie: DME, Home Health, etc) no home health ordered.   His sister has a BP monitor and oximeter  Has has an aerochamber and nebulizer.  He has no insurance. Sister is waiting for certified information from [place] records that she needs to complete application for replacement social security card. She then will assist him with applying for disability, medicaid and orange card/[facility] Financial assistance as needed.   Functional Questionnaire: (I = Independent and D = Dependent) ADL's: independent , sister assists with medication management.    Follow up appointments reviewed:    PCP Hospital f/u appt confirmed? 09/21/2018 @ 1500 at [facility].   [facility] f/u appt confirmed? He missed cardiology appointment today.  [name] said that she forgot about it and will need to re-schedule.  She had scheduled today's appointment,   Are transportation arrangements needed? [name] to provide transportation.   If their condition worsens, is the pt aware to call  their PCP or go to the ED? Yes    Was the patient provided with contact information for the PCP's office or ED? [name] has the clinic number  Was the pt encouraged to call back with questions or concerns? Yes [name] was encouraged to call.    Shortness of Breath This is a chronic problem. The current episode started more than 1 year ago. The problem occurs daily (qhs shortness of breath). The problem has been gradually worsening. Associated symptoms include chest pain, PND and wheezing. Pertinent negatives include no claudication, fever, headaches, hemoptysis, leg pain, leg swelling, orthopnea, rash, sore throat, sputum production, syncope or vomiting. The symptoms are aggravated by any activity and weather changes. Associated symptoms comments: Pain radiates to arm Legs go to sleep No dysphagia No gerd . Risk factors  include smoking. He has tried beta agonist inhalers, oral steroids and steroid inhalers for the symptoms. The treatment provided moderate relief. His past medical history is significant for COPD and pneumonia. There is no history of asthma, CAD, DVT, a heart failure or PE.    Past Medical History:  Diagnosis Date  . Acute on chronic respiratory failure with hypoxia ([place]) 06/08/2013  . COPD (chronic obstructive pulmonary disease) ([place])    not on home  . Dyspnea   . Hypertension   . Pneumonia 04/06/2016  . Pneumothorax    2016, fell from ladder  . Tick bite 06/03/2018     Family History  Problem Relation Age of Onset  . Heart disease Father   . COPD Sister      Social History   Socioeconomic History  . Marital status: Divorced    Spouse name: Not on file  . Number of children: Not on file  . Years of education: Not on file  . Highest education level: Not on file  Occupational History  . Occupation: Dealer  . Occupation: [employer]  . Financial resource strain: Hard  . Food insecurity    Worry: Not on file    Inability: Never true  . Transportation needs    Medical: No     Non-medical: No  Tobacco Use  . Smoking status: Former Smoker    Packs/day: 1.50    Years: 40.00    Pack years: 60.00    Types: Cigarettes    Quit date: 2014    Years since quitting: 6.7  . Smokeless tobacco: Current User    Types: Chew  Substance and Sexual Activity  . Alcohol use: Yes    Alcohol/week: 28.0 standard drinks    Types: 28 Cans of beer per week    Comment: 4 beers a night; + jitters with not drinking, denies DTs or seizures  . Drug use: Yes    Types: Marijuana    Comment: daily; quit using crack and methamphetamine about 5 months ago   . Sexual activity: Not Currently    Partners: Female  Lifestyle  . Physical activity    Days per week: 0 days    Minutes per session: 0 min  . Stress: To some extent  Relationships  . Social Herbalist on phone: Three times a week    Gets together: Once a week    Attends religious service: 1 to 4 times per year    Active member of club or organization: Yes    Attends meetings of clubs or organizations: 1 to 4 times per year    Relationship status: Divorced  . Intimate partner violence    Fear of current or ex partner: Not on file    Emotionally abused: Not on file    Physically abused: Not on file    Forced sexual activity: Not on file  Other Topics Concern  . Not on file  Social History Narrative   Lives alone near [place]     No Known Allergies   Outpatient Medications Prior to Visit  Medication Sig Dispense Refill  . albuterol (PROVENTIL HFA;VENTOLIN HFA) 108 (90 Base) MCG/ACT inhaler Inhale 1-2 puffs into the lungs every 6 (six) hours as needed for wheezing or shortness of breath. 1 Inhaler 5  . albuterol (PROVENTIL) (2.5 MG/3ML) 0.083% nebulizer solution Take 3 mLs (2.5 mg total) by nebulization every 6 (six) hours as needed for wheezing or shortness of breath. 150 mL 1  .  amoxicillin-clavulanate (AUGMENTIN) 875-125 MG tablet Take 1 tablet by mouth every 12 (twelve) hours for 5 days. 10 tablet 0  .  azithromycin (ZITHROMAX) 500 MG tablet Take 1 tablet (500 mg total) by mouth daily for 5 days. 5 tablet 0  . calcium carbonate (TUMS - DOSED IN MG ELEMENTAL CALCIUM) 500 MG chewable tablet Chew 1 tablet by mouth daily as needed for indigestion or heartburn.    . folic acid (FOLVITE) 1 MG tablet Take 1 tablet (1 mg total) by mouth daily. 90 tablet 0  . losartan (COZAAR) 100 MG tablet Take 1 tablet (100 mg total) by mouth daily. 30 tablet 3  . mometasone-formoterol (DULERA) 200-5 MCG/ACT AERO Inhale 2 puffs into the lungs 2 (two) times daily. 13 g 2  . predniSONE (DELTASONE) 10 MG tablet Take 4 tablets daily for 5 days then stop 20 tablet 0  . Respiratory Therapy Supplies (FLUTTER) DEVI Use 4 times daily 1 each 0  . Spacer/Aero-Holding Chambers (AEROCHAMBER MV) inhaler Use as instructed 1 each 0  . thiamine (VITAMIN B-1) 100 MG tablet Take 1 tablet (100 mg total) by mouth daily. 30 tablet 3  . umeclidinium bromide (INCRUSE ELLIPTA) 62.5 MCG/INH AEPB Inhale 1 puff into the lungs daily. 30 each 5   No facility-administered medications prior to visit.      Review of Systems  Constitutional: Negative for fever.  HENT: Negative for sinus pressure, sinus pain, sore throat and trouble swallowing.   Eyes: Negative.   Respiratory: Positive for shortness of breath and wheezing. Negative for hemoptysis and sputum production.   Cardiovascular: Positive for chest pain and PND. Negative for palpitations, orthopnea, claudication, leg swelling and syncope.  Gastrointestinal: Negative for nausea and vomiting.  Genitourinary: Negative.   Musculoskeletal: Negative.   Skin: Negative for rash.  Neurological: Negative for headaches.  Psychiatric/Behavioral: The patient is not nervous/anxious and is not hyperactive.        Objective:   Physical Exam There were no vitals filed for this visit. 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  ENT: No lesions,  mouth clear,  oropharynx clear, no postnasal drip  Neck: No JVD, no TMG, no carotid bruits  Lungs: No use of accessory muscles, no dullness to percussion, distant breath sounds with improved breath sounds from the last office visit without wheezes  Cardiovascular: RRR, heart sounds normal, no murmur or gallops, no peripheral edema  Abdomen: soft and NT, no HSM,  BS normal  Musculoskeletal: No deformities, no cyanosis or clubbing  Neuro: alert, non focal  Skin: Warm, no lesions or rashes  7/14 CXR IMPRESSION: Stable mild hazy opacity of right lung base and interval developed patchy opacity of medial left lung base suspicious for pneumonia.  BMP Latest Ref Rng & Units 09/12/2018 09/11/2018 08/11/2018  Glucose 70 - 99 mg/dL 121(H) 139(H) 195(H)  BUN 6 - 20 mg/dL 18 18 16   Creatinine 0.61 - 1.24 mg/dL 0.61 0.87 0.86  BUN/Creat Ratio 9 - 20 - - -  Sodium 135 - 145 mmol/L 138 140 138  Potassium 3.5 - 5.1 mmol/L 4.3 4.3 4.2  Chloride 98 - 111 mmol/L 106 103 102  CO2 22 - 32 mmol/L 24 27 27   Calcium 8.9 - 10.3 mg/dL 8.4(L) 9.0 8.7(L)  Hepatic Function Panel     Component Value Date/Time   PROT 6.0 06/20/2018 1159   ALBUMIN 4.1 06/20/2018 1159   AST 34 06/20/2018 1159   ALT 37 06/20/2018 1159   ALT 62 (H) 06/23/2016 1226   ALKPHOS 43 06/20/2018 1159   BILITOT 0.4 06/20/2018 1159   BILIDIR 0.12 04/16/2016 1601   IBILI NOT CALCULATED 03/26/2016 1845   CBC Latest Ref Rng & Units 09/13/2018 09/12/2018 09/11/2018  WBC 4.0 - 10.5 K/uL 20.2(H) 19.6(H) 26.1(H)  Hemoglobin 13.0 - 17.0 g/dL 13.9 13.8 14.7  Hematocrit 39.0 - 52.0 % 42.5 43.1 44.7  Platelets 150 - 400 K/uL 240 225 239        Assessment & Plan:  I personally reviewed all images and lab data in the [facility] system as well as any outside material available during  this office visit and agree with the  radiology impressions.   No problem-specific Assessment & Plan notes found for this encounter.

## 2018-09-18 NOTE — Telephone Encounter (Signed)
I am seeing him Thursday.  I sent a Rx for prednisone to our [facility]  pharmacy

## 2018-09-18 NOTE — Telephone Encounter (Signed)
Call placed to patient's sister, James [name],  and informed her that [name] sent a prescription for prednisone to [facility] pharmacy. She was very Patent attorney. She noted that he had not picked up the antibiotics as he had told her and she needed to pick them up for him. She stated that he is doing okay and is now taking the antibiotics as prescribed.    she also said that she received a call from " someone" at the hospital who told her that they were going to file for medicaid and disability for him because he has a large balance at the hospital.  She was not sure who called. She said that she is still waiting for the information from medical records that will allow her to apply for a replacement social security card for him.

## 2018-09-20 ENCOUNTER — Telehealth: Payer: Self-pay

## 2018-09-20 MED FILL — !INCRUSE ELLIPTA 62.5 MCG I: 62.5 | 30 days supply | Qty: 30 | Fill #1

## 2018-09-20 NOTE — Telephone Encounter (Signed)
Called patient to do their pre-visit COVID screening.  Call went to voicemail which was full. Unable to do prescreening.  Called patient's sister to confirm appointment.

## 2018-09-21 ENCOUNTER — Inpatient Hospital Stay: Payer: Self-pay

## 2018-09-22 NOTE — Telephone Encounter (Signed)
Made in error

## 2018-09-25 ENCOUNTER — Ambulatory Visit
Admission: EM | Admit: 2018-09-25 | Discharge: 2018-09-25 | Disposition: A | Discharge status: Home / Self Care | Payer: Self-pay | Attending: Emergency Medicine | Admitting: Emergency Medicine

## 2018-09-25 ENCOUNTER — Other Ambulatory Visit: Payer: Self-pay

## 2018-09-25 ENCOUNTER — Encounter: Payer: Self-pay | Admitting: Emergency Medicine

## 2018-09-25 DIAGNOSIS — B029 Zoster without complications: Secondary | ICD-10-CM

## 2018-09-25 LAB — POCT URINALYSIS DIP (MANUAL ENTRY)
Bilirubin, UA: NEGATIVE
Blood, UA: NEGATIVE
Glucose, UA: 500 mg/dL — AB
Ketones, POC UA: NEGATIVE mg/dL
Leukocytes, UA: NEGATIVE
Nitrite, UA: NEGATIVE
Protein Ur, POC: NEGATIVE mg/dL
Spec Grav, UA: >=1.03 — AB (ref 1.010–1.025)
Urobilinogen, UA: 0.2 E.U./dL
pH, UA: 5.5 (ref 5.0–8.0)

## 2018-09-25 MED ORDER — VALACYCLOVIR HCL 1 G PO TABS: 1000.0000 mg | ORAL_TABLET | Freq: Three times a day (TID) | ORAL | 0 refills | Status: DC

## 2018-09-25 MED ORDER — PREDNISONE 10 MG (21) PO TBPK: ORAL_TABLET | Freq: Every day | ORAL | 0 refills | Status: DC

## 2018-09-25 MED FILL — ?PREDNISONE 10 MG TABLET: 10 | 12 days supply | Qty: 42 | Fill #0

## 2018-09-25 MED FILL — valACYclovir HCL 1 GM TABS: 1 | 7 days supply | Qty: 21 | Fill #0

## 2018-09-25 NOTE — ED Notes (Signed)
Patient able to ambulate independently  

## 2018-09-25 NOTE — Discharge Instructions (Signed)
Keep area clean and dry. Important wash hands, do not have other people touching rash. Important to take valacyclovir 3 times daily: Finish all medications. Take prednisone taper as prescribed.

## 2018-09-25 NOTE — ED Triage Notes (Signed)
Patient presents to [facility] for assessment of 1 week of right flank pain.  Denies n/v.  Patient c/o sometimes having difficulty starting his stream.  Denies blood.

## 2018-09-25 NOTE — ED Provider Notes (Signed)
James [name]    CSN: [id number] Arrival date & time: 09/25/18  1405      History   Chief Complaint Chief Complaint  Patient presents with  . Flank Pain    HPI James [name] is a 58 y.o. male.  With history of COPD, hypertension presenting for right flank pain for the last week.  States he had a single episode of difficulty starting his urinary stream: Denies history of BPH, dribbling urine, hematuria, urinary frequency.  Thought maybe he pulled his back out at work, though has not noticed any improvement with NSAIDs.   Past Medical History:  Diagnosis Date  . Acute on chronic respiratory failure with hypoxia ([place]) 06/08/2013  . COPD (chronic obstructive pulmonary disease) ([place])    not on home  . Dyspnea   . Hypertension   . Pneumonia 04/06/2016  . Pneumothorax    2016, fell from ladder  . Tick bite 06/03/2018    Patient Active Problem List   Diagnosis Date Noted  . Sepsis ([place]) 09/12/2018  . Pneumonia 09/11/2018  . Multifocal pneumonia 08/09/2018  . Marijuana abuse 08/09/2018  . CAP (community acquired pneumonia) 05/17/2018  . COPD exacerbation ([place]) 02/16/2018  . Hypertension 05/17/2016  . Low back pain radiating to both legs 04/18/2016  . History of tobacco abuse 04/06/2016  . Alcohol abuse 04/06/2016  . Elevated liver enzymes 04/06/2016  . COPD mixed type ([place]) 03/26/2016    Past Surgical History:  Procedure Laterality Date  . VIDEO ASSISTED THORACOSCOPY (VATS)/THOROCOTOMY Left 06/11/2013   Procedure: VIDEO ASSISTED THORACOSCOPY (VATS)/THOROCOTOMY;  Surgeon: Ivin Poot, MD;  Location: [facility];  Service: Thoracic;  Laterality: Left;  [name] VIDEO BRONCHOSCOPY N/A 06/11/2013   Procedure: VIDEO BRONCHOSCOPY;  Surgeon: Ivin Poot, MD;  Location: [facility];  Service: Thoracic;  Laterality: N/A;  . VIDEO BRONCHOSCOPY N/A 08/10/2018   Procedure: VIDEO BRONCHOSCOPY WITH FLUORO;  Surgeon: Candee Furbish, MD;  Location[facility] ENDOSCOPY;  Service: Endoscopy;   Laterality: N/A;       Home Medications    Prior to Admission medications   Medication Sig Start Date End Date Taking? Authorizing Provider  albuterol (PROVENTIL HFA;VENTOLIN HFA) 108 (90 Base) MCG/ACT inhaler Inhale 1-2 puffs into the lungs every 6 (six) hours as needed for wheezing or shortness of breath. 03/29/18   Scot Jun, FNP  albuterol (PROVENTIL) (2.5 MG/3ML) 0.083% nebulizer solution Take 3 mLs (2.5 mg total) by nebulization every 6 (six) hours as needed for wheezing or shortness of breath. 06/20/18   Scot Jun, FNP  calcium carbonate (TUMS - DOSED IN MG ELEMENTAL CALCIUM) 500 MG chewable tablet Chew 1 tablet by mouth daily as needed for indigestion or heartburn.    [provider]  folic acid (FOLVITE) 1 MG tablet Take 1 tablet (1 mg total) by mouth daily. 07/18/18   Scot Jun, FNP  losartan (COZAAR) 100 MG tablet Take 1 tablet (100 mg total) by mouth daily. 04/27/18   [name], MD  mometasone-formoterol (DULERA) 200-5 MCG/ACT AERO Inhale 2 puffs into the lungs 2 (two) times daily. 07/18/18   Scot Jun, FNP  predniSONE (STERAPRED UNI-PAK 21 TAB) 10 MG (21) TBPK tablet Take by mouth daily. Take 6 tabs by mouth daily  for 2 days, then 5 tabs for 2 days, then 4 tabs for 2 days, then 3 tabs for 2 days, 2 tabs for 2 days, then 1 tab by mouth daily for 2 days 09/25/18   Hall-Potvin,  [place], PA-C  Respiratory Therapy Supplies (FLUTTER) DEVI Use 4 times daily 04/27/18   [name], MD  Spacer/Aero-Holding Chambers (AEROCHAMBER MV) inhaler Use as instructed 08/03/18   [name], MD  thiamine (VITAMIN B-1) 100 MG tablet Take 1 tablet (100 mg total) by mouth daily. 08/03/18   [name], MD  umeclidinium bromide (INCRUSE ELLIPTA) 62.5 MCG/INH AEPB Inhale 1 puff into the lungs daily. 07/25/18   [name], MD  valACYclovir (VALTREX) 1000 MG tablet Take 1 tablet (1,000 mg total) by mouth 3 (three) times daily. 09/25/18    Hall-Potvin, [place], PA-C    Family History Family History  Problem Relation Age of Onset  . Heart disease Father   . COPD Sister     Social History Social History   Tobacco Use  . Smoking status: Former Smoker    Packs/day: 1.50    Years: 40.00    Pack years: 60.00    Types: Cigarettes    Quit date: 2014    Years since quitting: 6.7  . Smokeless tobacco: Current User    Types: Chew  Substance Use Topics  . Alcohol use: Yes    Alcohol/week: 28.0 standard drinks    Types: 28 Cans of beer per week    Comment: 4 beers a night; + jitters with not drinking, denies DTs or seizures  . Drug use: Yes    Types: Marijuana    Comment: daily; quit using crack and methamphetamine about 5 months ago      Allergies   Patient has no known allergies.   Review of Systems Review of Systems  Constitutional: Negative for fatigue and fever.  Respiratory: Negative for cough and shortness of breath.   Cardiovascular: Negative for chest pain and palpitations.  Gastrointestinal: Negative for abdominal pain, diarrhea and vomiting.  Genitourinary: Negative for dysuria, frequency and hematuria.  Musculoskeletal: Positive for back pain. Negative for arthralgias and myalgias.  Skin: Negative for rash and wound.  Neurological: Negative for speech difficulty and headaches.  All other systems reviewed and are negative.    Physical Exam Triage Vital Signs ED Triage Vitals [09/25/18 1420]  Enc Vitals Group     BP 129/86     Pulse Rate (!) 101     Resp 20     Temp 97.9 F (36.6 C)     Temp Source Oral     SpO2 95 %     Weight      Height      Head Circumference      Peak Flow      Pain Score 8     Pain Loc      Pain Edu?      Excl. in [place]?    No data found.  Updated Vital Signs BP 129/86 (BP Location: Left Arm)   Pulse (!) 101   Temp 97.9 F (36.6 C) (Oral)   Resp 20   SpO2 95%   Visual Acuity Right Eye Distance:   Left Eye Distance:   Bilateral Distance:    Right  Eye Near:   Left Eye Near:    Bilateral Near:     Physical Exam Constitutional:      General: He is not in acute distress. HENT:     Head: Normocephalic and atraumatic.  Eyes:     General: No scleral icterus.    Pupils: Pupils are equal, round, and reactive to light.  Cardiovascular:     Rate and Rhythm: Normal rate.  Pulmonary:     Effort: Pulmonary effort is normal.  Skin:    Coloration: Skin is not jaundiced or pale.     Comments: Right low thoracic/upper lumbar dermatomal distribution of raised, erythematous, papular vesicular lesions.  No warmth, discharge  Neurological:     Mental Status: He is alert and oriented to person, place, and time.      UC Treatments / Results  Labs (all labs ordered are listed, but only abnormal results are displayed) Labs Reviewed  POCT URINALYSIS DIP (MANUAL ENTRY) - Abnormal; Notable for the following components:      Result Value   Glucose, UA =500 (*)    Spec Grav, UA >=1.030 (*)    All other components within normal limits    EKG   Radiology No results found.  Procedures Procedures (including critical [name] time)  Medications Ordered in UC Medications - No data to display  Initial Impression / Assessment and Plan / UC Course  I have reviewed the triage vital signs and the nursing notes.  Pertinent labs & imaging results that were available during my [name] of the patient were reviewed by me and considered in my medical decision making (see chart for details).     1.  Herpes zoster without complication She with 1 week course of back pain, lesions found during exam: Patient denies knowing he had a rash.  Will treat with valacyclovir, prednisone.  Return precautions discussed, patient verbalized understanding and is agreeable to plan.  Of note, patient was admitted earlier this month for sepsis: Doing much better, though has not yet had PCP follow-up.  This provider coordinated [name]: Hospital discharge follow-up on 10/1 at 4  PM.  Patient verbalized understanding of appointment, intends to keep it.   Final Clinical Impressions(s) / UC Diagnoses   Final diagnoses:  Herpes zoster without complication     Discharge Instructions     Keep area clean and dry. Important wash hands, do not have other people touching rash. Important to take valacyclovir 3 times daily: Finish all medications. Take prednisone taper as prescribed.     ED Prescriptions    Medication Sig Dispense Auth. Provider   valACYclovir (VALTREX) 1000 MG tablet Take 1 tablet (1,000 mg total) by mouth 3 (three) times daily. 21 tablet Hall-Potvin, [place], PA-C   predniSONE (STERAPRED UNI-PAK 21 TAB) 10 MG (21) TBPK tablet Take by mouth daily. Take 6 tabs by mouth daily  for 2 days, then 5 tabs for 2 days, then 4 tabs for 2 days, then 3 tabs for 2 days, 2 tabs for 2 days, then 1 tab by mouth daily for 2 days 42 tablet Hall-Potvin, [place], PA-C     PDMP not reviewed this encounter.   Hall-Potvin, [place], [place] 09/25/18 1508

## 2018-09-27 ENCOUNTER — Other Ambulatory Visit: Payer: Self-pay | Admitting: Critical Care Medicine

## 2018-09-28 ENCOUNTER — Telehealth: Payer: Self-pay | Admitting: Critical Care Medicine

## 2018-09-28 MED ORDER — GABAPENTIN 300 MG PO CAPS: 300.0000 mg | ORAL_CAPSULE | Freq: Three times a day (TID) | ORAL | 3 refills | Status: DC

## 2018-09-28 MED ORDER — FOLIC ACID 1 MG PO TABS: 1.0000 mg | ORAL_TABLET | Freq: Every day | ORAL | 1 refills | Status: DC

## 2018-09-28 MED FILL — LOSARTAN POTASSIUM 100 MG T: 100 | 30 days supply | Qty: 30 | Fill #0

## 2018-09-28 MED FILL — GABAPENTIN 300 MG CAPSULE: 300 | 30 days supply | Qty: 90 | Fill #0

## 2018-09-28 MED FILL — ?FOLIC ACID 1MG TAB: 1 | 30 days supply | Qty: 30 | Fill #0

## 2018-09-28 NOTE — Telephone Encounter (Signed)
Patient with onset of shingles and was seen in urgent care on Monday.  He is on the prednisone and Valtrex been having increased pain and Tylenol ibuprofen not helping  I spoke to the patient's sister and will call in gabapentin 300 mg 3 times daily for neuropathic pain and have refilled the folic acid

## 2018-09-28 NOTE — Telephone Encounter (Signed)
Patients sister James [name] has called in regards to [name] having shingles. Sister stated that patient is in so much pain, says that he has never asked for any kind of pain pills, but he is begging because it hurts so badly and tylenol/ibuprofen is not helping at all.

## 2018-10-02 ENCOUNTER — Telehealth: Payer: Self-pay | Admitting: Family Medicine

## 2018-10-02 NOTE — Telephone Encounter (Signed)
Patient sister called, pt came to her yesterday still crying in pain. Said medication isn't working to relieve pain.   Patient has an appointment with you on 10/1. Sister wanted to know if there was something else he could try to hold him over until Thursday.

## 2018-10-03 ENCOUNTER — Telehealth: Payer: Self-pay

## 2018-10-03 MED ORDER — TRAMADOL HCL 50 MG PO TABS: 50.0000 mg | ORAL_TABLET | Freq: Three times a day (TID) | ORAL | 0 refills | Status: DC | PRN

## 2018-10-03 MED FILL — traMADol HCL 50 MG TABS: 50 | 5 days supply | Qty: 15 | Fill #0

## 2018-10-03 NOTE — Telephone Encounter (Signed)
I will Rx short term tramadol.    [place] [facility] database reviewed and is appropriate.

## 2018-10-03 NOTE — Telephone Encounter (Signed)
Met with patient when he came to the pharmacy today to pick up tramadol. He did not have his ID and was not permitted to pick up the medication. He said that his sister would come to pick it up.  He was not sure if his sister received the information from hospital medical records about his certified demographic information that he needs to apply for a replacement social security card.  He confirmed that he is aware of his appointment at [facility] on 10/05/2018 @ 1600.   Call placed to patient's sister, who stated that she picked up the tramadol for patient this afternoon. She also said that she received the demographic information from hospital medical records and just needs to mail information to social security for replacement card.  She also confirmed his appointment at [facility] on 10/05/2018.

## 2018-10-03 NOTE — Telephone Encounter (Signed)
Patient & sister aware of upcoming appointment on Thursday.

## 2018-10-04 ENCOUNTER — Telehealth: Payer: Self-pay

## 2018-10-04 NOTE — Telephone Encounter (Signed)
Called patient to do their pre-visit COVID screening.  Spoke with sister to confirm appointment. She states that she will be coming to appointment with patient. Unable to do prescreening since she is not with patient. Called patient's phone & it went to voicemail.

## 2018-10-05 ENCOUNTER — Other Ambulatory Visit: Payer: Self-pay

## 2018-10-05 ENCOUNTER — Inpatient Hospital Stay: Payer: Self-pay

## 2018-10-05 ENCOUNTER — Ambulatory Visit (INDEPENDENT_AMBULATORY_CARE_PROVIDER_SITE_OTHER): Payer: Self-pay

## 2018-10-05 ENCOUNTER — Ambulatory Visit (INDEPENDENT_AMBULATORY_CARE_PROVIDER_SITE_OTHER): Payer: Self-pay | Admitting: Critical Care Medicine

## 2018-10-05 VITALS — BP 117/73 | HR 102 | Temp 98.8°F | Resp 18 | Ht 69.5 in | Wt 175.0 lb

## 2018-10-05 DIAGNOSIS — J441 Chronic obstructive pulmonary disease with (acute) exacerbation: Secondary | ICD-10-CM

## 2018-10-05 DIAGNOSIS — F1010 Alcohol abuse, uncomplicated: Secondary | ICD-10-CM

## 2018-10-05 DIAGNOSIS — R05 Cough: Secondary | ICD-10-CM

## 2018-10-05 DIAGNOSIS — B0223 Postherpetic polyneuropathy: Secondary | ICD-10-CM

## 2018-10-05 DIAGNOSIS — I10 Essential (primary) hypertension: Secondary | ICD-10-CM

## 2018-10-05 DIAGNOSIS — J189 Pneumonia, unspecified organism: Secondary | ICD-10-CM

## 2018-10-05 DIAGNOSIS — J181 Lobar pneumonia, unspecified organism: Secondary | ICD-10-CM

## 2018-10-05 HISTORY — DX: Postherpetic polyneuropathy: B02.23

## 2018-10-05 MED ORDER — CALAMINE EX LOTN: 1.0000 "application " | TOPICAL_LOTION | CUTANEOUS | 0 refills | Status: DC | PRN

## 2018-10-05 MED ORDER — PREDNISONE 10 MG PO TABS: ORAL_TABLET | ORAL | 0 refills | Status: DC

## 2018-10-05 MED ORDER — LEVOFLOXACIN 500 MG PO TABS: 500.0000 mg | ORAL_TABLET | Freq: Every day | ORAL | 0 refills | Status: DC

## 2018-10-05 MED ORDER — AMITRIPTYLINE HCL 25 MG PO TABS: 25.0000 mg | ORAL_TABLET | Freq: Every day | ORAL | 1 refills | Status: DC

## 2018-10-05 MED ORDER — ONDANSETRON HCL 4 MG PO TABS: 4.0000 mg | ORAL_TABLET | Freq: Three times a day (TID) | ORAL | 0 refills | Status: DC | PRN

## 2018-10-05 MED FILL — ?AMITRIPTYLINE HCL 25 MG TA: 25 | 30 days supply | Qty: 30 | Fill #0

## 2018-10-05 MED FILL — levoFLOXacin 500 MG TABS: 500 | 5 days supply | Qty: 5 | Fill #0

## 2018-10-05 MED FILL — ONDANSETRON HCL 4 MG TABLET: 4 | 6 days supply | Qty: 20 | Fill #0

## 2018-10-05 MED FILL — ?PREDNISONE 10 MG TABLET: 10 | 16 days supply | Qty: 40 | Fill #0

## 2018-10-05 NOTE — Patient Instructions (Addendum)
Stop gabapentin and tramadol  Begin amitriptyline 1 at bedtime 25 mg to control the pain in your back from the shingles  Restart prednisone 10 mg take 4 a day and every 4 days reduce by 1 until you are off so is 40 mg a day for 4 days 30 mg a day for 4 days 20 mg a day for 4 days then 10 mg a day for 4 days then stop  Apply calamine lotion to your affected area of the back twice a day  Zofran as needed for nausea  Take Levaquin 1 daily for 5 days for bronchitis  Stay on the [facility] and Incruse and stay on your nebulizer treatments  Return to see [name] in 1 week  I would like for a community para medicine program to come out to check on you at your home on a frequent basis to see how your breathing is doing we have discussed this in the office today and I will discuss this with your sister

## 2018-10-05 NOTE — Assessment & Plan Note (Signed)
Patient claims he is no longer drinking alcohol at this time

## 2018-10-05 NOTE — Assessment & Plan Note (Signed)
Hypertension well-controlled on losartan will continue same

## 2018-10-05 NOTE — Assessment & Plan Note (Signed)
Chest x-ray today shows no evidence of active process and pneumonia has cleared

## 2018-10-05 NOTE — Assessment & Plan Note (Signed)
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

## 2018-10-05 NOTE — Progress Notes (Signed)
Subjective:    Patient ID: James [name], male    DOB: 28-Mar-1960, 58 y.o.   MRN: [id number]  58 y.o.M with advanced Copd   I saw this patient a week ago and prescribed antibiotics and prednisone for what I perceived to be persisting pneumonia.  The patient had a slight improvement but then worsened and came to the emergency room the end of the week and was admitted for 3 days for COPD exacerbation and lower lobe pneumonia.  Chest x-ray did show persistent infiltrates in the lower lobes.  The patient is now discharged and returns to the office for post hospital follow-up.  Below is the discharge summary Dc summary : Admit date: 07/31/2018 Discharge date: 08/01/2018  Time spent: 45 minutes  Recommendations for Outpatient Follow-up:  Patient will be discharged to home.  Patient will need to follow up with primary care provider within one week of discharge.  Patient should continue medications as prescribed.  Patient should follow a heart healthy diet.   Discharge Diagnoses:  Acute on chronic respiratory failure with hypoxia secondary to COPD Exacerbation and pneumonia Essential hypertension  Discharge Condition: Stable  Diet recommendation: heart healthy  Filed Weights  07/31/18 0231 07/31/18 0602 Weight: 85.3 kg 83.4 kg   History of present illness:  on 07/31/2018 by James [name] a 58 y.o.malewith medical history significant ofCOPD. Patient has had 3 prior admits for COPD / PNA thus far this year, most recently in May.  Patient has been being treated for COPD exacerbation and BLL CAP as outpatient by [name] with levaquin and prednisone. Just finished levaquin yesterday. Prednisone remained at 40mg  daily with plans to initiate a slow taper.  Despite this, patient developed severe SOB and respiratory distress this evening. EMS called and patient noted to be satting 80% on RA. He is not on chronic O2 for his COPD.  Hospital Course:   Acute on chronic respiratory failure with hypoxia secondary to COPD Exacerbation and pneumonia -Patient noted to have oxygen saturations in the 80s on room air -Chest x-ray noted for pneumonia -Was recently placed on Levaquin and steroids by PCP approximately 1 week ago -On admission, patient continued to have wheezing well into his hospital stay day 1.  Patient currently still has some wheezing however has improved. -Was placed on Solu-Medrol, along with nebulizer treatments, azithromycin and ceftriaxone -Patient did require oxygen upon admission however has been able to ambulate and maintain oxygen saturations in the high 90s on room air. -Will discharge patient with prednisone taper, azithromycin and Ceftin. -Given that patient had oxygen saturations in the 80s on admission, it was thought that he would require several days in the hospital. Patient actually improved quicker than expected.  Essential hypertension -Continue losartan  Since discharge the patient is tried to go back to work but has extreme difficulty in the heat with this.  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  10/05/2018 This patient's not been seen since the end of July he had failed several follow-up visits.  He did see Dr. [name] end of August who did not make major changes in his medication  program.  He is to remain on [name] and Incruse.  The patient after being admitted in July got readmitted early August and then again in early September.  Discharge summaries are as noted below.  During the August admission the patient underwent bronchoscopy and had mucous plugging removed the culture showed normal  flora Note this patient has not been actively smoking recently Admit date: 08/09/2018 Discharge date: 08/11/2018  Discharge Diagnoses:  Acute on chronic hypoxic respiratory failure/sepsis due to multifocal pneumonia COPD Alcohol/marijuana use Essential hypertension  History of present illness:  on 08/09/2018 by James [name] a 58 y.o.malewith medical history significant ofCOPD and ETOH dependence presenting with respiratory distress.He woke up about 530 this AM and couldn't breathe. This just keeps happening. He felt similarly when hospitalized last week. He has been doing fine until today. No h/o OSA, not on CPAP. Not on home O2. +cough, productive of yellowish sputum. It is sometimes hard to get the sputum up. No fever. +wheezing since his last hospitalization. Quit smoking in 2014. He lives in a non-air-conditioned log cabin.  He was hospitalized from 7/27-28 for acute on chronic respiratory failure associated with COPD exacerbation and PNA. This was his 4th admission this year. He was seen by [name] on 7/30 with plan for completion of course of Keflex and prednisone, as well as prednisone taper.  Hospital Course:  Acute on chronic hypoxic respiratory failure/sepsis due to multifocal pneumonia -Was recently hospitalized in late July for pneumonia and COPD exacerbation. Was discharged with antibiotics as well as prednisone taper. Patient followed up with [name] on 08/03/2018 with completion of Keflex and prednisone as well. -This is his fourth admission this year for similar presentation -Patient presented with cough and shortness of breath -Chest x-rayon admissionfindings consistent with right-sided multifocal pneumonia -CTA chest showed multifocal pneumonia on the right. COPD including emphysema. Negative for PE. -COVID negative -Upon admission, patient had leukocytosis, fever, tachycardia, tachypnea- all resolving  -Noted to be  hypoxic 88% on room air in the ED -Urine strep pneumoniaand legionellaantigensnegative -Sputum culture: Few GPC, GVR, rare yeast -Blood cultures show no growth -was placed on vancomycin, Zosyn, Solu-Medrol, supplemental oxygen -S/p bronchoscopy 8/6, pending culture results -CXR obtained after bronch on 8/6: almost complete clearing of the hazy infiltrates in the right lung -Discussed with Dr. Tamala Julian (pulm), recommended prednisone taper starting with prednisone 40mg  daily x 1 week, taper as an outpatient, along with Augmentin. He will follow up with the patient in one week. Given that infection cleared on xray so quickly, question if this is inflammatory vs infection.  -patient able to ambulate today without oxygen and maintained oxygen saturations in the mid 90s- therefore patient does not qualify for home oxygen -patient also had a swallow eval- mild aspiration risk  COPD -Continue nebulizer treatments, steroids, inhalers -Incentive spirometry, flutter valve  Alcohol/marijuana use -Discussed cessation -was placed on continue CIWA protocol  Essential hypertension -Continue Cozaar  Consultants PCCM- pulmonology   Procedures Bronchoscopy   Recommendations for Outpatient Follow-up:  1. Follow up with PCP in 1-2 weeks 2. Please obtain BMP/CBC in one week your next doctors visit.  3. Augmentin and azithromycin for 5 days 4. Advised to continue using bronchodilators every 4-6 hours for the next 4-5 days and then transition to as needed. 5. Advised to take his home prednisone, for next 3 days advised to increase it to 40 mg twice daily then resume back is 20 mg daily  Discharge Condition: Stable CODE STATUS:  Full code Diet recommendation: 2 g salt   DC summary from 09/11/18  Adm 9/7  D/C 09/13/18 Brief/Interim Summary: James [name] a 58 y.o.malewith history ofCOPD on prednisone, HTN, alcohol use disorder, marijuana use,tobacco user. Patient presented secondary to  dyspnea and found to have evidence of pneumonia and a COPD exacerbation in addition to sepsis physiology on admission.  There is concerns of possible right lower lobe pneumonia therefore started on Unasyn and azithromycin due to concerns of aspiration.  This was transitioned to Augmentin and azithromycin for total of 5 days at time of discharge.  Secondary to infection, he was having COPD exacerbation therefore on bronchodilators.  He started improving and on the day of discharge he was adamant about going home as he was feeling much better.  He was still having abnormal breath sounds and cough therefore I had requested him to stay another day in the hospital but still wanted to go home.  Instructions were given to him take 40 mg of prednisone for next 3 days and then he can resume back his 20 mg daily.  Advised... home bronchodilators periodically every 4-6 hours for next 4-5 days and then transition to as needed. Advised to follow-up with his PCP in about 1 week.   Discharge Diagnoses:  Active Problems:   Hypertension   COPD exacerbation ([employer])   Marijuana abuse   Pneumonia   Sepsis ([place])  Acute respiratory distress secondary to right lower lobe pneumonia, community-acquired/aspiration Acute mild exacerbation of COPD Concern for possible aspiration.  Unasyn and azithromycin transition to Augmentin and azithromycin.  We will give him 5 more day course at the time of discharge 40 mg of prednisone for next 3 days then transition to 20 mg daily which is his home dose Advised to use bronchodilators.3  Alcohol use disorder Counseled to quit drinking alcohol.  Advised to continue thiamine, folate and multivitamin  Weakness -He did well with physical therapy.  Tobacco use Marijuana use Cessation discussed on admission  Consultations:  None  Subjective: Still having some cough and congestion but he is adamant about going home today as he is feeling much better than from admission.  I  recommended he stay in the hospital for at least 1 more day to help him feel even better.  He wished to go home. Ambulating in the hallway without additional saturating greater than 90% on room air.   Since discharge this patient went to urgent care on 21 September with an outbreak of shingles over his left lower back.  He is taken a course of valacyclovir and prednisone.  He has severe pain.  He was given gabapentin but this caused hallucinations and then we tried tramadol but this caused severe nausea and vomiting.  Apparently the patient cannot take Tylenol or nonsteroidal anti-inflammatories as well.  The patient states his breathing is been labored.  He still has productive cough of white mucus.  He has no fever.  He has significant back pain because of the shingles. The patient is maintaining the [facility] and Incruse as well as his antihypertensive medications and vitamins     Shortness of Breath This is a chronic problem. The current episode started more than 1 year ago. The problem occurs daily (qhs shortness of breath). The problem has been gradually worsening. Associated symptoms include chest pain, PND, a rash, sputum production and wheezing. Pertinent negatives include no claudication, fever, headaches, hemoptysis, leg pain, leg swelling, orthopnea, sore throat, syncope or vomiting. The symptoms are aggravated by  any activity and weather changes. Associated symptoms comments: Pain radiates to arm Legs go to sleep No dysphagia No gerd . Risk factors include smoking. He has tried beta agonist inhalers, oral steroids and steroid inhalers for the symptoms. The treatment provided moderate relief. His past medical history is significant for COPD and pneumonia. There is no history of asthma, CAD, DVT, a heart failure or PE.    Past Medical History:  Diagnosis Date  . Acute on chronic respiratory failure with hypoxia ([place]) 06/08/2013  . CAP (community acquired pneumonia) 05/17/2018   See admit  05/17/18 ? rml  with   covid pcr neg - rx augmentin > f/u cxr in 4-6 weeks is fine unless condition declines   . COPD (chronic obstructive pulmonary disease) ([place])    not on home  . Dyspnea   . Hypertension   . Pneumonia 04/06/2016  . Pneumothorax    2016, fell from ladder  . Tick bite 06/03/2018     Family History  Problem Relation Age of Onset  . Heart disease Father   . COPD Sister      Social History   Socioeconomic History  . Marital status: Divorced    Spouse name: Not on file  . Number of children: Not on file  . Years of education: Not on file  . Highest education level: Not on file  Occupational History  . Occupation: Dealer  . Occupation: [employer]  . Financial resource strain: Hard  . Food insecurity    Worry: Not on file    Inability: Never true  . Transportation needs    Medical: No    Non-medical: No  Tobacco Use  . Smoking status: Former Smoker    Packs/day: 1.50    Years: 40.00    Pack years: 60.00    Types: Cigarettes    Quit date: 2014    Years since quitting: 6.7  . Smokeless tobacco: Current User    Types: Chew  Substance and Sexual Activity  . Alcohol use: Yes    Alcohol/week: 28.0 standard drinks    Types: 28 Cans of beer per week    Comment: 4 beers a night; + jitters with not drinking, denies DTs or seizures  . Drug use: Yes    Types: Marijuana    Comment: daily; quit using crack and methamphetamine about 5 months ago   . Sexual activity: Not Currently    Partners: Female  Lifestyle  . Physical activity    Days per week: 0 days    Minutes per session: 0 min  . Stress: To some extent  Relationships  . Social Herbalist on phone: Three times a week    Gets together: Once a week    Attends religious service: 1 to 4 times per year    Active member of club or organization: Yes    Attends meetings of clubs or organizations: 1 to 4 times per year    Relationship status: Divorced  . Intimate partner violence     Fear of current or ex partner: Not on file    Emotionally abused: Not on file    Physically abused: Not on file    Forced sexual activity: Not on file  Other Topics Concern  . Not on file  Social History Narrative   Lives alone near [place]     Allergies  Allergen Reactions  . Gabapentin Other (See Comments)    hallucinations  . Tramadol Nausea And  Vomiting     Outpatient Medications Prior to Visit  Medication Sig Dispense Refill  . albuterol (PROVENTIL HFA;VENTOLIN HFA) 108 (90 Base) MCG/ACT inhaler Inhale 1-2 puffs into the lungs every 6 (six) hours as needed for wheezing or shortness of breath. 1 Inhaler 5  . albuterol (PROVENTIL) (2.5 MG/3ML) 0.083% nebulizer solution Take 3 mLs (2.5 mg total) by nebulization every 6 (six) hours as needed for wheezing or shortness of breath. 191 mL 1  . folic acid (FOLVITE) 1 MG tablet Take 1 tablet (1 mg total) by mouth daily. 90 tablet 1  . losartan (COZAAR) 100 MG tablet TAKE 1 TABLET (100 MG TOTAL) BY MOUTH DAILY. 30 tablet 2  . mometasone-formoterol (DULERA) 200-5 MCG/ACT AERO Inhale 2 puffs into the lungs 2 (two) times daily. 13 g 2  . Respiratory Therapy Supplies (FLUTTER) DEVI Use 4 times daily 1 each 0  . Spacer/Aero-Holding Chambers (AEROCHAMBER MV) inhaler Use as instructed 1 each 0  . thiamine (VITAMIN B-1) 100 MG tablet Take 1 tablet (100 mg total) by mouth daily. 30 tablet 3  . umeclidinium bromide (INCRUSE ELLIPTA) 62.5 MCG/INH AEPB Inhale 1 puff into the lungs daily. 30 each 5  . calcium carbonate (TUMS - DOSED IN MG ELEMENTAL CALCIUM) 500 MG chewable tablet Chew 1 tablet by mouth daily as needed for indigestion or heartburn.    . gabapentin (NEURONTIN) 300 MG capsule Take 1 capsule (300 mg total) by mouth 3 (three) times daily. (Patient not taking: Reported on 10/05/2018) 90 capsule 3  . predniSONE (STERAPRED UNI-PAK 21 TAB) 10 MG (21) TBPK tablet Take by mouth daily. Take 6 tabs by mouth daily  for 2 days, then 5 tabs for 2 days,  then 4 tabs for 2 days, then 3 tabs for 2 days, 2 tabs for 2 days, then 1 tab by mouth daily for 2 days 42 tablet 0  . traMADol (ULTRAM) 50 MG tablet Take 1 tablet (50 mg total) by mouth every 8 (eight) hours as needed for up to 5 days. 15 tablet 0  . valACYclovir (VALTREX) 1000 MG tablet Take 1 tablet (1,000 mg total) by mouth 3 (three) times daily. 21 tablet 0   No facility-administered medications prior to visit.      Review of Systems  Constitutional: Negative for fever.  HENT: Negative for sinus pressure, sinus pain, sore throat and trouble swallowing.   Eyes: Negative.   Respiratory: Positive for cough, sputum production, chest tightness, shortness of breath and wheezing. Negative for hemoptysis.   Cardiovascular: Positive for chest pain and PND. Negative for palpitations, orthopnea, claudication, leg swelling and syncope.  Gastrointestinal: Positive for nausea. Negative for vomiting.  Genitourinary: Negative.   Musculoskeletal: Positive for back pain.  Skin: Positive for rash.  Neurological: Negative for headaches.  Psychiatric/Behavioral: Positive for hallucinations. The patient is not nervous/anxious and is not hyperactive.        Objective:   Physical Exam Vitals:   10/05/18 1558  BP: 117/73  Pulse: (!) 102  Resp: 18  Temp: 98.8 F (37.1 C)  TempSrc: Temporal  SpO2: 92%  Weight: 175 lb (79.4 kg)  Height: 5' 9.5" (1.765 m)   Ambulatory pulse ox shows 89 to 92% at room air  Gen: Anxious, well-nourished, in no distress, complaining of pain in the back  ENT: No lesions,  mouth clear,  oropharynx clear, no postnasal drip  Neck: No JVD, no TMG, no carotid bruits  Lungs: No use of accessory muscles, no dullness to percussion, distant  breath sounds bilateral expired wheezes poor breath sounds scattered rhonchi  Cardiovascular: RRR, heart sounds normal, no murmur or gallops, no peripheral edema  Abdomen: soft and NT, no HSM,  BS normal  Musculoskeletal: No  deformities, no cyanosis or clubbing  Neuro: alert, non focal  Skin: Warm, no lesions herpetiform lesions seen in the right lower back in the thoracic lower dermatomes and lumbar dermatomes  7/14 CXR IMPRESSION: Stable mild hazy opacity of right lung base and interval developed patchy opacity of medial left lung base suspicious for pneumonia.  BMP Latest Ref Rng & Units 09/12/2018 09/11/2018 08/11/2018  Glucose 70 - 99 mg/dL 121(H) 139(H) 195(H)  BUN 6 - 20 mg/dL 18 18 16   Creatinine 0.61 - 1.24 mg/dL 0.61 0.87 0.86  BUN/Creat Ratio 9 - 20 - - -  Sodium 135 - 145 mmol/L 138 140 138  Potassium 3.5 - 5.1 mmol/L 4.3 4.3 4.2  Chloride 98 - 111 mmol/L 106 103 102  CO2 22 - 32 mmol/L 24 27 27   Calcium 8.9 - 10.3 mg/dL 8.4(L) 9.0 8.7(L)   Hepatic Function Panel     Component Value Date/Time   PROT 6.0 06/20/2018 1159   ALBUMIN 4.1 06/20/2018 1159   AST 34 06/20/2018 1159   ALT 37 06/20/2018 1159   ALT 62 (H) 06/23/2016 1226   ALKPHOS 43 06/20/2018 1159   BILITOT 0.4 06/20/2018 1159   BILIDIR 0.12 04/16/2016 1601   IBILI NOT CALCULATED 03/26/2016 1845   CBC Latest Ref Rng & Units 09/13/2018 09/12/2018 09/11/2018  WBC 4.0 - 10.5 K/uL 20.2(H) 19.6(H) 26.1(H)  Hemoglobin 13.0 - 17.0 g/dL 13.9 13.8 14.7  Hematocrit 39.0 - 52.0 % 42.5 43.1 44.7  Platelets 150 - 400 K/uL 240 225 239   Chest x-ray reviewed today shows no active process no pneumonia     Assessment & Plan:  I personally reviewed all images and lab data in the [facility] system as well as any outside material available during this office visit and agree with the  radiology impressions.   Post-herpetic polyneuropathy Acute shingles which has resolved however now has postherpetic neuropathy with pain in the lower back  Plan will be to give a trial of amitriptyline 25 mg at bedtime and along with calamine lotion  CAP (community acquired pneumonia) Chest x-ray today shows no evidence of active process and pneumonia has  cleared    COPD exacerbation ([facility]) Recurrent COPD exacerbation  We will prescribe Levaquin 500 mg daily for 5 days and repulse prednisone continue Dulera and Incruse and nebulizer treatments  Hypertension Hypertension well-controlled on losartan will continue same  Alcohol abuse Patient claims he is no longer drinking alcohol at this time   James [name] was seen today for hospitalization follow-up.  Diagnoses and all orders for this visit:  COPD exacerbation ([place]) -     DG Chest 2 View; Future -     DG Chest 2 View  Community acquired pneumonia of right lower lobe of lung -     DG Chest 2 View; Future -     DG Chest 2 View  Essential hypertension  Post-herpetic polyneuropathy  Alcohol abuse  Other orders -     amitriptyline (ELAVIL) 25 MG tablet; Take 1 tablet (25 mg total) by mouth at bedtime. -     ondansetron (ZOFRAN) 4 MG tablet; Take 1 tablet (4 mg total) by mouth every 8 (eight) hours as needed for nausea or vomiting. -     predniSONE (DELTASONE) 10 MG tablet; Take  4 for four days 3 for four days 2 for four days 1 for four days -     levofloxacin (LEVAQUIN) 500 MG tablet; Take 1 tablet (500 mg total) by mouth daily. -     calamine lotion; Apply 1 application topically as needed for itching.

## 2018-10-05 NOTE — Assessment & Plan Note (Signed)
Recurrent COPD exacerbation  We will prescribe Levaquin 500 mg daily for 5 days and repulse prednisone continue Dulera and Incruse and nebulizer treatments

## 2018-10-11 ENCOUNTER — Telehealth: Payer: Self-pay

## 2018-10-11 NOTE — Progress Notes (Signed)
Subjective:    Patient ID: James [name], male    DOB: 01-23-1960, 58 y.o.   MRN: [id number]  58 y.o.M with advanced Copd   I saw this patient a week ago and prescribed antibiotics and prednisone for what I perceived to be persisting pneumonia.  The patient had a slight improvement but then worsened and came to the emergency room the end of the week and was admitted for 3 days for COPD exacerbation and lower lobe pneumonia.  Chest x-ray did show persistent infiltrates in the lower lobes.  The patient is now discharged and returns to the office for post hospital follow-up.  Below is the discharge summary Dc summary : Admit date: 07/31/2018 Discharge date: 08/01/2018  Time spent: 45 minutes  Recommendations for Outpatient Follow-up:  Patient will be discharged to home.  Patient will need to follow up with primary care provider within one week of discharge.  Patient should continue medications as prescribed.  Patient should follow a heart healthy diet.   Discharge Diagnoses:  Acute on chronic respiratory failure with hypoxia secondary to COPD Exacerbation and pneumonia Essential hypertension  Discharge Condition: Stable  Diet recommendation: heart healthy  Filed Weights  07/31/18 0231 07/31/18 0602 Weight: 85.3 kg 83.4 kg   History of present illness:  on 07/31/2018 by Dr. Candie Mile [name] a 58 y.o.malewith medical history significant ofCOPD. Patient has had 3 prior admits for COPD / PNA thus far this year, most recently in May.  Patient has been being treated for COPD exacerbation and BLL CAP as outpatient by [name] with levaquin and prednisone. Just finished levaquin yesterday. Prednisone remained at 40mg  daily with plans to initiate a slow taper.  Despite this, patient developed severe SOB and respiratory distress this evening. EMS called and patient noted to be satting 80% on RA. He is not on chronic O2 for his COPD.  Hospital Course:   Acute on chronic respiratory failure with hypoxia secondary to COPD Exacerbation and pneumonia -Patient noted to have oxygen saturations in the 80s on room air -Chest x-ray noted for pneumonia -Was recently placed on Levaquin and steroids by PCP approximately 1 week ago -On admission, patient continued to have wheezing well into his hospital stay day 1.  Patient currently still has some wheezing however has improved. -Was placed on Solu-Medrol, along with nebulizer treatments, azithromycin and ceftriaxone -Patient did require oxygen upon admission however has been able to ambulate and maintain oxygen saturations in the high 90s on room air. -Will discharge patient with prednisone taper, azithromycin and Ceftin. -Given that patient had oxygen saturations in the 80s on admission, it was thought that he would require several days in the hospital. Patient actually improved quicker than expected.  Essential hypertension -Continue losartan  Since discharge the patient is tried to go back to work but has extreme difficulty in the heat with this.  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  10/05/2018 This patient's not been seen since the end of July he had failed several follow-up visits.  He did see Dr. [name] end of August who did not make major changes in his medication  program.  He is to remain on [name] and Incruse.  The patient after being admitted in July got readmitted early August and then again in early September.  Discharge summaries are as noted below.  During the August admission the patient underwent bronchoscopy and had mucous plugging removed the culture showed normal  flora Note this patient has not been actively smoking recently Admit date: 08/09/2018 Discharge date: 08/11/2018  Discharge Diagnoses:  Acute on chronic hypoxic respiratory failure/sepsis due to multifocal pneumonia COPD Alcohol/marijuana use Essential hypertension  History of present illness:  on 08/09/2018 by Dr. [name] a 58 y.o.malewith medical history significant ofCOPD and ETOH dependence presenting with respiratory distress.He woke up about 530 this AM and couldn't breathe. This just keeps happening. He felt similarly when hospitalized last week. He has been doing fine until today. No h/o OSA, not on CPAP. Not on home O2. +cough, productive of yellowish sputum. It is sometimes hard to get the sputum up. No fever. +wheezing since his last hospitalization. Quit smoking in 2014. He lives in a non-air-conditioned log cabin.  He was hospitalized from 7/27-28 for acute on chronic respiratory failure associated with COPD exacerbation and PNA. This was his 4th admission this year. He was seen by [name] on 7/30 with plan for completion of course of Keflex and prednisone, as well as prednisone taper.  Hospital Course:  Acute on chronic hypoxic respiratory failure/sepsis due to multifocal pneumonia -Was recently hospitalized in late July for pneumonia and COPD exacerbation. Was discharged with antibiotics as well as prednisone taper. Patient followed up with [name] on 08/03/2018 with completion of Keflex and prednisone as well. -This is his fourth admission this year for similar presentation -Patient presented with cough and shortness of breath -Chest x-rayon admissionfindings consistent with right-sided multifocal pneumonia -CTA chest showed multifocal pneumonia on the right. COPD including emphysema. Negative for PE. -COVID negative -Upon admission, patient had leukocytosis, fever, tachycardia, tachypnea- all resolving  -Noted to be  hypoxic 88% on room air in the ED -Urine strep pneumoniaand legionellaantigensnegative -Sputum culture: Few GPC, GVR, rare yeast -Blood cultures show no growth -was placed on vancomycin, Zosyn, Solu-Medrol, supplemental oxygen -S/p bronchoscopy 8/6, pending culture results -CXR obtained after bronch on 8/6: almost complete clearing of the hazy infiltrates in the right lung -Discussed with Dr. Tamala Julian (pulm), recommended prednisone taper starting with prednisone 40mg  daily x 1 week, taper as an outpatient, along with Augmentin. He will follow up with the patient in one week. Given that infection cleared on xray so quickly, question if this is inflammatory vs infection.  -patient able to ambulate today without oxygen and maintained oxygen saturations in the mid 90s- therefore patient does not qualify for home oxygen -patient also had a swallow eval- mild aspiration risk  COPD -Continue nebulizer treatments, steroids, inhalers -Incentive spirometry, flutter valve  Alcohol/marijuana use -Discussed cessation -was placed on continue CIWA protocol  Essential hypertension -Continue Cozaar  Consultants PCCM- pulmonology   Procedures Bronchoscopy   Recommendations for Outpatient Follow-up:  1. Follow up with PCP in 1-2 weeks 2. Please obtain BMP/CBC in one week your next doctors visit.  3. Augmentin and azithromycin for 5 days 4. Advised to continue using bronchodilators every 4-6 hours for the next 4-5 days and then transition to as needed. 5. Advised to take his home prednisone, for next 3 days advised to increase it to 40 mg twice daily then resume back is 20 mg daily  Discharge Condition: Stable CODE STATUS:  Full code Diet recommendation: 2 g salt   DC summary from 09/11/18  Adm 9/7  D/C 09/13/18 Brief/Interim Summary: James [name] a 58 y.o.malewith history ofCOPD on prednisone, HTN, alcohol use disorder, marijuana use,tobacco user. Patient presented secondary to  dyspnea and found to have evidence of pneumonia and a COPD exacerbation in addition to sepsis physiology on admission.  There is concerns of possible right lower lobe pneumonia therefore started on Unasyn and azithromycin due to concerns of aspiration.  This was transitioned to Augmentin and azithromycin for total of 5 days at time of discharge.  Secondary to infection, he was having COPD exacerbation therefore on bronchodilators.  He started improving and on the day of discharge he was adamant about going home as he was feeling much better.  He was still having abnormal breath sounds and cough therefore I had requested him to stay another day in the hospital but still wanted to go home.  Instructions were given to him take 40 mg of prednisone for next 3 days and then he can resume back his 20 mg daily.  Advised... home bronchodilators periodically every 4-6 hours for next 4-5 days and then transition to as needed. Advised to follow-up with his PCP in about 1 week.   Discharge Diagnoses:  Active Problems:   Hypertension   COPD exacerbation ([place])   Marijuana abuse   Pneumonia   Sepsis ([place])  Acute respiratory distress secondary to right lower lobe pneumonia, community-acquired/aspiration Acute mild exacerbation of COPD Concern for possible aspiration.  Unasyn and azithromycin transition to Augmentin and azithromycin.  We will give him 5 more day course at the time of discharge 40 mg of prednisone for next 3 days then transition to 20 mg daily which is his home dose Advised to use bronchodilators.3  Alcohol use disorder Counseled to quit drinking alcohol.  Advised to continue thiamine, folate and multivitamin  Weakness -He did well with physical therapy.  Tobacco use Marijuana use Cessation discussed on admission  Consultations:  None  Subjective: Still having some cough and congestion but he is adamant about going home today as he is feeling much better than from admission.  I  recommended he stay in the hospital for at least 1 more day to help him feel even better.  He wished to go home. Ambulating in the hallway without additional saturating greater than 90% on room air.   Since discharge this patient went to urgent care on 21 September with an outbreak of shingles over his left lower back.  He is taken a course of valacyclovir and prednisone.  He has severe pain.  He was given gabapentin but this caused hallucinations and then we tried tramadol but this caused severe nausea and vomiting.  Apparently the patient cannot take Tylenol or nonsteroidal anti-inflammatories as well.  The patient states his breathing is been labored.  He still has productive cough of white mucus.  He has no fever.  He has significant back pain because of the shingles. The patient is maintaining the [facility] and Incruse as well as his antihypertensive medications and vitamins     10/12/2018  This patient is seen in return follow-up for acute shingles with postherpetic neuropathy, recent pneumonia, COPD exacerbation, and hypertension.  Patient is markedly improved from the last visit.  He has less cough less shortness of breath.  He has improved pain around the area of the shingles.  He is taking amitriptyline 25 mg at bedtime  The patient notes less pain.  He does state he had been using the tramadol without nausea or vomiting.  I asked if patient would be interested in the para medicine program and expressed interest in the community para medicine program  Shortness of Breath This is a chronic problem. The current episode started more than 1 year ago. The problem occurs daily (qhs shortness of breath). The problem has been gradually worsening. Associated symptoms include PND, a rash and sputum production. Pertinent negatives include no chest pain, claudication, fever, headaches, hemoptysis, leg pain, leg swelling, orthopnea, sore throat, syncope, vomiting or wheezing. The symptoms are aggravated  by any activity and weather changes. Associated symptoms comments: Pain radiates to arm Legs go to sleep No dysphagia No gerd . Risk factors include smoking. He has tried beta agonist inhalers, oral steroids and steroid inhalers for the symptoms. The treatment provided moderate relief. His past medical history is significant for COPD and pneumonia. There is no history of asthma, CAD, DVT, a heart failure or PE.    Past Medical History:  Diagnosis Date  . Acute on chronic respiratory failure with hypoxia ([place]) 06/08/2013  . CAP (community acquired pneumonia) 05/17/2018   See admit 05/17/18 ? rml  with   covid pcr neg - rx augmentin > f/u cxr in 4-6 weeks is fine unless condition declines   . COPD (chronic obstructive pulmonary disease) ([place])    not on home  . Dyspnea   . Hypertension   . Pneumonia 04/06/2016  . Pneumothorax    2016, fell from ladder  . Tick bite 06/03/2018     Family History  Problem Relation Age of Onset  . Heart disease Father   . COPD Sister      Social History   Socioeconomic History  . Marital status: Divorced    Spouse name: Not on file  . Number of children: Not on file  . Years of education: Not on file  . Highest education level: Not on file  Occupational History  . Occupation: Dealer  . Occupation: [employer]  . Financial resource strain: Hard  . Food insecurity    Worry: Not on file    Inability: Never true  . Transportation needs    Medical: No    Non-medical: No  Tobacco Use  . Smoking status: Former Smoker    Packs/day: 1.50    Years: 40.00    Pack years: 60.00    Types: Cigarettes    Quit date: 2014    Years since quitting: 6.7  . Smokeless tobacco: Current User    Types: Chew  Substance and Sexual Activity  . Alcohol use: Yes    Alcohol/week: 28.0 standard drinks    Types: 28 Cans of beer per week    Comment: 4 beers a night; + jitters with not drinking, denies DTs or seizures  . Drug use: Yes    Types: Marijuana     Comment: daily; quit using crack and methamphetamine about 5 months ago   . Sexual activity: Not Currently    Partners: Female  Lifestyle  . Physical activity    Days per week: 0 days    Minutes per session: 0 min  . Stress: To some extent  Relationships  . Social Herbalist on phone: Three times a week    Gets together: Once a week    Attends religious service: 1 to 4 times per year    Active member of club or organization: Yes  Attends meetings of clubs or organizations: 1 to 4 times per year    Relationship status: Divorced  . Intimate partner violence    Fear of current or ex partner: Not on file    Emotionally abused: Not on file    Physically abused: Not on file    Forced sexual activity: Not on file  Other Topics Concern  . Not on file  Social History Narrative   Lives alone near [place]     Allergies  Allergen Reactions  . Gabapentin Other (See Comments)    hallucinations     Outpatient Medications Prior to Visit  Medication Sig Dispense Refill  . albuterol (PROVENTIL HFA;VENTOLIN HFA) 108 (90 Base) MCG/ACT inhaler Inhale 1-2 puffs into the lungs every 6 (six) hours as needed for wheezing or shortness of breath. 1 Inhaler 5  . albuterol (PROVENTIL) (2.5 MG/3ML) 0.083% nebulizer solution Take 3 mLs (2.5 mg total) by nebulization every 6 (six) hours as needed for wheezing or shortness of breath. 150 mL 1  . amitriptyline (ELAVIL) 25 MG tablet Take 1 tablet (25 mg total) by mouth at bedtime. 30 tablet 1  . calamine lotion Apply 1 application topically as needed for itching. 875 mL 0  . folic acid (FOLVITE) 1 MG tablet Take 1 tablet (1 mg total) by mouth daily. 90 tablet 1  . losartan (COZAAR) 100 MG tablet TAKE 1 TABLET (100 MG TOTAL) BY MOUTH DAILY. 30 tablet 2  . mometasone-formoterol (DULERA) 200-5 MCG/ACT AERO Inhale 2 puffs into the lungs 2 (two) times daily. 13 g 2  . ondansetron (ZOFRAN) 4 MG tablet Take 1 tablet (4 mg total) by mouth every 8  (eight) hours as needed for nausea or vomiting. 20 tablet 0  . predniSONE (DELTASONE) 10 MG tablet Take 4 for four days 3 for four days 2 for four days 1 for four days 40 tablet 0  . Respiratory Therapy Supplies (FLUTTER) DEVI Use 4 times daily 1 each 0  . Spacer/Aero-Holding Chambers (AEROCHAMBER MV) inhaler Use as instructed 1 each 0  . thiamine (VITAMIN B-1) 100 MG tablet Take 1 tablet (100 mg total) by mouth daily. 30 tablet 3  . umeclidinium bromide (INCRUSE ELLIPTA) 62.5 MCG/INH AEPB Inhale 1 puff into the lungs daily. 30 each 5  . levofloxacin (LEVAQUIN) 500 MG tablet Take 1 tablet (500 mg total) by mouth daily. 5 tablet 0   No facility-administered medications prior to visit.      Review of Systems  Constitutional: Negative for fever.  HENT: Negative for sinus pressure, sinus pain, sore throat and trouble swallowing.   Eyes: Negative.   Respiratory: Positive for cough, sputum production, chest tightness and shortness of breath. Negative for hemoptysis and wheezing.   Cardiovascular: Positive for PND. Negative for chest pain, palpitations, orthopnea, claudication, leg swelling and syncope.  Gastrointestinal: Negative for nausea and vomiting.  Genitourinary: Negative.   Musculoskeletal: Positive for back pain.  Skin: Positive for rash.  Neurological: Negative for headaches.  Psychiatric/Behavioral: Negative for hallucinations. The patient is not nervous/anxious and is not hyperactive.        Objective:   Physical Exam Vitals:   10/12/18 1350  BP: (!) 143/76  Pulse: (!) 113  Resp: 18  Temp: (!) 97.5 F (36.4 C)  TempSrc: Temporal  SpO2: 96%  Weight: 179 lb 3.2 oz (81.3 kg)    Gen: , well-nourished, in no distress,   ENT: No lesions,  mouth clear,  oropharynx clear, no postnasal drip  Neck: No  JVD, no TMG, no carotid bruits  Lungs: No use of accessory muscles, no dullness to percussion, distant breath sounds bilateral , decreased rhonchi  Cardiovascular: RRR,  heart sounds normal, no murmur or gallops, no peripheral edema  Abdomen: soft and NT, no HSM,  BS normal  Musculoskeletal: No deformities, no cyanosis or clubbing  Neuro: alert, non focal  Skin: Warm, no lesions herpetiform lesions seen in the right lower back in the thoracic lower dermatomes and lumbar dermatomes which are now drying   BMP Latest Ref Rng & Units 09/12/2018 09/11/2018 08/11/2018  Glucose 70 - 99 mg/dL 121(H) 139(H) 195(H)  BUN 6 - 20 mg/dL 18 18 16   Creatinine 0.61 - 1.24 mg/dL 0.61 0.87 0.86  BUN/Creat Ratio 9 - 20 - - -  Sodium 135 - 145 mmol/L 138 140 138  Potassium 3.5 - 5.1 mmol/L 4.3 4.3 4.2  Chloride 98 - 111 mmol/L 106 103 102  CO2 22 - 32 mmol/L 24 27 27   Calcium 8.9 - 10.3 mg/dL 8.4(L) 9.0 8.7(L)   Hepatic Function Panel     Component Value Date/Time   PROT 6.0 06/20/2018 1159   ALBUMIN 4.1 06/20/2018 1159   AST 34 06/20/2018 1159   ALT 37 06/20/2018 1159   ALT 62 (H) 06/23/2016 1226   ALKPHOS 43 06/20/2018 1159   BILITOT 0.4 06/20/2018 1159   BILIDIR 0.12 04/16/2016 1601   IBILI NOT CALCULATED 03/26/2016 1845   CBC Latest Ref Rng & Units 09/13/2018 09/12/2018 09/11/2018  WBC 4.0 - 10.5 K/uL 20.2(H) 19.6(H) 26.1(H)  Hemoglobin 13.0 - 17.0 g/dL 13.9 13.8 14.7  Hematocrit 39.0 - 52.0 % 42.5 43.1 44.7  Platelets 150 - 400 K/uL 240 225 239   Chest x-ray reviewed today shows no active process no pneumonia     Assessment & Plan:  I personally reviewed all images and lab data in the [facility] system as well as any outside material available during this office visit and agree with the  radiology impressions.   COPD exacerbation (HCC) Recent COPD exacerbation which is now resolved  The patient will continue inhaled medications as prescribed and does not need further prednisone or antibiotics  Post-herpetic polyneuropathy Postherpetic neuropathy improved on amitriptyline we will continue this at 25 mg bedtime

## 2018-10-11 NOTE — Telephone Encounter (Signed)
paramedicine referral requested by [name] has been approved by [facility].    Call placed to patient's sister, [name] to discuss paramedicine referral.  Call back requested to this CM # [phone]

## 2018-10-11 NOTE — Progress Notes (Signed)
Patient notified of xray results during office visit.

## 2018-10-11 NOTE — Telephone Encounter (Signed)
Called patient to do their pre-visit COVID screening.  Call went to voicemail. Unable to do prescreening.  

## 2018-10-12 ENCOUNTER — Ambulatory Visit (INDEPENDENT_AMBULATORY_CARE_PROVIDER_SITE_OTHER): Payer: Self-pay | Admitting: Critical Care Medicine

## 2018-10-12 ENCOUNTER — Other Ambulatory Visit: Payer: Self-pay

## 2018-10-12 VITALS — BP 143/76 | HR 113 | Temp 97.5°F | Resp 18 | Wt 179.2 lb

## 2018-10-12 DIAGNOSIS — J449 Chronic obstructive pulmonary disease, unspecified: Secondary | ICD-10-CM

## 2018-10-12 DIAGNOSIS — J441 Chronic obstructive pulmonary disease with (acute) exacerbation: Secondary | ICD-10-CM

## 2018-10-12 DIAGNOSIS — I10 Essential (primary) hypertension: Secondary | ICD-10-CM

## 2018-10-12 DIAGNOSIS — B0223 Postherpetic polyneuropathy: Secondary | ICD-10-CM

## 2018-10-12 NOTE — Telephone Encounter (Signed)
Call placed to patient's sister, [name] and explained community paramedicine program to her. She was in agreement to have the paramedic contact her to schedule the visit.  This information was shared with Bradd Canary, EMT and Monia Pouch.

## 2018-10-12 NOTE — Assessment & Plan Note (Signed)
Postherpetic neuropathy improved on amitriptyline we will continue this at 25 mg bedtime

## 2018-10-12 NOTE — Patient Instructions (Signed)
Stay on your inhaled medicines as prescribed  You declined a flu vaccine  No further prednisone or antibiotics  Continue the amitriptyline at bedtime this will help the pain from the shingles  You agreed to the para medicine program they will be in touch with you  Return to see [name] 1 month

## 2018-10-12 NOTE — Assessment & Plan Note (Signed)
Recent COPD exacerbation which is now resolved  The patient will continue inhaled medications as prescribed and does not need further prednisone or antibiotics

## 2018-10-23 ENCOUNTER — Telehealth: Payer: Self-pay

## 2018-10-23 ENCOUNTER — Other Ambulatory Visit (HOSPITAL_COMMUNITY): Payer: Self-pay

## 2018-10-23 NOTE — Telephone Encounter (Signed)
Another call received from [facility].  She said that patient's sister called her after she spoke to this CM earlier. His sister reported multiple concerns about patient's housing noting that the environment " bad." . There are floor boards missing and neighbors' cats come in through the floor of the home.  There is mold in the home. 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 The sister said that the patient is worried about getting [name] in trouble.  Joellen Jersey said that she met with the patient outside of the house today as there were men working on the house.  Next week, she will plan to meet the patient in the house to assess the environment.

## 2018-10-23 NOTE — Progress Notes (Signed)
Paramedicine Encounter    Patient ID: James [name], male    DOB: Oct 19, 1960, 58 y.o.   MRN: [id number]   Patient Care Team: Scot Jun, FNP as PCP - General (Family Medicine) O'Neal, Cassie Freer, MD as PCP - Cardiology (Cardiology)  Patient Active Problem List   Diagnosis Date Noted  . 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 Marijuana abuse 08/09/2018  . Hypertension 05/17/2016  . Low back pain radiating to both legs 04/18/2016  . History of tobacco abuse 04/06/2016  . Alcohol abuse 04/06/2016  . COPD mixed type ([place]) 03/26/2016    Current Outpatient Medications:  .  albuterol (PROVENTIL HFA;VENTOLIN HFA) 108 (90 Base) MCG/ACT inhaler, Inhale 1-2 puffs into the lungs every 6 (six) hours as needed for wheezing or shortness of breath., Disp: 1 Inhaler, Rfl: 5 .  albuterol (PROVENTIL) (2.5 MG/3ML) 0.083% nebulizer solution, Take 3 mLs (2.5 mg total) by nebulization every 6 (six) hours as needed for wheezing or shortness of breath., Disp: 150 mL, Rfl: 1 .  calamine lotion, Apply 1 application topically as needed for itching., Disp: 120 mL, Rfl: 0 .  folic acid (FOLVITE) 1 MG tablet, Take 1 tablet (1 mg total) by mouth daily., Disp: 90 tablet, Rfl: 1 .  losartan (COZAAR) 100 MG tablet, TAKE 1 TABLET (100 MG TOTAL) BY MOUTH DAILY., Disp: 30 tablet, Rfl: 2 .  mometasone-formoterol (DULERA) 200-5 MCG/ACT AERO, Inhale 2 puffs into the lungs 2 (two) times daily., Disp: 13 g, Rfl: 2 .  ondansetron (ZOFRAN) 4 MG tablet, Take 1 tablet (4 mg total) by mouth every 8 (eight) hours as needed for nausea or vomiting., Disp: 20 tablet, Rfl: 0 .  Respiratory Therapy Supplies (FLUTTER) DEVI, Use 4 times daily, Disp: 1 each, Rfl: 0 .  Spacer/Aero-Holding Chambers (AEROCHAMBER MV) inhaler, Use as instructed, Disp: 1 each, Rfl: 0 .  thiamine (VITAMIN B-1) 100 MG tablet, Take 1 tablet (100 mg total) by mouth daily., Disp: 30 tablet, Rfl: 3 .  umeclidinium bromide (INCRUSE ELLIPTA) 62.5 MCG/INH  AEPB, Inhale 1 puff into the lungs daily., Disp: 30 each, Rfl: 5 .  amitriptyline (ELAVIL) 25 MG tablet, Take 1 tablet (25 mg total) by mouth at bedtime. (Patient not taking: Reported on 10/23/2018), Disp: 30 tablet, Rfl: 1 .  predniSONE (DELTASONE) 10 MG tablet, Take 4 for four days 3 for four days 2 for four days 1 for four days (Patient not taking: Reported on 10/23/2018), Disp: 40 tablet, Rfl: 0 Allergies  Allergen Reactions  . Gabapentin Other (See Comments)    hallucinations      Social History   Socioeconomic History  . Marital status: Divorced    Spouse name: Not on file  . Number of children: Not on file  . Years of education: Not on file  . Highest education level: Not on file  Occupational History  . Occupation: Dealer  . Occupation: [employer]  . Financial resource strain: Hard  . Food insecurity    Worry: Not on file    Inability: Never true  . Transportation needs    Medical: No    Non-medical: No  Tobacco Use  . Smoking status: Former Smoker    Packs/day: 1.50    Years: 40.00    Pack years: 60.00    Types: Cigarettes    Quit date: 2014    Years since quitting: 6.8  . Smokeless tobacco: Current User    Types: Chew  Substance and Sexual Activity  . Alcohol use:  Yes    Alcohol/week: 28.0 standard drinks    Types: 28 Cans of beer per week    Comment: 4 beers a night; + jitters with not drinking, denies DTs or seizures  . Drug use: Yes    Types: Marijuana    Comment: daily; quit using crack and methamphetamine about 5 months ago   . Sexual activity: Not Currently    Partners: Female  Lifestyle  . Physical activity    Days per week: 0 days    Minutes per session: 0 min  . Stress: To some extent  Relationships  . Social Herbalist on phone: Three times a week    Gets together: Once a week    Attends religious service: 1 to 4 times per year    Active member of club or organization: Yes    Attends meetings of clubs or  organizations: 1 to 4 times per year    Relationship status: Divorced  . Intimate partner violence    Fear of current or ex partner: Not on file    Emotionally abused: Not on file    Physically abused: Not on file    Forced sexual activity: Not on file  Other Topics Concern  . Not on file  Social History Narrative   Lives alone near [place]    Physical Exam      Future Appointments  Date Time Provider [place]  11/23/2018  1:30 PM PCE-COVERING PROVIDER PCE-PCE None    BP (!) 153/94   Pulse (!) 114   Resp 20   SpO2 94%   I spoke to sister last week to sch home visit. She reported that I could meet him here at his address and she would accompany him. When I arrived pt was not here, there were 2 guys here working on his house and they called him to get him to come here. Someone brought him there and then his sister arrived too.   He reports he gets sob upon exertion. He reports no sob upon rest.  He is working on getting disability and medicaid.  Sister is taking him next week to meet with social worker off [address]  Pt reports increased heartburn lately.  Sister reports he has cardiologist and needs to sch appointment.  meds reviewed--he is not taking the amitripyline-it was causing him nausea and abd pains.  He stopped taking that a few wks.  Pt reports he is done with the prednisone and he wants more of it.  Pt denies dizziness, no h/a.  Pt denies smoking in 6 yrs.  His v/s are elevated. His prednisone causes him to be very jittery per the sister.  ----sister called me shortly after visit to share her concerns over his living situation-- Pt did not want me going inside as mentioned above. Sister states pt had a rough life. He abused drugs and alcohol secondary from having a rough childhood.  He still does drink and he can get mean when he drinks.  She reports those guys were there b/c they thought I was with social services to look at his house.  Sister  reports that the floors have holes in them, there was standing water in floor, holes in walls/windows-critters and neighbors cats come in and use bathroom on the rugs--they covered the floors with boards to cover up the horrible stains on the carpets.  He recently got a hot water heater and had been having to take cold showers. Only heat  source is a Furniture conservator/restorer.  She reports the ceilings are black with mildew and possible black mold. The "landlord" is a man that took patient in when he was 89 yrs old. Sister reports he is very scared of this man. She reports he is staying at that house rent free and free of utility bills and gets $75 a week. She said the plumbing burst and there was septic waste under house and had come through the holes in the floors.  That man was the one that picked him up and brought him to the house. He stayed very close to pt during visit. 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  New plan is I will come to the house next week and I will be sure to go inside and have a look around.  I will get eyes in that house next week.   [name], [facility] Paramedic  10/23/18

## 2018-10-23 NOTE — Telephone Encounter (Signed)
Call received from [facility].  She explained that she met with the patient for the first time today.His sister was present for the meeting.  Katie reported that he has not been taking his amitriptyline because it causes nausea.  He also stated that his last dose of prednisone is today and he feels that he needs to keep taking this.  BP: 150/94; P: 115 and he was very jittery. His sister said that he is jittery when on prednisone.    His sister is still trying to assist him with the medicaid and disability applications.

## 2018-10-24 MED ORDER — PREDNISONE 10 MG PO TABS: ORAL_TABLET | ORAL | 0 refills | Status: DC

## 2018-10-24 NOTE — Telephone Encounter (Signed)
Thank you Opal Sidles,  I am concerned about the housing situation as well  I am out on PAL,  Can you call the patient or sister and let them know I sent another prednisone Rx to our pharmacy

## 2018-10-25 MED FILL — predniSONE 10 MG TABS: 10 | 16 days supply | Qty: 40 | Fill #0

## 2018-10-25 NOTE — Telephone Encounter (Signed)
Call placed to patient's sister, [name] and informed her that [name] has placed the order for prednisone.  She said that her brother was on his way to the clinic to pick up the medication.

## 2018-10-30 MED FILL — ?FOLIC ACID 1MG TAB: 1 | 30 days supply | Qty: 30 | Fill #1

## 2018-10-30 MED FILL — !INCRUSE ELLIPTA 62.5 MCG I: 62.5 | 30 days supply | Qty: 30 | Fill #2

## 2018-10-30 MED FILL — LOSARTAN POTASSIUM 100 MG T: 100 | 30 days supply | Qty: 30 | Fill #1

## 2018-10-31 ENCOUNTER — Other Ambulatory Visit (HOSPITAL_COMMUNITY): Payer: Self-pay

## 2018-10-31 NOTE — Progress Notes (Signed)
Paramedicine Encounter    Patient ID: James [name], male    DOB: [birth year], 59 y.o.   MRN: [id number]   Patient Care Team: Scot Jun, FNP as PCP - General (Family Medicine) O'Neal, Cassie Freer, MD as PCP - Cardiology (Cardiology)  Patient Active Problem List   Diagnosis Date Noted  . 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 Marijuana abuse 08/09/2018  . Hypertension 05/17/2016  . Low back pain radiating to both legs 04/18/2016  . History of tobacco abuse 04/06/2016  . Alcohol abuse 04/06/2016  . COPD mixed type ([place]) 03/26/2016    Current Outpatient Medications:  .  albuterol (PROVENTIL HFA;VENTOLIN HFA) 108 (90 Base) MCG/ACT inhaler, Inhale 1-2 puffs into the lungs every 6 (six) hours as needed for wheezing or shortness of breath., Disp: 1 Inhaler, Rfl: 5 .  albuterol (PROVENTIL) (2.5 MG/3ML) 0.083% nebulizer solution, Take 3 mLs (2.5 mg total) by nebulization every 6 (six) hours as needed for wheezing or shortness of breath., Disp: 150 mL, Rfl: 1 .  calamine lotion, Apply 1 application topically as needed for itching., Disp: 120 mL, Rfl: 0 .  folic acid (FOLVITE) 1 MG tablet, Take 1 tablet (1 mg total) by mouth daily., Disp: 90 tablet, Rfl: 1 .  losartan (COZAAR) 100 MG tablet, TAKE 1 TABLET (100 MG TOTAL) BY MOUTH DAILY., Disp: 30 tablet, Rfl: 2 .  mometasone-formoterol (DULERA) 200-5 MCG/ACT AERO, Inhale 2 puffs into the lungs 2 (two) times daily., Disp: 13 g, Rfl: 2 .  predniSONE (DELTASONE) 10 MG tablet, Take 4 for four days 3 for four days 2 for four days 1 for four days, Disp: 40 tablet, Rfl: 0 .  Respiratory Therapy Supplies (FLUTTER) DEVI, Use 4 times daily, Disp: 1 each, Rfl: 0 .  Spacer/Aero-Holding Chambers (AEROCHAMBER MV) inhaler, Use as instructed, Disp: 1 each, Rfl: 0 .  thiamine (VITAMIN B-1) 100 MG tablet, Take 1 tablet (100 mg total) by mouth daily., Disp: 30 tablet, Rfl: 3 .  umeclidinium bromide (INCRUSE ELLIPTA) 62.5 MCG/INH AEPB, Inhale 1 puff  into the lungs daily., Disp: 30 each, Rfl: 5 .  amitriptyline (ELAVIL) 25 MG tablet, Take 1 tablet (25 mg total) by mouth at bedtime. (Patient not taking: Reported on 10/23/2018), Disp: 30 tablet, Rfl: 1 .  ondansetron (ZOFRAN) 4 MG tablet, Take 1 tablet (4 mg total) by mouth every 8 (eight) hours as needed for nausea or vomiting. (Patient not taking: Reported on 10/31/2018), Disp: 20 tablet, Rfl: 0 Allergies  Allergen Reactions  . Gabapentin Other (See Comments)    hallucinations      Social History   Socioeconomic History  . Marital status: Divorced    Spouse name: Not on file  . Number of children: Not on file  . Years of education: Not on file  . Highest education level: Not on file  Occupational History  . Occupation: Dealer  . Occupation: [employer]  . Financial resource strain: Hard  . Food insecurity    Worry: Not on file    Inability: Never true  . Transportation needs    Medical: No    Non-medical: No  Tobacco Use  . Smoking status: Former Smoker    Packs/day: 1.50    Years: 40.00    Pack years: 60.00    Types: Cigarettes    Quit date: 2014    Years since quitting: 6.8  . Smokeless tobacco: Current User    Types: Chew  Substance and Sexual Activity  . Alcohol use:  Yes    Alcohol/week: 28.0 standard drinks    Types: 28 Cans of beer per week    Comment: 4 beers a night; + jitters with not drinking, denies DTs or seizures  . Drug use: Yes    Types: Marijuana    Comment: daily; quit using crack and methamphetamine about 5 months ago   . Sexual activity: Not Currently    Partners: Female  Lifestyle  . Physical activity    Days per week: 0 days    Minutes per session: 0 min  . Stress: To some extent  Relationships  . Social Herbalist on phone: Three times a week    Gets together: Once a week    Attends religious service: 1 to 4 times per year    Active member of club or organization: Yes    Attends meetings of clubs or  organizations: 1 to 4 times per year    Relationship status: Divorced  . Intimate partner violence    Fear of current or ex partner: Not on file    Emotionally abused: Not on file    Physically abused: Not on file    Forced sexual activity: Not on file  Other Topics Concern  . Not on file  Social History Narrative   Lives alone near [place]    Physical Exam      Future Appointments  Date Time Provider [place]  11/23/2018  1:30 PM PCE-COVERING PROVIDER PCE-PCE None    BP 130/80   Pulse 92   Temp 97.7 F (36.5 C)   Resp 20   SpO2 95%   F/u visit with pt this week. Pts sister also arrived as well. Pt agreed to let me inside this time.  Pt denies c/p, no dizziness.  He had been sanding today, his face was covered with dirt, he said he wore a mask but his face had no outline of him wearing a protective mask.  No wheezing noted however he is not moving normal amount of air in lungs. He denies sob at this time as he is at rest.  He still gets sob. He does use his inhaler and neb tx.  Upon arrival, the outside of the front of the house was in tact, however upon walking around the back of the house-the back yard had obviously had a leak in the recent past-yard was soaked, it had been covered with dirt. The foundation of the house was not intact in the back--there was a large gap and standing water up under the house. Was able to see inside through the back windows--with permission--the back rooms of the house had no flooring in them-no sub floor it was just straight to the ground. There was a hot water heater hanging from the hook up--still hooked up--but again no flooring up under it to hold it.  There was puddles of standing water in those other rooms.  The door way from the kitchen and living room had been open to this part of the house where the cold, damp mildewed back rooms and other bathroom air could come directly into the part he is living in. This has been covered  by the "landlord" by placing a very thin piece of paneling up and its not even been sealed around it. Cold, damp air can still come through. You can still smell the wet dirt. Sister said it had smelled like sewage before they had dirt covered it up.  The piece mentioned above  was done at the visit last week that I was not allowed to go inside to see--they thought I was from [place] and coming to look at house.  All this cannot be good for his lung condition.  Very concerned with these living conditions.  I can see now how critters and neighbors cats got in. He reports the cat came in at one time and had babies in his dresser drawer. He said this house has been like this for a few months but sister says years. By the looks of things, these conditions did not occur recently and very likely he is not saying accurately how long as he does not want to make the "landlord" mad at him or lose his house.  For the winter, his plan is that the landlord is going to get him an amish heater to use.  [name] was not here today during the visit and pt was much more at calm and less jittery today with just me and his sister.  His sister vouches that he is very anxious around this darryl  his "landlord" and the one that took him in when he was 58 y/o.   [name], [facility] Paramedic  11/02/18

## 2018-11-02 ENCOUNTER — Encounter: Payer: Self-pay | Admitting: Critical Care Medicine

## 2018-11-02 ENCOUNTER — Telehealth: Payer: Self-pay

## 2018-11-02 NOTE — Telephone Encounter (Signed)
Met with Monia Pouch who described the status of patient's home and living conditions.  She shared the pictures and reported the conditions to [name].   Message left for [facility] requesting call back to this CM.  Need to discuss patient's living conditions.   This CM spoke to patient's sister, [name], who explained that she is concerned about patient having heat this winter. She said the landlord is now planning to replace the flooring/carpet and has wood to make general home repairs. She is also concerned about the water heater leaking and causing a flood. There is a room in the house that has no flooring at the present time and is open to the ground.  [name] was in agreement to having this CM contact legal aid of [place] to inquire if they can assist with the housing issues/conditions. She would need to be the contact person for her brother.

## 2018-11-02 NOTE — Progress Notes (Signed)
Patient ID: [name], male   DOB: 1960/04/10, 58 y.o.   MRN: [id number]  I had a conversation with Roby Lofts our nurse case manager and our ENT [name] who is our para medicine who has been visiting [name] at his home.  Below are photographs that she is taken of the housing conditions.  In general the foundation is cracked there is water standing into the house their floors that are missing there is dirt exposed and multiple areas of mold growing throughout the house.  Conditions are not suitable for habitation particular given the patient's hypersensitivity conditions.  Plan here will be to follow-up with social services to see if we can get this patient into his improved housing  See photographs below and those in the media section  [place]  11/02/2018 14:42  Attached To:  [place], MD  [facility]   [place]  11/02/2018 14:43  Attached To:  [name]  Source Information  [name], MD  [facility]   Media Information    Document Information  Photos    11/02/2018 14:43  Attached To:  [name]  Source Information  [name], MD  [facility]   Media Information    Document Information  Photos    11/02/2018 14:43  Attached To:  [place], MD  [facility]   [place]  11/02/2018 14:44  Attached To:  [name]  Source Information  [name], MD  [facility]   Media Information

## 2018-11-06 ENCOUNTER — Telehealth: Payer: Self-pay

## 2018-11-06 NOTE — Telephone Encounter (Signed)
Yes, per our conversation Friday: please contact Legal Aid and go through the sister.  Also, I spoke to [employer] Friday and she will also help [place] by speaking to Legal Aid herself to determine best course of action

## 2018-11-06 NOTE — Telephone Encounter (Signed)
Message received from Eustace Quail, [employer] [facility] [place] requesting call back regarding referral.  Call returned and message left with call back requested to this CM.

## 2018-11-07 ENCOUNTER — Telehealth: Payer: Self-pay | Admitting: Critical Care Medicine

## 2018-11-07 ENCOUNTER — Telehealth: Payer: Self-pay

## 2018-11-07 NOTE — Telephone Encounter (Signed)
1) Medication(s) Requested (by name): predniSONE (DELTASONE) 10 MG tablet [[id number]]   2) Pharmacy of Choice: [facility] [place] [address]   Patient was Educational psychologist for Education officer, museum to do a home inspection. Patient was cleaning exterior of home with bleach and did not know that it would affect him this badly.    Approved medications will be sent to pharmacy, we will reach out to you if there is an issue.  Requests made after 3pm may not be addressed until following business day!

## 2018-11-07 NOTE — Telephone Encounter (Signed)
Call placed to [facility]/[employer] [facility] [place].  Explained reason for referral and patient's current housing situation as well as concerns for managing his health with his current living environment.  She explained that she will contact his sister, [name], and obtain additional information and discuss the case with their housing attorney to develop a plan for assistance/guidance with the situation.

## 2018-11-07 NOTE — Telephone Encounter (Signed)
Unable to refill Prednisone at that dose. He was on a higher dose because he had pneumonia which was worsening his COPD.  Patient will need to be evaluated. He can go to the urgent care next door or the main one if that is closer.

## 2018-11-08 ENCOUNTER — Telehealth: Payer: Self-pay

## 2018-11-08 ENCOUNTER — Other Ambulatory Visit (HOSPITAL_COMMUNITY): Payer: Self-pay

## 2018-11-08 NOTE — Telephone Encounter (Signed)
Call received from [name], EMT. She reported that the patient has completed the course of prednisone. She said that the hot water heater in the home has been replaced.  His sister,[name], received a message from Legal Aid and needs to return the call.  Joellen Jersey has instructed [name] regarding the importance of returning that call.   Joellen Jersey stated that the patient's sister never received the certified document from medical records that is needed to obtain a new social security card for patient.   As per [facility] records the document was sent to patient's sister 10/04/2018 after confirming the address with her.  Will need to request another copy. Joellen Jersey will confirm where [name] would like the document sent. The patient currently has no ID which  is necessary to apply for [employer] [place] and [employer] as well as medicaid and disability.

## 2018-11-08 NOTE — Progress Notes (Signed)
Paramedicine Encounter    Patient ID: OMARIO ANDER, male    DOB: 1960/04/18, 58 y.o.   MRN: [id number]   Patient Care Team: [name], MD as PCP - General (Pulmonary Disease) O'Neal, Cassie Freer, MD as PCP - Cardiology (Cardiology)  Patient Active Problem List   Diagnosis Date Noted  . 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 Marijuana abuse 08/09/2018  . Hypertension 05/17/2016  . Low back pain radiating to both legs 04/18/2016  . History of tobacco abuse 04/06/2016  . Alcohol abuse 04/06/2016  . COPD mixed type ([place]) 03/26/2016    Current Outpatient Medications:  .  albuterol (PROVENTIL HFA;VENTOLIN HFA) 108 (90 Base) MCG/ACT inhaler, Inhale 1-2 puffs into the lungs every 6 (six) hours as needed for wheezing or shortness of breath., Disp: 1 Inhaler, Rfl: 5 .  albuterol (PROVENTIL) (2.5 MG/3ML) 0.083% nebulizer solution, Take 3 mLs (2.5 mg total) by nebulization every 6 (six) hours as needed for wheezing or shortness of breath., Disp: 150 mL, Rfl: 1 .  calamine lotion, Apply 1 application topically as needed for itching., Disp: 120 mL, Rfl: 0 .  folic acid (FOLVITE) 1 MG tablet, Take 1 tablet (1 mg total) by mouth daily., Disp: 90 tablet, Rfl: 1 .  losartan (COZAAR) 100 MG tablet, TAKE 1 TABLET (100 MG TOTAL) BY MOUTH DAILY., Disp: 30 tablet, Rfl: 2 .  mometasone-formoterol (DULERA) 200-5 MCG/ACT AERO, Inhale 2 puffs into the lungs 2 (two) times daily., Disp: 13 g, Rfl: 2 .  predniSONE (DELTASONE) 10 MG tablet, Take 4 for four days 3 for four days 2 for four days 1 for four days, Disp: 40 tablet, Rfl: 0 .  Respiratory Therapy Supplies (FLUTTER) DEVI, Use 4 times daily, Disp: 1 each, Rfl: 0 .  Spacer/Aero-Holding Chambers (AEROCHAMBER MV) inhaler, Use as instructed, Disp: 1 each, Rfl: 0 .  thiamine (VITAMIN B-1) 100 MG tablet, Take 1 tablet (100 mg total) by mouth daily., Disp: 30 tablet, Rfl: 3 .  umeclidinium bromide (INCRUSE ELLIPTA) 62.5 MCG/INH AEPB, Inhale 1 puff  into the lungs daily., Disp: 30 each, Rfl: 5 .  amitriptyline (ELAVIL) 25 MG tablet, Take 1 tablet (25 mg total) by mouth at bedtime. (Patient not taking: Reported on 10/23/2018), Disp: 30 tablet, Rfl: 1 .  ondansetron (ZOFRAN) 4 MG tablet, Take 1 tablet (4 mg total) by mouth every 8 (eight) hours as needed for nausea or vomiting. (Patient not taking: Reported on 10/31/2018), Disp: 20 tablet, Rfl: 0 Allergies  Allergen Reactions  . Gabapentin Other (See Comments)    hallucinations      Social History   Socioeconomic History  . Marital status: Divorced    Spouse name: Not on file  . Number of children: Not on file  . Years of education: Not on file  . Highest education level: Not on file  Occupational History  . Occupation: Dealer  . Occupation: [employer]  . Financial resource strain: Hard  . Food insecurity    Worry: Not on file    Inability: Never true  . Transportation needs    Medical: No    Non-medical: No  Tobacco Use  . Smoking status: Former Smoker    Packs/day: 1.50    Years: 40.00    Pack years: 60.00    Types: Cigarettes    Quit date: 2014    Years since quitting: 6.8  . Smokeless tobacco: Current User    Types: Chew  Substance and Sexual Activity  . Alcohol use:  Yes    Alcohol/week: 28.0 standard drinks    Types: 28 Cans of beer per week    Comment: 4 beers a night; + jitters with not drinking, denies DTs or seizures  . Drug use: Yes    Types: Marijuana    Comment: daily; quit using crack and methamphetamine about 5 months ago   . Sexual activity: Not Currently    Partners: Female  Lifestyle  . Physical activity    Days per week: 0 days    Minutes per session: 0 min  . Stress: To some extent  Relationships  . Social Herbalist on phone: Three times a week    Gets together: Once a week    Attends religious service: 1 to 4 times per year    Active member of club or organization: Yes    Attends meetings of clubs or  organizations: 1 to 4 times per year    Relationship status: Divorced  . Intimate partner violence    Fear of current or ex partner: Not on file    Emotionally abused: Not on file    Physically abused: Not on file    Forced sexual activity: Not on file  Other Topics Concern  . Not on file  Social History Narrative   Lives alone near [place]    Physical Exam      Future Appointments  Date Time Provider [place]  11/23/2018  1:30 PM PCE-COVERING PROVIDER PCE-PCE None    BP (!) 152/82   Pulse 96   Temp 97.7 F (36.5 C)   Resp 18   SpO2 96%   Pt reports breathing is about the same. He has been cleaning the house doing strenuous things like pressure washing it with clorox, digging trenches for the water leaks.  I advised him not to be doing those things to cause his breathing to worsen. There is a fan in the other side of the house that is not in tact--no flooring and goes straight to ground and standing water.  Still no heater-but he does state he breaths better when its colder but he still needs something to knock the chill off.  He is out of prednisone again.  Has not received the letter from [employer] for him to use to get his SSN.  B/p elevated and he has slight wheeze to the rt side.  Relayed b/p and out of prednisone to jane.   [name], [facility] Paramedic  11/08/18

## 2018-11-09 ENCOUNTER — Ambulatory Visit
Admission: EM | Admit: 2018-11-09 | Discharge: 2018-11-09 | Disposition: A | Discharge status: Home / Self Care | Payer: Self-pay | Attending: Physician Assistant | Admitting: Physician Assistant

## 2018-11-09 ENCOUNTER — Ambulatory Visit (INDEPENDENT_AMBULATORY_CARE_PROVIDER_SITE_OTHER): Payer: Self-pay

## 2018-11-09 ENCOUNTER — Ambulatory Visit: Payer: Self-pay

## 2018-11-09 DIAGNOSIS — J441 Chronic obstructive pulmonary disease with (acute) exacerbation: Secondary | ICD-10-CM

## 2018-11-09 DIAGNOSIS — Z20828 Contact with and (suspected) exposure to other viral communicable diseases: Secondary | ICD-10-CM

## 2018-11-09 MED ORDER — DEXAMETHASONE SODIUM PHOSPHATE 10 MG/ML IJ SOLN
10.0000 mg | Freq: Once | INTRAMUSCULAR | Status: AC
  Administered 2018-11-09: 11:00:00 10 mg via INTRAMUSCULAR

## 2018-11-09 MED ORDER — DOXYCYCLINE HYCLATE 100 MG PO CAPS: 100.0000 mg | ORAL_CAPSULE | Freq: Two times a day (BID) | ORAL | 0 refills | Status: DC

## 2018-11-09 MED ORDER — PREDNISONE 50 MG PO TABS: 50.0000 mg | ORAL_TABLET | Freq: Every day | ORAL | 0 refills | Status: DC

## 2018-11-09 MED FILL — ?DOXYCYCLINE HYCLATE 100MG: 100 | 10 days supply | Qty: 20 | Fill #0

## 2018-11-09 MED FILL — predniSONE 10 MG TABS: 10 | 5 days supply | Qty: 25 | Fill #0

## 2018-11-09 NOTE — Telephone Encounter (Signed)
Call placed to [place] , Letta Pate # [phone]  to inquire if patient has pending medicaid application. Message left with call back requested to this CM   Message also left for Kathy/Financial healthcare resources # [phone], to inquire about possible pending medicaid application.  Call back requested to this CM

## 2018-11-09 NOTE — Telephone Encounter (Signed)
Thank you Opal Sidles,  noted.    [name]

## 2018-11-09 NOTE — ED Triage Notes (Signed)
Pt states out of his prednisone x2days. States having SOB after bleaching his house. States PCP on vacation.

## 2018-11-09 NOTE — Discharge Instructions (Signed)
Xray negative for new changes, does show changes from smoking. As discussed, cannot rule out COVID, please stay in quarantine until testing results return. Start prednisone and doxycycline as directed. Continue to use your inhaler, you can increase your albuterol to every 4-6 hours as needed. Follow up with PCP for recheck in 1 week. If worsening shortness of breath, go to the emergency department for further evaluation needed.

## 2018-11-09 NOTE — ED Provider Notes (Signed)
[facility]    CSN: [id number] Arrival date & time: 11/09/18  0836      History   Chief Complaint Chief Complaint  Patient presents with  . Medication Refill    HPI James [name] is a 58 y.o. male.   58 year old male with history of COPD, hypertension comes in for 1 month history of worsening productive cough, with significant worsening after reaching his house 2 days ago.  Patient was admitted for recurrent pneumonia with sepsis 09/11/2018.  Rechecked with PCP 10/05/2018, and was put on Levaquin.  He states symptoms from then completely resolved prior to current symptom onset.  He denies rhinorrhea, nasal congestion.  Denies fever, chills, body aches.  He feels short of breath mostly with exertion.  Denies chest pain, leg swelling, orthopnea.  Patient states he is on chronic daily prednisone, but has been out for the past 2 days.  He is on albuterol, Dulera, Incruse.  States has been using albuterol twice a day.  Former smoker.     Past Medical History:  Diagnosis Date  . Acute on chronic respiratory failure with hypoxia ([place]) 06/08/2013  . CAP (community acquired pneumonia) 05/17/2018   See admit 05/17/18 ? rml  with   covid pcr neg - rx augmentin > f/u cxr in 4-6 weeks is fine unless condition declines   . COPD (chronic obstructive pulmonary disease) ([place])    not on home  . Dyspnea   . Hypertension   . Pneumonia 04/06/2016  . Pneumothorax    2016, fell from ladder  . Tick bite 06/03/2018    Patient Active Problem List   Diagnosis Date Noted  . 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 Marijuana abuse 08/09/2018  . Hypertension 05/17/2016  . Low back pain radiating to both legs 04/18/2016  . History of tobacco abuse 04/06/2016  . Alcohol abuse 04/06/2016  . COPD mixed type ([place]) 03/26/2016    Past Surgical History:  Procedure Laterality Date  . VIDEO ASSISTED THORACOSCOPY (VATS)/THOROCOTOMY Left 06/11/2013   Procedure: VIDEO ASSISTED THORACOSCOPY  (VATS)/THOROCOTOMY;  Surgeon: Ivin Poot, MD;  Location: [place];  Service: Thoracic;  Laterality: Left;  [name] VIDEO BRONCHOSCOPY N/A 06/11/2013   Procedure: VIDEO BRONCHOSCOPY;  Surgeon: Ivin Poot, MD;  Location: [place];  Service: Thoracic;  Laterality: N/A;  . VIDEO BRONCHOSCOPY N/A 08/10/2018   Procedure: VIDEO BRONCHOSCOPY WITH FLUORO;  Surgeon: Candee Furbish, MD;  Location: [facility]COPY;  Service: Endoscopy;  Laterality: N/A;       Home Medications    Prior to Admission medications   Medication Sig Start Date End Date Taking? Authorizing Provider  albuterol (PROVENTIL HFA;VENTOLIN HFA) 108 (90 Base) MCG/ACT inhaler Inhale 1-2 puffs into the lungs every 6 (six) hours as needed for wheezing or shortness of breath. 03/29/18   Scot Jun, FNP  albuterol (PROVENTIL) (2.5 MG/3ML) 0.083% nebulizer solution Take 3 mLs (2.5 mg total) by nebulization every 6 (six) hours as needed for wheezing or shortness of breath. 06/20/18   Scot Jun, FNP  amitriptyline (ELAVIL) 25 MG tablet Take 1 tablet (25 mg total) by mouth at bedtime. Patient not taking: Reported on 10/23/2018 10/05/18   [name], MD  calamine lotion Apply 1 application topically as needed for itching. 10/05/18   [name], MD  doxycycline (VIBRAMYCIN) 100 MG capsule Take 1 capsule (100 mg total) by mouth 2 (two) times daily. 11/09/18   Tasia Catchings, Amy V, PA-C  folic acid (FOLVITE) 1 MG tablet  Take 1 tablet (1 mg total) by mouth daily. 09/28/18   [name], MD  losartan (COZAAR) 100 MG tablet TAKE 1 TABLET (100 MG TOTAL) BY MOUTH DAILY. 09/28/18   [name], MD  mometasone-formoterol (DULERA) 200-5 MCG/ACT AERO Inhale 2 puffs into the lungs 2 (two) times daily. 07/18/18   Scot Jun, FNP  ondansetron (ZOFRAN) 4 MG tablet Take 1 tablet (4 mg total) by mouth every 8 (eight) hours as needed for nausea or vomiting. Patient not taking: Reported on 10/31/2018 10/05/18   [name], MD   predniSONE (DELTASONE) 50 MG tablet Take 1 tablet (50 mg total) by mouth daily with breakfast. 11/09/18   Ok Edwards, PA-C  Respiratory Therapy Supplies (FLUTTER) DEVI Use 4 times daily 04/27/18   [name], MD  Spacer/Aero-Holding Chambers (AEROCHAMBER MV) inhaler Use as instructed 08/03/18   [name], MD  thiamine (VITAMIN B-1) 100 MG tablet Take 1 tablet (100 mg total) by mouth daily. 08/03/18   [name], MD  umeclidinium bromide (INCRUSE ELLIPTA) 62.5 MCG/INH AEPB Inhale 1 puff into the lungs daily. 07/25/18   [name], MD    Family History Family History  Problem Relation Age of Onset  . Heart disease Father   . COPD Sister     Social History Social History   Tobacco Use  . Smoking status: Former Smoker    Packs/day: 1.50    Years: 40.00    Pack years: 60.00    Types: Cigarettes    Quit date: 2014    Years since quitting: 6.8  . Smokeless tobacco: Current User    Types: Chew  Substance Use Topics  . Alcohol use: Yes    Alcohol/week: 28.0 standard drinks    Types: 28 Cans of beer per week    Comment: 4 beers a night; + jitters with not drinking, denies DTs or seizures  . Drug use: Yes    Types: Marijuana    Comment: daily; quit using crack and methamphetamine about 5 months ago      Allergies   Gabapentin   Review of Systems Review of Systems  Reason unable to perform ROS: See HPI as above.     Physical Exam Triage Vital Signs ED Triage Vitals  Enc Vitals Group     BP 11/09/18 0914 136/79     Pulse Rate 11/09/18 0914 94     Resp 11/09/18 0914 18     Temp 11/09/18 0914 97.8 F (36.6 C)     Temp Source 11/09/18 0914 Oral     SpO2 11/09/18 0914 96 %     Weight --      Height --      Head Circumference --      Peak Flow --      Pain Score 11/09/18 0915 0     Pain Loc --      Pain Edu? --      Excl. in [place]? --    No data found.  Updated Vital Signs BP 136/79 (BP Location: Left Arm)   Pulse 94   Temp 97.8 F (36.6 C)  (Oral)   Resp 18   SpO2 96%   Physical Exam Constitutional:      General: He is not in acute distress.    Appearance: Normal appearance. He is not ill-appearing, toxic-appearing or diaphoretic.  HENT:     Head: Normocephalic and atraumatic.     Mouth/Throat:  Mouth: Mucous membranes are moist.     Pharynx: Oropharynx is clear. Uvula midline.  Neck:     Musculoskeletal: Normal range of motion and neck supple.  Cardiovascular:     Rate and Rhythm: Normal rate and regular rhythm.     Heart sounds: Normal heart sounds. No murmur. No friction rub. No gallop.   Pulmonary:     Effort: Pulmonary effort is normal. No accessory muscle usage, prolonged expiration, respiratory distress or retractions.     Comments: Patient speaking in full sentences without difficulty.  Has diffuse inspiratory and expiratory wheezing. Although with good air movement. Skin:    General: Skin is warm and dry.  Neurological:     General: No focal deficit present.     Mental Status: He is alert and oriented to person, place, and time.      UC Treatments / Results  Labs (all labs ordered are listed, but only abnormal results are displayed) Labs Reviewed  NOVEL CORONAVIRUS, NAA    EKG   Radiology Dg Chest 2 View  Result Date: 11/09/2018 CLINICAL DATA:  58 year old male with cough and shortness of breath. EXAM: CHEST - 2 VIEW COMPARISON:  Chest radiograph dated 10/05/2018 FINDINGS: There is diffuse chronic interstitial and peribronchial coarsening. Left lung base subsegmental atelectasis/scarring. No focal consolidation, pleural effusion, or pneumothorax. The cardiac silhouette is within normal limits. Atherosclerotic calcification of the aorta. There is degenerative changes of the spine. Multiple old healed bilateral rib fractures. No acute osseous pathology. IMPRESSION: 1. No acute cardiopulmonary process. 2. Diffuse chronic interstitial and peribronchial coarsening. Electronically Signed   By: Anner Crete M.D.   On: 11/09/2018 10:02    Procedures Procedures (including critical care time)  Medications Ordered in UC Medications  dexamethasone (DECADRON) injection 10 mg (10 mg Intramuscular Given 11/09/18 1109)    Initial Impression / Assessment and Plan / UC Course  I have reviewed the triage vital signs and the nursing notes.  Pertinent labs & imaging results that were available during my care of the patient were reviewed by me and considered in my medical decision making (see chart for details).    Upon chart review, patient did not follow-up for recheck after treatment with Levaquin.  PCP office declined refill of prednisone, and would like him to be evaluated in person.  Patient with diffuse inspiratory and expiratory wheezing on lung exam, with good air movement.  He has albuterol inhaler, though not with him.  Given current Covid pandemic, unable to provide nebulizer treatment in office.  Patient stable with O2 sat 96%, able to speak in full sentences.  Will obtain chest x-ray to ensure resolution of pneumonia from 1 month ago, and to assess for current symptoms.  Discussed chest x-ray results with patient.  Will send for Covid for further evaluation.  We will treat for COPD exacerbation with prednisone and doxycycline.  Given diffuse wheezing on exam, will provide Decadron injection in office today.  Continue albuterol inhaler as needed.  Patient to follow-up with PCP in 1 week for reevaluation of Covid is negative.  Strict return precautions given.  Final Clinical Impressions(s) / UC Diagnoses   Final diagnoses:  COPD exacerbation [facility])   ED Prescriptions    Medication Sig Dispense Auth. Provider   predniSONE (DELTASONE) 50 MG tablet Take 1 tablet (50 mg total) by mouth daily with breakfast. 5 tablet Yu, Amy V, PA-C   doxycycline (VIBRAMYCIN) 100 MG capsule Take 1 capsule (100 mg total) by mouth 2 (two)  times daily. 20 capsule Ok Edwards, PA-C     PDMP not reviewed this  encounter.   Ok Edwards, PA-C 11/09/18 1200

## 2018-11-10 LAB — NOVEL CORONAVIRUS, NAA: SARS-CoV-2, NAA: NOT DETECTED

## 2018-11-15 ENCOUNTER — Other Ambulatory Visit (HOSPITAL_COMMUNITY): Payer: Self-pay

## 2018-11-15 ENCOUNTER — Telehealth: Payer: Self-pay

## 2018-11-15 MED ORDER — PREDNISONE 10 MG PO TABS: ORAL_TABLET | ORAL | 1 refills | Status: DC

## 2018-11-15 MED FILL — predniSONE 10 MG TABS: 10 | 25 days supply | Qty: 60 | Fill #0

## 2018-11-15 NOTE — Telephone Encounter (Signed)
Call received from [name], EMT noting that the patient is almost out of prednisone and she is asking if PCP wants to refill.  His next appointment is 11/23/2018 with [name].    Joellen Jersey also said his sister has not returned the call to Legal Aid because she was scared. Katie reassured her that it was important for her to return the call as they are trying to provide assistance and it is the only want we can obtain some resources for providing help.  She is supposed to call Katie back and let her know how the call goes.

## 2018-11-15 NOTE — Progress Notes (Signed)
Paramedicine Encounter    Patient ID: James [name], male    DOB: 1960-12-31, 58 y.o.   MRN: [id number]   Patient Care Team: [name], MD as PCP - General (Pulmonary Disease) O'Neal, Cassie Freer, MD as PCP - Cardiology (Cardiology)  Patient Active Problem List   Diagnosis Date Noted  . 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 Marijuana abuse 08/09/2018  . Hypertension 05/17/2016  . Low back pain radiating to both legs 04/18/2016  . History of tobacco abuse 04/06/2016  . Alcohol abuse 04/06/2016  . COPD mixed type ([place]) 03/26/2016    Current Outpatient Medications:  .  albuterol (PROVENTIL HFA;VENTOLIN HFA) 108 (90 Base) MCG/ACT inhaler, Inhale 1-2 puffs into the lungs every 6 (six) hours as needed for wheezing or shortness of breath., Disp: 1 Inhaler, Rfl: 5 .  albuterol (PROVENTIL) (2.5 MG/3ML) 0.083% nebulizer solution, Take 3 mLs (2.5 mg total) by nebulization every 6 (six) hours as needed for wheezing or shortness of breath., Disp: 150 mL, Rfl: 1 .  amitriptyline (ELAVIL) 25 MG tablet, Take 1 tablet (25 mg total) by mouth at bedtime. (Patient not taking: Reported on 10/23/2018), Disp: 30 tablet, Rfl: 1 .  calamine lotion, Apply 1 application topically as needed for itching., Disp: 120 mL, Rfl: 0 .  doxycycline (VIBRAMYCIN) 100 MG capsule, Take 1 capsule (100 mg total) by mouth 2 (two) times daily., Disp: 20 capsule, Rfl: 0 .  folic acid (FOLVITE) 1 MG tablet, Take 1 tablet (1 mg total) by mouth daily., Disp: 90 tablet, Rfl: 1 .  losartan (COZAAR) 100 MG tablet, TAKE 1 TABLET (100 MG TOTAL) BY MOUTH DAILY., Disp: 30 tablet, Rfl: 2 .  mometasone-formoterol (DULERA) 200-5 MCG/ACT AERO, Inhale 2 puffs into the lungs 2 (two) times daily., Disp: 13 g, Rfl: 2 .  ondansetron (ZOFRAN) 4 MG tablet, Take 1 tablet (4 mg total) by mouth every 8 (eight) hours as needed for nausea or vomiting. (Patient not taking: Reported on 10/31/2018), Disp: 20 tablet, Rfl: 0 .  predniSONE  (DELTASONE) 50 MG tablet, Take 1 tablet (50 mg total) by mouth daily with breakfast., Disp: 5 tablet, Rfl: 0 .  Respiratory Therapy Supplies (FLUTTER) DEVI, Use 4 times daily, Disp: 1 each, Rfl: 0 .  Spacer/Aero-Holding Chambers (AEROCHAMBER MV) inhaler, Use as instructed, Disp: 1 each, Rfl: 0 .  thiamine (VITAMIN B-1) 100 MG tablet, Take 1 tablet (100 mg total) by mouth daily., Disp: 30 tablet, Rfl: 3 .  umeclidinium bromide (INCRUSE ELLIPTA) 62.5 MCG/INH AEPB, Inhale 1 puff into the lungs daily., Disp: 30 each, Rfl: 5 Allergies  Allergen Reactions  . Gabapentin Other (See Comments)    hallucinations      Social History   Socioeconomic History  . Marital status: Divorced    Spouse name: Not on file  . Number of children: Not on file  . Years of education: Not on file  . Highest education level: Not on file  Occupational History  . Occupation: Dealer  . Occupation: [employer]  . Financial resource strain: Hard  . Food insecurity    Worry: Not on file    Inability: Never true  . Transportation needs    Medical: No    Non-medical: No  Tobacco Use  . Smoking status: Former Smoker    Packs/day: 1.50    Years: 40.00    Pack years: 60.00    Types: Cigarettes    Quit date: 2014    Years since quitting: 6.8  .  Smokeless tobacco: Current User    Types: Chew  Substance and Sexual Activity  . Alcohol use: Yes    Alcohol/week: 28.0 standard drinks    Types: 28 Cans of beer per week    Comment: 4 beers a night; + jitters with not drinking, denies DTs or seizures  . Drug use: Yes    Types: Marijuana    Comment: daily; quit using crack and methamphetamine about 5 months ago   . Sexual activity: Not Currently    Partners: Female  Lifestyle  . Physical activity    Days per week: 0 days    Minutes per session: 0 min  . Stress: To some extent  Relationships  . Social Herbalist on phone: Three times a week    Gets together: Once a week    Attends  religious service: 1 to 4 times per year    Active member of club or organization: Yes    Attends meetings of clubs or organizations: 1 to 4 times per year    Relationship status: Divorced  . Intimate partner violence    Fear of current or ex partner: Not on file    Emotionally abused: Not on file    Physically abused: Not on file    Forced sexual activity: Not on file  Other Topics Concern  . Not on file  Social History Narrative   Lives alone near [place]    Physical Exam      Future Appointments  Date Time Provider [place]  11/23/2018  1:30 PM PCE-COVERING PROVIDER PCE-PCE None    BP (!) 142/98   Pulse (!) 106   Temp 98.4 F (36.9 C)   Resp 20   SpO2 96%   Pt reports he is doing so-so.  He still continues to work for [employer] doing strenuous type work. He is about to run out of his prednisone. Sent message to Opal Sidles to see if [name] wants to continue this medication.  Nothing else has been done to the house except a fan placed in the back of the house to help "dry it out" however that is not going to work.  There is standing water all around the house and foundation today from the recent rain.  He denies c/p, no dizziness. He is wheezing all lung lobes. He is using his inhaler and neb tx.  He did not have his meds to show for today but reports he has them all.  His sister did not speak to legal aid yet. She was scared it was actual for legal proceedings. I explained to her, it was not and it was for resources. And nobody wants to pursue things legally with [name].  Pt is going back to work after our visit, I asked him to please use his neb tx before he goes back out to avoid a COPD exacerbation if possible.  Not sure what kind of work he is going to be doing today.   [name], [facility] Paramedic  11/15/18

## 2018-11-15 NOTE — Telephone Encounter (Signed)
Can you let sister know I sent Rx refill on prednisone to our pharmacy

## 2018-11-22 ENCOUNTER — Telehealth: Payer: Self-pay

## 2018-11-22 ENCOUNTER — Other Ambulatory Visit (HOSPITAL_COMMUNITY): Payer: Self-pay

## 2018-11-22 NOTE — Progress Notes (Signed)
Paramedicine Encounter    Patient ID: NIKALAS BRAMEL, male    DOB: 11/17/1960, 58 y.o.   MRN: [id number]   Patient Care Team: [name], MD as PCP - General (Pulmonary Disease) O'Neal, Cassie Freer, MD as PCP - Cardiology (Cardiology)  Patient Active Problem List   Diagnosis Date Noted  . 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 Marijuana abuse 08/09/2018  . Hypertension 05/17/2016  . Low back pain radiating to both legs 04/18/2016  . History of tobacco abuse 04/06/2016  . Alcohol abuse 04/06/2016  . COPD mixed type ([place]) 03/26/2016    Current Outpatient Medications:  .  albuterol (PROVENTIL HFA;VENTOLIN HFA) 108 (90 Base) MCG/ACT inhaler, Inhale 1-2 puffs into the lungs every 6 (six) hours as needed for wheezing or shortness of breath., Disp: 1 Inhaler, Rfl: 5 .  albuterol (PROVENTIL) (2.5 MG/3ML) 0.083% nebulizer solution, Take 3 mLs (2.5 mg total) by nebulization every 6 (six) hours as needed for wheezing or shortness of breath., Disp: 150 mL, Rfl: 1 .  amitriptyline (ELAVIL) 25 MG tablet, Take 1 tablet (25 mg total) by mouth at bedtime. (Patient not taking: Reported on 10/23/2018), Disp: 30 tablet, Rfl: 1 .  calamine lotion, Apply 1 application topically as needed for itching., Disp: 120 mL, Rfl: 0 .  doxycycline (VIBRAMYCIN) 100 MG capsule, Take 1 capsule (100 mg total) by mouth 2 (two) times daily., Disp: 20 capsule, Rfl: 0 .  folic acid (FOLVITE) 1 MG tablet, Take 1 tablet (1 mg total) by mouth daily., Disp: 90 tablet, Rfl: 1 .  losartan (COZAAR) 100 MG tablet, TAKE 1 TABLET (100 MG TOTAL) BY MOUTH DAILY., Disp: 30 tablet, Rfl: 2 .  mometasone-formoterol (DULERA) 200-5 MCG/ACT AERO, Inhale 2 puffs into the lungs 2 (two) times daily., Disp: 13 g, Rfl: 2 .  ondansetron (ZOFRAN) 4 MG tablet, Take 1 tablet (4 mg total) by mouth every 8 (eight) hours as needed for nausea or vomiting. (Patient not taking: Reported on 10/31/2018), Disp: 20 tablet, Rfl: 0 .  predniSONE  (DELTASONE) 10 MG tablet, Take 4 tablets daily for 5 days then two tablets daily and stay at that dose, Disp: 60 tablet, Rfl: 1 .  Respiratory Therapy Supplies (FLUTTER) DEVI, Use 4 times daily, Disp: 1 each, Rfl: 0 .  Spacer/Aero-Holding Chambers (AEROCHAMBER MV) inhaler, Use as instructed, Disp: 1 each, Rfl: 0 .  thiamine (VITAMIN B-1) 100 MG tablet, Take 1 tablet (100 mg total) by mouth daily., Disp: 30 tablet, Rfl: 3 .  umeclidinium bromide (INCRUSE ELLIPTA) 62.5 MCG/INH AEPB, Inhale 1 puff into the lungs daily., Disp: 30 each, Rfl: 5 Allergies  Allergen Reactions  . Gabapentin Other (See Comments)    hallucinations      Social History   Socioeconomic History  . Marital status: Divorced    Spouse name: Not on file  . Number of children: Not on file  . Years of education: Not on file  . Highest education level: Not on file  Occupational History  . Occupation: Dealer  . Occupation: [employer]  . Financial resource strain: Hard  . Food insecurity    Worry: Not on file    Inability: Never true  . Transportation needs    Medical: No    Non-medical: No  Tobacco Use  . Smoking status: Former Smoker    Packs/day: 1.50    Years: 40.00    Pack years: 60.00    Types: Cigarettes    Quit date: 2014    Years  since quitting: 6.8  . Smokeless tobacco: Current User    Types: Chew  Substance and Sexual Activity  . Alcohol use: Yes    Alcohol/week: 28.0 standard drinks    Types: 28 Cans of beer per week    Comment: 4 beers a night; + jitters with not drinking, denies DTs or seizures  . Drug use: Yes    Types: Marijuana    Comment: daily; quit using crack and methamphetamine about 5 months ago   . Sexual activity: Not Currently    Partners: Female  Lifestyle  . Physical activity    Days per week: 0 days    Minutes per session: 0 min  . Stress: To some extent  Relationships  . Social Herbalist on phone: Three times a week    Gets together: Once a  week    Attends religious service: 1 to 4 times per year    Active member of club or organization: Yes    Attends meetings of clubs or organizations: 1 to 4 times per year    Relationship status: Divorced  . Intimate partner violence    Fear of current or ex partner: Not on file    Emotionally abused: Not on file    Physically abused: Not on file    Forced sexual activity: Not on file  Other Topics Concern  . Not on file  Social History Narrative   Lives alone near [place]    Physical Exam      Future Appointments  Date Time Provider [place]  11/23/2018  1:30 PM PCE-COVERING PROVIDER [facility] None    BP (!) 142/78   Pulse (!) 110   Temp (!) 97.1 F (36.2 C)   Resp 18   SpO2 96%    Pt reports he is doing ok. He is not using any heat right now--he states he could start a fire in the wood stove if he wants to but at this time he is able to breathe much easier in the cooler temps. His lungs were actually clear today.  We sorted through his mail today-there was a SS letter-[facility] has helped him complete an internet application for his disability benefits and there were 2 waivers of release of information in there he had to sign so he signed it and I will mail for him. I contacted Cecille Rubin at the servant center [phone]) and left a message for her to return my call.  He has not received anything from [facility] (other than bills) of medical records to prove ID.   [name], [facility] Paramedic  11/22/18

## 2018-11-22 NOTE — Telephone Encounter (Signed)
As per [facility] [place], they have not been able to speak to the patient's sister yet, but will keep trying.  Informed her that the community paramedic has encouraged the patient's sister to return the calls to Legal Aid when she receives them

## 2018-11-22 NOTE — Telephone Encounter (Signed)
Called patient to do their pre-visit COVID screening.  Call went to voicemail. Unable to do prescreening.  

## 2018-11-23 ENCOUNTER — Other Ambulatory Visit: Payer: Self-pay

## 2018-11-23 ENCOUNTER — Telehealth: Payer: Self-pay

## 2018-11-23 ENCOUNTER — Ambulatory Visit (INDEPENDENT_AMBULATORY_CARE_PROVIDER_SITE_OTHER): Payer: Self-pay | Admitting: Critical Care Medicine

## 2018-11-23 VITALS — BP 136/75 | HR 107 | Temp 97.3°F | Resp 17 | Wt 180.8 lb

## 2018-11-23 DIAGNOSIS — J449 Chronic obstructive pulmonary disease, unspecified: Secondary | ICD-10-CM

## 2018-11-23 DIAGNOSIS — F1090 Alcohol use, unspecified, uncomplicated: Secondary | ICD-10-CM

## 2018-11-23 DIAGNOSIS — R0602 Shortness of breath: Secondary | ICD-10-CM

## 2018-11-23 DIAGNOSIS — Z87898 Personal history of other specified conditions: Secondary | ICD-10-CM | POA: Insufficient documentation

## 2018-11-23 DIAGNOSIS — Z77120 Contact with and (suspected) exposure to mold (toxic): Secondary | ICD-10-CM

## 2018-11-23 DIAGNOSIS — B0223 Postherpetic polyneuropathy: Secondary | ICD-10-CM

## 2018-11-23 DIAGNOSIS — Z91048 Other nonmedicinal substance allergy status: Secondary | ICD-10-CM

## 2018-11-23 HISTORY — DX: Alcohol use, unspecified, uncomplicated: F10.90

## 2018-11-23 NOTE — Telephone Encounter (Signed)
Call placed to DSS to check on status of medicaid application.  Spoke to April who stated that they sent a letter to [place] [employer] 11/16/2018 requesting additional information

## 2018-11-23 NOTE — Telephone Encounter (Signed)
I saw pt today  I spoke to sister who will call Legal Aid

## 2018-11-23 NOTE — Assessment & Plan Note (Signed)
Postherpetic neuropathy has resolved

## 2018-11-23 NOTE — Assessment & Plan Note (Signed)
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

## 2018-11-23 NOTE — Patient Instructions (Signed)
Take prednisone as prescribed  No change in inhalers and other medications  Your sister will be calling legal aid and connect with them to see if we can provide you support to get you safer housing either in your current location or in a different location  An allergy blood test is obtained today  Return follow-up 1 month

## 2018-11-23 NOTE — Telephone Encounter (Signed)
Call received from [name], EMT.  She completed a home visit and reports that he is doing really well, in a good mood, breathing is better, no wheezing. She encouraged him to keep his appt today with [name].   She reviewed his mail with him and noted that he received a letter from [facility] requesting his signature which he completed.  She noted that the [facility] has been assisting him with the disability application.  She reminded him to save his mail for her to review with him next week.   There is no update regarding medicaid application that was initiated when he was in the hospital.  His sister is still trying to contact Legal Aid.  This CM noted that Legal Aid has been trying to reach her and they will continue to try.  Katie noted that nothing has been done with the house  They are trying to dry out the back portion of the home that was open.  He has no heat; but has as wood stove that he can use when needed. Regarding the lack of heat, he told Joellen Jersey that he " likes it that way."

## 2018-11-23 NOTE — Assessment & Plan Note (Signed)
Significant allergies likely to mold in the current housing likely driving the patient's respiratory illness  Plan will be for the patient to continue chronic prednisone therapy and continue inhalers as prescribed  We will try to get the patient into safer housing in the meantime we will check an allergy mold assay and IgE level

## 2018-11-23 NOTE — Progress Notes (Signed)
Subjective:    Patient ID: James [name], male    DOB: 1960/03/20, 58 y.o.   MRN: [id number]  58 y.o.M with advanced Copd   I saw this patient a week ago and prescribed antibiotics and prednisone for what I perceived to be persisting pneumonia.  The patient had a slight improvement but then worsened and came to the emergency room the end of the week and was admitted for 3 days for COPD exacerbation and lower lobe pneumonia.  Chest x-ray did show persistent infiltrates in the lower lobes.  The patient is now discharged and returns to the office for post hospital follow-up.  Below is the discharge summary Dc summary : Admit date: 07/31/2018 Discharge date: 08/01/2018  Time spent: 45 minutes  Recommendations for Outpatient Follow-up:  Patient will be discharged to home.  Patient will need to follow up with primary care provider within one week of discharge.  Patient should continue medications as prescribed.  Patient should follow a heart healthy diet.   Discharge Diagnoses:  Acute on chronic respiratory failure with hypoxia secondary to COPD Exacerbation and pneumonia Essential hypertension  Discharge Condition: Stable  Diet recommendation: heart healthy  Filed Weights  07/31/18 0231 07/31/18 0602 Weight: 85.3 kg 83.4 kg   History of present illness:  on 07/31/2018 by James [name] a 58 y.o.malewith medical history significant ofCOPD. Patient has had 3 prior admits for COPD / PNA thus far this year, most recently in May.  Patient has been being treated for COPD exacerbation and BLL CAP as outpatient by Dr. Joya [name] with levaquin and prednisone. Just finished levaquin yesterday. Prednisone remained at 40mg  daily with plans to initiate a slow taper.  Despite this, patient developed severe SOB and respiratory distress this evening. EMS called and patient noted to be satting 80% on RA. He is not on chronic O2 for his COPD.  Hospital Course:    Acute on chronic respiratory failure with hypoxia secondary to COPD Exacerbation and pneumonia -Patient noted to have oxygen saturations in the 80s on room air -Chest x-ray noted for pneumonia -Was recently placed on Levaquin and steroids by PCP approximately 1 week ago -On admission, patient continued to have wheezing well into his hospital stay day 1.  Patient currently still has some wheezing however has improved. -Was placed on Solu-Medrol, along with nebulizer treatments, azithromycin and ceftriaxone -Patient did require oxygen upon admission however has been able to ambulate and maintain oxygen saturations in the high 90s on room air. -Will discharge patient with prednisone taper, azithromycin and Ceftin. -Given that patient had oxygen saturations in the 80s on admission, it was thought that he would require several days in the hospital. Patient actually improved quicker than expected.  Essential hypertension -Continue losartan  Since discharge the patient is tried to go back to work but has extreme difficulty in the heat with this.  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  10/05/2018 This patient's not been seen since the end of July he had failed several follow-up visits.  He did see Dr. Shyrl [name] end of August who did not make major changes in his  medication program.  He is to remain on [name] and Incruse.  The patient after being admitted in July got readmitted early August and then again in early September.  Discharge summaries are as noted below.  During the August admission the patient underwent bronchoscopy and had mucous plugging removed the culture showed normal  flora Note this patient has not been actively smoking recently Admit date: 08/09/2018 Discharge date: 08/11/2018  Discharge Diagnoses:  Acute on chronic hypoxic respiratory failure/sepsis due to multifocal pneumonia COPD Alcohol/marijuana use Essential hypertension  History of present illness:  on 08/09/2018 by James [name] a 58 y.o.malewith medical history significant ofCOPD and ETOH dependence presenting with respiratory distress.He woke up about 530 this AM and couldn't breathe. This just keeps happening. He felt similarly when hospitalized last week. He has been doing fine until today. No h/o OSA, not on CPAP. Not on home O2. +cough, productive of yellowish sputum. It is sometimes hard to get the sputum up. No fever. +wheezing since his last hospitalization. Quit smoking in 2014. He lives in a non-air-conditioned log cabin.  He was hospitalized from 7/27-28 for acute on chronic respiratory failure associated with COPD exacerbation and PNA. This was his 4th admission this year. He was seen by Dr. Joya [name] on 7/30 with plan for completion of course of Keflex and prednisone, as well as prednisone taper.  Hospital Course:  Acute on chronic hypoxic respiratory failure/sepsis due to multifocal pneumonia -Was recently hospitalized in late July for pneumonia and COPD exacerbation. Was discharged with antibiotics as well as prednisone taper. Patient followed up with Dr. Joya [name] on 08/03/2018 with completion of Keflex and prednisone as well. -This is his fourth admission this year for similar presentation -Patient presented with cough and shortness of breath -Chest x-rayon admissionfindings consistent with right-sided multifocal pneumonia -CTA chest showed multifocal pneumonia on the right. COPD including emphysema. Negative for PE. -COVID negative -Upon admission, patient had leukocytosis, fever, tachycardia, tachypnea- all resolving  -Noted to be  hypoxic 88% on room air in the ED -Urine strep pneumoniaand legionellaantigensnegative -Sputum culture: Few GPC, GVR, rare yeast -Blood cultures show no growth -was placed on vancomycin, Zosyn, Solu-Medrol, supplemental oxygen -S/p bronchoscopy 8/6, pending culture results -CXR obtained after bronch on 8/6: almost complete clearing of the hazy infiltrates in the right lung -Discussed with Dr. Tamala Julian (pulm), recommended prednisone taper starting with prednisone 40mg  daily x 1 week, taper as an outpatient, along with Augmentin. He will follow up with the patient in one week. Given that infection cleared on xray so quickly, question if this is inflammatory vs infection.  -patient able to ambulate today without oxygen and maintained oxygen saturations in the mid 90s- therefore patient does not qualify for home oxygen -patient also had a swallow eval- mild aspiration risk  COPD -Continue nebulizer treatments, steroids, inhalers -Incentive spirometry, flutter valve  Alcohol/marijuana use -Discussed cessation -was placed on continue CIWA protocol  Essential hypertension -Continue Cozaar  Consultants PCCM- pulmonology   Procedures Bronchoscopy   Recommendations for Outpatient Follow-up:  1. Follow up with PCP in 1-2 weeks 2. Please obtain BMP/CBC in one week your next doctors visit.  3. Augmentin and azithromycin for 5 days 4. Advised to continue using bronchodilators every 4-6 hours for the next 4-5 days and then transition to as needed. 5. Advised to take his home prednisone, for next 3 days advised to increase it to 40 mg twice daily then resume back is 20 mg daily  Discharge Condition: Stable CODE  STATUS: Full code Diet recommendation: 2 g salt   DC summary from 09/11/18  Adm 9/7  D/C 09/13/18 Brief/Interim Summary: James [name] a 58 y.o.malewith history ofCOPD on prednisone, HTN, alcohol use disorder, marijuana use,tobacco user. Patient presented secondary to  dyspnea and found to have evidence of pneumonia and a COPD exacerbation in addition to sepsis physiology on admission.  There is concerns of possible right lower lobe pneumonia therefore started on Unasyn and azithromycin due to concerns of aspiration.  This was transitioned to Augmentin and azithromycin for total of 5 days at time of discharge.  Secondary to infection, he was having COPD exacerbation therefore on bronchodilators.  He started improving and on the day of discharge he was adamant about going home as he was feeling much better.  He was still having abnormal breath sounds and cough therefore I had requested him to stay another day in the hospital but still wanted to go home.  Instructions were given to him take 40 mg of prednisone for next 3 days and then he can resume back his 20 mg daily.  Advised... home bronchodilators periodically every 4-6 hours for next 4-5 days and then transition to as needed. Advised to follow-up with his PCP in about 1 week.   Discharge Diagnoses:  Active Problems:   Hypertension   COPD exacerbation ([place])   Marijuana abuse   Pneumonia   Sepsis ([place])  Acute respiratory distress secondary to right lower lobe pneumonia, community-acquired/aspiration Acute mild exacerbation of COPD Concern for possible aspiration.  Unasyn and azithromycin transition to Augmentin and azithromycin.  We will give him 5 more day course at the time of discharge 40 mg of prednisone for next 3 days then transition to 20 mg daily which is his home dose Advised to use bronchodilators.3  Alcohol use disorder Counseled to quit drinking alcohol.  Advised to continue thiamine, folate and multivitamin  Weakness -He did well with physical therapy.  Tobacco use Marijuana use Cessation discussed on admission  Consultations:  None  Subjective: Still having some cough and congestion but he is adamant about going home today as he is feeling much better than from admission.  I  recommended he stay in the hospital for at least 1 more day to help him feel even better.  He wished to go home. Ambulating in the hallway without additional saturating greater than 90% on room air.   Since discharge this patient went to urgent care on 21 September with an outbreak of shingles over his left lower back.  He is taken a course of valacyclovir and prednisone.  He has severe pain.  He was given gabapentin but this caused hallucinations and then we tried tramadol but this caused severe nausea and vomiting.  Apparently the patient cannot take Tylenol or nonsteroidal anti-inflammatories as well.  The patient states his breathing is been labored.  He still has productive cough of white mucus.  He has no fever.  He has significant back pain because of the shingles. The patient is maintaining the [facility] and Incruse as well as his antihypertensive medications and vitamins     10/12/2018  This patient is seen in return follow-up for acute shingles with postherpetic neuropathy, recent pneumonia, COPD exacerbation, and hypertension.  Patient is markedly improved from the last visit.  He has less cough less shortness of breath.  He has improved pain around the area of the shingles.  He is taking amitriptyline 25 mg at bedtime  The patient notes less pain.  He does state he had been using the tramadol without nausea or vomiting.  I asked if patient would be interested in the para medicine program and expressed interest in the community para medicine program  11/23/2018 Patient seen in return follow-up and has noted increasing shortness of breath and we had to resume prednisone this patient.  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  He does maintain his inhalers.  He is drinking 4 beers a night.  He is not smoking.  Note the patient continues to live in a home that has standing water under the foundation and in several the rooms and mold on the carpet.  We have connected this patient with legal  aid to see if they can assist him in getting into better housing.  The patient's not had previous allergy testing  Shortness of Breath This is a chronic problem. The current episode started more than 1 year ago. The problem occurs daily (qhs shortness of breath). The problem has been gradually improving. Associated symptoms include PND and sputum production. Pertinent negatives include no chest pain, claudication, fever, headaches, hemoptysis, leg pain, leg swelling, orthopnea, rash, sore throat, syncope, vomiting or wheezing. The symptoms are aggravated by any activity and weather changes. Associated symptoms comments: Pain radiates to arm Legs go to sleep No dysphagia No gerd . Risk factors include smoking. He has tried beta agonist inhalers, oral steroids and steroid inhalers for the symptoms. The treatment provided moderate relief. His past medical history is significant for COPD and pneumonia. There is no history of asthma, CAD, DVT, a heart failure or PE.    Past Medical History:  Diagnosis Date   Acute on chronic respiratory failure with hypoxia ([place]) 06/08/2013   CAP (community acquired pneumonia) 05/17/2018   See admit 05/17/18 ? 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   Tick bite 06/03/2018     Family History  Problem Relation Age of Onset   Heart disease Father    COPD Sister      Social History   Socioeconomic History   Marital status: Divorced    Spouse name: Not on file   Number of children: Not on file   Years of education: Not on file   Highest education level: Not on file  Occupational History   Occupation: Dealer   Occupation: Sport and exercise psychologist strain: Hard    Food insecurity    Worry: Not on file    Inability: Never true   Transportation needs    Medical: No    Non-medical: No  Tobacco Use   Smoking status: Former Smoker    Packs/day: 1.50    Years: 40.00    Pack years: 60.00    Types: Cigarettes    Quit date: 2014    Years since quitting: 6.8   Smokeless tobacco: Current User    Types: Chew  Substance and Sexual Activity   Alcohol use: Yes    Alcohol/week: 28.0 standard drinks    Types: 28 Cans of beer per week    Comment: 4 beers a night; + jitters with not drinking, denies DTs or seizures   Drug use: Yes  Types: Marijuana    Comment: daily; quit using crack and methamphetamine about 5 months ago    Sexual activity: Not Currently    Partners: Female  Lifestyle   Physical activity    Days per week: 0 days    Minutes per session: 0 min   Stress: To some extent  Relationships   Social connections    Talks on phone: Three times a week    Gets together: Once a week    Attends religious service: 1 to 4 times per year    Active member of club or organization: Yes    Attends meetings of clubs or organizations: 1 to 4 times per year    Relationship status: Divorced   Intimate partner violence    Fear of current or ex partner: Not on file    Emotionally abused: Not on file    Physically abused: Not on file    Forced sexual activity: Not on file  Other Topics Concern   Not on file  Social History Narrative   Lives alone near [place]     Allergies  Allergen Reactions   Gabapentin Other (See Comments)    hallucinations     Outpatient Medications Prior to Visit  Medication Sig Dispense Refill   albuterol (PROVENTIL HFA;VENTOLIN HFA) 108 (90 Base) MCG/ACT inhaler Inhale 1-2 puffs into the lungs every 6 (six) hours as needed for wheezing or shortness of breath. 1 Inhaler 5   albuterol (PROVENTIL) (2.5 MG/3ML) 0.083% nebulizer solution Take 3 mLs (2.5 mg total) by nebulization every 6 (six) hours as needed  for wheezing or shortness of breath. 419 mL 1   folic acid (FOLVITE) 1 MG tablet Take 1 tablet (1 mg total) by mouth daily. 90 tablet 1   losartan (COZAAR) 100 MG tablet TAKE 1 TABLET (100 MG TOTAL) BY MOUTH DAILY. 30 tablet 2   mometasone-formoterol (DULERA) 200-5 MCG/ACT AERO Inhale 2 puffs into the lungs 2 (two) times daily. 13 g 2   predniSONE (DELTASONE) 10 MG tablet Take 4 tablets daily for 5 days then two tablets daily and stay at that dose 60 tablet 1   Respiratory Therapy Supplies (FLUTTER) DEVI Use 4 times daily 1 each 0   Spacer/Aero-Holding Chambers (AEROCHAMBER MV) inhaler Use as instructed 1 each 0   thiamine (VITAMIN B-1) 100 MG tablet Take 1 tablet (100 mg total) by mouth daily. 30 tablet 3   umeclidinium bromide (INCRUSE ELLIPTA) 62.5 MCG/INH AEPB Inhale 1 puff into the lungs daily. 30 each 5   amitriptyline (ELAVIL) 25 MG tablet Take 1 tablet (25 mg total) by mouth at bedtime. (Patient not taking: Reported on 10/23/2018) 30 tablet 1   calamine lotion Apply 1 application topically as needed for itching. 120 mL 0   doxycycline (VIBRAMYCIN) 100 MG capsule Take 1 capsule (100 mg total) by mouth 2 (two) times daily. 20 capsule 0   ondansetron (ZOFRAN) 4 MG tablet Take 1 tablet (4 mg total) by mouth every 8 (eight) hours as needed for nausea or vomiting. (Patient not taking: Reported on 10/31/2018) 20 tablet 0   No facility-administered medications prior to visit.      Review of Systems  Constitutional: Negative for fever.  HENT: Negative for sinus pressure, sinus pain, sore throat and trouble swallowing.   Eyes: Negative.   Respiratory: Positive for cough, sputum production, chest tightness and shortness of breath. Negative for hemoptysis and wheezing.   Cardiovascular: Positive for PND. Negative for chest pain, palpitations, orthopnea, claudication, leg  swelling and syncope.  Gastrointestinal: Negative for nausea and vomiting.  Genitourinary: Negative.     Musculoskeletal: Positive for back pain.  Skin: Negative for rash.  Neurological: Negative for headaches.  Psychiatric/Behavioral: Negative for hallucinations. The patient is not nervous/anxious and is not hyperactive.        Objective:   Physical Exam Vitals:   11/23/18 1341  BP: 136/75  Pulse: (!) 107  Resp: 17  Temp: (!) 97.3 F (36.3 C)  TempSrc: Temporal  SpO2: 94%  Weight: 180 lb 12.8 oz (82 kg)    Gen: , well-nourished, in no distress,   ENT: No lesions,  mouth clear,  oropharynx clear, no postnasal drip  Neck: No JVD, no TMG, no carotid bruits  Lungs: No use of accessory muscles, no dullness to percussion, distant breath sounds bilateral , decreased rhonchi  Cardiovascular: RRR, heart sounds normal, no murmur or gallops, no peripheral edema  Abdomen: soft and NT, no HSM,  BS normal  Musculoskeletal: No deformities, no cyanosis or clubbing  Neuro: alert, non focal  Skin: Warm, no lesions herpetiform lesions seen in the right lower back in the thoracic lower dermatomes and lumbar dermatomes which are now drying  Media Information    Document Information  Photos    11/02/2018 14:43  Attached To:  [place], MD   [facility]     BMP Latest Ref Rng & Units 09/12/2018 09/11/2018 08/11/2018  Glucose 70 - 99 mg/dL 121(H) 139(H) 195(H)  BUN 6 - 20 mg/dL 18 18 16   Creatinine 0.61 - 1.24 mg/dL 0.61 0.87 0.86  BUN/Creat Ratio 9 - 20 - - -  Sodium 135 - 145 mmol/L 138 140 138  Potassium 3.5 - 5.1 mmol/L 4.3 4.3 4.2  Chloride 98 - 111 mmol/L 106 103 102  CO2 22 - 32 mmol/L 24 27 27   Calcium 8.9 - 10.3 mg/dL 8.4(L) 9.0 8.7(L)   Hepatic Function Panel     Component Value Date/Time   PROT 6.0 06/20/2018 1159   ALBUMIN 4.1 06/20/2018 1159   AST 34 06/20/2018 1159   ALT 37 06/20/2018 1159   ALT 62 (H) 06/23/2016 1226   ALKPHOS 43 06/20/2018 1159   BILITOT 0.4 06/20/2018 1159   BILIDIR 0.12 04/16/2016  1601   IBILI NOT CALCULATED 03/26/2016 1845   CBC Latest Ref Rng & Units 09/13/2018 09/12/2018 09/11/2018  WBC 4.0 - 10.5 K/uL 20.2(H) 19.6(H) 26.1(H)  Hemoglobin 13.0 - 17.0 g/dL 13.9 13.8 14.7  Hematocrit 39.0 - 52.0 % 42.5 43.1 44.7  Platelets 150 - 400 K/uL 240 225 239   Chest x-ray reviewed today shows no active process no pneumonia     Assessment & Plan:  I personally reviewed all images and lab data in the [facility] system as well as any outside material available during this office visit and agree with the  radiology impressions.   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  Diagnoses and all orders  for this visit:  COPD mixed type ([place])  Mold exposure -     Cancel: Allergy Panel 11, Mold Group -     IgE -     Allergen Profile, Mold  Allergy to mold -     Allergen Profile, Mold  Post-herpetic polyneuropathy

## 2018-11-25 LAB — ALLERGEN PROFILE, MOLD
Alternaria Alternata IgE: <0.1 kU/L
Aspergillus Fumigatus IgE: <0.1 kU/L
Aureobasidi Pullulans IgE: <0.1 kU/L
Candida Albicans IgE: <0.1 kU/L
Cladosporium Herbarum IgE: <0.1 kU/L
M009-IgE Fusarium proliferatum: <0.1 kU/L
M014-IgE Epicoccum purpur: <0.1 kU/L
Mucor Racemosus IgE: <0.1 kU/L
Penicillium Chrysogen IgE: <0.1 kU/L
Phoma Betae IgE: <0.1 kU/L
Setomelanomma Rostrat: <0.1 kU/L
Stemphylium Herbarum IgE: <0.1 kU/L

## 2018-11-25 LAB — IGE: IgE (Immunoglobulin E), Serum: 160 IU/mL (ref 6–495)

## 2018-11-29 NOTE — Progress Notes (Signed)
Patient notified of results & recommendations. Expressed understanding.

## 2018-12-04 MED FILL — ALBUTEROL SULFATE HFA 108 (: 108 (90 BAS | 25 days supply | Qty: 18 | Fill #3

## 2018-12-04 MED FILL — LOSARTAN POTASSIUM 100 MG T: 100 | 30 days supply | Qty: 30 | Fill #2

## 2018-12-04 MED FILL — ALBUTEROL SUL 2.5 MG/3 ML S: (2.5 MG/3ML | 7 days supply | Qty: 90 | Fill #3

## 2018-12-04 MED FILL — ?FOLIC ACID 1MG TAB: 1 | 30 days supply | Qty: 30 | Fill #2

## 2018-12-04 MED FILL — !INCRUSE ELLIPTA 62.5 MCG I: 62.5 | 30 days supply | Qty: 30 | Fill #3

## 2018-12-05 MED FILL — predniSONE 10 MG TABS: 10 | 25 days supply | Qty: 60 | Fill #1

## 2018-12-06 ENCOUNTER — Other Ambulatory Visit (HOSPITAL_COMMUNITY): Payer: Self-pay

## 2018-12-06 NOTE — Progress Notes (Signed)
Paramedicine Encounter    Patient ID: QUENTIN SHOREY, male    DOB: [birth year], 58 y.o.   MRN: [id number]   Patient Care Team: [name], MD as PCP - General (Pulmonary Disease) O'Neal, Cassie Freer, MD as PCP - Cardiology (Cardiology)  Patient Active Problem List   Diagnosis Date Noted  . 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 Marijuana abuse 08/09/2018  . Hypertension 05/17/2016  . Low back pain radiating to both legs 04/18/2016  . History of tobacco abuse 04/06/2016  . Alcohol abuse 04/06/2016  . COPD mixed type ([place]) 03/26/2016    Current Outpatient Medications:  .  albuterol (PROVENTIL HFA;VENTOLIN HFA) 108 (90 Base) MCG/ACT inhaler, Inhale 1-2 puffs into the lungs every 6 (six) hours as needed for wheezing or shortness of breath., Disp: 1 Inhaler, Rfl: 5 .  albuterol (PROVENTIL) (2.5 MG/3ML) 0.083% nebulizer solution, Take 3 mLs (2.5 mg total) by nebulization every 6 (six) hours as needed for wheezing or shortness of breath., Disp: 150 mL, Rfl: 1 .  folic acid (FOLVITE) 1 MG tablet, Take 1 tablet (1 mg total) by mouth daily., Disp: 90 tablet, Rfl: 1 .  losartan (COZAAR) 100 MG tablet, TAKE 1 TABLET (100 MG TOTAL) BY MOUTH DAILY., Disp: 30 tablet, Rfl: 2 .  mometasone-formoterol (DULERA) 200-5 MCG/ACT AERO, Inhale 2 puffs into the lungs 2 (two) times daily., Disp: 13 g, Rfl: 2 .  predniSONE (DELTASONE) 10 MG tablet, Take 4 tablets daily for 5 days then two tablets daily and stay at that dose, Disp: 60 tablet, Rfl: 1 .  Respiratory Therapy Supplies (FLUTTER) DEVI, Use 4 times daily, Disp: 1 each, Rfl: 0 .  Spacer/Aero-Holding Chambers (AEROCHAMBER MV) inhaler, Use as instructed, Disp: 1 each, Rfl: 0 .  thiamine (VITAMIN B-1) 100 MG tablet, Take 1 tablet (100 mg total) by mouth daily., Disp: 30 tablet, Rfl: 3 .  umeclidinium bromide (INCRUSE ELLIPTA) 62.5 MCG/INH AEPB, Inhale 1 puff into the lungs daily., Disp: 30 each, Rfl: 5 Allergies  Allergen Reactions  . Gabapentin Other (See  Comments)    hallucinations      Social History   Socioeconomic History  . Marital status: Divorced    Spouse name: Not on file  . Number of children: Not on file  . Years of education: Not on file  . Highest education level: Not on file  Occupational History  . Occupation: Dealer  . Occupation: [employer]  . Financial resource strain: Hard  . Food insecurity    Worry: Not on file    Inability: Never true  . Transportation needs    Medical: No    Non-medical: No  Tobacco Use  . Smoking status: Former Smoker    Packs/day: 1.50    Years: 40.00    Pack years: 60.00    Types: Cigarettes    Quit date: 2014    Years since quitting: 6.9  . Smokeless tobacco: Current User    Types: Chew  Substance and Sexual Activity  . Alcohol use: Yes    Alcohol/week: 28.0 standard drinks    Types: 28 Cans of beer per week    Comment: 4 beers a night; + jitters with not drinking, denies DTs or seizures  . Drug use: Yes    Types: Marijuana    Comment: daily; quit using crack and methamphetamine about 5 months ago   . Sexual activity: Not Currently    Partners: Female  Lifestyle  . Physical activity    Days  per week: 0 days    Minutes per session: 0 min  . Stress: To some extent  Relationships  . Social Herbalist on phone: Three times a week    Gets together: Once a week    Attends religious service: 1 to 4 times per year    Active member of club or organization: Yes    Attends meetings of clubs or organizations: 1 to 4 times per year    Relationship status: Divorced  . Intimate partner violence    Fear of current or ex partner: Not on file    Emotionally abused: Not on file    Physically abused: Not on file    Forced sexual activity: Not on file  Other Topics Concern  . Not on file  Social History Narrative   Lives alone near [place]    Physical Exam      Future Appointments  Date Time Provider [place]  12/14/2018  2:30 PM  PCE-COVERING PROVIDER PCE-PCE None    BP (!) 156/88   Pulse 86   Resp 18   SpO2 98%   Pt reports he is doing well. He states his breathing is "doing the same" He is coughing, non-productive.  Lungs clear. He denies c/p, no dizziness.  Pt states he has all his meds, he got his refills p/u yesterday. He did not have his meds with him at this time.  He reports he has not missed any meds.  He received letters from [facility] office--he has no forms of ID, no banking accounts-I called his case worker and he advised to write on there that he does not have any of that info and to sign those papers and return. So we did that.   [name], [facility] Paramedic  12/06/18

## 2018-12-11 ENCOUNTER — Telehealth: Payer: Self-pay

## 2018-12-11 NOTE — Telephone Encounter (Signed)
Call received from [facility]/Legal Aid.   She explained that she spoke to patient's sister, James [name], about the conditions of James [name]'s home. James [name] stated that the home needs: heat, flooring, a new hot water heater, leaks need to be repaired and insulation installed. His sister also explained that she set boundaries with the patient that she will not allow him to live with her.   Mickel Baas discussed the situation with Legal [employer] housing attorney, who determined that there is no binding landlord/tenant agreement so they would not be able to legally demand repair of the home. The other legal issue was that the patient has not initiated these concerns about his housing. Mickel Baas has requested suggestions for next steps from the attorney and will contact this CM with the options.    Mickel Baas also noted that she will continue to assist his sister with navigating the disability application process

## 2018-12-12 NOTE — Telephone Encounter (Signed)
Thank you for this update

## 2018-12-13 ENCOUNTER — Telehealth: Payer: Self-pay

## 2018-12-13 ENCOUNTER — Other Ambulatory Visit (HOSPITAL_COMMUNITY): Payer: Self-pay

## 2018-12-13 NOTE — Telephone Encounter (Signed)
Called patient to do their pre-visit COVID screening.  Call went to voicemail. Unable to do prescreening.  Did call sister as well to remind her of appointment.

## 2018-12-13 NOTE — Progress Notes (Signed)
[name]  Subjective:    Patient ID: [name], male    DOB: 1960-12-14, 58 y.o.   MRN: [id number]  58 y.o.M with advanced Copd   I saw this patient a week ago and prescribed antibiotics and prednisone for what I perceived to be persisting pneumonia.  The patient had a slight improvement but then worsened and came to the emergency room the end of the week and was admitted for 3 days for COPD exacerbation and lower lobe pneumonia.  Chest x-ray did show persistent infiltrates in the lower lobes.  The patient is now discharged and returns to the office for post hospital follow-up.  Below is the discharge summary Dc summary : Admit date: 07/31/2018 Discharge date: 08/01/2018  Time spent: 45 minutes  Recommendations for Outpatient Follow-up:  Patient will be discharged to home.  Patient will need to follow up with primary care provider within one week of discharge.  Patient should continue medications as prescribed.  Patient should follow a heart healthy diet.   Discharge Diagnoses:  Acute on chronic respiratory failure with hypoxia secondary to COPD Exacerbation and pneumonia Essential hypertension  Discharge Condition: Stable  Diet recommendation: heart healthy  Filed Weights  07/31/18 0231 07/31/18 0602 Weight: 85.3 kg 83.4 kg   History of present illness:  on 07/31/2018 by Dr. [name] a 58 y.o.malewith medical history significant ofCOPD. Patient has had 3 prior admits for COPD / PNA thus far this year, most recently in May.  Patient has been being treated for COPD exacerbation and BLL CAP as outpatient by [name] with levaquin and prednisone. Just finished levaquin yesterday. Prednisone remained at 40mg  daily with plans to initiate a slow taper.  Despite this, patient developed severe SOB and respiratory distress this evening. EMS called and patient noted to be satting 80% on RA. He is not on chronic O2 for his COPD.  Hospital Course:    Acute on chronic respiratory failure with hypoxia secondary to COPD Exacerbation and pneumonia -Patient noted to have oxygen saturations in the 80s on room air -Chest x-ray noted for pneumonia -Was recently placed on Levaquin and steroids by PCP approximately 1 week ago -On admission, patient continued to have wheezing well into his hospital stay day 1.  Patient currently still has some wheezing however has improved. -Was placed on Solu-Medrol, along with nebulizer treatments, azithromycin and ceftriaxone -Patient did require oxygen upon admission however has been able to ambulate and maintain oxygen saturations in the high 90s on room air. -Will discharge patient with prednisone taper, azithromycin and Ceftin. -Given that patient had oxygen saturations in the 80s on admission, it was thought that he would require several days in the hospital. Patient actually improved quicker than expected.  Essential hypertension -Continue losartan  Since discharge the patient is tried to go back to work but has extreme difficulty in the heat with this.  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  10/05/2018 This patient's not been seen since the end of July he had failed several follow-up visits.  He did see Dr. [name] end of August who did not make major changes in  his medication program.  He is to remain on [name] and Incruse.  The patient after being admitted in July got readmitted early August and then again in early September.  Discharge summaries are as noted below.  During the August admission the patient underwent bronchoscopy and had mucous plugging removed the culture showed normal  flora Note this patient has not been actively smoking recently Admit date: 08/09/2018 Discharge date: 08/11/2018  Discharge Diagnoses:  Acute on chronic hypoxic respiratory failure/sepsis due to multifocal pneumonia COPD Alcohol/marijuana use Essential hypertension  History of present illness:  on 08/09/2018 by Dr. [name] a 58 y.o.malewith medical history significant ofCOPD and ETOH dependence presenting with respiratory distress.He woke up about 530 this AM and couldn't breathe. This just keeps happening. He felt similarly when hospitalized last week. He has been doing fine until today. No h/o OSA, not on CPAP. Not on home O2. +cough, productive of yellowish sputum. It is sometimes hard to get the sputum up. No fever. +wheezing since his last hospitalization. Quit smoking in 2014. He lives in a non-air-conditioned log cabin.  He was hospitalized from 7/27-28 for acute on chronic respiratory failure associated with COPD exacerbation and PNA. This was his 4th admission this year. He was seen by [name] on 7/30 with plan for completion of course of Keflex and prednisone, as well as prednisone taper.  Hospital Course:  Acute on chronic hypoxic respiratory failure/sepsis due to multifocal pneumonia -Was recently hospitalized in late July for pneumonia and COPD exacerbation. Was discharged with antibiotics as well as prednisone taper. Patient followed up with [name] on 08/03/2018 with completion of Keflex and prednisone as well. -This is his fourth admission this year for similar presentation -Patient presented with cough and shortness of breath -Chest x-rayon admissionfindings consistent with right-sided multifocal pneumonia -CTA chest showed multifocal pneumonia on the right. COPD including emphysema. Negative for PE. -COVID negative -Upon admission, patient had leukocytosis, fever, tachycardia, tachypnea- all resolving  -Noted to be  hypoxic 88% on room air in the ED -Urine strep pneumoniaand legionellaantigensnegative -Sputum culture: Few GPC, GVR, rare yeast -Blood cultures show no growth -was placed on vancomycin, Zosyn, Solu-Medrol, supplemental oxygen -S/p bronchoscopy 8/6, pending culture results -CXR obtained after bronch on 8/6: almost complete clearing of the hazy infiltrates in the right lung -Discussed with Dr. Tamala Julian (pulm), recommended prednisone taper starting with prednisone 40mg  daily x 1 week, taper as an outpatient, along with Augmentin. He will follow up with the patient in one week. Given that infection cleared on xray so quickly, question if this is inflammatory vs infection.  -patient able to ambulate today without oxygen and maintained oxygen saturations in the mid 90s- therefore patient does not qualify for home oxygen -patient also had a swallow eval- mild aspiration risk  COPD -Continue nebulizer treatments, steroids, inhalers -Incentive spirometry, flutter valve  Alcohol/marijuana use -Discussed cessation -was placed on continue CIWA protocol  Essential hypertension -Continue Cozaar  Consultants PCCM- pulmonology   Procedures Bronchoscopy   Recommendations for Outpatient Follow-up:  1. Follow up with PCP in 1-2 weeks 2. Please obtain BMP/CBC in one week your next doctors visit.  3. Augmentin and azithromycin for 5 days 4. Advised to continue using bronchodilators every 4-6 hours for the next 4-5 days and then transition to as needed. 5. Advised to take his home prednisone, for next 3 days advised to increase it to 40 mg twice daily then resume back is 20 mg daily  Discharge Condition: Stable  CODE STATUS: Full code Diet recommendation: 2 g salt   DC summary from 09/11/18  Adm 9/7  D/C 09/13/18 Brief/Interim Summary: Mortimer Fries [name] a 58 y.o.malewith history ofCOPD on prednisone, HTN, alcohol use disorder, marijuana use,tobacco user. Patient presented secondary to  dyspnea and found to have evidence of pneumonia and a COPD exacerbation in addition to sepsis physiology on admission.  There is concerns of possible right lower lobe pneumonia therefore started on Unasyn and azithromycin due to concerns of aspiration.  This was transitioned to Augmentin and azithromycin for total of 5 days at time of discharge.  Secondary to infection, he was having COPD exacerbation therefore on bronchodilators.  He started improving and on the day of discharge he was adamant about going home as he was feeling much better.  He was still having abnormal breath sounds and cough therefore I had requested him to stay another day in the hospital but still wanted to go home.  Instructions were given to him take 40 mg of prednisone for next 3 days and then he can resume back his 20 mg daily.  Advised... home bronchodilators periodically every 4-6 hours for next 4-5 days and then transition to as needed. Advised to follow-up with his PCP in about 1 week.   Discharge Diagnoses:  Active Problems:   Hypertension   COPD exacerbation ([place])   Marijuana abuse   Pneumonia   Sepsis ([place])  Acute respiratory distress secondary to right lower lobe pneumonia, community-acquired/aspiration Acute mild exacerbation of COPD Concern for possible aspiration.  Unasyn and azithromycin transition to Augmentin and azithromycin.  We will give him 5 more day course at the time of discharge 40 mg of prednisone for next 3 days then transition to 20 mg daily which is his home dose Advised to use bronchodilators.3  Alcohol use disorder Counseled to quit drinking alcohol.  Advised to continue thiamine, folate and multivitamin  Weakness -He did well with physical therapy.  Tobacco use Marijuana use Cessation discussed on admission  Consultations:  None  Subjective: Still having some cough and congestion but he is adamant about going home today as he is feeling much better than from admission.  I  recommended he stay in the hospital for at least 1 more day to help him feel even better.  He wished to go home. Ambulating in the hallway without additional saturating greater than 90% on room air.   Since discharge this patient went to urgent care on 21 September with an outbreak of shingles over his left lower back.  He is taken a course of valacyclovir and prednisone.  He has severe pain.  He was given gabapentin but this caused hallucinations and then we tried tramadol but this caused severe nausea and vomiting.  Apparently the patient cannot take Tylenol or nonsteroidal anti-inflammatories as well.  The patient states his breathing is been labored.  He still has productive cough of white mucus.  He has no fever.  He has significant back pain because of the shingles. The patient is maintaining the [facility] and Incruse as well as his antihypertensive medications and vitamins     10/12/2018  This patient is seen in return follow-up for acute shingles with postherpetic neuropathy, recent pneumonia, COPD exacerbation, and hypertension.  Patient is markedly improved from the last visit.  He has less cough less shortness of breath.  He has improved pain around the area of the shingles.  He is taking amitriptyline 25 mg at bedtime  The patient notes less  pain.  He does state he had been using the tramadol without nausea or vomiting.  I asked if patient would be interested in the para medicine program and expressed interest in the community para medicine program  11/23/2018 Patient seen in return follow-up and has noted increasing shortness of breath and we had to resume prednisone this patient.  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  He does maintain his inhalers.  He is drinking 4 beers a night.  He is not smoking.  Note the patient continues to live in a home that has standing water under the foundation and in several the rooms and mold on the carpet.  We have connected this patient with legal  aid to see if they can assist him in getting into better housing.  The patient's not had previous allergy testing   12/13/2018 The patient notes increased shortness of breath, wheezing and cough productive of thick yellow mucus.  He is using the flutter valve but is not being is productive with this.  He denies chest pain or fever.  He is down to 2 a day on the prednisone.  He is maintaining his inhalers. Apparently there is repair is being done on the patient's home and he is decided to stay in the home  Shortness of Breath This is a chronic problem. The current episode started more than 1 year ago. The problem occurs daily (qhs shortness of breath). The problem has been rapidly worsening. Associated symptoms include chest pain, PND, a sore throat, sputum production and wheezing. Pertinent negatives include no claudication, fever, headaches, hemoptysis, leg pain, leg swelling, orthopnea, rash, syncope or vomiting. The symptoms are aggravated by any activity and weather changes. Associated symptoms comments: Pain radiates to arm Legs go to sleep No dysphagia No gerd . Risk factors include smoking. He has tried beta agonist inhalers, oral steroids and steroid inhalers for the symptoms. The treatment provided moderate relief. His past medical history is significant for COPD and pneumonia. There is no history of asthma, CAD, DVT, a heart failure or PE.    Past Medical History:  Diagnosis Date   Acute on chronic respiratory failure with hypoxia ([place]) 06/08/2013   CAP (community acquired pneumonia) 05/17/2018   See admit 05/17/18 ? 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   Tick bite 06/03/2018     Family History  Problem Relation  Age of Onset   Heart disease Father    COPD Sister      Social History   Socioeconomic History   Marital status: Divorced    Spouse name: Not on file   Number of children: Not on file   Years of education: Not on file   Highest education level: Not on file  Occupational History   Occupation: Dealer   Occupation: Curator  Tobacco Use   Smoking status: Former Smoker    Packs/day: 1.50    Years: 40.00    Pack years: 60.00    Types: Cigarettes    Quit date: 2014    Years since quitting: 6.9   Smokeless tobacco: Current User    Types: Chew  Substance and Sexual Activity   Alcohol use: Yes  Alcohol/week: 28.0 standard drinks    Types: 28 Cans of beer per week    Comment: 4 beers a night; + jitters with not drinking, denies DTs or seizures   Drug use: Yes    Types: Marijuana    Comment: daily; quit using crack and methamphetamine about 5 months ago    Sexual activity: Not Currently    Partners: Female  Other Topics Concern   Not on file  Social History Narrative   [place] Determinants of Health   Financial Resource Strain: High Risk   Difficulty of Paying Living Expenses: Hard  Food Insecurity: Unknown   Worried About Charity fundraiser in the Last Year: Not on file   [employer] of Food in the Last Year: Never true  Transportation Needs: No Transportation Needs   Lack of Transportation (Medical): No   Lack of Transportation (Non-Medical): No  Physical Activity: Inactive   Days of Exercise per Week: 0 days   Minutes of Exercise per Session: 0 min  Stress: Stress Concern Present   Feeling of Stress : To some extent  Social Connections: Slightly Isolated   Frequency of Communication with Friends and Family: Three times a week   Frequency of Social Gatherings with Friends and Family: Once a week   Attends Religious Services: 1 to 4 times per year   Active Member of [employer] or Organizations: Yes   Attends Theatre manager Meetings: 1 to 4 times per year   Marital Status: Divorced  Human resources officer Violence:    Fear of Current or Ex-Partner: Not on file   Emotionally Abused: Not on file   Physically Abused: Not on file   Sexually Abused: Not on file     Allergies  Allergen Reactions   Gabapentin Other (See Comments)    hallucinations     Outpatient Medications Prior to Visit  Medication Sig Dispense Refill   albuterol (PROVENTIL HFA;VENTOLIN HFA) 108 (90 Base) MCG/ACT inhaler Inhale 1-2 puffs into the lungs every 6 (six) hours as needed for wheezing or shortness of breath. 1 Inhaler 5   albuterol (PROVENTIL) (2.5 MG/3ML) 0.083% nebulizer solution Take 3 mLs (2.5 mg total) by nebulization every 6 (six) hours as needed for wheezing or shortness of breath. 130 mL 1   folic acid (FOLVITE) 1 MG tablet Take 1 tablet (1 mg total) by mouth daily. 90 tablet 1   losartan (COZAAR) 100 MG tablet TAKE 1 TABLET (100 MG TOTAL) BY MOUTH DAILY. 30 tablet 2   mometasone-formoterol (DULERA) 200-5 MCG/ACT AERO Inhale 2 puffs into the lungs 2 (two) times daily. 13 g 2   Respiratory Therapy Supplies (FLUTTER) DEVI Use 4 times daily 1 each 0   Spacer/Aero-Holding Chambers (AEROCHAMBER MV) inhaler Use as instructed 1 each 0   thiamine (VITAMIN B-1) 100 MG tablet Take 1 tablet (100 mg total) by mouth daily. 30 tablet 3   umeclidinium bromide (INCRUSE ELLIPTA) 62.5 MCG/INH AEPB Inhale 1 puff into the lungs daily. 30 each 5   predniSONE (DELTASONE) 10 MG tablet Take 4 tablets daily for 5 days then two tablets daily and stay at that dose 60 tablet 1   No facility-administered medications prior to visit.     Review of Systems  Constitutional: Negative for fever.  HENT: Positive for sore throat. Negative for sinus pressure, sinus pain and trouble swallowing.   Eyes: Negative.   Respiratory: Positive for cough, sputum production, chest tightness, shortness of breath and  wheezing. Negative for  hemoptysis.   Cardiovascular: Positive for chest pain and PND. Negative for palpitations, orthopnea, claudication, leg swelling and syncope.  Gastrointestinal: Negative for nausea and vomiting.  Genitourinary: Negative.   Musculoskeletal: Positive for back pain.  Skin: Negative for rash.  Neurological: Negative for headaches.  Psychiatric/Behavioral: Negative for hallucinations. The patient is not nervous/anxious and is not hyperactive.        Objective:   Physical Exam  Vitals:   12/14/18 1430  BP: (!) 155/79  Pulse: (!) 113  Resp: 17  Temp: (!) 97.3 F (36.3 C)  TempSrc: Temporal  SpO2: 91%  Weight: 177 lb 6.4 oz (80.5 kg)    Gen: , well-nourished, in no distress,   ENT: No lesions,  mouth clear,  oropharynx clear, no postnasal drip  Neck: No JVD, no TMG, no carotid bruits  Lungs: No use of accessory muscles, no dullness to percussion, inspiratory and expiratory wheeze with poor airflow  Cardiovascular: RRR, heart sounds normal, no murmur or gallops, no peripheral edema  Abdomen: soft and NT, no HSM,  BS normal  Musculoskeletal: No deformities, no cyanosis or clubbing  Neuro: alert, non focal  Skin: Warm, no rash Media Information    Document Information  Photos    11/02/2018 14:43  Attached To:  [place], MD   [facility]     BMP Latest Ref Rng & Units 09/12/2018 09/11/2018 08/11/2018  Glucose 70 - 99 mg/dL 121(H) 139(H) 195(H)  BUN 6 - 20 mg/dL 18 18 16   Creatinine 0.61 - 1.24 mg/dL 0.61 0.87 0.86  BUN/Creat Ratio 9 - 20 - - -  Sodium 135 - 145 mmol/L 138 140 138  Potassium 3.5 - 5.1 mmol/L 4.3 4.3 4.2  Chloride 98 - 111 mmol/L 106 103 102  CO2 22 - 32 mmol/L 24 27 27   Calcium 8.9 - 10.3 mg/dL 8.4(L) 9.0 8.7(L)   Hepatic Function Panel     Component Value Date/Time   PROT 6.0 06/20/2018 1159   ALBUMIN 4.1 06/20/2018 1159   AST 34 06/20/2018 1159   ALT 37 06/20/2018 1159   ALT 62 (H)  06/23/2016 1226   ALKPHOS 43 06/20/2018 1159   BILITOT 0.4 06/20/2018 1159   BILIDIR 0.12 04/16/2016 1601   IBILI NOT CALCULATED 03/26/2016 1845   CBC Latest Ref Rng & Units 09/13/2018 09/12/2018 09/11/2018  WBC 4.0 - 10.5 K/uL 20.2(H) 19.6(H) 26.1(H)  Hemoglobin 13.0 - 17.0 g/dL 13.9 13.8 14.7  Hematocrit 39.0 - 52.0 % 42.5 43.1 44.7  Platelets 150 - 400 K/uL 240 225 239   Chest x-ray reviewed today shows no active process no pneumonia     Assessment & Plan:  I personally reviewed all images and lab data in the [facility] system as well as any outside material available during this office visit and agree with the  radiology impressions.   COPD exacerbation (HCC) Acute COPD exacerbation purulent bronchitis  Plan will be to repulse prednisone 40 mg a day and also administer Solu-Medrol 125 mg  Patient will taper prednisone down to 20 mg daily and hold and administer Levaquin for 5 days 500 mg daily  All other medications remain the same   Eldra was seen today for copd.  Diagnoses and all orders for this visit:  COPD exacerbation ([place])  COPD mixed type ([place]) -     methylPREDNISolone sodium succinate (SOLU-MEDROL) 125 mg/2 mL injection 125 mg  Allergy to mold  Other orders -     levofloxacin (LEVAQUIN) 500 MG tablet; Take 1 tablet (500 mg total) by mouth daily. -     predniSONE (DELTASONE) 10 MG tablet; Take 4 tablets daily for 5 days and then two a day and STAY   Return 1 week for recheck

## 2018-12-13 NOTE — Progress Notes (Signed)
Paramedicine Encounter    Patient ID: James [name], male    DOB: 1960/05/09, 58 y.o.   MRN: [id number]   Patient Care Team: [name], MD as PCP - General (Pulmonary Disease) O'Neal, Cassie Freer, MD as PCP - Cardiology (Cardiology)  Patient Active Problem List   Diagnosis Date Noted  . 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 Marijuana abuse 08/09/2018  . Hypertension 05/17/2016  . Low back pain radiating to both legs 04/18/2016  . History of tobacco abuse 04/06/2016  . Alcohol abuse 04/06/2016  . COPD mixed type ([place]) 03/26/2016    Current Outpatient Medications:  .  albuterol (PROVENTIL HFA;VENTOLIN HFA) 108 (90 Base) MCG/ACT inhaler, Inhale 1-2 puffs into the lungs every 6 (six) hours as needed for wheezing or shortness of breath., Disp: 1 Inhaler, Rfl: 5 .  albuterol (PROVENTIL) (2.5 MG/3ML) 0.083% nebulizer solution, Take 3 mLs (2.5 mg total) by nebulization every 6 (six) hours as needed for wheezing or shortness of breath., Disp: 150 mL, Rfl: 1 .  folic acid (FOLVITE) 1 MG tablet, Take 1 tablet (1 mg total) by mouth daily., Disp: 90 tablet, Rfl: 1 .  losartan (COZAAR) 100 MG tablet, TAKE 1 TABLET (100 MG TOTAL) BY MOUTH DAILY., Disp: 30 tablet, Rfl: 2 .  mometasone-formoterol (DULERA) 200-5 MCG/ACT AERO, Inhale 2 puffs into the lungs 2 (two) times daily., Disp: 13 g, Rfl: 2 .  predniSONE (DELTASONE) 10 MG tablet, Take 4 tablets daily for 5 days then two tablets daily and stay at that dose, Disp: 60 tablet, Rfl: 1 .  Respiratory Therapy Supplies (FLUTTER) DEVI, Use 4 times daily, Disp: 1 each, Rfl: 0 .  Spacer/Aero-Holding Chambers (AEROCHAMBER MV) inhaler, Use as instructed, Disp: 1 each, Rfl: 0 .  thiamine (VITAMIN B-1) 100 MG tablet, Take 1 tablet (100 mg total) by mouth daily., Disp: 30 tablet, Rfl: 3 .  umeclidinium bromide (INCRUSE ELLIPTA) 62.5 MCG/INH AEPB, Inhale 1 puff into the lungs daily., Disp: 30 each, Rfl: 5 Allergies  Allergen Reactions  . Gabapentin Other (See  Comments)    hallucinations      Social History   Socioeconomic History  . Marital status: Divorced    Spouse name: Not on file  . Number of children: Not on file  . Years of education: Not on file  . Highest education level: Not on file  Occupational History  . Occupation: Dealer  . Occupation: [employer]  . Financial resource strain: Hard  . Food insecurity    Worry: Not on file    Inability: Never true  . Transportation needs    Medical: No    Non-medical: No  Tobacco Use  . Smoking status: Former Smoker    Packs/day: 1.50    Years: 40.00    Pack years: 60.00    Types: Cigarettes    Quit date: 2014    Years since quitting: 6.9  . Smokeless tobacco: Current User    Types: Chew  Substance and Sexual Activity  . Alcohol use: Yes    Alcohol/week: 28.0 standard drinks    Types: 28 Cans of beer per week    Comment: 4 beers a night; + jitters with not drinking, denies DTs or seizures  . Drug use: Yes    Types: Marijuana    Comment: daily; quit using crack and methamphetamine about 5 months ago   . Sexual activity: Not Currently    Partners: Female  Lifestyle  . Physical activity    Days  per week: 0 days    Minutes per session: 0 min  . Stress: To some extent  Relationships  . Social Herbalist on phone: Three times a week    Gets together: Once a week    Attends religious service: 1 to 4 times per year    Active member of club or organization: Yes    Attends meetings of clubs or organizations: 1 to 4 times per year    Relationship status: Divorced  . Intimate partner violence    Fear of current or ex partner: Not on file    Emotionally abused: Not on file    Physically abused: Not on file    Forced sexual activity: Not on file  Other Topics Concern  . Not on file  Social History Narrative   Lives alone near [place]    Physical Exam      Future Appointments  Date Time Provider [place]  12/14/2018  2:30 PM  PCE-COVERING PROVIDER [facility]CE[facility] None    BP (!) 148/90   Pulse 96   Resp 18   SpO2 98%   Pt reports doing ok, he states breathing is the same.  Still no more progress with renovating the home.  He denies any dizziness, no c/p.  Lungs clear. He received more paperwork from [facility] office stating he is not eligible for benefits due to not working under SS long enough.  meds verified.   [name], [facility] Paramedic  12/13/18

## 2018-12-14 ENCOUNTER — Ambulatory Visit (INDEPENDENT_AMBULATORY_CARE_PROVIDER_SITE_OTHER): Payer: Self-pay | Admitting: Critical Care Medicine

## 2018-12-14 ENCOUNTER — Other Ambulatory Visit: Payer: Self-pay

## 2018-12-14 VITALS — BP 155/79 | HR 113 | Temp 97.3°F | Resp 17 | Wt 177.4 lb

## 2018-12-14 DIAGNOSIS — J449 Chronic obstructive pulmonary disease, unspecified: Secondary | ICD-10-CM

## 2018-12-14 DIAGNOSIS — J441 Chronic obstructive pulmonary disease with (acute) exacerbation: Secondary | ICD-10-CM

## 2018-12-14 DIAGNOSIS — Z91048 Other nonmedicinal substance allergy status: Secondary | ICD-10-CM

## 2018-12-14 MED ORDER — PREDNISONE 10 MG PO TABS: ORAL_TABLET | ORAL | 1 refills | Status: DC

## 2018-12-14 MED ORDER — METHYLPREDNISOLONE SODIUM SUCC 125 MG IJ SOLR
125.0000 mg | Freq: Once | INTRAMUSCULAR | Status: AC
  Administered 2018-12-14: 15:00:00 125 mg via INTRAMUSCULAR

## 2018-12-14 MED ORDER — LEVOFLOXACIN 500 MG PO TABS: 500.0000 mg | ORAL_TABLET | Freq: Every day | ORAL | 0 refills | Status: DC

## 2018-12-14 NOTE — Patient Instructions (Addendum)
An injection of Solu-Medrol was given which is a steroid injection  Increase prednisone to 4-day for 5 days then reduce to 2 a day and hold at that level, additional prednisone was sent to the pharmacy  Begin Levaquin 1 daily for 5 days this is also sent to your pharmacy  No change in your other medications at this time  Return to see [name] 1 month weeks

## 2018-12-14 NOTE — Assessment & Plan Note (Signed)
Acute COPD exacerbation purulent bronchitis  Plan will be to repulse prednisone 40 mg a day and also administer Solu-Medrol 125 mg  Patient will taper prednisone down to 20 mg daily and hold and administer Levaquin for 5 days 500 mg daily  All other medications remain the same

## 2018-12-15 MED FILL — ?levoFLOXacin 500 MG TABS: 500 | 5 days supply | Qty: 5 | Fill #0

## 2018-12-19 NOTE — Progress Notes (Deleted)
olu  Subjective:    Patient ID: James [name], male    DOB: [birth year], 58 y.o.   MRN: [id number]  58 y.o.M with advanced Copd   I saw this patient a week ago and prescribed antibiotics and prednisone for what I perceived to be persisting pneumonia.  The patient had a slight improvement but then worsened and came to the emergency room the end of the week and was admitted for 3 days for COPD exacerbation and lower lobe pneumonia.  Chest x-ray did show persistent infiltrates in the lower lobes.  The patient is now discharged and returns to the office for post hospital follow-up.  Below is the discharge summary Dc summary : Admit date: 07/31/2018 Discharge date: 08/01/2018  Time spent: 45 minutes  Recommendations for Outpatient Follow-up:  Patient will be discharged to home.  Patient will need to follow up with primary care provider within one week of discharge.  Patient should continue medications as prescribed.  Patient should follow a heart healthy diet.   Discharge Diagnoses:  Acute on chronic respiratory failure with hypoxia secondary to COPD Exacerbation and pneumonia Essential hypertension  Discharge Condition: Stable  Diet recommendation: heart healthy  Filed Weights  07/31/18 0231 07/31/18 0602 Weight: 85.3 kg 83.4 kg   History of present illness:  on 07/31/2018 by James [name] a 58 y.o.malewith medical history significant ofCOPD. Patient has had 3 prior admits for COPD / PNA thus far this year, most recently in May.  Patient has been being treated for COPD exacerbation and BLL CAP as outpatient by James [name] with levaquin and prednisone. Just finished levaquin yesterday. Prednisone remained at 40mg  daily with plans to initiate a slow taper.  Despite this, patient developed severe SOB and respiratory distress this evening. EMS called and patient noted to be satting 80% on RA. He is not on chronic O2 for his COPD.  Hospital Course:    Acute on chronic respiratory failure with hypoxia secondary to COPD Exacerbation and pneumonia -Patient noted to have oxygen saturations in the 80s on room air -Chest x-ray noted for pneumonia -Was recently placed on Levaquin and steroids by PCP approximately 1 week ago -On admission, patient continued to have wheezing well into his hospital stay day 1.  Patient currently still has some wheezing however has improved. -Was placed on Solu-Medrol, along with nebulizer treatments, azithromycin and ceftriaxone -Patient did require oxygen upon admission however has been able to ambulate and maintain oxygen saturations in the high 90s on room air. -Will discharge patient with prednisone taper, azithromycin and Ceftin. -Given that patient had oxygen saturations in the 80s on admission, it was thought that he would require several days in the hospital. Patient actually improved quicker than expected.  Essential hypertension -Continue losartan  Since discharge the patient is tried to go back to work but has extreme difficulty in the heat with this.  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  10/05/2018 This patient's not been seen since the end of July he had failed several follow-up visits.  He did see James [name] end of August who did not make major changes in  his medication program.  He is to remain on [name] and Incruse.  The patient after being admitted in July got readmitted early August and then again in early September.  Discharge summaries are as noted below.  During the August admission the patient underwent bronchoscopy and had mucous plugging removed the culture showed normal  flora Note this patient has not been actively smoking recently Admit date: 08/09/2018 Discharge date: 08/11/2018  Discharge Diagnoses:  Acute on chronic hypoxic respiratory failure/sepsis due to multifocal pneumonia COPD Alcohol/marijuana use Essential hypertension  History of present illness:  on 08/09/2018 by Dr. [name] a 58 y.o.malewith medical history significant ofCOPD and ETOH dependence presenting with respiratory distress.He woke up about 530 this AM and couldn't breathe. This just keeps happening. He felt similarly when hospitalized last week. He has been doing fine until today. No h/o OSA, not on CPAP. Not on home O2. +cough, productive of yellowish sputum. It is sometimes hard to get the sputum up. No fever. +wheezing since his last hospitalization. Quit smoking in 2014. He lives in a non-air-conditioned log cabin.  He was hospitalized from 7/27-28 for acute on chronic respiratory failure associated with COPD exacerbation and PNA. This was his 4th admission this year. He was seen by James [name] on 7/30 with plan for completion of course of Keflex and prednisone, as well as prednisone taper.  Hospital Course:  Acute on chronic hypoxic respiratory failure/sepsis due to multifocal pneumonia -Was recently hospitalized in late July for pneumonia and COPD exacerbation. Was discharged with antibiotics as well as prednisone taper. Patient followed up with James [name] on 08/03/2018 with completion of Keflex and prednisone as well. -This is his fourth admission this year for similar presentation -Patient presented with cough and shortness of breath -Chest x-rayon admissionfindings consistent with right-sided multifocal pneumonia -CTA chest showed multifocal pneumonia on the right. COPD including emphysema. Negative for PE. -COVID negative -Upon admission, patient had leukocytosis, fever, tachycardia, tachypnea- all resolving  -Noted to be  hypoxic 88% on room air in the ED -Urine strep pneumoniaand legionellaantigensnegative -Sputum culture: Few GPC, GVR, rare yeast -Blood cultures show no growth -was placed on vancomycin, Zosyn, Solu-Medrol, supplemental oxygen -S/p bronchoscopy 8/6, pending culture results -CXR obtained after bronch on 8/6: almost complete clearing of the hazy infiltrates in the right lung -Discussed with Dr. Tamala Julian (pulm), recommended prednisone taper starting with prednisone 40mg  daily x 1 week, taper as an outpatient, along with Augmentin. He will follow up with the patient in one week. Given that infection cleared on xray so quickly, question if this is inflammatory vs infection.  -patient able to ambulate today without oxygen and maintained oxygen saturations in the mid 90s- therefore patient does not qualify for home oxygen -patient also had a swallow eval- mild aspiration risk  COPD -Continue nebulizer treatments, steroids, inhalers -Incentive spirometry, flutter valve  Alcohol/marijuana use -Discussed cessation -was placed on continue CIWA protocol  Essential hypertension -Continue Cozaar  Consultants PCCM- pulmonology   Procedures Bronchoscopy   Recommendations for Outpatient Follow-up:  1. Follow up with PCP in 1-2 weeks 2. Please obtain BMP/CBC in one week your next doctors visit.  3. Augmentin and azithromycin for 5 days 4. Advised to continue using bronchodilators every 4-6 hours for the next 4-5 days and then transition to as needed. 5. Advised to take his home prednisone, for next 3 days advised to increase it to 40 mg twice daily then resume back is 20 mg daily  Discharge Condition: Stable  CODE STATUS: Full code Diet recommendation: 2 g salt   DC summary from 09/11/18  Adm 9/7  D/C 09/13/18 Brief/Interim Summary: James [name] a 58 y.o.malewith history ofCOPD on prednisone, HTN, alcohol use disorder, marijuana use,tobacco user. Patient presented secondary to  dyspnea and found to have evidence of pneumonia and a COPD exacerbation in addition to sepsis physiology on admission.  There is concerns of possible right lower lobe pneumonia therefore started on Unasyn and azithromycin due to concerns of aspiration.  This was transitioned to Augmentin and azithromycin for total of 5 days at time of discharge.  Secondary to infection, he was having COPD exacerbation therefore on bronchodilators.  He started improving and on the day of discharge he was adamant about going home as he was feeling much better.  He was still having abnormal breath sounds and cough therefore I had requested him to stay another day in the hospital but still wanted to go home.  Instructions were given to him take 40 mg of prednisone for next 3 days and then he can resume back his 20 mg daily.  Advised... home bronchodilators periodically every 4-6 hours for next 4-5 days and then transition to as needed. Advised to follow-up with his PCP in about 1 week.   Discharge Diagnoses:  Active Problems:   Hypertension   COPD exacerbation ([place])   Marijuana abuse   Pneumonia   Sepsis ([place])  Acute respiratory distress secondary to right lower lobe pneumonia, community-acquired/aspiration Acute mild exacerbation of COPD Concern for possible aspiration.  Unasyn and azithromycin transition to Augmentin and azithromycin.  We will give him 5 more day course at the time of discharge 40 mg of prednisone for next 3 days then transition to 20 mg daily which is his home dose Advised to use bronchodilators.3  Alcohol use disorder Counseled to quit drinking alcohol.  Advised to continue thiamine, folate and multivitamin  Weakness -He did well with physical therapy.  Tobacco use Marijuana use Cessation discussed on admission  Consultations:  None  Subjective: Still having some cough and congestion but he is adamant about going home today as he is feeling much better than from admission.  I  recommended he stay in the hospital for at least 1 more day to help him feel even better.  He wished to go home. Ambulating in the hallway without additional saturating greater than 90% on room air.   Since discharge this patient went to urgent care on 21 September with an outbreak of shingles over his left lower back.  He is taken a course of valacyclovir and prednisone.  He has severe pain.  He was given gabapentin but this caused hallucinations and then we tried tramadol but this caused severe nausea and vomiting.  Apparently the patient cannot take Tylenol or nonsteroidal anti-inflammatories as well.  The patient states his breathing is been labored.  He still has productive cough of white mucus.  He has no fever.  He has significant back pain because of the shingles. The patient is maintaining the [facility] and Incruse as well as his antihypertensive medications and vitamins     10/12/2018  This patient is seen in return follow-up for acute shingles with postherpetic neuropathy, recent pneumonia, COPD exacerbation, and hypertension.  Patient is markedly improved from the last visit.  He has less cough less shortness of breath.  He has improved pain around the area of the shingles.  He is taking amitriptyline 25 mg at bedtime  The patient notes less  pain.  He does state he had been using the tramadol without nausea or vomiting.  I asked if patient would be interested in the para medicine program and expressed interest in the community para medicine program  11/23/2018 Patient seen in return follow-up and has noted increasing shortness of breath and we had to resume prednisone this patient.  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  He does maintain his inhalers.  He is drinking 4 beers a night.  He is not smoking.  Note the patient continues to live in a home that has standing water under the foundation and in several the rooms and mold on the carpet.  We have connected this patient with legal  aid to see if they can assist him in getting into better housing.  The patient's not had previous allergy testing   12/13/2018 The patient notes increased shortness of breath, wheezing and cough productive of thick yellow mucus.  He is using the flutter valve but is not being is productive with this.  He denies chest pain or fever.  He is down to 2 a day on the prednisone.  He is maintaining his inhalers. Apparently there is repair is being done on the patient's home and he is decided to stay in the home  12/21/2018 At last OV we Rx pulse pred and 5 d of levaquin      Shortness of Breath This is a chronic problem. The current episode started more than 1 year ago. The problem occurs daily (qhs shortness of breath). The problem has been rapidly worsening. Associated symptoms include chest pain, PND, a sore throat, sputum production and wheezing. Pertinent negatives include no claudication, fever, headaches, hemoptysis, leg pain, leg swelling, orthopnea, rash, syncope or vomiting. The symptoms are aggravated by any activity and weather changes. Associated symptoms comments: Pain radiates to arm Legs go to sleep No dysphagia No gerd . Risk factors include smoking. He has tried beta agonist inhalers, oral steroids and steroid inhalers for the symptoms. The treatment provided moderate relief. His past medical history is significant for COPD and pneumonia. There is no history of asthma, CAD, DVT, a heart failure or PE.    Past Medical History:  Diagnosis Date  . Acute on chronic respiratory failure with hypoxia ([place]) 06/08/2013  . CAP (community acquired pneumonia) 05/17/2018   See admit 05/17/18 ? rml  with   covid pcr neg - rx augmentin > f/u cxr in 4-6 weeks is fine unless condition declines   . COPD (chronic obstructive pulmonary disease) ([place])    not on home  . Dyspnea   . Hypertension   . Pneumonia 04/06/2016  . Pneumothorax    2016, fell from ladder  . 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 Tick bite 06/03/2018     Family History  Problem Relation Age of Onset  . Heart disease Father   . COPD Sister      Social History   Socioeconomic History  . Marital status: Divorced    Spouse name: Not on file  . Number of children: Not on file  . Years of education: Not on file  . Highest education level: Not on file  Occupational History  . Occupation: Dealer  . Occupation: Painter  Tobacco Use  . Smoking status: Former Smoker    Packs/day: 1.50    Years: 40.00    Pack years: 60.00    Types: Cigarettes    Quit date: 2014    Years since quitting: 6.9  . Smokeless  tobacco: Current User    Types: Chew  Substance and Sexual Activity  . Alcohol use: Yes    Alcohol/week: 28.0 standard drinks    Types: 28 Cans of beer per week    Comment: 4 beers a night; + jitters with not drinking, denies DTs or seizures  . Drug use: Yes    Types: Marijuana    Comment: daily; quit using crack and methamphetamine about 5 months ago   . Sexual activity: Not Currently    Partners: Female  Other Topics Concern  . Not on file  Social History Narrative   Lives alone near [place]   Social Determinants of Health   Financial Resource Strain: High Risk  . Difficulty of Paying Living Expenses: Hard  Food Insecurity: Unknown  . Worried About Charity fundraiser in the Last Year: Not on file  . Ran Out of Food in the Last Year: Never true  Transportation Needs: No Transportation Needs  . Lack of Transportation (Medical): No  . Lack of Transportation (Non-Medical): No  Physical Activity: Inactive  . Days of Exercise per Week: 0 days  . Minutes of Exercise per Session: 0 min  Stress: Stress Concern Present  . Feeling of Stress : To some extent  Social Connections: Slightly Isolated  . Frequency of Communication with Friends and Family: Three times a week  . Frequency of Social Gatherings with Friends and Family: Once a week  . Attends Religious Services: 1 to 4 times  per year  . Active Member of Clubs or Organizations: Yes  . Attends Archivist Meetings: 1 to 4 times per year  . Marital Status: Divorced  Human resources officer Violence:   . Fear of Current or Ex-Partner: Not on file  . Emotionally Abused: Not on file  . Physically Abused: Not on file  . Sexually Abused: Not on file     Allergies  Allergen Reactions  . Gabapentin Other (See Comments)    hallucinations     Outpatient Medications Prior to Visit  Medication Sig Dispense Refill  . albuterol (PROVENTIL HFA;VENTOLIN HFA) 108 (90 Base) MCG/ACT inhaler Inhale 1-2 puffs into the lungs every 6 (six) hours as needed for wheezing or shortness of breath. 1 Inhaler 5  . albuterol (PROVENTIL) (2.5 MG/3ML) 0.083% nebulizer solution Take 3 mLs (2.5 mg total) by nebulization every 6 (six) hours as needed for wheezing or shortness of breath. 604 mL 1  . folic acid (FOLVITE) 1 MG tablet Take 1 tablet (1 mg total) by mouth daily. 90 tablet 1  . levofloxacin (LEVAQUIN) 500 MG tablet Take 1 tablet (500 mg total) by mouth daily. 5 tablet 0  . losartan (COZAAR) 100 MG tablet TAKE 1 TABLET (100 MG TOTAL) BY MOUTH DAILY. 30 tablet 2  . mometasone-formoterol (DULERA) 200-5 MCG/ACT AERO Inhale 2 puffs into the lungs 2 (two) times daily. 13 g 2  . predniSONE (DELTASONE) 10 MG tablet Take 4 tablets daily for 5 days and then two a day and STAY 60 tablet 1  . Respiratory Therapy Supplies (FLUTTER) DEVI Use 4 times daily 1 each 0  . Spacer/Aero-Holding Chambers (AEROCHAMBER MV) inhaler Use as instructed 1 each 0  . thiamine (VITAMIN B-1) 100 MG tablet Take 1 tablet (100 mg total) by mouth daily. 30 tablet 3  . umeclidinium bromide (INCRUSE ELLIPTA) 62.5 MCG/INH AEPB Inhale 1 puff into the lungs daily. 30 each 5   No facility-administered medications prior to visit.     Review of Systems  Constitutional: Negative for fever.  HENT: Positive for sore throat. Negative for sinus pressure, sinus pain and trouble  swallowing.   Eyes: Negative.   Respiratory: Positive for cough, sputum production, chest tightness, shortness of breath and wheezing. Negative for hemoptysis.   Cardiovascular: Positive for chest pain and PND. Negative for palpitations, orthopnea, claudication, leg swelling and syncope.  Gastrointestinal: Negative for nausea and vomiting.  Genitourinary: Negative.   Musculoskeletal: Positive for back pain.  Skin: Negative for rash.  Neurological: Negative for headaches.  Psychiatric/Behavioral: Negative for hallucinations. The patient is not nervous/anxious and is not hyperactive.        Objective:   Physical Exam There were no vitals filed for this visit.  Gen: , well-nourished, in no distress,   ENT: No lesions,  mouth clear,  oropharynx clear, no postnasal drip  Neck: No JVD, no TMG, no carotid bruits  Lungs: No use of accessory muscles, no dullness to percussion, inspiratory and expiratory wheeze with poor airflow  Cardiovascular: RRR, heart sounds normal, no murmur or gallops, no peripheral edema  Abdomen: soft and NT, no HSM,  BS normal  Musculoskeletal: No deformities, no cyanosis or clubbing  Neuro: alert, non focal  Skin: Warm, no rash Media Information    Document Information  Photos    11/02/2018 14:43  Attached To:  Morrow, MD  [facility]     BMP Latest Ref Rng & Units 09/12/2018 09/11/2018 08/11/2018  Glucose 70 - 99 mg/dL 121(H) 139(H) 195(H)  BUN 6 - 20 mg/dL 18 18 16   Creatinine 0.61 - 1.24 mg/dL 0.61 0.87 0.86  BUN/Creat Ratio 9 - 20 - - -  Sodium 135 - 145 mmol/L 138 140 138  Potassium 3.5 - 5.1 mmol/L 4.3 4.3 4.2  Chloride 98 - 111 mmol/L 106 103 102  CO2 22 - 32 mmol/L 24 27 27   Calcium 8.9 - 10.3 mg/dL 8.4(L) 9.0 8.7(L)   Hepatic Function Panel     Component Value Date/Time   PROT 6.0 06/20/2018 1159   ALBUMIN 4.1 06/20/2018 1159   AST 34 06/20/2018 1159   ALT 37 06/20/2018  1159   ALT 62 (H) 06/23/2016 1226   ALKPHOS 43 06/20/2018 1159   BILITOT 0.4 06/20/2018 1159   BILIDIR 0.12 04/16/2016 1601   IBILI NOT CALCULATED 03/26/2016 1845   CBC Latest Ref Rng & Units 09/13/2018 09/12/2018 09/11/2018  WBC 4.0 - 10.5 K/uL 20.2(H) 19.6(H) 26.1(H)  Hemoglobin 13.0 - 17.0 g/dL 13.9 13.8 14.7  Hematocrit 39.0 - 52.0 % 42.5 43.1 44.7  Platelets 150 - 400 K/uL 240 225 239   Chest x-ray reviewed today shows no active process no pneumonia     Assessment & Plan:  I personally reviewed all images and lab data in the [facility] system as well as any outside material available during this office visit and agree with the  radiology impressions.   No problem-specific Assessment & Plan notes found for this encounter.   There are no diagnoses linked to this encounter. Return 1 week for recheck

## 2018-12-20 ENCOUNTER — Telehealth: Payer: Self-pay

## 2018-12-20 ENCOUNTER — Other Ambulatory Visit (HOSPITAL_COMMUNITY): Payer: Self-pay

## 2018-12-20 MED FILL — ?PREDNISONE 10 MG TABLET: 10 | 25 days supply | Qty: 60 | Fill #0

## 2018-12-20 NOTE — Telephone Encounter (Signed)
Called patient to do their pre-visit COVID screening.  Call went to voicemail. Unable to do prescreening.  

## 2018-12-20 NOTE — Progress Notes (Signed)
Paramedicine Encounter    Patient ID: James [name], male    DOB: 06-21-1960, 58 y.o.   MRN: [id number]   Patient Care Team: [name], MD as PCP - General (Pulmonary Disease) O'Neal, Cassie Freer, MD as PCP - Cardiology (Cardiology)  Patient Active Problem List   Diagnosis Date Noted  . 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 Marijuana abuse 08/09/2018  . COPD exacerbation ([place]) 02/16/2018  . Hypertension 05/17/2016  . Low back pain radiating to both legs 04/18/2016  . History of tobacco abuse 04/06/2016  . Alcohol abuse 04/06/2016  . COPD mixed type ([place]) 03/26/2016    Current Outpatient Medications:  .  albuterol (PROVENTIL HFA;VENTOLIN HFA) 108 (90 Base) MCG/ACT inhaler, Inhale 1-2 puffs into the lungs every 6 (six) hours as needed for wheezing or shortness of breath., Disp: 1 Inhaler, Rfl: 5 .  albuterol (PROVENTIL) (2.5 MG/3ML) 0.083% nebulizer solution, Take 3 mLs (2.5 mg total) by nebulization every 6 (six) hours as needed for wheezing or shortness of breath., Disp: 150 mL, Rfl: 1 .  folic acid (FOLVITE) 1 MG tablet, Take 1 tablet (1 mg total) by mouth daily., Disp: 90 tablet, Rfl: 1 .  losartan (COZAAR) 100 MG tablet, TAKE 1 TABLET (100 MG TOTAL) BY MOUTH DAILY., Disp: 30 tablet, Rfl: 2 .  mometasone-formoterol (DULERA) 200-5 MCG/ACT AERO, Inhale 2 puffs into the lungs 2 (two) times daily., Disp: 13 g, Rfl: 2 .  predniSONE (DELTASONE) 10 MG tablet, Take 4 tablets daily for 5 days and then two a day and STAY, Disp: 60 tablet, Rfl: 1 .  Respiratory Therapy Supplies (FLUTTER) DEVI, Use 4 times daily, Disp: 1 each, Rfl: 0 .  Spacer/Aero-Holding Chambers (AEROCHAMBER MV) inhaler, Use as instructed, Disp: 1 each, Rfl: 0 .  thiamine (VITAMIN B-1) 100 MG tablet, Take 1 tablet (100 mg total) by mouth daily., Disp: 30 tablet, Rfl: 3 .  umeclidinium bromide (INCRUSE ELLIPTA) 62.5 MCG/INH AEPB, Inhale 1 puff into the lungs daily., Disp: 30 each, Rfl: 5 .  levofloxacin (LEVAQUIN) 500 MG  tablet, Take 1 tablet (500 mg total) by mouth daily. (Patient not taking: Reported on 12/20/2018), Disp: 5 tablet, Rfl: 0 Allergies  Allergen Reactions  . Gabapentin Other (See Comments)    hallucinations      Social History   Socioeconomic History  . Marital status: Divorced    Spouse name: Not on file  . Number of children: Not on file  . Years of education: Not on file  . Highest education level: Not on file  Occupational History  . Occupation: Dealer  . Occupation: Painter  Tobacco Use  . Smoking status: Former Smoker    Packs/day: 1.50    Years: 40.00    Pack years: 60.00    Types: Cigarettes    Quit date: 2014    Years since quitting: 6.9  . Smokeless tobacco: Current User    Types: Chew  Substance and Sexual Activity  . Alcohol use: Yes    Alcohol/week: 28.0 standard drinks    Types: 28 Cans of beer per week    Comment: 4 beers a night; + jitters with not drinking, denies DTs or seizures  . Drug use: Yes    Types: Marijuana    Comment: daily; quit using crack and methamphetamine about 5 months ago   . Sexual activity: Not Currently    Partners: Female  Other Topics Concern  . Not on file  Social History Narrative   Lives alone near [place]  Social Determinants of Health   Financial Resource Strain: High Risk  . Difficulty of Paying Living Expenses: Hard  Food Insecurity: Unknown  . Worried About Charity fundraiser in the Last Year: Not on file  . Ran Out of Food in the Last Year: Never true  Transportation Needs: No Transportation Needs  . Lack of Transportation (Medical): No  . Lack of Transportation (Non-Medical): No  Physical Activity: Inactive  . Days of Exercise per Week: 0 days  . Minutes of Exercise per Session: 0 min  Stress: Stress Concern Present  . Feeling of Stress : To some extent  Social Connections: Slightly Isolated  . Frequency of Communication with Friends and Family: Three times a week  . Frequency of Social Gatherings  with Friends and Family: Once a week  . Attends Religious Services: 1 to 4 times per year  . Active Member of Clubs or Organizations: Yes  . Attends Archivist Meetings: 1 to 4 times per year  . Marital Status: Divorced  Human resources officer Violence:   . Fear of Current or Ex-Partner: Not on file  . Emotionally Abused: Not on file  . Physically Abused: Not on file  . Sexually Abused: Not on file    Physical Exam      Future Appointments  Date Time Provider [place]  12/21/2018  1:30 PM PCE-COVERING PROVIDER PCE-PCE None    BP (!) 170/88   Pulse 90   Resp 18   SpO2 97%    Pt reports doing ok. Breathing staying the same--up and down. He did not pick up the antibiotic yet-he said his sister had that and he needs to go get it from her. He just picked up the prednisone today.  He reports taking all his other meds.  He is going to PCP for another f/u tomor.  Lungs sounds are clear today.  He is using his wood stove now.  His "landlord" is starting to work on covering up the lack of floor this week.  He denies c/p, sob upon exertion.  He denies dizziness.    [name], [facility] Paramedic  12/20/18

## 2018-12-21 ENCOUNTER — Ambulatory Visit: Payer: Self-pay

## 2019-01-03 ENCOUNTER — Other Ambulatory Visit (HOSPITAL_COMMUNITY): Payer: Self-pay

## 2019-01-03 MED FILL — ?FOLIC ACID 1MG TAB: 1 | 30 days supply | Qty: 30 | Fill #3

## 2019-01-03 MED FILL — predniSONE 10 MG TABS: 10 | 25 days supply | Qty: 60 | Fill #1

## 2019-01-03 MED FILL — ALBUTEROL SULFATE HFA 108 (: 108 (90 BAS | 25 days supply | Qty: 18 | Fill #4

## 2019-01-03 MED FILL — INCRUSE ELLIPTA 62.5 MCG IN: 62.5 | 30 days supply | Qty: 30 | Fill #4

## 2019-01-03 NOTE — Progress Notes (Signed)
Paramedicine Encounter    Patient ID: James [name], male    DOB: 1960-01-30, 58 y.o.   MRN: [id number]   Patient Care Team: [name], MD as PCP - General (Pulmonary Disease) O'Neal, Cassie Freer, MD as PCP - Cardiology (Cardiology)  Patient Active Problem List   Diagnosis Date Noted  . 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 Marijuana abuse 08/09/2018  . COPD exacerbation ([place]) 02/16/2018  . Hypertension 05/17/2016  . Low back pain radiating to both legs 04/18/2016  . History of tobacco abuse 04/06/2016  . Alcohol abuse 04/06/2016  . COPD mixed type ([place]) 03/26/2016    Current Outpatient Medications:  .  albuterol (PROVENTIL HFA;VENTOLIN HFA) 108 (90 Base) MCG/ACT inhaler, Inhale 1-2 puffs into the lungs every 6 (six) hours as needed for wheezing or shortness of breath., Disp: 1 Inhaler, Rfl: 5 .  albuterol (PROVENTIL) (2.5 MG/3ML) 0.083% nebulizer solution, Take 3 mLs (2.5 mg total) by nebulization every 6 (six) hours as needed for wheezing or shortness of breath., Disp: 150 mL, Rfl: 1 .  folic acid (FOLVITE) 1 MG tablet, Take 1 tablet (1 mg total) by mouth daily., Disp: 90 tablet, Rfl: 1 .  losartan (COZAAR) 100 MG tablet, TAKE 1 TABLET (100 MG TOTAL) BY MOUTH DAILY., Disp: 30 tablet, Rfl: 2 .  mometasone-formoterol (DULERA) 200-5 MCG/ACT AERO, Inhale 2 puffs into the lungs 2 (two) times daily., Disp: 13 g, Rfl: 2 .  predniSONE (DELTASONE) 10 MG tablet, Take 4 tablets daily for 5 days and then two a day and STAY, Disp: 60 tablet, Rfl: 1 .  Respiratory Therapy Supplies (FLUTTER) DEVI, Use 4 times daily, Disp: 1 each, Rfl: 0 .  Spacer/Aero-Holding Chambers (AEROCHAMBER MV) inhaler, Use as instructed, Disp: 1 each, Rfl: 0 .  thiamine (VITAMIN B-1) 100 MG tablet, Take 1 tablet (100 mg total) by mouth daily., Disp: 30 tablet, Rfl: 3 .  umeclidinium bromide (INCRUSE ELLIPTA) 62.5 MCG/INH AEPB, Inhale 1 puff into the lungs daily., Disp: 30 each, Rfl: 5 .  levofloxacin (LEVAQUIN) 500 MG  tablet, Take 1 tablet (500 mg total) by mouth daily. (Patient not taking: Reported on 12/20/2018), Disp: 5 tablet, Rfl: 0 Allergies  Allergen Reactions  . Gabapentin Other (See Comments)    hallucinations      Social History   Socioeconomic History  . Marital status: Divorced    Spouse name: Not on file  . Number of children: Not on file  . Years of education: Not on file  . Highest education level: Not on file  Occupational History  . Occupation: Dealer  . Occupation: Painter  Tobacco Use  . Smoking status: Former Smoker    Packs/day: 1.50    Years: 40.00    Pack years: 60.00    Types: Cigarettes    Quit date: 2014    Years since quitting: 7.0  . Smokeless tobacco: Current User    Types: Chew  Substance and Sexual Activity  . Alcohol use: Yes    Alcohol/week: 28.0 standard drinks    Types: 28 Cans of beer per week    Comment: 4 beers a night; + jitters with not drinking, denies DTs or seizures  . Drug use: Yes    Types: Marijuana    Comment: daily; quit using crack and methamphetamine about 5 months ago   . Sexual activity: Not Currently    Partners: Female  Other Topics Concern  . Not on file  Social History Narrative   Lives alone near [place]  Social Determinants of Health   Financial Resource Strain: High Risk  . Difficulty of Paying Living Expenses: Hard  Food Insecurity: Unknown  . Worried About Charity fundraiser in the Last Year: Not on file  . Ran Out of Food in the Last Year: Never true  Transportation Needs: No Transportation Needs  . Lack of Transportation (Medical): No  . Lack of Transportation (Non-Medical): No  Physical Activity: Inactive  . Days of Exercise per Week: 0 days  . Minutes of Exercise per Session: 0 min  Stress: Stress Concern Present  . Feeling of Stress : To some extent  Social Connections: Slightly Isolated  . Frequency of Communication with Friends and Family: Three times a week  . Frequency of Social Gatherings  with Friends and Family: Once a week  . Attends Religious Services: 1 to 4 times per year  . Active Member of Clubs or Organizations: Yes  . Attends Archivist Meetings: 1 to 4 times per year  . Marital Status: Divorced  Human resources officer Violence:   . Fear of Current or Ex-Partner: Not on file  . Emotionally Abused: Not on file  . Physically Abused: Not on file  . Sexually Abused: Not on file    Physical Exam      No future appointments.  BP (!) 150/76   Pulse 98   Temp 97.7 F (36.5 C)   Resp 18   SpO2 98%   Pt reports doing the same. Breathing doing the same.  He has all his meds. Tremors are down.  Lungs sound ok-he has a small wheeze to his left lower lobe, everywhere else clear.  House still the same. Nothing else has been done to the house. Still using wood stove.   [name], [facility] Paramedic  01/04/19

## 2019-01-11 ENCOUNTER — Other Ambulatory Visit: Payer: Self-pay | Admitting: Critical Care Medicine

## 2019-01-12 MED FILL — LOSARTAN POTASSIUM 100 MG T: 100 | 30 days supply | Qty: 30 | Fill #0

## 2019-01-16 ENCOUNTER — Other Ambulatory Visit: Payer: Self-pay

## 2019-01-16 ENCOUNTER — Encounter (HOSPITAL_COMMUNITY): Payer: Self-pay

## 2019-01-16 ENCOUNTER — Ambulatory Visit (HOSPITAL_COMMUNITY)
Admission: EM | Admit: 2019-01-16 | Discharge: 2019-01-16 | Disposition: A | Discharge status: Home / Self Care | Payer: Self-pay | Attending: Urgent Care | Admitting: Urgent Care

## 2019-01-16 DIAGNOSIS — J449 Chronic obstructive pulmonary disease, unspecified: Secondary | ICD-10-CM

## 2019-01-16 DIAGNOSIS — F1290 Cannabis use, unspecified, uncomplicated: Secondary | ICD-10-CM

## 2019-01-16 DIAGNOSIS — M79601 Pain in right arm: Secondary | ICD-10-CM

## 2019-01-16 DIAGNOSIS — S40022A Contusion of left upper arm, initial encounter: Secondary | ICD-10-CM

## 2019-01-16 DIAGNOSIS — F1011 Alcohol abuse, in remission: Secondary | ICD-10-CM

## 2019-01-16 MED ORDER — TRAMADOL HCL 50 MG PO TABS: 50.0000 mg | ORAL_TABLET | Freq: Three times a day (TID) | ORAL | 0 refills | Status: DC | PRN

## 2019-01-16 NOTE — ED Triage Notes (Signed)
Pt states he was loading a log on the back of his truck. Pt states his right arm is black and blue. Pt states his arm is in serve pain. This happened yesterday.

## 2019-01-16 NOTE — ED Provider Notes (Signed)
[place]   MRN: [id number] DOB: 1960/03/11  Subjective:   James [name] is a 59 y.o. male presenting for 1 day history of progressively worsening bruising of his right arm and associated pain.  Patient states that he was lifting a very heavy log (had to wrap around both arms to hold the log) yesterday onto his truck and had a very difficult time with this.  He admits that it rolled wrong off of his arm onto the truck.  Has not taken anything for pain.  He has a longstanding history of COPD, has regular visits with his pulmonologist, once monthly.  No current facility-administered medications for this encounter.  Current Outpatient Medications:  .  albuterol (PROVENTIL HFA;VENTOLIN HFA) 108 (90 Base) MCG/ACT inhaler, Inhale 1-2 puffs into the lungs every 6 (six) hours as needed for wheezing or shortness of breath., Disp: 1 Inhaler, Rfl: 5 .  albuterol (PROVENTIL) (2.5 MG/3ML) 0.083% nebulizer solution, Take 3 mLs (2.5 mg total) by nebulization every 6 (six) hours as needed for wheezing or shortness of breath., Disp: 150 mL, Rfl: 1 .  folic acid (FOLVITE) 1 MG tablet, Take 1 tablet (1 mg total) by mouth daily., Disp: 90 tablet, Rfl: 1 .  levofloxacin (LEVAQUIN) 500 MG tablet, Take 1 tablet (500 mg total) by mouth daily. (Patient not taking: Reported on 12/20/2018), Disp: 5 tablet, Rfl: 0 .  losartan (COZAAR) 100 MG tablet, TAKE 1 TABLET (100 MG TOTAL) BY MOUTH DAILY., Disp: 30 tablet, Rfl: 2 .  mometasone-formoterol (DULERA) 200-5 MCG/ACT AERO, Inhale 2 puffs into the lungs 2 (two) times daily., Disp: 13 g, Rfl: 2 .  predniSONE (DELTASONE) 10 MG tablet, Take 4 tablets daily for 5 days and then two a day and STAY, Disp: 60 tablet, Rfl: 1 .  Respiratory Therapy Supplies (FLUTTER) DEVI, Use 4 times daily, Disp: 1 each, Rfl: 0 .  Spacer/Aero-Holding Chambers (AEROCHAMBER MV) inhaler, Use as instructed, Disp: 1 each, Rfl: 0 .  thiamine (VITAMIN B-1) 100 MG tablet, Take 1 tablet (100 mg  total) by mouth daily., Disp: 30 tablet, Rfl: 3 .  umeclidinium bromide (INCRUSE ELLIPTA) 62.5 MCG/INH AEPB, Inhale 1 puff into the lungs daily., Disp: 30 each, Rfl: 5   Allergies  Allergen Reactions  . Gabapentin Other (See Comments)    hallucinations    Past Medical History:  Diagnosis Date  . Acute on chronic respiratory failure with hypoxia ([place]) 06/08/2013  . CAP (community acquired pneumonia) 05/17/2018   See admit 05/17/18 ? rml  with   covid pcr neg - rx augmentin > f/u cxr in 4-6 weeks is fine unless condition declines   . COPD (chronic obstructive pulmonary disease) ([place])    not on home  . Dyspnea   . Hypertension   . Pneumonia 04/06/2016  . Pneumothorax    2016, fell from ladder  . 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 Tick bite 06/03/2018     Past Surgical History:  Procedure Laterality Date  . VIDEO ASSISTED THORACOSCOPY (VATS)/THOROCOTOMY Left 06/11/2013   Procedure: VIDEO ASSISTED THORACOSCOPY (VATS)/THOROCOTOMY;  Surgeon: Ivin Poot, MD;  Location: [facility];  Service: Thoracic;  Laterality: Left;  [name] VIDEO BRONCHOSCOPY N/A 06/11/2013   Procedure: VIDEO BRONCHOSCOPY;  Surgeon: Ivin Poot, MD;  Location: [facility];  Service: Thoracic;  Laterality: N/A;  . VIDEO BRONCHOSCOPY N/A 08/10/2018   Procedure: VIDEO BRONCHOSCOPY WITH FLUORO;  Surgeon: Candee Furbish, MD;  [facility] ENDOSCOPY;  Service: Endoscopy;  Laterality: N/A;  Family History  Problem Relation Age of Onset  . Heart disease Father   . COPD Sister     Social History   Tobacco Use  . Smoking status: Former Smoker    Packs/day: 1.50    Years: 40.00    Pack years: 60.00    Types: Cigarettes    Quit date: 2014    Years since quitting: 7.0  . Smokeless tobacco: Current User    Types: Chew  Substance Use Topics  . Alcohol use: Yes    Alcohol/week: 28.0 standard drinks    Types: 28 Cans of beer per week    Comment: 4 beers a night; + jitters with not drinking, denies DTs or seizures  . Drug  use: Yes    Types: Marijuana    Comment: daily; quit using crack and methamphetamine about 5 months ago     ROS   Objective:   Vitals: BP 124/62 (BP Location: Right Arm)   Pulse 82   Temp 98.2 F (36.8 C) (Oral)   Resp 18   Wt 210 lb (95.3 kg)   SpO2 100%   BMI 30.57 kg/m   Physical Exam Constitutional:      General: He is not in acute distress.    Appearance: Normal appearance. He is well-developed. He is not ill-appearing, toxic-appearing or diaphoretic.  HENT:     Head: Normocephalic and atraumatic.     Right Ear: External ear normal.     Left Ear: External ear normal.     Nose: Nose normal.     Mouth/Throat:     Mouth: Mucous membranes are moist.     Pharynx: Oropharynx is clear.  Eyes:     General: No scleral icterus.    Extraocular Movements: Extraocular movements intact.     Pupils: Pupils are equal, round, and reactive to light.  Cardiovascular:     Rate and Rhythm: Normal rate and regular rhythm.     Heart sounds: Normal heart sounds. No murmur. No friction rub. No gallop.   Pulmonary:     Effort: Pulmonary effort is normal. No respiratory distress.     Breath sounds: No stridor. No wheezing, rhonchi or rales.  Musculoskeletal:       Arms:  Neurological:     Mental Status: He is alert and oriented to person, place, and time.  Psychiatric:        Mood and Affect: Mood normal.        Behavior: Behavior normal.        Thought Content: Thought content normal.            Assessment and Plan :   1. Traumatic ecchymosis of left upper arm, initial encounter   2. Right arm pain   3. Chronic obstructive pulmonary disease, unspecified COPD type ([place])   4. History of alcohol abuse   5. Marijuana use     Will use conservative management with APAP, tramadol for breakthrough pain.  Recommended rest, monitor for symptoms warranting ER visit including chest pain, worsening breathing, loss of sensation in the hand, worsening swelling.  Patient is to  maintain close follow-up with his pulmonologist and PCP.  [place] database was reviewed and due to his medical history of marijuana and alcohol abuse is high risk.  Therefore it limited his quantity for tramadol.   Jaynee Eagles, PA-C 01/16/19 1311

## 2019-01-16 NOTE — Discharge Instructions (Addendum)
Schedule Tylenol at a dose of 500mg  - 650mg  once every 6 hours for pain.  If you have breakthrough pain, this is pain that is not helped by Tylenol, then you can use tramadol once every 8 hours for severe pain.  Once your pain is better controlled think switch back to Tylenol.  If you develop chest pain or more shortness of breath outside of what is normal for your COPD, then please report to the emergency room.

## 2019-01-17 ENCOUNTER — Other Ambulatory Visit (HOSPITAL_COMMUNITY): Payer: Self-pay
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
## 2019-01-18 MED FILL — traMADol HCL 50 MG TABS: 50 | 3 days supply | Qty: 10 | Fill #0

## 2019-01-22 ENCOUNTER — Other Ambulatory Visit: Payer: Self-pay | Admitting: Critical Care Medicine

## 2019-01-22 MED FILL — predniSONE 10 MG TABS: 10 | 25 days supply | Qty: 60 | Fill #0

## 2019-01-31 ENCOUNTER — Telehealth: Payer: Self-pay

## 2019-01-31 NOTE — Telephone Encounter (Addendum)
Call received from [name], EMT explaining that the landlord is still attempting to make the needed repairs/renovations to the patient's residence but there has not been any real progress.   The disability application has been submitted and the decision is still pending. The patient holds his mail that he receives for [name] to review.  No change in his medical status.  No concerns about his medication management at this time.  She also noted that the patient's sister, who has been providing assistance with navigating his medical and housing/financial needs is now ill and may not be able to provide the support that she has been able to offer.     Per Mickel Baas Marsh/[employer] [place], they are not able to provide any assistance at this time. The patient would need to contact them on his own behalf and they can discuss options about his housing.  His has the  option is to contact the housing Agricultural consultant.  This option had been discussed with [name].EMT and the understanding is that contacting the housing inspector would be very traumatic for the patient and he would not want to leave his residence despite the circumstances.

## 2019-02-01 NOTE — Telephone Encounter (Signed)
I understand and thank you for the update Opal Sidles

## 2019-02-07 ENCOUNTER — Other Ambulatory Visit (HOSPITAL_COMMUNITY): Payer: Self-pay

## 2019-02-07 NOTE — Progress Notes (Signed)
Paramedicine Encounter    Patient ID: James [name], male    DOB: February 18, 1960, 59 y.o.   MRN: [id number]   Patient Care Team: [name], MD as PCP - General (Pulmonary Disease) O'Neal, Cassie Freer, MD as PCP - Cardiology (Cardiology)  Patient Active Problem List   Diagnosis Date Noted  . 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 Marijuana abuse 08/09/2018  . COPD exacerbation ([place]) 02/16/2018  . Hypertension 05/17/2016  . Low back pain radiating to both legs 04/18/2016  . History of tobacco abuse 04/06/2016  . Alcohol abuse 04/06/2016  . COPD mixed type ([place]) 03/26/2016    Current Outpatient Medications:  .  albuterol (PROVENTIL HFA;VENTOLIN HFA) 108 (90 Base) MCG/ACT inhaler, Inhale 1-2 puffs into the lungs every 6 (six) hours as needed for wheezing or shortness of breath., Disp: 1 Inhaler, Rfl: 5 .  albuterol (PROVENTIL) (2.5 MG/3ML) 0.083% nebulizer solution, Take 3 mLs (2.5 mg total) by nebulization every 6 (six) hours as needed for wheezing or shortness of breath., Disp: 150 mL, Rfl: 1 .  folic acid (FOLVITE) 1 MG tablet, Take 1 tablet (1 mg total) by mouth daily., Disp: 90 tablet, Rfl: 1 .  losartan (COZAAR) 100 MG tablet, TAKE 1 TABLET (100 MG TOTAL) BY MOUTH DAILY., Disp: 30 tablet, Rfl: 2 .  mometasone-formoterol (DULERA) 200-5 MCG/ACT AERO, Inhale 2 puffs into the lungs 2 (two) times daily., Disp: 13 g, Rfl: 2 .  predniSONE (DELTASONE) 10 MG tablet, TAKE 4 TABLETS DAILY FOR 5 DAYS AND THEN TAKE 2 TABLETS DAILY AND STAY., Disp: 60 tablet, Rfl: 1 .  Respiratory Therapy Supplies (FLUTTER) DEVI, Use 4 times daily, Disp: 1 each, Rfl: 0 .  Spacer/Aero-Holding Chambers (AEROCHAMBER MV) inhaler, Use as instructed, Disp: 1 each, Rfl: 0 .  thiamine (VITAMIN B-1) 100 MG tablet, Take 1 tablet (100 mg total) by mouth daily., Disp: 30 tablet, Rfl: 3 .  traMADol (ULTRAM) 50 MG tablet, Take 1 tablet (50 mg total) by mouth every 8 (eight) hours as needed., Disp: 10 tablet, Rfl: 0 .   umeclidinium bromide (INCRUSE ELLIPTA) 62.5 MCG/INH AEPB, Inhale 1 puff into the lungs daily., Disp: 30 each, Rfl: 5 .  levofloxacin (LEVAQUIN) 500 MG tablet, Take 1 tablet (500 mg total) by mouth daily. (Patient not taking: Reported on 12/20/2018), Disp: 5 tablet, Rfl: 0 Allergies  Allergen Reactions  . Gabapentin Other (See Comments)    hallucinations      Social History   Socioeconomic History  . Marital status: Divorced    Spouse name: Not on file  . Number of children: Not on file  . Years of education: Not on file  . Highest education level: Not on file  Occupational History  . Occupation: Dealer  . Occupation: Painter  Tobacco Use  . Smoking status: Former Smoker    Packs/day: 1.50    Years: 40.00    Pack years: 60.00    Types: Cigarettes    Quit date: 2014    Years since quitting: 7.0  . Smokeless tobacco: Current User    Types: Chew  Substance and Sexual Activity  . Alcohol use: Yes    Alcohol/week: 28.0 standard drinks    Types: 28 Cans of beer per week    Comment: 4 beers a night; + jitters with not drinking, denies DTs or seizures  . Drug use: Yes    Types: Marijuana    Comment: daily; quit using crack and methamphetamine about 5 months ago   . Sexual  activity: Not Currently    Partners: Female  Other Topics Concern  . Not on file  Social History Narrative   Lives alone near [place]   Social Determinants of Health   Financial Resource Strain: High Risk  . Difficulty of Paying Living Expenses: Hard  Food Insecurity: Unknown  . Worried About Charity fundraiser in the Last Year: Not on file  . Ran Out of Food in the Last Year: Never true  Transportation Needs: No Transportation Needs  . Lack of Transportation (Medical): No  . Lack of Transportation (Non-Medical): No  Physical Activity: Inactive  . Days of Exercise per Week: 0 days  . Minutes of Exercise per Session: 0 min  Stress: Stress Concern Present  . Feeling of Stress : To some extent   Social Connections: Slightly Isolated  . Frequency of Communication with Friends and Family: Three times a week  . Frequency of Social Gatherings with Friends and Family: Once a week  . Attends Religious Services: 1 to 4 times per year  . Active Member of Clubs or Organizations: Yes  . Attends Archivist Meetings: 1 to 4 times per year  . Marital Status: Divorced  Human resources officer Violence:   . Fear of Current or Ex-Partner: Not on file  . Emotionally Abused: Not on file  . Physically Abused: Not on file  . Sexually Abused: Not on file    Physical Exam      No future appointments.  BP (!) 152/82   Pulse 94   Temp 97.7 F (36.5 C)   Resp 18   SpO2 97%   Pt reports he is doing about the same, he is running low on some of his meds and he will get his sister to call them in and get them p/u.  He denies increased sob, no cough/fever,  lungs are clear today. No dizziness, no c/p, no h/a.  Still no progress on the house. He reports they are not trying to find someone else to complete the work.  He said someone from [facility] office called his sister and said he should be getting his SSI soon, not sure who that was. Will f/u in a few wks.   [name], [facility] Paramedic  02/07/19

## 2019-02-14 MED FILL — FOLIC ACID 1 MG TABS: 1 | 30 days supply | Qty: 30 | Fill #4

## 2019-02-14 MED FILL — predniSONE 10 MG TABS: 10 | 25 days supply | Qty: 60 | Fill #1

## 2019-02-14 MED FILL — ALBUTEROL SULFATE HFA 108 (: 108 (90 BAS | 25 days supply | Qty: 18 | Fill #5

## 2019-02-14 MED FILL — LOSARTAN POTASSIUM 100 MG T: 100 | 30 days supply | Qty: 30 | Fill #1

## 2019-02-14 MED FILL — !INCRUSE ELLIPTA 62.5 MCG I: 62.5 | 25 days supply | Qty: 30 | Fill #5

## 2019-02-19 ENCOUNTER — Ambulatory Visit (HOSPITAL_COMMUNITY)
Admission: EM | Admit: 2019-02-19 | Discharge: 2019-02-19 | Disposition: A | Discharge status: Home / Self Care | Payer: Medicaid Other | Attending: Family Medicine | Admitting: Family Medicine

## 2019-02-19 ENCOUNTER — Encounter (HOSPITAL_COMMUNITY): Payer: Self-pay

## 2019-02-19 ENCOUNTER — Other Ambulatory Visit: Payer: Self-pay

## 2019-02-19 ENCOUNTER — Ambulatory Visit (INDEPENDENT_AMBULATORY_CARE_PROVIDER_SITE_OTHER): Payer: Medicaid Other

## 2019-02-19 DIAGNOSIS — Z825 Family history of asthma and other chronic lower respiratory diseases: Secondary | ICD-10-CM | POA: Diagnosis not present

## 2019-02-19 DIAGNOSIS — J441 Chronic obstructive pulmonary disease with (acute) exacerbation: Secondary | ICD-10-CM | POA: Insufficient documentation

## 2019-02-19 DIAGNOSIS — Z20822 Contact with and (suspected) exposure to covid-19: Secondary | ICD-10-CM | POA: Insufficient documentation

## 2019-02-19 DIAGNOSIS — I10 Essential (primary) hypertension: Secondary | ICD-10-CM | POA: Diagnosis not present

## 2019-02-19 DIAGNOSIS — R109 Unspecified abdominal pain: Secondary | ICD-10-CM | POA: Insufficient documentation

## 2019-02-19 DIAGNOSIS — Z8249 Family history of ischemic heart disease and other diseases of the circulatory system: Secondary | ICD-10-CM | POA: Diagnosis not present

## 2019-02-19 DIAGNOSIS — Z87891 Personal history of nicotine dependence: Secondary | ICD-10-CM | POA: Diagnosis not present

## 2019-02-19 DIAGNOSIS — Z79899 Other long term (current) drug therapy: Secondary | ICD-10-CM | POA: Diagnosis not present

## 2019-02-19 DIAGNOSIS — Z888 Allergy status to other drugs, medicaments and biological substances status: Secondary | ICD-10-CM | POA: Diagnosis not present

## 2019-02-19 DIAGNOSIS — Z7951 Long term (current) use of inhaled steroids: Secondary | ICD-10-CM | POA: Insufficient documentation

## 2019-02-19 DIAGNOSIS — R0602 Shortness of breath: Secondary | ICD-10-CM | POA: Diagnosis not present

## 2019-02-19 LAB — POCT URINALYSIS DIP (DEVICE)
Bilirubin Urine: NEGATIVE
Glucose, UA: 500 mg/dL — AB
Hgb urine dipstick: NEGATIVE
Ketones, ur: NEGATIVE mg/dL
Leukocytes,Ua: NEGATIVE
Nitrite: NEGATIVE
Protein, ur: NEGATIVE mg/dL
Specific Gravity, Urine: >=1.03 (ref 1.005–1.030)
Urobilinogen, UA: 0.2 mg/dL (ref 0.0–1.0)
pH: 5.5 (ref 5.0–8.0)

## 2019-02-19 MED ORDER — DEXAMETHASONE 4 MG PO TABS
ORAL_TABLET | ORAL | Status: AC
  Filled 2019-02-19: qty 1

## 2019-02-19 MED ORDER — DEXAMETHASONE 4 MG PO TABS
ORAL_TABLET | ORAL | Status: AC
  Filled 2019-02-19: qty 2

## 2019-02-19 MED ORDER — DEXAMETHASONE 2 MG PO TABS
ORAL_TABLET | ORAL | Status: AC
  Filled 2019-02-19: qty 1

## 2019-02-19 MED ORDER — DEXAMETHASONE SODIUM PHOSPHATE 10 MG/ML IJ SOLN
10.0000 mg | Freq: Once | INTRAMUSCULAR | Status: AC
  Administered 2019-02-19: 11:00:00 10 mg

## 2019-02-19 MED ORDER — AZITHROMYCIN 250 MG PO TABS: 250.0000 mg | ORAL_TABLET | Freq: Every day | ORAL | 0 refills | Status: DC

## 2019-02-19 MED ORDER — PREDNISONE 50 MG PO TABS: 50.0000 mg | ORAL_TABLET | Freq: Every day | ORAL | 0 refills | Status: AC

## 2019-02-19 MED ORDER — CYCLOBENZAPRINE HCL 5 MG PO TABS: 5.0000 mg | ORAL_TABLET | Freq: Two times a day (BID) | ORAL | 0 refills | Status: DC | PRN

## 2019-02-19 MED FILL — AZITHROMYCIN 250 MG TABLET: 250 | 5 days supply | Qty: 6 | Fill #0

## 2019-02-19 MED FILL — CYCLOBENZAPRINE 5 MG TABLET: 5 | 6 days supply | Qty: 24 | Fill #0

## 2019-02-19 MED FILL — ?PREDNISONE 5 MG TABLET: 5 MG | 10 days supply | Qty: 50 | Fill #0

## 2019-02-19 NOTE — ED Triage Notes (Signed)
Pt states he has kidney pain. Pt states he hurts a lot in that area. This started yesterday. Pt states he has a SOB at times. Pt thinks he may have pneumonia.

## 2019-02-19 NOTE — Discharge Instructions (Addendum)
We gave you Decadron today, continue with prednisone daily for the next 5 days Begin azithromycin-2 tablets today, 1 tablet for the following 4 days May try using Flexeril which is a muscle relaxer at home, may cause drowsiness, do not drive or work after taking  If you develop worsening flank pain or abdominal pain please follow-up in the emergency room for further evaluation

## 2019-02-19 NOTE — ED Provider Notes (Signed)
[place]    CSN: [id number] Arrival date & time: 02/19/19  [id number]      History   Chief Complaint Chief Complaint  Patient presents with  . kidney pain    HPI James [name] is a 59 y.o. male history of COPD, hypertension, presenting today for evaluation of right flank pain.  Flank pain began yesterday.  States that it is a very sharp stabbing pain.  Has worsened since onset.  Feels pain in his lower abdomen.  Denies radiation into testicles.  Denies any urination symptoms of dysuria, increased frequency, urgency, hesitancy.  Denies hematuria.  Denies history of kidney stones.  He has felt short of breath at times and is concerned about pneumonia.  Denies any nausea or vomiting.  Denies fevers.  Denies any URI symptoms of cough congestion or sore throat.  Cough is at baseline.  Denies any fall, trauma or increase in heavy lifting.  HPI  Past Medical History:  Diagnosis Date  . Acute on chronic respiratory failure with hypoxia ([place]) 06/08/2013  . CAP (community acquired pneumonia) 05/17/2018   See admit 05/17/18 ? rml  with   covid pcr neg - rx augmentin > f/u cxr in 4-6 weeks is fine unless condition declines   . COPD (chronic obstructive pulmonary disease) ([place])    not on home  . Dyspnea   . Hypertension   . Pneumonia 04/06/2016  . Pneumothorax    2016, fell from ladder  . 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 Tick bite 06/03/2018    Patient Active Problem List   Diagnosis Date Noted  . 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 Marijuana abuse 08/09/2018  . COPD exacerbation ([place]) 02/16/2018  . Hypertension 05/17/2016  . Low back pain radiating to both legs 04/18/2016  . History of tobacco abuse 04/06/2016  . Alcohol abuse 04/06/2016  . COPD mixed type ([place]) 03/26/2016    Past Surgical History:  Procedure Laterality Date  . VIDEO ASSISTED THORACOSCOPY (VATS)/THOROCOTOMY Left 06/11/2013   Procedure: VIDEO ASSISTED THORACOSCOPY (VATS)/THOROCOTOMY;  Surgeon: Ivin Poot, MD;  Location: [place];  Service: Thoracic;  Laterality: Left;  [name] VIDEO BRONCHOSCOPY N/A 06/11/2013   Procedure: VIDEO BRONCHOSCOPY;  Surgeon: Ivin Poot, MD;  Location: [place];  Service: Thoracic;  Laterality: N/A;  . VIDEO BRONCHOSCOPY N/A 08/10/2018   Procedure: VIDEO BRONCHOSCOPY WITH FLUORO;  Surgeon: Candee Furbish, MD;  [facility] ENDOSCOPY;  Service: Endoscopy;  Laterality: N/A;       Home Medications    Prior to Admission medications   Medication Sig Start Date End Date Taking? Authorizing Provider  albuterol (PROVENTIL HFA;VENTOLIN HFA) 108 (90 Base) MCG/ACT inhaler Inhale 1-2 puffs into the lungs every 6 (six) hours as needed for wheezing or shortness of breath. 03/29/18   Scot Jun, FNP  albuterol (PROVENTIL) (2.5 MG/3ML) 0.083% nebulizer solution Take 3 mLs (2.5 mg total) by nebulization every 6 (six) hours as needed for wheezing or shortness of breath. 06/20/18   Scot Jun, FNP  azithromycin (ZITHROMAX) 250 MG tablet Take 1 tablet (250 mg total) by mouth daily. Take first 2 tablets together, then 1 every day until finished. 02/19/19   Wieters, Hallie C, PA-C  cyclobenzaprine (FLEXERIL) 5 MG tablet Take 1-2 tablets (5-10 mg total) by mouth 2 (two) times daily as needed for muscle spasms. 02/19/19   Wieters, Hallie C, PA-C  folic acid (FOLVITE) 1 MG tablet Take 1 tablet (1 mg total) by mouth daily. 09/28/18  [name], MD  levofloxacin (LEVAQUIN) 500 MG tablet Take 1 tablet (500 mg total) by mouth daily. Patient not taking: Reported on 12/20/2018 12/14/18   [name], MD  losartan (COZAAR) 100 MG tablet TAKE 1 TABLET (100 MG TOTAL) BY MOUTH DAILY. 01/12/19   [name], MD  mometasone-formoterol (DULERA) 200-5 MCG/ACT AERO Inhale 2 puffs into the lungs 2 (two) times daily. 07/18/18   Scot Jun, FNP  predniSONE (DELTASONE) 50 MG tablet Take 1 tablet (50 mg total) by mouth daily for 5 days. 02/19/19 02/24/19  Wieters, Elesa Hacker, PA-C   Respiratory Therapy Supplies (FLUTTER) DEVI Use 4 times daily 04/27/18   [name], MD  Spacer/Aero-Holding Chambers (AEROCHAMBER MV) inhaler Use as instructed 08/03/18   [name], MD  thiamine (VITAMIN B-1) 100 MG tablet Take 1 tablet (100 mg total) by mouth daily. 08/03/18   [name], MD  traMADol (ULTRAM) 50 MG tablet Take 1 tablet (50 mg total) by mouth every 8 (eight) hours as needed. 01/16/19   Jaynee Eagles, PA-C  umeclidinium bromide (INCRUSE ELLIPTA) 62.5 MCG/INH AEPB Inhale 1 puff into the lungs daily. 07/25/18   [name], MD    Family History Family History  Problem Relation Age of Onset  . Heart disease Father   . COPD Sister     Social History Social History   Tobacco Use  . Smoking status: Former Smoker    Packs/day: 1.50    Years: 40.00    Pack years: 60.00    Types: Cigarettes    Quit date: 2014    Years since quitting: 7.1  . Smokeless tobacco: Current User    Types: Chew  Substance Use Topics  . Alcohol use: Yes    Alcohol/week: 28.0 standard drinks    Types: 28 Cans of beer per week    Comment: 4 beers a night; + jitters with not drinking, denies DTs or seizures  . Drug use: Yes    Types: Marijuana    Comment: daily; quit using crack and methamphetamine about 5 months ago      Allergies   Gabapentin   Review of Systems Review of Systems  Constitutional: Negative for activity change, appetite change, chills, fatigue and fever.  HENT: Negative for congestion, ear pain, rhinorrhea, sinus pressure, sore throat and trouble swallowing.   Eyes: Negative for discharge and redness.  Respiratory: Positive for shortness of breath and wheezing. Negative for cough and chest tightness.   Cardiovascular: Negative for chest pain.  Gastrointestinal: Positive for abdominal pain. Negative for diarrhea, nausea and vomiting.  Genitourinary: Positive for flank pain. Negative for decreased urine volume, difficulty urinating, dysuria,  hematuria, scrotal swelling, testicular pain and urgency.  Musculoskeletal: Negative for myalgias.  Skin: Negative for rash.  Neurological: Negative for dizziness, light-headedness and headaches.     Physical Exam Triage Vital Signs ED Triage Vitals  Enc Vitals Group     BP 02/19/19 0950 126/71     Pulse Rate 02/19/19 0950 89     Resp 02/19/19 0950 19     Temp 02/19/19 0950 99.7 F (37.6 C)     Temp Source 02/19/19 0950 Oral     SpO2 02/19/19 0950 98 %     Weight 02/19/19 0949 180 lb (81.6 kg)     Height --      Head Circumference --      Peak Flow --      Pain Score 02/19/19 0951 10  Pain Loc --      Pain Edu? --      Excl. in [place]? --    No data found.  Updated Vital Signs BP 126/71 (BP Location: Right Arm)   Pulse 89   Temp 99.7 F (37.6 C) (Oral)   Resp 19   Wt 180 lb (81.6 kg)   SpO2 98%   BMI 26.20 kg/m   Visual Acuity Right Eye Distance:   Left Eye Distance:   Bilateral Distance:    Right Eye Near:   Left Eye Near:    Bilateral Near:     Physical Exam Vitals and nursing note reviewed.  Constitutional:      Appearance: He is well-developed.  HENT:     Head: Normocephalic and atraumatic.     Ears:     Comments: Bilateral ears without tenderness to palpation of external auricle, tragus and mastoid, EAC's without erythema or swelling, TM's with good bony landmarks and cone of light. Non erythematous.     Mouth/Throat:     Comments: Oral mucosa pink and moist, no tonsillar enlargement or exudate. Posterior pharynx patent and nonerythematous, no uvula deviation or swelling. Normal phonation. Eyes:     Conjunctiva/sclera: Conjunctivae normal.  Cardiovascular:     Rate and Rhythm: Normal rate and regular rhythm.     Heart sounds: No murmur.  Pulmonary:     Effort: Pulmonary effort is normal. No respiratory distress.     Breath sounds: Wheezing and rhonchi present.     Comments: Significant inspiratory and expiratory wheezing and rhonchi throughout  bilateral lung fields Abdominal:     Palpations: Abdomen is soft.     Tenderness: There is abdominal tenderness.     Comments: Soft, nondistended, tender to palpation to far right abdomen and right lower quadrant, negative rebound, negative McBurney's, negative Rovsing, negative Murphy's  Musculoskeletal:     Cervical back: Neck supple.     Comments: Back: Nontender to palpation of thoracic and lumbar spine midline, nontender throughout palpation of thoracic musculature, tender to palpation to right lateral lumbar musculature extending into right flank  Appears to have increased pain with change in position  Skin:    General: Skin is warm and dry.  Neurological:     Mental Status: He is alert.      UC Treatments / Results  Labs (all labs ordered are listed, but only abnormal results are displayed) Labs Reviewed  POCT URINALYSIS DIP (DEVICE) - Abnormal; Notable for the following components:      Result Value   Glucose, UA 500 (*)    All other components within normal limits  NOVEL CORONAVIRUS, NAA (HOSP ORDER, SEND-OUT TO REF LAB; TAT 18-24 HRS)    EKG   Radiology DG Abd Acute W/Chest  Result Date: 02/19/2019 CLINICAL DATA:  Right flank pain. Shortness of breath. EXAM: DG ABDOMEN ACUTE W/ 1V CHEST COMPARISON:  Two view chest 11/09/2018. FINDINGS: The lungs are clear without focal pneumonia, edema, pneumothorax or pleural effusion. Stable scarring left costophrenic angle. The cardiopericardial silhouette is within normal limits for size. Bilateral nonacute rib fractures evident, left greater than right. Upright film shows no evidence for intraperitoneal free air. There is no evidence for gaseous bowel dilation to suggest obstruction. No unexpected abdominopelvic calcification. IMPRESSION: Negative abdominal radiographs. No acute cardiopulmonary disease. Electronically Signed   By: Misty Stanley M.D.   On: 02/19/2019 10:41    Procedures Procedures (including critical care time)   Medications Ordered in UC Medications  dexamethasone (  DECADRON) injection 10 mg (10 mg Other Given 02/19/19 1106)    Initial Impression / Assessment and Plan / UC Course  I have reviewed the triage vital signs and the nursing notes.  Pertinent labs & imaging results that were available during my care of the patient were reviewed by me and considered in my medical decision making (see chart for details).    COVID PCR pending given low grade fever and SOB. UA without hemoglobin, negative leuks and nitrites.  Less suspicious of UTI or nephrolithiasis.  X-ray without sign of kidney stone, with this in combination with no hematuria felt this is less likely.  Significant wheezing in the lungs, no sign of pneumonia, will treat for COPD exacerbation with steroids and azithromycin.  Continue inhalers at home.  Pain reproducible with palpation, pain more flank/lumbar area rather than abdomen, will see if pain improves with prednisone for possible MSK cause, otherwise recommending further evaluation in emergency room if pain worsening or developing more abdominal pain this may need CT scanning.  Discussed strict return precautions. Patient verbalized understanding and is agreeable with plan.  Final Clinical Impressions(s) / UC Diagnoses   Final diagnoses:  Right flank pain  COPD exacerbation ([place])     Discharge Instructions     We gave you Decadron today, continue with prednisone daily for the next 5 days Begin azithromycin-2 tablets today, 1 tablet for the following 4 days May try using Flexeril which is a muscle relaxer at home, may cause drowsiness, do not drive or work after taking  If you develop worsening flank pain or abdominal pain please follow-up in the emergency room for further evaluation    ED Prescriptions    Medication Sig Dispense Auth. Provider   predniSONE (DELTASONE) 50 MG tablet Take 1 tablet (50 mg total) by mouth daily for 5 days. 5 tablet Wieters, Hallie C, PA-C    azithromycin (ZITHROMAX) 250 MG tablet Take 1 tablet (250 mg total) by mouth daily. Take first 2 tablets together, then 1 every day until finished. 6 tablet Wieters, Hallie C, PA-C   cyclobenzaprine (FLEXERIL) 5 MG tablet Take 1-2 tablets (5-10 mg total) by mouth 2 (two) times daily as needed for muscle spasms. 24 tablet Wieters, [place] C, PA-C     PDMP not reviewed this encounter.   Janith [name], PA-C 02/19/19 1127

## 2019-02-21 LAB — NOVEL CORONAVIRUS, NAA (HOSP ORDER, SEND-OUT TO REF LAB; TAT 18-24 HRS): SARS-CoV-2, NAA: NOT DETECTED

## 2019-02-27 ENCOUNTER — Ambulatory Visit: Payer: Medicaid Other | Attending: Critical Care Medicine | Admitting: Critical Care Medicine

## 2019-02-27 ENCOUNTER — Other Ambulatory Visit: Payer: Self-pay

## 2019-02-27 ENCOUNTER — Encounter: Payer: Self-pay | Admitting: Critical Care Medicine

## 2019-02-27 VITALS — BP 120/85 | HR 101 | Temp 98.1°F | Resp 18 | Ht 69.0 in | Wt 187.0 lb

## 2019-02-27 DIAGNOSIS — Z87891 Personal history of nicotine dependence: Secondary | ICD-10-CM | POA: Insufficient documentation

## 2019-02-27 DIAGNOSIS — Z77120 Contact with and (suspected) exposure to mold (toxic): Secondary | ICD-10-CM | POA: Insufficient documentation

## 2019-02-27 DIAGNOSIS — Z79899 Other long term (current) drug therapy: Secondary | ICD-10-CM | POA: Insufficient documentation

## 2019-02-27 DIAGNOSIS — Z8249 Family history of ischemic heart disease and other diseases of the circulatory system: Secondary | ICD-10-CM | POA: Insufficient documentation

## 2019-02-27 DIAGNOSIS — Z7951 Long term (current) use of inhaled steroids: Secondary | ICD-10-CM | POA: Insufficient documentation

## 2019-02-27 DIAGNOSIS — J449 Chronic obstructive pulmonary disease, unspecified: Secondary | ICD-10-CM | POA: Diagnosis present

## 2019-02-27 DIAGNOSIS — Z825 Family history of asthma and other chronic lower respiratory diseases: Secondary | ICD-10-CM | POA: Diagnosis not present

## 2019-02-27 DIAGNOSIS — J441 Chronic obstructive pulmonary disease with (acute) exacerbation: Secondary | ICD-10-CM | POA: Insufficient documentation

## 2019-02-27 DIAGNOSIS — I10 Essential (primary) hypertension: Secondary | ICD-10-CM | POA: Diagnosis not present

## 2019-02-27 DIAGNOSIS — Z888 Allergy status to other drugs, medicaments and biological substances status: Secondary | ICD-10-CM | POA: Insufficient documentation

## 2019-02-27 MED ORDER — PREDNISONE 10 MG PO TABS: ORAL_TABLET | ORAL | 1 refills | Status: DC

## 2019-02-27 MED FILL — ?PREDINSONE 10MG TABLETS: 10 | 30 days supply | Qty: 45 | Fill #0

## 2019-02-27 NOTE — Assessment & Plan Note (Signed)
Hypertension under good control we will continue the current program of losartan

## 2019-02-27 NOTE — Patient Instructions (Signed)
Begin prednisone at 40 mg a day dose for 5 days then reduce to 1 a day and stay  No other medication changes  Pick up your prednisone today at the pharmacy here  Return in follow-[name]

## 2019-02-27 NOTE — Assessment & Plan Note (Signed)
COPD with acute exacerbation of same  Significant mold exposure in the home as a driving factor, note patient is no longer smoking  Plan is for the patient continue Dulera 2 inhalations twice daily Incruse daily and begin repulse of prednisone 40 mg a day and after 5 days taper to 1 tablet daily and hold  No antibiotics are indicated

## 2019-02-27 NOTE — Progress Notes (Signed)
olu  Subjective:    Patient ID: James [name], male    DOB: Dec 16, 1960, 59 y.o.   MRN: [id number]  58 y.o.M with advanced Copd   I saw this patient a week ago and prescribed antibiotics and prednisone for what I perceived to be persisting pneumonia.  The patient had a slight improvement but then worsened and came to the emergency room the end of the week and was admitted for 3 days for COPD exacerbation and lower lobe pneumonia.  Chest x-ray did show persistent infiltrates in the lower lobes.  The patient is now discharged and returns to the office for post hospital follow-up.  Below is the discharge summary Dc summary : Admit date: 07/31/2018 Discharge date: 08/01/2018  Time spent: 45 minutes  Recommendations for Outpatient Follow-up:  Patient will be discharged to home.  Patient will need to follow up with primary care provider within one week of discharge.  Patient should continue medications as prescribed.  Patient should follow a heart healthy diet.   Discharge Diagnoses:  Acute on chronic respiratory failure with hypoxia secondary to COPD Exacerbation and pneumonia Essential hypertension  Discharge Condition: Stable  Diet recommendation: heart healthy  Filed Weights  07/31/18 0231 07/31/18 0602 Weight: 85.3 kg 83.4 kg   History of present illness:  on 07/31/2018 by Dr. Candie Mile [name] a 59 y.o.malewith medical history significant ofCOPD. Patient has had 3 prior admits for COPD / PNA thus far this year, most recently in May.  Patient has been being treated for COPD exacerbation and BLL CAP as outpatient by [name] with levaquin and prednisone. Just finished levaquin yesterday. Prednisone remained at 40mg  daily with plans to initiate a slow taper.  Despite this, patient developed severe SOB and respiratory distress this evening. EMS called and patient noted to be satting 80% on RA. He is not on chronic O2 for his COPD.  Hospital Course:    Acute on chronic respiratory failure with hypoxia secondary to COPD Exacerbation and pneumonia -Patient noted to have oxygen saturations in the 80s on room air -Chest x-ray noted for pneumonia -Was recently placed on Levaquin and steroids by PCP approximately 1 week ago -On admission, patient continued to have wheezing well into his hospital stay day 1.  Patient currently still has some wheezing however has improved. -Was placed on Solu-Medrol, along with nebulizer treatments, azithromycin and ceftriaxone -Patient did require oxygen upon admission however has been able to ambulate and maintain oxygen saturations in the high 90s on room air. -Will discharge patient with prednisone taper, azithromycin and Ceftin. -Given that patient had oxygen saturations in the 80s on admission, it was thought that he would require several days in the hospital. Patient actually improved quicker than expected.  Essential hypertension -Continue losartan  Since discharge the patient is tried to go back to work but has extreme difficulty in the heat with this.  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  10/05/2018 This patient's not been seen since the end of July he had failed several follow-up visits.  He did see Dr. [name] end of August who did not make major changes in  his medication program.  He is to remain on [name] and Incruse.  The patient after being admitted in July got readmitted early August and then again in early September.  Discharge summaries are as noted below.  During the August admission the patient underwent bronchoscopy and had mucous plugging removed the culture showed normal  flora Note this patient has not been actively smoking recently Admit date: 08/09/2018 Discharge date: 08/11/2018  Discharge Diagnoses:  Acute on chronic hypoxic respiratory failure/sepsis due to multifocal pneumonia COPD Alcohol/marijuana use Essential hypertension  History of present illness:  on 08/09/2018 by Dr. [name] a 59 y.o.malewith medical history significant ofCOPD and ETOH dependence presenting with respiratory distress.He woke up about 530 this AM and couldn't breathe. This just keeps happening. He felt similarly when hospitalized last week. He has been doing fine until today. No h/o OSA, not on CPAP. Not on home O2. +cough, productive of yellowish sputum. It is sometimes hard to get the sputum up. No fever. +wheezing since his last hospitalization. Quit smoking in 2014. He lives in a non-air-conditioned log cabin.  He was hospitalized from 7/27-28 for acute on chronic respiratory failure associated with COPD exacerbation and PNA. This was his 4th admission this year. He was seen by [name] on 7/30 with plan for completion of course of Keflex and prednisone, as well as prednisone taper.  Hospital Course:  Acute on chronic hypoxic respiratory failure/sepsis due to multifocal pneumonia -Was recently hospitalized in late July for pneumonia and COPD exacerbation. Was discharged with antibiotics as well as prednisone taper. Patient followed up with [name] on 08/03/2018 with completion of Keflex and prednisone as well. -This is his fourth admission this year for similar presentation -Patient presented with cough and shortness of breath -Chest x-rayon admissionfindings consistent with right-sided multifocal pneumonia -CTA chest showed multifocal pneumonia on the right. COPD including emphysema. Negative for PE. -COVID negative -Upon admission, patient had leukocytosis, fever, tachycardia, tachypnea- all resolving  -Noted to be  hypoxic 88% on room air in the ED -Urine strep pneumoniaand legionellaantigensnegative -Sputum culture: Few GPC, GVR, rare yeast -Blood cultures show no growth -was placed on vancomycin, Zosyn, Solu-Medrol, supplemental oxygen -S/p bronchoscopy 8/6, pending culture results -CXR obtained after bronch on 8/6: almost complete clearing of the hazy infiltrates in the right lung -Discussed with Dr. Tamala Julian (pulm), recommended prednisone taper starting with prednisone 40mg  daily x 1 week, taper as an outpatient, along with Augmentin. He will follow up with the patient in one week. Given that infection cleared on xray so quickly, question if this is inflammatory vs infection.  -patient able to ambulate today without oxygen and maintained oxygen saturations in the mid 90s- therefore patient does not qualify for home oxygen -patient also had a swallow eval- mild aspiration risk  COPD -Continue nebulizer treatments, steroids, inhalers -Incentive spirometry, flutter valve  Alcohol/marijuana use -Discussed cessation -was placed on continue CIWA protocol  Essential hypertension -Continue Cozaar  Consultants PCCM- pulmonology   Procedures Bronchoscopy   Recommendations for Outpatient Follow-up:  1. Follow up with PCP in 1-2 weeks 2. Please obtain BMP/CBC in one week your next doctors visit.  3. Augmentin and azithromycin for 5 days 4. Advised to continue using bronchodilators every 4-6 hours for the next 4-5 days and then transition to as needed. 5. Advised to take his home prednisone, for next 3 days advised to increase it to 40 mg twice daily then resume back is 20 mg daily  Discharge Condition: Stable  CODE STATUS: Full code Diet recommendation: 2 g salt   DC summary from 09/11/18  Adm 9/7  D/C 09/13/18 Brief/Interim Summary: James [name] a 59 y.o.malewith history ofCOPD on prednisone, HTN, alcohol use disorder, marijuana use,tobacco user. Patient presented secondary to  dyspnea and found to have evidence of pneumonia and a COPD exacerbation in addition to sepsis physiology on admission.  There is concerns of possible right lower lobe pneumonia therefore started on Unasyn and azithromycin due to concerns of aspiration.  This was transitioned to Augmentin and azithromycin for total of 5 days at time of discharge.  Secondary to infection, he was having COPD exacerbation therefore on bronchodilators.  He started improving and on the day of discharge he was adamant about going home as he was feeling much better.  He was still having abnormal breath sounds and cough therefore I had requested him to stay another day in the hospital but still wanted to go home.  Instructions were given to him take 40 mg of prednisone for next 3 days and then he can resume back his 20 mg daily.  Advised... home bronchodilators periodically every 4-6 hours for next 4-5 days and then transition to as needed. Advised to follow-up with his PCP in about 1 week.   Discharge Diagnoses:  Active Problems:   Hypertension   COPD exacerbation ([place])   Marijuana abuse   Pneumonia   Sepsis ([place])  Acute respiratory distress secondary to right lower lobe pneumonia, community-acquired/aspiration Acute mild exacerbation of COPD Concern for possible aspiration.  Unasyn and azithromycin transition to Augmentin and azithromycin.  We will give him 5 more day course at the time of discharge 40 mg of prednisone for next 3 days then transition to 20 mg daily which is his home dose Advised to use bronchodilators.3  Alcohol use disorder Counseled to quit drinking alcohol.  Advised to continue thiamine, folate and multivitamin  Weakness -He did well with physical therapy.  Tobacco use Marijuana use Cessation discussed on admission  Consultations:  None  Subjective: Still having some cough and congestion but he is adamant about going home today as he is feeling much better than from admission.  I  recommended he stay in the hospital for at least 1 more day to help him feel even better.  He wished to go home. Ambulating in the hallway without additional saturating greater than 90% on room air.   Since discharge this patient went to urgent care on 21 September with an outbreak of shingles over his left lower back.  He is taken a course of valacyclovir and prednisone.  He has severe pain.  He was given gabapentin but this caused hallucinations and then we tried tramadol but this caused severe nausea and vomiting.  Apparently the patient cannot take Tylenol or nonsteroidal anti-inflammatories as well.  The patient states his breathing is been labored.  He still has productive cough of white mucus.  He has no fever.  He has significant back pain because of the shingles. The patient is maintaining the [facility] and Incruse as well as his antihypertensive medications and vitamins     10/12/2018  This patient is seen in return follow-up for acute shingles with postherpetic neuropathy, recent pneumonia, COPD exacerbation, and hypertension.  Patient is markedly improved from the last visit.  He has less cough less shortness of breath.  He has improved pain around the area of the shingles.  He is taking amitriptyline 25 mg at bedtime  The patient notes less  pain.  He does state he had been using the tramadol without nausea or vomiting.  I asked if patient would be interested in the para medicine program and expressed interest in the community para medicine program  11/23/2018 Patient seen in return follow-up and has noted increasing shortness of breath and we had to resume prednisone this patient.  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  He does maintain his inhalers.  He is drinking 4 beers a night.  He is not smoking.  Note the patient continues to live in a home that has standing water under the foundation and in several the rooms and mold on the carpet.  We have connected this patient with legal  aid to see if they can assist him in getting into better housing.  The patient's not had previous allergy testing   12/13/2018 The patient notes increased shortness of breath, wheezing and cough productive of thick yellow mucus.  He is using the flutter valve but is not being is productive with this.  He denies chest pain or fever.  He is down to 2 a day on the prednisone.  He is maintaining his inhalers. Apparently there is repair is being done on the patient's home and he is decided to stay in the home  02/27/2019 This patient is seen in return follow-up for COPD.  The patient has significant mold exposure in the home.  There has been some mitigation of this but is not complete.  The patient just was in the urgent care for COPD exacerbation a week ago treated with a course of antibiotics and prednisone.  He is now off prednisone. Patient states the pain in the back has resolved however he is still having increased shortness of breath.  He still having productive cough of thick yellow mucus.  Shortness of Breath This is a chronic problem. The current episode started more than 1 year ago. The problem occurs daily (qhs shortness of breath). The problem has been rapidly worsening. Associated symptoms include chest pain, PND, sputum production and wheezing. Pertinent negatives include no claudication, fever, headaches, hemoptysis, leg pain, leg swelling, orthopnea, rash, sore throat, syncope or vomiting. The symptoms are aggravated by any activity and weather changes. Associated symptoms comments: Pain radiates to arm Legs go to sleep No dysphagia No gerd . Risk factors include smoking. He has tried beta agonist inhalers, oral steroids and steroid inhalers for the symptoms. The treatment provided moderate relief. His past medical history is significant for COPD and pneumonia. There is no history of asthma, CAD, DVT, a heart failure or PE.    Past Medical History:  Diagnosis Date  . Acute on chronic  respiratory failure with hypoxia ([place]) 06/08/2013  . CAP (community acquired pneumonia) 05/17/2018   See admit 05/17/18 ? rml  with   covid pcr neg - rx augmentin > f/u cxr in 4-6 weeks is fine unless condition declines   . COPD (chronic obstructive pulmonary disease) ([place])    not on home  . Dyspnea   . Hypertension   . Pneumonia 04/06/2016  . Pneumothorax    2016, fell from ladder  . 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 Tick bite 06/03/2018     Family History  Problem Relation Age of Onset  . Heart disease Father   . COPD Sister      Social History   Socioeconomic History  . Marital status: Divorced    Spouse name: Not on file  . Number of children: Not on file  .  Years of education: Not on file  . Highest education level: Not on file  Occupational History  . Occupation: Dealer  . Occupation: Painter  Tobacco Use  . Smoking status: Former Smoker    Packs/day: 1.50    Years: 40.00    Pack years: 60.00    Types: Cigarettes    Quit date: 2014    Years since quitting: 7.1  . Smokeless tobacco: Current User    Types: Chew  Substance and Sexual Activity  . Alcohol use: Yes    Alcohol/week: 28.0 standard drinks    Types: 28 Cans of beer per week    Comment: 4 beers a night; + jitters with not drinking, denies DTs or seizures  . Drug use: Yes    Types: Marijuana    Comment: daily; quit using crack and methamphetamine about 5 months ago   . Sexual activity: Not Currently    Partners: Female  Other Topics Concern  . Not on file  Social History Narrative   Lives alone near [place]   Social Determinants of Health   Financial Resource Strain: High Risk  . Difficulty of Paying Living Expenses: Hard  Food Insecurity: Unknown  . Worried About Charity fundraiser in the Last Year: Not on file  . Ran Out of Food in the Last Year: Never true  Transportation Needs: No Transportation Needs  . Lack of Transportation (Medical): No  . Lack of Transportation (Non-Medical):  No  Physical Activity: Inactive  . Days of Exercise per Week: 0 days  . Minutes of Exercise per Session: 0 min  Stress: Stress Concern Present  . Feeling of Stress : To some extent  Social Connections: Slightly Isolated  . Frequency of Communication with Friends and Family: Three times a week  . Frequency of Social Gatherings with Friends and Family: Once a week  . Attends Religious Services: 1 to 4 times per year  . Active Member of Clubs or Organizations: Yes  . Attends Archivist Meetings: 1 to 4 times per year  . Marital Status: Divorced  Human resources officer Violence:   . Fear of Current or Ex-Partner: Not on file  . Emotionally Abused: Not on file  . Physically Abused: Not on file  . Sexually Abused: Not on file     Allergies  Allergen Reactions  . Gabapentin Other (See Comments)    hallucinations     Outpatient Medications Prior to Visit  Medication Sig Dispense Refill  . albuterol (PROVENTIL HFA;VENTOLIN HFA) 108 (90 Base) MCG/ACT inhaler Inhale 1-2 puffs into the lungs every 6 (six) hours as needed for wheezing or shortness of breath. 1 Inhaler 5  . albuterol (PROVENTIL) (2.5 MG/3ML) 0.083% nebulizer solution Take 3 mLs (2.5 mg total) by nebulization every 6 (six) hours as needed for wheezing or shortness of breath. 761 mL 1  . folic acid (FOLVITE) 1 MG tablet Take 1 tablet (1 mg total) by mouth daily. 90 tablet 1  . losartan (COZAAR) 100 MG tablet TAKE 1 TABLET (100 MG TOTAL) BY MOUTH DAILY. 30 tablet 2  . mometasone-formoterol (DULERA) 200-5 MCG/ACT AERO Inhale 2 puffs into the lungs 2 (two) times daily. 13 g 2  . Respiratory Therapy Supplies (FLUTTER) DEVI Use 4 times daily 1 each 0  . Spacer/Aero-Holding Chambers (AEROCHAMBER MV) inhaler Use as instructed 1 each 0  . thiamine (VITAMIN B-1) 100 MG tablet Take 1 tablet (100 mg total) by mouth daily. 30 tablet 3  . traMADol (ULTRAM) 50 MG  tablet Take 1 tablet (50 mg total) by mouth every 8 (eight) hours as needed.  10 tablet 0  . umeclidinium bromide (INCRUSE ELLIPTA) 62.5 MCG/INH AEPB Inhale 1 puff into the lungs daily. 30 each 5  . azithromycin (ZITHROMAX) 250 MG tablet Take 1 tablet (250 mg total) by mouth daily. Take first 2 tablets together, then 1 every day until finished. 6 tablet 0  . cyclobenzaprine (FLEXERIL) 5 MG tablet Take 1-2 tablets (5-10 mg total) by mouth 2 (two) times daily as needed for muscle spasms. (Patient not taking: Reported on 02/27/2019) 24 tablet 0  . levofloxacin (LEVAQUIN) 500 MG tablet Take 1 tablet (500 mg total) by mouth daily. (Patient not taking: Reported on 12/20/2018) 5 tablet 0   No facility-administered medications prior to visit.     Review of Systems  Constitutional: Negative for fever.  HENT: Negative for sinus pressure, sinus pain, sore throat and trouble swallowing.   Eyes: Negative.   Respiratory: Positive for cough, sputum production, chest tightness, shortness of breath and wheezing. Negative for hemoptysis.   Cardiovascular: Positive for chest pain and PND. Negative for palpitations, orthopnea, claudication, leg swelling and syncope.  Gastrointestinal: Negative for nausea and vomiting.  Genitourinary: Negative.   Musculoskeletal: Positive for back pain.  Skin: Negative for rash.  Neurological: Negative for headaches.  Psychiatric/Behavioral: Negative for hallucinations. The patient is not nervous/anxious and is not hyperactive.        Objective:   Physical Exam Vitals:   02/27/19 0832  BP: 120/85  Pulse: (!) 101  Resp: 18  Temp: 98.1 F (36.7 C)  TempSrc: Oral  SpO2: 96%  Weight: 187 lb (84.8 kg)  Height: 5\' 9"  (1.753 m)    Gen: , well-nourished, in no distress,   ENT: No lesions,  mouth clear,  oropharynx clear, no postnasal drip  Neck: No JVD, no TMG, no carotid bruits  Lungs: No use of accessory muscles, no dullness to percussion, inspiratory and expiratory wheeze with poor airflow  Cardiovascular: RRR, heart sounds normal, no  murmur or gallops, no peripheral edema  Abdomen: soft and NT, no HSM,  BS normal  Musculoskeletal: No deformities, no cyanosis or clubbing  Neuro: alert, non focal  Skin: Warm, no rash Media Information    Document Information  Photos    11/02/2018 14:43  Attached To:  [name]  Source Information  [name], MD  [facility]    2/15 CXR:/ABD NAD   BMP Latest Ref Rng & Units 09/12/2018 09/11/2018 08/11/2018  Glucose 70 - 99 mg/dL 121(H) 139(H) 195(H)  BUN 6 - 20 mg/dL 18 18 16   Creatinine 0.61 - 1.24 mg/dL 0.61 0.87 0.86  BUN/Creat Ratio 9 - 20 - - -  Sodium 135 - 145 mmol/L 138 140 138  Potassium 3.5 - 5.1 mmol/L 4.3 4.3 4.2  Chloride 98 - 111 mmol/L 106 103 102  CO2 22 - 32 mmol/L 24 27 27   Calcium 8.9 - 10.3 mg/dL 8.4(L) 9.0 8.7(L)   Hepatic Function Panel     Component Value Date/Time   PROT 6.0 06/20/2018 1159   ALBUMIN 4.1 06/20/2018 1159   AST 34 06/20/2018 1159   ALT 37 06/20/2018 1159   ALT 62 (H) 06/23/2016 1226   ALKPHOS 43 06/20/2018 1159   BILITOT 0.4 06/20/2018 1159   BILIDIR 0.12 04/16/2016 1601   IBILI NOT CALCULATED 03/26/2016 1845   CBC Latest Ref Rng & Units 09/13/2018 09/12/2018 09/11/2018  WBC 4.0 - 10.5 K/uL  20.2(H) 19.6(H) 26.1(H)  Hemoglobin 13.0 - 17.0 g/dL 13.9 13.8 14.7  Hematocrit 39.0 - 52.0 % 42.5 43.1 44.7  Platelets 150 - 400 K/uL 240 225 239       Assessment & Plan:  I personally reviewed all images and lab data in the [facility] system as well as any outside material available during this office visit and agree with the  radiology impressions.   COPD exacerbation ([place]) COPD with acute exacerbation of same  Significant mold exposure in the home as a driving factor, note patient is no longer smoking  Plan is for the patient continue Dulera 2 inhalations twice daily Incruse daily and begin repulse of prednisone 40 mg a day and after 5 days taper to 1 tablet daily and hold  No antibiotics are  indicated  Hypertension Hypertension under good control we will continue the current program of losartan   Elden was seen today for follow-up.  Diagnoses and all orders for this visit:  COPD exacerbation ([place])  Essential hypertension  Other orders -     predniSONE (DELTASONE) 10 MG tablet; Take 4 tablets daily for 5 days then one daily and stay

## 2019-03-07 ENCOUNTER — Other Ambulatory Visit (HOSPITAL_COMMUNITY): Payer: Self-pay

## 2019-03-07 NOTE — Progress Notes (Signed)
Paramedicine Encounter    Patient ID: James [name], male    DOB: [birth year], 59 y.o.   MRN: [id number]   Patient Care Team: James Stain, MD as PCP - General (Pulmonary Disease) O'Neal, Cassie Freer, MD as PCP - Cardiology (Cardiology)  Patient Active Problem List   Diagnosis Date Noted  . 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 Marijuana abuse 08/09/2018  . COPD exacerbation ([place]) 02/16/2018  . Hypertension 05/17/2016  . Low back pain radiating to both legs 04/18/2016  . History of tobacco abuse 04/06/2016  . Alcohol abuse 04/06/2016  . 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  albuterol (PROVENTIL HFA;VENTOLIN HFA) 108 (90 Base) MCG/ACT inhaler, Inhale 1-2 puffs into the lungs every 6 (six) hours as needed for wheezing or shortness of breath., Disp: 1 Inhaler, Rfl: 5 .  albuterol (PROVENTIL) (2.5 MG/3ML) 0.083% nebulizer solution, Take 3 mLs (2.5 mg total) by nebulization every 6 (six) hours as needed for wheezing or shortness of breath., Disp: 150 mL, Rfl: 1 .  folic acid (FOLVITE) 1 MG tablet, Take 1 tablet (1 mg total) by mouth daily., Disp: 90 tablet, Rfl: 1 .  Respiratory Therapy Supplies (FLUTTER) DEVI, Use 4 times daily, Disp: 1 each, Rfl: 0 .  Spacer/Aero-Holding Chambers (AEROCHAMBER MV) inhaler, Use as instructed, Disp: 1 each, Rfl: 0 .  thiamine (VITAMIN B-1) 100 MG tablet, Take 1 tablet (100 mg total) by mouth daily., Disp: 30 tablet, Rfl: 3 .  umeclidinium bromide (INCRUSE ELLIPTA) 62.5 MCG/INH AEPB, Inhale 1 puff into the lungs daily., Disp: 30 each, Rfl: 5 .  losartan (COZAAR) 100 MG tablet, TAKE 1 TABLET (100 MG TOTAL) BY MOUTH DAILY., Disp: 30 tablet, Rfl: 2 .  mometasone-formoterol (DULERA) 200-5 MCG/ACT AERO, Inhale 2 puffs into the lungs 2 (two) times daily., Disp: 13 g, Rfl: 2 .  predniSONE (DELTASONE) 10 MG tablet, Take 4 tablets daily for 5 days then one daily and stay, Disp: 60 tablet, Rfl: 1 .  traMADol (ULTRAM) 50 MG tablet, Take  1 tablet (50 mg total) by mouth every 8 (eight) hours as needed. (Patient not taking: Reported on 03/07/2019), Disp: 10 tablet, Rfl: 0 Allergies  Allergen Reactions  . Gabapentin Other (See Comments)    hallucinations      Social History   Socioeconomic History  . Marital status: Divorced    Spouse name: Not on file  . Number of children: Not on file  . Years of education: Not on file  . Highest education level: Not on file  Occupational History  . Occupation: Dealer  . Occupation: Painter  Tobacco Use  . Smoking status: Former Smoker    Packs/day: 1.50    Years: 40.00    Pack years: 60.00    Types: Cigarettes    Quit date: 2014    Years since quitting: 7.1  . Smokeless tobacco: Current User    Types: Chew  Substance and Sexual Activity  . Alcohol use: Yes    Alcohol/week: 28.0 standard drinks    Types: 28 Cans of beer per week    Comment: 4 beers a night; + jitters with not drinking, denies DTs or seizures  . Drug use: Yes    Types: Marijuana    Comment: daily; quit using crack and methamphetamine about 5 months ago   . Sexual activity: Not Currently    Partners: Female  Other Topics Concern  . Not on file  Social History Narrative   Lives  alone near [place] Determinants of Health   Financial Resource Strain: High Risk  . Difficulty of Paying Living Expenses: Hard  Food Insecurity: Unknown  . Worried About Charity fundraiser in the Last Year: Not on file  . Ran Out of Food in the Last Year: Never true  Transportation Needs: No Transportation Needs  . Lack of Transportation (Medical): No  . Lack of Transportation (Non-Medical): No  Physical Activity: Inactive  . Days of Exercise per Week: 0 days  . Minutes of Exercise per Session: 0 min  Stress: Stress Concern Present  . Feeling of Stress : To some extent  Social Connections: Slightly Isolated  . Frequency of Communication with Friends and Family: Three times a week  . Frequency of Social  Gatherings with Friends and Family: Once a week  . Attends Religious Services: 1 to 4 times per year  . Active Member of Clubs or Organizations: Yes  . Attends Archivist Meetings: 1 to 4 times per year  . Marital Status: Divorced  Human resources officer Violence:   . Fear of Current or Ex-Partner: Not on file  . Emotionally Abused: Not on file  . Physically Abused: Not on file  . Sexually Abused: Not on file    Physical Exam      Future Appointments  Date Time Provider [place]  05/01/2019  1:30 PM James Stain, MD [facility] None    BP (!) 142/90   Pulse 90   Resp 18   SpO2 98%   Pt reports he is doing about the same, he states he was given muscle relaxers at the ED a couple wks ago for pain and it made him feel very weird so he quit taking those.   Pt got approved for medicaid back dated to 09/2018. He did however miss the disability provider appointment-he went looking for the place but couldn't find it so he ended up just going back home.  So I will try to get that appointment resch for him.    James [name], [facility] [facility] Paramedic  03/07/19

## 2019-03-08 ENCOUNTER — Telehealth (HOSPITAL_COMMUNITY): Payer: Self-pay

## 2019-03-08 NOTE — Telephone Encounter (Signed)
Contacted the disability determination services for the missed medical exam appointment and she reports that they will reach out to him and also send out another letter so we will keep check on that as well.   [name], EMT-Paramedic  03/08/19

## 2019-03-20 MED FILL — LOSARTAN POTASSIUM 100 MG T: 100 | 30 days supply | Qty: 30 | Fill #2

## 2019-03-21 ENCOUNTER — Telehealth: Payer: Self-pay

## 2019-03-21 MED ORDER — SPIRIVA HANDIHALER 18 MCG IN CAPS: 18.0000 ug | ORAL_CAPSULE | Freq: Every day | RESPIRATORY_TRACT | 2 refills | Status: DC

## 2019-03-21 MED FILL — SPIRIVA 18 MCG CP-HANDIHALE: 18 | 30 days supply | Qty: 30 | Fill #0

## 2019-03-21 NOTE — Telephone Encounter (Signed)
Pt now has Medicaid ins and Incruse is not preferred.  If appropriate can we change therapy to Atrovent, Combivent or Spiriva.

## 2019-03-21 NOTE — Addendum Note (Signed)
Addended by: Asencion Noble E on: 03/21/2019 11:12 AM   Modules accepted: Orders

## 2019-03-21 NOTE — Telephone Encounter (Signed)
01:41pm 03/21/19 spoke to pt's sister-aware of change.

## 2019-03-21 NOTE — Telephone Encounter (Signed)
I will change incruse to spiriva  pls call the pt and let him know

## 2019-03-23 MED FILL — predniSONE 10 MG TABS: 10 | 30 days supply | Qty: 45 | Fill #1

## 2019-03-29 ENCOUNTER — Other Ambulatory Visit (HOSPITAL_COMMUNITY): Payer: Self-pay

## 2019-03-29 NOTE — Progress Notes (Signed)
Paramedicine Encounter    Patient ID: James [name], male    DOB: Mar 02, 1960, 59 y.o.   MRN: [id number]   Patient Care Team: [name], MD as PCP - General (Pulmonary Disease) O'Neal, Cassie Freer, MD as PCP - Cardiology (Cardiology)  Patient Active Problem List   Diagnosis Date Noted  . 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 Marijuana abuse 08/09/2018  . COPD exacerbation ([place]) 02/16/2018  . Hypertension 05/17/2016  . Low back pain radiating to both legs 04/18/2016  . History of tobacco abuse 04/06/2016  . Alcohol abuse 04/06/2016  . 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  albuterol (PROVENTIL HFA;VENTOLIN HFA) 108 (90 Base) MCG/ACT inhaler, Inhale 1-2 puffs into the lungs every 6 (six) hours as needed for wheezing or shortness of breath., Disp: 1 Inhaler, Rfl: 5 .  albuterol (PROVENTIL) (2.5 MG/3ML) 0.083% nebulizer solution, Take 3 mLs (2.5 mg total) by nebulization every 6 (six) hours as needed for wheezing or shortness of breath., Disp: 150 mL, Rfl: 1 .  folic acid (FOLVITE) 1 MG tablet, Take 1 tablet (1 mg total) by mouth daily., Disp: 90 tablet, Rfl: 1 .  losartan (COZAAR) 100 MG tablet, TAKE 1 TABLET (100 MG TOTAL) BY MOUTH DAILY., Disp: 30 tablet, Rfl: 2 .  mometasone-formoterol (DULERA) 200-5 MCG/ACT AERO, Inhale 2 puffs into the lungs 2 (two) times daily., Disp: 13 g, Rfl: 2 .  predniSONE (DELTASONE) 10 MG tablet, Take 4 tablets daily for 5 days then one daily and stay, Disp: 60 tablet, Rfl: 1 .  Respiratory Therapy Supplies (FLUTTER) DEVI, Use 4 times daily, Disp: 1 each, Rfl: 0 .  Spacer/Aero-Holding Chambers (AEROCHAMBER MV) inhaler, Use as instructed, Disp: 1 each, Rfl: 0 .  thiamine (VITAMIN B-1) 100 MG tablet, Take 1 tablet (100 mg total) by mouth daily., Disp: 30 tablet, Rfl: 3 .  tiotropium (SPIRIVA HANDIHALER) 18 MCG inhalation capsule, Place 1 capsule (18 mcg total) into inhaler and inhale daily., Disp: 30 capsule, Rfl: 2 .   traMADol (ULTRAM) 50 MG tablet, Take 1 tablet (50 mg total) by mouth every 8 (eight) hours as needed. (Patient not taking: Reported on 03/07/2019), Disp: 10 tablet, Rfl: 0 Allergies  Allergen Reactions  . Gabapentin Other (See Comments)    hallucinations      Social History   Socioeconomic History  . Marital status: Divorced    Spouse name: Not on file  . Number of children: Not on file  . Years of education: Not on file  . Highest education level: Not on file  Occupational History  . Occupation: Dealer  . Occupation: Painter  Tobacco Use  . Smoking status: Former Smoker    Packs/day: 1.50    Years: 40.00    Pack years: 60.00    Types: Cigarettes    Quit date: 2014    Years since quitting: 7.2  . Smokeless tobacco: Current User    Types: Chew  Substance and Sexual Activity  . Alcohol use: Yes    Alcohol/week: 28.0 standard drinks    Types: 28 Cans of beer per week    Comment: 4 beers a night; + jitters with not drinking, denies DTs or seizures  . Drug use: Yes    Types: Marijuana    Comment: daily; quit using crack and methamphetamine about 5 months ago   . Sexual activity: Not Currently    Partners: Female  Other Topics Concern  . Not on file  Social History  Narrative   Lives alone near [place]   Social Determinants of Health   Financial Resource Strain: High Risk  . Difficulty of Paying Living Expenses: Hard  Food Insecurity: Unknown  . Worried About Charity fundraiser in the Last Year: Not on file  . Ran Out of Food in the Last Year: Never true  Transportation Needs: No Transportation Needs  . Lack of Transportation (Medical): No  . Lack of Transportation (Non-Medical): No  Physical Activity: Inactive  . Days of Exercise per Week: 0 days  . Minutes of Exercise per Session: 0 min  Stress: Stress Concern Present  . Feeling of Stress : To some extent  Social Connections: Slightly Isolated  . Frequency of Communication with Friends and Family: Three  times a week  . Frequency of Social Gatherings with Friends and Family: Once a week  . Attends Religious Services: 1 to 4 times per year  . Active Member of Clubs or Organizations: Yes  . Attends Archivist Meetings: 1 to 4 times per year  . Marital Status: Divorced  Human resources officer Violence:   . Fear of Current or Ex-Partner:   . Emotionally Abused:   [name] Physically Abused:   . Sexually Abused:     Physical Exam      Future Appointments  Date Time Provider [place]  05/01/2019  1:30 PM [name], MD [facility] None    BP (!) 142/98   Pulse 94   Temp 98.4 F (36.9 C)   Resp 18   SpO2 98%   Pt reports doing ok, he had an episode a few nights ago when he felt very sob, used inhaler and it subsided.  No increased sob, no c/p, no dizziness. Lungs clear today.  His disability exam appointment got moved to 4/12 @ 930 but has to get there 52min early. I explained the directions to him. I advised I could meet him somewhere to ensure he got to the right place, but he said his sister would likely take him. Told him to call me if he gets lost or doesn't know where to go.  The back side of the house has got new flooring and carpet down now and it looks really great. He is very excited about that and very excited that I was able to look at it.  There was thick insulation that was put down under neath it as well. They are also going to be closing up the holes to the foundation on the house as well.  That was awesome progress from my last visit with him.     [name], [facility] Paramedic  03/29/19

## 2019-04-05 MED FILL — predniSONE 10 MG TABS: 10 | 20 days supply | Qty: 30 | Fill #2

## 2019-04-13 ENCOUNTER — Ambulatory Visit: Payer: Medicaid Other | Attending: Internal Medicine | Admitting: Internal Medicine

## 2019-04-13 ENCOUNTER — Other Ambulatory Visit: Payer: Self-pay

## 2019-04-13 ENCOUNTER — Encounter: Payer: Self-pay | Admitting: Internal Medicine

## 2019-04-13 VITALS — BP 103/71 | HR 108 | Temp 98.1°F | Resp 16 | Wt 177.6 lb

## 2019-04-13 DIAGNOSIS — Z79899 Other long term (current) drug therapy: Secondary | ICD-10-CM | POA: Insufficient documentation

## 2019-04-13 DIAGNOSIS — J449 Chronic obstructive pulmonary disease, unspecified: Secondary | ICD-10-CM

## 2019-04-13 DIAGNOSIS — F1010 Alcohol abuse, uncomplicated: Secondary | ICD-10-CM | POA: Diagnosis not present

## 2019-04-13 DIAGNOSIS — Z7952 Long term (current) use of systemic steroids: Secondary | ICD-10-CM | POA: Diagnosis not present

## 2019-04-13 DIAGNOSIS — Z87891 Personal history of nicotine dependence: Secondary | ICD-10-CM | POA: Insufficient documentation

## 2019-04-13 DIAGNOSIS — I10 Essential (primary) hypertension: Secondary | ICD-10-CM | POA: Diagnosis not present

## 2019-04-13 DIAGNOSIS — Z7951 Long term (current) use of inhaled steroids: Secondary | ICD-10-CM | POA: Diagnosis not present

## 2019-04-13 DIAGNOSIS — Z8249 Family history of ischemic heart disease and other diseases of the circulatory system: Secondary | ICD-10-CM | POA: Insufficient documentation

## 2019-04-13 DIAGNOSIS — F1210 Cannabis abuse, uncomplicated: Secondary | ICD-10-CM | POA: Diagnosis not present

## 2019-04-13 MED ORDER — PREDNISONE 10 MG PO TABS: ORAL_TABLET | ORAL | 0 refills | Status: DC

## 2019-04-13 MED FILL — predniSONE 10 MG TABS: 10 | 14 days supply | Qty: 42 | Fill #0

## 2019-04-13 NOTE — Progress Notes (Signed)
Patient ID: James [name], male    DOB: [birth year]  MRN: [id number]  CC: COPD   Subjective: James [name] is a 59 y.o. male who presents for UC visit requesting refill on prednisone. His concerns today include:  Patient with history of HTN, COPD, tobacco dependence  Pt needing RF on Prednisone.  Out x 2 days. Patient followed by [name].  He has COPD.  He is on Spiriva, Ruthe Mannan and uses his nebulizer machine at bedtime.  Prednisone was last prescribed in February as 10 mg tablets to take 4 tablets daily for 5 days then 1 tablet daily.  However patient has been running out of the prednisone much sooner than he is supposed to.  In talking with him and checking with the pharmacy to see when he had received the last 3 refills, it becomes apparent that patient is taking 4 tablets consistently every day which is 40 mg daily.  Last prescription was filled 04/05/2019 with 30 tablets. -He has chronic cough.  No fever.  He has had some increased shortness of breath since running out of the prednisone.  Patient Active Problem List   Diagnosis Date Noted  . 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 Marijuana abuse 08/09/2018  . COPD exacerbation ([place]) 02/16/2018  . Hypertension 05/17/2016  . Low back pain radiating to both legs 04/18/2016  . History of tobacco abuse 04/06/2016  . Alcohol abuse 04/06/2016  . 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 albuterol (PROVENTIL HFA;VENTOLIN HFA) 108 (90 Base) MCG/ACT inhaler Inhale 1-2 puffs into the lungs every 6 (six) hours as needed for wheezing or shortness of breath. 1 Inhaler 5  . albuterol (PROVENTIL) (2.5 MG/3ML) 0.083% nebulizer solution Take 3 mLs (2.5 mg total) by nebulization every 6 (six) hours as needed for wheezing or shortness of breath. 202 mL 1  . folic acid (FOLVITE) 1 MG tablet Take 1 tablet (1 mg total) by mouth daily. 90 tablet 1  . losartan (COZAAR) 100 MG  tablet TAKE 1 TABLET (100 MG TOTAL) BY MOUTH DAILY. 30 tablet 2  . mometasone-formoterol (DULERA) 200-5 MCG/ACT AERO Inhale 2 puffs into the lungs 2 (two) times daily. 13 g 2  . Respiratory Therapy Supplies (FLUTTER) DEVI Use 4 times daily 1 each 0  . Spacer/Aero-Holding Chambers (AEROCHAMBER MV) inhaler Use as instructed 1 each 0  . thiamine (VITAMIN B-1) 100 MG tablet Take 1 tablet (100 mg total) by mouth daily. 30 tablet 3  . tiotropium (SPIRIVA HANDIHALER) 18 MCG inhalation capsule Place 1 capsule (18 mcg total) into inhaler and inhale daily. 30 capsule 2  . traMADol (ULTRAM) 50 MG tablet Take 1 tablet (50 mg total) by mouth every 8 (eight) hours as needed. (Patient not taking: Reported on 03/07/2019) 10 tablet 0   No current facility-administered medications on file prior to visit.    Allergies  Allergen Reactions  . Gabapentin Other (See Comments)    hallucinations    Social History   Socioeconomic History  . Marital status: Divorced    Spouse name: Not on file  . Number of children: Not on file  . Years of education: Not on file  . Highest education level: Not on file  Occupational History  . Occupation: Dealer  . Occupation: Painter  Tobacco Use  . Smoking status: Former Smoker    Packs/day: 1.50    Years: 40.00  Pack years: 60.00    Types: Cigarettes    Quit date: 2014    Years since quitting: 7.2  . Smokeless tobacco: Current User    Types: Chew  Substance and Sexual Activity  . Alcohol use: Yes    Alcohol/week: 28.0 standard drinks    Types: 28 Cans of beer per week    Comment: 4 beers a night; + jitters with not drinking, denies DTs or seizures  . Drug use: Yes    Types: Marijuana    Comment: daily; quit using crack and methamphetamine about 5 months ago   . Sexual activity: Not Currently    Partners: Female  Other Topics Concern  . Not on file  Social History Narrative   Lives alone near [place]   Social Determinants of Health   Financial  Resource Strain: High Risk  . Difficulty of Paying Living Expenses: Hard  Food Insecurity: Unknown  . Worried About Charity fundraiser in the Last Year: Not on file  . Ran Out of Food in the Last Year: Never true  Transportation Needs: No Transportation Needs  . Lack of Transportation (Medical): No  . Lack of Transportation (Non-Medical): No  Physical Activity: Inactive  . Days of Exercise per Week: 0 days  . Minutes of Exercise per Session: 0 min  Stress: Stress Concern Present  . Feeling of Stress : To some extent  Social Connections: Slightly Isolated  . Frequency of Communication with Friends and Family: Three times a week  . Frequency of Social Gatherings with Friends and Family: Once a week  . Attends Religious Services: 1 to 4 times per year  . Active Member of Clubs or Organizations: Yes  . Attends Archivist Meetings: 1 to 4 times per year  . Marital Status: Divorced  Human resources officer Violence:   . Fear of Current or Ex-Partner:   . Emotionally Abused:   [name] Physically Abused:   . Sexually Abused:     Family History  Problem Relation Age of Onset  . Heart disease Father   . COPD Sister     Past Surgical History:  Procedure Laterality Date  . VIDEO ASSISTED THORACOSCOPY (VATS)/THOROCOTOMY Left 06/11/2013   Procedure: VIDEO ASSISTED THORACOSCOPY (VATS)/THOROCOTOMY;  Surgeon: Ivin Poot, MD;  Location: [place];  Service: Thoracic;  Laterality: Left;  [name] VIDEO BRONCHOSCOPY N/A 06/11/2013   Procedure: VIDEO BRONCHOSCOPY;  Surgeon: Ivin Poot, MD;  Location: [facility];  Service: Thoracic;  Laterality: N/A;  . VIDEO BRONCHOSCOPY N/A 08/10/2018   Procedure: VIDEO BRONCHOSCOPY WITH FLUORO;  Surgeon: Candee Furbish, MD;  Location: [name] ENDOSCOPY;  Service: Endoscopy;  Laterality: N/A;    ROS: Review of Systems Negative except as stated above  PHYSICAL EXAM: BP 103/71   Pulse (!) 108   Temp 98.1 F (36.7 C)   Resp 16   Wt 177 lb 9.6 oz (80.6 kg)   SpO2 96%    BMI 26.23 kg/m   Physical Exam  General appearance - alert, well appearing, and in no distress Mental status - normal mood, behavior, speech, dress, motor activity, and thought processes Chest -breath sounds mildly decreased with intermittent wheezing. Heart - normal rate, regular rhythm, normal S1, S2, no murmurs, rubs, clicks or gallops  CMP Latest Ref Rng & Units 09/12/2018 09/11/2018 08/11/2018  Glucose 70 - 99 mg/dL 121(H) 139(H) 195(H)  BUN 6 - 20 mg/dL 18 18 16   Creatinine 0.61 - 1.24 mg/dL 0.61 0.87 0.86  Sodium 135 -  145 mmol/L 138 140 138  Potassium 3.5 - 5.1 mmol/L 4.3 4.3 4.2  Chloride 98 - 111 mmol/L 106 103 102  CO2 22 - 32 mmol/L 24 27 27   Calcium 8.9 - 10.3 mg/dL 8.4(L) 9.0 8.7(L)  Total Protein 6.0 - 8.5 g/dL - - -  Total Bilirubin 0.0 - 1.2 mg/dL - - -  Alkaline Phos 39 - 117 IU/L - - -  AST 0 - 40 IU/L - - -  ALT 0 - 44 IU/L - - -   Lipid Panel     Component Value Date/Time   TRIG 52 05/17/2018 0902    CBC    Component Value Date/Time   WBC 20.2 (H) 09/13/2018 0516   RBC 4.23 09/13/2018 0516   HGB 13.9 09/13/2018 0516   HGB 14.6 06/20/2018 1159   HCT 42.5 09/13/2018 0516   HCT 41.5 06/20/2018 1159   PLT 240 09/13/2018 0516   PLT 281 06/20/2018 1159   MCV 100.5 (H) 09/13/2018 0516   MCV 94 06/20/2018 1159   MCH 32.9 09/13/2018 0516   MCHC 32.7 09/13/2018 0516   RDW 12.8 09/13/2018 0516   RDW 11.6 06/20/2018 1159   LYMPHSABS 1.7 09/11/2018 1100   LYMPHSABS 2.4 03/14/2018 0938   MONOABS 1.3 (H) 09/11/2018 1100   EOSABS 0.1 09/11/2018 1100   EOSABS 0.2 03/14/2018 0938   BASOSABS 0.0 09/11/2018 1100   BASOSABS 0.0 03/14/2018 0938    ASSESSMENT AND PLAN: 1. 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. -I will write prescription today  for him to take 30 mg daily for the next 2 weeks.  After that subsequent prescription should be for 20 mg daily for 2 weeks.  He will keep his follow-up appointment with [name] later this month.  Message sent to [name]. - predniSONE (DELTASONE) 10 MG tablet; Take 3 tablets daily PO x 14 days.  Dispense: 42 tablet; Refill: 0    Patient was given the opportunity to ask questions.  Patient verbalized understanding of the plan and was able to repeat key elements of the plan.   No orders of the defined types were placed in this encounter.    Requested Prescriptions   Signed Prescriptions Disp Refills  . predniSONE (DELTASONE) 10 MG tablet 42 tablet 0    Sig: Take 3 tablets daily PO x 14 days.    No follow-ups on file.  Karle Plumber, MD, FACP

## 2019-04-13 NOTE — Patient Instructions (Signed)
Take 3 tablets of prednisone daily. After you finish the current bottle, the prednisone will be decreased to 2 tablets daily. Please keep your follow-up appointment with [name] later this month.

## 2019-04-13 NOTE — Progress Notes (Signed)
Pt is requesting a refill on prednisone

## 2019-04-19 DIAGNOSIS — J441 Chronic obstructive pulmonary disease with (acute) exacerbation: Secondary | ICD-10-CM | POA: Diagnosis not present

## 2019-04-23 ENCOUNTER — Emergency Department (HOSPITAL_COMMUNITY): Payer: Medicaid Other

## 2019-04-23 ENCOUNTER — Encounter (HOSPITAL_COMMUNITY): Payer: Self-pay | Admitting: Emergency Medicine

## 2019-04-23 ENCOUNTER — Inpatient Hospital Stay (HOSPITAL_COMMUNITY)
Admission: EM | Admit: 2019-04-23 | Discharge: 2019-04-27 | DRG: 193 | Disposition: A | Discharge status: Home / Self Care | Payer: Medicaid Other | Attending: Internal Medicine | Admitting: Internal Medicine

## 2019-04-23 DIAGNOSIS — I10 Essential (primary) hypertension: Secondary | ICD-10-CM | POA: Diagnosis not present

## 2019-04-23 DIAGNOSIS — Z20822 Contact with and (suspected) exposure to covid-19: Secondary | ICD-10-CM | POA: Diagnosis present

## 2019-04-23 DIAGNOSIS — J80 Acute respiratory distress syndrome: Secondary | ICD-10-CM | POA: Diagnosis not present

## 2019-04-23 DIAGNOSIS — F1010 Alcohol abuse, uncomplicated: Secondary | ICD-10-CM | POA: Diagnosis present

## 2019-04-23 DIAGNOSIS — J9622 Acute and chronic respiratory failure with hypercapnia: Secondary | ICD-10-CM

## 2019-04-23 DIAGNOSIS — J441 Chronic obstructive pulmonary disease with (acute) exacerbation: Secondary | ICD-10-CM | POA: Diagnosis not present

## 2019-04-23 DIAGNOSIS — J449 Chronic obstructive pulmonary disease, unspecified: Secondary | ICD-10-CM

## 2019-04-23 DIAGNOSIS — J9611 Chronic respiratory failure with hypoxia: Secondary | ICD-10-CM | POA: Diagnosis present

## 2019-04-23 DIAGNOSIS — R918 Other nonspecific abnormal finding of lung field: Secondary | ICD-10-CM | POA: Diagnosis not present

## 2019-04-23 DIAGNOSIS — J9601 Acute respiratory failure with hypoxia: Secondary | ICD-10-CM

## 2019-04-23 DIAGNOSIS — R0602 Shortness of breath: Secondary | ICD-10-CM | POA: Diagnosis not present

## 2019-04-23 DIAGNOSIS — J440 Chronic obstructive pulmonary disease with acute lower respiratory infection: Secondary | ICD-10-CM | POA: Diagnosis present

## 2019-04-23 DIAGNOSIS — Z87891 Personal history of nicotine dependence: Secondary | ICD-10-CM

## 2019-04-23 DIAGNOSIS — Z79899 Other long term (current) drug therapy: Secondary | ICD-10-CM | POA: Diagnosis not present

## 2019-04-23 DIAGNOSIS — F79 Unspecified intellectual disabilities: Secondary | ICD-10-CM | POA: Diagnosis not present

## 2019-04-23 DIAGNOSIS — J189 Pneumonia, unspecified organism: Secondary | ICD-10-CM | POA: Diagnosis not present

## 2019-04-23 DIAGNOSIS — J9620 Acute and chronic respiratory failure, unspecified whether with hypoxia or hypercapnia: Secondary | ICD-10-CM | POA: Diagnosis present

## 2019-04-23 DIAGNOSIS — R000 Tachycardia, unspecified: Secondary | ICD-10-CM | POA: Diagnosis not present

## 2019-04-23 DIAGNOSIS — J9621 Acute and chronic respiratory failure with hypoxia: Secondary | ICD-10-CM | POA: Diagnosis present

## 2019-04-23 DIAGNOSIS — Z7951 Long term (current) use of inhaled steroids: Secondary | ICD-10-CM

## 2019-04-23 DIAGNOSIS — R0689 Other abnormalities of breathing: Secondary | ICD-10-CM | POA: Diagnosis not present

## 2019-04-23 DIAGNOSIS — R52 Pain, unspecified: Secondary | ICD-10-CM | POA: Diagnosis not present

## 2019-04-23 DIAGNOSIS — F1210 Cannabis abuse, uncomplicated: Secondary | ICD-10-CM | POA: Diagnosis present

## 2019-04-23 LAB — POCT I-STAT 7, (LYTES, BLD GAS, ICA,H+H)
Acid-Base Excess: 2 mmol/L (ref 0.0–2.0)
Bicarbonate: 28.6 mmol/L — ABNORMAL HIGH (ref 20.0–28.0)
Calcium, Ion: 1.3 mmol/L (ref 1.15–1.40)
HCT: 43 % (ref 39.0–52.0)
Hemoglobin: 14.6 g/dL (ref 13.0–17.0)
O2 Saturation: 99 %
Patient temperature: 98.6
Potassium: 4.1 mmol/L (ref 3.5–5.1)
Sodium: 139 mmol/L (ref 135–145)
TCO2: 30 mmol/L (ref 22–32)
pCO2 arterial: 52.8 mmHg — ABNORMAL HIGH (ref 32.0–48.0)
pH, Arterial: 7.341 — ABNORMAL LOW (ref 7.350–7.450)
pO2, Arterial: 140 mmHg — ABNORMAL HIGH (ref 83.0–108.0)

## 2019-04-23 LAB — HEPATIC FUNCTION PANEL
ALT: 26 U/L (ref 0–44)
AST: 28 U/L (ref 15–41)
Albumin: 3.4 g/dL — ABNORMAL LOW (ref 3.5–5.0)
Alkaline Phosphatase: 34 U/L — ABNORMAL LOW (ref 38–126)
Bilirubin, Direct: 0.2 mg/dL (ref 0.0–0.2)
Indirect Bilirubin: 0.3 mg/dL (ref 0.3–0.9)
Total Bilirubin: 0.5 mg/dL (ref 0.3–1.2)
Total Protein: 5.9 g/dL — ABNORMAL LOW (ref 6.5–8.1)

## 2019-04-23 LAB — BRAIN NATRIURETIC PEPTIDE: B Natriuretic Peptide: 58.3 pg/mL (ref 0.0–100.0)

## 2019-04-23 LAB — POC SARS CORONAVIRUS 2 AG -  ED: SARS Coronavirus 2 Ag: NEGATIVE

## 2019-04-23 LAB — CBC WITH DIFFERENTIAL/PLATELET
Abs Immature Granulocytes: 0.04 10*3/uL (ref 0.00–0.07)
Basophils Absolute: 0.1 10*3/uL (ref 0.0–0.1)
Basophils Relative: 1 %
Eosinophils Absolute: 0.1 10*3/uL (ref 0.0–0.5)
Eosinophils Relative: 1 %
HCT: 48.3 % (ref 39.0–52.0)
Hemoglobin: 15.7 g/dL (ref 13.0–17.0)
Immature Granulocytes: 0 %
Lymphocytes Relative: 30 %
Lymphs Abs: 3.5 10*3/uL (ref 0.7–4.0)
MCH: 32.8 pg (ref 26.0–34.0)
MCHC: 32.5 g/dL (ref 30.0–36.0)
MCV: 101 fL — ABNORMAL HIGH (ref 80.0–100.0)
Monocytes Absolute: 0.9 10*3/uL (ref 0.1–1.0)
Monocytes Relative: 8 %
Neutro Abs: 7.1 10*3/uL (ref 1.7–7.7)
Neutrophils Relative %: 60 %
Platelets: 325 10*3/uL (ref 150–400)
RBC: 4.78 MIL/uL (ref 4.22–5.81)
RDW: 12.5 % (ref 11.5–15.5)
WBC: 11.8 10*3/uL — ABNORMAL HIGH (ref 4.0–10.5)
nRBC: 0 % (ref 0.0–0.2)

## 2019-04-23 LAB — LACTIC ACID, PLASMA
Lactic Acid, Venous: 3 mmol/L (ref 0.5–1.9)
Lactic Acid, Venous: 2.5 mmol/L (ref 0.5–1.9)
Lactic Acid, Venous: 3.1 mmol/L (ref 0.5–1.9)
Lactic Acid, Venous: 3 mmol/L (ref 0.5–1.9)
Lactic Acid, Venous: 1.9 mmol/L (ref 0.5–1.9)
Lactic Acid, Venous: 1.8 mmol/L (ref 0.5–1.9)

## 2019-04-23 LAB — BASIC METABOLIC PANEL
Anion gap: 18 — ABNORMAL HIGH (ref 5–15)
BUN: 13 mg/dL (ref 6–20)
CO2: 25 mmol/L (ref 22–32)
Calcium: 9.7 mg/dL (ref 8.9–10.3)
Chloride: 101 mmol/L (ref 98–111)
Creatinine, Ser: 0.89 mg/dL (ref 0.61–1.24)
GFR calc Af Amer: >60 mL/min (ref >60)
GFR calc non Af Amer: >60 mL/min (ref >60)
Glucose, Bld: 145 mg/dL — ABNORMAL HIGH (ref 70–99)
Potassium: 4.1 mmol/L (ref 3.5–5.1)
Sodium: 144 mmol/L (ref 135–145)

## 2019-04-23 LAB — RESPIRATORY PANEL BY RT PCR (FLU A&B, COVID)
Influenza A by PCR: NEGATIVE
Influenza B by PCR: NEGATIVE
SARS Coronavirus 2 by RT PCR: NEGATIVE

## 2019-04-23 LAB — RAPID URINE DRUG SCREEN, HOSP PERFORMED
Amphetamines: NOT DETECTED
Barbiturates: NOT DETECTED
Benzodiazepines: NOT DETECTED
Cocaine: NOT DETECTED
Opiates: NOT DETECTED
Tetrahydrocannabinol: NOT DETECTED

## 2019-04-23 LAB — HIV ANTIBODY (ROUTINE TESTING W REFLEX): HIV Screen 4th Generation wRfx: NONREACTIVE

## 2019-04-23 LAB — PROCALCITONIN: Procalcitonin: 0.29 ng/mL

## 2019-04-23 LAB — ETHANOL: Alcohol, Ethyl (B): 18 mg/dL — ABNORMAL HIGH (ref <10)

## 2019-04-23 MED ORDER — SODIUM CHLORIDE 0.9 % IV BOLUS
500.0000 mL | Freq: Once | INTRAVENOUS | Status: AC
  Administered 2019-04-23: 500 mL via INTRAVENOUS

## 2019-04-23 MED ORDER — ONDANSETRON HCL 4 MG PO TABS: 4.0000 mg | ORAL_TABLET | Freq: Four times a day (QID) | ORAL | Status: DC | PRN

## 2019-04-23 MED ORDER — METHYLPREDNISOLONE SODIUM SUCC 125 MG IJ SOLR
80.0000 mg | Freq: Two times a day (BID) | INTRAMUSCULAR | Status: AC
  Administered 2019-04-23 (×2): 80 mg via INTRAVENOUS
  Filled 2019-04-23 (×2): qty 2

## 2019-04-23 MED ORDER — THIAMINE HCL 100 MG PO TABS
100.0000 mg | ORAL_TABLET | Freq: Every day | ORAL | Status: DC
  Administered 2019-04-25 – 2019-04-27 (×3): 100 mg via ORAL
  Filled 2019-04-23 (×4): qty 1

## 2019-04-23 MED ORDER — LORAZEPAM 2 MG/ML IJ SOLN
1.0000 mg | INTRAMUSCULAR | Status: AC | PRN
  Administered 2019-04-23 – 2019-04-24 (×4): 2 mg via INTRAVENOUS
  Filled 2019-04-23 (×4): qty 1

## 2019-04-23 MED ORDER — SODIUM CHLORIDE 0.9 % IV SOLN: INTRAVENOUS | Status: DC

## 2019-04-23 MED ORDER — SODIUM CHLORIDE 0.9 % IV SOLN
2.0000 g | INTRAVENOUS | Status: DC
  Administered 2019-04-23 – 2019-04-27 (×5): 2 g via INTRAVENOUS
  Filled 2019-04-23 (×5): qty 20

## 2019-04-23 MED ORDER — MORPHINE SULFATE (PF) 2 MG/ML IV SOLN: 2.0000 mg | INTRAVENOUS | Status: DC | PRN

## 2019-04-23 MED ORDER — ALBUTEROL SULFATE (2.5 MG/3ML) 0.083% IN NEBU
10.0000 mg | INHALATION_SOLUTION | Freq: Once | RESPIRATORY_TRACT | Status: AC
  Administered 2019-04-23: 10 mg via RESPIRATORY_TRACT
  Filled 2019-04-23: qty 12

## 2019-04-23 MED ORDER — SODIUM CHLORIDE 0.9 % IV SOLN
500.0000 mg | INTRAVENOUS | Status: DC
  Administered 2019-04-23 – 2019-04-26 (×4): 500 mg via INTRAVENOUS
  Filled 2019-04-23 (×6): qty 500

## 2019-04-23 MED ORDER — IPRATROPIUM-ALBUTEROL 0.5-2.5 (3) MG/3ML IN SOLN
3.0000 mL | Freq: Four times a day (QID) | RESPIRATORY_TRACT | Status: DC
  Administered 2019-04-24: 3 mL via RESPIRATORY_TRACT
  Filled 2019-04-23: qty 3

## 2019-04-23 MED ORDER — POLYETHYLENE GLYCOL 3350 17 G PO PACK: 17.0000 g | PACK | Freq: Every day | ORAL | Status: DC | PRN

## 2019-04-23 MED ORDER — ALBUTEROL SULFATE (2.5 MG/3ML) 0.083% IN NEBU
2.5000 mg | INHALATION_SOLUTION | RESPIRATORY_TRACT | Status: DC | PRN
  Filled 2019-04-23: qty 3

## 2019-04-23 MED ORDER — HYDROCODONE-ACETAMINOPHEN 5-325 MG PO TABS: 1.0000 | ORAL_TABLET | ORAL | Status: DC | PRN

## 2019-04-23 MED ORDER — DIAZEPAM 5 MG/ML IJ SOLN
2.5000 mg | Freq: Four times a day (QID) | INTRAMUSCULAR | Status: DC
  Administered 2019-04-23 – 2019-04-27 (×17): 2.5 mg via INTRAVENOUS
  Filled 2019-04-23 (×17): qty 2

## 2019-04-23 MED ORDER — BISACODYL 5 MG PO TBEC: 5.0000 mg | DELAYED_RELEASE_TABLET | Freq: Every day | ORAL | Status: DC | PRN

## 2019-04-23 MED ORDER — DOCUSATE SODIUM 100 MG PO CAPS
100.0000 mg | ORAL_CAPSULE | Freq: Two times a day (BID) | ORAL | Status: DC
  Administered 2019-04-24 – 2019-04-27 (×7): 100 mg via ORAL
  Filled 2019-04-23 (×8): qty 1

## 2019-04-23 MED ORDER — IPRATROPIUM-ALBUTEROL 0.5-2.5 (3) MG/3ML IN SOLN
3.0000 mL | RESPIRATORY_TRACT | Status: DC
  Administered 2019-04-23 (×3): 3 mL via RESPIRATORY_TRACT
  Filled 2019-04-23 (×3): qty 3

## 2019-04-23 MED ORDER — ENOXAPARIN SODIUM 40 MG/0.4ML ~~LOC~~ SOLN
40.0000 mg | SUBCUTANEOUS | Status: DC
  Administered 2019-04-23 – 2019-04-27 (×5): 40 mg via SUBCUTANEOUS
  Filled 2019-04-23 (×5): qty 0.4

## 2019-04-23 MED ORDER — ACETAMINOPHEN 325 MG PO TABS
650.0000 mg | ORAL_TABLET | Freq: Four times a day (QID) | ORAL | Status: DC | PRN
  Filled 2019-04-23: qty 2

## 2019-04-23 MED ORDER — ADULT MULTIVITAMIN W/MINERALS CH
1.0000 | ORAL_TABLET | Freq: Every day | ORAL | Status: DC
  Administered 2019-04-24 – 2019-04-27 (×4): 1 via ORAL
  Filled 2019-04-23 (×4): qty 1

## 2019-04-23 MED ORDER — ONDANSETRON HCL 4 MG/2ML IJ SOLN: 4.0000 mg | Freq: Four times a day (QID) | INTRAMUSCULAR | Status: DC | PRN

## 2019-04-23 MED ORDER — HYDRALAZINE HCL 20 MG/ML IJ SOLN: 5.0000 mg | INTRAMUSCULAR | Status: DC | PRN

## 2019-04-23 MED ORDER — SODIUM CHLORIDE 0.9% FLUSH
3.0000 mL | Freq: Two times a day (BID) | INTRAVENOUS | Status: DC
  Administered 2019-04-23 – 2019-04-27 (×9): 3 mL via INTRAVENOUS

## 2019-04-23 MED ORDER — ALBUTEROL (5 MG/ML) CONTINUOUS INHALATION SOLN: 10.0000 mg/h | INHALATION_SOLUTION | Freq: Once | RESPIRATORY_TRACT | Status: DC

## 2019-04-23 MED ORDER — PREDNISONE 20 MG PO TABS: 40.0000 mg | ORAL_TABLET | Freq: Every day | ORAL | Status: DC

## 2019-04-23 MED ORDER — ACETAMINOPHEN 650 MG RE SUPP: 650.0000 mg | Freq: Four times a day (QID) | RECTAL | Status: DC | PRN

## 2019-04-23 MED ORDER — FOLIC ACID 1 MG PO TABS
1.0000 mg | ORAL_TABLET | Freq: Every day | ORAL | Status: DC
  Administered 2019-04-24 – 2019-04-27 (×4): 1 mg via ORAL
  Filled 2019-04-23 (×4): qty 1

## 2019-04-23 MED ORDER — LORAZEPAM 1 MG PO TABS: 1.0000 mg | ORAL_TABLET | ORAL | Status: AC | PRN

## 2019-04-23 MED ORDER — THIAMINE HCL 100 MG/ML IJ SOLN
100.0000 mg | Freq: Every day | INTRAMUSCULAR | Status: DC
  Administered 2019-04-23 – 2019-04-24 (×2): 100 mg via INTRAVENOUS
  Filled 2019-04-23 (×2): qty 2

## 2019-04-23 NOTE — ED Provider Notes (Signed)
[facility] EMERGENCY DEPARTMENT Provider Note   CSN: [id number] Arrival date & time: 04/23/19  0414     History Chief Complaint  Patient presents with  . Shortness of Breath  Level 5 caveat due to respiratory distress  James [name] is a 59 y.o. male.  The history is provided by the patient and the EMS personnel.  Shortness of Breath Severity:  Severe Onset quality:  Sudden Timing:  Constant Progression:  Worsening Chronicity:  New Relieved by:  Nothing Worsened by:  Nothing Associated symptoms: cough and wheezing   Associated symptoms: no chest pain    Patient with history of COPD, hypertension presents with shortness of breath.  Reports sudden onset of coughing and shortness of breath.  This feels similar to prior COPD exacerbations.  EMS reports on their arrival patient was hypoxic to the 70s.  Patient received a DuoNeb, magnesium, Solu-Medrol His oxygenation has improved    Past Medical History:  Diagnosis Date  . Acute on chronic respiratory failure with hypoxia ([place]) 06/08/2013  . CAP (community acquired pneumonia) 05/17/2018   See admit 05/17/18 ? rml  with   covid pcr neg - rx augmentin > f/u cxr in 4-6 weeks is fine unless condition declines   . COPD (chronic obstructive pulmonary disease) ([place])    not on home  . Dyspnea   . Hypertension   . Pneumonia 04/06/2016  . Pneumothorax    2016, fell from ladder  . 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 Tick bite 06/03/2018    Patient Active Problem List   Diagnosis Date Noted  . 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 Marijuana abuse 08/09/2018  . COPD exacerbation ([place]) 02/16/2018  . Hypertension 05/17/2016  . Low back pain radiating to both legs 04/18/2016  . History of tobacco abuse 04/06/2016  . Alcohol abuse 04/06/2016  . COPD mixed type ([place]) 03/26/2016    Past Surgical History:  Procedure Laterality Date  . VIDEO ASSISTED THORACOSCOPY (VATS)/THOROCOTOMY Left 06/11/2013   Procedure: VIDEO  ASSISTED THORACOSCOPY (VATS)/THOROCOTOMY;  Surgeon: Ivin Poot, MD;  Location: [facility];  Service: Thoracic;  Laterality: Left;  [name] VIDEO BRONCHOSCOPY N/A 06/11/2013   Procedure: VIDEO BRONCHOSCOPY;  Surgeon: Ivin Poot, MD;  Location: [facility];  Service: Thoracic;  Laterality: N/A;  . VIDEO BRONCHOSCOPY N/A 08/10/2018   Procedure: VIDEO BRONCHOSCOPY WITH FLUORO;  Surgeon: Candee Furbish, MD;  Location: [facility] ENDOSCOPY;  Service: Endoscopy;  Laterality: N/A;       Family History  Problem Relation Age of Onset  . Heart disease Father   . COPD Sister     Social History   Tobacco Use  . Smoking status: Former Smoker    Packs/day: 1.50    Years: 40.00    Pack years: 60.00    Types: Cigarettes    Quit date: 2014    Years since quitting: 7.3  . Smokeless tobacco: Current User    Types: Chew  Substance Use Topics  . Alcohol use: Yes    Alcohol/week: 28.0 standard drinks    Types: 28 Cans of beer per week    Comment: 4 beers a night; + jitters with not drinking, denies DTs or seizures  . Drug use: Yes    Types: Marijuana    Comment: daily; quit using crack and methamphetamine about 5 months ago     Home Medications Prior to Admission medications   Medication Sig Start Date End Date Taking? Authorizing Provider  albuterol (PROVENTIL HFA;VENTOLIN HFA) 108 (  90 Base) MCG/ACT inhaler Inhale 1-2 puffs into the lungs every 6 (six) hours as needed for wheezing or shortness of breath. 03/29/18   Scot Jun, FNP  albuterol (PROVENTIL) (2.5 MG/3ML) 0.083% nebulizer solution Take 3 mLs (2.5 mg total) by nebulization every 6 (six) hours as needed for wheezing or shortness of breath. 06/20/18   Scot Jun, FNP  folic acid (FOLVITE) 1 MG tablet Take 1 tablet (1 mg total) by mouth daily. 09/28/18   [name], MD  losartan (COZAAR) 100 MG tablet TAKE 1 TABLET (100 MG TOTAL) BY MOUTH DAILY. 01/12/19   [name], MD  mometasone-formoterol (DULERA) 200-5 MCG/ACT AERO Inhale 2  puffs into the lungs 2 (two) times daily. 07/18/18   Scot Jun, FNP  predniSONE (DELTASONE) 10 MG tablet Take 3 tablets daily PO x 14 days. 04/13/19   Ladell Pier, MD  Respiratory Therapy Supplies (FLUTTER) DEVI Use 4 times daily 04/27/18   [name], MD  Spacer/Aero-Holding Chambers (AEROCHAMBER MV) inhaler Use as instructed 08/03/18   [name], MD  thiamine (VITAMIN B-1) 100 MG tablet Take 1 tablet (100 mg total) by mouth daily. 08/03/18   [name], MD  tiotropium (SPIRIVA HANDIHALER) 18 MCG inhalation capsule Place 1 capsule (18 mcg total) into inhaler and inhale daily. 03/21/19 06/19/19  [name], MD  traMADol (ULTRAM) 50 MG tablet Take 1 tablet (50 mg total) by mouth every 8 (eight) hours as needed. Patient not taking: Reported on 03/07/2019 01/16/19   Jaynee Eagles, PA-C    Allergies    Gabapentin  Review of Systems   Review of Systems  Unable to perform ROS: Severe respiratory distress  Respiratory: Positive for cough, shortness of breath and wheezing.   Cardiovascular: Negative for chest pain.    Physical Exam Updated Vital Signs BP (!) 163/105 (BP Location: Right Arm) Comment: Simultaneous filing. User may not have seen previous data.  Pulse (!) 140 Comment: Simultaneous filing. User may not have seen previous data.  Temp 98.2 F (36.8 C) (Temporal) Comment: Simultaneous filing. User may not have seen previous data.  Resp (!) 21 Comment: Simultaneous filing. User may not have seen previous data.  SpO2 96% Comment: Simultaneous filing. User may not have seen previous data.  Physical Exam CONSTITUTIONAL: Ill-appearing, appears older than stated age HEAD: Normocephalic/atraumatic EYES: EOMI/PERRL ENMT: Mucous membranes moist, nonrebreather mask in place NECK: supple no meningeal signs SPINE/BACK:entire spine nontender CV: Tachycardic LUNGS: Tachypnea, wheezing bilaterally, distress noted ABDOMEN: soft, nontender, no rebound or  guarding, bowel sounds noted throughout abdomen GU:no cva tenderness NEURO: Pt is awake/alert/appropriate, moves all extremitiesx4.  No facial droop.   EXTREMITIES: pulses normal/equal, full ROM, no lower extremity edema SKIN: warm, color normal PSYCH: Anxious ED Results / Procedures / Treatments   Labs (all labs ordered are listed, but only abnormal results are displayed) Labs Reviewed  BASIC METABOLIC PANEL - Abnormal; Notable for the following components:      Result Value   Glucose, Bld 145 (*)    Anion gap 18 (*)    All other components within normal limits  CBC WITH DIFFERENTIAL/PLATELET - Abnormal; Notable for the following components:   WBC 11.8 (*)    MCV 101.0 (*)    All other components within normal limits  ETHANOL - Abnormal; Notable for the following components:   Alcohol, Ethyl (B) 18 (*)    All other components within normal limits  LACTIC ACID, PLASMA - Abnormal; Notable  for the following components:   Lactic Acid, Venous 3.0 (*)    All other components within normal limits  RESPIRATORY PANEL BY RT PCR (FLU A&B, COVID)  CULTURE, BLOOD (ROUTINE X 2)  CULTURE, BLOOD (ROUTINE X 2)  BRAIN NATRIURETIC PEPTIDE  LACTIC ACID, PLASMA  POC SARS CORONAVIRUS 2 AG -  ED    EKG EKG Interpretation  Date/Time:  Monday April 23 2019 04:17:03 EDT Ventricular Rate:  129 PR Interval:    QRS Duration: 95 QT Interval:  303 QTC Calculation: 444 R Axis:   80 Text Interpretation: Sinus tachycardia Multiple premature complexes, vent & supraven Anterior infarct, old Interpretation limited secondary to artifact Confirmed by Ripley Fraise ([id number]) on 04/23/2019 4:28:12 AM   Radiology DG Chest Port 1 View  Result Date: 04/23/2019 CLINICAL DATA:  59 year old male with cough and shortness of breath. EXAM: PORTABLE CHEST 1 VIEW COMPARISON:  Chest radiographs 11/09/2018 and earlier. FINDINGS: Portable AP upright view at 0428 hours. Recurrent right lower lung airspace opacity  appearing similar to portable chest radiographs and chest CT 08/09/2018. Underlying chronic pulmonary hyperinflation and interstitial markings. No pleural effusion is evident. Mediastinal contours remain normal. Numerous chronic bilateral rib fractures. No acute osseous abnormality identified. IMPRESSION: 1. Recurrent right lower lung airspace opacity compatible with pneumonia. No pleural effusion. 2. Underlying chronic lung disease. 3. Followup PA and lateral chest X-ray is recommended in 3-4 weeks following trial of antibiotic therapy to ensure resolution and exclude underlying malignancy. Electronically Signed   By: Genevie Ann M.D.   On: 04/23/2019 04:49    Procedures .Critical Care Performed by: Ripley Fraise, MD Authorized by: Ripley Fraise, MD   Critical care provider statement:    Critical care time (minutes):  50   Critical care start time:  04/23/2019 4:30 AM   Critical care end time:  04/23/2019 5:20 AM   Critical care time was exclusive of:  Separately billable procedures and treating other patients   Critical care was necessary to treat or prevent imminent or life-threatening deterioration of the following conditions:  Respiratory failure   Critical care was time spent personally by me on the following activities:  Review of old charts, re-evaluation of patient's condition, pulse oximetry, ordering and review of radiographic studies, ordering and review of laboratory studies, examination of patient, evaluation of patient's response to treatment and development of treatment plan with patient or surrogate   I assumed direction of critical care for this patient from another provider in my specialty: no        Medications Ordered in ED Medications  cefTRIAXone (ROCEPHIN) 2 g in sodium chloride 0.9 % 100 mL IVPB (0 g Intravenous Stopped 04/23/19 0633)  azithromycin (ZITHROMAX) 500 mg in sodium chloride 0.9 % 250 mL IVPB (500 mg Intravenous New Bag/Given 04/23/19 0538)  albuterol  (PROVENTIL) (2.5 MG/3ML) 0.083% nebulizer solution 10 mg (10 mg Nebulization Given 04/23/19 0518)    ED Course  I have reviewed the triage vital signs and the nursing notes.  Pertinent labs & imaging results that were available during my care of the patient were reviewed by me and considered in my medical decision making (see chart for details).    MDM Rules/Calculators/A&P                      4:34 AM Patient seen on arrival for respiratory distress.  His symptoms are somewhat improving from medicines given prior to arrival.  He is able to speak to me now.  Will follow closely, may require noninvasive ventilation 5:11 AM Patient was placed on BiPAP and is improving.  Is also been given albuterol.  Patient found to have recurrent right lower lobe pneumonia, IV antibiotics been ordered, patient will need to be admitted 6:21 AM The patient is noted to have a lactate >=2. With the current information available to me, I do not think the elevated lactate is directly related to Sepsis. Elevated lactate is due to intense albuterol use. Overall patient is improving with noninvasive ventilation.  He will be admitted for COPD exacerbation and pneumonia. 7:21 AM D/w dr Lorin Mercy for admission    This patient presents to the ED for concern of COPD/shortness of breath, this involves an extensive number of treatment options, and is a complaint that carries with it a high risk of complications and morbidity.  The differential diagnosis includes pulmonary embolism, COPD exacerbation, CHF exacerbation, pneumonia   Lab Tests:   I Ordered, reviewed, and interpreted labs, which included Covid testing, electrolyte panel, complete blood count, BNP testing   Medicines ordered:   I ordered medication albuterol and antibiotic for for wheezing and pneumonia  Imaging Studies ordered:   I ordered imaging studies which included chest x-ray   I independently visualized and interpreted imaging which showed  Monia  Additional history obtained:   Additional history obtained from paramedics  Previous records obtained and reviewed   Consultations Obtained:   I consulted dr Lorin Mercy with [facility]  and discussed lab and imaging findings  Reevaluation:  After the interventions stated above, I reevaluated the patient and found patient is improving  Critical Interventions:  . BiPAP/noninvasive ventilation   James [name] was evaluated in Emergency Department on 04/23/2019 for the symptoms described in the history of present illness. He was evaluated in the context of the global COVID-19 pandemic, which necessitated consideration that the patient might be at risk for infection with the SARS-CoV-2 virus that causes COVID-19. Institutional protocols and algorithms that pertain to the evaluation of patients at risk for COVID-19 are in a state of rapid change based on information released by regulatory bodies including the CDC and federal and state organizations. These policies and algorithms were followed during the patient's care in the ED.  Final Clinical Impression(s) / ED Diagnoses Final diagnoses:  COPD exacerbation ([place])  Acute respiratory failure with hypoxia ([place])  Community acquired pneumonia of right lower lobe of lung    Rx / DC Orders ED Discharge Orders    None       Ripley Fraise, MD 04/23/19 [phone]

## 2019-04-23 NOTE — H&P (Signed)
History and Physical    James [name] [name] DOB: 1960/03/03 DOA: 04/23/2019  PCP: [name], MD Consultants:  None Patient coming from:  Home - lives with ; NOK: Christianne Borrow, [phone]  Chief Complaint: SOB  HPI: James [name] is a 59 y.o. male with medical history significant of HTN; COPD not on home O2; ETOH dependence; intellectual disability; and PNA (2018, 2020) presenting with SOB.  Quit smoking in 2014.  He lives in a non-air-conditioned log cabin.  He was hospitalized in May, July, August and September of last year for acute on chronic respiratory failure associated with COPD exacerbation and recurrent PNA.  He has not been hospitalized since - perhaps indicating that his living situation may be a significant contributing factor in the heat.  At his last PCP visit on 4/9, it was realized that the patient is apparently taking prednisone 40 mg daily (rather than as a prescribed taper to 10 mg daily) and is frequently running out of his medication as a result.  He is also on Spiriva, Dulera, and on qhs nebs. At that visit, he was given 30 mg daily x 2 weeks (so through 4/23) with the plan to decrease to 20 mg daily x 2 weeks and f/u outpatient with [name].  He is currently on BIPAP and generally unable to provide significant history at this time.  I spoke with his sister.  She took him to [place] last week for a pulmonary test to try to help him qualify for disability.  His doctor has been out of town and his other doctor decreased his prednisone and he felt short of breath.  He tried to work more than usual this weekend - painting a roof on his garage.  She thinks a wood stove that he used this winter may have been a contributing factor.  He did buy himself an [employer] unit but she is not sure if he is using it.  He cannot read or write and cannot follow directions and needs to be told what to do.  He goes into DTs when he spends a couple of nights in the hospital - "he  gets real hateful when he goes into DTs and that's not like him at all."  His sister asked if he is "clean" because he tends to have "filthy" fingernails.  She says to let him know she is coming and that he needs to behave.   ED Course: h/o COPD, hypoxic with SOB on admission.  Has PNA on CXr.  On BIPAP and improving.  COVID negative.  Review of Systems: Unable to effectively perform due to BIPAP  Ambulatory Status:  Ambulates without assistance  COVID Vaccine Status:  Unknown  Past Medical History:  Diagnosis Date  . Acute on chronic respiratory failure with hypoxia ([place]) 06/08/2013  . CAP (community acquired pneumonia) 05/17/2018   See admit 05/17/18 ? rml  with   covid pcr neg - rx augmentin > f/u cxr in 4-6 weeks is fine unless condition declines   . COPD (chronic obstructive pulmonary disease) ([place])    not on home  . Dyspnea   . Hypertension   . Pneumonia 04/06/2016  . Pneumothorax    2016, fell from ladder  . 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 Tick bite 06/03/2018    Past Surgical History:  Procedure Laterality Date  . VIDEO ASSISTED THORACOSCOPY (VATS)/THOROCOTOMY Left 06/11/2013   Procedure: VIDEO ASSISTED THORACOSCOPY (VATS)/THOROCOTOMY;  Surgeon: Ivin Poot, MD;  [facility]  OR;  Service: Thoracic;  Laterality: Left;  [name] VIDEO BRONCHOSCOPY N/A 06/11/2013   Procedure: VIDEO BRONCHOSCOPY;  Surgeon: Ivin Poot, MD;  Location: [place];  Service: Thoracic;  Laterality: N/A;  . VIDEO BRONCHOSCOPY N/A 08/10/2018   Procedure: VIDEO BRONCHOSCOPY WITH FLUORO;  Surgeon: Candee Furbish, MD;  Location[facility] ENDOSCOPY;  Service: Endoscopy;  Laterality: N/A;    Social History   Socioeconomic History  . Marital status: Divorced    Spouse name: Not on file  . Number of children: Not on file  . Years of education: Not on file  . Highest education level: Not on file  Occupational History  . Occupation: Dealer  . Occupation: Painter  Tobacco Use  . Smoking status: Former  Smoker    Packs/day: 1.50    Years: 40.00    Pack years: 60.00    Types: Cigarettes    Quit date: 2014    Years since quitting: 7.3  . Smokeless tobacco: Current User    Types: Chew  Substance and Sexual Activity  . Alcohol use: Yes    Alcohol/week: 28.0 standard drinks    Types: 28 Cans of beer per week    Comment: 4 beers a night; + jitters with not drinking, denies DTs or seizures  . Drug use: Yes    Types: Marijuana    Comment: daily; quit using crack and methamphetamine about 5 months ago   . Sexual activity: Not Currently    Partners: Female  Other Topics Concern  . Not on file  Social History Narrative   Lives alone near [place]   Social Determinants of Health   Financial Resource Strain: High Risk  . Difficulty of Paying Living Expenses: Hard  Food Insecurity: Unknown  . Worried About Charity fundraiser in the Last Year: Not on file  . Ran Out of Food in the Last Year: Never true  Transportation Needs: No Transportation Needs  . Lack of Transportation (Medical): No  . Lack of Transportation (Non-Medical): No  Physical Activity: Inactive  . Days of Exercise per Week: 0 days  . Minutes of Exercise per Session: 0 min  Stress: Stress Concern Present  . Feeling of Stress : To some extent  Social Connections: Slightly Isolated  . Frequency of Communication with Friends and Family: Three times a week  . Frequency of Social Gatherings with Friends and Family: Once a week  . Attends Religious Services: 1 to 4 times per year  . Active Member of Clubs or Organizations: Yes  . Attends Archivist Meetings: 1 to 4 times per year  . Marital Status: Divorced  Human resources officer Violence:   . Fear of Current or Ex-Partner:   . Emotionally Abused:   [name] Physically Abused:   . Sexually Abused:     Allergies  Allergen Reactions  . Gabapentin Other (See Comments)    hallucinations    Family History  Problem Relation Age of Onset  . Heart disease Father   .  COPD Sister     Prior to Admission medications   Medication Sig Start Date End Date Taking? Authorizing Provider  albuterol (PROVENTIL HFA;VENTOLIN HFA) 108 (90 Base) MCG/ACT inhaler Inhale 1-2 puffs into the lungs every 6 (six) hours as needed for wheezing or shortness of breath. 03/29/18   Scot Jun, FNP  albuterol (PROVENTIL) (2.5 MG/3ML) 0.083% nebulizer solution Take 3 mLs (2.5 mg total) by nebulization every 6 (six) hours as needed for wheezing or shortness of breath.  06/20/18   Scot Jun, FNP  folic acid (FOLVITE) 1 MG tablet Take 1 tablet (1 mg total) by mouth daily. 09/28/18   [name], MD  losartan (COZAAR) 100 MG tablet TAKE 1 TABLET (100 MG TOTAL) BY MOUTH DAILY. 01/12/19   [name], MD  mometasone-formoterol (DULERA) 200-5 MCG/ACT AERO Inhale 2 puffs into the lungs 2 (two) times daily. 07/18/18   Scot Jun, FNP  predniSONE (DELTASONE) 10 MG tablet Take 3 tablets daily PO x 14 days. 04/13/19   Ladell Pier, MD  Respiratory Therapy Supplies (FLUTTER) DEVI Use 4 times daily 04/27/18   [name], MD  Spacer/Aero-Holding Chambers (AEROCHAMBER MV) inhaler Use as instructed 08/03/18   [name], MD  thiamine (VITAMIN B-1) 100 MG tablet Take 1 tablet (100 mg total) by mouth daily. 08/03/18   [name], MD  tiotropium (SPIRIVA HANDIHALER) 18 MCG inhalation capsule Place 1 capsule (18 mcg total) into inhaler and inhale daily. 03/21/19 06/19/19  [name], MD  traMADol (ULTRAM) 50 MG tablet Take 1 tablet (50 mg total) by mouth every 8 (eight) hours as needed. Patient not taking: Reported on 03/07/2019 01/16/19   Jaynee Eagles, PA-C    Physical Exam: Vitals:   04/23/19 0830 04/23/19 0845 04/23/19 0915 04/23/19 0945  BP:      Pulse: (!) 104 98 98 95  Resp: (!) 21 (!) 21 19 19   Temp:      TempSrc:      SpO2: 97% 99% 97% 100%     . General:  Comfortable on BIPAP, difficulty speaking through the BIPAP . Eyes:   EOMI, normal  lids, iris . ENT:  grossly normal hearing, BIPAP in place . Neck:  no LAD, masses or thyromegaly . Cardiovascular:  RR with mild tachycardia, no m/r/g. No LE edema.  [name] Respiratory:   CTA bilaterally with no wheezes/rales/rhonchi.  Mildly increased respiratory effort. . Abdomen:  soft, NT, ND, NABS . Skin:  no rash or induration seen on limited exam . Musculoskeletal:  grossly normal tone BUE/BLE, good ROM, no bony abnormality . Lower extremity:  No LE edema.  Limited foot exam with no ulcerations.  2+ distal pulses. [name] Psychiatric:  On BIPAP, opens eyes to voice, touch, difficulty answering questions due to BIPAP . Neurologic:  Unable to perform    Radiological Exams on Admission: DG Chest Port 1 View  Result Date: 04/23/2019 CLINICAL DATA:  59 year old male with cough and shortness of breath. EXAM: PORTABLE CHEST 1 VIEW COMPARISON:  Chest radiographs 11/09/2018 and earlier. FINDINGS: Portable AP upright view at 0428 hours. Recurrent right lower lung airspace opacity appearing similar to portable chest radiographs and chest CT 08/09/2018. Underlying chronic pulmonary hyperinflation and interstitial markings. No pleural effusion is evident. Mediastinal contours remain normal. Numerous chronic bilateral rib fractures. No acute osseous abnormality identified. IMPRESSION: 1. Recurrent right lower lung airspace opacity compatible with pneumonia. No pleural effusion. 2. Underlying chronic lung disease. 3. Followup PA and lateral chest X-ray is recommended in 3-4 weeks following trial of antibiotic therapy to ensure resolution and exclude underlying malignancy. Electronically Signed   By: Genevie Ann M.D.   On: 04/23/2019 04:49    EKG: Independently reviewed.  Sinus tachycardia with rate 129; nonspecific ST changes with significant artifact   Labs on Admission: I have personally reviewed the available labs and imaging studies at the time of the admission.  Pertinent labs:   Glucose 145 BNP  58.3 Lactate 3.0,  2.5 WBC 11.8 Respiratory panel PCR negative ETOH 18 ABG (on BIPAP): 7.341/52.8/140/28.6  Assessment/Plan Principal Problem:   Acute on chronic respiratory failure with hypoxia and hypercapnia (HCC) Active Problems:   Hypertension   COPD exacerbation ([place])   Community acquired pneumonia of right lower lobe of lung   Marijuana abuse   Intellectual disability   Acute on chronic respiratory failure with hypercapnia/hypoxia due to RLL PNA -Patient's cough and shortness of breath plus x-ray findings are consistent with RLL PNA -SIRS criteria in this patient includes: Leukocytosis, tachycardia, tachypnea, hypoxia to 70s with EMS -Patient does not have hypotension; lactate is elevated but improving -While awaiting blood cultures, this appears to be a preseptic condition. -Sepsis protocol initiated -Patient placed on BIPAP with marked clinical improvement -Will admit to progressive care -Treat with Rocephin/Azithromycin for RLL PNA; aspiration PNA is also a consideration given his ETOH dependence but lesser concern for this as the source -Will trend lactate to ensure improvement -Will order lower respiratory tract procalcitonin level.  Antibiotics would not be indicated for PCT <0.1 and probably should not be used for < 0.25.  >0.5 indicates infection and >>0.5 indicates more serious disease.  As the procalcitonin level normalizes, it will be reasonable to consider de-escalation of antibiotic coverage. -Albuterol Nebulizer prn for SOB; standing Duonebs -Follow up blood culture x2, sputum culture   COPD -Patient with advanced COPD but not on home O2. -No longer smoking cigarettes but continues to routinely smoke marijuana -Continue Albuterol nebs and add standing Duonebs q4h -Hold Dulera and Spiriva while on BIPAP -Add Solumedrol 80 mg BID today and then resume 40 mg PO daily tomorrow assuming ongoing clinical improvement -resume flutter valve when off BIPAP -PFTs done  for disability testing, but PCP can f/u results as well  ETOH dependence -Patient with chronic ETOH dependence -He is at high risk for complications of withdrawal including seizures, DTs -SDU CIWA protocol -TOC team consult  HTN -Hold Cozaar while on BIPAP -Will cover with prn IV hydralazine  Marijuana abuse -Cessation should be encouraged -UDS ordered  Intellectual disability/social -Sister reports that he is "mentally retarded" and cannot read or write -He needs strict and simple directions -He continues to live in a log cabin without AC, but did recently buy a new unit; based on his frequent admissions in warmer weather, this appears to be a likely contributing factor -He has been weaned on his steroids and reports increasing respiratory difficulty when this happens -He has been working painting all weekend -[facility] team consulted   Note: This patient has been tested and is negative for the novel coronavirus COVID-19.  DVT prophylaxis:  Lovenox  Code Status:  Full - based on prior admission Family Communication: None present; I spoke with the patient's sister by telephone at the time of admission. Disposition Plan:  The patient is from: home  Anticipated d/c is to: home, likely to benefit from [facility] services  Anticipated d/c date will depend on clinical response to treatment, but likely several days given current BIPAP-dependent respiratory failure  Patient is currently: acutely ill Consults called: TOC team; nutrition; RT; PT/OT  Admission status:  Admit - It is my clinical opinion that admission to [place] is reasonable and necessary because of the expectation that this patient will require hospital care that crosses at least 2 midnights to treat this condition based on the medical complexity of the problems presented.  Given the aforementioned information, the predictability of an adverse outcome is felt to be significant.  Karmen Bongo MD [facility]   How  to contact the [facility] Attending or Consulting provider [place] or covering provider during after hours [place], for this patient?  1. Check the care team in [facility] and look for a) attending/consulting TRH provider listed and b) the [facility] team listed 2. Log into [email or web address] and use [place]'s universal password to access. If you do not have the password, please contact the hospital operator. 3. Locate the [facility] provider you are looking for under [facility] and page to a number that you can be directly reached. 4. If you still have difficulty reaching the provider, please page the [facility] (Director on Call) for the Hospitalists listed on amion for assistance.   04/23/2019, 10:25 AM

## 2019-04-23 NOTE — ED Triage Notes (Signed)
Pt here from home with c/o resp distress hx of copd , arrived on NRB  Received solumedrol 125  Nebs and mag gtt , sats on ems arrival in the 9 's

## 2019-04-23 NOTE — Progress Notes (Signed)
Pt taken to 5w38 on BiPAP without complications

## 2019-04-24 ENCOUNTER — Other Ambulatory Visit: Payer: Self-pay

## 2019-04-24 DIAGNOSIS — I10 Essential (primary) hypertension: Secondary | ICD-10-CM

## 2019-04-24 DIAGNOSIS — J441 Chronic obstructive pulmonary disease with (acute) exacerbation: Secondary | ICD-10-CM

## 2019-04-24 DIAGNOSIS — J189 Pneumonia, unspecified organism: Principal | ICD-10-CM

## 2019-04-24 DIAGNOSIS — F79 Unspecified intellectual disabilities: Secondary | ICD-10-CM

## 2019-04-24 LAB — CBC WITH DIFFERENTIAL/PLATELET
Abs Immature Granulocytes: 0.08 10*3/uL — ABNORMAL HIGH (ref 0.00–0.07)
Basophils Absolute: 0 10*3/uL (ref 0.0–0.1)
Basophils Relative: 0 %
Eosinophils Absolute: 0 10*3/uL (ref 0.0–0.5)
Eosinophils Relative: 0 %
HCT: 41.1 % (ref 39.0–52.0)
Hemoglobin: 13.3 g/dL (ref 13.0–17.0)
Immature Granulocytes: 1 %
Lymphocytes Relative: 4 %
Lymphs Abs: 0.7 10*3/uL (ref 0.7–4.0)
MCH: 32.8 pg (ref 26.0–34.0)
MCHC: 32.4 g/dL (ref 30.0–36.0)
MCV: 101.2 fL — ABNORMAL HIGH (ref 80.0–100.0)
Monocytes Absolute: 0.4 10*3/uL (ref 0.1–1.0)
Monocytes Relative: 3 %
Neutro Abs: 16.2 10*3/uL — ABNORMAL HIGH (ref 1.7–7.7)
Neutrophils Relative %: 92 %
Platelets: 262 10*3/uL (ref 150–400)
RBC: 4.06 MIL/uL — ABNORMAL LOW (ref 4.22–5.81)
RDW: 12.9 % (ref 11.5–15.5)
WBC: 17.4 10*3/uL — ABNORMAL HIGH (ref 4.0–10.5)
nRBC: 0 % (ref 0.0–0.2)

## 2019-04-24 LAB — BASIC METABOLIC PANEL
Anion gap: 8 (ref 5–15)
BUN: 13 mg/dL (ref 6–20)
CO2: 28 mmol/L (ref 22–32)
Calcium: 8.8 mg/dL — ABNORMAL LOW (ref 8.9–10.3)
Chloride: 106 mmol/L (ref 98–111)
Creatinine, Ser: 0.66 mg/dL (ref 0.61–1.24)
GFR calc Af Amer: >60 mL/min (ref >60)
GFR calc non Af Amer: >60 mL/min (ref >60)
Glucose, Bld: 145 mg/dL — ABNORMAL HIGH (ref 70–99)
Potassium: 4.2 mmol/L (ref 3.5–5.1)
Sodium: 142 mmol/L (ref 135–145)

## 2019-04-24 LAB — PROCALCITONIN: Procalcitonin: 0.28 ng/mL

## 2019-04-24 LAB — MRSA PCR SCREENING: MRSA by PCR: NEGATIVE

## 2019-04-24 MED ORDER — BUDESONIDE 0.5 MG/2ML IN SUSP
0.2500 mg | Freq: Two times a day (BID) | RESPIRATORY_TRACT | Status: DC
  Administered 2019-04-24 – 2019-04-26 (×5): 0.25 mg via RESPIRATORY_TRACT
  Filled 2019-04-24 (×6): qty 2

## 2019-04-24 MED ORDER — LOSARTAN POTASSIUM 50 MG PO TABS
50.0000 mg | ORAL_TABLET | Freq: Every day | ORAL | Status: DC
  Administered 2019-04-24 – 2019-04-25 (×2): 50 mg via ORAL
  Filled 2019-04-24 (×2): qty 1

## 2019-04-24 MED ORDER — IPRATROPIUM-ALBUTEROL 0.5-2.5 (3) MG/3ML IN SOLN: 3.0000 mL | Freq: Three times a day (TID) | RESPIRATORY_TRACT | Status: DC

## 2019-04-24 MED ORDER — METHYLPREDNISOLONE SODIUM SUCC 40 MG IJ SOLR
40.0000 mg | Freq: Two times a day (BID) | INTRAMUSCULAR | Status: DC
  Administered 2019-04-24 – 2019-04-27 (×7): 40 mg via INTRAVENOUS
  Filled 2019-04-24 (×7): qty 1

## 2019-04-24 MED ORDER — IPRATROPIUM-ALBUTEROL 0.5-2.5 (3) MG/3ML IN SOLN: 3.0000 mL | Freq: Four times a day (QID) | RESPIRATORY_TRACT | Status: DC | PRN

## 2019-04-24 MED ORDER — ENSURE ENLIVE PO LIQD
237.0000 mL | Freq: Two times a day (BID) | ORAL | Status: DC
  Administered 2019-04-24 – 2019-04-27 (×7): 237 mL via ORAL

## 2019-04-24 NOTE — Progress Notes (Signed)
Physical Therapy Evaluation  Patient presents with impaired balance, is tremulous (most likely from alcohol withdrawal), and has decreased safety awareness and health maintenance follow-through. Recommend continued skilled PT services and pending pt's mobility progression while in the hospital, discharge home with PT vs SNF for short term rehabilitation. If patient is withdrawing, then his mobility should improve after he has completed withdrawal. Currently, patient requires hands on assistance for safe mobility. At baseline, he lives alone and is independent with mobility. He admits he was painting his house and up on a ladder just prior to admission.    04/24/19 0918  PT Visit Information  Last PT Received On 04/24/19  Assistance Needed +1  PT/OT/SLP Co-Evaluation/Treatment Yes  Reason for Co-Treatment Complexity of the patient's impairments (multi-system involvement);For patient/therapist safety;Necessary to address cognition/behavior during functional activity  History of Present Illness 59 year old male admitted 04/23/19 with SOB. Patient with multiple admissions in May, July, August, September for acute on chronic respiratory failure associated wtih COPD exacerbation and recurrent PNA. It is thought that his living conditions (non air conditioned log cabin) may be a contributing factor. He was recently weakned on his steroids and reports increasing respiratory difficulty when this happens. Patient is at high risk for withdrawal incuding seizures and DTs due to his chronic Etoh dependence. CIWA protocol. CXR: RLL PNA. Placed on Bipap. PMH: HTN, COPD not on home O2, Etoh dependence, intellectual disability (cannot read or write), PNA   Precautions  Precautions Fall;Other (comment)  Precaution Comments HOB>30 degrees, O2 sat>92%  Restrictions  Weight Bearing Restrictions No  Home Living  Family/patient expects to be discharged to: Private residence  Living Arrangements Alone  Available Help at  Discharge Family;Available PRN/intermittently ((sister lives nearby and comes by most days))  Type of [place] to enter  Entrance Stairs-Number of Steps 2  Entrance Stairs-Rails Can reach both ((? if patient is good historian))  Home Layout One level  Pharmacist, hospital None  Prior Function  Level of Independence Independent  Comments sister assists with meals, housekeeping (sister comes by almost every day)  Communication  Communication No difficulties  Pain Assessment  Pain Assessment No/denies pain  Cognition  Arousal/Alertness Lethargic;Awake/alert (sleeping at first, but as session progressed alert)  Behavior During Therapy Impulsive  Overall Cognitive Status No family/caregiver present to determine baseline cognitive functioning  General Comments per chart review, patient cannot read or write at baseline, requires simple directions  Lower Extremity Assessment  Lower Extremity Assessment LLE deficits/detail;RLE deficits/detail  RLE Deficits / Details grossly 4+/5  LLE Deficits / Details grossly 4+/5  Cervical / Trunk Assessment  Cervical / Trunk Assessment  (tremulous due to withdrawal)  Bed Mobility  Overal bed mobility Needs Assistance  Bed Mobility Sit to Supine;Supine to Sit  Supine to sit Supervision;HOB elevated  Sit to supine Supervision  Transfers  Overall transfer level Needs assistance  Equipment used Rolling walker (2 wheeled)  Transfers Sit to/from Stand  Sit to Stand Min assist;+2 physical assistance  Ambulation/Gait  Ambulation/Gait assistance Min assist;+2 physical assistance;+2 safety/equipment  Gait Distance (Feet) 20 Feet  Assistive device Rolling walker (2 wheeled);2 person hand held assist  Gait Pattern/deviations Decreased step length - right;Decreased step length - left;Drifts right/left;Staggering right;Staggering left  General Gait Details Patient unsteady with ambulation 51ft with bilat  HHA, trialed RW for rest of gait trial with improved gait quality but still required minA for balance and safety.  Balance  Overall balance  assessment Needs assistance  Sitting-balance support Feet supported  Sitting balance-Leahy Scale Good  Sitting balance - Comments tremulous due to alcohol withdrawal  Postural control Posterior lean  Standing balance support No upper extremity supported  Standing balance-Leahy Scale Poor  Standing balance comment tremulous in standing  General Comments  General comments (skin integrity, edema, etc.) Patient on [place] at start of session as he was sleeping and tends to [place] per nurse. Room air trialed during evaluation. O2 sat 93% at rest, HR 94 bpm. O2 sat 90% or better with ambulation in room on room air. [place] placed back on at end of session as patient most likely will go back to sleep. Nurse informed.  PT - End of Session  Equipment Utilized During Treatment Gait belt;Oxygen  Activity Tolerance Patient tolerated treatment well  Patient left in bed;with call bell/phone within reach;with bed alarm set  Nurse Communication Mobility status;Other (comment) (oxygen saturation)  PT Assessment  PT Recommendation/Assessment Patient needs continued PT services  PT Visit Diagnosis Unsteadiness on feet (R26.81);Other abnormalities of gait and mobility (R26.89)  PT Problem List Decreased activity tolerance;Decreased balance;Decreased mobility;Decreased coordination;Decreased knowledge of use of DME;Decreased safety awareness;Decreased cognition  Barriers to Discharge Decreased caregiver support  Barriers to Discharge Comments patient lives alone  PT Plan  PT Frequency (ACUTE ONLY) Min 3X/week  PT Treatment/Interventions (ACUTE ONLY) DME instruction;Gait training;Stair training;Functional mobility training;Therapeutic activities;Therapeutic exercise;Balance training;Neuromuscular re-education;Patient/family education  AM-PAC PT "6 Clicks" Mobility Outcome Measure  (Version 2)  Help needed turning from your back to your side while in a flat bed without using bedrails? 4  Help needed moving from lying on your back to sitting on the side of a flat bed without using bedrails? 4  Help needed moving to and from a bed to a chair (including a wheelchair)? 3  Help needed standing up from a chair using your arms (e.g., wheelchair or bedside chair)? 3  Help needed to walk in hospital room? 3  Help needed climbing 3-5 steps with a railing?  2  6 Click Score 19  Consider Recommendation of Discharge To: Home with [facility]  PT Recommendation  Follow Up Recommendations Home health PT;SNF;Supervision/Assistance - 24 hour ((pending mobility progress while in the hospital))  PT equipment Other (comment) (TBD, possibly RW)  Individuals Consulted  Consulted and Agree with Results and Recommendations Patient  Acute Rehab PT Goals  Time For Goal Achievement 05/07/19  Potential to Achieve Goals Good  PT Time Calculation  PT Start Time (ACUTE ONLY) 8592  PT Stop Time (ACUTE ONLY) 0945  PT Time Calculation (min) (ACUTE ONLY) 27 min  PT General Charges  $$ ACUTE PT VISIT 1 Visit  PT Evaluation  $PT Eval Moderate Complexity 1 Mod   Birdie Hopes, PT, DPT Acute Rehab [phone] office

## 2019-04-24 NOTE — Progress Notes (Signed)
RT NOTES: Removed patient from bipap and placed on 3lpm nasal cannula. No distress noted. Sats 100%. Will continue to monitor.

## 2019-04-24 NOTE — Progress Notes (Signed)
Initial Nutrition Assessment  DOCUMENTATION CODES:   Not applicable  INTERVENTION:  Provide Ensure Enlive po BID, each supplement provides 350 kcal and 20 grams of protein  Encourage adequate PO intake.   NUTRITION DIAGNOSIS:   Increased nutrient needs related to chronic illness(COPD) as evidenced by estimated needs.  GOAL:   Patient will meet greater than or equal to 90% of their needs  MONITOR:   PO intake, Supplement acceptance, Skin, Weight trends, Labs, I & O's  REASON FOR ASSESSMENT:   Malnutrition Screening Tool, Consult Assessment of nutrition requirement/status  ASSESSMENT:   59 y.o. male with history of COPD, intellectual disability, HTN who presented with shortness of breath-found to have acute hypoxic respiratory failure secondary to pneumonia and COPD exacerbation.  Diet advanced this morning. Pt did not consume breakfast this AM due to pt sleeping. RN able to awaken pt during time of visit. Pt reports no abdominal discomfort and having a good appetite. Pt reports he will consume lunch when it arrives. He reports eating well PTA with usual consumption of at least 3 meals a day. RD to order nutritional supplements to aid in caloric and protein needs.   NUTRITION - FOCUSED PHYSICAL EXAM:    Most Recent Value  Orbital Region  No depletion  Upper Arm Region  No depletion  Thoracic and Lumbar Region  No depletion  Buccal Region  No depletion  Temple Region  No depletion  Clavicle Bone Region  No depletion  Clavicle and Acromion Bone Region  No depletion  Scapular Bone Region  Unable to assess  Dorsal Hand  No depletion  Patellar Region  No depletion  Anterior Thigh Region  Mild depletion  Posterior Calf Region  No depletion  Edema (RD Assessment)  None  Hair  Reviewed  Eyes  Reviewed  Mouth  Reviewed  Skin  Reviewed  Nails  Reviewed      Labs and medications reviewed.   Diet Order:   Diet Order            Diet Heart Room service appropriate? Yes;  Fluid consistency: Thin  Diet effective now              EDUCATION NEEDS:   Not appropriate for education at this time  Skin:  Skin Assessment: Reviewed RN Assessment  Last BM:  4/20  Height:   Ht Readings from Last 1 Encounters:  04/24/19 5\' 9"  (1.753 m)    Weight:   Wt Readings from Last 1 Encounters:  04/24/19 83.2 kg   BMI:  Body mass index is 27.09 kg/m.  Estimated Nutritional Needs:   Kcal:  2000-2200  Protein:  100-110 grams  Fluid:  >/= 2 L/day   Corrin Parker, MS, RD, LDN RD pager number/after hours weekend pager number on Amion.

## 2019-04-24 NOTE — Progress Notes (Addendum)
PROGRESS NOTE        PATIENT DETAILS Name: James [name] Age: 59 y.o. Sex: male Date of Birth: 07-30-1960 Admit Date: 04/23/2019 Admitting Physician Karmen Bongo, MD DGL:OVFIEP, Burnett Harry, MD  Brief Narrative: Patient is a 59 y.o. male with history of COPD, intellectual disability, HTN who presented with shortness of breath-found to have acute hypoxic respiratory failure secondary to pneumonia and COPD exacerbation.  Patient initially required BiPAP on admission.  See below for further details.  Significant events: 4/19>> admit to [facility] for severe hypoxemia requiring BiPAP 4/20>> liberated from BiPAP  Antimicrobial therapy: Rocephin 4/18>> Zithromax 4/18>>  Microbiology data: Blood culture: 4/19>> pending  Procedures : None  Consults: None  DVT Prophylaxis : Prophylactic Lovenox   Subjective: Feels better-on 2-3 L of oxygen-liberated from BiPAP this morning.  Continues to have coughing spells.  Assessment/Plan: Acute hypoxic/hypercarbic respiratory failure secondary to COPD exacerbation and pneumonia: Improved-liberated off BiPAP this morning.  Awake/alert-on 2-3 L of oxygen.  Await culture data-continue IV antibiotics, steroids and bronchodilators.  Note-appears to be on chronic steroids per H&P-we will need to place back on prednisone as outlined in H&P when not felt to require IV steroids.  HTN: BP controlled-we will go ahead and resume losartan at half of usual dosing.  EtOH abuse: No signs of withdrawal-continue Ativan per protocol.  Per patient's sister-he drinks around 2-3 bottles of beer on a daily basis.  Intellectual disability: At baseline  Diet: Diet Order            Diet Heart Room service appropriate? Yes; Fluid consistency: Thin  Diet effective now               Code Status: Full code   Family Communication: Sister ([name])-tel [phone] the phone  Disposition Plan: Probably Home with Home health v when  ready for discharge  Barriers to Discharge: PNA requiring IV antibiotics, hypoxia requiring oxygen supplementation  Antimicrobial agents: Anti-infectives (From admission, onward)   Start     Dose/Rate Route Frequency Ordered Stop   04/23/19 0500  cefTRIAXone (ROCEPHIN) 2 g in sodium chloride 0.9 % 100 mL IVPB     2 g 200 mL/hr over 30 Minutes Intravenous Every 24 hours 04/23/19 0459     04/23/19 0500  azithromycin (ZITHROMAX) 500 mg in sodium chloride 0.9 % 250 mL IVPB     500 mg 250 mL/hr over 60 Minutes Intravenous Every 24 hours 04/23/19 0459         Time spent: 25 minutes-Greater than 50% of this time was spent in counseling, explanation of diagnosis, planning of further management, and coordination of care.  MEDICATIONS: Scheduled Meds: . diazepam  2.5 mg Intravenous Q6H  . docusate sodium  100 mg Oral BID  . enoxaparin (LOVENOX) injection  40 mg Subcutaneous Q24H  . folic acid  1 mg Oral Daily  . methylPREDNISolone (SOLU-MEDROL) injection  40 mg Intravenous Q12H  . multivitamin with minerals  1 tablet Oral Daily  . sodium chloride flush  3 mL Intravenous Q12H  . thiamine  100 mg Oral Daily   Or  . thiamine  100 mg Intravenous Daily   Continuous Infusions: . sodium chloride 75 mL/hr at 04/24/19 0026  . azithromycin 500 mg (04/24/19 0452)  . cefTRIAXone (ROCEPHIN)  IV 2 g (04/24/19 0413)   PRN Meds:.acetaminophen **OR** acetaminophen, albuterol, bisacodyl,  hydrALAZINE, HYDROcodone-acetaminophen, ipratropium-albuterol, LORazepam **OR** LORazepam, morphine injection, ondansetron **OR** ondansetron (ZOFRAN) IV, polyethylene glycol   PHYSICAL EXAM: Vital signs: Vitals:   04/24/19 0750 04/24/19 0800 04/24/19 0846 04/24/19 1016  BP:  120/63    Pulse: 73 76 83 83  Resp: 15 15 (!) 22 17  Temp:      TempSrc:      SpO2: 100% 100% 94% 96%  Weight:      Height:       Filed Weights   04/24/19 0314  Weight: 83.2 kg   Body mass index is 27.09 kg/m.   Gen Exam:Alert  awake-not in any distress.  Very disheveled HEENT:atraumatic, normocephalic Chest: B/L clear to auscultation anteriorly-still has some scattered rhonchi CVS:S1S2 regular Abdomen:soft non tender, non distended Extremities:no edema Neurology: Non focal Skin: no rash  I have personally reviewed following labs and imaging studies  LABORATORY DATA: CBC: Recent Labs  Lab 04/23/19 0416 04/23/19 0857 04/24/19 0409  WBC 11.8*  --  17.4*  NEUTROABS 7.1  --  16.2*  HGB 15.7 14.6 13.3  HCT 48.3 43.0 41.1  MCV 101.0*  --  101.2*  PLT 325  --  017    Basic Metabolic Panel: Recent Labs  Lab 04/23/19 0416 04/23/19 0857 04/24/19 0409  NA 144 139 142  K 4.1 4.1 4.2  CL 101  --  106  CO2 25  --  28  GLUCOSE 145*  --  145*  BUN 13  --  13  CREATININE 0.89  --  0.66  CALCIUM 9.7  --  8.8*    GFR: Estimated Creatinine Clearance: 101.9 mL/min (by C-G formula based on SCr of 0.66 mg/dL).  Liver Function Tests: Recent Labs  Lab 04/23/19 1037  AST 28  ALT 26  ALKPHOS 34*  BILITOT 0.5  PROT 5.9*  ALBUMIN 3.4*   No results for input(s): LIPASE, AMYLASE in the last 168 hours. No results for input(s): AMMONIA in the last 168 hours.  Coagulation Profile: No results for input(s): INR, PROTIME in the last 168 hours.  Cardiac Enzymes: No results for input(s): CKTOTAL, CKMB, CKMBINDEX, TROPONINI in the last 168 hours.  BNP (last 3 results) No results for input(s): PROBNP in the last 8760 hours.  Lipid Profile: No results for input(s): CHOL, HDL, LDLCALC, TRIG, CHOLHDL, LDLDIRECT in the last 72 hours.  Thyroid Function Tests: No results for input(s): TSH, T4TOTAL, FREET4, T3FREE, THYROIDAB in the last 72 hours.  Anemia Panel: No results for input(s): VITAMINB12, FOLATE, FERRITIN, TIBC, IRON, RETICCTPCT in the last 72 hours.  Urine analysis:    Component Value Date/Time   COLORURINE YELLOW 09/12/2018 0415   APPEARANCEUR CLEAR 09/12/2018 0415   LABSPEC >=1.030 02/19/2019  1029   PHURINE 5.5 02/19/2019 1029   GLUCOSEU 500 (A) 02/19/2019 1029   HGBUR NEGATIVE 02/19/2019 1029   BILIRUBINUR NEGATIVE 02/19/2019 1029   BILIRUBINUR negative 09/25/2018 1432   KETONESUR NEGATIVE 02/19/2019 1029   PROTEINUR NEGATIVE 02/19/2019 1029   UROBILINOGEN 0.2 02/19/2019 1029   NITRITE NEGATIVE 02/19/2019 1029   LEUKOCYTESUR NEGATIVE 02/19/2019 1029    Sepsis Labs: Lactic Acid, Venous    Component Value Date/Time   LATICACIDVEN 1.8 04/23/2019 2115    MICROBIOLOGY: Recent Results (from the past 240 hour(s))  Respiratory Panel by RT PCR (Flu A&B, Covid) - Nasopharyngeal Swab     Status: None   Collection Time: 04/23/19  4:19 AM   Specimen: Nasopharyngeal Swab  Result Value Ref Range Status   SARS Coronavirus 2  by RT PCR NEGATIVE NEGATIVE Final    Comment: (NOTE) SARS-CoV-2 target nucleic acids are NOT DETECTED. The SARS-CoV-2 RNA is generally detectable in upper respiratoy specimens during the acute phase of infection. The lowest concentration of SARS-CoV-2 viral copies this assay can detect is 131 copies/mL. A negative result does not preclude SARS-Cov-2 infection and should not be used as the sole basis for treatment or other patient management decisions. A negative result may occur with  improper specimen collection/handling, submission of specimen other than nasopharyngeal swab, presence of viral mutation(s) within the areas targeted by this assay, and inadequate number of viral copies (<131 copies/mL). A negative result must be combined with clinical observations, patient history, and epidemiological information. The expected result is Negative. Fact Sheet for Patients:  PinkCheek.be Fact Sheet for Healthcare Providers:  GravelBags.it This test is not yet ap proved or cleared by the [place] FDA and  has been authorized for detection and/or diagnosis of SARS-CoV-2 by FDA under an Emergency Use  Authorization (EUA). This EUA will remain  in effect (meaning this test can be used) for the duration of the COVID-19 declaration under Section 564(b)(1) of the Act, 21 U.S.C. section 360bbb-3(b)(1), unless the authorization is terminated or revoked sooner.    Influenza A by PCR NEGATIVE NEGATIVE Final   Influenza B by PCR NEGATIVE NEGATIVE Final    Comment: (NOTE) The Xpert Xpress SARS-CoV-2/FLU/RSV assay is intended as an aid in  the diagnosis of influenza from Nasopharyngeal swab specimens and  should not be used as a sole basis for treatment. Nasal washings and  aspirates are unacceptable for Xpert Xpress SARS-CoV-2/FLU/RSV  testing. Fact Sheet for Patients: PinkCheek.be Fact Sheet for Healthcare Providers: GravelBags.it This test is not yet approved or cleared by the [place] FDA and  has been authorized for detection and/or diagnosis of SARS-CoV-2 by  FDA under an Emergency Use Authorization (EUA). This EUA will remain  in effect (meaning this test can be used) for the duration of the  Covid-19 declaration under Section 564(b)(1) of the Act, 21  U.S.C. section 360bbb-3(b)(1), unless the authorization is  terminated or revoked. Performed at [facility], [address] [place], [place] [address]     RADIOLOGY STUDIES/RESULTS: DG Chest Port 1 View  Result Date: 04/23/2019 CLINICAL DATA:  59 year old male with cough and shortness of breath. EXAM: PORTABLE CHEST 1 VIEW COMPARISON:  Chest radiographs 11/09/2018 and earlier. FINDINGS: Portable AP upright view at 0428 hours. Recurrent right lower lung airspace opacity appearing similar to portable chest radiographs and chest CT 08/09/2018. Underlying chronic pulmonary hyperinflation and interstitial markings. No pleural effusion is evident. Mediastinal contours remain normal. Numerous chronic bilateral rib fractures. No acute osseous abnormality identified.  IMPRESSION: 1. Recurrent right lower lung airspace opacity compatible with pneumonia. No pleural effusion. 2. Underlying chronic lung disease. 3. Followup PA and lateral chest X-ray is recommended in 3-4 weeks following trial of antibiotic therapy to ensure resolution and exclude underlying malignancy. Electronically Signed   By: Genevie Ann M.D.   On: 04/23/2019 04:49     LOS: 1 day   Oren Binet, MD  [facility]    To contact the attending provider between 7A-7P or the covering provider during after hours 7P-7A, please log into the web site [email or web address] and access using universal [place] password for that web site. If you do not have the password, please call the hospital operator.  04/24/2019, 11:09 AM

## 2019-04-24 NOTE — Progress Notes (Signed)
Chaplain notarized AD for James [name].  De Burrs Chaplain Resident

## 2019-04-24 NOTE — Progress Notes (Signed)
CSW received call from RN that patient's sister, Daleen Bo [phone]) in [place], would like to speak with CSW. CSW spoke with her and answered questions regarding getting HCPOA paperwork signed as family had been working with the patient to complete it before he had to come to the hospital. She will speak with her sister to see when she can arrange to come to the hospital (CSW advised before 3pm when volunteers are available). She reported she is also working on getting patient an ID and Disability as well as durable POA.    Ragan LCSW

## 2019-04-24 NOTE — Progress Notes (Signed)
Occupational Therapy Evaluation Patient Details Name: James [name] MRN: [id number] DOB: January 16, 1960 Today's Date: 04/24/2019    History of Present Illness 59 year old male admitted 04/23/19 with SOB. Patient with multiple admissions in May, July, August, September for acute on chronic respiratory failure associated wtih COPD exacerbation and recurrent PNA. It is thought that his living conditions (non air conditioned log cabin) may be a contributing factor. He was recently weakned on his steroids and reports increasing respiratory difficulty when this happens. Patient is at high risk for withdrawal incuding seizures and DTs due to his chronic Etoh dependence. CIWA protocol. CXR: RLL PNA. Placed on Bipap. PMH: HTN, COPD not on home O2, Etoh dependence, intellectual disability (cannot read or write), PNA    Clinical Impression   PTA, pt reports being Independent for ADLs and mobility with sister that assists with IADLs. Pt reports sister lives close by and stops by almost every day. Pt Supervision for bed mobility, Min A for sit to stand, Min A + 2 for safe mobility trialing HHA and RW. Pt with tremors during activities and in standing (unsure if due to withdrawal or medications, etc). Pt unsteady in standing and requires hands on assist throughout. Pt Supervision/setup for brushing hair seated (did not attempt standing for task at this time) and requiring min cues for sequencing. Anticipate pt will be able to progress with functional abilities at acute level. Recommend HHOT at DC if sister still able to provide support/assistance with IADLs. If pt continues with tremors and unsteadiness, will update recommendation to SNF for short term rehab due to pt living alone.     Follow Up Recommendations  Home health OT;SNF(HH if pt has family support)    Equipment Recommendations  Other (comment)(RW)    Recommendations for Other Services       Precautions / Restrictions Precautions Precautions:  Fall;Other (comment) Precaution Comments: HOB>30 degrees, O2 sat>92% Restrictions Weight Bearing Restrictions: No      Mobility Bed Mobility Overal bed mobility: Needs Assistance Bed Mobility: Sit to Supine;Supine to Sit     Supine to sit: Supervision;HOB elevated Sit to supine: Supervision      Transfers Overall transfer level: Needs assistance Equipment used: Rolling walker (2 wheeled) Transfers: Sit to/from Stand Sit to Stand: Min assist;+2 physical assistance              Balance Overall balance assessment: Needs assistance Sitting-balance support: Feet supported Sitting balance-Leahy Scale: Good Sitting balance - Comments: tremulous due to alcohol withdrawal Postural control: Posterior lean Standing balance support: No upper extremity supported Standing balance-Leahy Scale: Poor Standing balance comment: tremulous in standing                           ADL either performed or assessed with clinical judgement   ADL Overall ADL's : Needs assistance/impaired Eating/Feeding: Independent;Sitting   Grooming: Set up;Supervision/safety;Sitting;Brushing hair Grooming Details (indicate cue type and reason): Cues for sequencing appropriately  Upper Body Bathing: Set up;Sitting   Lower Body Bathing: Minimal assistance;Sit to/from stand;Sitting/lateral leans   Upper Body Dressing : Set up;Sitting   Lower Body Dressing: Minimal assistance;Sit to/from stand;Sitting/lateral leans   Toilet Transfer: Minimal assistance;Stand-pivot   Toileting- Clothing Manipulation and Hygiene: Moderate assistance;Sit to/from stand;Sitting/lateral lean       Functional mobility during ADLs: Minimal assistance;+2 for physical assistance;+2 for safety/equipment;Rolling walker(HHA, then RW) General ADL Comments: pt with tremors during activity and in standing, unsteadiness in standing that impacts safety  with tasks      Vision         Perception     Praxis       Pertinent Vitals/Pain Pain Assessment: No/denies pain     Hand Dominance     Extremity/Trunk Assessment Upper Extremity Assessment Upper Extremity Assessment: Overall WFL for tasks assessed   Lower Extremity Assessment RLE Deficits / Details: grossly 4+/5 LLE Deficits / Details: grossly 4+/5   Cervical / Trunk Assessment Cervical / Trunk Assessment: (tremulous due to withdrawal)   Communication Communication Communication: No difficulties   Cognition Arousal/Alertness: Lethargic;Awake/alert(sleeping at first, but as session progressed alert) Behavior During Therapy: Impulsive Overall Cognitive Status: No family/caregiver present to determine baseline cognitive functioning                                 General Comments: per chart review, patient cannot read or write at baseline, requires simple directions   General Comments  Patient on [place] at start of session as he was sleeping and tends to [place] per nurse. Room air trialed during evaluation. O2 sat 93% at rest, HR 94 bpm. O2 sat 90% or better with ambulation in room on room air. [place] placed back on at end of session as patient most likely will go back to sleep. Nurse informed.    Exercises     Shoulder Instructions      Home Living Family/patient expects to be discharged to:: Private residence Living Arrangements: Alone Available Help at Discharge: Family;Available PRN/intermittently((pt reports sister lives nearby and comes by most days)) Type of Home: House Home Access: Stairs to enter [employer] of Steps: 2 Entrance Stairs-Rails: Can reach both((? if patient is good historian)) Home Layout: One level     Bathroom Shower/Tub: Tub/shower unit         Home Equipment: None   Additional Comments: History obtained from pt, unsure of accuracy. Noted in SW note that pt's sister lives in [place]      Prior Functioning/Environment Level of Independence: Independent        Comments:  sister assists with meals, housekeeping (sister comes by almost every day)        OT Problem List: Decreased strength;Decreased activity tolerance;Impaired balance (sitting and/or standing);Decreased coordination;Decreased safety awareness;Decreased knowledge of use of DME or AE      OT Treatment/Interventions: Self-care/ADL training;Therapeutic exercise;Energy conservation;DME and/or AE instruction;Therapeutic activities;Patient/family education    OT Goals(Current goals can be found in the care plan section) Acute Rehab OT Goals Patient Stated Goal: eat some lunch OT Goal Formulation: With patient Time For Goal Achievement: 05/08/19 Potential to Achieve Goals: Good ADL Goals Pt Will Perform Grooming: with supervision;standing Pt Will Perform Upper Body Bathing: with modified independence;sitting Pt Will Perform Lower Body Bathing: with supervision;sitting/lateral leans;sit to/from stand Pt Will Perform Lower Body Dressing: with supervision;sit to/from stand;sitting/lateral leans Pt Will Transfer to Toilet: with supervision;ambulating;regular height toilet Pt Will Perform Toileting - Clothing Manipulation and hygiene: with supervision;sitting/lateral leans;sit to/from stand  OT Frequency: Min 3X/week   Barriers to D/C:            Co-evaluation PT/OT/SLP Co-Evaluation/Treatment: Yes Reason for Co-Treatment: Complexity of the patient's impairments (multi-system involvement);Necessary to address cognition/behavior during functional activity;For patient/therapist safety   OT goals addressed during session: ADL's and self-care      AM-PAC OT "6 Clicks" Daily Activity     Outcome Measure Help from another person eating meals?: None Help  from another person taking care of personal grooming?: A Little Help from another person toileting, which includes using toliet, bedpan, or urinal?: A Little Help from another person bathing (including washing, rinsing, drying)?: A Little Help  from another person to put on and taking off regular upper body clothing?: A Little Help from another person to put on and taking off regular lower body clothing?: A Little 6 Click Score: 19   End of Session Equipment Utilized During Treatment: Gait belt;Rolling walker Nurse Communication: Mobility status  Activity Tolerance: Patient tolerated treatment well Patient left: in bed;with call bell/phone within reach;with bed alarm set  OT Visit Diagnosis: Unsteadiness on feet (R26.81);Other abnormalities of gait and mobility (R26.89);Muscle weakness (generalized) (M62.81)                Time: 3958-4417 OT Time Calculation (min): 27 min Charges:  OT General Charges $OT Visit: 1 Visit OT Evaluation $OT Eval Moderate Complexity: 1 Mod  Layla Maw, OTR/L  Layla Maw 04/24/2019, 2:50 PM

## 2019-04-24 NOTE — Plan of Care (Signed)
Plan of care reviewed with pt at bedside and sister [name] over phone. BIPAP taken off this am, pt able to maintain sats on 3Lnc. Pt drowsy at times. X2 assist with walker.  Call bell and urinal in reach. pt stable at this time, will continue to monitor.  Problem: Education: Goal: Knowledge of General Education information will improve Description: Including pain rating scale, medication(s)/side effects and non-pharmacologic comfort measures Outcome: Progressing   Problem: Health Behavior/Discharge Planning: Goal: Ability to manage health-related needs will improve Outcome: Progressing   Problem: Clinical Measurements: Goal: Ability to maintain clinical measurements within normal limits will improve Outcome: Progressing Goal: Will remain free from infection Outcome: Progressing Goal: Diagnostic test results will improve Outcome: Progressing Goal: Respiratory complications will improve Outcome: Progressing Goal: Cardiovascular complication will be avoided Outcome: Progressing   Problem: Activity: Goal: Risk for activity intolerance will decrease Outcome: Progressing   Problem: Nutrition: Goal: Adequate nutrition will be maintained Outcome: Progressing   Problem: Coping: Goal: Level of anxiety will decrease Outcome: Progressing   Problem: Elimination: Goal: Will not experience complications related to bowel motility Outcome: Progressing Goal: Will not experience complications related to urinary retention Outcome: Progressing   Problem: Pain Managment: Goal: General experience of comfort will improve Outcome: Progressing   Problem: Safety: Goal: Ability to remain free from injury will improve Outcome: Progressing   Problem: Skin Integrity: Goal: Risk for impaired skin integrity will decrease Outcome: Progressing

## 2019-04-25 LAB — BASIC METABOLIC PANEL
Anion gap: 10 (ref 5–15)
BUN: 21 mg/dL — ABNORMAL HIGH (ref 6–20)
CO2: 28 mmol/L (ref 22–32)
Calcium: 9.5 mg/dL (ref 8.9–10.3)
Chloride: 103 mmol/L (ref 98–111)
Creatinine, Ser: 0.77 mg/dL (ref 0.61–1.24)
GFR calc Af Amer: >60 mL/min (ref >60)
GFR calc non Af Amer: >60 mL/min (ref >60)
Glucose, Bld: 152 mg/dL — ABNORMAL HIGH (ref 70–99)
Potassium: 4.4 mmol/L (ref 3.5–5.1)
Sodium: 141 mmol/L (ref 135–145)

## 2019-04-25 LAB — CBC
HCT: 45.2 % (ref 39.0–52.0)
Hemoglobin: 14.6 g/dL (ref 13.0–17.0)
MCH: 32.8 pg (ref 26.0–34.0)
MCHC: 32.3 g/dL (ref 30.0–36.0)
MCV: 101.6 fL — ABNORMAL HIGH (ref 80.0–100.0)
Platelets: 276 10*3/uL (ref 150–400)
RBC: 4.45 MIL/uL (ref 4.22–5.81)
RDW: 12.6 % (ref 11.5–15.5)
WBC: 15.8 10*3/uL — ABNORMAL HIGH (ref 4.0–10.5)
nRBC: 0 % (ref 0.0–0.2)

## 2019-04-25 LAB — PROCALCITONIN: Procalcitonin: 0.13 ng/mL

## 2019-04-25 MED ORDER — LOSARTAN POTASSIUM 50 MG PO TABS
100.0000 mg | ORAL_TABLET | Freq: Every day | ORAL | Status: DC
  Administered 2019-04-26 – 2019-04-27 (×2): 100 mg via ORAL
  Filled 2019-04-25 (×2): qty 2

## 2019-04-25 NOTE — Progress Notes (Signed)
PROGRESS NOTE        PATIENT DETAILS Name: James [name] Age: 59 y.o. Sex: male Date of Birth: February 27, 1960 Admit Date: 04/23/2019 Admitting Physician Karmen Bongo, MD KJZ:PHXTAV, Burnett Harry, MD  Brief Narrative: Patient is a 59 y.o. male with history of COPD, intellectual disability, HTN who presented with shortness of breath-found to have acute hypoxic respiratory failure secondary to pneumonia and COPD exacerbation.  Patient initially required BiPAP on admission.  See below for further details.  Significant events: 4/19>> admit to [facility] for severe hypoxemia requiring BiPAP 4/20>> liberated from BiPAP  Antimicrobial therapy: Rocephin 4/18>> Zithromax 4/18>>  Microbiology data: Blood culture: 4/19>> negative  Procedures : None  Consults: None  DVT Prophylaxis : Prophylactic Lovenox   Subjective: Feels better-just titrated to room air early this morning.  Still coughing.  Assessment/Plan: Acute hypoxic/hypercarbic respiratory failure secondary to COPD exacerbation and pneumonia: Improved-initially required BiPAP on admission-titrated off BiPAP on 4/20.  Improving with steroids, bronchodilators and IV antibiotics-titrated to room air today.  Blood cultures so far negative-plan is to continue with IV antibiotic/steroid/bronchodilators-follow clinical trajectory-if clinical improvement continues-we can contemplate discharge in the next day or so.   Note-appears to be on chronic steroids per H&P-we will need to place back on prednisone as outlined in H&P when not felt to require IV steroids.  HTN: BP on the higher side-resume usual dosing of losartan.   EtOH abuse: No signs of withdrawal-continue Ativan per protocol.  Per patient's sister-he drinks around 2-3 bottles of beer on a daily basis.  Intellectual disability: At baseline  Diet: Diet Order            Diet Heart Room service appropriate? No; Fluid consistency: Thin  Diet effective now               Code Status: Full code   Family Communication: Sister ([name])-tel [phone]-over the phone on 4/21  Disposition Plan: Probably Home with Home health  when ready for discharge  Barriers to Discharge: PNA requiring IV antibiotics, hypoxia requiring oxygen supplementation  Antimicrobial agents: Anti-infectives (From admission, onward)   Start     Dose/Rate Route Frequency Ordered Stop   04/23/19 0500  cefTRIAXone (ROCEPHIN) 2 g in sodium chloride 0.9 % 100 mL IVPB     2 g 200 mL/hr over 30 Minutes Intravenous Every 24 hours 04/23/19 0459     04/23/19 0500  azithromycin (ZITHROMAX) 500 mg in sodium chloride 0.9 % 250 mL IVPB     500 mg 250 mL/hr over 60 Minutes Intravenous Every 24 hours 04/23/19 0459         Time spent: 25 minutes-Greater than 50% of this time was spent in counseling, explanation of diagnosis, planning of further management, and coordination of care.  MEDICATIONS: Scheduled Meds: . budesonide (PULMICORT) nebulizer solution  0.25 mg Nebulization BID  . diazepam  2.5 mg Intravenous Q6H  . docusate sodium  100 mg Oral BID  . enoxaparin (LOVENOX) injection  40 mg Subcutaneous Q24H  . feeding supplement (ENSURE ENLIVE)  237 mL Oral BID BM  . folic acid  1 mg Oral Daily  . losartan  50 mg Oral Daily  . methylPREDNISolone (SOLU-MEDROL) injection  40 mg Intravenous Q12H  . multivitamin with minerals  1 tablet Oral Daily  . sodium chloride flush  3 mL Intravenous Q12H  . thiamine  100 mg Oral Daily   Or  . thiamine  100 mg Intravenous Daily   Continuous Infusions: . sodium chloride 10 mL/hr at 04/24/19 1137  . azithromycin 500 mg (04/25/19 0451)  . cefTRIAXone (ROCEPHIN)  IV 2 g (04/25/19 0635)   PRN Meds:.acetaminophen **OR** acetaminophen, albuterol, bisacodyl, hydrALAZINE, HYDROcodone-acetaminophen, LORazepam **OR** LORazepam, morphine injection, ondansetron **OR** ondansetron (ZOFRAN) IV, polyethylene glycol   PHYSICAL EXAM: Vital  signs: Vitals:   04/24/19 2335 04/25/19 0400 04/25/19 0757 04/25/19 1216  BP: (!) 145/82 (!) 140/98 129/86 (!) 160/90  Pulse: 86 80 74 91  Resp:  16 19 20   Temp:   98.1 F (36.7 C) 98.3 F (36.8 C)  TempSrc:   Oral Oral  SpO2:  93% 92% 91%  Weight:      Height:       Filed Weights   04/24/19 0314  Weight: 83.2 kg   Body mass index is 27.09 kg/m.   Gen Exam:Alert awake-not in any distress HEENT:atraumatic, normocephalic Chest: B/L clear to auscultation anteriorly CVS:S1S2 regular Abdomen:soft non tender, non distended Extremities:no edema Neurology: Non focal Skin: no rash  I have personally reviewed following labs and imaging studies  LABORATORY DATA: CBC: Recent Labs  Lab 04/23/19 0416 04/23/19 0857 04/24/19 0409 04/25/19 0412  WBC 11.8*  --  17.4* 15.8*  NEUTROABS 7.1  --  16.2*  --   HGB 15.7 14.6 13.3 14.6  HCT 48.3 43.0 41.1 45.2  MCV 101.0*  --  101.2* 101.6*  PLT 325  --  262 546    Basic Metabolic Panel: Recent Labs  Lab 04/23/19 0416 04/23/19 0857 04/24/19 0409 04/25/19 0412  NA 144 139 142 141  K 4.1 4.1 4.2 4.4  CL 101  --  106 103  CO2 25  --  28 28  GLUCOSE 145*  --  145* 152*  BUN 13  --  13 21*  CREATININE 0.89  --  0.66 0.77  CALCIUM 9.7  --  8.8* 9.5    GFR: Estimated Creatinine Clearance: 101.9 mL/min (by C-G formula based on SCr of 0.77 mg/dL).  Liver Function Tests: Recent Labs  Lab 04/23/19 1037  AST 28  ALT 26  ALKPHOS 34*  BILITOT 0.5  PROT 5.9*  ALBUMIN 3.4*   No results for input(s): LIPASE, AMYLASE in the last 168 hours. No results for input(s): AMMONIA in the last 168 hours.  Coagulation Profile: No results for input(s): INR, PROTIME in the last 168 hours.  Cardiac Enzymes: No results for input(s): CKTOTAL, CKMB, CKMBINDEX, TROPONINI in the last 168 hours.  BNP (last 3 results) No results for input(s): PROBNP in the last 8760 hours.  Lipid Profile: No results for input(s): CHOL, HDL, LDLCALC, TRIG,  CHOLHDL, LDLDIRECT in the last 72 hours.  Thyroid Function Tests: No results for input(s): TSH, T4TOTAL, FREET4, T3FREE, THYROIDAB in the last 72 hours.  Anemia Panel: No results for input(s): VITAMINB12, FOLATE, FERRITIN, TIBC, IRON, RETICCTPCT in the last 72 hours.  Urine analysis:    Component Value Date/Time   COLORURINE YELLOW 09/12/2018 0415   APPEARANCEUR CLEAR 09/12/2018 0415   LABSPEC >=1.030 02/19/2019 1029   PHURINE 5.5 02/19/2019 1029   GLUCOSEU 500 (A) 02/19/2019 1029   HGBUR NEGATIVE 02/19/2019 [place] 02/19/2019 1029   BILIRUBINUR negative 09/25/2018 [place] 02/19/2019 1029   PROTEINUR NEGATIVE 02/19/2019 1029   UROBILINOGEN 0.2 02/19/2019 1029   NITRITE NEGATIVE 02/19/2019 1029   LEUKOCYTESUR NEGATIVE 02/19/2019 1029  Sepsis Labs: Lactic Acid, Venous    Component Value Date/Time   LATICACIDVEN 1.8 04/23/2019 2115    MICROBIOLOGY: Recent Results (from the past 240 hour(s))  Respiratory Panel by RT PCR (Flu A&B, Covid) - Nasopharyngeal Swab     Status: None   Collection Time: 04/23/19  4:19 AM   Specimen: Nasopharyngeal Swab  Result Value Ref Range Status   SARS Coronavirus 2 by RT PCR NEGATIVE NEGATIVE Final    Comment: (NOTE) SARS-CoV-2 target nucleic acids are NOT DETECTED. The SARS-CoV-2 RNA is generally detectable in upper respiratoy specimens during the acute phase of infection. The lowest concentration of SARS-CoV-2 viral copies this assay can detect is 131 copies/mL. A negative result does not preclude SARS-Cov-2 infection and should not be used as the sole basis for treatment or other patient management decisions. A negative result may occur with  improper specimen collection/handling, submission of specimen other than nasopharyngeal swab, presence of viral mutation(s) within the areas targeted by this assay, and inadequate number of viral copies (<131 copies/mL). A negative result must be combined with  clinical observations, patient history, and epidemiological information. The expected result is Negative. Fact Sheet for Patients:  PinkCheek.be Fact Sheet for Healthcare Providers:  GravelBags.it This test is not yet ap proved or cleared by the [place] FDA and  has been authorized for detection and/or diagnosis of SARS-CoV-2 by FDA under an Emergency Use Authorization (EUA). This EUA will remain  in effect (meaning this test can be used) for the duration of the COVID-19 declaration under Section 564(b)(1) of the Act, 21 U.S.C. section 360bbb-3(b)(1), unless the authorization is terminated or revoked sooner.    Influenza A by PCR NEGATIVE NEGATIVE Final   Influenza B by PCR NEGATIVE NEGATIVE Final    Comment: (NOTE) The Xpert Xpress SARS-CoV-2/FLU/RSV assay is intended as an aid in  the diagnosis of influenza from Nasopharyngeal swab specimens and  should not be used as a sole basis for treatment. Nasal washings and  aspirates are unacceptable for Xpert Xpress SARS-CoV-2/FLU/RSV  testing. Fact Sheet for Patients: PinkCheek.be Fact Sheet for Healthcare Providers: GravelBags.it This test is not yet approved or cleared by the [place] FDA and  has been authorized for detection and/or diagnosis of SARS-CoV-2 by  FDA under an Emergency Use Authorization (EUA). This EUA will remain  in effect (meaning this test can be used) for the duration of the  Covid-19 declaration under Section 564(b)(1) of the Act, 21  U.S.C. section 360bbb-3(b)(1), unless the authorization is  terminated or revoked. Performed at [facility], [place] [address], [place], [place] [address]   Blood Culture (routine x 2)     Status: None (Preliminary result)   Collection Time: 04/23/19  5:17 AM   Specimen: BLOOD  Result Value Ref Range Status   Specimen Description BLOOD LEFT  ANTECUBITAL  Final   Special Requests   Final    BOTTLES DRAWN AEROBIC AND ANAEROBIC Blood Culture adequate volume   Culture   Final    NO GROWTH 2 DAYS Performed at [facility], [place] [address] [place], [place] [address]    Report Status PENDING  Incomplete  Blood Culture (routine x 2)     Status: None (Preliminary result)   Collection Time: 04/23/19  5:30 AM   Specimen: BLOOD  Result Value Ref Range Status   Specimen Description BLOOD RIGHT ANTECUBITAL  Final   Special Requests   Final    BOTTLES DRAWN AEROBIC AND ANAEROBIC  Blood Culture adequate volume   Culture   Final    NO GROWTH 2 DAYS Performed at [facility], [place] [address], [place], [place] [address]    Report Status PENDING  Incomplete  MRSA PCR Screening     Status: None   Collection Time: 04/24/19 10:00 PM   Specimen: Nasal Mucosa; Nasopharyngeal  Result Value Ref Range Status   MRSA by PCR NEGATIVE NEGATIVE Final    Comment:        The GeneXpert MRSA Assay (FDA approved for NASAL specimens only), is one component of a comprehensive MRSA colonization surveillance program. It is not intended to diagnose MRSA infection nor to guide or monitor treatment for MRSA infections. Performed at [facility], [place] [address], [place], [place] [address]     RADIOLOGY STUDIES/RESULTS: No results found.   LOS: 2 days   Oren Binet, MD  [facility]    To contact the attending provider between 7A-7P or the covering provider during after hours 7P-7A, please log into the web site [email or web address] and access using universal [place] password for that web site. If you do not have the password, please call the hospital operator.  04/25/2019, 2:08 PM

## 2019-04-25 NOTE — Progress Notes (Signed)
Patient is tolerating room air well at this time. BIPAP on standby.

## 2019-04-25 NOTE — Progress Notes (Signed)
Physical Therapy Treatment Patient Details Name: James [name] MRN: [id number] DOB: 04-Oct-1960 Today's Date: 04/25/2019    History of Present Illness 59 year old male admitted 04/23/19 with SOB. Patient with multiple admissions in May, July, August, September for acute on chronic respiratory failure associated wtih COPD exacerbation and recurrent PNA. It is thought that his living conditions (non air conditioned log cabin) may be a contributing factor. He was recently weakned on his steroids and reports increasing respiratory difficulty when this happens. Patient is at high risk for withdrawal incuding seizures and DTs due to his chronic Etoh dependence. CIWA protocol. CXR: RLL PNA. Placed on Bipap. PMH: HTN, COPD not on home O2, Etoh dependence, intellectual disability (cannot read or write), PNA     PT Comments    Patient less tremulous than yesterday but still unsteady with mobility. Improvement with use of RW but still requires cues and hands on assist for safe use. Patient's sister, [name], present at start of session. She lives near patient. He has two sisters. Other sister is POA. [name] confirmed that patient lives alone in a one story home with 2 single steps to enter without rails. He has a tub/shower and no DME. She does note that possibly at time of discharge, if patient is discharged home, that she may take him to her home for showers as she has GBs etc but would need a shower chair. [name] did note that patient's current mobility is not his baseline and that he is still unsteady. Patient prefers to discharge home alone but at this time he is not safe to mobilize on his own. Recommend continued skilled PT services and home PT with 24/7 assist vs SNF pending patient's level of support and mobility at time of discharge.    Follow Up Recommendations  Home health PT;SNF;Supervision/Assistance - 24 hour(pending mobility progression and support at time of d/c)     Equipment  Recommendations  Rolling walker with 5" wheels;Other (comment)(shower chair)       Precautions / Restrictions Precautions Precautions: Fall;Other (comment) Precaution Comments: HOB>30 degrees, O2 sat>92% Restrictions Weight Bearing Restrictions: No    Mobility  Bed Mobility  General bed mobility comments: Patient already OOB in recliner chair.  Transfers Overall transfer level: Needs assistance Equipment used: None;Rolling walker (2 wheeled) Transfers: Sit to/from Stand Sit to Stand: Min guard;Supervision         General transfer comment: sit<>stand from recliner chair trial 1 without RW with close supervision/CGA, trial 2: sit<>stand with RW and cues for hand placement  Ambulation/Gait Ambulation/Gait assistance: Min assist;Min guard Gait Distance (Feet): 100 [id number]) Assistive device: None;Rolling walker (2 wheeled) Gait Pattern/deviations: Step-through pattern;Drifts right/left;Staggering right;Staggering left;Shuffle Gait velocity: Cues to decrease speed to increase safety   General Gait Details: Gait trial 1 without AD with minA/contact guard assistance, unsteadiness noted. Trial 2 approx 181ft with RW with cues for obstacle negotiation and contact guard assistance, cues for upright posture.    Balance Overall balance assessment: Needs assistance   Standing balance support: No upper extremity supported Standing balance-Leahy Scale: Fair     Cognition Arousal/Alertness: Awake/alert Behavior During Therapy: WFL for tasks assessed/performed Overall Cognitive Status: History of cognitive impairments - at baseline  General Comments: Patient's sister, [name], present at start of session.         General Comments General comments (skin integrity, edema, etc.): Patient on room air, oxygen saturation down to 90% with ambulation, HR as high as 131 bpm on 2nd gait trial and RR as  high as 40.       Pertinent Vitals/Pain Pain Assessment: No/denies pain(No complaints of  pain, no signs/symptoms of pain)           PT Goals (current goals can now be found in the care plan section) Progress towards PT goals: Progressing toward goals    Frequency    Min 3X/week      PT Plan Current plan remains appropriate       AM-PAC PT "6 Clicks" Mobility   Outcome Measure  Help needed turning from your back to your side while in a flat bed without using bedrails?: None Help needed moving from lying on your back to sitting on the side of a flat bed without using bedrails?: None Help needed moving to and from a bed to a chair (including a wheelchair)?: A Little Help needed standing up from a chair using your arms (e.g., wheelchair or bedside chair)?: A Little Help needed to walk in hospital room?: A Little Help needed climbing 3-5 steps with a railing? : A Little 6 Click Score: 20    End of Session Equipment Utilized During Treatment: Gait belt Activity Tolerance: Patient tolerated treatment well Patient left: in chair;with call bell/phone within reach;with chair alarm set Nurse Communication: Mobility status(via secure chat) PT Visit Diagnosis: Unsteadiness on feet (R26.81);Other abnormalities of gait and mobility (R26.89)     Time: 4315-4008 PT Time Calculation (min) (ACUTE ONLY): 27 min  Charges:  $Gait Training: 23-37 mins                     Birdie Hopes, PT, DPT Acute Rehab [phone] office     Birdie Hopes 04/25/2019, 3:53 PM

## 2019-04-25 NOTE — Progress Notes (Signed)
Pyxis was out of diazepam, so pharmacy sent up 18mL to administer 0.11mL dose. Excess 1.45mL could not be wasted through Pyxis since it was not removed from the Pyxis. Ilsa Iha, RN witnessed the waste of 1.48mL/7.5mg  of diazepam in the sharps container by this RN.

## 2019-04-26 LAB — CBC
HCT: 43.5 % (ref 39.0–52.0)
Hemoglobin: 14.2 g/dL (ref 13.0–17.0)
MCH: 32.9 pg (ref 26.0–34.0)
MCHC: 32.6 g/dL (ref 30.0–36.0)
MCV: 100.9 fL — ABNORMAL HIGH (ref 80.0–100.0)
Platelets: 251 10*3/uL (ref 150–400)
RBC: 4.31 MIL/uL (ref 4.22–5.81)
RDW: 12.3 % (ref 11.5–15.5)
WBC: 16.6 10*3/uL — ABNORMAL HIGH (ref 4.0–10.5)
nRBC: 0 % (ref 0.0–0.2)

## 2019-04-26 LAB — BASIC METABOLIC PANEL
Anion gap: 9 (ref 5–15)
BUN: 19 mg/dL (ref 6–20)
CO2: 27 mmol/L (ref 22–32)
Calcium: 9.2 mg/dL (ref 8.9–10.3)
Chloride: 104 mmol/L (ref 98–111)
Creatinine, Ser: 0.71 mg/dL (ref 0.61–1.24)
GFR calc Af Amer: >60 mL/min (ref >60)
GFR calc non Af Amer: >60 mL/min (ref >60)
Glucose, Bld: 158 mg/dL — ABNORMAL HIGH (ref 70–99)
Potassium: 4.3 mmol/L (ref 3.5–5.1)
Sodium: 140 mmol/L (ref 135–145)

## 2019-04-26 NOTE — Progress Notes (Signed)
Physical Therapy Treatment Patient Details Name: James [name] MRN: [id number] DOB: Apr 21, 1960 Today's Date: 04/26/2019    History of Present Illness 59 year old male admitted 04/23/19 with SOB. Patient with multiple admissions in May, July, August, September for acute on chronic respiratory failure associated wtih COPD exacerbation and recurrent PNA. It is thought that his living conditions (non air conditioned log cabin) may be a contributing factor. He was recently weakned on his steroids and reports increasing respiratory difficulty when this happens. Patient is at high risk for withdrawal incuding seizures and DTs due to his chronic Etoh dependence. CIWA protocol. CXR: RLL PNA. Placed on Bipap. PMH: HTN, COPD not on home O2, Etoh dependence, intellectual disability (cannot read or write), PNA     PT Comments    Patient's mobility is improving although he is still not at baseline level of mobility and is still not independent with mobility. Balance deficits noted. Patient is still at risk for falls. He also demonstrates decreased awareness of his deficits. Anticipate patient's mobility to continue to progress with skilled PT services and as he finishes withdrawing. If patient remains in the hospital another 2-3 days, anticipate he will be able to progress in order to discharge home with home PT. If patient is to discharge today, recommend home with 24/7 assist and home PT. If someone is unable to stay with patient (or patient unable to stay at sister's home), then recommend SNF until patient is back to his baseline of independence and active.    Follow Up Recommendations  Home health PT;Supervision/Assistance - 24 hour;SNF     Equipment Recommendations  Rolling walker with 5" wheels;Other (comment)(shower chair)       Precautions / Restrictions Precautions Precautions: Fall;Other (comment) Precaution Comments: HOB>30 degrees, O2 sat>92% Restrictions Weight Bearing Restrictions: No     Mobility  Bed Mobility Overal bed mobility: Modified Independent       Supine to sit: Modified independent (Device/Increase time)     General bed mobility comments: from long sitting in bed to sitting EOB  Transfers Overall transfer level: Needs assistance Equipment used: None;Rolling walker (2 wheeled) Transfers: Sit to/from Stand Sit to Stand: Supervision;Min guard         General transfer comment: sit>stand from EOB with RW and close supervision/contact guard assistance. Sit<>stand from chair without AD with close supervision/contact guard.   Ambulation/Gait Ambulation/Gait assistance: Min guard;Min assist Gait Distance (Feet): 125 Feet(x2) Assistive device: None;Rolling walker (2 wheeled) Gait Pattern/deviations: Step-through pattern;Scissoring;Shuffle;Narrow base of support;Drifts right/left;Staggering right;Staggering left Gait velocity: varying gait speeds especially with RW   General Gait Details: Gait trial 1 wiht RW with contact guard assistance, path deviations especially when patient distracted or with head turns. Patient requires cues for obstacle negotiation with use of RW. Gait trial 2 without AD with contact guard/occ minA, scissoring approx 10% of the time, decreased R foot clearance, mild staggering.    Stairs Stairs: Yes Stairs assistance: Min guard;Min assist Stair Management: With walker Number of Stairs: 1 General stair comments: Education and cues on use of RW for negotiating single step with RW as "railings."      Balance Overall balance assessment: Needs assistance Sitting-balance support: Feet supported Sitting balance-Leahy Scale: Good     Standing balance support: No upper extremity supported Standing balance-Leahy Scale: (Good-) Standing balance comment: positive Romber, able to reach outside BOS with supervision, able to pick up item from floor with supervision    [place]berg - Eyes Opened: (negative) [place]berg - Eyes Closed:  (  positive)     Cognition Arousal/Alertness: Awake/alert Behavior During Therapy: WFL for tasks assessed/performed Overall Cognitive Status: History of cognitive impairments - at baseline        General Comments General comments (skin integrity, edema, etc.): Patient on room air. Oxygen saturation 92% or better during mobility. Patient's balance and gait quality are improving but patient is still not at baseline and is still not independent with mobility.      Pertinent Vitals/Pain Pain Assessment: No/denies pain(No complaints of pain, no signs/symptoms of pain.)           PT Goals (current goals can now be found in the care plan section) Progress towards PT goals: Progressing toward goals    Frequency    Min 3X/week      PT Plan Current plan remains appropriate       AM-PAC PT "6 Clicks" Mobility   Outcome Measure  Help needed turning from your back to your side while in a flat bed without using bedrails?: None Help needed moving from lying on your back to sitting on the side of a flat bed without using bedrails?: None Help needed moving to and from a bed to a chair (including a wheelchair)?: A Little Help needed standing up from a chair using your arms (e.g., wheelchair or bedside chair)?: A Little Help needed to walk in hospital room?: A Little Help needed climbing 3-5 steps with a railing? : A Little 6 Click Score: 20    End of Session Equipment Utilized During Treatment: Gait belt Activity Tolerance: Patient tolerated treatment well Patient left: in chair;with call bell/phone within reach;with chair alarm set Nurse Communication: Mobility status PT Visit Diagnosis: Unsteadiness on feet (R26.81);Other abnormalities of gait and mobility (R26.89)     Time: 5320-2334 PT Time Calculation (min) (ACUTE ONLY): 23 min  Charges:  $Gait Training: 23-37 mins                     Birdie Hopes, PT, DPT Acute Rehab [phone] office    Birdie Hopes 04/26/2019, 9:38 AM

## 2019-04-26 NOTE — TOC Initial Note (Addendum)
Transition of Care [facility]) - Initial/Assessment Note    Patient Details  Name: James [name] MRN: [id number] Date of Birth: 1960-07-08  Transition of Care [facility]) CM/SW Contact:    Benard Halsted, LCSW Phone Number: 04/26/2019, 12:06 PM  Clinical Narrative:                 CSW received consult regarding HHPT/SNF recommendation. Patient very pleasant upon entry and reported feeling better. CSW discussed SNF placement with him to see if he would be interested in going in light of him requesting to return home alone yesterday when his sister was present. He reported that he does not want to go to a facility and that he would like to return home. CSW discussed ETOH use and offered resources. Patient stated that he does not believe it is an issue and he declined resources. He provided permission for CSW to contact his sister [name] to discuss plan. MSW intern to follow up with [name].     Barriers to Discharge: Family Issues   Patient Goals and CMS Choice Patient states their goals for this hospitalization and ongoing recovery are:: Return home CMS Medicare.gov Compare Post Acute Care list provided to:: Patient(and HCPOA [name]) Choice offered to / list presented to : Sibling, Patient  Expected Discharge Plan and Services   In-house Referral: Clinical Social Work   Post Acute Care Choice: Home Health Living arrangements for the past 2 months: [place]                                      Prior Living Arrangements/Services Living arrangements for the past 2 months: Single Family Home Lives with:: Self Patient language and need for interpreter reviewed:: Yes Do you feel safe going back to the place where you live?: Yes      Need for Family Participation in Patient Care: Yes (Comment)(Cognitive delays) Care giver support system in place?: Yes (comment)   Criminal Activity/Legal Involvement Pertinent to Current Situation/Hospitalization: No - Comment as needed  Activities  of Daily Living Home Assistive Devices/Equipment: None ADL Screening (condition at time of admission) Patient's cognitive ability adequate to safely complete daily activities?: Yes Is the patient deaf or have difficulty hearing?: No Does the patient have difficulty seeing, even when wearing glasses/contacts?: No Does the patient have difficulty concentrating, remembering, or making decisions?: No Patient able to express need for assistance with ADLs?: Yes Does the patient have difficulty dressing or bathing?: No Independently performs ADLs?: Yes (appropriate for developmental age) Does the patient have difficulty walking or climbing stairs?: No Weakness of Legs: Both Weakness of Arms/Hands: Both  Permission Sought/Granted Permission sought to share information with : Facility Sport and exercise psychologist, Family Supports Permission granted to share information with : Yes, Verbal Permission Granted  Share Information with NAME: [name]/[name]     Permission granted to share info w Relationship: Sisters  Permission granted to share info w Contact Information: [phone]  Emotional Assessment Appearance:: Appears older than stated age Attitude/Demeanor/Rapport: Gracious Affect (typically observed): Accepting, Appropriate, Pleasant Orientation: : Oriented to Self, Oriented to Place, Oriented to  Time Alcohol / Substance Use: Alcohol Use Psych Involvement: No (comment)  Admission diagnosis:  COPD exacerbation (HCC) [J44.1] Acute respiratory failure with hypoxia (HCC) [J96.01] Acute on chronic respiratory failure with hypoxia ([place]) [J96.21] Community acquired pneumonia of right lower lobe of lung [J18.9] Patient Active Problem List   Diagnosis Date Noted  .  Acute on chronic respiratory failure with hypoxia and hypercapnia ([place]) 04/23/2019  . Intellectual disability 04/23/2019  . 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 Marijuana abuse 08/09/2018  . Community acquired pneumonia of right lower lobe of  lung 05/17/2018  . COPD exacerbation ([place]) 02/16/2018  . Hypertension 05/17/2016  . Low back pain radiating to both legs 04/18/2016  . History of tobacco abuse 04/06/2016  . Alcohol dependence syndrome ([place]) 04/06/2016  . COPD mixed type ([place]) 03/26/2016   PCP:  [name], MD Pharmacy:   [facility], [address] [place] [address] Phone: [phone] Fax: [facility], [place] [address] [place] [address] Phone: [phone] Fax: [phone]     Social Determinants of Health (SDOH) Interventions    Readmission Risk Interventions Readmission Risk Prevention Plan 04/26/2019  Transportation Screening Complete  PCP or Specialist Appt within 3-5 Days Complete  HRI or [place] Complete  Social Work Consult for [place] Planning/Counseling Complete  Palliative Care Screening Not Applicable  Medication Review Press photographer) Complete  Some recent data might be hidden

## 2019-04-26 NOTE — TOC Progression Note (Addendum)
Transition of Care [facility]) - Progression Note    Patient Details  Name: James [name] MRN: [id number] Date of Birth: 1960-11-04  Transition of Care [facility]) CM/SW [place], [place] Work Phone Number: 04/26/2019, 2:01 PM  Clinical Narrative:    1:46 PM- CSW received call back from Aldine Contes at [phone]). [name] informed CSW that pt would not progress or benefit from home health services at this time. [name] also informed CSW that pt declined going home with her to [place], [place], and that pt wanted to just go home with no services at this time. CSW made RNCM aware. [name] stated that she is unable to pick pt up from hospital due to being so far away, and [name] (other sister) is unable to pick pt up from hospital due to being sick. [name] said to call [facility] to pick pt up from hospital if possible. All other questions and concerns were answered.      Barriers to Discharge: Family Issues  Expected Discharge Plan and Services   In-house Referral: Clinical Social Work   Post Acute Care Choice: [place] arrangements for the past 2 months: Single Family Home                                       Social Determinants of Health (SDOH) Interventions    Readmission Risk Interventions Readmission Risk Prevention Plan 04/26/2019  Transportation Screening Complete  PCP or Specialist Appt within 3-5 Days Complete  HRI or [place] Complete  Social Work Consult for [place] Planning/Counseling Complete  Palliative Care Screening Not Applicable  Medication Review Press photographer) Complete  Some recent data might be hidden

## 2019-04-26 NOTE — Progress Notes (Signed)
Occupational Therapy Treatment Patient Details Name: James [name] MRN: [id number] DOB: 1960/10/06 Today's Date: 04/26/2019    History of present illness 59 year old male admitted 04/23/19 with SOB. Patient with multiple admissions in May, July, August, September for acute on chronic respiratory failure associated wtih COPD exacerbation and recurrent PNA. It is thought that his living conditions (non air conditioned log cabin) may be a contributing factor. He was recently weakned on his steroids and reports increasing respiratory difficulty when this happens. Patient is at high risk for withdrawal incuding seizures and DTs due to his chronic Etoh dependence. CIWA protocol. CXR: RLL PNA. Placed on Bipap. PMH: HTN, COPD not on home O2, Etoh dependence, intellectual disability (cannot read or write), PNA    OT comments  Pt progressing well with OT goals, pleasant and motivated to work with therapy. Pt supervision for sit to stand transfer with RW (did require 2 attempts for successful stand), min guard for short distance mobility and stand pivot with RW to ensure safety. Pt appears overall more steady on feet today using RW. Encouraged pt to use RW at home initially to decrease fall risk. Pt stood at sink for > 5 minutes with RW to complete various grooming tasks, reaching outside of BOS and completing bimanual tasks without UE support, no overt LOB. If pt continues to progress with stability on feet during ADLs and mobility, can progress to home with [facility]. Will continue to follow acutely and update recommendations as needed.    Follow Up Recommendations  Home health OT;SNF(SNF if family support not available at home initially)    Equipment Recommendations  Other (comment)(RW)    Recommendations for Other Services      Precautions / Restrictions Precautions Precautions: Fall Restrictions Weight Bearing Restrictions: No       Mobility Bed Mobility               General bed mobility  comments: up in recliner   Transfers Overall transfer level: Needs assistance Equipment used: Rolling walker (2 wheeled) Transfers: Sit to/from [facility] Sit to Stand: Supervision Stand pivot transfers: Min guard       General transfer comment: sit to stand from recliner supervision (did require 2 attempts by pt), min guard for safety in turning with RW    Balance Overall balance assessment: Needs assistance Sitting-balance support: Feet supported Sitting balance-Leahy Scale: Good     Standing balance support: No upper extremity supported Standing balance-Leahy Scale: Fair                             ADL either performed or assessed with clinical judgement   ADL Overall ADL's : Needs assistance/impaired     Grooming: Wash/dry hands;Wash/dry face;Oral care;Brushing hair;Min guard;Standing Grooming Details (indicate cue type and reason): min guard progressing to supervision at times standing at sink with RW for various grooming tasks standing > 5 min              Lower Body Dressing: Supervision/safety;Sitting/lateral leans Lower Body Dressing Details (indicate cue type and reason): Supervision to adjust socks sitting in recliner chair              Functional mobility during ADLs: Min guard;Rolling walker;Cueing for sequencing;Cueing for safety General ADL Comments: Pt with improving steadiness on feet with RW use, decreased tremors      Vision       Perception     Praxis  Cognition Arousal/Alertness: Awake/alert Behavior During Therapy: WFL for tasks assessed/performed Overall Cognitive Status: History of cognitive impairments - at baseline                                          Exercises     Shoulder Instructions       General Comments Pt on RA, VSS     Pertinent Vitals/ Pain       Pain Assessment: No/denies pain  Home Living                                           Prior Functioning/Environment              Frequency  Min 3X/week        Progress Toward Goals  OT Goals(current goals can now be found in the care plan section)  Progress towards OT goals: Progressing toward goals  Acute Rehab OT Goals Patient Stated Goal: be able to go home soon OT Goal Formulation: With patient Time For Goal Achievement: 05/08/19 Potential to Achieve Goals: Good ADL Goals Pt Will Perform Grooming: with supervision;standing Pt Will Perform Upper Body Bathing: with modified independence;sitting Pt Will Perform Lower Body Bathing: with supervision;sitting/lateral leans;sit to/from stand Pt Will Perform Lower Body Dressing: with supervision;sit to/from stand;sitting/lateral leans Pt Will Transfer to Toilet: with supervision;ambulating;regular height toilet Pt Will Perform Toileting - Clothing Manipulation and hygiene: with supervision;sitting/lateral leans;sit to/from stand  Plan Discharge plan remains appropriate    Co-evaluation                 AM-PAC OT "6 Clicks" Daily Activity     Outcome Measure   Help from another person eating meals?: None Help from another person taking care of personal grooming?: A Little Help from another person toileting, which includes using toliet, bedpan, or urinal?: A Little Help from another person bathing (including washing, rinsing, drying)?: A Little Help from another person to put on and taking off regular upper body clothing?: A Little Help from another person to put on and taking off regular lower body clothing?: A Little 6 Click Score: 19    End of Session Equipment Utilized During Treatment: Gait belt;Rolling walker  OT Visit Diagnosis: Unsteadiness on feet (R26.81);Other abnormalities of gait and mobility (R26.89);Muscle weakness (generalized) (M62.81)   Activity Tolerance Patient tolerated treatment well   Patient Left in bed;with call bell/phone within reach;with bed alarm set   Nurse  Communication Mobility status        Time: 6468-0321 OT Time Calculation (min): 23 min  Charges: OT General Charges $OT Visit: 1 Visit OT Treatments $Self Care/Home Management : 23-37 mins  Layla Maw, OTR/L   Layla Maw 04/26/2019, 1:14 PM

## 2019-04-26 NOTE — Progress Notes (Addendum)
PROGRESS NOTE        PATIENT DETAILS Name: James [name] Age: 59 y.o. Sex: male Date of Birth: 06/14/1960 Admit Date: 04/23/2019 Admitting Physician Karmen Bongo, MD MBT:DHRCBU, Burnett Harry, MD  Brief Narrative: Patient is a 59 y.o. male with history of COPD, intellectual disability, HTN who presented with shortness of breath-found to have acute hypoxic respiratory failure secondary to pneumonia and COPD exacerbation.  Patient initially required BiPAP on admission.  See below for further details.  Significant events: 4/19>> admit to [facility] for severe hypoxemia requiring BiPAP 4/20>> liberated from BiPAP  Antimicrobial therapy: Rocephin 4/18>> Zithromax 4/18>>4/22  Microbiology data: Blood culture: 4/19>> negative  Procedures : None  Consults: None  DVT Prophylaxis : Prophylactic Lovenox   Subjective: Feels better-just titrated to room air early this morning.  Still coughing.  Assessment/Plan: Acute hypoxic/hypercarbic respiratory failure secondary to COPD exacerbation and pneumonia: Improved-initially required BiPAP on admission-titrated off BiPAP on 4/20.  Now on room air with stable O2 saturations.  Stop Zithromax-continue Rocephin for another day-start tapering steroids-continue bronchodilators.   Note-appears to be on chronic steroids per H&P-we will need to place back on prednisone as outlined in H&P when not felt to require IV steroids.  HTN: BP stable-continue losartan  EtOH abuse: No signs of withdrawal-continue Ativan per protocol.  Per patient's sister-he drinks around 2-3 bottles of beer on a daily basis.  Intellectual disability: Spoke at length with patient's sister ([name]) today-patient is a illiterate-and has been living on his own for all his life. His other sister lives a mile away and checks on him daily. Sister acknowledges poor living conditions-but apparently this is the way that patient has lived all his life-and chooses to live  this way. Patient does odd jobs and sustains a livelihood. Patient is currently managing all his finances. [name] does acknowledge that patient will not let any home health services come to his house-he will not go to SNF. Difficult situation-but even though patient is an illiterate/intellectual disability-he understand that he is somewhat deconditioned from his acute illness-and is in need of some help-he does not want therapy services-he understands the risk of falls from being weak. He is currently contemplating whether he wants to go to [name]'s place in [place] for a few weeks so he can rest/recuperate before going back to his prior living arrangement/condition.  Diet: Diet Order            Diet Heart Room service appropriate? No; Fluid consistency: Thin  Diet effective now               Code Status: Full code   Family Communication: Sister ([name])-tel [phone] the phone on 4/22  Disposition Plan: Probably Home 4/23  Barriers to Discharge: PNA requiring IV antibiotics, hypoxia requiring oxygen supplementation  Antimicrobial agents: Anti-infectives (From admission, onward)   Start     Dose/Rate Route Frequency Ordered Stop   04/23/19 0500  cefTRIAXone (ROCEPHIN) 2 g in sodium chloride 0.9 % 100 mL IVPB     2 g 200 mL/hr over 30 Minutes Intravenous Every 24 hours 04/23/19 0459     04/23/19 0500  azithromycin (ZITHROMAX) 500 mg in sodium chloride 0.9 % 250 mL IVPB     500 mg 250 mL/hr over 60 Minutes Intravenous Every 24 hours 04/23/19 0459         Time spent:  25 minutes-Greater than 50% of this time was spent in counseling, explanation of diagnosis, planning of further management, and coordination of care.  MEDICATIONS: Scheduled Meds: . budesonide (PULMICORT) nebulizer solution  0.25 mg Nebulization BID  . diazepam  2.5 mg Intravenous Q6H  . docusate sodium  100 mg Oral BID  . enoxaparin (LOVENOX) injection  40 mg Subcutaneous Q24H  . feeding supplement (ENSURE  ENLIVE)  237 mL Oral BID BM  . folic acid  1 mg Oral Daily  . losartan  100 mg Oral Daily  . methylPREDNISolone (SOLU-MEDROL) injection  40 mg Intravenous Q12H  . multivitamin with minerals  1 tablet Oral Daily  . sodium chloride flush  3 mL Intravenous Q12H  . thiamine  100 mg Oral Daily   Or  . thiamine  100 mg Intravenous Daily   Continuous Infusions: . sodium chloride 10 mL/hr at 04/26/19 0546  . azithromycin Stopped (04/26/19 0546)  . cefTRIAXone (ROCEPHIN)  IV 200 mL/hr at 04/26/19 0644   PRN Meds:.acetaminophen **OR** acetaminophen, albuterol, bisacodyl, hydrALAZINE, HYDROcodone-acetaminophen, morphine injection, ondansetron **OR** ondansetron (ZOFRAN) IV, polyethylene glycol   PHYSICAL EXAM: Vital signs: Vitals:   04/26/19 0742 04/26/19 0800 04/26/19 0958 04/26/19 1200  BP:  137/88  (!) 142/96  Pulse: 71 71  85  Resp:  19  (!) 24  Temp:  97.7 F (36.5 C)  97.7 F (36.5 C)  TempSrc:  Oral  Oral  SpO2:  95% 95% 93%  Weight:      Height:       Filed Weights   04/24/19 0314  Weight: 83.2 kg   Body mass index is 27.09 kg/m.   Gen Exam:Alert awake-not in any distress HEENT:atraumatic, normocephalic Chest: B/L clear to auscultation anteriorly CVS:S1S2 regular Abdomen:soft non tender, non distended Extremities:no edema Neurology: Non focal Skin: no rash  I have personally reviewed following labs and imaging studies  LABORATORY DATA: CBC: Recent Labs  Lab 04/23/19 0416 04/23/19 0857 04/24/19 0409 04/25/19 0412 04/26/19 0645  WBC 11.8*  --  17.4* 15.8* 16.6*  NEUTROABS 7.1  --  16.2*  --   --   HGB 15.7 14.6 13.3 14.6 14.2  HCT 48.3 43.0 41.1 45.2 43.5  MCV 101.0*  --  101.2* 101.6* 100.9*  PLT 325  --  262 276 616    Basic Metabolic Panel: Recent Labs  Lab 04/23/19 0416 04/23/19 0857 04/24/19 0409 04/25/19 0412 04/26/19 0645  NA 144 139 142 141 140  K 4.1 4.1 4.2 4.4 4.3  CL 101  --  106 103 104  CO2 25  --  28 28 27   GLUCOSE 145*  --   145* 152* 158*  BUN 13  --  13 21* 19  CREATININE 0.89  --  0.66 0.77 0.71  CALCIUM 9.7  --  8.8* 9.5 9.2    GFR: Estimated Creatinine Clearance: 101.9 mL/min (by C-G formula based on SCr of 0.71 mg/dL).  Liver Function Tests: Recent Labs  Lab 04/23/19 1037  AST 28  ALT 26  ALKPHOS 34*  BILITOT 0.5  PROT 5.9*  ALBUMIN 3.4*   No results for input(s): LIPASE, AMYLASE in the last 168 hours. No results for input(s): AMMONIA in the last 168 hours.  Coagulation Profile: No results for input(s): INR, PROTIME in the last 168 hours.  Cardiac Enzymes: No results for input(s): CKTOTAL, CKMB, CKMBINDEX, TROPONINI in the last 168 hours.  BNP (last 3 results) No results for input(s): PROBNP in the last 8760 hours.  Lipid Profile: No results for input(s): CHOL, HDL, LDLCALC, TRIG, CHOLHDL, LDLDIRECT in the last 72 hours.  Thyroid Function Tests: No results for input(s): TSH, T4TOTAL, FREET4, T3FREE, THYROIDAB in the last 72 hours.  Anemia Panel: No results for input(s): VITAMINB12, FOLATE, FERRITIN, TIBC, IRON, RETICCTPCT in the last 72 hours.  Urine analysis:    Component Value Date/Time   COLORURINE YELLOW 09/12/2018 0415   APPEARANCEUR CLEAR 09/12/2018 0415   LABSPEC >=1.030 02/19/2019 1029   PHURINE 5.5 02/19/2019 1029   GLUCOSEU 500 (A) 02/19/2019 1029   HGBUR NEGATIVE 02/19/2019 1029   BILIRUBINUR NEGATIVE 02/19/2019 1029   BILIRUBINUR negative 09/25/2018 1432   KETONESUR NEGATIVE 02/19/2019 1029   PROTEINUR NEGATIVE 02/19/2019 1029   UROBILINOGEN 0.2 02/19/2019 1029   NITRITE NEGATIVE 02/19/2019 1029   LEUKOCYTESUR NEGATIVE 02/19/2019 1029    Sepsis Labs: Lactic Acid, Venous    Component Value Date/Time   LATICACIDVEN 1.8 04/23/2019 2115    MICROBIOLOGY: Recent Results (from the past 240 hour(s))  Respiratory Panel by RT PCR (Flu A&B, Covid) - Nasopharyngeal Swab     Status: None   Collection Time: 04/23/19  4:19 AM   Specimen: Nasopharyngeal Swab    Result Value Ref Range Status   SARS Coronavirus 2 by RT PCR NEGATIVE NEGATIVE Final    Comment: (NOTE) SARS-CoV-2 target nucleic acids are NOT DETECTED. The SARS-CoV-2 RNA is generally detectable in upper respiratoy specimens during the acute phase of infection. The lowest concentration of SARS-CoV-2 viral copies this assay can detect is 131 copies/mL. A negative result does not preclude SARS-Cov-2 infection and should not be used as the sole basis for treatment or other patient management decisions. A negative result may occur with  improper specimen collection/handling, submission of specimen other than nasopharyngeal swab, presence of viral mutation(s) within the areas targeted by this assay, and inadequate number of viral copies (<131 copies/mL). A negative result must be combined with clinical observations, patient history, and epidemiological information. The expected result is Negative. Fact Sheet for Patients:  PinkCheek.be Fact Sheet for Healthcare Providers:  GravelBags.it This test is not yet ap proved or cleared by the [place] FDA and  has been authorized for detection and/or diagnosis of SARS-CoV-2 by FDA under an Emergency Use Authorization (EUA). This EUA will remain  in effect (meaning this test can be used) for the duration of the COVID-19 declaration under Section 564(b)(1) of the Act, 21 U.S.C. section 360bbb-3(b)(1), unless the authorization is terminated or revoked sooner.    Influenza A by PCR NEGATIVE NEGATIVE Final   Influenza B by PCR NEGATIVE NEGATIVE Final    Comment: (NOTE) The Xpert Xpress SARS-CoV-2/FLU/RSV assay is intended as an aid in  the diagnosis of influenza from Nasopharyngeal swab specimens and  should not be used as a sole basis for treatment. Nasal washings and  aspirates are unacceptable for Xpert Xpress SARS-CoV-2/FLU/RSV  testing. Fact Sheet for  Patients: PinkCheek.be Fact Sheet for Healthcare Providers: GravelBags.it This test is not yet approved or cleared by the [place] FDA and  has been authorized for detection and/or diagnosis of SARS-CoV-2 by  FDA under an Emergency Use Authorization (EUA). This EUA will remain  in effect (meaning this test can be used) for the duration of the  Covid-19 declaration under Section 564(b)(1) of the Act, 21  U.S.C. section 360bbb-3(b)(1), unless the authorization is  terminated or revoked. Performed at [facility], [place] [address], [place], [place] [address]   Blood Culture (routine x  2)     Status: None (Preliminary result)   Collection Time: 04/23/19  5:17 AM   Specimen: BLOOD  Result Value Ref Range Status   Specimen Description BLOOD LEFT ANTECUBITAL  Final   Special Requests   Final    BOTTLES DRAWN AEROBIC AND ANAEROBIC Blood Culture adequate volume   Culture   Final    NO GROWTH 3 DAYS Performed at [facility], [address] [place], [place] [address]    Report Status PENDING  Incomplete  Blood Culture (routine x 2)     Status: None (Preliminary result)   Collection Time: 04/23/19  5:30 AM   Specimen: BLOOD  Result Value Ref Range Status   Specimen Description BLOOD RIGHT ANTECUBITAL  Final   Special Requests   Final    BOTTLES DRAWN AEROBIC AND ANAEROBIC Blood Culture adequate volume   Culture   Final    NO GROWTH 3 DAYS Performed at [facility], [name] [address] [place], [place] [address]    Report Status PENDING  Incomplete  MRSA PCR Screening     Status: None   Collection Time: 04/24/19 10:00 PM   Specimen: Nasal Mucosa; Nasopharyngeal  Result Value Ref Range Status   MRSA by PCR NEGATIVE NEGATIVE Final    Comment:        The GeneXpert MRSA Assay (FDA approved for NASAL specimens only), is one component of a comprehensive MRSA colonization surveillance program. It is not intended  to diagnose MRSA infection nor to guide or monitor treatment for MRSA infections. Performed at [facility], [place] [address] [place], [place] [address]     RADIOLOGY STUDIES/RESULTS: No results found.   LOS: 3 days   Oren Binet, MD  [facility]    To contact the attending provider between 7A-7P or the covering provider during after hours 7P-7A, please log into the web site [email or web address] and access using universal [place] password for that web site. If you do not have the password, please call the hospital operator.  04/26/2019, 2:56 PM

## 2019-04-26 NOTE — TOC Progression Note (Addendum)
Transition of Care [facility] - Progression Note    Patient Details  Name: James [name] MRN: [id number] Date of Birth: 01-10-1960  Transition [facility]) CM/SW [place], [place] Work Phone Number: 04/26/2019, 12:09 PM  Clinical Narrative:    CSW spoke with sister [name] about discharge plans concerning pt. [name] stated that [name], pt's other sister is pt's POA and that they completed POA at the hospital earlier this week. [name] said to contact her.  CSW contacted [name] about pt's discharge plans. CSW stated that PT has recommended SNF and that pt would positively benefit from rehab. Pt stated earlier that he was not going to a rehab facility per LCSW. [name] said that her brother is very complicated and that he cannot live by himself and cannot take care of himself at home.[name] said that he does not have a stable income and no SSI. With this information, CSW told her that he would not be eligible for SNF since he does not have a source of income to pay for the rehab (Pt also would not be eligible for LOG since he is walking >364ft) . [name] acknowledged this but is still worried about pt living by himself.  [name] wants him to come live with her so he will be taken care of. She lives in [place], [place]. CSW provided emotional support and said that this is a conversation that will have to be had with the patient. CSW presented that home health may be an option, but we would have to see if any providers would take his Medicaid. [name] said that home health may not be a good option for him given his daily living procedures at home. [name] said that she would contact patient about living arrangements and home-health options. CSW said that pt is stable and ready to discharge today or tomorrow. CSW/LCSW and toc team will continue to follow.      Barriers to Discharge: Family Issues  Expected Discharge Plan and Services   In-house Referral: Clinical Social Work   Post Acute Care  Choice: [place] arrangements for the past 2 months: Single Family Home                                       Social Determinants of Health (SDOH) Interventions    Readmission Risk Interventions Readmission Risk Prevention Plan 04/26/2019  Transportation Screening Complete  PCP or Specialist Appt within 3-5 Days Complete  HRI or [place] Complete  Social Work Consult for [place] Planning/Counseling Complete  Palliative Care Screening Not Applicable  Medication Review Press photographer) Complete  Some recent data might be hidden

## 2019-04-27 MED ORDER — CEFDINIR 300 MG PO CAPS: 300.0000 mg | ORAL_CAPSULE | Freq: Two times a day (BID) | ORAL | 0 refills | Status: DC

## 2019-04-27 MED ORDER — ENSURE ENLIVE PO LIQD: 237.0000 mL | Freq: Two times a day (BID) | ORAL | 0 refills | Status: AC

## 2019-04-27 MED ORDER — ALBUTEROL SULFATE HFA 108 (90 BASE) MCG/ACT IN AERS: 2.0000 | INHALATION_SPRAY | Freq: Four times a day (QID) | RESPIRATORY_TRACT | 0 refills | Status: DC | PRN

## 2019-04-27 MED ORDER — MOMETASONE FURO-FORMOTEROL FUM 200-5 MCG/ACT IN AERO: 2.0000 | INHALATION_SPRAY | Freq: Two times a day (BID) | RESPIRATORY_TRACT | 0 refills | Status: DC

## 2019-04-27 MED ORDER — LOSARTAN POTASSIUM 100 MG PO TABS: 100.0000 mg | ORAL_TABLET | Freq: Every day | ORAL | 0 refills | Status: DC

## 2019-04-27 MED ORDER — FOLIC ACID 1 MG PO TABS: 1.0000 mg | ORAL_TABLET | Freq: Every day | ORAL | 0 refills | Status: DC

## 2019-04-27 MED ORDER — SPIRIVA HANDIHALER 18 MCG IN CAPS: 18.0000 ug | ORAL_CAPSULE | Freq: Every day | RESPIRATORY_TRACT | 0 refills | Status: DC

## 2019-04-27 MED ORDER — PREDNISONE 10 MG PO TABS: ORAL_TABLET | ORAL | 0 refills | Status: DC

## 2019-04-27 MED ORDER — ALBUTEROL SULFATE (2.5 MG/3ML) 0.083% IN NEBU: 2.5000 mg | INHALATION_SOLUTION | Freq: Four times a day (QID) | RESPIRATORY_TRACT | 0 refills | Status: DC | PRN

## 2019-04-27 MED ORDER — CEFDINIR 300 MG PO CAPS
300.0000 mg | ORAL_CAPSULE | Freq: Two times a day (BID) | ORAL | 0 refills | Status: DC
Start: 2019-04-27 — End: 2019-05-01

## 2019-04-27 MED FILL — FOLIC ACID 1 MG TABS: 1 | 90 days supply | Qty: 90 | Fill #0

## 2019-04-27 MED FILL — SPIRIVA 18 MCG CP-HANDIHALE: 18 | 30 days supply | Qty: 30 | Fill #0

## 2019-04-27 MED FILL — CEFDINIR 300 MG CAPSULE: 300 | 2 days supply | Qty: 4 | Fill #0

## 2019-04-27 MED FILL — DULERA 200 MCG/5 MCG INH: 200-5 | 30 days supply | Qty: 13 | Fill #0

## 2019-04-27 MED FILL — ALBUTEROL SULFATE HFA 108 (: 108 (90 BAS | 25 days supply | Qty: 18 | Fill #0

## 2019-04-27 MED FILL — ALBUTEROL SUL 2.5 MG/3 ML S: (2.5 MG/3ML | 15 days supply | Qty: 180 | Fill #0

## 2019-04-27 MED FILL — predniSONE 10 MG TABS: 10 | 30 days supply | Qty: 60 | Fill #0

## 2019-04-27 MED FILL — LOSARTAN POTASSIUM 100 MG T: 100 | 30 days supply | Qty: 30 | Fill #0

## 2019-04-27 NOTE — Progress Notes (Signed)
Spoke with patient's sister [name] and advised patient has medications from the pharmacy and his paperwork with instructions on how to take the medications are in his black bag.  She is to call us once she's at the hospital.

## 2019-04-27 NOTE — Discharge Summary (Addendum)
PATIENT DETAILS Name: James [name] Age: 59 y.o. Sex: male Date of Birth: 03-26-1960 MRN: [id number]. Admitting Physician: Karmen Bongo, MD JAS:[id number], Burnett Harry, MD  Admit Date: 04/23/2019 Discharge date: 04/27/2019  Recommendations for Outpatient Follow-up:  1. Follow up with PCP in 1-2 weeks 2. Please obtain CMP/CBC in one week 3. Please repeat chest x-ray in the next 6 to 8 weeks to document resolution of infiltrates, if not may require further work-up.  Admitted From:  Home  Disposition: [place]: No  Equipment/Devices: None  Discharge Condition: Stable  CODE STATUS: FULL CODE  Diet recommendation:  Diet Order            Diet - low sodium heart healthy        Diet Heart Room service appropriate? No; Fluid consistency: Thin  Diet effective now              Brief Narrative: Patient is a 59 y.o. male with history of COPD, intellectual disability, HTN who presented with shortness of breath-found to have acute hypoxic respiratory failure secondary to pneumonia and COPD exacerbation.  Patient initially required BiPAP on admission.  See below for further details.  Significant events: 4/19>> admit to [facility] for severe hypoxemia requiring BiPAP 4/20>> liberated from BiPAP  Antimicrobial therapy: Rocephin 4/18>>4/23 Zithromax 4/18>>4/22  Microbiology data: Blood culture: 4/19>> negative  Procedures : None  Consults: None  Brief Hospital Course: Acute hypoxic/hypercarbic respiratory failure secondary to COPD exacerbation and pneumonia: Improved-initially required BiPAP on admission-titrated off BiPAP on 4/20.  Now on room air with stable O2 saturations.  Feels much better-treated with Rocephin and Zithromax-we will transition to [facility] for a few more days on discharge.  He will resume his usual inhaler regimen-he will be placed on 20 mg of p.o. prednisone until he is seen by [name].  PCP to repeat a chest x-ray in 6 to 8 weeks to  document resolution of infiltrates-if not he may require further work-up to rule out underlying malignancy.  (Per patient's sister-patient probably has been taking more prednisone than he should be-he is unable to read-and is not able to follow directions)  HTN: BP stable-continue losartan  EtOH abuse: No signs of withdrawal-managed with  Ativan per protocol.  Per patient's sister-he drinks around 2-3 bottles of beer on a daily basis.  Intellectual disability: Spoke at length with patient's sister ([name]) today-patient is a illiterate-and has been living on his own for all his life. His other sister lives a mile away and checks on him daily. Sister acknowledges poor living conditions-but apparently this is the way that patient has lived all his life-and chooses to live this way. Patient does odd jobs and sustains a livelihood. Patient is currently managing all his finances. [name] does acknowledge that patient will not let any home health services come to his house-he will not go to SNF. Difficult situation-but even though patient is an illiterate/intellectual disability-he understand that he is somewhat deconditioned from his acute illness-and is in need of some help-he does not want therapy services-he understands the risk of falls from being weak.  This MD had numerous discussions with him regarding his appropriate disposition-all he wants to do is go home-his sister-[name] wants to have him come to her place in [place], [place] for a few weeks-however the patient really does not want to go there-and wants to go home.  I have discussed at length with social worker as well-at this point he is being discharged home  at his own request.  His family will keep an eye on him.   Nutrition Problem: Nutrition Problem: Increased nutrient needs Etiology: chronic illness(COPD) Signs/Symptoms: estimated needs Interventions: Ensure Enlive (each supplement provides 350kcal and 20 grams of protein)  Discharge  Diagnoses:  Principal Problem:   Acute on chronic respiratory failure with hypoxia and hypercapnia (HCC) Active Problems:   Hypertension   COPD exacerbation ([place])   Community acquired pneumonia of right lower lobe of lung   Marijuana abuse   Intellectual disability   Discharge Instructions:  Activity:  As tolerated   Discharge Instructions    Call MD for:  difficulty breathing, headache or visual disturbances   Complete by: As directed    Call MD for:  extreme fatigue   Complete by: As directed    Call MD for:  persistant dizziness or light-headedness   Complete by: As directed    Call MD for:  persistant nausea and vomiting   Complete by: As directed    Diet - low sodium heart healthy   Complete by: As directed    Increase activity slowly   Complete by: As directed      Allergies as of 04/27/2019      Reactions   Gabapentin Other (See Comments)   hallucinations      Medication List    TAKE these medications   AeroChamber MV inhaler Use as instructed   albuterol 108 (90 Base) MCG/ACT inhaler Commonly known as: VENTOLIN HFA Inhale 2 puffs into the lungs every 6 (six) hours as needed for wheezing or shortness of breath. What changed: how much to take   albuterol (2.5 MG/3ML) 0.083% nebulizer solution Commonly known as: PROVENTIL Take 3 mLs (2.5 mg total) by nebulization every 6 (six) hours as needed for wheezing or shortness of breath. What changed: Another medication with the same name was changed. Make sure you understand how and when to take each.   cefdinir 300 MG capsule Commonly known as: OMNICEF Take 1 capsule (300 mg total) by mouth 2 (two) times daily.   feeding supplement (ENSURE ENLIVE) Liqd Take 237 mLs by mouth 2 (two) times daily between meals.   Flutter Devi Use 4 times daily   folic acid 1 MG tablet Commonly known as: FOLVITE Take 1 tablet (1 mg total) by mouth daily.   losartan 100 MG tablet Commonly known as: COZAAR Take 1 tablet  (100 mg total) by mouth daily.   mometasone-formoterol 200-5 MCG/ACT Aero Commonly known as: DULERA Inhale 2 puffs into the lungs 2 (two) times daily.   predniSONE 10 MG tablet Commonly known as: DELTASONE Take 2 tablets PO daily till seen by [name] What changed: additional instructions   Spiriva HandiHaler 18 MCG inhalation capsule Generic drug: tiotropium Place 1 capsule (18 mcg total) into inhaler and inhale daily.   thiamine 100 MG tablet Commonly known as: Vitamin B-1 Take 1 tablet (100 mg total) by mouth daily.      Follow-up Information    [name], MD Follow up.   Specialty: Pulmonary Disease Why: keep existing appointment Contact information: [address] [address] [phone]        O'Neal, Cassie Freer, MD. Schedule an appointment as soon as possible for a visit in 2 week(s).   Specialties: Internal Medicine, Cardiology, Radiology Contact information: [place] [address] [phone]          Allergies  Allergen Reactions  . Gabapentin Other (See Comments)  hallucinations    Other Procedures/Studies: DG Chest Port 1 View  Result Date: 04/23/2019 CLINICAL DATA:  59 year old male with cough and shortness of breath. EXAM: PORTABLE CHEST 1 VIEW COMPARISON:  Chest radiographs 11/09/2018 and earlier. FINDINGS: Portable AP upright view at 0428 hours. Recurrent right lower lung airspace opacity appearing similar to portable chest radiographs and chest CT 08/09/2018. Underlying chronic pulmonary hyperinflation and interstitial markings. No pleural effusion is evident. Mediastinal contours remain normal. Numerous chronic bilateral rib fractures. No acute osseous abnormality identified. IMPRESSION: 1. Recurrent right lower lung airspace opacity compatible with pneumonia. No pleural effusion. 2. Underlying chronic lung disease. 3. Followup PA and lateral chest X-ray is recommended in 3-4 weeks following trial of  antibiotic therapy to ensure resolution and exclude underlying malignancy. Electronically Signed   By: Genevie Ann M.D.   On: 04/23/2019 04:49     TODAY-DAY OF DISCHARGE:  Subjective:   James [name] today has no headache,no chest abdominal pain,no new weakness tingling or numbness, feels much better wants to go home today.   Objective:   Blood pressure (!) 136/93, pulse 69, temperature 98.1 F (36.7 C), temperature source Oral, resp. rate 16, height 5\' 9"  (1.753 m), weight 82.2 kg, SpO2 92 %.  Intake/Output Summary (Last 24 hours) at 04/27/2019 1123 Last data filed at 04/27/2019 2841 Gross per 24 hour  Intake 363 ml  Output 2900 ml  Net -2537 ml   Filed Weights   04/24/19 0314 04/27/19 0318  Weight: 83.2 kg 82.2 kg    Exam: Awake Alert, Oriented *3, No new F.N deficits, Normal affect [place].AT,PERRAL Supple Neck,No JVD, No cervical lymphadenopathy appriciated.  Symmetrical Chest wall movement, Good air movement bilaterally, CTAB RRR,No Gallops,Rubs or new Murmurs, No Parasternal Heave +ve B.Sounds, Abd Soft, Non tender, No organomegaly appriciated, No rebound -guarding or rigidity. No Cyanosis, Clubbing or edema, No new Rash or bruise   PERTINENT RADIOLOGIC STUDIES: No results found.   PERTINENT LAB RESULTS: CBC: Recent Labs    04/25/19 0412 04/26/19 0645  WBC 15.8* 16.6*  HGB 14.6 14.2  HCT 45.2 43.5  PLT 276 251   CMET CMP     Component Value Date/Time   NA 140 04/26/2019 0645   NA 142 06/20/2018 1159   K 4.3 04/26/2019 0645   CL 104 04/26/2019 0645   CO2 27 04/26/2019 0645   GLUCOSE 158 (H) 04/26/2019 0645   BUN 19 04/26/2019 0645   BUN 19 06/20/2018 1159   CREATININE 0.71 04/26/2019 0645   CREATININE 0.79 10/26/2016 1103   CALCIUM 9.2 04/26/2019 0645   PROT 5.9 (L) 04/23/2019 1037   PROT 6.0 06/20/2018 1159   ALBUMIN 3.4 (L) 04/23/2019 1037   ALBUMIN 4.1 06/20/2018 1159   AST 28 04/23/2019 1037   ALT 26 04/23/2019 1037   ALT 62 (H) 06/23/2016 1226    ALKPHOS 34 (L) 04/23/2019 1037   BILITOT 0.5 04/23/2019 1037   BILITOT 0.4 06/20/2018 1159   GFRNONAA >60 04/26/2019 0645   GFRAA >60 04/26/2019 0645    GFR Estimated Creatinine Clearance: 101.9 mL/min (by C-G formula based on SCr of 0.71 mg/dL). No results for input(s): LIPASE, AMYLASE in the last 72 hours. No results for input(s): CKTOTAL, CKMB, CKMBINDEX, TROPONINI in the last 72 hours. Invalid input(s): POCBNP No results for input(s): DDIMER in the last 72 hours. No results for input(s): HGBA1C in the last 72 hours. No results for input(s): CHOL, HDL, LDLCALC, TRIG, CHOLHDL, LDLDIRECT in the last 72 hours. No results for  input(s): TSH, T4TOTAL, T3FREE, THYROIDAB in the last 72 hours.  Invalid input(s): FREET3 No results for input(s): VITAMINB12, FOLATE, FERRITIN, TIBC, IRON, RETICCTPCT in the last 72 hours. Coags: No results for input(s): INR in the last 72 hours.  Invalid input(s): PT Microbiology: Recent Results (from the past 240 hour(s))  Respiratory Panel by RT PCR (Flu A&B, Covid) - Nasopharyngeal Swab     Status: None   Collection Time: 04/23/19  4:19 AM   Specimen: Nasopharyngeal Swab  Result Value Ref Range Status   SARS Coronavirus 2 by RT PCR NEGATIVE NEGATIVE Final    Comment: (NOTE) SARS-CoV-2 target nucleic acids are NOT DETECTED. The SARS-CoV-2 RNA is generally detectable in upper respiratoy specimens during the acute phase of infection. The lowest concentration of SARS-CoV-2 viral copies this assay can detect is 131 copies/mL. A negative result does not preclude SARS-Cov-2 infection and should not be used as the sole basis for treatment or other patient management decisions. A negative result may occur with  improper specimen collection/handling, submission of specimen other than nasopharyngeal swab, presence of viral mutation(s) within the areas targeted by this assay, and inadequate number of viral copies (<131 copies/mL). A negative result must be  combined with clinical observations, patient history, and epidemiological information. The expected result is Negative. Fact Sheet for Patients:  PinkCheek.be Fact Sheet for Healthcare Providers:  GravelBags.it This test is not yet ap proved or cleared by the [place] FDA and  has been authorized for detection and/or diagnosis of SARS-CoV-2 by FDA under an Emergency Use Authorization (EUA). This EUA will remain  in effect (meaning this test can be used) for the duration of the COVID-19 declaration under Section 564(b)(1) of the Act, 21 U.S.C. section 360bbb-3(b)(1), unless the authorization is terminated or revoked sooner.    Influenza A by PCR NEGATIVE NEGATIVE Final   Influenza B by PCR NEGATIVE NEGATIVE Final    Comment: (NOTE) The Xpert Xpress SARS-CoV-2/FLU/RSV assay is intended as an aid in  the diagnosis of influenza from Nasopharyngeal swab specimens and  should not be used as a sole basis for treatment. Nasal washings and  aspirates are unacceptable for Xpert Xpress SARS-CoV-2/FLU/RSV  testing. Fact Sheet for Patients: PinkCheek.be Fact Sheet for Healthcare Providers: GravelBags.it This test is not yet approved or cleared by the [place] FDA and  has been authorized for detection and/or diagnosis of SARS-CoV-2 by  FDA under an Emergency Use Authorization (EUA). This EUA will remain  in effect (meaning this test can be used) for the duration of the  Covid-19 declaration under Section 564(b)(1) of the Act, 21  U.S.C. section 360bbb-3(b)(1), unless the authorization is  terminated or revoked. Performed at [facility], [place] [address], [place], [place] [address]   Blood Culture (routine x 2)     Status: None (Preliminary result)   Collection Time: 04/23/19  5:17 AM   Specimen: BLOOD  Result Value Ref Range Status   Specimen Description BLOOD  LEFT ANTECUBITAL  Final   Special Requests   Final    BOTTLES DRAWN AEROBIC AND ANAEROBIC Blood Culture adequate volume   Culture   Final    NO GROWTH 3 DAYS Performed at [facility], [place] [address], [place], [place] [address]    Report Status PENDING  Incomplete  Blood Culture (routine x 2)     Status: None (Preliminary result)   Collection Time: 04/23/19  5:30 AM   Specimen: BLOOD  Result Value Ref Range Status  Specimen Description BLOOD RIGHT ANTECUBITAL  Final   Special Requests   Final    BOTTLES DRAWN AEROBIC AND ANAEROBIC Blood Culture adequate volume   Culture   Final    NO GROWTH 3 DAYS Performed at [facility], [address], [place], [place] [address]    Report Status PENDING  Incomplete  MRSA PCR Screening     Status: None   Collection Time: 04/24/19 10:00 PM   Specimen: Nasal Mucosa; Nasopharyngeal  Result Value Ref Range Status   MRSA by PCR NEGATIVE NEGATIVE Final    Comment:        The GeneXpert MRSA Assay (FDA approved for NASAL specimens only), is one component of a comprehensive MRSA colonization surveillance program. It is not intended to diagnose MRSA infection nor to guide or monitor treatment for MRSA infections. Performed at [facility], [place] [address] [place], [place] [address]     FURTHER DISCHARGE INSTRUCTIONS:  Get Medicines reviewed and adjusted: Please take all your medications with you for your next visit with your Primary MD  Laboratory/radiological data: Please request your Primary MD to go over all hospital tests and procedure/radiological results at the follow up, please ask your Primary MD to get all Hospital records sent to his/her office.  In some cases, they will be blood work, cultures and biopsy results pending at the time of your discharge. Please request that your primary care M.D. goes through all the records of your hospital data and follows up on these results.  Also Note the following: If you  experience worsening of your admission symptoms, develop shortness of breath, life threatening emergency, suicidal or homicidal thoughts you must seek medical attention immediately by calling 911 or calling your MD immediately  if symptoms less severe.  You must read complete instructions/literature along with all the possible adverse reactions/side effects for all the Medicines you take and that have been prescribed to you. Take any new Medicines after you have completely understood and accpet all the possible adverse reactions/side effects.   Do not drive when taking Pain medications or sleeping medications (Benzodaizepines)  Do not take more than prescribed Pain, Sleep and Anxiety Medications. It is not advisable to combine anxiety,sleep and pain medications without talking with your primary care practitioner  Special Instructions: If you have smoked or chewed Tobacco  in the last 2 yrs please stop smoking, stop any regular Alcohol  and or any Recreational drug use.  Wear Seat belts while driving.  Please note: You were cared for by a hospitalist during your hospital stay. Once you are discharged, your primary care physician will handle any further medical issues. Please note that NO REFILLS for any discharge medications will be authorized once you are discharged, as it is imperative that you return to your primary care physician (or establish a relationship with a primary care physician if you do not have one) for your post hospital discharge needs so that they can reassess your need for medications and monitor your lab values.  Total Time spent coordinating discharge including counseling, education and face to face time equals 35 minutes.  SignedOren Binet 04/27/2019 11:23 AM

## 2019-04-27 NOTE — TOC Transition Note (Signed)
Transition of Care [facility]) - CM/SW Discharge Note   Patient Details  Name: James [name] MRN: [id number] Date of Birth: January 23, 1960  [facility] of [facility] CM/SW Contact:  Benard Halsted, LCSW Phone Number: 04/27/2019, 11:58 AM   Clinical Narrative:    Patient ready for discharge today. Per patient's sister, Reita Cliche will need to provide transportation home for patient as family is unavailable. Patient/sister declined home health and DME. MSW Intern contacted APS to see if any resources were available to check on patient once he discharges home, but they stated there were no services available. Patient has a community paramedic come out and check on him per sister. No further needs identified at this time.    Final next level of care: Home/Self Care Barriers to Discharge: Barriers Resolved   Patient Goals and CMS Choice Patient states their goals for this hospitalization and ongoing recovery are:: Return home CMS Medicare.gov Compare Post Acute Care list provided to:: Patient(and HCPOA [name]) Choice offered to / list presented to : Sibling, Patient  Discharge Placement                  Name of family member notified: Sister, [name] Patient and family notified of of transfer: 04/27/19  Discharge Plan and Services In-house Referral: Clinical Social Work   Post Acute Care Choice: Home Health                    HH Arranged: Patient Refused [facility]          Social Determinants of Health ([place]) Interventions     Readmission Risk Interventions Readmission Risk Prevention Plan 04/26/2019  Transportation Screening Complete  PCP or Specialist Appt within 3-5 Days Complete  HRI or [place] Complete  Social Work Consult for [place] Planning/Counseling Complete  Palliative Care Screening Not Applicable  Medication Review Press photographer) Complete  Some recent data might be hidden

## 2019-04-27 NOTE — Progress Notes (Signed)
Physical Therapy Treatment Patient Details Name: James [name] MRN: [id number] DOB: 28-Aug-1960 Today's Date: 04/27/2019    History of Present Illness 59 year old male admitted 04/23/19 with SOB. Patient with multiple admissions in May, July, August, September for acute on chronic respiratory failure associated wtih COPD exacerbation and recurrent PNA. It is thought that his living conditions (non air conditioned log cabin) may be a contributing factor. He was recently weakned on his steroids and reports increasing respiratory difficulty when this happens. Patient is at high risk for withdrawal incuding seizures and DTs due to his chronic Etoh dependence. CIWA protocol. CXR: RLL PNA. Placed on Bipap. PMH: HTN, COPD not on home O2, Etoh dependence, intellectual disability (cannot read or write), PNA     PT Comments    Patient with improved mobility but still at risk for falls and balance deficits most noticeable with distraction and head turns. Improved gait quality with use of RW. Patient demonstrates decreased insight into impairments. Question if he would use RW at discharge. Continued recommendation for home PT and 24/7 supervision/assist. If 24/7 supervision/assist unable to be provided, recommend SNF until patient is at baseline.    Follow Up Recommendations  Home health PT;Supervision/Assistance - 24 hour;SNF     Equipment Recommendations  Rolling walker with 5" wheels       Precautions / Restrictions Precautions Precautions: Fall Precaution Comments: HOB>30 degrees, O2 sat>92% Restrictions Weight Bearing Restrictions: No    Mobility  Bed Mobility   Transfers Overall transfer level: Independent     Sit to Stand: Independent         General transfer comment: toilet transfer independently  Ambulation/Gait Ambulation/Gait assistance: Min guard;Supervision Gait Distance (Feet): 100 Feet(150 with RW) Assistive device: None;Rolling walker (2 wheeled) Gait  Pattern/deviations: Step-through pattern;Staggering right;Staggering left;Drifts right/left;Scissoring Gait velocity: varies   General Gait Details: Gait trial 1 without AD with contact guard for near LOB especially with distraction/head turns. Gait trial 2 with RW and improved gait quality. Cues and education on proper use and standing closer to RW for improved stability and upright posture.      Balance Overall balance assessment: Needs assistance         Standing balance support: No upper extremity supported Standing balance-Leahy Scale: (Good-) Standing balance comment: balance impairements most noticable with distraction and head turns     Cognition Arousal/Alertness: Awake/alert Behavior During Therapy: WFL for tasks assessed/performed        General Comments General comments (skin integrity, edema, etc.): Education on benefits of use of RW.      Pertinent Vitals/Pain Pain Assessment: No/denies pain(No complaints of pain, no signs/symptoms of pain)           PT Goals (current goals can now be found in the care plan section) Progress towards PT goals: Progressing toward goals    Frequency    Min 3X/week      PT Plan Current plan remains appropriate       AM-PAC PT "6 Clicks" Mobility   Outcome Measure  Help needed turning from your back to your side while in a flat bed without using bedrails?: None Help needed moving from lying on your back to sitting on the side of a flat bed without using bedrails?: None Help needed moving to and from a bed to a chair (including a wheelchair)?: None Help needed standing up from a chair using your arms (e.g., wheelchair or bedside chair)?: None Help needed to walk in hospital room?: A Little Help  needed climbing 3-5 steps with a railing? : A Little 6 Click Score: 22    End of Session   Activity Tolerance: Patient tolerated treatment well Patient left: in chair;with call bell/phone within reach   PT Visit Diagnosis:  Unsteadiness on feet (R26.81);Other abnormalities of gait and mobility (R26.89)     Time: 1364-3837 PT Time Calculation (min) (ACUTE ONLY): 19 min  Charges:  $Gait Training: 8-22 mins                     Birdie Hopes, PT, DPT Acute Rehab [phone] office     Birdie Hopes 04/27/2019, 2:40 PM

## 2019-04-28 LAB — CULTURE, BLOOD (ROUTINE X 2)
Culture: NO GROWTH
Culture: NO GROWTH
Special Requests: ADEQUATE
Special Requests: ADEQUATE

## 2019-04-30 ENCOUNTER — Telehealth: Payer: Self-pay

## 2019-04-30 NOTE — Telephone Encounter (Signed)
Transition Care Management Follow-up Telephone Call Date of discharge and from where: 04/27/2019, [facility]  Call placed to patient's sister, [name] # [phone]   message left with call back requested to this CM.    Patient has appointment with [name] tomorrow - 05/01/2019.

## 2019-05-01 ENCOUNTER — Telehealth: Payer: Self-pay

## 2019-05-01 ENCOUNTER — Ambulatory Visit: Payer: Medicaid Other | Attending: Critical Care Medicine | Admitting: Critical Care Medicine

## 2019-05-01 ENCOUNTER — Encounter: Payer: Self-pay | Admitting: Critical Care Medicine

## 2019-05-01 ENCOUNTER — Other Ambulatory Visit: Payer: Self-pay

## 2019-05-01 VITALS — BP 113/72 | HR 107 | Temp 98.6°F | Ht 69.0 in | Wt 182.2 lb

## 2019-05-01 DIAGNOSIS — L2089 Other atopic dermatitis: Secondary | ICD-10-CM | POA: Insufficient documentation

## 2019-05-01 DIAGNOSIS — F1020 Alcohol dependence, uncomplicated: Secondary | ICD-10-CM | POA: Insufficient documentation

## 2019-05-01 DIAGNOSIS — Z7951 Long term (current) use of inhaled steroids: Secondary | ICD-10-CM | POA: Insufficient documentation

## 2019-05-01 DIAGNOSIS — Z825 Family history of asthma and other chronic lower respiratory diseases: Secondary | ICD-10-CM | POA: Diagnosis not present

## 2019-05-01 DIAGNOSIS — Z87891 Personal history of nicotine dependence: Secondary | ICD-10-CM | POA: Insufficient documentation

## 2019-05-01 DIAGNOSIS — Z7952 Long term (current) use of systemic steroids: Secondary | ICD-10-CM | POA: Insufficient documentation

## 2019-05-01 DIAGNOSIS — J189 Pneumonia, unspecified organism: Secondary | ICD-10-CM | POA: Diagnosis not present

## 2019-05-01 DIAGNOSIS — J449 Chronic obstructive pulmonary disease, unspecified: Secondary | ICD-10-CM | POA: Diagnosis not present

## 2019-05-01 DIAGNOSIS — Z79899 Other long term (current) drug therapy: Secondary | ICD-10-CM | POA: Insufficient documentation

## 2019-05-01 DIAGNOSIS — L209 Atopic dermatitis, unspecified: Secondary | ICD-10-CM | POA: Insufficient documentation

## 2019-05-01 DIAGNOSIS — I10 Essential (primary) hypertension: Secondary | ICD-10-CM | POA: Diagnosis not present

## 2019-05-01 DIAGNOSIS — Z8249 Family history of ischemic heart disease and other diseases of the circulatory system: Secondary | ICD-10-CM | POA: Insufficient documentation

## 2019-05-01 DIAGNOSIS — J441 Chronic obstructive pulmonary disease with (acute) exacerbation: Secondary | ICD-10-CM

## 2019-05-01 MED ORDER — HYDROXYZINE HCL 25 MG PO TABS: 25.0000 mg | ORAL_TABLET | Freq: Three times a day (TID) | ORAL | 0 refills | Status: DC | PRN

## 2019-05-01 MED ORDER — TRIAMCINOLONE ACETONIDE 0.1 % EX CREA
1.0000 | TOPICAL_CREAM | Freq: Two times a day (BID) | CUTANEOUS | 0 refills | Status: DC
Start: 2019-05-01 — End: 2019-05-09

## 2019-05-01 MED FILL — TRIAMCINOLONE ACETONIDE 0.1: 0.1 | 15 days supply | Qty: 30 | Fill #0

## 2019-05-01 MED FILL — hydrOXYzine HCL 25 MG TABS: 25 | 10 days supply | Qty: 30 | Fill #0

## 2019-05-01 NOTE — Progress Notes (Signed)
COPD and rash on hands, arms and face

## 2019-05-01 NOTE — Telephone Encounter (Signed)
Transition Care Management Follow-up Telephone Call Attempt # 2 Date of discharge and from where: 04/27/2019, [facility]  Call placed to patient's sister, [name] # [phone]   message left with call back requested to this CM.    Patient has appointment with [name] this afternoon

## 2019-05-01 NOTE — Patient Instructions (Signed)
No change in inhalers  Trial kenalog cream apply to arms twice a day  Atarax 25 mg three times a day as needed  Return 2 months

## 2019-05-01 NOTE — Progress Notes (Signed)
olu  Subjective:    Patient ID: James [name], male    DOB: 22-Nov-1960, 59 y.o.   MRN: [id number]  58 y.o.M with advanced Copd   I saw this patient a week ago and prescribed antibiotics and prednisone for what I perceived to be persisting pneumonia.  The patient had a slight improvement but then worsened and came to the emergency room the end of the week and was admitted for 3 days for COPD exacerbation and lower lobe pneumonia.  Chest x-ray did show persistent infiltrates in the lower lobes.  The patient is now discharged and returns to the office for post hospital follow-up.  Below is the discharge summary Dc summary : Admit date: 07/31/2018 Discharge date: 08/01/2018  Time spent: 45 minutes  Recommendations for Outpatient Follow-up:  Patient will be discharged to home.  Patient will need to follow up with primary care provider within one week of discharge.  Patient should continue medications as prescribed.  Patient should follow a heart healthy diet.   Discharge Diagnoses:  Acute on chronic respiratory failure with hypoxia secondary to COPD Exacerbation and pneumonia Essential hypertension  Discharge Condition: Stable  Diet recommendation: heart healthy  Filed Weights  07/31/18 0231 07/31/18 0602 Weight: 85.3 kg 83.4 kg   History of present illness:  on 07/31/2018 by James [name] [name] a 59 y.o.malewith medical history significant ofCOPD. Patient has had 3 prior admits for COPD / PNA thus far this year, most recently in May.  Patient has been being treated for COPD exacerbation and BLL CAP as outpatient by James [name] with levaquin and prednisone. Just finished levaquin yesterday. Prednisone remained at 40mg  daily with plans to initiate a slow taper.  Despite this, patient developed severe SOB and respiratory distress this evening. EMS called and patient noted to be satting 80% on RA. He is not on chronic O2 for his COPD.  Hospital Course:    Acute on chronic respiratory failure with hypoxia secondary to COPD Exacerbation and pneumonia -Patient noted to have oxygen saturations in the 80s on room air -Chest x-ray noted for pneumonia -Was recently placed on Levaquin and steroids by PCP approximately 1 week ago -On admission, patient continued to have wheezing well into his hospital stay day 1.  Patient currently still has some wheezing however has improved. -Was placed on Solu-Medrol, along with nebulizer treatments, azithromycin and ceftriaxone -Patient did require oxygen upon admission however has been able to ambulate and maintain oxygen saturations in the high 90s on room air. -Will discharge patient with prednisone taper, azithromycin and Ceftin. -Given that patient had oxygen saturations in the 80s on admission, it was thought that he would require several days in the hospital. Patient actually improved quicker than expected.  Essential hypertension -Continue losartan  Since discharge the patient is tried to go back to work but has extreme difficulty in the heat with this.  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  10/05/2018 This patient's not been seen since the end of July he had failed several follow-up visits.  He did see James [name] end of August who did not make major changes in  his medication program.  He is to remain on James [name] and Incruse.  The patient after being admitted in July got readmitted early August and then again in early September.  Discharge summaries are as noted below.  During the August admission the patient underwent bronchoscopy and had mucous plugging removed the culture showed normal  flora Note this patient has not been actively smoking recently Admit date: 08/09/2018 Discharge date: 08/11/2018  Discharge Diagnoses:  Acute on chronic hypoxic respiratory failure/sepsis due to multifocal pneumonia COPD Alcohol/marijuana use Essential hypertension  History of present illness:  on 08/09/2018 by Dr. Joellen [name] [name] a 59 y.o.malewith medical history significant ofCOPD and ETOH dependence presenting with respiratory distress.He woke up about 530 this AM and couldn't breathe. This just keeps happening. He felt similarly when hospitalized last week. He has been doing fine until today. No h/o OSA, not on CPAP. Not on home O2. +cough, productive of yellowish sputum. It is sometimes hard to get the sputum up. No fever. +wheezing since his last hospitalization. Quit smoking in 2014. He lives in a non-air-conditioned log cabin.  He was hospitalized from 7/27-28 for acute on chronic respiratory failure associated with COPD exacerbation and PNA. This was his 4th admission this year. He was seen by James [name] on 7/30 with plan for completion of course of Keflex and prednisone, as well as prednisone taper.  Hospital Course:  Acute on chronic hypoxic respiratory failure/sepsis due to multifocal pneumonia -Was recently hospitalized in late July for pneumonia and COPD exacerbation. Was discharged with antibiotics as well as prednisone taper. Patient followed up with James [name] on 08/03/2018 with completion of Keflex and prednisone as well. -This is his fourth admission this year for similar presentation -Patient presented with cough and shortness of breath -Chest x-rayon admissionfindings consistent with right-sided multifocal pneumonia -CTA chest showed multifocal pneumonia on the right. COPD including emphysema. Negative for PE. -COVID negative -Upon admission, patient had leukocytosis, fever, tachycardia, tachypnea- all resolving  -Noted to be  hypoxic 88% on room air in the ED -Urine strep pneumoniaand legionellaantigensnegative -Sputum culture: Few GPC, GVR, rare yeast -Blood cultures show no growth -was placed on vancomycin, Zosyn, Solu-Medrol, supplemental oxygen -S/p bronchoscopy 8/6, pending culture results -CXR obtained after bronch on 8/6: almost complete clearing of the hazy infiltrates in the right lung -Discussed with Dr. Tamala Julian (pulm), recommended prednisone taper starting with prednisone 40mg  daily x 1 week, taper as an outpatient, along with Augmentin. He will follow up with the patient in one week. Given that infection cleared on xray so quickly, question if this is inflammatory vs infection.  -patient able to ambulate today without oxygen and maintained oxygen saturations in the mid 90s- therefore patient does not qualify for home oxygen -patient also had a swallow eval- mild aspiration risk  COPD -Continue nebulizer treatments, steroids, inhalers -Incentive spirometry, flutter valve  Alcohol/marijuana use -Discussed cessation -was placed on continue CIWA protocol  Essential hypertension -Continue Cozaar  Consultants PCCM- pulmonology   Procedures Bronchoscopy   Recommendations for Outpatient Follow-up:  1. Follow up with PCP in 1-2 weeks 2. Please obtain BMP/CBC in one week your next doctors visit.  3. Augmentin and azithromycin for 5 days 4. Advised to continue using bronchodilators every 4-6 hours for the next 4-5 days and then transition to as needed. 5. Advised to take his home prednisone, for next 3 days advised to increase it to 40 mg twice daily then resume back is 20 mg daily  Discharge Condition: Stable  CODE STATUS: Full code Diet recommendation: 2 g salt   DC summary from 09/11/18  Adm 9/7  D/C 09/13/18 Brief/Interim Summary: James [name] a 59 y.o.malewith history ofCOPD on prednisone, HTN, alcohol use disorder, marijuana use,tobacco user. Patient presented secondary to  dyspnea and found to have evidence of pneumonia and a COPD exacerbation in addition to sepsis physiology on admission.  There is concerns of possible right lower lobe pneumonia therefore started on Unasyn and azithromycin due to concerns of aspiration.  This was transitioned to Augmentin and azithromycin for total of 5 days at time of discharge.  Secondary to infection, he was having COPD exacerbation therefore on bronchodilators.  He started improving and on the day of discharge he was adamant about going home as he was feeling much better.  He was still having abnormal breath sounds and cough therefore I had requested him to stay another day in the hospital but still wanted to go home.  Instructions were given to him take 40 mg of prednisone for next 3 days and then he can resume back his 20 mg daily.  Advised... home bronchodilators periodically every 4-6 hours for next 4-5 days and then transition to as needed. Advised to follow-up with his PCP in about 1 week.   Discharge Diagnoses:  Active Problems:   Hypertension   COPD exacerbation ([place])   Marijuana abuse   Pneumonia   Sepsis ([place])  Acute respiratory distress secondary to right lower lobe pneumonia, community-acquired/aspiration Acute mild exacerbation of COPD Concern for possible aspiration.  Unasyn and azithromycin transition to Augmentin and azithromycin.  We will give him 5 more day course at the time of discharge 40 mg of prednisone for next 3 days then transition to 20 mg daily which is his home dose Advised to use bronchodilators.3  Alcohol use disorder Counseled to quit drinking alcohol.  Advised to continue thiamine, folate and multivitamin  Weakness -He did well with physical therapy.  Tobacco use Marijuana use Cessation discussed on admission  Consultations:  None  Subjective: Still having some cough and congestion but he is adamant about going home today as he is feeling much better than from admission.  I  recommended he stay in the hospital for at least 1 more day to help him feel even better.  He wished to go home. Ambulating in the hallway without additional saturating greater than 90% on room air.   Since discharge this patient went to urgent care on 21 September with an outbreak of shingles over his left lower back.  He is taken a course of valacyclovir and prednisone.  He has severe pain.  He was given gabapentin but this caused hallucinations and then we tried tramadol but this caused severe nausea and vomiting.  Apparently the patient cannot take Tylenol or nonsteroidal anti-inflammatories as well.  The patient states his breathing is been labored.  He still has productive cough of white mucus.  He has no fever.  He has significant back pain because of the shingles. The patient is maintaining the [facility] and Incruse as well as his antihypertensive medications and vitamins     10/12/2018  This patient is seen in return follow-up for acute shingles with postherpetic neuropathy, recent pneumonia, COPD exacerbation, and hypertension.  Patient is markedly improved from the last visit.  He has less cough less shortness of breath.  He has improved pain around the area of the shingles.  He is taking amitriptyline 25 mg at bedtime  The patient notes less  pain.  He does state he had been using the tramadol without nausea or vomiting.  I asked if patient would be interested in the para medicine program and expressed interest in the community para medicine program  11/23/2018 Patient seen in return follow-up and has noted increasing shortness of breath and we had to resume prednisone this patient.  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  He does maintain his inhalers.  He is drinking 4 beers a night.  He is not smoking.  Note the patient continues to live in a home that has standing water under the foundation and in several the rooms and mold on the carpet.  We have connected this patient with legal  aid to see if they can assist him in getting into better housing.  The patient's not had previous allergy testing   12/13/2018 The patient notes increased shortness of breath, wheezing and cough productive of thick yellow mucus.  He is using the flutter valve but is not being is productive with this.  He denies chest pain or fever.  He is down to 2 a day on the prednisone.  He is maintaining his inhalers. Apparently there is repair is being done on the patient's home and he is decided to stay in the home  02/27/2019 This patient is seen in return follow-up for COPD.  The patient has significant mold exposure in the home.  There has been some mitigation of this but is not complete.  The patient just was in the urgent care for COPD exacerbation a week ago treated with a course of antibiotics and prednisone.  He is now off prednisone. Patient states the pain in the back has resolved however he is still having increased shortness of breath.  He still having productive cough of thick yellow mucus.  05/01/2019  Since the last OV the patient has been admitted for Copd flare again. Below is the discharge summary Dc summary:  Admit Date: 04/23/2019 Discharge date: 04/27/2019  Recommendations for Outpatient Follow-up:  1. Follow up with PCP in 1-2 weeks 2. Please obtain CMP/CBC in one week 3. Please repeat chest x-ray in the next 6 to 8 weeks to document resolution of infiltrates, if not may require further work-up.     Brief Narrative: Patient is a54 y.o.malewith history of COPD, intellectual disability, HTN who presented with shortness of breath-found to have acute hypoxic respiratory failure secondary to pneumonia and COPD exacerbation. Patient initially required BiPAP on admission. See below for further details.  Significant events: 4/19>> admit to [facility] for severe hypoxemia requiring BiPAP 4/20>> liberated from BiPAP  Antimicrobial therapy: Rocephin 4/18>>4/23 Zithromax  4/18>>4/22  Microbiology data: Blood culture: 4/19>> negative  Procedures : None  Consults: None  Brief Hospital Course: Acute hypoxic/hypercarbic respiratory failure secondary to COPD exacerbation and pneumonia:Improved-initially required BiPAP on admission-titrated off BiPAP on 4/20.Now on room air with stable O2 saturations.  Feels much better-treated with Rocephin and Zithromax-we will transition to [facility] for a few more days on discharge.  He will resume his usual inhaler regimen-he will be placed on 20 mg of p.o. prednisone until he is seen by James [name].  PCP to repeat a chest x-ray in 6 to 8 weeks to document resolution of infiltrates-if not he may require further work-up to rule out underlying malignancy.  (Per patient's sister-patient probably has been taking more prednisone than he should be-he is unable to read-and is not able to follow directions)  HTN: BPstable-continue losartan  EtOH abuse:No signs of withdrawal-managed with  Ativan per protocol. Per patient's sister-he drinks around 2-3 bottles of beer on a daily basis.  Intellectual disability:Spoke at length with patient's sister ([name]) today-patient isa illiterate-and has been living on his own for all his life. His other sister lives a [place] and checks on him daily. Sister acknowledges poor living conditions-but apparently this is the way that patient has lived all his life-and chooses to live this way. 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 Patient is currently managing all his finances. [name] does acknowledge that patient will not let any home health services come to his house-he will not go to SNF. Difficult situation-but even though patient is an illiterate/intellectual disability-he understand that he is somewhat deconditioned from his acute illness-and is in need of some help-he does not want therapy services-he understands the risk of falls from being weak.  This MD had numerous  discussions with him regarding his appropriate disposition-all he wants to do is go home-his sister-[name] wants to have him come to her place in [place], [place] for a few weeks-however the patient really does not want to go there-and wants to go home.  I have discussed at length with social worker as well-at this point he is being discharged home at his own request.  His family will keep an eye on him.   Nutrition Problem: Nutrition Problem: Increased nutrient needs Etiology: chronic illness(COPD) Signs/Symptoms: estimated needs Interventions: Ensure Enlive (each supplement provides 350kcal and 20 grams of protein)  Discharge Diagnoses:  Principal Problem:   Acute on chronic respiratory failure with hypoxia and hypercapnia (HCC) Active Problems:   Hypertension   COPD exacerbation ([place])   Community acquired pneumonia of right lower lobe of lung   Marijuana abuse   Intellectual disability  Since discharge the patient has completed his antibiotics for his right lower lobe pneumonia.  He is on tapering prednisone.  He has developed a rash on his arms and face.  The rash is pruritic in nature.  He has been maintaining his medications as prescribed.     Shortness of Breath This is a chronic problem. The current episode started more than 1 year ago. The problem occurs daily (qhs shortness of breath). The problem has been gradually improving. Associated symptoms include PND, sputum production and wheezing. Pertinent negatives include no chest pain, claudication, fever, headaches, hemoptysis, leg pain, leg swelling, orthopnea, rash, sore throat, syncope or vomiting. The symptoms are aggravated by any activity and weather changes. Associated symptoms comments: Pain radiates to arm Legs go to sleep No dysphagia No gerd . Risk factors include smoking. He has tried beta agonist inhalers, oral steroids and steroid inhalers for the symptoms. The treatment provided moderate relief. His past  medical history is significant for COPD and pneumonia. There is no history of asthma, CAD, DVT, a heart failure or PE.    Past Medical History:  Diagnosis Date  . Acute on chronic respiratory failure with hypoxia ([place]) 06/08/2013  . CAP (community acquired pneumonia) 05/17/2018   See admit 05/17/18 ? rml  with   covid pcr neg - rx augmentin > f/u cxr in 4-6 weeks is fine unless condition declines   . COPD (chronic obstructive pulmonary disease) ([place])    not on home  . Dyspnea   . Hypertension   . Pneumonia 04/06/2016  . Pneumothorax    2016, fell from ladder  . 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 Tick bite 06/03/2018     Family History  Problem Relation Age of Onset  .  Heart disease Father   . COPD Sister      Social History   Socioeconomic History  . Marital status: Divorced    Spouse name: Not on file  . Number of children: Not on file  . Years of education: Not on file  . Highest education level: Not on file  Occupational History  . Occupation: Dealer  . Occupation: Painter  Tobacco Use  . Smoking status: Former Smoker    Packs/day: 1.50    Years: 40.00    Pack years: 60.00    Types: Cigarettes    Quit date: 2014    Years since quitting: 7.3  . Smokeless tobacco: Current User    Types: Chew  Substance and Sexual Activity  . Alcohol use: Yes    Alcohol/week: 28.0 standard drinks    Types: 28 Cans of beer per week    Comment: 4 beers a night; + jitters with not drinking, denies DTs or seizures  . Drug use: Yes    Types: Marijuana    Comment: daily; quit using crack and methamphetamine about 5 months ago   . Sexual activity: Not Currently    Partners: Female  Other Topics Concern  . Not on file  Social History Narrative   Lives alone near [place]   Social Determinants of Health   Financial Resource Strain: High Risk  . Difficulty of Paying Living Expenses: Hard  Food Insecurity: Unknown  . Worried About Charity fundraiser in the Last Year: Not on  file  . Ran Out of Food in the Last Year: Never true  Transportation Needs: No Transportation Needs  . Lack of Transportation (Medical): No  . Lack of Transportation (Non-Medical): No  Physical Activity: Inactive  . Days of Exercise per Week: 0 days  . Minutes of Exercise per Session: 0 min  Stress: Stress Concern Present  . Feeling of Stress : To some extent  Social Connections: Slightly Isolated  . Frequency of Communication with Friends and Family: Three times a week  . Frequency of Social Gatherings with Friends and Family: Once a week  . Attends Religious Services: 1 to 4 times per year  . Active Member of Clubs or Organizations: Yes  . Attends Archivist Meetings: 1 to 4 times per year  . Marital Status: Divorced  Human resources officer Violence:   . Fear of Current or Ex-Partner:   . Emotionally Abused:   [name] Physically Abused:   . Sexually Abused:      Allergies  Allergen Reactions  . Gabapentin Other (See Comments)    hallucinations     Outpatient Medications Prior to Visit  Medication Sig Dispense Refill  . albuterol (PROVENTIL) (2.5 MG/3ML) 0.083% nebulizer solution Take 3 mLs (2.5 mg total) by nebulization every 6 (six) hours as needed for wheezing or shortness of breath. 150 mL 0  . albuterol (VENTOLIN HFA) 108 (90 Base) MCG/ACT inhaler Inhale 2 puffs into the lungs every 6 (six) hours as needed for wheezing or shortness of breath. 6.7 g 0  . feeding supplement, ENSURE ENLIVE, (ENSURE ENLIVE) LIQD Take 237 mLs by mouth 2 (two) times daily between meals. 16109 mL 0  . folic acid (FOLVITE) 1 MG tablet Take 1 tablet (1 mg total) by mouth daily. 90 tablet 0  . losartan (COZAAR) 100 MG tablet Take 1 tablet (100 mg total) by mouth daily. 30 tablet 0  . mometasone-formoterol (DULERA) 200-5 MCG/ACT AERO Inhale 2 puffs into the lungs 2 (two) times daily.  13 g 0  . predniSONE (DELTASONE) 10 MG tablet Take 2 tablets PO daily till seen by Dr James [name] 60 tablet 0  . Respiratory  Therapy Supplies (FLUTTER) DEVI Use 4 times daily 1 each 0  . Spacer/Aero-Holding Chambers (AEROCHAMBER MV) inhaler Use as instructed 1 each 0  . thiamine (VITAMIN B-1) 100 MG tablet Take 1 tablet (100 mg total) by mouth daily. 30 tablet 3  . tiotropium (SPIRIVA HANDIHALER) 18 MCG inhalation capsule Place 1 capsule (18 mcg total) into inhaler and inhale daily. 30 capsule 0  . cefdinir (OMNICEF) 300 MG capsule Take 1 capsule (300 mg total) by mouth 2 (two) times daily. (Patient not taking: Reported on 05/01/2019) 4 capsule 0   No facility-administered medications prior to visit.     Review of Systems  Constitutional: Negative for fever.  HENT: Negative for sinus pressure, sinus pain, sore throat and trouble swallowing.   Eyes: Negative.   Respiratory: Positive for cough, sputum production, chest tightness, shortness of breath and wheezing. Negative for hemoptysis.   Cardiovascular: Positive for PND. Negative for chest pain, palpitations, orthopnea, claudication, leg swelling and syncope.  Gastrointestinal: Negative for nausea and vomiting.  Genitourinary: Negative.   Musculoskeletal: Positive for back pain.  Skin: Negative for rash.  Neurological: Negative for headaches.  Psychiatric/Behavioral: Negative for hallucinations. The patient is not nervous/anxious and is not hyperactive.        Objective:   Physical Exam Vitals:   05/01/19 1347  BP: 113/72  Pulse: (!) 107  Temp: 98.6 F (37 C)  TempSrc: Temporal  SpO2: 96%  Weight: 182 lb 3.2 oz (82.6 kg)  Height: 5\' 9"  (1.753 m)    Gen: , well-nourished, in no distress,   ENT: No lesions,  mouth clear,  oropharynx clear, no postnasal drip  Neck: No JVD, no TMG, no carotid bruits  Lungs: No use of accessory muscles, no dullness to percussion, inspiratory and expiratory wheeze with poor airflow  Cardiovascular: RRR, heart sounds normal, no murmur or gallops, no peripheral edema  Abdomen: soft and NT, no HSM,  BS  normal  Musculoskeletal: No deformities, no cyanosis or clubbing  Neuro: alert, non focal  Skin: Warm, rash over arms and face  Disability eval at [facility]:   Pulmonary Function Test (04/19/2019 1:42 PM EDT) Pulmonary Function Test (04/19/2019 1:42 PM EDT)  Component Value Ref Range Performed At Pathologist Signature  FVC Post 2.51 L [facility]   FEV1 Post 1.55 L [facility]   FEV1/FVC Post 61.87 % [facility]   FEF25-75% Post 0.90 L/s [facility]   PEF Post 3.42 L/s [facility]   MVV Post 42.00 L/min [facility]   FVC Pre 2.92 L [facility]   FEV1 Pre 1.57 L [facility]   FEV1/FVC Pre 54.01 % [facility]   FEF25-75% Pre 0.68 L/s [facility]   PEF Pre 4.14 L/s [facility]   MVV Pre 45.00 L/min [facility] [facility]   FVC_LLN 3.73  [facility] PFT[facility]   FVC_Z-SCORE -2.84  [facility]   FVC_%PRED 60 % % [facility]   FVC_Z-SCORE -2.84  [facility]   FVC_%PRED 52 % % [facility]   FVC %Chng -14 % % [facility]   FEV1_LLN 2.86  [facility]   FEV1_Z-SCORE -  3.[place]   FEV1_%PRED 42 % % [facility]   FEV1_Z-SCORE -3.78  [facility]1 [facility]   FEV1_%PRED 41 % % [facility]   FEV1 %Chng -2 % % [facility]   FEV1/FVC_LLN 66  [facility]   FEV1/FVC %Chng 15 % % [facility]   FEF25-75%_LLN 1.60  [facility]   FEF25-75%_Z-SCORE -3.05  [facility]   FEF25-75%_%PRED 21 % % [facility]   FEF25-75%_Z-SCORE -3.05  [facility]   FEF25-75%_%PRED 28 % % [facility]FTIS1  [facility]   FEF25-75% %Chng 33 % % [facility]   PEF_LLN 7.25  [facility]   PEF_%PRED 43 % 36 % % [facility]   PEF %Chng -17 % % [facility]   MVV_LLN 130  [facility]   MVV_%PRED 35 % 32 % % [facility]   MVV %Chng -7 % % [facility]     Media Information    Document Information  Photos    11/02/2018 14:43  Attached To:  [name]  Source Information  [name], MD  [facility]    2/15 CXR:/ABD NAD   BMP Latest Ref Rng & Units 04/26/2019 04/25/2019 04/24/2019  Glucose 70 - 99 mg/dL 158(H) 152(H) 145(H)  BUN 6 - 20 mg/dL 19 21(H) 13  Creatinine 0.61 - 1.24 mg/dL 0.71 0.77 0.66  BUN/Creat Ratio 9 - 20 - - -  Sodium 135 - 145 mmol/L 140 141 142  Potassium 3.5 - 5.1 mmol/L 4.3 4.4 4.2  Chloride 98 - 111 mmol/L 104 103 106  CO2 22 - 32 mmol/L 27 28 28   Calcium 8.9 - 10.3 mg/dL 9.2 9.5 8.8(L)   Hepatic Function Panel     Component Value Date/Time   PROT 5.9 (L) 04/23/2019 1037   PROT 6.0 06/20/2018 1159   ALBUMIN 3.4 (L) 04/23/2019 1037   ALBUMIN 4.1 06/20/2018 1159   AST 28 04/23/2019 1037   ALT 26 04/23/2019 1037   ALT 62 (H) 06/23/2016 1226   ALKPHOS 34 (L) 04/23/2019 1037   BILITOT 0.5 04/23/2019 1037   BILITOT 0.4 06/20/2018 1159   BILIDIR 0.2 04/23/2019 1037   BILIDIR 0.12 04/16/2016 1601   IBILI 0.3 04/23/2019 1037   CBC Latest Ref Rng & Units 04/26/2019 04/25/2019 04/24/2019  WBC 4.0 - 10.5 K/uL 16.6(H) 15.8(H) 17.4(H)  Hemoglobin 13.0 - 17.0 g/dL 14.2 14.6 13.3  Hematocrit 39.0 - 52.0 % 43.5 45.2 41.1  Platelets 150 - 400 K/uL 251 276 262       Assessment & Plan:  I personally reviewed all images and lab data in the [facility] system as well as any outside material available during this office visit and agree with the  radiology impressions.   Community acquired pneumonia of right lower lobe of  lung Right lower lobe pneumonia with improvement no further antibiotics indicated we will follow-up chest x-ray in 2 months  COPD exacerbation (HCC)  continue inhaled medications as prescribed  COPD exacerbation now improved    Other atopic dermatitis Atopic dermatitis over arms and face we will prescribe topical steroid and hydroxyzine as needed for pruritus   Diagnoses and all orders for this visit:  Community acquired pneumonia of right lower lobe of lung  COPD mixed type ([place])  History  of tobacco abuse  COPD exacerbation ([place])  Other atopic dermatitis  Other orders -     triamcinolone cream (KENALOG) 0.1 %; Apply 1 application topically 2 (two) times daily. -     hydrOXYzine (ATARAX/VISTARIL) 25 MG tablet; Take 1 tablet (25 mg total) by mouth 3 (three) times daily as needed.

## 2019-05-02 ENCOUNTER — Telehealth: Payer: Self-pay

## 2019-05-02 ENCOUNTER — Other Ambulatory Visit (HOSPITAL_COMMUNITY): Payer: Self-pay

## 2019-05-02 ENCOUNTER — Encounter: Payer: Self-pay | Admitting: Critical Care Medicine

## 2019-05-02 NOTE — Progress Notes (Signed)
Paramedicine Encounter    Patient ID: James [name], male    DOB: [birth year], 59 y.o.   MRN: [id number]   Patient Care Team: [name], MD as PCP - General (Pulmonary Disease) O'Neal, Cassie Freer, MD as PCP - Cardiology (Cardiology)  Patient Active Problem List   Diagnosis Date Noted  . Other atopic dermatitis 05/01/2019  . Acute on chronic respiratory failure with hypoxia and hypercapnia ([place]) 04/23/2019  . Intellectual disability 04/23/2019  . 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 Marijuana abuse 08/09/2018  . Community acquired pneumonia of right lower lobe of lung 05/17/2018  . COPD exacerbation ([place]) 02/16/2018  . Hypertension 05/17/2016  . Low back pain radiating to both legs 04/18/2016  . History of tobacco abuse 04/06/2016  . Alcohol dependence syndrome ([place]) 04/06/2016  . COPD mixed type ([place]) 03/26/2016    Current Outpatient Medications:  .  albuterol (PROVENTIL) (2.5 MG/3ML) 0.083% nebulizer solution, Take 3 mLs (2.5 mg total) by nebulization every 6 (six) hours as needed for wheezing or shortness of breath., Disp: 150 mL, Rfl: 0 .  albuterol (VENTOLIN HFA) 108 (90 Base) MCG/ACT inhaler, Inhale 2 puffs into the lungs every 6 (six) hours as needed for wheezing or shortness of breath., Disp: 6.7 g, Rfl: 0 .  folic acid (FOLVITE) 1 MG tablet, Take 1 tablet (1 mg total) by mouth daily., Disp: 90 tablet, Rfl: 0 .  losartan (COZAAR) 100 MG tablet, Take 1 tablet (100 mg total) by mouth daily., Disp: 30 tablet, Rfl: 0 .  mometasone-formoterol (DULERA) 200-5 MCG/ACT AERO, Inhale 2 puffs into the lungs 2 (two) times daily., Disp: 13 g, Rfl: 0 .  predniSONE (DELTASONE) 10 MG tablet, Take 2 tablets PO daily till seen by [name], Disp: 60 tablet, Rfl: 0 .  Respiratory Therapy Supplies (FLUTTER) DEVI, Use 4 times daily, Disp: 1 each, Rfl: 0 .  Spacer/Aero-Holding Chambers (AEROCHAMBER MV) inhaler, Use as instructed, Disp: 1 each, Rfl: 0 .  tiotropium (SPIRIVA HANDIHALER) 18 MCG  inhalation capsule, Place 1 capsule (18 mcg total) into inhaler and inhale daily., Disp: 30 capsule, Rfl: 0 .  triamcinolone cream (KENALOG) 0.1 %, Apply 1 application topically 2 (two) times daily., Disp: 30 g, Rfl: 0 .  feeding supplement, ENSURE ENLIVE, (ENSURE ENLIVE) LIQD, Take 237 mLs by mouth 2 (two) times daily between meals. (Patient not taking: Reported on 05/02/2019), Disp: 14220 mL, Rfl: 0 .  hydrOXYzine (ATARAX/VISTARIL) 25 MG tablet, Take 1 tablet (25 mg total) by mouth 3 (three) times daily as needed. (Patient not taking: Reported on 05/02/2019), Disp: 30 tablet, Rfl: 0 .  thiamine (VITAMIN B-1) 100 MG tablet, Take 1 tablet (100 mg total) by mouth daily. (Patient not taking: Reported on 05/02/2019), Disp: 30 tablet, Rfl: 3 Allergies  Allergen Reactions  . Gabapentin Other (See Comments)    hallucinations      Social History   Socioeconomic History  . Marital status: Divorced    Spouse name: Not on file  . Number of children: Not on file  . Years of education: Not on file  . Highest education level: Not on file  Occupational History  . Occupation: Dealer  . Occupation: Painter  Tobacco Use  . Smoking status: Former Smoker    Packs/day: 1.50    Years: 40.00    Pack years: 60.00    Types: Cigarettes    Quit date: 2014    Years since quitting: 7.3  . Smokeless tobacco: Current User    Types: Chew  Substance and Sexual Activity  . Alcohol use: Yes    Alcohol/week: 28.0 standard drinks    Types: 28 Cans of beer per week    Comment: 4 beers a night; + jitters with not drinking, denies DTs or seizures  . Drug use: Yes    Types: Marijuana    Comment: daily; quit using crack and methamphetamine about 5 months ago   . Sexual activity: Not Currently    Partners: Female  Other Topics Concern  . Not on file  Social History Narrative   Lives alone near [place]   Social Determinants of Health   Financial Resource Strain: High Risk  . Difficulty of Paying Living  Expenses: Hard  Food Insecurity: Unknown  . Worried About Charity fundraiser in the Last Year: Not on file  . Ran Out of Food in the Last Year: Never true  Transportation Needs: No Transportation Needs  . Lack of Transportation (Medical): No  . Lack of Transportation (Non-Medical): No  Physical Activity: Inactive  . Days of Exercise per Week: 0 days  . Minutes of Exercise per Session: 0 min  Stress: Stress Concern Present  . Feeling of Stress : To some extent  Social Connections: Slightly Isolated  . Frequency of Communication with Friends and Family: Three times a week  . Frequency of Social Gatherings with Friends and Family: Once a week  . Attends Religious Services: 1 to 4 times per year  . Active Member of Clubs or Organizations: Yes  . Attends Archivist Meetings: 1 to 4 times per year  . Marital Status: Divorced  Human resources officer Violence:   . Fear of Current or Ex-Partner:   . Emotionally Abused:   [name] Physically Abused:   . Sexually Abused:     Physical Exam      Future Appointments  Date Time Provider [place]  07/03/2019  2:30 PM [name], MD [facility] None    BP 128/72   Pulse (!) 104   Temp 99 F (37.2 C)   Resp 20   SpO2 97%   Pt recently home from hosp, he has seen PCP since he has been out of hosp. He is done with the antibiotics.   He p/u his hydroxyzine but has lost the bottle so he has not been able to take that. Still has rash on arms. Using cream for that.  He states yesterday he didn't feel good but today he feels better.  He states he is not doing a lot of work outside, still doing some but not as much. Probably more than he is saying.  He denies c/p, no dizziness. No sob today. Lungs are clear today.   [name], [facility] Paramedic  05/03/19

## 2019-05-02 NOTE — Assessment & Plan Note (Signed)
Atopic dermatitis over arms and face we will prescribe topical steroid and hydroxyzine as needed for pruritus

## 2019-05-02 NOTE — Assessment & Plan Note (Addendum)
continue inhaled medications as prescribed  COPD exacerbation now improved

## 2019-05-02 NOTE — Assessment & Plan Note (Signed)
Right lower lobe pneumonia with improvement no further antibiotics indicated we will follow-up chest x-ray in 2 months

## 2019-05-02 NOTE — Telephone Encounter (Signed)
Call received from patient's sister, James [name], She was returning the call to this CM and said that he had been in the hospital. I explained that I saw him yesterday when he was in the clinic.  She was  appreciative of all of the support that he receives and said that she is trying to do as much for him as she can while caring for her own medical issues.

## 2019-05-07 ENCOUNTER — Other Ambulatory Visit: Payer: Self-pay | Admitting: Critical Care Medicine

## 2019-05-09 ENCOUNTER — Other Ambulatory Visit: Payer: Self-pay | Admitting: Critical Care Medicine

## 2019-05-17 ENCOUNTER — Telehealth: Payer: Self-pay | Admitting: Critical Care Medicine

## 2019-05-17 NOTE — Telephone Encounter (Signed)
[facility] predniSONE (DELTASONE) 10 MG tablet [[id number]]

## 2019-05-18 ENCOUNTER — Telehealth: Payer: Self-pay | Admitting: Critical Care Medicine

## 2019-05-18 DIAGNOSIS — J449 Chronic obstructive pulmonary disease, unspecified: Secondary | ICD-10-CM

## 2019-05-18 MED ORDER — TRIAMCINOLONE ACETONIDE 0.1 % EX CREA: 1.0000 "application " | TOPICAL_CREAM | Freq: Two times a day (BID) | CUTANEOUS | 0 refills | Status: DC

## 2019-05-18 NOTE — Telephone Encounter (Signed)
I sent refill on cream

## 2019-05-18 NOTE — Telephone Encounter (Signed)
Please call the patient and find out what the medication reaction is,  Ask him to hold the prednisone for now     I will f/u Monday,

## 2019-05-18 NOTE — Telephone Encounter (Signed)
Called in and requested for prednisone to be refilled. Please follow up at your earliest convenience.

## 2019-05-18 NOTE — Addendum Note (Signed)
Addended by: Asencion Noble E on: 05/18/2019 02:44 PM   Modules accepted: Orders

## 2019-05-18 NOTE — Telephone Encounter (Signed)
Spoke with the sister / stated that she does not think the reaction is from the prednisone, that he has been taking prednisone many times to help with his breathing. Stated she spoke with Opal Sidles and ask for a refill on his triamcinolone cream and that was the reason for her call. Pt still experiencing BL arms rash with itchy sensation.Please advice!

## 2019-05-20 MED ORDER — PREDNISONE 10 MG PO TABS: ORAL_TABLET | ORAL | 0 refills | Status: DC

## 2019-05-20 NOTE — Telephone Encounter (Signed)
Refill on prednisone sent to our pharmacy

## 2019-05-21 MED FILL — predniSONE 10 MG TABS: 10 | 30 days supply | Qty: 60 | Fill #0

## 2019-05-21 NOTE — Telephone Encounter (Signed)
Returned call/message given

## 2019-05-23 ENCOUNTER — Other Ambulatory Visit (HOSPITAL_COMMUNITY): Payer: Self-pay

## 2019-05-23 NOTE — Progress Notes (Signed)
Paramedicine Encounter    Patient ID: James [name], male    DOB: 04/07/1960, 59 y.o.   MRN: [id number]   Patient Care Team: [name], MD as PCP - General (Pulmonary Disease) O'Neal, Cassie Freer, MD as PCP - Cardiology (Cardiology)  Patient Active Problem List   Diagnosis Date Noted  . Other atopic dermatitis 05/01/2019  . Acute on chronic respiratory failure with hypoxia and hypercapnia ([place]) 04/23/2019  . Intellectual disability 04/23/2019  . 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 Marijuana abuse 08/09/2018  . Community acquired pneumonia of right lower lobe of lung 05/17/2018  . COPD exacerbation ([place]) 02/16/2018  . Hypertension 05/17/2016  . Low back pain radiating to both legs 04/18/2016  . History of tobacco abuse 04/06/2016  . Alcohol dependence syndrome ([place]) 04/06/2016  . COPD mixed type ([place]) 03/26/2016    Current Outpatient Medications:  .  albuterol (PROVENTIL) (2.5 MG/3ML) 0.083% nebulizer solution, Take 3 mLs (2.5 mg total) by nebulization every 6 (six) hours as needed for wheezing or shortness of breath., Disp: 150 mL, Rfl: 0 .  albuterol (VENTOLIN HFA) 108 (90 Base) MCG/ACT inhaler, Inhale 2 puffs into the lungs every 6 (six) hours as needed for wheezing or shortness of breath., Disp: 6.7 g, Rfl: 0 .  folic acid (FOLVITE) 1 MG tablet, Take 1 tablet (1 mg total) by mouth daily., Disp: 90 tablet, Rfl: 0 .  losartan (COZAAR) 100 MG tablet, Take 1 tablet (100 mg total) by mouth daily., Disp: 30 tablet, Rfl: 0 .  mometasone-formoterol (DULERA) 200-5 MCG/ACT AERO, Inhale 2 puffs into the lungs 2 (two) times daily., Disp: 13 g, Rfl: 0 .  predniSONE (DELTASONE) 10 MG tablet, Take 2 tablets daily, Disp: 60 tablet, Rfl: 0 .  Respiratory Therapy Supplies (FLUTTER) DEVI, Use 4 times daily, Disp: 1 each, Rfl: 0 .  Spacer/Aero-Holding Chambers (AEROCHAMBER MV) inhaler, Use as instructed, Disp: 1 each, Rfl: 0 .  tiotropium (SPIRIVA HANDIHALER) 18 MCG inhalation capsule, Place 1  capsule (18 mcg total) into inhaler and inhale daily., Disp: 30 capsule, Rfl: 0 .  triamcinolone cream (KENALOG) 0.1 %, Apply 1 application topically 2 (two) times daily., Disp: 30 g, Rfl: 0 .  feeding supplement, ENSURE ENLIVE, (ENSURE ENLIVE) LIQD, Take 237 mLs by mouth 2 (two) times daily between meals. (Patient not taking: Reported on 05/02/2019), Disp: 14220 mL, Rfl: 0 .  hydrOXYzine (ATARAX/VISTARIL) 25 MG tablet, Take 1 tablet (25 mg total) by mouth 3 (three) times daily as needed. (Patient not taking: Reported on 05/02/2019), Disp: 30 tablet, Rfl: 0 .  thiamine (VITAMIN B-1) 100 MG tablet, Take 1 tablet (100 mg total) by mouth daily. (Patient not taking: Reported on 05/02/2019), Disp: 30 tablet, Rfl: 3 Allergies  Allergen Reactions  . Gabapentin Other (See Comments)    hallucinations      Social History   Socioeconomic History  . Marital status: Divorced    Spouse name: Not on file  . Number of children: Not on file  . Years of education: Not on file  . Highest education level: Not on file  Occupational History  . Occupation: Dealer  . Occupation: Painter  Tobacco Use  . Smoking status: Former Smoker    Packs/day: 1.50    Years: 40.00    Pack years: 60.00    Types: Cigarettes    Quit date: 2014    Years since quitting: 7.3  . Smokeless tobacco: Current User    Types: Chew  Substance and Sexual Activity  .  Alcohol use: Yes    Alcohol/week: 28.0 standard drinks    Types: 28 Cans of beer per week    Comment: 4 beers a night; + jitters with not drinking, denies DTs or seizures  . Drug use: Yes    Types: Marijuana    Comment: daily; quit using crack and methamphetamine about 5 months ago   . Sexual activity: Not Currently    Partners: Female  Other Topics Concern  . Not on file  Social History Narrative   Lives alone near [place]   Social Determinants of Health   Financial Resource Strain: High Risk  . Difficulty of Paying Living Expenses: Hard  Food  Insecurity: Unknown  . Worried About Charity fundraiser in the Last Year: Not on file  . Ran Out of Food in the Last Year: Never true  Transportation Needs: No Transportation Needs  . Lack of Transportation (Medical): No  . Lack of Transportation (Non-Medical): No  Physical Activity: Inactive  . Days of Exercise per Week: 0 days  . Minutes of Exercise per Session: 0 min  Stress: Stress Concern Present  . Feeling of Stress : To some extent  Social Connections: Slightly Isolated  . Frequency of Communication with Friends and Family: Three times a week  . Frequency of Social Gatherings with Friends and Family: Once a week  . Attends Religious Services: 1 to 4 times per year  . Active Member of Clubs or Organizations: Yes  . Attends Archivist Meetings: 1 to 4 times per year  . Marital Status: Divorced  Human resources officer Violence:   . Fear of Current or Ex-Partner:   . Emotionally Abused:   [name] Physically Abused:   . Sexually Abused:     Physical Exam      Future Appointments  Date Time Provider [place]  05/29/2019  8:30 AM [name], MD [facility] None  07/03/2019  2:30 PM [name], MD [facility] None    BP 124/70   Pulse (!) 120   Temp 99.9 F (37.7 C)   Resp (!) 22   SpO2 98%   Pt just got in from working.  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 He states his breathing is not doing good.   He is c/o a sore spot on his foot--its a large red spot to the top of his foot. It looks like his shoe may be rubbing a raw spot on top of his foot from where he has been bending over a lot.  The rash on his arm is pink/red in color.  minimal itching but the scabs drying up.  He does have an indoor cat now and he does report it scratched him several weeks ago. He states he has had ringworm before and this looks very similar. Not sure if the place on his foot is related to the rash that has been on his arm or not. Place on foot is  tender but does not report itching right now.   meds verified, he is out of his thiamine. He states he will go to pharmacy to get it.   He is sob just moving minimally.  He reports using his inhaler today.  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   Lungs exp wheezing all over. He is going to take albuterol tx once I leave.   [name], [place] [facility] Paramedic  05/23/19

## 2019-05-29 ENCOUNTER — Ambulatory Visit: Payer: Medicaid Other | Admitting: Critical Care Medicine

## 2019-05-29 NOTE — Progress Notes (Deleted)
olu  Subjective:    Patient ID: James [name], male    DOB: [birth year], 59 y.o.   MRN: [id number]  58 y.o.M with advanced Copd   I saw this patient a week ago and prescribed antibiotics and prednisone for what I perceived to be persisting pneumonia.  The patient had a slight improvement but then worsened and came to the emergency room the end of the week and was admitted for 3 days for COPD exacerbation and lower lobe pneumonia.  Chest x-ray did show persistent infiltrates in the lower lobes.  The patient is now discharged and returns to the office for post hospital follow-up.  Below is the discharge summary Dc summary : Admit date: 07/31/2018 Discharge date: 08/01/2018  Time spent: 45 minutes  Recommendations for Outpatient Follow-up:  Patient will be discharged to home.  Patient will need to follow up with primary care provider within one week of discharge.  Patient should continue medications as prescribed.  Patient should follow a heart healthy diet.   Discharge Diagnoses:  Acute on chronic respiratory failure with hypoxia secondary to COPD Exacerbation and pneumonia Essential hypertension  Discharge Condition: Stable  Diet recommendation: heart healthy  Filed Weights  07/31/18 0231 07/31/18 0602 Weight: 85.3 kg 83.4 kg   History of present illness:  on 07/31/2018 by James [name] a 59 y.o.malewith medical history significant ofCOPD. Patient has had 3 prior admits for COPD / PNA thus far this year, most recently in May.  Patient has been being treated for COPD exacerbation and BLL CAP as outpatient by James [name] with levaquin and prednisone. Just finished levaquin yesterday. Prednisone remained at 40mg  daily with plans to initiate a slow taper.  Despite this, patient developed severe SOB and respiratory distress this evening. EMS called and patient noted to be satting 80% on RA. He is not on chronic O2 for his COPD.  Hospital Course:    Acute on chronic respiratory failure with hypoxia secondary to COPD Exacerbation and pneumonia -Patient noted to have oxygen saturations in the 80s on room air -Chest x-ray noted for pneumonia -Was recently placed on Levaquin and steroids by PCP approximately 1 week ago -On admission, patient continued to have wheezing well into his hospital stay day 1.  Patient currently still has some wheezing however has improved. -Was placed on Solu-Medrol, along with nebulizer treatments, azithromycin and ceftriaxone -Patient did require oxygen upon admission however has been able to ambulate and maintain oxygen saturations in the high 90s on room air. -Will discharge patient with prednisone taper, azithromycin and Ceftin. -Given that patient had oxygen saturations in the 80s on admission, it was thought that he would require several days in the hospital. Patient actually improved quicker than expected.  Essential hypertension -Continue losartan  Since discharge the patient is tried to go back to work but has extreme difficulty in the heat with this.  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  10/05/2018 This patient's not been seen since the end of July he had failed several follow-up visits.  He did see Dr. Shyrl [name] end of August who did not make major changes in  his medication program.  He is to remain on [name] and Incruse.  The patient after being admitted in July got readmitted early August and then again in early September.  Discharge summaries are as noted below.  During the August admission the patient underwent bronchoscopy and had mucous plugging removed the culture showed normal  flora Note this patient has not been actively smoking recently Admit date: 08/09/2018 Discharge date: 08/11/2018  Discharge Diagnoses:  Acute on chronic hypoxic respiratory failure/sepsis due to multifocal pneumonia COPD Alcohol/marijuana use Essential hypertension  History of present illness:  on 08/09/2018 by James [name] a 59 y.o.malewith medical history significant ofCOPD and ETOH dependence presenting with respiratory distress.He woke up about 530 this AM and couldn't breathe. This just keeps happening. He felt similarly when hospitalized last week. He has been doing fine until today. No h/o OSA, not on CPAP. Not on home O2. +cough, productive of yellowish sputum. It is sometimes hard to get the sputum up. No fever. +wheezing since his last hospitalization. Quit smoking in 2014. He lives in a non-air-conditioned log cabin.  He was hospitalized from 7/27-28 for acute on chronic respiratory failure associated with COPD exacerbation and PNA. This was his 4th admission this year. He was seen by James [name] on 7/30 with plan for completion of course of Keflex and prednisone, as well as prednisone taper.  Hospital Course:  Acute on chronic hypoxic respiratory failure/sepsis due to multifocal pneumonia -Was recently hospitalized in late July for pneumonia and COPD exacerbation. Was discharged with antibiotics as well as prednisone taper. Patient followed up with James [name] on 08/03/2018 with completion of Keflex and prednisone as well. -This is his fourth admission this year for similar presentation -Patient presented with cough and shortness of breath -Chest x-rayon admissionfindings consistent with right-sided multifocal pneumonia -CTA chest showed multifocal pneumonia on the right. COPD including emphysema. Negative for PE. -COVID negative -Upon admission, patient had leukocytosis, fever, tachycardia, tachypnea- all resolving  -Noted to be  hypoxic 88% on room air in the ED -Urine strep pneumoniaand legionellaantigensnegative -Sputum culture: Few GPC, GVR, rare yeast -Blood cultures show no growth -was placed on vancomycin, Zosyn, Solu-Medrol, supplemental oxygen -S/p bronchoscopy 8/6, pending culture results -CXR obtained after bronch on 8/6: almost complete clearing of the hazy infiltrates in the right lung -Discussed with Dr. Tamala Julian (pulm), recommended prednisone taper starting with prednisone 40mg  daily x 1 week, taper as an outpatient, along with Augmentin. He will follow up with the patient in one week. Given that infection cleared on xray so quickly, question if this is inflammatory vs infection.  -patient able to ambulate today without oxygen and maintained oxygen saturations in the mid 90s- therefore patient does not qualify for home oxygen -patient also had a swallow eval- mild aspiration risk  COPD -Continue nebulizer treatments, steroids, inhalers -Incentive spirometry, flutter valve  Alcohol/marijuana use -Discussed cessation -was placed on continue CIWA protocol  Essential hypertension -Continue Cozaar  Consultants PCCM- pulmonology   Procedures Bronchoscopy   Recommendations for Outpatient Follow-up:  1. Follow up with PCP in 1-2 weeks 2. Please obtain BMP/CBC in one week your next doctors visit.  3. Augmentin and azithromycin for 5 days 4. Advised to continue using bronchodilators every 4-6 hours for the next 4-5 days and then transition to as needed. 5. Advised to take his home prednisone, for next 3 days advised to increase it to 40 mg twice daily then resume back is 20 mg daily  Discharge Condition: Stable  CODE STATUS: Full code Diet recommendation: 2 g salt   DC summary from 09/11/18  Adm 9/7  D/C 09/13/18 Brief/Interim Summary: James [name] a 59 y.o.malewith history ofCOPD on prednisone, HTN, alcohol use disorder, marijuana use,tobacco user. Patient presented secondary to  dyspnea and found to have evidence of pneumonia and a COPD exacerbation in addition to sepsis physiology on admission.  There is concerns of possible right lower lobe pneumonia therefore started on Unasyn and azithromycin due to concerns of aspiration.  This was transitioned to Augmentin and azithromycin for total of 5 days at time of discharge.  Secondary to infection, he was having COPD exacerbation therefore on bronchodilators.  He started improving and on the day of discharge he was adamant about going home as he was feeling much better.  He was still having abnormal breath sounds and cough therefore I had requested him to stay another day in the hospital but still wanted to go home.  Instructions were given to him take 40 mg of prednisone for next 3 days and then he can resume back his 20 mg daily.  Advised... home bronchodilators periodically every 4-6 hours for next 4-5 days and then transition to as needed. Advised to follow-up with his PCP in about 1 week.   Discharge Diagnoses:  Active Problems:   Hypertension   COPD exacerbation ([place])   Marijuana abuse   Pneumonia   Sepsis ([place])  Acute respiratory distress secondary to right lower lobe pneumonia, community-acquired/aspiration Acute mild exacerbation of COPD Concern for possible aspiration.  Unasyn and azithromycin transition to Augmentin and azithromycin.  We will give him 5 more day course at the time of discharge 40 mg of prednisone for next 3 days then transition to 20 mg daily which is his home dose Advised to use bronchodilators.3  Alcohol use disorder Counseled to quit drinking alcohol.  Advised to continue thiamine, folate and multivitamin  Weakness -He did well with physical therapy.  Tobacco use Marijuana use Cessation discussed on admission  Consultations:  None  Subjective: Still having some cough and congestion but he is adamant about going home today as he is feeling much better than from admission.  I  recommended he stay in the hospital for at least 1 more day to help him feel even better.  He wished to go home. Ambulating in the hallway without additional saturating greater than 90% on room air.   Since discharge this patient went to urgent care on 21 September with an outbreak of shingles over his left lower back.  He is taken a course of valacyclovir and prednisone.  He has severe pain.  He was given gabapentin but this caused hallucinations and then we tried tramadol but this caused severe nausea and vomiting.  Apparently the patient cannot take Tylenol or nonsteroidal anti-inflammatories as well.  The patient states his breathing is been labored.  He still has productive cough of white mucus.  He has no fever.  He has significant back pain because of the shingles. The patient is maintaining the [facility] and Incruse as well as his antihypertensive medications and vitamins     10/12/2018  This patient is seen in return follow-up for acute shingles with postherpetic neuropathy, recent pneumonia, COPD exacerbation, and hypertension.  Patient is markedly improved from the last visit.  He has less cough less shortness of breath.  He has improved pain around the area of the shingles.  He is taking amitriptyline 25 mg at bedtime  The patient notes less  pain.  He does state he had been using the tramadol without nausea or vomiting.  I asked if patient would be interested in the para medicine program and expressed interest in the community para medicine program  11/23/2018 Patient seen in return follow-up and has noted increasing shortness of breath and we had to resume prednisone this patient.  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  He does maintain his inhalers.  He is drinking 4 beers a night.  He is not smoking.  Note the patient continues to live in a home that has standing water under the foundation and in several the rooms and mold on the carpet.  We have connected this patient with legal  aid to see if they can assist him in getting into better housing.  The patient's not had previous allergy testing   12/13/2018 The patient notes increased shortness of breath, wheezing and cough productive of thick yellow mucus.  He is using the flutter valve but is not being is productive with this.  He denies chest pain or fever.  He is down to 2 a day on the prednisone.  He is maintaining his inhalers. Apparently there is repair is being done on the patient's home and he is decided to stay in the home  02/27/2019 This patient is seen in return follow-up for COPD.  The patient has significant mold exposure in the home.  There has been some mitigation of this but is not complete.  The patient just was in the urgent care for COPD exacerbation a week ago treated with a course of antibiotics and prednisone.  He is now off prednisone. Patient states the pain in the back has resolved however he is still having increased shortness of breath.  He still having productive cough of thick yellow mucus.  05/01/2019  Since the last OV the patient has been admitted for Copd flare again. Below is the discharge summary Dc summary:  Admit Date: 04/23/2019 Discharge date: 04/27/2019  Recommendations for Outpatient Follow-up:  1. Follow up with PCP in 1-2 weeks 2. Please obtain CMP/CBC in one week 3. Please repeat chest x-ray in the next 6 to 8 weeks to document resolution of infiltrates, if not may require further work-up.     Brief Narrative: Patient is a61 y.o.malewith history of COPD, intellectual disability, HTN who presented with shortness of breath-found to have acute hypoxic respiratory failure secondary to pneumonia and COPD exacerbation. Patient initially required BiPAP on admission. See below for further details.  Significant events: 4/19>> admit to [facility] for severe hypoxemia requiring BiPAP 4/20>> liberated from BiPAP  Antimicrobial therapy: Rocephin 4/18>>4/23 Zithromax  4/18>>4/22  Microbiology data: Blood culture: 4/19>> negative  Procedures : None  Consults: None  Brief Hospital Course: Acute hypoxic/hypercarbic respiratory failure secondary to COPD exacerbation and pneumonia:Improved-initially required BiPAP on admission-titrated off BiPAP on 4/20.Now on room air with stable O2 saturations.  Feels much better-treated with Rocephin and Zithromax-[facility] on discharge.  He will resume his usual inhaler regimen-he will be placed on 20 mg of p.o. prednisone until he is seen by James [name].  PCP to repeat a chest x-ray in 6 to 8 weeks to document resolution of infiltrates-if not he may require further work-up to rule out underlying malignancy.  (Per patient's sister-patient probably has been taking more prednisone than he should be-he is unable to read-and is not able to follow directions)  HTN: BPstable-continue losartan  EtOH abuse:No signs of withdrawal-managed with  Ativan per protocol. Per patient's sister-he drinks around 2-3 bottles of beer on a daily basis.  Intellectual disability:Spoke at length with patient's sister (James [name]) today-patient isa illiterate-and has been living on his own for all his life. His other sister lives a [place] and checks on him daily. Sister acknowledges poor living conditions-but apparently this is the way that patient has lived all his life-and chooses to live this way. 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 Patient is currently managing all his finances. James [name] does acknowledge that patient will not let any home health services come to his house-he will not go to SNF. Difficult situation-but even though patient is an illiterate/intellectual disability-he understand that he is somewhat deconditioned from his acute illness-and is in need of some help-he does not want therapy services-he understands the risk of falls from being weak.  This MD had numerous  discussions with him regarding his appropriate disposition-all he wants to do is go home-his sister-James [name] wants to have him come to her place in [place], [place] for a few weeks-however the patient really does not want to go there-and wants to go home.  I have discussed at length with social worker as well-at this point he is being discharged home at his own request.  His family will keep an eye on him.   Nutrition Problem: Nutrition Problem: Increased nutrient needs Etiology: chronic illness(COPD) Signs/Symptoms: estimated needs Interventions: Ensure Enlive (each supplement provides 350kcal and 20 grams of protein)  Discharge Diagnoses:  Principal Problem:   Acute on chronic respiratory failure with hypoxia and hypercapnia (HCC) Active Problems:   Hypertension   COPD exacerbation ([place])   Community acquired pneumonia of right lower lobe of lung   Marijuana abuse   Intellectual disability  Since discharge the patient has completed his antibiotics for his right lower lobe pneumonia.  He is on tapering prednisone.  He has developed a rash on his arms and face.  The rash is pruritic in nature.  He has been maintaining his medications as prescribed.     Shortness of Breath This is a chronic problem. The current episode started more than 1 year ago. The problem occurs daily (qhs shortness of breath). The problem has been gradually improving. Associated symptoms include PND, sputum production and wheezing. Pertinent negatives include no chest pain, claudication, fever, headaches, hemoptysis, leg pain, leg swelling, orthopnea, rash, sore throat, syncope or vomiting. The symptoms are aggravated by any activity and weather changes. Associated symptoms comments: Pain radiates to arm Legs go to sleep No dysphagia No gerd . Risk factors include smoking. He has tried beta agonist inhalers, oral steroids and steroid inhalers for the symptoms. The treatment provided moderate relief. His past  medical history is significant for COPD and pneumonia. There is no history of asthma, CAD, DVT, a heart failure or PE.    Past Medical History:  Diagnosis Date  . Acute on chronic respiratory failure with hypoxia ([place]) 06/08/2013  . CAP (community acquired pneumonia) 05/17/2018   See admit 05/17/18 ? rml  with   covid pcr neg - rx augmentin > f/u cxr in 4-6 weeks is fine unless condition declines   . COPD (chronic obstructive pulmonary disease) ([place])    not on home  . Dyspnea   . Hypertension   . Pneumonia 04/06/2016  . Pneumothorax    2016, fell from ladder  . 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 Tick bite 06/03/2018     Family History  Problem Relation Age of Onset  .  Heart disease Father   . COPD Sister      Social History   Socioeconomic History  . Marital status: Divorced    Spouse name: Not on file  . Number of children: Not on file  . Years of education: Not on file  . Highest education level: Not on file  Occupational History  . Occupation: Dealer  . Occupation: Painter  Tobacco Use  . Smoking status: Former Smoker    Packs/day: 1.50    Years: 40.00    Pack years: 60.00    Types: Cigarettes    Quit date: 2014    Years since quitting: 7.4  . Smokeless tobacco: Current User    Types: Chew  Substance and Sexual Activity  . Alcohol use: Yes    Alcohol/week: 28.0 standard drinks    Types: 28 Cans of beer per week    Comment: 4 beers a night; + jitters with not drinking, denies DTs or seizures  . Drug use: Yes    Types: Marijuana    Comment: daily; quit using crack and methamphetamine about 5 months ago   . Sexual activity: Not Currently    Partners: Female  Other Topics Concern  . Not on file  Social History Narrative   Lives alone near [place]   Social Determinants of Health   Financial Resource Strain: High Risk  . Difficulty of Paying Living Expenses: Hard  Food Insecurity: Unknown  . Worried About Charity fundraiser in the Last Year: Not on  file  . Ran Out of Food in the Last Year: Never true  Transportation Needs: No Transportation Needs  . Lack of Transportation (Medical): No  . Lack of Transportation (Non-Medical): No  Physical Activity: Inactive  . Days of Exercise per Week: 0 days  . Minutes of Exercise per Session: 0 min  Stress: Stress Concern Present  . Feeling of Stress : To some extent  Social Connections: Slightly Isolated  . Frequency of Communication with Friends and Family: Three times a week  . Frequency of Social Gatherings with Friends and Family: Once a week  . Attends Religious Services: 1 to 4 times per year  . Active Member of Clubs or Organizations: Yes  . Attends Archivist Meetings: 1 to 4 times per year  . Marital Status: Divorced  Human resources officer Violence:   . Fear of Current or Ex-Partner:   . Emotionally Abused:   [name] Physically Abused:   . Sexually Abused:      Allergies  Allergen Reactions  . Gabapentin Other (See Comments)    hallucinations     Outpatient Medications Prior to Visit  Medication Sig Dispense Refill  . albuterol (PROVENTIL) (2.5 MG/3ML) 0.083% nebulizer solution Take 3 mLs (2.5 mg total) by nebulization every 6 (six) hours as needed for wheezing or shortness of breath. 150 mL 0  . albuterol (VENTOLIN HFA) 108 (90 Base) MCG/ACT inhaler Inhale 2 puffs into the lungs every 6 (six) hours as needed for wheezing or shortness of breath. 6.7 g 0  . folic acid (FOLVITE) 1 MG tablet Take 1 tablet (1 mg total) by mouth daily. 90 tablet 0  . hydrOXYzine (ATARAX/VISTARIL) 25 MG tablet Take 1 tablet (25 mg total) by mouth 3 (three) times daily as needed. (Patient not taking: Reported on 05/02/2019) 30 tablet 0  . losartan (COZAAR) 100 MG tablet Take 1 tablet (100 mg total) by mouth daily. 30 tablet 0  . mometasone-formoterol (DULERA) 200-5 MCG/ACT AERO Inhale 2 puffs  into the lungs 2 (two) times daily. 13 g 0  . predniSONE (DELTASONE) 10 MG tablet Take 2 tablets daily 60  tablet 0  . Respiratory Therapy Supplies (FLUTTER) DEVI Use 4 times daily 1 each 0  . Spacer/Aero-Holding Chambers (AEROCHAMBER MV) inhaler Use as instructed 1 each 0  . thiamine (VITAMIN B-1) 100 MG tablet Take 1 tablet (100 mg total) by mouth daily. (Patient not taking: Reported on 05/02/2019) 30 tablet 3  . tiotropium (SPIRIVA HANDIHALER) 18 MCG inhalation capsule Place 1 capsule (18 mcg total) into inhaler and inhale daily. 30 capsule 0  . triamcinolone cream (KENALOG) 0.1 % Apply 1 application topically 2 (two) times daily. 30 g 0   No facility-administered medications prior to visit.     Review of Systems  Constitutional: Negative for fever.  HENT: Negative for sinus pressure, sinus pain, sore throat and trouble swallowing.   Eyes: Negative.   Respiratory: Positive for cough, sputum production, chest tightness, shortness of breath and wheezing. Negative for hemoptysis.   Cardiovascular: Positive for PND. Negative for chest pain, palpitations, orthopnea, claudication, leg swelling and syncope.  Gastrointestinal: Negative for nausea and vomiting.  Genitourinary: Negative.   Musculoskeletal: Positive for back pain.  Skin: Negative for rash.  Neurological: Negative for headaches.  Psychiatric/Behavioral: Negative for hallucinations. The patient is not nervous/anxious and is not hyperactive.        Objective:   Physical Exam There were no vitals filed for this visit.  Gen: , well-nourished, in no distress,   ENT: No lesions,  mouth clear,  oropharynx clear, no postnasal drip  Neck: No JVD, no TMG, no carotid bruits  Lungs: No use of accessory muscles, no dullness to percussion, inspiratory and expiratory wheeze with poor airflow  Cardiovascular: RRR, heart sounds normal, no murmur or gallops, no peripheral edema  Abdomen: soft and NT, no HSM,  BS normal  Musculoskeletal: No deformities, no cyanosis or clubbing  Neuro: alert, non focal  Skin: Warm, rash over arms and  face  Disability eval at [facility]:   Pulmonary Function Test (04/19/2019 1:42 PM EDT) Pulmonary Function Test (04/19/2019 1:42 PM EDT)  Component Value Ref Range Performed At Pathologist Signature  FVC Post 2.51 L [facility]   FEV1 Post 1.55 L [facility]   FEV1/FVC Post 61.87 % [facility]   FEF25-75% Post 0.90 L/s [facility]   PEF Post 3.42 L/s [facility]   MVV Post 42.00 L/min [facility]   FVC Pre 2.92 L [facility]   FEV1 Pre 1.57 L [facility]   FEV1/FVC Pre 54.01 % [facility]   FEF25-75% Pre 0.68 L/s [facility]   PEF Pre 4.14 L/s [facility]   MVV Pre 45.00 L/min [facility]   FVC_LLN 3.73  [facility]   FVC_Z-SCORE -2.84  [facility]   FVC_%PRED 60 % % [facility]   FVC_Z-SCORE -2.84  [facility]   FVC_%PRED 52 % % [facility]   FVC %Chng -14 % % [facility]   FEV1_LLN 2.86  [facility]   FEV1_Z-SCORE -3.78  [facility]   FEV1_%PRED 42 % % [facility]   FEV1_Z-SCORE -3.78  [facility]   FEV1_%PRED 41 % % [facility]  FEV1 %Chng -2 % % [facility]   FEV1/FVC_LLN 66  [facility]   FEV1/FVC %Chng 15 % % [facility]   FEF25-75%_LLN 1.60  [facility]   FEF25-75%_Z-SCORE -3.05  [facility]   FEF25-75%_%PRED 21 % % [facility]   FEF25-75%_Z-SCORE -3.05  [facility]   FEF25-75%_%PRED 28 % % [facility]   FEF25-75% %Chng 33 % % [facility]   PEF_LLN 7.25  [facility]    PEF_%PRED 43 % 36 % % [facility]   PEF %Chng -17 % % [facility]   MVV_LLN 130  [facility]   MVV_%PRED 35 % 32 % % [facility]   MVV %Chng -7 % % [facility]     Media Information    Document Information  Photos    11/02/2018 14:43  Attached To:  [name]  Source Information  [name], MD  [facility]    2/15 CXR:/ABD NAD   BMP Latest Ref Rng & Units 04/26/2019 04/25/2019 04/24/2019  Glucose 70 - 99 mg/dL 158(H) 152(H) 145(H)  BUN 6 - 20 mg/dL 19 21(H) 13  Creatinine 0.61 - 1.24 mg/dL 0.71 0.77 0.66  BUN/Creat Ratio 9 - 20 - - -  Sodium 135 - 145 mmol/L 140 141 142  Potassium 3.5 - 5.1 mmol/L 4.3 4.4 4.2  Chloride 98 - 111 mmol/L 104 103 106  CO2 22 - 32 mmol/L 27 28 28   Calcium 8.9 - 10.3 mg/dL 9.2 9.5 8.8(L)   Hepatic Function Panel     Component Value Date/Time   PROT 5.9 (L) 04/23/2019 1037   PROT 6.0 06/20/2018 1159   ALBUMIN 3.4 (L) 04/23/2019 1037   ALBUMIN 4.1 06/20/2018 1159   AST 28 04/23/2019 1037   ALT 26 04/23/2019 1037   ALT 62 (H) 06/23/2016 1226   ALKPHOS 34 (L) 04/23/2019 1037   BILITOT 0.5 04/23/2019 1037   BILITOT 0.4 06/20/2018 1159   BILIDIR 0.2 04/23/2019 1037   BILIDIR 0.12 04/16/2016 1601   IBILI 0.3 04/23/2019 1037   CBC Latest Ref Rng & Units 04/26/2019 04/25/2019 04/24/2019  WBC 4.0 - 10.5 K/uL 16.6(H) 15.8(H) 17.4(H)  Hemoglobin 13.0 - 17.0 g/dL 14.2 14.6 13.3  Hematocrit 39.0 - 52.0 % 43.5 45.2 41.1  Platelets 150 - 400 K/uL 251 276 262       Assessment & Plan:  I personally reviewed all images and lab data in the [facility] system as well as any outside material available during this office visit and agree with the  radiology impressions.   No problem-specific Assessment & Plan notes found for this encounter.   There are no diagnoses linked to this encounter.

## 2019-05-31 ENCOUNTER — Other Ambulatory Visit: Payer: Self-pay | Admitting: Critical Care Medicine

## 2019-06-01 MED FILL — LOSARTAN POTASSIUM 100 MG T: 100 | 30 days supply | Qty: 30 | Fill #0

## 2019-06-20 ENCOUNTER — Encounter (HOSPITAL_COMMUNITY): Payer: Self-pay

## 2019-06-20 ENCOUNTER — Telehealth (HOSPITAL_COMMUNITY): Payer: Self-pay

## 2019-06-20 ENCOUNTER — Emergency Department (HOSPITAL_COMMUNITY): Payer: Medicaid Other

## 2019-06-20 ENCOUNTER — Other Ambulatory Visit: Payer: Self-pay

## 2019-06-20 ENCOUNTER — Inpatient Hospital Stay (HOSPITAL_COMMUNITY)
Admission: EM | Admit: 2019-06-20 | Discharge: 2019-06-22 | DRG: 871 | Disposition: A | Discharge status: Home / Self Care | Payer: Medicaid Other | Attending: Internal Medicine | Admitting: Internal Medicine

## 2019-06-20 DIAGNOSIS — J189 Pneumonia, unspecified organism: Secondary | ICD-10-CM | POA: Diagnosis not present

## 2019-06-20 DIAGNOSIS — R0603 Acute respiratory distress: Secondary | ICD-10-CM | POA: Diagnosis not present

## 2019-06-20 DIAGNOSIS — I10 Essential (primary) hypertension: Secondary | ICD-10-CM | POA: Diagnosis not present

## 2019-06-20 DIAGNOSIS — F79 Unspecified intellectual disabilities: Secondary | ICD-10-CM | POA: Diagnosis present

## 2019-06-20 DIAGNOSIS — Z8249 Family history of ischemic heart disease and other diseases of the circulatory system: Secondary | ICD-10-CM

## 2019-06-20 DIAGNOSIS — R0602 Shortness of breath: Secondary | ICD-10-CM | POA: Diagnosis not present

## 2019-06-20 DIAGNOSIS — A419 Sepsis, unspecified organism: Principal | ICD-10-CM | POA: Diagnosis present

## 2019-06-20 DIAGNOSIS — Z720 Tobacco use: Secondary | ICD-10-CM

## 2019-06-20 DIAGNOSIS — L209 Atopic dermatitis, unspecified: Secondary | ICD-10-CM | POA: Diagnosis present

## 2019-06-20 DIAGNOSIS — Z888 Allergy status to other drugs, medicaments and biological substances status: Secondary | ICD-10-CM

## 2019-06-20 DIAGNOSIS — J441 Chronic obstructive pulmonary disease with (acute) exacerbation: Secondary | ICD-10-CM | POA: Diagnosis not present

## 2019-06-20 DIAGNOSIS — R251 Tremor, unspecified: Secondary | ICD-10-CM | POA: Diagnosis present

## 2019-06-20 DIAGNOSIS — Z20822 Contact with and (suspected) exposure to covid-19: Secondary | ICD-10-CM | POA: Diagnosis present

## 2019-06-20 DIAGNOSIS — Z7289 Other problems related to lifestyle: Secondary | ICD-10-CM

## 2019-06-20 DIAGNOSIS — F1290 Cannabis use, unspecified, uncomplicated: Secondary | ICD-10-CM | POA: Diagnosis present

## 2019-06-20 DIAGNOSIS — Z7952 Long term (current) use of systemic steroids: Secondary | ICD-10-CM | POA: Diagnosis not present

## 2019-06-20 DIAGNOSIS — Z7951 Long term (current) use of inhaled steroids: Secondary | ICD-10-CM | POA: Diagnosis not present

## 2019-06-20 DIAGNOSIS — D571 Sickle-cell disease without crisis: Secondary | ICD-10-CM | POA: Diagnosis not present

## 2019-06-20 DIAGNOSIS — J80 Acute respiratory distress syndrome: Secondary | ICD-10-CM | POA: Diagnosis not present

## 2019-06-20 DIAGNOSIS — D751 Secondary polycythemia: Secondary | ICD-10-CM | POA: Diagnosis present

## 2019-06-20 DIAGNOSIS — J440 Chronic obstructive pulmonary disease with acute lower respiratory infection: Secondary | ICD-10-CM | POA: Diagnosis present

## 2019-06-20 DIAGNOSIS — Z825 Family history of asthma and other chronic lower respiratory diseases: Secondary | ICD-10-CM | POA: Diagnosis not present

## 2019-06-20 DIAGNOSIS — D7589 Other specified diseases of blood and blood-forming organs: Secondary | ICD-10-CM | POA: Diagnosis present

## 2019-06-20 DIAGNOSIS — R0902 Hypoxemia: Secondary | ICD-10-CM | POA: Diagnosis not present

## 2019-06-20 DIAGNOSIS — R0689 Other abnormalities of breathing: Secondary | ICD-10-CM | POA: Diagnosis not present

## 2019-06-20 DIAGNOSIS — R000 Tachycardia, unspecified: Secondary | ICD-10-CM | POA: Diagnosis not present

## 2019-06-20 LAB — BASIC METABOLIC PANEL
Anion gap: 13 (ref 5–15)
BUN: 12 mg/dL (ref 6–20)
CO2: 27 mmol/L (ref 22–32)
Calcium: 9.6 mg/dL (ref 8.9–10.3)
Chloride: 104 mmol/L (ref 98–111)
Creatinine, Ser: 0.86 mg/dL (ref 0.61–1.24)
GFR calc Af Amer: >60 mL/min (ref >60)
GFR calc non Af Amer: >60 mL/min (ref >60)
Glucose, Bld: 136 mg/dL — ABNORMAL HIGH (ref 70–99)
Potassium: 4.3 mmol/L (ref 3.5–5.1)
Sodium: 144 mmol/L (ref 135–145)

## 2019-06-20 LAB — URINALYSIS, ROUTINE W REFLEX MICROSCOPIC
Bacteria, UA: NONE SEEN
Bilirubin Urine: NEGATIVE
Glucose, UA: >=500 mg/dL — AB
Hgb urine dipstick: NEGATIVE
Ketones, ur: NEGATIVE mg/dL
Leukocytes,Ua: NEGATIVE
Nitrite: NEGATIVE
Protein, ur: NEGATIVE mg/dL
Specific Gravity, Urine: 1.022 (ref 1.005–1.030)
pH: 5 (ref 5.0–8.0)

## 2019-06-20 LAB — I-STAT ARTERIAL BLOOD GAS, ED
Acid-Base Excess: 2 mmol/L (ref 0.0–2.0)
Bicarbonate: 27.5 mmol/L (ref 20.0–28.0)
Calcium, Ion: 1.27 mmol/L (ref 1.15–1.40)
HCT: 50 % (ref 39.0–52.0)
Hemoglobin: 17 g/dL (ref 13.0–17.0)
O2 Saturation: 99 %
Patient temperature: 101.8
Potassium: 4.2 mmol/L (ref 3.5–5.1)
Sodium: 142 mmol/L (ref 135–145)
TCO2: 29 mmol/L (ref 22–32)
pCO2 arterial: 48.6 mmHg — ABNORMAL HIGH (ref 32.0–48.0)
pH, Arterial: 7.368 (ref 7.350–7.450)
pO2, Arterial: 135 mmHg — ABNORMAL HIGH (ref 83.0–108.0)

## 2019-06-20 LAB — LACTIC ACID, PLASMA
Lactic Acid, Venous: 2.6 mmol/L (ref 0.5–1.9)
Lactic Acid, Venous: 4 mmol/L (ref 0.5–1.9)
Lactic Acid, Venous: 5.4 mmol/L (ref 0.5–1.9)
Lactic Acid, Venous: 4.4 mmol/L (ref 0.5–1.9)
Lactic Acid, Venous: 2.9 mmol/L (ref 0.5–1.9)
Lactic Acid, Venous: 1.9 mmol/L (ref 0.5–1.9)

## 2019-06-20 LAB — CBC WITH DIFFERENTIAL/PLATELET
Abs Immature Granulocytes: 0.05 10*3/uL (ref 0.00–0.07)
Basophils Absolute: 0.1 10*3/uL (ref 0.0–0.1)
Basophils Relative: 0 %
Eosinophils Absolute: 0.1 10*3/uL (ref 0.0–0.5)
Eosinophils Relative: 1 %
HCT: 52.5 % — ABNORMAL HIGH (ref 39.0–52.0)
Hemoglobin: 17.1 g/dL — ABNORMAL HIGH (ref 13.0–17.0)
Immature Granulocytes: 0 %
Lymphocytes Relative: 12 %
Lymphs Abs: 2.1 10*3/uL (ref 0.7–4.0)
MCH: 33.6 pg (ref 26.0–34.0)
MCHC: 32.6 g/dL (ref 30.0–36.0)
MCV: 103.1 fL — ABNORMAL HIGH (ref 80.0–100.0)
Monocytes Absolute: 0.6 10*3/uL (ref 0.1–1.0)
Monocytes Relative: 4 %
Neutro Abs: 14.1 10*3/uL — ABNORMAL HIGH (ref 1.7–7.7)
Neutrophils Relative %: 83 %
Platelets: 297 10*3/uL (ref 150–400)
RBC: 5.09 MIL/uL (ref 4.22–5.81)
RDW: 13 % (ref 11.5–15.5)
WBC: 17.1 10*3/uL — ABNORMAL HIGH (ref 4.0–10.5)
nRBC: 0 % (ref 0.0–0.2)

## 2019-06-20 LAB — PHOSPHORUS: Phosphorus: 2.9 mg/dL (ref 2.5–4.6)

## 2019-06-20 LAB — HEPATIC FUNCTION PANEL
ALT: 29 U/L (ref 0–44)
AST: 27 U/L (ref 15–41)
Albumin: 3.8 g/dL (ref 3.5–5.0)
Alkaline Phosphatase: 36 U/L — ABNORMAL LOW (ref 38–126)
Bilirubin, Direct: 0.1 mg/dL (ref 0.0–0.2)
Indirect Bilirubin: 0.6 mg/dL (ref 0.3–0.9)
Total Bilirubin: 0.7 mg/dL (ref 0.3–1.2)
Total Protein: 6.3 g/dL — ABNORMAL LOW (ref 6.5–8.1)

## 2019-06-20 LAB — PROTIME-INR
INR: 0.9 (ref 0.8–1.2)
Prothrombin Time: 12.1 seconds (ref 11.4–15.2)

## 2019-06-20 LAB — MRSA PCR SCREENING: MRSA by PCR: NEGATIVE

## 2019-06-20 LAB — MAGNESIUM: Magnesium: 1.9 mg/dL (ref 1.7–2.4)

## 2019-06-20 LAB — APTT: aPTT: 24 seconds (ref 24–36)

## 2019-06-20 LAB — SARS CORONAVIRUS 2 BY RT PCR (HOSPITAL ORDER, PERFORMED IN ~~LOC~~ HOSPITAL LAB): SARS Coronavirus 2: NEGATIVE

## 2019-06-20 MED ORDER — MOMETASONE FURO-FORMOTEROL FUM 200-5 MCG/ACT IN AERO
2.0000 | INHALATION_SPRAY | Freq: Two times a day (BID) | RESPIRATORY_TRACT | Status: DC
  Filled 2019-06-20: qty 8.8

## 2019-06-20 MED ORDER — ENOXAPARIN SODIUM 40 MG/0.4ML ~~LOC~~ SOLN
40.0000 mg | SUBCUTANEOUS | Status: DC
  Administered 2019-06-20 – 2019-06-22 (×3): 40 mg via SUBCUTANEOUS
  Filled 2019-06-20 (×3): qty 0.4

## 2019-06-20 MED ORDER — LORAZEPAM 1 MG PO TABS: 1.0000 mg | ORAL_TABLET | ORAL | Status: DC | PRN

## 2019-06-20 MED ORDER — LACTATED RINGERS IV BOLUS
1000.0000 mL | Freq: Once | INTRAVENOUS | Status: AC
  Administered 2019-06-20: 1000 mL via INTRAVENOUS

## 2019-06-20 MED ORDER — IPRATROPIUM-ALBUTEROL 0.5-2.5 (3) MG/3ML IN SOLN: 3.0000 mL | RESPIRATORY_TRACT | Status: DC | PRN

## 2019-06-20 MED ORDER — NICOTINE 14 MG/24HR TD PT24
14.0000 mg | MEDICATED_PATCH | Freq: Every day | TRANSDERMAL | Status: DC
  Administered 2019-06-20 – 2019-06-22 (×3): 14 mg via TRANSDERMAL
  Filled 2019-06-20 (×3): qty 1

## 2019-06-20 MED ORDER — SODIUM CHLORIDE 0.9 % IV SOLN
2.0000 g | INTRAVENOUS | Status: DC
  Administered 2019-06-20 – 2019-06-22 (×3): 2 g via INTRAVENOUS
  Filled 2019-06-20 (×2): qty 20
  Filled 2019-06-20: qty 2
  Filled 2019-06-20: qty 20

## 2019-06-20 MED ORDER — UMECLIDINIUM BROMIDE 62.5 MCG/INH IN AEPB
1.0000 | INHALATION_SPRAY | Freq: Every day | RESPIRATORY_TRACT | Status: DC
  Administered 2019-06-21 – 2019-06-22 (×2): 1 via RESPIRATORY_TRACT
  Filled 2019-06-20 (×2): qty 7

## 2019-06-20 MED ORDER — ACETAMINOPHEN 325 MG PO TABS: 650.0000 mg | ORAL_TABLET | Freq: Four times a day (QID) | ORAL | Status: DC | PRN

## 2019-06-20 MED ORDER — SODIUM CHLORIDE 0.9% FLUSH
3.0000 mL | Freq: Two times a day (BID) | INTRAVENOUS | Status: DC
  Administered 2019-06-20 – 2019-06-22 (×4): 3 mL via INTRAVENOUS

## 2019-06-20 MED ORDER — PREDNISONE 20 MG PO TABS
50.0000 mg | ORAL_TABLET | Freq: Every day | ORAL | Status: DC
  Administered 2019-06-20 – 2019-06-22 (×3): 50 mg via ORAL
  Filled 2019-06-20: qty 2
  Filled 2019-06-20: qty 3
  Filled 2019-06-20: qty 1

## 2019-06-20 MED ORDER — SODIUM CHLORIDE 0.9 % IV SOLN
500.0000 mg | INTRAVENOUS | Status: DC
  Administered 2019-06-20 – 2019-06-22 (×3): 500 mg via INTRAVENOUS
  Filled 2019-06-20 (×5): qty 500

## 2019-06-20 MED ORDER — LORAZEPAM 2 MG/ML IJ SOLN
1.0000 mg | Freq: Once | INTRAMUSCULAR | Status: AC
  Administered 2019-06-20: 1 mg via INTRAVENOUS
  Filled 2019-06-20: qty 1

## 2019-06-20 MED ORDER — TIOTROPIUM BROMIDE MONOHYDRATE 18 MCG IN CAPS: 18.0000 ug | ORAL_CAPSULE | Freq: Every day | RESPIRATORY_TRACT | Status: DC

## 2019-06-20 MED ORDER — LACTATED RINGERS IV SOLN: INTRAVENOUS | Status: DC

## 2019-06-20 MED ORDER — ALBUTEROL (5 MG/ML) CONTINUOUS INHALATION SOLN
10.0000 mg/h | INHALATION_SOLUTION | Freq: Once | RESPIRATORY_TRACT | Status: AC
  Administered 2019-06-20: 10 mg/h via RESPIRATORY_TRACT
  Filled 2019-06-20: qty 20

## 2019-06-20 MED ORDER — MOMETASONE FURO-FORMOTEROL FUM 200-5 MCG/ACT IN AERO
2.0000 | INHALATION_SPRAY | Freq: Two times a day (BID) | RESPIRATORY_TRACT | Status: DC
  Administered 2019-06-20 – 2019-06-22 (×4): 2 via RESPIRATORY_TRACT
  Filled 2019-06-20: qty 8.8

## 2019-06-20 MED ORDER — THIAMINE HCL 100 MG PO TABS
100.0000 mg | ORAL_TABLET | Freq: Every day | ORAL | Status: DC
  Administered 2019-06-20 – 2019-06-22 (×3): 100 mg via ORAL
  Filled 2019-06-20 (×3): qty 1

## 2019-06-20 MED ORDER — ALBUTEROL SULFATE (2.5 MG/3ML) 0.083% IN NEBU: 2.5000 mg | INHALATION_SOLUTION | RESPIRATORY_TRACT | Status: DC | PRN

## 2019-06-20 MED ORDER — FOLIC ACID 1 MG PO TABS
1.0000 mg | ORAL_TABLET | Freq: Every day | ORAL | Status: DC
  Administered 2019-06-20 – 2019-06-22 (×3): 1 mg via ORAL
  Filled 2019-06-20 (×3): qty 1

## 2019-06-20 MED ORDER — MAGNESIUM SULFATE 2 GM/50ML IV SOLN
2.0000 g | Freq: Once | INTRAVENOUS | Status: AC
  Administered 2019-06-20: 2 g via INTRAVENOUS
  Filled 2019-06-20: qty 50

## 2019-06-20 MED ORDER — SODIUM CHLORIDE 0.9 % IV BOLUS
1000.0000 mL | Freq: Once | INTRAVENOUS | Status: AC
  Administered 2019-06-20: 1000 mL via INTRAVENOUS

## 2019-06-20 MED ORDER — METHYLPREDNISOLONE SODIUM SUCC 125 MG IJ SOLR
125.0000 mg | Freq: Once | INTRAMUSCULAR | Status: AC
  Administered 2019-06-20: 125 mg via INTRAVENOUS
  Filled 2019-06-20: qty 2

## 2019-06-20 MED ORDER — ACETAMINOPHEN 650 MG RE SUPP: 650.0000 mg | Freq: Four times a day (QID) | RECTAL | Status: DC | PRN

## 2019-06-20 MED ORDER — ADULT MULTIVITAMIN W/MINERALS CH
1.0000 | ORAL_TABLET | Freq: Every day | ORAL | Status: DC
  Administered 2019-06-20 – 2019-06-22 (×3): 1 via ORAL
  Filled 2019-06-20 (×3): qty 1

## 2019-06-20 MED ORDER — LORAZEPAM 2 MG/ML IJ SOLN: 1.0000 mg | INTRAMUSCULAR | Status: DC | PRN

## 2019-06-20 NOTE — Progress Notes (Signed)
Patient arrived by EMS on CPAP, respiratory distress. Placed on BIPAP, ABG, continuous neb ordered.

## 2019-06-20 NOTE — ED Triage Notes (Signed)
Pt comes via [facility] from home for resp distress that has been going on for several hours, silent chest, initial stats in the 80's on CPAP, PTA duoneb given, hx of COPD

## 2019-06-20 NOTE — ED Notes (Signed)
MD made aware of recent lactic acid 5.4

## 2019-06-20 NOTE — Sepsis Progress Note (Signed)
Notified bedside nurse of need to draw and administer lactic acid and fluid bolus. Lactic acid 2.6 initially, has increased to 4. Additional IVF bolus is ordered and RN will repeat lactic acid after infused.

## 2019-06-20 NOTE — ED Provider Notes (Signed)
[facility] EMERGENCY DEPARTMENT Provider Note   CSN: [id number] Arrival date & time: 06/20/19  0600   History Chief Complaint  Patient presents with  . Respiratory Distress    James [name] is a 59 y.o. male.  The history is provided by the patient and the EMS personnel.  He has history of hypertension, COPD and comes in because of severe dyspnea.  He started having shortness of breath last night and it got progressively worse.  EMS was called and noted oxygen saturation at 80%.  He was given a nebulizer treatment with albuterol and ipratropium and placed on CPAP with improvement to the point where oxygen saturation got up to 94%.  He does admit to a cough productive of a small amount of yellow sputum.  He denies fever, chills, sweats.  He denies chest pain.  He had been intubated in the past, and states that if he needed to be, he wanted to be intubated.  Past Medical History:  Diagnosis Date  . Acute on chronic respiratory failure with hypoxia ([place]) 06/08/2013  . CAP (community acquired pneumonia) 05/17/2018   See admit 05/17/18 ? rml  with   covid pcr neg - rx augmentin > f/u cxr in 4-6 weeks is fine unless condition declines   . COPD (chronic obstructive pulmonary disease) ([place])    not on home  . Dyspnea   . Hypertension   . Pneumonia 04/06/2016  . Pneumothorax    2016, fell from ladder  . 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 Tick bite 06/03/2018    Patient Active Problem List   Diagnosis Date Noted  . Other atopic dermatitis 05/01/2019  . Acute on chronic respiratory failure with hypoxia and hypercapnia ([place]) 04/23/2019  . Intellectual disability 04/23/2019  . 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 Marijuana abuse 08/09/2018  . Community acquired pneumonia of right lower lobe of lung 05/17/2018  . COPD exacerbation ([place]) 02/16/2018  . Hypertension 05/17/2016  . Low back pain radiating to both legs 04/18/2016  . History of tobacco abuse 04/06/2016  . Alcohol  dependence syndrome ([place]) 04/06/2016  . COPD mixed type ([place]) 03/26/2016    Past Surgical History:  Procedure Laterality Date  . VIDEO ASSISTED THORACOSCOPY (VATS)/THOROCOTOMY Left 06/11/2013   Procedure: VIDEO ASSISTED THORACOSCOPY (VATS)/THOROCOTOMY;  Surgeon: Ivin Poot, MD;  Location: [facility];  Service: Thoracic;  Laterality: Left;  [name] VIDEO BRONCHOSCOPY N/A 06/11/2013   Procedure: VIDEO BRONCHOSCOPY;  Surgeon: Ivin Poot, MD;  Location: [facility];  Service: Thoracic;  Laterality: N/A;  . VIDEO BRONCHOSCOPY N/A 08/10/2018   Procedure: VIDEO BRONCHOSCOPY WITH FLUORO;  Surgeon: Candee Furbish, MD;  [facility] ENDOSCOPY;  Service: Endoscopy;  Laterality: N/A;       Family History  Problem Relation Age of Onset  . Heart disease Father   . COPD Sister     Social History   Tobacco Use  . Smoking status: Former Smoker    Packs/day: 1.50    Years: 40.00    Pack years: 60.00    Types: Cigarettes    Quit date: 2014    Years since quitting: 7.4  . Smokeless tobacco: Current User    Types: Chew  Vaping Use  . Vaping Use: Never used  Substance Use Topics  . Alcohol use: Yes    Alcohol/week: 28.0 standard drinks    Types: 28 Cans of beer per week    Comment: 4 beers a night; + jitters with not drinking, denies DTs  or seizures  . Drug use: Yes    Types: Marijuana    Comment: daily; quit using crack and methamphetamine about 5 months ago     Home Medications Prior to Admission medications   Medication Sig Start Date End Date Taking? Authorizing Provider  albuterol (PROVENTIL) (2.5 MG/3ML) 0.083% nebulizer solution Take 3 mLs (2.5 mg total) by nebulization every 6 (six) hours as needed for wheezing or shortness of breath. 04/27/19   Ghimire, Henreitta Leber, MD  albuterol (VENTOLIN HFA) 108 (90 Base) MCG/ACT inhaler Inhale 2 puffs into the lungs every 6 (six) hours as needed for wheezing or shortness of breath. 04/27/19   Ghimire, Henreitta Leber, MD  folic acid (FOLVITE) 1 MG tablet Take 1  tablet (1 mg total) by mouth daily. 04/27/19   Ghimire, Henreitta Leber, MD  hydrOXYzine (ATARAX/VISTARIL) 25 MG tablet Take 1 tablet (25 mg total) by mouth 3 (three) times daily as needed. Patient not taking: Reported on 05/02/2019 05/01/19   [name], MD  losartan (COZAAR) 100 MG tablet TAKE 1 TABLET (100 MG TOTAL) BY MOUTH DAILY. 05/31/19   [name], MD  mometasone-formoterol (DULERA) 200-5 MCG/ACT AERO Inhale 2 puffs into the lungs 2 (two) times daily. 04/27/19   Ghimire, Henreitta Leber, MD  predniSONE (DELTASONE) 10 MG tablet Take 2 tablets daily 05/20/19   [name], MD  Respiratory Therapy Supplies (FLUTTER) DEVI Use 4 times daily 04/27/18   [name], MD  Spacer/Aero-Holding Chambers (AEROCHAMBER MV) inhaler Use as instructed 08/03/18   [name], MD  thiamine (VITAMIN B-1) 100 MG tablet Take 1 tablet (100 mg total) by mouth daily. Patient not taking: Reported on 05/02/2019 08/03/18   [name], MD  tiotropium (SPIRIVA HANDIHALER) 18 MCG inhalation capsule Place 1 capsule (18 mcg total) into inhaler and inhale daily. 04/27/19 07/26/19  Ghimire, Henreitta Leber, MD  triamcinolone cream (KENALOG) 0.1 % Apply 1 application topically 2 (two) times daily. 05/18/19   [name], MD    Allergies    Gabapentin  Review of Systems   Review of Systems  All other systems reviewed and are negative.   Physical Exam Updated Vital Signs BP (!) 195/119   Pulse (!) 134   Temp 99.6 F (37.6 C) (Temporal)   Resp (!) 40   SpO2 98%   Physical Exam Vitals and nursing note reviewed.   59 year old male with CPAP in place, in moderate to severe acute respiratory distress. Vital signs are significant for elevated heart rate, respiratory rate, blood pressure. Oxygen saturation is 98%, which is normal. Head is normocephalic and atraumatic. PERRLA, EOMI. Oropharynx is clear. Neck is nontender and supple without adenopathy or JVD. Back is nontender and there is no CVA  tenderness. Lungs have markedly decreased air movement.  There are diffuse expiratory wheezes without rales or rhonchi. Chest is nontender. Heart has regular rate and rhythm without murmur. Abdomen is soft, flat, nontender without masses or hepatosplenomegaly and peristalsis is normoactive. Extremities have no cyanosis or edema, full range of motion is present. Skin is warm and dry without rash. Neurologic: Mental status is normal, cranial nerves are intact, there are no motor or sensory deficits.  ED Results / Procedures / Treatments   Labs (all labs ordered are listed, but only abnormal results are displayed) Labs Reviewed  CBC WITH DIFFERENTIAL/PLATELET - Abnormal; Notable for the following components:      Result Value   WBC 17.1 (*)    Hemoglobin  17.1 (*)    HCT 52.5 (*)    MCV 103.1 (*)    Neutro Abs 14.1 (*)    All other components within normal limits  I-STAT ARTERIAL BLOOD GAS, ED - Abnormal; Notable for the following components:   pCO2 arterial 48.6 (*)    pO2, Arterial 135 (*)    All other components within normal limits  SARS CORONAVIRUS 2 BY RT PCR (HOSPITAL ORDER, [facility])  CULTURE, BLOOD (ROUTINE X 2)  CULTURE, BLOOD (ROUTINE X 2)  URINE CULTURE  BASIC METABOLIC PANEL  MAGNESIUM  LACTIC ACID, PLASMA  LACTIC ACID, PLASMA  APTT  PROTIME-INR  URINALYSIS, ROUTINE W REFLEX MICROSCOPIC  HEPATIC FUNCTION PANEL    EKG EKG Interpretation  Date/Time:  Wednesday June 20 2019 06:01:59 EDT Ventricular Rate:  135 PR Interval:    QRS Duration: 92 QT Interval:  282 QTC Calculation: 423 R Axis:   75 Text Interpretation: Multifocal atrial tachycardia Anterior infarct, old When compared with ECG of 04/23/2019, No significant change was found Confirmed by Delora Fuel ([id number]) on 06/20/2019 6:08:59 AM   Radiology DG Chest Port 1 View  Result Date: 06/20/2019 CLINICAL DATA:  Shortness of breath EXAM: PORTABLE CHEST 1 VIEW COMPARISON:  None.  FINDINGS: The heart size and mediastinal contours are within normal limits. Interval worsening in the patchy hazy airspace opacity at the right lung base. The left lung is clear. No pleural effusion is present. Again noted are bilateral healed posterior rib fractures. IMPRESSION: Interval worsening in the patchy airspace opacity at the right lower lung, likely due to pneumonia. Would recommend short interval follow-up upon resolution of symptoms and treatment to determine resolution. Electronically Signed   By: Prudencio Pair M.D.   On: 06/20/2019 06:21    Procedures Procedures  CRITICAL CARE Performed by: Delora Fuel Total critical care time: 55 minutes Critical care time was exclusive of separately billable procedures and treating other patients. Critical care was necessary to treat or prevent imminent or life-threatening deterioration. Critical care was time spent personally by me on the following activities: development of treatment plan with patient and/or surrogate as well as nursing, discussions with consultants, evaluation of patient's response to treatment, examination of patient, obtaining history from patient or surrogate, ordering and performing treatments and interventions, ordering and review of laboratory studies, ordering and review of radiographic studies, pulse oximetry and re-evaluation of patient's condition.  Medications Ordered in ED Medications  methylPREDNISolone sodium succinate (SOLU-MEDROL) 125 mg/2 mL injection 125 mg (has no administration in time range)  magnesium sulfate IVPB 2 g 50 mL (has no administration in time range)  albuterol (PROVENTIL,VENTOLIN) solution continuous neb (has no administration in time range)    ED Course  I have reviewed the triage vital signs and the nursing notes.  Pertinent labs & imaging results that were available during my care of the patient were reviewed by me and considered in my medical decision making (see chart for details).      MDM Rules/Calculators/A&P COPD exacerbation.  Old records were reviewed confirming prior hospitalizations for COPD exacerbation and prior intubation.  He is given methylprednisolone, magnesium and is started on a continuous albuterol nebulizer treatment.  CPAP was replaced with BiPAP.  Screening labs are ordered including ABG.  6:21 AM Patient is noted to be febrile, so sepsis pathway is initiated and he is started on empiric antibiotics.  6:38 AM ABG shows compensated chronic respiratory acidosis.  No need for intubation at this point unless  he starts to tire.  WBC is elevated to 17.1 with left shift consistent with sepsis.  7:17 AM Patient seems to be in somewhat less respiratory distress.  CBC shows polycythemia and macrocytosis.  Metabolic panel is pending.  Patient will need to be admitted to a stepdown unit once metabolic panel has resulted.  Case is signed out to Dr. Kingdavid Rumpf.  Final Clinical Impression(s) / ED Diagnoses Final diagnoses:  COPD exacerbation ([place])  Community acquired pneumonia of right lower lobe of lung  Respiratory distress  Polycythemia  Macrocytosis without anemia    Rx / DC Orders ED Discharge Orders    None       Delora Fuel, MD 10/31/23 [phone]

## 2019-06-20 NOTE — ED Notes (Signed)
Pt IV alarming and patient had oxygen off and sats were 84 %.  PT was placed back on 2 L and sats increased 96%.  Pt is alert now and had some foam like appearance around mouth.  Will notify EDP

## 2019-06-20 NOTE — ED Provider Notes (Signed)
Patient presenting with respiratory distress COPD exacerbation also evidence of pneumonia.  Perhaps a component of sepsis.  Lactic acid is less than 4.  Patient was tachycardic when I saw him.  But felt that was due to the fact he was getting continuous nebulizer.  Patient was on BiPAP at the time.  Patient seemed to clear his wheezing only faint wheezing down low in his lungs.  So BiPAP was removed and he was placed on 2 L of nasal cannula oxygen.  Patient not normally on oxygen.  But patient's sats are staying around 94 to 95% on that.  And he feels as if his breathing is better.  Still somewhat tremulous.  Patient did admit 4 beers last night but says he does not drink on a regular basis.  We are thinking that the tremulousness is secondary to the continuous neb.  Patient is followed by community health and wellness.  So is technically unassigned admission.  Patient is apparently been assigned to internal medicine as unassigned admission.  They have not yet called.  Feel that patient may still require stepdown admission since he just came off BiPAP.  But telemetry may be appropriate.  Patient's blood gas looked reasonable.  Patient also received Solu-Medrol.  Covid testing was negative.  Patient was treated with broad-spectrum antibiotics by Dr. Roxanne Mins.  CRITICAL CARE Performed by: Fredia Sorrow Total critical care time: 35 minutes Critical care time was exclusive of separately billable procedures and treating other patients. Critical care was necessary to treat or prevent imminent or life-threatening deterioration. Critical care was time spent personally by me on the following activities: development of treatment plan with patient and/or surrogate as well as nursing, discussions with consultants, evaluation of patient's response to treatment, examination of patient, obtaining history from patient or surrogate, ordering and performing treatments and interventions, ordering and review of laboratory  studies, ordering and review of radiographic studies, pulse oximetry and re-evaluation of patient's condition.    Fredia Sorrow, MD 06/20/19 1007

## 2019-06-20 NOTE — H&P (Addendum)
Date: 06/20/2019               Patient Name:  James [name] MRN: [id number]  DOB: 22-Apr-1960 Age / Sex: 59 y.o., male   PCP: [name], MD         Medical Service: Internal Medicine Teaching Service         Attending Physician: Dr. Aldine Contes, MD    First Contact: Dr. Gilford Rile Pager: [phone]  Second Contact: Dr. Sharon Seller Pager: [phone]       After Hours (After 5p/  First Contact Pager: [phone]  weekends / holidays): Second Contact Pager: [phone]   Chief Complaint: SOB  History of Present Illness: Mr Sellitto is a 59 yo M with Hx of COPD, HTN, Alcohol Use, Intellectual Disability and Atopic dermatitis who presents after waking up very short of breath. He states he woke up feeling like he could not breath around 5:30 am, he endorses fevers at this time as well. He states he felt fine the day before. Of note he was admitted for 4 days due to COPD exacerbation 2/2 PNA 2 months ago. Has been on long Steroid taper prior to previous admission. He does follow with pulmonology, [name]. He drinks daily, about 6 beers, denies prior withdrawal and no significant withdrawal during last admission. He has some tremors in the ED, believed to be 2/2 continuous neb and steroids. He denies nausea, vomiting, Constipation, diarrhea, abdominal pain, or chest pain.   In the ED, he was febrile and tachycardic. He initially required BiPAP but was able to be weaned to [facility] for now. CXR showed RLL PNA (progressed from previous). Labs showed WBC 17 and Hgb 17 (likley hemoconcentrated). Covid negative. VBG showed pH 7.36 and pCO2 48.6 (barely elevated). LA mildly elevated at 2.6. Blood cultures were obtained. Patient to be admitted for COPD exacerbation.  Meds:  No current facility-administered medications on file prior to encounter.   Current Outpatient Medications on File Prior to Encounter  Medication Sig  . albuterol (PROVENTIL) (2.5 MG/3ML) 0.083% nebulizer solution Take 3 mLs (2.5 mg total)  by nebulization every 6 (six) hours as needed for wheezing or shortness of breath.  [name] albuterol (VENTOLIN HFA) 108 (90 Base) MCG/ACT inhaler Inhale 2 puffs into the lungs every 6 (six) hours as needed for wheezing or shortness of breath.  . folic acid (FOLVITE) 1 MG tablet Take 1 tablet (1 mg total) by mouth daily.  . hydrOXYzine (ATARAX/VISTARIL) 25 MG tablet Take 1 tablet (25 mg total) by mouth 3 (three) times daily as needed. (Patient not taking: Reported on 05/02/2019)  . losartan (COZAAR) 100 MG tablet TAKE 1 TABLET (100 MG TOTAL) BY MOUTH DAILY.  . mometasone-formoterol (DULERA) 200-5 MCG/ACT AERO Inhale 2 puffs into the lungs 2 (two) times daily.  . predniSONE (DELTASONE) 10 MG tablet Take 2 tablets daily  . Respiratory Therapy Supplies (FLUTTER) DEVI Use 4 times daily  . Spacer/Aero-Holding Chambers (AEROCHAMBER MV) inhaler Use as instructed  . thiamine (VITAMIN B-1) 100 MG tablet Take 1 tablet (100 mg total) by mouth daily. (Patient not taking: Reported on 05/02/2019)  . tiotropium (SPIRIVA HANDIHALER) 18 MCG inhalation capsule Place 1 capsule (18 mcg total) into inhaler and inhale daily.  [name] triamcinolone cream (KENALOG) 0.1 % Apply 1 application topically 2 (two) times daily.   Allergies: Allergies as of 06/20/2019 - Review Complete 06/20/2019  Allergen Reaction Noted  . Gabapentin Other (See Comments) 10/05/2018   Past Medical History:  Diagnosis  Date  . Acute on chronic respiratory failure with hypoxia ([place]) 06/08/2013  . CAP (community acquired pneumonia) 05/17/2018   See admit 05/17/18 ? rml  with   covid pcr neg - rx augmentin > f/u cxr in 4-6 weeks is fine unless condition declines   . COPD (chronic obstructive pulmonary disease) ([place])    not on home  . Dyspnea   . Hypertension   . Pneumonia 04/06/2016  . Pneumothorax    2016, fell from ladder  . 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 Tick bite 06/03/2018    Family History:  Family History  Problem Relation Age of Onset    . Heart disease Father   . COPD Sister    Social History:  Social History   Tobacco Use  . Smoking status: Former Smoker    Packs/day: 1.50    Years: 40.00    Pack years: 60.00    Types: Cigarettes    Quit date: 2014    Years since quitting: 7.4  . Smokeless tobacco: Current User    Types: Chew  Vaping Use  . Vaping Use: Never used  Substance Use Topics  . Alcohol use: Yes    Alcohol/week: 28.0 standard drinks    Types: 28 Cans of beer per week    Comment: 4 beers a night; + jitters with not drinking, denies DTs or seizures  . Drug use: Yes    Types: Marijuana    Comment: daily; quit using crack and methamphetamine about 5 months ago    Review of Systems: A complete ROS was negative except as per HPI.  Physical Exam: Blood pressure 111/63, pulse (!) 145, temperature (!) 101.8 F (38.8 C), temperature source Rectal, resp. rate (!) 30, height 5\' 9"  (1.753 m), weight 85.7 kg, SpO2 91 %. Physical Exam Constitutional:      General: He is not in acute distress.    Appearance: Normal appearance.     Comments: drowsy  Cardiovascular:     Rate and Rhythm: Normal rate and regular rhythm.     Pulses: Normal pulses.     Heart sounds: Normal heart sounds.  Pulmonary:     Effort: Pulmonary effort is normal. No respiratory distress.     Breath sounds: Wheezing (End expiratory) present.     Comments: Decreased breath sounds at RLL Abdominal:     General: Bowel sounds are normal. There is no distension.     Palpations: Abdomen is soft.     Tenderness: There is no abdominal tenderness.  Musculoskeletal:        General: No swelling or deformity.  Skin:    General: Skin is warm and dry.  Neurological:     General: No focal deficit present.     Mental Status: He is oriented to person, place, and time. Mental status is at baseline.    EKG: personally reviewed my interpretation is sinus tachycardia at 135  CXR: personally reviewed and I agree with worsening of RLL infiltrate  from prior.  Assessment & Plan by Problem: Active Problems:   COPD exacerbation (HCC)  COPD Exacerbation RLL Pneumonia ?Early Sepsis: Patient presented with SOB, Tachypnea, Tachycardia, and Fever. Found to have worsening of RLL infiltrate (compared to 2 months prior when admitted with PNA as well). Treated with BiPAP, Continuous Nebs, Mg, and Solumedrol in ED, able to come of BiPAP before being seen. He was somewhat drowsy, but did receive a dose of ativan. ABG showed normal pH and borderline pCO2. LA was 2.6 and up-trended  to 4. He is s/p 1L IVF BP stable in 250I Systolic, which appears to be his baseline. Of note, he is  s/p VATS for mini thoracotomy with drainage of hemothorax and decortication of LLL by Dr. Prescott Gum on 06/11/13 after traumatic injury from a fall. - Prednisone 50mg  Daily, will need a long wean based on history of chronic use (currently 20mg  Daily at home) - Duonebs q4h PRN - Albuterol nebs q2h PRN - Continue home Dulera and Spiriva - Ceftriaxone and Azithromycin for CAP coverage - Additional 1L bolus, follow by 125cc/hr - Follow LA, CBC - Supplemental O2, BiPAP PRN  Alcohol Use: Daily alcohol use, did not have withdrawal during previous admission, last drink yesterday. Did have withdrawal symptoms in 2015 per notes. - CIWA w. Ativan  HTN: BP at baseline of 370W systolic, will hold home medication in setting of ?Early sepsis with elevated LA.  Intellectual Disability: Per prior notes, he is illiterate, does live alone in a cabin, and functions. Will update sister. - Needs simple directions - Has trouble taking antibiotics at home due to GI Upset - Keep Family Updated  FEN: 1L bolus then 125cc/hr, replete lytes prn, Sips with Meds, Advance when able to remain off BiPAP VTE ppx: Lovenox Code Status: FULL   Dispo: Admit patient to Inpatient with expected length of stay greater than 2 midnights.  Signed: Neva Seat, MD 06/20/2019, 10:46 AM  After 5pm on  weekdays and 1pm on weekends: On Call pager: [phone]

## 2019-06-20 NOTE — Telephone Encounter (Signed)
Pt called me from the hosp to let me know he was admitted, he didn't know I could follow him in the chart.  He told me he got approved for his SS benefits.  I will f/u once he is d/c.  He said in a couple wks he is going [place].  I will see him before he goes.   [name], EMT-Paramedic  06/20/19

## 2019-06-21 LAB — CBC
HCT: 40.4 % (ref 39.0–52.0)
Hemoglobin: 13.2 g/dL (ref 13.0–17.0)
MCH: 33.2 pg (ref 26.0–34.0)
MCHC: 32.7 g/dL (ref 30.0–36.0)
MCV: 101.8 fL — ABNORMAL HIGH (ref 80.0–100.0)
Platelets: 213 10*3/uL (ref 150–400)
RBC: 3.97 MIL/uL — ABNORMAL LOW (ref 4.22–5.81)
RDW: 13 % (ref 11.5–15.5)
WBC: 19 10*3/uL — ABNORMAL HIGH (ref 4.0–10.5)
nRBC: 0 % (ref 0.0–0.2)

## 2019-06-21 LAB — BASIC METABOLIC PANEL
Anion gap: 8 (ref 5–15)
BUN: 15 mg/dL (ref 6–20)
CO2: 26 mmol/L (ref 22–32)
Calcium: 9 mg/dL (ref 8.9–10.3)
Chloride: 106 mmol/L (ref 98–111)
Creatinine, Ser: 0.68 mg/dL (ref 0.61–1.24)
GFR calc Af Amer: >60 mL/min (ref >60)
GFR calc non Af Amer: >60 mL/min (ref >60)
Glucose, Bld: 111 mg/dL — ABNORMAL HIGH (ref 70–99)
Potassium: 4.2 mmol/L (ref 3.5–5.1)
Sodium: 140 mmol/L (ref 135–145)

## 2019-06-21 MED ORDER — LACTATED RINGERS IV SOLN: INTRAVENOUS | Status: DC

## 2019-06-21 MED ORDER — LOSARTAN POTASSIUM 50 MG PO TABS
100.0000 mg | ORAL_TABLET | Freq: Every day | ORAL | Status: DC
  Administered 2019-06-21 – 2019-06-22 (×2): 100 mg via ORAL
  Filled 2019-06-21 (×2): qty 2

## 2019-06-21 NOTE — Progress Notes (Signed)
  Date: 06/21/2019  Patient name: James [name]  Medical record number: [id number]  Date of birth: 01-20-1960   I have seen and evaluated James [name] and discussed their care with the Residency Team.  In brief, patient is a 60 year old male with a past medical history of COPD, hypertension, alcohol use, intellectual disability and atopic dermatitis who presented to the ED with worsening shortness of breath x1 day.  Patient states that on the day of admission he woke up around 5:30 in the morning feeling short of breath.  Patient also complained of associated fevers and chills and associated cough.  Of note, patient was admitted to the hospital for COPD exacerbation secondary to pneumonia couple of months ago.  He was started on a slow steroid taper at that time and follows up with pulmonology ([name]) as an outpatient.  No chest pain, no palpitations, no diaphoresis, no lightheadedness, no syncope, no focal weakness, no headache, no blurry vision, no nausea or vomiting, no diarrhea, no abdominal pain.  Today patient states that he feels much better and would like to go home soon.  PMHx, Fam Hx, and/or Soc Hx : As per resident admit note  Vitals:   06/21/19 0450 06/21/19 0744  BP: 133/89 140/87  Pulse: 85   Resp:    Temp: 98 F (36.7 C) 98.2 F (36.8 C)  SpO2: 93%    General: Awake, alert, oriented x3, NAD CVS: Regular rhythm, normal heart sounds Lungs: Wheezing noted in the left upper and lower lung fields with decreased breath sounds in the right lower lobe Abdomen: Soft, nontender, nondistended, normoactive bowel sounds Extremities: No edema noted, nontender to palpation Psych: Normal mood and affect HEENT: Normocephalic, atraumatic Skin: Warm and dry  Assessment and Plan: I have seen and evaluated the patient as outlined above. I agree with the formulated Assessment and Plan as detailed in the residents' note, with the following changes:   1.  Shortness of breath secondary  to community-acquired pneumonia and resultant COPD exacerbation: -Patient presented to the ED with shortness of breath and fevers and was found to be febrile and tachycardic in the ED initially requiring BiPAP.  Chest x-ray showed worsening right lower lobe infiltrate and patient has associated leukocytosis as well consistent with pneumonia.  There is concern that patient had a right lower lobe pneumonia couple of months ago as well and this appears progressed on the chest x-ray.  Given the patient has had recurrent pneumonia in the same area there is a possibility of postobstructive pneumonia. -We will have patient follow-up closely for repeat chest x-ray in 4 weeks to ensure resolution of infiltrate.  If infiltrate persists patient would likely need bronchoscopy or CT for further evaluation to rule out an obstructive etiology -Continue with duo nebs as needed -Continue Dulera and Spiriva -We will start the patient on ceftriaxone and azithromycin for community-acquired pneumonia coverage -Patient was also noted to have lactic acidosis likely secondary to his underlying infection.  This improved with IV fluids. -Patient was also noted to have mildly worsening leukocytosis today up to 19 likely secondary to steroids given for his COPD exacerbation -Patient still with wheezing on lung exam today.  We will continue with prednisone 50 mg daily with slow taper -no further work-up at this time -Patient will likely be stable for DC home tomorrow if he continues to improve  Aldine Contes, MD 6/17/20211:06 PM

## 2019-06-21 NOTE — Progress Notes (Addendum)
Subjective:  O/N Events: None  [name] was evaluated at bedside. He states that he is doing pretty good. He denies any new concern or symptoms at this time. In particular, he denies chest pain, fevers, or feeling short of breath. He does not feel like he is wheezing very much. I counseled him regarding his reason for admission. All questions and concerns were addressed.   Objective:  Vital signs in last 24 hours: Vitals:   06/20/19 1948 06/20/19 2047 06/20/19 2336 06/21/19 0450  BP: (!) 141/80  132/78 133/89  Pulse: (!) 103  86 85  Resp: 20     Temp: 98.8 F (37.1 C)  98.5 F (36.9 C) 98 F (36.7 C)  TempSrc: Oral  Oral Oral  SpO2: 94% 93% 92% 93%  Weight:      Height:       Physical Exam Constitutional:      General: He is not in acute distress.    Appearance: He is not ill-appearing or toxic-appearing.  Cardiovascular:     Rate and Rhythm: Normal rate and regular rhythm.     Pulses: Normal pulses.     Heart sounds: Normal heart sounds. No murmur heard.  No friction rub. No gallop.   Pulmonary:     Effort: Pulmonary effort is normal.     Breath sounds: No rhonchi or rales.     Comments: Wheezes appreciated in the L upper and lower lung fields. Decreased breath sounds appreciated in the RLL.  Abdominal:     General: Abdomen is flat. Bowel sounds are normal.     Palpations: Abdomen is soft.     Tenderness: There is no abdominal tenderness. There is no guarding.  Neurological:     Mental Status: He is alert.     Assessment/Plan:  Active Problems:   COPD exacerbation (HCC)  COPD Exacerbation with RLL Pneumonia  Patient presented to the [facility] with SHOB, tachycardia, and fever. Found to have worsening of RLL infiltrate (compared to 2 months prior when admitted with PNA as well). On presentation his lactic acid was 2.6 which uptrended to 4.0 he was given fluid resuscitation and his LA corrected to 1.9. Currently on ceftriaxone and azithromycin. His vitals have come  to WNL, and is on RA, resting comfortably. Leukocytosis of 19 today, likely combination of increased prednisone dose and RLL Pneumonia.  - Prednisone 50mg  QD (Will require extended taper as is on 20mg  prednisone at home) - Duonebs Q4H PRN - Albuterol nebs Q2H PRN - Continue home Dulera and Spiriva - Day 2 of Ceftriaxone and Azithromycin for CAP coverage - LR 100 cc/hr infusion for 12 more hours then DC.  - Supplemental O2, BiPAP PRN  Alcohol Use:  Daily ETOH use, did not have withdrawal during previous admission, last drink 06/19/19. Did have withdrawal symptoms in 2015 per chart review. O/N CIWA Scores were 0-2 with no ativan given.  - Continue CIWA with Ativan  HTN:  BP at baseline of 734L systolic. - BP medications held at this time.   Intellectual Disability:  Per prior notes, he is illiterate, does live alone in a cabin, and functions. - Needs simple directions - Has trouble taking antibiotics at home due to GI Upset - Keep Family Updated  Prior to Admission Living Arrangement:Home Anticipated Discharge Location: Home Barriers to Discharge: Home Dispo: Anticipated discharge in approximately 2-3 day(s).   Maudie Mercury, MD 06/21/2019, 6:15 AM Pager: [phone] After 5pm on weekdays and 1pm on weekends: On Call pager  [phone] 

## 2019-06-21 NOTE — Plan of Care (Signed)
  Problem: Education: Goal: Knowledge of General Education information will improve Description: Including pain rating scale, medication(s)/side effects and non-pharmacologic comfort measures 06/21/2019 2142 by Hart Carwin, RN Outcome: Progressing 06/21/2019 2142 by Hart Carwin, RN Outcome: Progressing   Problem: Health Behavior/Discharge Planning: Goal: Ability to manage health-related needs will improve 06/21/2019 2142 by Hart Carwin, RN Outcome: Progressing 06/21/2019 2142 by Hart Carwin, RN Outcome: Progressing   Problem: Clinical Measurements: Goal: Ability to maintain clinical measurements within normal limits will improve Outcome: Progressing Goal: Will remain free from infection Outcome: Progressing Goal: Diagnostic test results will improve Outcome: Progressing Goal: Respiratory complications will improve Outcome: Progressing Goal: Cardiovascular complication will be avoided Outcome: Progressing

## 2019-06-22 LAB — BASIC METABOLIC PANEL
Anion gap: 10 (ref 5–15)
BUN: 15 mg/dL (ref 6–20)
CO2: 26 mmol/L (ref 22–32)
Calcium: 9.3 mg/dL (ref 8.9–10.3)
Chloride: 104 mmol/L (ref 98–111)
Creatinine, Ser: 0.7 mg/dL (ref 0.61–1.24)
GFR calc Af Amer: >60 mL/min (ref >60)
GFR calc non Af Amer: >60 mL/min (ref >60)
Glucose, Bld: 122 mg/dL — ABNORMAL HIGH (ref 70–99)
Potassium: 3.9 mmol/L (ref 3.5–5.1)
Sodium: 140 mmol/L (ref 135–145)

## 2019-06-22 LAB — URINE CULTURE: Culture: 4000 — AB

## 2019-06-22 LAB — HEMOGLOBIN A1C
Hgb A1c MFr Bld: 6.2 % — ABNORMAL HIGH (ref 4.8–5.6)
Mean Plasma Glucose: 131 mg/dL

## 2019-06-22 LAB — CBC
HCT: 40.8 % (ref 39.0–52.0)
Hemoglobin: 13.5 g/dL (ref 13.0–17.0)
MCH: 33.2 pg (ref 26.0–34.0)
MCHC: 33.1 g/dL (ref 30.0–36.0)
MCV: 100.2 fL — ABNORMAL HIGH (ref 80.0–100.0)
Platelets: 222 10*3/uL (ref 150–400)
RBC: 4.07 MIL/uL — ABNORMAL LOW (ref 4.22–5.81)
RDW: 12.8 % (ref 11.5–15.5)
WBC: 13.6 10*3/uL — ABNORMAL HIGH (ref 4.0–10.5)
nRBC: 0 % (ref 0.0–0.2)

## 2019-06-22 MED ORDER — AMOXICILLIN-POT CLAVULANATE 875-125 MG PO TABS: 1.0000 | ORAL_TABLET | Freq: Two times a day (BID) | ORAL | 0 refills | Status: AC

## 2019-06-22 MED ORDER — PREDNISONE 20 MG PO TABS: 40.0000 mg | ORAL_TABLET | Freq: Every day | ORAL | 0 refills | Status: DC

## 2019-06-22 MED FILL — predniSONE 20 MG TABS: 20 | 15 days supply | Qty: 30 | Fill #0

## 2019-06-22 MED FILL — AMOX-CLAV 875-125 MG TABLET: 875-125 | 4 days supply | Qty: 8 | Fill #0

## 2019-06-22 NOTE — Progress Notes (Signed)
Subjective:  O/N Events: None  James [name] was evaluated at bedside this morning. He states that he is doing well with no complaints of dyspnea, SHOB, need for supplemental O2, fevers, mylagia, chest pain, or abdominal pain. Discussed treatment plan with patient to continue prednisone taper, follow up with with his pulmonologist, and complete a course of Augmentin. Patient agreeable to plan. All questions and concerns were addressed.   Objective:  Vital signs in last 24 hours: Vitals:   06/21/19 1612 06/21/19 2003 06/21/19 2030 06/21/19 2242  BP: (!) 156/96 (!) 163/90  (!) 164/94  Pulse:  88  94  Resp:  18  15  Temp: 98 F (36.7 C) 98.1 F (36.7 C)  98.2 F (36.8 C)  TempSrc: Oral Oral  Oral  SpO2:  95% 94% 97%  Weight:      Height:       Physical Exam Constitutional:      Appearance: Normal appearance.  Cardiovascular:     Rate and Rhythm: Normal rate and regular rhythm.     Pulses: Normal pulses.     Heart sounds: Normal heart sounds. No murmur heard.   Pulmonary:     Effort: Pulmonary effort is normal.     Comments: Crackles appreciate in the RLL. No wheezing appreciated in the L lung fields.  Abdominal:     General: Abdomen is flat. Bowel sounds are normal.     Palpations: Abdomen is soft.     Tenderness: There is no abdominal tenderness.  Musculoskeletal:     Right lower leg: No edema.     Left lower leg: No edema.  Neurological:     Mental Status: He is alert and oriented to person, place, and time.       CBC Latest Ref Rng & Units 06/22/2019 06/21/2019 06/20/2019  WBC 4.0 - 10.5 K/uL 13.6(H) 19.0(H) -  Hemoglobin 13.0 - 17.0 g/dL 13.5 13.2 17.0  Hematocrit 39 - 52 % 40.8 40.4 50.0  Platelets 150 - 400 K/uL 222 213 -   BMP Latest Ref Rng & Units 06/22/2019 06/21/2019 06/20/2019  Glucose 70 - 99 mg/dL 122(H) 111(H) -  BUN 6 - 20 mg/dL 15 15 -  Creatinine 0.61 - 1.24 mg/dL 0.70 0.68 -  BUN/Creat Ratio 9 - 20 - - -  Sodium 135 - 145 mmol/L 140 140 142    Potassium 3.5 - 5.1 mmol/L 3.9 4.2 4.2  Chloride 98 - 111 mmol/L 104 106 -  CO2 22 - 32 mmol/L 26 26 -  Calcium 8.9 - 10.3 mg/dL 9.3 9.0 -    Assessment/Plan:  Active Problems:   COPD exacerbation (HCC)  COPD Exacerbation with RLL Pneumonia: Leukocytosis resolving, patient on room air, with dissipation of his wheezing.  - Transition off azithromycin and ceftriaxone, require Augmentin course to complete a total of 7 days of antibiotics.  - Prednisone 50mg  QD (Will require extended taper as is on 20mg  prednisone at home) - Duonebs Q4H PRN - Albuterol nebs Q2H PRN - Continue home Dulera and Spiriva   Alcohol Use:  O/N CIWA Scores were 0-1 with no ativan given.  - Continue CIWA with Ativan  HTN:  BP at baseline of 627O systolic. - BP medications held at this time.   Intellectual Disability:  Per prior notes, he is illiterate, does live alone in a cabin, and functions. - Needs simple directions - Has trouble taking antibiotics at home due to GI Upset - Keep Family Updated  Prior to Admission Living  Arrangement:Home Anticipated Discharge Location: Home Barriers to Discharge: Home Dispo: Anticipated discharge in approximately today.   Maudie Mercury, MD  06/22/2019, 6:33 AM Pager: [phone] After 5pm on weekdays and 1pm on weekends: On Call pager [phone]

## 2019-06-22 NOTE — Discharge Instructions (Signed)
To James [name],  It was a pleasure working with you during your time at [facility]. During your stay you were diagnosed with with pneumonia and a COPD exacerbation. You were treated with breathing treatments and prednisone with antibiotics. You will be discharged with prednisone and antibiotics. Please take your antibiotic (Augmentin) as indicated on the label. Please follow up with your pulmonologist in one week's time to taper your prednisone. Please come back for reevaluation if you experience shortness of breath, fevers, vomiting, or changes in your mentation.    COPD and Physical Activity Chronic obstructive pulmonary disease (COPD) is a long-term (chronic) condition that affects the lungs. COPD is a general term that can be used to describe many different lung problems that cause lung swelling (inflammation) and limit airflow, including chronic bronchitis and emphysema. The main symptom of COPD is shortness of breath, which makes it harder to do even simple tasks. This can also make it harder to exercise and be active. Talk with your health care provider about treatments to help you breathe better and actions you can take to prevent breathing problems during physical activity. What are the benefits of exercising with COPD? Exercising regularly is an important part of a healthy lifestyle. You can still exercise and do physical activities even though you have COPD. Exercise and physical activity improve your shortness of breath by increasing blood flow (circulation). This causes your heart to pump more oxygen through your body. Moderate exercise can improve your:  Oxygen use.  Energy level.  Shortness of breath.  Strength in your breathing muscles.  Heart health.  Sleep.  Self-esteem and feelings of self-worth.  Depression, stress, and anxiety levels. Exercise can benefit everyone with COPD. The severity of your disease may affect how hard you can exercise, especially at first, but  everyone can benefit. Talk with your health care provider about how much exercise is safe for you, and which activities and exercises are safe for you. What actions can I take to prevent breathing problems during physical activity?  Sign up for a pulmonary rehabilitation program. This type of program may include: ? Education about lung diseases. ? Exercise classes that teach you how to exercise and be more active while improving your breathing. This usually involves:  Exercise using your lower extremities, such as a stationary bicycle.  About 30 minutes of exercise, 2 to 5 times per week, for 6 to 12 weeks  Strength training, such as push ups or leg lifts. ? Nutrition education. ? Group classes in which you can talk with others who also have COPD and learn ways to manage stress.  If you use an oxygen tank, you should use it while you exercise. Work with your health care provider to adjust your oxygen for your physical activity. Your resting flow rate is different from your flow rate during physical activity.  While you are exercising: ? Take slow breaths. ? Pace yourself and do not try to go too fast. ? Purse your lips while breathing out. Pursing your lips is similar to a kissing or whistling position. ? If doing exercise that uses a quick burst of effort, such as weight lifting:  Breathe in before starting the exercise.  Breathe out during the hardest part of the exercise (such as raising the weights). Where to find support You can find support for exercising with COPD from:  Your health care provider.  A pulmonary rehabilitation program.  Your local health department or community health programs.  Support groups, online  or in-person. Your health care provider may be able to recommend support groups. Where to find more information You can find more information about exercising with COPD from:  American Lung Association: ClassInsider.se.  COPD Foundation:  https://www.rivera.net/. Contact a health care provider if:  Your symptoms get worse.  You have chest pain.  You have nausea.  You have a fever.  You have trouble talking or catching your breath.  You want to start a new exercise program or a new activity. Summary  COPD is a general term that can be used to describe many different lung problems that cause lung swelling (inflammation) and limit airflow. This includes chronic bronchitis and emphysema.  Exercise and physical activity improve your shortness of breath by increasing blood flow (circulation). This causes your heart to provide more oxygen to your body.  Contact your health care provider before starting any exercise program or new activity. Ask your health care provider what exercises and activities are safe for you. This information is not intended to replace advice given to you by your health care provider. Make sure you discuss any questions you have with your health care provider. Document Revised: 04/12/2018 Document Reviewed: 01/13/2017 Elsevier Patient Education  2020 [place].   Chronic Obstructive Pulmonary Disease  Chronic obstructive pulmonary disease (COPD) is a long-term (chronic) condition that affects the lungs. COPD is a general term that can be used to describe many different lung problems that cause lung swelling (inflammation) and limit airflow, including chronic bronchitis and emphysema. If you have COPD, your lung function will probably never return to normal. In most cases, it gets worse over time. However, there are steps you can take to slow the progression of the disease and improve your quality of life. What are the causes? This condition may be caused by:  Smoking. This is the most common cause.  Certain genes passed down through families. What increases the risk? The following factors may make you more likely to develop this condition:  Secondhand smoke from cigarettes, pipes, or  cigars.  Exposure to chemicals and other irritants such as fumes and dust in the work environment.  Chronic lung conditions or infections. What are the signs or symptoms? Symptoms of this condition include:  Shortness of breath, especially during physical activity.  Chronic cough with a large amount of thick mucus. Sometimes the cough may not have any mucus (dry cough).  Wheezing.  Rapid breaths.  Gray or bluish discoloration (cyanosis) of the skin, especially in your fingers, toes, or lips.  Feeling tired (fatigue).  Weight loss.  Chest tightness.  Frequent infections.  Episodes when breathing symptoms become much worse (exacerbations).  Swelling in the ankles, feet, or legs. This may occur in later stages of the disease. How is this diagnosed? This condition is diagnosed based on:  Your medical history.  A physical exam. You may also have tests, including:  Lung (pulmonary) function tests. This may include a spirometry test, which measures your ability to exhale properly.  Chest X-ray.  CT scan.  Blood tests. How is this treated? This condition may be treated with:  Medicines. These may include inhaled rescue medicines to treat acute exacerbations as well as long-term, or maintenance, medicines to prevent flare-ups of COPD. ? Bronchodilators help treat COPD by dilating the airways to allow increased airflow and make your breathing more comfortable. ? Steroids can reduce airway inflammation and help prevent exacerbations.  Smoking cessation. If you smoke, your health care provider may ask you to  quit, and may also recommend therapy or replacement products to help you quit.  Pulmonary rehabilitation. This may involve working with a team of health care providers and specialists, such as respiratory, occupational, and physical therapists.  Exercise and physical activity. These are beneficial for nearly all people with COPD.  Nutrition therapy to gain weight, if  you are underweight.  Oxygen. Supplemental oxygen therapy is only helpful if you have a low oxygen level in your blood (hypoxemia).  Lung surgery or transplant.  Palliative care. This is to help people with COPD feel comfortable when treatment is no longer working. Follow these instructions at home: Medicines  Take over-the-counter and prescription medicines (inhaled or pills) only as told by your health care provider.  Talk to your health care provider before taking any cough or allergy medicines. You may need to avoid certain medicines that dry out your airways. Lifestyle  If you are a smoker, the most important thing that you can do is to stop smoking. Do not use any products that contain nicotine or tobacco, such as cigarettes and e-cigarettes. If you need help quitting, ask your health care provider. Continuing to smoke will cause the disease to progress faster.  Avoid exposure to things that irritate your lungs, such as smoke, chemicals, and fumes.  Stay active, but balance activity with periods of rest. Exercise and physical activity will help you maintain your ability to do things you want to do.  Learn and use relaxation techniques to manage stress and to control your breathing.  Get the right amount of sleep and get quality sleep. Most adults need 7 or more hours per night.  Eat healthy foods. Eating smaller, more frequent meals and resting before meals may help you maintain your strength. Controlled breathing Learn and use controlled breathing techniques as directed by your health care provider. Controlled breathing techniques include:  Pursed lip breathing. Start by breathing in (inhaling) through your nose for 1 second. Then, purse your lips as if you were going to whistle and breathe out (exhale) through the pursed lips for 2 seconds.  Diaphragmatic breathing. Start by putting one hand on your abdomen just above your waist. Inhale slowly through your nose. The hand on  your abdomen should move out. Then purse your lips and exhale slowly. You should be able to feel the hand on your abdomen moving in as you exhale. Controlled coughing Learn and use controlled coughing to clear mucus from your lungs. Controlled coughing is a series of short, progressive coughs. The steps of controlled coughing are: 1. Lean your head slightly forward. 2. Breathe in deeply using diaphragmatic breathing. 3. Try to hold your breath for 3 seconds. 4. Keep your mouth slightly open while coughing twice. 5. Spit any mucus out into a tissue. 6. Rest and repeat the steps once or twice as needed. General instructions  Make sure you receive all the vaccines that your health care provider recommends, especially the pneumococcal and influenza vaccines. Preventing infection and hospitalization is very important when you have COPD.  Use oxygen therapy and pulmonary rehabilitation if directed to by your health care provider. If you require home oxygen therapy, ask your health care provider whether you should purchase a pulse oximeter to measure your oxygen level at home.  Work with your health care provider to develop a COPD action plan. This will help you know what steps to take if your condition gets worse.  Keep other chronic health conditions under control as told by your health  care provider.  Avoid extreme temperature and humidity changes.  Avoid contact with people who have an illness that spreads from person to person (is contagious), such as viral infections or pneumonia.  Keep all follow-up visits as told by your health care provider. This is important. Contact a health care provider if:  You are coughing up more mucus than usual.  There is a change in the color or thickness of your mucus.  Your breathing is more labored than usual.  Your breathing is faster than usual.  You have difficulty sleeping.  You need to use your rescue medicines or inhalers more often than  expected.  You have trouble doing routine activities such as getting dressed or walking around the house. Get help right away if:  You have shortness of breath while you are resting.  You have shortness of breath that prevents you from: ? Being able to talk. ? Performing your usual physical activities.  You have chest pain lasting longer than 5 minutes.  Your skin color is more blue (cyanotic) than usual.  You measure low oxygen saturations for longer than 5 minutes with a pulse oximeter.  You have a fever.  You feel too tired to breathe normally. Summary  Chronic obstructive pulmonary disease (COPD) is a long-term (chronic) condition that affects the lungs.  Your lung function will probably never return to normal. In most cases, it gets worse over time. However, there are steps you can take to slow the progression of the disease and improve your quality of life.  Treatment for COPD may include taking medicines, quitting smoking, pulmonary rehabilitation, and changes to diet and exercise. As the disease progresses, you may need oxygen therapy, a lung transplant, or palliative care.  To help manage your condition, do not smoke, avoid exposure to things that irritate your lungs, stay up to date on all vaccines, and follow your health care provider's instructions for taking medicines. This information is not intended to replace advice given to you by your health care provider. Make sure you discuss any questions you have with your health care provider. Document Revised: 12/03/2016 Document Reviewed: 01/26/2016 Elsevier Patient Education  2020 [place].   Chronic Obstructive Pulmonary Disease Exacerbation  Chronic obstructive pulmonary disease (COPD) is a long-term (chronic) condition that affects the lungs. COPD is a general term that can be used to describe many different lung problems that cause lung swelling (inflammation) and limit airflow, including chronic bronchitis and  emphysema. COPD exacerbations are episodes when breathing symptoms become much worse and require extra treatment. COPD exacerbations are usually caused by infections. Without treatment, COPD exacerbations can be severe and even life threatening. Frequent COPD exacerbations can cause further damage to the lungs. What are the causes? This condition may be caused by:  Respiratory infections, including viral and bacterial infections.  Exposure to smoke.  Exposure to air pollution, chemical fumes, or dust.  Things that give you an allergic reaction (allergens).  Not taking your usual COPD medicines as directed.  Underlying medical problems, such as congestive heart failure or infections not involving the lungs. In many cases, the cause (trigger) of this condition is not known. What increases the risk? The following factors may make you more likely to develop this condition:  Smoking cigarettes.  Old age.  Frequent prior COPD exacerbations. What are the signs or symptoms? Symptoms of this condition include:  Increased coughing.  Increased production of mucus from your lungs (sputum).  Increased wheezing.  Increased shortness of breath.  Rapid or labored breathing.  Chest tightness.  Less energy than usual.  Sleep disruption from symptoms.  Confusion or increased sleepiness. Often these symptoms happen or get worse even with the use of medicines. How is this diagnosed? This condition is diagnosed based on:  Your medical history.  A physical exam. You may also have tests, including:  A chest X-ray.  Blood tests.  Lung (pulmonary) function tests. How is this treated? Treatment for this condition depends on the severity and cause of the symptoms. You may need to be admitted to a hospital for treatment. Some of the treatments commonly used to treat COPD exacerbations are:  Antibiotic medicines. These may be used for severe exacerbations caused by a lung infection,  such as pneumonia.  Bronchodilators. These are inhaled medicines that expand the air passages and allow increased airflow.  Steroid medicines. These act to reduce inflammation in the airways. They may be given with an inhaler, taken by mouth, or given through an IV tube inserted into one of your veins.  Supplemental oxygen therapy.  Airway clearing techniques, such as noninvasive ventilation (NIV) and positive expiratory pressure (PEP). These provide respiratory support through a mask or other noninvasive device. An example of this would be using a continuous positive airway pressure (CPAP) machine to improve delivery of oxygen into your lungs. Follow these instructions at home: Medicines  Take over-the-counter and prescription medicines only as told by your health care provider. It is important to use correct technique with inhaled medicines.  If you were prescribed an antibiotic medicine or oral steroid, take it as told by your health care provider. Do not stop taking the medicine even if you start to feel better. Lifestyle  Eat a healthy diet.  Exercise regularly.  Get plenty of sleep.  Avoid exposure to all substances that irritate the airway, especially to tobacco smoke.  Wash your hands often with soap and water to reduce the risk of infection. If soap and water are not available, use hand sanitizer.  During flu season, avoid enclosed spaces that are crowded with people. General instructions  Drink enough fluid to keep your urine clear or pale yellow (unless you have a medical condition that requires fluid restriction).  Use a cool mist vaporizer. This humidifies the air and makes it easier for you to clear your chest when you cough.  If you have a home nebulizer and oxygen, continue to use them as told by your health care provider.  Keep all follow-up visits as told by your health care provider. This is important. How is this prevented?  Stay up-to-date on pneumococcal  and influenza (flu) vaccines. A flu shot is recommended every year to help prevent exacerbations.  Do not use any products that contain nicotine or tobacco, such as cigarettes and e-cigarettes. Quitting smoking is very important in preventing COPD from getting worse and in preventing exacerbations from happening as often. If you need help quitting, ask your health care provider.  Follow all instructions for pulmonary rehabilitation after a recent exacerbation. This can help prevent future exacerbations.  Work with your health care provider to develop and follow an action plan. This tells you what steps to take when you experience certain symptoms. Contact a health care provider if:  You have a worsening of your regular COPD symptoms. Get help right away if:  You have worsening shortness of breath, even when resting.  You have trouble talking.  You have severe chest pain.  You cough up blood.  You  have a fever.  You have weakness, vomit repeatedly, or faint.  You feel confused.  You are not able to sleep because of your symptoms.  You have trouble doing daily activities. Summary  COPD exacerbations are episodes when breathing symptoms become much worse and require extra treatment above your normal treatment.  Exacerbations can be severe and even life threatening. Frequent COPD exacerbations can cause further damage to your lungs.  COPD exacerbations are usually triggered by infections such as the flu, colds, and even pneumonia.  Treatment for this condition depends on the severity and cause of the symptoms. You may need to be admitted to a hospital for treatment.  Quitting smoking is very important to prevent COPD from getting worse and to prevent exacerbations from happening as often. This information is not intended to replace advice given to you by your health care provider. Make sure you discuss any questions you have with your health care provider. Document Revised:  12/03/2016 Document Reviewed: 01/26/2016 Elsevier Patient Education  2020 [employer].

## 2019-06-22 NOTE — Discharge Summary (Signed)
Name: James [name] MRN: [id number] DOB: 11/07/1960 59 y.o. PCP: [name], MD  Date of Admission: 06/20/2019  6:00 AM Date of Discharge: 06/22/2019 Attending Physician: Aldine Contes, MD  Discharge Diagnosis: 1. COPD Exacerbation Secondary to Right Lower Lobe Pneumonia 2. Alcohol Use 3. Hypertension   Discharge Medications: Allergies as of 06/22/2019      Reactions   Gabapentin Other (See Comments)   hallucinations      Medication List    STOP taking these medications   hydrOXYzine 25 MG tablet Commonly known as: ATARAX/VISTARIL   triamcinolone cream 0.1 % Commonly known as: KENALOG     TAKE these medications   AeroChamber MV inhaler Use as instructed   albuterol 108 (90 Base) MCG/ACT inhaler Commonly known as: VENTOLIN HFA Inhale 2 puffs into the lungs every 6 (six) hours as needed for wheezing or shortness of breath.   albuterol (2.5 MG/3ML) 0.083% nebulizer solution Commonly known as: PROVENTIL Take 3 mLs (2.5 mg total) by nebulization every 6 (six) hours as needed for wheezing or shortness of breath.   amoxicillin-clavulanate 875-125 MG tablet Commonly known as: Augmentin Take 1 tablet by mouth 2 (two) times daily for 4 days.   Flutter Devi Use 4 times daily   folic acid 1 MG tablet Commonly known as: FOLVITE Take 1 tablet (1 mg total) by mouth daily.   losartan 100 MG tablet Commonly known as: COZAAR TAKE 1 TABLET (100 MG TOTAL) BY MOUTH DAILY.   mometasone-formoterol 200-5 MCG/ACT Aero Commonly known as: DULERA Inhale 2 puffs into the lungs 2 (two) times daily.   predniSONE 20 MG tablet Commonly known as: Deltasone Take 2 tablets (40 mg total) by mouth daily for 15 days. What changed:   medication strength  how much to take  how to take this  when to take this  additional instructions   Spiriva HandiHaler 18 MCG inhalation capsule Generic drug: tiotropium Place 1 capsule (18 mcg total) into inhaler and inhale daily.     thiamine 100 MG tablet Commonly known as: Vitamin B-1 Take 1 tablet (100 mg total) by mouth daily.       Disposition and follow-up:   [name] was discharged from [facility] in Stable condition.  At the hospital follow up visit please address:  1. COPD Exacerbation Secondary to Right Lower Lobe Pneumonia  - Patient on extended taper before admission and on Prednisone 20 mg at home. Discharged patient with 40 mg Prednisone, please continue to slowly taper.   2. Alcohol Use  -Continue counseling on appropriate alcohol use.  3. Hypertension   - Continue monitoring in the outpatient setting.   2.  Labs / imaging needed at time of follow-up:  Chest xray in 4-6 weeks, if infiltrate persists consider bronchoscopy or CT to rule out obstructive etiology.   3.  Pending labs/ test needing follow-up: None   Follow-up Appointments:  Follow-up Information    [name], MD. Schedule an appointment as soon as possible for a visit in 1 week(s).   Specialty: Pulmonary Disease Why: Please make an appointment in one weeks time for follow up on your COPD Exaerbation and pneumonia.  Contact information: [address] [place] [address] [place], Cassie Freer, MD .   Specialties: Internal Medicine, Cardiology, Radiology Contact information: [address] [place] [address] [phone]  Hospital Course by problem list: 1. COPD Exacerbation Secondary to Right Lower Lobe Pneumonia Patient presented with dyspnea and fevers. During his emergency department course, he was found to be tachycardic and febrile requiring BiPAP to maintain appropriate oxygen saturations. His lab work showed a leukocytosis of 17 with a pH of 7.36 and pCO2 of 38.6 with lactic acidosis. He was managed with prednisone, duonebs, albuterol, home Dulera and Spiriva with Ceftriaxone and Azithromycin. He was weaned down to room air and  switched Augmentin. He was discharged with 40 mg of Prednisone and Augmentin to complete his 7 day course of antibiotics. Patient was instructed to follow up with his pulmonologist closely in the outpatient setting to continue his taper.   2. Alcohol Use Patient endorsed daily alcohol use and has 6 beers daily. He was monitored on CIWA protocol with ativan. Patient's CIWA scores were 0-2 during his admission and did not require ativan. He was discharged in stable condition.   Discharge Vitals:   BP (!) 149/83   Pulse 77   Temp 98 F (36.7 C) (Oral)   Resp 15   Ht 5\' 9"  (1.753 m)   Wt 85.7 kg   SpO2 91%   BMI 27.91 kg/m   Pertinent Labs, Studies, and Procedures:  CBC Latest Ref Rng & Units 06/22/2019 06/21/2019 06/20/2019  WBC 4.0 - 10.5 K/uL 13.6(H) 19.0(H) -  Hemoglobin 13.0 - 17.0 g/dL 13.5 13.2 17.0  Hematocrit 39 - 52 % 40.8 40.4 50.0  Platelets 150 - 400 K/uL 222 213 -   BMP Latest Ref Rng & Units 06/22/2019 06/21/2019 06/20/2019  Glucose 70 - 99 mg/dL 122(H) 111(H) -  BUN 6 - 20 mg/dL 15 15 -  Creatinine 0.61 - 1.24 mg/dL 0.70 0.68 -  BUN/Creat Ratio 9 - 20 - - -  Sodium 135 - 145 mmol/L 140 140 142  Potassium 3.5 - 5.1 mmol/L 3.9 4.2 4.2  Chloride 98 - 111 mmol/L 104 106 -  CO2 22 - 32 mmol/L 26 26 -  Calcium 8.9 - 10.3 mg/dL 9.3 9.0 -   EXAM: PORTABLE CHEST 1 VIEW  COMPARISON:  None.  FINDINGS: The heart size and mediastinal contours are within normal limits. Interval worsening in the patchy hazy airspace opacity at the right lung base. The left lung is clear. No pleural effusion is present. Again noted are bilateral healed posterior rib fractures.  IMPRESSION: Interval worsening in the patchy airspace opacity at the right lower lung, likely due to pneumonia. Would recommend short interval follow-up upon resolution of symptoms and treatment to determine resolution.  Discharge Instructions: Discharge Instructions    Call MD for:  difficulty breathing,  headache or visual disturbances   Complete by: As directed    Diet - low sodium heart healthy   Complete by: As directed    Increase activity slowly   Complete by: As directed       Signed: Maudie Mercury, MD 06/22/2019, 1:41 PM   Pager: [phone]

## 2019-06-25 LAB — CULTURE, BLOOD (ROUTINE X 2)
Culture: NO GROWTH
Culture: NO GROWTH
Special Requests: ADEQUATE

## 2019-06-26 ENCOUNTER — Telehealth: Payer: Self-pay

## 2019-06-26 NOTE — Telephone Encounter (Signed)
Transition Care Management Follow-up Telephone Call  Call completed with patient's sister, [name].   DPR on file to speak with her. The patient was not with her at the time of the call.    Date of discharge and from where: 06/22/2019, [facility]  How have you been since you were released from the hospital? [name] said that he is doing pretty good. Her only concern is that he has not been asking her to refill inhalers for him and she wants to make sure he has what he is supposed to have.   Any questions or concerns? noted above regarding inhalers - will ask Joellen Jersey Lynch,EMT to check the status of the inhalers in patient's home.   The patient has been approved for disability.   Items Reviewed:  Did the pt receive and understand the discharge instructions provided?  yes, his sister has them   Medications obtained and verified? [name] said that he has all medications except she is not sure of the status of the inhalers as already noted. She is also aware of the change with the prednisone and said that he is taking 40 mg daily x 15 days.  He received the augmentin when he was discharged from the hospital.  [name] said that she has been trying to give him more independence with managing his medications but she thinks she may need to return to providing more oversight.   Any new allergies since your discharge?  none reported   Do you have support at home? he lives alone. [name] lives about a mile away.  She said that he calls her a couple of times a day to check in.  She also noted  that his other sister is planning [place] in the beginning of July.     Other (ie: DME, Home Health, etc) no home health or DME ordered.   Has aerochamber and nebulizer   Functional Questionnaire: (I = Independent and D = Dependent) ADL's:independent.  His sister, [name], assists with medication management.    Follow up appointments reviewed:    PCP Hospital f/u  appt confirmed?[name] will take the patient to the [facility] clinic on 07/02/2019 around 1600 for a hospital follow up appointment.  The patient knows how to get to [facility] but is not able to read and would be confused going on his own to the mobile unit, so she will accompany him,   [facility] f/u appt confirmed?[name] will call to schedule the cardiology appointment. She has the phone number of the AVS  Are transportation arrangements needed? no, the patient drives and [name] can provide transportation when needed.   If their condition worsens, is the pt aware to call  their PCP or go to the ED? yes  Was the patient provided with contact information for the PCP's office or ED?  yes  Was the pt encouraged to call back with questions or concerns?  yes

## 2019-06-27 NOTE — Telephone Encounter (Signed)
Call placed to patient's sister, [name], at the request of [name], EMT.  She confirmed that a cardiology appointment has been scheduled but she is not sure why he is seeing the cardiologist other than for a consultation for his HTN.   She also clarified that he is taking prednisone 40 mg daily.  The provider will adjust/taper as needed.   She explained that she is planning to have him stay with her in [place].  They have been apart for about 16 years and she is anxious to re-connect with him.  She is his POA and hopes to be able to assist him with obtaining an ID card.

## 2019-07-01 NOTE — Progress Notes (Signed)
Cardiology Office Note:   Date:  07/03/2019  NAME:  James [name]    MRN: [id number] DOB:  January 28, 1960   PCP:  [name], MD  Cardiologist:  Evalina Field, MD   Referring MD: [name], MD   Chief Complaint  Patient presents with  . Shortness of Breath   History of Present Illness:   James [name] is a 59 y.o. male with a hx of COPD, hypertension who is being seen today for the evaluation of shortness of breath at the request of [name], MD.  Recent admission to the hospital 06/20/2019-06/22/2019 for COPD exacerbation secondary to pneumonia.  He was referred to cardiology for COPD.  He had several admissions for COPD.  His chest x-rays have been inconsistent with pulmonary edema.  BNP was normal. He reports he gets profoundly short of breath with any activity.  He reports he is mainly doing activities around the house.  He reports he may get some intermittent episodes of sharp transient chest pain that lasts seconds.  This is associated with breathing.  His exams are diffusely rhonchorous on examination.  He also has scattered wheezing.  He is using inhalers.  He has had several admissions recently for COPD.  His blood pressure is well controlled today.  His EKG today demonstrates sinus tachycardia without any acute ischemic change or evidence of prior infarction.  Main issue appears to be COPD.  Past Medical History: Past Medical History:  Diagnosis Date  . Acute on chronic respiratory failure with hypoxia ([place]) 06/08/2013  . CAP (community acquired pneumonia) 05/17/2018   See admit 05/17/18 ? rml  with   covid pcr neg - rx augmentin > f/u cxr in 4-6 weeks is fine unless condition declines   . COPD (chronic obstructive pulmonary disease) ([place])    not on home  . Dyspnea   . Hypertension   . Pneumonia 04/06/2016  . Pneumothorax    2016, fell from ladder  . 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 Tick bite 06/03/2018    Past Surgical History: Past Surgical  History:  Procedure Laterality Date  . VIDEO ASSISTED THORACOSCOPY (VATS)/THOROCOTOMY Left 06/11/2013   Procedure: VIDEO ASSISTED THORACOSCOPY (VATS)/THOROCOTOMY;  Surgeon: James Poot, MD;  Location: [place];  Service: Thoracic;  Laterality: Left;  [name] VIDEO BRONCHOSCOPY N/A 06/11/2013   Procedure: VIDEO BRONCHOSCOPY;  Surgeon: James Poot, MD;  Location: [place];  Service: Thoracic;  Laterality: N/A;  . VIDEO BRONCHOSCOPY N/A 08/10/2018   Procedure: VIDEO BRONCHOSCOPY WITH FLUORO;  Surgeon: James Furbish, MD;  Location: [facility] ENDOSCOPY;  Service: Endoscopy;  Laterality: N/A;    Current Medications: Current Meds  Medication Sig  . albuterol (PROVENTIL) (2.5 MG/3ML) 0.083% nebulizer solution Take 3 mLs (2.5 mg total) by nebulization every 6 (six) hours as needed for wheezing or shortness of breath.  [name] albuterol (VENTOLIN HFA) 108 (90 Base) MCG/ACT inhaler Inhale 2 puffs into the lungs every 6 (six) hours as needed for wheezing or shortness of breath.  . folic acid (FOLVITE) 1 MG tablet Take 1 tablet (1 mg total) by mouth daily.  [name] losartan (COZAAR) 100 MG tablet Take 1 tablet (100 mg total) by mouth daily.  . mometasone-formoterol (DULERA) 200-5 MCG/ACT AERO Inhale 2 puffs into the lungs 2 (two) times daily.  . predniSONE (DELTASONE) 20 MG tablet Take 2 tablets (40 mg total) by mouth daily for 15 days.  [name] Respiratory Therapy Supplies (FLUTTER) DEVI Use 4 times daily  .  Spacer/Aero-Holding Chambers (AEROCHAMBER MV) inhaler Use as instructed  . thiamine (VITAMIN B-1) 100 MG tablet Take 1 tablet (100 mg total) by mouth daily.  [name] tiotropium (SPIRIVA HANDIHALER) 18 MCG inhalation capsule Place 1 capsule (18 mcg total) into inhaler and inhale daily.     Allergies:    Gabapentin   Social History: Social History   Socioeconomic History  . Marital status: Divorced    Spouse name: Not on file  . Number of children: Not on file  . Years of education: Not on file  . Highest education level: Not on  file  Occupational History  . Occupation: Dealer  . Occupation: Painter  Tobacco Use  . Smoking status: Former Smoker    Packs/day: 1.50    Years: 40.00    Pack years: 60.00    Types: Cigarettes    Quit date: 2014    Years since quitting: 7.4  . Smokeless tobacco: Current User    Types: Chew  Vaping Use  . Vaping Use: Never used  Substance and Sexual Activity  . Alcohol use: Yes    Alcohol/week: 28.0 standard drinks    Types: 28 Cans of beer per week    Comment: 4 beers a night; + jitters with not drinking, denies DTs or seizures  . Drug use: Yes    Types: Marijuana    Comment: daily; quit using crack and methamphetamine about 5 months ago   . Sexual activity: Not Currently    Partners: Female  Other Topics Concern  . Not on file  Social History Narrative   Lives alone near [place]   Social Determinants of Health   Financial Resource Strain: High Risk  . Difficulty of Paying Living Expenses: Hard  Food Insecurity: Unknown  . Worried About Charity fundraiser in the Last Year: Not on file  . Ran Out of Food in the Last Year: Never true  Transportation Needs: No Transportation Needs  . Lack of Transportation (Medical): No  . Lack of Transportation (Non-Medical): No  Physical Activity: Inactive  . Days of Exercise per Week: 0 days  . Minutes of Exercise per Session: 0 min  Stress: Stress Concern Present  . Feeling of Stress : To some extent  Social Connections: Moderately Integrated  . Frequency of Communication with Friends and Family: Three times a week  . Frequency of Social Gatherings with Friends and Family: Once a week  . Attends Religious Services: 1 to 4 times per year  . Active Member of Clubs or Organizations: Yes  . Attends Archivist Meetings: 1 to 4 times per year  . Marital Status: Divorced     Family History: The patient's family history includes COPD in his sister; Heart disease in his father.  ROS:   All other ROS reviewed and  negative. Pertinent positives noted in the HPI.     EKGs/Labs/Other Studies Reviewed:   The following studies were personally reviewed by me today:  EKG:  EKG is ordered today.  The ekg ordered today demonstrates normal sinus rhythm, heart rate 100, no acute ST-T changes, no evidence for infarct, and was personally reviewed by me.   Recent Labs: 04/23/2019: B Natriuretic Peptide 58.3 06/20/2019: ALT 29; Magnesium 1.9 06/22/2019: BUN 15; Creatinine, Ser 0.70; Hemoglobin 13.5; Platelets 222; Potassium 3.9; Sodium 140   Recent Lipid Panel    Component Value Date/Time   TRIG 52 05/17/2018 0902    Physical Exam:   VS:  BP 138/64   Pulse 100  Ht 5' 9.5" (1.765 m)   Wt 186 lb (84.4 kg)   SpO2 95%   BMI 27.07 kg/m    Wt Readings from Last 3 Encounters:  07/03/19 186 lb (84.4 kg)  07/02/19 177 lb (80.3 kg)  06/20/19 189 lb (85.7 kg)    General: Well nourished, well developed, in no acute distress Heart: Atraumatic, normal size  Eyes: PEERLA, EOMI  Neck: Supple, no JVD Endocrine: No thryomegaly Cardiac: Normal S1, S2; RRR; no murmurs, rubs, or gallops Lungs: Rhonchi bilaterally with scattered wheezing Abd: Soft, nontender, no hepatomegaly  Ext: No edema, pulses 2+ Musculoskeletal: No deformities, BUE and BLE strength normal and equal Skin: Warm and dry, no rashes   Neuro: Alert and oriented to person, place, time, and situation, CNII-XII grossly intact, no focal deficits  Psych: Normal mood and affect   ASSESSMENT:   JALIN ALICEA is a 59 y.o. male who presents for the following: 1. SOB (shortness of breath) on exertion     PLAN:   1. SOB (shortness of breath) on exertion -He presents for evaluation of shortness of breath.  He has very bad COPD.  His examination is consistent with this.  His shortness of breath I suspect is just COPD related.  He describes infrequent episodes of sharp chest pain at last seconds.  This is atypical.  I see no need for further cardiac evaluation  at this time.  He has no evidence of heart failure on exam and no murmurs rubs or gallops.  This is clearly COPD.  He will see [place] as needed.  Disposition: Return if symptoms worsen or fail to improve.  Medication Adjustments/Labs and Tests Ordered: Current medicines are reviewed at length with the patient today.  Concerns regarding medicines are outlined above.  No orders of the defined types were placed in this encounter.  No orders of the defined types were placed in this encounter.   Patient Instructions  Medication Instructions:  The current medical regimen is effective;  continue present plan and medications.  *If you need a refill on your cardiac medications before your next appointment, please call your pharmacy*   Follow-Up: At [facility], you and your health needs are our priority.  As part of our continuing mission to provide you with exceptional heart care, we have created designated Provider Care Teams.  These Care Teams include your primary Cardiologist (physician) and Advanced Practice Providers (APPs -  Physician Assistants and Nurse Practitioners) who all work together to provide you with the care you need, when you need it.  We recommend signing up for the patient portal called "MyChart".  Sign up information is provided on this After Visit Summary.  MyChart is used to connect with patients for Virtual Visits (Telemedicine).  Patients are able to view lab/test results, encounter notes, upcoming appointments, etc.  Non-urgent messages can be sent to your provider as well.   To learn more about what you can do with MyChart, go to [email or web address].    Your next appointment:   As needed  The format for your next appointment:   In Person  Provider:   Eleonore Chiquito, MD        Signed, Addison Naegeli. Audie Box, [place]  [address], [place], [place] [address] [phone]  07/03/2019 1:51 PM

## 2019-07-02 ENCOUNTER — Other Ambulatory Visit: Payer: Self-pay

## 2019-07-02 ENCOUNTER — Ambulatory Visit: Payer: Medicaid Other | Admitting: Physician Assistant

## 2019-07-02 VITALS — BP 131/81 | HR 71 | Temp 98.4°F | Resp 18 | Ht 69.5 in | Wt 177.0 lb

## 2019-07-02 DIAGNOSIS — J441 Chronic obstructive pulmonary disease with (acute) exacerbation: Secondary | ICD-10-CM | POA: Diagnosis not present

## 2019-07-02 DIAGNOSIS — I10 Essential (primary) hypertension: Secondary | ICD-10-CM

## 2019-07-02 MED ORDER — LOSARTAN POTASSIUM 100 MG PO TABS: 100.0000 mg | ORAL_TABLET | Freq: Every day | ORAL | 2 refills | Status: DC

## 2019-07-02 MED FILL — LOSARTAN POTASSIUM 100 MG T: 100 | 30 days supply | Qty: 30 | Fill #0

## 2019-07-02 NOTE — Patient Instructions (Addendum)
Your appointment tomorrow with cardiology is at 1:20 PM, address is [address].  It is with [facility] at [facility].  Please continue prednisone 20 mg daily, please keep follow-up appointment with [name] on July 14.  Please let us know if there is anything else we can help you with.  James Rad, PA-C Physician Assistant [facility] [email or web address]    COPD and Physical Activity Chronic obstructive pulmonary disease (COPD) is a long-term (chronic) condition that affects the lungs. COPD is a general term that can be used to describe many different lung problems that cause lung swelling (inflammation) and limit airflow, including chronic bronchitis and emphysema. The main symptom of COPD is shortness of breath, which makes it harder to do even simple tasks. This can also make it harder to exercise and be active. Talk with your health care provider about treatments to help you breathe better and actions you can take to prevent breathing problems during physical activity. What are the benefits of exercising with COPD? Exercising regularly is an important part of a healthy lifestyle. You can still exercise and do physical activities even though you have COPD. Exercise and physical activity improve your shortness of breath by increasing blood flow (circulation). This causes your heart to pump more oxygen through your body. Moderate exercise can improve your:  Oxygen use.  Energy level.  Shortness of breath.  Strength in your breathing muscles.  Heart health.  Sleep.  Self-esteem and feelings of self-worth.  Depression, stress, and anxiety levels. Exercise can benefit everyone with COPD. The severity of your disease may affect how hard you can exercise, especially at first, but everyone can benefit. Talk with your health care provider about how much exercise is safe  for you, and which activities and exercises are safe for you. What actions can I take to prevent breathing problems during physical activity?  Sign up for a pulmonary rehabilitation program. This type of program may include: ? Education about lung diseases. ? Exercise classes that teach you how to exercise and be more active while improving your breathing. This usually involves:  Exercise using your lower extremities, such as a stationary bicycle.  About 30 minutes of exercise, 2 to 5 times per week, for 6 to 12 weeks  Strength training, such as push ups or leg lifts. ? Nutrition education. ? Group classes in which you can talk with others who also have COPD and learn ways to manage stress.  If you use an oxygen tank, you should use it while you exercise. Work with your health care provider to adjust your oxygen for your physical activity. Your resting flow rate is different from your flow rate during physical activity.  While you are exercising: ? Take slow breaths. ? Pace yourself and do not try to go too fast. ? Purse your lips while breathing out. Pursing your lips is similar to a kissing or whistling position. ? If doing exercise that uses a quick burst of effort, such as weight lifting:  Breathe in before starting the exercise.  Breathe out during the hardest part of the exercise (such as raising the weights). Where to find support You can find support for exercising with COPD from:  Your health care provider.  A pulmonary rehabilitation program.  Your local health department or community health programs.  Support groups, online or in-person. Your health care provider may be able to recommend support groups. Where  to find more information You can find more information about exercising with COPD from:  American Lung Association: ClassInsider.se.  COPD Foundation: https://www.rivera.net/. Contact a health care provider if:  Your symptoms get worse.  You have chest pain.  You  have nausea.  You have a fever.  You have trouble talking or catching your breath.  You want to start a new exercise program or a new activity. Summary  COPD is a general term that can be used to describe many different lung problems that cause lung swelling (inflammation) and limit airflow. This includes chronic bronchitis and emphysema.  Exercise and physical activity improve your shortness of breath by increasing blood flow (circulation). This causes your heart to provide more oxygen to your body.  Contact your health care provider before starting any exercise program or new activity. Ask your health care provider what exercises and activities are safe for you. This information is not intended to replace advice given to you by your health care provider. Make sure you discuss any questions you have with your health care provider. Document Revised: 04/12/2018 Document Reviewed: 01/13/2017 Elsevier Patient Education  2020 [employer].

## 2019-07-02 NOTE — Progress Notes (Signed)
SOB flared up this morning. Patient was able to complete his work shift but needs a refill on his incruse.

## 2019-07-02 NOTE — Progress Notes (Signed)
Established Patient Office Visit  Subjective:  Patient ID: James [name], male    DOB: 12/03/1960  Age: 59 y.o. MRN: [id number]  CC:  Chief Complaint  Patient presents with  . COPD    HPI James [name] was hospitalized at [facility] from June 16 through June 22, 2019.    Hospital Course by problem list: 1. COPD Exacerbation Secondary to Right Lower Lobe Pneumonia Patient presented with dyspnea and fevers. During his emergency department course, he was found to be tachycardic and febrile requiring BiPAP to maintain appropriate oxygen saturations. His lab work showed a leukocytosis of 17 with a pH of 7.36 and pCO2 of 38.6 with lactic acidosis. He was managed with prednisone, duonebs, albuterol, home Dulera and Spiriva with Ceftriaxone and Azithromycin. He was weaned down to room air and switched Augmentin. He was discharged with 40 mg of Prednisone and Augmentin to complete his 7 day course of antibiotics. Patient was instructed to follow up with his pulmonologist closely in the outpatient setting to continue his taper.   2. Alcohol Use Patient endorsed daily alcohol use and has 6 beers daily. He was monitored on CIWA protocol with ativan. Patient's CIWA scores were 0-2 during his admission and did not require ativan. He was discharged in stable condition.  Disposition and follow-up:   [name] was discharged from [facility] in Stable condition.  At the hospital follow up visit please address:  1. COPD Exacerbation Secondary to Right Lower Lobe Pneumonia             - Patient on extended taper before admission and on Prednisone 20 mg at home. Discharged patient with 40 mg Prednisone, please continue to slowly taper.   2. Alcohol Use             -Continue counseling on appropriate alcohol use.  3. Hypertension              - Continue monitoring in the outpatient setting.   2.  Labs / imaging needed at time of follow-up:  Chest xray in 4-6  weeks, if infiltrate persists consider bronchoscopy or CT to rule out obstructive etiology.   3.  Pending labs/ test needing follow-up: None    Since he has been home, he reports he continues to use his prednisone 20 mg, states that he does feel he has improved, however reports he still has difficulty breathing, attributes it to the heat.  States that when he is indoors it is much better.  Reports he has a cardiology appointment tomorrow, is unsure of reason.  Is looking forward to visiting his sister in [place] next week.  No other concerns at this time  Past Medical History:  Diagnosis Date  . Acute on chronic respiratory failure with hypoxia ([place]) 06/08/2013  . CAP (community acquired pneumonia) 05/17/2018   See admit 05/17/18 ? rml  with   covid pcr neg - rx augmentin > f/u cxr in 4-6 weeks is fine unless condition declines   . COPD (chronic obstructive pulmonary disease) ([place])    not on home  . Dyspnea   . Hypertension   . Pneumonia 04/06/2016  . Pneumothorax    2016, fell from ladder  . 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 Tick bite 06/03/2018    Past Surgical History:  Procedure Laterality Date  . VIDEO ASSISTED THORACOSCOPY (VATS)/THOROCOTOMY Left 06/11/2013   Procedure: VIDEO ASSISTED THORACOSCOPY (VATS)/THOROCOTOMY;  Surgeon: Ivin Poot, MD;  Location: [place];  Service: Thoracic;  Laterality: Left;  James [name] VIDEO BRONCHOSCOPY N/A 06/11/2013   Procedure: VIDEO BRONCHOSCOPY;  Surgeon: Ivin Poot, MD;  Location: [place];  Service: Thoracic;  Laterality: N/A;  . VIDEO BRONCHOSCOPY N/A 08/10/2018   Procedure: VIDEO BRONCHOSCOPY WITH FLUORO;  Surgeon: Candee Furbish, MD;  Location: [facility] ENDOSCOPY;  Service: Endoscopy;  Laterality: N/A;    Family History  Problem Relation Age of Onset  . Heart disease Father   . COPD Sister     Social History   Socioeconomic History  . Marital status: Divorced    Spouse name: Not on file  . Number of children: Not on file  . Years of  education: Not on file  . Highest education level: Not on file  Occupational History  . Occupation: Dealer  . Occupation: Painter  Tobacco Use  . Smoking status: Former Smoker    Packs/day: 1.50    Years: 40.00    Pack years: 60.00    Types: Cigarettes    Quit date: 2014    Years since quitting: 7.4  . Smokeless tobacco: Current User    Types: Chew  Vaping Use  . Vaping Use: Never used  Substance and Sexual Activity  . Alcohol use: Yes    Alcohol/week: 28.0 standard drinks    Types: 28 Cans of beer per week    Comment: 4 beers a night; + jitters with not drinking, denies DTs or seizures  . Drug use: Yes    Types: Marijuana    Comment: daily; quit using crack and methamphetamine about 5 months ago   . Sexual activity: Not Currently    Partners: Female  Other Topics Concern  . Not on file  Social History Narrative   Lives alone near [place]   Social Determinants of Health   Financial Resource Strain: High Risk  . Difficulty of Paying Living Expenses: Hard  Food Insecurity: Unknown  . Worried About Charity fundraiser in the Last Year: Not on file  . Ran Out of Food in the Last Year: Never true  Transportation Needs: No Transportation Needs  . Lack of Transportation (Medical): No  . Lack of Transportation (Non-Medical): No  Physical Activity: Inactive  . Days of Exercise per Week: 0 days  . Minutes of Exercise per Session: 0 min  Stress: Stress Concern Present  . Feeling of Stress : To some extent  Social Connections: Moderately Integrated  . Frequency of Communication with Friends and Family: Three times a week  . Frequency of Social Gatherings with Friends and Family: Once a week  . Attends Religious Services: 1 to 4 times per year  . Active Member of Clubs or Organizations: Yes  . Attends Archivist Meetings: 1 to 4 times per year  . Marital Status: Divorced  Human resources officer Violence:   . Fear of Current or Ex-Partner:   . Emotionally Abused:    James [name] Physically Abused:   . Sexually Abused:     Outpatient Medications Prior to Visit  Medication Sig Dispense Refill  . albuterol (PROVENTIL) (2.5 MG/3ML) 0.083% nebulizer solution Take 3 mLs (2.5 mg total) by nebulization every 6 (six) hours as needed for wheezing or shortness of breath. 150 mL 0  . albuterol (VENTOLIN HFA) 108 (90 Base) MCG/ACT inhaler Inhale 2 puffs into the lungs every 6 (six) hours as needed for wheezing or shortness of breath. 6.7 g 0  . folic acid (FOLVITE) 1 MG tablet Take 1 tablet (1 mg total) by  mouth daily. 90 tablet 0  . mometasone-formoterol (DULERA) 200-5 MCG/ACT AERO Inhale 2 puffs into the lungs 2 (two) times daily. 13 g 0  . predniSONE (DELTASONE) 20 MG tablet Take 2 tablets (40 mg total) by mouth daily for 15 days. 30 tablet 0  . Respiratory Therapy Supplies (FLUTTER) DEVI Use 4 times daily 1 each 0  . Spacer/Aero-Holding Chambers (AEROCHAMBER MV) inhaler Use as instructed 1 each 0  . tiotropium (SPIRIVA HANDIHALER) 18 MCG inhalation capsule Place 1 capsule (18 mcg total) into inhaler and inhale daily. 30 capsule 0  . losartan (COZAAR) 100 MG tablet TAKE 1 TABLET (100 MG TOTAL) BY MOUTH DAILY. 30 tablet 2  . thiamine (VITAMIN B-1) 100 MG tablet Take 1 tablet (100 mg total) by mouth daily. (Patient not taking: Reported on 05/02/2019) 30 tablet 3   No facility-administered medications prior to visit.    Allergies  Allergen Reactions  . Gabapentin Other (See Comments)    hallucinations    ROS Review of Systems  Constitutional: Negative for chills and fever.  HENT: Negative.   Eyes: Negative.   Respiratory: Positive for cough and shortness of breath. Negative for chest tightness and wheezing.   Cardiovascular: Negative.   Gastrointestinal: Negative.   Endocrine: Negative.   Genitourinary: Negative.   Musculoskeletal: Negative.   Skin: Negative.   Allergic/Immunologic: Negative.   Neurological: Negative.   Hematological: Negative.    Psychiatric/Behavioral: Negative.       Objective:    Physical Exam Vitals and nursing note reviewed.  Constitutional:      General: He is not in acute distress.    Appearance: Normal appearance. He is not ill-appearing.  HENT:     Head: Normocephalic and atraumatic.     Right Ear: External ear normal.     Left Ear: External ear normal.     Nose: Nose normal.     Mouth/Throat:     Mouth: Mucous membranes are moist.     Pharynx: Oropharynx is clear.  Eyes:     Extraocular Movements: Extraocular movements intact.     Conjunctiva/sclera: Conjunctivae normal.     Pupils: Pupils are equal, round, and reactive to light.  Cardiovascular:     Rate and Rhythm: Normal rate and regular rhythm.     Pulses: Normal pulses.     Heart sounds: Normal heart sounds.  Pulmonary:     Effort: Pulmonary effort is normal.     Breath sounds: Normal breath sounds. No wheezing or rhonchi.  Abdominal:     General: Abdomen is flat. Bowel sounds are normal.     Palpations: Abdomen is soft.  Musculoskeletal:        General: Normal range of motion.     Cervical back: Normal range of motion and neck supple.  Skin:    General: Skin is warm and dry.  Neurological:     General: No focal deficit present.     Mental Status: He is alert and oriented to person, place, and time.  Psychiatric:        Mood and Affect: Mood normal.        Behavior: Behavior normal.        Thought Content: Thought content normal.        Judgment: Judgment normal.     BP 131/81 (BP Location: Left Arm, Patient Position: Sitting, Cuff Size: Normal)   Pulse 71   Temp 98.4 F (36.9 C) (Oral)   Resp 18   Ht 5' 9.5" (1.765 m)  Wt 177 lb (80.3 kg)   SpO2 96%   BMI 25.76 kg/m  Wt Readings from Last 3 Encounters:  07/02/19 177 lb (80.3 kg)  06/20/19 189 lb (85.7 kg)  05/01/19 182 lb 3.2 oz (82.6 kg)     Health Maintenance Due  Topic Date Due  . COVID-19 Vaccine (1) Never done    There are no preventive care  reminders to display for this patient.  Lab Results  Component Value Date   TSH 0.925 03/14/2018   Lab Results  Component Value Date   WBC 13.6 (H) 06/22/2019   HGB 13.5 06/22/2019   HCT 40.8 06/22/2019   MCV 100.2 (H) 06/22/2019   PLT 222 06/22/2019   Lab Results  Component Value Date   NA 140 06/22/2019   K 3.9 06/22/2019   CO2 26 06/22/2019   GLUCOSE 122 (H) 06/22/2019   BUN 15 06/22/2019   CREATININE 0.70 06/22/2019   BILITOT 0.7 06/20/2019   ALKPHOS 36 (L) 06/20/2019   AST 27 06/20/2019   ALT 29 06/20/2019   PROT 6.3 (L) 06/20/2019   ALBUMIN 3.8 06/20/2019   CALCIUM 9.3 06/22/2019   ANIONGAP 10 06/22/2019   No results found for: CHOL No results found for: HDL No results found for: [facility] Lab Results  Component Value Date   TRIG 52 05/17/2018   No results found for: CHOLHDL Lab Results  Component Value Date   HGBA1C 6.2 (H) 06/22/2019      Assessment & Plan:   Problem List Items Addressed This Visit      Cardiovascular and Mediastinum   Hypertension (Chronic)   Relevant Medications   losartan (COZAAR) 100 MG tablet     Respiratory   COPD exacerbation (HCC) - Primary (Chronic)     1. COPD exacerbation (HCC) Continue prednisone 20 mg, patient confirmed he still had adequate supply.  Keep follow-up appointment with [name] on July 18, 2019  2. Essential hypertension Continue current regimen, reviewed hospital note for reason for cardiology referral, encouraged patient to keep cardiology appointment - losartan (COZAAR) 100 MG tablet; Take 1 tablet (100 mg total) by mouth daily.  Dispense: 30 tablet; Refill: 2    I have reviewed the patient's medical history (PMH, PSH, Social History, Family History, Medications, and allergies) , and have been updated if relevant. I spent 20 minutes reviewing chart and  face to face time with patient.    Meds ordered this encounter  Medications  . losartan (COZAAR) 100 MG tablet    Sig: Take 1 tablet (100  mg total) by mouth daily.    Dispense:  30 tablet    Refill:  2    Order Specific Question:   Supervising Provider    Answer:   [name] [[id number]]    Follow-up: Return if symptoms worsen or fail to improve.    Loraine Grip Mayers, PA-C

## 2019-07-03 ENCOUNTER — Encounter: Payer: Self-pay | Admitting: Cardiovascular Disease

## 2019-07-03 ENCOUNTER — Ambulatory Visit: Payer: Medicaid Other | Admitting: Critical Care Medicine

## 2019-07-03 ENCOUNTER — Ambulatory Visit (INDEPENDENT_AMBULATORY_CARE_PROVIDER_SITE_OTHER): Payer: Medicaid Other | Admitting: Cardiovascular Disease

## 2019-07-03 VITALS — BP 138/64 | HR 100 | Ht 69.5 in | Wt 186.0 lb

## 2019-07-03 DIAGNOSIS — R0602 Shortness of breath: Secondary | ICD-10-CM

## 2019-07-03 MED FILL — SPIRIVA 18 MCG CP-HANDIHALE: 18 | 30 days supply | Qty: 30 | Fill #1

## 2019-07-03 NOTE — Patient Instructions (Signed)
Medication Instructions:  The current medical regimen is effective;  continue present plan and medications.  *If you need a refill on your cardiac medications before your next appointment, please call your pharmacy*    Follow-Up: At [facility], you and your health needs are our priority.  As part of our continuing mission to provide you with exceptional heart care, we have created designated Provider Care Teams.  These Care Teams include your primary Cardiologist (physician) and Advanced Practice Providers (APPs -  Physician Assistants and Nurse Practitioners) who all work together to provide you with the care you need, when you need it.  We recommend signing up for the patient portal called "MyChart".  Sign up information is provided on this After Visit Summary.  MyChart is used to connect with patients for Virtual Visits (Telemedicine).  Patients are able to view lab/test results, encounter notes, upcoming appointments, etc.  Non-urgent messages can be sent to your provider as well.   To learn more about what you can do with MyChart, go to [email or web address].    Your next appointment:   As needed  The format for your next appointment:   In Person  Provider:   Weston Mills O'Neal, MD      

## 2019-07-04 ENCOUNTER — Inpatient Hospital Stay (HOSPITAL_COMMUNITY)
Admission: EM | Admit: 2019-07-04 | Discharge: 2019-07-07 | DRG: 190 | Disposition: A | Discharge status: Home / Self Care | Payer: Medicaid Other | Attending: Internal Medicine | Admitting: Internal Medicine

## 2019-07-04 ENCOUNTER — Emergency Department (HOSPITAL_COMMUNITY): Payer: Medicaid Other

## 2019-07-04 ENCOUNTER — Encounter (HOSPITAL_COMMUNITY): Payer: Self-pay

## 2019-07-04 DIAGNOSIS — F1020 Alcohol dependence, uncomplicated: Secondary | ICD-10-CM

## 2019-07-04 DIAGNOSIS — Z8249 Family history of ischemic heart disease and other diseases of the circulatory system: Secondary | ICD-10-CM

## 2019-07-04 DIAGNOSIS — J440 Chronic obstructive pulmonary disease with acute lower respiratory infection: Secondary | ICD-10-CM | POA: Diagnosis present

## 2019-07-04 DIAGNOSIS — Z77120 Contact with and (suspected) exposure to mold (toxic): Secondary | ICD-10-CM

## 2019-07-04 DIAGNOSIS — R739 Hyperglycemia, unspecified: Secondary | ICD-10-CM | POA: Diagnosis present

## 2019-07-04 DIAGNOSIS — Z8701 Personal history of pneumonia (recurrent): Secondary | ICD-10-CM | POA: Diagnosis not present

## 2019-07-04 DIAGNOSIS — Z825 Family history of asthma and other chronic lower respiratory diseases: Secondary | ICD-10-CM

## 2019-07-04 DIAGNOSIS — J449 Chronic obstructive pulmonary disease, unspecified: Secondary | ICD-10-CM | POA: Diagnosis not present

## 2019-07-04 DIAGNOSIS — J9601 Acute respiratory failure with hypoxia: Secondary | ICD-10-CM | POA: Insufficient documentation

## 2019-07-04 DIAGNOSIS — Z79899 Other long term (current) drug therapy: Secondary | ICD-10-CM | POA: Diagnosis not present

## 2019-07-04 DIAGNOSIS — J80 Acute respiratory distress syndrome: Secondary | ICD-10-CM | POA: Diagnosis not present

## 2019-07-04 DIAGNOSIS — J432 Centrilobular emphysema: Secondary | ICD-10-CM | POA: Diagnosis not present

## 2019-07-04 DIAGNOSIS — K219 Gastro-esophageal reflux disease without esophagitis: Secondary | ICD-10-CM | POA: Diagnosis not present

## 2019-07-04 DIAGNOSIS — F79 Unspecified intellectual disabilities: Secondary | ICD-10-CM | POA: Diagnosis present

## 2019-07-04 DIAGNOSIS — J439 Emphysema, unspecified: Secondary | ICD-10-CM | POA: Diagnosis not present

## 2019-07-04 DIAGNOSIS — A419 Sepsis, unspecified organism: Secondary | ICD-10-CM

## 2019-07-04 DIAGNOSIS — F17290 Nicotine dependence, other tobacco product, uncomplicated: Secondary | ICD-10-CM | POA: Diagnosis present

## 2019-07-04 DIAGNOSIS — J189 Pneumonia, unspecified organism: Secondary | ICD-10-CM | POA: Diagnosis present

## 2019-07-04 DIAGNOSIS — J441 Chronic obstructive pulmonary disease with (acute) exacerbation: Principal | ICD-10-CM | POA: Diagnosis present

## 2019-07-04 DIAGNOSIS — Z7952 Long term (current) use of systemic steroids: Secondary | ICD-10-CM | POA: Diagnosis not present

## 2019-07-04 DIAGNOSIS — J180 Bronchopneumonia, unspecified organism: Secondary | ICD-10-CM | POA: Diagnosis not present

## 2019-07-04 DIAGNOSIS — R0689 Other abnormalities of breathing: Secondary | ICD-10-CM | POA: Diagnosis not present

## 2019-07-04 DIAGNOSIS — R0602 Shortness of breath: Secondary | ICD-10-CM | POA: Diagnosis not present

## 2019-07-04 DIAGNOSIS — Z7951 Long term (current) use of inhaled steroids: Secondary | ICD-10-CM

## 2019-07-04 DIAGNOSIS — J9622 Acute and chronic respiratory failure with hypercapnia: Secondary | ICD-10-CM | POA: Diagnosis not present

## 2019-07-04 DIAGNOSIS — I10 Essential (primary) hypertension: Secondary | ICD-10-CM | POA: Diagnosis present

## 2019-07-04 DIAGNOSIS — R000 Tachycardia, unspecified: Secondary | ICD-10-CM | POA: Diagnosis not present

## 2019-07-04 DIAGNOSIS — Z20822 Contact with and (suspected) exposure to covid-19: Secondary | ICD-10-CM | POA: Diagnosis present

## 2019-07-04 DIAGNOSIS — T17908A Unspecified foreign body in respiratory tract, part unspecified causing other injury, initial encounter: Secondary | ICD-10-CM

## 2019-07-04 DIAGNOSIS — R6520 Severe sepsis without septic shock: Secondary | ICD-10-CM | POA: Diagnosis not present

## 2019-07-04 DIAGNOSIS — J9621 Acute and chronic respiratory failure with hypoxia: Secondary | ICD-10-CM | POA: Diagnosis present

## 2019-07-04 DIAGNOSIS — F1010 Alcohol abuse, uncomplicated: Secondary | ICD-10-CM | POA: Diagnosis present

## 2019-07-04 DIAGNOSIS — R0902 Hypoxemia: Secondary | ICD-10-CM | POA: Diagnosis not present

## 2019-07-04 DIAGNOSIS — J90 Pleural effusion, not elsewhere classified: Secondary | ICD-10-CM | POA: Diagnosis not present

## 2019-07-04 LAB — URINALYSIS, ROUTINE W REFLEX MICROSCOPIC
Bilirubin Urine: NEGATIVE
Glucose, UA: >=500 mg/dL — AB
Hgb urine dipstick: NEGATIVE
Ketones, ur: NEGATIVE mg/dL
Leukocytes,Ua: NEGATIVE
Nitrite: NEGATIVE
Protein, ur: NEGATIVE mg/dL
Specific Gravity, Urine: 1.021 (ref 1.005–1.030)
pH: 5 (ref 5.0–8.0)

## 2019-07-04 LAB — CBC WITH DIFFERENTIAL/PLATELET
Abs Immature Granulocytes: 0.24 10*3/uL — ABNORMAL HIGH (ref 0.00–0.07)
Basophils Absolute: 0.1 10*3/uL (ref 0.0–0.1)
Basophils Relative: 0 %
Eosinophils Absolute: 0.1 10*3/uL (ref 0.0–0.5)
Eosinophils Relative: 0 %
HCT: 47.2 % (ref 39.0–52.0)
Hemoglobin: 15 g/dL (ref 13.0–17.0)
Immature Granulocytes: 1 %
Lymphocytes Relative: 11 %
Lymphs Abs: 3.7 10*3/uL (ref 0.7–4.0)
MCH: 32.8 pg (ref 26.0–34.0)
MCHC: 31.8 g/dL (ref 30.0–36.0)
MCV: 103.3 fL — ABNORMAL HIGH (ref 80.0–100.0)
Monocytes Absolute: 1.6 10*3/uL — ABNORMAL HIGH (ref 0.1–1.0)
Monocytes Relative: 5 %
Neutro Abs: 28.9 10*3/uL — ABNORMAL HIGH (ref 1.7–7.7)
Neutrophils Relative %: 83 %
Platelets: 359 10*3/uL (ref 150–400)
RBC: 4.57 MIL/uL (ref 4.22–5.81)
RDW: 12.7 % (ref 11.5–15.5)
WBC: 34.7 10*3/uL — ABNORMAL HIGH (ref 4.0–10.5)
nRBC: 0 % (ref 0.0–0.2)

## 2019-07-04 LAB — COMPREHENSIVE METABOLIC PANEL
ALT: 27 U/L (ref 0–44)
AST: 22 U/L (ref 15–41)
Albumin: 3.5 g/dL (ref 3.5–5.0)
Alkaline Phosphatase: 52 U/L (ref 38–126)
Anion gap: 10 (ref 5–15)
BUN: 9 mg/dL (ref 6–20)
CO2: 28 mmol/L (ref 22–32)
Calcium: 9.2 mg/dL (ref 8.9–10.3)
Chloride: 105 mmol/L (ref 98–111)
Creatinine, Ser: 0.86 mg/dL (ref 0.61–1.24)
GFR calc Af Amer: >60 mL/min (ref >60)
GFR calc non Af Amer: >60 mL/min (ref >60)
Glucose, Bld: 148 mg/dL — ABNORMAL HIGH (ref 70–99)
Potassium: 4.3 mmol/L (ref 3.5–5.1)
Sodium: 143 mmol/L (ref 135–145)
Total Bilirubin: 0.4 mg/dL (ref 0.3–1.2)
Total Protein: 6.5 g/dL (ref 6.5–8.1)

## 2019-07-04 LAB — LACTIC ACID, PLASMA
Lactic Acid, Venous: 1.6 mmol/L (ref 0.5–1.9)
Lactic Acid, Venous: 3.8 mmol/L (ref 0.5–1.9)
Lactic Acid, Venous: 2.2 mmol/L (ref 0.5–1.9)

## 2019-07-04 LAB — GLUCOSE, CAPILLARY: Glucose-Capillary: 105 mg/dL — ABNORMAL HIGH (ref 70–99)

## 2019-07-04 LAB — PROCALCITONIN: Procalcitonin: <0.1 ng/mL

## 2019-07-04 LAB — CBG MONITORING, ED
Glucose-Capillary: 244 mg/dL — ABNORMAL HIGH (ref 70–99)
Glucose-Capillary: 201 mg/dL — ABNORMAL HIGH (ref 70–99)

## 2019-07-04 LAB — PROTIME-INR
INR: 0.9 (ref 0.8–1.2)
Prothrombin Time: 12.2 seconds (ref 11.4–15.2)

## 2019-07-04 LAB — D-DIMER, QUANTITATIVE: D-Dimer, Quant: 0.41 ug/mL-FEU (ref 0.00–0.50)

## 2019-07-04 LAB — SARS CORONAVIRUS 2 BY RT PCR (HOSPITAL ORDER, PERFORMED IN ~~LOC~~ HOSPITAL LAB): SARS Coronavirus 2: NEGATIVE

## 2019-07-04 LAB — ETHANOL: Alcohol, Ethyl (B): <10 mg/dL (ref <10)

## 2019-07-04 LAB — APTT: aPTT: 25 seconds (ref 24–36)

## 2019-07-04 MED ORDER — ACETAMINOPHEN 325 MG PO TABS: 650.0000 mg | ORAL_TABLET | Freq: Four times a day (QID) | ORAL | Status: DC | PRN

## 2019-07-04 MED ORDER — MOMETASONE FURO-FORMOTEROL FUM 200-5 MCG/ACT IN AERO
2.0000 | INHALATION_SPRAY | Freq: Two times a day (BID) | RESPIRATORY_TRACT | Status: DC
  Administered 2019-07-04 – 2019-07-06 (×6): 2 via RESPIRATORY_TRACT
  Filled 2019-07-04 (×2): qty 8.8

## 2019-07-04 MED ORDER — ENOXAPARIN SODIUM 40 MG/0.4ML ~~LOC~~ SOLN
40.0000 mg | SUBCUTANEOUS | Status: DC
  Administered 2019-07-04 – 2019-07-07 (×4): 40 mg via SUBCUTANEOUS
  Filled 2019-07-04 (×4): qty 0.4

## 2019-07-04 MED ORDER — LORAZEPAM 2 MG/ML IJ SOLN: 0.0000 mg | Freq: Two times a day (BID) | INTRAMUSCULAR | Status: DC

## 2019-07-04 MED ORDER — AZITHROMYCIN 500 MG PO TABS
250.0000 mg | ORAL_TABLET | Freq: Every day | ORAL | Status: DC
  Administered 2019-07-05 – 2019-07-07 (×3): 250 mg via ORAL
  Filled 2019-07-04 (×3): qty 1

## 2019-07-04 MED ORDER — THIAMINE HCL 100 MG PO TABS
100.0000 mg | ORAL_TABLET | Freq: Every day | ORAL | Status: DC
  Administered 2019-07-04 – 2019-07-07 (×4): 100 mg via ORAL
  Filled 2019-07-04 (×3): qty 1

## 2019-07-04 MED ORDER — ACETAMINOPHEN 650 MG RE SUPP: 650.0000 mg | Freq: Four times a day (QID) | RECTAL | Status: DC | PRN

## 2019-07-04 MED ORDER — THIAMINE HCL 100 MG/ML IJ SOLN: 100.0000 mg | Freq: Every day | INTRAMUSCULAR | Status: DC

## 2019-07-04 MED ORDER — ACETAMINOPHEN 650 MG RE SUPP
650.0000 mg | Freq: Once | RECTAL | Status: DC
  Filled 2019-07-04: qty 1

## 2019-07-04 MED ORDER — FOLIC ACID 1 MG PO TABS
1.0000 mg | ORAL_TABLET | Freq: Every day | ORAL | Status: DC
  Administered 2019-07-04 – 2019-07-07 (×4): 1 mg via ORAL
  Filled 2019-07-04 (×3): qty 1

## 2019-07-04 MED ORDER — LORAZEPAM 1 MG PO TABS
1.0000 mg | ORAL_TABLET | ORAL | Status: AC | PRN
  Administered 2019-07-05 – 2019-07-06 (×2): 1 mg via ORAL
  Filled 2019-07-04 (×3): qty 1

## 2019-07-04 MED ORDER — POLYETHYLENE GLYCOL 3350 17 G PO PACK
17.0000 g | PACK | Freq: Every day | ORAL | Status: DC | PRN
  Administered 2019-07-06: 17 g via ORAL
  Filled 2019-07-04 (×2): qty 1

## 2019-07-04 MED ORDER — INSULIN ASPART 100 UNIT/ML ~~LOC~~ SOLN
0.0000 [IU] | Freq: Three times a day (TID) | SUBCUTANEOUS | Status: DC
  Administered 2019-07-04: 3 [IU] via SUBCUTANEOUS
  Administered 2019-07-04: 2 [IU] via SUBCUTANEOUS
  Administered 2019-07-05 (×2): 5 [IU] via SUBCUTANEOUS
  Administered 2019-07-06 – 2019-07-07 (×3): 2 [IU] via SUBCUTANEOUS

## 2019-07-04 MED ORDER — LACTATED RINGERS IV BOLUS
1000.0000 mL | Freq: Once | INTRAVENOUS | Status: AC
  Administered 2019-07-04: 1000 mL via INTRAVENOUS

## 2019-07-04 MED ORDER — SODIUM CHLORIDE 0.9 % IV BOLUS
1000.0000 mL | Freq: Once | INTRAVENOUS | Status: AC
  Administered 2019-07-04: 1000 mL via INTRAVENOUS

## 2019-07-04 MED ORDER — IPRATROPIUM-ALBUTEROL 0.5-2.5 (3) MG/3ML IN SOLN
3.0000 mL | Freq: Four times a day (QID) | RESPIRATORY_TRACT | Status: DC
  Administered 2019-07-04 – 2019-07-06 (×8): 3 mL via RESPIRATORY_TRACT
  Filled 2019-07-04 (×8): qty 3

## 2019-07-04 MED ORDER — SODIUM CHLORIDE 0.9 % IV SOLN
2.0000 g | Freq: Once | INTRAVENOUS | Status: AC
  Administered 2019-07-04: 2 g via INTRAVENOUS
  Filled 2019-07-04: qty 2

## 2019-07-04 MED ORDER — PREDNISONE 20 MG PO TABS
40.0000 mg | ORAL_TABLET | Freq: Every day | ORAL | Status: DC
  Administered 2019-07-05 – 2019-07-07 (×3): 40 mg via ORAL
  Filled 2019-07-04 (×4): qty 2

## 2019-07-04 MED ORDER — SODIUM CHLORIDE 0.9 % IV SOLN
500.0000 mg | Freq: Once | INTRAVENOUS | Status: AC
  Administered 2019-07-05: 500 mg via INTRAVENOUS
  Filled 2019-07-04 (×2): qty 500

## 2019-07-04 MED ORDER — LACTATED RINGERS IV BOLUS: 1000.0000 mL | Freq: Once | INTRAVENOUS | Status: DC

## 2019-07-04 MED ORDER — VANCOMYCIN HCL 1750 MG/350ML IV SOLN
1750.0000 mg | Freq: Once | INTRAVENOUS | Status: AC
  Administered 2019-07-04: 1750 mg via INTRAVENOUS
  Filled 2019-07-04: qty 350

## 2019-07-04 MED ORDER — ACETAMINOPHEN 500 MG PO TABS: 1000.0000 mg | ORAL_TABLET | Freq: Once | ORAL | Status: DC

## 2019-07-04 MED ORDER — ACETAMINOPHEN 325 MG PO TABS: 650.0000 mg | ORAL_TABLET | Freq: Once | ORAL | Status: DC

## 2019-07-04 MED ORDER — LACTATED RINGERS IV SOLN: INTRAVENOUS | Status: AC

## 2019-07-04 MED ORDER — LORAZEPAM 2 MG/ML IJ SOLN
0.0000 mg | Freq: Four times a day (QID) | INTRAMUSCULAR | Status: DC
  Administered 2019-07-04: 1 mg via INTRAVENOUS
  Filled 2019-07-04: qty 1

## 2019-07-04 MED ORDER — LORAZEPAM 1 MG PO TABS: 0.0000 mg | ORAL_TABLET | Freq: Two times a day (BID) | ORAL | Status: DC

## 2019-07-04 MED ORDER — LORAZEPAM 1 MG PO TABS: 0.0000 mg | ORAL_TABLET | Freq: Four times a day (QID) | ORAL | Status: DC

## 2019-07-04 MED ORDER — ADULT MULTIVITAMIN W/MINERALS CH
1.0000 | ORAL_TABLET | Freq: Every day | ORAL | Status: DC
  Administered 2019-07-04 – 2019-07-07 (×4): 1 via ORAL
  Filled 2019-07-04 (×4): qty 1

## 2019-07-04 MED ORDER — THIAMINE HCL 100 MG PO TABS
100.0000 mg | ORAL_TABLET | Freq: Every day | ORAL | Status: DC
  Administered 2019-07-04: 100 mg via ORAL
  Filled 2019-07-04: qty 1

## 2019-07-04 MED ORDER — ACETAMINOPHEN 325 MG PO TABS
650.0000 mg | ORAL_TABLET | Freq: Once | ORAL | Status: AC
  Administered 2019-07-04: 650 mg via ORAL
  Filled 2019-07-04: qty 2

## 2019-07-04 MED ORDER — LORAZEPAM 2 MG/ML IJ SOLN
1.0000 mg | INTRAMUSCULAR | Status: AC | PRN
  Administered 2019-07-04 – 2019-07-07 (×3): 2 mg via INTRAVENOUS
  Filled 2019-07-04 (×3): qty 1

## 2019-07-04 MED ORDER — LACTATED RINGERS IV SOLN: INTRAVENOUS | Status: DC

## 2019-07-04 NOTE — H&P (Signed)
Date: 07/04/2019               Patient Name:  James [name] MRN: [id number]  DOB: [birth year] Age / Sex: 59 y.o., male   PCP: [name], MD         Medical Service: Internal Medicine Teaching Service         Attending Physician: Dr. Heber Valatie, Rachel Moulds, DO    First Contact: Marva Panda, MD, [place] Pager: [place] [phone])  Second Contact: Sherry Ruffing, MD, [place] Pager: [place] [phone])       After Hours (After 5p/  First Contact Pager: [phone]  weekends / holidays): Second Contact Pager: [phone]   Chief Complaint: shortness of breath  History of Present Illness: 59 y.o. yo male w/ PMH significant for COPD, hypertension, alcohol use disorder who was recently admitted from 06/19/16 for COPD exacerbation and RLL pneumonia presenting with acute respiratory distress.   Patient reports having issues with his breathing, reports it started yesterday morning and it awoken him from sleep, he has been coughing up brown mucus but is not sure if the amount has increased. He reports that he's been taking his medications as prescribed. He had been discharge with albuterol as needed, dulera, spiriva, and a 15 day course of prednisone. He denies any fevers, chills, chest pain, dysuria, hematuria, abdominal pain, nausea, vomiting, headaches, diarrhea, constipation, or other issues. Denies any known sick contacts. He reports that he would like [place] to call his younger sister.   ED course: Patient presented via EMS with acute respiratory distress. Noted to be hypoxic to 87% with diminished lung sounds and wheezing for which he was given albuterol, atrovent, solumedrol and magnesium. In the ED, noted to be febrile to 101.8 and tachycardic. Neutrophil predominant leukocytosis on labs. CXR with improving pneumonia. He was on BiPAP when we evaluated him.   Meds:  Current Meds  Medication Sig  . albuterol (PROVENTIL) (2.5 MG/3ML) 0.083% nebulizer solution Take 3 mLs (2.5 mg total) by nebulization every 6 (six)  hours as needed for wheezing or shortness of breath.  [name] albuterol (VENTOLIN HFA) 108 (90 Base) MCG/ACT inhaler Inhale 2 puffs into the lungs every 6 (six) hours as needed for wheezing or shortness of breath.  . folic acid (FOLVITE) 1 MG tablet Take 1 tablet (1 mg total) by mouth daily.  [name] losartan (COZAAR) 100 MG tablet Take 1 tablet (100 mg total) by mouth daily.  . predniSONE (DELTASONE) 20 MG tablet Take 2 tablets (40 mg total) by mouth daily for 15 days.  [name] Respiratory Therapy Supplies (FLUTTER) DEVI Use 4 times daily (Patient taking differently: 1 each by Other route in the morning, at noon, in the evening, and at bedtime. )  . Spacer/Aero-Holding Chambers (AEROCHAMBER MV) inhaler Use as instructed (Patient taking differently: 1 each by Other route See admin instructions. Use as instructed)  . tiotropium (SPIRIVA HANDIHALER) 18 MCG inhalation capsule Place 1 capsule (18 mcg total) into inhaler and inhale daily.     Allergies: Allergies as of 07/04/2019 - Review Complete 07/04/2019  Allergen Reaction Noted  . Gabapentin Other (See Comments) 10/05/2018   Past Medical History:  Diagnosis Date  . Acute on chronic respiratory failure with hypoxia ([place]) 06/08/2013  . CAP (community acquired pneumonia) 05/17/2018   See admit 05/17/18 ? rml  with   covid pcr neg - rx augmentin > f/u cxr in 4-6 weeks is fine unless condition declines   . COPD (chronic obstructive pulmonary disease) ([place])  not on home  . Dyspnea   . Hypertension   . Pneumonia 04/06/2016  . Pneumothorax    2016, fell from ladder  . 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 Tick bite 06/03/2018    Family History:  Family History  Problem Relation Age of Onset  . Heart disease Father   . COPD Sister      Social History:  Social History   Tobacco Use  . Smoking status: Former Smoker    Packs/day: 1.50    Years: 40.00    Pack years: 60.00    Types: Cigarettes    Quit date: 2014    Years since quitting: 7.4  .  Smokeless tobacco: Current User    Types: Chew  Vaping Use  . Vaping Use: Never used  Substance Use Topics  . Alcohol use: Yes    Alcohol/week: 28.0 standard drinks    Types: 28 Cans of beer per week    Comment: 4 beers a night; + jitters with not drinking, denies DTs or seizures  . Drug use: Yes    Types: Marijuana    Comment: daily; quit using crack and methamphetamine about 5 months ago      Review of Systems: A complete ROS was negative except as per HPI.   Physical Exam: Blood pressure (!) 92/49, pulse 95, temperature 99 F (37.2 C), temperature source Oral, resp. rate (!) 22, height 5' 9.5" (1.765 m), weight 83.9 kg, SpO2 95 %. Physical Exam Constitutional:      Comments: Tired appearing, NAD, on BiPAP  HENT:     Head: Normocephalic and atraumatic.  Eyes:     Extraocular Movements: Extraocular movements intact.     Pupils: Pupils are equal, round, and reactive to light.  Cardiovascular:     Rate and Rhythm: Normal rate and regular rhythm.     Pulses: Normal pulses.     Heart sounds: Normal heart sounds.  Pulmonary:     Comments: On BiPAP, normal work of breathing, no accessory muscle use, decreased breath sounds throughout, rhonchi in RLL, mild wheezing in BL upper lobes Abdominal:     General: Abdomen is flat. Bowel sounds are normal. There is no distension.     Palpations: Abdomen is soft.     Tenderness: There is no abdominal tenderness.  Musculoskeletal:        General: No swelling. Normal range of motion.     Cervical back: Normal range of motion and neck supple.  Skin:    General: Skin is warm and dry.     Capillary Refill: Capillary refill takes less than 2 seconds.  Neurological:     Mental Status: He is disoriented.  Psychiatric:        Mood and Affect: Mood normal.        Behavior: Behavior normal.    EKG: personally reviewed my interpretation is sinus tachycardia, HR around 150, no obvious ST changes  CXR: personally reviewed my interpretation is  hyperinflated lungs, RLL infiltrates (improved from prior)  @IMAGESENCORD @   Assessment & Plan by Problem: Active Problems:   COPD exacerbation ([place])  This is a 59 year old male with a history of COPD, hypertension, and alcohol use disorder who was recently admitted for a COPD exacerbation secondary to right lower lobe pneumonia presenting with acute hypoxic respiratory failure.   COPD exacerbation: Patient presenting with acute hypoxic respiratory failure that started yesterday morning. He reported taking his medications as prescribed.  He was discharged on the 16th with a 15-day course  of prednisone, Spiriva, Dulera, and albuterol as needed.  He may have just finished the prednisone course.  EMS noted him to be hypoxic and he was treated with Solu-Medrol, magnesium, and albuterol.  Here he was noted to be febrile to 101.8, tachycardic, and tachypneic. His exam showed RLL rhonchi, decreased breath sounds, and diffuse wheezing. His labs are significant for a leukocytosis of 34, CO2 of 28, normal electrolytes.  Chest x-ray showed improvement in the right lower lobe pneumonia. It is unclear what is causing the fever. It could be a pulmonary source however he does not have increased mucus production or cough, he denies any abdominal symptoms or urinary symptoms. He received 1 dose of antibiotics in the ED. Will treat as a COPD exacerbation for now.   -Duonebs PRN -Azithromycin daily x5 day -Prednisone 40 mg daily (will need long wean) -BiPAP PRN, supplamental O2 PRN  ?Early sepsis: Noted to be febrile to 101. Also has elevated WBC and tachycardia. Had a soft BP while we were evaluating, systolic around 003. LA normal. COVID test negative. Possibly 2/2 pulmonary source such as persistent pneumonia. He does have a history of pneumonia and had previously been admitted from 6/16-6/18 for this however CXR today showed improvement in his pneumonia. Given vanc and zosyn in the ED. Denies any other  infectious symptoms including chest pain, abdominal pain, or urinary symptoms. Denies any skin abnormalities. Another possibility is a PE, his Wells score is 1.5 indicating a low risk, can evaluate with a D-dimer.   -1L LR then start maintenance fluids -F/u blood cultures and urine cultures -Pro-calcitonin is < 0.1 -D-dimer -Hold off on further antibiotics for now, will monitor for fevers  EtOH history: History of withdrawal. Drinks 6 beers daily.   -CIWA with ativan -Thiamine, LR, and MV  HTN: On losartan 100 mg daily at home. BP have been normotensive here. Will hold off on resuming home meds for now.   Intellectual disability: Per prior notes, lives alone and he is illiterate. Unclear if he has been taking his medications.   Hyperglycemia: Glucose elvated at 148 on admission, normal AG. Noted to have glucosuria. No history of DM. -Check A1c -SSI  FEN: LR 100cc/hr, replete lytes prn, HH diet  VTE ppx: Lovenox  Code Status: FULL    Dispo: Admit patient to Inpatient with expected length of stay greater than 2 midnights.  Signed: Asencion Noble, MD 07/04/2019, 1:43 PM  Pager: @[phone] @

## 2019-07-04 NOTE — ED Triage Notes (Signed)
Pt BIB [facility] from home   Woke up in acute resp distress. hx COPD  87% RA, lung sounds diminished and wheezing throughout.   10 albuterol  1 Atrovent 125 solumedrol   2 g mag  A&Ox4 on arrival

## 2019-07-04 NOTE — Progress Notes (Signed)
Patient taken off bipap and placed on 2L [place]. Patient tolerating well and vitals are stable. RN made aware.

## 2019-07-04 NOTE — ED Provider Notes (Signed)
[facility] EMERGENCY DEPARTMENT Provider Note   CSN: [id number] Arrival date & time: 07/04/19  4166     History Chief Complaint  Patient presents with   Respiratory Distress    IDO WOLLMAN is a 59 y.o. male with a history of COPD, hypertensin & EtOH dependence who presents to the ED via EMS for evaluation of respiratory distress with onset this morning.  Patient states that he has been feeling poorly for the past 2 to 3 days with dyspnea, wheezing, and cough.  He has been utilizing his breathing treatments at home without much relief.  He states that this morning this acutely worsened which prompted call to 911.  This feels like when he has required BiPAP admission in the past.  He denies fever, chest pain, or syncope.  He has not had his COVID-19 vaccine to this point.  Per EMS he was saturating in the mid 80s on room air on their arrival, he was given 10 albuterol, 1 of Atrovent, 125 of Solu-Medrol, and 2 of mag in route with some mild improvement.  Per chart review for additional history recent admission 06/20/19-06/22/19 for COPD exacerbation with right lower lobe pneumonia- given ceftriaxone, azithromycin, prednisone, & nebs. Required bipap upon initial ED Presentation. Does have hx of EtOH abuse, drinks 6 beers daily, monitored on CIWA with ativan during last admission, CIWA scores 0-2 during & did not require ativan.   Level 5 Caveat applies secondary to respiratory distress.    HPI     Past Medical History:  Diagnosis Date   Acute on chronic respiratory failure with hypoxia ([place]) 06/08/2013   CAP (community acquired pneumonia) 05/17/2018   See admit 05/17/18 ? 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   Tick bite 06/03/2018    Patient Active Problem List   Diagnosis Date Noted   Other atopic dermatitis 05/01/2019   Acute on chronic respiratory failure with hypoxia and hypercapnia ([place]) 04/23/2019   Intellectual disability 04/23/2019   Allergy to mold 11/23/2018   Marijuana abuse 08/09/2018   Community acquired pneumonia of right lower lobe of lung 05/17/2018   COPD exacerbation ([place]) 02/16/2018   Hypertension 05/17/2016   Low back pain radiating to both legs 04/18/2016   History of tobacco abuse 04/06/2016   Alcohol dependence syndrome ([place]) 04/06/2016   COPD mixed type ([place]) 03/26/2016    Past Surgical History:  Procedure Laterality Date   VIDEO ASSISTED THORACOSCOPY (VATS)/THOROCOTOMY Left 06/11/2013   Procedure: VIDEO ASSISTED THORACOSCOPY (VATS)/THOROCOTOMY;  Surgeon: Ivin Poot, MD;  Location: [facility];  Service: Thoracic;  Laterality: Left;   VIDEO BRONCHOSCOPY N/A 06/11/2013   Procedure: VIDEO BRONCHOSCOPY;  Surgeon: Ivin Poot, MD;  Location: [facility];  Service: Thoracic;  Laterality: N/A;   VIDEO BRONCHOSCOPY N/A 08/10/2018   Procedure: VIDEO BRONCHOSCOPY WITH FLUORO;  Surgeon: Candee Furbish, MD;  Location: [facility] ENDOSCOPY;  Service: Endoscopy;  Laterality: N/A;       Family History  Problem Relation Age of Onset   Heart disease Father    COPD Sister     Social History   Tobacco Use   Smoking status: Former Smoker  Packs/day: 1.50    Years: 40.00    Pack years: 60.00    Types: Cigarettes    Quit date: 2014    Years since quitting: 7.4   Smokeless tobacco: Current User    Types: Chew  Vaping Use   Vaping Use: Never used  Substance Use Topics   Alcohol use: Yes    Alcohol/week: 28.0 standard drinks    Types: 28 Cans of beer per week    Comment: 4 beers a night; + jitters with not drinking, denies DTs or seizures   Drug use: Yes    Types: Marijuana    Comment: daily; quit using crack and methamphetamine about 5 months  ago     Home Medications Prior to Admission medications   Medication Sig Start Date End Date Taking? Authorizing Provider  albuterol (PROVENTIL) (2.5 MG/3ML) 0.083% nebulizer solution Take 3 mLs (2.5 mg total) by nebulization every 6 (six) hours as needed for wheezing or shortness of breath. 04/27/19   Ghimire, Henreitta Leber, MD  albuterol (VENTOLIN HFA) 108 (90 Base) MCG/ACT inhaler Inhale 2 puffs into the lungs every 6 (six) hours as needed for wheezing or shortness of breath. 04/27/19   Ghimire, Henreitta Leber, MD  folic acid (FOLVITE) 1 MG tablet Take 1 tablet (1 mg total) by mouth daily. 04/27/19   Ghimire, Henreitta Leber, MD  losartan (COZAAR) 100 MG tablet Take 1 tablet (100 mg total) by mouth daily. 07/02/19   Mayers, Cari S, PA-C  mometasone-formoterol (DULERA) 200-5 MCG/ACT AERO Inhale 2 puffs into the lungs 2 (two) times daily. 04/27/19   Ghimire, Henreitta Leber, MD  predniSONE (DELTASONE) 20 MG tablet Take 2 tablets (40 mg total) by mouth daily for 15 days. 06/22/19 07/07/19  Maudie Mercury, MD  Respiratory Therapy Supplies (FLUTTER) DEVI Use 4 times daily 04/27/18   [name], MD  Spacer/Aero-Holding Chambers (AEROCHAMBER MV) inhaler Use as instructed 08/03/18   [name], MD  thiamine (VITAMIN B-1) 100 MG tablet Take 1 tablet (100 mg total) by mouth daily. 08/03/18   [name], MD  tiotropium (SPIRIVA HANDIHALER) 18 MCG inhalation capsule Place 1 capsule (18 mcg total) into inhaler and inhale daily. 04/27/19 07/26/19  Jonetta Osgood, MD    Allergies    Gabapentin  Review of Systems   Review of Systems  Unable to perform ROS: Severe respiratory distress  Constitutional: Negative for fever.  Respiratory: Positive for cough, shortness of breath and wheezing.   Cardiovascular: Negative for chest pain.  Gastrointestinal: Negative for vomiting.  Neurological: Negative for syncope.    Physical Exam Updated Vital Signs BP (!) 162/106    Pulse (!) 152    Temp (!) 101.8 F (38.8 C)  (Axillary)    Resp (!) 29    Ht 5' 9.5" (1.765 m)    Wt 83.9 kg    SpO2 98%    BMI 26.93 kg/m   Physical Exam Vitals and nursing note reviewed.  Constitutional:      Appearance: He is ill-appearing and diaphoretic. He is not toxic-appearing.  HENT:     Head: Normocephalic and atraumatic.  Eyes:     Pupils: Pupils are equal, round, and reactive to light.  Cardiovascular:     Rate and Rhythm: Regular rhythm. Tachycardia present.  Pulmonary:     Effort: Tachypnea and accessory muscle usage present.     Breath sounds: Decreased air movement (Poor air movement throughout.) present. Wheezing (Biphasic) present.     Comments: Speaking in  short sentences. SPO2 greater than 95% on nebulizer treatment currently. Abdominal:     General: There is no distension.     Palpations: Abdomen is soft.     Tenderness: There is no abdominal tenderness. There is no guarding or rebound.  Musculoskeletal:     Cervical back: Neck supple. No rigidity.     Comments: Trace symmetric pitting edema to the bilateral lower legs.  Neurological:     Mental Status: He is alert.     Comments: Alert.  Clear speech.  Psychiatric:        Behavior: Behavior normal.     ED Results / Procedures / Treatments   Labs (all labs ordered are listed, but only abnormal results are displayed) Labs Reviewed  CBC WITH DIFFERENTIAL/PLATELET - Abnormal; Notable for the following components:      Result Value   WBC 34.7 (*)    MCV 103.3 (*)    All other components within normal limits  COMPREHENSIVE METABOLIC PANEL - Abnormal; Notable for the following components:   Glucose, Bld 148 (*)    All other components within normal limits  CULTURE, BLOOD (ROUTINE X 2)  CULTURE, BLOOD (ROUTINE X 2)  SARS CORONAVIRUS 2 BY RT PCR (HOSPITAL ORDER, [facility])  URINE CULTURE  LACTIC ACID, PLASMA  APTT  PROTIME-INR  LACTIC ACID, PLASMA  URINALYSIS, ROUTINE W REFLEX MICROSCOPIC  POC SARS CORONAVIRUS 2 AG -   ED    EKG EKG Interpretation  Date/Time:  Wednesday July 04 2019 07:03:03 EDT Ventricular Rate:  153 PR Interval:    QRS Duration: 92 QT Interval:  262 QTC Calculation: 418 R Axis:   47 Text Interpretation: Sinus tachycardia Atrial premature complexes Anterior infarct, old Artifact in lead(s) I II Confirmed by Davonna Belling [phone]) on 07/04/2019 7:17:09 AM   Radiology DG Chest Portable 1 View  Result Date: 07/04/2019 CLINICAL DATA:  Shortness of breath and fever EXAM: PORTABLE CHEST 1 VIEW COMPARISON:  06/20/2019 FINDINGS: Continued airspace disease in the right middle and lower lobes with slight improvement. The left lung is relatively clear. Hyperinflation and apical emphysema. Normal heart size and mediastinal contours. Remote left rib and shoulder girdle fractures. Scarring at the lateral left costophrenic sulcus. IMPRESSION: 1. Slightly improved right-sided pneumonia. Recommend continued radiographic follow-up to clearing. 2. COPD Electronically Signed   By: Monte Fantasia M.D.   On: 07/04/2019 07:27    Procedures .Critical Care Performed by: Amaryllis Dyke, PA-C Authorized by: Amaryllis Dyke, PA-C     CRITICAL CARE Performed by: Kennith Maes   Total critical care time: 50 minutes  Critical care time was exclusive of separately billable procedures and treating other patients.  Critical care was necessary to treat or prevent imminent or life-threatening deterioration.  Critical care was time spent personally by me on the following activities: development of treatment plan with patient and/or surrogate as well as nursing, discussions with consultants, evaluation of patient's response to treatment, examination of patient, obtaining history from patient or surrogate, ordering and performing treatments and interventions, ordering and review of laboratory studies, ordering and review of radiographic studies, pulse oximetry and re-evaluation of  patient's condition.   (including critical care time)  Medications Ordered in ED Medications  sodium chloride 0.9 % bolus 1,000 mL (has no administration in time range)  acetaminophen (TYLENOL) suppository 650 mg (has no administration in time range)  LORazepam (ATIVAN) injection 0-4 mg (1 mg Intravenous Given 07/04/19 1038)    Or  LORazepam (ATIVAN) tablet 0-4 mg ( Oral See Alternative 07/04/19 1038)  LORazepam (ATIVAN) injection 0-4 mg (has no administration in time range)    Or  LORazepam (ATIVAN) tablet 0-4 mg (has no administration in time range)  thiamine tablet 100 mg (has no administration in time range)    Or  thiamine (B-1) injection 100 mg (has no administration in time range)  ceFEPIme (MAXIPIME) 2 g in sodium chloride 0.9 % 100 mL IVPB (0 g Intravenous Stopped 07/04/19 1033)  vancomycin (VANCOREADY) IVPB 1750 mg/350 mL (0 mg Intravenous Stopped 07/04/19 1107)    ED Course  I have reviewed the triage vital signs and the nursing notes.  Pertinent labs & imaging results that were available during my care of the patient were reviewed by me and considered in my medical decision making (see chart for details).    CURLY MACKOWSKI was evaluated in Emergency Department on 07/04/2019 for the symptoms described in the history of present illness. He/she was evaluated in the context of the global COVID-19 pandemic, which necessitated consideration that the patient might be at risk for infection with the SARS-CoV-2 virus that causes COVID-19. Institutional protocols and algorithms that pertain to the evaluation of patients at risk for COVID-19 are in a state of rapid change based on information released by regulatory bodies including the CDC and federal and state organizations. These policies and algorithms were followed during the patient's care in the ED.  MDM Rules/Calculators/A&P                         Patient presents to the ED via EMS in respiratory distress.  He was hypoxic on EMS  assessment, SpO2 improved with interventions PTA but remains tachypneic w/ accessory muscle use, poor air movement, and biphasic wheezing.  He is notably tachycardic and hypertensive as well.  Ddx: COPD exacerbation, pneumonia, Covid, CHF, pulmonary embolism, arrhythmia, critical anemia.  Additional history obtained:  Additional history obtained from EMS & chart review. Previous records obtained and reviewed- as detailed above.   Given patient febrile and has not received his COVID-19 vaccines initially held off on BiPAP.Code sepsis initiated with antibiotics, 1 L of normal saline ordered, does not need 30 cc/kg bolus at this time given patient is hypertensive, will continue to monitor this as well as his lactic acid.  07:28: Rapid COVID Negative, significant leukocytosis- BIPAP initiated per discussion with RT & supervising physician Dr. Dewain Penning.  Further discussed with patient, should he require intubation he is amenable to this.  EKG: Sinus tachycardia Atrial premature complexes Anterior infarct, old Artifact in lead(s) I II  Lab Tests:  I Ordered, reviewed, and interpreted labs, which included:  Rapid COVID-19 test: Negative CBC: Significant leukocytosis of 34.7.  No anemia. CMP: Mild hyperglycemia, otherwise unremarkable PT/INR/APTT: Within normal limits Lactic acid: WNL  Imaging Studies ordered:  I ordered imaging studies which included CXR, I independently visualized and interpreted imaging which showed 1. Slightly improved right-sided pneumonia. Recommend continued radiographic follow-up to clearing. 2. COPD  08:00: RE-EVAL: Improved on BiPAP.   Given hx of EtOH abuse placed on CIWA protocol in the ED.   10:30: RE-EVAL: Continued improvement on BIPAP, breathing much more comfortably, HR improved, BP normalized.   COVID 19 PCR: Negative.   Discussed weaning of BiPAP with RT given improvement. Will consult for admission for acute hypoxic respiratory failure with COPD exacerbation  & concern for sepsis.  11:40: CONSULT: Discussed with internal medicine residency service- accepts  admission.   This is a shared visit with supervising physician Dr. Alvino Chapel who has independently evaluated patient & provided guidance in evaluation/management/disposition, in agreement with care    Portions of this note were generated with Dragon dictation software. Dictation errors may occur despite best attempts at proofreading.  Final Clinical Impression(s) / ED Diagnoses Final diagnoses:  COPD exacerbation ([place])  Acute hypoxemic respiratory failure (HCC)  Sepsis, due to unspecified organism, unspecified whether acute organ dysfunction present [facility])    Rx / DC Orders ED Discharge Orders    None       Amaryllis Dyke, PA-C 07/04/19 1142    Davonna Belling, MD 07/04/19 1423

## 2019-07-05 ENCOUNTER — Inpatient Hospital Stay (HOSPITAL_COMMUNITY): Payer: Medicaid Other

## 2019-07-05 ENCOUNTER — Other Ambulatory Visit: Payer: Self-pay

## 2019-07-05 DIAGNOSIS — J441 Chronic obstructive pulmonary disease with (acute) exacerbation: Principal | ICD-10-CM

## 2019-07-05 LAB — GLUCOSE, CAPILLARY
Glucose-Capillary: 106 mg/dL — ABNORMAL HIGH (ref 70–99)
Glucose-Capillary: 115 mg/dL — ABNORMAL HIGH (ref 70–99)
Glucose-Capillary: 125 mg/dL — ABNORMAL HIGH (ref 70–99)
Glucose-Capillary: 145 mg/dL — ABNORMAL HIGH (ref 70–99)
Glucose-Capillary: 267 mg/dL — ABNORMAL HIGH (ref 70–99)
Glucose-Capillary: 269 mg/dL — ABNORMAL HIGH (ref 70–99)
Glucose-Capillary: 414 mg/dL — ABNORMAL HIGH (ref 70–99)

## 2019-07-05 LAB — CBC
HCT: 38.2 % — ABNORMAL LOW (ref 39.0–52.0)
Hemoglobin: 12.5 g/dL — ABNORMAL LOW (ref 13.0–17.0)
MCH: 33.4 pg (ref 26.0–34.0)
MCHC: 32.7 g/dL (ref 30.0–36.0)
MCV: 102.1 fL — ABNORMAL HIGH (ref 80.0–100.0)
Platelets: 241 10*3/uL (ref 150–400)
RBC: 3.74 MIL/uL — ABNORMAL LOW (ref 4.22–5.81)
RDW: 12.7 % (ref 11.5–15.5)
WBC: 21.7 10*3/uL — ABNORMAL HIGH (ref 4.0–10.5)
nRBC: 0 % (ref 0.0–0.2)

## 2019-07-05 LAB — COMPREHENSIVE METABOLIC PANEL
ALT: 21 U/L (ref 0–44)
AST: 13 U/L — ABNORMAL LOW (ref 15–41)
Albumin: 2.7 g/dL — ABNORMAL LOW (ref 3.5–5.0)
Alkaline Phosphatase: 35 U/L — ABNORMAL LOW (ref 38–126)
Anion gap: 7 (ref 5–15)
BUN: 14 mg/dL (ref 6–20)
CO2: 29 mmol/L (ref 22–32)
Calcium: 8.9 mg/dL (ref 8.9–10.3)
Chloride: 104 mmol/L (ref 98–111)
Creatinine, Ser: 0.74 mg/dL (ref 0.61–1.24)
GFR calc Af Amer: >60 mL/min (ref >60)
GFR calc non Af Amer: >60 mL/min (ref >60)
Glucose, Bld: 122 mg/dL — ABNORMAL HIGH (ref 70–99)
Potassium: 4.1 mmol/L (ref 3.5–5.1)
Sodium: 140 mmol/L (ref 135–145)
Total Bilirubin: 0.8 mg/dL (ref 0.3–1.2)
Total Protein: 5.2 g/dL — ABNORMAL LOW (ref 6.5–8.1)

## 2019-07-05 LAB — VITAMIN B12: Vitamin B-12: 228 pg/mL (ref 180–914)

## 2019-07-05 LAB — LACTIC ACID, PLASMA: Lactic Acid, Venous: 1.7 mmol/L (ref 0.5–1.9)

## 2019-07-05 LAB — URINE CULTURE: Culture: NO GROWTH

## 2019-07-05 MED ORDER — NICOTINE 14 MG/24HR TD PT24
14.0000 mg | MEDICATED_PATCH | Freq: Once | TRANSDERMAL | Status: AC
  Administered 2019-07-05: 14 mg via TRANSDERMAL
  Filled 2019-07-05: qty 1

## 2019-07-05 MED ORDER — NICOTINE 21 MG/24HR TD PT24: 21.0000 mg | MEDICATED_PATCH | Freq: Once | TRANSDERMAL | Status: DC
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
## 2019-07-05 NOTE — Progress Notes (Signed)
CRITICAL VALUE STICKER  CRITICAL VALUE: CBG 414, pt stated  "I just drank a regular coca-cola.    MD NOTIFIED: Dr. Rivka Safer on call MD.  TIME OF NOTIFICATION: 1225  RESPONSE: Recheck and give sliding insulin per ordered.

## 2019-07-05 NOTE — Progress Notes (Signed)
CSW received call from patient's PCP, [name] at [facility]. He requests hospital MD place a referral order for Medicaid Care Management ([id number] AMB Referral to [place]) now that Medicaid is managed [facility] plan). He requests hope that patient will retire as his job is physically too demanding for his medical condition.   Batoul Limes LCSW

## 2019-07-05 NOTE — TOC Initial Note (Signed)
Transition of Care [facility]) - Initial/Assessment Note    Patient Details  Name: James [name] MRN: [id number] Date of Birth: 11/01/1960  Transition of Care [facility]) CM/SW Contact:    Verdell Carmine, RN Phone Number: 07/05/2019, 4:27 PM  Clinical Narrative:                 Patient in with COPD exacerbation . Followed by the [facility] health and wellness clinic. [name]. Referral in for Medicaid Care Management now that medicaid is managed per CSW> He is still working, but they fear it is too physically demanding.  He is currently on room air. On inhalers. CCM referral , they have seen him. Currently, no needs are identified. Except to continue to follow up.  Expected Discharge Plan: Home/Self Care Barriers to Discharge: Continued Medical Work up   Patient Goals and CMS Choice        Expected Discharge Plan and Services Expected Discharge Plan: Home/Self Care                                              Prior Living Arrangements/Services                       Activities of Daily Living Home Assistive Devices/Equipment: None ADL Screening (condition at time of admission) Patient's cognitive ability adequate to safely complete daily activities?: Yes Is the patient deaf or have difficulty hearing?: No Does the patient have difficulty seeing, even when wearing glasses/contacts?: No Does the patient have difficulty concentrating, remembering, or making decisions?: No Patient able to express need for assistance with ADLs?: Yes Does the patient have difficulty dressing or bathing?: No Independently performs ADLs?: Yes (appropriate for developmental age) Does the patient have difficulty walking or climbing stairs?: No Weakness of Legs: None Weakness of Arms/Hands: None  Permission Sought/Granted                  Emotional Assessment       Orientation: : Oriented to Self, Oriented to Place, Oriented to  Time, Oriented to Situation Alcohol / Substance  Use: Tobacco Use Psych Involvement: No (comment)  Admission diagnosis:  COPD exacerbation ([place]) [J44.1] Acute hypoxemic respiratory failure (HCC) [J96.01] Sepsis, due to unspecified organism, unspecified whether acute organ dysfunction present [facility]) [A41.9] Patient Active Problem List   Diagnosis Date Noted  . Acute hypoxemic respiratory failure ([place])   . Other atopic dermatitis 05/01/2019  . Acute on chronic respiratory failure with hypoxia and hypercapnia ([place]) 04/23/2019  . Intellectual disability 04/23/2019  . 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 Sepsis ([place]) 09/12/2018  . Marijuana abuse 08/09/2018  . Community acquired pneumonia of right lower lobe of lung 05/17/2018  . COPD exacerbation ([place]) 02/16/2018  . Hypertension 05/17/2016  . Low back pain radiating to both legs 04/18/2016  . History of tobacco abuse 04/06/2016  . Alcohol dependence syndrome ([place]) 04/06/2016  . COPD mixed type ([place]) 03/26/2016   PCP:  [name], MD Pharmacy:   [facility], [place] [address] [place] [address] Phone: [phone] Fax: [place], [place] - [address] [place] [address] Phone: [phone] Fax: [phone]     Social Determinants of Health (SDOH) Interventions  Readmission Risk Interventions Readmission Risk Prevention Plan 04/26/2019  Transportation Screening Complete  PCP or Specialist Appt within 3-5 Days Complete  HRI or [place] Complete  Social Work Consult for [place] Planning/Counseling Complete  Palliative Care Screening Not Applicable  Medication Review Press photographer) Complete  Some recent data might be hidden

## 2019-07-05 NOTE — Progress Notes (Signed)
Subjective: Examined patient at bedside. Feeling pretty good and states breathing is now a little bit better. He is on 2L via [place].  States that he was still taking his prednisone from previous hospital admission when he was admitted. He did finish his antibiotics.  Is unemployed at the moment. Patient is not on home O2. States that he ran out of his Spiriva a couple of days ago and symptoms started thereafter. He believes that this is the cause of his current stay. Does have a rescue inhaler at home.  Discussed with patient that [name] wants him to see a pulmonologist.     Objective:   Vital signs in last 24 hours: Vitals:   07/05/19 0800 07/05/19 0831 07/05/19 0832 07/05/19 1151  BP: (!) 152/97 (!) 142/85 (!) 142/85 (!) 142/85  Pulse: 89 89 96 96  Resp: (!) 24 (!) 26 (!) 21 (!) 21  Temp: 98 F (36.7 C)  98.1 F (36.7 C) 98.1 F (36.7 C)  TempSrc: Oral  Oral Oral  SpO2: 96% 96% 97% 94%  Weight:      Height:      Physical Exam Constitutional:      General: He is not in acute distress.    Appearance: Normal appearance. He is not ill-appearing.  HENT:     Head: Normocephalic and atraumatic.  Cardiovascular:     Rate and Rhythm: Normal rate and regular rhythm.     Pulses: Normal pulses.          Radial pulses are 2+ on the right side and 2+ on the left side.       Femoral pulses are 2+ on the right side and 2+ on the left side.    Heart sounds: Normal heart sounds, S1 normal and S2 normal. No murmur heard.   Pulmonary:     Breath sounds: Wheezing present.     Comments: Throughout all lung fields Abdominal:     General: Abdomen is flat. There is no distension.     Palpations: Abdomen is soft. There is no mass.  Musculoskeletal:        General: No swelling or tenderness.  Neurological:     Mental Status: He is alert. Mental status is at baseline.  Psychiatric:        Mood and Affect: Mood normal.        Behavior: Behavior normal.      Assessment/Plan: Mr.  Dohrmann is a 59 yr old male with a PMHx  of COPD, HTN, EtOH use, intellectual disability, and recent hospitalization for pneumonia (discharged 6/18). He was admitted for COPD exacerbation.  Active Problems:   COPD exacerbation (HCC)   Sepsis ([place])  COPD exacerbation: Patient responding to Duonebs, Azithromycin and prednisone. Currently on 2L nasal canula. -continue Duonebs PRN -continue Azithromycin daily 4 days -continue Prednisone  -Welcome pulmonology consult  Sepsis: Not likely due to no apparent source of infection. Additionally he is afebrile since initial temp 6/30 7AM. WBC trending down to 21.7 (34.7 on 6/30). Lactic acid trending down to 1.7 (3.8 on 6/30). Chest X-ray (6/30) shows slight improvement in Right middle and lower lobes. Most likely due to large steroid regiment from last admission. -continue maintenance fluids -F/u blood and urine cultures  EtOH: - Continue CIWA evaluation and give ativan as needed per CIWA  Intellectual disability: Per prior notes, lives alone and he is illiterate. Unclear if he has been taking his medications.  -possible social worker consult to establish education services  FEN: LR 100cc/hr, replete lytes prn, HH diet  VTE ppx: Lovenox  Code Status: FULL    Dispo: Admit patient to Inpatient with expected length of stay greater than 2 midnights.   Prior to Admission Living Arrangement: Anticipated Discharge Location: Barriers to Discharge: Dispo: Anticipated discharge in approximately 2-3 day(s).   Briant Cedar, MD 07/05/2019, 12:05 PM Pager: @MYPAGER @ After 5pm on weekdays and 1pm on weekends: On Call pager [phone]

## 2019-07-05 NOTE — Plan of Care (Signed)
  Problem: Education: Goal: Knowledge of disease or condition will improve Outcome: Progressing Goal: Knowledge of the prescribed therapeutic regimen will improve Outcome: Progressing Goal: Individualized Educational Video(s) Outcome: Progressing   Problem: Respiratory: Goal: Ability to maintain a clear airway will improve Outcome: Progressing Goal: Levels of oxygenation will improve Outcome: Progressing

## 2019-07-05 NOTE — Progress Notes (Signed)
Patient arrived on unit in no acute distress and on 2 L oxygen.  He is alert and oriented X 4.  Unit and room orientation, bath, assessment, and CHG bath completed.  Patient is in bed resting.  Will continue to monitor.

## 2019-07-05 NOTE — Progress Notes (Signed)
Informed by pt's RN that pt was observed using chewing tobacco in his room.  Went to pt's room, observed what appeared to be bits of chewing tobacco in the pt's bed sheets.  Informed pt of hospital's tobacco free environment policy, and that this policy included smokeless tobacco.  Pt indicated understanding, and agreed not to use chewing tobacco again.  Informed pt that we could request a nicotine patch from the physician, pt agreed to this, paged IMTS on-call provider who ordered transdermal nicotine patch.

## 2019-07-05 NOTE — Consult Note (Signed)
NAME:  James [name], MRN:  [id number], DOB:  [birth year], LOS: 1 ADMISSION DATE:  07/04/2019, CONSULTATION DATE:  7/1 REFERRING MD:  Philipp Ovens, CHIEF COMPLAINT:  Shortness of breath  Brief History   59 year old male patient with known history of COPD, has had multiple recent admissions for acute respiratory failure.  Readmitted once again with acute hypoxic respiratory failure and persistent pulmonary infiltrates pulmonary asked to evaluate.  History of present illness   This is a 59 year old male with history as recorded below.  Presented to the emergency room on 6/30 with chief complaint of 1 day history of shortness of breath.  He apparently woke up on 6/29 short of breath, he has had somewhat chronic congested cough with brown-colored mucus.  Denied fever, chills, chest pain, recent sick exposure.  Had just recently been discharged from the hospital after being admitted on 6/16 for what was called a COPD exacerbation and right lower lobe pneumonia, once again being readmitted.  Further history from his primary care provider raises concerns about outpatient/potential environmental exposures.  He apparently lived in a home with known standing water under his house.  There was visible mold in the house, in poorly functioning air conditioning ([place] reports this had all been repaired about 45 months ago.  He does have carpet says "its in good shape", has a Cat as a pet. Has had him for 4 months (these events started before he got his cat. He hasn't smoked since 2014, no longer works or has exposure to concerning dusts or fumes. No birds. He reports this event similar to last. Chronically has cough that is intermittently productive. Awoken w/ sudden shortness of breath. Of note he had run out of his Dulera. He has had several readmissions for recurrent respiratory distress/hypoxic respiratory failure presenting once again for the same.  He was admitted to the internal medicine service  initial diagnostic  evaluation demonstrates the following: CBC with white cell count of 34.7 pro calcitonin less than 0.10 coronavirus was negative D-dimer negative initial chest x-ray showed predominantly right sided airspace disease, this actually reflects an improvement when comparing prior film.  Pulmonary has been asked to evaluate given recurrent respiratory failure and persistent pulmonary infiltrates  -> Of note we saw him as an inpatient back in August 2020 for recurrent pulmonary infiltrates at that time as well.  He underwent bronchoscopy and inpatient pulmonary evaluation.  Work-up was notable for chronic bronchiectatic changes, with rapid resolution after systemic steroids primary concern being for inflammatory process.  He did undergo modified barium swallow at that time which was negative for evidence of aspiration Past Medical History  COPD, HTN, ETOH, recent RLL PNA.   Significant Hospital Events    Consults:    Procedures:    Significant Diagnostic Tests:  CT chest 7/1>>> Allergen profile: mold 7/1>>> IgE 7/1>>> HSP 7/1>>>  Micro Data:  Blood cultures 6/30  Antimicrobials:  Azithromycin 7/1>>>  Interim history/subjective:  Feels much better   Objective   Blood pressure (Abnormal) 142/85, pulse 96, temperature 98.1 F (36.7 C), temperature source Oral, resp. rate (Abnormal) 21, height 5' 9.5" (1.765 m), weight 83.9 kg, SpO2 94 %.        Intake/Output Summary (Last 24 hours) at 07/05/2019 1337 Last data filed at 07/05/2019 1200 Gross per 24 hour  Intake 6264.67 ml  Output 2500 ml  Net 3764.67 ml   Filed Weights   07/04/19 0700  Weight: 83.9 kg    Examination: General: Pleasant 59 year old white  male resting in bed no acute distress able to complete full sentences currently room air HENT: Normocephalic atraumatic and edentulous no JVD mucous membranes moist Lungs: Expiratory wheezing, right greater than left no accessory use Cardiovascular: Regular rate and rhythm Abdomen:  Soft not tender Extremities: No edema Neuro: Awake oriented GU: Voids spontaneously  Resolved Hospital Problem list     Assessment & Plan:  Acute exacerbation of chronic obstructive pulmonary disease with ongoing /persistent right sided pulmonary infiltrates -In comparison to previous chest x-ray On 6/16 his infiltrates actually look a little better -He has not had the environmental exposures he previously had for approximately 4 months now -He no longer smokes -Previous swallow evaluation was negative for aspiration, however he reports each time his symptoms have presented with acute onset waking him up.  He does endorse occasional reflux.  He does have heavy EtOH history.  He no longer has teeth..  Differential diagnosis is wide.  In addition to community-acquired pneumonia, which history does not seem to support would consider aspiration in the setting of reflux, or esophageal dysphagia which would be consistent with waking up acutely short of breath, also basilar bronchiectatic changes would support this based on preliminary read of CT scan.  He did just run out of his maintenance bronchodilators and with infiltrates actually better this could just be COPD exacerbation with resolving pneumonia, finally consider some form of pneumonitis, seeming a little less likely given environmental concerns addressed in the last 4 months -He feels much better.  Difficult to know if this is from the steroids, simply getting his bronchodilators, or the antibiotics.  Plan Continue scheduled bronchodilators As he is much better  azithromycin for COPD exacerbation reasonable, but this would not be ideal therapy for aspiration Continue current prednisone dosing Sending IgE, hypersensitivity pneumonitis panel, allergen profile for mold We will order an esophagram looking for stricture and evidence of esophageal dysphagia Follow-up CT scan   Best practice:  Per primary  Labs   CBC: Recent Labs  Lab  07/04/19 0702 07/05/19 0126  WBC 34.7* 21.7*  NEUTROABS 28.9*  --   HGB 15.0 12.5*  HCT 47.2 38.2*  MCV 103.3* 102.1*  PLT 359 947    Basic Metabolic Panel: Recent Labs  Lab 07/04/19 0702 07/05/19 0126  NA 143 140  K 4.3 4.1  CL 105 104  CO2 28 29  GLUCOSE 148* 122*  BUN 9 14  CREATININE 0.86 0.74  CALCIUM 9.2 8.9   GFR: Estimated Creatinine Clearance: 102.4 mL/min (by C-G formula based on SCr of 0.74 mg/dL). Recent Labs  Lab 07/04/19 0656 07/04/19 0702 07/04/19 1400 07/04/19 1939 07/04/19 2339 07/05/19 0126  PROCALCITON  --  <0.10  --   --   --   --   WBC  --  34.7*  --   --   --  21.7*  LATICACIDVEN 1.6  --  3.8* 2.2* 1.7  --     Liver Function Tests: Recent Labs  Lab 07/04/19 0702 07/05/19 0126  AST 22 13*  ALT 27 21  ALKPHOS 52 35*  BILITOT 0.4 0.8  PROT 6.5 5.2*  ALBUMIN 3.5 2.7*   No results for input(s): LIPASE, AMYLASE in the last 168 hours. No results for input(s): AMMONIA in the last 168 hours.  ABG    Component Value Date/Time   PHART 7.368 06/20/2019 0629   PCO2ART 48.6 (H) 06/20/2019 0629   PO2ART 135 (H) 06/20/2019 0629   HCO3 27.5 06/20/2019 0629   TCO2 29  06/20/2019 0629   O2SAT 99.0 06/20/2019 0629     Coagulation Profile: Recent Labs  Lab 07/04/19 0702  INR 0.9    Cardiac Enzymes: No results for input(s): CKTOTAL, CKMB, CKMBINDEX, TROPONINI in the last 168 hours.  HbA1C: Hgb A1c MFr Bld  Date/Time Value Ref Range Status  06/22/2019 03:13 AM 6.2 (H) 4.8 - 5.6 % Final    Comment:    (NOTE)         Prediabetes: 5.7 - 6.4         Diabetes: >6.4         Glycemic control for adults with diabetes: <7.0   06/20/2018 11:59 AM 6.3 (H) 4.8 - 5.6 % Final    Comment:             Prediabetes: 5.7 - 6.4          Diabetes: >6.4          Glycemic control for adults with diabetes: <7.0     CBG: Recent Labs  Lab 07/04/19 2338 07/05/19 0357 07/05/19 0750 07/05/19 1155 07/05/19 1257  GLUCAP 105* 115* 106* 414* 267*     Review of Systems:   Review of Systems  Constitutional: Negative for chills, fever, malaise/fatigue and weight loss.  HENT: Negative for congestion.   Eyes: Negative.   Respiratory: Positive for cough, sputum production, shortness of breath and wheezing.   Cardiovascular: Negative.   Gastrointestinal: Positive for heartburn.  Genitourinary: Negative.   Musculoskeletal: Negative.   Skin: Negative.   Neurological: Negative.   Endo/Heme/Allergies: Negative.   Psychiatric/Behavioral: Negative.     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   Surgical History    Past Surgical History:  Procedure Laterality Date  . VIDEO ASSISTED THORACOSCOPY (VATS)/THOROCOTOMY Left 06/11/2013   Procedure: VIDEO ASSISTED THORACOSCOPY (VATS)/THOROCOTOMY;  Surgeon: Ivin Poot, MD;  Location: [place];  Service: Thoracic;  Laterality: Left;  [name] VIDEO BRONCHOSCOPY N/A 06/11/2013   Procedure: VIDEO BRONCHOSCOPY;  Surgeon: Ivin Poot, MD;  Location: [place];  Service: Thoracic;  Laterality: N/A;  . VIDEO BRONCHOSCOPY N/A 08/10/2018   Procedure: VIDEO BRONCHOSCOPY WITH FLUORO;  Surgeon: Candee Furbish, MD;  [facility] ENDOSCOPY;  Service: Endoscopy;  Laterality: N/A;     Social History   reports that he quit smoking about 7 years ago. His smoking use included cigarettes. He has a 60.00 pack-year smoking history. His smokeless tobacco use includes chew. He reports current alcohol use of about 28.0 standard drinks of alcohol per week. He reports current drug use. Drug: Marijuana.   Family History   His family history includes COPD in his sister; Heart disease in his father.   Allergies Allergies   Allergen Reactions  . Gabapentin Other (See Comments)    hallucinations     Home Medications  Prior to Admission medications   Medication Sig Start Date End Date Taking? Authorizing Provider  albuterol (PROVENTIL) (2.5 MG/3ML) 0.083% nebulizer solution Take 3 mLs (2.5 mg total) by nebulization every 6 (six) hours as needed for wheezing or shortness of breath. 04/27/19  Yes Ghimire, Henreitta Leber, MD  albuterol (VENTOLIN HFA) 108 (90 Base) MCG/ACT inhaler Inhale 2 puffs into the lungs every 6 (six) hours as needed for wheezing or shortness of breath. 04/27/19  Yes  Ghimire, Henreitta Leber, MD  folic acid (FOLVITE) 1 MG tablet Take 1 tablet (1 mg total) by mouth daily. 04/27/19  Yes Ghimire, Henreitta Leber, MD  losartan (COZAAR) 100 MG tablet Take 1 tablet (100 mg total) by mouth daily. 07/02/19  Yes Mayers, Cari S, PA-C  predniSONE (DELTASONE) 20 MG tablet Take 2 tablets (40 mg total) by mouth daily for 15 days. 06/22/19 07/07/19 Yes Maudie Mercury, MD  Respiratory Therapy Supplies (FLUTTER) DEVI Use 4 times daily Patient taking differently: 1 each by Other route in the morning, at noon, in the evening, and at bedtime.  04/27/18  Yes [name], MD  Spacer/Aero-Holding Chambers (AEROCHAMBER MV) inhaler Use as instructed Patient taking differently: 1 each by Other route See admin instructions. Use as instructed 08/03/18  Yes [name], MD  tiotropium (SPIRIVA HANDIHALER) 18 MCG inhalation capsule Place 1 capsule (18 mcg total) into inhaler and inhale daily. 04/27/19 07/26/19 Yes Ghimire, Henreitta Leber, MD  mometasone-formoterol (DULERA) 200-5 MCG/ACT AERO Inhale 2 puffs into the lungs 2 (two) times daily. 04/27/19   Ghimire, Henreitta Leber, MD  thiamine (VITAMIN B-1) 100 MG tablet Take 1 tablet (100 mg total) by mouth daily. 08/03/18   [name], MD     Critical care time: not applicable     Erick Colace ACNP-BC [place] Pager # [phone] OR # [phone] if no answer

## 2019-07-06 ENCOUNTER — Inpatient Hospital Stay (HOSPITAL_COMMUNITY): Payer: Medicaid Other

## 2019-07-06 LAB — CBC
HCT: 40.5 % (ref 39.0–52.0)
Hemoglobin: 13.2 g/dL (ref 13.0–17.0)
MCH: 33 pg (ref 26.0–34.0)
MCHC: 32.6 g/dL (ref 30.0–36.0)
MCV: 101.3 fL — ABNORMAL HIGH (ref 80.0–100.0)
Platelets: 245 10*3/uL (ref 150–400)
RBC: 4 MIL/uL — ABNORMAL LOW (ref 4.22–5.81)
RDW: 12.4 % (ref 11.5–15.5)
WBC: 15.2 10*3/uL — ABNORMAL HIGH (ref 4.0–10.5)
nRBC: 0 % (ref 0.0–0.2)

## 2019-07-06 LAB — GLUCOSE, CAPILLARY
Glucose-Capillary: 112 mg/dL — ABNORMAL HIGH (ref 70–99)
Glucose-Capillary: 194 mg/dL — ABNORMAL HIGH (ref 70–99)
Glucose-Capillary: 172 mg/dL — ABNORMAL HIGH (ref 70–99)
Glucose-Capillary: 155 mg/dL — ABNORMAL HIGH (ref 70–99)

## 2019-07-06 MED ORDER — PNEUMOCOCCAL VAC POLYVALENT 25 MCG/0.5ML IJ INJ
0.5000 mL | INJECTION | INTRAMUSCULAR | Status: DC
  Filled 2019-07-06: qty 0.5

## 2019-07-06 MED ORDER — IPRATROPIUM-ALBUTEROL 0.5-2.5 (3) MG/3ML IN SOLN: 3.0000 mL | Freq: Four times a day (QID) | RESPIRATORY_TRACT | Status: DC | PRN

## 2019-07-06 MED ORDER — PANTOPRAZOLE SODIUM 40 MG PO TBEC
40.0000 mg | DELAYED_RELEASE_TABLET | Freq: Every day | ORAL | Status: DC
  Administered 2019-07-06 – 2019-07-07 (×2): 40 mg via ORAL
  Filled 2019-07-06 (×2): qty 1

## 2019-07-06 NOTE — Progress Notes (Signed)
This RN rounded on patient and found him with his bag which he hurriedly placed at the side of his bed.  RN asked patient if he was okay to which patient responded "I am just fine".  RN noticed some black crumbs all over patient and asked patient if he was chewing tobacco and he was speechless.  RN informed patient about our tobacco free campus and told patient that it will be locked up and patient refused.  Charge RN was notified and he spoke with patient reinforcing the no tobacco policy.  MD notified and an order for nicotine patch received and applied.  He is in bed resting.  Will continue to monitor.

## 2019-07-06 NOTE — Discharge Summary (Signed)
Name: James [name] MRN: [id number] DOB: 1960/11/25 59 y.o. PCP: [name], MD  Date of Admission: 07/04/2019  6:52 AM Date of Discharge: 07/07/2019 Attending Physician: Velna Ochs, MD  Discharge Diagnosis: 1. Acute hypoxic respiratory 2/2 COPD exacerbation vs pneumonitis 2. GERD 3. EtOH history 4. HTN  Discharge Medications: Allergies as of 07/07/2019      Reactions   Gabapentin Other (See Comments)   hallucinations      Medication List    TAKE these medications   AeroChamber MV inhaler Use as instructed What changed:   how much to take  how to take this  when to take this   albuterol 108 (90 Base) MCG/ACT inhaler Commonly known as: VENTOLIN HFA Inhale 2 puffs into the lungs every 6 (six) hours as needed for wheezing or shortness of breath.   albuterol (2.5 MG/3ML) 0.083% nebulizer solution Commonly known as: PROVENTIL Take 3 mLs (2.5 mg total) by nebulization every 6 (six) hours as needed for wheezing or shortness of breath.   azithromycin 250 MG tablet Commonly known as: ZITHROMAX Take 1 tablet (250 mg total) by mouth daily for 1 day.   Flutter Devi Use 4 times daily What changed:   how much to take  how to take this  when to take this  additional instructions   folic acid 1 MG tablet Commonly known as: FOLVITE Take 1 tablet (1 mg total) by mouth daily.   losartan 100 MG tablet Commonly known as: COZAAR Take 1 tablet (100 mg total) by mouth daily.   mometasone-formoterol 200-5 MCG/ACT Aero Commonly known as: DULERA Inhale 2 puffs into the lungs 2 (two) times daily.   pantoprazole 40 MG tablet Commonly known as: PROTONIX Take 1 tablet (40 mg total) by mouth daily.   predniSONE 20 MG tablet Commonly known as: DELTASONE Take 2 tablets (40 mg total) by mouth daily with breakfast. What changed: when to take this   Spiriva HandiHaler 18 MCG inhalation capsule Generic drug: tiotropium Place 1 capsule (18 mcg total) into  inhaler and inhale daily.   thiamine 100 MG tablet Commonly known as: Vitamin B-1 Take 1 tablet (100 mg total) by mouth daily.       Disposition and follow-up:   James [name] was discharged from [facility] in Stable condition.  At the hospital follow up visit please address:  1.  COPD exacerbation: Continue daily Albuterol, Dulera, Spiriva, and prednisone 40 mg daily. Sent home with 1 additional day of azithromycin. Discharged on 2L [place]. F/u allergy labs.  GERD: Severe reflux noted on esophagram, started on pantoprazole daily, may need GI follow up.   2.  Labs / imaging needed at time of follow-up: None  3.  Pending labs/ test needing follow-up: IgE, allergen panel for mold, hypersensitivity pneumonitis  Follow-up Appointments:  Follow-up Information    [name], MD. Schedule an appointment as soon as possible for a visit in 1 week(s).   Specialty: Pulmonary Disease Contact information: [address] [place] [address] [phone]               Hospital Course by problem list: 1. Acute hypoxic respiratory 2/2 COPD exacerbation vs pneumonitis: This is a 59 y.o. yo male w/ PMH significant for COPD, hypertension, alcohol use disorder who was recently admitted from 06/19/16 for COPD exacerbation and RLL pneumonia who presented with worsening SOB and cough. Noted to have acute hypoxic respiratory distress. Noted to have ran out of Spiriva  a few days before. He was noted to be febrile to 101.8, tachycardic and tachypneic. WBC elevated, felt to be due to steroid use. CXR showed improvement in his pneumonia. COVID negative. LA normal. Given 1 dose of vanc and zosyn in the ER. Obtained a D-dimer which was normal. He was treated with duonebs, azithromycin, spiriva, dulera, and prednisone with improvement. Initially needed to be on BiPAP however was weaned down to [place]. PCP saw in hospital, recommended pulm consult, continuing prednisone 40 mg daily on  discharge, non-contrast CT, mold allergy assay and IgE level, and may need home O2, will follow up outpatient. Consulted pulmonology. Obtained esophagram that showed severe reflux up to level of cervical esophagus, no stricture. Started on protonix daily. High res CT showed no evidence of ILD. Overall patient improved during hospitalization however still required O2 per ambulatory pulse ox. Patient provided a sample of Spiriva from IMTS clinic to use until he is able to pick up his refills. Patient discharged on Spiriva, Dulera, prednisone, 1 more day of azithromycin, and albuterol. Discharged home with O2.   2. GERD: Esophagram obtained showed severe reflux up to level of cervical esophagus, no stricture. Started on protonix daily. Patient may need follow up with gastroenterology.   3. EtOH history: Drinks 6 beers daily, placed on CIWA with ativan and given fluids, multivitamins, and thiamine. Encouraged cessation.   4. HTN: Continued losartan on discharge.   Discharge Vitals:   BP (!) 147/102   Pulse 92   Temp 98.4 F (36.9 C) (Oral)   Resp (!) 22   Ht 5' 9.5" (1.765 m)   Wt 78.5 kg   SpO2 96%   BMI 25.19 kg/m   Pertinent Labs, Studies, and Procedures:  CMP Latest Ref Rng & Units 07/05/2019 07/04/2019 06/22/2019  Glucose 70 - 99 mg/dL 122(H) 148(H) 122(H)  BUN 6 - 20 mg/dL 14 9 15   Creatinine 0.61 - 1.24 mg/dL 0.74 0.86 0.70  Sodium 135 - 145 mmol/L 140 143 140  Potassium 3.5 - 5.1 mmol/L 4.1 4.3 3.9  Chloride 98 - 111 mmol/L 104 105 104  CO2 22 - 32 mmol/L 29 28 26   Calcium 8.9 - 10.3 mg/dL 8.9 9.2 9.3  Total Protein 6.5 - 8.1 g/dL 5.2(L) 6.5 -  Total Bilirubin 0.3 - 1.2 mg/dL 0.8 0.4 -  Alkaline Phos 38 - 126 U/L 35(L) 52 -  AST 15 - 41 U/L 13(L) 22 -  ALT 0 - 44 U/L 21 27 -   CBC Latest Ref Rng & Units 07/07/2019 07/06/2019 07/05/2019  WBC 4.0 - 10.5 K/uL 15.0(H) 15.2(H) 21.7(H)  Hemoglobin 13.0 - 17.0 g/dL 14.6 13.2 12.5(L)  Hematocrit 39 - 52 % 44.0 40.5 38.2(L)  Platelets 150 -  400 K/uL 301 245 241   Esophageal Study: Severe gastroesophageal reflux to the level of the cervical Esophagus but no esophageal stricture.   CT chest w/out contrast: IMPRESSION: 1. No findings to suggest interstitial lung disease. 2. Multilobar bronchopneumonia in the right lung very similar to prior study from 08/09/2018. No centrally obstructing lesion is identified on today's examination. 3. Diffuse bronchial wall thickening with moderate centrilobular and paraseptal emphysema; imaging findings suggestive of underlying COPD. 4. Aortic atherosclerosis.  Discharge Instructions: Discharge Instructions    AMB Referral to [facility] Coordinaton   Complete by: As directed    Reason for Referral:  Chronic Care Management (CCM) billable service Care Coordination (Managed Medicaid)     MM Services needed: Pharmacy   Reason for Consult:  Medication Adherence Comment - Ensure he is taking his COPD medications Medication Assistance Medication Management Polypharmacy     Expected date of contact: Emergent - 3 Days   Diet - low sodium heart healthy   Complete by: As directed    Diet - low sodium heart healthy   Complete by: As directed    Increase activity slowly   Complete by: As directed    Increase activity slowly   Complete by: As directed       Signed: Asencion Noble, MD 07/08/2019, 4:50 PM   Pager: @[phone] @ \

## 2019-07-06 NOTE — Progress Notes (Signed)
O2 sat 87% RA on ambulation O2 sat 94% 2 liters via [place] on ambulation O2 sat 96% 2 liters via [place] at rest

## 2019-07-06 NOTE — Progress Notes (Signed)
NAME:  James [name], MRN:  [id number], DOB:  Aug 08, 1960, LOS: 2 ADMISSION DATE:  07/04/2019, CONSULTATION DATE:  7/1 REFERRING MD:  Philipp Ovens, CHIEF COMPLAINT:  Shortness of breath  Brief History   59 year old male patient with known history of COPD, has had multiple recent admissions for acute respiratory failure.  Readmitted once again with acute hypoxic respiratory failure and persistent pulmonary infiltrates pulmonary asked to evaluate.   Past Medical History  COPD, HTN, ETOH, recent RLL PNA.   Significant Hospital Events     Consults:    Procedures:    Significant Diagnostic Tests:  HRCT chest 7/1 > No findings to suggest interstitial lung disease. Multilobar bronchopneumonia in the right lung. Diffuse bronchial wall thickening with moderate centrilobular and paraseptal emphysema.   Allergen profile: mold 7/1>>> IgE 7/1>>> HSP 7/1>>>  Micro Data:  Blood cultures 6/30 >>>  Antimicrobials:  Azithromycin 7/1>>>  Interim history/subjective:  Feeling better each day. On 2L  Objective   Blood pressure (!) 143/90, pulse 84, temperature 97.8 F (36.6 C), temperature source Axillary, resp. rate (!) 24, height 5' 9.5" (1.765 m), weight 78.5 kg, SpO2 95 %.        Intake/Output Summary (Last 24 hours) at 07/06/2019 1050 Last data filed at 07/06/2019 0701 Gross per 24 hour  Intake 240 ml  Output 3277 ml  Net -3037 ml   Filed Weights   07/04/19 0700 07/06/19 0403  Weight: 83.9 kg 78.5 kg    Examination: General: Middle aged male in NAD HENT: [place]/AT, PERRL, no JVD Lungs: Clear bilateral breath sounds Cardiovascular: Regular rate and rhythm Abdomen: Soft not tender non-distended Extremities: No edema.  Neuro: alert, oriented, non-focal  Resolved Hospital Problem list     Assessment & Plan:  Acute exacerbation of chronic obstructive pulmonary disease  - Continue scheduled bronchodilators - Azithromycin for COPD exacerbation reasonable, but this would not be  ideal for aspiration - Steroids per primary. [name] has requested we not taper below 40 mg prednisone daily and plan to discharge on this.   Persistent right sided pulmonary infiltrates: differential remains broad. Please see consult note for more detailed assessment. Differential includes ILD with recent environmental exposures and smoking history, chornic aspiration despite previous negative evaluation considering he has reflux issues and heavy ETOH history, and CAP. All of this on the baseline COPD.    CT 7/1 shows multilobar right sided pneumonia c/w aspiration.   - IgE, hypersensitivity pneumonitis panel, allergen profile for mold pending - Supplemental O2 as indicated.  - Awaiting esophogram results today    Best practice:  Per primary  Labs   CBC: Recent Labs  Lab 07/04/19 0702 07/05/19 0126 07/06/19 0609  WBC 34.7* 21.7* 15.2*  NEUTROABS 28.9*  --   --   HGB 15.0 12.5* 13.2  HCT 47.2 38.2* 40.5  MCV 103.3* 102.1* 101.3*  PLT 359 241 734    Basic Metabolic Panel: Recent Labs  Lab 07/04/19 0702 07/05/19 0126  NA 143 140  K 4.3 4.1  CL 105 104  CO2 28 29  GLUCOSE 148* 122*  BUN 9 14  CREATININE 0.86 0.74  CALCIUM 9.2 8.9   GFR: Estimated Creatinine Clearance: 102.4 mL/min (by C-G formula based on SCr of 0.74 mg/dL). Recent Labs  Lab 07/04/19 0656 07/04/19 0702 07/04/19 1400 07/04/19 1939 07/04/19 2339 07/05/19 0126 07/06/19 0609  PROCALCITON  --  <0.10  --   --   --   --   --   WBC  --  34.7*  --   --   --  21.7* 15.2*  LATICACIDVEN 1.6  --  3.8* 2.2* 1.7  --   --     Liver Function Tests: Recent Labs  Lab 07/04/19 0702 07/05/19 0126  AST 22 13*  ALT 27 21  ALKPHOS 52 35*  BILITOT 0.4 0.8  PROT 6.5 5.2*  ALBUMIN 3.5 2.7*   No results for input(s): LIPASE, AMYLASE in the last 168 hours. No results for input(s): AMMONIA in the last 168 hours.  ABG    Component Value Date/Time   PHART 7.368 06/20/2019 0629   PCO2ART 48.6 (H)  06/20/2019 0629   PO2ART 135 (H) 06/20/2019 0629   HCO3 27.5 06/20/2019 0629   TCO2 29 06/20/2019 0629   O2SAT 99.0 06/20/2019 0629     Coagulation Profile: Recent Labs  Lab 07/04/19 0702  INR 0.9    Cardiac Enzymes: No results for input(s): CKTOTAL, CKMB, CKMBINDEX, TROPONINI in the last 168 hours.  HbA1C: Hgb A1c MFr Bld  Date/Time Value Ref Range Status  06/22/2019 03:13 AM 6.2 (H) 4.8 - 5.6 % Final    Comment:    (NOTE)         Prediabetes: 5.7 - 6.4         Diabetes: >6.4         Glycemic control for adults with diabetes: <7.0   06/20/2018 11:59 AM 6.3 (H) 4.8 - 5.6 % Final    Comment:             Prediabetes: 5.7 - 6.4          Diabetes: >6.4          Glycemic control for adults with diabetes: <7.0     CBG: Recent Labs  Lab 07/05/19 1257 07/05/19 1542 07/05/19 1945 07/05/19 2330 07/06/19 0752  GLUCAP 267* 269* 125* 145* 112*      Georgann Housekeeper, AGACNP-BC [place]  See Amion for personal pager PCCM on call pager [phone]  07/06/2019 10:50 AM

## 2019-07-06 NOTE — TOC Progression Note (Addendum)
Transition of Care [facility]) - Progression Note    Patient Details  Name: KAYLUB DETIENNE MRN: [id number] Date of Birth: 07-13-1960  Transition of Care [facility]) CM/SW [place], RN Phone Number: 07/06/2019, 5:54 PM  Clinical Narrative:     Patient will likely qualify for oxygen  Ran through qualifications with RN this AM. Done this afternoon, missed resting o2 saturation. Sent RN instructions this PM on where to find note where automatically populated. . Sister is coming from  3 hours away tomorrow will be with patient as he recovers. ? Unsure if they are going to his house or hers. Need O2 ordered before discharge DME just need resting RA sat%  Expected Discharge Plan: Home/Self Care Barriers to Discharge: Continued Medical Work up  Expected Discharge Plan and Services Expected Discharge Plan: Home/Self Care        Oxygen DME                                       Social Determinants of Health (SDOH) Interventions    Readmission Risk Interventions Readmission Risk Prevention Plan 04/26/2019  Transportation Screening Complete  PCP or Specialist Appt within 3-5 Days Complete  HRI or [place] Complete  Social Work Consult for [place] Planning/Counseling Complete  Palliative Care Screening Not Applicable  Medication Review Press photographer) Complete  Some recent data might be hidden

## 2019-07-06 NOTE — Plan of Care (Signed)
°  Problem: Education: Goal: Knowledge of disease or condition will improve Outcome: Progressing Goal: Knowledge of the prescribed therapeutic regimen will improve Outcome: Progressing Goal: Individualized Educational Video(s) Outcome: Progressing   Problem: Activity: Goal: Ability to tolerate increased activity will improve Outcome: Progressing   Problem: Respiratory: Goal: Ability to maintain a clear airway will improve Outcome: Progressing Goal: Levels of oxygenation will improve Outcome: Progressing

## 2019-07-06 NOTE — Progress Notes (Addendum)
Subjective: Examined James [name] at bedside. Patient lying in bed comfortably. States that he is feeling well. Asking about discharge.  Able to sit up on his own and states that he is not having difficulty breathing. Patient desaturates to 88-89% while talking with [place].  Objective:  Vital signs in last 24 hours: Vitals:   07/05/19 1948 07/05/19 2333 07/06/19 0227 07/06/19 0403  BP: 136/87 (!) 141/82  (!) 145/90  Pulse: 82 99  84  Resp: (!) 25 16  19   Temp: 98.5 F (36.9 C) 98 F (36.7 C)  97.8 F (36.6 C)  TempSrc: Oral Oral  Axillary  SpO2: 98% 95% 97% 92%  Weight:    78.5 kg  Height:       Physical Exam Vitals and nursing note reviewed.  Constitutional:      General: He is not in acute distress.    Appearance: Normal appearance. He is not ill-appearing.  HENT:     Head: Normocephalic and atraumatic.  Eyes:     Extraocular Movements: Extraocular movements intact.  Cardiovascular:     Rate and Rhythm: Normal rate and regular rhythm.     Pulses: Normal pulses.          Radial pulses are 2+ on the right side and 2+ on the left side.       Dorsalis pedis pulses are 2+ on the right side and 2+ on the left side.     Heart sounds: Normal heart sounds, S1 normal and S2 normal. No murmur heard.   Pulmonary:     Effort: Pulmonary effort is normal. Tachypnea present. No respiratory distress.     Breath sounds: Wheezing present.     Comments: Reduced wheezing from previous exam, heard throughout all lung fields Abdominal:     General: Abdomen is flat. There is no distension.     Palpations: Abdomen is soft. There is no mass.  Musculoskeletal:        General: No swelling.     Right lower leg: No edema.     Left lower leg: No edema.  Neurological:     Mental Status: He is alert. Mental status is at baseline.  Psychiatric:        Mood and Affect: Mood normal.        Behavior: Behavior normal.      Assessment/Plan: Mr. Haff is a 59 yr old male with a PMHx  of COPD, HTN,  EtOH use, intellectual disability, and recent hospitalization for pneumonia (discharged 6/18). He was admitted for COPD exacerbation.   Active Problems:   COPD exacerbation (HCC)   Sepsis ([place])  COPD exacerbation: Patient responding to Duonebs, Azithromycin and prednisone. Currently on room air but saturating 89-90% at rest. -Appreciate pulmonology recommendations -Ambulatory pulse ox today to assess for home O2 need -continue scheduled Duonebs and Dulera -continue Azithromycin (day 3 of 5) -continue Prednisone 40 mg daily - will be discharged on this per PCP -IgE, hypersensitivity pneumonitis panel, and allergen profile for mold pending per pulm  -Esophagram showed severe GERD which could certainly be playing a role in his exacerbations. Will start protonix 40 mg daily  - High res CT chest without evidence of ILD   GERD: Esophogram showed severe reflux to the level of the cervical esophagus. Start protonix 40 mg daily. EtOH: - Continue CIWA evaluation and ativan as needed per protocol   Intellectual disability: Per prior notes, lives alone and he is illiterate. Unclear if he has been taking his medications.  FEN: No fluids, replete lytes prn, HH diet  VTE ppx: Lovenox  Code Status: FULL    Prior to Admission Living Arrangement: Anticipated Discharge Location: Barriers to Discharge: Dispo: Anticipated discharge today or tomorrow pending pulm recommendations.   Briant Cedar, MD 07/06/2019, 6:47 AM Pager: [phone] After 5pm on weekdays and 1pm on weekends: On Call pager [phone]

## 2019-07-07 LAB — GLUCOSE, CAPILLARY
Glucose-Capillary: 112 mg/dL — ABNORMAL HIGH (ref 70–99)
Glucose-Capillary: 158 mg/dL — ABNORMAL HIGH (ref 70–99)

## 2019-07-07 LAB — CBC
HCT: 44 % (ref 39.0–52.0)
Hemoglobin: 14.6 g/dL (ref 13.0–17.0)
MCH: 33.1 pg (ref 26.0–34.0)
MCHC: 33.2 g/dL (ref 30.0–36.0)
MCV: 99.8 fL (ref 80.0–100.0)
Platelets: 301 10*3/uL (ref 150–400)
RBC: 4.41 MIL/uL (ref 4.22–5.81)
RDW: 12.2 % (ref 11.5–15.5)
WBC: 15 10*3/uL — ABNORMAL HIGH (ref 4.0–10.5)
nRBC: 0 % (ref 0.0–0.2)

## 2019-07-07 MED ORDER — ALBUTEROL SULFATE HFA 108 (90 BASE) MCG/ACT IN AERS: 2.0000 | INHALATION_SPRAY | Freq: Four times a day (QID) | RESPIRATORY_TRACT | 0 refills | Status: DC | PRN

## 2019-07-07 MED ORDER — ALBUTEROL SULFATE (2.5 MG/3ML) 0.083% IN NEBU: 2.5000 mg | INHALATION_SOLUTION | Freq: Four times a day (QID) | RESPIRATORY_TRACT | 0 refills | Status: DC | PRN

## 2019-07-07 MED ORDER — MOMETASONE FURO-FORMOTEROL FUM 200-5 MCG/ACT IN AERO: 2.0000 | INHALATION_SPRAY | Freq: Two times a day (BID) | RESPIRATORY_TRACT | 0 refills | Status: DC

## 2019-07-07 MED ORDER — AZITHROMYCIN 250 MG PO TABS: 250.0000 mg | ORAL_TABLET | Freq: Every day | ORAL | 0 refills | Status: AC

## 2019-07-07 MED ORDER — SPIRIVA HANDIHALER 18 MCG IN CAPS: 18.0000 ug | ORAL_CAPSULE | Freq: Every day | RESPIRATORY_TRACT | 0 refills | Status: DC

## 2019-07-07 MED ORDER — PREDNISONE 20 MG PO TABS: 40.0000 mg | ORAL_TABLET | Freq: Every day | ORAL | 0 refills | Status: DC

## 2019-07-07 MED ORDER — PANTOPRAZOLE SODIUM 40 MG PO TBEC: 40.0000 mg | DELAYED_RELEASE_TABLET | Freq: Every day | ORAL | 0 refills | Status: DC

## 2019-07-07 NOTE — Progress Notes (Signed)
James [name] to be D/C'd home with sister per MD order. Discussed with the patient and all questions fully answered. Called sister, James [name], but she did not answer. Left message for her to call to go over discharge instructions. VVS, Skin clean, dry and intact without evidence of skin break down, no evidence of skin tears noted.  IV catheter discontinued intact. Site without signs and symptoms of complications. Dressing and pressure applied.  An After Visit Summary was printed and given to the patient.  Patient escorted via [place], and D/C home via private auto.  James [name]  07/07/2019

## 2019-07-07 NOTE — Progress Notes (Signed)
   Subjective: Examined patient at bedside. Sitting upright in bed not in any acute distress. Mr. Cervi states that he feels good and his breathing is better. Ready to go home.  Does not remember what he discussed with the pulmonologists. Discussed management plan and plan for outpatient follow up with pulm and patient states he understands.  Objective:  Vital signs in last 24 hours: Vitals:   07/06/19 2048 07/06/19 2311 07/07/19 0242 07/07/19 0351  BP:  133/86 138/90 (!) 136/91  Pulse:  84 89 77  Resp:  20  20  Temp:  97.8 F (36.6 C)  97.9 F (36.6 C)  TempSrc:  Oral  Oral  SpO2: 95% 91%  90%  Weight:      Height:       General: Middle-aged male, no acute distress, lying in bed, nasal cannula in place Cardiac: Regular rate and rhythm, no murmurs rubs or gallops Pulmonary: Decreased breath sounds throughout, no wheezing or rhonchi noted Abdomen: Soft, nontender, non distended  Assessment/Plan:  Active Problems:   COPD exacerbation (HCC)   Sepsis [facility])  Mr. Hinze is a 59 yr old male with a PMHx of COPD, HTN, EtOH use, intellectual disability, and recent hospitalization for COPD exacerbation and pneumonia (discharged 6/18). He was admitted for acute hypoxic respiratory failure 2/2 COPD exacerbation.  COPD exacerbation: Responding well to duonebs, azithromycin, prednisone, and dulera. Likely 2/2 running out of his home medications a few days ago. He is now on 2L [place] now. This has been stable while he has been here.  Pulmonology is seeing him. Obtained a modified barium swallow that showed severe GERD up to the level of the cervical esophagus. CT chest showed no evidence of ILD.  Have obtained IgE and HSP panel which are now pending.  Patient may need a gastroenterology evaluation/follow-up.  -Appreciate pulmonology recommendations > if patient looked okay then he can follow up with his outpatient pulmonologist -continuescheduled Duonebs and Dulera -continue Azithromycin (day  4 of 5) -continue Prednisone40 mg daily - will be discharged on this per PCP -IgE, hypersensitivity pneumonitis panel, and allergen profile for mold pending per pulm  -Esophagram showed severe GERD which could certainly be playing a role in his exacerbations. Will continue protonix 40 mg daily on discharge -DME order for oxygen placed -Plan for discharge today, will refill his home inhalers -F/u with PCP in 1 week  GERD: Esophogram showed severe reflux to the level of the cervical esophagus. Continue protonix 40 mg daily.  EtOH: - Continue CIWA evaluation and ativan as needed per protocol, d/c on discharge  Intellectual disability: Per prior notes, lives alone and he is illiterate. Unclear if he has been taking his medications.Consulted community care coordination per outpatient primary care providers request.  Prior to Admission Living Arrangement: Home Anticipated Discharge Location: Home Barriers to Discharge: None Dispo: Anticipated discharge in approximately today(s).   Asencion Noble, MD 07/07/2019, 6:19 AM Pager: [phone] After 5pm on weekdays and 1pm on weekends: On Call pager [phone]

## 2019-07-07 NOTE — Care Management (Signed)
Spoke w resident, requested DME O2 order asap to facilitate set up and discharge if the plan is for this today. Will continue to follow Carles Collet RN CM

## 2019-07-07 NOTE — Discharge Instructions (Signed)
James [name],   It has been a pleasure working with you and we are glad you're feeling better. You were hospitalized for a COPD exacerbation, this may have been due to you running out of your medications. You were treated with antibiotics and breathing treatments. You were still requiring oxygen so you were sent home with oxygen. The pulmonologist evaluated you and are looking into other causes for your breathing issues. You can follow up on these test with your primary care provider.   Please make the following changes: -Take the azithromycin for 1 more day -Continue taking the prednisone 40 mg daily -Continue taking the albuterol, dulera, and spiriva (we provided you with a different type of spiriva today that you can start taking tomorrow, take 2 puffs tomorrow morning, you can resume your home Spiriva when you get the refills from the pharmacy) -Start taking pantoprazole 40 mg daily  Follow up with your primary care provider in 1 weeks  If your symptoms worsen or you develop new symptoms, please seek medical help whether it is your primary care provider or emergency department.

## 2019-07-07 NOTE — TOC Transition Note (Signed)
Transition of Care [facility]) - CM/SW Discharge Note   Patient Details  Name: James [name] MRN: [id number] Date of Birth: [birth year]  Transition of Care [facility]) CM/SW Contact:  Carles Collet, RN Phone Number: 07/07/2019, 11:58 AM   Clinical Narrative:    Damaris Schooner w patient at bedside, declined need for [facility] or ambulatory DME. Will have his sister provide transport home. DME home oxygen ordered through [employer]. Portable tank to be delivered to the room for transportation home. No other CM needs.     Final next level of care: Home/Self Care Barriers to Discharge: Continued Medical Work up   Patient Goals and CMS Choice        Discharge Placement                       Discharge Plan and Services                DME Arranged: Oxygen DME Agency: [employer] Date DME Agency Contacted: 07/07/19 Time DME Agency Contacted: [id number] Representative spoke with at DME Agency: [place] ([place]) Interventions     Readmission Risk Interventions Readmission Risk Prevention Plan 04/26/2019  Transportation Screening Complete  PCP or Specialist Appt within 3-5 Days Complete  HRI or [place] Complete  Social Work Consult for [place] Planning/Counseling Complete  Palliative Care Screening Not Applicable  Medication Review Press photographer) Complete  Some recent data might be hidden

## 2019-07-07 NOTE — Progress Notes (Signed)
Called to patients room @ 0245. C/o anxious and "just can't get to sleep". CIWA=11 for moderate anxiety, mild sweating and tremors. Medicated with Ativan 2mg  IV.

## 2019-07-09 LAB — CULTURE, BLOOD (ROUTINE X 2)
Culture: NO GROWTH
Culture: NO GROWTH
Special Requests: ADEQUATE
Special Requests: ADEQUATE

## 2019-07-10 ENCOUNTER — Telehealth (HOSPITAL_COMMUNITY): Payer: Self-pay

## 2019-07-10 LAB — HYPERSENSITIVITY PNEUMONITIS
A. Pullulans Abs: NEGATIVE
A.Fumigatus #1 Abs: NEGATIVE
Micropolyspora faeni, IgG: NEGATIVE
Pigeon Serum Abs: NEGATIVE
Thermoact. Saccharii: NEGATIVE
Thermoactinomyces vulgaris, IgG: NEGATIVE

## 2019-07-10 MED FILL — PANTOPRAZOLE SOD DR 40 MG T: 40 | 30 days supply | Qty: 30 | Fill #0

## 2019-07-10 MED FILL — predniSONE 20 MG TABS: 20 | 30 days supply | Qty: 60 | Fill #0

## 2019-07-10 MED FILL — DULERA 200 MCG/5 MCG INH: 200-5 | 30 days supply | Qty: 13 | Fill #0

## 2019-07-10 MED FILL — AZITHROMYCIN 250 MG TABLET: 250 | 1 days supply | Qty: 1 | Fill #0

## 2019-07-10 MED FILL — ALBUTEROL SUL 2.5 MG/3 ML S: (2.5 MG/3ML | 12 days supply | Qty: 150 | Fill #0

## 2019-07-10 MED FILL — PROAIR HFA 90 MCG INHALER: 108 (90 BAS | 25 days supply | Qty: 9 | Fill #0

## 2019-07-11 ENCOUNTER — Telehealth: Payer: Self-pay

## 2019-07-11 NOTE — Telephone Encounter (Signed)
Called pt about post d/c f/u home visit. [place]. He states he is feeling much better and doing well. I will f/u with him next week once he returns home. Pt does report having his 02 with him as well.   [name], EMT-Paramedic  07/11/19

## 2019-07-11 NOTE — Telephone Encounter (Signed)
Transition Care Management Follow-up Telephone Call   This call was  completed with patient's sister, [name] Call also placed to his sister [name], message left with call back requested to this CM  Date of discharge and from where: 07/07/2019, [facility]   How have you been since you were released from the hospital? Per his sister [name], he is doing real well and sounds wonderful. he is currently staying with his other sister. [name],  about 3 hours away. He may be home in about a week.   Any questions or concerns?  [name] said that [name] is trying to assist him with obtaining an ID. He has already received disability checks  Items Reviewed:  Did the pt receive and understand the discharge instructions provided? [name] said that he has the instructions  Medications obtained and verified?  theresa also stated that he has all of his medications.   Any new allergies since your discharge?  none reported  Do you have support at home? he is currently staying with his sister, [name]   He receives services from the community paramedicine program.  He now has home O2 through [employer].  [name] said that he has a portable O2 concentrator with him. He was also supplied with a back up tank.   [name] said that she bought an oximeter.   Functional Questionnaire: (I = Independent and D = Dependent) ADLs: independent.   His sisters assist with medication management   Follow up appointments reviewed:   PCP Hospital f/u appt confirmed? [name] - 07/23/2019  Specialist Hospital f/u appt confirmed? None scheduled at this time   Are transportation arrangements needed?  no  If their condition worsens, is the pt aware to call PCP or go to the Emergency Dept.?  yes  Was the patient provided with contact information for the PCP's office or ED? his sisters have the phone number for the clinic  Was to pt encouraged to call back with questions or concerns?  his sister has been encouraged to  call with questions.

## 2019-07-13 NOTE — Addendum Note (Signed)
Addended by: Hinton Dyer on: 07/13/2019 04:09 PM   Modules accepted: Orders

## 2019-07-14 LAB — MISC LABCORP TEST (SEND OUT): Labcorp test code: 62448

## 2019-07-14 LAB — IGE: IgE (Immunoglobulin E), Serum: 228 IU/mL (ref 6–495)

## 2019-07-18 ENCOUNTER — Other Ambulatory Visit (HOSPITAL_COMMUNITY): Payer: Self-pay

## 2019-07-18 ENCOUNTER — Ambulatory Visit: Payer: Medicaid Other | Admitting: Critical Care Medicine

## 2019-07-18 NOTE — Progress Notes (Signed)
Paramedicine Encounter    Patient ID: SILVANO GAROFANO, male    DOB: 1960/07/07, 59 y.o.   MRN: [id number]   Patient Care Team: [name], MD as PCP - General (Pulmonary Disease) O'Neal, Cassie Freer, MD as PCP - Cardiology (Cardiology) Serena Colonel, RN as [facility]  Patient Active Problem List   Diagnosis Date Noted  . Acute hypoxemic respiratory failure ([place])   . Other atopic dermatitis 05/01/2019  . Acute on chronic respiratory failure with hypoxia and hypercapnia ([place]) 04/23/2019  . Intellectual disability 04/23/2019  . 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 Sepsis ([place]) 09/12/2018  . Marijuana abuse 08/09/2018  . Community acquired pneumonia of right lower lobe of lung 05/17/2018  . COPD exacerbation ([place]) 02/16/2018  . Hypertension 05/17/2016  . Low back pain radiating to both legs 04/18/2016  . History of tobacco abuse 04/06/2016  . Alcohol dependence syndrome ([place]) 04/06/2016  . COPD mixed type ([place]) 03/26/2016    Current Outpatient Medications:  .  albuterol (PROVENTIL) (2.5 MG/3ML) 0.083% nebulizer solution, Take 3 mLs (2.5 mg total) by nebulization every 6 (six) hours as needed for wheezing or shortness of breath., Disp: 150 mL, Rfl: 0 .  albuterol (VENTOLIN HFA) 108 (90 Base) MCG/ACT inhaler, Inhale 2 puffs into the lungs every 6 (six) hours as needed for wheezing or shortness of breath., Disp: 6.7 g, Rfl: 0 .  folic acid (FOLVITE) 1 MG tablet, Take 1 tablet (1 mg total) by mouth daily., Disp: 90 tablet, Rfl: 0 .  losartan (COZAAR) 100 MG tablet, Take 1 tablet (100 mg total) by mouth daily., Disp: 30 tablet, Rfl: 2 .  mometasone-formoterol (DULERA) 200-5 MCG/ACT AERO, Inhale 2 puffs into the lungs 2 (two) times daily., Disp: 13 g, Rfl: 0 .  pantoprazole (PROTONIX) 40 MG tablet, Take 1 tablet (40 mg total) by mouth daily., Disp: 30 tablet, Rfl: 0 .  predniSONE (DELTASONE) 20 MG tablet, Take 2 tablets (40 mg total) by mouth daily with  breakfast., Disp: 60 tablet, Rfl: 0 .  Respiratory Therapy Supplies (FLUTTER) DEVI, Use 4 times daily, Disp: 1 each, Rfl: 0 .  Spacer/Aero-Holding Chambers (AEROCHAMBER MV) inhaler, Use as instructed (Patient taking differently: 1 each by Other route See admin instructions. Use as instructed), Disp: 1 each, Rfl: 0 .  tiotropium (SPIRIVA HANDIHALER) 18 MCG inhalation capsule, Place 1 capsule (18 mcg total) into inhaler and inhale daily., Disp: 30 capsule, Rfl: 0 .  thiamine (VITAMIN B-1) 100 MG tablet, Take 1 tablet (100 mg total) by mouth daily. (Patient not taking: Reported on 07/18/2019), Disp: 30 tablet, Rfl: 3 Allergies  Allergen Reactions  . Gabapentin Other (See Comments)    hallucinations      Social History   Socioeconomic History  . Marital status: Divorced    Spouse name: Not on file  . Number of children: Not on file  . Years of education: Not on file  . Highest education level: Not on file  Occupational History  . Occupation: Dealer  . Occupation: Painter  Tobacco Use  . Smoking status: Former Smoker    Packs/day: 1.50    Years: 40.00    Pack years: 60.00    Types: Cigarettes    Quit date: 2014    Years since quitting: 7.5  . Smokeless tobacco: Current User    Types: Chew  Vaping Use  . Vaping Use: Never used  Substance and Sexual Activity  . Alcohol use: Yes    Alcohol/week: 28.0  standard drinks    Types: 28 Cans of beer per week    Comment: 4 beers a night; + jitters with not drinking, denies DTs or seizures  . Drug use: Yes    Types: Marijuana    Comment: daily; quit using crack and methamphetamine about 5 months ago   . Sexual activity: Not Currently    Partners: Female  Other Topics Concern  . Not on file  Social History Narrative   Lives alone near [place]   Social Determinants of Health   Financial Resource Strain: High Risk  . Difficulty of Paying Living Expenses: Hard  Food Insecurity: Unknown  . Worried About Charity fundraiser in the  Last Year: Not on file  . Ran Out of Food in the Last Year: Never true  Transportation Needs: No Transportation Needs  . Lack of Transportation (Medical): No  . Lack of Transportation (Non-Medical): No  Physical Activity: Inactive  . Days of Exercise per Week: 0 days  . Minutes of Exercise per Session: 0 min  Stress: Stress Concern Present  . Feeling of Stress : To some extent  Social Connections: Moderately Integrated  . Frequency of Communication with Friends and Family: Three times a week  . Frequency of Social Gatherings with Friends and Family: Once a week  . Attends Religious Services: 1 to 4 times per year  . Active Member of Clubs or Organizations: Yes  . Attends Archivist Meetings: 1 to 4 times per year  . Marital Status: Divorced  Human resources officer Violence:   . Fear of Current or Ex-Partner:   . Emotionally Abused:   [name] Physically Abused:   . Sexually Abused:     Physical Exam      Future Appointments  Date Time Provider [place]  07/23/2019  8:30 AM [name], MD [facility] None    BP 124/74   Pulse 90   Temp 98.7 F (37.1 C)   Resp 20   SpO2 96%   Pt reports he is doing ok,but today it was pretty bad.   he states he is using his 02. He states he really is not doing too much work-he states usually he is just sitting around most of the time. I told him that's what he needs to do.  He just got back from beach with his sister [name] that lives there.  She had said when she saw him using his inhalers he isnt really taking in a full breath with it and she wonders if he needs a spacer for it.   Looks like he had a spacer rx called in to [facility] on [address].  Called them to see if they had it ready--they have one on file but it was from last year and they need a new rx to fill it. He said he had one back in his room somewhere and he will look for it. I told him to use it. If he couldn't find it to let me know and well get the doc to send in  another rx.   He is not taking thiamine.  He did not take the azithromycin when he was released from the hosp-he had left for the beach by time it was ready.  Will ask his PCP to see if he still needs to take it.  He has f/u on Monday with PCP. He has all his inhalers and neb solutions.  He denies missing any meds.  He states that he  is coughing but isnt coughing up anything routinely, his GERD has improved a great deal.  No wheezing noted in lungs.    [name], [facility] Paramedic  07/18/19

## 2019-07-19 ENCOUNTER — Telehealth: Payer: Self-pay

## 2019-07-19 NOTE — Telephone Encounter (Signed)
Message received from [name], EMT Please advise.  Yanky didn't take that 1 tablet of zithromycin when he was d/c from hosp. He said teresa didn't get it for him before he left for beach with his other sister.  Can you ask if he needs to it now since its been so long he was d/c?   He was discharged from the hospital 07/07/2019

## 2019-07-20 NOTE — Telephone Encounter (Signed)
If he is asymptomatic he does not need the antibiotic.  Should he develop symptoms he will need to contact the clinic.

## 2019-07-23 ENCOUNTER — Encounter: Payer: Self-pay | Admitting: Critical Care Medicine

## 2019-07-23 ENCOUNTER — Other Ambulatory Visit: Payer: Self-pay

## 2019-07-23 ENCOUNTER — Ambulatory Visit: Payer: MEDICAID | Attending: Critical Care Medicine | Admitting: Critical Care Medicine

## 2019-07-23 VITALS — BP 162/99 | HR 101 | Temp 98.0°F | Resp 18 | Ht 69.0 in | Wt 187.0 lb

## 2019-07-23 DIAGNOSIS — M79604 Pain in right leg: Secondary | ICD-10-CM

## 2019-07-23 DIAGNOSIS — J189 Pneumonia, unspecified organism: Secondary | ICD-10-CM

## 2019-07-23 DIAGNOSIS — J9611 Chronic respiratory failure with hypoxia: Secondary | ICD-10-CM

## 2019-07-23 DIAGNOSIS — B009 Herpesviral infection, unspecified: Secondary | ICD-10-CM | POA: Insufficient documentation

## 2019-07-23 DIAGNOSIS — J449 Chronic obstructive pulmonary disease, unspecified: Secondary | ICD-10-CM | POA: Diagnosis not present

## 2019-07-23 DIAGNOSIS — J441 Chronic obstructive pulmonary disease with (acute) exacerbation: Secondary | ICD-10-CM

## 2019-07-23 DIAGNOSIS — Z91048 Other nonmedicinal substance allergy status: Secondary | ICD-10-CM

## 2019-07-23 DIAGNOSIS — Z87891 Personal history of nicotine dependence: Secondary | ICD-10-CM | POA: Diagnosis not present

## 2019-07-23 DIAGNOSIS — M545 Low back pain: Secondary | ICD-10-CM | POA: Diagnosis not present

## 2019-07-23 DIAGNOSIS — L2089 Other atopic dermatitis: Secondary | ICD-10-CM | POA: Diagnosis not present

## 2019-07-23 DIAGNOSIS — I10 Essential (primary) hypertension: Secondary | ICD-10-CM

## 2019-07-23 MED ORDER — ACYCLOVIR 200 MG PO CAPS: 200.0000 mg | ORAL_CAPSULE | Freq: Every day | ORAL | 0 refills | Status: AC

## 2019-07-23 MED ORDER — PERMETHRIN 5 % EX CREA: 1.0000 "application " | TOPICAL_CREAM | Freq: Once | CUTANEOUS | 0 refills | Status: AC

## 2019-07-23 MED ORDER — METOPROLOL SUCCINATE ER 25 MG PO TB24: 25.0000 mg | ORAL_TABLET | Freq: Every day | ORAL | 3 refills | Status: DC

## 2019-07-23 MED ORDER — PREDNISONE 20 MG PO TABS: ORAL_TABLET | ORAL | 0 refills | Status: DC

## 2019-07-23 MED ORDER — TRIAMCINOLONE ACETONIDE 0.1 % EX CREA
1.0000 | TOPICAL_CREAM | Freq: Two times a day (BID) | CUTANEOUS | 0 refills | Status: DC
Start: 2019-07-23 — End: 2019-08-23

## 2019-07-23 MED FILL — TRIAMCINOLONE ACETONIDE 0.1: 0.1 | 15 days supply | Qty: 30 | Fill #0

## 2019-07-23 MED FILL — METOPROLOL SUCCINATE ER 25: 25 | 90 days supply | Qty: 90 | Fill #0

## 2019-07-23 MED FILL — PERMETHRIN 5 % CREA: 5 | 1 days supply | Qty: 60 | Fill #0

## 2019-07-23 MED FILL — ACYCLOVIR 200 MG CAPSULE: 200 | 5 days supply | Qty: 25 | Fill #0

## 2019-07-23 MED FILL — predniSONE 20 MG TABS: 20 | 22 days supply | Qty: 60 | Fill #0

## 2019-07-23 NOTE — Progress Notes (Signed)
olu  Subjective:    Patient ID: James [name], male    DOB: April 04, 1960, 59 y.o.   MRN: [id number]  58 y.o.M with advanced Copd   I saw this patient a week ago and prescribed antibiotics and prednisone for what I perceived to be persisting pneumonia.  The patient had a slight improvement but then worsened and came to the emergency room the end of the week and was admitted for 3 days for COPD exacerbation and lower lobe pneumonia.  Chest x-ray did show persistent infiltrates in the lower lobes.  The patient is now discharged and returns to the office for post hospital follow-up.  Below is the discharge summary Dc summary : Admit date: 07/31/2018 Discharge date: 08/01/2018  Time spent: 45 minutes  Recommendations for Outpatient Follow-up:  Patient will be discharged to home.  Patient will need to follow up with primary care provider within one week of discharge.  Patient should continue medications as prescribed.  Patient should follow a heart healthy diet.   Discharge Diagnoses:  Acute on chronic respiratory failure with hypoxia secondary to COPD Exacerbation and pneumonia Essential hypertension  Discharge Condition: Stable  Diet recommendation: heart healthy  Filed Weights  07/31/18 0231 07/31/18 0602 Weight: 85.3 kg 83.4 kg   History of present illness:  on 07/31/2018 by Dr. Candie Mile [name] a 59 y.o.malewith medical history significant ofCOPD. Patient has had 3 prior admits for COPD / PNA thus far this year, most recently in May.  Patient has been being treated for COPD exacerbation and BLL CAP as outpatient by Dr. Joya [name] with levaquin and prednisone. Just finished levaquin yesterday. Prednisone remained at 40mg  daily with plans to initiate a slow taper.  Despite this, patient developed severe SOB and respiratory distress this evening. EMS called and patient noted to be satting 80% on RA. He is not on chronic O2 for his COPD.  Hospital Course:    Acute on chronic respiratory failure with hypoxia secondary to COPD Exacerbation and pneumonia -Patient noted to have oxygen saturations in the 80s on room air -Chest x-ray noted for pneumonia -Was recently placed on Levaquin and steroids by PCP approximately 1 week ago -On admission, patient continued to have wheezing well into his hospital stay day 1.  Patient currently still has some wheezing however has improved. -Was placed on Solu-Medrol, along with nebulizer treatments, azithromycin and ceftriaxone -Patient did require oxygen upon admission however has been able to ambulate and maintain oxygen saturations in the high 90s on room air. -Will discharge patient with prednisone taper, azithromycin and Ceftin. -Given that patient had oxygen saturations in the 80s on admission, it was thought that he would require several days in the hospital. Patient actually improved quicker than expected.  Essential hypertension -Continue losartan  Since discharge the patient is tried to go back to work but has extreme difficulty in the heat with this.  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  10/05/2018 This patient's not been seen since the end of July he had failed several follow-up visits.  He did see James [name] end of August who did not make major changes in  his medication program.  He is to remain on [name] and Incruse.  The patient after being admitted in July got readmitted early August and then again in early September.  Discharge summaries are as noted below.  During the August admission the patient underwent bronchoscopy and had mucous plugging removed the culture showed normal  flora Note this patient has not been actively smoking recently Admit date: 08/09/2018 Discharge date: 08/11/2018  Discharge Diagnoses:  Acute on chronic hypoxic respiratory failure/sepsis due to multifocal pneumonia COPD Alcohol/marijuana use Essential hypertension  History of present illness:  on 08/09/2018 by James [name] a 59 y.o.malewith medical history significant ofCOPD and ETOH dependence presenting with respiratory distress.He woke up about 530 this AM and couldn't breathe. This just keeps happening. He felt similarly when hospitalized last week. He has been doing fine until today. No h/o OSA, not on CPAP. Not on home O2. +cough, productive of yellowish sputum. It is sometimes hard to get the sputum up. No fever. +wheezing since his last hospitalization. Quit smoking in 2014. He lives in a non-air-conditioned log cabin.  He was hospitalized from 7/27-28 for acute on chronic respiratory failure associated with COPD exacerbation and PNA. This was his 4th admission this year. He was seen by Dr. Joya [name] on 7/30 with plan for completion of course of Keflex and prednisone, as well as prednisone taper.  Hospital Course:  Acute on chronic hypoxic respiratory failure/sepsis due to multifocal pneumonia -Was recently hospitalized in late July for pneumonia and COPD exacerbation. Was discharged with antibiotics as well as prednisone taper. Patient followed up with Dr. Joya [name] on 08/03/2018 with completion of Keflex and prednisone as well. -This is his fourth admission this year for similar presentation -Patient presented with cough and shortness of breath -Chest x-rayon admissionfindings consistent with right-sided multifocal pneumonia -CTA chest showed multifocal pneumonia on the right. COPD including emphysema. Negative for PE. -COVID negative -Upon admission, patient had leukocytosis, fever, tachycardia, tachypnea- all resolving  -Noted to be  hypoxic 88% on room air in the ED -Urine strep pneumoniaand legionellaantigensnegative -Sputum culture: Few GPC, GVR, rare yeast -Blood cultures show no growth -was placed on vancomycin, Zosyn, Solu-Medrol, supplemental oxygen -S/p bronchoscopy 8/6, pending culture results -CXR obtained after bronch on 8/6: almost complete clearing of the hazy infiltrates in the right lung -Discussed with Dr. Tamala Julian (pulm), recommended prednisone taper starting with prednisone 40mg  daily x 1 week, taper as an outpatient, along with Augmentin. He will follow up with the patient in one week. Given that infection cleared on xray so quickly, question if this is inflammatory vs infection.  -patient able to ambulate today without oxygen and maintained oxygen saturations in the mid 90s- therefore patient does not qualify for home oxygen -patient also had a swallow eval- mild aspiration risk  COPD -Continue nebulizer treatments, steroids, inhalers -Incentive spirometry, flutter valve  Alcohol/marijuana use -Discussed cessation -was placed on continue CIWA protocol  Essential hypertension -Continue Cozaar  Consultants PCCM- pulmonology   Procedures Bronchoscopy   Recommendations for Outpatient Follow-up:  1. Follow up with PCP in 1-2 weeks 2. Please obtain BMP/CBC in one week your next doctors visit.  3. Augmentin and azithromycin for 5 days 4. Advised to continue using bronchodilators every 4-6 hours for the next 4-5 days and then transition to as needed. 5. Advised to take his home prednisone, for next 3 days advised to increase it to 40 mg twice daily then resume back is 20 mg daily  Discharge Condition: Stable  CODE STATUS: Full code Diet recommendation: 2 g salt   DC summary from 09/11/18  Adm 9/7  D/C 09/13/18 Brief/Interim Summary: James [name] a 59 y.o.malewith history ofCOPD on prednisone, HTN, alcohol use disorder, marijuana use,tobacco user. Patient presented secondary to  dyspnea and found to have evidence of pneumonia and a COPD exacerbation in addition to sepsis physiology on admission.  There is concerns of possible right lower lobe pneumonia therefore started on Unasyn and azithromycin due to concerns of aspiration.  This was transitioned to Augmentin and azithromycin for total of 5 days at time of discharge.  Secondary to infection, he was having COPD exacerbation therefore on bronchodilators.  He started improving and on the day of discharge he was adamant about going home as he was feeling much better.  He was still having abnormal breath sounds and cough therefore I had requested him to stay another day in the hospital but still wanted to go home.  Instructions were given to him take 40 mg of prednisone for next 3 days and then he can resume back his 20 mg daily.  Advised... home bronchodilators periodically every 4-6 hours for next 4-5 days and then transition to as needed. Advised to follow-up with his PCP in about 1 week.   Discharge Diagnoses:  Active Problems:   Hypertension   COPD exacerbation ([place])   Marijuana abuse   Pneumonia   Sepsis ([place])  Acute respiratory distress secondary to right lower lobe pneumonia, community-acquired/aspiration Acute mild exacerbation of COPD Concern for possible aspiration.  Unasyn and azithromycin transition to Augmentin and azithromycin.  We will give him 5 more day course at the time of discharge 40 mg of prednisone for next 3 days then transition to 20 mg daily which is his home dose Advised to use bronchodilators.3  Alcohol use disorder Counseled to quit drinking alcohol.  Advised to continue thiamine, folate and multivitamin  Weakness -He did well with physical therapy.  Tobacco use Marijuana use Cessation discussed on admission  Consultations:  None  Subjective: Still having some cough and congestion but he is adamant about going home today as he is feeling much better than from admission.  I  recommended he stay in the hospital for at least 1 more day to help him feel even better.  He wished to go home. Ambulating in the hallway without additional saturating greater than 90% on room air.   Since discharge this patient went to urgent care on 21 September with an outbreak of shingles over his left lower back.  He is taken a course of valacyclovir and prednisone.  He has severe pain.  He was given gabapentin but this caused hallucinations and then we tried tramadol but this caused severe nausea and vomiting.  Apparently the patient cannot take Tylenol or nonsteroidal anti-inflammatories as well.  The patient states his breathing is been labored.  He still has productive cough of white mucus.  He has no fever.  He has significant back pain because of the shingles. The patient is maintaining the [facility] and Incruse as well as his antihypertensive medications and vitamins     10/12/2018  This patient is seen in return follow-up for acute shingles with postherpetic neuropathy, recent pneumonia, COPD exacerbation, and hypertension.  Patient is markedly improved from the last visit.  He has less cough less shortness of breath.  He has improved pain around the area of the shingles.  He is taking amitriptyline 25 mg at bedtime  The patient notes less  pain.  He does state he had been using the tramadol without nausea or vomiting.  I asked if patient would be interested in the para medicine program and expressed interest in the community para medicine program  11/23/2018 Patient seen in return follow-up and has noted increasing shortness of breath and we had to resume prednisone this patient.  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  He does maintain his inhalers.  He is drinking 4 beers a night.  He is not smoking.  Note the patient continues to live in a home that has standing water under the foundation and in several the rooms and mold on the carpet.  We have connected this patient with legal  aid to see if they can assist him in getting into better housing.  The patient's not had previous allergy testing   12/13/2018 The patient notes increased shortness of breath, wheezing and cough productive of thick yellow mucus.  He is using the flutter valve but is not being is productive with this.  He denies chest pain or fever.  He is down to 2 a day on the prednisone.  He is maintaining his inhalers. Apparently there is repair is being done on the patient's home and he is decided to stay in the home  02/27/2019 This patient is seen in return follow-up for COPD.  The patient has significant mold exposure in the home.  There has been some mitigation of this but is not complete.  The patient just was in the urgent care for COPD exacerbation a week ago treated with a course of antibiotics and prednisone.  He is now off prednisone. Patient states the pain in the back has resolved however he is still having increased shortness of breath.  He still having productive cough of thick yellow mucus.  05/01/2019  Since the last OV the patient has been admitted for Copd flare again. Below is the discharge summary Dc summary:  Admit Date: 04/23/2019 Discharge date: 04/27/2019  Recommendations for Outpatient Follow-up:  1. Follow up with PCP in 1-2 weeks 2. Please obtain CMP/CBC in one week 3. Please repeat chest x-ray in the next 6 to 8 weeks to document resolution of infiltrates, if not may require further work-up.     Brief Narrative: Patient is a26 y.o.malewith history of COPD, intellectual disability, HTN who presented with shortness of breath-found to have acute hypoxic respiratory failure secondary to pneumonia and COPD exacerbation. Patient initially required BiPAP on admission. See below for further details.  Significant events: 4/19>> admit to [facility] for severe hypoxemia requiring BiPAP 4/20>> liberated from BiPAP  Antimicrobial therapy: Rocephin 4/18>>4/23 Zithromax  4/18>>4/22  Microbiology data: Blood culture: 4/19>> negative  Procedures : None  Consults: None  Brief Hospital Course: Acute hypoxic/hypercarbic respiratory failure secondary to COPD exacerbation and pneumonia:Improved-initially required BiPAP on admission-titrated off BiPAP on 4/20.Now on room air with stable O2 saturations.  Feels much better-treated with Rocephin and Zithromax-we will transition to [facility] for a few more days on discharge.  He will resume his usual inhaler regimen-he will be placed on 20 mg of p.o. prednisone until he is seen by Dr. Joya [name].  PCP to repeat a chest x-ray in 6 to 8 weeks to document resolution of infiltrates-if not he may require further work-up to rule out underlying malignancy.  (Per patient's sister-patient probably has been taking more prednisone than he should be-he is unable to read-and is not able to follow directions)  HTN: BPstable-continue losartan  EtOH abuse:No signs of withdrawal-managed with  Ativan per protocol. Per patient's sister-he drinks around 2-3 bottles of beer on a daily basis.  Intellectual disability:Spoke at length with patient's sister ([name]) today-patient isa illiterate-and has been living on his own for all his life. His other sister lives a [place] and checks on him daily. Sister acknowledges poor living conditions-but apparently this is the way that patient has lived all his life-and chooses to live this way. 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 Patient is currently managing all his finances. [name] does acknowledge that patient will not let any home health services come to his house-he will not go to SNF. Difficult situation-but even though patient is an illiterate/intellectual disability-he understand that he is somewhat deconditioned from his acute illness-and is in need of some help-he does not want therapy services-he understands the risk of falls from being weak.  This MD had numerous  discussions with him regarding his appropriate disposition-all he wants to do is go home-his sister-[name] wants to have him come to her place in [place], [place] for a few weeks-however the patient really does not want to go there-and wants to go home.  I have discussed at length with social worker as well-at this point he is being discharged home at his own request.  His family will keep an eye on him.   Nutrition Problem: Nutrition Problem: Increased nutrient needs Etiology: chronic illness(COPD) Signs/Symptoms: estimated needs Interventions: Ensure Enlive (each supplement provides 350kcal and 20 grams of protein)  Discharge Diagnoses:  Principal Problem:   Acute on chronic respiratory failure with hypoxia and hypercapnia (HCC) Active Problems:   Hypertension   COPD exacerbation ([place])   Community acquired pneumonia of right lower lobe of lung   Marijuana abuse   Intellectual disability  Since discharge the patient has completed his antibiotics for his right lower lobe pneumonia.  He is on tapering prednisone.  He has developed a rash on his arms and face.  The rash is pruritic in nature.  He has been maintaining his medications as prescribed.   07/23/2019 Since the last visit this patient was readmitted to the hospital between 30 June and 3 July.  Below is a copy of the discharge summary Discharge Diagnosis: 1. Acute hypoxic respiratory 2/2 COPD exacerbation vs pneumonitis 2. GERD 3. EtOH history 4. HTN  Discharge Medications: Allergies as of 07/07/2019     Reactions  Gabapentin Other (See Comments)  hallucinations  Disposition and follow-up:   James [name] was discharged from [facility] in Stable condition.  At the hospital follow up visit please address:  1.  COPD exacerbation: Continue daily Albuterol, Dulera, Spiriva, and prednisone 40 mg daily. Sent home with 1 additional day of azithromycin. Discharged on 2L [place]. F/u allergy labs.    GERD: Severe reflux noted on esophagram, started on pantoprazole daily, may need GI follow up.   2.  Labs / imaging needed at time of follow-up: None  3.  Pending labs/ test needing follow-up: IgE, allergen panel for mold, hypersensitivity pneumonitis  Note since discharge the patient's breathing is back to baseline.  The patient does have some wheezing and minimal productive cough.  Noticed blood pressures ranged up at this visit.  He is maintaining Dulera Spiriva as before he has been maintained on prednisone 40 mg daily and also discharged on 2 L oxygen.  He no longer is actively working at this time.  Note his mold assay did show a mild hypersensitivity to some yeast.    Shortness of  Breath This is a chronic problem. The current episode started more than 1 year ago. The problem occurs daily (qhs shortness of breath). The problem has been gradually improving. Associated symptoms include PND, sputum production and wheezing. Pertinent negatives include no chest pain, claudication, fever, headaches, hemoptysis, leg pain, leg swelling, orthopnea, rash, sore throat, syncope or vomiting. The symptoms are aggravated by any activity and weather changes. Associated symptoms comments: Pain radiates to arm Legs go to sleep No dysphagia No gerd . Risk factors include smoking. He has tried beta agonist inhalers, oral steroids and steroid inhalers for the symptoms. The treatment provided moderate relief. His past medical history is significant for COPD and pneumonia. There is no history of asthma, CAD, DVT, a heart failure or PE.    Past Medical History:  Diagnosis Date  . Acute on chronic respiratory failure with hypoxia ([place]) 06/08/2013  . CAP (community acquired pneumonia) 05/17/2018   See admit 05/17/18 ? rml  with   covid pcr neg - rx augmentin > f/u cxr in 4-6 weeks is fine unless condition declines   . Community acquired pneumonia of right lower lobe of lung 05/17/2018   See admit 05/17/18 ? rml   with   covid pcr neg - rx augmentin > f/u cxr in 4-6 weeks is fine unless condition declines   . COPD (chronic obstructive pulmonary disease) ([place])    not on home  . Dyspnea   . Hypertension   . Pneumonia 04/06/2016  . Pneumothorax    2016, fell from ladder  . 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 Tick bite 06/03/2018     Family History  Problem Relation Age of Onset  . Heart disease Father   . COPD Sister      Social History   Socioeconomic History  . Marital status: Divorced    Spouse name: Not on file  . Number of children: Not on file  . Years of education: Not on file  . Highest education level: Not on file  Occupational History  . Occupation: Dealer  . Occupation: Painter  Tobacco Use  . Smoking status: Former Smoker    Packs/day: 1.50    Years: 40.00    Pack years: 60.00    Types: Cigarettes    Quit date: 2014    Years since quitting: 7.5  . Smokeless tobacco: Current User    Types: Chew  Vaping Use  . Vaping Use: Never used  Substance and Sexual Activity  . Alcohol use: Yes    Alcohol/week: 28.0 standard drinks    Types: 28 Cans of beer per week    Comment: 4 beers a night; + jitters with not drinking, denies DTs or seizures  . Drug use: Yes    Types: Marijuana    Comment: daily; quit using crack and methamphetamine about 5 months ago   . Sexual activity: Not Currently    Partners: Female  Other Topics Concern  . Not on file  Social History Narrative   Lives alone near [place]   Social Determinants of Health   Financial Resource Strain: High Risk  . Difficulty of Paying Living Expenses: Hard  Food Insecurity: Unknown  . Worried About Charity fundraiser in the Last Year: Not on file  . Ran Out of Food in the Last Year: Never true  Transportation Needs: No Transportation Needs  . Lack of Transportation (Medical): No  . Lack of Transportation (Non-Medical): No  Physical Activity: Inactive  . Days of Exercise per Week:  0 days  . Minutes  of Exercise per Session: 0 min  Stress: Stress Concern Present  . Feeling of Stress : To some extent  Social Connections: Moderately Integrated  . Frequency of Communication with Friends and Family: Three times a week  . Frequency of Social Gatherings with Friends and Family: Once a week  . Attends Religious Services: 1 to 4 times per year  . Active Member of Clubs or Organizations: Yes  . Attends Archivist Meetings: 1 to 4 times per year  . Marital Status: Divorced  Human resources officer Violence:   . Fear of Current or Ex-Partner:   . Emotionally Abused:   [name] Physically Abused:   . Sexually Abused:      Allergies  Allergen Reactions  . Gabapentin Other (See Comments)    hallucinations     Outpatient Medications Prior to Visit  Medication Sig Dispense Refill  . albuterol (PROVENTIL) (2.5 MG/3ML) 0.083% nebulizer solution Take 3 mLs (2.5 mg total) by nebulization every 6 (six) hours as needed for wheezing or shortness of breath. 150 mL 0  . albuterol (VENTOLIN HFA) 108 (90 Base) MCG/ACT inhaler Inhale 2 puffs into the lungs every 6 (six) hours as needed for wheezing or shortness of breath. 6.7 g 0  . folic acid (FOLVITE) 1 MG tablet Take 1 tablet (1 mg total) by mouth daily. 90 tablet 0  . losartan (COZAAR) 100 MG tablet Take 1 tablet (100 mg total) by mouth daily. 30 tablet 2  . mometasone-formoterol (DULERA) 200-5 MCG/ACT AERO Inhale 2 puffs into the lungs 2 (two) times daily. 13 g 0  . pantoprazole (PROTONIX) 40 MG tablet Take 1 tablet (40 mg total) by mouth daily. 30 tablet 0  . Respiratory Therapy Supplies (FLUTTER) DEVI Use 4 times daily 1 each 0  . Spacer/Aero-Holding Chambers (AEROCHAMBER MV) inhaler Use as instructed 1 each 0  . thiamine (VITAMIN B-1) 100 MG tablet Take 1 tablet (100 mg total) by mouth daily. 30 tablet 3  . tiotropium (SPIRIVA HANDIHALER) 18 MCG inhalation capsule Place 1 capsule (18 mcg total) into inhaler and inhale daily. 30 capsule 0  . predniSONE  (DELTASONE) 20 MG tablet Take 2 tablets (40 mg total) by mouth daily with breakfast. 60 tablet 0   No facility-administered medications prior to visit.     Review of Systems  Constitutional: Negative for fever.  HENT: Negative for sinus pressure, sinus pain, sore throat and trouble swallowing.   Eyes: Negative.   Respiratory: Positive for cough, sputum production, chest tightness, shortness of breath and wheezing. Negative for hemoptysis.   Cardiovascular: Positive for PND. Negative for chest pain, palpitations, orthopnea, claudication, leg swelling and syncope.  Gastrointestinal: Negative for nausea and vomiting.  Genitourinary: Negative.   Musculoskeletal: Positive for back pain.  Skin: Negative for rash.  Neurological: Negative for headaches.  Psychiatric/Behavioral: Negative for hallucinations. The patient is not nervous/anxious and is not hyperactive.        Objective:   Physical Exam Vitals:   07/23/19 0831  BP: (!) 162/99  Pulse: (!) 101  Resp: 18  Temp: 98 F (36.7 C)  SpO2: 98%  Weight: 187 lb (84.8 kg)  Height: 5\' 9"  (1.753 m)    Gen: , well-nourished, in no distress,   ENT: No lesions,  mouth clear,  oropharynx clear, no postnasal drip  Neck: No JVD, no TMG, no carotid bruits  Lungs: No use of accessory muscles, no dullness to percussion, inspiratory and expiratory wheeze with poor airflow  Cardiovascular: RRR, heart sounds normal, no murmur or gallops, no peripheral edema  Abdomen: soft and NT, no HSM,  BS normal  Musculoskeletal: No deformities, no cyanosis or clubbing  Neuro: alert, non focal  Skin: Warm, rash over arms and face  Disability eval at [facility]:   Pulmonary Function Test (04/19/2019 1:42 PM EDT) Pulmonary Function Test (04/19/2019 1:42 PM EDT)  Component Value Ref Range Performed At Pathologist Signature  FVC Post 2.51 L [facility]   FEV1 Post 1.55 L [facility]   FEV1/FVC Post 61.87 %  [facility]   FEF25-75% Post 0.90 L/s [facility]   PEF Post 3.42 L/s [facility]   MVV Post 42.00 L/min [facility]   FVC Pre 2.92 L [facility]   FEV1 Pre 1.57 L [facility]   FEV1/FVC Pre 54.01 % [facility]   FEF25-75% Pre 0.68 L/s [facility]   PEF Pre 4.14 L/s [facility]   MVV Pre 45.00 L/min [facility]   FVC_LLN 3.73  [facility]   FVC_Z-SCORE -2.84  [facility]   FVC_%PRED 60 % % [facility]   FVC_Z-SCORE -2.84  [facility]   FVC_%PRED 52 % % [facility]   FVC %Chng -14 % % [facility]   FEV1_LLN 2.86  [facility]   FEV1_Z-SCORE -3.78  [facility]   FEV1_%PRED 42 % % [facility]   FEV1_Z-SCORE -3.78  [facility]   FEV1_%PRED 41 % % [facility]   FEV1 %Chng -2 % % [facility]   FEV1/FVC_LLN 66  [facility]   FEV1/FVC %Chng 15 % % [facility]   FEF25-75%_LLN 1.60  [facility]   FEF25-75%_Z-SCORE -3.05  [facility]   FEF25-75%_%PRED 21 % % [facility]   FEF25-75%_Z-SCORE -3.05  [facility]   FEF25-75%_%PRED 28 % % [facility]   FEF25-75% %Chng 33 % % [facility]   PEF_LLN 7.25  [facility]   PEF_%PRED 43 % 36 % % [facility]   PEF %Chng -17 % % [facility]   MVV_LLN 130  [facility]   MVV_%PRED 35 % 32 % % [facility]   MVV %Chng -7 % % [facility]     Media Information    Document Information  Photos    11/02/2018  14:43  Attached To:  [name]  Source Information  [name], MD  [facility]    2/15 CXR:/ABD NAD   BMP Latest Ref Rng & Units 07/05/2019 07/04/2019 06/22/2019  Glucose 70 - 99 mg/dL 122(H) 148(H) 122(H)  BUN 6 - 20 mg/dL 14 9 15   Creatinine 0.61 - 1.24 mg/dL 0.74 0.86 0.70  BUN/Creat Ratio 9 - 20 - - -  Sodium 135 - 145 mmol/L 140 143 140  Potassium 3.5 - 5.1 mmol/L 4.1 4.3 3.9  Chloride 98 - 111 mmol/L 104 105 104  CO2 22 - 32 mmol/L 29 28 26   Calcium 8.9 - 10.3 mg/dL 8.9 9.2 9.3   Hepatic Function Panel     Component Value  Date/Time   PROT 5.2 (L) 07/05/2019 0126   PROT 6.0 06/20/2018 1159   ALBUMIN 2.7 (L) 07/05/2019 0126   ALBUMIN 4.1 06/20/2018 1159   AST 13 (L) 07/05/2019 0126   ALT 21 07/05/2019 0126   ALT 62 (H) 06/23/2016 1226   ALKPHOS 35 (L) 07/05/2019 0126   BILITOT 0.8 07/05/2019 0126   BILITOT 0.4 06/20/2018 1159   BILIDIR 0.1 06/20/2019 0619   BILIDIR 0.12 04/16/2016 1601   IBILI 0.6 06/20/2019 0619   CBC Latest Ref Rng & Units 07/07/2019 07/06/2019 07/05/2019  WBC 4.0 - 10.5 K/uL 15.0(H) 15.2(H) 21.7(H)  Hemoglobin 13.0 - 17.0 g/dL 14.6 13.2 12.5(L)  Hematocrit 39 - 52 % 44.0 40.5 38.2(L)  Platelets 150 - 400 K/uL 301 245 241       Assessment & Plan:  I personally reviewed all images and lab data in the [facility] system as well as any outside material available during this office visit and agree with the  radiology impressions.   COPD exacerbation (HCC) Recent COPD exacerbation with right lung pneumonia  No further antibiotics indicated  We will slowly taper down the prednisone to 20 mg a day and hold  No change in inhaled medications  Patient is advised to quit working at this time and go completely on disability  Chronic respiratory failure with hypoxia ([place]) Acute on chronic respiratory failure resolved now just chronic respiratory failure on oxygen 2 L nightly  Community acquired pneumonia of right lower lobe of  lung Right lower lobe pneumonia now resolved  Other atopic dermatitis Atopic dermatitis left arm and also axillary reaction to mites  Plan topical triamcinolone and 1 dose of Elimite 5% cream  Allergy to mold Allergy to mold confirmed on recent testing  Herpes simplex disease Herpes simplex disease of the oral cavity and lips  5-day course of acyclovir given   Diagnoses and all orders for this visit:  Essential hypertension -     Comprehensive metabolic panel -     CBC with Differential/Platelet  COPD mixed type ([place])  History of tobacco abuse  Low back pain radiating to both legs  Community acquired pneumonia of right lower lobe of lung  COPD exacerbation (HCC)  Chronic respiratory failure with hypoxia ([place])  Other atopic dermatitis  Allergy to mold  Herpes simplex disease  Other orders -     permethrin (ELIMITE) 5 % cream; Apply 1 application topically once for 1 dose. -     triamcinolone cream (KENALOG) 0.1 %; Apply 1 application topically 2 (two) times daily. -     metoprolol succinate (TOPROL-XL) 25 MG 24 hr tablet; Take 1 tablet (25 mg total) by mouth daily. -     acyclovir (ZOVIRAX) 200 MG capsule; Take 1 capsule (200 mg total) by mouth 5 (five) times daily for 5 days. -     predniSONE (DELTASONE) 20 MG tablet; Take 4 tablets daily for 5 days then 3 tablets for 5 days then two tablets a day and stay

## 2019-07-23 NOTE — Assessment & Plan Note (Signed)
Acute on chronic respiratory failure resolved now just chronic respiratory failure on oxygen 2 L nightly

## 2019-07-23 NOTE — Assessment & Plan Note (Signed)
Right lower lobe pneumonia now resolved

## 2019-07-23 NOTE — Assessment & Plan Note (Signed)
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

## 2019-07-23 NOTE — Progress Notes (Signed)
Here for COPD f /u

## 2019-07-23 NOTE — Assessment & Plan Note (Signed)
Allergy to mold confirmed on recent testing

## 2019-07-23 NOTE — Patient Instructions (Signed)
Apply [place] cream once to left arm and left armpit  Use triamcinolone on left arm twice daily  Take acyclovir 5 times a day for 5 days for mouth  Taper prednisone after 5 more days of 40mg  a day to 30mg  a day for 5 days then two a day and stay  Labs today cmet and cbc  Return 1 month  Please get a covid vaccine  COVID-19 Vaccine Information can be found at: ShippingScam.co.uk For questions related to vaccine distribution or appointments, please email [email or web address] or call ([phone].

## 2019-07-23 NOTE — Assessment & Plan Note (Signed)
Atopic dermatitis left arm and also axillary reaction to mites  Plan topical triamcinolone and 1 dose of Elimite 5% cream

## 2019-07-23 NOTE — Assessment & Plan Note (Signed)
Recent COPD exacerbation with right lung pneumonia  No further antibiotics indicated  We will slowly taper down the prednisone to 20 mg a day and hold  No change in inhaled medications  Patient is advised to quit working at this time and go completely on disability

## 2019-07-24 ENCOUNTER — Telehealth: Payer: Self-pay | Admitting: Critical Care Medicine

## 2019-07-24 DIAGNOSIS — E119 Type 2 diabetes mellitus without complications: Secondary | ICD-10-CM | POA: Insufficient documentation

## 2019-07-24 DIAGNOSIS — J189 Pneumonia, unspecified organism: Secondary | ICD-10-CM

## 2019-07-24 LAB — CBC WITH DIFFERENTIAL/PLATELET
Basophils Absolute: 0.1 10*3/uL (ref 0.0–0.2)
Basos: 0 %
EOS (ABSOLUTE): 0.2 10*3/uL (ref 0.0–0.4)
Eos: 1 %
Hematocrit: 44.3 % (ref 37.5–51.0)
Hemoglobin: 14.9 g/dL (ref 13.0–17.7)
Immature Grans (Abs): 0.1 10*3/uL (ref 0.0–0.1)
Immature Granulocytes: 1 %
Lymphocytes Absolute: 2.4 10*3/uL (ref 0.7–3.1)
Lymphs: 17 %
MCH: 33 pg (ref 26.6–33.0)
MCHC: 33.6 g/dL (ref 31.5–35.7)
MCV: 98 fL — ABNORMAL HIGH (ref 79–97)
Monocytes Absolute: 1.2 10*3/uL — ABNORMAL HIGH (ref 0.1–0.9)
Monocytes: 8 %
Neutrophils Absolute: 10.5 10*3/uL — ABNORMAL HIGH (ref 1.4–7.0)
Neutrophils: 73 %
Platelets: 305 10*3/uL (ref 150–450)
RBC: 4.52 x10E6/uL (ref 4.14–5.80)
RDW: 11.9 % (ref 11.6–15.4)
WBC: 14.4 10*3/uL — ABNORMAL HIGH (ref 3.4–10.8)

## 2019-07-24 LAB — COMPREHENSIVE METABOLIC PANEL
ALT: 21 IU/L (ref 0–44)
AST: 18 IU/L (ref 0–40)
Albumin/Globulin Ratio: 2.1 (ref 1.2–2.2)
Albumin: 4 g/dL (ref 3.8–4.9)
Alkaline Phosphatase: 51 IU/L (ref 48–121)
BUN/Creatinine Ratio: 16 (ref 9–20)
BUN: 15 mg/dL (ref 6–24)
Bilirubin Total: <0.2 mg/dL (ref 0.0–1.2)
CO2: 26 mmol/L (ref 20–29)
Calcium: 9.4 mg/dL (ref 8.7–10.2)
Chloride: 100 mmol/L (ref 96–106)
Creatinine, Ser: 0.96 mg/dL (ref 0.76–1.27)
GFR calc Af Amer: 100 mL/min/{1.73_m2} (ref >59)
GFR calc non Af Amer: 87 mL/min/{1.73_m2} (ref >59)
Globulin, Total: 1.9 g/dL (ref 1.5–4.5)
Glucose: 152 mg/dL — ABNORMAL HIGH (ref 65–99)
Potassium: 4.2 mmol/L (ref 3.5–5.2)
Sodium: 141 mmol/L (ref 134–144)
Total Protein: 5.9 g/dL — ABNORMAL LOW (ref 6.0–8.5)

## 2019-07-24 MED ORDER — METFORMIN HCL 500 MG PO TABS: 500.0000 mg | ORAL_TABLET | Freq: Every day | ORAL | 3 refills | Status: DC

## 2019-07-24 MED ORDER — MOXIFLOXACIN HCL 400 MG PO TABS: 400.0000 mg | ORAL_TABLET | Freq: Every day | ORAL | 0 refills | Status: AC

## 2019-07-24 MED FILL — METFORMIN HCL 500 MG TABS: 500 | 90 days supply | Qty: 90 | Fill #0

## 2019-07-24 MED FILL — MOXIFLOXACIN HCL 400 MG TAB: 400 | 5 days supply | Qty: 5 | Fill #0

## 2019-07-24 NOTE — Telephone Encounter (Signed)
I called the patient and sister James [name].  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  He also has active lung infection with elevated WBC  I gave him all his results.  He tells me he is not working.  I requested he consider moving to a nicer apt. He said "no ,  I need to be around the people I know"      I will Rx Avelox and metformin

## 2019-07-27 ENCOUNTER — Other Ambulatory Visit: Payer: Self-pay | Admitting: *Deleted

## 2019-07-27 NOTE — Patient Outreach (Addendum)
07/27/2019  James [name] 06/29/1960 [id number]  James [name] is an 59 y.o. male  Subjective: Telephone call to patient's home number times 2, spoke with patient's sister / designated party release [name]), states [facility], is not a home, sister also unable to talk at this time, left HIPAA compliant message for Diamond Martucci, and requested call back.      Objective: Per KPN (Knowledge Performance Now, point of care tool) and chart review, patient hospitalized 07/04/2019 - 07/07/2019 for  Acute hypoxic respiratory 2/2 COPD exacerbation vs pneumonitis.   Patient hospitalized 04/23/2019 - 04/27/2019 for acute hypoxic respiratory failure secondary to pneumonia and COPD exacerbation.   Patient also has a history of COPD, intellectual disability, Hypertension, and Herpes simplex disease.    Encounter Medications:   Outpatient Encounter Medications as of 07/27/2019  Medication Sig  . acyclovir (ZOVIRAX) 200 MG capsule Take 1 capsule (200 mg total) by mouth 5 (five) times daily for 5 days.  [name] albuterol (PROVENTIL) (2.5 MG/3ML) 0.083% nebulizer solution Take 3 mLs (2.5 mg total) by nebulization every 6 (six) hours as needed for wheezing or shortness of breath.  [name] albuterol (VENTOLIN HFA) 108 (90 Base) MCG/ACT inhaler Inhale 2 puffs into the lungs every 6 (six) hours as needed for wheezing or shortness of breath.  . folic acid (FOLVITE) 1 MG tablet Take 1 tablet (1 mg total) by mouth daily.  [name] losartan (COZAAR) 100 MG tablet Take 1 tablet (100 mg total) by mouth daily.  . metFORMIN (GLUCOPHAGE) 500 MG tablet Take 1 tablet (500 mg total) by mouth daily with breakfast.  . metoprolol succinate (TOPROL-XL) 25 MG 24 hr tablet Take 1 tablet (25 mg total) by mouth daily.  . mometasone-formoterol (DULERA) 200-5 MCG/ACT AERO Inhale 2 puffs into the lungs 2 (two) times daily.  [name] moxifloxacin (AVELOX) 400 MG tablet Take 1 tablet (400 mg total) by mouth daily for 5 days.  . pantoprazole (PROTONIX) 40 MG  tablet Take 1 tablet (40 mg total) by mouth daily.  . permethrin (ELIMITE) 5 % cream Apply 1 application topically once.  . predniSONE (DELTASONE) 20 MG tablet Take 4 tablets daily for 5 days then 3 tablets for 5 days then two tablets a day and stay  . Respiratory Therapy Supplies (FLUTTER) DEVI Use 4 times daily  . Spacer/Aero-Holding Chambers (AEROCHAMBER MV) inhaler Use as instructed  . thiamine (VITAMIN B-1) 100 MG tablet Take 1 tablet (100 mg total) by mouth daily.  [name] tiotropium (SPIRIVA HANDIHALER) 18 MCG inhalation capsule Place 1 capsule (18 mcg total) into inhaler and inhale daily.  [name] triamcinolone cream (KENALOG) 0.1 % Apply 1 application topically 2 (two) times daily.   No facility-administered encounter medications on file as of 07/27/2019.    Goals Addressed   None     SDOH Screenings   Alcohol Screen:   . Last Alcohol Screening Score (AUDIT):   Depression (PHQ2-9): Low Risk   . PHQ-2 Score: 0  Financial Resource Strain: High Risk  . Difficulty of Paying Living Expenses: Hard  Food Insecurity: Unknown  . Worried About Charity fundraiser in the Last Year: Not on file  . Ran Out of Food in the Last Year: Never true  Housing:   . Last Housing Risk Score:   Physical Activity: Inactive  . Days of Exercise per Week: 0 days  . Minutes of Exercise per Session: 0 min  Social Connections: Moderately Integrated  . Frequency of Communication with Friends and Family:  Three times a week  . Frequency of Social Gatherings with Friends and Family: Once a week  . Attends Religious Services: 1 to 4 times per year  . Active Member of Clubs or Organizations: Yes  . Attends Archivist Meetings: 1 to 4 times per year  . Marital Status: Divorced  Stress: Stress Concern Present  . Feeling of Stress : To some extent  Tobacco Use: High Risk  . Smoking Tobacco Use: Former Smoker  . Smokeless Tobacco Use: Current User  Transportation Needs: No Transportation Needs  . Lack of  Transportation (Medical): No  . Lack of Transportation (Non-Medical): No       Assessment:  Received Medicaid Pending / Healthy Blue referral on 07/16/2019.  Referral carbon copied to:  Dr.  Lonia Skinner.   Reason for Referral: Chronic Care Management (CCM) billable service  Care Coordination (Managed Medicaid)  Disease management services needed:    MM Services needed: Pharmacy   Reason for Consult Medication Adherence  Medication Assistance  Medication Management  Polypharmacy Ensure he is taking his COPD medications    Reason for Referral: Chronic Care Management (CCM) billable service  Care Coordination (Managed Medicaid)  Disease management services needed:    MM Services needed: Pharmacy   Reason for Consult Medication Adherence  Medication Assistance  Medication Management  Polypharmacy Ensure he is taking his COPD medications    Reason for Referral: Chronic Care Management (CCM) billable service  Care Coordination (Managed Medicaid)  Disease management services needed:    MM Services needed: Pharmacy   Reason for Consult Medication Adherence  Medication Assistance  Medication Management  Polypharmacy Ensure he is taking his COPD medications    Screening  follow up pending patient contact.      Plan: RNCM will send unsuccessful outreach  letter, [facility] pamphlet, will call patient for 2nd telephone outreach attempt within 4 business days, screening follow up, and will proceed with case closure, after 4th unsuccessful outreach call.     Jakyla Reza H. Annia Friendly, BSN, [place] [facility] Telephonic CM Phone: [phone] Fax: [phone]

## 2019-08-01 ENCOUNTER — Ambulatory Visit: Payer: Self-pay | Admitting: *Deleted

## 2019-08-09 ENCOUNTER — Ambulatory Visit: Payer: Self-pay | Admitting: *Deleted

## 2019-08-09 ENCOUNTER — Other Ambulatory Visit (HOSPITAL_COMMUNITY): Payer: Self-pay

## 2019-08-09 NOTE — Progress Notes (Signed)
Paramedicine Encounter    Patient ID: James [name], male    DOB: 01/20/1960, 59 y.o.   MRN: [id number]   Patient Care Team: James Stain, MD as PCP - General (Pulmonary Disease) James [name], James Freer, MD as PCP - Cardiology (Cardiology) James Colonel, RN as [place] Management  Patient Active Problem List   Diagnosis Date Noted  . Type 2 diabetes mellitus without complication, without long-term current use of insulin ([place]) 07/24/2019  . 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 Other atopic dermatitis 05/01/2019  . Chronic respiratory failure with hypoxia ([place]) 04/23/2019  . Intellectual disability 04/23/2019  . 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 Marijuana abuse 08/09/2018  . Community acquired pneumonia of right lower lobe of lung 05/17/2018  . COPD exacerbation ([place]) 02/16/2018  . Hypertension 05/17/2016  . Low back pain radiating to both legs 04/18/2016  . History of tobacco abuse 04/06/2016  . Alcohol dependence syndrome ([place]) 04/06/2016  . COPD mixed type ([place]) 03/26/2016    Current Outpatient Medications:  .  albuterol (PROVENTIL) (2.5 MG/3ML) 0.083% nebulizer solution, Take 3 mLs (2.5 mg total) by nebulization every 6 (six) hours as needed for wheezing or shortness of breath., Disp: 150 mL, Rfl: 0 .  albuterol (VENTOLIN HFA) 108 (90 Base) MCG/ACT inhaler, Inhale 2 puffs into the lungs every 6 (six) hours as needed for wheezing or shortness of breath., Disp: 6.7 g, Rfl: 0 .  folic acid (FOLVITE) 1 MG tablet, Take 1 tablet (1 mg total) by mouth daily., Disp: 90 tablet, Rfl: 0 .  metFORMIN (GLUCOPHAGE) 500 MG tablet, Take 1 tablet (500 mg total) by mouth daily with breakfast., Disp: 60 tablet, Rfl: 3 .  metoprolol succinate (TOPROL-XL) 25 MG 24 hr tablet, Take 1 tablet (25 mg total) by mouth daily., Disp: 90 tablet, Rfl: 3 .  mometasone-formoterol (DULERA) 200-5 MCG/ACT AERO, Inhale 2 puffs into the lungs 2 (two) times daily., Disp: 13 g, Rfl: 0 .   pantoprazole (PROTONIX) 40 MG tablet, Take 1 tablet (40 mg total) by mouth daily., Disp: 30 tablet, Rfl: 0 .  permethrin (ELIMITE) 5 % cream, Apply 1 application topically once., Disp: , Rfl:  .  predniSONE (DELTASONE) 20 MG tablet, Take 4 tablets daily for 5 days then 3 tablets for 5 days then two tablets a day and stay, Disp: 60 tablet, Rfl: 0 .  Respiratory Therapy Supplies (FLUTTER) DEVI, Use 4 times daily, Disp: 1 each, Rfl: 0 .  Spacer/Aero-Holding Chambers (AEROCHAMBER MV) inhaler, Use as instructed, Disp: 1 each, Rfl: 0 .  tiotropium (SPIRIVA HANDIHALER) 18 MCG inhalation capsule, Place 1 capsule (18 mcg total) into inhaler and inhale daily., Disp: 30 capsule, Rfl: 0 .  triamcinolone cream (KENALOG) 0.1 %, Apply 1 application topically 2 (two) times daily., Disp: 30 g, Rfl: 0 .  losartan (COZAAR) 100 MG tablet, Take 1 tablet (100 mg total) by mouth daily. (Patient not taking: Reported on 08/09/2019), Disp: 30 tablet, Rfl: 2 .  thiamine (VITAMIN B-1) 100 MG tablet, Take 1 tablet (100 mg total) by mouth daily. (Patient not taking: Reported on 08/09/2019), Disp: 30 tablet, Rfl: 3 Allergies  Allergen Reactions  . Gabapentin Other (See Comments)    hallucinations      Social History   Socioeconomic History  . Marital status: Divorced    Spouse name: Not on file  . Number of children: Not on file  . Years of education: Not on file  . Highest education level: Not on file  Occupational History  . Occupation: Dealer  . Occupation: Painter  Tobacco Use  . Smoking status: Former Smoker    Packs/day: 1.50    Years: 40.00    Pack years: 60.00    Types: Cigarettes    Quit date: 2014    Years since quitting: 7.5  . Smokeless tobacco: Current User    Types: Chew  Vaping Use  . Vaping Use: Never used  Substance and Sexual Activity  . Alcohol use: Yes    Alcohol/week: 28.0 standard drinks    Types: 28 Cans of beer per week    Comment: 4 beers a night; + jitters with not drinking,  denies DTs or seizures  . Drug use: Yes    Types: Marijuana    Comment: daily; quit using crack and methamphetamine about 5 months ago   . Sexual activity: Not Currently    Partners: Female  Other Topics Concern  . Not on file  Social History Narrative   Lives alone near [place]   Social Determinants of Health   Financial Resource Strain: High Risk  . Difficulty of Paying Living Expenses: Hard  Food Insecurity: Unknown  . Worried About Charity fundraiser in the Last Year: Not on file  . Ran Out of Food in the Last Year: Never true  Transportation Needs: No Transportation Needs  . Lack of Transportation (Medical): No  . Lack of Transportation (Non-Medical): No  Physical Activity: Inactive  . Days of Exercise per Week: 0 days  . Minutes of Exercise per Session: 0 min  Stress: Stress Concern Present  . Feeling of Stress : To some extent  Social Connections: Moderately Integrated  . Frequency of Communication with Friends and Family: Three times a week  . Frequency of Social Gatherings with Friends and Family: Once a week  . Attends Religious Services: 1 to 4 times per year  . Active Member of Clubs or Organizations: Yes  . Attends Archivist Meetings: 1 to 4 times per year  . Marital Status: Divorced  Human resources officer Violence:   . Fear of Current or Ex-Partner:   . Emotionally Abused:   [name] Physically Abused:   . Sexually Abused:     Physical Exam      Future Appointments  Date Time Provider [place]  08/23/2019  8:30 AM James Stain, MD [facility] None    BP (!) 142/80   Pulse 96   Temp 98.7 F (37.1 C)   Resp 18   SpO2 97%   Pt reports he has good days and bad days. He is using his 02.  He is paying his own power bill now--but it is still in James [name] name-he said he doesn't get the bill himself but James [name] showed him how much it was and he paid James [name].  I will need to speak to his sister James [name] about this. However when I spoke to her about  these issues she said there was some altercations between the sisters and she no longer is being a part of his financials or personals.  His award letter states he will get $529.34 beginning on 8/1.  He got a letter to call this number to apply for food stamps.  He states breathing is bad some days and some days its ok.  I have told him he works too much and does too strenuous of work for his lung conditions.  He has been started on metformin and toprol at his last PCP visit.  He  threw away some bottles that said no refills left on it b/c he thought he didn't need to keep taking it. He doesn't have the bottle to losartan, folic acid, thiamine  He said he thought the metoprolol replaced losartan. I told him the doc wants him to do both.  I will send message to jane since I am off work tomor and that its after hours to see if she can get pharmacy to fill for him.   [name], [place] [facility] Paramedic  08/09/19

## 2019-08-14 ENCOUNTER — Telehealth: Payer: Self-pay | Admitting: Critical Care Medicine

## 2019-08-14 ENCOUNTER — Ambulatory Visit: Payer: Self-pay | Admitting: *Deleted

## 2019-08-14 MED ORDER — TRUE METRIX METER W/DEVICE KIT: PACK | 0 refills | Status: DC

## 2019-08-14 MED ORDER — TRUEPLUS LANCETS 28G MISC
1 refills | Status: DC
Start: 2019-08-14 — End: 2019-08-15

## 2019-08-14 MED ORDER — TRUE METRIX BLOOD GLUCOSE TEST VI STRP: ORAL_STRIP | 12 refills | Status: DC

## 2019-08-14 NOTE — Telephone Encounter (Signed)
Let sister know Rx written

## 2019-08-14 NOTE — Telephone Encounter (Signed)
Copied from [place] [id number]. Topic: General - Other >> Aug 14, 2019 10:50 AM Leward Quan A wrote: Reason for CRM: Patient sister [name] called to request an Rx written for Glucometer and test strips from [name] so that they can pick it up. Will be getting help with obtaining this product Please call when Rx is ready Ph# [phone]

## 2019-08-15 ENCOUNTER — Telehealth: Payer: Self-pay

## 2019-08-15 ENCOUNTER — Other Ambulatory Visit: Payer: Self-pay | Admitting: Pharmacist

## 2019-08-15 MED ORDER — ACCU-CHEK SOFTCLIX LANCETS MISC: 2 refills | Status: DC

## 2019-08-15 MED FILL — ACCU-CHEK SOFTCLIX LANCETS: 34 days supply | Qty: 100 | Fill #0

## 2019-08-15 MED FILL — ACCU-CHEK GUIDE TEST STRIP: 34 days supply | Qty: 100 | Fill #0

## 2019-08-15 MED FILL — ACCU-CHEK GUIDE ME W/DEVICE: W/DEVICE | 1 days supply | Qty: 1 | Fill #0

## 2019-08-15 NOTE — Telephone Encounter (Signed)
Call placed to patient's sister, James [name] to inform her that [name] placed the order for the glucometer at [facility]. Message left with call back requested to this CM # [phone]

## 2019-08-23 ENCOUNTER — Other Ambulatory Visit: Payer: Self-pay | Admitting: Pharmacist

## 2019-08-23 ENCOUNTER — Other Ambulatory Visit: Payer: Self-pay

## 2019-08-23 ENCOUNTER — Ambulatory Visit: Payer: Medicaid Other | Attending: Critical Care Medicine | Admitting: Critical Care Medicine

## 2019-08-23 ENCOUNTER — Encounter: Payer: Self-pay | Admitting: Critical Care Medicine

## 2019-08-23 ENCOUNTER — Ambulatory Visit: Payer: Self-pay | Admitting: *Deleted

## 2019-08-23 VITALS — BP 160/90 | HR 92 | Temp 97.5°F | Resp 24 | Wt 191.0 lb

## 2019-08-23 DIAGNOSIS — J441 Chronic obstructive pulmonary disease with (acute) exacerbation: Secondary | ICD-10-CM | POA: Diagnosis not present

## 2019-08-23 DIAGNOSIS — Z1211 Encounter for screening for malignant neoplasm of colon: Secondary | ICD-10-CM | POA: Diagnosis not present

## 2019-08-23 DIAGNOSIS — I10 Essential (primary) hypertension: Secondary | ICD-10-CM

## 2019-08-23 DIAGNOSIS — E119 Type 2 diabetes mellitus without complications: Secondary | ICD-10-CM

## 2019-08-23 DIAGNOSIS — F79 Unspecified intellectual disabilities: Secondary | ICD-10-CM

## 2019-08-23 DIAGNOSIS — J9611 Chronic respiratory failure with hypoxia: Secondary | ICD-10-CM

## 2019-08-23 DIAGNOSIS — J449 Chronic obstructive pulmonary disease, unspecified: Secondary | ICD-10-CM

## 2019-08-23 DIAGNOSIS — L2089 Other atopic dermatitis: Secondary | ICD-10-CM

## 2019-08-23 MED ORDER — PANTOPRAZOLE SODIUM 40 MG PO TBEC: 40.0000 mg | DELAYED_RELEASE_TABLET | Freq: Every day | ORAL | 6 refills | Status: DC

## 2019-08-23 MED ORDER — MOMETASONE FURO-FORMOTEROL FUM 200-5 MCG/ACT IN AERO: 2.0000 | INHALATION_SPRAY | Freq: Two times a day (BID) | RESPIRATORY_TRACT | 0 refills | Status: DC

## 2019-08-23 MED ORDER — TRIAMCINOLONE ACETONIDE 0.1 % EX CREA: 1.0000 "application " | TOPICAL_CREAM | Freq: Two times a day (BID) | CUTANEOUS | 0 refills | Status: DC

## 2019-08-23 MED ORDER — METFORMIN HCL 500 MG PO TABS: 500.0000 mg | ORAL_TABLET | Freq: Two times a day (BID) | ORAL | Status: DC

## 2019-08-23 MED ORDER — ALBUTEROL SULFATE HFA 108 (90 BASE) MCG/ACT IN AERS: 2.0000 | INHALATION_SPRAY | Freq: Four times a day (QID) | RESPIRATORY_TRACT | 2 refills | Status: DC | PRN

## 2019-08-23 MED ORDER — AZITHROMYCIN 250 MG PO TABS: ORAL_TABLET | ORAL | 0 refills | Status: DC

## 2019-08-23 MED ORDER — PREDNISONE 20 MG PO TABS: 20.0000 mg | ORAL_TABLET | Freq: Every day | ORAL | 1 refills | Status: DC

## 2019-08-23 MED ORDER — FOLIC ACID 1 MG PO TABS: 1.0000 mg | ORAL_TABLET | Freq: Every day | ORAL | 0 refills | Status: DC

## 2019-08-23 MED ORDER — METHYLPREDNISOLONE ACETATE 80 MG/ML IJ SUSP
80.0000 mg | Freq: Once | INTRAMUSCULAR | Status: AC
  Administered 2019-08-23: 80 mg via INTRAMUSCULAR

## 2019-08-23 MED ORDER — VITAMIN B-1 100 MG PO TABS: 100.0000 mg | ORAL_TABLET | Freq: Every day | ORAL | 3 refills | Status: DC

## 2019-08-23 MED ORDER — LOSARTAN POTASSIUM 100 MG PO TABS: 100.0000 mg | ORAL_TABLET | Freq: Every day | ORAL | 2 refills | Status: DC

## 2019-08-23 MED ORDER — SPIRIVA RESPIMAT 2.5 MCG/ACT IN AERS: INHALATION_SPRAY | RESPIRATORY_TRACT | 11 refills | Status: DC

## 2019-08-23 MED ORDER — ALBUTEROL SULFATE HFA 108 (90 BASE) MCG/ACT IN AERS: 2.0000 | INHALATION_SPRAY | Freq: Four times a day (QID) | RESPIRATORY_TRACT | 0 refills | Status: DC | PRN

## 2019-08-23 MED FILL — PANTOPRAZOLE SOD DR 40 MG T: 40 | 90 days supply | Qty: 90 | Fill #0

## 2019-08-23 MED FILL — predniSONE 20 MG TABS: 20 | 22 days supply | Qty: 60 | Fill #0

## 2019-08-23 MED FILL — TRIAMCINOLONE ACETONIDE 0.1: 0.1 | 15 days supply | Qty: 30 | Fill #0

## 2019-08-23 MED FILL — FOLIC ACID 1 MG TABS: 1 | 90 days supply | Qty: 90 | Fill #0

## 2019-08-23 MED FILL — PROAIR HFA 90 MCG INHALER: 108 (90 BAS | 25 days supply | Qty: 9 | Fill #0

## 2019-08-23 MED FILL — LOSARTAN POTASSIUM 100 MG T: 100 | 90 days supply | Qty: 90 | Fill #0

## 2019-08-23 MED FILL — SPIRIVA RESPIMAT 2.5 MCG IN: 2.5 | 90 days supply | Qty: 12 | Fill #0

## 2019-08-23 MED FILL — DULERA 200 MCG/5 MCG INH: 200-5 | 30 days supply | Qty: 13 | Fill #0

## 2019-08-23 MED FILL — AZITHROMYCIN 250 MG TABLET: 250 | 6 days supply | Qty: 6 | Fill #0

## 2019-08-23 NOTE — Assessment & Plan Note (Addendum)
Will resume resume steroid cream topically

## 2019-08-23 NOTE — Progress Notes (Signed)
Needs RF on medications- Spiriva, Protonix, losartan  Follow up

## 2019-08-23 NOTE — Patient Instructions (Signed)
A Depo-Medrol injection was given Begin azithromycin for 5 days Continue prednisone 20 mg daily Refills on all your medications were given A stool card will be given to you to check for blood in your stool as a screening measure for colon cancer  Your blood pressure is elevated today due to being out of losartan this was refilled  Increase your Metformin to 500 mg twice daily because your blood sugar is elevated due to being on chronic prednisone  Return to see [name] in 1 month  Please obtain a Covid vaccine COVID-19 Vaccine Information can be found at: ShippingScam.co.uk For questions related to vaccine distribution or appointments, please email [email or web address] or call [phone].     Please abstain from alcohol and resume your thiamine and folic acid

## 2019-08-23 NOTE — Assessment & Plan Note (Signed)
Diabetes type 2 worsen with prednisone use Increase Metformin to 500 mg twice daily

## 2019-08-23 NOTE — Assessment & Plan Note (Signed)
Recurrent  COPD exacerbation  Plan azithromycin for 5 days intramuscular Depo-Medrol 80 mg continue prednisone 20 mg a day and refill all inhalers particular the Spiriva which just ran out

## 2019-08-23 NOTE — Assessment & Plan Note (Signed)
Hypertension poorly controlled continue current program

## 2019-08-23 NOTE — Progress Notes (Signed)
Subjective:    Patient ID: James [name], male    DOB: June 06, 1960, 58 y.o.   MRN: [id number]  59 y.o.M with advanced Copd   I saw this patient a week ago and prescribed antibiotics and prednisone for what I perceived to be persisting pneumonia.  The patient had a slight improvement but then worsened and came to the emergency room the end of the week and was admitted for 3 days for COPD exacerbation and lower lobe pneumonia.  Chest x-ray did show persistent infiltrates in the lower lobes.  The patient is now discharged and returns to the office for post hospital follow-up.  Below is the discharge summary Dc summary : Admit date: 07/31/2018 Discharge date: 08/01/2018  Time spent: 45 minutes  Recommendations for Outpatient Follow-up:  Patient will be discharged to home.  Patient will need to follow up with primary care provider within one week of discharge.  Patient should continue medications as prescribed.  Patient should follow a heart healthy diet.   Discharge Diagnoses:  Acute on chronic respiratory failure with hypoxia secondary to COPD Exacerbation and pneumonia Essential hypertension  Discharge Condition: Stable  Diet recommendation: heart healthy  Filed Weights  07/31/18 0231 07/31/18 0602 Weight: 85.3 kg 83.4 kg   History of present illness:  on 07/31/2018 by Dr. Candie Mile [name] a 59 y.o.malewith medical history significant ofCOPD. Patient has had 3 prior admits for COPD / PNA thus far this year, most recently in May.  Patient has been being treated for COPD exacerbation and BLL CAP as outpatient by Dr. Joya [name] with levaquin and prednisone. Just finished levaquin yesterday. Prednisone remained at 56m daily with plans to initiate a slow taper.  Despite this, patient developed severe SOB and respiratory distress this evening. EMS called and patient noted to be satting 80% on RA. He is not on chronic O2 for his COPD.  Hospital Course:    Acute on chronic respiratory failure with hypoxia secondary to COPD Exacerbation and pneumonia -Patient noted to have oxygen saturations in the 80s on room air -Chest x-ray noted for pneumonia -Was recently placed on Levaquin and steroids by PCP approximately 1 week ago -On admission, patient continued to have wheezing well into his hospital stay day 1.  Patient currently still has some wheezing however has improved. -Was placed on Solu-Medrol, along with nebulizer treatments, azithromycin and ceftriaxone -Patient did require oxygen upon admission however has been able to ambulate and maintain oxygen saturations in the high 90s on room air. -Will discharge patient with prednisone taper, azithromycin and Ceftin. -Given that patient had oxygen saturations in the 80s on admission, it was thought that he would require several days in the hospital. Patient actually improved quicker than expected.  Essential hypertension -Continue losartan  Since discharge the patient is tried to go back to work but has extreme difficulty in the heat with this.  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  10/05/2018 This patient's not been seen since the end of July he had failed several follow-up visits.  He did see Dr. WShyrl Numbersend of August who did not make major changes in his  medication program.  He is to remain on [name] and Incruse.  The patient after being admitted in July got readmitted early August and then again in early September.  Discharge summaries are as noted below.  During the August admission the patient underwent bronchoscopy and had mucous plugging removed the culture showed normal  flora Note this patient has not been actively smoking recently Admit date: 08/09/2018 Discharge date: 08/11/2018  Discharge Diagnoses:  Acute on chronic hypoxic respiratory failure/sepsis due to multifocal pneumonia COPD Alcohol/marijuana use Essential hypertension  History of present illness:  on 08/09/2018 by Dr. [name] a 59 y.o.malewith medical history significant ofCOPD and ETOH dependence presenting with respiratory distress.He woke up about 530 this AM and couldn't breathe. This just keeps happening. He felt similarly when hospitalized last week. He has been doing fine until today. No h/o OSA, not on CPAP. Not on home O2. +cough, productive of yellowish sputum. It is sometimes hard to get the sputum up. No fever. +wheezing since his last hospitalization. Quit smoking in 2014. He lives in a non-air-conditioned log cabin.  He was hospitalized from 7/27-28 for acute on chronic respiratory failure associated with COPD exacerbation and PNA. This was his 4th admission this year. He was seen by Dr. Joya [name] on 7/30 with plan for completion of course of Keflex and prednisone, as well as prednisone taper.  Hospital Course:  Acute on chronic hypoxic respiratory failure/sepsis due to multifocal pneumonia -Was recently hospitalized in late July for pneumonia and COPD exacerbation. Was discharged with antibiotics as well as prednisone taper. Patient followed up with Dr. Joya [name] on 08/03/2018 with completion of Keflex and prednisone as well. -This is his fourth admission this year for similar presentation -Patient presented with cough and shortness of breath -Chest x-rayon admissionfindings consistent with right-sided multifocal pneumonia -CTA chest showed multifocal pneumonia on the right. COPD including emphysema. Negative for PE. -COVID negative -Upon admission, patient had leukocytosis, fever, tachycardia, tachypnea- all resolving  -Noted to be  hypoxic 88% on room air in the ED -Urine strep pneumoniaand legionellaantigensnegative -Sputum culture: Few GPC, GVR, rare yeast -Blood cultures show no growth -was placed on vancomycin, Zosyn, Solu-Medrol, supplemental oxygen -S/p bronchoscopy 8/6, pending culture results -CXR obtained after bronch on 8/6: almost complete clearing of the hazy infiltrates in the right lung -Discussed with Dr. Tamala Julian (pulm), recommended prednisone taper starting with prednisone 60m daily x 1 week, taper as an outpatient, along with Augmentin. He will follow up with the patient in one week. Given that infection cleared on xray so quickly, question if this is inflammatory vs infection.  -patient able to ambulate today without oxygen and maintained oxygen saturations in the mid 90s- therefore patient does not qualify for home oxygen -patient also had a swallow eval- mild aspiration risk  COPD -Continue nebulizer treatments, steroids, inhalers -Incentive spirometry, flutter valve  Alcohol/marijuana use -Discussed cessation -was placed on continue CIWA protocol  Essential hypertension -Continue Cozaar  Consultants PCCM- pulmonology   Procedures Bronchoscopy   Recommendations for Outpatient Follow-up:  1. Follow up with PCP in 1-2 weeks 2. Please obtain BMP/CBC in one week your next doctors visit.  3. Augmentin and azithromycin for 5 days 4. Advised to continue using bronchodilators every 4-6 hours for the next 4-5 days and then transition to as needed. 5. Advised to take his home prednisone, for next 3 days advised to increase it to 40 mg twice daily then resume back is 20 mg daily  Discharge Condition: Stable CODE  STATUS: Full code Diet recommendation: 2 g salt   DC summary from 09/11/18  Adm 9/7  D/C 09/13/18 Brief/Interim Summary: James [name] a 59 y.o.malewith history ofCOPD on prednisone, HTN, alcohol use disorder, marijuana use,tobacco user. Patient presented secondary to  dyspnea and found to have evidence of pneumonia and a COPD exacerbation in addition to sepsis physiology on admission.  There is concerns of possible right lower lobe pneumonia therefore started on Unasyn and azithromycin due to concerns of aspiration.  This was transitioned to Augmentin and azithromycin for total of 5 days at time of discharge.  Secondary to infection, he was having COPD exacerbation therefore on bronchodilators.  He started improving and on the day of discharge he was adamant about going home as he was feeling much better.  He was still having abnormal breath sounds and cough therefore I had requested him to stay another day in the hospital but still wanted to go home.  Instructions were given to him take 40 mg of prednisone for next 3 days and then he can resume back his 20 mg daily.  Advised... home bronchodilators periodically every 4-6 hours for next 4-5 days and then transition to as needed. Advised to follow-up with his PCP in about 1 week.   Discharge Diagnoses:  Active Problems:   Hypertension   COPD exacerbation ([place])   Marijuana abuse   Pneumonia   Sepsis ([place])  Acute respiratory distress secondary to right lower lobe pneumonia, community-acquired/aspiration Acute mild exacerbation of COPD Concern for possible aspiration.  Unasyn and azithromycin transition to Augmentin and azithromycin.  We will give him 5 more day course at the time of discharge 40 mg of prednisone for next 3 days then transition to 20 mg daily which is his home dose Advised to use bronchodilators.3  Alcohol use disorder Counseled to quit drinking alcohol.  Advised to continue thiamine, folate and multivitamin  Weakness -He did well with physical therapy.  Tobacco use Marijuana use Cessation discussed on admission  Consultations:  None  Subjective: Still having some cough and congestion but he is adamant about going home today as he is feeling much better than from admission.  I  recommended he stay in the hospital for at least 1 more day to help him feel even better.  He wished to go home. Ambulating in the hallway without additional saturating greater than 90% on room air.   Since discharge this patient went to urgent care on 21 September with an outbreak of shingles over his left lower back.  He is taken a course of valacyclovir and prednisone.  He has severe pain.  He was given gabapentin but this caused hallucinations and then we tried tramadol but this caused severe nausea and vomiting.  Apparently the patient cannot take Tylenol or nonsteroidal anti-inflammatories as well.  The patient states his breathing is been labored.  He still has productive cough of white mucus.  He has no fever.  He has significant back pain because of the shingles. The patient is maintaining the [facility] and Incruse as well as his antihypertensive medications and vitamins     10/12/2018  This patient is seen in return follow-up for acute shingles with postherpetic neuropathy, recent pneumonia, COPD exacerbation, and hypertension.  Patient is markedly improved from the last visit.  He has less cough less shortness of breath.  He has improved pain around the area of the shingles.  He is taking amitriptyline 25 mg at bedtime  The patient notes less pain.  He does state he had been using the tramadol without nausea or vomiting.  I asked if patient would be interested in the para medicine program and expressed interest in the community para medicine program  11/23/2018 Patient seen in return follow-up and has noted increasing shortness of breath and we had to resume prednisone this patient.  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  He does maintain his inhalers.  He is drinking 4 beers a night.  He is not smoking.  Note the patient continues to live in a home that has standing water under the foundation and in several the rooms and mold on the carpet.  We have connected this patient with legal  aid to see if they can assist him in getting into better housing.  The patient's not had previous allergy testing   12/13/2018 The patient notes increased shortness of breath, wheezing and cough productive of thick yellow mucus.  He is using the flutter valve but is not being is productive with this.  He denies chest pain or fever.  He is down to 2 a day on the prednisone.  He is maintaining his inhalers. Apparently there is repair is being done on the patient's home and he is decided to stay in the home  02/27/2019 This patient is seen in return follow-up for COPD.  The patient has significant mold exposure in the home.  There has been some mitigation of this but is not complete.  The patient just was in the urgent care for COPD exacerbation a week ago treated with a course of antibiotics and prednisone.  He is now off prednisone. Patient states the pain in the back has resolved however he is still having increased shortness of breath.  He still having productive cough of thick yellow mucus.  05/01/2019  Since the last OV the patient has been admitted for Copd flare again. Below is the discharge summary Dc summary:  Admit Date: 04/23/2019 Discharge date: 04/27/2019  Recommendations for Outpatient Follow-up:  1. Follow up with PCP in 1-2 weeks 2. Please obtain CMP/CBC in one week 3. Please repeat chest x-ray in the next 6 to 8 weeks to document resolution of infiltrates, if not may require further work-up.     Brief Narrative: Patient is a48 y.o.malewith history of COPD, intellectual disability, HTN who presented with shortness of breath-found to have acute hypoxic respiratory failure secondary to pneumonia and COPD exacerbation. Patient initially required BiPAP on admission. See below for further details.  Significant events: 4/19>> admit to [facility] for severe hypoxemia requiring BiPAP 4/20>> liberated from BiPAP  Antimicrobial therapy: Rocephin 4/18>>4/23 Zithromax  4/18>>4/22  Microbiology data: Blood culture: 4/19>> negative  Procedures : None  Consults: None  Brief Hospital Course: Acute hypoxic/hypercarbic respiratory failure secondary to COPD exacerbation and pneumonia:Improved-initially required BiPAP on admission-titrated off BiPAP on 4/20.Now on room air with stable O2 saturations.  Feels much better-treated with Rocephin and Zithromax-we will transition to [facility] for a few more days on discharge.  He will resume his usual inhaler regimen-he will be placed on 20 mg of p.o. prednisone until he is seen by Dr. Joya [name].  PCP to repeat a chest x-ray in 6 to 8 weeks to document resolution of infiltrates-if not he may require further work-up to rule out underlying malignancy.  (Per patient's sister-patient probably has been taking more prednisone than he should be-he is unable to read-and is not able to follow directions)  HTN: BPstable-continue losartan  EtOH abuse:No signs of withdrawal-managed with  Ativan  per protocol. Per patient's sister-he drinks around 2-3 bottles of beer on a daily basis.  Intellectual disability:Spoke at length with patient's sister ([name]) today-patient isa illiterate-and has been living on his own for all his life. His other sister lives a [place] and checks on him daily. Sister acknowledges poor living conditions-but apparently this is the way that patient has lived all his life-and chooses to live this way. 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. Patient is currently managing all his finances. [name] does acknowledge that patient will not let any home health services come to his house-he will not go to SNF. Difficult situation-but even though patient is an illiterate/intellectual disability-he understand that he is somewhat deconditioned from his acute illness-and is in need of some help-he does not want therapy services-he understands the risk of falls from being weak.  This MD had numerous  discussions with him regarding his appropriate disposition-all he wants to do is go home-his sister-[name] wants to have him come to her place in [place], [place] for a few weeks-however the patient really does not want to go there-and wants to go home.  I have discussed at length with social worker as well-at this point he is being discharged home at his own request.  His family will keep an eye on him.   Nutrition Problem: Nutrition Problem: Increased nutrient needs Etiology: chronic illness(COPD) Signs/Symptoms: estimated needs Interventions: Ensure Enlive (each supplement provides 350kcal and 20 grams of protein)  Discharge Diagnoses:  Principal Problem:   Acute on chronic respiratory failure with hypoxia and hypercapnia (HCC) Active Problems:   Hypertension   COPD exacerbation ([place])   Community acquired pneumonia of right lower lobe of lung   Marijuana abuse   Intellectual disability  Since discharge the patient has completed his antibiotics for his right lower lobe pneumonia.  He is on tapering prednisone.  He has developed a rash on his arms and face.  The rash is pruritic in nature.  He has been maintaining his medications as prescribed.   07/23/2019 Since the last visit this patient was readmitted to the hospital between 30 June and 3 July.  Below is a copy of the discharge summary Discharge Diagnosis: 1. Acute hypoxic respiratory 2/2 COPD exacerbation vs pneumonitis 2. GERD 3. EtOH history 4. HTN  Discharge Medications: Allergies as of 07/07/2019     Reactions  Gabapentin Other (See Comments)  hallucinations  Disposition and follow-up:   [name] was discharged from [facility] in Stable condition.  At the hospital follow up visit please address:  1.  COPD exacerbation: Continue daily Albuterol, Dulera, Spiriva, and prednisone 40 mg daily. Sent home with 1 additional day of azithromycin. Discharged on 2L [place]. F/u allergy labs.    GERD: Severe reflux noted on esophagram, started on pantoprazole daily, may need GI follow up.   2.  Labs / imaging needed at time of follow-up: None  3.  Pending labs/ test needing follow-up: IgE, allergen panel for mold, hypersensitivity pneumonitis  Note since discharge the patient's breathing is back to baseline.  The patient does have some wheezing and minimal productive cough.  Noticed blood pressures ranged up at this visit.  He is maintaining Dulera Spiriva as before he has been maintained on prednisone 40 mg daily and also discharged on 2 L oxygen.  He no longer is actively working at this time.  Note his mold assay did show a mild hypersensitivity to some yeast.  08/23/2019 Patient returns today in  follow-up showing increased shortness of breath.  He continues to work outdoors in the heat and this is exacerbating his condition despite being on 20 mg of prednisone daily.  He notes thick yellow mucus that he is coughing up.  He notes increased wheeze.  He has ran out of his Spiriva          Shortness of Breath This is a chronic problem. The current episode started more than 1 year ago. The problem occurs daily (qhs shortness of breath). The problem has been gradually improving. Associated symptoms include PND, sputum production and wheezing. Pertinent negatives include no chest pain, claudication, fever, headaches, hemoptysis, leg pain, leg swelling, orthopnea, rash, sore throat, syncope or vomiting. The symptoms are aggravated by any activity and weather changes. Associated symptoms comments: Pain radiates to arm Legs go to sleep No dysphagia No gerd . Risk factors include smoking. He has tried beta agonist inhalers, oral steroids and steroid inhalers for the symptoms. The treatment provided moderate relief. His past medical history is significant for COPD and pneumonia. There is no history of asthma, CAD, DVT, a heart failure or PE.    Past Medical History:  Diagnosis Date  .  Acute on chronic respiratory failure with hypoxia ([place]) 06/08/2013  . CAP (community acquired pneumonia) 05/17/2018   See admit 05/17/18 ? rml  with   covid pcr neg - rx augmentin > f/u cxr in 4-6 weeks is fine unless condition declines   . Community acquired pneumonia of right lower lobe of lung 05/17/2018   See admit 05/17/18 ? rml  with   covid pcr neg - rx augmentin > f/u cxr in 4-6 weeks is fine unless condition declines   . COPD (chronic obstructive pulmonary disease) ([place])    not on home  . Dyspnea   . Hypertension   . Pneumonia 04/06/2016  . Pneumothorax    2016, fell from ladder  . 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 Tick bite 06/03/2018     Family History  Problem Relation Age of Onset  . Heart disease Father   . COPD Sister      Social History   Socioeconomic History  . Marital status: Divorced    Spouse name: Not on file  . Number of children: Not on file  . Years of education: Not on file  . Highest education level: Not on file  Occupational History  . Occupation: Dealer  . Occupation: Painter  Tobacco Use  . Smoking status: Former Smoker    Packs/day: 1.50    Years: 40.00    Pack years: 60.00    Types: Cigarettes    Quit date: 2014    Years since quitting: 7.6  . Smokeless tobacco: Current User    Types: Chew  Vaping Use  . Vaping Use: Never used  Substance and Sexual Activity  . Alcohol use: Yes    Alcohol/week: 28.0 standard drinks    Types: 28 Cans of beer per week    Comment: 4 beers a night; + jitters with not drinking, denies DTs or seizures  . Drug use: Yes    Types: Marijuana    Comment: daily; quit using crack and methamphetamine about 5 months ago   . Sexual activity: Not Currently    Partners: Female  Other Topics Concern  . Not on file  Social History Narrative   Lives alone [place]   Social Determinants of Health   Financial Resource Strain: High Risk  . Difficulty of Paying Living  Expenses: Hard  Food Insecurity:  Unknown  . Worried About Charity fundraiser in the Last Year: Not on file  . Ran Out of Food in the Last Year: Never true  Transportation Needs: No Transportation Needs  . Lack of Transportation (Medical): No  . Lack of Transportation (Non-Medical): No  Physical Activity: Inactive  . Days of Exercise per Week: 0 days  . Minutes of Exercise per Session: 0 min  Stress: Stress Concern Present  . Feeling of Stress : To some extent  Social Connections: Moderately Integrated  . Frequency of Communication with Friends and Family: Three times a week  . Frequency of Social Gatherings with Friends and Family: Once a week  . Attends Religious Services: 1 to 4 times per year  . Active Member of Clubs or Organizations: Yes  . Attends Archivist Meetings: 1 to 4 times per year  . Marital Status: Divorced  Human resources officer Violence:   . Fear of Current or Ex-Partner: Not on file  . Emotionally Abused: Not on file  . Physically Abused: Not on file  . Sexually Abused: Not on file     Allergies  Allergen Reactions  . Gabapentin Other (See Comments)    hallucinations     Outpatient Medications Prior to Visit  Medication Sig Dispense Refill  . albuterol (PROVENTIL) (2.5 MG/3ML) 0.083% nebulizer solution Take 3 mLs (2.5 mg total) by nebulization every 6 (six) hours as needed for wheezing or shortness of breath. 150 mL 0  . metoprolol succinate (TOPROL-XL) 25 MG 24 hr tablet Take 1 tablet (25 mg total) by mouth daily. 90 tablet 3  . albuterol (VENTOLIN HFA) 108 (90 Base) MCG/ACT inhaler Inhale 2 puffs into the lungs every 6 (six) hours as needed for wheezing or shortness of breath. 6.7 g 0  . metFORMIN (GLUCOPHAGE) 500 MG tablet Take 1 tablet (500 mg total) by mouth daily with breakfast. 60 tablet 3  . mometasone-formoterol (DULERA) 200-5 MCG/ACT AERO Inhale 2 puffs into the lungs 2 (two) times daily. 13 g 0  . predniSONE (DELTASONE) 20 MG tablet Take 4 tablets daily for 5 days then 3  tablets for 5 days then two tablets a day and stay 60 tablet 0  . triamcinolone cream (KENALOG) 0.1 % Apply 1 application topically 2 (two) times daily. 30 g 0  . Accu-Chek Softclix Lancets lancets Use as instructed to check blood sugar once daily. 100 each 2  . Blood Glucose Monitoring Suppl (TRUE METRIX METER) w/Device KIT Use to measure blood sugar twice a day 1 kit 0  . glucose blood (TRUE METRIX BLOOD GLUCOSE TEST) test strip Use as instructed 100 each 12  . Respiratory Therapy Supplies (FLUTTER) DEVI Use 4 times daily 1 each 0  . folic acid (FOLVITE) 1 MG tablet Take 1 tablet (1 mg total) by mouth daily. (Patient not taking: Reported on 08/23/2019) 90 tablet 0  . losartan (COZAAR) 100 MG tablet Take 1 tablet (100 mg total) by mouth daily. (Patient not taking: Reported on 08/09/2019) 30 tablet 2  . pantoprazole (PROTONIX) 40 MG tablet Take 1 tablet (40 mg total) by mouth daily. (Patient not taking: Reported on 08/23/2019) 30 tablet 0  . permethrin (ELIMITE) 5 % cream Apply 1 application topically once.    [name] Spacer/Aero-Holding Chambers (AEROCHAMBER MV) inhaler Use as instructed (Patient not taking: Reported on 08/23/2019) 1 each 0  . thiamine (VITAMIN B-1) 100 MG tablet Take 1 tablet (100 mg total) by mouth daily. (Patient  not taking: Reported on 08/09/2019) 30 tablet 3  . tiotropium (SPIRIVA HANDIHALER) 18 MCG inhalation capsule Place 1 capsule (18 mcg total) into inhaler and inhale daily. (Patient not taking: Reported on 08/23/2019) 30 capsule 0   No facility-administered medications prior to visit.     Review of Systems  Constitutional: Negative for fever.  HENT: Negative for sinus pressure, sinus pain, sore throat and trouble swallowing.   Eyes: Negative.   Respiratory: Positive for cough, sputum production, chest tightness, shortness of breath and wheezing. Negative for hemoptysis.   Cardiovascular: Positive for PND. Negative for chest pain, palpitations, orthopnea, claudication, leg  swelling and syncope.  Gastrointestinal: Negative for nausea and vomiting.  Genitourinary: Negative.   Musculoskeletal: Positive for back pain.  Skin: Negative for rash.  Neurological: Negative for headaches.  Psychiatric/Behavioral: Negative for hallucinations. The patient is not nervous/anxious and is not hyperactive.        Objective:   Physical Exam Vitals:   08/23/19 0853  BP: (!) 160/90  Pulse: 92  Resp: (!) 24  Temp: (!) 97.5 F (36.4 C)  SpO2: 95%  Weight: 191 lb (86.6 kg)    Gen: , well-nourished, in no distress,   ENT: No lesions,  mouth clear,  oropharynx clear, no postnasal drip  Neck: No JVD, no TMG, no carotid bruits  Lungs: No use of accessory muscles, no dullness to percussion, inspiratory and expiratory wheeze with poor airflow  Cardiovascular: RRR, heart sounds normal, no murmur or gallops, no peripheral edema  Abdomen: soft and NT, no HSM,  BS normal  Musculoskeletal: No deformities, no cyanosis or clubbing  Neuro: alert, non focal  Skin: Warm, rash over arms and face  Disability eval at [facility] PFTS:   Pulmonary Function Test (04/19/2019 1:42 PM EDT) Pulmonary Function Test (04/19/2019 1:42 PM EDT)  Component Value Ref Range Performed At Pathologist Signature  FVC Post 2.51 L [facility]   FEV1 Post 1.55 L [facility]   FEV1/FVC Post 61.87 % [facility]   FEF25-75% Post 0.90 L/s [facility]   PEF Post 3.42 L/s [facility]   MVV Post 42.00 L/min [facility]   FVC Pre 2.92 L [facility]   FEV1 Pre 1.57 L [facility]   FEV1/FVC Pre 54.01 % [facility]   FEF25-75% Pre 0.68 L/s [facility]   PEF Pre 4.14 L/s [facility]   MVV Pre 45.00 L/min [facility]   FVC_LLN 3.73  [facility]   FVC_Z-SCORE -2.84  [facility]   FVC_%PRED 60 % % [facility]   FVC_Z-SCORE -2.84  [facility]   FVC_%PRED 52 % % [facility]   FVC %Chng -14 % % [facility]   FEV1_LLN 2.86  [facility]   FEV1_Z-SCORE -3.78  [facility]   FEV1_%PRED 42 % % [facility]   FEV1_Z-SCORE -3.78  [facility]   FEV1_%PRED 41 % % [facility]   FEV1 %Chng -2 % % [facility]   FEV1/FVC_LLN 66  [facility]   FEV1/FVC %Chng 15 % % [facility]   FEF25-75%_LLN 1.60  [facility]   FEF25-75%_Z-SCORE -3.05  [facility]FT[facility]   FEF25-75%_%PRED 21 % %  [facility]   FEF25-75%_Z-SCORE -3.05  [facility]FT[facility]   FEF25-75%_%PRED 28 % % [facility]   FEF25-75% %Chng 33 % % [facility]   PEF_LLN 7.25  [facility]   PEF_%PRED 43 % 36 % % [facility]   PEF %Chng -17 % % [facility]   MVV_LLN 130  [facility]   MVV_%PRED 35 % 32 % % [facility]   MVV %Chng -7 % % [facility]     Media Information    Document Information  Photos    11/02/2018 14:43  Attached To:  [name]  Source Information  [name], MD  [facility]    2/15 CXR:/ABD NAD   BMP Latest Ref Rng & Units 07/23/2019 07/05/2019 07/04/2019  Glucose 65 - 99 mg/dL 152(H) 122(H) 148(H)  BUN 6 - 24 mg/dL _0 Creatinine 0.76 - 1.27 mg/dL 0.96 0.74 0.86  BUN/Creat Ratio 9 - 20 16 - -  Sodium 134 - 144 mmol/L 141 140 143  Potassium 3.5 - 5.2 mmol/L 4.2 4.1 4.3  Chloride 96 - 106 mmol/L 100 104 105  CO2 20 - 29 mmol/L _1 Calcium 8.7 - 10.2 mg/dL 9.4 8.9 9.2   Hepatic Function Panel     Component Value  Date/Time   PROT 5.9 (L) 07/23/2019 0900   ALBUMIN 4.0 07/23/2019 0900   AST 18 07/23/2019 0900   ALT 21 07/23/2019 0900   ALT 62 (H) 06/23/2016 1226   ALKPHOS 51 07/23/2019 0900   BILITOT <0.2 07/23/2019 0900   BILIDIR 0.1 06/20/2019 0619   BILIDIR 0.12 04/16/2016 1601   IBILI 0.6 06/20/2019 0619   CBC Latest Ref Rng & Units 07/23/2019 07/07/2019 07/06/2019  WBC 3.4 - 10.8 x10E3/uL 14.4(H) 15.0(H) 15.2(H)  Hemoglobin 13.0 - 17.7 g/dL 14.9 14.6 13.2  Hematocrit 37.5 - 51.0 % 44.3 44.0 40.5  Platelets 150 - 450 x10E3/uL 305 301 245       Assessment & Plan:  I personally reviewed all images and lab data in the [facility] system as well as any outside material available during this office visit and agree with the  radiology impressions.   Hypertension Hypertension poorly controlled continue current program  COPD exacerbation ([place]) Recurrent  COPD exacerbation  Plan azithromycin for 5 days intramuscular Depo-Medrol 80 mg continue prednisone 20 mg a day and refill all inhalers particular the Spiriva which just ran out   Type 2 diabetes mellitus without complication, without long-term current use of insulin (HCC) Diabetes type 2 worsen with prednisone use Increase Metformin to 500 mg twice daily  Other atopic dermatitis Will resume resume steroid cream topically   Diagnoses and all orders for this visit:  Colon cancer screening -     Fecal occult blood, imunochemical  Essential hypertension -     losartan (COZAAR) 100 MG tablet; Take 1 tablet (100 mg total) by mouth daily.  COPD exacerbation (HCC) -     methylPREDNISolone acetate (DEPO-MEDROL) injection 80 mg  Chronic respiratory failure with hypoxia (HCC)  COPD mixed type ([place])  Type 2 diabetes mellitus without complication, without long-term current use of insulin (HCC)  Intellectual disability  Other atopic dermatitis  Other orders -     pantoprazole (PROTONIX) 40 MG tablet; Take 1 tablet (40 mg total) by mouth  daily. -     Tiotropium  Bromide Monohydrate (SPIRIVA RESPIMAT) 2.5 MCG/ACT AERS; Two puff daily -     triamcinolone cream (KENALOG) 0.1 %; Apply 1 application topically 2 (two) times daily. -     folic acid (FOLVITE) 1 MG tablet; Take 1 tablet (1 mg total) by mouth daily. -     thiamine (VITAMIN B-1) 100 MG tablet; Take 1 tablet (100 mg total) by mouth daily. -     mometasone-formoterol (DULERA) 200-5 MCG/ACT AERO; Inhale 2 puffs into the lungs 2 (two) times daily. -     Discontinue: albuterol (VENTOLIN HFA) 108 (90 Base) MCG/ACT inhaler; Inhale 2 puffs into the lungs every 6 (six) hours as needed for wheezing or shortness of breath. -     metFORMIN (GLUCOPHAGE) 500 MG tablet; Take 1 tablet (500 mg total) by mouth 2 (two) times daily with a meal. -     predniSONE (DELTASONE) 20 MG tablet; Take 1 tablet (20 mg total) by mouth daily with breakfast. Take 4 tablets daily for 5 days then 3 tablets for 5 days then two tablets a day and stay -     azithromycin (ZITHROMAX) 250 MG tablet; Take two once then one daily until gone  Patient needs for fecal occult test

## 2019-08-29 ENCOUNTER — Other Ambulatory Visit (HOSPITAL_COMMUNITY): Payer: Self-pay

## 2019-08-29 NOTE — Progress Notes (Signed)
Paramedicine Encounter    Patient ID: James [name], male    DOB: Feb 23, 1960, 59 y.o.   MRN: [id number]   Patient Care Team: [name], MD as PCP - General (Pulmonary Disease) O'Neal, Cassie Freer, MD as PCP - Cardiology (Cardiology) Thana Ates, RN as [place] Management  Patient Active Problem List   Diagnosis Date Noted   Type 2 diabetes mellitus without complication, without long-term current use of insulin ([place]) 07/24/2019   Herpes simplex disease 07/23/2019   Other atopic dermatitis 05/01/2019   Chronic respiratory failure with hypoxia ([place]) 04/23/2019   Intellectual disability 04/23/2019   Allergy to mold 11/23/2018   Marijuana abuse 08/09/2018   COPD exacerbation ([place]) 02/16/2018   Hypertension 05/17/2016   Low back pain radiating to both legs 04/18/2016   History of tobacco abuse 04/06/2016   Alcohol dependence syndrome ([place]) 04/06/2016   COPD mixed type ([place]) 03/26/2016    Current Outpatient Medications:    albuterol (PROAIR HFA) 108 (90 Base) MCG/ACT inhaler, Inhale 2 puffs into the lungs every 6 (six) hours as needed for wheezing or shortness of breath., Disp: 8.5 g, Rfl: 2   albuterol (PROVENTIL) (2.5 MG/3ML) 0.083% nebulizer solution, Take 3 mLs (2.5 mg total) by nebulization every 6 (six) hours as needed for wheezing or shortness of breath., Disp: 696 mL, Rfl: 0   folic acid (FOLVITE) 1 MG tablet, Take 1 tablet (1 mg total) by mouth daily., Disp: 90 tablet, Rfl: 0   losartan (COZAAR) 100 MG tablet, Take 1 tablet (100 mg total) by mouth daily., Disp: 30 tablet, Rfl: 2   metFORMIN (GLUCOPHAGE) 500 MG tablet, Take 1 tablet (500 mg total) by mouth 2 (two) times daily with a meal., Disp: 120 tablet, Rfl: 01   metoprolol succinate (TOPROL-XL) 25 MG 24 hr tablet, Take 1 tablet (25 mg total) by mouth daily., Disp: 90 tablet, Rfl: 3   mometasone-formoterol (DULERA) 200-5 MCG/ACT AERO, Inhale 2 puffs into the lungs 2 (two)  times daily., Disp: 13 g, Rfl: 0   pantoprazole (PROTONIX) 40 MG tablet, Take 1 tablet (40 mg total) by mouth daily., Disp: 30 tablet, Rfl: 6   predniSONE (DELTASONE) 20 MG tablet, Take 1 tablet (20 mg total) by mouth daily with breakfast. Take 4 tablets daily for 5 days then 3 tablets for 5 days then two tablets a day and stay, Disp: 60 tablet, Rfl: 1   Respiratory Therapy Supplies (FLUTTER) DEVI, Use 4 times daily, Disp: 1 each, Rfl: 0   Tiotropium Bromide Monohydrate (SPIRIVA RESPIMAT) 2.5 MCG/ACT AERS, Two puff daily, Disp: 4 g, Rfl: 11   triamcinolone cream (KENALOG) 0.1 %, Apply 1 application topically 2 (two) times daily., Disp: 30 g, Rfl: 0   Accu-Chek Softclix Lancets lancets, Use as instructed to check blood sugar once daily. (Patient not taking: Reported on 08/29/2019), Disp: 100 each, Rfl: 2   azithromycin (ZITHROMAX) 250 MG tablet, Take two once then one daily until gone (Patient not taking: Reported on 08/29/2019), Disp: 6 tablet, Rfl: 0   Blood Glucose Monitoring Suppl (TRUE METRIX METER) w/Device KIT, Use to measure blood sugar twice a day (Patient not taking: Reported on 08/29/2019), Disp: 1 kit, Rfl: 0   glucose blood (TRUE METRIX BLOOD GLUCOSE TEST) test strip, Use as instructed (Patient not taking: Reported on 08/29/2019), Disp: 100 each, Rfl: 12   thiamine (VITAMIN B-1) 100 MG tablet, Take 1 tablet (100 mg total) by mouth daily. (Patient not taking: Reported on 08/29/2019), Disp:  30 tablet, Rfl: 3 Allergies  Allergen Reactions   Gabapentin Other (See Comments)    hallucinations      Social History   Socioeconomic History   Marital status: Divorced    Spouse name: Not on file   Number of children: Not on file   Years of education: Not on file   Highest education level: Not on file  Occupational History   Occupation: Dealer   Occupation: Painter  Tobacco Use   Smoking status: Former Smoker    Packs/day: 1.50    Years: 40.00    Pack years: 60.00     Types: Cigarettes    Quit date: 2014    Years since quitting: 7.6   Smokeless tobacco: Current User    Types: Chew  Vaping Use   Vaping Use: Never used  Substance and Sexual Activity   Alcohol use: Yes    Alcohol/week: 28.0 standard drinks    Types: 28 Cans of beer per week    Comment: 4 beers a night; + jitters with not drinking, denies DTs or seizures   Drug use: Yes    Types: Marijuana    Comment: daily; quit using crack and methamphetamine about 5 months ago    Sexual activity: Not Currently    Partners: Female  Other Topics Concern   Not on file  Social History Narrative   Lives alone near [place] Determinants of Health   Financial Resource Strain: High Risk   Difficulty of Paying Living Expenses: Hard  Food Insecurity: Unknown   Worried About Charity fundraiser in the Last Year: Not on file   [employer] of Food in the Last Year: Never true  Transportation Needs: No Transportation Needs   Lack of Transportation (Medical): No   Lack of Transportation (Non-Medical): No  Physical Activity: Inactive   Days of Exercise per Week: 0 days   Minutes of Exercise per Session: 0 min  Stress: Stress Concern Present   Feeling of Stress : To some extent  Social Connections: Moderately Integrated   Frequency of Communication with Friends and Family: Three times a week   Frequency of Social Gatherings with Friends and Family: Once a week   Attends Religious Services: 1 to 4 times per year   Active Member of [employer] or Organizations: Yes   Attends Archivist Meetings: 1 to 4 times per year   Marital Status: Divorced  Human resources officer Violence:    Fear of Current or Ex-Partner: Not on file   Emotionally Abused: Not on file   Physically Abused: Not on file   Sexually Abused: Not on file    Physical Exam      No future appointments.  BP 122/84    Pulse 100    Temp 98.9 F (37.2 C)    Resp (!) 22    SpO2 97%  CBG EMS-95 Pt reports  he has been feeling ok. He still working, has to take numerous breaks but still working. I have advised him he should not continue to work past 12 on these extreme hot days but he still does and he gets home around 5--that's when his appointments with me always are.  He has meter to check CBG's but he doesn't know how to use it. So that was shown to him.  He states his breathing is doing same [place] same ole.  Pt denies c/p, no dizziness. Not taking thiamine--needs to get from pharmacy.  He has the tremors really  bad.  He said the prednisone gives him the shakes.  B/p is much better.  CBG good.   He applied for food stamps.  Will f/u in a couple wks.   [name], [facility] Paramedic  08/29/19

## 2019-09-07 DIAGNOSIS — J9601 Acute respiratory failure with hypoxia: Secondary | ICD-10-CM | POA: Diagnosis not present

## 2019-09-07 DIAGNOSIS — J9621 Acute and chronic respiratory failure with hypoxia: Secondary | ICD-10-CM | POA: Diagnosis not present

## 2019-09-07 DIAGNOSIS — F79 Unspecified intellectual disabilities: Secondary | ICD-10-CM | POA: Diagnosis not present

## 2019-09-20 ENCOUNTER — Other Ambulatory Visit (HOSPITAL_COMMUNITY): Payer: Self-pay

## 2019-09-20 NOTE — Progress Notes (Signed)
Paramedicine Encounter    Patient ID: James [name], male    DOB: 11/14/1960, 59 y.o.   MRN: [id number]   Patient Care Team: Wright, Patrick E, MD as PCP - General (Pulmonary Disease) O'Neal, Glenbrook Thomas, MD as PCP - Cardiology (Cardiology) Cook, Amanda U, RN as [facility] Care Management  Patient Active Problem List   Diagnosis Date Noted  . Type 2 diabetes mellitus without complication, without long-term current use of insulin (HCC) 07/24/2019  . 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 Other atopic dermatitis 05/01/2019  . Chronic respiratory failure with hypoxia (HCC) 04/23/2019  . Intellectual disability 04/23/2019  . 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 Marijuana abuse 08/09/2018  . COPD exacerbation (HCC) 02/16/2018  . Hypertension 05/17/2016  . Low back pain radiating to both legs 04/18/2016  . History of tobacco abuse 04/06/2016  . Alcohol dependence syndrome (HCC) 04/06/2016  . COPD mixed type (HCC) 03/26/2016    Current Outpatient Medications:  .  Accu-Chek Softclix Lancets lancets, Use as instructed to check blood sugar once daily., Disp: 100 each, Rfl: 2 .  albuterol (PROAIR HFA) 108 (90 Base) MCG/ACT inhaler, Inhale 2 puffs into the lungs every 6 (six) hours as needed for wheezing or shortness of breath., Disp: 8.5 g, Rfl: 2 .  albuterol (PROVENTIL) (2.5 MG/3ML) 0.083% nebulizer solution, Take 3 mLs (2.5 mg total) by nebulization every 6 (six) hours as needed for wheezing or shortness of breath., Disp: 150 mL, Rfl: 0 .  folic acid (FOLVITE) 1 MG tablet, Take 1 tablet (1 mg total) by mouth daily., Disp: 90 tablet, Rfl: 0 .  glucose blood (TRUE METRIX BLOOD GLUCOSE TEST) test strip, Use as instructed, Disp: 100 each, Rfl: 12 .  losartan (COZAAR) 100 MG tablet, Take 1 tablet (100 mg total) by mouth daily., Disp: 30 tablet, Rfl: 2 .  metFORMIN (GLUCOPHAGE) 500 MG tablet, Take 1 tablet (500 mg total) by mouth 2 (two) times daily with a meal., Disp: 120 tablet, Rfl:  01 .  metoprolol succinate (TOPROL-XL) 25 MG 24 hr tablet, Take 1 tablet (25 mg total) by mouth daily., Disp: 90 tablet, Rfl: 3 .  mometasone-formoterol (DULERA) 200-5 MCG/ACT AERO, Inhale 2 puffs into the lungs 2 (two) times daily., Disp: 13 g, Rfl: 0 .  pantoprazole (PROTONIX) 40 MG tablet, Take 1 tablet (40 mg total) by mouth daily., Disp: 30 tablet, Rfl: 6 .  predniSONE (DELTASONE) 20 MG tablet, Take 1 tablet (20 mg total) by mouth daily with breakfast. Take 4 tablets daily for 5 days then 3 tablets for 5 days then two tablets a day and stay, Disp: 60 tablet, Rfl: 1 .  Respiratory Therapy Supplies (FLUTTER) DEVI, Use 4 times daily, Disp: 1 each, Rfl: 0 .  Tiotropium Bromide Monohydrate (SPIRIVA RESPIMAT) 2.5 MCG/ACT AERS, Two puff daily, Disp: 4 g, Rfl: 11 .  triamcinolone cream (KENALOG) 0.1 %, Apply 1 application topically 2 (two) times daily., Disp: 30 g, Rfl: 0 .  azithromycin (ZITHROMAX) 250 MG tablet, Take two once then one daily until gone (Patient not taking: Reported on 08/29/2019), Disp: 6 tablet, Rfl: 0 .  Blood Glucose Monitoring Suppl (TRUE METRIX METER) w/Device KIT, Use to measure blood sugar twice a day (Patient not taking: Reported on 08/29/2019), Disp: 1 kit, Rfl: 0 .  thiamine (VITAMIN B-1) 100 MG tablet, Take 1 tablet (100 mg total) by mouth daily. (Patient not taking: Reported on 08/29/2019), Disp: 30 tablet, Rfl: 3 Allergies  Allergen Reactions  . Gabapentin Other (  See Comments)    hallucinations      Social History   Socioeconomic History  . Marital status: Divorced    Spouse name: Not on file  . Number of children: Not on file  . Years of education: Not on file  . Highest education level: Not on file  Occupational History  . Occupation: Mechanic  . Occupation: Painter  Tobacco Use  . Smoking status: Former Smoker    Packs/day: 1.50    Years: 40.00    Pack years: 60.00    Types: Cigarettes    Quit date: 2014    Years since quitting: 7.7  . Smokeless  tobacco: Current User    Types: Chew  Vaping Use  . Vaping Use: Never used  Substance and Sexual Activity  . Alcohol use: Yes    Alcohol/week: 28.0 standard drinks    Types: 28 Cans of beer per week    Comment: 4 beers a night; + jitters with not drinking, denies DTs or seizures  . Drug use: Yes    Types: Marijuana    Comment: daily; quit using crack and methamphetamine about 5 months ago   . Sexual activity: Not Currently    Partners: Female  Other Topics Concern  . Not on file  Social History Narrative   Lives alone near [place]   Social Determinants of Health   Financial Resource Strain:   . Difficulty of Paying Living Expenses: Not on file  Food Insecurity:   . Worried About Running Out of Food in the Last Year: Not on file  . Ran Out of Food in the Last Year: Not on file  Transportation Needs:   . Lack of Transportation (Medical): Not on file  . Lack of Transportation (Non-Medical): Not on file  Physical Activity:   . Days of Exercise per Week: Not on file  . Minutes of Exercise per Session: Not on file  Stress:   . Feeling of Stress : Not on file  Social Connections:   . Frequency of Communication with Friends and Family: Not on file  . Frequency of Social Gatherings with Friends and Family: Not on file  . Attends Religious Services: Not on file  . Active Member of Clubs or Organizations: Not on file  . Attends Club or Organization Meetings: Not on file  . Marital Status: Not on file  Intimate Partner Violence:   . Fear of Current or Ex-Partner: Not on file  . Emotionally Abused: Not on file  . Physically Abused: Not on file  . Sexually Abused: Not on file    Physical Exam      No future appointments.  BP 128/78   Pulse 84   Temp 98.4 F (36.9 C)   Resp 18   SpO2 96%   CBG EMS-110  Pt reports yesterday was a rough day for him breathing wise, he said he wasn't working but I heard a lot of noise in the background when I spoke to him yesterday so  I think he was still out in the heat working.  Pt wanted me to review how to use glucometer again we I walked in thru that.  He reports being compliant with all meds.  meds verified.  He is going to call PCP to make appointment.  He states today he has felt much better, temp has been milder today and he can tell the difference and he knows that is something he has to work on.  Lungs clear.  No edema   noted.  At this point in time all the goals we have set out have been accomplished--he has received his SSI, food stamps, and his housing has improved as much as it can. He isnt going to leave the house he is in, as bad as we all want him to move he is not going anywhere.  So at this point he can be d/c from paramedicine. Will discuss with Jane as well.    , EMT-Paramedic [phone] [facility] Par[facility]ic  09/20/19   

## 2019-09-22 ENCOUNTER — Inpatient Hospital Stay (HOSPITAL_COMMUNITY)
Admission: EM | Admit: 2019-09-22 | Discharge: 2019-09-25 | DRG: 191 | Disposition: A | Discharge status: Home / Self Care | Payer: Medicaid Other | Attending: Internal Medicine | Admitting: Internal Medicine

## 2019-09-22 ENCOUNTER — Other Ambulatory Visit: Payer: Self-pay

## 2019-09-22 ENCOUNTER — Emergency Department (HOSPITAL_COMMUNITY): Payer: Medicaid Other

## 2019-09-22 ENCOUNTER — Encounter (HOSPITAL_COMMUNITY): Payer: Self-pay | Admitting: Student

## 2019-09-22 DIAGNOSIS — J9611 Chronic respiratory failure with hypoxia: Secondary | ICD-10-CM | POA: Diagnosis present

## 2019-09-22 DIAGNOSIS — Z20822 Contact with and (suspected) exposure to covid-19: Secondary | ICD-10-CM | POA: Diagnosis present

## 2019-09-22 DIAGNOSIS — F17220 Nicotine dependence, chewing tobacco, uncomplicated: Secondary | ICD-10-CM | POA: Diagnosis present

## 2019-09-22 DIAGNOSIS — J470 Bronchiectasis with acute lower respiratory infection: Principal | ICD-10-CM | POA: Diagnosis present

## 2019-09-22 DIAGNOSIS — E119 Type 2 diabetes mellitus without complications: Secondary | ICD-10-CM | POA: Diagnosis present

## 2019-09-22 DIAGNOSIS — J441 Chronic obstructive pulmonary disease with (acute) exacerbation: Secondary | ICD-10-CM | POA: Diagnosis present

## 2019-09-22 DIAGNOSIS — J9620 Acute and chronic respiratory failure, unspecified whether with hypoxia or hypercapnia: Secondary | ICD-10-CM | POA: Diagnosis present

## 2019-09-22 DIAGNOSIS — Z888 Allergy status to other drugs, medicaments and biological substances status: Secondary | ICD-10-CM | POA: Diagnosis not present

## 2019-09-22 DIAGNOSIS — L2089 Other atopic dermatitis: Secondary | ICD-10-CM | POA: Diagnosis not present

## 2019-09-22 DIAGNOSIS — J439 Emphysema, unspecified: Secondary | ICD-10-CM | POA: Diagnosis not present

## 2019-09-22 DIAGNOSIS — I10 Essential (primary) hypertension: Secondary | ICD-10-CM | POA: Diagnosis present

## 2019-09-22 DIAGNOSIS — R21 Rash and other nonspecific skin eruption: Secondary | ICD-10-CM | POA: Diagnosis present

## 2019-09-22 DIAGNOSIS — R0602 Shortness of breath: Secondary | ICD-10-CM | POA: Diagnosis not present

## 2019-09-22 DIAGNOSIS — J189 Pneumonia, unspecified organism: Secondary | ICD-10-CM | POA: Diagnosis present

## 2019-09-22 DIAGNOSIS — Z79899 Other long term (current) drug therapy: Secondary | ICD-10-CM

## 2019-09-22 DIAGNOSIS — Z7984 Long term (current) use of oral hypoglycemic drugs: Secondary | ICD-10-CM | POA: Diagnosis not present

## 2019-09-22 DIAGNOSIS — K219 Gastro-esophageal reflux disease without esophagitis: Secondary | ICD-10-CM | POA: Diagnosis present

## 2019-09-22 DIAGNOSIS — R778 Other specified abnormalities of plasma proteins: Secondary | ICD-10-CM

## 2019-09-22 DIAGNOSIS — Z8701 Personal history of pneumonia (recurrent): Secondary | ICD-10-CM | POA: Diagnosis not present

## 2019-09-22 DIAGNOSIS — F1020 Alcohol dependence, uncomplicated: Secondary | ICD-10-CM | POA: Diagnosis present

## 2019-09-22 DIAGNOSIS — R0789 Other chest pain: Secondary | ICD-10-CM | POA: Diagnosis not present

## 2019-09-22 DIAGNOSIS — Z825 Family history of asthma and other chronic lower respiratory diseases: Secondary | ICD-10-CM | POA: Diagnosis not present

## 2019-09-22 DIAGNOSIS — Z9981 Dependence on supplemental oxygen: Secondary | ICD-10-CM | POA: Diagnosis not present

## 2019-09-22 DIAGNOSIS — R079 Chest pain, unspecified: Secondary | ICD-10-CM | POA: Diagnosis not present

## 2019-09-22 DIAGNOSIS — Z7952 Long term (current) use of systemic steroids: Secondary | ICD-10-CM

## 2019-09-22 DIAGNOSIS — R069 Unspecified abnormalities of breathing: Secondary | ICD-10-CM | POA: Diagnosis not present

## 2019-09-22 DIAGNOSIS — F10239 Alcohol dependence with withdrawal, unspecified: Secondary | ICD-10-CM | POA: Diagnosis present

## 2019-09-22 DIAGNOSIS — J80 Acute respiratory distress syndrome: Secondary | ICD-10-CM | POA: Diagnosis not present

## 2019-09-22 DIAGNOSIS — J479 Bronchiectasis, uncomplicated: Secondary | ICD-10-CM

## 2019-09-22 DIAGNOSIS — Z8249 Family history of ischemic heart disease and other diseases of the circulatory system: Secondary | ICD-10-CM

## 2019-09-22 HISTORY — DX: Type 2 diabetes mellitus without complications: E11.9

## 2019-09-22 LAB — CBC WITH DIFFERENTIAL/PLATELET
Abs Immature Granulocytes: 0.07 10*3/uL (ref 0.00–0.07)
Basophils Absolute: 0 10*3/uL (ref 0.0–0.1)
Basophils Relative: 0 %
Eosinophils Absolute: 0.1 10*3/uL (ref 0.0–0.5)
Eosinophils Relative: 1 %
HCT: 43 % (ref 39.0–52.0)
Hemoglobin: 13.5 g/dL (ref 13.0–17.0)
Immature Granulocytes: 0 %
Lymphocytes Relative: 11 %
Lymphs Abs: 1.9 10*3/uL (ref 0.7–4.0)
MCH: 31.7 pg (ref 26.0–34.0)
MCHC: 31.4 g/dL (ref 30.0–36.0)
MCV: 100.9 fL — ABNORMAL HIGH (ref 80.0–100.0)
Monocytes Absolute: 0.9 10*3/uL (ref 0.1–1.0)
Monocytes Relative: 6 %
Neutro Abs: 13.7 10*3/uL — ABNORMAL HIGH (ref 1.7–7.7)
Neutrophils Relative %: 82 %
Platelets: 286 10*3/uL (ref 150–400)
RBC: 4.26 MIL/uL (ref 4.22–5.81)
RDW: 13.2 % (ref 11.5–15.5)
WBC: 16.7 10*3/uL — ABNORMAL HIGH (ref 4.0–10.5)
nRBC: 0 % (ref 0.0–0.2)

## 2019-09-22 LAB — TROPONIN I (HIGH SENSITIVITY)
Troponin I (High Sensitivity): 35 ng/L — ABNORMAL HIGH (ref <18)
Troponin I (High Sensitivity): 41 ng/L — ABNORMAL HIGH (ref <18)
Troponin I (High Sensitivity): 21 ng/L — ABNORMAL HIGH (ref <18)
Troponin I (High Sensitivity): 23 ng/L — ABNORMAL HIGH (ref <18)

## 2019-09-22 LAB — GLUCOSE, CAPILLARY
Glucose-Capillary: 143 mg/dL — ABNORMAL HIGH (ref 70–99)
Glucose-Capillary: 136 mg/dL — ABNORMAL HIGH (ref 70–99)

## 2019-09-22 LAB — HEMOGLOBIN A1C
Hgb A1c MFr Bld: 6.9 % — ABNORMAL HIGH (ref 4.8–5.6)
Mean Plasma Glucose: 151.33 mg/dL

## 2019-09-22 LAB — COMPREHENSIVE METABOLIC PANEL
ALT: 23 U/L (ref 0–44)
AST: 23 U/L (ref 15–41)
Albumin: 3.4 g/dL — ABNORMAL LOW (ref 3.5–5.0)
Alkaline Phosphatase: 40 U/L (ref 38–126)
Anion gap: 10 (ref 5–15)
BUN: 12 mg/dL (ref 6–20)
CO2: 27 mmol/L (ref 22–32)
Calcium: 9.3 mg/dL (ref 8.9–10.3)
Chloride: 107 mmol/L (ref 98–111)
Creatinine, Ser: 0.74 mg/dL (ref 0.61–1.24)
GFR calc Af Amer: >60 mL/min (ref >60)
GFR calc non Af Amer: >60 mL/min (ref >60)
Glucose, Bld: 108 mg/dL — ABNORMAL HIGH (ref 70–99)
Potassium: 4.2 mmol/L (ref 3.5–5.1)
Sodium: 144 mmol/L (ref 135–145)
Total Bilirubin: 0.5 mg/dL (ref 0.3–1.2)
Total Protein: 6 g/dL — ABNORMAL LOW (ref 6.5–8.1)

## 2019-09-22 LAB — PROCALCITONIN: Procalcitonin: <0.1 ng/mL

## 2019-09-22 LAB — SARS CORONAVIRUS 2 BY RT PCR (HOSPITAL ORDER, PERFORMED IN ~~LOC~~ HOSPITAL LAB): SARS Coronavirus 2: NEGATIVE

## 2019-09-22 LAB — CBG MONITORING, ED: Glucose-Capillary: 278 mg/dL — ABNORMAL HIGH (ref 70–99)

## 2019-09-22 MED ORDER — INSULIN ASPART 100 UNIT/ML ~~LOC~~ SOLN
0.0000 [IU] | Freq: Three times a day (TID) | SUBCUTANEOUS | Status: DC
  Administered 2019-09-22: 1 [IU] via SUBCUTANEOUS
  Administered 2019-09-22: 5 [IU] via SUBCUTANEOUS
  Administered 2019-09-23: 1 [IU] via SUBCUTANEOUS
  Administered 2019-09-23: 2 [IU] via SUBCUTANEOUS
  Administered 2019-09-23 – 2019-09-24 (×2): 1 [IU] via SUBCUTANEOUS
  Administered 2019-09-24 – 2019-09-25 (×3): 2 [IU] via SUBCUTANEOUS

## 2019-09-22 MED ORDER — ALBUTEROL SULFATE HFA 108 (90 BASE) MCG/ACT IN AERS
2.0000 | INHALATION_SPRAY | Freq: Four times a day (QID) | RESPIRATORY_TRACT | Status: DC | PRN
  Administered 2019-09-24 (×2): 2 via RESPIRATORY_TRACT
  Filled 2019-09-22: qty 6.7

## 2019-09-22 MED ORDER — SODIUM CHLORIDE 0.9 % IV SOLN
500.0000 mg | Freq: Once | INTRAVENOUS | Status: AC
  Administered 2019-09-22: 500 mg via INTRAVENOUS
  Filled 2019-09-22: qty 500

## 2019-09-22 MED ORDER — LORAZEPAM 2 MG/ML IJ SOLN: 0.0000 mg | Freq: Two times a day (BID) | INTRAMUSCULAR | Status: DC

## 2019-09-22 MED ORDER — LORAZEPAM 1 MG PO TABS: 0.0000 mg | ORAL_TABLET | Freq: Four times a day (QID) | ORAL | Status: DC

## 2019-09-22 MED ORDER — IPRATROPIUM-ALBUTEROL 0.5-2.5 (3) MG/3ML IN SOLN
3.0000 mL | Freq: Once | RESPIRATORY_TRACT | Status: AC
  Administered 2019-09-22: 3 mL via RESPIRATORY_TRACT
  Filled 2019-09-22: qty 3

## 2019-09-22 MED ORDER — UMECLIDINIUM BROMIDE 62.5 MCG/INH IN AEPB
1.0000 | INHALATION_SPRAY | Freq: Every day | RESPIRATORY_TRACT | Status: DC
  Administered 2019-09-23 – 2019-09-25 (×3): 1 via RESPIRATORY_TRACT
  Filled 2019-09-22: qty 7

## 2019-09-22 MED ORDER — SODIUM CHLORIDE 0.9 % IV SOLN
1.0000 g | INTRAVENOUS | Status: DC
  Administered 2019-09-22 – 2019-09-23 (×2): 1 g via INTRAVENOUS
  Filled 2019-09-22 (×2): qty 10

## 2019-09-22 MED ORDER — METOPROLOL SUCCINATE ER 25 MG PO TB24
25.0000 mg | ORAL_TABLET | Freq: Every day | ORAL | Status: DC
  Administered 2019-09-22 – 2019-09-25 (×4): 25 mg via ORAL
  Filled 2019-09-22 (×4): qty 1

## 2019-09-22 MED ORDER — THIAMINE HCL 100 MG PO TABS
100.0000 mg | ORAL_TABLET | Freq: Every day | ORAL | Status: DC
  Administered 2019-09-22: 100 mg via ORAL

## 2019-09-22 MED ORDER — ALBUTEROL (5 MG/ML) CONTINUOUS INHALATION SOLN
5.0000 mg/h | INHALATION_SOLUTION | Freq: Once | RESPIRATORY_TRACT | Status: AC
  Administered 2019-09-22: 5 mg/h via RESPIRATORY_TRACT
  Filled 2019-09-22: qty 20

## 2019-09-22 MED ORDER — SODIUM CHLORIDE 0.9 % IV BOLUS
1000.0000 mL | Freq: Once | INTRAVENOUS | Status: AC
  Administered 2019-09-22: 1000 mL via INTRAVENOUS

## 2019-09-22 MED ORDER — ALBUTEROL SULFATE HFA 108 (90 BASE) MCG/ACT IN AERS
2.0000 | INHALATION_SPRAY | Freq: Four times a day (QID) | RESPIRATORY_TRACT | Status: DC
  Administered 2019-09-22 – 2019-09-25 (×8): 2 via RESPIRATORY_TRACT
  Filled 2019-09-22: qty 6.7

## 2019-09-22 MED ORDER — THIAMINE HCL 100 MG/ML IJ SOLN
100.0000 mg | Freq: Every day | INTRAMUSCULAR | Status: DC
  Administered 2019-09-23: 100 mg via INTRAVENOUS
  Filled 2019-09-22 (×3): qty 2

## 2019-09-22 MED ORDER — LOSARTAN POTASSIUM 50 MG PO TABS
100.0000 mg | ORAL_TABLET | Freq: Every day | ORAL | Status: DC
  Administered 2019-09-22 – 2019-09-25 (×4): 100 mg via ORAL
  Filled 2019-09-22 (×4): qty 2

## 2019-09-22 MED ORDER — ENOXAPARIN SODIUM 40 MG/0.4ML ~~LOC~~ SOLN
40.0000 mg | Freq: Every day | SUBCUTANEOUS | Status: DC
  Administered 2019-09-23 – 2019-09-25 (×3): 40 mg via SUBCUTANEOUS
  Filled 2019-09-22 (×4): qty 0.4

## 2019-09-22 MED ORDER — TIOTROPIUM BROMIDE MONOHYDRATE 2.5 MCG/ACT IN AERS: 2.5000 ug | INHALATION_SPRAY | Freq: Two times a day (BID) | RESPIRATORY_TRACT | Status: DC

## 2019-09-22 MED ORDER — ACETAMINOPHEN 325 MG PO TABS: 650.0000 mg | ORAL_TABLET | Freq: Four times a day (QID) | ORAL | Status: DC | PRN

## 2019-09-22 MED ORDER — SODIUM CHLORIDE 0.9 % IV SOLN
2.0000 g | Freq: Once | INTRAVENOUS | Status: AC
  Administered 2019-09-22: 2 g via INTRAVENOUS
  Filled 2019-09-22: qty 2

## 2019-09-22 MED ORDER — TRIAMCINOLONE ACETONIDE 0.1 % EX CREA
1.0000 "application " | TOPICAL_CREAM | Freq: Two times a day (BID) | CUTANEOUS | Status: DC
  Administered 2019-09-22 – 2019-09-25 (×6): 1 via TOPICAL
  Filled 2019-09-22: qty 15

## 2019-09-22 MED ORDER — PREDNISONE 20 MG PO TABS
40.0000 mg | ORAL_TABLET | Freq: Every day | ORAL | Status: DC
  Administered 2019-09-22 – 2019-09-25 (×4): 40 mg via ORAL
  Filled 2019-09-22 (×5): qty 2

## 2019-09-22 MED ORDER — ONDANSETRON HCL 4 MG PO TABS: 4.0000 mg | ORAL_TABLET | Freq: Four times a day (QID) | ORAL | Status: DC | PRN

## 2019-09-22 MED ORDER — INSULIN ASPART 100 UNIT/ML ~~LOC~~ SOLN: 0.0000 [IU] | Freq: Every day | SUBCUTANEOUS | Status: DC

## 2019-09-22 MED ORDER — AEROCHAMBER PLUS FLO-VU LARGE MISC
1.0000 | Freq: Once | Status: DC
  Filled 2019-09-22: qty 1

## 2019-09-22 MED ORDER — MOMETASONE FURO-FORMOTEROL FUM 200-5 MCG/ACT IN AERO
2.0000 | INHALATION_SPRAY | Freq: Two times a day (BID) | RESPIRATORY_TRACT | Status: DC
  Administered 2019-09-22 – 2019-09-25 (×6): 2 via RESPIRATORY_TRACT
  Filled 2019-09-22: qty 8.8

## 2019-09-22 MED ORDER — LORAZEPAM 2 MG/ML IJ SOLN
0.0000 mg | Freq: Four times a day (QID) | INTRAMUSCULAR | Status: DC
  Administered 2019-09-22 – 2019-09-23 (×4): 2 mg via INTRAVENOUS
  Filled 2019-09-22 (×4): qty 1

## 2019-09-22 MED ORDER — AZITHROMYCIN 250 MG PO TABS
250.0000 mg | ORAL_TABLET | Freq: Every day | ORAL | Status: DC
  Administered 2019-09-23 – 2019-09-25 (×3): 250 mg via ORAL
  Filled 2019-09-22 (×3): qty 1

## 2019-09-22 MED ORDER — IPRATROPIUM BROMIDE HFA 17 MCG/ACT IN AERS
2.0000 | INHALATION_SPRAY | Freq: Once | RESPIRATORY_TRACT | Status: AC
  Administered 2019-09-22: 2 via RESPIRATORY_TRACT
  Filled 2019-09-22: qty 12.9

## 2019-09-22 MED ORDER — LORAZEPAM 1 MG PO TABS: 0.0000 mg | ORAL_TABLET | Freq: Two times a day (BID) | ORAL | Status: DC

## 2019-09-22 MED ORDER — PANTOPRAZOLE SODIUM 40 MG PO TBEC
40.0000 mg | DELAYED_RELEASE_TABLET | Freq: Every day | ORAL | Status: DC
  Administered 2019-09-22 – 2019-09-25 (×4): 40 mg via ORAL
  Filled 2019-09-22 (×5): qty 1

## 2019-09-22 MED ORDER — SENNOSIDES-DOCUSATE SODIUM 8.6-50 MG PO TABS: 1.0000 | ORAL_TABLET | Freq: Every evening | ORAL | Status: DC | PRN

## 2019-09-22 MED ORDER — ALBUTEROL SULFATE HFA 108 (90 BASE) MCG/ACT IN AERS
8.0000 | INHALATION_SPRAY | Freq: Once | RESPIRATORY_TRACT | Status: AC
  Administered 2019-09-22: 8 via RESPIRATORY_TRACT
  Filled 2019-09-22: qty 6.7

## 2019-09-22 MED ORDER — SODIUM CHLORIDE 0.9% FLUSH
3.0000 mL | Freq: Two times a day (BID) | INTRAVENOUS | Status: DC
  Administered 2019-09-22 – 2019-09-25 (×6): 3 mL via INTRAVENOUS

## 2019-09-22 MED ORDER — DEXTROSE 5 % IV SOLN: 250.0000 mg | INTRAVENOUS | Status: DC

## 2019-09-22 MED ORDER — ACETAMINOPHEN 650 MG RE SUPP: 650.0000 mg | Freq: Four times a day (QID) | RECTAL | Status: DC | PRN

## 2019-09-22 MED ORDER — FOLIC ACID 1 MG PO TABS
1.0000 mg | ORAL_TABLET | Freq: Every day | ORAL | Status: DC
  Administered 2019-09-22 – 2019-09-25 (×4): 1 mg via ORAL
  Filled 2019-09-22 (×5): qty 1

## 2019-09-22 MED ORDER — VANCOMYCIN HCL 1500 MG/300ML IV SOLN
1500.0000 mg | Freq: Once | INTRAVENOUS | Status: AC
  Administered 2019-09-22: 1500 mg via INTRAVENOUS
  Filled 2019-09-22: qty 300

## 2019-09-22 MED ORDER — ONDANSETRON HCL 4 MG/2ML IJ SOLN: 4.0000 mg | Freq: Four times a day (QID) | INTRAMUSCULAR | Status: DC | PRN

## 2019-09-22 NOTE — ED Notes (Signed)
Repeat troponin drawn. Patient updated on plan of care, verbalized understanding. Respirations even, minimally labored at this time. RR of 24. SpO2 96% on 3.5L. Patient denies needs at this time.

## 2019-09-22 NOTE — ED Notes (Signed)
Pt stuck in the R AC and L AC for blood cultures. Only blue top culture was obtained on the LAC, pt jerked and needled was removed. RN notified. EKG has been obtained.

## 2019-09-22 NOTE — Hospital Course (Addendum)
COPD exacerbation Recurrent pneumonia Bronchiectasis on bronchoscopy Presents with increased SOB, but cough baseline and not productive. He has had similar symptoms many times previously. PFTs in April 2021 with FEV1/FVC which was 54.01% of expected. CXR this admission with vague R lower lung field opacity, same location as opacity on prior CXRs. Has had recurrent pneumonia as far back as 2015. bronchoscopy 08/10/2018 for recurrent PNU demonstrating severe chronic bronchitis and bronchiectasis, neutrophil predominance and transbronchial biopsies with benign lung tissue with inflammation. CT on 07/05/19 with no ILD, no centrally obstructing lesion. Esophagram on 07/06/19 with no stenosis but severe reflux which could be contributing to COPD. Other pulm workup significant for IgE WNL, allergen profile for mold equivocal for Candida albicans but negative for all others, negative hypersensitivity pneumonitis panel. Has very good outpatient follow up with pulmonology. Is on Dulera, Spiriva, and albuterol and also prednisone 40mg  daily, states he has been compliant with all these, though at one point earlier this admission denied steroid use. Treated with ceftriaxone, azithromycin, and prednisone initially with rapid improvement in SOB. No fevers this admission, cough remains unproductive so did not culture. Discharged on his home Dulera, Spiriva, albuterol, Prednisone. Discharged with oral azithromycin to complete 5 day course.    Alcohol Use Disorder  Reports using 2x 24oz beers per day, which is decreased from prior. No hx of withdrawal seizures. CIWA scores max of 15 indicating mild withdrawal, requiring relatively low amounts of Ativan. Discharged with PO thiamine.    Skin rash Reports a rash for the past several months on his abdomen and both arms. His PCP has been treating this with triamcinolone, but this has not really helped. Referral provided to dermatology for biopsy/further assessment.

## 2019-09-22 NOTE — Progress Notes (Signed)
Pt woke up to urinate.  CIWA reassessed = 15.  MD notified.  Pt pleasant at present.  Will continue to monitor.

## 2019-09-22 NOTE — Progress Notes (Addendum)
Pt of Internal Med teaching service,

## 2019-09-22 NOTE — ED Notes (Signed)
Attempted to call nursing report.  

## 2019-09-22 NOTE — Progress Notes (Addendum)
Pt arrived to unit, on 4L of O2, appeared drowsy, able to aroused. Oriented to unit

## 2019-09-22 NOTE — H&P (Signed)
Date: 09/22/2019               Patient Name:  James [name] MRN: [id number]  DOB: 1960/02/09 Age / Sex: 59 y.o., male   PCP: [name], MD         Medical Service: Internal Medicine Teaching Service         Attending Physician: Dr. Evette Doffing, Mallie Mussel, *    First Contact: Dr. Bridgett Larsson Pager: [phone]  Second Contact: Dr. Myrtie Hawk Pager: [phone]       After Hours (After 5p/  First Contact Pager: [phone]  weekends / holidays): Second Contact Pager: [phone]   Chief Complaint: shortness of breath  History of Present Illness:   59 y/o M with hx of HTN, alcohol use, DM, COPD on 3.5L at home presenting with worsening SOB. Patient somnolent on interview given recent Ativan use for alcohol withdrawal, but able to answer questions appropriately. He states that SOB is his chief complaint, and that this feels similar to prior COPD exacerbations. Reports a chronic cough that is not above baseline, does not produce sputum. Also complains of a rash for the past several months present on his abdomen and arms and face, has improved but not resolved with triamcinolone from his PCP. Denies fever, chills, chest pain, palpitations, abdominal pain, n/v.  He has had recent admission in July, June, and April for pneumonia with component of COPD exacerbation, all in the RLL. He states that in between these hospitalizations, his symptoms do return to baseline. Prior antibiotic regimens include azithromycin, augmentin, and azithromycin + Rocephin.   Meds:  Current Outpatient Medications  Medication Instructions  . Accu-Chek Softclix Lancets lancets Use as instructed to check blood sugar once daily.  [name] albuterol (PROAIR HFA) 108 (90 Base) MCG/ACT inhaler 2 puffs, Inhalation, Every 6 hours PRN  . albuterol (PROVENTIL) 2.5 mg, Nebulization, Every 6 hours PRN  . azithromycin (ZITHROMAX) 250 MG tablet Take two once then one daily until gone  . Blood Glucose Monitoring Suppl (TRUE METRIX METER)  w/Device KIT Use to measure blood sugar twice a day  . folic acid (FOLVITE) 1 mg, Oral, Daily  . glucose blood (TRUE METRIX BLOOD GLUCOSE TEST) test strip Use as instructed  . losartan (COZAAR) 100 mg, Oral, Daily  . metFORMIN (GLUCOPHAGE) 500 mg, Oral, 2 times daily with meals  . metoprolol succinate (TOPROL-XL) 25 mg, Oral, Daily  . mometasone-formoterol (DULERA) 200-5 MCG/ACT AERO 2 puffs, Inhalation, 2 times daily  . pantoprazole (PROTONIX) 40 mg, Oral, Daily  . predniSONE (DELTASONE) 20 mg, Oral, Daily with breakfast, Take 4 tablets daily for 5 days then 3 tablets for 5 days then two tablets a day and stay  . Respiratory Therapy Supplies (FLUTTER) DEVI Use 4 times daily  . thiamine (VITAMIN B-1) 100 mg, Oral, Daily  . Tiotropium Bromide Monohydrate (SPIRIVA RESPIMAT) 2.5 MCG/ACT AERS Two puff daily  . triamcinolone cream (KENALOG) 0.1 % 1 application, Topical, 2 times daily     Allergies: Allergies as of 09/22/2019 - Review Complete 09/22/2019  Allergen Reaction Noted  . Gabapentin Other (See Comments) 10/05/2018   Past Medical History:  Diagnosis Date  . Acute on chronic respiratory failure with hypoxia ([place]) 06/08/2013  . CAP (community acquired pneumonia) 05/17/2018   See admit 05/17/18 ? rml  with   covid pcr neg - rx augmentin > f/u cxr in 4-6 weeks is fine unless condition declines   . Community acquired pneumonia of right lower lobe of  lung 05/17/2018   See admit 05/17/18 ? rml  with   covid pcr neg - rx augmentin > f/u cxr in 4-6 weeks is fine unless condition declines   . COPD (chronic obstructive pulmonary disease) ([place])    not on home  . Diabetes ([place])   . Dyspnea   . Hypertension   . Pneumonia 04/06/2016  . Pneumothorax    2016, fell from ladder  . 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 Tick bite 06/03/2018      Family History:  Family History  Problem Relation Age of Onset  . Heart disease Father   . COPD Sister     Social History:  Social History    Tobacco Use  . Smoking status: Former Smoker    Packs/day: 1.50    Years: 40.00    Pack years: 60.00    Types: Cigarettes    Quit date: 2014    Years since quitting: 7.7  . Smokeless tobacco: Current User    Types: Chew  Vaping Use  . Vaping Use: Never used  Substance Use Topics  . Alcohol use: Yes    Alcohol/week: 28.0 standard drinks    Types: 28 Cans of beer per week    Comment: 4 beers a night; + jitters with not drinking, denies DTs or seizures  . Drug use: Yes    Types: Marijuana    Comment: daily; quit using crack and methamphetamine about 5 months ago    Currently has 2x24oz beers per day.  Uses roughly 1 tin of dip per day.  Review of Systems: Review of Systems  Constitutional: Negative for chills and fever.  HENT: Negative for congestion and sore throat.   Eyes: Negative for blurred vision and double vision.  Respiratory: Positive for cough (baseline, dry) and shortness of breath.   Cardiovascular: Positive for chest pain. Negative for palpitations.  Gastrointestinal: Negative for abdominal pain, nausea and vomiting.  Genitourinary: Negative for dysuria and urgency.  Musculoskeletal: Negative for back pain and myalgias.  Skin: Positive for itching and rash.  Neurological: Negative for dizziness, loss of consciousness and headaches.     Physical Exam: Blood pressure (!) 131/116, pulse (!) 120, temperature (!) 97.3 F (36.3 C), temperature source Oral, resp. rate (!) 28, height 5' 9.5" (1.765 m), weight 83 kg, SpO2 96 %. Physical Exam Vitals and nursing note reviewed.  Constitutional:      General: He is not in acute distress.    Appearance: He is ill-appearing.     Comments: Drowsy but arousable  HENT:     Head: Normocephalic and atraumatic.     Mouth/Throat:     Mouth: Mucous membranes are moist.     Pharynx: Oropharynx is clear.  Eyes:     Extraocular Movements: Extraocular movements intact.  Cardiovascular:     Rate and Rhythm: Tachycardia  present. Rhythm irregular.     Comments: Occasional PVCs, at times irregular but clear P waves Pulmonary:     Effort: Pulmonary effort is normal. Tachypnea present. No accessory muscle usage or respiratory distress.     Breath sounds: Examination of the right-upper field reveals wheezing. Examination of the left-upper field reveals wheezing. Examination of the right-middle field reveals wheezing. Examination of the left-middle field reveals wheezing. Examination of the right-lower field reveals wheezing. Examination of the left-lower field reveals wheezing. Wheezing (mild) present.  Abdominal:     General: Bowel sounds are normal.     Palpations: Abdomen is soft.  Musculoskeletal:  Cervical back: Neck supple.     Right lower leg: No edema.     Left lower leg: No edema.  Skin:    General: Skin is warm and dry.     Capillary Refill: Capillary refill takes less than 2 seconds.     Findings: Rash (scattered maculopapular rash affecting bilateral arms, abdomen, face) present.     Comments: Face is noted to be quite erythematous  Neurological:     General: No focal deficit present.     Mental Status: He is oriented to person, place, and time.     Comments: Mild tremors, beta agonist vs alcohol withdrawal  Psychiatric:        Mood and Affect: Mood normal.        Behavior: Behavior normal.      EKG: personally reviewed my interpretation is sinus tachycardia, T waves present on prior EKG, no ST elevations  CXR: personally reviewed my interpretation is RLL hazy opacity, same locations as prior pneumonia from CXR on 6/30, 6/16, and 4/19 of this year  Assessment & Plan by Problem: Active Problems:   Alcohol dependence syndrome (HCC)   Hypertension   COPD exacerbation (HCC)   Pneumonia   Chronic respiratory failure with hypoxia (HCC)   Type 2 diabetes mellitus without complication, without long-term current use of insulin (HCC)   James [name] is a 59 y.o. year old male with hx of  HTN, alcohol use, DM, COPD on 3.5L at home presenting with SOB above baseline. Denies fever, chills, productive cough. CXR with RLL infiltrates, has been hospitalized 3x this year for pneumonia in the same location.  Dispo: Admit patient to Inpatient with expected length of stay greater than 2 midnights.  COPD exacerbation Recurrent pneumonia      PFTs in April 2021 with FEV1/FVC which was 54.01% of expected, improved to 61.87% after bronchodilator. His PCP has him on prednisone 33m daily, though he states he has not had steroids recently. Besides PFT, our last record of pulmonology visit was in August 2020, at that time his inhalers were addressed. He is prescribed Dulera, Spiriva, and albuterol at home and he endorses compliance with all of these.  Bronchogram on 07/06/19 with severe reflux which could be contributing to COPD. This presentation with increased SOB, but cough baseline and not productive.      Hx of frequent R lower and middle lobe pnuemonia as far back as 2015. Recently has responded well to azithromycin, rocephin, and augmentin. Had bronchoscopy 08/10/2018 for recurrent PNU demonstrating severe chronic bronchitis and bronchiectic changes, neutrophil predominance and transbronchial biopsies with benign lung tissue with inflammation. Chronic alcohol use may be contributing to aspiration as well. CT on 07/05/19 with no ILD, no centrally obstructing lesion. Other pulm workup significant for IgE WNL, allergen profile for mold equivocal for Candida albicans but negative for all others, negative hypersensitivity pneumonitis panel. -Rock NephewPE score of 1.5 (low risk) -start ceftriaxone and azithromycin -prednisone 429mdaily for 5 days  -Protonix 4033maily -resume home Dulera -incruse ellipta  -albuterol prn -consider pulm consult -daily CBC  Type II DM On metformin 500m33mD. A1c 6.9. -start SSI  GERD Esophogram 7/2 with severe reflux to the level of the cervical esophagus.  -continue  protonix 40 mg daily.  Essential HTN -resume home losartan 100mg20m Toprol 25mg 72mohol dependence  Reports using 2x 24oz beers per day, which is decreased from prior. No hx of withdrawal seizures. Had CIWA of 13 in ED which improved  to 0 with 64m Ativan. -CIWA protocol with Ativan  Skin rash On triamcinolone cream by PCP, patient reports improvement in itching while using this though the rash has not resolved.  -resume triamcinolone  Signed: CAndrew Au MD 09/22/2019, 4:25 PM  Pager: [phone] After 5pm on weekdays and 1pm on weekends: On Call pager: [phone]

## 2019-09-22 NOTE — ED Triage Notes (Signed)
Patient c/o COPD flare up, started upon awakening this morning around 0130. Reports chest pain and shortness of breath, similar to previous flare ups. Nonrebreather removed, pt placed on home dose of 3.5L via [place], SpO2 94% on same. Denies known COVID exposure. Denies fever. Not vaccinated.

## 2019-09-22 NOTE — ED Provider Notes (Signed)
[facility] EMERGENCY DEPARTMENT Provider Note   CSN: [id number] Arrival date & time: 09/22/19  0342     History Chief Complaint  Patient presents with  . Shortness of Breath    James [name] is a 59 y.o. male with a hx of COPD, chronic hypoxic respiratory failure on 3.5L of O2 @ night, hypertension, T2DM, & alcohol abuse who presents to the ED via EMS for acute onset dyspnea that began when he woke from sleep @ 0100 this AM. He states he woke up coughing, some mild yellow sputum- mostly dry with associated dyspnea & chest tightness. He states his SpO2 was in the 80s on his oxygen, he called 911. Per EMS patient was in the 60s on RA @ rest, applied NRB with improvement. EMS administered 324 mg of Aspirin, 2 NTG, 125 mg of Solumedrol, and 106m of Magnesium en route. Patient states he was very upset/frustrated before he went to sleep which he thinks may have caused this flare, he otherwise felt okay tonight prior to bed. He denies fever, chills, syncope, N/V/D,  leg pain/swelling, hemoptysis, recent surgery/trauma, recent long travel, hormone use, personal hx of cancer, or hx of DVT/PE. This feels like prior COPD which has required admission.   HPI     Past Medical History:  Diagnosis Date  . Acute on chronic respiratory failure with hypoxia ([place] 06/08/2013  . CAP (community acquired pneumonia) 05/17/2018   See admit 05/17/18 ? rml  with   covid pcr neg - rx augmentin > f/u cxr in 4-6 weeks is fine unless condition declines   . Community acquired pneumonia of right lower lobe of lung 05/17/2018   See admit 05/17/18 ? rml  with   covid pcr neg - rx augmentin > f/u cxr in 4-6 weeks is fine unless condition declines   . COPD (chronic obstructive pulmonary disease) ([place]    not on home  . Dyspnea   . Hypertension   . Pneumonia 04/06/2016  . Pneumothorax    2016, fell from ladder  . 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 Tick bite 06/03/2018    Patient Active Problem List     Diagnosis Date Noted  . Type 2 diabetes mellitus without complication, without long-term current use of insulin ([facility] 07/24/2019  . 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 Other atopic dermatitis 05/01/2019  . Chronic respiratory failure with hypoxia ([facility] 04/23/2019  . Intellectual disability 04/23/2019  . 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 Marijuana abuse 08/09/2018  . COPD exacerbation ([place] 02/16/2018  . Hypertension 05/17/2016  . Low back pain radiating to both legs 04/18/2016  . History of tobacco abuse 04/06/2016  . Alcohol dependence syndrome ([place] 04/06/2016  . COPD mixed type ([facility] 03/26/2016    Past Surgical History:  Procedure Laterality Date  . VIDEO ASSISTED THORACOSCOPY (VATS)/THOROCOTOMY Left 06/11/2013   Procedure: VIDEO ASSISTED THORACOSCOPY (VATS)/THOROCOTOMY;  Surgeon: PIvin Poot MD;  [facility]  Service: Thoracic;  Laterality: Left;  .Marland KitchenVIDEO BRONCHOSCOPY N/A 06/11/2013   Procedure: VIDEO BRONCHOSCOPY;  Surgeon: PIvin Poot MD;  [facility]  Service: Thoracic;  Laterality: N/A;  . VIDEO BRONCHOSCOPY N/A 08/10/2018   Procedure: VIDEO BRONCHOSCOPY WITH FLUORO;  Surgeon: SCandee Furbish MD;  [facility]  Service: Endoscopy;  Laterality: N/A;       Family History  Problem Relation Age of Onset  . Heart disease Father   . COPD Sister     Social History   Tobacco Use  .  Smoking status: Former Smoker    Packs/day: 1.50    Years: 40.00    Pack years: 60.00    Types: Cigarettes    Quit date: 2014    Years since quitting: 7.7  . Smokeless tobacco: Current User    Types: Chew  Vaping Use  . Vaping Use: Never used  Substance Use Topics  . Alcohol use: Yes    Alcohol/week: 28.0 standard drinks    Types: 28 Cans of beer per week    Comment: 4 beers a night; + jitters with not drinking, denies DTs or seizures  . Drug use: Yes    Types: Marijuana    Comment: daily; quit using crack and methamphetamine about 5 months ago     Home  Medications Prior to Admission medications   Medication Sig Start Date End Date Taking? Authorizing Provider  Accu-Chek Softclix Lancets lancets Use as instructed to check blood sugar once daily. 08/15/19   [name], MD  albuterol (PROAIR HFA) 108 (90 Base) MCG/ACT inhaler Inhale 2 puffs into the lungs every 6 (six) hours as needed for wheezing or shortness of breath. 08/23/19   [name], MD  albuterol (PROVENTIL) (2.5 MG/3ML) 0.083% nebulizer solution Take 3 mLs (2.5 mg total) by nebulization every 6 (six) hours as needed for wheezing or shortness of breath. 07/07/19   Asencion Noble, MD  azithromycin (ZITHROMAX) 250 MG tablet Take two once then one daily until gone Patient not taking: Reported on 08/29/2019 08/23/19   [name], MD  Blood Glucose Monitoring Suppl (TRUE METRIX METER) w/Device KIT Use to measure blood sugar twice a day Patient not taking: Reported on 08/29/2019 08/14/19   [name], MD  folic acid (FOLVITE) 1 MG tablet Take 1 tablet (1 mg total) by mouth daily. 08/23/19   [name], MD  glucose blood (TRUE METRIX BLOOD GLUCOSE TEST) test strip Use as instructed 08/14/19   [name], MD  losartan (COZAAR) 100 MG tablet Take 1 tablet (100 mg total) by mouth daily. 08/23/19   [name], MD  metFORMIN (GLUCOPHAGE) 500 MG tablet Take 1 tablet (500 mg total) by mouth 2 (two) times daily with a meal. 08/23/19 10/22/19  [name], MD  metoprolol succinate (TOPROL-XL) 25 MG 24 hr tablet Take 1 tablet (25 mg total) by mouth daily. 07/23/19   [name], MD  mometasone-formoterol (DULERA) 200-5 MCG/ACT AERO Inhale 2 puffs into the lungs 2 (two) times daily. 08/23/19   [name], MD  pantoprazole (PROTONIX) 40 MG tablet Take 1 tablet (40 mg total) by mouth daily. 08/23/19   [name], MD  predniSONE (DELTASONE) 20 MG tablet Take 1 tablet (20 mg total) by mouth daily with breakfast. Take 4 tablets daily for 5 days then  3 tablets for 5 days then two tablets a day and stay 08/23/19   [name], MD  Respiratory Therapy Supplies (FLUTTER) DEVI Use 4 times daily 04/27/18   [name], MD  thiamine (VITAMIN B-1) 100 MG tablet Take 1 tablet (100 mg total) by mouth daily. Patient not taking: Reported on 08/29/2019 08/23/19   [name], MD  Tiotropium Bromide Monohydrate (SPIRIVA RESPIMAT) 2.5 MCG/ACT AERS Two puff daily 08/23/19   [name], MD  triamcinolone cream (KENALOG) 0.1 % Apply 1 application topically 2 (two) times daily. 08/23/19   [name], MD    Allergies    Gabapentin  Review of  Systems   Review of Systems  Constitutional: Negative for chills and fever.  Respiratory: Positive for cough and shortness of breath.   Cardiovascular: Positive for chest pain. Negative for leg swelling.  Gastrointestinal: Negative for abdominal pain, nausea and vomiting.  Genitourinary: Negative for dysuria.  Neurological: Negative for syncope.  All other systems reviewed and are negative.   Physical Exam Updated Vital Signs BP (!) 154/106 (BP Location: Left Arm)   Pulse (!) 135   Temp 98.2 F (36.8 C) (Oral)   Resp (!) 30   Ht 5' 9.5" (1.765 m)   Wt 83 kg   SpO2 94%   BMI 26.64 kg/m   Physical Exam Vitals and nursing note reviewed.  Constitutional:      Appearance: He is well-developed. He is not toxic-appearing.  HENT:     Head: Normocephalic and atraumatic.  Eyes:     General:        Right eye: No discharge.        Left eye: No discharge.     Conjunctiva/sclera: Conjunctivae normal.  Cardiovascular:     Rate and Rhythm: Regular rhythm. Tachycardia present.  Pulmonary:     Effort: Tachypnea present. No respiratory distress.     Breath sounds: Decreased breath sounds (throughout) and wheezing (worse @ the bases) present. No rhonchi or rales.  Abdominal:     General: There is no distension.     Palpations: Abdomen is soft.     Tenderness: There is no abdominal  tenderness.  Musculoskeletal:     Cervical back: Neck supple.     Right lower leg: No tenderness. No edema.     Left lower leg: No tenderness. No edema.  Skin:    General: Skin is warm and dry.     Findings: No rash.  Neurological:     Mental Status: He is alert.     Comments: Clear speech.   Psychiatric:        Behavior: Behavior normal.     ED Results / Procedures / Treatments   Labs (all labs ordered are listed, but only abnormal results are displayed) Labs Reviewed  CBC WITH DIFFERENTIAL/PLATELET - Abnormal; Notable for the following components:      Result Value   WBC 16.7 (*)    MCV 100.9 (*)    Neutro Abs 13.7 (*)    All other components within normal limits  COMPREHENSIVE METABOLIC PANEL - Abnormal; Notable for the following components:   Glucose, Bld 108 (*)    Total Protein 6.0 (*)    Albumin 3.4 (*)    All other components within normal limits  TROPONIN I (HIGH SENSITIVITY) - Abnormal; Notable for the following components:   Troponin I (High Sensitivity) 35 (*)    All other components within normal limits  SARS CORONAVIRUS 2 BY RT PCR (HOSPITAL ORDER, [place] [facility])  TROPONIN I (HIGH SENSITIVITY)    EKG EKG Interpretation  Date/Time:  Saturday September 22 2019 03:53:22 EDT Ventricular Rate:  133 PR Interval:    QRS Duration: 94 QT Interval:  292 QTC Calculation: 435 R Axis:   81 Text Interpretation: Sinus tachycardia Probable anteroseptal infarct, old No significant change since last tracing Confirmed by Merrily Pew [phone]) on 09/22/2019 4:30:50 AM   Radiology DG Chest Portable 1 View  Result Date: 09/22/2019 CLINICAL DATA:  Dyspnea. COPD exacerbation. Chest pain and shortness of breath. EXAM: PORTABLE CHEST 1 VIEW COMPARISON:  Cyst 07/05/2019 FINDINGS: Stable cardiomediastinal contours. Chronic blunting  of the left costophrenic angle due to pleuroparenchymal scarring. Changes of emphysema noted with asymmetric increase hazy  opacity within the right lung base concerning for pneumonia. Numerous remote bilateral rib fracture deformities. IMPRESSION: 1. Increased hazy opacity within the right lung base concerning for pneumonia. 2. Emphysema. Electronically Signed   By: Kerby Moors M.D.   On: 09/22/2019 04:20    Procedures Procedures (including critical care time)  Medications Ordered in ED Medications  albuterol (VENTOLIN HFA) 108 (90 Base) MCG/ACT inhaler 8 puff (has no administration in time range)  aerochamber plus with mask device 1 each (has no administration in time range)  ipratropium (ATROVENT HFA) inhaler 2 puff (2 puffs Inhalation Given 09/22/19 0410)    ED Course  I have reviewed the triage vital signs and the nursing notes.  Pertinent labs & imaging results that were available during my care of the patient were reviewed by me and considered in my medical decision making (see chart for details).    MDM Rules/Calculators/A&P                         Patient presents to the ED with complaints of dyspnea.  Initially on NRB, transitioned to 3.5L via [place] with maintaining of SpO2 >92%, tachypnea noted. Poor air movement with wheezing. No peripheral edema.   DDX: COPD exacerbation, pneumonia, ACS, PE, CHF, pneumothorax.   Will give albuterol & atrovent inhaler puffs, no nebs per hospital protocol pending COVID 19 test.   Additional history obtained:  Additional history obtained from EMS. Previous records obtained and reviewed - seen by me in June of this year required bipap & admission for pneumonia & COPD.  EKG: No STEMI. No significant change since prior.  Lab Tests:  I Ordered, reviewed, and interpreted labs, which included:  CBC: Leukocytosis @ 16.7, fairly similar to prior but worsened some.  CMP: fairly unremarkable Troponin: Mild elevation- high suspicion for demand  Imaging Studies ordered:  I ordered imaging studies which included CXR, I independently visualized and interpreted imaging  which showed  1. Increased hazy opacity within the right lung base concerning for pneumonia. 2. Emphysema---> patient with multiple prior R lower lobe pneumonias, will place on vanc & cefepime for possible resistant pneumonia/HCAP.   COVID testing negative--> duoneb ordered.  Concern for COPD exacerbation & resistant pneumonia. Has received steroids & mag with EMS. Neb & abx started in the ED. Also placed on CIWA due to hx of alcohol use.   06:50: Patient care signed out to Britni Henderly PA-C at change of shift pending re-assessment & likely admission.   Findings and plan of care discussed with supervising physician Dr. Dayna Barker who is in agreement.   Portions of this note were generated with [employer]. Dictation errors may occur despite best attempts at proofreading.  Final Clinical Impression(s) / ED Diagnoses Final diagnoses:  None    Rx / DC Orders ED Discharge Orders    None       Amaryllis Dyke, PA-C 09/22/19 3810    Orpah Greek, MD 09/22/19 [phone]

## 2019-09-22 NOTE — ED Provider Notes (Signed)
Care transferred from previous provider at shift change. See note for full HPI.  In summation, 59 year old presents for evaluation of SOB and chest tightness. Hx of COPD on chronic O2 ay 3.5 L. EMS gave Solumedrol, Mag, Albuterol, ASA, nitro.  Her with continued wheeze. Elevated trop however no EKG changes 35>>41. Likely demand related. Chest xray with PNA increased from previous. Abx and steriods 1 months ago by Pulmonary. Hx EtOh however does not appear in WD. With last admission CIWA <5 did not require Ativan however previous provider placed orders.  Plan to likely admit for HCAP, COPD exacerbation  COVID negative. Physical Exam  BP (!) 141/81   Pulse (!) 119   Temp 98.2 F (36.8 C) (Oral)   Resp (!) 21   Ht 5' 9.5" (1.765 m)   Wt 83 kg   SpO2 94%   BMI 26.64 kg/m   Physical Exam Vitals and nursing note reviewed.  Constitutional:      General: He is not in acute distress.    Appearance: He is well-developed. He is not toxic-appearing or diaphoretic.  HENT:     Head: Atraumatic.  Eyes:     Pupils: Pupils are equal, round, and reactive to light.  Cardiovascular:     Rate and Rhythm: Regular rhythm. Tachycardia present.     Pulses: Normal pulses.     Heart sounds: Normal heart sounds.  Pulmonary:     Effort: Pulmonary effort is normal. No respiratory distress.     Breath sounds: Wheezing present.     Comments: Decreased lung sounds bilaterally however wheeze, left greater than right, Speaks without difficulty Abdominal:     General: Bowel sounds are normal. There is no distension.     Palpations: Abdomen is soft.  Musculoskeletal:        General: Normal range of motion.     Cervical back: Normal range of motion and neck supple.     Right lower leg: No tenderness. No edema.     Left lower leg: No tenderness. No edema.     Comments: Moves all 4 extremities without difficulty.  Compartments soft.  Bevelyn Buckles' sign negative  Skin:    General: Skin is warm and dry.      Capillary Refill: Capillary refill takes less than 2 seconds.     Comments: No edema, erythema or warmth  Neurological:     General: No focal deficit present.     Mental Status: He is alert.     ED Course/Procedures     .Critical Care Performed by: Nettie Elm, PA-C Authorized by: Nettie Elm, PA-C   Critical care provider statement:    Critical care time (minutes):  45   Critical care was necessary to treat or prevent imminent or life-threatening deterioration of the following conditions:  Respiratory failure   Critical care was time spent personally by me on the following activities:  Discussions with consultants, evaluation of patient's response to treatment, examination of patient, ordering and performing treatments and interventions, ordering and review of laboratory studies, ordering and review of radiographic studies, pulse oximetry, re-evaluation of patient's condition, obtaining history from patient or surrogate and review of old charts   Labs Reviewed  CBC WITH DIFFERENTIAL/PLATELET - Abnormal; Notable for the following components:      Result Value   WBC 16.7 (*)    MCV 100.9 (*)    Neutro Abs 13.7 (*)    All other components within normal limits  COMPREHENSIVE METABOLIC PANEL -  Abnormal; Notable for the following components:   Glucose, Bld 108 (*)    Total Protein 6.0 (*)    Albumin 3.4 (*)    All other components within normal limits  TROPONIN I (HIGH SENSITIVITY) - Abnormal; Notable for the following components:   Troponin I (High Sensitivity) 35 (*)    All other components within normal limits  TROPONIN I (HIGH SENSITIVITY) - Abnormal; Notable for the following components:   Troponin I (High Sensitivity) 41 (*)    All other components within normal limits  SARS CORONAVIRUS 2 BY RT PCR (HOSPITAL ORDER, [place] [facility])  CULTURE, BLOOD (ROUTINE X 2)  CULTURE, BLOOD (ROUTINE X 2)  EXPECTORATED SPUTUM ASSESSMENT W REFEX TO RESP  CULTURE  HEMOGLOBIN A1C  CBC  CREATININE, SERUM  PROCALCITONIN   DG Chest Portable 1 View  Result Date: 09/22/2019 CLINICAL DATA:  Dyspnea. COPD exacerbation. Chest pain and shortness of breath. EXAM: PORTABLE CHEST 1 VIEW COMPARISON:  Cyst 07/05/2019 FINDINGS: Stable cardiomediastinal contours. Chronic blunting of the left costophrenic angle due to pleuroparenchymal scarring. Changes of emphysema noted with asymmetric increase hazy opacity within the right lung base concerning for pneumonia. Numerous remote bilateral rib fracture deformities. IMPRESSION: 1. Increased hazy opacity within the right lung base concerning for pneumonia. 2. Emphysema. Electronically Signed   By: Kerby Moors M.D.   On: 09/22/2019 04:2   MDM  59 year old presents for SOB, chest tightness consistent with COPD exacerbation. Recently on Abx 1 month ago for RRL PNA. Given Mag, Albuterol, Solumedrol. COVID negative. Hx Etoh use however no DT, WD seizures, on CIWA however with prior admit CIWA <<<5 did not require ativan.  Continued wheeze here despite, Albuterol, IVF. Getting Abx for HCAP, plan for duoneb and likely admit  Patient reassessed. Persistently tachy 115 in room however with increased to 130's with ambulating to side of bed. Decreased breath sounds with wheeze L>. Will order continuous neb  Initial CIWA at 13, given Ativan, repeat CIWA 0  Patient with tachycardia has likely due to albuterol continuous nebulizer.  Compartments are soft.  Clinically no evidence of DVT on exam.  I have low suspicion for PE, ACS, dissection as cause of his shortness of breath given active wheeze on exam.  Low suspicion for sepsis at this time.  Patient with COPD exacerbation and questionable HCAP.  Has required multiple nebulizers as well as continuous nebulizer here in the emergency department.  Has been given antibiotics and fluids, by previous provider.  Will be admitted for continued management and evaluation.  CONSULT with  IM teaching MD who agrees to evaluate patient for admission.  The patient appears reasonably stabilized for admission considering the current resources, flow, and capabilities available in the ED at this time, and I doubt any other [facility] requiring further screening and/or treatment in the ED prior to admission.   KEIDEN DESKIN was evaluated in Emergency Department on 09/22/2019 for the symptoms described in the history of present illness. He was evaluated in the context of the global COVID-19 pandemic, which necessitated consideration that the patient might be at risk for infection with the SARS-CoV-2 virus that causes COVID-19. Institutional protocols and algorithms that pertain to the evaluation of patients at risk for COVID-19 are in a state of rapid change based on information released by regulatory bodies including the CDC and federal and state organizations. These policies and algorithms were followed during the patient's care in the ED.   Kalden Wanke A, PA-C  09/22/19 1226    Maudie Flakes, MD 09/22/19 [phone]

## 2019-09-23 LAB — GLUCOSE, CAPILLARY
Glucose-Capillary: 122 mg/dL — ABNORMAL HIGH (ref 70–99)
Glucose-Capillary: 127 mg/dL — ABNORMAL HIGH (ref 70–99)
Glucose-Capillary: 186 mg/dL — ABNORMAL HIGH (ref 70–99)
Glucose-Capillary: 150 mg/dL — ABNORMAL HIGH (ref 70–99)

## 2019-09-23 LAB — COMPREHENSIVE METABOLIC PANEL
ALT: 20 U/L (ref 0–44)
AST: 16 U/L (ref 15–41)
Albumin: 3.1 g/dL — ABNORMAL LOW (ref 3.5–5.0)
Alkaline Phosphatase: 39 U/L (ref 38–126)
Anion gap: 8 (ref 5–15)
BUN: 13 mg/dL (ref 6–20)
CO2: 29 mmol/L (ref 22–32)
Calcium: 9.2 mg/dL (ref 8.9–10.3)
Chloride: 104 mmol/L (ref 98–111)
Creatinine, Ser: 0.71 mg/dL (ref 0.61–1.24)
GFR calc Af Amer: >60 mL/min (ref >60)
GFR calc non Af Amer: >60 mL/min (ref >60)
Glucose, Bld: 155 mg/dL — ABNORMAL HIGH (ref 70–99)
Potassium: 4.4 mmol/L (ref 3.5–5.1)
Sodium: 141 mmol/L (ref 135–145)
Total Bilirubin: 0.7 mg/dL (ref 0.3–1.2)
Total Protein: 5.7 g/dL — ABNORMAL LOW (ref 6.5–8.1)

## 2019-09-23 LAB — CBC
HCT: 44.4 % (ref 39.0–52.0)
Hemoglobin: 13.8 g/dL (ref 13.0–17.0)
MCH: 32 pg (ref 26.0–34.0)
MCHC: 31.1 g/dL (ref 30.0–36.0)
MCV: 103 fL — ABNORMAL HIGH (ref 80.0–100.0)
Platelets: 282 10*3/uL (ref 150–400)
RBC: 4.31 MIL/uL (ref 4.22–5.81)
RDW: 13.3 % (ref 11.5–15.5)
WBC: 14.9 10*3/uL — ABNORMAL HIGH (ref 4.0–10.5)
nRBC: 0 % (ref 0.0–0.2)

## 2019-09-23 MED ORDER — LORAZEPAM 2 MG/ML IJ SOLN: 0.0000 mg | INTRAMUSCULAR | Status: DC | PRN

## 2019-09-23 MED ORDER — LORAZEPAM 1 MG PO TABS: 0.0000 mg | ORAL_TABLET | ORAL | Status: DC | PRN

## 2019-09-23 MED ORDER — THIAMINE HCL 100 MG PO TABS
100.0000 mg | ORAL_TABLET | Freq: Every day | ORAL | Status: DC
  Administered 2019-09-24 – 2019-09-25 (×2): 100 mg via ORAL
  Filled 2019-09-23 (×2): qty 1

## 2019-09-23 MED ORDER — LORAZEPAM 1 MG PO TABS
0.0000 mg | ORAL_TABLET | ORAL | Status: DC | PRN
  Administered 2019-09-23 – 2019-09-24 (×3): 1 mg via ORAL
  Filled 2019-09-23 (×3): qty 1

## 2019-09-23 NOTE — Progress Notes (Addendum)
Subjective:   Patient feeling better this morning, SOB improved. Report no change in cough, continues to be dry. CIWA scores elevated to 15 overnight, receiving Ativan.  Objective:  Vital signs in last 24 hours: Vitals:   09/22/19 2100 09/23/19 0007 09/23/19 0115 09/23/19 0433  BP: (!) 157/86 (!) 145/76  (!) 141/86  Pulse: (!) 103 98 92 85  Resp: (!) 25 19  19   Temp: 98.4 F (36.9 C) 98.4 F (36.9 C)  99.3 F (37.4 C)  TempSrc:  Oral  Axillary  SpO2: 97% 96%  97%  Weight:    85 kg  Height:        Physical Exam Constitutional: no acute distress Head: atraumatic, face is [name] ENT: external ears normal Eyes: EOMI Cardiovascular: regular rate and rhythm, normal heart sounds Pulmonary: effort normal, lungs clear to ascultation bilaterally Abdominal: flat, nontender, no rebound tenderness, bowel sounds normal Skin: warm and dry, scattered maculopapular rash to abdomen and arms Neurological: alert, no focal deficit Psychiatric: normal mood and affect  Assessment/Plan: James [name] is a 59 y.o. male with hx of HTN, alcohol use, DM, COPD on 3.5L at home presenting with SOB above baseline. Denies fever, chills, productive cough. CXR with RLL infiltrates, has been hospitalized many times since 2015 for pneumonia in the same location.  Active Problems:   Alcohol use disorder, moderate, dependence (HCC)   Hypertension   COPD exacerbation (HCC)   Pneumonia   Chronic respiratory failure with hypoxia (HCC)   Type 2 diabetes mellitus without complication, without long-term current use of insulin ([name])  COPD exacerbation Recurrent pneumonia Bronchiectasis on bronchoscopy Presents with increased SOB, but cough baseline and not productive. PFTs in April 2021 with FEV1/FVC which was 54.01% of expected, improved to 61.87% after bronchodilator. His PCP and also pulmonologist has him on Dulera, Spiriva, and albuterol and also prednisone 40mg  daily, though he states he has not had  steroids recently. Hx of frequent R lower and middle lobe pnuemonia as far back as 2015. Esophagram on 07/06/19 with no stenosis but severe reflux which could be contributing to COPD. bronchoscopy 08/10/2018 for recurrent PNU demonstrating severe chronic bronchitis and bronchiectasis, neutrophil predominance and transbronchial biopsies with benign lung tissue with inflammation. CT on 07/05/19 with no ILD, no centrally obstructing lesion. Other pulm workup significant for IgE WNL, allergen profile for mold equivocal for Candida albicans but negative for all others, negative hypersensitivity pneumonitis panel. Chronic alcohol use may be contributing to aspiration as well. -continue ceftriaxone and azithromycin, day 2 -prednisone 40mg m day 2/5 -Protonix 40mg  daily -continue home Dulera -continue  incruse ellipta  -contnue  albuterol prn -daily CBC   Alcohol Use Disorder  Reports using 2x 24oz beers per day, which is decreased from prior. No hx of withdrawal seizures. Had CIWA of 13 in ED which improved to 0 with 1mg  Ativan. -contnue CIWA protocol with Ativan -PO thiamine 100mg   Type II DM On metformin 500mg  BID. A1c 6.9. -start SSI   GERD Esophogram 7/2 with severe reflux to the level of the cervical esophagus.  -continue protonix 40 mg daily.   Essential HTN -contnue home losartan 100mg  and Toprol 25mg    Skin rash On triamcinolone cream by PCP, patient reports improvement in itching while using this though the rash has not resolved.  -resume triamcinolone  Diet: heart healthy carb modified IVF: none VTE: lovenox Prior to Admission Living Arrangement: home Anticipated Discharge Location: home Barriers to Discharge: pneumonia Dispo: Anticipated discharge in approximately 2-3 day(s).  Andrew Au, MD 09/23/2019, 6:38 AM Pager: [phone] After 5pm on weekdays and 1pm on weekends: On Call pager [phone]

## 2019-09-24 DIAGNOSIS — J479 Bronchiectasis, uncomplicated: Secondary | ICD-10-CM

## 2019-09-24 LAB — CBC
HCT: 42.1 % (ref 39.0–52.0)
Hemoglobin: 12.9 g/dL — ABNORMAL LOW (ref 13.0–17.0)
MCH: 31.3 pg (ref 26.0–34.0)
MCHC: 30.6 g/dL (ref 30.0–36.0)
MCV: 102.2 fL — ABNORMAL HIGH (ref 80.0–100.0)
Platelets: 244 10*3/uL (ref 150–400)
RBC: 4.12 MIL/uL — ABNORMAL LOW (ref 4.22–5.81)
RDW: 13.2 % (ref 11.5–15.5)
WBC: 11.2 10*3/uL — ABNORMAL HIGH (ref 4.0–10.5)
nRBC: 0 % (ref 0.0–0.2)

## 2019-09-24 LAB — BASIC METABOLIC PANEL
Anion gap: 8 (ref 5–15)
BUN: 16 mg/dL (ref 6–20)
CO2: 32 mmol/L (ref 22–32)
Calcium: 9.2 mg/dL (ref 8.9–10.3)
Chloride: 101 mmol/L (ref 98–111)
Creatinine, Ser: 0.64 mg/dL (ref 0.61–1.24)
GFR calc Af Amer: >60 mL/min (ref >60)
GFR calc non Af Amer: >60 mL/min (ref >60)
Glucose, Bld: 112 mg/dL — ABNORMAL HIGH (ref 70–99)
Potassium: 4.5 mmol/L (ref 3.5–5.1)
Sodium: 141 mmol/L (ref 135–145)

## 2019-09-24 LAB — GLUCOSE, CAPILLARY
Glucose-Capillary: 135 mg/dL — ABNORMAL HIGH (ref 70–99)
Glucose-Capillary: 168 mg/dL — ABNORMAL HIGH (ref 70–99)
Glucose-Capillary: 172 mg/dL — ABNORMAL HIGH (ref 70–99)
Glucose-Capillary: 135 mg/dL — ABNORMAL HIGH (ref 70–99)

## 2019-09-24 MED ORDER — GUAIFENESIN 100 MG/5ML PO SOLN
5.0000 mL | ORAL | Status: DC | PRN
  Administered 2019-09-24: 100 mg via ORAL
  Filled 2019-09-24: qty 5

## 2019-09-24 NOTE — Progress Notes (Addendum)
Subjective:    CIWA scores improved, max of 7 over past day, required only 2mg  Ativan.   James [name] states that his shortness of breath has slightly improved and is not too far from his baseline. James [name] requests oxygen while in the room and states that James [name] uses supplemental oxygen 24/7 at home. James [name] denies any fevers, chills, N/V, abdominal pain or any other symptoms. James [name] noes an itchy rash on his left arm that has persisted despite cream that [name] instructed him to use.   Objective:  Vital signs in last 24 hours: Vitals:   09/23/19 2101 09/23/19 2300 09/24/19 0446 09/24/19 0500  BP:  119/66 (!) 137/93 (!) 137/93  Pulse:  80  85  Resp:  (!) 24 20   Temp:  98.2 F (36.8 C) 98.5 F (36.9 C)   TempSrc:  Oral Oral   SpO2: 97% 96% 90%   Weight:   82.1 kg   Height:        Physical Exam Constitutional: no acute distress Head: atraumatic, face is [name] ENT: external ears normal Eyes: EOMI Cardiovascular: regular rate and rhythm, normal heart sounds Pulmonary: effort normal, lungs clear to ascultation bilaterally Abdominal: flat, nontender, no rebound tenderness, bowel sounds normal Skin: warm and dry, scattered maculopapular rash to abdomen and arms Neurological: alert, no focal deficit Psychiatric: normal mood and affect  Assessment/Plan: James [name] is a 59 y.o. male with hx of HTN, alcohol use, DM, COPD on 3.5L at home presenting with SOB above baseline. Denies fever, chills, productive cough. CXR with RLL infiltrates, has been hospitalized many times since 2015 for pneumonia in the same location. Improving SOB.  Active Problems:   Alcohol use disorder, moderate, dependence (HCC)   Hypertension   COPD exacerbation (HCC)   Pneumonia   Chronic respiratory failure with hypoxia (HCC)   Type 2 diabetes mellitus without complication, without long-term current use of insulin ([place])  COPD exacerbation Recurrent pneumonia Bronchiectasis on bronchoscopy Presents with increased SOB, but  cough baseline and not productive. PFTs in April 2021 with FEV1/FVC which was 54.01% of expected, improved to 61.87% after bronchodilator. His PCP and also pulmonologist has him on Dulera, Spiriva, and albuterol and also prednisone 40mg  daily, states James [name] has been compliant with all these. Hx of frequent R lower and middle lobe pnuemonia as far back as 2015. Esophagram on 07/06/19 with no stenosis but severe reflux which could be contributing to COPD. bronchoscopy 08/10/2018 for recurrent PNU demonstrating severe chronic bronchitis and bronchiectasis, neutrophil predominance and transbronchial biopsies with benign lung tissue with inflammation. CT on 07/05/19 with no ILD, no centrally obstructing lesion. Other pulm workup significant for IgE WNL, allergen profile for mold equivocal for Candida albicans but negative for all others, negative hypersensitivity pneumonitis panel. Chronic alcohol use may be contributing to aspiration as well. -continue ceftriaxone and azithromycin, day 3 -prednisone 40mg , this is his home dose -Protonix 40mg  daily -continue home Dulera -continue incruse ellipta  -contnue albuterol prn -daily CBC   Alcohol Use Disorder  Reports using 2x 24oz beers per day, which is decreased from prior. No hx of withdrawal seizures. Improving, required only 2mg  Ativan over past day -contnue CIWA protocol with Ativan -PO thiamine 100mg   Type II DM On metformin 500mg  BID. A1c 6.9. -continue SSI   GERD Esophogram 7/2 with severe reflux to the level of the cervical esophagus.  -continue protonix 40 mg daily.   Essential HTN -contnue home losartan 100mg  and Toprol 25mg    Skin rash On  triamcinolone cream by PCP, patient reports improvement in itching while using this though the rash has not resolved.  -resume triamcinolone -derm referral on discharge  Diet: heart healthy carb modified IVF: none VTE: lovenox Prior to Admission Living Arrangement: home Anticipated Discharge Location:  home Barriers to Discharge: pneumonia Dispo: Anticipated discharge in approximately 0-1 day(s).   Andrew Au, MD 09/24/2019, 6:41 AM Pager: [phone] After 5pm on weekdays and 1pm on weekends: On Call pager [phone]     Internal Medicine Attending Attestation:   I have seen and evaluated this patient and I have discussed the plan of care with the house staff. Please see their note for complete details. I concur with their findings with the following additions/corrections:   Reports his shortness of breath is improving with antibiotics and steroids as well as breathing treatments.  Requiring some doses of lorazepam for alcohol withdrawal.  Regarding his respiratory symptoms, I am not convinced that James [name] has pneumonia.  Although James [name] has a clear opacity on his CXR, this appears to be persistent from many prior studies and may represent scarring related to his bronchiectasis.  In the absence of fever or productive cough, I do not think James [name] is benefiting from the ceftriaxone and would continue with treatment for possible COPD exacerbation.  James [name] is on home oxygen and feels ready to go home, so as soon as we have gotten him through his alcohol withdrawal, we can discharge him home.  Oda Kilts, MD 09/24/2019, 4:56 PM

## 2019-09-24 NOTE — Consult Note (Signed)
   [facility] CM Inpatient Consult   09/24/2019  James [name] 1961-01-01 [id number]   Insurance: Managed Medicaid, [employer]  Patient has been followed by a Garment/textile technologist.  Patient is also followed by the Advanced [facility] Para Medicine team.   Plan:  Will follow for progress as patient is high risk for unplanned readmissions.  Natividad Brood, RN BSN [facility] Liaison  [phone] business mobile phone Toll free office [phone]  Fax number: [phone] [email or web address]

## 2019-09-24 NOTE — Progress Notes (Deleted)
Subjective:    CIWA scores improved, max of 7 over past day, required only 2mg  Ativan.   He states that his shortness of breath has slightly improved and is not too far from his baseline. He requests oxygen while in the room and states that he uses supplemental oxygen 24/7 at home. He denies any fevers, chills, N/V, abdominal pain or any other symptoms. He noes an itchy rash on his left arm that has persisted despite cream that [name] instructed him to use.   Objective:  Vital signs in last 24 hours: Vitals:   09/24/19 0729 09/24/19 0753 09/24/19 1000 09/24/19 1146  BP: (!) 134/96 (!) 134/96 (!) 134/96 128/74  Pulse: 80 86 99 81  Resp: 19 19  19   Temp: 98.5 F (36.9 C)   98.7 F (37.1 C)  TempSrc: Oral   Oral  SpO2: 98% 99%  96%  Weight:      Height:        Physical Exam Constitutional: no acute distress Head: atraumatic, face is ruddy ENT: external ears normal Eyes: EOMI Cardiovascular: regular rate and rhythm, telemetry with occasional short runs of SVT vs A fib, normal heart sounds Pulmonary: effort normal, lungs clear to ascultation bilaterally Abdominal: flat, nontender, no rebound tenderness, bowel sounds normal Skin: warm and dry, scattered maculopapular rash to abdomen and arms Neurological: alert, no focal deficit Psychiatric: normal mood and affect  Assessment/Plan: James [name] is a 59 y.o. male with hx of HTN, alcohol use, DM, COPD on 3.5L at home presenting with SOB above baseline. Denies fever, chills, productive cough. CXR with RLL infiltrates, has been hospitalized many times since 2015 for pneumonia in the same location. Improving SOB.  Active Problems:   Alcohol use disorder, moderate, dependence (HCC)   Hypertension   COPD exacerbation (HCC)   Pneumonia   Chronic respiratory failure with hypoxia (HCC)   Type 2 diabetes mellitus without complication, without long-term current use of insulin ([place])  COPD exacerbation Recurrent  pneumonia Bronchiectasis on bronchoscopy Presents with increased SOB, but cough baseline and not productive. PFTs in April 2021 with FEV1/FVC which was 54.01% of expected, improved to 61.87% after bronchodilator. His PCP and also pulmonologist has him on Dulera, Spiriva, and albuterol and also prednisone 40mg  daily, states he has been compliant with all these. Hx of frequent R lower and middle lobe pnuemonia as far back as 2015. Esophagram on 07/06/19 with no stenosis but severe reflux which could be contributing to COPD. bronchoscopy 08/10/2018 for recurrent PNU demonstrating severe chronic bronchitis and bronchiectasis, neutrophil predominance and transbronchial biopsies with benign lung tissue with inflammation. CT on 07/05/19 with no ILD, no centrally obstructing lesion. Other pulm workup significant for IgE WNL, allergen profile for mold equivocal for Candida albicans but negative for all others, negative hypersensitivity pneumonitis panel. Chronic alcohol use may be contributing to aspiration as well. -continue ceftriaxone and azithromycin, day 3 -prednisone 40mg , this is his home dose -Protonix 40mg  daily -continue home Dulera -continue incruse ellipta  -contnue albuterol prn -daily CBC   Alcohol Use Disorder  Reports using 2x 24oz beers per day, which is decreased from prior. No hx of withdrawal seizures. Improving, required only 2mg  Ativan over past day -contnue CIWA protocol with Ativan -PO thiamine 100mg   Arrhythmia Telemetry with occasional short runs of SVT vs A fib. Could also be MAT with his COPD. -monitor  Type II DM On metformin 500mg  BID. A1c 6.9. -continue SSI   GERD Esophogram 7/2 with severe reflux  to the level of the cervical esophagus.  -continue protonix 40 mg daily.   Essential HTN -contnue home losartan 100mg  and Toprol 25mg    Skin rash On triamcinolone cream by PCP, patient reports improvement in itching while using this though the rash has not resolved.  -resume  triamcinolone -derm referral on discharge  Diet: heart healthy carb modified IVF: none VTE: lovenox Prior to Admission Living Arrangement: home Anticipated Discharge Location: home Barriers to Discharge: pneumonia Dispo: Anticipated discharge in approximately 0-1 day(s).   Andrew Au, MD 09/24/2019, 1:05 PM Pager: [phone] After 5pm on weekdays and 1pm on weekends: On Call pager [phone]

## 2019-09-24 NOTE — Progress Notes (Signed)
SATURATION QUALIFICATIONS: (This note is used to comply with regulatory documentation for home oxygen)  Patient Saturations on Room Air at Rest = 92%  Patient Saturations on Room Air while Ambulating = 88%  Patient Saturations on 1 Liters of oxygen while Ambulating = 91%

## 2019-09-25 DIAGNOSIS — Z9981 Dependence on supplemental oxygen: Secondary | ICD-10-CM

## 2019-09-25 DIAGNOSIS — J441 Chronic obstructive pulmonary disease with (acute) exacerbation: Secondary | ICD-10-CM

## 2019-09-25 DIAGNOSIS — E119 Type 2 diabetes mellitus without complications: Secondary | ICD-10-CM

## 2019-09-25 DIAGNOSIS — J9611 Chronic respiratory failure with hypoxia: Secondary | ICD-10-CM

## 2019-09-25 DIAGNOSIS — I10 Essential (primary) hypertension: Secondary | ICD-10-CM

## 2019-09-25 DIAGNOSIS — K219 Gastro-esophageal reflux disease without esophagitis: Secondary | ICD-10-CM

## 2019-09-25 DIAGNOSIS — Z7984 Long term (current) use of oral hypoglycemic drugs: Secondary | ICD-10-CM

## 2019-09-25 DIAGNOSIS — R21 Rash and other nonspecific skin eruption: Secondary | ICD-10-CM

## 2019-09-25 DIAGNOSIS — L2089 Other atopic dermatitis: Secondary | ICD-10-CM

## 2019-09-25 DIAGNOSIS — J189 Pneumonia, unspecified organism: Secondary | ICD-10-CM

## 2019-09-25 DIAGNOSIS — F1020 Alcohol dependence, uncomplicated: Secondary | ICD-10-CM

## 2019-09-25 LAB — BASIC METABOLIC PANEL
Anion gap: 12 (ref 5–15)
BUN: 17 mg/dL (ref 6–20)
CO2: 30 mmol/L (ref 22–32)
Calcium: 9.2 mg/dL (ref 8.9–10.3)
Chloride: 97 mmol/L — ABNORMAL LOW (ref 98–111)
Creatinine, Ser: 0.79 mg/dL (ref 0.61–1.24)
GFR calc Af Amer: >60 mL/min (ref >60)
GFR calc non Af Amer: >60 mL/min (ref >60)
Glucose, Bld: 160 mg/dL — ABNORMAL HIGH (ref 70–99)
Potassium: 4 mmol/L (ref 3.5–5.1)
Sodium: 139 mmol/L (ref 135–145)

## 2019-09-25 LAB — GLUCOSE, CAPILLARY
Glucose-Capillary: 109 mg/dL — ABNORMAL HIGH (ref 70–99)
Glucose-Capillary: 175 mg/dL — ABNORMAL HIGH (ref 70–99)

## 2019-09-25 LAB — CBC
HCT: 42.8 % (ref 39.0–52.0)
Hemoglobin: 13.6 g/dL (ref 13.0–17.0)
MCH: 31.7 pg (ref 26.0–34.0)
MCHC: 31.8 g/dL (ref 30.0–36.0)
MCV: 99.8 fL (ref 80.0–100.0)
Platelets: 267 10*3/uL (ref 150–400)
RBC: 4.29 MIL/uL (ref 4.22–5.81)
RDW: 13 % (ref 11.5–15.5)
WBC: 12.2 10*3/uL — ABNORMAL HIGH (ref 4.0–10.5)
nRBC: 0 % (ref 0.0–0.2)

## 2019-09-25 MED ORDER — ALBUTEROL SULFATE HFA 108 (90 BASE) MCG/ACT IN AERS
2.0000 | INHALATION_SPRAY | Freq: Two times a day (BID) | RESPIRATORY_TRACT | Status: DC
  Administered 2019-09-25: 2 via RESPIRATORY_TRACT
  Filled 2019-09-25: qty 6.7

## 2019-09-25 MED ORDER — AZITHROMYCIN 250 MG PO TABS: 250.0000 mg | ORAL_TABLET | Freq: Every day | ORAL | 0 refills | Status: AC

## 2019-09-25 MED FILL — AZITHROMYCIN 250 MG TABLET: 250 | 2 days supply | Qty: 2 | Fill #0

## 2019-09-25 NOTE — Discharge Summary (Signed)
Name: James [name] MRN: [id number] DOB: Apr 28, 1960 59 y.o. PCP: [name], MD  Date of Admission: 09/22/2019  3:42 AM Date of Discharge: 09/25/2019 Attending Physician: Lenice Pressman, MD PhD  Discharge Diagnosis: 1. COPD exacerbation, underlying bronchiectasis 2. Alcohol use disorder 3. Skin rash  Discharge Medications: Allergies as of 09/25/2019      Reactions   Gabapentin Other (See Comments)   hallucinations      Medication List    TAKE these medications   Accu-Chek Softclix Lancets lancets Use as instructed to check blood sugar once daily.   albuterol (2.5 MG/3ML) 0.083% nebulizer solution Commonly known as: PROVENTIL Take 3 mLs (2.5 mg total) by nebulization every 6 (six) hours as needed for wheezing or shortness of breath.   albuterol 108 (90 Base) MCG/ACT inhaler Commonly known as: ProAir HFA Inhale 2 puffs into the lungs every 6 (six) hours as needed for wheezing or shortness of breath.   azithromycin 250 MG tablet Commonly known as: ZITHROMAX Take 1 tablet (250 mg total) by mouth daily for 2 days.   Flutter Devi Use 4 times daily   folic acid 1 MG tablet Commonly known as: FOLVITE Take 1 tablet (1 mg total) by mouth daily.   losartan 100 MG tablet Commonly known as: COZAAR Take 1 tablet (100 mg total) by mouth daily.   metFORMIN 500 MG tablet Commonly known as: GLUCOPHAGE Take 1 tablet (500 mg total) by mouth 2 (two) times daily with a meal.   metoprolol succinate 25 MG 24 hr tablet Commonly known as: TOPROL-XL Take 1 tablet (25 mg total) by mouth daily.   mometasone-formoterol 200-5 MCG/ACT Aero Commonly known as: DULERA Inhale 2 puffs into the lungs 2 (two) times daily.   pantoprazole 40 MG tablet Commonly known as: PROTONIX Take 1 tablet (40 mg total) by mouth daily.   predniSONE 20 MG tablet Commonly known as: DELTASONE Take 1 tablet (20 mg total) by mouth daily with breakfast. Take 4 tablets daily for 5 days then 3 tablets  for 5 days then two tablets a day and stay What changed: additional instructions   Spiriva Respimat 2.5 MCG/ACT Aers Generic drug: Tiotropium Bromide Monohydrate Two puff daily   thiamine 100 MG tablet Commonly known as: Vitamin B-1 Take 1 tablet (100 mg total) by mouth daily.   triamcinolone cream 0.1 % Commonly known as: KENALOG Apply 1 application topically 2 (two) times daily.   True Metrix Blood Glucose Test test strip Generic drug: glucose blood Use as instructed   True Metrix Meter w/Device Kit Use to measure blood sugar twice a day       Disposition and follow-up:   James [name] was discharged from [facility] in Stable condition.  At the hospital follow up visit please address:  1.  Follow up      A. COPD exacerbation, recurrent pneumonias, underlying bronchiectasis - hx of recurrent pneumonia, see hospital course for details on prior workup, follow up with his pulmonologist      B. Alcohol use disorder - had mild withdrawal symptoms, motivated to quit, consider pharmacologic assistance such as naltrexone      C. Skin rash - not improving with triamcinolone, referred to dermatology  2.  Labs / imaging needed at time of follow-up: skin biopsy  3.  Pending labs/ test needing follow-up: none  Follow-up Appointments:   Hospital Course by problem list:  COPD exacerbation Recurrent pneumonia Bronchiectasis on bronchoscopy Presents with increased SOB, but cough  baseline and not productive. He has had similar symptoms many times previously. PFTs in April 2021 with FEV1/FVC which was 54.01% of expected. CXR this admission with vague R lower lung field opacity, same location as opacity on prior CXRs. Has had recurrent pneumonia as far back as 2015. bronchoscopy 08/10/2018 for recurrent PNU demonstrating severe chronic bronchitis and bronchiectasis, neutrophil predominance and transbronchial biopsies with benign lung tissue with inflammation. CT on  07/05/19 with no ILD, no centrally obstructing lesion. Esophagram on 07/06/19 with no stenosis but severe reflux which could be contributing to COPD. Other pulm workup significant for IgE WNL, allergen profile for mold equivocal for Candida albicans but negative for all others, negative hypersensitivity pneumonitis panel. Has very good outpatient follow up with pulmonology. Is on Dulera, Spiriva, and albuterol and also prednisone 57m daily, states he has been compliant with all these, though at one point earlier this admission denied steroid use. Treated with ceftriaxone, azithromycin, and prednisone initially with rapid improvement in SOB. No fevers this admission, cough remains unproductive so did not culture. Discharged on his home Dulera, Spiriva, albuterol, Prednisone. Discharged with oral azithromycin to complete 5 day course.    Alcohol Use Disorder  Reports using 2x 24oz beers per day, which is decreased from prior. No hx of withdrawal seizures. CIWA scores max of 15 indicating mild withdrawal, requiring relatively low amounts of Ativan. On day of discharge had scores of up to 5. Patient is motivated to quit drinking because "I want to live longer." Discharged with PO thiamine.    Skin rash Reports a rash for the past several months on his abdomen and both arms. His PCP has been treating this with triamcinolone, but this has not really helped. Referral provided to dermatology for biopsy/further assessment.   Discharge Vitals:   BP (!) 146/92 (BP Location: Right Arm)   Pulse 88   Temp 98.1 F (36.7 C) (Oral)   Resp 16   Ht 5' 9.5" (1.765 m)   Wt 82.1 kg   SpO2 94%   BMI 26.35 kg/m   Pertinent Labs, Studies, and Procedures:  DG Chest Portable 1 View  Result Date: 09/22/2019 CLINICAL DATA:  Dyspnea. COPD exacerbation. Chest pain and shortness of breath. EXAM: PORTABLE CHEST 1 VIEW COMPARISON:  Cyst 07/05/2019 FINDINGS: Stable cardiomediastinal contours. Chronic blunting of the left  costophrenic angle due to pleuroparenchymal scarring. Changes of emphysema noted with asymmetric increase hazy opacity within the right lung base concerning for pneumonia. Numerous remote bilateral rib fracture deformities. IMPRESSION: 1. Increased hazy opacity within the right lung base concerning for pneumonia. 2. Emphysema. Electronically Signed   By: TKerby MoorsM.D.   On: 09/22/2019 04:20    Discharge Instructions: Discharge Instructions    Ambulatory referral to Dermatology   Complete by: As directed    Rash on bilateral arms and abdomen, itchy, PCP treated with triamcinolone without improvement   Call MD for:  extreme fatigue   Complete by: As directed    Call MD for:  persistant dizziness or light-headedness   Complete by: As directed    Diet - low sodium heart healthy   Complete by: As directed    Discharge instructions   Complete by: As directed    James [name] is has been a pleasure taking care of you. We treated you for COPD exacerbation, pneumonia, and alcohol withdrawal. Here are your discharge instructions.  1) START azithromycin, one tablet daily for 2 days 2) CONTINUE your home prednisone 465mdaily 3) follow up  with your pulmonologist 4) referral made to dermatology for your rash, they will call with an appointment 5) Do your best to cut back or quit using alcohol   Increase activity slowly   Complete by: As directed       Signed: Andrew Au, MD 09/25/2019, 4:58 PM   Pager: [phone]

## 2019-09-25 NOTE — Discharge Instructions (Signed)
Chronic Obstructive Pulmonary Disease Exacerbation  Chronic obstructive pulmonary disease (COPD) is a long-term (chronic) condition that affects the lungs. COPD is a general term that can be used to describe many different lung problems that cause lung swelling (inflammation) and limit airflow, including chronic bronchitis and emphysema. COPD exacerbations are episodes when breathing symptoms become much worse and require extra treatment. COPD exacerbations are usually caused by infections. Without treatment, COPD exacerbations can be severe and even life threatening. Frequent COPD exacerbations can cause further damage to the lungs. What are the causes? This condition may be caused by:  Respiratory infections, including viral and bacterial infections.  Exposure to smoke.  Exposure to air pollution, chemical fumes, or dust.  Things that give you an allergic reaction (allergens).  Not taking your usual COPD medicines as directed.  Underlying medical problems, such as congestive heart failure or infections not involving the lungs. In many cases, the cause (trigger) of this condition is not known. What increases the risk? The following factors may make you more likely to develop this condition:  Smoking cigarettes.  Old age.  Frequent prior COPD exacerbations. What are the signs or symptoms? Symptoms of this condition include:  Increased coughing.  Increased production of mucus from your lungs (sputum).  Increased wheezing.  Increased shortness of breath.  Rapid or labored breathing.  Chest tightness.  Less energy than usual.  Sleep disruption from symptoms.  Confusion or increased sleepiness. Often these symptoms happen or get worse even with the use of medicines. How is this diagnosed? This condition is diagnosed based on:  Your medical history.  A physical exam. You may also have tests, including:  A chest X-ray.  Blood tests.  Lung (pulmonary) function  tests. How is this treated? Treatment for this condition depends on the severity and cause of the symptoms. You may need to be admitted to a hospital for treatment. Some of the treatments commonly used to treat COPD exacerbations are:  Antibiotic medicines. These may be used for severe exacerbations caused by a lung infection, such as pneumonia.  Bronchodilators. These are inhaled medicines that expand the air passages and allow increased airflow.  Steroid medicines. These act to reduce inflammation in the airways. They may be given with an inhaler, taken by mouth, or given through an IV tube inserted into one of your veins.  Supplemental oxygen therapy.  Airway clearing techniques, such as noninvasive ventilation (NIV) and positive expiratory pressure (PEP). These provide respiratory support through a mask or other noninvasive device. An example of this would be using a continuous positive airway pressure (CPAP) machine to improve delivery of oxygen into your lungs. Follow these instructions at home: Medicines  Take over-the-counter and prescription medicines only as told by your health care provider. It is important to use correct technique with inhaled medicines.  If you were prescribed an antibiotic medicine or oral steroid, take it as told by your health care provider. Do not stop taking the medicine even if you start to feel better. Lifestyle  Eat a healthy diet.  Exercise regularly.  Get plenty of sleep.  Avoid exposure to all substances that irritate the airway, especially to tobacco smoke.  Wash your hands often with soap and water to reduce the risk of infection. If soap and water are not available, use hand sanitizer.  During flu season, avoid enclosed spaces that are crowded with people. General instructions  Drink enough fluid to keep your urine clear or pale yellow (unless you have a medical  condition that requires fluid restriction).  Use a cool mist vaporizer. This  humidifies the air and makes it easier for you to clear your chest when you cough.  If you have a home nebulizer and oxygen, continue to use them as told by your health care provider.  Keep all follow-up visits as told by your health care provider. This is important. How is this prevented?  Stay up-to-date on pneumococcal and influenza (flu) vaccines. A flu shot is recommended every year to help prevent exacerbations.  Do not use any products that contain nicotine or tobacco, such as cigarettes and e-cigarettes. Quitting smoking is very important in preventing COPD from getting worse and in preventing exacerbations from happening as often. If you need help quitting, ask your health care provider.  Follow all instructions for pulmonary rehabilitation after a recent exacerbation. This can help prevent future exacerbations.  Work with your health care provider to develop and follow an action plan. This tells you what steps to take when you experience certain symptoms. Contact a health care provider if:  You have a worsening of your regular COPD symptoms. Get help right away if:  You have worsening shortness of breath, even when resting.  You have trouble talking.  You have severe chest pain.  You cough up blood.  You have a fever.  You have weakness, vomit repeatedly, or faint.  You feel confused.  You are not able to sleep because of your symptoms.  You have trouble doing daily activities. Summary  COPD exacerbations are episodes when breathing symptoms become much worse and require extra treatment above your normal treatment.  Exacerbations can be severe and even life threatening. Frequent COPD exacerbations can cause further damage to your lungs.  COPD exacerbations are usually triggered by infections such as the flu, colds, and even pneumonia.  Treatment for this condition depends on the severity and cause of the symptoms. You may need to be admitted to a hospital for  treatment.  Quitting smoking is very important to prevent COPD from getting worse and to prevent exacerbations from happening as often. This information is not intended to replace advice given to you by your health care provider. Make sure you discuss any questions you have with your health care provider. Document Revised: 12/03/2016 Document Reviewed: 01/26/2016 Elsevier Patient Education  2020 [employer].

## 2019-09-25 NOTE — Progress Notes (Addendum)
Subjective:   Patient states that his shortness of breath has improved since yesterday. His chest tightness has also improved. He states that he uses 3L of oxygen 24/7 at home at baseline. He denies any other complaints at this time. He states he will try to stop drinking alcohol and had been trying to cut down on his drinking prior to this hospitalization.  Objective:  Vital signs in last 24 hours: Vitals:   09/25/19 0143 09/25/19 0700 09/25/19 0758 09/25/19 0809  BP:  125/69  (!) 153/96  Pulse:    84  Resp:  (!) 21  19  Temp:  98.2 F (36.8 C)  98.1 F (36.7 C)  TempSrc:  Oral  Oral  SpO2: 94% 94% 94% 95%  Weight:      Height:        Physical Exam Constitutional: no acute distress Head: atraumatic, face is [name] ENT: external ears normal Eyes: EOMI Cardiovascular: regular rate and rhythm, normal heart sounds Pulmonary: effort normal, lungs clear to ascultation bilaterally Abdominal: flat, nontender, no rebound tenderness, bowel sounds normal Skin: warm and dry, scattered maculopapular rash to abdomen and arms Neurological: alert, no focal deficit Psychiatric: normal mood and affect  Assessment/Plan: James [name] is a 59 y.o. male with hx of HTN, alcohol use, DM, COPD on 3.5L at home presenting with SOB above baseline. Denies fever, chills, productive cough. CXR with RLL infiltrates, has been hospitalized many times since 2015 for pneumonia in the same location. Improving SOB, tolerated ambulation yesterday with acceptable pulse ox readings.  Principal Problem:   COPD exacerbation ([place]) Active Problems:   Alcohol use disorder, moderate, dependence ([place])   Hypertension   Chronic respiratory failure with hypoxia ([facility])   Type 2 diabetes mellitus without complication, without long-term current use of insulin ([place])   Bronchiectasis ([place])  COPD exacerbation Recurrent pneumonia Bronchiectasis on bronchoscopy Presents with increased SOB, but cough baseline and not  productive. PFTs in April 2021 with FEV1/FVC which was 54.01% of expected, improved to 61.87% after bronchodilator. His PCP and also pulmonologist has him on Dulera, Spiriva, and albuterol and also prednisone 40mg  daily, states he has been compliant with all these. Hx of frequent R lower and middle lobe pnuemonia as far back as 2015. Esophagram on 07/06/19 with no stenosis but severe reflux which could be contributing to COPD. bronchoscopy 08/10/2018 for recurrent PNU demonstrating severe chronic bronchitis and bronchiectasis, neutrophil predominance and transbronchial biopsies with benign lung tissue with inflammation. CT on 07/05/19 with no ILD, no centrally obstructing lesion. Other pulm workup significant for IgE WNL, allergen profile for mold equivocal for Candida albicans but negative for all others, negative hypersensitivity pneumonitis panel. Chronic alcohol use may be contributing to aspiration as well. -stop ceftriaxone -continue azithromycin, day 4 -prednisone 40mg , this is his home dose -Protonix 40mg  daily -continue home Dulera -continue incruse ellipta  -contnue albuterol prn -daily CBC   Alcohol Use Disorder  Reports using 2x 24oz beers per day, which is decreased from prior. No hx of withdrawal seizures. Improving CIWA scores. Improving, required only 1mg  <- 2mg  Ativan over past day -contnue CIWA protocol with Ativan -PO thiamine 100mg   Arrhythmia Telemetry with occasional short runs of SVT vs A fib. Could also be MAT with his COPD. -monitor  Type II DM On metformin 500mg  BID. A1c 6.9. -continue SSI   GERD Esophogram 7/2 with severe reflux to the level of the cervical esophagus.  -continue protonix 40 mg daily.   Essential HTN -contnue home  losartan 100mg  and Toprol 25mg    Skin rash On triamcinolone cream by PCP, patient reports improvement in itching while using this though the rash has not resolved.  -resume triamcinolone -derm referral on discharge  Diet: heart healthy  carb modified IVF: none VTE: lovenox Prior to Admission Living Arrangement: home Anticipated Discharge Location: home Barriers to Discharge: stable for discharge Dispo: Anticipated discharge in approximately 0 day(s).   Andrew Au, MD 09/25/2019, 10:29 AM Pager: [phone] After 5pm on weekdays and 1pm on weekends: On Call pager [phone]

## 2019-09-26 ENCOUNTER — Other Ambulatory Visit: Payer: Self-pay

## 2019-09-26 ENCOUNTER — Telehealth: Payer: Self-pay

## 2019-09-26 NOTE — Patient Outreach (Signed)
Care Coordination  09/26/2019  James [name] [birth year] [id number]   Transition of care:  Patient was reassigned to this case manager.   Admission to [facility] for COPD on 09/22/2019 and discharged on 09/25/2019.  Placed call to patient and explained [facility] program. Patient has agreed to participate in program.  Reviewed with patient I would contact him weekly for transition of care since he was discharged home from hospital yesterday. Patient agrees to participate.    Patient reports that he is feeling good today. Reports he has all his medications and is taking them as prescribed. Reviewed with patient that his primary MD is [name] and that he has a follow up planned in 1 month. Reviewed with patient the importance of timely follow up post discharge. Patient reports to me that he is eating and sleeping well. Reports that he uses his CPAP during the day and at night. Reports that he does not smoke and quit in 2014.  Reports he has family support.     PLAN: I will send successful outreach letter and new patient packet. Patient agreed to call in 3 business days for follow up. Encouraged patient to continue to take his medications as prescribed and call MD for any changes in condition. Reviewed importance of early recognition of worsening symptoms.  Will plan telephone follow up on 10/01/2019.  Tomasa Rand, RN, BSN, CEN [facility] [employer] [phone]

## 2019-09-26 NOTE — Telephone Encounter (Addendum)
Transition Care Management Follow-up Telephone Call  Date of discharge and from where: 09/25/2019, [facility]   How have you been since you were released from the hospital? He said he is " doing pretty good."   Any questions or concerns?  none at this time   Items Reviewed:  Did the pt receive and understand the discharge instructions provided? yes. His sister will review for him due to his low literacy.   Medications obtained and verified?  he said that he has all of his medications including the antibiotic and the prednisone.  He did not have any questions about the meds.   Message  left for patient's sister, [name] # [phone] to inquire if she has any questions about his medications. She oversees the management of his meds. Call back requested to this CM.    Any new allergies since your discharge?  none reported   Do you have support at home?  lives alone but his sister, [name] lives about a mile away from him and he sees her  every day.   He has a CPAP that he says works wonders,   He also has a nebulizer and a glucometer  He now receives disability, medicaid and food stamps.   He has been discharged from the community paramedicine program as he has reached goals with their program. Structural improvements have been made to his home.  There is still more that needs to be done. He feels comfortable in this home and does not want to move. He was appreciative of all of the support that [name], EMT has provided.   Functional Questionnaire: (I = Independent and D = Dependent) ADLs: independent but his sister assists with medication management   Follow up appointments reviewed:   PCP Hospital f/u appt confirmed? [name] 11/06/2019, the patient was offered to be scheduled sooner with another provider but he only wants to see [name].   [facility] f/u appt confirmed? none at this time  Are transportation arrangements needed?  no  If their condition  worsens, is the pt aware to call PCP or go to the Emergency Dept.?  yes  Was the patient provided with contact information for the PCP's office or ED?  he has the phone number for the clinic  Was to pt encouraged to call back with questions or concerns?   Yes

## 2019-09-27 LAB — CULTURE, BLOOD (ROUTINE X 2)
Culture: NO GROWTH
Culture: NO GROWTH
Special Requests: ADEQUATE

## 2019-09-28 ENCOUNTER — Other Ambulatory Visit: Payer: Self-pay

## 2019-09-28 NOTE — Patient Outreach (Signed)
Care Coordination  09/28/2019  James [name] 1960/07/04 [id number]   Per leadership, patient care to be transferred to managed medicaid team.   Letter mail. I was instructed to cancel pending appointment until managed medicaid team takes over care.   Tomasa Rand, RN, BSN, CEN [facility] [employer] [phone]

## 2019-10-01 ENCOUNTER — Ambulatory Visit: Payer: Self-pay

## 2019-10-01 ENCOUNTER — Telehealth: Payer: Self-pay | Admitting: Critical Care Medicine

## 2019-10-01 NOTE — Telephone Encounter (Signed)
James [name], "pts caregiver", called upset because the pt still has not received an antibiotic. He states that the pt is on the verge of death and is needing an antibiotic desperately due to not having one for a week. He states that he does not feel the pt is a priority because he is not a foreigner and that the doctor does not care about the pt because this has not been done yet. He is requesting to have this done ASAP and is requesting a call back. James [name] states that he is already in contact with his attorney and plans to file a law suit if this is not done in a timely manner. Please advise.

## 2019-10-01 NOTE — Telephone Encounter (Signed)
Spoke w/ pt's sister, [name] (on pt's DPR) and she reports that pt needs extension of antibiotic as he is getting worse, having swelling all over body and increased SHOB requiring constant O2 use, reports pt still taking Prednisone. Per PCP consult pt added to provider's schedule for tomorrow @ 8:30am, pt's sister aware of appt and will bring him.

## 2019-10-01 NOTE — Telephone Encounter (Signed)
Pt.'s "caregiver, Marguerite Olea" calling to report pt. Was in the hospital with pneumonia and was discharged home "6 days ago with no antibiotics." States pt. Still has productive cough with yellow, brown mucus. Using oxygen at home.No fever. Shortness of breath and mild wheezing. Pt. Asking for antibiotics to be sent to his pharmacy. Please advise pt.  Answer Assessment - Initial Assessment Questions 1. ONSET: "When did the cough begin?"      2 weeks ago 2. SEVERITY: "How bad is the cough today?"      Moderate 3. SPUTUM: "Describe the color of your sputum" (none, dry cough; clear, white, yellow, green)     Yellow 4. HEMOPTYSIS: "Are you coughing up any blood?" If so ask: "How much?" (flecks, streaks, tablespoons, etc.)     No 5. DIFFICULTY BREATHING: "Are you having difficulty breathing?" If Yes, ask: "How bad is it?" (e.g., mild, moderate, severe)    - MILD: No SOB at rest, mild SOB with walking, speaks normally in sentences, can lay down, no retractions, pulse < 100.    - MODERATE: SOB at rest, SOB with minimal exertion and prefers to sit, cannot lie down flat, speaks in phrases, mild retractions, audible wheezing, pulse 100-120.    - SEVERE: Very SOB at rest, speaks in single words, struggling to breathe, sitting hunched forward, retractions, pulse > 120      Weak, moderate 6. FEVER: "Do you have a fever?" If Yes, ask: "What is your temperature, how was it measured, and when did it start?"     No 7. CARDIAC HISTORY: "Do you have any history of heart disease?" (e.g., heart attack, congestive heart failure)      No 8. LUNG HISTORY: "Do you have any history of lung disease?"  (e.g., pulmonary embolus, asthma, emphysema)     COPD 9. PE RISK FACTORS: "Do you have a history of blood clots?" (or: recent major surgery, recent prolonged travel, bedridden)     No 10. OTHER SYMPTOMS: "Do you have any other symptoms?" (e.g., runny nose, wheezing, chest pain)       Wheezing, chest tightness 11. PREGNANCY:  "Is there any chance you are pregnant?" "When was your last menstrual period?"       n/a 12. TRAVEL: "Have you traveled out of the country in the last month?" (e.g., travel history, exposures)       No  Protocols used: [place]

## 2019-10-01 NOTE — Telephone Encounter (Signed)
Per pt's hospital d/c summary, Zithromax and Prednisone prescribed at hospital likely completed last week, this is why pt currently not on antibiotic, pls advise.

## 2019-10-02 ENCOUNTER — Encounter: Payer: Self-pay | Admitting: Critical Care Medicine

## 2019-10-02 ENCOUNTER — Ambulatory Visit: Payer: MEDICAID | Attending: Critical Care Medicine | Admitting: Critical Care Medicine

## 2019-10-02 ENCOUNTER — Other Ambulatory Visit: Payer: Self-pay | Admitting: Critical Care Medicine

## 2019-10-02 ENCOUNTER — Other Ambulatory Visit: Payer: Self-pay

## 2019-10-02 ENCOUNTER — Telehealth: Payer: Self-pay

## 2019-10-02 VITALS — BP 144/77 | HR 110 | Temp 98.6°F | Ht 69.0 in | Wt 191.4 lb

## 2019-10-02 DIAGNOSIS — E119 Type 2 diabetes mellitus without complications: Secondary | ICD-10-CM

## 2019-10-02 DIAGNOSIS — L2089 Other atopic dermatitis: Secondary | ICD-10-CM

## 2019-10-02 DIAGNOSIS — J9611 Chronic respiratory failure with hypoxia: Secondary | ICD-10-CM

## 2019-10-02 DIAGNOSIS — I10 Essential (primary) hypertension: Secondary | ICD-10-CM

## 2019-10-02 DIAGNOSIS — J441 Chronic obstructive pulmonary disease with (acute) exacerbation: Secondary | ICD-10-CM

## 2019-10-02 LAB — GLUCOSE, POCT (MANUAL RESULT ENTRY): POC Glucose: 147 mg/dl — AB (ref 70–99)

## 2019-10-02 MED ORDER — VITAMIN B-1 100 MG PO TABS
100.0000 mg | ORAL_TABLET | Freq: Every day | ORAL | 3 refills | Status: DC
Start: 2019-10-02 — End: 2019-11-13

## 2019-10-02 MED ORDER — PANTOPRAZOLE SODIUM 40 MG PO TBEC
40.0000 mg | DELAYED_RELEASE_TABLET | Freq: Every day | ORAL | 6 refills | Status: DC
Start: 2019-10-02 — End: 2020-04-14

## 2019-10-02 MED ORDER — MOMETASONE FURO-FORMOTEROL FUM 200-5 MCG/ACT IN AERO
2.0000 | INHALATION_SPRAY | Freq: Two times a day (BID) | RESPIRATORY_TRACT | 0 refills | Status: DC
Start: 2019-10-02 — End: 2019-11-13

## 2019-10-02 MED ORDER — PREDNISONE 20 MG PO TABS
20.0000 mg | ORAL_TABLET | Freq: Every day | ORAL | 1 refills | Status: DC
Start: 2019-10-02 — End: 2019-11-19

## 2019-10-02 MED ORDER — MOXIFLOXACIN HCL 400 MG PO TABS: 400.0000 mg | ORAL_TABLET | Freq: Every day | ORAL | 0 refills | Status: AC

## 2019-10-02 MED ORDER — FOLIC ACID 1 MG PO TABS
1.0000 mg | ORAL_TABLET | Freq: Every day | ORAL | 0 refills | Status: DC
Start: 2019-10-02 — End: 2020-02-13

## 2019-10-02 MED ORDER — LOSARTAN POTASSIUM 100 MG PO TABS: 100.0000 mg | ORAL_TABLET | Freq: Every day | ORAL | 2 refills | Status: DC

## 2019-10-02 MED ORDER — METFORMIN HCL 500 MG PO TABS: 500.0000 mg | ORAL_TABLET | Freq: Two times a day (BID) | ORAL | Status: DC

## 2019-10-02 MED ORDER — ALBUTEROL SULFATE (2.5 MG/3ML) 0.083% IN NEBU: 2.5000 mg | INHALATION_SOLUTION | Freq: Four times a day (QID) | RESPIRATORY_TRACT | 0 refills | Status: DC | PRN

## 2019-10-02 MED FILL — ALBUTEROL SUL 2.5 MG/3 ML S: (2.5 MG/3ML | 12 days supply | Qty: 150 | Fill #0

## 2019-10-02 MED FILL — predniSONE 20 MG TABS: 20 | 30 days supply | Qty: 30 | Fill #0

## 2019-10-02 MED FILL — MOXIFLOXACIN HCL 400 MG TAB: 400 | 5 days supply | Qty: 5 | Fill #0

## 2019-10-02 NOTE — Progress Notes (Signed)
States that he can not breath good.  Medication refills.

## 2019-10-02 NOTE — Assessment & Plan Note (Signed)
Continue current blood pressure protocol medication with losartan 100 mg daily

## 2019-10-02 NOTE — Progress Notes (Signed)
Subjective:    Patient ID: James [name], male    DOB: 1960/02/16, 59 y.o.   MRN: [id number]  08/23/18 59 y.o.M with advanced Copd   I saw this patient a week ago and prescribed antibiotics and prednisone for what I perceived to be persisting pneumonia.  The patient had a slight improvement but then worsened and came to the emergency room the end of the week and was admitted for 3 days for COPD exacerbation and lower lobe pneumonia.  Chest x-ray did show persistent infiltrates in the lower lobes.  The patient is now discharged and returns to the office for post hospital follow-up.  Below is the discharge summary Dc summary : Admit date: 07/31/2018 Discharge date: 08/01/2018  Time spent: 45 minutes  Recommendations for Outpatient Follow-up:  Patient will be discharged to home.  Patient will need to follow up with primary care provider within one week of discharge.  Patient should continue medications as prescribed.  Patient should follow a heart healthy diet.   Discharge Diagnoses:  Acute on chronic respiratory failure with hypoxia secondary to COPD Exacerbation and pneumonia Essential hypertension  Discharge Condition: Stable  Diet recommendation: heart healthy  Filed Weights  07/31/18 0231 07/31/18 0602 Weight: 85.3 kg 83.4 kg   History of present illness:  on 07/31/2018 by Dr. [name] a 59 y.o.malewith medical history significant ofCOPD. Patient has had 3 prior admits for COPD / PNA thus far this year, most recently in May.  Patient has been being treated for COPD exacerbation and BLL CAP as outpatient by [name] with levaquin and prednisone. Just finished levaquin yesterday. Prednisone remained at $RemoveBef'40mg'JFJWyEbOvJ$  daily with plans to initiate a slow taper.  Despite this, patient developed severe SOB and respiratory distress this evening. EMS called and patient noted to be satting 80% on RA. He is not on chronic O2 for his COPD.  Hospital  Course:  Acute on chronic respiratory failure with hypoxia secondary to COPD Exacerbation and pneumonia -Patient noted to have oxygen saturations in the 80s on room air -Chest x-ray noted for pneumonia -Was recently placed on Levaquin and steroids by PCP approximately 1 week ago -On admission, patient continued to have wheezing well into his hospital stay day 1.  Patient currently still has some wheezing however has improved. -Was placed on Solu-Medrol, along with nebulizer treatments, azithromycin and ceftriaxone -Patient did require oxygen upon admission however has been able to ambulate and maintain oxygen saturations in the high 90s on room air. -Will discharge patient with prednisone taper, azithromycin and Ceftin. -Given that patient had oxygen saturations in the 80s on admission, it was thought that he would require several days in the hospital. Patient actually improved quicker than expected.  Essential hypertension -Continue losartan  Since discharge the patient is tried to go back to work but has extreme difficulty in the heat with this.  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  10/05/2018 This patient's not been seen since the end of July he had failed several follow-up visits.  He did see Dr. [name] end of August who did not make major changes in his  medication program.  He is to remain on [name] and Incruse.  The patient after being admitted in July got readmitted early August and then again in early September.  Discharge summaries are as noted below.  During the August admission the patient underwent bronchoscopy and had mucous plugging removed the culture showed  normal flora Note this patient has not been actively smoking recently Admit date: 08/09/2018 Discharge date: 08/11/2018  Discharge Diagnoses:  Acute on chronic hypoxic respiratory failure/sepsis due to multifocal pneumonia COPD Alcohol/marijuana use Essential hypertension  History of present illness:  on 08/09/2018 by Dr. [name] a 59 y.o.malewith medical history significant ofCOPD and ETOH dependence presenting with respiratory distress.He woke up about 530 this AM and couldn't breathe. This just keeps happening. He felt similarly when hospitalized last week. He has been doing fine until today. No h/o OSA, not on CPAP. Not on home O2. +cough, productive of yellowish sputum. It is sometimes hard to get the sputum up. No fever. +wheezing since his last hospitalization. Quit smoking in 2014. He lives in a non-air-conditioned log cabin.  He was hospitalized from 7/27-28 for acute on chronic respiratory failure associated with COPD exacerbation and PNA. This was his 4th admission this year. He was seen by [name] on 7/30 with plan for completion of course of Keflex and prednisone, as well as prednisone taper.  Hospital Course:  Acute on chronic hypoxic respiratory failure/sepsis due to multifocal pneumonia -Was recently hospitalized in late July for pneumonia and COPD exacerbation. Was discharged with antibiotics as well as prednisone taper. Patient followed up with [name] on 08/03/2018 with completion of Keflex and prednisone as well. -This is his fourth admission this year for similar presentation -Patient presented with cough and shortness of breath -Chest x-rayon admissionfindings consistent with right-sided multifocal pneumonia -CTA chest showed multifocal pneumonia on the right. COPD including emphysema. Negative for PE. -COVID negative -Upon admission, patient had leukocytosis, fever, tachycardia, tachypnea- all resolving  -Noted to be  hypoxic 88% on room air in the ED -Urine strep pneumoniaand legionellaantigensnegative -Sputum culture: Few GPC, GVR, rare yeast -Blood cultures show no growth -was placed on vancomycin, Zosyn, Solu-Medrol, supplemental oxygen -S/p bronchoscopy 8/6, pending culture results -CXR obtained after bronch on 8/6: almost complete clearing of the hazy infiltrates in the right lung -Discussed with Dr. Tamala Julian (pulm), recommended prednisone taper starting with prednisone $RemoveBeforeD'40mg'jmAWReCdWkMMfR$  daily x 1 week, taper as an outpatient, along with Augmentin. He will follow up with the patient in one week. Given that infection cleared on xray so quickly, question if this is inflammatory vs infection.  -patient able to ambulate today without oxygen and maintained oxygen saturations in the mid 90s- therefore patient does not qualify for home oxygen -patient also had a swallow eval- mild aspiration risk  COPD -Continue nebulizer treatments, steroids, inhalers -Incentive spirometry, flutter valve  Alcohol/marijuana use -Discussed cessation -was placed on continue CIWA protocol  Essential hypertension -Continue Cozaar  Consultants PCCM- pulmonology   Procedures Bronchoscopy   Recommendations for Outpatient Follow-up:  1. Follow up with PCP in 1-2 weeks 2. Please obtain BMP/CBC in one week your next doctors visit.  3. Augmentin and azithromycin for 5 days 4. Advised to continue using bronchodilators every 4-6 hours for the next 4-5 days and then transition to as needed. 5. Advised to take his home prednisone, for next 3 days advised to increase it to 40 mg twice daily then resume back is 20 mg daily  Discharge Condition: Stable CODE  STATUS: Full code Diet recommendation: 2 g salt   DC summary from 09/11/18  Adm 9/7  D/C 09/13/18 Brief/Interim Summary: Mortimer Fries [name] a 59 y.o.malewith history ofCOPD on prednisone, HTN, alcohol use disorder, marijuana use,tobacco user. Patient presented secondary to  dyspnea and found to have evidence of pneumonia and a COPD exacerbation in addition to sepsis physiology on admission.  There is concerns of possible right lower lobe pneumonia therefore started on Unasyn and azithromycin due to concerns of aspiration.  This was transitioned to Augmentin and azithromycin for total of 5 days at time of discharge.  Secondary to infection, he was having COPD exacerbation therefore on bronchodilators.  He started improving and on the day of discharge he was adamant about going home as he was feeling much better.  He was still having abnormal breath sounds and cough therefore I had requested him to stay another day in the hospital but still wanted to go home.  Instructions were given to him take 40 mg of prednisone for next 3 days and then he can resume back his 20 mg daily.  Advised... home bronchodilators periodically every 4-6 hours for next 4-5 days and then transition to as needed. Advised to follow-up with his PCP in about 1 week.   Discharge Diagnoses:  Active Problems:   Hypertension   COPD exacerbation ([place])   Marijuana abuse   Pneumonia   Sepsis ([place])  Acute respiratory distress secondary to right lower lobe pneumonia, community-acquired/aspiration Acute mild exacerbation of COPD Concern for possible aspiration.  Unasyn and azithromycin transition to Augmentin and azithromycin.  We will give him 5 more day course at the time of discharge 40 mg of prednisone for next 3 days then transition to 20 mg daily which is his home dose Advised to use bronchodilators.3  Alcohol use disorder Counseled to quit drinking alcohol.  Advised to continue thiamine, folate and multivitamin  Weakness -He did well with physical therapy.  Tobacco use Marijuana use Cessation discussed on admission  Consultations:  None  Subjective: Still having some cough and congestion but he is adamant about going home today as he is feeling much better than from admission.  I  recommended he stay in the hospital for at least 1 more day to help him feel even better.  He wished to go home. Ambulating in the hallway without additional saturating greater than 90% on room air.   Since discharge this patient went to urgent care on 21 September with an outbreak of shingles over his left lower back.  He is taken a course of valacyclovir and prednisone.  He has severe pain.  He was given gabapentin but this caused hallucinations and then we tried tramadol but this caused severe nausea and vomiting.  Apparently the patient cannot take Tylenol or nonsteroidal anti-inflammatories as well.  The patient states his breathing is been labored.  He still has productive cough of white mucus.  He has no fever.  He has significant back pain because of the shingles. The patient is maintaining the [facility] and Incruse as well as his antihypertensive medications and vitamins     10/12/2018  This patient is seen in return follow-up for acute shingles with postherpetic neuropathy, recent pneumonia, COPD exacerbation, and hypertension.  Patient is markedly improved from the last visit.  He has less cough less shortness of breath.  He has improved pain around the area of the shingles.  He is taking amitriptyline 25 mg at bedtime  The patient notes less pain.  He does state he had been using the tramadol without nausea or vomiting.  I asked if patient would be interested in the para medicine program and expressed interest in the community para medicine program  11/23/2018 Patient seen in return follow-up and has noted increasing shortness of breath and we had to resume prednisone this patient.  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  He does maintain his inhalers.  He is drinking 4 beers a night.  He is not smoking.  Note the patient continues to live in a home that has standing water under the foundation and in several the rooms and mold on the carpet.  We have connected this patient with legal  aid to see if they can assist him in getting into better housing.  The patient's not had previous allergy testing   12/13/2018 The patient notes increased shortness of breath, wheezing and cough productive of thick yellow mucus.  He is using the flutter valve but is not being is productive with this.  He denies chest pain or fever.  He is down to 2 a day on the prednisone.  He is maintaining his inhalers. Apparently there is repair is being done on the patient's home and he is decided to stay in the home  02/27/2019 This patient is seen in return follow-up for COPD.  The patient has significant mold exposure in the home.  There has been some mitigation of this but is not complete.  The patient just was in the urgent care for COPD exacerbation a week ago treated with a course of antibiotics and prednisone.  He is now off prednisone. Patient states the pain in the back has resolved however he is still having increased shortness of breath.  He still having productive cough of thick yellow mucus.  05/01/2019  Since the last OV the patient has been admitted for Copd flare again. Below is the discharge summary Dc summary:  Admit Date: 04/23/2019 Discharge date: 04/27/2019  Recommendations for Outpatient Follow-up:  1. Follow up with PCP in 1-2 weeks 2. Please obtain CMP/CBC in one week 3. Please repeat chest x-ray in the next 6 to 8 weeks to document resolution of infiltrates, if not may require further work-up.     Brief Narrative: Patient is a48 y.o.malewith history of COPD, intellectual disability, HTN who presented with shortness of breath-found to have acute hypoxic respiratory failure secondary to pneumonia and COPD exacerbation. Patient initially required BiPAP on admission. See below for further details.  Significant events: 4/19>> admit to [facility] for severe hypoxemia requiring BiPAP 4/20>> liberated from BiPAP  Antimicrobial therapy: Rocephin 4/18>>4/23 Zithromax  4/18>>4/22  Microbiology data: Blood culture: 4/19>> negative  Procedures : None  Consults: None  Brief Hospital Course: Acute hypoxic/hypercarbic respiratory failure secondary to COPD exacerbation and pneumonia:Improved-initially required BiPAP on admission-titrated off BiPAP on 4/20.Now on room air with stable O2 saturations.  Feels much better-treated with Rocephin and Zithromax-we will transition to [facility] for a few more days on discharge.  He will resume his usual inhaler regimen-he will be placed on 20 mg of p.o. prednisone until he is seen by [name].  PCP to repeat a chest x-ray in 6 to 8 weeks to document resolution of infiltrates-if not he may require further work-up to rule out underlying malignancy.  (Per patient's sister-patient probably has been taking more prednisone than he should be-he is unable to read-and is not able to follow directions)  HTN: BPstable-continue losartan  EtOH abuse:No signs of withdrawal-managed with  Ativan  per protocol. Per patient's sister-he drinks around 2-3 bottles of beer on a daily basis.  Intellectual disability:Spoke at length with patient's sister ([name]) today-patient isa illiterate-and has been living on his own for all his life. His other sister lives a [place] and checks on him daily. Sister acknowledges poor living conditions-but apparently this is the way that patient has lived all his life-and chooses to live this way. 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. Patient is currently managing all his finances. [name] does acknowledge that patient will not let any home health services come to his house-he will not go to SNF. Difficult situation-but even though patient is an illiterate/intellectual disability-he understand that he is somewhat deconditioned from his acute illness-and is in need of some help-he does not want therapy services-he understands the risk of falls from being weak.  This MD had numerous  discussions with him regarding his appropriate disposition-all he wants to do is go home-his sister-[name] wants to have him come to her place in [place], [place] for a few weeks-however the patient really does not want to go there-and wants to go home.  I have discussed at length with social worker as well-at this point he is being discharged home at his own request.  His family will keep an eye on him.   Nutrition Problem: Nutrition Problem: Increased nutrient needs Etiology: chronic illness(COPD) Signs/Symptoms: estimated needs Interventions: Ensure Enlive (each supplement provides 350kcal and 20 grams of protein)  Discharge Diagnoses:  Principal Problem:   Acute on chronic respiratory failure with hypoxia and hypercapnia (HCC) Active Problems:   Hypertension   COPD exacerbation ([place])   Community acquired pneumonia of right lower lobe of lung   Marijuana abuse   Intellectual disability  Since discharge the patient has completed his antibiotics for his right lower lobe pneumonia.  He is on tapering prednisone.  He has developed a rash on his arms and face.  The rash is pruritic in nature.  He has been maintaining his medications as prescribed.   07/23/2019 Since the last visit this patient was readmitted to the hospital between 30 June and 3 July.  Below is a copy of the discharge summary Discharge Diagnosis: 1. Acute hypoxic respiratory 2/2 COPD exacerbation vs pneumonitis 2. GERD 3. EtOH history 4. HTN  Discharge Medications: Allergies as of 07/07/2019     Reactions  Gabapentin Other (See Comments)  hallucinations  Disposition and follow-up:   Mr.Treshon D Deady was discharged from [facility] in Stable condition.  At the hospital follow up visit please address:  1.  COPD exacerbation: Continue daily Albuterol, Dulera, Spiriva, and prednisone 40 mg daily. Sent home with 1 additional day of azithromycin. Discharged on 2L [place]. F/u allergy labs.    GERD: Severe reflux noted on esophagram, started on pantoprazole daily, may need GI follow up.   2.  Labs / imaging needed at time of follow-up: None  3.  Pending labs/ test needing follow-up: IgE, allergen panel for mold, hypersensitivity pneumonitis  Note since discharge the patient's breathing is back to baseline.  The patient does have some wheezing and minimal productive cough.  Noticed blood pressures ranged up at this visit.  He is maintaining Dulera Spiriva as before he has been maintained on prednisone 40 mg daily and also discharged on 2 L oxygen.  He no longer is actively working at this time.  Note his mold assay did show a mild hypersensitivity to some yeast.  08/23/2019 Patient returns today in  follow-up showing increased shortness of breath.  He continues to work outdoors in the heat and this is exacerbating his condition despite being on 20 mg of prednisone daily.  He notes thick yellow mucus that he is coughing up.  He notes increased wheeze.  He has ran out of his Spiriva        10/02/2019 This patient returns after having been in the hospital again for 4 days with discharge summary as noted below. Patient returns today with continued shortness of breath and productive cough. Mucus is still thick and yellow and difficult to raise. He has chest tightness. He still drinking 6-7 beers daily. He is no longer smoking. He is now on oxygen 4 L continuous. He has had his Medicaid approved and is now fully declared disabled. 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 The landlord has a complex relationship with this patient. Patient lives rent free and a home on the landlord's property. The home is not in good repair as documented previously.   Date of Admission: 09/22/2019  3:42 AM Date of Discharge: 09/25/2019 Attending Physician: Lenice Pressman, MD PhD  Discharge Diagnosis: 1. COPD exacerbation, underlying bronchiectasis 2. Alcohol use  disorder 3. Skin rash    Disposition and follow-up:   Mr.Jaqwan D Stainback was discharged from [facility] in Stable condition.  At the hospital follow up visit please address:  1.  Follow up      A. COPD exacerbation, recurrent pneumonias, underlying bronchiectasis - hx of recurrent pneumonia, see hospital course for details on prior workup, follow up with his pulmonologist      B. Alcohol use disorder - had mild withdrawal symptoms, motivated to quit, consider pharmacologic assistance such as naltrexone      C. Skin rash - not improving with triamcinolone, referred to dermatology  2.  Labs / imaging needed at time of follow-up: skin biopsy  3.  Pending labs/ test needing follow-up: none  Follow-up Appointments:  Hospital Course by problem list:  COPDexacerbation Recurrent pneumonia Bronchiectasis on bronchoscopy Presents with increased SOB, but cough baseline and not productive. He has had similar symptoms many times previously. PFTs in April 2021 with FEV1/FVC which was 54.01% of expected. CXR this admission with vague R lower lung field opacity, same location as opacity on prior CXRs. Has had recurrent pneumonia as far back as 2015. bronchoscopy 08/10/2018 for recurrent PNU demonstrating severe chronic bronchitis and bronchiectasis,neutrophil predominance and transbronchial biopsies with benign lung tissue with inflammation. CT on 07/05/19 with no ILD, no centrally obstructing lesion. Esophagram on 07/06/19 with no stenosis but severe reflux which could be contributing to COPD. Other pulm workup significant for IgE WNL, allergen profile for mold equivocal for Candida albicans but negative for all others, negative hypersensitivity pneumonitis panel. Has very good outpatient follow up with pulmonology. Is on Dulera, Spiriva, and albuterol and also prednisone $RemoveBeforeD'40mg'JvvqoYJJJpfkFh$  daily, states he has been compliant with all these, though at one point earlier this admission denied steroid  use. Treated with ceftriaxone, azithromycin, and prednisone initially with rapid improvement in SOB. No fevers this admission, cough remains unproductive so did not culture. Discharged on his home Dulera, Spiriva, albuterol, Prednisone. Discharged with oral azithromycin to complete 5 day course.   Alcohol Use Disorder Reports using 2x 24oz beers per day, which is decreased from prior. No hx of withdrawal seizures. CIWA scores max of 15 indicating mild withdrawal, requiring relatively low amounts of Ativan. On  day of discharge had scores of up to 5. Patient is motivated to quit drinking because "I want to live longer." Discharged with PO thiamine.  Skin rash Reports a rash for the past several months on his abdomen and both arms. His PCP has been treating this with triamcinolone, but this has not really helped. Referral provided to dermatology for biopsy/further assessment.  Note the patient's rash on both forearms is still persistent and triamcinolone has not helped  This patient will need a dermatology referral as per discharge summary. Note the patient is still actively drinking alcohol. See shortness of breath assessment below  Shortness of Breath This is a chronic problem. The current episode started more than 1 year ago. The problem occurs daily (qhs shortness of breath). The problem has been gradually worsening. Associated symptoms include chest pain, PND, sputum production and wheezing. Pertinent negatives include no claudication, fever, headaches, hemoptysis, leg pain, leg swelling, orthopnea, rash, sore throat, syncope or vomiting. The symptoms are aggravated by any activity, weather changes, lying flat and occupational exposure. Associated symptoms comments: No dysphagia No gerd . Risk factors include smoking. He has tried beta agonist inhalers, oral steroids and steroid inhalers for the symptoms. The treatment provided moderate relief. His past medical history is significant for COPD  and pneumonia. There is no history of asthma, CAD, DVT, a heart failure or PE.    Past Medical History:  Diagnosis Date  . Acute on chronic respiratory failure with hypoxia ([place]) 06/08/2013  . CAP (community acquired pneumonia) 05/17/2018   See admit 05/17/18 ? rml  with   covid pcr neg - rx augmentin > f/u cxr in 4-6 weeks is fine unless condition declines   . Community acquired pneumonia of right lower lobe of lung 05/17/2018   See admit 05/17/18 ? rml  with   covid pcr neg - rx augmentin > f/u cxr in 4-6 weeks is fine unless condition declines   . COPD (chronic obstructive pulmonary disease) ([place])    not on home  . Diabetes ([place])   . Dyspnea   . Hypertension   . Pneumonia 04/06/2016  . Pneumothorax    2016, fell from ladder  . 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 Tick bite 06/03/2018     Family History  Problem Relation Age of Onset  . Heart disease Father   . COPD Sister      Social History   Socioeconomic History  . Marital status: Divorced    Spouse name: Not on file  . Number of children: Not on file  . Years of education: Not on file  . Highest education level: Not on file  Occupational History  . Occupation: Dealer  . Occupation: Painter  Tobacco Use  . Smoking status: Former Smoker    Packs/day: 1.50    Years: 40.00    Pack years: 60.00    Types: Cigarettes    Quit date: 2014    Years since quitting: 7.7  . Smokeless tobacco: Current User    Types: Chew  Vaping Use  . Vaping Use: Never used  Substance and Sexual Activity  . Alcohol use: Yes    Alcohol/week: 28.0 standard drinks    Types: 28 Cans of beer per week    Comment: 4 beers a night; + jitters with not drinking, denies DTs or seizures  . Drug use: Yes    Types: Marijuana    Comment: daily; quit using crack and methamphetamine about 5 months ago   . Sexual activity:  Not Currently    Partners: Female  Other Topics Concern  . Not on file  Social History Narrative   Lives alone near  [place] Determinants of Health   Financial Resource Strain:   . Difficulty of Paying Living Expenses: Not on file  Food Insecurity:   . Worried About Charity fundraiser in the Last Year: Not on file  . Ran Out of Food in the Last Year: Not on file  Transportation Needs:   . Lack of Transportation (Medical): Not on file  . Lack of Transportation (Non-Medical): Not on file  Physical Activity:   . Days of Exercise per Week: Not on file  . Minutes of Exercise per Session: Not on file  Stress:   . Feeling of Stress : Not on file  Social Connections:   . Frequency of Communication with Friends and Family: Not on file  . Frequency of Social Gatherings with Friends and Family: Not on file  . Attends Religious Services: Not on file  . Active Member of Clubs or Organizations: Not on file  . Attends Archivist Meetings: Not on file  . Marital Status: Not on file  Intimate Partner Violence:   . Fear of Current or Ex-Partner: Not on file  . Emotionally Abused: Not on file  . Physically Abused: Not on file  . Sexually Abused: Not on file     Allergies  Allergen Reactions  . Gabapentin Other (See Comments)    hallucinations     Outpatient Medications Prior to Visit  Medication Sig Dispense Refill  . Accu-Chek Softclix Lancets lancets Use as instructed to check blood sugar once daily. 100 each 2  . albuterol (PROAIR HFA) 108 (90 Base) MCG/ACT inhaler Inhale 2 puffs into the lungs every 6 (six) hours as needed for wheezing or shortness of breath. 8.5 g 2  . Blood Glucose Monitoring Suppl (TRUE METRIX METER) w/Device KIT Use to measure blood sugar twice a day 1 kit 0  . glucose blood (TRUE METRIX BLOOD GLUCOSE TEST) test strip Use as instructed 100 each 12  . metoprolol succinate (TOPROL-XL) 25 MG 24 hr tablet Take 1 tablet (25 mg total) by mouth daily. 90 tablet 3  . Respiratory Therapy Supplies (FLUTTER) DEVI Use 4 times daily 1 each 0  . Tiotropium Bromide  Monohydrate (SPIRIVA RESPIMAT) 2.5 MCG/ACT AERS Two puff daily 4 g 11  . albuterol (PROVENTIL) (2.5 MG/3ML) 0.083% nebulizer solution Take 3 mLs (2.5 mg total) by nebulization every 6 (six) hours as needed for wheezing or shortness of breath. 539 mL 0  . folic acid (FOLVITE) 1 MG tablet Take 1 tablet (1 mg total) by mouth daily. 90 tablet 0  . losartan (COZAAR) 100 MG tablet Take 1 tablet (100 mg total) by mouth daily. 30 tablet 2  . metFORMIN (GLUCOPHAGE) 500 MG tablet Take 1 tablet (500 mg total) by mouth 2 (two) times daily with a meal. 120 tablet 01  . mometasone-formoterol (DULERA) 200-5 MCG/ACT AERO Inhale 2 puffs into the lungs 2 (two) times daily. 13 g 0  . predniSONE (DELTASONE) 20 MG tablet Take 1 tablet (20 mg total) by mouth daily with breakfast. Take 4 tablets daily for 5 days then 3 tablets for 5 days then two tablets a day and stay (Patient taking differently: Take 20 mg by mouth daily with breakfast. ) 60 tablet 1  . Blood Glucose Monitoring Suppl (ACCU-CHEK GUIDE ME) w/Device KIT USE TO MEASURE BLOOD SUGAR TWICE A  DAY    . pantoprazole (PROTONIX) 40 MG tablet Take 1 tablet (40 mg total) by mouth daily. (Patient not taking: Reported on 09/23/2019) 30 tablet 6  . thiamine (VITAMIN B-1) 100 MG tablet Take 1 tablet (100 mg total) by mouth daily. (Patient not taking: Reported on 08/29/2019) 30 tablet 3  . triamcinolone cream (KENALOG) 0.1 % Apply 1 application topically 2 (two) times daily. (Patient not taking: Reported on 09/23/2019) 30 g 0   No facility-administered medications prior to visit.     Review of Systems  Constitutional: Negative for fever.  HENT: Negative for sinus pressure, sinus pain, sore throat and trouble swallowing.   Eyes: Negative.   Respiratory: Positive for cough, sputum production, chest tightness, shortness of breath and wheezing. Negative for hemoptysis.   Cardiovascular: Positive for chest pain and PND. Negative for palpitations, orthopnea, claudication, leg  swelling and syncope.  Gastrointestinal: Negative for nausea and vomiting.  Genitourinary: Negative.   Musculoskeletal: Positive for back pain.  Skin: Negative for rash.  Neurological: Negative for headaches.  Psychiatric/Behavioral: Negative for hallucinations. The patient is not nervous/anxious and is not hyperactive.        Objective:   Physical Exam Vitals:   10/02/19 0838  BP: (!) 144/77  Pulse: (!) 110  Temp: 98.6 F (37 C)  TempSrc: Oral  SpO2: 94%  Weight: 191 lb 6.4 oz (86.8 kg)  Height: $Remove'5\' 9"'MMmtMDN$  (1.753 m)    Gen: , well-nourished, in no distress,   ENT: No lesions,  mouth clear,  oropharynx clear, no postnasal drip  Neck: No JVD, no TMG, no carotid bruits  Lungs: No use of accessory muscles, no dullness to percussion, inspiratory and expiratory wheeze with poor airflow  Cardiovascular: RRR, heart sounds normal, no murmur or gallops, no peripheral edema  Abdomen: soft and NT, no HSM,  BS normal  Musculoskeletal: No deformities, no cyanosis or clubbing  Neuro: alert, non focal  Skin: Warm, rash over arms and face  Disability eval at [facility]:   Pulmonary Function Test (04/19/2019 1:42 PM EDT) Pulmonary Function Test (04/19/2019 1:42 PM EDT)  Component Value Ref Range Performed At Pathologist Signature  FVC Post 2.51 L [facility]   FEV1 Post 1.55 L [facility]   FEV1/FVC Post 61.87 % [facility]   FEF25-75% Post 0.90 L/s [facility]   PEF Post 3.42 L/s [facility]   MVV Post 42.00 L/min [facility]   FVC Pre 2.92 L [facility]   FEV1 Pre 1.57 L [facility]   FEV1/FVC Pre 54.01 % [facility]   FEF25-75% Pre 0.68 L/s [facility]   PEF Pre 4.14 L/s [facility]   MVV Pre 45.00 L/min [facility]   FVC_LLN 3.73  [facility]    FVC_Z-SCORE -2.84  [facility]   FVC_%PRED 60 % % [facility]   FVC_Z-SCORE -2.84  [facility]   FVC_%PRED 52 % % [facility]   FVC %Chng -14 % % [facility]   FEV1_LLN 2.86  [facility]   FEV1_Z-SCORE -3.78  [facility]   FEV1_%PRED 42 % % [facility]   FEV1_Z-SCORE -3.78  [facility]   FEV1_%PRED 41 % % [facility]   FEV1 %Chng -2 % %  [facility]   FEV1/FVC_LLN 12  [facility]   FEV1/FVC %Chng 15 % % [facility]   FEF25-75%_LLN 1.60  [facility]   FEF25-75%_Z-SCORE -3.05  [facility]   FEF25-75%_%PRED 21 % % [facility]   FEF25-75%_Z-SCORE -3.05  [facility]   FEF25-75%_%PRED 28 % % [facility]   FEF25-75% %Chng 33 % % [facility]   PEF_LLN 7.25  [facility]   PEF_%PRED 43 % 36 % % [facility]   PEF %Chng -17 % % [facility]   MVV_LLN 130  [facility]   MVV_%PRED 35 % 32 % % [facility]   MVV %Chng -7 % % [facility]     Media Information    Document Information  Photos    11/02/2018 14:43  Attached To:  [name]  Source Information  [name], MD  [facility]   Pertinent Labs, Studies, and Procedures:  DG Chest Portable 1 View  Result Date: 09/22/2019 CLINICAL DATA:  Dyspnea. COPD exacerbation. Chest pain and shortness of breath. EXAM: PORTABLE CHEST 1 VIEW COMPARISON:  Cyst 07/05/2019 FINDINGS: Stable cardiomediastinal contours. Chronic blunting of the left costophrenic angle due to pleuroparenchymal scarring. Changes of emphysema noted with asymmetric increase hazy opacity within the  right lung base concerning for pneumonia. Numerous remote bilateral rib fracture deformities. IMPRESSION: 1. Increased hazy opacity within the right lung base concerning for pneumonia. 2. Emphysema. Electronically Signed   By: Kerby Moors M.D.   On: 09/22/2019 04:20     BMP Latest Ref Rng & Units 09/25/2019 09/24/2019 09/23/2019  Glucose 70 - 99 mg/dL 160(H) 112(H) 155(H)  BUN 6 - 20 mg/dL $Remove'17 16 13  'gAFNMil$ Creatinine 0.61 - 1.24 mg/dL 0.79 0.64 0.71  BUN/Creat Ratio 9 - 20 - - -  Sodium 135 - 145 mmol/L 139 141 141  Potassium 3.5 - 5.1 mmol/L 4.0 4.5 4.4  Chloride 98 - 111 mmol/L 97(L) 101 104  CO2 22 - 32 mmol/L 30 32 29  Calcium 8.9 - 10.3 mg/dL 9.2 9.2 9.2   Hepatic Function Panel     Component Value Date/Time   PROT 5.7 (L) 09/23/2019 0758   PROT 5.9 (L) 07/23/2019 0900   ALBUMIN 3.1 (L) 09/23/2019 0758   ALBUMIN 4.0 07/23/2019 0900   AST 16 09/23/2019 0758   ALT 20 09/23/2019 0758   ALT 62 (H) 06/23/2016 1226   ALKPHOS 39 09/23/2019 0758   BILITOT 0.7 09/23/2019 0758   BILITOT <0.2 07/23/2019 0900   BILIDIR 0.1 06/20/2019 0619   BILIDIR 0.12 04/16/2016 1601   IBILI 0.6 06/20/2019 0619   CBC Latest Ref Rng & Units 09/25/2019 09/24/2019 09/23/2019  WBC 4.0 - 10.5 K/uL 12.2(H) 11.2(H) 14.9(H)  Hemoglobin 13.0 - 17.0 g/dL 13.6 12.9(L) 13.8  Hematocrit 39 - 52 % 42.8 42.1 44.4  Platelets 150 - 400 K/uL 267 244 282       Assessment & Plan:  I personally reviewed all images and lab data in the [facility] system as well as any outside material available during this office visit and agree with the  radiology impressions.   No problem-specific Assessment & Plan notes found for this encounter.   Rahm was seen today for hospitalization follow-up.  Diagnoses and all orders for this visit:  Type 2 diabetes mellitus without  complication, without long-term current use of insulin (HCC) -     Glucose (CBG)  Essential hypertension -     losartan (COZAAR) 100 MG tablet; Take 1 tablet (100 mg  total) by mouth daily.  Other orders -     moxifloxacin (AVELOX) 400 MG tablet; Take 1 tablet (400 mg total) by mouth daily for 5 days. -     folic acid (FOLVITE) 1 MG tablet; Take 1 tablet (1 mg total) by mouth daily. -     thiamine (VITAMIN B-1) 100 MG tablet; Take 1 tablet (100 mg total) by mouth daily. -     mometasone-formoterol (DULERA) 200-5 MCG/ACT AERO; Inhale 2 puffs into the lungs 2 (two) times daily. -     albuterol (PROVENTIL) (2.5 MG/3ML) 0.083% nebulizer solution; Take 3 mLs (2.5 mg total) by nebulization every 6 (six) hours as needed for wheezing or shortness of breath. -     predniSONE (DELTASONE) 20 MG tablet; Take 1 tablet (20 mg total) by mouth daily with breakfast. -     pantoprazole (PROTONIX) 40 MG tablet; Take 1 tablet (40 mg total) by mouth daily. -     metFORMIN (GLUCOPHAGE) 500 MG tablet; Take 1 tablet (500 mg total) by mouth 2 (two) times daily with a meal.  Return in 2 weeks

## 2019-10-02 NOTE — Assessment & Plan Note (Signed)
Type 2 diabetes exacerbated by prednisone use continue Metformin

## 2019-10-02 NOTE — Assessment & Plan Note (Signed)
Atopic dermatitis not responsive to steroids will refer to dermatology

## 2019-10-02 NOTE — Patient Instructions (Addendum)
Take avelox one daily for 5 days  Stay on prednisone 20mg  one  tablet daily  Refills on all medications sent  Stay on oxygen:   2  L rest and  4    L exertion  No working , you are disabled  Please reconsider alternate housing  Return [name] 2 weeks   Stop drinking alcohol  Take the folic acid and thiamine daily

## 2019-10-02 NOTE — Assessment & Plan Note (Signed)
Recurrent COPD exacerbation with ongoing chronic respiratory failure with hypoxemia  We will give Avelox 400 mg daily for a 5-day course we will maintain prednisone 20 mg daily and hold

## 2019-10-02 NOTE — Assessment & Plan Note (Signed)
Amatory walk test revealed desaturation with exertion to 85% on 3 L however on 4 L saturation maintained 96 to 85% At rest ambulatory saturations were satisfactory at 2 L 97%  Patient instructed to use oxygen 2 L rest 4 L exertion and he has a concentrator that is portable

## 2019-10-02 NOTE — Telephone Encounter (Signed)
Met with the patient and his sister, [name] , when he was in the clinic today. Discussed the negative impact that his current housing and job is having on his health. He said that he understands but was adamant that he did not want to live anywhere else. He did not want to have neighbors close by. He said that he also understands that working is not good for him and he will need to stop working.   He currently receives disability - about $500/month.  Per [name], the initial disability payment was reduced because the landlord, [name], informed [employer] that he ( [name]) pays the utility bill and buys food for the patient.   The patient was agreeable to checking some of the housing options available on [email or web address]  If he could drive by some of the residences he may feel more comfortable considering moving. The list of available properties up to $450/month was shared with the patient and his sister.

## 2019-10-03 ENCOUNTER — Telehealth: Payer: Self-pay | Admitting: Critical Care Medicine

## 2019-10-03 NOTE — Telephone Encounter (Signed)
Copied from [place] [id number]. Topic: General - Other >> Oct 03, 2019 11:17 AM Antonieta Iba C wrote: Reason for CRM: pt's sister [name] called in to make provider aware that pt didn't receive his fluid pill at the pharmacy. Sister says that pt was told that by provider that he was going to send in    pharmacy: [facility], [address]  [place], [place] [address]  Phone:  [phone] Fax:  [phone]    Please assist pt.

## 2019-10-03 NOTE — Telephone Encounter (Signed)
I do not see a fluid pill on the patient's profile. Will forward information to [name] to see if he can assist.

## 2019-10-04 NOTE — Telephone Encounter (Signed)
James [name] called and stated that the Pt is on a permanent dose on prednisone that has cause fluid swelling in his body and [name] was going to get him a fluid pill to get some of the swelling down / James [name] called to see if the medication was sent to the pharmacy / please advise

## 2019-10-04 NOTE — Telephone Encounter (Signed)
A diuretic is not indicated in this patient.

## 2019-10-05 ENCOUNTER — Other Ambulatory Visit: Payer: Self-pay

## 2019-10-05 ENCOUNTER — Other Ambulatory Visit: Payer: Self-pay | Admitting: Critical Care Medicine

## 2019-10-05 ENCOUNTER — Other Ambulatory Visit: Payer: Self-pay | Admitting: *Deleted

## 2019-10-05 MED ORDER — FUROSEMIDE 20 MG PO TABS: 20.0000 mg | ORAL_TABLET | Freq: Every day | ORAL | 3 refills | Status: DC | PRN

## 2019-10-05 MED FILL — FUROSEMIDE 20 MG TABS: 20 | 30 days supply | Qty: 30 | Fill #0

## 2019-10-05 NOTE — Addendum Note (Signed)
Addended by: [name] on: 10/05/2019 07:23 AM   Modules accepted: Orders

## 2019-10-05 NOTE — Patient Instructions (Signed)
Visit Information  James [name] was given information about Medicaid Managed Care team care coordination services as a part of their Healthy [facility] Medicaid benefit. [name] verbally consented to engagement with the [facility] Managed Care team.   For questions related to your Healthy [facility] health plan, please call: [phone] or visit the homepage here: [email or web address]  If you would like to schedule transportation through your Healthy [facility] plan, please call the following number at least 2 days in advance of your appointment: [phone]  Goals Addressed            This Visit's Progress    Patient sister stated "He needs a water pill, his whole body is swelling"       [name] (see longitudinal plan of care for additional care plan information)  Current Barriers:   Knowledge Deficits related to medications for disease management. Patient's sister concerned about patients swelling and requested a "water pill" from PCP  Nurse Case Manager Clinical Goal(s):   Over the next 7 days, patient will verbalize understanding of plan for prescribed medications  Over the next 30 days, patient will meet with RN Care Manager to address medication needs  Over the next 30 days, patient will attend all scheduled medical appointments: 10/23/19 with [name]  Interventions:   Inter-disciplinary care team collaboration (see longitudinal plan of care)  Evaluation of current treatment plan related to COPD and patient's adherence to plan as established by provider.  Advised patient to pick up Lasix and take as prescribed by [name]  Provided education to patient re: COPD  Reviewed medications with patient and discussed new prescription for Lasix noted in EPIC  Plan:   Patient's sister will pick up prescribed Lasix today   RNCM will follow up with patient and sister within 7 days   Initial goal  documentation      Patient's sister states "He is not checking his blood sugars, because he doesn't have a monitor"       [name] (see longitudinal plan of care for additional care plan information)  Current Barriers:   Chronic Disease Management support and education needs related to Diabetes-Unable to monitor blood sugars due to not having appropriate medical equipment. Patient's sister unaware blood glucose monitor and supplies prescribed  Nurse Case Manager Clinical Goal(s):   Over the next 7 days, patient will meet with [name] to address obtaining glucose monitor  Over the next 30 days, patient will attend all scheduled medical appointments: 10/23/19 with [name]  Over the next 30 days, patient will demonstrate improved adherence to prescribed treatment plan for Diabetes as evidenced by monitoring blood sugars as prescribed  Interventions:   Inter-disciplinary care team collaboration (see longitudinal plan of care)  Evaluation of current treatment plan related to Diabetes and patient's adherence to plan as established by provider.  Provided education to patient re: Diabetes  Reviewed medications with patient's sister and discussed taking metformin and monitoring blood sugar  Collaborated with pharmacy regarding glucose monitor-Contacted pharmacy, glucose monitor has been picked up on 08/16/19. [name] notified of this and will look for monitor at her brother's home   Discussed plans with patient's sister for ongoing care management follow up and provided patient's sister with direct contact information for care management team  Plan:   Patient will attend follow up appointment with [name]  [name] will follow up with telephone call within 7 days   Initial  goal documentation        Please see education materials related to COPD and Diabetes provided by MyChart link.   Chronic Obstructive Pulmonary Disease Chronic  obstructive pulmonary disease (COPD) is a long-term (chronic) lung problem. When you have COPD, it is hard for air to get in and out of your lungs. Usually the condition gets worse over time, and your lungs will never return to normal. There are things you can do to keep yourself as healthy as possible.  Your doctor may treat your condition with: ? Medicines. ? Oxygen. ? Lung surgery.  Your doctor may also recommend: ? Rehabilitation. This includes steps to make your body work better. It may involve a team of specialists. ? Quitting smoking, if you smoke. ? Exercise and changes to your diet. ? Comfort measures (palliative care). Follow these instructions at home: Medicines  Take over-the-counter and prescription medicines only as told by your doctor.  Talk to your doctor before taking any cough or allergy medicines. You may need to avoid medicines that cause your lungs to be dry. Lifestyle  If you smoke, stop. Smoking makes the problem worse. If you need help quitting, ask your doctor.  Avoid being around things that make your breathing worse. This may include smoke, chemicals, and fumes.  Stay active, but remember to rest as well.  Learn and use tips on how to relax.  Make sure you get enough sleep. Most adults need at least 7 hours of sleep every night.  Eat healthy foods. Eat smaller meals more often. Rest before meals. Controlled breathing Learn and use tips on how to control your breathing as told by your doctor. Try:  Breathing in (inhaling) through your nose for 1 second. Then, pucker your lips and breath out (exhale) through your lips for 2 seconds.  Putting one hand on your belly (abdomen). Breathe in slowly through your nose for 1 second. Your hand on your belly should move out. Pucker your lips and breathe out slowly through your lips. Your hand on your belly should move in as you breathe out.  Controlled coughing Learn and use controlled coughing to clear mucus from  your lungs. Follow these steps: 1. Lean your head a little forward. 2. Breathe in deeply. 3. Try to hold your breath for 3 seconds. 4. Keep your mouth slightly open while coughing 2 times. 5. Spit any mucus out into a tissue. 6. Rest and do the steps again 1 or 2 times as needed. General instructions  Make sure you get all the shots (vaccines) that your doctor recommends. Ask your doctor about a flu shot and a pneumonia shot.  Use oxygen therapy and pulmonary rehabilitation if told by your doctor. If you need home oxygen therapy, ask your doctor if you should buy a tool to measure your oxygen level (oximeter).  Make a COPD action plan with your doctor. This helps you to know what to do if you feel worse than usual.  Manage any other conditions you have as told by your doctor.  Avoid going outside when it is very hot, cold, or humid.  Avoid people who have a sickness you can catch (contagious).  Keep all follow-up visits as told by your doctor. This is important. Contact a doctor if:  You cough up more mucus than usual.  There is a change in the color or thickness of the mucus.  It is harder to breathe than usual.  Your breathing is faster than usual.  You have  trouble sleeping.  You need to use your medicines more often than usual.  You have trouble doing your normal activities such as getting dressed or walking around the house. Get help right away if:  You have shortness of breath while resting.  You have shortness of breath that stops you from: ? Being able to talk. ? Doing normal activities.  Your chest hurts for longer than 5 minutes.  Your skin color is more blue than usual.  Your pulse oximeter shows that you have low oxygen for longer than 5 minutes.  You have a fever.  You feel too tired to breathe normally. Summary  Chronic obstructive pulmonary disease (COPD) is a long-term lung problem.  The way your lungs work will never return to normal. Usually  the condition gets worse over time. There are things you can do to keep yourself as healthy as possible.  Take over-the-counter and prescription medicines only as told by your doctor.  If you smoke, stop. Smoking makes the problem worse. This information is not intended to replace advice given to you by your health care provider. Make sure you discuss any questions you have with your health care provider. Document Revised: 12/03/2016 Document Reviewed: 01/26/2016 Elsevier Patient Education  [place].  Type 2 Diabetes Mellitus, Self Care, Adult When you have type 2 diabetes (type 2 diabetes mellitus), you must make sure your blood sugar (glucose) stays in a healthy range. You can do this with:  Nutrition.  Exercise.  Lifestyle changes.  Medicines or insulin, if needed.  Support from your doctors and others. How to stay aware of blood sugar   Check your blood sugar level every day, as often as told.  Have your A1c (hemoglobin A1c) level checked two or more times a year. Have it checked more often if your doctor tells you to. Your doctor will set personal treatment goals for you. Generally, you should have these blood sugar levels:  Before meals (preprandial): 80-130 mg/dL (4.4-7.2 mmol/L).  After meals (postprandial): below 180 mg/dL (10 mmol/L).  A1c level: less than 7%. How to manage high and low blood sugar Signs of high blood sugar High blood sugar is called hyperglycemia. Know the signs of high blood sugar. Signs may include:  Feeling: ? Thirsty. ? Hungry. ? Very tired.  Needing to pee (urinate) more than usual.  Blurry vision. Signs of low blood sugar Low blood sugar is called hypoglycemia. This is when blood sugar is at or below 70 mg/dL (3.9 mmol/L). Signs may include:  Feeling: ? Hungry. ? Worried or nervous (anxious). ? Sweaty and clammy. ? Confused. ? Dizzy. ? Sleepy. ? Sick to your stomach (nauseous).  Having: ? A fast heartbeat. ? A  headache. ? A change in your vision. ? Jerky movements that you cannot control (seizure). ? Tingling or no feeling (numbness) around your mouth, lips, or tongue.  Having trouble with: ? Moving (coordination). ? Sleeping. ? Passing out (fainting). ? Getting upset easily (irritability). Treating low blood sugar To treat low blood sugar, eat or drink something sugary right away. If you can think clearly and swallow safely, follow the 15:15 rule:  Take 15 grams of a fast-acting carb (carbohydrate). Talk with your doctor about how much you should take.  Some fast-acting carbs are: ? Sugar tablets (glucose pills). Take 3-4 pills. ? 6-8 pieces of hard candy. ? 4-6 oz (120-150 mL) of fruit juice. ? 4-6 oz (120-150 mL) of regular (not diet) soda. ? 1 Tbsp (15  mL) honey or sugar.  Check your blood sugar 15 minutes after you take the carb.  If your blood sugar is still at or below 70 mg/dL (3.9 mmol/L), take 15 grams of a carb again.  If your blood sugar does not go above 70 mg/dL (3.9 mmol/L) after 3 tries, get help right away.  After your blood sugar goes back to normal, eat a meal or a snack within 1 hour. Treating very low blood sugar If your blood sugar is at or below 54 mg/dL (3 mmol/L), you have very low blood sugar (severe hypoglycemia). This is an emergency. Do not wait to see if the symptoms will go away. Get medical help right away. Call your local emergency services (911 in the [place] If you have very low blood sugar and you cannot eat or drink, you may need a glucagon shot (injection). A family member or friend should learn how to check your blood sugar and how to give you a glucagon shot. Ask your doctor if you need to have a glucagon shot kit at home. Follow these instructions at home: Medicine  Take insulin and diabetes medicines as told.  If your doctor says you should take more or less insulin and medicines, do this exactly as told.  Do not run out of insulin or  medicines. Having diabetes can raise your risk for other long-term conditions. These include heart disease and kidney disease. Your doctor may prescribe medicines to help you not have these problems. Food   Make healthy food choices. These include: ? Chicken, fish, egg whites, and beans. ? Oats, whole wheat, bulgur, brown rice, quinoa, and millet. ? Fresh fruits and vegetables. ? Low-fat dairy products. ? Nuts, avocado, olive oil, and canola oil.  Meet with a food specialist (dietitian). He or she can help you make an eating plan that is right for you.  Follow instructions from your doctor about what you cannot eat or drink.  Drink enough fluid to keep your pee (urine) pale yellow.  Keep track of carbs that you eat. Do this by reading food labels and learning food serving sizes.  Follow your sick day plan when you cannot eat or drink normally. Make this plan with your doctor so it is ready to use. Activity  Exercise 3 or more times a week.  Do not go more than 2 days without exercising.  Talk with your doctor before you start a new exercise. Your doctor may need to tell you to change: ? How much insulin or medicines you take. ? How much food you eat. Lifestyle  Do not use any tobacco products. These include cigarettes, chewing tobacco, and e-cigarettes. If you need help quitting, ask your doctor.  Ask your doctor how much alcohol is safe for you.  Learn to deal with stress. If you need help with this, ask your doctor. Body care   Stay up to date with your shots (immunizations).  Have your eyes and feet checked by a doctor as often as told.  Check your skin and feet every day. Check for cuts, bruises, redness, blisters, or sores.  Brush your teeth and gums two times a day. Floss one or more times a day.  Go to the dentist one or more times every 6 months.  Stay at a healthy weight. General instructions  Take over-the-counter and prescription medicines only as told  by your doctor.  Share your diabetes care plan with: ? Your work or school. ? People you live with.  Carry a card or wear jewelry that says you have diabetes.  Keep all follow-up visits as told by your doctor. This is important. Questions to ask your doctor  Do I need to meet with a diabetes educator?  Where can I find a support group for people with diabetes? Where to find more information To learn more about diabetes, visit:  American Diabetes Association: www.diabetes.org  American Association of Diabetes Educators: www.diabeteseducator.org Summary  When you have type 2 diabetes, you must make sure your blood sugar (glucose) stays in a healthy range.  Check your blood sugar every day, as often as told.  Having diabetes can raise your risk for other conditions. Your doctor may prescribe medicines to help you not have these problems.  Keep all follow-up visits as told by your doctor. This is important. This information is not intended to replace advice given to you by your health care provider. Make sure you discuss any questions you have with your health care provider. Document Revised: 06/13/2017 Document Reviewed: 01/24/2015 Elsevier Patient Education  2020 [employer].   Patient verbalizes understanding of instructions provided today.   The Managed Medicaid care management team will reach out to the patient again over the next 7 days.   [name] RN, BSN [place] RN Care Coordinator

## 2019-10-05 NOTE — Telephone Encounter (Signed)
Let sister of pt know I will give a low dose of diuretic, for lower extremity swelling as needed,  Let her know the facial swelling is from the steroids and will not be changed or affected by any fluid pill

## 2019-10-05 NOTE — Patient Outreach (Signed)
Care Coordination - Case Manager  10/05/2019  James [name] [birth year] [id number]  Subjective:  James [name] is an 59 y.o. year old male who is a primary patient of James Gaskins Burnett Harry, MD.  James [name] sister, James [name]) was given information about Medicaid Managed Care team care coordination services today. On behalf of James [name] agreed to services and verbal consent obtained  Review of patient status, laboratory and other test data was performed as part of evaluation for provision of services.  SDOH: SDOH Screenings   Alcohol Screen:   . Last Alcohol Screening Score (AUDIT): Not on file  Depression (PHQ2-9): Low Risk   . PHQ-2 Score: 2  Financial Resource Strain:   . Difficulty of Paying Living Expenses: Not on file  Food Insecurity:   . Worried About Charity fundraiser in the Last Year: Not on file  . Ran Out of Food in the Last Year: Not on file  Housing:   . Last Housing Risk Score: Not on file  Physical Activity:   . Days of Exercise per Week: Not on file  . Minutes of Exercise per Session: Not on file  Social Connections:   . Frequency of Communication with Friends and Family: Not on file  . Frequency of Social Gatherings with Friends and Family: Not on file  . Attends Religious Services: Not on file  . Active Member of Clubs or Organizations: Not on file  . Attends Archivist Meetings: Not on file  . Marital Status: Not on file  Stress:   . Feeling of Stress : Not on file  Tobacco Use: High Risk  . Smoking Tobacco Use: Former Smoker  . Smokeless Tobacco Use: Current User  Transportation Needs:   . Lack of Transportation (Medical): Not on file  . Lack of Transportation (Non-Medical): Not on file     Objective:    Allergies  Allergen Reactions  . Gabapentin Other (See Comments)    hallucinations    Medications:    Medications Reviewed Today    Reviewed by [name], MD (Physician) on 10/02/19 at  [place] List Status: <None>  Medication Order Taking? Sig Documenting Provider Last Dose Status Informant  Accu-Chek Softclix Lancets lancets [id number] Yes Use as instructed to check blood sugar once daily. [name], MD Taking Active Self  albuterol [facility] HFA) [phone] Base) MCG/ACT inhaler [id number] Yes Inhale 2 puffs into the lungs every 6 (six) hours as needed for wheezing or shortness of breath. [name], MD Taking Active Self  albuterol (PROVENTIL) (2.5 MG/3ML) 0.083% nebulizer solution [id number] Yes Take 3 mLs (2.5 mg total) by nebulization every 6 (six) hours as needed for wheezing or shortness of breath. [name], MD  Active   Blood Glucose Monitoring Suppl (ACCU-CHEK GUIDE ME) w/Device Drucie Opitz [id number]  USE TO MEASURE BLOOD SUGAR TWICE A DAY [provider]  Active   Blood Glucose Monitoring Suppl (TRUE METRIX METER) w/Device KIT [id number] Yes Use to measure blood sugar twice a day [name], MD Taking Active Self  folic acid (FOLVITE) 1 MG tablet [id number] Yes Take 1 tablet (1 mg total) by mouth daily. [name], MD  Active   glucose blood (TRUE METRIX BLOOD GLUCOSE TEST) test strip [id number] Yes Use as instructed [name], MD Taking Active Self  losartan (COZAAR) 100 MG tablet [id number] Yes Take 1 tablet (100 mg total) by mouth daily. [name],  MD  Active   metFORMIN (GLUCOPHAGE) 500 MG tablet [id number] Yes Take 1 tablet (500 mg total) by mouth 2 (two) times daily with a meal. Wright, Patrick E, MD  Active   metoprolol succinate (TOPROL-XL) 25 MG 24 hr tablet [id number] Yes Take 1 tablet (25 mg total) by mouth daily. Wright, Patrick E, MD Taking Active Self  mometasone-formoterol (DULERA) 200-5 MCG/ACT AERO [id number] Yes Inhale 2 puffs into the lungs 2 (two) times daily. Wright, Patrick E, MD  Active   moxifloxacin (AVELOX) 400 MG tablet [id number]  Take 1 tablet (400 mg total) by mouth daily for 5 days. Wright, Patrick E, MD   Active   pantoprazole (PROTONIX) 40 MG tablet [id number]  Take 1 tablet (40 mg total) by mouth daily. Wright, Patrick E, MD  Active   predniSONE (DELTASONE) 20 MG tablet [id number] Yes Take 1 tablet (20 mg total) by mouth daily with breakfast. Wright, Patrick E, MD  Active   Respiratory Therapy Supplies (FLUTTER) DEVI [id number] Yes Use 4 times daily Wright, Patrick E, MD Taking Active Self  thiamine (VITAMIN B-1) 100 MG tablet [id number]  Take 1 tablet (100 mg total) by mouth daily. Wright, Patrick E, MD  Active   Tiotropium Bromide Monohydrate (SPIRIVA RESPIMAT) 2.5 MCG/ACT AERS [id number] Yes Two puff daily Wright, Patrick E, MD Taking Active Self          Assessment:   Goals Addressed            This Visit's Progress   . Patient sister stated "He needs a water pill, his whole body is swelling"       CARE PLAN ENTRY Medicaid Managed Care (see longitudinal plan of care for additional care plan information)  Current Barriers:  . Knowledge Deficits related to medications for disease management. Patient's sister concerned about patients swelling and requested a "water pill" from PCP  Nurse Case Manager Clinical Goal(s):  . Over the next 7 days, patient will verbalize understanding of plan for prescribed medications . Over the next 30 days, patient will meet with RN Care Manager to address medication needs . Over the next 30 days, patient will attend all scheduled medical appointments: 10/23/19 with Dr. Wright  Interventions:  . Inter-disciplinary care team collaboration (see longitudinal plan of care) . Evaluation of current treatment plan related to COPD and patient's adherence to plan as established by provider. . Advised patient to pick up Lasix and take as prescribed by Dr. Wright . Provided education to patient re: COPD . Reviewed medications with patient and discussed new prescription for Lasix noted in EPIC  Plan:  . Patient's sister will pick up prescribed Lasix today   . RNCM will follow up with patient and sister within 7 days   Initial goal documentation     . Patient's sister states "He is not checking his blood sugars, because he doesn't have a monitor"       CARE PLAN ENTRY Medicaid Managed Care (see longitudinal plan of care for additional care plan information)  Current Barriers:  . Chronic Disease Management support and education needs related to Diabetes-Unable to monitor blood sugars due to not having appropriate medical equipment. Patient's sister unaware blood glucose monitor and supplies prescribed  Nurse Case Manager Clinical Goal(s):  . Over the next 7 days, patient will meet with RN Care Manager to address obtaining glucose monitor . Over the next 30 days, patient will attend all scheduled medical appointments: 10/23/19 with Dr. Wright . Over the next 30 days,   patient will demonstrate improved adherence to prescribed treatment plan for Diabetes as evidenced by monitoring blood sugars as prescribed  Interventions:  . Inter-disciplinary care team collaboration (see longitudinal plan of care) . Evaluation of current treatment plan related to Diabetes and patient's adherence to plan as established by provider. . Provided education to patient re: Diabetes . Reviewed medications with patient's sister and discussed taking metformin and monitoring blood sugar . Collaborated with pharmacy regarding glucose monitor-Contacted pharmacy, glucose monitor has been picked up on 08/16/19. [name] notified of this and will look for monitor at her brother's home  . Discussed plans with patient's sister for ongoing care management follow up and provided patient's sister with direct contact information for care management team  Plan:  . Patient will attend follow up appointment with [name] . RNCM will follow up with telephone call within 7 days   Initial goal documentation        Plan:  Follow up telephone call with RNCM within 7 days  [name] RN, [place] RN Care Coordinator

## 2019-10-05 NOTE — Telephone Encounter (Signed)
Called pt/ sister ansewered / DPR on file made aware of MD note and instructions.

## 2019-10-07 DIAGNOSIS — J9621 Acute and chronic respiratory failure with hypoxia: Secondary | ICD-10-CM | POA: Diagnosis not present

## 2019-10-07 DIAGNOSIS — F79 Unspecified intellectual disabilities: Secondary | ICD-10-CM | POA: Diagnosis not present

## 2019-10-07 DIAGNOSIS — J9601 Acute respiratory failure with hypoxia: Secondary | ICD-10-CM | POA: Diagnosis not present

## 2019-10-12 ENCOUNTER — Other Ambulatory Visit: Payer: Self-pay | Admitting: *Deleted

## 2019-10-12 NOTE — Patient Outreach (Signed)
Care Coordination  10/12/2019  James [name] 1960-03-17 [id number]   An unsuccessful telephone outreach was attempted today. The patient was referred to the case management team for assistance with care management and care coordination.   Follow Up Plan: A HIPAA compliant phone message was left for the patient providing contact information and requesting a return call.  The Managed Medicaid care management team will reach out to the patient again over the next 7-14 days.   [name] RN, BSN [place]  [employer] manager

## 2019-10-12 NOTE — Patient Instructions (Signed)
Visit Information  [name]  - as a part of your Medicaid benefit, you are eligible for care management and care coordination services at no cost or copay. I was unable to reach you by phone today but would be happy to help you with your health related needs. Please feel free to call me @ [phone].   A member of the Managed Medicaid care management team will reach out to you again over the next 7-14 days.   [name] RN, BSN [place]  [employer] manager

## 2019-10-18 ENCOUNTER — Other Ambulatory Visit: Payer: Self-pay

## 2019-10-18 NOTE — Patient Instructions (Signed)
Visit Information  Mr. Maimone was given information about Medicaid Managed Care team care coordination services as a part of their Healthy [facility] Medicaid benefit. [name] verbally consented to engagement with the [name] Managed Care team.   For questions related to your Healthy [facility] health plan, please call: [phone] or visit the homepage here: [email or web address]  If you would like to schedule transportation through your Healthy [facility] plan, please call the following number at least 2 days in advance of your appointment: [phone]  Goals Addressed            This Visit's Progress   . Medication Management       Medicaid Managed Care CARE PLAN ENTRY (see longitudinal plan of care for additional care plan information)  Current Barriers:  . Social, financial, community barriers:  . Patient with complex health conditions multiple comorbidities including COPD and operations of physically getting medications from pharmacy . Self-manages medications through sister. Patient Does not use a pill box or other adherence strategies. They would like to get medications managed, delivered, and organized to once/month. Patient is unable to manage medications on his own and it falls to his sister who lives apart from patient and she states, "I need help." . Patient unable to test sugars on his own, doesn't know how to use. Coordinated with sister on technique, she will show him and/or do it herself once she feels better   Pharmacist Clinical Goal(s):  [name] Over the next 30 days, patient will work with PharmD and provider towards optimized medication management  Interventions: . Comprehensive medication review performed; medication list updated in electronic medical record . Inter-disciplinary care team collaboration (see longitudinal plan of care) . Will ask PCP about switching all meds to be taken once a day in the morning to improve  compliance. Patient will not take meds unless sister calls patient to remind him   Patient Self Care Activities:  . Patient will take medications as prescribed . Patient will focus on improved adherence from new regimen   Initial goal documentation        Please see education materials related to testing provided by MyChart link.  Patient verbalizes understanding of instructions provided today.   The Managed Medicaid care management team will reach out to the patient again over the next 30 days.      [place] Constable, Pharm.D., Managed Medicaid Pharmacist - [phone]  Blood Glucose Monitoring, Adult Monitoring your blood sugar (glucose) is an important part of managing your diabetes (diabetes mellitus). Blood glucose monitoring involves checking your blood glucose as often as directed and keeping a record (log) of your results over time. Checking your blood glucose regularly and keeping a blood glucose log can:  Help you and your health care provider adjust your diabetes management plan as needed, including your medicines or insulin.  Help you understand how food, exercise, illnesses, and medicines affect your blood glucose.  Let you know what your blood glucose is at any time. You can quickly find out if you have low blood glucose (hypoglycemia) or high blood glucose (hyperglycemia). Your health care provider will set individualized treatment goals for you. Your goals will be based on your age, other medical conditions you have, and how you respond to diabetes treatment. Generally, the goal of treatment is to maintain the following blood glucose levels:  Before meals (preprandial): 80-130 mg/dL (4.4-7.2 mmol/L).  After meals (postprandial): below 180 mg/dL (10 mmol/L).  A1c level: less than 7%. Supplies needed:  Blood glucose meter.  Test strips for your meter. Each meter has its own strips. You must use the strips that came with your meter.  A needle to prick your  finger (lancet). Do not use a lancet more than one time.  A device that holds the lancet (lancing device).  A journal or log book to write down your results. How to check your blood glucose  1. Wash your hands with soap and water. 2. Prick the side of your finger (not the tip) with the lancet. Use a different finger each time. 3. Gently rub the finger until a small drop of blood appears. 4. Follow instructions that come with your meter for inserting the test strip, applying blood to the strip, and using your blood glucose meter. 5. Write down your result and any notes. Some meters allow you to use areas of your body other than your finger (alternative sites) to test your blood. The most common alternative sites are:  Forearm.  Thigh.  Palm of the hand. If you think you may have hypoglycemia, or if you have a history of not knowing when your blood glucose is getting low (hypoglycemia unawareness), do not use alternative sites. Use your finger instead. Alternative sites may not be as accurate as the fingers, because blood flow is slower in these areas. This means that the result you get may be delayed, and it may be different from the result that you would get from your finger. Follow these instructions at home: Blood glucose log   Every time you check your blood glucose, write down your result. Also write down any notes about things that may be affecting your blood glucose, such as your diet and exercise for the day. This information can help you and your health care provider: ? Look for patterns in your blood glucose over time. ? Adjust your diabetes management plan as needed.  Check if your meter allows you to download your records to a computer. Most glucose meters store a record of glucose readings in the meter. If you have type 1 diabetes:  Check your blood glucose 2 or more times a day.  Also check your blood glucose: ? Before every insulin injection. ? Before and after  exercise. ? Before meals. ? 2 hours after a meal. ? Occasionally between 2:00 a.m. and 3:00 a.m., as directed. ? Before potentially dangerous tasks, like driving or using heavy machinery. ? At bedtime.  You may need to check your blood glucose more often, up to 6-10 times a day, if you: ? Use an insulin pump. ? Need multiple daily injections (MDI). ? Have diabetes that is not well-controlled. ? Are ill. ? Have a history of severe hypoglycemia. ? Have hypoglycemia unawareness. If you have type 2 diabetes:  If you take insulin or other diabetes medicines, check your blood glucose 2 or more times a day.  If you are on intensive insulin therapy, check your blood glucose 4 or more times a day. Occasionally, you may also need to check between 2:00 a.m. and 3:00 a.m., as directed.  Also check your blood glucose: ? Before and after exercise. ? Before potentially dangerous tasks, like driving or using heavy machinery.  You may need to check your blood glucose more often if: ? Your medicine is being adjusted. ? Your diabetes is not well-controlled. ? You are ill. General tips  Always keep your supplies with you.  If you have questions or need help, all blood glucose meters have a  24-hour "hotline" phone number that you can call. You may also contact your health care provider.  After you use a few boxes of test strips, adjust (calibrate) your blood glucose meter by following instructions that came with your meter. Contact a health care provider if:  Your blood glucose is at or above 240 mg/dL (13.3 mmol/L) for 2 days in a row.  You have been sick or have had a fever for 2 days or longer, and you are not getting better.  You have any of the following problems for more than 6 hours: ? You cannot eat or drink. ? You have nausea or vomiting. ? You have diarrhea. Get help right away if:  Your blood glucose is lower than 54 mg/dL (3 mmol/L).  You become confused or you have trouble  thinking clearly.  You have difficulty breathing.  You have moderate or large ketone levels in your urine. Summary  Monitoring your blood sugar (glucose) is an important part of managing your diabetes (diabetes mellitus).  Blood glucose monitoring involves checking your blood glucose as often as directed and keeping a record (log) of your results over time.  Your health care provider will set individualized treatment goals for you. Your goals will be based on your age, other medical conditions you have, and how you respond to diabetes treatment.  Every time you check your blood glucose, write down your result. Also write down any notes about things that may be affecting your blood glucose, such as your diet and exercise for the day. This information is not intended to replace advice given to you by your health care provider. Make sure you discuss any questions you have with your health care provider. Document Revised: 10/14/2017 Document Reviewed: 06/02/2015 Elsevier Patient Education  2020 [employer].

## 2019-10-18 NOTE — Patient Outreach (Signed)
Care Coordination - Pharmacy  10/18/2019  James [name] [birth year] [id number]  Subjective:  James [name] is an 59 y.o. year old male who is a primary patient of James Gaskins Burnett Harry, MD.    [name] was given information about Medicaid Managed Care team care coordination services today. James [name] agreed to services and verbal consent obtained  Review of patient status, laboratory and other test data was performed as part of evaluation for provision of services.   Objective:  Allergies  Allergen Reactions  . Gabapentin Other (See Comments)    hallucinations    Medications:  Medications Reviewed Today    Reviewed by [name], [facility] (Pharmacist) on 10/18/19 at 1415  Med List Status: <None>  Medication Order Taking? Sig Documenting Provider Last Dose Status Informant  Accu-Chek Softclix Lancets lancets [id number] Yes Use as instructed to check blood sugar once daily. [name], MD Taking Active Self  albuterol [facility] HFA) 108 [phone] Base) MCG/ACT inhaler [id number] Yes Inhale 2 puffs into the lungs every 6 (six) hours as needed for wheezing or shortness of breath. [name], MD Taking Active Self  albuterol (PROVENTIL) (2.5 MG/3ML) 0.083% nebulizer solution [id number] Yes Take 3 mLs (2.5 mg total) by nebulization every 6 (six) hours as needed for wheezing or shortness of breath. [name], MD Taking Active   Blood Glucose Monitoring Suppl (ACCU-CHEK GUIDE ME) w/Device Drucie Opitz [id number] Yes USE TO MEASURE BLOOD SUGAR TWICE A DAY [provider] Taking Active   Blood Glucose Monitoring Suppl (TRUE METRIX METER) w/Device KIT [id number] Yes Use to measure blood sugar twice a day [name], MD Taking Active Self  folic acid (FOLVITE) 1 MG tablet [id number] Yes Take 1 tablet (1 mg total) by mouth daily. [name], MD Taking Active   furosemide (LASIX) 20 MG tablet [id number] Yes Take 1 tablet (20 mg total) by mouth daily as needed for  edema (lower extremity). [name], MD Taking Active   glucose blood (TRUE METRIX BLOOD GLUCOSE TEST) test strip [id number] Yes Use as instructed [name], MD Taking Active Self  losartan (COZAAR) 100 MG tablet [id number] Yes Take 1 tablet (100 mg total) by mouth daily. [name], MD Taking Active   metFORMIN (GLUCOPHAGE) 500 MG tablet [id number] Yes Take 1 tablet (500 mg total) by mouth 2 (two) times daily with a meal. [name], MD Taking Active   metoprolol succinate (TOPROL-XL) 25 MG 24 hr tablet [id number] Yes Take 1 tablet (25 mg total) by mouth daily. [name], MD Taking Active Self  mometasone-formoterol [facility]) 200-5 MCG/ACT [name] [id number] Yes Inhale 2 puffs into the lungs 2 (two) times daily. [name], MD Taking Active   pantoprazole (PROTONIX) 40 MG tablet [id number] Yes Take 1 tablet (40 mg total) by mouth daily. [name], MD Taking Active   predniSONE (DELTASONE) 20 MG tablet [id number] Yes Take 1 tablet (20 mg total) by mouth daily with breakfast. [name], MD Taking Active   Respiratory Therapy Supplies (FLUTTER) DEVI [id number] Yes Use 4 times daily [name], MD Taking Active Self  thiamine (VITAMIN B-1) 100 MG tablet [id number] Yes Take 1 tablet (100 mg total) by mouth daily. [name], MD Taking Active   Tiotropium Bromide Monohydrate (SPIRIVA RESPIMAT) 2.5 MCG/ACT AERS [id number]  Two puff daily [name], MD  Active Self          Assessment:  Goals Addressed  This Visit's Progress   . Medication Management       Medicaid Managed Care CARE PLAN ENTRY (see longitudinal plan of care for additional care plan information)  Current Barriers:  . Social, financial, community barriers:  . Patient with complex health conditions multiple comorbidities including COPD and operations of physically getting medications from pharmacy . Self-manages medications through sister. Patient  Does not use a pill box or other adherence strategies. They would like to get medications managed, delivered, and organized to once/month. Patient is unable to manage medications on his own and it falls to his sister who lives apart from patient and she states, "I need help." . Patient unable to test sugars on his own, doesn't know how to use. Coordinated with sister on technique, she will show him and/or do it herself once she feels better   Pharmacist Clinical Goal(s):  [name] Over the next 30 days, patient will work with PharmD and provider towards optimized medication management  Interventions: . Comprehensive medication review performed; medication list updated in electronic medical record . Inter-disciplinary care team collaboration (see longitudinal plan of care) . Will ask PCP about switching all meds to be taken once a day in the morning to improve compliance. Patient will not take meds unless sister calls patient to remind him   Patient Self Care Activities:  . Patient will take medications as prescribed . Patient will focus on improved adherence from new regimen   Initial goal documentation        Plan:  Will ask PCP to change medication regimen to once a day   Verbal consent obtained for [facility] enhanced pharmacy services (medication synchronization, adherence packaging, delivery coordination). A medication sync plan was created to allow patient to get all medications delivered once every 30 to 90 days per patient preference. Patient understands they have freedom to choose pharmacy and clinical pharmacist will coordinate care between all prescribers and [facility].

## 2019-10-19 ENCOUNTER — Emergency Department (HOSPITAL_COMMUNITY): Payer: Medicaid Other

## 2019-10-19 ENCOUNTER — Inpatient Hospital Stay (HOSPITAL_COMMUNITY)
Admission: EM | Admit: 2019-10-19 | Discharge: 2019-10-26 | DRG: 208 | Disposition: A | Discharge status: Home Health | Payer: Medicaid Other | Attending: Internal Medicine | Admitting: Internal Medicine

## 2019-10-19 ENCOUNTER — Inpatient Hospital Stay (HOSPITAL_COMMUNITY): Payer: Medicaid Other

## 2019-10-19 DIAGNOSIS — F17220 Nicotine dependence, chewing tobacco, uncomplicated: Secondary | ICD-10-CM | POA: Diagnosis present

## 2019-10-19 DIAGNOSIS — Z79899 Other long term (current) drug therapy: Secondary | ICD-10-CM

## 2019-10-19 DIAGNOSIS — U071 COVID-19: Secondary | ICD-10-CM

## 2019-10-19 DIAGNOSIS — Z7984 Long term (current) use of oral hypoglycemic drugs: Secondary | ICD-10-CM | POA: Diagnosis not present

## 2019-10-19 DIAGNOSIS — J80 Acute respiratory distress syndrome: Secondary | ICD-10-CM | POA: Diagnosis not present

## 2019-10-19 DIAGNOSIS — J9690 Respiratory failure, unspecified, unspecified whether with hypoxia or hypercapnia: Secondary | ICD-10-CM

## 2019-10-19 DIAGNOSIS — Z7951 Long term (current) use of inhaled steroids: Secondary | ICD-10-CM | POA: Diagnosis not present

## 2019-10-19 DIAGNOSIS — J9621 Acute and chronic respiratory failure with hypoxia: Secondary | ICD-10-CM

## 2019-10-19 DIAGNOSIS — R000 Tachycardia, unspecified: Secondary | ICD-10-CM | POA: Diagnosis not present

## 2019-10-19 DIAGNOSIS — J441 Chronic obstructive pulmonary disease with (acute) exacerbation: Secondary | ICD-10-CM | POA: Diagnosis not present

## 2019-10-19 DIAGNOSIS — R451 Restlessness and agitation: Secondary | ICD-10-CM | POA: Diagnosis present

## 2019-10-19 DIAGNOSIS — J9620 Acute and chronic respiratory failure, unspecified whether with hypoxia or hypercapnia: Secondary | ICD-10-CM

## 2019-10-19 DIAGNOSIS — F79 Unspecified intellectual disabilities: Secondary | ICD-10-CM | POA: Diagnosis present

## 2019-10-19 DIAGNOSIS — Z209 Contact with and (suspected) exposure to unspecified communicable disease: Secondary | ICD-10-CM | POA: Diagnosis not present

## 2019-10-19 DIAGNOSIS — J440 Chronic obstructive pulmonary disease with acute lower respiratory infection: Secondary | ICD-10-CM | POA: Diagnosis present

## 2019-10-19 DIAGNOSIS — R29810 Facial weakness: Secondary | ICD-10-CM | POA: Diagnosis not present

## 2019-10-19 DIAGNOSIS — E1165 Type 2 diabetes mellitus with hyperglycemia: Secondary | ICD-10-CM | POA: Diagnosis present

## 2019-10-19 DIAGNOSIS — Z4682 Encounter for fitting and adjustment of non-vascular catheter: Secondary | ICD-10-CM | POA: Diagnosis not present

## 2019-10-19 DIAGNOSIS — I10 Essential (primary) hypertension: Secondary | ICD-10-CM | POA: Diagnosis present

## 2019-10-19 DIAGNOSIS — J1282 Pneumonia due to coronavirus disease 2019: Secondary | ICD-10-CM

## 2019-10-19 DIAGNOSIS — E669 Obesity, unspecified: Secondary | ICD-10-CM | POA: Diagnosis present

## 2019-10-19 DIAGNOSIS — Z7952 Long term (current) use of systemic steroids: Secondary | ICD-10-CM

## 2019-10-19 DIAGNOSIS — G9340 Encephalopathy, unspecified: Secondary | ICD-10-CM | POA: Insufficient documentation

## 2019-10-19 DIAGNOSIS — I4891 Unspecified atrial fibrillation: Secondary | ICD-10-CM | POA: Diagnosis not present

## 2019-10-19 DIAGNOSIS — Z6828 Body mass index (BMI) 28.0-28.9, adult: Secondary | ICD-10-CM | POA: Diagnosis not present

## 2019-10-19 DIAGNOSIS — R0602 Shortness of breath: Secondary | ICD-10-CM | POA: Diagnosis not present

## 2019-10-19 DIAGNOSIS — G9349 Other encephalopathy: Secondary | ICD-10-CM | POA: Diagnosis present

## 2019-10-19 HISTORY — DX: Pneumonia due to coronavirus disease 2019: J12.82

## 2019-10-19 HISTORY — DX: COVID-19: U07.1

## 2019-10-19 LAB — POCT I-STAT 7, (LYTES, BLD GAS, ICA,H+H)
Acid-Base Excess: 1 mmol/L (ref 0.0–2.0)
Bicarbonate: 26.7 mmol/L (ref 20.0–28.0)
Calcium, Ion: 1.16 mmol/L (ref 1.15–1.40)
HCT: 34 % — ABNORMAL LOW (ref 39.0–52.0)
Hemoglobin: 11.6 g/dL — ABNORMAL LOW (ref 13.0–17.0)
O2 Saturation: 99 %
Patient temperature: 97.6
Potassium: 4.4 mmol/L (ref 3.5–5.1)
Sodium: 142 mmol/L (ref 135–145)
TCO2: 28 mmol/L (ref 22–32)
pCO2 arterial: 47 mmHg (ref 32.0–48.0)
pH, Arterial: 7.359 (ref 7.350–7.450)
pO2, Arterial: 148 mmHg — ABNORMAL HIGH (ref 83.0–108.0)

## 2019-10-19 LAB — I-STAT ARTERIAL BLOOD GAS, ED
Acid-Base Excess: 4 mmol/L — ABNORMAL HIGH (ref 0.0–2.0)
Bicarbonate: 32.5 mmol/L — ABNORMAL HIGH (ref 20.0–28.0)
Calcium, Ion: 1.2 mmol/L (ref 1.15–1.40)
HCT: 37 % — ABNORMAL LOW (ref 39.0–52.0)
Hemoglobin: 12.6 g/dL — ABNORMAL LOW (ref 13.0–17.0)
O2 Saturation: 100 %
Patient temperature: 68.6
Potassium: 3.6 mmol/L (ref 3.5–5.1)
Sodium: 141 mmol/L (ref 135–145)
TCO2: 34 mmol/L — ABNORMAL HIGH (ref 22–32)
pCO2 arterial: 32 mmHg (ref 32.0–48.0)
pH, Arterial: 7.532 — ABNORMAL HIGH (ref 7.350–7.450)
pO2, Arterial: 205 mmHg — ABNORMAL HIGH (ref 83.0–108.0)

## 2019-10-19 LAB — COMPREHENSIVE METABOLIC PANEL
ALT: 27 U/L (ref 0–44)
AST: 29 U/L (ref 15–41)
Albumin: 3.9 g/dL (ref 3.5–5.0)
Alkaline Phosphatase: 52 U/L (ref 38–126)
Anion gap: 13 (ref 5–15)
BUN: 14 mg/dL (ref 6–20)
CO2: 27 mmol/L (ref 22–32)
Calcium: 9.8 mg/dL (ref 8.9–10.3)
Chloride: 101 mmol/L (ref 98–111)
Creatinine, Ser: 1.03 mg/dL (ref 0.61–1.24)
GFR, Estimated: >60 mL/min (ref >60)
Glucose, Bld: 175 mg/dL — ABNORMAL HIGH (ref 70–99)
Potassium: 5.1 mmol/L (ref 3.5–5.1)
Sodium: 141 mmol/L (ref 135–145)
Total Bilirubin: 0.4 mg/dL (ref 0.3–1.2)
Total Protein: 6.5 g/dL (ref 6.5–8.1)

## 2019-10-19 LAB — CBC WITH DIFFERENTIAL/PLATELET
Abs Immature Granulocytes: 0.15 10*3/uL — ABNORMAL HIGH (ref 0.00–0.07)
Basophils Absolute: 0.1 10*3/uL (ref 0.0–0.1)
Basophils Relative: 0 %
Eosinophils Absolute: 0.1 10*3/uL (ref 0.0–0.5)
Eosinophils Relative: 0 %
HCT: 49 % (ref 39.0–52.0)
Hemoglobin: 15.1 g/dL (ref 13.0–17.0)
Immature Granulocytes: 1 %
Lymphocytes Relative: 24 %
Lymphs Abs: 5.4 10*3/uL — ABNORMAL HIGH (ref 0.7–4.0)
MCH: 31.1 pg (ref 26.0–34.0)
MCHC: 30.8 g/dL (ref 30.0–36.0)
MCV: 100.8 fL — ABNORMAL HIGH (ref 80.0–100.0)
Monocytes Absolute: 1.7 10*3/uL — ABNORMAL HIGH (ref 0.1–1.0)
Monocytes Relative: 8 %
Neutro Abs: 15 10*3/uL — ABNORMAL HIGH (ref 1.7–7.7)
Neutrophils Relative %: 67 %
Platelets: 437 10*3/uL — ABNORMAL HIGH (ref 150–400)
RBC: 4.86 MIL/uL (ref 4.22–5.81)
RDW: 12.6 % (ref 11.5–15.5)
WBC: 22.4 10*3/uL — ABNORMAL HIGH (ref 4.0–10.5)
nRBC: 0 % (ref 0.0–0.2)

## 2019-10-19 LAB — TROPONIN I (HIGH SENSITIVITY)
Troponin I (High Sensitivity): 23 ng/L — ABNORMAL HIGH (ref <18)
Troponin I (High Sensitivity): 107 ng/L (ref <18)
Troponin I (High Sensitivity): 184 ng/L (ref <18)

## 2019-10-19 LAB — RESPIRATORY PANEL BY RT PCR (FLU A&B, COVID)
Influenza A by PCR: NEGATIVE
Influenza B by PCR: NEGATIVE
SARS Coronavirus 2 by RT PCR: POSITIVE — AB

## 2019-10-19 LAB — CBG MONITORING, ED
Glucose-Capillary: 259 mg/dL — ABNORMAL HIGH (ref 70–99)
Glucose-Capillary: 270 mg/dL — ABNORMAL HIGH (ref 70–99)

## 2019-10-19 LAB — GLUCOSE, CAPILLARY
Glucose-Capillary: 162 mg/dL — ABNORMAL HIGH (ref 70–99)
Glucose-Capillary: 164 mg/dL — ABNORMAL HIGH (ref 70–99)

## 2019-10-19 LAB — ECHOCARDIOGRAM COMPLETE
Area-P 1/2: 4.39 cm2
Calc EF: 61.2 %
Height: 69 in
S' Lateral: 3 cm
Single Plane A2C EF: 63.1 %
Single Plane A4C EF: 60.1 %
Weight: 3061.75 oz

## 2019-10-19 LAB — MRSA PCR SCREENING: MRSA by PCR: NEGATIVE

## 2019-10-19 LAB — D-DIMER, QUANTITATIVE: D-Dimer, Quant: 0.36 ug/mL-FEU (ref 0.00–0.50)

## 2019-10-19 LAB — BRAIN NATRIURETIC PEPTIDE: B Natriuretic Peptide: 123.3 pg/mL — ABNORMAL HIGH (ref 0.0–100.0)

## 2019-10-19 MED ORDER — SUCCINYLCHOLINE CHLORIDE 20 MG/ML IJ SOLN
150.0000 mg | Freq: Once | INTRAMUSCULAR | Status: AC
  Administered 2019-10-19: 150 mg via INTRAVENOUS
  Filled 2019-10-19: qty 7.5

## 2019-10-19 MED ORDER — METHYLPREDNISOLONE SODIUM SUCC 125 MG IJ SOLR
1.0000 mg/kg | Freq: Two times a day (BID) | INTRAMUSCULAR | Status: DC
  Administered 2019-10-19 (×2): 86.875 mg via INTRAVENOUS
  Filled 2019-10-19 (×2): qty 2

## 2019-10-19 MED ORDER — SODIUM CHLORIDE 0.9 % IV SOLN
500.0000 mg | INTRAVENOUS | Status: AC
  Administered 2019-10-19 – 2019-10-23 (×5): 500 mg via INTRAVENOUS
  Filled 2019-10-19 (×5): qty 500

## 2019-10-19 MED ORDER — PROSOURCE TF PO LIQD
45.0000 mL | Freq: Two times a day (BID) | ORAL | Status: DC
  Filled 2019-10-19 (×2): qty 45

## 2019-10-19 MED ORDER — FENTANYL CITRATE (PF) 100 MCG/2ML IJ SOLN: 50.0000 ug | INTRAMUSCULAR | Status: DC | PRN

## 2019-10-19 MED ORDER — SODIUM CHLORIDE 0.9 % IV SOLN
1.0000 g | INTRAVENOUS | Status: AC
  Administered 2019-10-19 – 2019-10-23 (×5): 1 g via INTRAVENOUS
  Filled 2019-10-19 (×5): qty 10

## 2019-10-19 MED ORDER — POLYETHYLENE GLYCOL 3350 17 G PO PACK
17.0000 g | PACK | Freq: Every day | ORAL | Status: DC
  Administered 2019-10-19: 17 g via ORAL
  Filled 2019-10-19: qty 1

## 2019-10-19 MED ORDER — ETOMIDATE 2 MG/ML IV SOLN
20.0000 mg | Freq: Once | INTRAVENOUS | Status: AC
  Administered 2019-10-19: 20 mg via INTRAVENOUS

## 2019-10-19 MED ORDER — ALBUTEROL (5 MG/ML) CONTINUOUS INHALATION SOLN: 20.0000 mg/h | INHALATION_SOLUTION | RESPIRATORY_TRACT | Status: DC

## 2019-10-19 MED ORDER — POLYETHYLENE GLYCOL 3350 17 G PO PACK
17.0000 g | PACK | Freq: Every day | ORAL | Status: DC
  Administered 2019-10-20 – 2019-10-21 (×2): 17 g
  Filled 2019-10-19 (×2): qty 1

## 2019-10-19 MED ORDER — HEPARIN (PORCINE) 25000 UT/250ML-% IV SOLN
1350.0000 [IU]/h | INTRAVENOUS | Status: DC
  Administered 2019-10-19: 1000 [IU]/h via INTRAVENOUS
  Administered 2019-10-20: 1300 [IU]/h via INTRAVENOUS
  Administered 2019-10-21: 1350 [IU]/h via INTRAVENOUS
  Filled 2019-10-19 (×3): qty 250

## 2019-10-19 MED ORDER — METHYLPREDNISOLONE SODIUM SUCC 125 MG IJ SOLR
1.0000 mg/kg | Freq: Two times a day (BID) | INTRAMUSCULAR | Status: AC
  Administered 2019-10-20 – 2019-10-21 (×4): 86.875 mg via INTRAVENOUS
  Filled 2019-10-19 (×4): qty 2

## 2019-10-19 MED ORDER — PANTOPRAZOLE SODIUM 40 MG IV SOLR
40.0000 mg | Freq: Every day | INTRAVENOUS | Status: DC
  Administered 2019-10-19 – 2019-10-21 (×3): 40 mg via INTRAVENOUS
  Filled 2019-10-19 (×3): qty 40

## 2019-10-19 MED ORDER — SODIUM CHLORIDE 0.9 % IV SOLN: INTRAVENOUS | Status: DC

## 2019-10-19 MED ORDER — DOCUSATE SODIUM 100 MG PO CAPS: 100.0000 mg | ORAL_CAPSULE | Freq: Two times a day (BID) | ORAL | Status: DC | PRN

## 2019-10-19 MED ORDER — INSULIN ASPART 100 UNIT/ML ~~LOC~~ SOLN
2.0000 [IU] | SUBCUTANEOUS | Status: DC
  Administered 2019-10-19 – 2019-10-21 (×13): 2 [IU] via SUBCUTANEOUS

## 2019-10-19 MED ORDER — PREDNISONE 20 MG PO TABS: 50.0000 mg | ORAL_TABLET | Freq: Every day | ORAL | Status: DC

## 2019-10-19 MED ORDER — ALBUTEROL SULFATE (2.5 MG/3ML) 0.083% IN NEBU: 2.5000 mg | INHALATION_SOLUTION | RESPIRATORY_TRACT | Status: DC | PRN

## 2019-10-19 MED ORDER — DOCUSATE SODIUM 50 MG/5ML PO LIQD
100.0000 mg | Freq: Two times a day (BID) | ORAL | Status: DC
  Administered 2019-10-20 – 2019-10-21 (×3): 100 mg
  Filled 2019-10-19 (×3): qty 10

## 2019-10-19 MED ORDER — HEPARIN BOLUS VIA INFUSION
4000.0000 [IU] | Freq: Once | INTRAVENOUS | Status: AC
  Administered 2019-10-19: 4000 [IU] via INTRAVENOUS
  Filled 2019-10-19: qty 4000

## 2019-10-19 MED ORDER — CHLORHEXIDINE GLUCONATE 0.12% ORAL RINSE (MEDLINE KIT)
15.0000 mL | Freq: Two times a day (BID) | OROMUCOSAL | Status: DC
  Administered 2019-10-19 – 2019-10-21 (×4): 15 mL via OROMUCOSAL

## 2019-10-19 MED ORDER — FENTANYL 2500MCG IN NS 250ML (10MCG/ML) PREMIX INFUSION
0.0000 ug/h | INTRAVENOUS | Status: DC
  Administered 2019-10-19: 25 ug/h via INTRAVENOUS
  Administered 2019-10-21: 75 ug/h via INTRAVENOUS
  Filled 2019-10-19 (×2): qty 250

## 2019-10-19 MED ORDER — VITAL AF 1.2 CAL PO LIQD
1000.0000 mL | ORAL | Status: DC
  Administered 2019-10-19: 1000 mL
  Filled 2019-10-19 (×2): qty 1000

## 2019-10-19 MED ORDER — POLYETHYLENE GLYCOL 3350 17 G PO PACK: 17.0000 g | PACK | Freq: Every day | ORAL | Status: DC | PRN

## 2019-10-19 MED ORDER — PROPOFOL 1000 MG/100ML IV EMUL: 0.0000 ug/kg/min | INTRAVENOUS | Status: DC

## 2019-10-19 MED ORDER — PROPOFOL 1000 MG/100ML IV EMUL
5.0000 ug/kg/min | INTRAVENOUS | Status: DC
  Administered 2019-10-19 (×3): 75 ug/kg/min via INTRAVENOUS
  Administered 2019-10-19: 5 ug/kg/min via INTRAVENOUS
  Administered 2019-10-19 – 2019-10-20 (×4): 75 ug/kg/min via INTRAVENOUS
  Administered 2019-10-20: 65 ug/kg/min via INTRAVENOUS
  Administered 2019-10-20: 75 ug/kg/min via INTRAVENOUS
  Administered 2019-10-20: 10 ug/kg/min via INTRAVENOUS
  Administered 2019-10-20: 40 ug/kg/min via INTRAVENOUS
  Administered 2019-10-21: 20 ug/kg/min via INTRAVENOUS
  Administered 2019-10-21: 30 ug/kg/min via INTRAVENOUS
  Filled 2019-10-19 (×16): qty 100

## 2019-10-19 MED ORDER — ALBUTEROL (5 MG/ML) CONTINUOUS INHALATION SOLN
INHALATION_SOLUTION | RESPIRATORY_TRACT | Status: AC
  Administered 2019-10-19: 20 mg/h via RESPIRATORY_TRACT
  Filled 2019-10-19: qty 20

## 2019-10-19 MED ORDER — SODIUM CHLORIDE 0.9 % IV SOLN
200.0000 mg | Freq: Once | INTRAVENOUS | Status: AC
  Administered 2019-10-19: 200 mg via INTRAVENOUS
  Filled 2019-10-19: qty 40

## 2019-10-19 MED ORDER — CHLORHEXIDINE GLUCONATE CLOTH 2 % EX PADS
6.0000 | MEDICATED_PAD | Freq: Every day | CUTANEOUS | Status: DC
  Administered 2019-10-19 – 2019-10-26 (×9): 6 via TOPICAL

## 2019-10-19 MED ORDER — PROSOURCE TF PO LIQD
90.0000 mL | Freq: Four times a day (QID) | ORAL | Status: DC
  Administered 2019-10-19 – 2019-10-21 (×7): 90 mL
  Filled 2019-10-19 (×8): qty 90

## 2019-10-19 MED ORDER — ASCORBIC ACID 500 MG PO TABS
500.0000 mg | ORAL_TABLET | Freq: Every day | ORAL | Status: DC
  Administered 2019-10-19 – 2019-10-21 (×3): 500 mg
  Filled 2019-10-19 (×3): qty 1

## 2019-10-19 MED ORDER — FENTANYL CITRATE (PF) 100 MCG/2ML IJ SOLN
100.0000 ug | INTRAMUSCULAR | Status: DC | PRN
  Administered 2019-10-19 – 2019-10-20 (×5): 100 ug via INTRAVENOUS
  Administered 2019-10-20: 50 ug via INTRAVENOUS
  Administered 2019-10-20: 100 ug via INTRAVENOUS
  Administered 2019-10-20: 50 ug via INTRAVENOUS
  Administered 2019-10-20 (×2): 100 ug via INTRAVENOUS
  Filled 2019-10-19 (×4): qty 2

## 2019-10-19 MED ORDER — SODIUM CHLORIDE 0.9 % IV SOLN
100.0000 mg | Freq: Every day | INTRAVENOUS | Status: AC
  Administered 2019-10-20 – 2019-10-23 (×4): 100 mg via INTRAVENOUS
  Filled 2019-10-19 (×4): qty 20

## 2019-10-19 MED ORDER — INSULIN ASPART 100 UNIT/ML ~~LOC~~ SOLN
0.0000 [IU] | SUBCUTANEOUS | Status: DC
  Administered 2019-10-19: 3 [IU] via SUBCUTANEOUS
  Administered 2019-10-19 (×2): 8 [IU] via SUBCUTANEOUS
  Administered 2019-10-19 (×2): 3 [IU] via SUBCUTANEOUS
  Administered 2019-10-20: 2 [IU] via SUBCUTANEOUS
  Administered 2019-10-20: 5 [IU] via SUBCUTANEOUS
  Administered 2019-10-20: 2 [IU] via SUBCUTANEOUS
  Administered 2019-10-20 – 2019-10-21 (×4): 3 [IU] via SUBCUTANEOUS
  Administered 2019-10-21 (×2): 2 [IU] via SUBCUTANEOUS
  Administered 2019-10-21: 5 [IU] via SUBCUTANEOUS
  Administered 2019-10-21: 2 [IU] via SUBCUTANEOUS
  Administered 2019-10-22: 3 [IU] via SUBCUTANEOUS
  Administered 2019-10-22: 5 [IU] via SUBCUTANEOUS
  Administered 2019-10-22: 2 [IU] via SUBCUTANEOUS
  Administered 2019-10-22: 5 [IU] via SUBCUTANEOUS
  Administered 2019-10-22 – 2019-10-23 (×3): 2 [IU] via SUBCUTANEOUS
  Administered 2019-10-23: 5 [IU] via SUBCUTANEOUS
  Administered 2019-10-23: 3 [IU] via SUBCUTANEOUS
  Administered 2019-10-23: 11 [IU] via SUBCUTANEOUS
  Administered 2019-10-24: 2 [IU] via SUBCUTANEOUS

## 2019-10-19 MED ORDER — ASPIRIN 325 MG PO TABS
325.0000 mg | ORAL_TABLET | Freq: Once | ORAL | Status: AC
  Administered 2019-10-19: 325 mg
  Filled 2019-10-19: qty 1

## 2019-10-19 MED ORDER — BARICITINIB 2 MG PO TABS
4.0000 mg | ORAL_TABLET | Freq: Every day | ORAL | Status: DC
  Administered 2019-10-19 – 2019-10-20 (×2): 4 mg via ORAL
  Filled 2019-10-19 (×2): qty 2

## 2019-10-19 MED ORDER — DOCUSATE SODIUM 50 MG/5ML PO LIQD
100.0000 mg | Freq: Two times a day (BID) | ORAL | Status: DC
  Administered 2019-10-19: 100 mg via ORAL
  Filled 2019-10-19 (×3): qty 10

## 2019-10-19 MED ORDER — VITAL HIGH PROTEIN PO LIQD
1000.0000 mL | ORAL | Status: AC
  Administered 2019-10-19 (×2): 1000 mL

## 2019-10-19 MED ORDER — IPRATROPIUM-ALBUTEROL 0.5-2.5 (3) MG/3ML IN SOLN
3.0000 mL | Freq: Four times a day (QID) | RESPIRATORY_TRACT | Status: DC
  Administered 2019-10-19 – 2019-10-21 (×8): 3 mL via RESPIRATORY_TRACT
  Filled 2019-10-19 (×7): qty 3

## 2019-10-19 MED ORDER — VITAL HIGH PROTEIN PO LIQD: 1000.0000 mL | ORAL | Status: DC

## 2019-10-19 MED ORDER — VITAL HIGH PROTEIN PO LIQD
1000.0000 mL | ORAL | Status: DC
  Administered 2019-10-19: 1000 mL
  Filled 2019-10-19: qty 1000

## 2019-10-19 MED ORDER — DOCUSATE SODIUM 50 MG/5ML PO LIQD: 100.0000 mg | Freq: Two times a day (BID) | ORAL | Status: DC | PRN

## 2019-10-19 MED ORDER — DEXTROSE 10 % IV SOLN: INTRAVENOUS | Status: DC

## 2019-10-19 MED ORDER — ORAL CARE MOUTH RINSE
15.0000 mL | OROMUCOSAL | Status: DC
  Administered 2019-10-19 – 2019-10-22 (×25): 15 mL via OROMUCOSAL

## 2019-10-19 MED ORDER — FENTANYL 2500MCG IN NS 250ML (10MCG/ML) PREMIX INFUSION: 0.0000 ug/h | INTRAVENOUS | Status: DC

## 2019-10-19 MED ORDER — ENOXAPARIN SODIUM 40 MG/0.4ML ~~LOC~~ SOLN: 40.0000 mg | SUBCUTANEOUS | Status: DC

## 2019-10-19 MED ORDER — ZINC SULFATE 220 (50 ZN) MG PO CAPS
220.0000 mg | ORAL_CAPSULE | Freq: Every day | ORAL | Status: DC
  Administered 2019-10-19 – 2019-10-21 (×3): 220 mg
  Filled 2019-10-19 (×3): qty 1

## 2019-10-19 MED ORDER — HEPARIN SODIUM (PORCINE) 5000 UNIT/ML IJ SOLN
5000.0000 [IU] | Freq: Three times a day (TID) | INTRAMUSCULAR | Status: DC
  Administered 2019-10-19: 5000 [IU] via SUBCUTANEOUS
  Filled 2019-10-19: qty 1

## 2019-10-19 NOTE — Plan of Care (Signed)
ABG    Component Value Date/Time   PHART 7.359 10/19/2019 1555   PCO2ART 47.0 10/19/2019 1555   PO2ART 148 (H) 10/19/2019 1555   HCO3 26.7 10/19/2019 1555   TCO2 28 10/19/2019 1555   O2SAT 99.0 10/19/2019 1555     P:F>150, no need to prone.   Julian Hy, DO 10/19/19 4:15 PM [facility]

## 2019-10-19 NOTE — ED Notes (Signed)
RT at bedside, preparing pt to be transported upstairs.

## 2019-10-19 NOTE — ED Notes (Signed)
Critical Care MD advised this RN maintain BP at map of 60 or greater.

## 2019-10-19 NOTE — ED Triage Notes (Signed)
Pt to rm 34 via EMS. EMS states pt has had increasing SOB, used at home inhaler w/o success. On arrival, pt's 02 sat=74% on room air. Solumedrol 125mg , albuterol, atrovent 1mg , mag 2gm  And cpap given by EMS. Pt in obvious distress on arrival, 02 sat=80% on cpap. MD and RT to bedside.

## 2019-10-19 NOTE — Progress Notes (Signed)
[place] Progress Note Patient Name: PAXTYN BOYAR DOB: 10/24/1960 MRN: [id number]   Date of Service  10/19/2019  HPI/Events of Note  Agitation - sedated with Propofol IV infusion at ceiling of 80 mcg/kg/min + Fentanyl IV PRN.  eICU Interventions  Plan: 1. Fentanyl IV infusion. Titrate to RASS = 0 to -1.      Intervention Category Major Interventions: Delirium, psychosis, severe agitation - evaluation and management  Emilie Carp Eugene 10/19/2019, 8:46 PM

## 2019-10-19 NOTE — Progress Notes (Signed)
  Echocardiogram 2D Echocardiogram has been performed.  James [name] 10/19/2019, 3:37 PM

## 2019-10-19 NOTE — Progress Notes (Signed)
HCPOA info & family notification pt was moved to ICU. S/w sister [name] listed in contacts.  [name] stated she had been 2nd on the [facility] and her sister Aldine Contes [phone]) had been listed as 1st but recently [name] decided she didn't want the responsibility and had her name removed from the contact list here.  Per [name] she has the [employer].   [name] stated she is recovering from [place] herself - day 5 of isolation; she was vaccinated, she said Zalman was not and that he was "around a lot of people".  I advised her about the 21 day quarantine period but that she could call and request a video conf.  All her questions were answered.

## 2019-10-19 NOTE — Progress Notes (Signed)
20mg  CAT ended at 0830. Will resume if reordered, or q6 treatments to start on schedule. RT to monitor.

## 2019-10-19 NOTE — Progress Notes (Signed)
ANTICOAGULATION CONSULT NOTE - Initial Consult  Pharmacy Consult for heparin Indication: chest pain/ACS  Allergies  Allergen Reactions  . Gabapentin Other (See Comments)    hallucinations    Patient Measurements: Height: 5\' 9"  (175.3 cm) Weight: 86.8 kg (191 lb 5.8 oz) IBW/kg (Calculated) : 70.7  Vital Signs: Temp: 98.6 F (37 C) (10/15 1130) BP: 112/60 (10/15 1130) Pulse Rate: 104 (10/15 1149)  Labs: Recent Labs    10/19/19 0235 10/19/19 0355 10/19/19 0450 10/19/19 1200  HGB 15.1 12.6*  --   --   HCT 49.0 37.0*  --   --   PLT 437*  --   --   --   CREATININE 1.03  --   --   --   TROPONINIHS 23*  --  107* 184*    Estimated Creatinine Clearance: 85.3 mL/min (by C-G formula based on SCr of 1.03 mg/dL).  Assessment: 59 yo m presenting with COVID, now with rising troponin   Cbc stable - no AC PTA  Goal of Therapy:  Heparin level 0.3-0.7 units/ml Monitor platelets by anticoagulation protocol: Yes   Plan:  Dc enox Heparin bolus 4000 units/hr Heparin gtt 1000 units/hr Initial hep lvl 2200 Daily hep lvl cbc  Barth Kirks, PharmD, BCPS, BCCCP Clinical Pharmacist [phone]  Please check AMION for all [place] numbers  10/19/2019 2:01 PM

## 2019-10-19 NOTE — Progress Notes (Signed)
Initial Nutrition Assessment  DOCUMENTATION CODES:   Not applicable  INTERVENTION:   Tube feeding via OG tube: Vital AF 1.2 at 35 ml/h (840 ml per day) Prosource TF 90 ml QID  Provides 1328 kcal, 151 gm protein, 681 ml free water daily  TF regimen and propofol at current rate providing 2357 total kcal/day    NUTRITION DIAGNOSIS:   Increased nutrient needs related to catabolic illness (HFGBM-21 PNA) as evidenced by estimated needs.  GOAL:   Patient will meet greater than or equal to 90% of their needs  MONITOR:   TF tolerance, Vent status  REASON FOR ASSESSMENT:   Consult, Ventilator Enteral/tube feeding initiation and management  ASSESSMENT:   Pt with PMH of COPD, tobacco and ETOH abuse, HTN, low back pain, intellectual disability, atopic dermatitis, herpes simplex disease, DM, and bronchiectasis admitted from home 10/15 with COVID-19 PNA and quickly required intubation.   Patient is currently intubated on ventilator support MV: 12.6 L/min Temp (24hrs), Avg:98.3 F (36.8 C), Min:97.2 F (36.2 C), Max:98.8 F (37.1 C)  Propofol: 39 ml/hr provides: 1029 kcal  Medications reviewed and include: vitamin C, colace, SSI, solumedrol, miralax, zinc  Remdesivir  Labs reviewed: troponin: 184 CBG's: 259-270  OG tube looped in the stomach Current TF: Vital High Protein @ 40 ml/hr with 45 ml ProSource TF BID Provides: 1040 kcal and 104 grams protein  NUTRITION - FOCUSED PHYSICAL EXAM:  Deferred   Diet Order:   Diet Order            Diet NPO time specified  Diet effective now                 EDUCATION NEEDS:   No education needs have been identified at this time  Skin:     Last BM:  unknown  Height:   Ht Readings from Last 1 Encounters:  10/19/19 5\' 9"  (1.753 m)    Weight:   Wt Readings from Last 1 Encounters:  10/19/19 86.8 kg    Ideal Body Weight:     BMI:  Body mass index is 28.26 kg/m.  Estimated Nutritional Needs:   Kcal:   1800-2200  Protein:  145-175 grams  Fluid:  2 L/day  Lockie Pares., RD, LDN, CNSC See AMiON for contact information

## 2019-10-19 NOTE — Progress Notes (Addendum)
BP (!) 142/69   Pulse (!) 125   Temp 98.1 F (36.7 C)   Resp 20   Ht 5\' 9"  (1.753 m)   Wt 86.8 kg   SpO2 98%   BMI 28.26 kg/m  Seen in the ED this morning: Intubated, sedated.  Mild vent dyssynchrony, Peak pressure 19. No wheezing. Breathing above the vent. Tachycardic, regular rhythm Rash on multiple surfaces of body- scaling with erythematous leading edge- in left axilla, patch on left abdomen, over entire groin area. PERRL, RASS -5  CXR reviewed- ETT ~7cm above carina KUB: OGT in stomach Trop 23> 107   Plan: Repeat EKG ; serial troponin. Aspirin. Adding azithromycin; con't ceftriaxone. Blood cx being drawn currently. NE to maintain MAP >65; not currently requiring. Will hold off on CVC for now. Order to advance ETT placed 3cm TF orders. Ok to use current OGT Hyperglycemic- no insulin given yet. Con't SSI, but may need basal-bolus insulin or drip depending on response. Covid meds per protocol  GI ppx: pantoprazole DVT: lovenox    Follow up troponin increasing, but EKG without ischemic changes. Start heparin gtt, echocardiogram ordered.   This patient is critically ill with multiple organ system failure which requires frequent high complexity decision making, assessment, support, evaluation, and titration of therapies. This was completed through the application of advanced monitoring technologies and extensive interpretation of multiple databases. During this encounter critical care time was devoted to patient care services described in this note for 28 minutes.  Julian Hy, DO 10/19/19 1:52 PM [facility]

## 2019-10-19 NOTE — ED Provider Notes (Addendum)
[facility] EMERGENCY DEPARTMENT Provider Note   CSN: [id number] Arrival date & time: 10/19/19  0222     History Chief Complaint  Patient presents with  . Shortness of Breath    James [name] is a 59 y.o. male.  Patient presents to the emergency department for evaluation of difficulty breathing.  Patient brought to the emergency department from home by ambulance.  Patient unable to speak at arrival due to severe shortness of breath.  Level 5 caveat.  EMS reports that they were told that the patient had been experiencing progressively worsening shortness of breath and then symptoms severely worsened tonight.  Room air oxygen saturation was 74%.  Patient placed on CPAP with only minimal improvement.        Past Medical History:  Diagnosis Date  . Acute on chronic respiratory failure with hypoxia ([place]) 06/08/2013  . CAP (community acquired pneumonia) 05/17/2018   See admit 05/17/18 ? rml  with   covid pcr neg - rx augmentin > f/u cxr in 4-6 weeks is fine unless condition declines   . Community acquired pneumonia of right lower lobe of lung 05/17/2018   See admit 05/17/18 ? rml  with   covid pcr neg - rx augmentin > f/u cxr in 4-6 weeks is fine unless condition declines   . COPD (chronic obstructive pulmonary disease) ([place])    not on home  . Diabetes ([place])   . Dyspnea   . Hypertension   . Pneumonia 04/06/2016  . Pneumothorax    2016, fell from ladder  . 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 Tick bite 06/03/2018    Patient Active Problem List   Diagnosis Date Noted  . Bronchiectasis ([place]) 09/24/2019  . Type 2 diabetes mellitus without complication, without long-term current use of insulin ([place]) 07/24/2019  . 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 Other atopic dermatitis 05/01/2019  . Chronic respiratory failure with hypoxia ([name]) 04/23/2019  . Intellectual disability 04/23/2019  . 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 Marijuana abuse 08/09/2018  . COPD  exacerbation ([place]) 02/16/2018  . Hypertension 05/17/2016  . Low back pain radiating to both legs 04/18/2016  . History of tobacco abuse 04/06/2016  . Alcohol use disorder, moderate, dependence ([place]) 04/06/2016  . COPD mixed type ([place]) 03/26/2016    Past Surgical History:  Procedure Laterality Date  . VIDEO ASSISTED THORACOSCOPY (VATS)/THOROCOTOMY Left 06/11/2013   Procedure: VIDEO ASSISTED THORACOSCOPY (VATS)/THOROCOTOMY;  Surgeon: Ivin Poot, MD;  Location: [facility];  Service: Thoracic;  Laterality: Left;  [name] VIDEO BRONCHOSCOPY N/A 06/11/2013   Procedure: VIDEO BRONCHOSCOPY;  Surgeon: Ivin Poot, MD;  Location: [facility];  Service: Thoracic;  Laterality: N/A;  . VIDEO BRONCHOSCOPY N/A 08/10/2018   Procedure: VIDEO BRONCHOSCOPY WITH FLUORO;  Surgeon: Candee Furbish, MD;  Location: [facility];  Service: Endoscopy;  Laterality: N/A;       Family History  Problem Relation Age of Onset  . Heart disease Father   . COPD Sister     Social History   Tobacco Use  . Smoking status: Former Smoker    Packs/day: 1.50    Years: 40.00    Pack years: 60.00    Types: Cigarettes    Quit date: 2014    Years since quitting: 7.7  . Smokeless tobacco: Current User    Types: Chew  Vaping Use  . Vaping Use: Never used  Substance Use Topics  . Alcohol use: Yes    Alcohol/week: 28.0 standard drinks  Types: 28 Cans of beer per week    Comment: 4 beers a night; + jitters with not drinking, denies DTs or seizures  . Drug use: Yes    Types: Marijuana    Comment: daily; quit using crack and methamphetamine about 5 months ago     Home Medications Prior to Admission medications   Medication Sig Start Date End Date Taking? Authorizing Provider  Accu-Chek Softclix Lancets lancets Use as instructed to check blood sugar once daily. 08/15/19   [name], MD  albuterol (PROAIR HFA) 108 (90 Base) MCG/ACT inhaler Inhale 2 puffs into the lungs every 6 (six) hours as needed for wheezing or  shortness of breath. 08/23/19   [name], MD  albuterol (PROVENTIL) (2.5 MG/3ML) 0.083% nebulizer solution Take 3 mLs (2.5 mg total) by nebulization every 6 (six) hours as needed for wheezing or shortness of breath. 10/02/19   [name], MD  Blood Glucose Monitoring Suppl (ACCU-CHEK GUIDE ME) w/Device KIT USE TO MEASURE BLOOD SUGAR TWICE A DAY 08/15/19   [provider]  Blood Glucose Monitoring Suppl (TRUE METRIX METER) w/Device KIT Use to measure blood sugar twice a day 08/14/19   [name], MD  folic acid (FOLVITE) 1 MG tablet Take 1 tablet (1 mg total) by mouth daily. 10/02/19   [name], MD  furosemide (LASIX) 20 MG tablet Take 1 tablet (20 mg total) by mouth daily as needed for edema (lower extremity). 10/05/19   [name], MD  glucose blood (TRUE METRIX BLOOD GLUCOSE TEST) test strip Use as instructed 08/14/19   [name], MD  losartan (COZAAR) 100 MG tablet Take 1 tablet (100 mg total) by mouth daily. 10/02/19   [name], MD  metFORMIN (GLUCOPHAGE) 500 MG tablet Take 1 tablet (500 mg total) by mouth 2 (two) times daily with a meal. 10/02/19 12/01/19  [name], MD  metoprolol succinate (TOPROL-XL) 25 MG 24 hr tablet Take 1 tablet (25 mg total) by mouth daily. 07/23/19   [name], MD  mometasone-formoterol (DULERA) 200-5 MCG/ACT AERO Inhale 2 puffs into the lungs 2 (two) times daily. 10/02/19   [name], MD  pantoprazole (PROTONIX) 40 MG tablet Take 1 tablet (40 mg total) by mouth daily. 10/02/19   [name], MD  predniSONE (DELTASONE) 20 MG tablet Take 1 tablet (20 mg total) by mouth daily with breakfast. 10/02/19   [name], MD  Respiratory Therapy Supplies (FLUTTER) DEVI Use 4 times daily 04/27/18   [name], MD  thiamine (VITAMIN B-1) 100 MG tablet Take 1 tablet (100 mg total) by mouth daily. 10/02/19   [name], MD  Tiotropium Bromide Monohydrate (SPIRIVA RESPIMAT) 2.5 MCG/ACT  AERS Two puff daily 08/23/19   [name], MD    Allergies    Gabapentin  Review of Systems   Review of Systems  Unable to perform ROS: Acuity of condition    Physical Exam Updated Vital Signs BP 124/80   Pulse (!) 132   Resp (!) 25   Ht 5' 9"  (1.753 m)   Wt 86.8 kg   SpO2 100%   BMI 28.26 kg/m   Physical Exam Vitals and nursing note reviewed.  Constitutional:      General: He is in acute distress.     Appearance: He is ill-appearing and diaphoretic.  HENT:     Head: Atraumatic.  Eyes:     Pupils: Pupils are equal,  round, and reactive to light.  Cardiovascular:     Rate and Rhythm: Tachycardia present. Rhythm irregular.  Pulmonary:     Effort: Tachypnea, accessory muscle usage, prolonged expiration and respiratory distress (Severe) present.     Breath sounds: Decreased air movement (Minimal breath sounds present in all fields) present.  Musculoskeletal:        General: No swelling. Normal range of motion.     Cervical back: Normal range of motion and neck supple.  Skin:    General: Skin is warm.  Neurological:     Cranial Nerves: No cranial nerve deficit.     Sensory: No sensory deficit.     ED Results / Procedures / Treatments   Labs (all labs ordered are listed, but only abnormal results are displayed) Labs Reviewed  CBC WITH DIFFERENTIAL/PLATELET - Abnormal; Notable for the following components:      Result Value   WBC 22.4 (*)    MCV 100.8 (*)    Platelets 437 (*)    Neutro Abs 15.0 (*)    Lymphs Abs 5.4 (*)    Monocytes Absolute 1.7 (*)    Abs Immature Granulocytes 0.15 (*)    All other components within normal limits  BRAIN NATRIURETIC PEPTIDE - Abnormal; Notable for the following components:   B Natriuretic Peptide 123.3 (*)    All other components within normal limits  I-STAT ARTERIAL BLOOD GAS, ED - Abnormal; Notable for the following components:   pH, Arterial 7.532 (*)    pO2, Arterial 205 (*)    Bicarbonate 32.5 (*)    TCO2 34 (*)     Acid-Base Excess 4.0 (*)    HCT 37.0 (*)    Hemoglobin 12.6 (*)    All other components within normal limits  RESPIRATORY PANEL BY RT PCR (FLU A&B, COVID)  COMPREHENSIVE METABOLIC PANEL  TROPONIN I (HIGH SENSITIVITY)  TROPONIN I (HIGH SENSITIVITY)    EKG EKG Interpretation  Date/Time:  Friday October 19 2019 02:23:15 EDT Ventricular Rate:  185 PR Interval:    QRS Duration: 96 QT Interval:  255 QTC Calculation: 448 R Axis:   75 Text Interpretation: Atrial fibrillation with rapid V-rate Ventricular premature complex Anteroseptal infarct, old Artifact in lead(s) I II III aVR aVL aVF V4 V5 V6 Confirmed by Orpah Greek [phone]) on 10/19/2019 3:33:45 AM   Radiology DG Abdomen 1 View  Result Date: 10/19/2019 CLINICAL DATA:  Nasogastric tube placement EXAM: ABDOMEN - 1 VIEW COMPARISON:  02/19/2019 FINDINGS: Nasogastric tube is seen looped within the gastric lumen. The visualized gas pattern is unremarkable. Pelvis excluded from view. IMPRESSION: Nasogastric tube looped within the gastric lumen. Electronically Signed   By: Fidela Salisbury MD   On: 10/19/2019 03:15   DG Chest Port 1 View  Result Date: 10/19/2019 CLINICAL DATA:  Dyspnea, respiratory failure EXAM: PORTABLE CHEST 1 VIEW COMPARISON:  09/22/2019 FINDINGS: Endotracheal tube is seen at the level of the clavicular heads 7.5 cm above the carina. Nasogastric tube extends into the upper abdomen beyond the margin of the examination. Subtle airspace infiltrate within the right lung base is improved since remote prior examinations, though persists. Stable left pleural thickening at the costophrenic angle. No pneumothorax or pleural effusion. Cardiac size within normal limits. Multiple healed left rib fractures are noted. IMPRESSION: Endotracheal tube at the level of the clavicular heads. Improved right basilar airspace infiltrate. Electronically Signed   By: Fidela Salisbury MD   On: 10/19/2019 03:14    Procedures Procedure  Name:  Intubation Date/Time: 10/19/2019 3:00 AM Performed by: Orpah Greek, MD Pre-anesthesia Checklist: Patient identified, Patient being monitored, Emergency Drugs available, Timeout performed and Suction available Oxygen Delivery Method: Non-rebreather mask Preoxygenation: Pre-oxygenation with 100% oxygen Induction Type: Rapid sequence Ventilation: Mask ventilation without difficulty Laryngoscope Size: Glidescope and 3 Grade View: Grade I Tube size: 7.5 mm Number of attempts: 1 Placement Confirmation: ETT inserted through vocal cords under direct vision,  CO2 detector and Breath sounds checked- equal and bilateral Dental Injury: Teeth and Oropharynx as per pre-operative assessment     .Critical Care Performed by: Orpah Greek, MD Authorized by: Orpah Greek, MD   Critical care provider statement:    Critical care time (minutes):  45   Critical care time was exclusive of:  Separately billable procedures and treating other patients   Critical care was necessary to treat or prevent imminent or life-threatening deterioration of the following conditions:  Respiratory failure   Critical care was time spent personally by me on the following activities:  Ordering and performing treatments and interventions, ordering and review of laboratory studies, discussions with consultants, ordering and review of radiographic studies, pulse oximetry, evaluation of patient's response to treatment, re-evaluation of patient's condition, review of old charts, examination of patient and obtaining history from patient or surrogate   I assumed direction of critical care for this patient from another provider in my specialty: no     (including critical care time)  Medications Ordered in ED Medications  propofol (DIPRIVAN) 1000 MG/100ML infusion (80 mcg/kg/min  86.8 kg Intravenous Rate/Dose Change 10/19/19 0311)  albuterol (PROVENTIL,VENTOLIN) solution continuous neb (20 mg/hr  Nebulization New Bag/Given 10/19/19 0310)  fentaNYL (SUBLIMAZE) injection 100 mcg (100 mcg Intravenous Given 10/19/19 0350)  etomidate (AMIDATE) injection 20 mg (20 mg Intravenous Given 10/19/19 0230)  succinylcholine (ANECTINE) injection 150 mg (150 mg Intravenous Given 10/19/19 0230)    ED Course  I have reviewed the triage vital signs and the nursing notes.  Pertinent labs & imaging results that were available during my care of the patient were reviewed by me and considered in my medical decision making (see chart for details).    MDM Rules/Calculators/A&P                          Patient presents to the emergency department for evaluation of severe respiratory distress.  Patient has a known history of COPD.  Patient placed on CPAP, administered 10 mg of albuterol, 1 mg of Atrovent, 2 g of magnesium, 125 mg of Solu-Medrol by EMS during transport.  At arrival to the emergency department, oxygen saturations are 80% on CPAP with respiratory rate of 50.  Decision to intubate was made.  Patient was intubated without difficulty.  Initial EKG revealed narrow complex tachycardia that was irregular.  This could be atrial fibrillation although multifocal atrial tachycardia would be more likely.  After intubation his heart rate improved and he is now clearly in a sinus tachycardia.  Chest x-ray without evidence of heart failure or pneumonia.  Blood work unremarkable.  Patient will require admission to the ICU for acute hypoxic respiratory failure secondary to COPD exacerbation.   Final Clinical Impression(s) / ED Diagnoses Final diagnoses:  COPD exacerbation ([place])  Acute on chronic respiratory failure with hypoxia [facility])    Rx / DC Orders ED Discharge Orders    None       Loc Feinstein, Gwenyth Allegra, MD 10/19/19 [place],  MD 10/19/19 0522

## 2019-10-19 NOTE — H&P (Signed)
NAME:  James [name], MRN:  [id number], DOB:  1960-07-12, LOS: 0 ADMISSION DATE:  10/19/2019, CONSULTATION DATE:  10/19/19 REFERRING MD:  Betsey Holiday  CHIEF COMPLAINT:  Dyspnea   Brief History   James [name] is a 59 y.o. male who was admitted 10/15 with dyspnea.  He required intubation in ED given no improvement with CPAP.  COVID later returned positive.  History of present illness   Pt is encephelopathic; therefore, this HPI is obtained from chart review. James [name] is a 59 y.o. male who has a PMH as outlined below.  He presented to [facility] ED 10/15 with significant dyspnea to the point that he could not speak in full sentences.  He was placed on CPAP but did not respond; therefore, he was intubated.  After intubation, COVID returned positive.  He has been evaluated by pulmonary on prior admissions given recurrent PNA.  He has bronchoscopy in Aug 2020 which showed severe bronchiectasis bilaterally, neutrophil predominance and transbronchial biopsies were benign.  Past Medical History  has COPD mixed type ([place]); History of tobacco abuse; Alcohol use disorder, moderate, dependence ([place]); Low back pain radiating to both legs; Hypertension; COPD exacerbation ([place]); Marijuana abuse; Allergy to mold; Chronic respiratory failure with hypoxia ([place]); Intellectual disability; Other atopic dermatitis; Herpes simplex disease; Type 2 diabetes mellitus without complication, without long-term current use of insulin ([place]); Bronchiectasis ([place]); and Pneumonia due to COVID-19 virus on their problem list.  [facility] Events   10/15 > admit.  Consults:  None.  Procedures:  ETT 10/15 >   Significant Diagnostic Tests:  CXR 10/15 > neg.  Micro Data:  Flu 10/15 > neg COVID 10/15 > positive Blood 10/15 >  Sputum 10/15 >   Antimicrobials:  Remdesivir 10/15 (5 days) >  Baricitinib 10/15 (14 days) >    Interim history/subjective:  Sedated on vent.  Objective:  Blood pressure 124/80, pulse  (!) 132, resp. rate (!) 25, height 5' 9"  (1.753 m), weight 86.8 kg, SpO2 96 %.    Vent Mode: PRVC FiO2 (%):  [60 %-100 %] 60 % Set Rate:  [22 bmp] 22 bmp Vt Set:  [565 mL] 565 mL PEEP:  [5 cmH20] 5 cmH20 Plateau Pressure:  [21 cmH20] 21 cmH20  No intake or output data in the 24 hours ending 10/19/19 0520 Filed Weights   10/19/19 0200  Weight: 86.8 kg    Examination: General: Adult male, chronically ill appearing, in NAD. Neuro: Sedated on vent. HEENT: [place]/AT. Sclerae anicteric.  ETT in place. Cardiovascular: RRR, no M/R/G.  Lungs: Respirations even and unlabored.  CTA bilaterally, No W/R/R.  Abdomen: BS x 4, soft, NT/ND.  Musculoskeletal: No gross deformities, no edema.  Skin: Facial erythema, warm, no rashes.  Assessment & Plan:   COVID PNA - s/p intubation in ED. - Continue full vent support. - Goal plateau pressures < 30, driving pressure < 97LG H2O. - Daily weaning as able. - Start solumedrol, Baricitinib, Remdesivir. - Follow ABG, start proning if P/F < 150. - Follow CXR.  COPD exacerbation - 2/2 above. - Empiric ceftriaxone x 5 days. - BD's.  Hx DM - expect worsening with steroids. - SSI.  Nutrition. - TF's.  Best Practice:  Diet: NPO. Pain/Anxiety/Delirium protocol (if indicated): Propofol gtt / Fentanyl PRN. RASS goal -1. VAP protocol (if indicated): In place. DVT prophylaxis: SCD's / Heparin. GI prophylaxis: PPI. Glucose control: SSI. Mobility: Bedrest. Code Status: Full. Family Communication: None available. Disposition: ICU.  Labs   CBC: Recent  Labs  Lab 10/19/19 0235 10/19/19 0355  WBC 22.4*  --   NEUTROABS 15.0*  --   HGB 15.1 12.6*  HCT 49.0 37.0*  MCV 100.8*  --   PLT 437*  --    Basic Metabolic Panel: Recent Labs  Lab 10/19/19 0235 10/19/19 0355  NA 141 141  K 5.1 3.6  CL 101  --   CO2 27  --   GLUCOSE 175*  --   BUN 14  --   CREATININE 1.03  --   CALCIUM 9.8  --    GFR: Estimated Creatinine Clearance: 85.3 mL/min (by  C-G formula based on SCr of 1.03 mg/dL). Recent Labs  Lab 10/19/19 0235  WBC 22.4*   Liver Function Tests: Recent Labs  Lab 10/19/19 0235  AST 29  ALT 27  ALKPHOS 52  BILITOT 0.4  PROT 6.5  ALBUMIN 3.9   No results for input(s): LIPASE, AMYLASE in the last 168 hours. No results for input(s): AMMONIA in the last 168 hours. ABG    Component Value Date/Time   PHART 7.532 (H) 10/19/2019 0355   PCO2ART 32.0 10/19/2019 0355   PO2ART 205 (H) 10/19/2019 0355   HCO3 32.5 (H) 10/19/2019 0355   TCO2 34 (H) 10/19/2019 0355   O2SAT 100.0 10/19/2019 0355    Coagulation Profile: No results for input(s): INR, PROTIME in the last 168 hours. Cardiac Enzymes: No results for input(s): CKTOTAL, CKMB, CKMBINDEX, TROPONINI in the last 168 hours. HbA1C: Hgb A1c MFr Bld  Date/Time Value Ref Range Status  09/22/2019 03:55 AM 6.9 (H) 4.8 - 5.6 % Final    Comment:    (NOTE) Pre diabetes:          5.7%-6.4%  Diabetes:              >6.4%  Glycemic control for   <7.0% adults with diabetes   06/22/2019 03:13 AM 6.2 (H) 4.8 - 5.6 % Final    Comment:    (NOTE)         Prediabetes: 5.7 - 6.4         Diabetes: >6.4         Glycemic control for adults with diabetes: <7.0    CBG: No results for input(s): GLUCAP in the last 168 hours.  Review of Systems:   Unable to obtain as pt is encephalopathic.  Past medical history  He,  has a past medical history of Acute on chronic respiratory failure with hypoxia ([place]) (06/08/2013), CAP (community acquired pneumonia) (05/17/2018), Community acquired pneumonia of right lower lobe of lung (05/17/2018), COPD (chronic obstructive pulmonary disease) ([place]), Diabetes ([place]), Dyspnea, Hypertension, Pneumonia (04/06/2016), Pneumothorax, Post-herpetic polyneuropathy (10/05/2018), and Tick bite (06/03/2018).   Surgical History    Past Surgical History:  Procedure Laterality Date  . VIDEO ASSISTED THORACOSCOPY (VATS)/THOROCOTOMY Left 06/11/2013   Procedure: VIDEO  ASSISTED THORACOSCOPY (VATS)/THOROCOTOMY;  Surgeon: Ivin Poot, MD;  Location: [place];  Service: Thoracic;  Laterality: Left;  [name] VIDEO BRONCHOSCOPY N/A 06/11/2013   Procedure: VIDEO BRONCHOSCOPY;  Surgeon: Ivin Poot, MD;  Location: [place];  Service: Thoracic;  Laterality: N/A;  . VIDEO BRONCHOSCOPY N/A 08/10/2018   Procedure: VIDEO BRONCHOSCOPY WITH FLUORO;  Surgeon: Candee Furbish, MD;  Location: [facility] ENDOSCOPY;  Service: Endoscopy;  Laterality: N/A;     Social History   reports that he quit smoking about 7 years ago. His smoking use included cigarettes. He has a 60.00 pack-year smoking history. His smokeless tobacco use includes chew. He  reports current alcohol use of about 28.0 standard drinks of alcohol per week. He reports current drug use. Drug: Marijuana.   Family history   His family history includes COPD in his sister; Heart disease in his father.   Allergies Allergies  Allergen Reactions  . Gabapentin Other (See Comments)    hallucinations     Home meds  Prior to Admission medications   Medication Sig Start Date End Date Taking? Authorizing Provider  Accu-Chek Softclix Lancets lancets Use as instructed to check blood sugar once daily. 08/15/19   [name], MD  albuterol (PROAIR HFA) 108 (90 Base) MCG/ACT inhaler Inhale 2 puffs into the lungs every 6 (six) hours as needed for wheezing or shortness of breath. 08/23/19   [name], MD  albuterol (PROVENTIL) (2.5 MG/3ML) 0.083% nebulizer solution Take 3 mLs (2.5 mg total) by nebulization every 6 (six) hours as needed for wheezing or shortness of breath. 10/02/19   [name], MD  Blood Glucose Monitoring Suppl (ACCU-CHEK GUIDE ME) w/Device KIT USE TO MEASURE BLOOD SUGAR TWICE A DAY 08/15/19   [provider]  Blood Glucose Monitoring Suppl (TRUE METRIX METER) w/Device KIT Use to measure blood sugar twice a day 08/14/19   [name], MD  folic acid (FOLVITE) 1 MG tablet Take 1 tablet (1 mg total)  by mouth daily. 10/02/19   [name], MD  furosemide (LASIX) 20 MG tablet Take 1 tablet (20 mg total) by mouth daily as needed for edema (lower extremity). 10/05/19   [name], MD  glucose blood (TRUE METRIX BLOOD GLUCOSE TEST) test strip Use as instructed 08/14/19   [name], MD  losartan (COZAAR) 100 MG tablet Take 1 tablet (100 mg total) by mouth daily. 10/02/19   [name], MD  metFORMIN (GLUCOPHAGE) 500 MG tablet Take 1 tablet (500 mg total) by mouth 2 (two) times daily with a meal. 10/02/19 12/01/19  [name], MD  metoprolol succinate (TOPROL-XL) 25 MG 24 hr tablet Take 1 tablet (25 mg total) by mouth daily. 07/23/19   [name], MD  mometasone-formoterol (DULERA) 200-5 MCG/ACT AERO Inhale 2 puffs into the lungs 2 (two) times daily. 10/02/19   [name], MD  pantoprazole (PROTONIX) 40 MG tablet Take 1 tablet (40 mg total) by mouth daily. 10/02/19   [name], MD  predniSONE (DELTASONE) 20 MG tablet Take 1 tablet (20 mg total) by mouth daily with breakfast. 10/02/19   [name], MD  Respiratory Therapy Supplies (FLUTTER) DEVI Use 4 times daily 04/27/18   [name], MD  thiamine (VITAMIN B-1) 100 MG tablet Take 1 tablet (100 mg total) by mouth daily. 10/02/19   [name], MD  Tiotropium Bromide Monohydrate (SPIRIVA RESPIMAT) 2.5 MCG/ACT AERS Two puff daily 08/23/19   [name], MD    Critical care time: 40 min.    Montey Hora, [place] 10/19/2019, 5:20 AM

## 2019-10-20 ENCOUNTER — Inpatient Hospital Stay (HOSPITAL_COMMUNITY): Payer: Medicaid Other

## 2019-10-20 ENCOUNTER — Encounter (HOSPITAL_COMMUNITY): Payer: Medicaid Other

## 2019-10-20 DIAGNOSIS — U071 COVID-19: Secondary | ICD-10-CM | POA: Diagnosis not present

## 2019-10-20 LAB — POCT I-STAT 7, (LYTES, BLD GAS, ICA,H+H)
Acid-Base Excess: 6 mmol/L — ABNORMAL HIGH (ref 0.0–2.0)
Bicarbonate: 31.3 mmol/L — ABNORMAL HIGH (ref 20.0–28.0)
Calcium, Ion: 1.24 mmol/L (ref 1.15–1.40)
HCT: 34 % — ABNORMAL LOW (ref 39.0–52.0)
Hemoglobin: 11.6 g/dL — ABNORMAL LOW (ref 13.0–17.0)
O2 Saturation: 95 %
Potassium: 4.7 mmol/L (ref 3.5–5.1)
Sodium: 142 mmol/L (ref 135–145)
TCO2: 33 mmol/L — ABNORMAL HIGH (ref 22–32)
pCO2 arterial: 48.6 mmHg — ABNORMAL HIGH (ref 32.0–48.0)
pH, Arterial: 7.416 (ref 7.350–7.450)
pO2, Arterial: 76 mmHg — ABNORMAL LOW (ref 83.0–108.0)

## 2019-10-20 LAB — HEPATIC FUNCTION PANEL
ALT: 22 U/L (ref 0–44)
AST: 18 U/L (ref 15–41)
Albumin: 2.8 g/dL — ABNORMAL LOW (ref 3.5–5.0)
Alkaline Phosphatase: 36 U/L — ABNORMAL LOW (ref 38–126)
Bilirubin, Direct: <0.1 mg/dL (ref 0.0–0.2)
Total Bilirubin: 0.7 mg/dL (ref 0.3–1.2)
Total Protein: 5 g/dL — ABNORMAL LOW (ref 6.5–8.1)

## 2019-10-20 LAB — CBC
HCT: 38.1 % — ABNORMAL LOW (ref 39.0–52.0)
Hemoglobin: 11.7 g/dL — ABNORMAL LOW (ref 13.0–17.0)
MCH: 31.1 pg (ref 26.0–34.0)
MCHC: 30.7 g/dL (ref 30.0–36.0)
MCV: 101.3 fL — ABNORMAL HIGH (ref 80.0–100.0)
Platelets: 268 10*3/uL (ref 150–400)
RBC: 3.76 MIL/uL — ABNORMAL LOW (ref 4.22–5.81)
RDW: 12.8 % (ref 11.5–15.5)
WBC: 15.5 10*3/uL — ABNORMAL HIGH (ref 4.0–10.5)
nRBC: 0 % (ref 0.0–0.2)

## 2019-10-20 LAB — MAGNESIUM: Magnesium: 2.5 mg/dL — ABNORMAL HIGH (ref 1.7–2.4)

## 2019-10-20 LAB — HEPARIN LEVEL (UNFRACTIONATED)
Heparin Unfractionated: 0.29 IU/mL — ABNORMAL LOW (ref 0.30–0.70)
Heparin Unfractionated: 0.29 IU/mL — ABNORMAL LOW (ref 0.30–0.70)
Heparin Unfractionated: 0.32 IU/mL (ref 0.30–0.70)

## 2019-10-20 LAB — BASIC METABOLIC PANEL
Anion gap: 7 (ref 5–15)
BUN: 17 mg/dL (ref 6–20)
CO2: 26 mmol/L (ref 22–32)
Calcium: 8.5 mg/dL — ABNORMAL LOW (ref 8.9–10.3)
Chloride: 109 mmol/L (ref 98–111)
Creatinine, Ser: 0.71 mg/dL (ref 0.61–1.24)
GFR, Estimated: >60 mL/min (ref >60)
Glucose, Bld: 178 mg/dL — ABNORMAL HIGH (ref 70–99)
Potassium: 4.4 mmol/L (ref 3.5–5.1)
Sodium: 142 mmol/L (ref 135–145)

## 2019-10-20 LAB — GLUCOSE, CAPILLARY
Glucose-Capillary: 116 mg/dL — ABNORMAL HIGH (ref 70–99)
Glucose-Capillary: 127 mg/dL — ABNORMAL HIGH (ref 70–99)
Glucose-Capillary: 144 mg/dL — ABNORMAL HIGH (ref 70–99)
Glucose-Capillary: 156 mg/dL — ABNORMAL HIGH (ref 70–99)
Glucose-Capillary: 162 mg/dL — ABNORMAL HIGH (ref 70–99)
Glucose-Capillary: 164 mg/dL — ABNORMAL HIGH (ref 70–99)
Glucose-Capillary: 202 mg/dL — ABNORMAL HIGH (ref 70–99)

## 2019-10-20 LAB — URINE CULTURE: Culture: NO GROWTH

## 2019-10-20 LAB — TRIGLYCERIDES: Triglycerides: 84 mg/dL (ref <150)

## 2019-10-20 LAB — PHOSPHORUS: Phosphorus: 3.1 mg/dL (ref 2.5–4.6)

## 2019-10-20 MED ORDER — ROCURONIUM BROMIDE 50 MG/5ML IV SOLN
100.0000 mg | Freq: Once | INTRAVENOUS | Status: AC
  Filled 2019-10-20: qty 10

## 2019-10-20 MED ORDER — MIDAZOLAM HCL 2 MG/2ML IJ SOLN: 2.0000 mg | Freq: Once | INTRAMUSCULAR | Status: DC

## 2019-10-20 MED ORDER — ETOMIDATE 2 MG/ML IV SOLN: 20.0000 mg | Freq: Once | INTRAVENOUS | Status: AC

## 2019-10-20 MED ORDER — QUETIAPINE FUMARATE 50 MG PO TABS
50.0000 mg | ORAL_TABLET | Freq: Two times a day (BID) | ORAL | Status: DC
  Administered 2019-10-20 (×2): 50 mg
  Filled 2019-10-20 (×3): qty 1

## 2019-10-20 MED ORDER — ROCURONIUM BROMIDE 50 MG/5ML IV SOLN
80.0000 mg | Freq: Once | INTRAVENOUS | Status: AC
  Administered 2019-10-20: 80 mg via INTRAVENOUS
  Filled 2019-10-20: qty 8

## 2019-10-20 MED ORDER — BARICITINIB 2 MG PO TABS
4.0000 mg | ORAL_TABLET | Freq: Every day | ORAL | Status: DC
  Administered 2019-10-21: 4 mg
  Filled 2019-10-20: qty 2

## 2019-10-20 MED ORDER — ETOMIDATE 2 MG/ML IV SOLN
20.0000 mg | Freq: Once | INTRAVENOUS | Status: AC
  Administered 2019-10-20: 20 mg via INTRAVENOUS

## 2019-10-20 MED ORDER — MIDAZOLAM HCL 2 MG/2ML IJ SOLN
INTRAMUSCULAR | Status: AC
  Filled 2019-10-20: qty 2

## 2019-10-20 NOTE — Progress Notes (Signed)
ET tube replaced   Asked to see patient in regards to variable axial thyroid volumes. Patient's cuff is flat able to maintain pressure. Vision was made to replace 7.5 ET tube with 7.5 ET tube.  Anesthesia: 100 mcg fentanyl 20 mg, etomidate 100 mg rocuronium  With direct visualization using glide scope, a bougie was inserted into the ET tube and the ET tube was removed. A new ET tube with functioning cuff easily inserted over the bougie and was directly visualized going into the larynx. Cuff was inflated it was secured. Post ET tube placement vital signs are stable exhaled tidal volumes are closer to matching inhaled tidal volumes. Patient tolerated procedure well.

## 2019-10-20 NOTE — Progress Notes (Signed)
[place] for heparin Indication: chest pain/ACS  Allergies  Allergen Reactions  . Gabapentin Other (See Comments)    hallucinations    Patient Measurements: Height: 5\' 9"  (175.3 cm) Weight: 86.8 kg (191 lb 5.8 oz) IBW/kg (Calculated) : 70.7  Vital Signs: Temp: 98.1 F (36.7 C) (10/15 1924) Temp Source: Oral (10/15 1924) BP: 107/62 (10/15 2300) Pulse Rate: 83 (10/15 2300)  Labs: Recent Labs    10/19/19 0235 10/19/19 0235 10/19/19 0355 10/19/19 0355 10/19/19 0450 10/19/19 1200 10/19/19 1555 10/19/19 2200 10/20/19 0050  HGB 15.1   < > 12.6*   < >  --   --  11.6*  --  11.7*  HCT 49.0   < > 37.0*  --   --   --  34.0*  --  38.1*  PLT 437*  --   --   --   --   --   --   --  268  HEPARINUNFRC  --   --   --   --   --   --   --  0.29*  --   CREATININE 1.03  --   --   --   --   --   --   --  0.71  TROPONINIHS 23*  --   --   --  107* 184*  --   --   --    < > = values in this interval not displayed.    Estimated Creatinine Clearance: 109.8 mL/min (by C-G formula based on SCr of 0.71 mg/dL).  Assessment: 59 yo m presenting with COVID, now with rising troponin. On heparin per pharmacy.  Heparin level slightly subtherapeutic (0.29) on gtt at 1000 units/hr. No issues with line or bleeding reported per RN.  Goal of Therapy:  Heparin level 0.3-0.7 units/ml Monitor platelets by anticoagulation protocol: Yes   Plan:  Increase heparin gtt to 1150 units/hr F/u 6 hr heparin level  Sherlon Handing, PharmD, BCPS Please see amion for complete clinical pharmacist phone list 10/20/2019 1:36 AM

## 2019-10-20 NOTE — H&P (Signed)
NAME:  James [name], MRN:  [id number], DOB:  04-24-1960, LOS: 1 ADMISSION DATE:  10/19/2019, CONSULTATION DATE:  10/19/19 REFERRING MD:  Betsey Holiday  CHIEF COMPLAINT:  Dyspnea   Brief History   James [name] is a 59 y.o. male who was admitted 10/15 with dyspnea.  He required intubation in ED given no improvement with CPAP.  COVID later returned positive.  History of present illness   Pt is encephelopathic; therefore, this HPI is obtained from chart review. James [name] is a 59 y.o. male who has a PMH as outlined below.  He presented to [facility] ED 10/15 with significant dyspnea to the point that he could not speak in full sentences.  He was placed on CPAP but did not respond; therefore, he was intubated.  After intubation, COVID returned positive.  He has been evaluated by pulmonary on prior admissions given recurrent PNA.  He has bronchoscopy in Aug 2020 which showed severe bronchiectasis bilaterally, neutrophil predominance and transbronchial biopsies were benign.  Past Medical History  has Acute on chronic respiratory failure with hypoxia ([place]); COPD mixed type ([place]); History of tobacco abuse; Alcohol use disorder, moderate, dependence ([place]); Low back pain radiating to both legs; Hypertension; COPD exacerbation ([place]); Marijuana abuse; Allergy to mold; Chronic respiratory failure with hypoxia ([place]); Intellectual disability; Other atopic dermatitis; Herpes simplex disease; Type 2 diabetes mellitus without complication, without long-term current use of insulin ([place]); Bronchiectasis ([place]); Pneumonia due to COVID-19 virus; and Acute encephalopathy on their problem list.  Significant Hospital Events   10/15 > admit.  Consults:  None.  Procedures:  ETT 10/15 >   Significant Diagnostic Tests:  CXR 10/15 > neg.  Micro Data:  Flu 10/15 > neg COVID 10/15 > positive Blood 10/15 >  Sputum 10/15 >   Antimicrobials:  Remdesivir 10/15 (5 days) >  Baricitinib 10/15 (14 days) >    Interim  history/subjective:  Sedated on vent.  Objective:  Blood pressure 131/67, pulse 93, temperature 98.4 F (36.9 C), temperature source Core, resp. rate 17, height 5' 9"  (1.753 m), weight 86.8 kg, SpO2 90 %.    Vent Mode: PRVC FiO2 (%):  [30 %-40 %] 30 % Set Rate:  [22 bmp] 22 bmp Vt Set:  [560 mL] 560 mL PEEP:  [5 cmH20] 5 cmH20 Plateau Pressure:  [17 cmH20-19 cmH20] 19 cmH20   Intake/Output Summary (Last 24 hours) at 10/20/2019 1708 Last data filed at 10/20/2019 1526 Gross per 24 hour  Intake 5804.11 ml  Output 1110 ml  Net 4694.11 ml   Filed Weights   10/19/19 0200 10/20/19 0500  Weight: 86.8 kg 86.8 kg    Examination: General: Adult male, chronically ill appearing, in NAD. Neuro: Sedated on vent.  Moves all when stimulated.  Not following commands. HEENT: [place]/AT. Sclerae anicteric.  ETT in place. Cardiovascular: RRR, no M/R/G.  Lungs: Respirations even and unlabored.  CTA bilaterally, tolerating PSV Abdomen: BS x 4, soft, NT/ND.  Musculoskeletal: No gross deformities, no edema.  Skin: Facial erythema, warm, no rashes.  Assessment & Plan:   Critically ill due to acute hypoxic respiratory failure due to Covid in 19 pneumonia -Improving oxygenation.  Daily SBT. -Sedation has now been weaned and the patient is following commands. -May be ready for extubation tomorrow.  COVID PNA - s/p intubation in ED. - Start solumedrol, Baricitinib, Remdesivir.  COPD exacerbation - 2/2 above. - Empiric ceftriaxone x 5 days. - BD's.  Best Practice:  Diet: NPO.  Tube feeds. Pain/Anxiety/Delirium protocol (if  indicated): Fentanyl infusion only  VAP protocol (if indicated): In place. DVT prophylaxis: SCD's / Heparin. GI prophylaxis: PPI. Glucose control: SSI.  Adequate glycemic control. Mobility: Bedrest. Code Status: Full. Family Communication: None available. Disposition: ICU.  Labs   CBC: Recent Labs  Lab 10/19/19 0235 10/19/19 0355 10/19/19 1555 10/20/19 0050  10/20/19 0642  WBC 22.4*  --   --  15.5*  --   NEUTROABS 15.0*  --   --   --   --   HGB 15.1 12.6* 11.6* 11.7* 11.6*  HCT 49.0 37.0* 34.0* 38.1* 34.0*  MCV 100.8*  --   --  101.3*  --   PLT 437*  --   --  268  --    Basic Metabolic Panel: Recent Labs  Lab 10/19/19 0235 10/19/19 0355 10/19/19 1555 10/20/19 0050 10/20/19 0642  NA 141 141 142 142 142  K 5.1 3.6 4.4 4.4 4.7  CL 101  --   --  109  --   CO2 27  --   --  26  --   GLUCOSE 175*  --   --  178*  --   BUN 14  --   --  17  --   CREATININE 1.03  --   --  0.71  --   CALCIUM 9.8  --   --  8.5*  --   MG  --   --   --  2.5*  --   PHOS  --   --   --  3.1  --    GFR: Estimated Creatinine Clearance: 109.8 mL/min (by C-G formula based on SCr of 0.71 mg/dL). Recent Labs  Lab 10/19/19 0235 10/20/19 0050  WBC 22.4* 15.5*   Liver Function Tests: Recent Labs  Lab 10/19/19 0235 10/20/19 0050  AST 29 18  ALT 27 22  ALKPHOS 52 36*  BILITOT 0.4 0.7  PROT 6.5 5.0*  ALBUMIN 3.9 2.8*   No results for input(s): LIPASE, AMYLASE in the last 168 hours. No results for input(s): AMMONIA in the last 168 hours. ABG    Component Value Date/Time   PHART 7.416 10/20/2019 0642   PCO2ART 48.6 (H) 10/20/2019 0642   PO2ART 76 (L) 10/20/2019 0642   HCO3 31.3 (H) 10/20/2019 0642   TCO2 33 (H) 10/20/2019 0642   O2SAT 95.0 10/20/2019 0642    Coagulation Profile: No results for input(s): INR, PROTIME in the last 168 hours. Cardiac Enzymes: No results for input(s): CKTOTAL, CKMB, CKMBINDEX, TROPONINI in the last 168 hours. HbA1C: Hgb A1c MFr Bld  Date/Time Value Ref Range Status  09/22/2019 03:55 AM 6.9 (H) 4.8 - 5.6 % Final    Comment:    (NOTE) Pre diabetes:          5.7%-6.4%  Diabetes:              >6.4%  Glycemic control for   <7.0% adults with diabetes   06/22/2019 03:13 AM 6.2 (H) 4.8 - 5.6 % Final    Comment:    (NOTE)         Prediabetes: 5.7 - 6.4         Diabetes: >6.4         Glycemic control for adults with  diabetes: <7.0    CBG: Recent Labs  Lab 10/20/19 0003 10/20/19 0310 10/20/19 0727 10/20/19 1131 10/20/19 1503  GLUCAP 127* 116* 164* 144* 156*    Review of Systems:   Unable to obtain as pt is encephalopathic.  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   Surgical History    Past Surgical History:  Procedure Laterality Date  . VIDEO ASSISTED THORACOSCOPY (VATS)/THOROCOTOMY Left 06/11/2013   Procedure: VIDEO ASSISTED THORACOSCOPY (VATS)/THOROCOTOMY;  Surgeon: Ivin Poot, MD;  Location: [place];  Service: Thoracic;  Laterality: Left;  [name] VIDEO BRONCHOSCOPY N/A 06/11/2013   Procedure: VIDEO BRONCHOSCOPY;  Surgeon: Ivin Poot, MD;  Location: [place];  Service: Thoracic;  Laterality: N/A;  . VIDEO BRONCHOSCOPY N/A 08/10/2018   Procedure: VIDEO BRONCHOSCOPY WITH FLUORO;  Surgeon: Candee Furbish, MD;  Location: [facility] ENDOSCOPY;  Service: Endoscopy;  Laterality: N/A;     Social History   reports that he quit smoking about 7 years ago. His smoking use included cigarettes. He has a 60.00 pack-year smoking history. His smokeless tobacco use includes chew. He reports current alcohol use of about 28.0 standard drinks of alcohol per week. He reports current drug use. Drug: Marijuana.   Family history   His family history includes COPD in his sister; Heart disease in his father.   Allergies Allergies  Allergen Reactions  . Gabapentin Other (See Comments)    hallucinations     Home meds  Prior to Admission medications   Medication Sig Start Date End Date Taking? Authorizing Provider  Accu-Chek  Softclix Lancets lancets Use as instructed to check blood sugar once daily. 08/15/19   [name], MD  albuterol (PROAIR HFA) 108 (90 Base) MCG/ACT inhaler Inhale 2 puffs into the lungs every 6 (six) hours as needed for wheezing or shortness of breath. 08/23/19   [name], MD  albuterol (PROVENTIL) (2.5 MG/3ML) 0.083% nebulizer solution Take 3 mLs (2.5 mg total) by nebulization every 6 (six) hours as needed for wheezing or shortness of breath. 10/02/19   [name], MD  Blood Glucose Monitoring Suppl (ACCU-CHEK GUIDE ME) w/Device KIT USE TO MEASURE BLOOD SUGAR TWICE A DAY 08/15/19   [provider]  Blood Glucose Monitoring Suppl (TRUE METRIX METER) w/Device KIT Use to measure blood sugar twice a day 08/14/19   [name], MD  folic acid (FOLVITE) 1 MG tablet Take 1 tablet (1 mg total) by mouth daily. 10/02/19   [name], MD  furosemide (LASIX) 20 MG tablet Take 1 tablet (20 mg total) by mouth daily as needed for edema (lower extremity). 10/05/19   [name], MD  glucose blood (TRUE METRIX BLOOD GLUCOSE TEST) test strip Use as instructed 08/14/19   [name], MD  losartan (COZAAR) 100 MG tablet Take 1 tablet (100 mg total) by mouth daily. 10/02/19   [name], MD  metFORMIN (GLUCOPHAGE) 500 MG tablet Take 1 tablet (500 mg total) by mouth 2 (two) times daily with a meal. 10/02/19 12/01/19  [name], MD  metoprolol succinate (TOPROL-XL) 25 MG 24 hr tablet Take 1 tablet (25 mg total) by mouth daily. 07/23/19   [name], MD  mometasone-formoterol (DULERA) 200-5 MCG/ACT AERO Inhale 2 puffs into the lungs 2 (two) times daily. 10/02/19   [name], MD  pantoprazole (PROTONIX) 40 MG tablet Take 1 tablet (40 mg total) by mouth daily. 10/02/19   Asencion Noble  E, MD  predniSONE (DELTASONE) 20 MG tablet Take 1 tablet (20 mg total) by mouth daily with breakfast. 10/02/19   [name], MD  Respiratory Therapy Supplies  (FLUTTER) DEVI Use 4 times daily 04/27/18   [name], MD  thiamine (VITAMIN B-1) 100 MG tablet Take 1 tablet (100 mg total) by mouth daily. 10/02/19   [name], MD  Tiotropium Bromide Monohydrate (SPIRIVA RESPIMAT) 2.5 MCG/ACT AERS Two puff daily 08/23/19   [name], MD    CRITICAL CARE Performed by: Kipp Brood   Total critical care time: 35 minutes  Critical care time was exclusive of separately billable procedures and treating other patients.  Critical care was necessary to treat or prevent imminent or life-threatening deterioration.  Critical care was time spent personally by me on the following activities: development of treatment plan with patient and/or surrogate as well as nursing, discussions with consultants, evaluation of patient's response to treatment, examination of patient, obtaining history from patient or surrogate, ordering and performing treatments and interventions, ordering and review of laboratory studies, ordering and review of radiographic studies, pulse oximetry, re-evaluation of patient's condition and participation in multidisciplinary rounds.  Kipp Brood, MD [facility] ICU Physician [place]  Pager: [phone] Mobile: [phone] After hours: [phone].   10/20/2019, 5:08 PM

## 2019-10-20 NOTE — Progress Notes (Signed)
[place] Progress Note Patient Name: James [name] DOB: August 16, 1960 MRN: [id number]   Date of Service  10/20/2019  HPI/Events of Note  RT reports that ETT cuff not holding air.   eICU Interventions  Plan: 1. Will ask ground team to evaluate ETT at bedside for possible exchange or replacement.      Intervention Category Major Interventions: Respiratory failure - evaluation and management  Makahla Kiser Eugene 10/20/2019, 8:49 PM

## 2019-10-20 NOTE — Progress Notes (Addendum)
James Masters, MD at bedside to exchange ETT d/t cuff leak. RT at bedside to assist. This RN administered medications per MD verbal orders as follows: 100 mcg fentanyl, 20 mg of Etomidate, and 80 mg of Rocuronium. Propofol was also increased to 40 mcg during procedure per MD verbal order. See MAR for complete record. Placement verified with x-ray. VSS during procedure and post placement.

## 2019-10-20 NOTE — Progress Notes (Signed)
[place] for heparin Indication: chest pain/ACS  Allergies  Allergen Reactions  . Gabapentin Other (See Comments)    hallucinations    Patient Measurements: Height: 5\' 9"  (175.3 cm) Weight: 86.8 kg (191 lb 5.8 oz) IBW/kg (Calculated) : 70.7  Vital Signs: Temp: 99.3 F (37.4 C) (10/16 1900) Temp Source: Core (10/16 1500) BP: 129/73 (10/16 1900) Pulse Rate: 87 (10/16 1900)  Labs: Recent Labs    10/19/19 0235 10/19/19 0355 10/19/19 0450 10/19/19 1200 10/19/19 1555 10/19/19 1555 10/19/19 2200 10/20/19 0050 10/20/19 0642 10/20/19 0743 10/20/19 1811  HGB 15.1   < >  --   --  11.6*   < >  --  11.7* 11.6*  --   --   HCT 49.0   < >  --   --  34.0*  --   --  38.1* 34.0*  --   --   PLT 437*  --   --   --   --   --   --  268  --   --   --   HEPARINUNFRC  --   --   --   --   --   --  0.29*  --   --  0.29* 0.32  CREATININE 1.03  --   --   --   --   --   --  0.71  --   --   --   TROPONINIHS 23*  --  107* 184*  --   --   --   --   --   --   --    < > = values in this interval not displayed.    Estimated Creatinine Clearance: 109.8 mL/min (by C-G formula based on SCr of 0.71 mg/dL).  Assessment: 59 yo m presenting with COVID, now with rising troponin. On heparin per pharmacy.  Heparin level low end therapeutic on 1300 units/hr  Goal of Therapy:  Heparin level 0.3-0.7 units/ml Monitor platelets by anticoagulation protocol: Yes   Plan:  Increase heparin gtt slightly to 1350 units/hr F/u heparin level with AM labs  Bertis Ruddy, PharmD Clinical Pharmacist ED Pharmacist Phone # [phone] 10/20/2019 7:11 PM

## 2019-10-20 NOTE — Progress Notes (Signed)
Assisted tele visit to patient with family member.  Carola Viramontes Harold, RN  

## 2019-10-20 NOTE — Therapy (Addendum)
5am ABG results on current vent settings:  PH 7.41 PCO2 48.6 PO2 76 BE 6 HCO3 31.3 TCO2 33 sO2 95%

## 2019-10-20 NOTE — Progress Notes (Signed)
[place] for heparin Indication: chest pain/ACS  Allergies  Allergen Reactions  . Gabapentin Other (See Comments)    hallucinations    Patient Measurements: Height: 5\' 9"  (175.3 cm) Weight: 86.8 kg (191 lb 5.8 oz) IBW/kg (Calculated) : 70.7  Vital Signs: Temp: 97.2 F (36.2 C) (10/16 0800) BP: 104/64 (10/16 0800) Pulse Rate: 75 (10/16 0800)  Labs: Recent Labs    10/19/19 0235 10/19/19 0355 10/19/19 0450 10/19/19 1200 10/19/19 1555 10/19/19 1555 10/19/19 2200 10/20/19 0050 10/20/19 0642 10/20/19 0743  HGB 15.1   < >  --   --  11.6*   < >  --  11.7* 11.6*  --   HCT 49.0   < >  --   --  34.0*  --   --  38.1* 34.0*  --   PLT 437*  --   --   --   --   --   --  268  --   --   HEPARINUNFRC  --   --   --   --   --   --  0.29*  --   --  0.29*  CREATININE 1.03  --   --   --   --   --   --  0.71  --   --   TROPONINIHS 23*  --  107* 184*  --   --   --   --   --   --    < > = values in this interval not displayed.    Estimated Creatinine Clearance: 109.8 mL/min (by C-G formula based on SCr of 0.71 mg/dL).  Assessment: 59 yo m presenting with COVID, now with rising troponin. On heparin per pharmacy.  Heparin level slightly subtherapeutic (0.29) on gtt at 1150 units/hr. No issues with line or bleeding reported per RN.  Goal of Therapy:  Heparin level 0.3-0.7 units/ml Monitor platelets by anticoagulation protocol: Yes   Plan:  Increase heparin gtt to 1300 units/hr F/u 6 hr heparin level  Alanda Slim, PharmD, [facility] Clinical Pharmacist Please see AMION for all Pharmacists' Contact Phone Numbers 10/20/2019, 9:35 AM

## 2019-10-21 ENCOUNTER — Encounter (HOSPITAL_COMMUNITY): Payer: Self-pay | Admitting: Pulmonary Disease

## 2019-10-21 ENCOUNTER — Inpatient Hospital Stay (HOSPITAL_COMMUNITY): Payer: Medicaid Other

## 2019-10-21 DIAGNOSIS — U071 COVID-19: Secondary | ICD-10-CM | POA: Diagnosis not present

## 2019-10-21 LAB — CBC
HCT: 35.8 % — ABNORMAL LOW (ref 39.0–52.0)
HCT: 40.8 % (ref 39.0–52.0)
Hemoglobin: 11 g/dL — ABNORMAL LOW (ref 13.0–17.0)
Hemoglobin: 13 g/dL (ref 13.0–17.0)
MCH: 31.7 pg (ref 26.0–34.0)
MCH: 32 pg (ref 26.0–34.0)
MCHC: 30.7 g/dL (ref 30.0–36.0)
MCHC: 31.9 g/dL (ref 30.0–36.0)
MCV: 103.2 fL — ABNORMAL HIGH (ref 80.0–100.0)
MCV: 100.5 fL — ABNORMAL HIGH (ref 80.0–100.0)
Platelets: 272 10*3/uL (ref 150–400)
Platelets: 286 10*3/uL (ref 150–400)
RBC: 3.47 MIL/uL — ABNORMAL LOW (ref 4.22–5.81)
RBC: 4.06 MIL/uL — ABNORMAL LOW (ref 4.22–5.81)
RDW: 13 % (ref 11.5–15.5)
RDW: 12.9 % (ref 11.5–15.5)
WBC: 20.5 10*3/uL — ABNORMAL HIGH (ref 4.0–10.5)
WBC: 23.8 10*3/uL — ABNORMAL HIGH (ref 4.0–10.5)
nRBC: 0 % (ref 0.0–0.2)
nRBC: 0 % (ref 0.0–0.2)

## 2019-10-21 LAB — GLUCOSE, CAPILLARY
Glucose-Capillary: 124 mg/dL — ABNORMAL HIGH (ref 70–99)
Glucose-Capillary: 125 mg/dL — ABNORMAL HIGH (ref 70–99)
Glucose-Capillary: 191 mg/dL — ABNORMAL HIGH (ref 70–99)
Glucose-Capillary: 128 mg/dL — ABNORMAL HIGH (ref 70–99)
Glucose-Capillary: 97 mg/dL (ref 70–99)
Glucose-Capillary: 176 mg/dL — ABNORMAL HIGH (ref 70–99)

## 2019-10-21 LAB — CREATININE, SERUM
Creatinine, Ser: 0.71 mg/dL (ref 0.61–1.24)
GFR, Estimated: >60 mL/min (ref >60)

## 2019-10-21 LAB — HEPARIN LEVEL (UNFRACTIONATED): Heparin Unfractionated: 0.32 IU/mL (ref 0.30–0.70)

## 2019-10-21 LAB — TRIGLYCERIDES: Triglycerides: 95 mg/dL (ref <150)

## 2019-10-21 MED ORDER — QUETIAPINE FUMARATE 50 MG PO TABS
50.0000 mg | ORAL_TABLET | Freq: Two times a day (BID) | ORAL | Status: DC
  Administered 2019-10-21 – 2019-10-22 (×2): 50 mg via ORAL
  Filled 2019-10-21 (×2): qty 1

## 2019-10-21 MED ORDER — DOCUSATE SODIUM 50 MG/5ML PO LIQD: 100.0000 mg | Freq: Two times a day (BID) | ORAL | Status: DC | PRN

## 2019-10-21 MED ORDER — FUROSEMIDE 10 MG/ML IJ SOLN
40.0000 mg | Freq: Once | INTRAMUSCULAR | Status: AC
  Administered 2019-10-21: 40 mg via INTRAVENOUS
  Filled 2019-10-21: qty 4

## 2019-10-21 MED ORDER — DOCUSATE SODIUM 50 MG/5ML PO LIQD
100.0000 mg | Freq: Two times a day (BID) | ORAL | Status: DC
  Administered 2019-10-21: 100 mg via ORAL
  Filled 2019-10-21 (×2): qty 10

## 2019-10-21 MED ORDER — POLYETHYLENE GLYCOL 3350 17 G PO PACK
17.0000 g | PACK | Freq: Every day | ORAL | Status: DC
  Administered 2019-10-23: 17 g via ORAL
  Filled 2019-10-21 (×2): qty 1

## 2019-10-21 MED ORDER — PREDNISONE 20 MG PO TABS
50.0000 mg | ORAL_TABLET | Freq: Every day | ORAL | Status: DC
  Administered 2019-10-22 – 2019-10-26 (×5): 50 mg via ORAL
  Filled 2019-10-21: qty 1
  Filled 2019-10-21 (×4): qty 2

## 2019-10-21 MED ORDER — POLYETHYLENE GLYCOL 3350 17 G PO PACK: 17.0000 g | PACK | Freq: Every day | ORAL | Status: DC | PRN

## 2019-10-21 MED ORDER — BARICITINIB 2 MG PO TABS
4.0000 mg | ORAL_TABLET | Freq: Every day | ORAL | Status: DC
  Administered 2019-10-22 – 2019-10-26 (×5): 4 mg via ORAL
  Filled 2019-10-21 (×5): qty 2

## 2019-10-21 MED ORDER — ENOXAPARIN SODIUM 40 MG/0.4ML ~~LOC~~ SOLN
40.0000 mg | SUBCUTANEOUS | Status: DC
  Administered 2019-10-21 – 2019-10-26 (×6): 40 mg via SUBCUTANEOUS
  Filled 2019-10-21 (×6): qty 0.4

## 2019-10-21 MED ORDER — ALBUTEROL SULFATE HFA 108 (90 BASE) MCG/ACT IN AERS
1.0000 | INHALATION_SPRAY | RESPIRATORY_TRACT | Status: DC | PRN
  Filled 2019-10-21: qty 6.7

## 2019-10-21 MED ORDER — ASCORBIC ACID 500 MG PO TABS
500.0000 mg | ORAL_TABLET | Freq: Every day | ORAL | Status: DC
  Administered 2019-10-22 – 2019-10-26 (×5): 500 mg via ORAL
  Filled 2019-10-21 (×5): qty 1

## 2019-10-21 MED ORDER — ZINC SULFATE 220 (50 ZN) MG PO CAPS
220.0000 mg | ORAL_CAPSULE | Freq: Every day | ORAL | Status: DC
  Administered 2019-10-22 – 2019-10-26 (×5): 220 mg via ORAL
  Filled 2019-10-21 (×5): qty 1

## 2019-10-21 NOTE — Progress Notes (Addendum)
NAME:  James [name], MRN:  [id number], DOB:  13-May-1960, LOS: 2 ADMISSION DATE:  10/19/2019, CONSULTATION DATE:  10/19/19 REFERRING MD:  Betsey Holiday  CHIEF COMPLAINT:  Dyspnea   Brief History   James [name] is a 59 y.o. male who was admitted 10/15 with dyspnea.  He required intubation in ED given no improvement with CPAP.  COVID later returned positive.  History of present illness   Pt is encephelopathic; therefore, this HPI is obtained from chart review. James [name] is a 59 y.o. male who has a PMH as outlined below.  He presented to [facility] ED 10/15 with significant dyspnea to the point that he could not speak in full sentences.  He was placed on CPAP but did not respond; therefore, he was intubated.  After intubation, COVID returned positive.  He has been evaluated by pulmonary on prior admissions given recurrent PNA.  He has bronchoscopy in Aug 2020 which showed severe bronchiectasis bilaterally, neutrophil predominance and transbronchial biopsies were benign.  Past Medical History   Past Medical History:  Diagnosis Date  . Acute on chronic respiratory failure with hypoxia ([place]) 06/08/2013  . CAP (community acquired pneumonia) 05/17/2018   See admit 05/17/18 ? rml  with   covid pcr neg - rx augmentin > f/u cxr in 4-6 weeks is fine unless condition declines   . Community acquired pneumonia of right lower lobe of lung 05/17/2018   See admit 05/17/18 ? rml  with   covid pcr neg - rx augmentin > f/u cxr in 4-6 weeks is fine unless condition declines   . COPD (chronic obstructive pulmonary disease) ([place])    not on home  . Diabetes ([place])   . Dyspnea   . Hypertension   . Pneumonia 04/06/2016  . Pneumothorax    2016, fell from ladder  . 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 Tick bite 06/03/2018    Significant Hospital Events   10/15 > admit.  Consults:  None.  Procedures:  ETT 10/15 >   Significant Diagnostic Tests:  CXR 10/15 > neg.  Micro Data:  Flu 10/15 > neg COVID 10/15 >  positive Blood 10/15 >  Sputum 10/15 >   Antimicrobials:  Remdesivir 10/15 (5 days) >  Baricitinib 10/15 (14 days) >    Interim history/subjective:  Required tube exchange overnight. Currently on PSV wean. Continues to have copious secretions (background of bronchiectasis).   Objective:  Blood pressure (!) 145/83, pulse 86, temperature 99 F (37.2 C), resp. rate 13, height 5\' 9"  (1.753 m), weight 86.8 kg, SpO2 (!) 85 %.    Vent Mode: PRVC FiO2 (%):  [30 %] 30 % Set Rate:  [22 bmp] 22 bmp Vt Set:  [560 mL] 560 mL PEEP:  [5 cmH20] 5 cmH20 Plateau Pressure:  [17 cmH20-24 cmH20] 18 cmH20   Intake/Output Summary (Last 24 hours) at 10/21/2019 0926 Last data filed at 10/21/2019 0800 Gross per 24 hour  Intake 1153.24 ml  Output 2365 ml  Net -1211.76 ml   Filed Weights   10/19/19 0200 10/20/19 0500  Weight: 86.8 kg 86.8 kg    Examination: General: Adult male, chronically ill appearing,obese,. Neuro: Awake following commands on low-dose sedation.  HEENT: [place]/AT. Sclerae anicteric.  ETT in place. Cardiovascular: RRR, no M/R/G.  Lungs: coughing with wheezing, however, tolerating PSV Abdomen: BS x 4, soft, NT/ND.  Musculoskeletal: No gross deformities, no edema.  Skin: Facial erythema, warm, no rashes.  Assessment & Plan:   Critically ill due to acute  hypoxic respiratory failure due to Covid in 19 pneumonia -Extubate today if passes 1h SBT, possibly to [place]. -Tolerate SpO2 in upper 80's given history of underlying lung disease.  - Avoid BiPAP given copious secretions.  - Stop IV heparin given negative D-dimer.   COVID PNA - s/p intubation in ED. - Start solumedrol, Baricitinib, Remdesivir.  COPD exacerbation - 2/2 above. - Empiric ceftriaxone x 5 days. - Continue current bronchodilators - Steroid taper.   Best Practice:  Diet: NPO.  Tube feeds. - hold pending extubation.  Pain/Anxiety/Delirium protocol (if indicated): Fentanyl infusion only  VAP protocol (if  indicated): In place. DVT prophylaxis: SCD's / Heparin. GI prophylaxis: PPI. Glucose control: SSI.  Adequate glycemic control. Mobility: Bedrest. Code Status: Full. Family Communication: None available. Disposition: ICU.  Labs   CBC: Recent Labs  Lab 10/19/19 0235 10/19/19 0235 10/19/19 0355 10/19/19 1555 10/20/19 0050 10/20/19 0642 10/21/19 0256  WBC 22.4*  --   --   --  15.5*  --  20.5*  NEUTROABS 15.0*  --   --   --   --   --   --   HGB 15.1   < > 12.6* 11.6* 11.7* 11.6* 11.0*  HCT 49.0   < > 37.0* 34.0* 38.1* 34.0* 35.8*  MCV 100.8*  --   --   --  101.3*  --  103.2*  PLT 437*  --   --   --  268  --  272   < > = values in this interval not displayed.   Basic Metabolic Panel: Recent Labs  Lab 10/19/19 0235 10/19/19 0355 10/19/19 1555 10/20/19 0050 10/20/19 0642  NA 141 141 142 142 142  K 5.1 3.6 4.4 4.4 4.7  CL 101  --   --  109  --   CO2 27  --   --  26  --   GLUCOSE 175*  --   --  178*  --   BUN 14  --   --  17  --   CREATININE 1.03  --   --  0.71  --   CALCIUM 9.8  --   --  8.5*  --   MG  --   --   --  2.5*  --   PHOS  --   --   --  3.1  --    GFR: Estimated Creatinine Clearance: 109.8 mL/min (by C-G formula based on SCr of 0.71 mg/dL). Recent Labs  Lab 10/19/19 0235 10/20/19 0050 10/21/19 0256  WBC 22.4* 15.5* 20.5*   Liver Function Tests: Recent Labs  Lab 10/19/19 0235 10/20/19 0050  AST 29 18  ALT 27 22  ALKPHOS 52 36*  BILITOT 0.4 0.7  PROT 6.5 5.0*  ALBUMIN 3.9 2.8*   No results for input(s): LIPASE, AMYLASE in the last 168 hours. No results for input(s): AMMONIA in the last 168 hours. ABG    Component Value Date/Time   PHART 7.416 10/20/2019 0642   PCO2ART 48.6 (H) 10/20/2019 0642   PO2ART 76 (L) 10/20/2019 0642   HCO3 31.3 (H) 10/20/2019 0642   TCO2 33 (H) 10/20/2019 0642   O2SAT 95.0 10/20/2019 0642    Coagulation Profile: No results for input(s): INR, PROTIME in the last 168 hours. Cardiac Enzymes: No results for  input(s): CKTOTAL, CKMB, CKMBINDEX, TROPONINI in the last 168 hours. HbA1C: Hgb A1c MFr Bld  Date/Time Value Ref Range Status  09/22/2019 03:55 AM 6.9 (H) 4.8 - 5.6 % Final  Comment:    (NOTE) Pre diabetes:          5.7%-6.4%  Diabetes:              >6.4%  Glycemic control for   <7.0% adults with diabetes   06/22/2019 03:13 AM 6.2 (H) 4.8 - 5.6 % Final    Comment:    (NOTE)         Prediabetes: 5.7 - 6.4         Diabetes: >6.4         Glycemic control for adults with diabetes: <7.0    CBG: Recent Labs  Lab 10/20/19 1503 10/20/19 1957 10/20/19 2345 10/21/19 0348 10/21/19 0744  GLUCAP 156* 162* 202* 124* 125*    CRITICAL CARE Performed by: Kipp Brood   Total critical care time: 40 minutes  Critical care time was exclusive of separately billable procedures and treating other patients.  Critical care was necessary to treat or prevent imminent or life-threatening deterioration.  Critical care was time spent personally by me on the following activities: development of treatment plan with patient and/or surrogate as well as nursing, discussions with consultants, evaluation of patient's response to treatment, examination of patient, obtaining history from patient or surrogate, ordering and performing treatments and interventions, ordering and review of laboratory studies, ordering and review of radiographic studies, pulse oximetry, re-evaluation of patient's condition and participation in multidisciplinary rounds.  Kipp Brood, MD [facility] ICU Physician [place]  Pager: [phone] Mobile: [phone] After hours: [phone].   10/21/2019, 9:26 AM

## 2019-10-21 NOTE — Procedures (Signed)
Extubation Procedure Note  Patient Details:   Name: James [name] DOB: Jan 07, 1960 MRN: [id number]   Airway Documentation:    Vent end date: 10/21/19 Vent end time: 1100   Evaluation  O2 sats: stable throughout Complications: No apparent complications Patient did tolerate procedure well. Bilateral Breath Sounds: Diminished   Yes   Pt extubated to 6L HFNC per MD order. Pt stable throughout with no complications. Positive cuff leak noted prior to extubation. Pt able to speak and has a strong productive cough post extubation. Pt encouraged to use [place] to clear secretions.   Jesse Sans 10/21/2019, 12:06 PM

## 2019-10-21 NOTE — Progress Notes (Signed)
Assisted tele visit to patient with family member.  Amillia Biffle Harold, RN  

## 2019-10-21 NOTE — Progress Notes (Signed)
[place] for heparin Indication: chest pain/ACS  Allergies  Allergen Reactions  . Gabapentin Other (See Comments)    hallucinations    Patient Measurements: Height: 5\' 9"  (175.3 cm) Weight: 86.8 kg (191 lb 5.8 oz) IBW/kg (Calculated) : 70.7  Vital Signs: Temp: 98.2 F (36.8 C) (10/17 0630) Temp Source: Core (10/17 0400) BP: 125/70 (10/17 0630) Pulse Rate: 72 (10/17 0630)  Labs: Recent Labs    10/19/19 0235 10/19/19 0355 10/19/19 0450 10/19/19 1200 10/19/19 1555 10/19/19 2200 10/20/19 0050 10/20/19 0050 10/20/19 0642 10/20/19 0743 10/20/19 1811 10/21/19 0256  HGB 15.1   < >  --   --    < >  --  11.7*   < > 11.6*  --   --  11.0*  HCT 49.0   < >  --   --    < >  --  38.1*  --  34.0*  --   --  35.8*  PLT 437*  --   --   --   --   --  268  --   --   --   --  272  HEPARINUNFRC  --   --   --   --    < >   < >  --   --   --  0.29* 0.32 0.32  CREATININE 1.03  --   --   --   --   --  0.71  --   --   --   --   --   TROPONINIHS 23*  --  107* 184*  --   --   --   --   --   --   --   --    < > = values in this interval not displayed.    Estimated Creatinine Clearance: 109.8 mL/min (by C-G formula based on SCr of 0.71 mg/dL).  Assessment: 59 yo m presenting with COVID, now with rising troponin. On heparin per pharmacy.  Heparin level remains at the low end of therapeutic on 1350 units/hr  Goal of Therapy:  Heparin level 0.3-0.7 units/ml Monitor platelets by anticoagulation protocol: Yes   Plan:  Continue heparin gtt at 1350 units/hr F/u heparin level with AM labs  Alanda Slim, PharmD, [place] Pharmacist Please see AMION for all Pharmacists' Contact Phone Numbers 10/21/2019, 7:08 AM

## 2019-10-21 NOTE — Progress Notes (Signed)
VASCULAR LAB    Bilateral lower extremity venous duplex has been performed.  See CV proc for preliminary results.   Pinkey Mcjunkin, RVT 10/21/2019, 12:40 PM

## 2019-10-22 ENCOUNTER — Other Ambulatory Visit: Payer: Self-pay

## 2019-10-22 ENCOUNTER — Telehealth: Payer: Self-pay | Admitting: Critical Care Medicine

## 2019-10-22 ENCOUNTER — Encounter (HOSPITAL_COMMUNITY): Payer: Self-pay | Admitting: Pulmonary Disease

## 2019-10-22 DIAGNOSIS — U071 COVID-19: Secondary | ICD-10-CM | POA: Diagnosis not present

## 2019-10-22 LAB — BASIC METABOLIC PANEL
Anion gap: 11 (ref 5–15)
BUN: 32 mg/dL — ABNORMAL HIGH (ref 6–20)
CO2: 30 mmol/L (ref 22–32)
Calcium: 9.4 mg/dL (ref 8.9–10.3)
Chloride: 101 mmol/L (ref 98–111)
Creatinine, Ser: 0.7 mg/dL (ref 0.61–1.24)
GFR, Estimated: >60 mL/min (ref >60)
Glucose, Bld: 145 mg/dL — ABNORMAL HIGH (ref 70–99)
Potassium: 4.9 mmol/L (ref 3.5–5.1)
Sodium: 142 mmol/L (ref 135–145)

## 2019-10-22 LAB — GLUCOSE, CAPILLARY
Glucose-Capillary: 114 mg/dL — ABNORMAL HIGH (ref 70–99)
Glucose-Capillary: 128 mg/dL — ABNORMAL HIGH (ref 70–99)
Glucose-Capillary: 130 mg/dL — ABNORMAL HIGH (ref 70–99)
Glucose-Capillary: 184 mg/dL — ABNORMAL HIGH (ref 70–99)
Glucose-Capillary: 191 mg/dL — ABNORMAL HIGH (ref 70–99)
Glucose-Capillary: 205 mg/dL — ABNORMAL HIGH (ref 70–99)
Glucose-Capillary: 218 mg/dL — ABNORMAL HIGH (ref 70–99)

## 2019-10-22 MED ORDER — PANTOPRAZOLE SODIUM 40 MG PO TBEC
40.0000 mg | DELAYED_RELEASE_TABLET | Freq: Every day | ORAL | Status: DC
  Administered 2019-10-22 – 2019-10-25 (×4): 40 mg via ORAL
  Filled 2019-10-22 (×5): qty 1

## 2019-10-22 MED ORDER — MOMETASONE FURO-FORMOTEROL FUM 200-5 MCG/ACT IN AERO
2.0000 | INHALATION_SPRAY | Freq: Two times a day (BID) | RESPIRATORY_TRACT | Status: DC
  Administered 2019-10-22 – 2019-10-26 (×9): 2 via RESPIRATORY_TRACT
  Filled 2019-10-22: qty 8.8

## 2019-10-22 MED ORDER — UMECLIDINIUM BROMIDE 62.5 MCG/INH IN AEPB
1.0000 | INHALATION_SPRAY | Freq: Every day | RESPIRATORY_TRACT | Status: DC
  Administered 2019-10-22 – 2019-10-26 (×5): 1 via RESPIRATORY_TRACT
  Filled 2019-10-22: qty 7

## 2019-10-22 MED ORDER — ADULT MULTIVITAMIN W/MINERALS CH
1.0000 | ORAL_TABLET | Freq: Every day | ORAL | Status: DC
  Administered 2019-10-22 – 2019-10-26 (×5): 1 via ORAL
  Filled 2019-10-22 (×5): qty 1

## 2019-10-22 MED ORDER — METOPROLOL SUCCINATE ER 25 MG PO TB24
25.0000 mg | ORAL_TABLET | Freq: Every day | ORAL | Status: DC
  Administered 2019-10-22 – 2019-10-26 (×5): 25 mg via ORAL
  Filled 2019-10-22 (×5): qty 1

## 2019-10-22 MED ORDER — TIOTROPIUM BROMIDE MONOHYDRATE 2.5 MCG/ACT IN AERS: 2.0000 | INHALATION_SPRAY | Freq: Every day | RESPIRATORY_TRACT | Status: DC

## 2019-10-22 MED ORDER — ENSURE ENLIVE PO LIQD
237.0000 mL | Freq: Three times a day (TID) | ORAL | Status: DC
  Administered 2019-10-22 – 2019-10-26 (×11): 237 mL via ORAL

## 2019-10-22 NOTE — Progress Notes (Signed)
Assisted tele visit to patient with family member.  Thomas, Shaneque Merkle Renee, RN   

## 2019-10-22 NOTE — Telephone Encounter (Signed)
Copied from [place] [id number]. Topic: General - Other >> Oct 22, 2019 11:16 AM Keene Breath wrote: Reason for CRM: Patient's relative called and would like the nurse or doctor to call her [name]) regarding patient who has been admitted to the hospital with Covid.  CB# [phone]

## 2019-10-22 NOTE — Progress Notes (Signed)
NAME:  James [name], MRN:  [id number], DOB:  [birth year], LOS: 3 ADMISSION DATE:  10/19/2019, CONSULTATION DATE:  10/19/19 REFERRING MD:  Betsey Holiday  CHIEF COMPLAINT:  Dyspnea   Brief History   James [name] is a 59 y.o. male who was admitted 10/15 with dyspnea.  He required intubation in ED given no improvement with CPAP.  COVID later returned positive.  History of present illness   Pt is encephelopathic; therefore, this HPI is obtained from chart review. James [name] is a 59 y.o. male who has a PMH as outlined below.  He presented to [facility] ED 10/15 with significant dyspnea to the point that he could not speak in full sentences.  He was placed on CPAP but did not respond; therefore, he was intubated.  After intubation, COVID returned positive.  He has been evaluated by pulmonary on prior admissions given recurrent PNA.  He has bronchoscopy in Aug 2020 which showed severe bronchiectasis bilaterally, neutrophil predominance and transbronchial biopsies were benign.  Past Medical History   Past Medical History:  Diagnosis Date  . Acute on chronic respiratory failure with hypoxia ([place]) 06/08/2013  . CAP (community acquired pneumonia) 05/17/2018   See admit 05/17/18 ? rml  with   covid pcr neg - rx augmentin > f/u cxr in 4-6 weeks is fine unless condition declines   . Community acquired pneumonia of right lower lobe of lung 05/17/2018   See admit 05/17/18 ? rml  with   covid pcr neg - rx augmentin > f/u cxr in 4-6 weeks is fine unless condition declines   . COPD (chronic obstructive pulmonary disease) ([place])    not on home  . Diabetes ([place])   . Dyspnea   . Hypertension   . Pneumonia 04/06/2016  . Pneumothorax    2016, fell from ladder  . 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 Tick bite 06/03/2018    Significant Hospital Events   10/15 > admit.  Consults:  None.  Procedures:  ETT 10/15 >   Significant Diagnostic Tests:  CXR 10/15 > neg.  Micro Data:  Flu 10/15 > neg COVID 10/15 >  positive Blood 10/15 >  Sputum 10/15 >   Antimicrobials:  Remdesivir 10/15 (5 days) >  Baricitinib 10/15 (14 days) >    Interim history/subjective:  Extubated yesterday.  No overnight events.  Objective:  Blood pressure (!) 151/83, pulse 84, temperature 98 F (36.7 C), resp. rate (!) 21, height 5\' 9"  (1.753 m), weight 82.3 kg, SpO2 98 %.    FiO2 (%):  [32 %] 32 %   Intake/Output Summary (Last 24 hours) at 10/22/2019 1020 Last data filed at 10/22/2019 0800 Gross per 24 hour  Intake 1258.91 ml  Output 5600 ml  Net -4341.09 ml   Filed Weights   10/19/19 0200 10/20/19 0500 10/22/19 0630  Weight: 86.8 kg 86.8 kg 82.3 kg    Examination: General: Adult male, chronically ill in no distress. Neuro: Awake following no focal deficit.  HEENT: [place]/AT.  Raspy voice Cardiovascular: RRR, no M/R/G.  Lungs: Chest is clear anteriorly breathing comfortably on nasal cannula with no accessory muscle use Abdomen: BS x 4, soft, NT/ND.  Musculoskeletal: No gross deformities, no edema.  Skin: Facial erythema, warm, no rashes.  Assessment & Plan:   Was critically ill due to acute hypoxic respiratory failure due to Covid in 19 pneumonia -Progressive ambulation in preparation for possible discharge -Transfer to PCU.  COVID PNA - s/p intubation in ED. -Continue solumedrol, Baricitinib, Remdesivir.  COPD exacerbation - 2/2 above. - Empiric ceftriaxone x 5 days. - Continue current bronchodilators - Steroid taper.   Best Practice:  Diet: Full diet Pain/Anxiety/Delirium protocol (if indicated): None VAP protocol (if indicated): No longer intubated DVT prophylaxis: SCD's / Heparin. GI prophylaxis: PPI. Glucose control: SSI.  Adequate glycemic control. Mobility: Bedrest. Code Status: Full. Family Communication: None available. Disposition: Transfer to PCU, order reconciliation performed and [facility] notified.  Labs   CBC: Recent Labs  Lab 10/19/19 0235 10/19/19 0355 10/19/19 1555  10/20/19 0050 10/20/19 0642 10/21/19 0256 10/21/19 1044  WBC 22.4*  --   --  15.5*  --  20.5* 23.8*  NEUTROABS 15.0*  --   --   --   --   --   --   HGB 15.1   < > 11.6* 11.7* 11.6* 11.0* 13.0  HCT 49.0   < > 34.0* 38.1* 34.0* 35.8* 40.8  MCV 100.8*  --   --  101.3*  --  103.2* 100.5*  PLT 437*  --   --  268  --  272 286   < > = values in this interval not displayed.   Basic Metabolic Panel: Recent Labs  Lab 10/19/19 0235 10/19/19 0235 10/19/19 0355 10/19/19 1555 10/20/19 0050 10/20/19 0642 10/21/19 1044 10/22/19 0206  NA 141   < > 141 142 142 142  --  142  K 5.1   < > 3.6 4.4 4.4 4.7  --  4.9  CL 101  --   --   --  109  --   --  101  CO2 27  --   --   --  26  --   --  30  GLUCOSE 175*  --   --   --  178*  --   --  145*  BUN 14  --   --   --  17  --   --  32*  CREATININE 1.03  --   --   --  0.71  --  0.71 0.70  CALCIUM 9.8  --   --   --  8.5*  --   --  9.4  MG  --   --   --   --  2.5*  --   --   --   PHOS  --   --   --   --  3.1  --   --   --    < > = values in this interval not displayed.   GFR: Estimated Creatinine Clearance: 100.6 mL/min (by C-G formula based on SCr of 0.7 mg/dL). Recent Labs  Lab 10/19/19 0235 10/20/19 0050 10/21/19 0256 10/21/19 1044  WBC 22.4* 15.5* 20.5* 23.8*   Liver Function Tests: Recent Labs  Lab 10/19/19 0235 10/20/19 0050  AST 29 18  ALT 27 22  ALKPHOS 52 36*  BILITOT 0.4 0.7  PROT 6.5 5.0*  ALBUMIN 3.9 2.8*   No results for input(s): LIPASE, AMYLASE in the last 168 hours. No results for input(s): AMMONIA in the last 168 hours. ABG    Component Value Date/Time   PHART 7.416 10/20/2019 0642   PCO2ART 48.6 (H) 10/20/2019 0642   PO2ART 76 (L) 10/20/2019 0642   HCO3 31.3 (H) 10/20/2019 0642   TCO2 33 (H) 10/20/2019 0642   O2SAT 95.0 10/20/2019 0642    Coagulation Profile: No results for input(s): INR, PROTIME in the last 168 hours. Cardiac Enzymes: No results for input(s): CKTOTAL, CKMB, CKMBINDEX, TROPONINI in the last  168 hours. HbA1C: Hgb A1c MFr Bld  Date/Time Value Ref Range Status  09/22/2019 03:55 AM 6.9 (H) 4.8 - 5.6 % Final    Comment:    (NOTE) Pre diabetes:          5.7%-6.4%  Diabetes:              >6.4%  Glycemic control for   <7.0% adults with diabetes   06/22/2019 03:13 AM 6.2 (H) 4.8 - 5.6 % Final    Comment:    (NOTE)         Prediabetes: 5.7 - 6.4         Diabetes: >6.4         Glycemic control for adults with diabetes: <7.0    CBG: Recent Labs  Lab 10/21/19 1541 10/21/19 1915 10/21/19 2320 10/22/19 0334 10/22/19 0736  GLUCAP 128* 97 176* 128* 114*    35 minutes spent with greater than 50% of time in counseling and coordination of care.  Kipp Brood, MD [facility] ICU Physician [place]  Pager: [phone] Mobile: [phone] After hours: [phone].   10/22/2019, 10:20 AM

## 2019-10-22 NOTE — Telephone Encounter (Signed)
James [name] called back and PEC transferred her to me and we spoke  No f/u needed

## 2019-10-22 NOTE — Progress Notes (Addendum)
Nutrition Follow-up  DOCUMENTATION CODES:   Not applicable  INTERVENTION:    Ensure Enlive PO TID, each supplement provides 350 kcal and 20 grams of protein.  MVI with minerals PO daily.  NUTRITION DIAGNOSIS:   Increased nutrient needs related to catabolic illness (INOMV-67 PNA) as evidenced by estimated needs.  Ongoing   GOAL:   Patient will meet greater than or equal to 90% of their needs  Progressing  MONITOR:   PO intake, Supplement acceptance  REASON FOR ASSESSMENT:   Consult, Ventilator Enteral/tube feeding initiation and management  ASSESSMENT:   Pt with PMH of COPD, tobacco and ETOH abuse, HTN, low back pain, intellectual disability, atopic dermatitis, herpes simplex disease, DM, and bronchiectasis admitted from home 10/15 with COVID-19 PNA and quickly required intubation.  Patient was extubated 10/17. Diet advanced to CHO modified today. Patient would benefit from a concentrated nutrition supplement to maximize oral intake.  Labs reviewed.  CBG: 128-114  Medications reviewed and include vitamin C, Colace, Novolog, Miralax, prednisone, zinc, remdesivir.  I/O -257 ml since admission UOP 5 L x 24 hours  Current weight 82.3 kg down from 86.8 kg on admission.   Diet Order:   Diet Order            Diet Carb Modified Fluid consistency: Thin; Room service appropriate? Yes  Diet effective now                 EDUCATION NEEDS:   No education needs have been identified at this time  Skin:  Skin Assessment: Reviewed RN Assessment  Last BM:  no BM documented  Height:   Ht Readings from Last 1 Encounters:  10/19/19 5\' 9"  (1.753 m)    Weight:   Wt Readings from Last 1 Encounters:  10/22/19 82.3 kg    BMI:  Body mass index is 26.79 kg/m.  Estimated Nutritional Needs:   Kcal:  2400-2600  Protein:  125-150 gm  Fluid:  2 L/day    Lucas Mallow, RD, LDN, CNSC Please refer to Amion for contact information.

## 2019-10-22 NOTE — Telephone Encounter (Signed)
I attempted to call this individual.  No answer and I left a VM for her to call back.  I am in my office from now until about 445pm then out.  I can speak to her and you can tfr to [phone] prior to 445p.  If afterwards take a msg and I will call her tomorrow

## 2019-10-23 ENCOUNTER — Ambulatory Visit: Payer: BLUE CROSS/BLUE SHIELD | Admitting: Critical Care Medicine

## 2019-10-23 LAB — BASIC METABOLIC PANEL
Anion gap: 9 (ref 5–15)
BUN: 34 mg/dL — ABNORMAL HIGH (ref 6–20)
CO2: 30 mmol/L (ref 22–32)
Calcium: 9.2 mg/dL (ref 8.9–10.3)
Chloride: 99 mmol/L (ref 98–111)
Creatinine, Ser: 0.66 mg/dL (ref 0.61–1.24)
GFR, Estimated: >60 mL/min (ref >60)
Glucose, Bld: 139 mg/dL — ABNORMAL HIGH (ref 70–99)
Potassium: 4.6 mmol/L (ref 3.5–5.1)
Sodium: 138 mmol/L (ref 135–145)

## 2019-10-23 LAB — GLUCOSE, CAPILLARY
Glucose-Capillary: 127 mg/dL — ABNORMAL HIGH (ref 70–99)
Glucose-Capillary: 131 mg/dL — ABNORMAL HIGH (ref 70–99)
Glucose-Capillary: 145 mg/dL — ABNORMAL HIGH (ref 70–99)
Glucose-Capillary: 156 mg/dL — ABNORMAL HIGH (ref 70–99)
Glucose-Capillary: 242 mg/dL — ABNORMAL HIGH (ref 70–99)
Glucose-Capillary: 314 mg/dL — ABNORMAL HIGH (ref 70–99)

## 2019-10-23 MED ORDER — THIAMINE HCL 100 MG PO TABS
100.0000 mg | ORAL_TABLET | Freq: Every day | ORAL | Status: DC
  Administered 2019-10-23 – 2019-10-26 (×4): 100 mg via ORAL
  Filled 2019-10-23 (×6): qty 1

## 2019-10-23 MED ORDER — SALINE SPRAY 0.65 % NA SOLN
1.0000 | NASAL | Status: DC | PRN
  Administered 2019-10-23: 1 via NASAL
  Filled 2019-10-23: qty 44

## 2019-10-23 MED ORDER — LOSARTAN POTASSIUM 50 MG PO TABS
50.0000 mg | ORAL_TABLET | Freq: Every day | ORAL | Status: DC
  Administered 2019-10-23 – 2019-10-26 (×4): 50 mg via ORAL
  Filled 2019-10-23 (×4): qty 1

## 2019-10-23 MED ORDER — LINAGLIPTIN 5 MG PO TABS
5.0000 mg | ORAL_TABLET | Freq: Every day | ORAL | Status: DC
  Administered 2019-10-23 – 2019-10-26 (×4): 5 mg via ORAL
  Filled 2019-10-23 (×3): qty 1

## 2019-10-23 MED ORDER — FOLIC ACID 1 MG PO TABS
1.0000 mg | ORAL_TABLET | Freq: Every day | ORAL | Status: DC
  Administered 2019-10-23 – 2019-10-26 (×4): 1 mg via ORAL
  Filled 2019-10-23 (×4): qty 1

## 2019-10-23 NOTE — Consult Note (Signed)
   [facility] Inpatient Consult   10/23/2019  James [name] Apr 18, 1960 [id number]   Managed Medicaid: Active with Managed Medicaid Care Team RN  Patient is was previously active with [place] Management for chronic disease management services.  Patient has been engaged by a [place] Management Coordinator in the past and transitioned to the Managed Medicaid Care Team.     Plan:  NO [facility] Care Management needs. Will sign off.  Patient will be followed by the [place] Team RN.  For questions, please contact:  Natividad Brood, RN BSN [facility] Liaison  [phone] business mobile phone Toll free office [phone]  Fax number: [phone] [name]

## 2019-10-23 NOTE — Progress Notes (Signed)
PROGRESS NOTE                                                                             PROGRESS NOTE                                                                                                                                                                                                             Patient Demographics:    James [name], is a 59 y.o. male, DOB - Jan 05, 1960, EYC:[id number]  Outpatient Primary MD for the patient is [name], MD    LOS - 4  Admit date - 10/19/2019    Chief Complaint  Patient presents with  . Shortness of Breath      Brief Narrative    James [name] is a 59 y.o. male who has a PMH of chronic respiratory failure, COPD, diabetes, hypertension,  presented to [facility] ED 10/15 with significant dyspnea to the point that he could not speak in full sentences.  He was placed on CPAP but did not respond; therefore, he was intubated.After intubation, COVID returned positive. He has been evaluated by pulmonary on prior admissions given recurrent PNA.  He has bronchoscopy in Aug 2020 which showed severe bronchiectasis bilaterally, neutrophil predominance and transbronchial biopsies were benign.  Patient was successfully extubated 10/17, and he [facility] transferred [facility].   Subjective:    James [name] today reports his dyspnea has improved, still reports cough, denies any chest pain, fever or chills.  .   Assessment  & Plan :    Active Problems:   Acute on chronic respiratory failure with hypoxia (HCC)   Pneumonia due to COVID-19 virus  Acute Hypoxic Resp. Failure due to Acute Covid 19 Viral Pneumonitis during the ongoing 2020 Covid 19 Pandemic  -Unvaccinated, he is a critically ill on presentation required intubation. -Is with baseline hypoxia with oxygen requirement, report requiring 2 L at rest, 4 L with activity. -He is extubated 10/17. -Continue with steroids -Continue with baricitinib -Continue  with remdesivir  Encouraged the patient to sit up in chair in the daytime use I-S and flutter valve for pulmonary toiletry and then prone in bed when at night.  Will advance activity and titrate down oxygen as possible.    SpO2: 94 % O2 Flow Rate (L/min): 4 L/min FiO2 (%): 32 %  Recent Labs  Lab 10/19/19 0235 10/19/19 1425 10/20/19 0050 10/21/19 0256 10/21/19 1044  WBC 22.4*  --  15.5* 20.5* 23.8*  PLT 437*  --  268 272 286  BNP 123.3*  --   --   --   --   DDIMER  --  0.36  --   --   --   AST 29  --  18  --   --   ALT 27  --  22  --   --   ALKPHOS 52  --  36*  --   --   BILITOT 0.4  --  0.7  --   --   ALBUMIN 3.9  --  2.8*  --   --   SARSCOV2NAA POSITIVE*  --   --   --   --        ABG     Component Value Date/Time   PHART 7.416 10/20/2019 0642   PCO2ART 48.6 (H) 10/20/2019 0642   PO2ART 76 (L) 10/20/2019 0642   HCO3 31.3 (H) 10/20/2019 0642   TCO2 33 (H) 10/20/2019 0642   O2SAT 95.0 10/20/2019 0642     COPD reservation -Continue with antibiotics treatment for total of 5 days, continue with bronchodilators as well, continue with steroid taper   Hypertension -Continue with home dose metoprolol, remains elevated, will resume on home dose Cozaar.  Diabetes mellitus -We will hold home dose Metformin, will keep on sliding scale during hospital stay, will check A1c  History of alcohol abuse -Currently is day 5 of hospitalization, will monitor closely for alcohol withdrawals, meanwhile we will continue with thiamine and folic acid.  Condition - Extremely Guarded  Family Communication  : Have tried to reach his Sister [name], I have left her a voicemail.  Code Status :  Full  Consults  :  PCCM  Disposition Plan  :    Status is: Inpatient  Remains inpatient appropriate because:IV treatments appropriate due to intensity of illness or inability to take PO   Dispo: The patient is from: Home              Anticipated d/c is to: Home               Anticipated d/c date is: 3 days              Patient currently is not medically stable to d/c.      DVT Prophylaxis  :  Lovenox   Lab Results  Component Value Date   PLT 286 10/21/2019    Diet :  Diet Order            Diet Carb Modified Fluid consistency: Thin; Room service appropriate? Yes  Diet effective now                  Inpatient Medications  Scheduled Meds: . vitamin C  500 mg Oral Daily  . baricitinib  4 mg Oral Daily  . Chlorhexidine Gluconate Cloth  6 each Topical Daily  . enoxaparin (LOVENOX) injection  40 mg Subcutaneous Q24H  . feeding supplement  237 mL Oral TID BM  . insulin aspart  0-15 Units Subcutaneous Q4H  .  metoprolol succinate  25 mg Oral Daily  . mometasone-formoterol  2 puff Inhalation BID  . multivitamin with minerals  1 tablet Oral Daily  . pantoprazole  40 mg Oral QHS  . polyethylene glycol  17 g Oral Daily  . predniSONE  50 mg Oral Daily  . umeclidinium bromide  1 puff Inhalation Daily  . zinc sulfate  220 mg Oral Daily   Continuous Infusions: PRN Meds:.albuterol, docusate, polyethylene glycol  Antibiotics  :    Anti-infectives (From admission, onward)   Start     Dose/Rate Route Frequency Ordered Stop   10/20/19 1000  remdesivir 100 mg in sodium chloride 0.9 % 100 mL IVPB       "Followed by" Linked Group Details   100 mg 200 mL/hr over 30 Minutes Intravenous Daily 10/19/19 0520 10/23/19 1007   10/19/19 0800  azithromycin (ZITHROMAX) 500 mg in sodium chloride 0.9 % 250 mL IVPB        500 mg 250 mL/hr over 60 Minutes Intravenous Every 24 hours 10/19/19 0724 10/23/19 0937   10/19/19 0630  remdesivir 200 mg in sodium chloride 0.9% 250 mL IVPB       "Followed by" Linked Group Details   200 mg 580 mL/hr over 30 Minutes Intravenous Once 10/19/19 0520 10/19/19 1300   10/19/19 0600  cefTRIAXone (ROCEPHIN) 1 g in sodium chloride 0.9 % 100 mL IVPB        1 g 200 mL/hr over 30 Minutes Intravenous Every 24 hours 10/19/19 0531 10/23/19  0444        Emeline Gins Kasumi Ditullio M.D on 10/23/2019 at 3:23 PM  To page go to [email or web address]  [facility] -  Office  [phone]     Objective:   Vitals:   10/23/19 0000 10/23/19 0321 10/23/19 0726 10/23/19 1210  BP: 123/85 (!) 141/85 127/68 (!) 147/91  Pulse:  68 72 75  Resp: 17 20 20  (!) 22  Temp:  98.7 F (37.1 C) 97.7 F (36.5 C) 98.2 F (36.8 C)  TempSrc:  Oral Oral Oral  SpO2:  98% 92% 94%  Weight:  83.3 kg    Height:        Wt Readings from Last 3 Encounters:  10/23/19 83.3 kg  10/02/19 86.8 kg  09/24/19 82.1 kg     Intake/Output Summary (Last 24 hours) at 10/23/2019 1523 Last data filed at 10/23/2019 1431 Gross per 24 hour  Intake 1426.58 ml  Output 1550 ml  Net -123.42 ml     Physical Exam  Awake Alert, No new F.N deficits, extremely frail, normal affect Symmetrical Chest wall movement, Good air movement bilaterally, scattered rales RRR,No Gallops,Rubs or new Murmurs, No Parasternal Heave +ve B.Sounds, Abd Soft, No tenderness, No organomegaly appriciated, No rebound - guarding or rigidity. No Cyanosis, Clubbing or edema, No new Rash or bruise      Data Review:    CBC Recent Labs  Lab 10/19/19 0235 10/19/19 0355 10/19/19 1555 10/20/19 0050 10/20/19 0642 10/21/19 0256 10/21/19 1044  WBC 22.4*  --   --  15.5*  --  20.5* 23.8*  HGB 15.1   < > 11.6* 11.7* 11.6* 11.0* 13.0  HCT 49.0   < > 34.0* 38.1* 34.0* 35.8* 40.8  PLT 437*  --   --  268  --  272 286  MCV 100.8*  --   --  101.3*  --  103.2* 100.5*  MCH 31.1  --   --  31.1  --  31.7 32.0  MCHC  30.8  --   --  30.7  --  30.7 31.9  RDW 12.6  --   --  12.8  --  13.0 12.9  LYMPHSABS 5.4*  --   --   --   --   --   --   MONOABS 1.7*  --   --   --   --   --   --   EOSABS 0.1  --   --   --   --   --   --   BASOSABS 0.1  --   --   --   --   --   --    < > = values in this interval not displayed.    Recent Labs  Lab 10/19/19 0235 10/19/19 0355 10/19/19 1425 10/19/19 1555  10/20/19 0050 10/20/19 0642 10/21/19 1044 10/22/19 0206 10/23/19 0601  NA 141   < >  --  142 142 142  --  142 138  K 5.1   < >  --  4.4 4.4 4.7  --  4.9 4.6  CL 101  --   --   --  109  --   --  101 99  CO2 27  --   --   --  26  --   --  30 30  GLUCOSE 175*  --   --   --  178*  --   --  145* 139*  BUN 14  --   --   --  17  --   --  32* 34*  CREATININE 1.03  --   --   --  0.71  --  0.71 0.70 0.66  CALCIUM 9.8  --   --   --  8.5*  --   --  9.4 9.2  AST 29  --   --   --  18  --   --   --   --   ALT 27  --   --   --  22  --   --   --   --   ALKPHOS 52  --   --   --  36*  --   --   --   --   BILITOT 0.4  --   --   --  0.7  --   --   --   --   ALBUMIN 3.9  --   --   --  2.8*  --   --   --   --   MG  --   --   --   --  2.5*  --   --   --   --   DDIMER  --   --  0.36  --   --   --   --   --   --   BNP 123.3*  --   --   --   --   --   --   --   --    < > = values in this interval not displayed.    ------------------------------------------------------------------------------------------------------------------ Recent Labs    10/21/19 0256  TRIG 95    Lab Results  Component Value Date   HGBA1C 6.9 (H) 09/22/2019   ------------------------------------------------------------------------------------------------------------------ No results for input(s): TSH, T4TOTAL, T3FREE, THYROIDAB in the last 72 hours.  Invalid input(s): FREET3  Cardiac Enzymes No results for input(s): CKMB, TROPONINI, MYOGLOBIN in the last 168 hours.  Invalid input(s): CK ------------------------------------------------------------------------------------------------------------------    Component Value  Date/Time   BNP 123.3 (H) 10/19/2019 0235    Micro Results Recent Results (from the past 240 hour(s))  Culture, blood (routine x 2)     Status: None (Preliminary result)   Collection Time: 10/19/19  2:30 AM   Specimen: BLOOD  Result Value Ref Range Status   Specimen Description BLOOD LEFT ANTECUBITAL   Final   Special Requests   Final    BOTTLES DRAWN AEROBIC AND ANAEROBIC Blood Culture adequate volume   Culture   Final    NO GROWTH 4 DAYS Performed at [facility], [address], [place], [place] [address]    Report Status PENDING  Incomplete  Culture, blood (routine x 2)     Status: None (Preliminary result)   Collection Time: 10/19/19  2:32 AM   Specimen: BLOOD LEFT HAND  Result Value Ref Range Status   Specimen Description BLOOD LEFT HAND  Final   Special Requests   Final    BOTTLES DRAWN AEROBIC AND ANAEROBIC Blood Culture adequate volume   Culture   Final    NO GROWTH 4 DAYS Performed at [facility], [place] [address], [place], [place] [address]    Report Status PENDING  Incomplete  Respiratory Panel by RT PCR (Flu A&B, Covid) - Nasopharyngeal Swab     Status: Abnormal   Collection Time: 10/19/19  2:35 AM   Specimen: Nasopharyngeal Swab  Result Value Ref Range Status   SARS Coronavirus 2 by RT PCR POSITIVE (A) NEGATIVE Final    Comment: RESULT CALLED TO, READ BACK BY AND VERIFIED WITH: K GIBSON RN 10/19/19 0455 JDW (NOTE) SARS-CoV-2 target nucleic acids are DETECTED.  SARS-CoV-2 RNA is generally detectable in upper respiratory specimens  during the acute phase of infection. Positive results are indicative of the presence of the identified virus, but do not rule out bacterial infection or co-infection with other pathogens not detected by the test. Clinical correlation with patient history and other diagnostic information is necessary to determine patient infection status. The expected result is Negative.  Fact Sheet for Patients:  PinkCheek.be  Fact Sheet for Healthcare Providers: GravelBags.it  This test is not yet approved or cleared by the [place] FDA and  has been authorized for detection and/or diagnosis of SARS-CoV-2 by FDA under an Emergency Use Authorization (EUA).  This EUA  will remain in effect (meaning this test can be used)  for the duration of  the COVID-19 declaration under Section 564(b)(1) of the Act, 21 U.S.C. section 360bbb-3(b)(1), unless the authorization is terminated or revoked sooner.      Influenza A by PCR NEGATIVE NEGATIVE Final   Influenza B by PCR NEGATIVE NEGATIVE Final    Comment: (NOTE) The Xpert Xpress SARS-CoV-2/FLU/RSV assay is intended as an aid in  the diagnosis of influenza from Nasopharyngeal swab specimens and  should not be used as a sole basis for treatment. Nasal washings and  aspirates are unacceptable for Xpert Xpress SARS-CoV-2/FLU/RSV  testing.  Fact Sheet for Patients: PinkCheek.be  Fact Sheet for Healthcare Providers: GravelBags.it  This test is not yet approved or cleared by the [place] FDA and  has been authorized for detection and/or diagnosis of SARS-CoV-2 by  FDA under an Emergency Use Authorization (EUA). This EUA will remain  in effect (meaning this test can be used) for the duration of the  Covid-19 declaration under Section 564(b)(1) of the Act, 21  U.S.C. section 360bbb-3(b)(1), unless the authorization is  terminated or revoked. Performed at  [facility], [place] [address], [place], [place] [address]   Urine culture     Status: None   Collection Time: 10/19/19  6:49 AM   Specimen: Urine, Catheterized  Result Value Ref Range Status   Specimen Description URINE, CATHETERIZED  Final   Special Requests NONE  Final   Culture   Final    NO GROWTH Performed at [facility] [address], [place], [place] [address]    Report Status 10/20/2019 FINAL  Final  MRSA PCR Screening     Status: None   Collection Time: 10/19/19  5:01 PM   Specimen: Nasal Mucosa; Nasopharyngeal  Result Value Ref Range Status   MRSA by PCR NEGATIVE NEGATIVE Final    Comment:        The GeneXpert MRSA Assay (FDA approved for NASAL specimens only),  is one component of a comprehensive MRSA colonization surveillance program. It is not intended to diagnose MRSA infection nor to guide or monitor treatment for MRSA infections. Performed at [facility], [place] [address], [place], [place] [address]     Radiology Reports DG Abdomen 1 View  Result Date: 10/19/2019 CLINICAL DATA:  Nasogastric tube placement EXAM: ABDOMEN - 1 VIEW COMPARISON:  02/19/2019 FINDINGS: Nasogastric tube is seen looped within the gastric lumen. The visualized gas pattern is unremarkable. Pelvis excluded from view. IMPRESSION: Nasogastric tube looped within the gastric lumen. Electronically Signed   By: Fidela Salisbury MD   On: 10/19/2019 03:15   DG CHEST PORT 1 VIEW  Result Date: 10/20/2019 CLINICAL DATA:  Respiratory failure EXAM: PORTABLE CHEST 1 VIEW COMPARISON:  10/20/2019 FINDINGS: Endotracheal tube is 4.3 cm above the carina. NG tube is in the stomach. Bibasilar atelectasis or infiltrates. Heart is normal size. No effusions. No acute bony abnormality. IMPRESSION: Increasing bibasilar atelectasis or infiltrates. Electronically Signed   By: Rolm Baptise M.D.   On: 10/20/2019 21:58   DG Chest Port 1 View  Result Date: 10/20/2019 CLINICAL DATA:  Respiratory failure EXAM: PORTABLE CHEST 1 VIEW COMPARISON:  10/19/2019 chest radiographs and prior. FINDINGS: Endotracheal tube tip at the level of the clavicular heads, unchanged. Partially imaged enteric tube. Right predominant patchy bibasilar opacities are unchanged. No pneumothorax or large effusion. Blunting of the left costophrenic sulcus, unchanged. Cardiomediastinal silhouette within normal limits. Multilevel bilateral chronic rib deformities. IMPRESSION: Stable support lines and tubes. Minimal right predominant patchy bibasilar opacities, unchanged. Electronically Signed   By: Primitivo Gauze M.D.   On: 10/20/2019 07:38   DG Chest Port 1 View  Result Date: 10/19/2019 CLINICAL DATA:  Dyspnea, respiratory  failure EXAM: PORTABLE CHEST 1 VIEW COMPARISON:  09/22/2019 FINDINGS: Endotracheal tube is seen at the level of the clavicular heads 7.5 cm above the carina. Nasogastric tube extends into the upper abdomen beyond the margin of the examination. Subtle airspace infiltrate within the right lung base is improved since remote prior examinations, though persists. Stable left pleural thickening at the costophrenic angle. No pneumothorax or pleural effusion. Cardiac size within normal limits. Multiple healed left rib fractures are noted. IMPRESSION: Endotracheal tube at the level of the clavicular heads. Improved right basilar airspace infiltrate. Electronically Signed   By: Fidela Salisbury MD   On: 10/19/2019 03:14   ECHOCARDIOGRAM COMPLETE  Result Date: 10/19/2019    ECHOCARDIOGRAM REPORT   Patient Name:   DMITRI PETTIGREW Date of Exam: 10/19/2019 Medical Rec #:  [id number]      Height:  69.0 in Accession #:    [id number]     Weight:       191.4 lb Date of Birth:  [birth year]      BSA:          2.028 m Patient Age:    86 years       BP:           112/60 mmHg Patient Gender: M              HR:           97 bpm. Exam Location:  Inpatient Procedure: 2D Echo, Cardiac Doppler and Color Doppler Indications:    Elevated troponin. ; R07.9* Chest pain, unspecified  History:        Patient has no prior history of Echocardiogram examinations.                 COPD; Risk Factors:Current Smoker and Diabetes. Covid 19                 positive. Marijuana use. ETOH. Pneumonia.  Sonographer:    Roseanna Rainbow RDCS Referring Phys: [id number] [place]  1. Left ventricular ejection fraction, by estimation, is 60 to 65%. The left ventricle has normal function. The left ventricle has no regional wall motion abnormalities. There is moderate left ventricular hypertrophy. Left ventricular diastolic parameters are indeterminate.  2. Right ventricular systolic function is normal. The right ventricular size is normal. Tricuspid  regurgitation signal is inadequate for assessing PA pressure.  3. The mitral valve is normal in structure. No evidence of mitral valve regurgitation. No evidence of mitral stenosis.  4. The aortic valve is grossly normal. Unable to determine aortic valve morphology due to image quality. Aortic valve regurgitation is not visualized. No aortic stenosis is present.  5. Aortic dilatation noted. There is mild dilatation of the aortic root, measuring 41 mm. FINDINGS  Left Ventricle: Left ventricular ejection fraction, by estimation, is 60 to 65%. The left ventricle has normal function. The left ventricle has no regional wall motion abnormalities. The left ventricular internal cavity size was normal in size. There is  moderate left ventricular hypertrophy. Left ventricular diastolic parameters are indeterminate. Right Ventricle: The right ventricular size is normal. No increase in right ventricular wall thickness. Right ventricular systolic function is normal. Tricuspid regurgitation signal is inadequate for assessing PA pressure. Left Atrium: Left atrial size was normal in size. Right Atrium: Right atrial size was normal in size. Pericardium: There is no evidence of pericardial effusion. Mitral Valve: The mitral valve is normal in structure. No evidence of mitral valve regurgitation. No evidence of mitral valve stenosis. Tricuspid Valve: The tricuspid valve is normal in structure. Tricuspid valve regurgitation is not demonstrated. No evidence of tricuspid stenosis. Aortic Valve: The aortic valve is grossly normal. Aortic valve regurgitation is not visualized. No aortic stenosis is present. Pulmonic Valve: The pulmonic valve was normal in structure. Pulmonic valve regurgitation is not visualized. No evidence of pulmonic stenosis. Aorta: Aortic dilatation noted. There is mild dilatation of the aortic root, measuring 41 mm. Venous: IVC assessment for right atrial pressure unable to be performed due to mechanical ventilation.  The inferior vena cava is dilated in size with less than 50% respiratory variability, suggesting right atrial pressure of 15 mmHg. IAS/Shunts: No atrial level shunt detected by color flow Doppler.  LEFT VENTRICLE PLAX 2D LVIDd:         4.60 cm      Diastology LVIDs:  3.00 cm      LV e' medial:    6.74 cm/s LV PW:         2.30 cm      LV E/e' medial:  14.5 LV IVS:        1.60 cm      LV e' lateral:   12.20 cm/s LVOT diam:     2.20 cm      LV E/e' lateral: 8.0 LV SV:         90 LV SV Index:   44 LVOT Area:     3.80 cm  LV Volumes (MOD) LV vol d, MOD A2C: 74.8 ml LV vol d, MOD A4C: 123.0 ml LV vol s, MOD A2C: 27.6 ml LV vol s, MOD A4C: 49.1 ml LV SV MOD A2C:     47.2 ml LV SV MOD A4C:     123.0 ml LV SV MOD BP:      58.6 ml RIGHT VENTRICLE             IVC RV S prime:     18.50 cm/s  IVC diam: 2.50 cm TAPSE (M-mode): 3.0 cm LEFT ATRIUM             Index       RIGHT ATRIUM           Index LA diam:        3.40 cm 1.68 cm/m  RA Area:     15.00 cm LA Vol (A2C):   21.2 ml 10.46 ml/m RA Volume:   38.80 ml  19.14 ml/m LA Vol (A4C):   40.2 ml 19.83 ml/m LA Biplane Vol: 31.0 ml 15.29 ml/m  AORTIC VALVE LVOT Vmax:   134.00 cm/s LVOT Vmean:  84.100 cm/s LVOT VTI:    0.236 m  AORTA Ao Root diam: 4.10 cm Ao Asc diam:  3.30 cm MITRAL VALVE MV Area (PHT): 4.39 cm     SHUNTS MV Decel Time: 173 msec     Systemic VTI:  0.24 m MV E velocity: 97.90 cm/s   Systemic Diam: 2.20 cm MV A velocity: 120.00 cm/s MV E/A ratio:  0.82 Cherlynn Kaiser MD Electronically signed by Cherlynn Kaiser MD Signature Date/Time: 10/19/2019/4:34:20 PM    Final    VAS [place] LOWER EXTREMITY VENOUS (DVT)  Result Date: 10/21/2019  Lower Venous DVT Study Indications: Covid-19.  Comparison Study: No prior study on file Performing Technologist: Sharion Dove RVS  Examination Guidelines: A complete evaluation includes B-mode imaging, spectral Doppler, color Doppler, and power Doppler as needed of all accessible portions of each vessel. Bilateral testing  is considered an integral part of a complete examination. Limited examinations for reoccurring indications may be performed as noted. The reflux portion of the exam is performed with the patient in reverse Trendelenburg.  +---------+---------------+---------+-----------+----------+--------------+ RIGHT    CompressibilityPhasicitySpontaneityPropertiesThrombus Aging +---------+---------------+---------+-----------+----------+--------------+ CFV      Full           Yes      Yes                                 +---------+---------------+---------+-----------+----------+--------------+ SFJ      Full                                                        +---------+---------------+---------+-----------+----------+--------------+  FV Prox  Full                                                        +---------+---------------+---------+-----------+----------+--------------+ FV Mid   Full                                                        +---------+---------------+---------+-----------+----------+--------------+ FV DistalFull                                                        +---------+---------------+---------+-----------+----------+--------------+ PFV      Full                                                        +---------+---------------+---------+-----------+----------+--------------+ POP      Full           Yes      Yes                                 +---------+---------------+---------+-----------+----------+--------------+ PTV      Full                                                        +---------+---------------+---------+-----------+----------+--------------+ PERO     Full                                                        +---------+---------------+---------+-----------+----------+--------------+   +---------+---------------+---------+-----------+----------+--------------+ LEFT      CompressibilityPhasicitySpontaneityPropertiesThrombus Aging +---------+---------------+---------+-----------+----------+--------------+ CFV      Full           Yes      Yes                                 +---------+---------------+---------+-----------+----------+--------------+ SFJ      Full                                                        +---------+---------------+---------+-----------+----------+--------------+ FV Prox  Full                                                        +---------+---------------+---------+-----------+----------+--------------+  FV Mid   Full                                                        +---------+---------------+---------+-----------+----------+--------------+ FV DistalFull                                                        +---------+---------------+---------+-----------+----------+--------------+ PFV      Full                                                        +---------+---------------+---------+-----------+----------+--------------+ POP      Full           Yes      Yes                                 +---------+---------------+---------+-----------+----------+--------------+ PTV      Full                                                        +---------+---------------+---------+-----------+----------+--------------+ PERO     Full                                                        +---------+---------------+---------+-----------+----------+--------------+     Summary: BILATERAL: - No evidence of deep vein thrombosis seen in the lower extremities, bilaterally. -   *See table(s) above for measurements and observations. Electronically signed by Monica Martinez MD on 10/21/2019 at 3:01:02 PM.    Final

## 2019-10-24 LAB — CULTURE, BLOOD (ROUTINE X 2)
Culture: NO GROWTH
Culture: NO GROWTH
Special Requests: ADEQUATE
Special Requests: ADEQUATE

## 2019-10-24 LAB — CBC
HCT: 43 % (ref 39.0–52.0)
Hemoglobin: 13.9 g/dL (ref 13.0–17.0)
MCH: 31.2 pg (ref 26.0–34.0)
MCHC: 32.3 g/dL (ref 30.0–36.0)
MCV: 96.4 fL (ref 80.0–100.0)
Platelets: 300 10*3/uL (ref 150–400)
RBC: 4.46 MIL/uL (ref 4.22–5.81)
RDW: 12.2 % (ref 11.5–15.5)
WBC: 17.3 10*3/uL — ABNORMAL HIGH (ref 4.0–10.5)
nRBC: 0 % (ref 0.0–0.2)

## 2019-10-24 LAB — BASIC METABOLIC PANEL
Anion gap: 9 (ref 5–15)
BUN: 31 mg/dL — ABNORMAL HIGH (ref 6–20)
CO2: 31 mmol/L (ref 22–32)
Calcium: 9.4 mg/dL (ref 8.9–10.3)
Chloride: 99 mmol/L (ref 98–111)
Creatinine, Ser: 0.73 mg/dL (ref 0.61–1.24)
GFR, Estimated: >60 mL/min (ref >60)
Glucose, Bld: 130 mg/dL — ABNORMAL HIGH (ref 70–99)
Potassium: 4.6 mmol/L (ref 3.5–5.1)
Sodium: 139 mmol/L (ref 135–145)

## 2019-10-24 LAB — GLUCOSE, CAPILLARY
Glucose-Capillary: 114 mg/dL — ABNORMAL HIGH (ref 70–99)
Glucose-Capillary: 96 mg/dL (ref 70–99)
Glucose-Capillary: 128 mg/dL — ABNORMAL HIGH (ref 70–99)
Glucose-Capillary: 183 mg/dL — ABNORMAL HIGH (ref 70–99)
Glucose-Capillary: 188 mg/dL — ABNORMAL HIGH (ref 70–99)

## 2019-10-24 MED ORDER — INSULIN ASPART 100 UNIT/ML ~~LOC~~ SOLN: 0.0000 [IU] | Freq: Three times a day (TID) | SUBCUTANEOUS | Status: DC

## 2019-10-24 MED ORDER — INSULIN ASPART 100 UNIT/ML ~~LOC~~ SOLN
0.0000 [IU] | Freq: Three times a day (TID) | SUBCUTANEOUS | Status: DC
  Administered 2019-10-24 (×2): 3 [IU] via SUBCUTANEOUS
  Administered 2019-10-24: 2 [IU] via SUBCUTANEOUS
  Administered 2019-10-25: 3 [IU] via SUBCUTANEOUS
  Administered 2019-10-25: 2 [IU] via SUBCUTANEOUS
  Administered 2019-10-25: 5 [IU] via SUBCUTANEOUS
  Administered 2019-10-26: 3 [IU] via SUBCUTANEOUS

## 2019-10-24 NOTE — Progress Notes (Signed)
Occupational Therapy Evaluation Patient Details Name: James [name] MRN: [id number] DOB: 10/04/1960 Today's Date: 10/24/2019    History of Present Illness 59 y.o. male who has a PMH of chronic respiratory failure, COPD, diabetes, hypertension,  presented to [facility] ED 10/15 with significant dyspnea to the point that he could not speak in full sentences.  He was placed on CPAP but did not respond; therefore, he was intubated.After intubation, COVID returned positive. extubated 10/17.   Clinical Impression   PTA, pt lived alone and was independent with ADL, mobility, did his own cleaning/cooking and drove. Sister assists with medication management and checks on him daily. Completed ADL and mobility on 3L with minguard assist. 2/4 DOE; HR 91; RR24. Pt with mild instability. Recommend pt ambulate with staff to facilitate DC to home. Safest with RW in hallway and "furniture walked" in room. Pt also complained of poor vision - recommend pt follow up with eye doctor after DC (he does not have care established with eye doctor). Will follow acutely to facilitate safe DC home.     Follow Up Recommendations  No OT follow up;Supervision - Intermittent    Equipment Recommendations  Tub/shower seat    Recommendations for Other Services PT consult     Precautions / Restrictions Precautions Precautions: Fall      Mobility Bed Mobility               General bed mobility comments: OOB in chair    Transfers Overall transfer level: Needs assistance   Transfers: Sit to/from Stand Sit to Stand: Supervision              Balance Overall balance assessment: Mild deficits observed, not formally tested                                         ADL either performed or assessed with clinical judgement   ADL Overall ADL's : Needs assistance/impaired     Grooming: Supervision/safety;Standing   Upper Body Bathing: Sitting;Set up   Lower Body Bathing: Min guard;Sit to/from  stand   Upper Body Dressing : Set up;Sitting   Lower Body Dressing: Min guard;Sit to/from stand   Toilet Transfer: Min guard;Ambulation   Toileting- Clothing Manipulation and Hygiene: Supervision/safety Toileting - Clothing Manipulation Details (indicate cue type and reason): standing to urinate     Functional mobility during ADLs: Min guard;Rolling walker;Cueing for safety       Vision Baseline Vision/History: Wears glasses Wears Glasses: Reading only Patient Visual Report: Blurring of vision Additional Comments: States his vision is "not good". Does not see an eye doctor regualrly - needs to follow up      Perception     Praxis      Pertinent Vitals/Pain Pain Assessment: No/denies pain     Hand Dominance Right   Extremity/Trunk Assessment Upper Extremity Assessment Upper Extremity Assessment: Generalized weakness   Lower Extremity Assessment Lower Extremity Assessment: Defer to PT evaluation   Cervical / Trunk Assessment Cervical / Trunk Assessment: Normal   Communication     Cognition Arousal/Alertness: Awake/alert Behavior During Therapy: WFL for tasks assessed/performed Overall Cognitive Status: No family/caregiver present to determine baseline cognitive functioning                                 General Comments: most likely at baseline; did  not graduate form hs; can read/write "a little"   General Comments       Exercises Exercises: Other exercises Other Exercises Other Exercises: incentive spirometer x 5 - able to pull 1249ml   Shoulder Instructions      Home Living Family/patient expects to be discharged to:: Private residence Living Arrangements: Alone Available Help at Discharge: Family;Available PRN/intermittently (24/7 available if needed) Type of Home: House Home Access: Stairs to enter [employer] of Steps: 2   Home Layout: One level     Bathroom Shower/Tub: Teacher, early years/pre:  Standard Bathroom Accessibility: Yes How Accessible: Accessible via walker Home Equipment: None          Prior Functioning/Environment Level of Independence: Independent        Comments: drives, manages own finances, cooks/cleans himself; sister does medication (enjoys fishing)        OT Problem List: Decreased strength;Decreased activity tolerance;Impaired balance (sitting and/or standing);Decreased cognition;Decreased safety awareness;Decreased knowledge of use of DME or AE;Cardiopulmonary status limiting activity      OT Treatment/Interventions: Self-care/ADL training;Therapeutic exercise;Energy conservation;DME and/or AE instruction;Therapeutic activities;Cognitive remediation/compensation;Patient/family education;Balance training    OT Goals(Current goals can be found in the care plan section) Acute Rehab OT Goals Patient Stated Goal: to get better adn go home OT Goal Formulation: With patient Time For Goal Achievement: 11/07/19 Potential to Achieve Goals: Good  OT Frequency: Min 2X/week   Barriers to D/C:            Co-evaluation              AM-PAC OT "6 Clicks" Daily Activity     Outcome Measure Help from another person eating meals?: None Help from another person taking care of personal grooming?: A Little Help from another person toileting, which includes using toliet, bedpan, or urinal?: A Little Help from another person bathing (including washing, rinsing, drying)?: A Little Help from another person to put on and taking off regular upper body clothing?: A Little Help from another person to put on and taking off regular lower body clothing?: A Little 6 Click Score: 19   End of Session Equipment Utilized During Treatment: Gait belt;Rolling walker;Oxygen (3L for mobility) Nurse Communication: Mobility status  Activity Tolerance: Patient tolerated treatment well Patient left: in chair;with call bell/phone within reach  OT Visit Diagnosis: Unsteadiness  on feet (R26.81);Muscle weakness (generalized) (M62.81);Other symptoms and signs involving cognitive function                Time: 1610-9604 OT Time Calculation (min): 30 min Charges:  OT General Charges $OT Visit: 1 Visit OT Evaluation $OT Eval Moderate Complexity: 1 Mod OT Treatments $Self Care/Home Management : 8-22 mins  Maurie Boettcher, OT/L   Acute OT Clinical Specialist Acute Rehabilitation Services Pager [phone] Office [phone]   [facility] 10/24/2019, 12:53 PM

## 2019-10-24 NOTE — Progress Notes (Signed)
SATURATION QUALIFICATIONS: (This note is used to comply with regulatory documentation for home oxygen)  Patient Saturations on 4L at Rest = 94%  Patient Saturations on 3L while Ambulating = 90%    Please briefly explain why patient needs home oxygen:Requires 3L O2 to maintain SpO2 above 90 for ADL and mobility  Maurie Boettcher, OT/L   Acute OT Clinical Specialist Acute Rehabilitation Services Pager [phone] Office [phone]

## 2019-10-24 NOTE — Progress Notes (Signed)
PROGRESS NOTE                                                                             PROGRESS NOTE                                                                                                                                                                                                             Patient Demographics:    James [name], is a 59 y.o. male, DOB - 1960/04/10, DTO:[id number]  Outpatient Primary MD for the patient is [name], MD    LOS - 5  Admit date - 10/19/2019    Chief Complaint  Patient presents with  . Shortness of Breath      Brief Narrative    James [name] is a 59 y.o. male who has a PMH of chronic respiratory failure, COPD, diabetes, hypertension,  presented to [facility] ED 10/15 with significant dyspnea to the point that he could not speak in full sentences.  He was placed on CPAP but did not respond; therefore, he was intubated.After intubation, COVID returned positive. He has been evaluated by pulmonary on prior admissions given recurrent PNA.  He has bronchoscopy in Aug 2020 which showed severe bronchiectasis bilaterally, neutrophil predominance and transbronchial biopsies were benign.  Patient was successfully extubated 10/17, and [facility].   Subjective:    James [name] today reports dyspnea back to baseline, still reports some cough, no fever or chills.   Assessment  & Plan :    Active Problems:   Acute on chronic respiratory failure with hypoxia (HCC)   Pneumonia due to COVID-19 virus  Acute Hypoxic Resp. Failure due to Acute Covid 19 Viral Pneumonitis during the ongoing 2020 Covid 19 Pandemic  -Unvaccinated, he is a critically ill on presentation required intubation. -Is with baseline hypoxia with oxygen requirement, report requiring 2 L at rest, 4 L with activity. -He is extubated 10/17. -Continue with steroids -Continue with baricitinib -Continue with  remdesivir   Encouraged the  patient to sit up in chair in the daytime use I-S and flutter valve for pulmonary toiletry and then prone in bed when at night.  Will advance activity and titrate down oxygen as possible.    SpO2: 92 % O2 Flow Rate (L/min): 4 L/min FiO2 (%): 32 %  Recent Labs  Lab 10/19/19 0235 10/19/19 1425 10/20/19 0050 10/21/19 0256 10/21/19 1044 10/24/19 0208  WBC 22.4*  --  15.5* 20.5* 23.8* 17.3*  PLT 437*  --  268 272 286 300  BNP 123.3*  --   --   --   --   --   DDIMER  --  0.36  --   --   --   --   AST 29  --  18  --   --   --   ALT 27  --  22  --   --   --   ALKPHOS 52  --  36*  --   --   --   BILITOT 0.4  --  0.7  --   --   --   ALBUMIN 3.9  --  2.8*  --   --   --   SARSCOV2NAA POSITIVE*  --   --   --   --   --        ABG     Component Value Date/Time   PHART 7.416 10/20/2019 0642   PCO2ART 48.6 (H) 10/20/2019 0642   PO2ART 76 (L) 10/20/2019 0642   HCO3 31.3 (H) 10/20/2019 0642   TCO2 33 (H) 10/20/2019 0642   O2SAT 95.0 10/20/2019 0642     COPD reservation -Treated with antibiotics for 5 days, continue with bronchodilators, continue with steroid taper .  Hypertension -Continue with home dose metoprolol, remains elevated, will resume on home dose Cozaar.  Diabetes mellitus -We will hold home dose Metformin, will keep on sliding scale during hospital stay, will check A1c  History of alcohol abuse -Currently is day 5 of hospitalization, will monitor closely for alcohol withdrawals, meanwhile we will continue with thiamine and folic acid.  Condition - Extremely Guarded  Family Communication  : Voicemail for his James [name] on 10/19 ,left voicemail for both sisters James [name] and James [name] 10/20  Code Status :  Full  Consults  :  PCCM  Disposition Plan  :    Status is: Inpatient  Remains inpatient appropriate because:IV treatments appropriate due to intensity of illness or inability to take PO   Dispo: The patient is from: Home               Anticipated d/c is to: Home              Anticipated d/c date is: 3 days              Patient currently is not medically stable to d/c.      DVT Prophylaxis  :  Lovenox   Lab Results  Component Value Date   PLT 300 10/24/2019    Diet :  Diet Order            Diet Carb Modified Fluid consistency: Thin; Room service appropriate? Yes  Diet effective now                  Inpatient Medications  Scheduled Meds: . vitamin C  500 mg Oral Daily  . baricitinib  4 mg Oral Daily  . Chlorhexidine Gluconate Cloth  6 each Topical Daily  . enoxaparin (LOVENOX) injection  40 mg Subcutaneous Q24H  . feeding supplement  237 mL Oral TID BM  . folic acid  1 mg Oral Daily  . insulin aspart  0-15 Units Subcutaneous TID AC & HS  . linagliptin  5 mg Oral Daily  . losartan  50 mg Oral Daily  . metoprolol succinate  25 mg Oral Daily  . mometasone-formoterol  2 puff Inhalation BID  . multivitamin with minerals  1 tablet Oral Daily  . pantoprazole  40 mg Oral QHS  . polyethylene glycol  17 g Oral Daily  . predniSONE  50 mg Oral Daily  . thiamine  100 mg Oral Daily  . umeclidinium bromide  1 puff Inhalation Daily  . zinc sulfate  220 mg Oral Daily   Continuous Infusions: PRN Meds:.albuterol, docusate, polyethylene glycol, sodium chloride  Antibiotics  :    Anti-infectives (From admission, onward)   Start     Dose/Rate Route Frequency Ordered Stop   10/20/19 1000  remdesivir 100 mg in sodium chloride 0.9 % 100 mL IVPB       "Followed by" Linked Group Details   100 mg 200 mL/hr over 30 Minutes Intravenous Daily 10/19/19 0520 10/23/19 1007   10/19/19 0800  azithromycin (ZITHROMAX) 500 mg in sodium chloride 0.9 % 250 mL IVPB        500 mg 250 mL/hr over 60 Minutes Intravenous Every 24 hours 10/19/19 0724 10/23/19 0937   10/19/19 0630  remdesivir 200 mg in sodium chloride 0.9% 250 mL IVPB       "Followed by" Linked Group Details   200 mg 580 mL/hr over 30 Minutes Intravenous Once  10/19/19 0520 10/19/19 1300   10/19/19 0600  cefTRIAXone (ROCEPHIN) 1 g in sodium chloride 0.9 % 100 mL IVPB        1 g 200 mL/hr over 30 Minutes Intravenous Every 24 hours 10/19/19 0531 10/23/19 0444        Emeline Gins Shacora Zynda M.D on 10/24/2019 at 3:10 PM  To page go to [email or web address]  [facility] -  Office  [phone]     Objective:   Vitals:   10/24/19 0319 10/24/19 0625 10/24/19 0815 10/24/19 1136  BP: 121/70  134/83 122/78  Pulse: 70  92 72  Resp: 18  20 19   Temp: 97.9 F (36.6 C)  (!) 97.5 F (36.4 C) 98.8 F (37.1 C)  TempSrc: Oral  Axillary Oral  SpO2: 98%  90% 92%  Weight:  80.6 kg    Height:        Wt Readings from Last 3 Encounters:  10/24/19 80.6 kg  10/02/19 86.8 kg  09/24/19 82.1 kg     Intake/Output Summary (Last 24 hours) at 10/24/2019 1510 Last data filed at 10/24/2019 1300 Gross per 24 hour  Intake 360 ml  Output 3400 ml  Net -3040 ml     Physical Exam  Awake Alert, Oriented X 3, frail and deconditioned, no new F.N deficits, Normal affect Symmetrical Chest wall movement, Good air movement bilaterally, Rales, no use of accessory muscles. RRR,No Gallops,Rubs or new Murmurs, No Parasternal Heave +ve B.Sounds, Abd Soft, No tenderness, No rebound - guarding or rigidity. No Cyanosis, Clubbing or edema, No new Rash or bruise skin rash.    Data Review:    CBC Recent Labs  Lab 10/19/19 0235 10/19/19 0355 10/20/19 0050 10/20/19 0642 10/21/19 0256 10/21/19 1044 10/24/19 0208  WBC 22.4*  --  15.5*  --  20.5* 23.8* 17.3*  HGB 15.1   < >  11.7* 11.6* 11.0* 13.0 13.9  HCT 49.0   < > 38.1* 34.0* 35.8* 40.8 43.0  PLT 437*  --  268  --  272 286 300  MCV 100.8*  --  101.3*  --  103.2* 100.5* 96.4  MCH 31.1  --  31.1  --  31.7 32.0 31.2  MCHC 30.8  --  30.7  --  30.7 31.9 32.3  RDW 12.6  --  12.8  --  13.0 12.9 12.2  LYMPHSABS 5.4*  --   --   --   --   --   --   MONOABS 1.7*  --   --   --   --   --   --   EOSABS 0.1  --   --   --    --   --   --   BASOSABS 0.1  --   --   --   --   --   --    < > = values in this interval not displayed.    Recent Labs  Lab 10/19/19 0235 10/19/19 0355 10/19/19 1425 10/19/19 1555 10/20/19 0050 10/20/19 6073 10/21/19 1044 10/22/19 0206 10/23/19 0601 10/24/19 0208  NA 141   < >  --    < > 142 142  --  142 138 139  K 5.1   < >  --    < > 4.4 4.7  --  4.9 4.6 4.6  CL 101  --   --   --  109  --   --  101 99 99  CO2 27  --   --   --  26  --   --  30 30 31   GLUCOSE 175*  --   --   --  178*  --   --  145* 139* 130*  BUN 14  --   --   --  17  --   --  32* 34* 31*  CREATININE 1.03   < >  --   --  0.71  --  0.71 0.70 0.66 0.73  CALCIUM 9.8  --   --   --  8.5*  --   --  9.4 9.2 9.4  AST 29  --   --   --  18  --   --   --   --   --   ALT 27  --   --   --  22  --   --   --   --   --   ALKPHOS 52  --   --   --  36*  --   --   --   --   --   BILITOT 0.4  --   --   --  0.7  --   --   --   --   --   ALBUMIN 3.9  --   --   --  2.8*  --   --   --   --   --   MG  --   --   --   --  2.5*  --   --   --   --   --   DDIMER  --   --  0.36  --   --   --   --   --   --   --   BNP 123.3*  --   --   --   --   --   --   --   --   --    < > =  values in this interval not displayed.    ------------------------------------------------------------------------------------------------------------------ No results for input(s): CHOL, HDL, LDLCALC, TRIG, CHOLHDL, LDLDIRECT in the last 72 hours.  Lab Results  Component Value Date   HGBA1C 6.9 (H) 09/22/2019   ------------------------------------------------------------------------------------------------------------------ No results for input(s): TSH, T4TOTAL, T3FREE, THYROIDAB in the last 72 hours.  Invalid input(s): FREET3  Cardiac Enzymes No results for input(s): CKMB, TROPONINI, MYOGLOBIN in the last 168 hours.  Invalid input(s): CK ------------------------------------------------------------------------------------------------------------------      Component Value Date/Time   BNP 123.3 (H) 10/19/2019 0235    Micro Results Recent Results (from the past 240 hour(s))  Culture, blood (routine x 2)     Status: None   Collection Time: 10/19/19  2:30 AM   Specimen: BLOOD  Result Value Ref Range Status   Specimen Description BLOOD LEFT ANTECUBITAL  Final   Special Requests   Final    BOTTLES DRAWN AEROBIC AND ANAEROBIC Blood Culture adequate volume   Culture   Final    NO GROWTH 5 DAYS Performed at [facility], [address], [place], [place] [address]    Report Status 10/24/2019 FINAL  Final  Culture, blood (routine x 2)     Status: None   Collection Time: 10/19/19  2:32 AM   Specimen: BLOOD LEFT HAND  Result Value Ref Range Status   Specimen Description BLOOD LEFT HAND  Final   Special Requests   Final    BOTTLES DRAWN AEROBIC AND ANAEROBIC Blood Culture adequate volume   Culture   Final    NO GROWTH 5 DAYS Performed at [facility], [place] [address], [place], [place] [address]    Report Status 10/24/2019 FINAL  Final  Respiratory Panel by RT PCR (Flu A&B, Covid) - Nasopharyngeal Swab     Status: Abnormal   Collection Time: 10/19/19  2:35 AM   Specimen: Nasopharyngeal Swab  Result Value Ref Range Status   SARS Coronavirus 2 by RT PCR POSITIVE (A) NEGATIVE Final    Comment: RESULT CALLED TO, READ BACK BY AND VERIFIED WITH: K GIBSON RN 10/19/19 0455 JDW (NOTE) SARS-CoV-2 target nucleic acids are DETECTED.  SARS-CoV-2 RNA is generally detectable in upper respiratory specimens  during the acute phase of infection. Positive results are indicative of the presence of the identified virus, but do not rule out bacterial infection or co-infection with other pathogens not detected by the test. Clinical correlation with patient history and other diagnostic information is necessary to determine patient infection status. The expected result is Negative.  Fact Sheet for Patients:   PinkCheek.be  Fact Sheet for Healthcare Providers: GravelBags.it  This test is not yet approved or cleared by the [place] FDA and  has been authorized for detection and/or diagnosis of SARS-CoV-2 by FDA under an Emergency Use Authorization (EUA).  This EUA will remain in effect (meaning this test can be used)  for the duration of  the COVID-19 declaration under Section 564(b)(1) of the Act, 21 U.S.C. section 360bbb-3(b)(1), unless the authorization is terminated or revoked sooner.      Influenza A by PCR NEGATIVE NEGATIVE Final   Influenza B by PCR NEGATIVE NEGATIVE Final    Comment: (NOTE) The Xpert Xpress SARS-CoV-2/FLU/RSV assay is intended as an aid in  the diagnosis of influenza from Nasopharyngeal swab specimens and  should not be used as a sole basis for treatment. Nasal washings and  aspirates are unacceptable for Xpert Xpress SARS-CoV-2/FLU/RSV  testing.  Fact Sheet for Patients: PinkCheek.be  Fact Sheet for Healthcare Providers: GravelBags.it  This test is not yet approved or cleared by the [place] FDA and  has been authorized for detection and/or diagnosis of SARS-CoV-2 by  FDA under an Emergency Use Authorization (EUA). This EUA will remain  in effect (meaning this test can be used) for the duration of the  Covid-19 declaration under Section 564(b)(1) of the Act, 21  U.S.C. section 360bbb-3(b)(1), unless the authorization is  terminated or revoked. Performed at [facility], [place] [address] [place], [name] [address]   Urine culture     Status: None   Collection Time: 10/19/19  6:49 AM   Specimen: Urine, Catheterized  Result Value Ref Range Status   Specimen Description URINE, CATHETERIZED  Final   Special Requests NONE  Final   Culture   Final    NO GROWTH Performed at [place] [address] [place], [place]  [address]    Report Status 10/20/2019 FINAL  Final  MRSA PCR Screening     Status: None   Collection Time: 10/19/19  5:01 PM   Specimen: Nasal Mucosa; Nasopharyngeal  Result Value Ref Range Status   MRSA by PCR NEGATIVE NEGATIVE Final    Comment:        The GeneXpert MRSA Assay (FDA approved for NASAL specimens only), is one component of a comprehensive MRSA colonization surveillance program. It is not intended to diagnose MRSA infection nor to guide or monitor treatment for MRSA infections. Performed at [facility], [place] [address], [place], [place] [address]     Radiology Reports DG Abdomen 1 View  Result Date: 10/19/2019 CLINICAL DATA:  Nasogastric tube placement EXAM: ABDOMEN - 1 VIEW COMPARISON:  02/19/2019 FINDINGS: Nasogastric tube is seen looped within the gastric lumen. The visualized gas pattern is unremarkable. Pelvis excluded from view. IMPRESSION: Nasogastric tube looped within the gastric lumen. Electronically Signed   By: Fidela Salisbury MD   On: 10/19/2019 03:15   DG CHEST PORT 1 VIEW  Result Date: 10/20/2019 CLINICAL DATA:  Respiratory failure EXAM: PORTABLE CHEST 1 VIEW COMPARISON:  10/20/2019 FINDINGS: Endotracheal tube is 4.3 cm above the carina. NG tube is in the stomach. Bibasilar atelectasis or infiltrates. Heart is normal size. No effusions. No acute bony abnormality. IMPRESSION: Increasing bibasilar atelectasis or infiltrates. Electronically Signed   By: Rolm Baptise M.D.   On: 10/20/2019 21:58   DG Chest Port 1 View  Result Date: 10/20/2019 CLINICAL DATA:  Respiratory failure EXAM: PORTABLE CHEST 1 VIEW COMPARISON:  10/19/2019 chest radiographs and prior. FINDINGS: Endotracheal tube tip at the level of the clavicular heads, unchanged. Partially imaged enteric tube. Right predominant patchy bibasilar opacities are unchanged. No pneumothorax or large effusion. Blunting of the left costophrenic sulcus, unchanged. Cardiomediastinal silhouette within normal  limits. Multilevel bilateral chronic rib deformities. IMPRESSION: Stable support lines and tubes. Minimal right predominant patchy bibasilar opacities, unchanged. Electronically Signed   By: Primitivo Gauze M.D.   On: 10/20/2019 07:38   DG Chest Port 1 View  Result Date: 10/19/2019 CLINICAL DATA:  Dyspnea, respiratory failure EXAM: PORTABLE CHEST 1 VIEW COMPARISON:  09/22/2019 FINDINGS: Endotracheal tube is seen at the level of the clavicular heads 7.5 cm above the carina. Nasogastric tube extends into the upper abdomen beyond the margin of the examination. Subtle airspace infiltrate within the right lung base is improved since remote prior examinations, though persists. Stable left pleural thickening at the costophrenic angle. No pneumothorax or pleural effusion. Cardiac size  within normal limits. Multiple healed left rib fractures are noted. IMPRESSION: Endotracheal tube at the level of the clavicular heads. Improved right basilar airspace infiltrate. Electronically Signed   By: Fidela Salisbury MD   On: 10/19/2019 03:14   ECHOCARDIOGRAM COMPLETE  Result Date: 10/19/2019    ECHOCARDIOGRAM REPORT   Patient Name:   KRON EVERTON Date of Exam: 10/19/2019 Medical Rec #:  [id number]      Height:       69.0 in Accession #:    [id number]     Weight:       191.4 lb Date of Birth:  08/21/1960      BSA:          2.028 m Patient Age:    3 years       BP:           112/60 mmHg Patient Gender: M              HR:           97 bpm. Exam Location:  Inpatient Procedure: 2D Echo, Cardiac Doppler and Color Doppler Indications:    Elevated troponin. ; R07.9* Chest pain, unspecified  History:        Patient has no prior history of Echocardiogram examinations.                 COPD; Risk Factors:Current Smoker and Diabetes. Covid 19                 positive. Marijuana use. ETOH. Pneumonia.  Sonographer:    Roseanna Rainbow RDCS Referring Phys: [id number] [place]  1. Left ventricular ejection fraction, by estimation,  is 60 to 65%. The left ventricle has normal function. The left ventricle has no regional wall motion abnormalities. There is moderate left ventricular hypertrophy. Left ventricular diastolic parameters are indeterminate.  2. Right ventricular systolic function is normal. The right ventricular size is normal. Tricuspid regurgitation signal is inadequate for assessing PA pressure.  3. The mitral valve is normal in structure. No evidence of mitral valve regurgitation. No evidence of mitral stenosis.  4. The aortic valve is grossly normal. Unable to determine aortic valve morphology due to image quality. Aortic valve regurgitation is not visualized. No aortic stenosis is present.  5. Aortic dilatation noted. There is mild dilatation of the aortic root, measuring 41 mm. FINDINGS  Left Ventricle: Left ventricular ejection fraction, by estimation, is 60 to 65%. The left ventricle has normal function. The left ventricle has no regional wall motion abnormalities. The left ventricular internal cavity size was normal in size. There is  moderate left ventricular hypertrophy. Left ventricular diastolic parameters are indeterminate. Right Ventricle: The right ventricular size is normal. No increase in right ventricular wall thickness. Right ventricular systolic function is normal. Tricuspid regurgitation signal is inadequate for assessing PA pressure. Left Atrium: Left atrial size was normal in size. Right Atrium: Right atrial size was normal in size. Pericardium: There is no evidence of pericardial effusion. Mitral Valve: The mitral valve is normal in structure. No evidence of mitral valve regurgitation. No evidence of mitral valve stenosis. Tricuspid Valve: The tricuspid valve is normal in structure. Tricuspid valve regurgitation is not demonstrated. No evidence of tricuspid stenosis. Aortic Valve: The aortic valve is grossly normal. Aortic valve regurgitation is not visualized. No aortic stenosis is present. Pulmonic Valve:  The pulmonic valve was normal in structure. Pulmonic valve regurgitation is not visualized. No evidence of pulmonic stenosis. Aorta: Aortic dilatation  noted. There is mild dilatation of the aortic root, measuring 41 mm. Venous: IVC assessment for right atrial pressure unable to be performed due to mechanical ventilation. The inferior vena cava is dilated in size with less than 50% respiratory variability, suggesting right atrial pressure of 15 mmHg. IAS/Shunts: No atrial level shunt detected by color flow Doppler.  LEFT VENTRICLE PLAX 2D LVIDd:         4.60 cm      Diastology LVIDs:         3.00 cm      LV e' medial:    6.74 cm/s LV PW:         2.30 cm      LV E/e' medial:  14.5 LV IVS:        1.60 cm      LV e' lateral:   12.20 cm/s LVOT diam:     2.20 cm      LV E/e' lateral: 8.0 LV SV:         90 LV SV Index:   44 LVOT Area:     3.80 cm  LV Volumes (MOD) LV vol d, MOD A2C: 74.8 ml LV vol d, MOD A4C: 123.0 ml LV vol s, MOD A2C: 27.6 ml LV vol s, MOD A4C: 49.1 ml LV SV MOD A2C:     47.2 ml LV SV MOD A4C:     123.0 ml LV SV MOD BP:      58.6 ml RIGHT VENTRICLE             IVC RV S prime:     18.50 cm/s  IVC diam: 2.50 cm TAPSE (M-mode): 3.0 cm LEFT ATRIUM             Index       RIGHT ATRIUM           Index LA diam:        3.40 cm 1.68 cm/m  RA Area:     15.00 cm LA Vol (A2C):   21.2 ml 10.46 ml/m RA Volume:   38.80 ml  19.14 ml/m LA Vol (A4C):   40.2 ml 19.83 ml/m LA Biplane Vol: 31.0 ml 15.29 ml/m  AORTIC VALVE LVOT Vmax:   134.00 cm/s LVOT Vmean:  84.100 cm/s LVOT VTI:    0.236 m  AORTA Ao Root diam: 4.10 cm Ao Asc diam:  3.30 cm MITRAL VALVE MV Area (PHT): 4.39 cm     SHUNTS MV Decel Time: 173 msec     Systemic VTI:  0.24 m MV E velocity: 97.90 cm/s   Systemic Diam: 2.20 cm MV A velocity: 120.00 cm/s MV E/A ratio:  0.82 Cherlynn Kaiser MD Electronically signed by Cherlynn Kaiser MD Signature Date/Time: 10/19/2019/4:34:20 PM    Final    VAS [place] LOWER EXTREMITY VENOUS (DVT)  Result Date: 10/21/2019   Lower Venous DVT Study Indications: Covid-19.  Comparison Study: No prior study on file Performing Technologist: Sharion Dove RVS  Examination Guidelines: A complete evaluation includes B-mode imaging, spectral Doppler, color Doppler, and power Doppler as needed of all accessible portions of each vessel. Bilateral testing is considered an integral part of a complete examination. Limited examinations for reoccurring indications may be performed as noted. The reflux portion of the exam is performed with the patient in reverse Trendelenburg.  +---------+---------------+---------+-----------+----------+--------------+ RIGHT    CompressibilityPhasicitySpontaneityPropertiesThrombus Aging +---------+---------------+---------+-----------+----------+--------------+ CFV      Full           Yes  Yes                                 +---------+---------------+---------+-----------+----------+--------------+ SFJ      Full                                                        +---------+---------------+---------+-----------+----------+--------------+ FV Prox  Full                                                        +---------+---------------+---------+-----------+----------+--------------+ FV Mid   Full                                                        +---------+---------------+---------+-----------+----------+--------------+ FV DistalFull                                                        +---------+---------------+---------+-----------+----------+--------------+ PFV      Full                                                        +---------+---------------+---------+-----------+----------+--------------+ POP      Full           Yes      Yes                                 +---------+---------------+---------+-----------+----------+--------------+ PTV      Full                                                         +---------+---------------+---------+-----------+----------+--------------+ PERO     Full                                                        +---------+---------------+---------+-----------+----------+--------------+   +---------+---------------+---------+-----------+----------+--------------+ LEFT     CompressibilityPhasicitySpontaneityPropertiesThrombus Aging +---------+---------------+---------+-----------+----------+--------------+ CFV      Full           Yes      Yes                                 +---------+---------------+---------+-----------+----------+--------------+ SFJ      Full                                                        +---------+---------------+---------+-----------+----------+--------------+  FV Prox  Full                                                        +---------+---------------+---------+-----------+----------+--------------+ FV Mid   Full                                                        +---------+---------------+---------+-----------+----------+--------------+ FV DistalFull                                                        +---------+---------------+---------+-----------+----------+--------------+ PFV      Full                                                        +---------+---------------+---------+-----------+----------+--------------+ POP      Full           Yes      Yes                                 +---------+---------------+---------+-----------+----------+--------------+ PTV      Full                                                        +---------+---------------+---------+-----------+----------+--------------+ PERO     Full                                                        +---------+---------------+---------+-----------+----------+--------------+     Summary: BILATERAL: - No evidence of deep vein thrombosis seen in the lower extremities, bilaterally. -   *See table(s)  above for measurements and observations. Electronically signed by Monica Martinez MD on 10/21/2019 at 3:01:02 PM.    Final

## 2019-10-24 NOTE — Evaluation (Signed)
Physical Therapy Evaluation Patient Details Name: James [name] MRN: [id number] DOB: [birth year] Today's Date: 10/24/2019   History of Present Illness  Pt is a 59 y.o. male admitted 10/19/19 with significant dyspnea requiring intubation. Tested (+) COVID-19. ETT 10/15-10/17. PMH includes HTN, COPD, DM, ETOH abuse, cannot read or write (per chart); pt unvaccinated.    Clinical Impression  Pt presents with an overall decrease in functional mobility secondary to above. PTA, pt independent, drives, lives alone with supportive family available. Initiated education re: current condition, O2 needs, activity recommendations, therex and importance of mobility. Today, pt able to transfer and ambulate with and without RW; stability and energy conservation improved with RW, but pt declined one for home use. SpO2 91-94% on 3L O2 [name]. Pt would benefit from continued acute PT services to maximize functional mobility and independence prior to d/c home.    Follow Up Recommendations No PT follow up;Supervision - Intermittent    Equipment Recommendations  None recommended by PT (declined RW)    Recommendations for Other Services       Precautions / Restrictions Precautions Precautions: Fall Restrictions Weight Bearing Restrictions: No      Mobility  Bed Mobility               General bed mobility comments: Received sitting in recliner    Transfers Overall transfer level: Needs assistance Equipment used: None Transfers: Sit to/from Stand Sit to Stand: Supervision            Ambulation/Gait Ambulation/Gait assistance: Min guard;Supervision Gait Distance (Feet): 310 Feet Assistive device: None;Rolling walker (2 wheeled) Gait Pattern/deviations: Step-through pattern;Decreased stride length     General Gait Details: Slow, mostly steady gait in room with min guard for balance, pt reaching to furniture for added UE support; requesting RW for hallway ambulation, improved stability at  supervision-level, increased speed for return to room due to fatigue; cues for safety and pursed lip breathing  Stairs            Wheelchair Mobility    Modified Rankin (Stroke Patients Only)       Balance Overall balance assessment: Needs assistance   Sitting balance-Leahy Scale: Good       Standing balance-Leahy Scale: Fair Standing balance comment: Can stand at toilet to void and walk without UE support, likely unable to accept challenge                             Pertinent Vitals/Pain Pain Assessment: No/denies pain    Home Living Family/patient expects to be discharged to:: Private residence Living Arrangements: Alone Available Help at Discharge: Family;Available PRN/intermittently (24/7 if needed) Type of Home: House Home Access: Stairs to enter   [employer] of Steps: 1-2 Home Layout: One level Home Equipment: None      Prior Function Level of Independence: Independent         Comments: drives, manages own finances, cooks/cleans himself; sister does medication; enjoys fishing     Hand Dominance   Dominant Hand: Right    Extremity/Trunk Assessment   Upper Extremity Assessment Upper Extremity Assessment: Generalized weakness    Lower Extremity Assessment Lower Extremity Assessment: Generalized weakness    Cervical / Trunk Assessment Cervical / Trunk Assessment: Normal  Communication   Communication: HOH  Cognition Arousal/Alertness: Awake/alert Behavior During Therapy: WFL for tasks assessed/performed Overall Cognitive Status: No family/caregiver present to determine baseline cognitive functioning  General Comments: most likely at baseline; did not graduate form high school; can read/write "a little"      General Comments General comments (skin integrity, edema, etc.): Discussed RW for home use - pt declined    Exercises General Exercises - Lower Extremity Ankle  Circles/Pumps: AROM;Both;Seated Long Arc Quad: AROM;Both;Seated Hip Flexion/Marching: AROM;Both;Seated   Assessment/Plan    PT Assessment Patient needs continued PT services  PT Problem List Decreased strength;Decreased activity tolerance;Decreased balance;Decreased mobility;Decreased knowledge of use of DME;Cardiopulmonary status limiting activity       PT Treatment Interventions DME instruction;Gait training;Stair training;Functional mobility training;Therapeutic activities;Therapeutic exercise;Balance training;Patient/family education    PT Goals (Current goals can be found in the Care Plan section)  Acute Rehab PT Goals Patient Stated Goal: to get better and go home PT Goal Formulation: With patient Time For Goal Achievement: 11/07/19 Potential to Achieve Goals: Good    Frequency Min 3X/week   Barriers to discharge        Co-evaluation               AM-PAC PT "6 Clicks" Mobility  Outcome Measure Help needed turning from your back to your side while in a flat bed without using bedrails?: None Help needed moving from lying on your back to sitting on the side of a flat bed without using bedrails?: None Help needed moving to and from a bed to a chair (including a wheelchair)?: A Little Help needed standing up from a chair using your arms (e.g., wheelchair or bedside chair)?: None Help needed to walk in hospital room?: A Little Help needed climbing 3-5 steps with a railing? : A Little 6 Click Score: 21    End of Session Equipment Utilized During Treatment: Oxygen Activity Tolerance: Patient tolerated treatment well Patient left: in chair;with call bell/phone within reach Nurse Communication: Mobility status PT Visit Diagnosis: Other abnormalities of gait and mobility (R26.89)    Time: 3244-0102 PT Time Calculation (min) (ACUTE ONLY): 16 min   Charges:   PT Evaluation $PT Eval Moderate Complexity: 1 Mod     James [name], PT, DPT Acute Rehabilitation  Services  Pager [phone] Office [place] 10/24/2019, 4:33 PM

## 2019-10-25 LAB — GLUCOSE, CAPILLARY
Glucose-Capillary: 96 mg/dL (ref 70–99)
Glucose-Capillary: 139 mg/dL — ABNORMAL HIGH (ref 70–99)
Glucose-Capillary: 209 mg/dL — ABNORMAL HIGH (ref 70–99)
Glucose-Capillary: 221 mg/dL — ABNORMAL HIGH (ref 70–99)
Glucose-Capillary: 187 mg/dL — ABNORMAL HIGH (ref 70–99)

## 2019-10-25 LAB — HEMOGLOBIN A1C
Hgb A1c MFr Bld: 6.8 % — ABNORMAL HIGH (ref 4.8–5.6)
Mean Plasma Glucose: 148 mg/dL

## 2019-10-25 MED ORDER — MELATONIN 3 MG PO TABS
3.0000 mg | ORAL_TABLET | Freq: Every day | ORAL | Status: DC
  Administered 2019-10-25: 3 mg via ORAL
  Filled 2019-10-25: qty 1

## 2019-10-25 NOTE — TOC Progression Note (Signed)
Transition of Care [facility]) - Progression Note    Patient Details  Name: James [name] MRN: [id number] Date of Birth: Jul 05, 1960  Transition of Care [facility]) CM/SW [place], RN Phone Number: 10/25/2019, 12:49 PM  Clinical Narrative:     James [name] for [facility] cannot take due to staffing. Will try [facility]. Patient will stay on his regular oxygen settings at home per [name] [employer]  Expected Discharge Plan: [place] Barriers to Discharge: No Barriers Identified  Expected Discharge Plan and Services Expected Discharge Plan: [place] arrangements for the past 2 months: Single Family Home                                       Social Determinants of Health (SDOH) Interventions    Readmission Risk Interventions Readmission Risk Prevention Plan 04/26/2019  Transportation Screening Complete  PCP or Specialist Appt within 3-5 Days Complete  HRI or [place] Complete  Social Work Consult for [place] Planning/Counseling Complete  Palliative Care Screening Not Applicable  Medication Review [employer]) Complete  Some recent data might be hidden

## 2019-10-25 NOTE — TOC Transition Note (Signed)
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 He would like some help at home. MD to put in [facility] orders. He does not have a agency preference. Patient does not know who the company is that brings his oxygen. He gave me permission to call his sister [name]. I called and left a voice message for her to return my call. The patient currently wears 2LPM during the day and 4LPM at night. MD to write orders resuming oxygen and Home Health orders.    Final next level of care: Home w Home Health Services Barriers to Discharge: No Barriers Identified   Patient Goals and CMS Choice Patient states their goals for this hospitalization and ongoing recovery are:: Go on Home   Choice offered to / list presented to : Patient  Discharge Placement                       Discharge Plan and Services                                     Social Determinants of Health (SDOH) Interventions     Readmission Risk Interventions Readmission Risk Prevention Plan 04/26/2019  Transportation Screening Complete  PCP or Specialist Appt within 3-5 Days Complete  HRI or [place] Complete  Social Work Consult for [place] Planning/Counseling Complete  Palliative Care Screening Not Applicable  Medication Review Press photographer) Complete  Some recent data might be hidden

## 2019-10-25 NOTE — Progress Notes (Signed)
PROGRESS NOTE                                                                             PROGRESS NOTE                                                                                                                                                                                                             Patient Demographics:    James [name], is a 59 y.o. male, DOB - 12/28/1960, HKV:[id number]  Outpatient Primary MD for the patient is [name], MD    LOS - 6  Admit date - 10/19/2019    Chief Complaint  Patient presents with  . Shortness of Breath      Brief Narrative    James [name] is a 59 y.o. male who has a PMH of chronic respiratory failure, COPD, diabetes, hypertension,  presented to [facility] ED 10/15 with significant dyspnea to the point that he could not speak in full sentences.  He was placed on CPAP but did not respond; therefore, he was intubated.After intubation, COVID returned positive. He has been evaluated by pulmonary on prior admissions given recurrent PNA.  He has bronchoscopy in Aug 2020 which showed severe bronchiectasis bilaterally, neutrophil predominance and transbronchial biopsies were benign.  Patient was successfully extubated 10/17, and [facility].   Subjective:    James [name] today ports dyspnea has improved, cough has improved as well, no fever, no chills, reports good appetite .   Assessment  & Plan :    Active Problems:   Acute on chronic respiratory failure with hypoxia (HCC)   Pneumonia due to COVID-19 virus  Acute Hypoxic Resp. Failure due to Acute Covid 19 Viral Pneumonitis during the ongoing 2020 Covid 19 Pandemic  -Unvaccinated, he is a critically ill on presentation required intubation. -Is with baseline hypoxia with oxygen requirement, report requiring 2 L at rest, 4 L with activity. -He is extubated 10/17. -Continue with steroids, transition to oral -Continue with  baricitinib -  Continue with remdesivir   Encouraged the patient to sit up in chair in the daytime use I-S and flutter valve for pulmonary toiletry and then prone in bed when at night.  Will advance activity and titrate down oxygen as possible.    SpO2: 90 % O2 Flow Rate (L/min): 3 L/min FiO2 (%): 32 %  Recent Labs  Lab 10/19/19 0235 10/19/19 1425 10/20/19 0050 10/21/19 0256 10/21/19 1044 10/24/19 0208  WBC 22.4*  --  15.5* 20.5* 23.8* 17.3*  PLT 437*  --  268 272 286 300  BNP 123.3*  --   --   --   --   --   DDIMER  --  0.36  --   --   --   --   AST 29  --  18  --   --   --   ALT 27  --  22  --   --   --   ALKPHOS 52  --  36*  --   --   --   BILITOT 0.4  --  0.7  --   --   --   ALBUMIN 3.9  --  2.8*  --   --   --   SARSCOV2NAA POSITIVE*  --   --   --   --   --        ABG     Component Value Date/Time   PHART 7.416 10/20/2019 0642   PCO2ART 48.6 (H) 10/20/2019 0642   PO2ART 76 (L) 10/20/2019 0642   HCO3 31.3 (H) 10/20/2019 0642   TCO2 33 (H) 10/20/2019 0642   O2SAT 95.0 10/20/2019 0642     COPD reservation -Treated with antibiotics for 5 days, continue with bronchodilators, continue with steroid taper .  Hypertension -Continue with home dose metoprolol, remains elevated, will resume on home dose Cozaar.  Diabetes mellitus -We will hold home dose Metformin, will keep on sliding scale during hospital stay,  A1c is 6.8  History of alcohol abuse -Currently is day 5 of hospitalization, will monitor closely for alcohol withdrawals, meanwhile we will continue with thiamine and folic acid.  Condition - Extremely Guarded  Family Communication  : Voicemail for his James [name] on 10/19 ,left voicemail for both sisters James [name] and James [name] 10/20, D/W sister James [name] on 10/21  Code Status :  Full  Consults  :  PCCM  Disposition Plan  :    Status is: Inpatient  Remains inpatient appropriate because:IV treatments appropriate due to intensity of illness or inability to  take PO   Dispo: The patient is from: Home              Anticipated d/c is to: Home              Anticipated d/c date is: 1 day              Patient currently is not medically stable to d/c.      DVT Prophylaxis  :  Lovenox   Lab Results  Component Value Date   PLT 300 10/24/2019    Diet :  Diet Order            Diet Carb Modified Fluid consistency: Thin; Room service appropriate? Yes  Diet effective now                  Inpatient Medications  Scheduled Meds: . vitamin C  500 mg Oral Daily  . baricitinib  4 mg Oral Daily  .  Chlorhexidine Gluconate Cloth  6 each Topical Daily  . enoxaparin (LOVENOX) injection  40 mg Subcutaneous Q24H  . feeding supplement  237 mL Oral TID BM  . folic acid  1 mg Oral Daily  . insulin aspart  0-15 Units Subcutaneous TID AC & HS  . linagliptin  5 mg Oral Daily  . losartan  50 mg Oral Daily  . metoprolol succinate  25 mg Oral Daily  . mometasone-formoterol  2 puff Inhalation BID  . multivitamin with minerals  1 tablet Oral Daily  . pantoprazole  40 mg Oral QHS  . polyethylene glycol  17 g Oral Daily  . predniSONE  50 mg Oral Daily  . thiamine  100 mg Oral Daily  . umeclidinium bromide  1 puff Inhalation Daily  . zinc sulfate  220 mg Oral Daily   Continuous Infusions: PRN Meds:.albuterol, docusate, polyethylene glycol, sodium chloride  Antibiotics  :    Anti-infectives (From admission, onward)   Start     Dose/Rate Route Frequency Ordered Stop   10/20/19 1000  remdesivir 100 mg in sodium chloride 0.9 % 100 mL IVPB       "Followed by" Linked Group Details   100 mg 200 mL/hr over 30 Minutes Intravenous Daily 10/19/19 0520 10/23/19 1007   10/19/19 0800  azithromycin (ZITHROMAX) 500 mg in sodium chloride 0.9 % 250 mL IVPB        500 mg 250 mL/hr over 60 Minutes Intravenous Every 24 hours 10/19/19 0724 10/23/19 0937   10/19/19 0630  remdesivir 200 mg in sodium chloride 0.9% 250 mL IVPB       "Followed by" Linked Group Details    200 mg 580 mL/hr over 30 Minutes Intravenous Once 10/19/19 0520 10/19/19 1300   10/19/19 0600  cefTRIAXone (ROCEPHIN) 1 g in sodium chloride 0.9 % 100 mL IVPB        1 g 200 mL/hr over 30 Minutes Intravenous Every 24 hours 10/19/19 0531 10/23/19 0444        Emeline Gins Alexandrina Fiorini M.D on 10/25/2019 at 3:16 PM  To page go to [email or web address]  [facility] -  Office  [phone]     Objective:   Vitals:   10/25/19 0427 10/25/19 0429 10/25/19 0736 10/25/19 1120  BP: 135/86   127/79  Pulse: 70   83  Resp: 18 18  20   Temp: 98.2 F (36.8 C)  97.9 F (36.6 C) 97.7 F (36.5 C)  TempSrc: Oral  Oral Oral  SpO2: 92%  90% 90%  Weight:      Height:        Wt Readings from Last 3 Encounters:  10/24/19 80.6 kg  10/02/19 86.8 kg  09/24/19 82.1 kg     Intake/Output Summary (Last 24 hours) at 10/25/2019 1516 Last data filed at 10/25/2019 1428 Gross per 24 hour  Intake 1080 ml  Output 1590 ml  Net -510 ml     Physical Exam  Awake Alert, Oriented X 3, No new F.N deficits, Normal affect Symmetrical Chest wall movement, Good air movement bilaterally, scattered rales RRR,No Gallops,Rubs or new Murmurs, No Parasternal Heave +ve B.Sounds, Abd Soft, No tenderness, No rebound - guarding or rigidity. No Cyanosis, Clubbing or edema, No new Rash or bruise      Data Review:    CBC Recent Labs  Lab 10/19/19 0235 10/19/19 0355 10/20/19 0050 10/20/19 0642 10/21/19 0256 10/21/19 1044 10/24/19 0208  WBC 22.4*  --  15.5*  --  20.5* 23.8* 17.3*  HGB  15.1   < > 11.7* 11.6* 11.0* 13.0 13.9  HCT 49.0   < > 38.1* 34.0* 35.8* 40.8 43.0  PLT 437*  --  268  --  272 286 300  MCV 100.8*  --  101.3*  --  103.2* 100.5* 96.4  MCH 31.1  --  31.1  --  31.7 32.0 31.2  MCHC 30.8  --  30.7  --  30.7 31.9 32.3  RDW 12.6  --  12.8  --  13.0 12.9 12.2  LYMPHSABS 5.4*  --   --   --   --   --   --   MONOABS 1.7*  --   --   --   --   --   --   EOSABS 0.1  --   --   --   --   --   --   BASOSABS  0.1  --   --   --   --   --   --    < > = values in this interval not displayed.    Recent Labs  Lab 10/19/19 0235 10/19/19 0355 10/19/19 1425 10/19/19 1555 10/20/19 0050 10/20/19 1696 10/21/19 1044 10/22/19 0206 10/23/19 0601 10/24/19 0208  NA 141   < >  --    < > 142 142  --  142 138 139  K 5.1   < >  --    < > 4.4 4.7  --  4.9 4.6 4.6  CL 101  --   --   --  109  --   --  101 99 99  CO2 27  --   --   --  26  --   --  30 30 31   GLUCOSE 175*  --   --   --  178*  --   --  145* 139* 130*  BUN 14  --   --   --  17  --   --  32* 34* 31*  CREATININE 1.03   < >  --   --  0.71  --  0.71 0.70 0.66 0.73  CALCIUM 9.8  --   --   --  8.5*  --   --  9.4 9.2 9.4  AST 29  --   --   --  18  --   --   --   --   --   ALT 27  --   --   --  22  --   --   --   --   --   ALKPHOS 52  --   --   --  36*  --   --   --   --   --   BILITOT 0.4  --   --   --  0.7  --   --   --   --   --   ALBUMIN 3.9  --   --   --  2.8*  --   --   --   --   --   MG  --   --   --   --  2.5*  --   --   --   --   --   DDIMER  --   --  0.36  --   --   --   --   --   --   --   HGBA1C  --   --   --   --   --   --   --   --   --  6.8*  BNP 123.3*  --   --   --   --   --   --   --   --   --    < > = values in this interval not displayed.    ------------------------------------------------------------------------------------------------------------------ No results for input(s): CHOL, HDL, LDLCALC, TRIG, CHOLHDL, LDLDIRECT in the last 72 hours.  Lab Results  Component Value Date   HGBA1C 6.8 (H) 10/24/2019   ------------------------------------------------------------------------------------------------------------------ No results for input(s): TSH, T4TOTAL, T3FREE, THYROIDAB in the last 72 hours.  Invalid input(s): FREET3  Cardiac Enzymes No results for input(s): CKMB, TROPONINI, MYOGLOBIN in the last 168 hours.  Invalid input(s):  CK ------------------------------------------------------------------------------------------------------------------    Component Value Date/Time   BNP 123.3 (H) 10/19/2019 0235    Micro Results Recent Results (from the past 240 hour(s))  Culture, blood (routine x 2)     Status: None   Collection Time: 10/19/19  2:30 AM   Specimen: BLOOD  Result Value Ref Range Status   Specimen Description BLOOD LEFT ANTECUBITAL  Final   Special Requests   Final    BOTTLES DRAWN AEROBIC AND ANAEROBIC Blood Culture adequate volume   Culture   Final    NO GROWTH 5 DAYS Performed at [facility], [address] [place], [place] [address]    Report Status 10/24/2019 FINAL  Final  Culture, blood (routine x 2)     Status: None   Collection Time: 10/19/19  2:32 AM   Specimen: BLOOD LEFT HAND  Result Value Ref Range Status   Specimen Description BLOOD LEFT HAND  Final   Special Requests   Final    BOTTLES DRAWN AEROBIC AND ANAEROBIC Blood Culture adequate volume   Culture   Final    NO GROWTH 5 DAYS Performed at [facility], [place] [address] [place], [place] [address]    Report Status 10/24/2019 FINAL  Final  Respiratory Panel by RT PCR (Flu A&B, Covid) - Nasopharyngeal Swab     Status: Abnormal   Collection Time: 10/19/19  2:35 AM   Specimen: Nasopharyngeal Swab  Result Value Ref Range Status   SARS Coronavirus 2 by RT PCR POSITIVE (A) NEGATIVE Final    Comment: RESULT CALLED TO, READ BACK BY AND VERIFIED WITH: K GIBSON RN 10/19/19 0455 JDW (NOTE) SARS-CoV-2 target nucleic acids are DETECTED.  SARS-CoV-2 RNA is generally detectable in upper respiratory specimens  during the acute phase of infection. Positive results are indicative of the presence of the identified virus, but do not rule out bacterial infection or co-infection with other pathogens not detected by the test. Clinical correlation with patient history and other diagnostic information is necessary to determine  patient infection status. The expected result is Negative.  Fact Sheet for Patients:  PinkCheek.be  Fact Sheet for Healthcare Providers: GravelBags.it  This test is not yet approved or cleared by the [place] FDA and  has been authorized for detection and/or diagnosis of SARS-CoV-2 by FDA under an Emergency Use Authorization (EUA).  This EUA will remain in effect (meaning this test can be used)  for the duration of  the COVID-19 declaration under Section 564(b)(1) of the Act, 21 U.S.C. section 360bbb-3(b)(1), unless the authorization is terminated or revoked sooner.      Influenza A by PCR NEGATIVE NEGATIVE Final   Influenza B by PCR NEGATIVE NEGATIVE Final    Comment: (NOTE) The Xpert Xpress SARS-CoV-2/FLU/RSV assay is intended as an aid in  the diagnosis of influenza from  Nasopharyngeal swab specimens and  should not be used as a sole basis for treatment. Nasal washings and  aspirates are unacceptable for Xpert Xpress SARS-CoV-2/FLU/RSV  testing.  Fact Sheet for Patients: PinkCheek.be  Fact Sheet for Healthcare Providers: GravelBags.it  This test is not yet approved or cleared by the [place] FDA and  has been authorized for detection and/or diagnosis of SARS-CoV-2 by  FDA under an Emergency Use Authorization (EUA). This EUA will remain  in effect (meaning this test can be used) for the duration of the  Covid-19 declaration under Section 564(b)(1) of the Act, 21  U.S.C. section 360bbb-3(b)(1), unless the authorization is  terminated or revoked. Performed at [facility], [place] [address] [place], [place] [address]   Urine culture     Status: None   Collection Time: 10/19/19  6:49 AM   Specimen: Urine, Catheterized  Result Value Ref Range Status   Specimen Description URINE, CATHETERIZED  Final   Special Requests NONE  Final   Culture    Final    NO GROWTH Performed at [facility] [address], [place], [place] [address]    Report Status 10/20/2019 FINAL  Final  MRSA PCR Screening     Status: None   Collection Time: 10/19/19  5:01 PM   Specimen: Nasal Mucosa; Nasopharyngeal  Result Value Ref Range Status   MRSA by PCR NEGATIVE NEGATIVE Final    Comment:        The GeneXpert MRSA Assay (FDA approved for NASAL specimens only), is one component of a comprehensive MRSA colonization surveillance program. It is not intended to diagnose MRSA infection nor to guide or monitor treatment for MRSA infections. Performed at [facility], [place] [address], [place], [place] [address]     Radiology Reports DG Abdomen 1 View  Result Date: 10/19/2019 CLINICAL DATA:  Nasogastric tube placement EXAM: ABDOMEN - 1 VIEW COMPARISON:  02/19/2019 FINDINGS: Nasogastric tube is seen looped within the gastric lumen. The visualized gas pattern is unremarkable. Pelvis excluded from view. IMPRESSION: Nasogastric tube looped within the gastric lumen. Electronically Signed   By: Fidela Salisbury MD   On: 10/19/2019 03:15   DG CHEST PORT 1 VIEW  Result Date: 10/20/2019 CLINICAL DATA:  Respiratory failure EXAM: PORTABLE CHEST 1 VIEW COMPARISON:  10/20/2019 FINDINGS: Endotracheal tube is 4.3 cm above the carina. NG tube is in the stomach. Bibasilar atelectasis or infiltrates. Heart is normal size. No effusions. No acute bony abnormality. IMPRESSION: Increasing bibasilar atelectasis or infiltrates. Electronically Signed   By: Rolm Baptise M.D.   On: 10/20/2019 21:58   DG Chest Port 1 View  Result Date: 10/20/2019 CLINICAL DATA:  Respiratory failure EXAM: PORTABLE CHEST 1 VIEW COMPARISON:  10/19/2019 chest radiographs and prior. FINDINGS: Endotracheal tube tip at the level of the clavicular heads, unchanged. Partially imaged enteric tube. Right predominant patchy bibasilar opacities are unchanged. No pneumothorax or large effusion.  Blunting of the left costophrenic sulcus, unchanged. Cardiomediastinal silhouette within normal limits. Multilevel bilateral chronic rib deformities. IMPRESSION: Stable support lines and tubes. Minimal right predominant patchy bibasilar opacities, unchanged. Electronically Signed   By: Primitivo Gauze M.D.   On: 10/20/2019 07:38   DG Chest Port 1 View  Result Date: 10/19/2019 CLINICAL DATA:  Dyspnea, respiratory failure EXAM: PORTABLE CHEST 1 VIEW COMPARISON:  09/22/2019 FINDINGS: Endotracheal tube is seen at the level of the clavicular heads 7.5 cm above the carina. Nasogastric tube extends into the upper abdomen beyond the  margin of the examination. Subtle airspace infiltrate within the right lung base is improved since remote prior examinations, though persists. Stable left pleural thickening at the costophrenic angle. No pneumothorax or pleural effusion. Cardiac size within normal limits. Multiple healed left rib fractures are noted. IMPRESSION: Endotracheal tube at the level of the clavicular heads. Improved right basilar airspace infiltrate. Electronically Signed   By: Fidela Salisbury MD   On: 10/19/2019 03:14   ECHOCARDIOGRAM COMPLETE  Result Date: 10/19/2019    ECHOCARDIOGRAM REPORT   Patient Name:   James [name] Date of Exam: 10/19/2019 Medical Rec #:  [id number]      Height:       69.0 in Accession #:    [id number]     Weight:       191.4 lb Date of Birth:  Jun 25, 1960      BSA:          2.028 m Patient Age:    89 years       BP:           112/60 mmHg Patient Gender: M              HR:           97 bpm. Exam Location:  Inpatient Procedure: 2D Echo, Cardiac Doppler and Color Doppler Indications:    Elevated troponin. ; R07.9* Chest pain, unspecified  History:        Patient has no prior history of Echocardiogram examinations.                 COPD; Risk Factors:Current Smoker and Diabetes. Covid 19                 positive. Marijuana use. ETOH. Pneumonia.  Sonographer:    Roseanna Rainbow RDCS  Referring Phys: [id number] [place] Vaya  1. Left ventricular ejection fraction, by estimation, is 60 to 65%. The left ventricle has normal function. The left ventricle has no regional wall motion abnormalities. There is moderate left ventricular hypertrophy. Left ventricular diastolic parameters are indeterminate.  2. Right ventricular systolic function is normal. The right ventricular size is normal. Tricuspid regurgitation signal is inadequate for assessing PA pressure.  3. The mitral valve is normal in structure. No evidence of mitral valve regurgitation. No evidence of mitral stenosis.  4. The aortic valve is grossly normal. Unable to determine aortic valve morphology due to image quality. Aortic valve regurgitation is not visualized. No aortic stenosis is present.  5. Aortic dilatation noted. There is mild dilatation of the aortic root, measuring 41 mm. FINDINGS  Left Ventricle: Left ventricular ejection fraction, by estimation, is 60 to 65%. The left ventricle has normal function. The left ventricle has no regional wall motion abnormalities. The left ventricular internal cavity size was normal in size. There is  moderate left ventricular hypertrophy. Left ventricular diastolic parameters are indeterminate. Right Ventricle: The right ventricular size is normal. No increase in right ventricular wall thickness. Right ventricular systolic function is normal. Tricuspid regurgitation signal is inadequate for assessing PA pressure. Left Atrium: Left atrial size was normal in size. Right Atrium: Right atrial size was normal in size. Pericardium: There is no evidence of pericardial effusion. Mitral Valve: The mitral valve is normal in structure. No evidence of mitral valve regurgitation. No evidence of mitral valve stenosis. Tricuspid Valve: The tricuspid valve is normal in structure. Tricuspid valve regurgitation is not demonstrated. No evidence of tricuspid stenosis. Aortic Valve: The aortic valve is  grossly  normal. Aortic valve regurgitation is not visualized. No aortic stenosis is present. Pulmonic Valve: The pulmonic valve was normal in structure. Pulmonic valve regurgitation is not visualized. No evidence of pulmonic stenosis. Aorta: Aortic dilatation noted. There is mild dilatation of the aortic root, measuring 41 mm. Venous: IVC assessment for right atrial pressure unable to be performed due to mechanical ventilation. The inferior vena cava is dilated in size with less than 50% respiratory variability, suggesting right atrial pressure of 15 mmHg. IAS/Shunts: No atrial level shunt detected by color flow Doppler.  LEFT VENTRICLE PLAX 2D LVIDd:         4.60 cm      Diastology LVIDs:         3.00 cm      LV e' medial:    6.74 cm/s LV PW:         2.30 cm      LV E/e' medial:  14.5 LV IVS:        1.60 cm      LV e' lateral:   12.20 cm/s LVOT diam:     2.20 cm      LV E/e' lateral: 8.0 LV SV:         90 LV SV Index:   44 LVOT Area:     3.80 cm  LV Volumes (MOD) LV vol d, MOD A2C: 74.8 ml LV vol d, MOD A4C: 123.0 ml LV vol s, MOD A2C: 27.6 ml LV vol s, MOD A4C: 49.1 ml LV SV MOD A2C:     47.2 ml LV SV MOD A4C:     123.0 ml LV SV MOD BP:      58.6 ml RIGHT VENTRICLE             IVC RV S prime:     18.50 cm/s  IVC diam: 2.50 cm TAPSE (M-mode): 3.0 cm LEFT ATRIUM             Index       RIGHT ATRIUM           Index LA diam:        3.40 cm 1.68 cm/m  RA Area:     15.00 cm LA Vol (A2C):   21.2 ml 10.46 ml/m RA Volume:   38.80 ml  19.14 ml/m LA Vol (A4C):   40.2 ml 19.83 ml/m LA Biplane Vol: 31.0 ml 15.29 ml/m  AORTIC VALVE LVOT Vmax:   134.00 cm/s LVOT Vmean:  84.100 cm/s LVOT VTI:    0.236 m  AORTA Ao Root diam: 4.10 cm Ao Asc diam:  3.30 cm MITRAL VALVE MV Area (PHT): 4.39 cm     SHUNTS MV Decel Time: 173 msec     Systemic VTI:  0.24 m MV E velocity: 97.90 cm/s   Systemic Diam: 2.20 cm MV A velocity: 120.00 cm/s MV E/A ratio:  0.82 Cherlynn Kaiser MD Electronically signed by Cherlynn Kaiser MD Signature  Date/Time: 10/19/2019/4:34:20 PM    Final    VAS [place] LOWER EXTREMITY VENOUS (DVT)  Result Date: 10/21/2019  Lower Venous DVT Study Indications: Covid-19.  Comparison Study: No prior study on file Performing Technologist: Sharion Dove RVS  Examination Guidelines: A complete evaluation includes B-mode imaging, spectral Doppler, color Doppler, and power Doppler as needed of all accessible portions of each vessel. Bilateral testing is considered an integral part of a complete examination. Limited examinations for reoccurring indications may be performed as noted. The reflux portion of the exam is performed with the  patient in reverse Trendelenburg.  +---------+---------------+---------+-----------+----------+--------------+ RIGHT    CompressibilityPhasicitySpontaneityPropertiesThrombus Aging +---------+---------------+---------+-----------+----------+--------------+ CFV      Full           Yes      Yes                                 +---------+---------------+---------+-----------+----------+--------------+ SFJ      Full                                                        +---------+---------------+---------+-----------+----------+--------------+ FV Prox  Full                                                        +---------+---------------+---------+-----------+----------+--------------+ FV Mid   Full                                                        +---------+---------------+---------+-----------+----------+--------------+ FV DistalFull                                                        +---------+---------------+---------+-----------+----------+--------------+ PFV      Full                                                        +---------+---------------+---------+-----------+----------+--------------+ POP      Full           Yes      Yes                                  +---------+---------------+---------+-----------+----------+--------------+ PTV      Full                                                        +---------+---------------+---------+-----------+----------+--------------+ PERO     Full                                                        +---------+---------------+---------+-----------+----------+--------------+   +---------+---------------+---------+-----------+----------+--------------+ LEFT     CompressibilityPhasicitySpontaneityPropertiesThrombus Aging +---------+---------------+---------+-----------+----------+--------------+ CFV      Full           Yes      Yes                                 +---------+---------------+---------+-----------+----------+--------------+  SFJ      Full                                                        +---------+---------------+---------+-----------+----------+--------------+ FV Prox  Full                                                        +---------+---------------+---------+-----------+----------+--------------+ FV Mid   Full                                                        +---------+---------------+---------+-----------+----------+--------------+ FV DistalFull                                                        +---------+---------------+---------+-----------+----------+--------------+ PFV      Full                                                        +---------+---------------+---------+-----------+----------+--------------+ POP      Full           Yes      Yes                                 +---------+---------------+---------+-----------+----------+--------------+ PTV      Full                                                        +---------+---------------+---------+-----------+----------+--------------+ PERO     Full                                                         +---------+---------------+---------+-----------+----------+--------------+     Summary: BILATERAL: - No evidence of deep vein thrombosis seen in the lower extremities, bilaterally. -   *See table(s) above for measurements and observations. Electronically signed by Monica Martinez MD on 10/21/2019 at 3:01:02 PM.    Final

## 2019-10-25 NOTE — TOC Progression Note (Signed)
Transition of Care [facility]) - Progression Note    Patient Details  Name: DAMION KANT MRN: [id number] Date of Birth: Jul 30, 1960  Transition of Care [facility]) CM/SW Roy, RN Phone Number: 10/25/2019, 2:58 PM  Clinical Narrative:     Follow up appointment with [facility] November 2 was already scheduled. They may call him and change this to a little further out.   Expected Discharge Plan: [place] Barriers to Discharge: No Barriers Identified  Expected Discharge Plan and Services Expected Discharge Plan: [place]   Discharge Planning Services: Follow-up appt scheduled   Living arrangements for the past 2 months: Single Family Home                                       Social Determinants of Health (SDOH) Interventions    Readmission Risk Interventions Readmission Risk Prevention Plan 04/26/2019  Transportation Screening Complete  PCP or Specialist Appt within 3-5 Days Complete  HRI or [place] Complete  Social Work Consult for [place] Planning/Counseling Complete  Palliative Care Screening Not Applicable  Medication Review [employer] photographer) Complete  Some recent data might be hidden

## 2019-10-25 NOTE — Progress Notes (Signed)
Physical Therapy Treatment Patient Details Name: James [name] MRN: [id number] DOB: 1960-01-06 Today's Date: 10/25/2019    History of Present Illness Pt is a 59 y.o. male admitted 10/19/19 with significant dyspnea requiring intubation. Tested (+) COVID-19. ETT 10/15-10/17. PMH includes HTN, COPD, DM, ETOH abuse, cannot read or write (per chart); pt unvaccinated.    PT Comments    Patient reports he has home O2 that he uses on 2-4L (sounds like he uses 4L at night and 2L during day). He walked without RW 200 ft x 2 (standing rest break between) on 3L with sats >89% throughout. He was unsteady with drifting left and right (near staggering at times). Ultimately, he agreed to use of RW at home to decr fall risk. Will attempt to see 10/22 prior to discharge to further educate on use of RW (discussed with OT and they also plan to see 10/22 a.m.).    Follow Up Recommendations  No PT follow up;Supervision - Intermittent     Equipment Recommendations  Rolling walker with 5" wheels (now agrees to RW)    Recommendations for Other Services       Precautions / Restrictions Precautions Precautions: Fall    Mobility  Bed Mobility               General bed mobility comments: Received sitting in recliner  Transfers Overall transfer level: Needs assistance Equipment used: None Transfers: Sit to/from Stand Sit to Stand: Supervision         General transfer comment: for safety due to lines; no imbalance  Ambulation/Gait Ambulation/Gait assistance: Min guard;Min assist Gait Distance (Feet): 200 Feet (standing rest; 200 ft) Assistive device:  (pt pushing O2 tank) Gait Pattern/deviations: Step-through pattern;Decreased stride length;Drifts right/left     General Gait Details: pt wanted to see how he would do without RW; due to size of monitor and PT needing one hand on pt's gait belt, pt offered to push O2 caddy. vc for slower pace to maintain O2 levels, however pt then with more  Rt/Lt drifting (?partially due to pushing O2 caddy). cues for wider steps and this did reduce his drifting   Stairs             Wheelchair Mobility    Modified Rankin (Stroke Patients Only)       Balance Overall balance assessment: Needs assistance   Sitting balance-Leahy Scale: Good       Standing balance-Leahy Scale: Fair Standing balance comment: requires support for dynamic balance                            Cognition Arousal/Alertness: Awake/alert Behavior During Therapy: WFL for tasks assessed/performed Overall Cognitive Status: No family/caregiver present to determine baseline cognitive functioning                                 General Comments: most likely at baseline; did not graduate form high school; can read/write "a little"      Exercises      General Comments General comments (skin integrity, edema, etc.): On 3L with sats >89%. vc to decr talking when exerting himself, however pt with difficulty adhering to this. Patient ultimately agreed he would benefit from RW to decrease his fall risk       Pertinent Vitals/Pain Pain Assessment: No/denies pain    Home Living  Prior Function            PT Goals (current goals can now be found in the care plan section) Acute Rehab PT Goals Patient Stated Goal: to get better and go home Time For Goal Achievement: 11/07/19 Potential to Achieve Goals: Good    Frequency    Min 3X/week      PT Plan Current plan remains appropriate    Co-evaluation              AM-PAC PT "6 Clicks" Mobility   Outcome Measure  Help needed turning from your back to your side while in a flat bed without using bedrails?: None Help needed moving from lying on your back to sitting on the side of a flat bed without using bedrails?: None Help needed moving to and from a bed to a chair (including a wheelchair)?: A Little Help needed standing up from a chair  using your arms (e.g., wheelchair or bedside chair)?: None Help needed to walk in hospital room?: A Little Help needed climbing 3-5 steps with a railing? : A Little 6 Click Score: 21    End of Session Equipment Utilized During Treatment: Oxygen Activity Tolerance: Patient tolerated treatment well Patient left: in chair;with call bell/phone within reach;with chair alarm set Nurse Communication: Mobility status PT Visit Diagnosis: Other abnormalities of gait and mobility (R26.89)     Time: 8502-7741 PT Time Calculation (min) (ACUTE ONLY): 28 min  Charges:  $Gait Training: 23-37 mins                      Arby Barrette, PT Pager [phone]    Rexanne Mano 10/25/2019, 4:48 PM

## 2019-10-25 NOTE — Progress Notes (Signed)
Per Dr. [facility]ron [facility] reached out to PT requesting they come and work with the patient this afternoon.  Expected d/c tomorrow (no home health care d/t ins.).  Page and left vm.

## 2019-10-25 NOTE — Plan of Care (Signed)
Pt. Has improved throughout the day.  He has ambulated in his room and several times throughout the unit.  His oxygen levels stay in the low 90's and his gait is stable with his walker.  He states he is looking forward to going home.

## 2019-10-26 LAB — GLUCOSE, CAPILLARY: Glucose-Capillary: 101 mg/dL — ABNORMAL HIGH (ref 70–99)

## 2019-10-26 NOTE — Progress Notes (Signed)
Discussed d/c with pt.  Pt. States he understands and understanding verified through "teach-back method".  Pt. Is ready for d/c

## 2019-10-26 NOTE — Progress Notes (Signed)
CSW faxed over MD signed PCS form to [facility] for review in the event RNCM is unable to secure home health services for patient.   Christie Copley LCSW

## 2019-10-26 NOTE — Plan of Care (Signed)
Pt. Confirmed understanding through "teach back method".  He is ready for d/c.

## 2019-10-26 NOTE — Discharge Instructions (Signed)
Person Under Monitoring Name: James [name]  Location: [place] [address]   Infection Prevention Recommendations for Individuals Confirmed to have, or Being Evaluated for, 2019 Novel Coronavirus (COVID-19) Infection Who Receive Care at Home  Individuals who are confirmed to have, or are being evaluated for, COVID-19 should follow the prevention steps below until a healthcare provider or local or state health department says they can return to normal activities.  Stay home except to get medical care You should restrict activities outside your home, except for getting medical care. Do not go to work, school, or public areas, and do not use public transportation or taxis.  Call ahead before visiting your doctor Before your medical appointment, call the healthcare provider and tell them that you have, or are being evaluated for, COVID-19 infection. This will help the healthcare provider's office take steps to keep other people from getting infected. Ask your healthcare provider to call the local or state health department.  Monitor your symptoms Seek prompt medical attention if your illness is worsening (e.g., difficulty breathing). Before going to your medical appointment, call the healthcare provider and tell them that you have, or are being evaluated for, COVID-19 infection. Ask your healthcare provider to call the local or state health department.  Wear a facemask You should wear a facemask that covers your nose and mouth when you are in the same room with other people and when you visit a healthcare provider. People who live with or visit you should also wear a facemask while they are in the same room with you.  Separate yourself from other people in your home As much as possible, you should stay in a different room from other people in your home. Also, you should use a separate bathroom, if available.  Avoid sharing household items You should not  share dishes, drinking glasses, cups, eating utensils, towels, bedding, or other items with other people in your home. After using these items, you should wash them thoroughly with soap and water.  Cover your coughs and sneezes Cover your mouth and nose with a tissue when you cough or sneeze, or you can cough or sneeze into your sleeve. Throw used tissues in a lined trash can, and immediately wash your hands with soap and water for at least 20 seconds or use an alcohol-based hand rub.  Wash your [facility] your hands often and thoroughly with soap and water for at least 20 seconds. You can use an alcohol-based hand sanitizer if soap and water are not available and if your hands are not visibly dirty. Avoid touching your eyes, nose, and mouth with unwashed hands.   Prevention Steps for Caregivers and Household Members of Individuals Confirmed to have, or Being Evaluated for, COVID-19 Infection Being Cared for in the Home  If you live with, or provide care at home for, a person confirmed to have, or being evaluated for, COVID-19 infection please follow these guidelines to prevent infection:  Follow healthcare provider's instructions Make sure that you understand and can help the patient follow any healthcare provider instructions for all care.  Provide for the patient's basic needs You should help the patient with basic needs in the home and provide support for getting groceries, prescriptions, and other personal needs.  Monitor the patient's symptoms If they are getting sicker, call his or her medical provider and tell them that the patient has, or is being evaluated for, COVID-19 infection. This will help the healthcare provider's  office take steps to keep other people from getting infected. Ask the healthcare provider to call the local or state health department.  Limit the number of people who have contact with the patient  If possible, have only one caregiver for the  patient.  Other household members should stay in another home or place of residence. If this is not possible, they should stay  in another room, or be separated from the patient as much as possible. Use a separate bathroom, if available.  Restrict visitors who do not have an essential need to be in the home.  Keep older adults, very young children, and other sick people away from the patient Keep older adults, very young children, and those who have compromised immune systems or chronic health conditions away from the patient. This includes people with chronic heart, lung, or kidney conditions, diabetes, and cancer.  Ensure good ventilation Make sure that shared spaces in the home have good air flow, such as from an air conditioner or an opened window, weather permitting.  Wash your hands often  Wash your hands often and thoroughly with soap and water for at least 20 seconds. You can use an alcohol based hand sanitizer if soap and water are not available and if your hands are not visibly dirty.  Avoid touching your eyes, nose, and mouth with unwashed hands.  Use disposable paper towels to dry your hands. If not available, use dedicated cloth towels and replace them when they become wet.  Wear a facemask and gloves  Wear a disposable facemask at all times in the room and gloves when you touch or have contact with the patient's blood, body fluids, and/or secretions or excretions, such as sweat, saliva, sputum, nasal mucus, vomit, urine, or feces.  Ensure the mask fits over your nose and mouth tightly, and do not touch it during use.  Throw out disposable facemasks and gloves after using them. Do not reuse.  Wash your hands immediately after removing your facemask and gloves.  If your personal clothing becomes contaminated, carefully remove clothing and launder. Wash your hands after handling contaminated clothing.  Place all used disposable facemasks, gloves, and other waste in a lined  container before disposing them with other household waste.  Remove gloves and wash your hands immediately after handling these items.  Do not share dishes, glasses, or other household items with the patient  Avoid sharing household items. You should not share dishes, drinking glasses, cups, eating utensils, towels, bedding, or other items with a patient who is confirmed to have, or being evaluated for, COVID-19 infection.  After the person uses these items, you should wash them thoroughly with soap and water.  Wash laundry thoroughly  Immediately remove and wash clothes or bedding that have blood, body fluids, and/or secretions or excretions, such as sweat, saliva, sputum, nasal mucus, vomit, urine, or feces, on them.  Wear gloves when handling laundry from the patient.  Read and follow directions on labels of laundry or clothing items and detergent. In general, wash and dry with the warmest temperatures recommended on the label.  Clean all areas the individual has used often  Clean all touchable surfaces, such as counters, tabletops, doorknobs, bathroom fixtures, toilets, phones, keyboards, tablets, and bedside tables, every day. Also, clean any surfaces that may have blood, body fluids, and/or secretions or excretions on them.  Wear gloves when cleaning surfaces the patient has come in contact with.  Use a diluted bleach solution (e.g., dilute bleach with 1  part bleach and 10 parts water) or a household disinfectant with a label that says EPA-registered for coronaviruses. To make a bleach solution at home, add 1 tablespoon of bleach to 1 quart (4 cups) of water. For a larger supply, add  cup of bleach to 1 gallon (16 cups) of water.  Read labels of cleaning products and follow recommendations provided on product labels. Labels contain instructions for safe and effective use of the cleaning product including precautions you should take when applying the product, such as wearing gloves or  eye protection and making sure you have good ventilation during use of the product.  Remove gloves and wash hands immediately after cleaning.  Monitor yourself for signs and symptoms of illness Caregivers and household members are considered close contacts, should monitor their health, and will be asked to limit movement outside of the home to the extent possible. Follow the monitoring steps for close contacts listed on the symptom monitoring form.   ? If you have additional questions, contact your local health department or call the epidemiologist on call at [phone] (available 24/7). ? This guidance is subject to change. For the most up-to-date guidance from [facility], please refer to their website: [email or web address]

## 2019-10-26 NOTE — Discharge Summary (Signed)
James [name], is a 59 y.o. male  DOB March 10, 1960  MRN [id number].  Admission date:  10/19/2019  Admitting Physician  No admitting provider for patient encounter.  Discharge Date:  10/26/2019   Primary MD  [name], MD  Recommendations for primary care physician for things to follow:  -Patient instructed to follow with PCP/pulmonary as an outpatient, appointment will be rescheduled till he is off quarantine as discussed with the patient and staff. -Please check CBC, CMP during next visit.  Admission Diagnosis  COPD exacerbation (HCC) [J44.1] Acute on chronic respiratory failure with hypoxia (HCC) [J96.21] Pneumonia due to COVID-19 virus [U07.1, J12.82]   Discharge Diagnosis  COPD exacerbation ([place]) [J44.1] Acute on chronic respiratory failure with hypoxia (HCC) [J96.21] Pneumonia due to COVID-19 virus [U07.1, J12.82]    Active Problems:   Acute on chronic respiratory failure with hypoxia (HCC)   Pneumonia due to COVID-19 virus      Past Medical History:  Diagnosis Date  . Acute on chronic respiratory failure with hypoxia ([place]) 06/08/2013  . CAP (community acquired pneumonia) 05/17/2018   See admit 05/17/18 ? rml  with   covid pcr neg - rx augmentin > f/u cxr in 4-6 weeks is fine unless condition declines   . Community acquired pneumonia of right lower lobe of lung 05/17/2018   See admit 05/17/18 ? rml  with   covid pcr neg - rx augmentin > f/u cxr in 4-6 weeks is fine unless condition declines   . COPD (chronic obstructive pulmonary disease) ([place])    not on home  . Diabetes ([place])   . Dyspnea   . Hypertension   . Pneumonia 04/06/2016  . Pneumothorax    2016, fell from ladder  . 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 Tick bite 06/03/2018    Past Surgical History:  Procedure Laterality Date  . VIDEO ASSISTED THORACOSCOPY (VATS)/THOROCOTOMY Left 06/11/2013   Procedure: VIDEO ASSISTED  THORACOSCOPY (VATS)/THOROCOTOMY;  Surgeon: Ivin Poot, MD;  Location: [facility];  Service: Thoracic;  Laterality: Left;  [name] VIDEO BRONCHOSCOPY N/A 06/11/2013   Procedure: VIDEO BRONCHOSCOPY;  Surgeon: Ivin Poot, MD;  Location: [facility];  Service: Thoracic;  Laterality: N/A;  . VIDEO BRONCHOSCOPY N/A 08/10/2018   Procedure: VIDEO BRONCHOSCOPY WITH FLUORO;  Surgeon: Candee Furbish, MD;  Location: [facility] ENDOSCOPY;  Service: Endoscopy;  Laterality: N/A;       History of present illness and  Hospital Course:     Kindly see H&P for history of present illness and admission details, please review complete Labs, Consult reports and Test reports for all details in brief  HPI  from the history and physical done on the day of admission by PCCM on 10/19/2019  Pt is encephelopathic; therefore, this HPI is obtained from chart review. James [name] is a 59 y.o. male who has a PMH as outlined below.  He presented to [facility] ED 10/15 with significant dyspnea to the point that he could not speak in full sentences.  He was placed  on CPAP but did not respond; therefore, he was intubated.  After intubation, COVID returned positive.  He has been evaluated by pulmonary on prior admissions given recurrent PNA.  He has bronchoscopy in Aug 2020 which showed severe bronchiectasis bilaterally, neutrophil predominance and transbronchial biopsies were benign.   Hospital Course   James [name] a 59 y.o.malewho has a PMH of chronic respiratory failure, COPD, diabetes, hypertension, presented to [facility] ED 10/15 with significant dyspnea to the point that he could not speak in full sentences. He was placed on CPAP but did not respond; therefore, he was intubated.After intubation, COVID returned positive. He has been evaluated by pulmonary on prior admissions given recurrent PNA. He has bronchoscopy in Aug 2020 which showed severe bronchiectasis bilaterally, neutrophil predominance and transbronchial biopsies were benign.   Patient was successfully extubated 10/17, and he was transferred [facility] on 10/19.   Acute on chronic hypoxic Resp. Failure due to Acute Covid 19 Viral Pneumonitis during the ongoing 2020 Covid 19 Pandemic  -Unvaccinated, he is a critically ill on presentation required intubation.  He was successfully extubated after 48 hours, with increased oxygen requirement initially, he is currently back at his baseline oxygen requirement, report requiring 2 L at rest, 4 L with activity. -He is extubated 10/17. -Treated initially with IV steroids, he will be discharged on home dose prednisone 20 mg oral daily (this was confirmed with with the patient that he is at current dose, he was given a refill for next 30 days till he is seen by PCP) -He was treated with remdesivir during hospital stay, as well he was treated with baricitinib as well -Was encouraged to take his incentive spirometry and flutter valve at home and continue using them .   COPD exacerbation -Treated with antibiotics for 5 days, continue with bronchodilators, continue with steroid  .  Hypertension -Continue with home dose metoprolol, and Cozaar  Diabetes mellitus -We will hold home dose Metformin, will keep on sliding scale during hospital stay,  A1c is 6.8  History of alcohol abuse -No evidence of withdrawal during hospital stay, continue with thiamine and folic acid on discharge  Discharge Condition:  stable   Follow UP   Follow-up Information    [name], MD Follow up on 11/06/2019.   Specialty: Pulmonary Disease Why: November 2  3pm- However they may reach out to you and reschedule for another date a little further away, they will call Contact information: [address] [place] [place] [address] [phone]                 Discharge Instructions  and  Discharge Medications   Discharge Instructions    Discharge instructions   Complete by: As directed    Follow with Primary MD [name], MD [facility]  Get CBC, CMP, 2 view Chest X ray checked  by Primary MD next visit.    Activity: As tolerated with Full fall precautions use walker/cane & assistance as needed   Disposition Home    Diet: Heart Healthy /Carb modified , with feeding assistance and aspiration precautions.  For Heart failure patients - Check your Weight same time everyday, if you gain over 2 pounds, or you develop in leg swelling, experience more shortness of breath or chest pain, call your Primary MD immediately. Follow Cardiac Low Salt Diet and 1.5 lit/day fluid restriction.   On your next visit with your primary care physician please Get Medicines reviewed and adjusted.  Please request your Prim.MD to go over all Hospital Tests and Procedure/Radiological results at the follow up, please get all Hospital records sent to your Prim MD by signing hospital release before you go home.   If you experience worsening of your admission symptoms, develop shortness of breath, life threatening emergency, suicidal or homicidal thoughts you must seek medical attention immediately by calling 911 or calling your MD immediately  if symptoms less severe.  You Must read complete instructions/literature along with all the possible adverse reactions/side effects for all the Medicines you take and that have been prescribed to you. Take any new Medicines after you have completely understood and accpet all the possible adverse reactions/side effects.   Do not drive, operating heavy machinery, perform activities at heights, swimming or participation in water activities or provide baby sitting services if your were admitted for syncope or siezures until you have seen by Primary MD or a Neurologist and advised to do so again.  Do not drive when taking Pain medications.    Do not take more than prescribed Pain, Sleep and Anxiety Medications  Special Instructions: If you have smoked or chewed Tobacco  in the last 2  yrs please stop smoking, stop any regular Alcohol  and or any Recreational drug use.  Wear Seat belts while driving.   Please note  You were cared for by a hospitalist during your hospital stay. If you have any questions about your discharge medications or the care you received while you were in the hospital after you are discharged, you can call the unit and asked to speak with the hospitalist on call if the hospitalist that took care of you is not available. Once you are discharged, your primary care physician will handle any further medical issues. Please note that NO REFILLS for any discharge medications will be authorized once you are discharged, as it is imperative that you return to your primary care physician (or establish a relationship with a primary care physician if you do not have one) for your aftercare needs so that they can reassess your need for medications and monitor your lab values.   Increase activity slowly   Complete by: As directed      Allergies as of 10/26/2019      Reactions   Gabapentin Other (See Comments)   hallucinations      Medication List    TAKE these medications   Accu-Chek Softclix Lancets lancets Use as instructed to check blood sugar once daily. What changed:   how much to take  how to take this  when to take this   albuterol 108 (90 Base) MCG/ACT inhaler Commonly known as: ProAir HFA Inhale 2 puffs into the lungs every 6 (six) hours as needed for wheezing or shortness of breath.   albuterol (2.5 MG/3ML) 0.083% nebulizer solution Commonly known as: PROVENTIL Take 3 mLs (2.5 mg total) by nebulization every 6 (six) hours as needed for wheezing or shortness of breath.   Flutter Devi Use 4 times daily What changed:   how much to take  how to take this  when to take this   folic acid 1 MG tablet Commonly known as: FOLVITE Take 1 tablet (1 mg total) by mouth daily.   furosemide 20 MG tablet Commonly known as: LASIX Take 1 tablet  (20 mg total) by mouth daily as needed for edema (lower extremity). What changed: reasons to take this   losartan 100 MG tablet Commonly known as: COZAAR Take 1 tablet (  100 mg total) by mouth daily.   metFORMIN 500 MG tablet Commonly known as: GLUCOPHAGE Take 1 tablet (500 mg total) by mouth 2 (two) times daily with a meal.   metoprolol succinate 25 MG 24 hr tablet Commonly known as: TOPROL-XL Take 1 tablet (25 mg total) by mouth daily.   mometasone-formoterol 200-5 MCG/ACT Aero Commonly known as: DULERA Inhale 2 puffs into the lungs 2 (two) times daily.   pantoprazole 40 MG tablet Commonly known as: PROTONIX Take 1 tablet (40 mg total) by mouth daily.   predniSONE 20 MG tablet Commonly known as: DELTASONE Take 1 tablet (20 mg total) by mouth daily with breakfast. What changed: how much to take   Spiriva Respimat 2.5 MCG/ACT Aers Generic drug: Tiotropium Bromide Monohydrate Two puff daily What changed:   how much to take  how to take this  when to take this  additional instructions   thiamine 100 MG tablet Commonly known as: Vitamin B-1 Take 1 tablet (100 mg total) by mouth daily.   True Metrix Blood Glucose Test test strip Generic drug: glucose blood Use as instructed What changed:   how much to take  how to take this  when to take this   True Metrix Meter w/Device Kit Use to measure blood sugar twice a day What changed:   how much to take  how to take this  when to take this   Accu-Chek Guide Me w/Device Kit 1 each by Other route in the morning and at bedtime. What changed: Another medication with the same name was changed. Make sure you understand how and when to take each.         Diet and Activity recommendation: See Discharge Instructions above   Consults obtained -PCCM   Major procedures and Radiology Reports - PLEASE review detailed and final reports for all details, in brief -     DG Abdomen 1 View  Result Date:  10/19/2019 CLINICAL DATA:  Nasogastric tube placement EXAM: ABDOMEN - 1 VIEW COMPARISON:  02/19/2019 FINDINGS: Nasogastric tube is seen looped within the gastric lumen. The visualized gas pattern is unremarkable. Pelvis excluded from view. IMPRESSION: Nasogastric tube looped within the gastric lumen. Electronically Signed   By: Fidela Salisbury MD   On: 10/19/2019 03:15   DG CHEST PORT 1 VIEW  Result Date: 10/20/2019 CLINICAL DATA:  Respiratory failure EXAM: PORTABLE CHEST 1 VIEW COMPARISON:  10/20/2019 FINDINGS: Endotracheal tube is 4.3 cm above the carina. NG tube is in the stomach. Bibasilar atelectasis or infiltrates. Heart is normal size. No effusions. No acute bony abnormality. IMPRESSION: Increasing bibasilar atelectasis or infiltrates. Electronically Signed   By: Rolm Baptise M.D.   On: 10/20/2019 21:58   DG Chest Port 1 View  Result Date: 10/20/2019 CLINICAL DATA:  Respiratory failure EXAM: PORTABLE CHEST 1 VIEW COMPARISON:  10/19/2019 chest radiographs and prior. FINDINGS: Endotracheal tube tip at the level of the clavicular heads, unchanged. Partially imaged enteric tube. Right predominant patchy bibasilar opacities are unchanged. No pneumothorax or large effusion. Blunting of the left costophrenic sulcus, unchanged. Cardiomediastinal silhouette within normal limits. Multilevel bilateral chronic rib deformities. IMPRESSION: Stable support lines and tubes. Minimal right predominant patchy bibasilar opacities, unchanged. Electronically Signed   By: Primitivo Gauze M.D.   On: 10/20/2019 07:38   DG Chest Port 1 View  Result Date: 10/19/2019 CLINICAL DATA:  Dyspnea, respiratory failure EXAM: PORTABLE CHEST 1 VIEW COMPARISON:  09/22/2019 FINDINGS: Endotracheal tube is seen at the level of the clavicular heads  7.5 cm above the carina. Nasogastric tube extends into the upper abdomen beyond the margin of the examination. Subtle airspace infiltrate within the right lung base is improved since  remote prior examinations, though persists. Stable left pleural thickening at the costophrenic angle. No pneumothorax or pleural effusion. Cardiac size within normal limits. Multiple healed left rib fractures are noted. IMPRESSION: Endotracheal tube at the level of the clavicular heads. Improved right basilar airspace infiltrate. Electronically Signed   By: Fidela Salisbury MD   On: 10/19/2019 03:14   ECHOCARDIOGRAM COMPLETE  Result Date: 10/19/2019    ECHOCARDIOGRAM REPORT   Patient Name:   James [name] Date of Exam: 10/19/2019 Medical Rec #:  [id number]      Height:       69.0 in Accession #:    [id number]     Weight:       191.4 lb Date of Birth:  1960/03/19      BSA:          2.028 m Patient Age:    56 years       BP:           112/60 mmHg Patient Gender: M              HR:           97 bpm. Exam Location:  Inpatient Procedure: 2D Echo, Cardiac Doppler and Color Doppler Indications:    Elevated troponin. ; R07.9* Chest pain, unspecified  History:        Patient has no prior history of Echocardiogram examinations.                 COPD; Risk Factors:Current Smoker and Diabetes. Covid 19                 positive. Marijuana use. ETOH. Pneumonia.  Sonographer:    Roseanna Rainbow RDCS Referring Phys: [id number] [place]  1. Left ventricular ejection fraction, by estimation, is 60 to 65%. The left ventricle has normal function. The left ventricle has no regional wall motion abnormalities. There is moderate left ventricular hypertrophy. Left ventricular diastolic parameters are indeterminate.  2. Right ventricular systolic function is normal. The right ventricular size is normal. Tricuspid regurgitation signal is inadequate for assessing PA pressure.  3. The mitral valve is normal in structure. No evidence of mitral valve regurgitation. No evidence of mitral stenosis.  4. The aortic valve is grossly normal. Unable to determine aortic valve morphology due to image quality. Aortic valve regurgitation is not  visualized. No aortic stenosis is present.  5. Aortic dilatation noted. There is mild dilatation of the aortic root, measuring 41 mm. FINDINGS  Left Ventricle: Left ventricular ejection fraction, by estimation, is 60 to 65%. The left ventricle has normal function. The left ventricle has no regional wall motion abnormalities. The left ventricular internal cavity size was normal in size. There is  moderate left ventricular hypertrophy. Left ventricular diastolic parameters are indeterminate. Right Ventricle: The right ventricular size is normal. No increase in right ventricular wall thickness. Right ventricular systolic function is normal. Tricuspid regurgitation signal is inadequate for assessing PA pressure. Left Atrium: Left atrial size was normal in size. Right Atrium: Right atrial size was normal in size. Pericardium: There is no evidence of pericardial effusion. Mitral Valve: The mitral valve is normal in structure. No evidence of mitral valve regurgitation. No evidence of mitral valve stenosis. Tricuspid Valve: The tricuspid valve is normal in structure. Tricuspid valve regurgitation is  not demonstrated. No evidence of tricuspid stenosis. Aortic Valve: The aortic valve is grossly normal. Aortic valve regurgitation is not visualized. No aortic stenosis is present. Pulmonic Valve: The pulmonic valve was normal in structure. Pulmonic valve regurgitation is not visualized. No evidence of pulmonic stenosis. Aorta: Aortic dilatation noted. There is mild dilatation of the aortic root, measuring 41 mm. Venous: IVC assessment for right atrial pressure unable to be performed due to mechanical ventilation. The inferior vena cava is dilated in size with less than 50% respiratory variability, suggesting right atrial pressure of 15 mmHg. IAS/Shunts: No atrial level shunt detected by color flow Doppler.  LEFT VENTRICLE PLAX 2D LVIDd:         4.60 cm      Diastology LVIDs:         3.00 cm      LV e' medial:    6.74 cm/s LV PW:          2.30 cm      LV E/e' medial:  14.5 LV IVS:        1.60 cm      LV e' lateral:   12.20 cm/s LVOT diam:     2.20 cm      LV E/e' lateral: 8.0 LV SV:         90 LV SV Index:   44 LVOT Area:     3.80 cm  LV Volumes (MOD) LV vol d, MOD A2C: 74.8 ml LV vol d, MOD A4C: 123.0 ml LV vol s, MOD A2C: 27.6 ml LV vol s, MOD A4C: 49.1 ml LV SV MOD A2C:     47.2 ml LV SV MOD A4C:     123.0 ml LV SV MOD BP:      58.6 ml RIGHT VENTRICLE             IVC RV S prime:     18.50 cm/s  IVC diam: 2.50 cm TAPSE (M-mode): 3.0 cm LEFT ATRIUM             Index       RIGHT ATRIUM           Index LA diam:        3.40 cm 1.68 cm/m  RA Area:     15.00 cm LA Vol (A2C):   21.2 ml 10.46 ml/m RA Volume:   38.80 ml  19.14 ml/m LA Vol (A4C):   40.2 ml 19.83 ml/m LA Biplane Vol: 31.0 ml 15.29 ml/m  AORTIC VALVE LVOT Vmax:   134.00 cm/s LVOT Vmean:  84.100 cm/s LVOT VTI:    0.236 m  AORTA Ao Root diam: 4.10 cm Ao Asc diam:  3.30 cm MITRAL VALVE MV Area (PHT): 4.39 cm     SHUNTS MV Decel Time: 173 msec     Systemic VTI:  0.24 m MV E velocity: 97.90 cm/s   Systemic Diam: 2.20 cm MV A velocity: 120.00 cm/s MV E/A ratio:  0.82 James Kaiser MD Electronically signed by James Kaiser MD Signature Date/Time: 10/19/2019/4:34:20 PM    Final    VAS [place] LOWER EXTREMITY VENOUS (DVT)  Result Date: 10/21/2019  Lower Venous DVT Study Indications: Covid-19.  Comparison Study: No prior study on file Performing Technologist: Sharion Dove RVS  Examination Guidelines: A complete evaluation includes B-mode imaging, spectral Doppler, color Doppler, and power Doppler as needed of all accessible portions of each vessel. Bilateral testing is considered an integral part of a complete examination. Limited examinations for reoccurring indications may  be performed as noted. The reflux portion of the exam is performed with the patient in reverse Trendelenburg.  +---------+---------------+---------+-----------+----------+--------------+ RIGHT     CompressibilityPhasicitySpontaneityPropertiesThrombus Aging +---------+---------------+---------+-----------+----------+--------------+ CFV      Full           Yes      Yes                                 +---------+---------------+---------+-----------+----------+--------------+ SFJ      Full                                                        +---------+---------------+---------+-----------+----------+--------------+ FV Prox  Full                                                        +---------+---------------+---------+-----------+----------+--------------+ FV Mid   Full                                                        +---------+---------------+---------+-----------+----------+--------------+ FV DistalFull                                                        +---------+---------------+---------+-----------+----------+--------------+ PFV      Full                                                        +---------+---------------+---------+-----------+----------+--------------+ POP      Full           Yes      Yes                                 +---------+---------------+---------+-----------+----------+--------------+ PTV      Full                                                        +---------+---------------+---------+-----------+----------+--------------+ PERO     Full                                                        +---------+---------------+---------+-----------+----------+--------------+   +---------+---------------+---------+-----------+----------+--------------+ LEFT     CompressibilityPhasicitySpontaneityPropertiesThrombus Aging +---------+---------------+---------+-----------+----------+--------------+ CFV      Full  Yes      Yes                                 +---------+---------------+---------+-----------+----------+--------------+ SFJ      Full                                                         +---------+---------------+---------+-----------+----------+--------------+ FV Prox  Full                                                        +---------+---------------+---------+-----------+----------+--------------+ FV Mid   Full                                                        +---------+---------------+---------+-----------+----------+--------------+ FV DistalFull                                                        +---------+---------------+---------+-----------+----------+--------------+ PFV      Full                                                        +---------+---------------+---------+-----------+----------+--------------+ POP      Full           Yes      Yes                                 +---------+---------------+---------+-----------+----------+--------------+ PTV      Full                                                        +---------+---------------+---------+-----------+----------+--------------+ PERO     Full                                                        +---------+---------------+---------+-----------+----------+--------------+     Summary: BILATERAL: - No evidence of deep vein thrombosis seen in the lower extremities, bilaterally. -   *See table(s) above for measurements and observations. Electronically signed by Monica Martinez MD on 10/21/2019 at 3:01:02 PM.    Final     Micro Results     Recent Results (from the past 240 hour(s))  Culture, blood (routine x 2)     Status:  None   Collection Time: 10/19/19  2:30 AM   Specimen: BLOOD  Result Value Ref Range Status   Specimen Description BLOOD LEFT ANTECUBITAL  Final   Special Requests   Final    BOTTLES DRAWN AEROBIC AND ANAEROBIC Blood Culture adequate volume   Culture   Final    NO GROWTH 5 DAYS Performed at [facility], [address], [place], [place] [address]    Report Status 10/24/2019 FINAL  Final  Culture, blood  (routine x 2)     Status: None   Collection Time: 10/19/19  2:32 AM   Specimen: BLOOD LEFT HAND  Result Value Ref Range Status   Specimen Description BLOOD LEFT HAND  Final   Special Requests   Final    BOTTLES DRAWN AEROBIC AND ANAEROBIC Blood Culture adequate volume   Culture   Final    NO GROWTH 5 DAYS Performed at [facility], [place] [address], [place], [place] [address]    Report Status 10/24/2019 FINAL  Final  Respiratory Panel by RT PCR (Flu A&B, Covid) - Nasopharyngeal Swab     Status: Abnormal   Collection Time: 10/19/19  2:35 AM   Specimen: Nasopharyngeal Swab  Result Value Ref Range Status   SARS Coronavirus 2 by RT PCR POSITIVE (A) NEGATIVE Final    Comment: RESULT CALLED TO, READ BACK BY AND VERIFIED WITH: K GIBSON RN 10/19/19 0455 JDW (NOTE) SARS-CoV-2 target nucleic acids are DETECTED.  SARS-CoV-2 RNA is generally detectable in upper respiratory specimens  during the acute phase of infection. Positive results are indicative of the presence of the identified virus, but do not rule out bacterial infection or co-infection with other pathogens not detected by the test. Clinical correlation with patient history and other diagnostic information is necessary to determine patient infection status. The expected result is Negative.  Fact Sheet for Patients:  PinkCheek.be  Fact Sheet for Healthcare Providers: GravelBags.it  This test is not yet approved or cleared by the [place] FDA and  has been authorized for detection and/or diagnosis of SARS-CoV-2 by FDA under an Emergency Use Authorization (EUA).  This EUA will remain in effect (meaning this test can be used)  for the duration of  the COVID-19 declaration under Section 564(b)(1) of the Act, 21 U.S.C. section 360bbb-3(b)(1), unless the authorization is terminated or revoked sooner.      Influenza A by PCR NEGATIVE NEGATIVE Final   Influenza B by  PCR NEGATIVE NEGATIVE Final    Comment: (NOTE) The Xpert Xpress SARS-CoV-2/FLU/RSV assay is intended as an aid in  the diagnosis of influenza from Nasopharyngeal swab specimens and  should not be used as a sole basis for treatment. Nasal washings and  aspirates are unacceptable for Xpert Xpress SARS-CoV-2/FLU/RSV  testing.  Fact Sheet for Patients: PinkCheek.be  Fact Sheet for Healthcare Providers: GravelBags.it  This test is not yet approved or cleared by the [place] FDA and  has been authorized for detection and/or diagnosis of SARS-CoV-2 by  FDA under an Emergency Use Authorization (EUA). This EUA will remain  in effect (meaning this test can be used) for the duration of the  Covid-19 declaration under Section 564(b)(1) of the Act, 21  U.S.C. section 360bbb-3(b)(1), unless the authorization is  terminated or revoked. Performed at [facility], [place] [address] [place], [place] [address]   Urine culture     Status: None   Collection Time: 10/19/19  6:49 AM   Specimen: Urine,  Catheterized  Result Value Ref Range Status   Specimen Description URINE, CATHETERIZED  Final   Special Requests NONE  Final   Culture   Final    NO GROWTH Performed at [facility], [address] [place], [place] [address]    Report Status 10/20/2019 FINAL  Final  MRSA PCR Screening     Status: None   Collection Time: 10/19/19  5:01 PM   Specimen: Nasal Mucosa; Nasopharyngeal  Result Value Ref Range Status   MRSA by PCR NEGATIVE NEGATIVE Final    Comment:        The GeneXpert MRSA Assay (FDA approved for NASAL specimens only), is one component of a comprehensive MRSA colonization surveillance program. It is not intended to diagnose MRSA infection nor to guide or monitor treatment for MRSA infections. Performed at [facility], [place] [address], [place], [place] [address]        Today   Subjective:    James [name] today has no headache,no chest abdominal pain,no new weakness tingling or numbness, feels much better wants to go home today.   Objective:   Blood pressure 124/82, pulse 73, temperature 98.1 F (36.7 C), temperature source Oral, resp. rate 20, height 5' 9"  (1.753 m), weight 81.3 kg, SpO2 91 %.   Intake/Output Summary (Last 24 hours) at 10/26/2019 1448 Last data filed at 10/25/2019 1843 Gross per 24 hour  Intake 360 ml  Output 400 ml  Net -40 ml    Exam Awake Alert, Oriented x 3, No new F.N deficits, Normal affect Symmetrical Chest wall movement, Good air movement bilaterally, CTAB RRR,No Gallops,Rubs or new Murmurs, No Parasternal Heave +ve B.Sounds, Abd Soft, Non tender, No rebound -guarding or rigidity. No Cyanosis, Clubbing or edema, chronic skin  Rash.  Data Review   CBC w Diff:  Lab Results  Component Value Date   WBC 17.3 (H) 10/24/2019   HGB 13.9 10/24/2019   HGB 14.9 07/23/2019   HCT 43.0 10/24/2019   HCT 44.3 07/23/2019   PLT 300 10/24/2019   PLT 305 07/23/2019   LYMPHOPCT 24 10/19/2019   MONOPCT 8 10/19/2019   EOSPCT 0 10/19/2019   BASOPCT 0 10/19/2019    CMP:  Lab Results  Component Value Date   NA 139 10/24/2019   NA 141 07/23/2019   K 4.6 10/24/2019   CL 99 10/24/2019   CO2 31 10/24/2019   BUN 31 (H) 10/24/2019   BUN 15 07/23/2019   CREATININE 0.73 10/24/2019   CREATININE 0.79 10/26/2016   PROT 5.0 (L) 10/20/2019   PROT 5.9 (L) 07/23/2019   ALBUMIN 2.8 (L) 10/20/2019   ALBUMIN 4.0 07/23/2019   BILITOT 0.7 10/20/2019   BILITOT <0.2 07/23/2019   ALKPHOS 36 (L) 10/20/2019   AST 18 10/20/2019   ALT 22 10/20/2019   ALT 62 (H) 06/23/2016  .   Total Time in preparing paper work, data evaluation and todays exam - 81 minutes  Phillips Climes M.D on 10/26/2019 at 2:48 PM  [facility]   Office  [phone]

## 2019-10-26 NOTE — Progress Notes (Signed)
Occupational Therapy Treatment Patient Details Name: James [name] MRN: [id number] DOB: [birth year] Today's Date: 10/26/2019    History of present illness 59 y.o. male admitted 10/19/19 with significant dyspnea requiring intubation. Tested (+) COVID-19. ETT 10/15-10/17. PMH includes HTN, COPD, DM, ETOH abuse, cannot read or write (per chart); pt unvaccinated.   OT comments  Pt progressing towards established OT goals and very excited to dc later today. Providing education and handout on theraband exercises and energy conservation techniques for ADLs and IADLs. Pt performing BUE exercises with orange level two theraband; 10x each. Verbalized understanding of EC education and verbalized ways to implement into daily routine. Continue to recommend dc to home and answered all questions in preparation for dc.  SpO2 >88% on RA.   Follow Up Recommendations  No OT follow up;Supervision - Intermittent    Equipment Recommendations  Tub/shower seat    Recommendations for Other Services PT consult    Precautions / Restrictions Precautions Precautions: Fall       Mobility Bed Mobility               General bed mobility comments: Sitting in recliner upon arrival  Transfers Overall transfer level: Needs assistance Equipment used: None Transfers: Sit to/from Stand Sit to Stand: Supervision         General transfer comment: Supervision for safety    Balance Overall balance assessment: Needs assistance Sitting-balance support: No upper extremity supported;Feet supported Sitting balance-Leahy Scale: Good     Standing balance support: No upper extremity supported;During functional activity Standing balance-Leahy Scale: Good                             ADL either performed or assessed with clinical judgement   ADL Overall ADL's : Needs assistance/impaired                                       General ADL Comments: Providing pt with handout and  education on theraband exercises and energy ocnservation for ADLs and IADLs. Pt performing ADLs and fucntional mobiltiy at Supervision level. Pt performing functional mobility in hallway.      Vision       Perception     Praxis      Cognition Arousal/Alertness: Awake/alert Behavior During Therapy: WFL for tasks assessed/performed Overall Cognitive Status: No family/caregiver present to determine baseline cognitive functioning                                 General Comments: pt slightly impulsive and requiring increased cues. However, feel this is pt's baseline.         Exercises Exercises: General Upper Extremity General Exercises - Upper Extremity Shoulder Flexion: Strengthening;Both;10 reps;Seated Shoulder Extension: Strengthening;Both;10 reps;Seated Shoulder Horizontal ABduction: Strengthening;Both;10 reps;Seated Shoulder Horizontal ADduction: Strengthening;Both;10 reps;Seated Elbow Flexion: Strengthening;Both;10 reps;Seated Elbow Extension: Strengthening;Both;10 reps;Seated   Shoulder Instructions       General Comments SpO2 90s with good pleth on RA. During mobility, SpO2 dropping to 80-70s however pleth line poor. Once in sitting, pleth line normalized and SpO2 >88%. HR 100s. RR 20s.    Pertinent Vitals/ Pain       Pain Assessment: No/denies pain  Home Living  Prior Functioning/Environment              Frequency  Min 2X/week        Progress Toward Goals  OT Goals(current goals can now be found in the care plan section)  Progress towards OT goals: Progressing toward goals  Acute Rehab OT Goals Patient Stated Goal: to get better and go home OT Goal Formulation: With patient Time For Goal Achievement: 11/07/19 Potential to Achieve Goals: Good ADL Goals Pt Will Perform Lower Body Bathing: with modified independence;sit to/from stand Pt Will Perform Lower Body Dressing: with  modified independence;sit to/from stand Pt Will Transfer to Toilet: with modified independence;ambulating Additional ADL Goal #1: Ptwill verbalize 3 energy conservatoin strategies with min vc Additional ADL Goal #2: Ptwill verbalize 2 strategies to reduce risk of falls  Plan Discharge plan remains appropriate    Co-evaluation                 AM-PAC OT "6 Clicks" Daily Activity     Outcome Measure   Help from another person eating meals?: None Help from another person taking care of personal grooming?: A Little Help from another person toileting, which includes using toliet, bedpan, or urinal?: A Little Help from another person bathing (including washing, rinsing, drying)?: A Little Help from another person to put on and taking off regular upper body clothing?: A Little Help from another person to put on and taking off regular lower body clothing?: A Little 6 Click Score: 19    End of Session Equipment Utilized During Treatment: Gait belt  OT Visit Diagnosis: Unsteadiness on feet (R26.81);Muscle weakness (generalized) (M62.81);Other symptoms and signs involving cognitive function   Activity Tolerance Patient tolerated treatment well   Patient Left in chair;with call bell/phone within reach   Nurse Communication Mobility status        Time: 0802-2336 OT Time Calculation (min): 24 min  Charges: OT General Charges $OT Visit: 1 Visit OT Treatments $Self Care/Home Management : 8-22 mins $Therapeutic Exercise: 8-22 mins  [place], OTR/L Acute Rehab Pager: [phone] Office: [place] 10/26/2019, 11:51 AM

## 2019-10-26 NOTE — TOC Progression Note (Signed)
Transition of Care [facility]) - Progression Note    Patient Details  Name: James [name] MRN: [id number] Date of Birth: 1960-01-20  Transition of Care [facility]) CM/SW B[place], RN Phone Number: 10/26/2019, 10:04 AM  Clinical Narrative:    Have tried [facility], [facility], [facility], none have accepted for [facility] .Reached out to Primary [facility]W, to inquire about the paramedic program, which he stated he was on before. Called and left a message with his sister James [name] to call back regarding possible discharge today and transportation.    Expected Discharge Plan: [place] Barriers to Discharge: No Barriers Identified  Expected Discharge Plan and Services Expected Discharge Plan: Halfway   Discharge Planning Services: Follow-up appt scheduled   Living arrangements for the past 2 months: Single Family Home Expected Discharge Date: 10/26/19                                     Social Determinants of Health (SDOH) Interventions    Readmission Risk Interventions Readmission Risk Prevention Plan 04/26/2019  Transportation Screening Complete  PCP or Specialist Appt within 3-5 Days Complete  HRI or [place] Complete  Social Work Consult for [place] Planning/Counseling Complete  Palliative Care Screening Not Applicable  Medication Review Press photographer) Complete  Some recent data might be hidden

## 2019-10-27 ENCOUNTER — Telehealth: Payer: Self-pay

## 2019-10-27 NOTE — Telephone Encounter (Signed)
Called the house phone teresa answered. Explained to her that I could not obtain Home health care for James [name], and that I had email Jne B, the case manager at [facility] to see if she could work on paramedic program or had an ideas to further a home health situation along. They will follow up with CHW and have a appointment scheduled on Nov 2.

## 2019-10-29 ENCOUNTER — Ambulatory Visit (HOSPITAL_COMMUNITY)
Admission: EM | Admit: 2019-10-29 | Discharge: 2019-10-29 | Disposition: A | Discharge status: Home / Self Care | Payer: BLUE CROSS/BLUE SHIELD | Attending: Internal Medicine | Admitting: Internal Medicine

## 2019-10-29 ENCOUNTER — Encounter (HOSPITAL_COMMUNITY): Payer: Self-pay | Admitting: Emergency Medicine

## 2019-10-29 ENCOUNTER — Other Ambulatory Visit (HOSPITAL_COMMUNITY): Payer: Self-pay | Admitting: Internal Medicine

## 2019-10-29 ENCOUNTER — Telehealth: Payer: Self-pay

## 2019-10-29 ENCOUNTER — Other Ambulatory Visit: Payer: Self-pay

## 2019-10-29 DIAGNOSIS — L408 Other psoriasis: Secondary | ICD-10-CM | POA: Diagnosis not present

## 2019-10-29 DIAGNOSIS — L299 Pruritus, unspecified: Secondary | ICD-10-CM

## 2019-10-29 MED ORDER — CENTRUM PO CHEW: 1.0000 | CHEWABLE_TABLET | Freq: Every day | ORAL | 2 refills | Status: DC

## 2019-10-29 MED ORDER — HYDROXYZINE HCL 25 MG PO TABS: 25.0000 mg | ORAL_TABLET | Freq: Four times a day (QID) | ORAL | 0 refills | Status: DC

## 2019-10-29 MED ORDER — HYDROXYZINE HCL 25 MG PO TABS
25.0000 mg | ORAL_TABLET | Freq: Three times a day (TID) | ORAL | 0 refills | Status: DC | PRN
Start: 2019-10-29 — End: 2020-02-13

## 2019-10-29 NOTE — Telephone Encounter (Signed)
Opened in error

## 2019-10-29 NOTE — ED Triage Notes (Signed)
patient has a splotchy , itchy rash to extremities, trunk, scalp.  Patient has seen pcp and has an appt with dermatologist on November 3.

## 2019-10-29 NOTE — Telephone Encounter (Signed)
Transition Care Management Unsuccessful Follow-up Telephone Call  Date of discharge and from where:  10/26/2019.[facility]  Attempts:   call placed to patient # [phone], unable to leave a message, voicemail full.  Call placed to his sister, James [name] # [phone], message left with call back requested to this CM.    He has appointment scheduled with [name] 11/06/2019, and it will need to be virtual

## 2019-10-30 ENCOUNTER — Telehealth: Payer: Self-pay

## 2019-10-30 MED FILL — hydrOXYzine HCL 25 MG TABS: 25 | 8 days supply | Qty: 30 | Fill #0

## 2019-10-30 MED FILL — predniSONE 20 MG TABS: 20 | 30 days supply | Qty: 30 | Fill #1

## 2019-10-30 NOTE — ED Provider Notes (Signed)
[facility]    CSN: [id number] Arrival date & time: 10/29/19  1708      History   Chief Complaint Chief Complaint  Patient presents with  . covid positive  . Rash    HPI James [name] is a 59 y.o. male recently discharged from the hospital comes to the urgent care with complaints of an itchy rash on his extremities, scalp and torso.  Says that the rash has been persistent for several weeks.  Patient is scheduled to see a dermatologist on November 3.  He denies any fever or chills.  The main concern he has regarding the rash is the extent of itchiness.  No change in cosmetics or detergents.   HPI  Past Medical History:  Diagnosis Date  . Acute on chronic respiratory failure with hypoxia ([place]) 06/08/2013  . CAP (community acquired pneumonia) 05/17/2018   See admit 05/17/18 ? rml  with   covid pcr neg - rx augmentin > f/u cxr in 4-6 weeks is fine unless condition declines   . Community acquired pneumonia of right lower lobe of lung 05/17/2018   See admit 05/17/18 ? rml  with   covid pcr neg - rx augmentin > f/u cxr in 4-6 weeks is fine unless condition declines   . COPD (chronic obstructive pulmonary disease) ([place])    not on home  . Diabetes ([place])   . Dyspnea   . Hypertension   . Pneumonia 04/06/2016  . Pneumothorax    2016, fell from ladder  . 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 Tick bite 06/03/2018    Patient Active Problem List   Diagnosis Date Noted  . Pneumonia due to COVID-19 virus 10/19/2019  . Acute encephalopathy   . Bronchiectasis ([place]) 09/24/2019  . Type 2 diabetes mellitus without complication, without long-term current use of insulin ([place]) 07/24/2019  . 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 Other atopic dermatitis 05/01/2019  . Chronic respiratory failure with hypoxia ([place]) 04/23/2019  . Intellectual disability 04/23/2019  . 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 Marijuana abuse 08/09/2018  . COPD exacerbation ([place]) 02/16/2018  . Hypertension  05/17/2016  . Low back pain radiating to both legs 04/18/2016  . History of tobacco abuse 04/06/2016  . Alcohol use disorder, moderate, dependence ([place]) 04/06/2016  . COPD mixed type ([place]) 03/26/2016  . Acute on chronic respiratory failure with hypoxia ([place]) 06/08/2013    Past Surgical History:  Procedure Laterality Date  . VIDEO ASSISTED THORACOSCOPY (VATS)/THOROCOTOMY Left 06/11/2013   Procedure: VIDEO ASSISTED THORACOSCOPY (VATS)/THOROCOTOMY;  Surgeon: Ivin Poot, MD;  Location: [place];  Service: Thoracic;  Laterality: Left;  [name] VIDEO BRONCHOSCOPY N/A 06/11/2013   Procedure: VIDEO BRONCHOSCOPY;  Surgeon: Ivin Poot, MD;  Location: [place];  Service: Thoracic;  Laterality: N/A;  . VIDEO BRONCHOSCOPY N/A 08/10/2018   Procedure: VIDEO BRONCHOSCOPY WITH FLUORO;  Surgeon: Candee Furbish, MD;  Location: [facility];  Service: Endoscopy;  Laterality: N/A;       Home Medications    Prior to Admission medications   Medication Sig Start Date End Date Taking? Authorizing Provider  albuterol (PROAIR HFA) 108 (90 Base) MCG/ACT inhaler Inhale 2 puffs into the lungs every 6 (six) hours as needed for wheezing or shortness of breath. 08/23/19  Yes [name], MD  albuterol (PROVENTIL) (2.5 MG/3ML) 0.083% nebulizer solution Take 3 mLs (2.5 mg total) by nebulization every 6 (six) hours as needed for wheezing or shortness of breath. 10/02/19  Yes [name],  MD  folic acid (FOLVITE) 1 MG tablet Take 1 tablet (1 mg total) by mouth daily. 10/02/19  Yes [name], MD  furosemide (LASIX) 20 MG tablet Take 1 tablet (20 mg total) by mouth daily as needed for edema (lower extremity). Patient taking differently: Take 20 mg by mouth daily as needed for fluid or edema.  10/05/19  Yes [name], MD  losartan (COZAAR) 100 MG tablet Take 1 tablet (100 mg total) by mouth daily. 10/02/19  Yes [name], MD  metFORMIN (GLUCOPHAGE) 500 MG tablet Take 1 tablet (500 mg total) by mouth 2  (two) times daily with a meal. 10/02/19 12/01/19 Yes [name], MD  metoprolol succinate (TOPROL-XL) 25 MG 24 hr tablet Take 1 tablet (25 mg total) by mouth daily. 07/23/19  Yes [name], MD  mometasone-formoterol (DULERA) 200-5 MCG/ACT AERO Inhale 2 puffs into the lungs 2 (two) times daily. 10/02/19  Yes [name], MD  pantoprazole (PROTONIX) 40 MG tablet Take 1 tablet (40 mg total) by mouth daily. 10/02/19  Yes [name], MD  predniSONE (DELTASONE) 20 MG tablet Take 1 tablet (20 mg total) by mouth daily with breakfast. Patient taking differently: Take 40 mg by mouth daily with breakfast.  10/02/19  Yes [name], MD  thiamine (VITAMIN B-1) 100 MG tablet Take 1 tablet (100 mg total) by mouth daily. 10/02/19  Yes [name], MD  Tiotropium Bromide Monohydrate (SPIRIVA RESPIMAT) 2.5 MCG/ACT AERS Two puff daily Patient taking differently: Inhale 2 puffs into the lungs daily.  08/23/19  Yes [name], MD  Accu-Chek Softclix Lancets lancets Use as instructed to check blood sugar once daily. Patient taking differently: 1 each by Other route See admin instructions. Use as instructed to check blood sugar once daily. 08/15/19   [name], MD  Blood Glucose Monitoring Suppl (ACCU-CHEK GUIDE ME) w/Device KIT 1 each by Other route in the morning and at bedtime.  08/15/19   [provider]  Blood Glucose Monitoring Suppl (TRUE METRIX METER) w/Device KIT Use to measure blood sugar twice a day Patient taking differently: 1 each by Other route See admin instructions. Use to measure blood sugar twice a day 08/14/19   [name], MD  glucose blood (TRUE METRIX BLOOD GLUCOSE TEST) test strip Use as instructed Patient taking differently: 1 each by Other route as directed. Use as instructed 08/14/19   [name], MD  hydrOXYzine (ATARAX/VISTARIL) 25 MG tablet Take 1 tablet (25 mg total) by mouth every 8 (eight) hours as needed for anxiety or  itching. 10/29/19   LampteyMyrene Galas, MD  multivitamin-iron-minerals-folic acid (CENTRUM) chewable tablet Chew 1 tablet by mouth daily. 10/29/19   LampteyMyrene Galas, MD  Respiratory Therapy Supplies (FLUTTER) DEVI Use 4 times daily Patient taking differently: 1 each by Other route See admin instructions. Use 4 times daily 04/27/18   [name], MD    Family History Family History  Problem Relation Age of Onset  . Heart disease Father   . COPD Sister     Social History Social History   Tobacco Use  . Smoking status: Former Smoker    Packs/day: 1.50    Years: 40.00    Pack years: 60.00    Types: Cigarettes    Quit date: 2014    Years since quitting: 7.8  . Smokeless tobacco: Current User    Types: Chew  Vaping Use  . Vaping Use:  Never used  Substance Use Topics  . Alcohol use: Yes    Alcohol/week: 28.0 standard drinks    Types: 28 Cans of beer per week    Comment: 4 beers a night; + jitters with not drinking, denies DTs or seizures  . Drug use: Yes    Types: Marijuana    Comment: daily; quit using crack and methamphetamine about 5 months ago      Allergies   Gabapentin   Review of Systems Review of Systems  Constitutional: Negative.   HENT: Negative.   Musculoskeletal: Negative.  Negative for arthralgias, joint swelling and myalgias.  Skin: Positive for color change and rash. Negative for wound.     Physical Exam Triage Vital Signs ED Triage Vitals  Enc Vitals Group     BP 10/29/19 1913 95/60     Pulse Rate 10/29/19 1913 79     Resp 10/29/19 1913 20     Temp 10/29/19 1913 98.2 F (36.8 C)     Temp Source 10/29/19 1913 Oral     SpO2 10/29/19 1913 92 %     Weight --      Height --      Head Circumference --      Peak Flow --      Pain Score 10/29/19 1910 0     Pain Loc --      Pain Edu? --      Excl. in [place]? --    No data found.  Updated Vital Signs BP 95/60 (BP Location: Right Arm)   Pulse 79   Temp 98.2 F (36.8 C) (Oral)   Resp 20    SpO2 92%   Visual Acuity Right Eye Distance:   Left Eye Distance:   Bilateral Distance:    Right Eye Near:   Left Eye Near:    Bilateral Near:     Physical Exam Vitals and nursing note reviewed.  Constitutional:      General: He is not in acute distress.    Appearance: He is not ill-appearing.  Cardiovascular:     Rate and Rhythm: Normal rate and regular rhythm.  Pulmonary:     Effort: Pulmonary effort is normal.     Breath sounds: Normal breath sounds.  Skin:    Capillary Refill: Capillary refill takes less than 2 seconds.     Comments: Extensive rash over the upper, lower extremities and torso.  Rashes is mildly erythematous with silvery scales overlying it.  Neurological:     Mental Status: He is alert.      UC Treatments / Results  Labs (all labs ordered are listed, but only abnormal results are displayed) Labs Reviewed - No data to display  EKG   Radiology No results found.  Procedures Procedures (including critical care time)  Medications Ordered in UC Medications - No data to display  Initial Impression / Assessment and Plan / UC Course  I have reviewed the triage vital signs and the nursing notes.  Pertinent labs & imaging results that were available during my care of the patient were reviewed by me and considered in my medical decision making (see chart for details).     1.Psoriasis with severe itching: Hydroxyzine as needed for itching Patient is encouraged to follow-up with dermatology given the extensive nature of his psoriatic lesions Return precautions given Is no indication of cellulitis.  No antibiotics indicated at this time. Final Clinical Impressions(s) / UC Diagnoses   Final diagnoses:  Psoriasis universalis  Pruritic dermatitis  Discharge Instructions   None    ED Prescriptions    Medication Sig Dispense Auth. Provider   hydrOXYzine (ATARAX/VISTARIL) 25 MG tablet  (Status: Discontinued) Take 1 tablet (25 mg total) by mouth  every 6 (six) hours. 30 tablet Jajaira Ruis, Myrene Galas, MD   hydrOXYzine (ATARAX/VISTARIL) 25 MG tablet Take 1 tablet (25 mg total) by mouth every 8 (eight) hours as needed for anxiety or itching. 30 tablet Tyus Kallam, Myrene Galas, MD   multivitamin-iron-minerals-folic acid (CENTRUM) chewable tablet Chew 1 tablet by mouth daily. 90 tablet Babyboy Loya, Myrene Galas, MD     PDMP not reviewed this encounter.   Chase Picket, MD 10/30/19 1530

## 2019-10-30 NOTE — Telephone Encounter (Signed)
Transition Care Management Unsuccessful Follow-up Telephone Call  Date of discharge and from where:  10/26/2019,[facility]   Attempts:  2nd Attempt  Reason for unsuccessful TCM follow-up call:  Left voice message - messages left with patient's sister, [name] # [phone] and patient's cell # [phone]  He has a virtual appointment scheduled with [name] 11/06/2019

## 2019-11-06 ENCOUNTER — Encounter: Payer: Self-pay | Admitting: Critical Care Medicine

## 2019-11-06 ENCOUNTER — Other Ambulatory Visit: Payer: Self-pay

## 2019-11-06 ENCOUNTER — Ambulatory Visit: Payer: BLUE CROSS/BLUE SHIELD | Attending: Critical Care Medicine | Admitting: Critical Care Medicine

## 2019-11-06 DIAGNOSIS — U071 COVID-19: Secondary | ICD-10-CM | POA: Diagnosis not present

## 2019-11-06 DIAGNOSIS — L2089 Other atopic dermatitis: Secondary | ICD-10-CM

## 2019-11-06 DIAGNOSIS — J449 Chronic obstructive pulmonary disease, unspecified: Secondary | ICD-10-CM

## 2019-11-06 DIAGNOSIS — E119 Type 2 diabetes mellitus without complications: Secondary | ICD-10-CM

## 2019-11-06 DIAGNOSIS — F1020 Alcohol dependence, uncomplicated: Secondary | ICD-10-CM

## 2019-11-06 DIAGNOSIS — J1282 Pneumonia due to coronavirus disease 2019: Secondary | ICD-10-CM

## 2019-11-06 DIAGNOSIS — J9611 Chronic respiratory failure with hypoxia: Secondary | ICD-10-CM | POA: Diagnosis not present

## 2019-11-06 NOTE — Assessment & Plan Note (Signed)
Patient states he is no longer drinking this time continue thiamine folic acid

## 2019-11-06 NOTE — Assessment & Plan Note (Signed)
Continue Metformin

## 2019-11-06 NOTE — Assessment & Plan Note (Signed)
Atopic dermatitis will need to refer to dermatology

## 2019-11-06 NOTE — Progress Notes (Signed)
WENT TO UC  ON 10/25 PT STILL HAS RASH

## 2019-11-06 NOTE — Assessment & Plan Note (Signed)
Covid pneumonia will need follow-up x-ray

## 2019-11-06 NOTE — Assessment & Plan Note (Signed)
Chronic obstructive lung disease no longer smoking has quit drinking alcohol has associated bronchiectasis continued inhaled medications

## 2019-11-06 NOTE — Progress Notes (Signed)
Subjective:    Patient ID: James [name], male    DOB: 16-Jan-1960, 59 y.o.   MRN: [id number] Virtual Visit via Telephone Note  I connected with James [name] on 11/06/19 at  3:00 PM EDT by telephone and verified that I am speaking with the correct person using two identifiers.   Consent:  I discussed the limitations, risks, security and privacy concerns of performing an evaluation and management service by telephone and the availability of in person appointments. I also discussed with the patient that there may be a patient responsible charge related to this service. The patient expressed understanding and agreed to proceed.  Location of patient: Patient is at home  Location of provider: I am in my office  Persons participating in the televisit with the patient.   No one else on the call    History of Present Illness: 08/23/18 59 y.o.M with advanced Copd   I saw this patient a week ago and prescribed antibiotics and prednisone for what I perceived to be persisting pneumonia.  The patient had a slight improvement but then worsened and came to the emergency room the end of the week and was admitted for 3 days for COPD exacerbation and lower lobe pneumonia.  Chest x-ray did show persistent infiltrates in the lower lobes.  The patient is now discharged and returns to the office for post hospital follow-up.  Below is the discharge summary Dc summary : Admit date: 07/31/2018 Discharge date: 08/01/2018  Time spent: 45 minutes  Recommendations for Outpatient Follow-up:  Patient will be discharged to home.  Patient will need to follow up with primary care provider within one week of discharge.  Patient should continue medications as prescribed.  Patient should follow a heart healthy diet.   Discharge Diagnoses:  Acute on chronic respiratory failure with hypoxia secondary to COPD Exacerbation and pneumonia Essential hypertension  Discharge Condition: Stable  Diet recommendation:  heart healthy  Filed Weights  07/31/18 0231 07/31/18 0602 Weight: 85.3 kg 83.4 kg   History of present illness:  on 07/31/2018 by James [name] a 59 y.o.malewith medical history significant ofCOPD. Patient has had 3 prior admits for COPD / PNA thus far this year, most recently in May.  Patient has been being treated for COPD exacerbation and BLL CAP as outpatient by James [name] with levaquin and prednisone. Just finished levaquin yesterday. Prednisone remained at $RemoveBef'40mg'sWaQqqfacm$  daily with plans to initiate a slow taper.  Despite this, patient developed severe SOB and respiratory distress this evening. EMS called and patient noted to be satting 80% on RA. He is not on chronic O2 for his COPD.  Hospital Course:  Acute on chronic respiratory failure with hypoxia secondary to COPD Exacerbation and pneumonia -Patient noted to have oxygen saturations in the 80s on room air -Chest x-ray noted for pneumonia -Was recently placed on Levaquin and steroids by PCP approximately 1 week ago -On admission, patient continued to have wheezing well into his hospital stay day 1.  Patient currently still has some wheezing however has improved. -Was placed on Solu-Medrol, along with nebulizer treatments, azithromycin and ceftriaxone -Patient did require oxygen upon admission however has been able to ambulate and maintain oxygen saturations in the high 90s on room air. -Will discharge patient with prednisone taper, azithromycin and Ceftin. -Given that patient had oxygen saturations in the 80s on admission, it was thought that he would require several days in the hospital. Patient actually improved quicker than expected.  Essential hypertension -Continue losartan  Since discharge the patient is tried to go back to work but has extreme difficulty in the heat with this.  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  10/05/2018 This patient's not been seen since the end of July he had failed several follow-up visits.  He did see James [name] end of August who did not make major changes in his medication program.  He is to remain on [name] and Incruse.  The patient after being admitted in July got readmitted early August and then again in early September.  Discharge summaries are as noted below.  During the August admission the patient underwent bronchoscopy and had mucous plugging removed the culture showed normal flora Note this patient has not been actively smoking recently Admit date: 08/09/2018 Discharge date: 08/11/2018  Discharge Diagnoses:  Acute on chronic hypoxic respiratory failure/sepsis due to multifocal pneumonia COPD Alcohol/marijuana use Essential hypertension  History of present illness:  on 08/09/2018 by James [name] a 59 y.o.malewith medical history significant ofCOPD and ETOH dependence presenting with respiratory distress.He woke up about 530 this AM and couldn't breathe. This just keeps happening. He felt similarly when hospitalized last week. He has been doing fine until today. No h/o OSA, not on CPAP. Not on home O2. +cough, productive of yellowish sputum. It is sometimes hard to get the sputum up. No fever. +wheezing since his last hospitalization. Quit smoking in 2014. He lives in a non-air-conditioned log cabin.  He was hospitalized from 7/27-28 for acute on chronic respiratory failure associated with COPD exacerbation and PNA. This was his 4th admission this year. He was seen by James [name] on 7/30 with plan for completion of course of Keflex and prednisone, as well as prednisone taper.  Hospital  Course:  Acute on chronic hypoxic respiratory failure/sepsis due to multifocal pneumonia -Was recently hospitalized in late July for pneumonia and COPD exacerbation. Was discharged with antibiotics as well as prednisone taper. Patient followed up with James [name] on 08/03/2018 with completion of Keflex and prednisone as well. -This is his fourth admission this year for similar presentation -Patient presented with cough and shortness of breath -Chest x-rayon admissionfindings consistent with right-sided multifocal pneumonia -CTA chest showed multifocal pneumonia on the right. COPD including emphysema. Negative for PE. -COVID negative -Upon admission, patient had leukocytosis, fever, tachycardia, tachypnea- all resolving  -Noted to be hypoxic 88% on room air in the ED -Urine strep pneumoniaand legionellaantigensnegative -Sputum culture: Few GPC, GVR, rare yeast -Blood cultures show no growth -was placed on vancomycin, Zosyn, Solu-Medrol, supplemental oxygen -S/p bronchoscopy 8/6, pending culture results -CXR obtained after bronch on 8/6: almost complete clearing of the hazy infiltrates in the right lung -Discussed with Dr. Tamala Julian (pulm), recommended prednisone taper starting with prednisone $RemoveBeforeD'40mg'EksHUhEbFqFOMS$  daily x 1 week, taper as an outpatient, along with Augmentin. He will follow up with the patient in one week. Given that infection cleared on xray so quickly, question if this is inflammatory vs infection.  -patient able to ambulate today without oxygen and maintained oxygen saturations in the mid 90s- therefore patient does not qualify for home oxygen -patient  also had a swallow eval- mild aspiration risk  COPD -Continue nebulizer treatments, steroids, inhalers -Incentive spirometry, flutter valve  Alcohol/marijuana use -Discussed cessation -was placed on continue CIWA protocol  Essential hypertension -Continue Cozaar  Consultants PCCM- pulmonology    Procedures Bronchoscopy   Recommendations for Outpatient Follow-up:  1. Follow up with PCP in 1-2 weeks 2. Please obtain BMP/CBC in one week your next doctors visit.  3. Augmentin and azithromycin for 5 days 4. Advised to continue using bronchodilators every 4-6 hours for the next 4-5 days and then transition to as needed. 5. Advised to take his home prednisone, for next 3 days advised to increase it to 40 mg twice daily then resume back is 20 mg daily  Discharge Condition: Stable CODE STATUS: Full code Diet recommendation: 2 g salt   DC summary from 09/11/18  Adm 9/7  D/C 09/13/18 Brief/Interim Summary: Mortimer Fries [name] a 59 y.o.malewith history ofCOPD on prednisone, HTN, alcohol use disorder, marijuana use,tobacco user. Patient presented secondary to dyspnea and found to have evidence of pneumonia and a COPD exacerbation in addition to sepsis physiology on admission.  There is concerns of possible right lower lobe pneumonia therefore started on Unasyn and azithromycin due to concerns of aspiration.  This was transitioned to Augmentin and azithromycin for total of 5 days at time of discharge.  Secondary to infection, he was having COPD exacerbation therefore on bronchodilators.  He started improving and on the day of discharge he was adamant about going home as he was feeling much better.  He was still having abnormal breath sounds and cough therefore I had requested him to stay another day in the hospital but still wanted to go home.  Instructions were given to him take 40 mg of prednisone for next 3 days and then he can resume back his 20 mg daily.  Advised... home bronchodilators periodically every 4-6 hours for next 4-5 days and then transition to as needed. Advised to follow-up with his PCP in about 1 week.   Discharge Diagnoses:  Active Problems:   Hypertension   COPD exacerbation ([place])   Marijuana abuse   Pneumonia   Sepsis ([place])  Acute respiratory distress  secondary to right lower lobe pneumonia, community-acquired/aspiration Acute mild exacerbation of COPD Concern for possible aspiration.  Unasyn and azithromycin transition to Augmentin and azithromycin.  We will give him 5 more day course at the time of discharge 40 mg of prednisone for next 3 days then transition to 20 mg daily which is his home dose Advised to use bronchodilators.3  Alcohol use disorder Counseled to quit drinking alcohol.  Advised to continue thiamine, folate and multivitamin  Weakness -He did well with physical therapy.  Tobacco use Marijuana use Cessation discussed on admission  Consultations:  None  Subjective: Still having some cough and congestion but he is adamant about going home today as he is feeling much better than from admission.  I recommended he stay in the hospital for at least 1 more day to help him feel even better.  He wished to go home. Ambulating in the hallway without additional saturating greater than 90% on room air.   Since discharge this patient went to urgent care on 21 September with an outbreak of shingles over his left lower back.  He is taken a course of valacyclovir and prednisone.  He has severe pain.  He was given gabapentin but this caused hallucinations and then we tried tramadol but this caused severe nausea and vomiting.  Apparently the patient cannot take Tylenol or nonsteroidal anti-inflammatories as well.  The patient states his breathing is been labored.  He still has productive cough of white mucus.  He has no fever.  He has significant back pain because of the shingles. The patient is maintaining the [facility] and Incruse as well as his antihypertensive medications and vitamins     10/12/2018  This patient is seen in return follow-up for acute shingles with postherpetic neuropathy, recent pneumonia, COPD exacerbation, and hypertension.  Patient is markedly improved from the last visit.  He has less cough less shortness  of breath.  He has improved pain around the area of the shingles.  He is taking amitriptyline 25 mg at bedtime  The patient notes less pain.  He does state he had been using the tramadol without nausea or vomiting.  I asked if patient would be interested in the para medicine program and expressed interest in the community para medicine program  11/23/2018 Patient seen in return follow-up and has noted increasing shortness of breath and we had to resume prednisone this patient.  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  He does maintain his inhalers.  He is drinking 4 beers a night.  He is not smoking.  Note the patient continues to live in a home that has standing water under the foundation and in several the rooms and mold on the carpet.  We have connected this patient with legal aid to see if they can assist him in getting into better housing.  The patient's not had previous allergy testing   12/13/2018 The patient notes increased shortness of breath, wheezing and cough productive of thick yellow mucus.  He is using the flutter valve but is not being is productive with this.  He denies chest pain or fever.  He is down to 2 a day on the prednisone.  He is maintaining his inhalers. Apparently there is repair is being done on the patient's home and he is decided to stay in the home  02/27/2019 This patient is seen in return follow-up for COPD.  The patient has significant mold exposure in the home.  There has been some mitigation of this but is not complete.  The patient just was in the urgent care for COPD exacerbation a week ago treated with a course of antibiotics and prednisone.  He is now off prednisone. Patient states the pain in the back has resolved however he is still having increased shortness of breath.  He still having productive cough of thick yellow mucus.  05/01/2019  Since the last OV the patient has been admitted for Copd flare again. Below is the discharge summary Dc  summary:  Admit Date: 04/23/2019 Discharge date: 04/27/2019  Recommendations for Outpatient Follow-up:  1. Follow up with PCP in 1-2 weeks 2. Please obtain CMP/CBC in one week 3. Please repeat chest x-ray in the next 6 to 8 weeks to document resolution of infiltrates, if not may require further work-up.     Brief Narrative: Patient is a39 y.o.malewith history of COPD, intellectual disability, HTN who presented with shortness of breath-found to have acute hypoxic respiratory failure secondary to pneumonia and COPD exacerbation. Patient initially required BiPAP on admission. See below for further details.  Significant events: 4/19>> admit to [facility] for severe hypoxemia requiring BiPAP 4/20>> liberated from BiPAP  Antimicrobial therapy: Rocephin 4/18>>4/23 Zithromax 4/18>>4/22  Microbiology data: Blood culture: 4/19>> negative  Procedures : None  Consults: None  Brief Hospital Course: Acute hypoxic/hypercarbic respiratory  failure secondary to COPD exacerbation and pneumonia:Improved-initially required BiPAP on admission-titrated off BiPAP on 4/20.Now on room air with stable O2 saturations.  Feels much better-treated with Rocephin and Zithromax-we will transition to [facility] for a few more days on discharge.  He will resume his usual inhaler regimen-he will be placed on 20 mg of p.o. prednisone until he is seen by James [name].  PCP to repeat a chest x-ray in 6 to 8 weeks to document resolution of infiltrates-if not he may require further work-up to rule out underlying malignancy.  (Per patient's sister-patient probably has been taking more prednisone than he should be-he is unable to read-and is not able to follow directions)  HTN: BPstable-continue losartan  EtOH abuse:No signs of withdrawal-managed with  Ativan per protocol. Per patient's sister-he drinks around 2-3 bottles of beer on a daily basis.  Intellectual disability:Spoke at length with patient's  sister ([name]) today-patient isa illiterate-and has been living on his own for all his life. His other sister lives a [place] and checks on him daily. Sister acknowledges poor living conditions-but apparently this is the way that patient has lived all his life-and chooses to live this way. 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 Patient is currently managing all his finances. [name] does acknowledge that patient will not let any home health services come to his house-he will not go to SNF. Difficult situation-but even though patient is an illiterate/intellectual disability-he understand that he is somewhat deconditioned from his acute illness-and is in need of some help-he does not want therapy services-he understands the risk of falls from being weak.  This MD had numerous discussions with him regarding his appropriate disposition-all he wants to do is go home-his sister-[name] wants to have him come to her place in [place], [place] for a few weeks-however the patient really does not want to go there-and wants to go home.  I have discussed at length with social worker as well-at this point he is being discharged home at his own request.  His family will keep an eye on him.   Nutrition Problem: Nutrition Problem: Increased nutrient needs Etiology: chronic illness(COPD) Signs/Symptoms: estimated needs Interventions: Ensure Enlive (each supplement provides 350kcal and 20 grams of protein)  Discharge Diagnoses:  Principal Problem:   Acute on chronic respiratory failure with hypoxia and hypercapnia (HCC) Active Problems:   Hypertension   COPD exacerbation ([place])   Community acquired pneumonia of right lower lobe of lung   Marijuana abuse   Intellectual disability  Since discharge the patient has completed his antibiotics for his right lower lobe pneumonia.  He is on tapering prednisone.  He has developed a rash on his arms and face.  The rash is pruritic in nature.  He has been  maintaining his medications as prescribed.   07/23/2019 Since the last visit this patient was readmitted to the hospital between 30 June and 3 July.  Below is a copy of the discharge summary Discharge Diagnosis: 1. Acute hypoxic respiratory 2/2 COPD exacerbation vs pneumonitis 2. GERD 3. EtOH history 4. HTN  Discharge Medications: Allergies as of 07/07/2019     Reactions  Gabapentin Other (See Comments)  hallucinations  Disposition and follow-up:   Mr.Harveer D Eddings was discharged from [facility] in Stable condition.  At the hospital follow up visit please address:  1.  COPD exacerbation: Continue daily Albuterol, Dulera, Spiriva, and prednisone 40 mg daily. Sent home with 1 additional day of azithromycin. Discharged on 2L [place]. F/u allergy  labs.  GERD: Severe reflux noted on esophagram, started on pantoprazole daily, may need GI follow up.   2.  Labs / imaging needed at time of follow-up: None  3.  Pending labs/ test needing follow-up: IgE, allergen panel for mold, hypersensitivity pneumonitis  Note since discharge the patient's breathing is back to baseline.  The patient does have some wheezing and minimal productive cough.  Noticed blood pressures ranged up at this visit.  He is maintaining Dulera Spiriva as before he has been maintained on prednisone 40 mg daily and also discharged on 2 L oxygen.  He no longer is actively working at this time.  Note his mold assay did show a mild hypersensitivity to some yeast.  08/23/2019 Patient returns today in follow-up showing increased shortness of breath.  He continues to work outdoors in the heat and this is exacerbating his condition despite being on 20 mg of prednisone daily.  He notes thick yellow mucus that he is coughing up.  He notes increased wheeze.  He has ran out of his Spiriva        10/02/2019 This patient returns after having been in the hospital again for 4 days with discharge summary as noted  below. Patient returns today with continued shortness of breath and productive cough. Mucus is still thick and yellow and difficult to raise. He has chest tightness. He still drinking 6-7 beers daily. He is no longer smoking. He is now on oxygen 4 L continuous. He has had his Medicaid approved and is now fully declared disabled. 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 The landlord has a complex relationship with this patient. Patient lives rent free and a home on the landlord's property. The home is not in good repair as documented previously.   Date of Admission: 09/22/2019  3:42 AM Date of Discharge: 09/25/2019 Attending Physician: Lenice Pressman, MD PhD  Discharge Diagnosis: 1. COPD exacerbation, underlying bronchiectasis 2. Alcohol use disorder 3. Skin rash    Disposition and follow-up:   Mr.Shivank D Hillyard was discharged from [facility] in Stable condition.  At the hospital follow up visit please address:  1.  Follow up      A. COPD exacerbation, recurrent pneumonias, underlying bronchiectasis - hx of recurrent pneumonia, see hospital course for details on prior workup, follow up with his pulmonologist      B. Alcohol use disorder - had mild withdrawal symptoms, motivated to quit, consider pharmacologic assistance such as naltrexone      C. Skin rash - not improving with triamcinolone, referred to dermatology  2.  Labs / imaging needed at time of follow-up: skin biopsy  3.  Pending labs/ test needing follow-up: none  Follow-up Appointments:  Hospital Course by problem list:  COPDexacerbation Recurrent pneumonia Bronchiectasis on bronchoscopy Presents with increased SOB, but cough baseline and not productive. He has had similar symptoms many times previously. PFTs in April 2021 with FEV1/FVC which was 54.01% of expected. CXR this admission with vague R lower lung field opacity, same location as opacity on prior  CXRs. Has had recurrent pneumonia as far back as 2015. bronchoscopy 08/10/2018 for recurrent PNU demonstrating severe chronic bronchitis and bronchiectasis,neutrophil predominance and transbronchial biopsies with benign lung tissue with inflammation. CT on 07/05/19 with no ILD, no centrally obstructing lesion. Esophagram on 07/06/19 with no stenosis but severe reflux which could be contributing to COPD. Other pulm workup significant for IgE WNL, allergen profile  for mold equivocal for Candida albicans but negative for all others, negative hypersensitivity pneumonitis panel. Has very good outpatient follow up with pulmonology. Is on Dulera, Spiriva, and albuterol and also prednisone 58m daily, states he has been compliant with all these, though at one point earlier this admission denied steroid use. Treated with ceftriaxone, azithromycin, and prednisone initially with rapid improvement in SOB. No fevers this admission, cough remains unproductive so did not culture. Discharged on his home Dulera, Spiriva, albuterol, Prednisone. Discharged with oral azithromycin to complete 5 day course.   Alcohol Use Disorder Reports using 2x 24oz beers per day, which is decreased from prior. No hx of withdrawal seizures. CIWA scores max of 15 indicating mild withdrawal, requiring relatively low amounts of Ativan. On day of discharge had scores of up to 5. Patient is motivated to quit drinking because "I want to live longer." Discharged with PO thiamine.  Skin rash Reports a rash for the past several months on his abdomen and both arms. His PCP has been treating this with triamcinolone, but this has not really helped. Referral provided to dermatology for biopsy/further assessment.  Note the patient's rash on both forearms is still persistent and triamcinolone has not helped  This patient will need a dermatology referral as per discharge summary. Note the patient is still actively drinking alcohol. See shortness of breath  assessment below  11/06/2019 This patient is seen by way of a telephone visit and post hospital.  He was just admitted again for recurrent pneumonia this time with Covid infection.  This patient was intubated was severely ill.  He is now on oxygen 4 L continuous and 40 mg daily of prednisone.  He also has a allergic dermatitis rash.  This has not been responsive to steroids and is quite pruritic despite hydroxyzine when seen recently at urgent care.  I went over with this patient at this visit the need to receive assisted care but he refuses and wishes to stay in his current living arrangement which has very poor environment  Below is a copy of the recent discharge summary  Admission date:  10/19/2019  Admitting Physician  No admitting provider for patient encounter.  Discharge Date:  10/26/2019   Primary MD  WElsie Stain MD  Recommendations for primary care physician for things to follow:  -Patient instructed to follow with PCP/pulmonary as an outpatient, appointment will be rescheduled till he is off quarantine as discussed with the patient and staff. -Please check CBC, CMP during next visit.  Admission Diagnosis  COPD exacerbation (HCC) (J44.1) Acute on chronic respiratory failure with hypoxia (HCC) (J96.21) Pneumonia due to COVID-19 virus (U07.1, J12.82)   Discharge Diagnosis  COPD exacerbation (HCC) (J44.1) Acute on chronic respiratory failure with hypoxia (HCC) (J96.21) Pneumonia due to COVID-19 virus (U07.1, J12.82)    Active Problems:   Acute on chronic respiratory failure with hypoxia (HCC)   Pneumonia due to COVID-19 virus      Past Medical History: Diagnosis Date . Acute on chronic respiratory failure with hypoxia ([place] 06/08/2013 . CAP (community acquired pneumonia) 05/17/2018  See admit 05/17/18 ? rml  with   covid pcr neg - rx augmentin > f/u cxr in 4-6 weeks is fine unless condition declines  . Community acquired pneumonia of right lower lobe of  lung 05/17/2018  See admit 05/17/18 ? rml  with   covid pcr neg - rx augmentin > f/u cxr in 4-6 weeks is fine unless condition declines  . COPD (chronic obstructive pulmonary  disease) ([place])   not on home . Diabetes ([place])  . Dyspnea  . Hypertension  . Pneumonia 04/06/2016 . Pneumothorax   2016, fell from ladder . 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 Tick bite 06/03/2018   Past Surgical History: Procedure Laterality Date . VIDEO ASSISTED THORACOSCOPY (VATS)/THOROCOTOMY Left 06/11/2013  Procedure: VIDEO ASSISTED THORACOSCOPY (VATS)/THOROCOTOMY;  Surgeon: Ivin Poot, MD;  Location: [facility];  Service: Thoracic;  Laterality: Left; [name] VIDEO BRONCHOSCOPY N/A 06/11/2013  Procedure: VIDEO BRONCHOSCOPY;  Surgeon: Ivin Poot, MD;  Location: [facility];  Service: Thoracic;  Laterality: N/A; . VIDEO BRONCHOSCOPY N/A 08/10/2018  Procedure: VIDEO BRONCHOSCOPY WITH FLUORO;  Surgeon: Candee Furbish, MD;  Location: [facility] ENDOSCOPY;  Service: Endoscopy;  Laterality: N/A;      History of present illness and  Hospital Course:     Kindly see H&P for history of present illness and admission details, please review complete Labs, Consult reports and Test reports for all details in brief  HPI  from the history and physical done on the day of admission by PCCM on 10/19/2019  Pt is encephelopathic; therefore, this HPI is obtained from chart review. Bryden Darden [name] a 59 y.o.malewho has a PMH as outlined below.[facility]resented to [facility] ED 10/15 with significant dyspnea to the point that he could not speak in full sentences. He was placed on CPAP but did not respond; therefore, he was intubated.  After intubation, COVID returned positive.  He has been evaluated by pulmonary on prior admissions given recurrent PNA. He has bronchoscopy in Aug 2020 which showed severe bronchiectasis bilaterally, neutrophil predominance and transbronchial biopsies were benign.   Hospital Course   Linell Shawn  [name] a 59 y.o.malewho has a PMH of chronic respiratory failure, COPD, diabetes, hypertension, presented to [facility] ED 10/15 with significant dyspnea to the point that he could not speak in full sentences. He was placed on CPAP but did not respond; therefore, he was intubated.After intubation, COVID returned positive. He has been evaluated by pulmonary on prior admissions given recurrent PNA. He has bronchoscopy in Aug 2020 which showed severe bronchiectasis bilaterally, neutrophil predominance and transbronchial biopsies were benign. Patient was successfully extubated 10/17, and he was transferred to [facility] on 10/19.   Acute on chronic hypoxic Resp. Failure due to Acute Covid 19 Viral Pneumonitis during the ongoing 2020 Covid 19 Pandemic  -Unvaccinated, he is a critically ill on presentation required intubation.  He was successfully extubated after 48 hours, with increased oxygen requirement initially, he is currently back at his baseline oxygen requirement, report requiring 2 L at rest, 4 L with activity. -He is extubated 10/17. -Treated initially with IV steroids, he will be discharged on home dose prednisone 20 mg oral daily (this was confirmed with with the patient that he is at current dose, he was given a refill for next 30 days till he is seen by PCP) -He was treated with remdesivir during hospital stay, as well he was treated with baricitinib as well -Was encouraged to take his incentive spirometry and flutter valve at home and continue using them .   COPD exacerbation -Treated with antibiotics for 5 days, continue with bronchodilators, continue with steroid  .  Hypertension -Continue with home dose metoprolol, and Cozaar  Diabetes mellitus -We will hold home dose Metformin, will keep on sliding scale during hospital stay, A1c is 6.8  History of alcohol abuse -No evidence of withdrawal during hospital stay, continue with thiamine and folic acid on  discharge  Discharge Condition:  stable   Patient states his breathing is back at baseline he states he is no longer drinking alcohol he states his blood sugars have been in the 100 range he complains of his left hand feeling numb and is not able to sleep at night due to agitation     Shortness of Breath This is a chronic problem. The current episode started more than 1 year ago. The problem occurs daily (qhs shortness of breath). The problem has been gradually worsening. Associated symptoms include PND, a rash, sputum production and wheezing. Pertinent negatives include no chest pain, claudication, fever, headaches, hemoptysis, leg pain, leg swelling, orthopnea, sore throat, syncope or vomiting. The symptoms are aggravated by any activity, weather changes, lying flat and occupational exposure. Associated symptoms comments: No dysphagia No gerd . Risk factors include smoking. He has tried beta agonist inhalers, oral steroids and steroid inhalers for the symptoms. The treatment provided moderate relief. His past medical history is significant for COPD and pneumonia. There is no history of asthma, CAD, DVT, a heart failure or PE.    Past Medical History:  Diagnosis Date  . Acute on chronic respiratory failure with hypoxia ([place]) 06/08/2013  . CAP (community acquired pneumonia) 05/17/2018   See admit 05/17/18 ? rml  with   covid pcr neg - rx augmentin > f/u cxr in 4-6 weeks is fine unless condition declines   . Community acquired pneumonia of right lower lobe of lung 05/17/2018   See admit 05/17/18 ? rml  with   covid pcr neg - rx augmentin > f/u cxr in 4-6 weeks is fine unless condition declines   . COPD (chronic obstructive pulmonary disease) ([place])    not on home  . Diabetes ([place])   . Dyspnea   . Hypertension   . Pneumonia 04/06/2016  . Pneumothorax    2016, fell from ladder  . 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 Tick bite 06/03/2018     Family History  Problem Relation Age of Onset   . Heart disease Father   . COPD Sister      Social History   Socioeconomic History  . Marital status: Divorced    Spouse name: Not on file  . Number of children: Not on file  . Years of education: Not on file  . Highest education level: Not on file  Occupational History  . Occupation: Dealer  . Occupation: Painter  Tobacco Use  . Smoking status: Former Smoker    Packs/day: 1.50    Years: 40.00    Pack years: 60.00    Types: Cigarettes    Quit date: 2014    Years since quitting: 7.8  . Smokeless tobacco: Current User    Types: Chew  Vaping Use  . Vaping Use: Never used  Substance and Sexual Activity  . Alcohol use: Yes    Alcohol/week: 28.0 standard drinks    Types: 28 Cans of beer per week    Comment: 4 beers a night; + jitters with not drinking, denies DTs or seizures  . Drug use: Yes    Types: Marijuana    Comment: daily; quit using crack and methamphetamine about 5 months ago   . Sexual activity: Not Currently    Partners: Female  Other Topics Concern  . Not on file  Social History Narrative   Lives alone near [place] Determinants of Health   Financial Resource Strain:   . Difficulty of Paying Living Expenses: Not on file  Food Insecurity:   .  Worried About Programme researcher, broadcasting/film/video in the Last Year: Not on file  . Ran Out of Food in the Last Year: Not on file  Transportation Needs:   . Lack of Transportation (Medical): Not on file  . Lack of Transportation (Non-Medical): Not on file  Physical Activity:   . Days of Exercise per Week: Not on file  . Minutes of Exercise per Session: Not on file  Stress:   . Feeling of Stress : Not on file  Social Connections:   . Frequency of Communication with Friends and Family: Not on file  . Frequency of Social Gatherings with Friends and Family: Not on file  . Attends Religious Services: Not on file  . Active Member of Clubs or Organizations: Not on file  . Attends Banker Meetings: Not on  file  . Marital Status: Not on file  Intimate Partner Violence:   . Fear of Current or Ex-Partner: Not on file  . Emotionally Abused: Not on file  . Physically Abused: Not on file  . Sexually Abused: Not on file     Allergies  Allergen Reactions  . Gabapentin Other (See Comments)    hallucinations     Outpatient Medications Prior to Visit  Medication Sig Dispense Refill  . Accu-Chek Softclix Lancets lancets Use as instructed to check blood sugar once daily. (Patient taking differently: 1 each by Other route See admin instructions. Use as instructed to check blood sugar once daily.) 100 each 2  . albuterol (PROAIR HFA) 108 (90 Base) MCG/ACT inhaler Inhale 2 puffs into the lungs every 6 (six) hours as needed for wheezing or shortness of breath. 8.5 g 2  . albuterol (PROVENTIL) (2.5 MG/3ML) 0.083% nebulizer solution Take 3 mLs (2.5 mg total) by nebulization every 6 (six) hours as needed for wheezing or shortness of breath. 150 mL 0  . Blood Glucose Monitoring Suppl (ACCU-CHEK GUIDE ME) w/Device KIT 1 each by Other route in the morning and at bedtime.     . Blood Glucose Monitoring Suppl (TRUE METRIX METER) w/Device KIT Use to measure blood sugar twice a day (Patient taking differently: 1 each by Other route See admin instructions. Use to measure blood sugar twice a day) 1 kit 0  . folic acid (FOLVITE) 1 MG tablet Take 1 tablet (1 mg total) by mouth daily. 90 tablet 0  . furosemide (LASIX) 20 MG tablet Take 1 tablet (20 mg total) by mouth daily as needed for edema (lower extremity). (Patient taking differently: Take 20 mg by mouth daily as needed for fluid or edema. ) 30 tablet 3  . glucose blood (TRUE METRIX BLOOD GLUCOSE TEST) test strip Use as instructed (Patient taking differently: 1 each by Other route as directed. Use as instructed) 100 each 12  . hydrOXYzine (ATARAX/VISTARIL) 25 MG tablet Take 1 tablet (25 mg total) by mouth every 8 (eight) hours as needed for anxiety or itching. 30  tablet 0  . losartan (COZAAR) 100 MG tablet Take 1 tablet (100 mg total) by mouth daily. 30 tablet 2  . metFORMIN (GLUCOPHAGE) 500 MG tablet Take 1 tablet (500 mg total) by mouth 2 (two) times daily with a meal. 120 tablet 01  . metoprolol succinate (TOPROL-XL) 25 MG 24 hr tablet Take 1 tablet (25 mg total) by mouth daily. 90 tablet 3  . mometasone-formoterol (DULERA) 200-5 MCG/ACT AERO Inhale 2 puffs into the lungs 2 (two) times daily. 13 g 0  . multivitamin-iron-minerals-folic acid (CENTRUM) chewable tablet Chew 1  tablet by mouth daily. 90 tablet 2  . pantoprazole (PROTONIX) 40 MG tablet Take 1 tablet (40 mg total) by mouth daily. 30 tablet 6  . predniSONE (DELTASONE) 20 MG tablet Take 1 tablet (20 mg total) by mouth daily with breakfast. (Patient taking differently: Take 40 mg by mouth daily with breakfast. ) 60 tablet 1  . Respiratory Therapy Supplies (FLUTTER) DEVI Use 4 times daily (Patient taking differently: 1 each by Other route See admin instructions. Use 4 times daily) 1 each 0  . thiamine (VITAMIN B-1) 100 MG tablet Take 1 tablet (100 mg total) by mouth daily. 30 tablet 3  . Tiotropium Bromide Monohydrate (SPIRIVA RESPIMAT) 2.5 MCG/ACT AERS Two puff daily (Patient taking differently: Inhale 2 puffs into the lungs daily. ) 4 g 11   No facility-administered medications prior to visit.     Review of Systems  Constitutional: Negative for fever.  HENT: Negative for sinus pressure, sinus pain, sore throat and trouble swallowing.   Eyes: Negative.   Respiratory: Positive for cough, sputum production, shortness of breath and wheezing. Negative for hemoptysis and chest tightness.   Cardiovascular: Positive for PND. Negative for chest pain, palpitations, orthopnea, claudication, leg swelling and syncope.  Gastrointestinal: Negative for nausea and vomiting.  Genitourinary: Negative.   Musculoskeletal: Positive for back pain.  Skin: Positive for rash.  Neurological: Negative for headaches.   Psychiatric/Behavioral: Negative for hallucinations. The patient is not nervous/anxious and is not hyperactive.        Objective:   Physical Exam There were no vitals filed for this visit. No exam this is a phone visit   Disability eval at [facility]:   Pulmonary Function Test (04/19/2019 1:42 PM EDT) Pulmonary Function Test (04/19/2019 1:42 PM EDT)  Component Value Ref Range Performed At Pathologist Signature  FVC Post 2.51 L [facility]   FEV1 Post 1.55 L [facility]   FEV1/FVC Post 61.87 % [facility]   FEF25-75% Post 0.90 L/s [facility]   PEF Post 3.42 L/s [facility]   MVV Post 42.00 L/min [facility]   FVC Pre 2.92 L [facility]   FEV1 Pre 1.57 L [facility]   FEV1/FVC Pre 54.01 % [facility]   FEF25-75% Pre 0.68 L/s [facility]   PEF Pre 4.14 L/s [facility]   MVV Pre 45.00 L/min [facility]   FVC_LLN 3.73  [facility]   FVC_Z-SCORE -2.84  [facility]   FVC_%PRED 60 % % [facility]   FVC_Z-SCORE -2.84  [facility]   FVC_%PRED 52 % % [facility]   FVC %Chng -14 % % [facility]   FEV1_LLN 2.86  [facility]   FEV1_Z-SCORE -3.78  [facility]   FEV1_%PRED 42 % % [facility]   FEV1_Z-SCORE -3.78  [facility]   FEV1_%PRED 41 % % [facility]   FEV1 %Chng -2 % % [facility]   FEV1/FVC_LLN 66  [facility]   FEV1/FVC %Chng 15 % % [facility]   FEF25-75%_LLN 1.60  [facility]   FEF25-75%_Z-SCORE -3.05  [facility]   FEF25-75%_%PRED 21 % % [facility]    FEF25-75%_Z-SCORE -3.05  [facility]   FEF25-75%_%PRED 28 % % [facility]   FEF25-75% %Chng 33 % % [facility]   PEF_LLN 7.25  [facility]   PEF_%PRED 43 % 36 % % [facility]   PEF %Chng -17 % % [facility]   MVV_LLN 130  [facility]   MVV_%PRED 35 % 32 % % [facility]   MVV %Chng -7 % % [facility]     Media Information    Document Information  Photos    11/02/2018 14:43  Attached To:  James [name]  Source Information  [name], MD  [facility]   Pertinent Labs, Studies, and Procedures:  DG Chest Portable 1 View  Result Date: 09/22/2019 CLINICAL DATA:  Dyspnea. COPD exacerbation. Chest pain and shortness of breath. EXAM: PORTABLE CHEST 1 VIEW COMPARISON:  Cyst 07/05/2019 FINDINGS: Stable cardiomediastinal contours. Chronic blunting of the left costophrenic angle due to pleuroparenchymal scarring. Changes of emphysema noted with asymmetric increase hazy opacity within the right lung base concerning for pneumonia. Numerous remote bilateral rib fracture deformities. IMPRESSION: 1. Increased hazy opacity within the right lung base concerning for pneumonia. 2. Emphysema. Electronically Signed   By: Kerby Moors M.D.   On: 09/22/2019 04:20     BMP Latest Ref Rng & Units 10/24/2019 10/23/2019 10/22/2019  Glucose 70 - 99 mg/dL 130(H) 139(H) 145(H)  BUN 6 - 20 mg/dL 31(H) 34(H) 32(H)  Creatinine 0.61 - 1.24 mg/dL 0.73 0.66 0.70  BUN/Creat Ratio 9 - 20 - - -  Sodium 135 - 145 mmol/L 139 138 142  Potassium 3.5 - 5.1 mmol/L 4.6 4.6 4.9  Chloride 98 - 111 mmol/L 99 99 101  CO2 22 - 32 mmol/L $RemoveB'31 30 30  'gwdJnsPT$ Calcium 8.9 - 10.3 mg/dL 9.4 9.2 9.4   Hepatic Function Panel     Component Value Date/Time   PROT 5.0 (L) 10/20/2019 0050   PROT 5.9 (L) 07/23/2019 0900   ALBUMIN 2.8 (L) 10/20/2019 0050    ALBUMIN 4.0 07/23/2019 0900   AST 18 10/20/2019 0050   ALT 22 10/20/2019 0050   ALT 62 (H) 06/23/2016 1226   ALKPHOS 36 (L) 10/20/2019 0050   BILITOT 0.7 10/20/2019 0050   BILITOT <0.2 07/23/2019 0900   BILIDIR <0.1 10/20/2019 0050   BILIDIR 0.12 04/16/2016 1601   IBILI NOT CALCULATED 10/20/2019 0050   CBC Latest Ref Rng & Units 10/24/2019 10/21/2019 10/21/2019  WBC 4.0 - 10.5 K/uL 17.3(H) 23.8(H) 20.5(H)  Hemoglobin 13.0 - 17.0 g/dL 13.9 13.0 11.0(L)  Hematocrit 39 - 52 % 43.0 40.8 35.8(L)  Platelets 150 - 400 K/uL 300 286 272       Assessment & Plan:  I personally reviewed all images and lab data in the [facility] system as well as any outside material available during this office visit and agree with the  radiology impressions.   Chronic respiratory failure with hypoxia (HCC) Acute respiratory failure with chronic component.  The acute respiratory failure is now resolved patient still on significant oxygen  COPD mixed type (HCC) Chronic obstructive lung disease no longer smoking has quit drinking alcohol has associated bronchiectasis continued inhaled medications  Pneumonia due to COVID-19 virus Covid pneumonia will need follow-up x-ray  Type 2 diabetes mellitus without complication, without long-term current use of insulin (HCC) Continue Metformin  Other atopic dermatitis Atopic dermatitis will need to refer to dermatology  Alcohol use  disorder, moderate, dependence ([place]) Patient states he is no longer drinking this time continue thiamine folic acid   Jakobi was seen today for follow-up.  Diagnoses and all orders for this visit:  Chronic respiratory failure with hypoxia ([place])  COPD mixed type ([place])  Pneumonia due to COVID-19 virus  Type 2 diabetes mellitus without complication, without long-term current use of insulin (HCC)  Other atopic dermatitis  Alcohol use disorder, moderate, dependence ([place])      Follow Up Instructions: Patient knows will be an office  visit on November 19 at 3 PM   I discussed the assessment and treatment plan with the patient. The patient was provided an opportunity to ask questions and all were answered. The patient agreed with the plan and demonstrated an understanding of the instructions.   The patient was advised to call back or seek an in-person evaluation if the symptoms worsen or if the condition fails to improve as anticipated.  I provided 30 minutes of non-face-to-face time during this encounter  including  median intraservice time , review of notes, labs, imaging, medications  and explaining diagnosis and management to the patient .    Asencion Noble, MD

## 2019-11-06 NOTE — Assessment & Plan Note (Signed)
Acute respiratory failure with chronic component.  The acute respiratory failure is now resolved patient still on significant oxygen

## 2019-11-07 DIAGNOSIS — J9621 Acute and chronic respiratory failure with hypoxia: Secondary | ICD-10-CM | POA: Diagnosis not present

## 2019-11-07 DIAGNOSIS — J9601 Acute respiratory failure with hypoxia: Secondary | ICD-10-CM | POA: Diagnosis not present

## 2019-11-07 DIAGNOSIS — F79 Unspecified intellectual disabilities: Secondary | ICD-10-CM | POA: Diagnosis not present

## 2019-11-12 NOTE — Progress Notes (Signed)
Subjective:    Patient ID: James [name], male    DOB: 1960-05-19, 59 y.o.   MRN: [id number]    History of Present Illness: 08/23/18 59 y.o.M with advanced Copd   I saw this patient a week ago and prescribed antibiotics and prednisone for what I perceived to be persisting pneumonia.  The patient had a slight improvement but then worsened and came to the emergency room the end of the week and was admitted for 3 days for COPD exacerbation and lower lobe pneumonia.  Chest x-ray did show persistent infiltrates in the lower lobes.  The patient is now discharged and returns to the office for post hospital follow-up.  Below is the discharge summary Dc summary : Admit date: 07/31/2018 Discharge date: 08/01/2018  Time spent: 45 minutes  Recommendations for Outpatient Follow-up:  Patient will be discharged to home.  Patient will need to follow up with primary care provider within one week of discharge.  Patient should continue medications as prescribed.  Patient should follow a heart healthy diet.   Discharge Diagnoses:  Acute on chronic respiratory failure with hypoxia secondary to COPD Exacerbation and pneumonia Essential hypertension  Discharge Condition: Stable  Diet recommendation: heart healthy  Filed Weights  07/31/18 0231 07/31/18 0602 Weight: 85.3 kg 83.4 kg   History of present illness:  on 07/31/2018 by Dr. Candie Mile [name] a 59 y.o.malewith medical history significant ofCOPD. Patient has had 3 prior admits for COPD / PNA thus far this year, most recently in May.  Patient has been being treated for COPD exacerbation and BLL CAP as outpatient by Dr. Joya [name] with levaquin and prednisone. Just finished levaquin yesterday. Prednisone remained at 28m daily with plans to initiate a slow taper.  Despite this, patient developed severe SOB and respiratory distress this evening. EMS called and patient noted to be satting 80% on RA. He is not on chronic  O2 for his COPD.  Hospital Course:  Acute on chronic respiratory failure with hypoxia secondary to COPD Exacerbation and pneumonia -Patient noted to have oxygen saturations in the 80s on room air -Chest x-ray noted for pneumonia -Was recently placed on Levaquin and steroids by PCP approximately 1 week ago -On admission, patient continued to have wheezing well into his hospital stay day 1.  Patient currently still has some wheezing however has improved. -Was placed on Solu-Medrol, along with nebulizer treatments, azithromycin and ceftriaxone -Patient did require oxygen upon admission however has been able to ambulate and maintain oxygen saturations in the high 90s on room air. -Will discharge patient with prednisone taper, azithromycin and Ceftin. -Given that patient had oxygen saturations in the 80s on admission, it was thought that he would require several days in the hospital. Patient actually improved quicker than expected.  Essential hypertension -Continue losartan  Since discharge the patient is tried to go back to work but has extreme difficulty in the heat with this.  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  10/05/2018 This patient's not been seen since the end of July he had failed several follow-up visits.  He did see Dr. WShyrl Numbersend of August who did  not make major changes in his medication program.  He is to remain on Dulera and Incruse.  The patient after being admitted in July got readmitted early August and then again in early September.  Discharge summaries are as noted below.  During the August admission the patient underwent bronchoscopy and had mucous plugging  removed the culture showed normal flora Note this patient has not been actively smoking recently Admit date: 08/09/2018 Discharge date: 08/11/2018  Discharge Diagnoses:  Acute on chronic hypoxic respiratory failure/sepsis due to multifocal pneumonia COPD Alcohol/marijuana use Essential hypertension  History of present illness:  on 08/09/2018 by Dr. [name] a 59 y.o.malewith medical history significant ofCOPD and ETOH dependence presenting with respiratory distress.He woke up about 530 this AM and couldn't breathe. This just keeps happening. He felt similarly when hospitalized last week. He has been doing fine until today. No h/o OSA, not on CPAP. Not on home O2. +cough, productive of yellowish sputum. It is sometimes hard to get the sputum up. No fever. +wheezing since his last hospitalization. Quit smoking in 2014. He lives in a non-air-conditioned log cabin.  He was hospitalized from 7/27-28 for acute on chronic respiratory failure associated with COPD exacerbation and PNA. This was his 4th admission this year. He was seen by Dr. Joya [name] on 7/30 with plan for completion of course of Keflex and prednisone, as well as prednisone taper.  Hospital Course:  Acute on chronic hypoxic respiratory failure/sepsis due to multifocal pneumonia -Was recently hospitalized in late July for pneumonia and COPD exacerbation. Was discharged with antibiotics as well as prednisone taper. Patient followed up with Dr. Joya [name] on 08/03/2018 with completion of Keflex and prednisone as well. -This is his fourth admission this year for similar presentation -Patient presented with cough and shortness of breath -Chest x-rayon admissionfindings consistent with right-sided multifocal pneumonia -CTA chest showed multifocal pneumonia on the right. COPD including emphysema. Negative for PE. -COVID negative -Upon admission, patient had leukocytosis, fever, tachycardia, tachypnea-  all resolving  -Noted to be hypoxic 88% on room air in the ED -Urine strep pneumoniaand legionellaantigensnegative -Sputum culture: Few GPC, GVR, rare yeast -Blood cultures show no growth -was placed on vancomycin, Zosyn, Solu-Medrol, supplemental oxygen -S/p bronchoscopy 8/6, pending culture results -CXR obtained after bronch on 8/6: almost complete clearing of the hazy infiltrates in the right lung -Discussed with Dr. Tamala Julian (pulm), recommended prednisone taper starting with prednisone 10m daily x 1 week, taper as an outpatient, along with Augmentin. He will follow up with the patient in one week. Given that infection cleared on xray so quickly, question if this is inflammatory vs infection.  -patient able to ambulate today without oxygen and maintained oxygen saturations in the mid 90s- therefore patient does not qualify for home oxygen -patient also had a swallow eval- mild aspiration risk  COPD -Continue nebulizer treatments, steroids, inhalers -Incentive spirometry, flutter valve  Alcohol/marijuana use -Discussed cessation -was placed on continue CIWA protocol  Essential hypertension -Continue Cozaar  Consultants PCCM- pulmonology   Procedures Bronchoscopy   Recommendations for Outpatient Follow-up:  1. Follow up with PCP in 1-2 weeks 2. Please obtain BMP/CBC in one week your next doctors visit.  3. Augmentin and azithromycin for 5 days 4. Advised to continue using bronchodilators every 4-6 hours for the next 4-5 days and then transition to as needed. 5. Advised to take his home prednisone, for next 3 days advised to increase it to 40 mg twice daily then resume back is 20 mg  daily  Discharge Condition: Stable CODE STATUS: Full code Diet recommendation: 2 g salt   DC summary from 09/11/18  Adm 9/7  D/C 09/13/18 Brief/Interim Summary: Mortimer Fries [name] a 59 y.o.malewith history ofCOPD on prednisone, HTN, alcohol use disorder, marijuana use,tobacco user.  Patient presented secondary to dyspnea and found to have evidence of pneumonia and a COPD exacerbation in addition to sepsis physiology on admission.  There is concerns of possible right lower lobe pneumonia therefore started on Unasyn and azithromycin due to concerns of aspiration.  This was transitioned to Augmentin and azithromycin for total of 5 days at time of discharge.  Secondary to infection, he was having COPD exacerbation therefore on bronchodilators.  He started improving and on the day of discharge he was adamant about going home as he was feeling much better.  He was still having abnormal breath sounds and cough therefore I had requested him to stay another day in the hospital but still wanted to go home.  Instructions were given to him take 40 mg of prednisone for next 3 days and then he can resume back his 20 mg daily.  Advised... home bronchodilators periodically every 4-6 hours for next 4-5 days and then transition to as needed. Advised to follow-up with his PCP in about 1 week.   Discharge Diagnoses:  Active Problems:   Hypertension   COPD exacerbation ([facility])   Marijuana abuse   Pneumonia   Sepsis ([place])  Acute respiratory distress secondary to right lower lobe pneumonia, community-acquired/aspiration Acute mild exacerbation of COPD Concern for possible aspiration.  Unasyn and azithromycin transition to Augmentin and azithromycin.  We will give him 5 more day course at the time of discharge 40 mg of prednisone for next 3 days then transition to 20 mg daily which is his home dose Advised to use bronchodilators.3  Alcohol use disorder Counseled to quit drinking alcohol.  Advised to continue thiamine, folate and multivitamin  Weakness -He did well with physical therapy.  Tobacco use Marijuana use Cessation discussed on admission  Consultations:  None  Subjective: Still having some cough and congestion but he is adamant about going home today as he is feeling much  better than from admission.  I recommended he stay in the hospital for at least 1 more day to help him feel even better.  He wished to go home. Ambulating in the hallway without additional saturating greater than 90% on room air.   Since discharge this patient went to urgent care on 21 September with an outbreak of shingles over his left lower back.  He is taken a course of valacyclovir and prednisone.  He has severe pain.  He was given gabapentin but this caused hallucinations and then we tried tramadol but this caused severe nausea and vomiting.  Apparently the patient cannot take Tylenol or nonsteroidal anti-inflammatories as well.  The patient states his breathing is been labored.  He still has productive cough of white mucus.  He has no fever.  He has significant back pain because of the shingles. The patient is maintaining the [facility] and Incruse as well as his antihypertensive medications and vitamins     10/12/2018  This patient is seen in return follow-up for acute shingles with postherpetic neuropathy, recent pneumonia, COPD exacerbation, and hypertension.  Patient is markedly improved from the last visit.  He has less cough less shortness of breath.  He has improved pain around the area of the shingles.  He is taking amitriptyline 25 mg at bedtime  The patient notes less pain.  He does state he had been using the tramadol without nausea or vomiting.  I asked if patient would be interested in the para medicine program and expressed interest in the community para medicine program  11/23/2018 Patient seen in return follow-up and has noted increasing shortness of breath and we had to resume prednisone this patient.  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  He does maintain his inhalers.  He is drinking 4 beers a night.  He is not smoking.  Note the patient continues to live in a home that has standing water under the foundation and in several the rooms and mold on the carpet.  We have  connected this patient with legal aid to see if they can assist him in getting into better housing.  The patient's not had previous allergy testing   12/13/2018 The patient notes increased shortness of breath, wheezing and cough productive of thick yellow mucus.  He is using the flutter valve but is not being is productive with this.  He denies chest pain or fever.  He is down to 2 a day on the prednisone.  He is maintaining his inhalers. Apparently there is repair is being done on the patient's home and he is decided to stay in the home  02/27/2019 This patient is seen in return follow-up for COPD.  The patient has significant mold exposure in the home.  There has been some mitigation of this but is not complete.  The patient just was in the urgent care for COPD exacerbation a week ago treated with a course of antibiotics and prednisone.  He is now off prednisone. Patient states the pain in the back has resolved however he is still having increased shortness of breath.  He still having productive cough of thick yellow mucus.  05/01/2019  Since the last OV the patient has been admitted for Copd flare again. Below is the discharge summary Dc summary:  Admit Date: 04/23/2019 Discharge date: 04/27/2019  Recommendations for Outpatient Follow-up:  1. Follow up with PCP in 1-2 weeks 2. Please obtain CMP/CBC in one week 3. Please repeat chest x-ray in the next 6 to 8 weeks to document resolution of infiltrates, if not may require further work-up.     Brief Narrative: Patient is a85 y.o.malewith history of COPD, intellectual disability, HTN who presented with shortness of breath-found to have acute hypoxic respiratory failure secondary to pneumonia and COPD exacerbation. Patient initially required BiPAP on admission. See below for further details.  Significant events: 4/19>> admit to [facility] for severe hypoxemia requiring BiPAP 4/20>> liberated from BiPAP  Antimicrobial  therapy: Rocephin 4/18>>4/23 Zithromax 4/18>>4/22  Microbiology data: Blood culture: 4/19>> negative  Procedures : None  Consults: None  Brief Hospital Course: Acute hypoxic/hypercarbic respiratory failure secondary to COPD exacerbation and pneumonia:Improved-initially required BiPAP on admission-titrated off BiPAP on 4/20.Now on room air with stable O2 saturations.  Feels much better-treated with Rocephin and Zithromax-we will transition to [facility] for a few more days on discharge.  He will resume his usual inhaler regimen-he will be placed on 20 mg of p.o. prednisone until he is seen by Dr. Joya [name].  PCP to repeat a chest x-ray in 6 to 8 weeks to document resolution of infiltrates-if not he may require further work-up to rule out underlying malignancy.  (Per patient's sister-patient probably has been taking more prednisone than he should be-he is unable to read-and is not able to follow directions)  HTN: BPstable-continue losartan  EtOH abuse:No  signs of withdrawal-managed with  Ativan per protocol. Per patient's sister-he drinks around 2-3 bottles of beer on a daily basis.  Intellectual disability:Spoke at length with patient's sister ([name]) today-patient isa illiterate-and has been living on his own for all his life. His other sister lives a [place] and checks on him daily. Sister acknowledges poor living conditions-but apparently this is the way that patient has lived all his life-and chooses to live this way. 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 Patient is currently managing all his finances. [name] does acknowledge that patient will not let any home health services come to his house-he will not go to SNF. Difficult situation-but even though patient is an illiterate/intellectual disability-he understand that he is somewhat deconditioned from his acute illness-and is in need of some help-he does not want therapy services-he understands the risk of falls from  being weak.  This MD had numerous discussions with him regarding his appropriate disposition-all he wants to do is go home-his sister-[name] wants to have him come to her place in [place], [place] for a few weeks-however the patient really does not want to go there-and wants to go home.  I have discussed at length with social worker as well-at this point he is being discharged home at his own request.  His family will keep an eye on him.   Nutrition Problem: Nutrition Problem: Increased nutrient needs Etiology: chronic illness(COPD) Signs/Symptoms: estimated needs Interventions: Ensure Enlive (each supplement provides 350kcal and 20 grams of protein)  Discharge Diagnoses:  Principal Problem:   Acute on chronic respiratory failure with hypoxia and hypercapnia (HCC) Active Problems:   Hypertension   COPD exacerbation ([place])   Community acquired pneumonia of right lower lobe of lung   Marijuana abuse   Intellectual disability  Since discharge the patient has completed his antibiotics for his right lower lobe pneumonia.  He is on tapering prednisone.  He has developed a rash on his arms and face.  The rash is pruritic in nature.  He has been maintaining his medications as prescribed.   07/23/2019 Since the last visit this patient was readmitted to the hospital between 30 June and 3 July.  Below is a copy of the discharge summary Discharge Diagnosis: 1. Acute hypoxic respiratory 2/2 COPD exacerbation vs pneumonitis 2. GERD 3. EtOH history 4. HTN  Discharge Medications: Allergies as of 07/07/2019     Reactions  Gabapentin Other (See Comments)  hallucinations  Disposition and follow-up:   Mr.Yadir D Aeschliman was discharged from [facility] in Stable condition.  At the hospital follow up visit please address:  1.  COPD exacerbation: Continue daily Albuterol, Dulera, Spiriva, and prednisone 40 mg daily. Sent home with 1 additional day of azithromycin.  Discharged on 2L [place]. F/u allergy labs.  GERD: Severe reflux noted on esophagram, started on pantoprazole daily, may need GI follow up.   2.  Labs / imaging needed at time of follow-up: None  3.  Pending labs/ test needing follow-up: IgE, allergen panel for mold, hypersensitivity pneumonitis  Note since discharge the patient's breathing is back to baseline.  The patient does have some wheezing and minimal productive cough.  Noticed blood pressures ranged up at this visit.  He is maintaining Dulera Spiriva as before he has been maintained on prednisone 40 mg daily and also discharged on 2 L oxygen.  He no longer is actively working at this time.  Note his mold assay did show a mild hypersensitivity to some yeast.  08/23/2019 Patient returns today in follow-up showing increased shortness of breath.  He continues to work outdoors in the heat and this is exacerbating his condition despite being on 20 mg of prednisone daily.  He notes thick yellow mucus that he is coughing up.  He notes increased wheeze.  He has ran out of his Spiriva        10/02/2019 This patient returns after having been in the hospital again for 4 days with discharge summary as noted below. Patient returns today with continued shortness of breath and productive cough. Mucus is still thick and yellow and difficult to raise. He has chest tightness. He still drinking 6-7 beers daily. He is no longer smoking. He is now on oxygen 4 L continuous. He has had his Medicaid approved and is now fully declared disabled. 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 The landlord has a complex relationship with this patient. Patient lives rent free and a home on the landlord's property. The home is not in good repair as documented previously.   Date of Admission: 09/22/2019  3:42 AM Date of Discharge: 09/25/2019 Attending Physician: Lenice Pressman, MD PhD  Discharge Diagnosis: 1. COPD exacerbation, underlying  bronchiectasis 2. Alcohol use disorder 3. Skin rash    Disposition and follow-up:   Mr.Pratyush D Mcaffee was discharged from [facility] in Stable condition.  At the hospital follow up visit please address:  1.  Follow up      A. COPD exacerbation, recurrent pneumonias, underlying bronchiectasis - hx of recurrent pneumonia, see hospital course for details on prior workup, follow up with his pulmonologist      B. Alcohol use disorder - had mild withdrawal symptoms, motivated to quit, consider pharmacologic assistance such as naltrexone      C. Skin rash - not improving with triamcinolone, referred to dermatology  2.  Labs / imaging needed at time of follow-up: skin biopsy  3.  Pending labs/ test needing follow-up: none  Follow-up Appointments:  Hospital Course by problem list:  COPDexacerbation Recurrent pneumonia Bronchiectasis on bronchoscopy Presents with increased SOB, but cough baseline and not productive. He has had similar symptoms many times previously. PFTs in April 2021 with FEV1/FVC which was 54.01% of expected. CXR this admission with vague R lower lung field opacity, same location as opacity on prior CXRs. Has had recurrent pneumonia as far back as 2015. bronchoscopy 08/10/2018 for recurrent PNU demonstrating severe chronic bronchitis and bronchiectasis,neutrophil predominance and transbronchial biopsies with benign lung tissue with inflammation. CT on 07/05/19 with no ILD, no centrally obstructing lesion. Esophagram on 07/06/19 with no stenosis but severe reflux which could be contributing to COPD. Other pulm workup significant for IgE WNL, allergen profile for mold equivocal for Candida albicans but negative for all others, negative hypersensitivity pneumonitis panel. Has very good outpatient follow up with pulmonology. Is on Dulera, Spiriva, and albuterol and also prednisone 67m daily, states he has been compliant with all these, though at one point earlier  this admission denied steroid use. Treated with ceftriaxone, azithromycin, and prednisone initially with rapid improvement in SOB. No fevers this admission, cough remains unproductive so did not culture. Discharged on his home Dulera, Spiriva, albuterol, Prednisone. Discharged with oral azithromycin to complete 5 day course.   Alcohol Use Disorder Reports using 2x 24oz beers per day, which is decreased from prior. No hx of withdrawal seizures. CIWA scores max of 15 indicating mild withdrawal, requiring relatively  low amounts of Ativan. On day of discharge had scores of up to 5. Patient is motivated to quit drinking because "I want to live longer." Discharged with PO thiamine.  Skin rash Reports a rash for the past several months on his abdomen and both arms. His PCP has been treating this with triamcinolone, but this has not really helped. Referral provided to dermatology for biopsy/further assessment.  Note the patient's rash on both forearms is still persistent and triamcinolone has not helped  This patient will need a dermatology referral as per discharge summary. Note the patient is still actively drinking alcohol. See shortness of breath assessment below  11/06/2019 This patient is seen by way of a telephone visit and post hospital.  He was just admitted again for recurrent pneumonia this time with Covid infection.  This patient was intubated was severely ill.  He is now on oxygen 4 L continuous and 40 mg daily of prednisone.  He also has a allergic dermatitis rash.  This has not been responsive to steroids and is quite pruritic despite hydroxyzine when seen recently at urgent care.  I went over with this patient at this visit the need to receive assisted care but he refuses and wishes to stay in his current living arrangement which has very poor environment  Below is a copy of the recent discharge summary  Admission date:  10/19/2019  Admitting Physician  No admitting provider for  patient encounter.  Discharge Date:  10/26/2019   Primary MD  [name], MD  Recommendations for primary care physician for things to follow:  -Patient instructed to follow with PCP/pulmonary as an outpatient, appointment will be rescheduled till he is off quarantine as discussed with the patient and staff. -Please check CBC, CMP during next visit.  Admission Diagnosis  COPD exacerbation (HCC) (J44.1) Acute on chronic respiratory failure with hypoxia (HCC) (J96.21) Pneumonia due to COVID-19 virus (U07.1, J12.82)   Discharge Diagnosis  COPD exacerbation (HCC) (J44.1) Acute on chronic respiratory failure with hypoxia (HCC) (J96.21) Pneumonia due to COVID-19 virus (U07.1, J12.82)    Active Problems:   Acute on chronic respiratory failure with hypoxia (HCC)   Pneumonia due to COVID-19 virus      Past Medical History: Diagnosis Date . Acute on chronic respiratory failure with hypoxia ([place]) 06/08/2013 . CAP (community acquired pneumonia) 05/17/2018  See admit 05/17/18 ? rml  with   covid pcr neg - rx augmentin > f/u cxr in 4-6 weeks is fine unless condition declines  . Community acquired pneumonia of right lower lobe of lung 05/17/2018  See admit 05/17/18 ? rml  with   covid pcr neg - rx augmentin > f/u cxr in 4-6 weeks is fine unless condition declines  . COPD (chronic obstructive pulmonary disease) ([place])   not on home . Diabetes ([place])  . Dyspnea  . Hypertension  . Pneumonia 04/06/2016 . Pneumothorax   2016, fell from ladder . 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 Tick bite 06/03/2018   Past Surgical History: Procedure Laterality Date . VIDEO ASSISTED THORACOSCOPY (VATS)/THOROCOTOMY Left 06/11/2013  Procedure: VIDEO ASSISTED THORACOSCOPY (VATS)/THOROCOTOMY;  Surgeon: Ivin Poot, MD;  Location: [place];  Service: Thoracic;  Laterality: Left; [name] VIDEO BRONCHOSCOPY N/A 06/11/2013  Procedure: VIDEO BRONCHOSCOPY;  Surgeon: Ivin Poot, MD;  Location: [place];  Service: Thoracic;  Laterality: N/A; . VIDEO BRONCHOSCOPY N/A 08/10/2018  Procedure: VIDEO BRONCHOSCOPY WITH FLUORO;  Surgeon: Candee Furbish, MD;  Location: [facility] ENDOSCOPY;  Service: Endoscopy;  Laterality:  N/A;      History of present illness and  Hospital Course:     Kindly see H&P for history of present illness and admission details, please review complete Labs, Consult reports and Test reports for all details in brief  HPI  from the history and physical done on the day of admission by PCCM on 10/19/2019  Pt is encephelopathic; therefore, this HPI is obtained from chart review. Daelon Dunivan [name] a 59 y.o.malewho has a PMH as outlined below.Hepresented to [facility] ED 10/15 with significant dyspnea to the point that he could not speak in full sentences. He was placed on CPAP but did not respond; therefore, he was intubated.  After intubation, COVID returned positive.  He has been evaluated by pulmonary on prior admissions given recurrent PNA. He has bronchoscopy in Aug 2020 which showed severe bronchiectasis bilaterally, neutrophil predominance and transbronchial biopsies were benign.   Hospital Course   Otis Portal [name] a 59 y.o.malewho has a PMH of chronic respiratory failure, COPD, diabetes, hypertension, presented to [facility] ED 10/15 with significant dyspnea to the point that he could not speak in full sentences. He was placed on CPAP but did not respond; therefore, he was intubated.After intubation, COVID returned positive. He has been evaluated by pulmonary on prior admissions given recurrent PNA. He has bronchoscopy in Aug 2020 which showed severe bronchiectasis bilaterally, neutrophil predominance and transbronchial biopsies were benign. Patient was successfully extubated 10/17, and he was transferred [facility]/19.   Acute on chronic hypoxic Resp. Failure due to Acute Covid 19 Viral Pneumonitis during the ongoing 2020 Covid 19 Pandemic   -Unvaccinated, he is a critically ill on presentation required intubation.  He was successfully extubated after 48 hours, with increased oxygen requirement initially, he is currently back at his baseline oxygen requirement, report requiring 2 L at rest, 4 L with activity. -He is extubated 10/17. -Treated initially with IV steroids, he will be discharged on home dose prednisone 20 mg oral daily (this was confirmed with with the patient that he is at current dose, he was given a refill for next 30 days till he is seen by PCP) -He was treated with remdesivir during hospital stay, as well he was treated with baricitinib as well -Was encouraged to take his incentive spirometry and flutter valve at home and continue using them .   COPD exacerbation -Treated with antibiotics for 5 days, continue with bronchodilators, continue with steroid  .  Hypertension -Continue with home dose metoprolol, and Cozaar  Diabetes mellitus -We will hold home dose Metformin, will keep on sliding scale during hospital stay, A1c is 6.8  History of alcohol abuse -No evidence of withdrawal during hospital stay, continue with thiamine and folic acid on discharge  Discharge Condition:  stable   Patient states his breathing is back at baseline he states he is no longer drinking alcohol he states his blood sugars have been in the 100 range he complains of his left hand feeling numb and is not able to sleep at night due to agitation   11/13/19 This patient was seen in return follow-up after had been seen by way of a telephone visit a week ago.  He was just hospitalized note discharge summary is as above.  He had a Covid pneumonia and respiratory failure was on mechanical ventilator.  Today he comes in on 4 L oxygen nighttime only been on room air today is 95%.  He has advanced COPD.  He had significant alcohol abuse  he states he is no longer drinking alcohol.  He still complains of bilateral rashes on the arms and  the umbilical area.  See shortness of breath assessment below  Shortness of Breath This is a chronic problem. The current episode started more than 1 year ago. The problem occurs daily (qhs shortness of breath). The problem has been rapidly improving. Associated symptoms include a rash. Pertinent negatives include no chest pain, claudication, fever, headaches, hemoptysis, leg pain, leg swelling, orthopnea, PND, sore throat, sputum production, syncope, vomiting or wheezing. The symptoms are aggravated by any activity, lying flat and occupational exposure. Associated symptoms comments: No dysphagia No gerd . Risk factors include smoking. He has tried beta agonist inhalers, oral steroids and steroid inhalers for the symptoms. The treatment provided moderate relief. His past medical history is significant for COPD and pneumonia. There is no history of asthma, CAD, DVT, a heart failure or PE.    Past Medical History:  Diagnosis Date  . Acute on chronic respiratory failure with hypoxia ([place]) 06/08/2013  . CAP (community acquired pneumonia) 05/17/2018   See admit 05/17/18 ? rml  with   covid pcr neg - rx augmentin > f/u cxr in 4-6 weeks is fine unless condition declines   . Community acquired pneumonia of right lower lobe of lung 05/17/2018   See admit 05/17/18 ? rml  with   covid pcr neg - rx augmentin > f/u cxr in 4-6 weeks is fine unless condition declines   . COPD (chronic obstructive pulmonary disease) ([place])    not on home  . Diabetes ([place])   . Dyspnea   . Hypertension   . Pneumonia 04/06/2016  . Pneumonia due to COVID-19 virus 10/19/2019  . Pneumothorax    2016, fell from ladder  . 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 Tick bite 06/03/2018     Family History  Problem Relation Age of Onset  . Heart disease Father   . COPD Sister      Social History   Socioeconomic History  . Marital status: Divorced    Spouse name: Not on file  . Number of children: Not on file  . Years of  education: Not on file  . Highest education level: Not on file  Occupational History  . Occupation: Dealer  . Occupation: Painter  Tobacco Use  . Smoking status: Former Smoker    Packs/day: 1.50    Years: 40.00    Pack years: 60.00    Types: Cigarettes    Quit date: 2014    Years since quitting: 7.8  . Smokeless tobacco: Current User    Types: Chew  Vaping Use  . Vaping Use: Never used  Substance and Sexual Activity  . Alcohol use: Yes    Alcohol/week: 28.0 standard drinks    Types: 28 Cans of beer per week    Comment: 4 beers a night; + jitters with not drinking, denies DTs or seizures  . Drug use: Yes    Types: Marijuana    Comment: daily; quit using crack and methamphetamine about 5 months ago   . Sexual activity: Not Currently    Partners: Female  Other Topics Concern  . Not on file  Social History Narrative   Lives alone near [place] Determinants of Health   Financial Resource Strain:   . Difficulty of Paying Living Expenses: Not on file  Food Insecurity:   . Worried About Charity fundraiser in the Last Year: Not on file  .  Ran Out of Food in the Last Year: Not on file  Transportation Needs:   . Lack of Transportation (Medical): Not on file  . Lack of Transportation (Non-Medical): Not on file  Physical Activity:   . Days of Exercise per Week: Not on file  . Minutes of Exercise per Session: Not on file  Stress:   . Feeling of Stress : Not on file  Social Connections:   . Frequency of Communication with Friends and Family: Not on file  . Frequency of Social Gatherings with Friends and Family: Not on file  . Attends Religious Services: Not on file  . Active Member of Clubs or Organizations: Not on file  . Attends Archivist Meetings: Not on file  . Marital Status: Not on file  Intimate Partner Violence:   . Fear of Current or Ex-Partner: Not on file  . Emotionally Abused: Not on file  . Physically Abused: Not on file  . Sexually  Abused: Not on file     Allergies  Allergen Reactions  . Gabapentin Other (See Comments)    hallucinations     Outpatient Medications Prior to Visit  Medication Sig Dispense Refill  . Accu-Chek Softclix Lancets lancets Use as instructed to check blood sugar once daily. (Patient taking differently: 1 each by Other route See admin instructions. Use as instructed to check blood sugar once daily.) 100 each 2  . albuterol (PROAIR HFA) 108 (90 Base) MCG/ACT inhaler Inhale 2 puffs into the lungs every 6 (six) hours as needed for wheezing or shortness of breath. 8.5 g 2  . albuterol (PROVENTIL) (2.5 MG/3ML) 0.083% nebulizer solution Take 3 mLs (2.5 mg total) by nebulization every 6 (six) hours as needed for wheezing or shortness of breath. 150 mL 0  . Blood Glucose Monitoring Suppl (ACCU-CHEK GUIDE ME) w/Device KIT 1 each by Other route in the morning and at bedtime.     . Blood Glucose Monitoring Suppl (TRUE METRIX METER) w/Device KIT Use to measure blood sugar twice a day (Patient taking differently: 1 each by Other route See admin instructions. Use to measure blood sugar twice a day) 1 kit 0  . folic acid (FOLVITE) 1 MG tablet Take 1 tablet (1 mg total) by mouth daily. 90 tablet 0  . furosemide (LASIX) 20 MG tablet Take 1 tablet (20 mg total) by mouth daily as needed for edema (lower extremity). (Patient taking differently: Take 20 mg by mouth daily as needed for fluid or edema. ) 30 tablet 3  . glucose blood (TRUE METRIX BLOOD GLUCOSE TEST) test strip Use as instructed (Patient taking differently: 1 each by Other route as directed. Use as instructed) 100 each 12  . hydrOXYzine (ATARAX/VISTARIL) 25 MG tablet Take 1 tablet (25 mg total) by mouth every 8 (eight) hours as needed for anxiety or itching. 30 tablet 0  . multivitamin-iron-minerals-folic acid (CENTRUM) chewable tablet Chew 1 tablet by mouth daily. 90 tablet 2  . pantoprazole (PROTONIX) 40 MG tablet Take 1 tablet (40 mg total) by mouth daily.  30 tablet 6  . predniSONE (DELTASONE) 20 MG tablet Take 1 tablet (20 mg total) by mouth daily with breakfast. (Patient taking differently: Take by mouth daily with breakfast. ) 60 tablet 1  . Respiratory Therapy Supplies (FLUTTER) DEVI Use 4 times daily (Patient taking differently: 1 each by Other route See admin instructions. Use 4 times daily) 1 each 0  . Tiotropium Bromide Monohydrate (SPIRIVA RESPIMAT) 2.5 MCG/ACT AERS Two puff daily (Patient taking  differently: Inhale 2 puffs into the lungs daily. ) 4 g 11  . losartan (COZAAR) 100 MG tablet Take 1 tablet (100 mg total) by mouth daily. 30 tablet 2  . metFORMIN (GLUCOPHAGE) 500 MG tablet Take 1 tablet (500 mg total) by mouth 2 (two) times daily with a meal. 120 tablet 01  . metoprolol succinate (TOPROL-XL) 25 MG 24 hr tablet Take 1 tablet (25 mg total) by mouth daily. 90 tablet 3  . mometasone-formoterol (DULERA) 200-5 MCG/ACT AERO Inhale 2 puffs into the lungs 2 (two) times daily. 13 g 0  . thiamine (VITAMIN B-1) 100 MG tablet Take 1 tablet (100 mg total) by mouth daily. 30 tablet 3   No facility-administered medications prior to visit.     Review of Systems  Constitutional: Negative for fever.  HENT: Negative for sinus pressure, sinus pain, sore throat and trouble swallowing.   Eyes: Negative.   Respiratory: Positive for cough and shortness of breath. Negative for hemoptysis, sputum production, chest tightness and wheezing.   Cardiovascular: Negative for chest pain, palpitations, orthopnea, claudication, leg swelling, syncope and PND.  Gastrointestinal: Negative for nausea and vomiting.  Genitourinary: Negative.   Musculoskeletal: Positive for back pain.  Skin: Positive for rash.  Neurological: Negative for headaches.  Psychiatric/Behavioral: Negative for hallucinations. The patient is not nervous/anxious and is not hyperactive.        Objective:   Physical Exam Vitals:   11/13/19 1529  BP: 118/72  Pulse: 94  SpO2: 96%   Weight: 191 lb (86.6 kg)   Gen: Pleasant, well-nourished, in no distress,  normal affect  ENT: No lesions,  mouth clear,  oropharynx clear, no postnasal drip  Neck: No JVD, no TMG, no carotid bruits  Lungs: No use of accessory muscles, no dullness to percussion, distant breath sounds  Cardiovascular: RRR, heart sounds normal, no murmur or gallops, no peripheral edema  Abdomen: soft and NT, no HSM,  BS normal  Musculoskeletal: No deformities, no cyanosis or clubbing  Neuro: alert, non focal  Skin: Warm, no lesions , erythematous rash both forearms and umbilical area looks fungal in nature    Disability eval at [facility]:   Pulmonary Function Test (04/19/2019 1:42 PM EDT) Pulmonary Function Test (04/19/2019 1:42 PM EDT)  Component Value Ref Range Performed At Pathologist Signature  FVC Post 2.51 L [facility]   FEV1 Post 1.55 L [facility]   FEV1/FVC Post 61.87 % [facility]   FEF25-75% Post 0.90 L/s [facility]   PEF Post 3.42 L/s [facility]   MVV Post 42.00 L/min [facility]   FVC Pre 2.92 L [facility]   FEV1 Pre 1.57 L [facility]   FEV1/FVC Pre 54.01 % [facility]   FEF25-75% Pre 0.68 L/s [facility]   PEF Pre 4.14 L/s [facility]   MVV Pre 45.00 L/min [facility]   FVC_LLN 3.73  [facility]   FVC_Z-SCORE -2.84  [facility]   FVC_%PRED 60 % % [facility]   FVC_Z-SCORE -2.84  [facility]   FVC_%PRED 52 % % [facility]   FVC %Chng -14 % % [facility]   FEV1_LLN 2.86  [facility]   FEV1_Z-SCORE -3.78  [facility]   FEV1_%PRED 42 % % [facility]   FEV1_Z-SCORE -3.78  [facility]   FEV1_%PRED 41 % % [facility]   FEV1 %Chng -2 % % [facility]   FEV1/FVC_LLN 66  [facility]   FEV1/FVC %Chng 15 % % [facility]   FEF25-75%_LLN 1.60  [facility]   FEF25-75%_Z-SCORE -3.05  [facility]   FEF25-75%_%PRED 21 % % [facility]   FEF25-75%_Z-SCORE -3.05  [facility]   FEF25-75%_%PRED 28 % % [facility]   FEF25-75% %Chng 33 % % [facility]   PEF_LLN 7.25  [facility]   PEF_%PRED 43 % 36 % % [facility]   PEF %Chng -17 % % [facility]   MVV_LLN 130  [facility]   MVV_%PRED 35 % 32 % % [facility]   MVV %Chng -7 % % [facility]     Media Information    Document Information  Photos    11/02/2018 14:43  Attached To:  [name]  Source Information  [name], MD  [facility]   Pertinent Labs, Studies, and Procedures:  DG Chest Portable 1 View  Result Date: 09/22/2019 CLINICAL DATA:  Dyspnea. COPD exacerbation. Chest pain and shortness of breath. EXAM: PORTABLE CHEST 1 VIEW COMPARISON:  Cyst 07/05/2019 FINDINGS: Stable cardiomediastinal contours. Chronic blunting of the left costophrenic angle due to pleuroparenchymal scarring. Changes of emphysema noted with asymmetric increase hazy opacity within the right lung base concerning for pneumonia. Numerous remote bilateral rib fracture deformities. IMPRESSION: 1. Increased hazy opacity within the right lung base concerning for pneumonia. 2. Emphysema. Electronically Signed   By: Kerby Moors M.D.   On: 09/22/2019 04:20     BMP Latest Ref Rng & Units 10/24/2019 10/23/2019 10/22/2019  Glucose 70 - 99 mg/dL 130(H) 139(H) 145(H)  BUN 6 - 20 mg/dL 31(H) 34(H) 32(H)  Creatinine 0.61 - 1.24 mg/dL 0.73  0.66 0.70  BUN/Creat Ratio 9 - 20 - - -  Sodium 135 - 145 mmol/L 139 138 142  Potassium 3.5 - 5.1 mmol/L 4.6 4.6 4.9  Chloride 98 - 111 mmol/L 99 99 101  CO2 22 - 32 mmol/L _0 Calcium 8.9 - 10.3 mg/dL 9.4 9.2 9.4   Hepatic Function Panel     Component Value Date/Time   PROT 5.0 (L) 10/20/2019 0050   PROT 5.9 (L) 07/23/2019 0900   ALBUMIN 2.8 (L) 10/20/2019 0050   ALBUMIN 4.0 07/23/2019 0900   AST 18 10/20/2019 0050   ALT 22 10/20/2019 0050   ALT 62 (H) 06/23/2016 1226   ALKPHOS 36 (L) 10/20/2019 0050   BILITOT 0.7 10/20/2019 0050   BILITOT <0.2 07/23/2019 0900   BILIDIR <0.1 10/20/2019 0050   BILIDIR 0.12 04/16/2016 1601   IBILI NOT CALCULATED 10/20/2019 0050   CBC Latest Ref Rng & Units 10/24/2019 10/21/2019 10/21/2019  WBC 4.0 - 10.5 K/uL 17.3(H) 23.8(H) 20.5(H)  Hemoglobin 13.0 - 17.0 g/dL 13.9 13.0 11.0(L)  Hematocrit 39 - 52 % 43.0 40.8 35.8(L)  Platelets 150 - 400 K/uL 300 286 272       Assessment & Plan:  I personally reviewed all images and lab data in the [facility] system as well as any outside material available during this office visit and agree with the  radiology impressions.   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  Diagnoses and all orders for this  visit:  Eczema, unspecified type -     Ambulatory referral to Dermatology  Essential hypertension -     losartan (COZAAR) 100 MG tablet; Take 1 tablet (100 mg total) by mouth daily.  Chronic respiratory failure with hypoxia ([facility])  Bronchiectasis without complication ([facility])  COPD mixed type ([place])  Pneumonia due to COVID-19 virus  Type 2 diabetes mellitus without complication, without long-term current use of insulin ([place])  Other atopic dermatitis  Alcohol use disorder, moderate, dependence ([place])  Other orders -     thiamine (VITAMIN B-1) 100 MG tablet; Take 1 tablet (100 mg total) by mouth daily. -     mometasone-formoterol (DULERA) 200-5 MCG/ACT AERO; Inhale 2 puffs into the lungs 2 (two) times daily. -     metoprolol succinate (TOPROL-XL) 25 MG 24 hr tablet; Take 1 tablet (25 mg total) by mouth daily. -     metFORMIN (GLUCOPHAGE) 500 MG tablet; Take 1 tablet (500 mg total) by mouth 2 (two) times daily with a meal. -     fluconazole (DIFLUCAN) 100 MG tablet; Take 1 tablet (100 mg total) by mouth daily for 7 days. -     triamcinolone cream (KENALOG) 0.1 %; Apply 1 application topically 2 (two) times daily.

## 2019-11-13 ENCOUNTER — Ambulatory Visit: Payer: MEDICAID | Attending: Critical Care Medicine | Admitting: Critical Care Medicine

## 2019-11-13 ENCOUNTER — Other Ambulatory Visit: Payer: Self-pay

## 2019-11-13 ENCOUNTER — Other Ambulatory Visit: Payer: Self-pay | Admitting: Critical Care Medicine

## 2019-11-13 ENCOUNTER — Encounter: Payer: Self-pay | Admitting: Critical Care Medicine

## 2019-11-13 VITALS — BP 118/72 | HR 94 | Wt 191.0 lb

## 2019-11-13 DIAGNOSIS — I10 Essential (primary) hypertension: Secondary | ICD-10-CM | POA: Diagnosis not present

## 2019-11-13 DIAGNOSIS — J479 Bronchiectasis, uncomplicated: Secondary | ICD-10-CM

## 2019-11-13 DIAGNOSIS — U071 COVID-19: Secondary | ICD-10-CM

## 2019-11-13 DIAGNOSIS — J1282 Pneumonia due to coronavirus disease 2019: Secondary | ICD-10-CM

## 2019-11-13 DIAGNOSIS — E119 Type 2 diabetes mellitus without complications: Secondary | ICD-10-CM

## 2019-11-13 DIAGNOSIS — J9611 Chronic respiratory failure with hypoxia: Secondary | ICD-10-CM

## 2019-11-13 DIAGNOSIS — L2089 Other atopic dermatitis: Secondary | ICD-10-CM

## 2019-11-13 DIAGNOSIS — L309 Dermatitis, unspecified: Secondary | ICD-10-CM | POA: Diagnosis not present

## 2019-11-13 DIAGNOSIS — F1020 Alcohol dependence, uncomplicated: Secondary | ICD-10-CM

## 2019-11-13 DIAGNOSIS — J449 Chronic obstructive pulmonary disease, unspecified: Secondary | ICD-10-CM

## 2019-11-13 MED ORDER — LOSARTAN POTASSIUM 100 MG PO TABS: 100.0000 mg | ORAL_TABLET | Freq: Every day | ORAL | 2 refills | Status: DC

## 2019-11-13 MED ORDER — FLUCONAZOLE 100 MG PO TABS: 100.0000 mg | ORAL_TABLET | Freq: Every day | ORAL | 0 refills | Status: DC

## 2019-11-13 MED ORDER — METOPROLOL SUCCINATE ER 25 MG PO TB24
25.0000 mg | ORAL_TABLET | Freq: Every day | ORAL | 3 refills | Status: DC
Start: 2019-11-13 — End: 2020-01-16

## 2019-11-13 MED ORDER — MOMETASONE FURO-FORMOTEROL FUM 200-5 MCG/ACT IN AERO
2.0000 | INHALATION_SPRAY | Freq: Two times a day (BID) | RESPIRATORY_TRACT | 3 refills | Status: DC
Start: 2019-11-13 — End: 2019-11-14

## 2019-11-13 MED ORDER — TRIAMCINOLONE ACETONIDE 0.1 % EX CREA: 1.0000 "application " | TOPICAL_CREAM | Freq: Two times a day (BID) | CUTANEOUS | 0 refills | Status: DC

## 2019-11-13 MED ORDER — METFORMIN HCL 500 MG PO TABS: 500.0000 mg | ORAL_TABLET | Freq: Two times a day (BID) | ORAL | 3 refills | Status: DC

## 2019-11-13 MED ORDER — VITAMIN B-1 100 MG PO TABS
100.0000 mg | ORAL_TABLET | Freq: Every day | ORAL | 3 refills | Status: DC
Start: 2019-11-13 — End: 2020-02-13

## 2019-11-13 MED FILL — TRIAMCINOLONE 0.1% CREAM: 0.1 | 7 days supply | Qty: 30 | Fill #0

## 2019-11-13 MED FILL — FLUCONAZOLE 100 MG TABLET: 100 | 7 days supply | Qty: 7 | Fill #0

## 2019-11-13 MED FILL — LOSARTAN POTASSIUM 100 MG T: 100 | 90 days supply | Qty: 90 | Fill #0

## 2019-11-13 MED FILL — METOPROLOL SUCCINATE ER 25: 25 | 90 days supply | Qty: 90 | Fill #0

## 2019-11-13 MED FILL — METFORMIN HCL 500 MG TABS: 500 | 90 days supply | Qty: 120 | Fill #0

## 2019-11-13 NOTE — Patient Instructions (Addendum)
Start Diflucan fluconazole 1 pill daily for 7 days for the yeast infection in your skin  Continue triamcinolone cream to the arms and the abdomen  Dermatology referral be made  No change in your other medications  Return [name] 1 month  Stay on oxygen 4 L at bedtime you may be on room air during the day

## 2019-11-13 NOTE — Assessment & Plan Note (Signed)
Bronchiectasis continue inhaled medications as prescribed

## 2019-11-13 NOTE — Assessment & Plan Note (Signed)
Chronic respiratory failure with hypoxemia the acute respiratory failure component has resolved continue oxygen 4 L at bedtime

## 2019-11-13 NOTE — Assessment & Plan Note (Signed)
Covid pneumonia has resolved

## 2019-11-13 NOTE — Assessment & Plan Note (Signed)
No longer drinking alcohol

## 2019-11-13 NOTE — Assessment & Plan Note (Addendum)
Mixed COPD  Continue current medications no change  Continue chronic prednisone

## 2019-11-13 NOTE — Assessment & Plan Note (Signed)
Continue Metformin

## 2019-11-13 NOTE — Assessment & Plan Note (Signed)
Refer to dermatology begin Diflucan and continue triamcinolone cream

## 2019-11-14 ENCOUNTER — Other Ambulatory Visit: Payer: Self-pay | Admitting: Critical Care Medicine

## 2019-11-14 ENCOUNTER — Telehealth: Payer: Self-pay

## 2019-11-14 MED ORDER — BUDESONIDE-FORMOTEROL FUMARATE 160-4.5 MCG/ACT IN AERO: 2.0000 | INHALATION_SPRAY | Freq: Two times a day (BID) | RESPIRATORY_TRACT | 12 refills | Status: DC

## 2019-11-14 MED FILL — SYMBICORT 160-4.5 MCG INH: 160-4.5 | 30 days supply | Qty: 10 | Fill #0

## 2019-11-14 NOTE — Telephone Encounter (Signed)
Thank you :)

## 2019-11-14 NOTE — Telephone Encounter (Signed)
Ok let us go with symbicort , I sent order

## 2019-11-14 NOTE — Telephone Encounter (Signed)
James [name] has changed manufacturers ([employer] to [employer] change) and the new James [name] is not recognized by Medicaid yet, could patient be changed to Symbicort at this time for insurance coverage?  Or, another option may be to locate a [employer] manufactured inhaler at another pharmacy-pt prefers the therapy change option and filling it here at [facility].  Medicaid does not have an estimated time for this to be resolved in their system.

## 2019-11-16 ENCOUNTER — Emergency Department (HOSPITAL_COMMUNITY)
Admission: EM | Admit: 2019-11-16 | Discharge: 2019-11-16 | Disposition: A | Discharge status: Home / Self Care | Payer: Medicaid Other | Attending: Emergency Medicine | Admitting: Emergency Medicine

## 2019-11-16 ENCOUNTER — Other Ambulatory Visit: Payer: Self-pay

## 2019-11-16 ENCOUNTER — Emergency Department (HOSPITAL_COMMUNITY): Payer: Medicaid Other

## 2019-11-16 DIAGNOSIS — Z87891 Personal history of nicotine dependence: Secondary | ICD-10-CM | POA: Diagnosis not present

## 2019-11-16 DIAGNOSIS — R079 Chest pain, unspecified: Secondary | ICD-10-CM | POA: Diagnosis not present

## 2019-11-16 DIAGNOSIS — R0602 Shortness of breath: Secondary | ICD-10-CM | POA: Diagnosis not present

## 2019-11-16 DIAGNOSIS — E119 Type 2 diabetes mellitus without complications: Secondary | ICD-10-CM | POA: Diagnosis not present

## 2019-11-16 DIAGNOSIS — I10 Essential (primary) hypertension: Secondary | ICD-10-CM | POA: Diagnosis not present

## 2019-11-16 DIAGNOSIS — R0789 Other chest pain: Secondary | ICD-10-CM | POA: Insufficient documentation

## 2019-11-16 DIAGNOSIS — J441 Chronic obstructive pulmonary disease with (acute) exacerbation: Secondary | ICD-10-CM | POA: Insufficient documentation

## 2019-11-16 DIAGNOSIS — Z7951 Long term (current) use of inhaled steroids: Secondary | ICD-10-CM | POA: Insufficient documentation

## 2019-11-16 DIAGNOSIS — U071 COVID-19: Secondary | ICD-10-CM | POA: Diagnosis not present

## 2019-11-16 DIAGNOSIS — Z79899 Other long term (current) drug therapy: Secondary | ICD-10-CM | POA: Diagnosis not present

## 2019-11-16 DIAGNOSIS — J80 Acute respiratory distress syndrome: Secondary | ICD-10-CM | POA: Diagnosis not present

## 2019-11-16 DIAGNOSIS — R0902 Hypoxemia: Secondary | ICD-10-CM | POA: Diagnosis not present

## 2019-11-16 DIAGNOSIS — I4891 Unspecified atrial fibrillation: Secondary | ICD-10-CM | POA: Diagnosis not present

## 2019-11-16 LAB — CBC WITH DIFFERENTIAL/PLATELET
Abs Immature Granulocytes: 0.04 10*3/uL (ref 0.00–0.07)
Basophils Absolute: 0 10*3/uL (ref 0.0–0.1)
Basophils Relative: 0 %
Eosinophils Absolute: 0.1 10*3/uL (ref 0.0–0.5)
Eosinophils Relative: 1 %
HCT: 42.1 % (ref 39.0–52.0)
Hemoglobin: 13.4 g/dL (ref 13.0–17.0)
Immature Granulocytes: 0 %
Lymphocytes Relative: 19 %
Lymphs Abs: 2 10*3/uL (ref 0.7–4.0)
MCH: 31.2 pg (ref 26.0–34.0)
MCHC: 31.8 g/dL (ref 30.0–36.0)
MCV: 98.1 fL (ref 80.0–100.0)
Monocytes Absolute: 0.9 10*3/uL (ref 0.1–1.0)
Monocytes Relative: 9 %
Neutro Abs: 7.4 10*3/uL (ref 1.7–7.7)
Neutrophils Relative %: 71 %
Platelets: 301 10*3/uL (ref 150–400)
RBC: 4.29 MIL/uL (ref 4.22–5.81)
RDW: 12.7 % (ref 11.5–15.5)
WBC: 10.4 10*3/uL (ref 4.0–10.5)
nRBC: 0 % (ref 0.0–0.2)

## 2019-11-16 LAB — BASIC METABOLIC PANEL
Anion gap: 14 (ref 5–15)
BUN: 15 mg/dL (ref 6–20)
CO2: 27 mmol/L (ref 22–32)
Calcium: 9.4 mg/dL (ref 8.9–10.3)
Chloride: 103 mmol/L (ref 98–111)
Creatinine, Ser: 0.98 mg/dL (ref 0.61–1.24)
GFR, Estimated: >60 mL/min (ref >60)
Glucose, Bld: 113 mg/dL — ABNORMAL HIGH (ref 70–99)
Potassium: 4 mmol/L (ref 3.5–5.1)
Sodium: 144 mmol/L (ref 135–145)

## 2019-11-16 LAB — D-DIMER, QUANTITATIVE: D-Dimer, Quant: 0.27 ug/mL-FEU (ref 0.00–0.50)

## 2019-11-16 LAB — TROPONIN I (HIGH SENSITIVITY): Troponin I (High Sensitivity): 25 ng/L — ABNORMAL HIGH (ref <18)

## 2019-11-16 MED ORDER — ALBUTEROL SULFATE HFA 108 (90 BASE) MCG/ACT IN AERS: 2.0000 | INHALATION_SPRAY | RESPIRATORY_TRACT | 0 refills | Status: DC | PRN

## 2019-11-16 MED ORDER — PREDNISONE 20 MG PO TABS: 40.0000 mg | ORAL_TABLET | Freq: Every day | ORAL | 0 refills | Status: DC

## 2019-11-16 MED ORDER — IPRATROPIUM-ALBUTEROL 0.5-2.5 (3) MG/3ML IN SOLN
3.0000 mL | Freq: Once | RESPIRATORY_TRACT | Status: AC
  Administered 2019-11-16: 3 mL via RESPIRATORY_TRACT
  Filled 2019-11-16: qty 3

## 2019-11-16 NOTE — ED Notes (Signed)
Discharge paper work completed with pt, he left with out letting anyone know.

## 2019-11-16 NOTE — ED Triage Notes (Signed)
Pt bib gems c/o of sob that woke him up during the night. Pt has hx of COPD and a-fib. Pt currently on 4L of O2 at home. [facility] reports o2 sat of 80% on arrival on 4L O2. Pt tested positive for COVID approx 3 weeks ago. 125 mg solu-medrol, 1 mg atrovent, 10 mg albuterol, 2g mag given PTA.

## 2019-11-16 NOTE — ED Notes (Signed)
Pt ambulated on 4L O2. O2 sats remained 94%-98%. HR remained 130-140's. Pt tolerated well.

## 2019-11-16 NOTE — ED Notes (Signed)
Called [facility] for transport.  

## 2019-11-16 NOTE — ED Provider Notes (Signed)
TIME SEEN: 4:41 AM  CHIEF COMPLAINT: Shortness of breath, chest pain  HPI: Patient is a 59 year old male with history of chronic respiratory failure on home oxygen, COPD, hypertension, diabetes, recent pneumonia due to COVID-19 who presents to the emergency department shortness of breath that started yesterday.  Also complains of left-sided chest soreness.  Reports nonproductive cough.  No fever.  Does report that he recovered and felt back to baseline after Covid pneumonia in October 2021.  He reports he wears 2 L of oxygen in the daytime and 4 at night.  Was given 10 of albuterol, 1 of Atrovent, 125 mg of IV Solu-Medrol and 2 g of IV magnesium by EMS prior to arrival.  Reports he is feeling better.  Currently on nonrebreather.  ROS: See HPI Constitutional: no fever  Eyes: no drainage  ENT: no runny nose   Cardiovascular:   chest pain  Resp: SOB  GI: no vomiting GU: no dysuria Integumentary: no rash  Allergy: no hives  Musculoskeletal: no leg swelling  Neurological: no slurred speech ROS otherwise negative  PAST MEDICAL HISTORY/PAST SURGICAL HISTORY:  Past Medical History:  Diagnosis Date  . Acute on chronic respiratory failure with hypoxia ([place]) 06/08/2013  . CAP (community acquired pneumonia) 05/17/2018   See admit 05/17/18 ? rml  with   covid pcr neg - rx augmentin > f/u cxr in 4-6 weeks is fine unless condition declines   . Community acquired pneumonia of right lower lobe of lung 05/17/2018   See admit 05/17/18 ? rml  with   covid pcr neg - rx augmentin > f/u cxr in 4-6 weeks is fine unless condition declines   . COPD (chronic obstructive pulmonary disease) ([place])    not on home  . Diabetes ([place])   . Dyspnea   . Hypertension   . Pneumonia 04/06/2016  . Pneumonia due to COVID-19 virus 10/19/2019  . Pneumothorax    2016, fell from ladder  . 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 Tick bite 06/03/2018    MEDICATIONS:  Prior to Admission medications   Medication Sig Start Date End  Date Taking? Authorizing Provider  Accu-Chek Softclix Lancets lancets Use as instructed to check blood sugar once daily. Patient taking differently: 1 each by Other route See admin instructions. Use as instructed to check blood sugar once daily. 08/15/19   [name], MD  albuterol (PROAIR HFA) 108 (90 Base) MCG/ACT inhaler Inhale 2 puffs into the lungs every 6 (six) hours as needed for wheezing or shortness of breath. 08/23/19   [name], MD  albuterol (PROVENTIL) (2.5 MG/3ML) 0.083% nebulizer solution Take 3 mLs (2.5 mg total) by nebulization every 6 (six) hours as needed for wheezing or shortness of breath. 10/02/19   [name], MD  Blood Glucose Monitoring Suppl (ACCU-CHEK GUIDE ME) w/Device KIT 1 each by Other route in the morning and at bedtime.  08/15/19   [provider]  Blood Glucose Monitoring Suppl (TRUE METRIX METER) w/Device KIT Use to measure blood sugar twice a day Patient taking differently: 1 each by Other route See admin instructions. Use to measure blood sugar twice a day 08/14/19   [name], MD  budesonide-formoterol [facility]) 160-4.5 MCG/ACT inhaler Inhale 2 puffs into the lungs 2 (two) times daily. 11/14/19 11/13/20  [name], MD  fluconazole (DIFLUCAN) 100 MG tablet Take 1 tablet (100 mg total) by mouth daily for 7 days. 11/13/19 11/20/19  [name], MD  folic acid (FOLVITE) 1 MG  tablet Take 1 tablet (1 mg total) by mouth daily. 10/02/19   [name], MD  furosemide (LASIX) 20 MG tablet Take 1 tablet (20 mg total) by mouth daily as needed for edema (lower extremity). Patient taking differently: Take 20 mg by mouth daily as needed for fluid or edema.  10/05/19   [name], MD  glucose blood (TRUE METRIX BLOOD GLUCOSE TEST) test strip Use as instructed Patient taking differently: 1 each by Other route as directed. Use as instructed 08/14/19   [name], MD  hydrOXYzine (ATARAX/VISTARIL) 25 MG tablet Take 1  tablet (25 mg total) by mouth every 8 (eight) hours as needed for anxiety or itching. 10/29/19   Chase Picket, MD  losartan (COZAAR) 100 MG tablet Take 1 tablet (100 mg total) by mouth daily. 11/13/19   [name], MD  metFORMIN (GLUCOPHAGE) 500 MG tablet Take 1 tablet (500 mg total) by mouth 2 (two) times daily with a meal. 11/13/19 01/12/20  [name], MD  metoprolol succinate (TOPROL-XL) 25 MG 24 hr tablet Take 1 tablet (25 mg total) by mouth daily. 11/13/19   [name], MD  multivitamin-iron-minerals-folic acid (CENTRUM) chewable tablet Chew 1 tablet by mouth daily. 10/29/19   Chase Picket, MD  pantoprazole (PROTONIX) 40 MG tablet Take 1 tablet (40 mg total) by mouth daily. 10/02/19   [name], MD  predniSONE (DELTASONE) 20 MG tablet Take 1 tablet (20 mg total) by mouth daily with breakfast. Patient taking differently: Take by mouth daily with breakfast.  10/02/19   [name], MD  Respiratory Therapy Supplies (FLUTTER) DEVI Use 4 times daily Patient taking differently: 1 each by Other route See admin instructions. Use 4 times daily 04/27/18   [name], MD  thiamine (VITAMIN B-1) 100 MG tablet Take 1 tablet (100 mg total) by mouth daily. 11/13/19   [name], MD  Tiotropium Bromide Monohydrate (SPIRIVA RESPIMAT) 2.5 MCG/ACT AERS Two puff daily Patient taking differently: Inhale 2 puffs into the lungs daily.  08/23/19   [name], MD  triamcinolone cream (KENALOG) 0.1 % Apply 1 application topically 2 (two) times daily. 11/13/19   [name], MD    ALLERGIES:  Allergies  Allergen Reactions  . Gabapentin Other (See Comments)    hallucinations    SOCIAL HISTORY:  Social History   Tobacco Use  . Smoking status: Former Smoker    Packs/day: 1.50    Years: 40.00    Pack years: 60.00    Types: Cigarettes    Quit date: 2014    Years since quitting: 7.8  . Smokeless tobacco: Current User    Types: Chew  Substance Use  Topics  . Alcohol use: Yes    Alcohol/week: 28.0 standard drinks    Types: 28 Cans of beer per week    Comment: 4 beers a night; + jitters with not drinking, denies DTs or seizures    FAMILY HISTORY: Family History  Problem Relation Age of Onset  . Heart disease Father   . COPD Sister     EXAM: BP (!) 131/93   Pulse (!) 118   Temp (!) 97.4 F (36.3 C) (Oral)   Resp (!) 30   SpO2 97%  CONSTITUTIONAL: Alert and oriented and responds appropriately to questions.  Chronically ill-appearing HEAD: Normocephalic EYES: Conjunctivae clear, pupils appear equal, EOM appear intact ENT: normal nose; moist mucous membranes NECK: Supple, normal ROM CARD: Regular and tachycardic;  S1 and S2 appreciated; no murmurs, no clicks, no rubs, no gallops RESP: Satting 100% on nonrebreather.  Breath sounds diminished at bases bilaterally but there are no wheezes, rhonchi or rales appreciated.  He is speaking full sentences.  Not currently in respiratory distress.  Mildly tachypneic. ABD/GI: Normal bowel sounds; non-distended; soft, non-tender, no rebound, no guarding, no peritoneal signs, no hepatosplenomegaly BACK:  The back appears normal EXT: Normal ROM in all joints; no deformity noted, no edema; no cyanosis, no calf tenderness or calf swelling SKIN: Normal color for age and race; warm; no rash on exposed skin NEURO: Moves all extremities equally PSYCH: The patient's mood and manner are appropriate.   MEDICAL DECISION MAKING: Patient here with shortness of breath, atypical chest pain.  Differential includes COPD exacerbation, residual pneumonia, PE given recent Covid illness and hospitalization, pneumothorax, less likely ACS or CHF.  Will obtain labs, chest x-ray.  Will give DuoNeb and reassess.  EKG shows no new ischemic change.  ED PROGRESS: Patient reports feeling much better.  Labs reassuring.  Minimally elevated high-sensitivity troponin but this is chronic for him.  He has had chest pain for over  24 hours that he describes as a soreness on likely chest wall pain due to COPD exacerbation.  I do not feel he needs a second troponin at this time.  D-dimer negative.  Chest x-ray clear.  Lungs now clear to auscultation with good aeration and he states he is ready for discharge home.  He is on prednisone chronically.  Will increase to 60 mg for the next 5 days for a burst.  Will discharge with albuterol inhaler.  Discussed return precautions.  He has pulmonology follow-up.  At this time, I do not feel there is any life-threatening condition present. I have reviewed, interpreted and discussed all results (EKG, imaging, lab, urine as appropriate) and exam findings with patient/family. I have reviewed nursing notes and appropriate previous records.  I feel the patient is safe to be discharged home without further emergent workup and can continue workup as an outpatient as needed. Discussed usual and customary return precautions. Patient/family verbalize understanding and are comfortable with this plan.  Outpatient follow-up has been provided as needed. All questions have been answered.     EKG Interpretation  Date/Time:  Friday November 16 2019 04:39:17 EST Ventricular Rate:  115 PR Interval:    QRS Duration: 95 QT Interval:  342 QTC Calculation: 473 R Axis:   60 Text Interpretation: Sinus tachycardia Atrial premature complexes Probable anteroseptal infarct, old No significant change since last tracing Confirmed by Pryor Curia [phone]) on 11/16/2019 4:40:52 AM          [name] was evaluated in Emergency Department on 11/16/2019 for the symptoms described in the history of present illness. He was evaluated in the context of the global COVID-19 pandemic, which necessitated consideration that the patient might be at risk for infection with the SARS-CoV-2 virus that causes COVID-19. Institutional protocols and algorithms that pertain to the evaluation of patients at risk for COVID-19 are in  a state of rapid change based on information released by regulatory bodies including the CDC and federal and state organizations. These policies and algorithms were followed during the patient's care in the ED.      Janya Eveland, Delice Bison, DO 11/16/19 [phone]

## 2019-11-18 NOTE — Progress Notes (Signed)
Subjective:    Patient ID: James [name], male    DOB: 1960/09/09, 59 y.o.   MRN: [id number]  History of Present Illness: 08/23/18 59 y.o.M with advanced Copd   I saw this patient a week ago and prescribed antibiotics and prednisone for what I perceived to be persisting pneumonia.  The patient had a slight improvement but then worsened and came to the emergency room the end of the week and was admitted for 3 days for COPD exacerbation and lower lobe pneumonia.  Chest x-ray did show persistent infiltrates in the lower lobes.  The patient is now discharged and returns to the office for post hospital follow-up.  Below is the discharge summary Dc summary : Admit date: 07/31/2018 Discharge date: 08/01/2018  Time spent: 45 minutes  Recommendations for Outpatient Follow-up:  Patient will be discharged to home.  Patient will need to follow up with primary care provider within one week of discharge.  Patient should continue medications as prescribed.  Patient should follow a heart healthy diet.   Discharge Diagnoses:  Acute on chronic respiratory failure with hypoxia secondary to COPD Exacerbation and pneumonia Essential hypertension  Discharge Condition: Stable  Diet recommendation: heart healthy  Filed Weights  07/31/18 0231 07/31/18 0602 Weight: 85.3 kg 83.4 kg   History of present illness:  on 07/31/2018 by James [name] a 59 y.o.malewith medical history significant ofCOPD. Patient has had 3 prior admits for COPD / PNA thus far this year, most recently in May.  Patient has been being treated for COPD exacerbation and BLL CAP as outpatient by James [name] with levaquin and prednisone. Just finished levaquin yesterday. Prednisone remained at 46m daily with plans to initiate a slow taper.  Despite this, patient developed severe SOB and respiratory distress this evening. EMS called and patient noted to be satting 80% on RA. He is not on chronic O2  for his COPD.  Hospital Course:  Acute on chronic respiratory failure with hypoxia secondary to COPD Exacerbation and pneumonia -Patient noted to have oxygen saturations in the 80s on room air -Chest x-ray noted for pneumonia -Was recently placed on Levaquin and steroids by PCP approximately 1 week ago -On admission, patient continued to have wheezing well into his hospital stay day 1.  Patient currently still has some wheezing however has improved. -Was placed on Solu-Medrol, along with nebulizer treatments, azithromycin and ceftriaxone -Patient did require oxygen upon admission however has been able to ambulate and maintain oxygen saturations in the high 90s on room air. -Will discharge patient with prednisone taper, azithromycin and Ceftin. -Given that patient had oxygen saturations in the 80s on admission, it was thought that he would require several days in the hospital. Patient actually improved quicker than expected.  Essential hypertension -Continue losartan  Since discharge the patient is tried to go back to work but has extreme difficulty in the heat with this.  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  10/05/2018 This patient's not been seen since the end of July he had failed several follow-up visits.  He did see Dr. WShyrl Numbersend of August who did not make  major changes in his medication program.  He is to remain on Dulera and Incruse.  The patient after being admitted in July got readmitted early August and then again in early September.  Discharge summaries are as noted below.  During the August admission the patient underwent bronchoscopy and had mucous plugging  removed the culture showed normal flora Note this patient has not been actively smoking recently Admit date: 08/09/2018 Discharge date: 08/11/2018  Discharge Diagnoses:  Acute on chronic hypoxic respiratory failure/sepsis due to multifocal pneumonia COPD Alcohol/marijuana use Essential hypertension  History of present illness:  on 08/09/2018 by James [name] Kort D Northis a 59 y.o.malewith medical history significant ofCOPD and ETOH dependence presenting with respiratory distress.He woke up about 530 this AM and couldn't breathe. This just keeps happening. He felt similarly when hospitalized last week. He has been doing fine until today. No h/o OSA, not on CPAP. Not on home O2. +cough, productive of yellowish sputum. It is sometimes hard to get the sputum up. No fever. +wheezing since his last hospitalization. Quit smoking in 2014. He lives in a non-air-conditioned log cabin.  He was hospitalized from 7/27-28 for acute on chronic respiratory failure associated with COPD exacerbation and PNA. This was his 4th admission this year. He was seen by James [name] on 7/30 with plan for completion of course of Keflex and prednisone, as well as prednisone taper.  Hospital Course:  Acute on chronic hypoxic respiratory failure/sepsis due to multifocal pneumonia -Was recently hospitalized in late July for pneumonia and COPD exacerbation. Was discharged with antibiotics as well as prednisone taper. Patient followed up with Dr. Electra Paladino on 08/03/2018 with completion of Keflex and prednisone as well. -This is his fourth admission this year for similar presentation -Patient presented with cough and shortness of breath -Chest x-rayon admissionfindings consistent with right-sided multifocal pneumonia -CTA chest showed multifocal pneumonia on the right. COPD including emphysema. Negative for PE. -COVID negative -Upon admission, patient had leukocytosis, fever, tachycardia, tachypnea-  all resolving  -Noted to be hypoxic 88% on room air in the ED -Urine strep pneumoniaand legionellaantigensnegative -Sputum culture: Few GPC, GVR, rare yeast -Blood cultures show no growth -was placed on vancomycin, Zosyn, Solu-Medrol, supplemental oxygen -S/p bronchoscopy 8/6, pending culture results -CXR obtained after bronch on 8/6: almost complete clearing of the hazy infiltrates in the right lung -Discussed with James [name] (pulm), recommended prednisone taper starting with prednisone 40mg daily x 1 week, taper as an outpatient, along with Augmentin. He will follow up with the patient in one week. Given that infection cleared on xray so quickly, question if this is inflammatory vs infection.  -patient able to ambulate today without oxygen and maintained oxygen saturations in the mid 90s- therefore patient does not qualify for home oxygen -patient also had a swallow eval- mild aspiration risk  COPD -Continue nebulizer treatments, steroids, inhalers -Incentive spirometry, flutter valve  Alcohol/marijuana use -Discussed cessation -was placed on continue CIWA protocol  Essential hypertension -Continue Cozaar  Consultants PCCM- pulmonology   Procedures Bronchoscopy   Recommendations for Outpatient Follow-up:  1. Follow up with PCP in 1-2 weeks 2. Please obtain BMP/CBC in one week your next doctors visit.  3. Augmentin and azithromycin for 5 days 4. Advised to continue using bronchodilators every 4-6 hours for the next 4-5 days and then transition to as needed. 5. Advised to take his home prednisone, for next 3 days advised to increase it to 40 mg twice daily then resume back is 20 mg daily    Discharge Condition: Stable CODE STATUS: Full code Diet recommendation: 2 g salt   DC summary from 09/11/18  Adm 9/7  D/C 09/13/18 Brief/Interim Summary: Lonzy D Denherderis a 59 y.o.malewith history ofCOPD on prednisone, HTN, alcohol use disorder, marijuana use,tobacco user.  Patient presented secondary to dyspnea and found to have evidence of pneumonia and a COPD exacerbation in addition to sepsis physiology on admission.  There is concerns of possible right lower lobe pneumonia therefore started on Unasyn and azithromycin due to concerns of aspiration.  This was transitioned to Augmentin and azithromycin for total of 5 days at time of discharge.  Secondary to infection, he was having COPD exacerbation therefore on bronchodilators.  He started improving and on the day of discharge he was adamant about going home as he was feeling much better.  He was still having abnormal breath sounds and cough therefore I had requested him to stay another day in the hospital but still wanted to go home.  Instructions were given to him take 40 mg of prednisone for next 3 days and then he can resume back his 20 mg daily.  Advised... home bronchodilators periodically every 4-6 hours for next 4-5 days and then transition to as needed. Advised to follow-up with his PCP in about 1 week.   Discharge Diagnoses:  Active Problems:   Hypertension   COPD exacerbation (HCC)   Marijuana abuse   Pneumonia   Sepsis (HCC)  Acute respiratory distress secondary to right lower lobe pneumonia, community-acquired/aspiration Acute mild exacerbation of COPD Concern for possible aspiration.  Unasyn and azithromycin transition to Augmentin and azithromycin.  We will give him 5 more day course at the time of discharge 40 mg of prednisone for next 3 days then transition to 20 mg daily which is his home dose Advised to use bronchodilators.3  Alcohol use disorder Counseled to quit drinking alcohol.  Advised to continue thiamine, folate and multivitamin  Weakness -He did well with physical therapy.  Tobacco use Marijuana use Cessation discussed on admission  Consultations:  None  Subjective: Still having some cough and congestion but he is adamant about going home today as he is feeling much  better than from admission.  I recommended he stay in the hospital for at least 1 more day to help him feel even better.  He wished to go home. Ambulating in the hallway without additional saturating greater than 90% on room air.   Since discharge this patient went to urgent care on 21 September with an outbreak of shingles over his left lower back.  He is taken a course of valacyclovir and prednisone.  He has severe pain.  He was given gabapentin but this caused hallucinations and then we tried tramadol but this caused severe nausea and vomiting.  Apparently the patient cannot take Tylenol or nonsteroidal anti-inflammatories as well.  The patient states his breathing is been labored.  He still has productive cough of white mucus.  He has no fever.  He has significant back pain because of the shingles. The patient is maintaining the Dulera and Incruse as well as his antihypertensive medications and vitamins     10/12/2018  This patient is seen in return follow-up for acute shingles with postherpetic neuropathy, recent pneumonia, COPD exacerbation, and hypertension.  Patient is markedly improved from the last visit.  He has less cough less shortness of breath.  He has improved pain around the area of the shingles.  He is taking amitriptyline 25 mg at bedtime  The   patient notes less pain.  He does state he had been using the tramadol without nausea or vomiting.  I asked if patient would be interested in the para medicine program and expressed interest in the community para medicine program  11/23/2018 Patient seen in return follow-up and has noted increasing shortness of breath and we had to resume prednisone this patient.  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  He does maintain his inhalers.  He is drinking 4 beers a night.  He is not smoking.  Note the patient continues to live in a home that has standing water under the foundation and in several the rooms and mold on the carpet.  We have  connected this patient with legal aid to see if they can assist him in getting into better housing.  The patient's not had previous allergy testing   12/13/2018 The patient notes increased shortness of breath, wheezing and cough productive of thick yellow mucus.  He is using the flutter valve but is not being is productive with this.  He denies chest pain or fever.  He is down to 2 a day on the prednisone.  He is maintaining his inhalers. Apparently there is repair is being done on the patient's home and he is decided to stay in the home  02/27/2019 This patient is seen in return follow-up for COPD.  The patient has significant mold exposure in the home.  There has been some mitigation of this but is not complete.  The patient just was in the urgent care for COPD exacerbation a week ago treated with a course of antibiotics and prednisone.  He is now off prednisone. Patient states the pain in the back has resolved however he is still having increased shortness of breath.  He still having productive cough of thick yellow mucus.  05/01/2019  Since the last OV the patient has been admitted for Copd flare again. Below is the discharge summary Dc summary:  Admit Date: 04/23/2019 Discharge date: 04/27/2019  Recommendations for Outpatient Follow-up:  1. Follow up with PCP in 1-2 weeks 2. Please obtain CMP/CBC in one week 3. Please repeat chest x-ray in the next 6 to 8 weeks to document resolution of infiltrates, if not may require further work-up.     Brief Narrative: Patient is a77 y.o.malewith history of COPD, intellectual disability, HTN who presented with shortness of breath-found to have acute hypoxic respiratory failure secondary to pneumonia and COPD exacerbation. Patient initially required BiPAP on admission. See below for further details.  Significant events: 4/19>> admit to [facility] for severe hypoxemia requiring BiPAP 4/20>> liberated from BiPAP  Antimicrobial  therapy: Rocephin 4/18>>4/23 Zithromax 4/18>>4/22  Microbiology data: Blood culture: 4/19>> negative  Procedures : None  Consults: None  Brief Hospital Course: Acute hypoxic/hypercarbic respiratory failure secondary to COPD exacerbation and pneumonia:Improved-initially required BiPAP on admission-titrated off BiPAP on 4/20.Now on room air with stable O2 saturations.  Feels much better-treated with Rocephin and Zithromax-we will transition to [facility] for a few more days on discharge.  He will resume his usual inhaler regimen-he will be placed on 20 mg of p.o. prednisone until he is seen by James [name].  PCP to repeat a chest x-ray in 6 to 8 weeks to document resolution of infiltrates-if not he may require further work-up to rule out underlying malignancy.  (Per patient's sister-patient probably has been taking more prednisone than he should be-he is unable to read-and is not able to follow directions)  HTN: BPstable-continue losartan  EtOH abuse:No signs  of withdrawal-managed with  Ativan per protocol. Per patient's sister-he drinks around 2-3 bottles of beer on a daily basis.  Intellectual disability:Spoke at length with patient's sister ([name]) today-patient isa illiterate-and has been living on his own for all his life. His other sister lives a mileaway and checks on him daily. Sister acknowledges poor living conditions-but apparently this is the way that patient has lived all his life-and chooses to live this way. Patient does odd jobs and sustainsa livelihood. Patient is currently managing all his finances. [name] does acknowledge that patient will not let any home health services come to his house-he will not go to SNF. Difficult situation-but even though patient is an illiterate/intellectual disability-he understand that he is somewhat deconditioned from his acute illness-and is in need of some help-he does not want therapy services-he understands the risk of falls from  being weak.  This MD had numerous discussions with him regarding his appropriate disposition-all he wants to do is go home-his sister-[name] wants to have him come to her place in [place], [place] for a few weeks-however the patient really does not want to go there-and wants to go home.  I have discussed at length with social worker as well-at this point he is being discharged home at his own request.  His family will keep an eye on him.   Nutrition Problem: Nutrition Problem: Increased nutrient needs Etiology: chronic illness(COPD) Signs/Symptoms: estimated needs Interventions: Ensure Enlive (each supplement provides 350kcal and 20 grams of protein)  Discharge Diagnoses:  Principal Problem:   Acute on chronic respiratory failure with hypoxia and hypercapnia (HCC) Active Problems:   Hypertension   COPD exacerbation (HCC)   Community acquired pneumonia of right lower lobe of lung   Marijuana abuse   Intellectual disability  Since discharge the patient has completed his antibiotics for his right lower lobe pneumonia.  He is on tapering prednisone.  He has developed a rash on his arms and face.  The rash is pruritic in nature.  He has been maintaining his medications as prescribed.   07/23/2019 Since the last visit this patient was readmitted to the hospital between 30 June and 3 July.  Below is a copy of the discharge summary Discharge Diagnosis: 1. Acute hypoxic respiratory 2/2 COPD exacerbation vs pneumonitis 2. GERD 3. EtOH history 4. HTN  Discharge Medications: Allergies as of 07/07/2019     Reactions  Gabapentin Other (See Comments)  hallucinations  Disposition and follow-up:   James [name] was discharged from [facility] in Stable condition.  At the hospital follow up visit please address:  1.  COPD exacerbation: Continue daily Albuterol, Dulera, Spiriva, and prednisone 40 mg daily. Sent home with 1 additional day of azithromycin.  Discharged on 2L [place]. F/u allergy labs.  GERD: Severe reflux noted on esophagram, started on pantoprazole daily, may need GI follow up.   2.  Labs / imaging needed at time of follow-up: None  3.  Pending labs/ test needing follow-up: IgE, allergen panel for mold, hypersensitivity pneumonitis  Note since discharge the patient's breathing is back to baseline.  The patient does have some wheezing and minimal productive cough.  Noticed blood pressures ranged up at this visit.  He is maintaining Dulera Spiriva as before he has been maintained on prednisone 40 mg daily and also discharged on 2 L oxygen.  He no longer is actively working at this time.  Note his mold assay did show a mild hypersensitivity to some yeast.  08/23/2019   Patient returns today in follow-up showing increased shortness of breath.  He continues to work outdoors in the heat and this is exacerbating his condition despite being on 20 mg of prednisone daily.  He notes thick yellow mucus that he is coughing up.  He notes increased wheeze.  He has ran out of his Spiriva        10/02/2019 This patient returns after having been in the hospital again for 4 days with discharge summary as noted below. Patient returns today with continued shortness of breath and productive cough. Mucus is still thick and yellow and difficult to raise. He has chest tightness. He still drinking 6-7 beers daily. He is no longer smoking. He is now on oxygen 4 L continuous. He has had his Medicaid approved and is now fully declared disabled. 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 The landlord has a complex relationship with this patient. Patient lives rent free and a home on the landlord's property. The home is not in good repair as documented previously.   Date of Admission: 09/22/2019  3:42 AM Date of Discharge: 09/25/2019 Attending Physician: Lenice Pressman, MD PhD  Discharge Diagnosis: 1. COPD exacerbation, underlying  bronchiectasis 2. Alcohol use disorder 3. Skin rash    Disposition and follow-up:   Mr.Jayvion D Eltringham was discharged from [facility] in Stable condition.  At the hospital follow up visit please address:  1.  Follow up      A. COPD exacerbation, recurrent pneumonias, underlying bronchiectasis - hx of recurrent pneumonia, see hospital course for details on prior workup, follow up with his pulmonologist      B. Alcohol use disorder - had mild withdrawal symptoms, motivated to quit, consider pharmacologic assistance such as naltrexone      C. Skin rash - not improving with triamcinolone, referred to dermatology  2.  Labs / imaging needed at time of follow-up: skin biopsy  3.  Pending labs/ test needing follow-up: none  Follow-up Appointments:  Hospital Course by problem list:  COPDexacerbation Recurrent pneumonia Bronchiectasis on bronchoscopy Presents with increased SOB, but cough baseline and not productive. He has had similar symptoms many times previously. PFTs in April 2021 with FEV1/FVC which was 54.01% of expected. CXR this admission with vague R lower lung field opacity, same location as opacity on prior CXRs. Has had recurrent pneumonia as far back as 2015. bronchoscopy 08/10/2018 for recurrent PNU demonstrating severe chronic bronchitis and bronchiectasis,neutrophil predominance and transbronchial biopsies with benign lung tissue with inflammation. CT on 07/05/19 with no ILD, no centrally obstructing lesion. Esophagram on 07/06/19 with no stenosis but severe reflux which could be contributing to COPD. Other pulm workup significant for IgE WNL, allergen profile for mold equivocal for Candida albicans but negative for all others, negative hypersensitivity pneumonitis panel. Has very good outpatient follow up with pulmonology. Is on Dulera, Spiriva, and albuterol and also prednisone 47m daily, states he has been compliant with all these, though at one point earlier  this admission denied steroid use. Treated with ceftriaxone, azithromycin, and prednisone initially with rapid improvement in SOB. No fevers this admission, cough remains unproductive so did not culture. Discharged on his home Dulera, Spiriva, albuterol, Prednisone. Discharged with oral azithromycin to complete 5 day course.   Alcohol Use Disorder Reports using 2x 24oz beers per day, which is decreased from prior. No hx of withdrawal seizures. CIWA scores max of 15 indicating mild withdrawal, requiring relatively low  amounts of Ativan. On day of discharge had scores of up to 5. Patient is motivated to quit drinking because "I want to live longer." Discharged with PO thiamine.  Skin rash Reports a rash for the past several months on his abdomen and both arms. His PCP has been treating this with triamcinolone, but this has not really helped. Referral provided to dermatology for biopsy/further assessment.  Note the patient's rash on both forearms is still persistent and triamcinolone has not helped  This patient will need a dermatology referral as per discharge summary. Note the patient is still actively drinking alcohol. See shortness of breath assessment below  11/06/2019 This patient is seen by way of a telephone visit and post hospital.  He was just admitted again for recurrent pneumonia this time with Covid infection.  This patient was intubated was severely ill.  He is now on oxygen 4 L continuous and 40 mg daily of prednisone.  He also has a allergic dermatitis rash.  This has not been responsive to steroids and is quite pruritic despite hydroxyzine when seen recently at urgent care.  I went over with this patient at this visit the need to receive assisted care but he refuses and wishes to stay in his current living arrangement which has very poor environment  Below is a copy of the recent discharge summary  Admission date:  10/19/2019  Admitting Physician  No admitting provider for  patient encounter.  Discharge Date:  10/26/2019   Primary MD  Hien [name] E, MD  Recommendations for primary care physician for things to follow:  -Patient instructed to follow with PCP/pulmonary as an outpatient, appointment will be rescheduled till he is off quarantine as discussed with the patient and staff. -Please check CBC, CMP during next visit.  Admission Diagnosis  COPD exacerbation (HCC) (J44.1) Acute on chronic respiratory failure with hypoxia (HCC) (J96.21) Pneumonia due to COVID-19 virus (U07.1, J12.82)   Discharge Diagnosis  COPD exacerbation (HCC) (J44.1) Acute on chronic respiratory failure with hypoxia (HCC) (J96.21) Pneumonia due to COVID-19 virus (U07.1, J12.82)    Active Problems:   Acute on chronic respiratory failure with hypoxia (HCC)   Pneumonia due to COVID-19 virus      Past Medical History: Diagnosis Date . Acute on chronic respiratory failure with hypoxia (HCC) 06/08/2013 . CAP (community acquired pneumonia) 05/17/2018  See admit 05/17/18 ? rml  with   covid pcr neg - rx augmentin > f/u cxr in 4-6 weeks is fine unless condition declines  . Community acquired pneumonia of right lower lobe of lung 05/17/2018  See admit 05/17/18 ? rml  with   covid pcr neg - rx augmentin > f/u cxr in 4-6 weeks is fine unless condition declines  . COPD (chronic obstructive pulmonary disease) (HCC)   not on home . Diabetes (HCC)  . Dyspnea  . Hypertension  . Pneumonia 04/06/2016 . Pneumothorax   2016, fell from ladder . 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 Tick bite 06/03/2018   Past Surgical History: Procedure Laterality Date . VIDEO ASSISTED THORACOSCOPY (VATS)/THOROCOTOMY Left 06/11/2013  Procedure: VIDEO ASSISTED THORACOSCOPY (VATS)/THOROCOTOMY;  Surgeon: Peter Van Trigt, MD;  Location: [facility] OR;  Service: Thoracic;  Laterality: Left; . VIDEO BRONCHOSCOPY N/A 06/11/2013  Procedure: VIDEO BRONCHOSCOPY;  Surgeon: Peter Van Trigt, MD;  Location: [facility]  OR;  Service: Thoracic;  Laterality: N/A; . VIDEO BRONCHOSCOPY N/A 08/10/2018  Procedure: VIDEO BRONCHOSCOPY WITH FLUORO;  Surgeon: [name], Daniel C, MD;  Location: [facility] END[facility]COPY;  Service: Endoscopy;  Laterality: N/A;        History of present illness and  Hospital Course:     Kindly see H&P for history of present illness and admission details, please review complete Labs, Consult reports and Test reports for all details in brief  HPI  from the history and physical done on the day of admission by PCCM on 10/19/2019  Pt is encephelopathic; therefore, this HPI is obtained from chart review. James Robertsonis a 59 y.o.malewho has a PMH as outlined below.[facility]resented to [facility] 10/15 with significant dyspnea to the point that he could not speak in full sentences. He was placed on CPAP but did not respond; therefore, he was intubated.  After intubation, COVID returned positive.  He has been evaluated by pulmonary on prior admissions given recurrent PNA. He has bronchoscopy in Aug 2020 which showed severe bronchiectasis bilaterally, neutrophil predominance and transbronchial biopsies were benign.   Hospital Course   Oryon D Fridayis a 59 y.o.malewho has a PMH of chronic respiratory failure, COPD, diabetes, hypertension, presented to [facility] ED 10/15 with significant dyspnea to the point that he could not speak in full sentences. He was placed on CPAP but did not respond; therefore, he was intubated.After intubation, COVID returned positive. He has been evaluated by pulmonary on prior admissions given recurrent PNA. He has bronchoscopy in Aug 2020 which showed severe bronchiectasis bilaterally, neutrophil predominance and transbronchial biopsies were benign. Patient was successfully extubated 10/17, and he was transferred [facility].   Acute on chronic hypoxic Resp. Failure due to Acute Covid 19 Viral Pneumonitis during the ongoing 2020 Covid 19 Pandemic   -Unvaccinated, he is a critically ill on presentation required intubation.  He was successfully extubated after 48 hours, with increased oxygen requirement initially, he is currently back at his baseline oxygen requirement, report requiring 2 L at rest, 4 L with activity. -He is extubated 10/17. -Treated initially with IV steroids, he will be discharged on home dose prednisone 20 mg oral daily (this was confirmed with with the patient that he is at current dose, he was given a refill for next 30 days till he is seen by PCP) -He was treated with remdesivir during hospital stay, as well he was treated with baricitinib as well -Was encouraged to take his incentive spirometry and flutter valve at home and continue using them .   COPD exacerbation -Treated with antibiotics for 5 days, continue with bronchodilators, continue with steroid  .  Hypertension -Continue with home dose metoprolol, and Cozaar  Diabetes mellitus -We will hold home dose Metformin, will keep on sliding scale during hospital stay, A1c is 6.8  History of alcohol abuse -No evidence of withdrawal during hospital stay, continue with thiamine and folic acid on discharge  Discharge Condition:  stable   Patient states his breathing is back at baseline he states he is no longer drinking alcohol he states his blood sugars have been in the 100 range he complains of his left hand feeling numb and is not able to sleep at night due to agitation   11/13/19 This patient was seen in return follow-up after had been seen by way of a telephone visit a week ago.  He was just hospitalized note discharge summary is as above.  He had a Covid pneumonia and respiratory failure was on mechanical ventilator.  Today he comes in on 4 L oxygen nighttime only been on room air today is 95%.  He has advanced COPD.  He had significant alcohol abuse he states he is no longer   drinking alcohol.  He still complains of bilateral rashes on the arms and  the umbilical area.  See shortness of breath assessment below  11/19/2019 This patient is seen again in short-term follow-up after having just been seen last week.  He came to the emergency room on 12 November in acute distress.  He was given high-dose IV steroids intensive neb treatments and discharged.  The patient states since taking the Diflucan and triamcinolone cream his rash is improved on his arms.  He states his breathing is getting back to baseline.  He still using oxygen 4 L at bedtime.  He still has thick tenacious mucus with thick yellow mucus.  He is off all antibiotics.  He is on prednisone 40 mg daily.  The patient continues to adamantly refuse leaving his current housing.  He is not regularly checking his blood sugar at home.  See assessment below   Shortness of Breath This is a chronic problem. The current episode started more than 1 year ago. The problem occurs daily (qhs shortness of breath). The problem has been unchanged. Associated symptoms include a rash, sputum production and wheezing. Pertinent negatives include no chest pain, claudication, fever, headaches, hemoptysis, leg pain, leg swelling, orthopnea, PND, sore throat, syncope or vomiting. The symptoms are aggravated by any activity, lying flat and occupational exposure. Associated symptoms comments: gerd  . Risk factors include smoking. He has tried beta agonist inhalers, oral steroids and steroid inhalers for the symptoms. The treatment provided moderate relief. His past medical history is significant for COPD and pneumonia. There is no history of asthma, CAD, DVT, a heart failure or PE.    Past Medical History:  Diagnosis Date  . Acute on chronic respiratory failure with hypoxia ([place]) 06/08/2013  . CAP (community acquired pneumonia) 05/17/2018   See admit 05/17/18 ? rml  with   covid pcr neg - rx augmentin > f/u cxr in 4-6 weeks is fine unless condition declines   . Community acquired pneumonia of right lower lobe  of lung 05/17/2018   See admit 05/17/18 ? rml  with   covid pcr neg - rx augmentin > f/u cxr in 4-6 weeks is fine unless condition declines   . COPD (chronic obstructive pulmonary disease) ([place])    not on home  . Diabetes ([place])   . Dyspnea   . Hypertension   . Pneumonia 04/06/2016  . Pneumonia due to COVID-19 virus 10/19/2019  . Pneumothorax    2016, fell from ladder  . 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 Tick bite 06/03/2018     Family History  Problem Relation Age of Onset  . Heart disease Father   . COPD Sister      Social History   Socioeconomic History  . Marital status: Divorced    Spouse name: Not on file  . Number of children: Not on file  . Years of education: Not on file  . Highest education level: Not on file  Occupational History  . Occupation: Dealer  . Occupation: Painter  Tobacco Use  . Smoking status: Former Smoker    Packs/day: 1.50    Years: 40.00    Pack years: 60.00    Types: Cigarettes    Quit date: 2014    Years since quitting: 7.8  . Smokeless tobacco: Current User    Types: Chew  Vaping Use  . Vaping Use: Never used  Substance and Sexual Activity  . Alcohol use: Yes    Alcohol/week: 28.0 standard drinks  Types: 28 Cans of beer per week    Comment: 4 beers a night; + jitters with not drinking, denies DTs or seizures  . Drug use: Yes    Types: Marijuana    Comment: daily; quit using crack and methamphetamine about 5 months ago   . Sexual activity: Not Currently    Partners: Female  Other Topics Concern  . Not on file  Social History Narrative   Lives alone near [place] Determinants of Health   Financial Resource Strain:   . Difficulty of Paying Living Expenses: Not on file  Food Insecurity:   . Worried About Charity fundraiser in the Last Year: Not on file  . Ran Out of Food in the Last Year: Not on file  Transportation Needs:   . Lack of Transportation (Medical): Not on file  . Lack of Transportation  (Non-Medical): Not on file  Physical Activity:   . Days of Exercise per Week: Not on file  . Minutes of Exercise per Session: Not on file  Stress:   . Feeling of Stress : Not on file  Social Connections:   . Frequency of Communication with Friends and Family: Not on file  . Frequency of Social Gatherings with Friends and Family: Not on file  . Attends Religious Services: Not on file  . Active Member of Clubs or Organizations: Not on file  . Attends Archivist Meetings: Not on file  . Marital Status: Not on file  Intimate Partner Violence:   . Fear of Current or Ex-Partner: Not on file  . Emotionally Abused: Not on file  . Physically Abused: Not on file  . Sexually Abused: Not on file     Allergies  Allergen Reactions  . Gabapentin Other (See Comments)    hallucinations     Outpatient Medications Prior to Visit  Medication Sig Dispense Refill  . Accu-Chek Softclix Lancets lancets Use as instructed to check blood sugar once daily. (Patient taking differently: 1 each by Other route See admin instructions. Use as instructed to check blood sugar once daily.) 100 each 2  . albuterol (VENTOLIN HFA) 108 (90 Base) MCG/ACT inhaler Inhale 2 puffs into the lungs every 4 (four) hours as needed for wheezing or shortness of breath. 1 each 0  . Blood Glucose Monitoring Suppl (ACCU-CHEK GUIDE ME) w/Device KIT 1 each by Other route in the morning and at bedtime.     . Blood Glucose Monitoring Suppl (TRUE METRIX METER) w/Device KIT Use to measure blood sugar twice a day (Patient taking differently: 1 each by Other route See admin instructions. Use to measure blood sugar twice a day) 1 kit 0  . budesonide-formoterol (SYMBICORT) 160-4.5 MCG/ACT inhaler Inhale 2 puffs into the lungs 2 (two) times daily. 1 each 12  . fluconazole (DIFLUCAN) 100 MG tablet Take 1 tablet (100 mg total) by mouth daily for 7 days. 7 tablet 0  . folic acid (FOLVITE) 1 MG tablet Take 1 tablet (1 mg total) by mouth  daily. 90 tablet 0  . furosemide (LASIX) 20 MG tablet Take 1 tablet (20 mg total) by mouth daily as needed for edema (lower extremity). (Patient taking differently: Take 20 mg by mouth daily as needed for fluid or edema. ) 30 tablet 3  . glucose blood (TRUE METRIX BLOOD GLUCOSE TEST) test strip Use as instructed (Patient taking differently: 1 each by Other route as directed. Use as instructed) 100 each 12  . hydrOXYzine (ATARAX/VISTARIL) 25 MG  tablet Take 1 tablet (25 mg total) by mouth every 8 (eight) hours as needed for anxiety or itching. 30 tablet 0  . losartan (COZAAR) 100 MG tablet Take 1 tablet (100 mg total) by mouth daily. 30 tablet 2  . metFORMIN (GLUCOPHAGE) 500 MG tablet Take 1 tablet (500 mg total) by mouth 2 (two) times daily with a meal. 120 tablet 3  . metoprolol succinate (TOPROL-XL) 25 MG 24 hr tablet Take 1 tablet (25 mg total) by mouth daily. 90 tablet 3  . multivitamin-iron-minerals-folic acid (CENTRUM) chewable tablet Chew 1 tablet by mouth daily. 90 tablet 2  . pantoprazole (PROTONIX) 40 MG tablet Take 1 tablet (40 mg total) by mouth daily. 30 tablet 6  . Respiratory Therapy Supplies (FLUTTER) DEVI Use 4 times daily (Patient taking differently: 1 each by Other route See admin instructions. Use 4 times daily) 1 each 0  . Tiotropium Bromide Monohydrate (SPIRIVA RESPIMAT) 2.5 MCG/ACT AERS Two puff daily (Patient taking differently: Inhale 2 puffs into the lungs daily. ) 4 g 11  . triamcinolone cream (KENALOG) 0.1 % Apply 1 application topically 2 (two) times daily. 30 g 0  . albuterol (PROAIR HFA) 108 (90 Base) MCG/ACT inhaler Inhale 2 puffs into the lungs every 6 (six) hours as needed for wheezing or shortness of breath. 8.5 g 2  . albuterol (PROVENTIL) (2.5 MG/3ML) 0.083% nebulizer solution Take 3 mLs (2.5 mg total) by nebulization every 6 (six) hours as needed for wheezing or shortness of breath. 150 mL 0  . predniSONE (DELTASONE) 20 MG tablet Take 1 tablet (20 mg total) by mouth  daily with breakfast. (Patient taking differently: Take by mouth daily with breakfast. ) 60 tablet 1  . predniSONE (DELTASONE) 20 MG tablet Take 2 tablets (40 mg total) by mouth daily. 10 tablet 0  . thiamine (VITAMIN B-1) 100 MG tablet Take 1 tablet (100 mg total) by mouth daily. 30 tablet 3   No facility-administered medications prior to visit.     Review of Systems  Constitutional: Negative for fever.  HENT: Negative for sinus pressure, sinus pain, sore throat and trouble swallowing.   Eyes: Negative.   Respiratory: Positive for cough, sputum production, shortness of breath and wheezing. Negative for hemoptysis and chest tightness.   Cardiovascular: Negative for chest pain, palpitations, orthopnea, claudication, leg swelling, syncope and PND.  Gastrointestinal: Negative for nausea and vomiting.  Genitourinary: Negative.   Musculoskeletal: Positive for back pain.  Skin: Positive for rash.  Neurological: Negative for headaches.  Psychiatric/Behavioral: Negative for hallucinations. The patient is not nervous/anxious and is not hyperactive.        Objective:   Physical Exam Vitals:   11/19/19 0900  BP: (!) 163/100  Pulse: (!) 103  SpO2: 96%  Weight: 182 lb (82.6 kg)   Gen: Pleasant, well-nourished, in no distress,  normal affect  ENT: No lesions,  mouth clear,  oropharynx clear, no postnasal drip, edentulous  Neck: No JVD, no TMG, no carotid bruits  Lungs: No use of accessory muscles, no dullness to percussion, expiratory wheeze scattered rhonchi  Cardiovascular: RRR, heart sounds normal, no murmur or gallops, no peripheral edema  Abdomen: soft and NT, no HSM,  BS normal  Musculoskeletal: No deformities, no cyanosis or clubbing  Neuro: alert, non focal  Skin: Warm, no lesions , erythematous rash both forearms and umbilical area looks fungal in nature and has improved from the last visit    Disability eval at [facility]:   Pulmonary Function Test (04/19/2019  1:42 PM  EDT) Pulmonary Function Test (04/19/2019 1:42 PM EDT)  Component Value Ref Range Performed At Pathologist Signature  FVC Post 2.51 L [facility]   FEV1 Post 1.55 L [facility]   FEV1/FVC Post 61.87 % [facility]   FEF25-75% Post 0.90 L/s [facility]   PEF Post 3.42 L/s [facility]   MVV Post 42.00 L/min [facility]   FVC Pre 2.92 L [facility]   FEV1 Pre 1.57 L [facility]   FEV1/FVC Pre 54.01 % [facility]   FEF25-75% Pre 0.68 L/s [facility]   PEF Pre 4.14 L/s [facility]   MVV Pre 45.00 L/min [facility]   FVC_LLN 3.73  [facility]   FVC_Z-SCORE -2.84  [facility]   FVC_%PRED 60 % % [facility]   FVC_Z-SCORE -2.84  [facility]   FVC_%PRED 52 % % [facility]   FVC %Chng -14 % % [facility]   FEV1_LLN 2.86  [facility]   FEV1_Z-SCORE -3.78  [facility]   FEV1_%PRED 42 % % [facility]   FEV1_Z-SCORE -3.78  [facility]   FEV1_%PRED 41 % % [facility]   FEV1 %Chng -2 % % [facility]   FEV1/FVC_LLN 66  [facility]   FEV1/FVC %Chng 15 % % [facility]   FEF25-75%_LLN 1.60  [facility]   FEF25-75%_Z-SCORE -3.05  [facility]   FEF25-75%_%PRED 21 % % [facility]   FEF25-75%_Z-SCORE -3.05  [facility]   FEF25-75%_%PRED 28 % % [facility]   FEF25-75% %Chng 33 % % [facility]   PEF_LLN 7.25  [facility]   PEF_%PRED 43 % 36 % % [facility]   PEF %Chng -17 % % [facility]   MVV_LLN 130  [facility]   MVV_%PRED 35 % 32 % % [facility]   MVV %Chng -7 % % [facility]     Media Information    Document Information  Photos    11/02/2018 14:43  Attached To:  James [name]  Source Information  James Stain, MD  [facility]   Pertinent Labs, Studies, and Procedures:  DG Chest Portable 1 View  Result Date: 09/22/2019 CLINICAL DATA:  Dyspnea. COPD exacerbation. Chest pain and shortness of breath. EXAM: PORTABLE CHEST 1 VIEW COMPARISON:  Cyst 07/05/2019 FINDINGS: Stable cardiomediastinal contours. Chronic blunting of the left costophrenic angle due to pleuroparenchymal scarring. Changes of emphysema noted with asymmetric increase hazy opacity within the right lung base concerning for pneumonia. Numerous remote bilateral rib fracture deformities. IMPRESSION: 1. Increased hazy opacity within the right lung base concerning for pneumonia. 2. Emphysema. Electronically Signed   By: James [name] M.D.   On: 09/22/2019 04:20     BMP Latest Ref Rng & Units 11/16/2019 10/24/2019 10/23/2019  Glucose 70 - 99 mg/dL 113(H) 130(H) 139(H)  BUN 6 - 20 mg/dL 15 31(H) 34(H)  Creatinine 0.61 - 1.24 mg/dL 0.98 0.73 0.66  BUN/Creat Ratio 9 - 20 - - -  Sodium 135 - 145 mmol/L 144 139 138  Potassium 3.5 - 5.1 mmol/L 4.0 4.6 4.6  Chloride 98 - 111 mmol/L 103 99 99  CO2 22 - 32 mmol/L $RemoveB'27 31 30  'TzkFoUIF$ Calcium 8.9 - 10.3 mg/dL 9.4 9.4 9.2   Hepatic Function Panel     Component Value Date/Time   PROT 5.0 (L) 10/20/2019 0050   PROT 5.9 (L) 07/23/2019 0900   ALBUMIN 2.8 (L) 10/20/2019 0050   ALBUMIN 4.0 07/23/2019 0900   AST 18 10/20/2019 0050   ALT 22 10/20/2019 0050   ALT 62 (H) 06/23/2016 1226   ALKPHOS 36 (L) 10/20/2019 0050   BILITOT 0.7 10/20/2019 0050   BILITOT <0.2 07/23/2019 0900   BILIDIR <0.1 10/20/2019 0050   BILIDIR 0.12 04/16/2016 1601   IBILI NOT CALCULATED 10/20/2019 0050    CBC Latest Ref Rng & Units 11/16/2019 10/24/2019 10/21/2019  WBC 4.0 - 10.5 K/uL 10.4 17.3(H) 23.8(H)  Hemoglobin 13.0 - 17.0 g/dL 13.4 13.9 13.0  Hematocrit 39 - 52 % 42.1 43.0 40.8  Platelets 150 - 400 K/uL 301 300 286       Assessment & Plan:  I personally reviewed all images and lab data in the [facility] system as well as any outside material available during this office visit and agree with the  radiology impressions.   COPD mixed type ([place]) Chronic obstructive lung disease with poor HFA technique I reviewed his inhaler technique today and he was inhaling once with 2 activations on his inhaler I reinstructed him as to proper use of the inhalers  Plan will be to maintain prednisone 40 mg a day for an additional week then reduce to 20 mg daily thereafter I will give an additional course of ciprofloxacin for persistent tracheobronchitis  Patient to return in follow-up in 3 weeks  Type 2 diabetes mellitus without complication, without long-term current use of insulin (HCC) Continue Metformin as prescribed  Other atopic dermatitis Improved with the addition of Diflucan  History of alcohol use Currently not drinking alcohol at this time  Hypertension Continue current blood pressure medications  Chronic respiratory failure with hypoxia (HCC) Continue oxygen therapy as prescribed   Elliot was seen today for follow-up.  Diagnoses and all orders for this visit:  COPD mixed type ([place])  Type 2 diabetes mellitus without complication, without long-term current use of insulin ([place])  Other atopic dermatitis  History of alcohol use  Primary hypertension  Chronic respiratory failure with hypoxia (HCC)  Other orders -     predniSONE (DELTASONE) 20 MG tablet; Take 2 tablet daily for 7 days then 1 daily and STAY -     albuterol (PROVENTIL) (2.5 MG/3ML) 0.083% nebulizer solution; Take 3 mLs (2.5 mg total) by nebulization in the morning, at noon, and at bedtime. -     ciprofloxacin  (CIPRO) 500 MG tablet; Take 1 tablet (500 mg total) by mouth 2 (two) times daily for 5 days.

## 2019-11-19 ENCOUNTER — Other Ambulatory Visit: Payer: Self-pay

## 2019-11-19 ENCOUNTER — Telehealth: Payer: Self-pay

## 2019-11-19 ENCOUNTER — Other Ambulatory Visit: Payer: Self-pay | Admitting: Critical Care Medicine

## 2019-11-19 ENCOUNTER — Ambulatory Visit: Payer: MEDICAID | Attending: Critical Care Medicine | Admitting: Critical Care Medicine

## 2019-11-19 ENCOUNTER — Encounter: Payer: Self-pay | Admitting: Critical Care Medicine

## 2019-11-19 DIAGNOSIS — J449 Chronic obstructive pulmonary disease, unspecified: Secondary | ICD-10-CM | POA: Diagnosis not present

## 2019-11-19 DIAGNOSIS — E119 Type 2 diabetes mellitus without complications: Secondary | ICD-10-CM | POA: Diagnosis not present

## 2019-11-19 DIAGNOSIS — J9611 Chronic respiratory failure with hypoxia: Secondary | ICD-10-CM

## 2019-11-19 DIAGNOSIS — I10 Essential (primary) hypertension: Secondary | ICD-10-CM | POA: Diagnosis not present

## 2019-11-19 DIAGNOSIS — Z87898 Personal history of other specified conditions: Secondary | ICD-10-CM | POA: Diagnosis not present

## 2019-11-19 DIAGNOSIS — L2089 Other atopic dermatitis: Secondary | ICD-10-CM

## 2019-11-19 MED ORDER — CIPROFLOXACIN HCL 500 MG PO TABS: 500.0000 mg | ORAL_TABLET | Freq: Two times a day (BID) | ORAL | 0 refills | Status: DC

## 2019-11-19 MED ORDER — ALBUTEROL SULFATE (2.5 MG/3ML) 0.083% IN NEBU: 2.5000 mg | INHALATION_SOLUTION | Freq: Three times a day (TID) | RESPIRATORY_TRACT | 1 refills | Status: DC

## 2019-11-19 MED ORDER — PREDNISONE 20 MG PO TABS: ORAL_TABLET | ORAL | 1 refills | Status: DC

## 2019-11-19 MED FILL — ALBUTEROL SUL 2.5 MG/3 ML S: (2.5 MG/3ML | 14 days supply | Qty: 270 | Fill #0

## 2019-11-19 MED FILL — CIPROFLOXACIN HCL 500 MG TA: 500 | 5 days supply | Qty: 10 | Fill #0

## 2019-11-19 MED FILL — predniSONE 20 MG TABS: 20 | 30 days supply | Qty: 37 | Fill #0

## 2019-11-19 NOTE — Assessment & Plan Note (Signed)
Continue oxygen therapy as prescribed 

## 2019-11-19 NOTE — Assessment & Plan Note (Signed)
Currently not drinking alcohol at this time

## 2019-11-19 NOTE — Assessment & Plan Note (Signed)
Chronic obstructive lung disease with poor HFA technique I reviewed his inhaler technique today and he was inhaling once with 2 activations on his inhaler I reinstructed him as to proper use of the inhalers  Plan will be to maintain prednisone 40 mg a day for an additional week then reduce to 20 mg daily thereafter I will give an additional course of ciprofloxacin for persistent tracheobronchitis  Patient to return in follow-up in 3 weeks

## 2019-11-19 NOTE — Assessment & Plan Note (Signed)
Continue Metformin as prescribed

## 2019-11-19 NOTE — Patient Instructions (Signed)
Stay on prednisone to 20 mg tablets twice daily for 7 more days then reduce to 1 daily and hold  Use your albuterol nebulizer 3 times daily  Take Cipro 1 twice daily for 5 days  Remember to use the Symbicort 2 inhalations twice daily 1 inhalation for each activation  No other medication changes at this time  We are going to partner with you in attempting to find improved housing for you  Please consider obtaining a dental consult for dentures at a later date when your lungs are improving  Return to see [name] 2 weeks

## 2019-11-19 NOTE — Assessment & Plan Note (Signed)
Improved with the addition of Diflucan

## 2019-11-19 NOTE — Telephone Encounter (Signed)
Met with the patient when he was in the clinic today. Regarding his housing, he said he is not interested in moving despite the sub par living condition. He said that it is his home and he feels comfortable there. He does not want to live in an apartment and does not like the idea of moving at all even though his health could benefit from the change but he said he would think about it.   He said that he wanted his sister, Ermalinda Barrios, removed as a contact and he needs a new advanced directive without Ermalinda Barrios as his representative.  He was in agreement to having this CM contact his sister, [name], to discuss this matter.  He was provided with a new advanced directive booklet which he said he would share with [name].

## 2019-11-19 NOTE — Assessment & Plan Note (Signed)
Continue current blood pressure medications

## 2019-11-20 NOTE — Telephone Encounter (Signed)
Call placed to the patient this morning regarding community paramedicine program.  He was very pleased to be starting the program again.  He said that he gave the Advanced Directives booklet to his sister, [name], to review.  DPR on file to speak with [name] but no one else. Sister, Harlan Ervine as listed as a contact. The patient stated he preferred to direct communication to [place].   Call placed to [facility].  She said that she received the Advanced Directives booklet from [place] and will review, they will update and have notarized.   She was pleased to learn that Monia Pouch would be seeing the patient again.    Community paramedicine referral faxed to [facility]

## 2019-11-27 ENCOUNTER — Other Ambulatory Visit: Payer: Self-pay

## 2019-11-27 ENCOUNTER — Other Ambulatory Visit (HOSPITAL_COMMUNITY): Payer: Self-pay

## 2019-11-27 NOTE — Progress Notes (Signed)
Paramedicine Encounter    Patient ID: James [name], male    DOB: 12/21/1960, 59 y.o.   MRN: [id number]   Patient Care Team: [name], MD as PCP - General (Pulmonary Disease) O'Neal, Cassie Freer, MD as PCP - Cardiology (Cardiology) [name], RN as Case Manager [name], [facility] as Pharmacist (Pharmacist)  Patient Active Problem List   Diagnosis Date Noted  . Bronchiectasis ([place]) 09/24/2019  . Type 2 diabetes mellitus without complication, without long-term current use of insulin ([place]) 07/24/2019  . Other atopic dermatitis 05/01/2019  . Chronic respiratory failure with hypoxia ([place]) 04/23/2019  . Intellectual disability 04/23/2019  . History of alcohol use 11/23/2018  . Hypertension 05/17/2016  . History of tobacco abuse 04/06/2016  . COPD mixed type ([place]) 03/26/2016    Current Outpatient Medications:  .  Accu-Chek Softclix Lancets lancets, Use as instructed to check blood sugar once daily. (Patient taking differently: 1 each by Other route See admin instructions. Use as instructed to check blood sugar once daily.), Disp: 100 each, Rfl: 2 .  albuterol (PROVENTIL) (2.5 MG/3ML) 0.083% nebulizer solution, Take 3 mLs (2.5 mg total) by nebulization in the morning, at noon, and at bedtime., Disp: 270 mL, Rfl: 1 .  albuterol (VENTOLIN HFA) 108 (90 Base) MCG/ACT inhaler, Inhale 2 puffs into the lungs every 4 (four) hours as needed for wheezing or shortness of breath., Disp: 1 each, Rfl: 0 .  Blood Glucose Monitoring Suppl (ACCU-CHEK GUIDE ME) w/Device KIT, 1 each by Other route in the morning and at bedtime. , Disp: , Rfl:  .  Blood Glucose Monitoring Suppl (TRUE METRIX METER) w/Device KIT, Use to measure blood sugar twice a day (Patient taking differently: 1 each by Other route See admin instructions. Use to measure blood sugar twice a day), Disp: 1 kit, Rfl: 0 .  budesonide-formoterol (SYMBICORT) 160-4.5 MCG/ACT inhaler, Inhale 2 puffs into the lungs 2 (two) times  daily., Disp: 1 each, Rfl: 12 .  folic acid (FOLVITE) 1 MG tablet, Take 1 tablet (1 mg total) by mouth daily., Disp: 90 tablet, Rfl: 0 .  glucose blood (TRUE METRIX BLOOD GLUCOSE TEST) test strip, Use as instructed (Patient taking differently: 1 each by Other route as directed. Use as instructed), Disp: 100 each, Rfl: 12 .  losartan (COZAAR) 100 MG tablet, Take 1 tablet (100 mg total) by mouth daily., Disp: 30 tablet, Rfl: 2 .  metFORMIN (GLUCOPHAGE) 500 MG tablet, Take 1 tablet (500 mg total) by mouth 2 (two) times daily with a meal., Disp: 120 tablet, Rfl: 3 .  metoprolol succinate (TOPROL-XL) 25 MG 24 hr tablet, Take 1 tablet (25 mg total) by mouth daily., Disp: 90 tablet, Rfl: 3 .  pantoprazole (PROTONIX) 40 MG tablet, Take 1 tablet (40 mg total) by mouth daily., Disp: 30 tablet, Rfl: 6 .  predniSONE (DELTASONE) 20 MG tablet, Take 2 tablet daily for 7 days then 1 daily and STAY, Disp: 60 tablet, Rfl: 1 .  Respiratory Therapy Supplies (FLUTTER) DEVI, Use 4 times daily (Patient taking differently: 1 each by Other route See admin instructions. Use 4 times daily), Disp: 1 each, Rfl: 0 .  Tiotropium Bromide Monohydrate (SPIRIVA RESPIMAT) 2.5 MCG/ACT AERS, Two puff daily (Patient taking differently: Inhale 2 puffs into the lungs daily. ), Disp: 4 g, Rfl: 11 .  triamcinolone cream (KENALOG) 0.1 %, Apply 1 application topically 2 (two) times daily., Disp: 30 g, Rfl: 0 .  furosemide (LASIX) 20 MG tablet, Take 1 tablet (  20 mg total) by mouth daily as needed for edema (lower extremity). (Patient not taking: Reported on 11/27/2019), Disp: 30 tablet, Rfl: 3 .  hydrOXYzine (ATARAX/VISTARIL) 25 MG tablet, Take 1 tablet (25 mg total) by mouth every 8 (eight) hours as needed for anxiety or itching. (Patient not taking: Reported on 11/27/2019), Disp: 30 tablet, Rfl: 0 .  multivitamin-iron-minerals-folic acid (CENTRUM) chewable tablet, Chew 1 tablet by mouth daily. (Patient not taking: Reported on 11/27/2019), Disp:  90 tablet, Rfl: 2 .  thiamine (VITAMIN B-1) 100 MG tablet, Take 1 tablet (100 mg total) by mouth daily. (Patient not taking: Reported on 11/27/2019), Disp: 30 tablet, Rfl: 3 Allergies  Allergen Reactions  . Gabapentin Other (See Comments)    hallucinations      Social History   Socioeconomic History  . Marital status: Divorced    Spouse name: Not on file  . Number of children: Not on file  . Years of education: Not on file  . Highest education level: Not on file  Occupational History  . Occupation: Dealer  . Occupation: Painter  Tobacco Use  . Smoking status: Former Smoker    Packs/day: 1.50    Years: 40.00    Pack years: 60.00    Types: Cigarettes    Quit date: 2014    Years since quitting: 7.8  . Smokeless tobacco: Current User    Types: Chew  Vaping Use  . Vaping Use: Never used  Substance and Sexual Activity  . Alcohol use: Yes    Alcohol/week: 28.0 standard drinks    Types: 28 Cans of beer per week    Comment: 4 beers a night; + jitters with not drinking, denies DTs or seizures  . Drug use: Yes    Types: Marijuana    Comment: daily; quit using crack and methamphetamine about 5 months ago   . Sexual activity: Not Currently    Partners: Female  Other Topics Concern  . Not on file  Social History Narrative   Lives alone near [place] Determinants of Health   Financial Resource Strain:   . Difficulty of Paying Living Expenses: Not on file  Food Insecurity:   . Worried About Charity fundraiser in the Last Year: Not on file  . Ran Out of Food in the Last Year: Not on file  Transportation Needs:   . Lack of Transportation (Medical): Not on file  . Lack of Transportation (Non-Medical): Not on file  Physical Activity:   . Days of Exercise per Week: Not on file  . Minutes of Exercise per Session: Not on file  Stress:   . Feeling of Stress : Not on file  Social Connections:   . Frequency of Communication with Friends and Family: Not on file  .  Frequency of Social Gatherings with Friends and Family: Not on file  . Attends Religious Services: Not on file  . Active Member of Clubs or Organizations: Not on file  . Attends Archivist Meetings: Not on file  . Marital Status: Not on file  Intimate Partner Violence:   . Fear of Current or Ex-Partner: Not on file  . Emotionally Abused: Not on file  . Physically Abused: Not on file  . Sexually Abused: Not on file    Physical Exam      Future Appointments  Date Time Provider [place]  12/03/2019  9:30 AM [name], MD [facility] None    BP 132/80   Pulse (!) 106  Resp 20   SpO2 98%  [facility]-123  Pt back to paramedicine for a few routine visits per [name] request.  Pt reports his breathing is doing ok.  Pt not been taking his CBG's. I reviewed this with him again on how to use.  He has redness and pain to the rt wrist area. He does not remember any injuries to that arm.  His 02 tubing [place] broke this morning-I have one I will leave him.  He states that rash has popped back up on his left arm area.  meds verified--he needs to get thiamine, folic acid and pantoprazole. I wrote that down for him to take to pharmacy to ensure he gets that filled.  Slight wheeze to the left lower lobe. Diminished to upper left clear on right.    [name], [facility] Paramedic  11/27/19

## 2019-12-01 NOTE — Progress Notes (Signed)
Subjective:    Patient ID: James [name], male    DOB: 1960/09/09, 59 y.o.   MRN: [id number]  History of Present Illness: 08/23/18 59 y.o.M with advanced Copd   I saw this patient a week ago and prescribed antibiotics and prednisone for what I perceived to be persisting pneumonia.  The patient had a slight improvement but then worsened and came to the emergency room the end of the week and was admitted for 3 days for COPD exacerbation and lower lobe pneumonia.  Chest x-ray did show persistent infiltrates in the lower lobes.  The patient is now discharged and returns to the office for post hospital follow-up.  Below is the discharge summary Dc summary : Admit date: 07/31/2018 Discharge date: 08/01/2018  Time spent: 45 minutes  Recommendations for Outpatient Follow-up:  Patient will be discharged to home.  Patient will need to follow up with primary care provider within one week of discharge.  Patient should continue medications as prescribed.  Patient should follow a heart healthy diet.   Discharge Diagnoses:  Acute on chronic respiratory failure with hypoxia secondary to COPD Exacerbation and pneumonia Essential hypertension  Discharge Condition: Stable  Diet recommendation: heart healthy  Filed Weights  07/31/18 0231 07/31/18 0602 Weight: 85.3 kg 83.4 kg   History of present illness:  on 07/31/2018 by Dr. [name] a 59 y.o.malewith medical history significant ofCOPD. Patient has had 3 prior admits for COPD / PNA thus far this year, most recently in May.  Patient has been being treated for COPD exacerbation and BLL CAP as outpatient by [name] with levaquin and prednisone. Just finished levaquin yesterday. Prednisone remained at 46m daily with plans to initiate a slow taper.  Despite this, patient developed severe SOB and respiratory distress this evening. EMS called and patient noted to be satting 80% on RA. He is not on chronic O2  for his COPD.  Hospital Course:  Acute on chronic respiratory failure with hypoxia secondary to COPD Exacerbation and pneumonia -Patient noted to have oxygen saturations in the 80s on room air -Chest x-ray noted for pneumonia -Was recently placed on Levaquin and steroids by PCP approximately 1 week ago -On admission, patient continued to have wheezing well into his hospital stay day 1.  Patient currently still has some wheezing however has improved. -Was placed on Solu-Medrol, along with nebulizer treatments, azithromycin and ceftriaxone -Patient did require oxygen upon admission however has been able to ambulate and maintain oxygen saturations in the high 90s on room air. -Will discharge patient with prednisone taper, azithromycin and Ceftin. -Given that patient had oxygen saturations in the 80s on admission, it was thought that he would require several days in the hospital. Patient actually improved quicker than expected.  Essential hypertension -Continue losartan  Since discharge the patient is tried to go back to work but has extreme difficulty in the heat with this.  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  10/05/2018 This patient's not been seen since the end of July he had failed several follow-up visits.  He did see Dr. WShyrl Numbersend of August who did not make  major changes in his medication program.  He is to remain on Dulera and Incruse.  The patient after being admitted in July got readmitted early August and then again in early September.  Discharge summaries are as noted below.  During the August admission the patient underwent bronchoscopy and had mucous plugging  removed the culture showed normal flora Note this patient has not been actively smoking recently Admit date: 08/09/2018 Discharge date: 08/11/2018  Discharge Diagnoses:  Acute on chronic hypoxic respiratory failure/sepsis due to multifocal pneumonia COPD Alcohol/marijuana use Essential hypertension  History of present illness:  on 08/09/2018 by Dr. Jennifer Yates James Robertsonis a 58 y.o.malewith medical history significant ofCOPD and ETOH dependence presenting with respiratory distress.He woke up about 530 this AM and couldn't breathe. This just keeps happening. He felt similarly when hospitalized last week. He has been doing fine until today. No h/o OSA, not on CPAP. Not on home O2. +cough, productive of yellowish sputum. It is sometimes hard to get the sputum up. No fever. +wheezing since his last hospitalization. Quit smoking in 2014. He lives in a non-air-conditioned log cabin.  He was hospitalized from 7/27-28 for acute on chronic respiratory failure associated with COPD exacerbation and PNA. This was his 4th admission this year. He was seen by Dr. Chanel Mckesson on 7/30 with plan for completion of course of Keflex and prednisone, as well as prednisone taper.  Hospital Course:  Acute on chronic hypoxic respiratory failure/sepsis due to multifocal pneumonia -Was recently hospitalized in late July for pneumonia and COPD exacerbation. Was discharged with antibiotics as well as prednisone taper. Patient followed up with Dr. Mario Coronado on 08/03/2018 with completion of Keflex and prednisone as well. -This is his fourth admission this year for similar presentation -Patient presented with cough and shortness of breath -Chest x-rayon admissionfindings consistent with right-sided multifocal pneumonia -CTA chest showed multifocal pneumonia on the right. COPD including emphysema. Negative for PE. -COVID negative -Upon admission, patient had leukocytosis, fever, tachycardia, tachypnea-  all resolving  -Noted to be hypoxic 88% on room air in the ED -Urine strep pneumoniaand legionellaantigensnegative -Sputum culture: Few GPC, GVR, rare yeast -Blood cultures show no growth -was placed on vancomycin, Zosyn, Solu-Medrol, supplemental oxygen -S/p bronchoscopy 8/6, pending culture results -CXR obtained after bronch on 8/6: almost complete clearing of the hazy infiltrates in the right lung -Discussed with Dr. Smith (pulm), recommended prednisone taper starting with prednisone 40mg daily x 1 week, taper as an outpatient, along with Augmentin. He will follow up with the patient in one week. Given that infection cleared on xray so quickly, question if this is inflammatory vs infection.  -patient able to ambulate today without oxygen and maintained oxygen saturations in the mid 90s- therefore patient does not qualify for home oxygen -patient also had a swallow eval- mild aspiration risk  COPD -Continue nebulizer treatments, steroids, inhalers -Incentive spirometry, flutter valve  Alcohol/marijuana use -Discussed cessation -was placed on continue CIWA protocol  Essential hypertension -Continue Cozaar  Consultants PCCM- pulmonology   Procedures Bronchoscopy   Recommendations for Outpatient Follow-up:  1. Follow up with PCP in 1-2 weeks 2. Please obtain BMP/CBC in one week your next doctors visit.  3. Augmentin and azithromycin for 5 days 4. Advised to continue using bronchodilators every 4-6 hours for the next 4-5 days and then transition to as needed. 5. Advised to take his home prednisone, for next 3 days advised to increase it to 40 mg twice daily then resume back is 20 mg daily    Discharge Condition: Stable CODE STATUS: Full code Diet recommendation: 2 g salt   DC summary from 09/11/18  Adm 9/7  D/C 09/13/18 Brief/Interim Summary: James Robertsonis a 58 y.o.malewith history ofCOPD on prednisone, HTN, alcohol use disorder, marijuana use,tobacco user.  Patient presented secondary to dyspnea and found to have evidence of pneumonia and a COPD exacerbation in addition to sepsis physiology on admission.  There is concerns of possible right lower lobe pneumonia therefore started on Unasyn and azithromycin due to concerns of aspiration.  This was transitioned to Augmentin and azithromycin for total of 5 days at time of discharge.  Secondary to infection, he was having COPD exacerbation therefore on bronchodilators.  He started improving and on the day of discharge he was adamant about going home as he was feeling much better.  He was still having abnormal breath sounds and cough therefore I had requested him to stay another day in the hospital but still wanted to go home.  Instructions were given to him take 40 mg of prednisone for next 3 days and then he can resume back his 20 mg daily.  Advised... home bronchodilators periodically every 4-6 hours for next 4-5 days and then transition to as needed. Advised to follow-up with his PCP in about 1 week.   Discharge Diagnoses:  Active Problems:   Hypertension   COPD exacerbation (HCC)   Marijuana abuse   Pneumonia   Sepsis (HCC)  Acute respiratory distress secondary to right lower lobe pneumonia, community-acquired/aspiration Acute mild exacerbation of COPD Concern for possible aspiration.  Unasyn and azithromycin transition to Augmentin and azithromycin.  We will give him 5 more day course at the time of discharge 40 mg of prednisone for next 3 days then transition to 20 mg daily which is his home dose Advised to use bronchodilators.3  Alcohol use disorder Counseled to quit drinking alcohol.  Advised to continue thiamine, folate and multivitamin  Weakness -He did well with physical therapy.  Tobacco use Marijuana use Cessation discussed on admission  Consultations:  None  Subjective: Still having some cough and congestion but he is adamant about going home today as he is feeling much  better than from admission.  I recommended he stay in the hospital for at least 1 more day to help him feel even better.  He wished to go home. Ambulating in the hallway without additional saturating greater than 90% on room air.   Since discharge this patient went to urgent care on 21 September with an outbreak of shingles over his left lower back.  He is taken a course of valacyclovir and prednisone.  He has severe pain.  He was given gabapentin but this caused hallucinations and then we tried tramadol but this caused severe nausea and vomiting.  Apparently the patient cannot take Tylenol or nonsteroidal anti-inflammatories as well.  The patient states his breathing is been labored.  He still has productive cough of white mucus.  He has no fever.  He has significant back pain because of the shingles. The patient is maintaining the Dulera and Incruse as well as his antihypertensive medications and vitamins     10/12/2018  This patient is seen in return follow-up for acute shingles with postherpetic neuropathy, recent pneumonia, COPD exacerbation, and hypertension.  Patient is markedly improved from the last visit.  He has less cough less shortness of breath.  He has improved pain around the area of the shingles.  He is taking amitriptyline 25 mg at bedtime  The   patient notes less pain.  He does state he had been using the tramadol without nausea or vomiting.  I asked if patient would be interested in the para medicine program and expressed interest in the community para medicine program  11/23/2018 Patient seen in return follow-up and has noted increasing shortness of breath and we had to resume prednisone this patient.  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  He does maintain his inhalers.  He is drinking 4 beers a night.  He is not smoking.  Note the patient continues to live in a home that has standing water under the foundation and in several the rooms and mold on the carpet.  We have  connected this patient with legal aid to see if they can assist him in getting into better housing.  The patient's not had previous allergy testing   12/13/2018 The patient notes increased shortness of breath, wheezing and cough productive of thick yellow mucus.  He is using the flutter valve but is not being is productive with this.  He denies chest pain or fever.  He is down to 2 a day on the prednisone.  He is maintaining his inhalers. Apparently there is repair is being done on the patient's home and he is decided to stay in the home  02/27/2019 This patient is seen in return follow-up for COPD.  The patient has significant mold exposure in the home.  There has been some mitigation of this but is not complete.  The patient just was in the urgent care for COPD exacerbation a week ago treated with a course of antibiotics and prednisone.  He is now off prednisone. Patient states the pain in the back has resolved however he is still having increased shortness of breath.  He still having productive cough of thick yellow mucus.  05/01/2019  Since the last OV the patient has been admitted for Copd flare again. Below is the discharge summary Dc summary:  Admit Date: 04/23/2019 Discharge date: 04/27/2019  Recommendations for Outpatient Follow-up:  1. Follow up with PCP in 1-2 weeks 2. Please obtain CMP/CBC in one week 3. Please repeat chest x-ray in the next 6 to 8 weeks to document resolution of infiltrates, if not may require further work-up.     Brief Narrative: Patient is a77 y.o.malewith history of COPD, intellectual disability, HTN who presented with shortness of breath-found to have acute hypoxic respiratory failure secondary to pneumonia and COPD exacerbation. Patient initially required BiPAP on admission. See below for further details.  Significant events: 4/19>> admit to [facility] for severe hypoxemia requiring BiPAP 4/20>> liberated from BiPAP  Antimicrobial  therapy: Rocephin 4/18>>4/23 Zithromax 4/18>>4/22  Microbiology data: Blood culture: 4/19>> negative  Procedures : None  Consults: None  Brief Hospital Course: Acute hypoxic/hypercarbic respiratory failure secondary to COPD exacerbation and pneumonia:Improved-initially required BiPAP on admission-titrated off BiPAP on 4/20.Now on room air with stable O2 saturations.  Feels much better-treated with Rocephin and Zithromax-we will transition to [facility] for a few more days on discharge.  He will resume his usual inhaler regimen-he will be placed on 20 mg of p.o. prednisone until he is seen by [name].  PCP to repeat a chest x-ray in 6 to 8 weeks to document resolution of infiltrates-if not he may require further work-up to rule out underlying malignancy.  (Per patient's sister-patient probably has been taking more prednisone than he should be-he is unable to read-and is not able to follow directions)  HTN: BPstable-continue losartan  EtOH abuse:No signs  of withdrawal-managed with  Ativan per protocol. Per patient's sister-he drinks around 2-3 bottles of beer on a daily basis.  Intellectual disability:Spoke at length with patient's sister ([name]) today-patient isa illiterate-and has been living on his own for all his life. His other sister lives a mileaway and checks on him daily. Sister acknowledges poor living conditions-but apparently this is the way that patient has lived all his life-and chooses to live this way. Patient does odd jobs and sustainsa livelihood. Patient is currently managing all his finances. [name] does acknowledge that patient will not let any home health services come to his house-he will not go to SNF. Difficult situation-but even though patient is an illiterate/intellectual disability-he understand that he is somewhat deconditioned from his acute illness-and is in need of some help-he does not want therapy services-he understands the risk of falls from  being weak.  This MD had numerous discussions with him regarding his appropriate disposition-all he wants to do is go home-his sister-[name] wants to have him come to her place in [place], [place] for a few weeks-however the patient really does not want to go there-and wants to go home.  I have discussed at length with social worker as well-at this point he is being discharged home at his own request.  His family will keep an eye on him.   Nutrition Problem: Nutrition Problem: Increased nutrient needs Etiology: chronic illness(COPD) Signs/Symptoms: estimated needs Interventions: Ensure Enlive (each supplement provides 350kcal and 20 grams of protein)  Discharge Diagnoses:  Principal Problem:   Acute on chronic respiratory failure with hypoxia and hypercapnia (HCC) Active Problems:   Hypertension   COPD exacerbation (HCC)   Community acquired pneumonia of right lower lobe of lung   Marijuana abuse   Intellectual disability  Since discharge the patient has completed his antibiotics for his right lower lobe pneumonia.  He is on tapering prednisone.  He has developed a rash on his arms and face.  The rash is pruritic in nature.  He has been maintaining his medications as prescribed.   07/23/2019 Since the last visit this patient was readmitted to the hospital between 30 June and 3 July.  Below is a copy of the discharge summary Discharge Diagnosis: 1. Acute hypoxic respiratory 2/2 COPD exacerbation vs pneumonitis 2. GERD 3. EtOH history 4. HTN  Discharge Medications: Allergies as of 07/07/2019     Reactions  Gabapentin Other (See Comments)  hallucinations  Disposition and follow-up:   [name] was discharged from [facility] in Stable condition.  At the hospital follow up visit please address:  1.  COPD exacerbation: Continue daily Albuterol, Dulera, Spiriva, and prednisone 40 mg daily. Sent home with 1 additional day of azithromycin.  Discharged on 2L [place]. F/u allergy labs.  GERD: Severe reflux noted on esophagram, started on pantoprazole daily, may need GI follow up.   2.  Labs / imaging needed at time of follow-up: None  3.  Pending labs/ test needing follow-up: IgE, allergen panel for mold, hypersensitivity pneumonitis  Note since discharge the patient's breathing is back to baseline.  The patient does have some wheezing and minimal productive cough.  Noticed blood pressures ranged up at this visit.  He is maintaining Dulera Spiriva as before he has been maintained on prednisone 40 mg daily and also discharged on 2 L oxygen.  He no longer is actively working at this time.  Note his mold assay did show a mild hypersensitivity to some yeast.  08/23/2019   Patient returns today in follow-up showing increased shortness of breath.  He continues to work outdoors in the heat and this is exacerbating his condition despite being on 20 mg of prednisone daily.  He notes thick yellow mucus that he is coughing up.  He notes increased wheeze.  He has ran out of his Spiriva        10/02/2019 This patient returns after having been in the hospital again for 4 days with discharge summary as noted below. Patient returns today with continued shortness of breath and productive cough. Mucus is still thick and yellow and difficult to raise. He has chest tightness. He still drinking 6-7 beers daily. He is no longer smoking. He is now on oxygen 4 L continuous. He has had his Medicaid approved and is now fully declared disabled. 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 The landlord has a complex relationship with this patient. Patient lives rent free and a home on the landlord's property. The home is not in good repair as documented previously.   Date of Admission: 09/22/2019  3:42 AM Date of Discharge: 09/25/2019 Attending Physician: Lenice Pressman, MD PhD  Discharge Diagnosis: 1. COPD exacerbation, underlying  bronchiectasis 2. Alcohol use disorder 3. Skin rash    Disposition and follow-up:   [name] was discharged from [facility] in Stable condition.  At the hospital follow up visit please address:  1.  Follow up      A. COPD exacerbation, recurrent pneumonias, underlying bronchiectasis - hx of recurrent pneumonia, see hospital course for details on prior workup, follow up with his pulmonologist      B. Alcohol use disorder - had mild withdrawal symptoms, motivated to quit, consider pharmacologic assistance such as naltrexone      C. Skin rash - not improving with triamcinolone, referred to dermatology  2.  Labs / imaging needed at time of follow-up: skin biopsy  3.  Pending labs/ test needing follow-up: none  Follow-up Appointments:  Hospital Course by problem list:  COPDexacerbation Recurrent pneumonia Bronchiectasis on bronchoscopy Presents with increased SOB, but cough baseline and not productive. He has had similar symptoms many times previously. PFTs in April 2021 with FEV1/FVC which was 54.01% of expected. CXR this admission with vague R lower lung field opacity, same location as opacity on prior CXRs. Has had recurrent pneumonia as far back as 2015. bronchoscopy 08/10/2018 for recurrent PNU demonstrating severe chronic bronchitis and bronchiectasis,neutrophil predominance and transbronchial biopsies with benign lung tissue with inflammation. CT on 07/05/19 with no ILD, no centrally obstructing lesion. Esophagram on 07/06/19 with no stenosis but severe reflux which could be contributing to COPD. Other pulm workup significant for IgE WNL, allergen profile for mold equivocal for Candida albicans but negative for all others, negative hypersensitivity pneumonitis panel. Has very good outpatient follow up with pulmonology. Is on Dulera, Spiriva, and albuterol and also prednisone 47m daily, states he has been compliant with all these, though at one point earlier  this admission denied steroid use. Treated with ceftriaxone, azithromycin, and prednisone initially with rapid improvement in SOB. No fevers this admission, cough remains unproductive so did not culture. Discharged on his home Dulera, Spiriva, albuterol, Prednisone. Discharged with oral azithromycin to complete 5 day course.   Alcohol Use Disorder Reports using 2x 24oz beers per day, which is decreased from prior. No hx of withdrawal seizures. CIWA scores max of 15 indicating mild withdrawal, requiring relatively low  amounts of Ativan. On day of discharge had scores of up to 5. Patient is motivated to quit drinking because "I want to live longer." Discharged with PO thiamine.  Skin rash Reports a rash for the past several months on his abdomen and both arms. His PCP has been treating this with triamcinolone, but this has not really helped. Referral provided to dermatology for biopsy/further assessment.  Note the patient's rash on both forearms is still persistent and triamcinolone has not helped  This patient will need a dermatology referral as per discharge summary. Note the patient is still actively drinking alcohol. See shortness of breath assessment below  11/06/2019 This patient is seen by way of a telephone visit and post hospital.  He was just admitted again for recurrent pneumonia this time with Covid infection.  This patient was intubated was severely ill.  He is now on oxygen 4 L continuous and 40 mg daily of prednisone.  He also has a allergic dermatitis rash.  This has not been responsive to steroids and is quite pruritic despite hydroxyzine when seen recently at urgent care.  I went over with this patient at this visit the need to receive assisted care but he refuses and wishes to stay in his current living arrangement which has very poor environment  Below is a copy of the recent discharge summary  Admission date:  10/19/2019  Admitting Physician  No admitting provider for  patient encounter.  Discharge Date:  10/26/2019   Primary MD  Sakia Schrimpf E, MD  Recommendations for primary care physician for things to follow:  -Patient instructed to follow with PCP/pulmonary as an outpatient, appointment will be rescheduled till he is off quarantine as discussed with the patient and staff. -Please check CBC, CMP during next visit.  Admission Diagnosis  COPD exacerbation (HCC) (J44.1) Acute on chronic respiratory failure with hypoxia (HCC) (J96.21) Pneumonia due to COVID-19 virus (U07.1, J12.82)   Discharge Diagnosis  COPD exacerbation (HCC) (J44.1) Acute on chronic respiratory failure with hypoxia (HCC) (J96.21) Pneumonia due to COVID-19 virus (U07.1, J12.82)    Active Problems:   Acute on chronic respiratory failure with hypoxia (HCC)   Pneumonia due to COVID-19 virus      Past Medical History: Diagnosis Date . Acute on chronic respiratory failure with hypoxia (HCC) 06/08/2013 . CAP (community acquired pneumonia) 05/17/2018  See admit 05/17/18 ? rml  with   covid pcr neg - rx augmentin > f/u cxr in 4-6 weeks is fine unless condition declines  . Community acquired pneumonia of right lower lobe of lung 05/17/2018  See admit 05/17/18 ? rml  with   covid pcr neg - rx augmentin > f/u cxr in 4-6 weeks is fine unless condition declines  . COPD (chronic obstructive pulmonary disease) (HCC)   not on home . Diabetes (HCC)  . Dyspnea  . Hypertension  . Pneumonia 04/06/2016 . Pneumothorax   2016, fell from ladder . 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 Tick bite 06/03/2018   Past Surgical History: Procedure Laterality Date . VIDEO ASSISTED THORACOSCOPY (VATS)/THOROCOTOMY Left 06/11/2013  Procedure: VIDEO ASSISTED THORACOSCOPY (VATS)/THOROCOTOMY;  Surgeon: Peter Van Trigt, MD;  Location: [facility] OR;  Service: Thoracic;  Laterality: Left; . VIDEO BRONCHOSCOPY N/A 06/11/2013  Procedure: VIDEO BRONCHOSCOPY;  Surgeon: Peter Van Trigt, MD;  Location: [facility]  OR;  Service: Thoracic;  Laterality: N/A; . VIDEO BRONCHOSCOPY N/A 08/10/2018  Procedure: VIDEO BRONCHOSCOPY WITH FLUORO;  Surgeon: Smith, Daniel C, MD;  Location: [facility] ENDOSCOPY;  Service: Endoscopy;  Laterality: N/A;        History of present illness and  Hospital Course:     Kindly see H&P for history of present illness and admission details, please review complete Labs, Consult reports and Test reports for all details in brief  HPI  from the history and physical done on the day of admission by PCCM on 10/19/2019  Pt is encephelopathic; therefore, this HPI is obtained from chart review. James Robertsonis a 59 y.o.malewho has a PMH as outlined below.[facility]resented to [facility] 10/15 with significant dyspnea to the point that he could not speak in full sentences. He was placed on CPAP but did not respond; therefore, he was intubated.  After intubation, COVID returned positive.  He has been evaluated by pulmonary on prior admissions given recurrent PNA. He has bronchoscopy in Aug 2020 which showed severe bronchiectasis bilaterally, neutrophil predominance and transbronchial biopsies were benign.   Hospital Course   James Robertsonis a 59 y.o.malewho has a PMH of chronic respiratory failure, COPD, diabetes, hypertension, presented to [facility] ED 10/15 with significant dyspnea to the point that he could not speak in full sentences. He was placed on CPAP but did not respond; therefore, he was intubated.After intubation, COVID returned positive. He has been evaluated by pulmonary on prior admissions given recurrent PNA. He has bronchoscopy in Aug 2020 which showed severe bronchiectasis bilaterally, neutrophil predominance and transbronchial biopsies were benign. Patient was successfully extubated 10/17, and he was transferred [facility].   Acute on chronic hypoxic Resp. Failure due to Acute Covid 19 Viral Pneumonitis during the ongoing 2020 Covid 19 Pandemic   -Unvaccinated, he is a critically ill on presentation required intubation.  He was successfully extubated after 48 hours, with increased oxygen requirement initially, he is currently back at his baseline oxygen requirement, report requiring 2 L at rest, 4 L with activity. -He is extubated 10/17. -Treated initially with IV steroids, he will be discharged on home dose prednisone 20 mg oral daily (this was confirmed with with the patient that he is at current dose, he was given a refill for next 30 days till he is seen by PCP) -He was treated with remdesivir during hospital stay, as well he was treated with baricitinib as well -Was encouraged to take his incentive spirometry and flutter valve at home and continue using them .   COPD exacerbation -Treated with antibiotics for 5 days, continue with bronchodilators, continue with steroid  .  Hypertension -Continue with home dose metoprolol, and Cozaar  Diabetes mellitus -We will hold home dose Metformin, will keep on sliding scale during hospital stay, A1c is 6.8  History of alcohol abuse -No evidence of withdrawal during hospital stay, continue with thiamine and folic acid on discharge  Discharge Condition:  stable   Patient states his breathing is back at baseline he states he is no longer drinking alcohol he states his blood sugars have been in the 100 range he complains of his left hand feeling numb and is not able to sleep at night due to agitation   11/13/19 This patient was seen in return follow-up after had been seen by way of a telephone visit a week ago.  He was just hospitalized note discharge summary is as above.  He had a Covid pneumonia and respiratory failure was on mechanical ventilator.  Today he comes in on 4 L oxygen nighttime only been on room air today is 95%.  He has advanced COPD.  He had significant alcohol abuse he states he is no longer   drinking alcohol.  He still complains of bilateral rashes on the arms and  the umbilical area.  See shortness of breath assessment below  11/19/2019 This patient is seen again in short-term follow-up after having just been seen last week.  He came to the emergency room on 12 November in acute distress.  He was given high-dose IV steroids intensive neb treatments and discharged.  The patient states since taking the Diflucan and triamcinolone cream his rash is improved on his arms.  He states his breathing is getting back to baseline.  He still using oxygen 4 L at bedtime.  He still has thick tenacious mucus with thick yellow mucus.  He is off all antibiotics.  He is on prednisone 40 mg daily.  The patient continues to adamantly refuse leaving his current housing.  He is not regularly checking his blood sugar at home.  See assessment below  12/03/2019 Short term f/u COPD   No smoking since 2014.  Still in current housing. No ETOH use  The patient today states his breathing is somewhat improved he has no cough he is off oxygen.  He does complain of right-sided wrist pain he cannot recall if he injured the wrist.  This is been going on for 1 week.  Patient states he has some pruritus but his rash is overall improved.  He is no longer drinking alcohol.  He is not smoking.  He is yet to receive a Covid vaccine.  He is due a flu vaccine at this visit.     Shortness of Breath This is a chronic problem. The current episode started more than 1 year ago. The problem occurs daily (qhs shortness of breath). The problem has been gradually improving. Pertinent negatives include no chest pain, claudication, fever, headaches, hemoptysis, leg pain, leg swelling, orthopnea, PND, rash, sore throat, sputum production, syncope, vomiting or wheezing. The symptoms are aggravated by any activity, lying flat and occupational exposure. Associated symptoms comments: gerd  . Risk factors include smoking. He has tried beta agonist inhalers, oral steroids and steroid inhalers for the symptoms.  The treatment provided moderate relief. His past medical history is significant for COPD and pneumonia. There is no history of asthma, CAD, DVT, a heart failure or PE.    Past Medical History:  Diagnosis Date  . Acute on chronic respiratory failure with hypoxia ([place]) 06/08/2013  . CAP (community acquired pneumonia) 05/17/2018   See admit 05/17/18 ? rml  with   covid pcr neg - rx augmentin > f/u cxr in 4-6 weeks is fine unless condition declines   . Community acquired pneumonia of right lower lobe of lung 05/17/2018   See admit 05/17/18 ? rml  with   covid pcr neg - rx augmentin > f/u cxr in 4-6 weeks is fine unless condition declines   . COPD (chronic obstructive pulmonary disease) ([place])    not on home  . Diabetes ([place])   . Dyspnea   . Hypertension   . Pneumonia 04/06/2016  . Pneumonia due to COVID-19 virus 10/19/2019  . Pneumothorax    2016, fell from ladder  . 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 Tick bite 06/03/2018     Family History  Problem Relation Age of Onset  . Heart disease Father   . COPD Sister      Social History   Socioeconomic History  . Marital status: Divorced    Spouse name: Not on file  . Number of children: Not on file  . Years of  education: Not on file  . Highest education level: Not on file  Occupational History  . Occupation: Curator  . Occupation: Painter  Tobacco Use  . Smoking status: Former Smoker    Packs/day: 1.50    Years: 40.00    Pack years: 60.00    Types: Cigarettes    Quit date: 2014    Years since quitting: 7.9  . Smokeless tobacco: Current User    Types: Chew  Vaping Use  . Vaping Use: Never used  Substance and Sexual Activity  . Alcohol use: Yes    Alcohol/week: 28.0 standard drinks    Types: 28 Cans of beer per week    Comment: 4 beers a night; + jitters with not drinking, denies DTs or seizures  . Drug use: Yes    Types: Marijuana    Comment: daily; quit using crack and methamphetamine about 5 months ago   . Sexual  activity: Not Currently    Partners: Female  Other Topics Concern  . Not on file  Social History Narrative   Lives alone near [place]   Social Determinants of Health   Financial Resource Strain:   . Difficulty of Paying Living Expenses: Not on file  Food Insecurity:   . Worried About Programme researcher, broadcasting/film/video in the Last Year: Not on file  . Ran Out of Food in the Last Year: Not on file  Transportation Needs:   . Lack of Transportation (Medical): Not on file  . Lack of Transportation (Non-Medical): Not on file  Physical Activity:   . Days of Exercise per Week: Not on file  . Minutes of Exercise per Session: Not on file  Stress:   . Feeling of Stress : Not on file  Social Connections:   . Frequency of Communication with Friends and Family: Not on file  . Frequency of Social Gatherings with Friends and Family: Not on file  . Attends Religious Services: Not on file  . Active Member of Clubs or Organizations: Not on file  . Attends Banker Meetings: Not on file  . Marital Status: Not on file  Intimate Partner Violence:   . Fear of Current or Ex-Partner: Not on file  . Emotionally Abused: Not on file  . Physically Abused: Not on file  . Sexually Abused: Not on file     Allergies  Allergen Reactions  . Gabapentin Other (See Comments)    hallucinations     Outpatient Medications Prior to Visit  Medication Sig Dispense Refill  . Accu-Chek Softclix Lancets lancets Use as instructed to check blood sugar once daily. (Patient taking differently: 1 each by Other route See admin instructions. Use as instructed to check blood sugar once daily.) 100 each 2  . albuterol (PROVENTIL) (2.5 MG/3ML) 0.083% nebulizer solution Take 3 mLs (2.5 mg total) by nebulization in the morning, at noon, and at bedtime. 270 mL 1  . albuterol (VENTOLIN HFA) 108 (90 Base) MCG/ACT inhaler Inhale 2 puffs into the lungs every 4 (four) hours as needed for wheezing or shortness of breath. 1 each 0  .  Blood Glucose Monitoring Suppl (ACCU-CHEK GUIDE ME) w/Device KIT 1 each by Other route in the morning and at bedtime.     . Blood Glucose Monitoring Suppl (TRUE METRIX METER) w/Device KIT Use to measure blood sugar twice a day (Patient taking differently: 1 each by Other route See admin instructions. Use to measure blood sugar twice a day) 1 kit 0  . budesonide-formoterol (SYMBICORT) 160-4.5  MCG/ACT inhaler Inhale 2 puffs into the lungs 2 (two) times daily. 1 each 12  . folic acid (FOLVITE) 1 MG tablet Take 1 tablet (1 mg total) by mouth daily. 90 tablet 0  . glucose blood (TRUE METRIX BLOOD GLUCOSE TEST) test strip Use as instructed (Patient taking differently: 1 each by Other route as directed. Use as instructed) 100 each 12  . losartan (COZAAR) 100 MG tablet Take 1 tablet (100 mg total) by mouth daily. 30 tablet 2  . metoprolol succinate (TOPROL-XL) 25 MG 24 hr tablet Take 1 tablet (25 mg total) by mouth daily. 90 tablet 3  . pantoprazole (PROTONIX) 40 MG tablet Take 1 tablet (40 mg total) by mouth daily. 30 tablet 6  . predniSONE (DELTASONE) 20 MG tablet Take 2 tablet daily for 7 days then 1 daily and STAY (Patient taking differently: 20 mg daily with breakfast. ) 60 tablet 1  . Respiratory Therapy Supplies (FLUTTER) DEVI Use 4 times daily (Patient taking differently: 1 each by Other route See admin instructions. Use 4 times daily) 1 each 0  . thiamine (VITAMIN B-1) 100 MG tablet Take 1 tablet (100 mg total) by mouth daily. 30 tablet 3  . Tiotropium Bromide Monohydrate (SPIRIVA RESPIMAT) 2.5 MCG/ACT AERS Two puff daily (Patient taking differently: Inhale 2 puffs into the lungs daily. ) 4 g 11  . triamcinolone cream (KENALOG) 0.1 % Apply 1 application topically 2 (two) times daily. 30 g 0  . metFORMIN (GLUCOPHAGE) 500 MG tablet Take 1 tablet (500 mg total) by mouth 2 (two) times daily with a meal. 120 tablet 3  . furosemide (LASIX) 20 MG tablet Take 1 tablet (20 mg total) by mouth daily as needed  for edema (lower extremity). (Patient not taking: Reported on 12/03/2019) 30 tablet 3  . hydrOXYzine (ATARAX/VISTARIL) 25 MG tablet Take 1 tablet (25 mg total) by mouth every 8 (eight) hours as needed for anxiety or itching. (Patient not taking: Reported on 11/27/2019) 30 tablet 0  . multivitamin-iron-minerals-folic acid (CENTRUM) chewable tablet Chew 1 tablet by mouth daily. (Patient not taking: Reported on 11/27/2019) 90 tablet 2   No facility-administered medications prior to visit.     Review of Systems  Constitutional: Negative for fever.  HENT: Negative for sinus pressure, sinus pain, sore throat and trouble swallowing.   Eyes: Negative.   Respiratory: Positive for cough and shortness of breath. Negative for hemoptysis, sputum production, chest tightness and wheezing.   Cardiovascular: Negative for chest pain, palpitations, orthopnea, claudication, leg swelling, syncope and PND.  Gastrointestinal: Negative for nausea and vomiting.  Genitourinary: Negative.   Musculoskeletal: Positive for back pain.  Skin: Negative for rash.  Neurological: Negative for headaches.  Psychiatric/Behavioral: Negative for hallucinations. The patient is not nervous/anxious and is not hyperactive.        Objective:   Physical Exam  Vitals:   12/03/19 0928  BP: (!) 143/87  Pulse: 89  Resp: (!) 24  Temp: 98.7 F (37.1 C)  SpO2: 97%  Weight: 192 lb (87.1 kg)   Gen: Pleasant, well-nourished, in no distress,  normal affect  ENT: No lesions,  mouth clear,  oropharynx clear, no postnasal drip, edentulous  Neck: No JVD, no TMG, no carotid bruits  Lungs: No use of accessory muscles, no dullness to percussion, distant breath sounds without wheezes  Cardiovascular: RRR, heart sounds normal, no murmur or gallops, no peripheral edema  Abdomen: soft and NT, no HSM,  BS normal  Musculoskeletal: Edema and tenderness in the lateral  aspect of the right forearm with slight defect, no cyanosis or  clubbing  Neuro: alert, non focal  Skin: Warm, no lesions , erythematous rash both forearms and umbilical area is improved   Disability eval at [facility]:   Pulmonary Function Test (04/19/2019 1:42 PM EDT) Pulmonary Function Test (04/19/2019 1:42 PM EDT)  Component Value Ref Range Performed At Pathologist Signature  FVC Post 2.51 L [facility]   FEV1 Post 1.55 L [facility]   FEV1/FVC Post 61.87 % [facility]   FEF25-75% Post 0.90 L/s [facility]   PEF Post 3.42 L/s [facility]   MVV Post 42.00 L/min [facility]   FVC Pre 2.92 L [facility]   FEV1 Pre 1.57 L [facility]   FEV1/FVC Pre 54.01 % [facility]   FEF25-75% Pre 0.68 L/s [facility]   PEF Pre 4.14 L/s [facility]   MVV Pre 45.00 L/min [facility]   FVC_LLN 3.73  [facility]   FVC_Z-SCORE -2.84  [facility]   FVC_%PRED 60 % % [facility]   FVC_Z-SCORE -2.84  [facility]   FVC_%PRED 52 % % [facility]   FVC %Chng -14 % % [facility]   FEV1_LLN 2.86  [facility]   FEV1_Z-SCORE -3.78  [facility]   FEV1_%PRED 42 % % [facility]   FEV1_Z-SCORE -3.78  [facility]   FEV1_%PRED 41 % % [facility]   FEV1 %Chng -2 % % [facility]   FEV1/FVC_LLN 66  [facility]   FEV1/FVC %Chng 15 % % [facility]   FEF25-75%_LLN 1.60  [facility]   FEF25-75%_Z-SCORE -3.05  [facility]   FEF25-75%_%PRED 21 % % [facility]   FEF25-75%_Z-SCORE -3.05  [facility]   FEF25-75%_%PRED 28 % % [facility]   FEF25-75% %Chng 33 % % [facility]   PEF_LLN 7.25  [facility]   PEF_%PRED 43 % 36 % % [facility]   PEF %Chng -17 % % [facility]   MVV_LLN 130  [facility]   MVV_%PRED 35 % 32 % % [facility]   MVV %Chng -7 % % [facility]     Media Information    Document Information  Photos    11/02/2018 14:43  Attached To:  [name]  Source Information  [name], MD  [facility]   Pertinent Labs, Studies, and Procedures:  DG Chest Portable 1 View  Result Date: 09/22/2019 CLINICAL DATA:  Dyspnea. COPD exacerbation. Chest pain and shortness of breath. EXAM: PORTABLE CHEST 1 VIEW COMPARISON:  Cyst 07/05/2019 FINDINGS: Stable cardiomediastinal contours. Chronic blunting of the left costophrenic angle due to pleuroparenchymal scarring. Changes of emphysema noted with asymmetric increase hazy opacity within the right lung base concerning for pneumonia. Numerous remote bilateral rib fracture deformities. IMPRESSION: 1. Increased hazy opacity within the right lung base concerning for pneumonia. 2. Emphysema. Electronically Signed   By: Kerby Moors M.D.   On: 09/22/2019 04:20     BMP Latest Ref Rng & Units 11/16/2019 10/24/2019 10/23/2019  Glucose 70 - 99 mg/dL 113(H) 130(H) 139(H)  BUN 6 - 20 mg/dL 15 31(H) 34(H)  Creatinine 0.61 - 1.24 mg/dL 0.98 0.73 0.66  BUN/Creat Ratio 9 - 20 - - -  Sodium 135 - 145 mmol/L 144 139 138  Potassium 3.5 - 5.1 mmol/L 4.0 4.6 4.6  Chloride 98 - 111 mmol/L 103 99 99  CO2 22 - 32 mmol/L $RemoveB'27 31 30  'GDnRhcKR$ Calcium 8.9 - 10.3 mg/dL 9.4 9.4 9.2   Hepatic Function Panel     Component Value Date/Time   PROT 5.0 (L) 10/20/2019 0050   PROT 5.9 (L) 07/23/2019 0900   ALBUMIN 2.8 (L) 10/20/2019 0050   ALBUMIN 4.0 07/23/2019 0900   AST 18 10/20/2019 0050   ALT 22 10/20/2019 0050   ALT 62 (H) 06/23/2016  1226   ALKPHOS 36 (L) 10/20/2019 0050   BILITOT 0.7 10/20/2019 0050   BILITOT <0.2 07/23/2019 0900   BILIDIR <0.1 10/20/2019 0050   BILIDIR 0.12 04/16/2016 1601   IBILI NOT CALCULATED 10/20/2019 0050   CBC Latest Ref Rng & Units 11/16/2019 10/24/2019 10/21/2019  WBC 4.0 - 10.5 K/uL 10.4 17.3(H) 23.8(H)  Hemoglobin 13.0 - 17.0 g/dL 13.4 13.9 13.0  Hematocrit 39 - 52 % 42.1 43.0 40.8  Platelets 150 - 400 K/uL 301 300 286       Assessment & Plan:  I personally reviewed all images and lab data in the [facility] system as well as any outside material available during this office visit and agree with the  radiology impressions.   Hypertension Hypertension with reasonable control no change in medications for now  Chronic respiratory failure with hypoxia (HCC) Continue oxygen at night no need for oxygen during the daytime  COPD mixed type (HCC) Taper prednisone down to 20 mg a day slowly and hold at that level  Continue inhaled medications as prescribed  Type 2 diabetes mellitus without complication, without long-term current use of insulin (HCC) Continue Metformin twice daily 1000 mg dose now and increase from the 500 mg twice daily  Other atopic dermatitis Continue triamcinolone topically  Pain of right forearm Rule out fracture obtain x-rays of the right wrist and forearm  Bronchiectasis ([place]) Stressed importance of follow-up with pulmonary medicine because of bilateral persisting infiltrates status post Covid   Brick was seen today for follow-up.  Diagnoses and all orders for this visit:  Bronchiectasis without complication ([place])  Type 2 diabetes mellitus without complication, without long-term current use of insulin (HCC) -     Glucose (CBG)  Pain of right forearm -     DG Wrist Complete Right; Future -     DG Forearm Right; Future -     For home use only DME Other see comment  Need for immunization against influenza -     Flu Vaccine QUAD 36+ mos IM  COPD mixed type  ([place])  Primary hypertension  Chronic respiratory failure with hypoxia (HCC)  Other atopic dermatitis  Other orders -     traMADol (ULTRAM) 50 MG tablet; Take 1 tablet (50 mg total) by mouth every 8 (eight) hours as needed for up to 5 days. -     metFORMIN (GLUCOPHAGE) 500 MG tablet; Take 2 tablets (1,000 mg total) by mouth 2 (two) times daily with a meal.    Flu vaccine was given

## 2019-12-03 ENCOUNTER — Other Ambulatory Visit: Payer: Self-pay

## 2019-12-03 ENCOUNTER — Ambulatory Visit: Payer: MEDICAID | Attending: Critical Care Medicine | Admitting: Critical Care Medicine

## 2019-12-03 ENCOUNTER — Ambulatory Visit
Admission: RE | Admit: 2019-12-03 | Discharge: 2019-12-03 | Disposition: A | Discharge status: Home / Self Care | Payer: Medicaid Other | Source: Ambulatory Visit | Attending: Critical Care Medicine | Admitting: Critical Care Medicine

## 2019-12-03 ENCOUNTER — Encounter: Payer: Self-pay | Admitting: Critical Care Medicine

## 2019-12-03 ENCOUNTER — Other Ambulatory Visit: Payer: Self-pay | Admitting: Critical Care Medicine

## 2019-12-03 VITALS — BP 143/87 | HR 89 | Temp 98.7°F | Resp 24 | Wt 192.0 lb

## 2019-12-03 DIAGNOSIS — M19041 Primary osteoarthritis, right hand: Secondary | ICD-10-CM | POA: Diagnosis not present

## 2019-12-03 DIAGNOSIS — Z23 Encounter for immunization: Secondary | ICD-10-CM | POA: Diagnosis not present

## 2019-12-03 DIAGNOSIS — E119 Type 2 diabetes mellitus without complications: Secondary | ICD-10-CM

## 2019-12-03 DIAGNOSIS — J9611 Chronic respiratory failure with hypoxia: Secondary | ICD-10-CM | POA: Diagnosis not present

## 2019-12-03 DIAGNOSIS — M19031 Primary osteoarthritis, right wrist: Secondary | ICD-10-CM | POA: Diagnosis not present

## 2019-12-03 DIAGNOSIS — J479 Bronchiectasis, uncomplicated: Secondary | ICD-10-CM | POA: Diagnosis not present

## 2019-12-03 DIAGNOSIS — M79631 Pain in right forearm: Secondary | ICD-10-CM

## 2019-12-03 DIAGNOSIS — L2089 Other atopic dermatitis: Secondary | ICD-10-CM

## 2019-12-03 DIAGNOSIS — J449 Chronic obstructive pulmonary disease, unspecified: Secondary | ICD-10-CM

## 2019-12-03 DIAGNOSIS — I10 Essential (primary) hypertension: Secondary | ICD-10-CM | POA: Diagnosis not present

## 2019-12-03 LAB — GLUCOSE, POCT (MANUAL RESULT ENTRY): POC Glucose: 169 mg/dl — AB (ref 70–99)

## 2019-12-03 MED ORDER — TRAMADOL HCL 50 MG PO TABS
50.0000 mg | ORAL_TABLET | Freq: Three times a day (TID) | ORAL | 0 refills | Status: DC | PRN
Start: 2019-12-03 — End: 2019-12-03

## 2019-12-03 MED ORDER — METFORMIN HCL 500 MG PO TABS: 1000.0000 mg | ORAL_TABLET | Freq: Two times a day (BID) | ORAL | 0 refills | Status: DC

## 2019-12-03 MED FILL — traMADol HCL 50 MG TABS: 50 | 5 days supply | Qty: 15 | Fill #0

## 2019-12-03 MED FILL — METFORMIN HCL 500 MG TABS: 500 | 60 days supply | Qty: 240 | Fill #0

## 2019-12-03 NOTE — Assessment & Plan Note (Signed)
Continue Metformin twice daily 1000 mg dose now and increase from the 500 mg twice daily

## 2019-12-03 NOTE — Assessment & Plan Note (Signed)
Continue oxygen at night no need for oxygen during the daytime

## 2019-12-03 NOTE — Assessment & Plan Note (Signed)
Hypertension with reasonable control no change in medications for now

## 2019-12-03 NOTE — Assessment & Plan Note (Signed)
Stressed importance of follow-up with pulmonary medicine because of bilateral persisting infiltrates status post Covid

## 2019-12-03 NOTE — Assessment & Plan Note (Signed)
Rule out fracture obtain x-rays of the right wrist and forearm

## 2019-12-03 NOTE — Progress Notes (Signed)
F/u with lungs Concerns with Lasix, possible RF  C/o right wrist pain

## 2019-12-03 NOTE — Patient Instructions (Signed)
Begin tramadol one every 6 hours as needed for pain   X-rays of the right wrist and forearm will be obtained  A splint prescription was given to you please obtain this from her pharmacy for a splint for your right arm  No change in your other medications for now  A flu vaccine was given at this visit  Increase Metformin to 1000 mg twice daily refills were sent  Please obtain your Covid vaccine  Return to see [name] 2 weeks

## 2019-12-03 NOTE — Assessment & Plan Note (Signed)
Continue triamcinolone topically

## 2019-12-03 NOTE — Assessment & Plan Note (Signed)
Taper prednisone down to 20 mg a day slowly and hold at that level  Continue inhaled medications as prescribed

## 2019-12-07 DIAGNOSIS — J9621 Acute and chronic respiratory failure with hypoxia: Secondary | ICD-10-CM | POA: Diagnosis not present

## 2019-12-07 DIAGNOSIS — F79 Unspecified intellectual disabilities: Secondary | ICD-10-CM | POA: Diagnosis not present

## 2019-12-07 DIAGNOSIS — J9601 Acute respiratory failure with hypoxia: Secondary | ICD-10-CM | POA: Diagnosis not present

## 2019-12-12 ENCOUNTER — Other Ambulatory Visit (HOSPITAL_COMMUNITY): Payer: Self-pay

## 2019-12-12 MED FILL — PANTOPRAZOLE SOD DR 40 MG T: 40 | 90 days supply | Qty: 90 | Fill #1

## 2019-12-12 MED FILL — predniSONE 20 MG TABS: 20 | 30 days supply | Qty: 37 | Fill #1

## 2019-12-12 NOTE — Progress Notes (Signed)
Came out today at our appointment time, he was not home. I waited for approx 39min so I left. He called me when I got back to [place] but due to time restraint I could not go back, I told him we would have to resch for next week.  He said he needed refills but he told his sister teresa and she was going to take care of it.   [name], EMT-Paramedic  12/12/19

## 2019-12-20 ENCOUNTER — Other Ambulatory Visit (HOSPITAL_COMMUNITY): Payer: Self-pay

## 2019-12-20 NOTE — Progress Notes (Signed)
Paramedicine Encounter    Patient ID: James [name], male    DOB: April 13, 1960, 59 y.o.   MRN: [id number]   Patient Care Team: [name], MD as PCP - General (Pulmonary Disease) O'Neal, Cassie Freer, MD as PCP - Cardiology (Cardiology) [name], RN as Case Manager [name], [facility] as Pharmacist (Pharmacist)  Patient Active Problem List   Diagnosis Date Noted  . Pain of right forearm 12/03/2019  . Bronchiectasis ([name]) 09/24/2019  . Type 2 diabetes mellitus without complication, without long-term current use of insulin ([place]) 07/24/2019  . Other atopic dermatitis 05/01/2019  . Chronic respiratory failure with hypoxia ([place]) 04/23/2019  . Intellectual disability 04/23/2019  . History of alcohol use 11/23/2018  . Hypertension 05/17/2016  . History of tobacco abuse 04/06/2016  . COPD mixed type ([place]) 03/26/2016    Current Outpatient Medications:  .  Accu-Chek Softclix Lancets lancets, Use as instructed to check blood sugar once daily. (Patient taking differently: 1 each by Other route See admin instructions. Use as instructed to check blood sugar once daily.), Disp: 100 each, Rfl: 2 .  albuterol (PROVENTIL) (2.5 MG/3ML) 0.083% nebulizer solution, Take 3 mLs (2.5 mg total) by nebulization in the morning, at noon, and at bedtime., Disp: 270 mL, Rfl: 1 .  albuterol (VENTOLIN HFA) 108 (90 Base) MCG/ACT inhaler, Inhale 2 puffs into the lungs every 4 (four) hours as needed for wheezing or shortness of breath., Disp: 1 each, Rfl: 0 .  Blood Glucose Monitoring Suppl (ACCU-CHEK GUIDE ME) w/Device KIT, 1 each by Other route in the morning and at bedtime. , Disp: , Rfl:  .  budesonide-formoterol (SYMBICORT) 160-4.5 MCG/ACT inhaler, Inhale 2 puffs into the lungs 2 (two) times daily., Disp: 1 each, Rfl: 12 .  folic acid (FOLVITE) 1 MG tablet, Take 1 tablet (1 mg total) by mouth daily., Disp: 90 tablet, Rfl: 0 .  losartan (COZAAR) 100 MG tablet, Take 1 tablet (100 mg total) by mouth  daily., Disp: 30 tablet, Rfl: 2 .  metFORMIN (GLUCOPHAGE) 500 MG tablet, Take 2 tablets (1,000 mg total) by mouth 2 (two) times daily with a meal., Disp: 240 tablet, Rfl: 0 .  metoprolol succinate (TOPROL-XL) 25 MG 24 hr tablet, Take 1 tablet (25 mg total) by mouth daily., Disp: 90 tablet, Rfl: 3 .  pantoprazole (PROTONIX) 40 MG tablet, Take 1 tablet (40 mg total) by mouth daily., Disp: 30 tablet, Rfl: 6 .  predniSONE (DELTASONE) 20 MG tablet, Take 2 tablet daily for 7 days then 1 daily and STAY (Patient taking differently: 20 mg daily with breakfast.), Disp: 60 tablet, Rfl: 1 .  Respiratory Therapy Supplies (FLUTTER) DEVI, Use 4 times daily (Patient taking differently: 1 each by Other route See admin instructions. Use 4 times daily), Disp: 1 each, Rfl: 0 .  thiamine (VITAMIN B-1) 100 MG tablet, Take 1 tablet (100 mg total) by mouth daily., Disp: 30 tablet, Rfl: 3 .  Tiotropium Bromide Monohydrate (SPIRIVA RESPIMAT) 2.5 MCG/ACT AERS, Two puff daily (Patient taking differently: Inhale 2 puffs into the lungs daily.), Disp: 4 g, Rfl: 11 .  Blood Glucose Monitoring Suppl (TRUE METRIX METER) w/Device KIT, Use to measure blood sugar twice a day (Patient taking differently: 1 each by Other route See admin instructions. Use to measure blood sugar twice a day), Disp: 1 kit, Rfl: 0 .  furosemide (LASIX) 20 MG tablet, Take 1 tablet (20 mg total) by mouth daily as needed for edema (lower extremity). (Patient not taking: No  sig reported), Disp: 30 tablet, Rfl: 3 .  glucose blood (TRUE METRIX BLOOD GLUCOSE TEST) test strip, Use as instructed (Patient not taking: Reported on 12/20/2019), Disp: 100 each, Rfl: 12 .  hydrOXYzine (ATARAX/VISTARIL) 25 MG tablet, Take 1 tablet (25 mg total) by mouth every 8 (eight) hours as needed for anxiety or itching. (Patient not taking: No sig reported), Disp: 30 tablet, Rfl: 0 .  multivitamin-iron-minerals-folic acid (CENTRUM) chewable tablet, Chew 1 tablet by mouth daily. (Patient not  taking: No sig reported), Disp: 90 tablet, Rfl: 2 .  triamcinolone cream (KENALOG) 0.1 %, Apply 1 application topically 2 (two) times daily. (Patient not taking: Reported on 12/20/2019), Disp: 30 g, Rfl: 0 Allergies  Allergen Reactions  . Gabapentin Other (See Comments)    hallucinations      Social History   Socioeconomic History  . Marital status: Divorced    Spouse name: Not on file  . Number of children: Not on file  . Years of education: Not on file  . Highest education level: Not on file  Occupational History  . Occupation: Dealer  . Occupation: Painter  Tobacco Use  . Smoking status: Former Smoker    Packs/day: 1.50    Years: 40.00    Pack years: 60.00    Types: Cigarettes    Quit date: 2014    Years since quitting: 7.9  . Smokeless tobacco: Current User    Types: Chew  Vaping Use  . Vaping Use: Never used  Substance and Sexual Activity  . Alcohol use: Yes    Alcohol/week: 28.0 standard drinks    Types: 28 Cans of beer per week    Comment: 4 beers a night; + jitters with not drinking, denies DTs or seizures  . Drug use: Yes    Types: Marijuana    Comment: daily; quit using crack and methamphetamine about 5 months ago   . Sexual activity: Not Currently    Partners: Female  Other Topics Concern  . Not on file  Social History Narrative   Lives alone near [place] Determinants of Health   Financial Resource Strain: Not on file  Food Insecurity: Not on file  Transportation Needs: Not on file  Physical Activity: Not on file  Stress: Not on file  Social Connections: Not on file  Intimate Partner Violence: Not on file    Physical Exam      No future appointments. CBG EMS-140 BP 132/80   Pulse 88   Temp 97.9 F (36.6 C)   Resp 20   SpO2 97%   Pt reports his breathing is doing bad. Pt denies c/p. He is using inhalers and his 02.  He is still working but states he is trying to take it easy. He does pressure washing trucks, equipment.  I have talked to him many many times about strenuous work like this and he will not quit working.  He is out of his folic acid. [name] said she would get it OTC.  He was suppose to go see PCP again in 2 wks after his Nov visit but he didn't set up anything.  He said he would call to get appointment.  He has not been checking his CBG's. I got him to do the procedure himself. He had trouble getting the blood from his finger so I showed him how to do that as well.  He has the trembles bad.  meds verified. Lungs sound ~ok Will f/u in a few wks.  [name], [facility] Paramedic  12/20/19

## 2019-12-27 ENCOUNTER — Telehealth: Payer: Self-pay | Admitting: Critical Care Medicine

## 2019-12-27 NOTE — Telephone Encounter (Signed)
Attempted to copy CRM into telephone encounter, but did not copy.   Received CRM from [facility] for patient for a dermatology referral because patient states he has a rash all over his body.   Please follow up.

## 2019-12-27 NOTE — Telephone Encounter (Signed)
I sent a referral to derm over a month ago

## 2020-01-01 MED FILL — predniSONE 20 MG TABS: 20 | 30 days supply | Qty: 30 | Fill #2

## 2020-01-03 NOTE — Telephone Encounter (Signed)
I Spoke to his sister and she is aware of the appointment details  and she is going to call James [name] to see if he can be seen sooner  .

## 2020-01-05 DIAGNOSIS — J9621 Acute and chronic respiratory failure with hypoxia: Secondary | ICD-10-CM | POA: Diagnosis not present

## 2020-01-05 DIAGNOSIS — J9601 Acute respiratory failure with hypoxia: Secondary | ICD-10-CM | POA: Diagnosis not present

## 2020-01-05 DIAGNOSIS — F79 Unspecified intellectual disabilities: Secondary | ICD-10-CM | POA: Diagnosis not present

## 2020-01-15 ENCOUNTER — Telehealth: Payer: Self-pay | Admitting: Critical Care Medicine

## 2020-01-15 DIAGNOSIS — I10 Essential (primary) hypertension: Secondary | ICD-10-CM

## 2020-01-15 NOTE — Telephone Encounter (Signed)
Copied from [place] [phone]. Topic: Quick Communication - See Telephone Encounter >> Jan 15, 2020  9:19 AM Loma Boston wrote: CRM for notification. See Telephone encounter for: 01/15/20. Pt sister stated had called yesterday and left a message to call her back as her brother is out of BP meds way to early and wanting to know correct dosage but dose not know the name of med as he took it all and does not have bottle. Want a nurse FU to discuss   [name] at [phone]

## 2020-01-16 ENCOUNTER — Other Ambulatory Visit: Payer: Self-pay | Admitting: Critical Care Medicine

## 2020-01-16 ENCOUNTER — Telehealth: Payer: Self-pay | Admitting: Pharmacist

## 2020-01-16 MED ORDER — LOSARTAN POTASSIUM 100 MG PO TABS: 100.0000 mg | ORAL_TABLET | Freq: Every day | ORAL | 2 refills | Status: DC

## 2020-01-16 MED ORDER — METOPROLOL SUCCINATE ER 25 MG PO TB24
25.0000 mg | ORAL_TABLET | Freq: Every day | ORAL | 3 refills | Status: DC
Start: 2020-01-16 — End: 2020-01-16

## 2020-01-16 NOTE — Telephone Encounter (Signed)
Patient sister has concerns that patient is not taking inhalers and sound winded. She would like to speak to [name] about his breathing concerns.  Also stated that the patient is out of medication for BP too soon and pharmacy is unable to refill.

## 2020-01-16 NOTE — Telephone Encounter (Signed)
Pt is reporting to pharmacy that his metoprolol was increased from 25 to 50 mg daily. Therefore, he's been taking two tablets of 25 mg XL. I cannot see where his dose was increased from 25 to 50 mg. Will send to PCP for clarification.

## 2020-01-16 NOTE — Telephone Encounter (Signed)
Pt informed

## 2020-01-16 NOTE — Telephone Encounter (Signed)
Pt will need face to face with any provider, perhaps with [facility] Refill for meds sent

## 2020-01-16 NOTE — Telephone Encounter (Signed)
He is not on 50mg  a day , he is on 25mg  per day of metoprolol

## 2020-01-17 ENCOUNTER — Ambulatory Visit: Payer: Medicaid Other | Admitting: Critical Care Medicine

## 2020-01-17 NOTE — Telephone Encounter (Signed)
Spoke to both patient and patient sister.  Informed them of the available option for an OV with the Mobile Unit operation. Informed where the unit was operating at today. Also informed patient that the medication for BP was sent to the pharmacy at [facility]

## 2020-01-17 NOTE — Progress Notes (Deleted)
Subjective:    Patient ID: James [name], male    DOB: 1960/09/09, 60 y.o.   MRN: [id number]  History of Present Illness: 08/23/18 60 y.o.M with advanced Copd   I saw this patient a week ago and prescribed antibiotics and prednisone for what I perceived to be persisting pneumonia.  The patient had a slight improvement but then worsened and came to the emergency room the end of the week and was admitted for 3 days for COPD exacerbation and lower lobe pneumonia.  Chest x-ray did show persistent infiltrates in the lower lobes.  The patient is now discharged and returns to the office for post hospital follow-up.  Below is the discharge summary Dc summary : Admit date: 07/31/2018 Discharge date: 08/01/2018  Time spent: 45 minutes  Recommendations for Outpatient Follow-up:  Patient will be discharged to home.  Patient will need to follow up with primary care provider within one week of discharge.  Patient should continue medications as prescribed.  Patient should follow a heart healthy diet.   Discharge Diagnoses:  Acute on chronic respiratory failure with hypoxia secondary to COPD Exacerbation and pneumonia Essential hypertension  Discharge Condition: Stable  Diet recommendation: heart healthy  Filed Weights  07/31/18 0231 07/31/18 0602 Weight: 85.3 kg 83.4 kg   History of present illness:  on 07/31/2018 by Dr. [name] a 60 y.o.malewith medical history significant ofCOPD. Patient has had 3 prior admits for COPD / PNA thus far this year, most recently in May.  Patient has been being treated for COPD exacerbation and BLL CAP as outpatient by [name] with levaquin and prednisone. Just finished levaquin yesterday. Prednisone remained at 46m daily with plans to initiate a slow taper.  Despite this, patient developed severe SOB and respiratory distress this evening. EMS called and patient noted to be satting 80% on RA. He is not on chronic O2  for his COPD.  Hospital Course:  Acute on chronic respiratory failure with hypoxia secondary to COPD Exacerbation and pneumonia -Patient noted to have oxygen saturations in the 80s on room air -Chest x-ray noted for pneumonia -Was recently placed on Levaquin and steroids by PCP approximately 1 week ago -On admission, patient continued to have wheezing well into his hospital stay day 1.  Patient currently still has some wheezing however has improved. -Was placed on Solu-Medrol, along with nebulizer treatments, azithromycin and ceftriaxone -Patient did require oxygen upon admission however has been able to ambulate and maintain oxygen saturations in the high 90s on room air. -Will discharge patient with prednisone taper, azithromycin and Ceftin. -Given that patient had oxygen saturations in the 80s on admission, it was thought that he would require several days in the hospital. Patient actually improved quicker than expected.  Essential hypertension -Continue losartan  Since discharge the patient is tried to go back to work but has extreme difficulty in the heat with this.  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  10/05/2018 This patient's not been seen since the end of July he had failed several follow-up visits.  He did see Dr. WShyrl Numbersend of August who did not make  major changes in his medication program.  He is to remain on Dulera and Incruse.  The patient after being admitted in July got readmitted early August and then again in early September.  Discharge summaries are as noted below.  During the August admission the patient underwent bronchoscopy and had mucous plugging  removed the culture showed normal flora Note this patient has not been actively smoking recently Admit date: 08/09/2018 Discharge date: 08/11/2018  Discharge Diagnoses:  Acute on chronic hypoxic respiratory failure/sepsis due to multifocal pneumonia COPD Alcohol/marijuana use Essential hypertension  History of present illness:  on 08/09/2018 by Dr. Jennifer Yates James Robertsonis a 60 y.o.malewith medical history significant ofCOPD and ETOH dependence presenting with respiratory distress.He woke up about 530 this AM and couldn't breathe. This just keeps happening. He felt similarly when hospitalized last week. He has been doing fine until today. No h/o OSA, not on CPAP. Not on home O2. +cough, productive of yellowish sputum. It is sometimes hard to get the sputum up. No fever. +wheezing since his last hospitalization. Quit smoking in 2014. He lives in a non-air-conditioned log cabin.  He was hospitalized from 7/27-28 for acute on chronic respiratory failure associated with COPD exacerbation and PNA. This was his 4th admission this year. He was seen by Dr. Doretha Goding on 7/30 with plan for completion of course of Keflex and prednisone, as well as prednisone taper.  Hospital Course:  Acute on chronic hypoxic respiratory failure/sepsis due to multifocal pneumonia -Was recently hospitalized in late July for pneumonia and COPD exacerbation. Was discharged with antibiotics as well as prednisone taper. Patient followed up with Dr. Marquesa Rath on 08/03/2018 with completion of Keflex and prednisone as well. -This is his fourth admission this year for similar presentation -Patient presented with cough and shortness of breath -Chest x-rayon admissionfindings consistent with right-sided multifocal pneumonia -CTA chest showed multifocal pneumonia on the right. COPD including emphysema. Negative for PE. -COVID negative -Upon admission, patient had leukocytosis, fever, tachycardia, tachypnea-  all resolving  -Noted to be hypoxic 88% on room air in the ED -Urine strep pneumoniaand legionellaantigensnegative -Sputum culture: Few GPC, GVR, rare yeast -Blood cultures show no growth -was placed on vancomycin, Zosyn, Solu-Medrol, supplemental oxygen -S/p bronchoscopy 8/6, pending culture results -CXR obtained after bronch on 8/6: almost complete clearing of the hazy infiltrates in the right lung -Discussed with Dr. Smith (pulm), recommended prednisone taper starting with prednisone 40mg daily x 1 week, taper as an outpatient, along with Augmentin. He will follow up with the patient in one week. Given that infection cleared on xray so quickly, question if this is inflammatory vs infection.  -patient able to ambulate today without oxygen and maintained oxygen saturations in the mid 90s- therefore patient does not qualify for home oxygen -patient also had a swallow eval- mild aspiration risk  COPD -Continue nebulizer treatments, steroids, inhalers -Incentive spirometry, flutter valve  Alcohol/marijuana use -Discussed cessation -was placed on continue CIWA protocol  Essential hypertension -Continue Cozaar  Consultants PCCM- pulmonology   Procedures Bronchoscopy   Recommendations for Outpatient Follow-up:  1. Follow up with PCP in 1-2 weeks 2. Please obtain BMP/CBC in one week your next doctors visit.  3. Augmentin and azithromycin for 5 days 4. Advised to continue using bronchodilators every 4-6 hours for the next 4-5 days and then transition to as needed. 5. Advised to take his home prednisone, for next 3 days advised to increase it to 40 mg twice daily then resume back is 20 mg daily    Discharge Condition: Stable CODE STATUS: Full code Diet recommendation: 2 g salt   DC summary from 09/11/18  Adm 9/7  D/C 09/13/18 Brief/Interim Summary: James Robertsonis a 60 y.o.malewith history ofCOPD on prednisone, HTN, alcohol use disorder, marijuana use,tobacco user.  Patient presented secondary to dyspnea and found to have evidence of pneumonia and a COPD exacerbation in addition to sepsis physiology on admission.  There is concerns of possible right lower lobe pneumonia therefore started on Unasyn and azithromycin due to concerns of aspiration.  This was transitioned to Augmentin and azithromycin for total of 5 days at time of discharge.  Secondary to infection, he was having COPD exacerbation therefore on bronchodilators.  He started improving and on the day of discharge he was adamant about going home as he was feeling much better.  He was still having abnormal breath sounds and cough therefore I had requested him to stay another day in the hospital but still wanted to go home.  Instructions were given to him take 40 mg of prednisone for next 3 days and then he can resume back his 20 mg daily.  Advised... home bronchodilators periodically every 4-6 hours for next 4-5 days and then transition to as needed. Advised to follow-up with his PCP in about 1 week.   Discharge Diagnoses:  Active Problems:   Hypertension   COPD exacerbation (HCC)   Marijuana abuse   Pneumonia   Sepsis (HCC)  Acute respiratory distress secondary to right lower lobe pneumonia, community-acquired/aspiration Acute mild exacerbation of COPD Concern for possible aspiration.  Unasyn and azithromycin transition to Augmentin and azithromycin.  We will give him 5 more day course at the time of discharge 40 mg of prednisone for next 3 days then transition to 20 mg daily which is his home dose Advised to use bronchodilators.3  Alcohol use disorder Counseled to quit drinking alcohol.  Advised to continue thiamine, folate and multivitamin  Weakness -He did well with physical therapy.  Tobacco use Marijuana use Cessation discussed on admission  Consultations:  None  Subjective: Still having some cough and congestion but he is adamant about going home today as he is feeling much  better than from admission.  I recommended he stay in the hospital for at least 1 more day to help him feel even better.  He wished to go home. Ambulating in the hallway without additional saturating greater than 90% on room air.   Since discharge this patient went to urgent care on 21 September with an outbreak of shingles over his left lower back.  He is taken a course of valacyclovir and prednisone.  He has severe pain.  He was given gabapentin but this caused hallucinations and then we tried tramadol but this caused severe nausea and vomiting.  Apparently the patient cannot take Tylenol or nonsteroidal anti-inflammatories as well.  The patient states his breathing is been labored.  He still has productive cough of white mucus.  He has no fever.  He has significant back pain because of the shingles. The patient is maintaining the Dulera and Incruse as well as his antihypertensive medications and vitamins     10/12/2018  This patient is seen in return follow-up for acute shingles with postherpetic neuropathy, recent pneumonia, COPD exacerbation, and hypertension.  Patient is markedly improved from the last visit.  He has less cough less shortness of breath.  He has improved pain around the area of the shingles.  He is taking amitriptyline 25 mg at bedtime  The   patient notes less pain.  He does state he had been using the tramadol without nausea or vomiting.  I asked if patient would be interested in the para medicine program and expressed interest in the community para medicine program  11/23/2018 Patient seen in return follow-up and has noted increasing shortness of breath and we had to resume prednisone this patient.  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  He does maintain his inhalers.  He is drinking 4 beers a night.  He is not smoking.  Note the patient continues to live in a home that has standing water under the foundation and in several the rooms and mold on the carpet.  We have  connected this patient with legal aid to see if they can assist him in getting into better housing.  The patient's not had previous allergy testing   12/13/2018 The patient notes increased shortness of breath, wheezing and cough productive of thick yellow mucus.  He is using the flutter valve but is not being is productive with this.  He denies chest pain or fever.  He is down to 2 a day on the prednisone.  He is maintaining his inhalers. Apparently there is repair is being done on the patient's home and he is decided to stay in the home  02/27/2019 This patient is seen in return follow-up for COPD.  The patient has significant mold exposure in the home.  There has been some mitigation of this but is not complete.  The patient just was in the urgent care for COPD exacerbation a week ago treated with a course of antibiotics and prednisone.  He is now off prednisone. Patient states the pain in the back has resolved however he is still having increased shortness of breath.  He still having productive cough of thick yellow mucus.  05/01/2019  Since the last OV the patient has been admitted for Copd flare again. Below is the discharge summary Dc summary:  Admit Date: 04/23/2019 Discharge date: 04/27/2019  Recommendations for Outpatient Follow-up:  1. Follow up with PCP in 1-2 weeks 2. Please obtain CMP/CBC in one week 3. Please repeat chest x-ray in the next 6 to 8 weeks to document resolution of infiltrates, if not may require further work-up.     Brief Narrative: Patient is a77 y.o.malewith history of COPD, intellectual disability, HTN who presented with shortness of breath-found to have acute hypoxic respiratory failure secondary to pneumonia and COPD exacerbation. Patient initially required BiPAP on admission. See below for further details.  Significant events: 4/19>> admit to [facility] for severe hypoxemia requiring BiPAP 4/20>> liberated from BiPAP  Antimicrobial  therapy: Rocephin 4/18>>4/23 Zithromax 4/18>>4/22  Microbiology data: Blood culture: 4/19>> negative  Procedures : None  Consults: None  Brief Hospital Course: Acute hypoxic/hypercarbic respiratory failure secondary to COPD exacerbation and pneumonia:Improved-initially required BiPAP on admission-titrated off BiPAP on 4/20.Now on room air with stable O2 saturations.  Feels much better-treated with Rocephin and Zithromax-we will transition to [facility] for a few more days on discharge.  He will resume his usual inhaler regimen-he will be placed on 20 mg of p.o. prednisone until he is seen by [name].  PCP to repeat a chest x-ray in 6 to 8 weeks to document resolution of infiltrates-if not he may require further work-up to rule out underlying malignancy.  (Per patient's sister-patient probably has been taking more prednisone than he should be-he is unable to read-and is not able to follow directions)  HTN: BPstable-continue losartan  EtOH abuse:No signs  of withdrawal-managed with  Ativan per protocol. Per patient's sister-he drinks around 2-3 bottles of beer on a daily basis.  Intellectual disability:Spoke at length with patient's sister ([name]) today-patient isa illiterate-and has been living on his own for all his life. His other sister lives a mileaway and checks on him daily. Sister acknowledges poor living conditions-but apparently this is the way that patient has lived all his life-and chooses to live this way. Patient does odd jobs and sustainsa livelihood. Patient is currently managing all his finances. [name] does acknowledge that patient will not let any home health services come to his house-he will not go to SNF. Difficult situation-but even though patient is an illiterate/intellectual disability-he understand that he is somewhat deconditioned from his acute illness-and is in need of some help-he does not want therapy services-he understands the risk of falls from  being weak.  This MD had numerous discussions with him regarding his appropriate disposition-all he wants to do is go home-his sister-[name] wants to have him come to her place in [place], [place] for a few weeks-however the patient really does not want to go there-and wants to go home.  I have discussed at length with social worker as well-at this point he is being discharged home at his own request.  His family will keep an eye on him.   Nutrition Problem: Nutrition Problem: Increased nutrient needs Etiology: chronic illness(COPD) Signs/Symptoms: estimated needs Interventions: Ensure Enlive (each supplement provides 350kcal and 20 grams of protein)  Discharge Diagnoses:  Principal Problem:   Acute on chronic respiratory failure with hypoxia and hypercapnia (HCC) Active Problems:   Hypertension   COPD exacerbation (HCC)   Community acquired pneumonia of right lower lobe of lung   Marijuana abuse   Intellectual disability  Since discharge the patient has completed his antibiotics for his right lower lobe pneumonia.  He is on tapering prednisone.  He has developed a rash on his arms and face.  The rash is pruritic in nature.  He has been maintaining his medications as prescribed.   07/23/2019 Since the last visit this patient was readmitted to the hospital between 30 June and 3 July.  Below is a copy of the discharge summary Discharge Diagnosis: 1. Acute hypoxic respiratory 2/2 COPD exacerbation vs pneumonitis 2. GERD 3. EtOH history 4. HTN  Discharge Medications: Allergies as of 07/07/2019     Reactions  Gabapentin Other (See Comments)  hallucinations  Disposition and follow-up:   [name] was discharged from [facility] in Stable condition.  At the hospital follow up visit please address:  1.  COPD exacerbation: Continue daily Albuterol, Dulera, Spiriva, and prednisone 40 mg daily. Sent home with 1 additional day of azithromycin.  Discharged on 2L [place]. F/u allergy labs.  GERD: Severe reflux noted on esophagram, started on pantoprazole daily, may need GI follow up.   2.  Labs / imaging needed at time of follow-up: None  3.  Pending labs/ test needing follow-up: IgE, allergen panel for mold, hypersensitivity pneumonitis  Note since discharge the patient's breathing is back to baseline.  The patient does have some wheezing and minimal productive cough.  Noticed blood pressures ranged up at this visit.  He is maintaining Dulera Spiriva as before he has been maintained on prednisone 40 mg daily and also discharged on 2 L oxygen.  He no longer is actively working at this time.  Note his mold assay did show a mild hypersensitivity to some yeast.  08/23/2019   Patient returns today in follow-up showing increased shortness of breath.  He continues to work outdoors in the heat and this is exacerbating his condition despite being on 20 mg of prednisone daily.  He notes thick yellow mucus that he is coughing up.  He notes increased wheeze.  He has ran out of his Spiriva        10/02/2019 This patient returns after having been in the hospital again for 4 days with discharge summary as noted below. Patient returns today with continued shortness of breath and productive cough. Mucus is still thick and yellow and difficult to raise. He has chest tightness. He still drinking 6-7 beers daily. He is no longer smoking. He is now on oxygen 4 L continuous. He has had his Medicaid approved and is now fully declared disabled. 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 The landlord has a complex relationship with this patient. Patient lives rent free and a home on the landlord's property. The home is not in good repair as documented previously.   Date of Admission: 09/22/2019  3:42 AM Date of Discharge: 09/25/2019 Attending Physician: Lenice Pressman, MD PhD  Discharge Diagnosis: 1. COPD exacerbation, underlying  bronchiectasis 2. Alcohol use disorder 3. Skin rash    Disposition and follow-up:   [name] was discharged from [facility] in Stable condition.  At the hospital follow up visit please address:  1.  Follow up      A. COPD exacerbation, recurrent pneumonias, underlying bronchiectasis - hx of recurrent pneumonia, see hospital course for details on prior workup, follow up with his pulmonologist      B. Alcohol use disorder - had mild withdrawal symptoms, motivated to quit, consider pharmacologic assistance such as naltrexone      C. Skin rash - not improving with triamcinolone, referred to dermatology  2.  Labs / imaging needed at time of follow-up: skin biopsy  3.  Pending labs/ test needing follow-up: none  Follow-up Appointments:  Hospital Course by problem list:  COPDexacerbation Recurrent pneumonia Bronchiectasis on bronchoscopy Presents with increased SOB, but cough baseline and not productive. He has had similar symptoms many times previously. PFTs in April 2021 with FEV1/FVC which was 54.01% of expected. CXR this admission with vague R lower lung field opacity, same location as opacity on prior CXRs. Has had recurrent pneumonia as far back as 2015. bronchoscopy 08/10/2018 for recurrent PNU demonstrating severe chronic bronchitis and bronchiectasis,neutrophil predominance and transbronchial biopsies with benign lung tissue with inflammation. CT on 07/05/19 with no ILD, no centrally obstructing lesion. Esophagram on 07/06/19 with no stenosis but severe reflux which could be contributing to COPD. Other pulm workup significant for IgE WNL, allergen profile for mold equivocal for Candida albicans but negative for all others, negative hypersensitivity pneumonitis panel. Has very good outpatient follow up with pulmonology. Is on Dulera, Spiriva, and albuterol and also prednisone 47m daily, states he has been compliant with all these, though at one point earlier  this admission denied steroid use. Treated with ceftriaxone, azithromycin, and prednisone initially with rapid improvement in SOB. No fevers this admission, cough remains unproductive so did not culture. Discharged on his home Dulera, Spiriva, albuterol, Prednisone. Discharged with oral azithromycin to complete 5 day course.   Alcohol Use Disorder Reports using 2x 24oz beers per day, which is decreased from prior. No hx of withdrawal seizures. CIWA scores max of 15 indicating mild withdrawal, requiring relatively low  amounts of Ativan. On day of discharge had scores of up to 5. Patient is motivated to quit drinking because "I want to live longer." Discharged with PO thiamine.  Skin rash Reports a rash for the past several months on his abdomen and both arms. His PCP has been treating this with triamcinolone, but this has not really helped. Referral provided to dermatology for biopsy/further assessment.  Note the patient's rash on both forearms is still persistent and triamcinolone has not helped  This patient will need a dermatology referral as per discharge summary. Note the patient is still actively drinking alcohol. See shortness of breath assessment below  11/06/2019 This patient is seen by way of a telephone visit and post hospital.  He was just admitted again for recurrent pneumonia this time with Covid infection.  This patient was intubated was severely ill.  He is now on oxygen 4 L continuous and 40 mg daily of prednisone.  He also has a allergic dermatitis rash.  This has not been responsive to steroids and is quite pruritic despite hydroxyzine when seen recently at urgent care.  I went over with this patient at this visit the need to receive assisted care but he refuses and wishes to stay in his current living arrangement which has very poor environment  Below is a copy of the recent discharge summary  Admission date:  10/19/2019  Admitting Physician  No admitting provider for  patient encounter.  Discharge Date:  10/26/2019   Primary MD  James Helfman E, MD  Recommendations for primary care physician for things to follow:  -Patient instructed to follow with PCP/pulmonary as an outpatient, appointment will be rescheduled till he is off quarantine as discussed with the patient and staff. -Please check CBC, CMP during next visit.  Admission Diagnosis  COPD exacerbation (HCC) (J44.1) Acute on chronic respiratory failure with hypoxia (HCC) (J96.21) Pneumonia due to COVID-19 virus (U07.1, J12.82)   Discharge Diagnosis  COPD exacerbation (HCC) (J44.1) Acute on chronic respiratory failure with hypoxia (HCC) (J96.21) Pneumonia due to COVID-19 virus (U07.1, J12.82)    Active Problems:   Acute on chronic respiratory failure with hypoxia (HCC)   Pneumonia due to COVID-19 virus      Past Medical History: Diagnosis Date . Acute on chronic respiratory failure with hypoxia (HCC) 06/08/2013 . CAP (community acquired pneumonia) 05/17/2018  See admit 05/17/18 ? rml  with   covid pcr neg - rx augmentin > f/u cxr in 4-6 weeks is fine unless condition declines  . Community acquired pneumonia of right lower lobe of lung 05/17/2018  See admit 05/17/18 ? rml  with   covid pcr neg - rx augmentin > f/u cxr in 4-6 weeks is fine unless condition declines  . COPD (chronic obstructive pulmonary disease) (HCC)   not on home . Diabetes (HCC)  . Dyspnea  . Hypertension  . Pneumonia 04/06/2016 . Pneumothorax   2016, fell from ladder . 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 Tick bite 06/03/2018   Past Surgical History: Procedure Laterality Date . VIDEO ASSISTED THORACOSCOPY (VATS)/THOROCOTOMY Left 06/11/2013  Procedure: VIDEO ASSISTED THORACOSCOPY (VATS)/THOROCOTOMY;  Surgeon: Peter Van Trigt, MD;  Location: [facility] OR;  Service: Thoracic;  Laterality: Left; . VIDEO BRONCHOSCOPY N/A 06/11/2013  Procedure: VIDEO BRONCHOSCOPY;  Surgeon: Peter Van Trigt, MD;  Location: [facility]  OR;  Service: Thoracic;  Laterality: N/A; . VIDEO BRONCHOSCOPY N/A 08/10/2018  Procedure: VIDEO BRONCHOSCOPY WITH FLUORO;  Surgeon: Smith, Daniel C, MD;  Location: [facility] ENDOSCOPY;  Service: Endoscopy;  Laterality: N/A;        History of present illness and  Hospital Course:     Kindly see H&P for history of present illness and admission details, please review complete Labs, Consult reports and Test reports for all details in brief  HPI  from the history and physical done on the day of admission by PCCM on 10/19/2019  Pt is encephelopathic; therefore, this HPI is obtained from chart review. James Robertsonis a 59 y.o.malewho has a PMH as outlined below.[facility]resented to [facility] 10/15 with significant dyspnea to the point that he could not speak in full sentences. He was placed on CPAP but did not respond; therefore, he was intubated.  After intubation, COVID returned positive.  He has been evaluated by pulmonary on prior admissions given recurrent PNA. He has bronchoscopy in Aug 2020 which showed severe bronchiectasis bilaterally, neutrophil predominance and transbronchial biopsies were benign.   Hospital Course   Sagan D Defalcois a 59 y.o.malewho has a PMH of chronic respiratory failure, COPD, diabetes, hypertension, presented to [facility] ED 10/15 with significant dyspnea to the point that he could not speak in full sentences. He was placed on CPAP but did not respond; therefore, he was intubated.After intubation, COVID returned positive. He has been evaluated by pulmonary on prior admissions given recurrent PNA. He has bronchoscopy in Aug 2020 which showed severe bronchiectasis bilaterally, neutrophil predominance and transbronchial biopsies were benign. Patient was successfully extubated 10/17, and he was transferred to [facility] on 10/19.   Acute on chronic hypoxic Resp. Failure due to Acute Covid 19 Viral Pneumonitis during the ongoing 2020 Covid 19 Pandemic   -Unvaccinated, he is a critically ill on presentation required intubation.  He was successfully extubated after 48 hours, with increased oxygen requirement initially, he is currently back at his baseline oxygen requirement, report requiring 2 L at rest, 4 L with activity. -He is extubated 10/17. -Treated initially with IV steroids, he will be discharged on home dose prednisone 20 mg oral daily (this was confirmed with with the patient that he is at current dose, he was given a refill for next 30 days till he is seen by PCP) -He was treated with remdesivir during hospital stay, as well he was treated with baricitinib as well -Was encouraged to take his incentive spirometry and flutter valve at home and continue using them .   COPD exacerbation -Treated with antibiotics for 5 days, continue with bronchodilators, continue with steroid  .  Hypertension -Continue with home dose metoprolol, and Cozaar  Diabetes mellitus -We will hold home dose Metformin, will keep on sliding scale during hospital stay, A1c is 6.8  History of alcohol abuse -No evidence of withdrawal during hospital stay, continue with thiamine and folic acid on discharge  Discharge Condition:  stable   Patient states his breathing is back at baseline he states he is no longer drinking alcohol he states his blood sugars have been in the 100 range he complains of his left hand feeling numb and is not able to sleep at night due to agitation   11/13/19 This patient was seen in return follow-up after had been seen by way of a telephone visit a week ago.  He was just hospitalized note discharge summary is as above.  He had a Covid pneumonia and respiratory failure was on mechanical ventilator.  Today he comes in on 4 L oxygen nighttime only been on room air today is 95%.  He has advanced COPD.  He had significant alcohol abuse he states he is no longer   drinking alcohol.  He still complains of bilateral rashes on the arms and  the umbilical area.  See shortness of breath assessment below  11/19/2019 This patient is seen again in short-term follow-up after having just been seen last week.  He came to the emergency room on 12 November in acute distress.  He was given high-dose IV steroids intensive neb treatments and discharged.  The patient states since taking the Diflucan and triamcinolone cream his rash is improved on his arms.  He states his breathing is getting back to baseline.  He still using oxygen 4 L at bedtime.  He still has thick tenacious mucus with thick yellow mucus.  He is off all antibiotics.  He is on prednisone 40 mg daily.  The patient continues to adamantly refuse leaving his current housing.  He is not regularly checking his blood sugar at home.  See assessment below  12/03/2019 Short term f/u COPD   No smoking since 2014.  Still in current housing. No ETOH use  The patient today states his breathing is somewhat improved he has no cough he is off oxygen.  He does complain of right-sided wrist pain he cannot recall if he injured the wrist.  This is been going on for 1 week.  Patient states he has some pruritus but his rash is overall improved.  He is no longer drinking alcohol.  He is not smoking.  He is yet to receive a Covid vaccine.  He is due a flu vaccine at this visit.   01/17/2020   Shortness of Breath This is a chronic problem. The current episode started more than 1 year ago. The problem occurs daily (qhs shortness of breath). The problem has been gradually improving. Pertinent negatives include no chest pain, claudication, fever, headaches, hemoptysis, leg pain, leg swelling, orthopnea, PND, rash, sore throat, sputum production, syncope, vomiting or wheezing. The symptoms are aggravated by any activity, lying flat and occupational exposure. Associated symptoms comments: gerd  . Risk factors include smoking. He has tried beta agonist inhalers, oral steroids and steroid inhalers for the  symptoms. The treatment provided moderate relief. His past medical history is significant for COPD and pneumonia. There is no history of asthma, CAD, DVT, a heart failure or PE.    Past Medical History:  Diagnosis Date  . Acute on chronic respiratory failure with hypoxia ([place]) 06/08/2013  . CAP (community acquired pneumonia) 05/17/2018   See admit 05/17/18 ? rml  with   covid pcr neg - rx augmentin > f/u cxr in 4-6 weeks is fine unless condition declines   . Community acquired pneumonia of right lower lobe of lung 05/17/2018   See admit 05/17/18 ? rml  with   covid pcr neg - rx augmentin > f/u cxr in 4-6 weeks is fine unless condition declines   . COPD (chronic obstructive pulmonary disease) ([place])    not on home  . Diabetes ([place])   . Dyspnea   . Hypertension   . Pneumonia 04/06/2016  . Pneumonia due to COVID-19 virus 10/19/2019  . Pneumothorax    2016, fell from ladder  . 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 Tick bite 06/03/2018     Family History  Problem Relation Age of Onset  . Heart disease Father   . COPD Sister      Social History   Socioeconomic History  . Marital status: Divorced    Spouse name: Not on file  . Number of children: Not on file  . Years  of education: Not on file  . Highest education level: Not on file  Occupational History  . Occupation: Curator  . Occupation: Painter  Tobacco Use  . Smoking status: Former Smoker    Packs/day: 1.50    Years: 40.00    Pack years: 60.00    Types: Cigarettes    Quit date: 2014    Years since quitting: 8.0  . Smokeless tobacco: Current User    Types: Chew  Vaping Use  . Vaping Use: Never used  Substance and Sexual Activity  . Alcohol use: Yes    Alcohol/week: 28.0 standard drinks    Types: 28 Cans of beer per week    Comment: 4 beers a night; + jitters with not drinking, denies DTs or seizures  . Drug use: Yes    Types: Marijuana    Comment: daily; quit using crack and methamphetamine about 5 months ago   .  Sexual activity: Not Currently    Partners: Female  Other Topics Concern  . Not on file  Social History Narrative   Lives alone near [place]   Social Determinants of Health   Financial Resource Strain: Not on file  Food Insecurity: Not on file  Transportation Needs: Not on file  Physical Activity: Not on file  Stress: Not on file  Social Connections: Not on file  Intimate Partner Violence: Not on file     Allergies  Allergen Reactions  . Gabapentin Other (See Comments)    hallucinations     Outpatient Medications Prior to Visit  Medication Sig Dispense Refill  . Accu-Chek Softclix Lancets lancets Use as instructed to check blood sugar once daily. (Patient taking differently: 1 each by Other route See admin instructions. Use as instructed to check blood sugar once daily.) 100 each 2  . albuterol (PROVENTIL) (2.5 MG/3ML) 0.083% nebulizer solution Take 3 mLs (2.5 mg total) by nebulization in the morning, at noon, and at bedtime. 270 mL 1  . albuterol (VENTOLIN HFA) 108 (90 Base) MCG/ACT inhaler Inhale 2 puffs into the lungs every 4 (four) hours as needed for wheezing or shortness of breath. 1 each 0  . Blood Glucose Monitoring Suppl (ACCU-CHEK GUIDE ME) w/Device KIT 1 each by Other route in the morning and at bedtime.     . Blood Glucose Monitoring Suppl (TRUE METRIX METER) w/Device KIT Use to measure blood sugar twice a day (Patient taking differently: 1 each by Other route See admin instructions. Use to measure blood sugar twice a day) 1 kit 0  . budesonide-formoterol (SYMBICORT) 160-4.5 MCG/ACT inhaler Inhale 2 puffs into the lungs 2 (two) times daily. 1 each 12  . folic acid (FOLVITE) 1 MG tablet Take 1 tablet (1 mg total) by mouth daily. 90 tablet 0  . furosemide (LASIX) 20 MG tablet Take 1 tablet (20 mg total) by mouth daily as needed for edema (lower extremity). (Patient not taking: No sig reported) 30 tablet 3  . glucose blood (TRUE METRIX BLOOD GLUCOSE TEST) test strip Use  as instructed (Patient not taking: Reported on 12/20/2019) 100 each 12  . hydrOXYzine (ATARAX/VISTARIL) 25 MG tablet Take 1 tablet (25 mg total) by mouth every 8 (eight) hours as needed for anxiety or itching. (Patient not taking: No sig reported) 30 tablet 0  . losartan (COZAAR) 100 MG tablet Take 1 tablet (100 mg total) by mouth daily. 90 tablet 2  . metFORMIN (GLUCOPHAGE) 500 MG tablet Take 2 tablets (1,000 mg total) by mouth 2 (two) times daily with  a meal. 240 tablet 0  . metoprolol succinate (TOPROL-XL) 25 MG 24 hr tablet Take 1 tablet (25 mg total) by mouth daily. 90 tablet 3  . multivitamin-iron-minerals-folic acid (CENTRUM) chewable tablet Chew 1 tablet by mouth daily. (Patient not taking: No sig reported) 90 tablet 2  . pantoprazole (PROTONIX) 40 MG tablet Take 1 tablet (40 mg total) by mouth daily. 30 tablet 6  . predniSONE (DELTASONE) 20 MG tablet Take 2 tablet daily for 7 days then 1 daily and STAY (Patient taking differently: 20 mg daily with breakfast.) 60 tablet 1  . Respiratory Therapy Supplies (FLUTTER) DEVI Use 4 times daily (Patient taking differently: 1 each by Other route See admin instructions. Use 4 times daily) 1 each 0  . thiamine (VITAMIN B-1) 100 MG tablet Take 1 tablet (100 mg total) by mouth daily. 30 tablet 3  . Tiotropium Bromide Monohydrate (SPIRIVA RESPIMAT) 2.5 MCG/ACT AERS Two puff daily (Patient taking differently: Inhale 2 puffs into the lungs daily.) 4 g 11  . triamcinolone cream (KENALOG) 0.1 % Apply 1 application topically 2 (two) times daily. (Patient not taking: Reported on 12/20/2019) 30 g 0   No facility-administered medications prior to visit.     Review of Systems  Constitutional: Negative for fever.  HENT: Negative for sinus pressure, sinus pain, sore throat and trouble swallowing.   Eyes: Negative.   Respiratory: Positive for cough and shortness of breath. Negative for hemoptysis, sputum production, chest tightness and wheezing.   Cardiovascular:  Negative for chest pain, palpitations, orthopnea, claudication, leg swelling, syncope and PND.  Gastrointestinal: Negative for nausea and vomiting.  Genitourinary: Negative.   Musculoskeletal: Positive for back pain.  Skin: Negative for rash.  Neurological: Negative for headaches.  Psychiatric/Behavioral: Negative for hallucinations. The patient is not nervous/anxious and is not hyperactive.        Objective:   Physical Exam There were no vitals filed for this visit. Gen: Pleasant, well-nourished, in no distress,  normal affect  ENT: No lesions,  mouth clear,  oropharynx clear, no postnasal drip, edentulous  Neck: No JVD, no TMG, no carotid bruits  Lungs: No use of accessory muscles, no dullness to percussion, distant breath sounds without wheezes  Cardiovascular: RRR, heart sounds normal, no murmur or gallops, no peripheral edema  Abdomen: soft and NT, no HSM,  BS normal  Musculoskeletal: Edema and tenderness in the lateral aspect of the right forearm with slight defect, no cyanosis or clubbing  Neuro: alert, non focal  Skin: Warm, no lesions , erythematous rash both forearms and umbilical area is improved   Disability eval at [facility]:   Pulmonary Function Test (04/19/2019 1:42 PM EDT) Pulmonary Function Test (04/19/2019 1:42 PM EDT)  Component Value Ref Range Performed At Pathologist Signature  FVC Post 2.51 L [facility]   FEV1 Post 1.55 L [facility]   FEV1/FVC Post 61.87 % [facility]   FEF25-75% Post 0.90 L/s [facility]   PEF Post 3.42 L/s [facility]   MVV Post 42.00 L/min [facility]   FVC Pre 2.92 L [facility]   FEV1 Pre 1.57 L [facility]   FEV1/FVC Pre 54.01 % [facility]   FEF25-75% Pre 0.68 L/s [facility]   PEF Pre 4.14 L/s [facility]   MVV Pre 45.00 L/min  [facility]   FVC_LLN 3.73  [facility]   FVC_Z-SCORE -2.84  [facility]   FVC_%PRED 60 % % [facility]   FVC_Z-SCORE -2.84  [facility]   FVC_%PRED 52 % % [facility]   FVC %Chng -14 % % [facility]   FEV1_LLN 2.86  [facility]   FEV1_Z-SCORE -3.78  [facility]   FEV1_%PRED 42 % % [facility]   FEV1_Z-SCORE -3.78  [facility]   FEV1_%PRED 41 % % [facility]   FEV1 %Chng -2 % % [facility]   FEV1/FVC_LLN 66  [facility]   FEV1/FVC %Chng 15 % % [facility]   FEF25-75%_LLN 1.60  [facility]   FEF25-75%_Z-SCORE -3.05  [facility]   FEF25-75%_%PRED 21 % % [facility]   FEF25-75%_Z-SCORE -3.05  [facility]   FEF25-75%_%PRED 28 % % [facility]   FEF25-75% %Chng 33 % % [facility]   PEF_LLN 7.25  [facility]   PEF_%PRED 43 % 36 % % [facility]   PEF %Chng -17 % % [facility]   MVV_LLN 130  [facility]   MVV_%PRED 35 % 32 % % [facility]   MVV %Chng -7 % % [facility]     Media Information    Document Information  Photos    11/02/2018 14:43  Attached To:  [name]  Source Information  [name], MD  [facility]   Pertinent Labs, Studies, and Procedures:  DG Chest Portable 1 View  Result Date: 09/22/2019 CLINICAL DATA:  Dyspnea. COPD exacerbation. Chest pain and shortness of breath. EXAM: PORTABLE CHEST 1 VIEW COMPARISON:  Cyst 07/05/2019 FINDINGS: Stable cardiomediastinal contours. Chronic blunting of the left costophrenic angle due to pleuroparenchymal  scarring. Changes of emphysema noted with asymmetric increase hazy opacity within the right lung base concerning for pneumonia. Numerous remote bilateral rib fracture deformities. IMPRESSION: 1. Increased hazy opacity within the right lung base concerning for pneumonia. 2. Emphysema. Electronically Signed   By: Kerby Moors M.D.   On: 09/22/2019 04:20     BMP Latest Ref Rng & Units 11/16/2019 10/24/2019 10/23/2019  Glucose 70 - 99 mg/dL 113(H) 130(H) 139(H)  BUN 6 - 20 mg/dL 15 31(H) 34(H)  Creatinine 0.61 - 1.24 mg/dL 0.98 0.73 0.66  BUN/Creat Ratio 9 - 20 - - -  Sodium 135 - 145 mmol/L 144 139 138  Potassium 3.5 - 5.1 mmol/L 4.0 4.6 4.6  Chloride 98 - 111 mmol/L 103 99 99  CO2 22 - 32 mmol/L $RemoveB'27 31 30  '[email or web address]$ Calcium 8.9 - 10.3 mg/dL 9.4 9.4 9.2   Hepatic Function Panel     Component Value Date/Time   PROT 5.0 (L) 10/20/2019 0050   PROT 5.9 (L) 07/23/2019 0900   ALBUMIN 2.8 (L) 10/20/2019 0050   ALBUMIN 4.0 07/23/2019 0900   AST 18 10/20/2019 0050   ALT 22 10/20/2019 0050   ALT 62 (H) 06/23/2016 1226   ALKPHOS 36 (L) 10/20/2019 0050   BILITOT 0.7 10/20/2019 0050   BILITOT <0.2 07/23/2019 0900   BILIDIR <0.1 10/20/2019 0050   BILIDIR 0.12 04/16/2016 1601   IBILI NOT CALCULATED 10/20/2019 0050   CBC Latest Ref Rng & Units 11/16/2019 10/24/2019 10/21/2019  WBC 4.0 -  10.5 K/uL 10.4 17.3(H) 23.8(H)  Hemoglobin 13.0 - 17.0 g/dL 13.4 13.9 13.0  Hematocrit 39.0 - 52.0 % 42.1 43.0 40.8  Platelets 150 - 400 K/uL 301 300 286       Assessment & Plan:  I personally reviewed all images and lab data in the [facility] system as well as any outside material available during this office visit and agree with the  radiology impressions.   No problem-specific Assessment & Plan notes found for this encounter.   There are no diagnoses linked to this encounter.  Flu vaccine was given

## 2020-01-21 MED FILL — METOPROLOL SUCCINATE ER 25: 25 | 90 days supply | Qty: 90 | Fill #1

## 2020-01-22 ENCOUNTER — Other Ambulatory Visit (HOSPITAL_COMMUNITY): Payer: Self-pay

## 2020-01-22 NOTE — Progress Notes (Signed)
Paramedicine Encounter    Patient ID: James [name], male    DOB: Dec 21, 1960, 60 y.o.   MRN: [id number]   Patient Care Team: [name], MD as PCP - General (Pulmonary Disease) O'Neal, Cassie Freer, MD as PCP - Cardiology (Cardiology) [name], RN as Case Manager [name], [facility] as Pharmacist (Pharmacist)  Patient Active Problem List   Diagnosis Date Noted  . Pain of right forearm 12/03/2019  . Bronchiectasis ([place]) 09/24/2019  . Type 2 diabetes mellitus without complication, without long-term current use of insulin ([place]) 07/24/2019  . Other atopic dermatitis 05/01/2019  . Chronic respiratory failure with hypoxia ([place]) 04/23/2019  . Intellectual disability 04/23/2019  . History of alcohol use 11/23/2018  . Hypertension 05/17/2016  . History of tobacco abuse 04/06/2016  . COPD mixed type ([place]) 03/26/2016    Current Outpatient Medications:  .  albuterol (VENTOLIN HFA) 108 (90 Base) MCG/ACT inhaler, Inhale 2 puffs into the lungs every 4 (four) hours as needed for wheezing or shortness of breath., Disp: 1 each, Rfl: 0 .  budesonide-formoterol (SYMBICORT) 160-4.5 MCG/ACT inhaler, Inhale 2 puffs into the lungs 2 (two) times daily., Disp: 1 each, Rfl: 12 .  folic acid (FOLVITE) 1 MG tablet, Take 1 tablet (1 mg total) by mouth daily., Disp: 90 tablet, Rfl: 0 .  losartan (COZAAR) 100 MG tablet, Take 1 tablet (100 mg total) by mouth daily., Disp: 90 tablet, Rfl: 2 .  metFORMIN (GLUCOPHAGE) 500 MG tablet, Take 2 tablets (1,000 mg total) by mouth 2 (two) times daily with a meal., Disp: 240 tablet, Rfl: 0 .  pantoprazole (PROTONIX) 40 MG tablet, Take 1 tablet (40 mg total) by mouth daily., Disp: 30 tablet, Rfl: 6 .  predniSONE (DELTASONE) 20 MG tablet, Take 2 tablet daily for 7 days then 1 daily and STAY (Patient taking differently: 20 mg daily with breakfast.), Disp: 60 tablet, Rfl: 1 .  Respiratory Therapy Supplies (FLUTTER) DEVI, Use 4 times daily (Patient taking  differently: 1 each by Other route See admin instructions. Use 4 times daily), Disp: 1 each, Rfl: 0 .  Tiotropium Bromide Monohydrate (SPIRIVA RESPIMAT) 2.5 MCG/ACT AERS, Two puff daily (Patient taking differently: Inhale 2 puffs into the lungs daily.), Disp: 4 g, Rfl: 11 .  triamcinolone cream (KENALOG) 0.1 %, Apply 1 application topically 2 (two) times daily., Disp: 30 g, Rfl: 0 .  Accu-Chek Softclix Lancets lancets, Use as instructed to check blood sugar once daily. (Patient not taking: Reported on 01/22/2020), Disp: 100 each, Rfl: 2 .  albuterol (PROVENTIL) (2.5 MG/3ML) 0.083% nebulizer solution, Take 3 mLs (2.5 mg total) by nebulization in the morning, at noon, and at bedtime., Disp: 270 mL, Rfl: 1 .  Blood Glucose Monitoring Suppl (ACCU-CHEK GUIDE ME) w/Device KIT, 1 each by Other route in the morning and at bedtime.  (Patient not taking: Reported on 01/22/2020), Disp: , Rfl:  .  Blood Glucose Monitoring Suppl (TRUE METRIX METER) w/Device KIT, Use to measure blood sugar twice a day (Patient not taking: Reported on 01/22/2020), Disp: 1 kit, Rfl: 0 .  furosemide (LASIX) 20 MG tablet, Take 1 tablet (20 mg total) by mouth daily as needed for edema (lower extremity). (Patient not taking: No sig reported), Disp: 30 tablet, Rfl: 3 .  glucose blood (TRUE METRIX BLOOD GLUCOSE TEST) test strip, Use as instructed (Patient not taking: No sig reported), Disp: 100 each, Rfl: 12 .  hydrOXYzine (ATARAX/VISTARIL) 25 MG tablet, Take 1 tablet (25 mg total) by mouth  every 8 (eight) hours as needed for anxiety or itching. (Patient not taking: No sig reported), Disp: 30 tablet, Rfl: 0 .  metoprolol succinate (TOPROL-XL) 25 MG 24 hr tablet, Take 1 tablet (25 mg total) by mouth daily. (Patient not taking: Reported on 01/22/2020), Disp: 90 tablet, Rfl: 3 .  multivitamin-iron-minerals-folic acid (CENTRUM) chewable tablet, Chew 1 tablet by mouth daily. (Patient not taking: No sig reported), Disp: 90 tablet, Rfl: 2 .  thiamine  (VITAMIN B-1) 100 MG tablet, Take 1 tablet (100 mg total) by mouth daily. (Patient not taking: Reported on 01/22/2020), Disp: 30 tablet, Rfl: 3 Allergies  Allergen Reactions  . Gabapentin Other (See Comments)    hallucinations      Social History   Socioeconomic History  . Marital status: Divorced    Spouse name: Not on file  . Number of children: Not on file  . Years of education: Not on file  . Highest education level: Not on file  Occupational History  . Occupation: Dealer  . Occupation: Painter  Tobacco Use  . Smoking status: Former Smoker    Packs/day: 1.50    Years: 40.00    Pack years: 60.00    Types: Cigarettes    Quit date: 2014    Years since quitting: 8.0  . Smokeless tobacco: Current User    Types: Chew  Vaping Use  . Vaping Use: Never used  Substance and Sexual Activity  . Alcohol use: Yes    Alcohol/week: 28.0 standard drinks    Types: 28 Cans of beer per week    Comment: 4 beers a night; + jitters with not drinking, denies DTs or seizures  . Drug use: Yes    Types: Marijuana    Comment: daily; quit using crack and methamphetamine about 5 months ago   . Sexual activity: Not Currently    Partners: Female  Other Topics Concern  . Not on file  Social History Narrative   Lives alone near [place] Determinants of Health   Financial Resource Strain: Not on file  Food Insecurity: Not on file  Transportation Needs: Not on file  Physical Activity: Not on file  Stress: Not on file  Social Connections: Not on file  Intimate Partner Violence: Not on file    Physical Exam      Future Appointments  Date Time Provider [place]  02/13/2020  9:00 AM [name], MD [facility] None    BP (!) 148/90   Pulse 98   Temp (!) 96.2 F (35.7 C)   Resp 18   SpO2 97%  CBG EMS-130  Pt reports he is not feeling past couple days. He got mixed up on his metoprolol dosage and was taking it twice a day instead of once a day. He has been  out of this med for a while now.  That rx has been sent to pharmacy. He said him and teresa are not on speaking terms right now. He said he didn't know why she isnt speaking to him at the moment but he did give me permission to call her and ask to get it from pharmacy so I will do that once I leave his house.  He said he does feel slight sob, he does have a cough with yellow sputum but that is not new for him but the feeling tired and weak is. He has not worked in a few days.  Pt denies c/p, no dizziness. No h/a.  He is out of  the folic acid and thiamine.  --pharmacy said insurance will not cover it due to OTC.  Also out of the prednisone for past couple days. --pharmacy said it was 8 days too soon to fill.  Not been taking his CBG's.  His sister reports that his behavior has changed and he has been mean towards her. [place]  [name] isnt sure if he is back drinking again-he denies it.  Sister also reports he has blown thousands of dollars past several months on tools/equipment and he wont even buy a decent heater or a/c for the house.   It was very cool in the house during visit, he had a small omish heater by his couch. He had short sleeve on and long pants but was shivering like he was cold. But I am not sure if that is from the lack of prednisone.  Sister said he has been taking more metoprolol b/c it makes him feel better, he isnt using the inhalers b/c it doesn't work. He refuses to check his CBG's and he doesn't want to learn how to per teresa. I have showed him a few times how to use it.  Will f/u soon. I have asked teresa to assist in making sure he gets his meds. She said if the prednisone isnt ready when he gets there he will make a scene.     [name], [place] [facility] Paramedic  01/22/20

## 2020-01-28 MED FILL — predniSONE 20 MG TABS: 20 | 16 days supply | Qty: 16 | Fill #3

## 2020-02-05 ENCOUNTER — Telehealth: Payer: Self-pay | Admitting: Critical Care Medicine

## 2020-02-05 DIAGNOSIS — J9601 Acute respiratory failure with hypoxia: Secondary | ICD-10-CM | POA: Diagnosis not present

## 2020-02-05 DIAGNOSIS — F79 Unspecified intellectual disabilities: Secondary | ICD-10-CM | POA: Diagnosis not present

## 2020-02-05 DIAGNOSIS — J9621 Acute and chronic respiratory failure with hypoxia: Secondary | ICD-10-CM | POA: Diagnosis not present

## 2020-02-05 NOTE — Telephone Encounter (Signed)
Copied from [place] [id number]. Topic: General - Inquiry >> Feb 04, 2020 12:19 PM Greggory Keen D wrote: New tubing.  CB# [phone] >> Feb 04, 2020 12:25 PM Greggory Keen D wrote: Pt's sister called asking to speak with Opal Sidles regarding her brothers oxygen machine.  She said she thinks it is in the tubing that ist is not working properly.

## 2020-02-05 NOTE — Telephone Encounter (Signed)
Call returned to patient's sister, [name]. She explained that the patient needs new tubing for his nebulizer and back up tubing for POC.  She said he was given the POC from the hospital.  Instructed her to contact [facility] regarding the POC and if they do not have him on record, then contact [facility]. She said she understood and would follow up.   She also stated that he needs to tubing for the nebulizer asap.  Informed her that she can pick up the neb tubing at [facility] this afternoon and she said she would pick it up.

## 2020-02-11 MED FILL — predniSONE 20 MG TABS: 20 | 30 days supply | Qty: 30 | Fill #2

## 2020-02-13 ENCOUNTER — Other Ambulatory Visit: Payer: Self-pay

## 2020-02-13 ENCOUNTER — Encounter: Payer: Self-pay | Admitting: Critical Care Medicine

## 2020-02-13 ENCOUNTER — Other Ambulatory Visit: Payer: Self-pay | Admitting: Critical Care Medicine

## 2020-02-13 ENCOUNTER — Ambulatory Visit: Payer: MEDICAID | Attending: Critical Care Medicine | Admitting: Critical Care Medicine

## 2020-02-13 VITALS — BP 136/78 | HR 91 | Resp 24 | Wt 202.0 lb

## 2020-02-13 DIAGNOSIS — L2089 Other atopic dermatitis: Secondary | ICD-10-CM

## 2020-02-13 DIAGNOSIS — J449 Chronic obstructive pulmonary disease, unspecified: Secondary | ICD-10-CM | POA: Diagnosis not present

## 2020-02-13 DIAGNOSIS — Z1211 Encounter for screening for malignant neoplasm of colon: Secondary | ICD-10-CM

## 2020-02-13 DIAGNOSIS — J9611 Chronic respiratory failure with hypoxia: Secondary | ICD-10-CM

## 2020-02-13 DIAGNOSIS — M79631 Pain in right forearm: Secondary | ICD-10-CM

## 2020-02-13 DIAGNOSIS — E119 Type 2 diabetes mellitus without complications: Secondary | ICD-10-CM | POA: Diagnosis not present

## 2020-02-13 LAB — POCT GLYCOSYLATED HEMOGLOBIN (HGB A1C): HbA1c, POC (prediabetic range): 6.3 % (ref 5.7–6.4)

## 2020-02-13 LAB — GLUCOSE, POCT (MANUAL RESULT ENTRY): POC Glucose: 141 mg/dl — AB (ref 70–99)

## 2020-02-13 MED ORDER — ALBUTEROL SULFATE (2.5 MG/3ML) 0.083% IN NEBU: 2.5000 mg | INHALATION_SOLUTION | Freq: Three times a day (TID) | RESPIRATORY_TRACT | 1 refills | Status: DC

## 2020-02-13 MED ORDER — TRIAMCINOLONE ACETONIDE 0.1 % EX CREA: 1.0000 "application " | TOPICAL_CREAM | Freq: Two times a day (BID) | CUTANEOUS | 0 refills | Status: DC

## 2020-02-13 MED ORDER — PREDNISONE 20 MG PO TABS
20.0000 mg | ORAL_TABLET | Freq: Every day | ORAL | 3 refills | Status: DC
Start: 2020-02-13 — End: 2020-03-28

## 2020-02-13 MED ORDER — SPIRIVA RESPIMAT 2.5 MCG/ACT IN AERS: 2.0000 | INHALATION_SPRAY | Freq: Every day | RESPIRATORY_TRACT | 6 refills | Status: DC

## 2020-02-13 MED FILL — TRIAMCINOLONE 0.1% CREAM: 0.1 | 14 days supply | Qty: 30 | Fill #0

## 2020-02-13 MED FILL — ALBUTEROL SUL 2.5 MG/3 ML S: (2.5 MG/3ML | 30 days supply | Qty: 270 | Fill #0

## 2020-02-13 MED FILL — SPIRIVA RESPIMAT 2.5 MCG IN: 2.5 | 30 days supply | Qty: 4 | Fill #0

## 2020-02-13 NOTE — Patient Instructions (Signed)
No change in medications  We discussed the Covid vaccine you declined  Referral to dermatology will be made  Pick up a fecal occult kit on the way out put a sample stool and bring it back drop it off so we can screen you for colon cancer  Return to [name] 2 months  Your diabetes is under good control

## 2020-02-13 NOTE — Assessment & Plan Note (Signed)
COPD with associated chronic bronchiectasis chronic hypoxic respiratory failure  No change in medications at this time

## 2020-02-13 NOTE — Progress Notes (Signed)
F/u DM and COPD

## 2020-02-13 NOTE — Progress Notes (Signed)
Subjective:    Patient ID: James [name], male    DOB: 1960/09/09, 60 y.o.   MRN: [id number]  History of Present Illness: 08/23/18 60 y.o.M with advanced Copd   I saw this patient a week ago and prescribed antibiotics and prednisone for what I perceived to be persisting pneumonia.  The patient had a slight improvement but then worsened and came to the emergency room the end of the week and was admitted for 3 days for COPD exacerbation and lower lobe pneumonia.  Chest x-ray did show persistent infiltrates in the lower lobes.  The patient is now discharged and returns to the office for post hospital follow-up.  Below is the discharge summary Dc summary : Admit date: 07/31/2018 Discharge date: 08/01/2018  Time spent: 45 minutes  Recommendations for Outpatient Follow-up:  Patient will be discharged to home.  Patient will need to follow up with primary care provider within one week of discharge.  Patient should continue medications as prescribed.  Patient should follow a heart healthy diet.   Discharge Diagnoses:  Acute on chronic respiratory failure with hypoxia secondary to COPD Exacerbation and pneumonia Essential hypertension  Discharge Condition: Stable  Diet recommendation: heart healthy  Filed Weights  07/31/18 0231 07/31/18 0602 Weight: 85.3 kg 83.4 kg   History of present illness:  on 07/31/2018 by Dr. [name] a 60 y.o.malewith medical history significant ofCOPD. Patient has had 3 prior admits for COPD / PNA thus far this year, most recently in May.  Patient has been being treated for COPD exacerbation and BLL CAP as outpatient by [name] with levaquin and prednisone. Just finished levaquin yesterday. Prednisone remained at 46m daily with plans to initiate a slow taper.  Despite this, patient developed severe SOB and respiratory distress this evening. EMS called and patient noted to be satting 80% on RA. He is not on chronic O2  for his COPD.  Hospital Course:  Acute on chronic respiratory failure with hypoxia secondary to COPD Exacerbation and pneumonia -Patient noted to have oxygen saturations in the 80s on room air -Chest x-ray noted for pneumonia -Was recently placed on Levaquin and steroids by PCP approximately 1 week ago -On admission, patient continued to have wheezing well into his hospital stay day 1.  Patient currently still has some wheezing however has improved. -Was placed on Solu-Medrol, along with nebulizer treatments, azithromycin and ceftriaxone -Patient did require oxygen upon admission however has been able to ambulate and maintain oxygen saturations in the high 90s on room air. -Will discharge patient with prednisone taper, azithromycin and Ceftin. -Given that patient had oxygen saturations in the 80s on admission, it was thought that he would require several days in the hospital. Patient actually improved quicker than expected.  Essential hypertension -Continue losartan  Since discharge the patient is tried to go back to work but has extreme difficulty in the heat with this.  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  10/05/2018 This patient's not been seen since the end of July he had failed several follow-up visits.  He did see Dr. WShyrl Numbersend of August who did not make  major changes in his medication program.  He is to remain on Dulera and Incruse.  The patient after being admitted in July got readmitted early August and then again in early September.  Discharge summaries are as noted below.  During the August admission the patient underwent bronchoscopy and had mucous plugging  removed the culture showed normal flora Note this patient has not been actively smoking recently Admit date: 08/09/2018 Discharge date: 08/11/2018  Discharge Diagnoses:  Acute on chronic hypoxic respiratory failure/sepsis due to multifocal pneumonia COPD Alcohol/marijuana use Essential hypertension  History of present illness:  on 08/09/2018 by Dr. Jennifer Yates James Robertsonis a 60 y.o.malewith medical history significant ofCOPD and ETOH dependence presenting with respiratory distress.He woke up about 530 this AM and couldn't breathe. This just keeps happening. He felt similarly when hospitalized last week. He has been doing fine until today. No h/o OSA, not on CPAP. Not on home O2. +cough, productive of yellowish sputum. It is sometimes hard to get the sputum up. No fever. +wheezing since his last hospitalization. Quit smoking in 2014. He lives in a non-air-conditioned log cabin.  He was hospitalized from 7/27-28 for acute on chronic respiratory failure associated with COPD exacerbation and PNA. This was his 4th admission this year. He was seen by Dr. Nechemia Chiappetta on 7/30 with plan for completion of course of Keflex and prednisone, as well as prednisone taper.  Hospital Course:  Acute on chronic hypoxic respiratory failure/sepsis due to multifocal pneumonia -Was recently hospitalized in late July for pneumonia and COPD exacerbation. Was discharged with antibiotics as well as prednisone taper. Patient followed up with Dr. Zaid Tomes on 08/03/2018 with completion of Keflex and prednisone as well. -This is his fourth admission this year for similar presentation -Patient presented with cough and shortness of breath -Chest x-rayon admissionfindings consistent with right-sided multifocal pneumonia -CTA chest showed multifocal pneumonia on the right. COPD including emphysema. Negative for PE. -COVID negative -Upon admission, patient had leukocytosis, fever, tachycardia, tachypnea-  all resolving  -Noted to be hypoxic 88% on room air in the ED -Urine strep pneumoniaand legionellaantigensnegative -Sputum culture: Few GPC, GVR, rare yeast -Blood cultures show no growth -was placed on vancomycin, Zosyn, Solu-Medrol, supplemental oxygen -S/p bronchoscopy 8/6, pending culture results -CXR obtained after bronch on 8/6: almost complete clearing of the hazy infiltrates in the right lung -Discussed with Dr. Smith (pulm), recommended prednisone taper starting with prednisone 40mg daily x 1 week, taper as an outpatient, along with Augmentin. He will follow up with the patient in one week. Given that infection cleared on xray so quickly, question if this is inflammatory vs infection.  -patient able to ambulate today without oxygen and maintained oxygen saturations in the mid 90s- therefore patient does not qualify for home oxygen -patient also had a swallow eval- mild aspiration risk  COPD -Continue nebulizer treatments, steroids, inhalers -Incentive spirometry, flutter valve  Alcohol/marijuana use -Discussed cessation -was placed on continue CIWA protocol  Essential hypertension -Continue Cozaar  Consultants PCCM- pulmonology   Procedures Bronchoscopy   Recommendations for Outpatient Follow-up:  1. Follow up with PCP in 1-2 weeks 2. Please obtain BMP/CBC in one week your next doctors visit.  3. Augmentin and azithromycin for 5 days 4. Advised to continue using bronchodilators every 4-6 hours for the next 4-5 days and then transition to as needed. 5. Advised to take his home prednisone, for next 3 days advised to increase it to 40 mg twice daily then resume back is 20 mg daily    Discharge Condition: Stable CODE STATUS: Full code Diet recommendation: 2 g salt   DC summary from 09/11/18  Adm 9/7  D/C 09/13/18 Brief/Interim Summary: James Robertsonis a 60 y.o.malewith history ofCOPD on prednisone, HTN, alcohol use disorder, marijuana use,tobacco user.  Patient presented secondary to dyspnea and found to have evidence of pneumonia and a COPD exacerbation in addition to sepsis physiology on admission.  There is concerns of possible right lower lobe pneumonia therefore started on Unasyn and azithromycin due to concerns of aspiration.  This was transitioned to Augmentin and azithromycin for total of 5 days at time of discharge.  Secondary to infection, he was having COPD exacerbation therefore on bronchodilators.  He started improving and on the day of discharge he was adamant about going home as he was feeling much better.  He was still having abnormal breath sounds and cough therefore I had requested him to stay another day in the hospital but still wanted to go home.  Instructions were given to him take 40 mg of prednisone for next 3 days and then he can resume back his 20 mg daily.  Advised... home bronchodilators periodically every 4-6 hours for next 4-5 days and then transition to as needed. Advised to follow-up with his PCP in about 1 week.   Discharge Diagnoses:  Active Problems:   Hypertension   COPD exacerbation (HCC)   Marijuana abuse   Pneumonia   Sepsis (HCC)  Acute respiratory distress secondary to right lower lobe pneumonia, community-acquired/aspiration Acute mild exacerbation of COPD Concern for possible aspiration.  Unasyn and azithromycin transition to Augmentin and azithromycin.  We will give him 5 more day course at the time of discharge 40 mg of prednisone for next 3 days then transition to 20 mg daily which is his home dose Advised to use bronchodilators.3  Alcohol use disorder Counseled to quit drinking alcohol.  Advised to continue thiamine, folate and multivitamin  Weakness -He did well with physical therapy.  Tobacco use Marijuana use Cessation discussed on admission  Consultations:  None  Subjective: Still having some cough and congestion but he is adamant about going home today as he is feeling much  better than from admission.  I recommended he stay in the hospital for at least 1 more day to help him feel even better.  He wished to go home. Ambulating in the hallway without additional saturating greater than 90% on room air.   Since discharge this patient went to urgent care on 21 September with an outbreak of shingles over his left lower back.  He is taken a course of valacyclovir and prednisone.  He has severe pain.  He was given gabapentin but this caused hallucinations and then we tried tramadol but this caused severe nausea and vomiting.  Apparently the patient cannot take Tylenol or nonsteroidal anti-inflammatories as well.  The patient states his breathing is been labored.  He still has productive cough of white mucus.  He has no fever.  He has significant back pain because of the shingles. The patient is maintaining the Dulera and Incruse as well as his antihypertensive medications and vitamins     10/12/2018  This patient is seen in return follow-up for acute shingles with postherpetic neuropathy, recent pneumonia, COPD exacerbation, and hypertension.  Patient is markedly improved from the last visit.  He has less cough less shortness of breath.  He has improved pain around the area of the shingles.  He is taking amitriptyline 25 mg at bedtime  The   patient notes less pain.  He does state he had been using the tramadol without nausea or vomiting.  I asked if patient would be interested in the para medicine program and expressed interest in the community para medicine program  11/23/2018 Patient seen in return follow-up and has noted increasing shortness of breath and we had to resume prednisone this patient.  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  He does maintain his inhalers.  He is drinking 4 beers a night.  He is not smoking.  Note the patient continues to live in a home that has standing water under the foundation and in several the rooms and mold on the carpet.  We have  connected this patient with legal aid to see if they can assist him in getting into better housing.  The patient's not had previous allergy testing   12/13/2018 The patient notes increased shortness of breath, wheezing and cough productive of thick yellow mucus.  He is using the flutter valve but is not being is productive with this.  He denies chest pain or fever.  He is down to 2 a day on the prednisone.  He is maintaining his inhalers. Apparently there is repair is being done on the patient's home and he is decided to stay in the home  02/27/2019 This patient is seen in return follow-up for COPD.  The patient has significant mold exposure in the home.  There has been some mitigation of this but is not complete.  The patient just was in the urgent care for COPD exacerbation a week ago treated with a course of antibiotics and prednisone.  He is now off prednisone. Patient states the pain in the back has resolved however he is still having increased shortness of breath.  He still having productive cough of thick yellow mucus.  05/01/2019  Since the last OV the patient has been admitted for Copd flare again. Below is the discharge summary Dc summary:  Admit Date: 04/23/2019 Discharge date: 04/27/2019  Recommendations for Outpatient Follow-up:  1. Follow up with PCP in 1-2 weeks 2. Please obtain CMP/CBC in one week 3. Please repeat chest x-ray in the next 6 to 8 weeks to document resolution of infiltrates, if not may require further work-up.     Brief Narrative: Patient is a77 y.o.malewith history of COPD, intellectual disability, HTN who presented with shortness of breath-found to have acute hypoxic respiratory failure secondary to pneumonia and COPD exacerbation. Patient initially required BiPAP on admission. See below for further details.  Significant events: 4/19>> admit to [facility] for severe hypoxemia requiring BiPAP 4/20>> liberated from BiPAP  Antimicrobial  therapy: Rocephin 4/18>>4/23 Zithromax 4/18>>4/22  Microbiology data: Blood culture: 4/19>> negative  Procedures : None  Consults: None  Brief Hospital Course: Acute hypoxic/hypercarbic respiratory failure secondary to COPD exacerbation and pneumonia:Improved-initially required BiPAP on admission-titrated off BiPAP on 4/20.Now on room air with stable O2 saturations.  Feels much better-treated with Rocephin and Zithromax-we will transition to [facility] for a few more days on discharge.  He will resume his usual inhaler regimen-he will be placed on 20 mg of p.o. prednisone until he is seen by [name].  PCP to repeat a chest x-ray in 6 to 8 weeks to document resolution of infiltrates-if not he may require further work-up to rule out underlying malignancy.  (Per patient's sister-patient probably has been taking more prednisone than he should be-he is unable to read-and is not able to follow directions)  HTN: BPstable-continue losartan  EtOH abuse:No signs  of withdrawal-managed with  Ativan per protocol. Per patient's sister-he drinks around 2-3 bottles of beer on a daily basis.  Intellectual disability:Spoke at length with patient's sister ([name]) today-patient isa illiterate-and has been living on his own for all his life. His other sister lives a mileaway and checks on him daily. Sister acknowledges poor living conditions-but apparently this is the way that patient has lived all his life-and chooses to live this way. Patient does odd jobs and sustainsa livelihood. Patient is currently managing all his finances. [name] does acknowledge that patient will not let any home health services come to his house-he will not go to SNF. Difficult situation-but even though patient is an illiterate/intellectual disability-he understand that he is somewhat deconditioned from his acute illness-and is in need of some help-he does not want therapy services-he understands the risk of falls from  being weak.  This MD had numerous discussions with him regarding his appropriate disposition-all he wants to do is go home-his sister-[name] wants to have him come to her place in [place], [place] for a few weeks-however the patient really does not want to go there-and wants to go home.  I have discussed at length with social worker as well-at this point he is being discharged home at his own request.  His family will keep an eye on him.   Nutrition Problem: Nutrition Problem: Increased nutrient needs Etiology: chronic illness(COPD) Signs/Symptoms: estimated needs Interventions: Ensure Enlive (each supplement provides 350kcal and 20 grams of protein)  Discharge Diagnoses:  Principal Problem:   Acute on chronic respiratory failure with hypoxia and hypercapnia (HCC) Active Problems:   Hypertension   COPD exacerbation (HCC)   Community acquired pneumonia of right lower lobe of lung   Marijuana abuse   Intellectual disability  Since discharge the patient has completed his antibiotics for his right lower lobe pneumonia.  He is on tapering prednisone.  He has developed a rash on his arms and face.  The rash is pruritic in nature.  He has been maintaining his medications as prescribed.   07/23/2019 Since the last visit this patient was readmitted to the hospital between 30 June and 3 July.  Below is a copy of the discharge summary Discharge Diagnosis: 1. Acute hypoxic respiratory 2/2 COPD exacerbation vs pneumonitis 2. GERD 3. EtOH history 4. HTN  Discharge Medications: Allergies as of 07/07/2019     Reactions  Gabapentin Other (See Comments)  hallucinations  Disposition and follow-up:   [name] was discharged from [facility] in Stable condition.  At the hospital follow up visit please address:  1.  COPD exacerbation: Continue daily Albuterol, Dulera, Spiriva, and prednisone 40 mg daily. Sent home with 1 additional day of azithromycin.  Discharged on 2L [place]. F/u allergy labs.  GERD: Severe reflux noted on esophagram, started on pantoprazole daily, may need GI follow up.   2.  Labs / imaging needed at time of follow-up: None  3.  Pending labs/ test needing follow-up: IgE, allergen panel for mold, hypersensitivity pneumonitis  Note since discharge the patient's breathing is back to baseline.  The patient does have some wheezing and minimal productive cough.  Noticed blood pressures ranged up at this visit.  He is maintaining Dulera Spiriva as before he has been maintained on prednisone 40 mg daily and also discharged on 2 L oxygen.  He no longer is actively working at this time.  Note his mold assay did show a mild hypersensitivity to some yeast.  08/23/2019   Patient returns today in follow-up showing increased shortness of breath.  He continues to work outdoors in the heat and this is exacerbating his condition despite being on 20 mg of prednisone daily.  He notes thick yellow mucus that he is coughing up.  He notes increased wheeze.  He has ran out of his Spiriva        10/02/2019 This patient returns after having been in the hospital again for 4 days with discharge summary as noted below. Patient returns today with continued shortness of breath and productive cough. Mucus is still thick and yellow and difficult to raise. He has chest tightness. He still drinking 6-7 beers daily. He is no longer smoking. He is now on oxygen 4 L continuous. He has had his Medicaid approved and is now fully declared disabled. 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 The landlord has a complex relationship with this patient. Patient lives rent free and a home on the landlord's property. The home is not in good repair as documented previously.   Date of Admission: 09/22/2019  3:42 AM Date of Discharge: 09/25/2019 Attending Physician: Lenice Pressman, MD PhD  Discharge Diagnosis: 1. COPD exacerbation, underlying  bronchiectasis 2. Alcohol use disorder 3. Skin rash    Disposition and follow-up:   [name] was discharged from [facility] in Stable condition.  At the hospital follow up visit please address:  1.  Follow up      A. COPD exacerbation, recurrent pneumonias, underlying bronchiectasis - hx of recurrent pneumonia, see hospital course for details on prior workup, follow up with his pulmonologist      B. Alcohol use disorder - had mild withdrawal symptoms, motivated to quit, consider pharmacologic assistance such as naltrexone      C. Skin rash - not improving with triamcinolone, referred to dermatology  2.  Labs / imaging needed at time of follow-up: skin biopsy  3.  Pending labs/ test needing follow-up: none  Follow-up Appointments:  Hospital Course by problem list:  COPDexacerbation Recurrent pneumonia Bronchiectasis on bronchoscopy Presents with increased SOB, but cough baseline and not productive. He has had similar symptoms many times previously. PFTs in April 2021 with FEV1/FVC which was 54.01% of expected. CXR this admission with vague R lower lung field opacity, same location as opacity on prior CXRs. Has had recurrent pneumonia as far back as 2015. bronchoscopy 08/10/2018 for recurrent PNU demonstrating severe chronic bronchitis and bronchiectasis,neutrophil predominance and transbronchial biopsies with benign lung tissue with inflammation. CT on 07/05/19 with no ILD, no centrally obstructing lesion. Esophagram on 07/06/19 with no stenosis but severe reflux which could be contributing to COPD. Other pulm workup significant for IgE WNL, allergen profile for mold equivocal for Candida albicans but negative for all others, negative hypersensitivity pneumonitis panel. Has very good outpatient follow up with pulmonology. Is on Dulera, Spiriva, and albuterol and also prednisone 47m daily, states he has been compliant with all these, though at one point earlier  this admission denied steroid use. Treated with ceftriaxone, azithromycin, and prednisone initially with rapid improvement in SOB. No fevers this admission, cough remains unproductive so did not culture. Discharged on his home Dulera, Spiriva, albuterol, Prednisone. Discharged with oral azithromycin to complete 5 day course.   Alcohol Use Disorder Reports using 2x 24oz beers per day, which is decreased from prior. No hx of withdrawal seizures. CIWA scores max of 15 indicating mild withdrawal, requiring relatively low  amounts of Ativan. On day of discharge had scores of up to 5. Patient is motivated to quit drinking because "I want to live longer." Discharged with PO thiamine.  Skin rash Reports a rash for the past several months on his abdomen and both arms. His PCP has been treating this with triamcinolone, but this has not really helped. Referral provided to dermatology for biopsy/further assessment.  Note the patient's rash on both forearms is still persistent and triamcinolone has not helped  This patient will need a dermatology referral as per discharge summary. Note the patient is still actively drinking alcohol. See shortness of breath assessment below  11/06/2019 This patient is seen by way of a telephone visit and post hospital.  He was just admitted again for recurrent pneumonia this time with Covid infection.  This patient was intubated was severely ill.  He is now on oxygen 4 L continuous and 40 mg daily of prednisone.  He also has a allergic dermatitis rash.  This has not been responsive to steroids and is quite pruritic despite hydroxyzine when seen recently at urgent care.  I went over with this patient at this visit the need to receive assisted care but he refuses and wishes to stay in his current living arrangement which has very poor environment  Below is a copy of the recent discharge summary  Admission date:  10/19/2019  Admitting Physician  No admitting provider for  patient encounter.  Discharge Date:  10/26/2019   Primary MD  Arad Burston E, MD  Recommendations for primary care physician for things to follow:  -Patient instructed to follow with PCP/pulmonary as an outpatient, appointment will be rescheduled till he is off quarantine as discussed with the patient and staff. -Please check CBC, CMP during next visit.  Admission Diagnosis  COPD exacerbation (HCC) (J44.1) Acute on chronic respiratory failure with hypoxia (HCC) (J96.21) Pneumonia due to COVID-19 virus (U07.1, J12.82)   Discharge Diagnosis  COPD exacerbation (HCC) (J44.1) Acute on chronic respiratory failure with hypoxia (HCC) (J96.21) Pneumonia due to COVID-19 virus (U07.1, J12.82)    Active Problems:   Acute on chronic respiratory failure with hypoxia (HCC)   Pneumonia due to COVID-19 virus      Past Medical History: Diagnosis Date . Acute on chronic respiratory failure with hypoxia (HCC) 06/08/2013 . CAP (community acquired pneumonia) 05/17/2018  See admit 05/17/18 ? rml  with   covid pcr neg - rx augmentin > f/u cxr in 4-6 weeks is fine unless condition declines  . Community acquired pneumonia of right lower lobe of lung 05/17/2018  See admit 05/17/18 ? rml  with   covid pcr neg - rx augmentin > f/u cxr in 4-6 weeks is fine unless condition declines  . COPD (chronic obstructive pulmonary disease) (HCC)   not on home . Diabetes (HCC)  . Dyspnea  . Hypertension  . Pneumonia 04/06/2016 . Pneumothorax   2016, fell from ladder . 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 Tick bite 06/03/2018   Past Surgical History: Procedure Laterality Date . VIDEO ASSISTED THORACOSCOPY (VATS)/THOROCOTOMY Left 06/11/2013  Procedure: VIDEO ASSISTED THORACOSCOPY (VATS)/THOROCOTOMY;  Surgeon: Peter Van Trigt, MD;  Location: [facility] OR;  Service: Thoracic;  Laterality: Left; . VIDEO BRONCHOSCOPY N/A 06/11/2013  Procedure: VIDEO BRONCHOSCOPY;  Surgeon: Peter Van Trigt, MD;  Location: [facility]  OR;  Service: Thoracic;  Laterality: N/A; . VIDEO BRONCHOSCOPY N/A 08/10/2018  Procedure: VIDEO BRONCHOSCOPY WITH FLUORO;  Surgeon: Smith, Daniel C, MD;  Location: [facility] ENDOSCOPY;  Service: Endoscopy;  Laterality: N/A;        History of present illness and  Hospital Course:     Kindly see H&P for history of present illness and admission details, please review complete Labs, Consult reports and Test reports for all details in brief  HPI  from the history and physical done on the day of admission by PCCM on 10/19/2019  Pt is encephelopathic; therefore, this HPI is obtained from chart review. James Robertsonis a 59 y.o.malewho has a PMH as outlined below.Hepresented to [facility] 10/15 with significant dyspnea to the point that he could not speak in full sentences. He was placed on CPAP but did not respond; therefore, he was intubated.  After intubation, COVID returned positive.  He has been evaluated by pulmonary on prior admissions given recurrent PNA. He has bronchoscopy in Aug 2020 which showed severe bronchiectasis bilaterally, neutrophil predominance and transbronchial biopsies were benign.   Hospital Course   James Robertsonis a 59 y.o.malewho has a PMH of chronic respiratory failure, COPD, diabetes, hypertension, presented to [facility] ED 10/15 with significant dyspnea to the point that he could not speak in full sentences. He was placed on CPAP but did not respond; therefore, he was intubated.After intubation, COVID returned positive. He has been evaluated by pulmonary on prior admissions given recurrent PNA. He has bronchoscopy in Aug 2020 which showed severe bronchiectasis bilaterally, neutrophil predominance and transbronchial biopsies were benign. Patient was successfully extubated 10/17, and he was transferred to [facility] 10/19.   Acute on chronic hypoxic Resp. Failure due to Acute Covid 19 Viral Pneumonitis during the ongoing 2020 Covid 19 Pandemic   -Unvaccinated, he is a critically ill on presentation required intubation.  He was successfully extubated after 48 hours, with increased oxygen requirement initially, he is currently back at his baseline oxygen requirement, report requiring 2 L at rest, 4 L with activity. -He is extubated 10/17. -Treated initially with IV steroids, he will be discharged on home dose prednisone 20 mg oral daily (this was confirmed with with the patient that he is at current dose, he was given a refill for next 30 days till he is seen by PCP) -He was treated with remdesivir during hospital stay, as well he was treated with baricitinib as well -Was encouraged to take his incentive spirometry and flutter valve at home and continue using them .   COPD exacerbation -Treated with antibiotics for 5 days, continue with bronchodilators, continue with steroid  .  Hypertension -Continue with home dose metoprolol, and Cozaar  Diabetes mellitus -We will hold home dose Metformin, will keep on sliding scale during hospital stay, A1c is 6.8  History of alcohol abuse -No evidence of withdrawal during hospital stay, continue with thiamine and folic acid on discharge  Discharge Condition:  stable   Patient states his breathing is back at baseline he states he is no longer drinking alcohol he states his blood sugars have been in the 100 range he complains of his left hand feeling numb and is not able to sleep at night due to agitation   11/13/19 This patient was seen in return follow-up after had been seen by way of a telephone visit a week ago.  He was just hospitalized note discharge summary is as above.  He had a Covid pneumonia and respiratory failure was on mechanical ventilator.  Today he comes in on 4 L oxygen nighttime only been on room air today is 95%.  He has advanced COPD.  He had significant alcohol abuse he states he is no longer   drinking alcohol.  He still complains of bilateral rashes on the arms and  the umbilical area.  See shortness of breath assessment below  11/19/2019 This patient is seen again in short-term follow-up after having just been seen last week.  He came to the emergency room on 12 November in acute distress.  He was given high-dose IV steroids intensive neb treatments and discharged.  The patient states since taking the Diflucan and triamcinolone cream his rash is improved on his arms.  He states his breathing is getting back to baseline.  He still using oxygen 4 L at bedtime.  He still has thick tenacious mucus with thick yellow mucus.  He is off all antibiotics.  He is on prednisone 40 mg daily.  The patient continues to adamantly refuse leaving his current housing.  He is not regularly checking his blood sugar at home.  See assessment below  12/03/2019 Short term f/u COPD   No smoking since 2014.  Still in current housing. No ETOH use  The patient today states his breathing is somewhat improved he has no cough he is off oxygen.  He does complain of right-sided wrist pain he cannot recall if he injured the wrist.  This is been going on for 1 week.  Patient states he has some pruritus but his rash is overall improved.  He is no longer drinking alcohol.  He is not smoking.  He is yet to receive a Covid vaccine.  He is due a flu vaccine at this visit.   02/13/2020 This patient is seen in return follow-up for COPD and bronchiectasis.  He states has not been drinking any alcohol in 3 months.  He still complains of a rash over his neck and forearms and has partially improved with steroid topically.  On arrival A1c is 6.3.  He does use oxygen 2 L at bedtime not during the day but as needed only.  He does have a portable oxygen concentrator  Patient's been compliant with his inhaled medications he still living the same housing as he had before  He has not been back to the emergency room or hospital since his last visit in November  Shortness of Breath This is a chronic  problem. The current episode started more than 1 year ago. The problem occurs daily (qhs shortness of breath). The problem has been unchanged. Associated symptoms include wheezing. Pertinent negatives include no chest pain, claudication, fever, headaches, hemoptysis, leg pain, leg swelling, orthopnea, PND, rash, sore throat, sputum production, syncope or vomiting. The symptoms are aggravated by any activity, lying flat and occupational exposure. Associated symptoms comments: gerd  . Risk factors include smoking. He has tried beta agonist inhalers, oral steroids and steroid inhalers for the symptoms. The treatment provided moderate relief. His past medical history is significant for COPD and pneumonia. There is no history of asthma, CAD, DVT, a heart failure or PE.    Past Medical History:  Diagnosis Date  . Acute on chronic respiratory failure with hypoxia (HCC) 06/08/2013  . CAP (community acquired pneumonia) 05/17/2018   See admit 05/17/18 ? rml  with   covid pcr neg - rx augmentin > f/u cxr in 4-6 weeks is fine unless condition declines   . Community acquired pneumonia of right lower lobe of lung 05/17/2018   See admit 05/17/18 ? rml  with   covid pcr neg - rx augmentin > f/u cxr in 4-6 weeks is fine unless condition declines   . COPD (chronic obstructive pulmonary  disease) ([place])    not on home  . Diabetes ([place])   . Dyspnea   . Hypertension   . Pneumonia 04/06/2016  . Pneumonia due to COVID-19 virus 10/19/2019  . Pneumothorax    2016, fell from ladder  . 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 Tick bite 06/03/2018     Family History  Problem Relation Age of Onset  . Heart disease Father   . COPD Sister      Social History   Socioeconomic History  . Marital status: Divorced    Spouse name: Not on file  . Number of children: Not on file  . Years of education: Not on file  . Highest education level: Not on file  Occupational History  . Occupation: Dealer  . Occupation: Painter   Tobacco Use  . Smoking status: Former Smoker    Packs/day: 1.50    Years: 40.00    Pack years: 60.00    Types: Cigarettes    Quit date: 2014    Years since quitting: 8.1  . Smokeless tobacco: Current User    Types: Chew  Vaping Use  . Vaping Use: Never used  Substance and Sexual Activity  . Alcohol use: Yes    Alcohol/week: 28.0 standard drinks    Types: 28 Cans of beer per week    Comment: 4 beers a night; + jitters with not drinking, denies DTs or seizures  . Drug use: Yes    Types: Marijuana    Comment: daily; quit using crack and methamphetamine about 5 months ago   . Sexual activity: Not Currently    Partners: Female  Other Topics Concern  . Not on file  Social History Narrative   Lives alone near [place] Determinants of Health   Financial Resource Strain: Not on file  Food Insecurity: Not on file  Transportation Needs: Not on file  Physical Activity: Not on file  Stress: Not on file  Social Connections: Not on file  Intimate Partner Violence: Not on file     Allergies  Allergen Reactions  . Gabapentin Other (See Comments)    hallucinations     Outpatient Medications Prior to Visit  Medication Sig Dispense Refill  . albuterol (VENTOLIN HFA) 108 (90 Base) MCG/ACT inhaler Inhale 2 puffs into the lungs every 4 (four) hours as needed for wheezing or shortness of breath. 1 each 0  . budesonide-formoterol (SYMBICORT) 160-4.5 MCG/ACT inhaler Inhale 2 puffs into the lungs 2 (two) times daily. 1 each 12  . furosemide (LASIX) 20 MG tablet Take 1 tablet (20 mg total) by mouth daily as needed for edema (lower extremity). 30 tablet 3  . losartan (COZAAR) 100 MG tablet Take 1 tablet (100 mg total) by mouth daily. 90 tablet 2  . metFORMIN (GLUCOPHAGE) 500 MG tablet Take 2 tablets (1,000 mg total) by mouth 2 (two) times daily with a meal. 240 tablet 0  . metoprolol succinate (TOPROL-XL) 25 MG 24 hr tablet Take 1 tablet (25 mg total) by mouth daily. 90 tablet 3   . pantoprazole (PROTONIX) 40 MG tablet Take 1 tablet (40 mg total) by mouth daily. 30 tablet 6  . albuterol (PROVENTIL) (2.5 MG/3ML) 0.083% nebulizer solution Take 3 mLs (2.5 mg total) by nebulization in the morning, at noon, and at bedtime. 270 mL 1  . predniSONE (DELTASONE) 20 MG tablet Take 2 tablet daily for 7 days then 1 daily and STAY (Patient taking differently: 20 mg daily with breakfast.) 60 tablet 1  .  Tiotropium Bromide Monohydrate (SPIRIVA RESPIMAT) 2.5 MCG/ACT AERS Two puff daily (Patient taking differently: Inhale 2 puffs into the lungs daily.) 4 g 11  . Accu-Chek Softclix Lancets lancets Use as instructed to check blood sugar once daily. (Patient not taking: No sig reported) 100 each 2  . Blood Glucose Monitoring Suppl (ACCU-CHEK GUIDE ME) w/Device KIT 1 each by Other route in the morning and at bedtime.  (Patient not taking: No sig reported)    . Blood Glucose Monitoring Suppl (TRUE METRIX METER) w/Device KIT Use to measure blood sugar twice a day (Patient not taking: No sig reported) 1 kit 0  . glucose blood (TRUE METRIX BLOOD GLUCOSE TEST) test strip Use as instructed (Patient not taking: No sig reported) 100 each 12  . Respiratory Therapy Supplies (FLUTTER) DEVI Use 4 times daily (Patient taking differently: 1 each by Other route See admin instructions. Use 4 times daily) 1 each 0  . folic acid (FOLVITE) 1 MG tablet Take 1 tablet (1 mg total) by mouth daily. (Patient not taking: Reported on 02/13/2020) 90 tablet 0  . hydrOXYzine (ATARAX/VISTARIL) 25 MG tablet Take 1 tablet (25 mg total) by mouth every 8 (eight) hours as needed for anxiety or itching. (Patient not taking: No sig reported) 30 tablet 0  . multivitamin-iron-minerals-folic acid (CENTRUM) chewable tablet Chew 1 tablet by mouth daily. (Patient not taking: No sig reported) 90 tablet 2  . thiamine (VITAMIN B-1) 100 MG tablet Take 1 tablet (100 mg total) by mouth daily. (Patient not taking: No sig reported) 30 tablet 3  .  triamcinolone cream (KENALOG) 0.1 % Apply 1 application topically 2 (two) times daily. (Patient not taking: No sig reported) 30 g 0   No facility-administered medications prior to visit.     Review of Systems  Constitutional: Negative for fever.  HENT: Negative for sinus pressure, sinus pain, sore throat and trouble swallowing.   Eyes: Negative.   Respiratory: Positive for cough, shortness of breath and wheezing. Negative for hemoptysis, sputum production and chest tightness.   Cardiovascular: Negative for chest pain, palpitations, orthopnea, claudication, leg swelling, syncope and PND.  Gastrointestinal: Negative for nausea and vomiting.  Genitourinary: Negative.   Musculoskeletal: Positive for back pain.  Skin: Negative for rash.  Neurological: Negative for headaches.  Psychiatric/Behavioral: Negative for hallucinations. The patient is not nervous/anxious and is not hyperactive.        Objective:   Physical Exam Vitals:   02/13/20 0859  BP: 136/78  Pulse: 91  Resp: (!) 24  SpO2: 96%  Weight: 202 lb (91.6 kg)   Gen: Pleasant, well-nourished, in no distress,  normal affect  ENT: No lesions,  mouth clear,  oropharynx clear, no postnasal drip, edentulous  Neck: No JVD, no TMG, no carotid bruits  Lungs: No use of accessory muscles, no dullness to percussion, distant breath sounds without wheezes  Cardiovascular: RRR, heart sounds normal, no murmur or gallops, no peripheral edema  Abdomen: soft and NT, no HSM,  BS normal  Musculoskeletal: Edema and tenderness in the lateral aspect of the right forearm with slight defect, no cyanosis or clubbing  Neuro: alert, non focal  Skin: Warm, no lesions , erythematous rash both forearms and umbilical area is improved   Disability eval at [facility]:   Pulmonary Function Test (04/19/2019 1:42 PM EDT) Pulmonary Function Test (04/19/2019 1:42 PM EDT)  Component Value Ref Range Performed At Pathologist Signature  FVC Post 2.51 L  [facility]   FEV1 Post 1.55  L [facility]   FEV1/FVC Post 61.87 % [facility]   FEF25-75% Post 0.90 L/s [facility]   PEF Post 3.42 L/s [facility]   MVV Post 42.00 L/min [facility]   FVC Pre 2.92 L [facility]   FEV1 Pre 1.57 L [facility]   FEV1/FVC Pre 54.01 % [facility]   FEF25-75% Pre 0.68 L/s [facility]   PEF Pre 4.14 L/s [facility]   MVV Pre 45.00 L/min [facility]   FVC_LLN 3.73  [facility]   FVC_Z-SCORE -2.84  [facility]   FVC_%PRED 60 % % [facility]   FVC_Z-SCORE -2.84  [facility]   FVC_%PRED 52 % % [facility]   FVC %Chng -14 % % [facility]   FEV1_LLN 2.86  [facility]   FEV1_Z-SCORE -3.78  [facility]   FEV1_%PRED 42 % % [facility]   FEV1_Z-SCORE -3.78  [facility]   FEV1_%PRED 41 % % [facility]   FEV1 %Chng -2 % % [facility]   FEV1/FVC_LLN 66  [facility]   FEV1/FVC %Chng 15 % % [facility]   FEF25-75%_LLN 1.60  [facility]   FEF25-75%_Z-SCORE -3.05  [facility]   FEF25-75%_%PRED 21 % % [facility]   FEF25-75%_Z-SCORE -3.05  [facility]   FEF25-75%_%PRED 28 % % [facility]   FEF25-75% %Chng 33 % % [facility]   PEF_LLN 7.25  [facility]   PEF_%PRED 43 % 36 % % [facility]   PEF %Chng -17 % % [facility]   MVV_LLN 130  [facility]   MVV_%PRED 35 % 32 % % [facility]   MVV %Chng -7 % %  [facility]     Media Information    Document Information  Photos    11/02/2018 14:43  Attached To:  [name]  Source Information  [name], MD  [facility]   Pertinent Labs, Studies, and Procedures:  DG Chest Portable 1 View  Result Date: 09/22/2019 CLINICAL DATA:  Dyspnea. COPD exacerbation. Chest pain and shortness of breath. EXAM: PORTABLE CHEST 1 VIEW COMPARISON:  Cyst 07/05/2019 FINDINGS: Stable cardiomediastinal contours. Chronic blunting of the left costophrenic angle due to pleuroparenchymal scarring. Changes of emphysema noted with asymmetric increase hazy opacity within the right lung base concerning for pneumonia. Numerous remote bilateral rib fracture deformities. IMPRESSION: 1. Increased hazy opacity within the right lung base concerning for pneumonia. 2. Emphysema. Electronically Signed   By: Kerby Moors M.D.   On: 09/22/2019 04:20     BMP Latest Ref Rng & Units 11/16/2019 10/24/2019 10/23/2019  Glucose 70 - 99 mg/dL 113(H) 130(H) 139(H)  BUN 6 - 20 mg/dL 15 31(H) 34(H)  Creatinine 0.61 - 1.24 mg/dL 0.98 0.73 0.66  BUN/Creat Ratio 9 - 20 - - -  Sodium 135 - 145 mmol/L 144 139 138  Potassium 3.5 - 5.1 mmol/L 4.0 4.6 4.6  Chloride 98 - 111 mmol/L 103 99 99  CO2 22 - 32 mmol/L $RemoveB'27 31 30  '[id number]$ Calcium 8.9 - 10.3 mg/dL 9.4 9.4  9.2   Hepatic Function Panel     Component Value Date/Time   PROT 5.0 (L) 10/20/2019 0050   PROT 5.9 (L) 07/23/2019 0900   ALBUMIN 2.8 (L) 10/20/2019 0050   ALBUMIN 4.0 07/23/2019 0900   AST 18 10/20/2019 0050   ALT 22 10/20/2019 0050   ALT 62 (H) 06/23/2016 1226   ALKPHOS 36 (L) 10/20/2019 0050   BILITOT 0.7 10/20/2019 0050   BILITOT <0.2 07/23/2019 0900   BILIDIR <0.1 10/20/2019 0050   BILIDIR 0.12 04/16/2016 1601   IBILI NOT CALCULATED 10/20/2019 0050   CBC Latest Ref Rng & Units 11/16/2019 10/24/2019 10/21/2019  WBC 4.0 - 10.5 K/uL 10.4 17.3(H) 23.8(H)  Hemoglobin 13.0 - 17.0 g/dL  13.4 13.9 13.0  Hematocrit 39.0 - 52.0 % 42.1 43.0 40.8  Platelets 150 - 400 K/uL 301 300 286       Assessment & Plan:  I personally reviewed all images and lab data in the [facility] system as well as any outside material available during this office visit and agree with the  radiology impressions.   COPD mixed type ([place]) COPD with associated chronic bronchiectasis chronic hypoxic respiratory failure  No change in medications at this time  Type 2 diabetes mellitus without complication, without long-term current use of insulin (HCC) Type 2 diabetes at goal A1c of 6.3  Other atopic dermatitis Continue topical steroid  Referral to dermatology was made  Pain of right forearm Probable neuropathy no change in medications   Diagnoses and all orders for this visit:  Type 2 diabetes mellitus without complication, without long-term current use of insulin (HCC) -     Glucose (CBG) -     HgB A1c  Colon cancer screening -     Fecal occult blood, imunochemical  Other atopic dermatitis -     Ambulatory referral to Dermatology  COPD mixed type ([place])  Pain of right forearm  Other orders -     triamcinolone (KENALOG) 0.1 %; Apply 1 application topically 2 (two) times daily. -     albuterol (PROVENTIL) (2.5 MG/3ML) 0.083% nebulizer solution; Take 3 mLs (2.5 mg total) by nebulization in the morning, at noon, and at bedtime. -     predniSONE (DELTASONE) 20 MG tablet; Take 1 tablet (20 mg total) by mouth daily with breakfast. -     Tiotropium Bromide Monohydrate (SPIRIVA RESPIMAT) 2.5 MCG/ACT AERS; Inhale 2 puffs into the lungs daily.    Patient is yet to receive a Covid vaccine he does not appear interested in following through on this

## 2020-02-13 NOTE — Assessment & Plan Note (Signed)
Continue topical steroid  Referral to dermatology was made

## 2020-02-13 NOTE — Assessment & Plan Note (Signed)
Type 2 diabetes at goal A1c of 6.3

## 2020-02-13 NOTE — Assessment & Plan Note (Signed)
Probable neuropathy no change in medications

## 2020-02-21 ENCOUNTER — Ambulatory Visit: Payer: Medicaid Other | Admitting: Physician Assistant

## 2020-02-29 ENCOUNTER — Other Ambulatory Visit: Payer: Self-pay | Admitting: Dermatology

## 2020-02-29 DIAGNOSIS — B354 Tinea corporis: Secondary | ICD-10-CM | POA: Diagnosis not present

## 2020-02-29 MED FILL — KETOCONAZOLE 2% CREAM: 2 | 30 days supply | Qty: 60 | Fill #0

## 2020-02-29 MED FILL — TERBINAFINE HCL 250 MG TABS: 250 | 21 days supply | Qty: 21 | Fill #0

## 2020-03-04 ENCOUNTER — Other Ambulatory Visit (HOSPITAL_COMMUNITY): Payer: Self-pay

## 2020-03-04 DIAGNOSIS — J9621 Acute and chronic respiratory failure with hypoxia: Secondary | ICD-10-CM | POA: Diagnosis not present

## 2020-03-04 DIAGNOSIS — J9601 Acute respiratory failure with hypoxia: Secondary | ICD-10-CM | POA: Diagnosis not present

## 2020-03-04 DIAGNOSIS — F79 Unspecified intellectual disabilities: Secondary | ICD-10-CM | POA: Diagnosis not present

## 2020-03-04 NOTE — Progress Notes (Signed)
Paramedicine Encounter    Patient ID: James [name], male    DOB: June 12, 1960, 60 y.o.   MRN: [id number]   Patient Care Team: James [name] (Pulmonary Disease) O'Neal, Cassie Freer, MD as PCP - Cardiology (Cardiology) James Montane, RN as Case Manager James [name], [facility] as Pharmacist (Pharmacist)  Patient Active Problem List   Diagnosis Date Noted  . Pain of right forearm 12/03/2019  . Bronchiectasis ([place]) 09/24/2019  . Type 2 diabetes mellitus without complication, without long-term current use of insulin ([place]) 07/24/2019  . Other atopic dermatitis 05/01/2019  . Chronic respiratory failure with hypoxia ([place]) 04/23/2019  . Intellectual disability 04/23/2019  . History of alcohol use 11/23/2018  . Hypertension 05/17/2016  . History of tobacco abuse 04/06/2016  . COPD mixed type ([place]) 03/26/2016    Current Outpatient Medications:  .  albuterol (PROVENTIL) (2.5 MG/3ML) 0.083% nebulizer solution, Take 3 mLs (2.5 mg total) by nebulization in the morning, at noon, and at bedtime., Disp: 270 mL, Rfl: 1 .  albuterol (VENTOLIN HFA) 108 (90 Base) MCG/ACT inhaler, Inhale 2 puffs into the lungs every 4 (four) hours as needed for wheezing or shortness of breath., Disp: 1 each, Rfl: 0 .  budesonide-formoterol (SYMBICORT) 160-4.5 MCG/ACT inhaler, Inhale 2 puffs into the lungs 2 (two) times daily., Disp: 1 each, Rfl: 12 .  losartan (COZAAR) 100 MG tablet, Take 1 tablet (100 mg total) by mouth daily., Disp: 90 tablet, Rfl: 2 .  metoprolol succinate (TOPROL-XL) 25 MG 24 hr tablet, Take 1 tablet (25 mg total) by mouth daily., Disp: 90 tablet, Rfl: 3 .  pantoprazole (PROTONIX) 40 MG tablet, Take 1 tablet (40 mg total) by mouth daily., Disp: 30 tablet, Rfl: 6 .  predniSONE (DELTASONE) 20 MG tablet, Take 1 tablet (20 mg total) by mouth daily with breakfast., Disp: 60 tablet, Rfl: 3 .  Respiratory Therapy Supplies (FLUTTER) DEVI, Use 4 times daily (Patient taking differently:  1 each by Other route See admin instructions. Use 4 times daily), Disp: 1 each, Rfl: 0 .  terbinafine (LAMISIL) 250 MG tablet, Take 250 mg by mouth daily., Disp: , Rfl:  .  Tiotropium Bromide Monohydrate (SPIRIVA RESPIMAT) 2.5 MCG/ACT AERS, Inhale 2 puffs into the lungs daily., Disp: 4 g, Rfl: 6 .  triamcinolone (KENALOG) 0.1 %, Apply 1 application topically 2 (two) times daily., Disp: 30 g, Rfl: 0 .  Accu-Chek Softclix Lancets lancets, Use as instructed to check blood sugar once daily. (Patient not taking: No sig reported), Disp: 100 each, Rfl: 2 .  Blood Glucose Monitoring Suppl (ACCU-CHEK GUIDE ME) w/Device KIT, 1 each by Other route in the morning and at bedtime.  (Patient not taking: No sig reported), Disp: , Rfl:  .  Blood Glucose Monitoring Suppl (TRUE METRIX METER) w/Device KIT, Use to measure blood sugar twice a day (Patient not taking: No sig reported), Disp: 1 kit, Rfl: 0 .  furosemide (LASIX) 20 MG tablet, Take 1 tablet (20 mg total) by mouth daily as needed for edema (lower extremity). (Patient not taking: Reported on 03/04/2020), Disp: 30 tablet, Rfl: 3 .  glucose blood (TRUE METRIX BLOOD GLUCOSE TEST) test strip, Use as instructed (Patient not taking: No sig reported), Disp: 100 each, Rfl: 12 .  metFORMIN (GLUCOPHAGE) 500 MG tablet, Take 2 tablets (1,000 mg total) by mouth 2 (two) times daily with a meal., Disp: 240 tablet, Rfl: 0 Allergies  Allergen Reactions  . Gabapentin Other (See Comments)  hallucinations      Social History   Socioeconomic History  . Marital status: Divorced    Spouse name: Not on file  . Number of children: Not on file  . Years of education: Not on file  . Highest education level: Not on file  Occupational History  . Occupation: Dealer  . Occupation: Painter  Tobacco Use  . Smoking status: Former Smoker    Packs/day: 1.50    Years: 40.00    Pack years: 60.00    Types: Cigarettes    Quit date: 2014    Years since quitting: 8.1  . Smokeless  tobacco: Current User    Types: Chew  Vaping Use  . Vaping Use: Never used  Substance and Sexual Activity  . Alcohol use: Yes    Alcohol/week: 28.0 standard drinks    Types: 28 Cans of beer per week    Comment: 4 beers a night; + jitters with not drinking, denies DTs or seizures  . Drug use: Yes    Types: Marijuana    Comment: daily; quit using crack and methamphetamine about 5 months ago   . Sexual activity: Not Currently    Partners: Female  Other Topics Concern  . Not on file  Social History Narrative   Lives alone near James [name] Determinants of Health   Financial Resource Strain: Not on file  Food Insecurity: Not on file  Transportation Needs: Not on file  Physical Activity: Not on file  Stress: Not on file  Social Connections: Not on file  Intimate Partner Violence: Not on file    Physical Exam      Future Appointments  Date Time Provider [place]  04/14/2020 10:00 AM James Stain, MD [facility] None    BP 130/84   Pulse 80   Resp (!) 22   SpO2 98%   Pt reports doing ok. Breathing the same. Has inhalers. Using 02 daytime and nighttime.  meds reviewed-he needs losartan refilled-he is going to get James [name] to call it in.   is not checking his CBG's at home. I have reviewed this with him many times and cognitively I dont think he can retain how to use it.    He has new med called terbinafine that was given to him for itching, he reports improvement with that.  He denies c/p, no dizziness.  Lung sounds wheezy to the left side upper and lower and to the right side slight wheeze to rt lower side. Clear upper right lobe.  He is still working about all day.i have advised him he needs to cut back tremendously but knowing him he will not do that, he will continue to work all day, every day as much as he can.   James [name], [facility] Paramedic  03/04/20

## 2020-03-06 MED FILL — LOSARTAN POTASSIUM 100 MG T: 100 | 90 days supply | Qty: 90 | Fill #0

## 2020-03-12 MED FILL — PANTOPRAZOLE SOD DR 40 MG T: 40 | 30 days supply | Qty: 30 | Fill #2

## 2020-03-12 MED FILL — predniSONE 20 MG TABS: 20 | 30 days supply | Qty: 30 | Fill #3

## 2020-03-12 MED FILL — TRIAMCINOLONE 0.1% CREAM: 0.1 | 15 days supply | Qty: 30 | Fill #0

## 2020-03-18 IMAGING — DX DG CHEST 2V
2 series · 2 of 2 positions shown · non-contrast
Comparison: 09/11/2018

CLINICAL DATA: Chest pain, shortness of breath, cough and
congestion for 1-2 weeks, recent pneumonia diagnosis, history COPD,
hypertension, diabetes mellitus, pneumothorax, former smoker

EXAM:
CHEST - 2 VIEW

[chest pa]
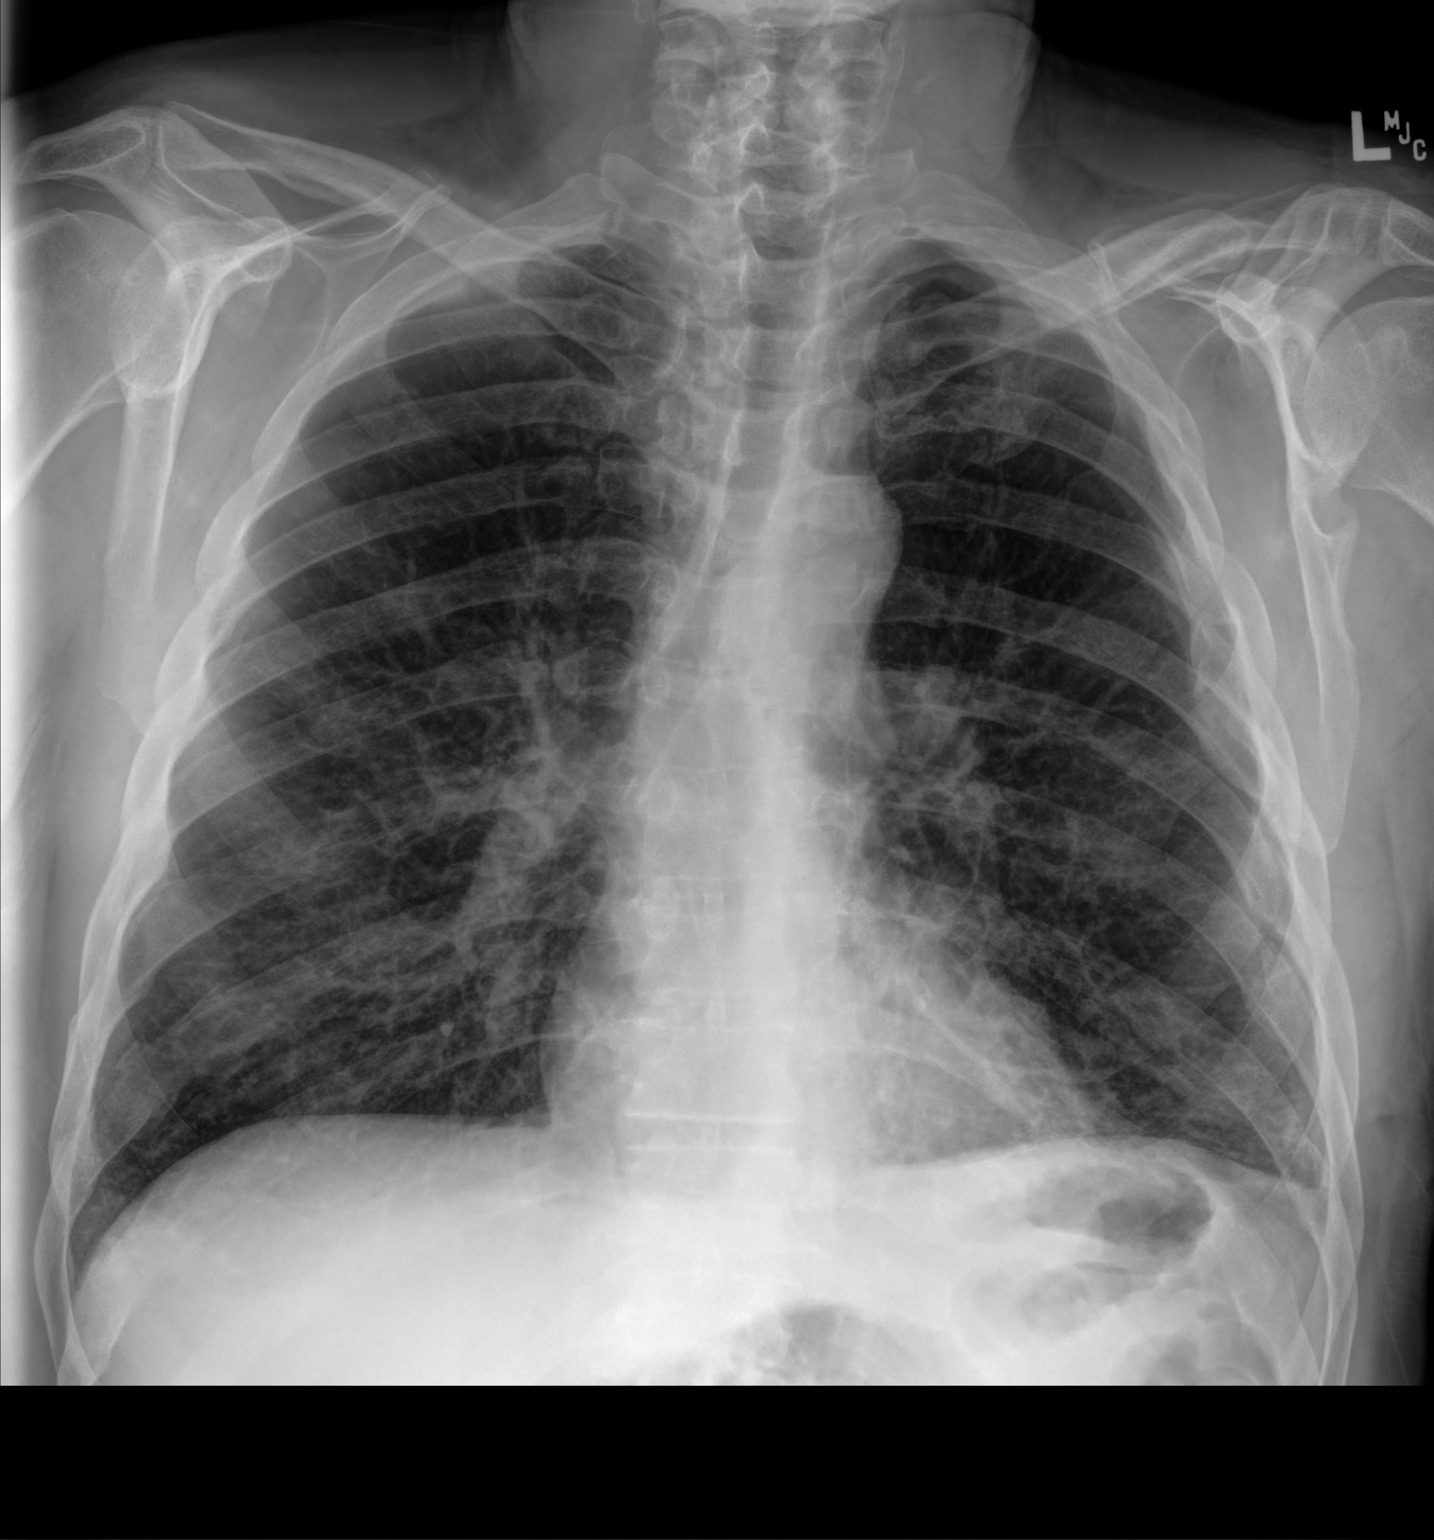

[chest lat]
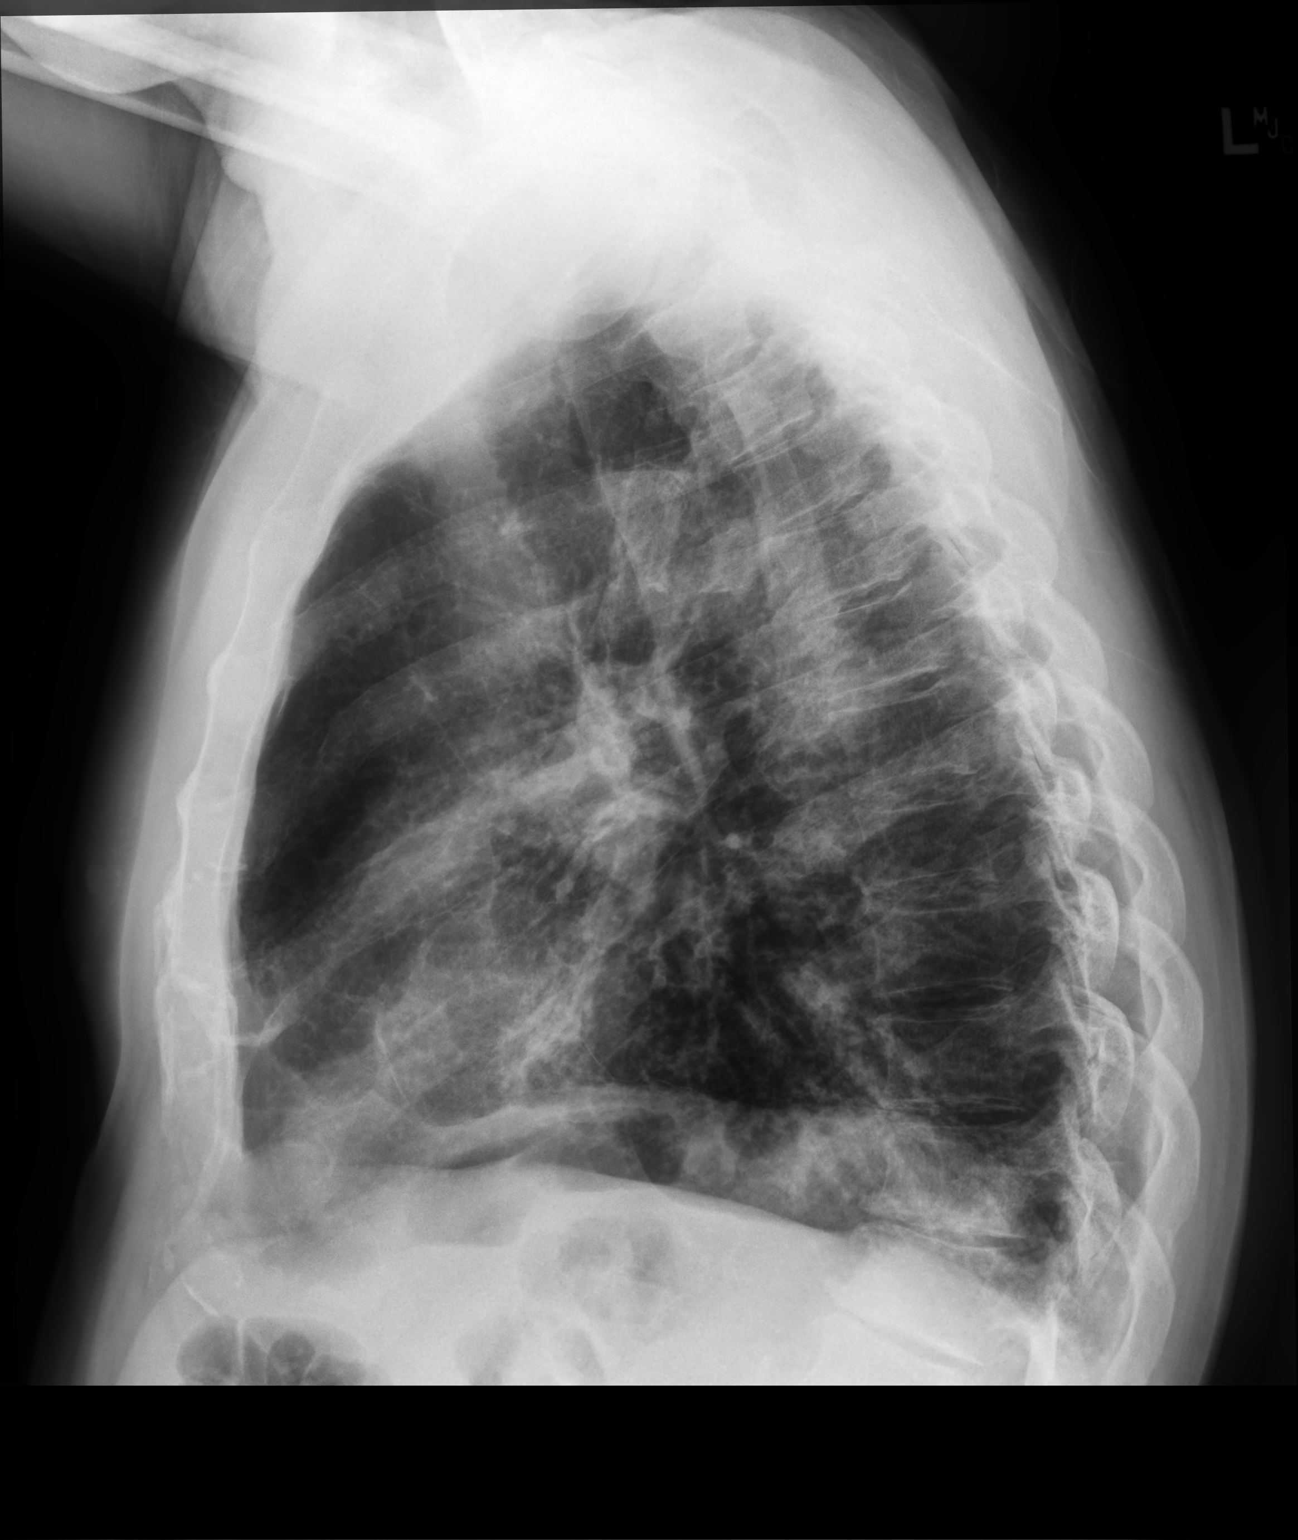

[2 of 2 positions shown; findings below may reference images not displayed]

FINDINGS: Normal heart size, mediastinal contours, and pulmonary vascularity.

Coarsening of interstitial markings throughout both lungs appear
stable.

Minimal LEFT basilar atelectasis.

Remaining lungs clear.

Previously identified RIGHT basilar infiltrate resolved.

No pleural effusion or pneumothorax.

Multiple old BILATERAL rib fractures again seen.
IMPRESSION: Chronic accentuation of interstitial markings, stable.

Minimal LEFT basilar atelectasis.

Resolution of previously identified RIGHT basilar infiltrate without
recurrent pneumonia.

## 2020-03-26 ENCOUNTER — Other Ambulatory Visit: Payer: Self-pay

## 2020-03-26 ENCOUNTER — Emergency Department (HOSPITAL_COMMUNITY): Payer: Medicaid Other

## 2020-03-26 ENCOUNTER — Encounter (HOSPITAL_COMMUNITY): Payer: Self-pay | Admitting: Emergency Medicine

## 2020-03-26 ENCOUNTER — Inpatient Hospital Stay (HOSPITAL_COMMUNITY)
Admission: EM | Admit: 2020-03-26 | Discharge: 2020-03-28 | DRG: 193 | Disposition: A | Discharge status: Home Health | Payer: Medicaid Other | Attending: Internal Medicine | Admitting: Internal Medicine

## 2020-03-26 DIAGNOSIS — I10 Essential (primary) hypertension: Secondary | ICD-10-CM | POA: Diagnosis not present

## 2020-03-26 DIAGNOSIS — E119 Type 2 diabetes mellitus without complications: Secondary | ICD-10-CM

## 2020-03-26 DIAGNOSIS — Z87891 Personal history of nicotine dependence: Secondary | ICD-10-CM

## 2020-03-26 DIAGNOSIS — Z8249 Family history of ischemic heart disease and other diseases of the circulatory system: Secondary | ICD-10-CM | POA: Diagnosis not present

## 2020-03-26 DIAGNOSIS — R0902 Hypoxemia: Secondary | ICD-10-CM | POA: Diagnosis not present

## 2020-03-26 DIAGNOSIS — Z8616 Personal history of COVID-19: Secondary | ICD-10-CM

## 2020-03-26 DIAGNOSIS — Z87898 Personal history of other specified conditions: Secondary | ICD-10-CM

## 2020-03-26 DIAGNOSIS — J439 Emphysema, unspecified: Secondary | ICD-10-CM | POA: Diagnosis not present

## 2020-03-26 DIAGNOSIS — R7989 Other specified abnormal findings of blood chemistry: Secondary | ICD-10-CM | POA: Diagnosis present

## 2020-03-26 DIAGNOSIS — I491 Atrial premature depolarization: Secondary | ICD-10-CM | POA: Diagnosis not present

## 2020-03-26 DIAGNOSIS — Z825 Family history of asthma and other chronic lower respiratory diseases: Secondary | ICD-10-CM

## 2020-03-26 DIAGNOSIS — J9611 Chronic respiratory failure with hypoxia: Secondary | ICD-10-CM | POA: Diagnosis present

## 2020-03-26 DIAGNOSIS — J159 Unspecified bacterial pneumonia: Secondary | ICD-10-CM | POA: Diagnosis present

## 2020-03-26 DIAGNOSIS — Z888 Allergy status to other drugs, medicaments and biological substances status: Secondary | ICD-10-CM | POA: Diagnosis not present

## 2020-03-26 DIAGNOSIS — Z7952 Long term (current) use of systemic steroids: Secondary | ICD-10-CM | POA: Diagnosis not present

## 2020-03-26 DIAGNOSIS — Z20822 Contact with and (suspected) exposure to covid-19: Secondary | ICD-10-CM | POA: Diagnosis present

## 2020-03-26 DIAGNOSIS — J9620 Acute and chronic respiratory failure, unspecified whether with hypoxia or hypercapnia: Secondary | ICD-10-CM | POA: Diagnosis present

## 2020-03-26 DIAGNOSIS — F1090 Alcohol use, unspecified, uncomplicated: Secondary | ICD-10-CM

## 2020-03-26 DIAGNOSIS — J440 Chronic obstructive pulmonary disease with acute lower respiratory infection: Secondary | ICD-10-CM | POA: Diagnosis present

## 2020-03-26 DIAGNOSIS — J80 Acute respiratory distress syndrome: Secondary | ICD-10-CM | POA: Diagnosis not present

## 2020-03-26 DIAGNOSIS — R778 Other specified abnormalities of plasma proteins: Secondary | ICD-10-CM | POA: Diagnosis not present

## 2020-03-26 DIAGNOSIS — B0223 Postherpetic polyneuropathy: Secondary | ICD-10-CM | POA: Diagnosis present

## 2020-03-26 DIAGNOSIS — Z9981 Dependence on supplemental oxygen: Secondary | ICD-10-CM | POA: Diagnosis not present

## 2020-03-26 DIAGNOSIS — F79 Unspecified intellectual disabilities: Secondary | ICD-10-CM | POA: Diagnosis present

## 2020-03-26 DIAGNOSIS — F1010 Alcohol abuse, uncomplicated: Secondary | ICD-10-CM | POA: Diagnosis present

## 2020-03-26 DIAGNOSIS — Z7951 Long term (current) use of inhaled steroids: Secondary | ICD-10-CM

## 2020-03-26 DIAGNOSIS — J189 Pneumonia, unspecified organism: Secondary | ICD-10-CM | POA: Diagnosis not present

## 2020-03-26 DIAGNOSIS — J9621 Acute and chronic respiratory failure with hypoxia: Secondary | ICD-10-CM | POA: Diagnosis present

## 2020-03-26 DIAGNOSIS — Z7984 Long term (current) use of oral hypoglycemic drugs: Secondary | ICD-10-CM | POA: Diagnosis not present

## 2020-03-26 DIAGNOSIS — R0602 Shortness of breath: Secondary | ICD-10-CM | POA: Diagnosis not present

## 2020-03-26 DIAGNOSIS — J441 Chronic obstructive pulmonary disease with (acute) exacerbation: Secondary | ICD-10-CM | POA: Diagnosis not present

## 2020-03-26 DIAGNOSIS — R000 Tachycardia, unspecified: Secondary | ICD-10-CM | POA: Diagnosis not present

## 2020-03-26 DIAGNOSIS — Z8701 Personal history of pneumonia (recurrent): Secondary | ICD-10-CM

## 2020-03-26 DIAGNOSIS — Z79899 Other long term (current) drug therapy: Secondary | ICD-10-CM | POA: Diagnosis not present

## 2020-03-26 LAB — CBC WITH DIFFERENTIAL/PLATELET
Abs Immature Granulocytes: 0.03 10*3/uL (ref 0.00–0.07)
Basophils Absolute: 0 10*3/uL (ref 0.0–0.1)
Basophils Relative: 0 %
Eosinophils Absolute: 0.1 10*3/uL (ref 0.0–0.5)
Eosinophils Relative: 2 %
HCT: 38.8 % — ABNORMAL LOW (ref 39.0–52.0)
Hemoglobin: 12.4 g/dL — ABNORMAL LOW (ref 13.0–17.0)
Immature Granulocytes: 0 %
Lymphocytes Relative: 32 %
Lymphs Abs: 2.9 10*3/uL (ref 0.7–4.0)
MCH: 29.3 pg (ref 26.0–34.0)
MCHC: 32 g/dL (ref 30.0–36.0)
MCV: 91.7 fL (ref 80.0–100.0)
Monocytes Absolute: 0.9 10*3/uL (ref 0.1–1.0)
Monocytes Relative: 10 %
Neutro Abs: 5 10*3/uL (ref 1.7–7.7)
Neutrophils Relative %: 56 %
Platelets: 276 10*3/uL (ref 150–400)
RBC: 4.23 MIL/uL (ref 4.22–5.81)
RDW: 13.2 % (ref 11.5–15.5)
WBC: 8.9 10*3/uL (ref 4.0–10.5)
nRBC: 0 % (ref 0.0–0.2)

## 2020-03-26 LAB — I-STAT CHEM 8, ED
BUN: 12 mg/dL (ref 6–20)
Calcium, Ion: 1.16 mmol/L (ref 1.15–1.40)
Chloride: 105 mmol/L (ref 98–111)
Creatinine, Ser: 0.8 mg/dL (ref 0.61–1.24)
Glucose, Bld: 150 mg/dL — ABNORMAL HIGH (ref 70–99)
HCT: 39 % (ref 39.0–52.0)
Hemoglobin: 13.3 g/dL (ref 13.0–17.0)
Potassium: 4 mmol/L (ref 3.5–5.1)
Sodium: 144 mmol/L (ref 135–145)
TCO2: 29 mmol/L (ref 22–32)

## 2020-03-26 LAB — HEMOGLOBIN A1C
Hgb A1c MFr Bld: 6.7 % — ABNORMAL HIGH (ref 4.8–5.6)
Mean Plasma Glucose: 145.59 mg/dL

## 2020-03-26 LAB — TROPONIN I (HIGH SENSITIVITY)
Troponin I (High Sensitivity): 19 ng/L — ABNORMAL HIGH (ref <18)
Troponin I (High Sensitivity): 15 ng/L (ref <18)

## 2020-03-26 LAB — CBG MONITORING, ED: Glucose-Capillary: 189 mg/dL — ABNORMAL HIGH (ref 70–99)

## 2020-03-26 LAB — RESP PANEL BY RT-PCR (FLU A&B, COVID) ARPGX2
Influenza A by PCR: NEGATIVE
Influenza B by PCR: NEGATIVE
SARS Coronavirus 2 by RT PCR: NEGATIVE

## 2020-03-26 LAB — BRAIN NATRIURETIC PEPTIDE: B Natriuretic Peptide: 101.5 pg/mL — ABNORMAL HIGH (ref 0.0–100.0)

## 2020-03-26 LAB — PROCALCITONIN: Procalcitonin: <0.1 ng/mL

## 2020-03-26 LAB — GLUCOSE, CAPILLARY
Glucose-Capillary: 142 mg/dL — ABNORMAL HIGH (ref 70–99)
Glucose-Capillary: 189 mg/dL — ABNORMAL HIGH (ref 70–99)

## 2020-03-26 MED ORDER — SODIUM CHLORIDE 0.9 % IV SOLN
1.0000 g | Freq: Once | INTRAVENOUS | Status: AC
  Administered 2020-03-26: 1 g via INTRAVENOUS
  Filled 2020-03-26: qty 10

## 2020-03-26 MED ORDER — ACETAMINOPHEN 650 MG RE SUPP: 650.0000 mg | Freq: Four times a day (QID) | RECTAL | Status: DC | PRN

## 2020-03-26 MED ORDER — FLUTTER DEVI: 1.0000 | Status: DC

## 2020-03-26 MED ORDER — ACETAMINOPHEN 325 MG PO TABS
650.0000 mg | ORAL_TABLET | Freq: Four times a day (QID) | ORAL | Status: DC | PRN
  Administered 2020-03-27: 650 mg via ORAL
  Filled 2020-03-26: qty 2

## 2020-03-26 MED ORDER — SODIUM CHLORIDE 0.9% FLUSH
3.0000 mL | Freq: Two times a day (BID) | INTRAVENOUS | Status: DC
  Administered 2020-03-26 – 2020-03-28 (×4): 3 mL via INTRAVENOUS

## 2020-03-26 MED ORDER — BUDESONIDE 0.5 MG/2ML IN SUSP
0.5000 mg | Freq: Two times a day (BID) | RESPIRATORY_TRACT | Status: DC
  Administered 2020-03-26 – 2020-03-28 (×4): 0.5 mg via RESPIRATORY_TRACT
  Filled 2020-03-26 (×6): qty 2

## 2020-03-26 MED ORDER — IPRATROPIUM BROMIDE 0.02 % IN SOLN
0.5000 mg | Freq: Once | RESPIRATORY_TRACT | Status: AC
  Administered 2020-03-26: 0.5 mg via RESPIRATORY_TRACT
  Filled 2020-03-26: qty 2.5

## 2020-03-26 MED ORDER — ENOXAPARIN SODIUM 40 MG/0.4ML ~~LOC~~ SOLN
40.0000 mg | SUBCUTANEOUS | Status: DC
  Administered 2020-03-26 – 2020-03-28 (×3): 40 mg via SUBCUTANEOUS
  Filled 2020-03-26 (×3): qty 0.4

## 2020-03-26 MED ORDER — ALBUTEROL (5 MG/ML) CONTINUOUS INHALATION SOLN
7.5000 mg/h | INHALATION_SOLUTION | Freq: Once | RESPIRATORY_TRACT | Status: AC
  Administered 2020-03-26: 7.5 mg/h via RESPIRATORY_TRACT
  Filled 2020-03-26: qty 20

## 2020-03-26 MED ORDER — ONDANSETRON HCL 4 MG/2ML IJ SOLN: 4.0000 mg | Freq: Four times a day (QID) | INTRAMUSCULAR | Status: DC | PRN

## 2020-03-26 MED ORDER — ADULT MULTIVITAMIN W/MINERALS CH
1.0000 | ORAL_TABLET | Freq: Every day | ORAL | Status: DC
  Administered 2020-03-26 – 2020-03-28 (×3): 1 via ORAL
  Filled 2020-03-26 (×3): qty 1

## 2020-03-26 MED ORDER — LORAZEPAM 1 MG PO TABS
1.0000 mg | ORAL_TABLET | ORAL | Status: DC | PRN
  Administered 2020-03-27 (×2): 1 mg via ORAL
  Filled 2020-03-26 (×2): qty 1

## 2020-03-26 MED ORDER — INSULIN ASPART 100 UNIT/ML ~~LOC~~ SOLN
0.0000 [IU] | Freq: Three times a day (TID) | SUBCUTANEOUS | Status: DC
  Administered 2020-03-26: 2 [IU] via SUBCUTANEOUS
  Administered 2020-03-26: 3 [IU] via SUBCUTANEOUS
  Administered 2020-03-27 (×3): 2 [IU] via SUBCUTANEOUS
  Administered 2020-03-28 (×2): 3 [IU] via SUBCUTANEOUS

## 2020-03-26 MED ORDER — METHYLPREDNISOLONE SODIUM SUCC 125 MG IJ SOLR
60.0000 mg | Freq: Three times a day (TID) | INTRAMUSCULAR | Status: DC
  Administered 2020-03-26 – 2020-03-27 (×3): 60 mg via INTRAVENOUS
  Filled 2020-03-26 (×3): qty 2

## 2020-03-26 MED ORDER — TRIAMCINOLONE ACETONIDE 0.1 % EX CREA
1.0000 "application " | TOPICAL_CREAM | Freq: Two times a day (BID) | CUTANEOUS | Status: DC
  Administered 2020-03-26 – 2020-03-28 (×4): 1 via TOPICAL
  Filled 2020-03-26: qty 15

## 2020-03-26 MED ORDER — CEFTRIAXONE SODIUM 1 G IJ SOLR
1.0000 g | INTRAMUSCULAR | Status: DC
  Administered 2020-03-27 – 2020-03-28 (×2): 1 g via INTRAVENOUS
  Filled 2020-03-26: qty 1
  Filled 2020-03-26 (×2): qty 10

## 2020-03-26 MED ORDER — UMECLIDINIUM BROMIDE 62.5 MCG/INH IN AEPB
1.0000 | INHALATION_SPRAY | Freq: Every day | RESPIRATORY_TRACT | Status: DC
  Administered 2020-03-27 – 2020-03-28 (×2): 1 via RESPIRATORY_TRACT
  Filled 2020-03-26 (×3): qty 7

## 2020-03-26 MED ORDER — ARFORMOTEROL TARTRATE 15 MCG/2ML IN NEBU
15.0000 ug | INHALATION_SOLUTION | Freq: Two times a day (BID) | RESPIRATORY_TRACT | Status: DC
  Administered 2020-03-26 – 2020-03-28 (×4): 15 ug via RESPIRATORY_TRACT
  Filled 2020-03-26 (×7): qty 2

## 2020-03-26 MED ORDER — METHYLPREDNISOLONE SODIUM SUCC 125 MG IJ SOLR: 125.0000 mg | Freq: Once | INTRAMUSCULAR | Status: AC

## 2020-03-26 MED ORDER — GUAIFENESIN ER 600 MG PO TB12
600.0000 mg | ORAL_TABLET | Freq: Two times a day (BID) | ORAL | Status: DC
  Administered 2020-03-26 – 2020-03-28 (×5): 600 mg via ORAL
  Filled 2020-03-26 (×5): qty 1

## 2020-03-26 MED ORDER — ALBUTEROL SULFATE (2.5 MG/3ML) 0.083% IN NEBU
2.5000 mg | INHALATION_SOLUTION | Freq: Four times a day (QID) | RESPIRATORY_TRACT | Status: DC
  Administered 2020-03-26 – 2020-03-28 (×9): 2.5 mg via RESPIRATORY_TRACT
  Filled 2020-03-26 (×9): qty 3

## 2020-03-26 MED ORDER — INSULIN ASPART 100 UNIT/ML ~~LOC~~ SOLN: 0.0000 [IU] | Freq: Every day | SUBCUTANEOUS | Status: DC

## 2020-03-26 MED ORDER — METOPROLOL SUCCINATE ER 25 MG PO TB24
25.0000 mg | ORAL_TABLET | Freq: Every day | ORAL | Status: DC
  Administered 2020-03-26 – 2020-03-28 (×3): 25 mg via ORAL
  Filled 2020-03-26 (×3): qty 1

## 2020-03-26 MED ORDER — PANTOPRAZOLE SODIUM 40 MG PO TBEC
40.0000 mg | DELAYED_RELEASE_TABLET | Freq: Every day | ORAL | Status: DC
  Administered 2020-03-26 – 2020-03-28 (×3): 40 mg via ORAL
  Filled 2020-03-26 (×3): qty 1

## 2020-03-26 MED ORDER — LORAZEPAM 2 MG/ML IJ SOLN: 1.0000 mg | INTRAMUSCULAR | Status: DC | PRN

## 2020-03-26 MED ORDER — ONDANSETRON HCL 4 MG PO TABS: 4.0000 mg | ORAL_TABLET | Freq: Four times a day (QID) | ORAL | Status: DC | PRN

## 2020-03-26 MED ORDER — ALBUTEROL SULFATE (2.5 MG/3ML) 0.083% IN NEBU
5.0000 mg | INHALATION_SOLUTION | Freq: Once | RESPIRATORY_TRACT | Status: AC
  Administered 2020-03-26: 5 mg via RESPIRATORY_TRACT
  Filled 2020-03-26: qty 6

## 2020-03-26 MED ORDER — SODIUM CHLORIDE 0.9 % IV SOLN
500.0000 mg | INTRAVENOUS | Status: DC
  Administered 2020-03-27 – 2020-03-28 (×2): 500 mg via INTRAVENOUS
  Filled 2020-03-26 (×3): qty 500

## 2020-03-26 MED ORDER — THIAMINE HCL 100 MG PO TABS
100.0000 mg | ORAL_TABLET | Freq: Every day | ORAL | Status: DC
  Administered 2020-03-26 – 2020-03-28 (×3): 100 mg via ORAL
  Filled 2020-03-26 (×3): qty 1

## 2020-03-26 MED ORDER — FOLIC ACID 1 MG PO TABS
1.0000 mg | ORAL_TABLET | Freq: Every day | ORAL | Status: DC
  Administered 2020-03-26 – 2020-03-28 (×3): 1 mg via ORAL
  Filled 2020-03-26 (×3): qty 1

## 2020-03-26 MED ORDER — ALBUTEROL SULFATE (2.5 MG/3ML) 0.083% IN NEBU: 2.5000 mg | INHALATION_SOLUTION | RESPIRATORY_TRACT | Status: DC | PRN

## 2020-03-26 MED ORDER — LOSARTAN POTASSIUM 50 MG PO TABS
100.0000 mg | ORAL_TABLET | Freq: Every day | ORAL | Status: DC
  Administered 2020-03-26 – 2020-03-28 (×3): 100 mg via ORAL
  Filled 2020-03-26 (×3): qty 2

## 2020-03-26 MED ORDER — SODIUM CHLORIDE 0.9 % IV SOLN
500.0000 mg | Freq: Once | INTRAVENOUS | Status: AC
  Administered 2020-03-26: 500 mg via INTRAVENOUS
  Filled 2020-03-26: qty 500

## 2020-03-26 MED ORDER — THIAMINE HCL 100 MG/ML IJ SOLN: 100.0000 mg | Freq: Every day | INTRAMUSCULAR | Status: DC

## 2020-03-26 NOTE — H&P (Signed)
History and Physical    James [name] SJG:[id number] DOB: 1960/05/16 DOA: 03/26/2020  Referring MD/NP/PA: Jennette Kettle, MD PCP: [name], MD  Patient coming from: Home  Chief Complaint: Shortness of breath  I have personally briefly reviewed patient's old medical records in [place]   HPI: James [name] is a 60 y.o. male with medical history significant of hypertension, COPD, chronic respiratory failure on 2- 4 L nasal cannula oxygen, diabetes mellitus type 2, recurrent pneumonia, and COVID-19 infection in 10/2019 presents with complaints of difficulty breathing starting 2 nights ago.  He is unsure what possibly onset symptoms.  Complains of having intermittent nonproductive cough for which he feels that he cannot get any air in..  Denies having any significant fever, chills, chest pain, palpitations, abdominal pain, nausea, vomiting, or recent sick contacts to his knowledge.  Patient had just recently been hospitalized last with COPD exacerbation and pneumonia due to COVID-19 in 10/2019.   In route with EMS patient was given 10 mg of albuterol and 125 mg of Solu-Medrol.   ED Course: Upon admission into the emergency department patient was seen to be afebrile, pulse 90-112, respirations 19-34, blood pressures 131/81-166/94, and O2 saturations maintained on 5 L of nasal cannula oxygen.  Labs significant for hemoglobin 12.4, glucose 150, BNP 101.5, and troponin 19.  Chest x-ray showed chronic bronchiectatic markings versus a bronchopneumonia at the bases.  Patient was given hour-long albuterol neb, azithromycin, and Rocephin.  Review of Systems  Unable to perform ROS: Severe respiratory distress  Constitutional: Positive for malaise/fatigue. Negative for fever.  HENT: Negative for congestion and nosebleeds.   Eyes: Negative for photophobia and pain.  Respiratory: Positive for cough and shortness of breath. Negative for wheezing.   Cardiovascular: Negative for chest pain  and leg swelling.  Gastrointestinal: Negative for abdominal pain, nausea and vomiting.  Genitourinary: Negative for dysuria and hematuria.  Musculoskeletal: Negative for falls.  Skin: Negative for itching and rash.  Neurological: Negative for focal weakness and loss of consciousness.  Psychiatric/Behavioral: Negative for substance abuse.    Past Medical History:  Diagnosis Date  . Acute on chronic respiratory failure with hypoxia ([place]) 06/08/2013  . CAP (community acquired pneumonia) 05/17/2018   See admit 05/17/18 ? rml  with   covid pcr neg - rx augmentin > f/u cxr in 4-6 weeks is fine unless condition declines   . Community acquired pneumonia of right lower lobe of lung 05/17/2018   See admit 05/17/18 ? rml  with   covid pcr neg - rx augmentin > f/u cxr in 4-6 weeks is fine unless condition declines   . COPD (chronic obstructive pulmonary disease) ([place])    not on home  . Diabetes ([place])   . Dyspnea   . Hypertension   . Pneumonia 04/06/2016  . Pneumonia due to COVID-19 virus 10/19/2019  . Pneumothorax    2016, fell from ladder  . 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 Tick bite 06/03/2018    Past Surgical History:  Procedure Laterality Date  . VIDEO ASSISTED THORACOSCOPY (VATS)/THOROCOTOMY Left 06/11/2013   Procedure: VIDEO ASSISTED THORACOSCOPY (VATS)/THOROCOTOMY;  Surgeon: Ivin Poot, MD;  Location: [place] [facility];  Service: Thoracic;  Laterality: Left;  [name] VIDEO BRONCHOSCOPY N/A 06/11/2013   Procedure: VIDEO BRONCHOSCOPY;  Surgeon: Ivin Poot, MD;  Location: [place];  Service: Thoracic;  Laterality: N/A;  . VIDEO BRONCHOSCOPY N/A 08/10/2018   Procedure: VIDEO BRONCHOSCOPY WITH FLUORO;  Surgeon: Candee Furbish, MD;  Location: [facility]  ENDOSCOPY;  Service: Endoscopy;  Laterality: N/A;     reports that he quit smoking about 8 years ago. His smoking use included cigarettes. He has a 60.00 pack-year smoking history. His smokeless tobacco use includes chew. He reports current alcohol use of about  28.0 standard drinks of alcohol per week. He reports current drug use. Drug: Marijuana.  Allergies  Allergen Reactions  . Gabapentin Other (See Comments)    hallucinations    Family History  Problem Relation Age of Onset  . Heart disease Father   . COPD Sister     Prior to Admission medications   Medication Sig Start Date End Date Taking? Authorizing Provider  Accu-Chek Softclix Lancets lancets Use as instructed to check blood sugar once daily. 08/15/19  Yes [name], MD  albuterol (PROVENTIL) (2.5 MG/3ML) 0.083% nebulizer solution Take 3 mLs (2.5 mg total) by nebulization in the morning, at noon, and at bedtime. Patient taking differently: Take 2.5 mg by nebulization daily. 02/13/20 04/13/20 Yes [name], MD  albuterol (VENTOLIN HFA) 108 (90 Base) MCG/ACT inhaler Inhale 2 puffs into the lungs every 4 (four) hours as needed for wheezing or shortness of breath. 11/16/19  Yes Ward, Delice Bison, DO  Blood Glucose Monitoring Suppl (ACCU-CHEK GUIDE ME) w/Device KIT 1 each by Other route in the morning and at bedtime. 08/15/19  Yes [provider]  Blood Glucose Monitoring Suppl (TRUE METRIX METER) w/Device KIT Use to measure blood sugar twice a day 08/14/19  Yes [name], MD  budesonide-formoterol [facility]) 160-4.5 MCG/ACT inhaler Inhale 2 puffs into the lungs 2 (two) times daily. 11/14/19 11/13/20 Yes [name], MD  furosemide (LASIX) 20 MG tablet Take 1 tablet (20 mg total) by mouth daily as needed for edema (lower extremity). 10/05/19  Yes [name], MD  glucose blood (TRUE METRIX BLOOD GLUCOSE TEST) test strip Use as instructed 08/14/19  Yes [name], MD  losartan (COZAAR) 100 MG tablet Take 1 tablet (100 mg total) by mouth daily. 01/16/20  Yes [name], MD  metFORMIN (GLUCOPHAGE) 500 MG tablet Take 2 tablets (1,000 mg total) by mouth 2 (two) times daily with a meal. 12/03/19 02/01/20 Yes [name], MD  metoprolol succinate  (TOPROL-XL) 25 MG 24 hr tablet Take 1 tablet (25 mg total) by mouth daily. 01/16/20  Yes [name], MD  pantoprazole (PROTONIX) 40 MG tablet Take 1 tablet (40 mg total) by mouth daily. 10/02/19  Yes [name], MD  predniSONE (DELTASONE) 20 MG tablet Take 1 tablet (20 mg total) by mouth daily with breakfast. 02/13/20  Yes [name], MD  Respiratory Therapy Supplies (FLUTTER) DEVI Use 4 times daily Patient taking differently: 1 each by Other route See admin instructions. Use 4 times daily 04/27/18  Yes [name], MD  Tiotropium Bromide Monohydrate (SPIRIVA RESPIMAT) 2.5 MCG/ACT AERS Inhale 2 puffs into the lungs daily. 02/13/20  Yes [name], MD  triamcinolone (KENALOG) 0.1 % Apply 1 application topically 2 (two) times daily. 02/13/20  Yes [name], MD  terbinafine (LAMISIL) 250 MG tablet Take 250 mg by mouth daily. Patient not taking: No sig reported    [provider]    Physical Exam:  Constitutional: Middle-age male who appears to be in respiratory distress Vitals:   03/26/20 0713 03/26/20 0715 03/26/20 0730 03/26/20 0745  BP:  (!) 156/81 (!) 166/94 (!) 142/85  Pulse: (!) 109 (!) 108 (!) 109 (!) 112  Resp: 17 (!) _0 Temp:      TempSrc:      SpO2: 97% 96% 97% 94%   Eyes: PERRL, lids and conjunctivae normal ENMT: Mucous membranes are moist. Posterior pharynx clear of any exudate or lesions.  Neck: normal, supple, no masses, no thyromegaly Respiratory: Significantly decreased  aeration with positive expiratory wheeze.  Currently on 5 L of nasal cannula oxygen only able to talk in shortened to 3 word sentences. Cardiovascular: Regular rate and rhythm, no murmurs / rubs / gallops. No extremity edema. 2+ pedal pulses. No carotid bruits.  Abdomen: no tenderness, no masses palpated. No hepatosplenomegaly. Bowel sounds positive.  Musculoskeletal: no clubbing / cyanosis. No joint deformity upper and lower extremities. Good ROM, no  contractures. Normal muscle tone.  Skin: no rashes, lesions, ulcers. No induration Neurologic: CN 2-12 grossly intact. Sensation intact, DTR normal. Strength 5/5 in all 4.  Psychiatric: Normal judgment and insight. Alert and oriented x 3. Normal mood.     Labs on Admission: I have personally reviewed following labs and imaging studies  CBC: Recent Labs  Lab 03/26/20 0441 03/26/20 0507  WBC 8.9  --   NEUTROABS 5.0  --   HGB 12.4* 13.3  HCT 38.8* 39.0  MCV 91.7  --   PLT 276  --    Basic Metabolic Panel: Recent Labs  Lab 03/26/20 0507  NA 144  K 4.0  CL 105  GLUCOSE 150*  BUN 12  CREATININE 0.80   GFR: CrCl cannot be calculated (Unknown ideal weight.). Liver Function Tests: No results for input(s): AST, ALT, ALKPHOS, BILITOT, PROT, ALBUMIN in the last 168 hours. No results for input(s): LIPASE, AMYLASE in the last 168 hours. No results for input(s): AMMONIA in the last 168 hours. Coagulation Profile: No results for input(s): INR, PROTIME in the last 168 hours. Cardiac Enzymes: No results for input(s): CKTOTAL, CKMB, CKMBINDEX, TROPONINI in the last 168 hours. BNP (last 3 results) No results for input(s): PROBNP in the last 8760 hours. HbA1C: No results for input(s): HGBA1C in the last 72 hours. CBG: No results for input(s): GLUCAP in the last 168 hours. Lipid Profile: No results for input(s): CHOL, HDL, LDLCALC, TRIG, CHOLHDL, LDLDIRECT in the last 72 hours. Thyroid Function Tests: No results for input(s): TSH, T4TOTAL, FREET4, T3FREE, THYROIDAB in the last 72 hours. Anemia Panel: No results for input(s): VITAMINB12, FOLATE, FERRITIN, TIBC, IRON, RETICCTPCT in the last 72 hours. Urine analysis:    Component Value Date/Time   COLORURINE YELLOW 07/04/2019 1220   APPEARANCEUR CLEAR 07/04/2019 1220   LABSPEC 1.021 07/04/2019 1220   PHURINE 5.0 07/04/2019 1220   GLUCOSEU >=500 (A) 07/04/2019 1220   HGBUR NEGATIVE 07/04/2019 1220   BILIRUBINUR NEGATIVE  07/04/2019 1220   BILIRUBINUR negative 09/25/2018 1432   KETONESUR NEGATIVE 07/04/2019 1220   PROTEINUR NEGATIVE 07/04/2019 1220   UROBILINOGEN 0.2 02/19/2019 1029   NITRITE NEGATIVE 07/04/2019 1220   LEUKOCYTESUR NEGATIVE 07/04/2019 1220   Sepsis Labs: Recent Results (from the past 240 hour(s))  Resp Panel by RT-PCR (Flu A&B, Covid) Nasopharyngeal Swab     Status: None   Collection Time: 03/26/20  5:01 AM   Specimen: Nasopharyngeal Swab; Nasopharyngeal(NP) swabs in vial transport medium  Result Value Ref Range Status   SARS Coronavirus 2 by RT PCR NEGATIVE NEGATIVE Final    Comment: (NOTE) SARS-CoV-2 target nucleic acids are NOT DETECTED.  The SARS-CoV-2 RNA is generally detectable in upper respiratory specimens during the acute phase of infection.  The lowest concentration of SARS-CoV-2 viral copies this assay can detect is 138 copies/mL. A negative result does not preclude SARS-Cov-2 infection and should not be used as the sole basis for treatment or other patient management decisions. A negative result may occur with  improper specimen collection/handling, submission of specimen other than nasopharyngeal swab, presence of viral mutation(s) within the areas targeted by this assay, and inadequate number of viral copies(<138 copies/mL). A negative result must be combined with clinical observations, patient history, and epidemiological information. The expected result is Negative.  Fact Sheet for Patients:  EntrepreneurPulse.com.au  Fact Sheet for Healthcare Providers:  IncredibleEmployment.be  This test is no t yet approved or cleared by the [place] FDA and  has been authorized for detection and/or diagnosis of SARS-CoV-2 by FDA under an Emergency Use Authorization (EUA). This EUA will remain  in effect (meaning this test can be used) for the duration of the COVID-19 declaration under Section 564(b)(1) of the Act, 21 U.S.C.section  360bbb-3(b)(1), unless the authorization is terminated  or revoked sooner.       Influenza A by PCR NEGATIVE NEGATIVE Final   Influenza B by PCR NEGATIVE NEGATIVE Final    Comment: (NOTE) The Xpert Xpress SARS-CoV-2/FLU/RSV plus assay is intended as an aid in the diagnosis of influenza from Nasopharyngeal swab specimens and should not be used as a sole basis for treatment. Nasal washings and aspirates are unacceptable for Xpert Xpress SARS-CoV-2/FLU/RSV testing.  Fact Sheet for Patients: EntrepreneurPulse.com.au  Fact Sheet for Healthcare Providers: IncredibleEmployment.be  This test is not yet approved or cleared by the [place] FDA and has been authorized for detection and/or diagnosis of SARS-CoV-2 by FDA under an Emergency Use Authorization (EUA). This EUA will remain in effect (meaning this test can be used) for the duration of the COVID-19 declaration under Section 564(b)(1) of the Act, 21 U.S.C. section 360bbb-3(b)(1), unless the authorization is terminated or revoked.  Performed at [facility], [place] [address], [place], [place] [address]      Radiological Exams on Admission: DG Chest Portable 1 View  Result Date: 03/26/2020 CLINICAL DATA:  Shortness of breath EXAM: PORTABLE CHEST 1 VIEW COMPARISON:  11/16/2019 FINDINGS: Increased markings at the bases with apical lucency. Normal heart size. No edema, effusion, or pneumothorax. Multiple remote bilateral rib fractures. IMPRESSION: 1. Chronic bronchitic markings versus bronchopneumonia at the bases. 2. Emphysema. Electronically Signed   By: Monte Fantasia M.D.   On: 03/26/2020 05:04    EKG: Independently reviewed.  Sinus rhythm at 93 bpm with atrial premature complex.  Assessment/Plan Respiratory failure with hypoxia secondary to COPD exacerbation: Acute on chronic.  Patient presents with complaints of being unable to breathe with a nonproductive cough over the last 2 days.   On physical exam patient with decreased aeration and expiratory wheeze appreciated.  O2 saturations currently maintained on 4 L of nasal cannula oxygen.  He is always on at least 2 L of oxygen at baseline. -Admit to a medical telemetry bed -COPD order set -Continuous pulse oximetry with nasal cannula oxygen to maintain O2 saturation greater than 90%. -Flutter valve -Albuterol nebs 4 times daily and as needed -Substitution of Brovana and budesonide nebs for Symbicort -Continue Spiriva -Mucinex  Suspected community-acquired pneumonia:Acute. Patient with history of frequent pneumonia.  Chest x-ray concerning for possibility of a bronchopneumonia at the bases. -Check procalcitonin -Continue antibiotics of Rocephin and azithromycin  Elevated troponin: Acute.  High-sensitivity troponin elevated at 19.  EKG without significant ischemic changes.  Suspect secondary to acute demand in the setting of respiratory distress. -Continue to trend cardiac troponin.  Diabetes mellitus type 2, controlled: Glucose initially 150.  Last hemoglobin A1c 6.3 on 02/13/2020.  Home medication regimen includes Metformin 1000 mg twice daily. -Hypoglycemic protocols -Hold Metformin -CBGs before every meal and at bedtime with moderate SSI as patient would be on steroids  Hypertension: On admission blood pressure elevated up to 166/94.  Home blood pressure medications include losartan 100 mg daily and metoprolol 25 mg daily. -Continue home medication regimen  Elevated BNP: Acute.  BNP mildly elevated at 101.5.  Patient appears to be euvolemic at this time. -Monitor intake and output  History of alcohol abuse -CIWA protocols  GERD: Home medication regimen includes Protonix 40 mg daily -Continue PPI   DVT prophylaxis: Lovenox Code Status: Full Family Communication: Patient declined need to update family. Disposition Plan: Likely discharge home once medically stable Consults called: None Admission status:  Observation  Norval Morton MD [facility]   If 7PM-7AM, please contact night-coverage   03/26/2020, 8:07 AM

## 2020-03-26 NOTE — Plan of Care (Signed)

## 2020-03-26 NOTE — Evaluation (Signed)
Physical Therapy Evaluation Patient Details Name: James [name] MRN: [id number] DOB: [birth year] Today's Date: 03/26/2020   History of Present Illness  Pt is a 60 y/o male admitted 3/23 secondary to increased SOB. Thought to be secondary to COPD exacerbation. PMH includes COPD on supplemental oxygen, DM, and HTN.  Clinical Impression  Pt admitted secondary to problem above with deficits below. Pt only able to tolerate taking side steps at EOB secondary to increased SOB this session. Oxygen sats at 96-97% on 5L. Required min guard A For safety throughout. Anticipate pt will progress well once symptoms improve. Will continue to follow acutely.     Follow Up Recommendations Home health PT (vs No PT follow up pending progression)    Equipment Recommendations  None recommended by PT    Recommendations for Other Services       Precautions / Restrictions Precautions Precautions: Fall Restrictions Weight Bearing Restrictions: No      Mobility  Bed Mobility Overal bed mobility: Needs Assistance Bed Mobility: Supine to Sit;Sit to Supine     Supine to sit: Supervision Sit to supine: Supervision   General bed mobility comments: Supervision for safety. Pt with increased SOB upon sitting and required seated rest and pursed lip breathing. Oxygen sats at 96% on 5L.    Transfers Overall transfer level: Needs assistance Equipment used: None Transfers: Sit to/from Stand Sit to Stand: Min guard         General transfer comment: Min guard for safety. Increased SOB, so only took side steps at EOB with min guard A. Pt's oxygen sats maintained at 96-97% on 5L.  Ambulation/Gait                Stairs            Wheelchair Mobility    Modified Rankin (Stroke Patients Only)       Balance Overall balance assessment: Needs assistance Sitting-balance support: No upper extremity supported;Feet supported Sitting balance-Leahy Scale: Good     Standing balance support: No  upper extremity supported;During functional activity Standing balance-Leahy Scale: Fair                               Pertinent Vitals/Pain Pain Assessment: Faces Faces Pain Scale: Hurts little more Pain Location: back Pain Descriptors / Indicators: Aching Pain Intervention(s): Limited activity within patient's tolerance;Monitored during session;Repositioned    Home Living Family/patient expects to be discharged to:: Private residence Living Arrangements: Alone Available Help at Discharge: Family;Available PRN/intermittently Type of Home: House Home Access: Stairs to enter Entrance Stairs-Rails: None Entrance Stairs-Number of Steps: 2 Home Layout: One level Home Equipment: None      Prior Function Level of Independence: Independent               Hand Dominance        Extremity/Trunk Assessment   Upper Extremity Assessment Upper Extremity Assessment: Defer to OT evaluation    Lower Extremity Assessment Lower Extremity Assessment: Generalized weakness    Cervical / Trunk Assessment Cervical / Trunk Assessment: Normal  Communication   Communication: HOH  Cognition Arousal/Alertness: Awake/alert Behavior During Therapy: WFL for tasks assessed/performed Overall Cognitive Status: Within Functional Limits for tasks assessed                                        General Comments  Exercises     Assessment/Plan    PT Assessment Patient needs continued PT services  PT Problem List Cardiopulmonary status limiting activity;Decreased activity tolerance;Decreased balance;Decreased mobility;Decreased strength       PT Treatment Interventions DME instruction;Gait training;Functional mobility training;Stair training;Therapeutic exercise;Therapeutic activities;Balance training;Patient/family education    PT Goals (Current goals can be found in the Care Plan section)  Acute Rehab PT Goals Patient Stated Goal: to go home PT Goal  Formulation: With patient Time For Goal Achievement: 04/09/20 Potential to Achieve Goals: Good    Frequency Min 3X/week   Barriers to discharge        Co-evaluation               AM-PAC PT "6 Clicks" Mobility  Outcome Measure Help needed turning from your back to your side while in a flat bed without using bedrails?: None Help needed moving from lying on your back to sitting on the side of a flat bed without using bedrails?: None Help needed moving to and from a bed to a chair (including a wheelchair)?: A Little Help needed standing up from a chair using your arms (e.g., wheelchair or bedside chair)?: A Little Help needed to walk in hospital room?: A Little Help needed climbing 3-5 steps with a railing? : A Lot 6 Click Score: 19    End of Session Equipment Utilized During Treatment: Oxygen Activity Tolerance: Treatment limited secondary to medical complications (Comment) (SOB) Patient left: in bed;with call bell/phone within reach (on stretcher in ED) Nurse Communication: Mobility status PT Visit Diagnosis: Other abnormalities of gait and mobility (R26.89)    Time: 7915-0569 PT Time Calculation (min) (ACUTE ONLY): 15 min   Charges:   PT Evaluation $PT Eval Moderate Complexity: 1 Mod          Reuel Derby, PT, DPT  Acute Rehabilitation Services  Pager: [phone] Office: [phone]   Rudean Hitt 03/26/2020, 1:11 PM

## 2020-03-26 NOTE — ED Notes (Signed)
Resp called for continuous neb

## 2020-03-26 NOTE — ED Triage Notes (Signed)
Pt from home by EMS, woke up from sleep with increased SOB. Wears 2L [place] during the day and 4L [place] at night. Given 10mg  albuterol, 125mg  solumedrol. EMS reported diminished R, wheezing L upper

## 2020-03-26 NOTE — ED Provider Notes (Signed)
[facility] EMERGENCY DEPARTMENT Provider Note   CSN: [id number] Arrival date & time: 03/26/20  0435     History Chief Complaint  Patient presents with  . Respiratory Distress    James [name] is a 61 y.o. male.  The history is provided by the patient.  Shortness of Breath Severity:  Severe Onset quality:  Gradual Timing:  Constant Progression:  Worsening Chronicity:  Recurrent Context: not fumes   Relieved by:  Nothing Worsened by:  Nothing Ineffective treatments:  None tried Associated symptoms: cough and wheezing   Associated symptoms: no abdominal pain, no fever and no rash   Risk factors: no tobacco use   Patient with COPD presents with SOB and wheezing on his typical home O2.      Past Medical History:  Diagnosis Date  . Acute on chronic respiratory failure with hypoxia ([place]) 06/08/2013  . CAP (community acquired pneumonia) 05/17/2018   See admit 05/17/18 ? rml  with   covid pcr neg - rx augmentin > f/u cxr in 4-6 weeks is fine unless condition declines   . Community acquired pneumonia of right lower lobe of lung 05/17/2018   See admit 05/17/18 ? rml  with   covid pcr neg - rx augmentin > f/u cxr in 4-6 weeks is fine unless condition declines   . COPD (chronic obstructive pulmonary disease) ([place])    not on home  . Diabetes ([place])   . Dyspnea   . Hypertension   . Pneumonia 04/06/2016  . Pneumonia due to COVID-19 virus 10/19/2019  . Pneumothorax    2016, fell from ladder  . 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 Tick bite 06/03/2018    Patient Active Problem List   Diagnosis Date Noted  . Pain of right forearm 12/03/2019  . Bronchiectasis ([place]) 09/24/2019  . Type 2 diabetes mellitus without complication, without long-term current use of insulin ([place]) 07/24/2019  . Other atopic dermatitis 05/01/2019  . Chronic respiratory failure with hypoxia ([place]) 04/23/2019  . Intellectual disability 04/23/2019  . History of alcohol use 11/23/2018  .  Hypertension 05/17/2016  . History of tobacco abuse 04/06/2016  . COPD mixed type ([place]) 03/26/2016    Past Surgical History:  Procedure Laterality Date  . VIDEO ASSISTED THORACOSCOPY (VATS)/THOROCOTOMY Left 06/11/2013   Procedure: VIDEO ASSISTED THORACOSCOPY (VATS)/THOROCOTOMY;  Surgeon: Ivin Poot, MD;  Location: [facility];  Service: Thoracic;  Laterality: Left;  [name] VIDEO BRONCHOSCOPY N/A 06/11/2013   Procedure: VIDEO BRONCHOSCOPY;  Surgeon: Ivin Poot, MD;  Location: [facility];  Service: Thoracic;  Laterality: N/A;  . VIDEO BRONCHOSCOPY N/A 08/10/2018   Procedure: VIDEO BRONCHOSCOPY WITH FLUORO;  Surgeon: Candee Furbish, MD;  [facility]COPY;  Service: Endoscopy;  Laterality: N/A;       Family History  Problem Relation Age of Onset  . Heart disease Father   . COPD Sister     Social History   Tobacco Use  . Smoking status: Former Smoker    Packs/day: 1.50    Years: 40.00    Pack years: 60.00    Types: Cigarettes    Quit date: 2014    Years since quitting: 8.2  . Smokeless tobacco: Current User    Types: Chew  Vaping Use  . Vaping Use: Never used  Substance Use Topics  . Alcohol use: Yes    Alcohol/week: 28.0 standard drinks    Types: 28 Cans of beer per week    Comment: 4 beers a night; +  jitters with not drinking, denies DTs or seizures  . Drug use: Yes    Types: Marijuana    Comment: daily; quit using crack and methamphetamine about 5 months ago     Home Medications Prior to Admission medications   Medication Sig Start Date End Date Taking? Authorizing Provider  Accu-Chek Softclix Lancets lancets Use as instructed to check blood sugar once daily. Patient not taking: No sig reported 08/15/19   [name], MD  albuterol (PROVENTIL) (2.5 MG/3ML) 0.083% nebulizer solution Take 3 mLs (2.5 mg total) by nebulization in the morning, at noon, and at bedtime. 02/13/20 04/13/20  [name], MD  albuterol (VENTOLIN HFA) 108 (90 Base) MCG/ACT inhaler Inhale 2  puffs into the lungs every 4 (four) hours as needed for wheezing or shortness of breath. 11/16/19   Ward, Delice Bison, DO  Blood Glucose Monitoring Suppl (ACCU-CHEK GUIDE ME) w/Device KIT 1 each by Other route in the morning and at bedtime.  Patient not taking: No sig reported 08/15/19   [provider]  Blood Glucose Monitoring Suppl (TRUE METRIX METER) w/Device KIT Use to measure blood sugar twice a day Patient not taking: No sig reported 08/14/19   [name], MD  budesonide-formoterol [facility]) 160-4.5 MCG/ACT inhaler Inhale 2 puffs into the lungs 2 (two) times daily. 11/14/19 11/13/20  [name], MD  furosemide (LASIX) 20 MG tablet Take 1 tablet (20 mg total) by mouth daily as needed for edema (lower extremity). Patient not taking: Reported on 03/04/2020 10/05/19   [name], MD  glucose blood (TRUE METRIX BLOOD GLUCOSE TEST) test strip Use as instructed Patient not taking: No sig reported 08/14/19   [name], MD  losartan (COZAAR) 100 MG tablet Take 1 tablet (100 mg total) by mouth daily. 01/16/20   [name], MD  metFORMIN (GLUCOPHAGE) 500 MG tablet Take 2 tablets (1,000 mg total) by mouth 2 (two) times daily with a meal. 12/03/19 02/01/20  [name], MD  metoprolol succinate (TOPROL-XL) 25 MG 24 hr tablet Take 1 tablet (25 mg total) by mouth daily. 01/16/20   [name], MD  pantoprazole (PROTONIX) 40 MG tablet Take 1 tablet (40 mg total) by mouth daily. 10/02/19   [name], MD  predniSONE (DELTASONE) 20 MG tablet Take 1 tablet (20 mg total) by mouth daily with breakfast. 02/13/20   [name], MD  Respiratory Therapy Supplies (FLUTTER) DEVI Use 4 times daily Patient taking differently: 1 each by Other route See admin instructions. Use 4 times daily 04/27/18   [name], MD  terbinafine (LAMISIL) 250 MG tablet Take 250 mg by mouth daily.    [provider]  Tiotropium Bromide Monohydrate (SPIRIVA RESPIMAT)  2.5 MCG/ACT AERS Inhale 2 puffs into the lungs daily. 02/13/20   [name], MD  triamcinolone (KENALOG) 0.1 % Apply 1 application topically 2 (two) times daily. 02/13/20   [name], MD    Allergies    Gabapentin  Review of Systems   Review of Systems  Constitutional: Negative for fever.  HENT: Negative for congestion.   Eyes: Negative for visual disturbance.  Respiratory: Positive for cough, shortness of breath and wheezing.   Cardiovascular: Negative for leg swelling.  Gastrointestinal: Negative for abdominal pain.  Genitourinary: Negative for difficulty urinating.  Musculoskeletal: Negative for arthralgias.  Skin: Negative for rash.  Neurological: Negative for syncope.  Psychiatric/Behavioral: Negative for agitation.  All other systems reviewed and are negative.  Physical Exam Updated Vital Signs BP (!) 156/84   Pulse 91   Temp 98.1 F (36.7 C) (Oral)   Resp (!) 24   SpO2 100%   Physical Exam Vitals and nursing note reviewed.  Constitutional:      Appearance: Normal appearance. He is not diaphoretic.  HENT:     Head: Normocephalic and atraumatic.     Nose: Nose normal.  Eyes:     Conjunctiva/sclera: Conjunctivae normal.     Pupils: Pupils are equal, round, and reactive to light.  Cardiovascular:     Rate and Rhythm: Normal rate and regular rhythm.     Pulses: Normal pulses.     Heart sounds: Normal heart sounds.  Pulmonary:     Effort: Tachypnea present.     Breath sounds: Wheezing present.  Abdominal:     General: Abdomen is flat. Bowel sounds are normal.     Palpations: Abdomen is soft.     Tenderness: There is no abdominal tenderness. There is no guarding.  Musculoskeletal:     Cervical back: Normal range of motion and neck supple.     Right lower leg: No edema.     Left lower leg: No edema.  Skin:    General: Skin is warm and dry.     Capillary Refill: Capillary refill takes less than 2 seconds.  Neurological:     General: No focal  deficit present.     Mental Status: He is alert.     Deep Tendon Reflexes: Reflexes normal.  Psychiatric:        Mood and Affect: Mood normal.        Behavior: Behavior normal.     ED Results / Procedures / Treatments   Labs (all labs ordered are listed, but only abnormal results are displayed) Results for orders placed or performed during the hospital encounter of 03/26/20  CBC with Differential/Platelet  Result Value Ref Range   WBC 8.9 4.0 - 10.5 K/uL   RBC 4.23 4.22 - 5.81 MIL/uL   Hemoglobin 12.4 (L) 13.0 - 17.0 g/dL   HCT 38.8 (L) 39.0 - 52.0 %   MCV 91.7 80.0 - 100.0 fL   MCH 29.3 26.0 - 34.0 pg   MCHC 32.0 30.0 - 36.0 g/dL   RDW 13.2 11.5 - 15.5 %   Platelets 276 150 - 400 K/uL   nRBC 0.0 0.0 - 0.2 %   Neutrophils Relative % 56 %   Neutro Abs 5.0 1.7 - 7.7 K/uL   Lymphocytes Relative 32 %   Lymphs Abs 2.9 0.7 - 4.0 K/uL   Monocytes Relative 10 %   Monocytes Absolute 0.9 0.1 - 1.0 K/uL   Eosinophils Relative 2 %   Eosinophils Absolute 0.1 0.0 - 0.5 K/uL   Basophils Relative 0 %   Basophils Absolute 0.0 0.0 - 0.1 K/uL   Immature Granulocytes 0 %   Abs Immature Granulocytes 0.03 0.00 - 0.07 K/uL  I-stat chem 8, ED (not at [facility] or [facility])  Result Value Ref Range   Sodium 144 135 - 145 mmol/L   Potassium 4.0 3.5 - 5.1 mmol/L   Chloride 105 98 - 111 mmol/L   BUN 12 6 - 20 mg/dL   Creatinine, Ser 0.80 0.61 - 1.24 mg/dL   Glucose, Bld 150 (H) 70 - 99 mg/dL   Calcium, Ion 1.16 1.15 - 1.40 mmol/L   TCO2 29 22 - 32 mmol/L   Hemoglobin 13.3 13.0 - 17.0 g/dL   HCT 39.0 39.0 -  52.0 %   DG Chest Portable 1 View  Result Date: 03/26/2020 CLINICAL DATA:  Shortness of breath EXAM: PORTABLE CHEST 1 VIEW COMPARISON:  11/16/2019 FINDINGS: Increased markings at the bases with apical lucency. Normal heart size. No edema, effusion, or pneumothorax. Multiple remote bilateral rib fractures. IMPRESSION: 1. Chronic bronchitic markings versus bronchopneumonia at the bases. 2. Emphysema.  Electronically Signed   By: Monte Fantasia M.D.   On: 03/26/2020 05:04    EKG  EKG Interpretation  Date/Time:  Wednesday March 26 2020 04:45:22 EDT Ventricular Rate:  93 PR Interval:    QRS Duration: 100 QT Interval:  387 QTC Calculation: 482 R Axis:   46 Text Interpretation: Sinus rhythm Atrial premature complex Confirmed by Dory Horn) on 03/26/2020 5:44:33 AM      Radiology DG Chest Portable 1 View  Result Date: 03/26/2020 CLINICAL DATA:  Shortness of breath EXAM: PORTABLE CHEST 1 VIEW COMPARISON:  11/16/2019 FINDINGS: Increased markings at the bases with apical lucency. Normal heart size. No edema, effusion, or pneumothorax. Multiple remote bilateral rib fractures. IMPRESSION: 1. Chronic bronchitic markings versus bronchopneumonia at the bases. 2. Emphysema. Electronically Signed   By: Monte Fantasia M.D.   On: 03/26/2020 05:04    Procedures Procedures   Medications Ordered in ED Medications  cefTRIAXone (ROCEPHIN) 1 g in sodium chloride 0.9 % 100 mL IVPB (1 g Intravenous New Bag/Given 03/26/20 0542)  azithromycin (ZITHROMAX) 500 mg in sodium chloride 0.9 % 250 mL IVPB (500 mg Intravenous New Bag/Given 03/26/20 0545)  albuterol (PROVENTIL) (2.5 MG/3ML) 0.083% nebulizer solution 5 mg (5 mg Nebulization Given 03/26/20 0456)  ipratropium (ATROVENT) nebulizer solution 0.5 mg (0.5 mg Nebulization Given 03/26/20 0456)  methylPREDNISolone sodium succinate (SOLU-MEDROL) 125 mg/2 mL injection 125 mg (125 mg Intravenous Given by EMS 03/26/20 0447)  albuterol (PROVENTIL,VENTOLIN) solution continuous neb (7.5 mg/hr Nebulization Given 03/26/20 0558)   MDM Reviewed: previous chart, nursing note and vitals Interpretation: labs, ECG and x-ray (COPD by me on CXR, normal white count ) Total time providing critical care: 30-74 minutes (continuous neb ). This excludes time spent performing separately reportable procedures and services. Consults: admitting MD  CRITICAL CARE Performed  by: Carmisha Larusso K Tiffiany Beadles-Rasch Total critical care time: 60 minutes Critical care time was exclusive of separately billable procedures and treating other patients. Critical care was necessary to treat or prevent imminent or life-threatening deterioration. Critical care was time spent personally by me on the following activities: development of treatment plan with patient and/or surrogate as well as nursing, discussions with consultants, evaluation of patient's response to treatment, examination of patient, obtaining history from patient or surrogate, ordering and performing treatments and interventions, ordering and review of laboratory studies, ordering and review of radiographic studies, pulse oximetry and re-evaluation of patient's condition.  ED Course  I have reviewed the triage vital signs and the nursing notes.  Pertinent labs & imaging results that were available during my care of the patient were reviewed by me and considered in my medical decision making (see chart for details).  James [name] was evaluated in Emergency Department on 03/26/2020 for the symptoms described in the history of present illness. He was evaluated in the context of the global COVID-19 pandemic, which necessitated consideration that the patient might be at risk for infection with the SARS-CoV-2 virus that causes COVID-19. Institutional protocols and algorithms that pertain to the evaluation of patients at risk for COVID-19 are in a state of rapid change based on information released by regulatory bodies  including the CDC and federal and state organizations. These policies and algorithms were followed during the patient's care in the ED.  Final Clinical Impression(s) / ED Diagnoses Final diagnoses:  COPD exacerbation ([place])  Community acquired pneumonia, unspecified laterality   Admit to medicine    Yacine Droz, MD 03/26/20 8365

## 2020-03-26 NOTE — ED Notes (Signed)
RN states he is unable to take report due to eating lunch

## 2020-03-27 DIAGNOSIS — R778 Other specified abnormalities of plasma proteins: Secondary | ICD-10-CM | POA: Diagnosis not present

## 2020-03-27 DIAGNOSIS — J441 Chronic obstructive pulmonary disease with (acute) exacerbation: Secondary | ICD-10-CM | POA: Diagnosis not present

## 2020-03-27 DIAGNOSIS — J189 Pneumonia, unspecified organism: Secondary | ICD-10-CM | POA: Diagnosis not present

## 2020-03-27 DIAGNOSIS — Z87898 Personal history of other specified conditions: Secondary | ICD-10-CM | POA: Diagnosis not present

## 2020-03-27 DIAGNOSIS — J9621 Acute and chronic respiratory failure with hypoxia: Secondary | ICD-10-CM

## 2020-03-27 LAB — CBC
HCT: 38.9 % — ABNORMAL LOW (ref 39.0–52.0)
Hemoglobin: 12.1 g/dL — ABNORMAL LOW (ref 13.0–17.0)
MCH: 28.5 pg (ref 26.0–34.0)
MCHC: 31.1 g/dL (ref 30.0–36.0)
MCV: 91.7 fL (ref 80.0–100.0)
Platelets: 269 10*3/uL (ref 150–400)
RBC: 4.24 MIL/uL (ref 4.22–5.81)
RDW: 13.2 % (ref 11.5–15.5)
WBC: 11.7 10*3/uL — ABNORMAL HIGH (ref 4.0–10.5)
nRBC: 0 % (ref 0.0–0.2)

## 2020-03-27 LAB — COMPREHENSIVE METABOLIC PANEL
ALT: 20 U/L (ref 0–44)
AST: 17 U/L (ref 15–41)
Albumin: 3.5 g/dL (ref 3.5–5.0)
Alkaline Phosphatase: 33 U/L — ABNORMAL LOW (ref 38–126)
Anion gap: 7 (ref 5–15)
BUN: 16 mg/dL (ref 6–20)
CO2: 30 mmol/L (ref 22–32)
Calcium: 9.7 mg/dL (ref 8.9–10.3)
Chloride: 101 mmol/L (ref 98–111)
Creatinine, Ser: 0.8 mg/dL (ref 0.61–1.24)
GFR, Estimated: >60 mL/min (ref >60)
Glucose, Bld: 195 mg/dL — ABNORMAL HIGH (ref 70–99)
Potassium: 4.5 mmol/L (ref 3.5–5.1)
Sodium: 138 mmol/L (ref 135–145)
Total Bilirubin: 0.3 mg/dL (ref 0.3–1.2)
Total Protein: 5.9 g/dL — ABNORMAL LOW (ref 6.5–8.1)

## 2020-03-27 LAB — MAGNESIUM: Magnesium: 1.9 mg/dL (ref 1.7–2.4)

## 2020-03-27 LAB — GLUCOSE, CAPILLARY
Glucose-Capillary: 126 mg/dL — ABNORMAL HIGH (ref 70–99)
Glucose-Capillary: 141 mg/dL — ABNORMAL HIGH (ref 70–99)
Glucose-Capillary: 149 mg/dL — ABNORMAL HIGH (ref 70–99)
Glucose-Capillary: 176 mg/dL — ABNORMAL HIGH (ref 70–99)

## 2020-03-27 LAB — PHOSPHORUS: Phosphorus: 3.9 mg/dL (ref 2.5–4.6)

## 2020-03-27 MED ORDER — METHYLPREDNISOLONE SODIUM SUCC 40 MG IJ SOLR
40.0000 mg | Freq: Three times a day (TID) | INTRAMUSCULAR | Status: DC
  Administered 2020-03-27 – 2020-03-28 (×3): 40 mg via INTRAVENOUS
  Filled 2020-03-27 (×3): qty 1

## 2020-03-27 NOTE — Progress Notes (Signed)
No bipap needed at this time. Pt resting comfortably on 2L [place] RR18 Spo2 96.

## 2020-03-27 NOTE — Progress Notes (Signed)
Patient ID: James [name], male   DOB: Apr 15, 1960, 60 y.o.   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  On presentation, he required 5 L oxygen via nasal cannula; chest x-ray showed chronic bronchiectatic markings versus  bronchopneumonia at the basis.  He was started on Solu-Medrol and IV antibiotics.  Assessment & Plan:   Acute on chronic hypoxic respiratory failure COPD exacerbation Probable community-acquired bacterial pneumonia -Patient normally wears 2 to 4 L oxygen at home.  He had COVID-19 infection in 10/2019 but is still not vaccinated against COVID-19 yet -Required 5 L oxygen via nasal cannula on presentation. chest x-ray showed chronic bronchiectatic markings versus  bronchopneumonia at the basis.  COVID-19 and influenza tests were negative -Respiratory status improving: Currently on 3 L oxygen via nasal cannula. -Decree Solu-Medrol to 40 mg IV every 8.  Continue nebs and inhalers.  Continue Rocephin and Zithromax.  Procalcitonin less than 0.1  Elevated troponin -High-sensitivity troponin did not trend upwards.  EKG without any ischemic changes.  No chest pain.  No further cardiac work-up needed  Diabetes mellitus type 2 -A1c 6.7.  Continue CBGs with SSI.  Metformin on hold.  Hypertension -Continue metoprolol and losartan  History of alcohol abuse -continue CIWA protocol.  Continue thiamine, folic acid and multivitamin  Generalized deconditioning -PT eval   DVT prophylaxis: Lovenox Code Status: Full Family Communication: None at bedside Disposition Plan: Status is:  Inpatient  Remains inpatient appropriate because:Inpatient level of care appropriate due to severity of illness   Dispo: The patient is from: Home              Anticipated d/c is to: Home in 1 to 2 days if clinically improves              Patient currently is not medically stable to d/c.   Difficult to place patient No   Consultants: None  Procedures: None  Antimicrobials: Rocephin and Zithromax from 03/26/2020 onwards   Subjective: Patient seen and examined at bedside.  He feels slightly better.  Still short of breath with exertion.  No overnight fever, vomiting, chest pain reported.  Objective: Vitals:   03/27/20 0549 03/27/20 0730 03/27/20 0737 03/27/20 0804  BP: (!) 143/80  (!) 144/93 138/72  Pulse: 76  81 82  Resp: 16  18   Temp: 98 F (36.7 C)  98 F (36.7 C)   TempSrc: Oral  Oral   SpO2: 100% 99% 98%    No intake or output data in the 24 hours ending 03/27/20 1006 There were no vitals filed for this visit.  Examination:  General exam: Appears calm and comfortable.  Currently on 3 L oxygen by nasal cannula.  Looks chronically ill. Respiratory system: Bilateral decreased breath sounds at bases with scattered crackles Cardiovascular system: S1 & S2 heard, Rate controlled Gastrointestinal system: Abdomen is nondistended, soft and nontender. Normal bowel sounds heard. Extremities: No cyanosis, clubbing; trace lower extremity edema  Central nervous system: Alert and oriented.  Poor historian.  No focal neurological deficits. Moving extremities Skin: No rashes, lesions or ulcers Psychiatry: Flat affect    Data Reviewed: I have  personally reviewed following labs and imaging studies  CBC: Recent Labs  Lab 03/26/20 0441 03/26/20 0507 03/27/20 0141  WBC 8.9  --  11.7*  NEUTROABS 5.0  --   --   HGB 12.4* 13.3 12.1*  HCT 38.8* 39.0 38.9*  MCV 91.7  --  91.7  PLT 276  --  341   Basic Metabolic Panel: Recent Labs  Lab 03/26/20 0507 03/27/20 0141  NA 144 138   K 4.0 4.5  CL 105 101  CO2  --  30  GLUCOSE 150* 195*  BUN 12 16  CREATININE 0.80 0.80  CALCIUM  --  9.7  MG  --  1.9  PHOS  --  3.9   GFR: CrCl cannot be calculated (Unknown ideal weight.). Liver Function Tests: Recent Labs  Lab 03/27/20 0141  AST 17  ALT 20  ALKPHOS 33*  BILITOT 0.3  PROT 5.9*  ALBUMIN 3.5   No results for input(s): LIPASE, AMYLASE in the last 168 hours. No results for input(s): AMMONIA in the last 168 hours. Coagulation Profile: No results for input(s): INR, PROTIME in the last 168 hours. Cardiac Enzymes: No results for input(s): CKTOTAL, CKMB, CKMBINDEX, TROPONINI in the last 168 hours. BNP (last 3 results) No results for input(s): PROBNP in the last 8760 hours. HbA1C: Recent Labs    03/26/20 0840  HGBA1C 6.7*   CBG: Recent Labs  Lab 03/26/20 1156 03/26/20 1639 03/26/20 2114 03/27/20 0755  GLUCAP 189* 142* 189* 126*   Lipid Profile: No results for input(s): CHOL, HDL, LDLCALC, TRIG, CHOLHDL, LDLDIRECT in the last 72 hours. Thyroid Function Tests: No results for input(s): TSH, T4TOTAL, FREET4, T3FREE, THYROIDAB in the last 72 hours. Anemia Panel: No results for input(s): VITAMINB12, FOLATE, FERRITIN, TIBC, IRON, RETICCTPCT in the last 72 hours. Sepsis Labs: Recent Labs  Lab 03/26/20 0840  PROCALCITON <0.10    Recent Results (from the past 240 hour(s))  Resp Panel by RT-PCR (Flu A&B, Covid) Nasopharyngeal Swab     Status: None   Collection Time: 03/26/20  5:01 AM   Specimen: Nasopharyngeal Swab; Nasopharyngeal(NP) swabs in vial transport medium  Result Value Ref Range Status   SARS Coronavirus 2 by RT PCR NEGATIVE NEGATIVE Final    Comment: (NOTE) SARS-CoV-2 target nucleic acids are NOT DETECTED.  The SARS-CoV-2 RNA is generally detectable in upper respiratory specimens during the acute phase of infection. The lowest concentration of SARS-CoV-2 viral copies this assay can detect is 138 copies/mL. A negative result does not  preclude SARS-Cov-2 infection and should not be used as the sole basis for treatment or other patient management decisions. A negative result may occur with  improper specimen collection/handling, submission of specimen other than nasopharyngeal swab, presence of viral mutation(s) within the areas targeted by this assay, and inadequate number of viral copies(<138 copies/mL). A negative result must be combined with clinical observations, patient history, and epidemiological information. The expected result is Negative.  Fact Sheet for Patients:  EntrepreneurPulse.com.au  Fact Sheet for Healthcare Providers:  IncredibleEmployment.be  This test is no t yet approved or cleared by the [place] FDA and  has been authorized for detection and/or diagnosis of SARS-CoV-2 by FDA under an Emergency Use Authorization (EUA). This EUA will remain  in effect (meaning this test can be used) for the duration of the COVID-19 declaration under Section 564(b)(1) of the Act, 21 U.S.C.section 360bbb-3(b)(1), unless the authorization is terminated  or revoked sooner.       Influenza A by PCR  NEGATIVE NEGATIVE Final   Influenza B by PCR NEGATIVE NEGATIVE Final    Comment: (NOTE) The Xpert Xpress SARS-CoV-2/FLU/RSV plus assay is intended as an aid in the diagnosis of influenza from Nasopharyngeal swab specimens and should not be used as a sole basis for treatment. Nasal washings and aspirates are unacceptable for Xpert Xpress SARS-CoV-2/FLU/RSV testing.  Fact Sheet for Patients: EntrepreneurPulse.com.au  Fact Sheet for Healthcare Providers: IncredibleEmployment.be  This test is not yet approved or cleared by the [place] FDA and has been authorized for detection and/or diagnosis of SARS-CoV-2 by FDA under an Emergency Use Authorization (EUA). This EUA will remain in effect (meaning this test can be used) for the  duration of the COVID-19 declaration under Section 564(b)(1) of the Act, 21 U.S.C. section 360bbb-3(b)(1), unless the authorization is terminated or revoked.  Performed at [facility], [place] [address], [place], [place] [address]          Radiology Studies: DG Chest Portable 1 View  Result Date: 03/26/2020 CLINICAL DATA:  Shortness of breath EXAM: PORTABLE CHEST 1 VIEW COMPARISON:  11/16/2019 FINDINGS: Increased markings at the bases with apical lucency. Normal heart size. No edema, effusion, or pneumothorax. Multiple remote bilateral rib fractures. IMPRESSION: 1. Chronic bronchitic markings versus bronchopneumonia at the bases. 2. Emphysema. Electronically Signed   By: Monte Fantasia M.D.   On: 03/26/2020 05:04        Scheduled Meds: . albuterol  2.5 mg Nebulization QID  . arformoterol  15 mcg Nebulization BID  . budesonide (PULMICORT) nebulizer solution  0.5 mg Nebulization BID  . enoxaparin (LOVENOX) injection  40 mg Subcutaneous Q24H  . folic acid  1 mg Oral Daily  . guaiFENesin  600 mg Oral BID  . insulin aspart  0-15 Units Subcutaneous TID WC  . insulin aspart  0-5 Units Subcutaneous QHS  . losartan  100 mg Oral Daily  . methylPREDNISolone (SOLU-MEDROL) injection  60 mg Intravenous Q8H  . metoprolol succinate  25 mg Oral Daily  . multivitamin with minerals  1 tablet Oral Daily  . pantoprazole  40 mg Oral Daily  . sodium chloride flush  3 mL Intravenous Q12H  . thiamine  100 mg Oral Daily  . triamcinolone  1 application Topical BID  . umeclidinium bromide  1 puff Inhalation Daily   Continuous Infusions: . azithromycin    . cefTRIAXone (ROCEPHIN)  IV 1 g (03/27/20 7342)          [name], MD [facility] 03/27/2020, 10:06 AM

## 2020-03-27 NOTE — Evaluation (Signed)
Occupational Therapy Evaluation Patient Details Name: James [name] MRN: [id number] DOB: October 31, 1960 Today's Date: 03/27/2020    History of Present Illness Pt is a 60 y/o male admitted 3/23 secondary to increased SOB. Thought to be secondary to COPD exacerbation. PMH includes COPD on supplemental oxygen, DM, and HTN.   Clinical Impression   Pt PTA: Pt living alone with family in area to assist as needed. Pt currently, modified independence for ADL and mobility in room and in hallway with no AD.  Pt ad lib in room with tremors all over body. Pt able to feed himself and stand at sink for ADL tasks. Pt ambulatory in room and hallway, avoiding obstacles with ease. No LOB episodes, but cues to slow down for safety.  Pt does not require continued OT skilled services. OT signing off. Thank you for referral.    Follow Up Recommendations  No OT follow up    Equipment Recommendations  None recommended by OT    Recommendations for Other Services       Precautions / Restrictions Precautions Precautions: Other (comment) Precaution Comments: O2; 2L at baseline Restrictions Weight Bearing Restrictions: No      Mobility Bed Mobility Overal bed mobility: Needs Assistance Bed Mobility: Supine to Sit;Sit to Supine     Supine to sit: Supervision Sit to supine: Supervision   General bed mobility comments: Supervision for safety. Pt > 92% O2 on 2L throughout session.    Transfers Overall transfer level: Needs assistance Equipment used: None Transfers: Sit to/from Stand Sit to Stand: Min guard         General transfer comment: Min guard for safety. Increased SOB, so only took side steps at EOB with min guard A. Pt's oxygen sats maintained at 96-97% on 5L.    Balance Overall balance assessment: Needs assistance Sitting-balance support: No upper extremity supported;Feet supported Sitting balance-Leahy Scale: Good     Standing balance support: No upper extremity supported;During  functional activity Standing balance-Leahy Scale: Fair                             ADL either performed or assessed with clinical judgement   ADL Overall ADL's : Modified independent                                       General ADL Comments: Pt ad lib in room with tremors all over body. Pt able to feed himself and stand at sink for ADL tasks. Pt ambulatory in room and hallway, avoiding obstacles with ease. No LOB episodes, but cues to slow down for safety.     Vision Baseline Vision/History: No visual deficits Patient Visual Report: No change from baseline Vision Assessment?: No apparent visual deficits     Perception     Praxis      Pertinent Vitals/Pain Pain Assessment: Faces Faces Pain Scale: No hurt Pain Intervention(s): Monitored during session     Hand Dominance Right   Extremity/Trunk Assessment Upper Extremity Assessment Upper Extremity Assessment: Overall WFL for tasks assessed   Lower Extremity Assessment Lower Extremity Assessment: Overall WFL for tasks assessed   Cervical / Trunk Assessment Cervical / Trunk Assessment: Normal   Communication Communication Communication: HOH   Cognition Arousal/Alertness: Awake/alert Behavior During Therapy: WFL for tasks assessed/performed Overall Cognitive Status: Within Functional Limits for tasks assessed  General Comments: A/O x4 and pleasant   General Comments  2L O2 >92% throughout exertion. Education on energy conservation provided.    Exercises     Shoulder Instructions      Home Living Family/patient expects to be discharged to:: Private residence Living Arrangements: Alone Available Help at Discharge: Family;Available PRN/intermittently Type of Home: House Home Access: Stairs to enter [employer] of Steps: 2 Entrance Stairs-Rails: None Home Layout: One level     Bathroom Shower/Tub: Engineer, site: Standard     Home Equipment: None          Prior Functioning/Environment Level of Independence: Independent        Comments: drives, manages his household; confirms that sister still assists with medications        OT Problem List: Decreased activity tolerance      OT Treatment/Interventions:      OT Goals(Current goals can be found in the care plan section) Acute Rehab OT Goals Patient Stated Goal: to go home OT Goal Formulation: With patient  OT Frequency:     Barriers to D/C:            Co-evaluation              AM-PAC OT "6 Clicks" Daily Activity     Outcome Measure Help from another person eating meals?: None Help from another person taking care of personal grooming?: None Help from another person toileting, which includes using toliet, bedpan, or urinal?: None Help from another person bathing (including washing, rinsing, drying)?: None Help from another person to put on and taking off regular upper body clothing?: None Help from another person to put on and taking off regular lower body clothing?: None 6 Click Score: 24   End of Session Equipment Utilized During Treatment: Gait belt;Rolling walker;Oxygen Nurse Communication: Mobility status  Activity Tolerance: Patient tolerated treatment well Patient left: in bed;with call bell/phone within reach (per RN, pt has been ad lib so no alarm activated)  OT Visit Diagnosis: Unsteadiness on feet (R26.81)                Time: 8185-6314 OT Time Calculation (min): 21 min Charges:  OT General Charges $OT Visit: 1 Visit OT Evaluation $OT Eval Moderate Complexity: 1 Mod  Jefferey Pica, OTR/L Acute Rehabilitation Services Pager: [phone] Office: [phone]   [place] C 03/27/2020, 10:49 AM

## 2020-03-28 ENCOUNTER — Other Ambulatory Visit (HOSPITAL_COMMUNITY): Payer: Self-pay | Admitting: Internal Medicine

## 2020-03-28 DIAGNOSIS — J189 Pneumonia, unspecified organism: Secondary | ICD-10-CM | POA: Diagnosis not present

## 2020-03-28 DIAGNOSIS — J9621 Acute and chronic respiratory failure with hypoxia: Secondary | ICD-10-CM | POA: Diagnosis not present

## 2020-03-28 DIAGNOSIS — R778 Other specified abnormalities of plasma proteins: Secondary | ICD-10-CM | POA: Diagnosis not present

## 2020-03-28 DIAGNOSIS — J441 Chronic obstructive pulmonary disease with (acute) exacerbation: Secondary | ICD-10-CM | POA: Diagnosis not present

## 2020-03-28 LAB — BASIC METABOLIC PANEL
Anion gap: 8 (ref 5–15)
BUN: 23 mg/dL — ABNORMAL HIGH (ref 6–20)
CO2: 28 mmol/L (ref 22–32)
Calcium: 9.5 mg/dL (ref 8.9–10.3)
Chloride: 100 mmol/L (ref 98–111)
Creatinine, Ser: 0.8 mg/dL (ref 0.61–1.24)
GFR, Estimated: >60 mL/min (ref >60)
Glucose, Bld: 197 mg/dL — ABNORMAL HIGH (ref 70–99)
Potassium: 4.7 mmol/L (ref 3.5–5.1)
Sodium: 136 mmol/L (ref 135–145)

## 2020-03-28 LAB — GLUCOSE, CAPILLARY
Glucose-Capillary: 185 mg/dL — ABNORMAL HIGH (ref 70–99)
Glucose-Capillary: 193 mg/dL — ABNORMAL HIGH (ref 70–99)
Glucose-Capillary: 196 mg/dL — ABNORMAL HIGH (ref 70–99)

## 2020-03-28 LAB — CBC WITH DIFFERENTIAL/PLATELET
Abs Immature Granulocytes: 0.05 10*3/uL (ref 0.00–0.07)
Basophils Absolute: 0 10*3/uL (ref 0.0–0.1)
Basophils Relative: 0 %
Eosinophils Absolute: 0 10*3/uL (ref 0.0–0.5)
Eosinophils Relative: 0 %
HCT: 38.9 % — ABNORMAL LOW (ref 39.0–52.0)
Hemoglobin: 12.2 g/dL — ABNORMAL LOW (ref 13.0–17.0)
Immature Granulocytes: 0 %
Lymphocytes Relative: 7 %
Lymphs Abs: 0.8 10*3/uL (ref 0.7–4.0)
MCH: 28.8 pg (ref 26.0–34.0)
MCHC: 31.4 g/dL (ref 30.0–36.0)
MCV: 92 fL (ref 80.0–100.0)
Monocytes Absolute: 0.4 10*3/uL (ref 0.1–1.0)
Monocytes Relative: 4 %
Neutro Abs: 10.4 10*3/uL — ABNORMAL HIGH (ref 1.7–7.7)
Neutrophils Relative %: 89 %
Platelets: 249 10*3/uL (ref 150–400)
RBC: 4.23 MIL/uL (ref 4.22–5.81)
RDW: 13.3 % (ref 11.5–15.5)
WBC: 11.7 10*3/uL — ABNORMAL HIGH (ref 4.0–10.5)
nRBC: 0 % (ref 0.0–0.2)

## 2020-03-28 LAB — MAGNESIUM: Magnesium: 2 mg/dL (ref 1.7–2.4)

## 2020-03-28 MED ORDER — GUAIFENESIN ER 600 MG PO TB12: 600.0000 mg | ORAL_TABLET | Freq: Two times a day (BID) | ORAL | 0 refills | Status: DC

## 2020-03-28 MED ORDER — ALBUTEROL SULFATE (2.5 MG/3ML) 0.083% IN NEBU: 2.5000 mg | INHALATION_SOLUTION | Freq: Four times a day (QID) | RESPIRATORY_TRACT | Status: DC | PRN

## 2020-03-28 MED ORDER — THIAMINE HCL 100 MG PO TABS: 100.0000 mg | ORAL_TABLET | Freq: Every day | ORAL | 0 refills | Status: DC

## 2020-03-28 MED ORDER — FOLIC ACID 1 MG PO TABS: 1.0000 mg | ORAL_TABLET | Freq: Every day | ORAL | 0 refills | Status: DC

## 2020-03-28 MED ORDER — ADULT MULTIVITAMIN W/MINERALS CH: 1.0000 | ORAL_TABLET | Freq: Every day | ORAL | Status: DC

## 2020-03-28 MED ORDER — PREDNISONE 20 MG PO TABS: 40.0000 mg | ORAL_TABLET | Freq: Every day | ORAL | 0 refills | Status: DC

## 2020-03-28 MED ORDER — FUROSEMIDE 20 MG PO TABS: 20.0000 mg | ORAL_TABLET | Freq: Every day | ORAL | 0 refills | Status: DC | PRN

## 2020-03-28 MED ORDER — METFORMIN HCL 1000 MG PO TABS: 1000.0000 mg | ORAL_TABLET | Freq: Two times a day (BID) | ORAL | 0 refills | Status: DC

## 2020-03-28 MED ORDER — CEFUROXIME AXETIL 500 MG PO TABS: 500.0000 mg | ORAL_TABLET | Freq: Two times a day (BID) | ORAL | 0 refills | Status: DC

## 2020-03-28 MED FILL — METFORMIN HCL 1000 MG TABS: 1000 | 30 days supply | Qty: 60 | Fill #0

## 2020-03-28 MED FILL — FUROSEMIDE 20 MG TABS: 20 | 30 days supply | Qty: 30 | Fill #0

## 2020-03-28 MED FILL — CEFUROXIME AXETIL 500 MG TA: 500 | 5 days supply | Qty: 10 | Fill #0

## 2020-03-28 NOTE — Consult Note (Signed)
   [facility] CM Inpatient Consult   03/28/2020  James [name] 1960/08/02 [id number]   Patient chart reviewed for potential [place] Management [facility] CM) services due to high unplanned readmission risk score, 25%. Per review, patient is currently enrolled with Managed Medicaid care coordination services and active with RN case manager for outpatient support.  Will update outpatient case manager of patient disposition. No [facility] CM follow up.  Of note, [facility] Care Management services does not replace or interfere with any services that are arranged by inpatient case management or social work.  Netta Cedars, MSN, [facility] Liaison Nurse Mobile Phone [phone]  Toll free office [phone]

## 2020-03-28 NOTE — Plan of Care (Signed)
  Problem: Acute Rehab PT Goals(only PT should resolve) Goal: Patient Will Transfer Sit To/From Stand Outcome: Adequate for Discharge Goal: Pt Will Ambulate Outcome: Adequate for Discharge Goal: Pt Will Go Up/Down Stairs Outcome: Adequate for Discharge   Problem: Education: Goal: Knowledge of General Education information will improve Description: Including pain rating scale, medication(s)/side effects and non-pharmacologic comfort measures Outcome: Adequate for Discharge   Problem: Health Behavior/Discharge Planning: Goal: Ability to manage health-related needs will improve Outcome: Adequate for Discharge   Problem: Clinical Measurements: Goal: Ability to maintain clinical measurements within normal limits will improve Outcome: Adequate for Discharge Goal: Will remain free from infection Outcome: Adequate for Discharge Goal: Diagnostic test results will improve Outcome: Adequate for Discharge Goal: Respiratory complications will improve Outcome: Adequate for Discharge Goal: Cardiovascular complication will be avoided Outcome: Adequate for Discharge   Problem: Activity: Goal: Risk for activity intolerance will decrease Outcome: Adequate for Discharge   Problem: Nutrition: Goal: Adequate nutrition will be maintained Outcome: Adequate for Discharge   Problem: Coping: Goal: Level of anxiety will decrease Outcome: Adequate for Discharge   Problem: Elimination: Goal: Will not experience complications related to bowel motility Outcome: Adequate for Discharge Goal: Will not experience complications related to urinary retention Outcome: Adequate for Discharge   Problem: Pain Managment: Goal: General experience of comfort will improve Outcome: Adequate for Discharge   Problem: Safety: Goal: Ability to remain free from injury will improve Outcome: Adequate for Discharge   Problem: Skin Integrity: Goal: Risk for impaired skin integrity will decrease Outcome: Adequate for  Discharge

## 2020-03-28 NOTE — TOC Initial Note (Signed)
Transition of Care [facility]) - Initial/Assessment Note    Patient Details  Name: James [name] MRN: [id number] Date of Birth: [birth year]  [facility] CM/SW Contact:    Joanne Chars, LCSW Phone Number: 03/28/2020, 12:08 PM  Clinical Narrative: CSW met with pt regarding recommendation for [facility] services.  Pt is agreeable to this.  Permission given to speak with either of his two sisters.  Pt does have home O2 currently, unsure of provider, no other home services.  PCP at [place].  Pt is not vaccinated.    CSW made the following attempts to locate [facility] services: [facility]-not accepting medicaids currently [facility]: no  [facility]: no  [facility]: no response [facility]-does not have medicaid contract [facility]: no [facility]: no  CSW spoke with pt again and discussed possibility of outpt PT and pt declines this, states he is"doing OK" and will decline PT services.  CSW attempted to contact pt sister [name], no answer, left message. CSW did speak with pt sister Dory Larsen, who lives in [place].  She and her sister, who does live in [place], support pt as they can.  Pt does drive and sister helps with transportation as well.    PCP appt confirmed with [facility].  Pt reported he had an appt on the books.  Pt confirmed that he does drive self to appts.                    Expected Discharge Plan: [place] Services Barriers to Discharge: Other (comment) ([place] with medicaid payor)   Patient Goals and CMS Choice Patient states their goals for this hospitalization and ongoing recovery are:: "get back to a 10" CMS Medicare.gov Compare Post Acute Care list provided to:: Patient Choice offered to / list presented to : Patient  Expected Discharge Plan and Services Expected Discharge Plan: [place] Choice: [place] arrangements for the past 2 months: Single  Family Home Expected Discharge Date: 03/28/20               DME Arranged: Oxygen DME Agency: [facility] Date DME Agency Contacted: 03/28/20 Time DME Agency Contacted: [phone] Representative spoke with at DME Agency: Freda Munro            Prior Living Arrangements/Services Living arrangements for the past 2 months: [place] Lives with:: Self Patient language and need for interpreter reviewed:: Yes Do you feel safe going back to the place where you live?: Yes      Need for Family Participation in Patient Care: No (Comment) Care giver support system in place?: Yes (comment) Current home services: DME ([facility], Home O2) Criminal Activity/Legal Involvement Pertinent to Current Situation/Hospitalization: No - Comment as needed  Activities of Daily Living      Permission Sought/Granted Permission sought to share information with : Family Supports Permission granted to share information with : Yes, Verbal Permission Granted  Share Information with NAME: [name] and Cecille Rubin, sisters           Emotional Assessment Appearance:: Appears stated age Attitude/Demeanor/Rapport: Engaged Affect (typically observed): Appropriate,Pleasant Orientation: : Oriented to Self,Oriented to Place,Oriented to  Time,Oriented to Situation Alcohol / Substance Use: Not Applicable Psych Involvement: No (comment)  Admission diagnosis:  COPD exacerbation ([place]) [J44.1] COPD with acute exacerbation ([place]) [J44.1] Community acquired pneumonia, unspecified laterality [J18.9] Patient Active Problem List   Diagnosis Date Noted  . COPD with acute  exacerbation ([place]) 03/26/2020  . COPD exacerbation ([place]) 03/26/2020  . Elevated troponin 03/26/2020  . Pain of right forearm 12/03/2019  . Bronchiectasis ([place]) 09/24/2019  . Type 2 diabetes mellitus without complication, without long-term current use of insulin ([place]) 07/24/2019  . Other atopic dermatitis 05/01/2019  . Chronic respiratory failure with hypoxia ([place])  04/23/2019  . Intellectual disability 04/23/2019  . History of alcohol use 11/23/2018  . CAP (community acquired pneumonia) 05/17/2018  . Hypertension 05/17/2016  . History of tobacco abuse 04/06/2016  . COPD mixed type ([place]) 03/26/2016   PCP:  [name], MD Pharmacy:   [place], [address] [place] [address] Phone: [phone] Fax: [place], [place] [address] [place] [address] Phone: [phone] Fax: [phone]     Social Determinants of Health (SDOH) Interventions    Readmission Risk Interventions Readmission Risk Prevention Plan 03/28/2020 04/26/2019  Transportation Screening Complete Complete  PCP or Specialist Appt within 3-5 Days Not Complete Complete  Not Complete comments Pt had existing appt with [name] on 04/14/20. -  [place] or [place] Complete Complete  Social Work Consult for [place] Planning/Counseling Complete Complete  Palliative Care Screening Not Applicable Not Applicable  Medication Review (RN Care Manager) - Complete  Some recent data might be hidden

## 2020-03-28 NOTE — Progress Notes (Signed)
Physical Therapy Treatment Patient Details Name: James [name] MRN: [id number] DOB: [birth year] Today's Date: 03/28/2020    History of Present Illness Pt is a 60 y/o male admitted 3/23 secondary to increased SOB. Thought to be secondary to COPD exacerbation. PMH includes COPD on supplemental oxygen, DM, and HTN.    PT Comments    Pt with pleasant demeanor able to walk in hall on 2L and recognizes fatigue with seated rest. Pt reports being able to get out in the community but aware of where places are to rest and on 2-4L at home. Pt encouraged to have hand rail installed on stairs. Pt eager to return to chair and order breakfast end of session with pt stating he is now very near his baseline mobility status.     Follow Up Recommendations  Home health PT (would benefit balance but pt near his baseline)     Equipment Recommendations  None recommended by PT    Recommendations for Other Services       Precautions / Restrictions Precautions Precautions: Other (comment) Precaution Comments: O2; 2L at baseline Restrictions Weight Bearing Restrictions: No    Mobility  Bed Mobility Overal bed mobility: Modified Independent             General bed mobility comments: mod I with bed flat    Transfers Overall transfer level: Modified independent                  Ambulation/Gait Ambulation/Gait assistance: Modified independent (Device/Increase time) Gait Distance (Feet): 150 Feet Assistive device: None Gait Pattern/deviations: WFL(Within Functional Limits)   Gait velocity interpretation: 1.31 - 2.62 ft/sec, indicative of limited community ambulator General Gait Details: pt with controlled gait speed with pt able to walk 150' without assist then required seated rest due to fatigue prior to ambulating stairs and return gait to room. Pt with SpO2 95% on 2L throughout ambulation   Stairs Stairs: Yes Stairs assistance: Min guard Stair Management: Step to  pattern;Forwards Number of Stairs: 3 General stair comments: pt with hand on wall to ascend stairs and encouraged to have rail installed at home for safety   Wheelchair Mobility    Modified Rankin (Stroke Patients Only)       Balance Overall balance assessment: Mild deficits observed, not formally tested                                          Cognition Arousal/Alertness: Awake/alert Behavior During Therapy: WFL for tasks assessed/performed Overall Cognitive Status: Within Functional Limits for tasks assessed                                        Exercises      General Comments        Pertinent Vitals/Pain Pain Assessment: No/denies pain    Home Living                      Prior Function            PT Goals (current goals can now be found in the care plan section) Progress towards PT goals: Goals met and updated - see care plan    Frequency    Min 3X/week      PT Plan Current plan remains appropriate  Co-evaluation              AM-PAC PT "6 Clicks" Mobility   Outcome Measure  Help needed turning from your back to your side while in a flat bed without using bedrails?: None Help needed moving from lying on your back to sitting on the side of a flat bed without using bedrails?: None Help needed moving to and from a bed to a chair (including a wheelchair)?: None Help needed standing up from a chair using your arms (e.g., wheelchair or bedside chair)?: None Help needed to walk in hospital room?: A Little Help needed climbing 3-5 steps with a railing? : None 6 Click Score: 23    End of Session Equipment Utilized During Treatment: Oxygen;Gait belt Activity Tolerance: Patient tolerated treatment well Patient left: in chair;with call bell/phone within reach Nurse Communication: Mobility status PT Visit Diagnosis: Other abnormalities of gait and mobility (R26.89)     Time: 1753-0104 PT Time  Calculation (min) (ACUTE ONLY): 14 min  Charges:  $Gait Training: 8-22 mins                     James [name], PT Acute Rehabilitation Services Pager: [phone] Office: [phone]    James [name] 03/28/2020, 9:48 AM

## 2020-03-28 NOTE — Discharge Summary (Signed)
Physician Discharge Summary  James [name] BHA:[id number] DOB: [birth year] DOA: 03/26/2020  PCP: [name], MD  Admit date: 03/26/2020 Discharge date: 03/28/2020  Admitted From: Home Disposition: Home  Recommendations for Outpatient Follow-up:  1. Follow up with PCP in 1 week with repeat CBC/BMP 2. Outpatient follow-up with pulmonary 3. 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  On presentation, he required 5 L oxygen via nasal cannula; chest x-ray showed chronic bronchiectatic markings versus  bronchopneumonia at the basis.  He was started on Solu-Medrol and IV antibiotics.  During the hospitalization, his respiratory status has improved.  He is feeling better.  He wants to go home today.  He will be discharged home on oral prednisone and antibiotics.  Outpatient follow-up with PCP and pulmonary.   Discharge Diagnoses:   Acute on chronic hypoxic respiratory failure COPD exacerbation Probable community-acquired bacterial pneumonia -Patient normally wears 2 to 4 L oxygen at home.  He had COVID-19 infection in 10/2019 but is still not vaccinated against COVID-19 yet -Required 5 L oxygen via nasal cannula on presentation. chest x-ray showed chronic bronchiectatic markings versus  bronchopneumonia at the basis.  COVID-19 and influenza tests were negative -Respiratory status improving: Currently on 2-4 L oxygen via nasal  cannula. -Currently on Rocephin and Zithromax along with IV Solu-Medrol.  Procalcitonin was less than 0.1 -He is feeling better.  He wants to go home today.  -Discharge home today on prednisone 40 mg daily for 7 days and Ceftin for 5 days. -Outpatient follow-up with PCP and pulmonary.  Elevated troponin -High-sensitivity troponin did not trend upwards.  EKG without any ischemic changes.  No chest pain.  No further cardiac work-up needed  Diabetes mellitus type 2 -A1c 6.7.  Carb modified diet.  Continue home regimen.  Outpatient follow-up.  Hypertension -Continue metoprolol and losartan  History of alcohol abuse -Treated with CIWA protocol.  Continue thiamine, folic acid and multivitamin.  Patient needs to abstain from alcohol  Generalized deconditioning -Will need home health PT.  Discharge Instructions  Discharge Instructions    Ambulatory referral to Pulmonology   Complete by: As directed    Reason for referral: Asthma/COPD   Diet - low sodium heart healthy   Complete by: As directed    Diet Carb Modified   Complete by: As directed    Increase activity slowly   Complete by: As directed      Allergies as of 03/28/2020      Reactions   Gabapentin Other (See Comments)   hallucinations      Medication List    STOP taking these medications   terbinafine 250 MG tablet Commonly known as: LAMISIL     TAKE these medications   Accu-Chek Softclix Lancets lancets Use as instructed to check blood sugar once daily.   albuterol 108 (90 Base) MCG/ACT inhaler Commonly known as: VENTOLIN HFA Inhale 2 puffs into the lungs every 4 (four) hours as needed for wheezing or shortness of breath. What changed: Another medication with the same name  was changed. Make sure you understand how and when to take each.   albuterol (2.5 MG/3ML) 0.083% nebulizer solution Commonly known as: PROVENTIL Take 3 mLs (2.5 mg total) by nebulization every 6 (six) hours as needed for wheezing or  shortness of breath. What changed:   when to take this  reasons to take this   budesonide-formoterol 160-4.5 MCG/ACT inhaler Commonly known as: Symbicort Inhale 2 puffs into the lungs 2 (two) times daily.   cefUROXime 500 MG tablet Commonly known as: CEFTIN Take 1 tablet (500 mg total) by mouth 2 (two) times daily for 5 days.   Flutter Devi Use 4 times daily What changed:   how much to take  how to take this  when to take this   folic acid 1 MG tablet Commonly known as: FOLVITE Take 1 tablet (1 mg total) by mouth daily. Start taking on: March 29, 2020   furosemide 20 MG tablet Commonly known as: LASIX Take 1 tablet (20 mg total) by mouth daily as needed for edema (lower extremity).   guaiFENesin 600 MG 12 hr tablet Commonly known as: MUCINEX Take 1 tablet (600 mg total) by mouth 2 (two) times daily.   losartan 100 MG tablet Commonly known as: COZAAR Take 1 tablet (100 mg total) by mouth daily.   metFORMIN 1000 MG tablet Commonly known as: Glucophage Take 1 tablet (1,000 mg total) by mouth 2 (two) times daily with a meal. What changed: medication strength   metoprolol succinate 25 MG 24 hr tablet Commonly known as: TOPROL-XL Take 1 tablet (25 mg total) by mouth daily.   multivitamin with minerals Tabs tablet Take 1 tablet by mouth daily. Start taking on: March 29, 2020   pantoprazole 40 MG tablet Commonly known as: PROTONIX Take 1 tablet (40 mg total) by mouth daily.   predniSONE 20 MG tablet Commonly known as: DELTASONE Take 2 tablets (40 mg total) by mouth daily with breakfast for 7 days. What changed: how much to take   Spiriva Respimat 2.5 MCG/ACT Aers Generic drug: Tiotropium Bromide Monohydrate Inhale 2 puffs into the lungs daily.   thiamine 100 MG tablet Take 1 tablet (100 mg total) by mouth daily. Start taking on: March 29, 2020   triamcinolone 0.1 % Commonly known as: KENALOG Apply 1 application topically 2 (two) times daily.   True  Metrix Blood Glucose Test test strip Generic drug: glucose blood Use as instructed   True Metrix Meter w/Device Kit Use to measure blood sugar twice a day   Accu-Chek Guide Me w/Device Kit 1 each by Other route in the morning and at bedtime.       Follow-up Information    [name], MD. Schedule an appointment as soon as possible for a visit in 1 week(s).   Specialty: Pulmonary Disease Why: With repeat CBC/BMP Contact information: [address] [address] [phone]        O'Neal, Cassie Freer, MD .   Specialties: Internal Medicine, Cardiology, Radiology Contact information: [address] [place] [address] [phone]              Allergies  Allergen Reactions  . Gabapentin Other (See Comments)    hallucinations    Consultations:  None   Procedures/Studies: DG Chest Portable 1 View  Result Date: 03/26/2020 CLINICAL DATA:  Shortness of breath EXAM: PORTABLE CHEST 1 VIEW COMPARISON:  11/16/2019 FINDINGS: Increased markings at the bases with apical lucency. Normal heart size. No edema, effusion, or pneumothorax.  Multiple remote bilateral rib fractures. IMPRESSION: 1. Chronic bronchitic markings versus bronchopneumonia at the bases. 2. Emphysema. Electronically Signed   By: Monte Fantasia M.D.   On: 03/26/2020 05:04       Subjective: Patient seen and examined at bedside.  Feels better and wants to go home today.  Denies worsening shortness of breath, fever, nausea or vomiting.  Discharge Exam: Vitals:   03/28/20 0723 03/28/20 0837  BP: 132/75   Pulse: 68 79  Resp: 16 (!) 22  Temp: 98 F (36.7 C)   SpO2: 98% 99%    General: Pt is alert, awake, not in acute distress.  Chronically ill looking and deconditioned gentleman sitting on chair.  Currently on 2 L oxygen via nasal cannula Cardiovascular: rate controlled, S1/S2 + Respiratory: bilateral decreased breath sounds at bases with some scattered crackles;  intermittently tachypneic Abdominal: Soft, NT, ND, bowel sounds + Extremities: Trace lower extremity edema; no cyanosis    The results of significant diagnostics from this hospitalization (including imaging, microbiology, ancillary and laboratory) are listed below for reference.     Microbiology: Recent Results (from the past 240 hour(s))  Resp Panel by RT-PCR (Flu A&B, Covid) Nasopharyngeal Swab     Status: None   Collection Time: 03/26/20  5:01 AM   Specimen: Nasopharyngeal Swab; Nasopharyngeal(NP) swabs in vial transport medium  Result Value Ref Range Status   SARS Coronavirus 2 by RT PCR NEGATIVE NEGATIVE Final    Comment: (NOTE) SARS-CoV-2 target nucleic acids are NOT DETECTED.  The SARS-CoV-2 RNA is generally detectable in upper respiratory specimens during the acute phase of infection. The lowest concentration of SARS-CoV-2 viral copies this assay can detect is 138 copies/mL. A negative result does not preclude SARS-Cov-2 infection and should not be used as the sole basis for treatment or other patient management decisions. A negative result may occur with  improper specimen collection/handling, submission of specimen other than nasopharyngeal swab, presence of viral mutation(s) within the areas targeted by this assay, and inadequate number of viral copies(<138 copies/mL). A negative result must be combined with clinical observations, patient history, and epidemiological information. The expected result is Negative.  Fact Sheet for Patients:  EntrepreneurPulse.com.au  Fact Sheet for Healthcare Providers:  IncredibleEmployment.be  This test is no t yet approved or cleared by the [place] FDA and  has been authorized for detection and/or diagnosis of SARS-CoV-2 by FDA under an Emergency Use Authorization (EUA). This EUA will remain  in effect (meaning this test can be used) for the duration of the COVID-19 declaration under  Section 564(b)(1) of the Act, 21 U.S.C.section 360bbb-3(b)(1), unless the authorization is terminated  or revoked sooner.       Influenza A by PCR NEGATIVE NEGATIVE Final   Influenza B by PCR NEGATIVE NEGATIVE Final    Comment: (NOTE) The Xpert Xpress SARS-CoV-2/FLU/RSV plus assay is intended as an aid in the diagnosis of influenza from Nasopharyngeal swab specimens and should not be used as a sole basis for treatment. Nasal washings and aspirates are unacceptable for Xpert Xpress SARS-CoV-2/FLU/RSV testing.  Fact Sheet for Patients: EntrepreneurPulse.com.au  Fact Sheet for Healthcare Providers: IncredibleEmployment.be  This test is not yet approved or cleared by the [place] FDA and has been authorized for detection and/or diagnosis of SARS-CoV-2 by FDA under an Emergency Use Authorization (EUA). This EUA will remain in effect (meaning this test can be used) for the duration of the COVID-19 declaration under Section 564(b)(1) of the Act, 21 U.S.C. section 360bbb-3(b)(1), unless  the authorization is terminated or revoked.  Performed at [facility], [place] [address] [place], [place] [address]      Labs: BNP (last 3 results) Recent Labs    04/23/19 0416 10/19/19 0235 03/26/20 0441  BNP 58.3 123.3* 259.5*   Basic Metabolic Panel: Recent Labs  Lab 03/26/20 0507 03/27/20 0141 03/28/20 0231  NA 144 138 136  K 4.0 4.5 4.7  CL 105 101 100  CO2  --  30 28  GLUCOSE 150* 195* 197*  BUN 12 16 23*  CREATININE 0.80 0.80 0.80  CALCIUM  --  9.7 9.5  MG  --  1.9 2.0  PHOS  --  3.9  --    Liver Function Tests: Recent Labs  Lab 03/27/20 0141  AST 17  ALT 20  ALKPHOS 33*  BILITOT 0.3  PROT 5.9*  ALBUMIN 3.5   No results for input(s): LIPASE, AMYLASE in the last 168 hours. No results for input(s): AMMONIA in the last 168 hours. CBC: Recent Labs  Lab 03/26/20 0441 03/26/20 0507 03/27/20 0141 03/28/20 0231  WBC 8.9   --  11.7* 11.7*  NEUTROABS 5.0  --   --  10.4*  HGB 12.4* 13.3 12.1* 12.2*  HCT 38.8* 39.0 38.9* 38.9*  MCV 91.7  --  91.7 92.0  PLT 276  --  269 249   Cardiac Enzymes: No results for input(s): CKTOTAL, CKMB, CKMBINDEX, TROPONINI in the last 168 hours. BNP: Invalid input(s): POCBNP CBG: Recent Labs  Lab 03/27/20 1149 03/27/20 1632 03/27/20 2107 03/28/20 0019 03/28/20 0724  GLUCAP 141* 149* 176* 196* 185*   D-Dimer No results for input(s): DDIMER in the last 72 hours. Hgb A1c Recent Labs    03/26/20 0840  HGBA1C 6.7*   Lipid Profile No results for input(s): CHOL, HDL, LDLCALC, TRIG, CHOLHDL, LDLDIRECT in the last 72 hours. Thyroid function studies No results for input(s): TSH, T4TOTAL, T3FREE, THYROIDAB in the last 72 hours.  Invalid input(s): FREET3 Anemia work up No results for input(s): VITAMINB12, FOLATE, FERRITIN, TIBC, IRON, RETICCTPCT in the last 72 hours. Urinalysis    Component Value Date/Time   COLORURINE YELLOW 07/04/2019 1220   APPEARANCEUR CLEAR 07/04/2019 1220   LABSPEC 1.021 07/04/2019 1220   PHURINE 5.0 07/04/2019 1220   GLUCOSEU >=500 (A) 07/04/2019 1220   HGBUR NEGATIVE 07/04/2019 1220   BILIRUBINUR NEGATIVE 07/04/2019 1220   BILIRUBINUR negative 09/25/2018 1432   KETONESUR NEGATIVE 07/04/2019 1220   PROTEINUR NEGATIVE 07/04/2019 1220   UROBILINOGEN 0.2 02/19/2019 1029   NITRITE NEGATIVE 07/04/2019 1220   LEUKOCYTESUR NEGATIVE 07/04/2019 1220   Sepsis Labs Invalid input(s): PROCALCITONIN,  WBC,  LACTICIDVEN Microbiology Recent Results (from the past 240 hour(s))  Resp Panel by RT-PCR (Flu A&B, Covid) Nasopharyngeal Swab     Status: None   Collection Time: 03/26/20  5:01 AM   Specimen: Nasopharyngeal Swab; Nasopharyngeal(NP) swabs in vial transport medium  Result Value Ref Range Status   SARS Coronavirus 2 by RT PCR NEGATIVE NEGATIVE Final    Comment: (NOTE) SARS-CoV-2 target nucleic acids are NOT DETECTED.  The SARS-CoV-2 RNA is  generally detectable in upper respiratory specimens during the acute phase of infection. The lowest concentration of SARS-CoV-2 viral copies this assay can detect is 138 copies/mL. A negative result does not preclude SARS-Cov-2 infection and should not be used as the sole basis for treatment or other patient management decisions. A negative result may occur with  improper specimen collection/handling, submission of specimen other than nasopharyngeal swab,  presence of viral mutation(s) within the areas targeted by this assay, and inadequate number of viral copies(<138 copies/mL). A negative result must be combined with clinical observations, patient history, and epidemiological information. The expected result is Negative.  Fact Sheet for Patients:  EntrepreneurPulse.com.au  Fact Sheet for Healthcare Providers:  IncredibleEmployment.be  This test is no t yet approved or cleared by the [place] FDA and  has been authorized for detection and/or diagnosis of SARS-CoV-2 by FDA under an Emergency Use Authorization (EUA). This EUA will remain  in effect (meaning this test can be used) for the duration of the COVID-19 declaration under Section 564(b)(1) of the Act, 21 U.S.C.section 360bbb-3(b)(1), unless the authorization is terminated  or revoked sooner.       Influenza A by PCR NEGATIVE NEGATIVE Final   Influenza B by PCR NEGATIVE NEGATIVE Final    Comment: (NOTE) The Xpert Xpress SARS-CoV-2/FLU/RSV plus assay is intended as an aid in the diagnosis of influenza from Nasopharyngeal swab specimens and should not be used as a sole basis for treatment. Nasal washings and aspirates are unacceptable for Xpert Xpress SARS-CoV-2/FLU/RSV testing.  Fact Sheet for Patients: EntrepreneurPulse.com.au  Fact Sheet for Healthcare Providers: IncredibleEmployment.be  This test is not yet approved or cleared by the [place] FDA and has been authorized for detection and/or diagnosis of SARS-CoV-2 by FDA under an Emergency Use Authorization (EUA). This EUA will remain in effect (meaning this test can be used) for the duration of the COVID-19 declaration under Section 564(b)(1) of the Act, 21 U.S.C. section 360bbb-3(b)(1), unless the authorization is terminated or revoked.  Performed at [facility], [place] [address] [place], [place] [address]      Time coordinating discharge: 35 minutes  SIGNED:   [name], MD  [facility] 03/28/2020, 9:37 AM

## 2020-03-28 NOTE — Care Management Important Message (Signed)
Important Message  Patient Details  Name: James [name] MRN: [id number] Date of Birth: 1960-02-17   Medicare Important Message Given:  Yes     [name] 03/28/2020, 3:49 PM

## 2020-03-31 ENCOUNTER — Telehealth: Payer: Self-pay | Admitting: *Deleted

## 2020-03-31 ENCOUNTER — Telehealth: Payer: Self-pay

## 2020-03-31 NOTE — Telephone Encounter (Signed)
Transition Care Management Follow-up Telephone Call Call placed to patient # [phone], voicemail full, unable to leave a message. Call then placed to patient's sister, James [name].   Date of discharge and from where: 03/28/2020, [facility]   How have you been since you were released from the hospital?  She said he is okay and was supposed to pick up his antibiotic today.    Any questions or concerns? Yes - she remains concerned about his general health and he refuses to listen to recommendations about improving his health.  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   James [name] sees him almost every day.   Items Reviewed:  Did the pt receive and understand the discharge instructions provided? Yes  - James [name] said that she had them but Mason took them home today.   Medications obtained and verified? teresa said that he has all of the medications he was taking prior to his hospitalization.  He was supposed to pick up the new medications, including the antibiotic, today at [place] but she was not sure that he picked them up. She said that he uses the medication list on the AVS and uses the check marks on the med list to remind him when to take each medication. James [name] explained that the order for prednisone is 40 mg daily x 7 days however, he will continue to take it after that and she also noted that at times,  he will take more than the prescribed amount  Per Opal Sidles, RPH/[facility], he picked up the ceftin, furosemide and guaifenesin today.   Other? No   Any new allergies since your discharge? No   Do you have support at home? Yes .  He lives alone but James [name] lives close by.   He receives services from the [employer].  He has a nebulizer and glucometer however, he does not use the glucometer regularly. He has O2 at home that he uses continuously.  James [name] said that he " turns it all the way up."  Home Care and  Equipment/Supplies: Were home health services ordered? no If so, what is the name of the agency? n/a  Has the agency set up a time to come to the patient's home? not applicable Were any new equipment or medical supplies ordered?  No What is the name of the medical supply agency? n/a Were you able to get the supplies/equipment? not applicable Do you have any questions related to the use of the equipment or supplies? No  Functional Questionnaire: (I = Independent and D = Dependent) ADLs: independent. His sister assists with medication management  Follow up appointments reviewed:   PCP Hospital f/u appt confirmed? Yes  - [name] - 04/14/2020.   [name] f/u appt confirmed? No , none scheduled at this time  Are transportation arrangements needed? No   If their condition worsens, is the pt aware to call PCP or go to the Emergency Dept.? Yes  Was the patient provided with contact information for the PCP's office or ED? Yes  Was to pt encouraged to call back with questions or concerns? Yes

## 2020-03-31 NOTE — Telephone Encounter (Signed)
Transition Care Management Follow-up Telephone Call  Date of discharge and from where: 03/28/2020 - [facility]  How have you been since you were released from the hospital? "I'm doing"  Any questions or concerns? No  Items Reviewed:  Did the pt receive and understand the discharge instructions provided? Yes   Medications obtained and verified? Yes   Other? No   Any new allergies since your discharge? No   Dietary orders reviewed? No  Do you have support at home? Yes   Home Care and Equipment/Supplies: Were home health services ordered? not applicable If so, what is the name of the agency? N/A  Has the agency set up a time to come to the patient's home? not applicable Were any new equipment or medical supplies ordered?  No What is the name of the medical supply agency? N/A Were you able to get the supplies/equipment? not applicable Do you have any questions related to the use of the equipment or supplies? No  Functional Questionnaire: (I = Independent and D = Dependent) ADLs: I  Bathing/Dressing- I  Meal Prep- I  Eating- I  Maintaining continence- I  Transferring/Ambulation- I  Managing Meds- I  Follow up appointments reviewed:   PCP Hospital f/u appt confirmed? Yes  Scheduled to see [name] on 04/14/2020 @ 1000.  [facility] f/u appt confirmed? No    Are transportation arrangements needed? No   If their condition worsens, is the pt aware to call PCP or go to the Emergency Dept.? Yes  Was the patient provided with contact information for the PCP's office or ED? Yes  Was to pt encouraged to call back with questions or concerns? Yes

## 2020-04-01 ENCOUNTER — Telehealth: Payer: Self-pay | Admitting: Critical Care Medicine

## 2020-04-01 NOTE — Telephone Encounter (Signed)
Attempted to reach James [name] to get him scheduled for a phone visit with the Managed Medicaid team. No one answered and his VM was not set up.

## 2020-04-02 ENCOUNTER — Telehealth: Payer: Self-pay | Admitting: Critical Care Medicine

## 2020-04-02 NOTE — Telephone Encounter (Signed)
Pts sister, calling on behalf of pt, stating that the antibiotic that the pt was prescribed in the hospital is causing the pt to become nauseated and vomit. She is requesting to have another antibiotic sent in for pt that wont make him sick. Please advise.      [place], [place] [address]  [place] [address]  Phone: [phone] Fax: [phone]  Hours: Not open 24 hours

## 2020-04-03 ENCOUNTER — Other Ambulatory Visit: Payer: Self-pay | Admitting: Family Medicine

## 2020-04-03 MED ORDER — AZITHROMYCIN 250 MG PO TABS: ORAL_TABLET | ORAL | 0 refills | Status: DC

## 2020-04-03 NOTE — Telephone Encounter (Signed)
I have sent a different antibiotic to his pharmacy.

## 2020-04-03 NOTE — Telephone Encounter (Signed)
Returned sister call to get more information per Pt sister she doesn't know the antibiotic that he is taking. Pt states he picked up medications or Monday from the pharmacy. Went back and looked at discharge note "Discharge home today on prednisone 40 mg daily for 7 days and Ceftin for 5 days."  Made pt sister that I will reach out to provider and will get back to them once I get a response

## 2020-04-04 DIAGNOSIS — J9601 Acute respiratory failure with hypoxia: Secondary | ICD-10-CM | POA: Diagnosis not present

## 2020-04-04 DIAGNOSIS — F79 Unspecified intellectual disabilities: Secondary | ICD-10-CM | POA: Diagnosis not present

## 2020-04-04 DIAGNOSIS — J9621 Acute and chronic respiratory failure with hypoxia: Secondary | ICD-10-CM | POA: Diagnosis not present

## 2020-04-04 MED FILL — AZITHROMYCIN 250 MG TABLET: 250 | 5 days supply | Qty: 6 | Fill #0

## 2020-04-04 NOTE — Telephone Encounter (Signed)
Returned pt sister call and made aware that provider has sent in a different antibiotic. Per sister she will go pick up rx today

## 2020-04-05 ENCOUNTER — Other Ambulatory Visit: Payer: Self-pay

## 2020-04-09 ENCOUNTER — Other Ambulatory Visit: Payer: Self-pay | Admitting: *Deleted

## 2020-04-09 ENCOUNTER — Other Ambulatory Visit: Payer: Self-pay

## 2020-04-09 NOTE — Patient Instructions (Signed)
Visit Information  James [name] was given information about Medicaid Managed Care team care coordination services as a part of their Healthy [facility] Medicaid benefit. James [name] verbally consented to engagement with the [facility] Managed Care team.   For questions related to your Healthy [facility] health plan, please call: [phone] or visit the homepage here: [email or web address]  If you would like to schedule transportation through your Healthy [facility] plan, please call the following number at least 2 days in advance of your appointment: [phone]   Call the [place] at [phone], at any time, 24 hours a day, 7 days a week. If you are in danger or need immediate medical attention call 911.  James [name] - following are the goals we discussed in your visit today:  Goals Addressed            This Visit's Progress   . Manage Fatigue (Tiredness-COPD)       Follow Up Date 04/16/20   - develop a new routine to improve sleep - get at least 7 to 8 hours of sleep at night - keep room cool and dark - practice relaxation or meditation daily    Why is this important?    Feeling tired or worn out is a common symptom of COPD (chronic obstructive pulmonary disease).   Learning when you feel your best and when you need rest is important.   Managing the tiredness (fatigue) will help you be active and enjoy life.                               . Track and Manage My Triggers-COPD       Timeframe:  Long-Range Goal Priority:  High Start Date:  04/09/20                           Expected End Date:   06/09/20                    Follow Up Date 04/16/20   - avoid second hand smoke - identify and remove indoor air pollutants - limit outdoor activity during cold weather - listen for public air quality announcements every day  - take prescribed medications as directed - call PCP with new concerns or questions   Why is this  important?    Triggers are activities or things, like tobacco smoke or cold weather, that make your COPD (chronic obstructive pulmonary disease) flare-up.   Knowing these triggers helps you plan how to stay away from them.   When you cannot remove them, you can learn how to manage them.            Please see education materials related to exercise and diet provided by MyChart link. and as [employer] account planner.     Heart-Healthy Eating Plan Many factors influence your heart (coronary) health, including eating and exercise habits. Coronary risk increases with abnormal blood fat (lipid) levels. Heart-healthy meal planning includes limiting unhealthy fats, increasing healthy fats, and making other diet and lifestyle changes. What are tips for following this plan? Cooking Cook foods using methods other than frying. Baking, boiling, grilling, and broiling are all good options. Other ways to reduce fat include:  Removing the skin from poultry.  Removing all visible fats from meats.  Steaming vegetables in water or broth. Meal planning  At meals, imagine dividing your plate into  fourths: ? Fill one-half of your plate with vegetables and green salads. ? Fill one-fourth of your plate with whole grains. ? Fill one-fourth of your plate with lean protein foods.  Eat 4-5 servings of vegetables per day. One serving equals 1 cup raw or cooked vegetable, or 2 cups raw leafy greens.  Eat 4-5 servings of fruit per day. One serving equals 1 medium whole fruit,  cup dried fruit,  cup fresh, frozen, or canned fruit, or  cup 100% fruit juice.  Eat more foods that contain soluble fiber. Examples include apples, broccoli, carrots, beans, peas, and barley. Aim to get 25-30 g of fiber per day.  Increase your consumption of legumes, nuts, and seeds to 4-5 servings per week. One serving of dried beans or legumes equals  cup cooked, 1 serving of nuts is  cup, and 1 serving of seeds equals 1 tablespoon.    Fats  Choose healthy fats more often. Choose monounsaturated and polyunsaturated fats, such as olive and canola oils, flaxseeds, walnuts, almonds, and seeds.  Eat more omega-3 fats. Choose salmon, mackerel, sardines, tuna, flaxseed oil, and ground flaxseeds. Aim to eat fish at least 2 times each week.  Check food labels carefully to identify foods with trans fats or high amounts of saturated fat.  Limit saturated fats. These are found in animal products, such as meats, butter, and cream. Plant sources of saturated fats include palm oil, palm kernel oil, and coconut oil.  Avoid foods with partially hydrogenated oils in them. These contain trans fats. Examples are stick margarine, some tub margarines, cookies, crackers, and other baked goods.  Avoid fried foods. General information  Eat more home-cooked food and less restaurant, buffet, and fast food.  Limit or avoid alcohol.  Limit foods that are high in starch and sugar.  Lose weight if you are overweight. Losing just 5-10% of your body weight can help your overall health and prevent diseases such as diabetes and heart disease.  Monitor your salt (sodium) intake, especially if you have high blood pressure. Talk with your health care provider about your sodium intake.  Try to incorporate more vegetarian meals weekly. What foods can I eat? Fruits All fresh, canned (in natural juice), or frozen fruits. Vegetables Fresh or frozen vegetables (raw, steamed, roasted, or grilled). Green salads. Grains Most grains. Choose whole wheat and whole grains most of the time. Rice and pasta, including brown rice and pastas made with whole wheat. Meats and other proteins Lean, well-trimmed beef, veal, pork, and lamb. Chicken and [place] without skin. All fish and shellfish. Wild duck, rabbit, pheasant, and venison. Egg whites or low-cholesterol egg substitutes. Dried beans, peas, lentils, and tofu. Seeds and most nuts. Dairy Low-fat or nonfat  cheeses, including ricotta and mozzarella. Skim or 1% milk (liquid, powdered, or evaporated). Buttermilk made with low-fat milk. Nonfat or low-fat yogurt. Fats and oils Non-hydrogenated (trans-free) margarines. Vegetable oils, including soybean, sesame, sunflower, olive, peanut, safflower, corn, canola, and cottonseed. Salad dressings or mayonnaise made with a vegetable oil. Beverages Water (mineral or sparkling). Coffee and tea. Diet carbonated beverages. Sweets and desserts Sherbet, gelatin, and fruit ice. Small amounts of dark chocolate. Limit all sweets and desserts. Seasonings and condiments All seasonings and condiments. The items listed above may not be a complete list of foods and beverages you can eat. Contact a dietitian for more options. What foods are not recommended? Fruits Canned fruit in heavy syrup. Fruit in cream or butter sauce. Fried fruit. Limit coconut. Vegetables Vegetables cooked  in cheese, cream, or butter sauce. Fried vegetables. Grains Breads made with saturated or trans fats, oils, or whole milk. Croissants. Sweet rolls. Donuts. High-fat crackers, such as cheese crackers. Meats and other proteins Fatty meats, such as hot dogs, ribs, sausage, bacon, rib-eye roast or steak. High-fat deli meats, such as salami and bologna. Caviar. Domestic duck and goose. Organ meats, such as liver. Dairy Cream, sour cream, cream cheese, and creamed cottage cheese. Whole milk cheeses. Whole or 2% milk (liquid, evaporated, or condensed). Whole buttermilk. Cream sauce or high-fat cheese sauce. Whole-milk yogurt. Fats and oils Meat fat, or shortening. Cocoa butter, hydrogenated oils, palm oil, coconut oil, palm kernel oil. Solid fats and shortenings, including bacon fat, salt pork, lard, and butter. Nondairy cream substitutes. Salad dressings with cheese or sour cream. Beverages Regular sodas and any drinks with added sugar. Sweets and desserts Frosting. Pudding. Cookies. Cakes. Pies.  Milk chocolate or white chocolate. Buttered syrups. Full-fat ice cream or ice cream drinks. The items listed above may not be a complete list of foods and beverages to avoid. Contact a dietitian for more information. Summary  Heart-healthy meal planning includes limiting unhealthy fats, increasing healthy fats, and making other diet and lifestyle changes.  Lose weight if you are overweight. Losing just 5-10% of your body weight can help your overall health and prevent diseases such as diabetes and heart disease.  Focus on eating a balance of foods, including fruits and vegetables, low-fat or nonfat dairy, lean protein, nuts and legumes, whole grains, and heart-healthy oils and fats. This information is not intended to replace advice given to you by your health care provider. Make sure you discuss any questions you have with your health care provider. Document Revised: 01/28/2017 Document Reviewed: 01/28/2017 Elsevier Patient Education  2021 [place].  COPD and Physical Activity Chronic obstructive pulmonary disease (COPD) is a long-term (chronic) condition that affects the lungs. COPD is a general term that can be used to describe many different lung problems that cause lung swelling (inflammation) and limit airflow, including chronic bronchitis and emphysema. The main symptom of COPD is shortness of breath, which makes it harder to do even simple tasks. This can also make it harder to exercise and be active. Talk with your health care provider about treatments to help you breathe better and actions you can take to prevent breathing problems during physical activity. What are the benefits of exercising with COPD? Exercising regularly is an important part of a healthy lifestyle. You can still exercise and do physical activities even though you have COPD. Exercise and physical activity improve your shortness of breath by increasing blood flow (circulation). This causes your heart to pump more  oxygen through your body. Moderate exercise can improve your:  Oxygen use.  Energy level.  Shortness of breath.  Strength in your breathing muscles.  Heart health.  Sleep.  Self-esteem and feelings of self-worth.  Depression, stress, and anxiety levels. Exercise can benefit everyone with COPD. The severity of your disease may affect how hard you can exercise, especially at first, but everyone can benefit. Talk with your health care provider about how much exercise is safe for you, and which activities and exercises are safe for you.   What actions can I take to prevent breathing problems during physical activity?  Sign up for a pulmonary rehabilitation program. This type of program may include: ? Education about lung diseases. ? Exercise classes that teach you how to exercise and be more active while improving your  breathing. This usually involves:  Exercise using your lower extremities, such as a stationary bicycle.  About 30 minutes of exercise, 2 to 5 times per week, for 6 to 12 weeks  Strength training, such as push ups or leg lifts. ? Nutrition education. ? Group classes in which you can talk with others who also have COPD and learn ways to manage stress.  If you use an oxygen tank, you should use it while you exercise. Work with your health care provider to adjust your oxygen for your physical activity. Your resting flow rate is different from your flow rate during physical activity.  While you are exercising: ? Take slow breaths. ? Pace yourself and do not try to go too fast. ? Purse your lips while breathing out. Pursing your lips is similar to a kissing or whistling position. ? If doing exercise that uses a quick burst of effort, such as weight lifting:  Breathe in before starting the exercise.  Breathe out during the hardest part of the exercise (such as raising the weights). Where to find support You can find support for exercising with COPD from:  Your health  care provider.  A pulmonary rehabilitation program.  Your local health department or community health programs.  Support groups, online or in-person. Your health care provider may be able to recommend support groups. Where to find more information You can find more information about exercising with COPD from:  American Lung Association: ClassInsider.se.  COPD Foundation: https://www.rivera.net/. Contact a health care provider if:  Your symptoms get worse.  You have chest pain.  You have nausea.  You have a fever.  You have trouble talking or catching your breath.  You want to start a new exercise program or a new activity. Summary  COPD is a general term that can be used to describe many different lung problems that cause lung swelling (inflammation) and limit airflow. This includes chronic bronchitis and emphysema.  Exercise and physical activity improve your shortness of breath by increasing blood flow (circulation). This causes your heart to provide more oxygen to your body.  Contact your health care provider before starting any exercise program or new activity. Ask your health care provider what exercises and activities are safe for you. This information is not intended to replace advice given to you by your health care provider. Make sure you discuss any questions you have with your health care provider. Document Revised: 04/12/2018 Document Reviewed: 01/13/2017 Elsevier Patient Education  2021 [employer].   The patient verbalized understanding of instructions provided today and agreed to receive a mailed copy of patient instruction and/or educational materials.  Telephone follow up appointment with Managed Medicaid care management team member scheduled for:04/16/20 @ 3:30pm  [name] RN, BSN [place] RN Care Coordinator   Following is a copy of your plan of care:  Patient Care Plan: COPD (Adult)    Problem Identified: Symptom  Exacerbation (COPD)     Long-Range Goal: Symptom Exacerbation Prevented or Minimized   Start Date: 04/09/2020  Expected End Date: 06/09/2020  This Visit's Progress: On track  Priority: High  Note:   Current Barriers:  [name] Knowledge deficits related to basic COPD self care/management-James [name] was recently admitted for COPD exacerbation and pnuemonia. He reports taking his medications, wearing oxygen continuously, and has a follow up with his PCP on 04/14/20. He does report having difficulty at times swallowing food and liquid, pain in back and legs, and difficulty falling asleep.  He is planning to discuss these issues with his PCP. James [name] reports having transportation and plenty of food. He lives alone, and has support from his sister.  . Does not contact provider office for questions/concerns  Case Manager Clinical Goal(s):  patient will report using inhalers as prescribed including rinsing mouth after use  patient will engage in lite exercise as tolerated to build/regain stamina and strength and reduce shortness of breath through activity tolerance  patient will verbalize basic understanding of COPD disease process and self care activities  patient will not be hospitalized for COPD exacerbation as evidenced  Interventions:  . Inter-disciplinary care team collaboration (see longitudinal plan of care)  Provided patient with basic written and verbal COPD education on self care/management/and exacerbation prevention   Provided instruction about proper use of medications used for management of COPD including inhalers  Provided patient with education about the role of exercise in the management of COPD  Advised patient to engage in light exercise as tolerated 3-5 days a week  Provided education about and advised patient to utilize infection prevention strategies to reduce risk of respiratory infection  Patient Goals/Self-Care Activities:  . - avoid second hand smoke . - identify and  remove indoor air pollutants . - limit outdoor activity during cold weather . - listen for public air quality announcements every day  . - take prescribed medications as directed . - call PCP with new concerns or questions . - develop a new routine to improve sleep . - get at least 7 to 8 hours of sleep at night . - keep room cool and dark . - practice relaxation or meditation daily  Follow Up Plan: Telephone follow up appointment with care management team member scheduled for:04/16/20 @ 3:30pm

## 2020-04-09 NOTE — Patient Outreach (Signed)
Medicaid Managed Care   Nurse Care Manager Note  04/09/2020 Name:  James [name] MRN:  [id number] DOB:  June 20, 1960  James [name] is an 60 y.o. year old male who is a primary patient of James Gaskins Burnett Harry, MD.  The [facility] Managed Care Coordination team was consulted for assistance with:    COPD  James [name] was given information about Medicaid Managed Care Coordination team services today. James [name] agreed to services and verbal consent obtained.  Engaged with patient by telephone for follow up visit in response to provider referral for case management and/or care coordination services.   Assessments/Interventions:  Review of past medical history, allergies, medications, health status, including review of consultants reports, laboratory and other test data, was performed as part of comprehensive evaluation and provision of chronic care management services.  SDOH (Social Determinants of Health) assessments and interventions performed:   Care Plan  Allergies  Allergen Reactions  . Gabapentin Other (See Comments)    hallucinations    Medications Reviewed Today    Reviewed by James Montane, RN (Registered Nurse) on 04/09/20 at 1257  Med List Status: <None>  Medication Order Taking? Sig Documenting Provider Last Dose Status Informant  Accu-Chek Softclix Lancets lancets [id number] Yes Use as instructed to check blood sugar once daily. [name], MD Taking Active Self  albuterol (PROVENTIL) (2.5 MG/3ML) 0.083% nebulizer solution [id number] Yes Take 3 mLs (2.5 mg total) by nebulization every 6 (six) hours as needed for wheezing or shortness of breath. [name], MD Taking Active   albuterol (VENTOLIN HFA) 108 (90 Base) MCG/ACT inhaler [id number] Yes Inhale 2 puffs into the lungs every 4 (four) hours as needed for wheezing or shortness of breath. Ward, Delice Bison, DO Taking Active Self  azithromycin (ZITHROMAX) 250 MG tablet [id number] Yes TAKE 2 TABLETS BY MOUTH ON DAY 1, THEN  1 TABLET BY ON DAYS 2 THROUGH 5.  Patient taking differently: TAKE 2 TABLETS BY MOUTH ON DAY 1, THEN 1 TABLET BY ON DAYS 2 THROUGH 5.   Charlott Rakes, MD Taking Active   Blood Glucose Monitoring Suppl (ACCU-CHEK GUIDE ME) w/Device KIT [id number] No 1 each by Other route in the morning and at bedtime.  Patient not taking: Reported on 04/09/2020   [provider] Not Taking Active Self  Blood Glucose Monitoring Suppl (TRUE METRIX METER) w/Device KIT [id number] No Use to measure blood sugar twice a day  Patient not taking: Reported on 04/09/2020   [name], MD Not Taking Active Self  budesonide-formoterol [facility]) 160-4.5 MCG/ACT inhaler [id number] Yes INHALE 2 PUFFS INTO THE LUNGS 2 (TWO) TIMES DAILY. [name], MD Taking Active   cefUROXime (CEFTIN) 500 MG tablet [id number] No TAKE 1 TABLET (500 MG TOTAL) BY MOUTH 2 (TWO) TIMES DAILY FOR 5 DAYS.  Patient not taking: Reported on 04/09/2020   [name], MD Not Taking Active   ciprofloxacin (CIPRO) 500 MG tablet [id number] No TAKE 1 TABLET (500 MG TOTAL) BY MOUTH 2 (TWO) TIMES DAILY FOR 5 DAYS.  Patient not taking: Reported on 04/09/2020   [name], MD Not Taking Active   fluconazole (DIFLUCAN) 100 MG tablet [id number] No TAKE 1 TABLET (100 MG TOTAL) BY MOUTH DAILY FOR 7 DAYS.  Patient not taking: Reported on 04/09/2020   [name], MD Not Taking Active   folic acid (FOLVITE) 1 MG tablet [id number] Yes TAKE 1 TABLET (1 MG TOTAL) BY MOUTH DAILY. [name], MD Taking Active  furosemide (LASIX) 20 MG tablet [id number] Yes TAKE 1 TABLET (20 MG TOTAL) BY MOUTH DAILY AS NEEDED FOR EDEMA (LOWER EXTREMITY). [name], MD Taking Active   glucose blood (TRUE METRIX BLOOD GLUCOSE TEST) test strip [id number] No Use as instructed  Patient not taking: Reported on 04/09/2020   [name], MD Not Taking Active Self  guaiFENesin (MUCINEX) 600 MG 12 hr tablet [id number] Yes TAKE 1 TABLET (600 MG TOTAL) BY MOUTH 2  (TWO) TIMES DAILY. [name], MD Taking Active   ketoconazole (NIZORAL) 2 % cream [id number] No 1 (ONE) APPLICATION TO AFFECTED AREA TWICE A DAY  Patient not taking: Reported on 04/09/2020   [name], MD Not Taking Active   losartan (COZAAR) 100 MG tablet [id number] Yes TAKE 1 TABLET (100 MG TOTAL) BY MOUTH DAILY. [name], MD Taking Active   metFORMIN (GLUCOPHAGE) 1000 MG tablet [id number] Yes TAKE 1 TABLET (1,000 MG TOTAL) BY MOUTH 2 (TWO) TIMES DAILY WITH A MEAL. [name], MD Taking Active   metoprolol succinate (TOPROL-XL) 25 MG 24 hr tablet [id number] Yes TAKE 1 TABLET (25 MG TOTAL) BY MOUTH DAILY. [name], MD Taking Active   Multiple Vitamin (MULTIVITAMIN WITH MINERALS) TABS tablet [id number] No Take 1 tablet by mouth daily.  Patient not taking: Reported on 04/09/2020   [name], MD Not Taking Active   pantoprazole (PROTONIX) 40 MG tablet [id number] Yes Take 1 tablet (40 mg total) by mouth daily. [name], MD Taking Active Self  predniSONE (DELTASONE) 20 MG tablet [id number] Yes TAKE 2 TABLETS (40 MG TOTAL) BY MOUTH DAILY WITH BREAKFAST FOR 7 DAYS. [name], MD Taking Active            Med Note (Lu [place] A   Wed Apr 09, 2020 12:56 PM) Taking differently, taking one tablet daily  Respiratory Therapy Supplies (FLUTTER) DEVI [id number] Yes Use 4 times daily  Patient taking differently: 1 each by Other route See admin instructions. Use 4 times daily   [name], MD Taking Active Family Member  terbinafine (LAMISIL) 250 MG tablet [id number] No TAKE 1 (ONE) TABLET ONCE A DAY  Patient not taking: Reported on 04/09/2020   [name], MD Not Taking Active   thiamine 100 MG tablet [id number]  TAKE 1 TABLET (100 MG TOTAL) BY MOUTH DAILY. [name], MD  Active   Tiotropium Bromide Monohydrate 2.5 MCG/ACT AERS [id number] Yes INHALE 2 PUFFS INTO THE LUNGS DAILY. [name], MD Taking Active   traMADol Veatrice Bourbon) 50 MG tablet [id number] No  TAKE 1 TABLET (50 MG TOTAL) BY MOUTH EVERY 8 (EIGHT) HOURS AS NEEDED FOR UP TO 5 DAYS.  Patient not taking: Reported on 04/09/2020   [name], MD Not Taking Active   triamcinolone (KENALOG) 0.1 % [id number] No APPLY 1 APPLICATION TOPICALLY 2 (TWO) TIMES DAILY.  Patient not taking: Reported on 04/09/2020   [name], MD Not Taking Active           Patient Active Problem List   Diagnosis Date Noted  . COPD with acute exacerbation ([place]) 03/26/2020  . COPD exacerbation ([place]) 03/26/2020  . Elevated troponin 03/26/2020  . Pain of right forearm 12/03/2019  . Bronchiectasis ([place]) 09/24/2019  . Type 2 diabetes mellitus without complication, without long-term current use of insulin ([place]) 07/24/2019  . Other atopic dermatitis 05/01/2019  . Chronic respiratory failure with hypoxia ([place]) 04/23/2019  . Intellectual disability 04/23/2019  . History of alcohol use 11/23/2018  .  CAP (community acquired pneumonia) 05/17/2018  . Hypertension 05/17/2016  . History of tobacco abuse 04/06/2016  . COPD mixed type ([place]) 03/26/2016    Conditions to be addressed/monitored per PCP order:  COPD  Care Plan : COPD (Adult)  Updates made by James Montane, RN since 04/09/2020 12:00 AM    Problem: Symptom Exacerbation (COPD)     Long-Range Goal: Symptom Exacerbation Prevented or Minimized   Start Date: 04/09/2020  Expected End Date: 06/09/2020  This Visit's Progress: On track  Priority: High  Note:   Current Barriers:  [name] Knowledge deficits related to basic COPD self care/management-Mr. Jani was recently admitted for COPD exacerbation and pnuemonia. He reports taking his medications, wearing oxygen continuously, and has a follow up with his PCP on 04/14/20. He does report having difficulty at times swallowing food and liquid, pain in back and legs, and difficulty falling asleep. He is planning to discuss these issues with his PCP. Mr. Trompeter reports having transportation and plenty of food. He lives  alone, and has support from his sister.  . Does not contact provider office for questions/concerns  Case Manager Clinical Goal(s):  patient will report using inhalers as prescribed including rinsing mouth after use  patient will engage in lite exercise as tolerated to build/regain stamina and strength and reduce shortness of breath through activity tolerance  patient will verbalize basic understanding of COPD disease process and self care activities  patient will not be hospitalized for COPD exacerbation as evidenced  Interventions:  . Inter-disciplinary care team collaboration (see longitudinal plan of care)  Provided patient with basic written and verbal COPD education on self care/management/and exacerbation prevention   Provided instruction about proper use of medications used for management of COPD including inhalers  Provided patient with education about the role of exercise in the management of COPD  Advised patient to engage in light exercise as tolerated 3-5 days a week  Provided education about and advised patient to utilize infection prevention strategies to reduce risk of respiratory infection  Patient Goals/Self-Care Activities:  . - avoid second hand smoke . - identify and remove indoor air pollutants . - limit outdoor activity during cold weather . - listen for public air quality announcements every day  . - take prescribed medications as directed . - call PCP with new concerns or questions . - develop a new routine to improve sleep . - get at least 7 to 8 hours of sleep at night . - keep room cool and dark . - practice relaxation or meditation daily  Follow Up Plan: Telephone follow up appointment with care management team member scheduled for:04/16/20 @ 3:30pm      Follow Up:  Patient agrees to Care Plan and Follow-up.  Plan: The Managed Medicaid care management team will reach out to the patient again over the next 7 days.  Date/time of next scheduled RN  care management/care coordination outreach: 04/16/20 @ 3:30pm  [name] RN, [place] RN Care Coordinator

## 2020-04-11 ENCOUNTER — Other Ambulatory Visit: Payer: Self-pay

## 2020-04-11 NOTE — Patient Instructions (Signed)
Visit Information  James [name] was given information about Medicaid Managed Care team care coordination services as a part of their Healthy [facility] Medicaid benefit. James [name] verbally consented to engagement with the [facility] Managed Care team.   For questions related to your Healthy [facility] health plan, please call: [phone] or visit the homepage here: [email or web address]  If you would like to schedule transportation through your Healthy [facility] plan, please call the following number at least 2 days in advance of your appointment: [phone]   Call the [place] at [phone], at any time, 24 hours a day, 7 days a week. If you are in danger or need immediate medical attention call 911.  James [name] - following are the goals we discussed in your visit today:  Goals Addressed            This Visit's Progress   . Manage My Medicine       Timeframe:  Short-Term Goal Priority:  High Start Date:                             Expected End Date:                       Follow Up Date Monthly    - call for medicine refill 2 or 3 days before it runs out - call if I am sick and can't take my medicine - keep a list of all the medicines I take; vitamins and herbals too    Why is this important?   . These steps will help you keep on track with your medicines.   Notes:        Please see education materials related to DM provided as print materials.   Patient verbalizes understanding of instructions provided today.   The Managed Medicaid care management team will reach out to the patient again over the next 30 days.   [place] Constable, Pharm.D., Managed Medicaid Pharmacist [phone]   Following is a copy of your plan of care:  Patient Care Plan: COPD (Adult)    Problem Identified: Symptom Exacerbation (COPD)     Long-Range Goal: Symptom Exacerbation Prevented or Minimized   Start Date: 04/09/2020   Expected End Date: 06/09/2020  This Visit's Progress: On track  Priority: High  Note:   Current Barriers:  [name] Knowledge deficits related to basic COPD self care/management-James [name] was recently admitted for COPD exacerbation and pnuemonia. He reports taking his medications, wearing oxygen continuously, and has a follow up with his PCP on 04/14/20. He does report having difficulty at times swallowing food and liquid, pain in back and legs, and difficulty falling asleep. He is planning to discuss these issues with his PCP. James [name] reports having transportation and plenty of food. He lives alone, and has support from his sister.  . Does not contact provider office for questions/concerns  Case Manager Clinical Goal(s):  patient will report using inhalers as prescribed including rinsing mouth after use  patient will engage in lite exercise as tolerated to build/regain stamina and strength and reduce shortness of breath through activity tolerance  patient will verbalize basic understanding of COPD disease process and self care activities  patient will not be hospitalized for COPD exacerbation as evidenced  Interventions:  . Inter-disciplinary care team collaboration (see longitudinal plan of care)  Provided patient with basic written and verbal COPD education on self care/management/and exacerbation  prevention   Provided instruction about proper use of medications used for management of COPD including inhalers  Provided patient with education about the role of exercise in the management of COPD  Advised patient to engage in light exercise as tolerated 3-5 days a week  Provided education about and advised patient to utilize infection prevention strategies to reduce risk of respiratory infection  Patient Goals/Self-Care Activities:  . - avoid second hand smoke . - identify and remove indoor air pollutants . - limit outdoor activity during cold weather . - listen for public air quality  announcements every day  . - take prescribed medications as directed . - call PCP with new concerns or questions . - develop a new routine to improve sleep . - get at least 7 to 8 hours of sleep at night . - keep room cool and dark . - practice relaxation or meditation daily  Follow Up Plan: Telephone follow up appointment with care management team member scheduled for:04/16/20 @ 3:30pm    Patient Care Plan: Medication Management    Problem Identified: Health Promotion or Disease Self-Management (General Plan of Care)     Goal: Medication Management   Note:   Current Barriers:  . Unable to independently monitor therapeutic efficacy . Unable to self administer medications as prescribed . Does not adhere to prescribed medication regimen . Does not maintain contact with provider office . Does not contact provider office for questions/concerns .   Pharmacist Clinical Goal(s):  [name] Over the next 30 days, patient will contact provider office for questions/concerns as evidenced notation of same in electronic health record through collaboration with PharmD and provider.  .   Interventions: . Inter-disciplinary care team collaboration (see longitudinal plan of care) . Comprehensive medication review performed; medication list updated in electronic medical record  @RXCPDIABETES @ @RXCPHYPERTENSION @ @RXCPHYPERLIPIDEMIA @ Health Maintenance  Patient Goals/Self-Care Activities . Over the next 30 days, patient will:  - collaborate with provider on medication access solutions  Follow Up Plan: The care management team will reach out to the patient again over the next 30 days.     Task: Mutually Develop and Royce Macadamia Achievement of Patient Goals   Note:   Care Management Activities:    - verbalization of feelings encouraged    Notes:

## 2020-04-11 NOTE — Patient Outreach (Signed)
Medicaid Managed Care Pharmacy Note  04/11/2020 Name:  James [name] MRN:  [id number] DOB:  [name] 07, 1962  James [name] is an 60 y.o. year old male who is a primary patient of [name], MD.  The [facility] Managed Care Coordination team was consulted for assistance with disease management and care coordination needs.    Engaged with patient by telephone for follow up visit in response to referral for case management and/or care coordination services.  [name] was given information about Medicaid Managed Care Coordination team services today. [name] agreed to services and verbal consent obtained.  Objective:  Lab Results  Component Value Date   CREATININE 0.80 03/28/2020   CREATININE 0.80 03/27/2020   CREATININE 0.80 03/26/2020    Lab Results  Component Value Date   HGBA1C 6.7 (H) 03/26/2020       Component Value Date/Time   TRIG 95 10/21/2019 0256    Other: (TSH, CBC, Vit D, etc.)  Clinical ASCVD: No  The ASCVD Risk score James [name] DC Jr., et al., 2013) failed to calculate for the following reasons:   Cannot find a previous HDL lab   Cannot find a previous total cholesterol lab    Other: (CHADS2VASc if Afib, PHQ9 if depression, MMRC or CAT for COPD, ACT)  BP Readings from Last 3 Encounters:  03/28/20 113/81  03/04/20 130/84  02/13/20 136/78    Assessment/Interventions: Review of patient past medical history, allergies, medications, health status, including review of consultants reports, laboratory and other test data, was performed as part of comprehensive evaluation and provision of chronic care management services.   Meds: -Spoke with sister, she is at [facility] and doesn't have brother's meds with her. Very interested in having medications delivered because brother won't get medications unless someone reminds him Plan: Will onboard next week when not on vacation. Verbal consent obtained for [facility] enhanced pharmacy services (medication  synchronization, adherence packaging, delivery coordination). A medication sync plan was created to allow patient to get all medications delivered once every 30 to 90 days per patient preference. Patient understands they have freedom to choose pharmacy and clinical pharmacist will coordinate care between all prescribers and [facility].    SDOH:  (Social Determinants of Health) assessments and interventions performed:    Care Plan  Allergies  Allergen Reactions  . Gabapentin Other (See Comments)    hallucinations    Medications Reviewed Today    Reviewed by [name], RN (Registered Nurse) on 04/09/20 at 1257  Med List Status: <None>  Medication Order Taking? Sig Documenting Provider Last Dose Status Informant  Accu-Chek Softclix Lancets lancets [id number] Yes Use as instructed to check blood sugar once daily. James Stain, MD Taking Active Self  albuterol (PROVENTIL) (2.5 MG/3ML) 0.083% nebulizer solution [id number] Yes Take 3 mLs (2.5 mg total) by nebulization every 6 (six) hours as needed for wheezing or shortness of breath. James August, MD Taking Active   albuterol (VENTOLIN HFA) 108 (90 Base) MCG/ACT inhaler [id number] Yes Inhale 2 puffs into the lungs every 4 (four) hours as needed for wheezing or shortness of breath. Ward, Delice Bison, DO Taking Active Self  azithromycin (ZITHROMAX) 250 MG tablet [id number] Yes TAKE 2 TABLETS BY MOUTH ON DAY 1, THEN 1 TABLET BY ON DAYS 2 THROUGH 5.  Patient taking differently: TAKE 2 TABLETS BY MOUTH ON DAY 1, THEN 1 TABLET BY ON DAYS 2 THROUGH 5.   Charlott Rakes, MD Taking Active   Blood Glucose Monitoring  Suppl (ACCU-CHEK GUIDE ME) w/Device KIT [id number] No 1 each by Other route in the morning and at bedtime.  Patient not taking: Reported on 04/09/2020   [provider] Not Taking Active Self  Blood Glucose Monitoring Suppl (TRUE METRIX METER) w/Device KIT [id number] No Use to measure blood sugar twice a day  Patient not  taking: Reported on 04/09/2020   James Stain, MD Not Taking Active Self  budesonide-formoterol [facility]) 160-4.5 MCG/ACT inhaler [id number] Yes INHALE 2 PUFFS INTO THE LUNGS 2 (TWO) TIMES DAILY. James Stain, MD Taking Active   cefUROXime (CEFTIN) 500 MG tablet [id number] No TAKE 1 TABLET (500 MG TOTAL) BY MOUTH 2 (TWO) TIMES DAILY FOR 5 DAYS.  Patient not taking: Reported on 04/09/2020   James August, MD Not Taking Active   ciprofloxacin (CIPRO) 500 MG tablet [id number] No TAKE 1 TABLET (500 MG TOTAL) BY MOUTH 2 (TWO) TIMES DAILY FOR 5 DAYS.  Patient not taking: Reported on 04/09/2020   James Stain, MD Not Taking Active   fluconazole (DIFLUCAN) 100 MG tablet [id number] No TAKE 1 TABLET (100 MG TOTAL) BY MOUTH DAILY FOR 7 DAYS.  Patient not taking: Reported on 04/09/2020   James Stain, MD Not Taking Active   folic acid (FOLVITE) 1 MG tablet [id number] Yes TAKE 1 TABLET (1 MG TOTAL) BY MOUTH DAILY. James August, MD Taking Active   furosemide (LASIX) 20 MG tablet [id number] Yes TAKE 1 TABLET (20 MG TOTAL) BY MOUTH DAILY AS NEEDED FOR EDEMA (LOWER EXTREMITY). James August, MD Taking Active   glucose blood (TRUE METRIX BLOOD GLUCOSE TEST) test strip [id number] No Use as instructed  Patient not taking: Reported on 04/09/2020   James Stain, MD Not Taking Active Self  guaiFENesin (MUCINEX) 600 MG 12 hr tablet [id number] Yes TAKE 1 TABLET (600 MG TOTAL) BY MOUTH 2 (TWO) TIMES DAILY. James August, MD Taking Active   ketoconazole (NIZORAL) 2 % cream [id number] No 1 (ONE) APPLICATION TO AFFECTED AREA TWICE A DAY  Patient not taking: Reported on 04/09/2020   [name], MD Not Taking Active   losartan (COZAAR) 100 MG tablet [id number] Yes TAKE 1 TABLET (100 MG TOTAL) BY MOUTH DAILY. James Stain, MD Taking Active   metFORMIN (GLUCOPHAGE) 1000 MG tablet [id number] Yes TAKE 1 TABLET (1,000 MG TOTAL) BY MOUTH 2 (TWO) TIMES DAILY WITH A MEAL. James August, MD Taking Active    metoprolol succinate (TOPROL-XL) 25 MG 24 hr tablet [id number] Yes TAKE 1 TABLET (25 MG TOTAL) BY MOUTH DAILY. James Stain, MD Taking Active   Multiple Vitamin (MULTIVITAMIN WITH MINERALS) TABS tablet [id number] No Take 1 tablet by mouth daily.  Patient not taking: Reported on 04/09/2020   James August, MD Not Taking Active   pantoprazole (PROTONIX) 40 MG tablet [id number] Yes Take 1 tablet (40 mg total) by mouth daily. James Stain, MD Taking Active Self  predniSONE (DELTASONE) 20 MG tablet [id number] Yes TAKE 2 TABLETS (40 MG TOTAL) BY MOUTH DAILY WITH BREAKFAST FOR 7 DAYS. James August, MD Taking Active            Med Note (ROBB, MELANIE A   Wed Apr 09, 2020 12:56 PM) Taking differently, taking one tablet daily  Respiratory Therapy Supplies (FLUTTER) DEVI [id number] Yes Use 4 times daily  Patient taking differently: 1 each by Other route See admin instructions. Use 4 times daily   James Stain, MD Taking Active Family Member  terbinafine (LAMISIL) 250 MG tablet [id number]  No TAKE 1 (ONE) TABLET ONCE A DAY  Patient not taking: Reported on 04/09/2020   [name], MD Not Taking Active   thiamine 100 MG tablet [id number]  TAKE 1 TABLET (100 MG TOTAL) BY MOUTH DAILY. James August, MD  Active   Tiotropium Bromide Monohydrate 2.5 MCG/ACT AERS [id number] Yes INHALE 2 PUFFS INTO THE LUNGS DAILY. James Stain, MD Taking Active   traMADol Veatrice Bourbon) 50 MG tablet [id number] No TAKE 1 TABLET (50 MG TOTAL) BY MOUTH EVERY 8 (EIGHT) HOURS AS NEEDED FOR UP TO 5 DAYS.  Patient not taking: Reported on 04/09/2020   James Stain, MD Not Taking Active   triamcinolone (KENALOG) 0.1 % [id number] No APPLY 1 APPLICATION TOPICALLY 2 (TWO) TIMES DAILY.  Patient not taking: Reported on 04/09/2020   James Stain, MD Not Taking Active           Patient Active Problem List   Diagnosis Date Noted  . COPD with acute exacerbation ([place]) 03/26/2020  . COPD exacerbation ([place]) 03/26/2020  .  Elevated troponin 03/26/2020  . Pain of right forearm 12/03/2019  . Bronchiectasis ([place]) 09/24/2019  . Type 2 diabetes mellitus without complication, without long-term current use of insulin ([place]) 07/24/2019  . Other atopic dermatitis 05/01/2019  . Chronic respiratory failure with hypoxia ([place]) 04/23/2019  . Intellectual disability 04/23/2019  . History of alcohol use 11/23/2018  . CAP (community acquired pneumonia) 05/17/2018  . Hypertension 05/17/2016  . History of tobacco abuse 04/06/2016  . COPD mixed type ([place]) 03/26/2016    Conditions to be addressed/monitored per PCP order:  HTN and DMII  Care Plan : Medication Management  Updates made by [name], [place] since 04/11/2020 12:00 AM    Problem: Health Promotion or Disease Self-Management (General Plan of Care)     Goal: Medication Management   Note:   Current Barriers:  . Unable to independently monitor therapeutic efficacy . Unable to self administer medications as prescribed . Does not adhere to prescribed medication regimen . Does not maintain contact with provider office . Does not contact provider office for questions/concerns .   Pharmacist Clinical Goal(s):  [name] Over the next 30 days, patient will contact provider office for questions/concerns as evidenced notation of same in electronic health record through collaboration with PharmD and provider.  .   Interventions: . Inter-disciplinary care team collaboration (see longitudinal plan of care) . Comprehensive medication review performed; medication list updated in electronic medical record  @RXCPDIABETES @ @RXCPHYPERTENSION @ @RXCPHYPERLIPIDEMIA @ Health Maintenance  Patient Goals/Self-Care Activities . Over the next 30 days, patient will:  - collaborate with provider on medication access solutions  Follow Up Plan: The care management team will reach out to the patient again over the next 30 days.     Task: Mutually Develop and Royce Macadamia Achievement of  Patient Goals   Note:   Care Management Activities:    - verbalization of feelings encouraged    Notes:      Medication Assistance: Would like meds delivered due to transportation issues  Follow Up:  Patient agrees to Care Plan and Follow-up.  Plan:  The Managed Medicaid care management team will reach out to the patient again over the next 30 days.  [place] Constable, Pharm.D., Managed Medicaid Pharmacist - [phone]

## 2020-04-13 NOTE — Progress Notes (Signed)
Subjective:    Patient ID: James [name], male    DOB: [birth year], 60 y.o.   MRN: [id number]  History of Present Illness: 08/23/18 60 y.o.M with advanced Copd   I saw this patient a week ago and prescribed antibiotics and prednisone for what I perceived to be persisting pneumonia.  The patient had a slight improvement but then worsened and came to the emergency room the end of the week and was admitted for 3 days for COPD exacerbation and lower lobe pneumonia.  Chest x-ray did show persistent infiltrates in the lower lobes.  The patient is now discharged and returns to the office for post hospital follow-up.  Below is the discharge summary Dc summary : Admit date: 07/31/2018 Discharge date: 08/01/2018  Time spent: 45 minutes  Recommendations for Outpatient Follow-up:  Patient will be discharged to home.  Patient will need to follow up with primary care provider within one week of discharge.  Patient should continue medications as prescribed.  Patient should follow a heart healthy diet.   Discharge Diagnoses:  Acute on chronic respiratory failure with hypoxia secondary to COPD Exacerbation and pneumonia Essential hypertension  Discharge Condition: Stable  Diet recommendation: heart healthy  Filed Weights  07/31/18 0231 07/31/18 0602 Weight: 85.3 kg 83.4 kg   History of present illness:  on 07/31/2018 by Dr. Candie Mile [name] a 60 y.o.malewith medical history significant ofCOPD. Patient has had 3 prior admits for COPD / PNA thus far this year, most recently in May.  Patient has been being treated for COPD exacerbation and BLL CAP as outpatient by Dr. Joya [name] with levaquin and prednisone. Just finished levaquin yesterday. Prednisone remained at 98m daily with plans to initiate a slow taper.  Despite this, patient developed severe SOB and respiratory distress this evening. EMS called and patient noted to be satting 80% on RA. He is not on chronic O2  for his COPD.  Hospital Course:  Acute on chronic respiratory failure with hypoxia secondary to COPD Exacerbation and pneumonia -Patient noted to have oxygen saturations in the 80s on room air -Chest x-ray noted for pneumonia -Was recently placed on Levaquin and steroids by PCP approximately 1 week ago -On admission, patient continued to have wheezing well into his hospital stay day 1.  Patient currently still has some wheezing however has improved. -Was placed on Solu-Medrol, along with nebulizer treatments, azithromycin and ceftriaxone -Patient did require oxygen upon admission however has been able to ambulate and maintain oxygen saturations in the high 90s on room air. -Will discharge patient with prednisone taper, azithromycin and Ceftin. -Given that patient had oxygen saturations in the 80s on admission, it was thought that he would require several days in the hospital. Patient actually improved quicker than expected.  Essential hypertension -Continue losartan  Since discharge the patient is tried to go back to work but has extreme difficulty in the heat with this.  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  10/05/2018 This patient's not been seen since the end of July he had failed several follow-up visits.  He did see Dr. WShyrl Numbersend of August who did not make  major changes in his medication program.  He is to remain on Dulera and Incruse.  The patient after being admitted in July got readmitted early August and then again in early September.  Discharge summaries are as noted below.  During the August admission the patient underwent bronchoscopy and had mucous plugging  removed the culture showed normal flora Note this patient has not been actively smoking recently Admit date: 08/09/2018 Discharge date: 08/11/2018  Discharge Diagnoses:  Acute on chronic hypoxic respiratory failure/sepsis due to multifocal pneumonia COPD Alcohol/marijuana use Essential hypertension  History of present illness:  on 08/09/2018 by Dr. Jennifer Yates [name] D James [name] a 60 y.o.malewith medical history significant ofCOPD and ETOH dependence presenting with respiratory distress.He woke up about 530 this AM and couldn't breathe. This just keeps happening. He felt similarly when hospitalized last week. He has been doing fine until today. No h/o OSA, not on CPAP. Not on home O2. +cough, productive of yellowish sputum. It is sometimes hard to get the sputum up. No fever. +wheezing since his last hospitalization. Quit smoking in 2014. He lives in a non-air-conditioned log cabin.  He was hospitalized from 7/27-28 for acute on chronic respiratory failure associated with COPD exacerbation and PNA. This was his 4th admission this year. He was seen by Dr.  on 7/30 with plan for completion of course of Keflex and prednisone, as well as prednisone taper.  Hospital Course:  Acute on chronic hypoxic respiratory failure/sepsis due to multifocal pneumonia -Was recently hospitalized in late July for pneumonia and COPD exacerbation. Was discharged with antibiotics as well as prednisone taper. Patient followed up with Dr.  on 08/03/2018 with completion of Keflex and prednisone as well. -This is his fourth admission this year for similar presentation -Patient presented with cough and shortness of breath -Chest x-rayon admissionfindings consistent with right-sided multifocal pneumonia -CTA chest showed multifocal pneumonia on the right. COPD including emphysema. Negative for PE. -COVID negative -Upon admission, patient had leukocytosis, fever, tachycardia, tachypnea-  all resolving  -Noted to be hypoxic 88% on room air in the ED -Urine strep pneumoniaand legionellaantigensnegative -Sputum culture: Few GPC, GVR, rare yeast -Blood cultures show no growth -was placed on vancomycin, Zosyn, Solu-Medrol, supplemental oxygen -S/p bronchoscopy 8/6, pending culture results -CXR obtained after bronch on 8/6: almost complete clearing of the hazy infiltrates in the right lung -Discussed with Dr. Smith (pulm), recommended prednisone taper starting with prednisone 40mg daily x 1 week, taper as an outpatient, along with Augmentin. He will follow up with the patient in one week. Given that infection cleared on xray so quickly, question if this is inflammatory vs infection.  -patient able to ambulate today without oxygen and maintained oxygen saturations in the mid 90s- therefore patient does not qualify for home oxygen -patient also had a swallow eval- mild aspiration risk  COPD -Continue nebulizer treatments, steroids, inhalers -Incentive spirometry, flutter valve  Alcohol/marijuana use -Discussed cessation -was placed on continue CIWA protocol  Essential hypertension -Continue Cozaar  Consultants PCCM- pulmonology   Procedures Bronchoscopy   Recommendations for Outpatient Follow-up:  1. Follow up with PCP in 1-2 weeks 2. Please obtain BMP/CBC in one week your next doctors visit.  3. Augmentin and azithromycin for 5 days 4. Advised to continue using bronchodilators every 4-6 hours for the next 4-5 days and then transition to as needed. 5. Advised to take his home prednisone, for next 3 days advised to increase it to 40 mg twice daily then resume back is 20 mg daily    Discharge Condition: Stable CODE STATUS: Full code Diet recommendation: 2 g salt   DC summary from 09/11/18  Adm 9/7  D/C 09/13/18 Brief/Interim Summary: Rolin D Jeffreysis a 60 y.o.malewith history ofCOPD on prednisone, HTN, alcohol use disorder, marijuana use,tobacco user.  Patient presented secondary to dyspnea and found to have evidence of pneumonia and a COPD exacerbation in addition to sepsis physiology on admission.  There is concerns of possible right lower lobe pneumonia therefore started on Unasyn and azithromycin due to concerns of aspiration.  This was transitioned to Augmentin and azithromycin for total of 5 days at time of discharge.  Secondary to infection, he was having COPD exacerbation therefore on bronchodilators.  He started improving and on the day of discharge he was adamant about going home as he was feeling much better.  He was still having abnormal breath sounds and cough therefore I had requested him to stay another day in the hospital but still wanted to go home.  Instructions were given to him take 40 mg of prednisone for next 3 days and then he can resume back his 20 mg daily.  Advised... home bronchodilators periodically every 4-6 hours for next 4-5 days and then transition to as needed. Advised to follow-up with his PCP in about 1 week.   Discharge Diagnoses:  Active Problems:   Hypertension   COPD exacerbation (HCC)   Marijuana abuse   Pneumonia   Sepsis (HCC)  Acute respiratory distress secondary to right lower lobe pneumonia, community-acquired/aspiration Acute mild exacerbation of COPD Concern for possible aspiration.  Unasyn and azithromycin transition to Augmentin and azithromycin.  We will give him 5 more day course at the time of discharge 40 mg of prednisone for next 3 days then transition to 20 mg daily which is his home dose Advised to use bronchodilators.3  Alcohol use disorder Counseled to quit drinking alcohol.  Advised to continue thiamine, folate and multivitamin  Weakness -He did well with physical therapy.  Tobacco use Marijuana use Cessation discussed on admission  Consultations:  None  Subjective: Still having some cough and congestion but he is adamant about going home today as he is feeling much  better than from admission.  I recommended he stay in the hospital for at least 1 more day to help him feel even better.  He wished to go home. Ambulating in the hallway without additional saturating greater than 90% on room air.   Since discharge this patient went to urgent care on 21 September with an outbreak of shingles over his left lower back.  He is taken a course of valacyclovir and prednisone.  He has severe pain.  He was given gabapentin but this caused hallucinations and then we tried tramadol but this caused severe nausea and vomiting.  Apparently the patient cannot take Tylenol or nonsteroidal anti-inflammatories as well.  The patient states his breathing is been labored.  He still has productive cough of white mucus.  He has no fever.  He has significant back pain because of the shingles. The patient is maintaining the Dulera and Incruse as well as his antihypertensive medications and vitamins     10/12/2018  This patient is seen in return follow-up for acute shingles with postherpetic neuropathy, recent pneumonia, COPD exacerbation, and hypertension.  Patient is markedly improved from the last visit.  He has less cough less shortness of breath.  He has improved pain around the area of the shingles.  He is taking amitriptyline 25 mg at bedtime  The   patient notes less pain.  He does state he had been using the tramadol without nausea or vomiting.  I asked if patient would be interested in the para medicine program and expressed interest in the community para medicine program  11/23/2018 Patient seen in return follow-up and has noted increasing shortness of breath and we had to resume prednisone this patient.  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  He does maintain his inhalers.  He is drinking 4 beers a night.  He is not smoking.  Note the patient continues to live in a home that has standing water under the foundation and in several the rooms and mold on the carpet.  We have  connected this patient with legal aid to see if they can assist him in getting into better housing.  The patient's not had previous allergy testing   12/13/2018 The patient notes increased shortness of breath, wheezing and cough productive of thick yellow mucus.  He is using the flutter valve but is not being is productive with this.  He denies chest pain or fever.  He is down to 2 a day on the prednisone.  He is maintaining his inhalers. Apparently there is repair is being done on the patient's home and he is decided to stay in the home  02/27/2019 This patient is seen in return follow-up for COPD.  The patient has significant mold exposure in the home.  There has been some mitigation of this but is not complete.  The patient just was in the urgent care for COPD exacerbation a week ago treated with a course of antibiotics and prednisone.  He is now off prednisone. Patient states the pain in the back has resolved however he is still having increased shortness of breath.  He still having productive cough of thick yellow mucus.  05/01/2019  Since the last OV the patient has been admitted for Copd flare again. Below is the discharge summary Dc summary:  Admit Date: 04/23/2019 Discharge date: 04/27/2019  Recommendations for Outpatient Follow-up:  1. Follow up with PCP in 1-2 weeks 2. Please obtain CMP/CBC in one week 3. Please repeat chest x-ray in the next 6 to 8 weeks to document resolution of infiltrates, if not may require further work-up.     Brief Narrative: Patient is a50 y.o.malewith history of COPD, intellectual disability, HTN who presented with shortness of breath-found to have acute hypoxic respiratory failure secondary to pneumonia and COPD exacerbation. Patient initially required BiPAP on admission. See below for further details.  Significant events: 4/19>> admit to [facility] for severe hypoxemia requiring BiPAP 4/20>> liberated from BiPAP  Antimicrobial  therapy: Rocephin 4/18>>4/23 Zithromax 4/18>>4/22  Microbiology data: Blood culture: 4/19>> negative  Procedures : None  Consults: None  Brief Hospital Course: Acute hypoxic/hypercarbic respiratory failure secondary to COPD exacerbation and pneumonia:Improved-initially required BiPAP on admission-titrated off BiPAP on 4/20.Now on room air with stable O2 saturations.  Feels much better-treated with Rocephin and Zithromax-we will transition to [facility] for a few more days on discharge.  He will resume his usual inhaler regimen-he will be placed on 20 mg of p.o. prednisone until he is seen by Dr. Joya [name].  PCP to repeat a chest x-ray in 6 to 8 weeks to document resolution of infiltrates-if not he may require further work-up to rule out underlying malignancy.  (Per patient's sister-patient probably has been taking more prednisone than he should be-he is unable to read-and is not able to follow directions)  HTN: BPstable-continue losartan  EtOH abuse:No signs  of withdrawal-managed with  Ativan per protocol. Per patient's sister-he drinks around 2-3 bottles of beer on a daily basis.  Intellectual disability:Spoke at length with patient's sister ([name]) today-patient isa illiterate-and has been living on his own for all his life. His other sister lives a [place] and checks on him daily. Sister acknowledges poor living conditions-but apparently this is the way that patient has lived all his life-and chooses to live this way. 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 Patient is currently managing all his finances. [name] does acknowledge that patient will not let any home health services come to his house-he will not go to SNF. Difficult situation-but even though patient is an illiterate/intellectual disability-he understand that he is somewhat deconditioned from his acute illness-and is in need of some help-he does not want therapy services-he understands the risk of falls from  being weak.  This MD had numerous discussions with him regarding his appropriate disposition-all he wants to do is go home-his sister-[name] wants to have him come to her place in [place], [place] for a few weeks-however the patient really does not want to go there-and wants to go home.  I have discussed at length with social worker as well-at this point he is being discharged home at his own request.  His family will keep an eye on him.   Nutrition Problem: Nutrition Problem: Increased nutrient needs Etiology: chronic illness(COPD) Signs/Symptoms: estimated needs Interventions: Ensure Enlive (each supplement provides 350kcal and 20 grams of protein)  Discharge Diagnoses:  Principal Problem:   Acute on chronic respiratory failure with hypoxia and hypercapnia (HCC) Active Problems:   Hypertension   COPD exacerbation ([place])   Community acquired pneumonia of right lower lobe of lung   Marijuana abuse   Intellectual disability  Since discharge the patient has completed his antibiotics for his right lower lobe pneumonia.  He is on tapering prednisone.  He has developed a rash on his arms and face.  The rash is pruritic in nature.  He has been maintaining his medications as prescribed.   07/23/2019 Since the last visit this patient was readmitted to the hospital between 30 June and 3 July.  Below is a copy of the discharge summary Discharge Diagnosis: 1. Acute hypoxic respiratory 2/2 COPD exacerbation vs pneumonitis 2. GERD 3. EtOH history 4. HTN  Discharge Medications: Allergies as of 07/07/2019     Reactions  Gabapentin Other (See Comments)  hallucinations  Disposition and follow-up:   Mr.Hisham D Geerts was discharged from [facility] in Stable condition.  At the hospital follow up visit please address:  1.  COPD exacerbation: Continue daily Albuterol, Dulera, Spiriva, and prednisone 40 mg daily. Sent home with 1 additional day of azithromycin.  Discharged on 2L [place]. F/u allergy labs.  GERD: Severe reflux noted on esophagram, started on pantoprazole daily, may need GI follow up.   2.  Labs / imaging needed at time of follow-up: None  3.  Pending labs/ test needing follow-up: IgE, allergen panel for mold, hypersensitivity pneumonitis  Note since discharge the patient's breathing is back to baseline.  The patient does have some wheezing and minimal productive cough.  Noticed blood pressures ranged up at this visit.  He is maintaining Dulera Spiriva as before he has been maintained on prednisone 40 mg daily and also discharged on 2 L oxygen.  He no longer is actively working at this time.  Note his mold assay did show a mild hypersensitivity to some yeast.  08/23/2019  Patient returns today in follow-up showing increased shortness of breath.  He continues to work outdoors in the heat and this is exacerbating his condition despite being on 20 mg of prednisone daily.  He notes thick yellow mucus that he is coughing up.  He notes increased wheeze.  He has ran out of his Spiriva        10/02/2019 This patient returns after having been in the hospital again for 4 days with discharge summary as noted below. Patient returns today with continued shortness of breath and productive cough. Mucus is still thick and yellow and difficult to raise. He has chest tightness. He still drinking 6-7 beers daily. He is no longer smoking. He is now on oxygen 4 L continuous. He has had his Medicaid approved and is now fully declared disabled. 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 The landlord has a complex relationship with this patient. Patient lives rent free and a home on the landlord's property. The home is not in good repair as documented previously.   Date of Admission: 09/22/2019  3:42 AM Date of Discharge: 09/25/2019 Attending Physician: Lenice Pressman, MD PhD  Discharge Diagnosis: 1. COPD exacerbation, underlying  bronchiectasis 2. Alcohol use disorder 3. Skin rash    Disposition and follow-up:   Mr.Jayvion D Eltringham was discharged from [facility] in Stable condition.  At the hospital follow up visit please address:  1.  Follow up      A. COPD exacerbation, recurrent pneumonias, underlying bronchiectasis - hx of recurrent pneumonia, see hospital course for details on prior workup, follow up with his pulmonologist      B. Alcohol use disorder - had mild withdrawal symptoms, motivated to quit, consider pharmacologic assistance such as naltrexone      C. Skin rash - not improving with triamcinolone, referred to dermatology  2.  Labs / imaging needed at time of follow-up: skin biopsy  3.  Pending labs/ test needing follow-up: none  Follow-up Appointments:  Hospital Course by problem list:  COPDexacerbation Recurrent pneumonia Bronchiectasis on bronchoscopy Presents with increased SOB, but cough baseline and not productive. He has had similar symptoms many times previously. PFTs in April 2021 with FEV1/FVC which was 54.01% of expected. CXR this admission with vague R lower lung field opacity, same location as opacity on prior CXRs. Has had recurrent pneumonia as far back as 2015. bronchoscopy 08/10/2018 for recurrent PNU demonstrating severe chronic bronchitis and bronchiectasis,neutrophil predominance and transbronchial biopsies with benign lung tissue with inflammation. CT on 07/05/19 with no ILD, no centrally obstructing lesion. Esophagram on 07/06/19 with no stenosis but severe reflux which could be contributing to COPD. Other pulm workup significant for IgE WNL, allergen profile for mold equivocal for Candida albicans but negative for all others, negative hypersensitivity pneumonitis panel. Has very good outpatient follow up with pulmonology. Is on Dulera, Spiriva, and albuterol and also prednisone 47m daily, states he has been compliant with all these, though at one point earlier  this admission denied steroid use. Treated with ceftriaxone, azithromycin, and prednisone initially with rapid improvement in SOB. No fevers this admission, cough remains unproductive so did not culture. Discharged on his home Dulera, Spiriva, albuterol, Prednisone. Discharged with oral azithromycin to complete 5 day course.   Alcohol Use Disorder Reports using 2x 24oz beers per day, which is decreased from prior. No hx of withdrawal seizures. CIWA scores max of 15 indicating mild withdrawal, requiring relatively low  amounts of Ativan. On day of discharge had scores of up to 5. Patient is motivated to quit drinking because "I want to live longer." Discharged with PO thiamine.  Skin rash Reports a rash for the past several months on his abdomen and both arms. His PCP has been treating this with triamcinolone, but this has not really helped. Referral provided to dermatology for biopsy/further assessment.  Note the patient's rash on both forearms is still persistent and triamcinolone has not helped  This patient will need a dermatology referral as per discharge summary. Note the patient is still actively drinking alcohol. See shortness of breath assessment below  11/06/2019 This patient is seen by way of a telephone visit and post hospital.  He was just admitted again for recurrent pneumonia this time with Covid infection.  This patient was intubated was severely ill.  He is now on oxygen 4 L continuous and 40 mg daily of prednisone.  He also has a allergic dermatitis rash.  This has not been responsive to steroids and is quite pruritic despite hydroxyzine when seen recently at urgent care.  I went over with this patient at this visit the need to receive assisted care but he refuses and wishes to stay in his current living arrangement which has very poor environment  Below is a copy of the recent discharge summary  Admission date:  10/19/2019  Admitting Physician  No admitting provider for  patient encounter.  Discharge Date:  10/26/2019   Primary MD  ,  E, MD  Recommendations for primary care physician for things to follow:  -Patient instructed to follow with PCP/pulmonary as an outpatient, appointment will be rescheduled till he is off quarantine as discussed with the patient and staff. -Please check CBC, CMP during next visit.  Admission Diagnosis  COPD exacerbation (HCC) (J44.1) Acute on chronic respiratory failure with hypoxia (HCC) (J96.21) Pneumonia due to COVID-19 virus (U07.1, J12.82)   Discharge Diagnosis  COPD exacerbation (HCC) (J44.1) Acute on chronic respiratory failure with hypoxia (HCC) (J96.21) Pneumonia due to COVID-19 virus (U07.1, J12.82)    Active Problems:   Acute on chronic respiratory failure with hypoxia (HCC)   Pneumonia due to COVID-19 virus      Past Medical History: Diagnosis Date . Acute on chronic respiratory failure with hypoxia (HCC) 06/08/2013 . CAP (community acquired pneumonia) 05/17/2018  See admit 05/17/18 ? rml  with   covid pcr neg - rx augmentin > f/u cxr in 4-6 weeks is fine unless condition declines  . Community acquired pneumonia of right lower lobe of lung 05/17/2018  See admit 05/17/18 ? rml  with   covid pcr neg - rx augmentin > f/u cxr in 4-6 weeks is fine unless condition declines  . COPD (chronic obstructive pulmonary disease) (HCC)   not on home . Diabetes (HCC)  . Dyspnea  . Hypertension  . Pneumonia 04/06/2016 . Pneumothorax   2016, fell from ladder . 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 Tick bite 06/03/2018   Past Surgical History: Procedure Laterality Date . VIDEO ASSISTED THORACOSCOPY (VATS)/THOROCOTOMY Left 06/11/2013  Procedure: VIDEO ASSISTED THORACOSCOPY (VATS)/THOROCOTOMY;  Surgeon: Peter Van Trigt, MD;  Location: [facility] OR;  Service: Thoracic;  Laterality: Left; . VIDEO BRONCHOSCOPY N/A 06/11/2013  Procedure: VIDEO BRONCHOSCOPY;  Surgeon: Peter Van Trigt, MD;  Location: [facility]  OR;  Service: Thoracic;  Laterality: N/A; . VIDEO BRONCHOSCOPY N/A 08/10/2018  Procedure: VIDEO BRONCHOSCOPY WITH FLUORO;  Surgeon: Smith, Daniel C, MD;  Location: [facility] ENDOSCOPY;  Service: Endoscopy;  Laterality: N/A;        History of present illness and  Hospital Course:     Kindly see H&P for history of present illness and admission details, please review complete Labs, Consult reports and Test reports for all details in brief  HPI  from the history and physical done on the day of admission by PCCM on 10/19/2019  Pt is encephelopathic; therefore, this HPI is obtained from chart review. Tyland D Dunavantis a 59 y.o.malewho has a PMH as outlined below.[facility]resented to [facility] 10/15 with significant dyspnea to the point that he could not speak in full sentences. He was placed on CPAP but did not respond; therefore, he was intubated.  After intubation, COVID returned positive.  He has been evaluated by pulmonary on prior admissions given recurrent PNA. He has bronchoscopy in Aug 2020 which showed severe bronchiectasis bilaterally, neutrophil predominance and transbronchial biopsies were benign.   Hospital Course   Trevionne D Wainis a 59 y.o.malewho has a PMH of chronic respiratory failure, COPD, diabetes, hypertension, presented to [facility] ED 10/15 with significant dyspnea to the point that he could not speak in full sentences. He was placed on CPAP but did not respond; therefore, he was intubated.After intubation, COVID returned positive. He has been evaluated by pulmonary on prior admissions given recurrent PNA. He has bronchoscopy in Aug 2020 which showed severe bronchiectasis bilaterally, neutrophil predominance and transbronchial biopsies were benign. Patient was successfully extubated 10/17, and he was transferred to [facility] on 10/19.   Acute on chronic hypoxic Resp. Failure due to Acute Covid 19 Viral Pneumonitis during the ongoing 2020 Covid 19 Pandemic   -Unvaccinated, he is a critically ill on presentation required intubation.  He was successfully extubated after 48 hours, with increased oxygen requirement initially, he is currently back at his baseline oxygen requirement, report requiring 2 L at rest, 4 L with activity. -He is extubated 10/17. -Treated initially with IV steroids, he will be discharged on home dose prednisone 20 mg oral daily (this was confirmed with with the patient that he is at current dose, he was given a refill for next 30 days till he is seen by PCP) -He was treated with remdesivir during hospital stay, as well he was treated with baricitinib as well -Was encouraged to take his incentive spirometry and flutter valve at home and continue using them .   COPD exacerbation -Treated with antibiotics for 5 days, continue with bronchodilators, continue with steroid  .  Hypertension -Continue with home dose metoprolol, and Cozaar  Diabetes mellitus -We will hold home dose Metformin, will keep on sliding scale during hospital stay, A1c is 6.8  History of alcohol abuse -No evidence of withdrawal during hospital stay, continue with thiamine and folic acid on discharge  Discharge Condition:  stable   Patient states his breathing is back at baseline he states he is no longer drinking alcohol he states his blood sugars have been in the 100 range he complains of his left hand feeling numb and is not able to sleep at night due to agitation   11/13/19 This patient was seen in return follow-up after had been seen by way of a telephone visit a week ago.  He was just hospitalized note discharge summary is as above.  He had a Covid pneumonia and respiratory failure was on mechanical ventilator.  Today he comes in on 4 L oxygen nighttime only been on room air today is 95%.  He has advanced COPD.  He had significant alcohol abuse he states he is no longer   drinking alcohol.  He still complains of bilateral rashes on the arms and  the umbilical area.  See shortness of breath assessment below  11/19/2019 This patient is seen again in short-term follow-up after having just been seen last week.  He came to the emergency room on 12 November in acute distress.  He was given high-dose IV steroids intensive neb treatments and discharged.  The patient states since taking the Diflucan and triamcinolone cream his rash is improved on his arms.  He states his breathing is getting back to baseline.  He still using oxygen 4 L at bedtime.  He still has thick tenacious mucus with thick yellow mucus.  He is off all antibiotics.  He is on prednisone 40 mg daily.  The patient continues to adamantly refuse leaving his current housing.  He is not regularly checking his blood sugar at home.  See assessment below  12/03/2019 Short term f/u COPD   No smoking since 2014.  Still in current housing. No ETOH use  The patient today states his breathing is somewhat improved he has no cough he is off oxygen.  He does complain of right-sided wrist pain he cannot recall if he injured the wrist.  This is been going on for 1 week.  Patient states he has some pruritus but his rash is overall improved.  He is no longer drinking alcohol.  He is not smoking.  He is yet to receive a Covid vaccine.  He is due a flu vaccine at this visit.   02/13/2020 This patient is seen in return follow-up for COPD and bronchiectasis.  He states has not been drinking any alcohol in 3 months.  He still complains of a rash over his neck and forearms and has partially improved with steroid topically.  On arrival A1c is 6.3.  He does use oxygen 2 L at bedtime not during the day but as needed only.  He does have a portable oxygen concentrator  Patient's been compliant with his inhaled medications he still living the same housing as he had before  He has not been back to the emergency room or hospital since his last visit in November  04/14/20 This patient is seen in post  hospital follow-up.  He was just in the hospital again last month for several days with hypoxemia and bronchopneumonia.  He is still having dyspnea at this time still having pain in the lower back and the upper legs.  Still has excess mucus it is difficult to expectorate.  Complains of bilateral ear stuffiness.  No other complaints.  He is maintaining his inhalers and oxygen.  He is still trying to do work around his house even though has been advised not to do so.  Below is a copy of the recent discharge summary Admit date: 03/26/2020 Discharge date: 03/28/2020  Admitted From: Home Disposition: Home  Recommendations for Outpatient Follow-up:  1. Follow up with PCP in 1 week with repeat CBC/BMP 2. Outpatient follow-up with pulmonary 3. 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 On presentation, he required 5 L oxygen via nasal cannula; chest x-ray showed chronic bronchiectatic markings versus bronchopneumonia atthe basis. He was started on Solu-Medrol and IV antibiotics.  During the hospitalization, his respiratory status has improved.  He is feeling better.  He wants to go home today.  He will be discharged home on oral prednisone and antibiotics.  Outpatient follow-up with PCP and pulmonary.   Discharge Diagnoses:   Acute on chronic hypoxic respiratory failure COPD exacerbation Probable community-acquired bacterial  pneumonia -Patient normally wears 2 to 4 L oxygen at home. He had COVID-19 infection in 10/2019 but is still not vaccinated against COVID-19 yet -Required 5 L oxygen via nasal cannula on presentation. chest x-ray showed chronic bronchiectatic markings versus bronchopneumonia atthe basis.COVID-19 and influenza testswere negative -Respiratory status improving: Currently on 2-4 L oxygen via nasal cannula. -Currently on Rocephin and Zithromax along with IV Solu-Medrol. Procalcitonin was less than 0.1 -He is feeling better.  He wants to go home today.  -Discharge home today on prednisone 40 mg daily for 7 days and Ceftin for 5 days. -Outpatient follow-up with PCP and pulmonary.  Elevated troponin -High-sensitivity troponin did not trend upwards. EKG without any ischemic changes. No chest pain. No further cardiac work-up needed  Diabetes mellitus type 2 -A1c 6.7.  Carb modified diet.  Continue home regimen.  Outpatient follow-up.  Hypertension -Continue metoprolol and losartan  History of alcohol abuse -Treated with CIWA protocol. Continue thiamine, folic acid and multivitamin.  Patient needs to abstain from alcohol  Generalized deconditioning -Will need home health PT.    Shortness of Breath This is a chronic problem. The current episode started more than 1 year ago. The problem occurs daily (qhs shortness of breath). The problem has been unchanged. Associated symptoms include wheezing. Pertinent negatives include no chest pain, claudication, fever, headaches, hemoptysis, leg pain, leg swelling, orthopnea, PND, rash, sore throat, sputum production, syncope or vomiting. The symptoms are aggravated by any activity, lying flat and occupational exposure. Associated symptoms comments: gerd  . Risk factors include smoking. He has tried beta agonist inhalers, oral steroids and steroid inhalers for the symptoms. The treatment provided moderate relief. His past medical history is  significant for COPD and pneumonia. There is no history of asthma, CAD, DVT, a heart failure or PE.    Past Medical History:  Diagnosis Date  . Acute on chronic respiratory failure with hypoxia (HCC) 06/08/2013  . CAP (community acquired pneumonia) 05/17/2018   See admit 05/17/18 ? rml  with   covid pcr neg - rx augmentin > f/u cxr in 4-6 weeks is fine unless condition declines   . Community acquired pneumonia of right lower lobe of lung 05/17/2018   See admit 05/17/18 ? rml  with   covid pcr neg - rx augmentin > f/u cxr in 4-6 weeks is fine unless condition declines   . COPD (chronic obstructive pulmonary disease) (HCC)    not on home  . Diabetes (HCC)   . Dyspnea   . Hypertension   . Pneumonia 04/06/2016  . Pneumonia due to COVID-19 virus 10/19/2019  . Pneumothorax    2016, fell from ladder  . 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 Tick bite 06/03/2018     Family History  Problem Relation Age of Onset  . Heart disease Father   . COPD Sister      Social History   Socioeconomic History  . Marital status: Divorced    Spouse name: Not on file  .   Number of children: Not on file  . Years of education: Not on file  . Highest education level: Not on file  Occupational History  . Occupation: Dealer  . Occupation: Painter  Tobacco Use  . Smoking status: Former Smoker    Packs/day: 1.50    Years: 40.00    Pack years: 60.00    Types: Cigarettes    Quit date: 2014    Years since quitting: 8.2  . Smokeless tobacco: Current User    Types: Chew  Vaping Use  . Vaping Use: Never used  Substance and Sexual Activity  . Alcohol use: Yes    Alcohol/week: 28.0 standard drinks    Types: 28 Cans of beer per week    Comment: 4 beers a night; + jitters with not drinking, denies DTs or seizures  . Drug use: Yes    Types: Marijuana    Comment: daily; quit using crack and methamphetamine about 5 months ago   . Sexual activity: Not Currently    Partners: Female  Other Topics Concern  .  Not on file  Social History Narrative   Lives alone near [place] Determinants of Health   Financial Resource Strain: Not on file  Food Insecurity: Not on file  Transportation Needs: Not on file  Physical Activity: Not on file  Stress: Not on file  Social Connections: Not on file  Intimate Partner Violence: Not on file     Allergies  Allergen Reactions  . Gabapentin Other (See Comments)    hallucinations     Outpatient Medications Prior to Visit  Medication Sig Dispense Refill  . albuterol (PROVENTIL) (2.5 MG/3ML) 0.083% nebulizer solution Take 3 mLs (2.5 mg total) by nebulization every 6 (six) hours as needed for wheezing or shortness of breath.    [name] albuterol (VENTOLIN HFA) 108 (90 Base) MCG/ACT inhaler Inhale 2 puffs into the lungs every 4 (four) hours as needed for wheezing or shortness of breath. 1 each 0  . budesonide-formoterol (SYMBICORT) 160-4.5 MCG/ACT inhaler INHALE 2 PUFFS INTO THE LUNGS 2 (TWO) TIMES DAILY. 10.2 g 12  . Respiratory Therapy Supplies (FLUTTER) DEVI Use 4 times daily (Patient taking differently: 1 each by Other route See admin instructions. Use 4 times daily) 1 each 0  . folic acid (FOLVITE) 1 MG tablet TAKE 1 TABLET (1 MG TOTAL) BY MOUTH DAILY. 30 tablet 0  . furosemide (LASIX) 20 MG tablet TAKE 1 TABLET (20 MG TOTAL) BY MOUTH DAILY AS NEEDED FOR EDEMA (LOWER EXTREMITY). 30 tablet 0  . losartan (COZAAR) 100 MG tablet TAKE 1 TABLET (100 MG TOTAL) BY MOUTH DAILY. 90 tablet 2  . metFORMIN (GLUCOPHAGE) 1000 MG tablet TAKE 1 TABLET (1,000 MG TOTAL) BY MOUTH 2 (TWO) TIMES DAILY WITH A MEAL. 60 tablet 0  . metoprolol succinate (TOPROL-XL) 25 MG 24 hr tablet TAKE 1 TABLET (25 MG TOTAL) BY MOUTH DAILY. 90 tablet 3  . pantoprazole (PROTONIX) 40 MG tablet Take 1 tablet (40 mg total) by mouth daily. 30 tablet 6  . thiamine 100 MG tablet TAKE 1 TABLET (100 MG TOTAL) BY MOUTH DAILY. 30 tablet 0  . Tiotropium Bromide Monohydrate 2.5 MCG/ACT AERS INHALE 2  PUFFS INTO THE LUNGS DAILY. 4 g 6  . Accu-Chek Softclix Lancets lancets Use as instructed to check blood sugar once daily. 100 each 2  . Blood Glucose Monitoring Suppl (ACCU-CHEK GUIDE ME) w/Device KIT 1 each by Other route in the morning and at bedtime. (Patient not taking: No  sig reported)    . glucose blood (TRUE METRIX BLOOD GLUCOSE TEST) test strip Use as instructed (Patient not taking: No sig reported) 100 each 12  . guaiFENesin (MUCINEX) 600 MG 12 hr tablet TAKE 1 TABLET (600 MG TOTAL) BY MOUTH 2 (TWO) TIMES DAILY. (Patient not taking: Reported on 04/14/2020) 30 tablet 0  . ketoconazole (NIZORAL) 2 % cream 1 (ONE) APPLICATION TO AFFECTED AREA TWICE A DAY (Patient not taking: No sig reported) 60 g 1  . Multiple Vitamin (MULTIVITAMIN WITH MINERALS) TABS tablet Take 1 tablet by mouth daily. (Patient not taking: No sig reported)    . azithromycin (ZITHROMAX) 250 MG tablet TAKE 2 TABLETS BY MOUTH ON DAY 1, THEN 1 TABLET BY ON DAYS 2 THROUGH 5. (Patient not taking: Reported on 04/14/2020) 6 tablet 0  . Blood Glucose Monitoring Suppl (TRUE METRIX METER) w/Device KIT Use to measure blood sugar twice a day (Patient not taking: No sig reported) 1 kit 0  . cefUROXime (CEFTIN) 500 MG tablet TAKE 1 TABLET (500 MG TOTAL) BY MOUTH 2 (TWO) TIMES DAILY FOR 5 DAYS. (Patient not taking: No sig reported) 10 tablet 0  . ciprofloxacin (CIPRO) 500 MG tablet TAKE 1 TABLET (500 MG TOTAL) BY MOUTH 2 (TWO) TIMES DAILY FOR 5 DAYS. (Patient not taking: No sig reported) 10 tablet 0  . fluconazole (DIFLUCAN) 100 MG tablet TAKE 1 TABLET (100 MG TOTAL) BY MOUTH DAILY FOR 7 DAYS. (Patient not taking: No sig reported) 7 tablet 0  . predniSONE (DELTASONE) 20 MG tablet TAKE 2 TABLETS (40 MG TOTAL) BY MOUTH DAILY WITH BREAKFAST FOR 7 DAYS. (Patient not taking: Reported on 04/14/2020) 14 tablet 0  . terbinafine (LAMISIL) 250 MG tablet TAKE 1 (ONE) TABLET ONCE A DAY (Patient not taking: No sig reported) 21 tablet 0  . traMADol (ULTRAM)  50 MG tablet TAKE 1 TABLET (50 MG TOTAL) BY MOUTH EVERY 8 (EIGHT) HOURS AS NEEDED FOR UP TO 5 DAYS. (Patient not taking: No sig reported) 15 tablet 0  . triamcinolone (KENALOG) 0.1 % APPLY 1 APPLICATION TOPICALLY 2 (TWO) TIMES DAILY. (Patient not taking: No sig reported) 30 g 0   No facility-administered medications prior to visit.     Review of Systems  Constitutional: Negative for fever.  HENT: Negative for sinus pressure, sinus pain, sore throat and trouble swallowing.   Eyes: Negative.   Respiratory: Positive for cough, shortness of breath and wheezing. Negative for hemoptysis, sputum production and chest tightness.   Cardiovascular: Negative for chest pain, palpitations, orthopnea, claudication, leg swelling, syncope and PND.  Gastrointestinal: Negative for nausea and vomiting.  Genitourinary: Negative.   Musculoskeletal: Positive for back pain.  Skin: Negative for rash.  Neurological: Negative for headaches.  Psychiatric/Behavioral: Negative for hallucinations. The patient is not nervous/anxious and is not hyperactive.        Objective:   Physical Exam Vitals:   04/14/20 0952  BP: 125/74  Pulse: 78  SpO2: 94%  Weight: 200 lb 9.6 oz (91 kg)  Height: 5' 9" (1.753 m)   Gen: Pleasant, well-nourished, in no distress,  normal affect  ENT: No lesions,  mouth clear,  oropharynx clear, no postnasal drip, edentulous  Neck: No JVD, no TMG, no carotid bruits  Lungs: No use of accessory muscles, no dullness to percussion, distant breath sounds without wheezes  Cardiovascular: RRR, heart sounds normal, no murmur or gallops, no peripheral edema  Abdomen: soft and NT, no HSM,  BS normal  Musculoskeletal: Edema and tenderness in the   lateral aspect of the right forearm with slight defect, no cyanosis or clubbing  Neuro: alert, non focal  Skin: Warm, no lesions , erythematous rash both forearms and umbilical area is improved   Disability eval at [facility]:   Pulmonary Function  Test (04/19/2019 1:42 PM EDT) Pulmonary Function Test (04/19/2019 1:42 PM EDT)  Component Value Ref Range Performed At Pathologist Signature  FVC Post 2.51 L [facility]   FEV1 Post 1.55 L [facility]   FEV1/FVC Post 61.87 % [facility]   FEF25-75% Post 0.90 L/s [facility]   PEF Post 3.42 L/s [facility]   MVV Post 42.00 L/min [facility]   FVC Pre 2.92 L [facility]   FEV1 Pre 1.57 L [facility]   FEV1/FVC Pre 54.01 % [facility]   FEF25-75% Pre 0.68 L/s [facility]   PEF Pre 4.14 L/s [facility]   MVV Pre 45.00 L/min [facility]   FVC_LLN 3.73  [facility]   FVC_Z-SCORE -2.84  [facility]   FVC_%PRED 60 % % [facility]   FVC_Z-SCORE -2.84  [facility]   FVC_%PRED 52 % % [facility]   FVC %Chng -14 % % [facility]   FEV1_LLN 2.86  [facility]   FEV1_Z-SCORE -3.78  [facility]   FEV1_%PRED 42 % % [facility]   FEV1_Z-SCORE -3.78  [facility]   FEV1_%PRED 41 % % [facility]   FEV1 %Chng -2 % % [facility]   FEV1/FVC_LLN 66  [facility]   FEV1/FVC %Chng 15 % % [facility]   FEF25-75%_LLN 1.60  [facility]   FEF25-75%_Z-SCORE -3.05  [facility]   FEF25-75%_%PRED 21 % % [facility]   FEF25-75%_Z-SCORE -3.05  [facility]   FEF25-75%_%PRED 28 % % [facility]   FEF25-75% %Chng 33 % % [facility]   PEF_LLN 7.25  [facility]   PEF_%PRED 43 % 36 % % [facility]   PEF %Chng -17  % % [facility]   MVV_LLN 130  [facility]   MVV_%PRED 35 % 32 % % [facility]   MVV %Chng -7 % % [facility]     Media Information    Document Information  Photos    11/02/2018 14:43  Attached To:  Eidan D Saulsbury  Source Information  ,  E, MD  [facility]   Pertinent Labs, Studies, and Procedures:  DG Chest Portable 1 View  Result Date: 09/22/2019 CLINICAL DATA:  Dyspnea. COPD exacerbation. Chest pain and shortness of breath. EXAM: PORTABLE CHEST 1 VIEW COMPARISON:  Cyst 07/05/2019 FINDINGS: Stable cardiomediastinal contours. Chronic blunting of the left costophrenic angle due to pleuroparenchymal scarring. Changes of emphysema noted with asymmetric increase hazy opacity within the right lung base concerning for pneumonia. Numerous remote bilateral rib fracture deformities. IMPRESSION: 1. Increased hazy opacity within the right lung base concerning for pneumonia. 2. Emphysema. Electronically Signed   By: Taylor  Stroud M.D.   On: 09/22/2019 04:20     BMP Latest Ref Rng & Units 03/28/2020 03/27/2020 03/26/2020    Glucose 70 - 99 mg/dL 197(H) 195(H) 150(H)  BUN 6 - 20 mg/dL 23(H) 16 12  Creatinine 0.61 - 1.24 mg/dL 0.80 0.80 0.80  BUN/Creat Ratio 9 - 20 - - -  Sodium 135 - 145 mmol/L 136 138 144  Potassium 3.5 - 5.1 mmol/L 4.7 4.5 4.0  Chloride 98 - 111 mmol/L 100 101 105  CO2 22 - 32 mmol/L 28 30 -  Calcium 8.9 - 10.3 mg/dL 9.5 9.7 -   Hepatic Function Panel     Component Value Date/Time   PROT 5.9 (L) 03/27/2020 0141   PROT 5.9 (L) 07/23/2019 0900   ALBUMIN 3.5 03/27/2020 0141   ALBUMIN 4.0 07/23/2019 0900   AST 17 03/27/2020 0141   ALT 20 03/27/2020 0141   ALT 62 (H) 06/23/2016 1226   ALKPHOS 33 (L) 03/27/2020 0141   BILITOT 0.3 03/27/2020 0141   BILITOT <0.2 07/23/2019 0900   BILIDIR <0.1 10/20/2019 0050   BILIDIR 0.12 04/16/2016 1601   IBILI NOT CALCULATED 10/20/2019  0050   CBC Latest Ref Rng & Units 03/28/2020 03/27/2020 03/26/2020  WBC 4.0 - 10.5 K/uL 11.7(H) 11.7(H) -  Hemoglobin 13.0 - 17.0 g/dL 12.2(L) 12.1(L) 13.3  Hematocrit 39.0 - 52.0 % 38.9(L) 38.9(L) 39.0  Platelets 150 - 400 K/uL 249 269 -       Assessment & Plan:  I personally reviewed all images and lab data in the [facility] system as well as any outside material available during this office visit and agree with the  radiology impressions.   Hypertension Stable off medications except for furosemide  COPD with acute exacerbation (HCC) Recurrent COPD exacerbation improved with we will continue slow prednisone taper  Chronic respiratory failure with hypoxia ([place]) Patient advised continue oxygen  CAP (community acquired pneumonia) Resolved  Type 2 diabetes mellitus without complication, without long-term current use of insulin ([place]) Continue Metformin   Osa was seen today for back pain.  Diagnoses and all orders for this visit:  Essential hypertension -     losartan (COZAAR) 100 MG tablet; TAKE 1 TABLET (100 MG TOTAL) BY MOUTH DAILY.  Primary hypertension  COPD with acute exacerbation (HCC)  Chronic respiratory failure with hypoxia ([place])  Community acquired pneumonia, unspecified laterality  Type 2 diabetes mellitus without complication, without long-term current use of insulin (HCC)  Other orders -     furosemide (LASIX) 20 MG tablet; TAKE 1 TABLET (20 MG TOTAL) BY MOUTH DAILY AS NEEDED FOR EDEMA (LOWER EXTREMITY). -     metFORMIN (GLUCOPHAGE) 1000 MG tablet; Take 1 tablet (1,000 mg total) by mouth 2 (two) times daily with a meal. -     metoprolol succinate (TOPROL-XL) 25 MG 24 hr tablet; TAKE 1 TABLET (25 MG TOTAL) BY MOUTH DAILY. -     pantoprazole (PROTONIX) 40 MG tablet; Take 1 tablet (40 mg total) by mouth daily. -     Tiotropium Bromide Monohydrate 2.5 MCG/ACT AERS; INHALE 2 PUFFS INTO THE LUNGS DAILY. -     predniSONE (DELTASONE) 10 MG tablet; Take 4 tablets daily  for 5 days then two daily and stay   Patient again persists in saying he will not receive a COVID vaccine  I spent 43 minutes on this patient reviewing his hospital records and going over patient instructions and examining him in complex medical decision making

## 2020-04-14 ENCOUNTER — Encounter: Payer: Self-pay | Admitting: Critical Care Medicine

## 2020-04-14 ENCOUNTER — Ambulatory Visit: Payer: MEDICAID | Attending: Critical Care Medicine | Admitting: Critical Care Medicine

## 2020-04-14 ENCOUNTER — Other Ambulatory Visit: Payer: Self-pay

## 2020-04-14 VITALS — BP 125/74 | HR 78 | Ht 69.0 in | Wt 200.6 lb

## 2020-04-14 DIAGNOSIS — J189 Pneumonia, unspecified organism: Secondary | ICD-10-CM

## 2020-04-14 DIAGNOSIS — I10 Essential (primary) hypertension: Secondary | ICD-10-CM

## 2020-04-14 DIAGNOSIS — J9611 Chronic respiratory failure with hypoxia: Secondary | ICD-10-CM

## 2020-04-14 DIAGNOSIS — J441 Chronic obstructive pulmonary disease with (acute) exacerbation: Secondary | ICD-10-CM | POA: Diagnosis not present

## 2020-04-14 DIAGNOSIS — E119 Type 2 diabetes mellitus without complications: Secondary | ICD-10-CM | POA: Diagnosis not present

## 2020-04-14 MED ORDER — METFORMIN HCL 1000 MG PO TABS
1000.0000 mg | ORAL_TABLET | Freq: Two times a day (BID) | ORAL | 1 refills | Status: DC
  Filled 2020-04-14: qty 60, 30d supply, fill #0

## 2020-04-14 MED ORDER — METOPROLOL SUCCINATE ER 25 MG PO TB24
ORAL_TABLET | Freq: Every day | ORAL | 3 refills | Status: DC
  Filled 2020-04-14: qty 30, 30d supply, fill #0

## 2020-04-14 MED ORDER — TIOTROPIUM BROMIDE MONOHYDRATE 2.5 MCG/ACT IN AERS
2.0000 | INHALATION_SPRAY | Freq: Every day | RESPIRATORY_TRACT | 6 refills | Status: DC
  Filled 2020-04-14: qty 4, 30d supply, fill #0

## 2020-04-14 MED ORDER — PANTOPRAZOLE SODIUM 40 MG PO TBEC
40.0000 mg | DELAYED_RELEASE_TABLET | Freq: Every day | ORAL | 6 refills | Status: DC
  Filled 2020-04-14: qty 30, 30d supply, fill #0

## 2020-04-14 MED ORDER — LOSARTAN POTASSIUM 100 MG PO TABS
ORAL_TABLET | Freq: Every day | ORAL | 2 refills | Status: DC
  Filled 2020-04-14: qty 30, 30d supply, fill #0

## 2020-04-14 MED ORDER — PREDNISONE 10 MG PO TABS
ORAL_TABLET | ORAL | 1 refills | Status: DC
  Filled 2020-04-14: qty 48, 34d supply, fill #0

## 2020-04-14 MED ORDER — FUROSEMIDE 20 MG PO TABS
ORAL_TABLET | ORAL | 0 refills | Status: DC
  Filled 2020-04-14 – 2020-05-14 (×2): qty 30, 30d supply, fill #0

## 2020-04-14 NOTE — Patient Instructions (Signed)
Resume prednisone take 4 a day for 5 days then reduce to 2 a day and stay  Refills on all your other medications sent to our pharmacy  Continue to take your losartan daily for blood pressure  Use the oxygen at night and as needed during the daytime  Your inhalers are the Symbicort and the Spiriva take these daily as prescribed  Return to see [name] in 1 month

## 2020-04-14 NOTE — Assessment & Plan Note (Signed)
Resolved

## 2020-04-14 NOTE — Assessment & Plan Note (Signed)
Recurrent COPD exacerbation improved with we will continue slow prednisone taper

## 2020-04-14 NOTE — Assessment & Plan Note (Signed)
Patient advised continue oxygen

## 2020-04-14 NOTE — Assessment & Plan Note (Signed)
Continue Metformin

## 2020-04-14 NOTE — Progress Notes (Signed)
Having pain in back and legs.

## 2020-04-14 NOTE — Assessment & Plan Note (Addendum)
Stable off medications except for furosemide and metoprolol

## 2020-04-15 ENCOUNTER — Other Ambulatory Visit: Payer: Self-pay

## 2020-04-16 ENCOUNTER — Other Ambulatory Visit: Payer: Self-pay

## 2020-04-16 ENCOUNTER — Other Ambulatory Visit: Payer: Self-pay | Admitting: *Deleted

## 2020-04-16 NOTE — Patient Outreach (Signed)
Onboarded patient to Pharmacy and requested refills from [facility]. First delivery scheduled for 04/25/20

## 2020-04-16 NOTE — Patient Instructions (Signed)
Visit Information  James [name] was given information about Medicaid Managed Care team care coordination services as a part of their Healthy [facility] Medicaid benefit. James [name] verbally consented to engagement with the [facility] Managed Care team.   For questions related to your Healthy [facility] health plan, please call: [phone] or visit the homepage here: [email or web address]  If you would like to schedule transportation through your Healthy [facility] plan, please call the following number at least 2 days in advance of your appointment: [phone]   Call the [place] at [phone], at any time, 24 hours a day, 7 days a week. If you are in danger or need immediate medical attention call 911.  James [name] - following are the goals we discussed in your visit today:  Goals Addressed            This Visit's Progress   . Manage Fatigue (Tiredness-COPD)       Follow Up Date 05/20/20   - develop a new routine to improve sleep - get at least 7 to 8 hours of sleep at night - keep room cool and dark - practice relaxation or meditation daily    Why is this important?    Feeling tired or worn out is a common symptom of COPD (chronic obstructive pulmonary disease).   Learning when you feel your best and when you need rest is important.   Managing the tiredness (fatigue) will help you be active and enjoy life.         . Track and Manage My Triggers-COPD       Timeframe:  Long-Range Goal Priority:  High Start Date:  04/09/20                           Expected End Date:   06/09/20                    Follow Up Date 05/20/20   - avoid outdoors during the warmest part of the day - avoid second hand smoke - identify and remove indoor air pollutants - limit outdoor activity during cold weather - listen for public air quality announcements every day  - take prescribed medications as directed - call PCP with new concerns or  questions   Why is this important?    Triggers are activities or things, like tobacco smoke or cold weather, that make your COPD (chronic obstructive pulmonary disease) flare-up.   Knowing these triggers helps you plan how to stay away from them.   When you cannot remove them, you can learn how to manage them.            Please see education materials related to COPD provided as print materials.     Chronic Obstructive Pulmonary Disease Exacerbation Chronic obstructive pulmonary disease (COPD) is a long-term (chronic) lung problem. In COPD, the flow of air from the lungs is limited. COPD exacerbations are times that breathing gets worse and you need more than your normal treatment. Without treatment, they can be life threatening. If they happen often, your lungs can become more damaged. If your COPD gets worse, your doctor may treat you with:  Medicines.  Oxygen.  Different ways to clear your airway, such as using a mask. Follow these instructions at home: Medicines  Take over-the-counter and prescription medicines only as told by your doctor.  If you take an antibiotic or steroid medicine, do not stop taking the  medicine even if you start to feel better.  Keep up with shots (vaccinations) as told by your doctor. Be sure to get a yearly (annual) flu shot. Lifestyle  Do not smoke. If you need help quitting, ask your doctor.  Eat healthy foods.  Exercise regularly.  Get plenty of sleep.  Avoid tobacco smoke and other things that can bother your lungs.  Wash your hands often with soap and water. This will help keep you from getting an infection. If you cannot use soap and water, use hand sanitizer.  During flu season, avoid areas that are crowded with people. General instructions  Drink enough fluid to keep your pee (urine) clear or pale yellow. Do not do this if your doctor has told you not to.  Use a cool mist machine (vaporizer).  If you use oxygen or a machine  that turns medicine into a mist (nebulizer), continue to use it as told.  Follow all instructions for rehabilitation. These are steps you can take to make your body work better.  Keep all follow-up visits as told by your doctor. This is important. Contact a doctor if:  Your COPD symptoms get worse than normal. Get help right away if:  You are short of breath and it gets worse.  You have trouble talking.  You have chest pain.  You cough up blood.  You have a fever.  You keep throwing up (vomiting).  You feel weak or you pass out (faint).  You feel confused.  You are not able to sleep because of your symptoms.  You are not able to do daily activities. Summary  COPD exacerbations are times that breathing gets worse and you need more treatment than normal.  COPD exacerbations can be very serious and may cause your lungs to become more damaged.  Do not smoke. If you need help quitting, ask your doctor.  Stay up-to-date on your shots. Get a flu shot every year. This information is not intended to replace advice given to you by your health care provider. Make sure you discuss any questions you have with your health care provider. Document Revised: 12/03/2016 Document Reviewed: 01/26/2016 Elsevier Patient Education  2021 [employer].   The patient verbalized understanding of instructions provided today and agreed to receive a mailed copy of patient instruction and/or educational materials.  The patient has been provided with contact information for the Managed Medicaid care management team and has been advised to call with any health related questions or concerns.  Telephone follow up appointment with Managed Medicaid care management team member scheduled for:05/20/20 @ 3:15  [name] RN, BSN [place] RN Care Coordinator   Following is a copy of your plan of care:  Patient Care Plan: COPD (Adult)    Problem Identified: Symptom  Exacerbation (COPD)     Long-Range Goal: Symptom Exacerbation Prevented or Minimized   Start Date: 04/09/2020  Expected End Date: 06/09/2020  This Visit's Progress: On track  Recent Progress: On track  Priority: High  Note:   Current Barriers:  [name] Knowledge deficits related to basic COPD self care/management-James [name] was recently admitted for COPD exacerbation and pnuemonia. He reports taking his medications, wearing oxygen continuously, and has a follow up with his PCP on 04/14/20. He does report having difficulty at times swallowing food and liquid, pain in back and legs, and difficulty falling asleep. He is planning to discuss these issues with his PCP. James [name] reports having transportation and plenty of  food. He lives alone, and has support from his sister. James [name] attended his follow up with PCP. He was working outside at his home today and reports doing "about the same". He reports increased difficulty breathing when it is hot outside. He has his medications and oxygen, reports no needs at this time. He is working with [facility] pharmacist for medication management. . Does not contact provider office for questions/concerns  Case Manager Clinical Goal(s):  patient will report using inhalers as prescribed including rinsing mouth after use  patient will engage in lite exercise as tolerated to build/regain stamina and strength and reduce shortness of breath through activity tolerance  patient will verbalize basic understanding of COPD disease process and self care activities  patient will not be hospitalized for COPD exacerbation as evidenced   Patient will work with [facility] pharmacist for medication management-[facility]  Patient will attend all scheduled appointments Interventions:  . Inter-disciplinary care team collaboration (see longitudinal plan of care)  Provided patient with basic written and verbal COPD education on self care/management/and exacerbation prevention   Provided instruction about  proper use of medications used for management of COPD including inhalers  Provided patient with education about the role of exercise in the management of COPD  Advised patient to engage in light exercise as tolerated 3-5 days a week  Provided education about and advised patient to utilize infection prevention strategies to reduce risk of respiratory infection  Patient Goals/Self-Care Activities:  . - avoid outdoors during the warmest part of the day . - avoid second hand smoke . - identify and remove indoor air pollutants . - limit outdoor activity during cold weather . - listen for public air quality announcements every day  . - take prescribed medications as directed . - call PCP with new concerns or questions . - develop a new routine to improve sleep . - get at least 7 to 8 hours of sleep at night . - keep room cool and dark . - practice relaxation or meditation daily  Follow Up Plan: Telephone follow up appointment with care management team member scheduled for:05/20/20 @ 3:15pm    Patient Care Plan: Medication Management    Problem Identified: Health Promotion or Disease Self-Management (General Plan of Care)     Goal: Medication Management   Note:   Current Barriers:  . Unable to independently monitor therapeutic efficacy . Unable to self administer medications as prescribed . Does not adhere to prescribed medication regimen . Does not maintain contact with provider office . Does not contact provider office for questions/concerns .   Pharmacist Clinical Goal(s):  [name] Over the next 30 days, patient will contact provider office for questions/concerns as evidenced notation of same in electronic health record through collaboration with PharmD and provider.  .   Interventions: . Inter-disciplinary care team collaboration (see longitudinal plan of care) . Comprehensive medication review performed; medication list updated in electronic medical  record  @RXCPDIABETES @ @RXCPHYPERTENSION @ @RXCPHYPERLIPIDEMIA @ Health Maintenance  Patient Goals/Self-Care Activities . Over the next 30 days, patient will:  - collaborate with provider on medication access solutions  Follow Up Plan: The care management team will reach out to the patient again over the next 30 days.

## 2020-04-16 NOTE — Patient Outreach (Signed)
Medicaid Managed Care   Nurse Care Manager Note  04/16/2020 Name:  James [name] MRN:  [id number] DOB:  [birth year]  James [name] is an 60 y.o. year old male who is a primary patient of James Gaskins Burnett Harry, MD.  The [facility] Managed Care Coordination team was consulted for assistance with:    HTN COPD DMII  James [name] was given information about Medicaid Managed Care Coordination team services today. James [name] agreed to services and verbal consent obtained.  Engaged with patient by telephone for follow up visit in response to provider referral for case management and/or care coordination services.   Assessments/Interventions:  Review of past medical history, allergies, medications, health status, including review of consultants reports, laboratory and other test data, was performed as part of comprehensive evaluation and provision of chronic care management services.  SDOH (Social Determinants of Health) assessments and interventions performed:   Care Plan  Allergies  Allergen Reactions  . Gabapentin Other (See Comments)    hallucinations    Medications Reviewed Today    Reviewed by [name], MD (Physician) on 04/14/20 at 1309  Med List Status: <None>  Medication Order Taking? Sig Documenting Provider Last Dose Status Informant  Accu-Chek Softclix Lancets lancets [id number]  Use as instructed to check blood sugar once daily. [name], MD  Active Self  albuterol (PROVENTIL) (2.5 MG/3ML) 0.083% nebulizer solution [id number] Yes Take 3 mLs (2.5 mg total) by nebulization every 6 (six) hours as needed for wheezing or shortness of breath. [name], MD Taking Active   albuterol (VENTOLIN HFA) 108 (90 Base) MCG/ACT inhaler [id number] Yes Inhale 2 puffs into the lungs every 4 (four) hours as needed for wheezing or shortness of breath. Ward, Delice Bison, DO Taking Active Self  Blood Glucose Monitoring Suppl (ACCU-CHEK GUIDE ME) w/Device KIT [id number] No 1 each by Other  route in the morning and at bedtime.  Patient not taking: No sig reported   [provider] Not Taking Active   budesonide-formoterol (SYMBICORT) 160-4.5 MCG/ACT inhaler [id number] Yes INHALE 2 PUFFS INTO THE LUNGS 2 (TWO) TIMES DAILY. [name], MD Taking Active   furosemide (LASIX) 20 MG tablet [id number]  TAKE 1 TABLET (20 MG TOTAL) BY MOUTH DAILY AS NEEDED FOR EDEMA (LOWER EXTREMITY). [name], MD  Active   glucose blood (TRUE METRIX BLOOD GLUCOSE TEST) test strip [id number] No Use as instructed  Patient not taking: No sig reported   [name], MD Not Taking Active   guaiFENesin (MUCINEX) 600 MG 12 hr tablet [id number] No TAKE 1 TABLET (600 MG TOTAL) BY MOUTH 2 (TWO) TIMES DAILY.  Patient not taking: Reported on 04/14/2020   [name], MD Not Taking Active   ketoconazole (NIZORAL) 2 % cream [id number] No 1 (ONE) APPLICATION TO AFFECTED AREA TWICE A DAY  Patient not taking: No sig reported   [name], MD Not Taking Active   losartan (COZAAR) 100 MG tablet [id number]  TAKE 1 TABLET (100 MG TOTAL) BY MOUTH DAILY. [name], MD  Active   metFORMIN (GLUCOPHAGE) 1000 MG tablet [id number]  Take 1 tablet (1,000 mg total) by mouth 2 (two) times daily with a meal. [name], MD  Active   metoprolol succinate (TOPROL-XL) 25 MG 24 hr tablet [id number]  TAKE 1 TABLET (25 MG TOTAL) BY MOUTH DAILY. [name], MD  Active   Multiple Vitamin (MULTIVITAMIN WITH MINERALS) TABS tablet [id number] No Take 1 tablet by mouth daily.  Patient not taking: No sig reported   [name], MD Not Taking Active   pantoprazole (PROTONIX) 40 MG tablet [id number]  Take 1 tablet (40 mg total) by mouth daily. [name], MD  Active   predniSONE (DELTASONE) 10 MG tablet [id number] Yes Take 4 tablets daily for 5 days then two daily and stay [name], MD  Active   Respiratory Therapy Supplies (FLUTTER) DEVI [id number] Yes Use 4 times daily  Patient taking  differently: 1 each by Other route See admin instructions. Use 4 times daily   [name], MD Taking Active Family Member  Tiotropium Bromide Monohydrate 2.5 MCG/ACT AERS [id number]  INHALE 2 PUFFS INTO THE LUNGS DAILY. [name], MD  Active           Patient Active Problem List   Diagnosis Date Noted  . COPD with acute exacerbation ([place]) 03/26/2020  . Pain of right forearm 12/03/2019  . Bronchiectasis ([place]) 09/24/2019  . Type 2 diabetes mellitus without complication, without long-term current use of insulin ([place]) 07/24/2019  . Other atopic dermatitis 05/01/2019  . Chronic respiratory failure with hypoxia ([place]) 04/23/2019  . Intellectual disability 04/23/2019  . History of alcohol use 11/23/2018  . Hypertension 05/17/2016  . History of tobacco abuse 04/06/2016  . COPD mixed type ([place]) 03/26/2016    Conditions to be addressed/monitored per PCP order:  HTN, COPD and DMII  Care Plan : COPD (Adult)  Updates made by [name], RN since 04/16/2020 12:00 AM    Problem: Symptom Exacerbation (COPD)     Long-Range Goal: Symptom Exacerbation Prevented or Minimized   Start Date: 04/09/2020  Expected End Date: 06/09/2020  This Visit's Progress: On track  Recent Progress: On track  Priority: High  Note:   Current Barriers:  [name] Knowledge deficits related to basic COPD self care/management-James [name] was recently admitted for COPD exacerbation and pnuemonia. He reports taking his medications, wearing oxygen continuously, and has a follow up with his PCP on 04/14/20. He does report having difficulty at times swallowing food and liquid, pain in back and legs, and difficulty falling asleep. He is planning to discuss these issues with his PCP. James [name] reports having transportation and plenty of food. He lives alone, and has support from his sister. James [name] attended his follow up with PCP. He was working outside at his home today and reports doing "about the same". He reports  increased difficulty breathing when it is hot outside. He has his medications and oxygen, reports no needs at this time. He is working with [facility] pharmacist for medication management. . Does not contact provider office for questions/concerns  Case Manager Clinical Goal(s):  patient will report using inhalers as prescribed including rinsing mouth after use  patient will engage in lite exercise as tolerated to build/regain stamina and strength and reduce shortness of breath through activity tolerance  patient will verbalize basic understanding of COPD disease process and self care activities  patient will not be hospitalized for COPD exacerbation as evidenced   Patient will work with [facility] pharmacist for medication management-[facility]  Patient will attend all scheduled appointments Interventions:  . Inter-disciplinary care team collaboration (see longitudinal plan of care)  Provided patient with basic written and verbal COPD education on self care/management/and exacerbation prevention   Provided instruction about proper use of medications used for management of COPD including inhalers  Provided patient with education about the role of exercise in the management of COPD  Advised patient to  engage in light exercise as tolerated 3-5 days a week  Provided education about and advised patient to utilize infection prevention strategies to reduce risk of respiratory infection  Patient Goals/Self-Care Activities:  . - avoid outdoors during the warmest part of the day . - avoid second hand smoke . - identify and remove indoor air pollutants . - limit outdoor activity during cold weather . - listen for public air quality announcements every day  . - take prescribed medications as directed . - call PCP with new concerns or questions . - develop a new routine to improve sleep . - get at least 7 to 8 hours of sleep at night . - keep room cool and dark . - practice relaxation or meditation daily  Follow  Up Plan: Telephone follow up appointment with care management team member scheduled for:05/20/20 @ 3:15pm      Follow Up:  Patient agrees to Care Plan and Follow-up.  Plan: The Managed Medicaid care management team will reach out to the patient again over the next 30 days. and The patient has been provided with contact information for the Managed Medicaid care management team and has been advised to call with any health related questions or concerns.  Date/time of next scheduled RN care management/care coordination outreach: 05/20/20 @ 3:15pm  [name] RN, [place] RN Care Coordinator

## 2020-04-16 NOTE — Progress Notes (Signed)
[facility]:  Can you send new scripts?  [place] T. Audie Box, MD, [place]  [address] [place], [place] [address] [phone]  8:06 PM

## 2020-04-17 ENCOUNTER — Telehealth: Payer: Self-pay | Admitting: Critical Care Medicine

## 2020-04-17 ENCOUNTER — Other Ambulatory Visit: Payer: Self-pay

## 2020-04-17 DIAGNOSIS — I10 Essential (primary) hypertension: Secondary | ICD-10-CM

## 2020-04-17 MED ORDER — LOSARTAN POTASSIUM 100 MG PO TABS
ORAL_TABLET | Freq: Every day | ORAL | 2 refills | Status: DC
  Filled 2020-05-20: qty 90, 90d supply, fill #0

## 2020-04-17 MED ORDER — METOPROLOL SUCCINATE ER 25 MG PO TB24
ORAL_TABLET | Freq: Every day | ORAL | 3 refills | Status: DC
  Filled 2020-05-20: qty 90, 90d supply, fill #0

## 2020-04-17 MED ORDER — PREDNISONE 10 MG PO TABS: ORAL_TABLET | ORAL | 1 refills | Status: DC

## 2020-04-17 MED ORDER — TIOTROPIUM BROMIDE MONOHYDRATE 2.5 MCG/ACT IN AERS
2.0000 | INHALATION_SPRAY | Freq: Every day | RESPIRATORY_TRACT | 6 refills | Status: DC
  Filled 2020-05-20: qty 4, 30d supply, fill #0

## 2020-04-17 MED ORDER — PANTOPRAZOLE SODIUM 40 MG PO TBEC
40.0000 mg | DELAYED_RELEASE_TABLET | Freq: Every day | ORAL | 6 refills | Status: DC
  Filled 2020-05-20: qty 90, 90d supply, fill #0

## 2020-04-17 MED ORDER — BUDESONIDE-FORMOTEROL FUMARATE 160-4.5 MCG/ACT IN AERO
2.0000 | INHALATION_SPRAY | Freq: Two times a day (BID) | RESPIRATORY_TRACT | 12 refills | Status: DC
  Filled 2020-05-20: qty 10.2, 30d supply, fill #0

## 2020-04-17 MED ORDER — METFORMIN HCL 1000 MG PO TABS
1000.0000 mg | ORAL_TABLET | Freq: Two times a day (BID) | ORAL | 1 refills | Status: DC
  Filled 2020-05-20: qty 60, 30d supply, fill #0

## 2020-04-17 NOTE — Telephone Encounter (Signed)
-----   Message from [name], [facility] sent at 04/16/2020 12:06 PM EDT ----- Afternoon!  My name is Jacqulyn Cane the PharmD on the Managed Medicaid team. I believe you spoke with [name], James [name]'s sister, and she explained that I was going to start packaging his meds and delivering them for free?  In order for me to do this, could you please send new scripts to his new [facility]  Thanks so much!!

## 2020-04-19 ENCOUNTER — Encounter (HOSPITAL_COMMUNITY): Payer: Self-pay

## 2020-04-19 ENCOUNTER — Emergency Department (HOSPITAL_COMMUNITY): Payer: Medicaid Other

## 2020-04-19 ENCOUNTER — Inpatient Hospital Stay (HOSPITAL_COMMUNITY)
Admission: EM | Admit: 2020-04-19 | Discharge: 2020-04-24 | DRG: 871 | Disposition: A | Discharge status: Home Health | Payer: Medicaid Other | Attending: Family Medicine | Admitting: Family Medicine

## 2020-04-19 ENCOUNTER — Other Ambulatory Visit: Payer: Self-pay

## 2020-04-19 DIAGNOSIS — R0603 Acute respiratory distress: Secondary | ICD-10-CM

## 2020-04-19 DIAGNOSIS — F10231 Alcohol dependence with withdrawal delirium: Secondary | ICD-10-CM | POA: Diagnosis not present

## 2020-04-19 DIAGNOSIS — I10 Essential (primary) hypertension: Secondary | ICD-10-CM | POA: Diagnosis present

## 2020-04-19 DIAGNOSIS — Z8701 Personal history of pneumonia (recurrent): Secondary | ICD-10-CM | POA: Diagnosis not present

## 2020-04-19 DIAGNOSIS — R0602 Shortness of breath: Secondary | ICD-10-CM | POA: Diagnosis not present

## 2020-04-19 DIAGNOSIS — A419 Sepsis, unspecified organism: Secondary | ICD-10-CM | POA: Diagnosis not present

## 2020-04-19 DIAGNOSIS — J80 Acute respiratory distress syndrome: Secondary | ICD-10-CM | POA: Diagnosis not present

## 2020-04-19 DIAGNOSIS — E669 Obesity, unspecified: Secondary | ICD-10-CM | POA: Diagnosis not present

## 2020-04-19 DIAGNOSIS — Z79899 Other long term (current) drug therapy: Secondary | ICD-10-CM

## 2020-04-19 DIAGNOSIS — J9620 Acute and chronic respiratory failure, unspecified whether with hypoxia or hypercapnia: Secondary | ICD-10-CM | POA: Diagnosis present

## 2020-04-19 DIAGNOSIS — F10931 Alcohol use, unspecified with withdrawal delirium: Secondary | ICD-10-CM

## 2020-04-19 DIAGNOSIS — Y95 Nosocomial condition: Secondary | ICD-10-CM | POA: Diagnosis present

## 2020-04-19 DIAGNOSIS — Z515 Encounter for palliative care: Secondary | ICD-10-CM | POA: Diagnosis not present

## 2020-04-19 DIAGNOSIS — Z825 Family history of asthma and other chronic lower respiratory diseases: Secondary | ICD-10-CM

## 2020-04-19 DIAGNOSIS — E1165 Type 2 diabetes mellitus with hyperglycemia: Secondary | ICD-10-CM | POA: Diagnosis not present

## 2020-04-19 DIAGNOSIS — E119 Type 2 diabetes mellitus without complications: Secondary | ICD-10-CM | POA: Diagnosis not present

## 2020-04-19 DIAGNOSIS — J9601 Acute respiratory failure with hypoxia: Secondary | ICD-10-CM | POA: Diagnosis not present

## 2020-04-19 DIAGNOSIS — Z781 Physical restraint status: Secondary | ICD-10-CM | POA: Diagnosis not present

## 2020-04-19 DIAGNOSIS — J189 Pneumonia, unspecified organism: Secondary | ICD-10-CM | POA: Diagnosis not present

## 2020-04-19 DIAGNOSIS — Z888 Allergy status to other drugs, medicaments and biological substances status: Secondary | ICD-10-CM

## 2020-04-19 DIAGNOSIS — S2243XA Multiple fractures of ribs, bilateral, initial encounter for closed fracture: Secondary | ICD-10-CM | POA: Diagnosis not present

## 2020-04-19 DIAGNOSIS — J9611 Chronic respiratory failure with hypoxia: Secondary | ICD-10-CM | POA: Diagnosis present

## 2020-04-19 DIAGNOSIS — Z87820 Personal history of traumatic brain injury: Secondary | ICD-10-CM

## 2020-04-19 DIAGNOSIS — Z28310 Unvaccinated for covid-19: Secondary | ICD-10-CM

## 2020-04-19 DIAGNOSIS — Z7984 Long term (current) use of oral hypoglycemic drugs: Secondary | ICD-10-CM

## 2020-04-19 DIAGNOSIS — J9621 Acute and chronic respiratory failure with hypoxia: Secondary | ICD-10-CM | POA: Diagnosis not present

## 2020-04-19 DIAGNOSIS — E872 Acidosis, unspecified: Secondary | ICD-10-CM

## 2020-04-19 DIAGNOSIS — Z8249 Family history of ischemic heart disease and other diseases of the circulatory system: Secondary | ICD-10-CM | POA: Diagnosis not present

## 2020-04-19 DIAGNOSIS — E66811 Obesity, class 1: Secondary | ICD-10-CM

## 2020-04-19 DIAGNOSIS — Z7189 Other specified counseling: Secondary | ICD-10-CM | POA: Diagnosis not present

## 2020-04-19 DIAGNOSIS — R000 Tachycardia, unspecified: Secondary | ICD-10-CM | POA: Diagnosis not present

## 2020-04-19 DIAGNOSIS — Z7951 Long term (current) use of inhaled steroids: Secondary | ICD-10-CM

## 2020-04-19 DIAGNOSIS — R6520 Severe sepsis without septic shock: Secondary | ICD-10-CM | POA: Diagnosis present

## 2020-04-19 DIAGNOSIS — J181 Lobar pneumonia, unspecified organism: Secondary | ICD-10-CM | POA: Diagnosis not present

## 2020-04-19 DIAGNOSIS — Z720 Tobacco use: Secondary | ICD-10-CM

## 2020-04-19 DIAGNOSIS — Z9981 Dependence on supplemental oxygen: Secondary | ICD-10-CM

## 2020-04-19 DIAGNOSIS — R0902 Hypoxemia: Secondary | ICD-10-CM | POA: Diagnosis not present

## 2020-04-19 DIAGNOSIS — Z6830 Body mass index (BMI) 30.0-30.9, adult: Secondary | ICD-10-CM

## 2020-04-19 DIAGNOSIS — R062 Wheezing: Secondary | ICD-10-CM | POA: Diagnosis not present

## 2020-04-19 DIAGNOSIS — Z20822 Contact with and (suspected) exposure to covid-19: Secondary | ICD-10-CM | POA: Diagnosis present

## 2020-04-19 DIAGNOSIS — J441 Chronic obstructive pulmonary disease with (acute) exacerbation: Secondary | ICD-10-CM | POA: Diagnosis present

## 2020-04-19 DIAGNOSIS — R0689 Other abnormalities of breathing: Secondary | ICD-10-CM | POA: Diagnosis not present

## 2020-04-19 DIAGNOSIS — Z8616 Personal history of COVID-19: Secondary | ICD-10-CM | POA: Diagnosis not present

## 2020-04-19 DIAGNOSIS — Z87898 Personal history of other specified conditions: Secondary | ICD-10-CM

## 2020-04-19 DIAGNOSIS — Z8709 Personal history of other diseases of the respiratory system: Secondary | ICD-10-CM | POA: Diagnosis not present

## 2020-04-19 DIAGNOSIS — F1090 Alcohol use, unspecified, uncomplicated: Secondary | ICD-10-CM

## 2020-04-19 DIAGNOSIS — J431 Panlobular emphysema: Secondary | ICD-10-CM | POA: Diagnosis not present

## 2020-04-19 LAB — COMPREHENSIVE METABOLIC PANEL
ALT: 29 U/L (ref 0–44)
AST: 31 U/L (ref 15–41)
Albumin: 4 g/dL (ref 3.5–5.0)
Alkaline Phosphatase: 43 U/L (ref 38–126)
Anion gap: 11 (ref 5–15)
BUN: 14 mg/dL (ref 6–20)
CO2: 29 mmol/L (ref 22–32)
Calcium: 9.5 mg/dL (ref 8.9–10.3)
Chloride: 103 mmol/L (ref 98–111)
Creatinine, Ser: 0.99 mg/dL (ref 0.61–1.24)
GFR, Estimated: >60 mL/min (ref >60)
Glucose, Bld: 134 mg/dL — ABNORMAL HIGH (ref 70–99)
Potassium: 3.9 mmol/L (ref 3.5–5.1)
Sodium: 143 mmol/L (ref 135–145)
Total Bilirubin: 0.6 mg/dL (ref 0.3–1.2)
Total Protein: 6.5 g/dL (ref 6.5–8.1)

## 2020-04-19 LAB — TROPONIN I (HIGH SENSITIVITY)
Troponin I (High Sensitivity): 23 ng/L — ABNORMAL HIGH (ref <18)
Troponin I (High Sensitivity): 29 ng/L — ABNORMAL HIGH (ref <18)

## 2020-04-19 LAB — CBC WITH DIFFERENTIAL/PLATELET
Abs Immature Granulocytes: 0.06 10*3/uL (ref 0.00–0.07)
Basophils Absolute: 0.1 10*3/uL (ref 0.0–0.1)
Basophils Relative: 0 %
Eosinophils Absolute: 0.1 10*3/uL (ref 0.0–0.5)
Eosinophils Relative: 1 %
HCT: 44.8 % (ref 39.0–52.0)
Hemoglobin: 14.1 g/dL (ref 13.0–17.0)
Immature Granulocytes: 0 %
Lymphocytes Relative: 15 %
Lymphs Abs: 2.7 10*3/uL (ref 0.7–4.0)
MCH: 29.3 pg (ref 26.0–34.0)
MCHC: 31.5 g/dL (ref 30.0–36.0)
MCV: 93.1 fL (ref 80.0–100.0)
Monocytes Absolute: 0.9 10*3/uL (ref 0.1–1.0)
Monocytes Relative: 5 %
Neutro Abs: 14.9 10*3/uL — ABNORMAL HIGH (ref 1.7–7.7)
Neutrophils Relative %: 79 %
Platelets: 341 10*3/uL (ref 150–400)
RBC: 4.81 MIL/uL (ref 4.22–5.81)
RDW: 14.3 % (ref 11.5–15.5)
WBC: 18.8 10*3/uL — ABNORMAL HIGH (ref 4.0–10.5)
nRBC: 0 % (ref 0.0–0.2)

## 2020-04-19 LAB — I-STAT ARTERIAL BLOOD GAS, ED
Acid-Base Excess: 5 mmol/L — ABNORMAL HIGH (ref 0.0–2.0)
Bicarbonate: 30.7 mmol/L — ABNORMAL HIGH (ref 20.0–28.0)
Calcium, Ion: 1.19 mmol/L (ref 1.15–1.40)
HCT: 37 % — ABNORMAL LOW (ref 39.0–52.0)
Hemoglobin: 12.6 g/dL — ABNORMAL LOW (ref 13.0–17.0)
O2 Saturation: 99 %
Patient temperature: 98.6
Potassium: 4 mmol/L (ref 3.5–5.1)
Sodium: 140 mmol/L (ref 135–145)
TCO2: 32 mmol/L (ref 22–32)
pCO2 arterial: 47.8 mmHg (ref 32.0–48.0)
pH, Arterial: 7.415 (ref 7.350–7.450)
pO2, Arterial: 140 mmHg — ABNORMAL HIGH (ref 83.0–108.0)

## 2020-04-19 LAB — I-STAT CHEM 8, ED
BUN: 18 mg/dL (ref 6–20)
Calcium, Ion: 1.2 mmol/L (ref 1.15–1.40)
Chloride: 101 mmol/L (ref 98–111)
Creatinine, Ser: 0.9 mg/dL (ref 0.61–1.24)
Glucose, Bld: 131 mg/dL — ABNORMAL HIGH (ref 70–99)
HCT: 46 % (ref 39.0–52.0)
Hemoglobin: 15.6 g/dL (ref 13.0–17.0)
Potassium: 3.8 mmol/L (ref 3.5–5.1)
Sodium: 141 mmol/L (ref 135–145)
TCO2: 32 mmol/L (ref 22–32)

## 2020-04-19 LAB — PROCALCITONIN: Procalcitonin: 0.67 ng/mL

## 2020-04-19 LAB — RESP PANEL BY RT-PCR (FLU A&B, COVID) ARPGX2
Influenza A by PCR: NEGATIVE
Influenza B by PCR: NEGATIVE
SARS Coronavirus 2 by RT PCR: NEGATIVE

## 2020-04-19 LAB — URINALYSIS, ROUTINE W REFLEX MICROSCOPIC
Bilirubin Urine: NEGATIVE
Glucose, UA: 150 mg/dL — AB
Hgb urine dipstick: NEGATIVE
Ketones, ur: NEGATIVE mg/dL
Leukocytes,Ua: NEGATIVE
Nitrite: NEGATIVE
Protein, ur: NEGATIVE mg/dL
Specific Gravity, Urine: 1.006 (ref 1.005–1.030)
pH: 7 (ref 5.0–8.0)

## 2020-04-19 LAB — GLUCOSE, CAPILLARY
Glucose-Capillary: 286 mg/dL — ABNORMAL HIGH (ref 70–99)
Glucose-Capillary: 215 mg/dL — ABNORMAL HIGH (ref 70–99)

## 2020-04-19 LAB — LACTIC ACID, PLASMA
Lactic Acid, Venous: 2.6 mmol/L (ref 0.5–1.9)
Lactic Acid, Venous: 2.3 mmol/L (ref 0.5–1.9)
Lactic Acid, Venous: 3 mmol/L (ref 0.5–1.9)

## 2020-04-19 LAB — APTT: aPTT: 23 seconds — ABNORMAL LOW (ref 24–36)

## 2020-04-19 LAB — CBG MONITORING, ED: Glucose-Capillary: 151 mg/dL — ABNORMAL HIGH (ref 70–99)

## 2020-04-19 LAB — MRSA PCR SCREENING: MRSA by PCR: NEGATIVE

## 2020-04-19 LAB — PROTIME-INR
INR: 0.9 (ref 0.8–1.2)
Prothrombin Time: 12.1 seconds (ref 11.4–15.2)

## 2020-04-19 LAB — TSH: TSH: 0.325 u[IU]/mL — ABNORMAL LOW (ref 0.350–4.500)

## 2020-04-19 MED ORDER — METOPROLOL SUCCINATE ER 25 MG PO TB24
25.0000 mg | ORAL_TABLET | Freq: Every day | ORAL | Status: DC
  Administered 2020-04-19 – 2020-04-24 (×5): 25 mg via ORAL
  Filled 2020-04-19 (×5): qty 1

## 2020-04-19 MED ORDER — VANCOMYCIN HCL 2000 MG/400ML IV SOLN
2000.0000 mg | Freq: Once | INTRAVENOUS | Status: AC
  Administered 2020-04-19: 2000 mg via INTRAVENOUS
  Filled 2020-04-19: qty 400

## 2020-04-19 MED ORDER — MOMETASONE FURO-FORMOTEROL FUM 200-5 MCG/ACT IN AERO
2.0000 | INHALATION_SPRAY | Freq: Two times a day (BID) | RESPIRATORY_TRACT | Status: DC
  Administered 2020-04-19 – 2020-04-24 (×9): 2 via RESPIRATORY_TRACT
  Filled 2020-04-19 (×2): qty 8.8

## 2020-04-19 MED ORDER — SODIUM CHLORIDE 0.9 % IV SOLN: INTRAVENOUS | Status: DC

## 2020-04-19 MED ORDER — ALBUTEROL SULFATE (2.5 MG/3ML) 0.083% IN NEBU: 2.5000 mg | INHALATION_SOLUTION | Freq: Four times a day (QID) | RESPIRATORY_TRACT | Status: DC | PRN

## 2020-04-19 MED ORDER — ONDANSETRON HCL 4 MG PO TABS: 4.0000 mg | ORAL_TABLET | Freq: Four times a day (QID) | ORAL | Status: DC | PRN

## 2020-04-19 MED ORDER — ONDANSETRON HCL 4 MG/2ML IJ SOLN: 4.0000 mg | Freq: Four times a day (QID) | INTRAMUSCULAR | Status: DC | PRN

## 2020-04-19 MED ORDER — LOSARTAN POTASSIUM 50 MG PO TABS
100.0000 mg | ORAL_TABLET | Freq: Every day | ORAL | Status: DC
  Administered 2020-04-19 – 2020-04-24 (×5): 100 mg via ORAL
  Filled 2020-04-19 (×5): qty 2

## 2020-04-19 MED ORDER — DM-GUAIFENESIN ER 30-600 MG PO TB12
1.0000 | ORAL_TABLET | Freq: Two times a day (BID) | ORAL | Status: DC
  Administered 2020-04-19 – 2020-04-24 (×9): 1 via ORAL
  Filled 2020-04-19 (×13): qty 1

## 2020-04-19 MED ORDER — LACTATED RINGERS IV SOLN: INTRAVENOUS | Status: DC

## 2020-04-19 MED ORDER — ACETAMINOPHEN 325 MG PO TABS
650.0000 mg | ORAL_TABLET | Freq: Four times a day (QID) | ORAL | Status: DC | PRN
  Administered 2020-04-22 – 2020-04-23 (×2): 650 mg via ORAL
  Filled 2020-04-19 (×2): qty 2

## 2020-04-19 MED ORDER — BISACODYL 10 MG RE SUPP: 10.0000 mg | Freq: Every day | RECTAL | Status: DC | PRN

## 2020-04-19 MED ORDER — SODIUM CHLORIDE 0.9 % IV SOLN
2.0000 g | INTRAVENOUS | Status: DC
  Administered 2020-04-19: 2 g via INTRAVENOUS
  Filled 2020-04-19: qty 20

## 2020-04-19 MED ORDER — ACETAMINOPHEN 650 MG RE SUPP: 650.0000 mg | Freq: Four times a day (QID) | RECTAL | Status: DC | PRN

## 2020-04-19 MED ORDER — VANCOMYCIN HCL 1750 MG/350ML IV SOLN
1750.0000 mg | INTRAVENOUS | Status: DC
  Administered 2020-04-20: 1750 mg via INTRAVENOUS
  Filled 2020-04-19 (×2): qty 350

## 2020-04-19 MED ORDER — UMECLIDINIUM BROMIDE 62.5 MCG/INH IN AEPB
1.0000 | INHALATION_SPRAY | Freq: Every day | RESPIRATORY_TRACT | Status: DC
  Administered 2020-04-20 – 2020-04-24 (×5): 1 via RESPIRATORY_TRACT
  Filled 2020-04-19 (×2): qty 7

## 2020-04-19 MED ORDER — IPRATROPIUM-ALBUTEROL 0.5-2.5 (3) MG/3ML IN SOLN
3.0000 mL | Freq: Three times a day (TID) | RESPIRATORY_TRACT | Status: DC
  Administered 2020-04-20: 3 mL via RESPIRATORY_TRACT
  Filled 2020-04-19: qty 3

## 2020-04-19 MED ORDER — ENOXAPARIN SODIUM 40 MG/0.4ML ~~LOC~~ SOLN
40.0000 mg | SUBCUTANEOUS | Status: DC
  Administered 2020-04-19 – 2020-04-24 (×6): 40 mg via SUBCUTANEOUS
  Filled 2020-04-19 (×6): qty 0.4

## 2020-04-19 MED ORDER — METHYLPREDNISOLONE SODIUM SUCC 125 MG IJ SOLR
60.0000 mg | Freq: Three times a day (TID) | INTRAMUSCULAR | Status: DC
  Administered 2020-04-19 – 2020-04-20 (×5): 60 mg via INTRAVENOUS
  Filled 2020-04-19 (×5): qty 2

## 2020-04-19 MED ORDER — SODIUM CHLORIDE 0.9 % IV SOLN
2.0000 g | Freq: Once | INTRAVENOUS | Status: AC
  Administered 2020-04-19: 2 g via INTRAVENOUS

## 2020-04-19 MED ORDER — SODIUM CHLORIDE 0.9 % IV SOLN
2.0000 g | Freq: Three times a day (TID) | INTRAVENOUS | Status: AC
  Administered 2020-04-19 – 2020-04-24 (×15): 2 g via INTRAVENOUS
  Filled 2020-04-19 (×16): qty 2

## 2020-04-19 MED ORDER — VANCOMYCIN HCL 1750 MG/350ML IV SOLN
1750.0000 mg | INTRAVENOUS | Status: DC
  Filled 2020-04-19: qty 350

## 2020-04-19 MED ORDER — IPRATROPIUM-ALBUTEROL 0.5-2.5 (3) MG/3ML IN SOLN
3.0000 mL | Freq: Four times a day (QID) | RESPIRATORY_TRACT | Status: DC
  Administered 2020-04-19 (×2): 3 mL via RESPIRATORY_TRACT
  Filled 2020-04-19 (×2): qty 3

## 2020-04-19 MED ORDER — INSULIN ASPART 100 UNIT/ML ~~LOC~~ SOLN
0.0000 [IU] | Freq: Three times a day (TID) | SUBCUTANEOUS | Status: DC
  Administered 2020-04-19: 5 [IU] via SUBCUTANEOUS
  Administered 2020-04-20 (×3): 3 [IU] via SUBCUTANEOUS
  Administered 2020-04-21 (×3): 2 [IU] via SUBCUTANEOUS
  Administered 2020-04-22: 1 [IU] via SUBCUTANEOUS
  Administered 2020-04-22 (×2): 3 [IU] via SUBCUTANEOUS
  Administered 2020-04-23 (×2): 2 [IU] via SUBCUTANEOUS
  Administered 2020-04-24 (×2): 3 [IU] via SUBCUTANEOUS

## 2020-04-19 MED ORDER — LACTATED RINGERS IV BOLUS
30.0000 mL/kg | Freq: Once | INTRAVENOUS | Status: AC
  Administered 2020-04-19: 2730 mL via INTRAVENOUS

## 2020-04-19 MED ORDER — SODIUM CHLORIDE 0.9 % IV SOLN
2.0000 g | Freq: Once | INTRAVENOUS | Status: DC
  Filled 2020-04-19: qty 2

## 2020-04-19 MED ORDER — INSULIN ASPART 100 UNIT/ML ~~LOC~~ SOLN: 0.0000 [IU] | Freq: Three times a day (TID) | SUBCUTANEOUS | Status: DC

## 2020-04-19 MED ORDER — ACETAMINOPHEN 500 MG PO TABS
1000.0000 mg | ORAL_TABLET | Freq: Once | ORAL | Status: AC
  Administered 2020-04-19: 1000 mg via ORAL
  Filled 2020-04-19: qty 2

## 2020-04-19 MED ORDER — SODIUM CHLORIDE 0.9 % IV SOLN
500.0000 mg | INTRAVENOUS | Status: DC
  Administered 2020-04-19: 500 mg via INTRAVENOUS
  Filled 2020-04-19 (×2): qty 500

## 2020-04-19 NOTE — ED Notes (Signed)
Cefepime and Vanc will be brought to RN form pharmacy

## 2020-04-19 NOTE — ED Notes (Signed)
Lactic 2.6 called from lab, MD notified

## 2020-04-19 NOTE — ED Provider Notes (Signed)
[facility] EMERGENCY DEPARTMENT Provider Note   CSN: [id number] Arrival date & time: 04/19/20  2774     History Chief Complaint  Patient presents with  . Respiratory Distress  . Shortness of Breath    James [name] is a 60 y.o. male presenting for evaluation of shortness of breath.  Level 5 caveat due to acuity of condition.  Patient states he is always short of breath and has difficulty breathing, but it worsened acutely today.  He is woken from sleep with severe shortness of breath.  He reports associated chest pain.  He denies known fever. No new cough.  He took several breathing treatments at home without improvement.  He has had breathing treatments with EMS without significant improvement.  He does wear oxygen at home, has been wearing it as prescribed.  He states he was recently treated for pneumonia, finished antibiotics last week.  He is not vaccinated for COVID.  No known history of CAD or CHF.  Additional history obtained from EMS.  On arrival, patient was 78% on room air with wheezing bilaterally.  Additional history attained from chart review.  Patient with a history of recurrent pneumonia, COPD, diabetes, hypertension  HPI     Past Medical History:  Diagnosis Date  . Acute on chronic respiratory failure with hypoxia ([place]) 06/08/2013  . CAP (community acquired pneumonia) 05/17/2018   See admit 05/17/18 ? rml  with   covid pcr neg - rx augmentin > f/u cxr in 4-6 weeks is fine unless condition declines   . Community acquired pneumonia of right lower lobe of lung 05/17/2018   See admit 05/17/18 ? rml  with   covid pcr neg - rx augmentin > f/u cxr in 4-6 weeks is fine unless condition declines   . COPD (chronic obstructive pulmonary disease) ([place])    not on home  . Diabetes ([place])   . Dyspnea   . Hypertension   . Pneumonia 04/06/2016  . Pneumonia due to COVID-19 virus 10/19/2019  . Pneumothorax    2016, fell from ladder  . 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 Tick bite 06/03/2018    Patient Active Problem List   Diagnosis Date Noted  . COPD with acute exacerbation ([place]) 03/26/2020  . Pain of right forearm 12/03/2019  . Bronchiectasis ([place]) 09/24/2019  . Type 2 diabetes mellitus without complication, without long-term current use of insulin ([place]) 07/24/2019  . Other atopic dermatitis 05/01/2019  . Chronic respiratory failure with hypoxia ([place]) 04/23/2019  . Intellectual disability 04/23/2019  . History of alcohol use 11/23/2018  . Hypertension 05/17/2016  . History of tobacco abuse 04/06/2016  . COPD mixed type ([place]) 03/26/2016    Past Surgical History:  Procedure Laterality Date  . VIDEO ASSISTED THORACOSCOPY (VATS)/THOROCOTOMY Left 06/11/2013   Procedure: VIDEO ASSISTED THORACOSCOPY (VATS)/THOROCOTOMY;  Surgeon: Ivin Poot, MD;  Location: [facility];  Service: Thoracic;  Laterality: Left;  [name] VIDEO BRONCHOSCOPY N/A 06/11/2013   Procedure: VIDEO BRONCHOSCOPY;  Surgeon: Ivin Poot, MD;  Location: [facility];  Service: Thoracic;  Laterality: N/A;  . VIDEO BRONCHOSCOPY N/A 08/10/2018   Procedure: VIDEO BRONCHOSCOPY WITH FLUORO;  Surgeon: Candee Furbish, MD;  Location: [facility] ENDOSCOPY;  Service: Endoscopy;  Laterality: N/A;       Family History  Problem Relation Age of Onset  . Heart disease Father   . COPD Sister     Social History   Tobacco Use  . Smoking status: Former Smoker  Packs/day: 1.50    Years: 40.00    Pack years: 60.00    Types: Cigarettes    Quit date: 2014    Years since quitting: 8.2  . Smokeless tobacco: Current User    Types: Chew  Vaping Use  . Vaping Use: Never used  Substance Use Topics  . Alcohol use: Yes    Alcohol/week: 28.0 standard drinks    Types: 28 Cans of beer per week    Comment: 4 beers a night; + jitters with not drinking, denies DTs or seizures  . Drug use: Yes    Types: Marijuana    Comment: daily; quit using crack and methamphetamine about 5 months ago     Home  Medications Prior to Admission medications   Medication Sig Start Date End Date Taking? Authorizing Provider  Accu-Chek Softclix Lancets lancets Use as instructed to check blood sugar once daily. 08/15/19  Yes [name], MD  albuterol (PROVENTIL) (2.5 MG/3ML) 0.083% nebulizer solution Take 3 mLs (2.5 mg total) by nebulization every 6 (six) hours as needed for wheezing or shortness of breath. 03/28/20  Yes [name], MD  albuterol (VENTOLIN HFA) 108 (90 Base) MCG/ACT inhaler Inhale 2 puffs into the lungs every 4 (four) hours as needed for wheezing or shortness of breath. 11/16/19  Yes Ward, Delice Bison, DO  Blood Glucose Monitoring Suppl (ACCU-CHEK GUIDE ME) w/Device KIT 1 each by Other route in the morning and at bedtime. 08/15/19  Yes [provider]  budesonide-formoterol (SYMBICORT) 160-4.5 MCG/ACT inhaler INHALE 2 PUFFS INTO THE LUNGS 2 (TWO) TIMES DAILY. 04/17/20 04/17/21 Yes [name], MD  furosemide (LASIX) 20 MG tablet TAKE 1 TABLET (20 MG TOTAL) BY MOUTH DAILY AS NEEDED FOR EDEMA (LOWER EXTREMITY). Patient taking differently: Take 20 mg by mouth daily as needed for edema. 04/14/20 04/14/21 Yes [name], MD  glucose blood (TRUE METRIX BLOOD GLUCOSE TEST) test strip Use as instructed 08/14/19  Yes [name], MD  losartan (COZAAR) 100 MG tablet TAKE 1 TABLET (100 MG TOTAL) BY MOUTH DAILY. Patient taking differently: Take 100 mg by mouth daily. 04/17/20 04/17/21 Yes [name], MD  metFORMIN (GLUCOPHAGE) 1000 MG tablet Take 1 tablet (1,000 mg total) by mouth 2 (two) times daily with a meal. 04/17/20 04/17/21 Yes [name], MD  metoprolol succinate (TOPROL-XL) 25 MG 24 hr tablet TAKE 1 TABLET (25 MG TOTAL) BY MOUTH DAILY. Patient taking differently: Take 25 mg by mouth daily. 04/17/20 04/17/21 Yes [name], MD  pantoprazole (PROTONIX) 40 MG tablet Take 1 tablet (40 mg total) by mouth daily. 04/17/20  Yes [name], MD  predniSONE  (DELTASONE) 10 MG tablet Take 4 tablets daily for 5 days then two daily and stay Patient taking differently: Take 20 mg by mouth daily with breakfast. 04/17/20  Yes [name], MD  Respiratory Therapy Supplies (FLUTTER) DEVI Use 4 times daily Patient taking differently: 1 each by Other route See admin instructions. Use 4 times daily 04/27/18  Yes [name], MD  Tiotropium Bromide Monohydrate 2.5 MCG/ACT AERS INHALE 2 PUFFS INTO THE LUNGS DAILY. 04/17/20 04/17/21 Yes [name], MD  guaiFENesin (MUCINEX) 600 MG 12 hr tablet TAKE 1 TABLET (600 MG TOTAL) BY MOUTH 2 (TWO) TIMES DAILY. Patient not taking: No sig reported 03/28/20 03/28/21  [name], MD  ketoconazole (NIZORAL) 2 % cream 1 (ONE) APPLICATION TO AFFECTED AREA TWICE A DAY Patient not taking: No sig reported 02/29/20 02/28/21  [name], MD  Multiple Vitamin (MULTIVITAMIN WITH MINERALS) TABS tablet Take 1 tablet by mouth daily. Patient not taking: No sig reported 03/29/20   [name], MD    Allergies    Gabapentin  Review of Systems   Review of Systems  Unable to perform ROS: Acuity of condition  Respiratory: Positive for shortness of breath.     Physical Exam Updated Vital Signs BP 119/67   Pulse (!) 117   Temp (!) 102.6 F (39.2 C) (Rectal)   Resp (!) 26   Ht _0  (1.727 m)   Wt 91 kg   SpO2 99%   BMI 30.50 kg/m   Physical Exam Vitals and nursing note reviewed.  Constitutional:      General: He is in acute distress.     Appearance: He is well-developed. He is ill-appearing and toxic-appearing.  HENT:     Head: Normocephalic and atraumatic.  Eyes:     Extraocular Movements: Extraocular movements intact.     Conjunctiva/sclera: Conjunctivae normal.     Pupils: Pupils are equal, round, and reactive to light.  Cardiovascular:     Rate and Rhythm: Regular rhythm. Tachycardia present.     Pulses: Normal pulses.  Pulmonary:     Effort: Tachypnea, accessory muscle usage and respiratory  distress present.     Breath sounds: Decreased breath sounds and wheezing present.     Comments: In acute respiratory distress with tachypnea and diminished lung sounds with scattered wheezing Abdominal:     General: There is no distension.     Palpations: Abdomen is soft. There is no mass.     Tenderness: There is no abdominal tenderness. There is no guarding or rebound.  Musculoskeletal:        General: Normal range of motion.     Cervical back: Normal range of motion and neck supple.     Right lower leg: No edema.     Left lower leg: No edema.  Skin:    General: Skin is warm and dry.     Capillary Refill: Capillary refill takes less than 2 seconds.  Neurological:     Mental Status: He is alert and oriented to person, place, and time.     ED Results / Procedures / Treatments   Labs (all labs ordered are listed, but only abnormal results are displayed) Labs Reviewed  CBC WITH DIFFERENTIAL/PLATELET - Abnormal; Notable for the following components:      Result Value   WBC 18.8 (*)    Neutro Abs 14.9 (*)    All other components within normal limits  COMPREHENSIVE METABOLIC PANEL - Abnormal; Notable for the following components:   Glucose, Bld 134 (*)    All other components within normal limits  LACTIC ACID, PLASMA - Abnormal; Notable for the following components:   Lactic Acid, Venous 2.6 (*)    All other components within normal limits  APTT - Abnormal; Notable for the following components:   aPTT 23 (*)    All other components within normal limits  I-STAT CHEM 8, ED - Abnormal; Notable for the following components:   Glucose, Bld 131 (*)    All other components within normal limits  CBG MONITORING, ED - Abnormal; Notable for the following components:   Glucose-Capillary 151 (*)    All other components within normal limits  TROPONIN I (HIGH SENSITIVITY) - Abnormal; Notable for the following components:   Troponin I (High Sensitivity) 23 (*)    All other components within  normal limits  RESP PANEL BY RT-PCR (FLU A&B, COVID) ARPGX2  CULTURE, BLOOD (ROUTINE X 2)  CULTURE, BLOOD (ROUTINE X 2)  URINE CULTURE  PROTIME-INR  LACTIC ACID, PLASMA  URINALYSIS, ROUTINE W REFLEX MICROSCOPIC  I-STAT ARTERIAL BLOOD GAS, ED  TROPONIN I (HIGH SENSITIVITY)    EKG None  Radiology DG Chest Portable 1 View  Result Date: 04/19/2020 CLINICAL DATA:  Shortness of breath, respiratory distress. EXAM: PORTABLE CHEST 1 VIEW COMPARISON:  Chest x-rays dated 03/06/2020 and 11/16/2019. FINDINGS: New dense opacity at the RIGHT lung base, suspected pneumonia. No pleural effusion or pneumothorax is seen. Multiple old healed rib fractures bilaterally. No acute appearing osseous abnormality. Heart size and mediastinal contours are stable given the semi-erect patient positioning. IMPRESSION: New dense opacity at the RIGHT lung base, suspected pneumonia. Consider chest CT for confirmation and to exclude the alternative possibility of malignancy. Electronically Signed   By: Franki Cabot M.D.   On: 04/19/2020 07:46    Procedures .Critical Care Performed by: Franchot Heidelberg, PA-C Authorized by: Franchot Heidelberg, PA-C   Critical care provider statement:    Critical care time (minutes):  50   Critical care time was exclusive of:  Separately billable procedures and treating other patients and teaching time   Critical care was necessary to treat or prevent imminent or life-threatening deterioration of the following conditions:  Respiratory failure and sepsis   Critical care was time spent personally by me on the following activities:  Blood draw for specimens, development of treatment plan with patient or surrogate, evaluation of patient's response to treatment, examination of patient, obtaining history from patient or surrogate, ordering and performing treatments and interventions, ordering and review of laboratory studies, ordering and review of radiographic studies, pulse oximetry,  re-evaluation of patient's condition and review of old charts   I assumed direction of critical care for this patient from another provider in my specialty: no     Care discussed with: admitting provider   Comments:     Patient presenting in acute respiratory distress.  Meet SIRS criteria, concern for severe sepsis.  BiPAP started, patient started on IV antibiotics with plan for admission     Medications Ordered in ED Medications  lactated ringers infusion ( Intravenous New Bag/Given 04/19/20 0748)  cefTRIAXone (ROCEPHIN) 2 g in sodium chloride 0.9 % 100 mL IVPB (0 g Intravenous Stopped 04/19/20 0827)  azithromycin (ZITHROMAX) 500 mg in sodium chloride 0.9 % 250 mL IVPB (500 mg Intravenous New Bag/Given 04/19/20 0829)  ceFEPIme (MAXIPIME) 1 g in sodium chloride 0.9 % 100 mL IVPB (has no administration in time range)  acetaminophen (TYLENOL) tablet 1,000 mg (1,000 mg Oral Given 04/19/20 0749)  lactated ringers bolus 2,730 mL (0 mL/kg  91 kg Intravenous Stopped 04/19/20 0831)    ED Course  I have reviewed the triage vital signs and the nursing notes.  Pertinent labs & imaging results that were available during my care of the patient were reviewed by me and considered in my medical decision making (see chart for details).    MDM Rules/Calculators/A&P                          Patient presenting for evaluation of worsening shortness of breath.  On exam, patient appears ill and in acute distress.  He is in respiratory distress.  Increased work of breathing, tachypnea, tachycardia.  Additionally, patient is febrile, as such, concern for infectious etiology on top of patient's underlying COPD.  Consider CHF, though less likely without signs of peripheral edema.  BiPAP started as patient has significant work of breathing on nonrebreather.  Will obtain labs, EKG, chest x-ray.  In the setting of fever and tachycardia and tachypnea, will call code sepsis and start antibiotics.  Chest x-ray viewed  interpreted by me, shows opacity in the right lower lobe.  In the setting of fever, concern for infection.  Per radiology, cannot rule out malignancy, however in this clinical setting I suspect infection.  Labs show leukocytosis of 18, this may be secondary to prednisone burst infection.  Troponin minimally elevated at 23, likely secondary to demand.  EKG is nonischemic.  Electrolytes otherwise stable.  On repeat evaluation, patient is resting much more comfortably on BiPAP.  Sats remained reassuring and work of breathing is much improved.  However he does continue to remain tachycardic around 120.  As such, we will keep him on BiPAP.  In the setting of severe sepsis, 30 cc/kg fluid started.   Discussed with Dr. Nicoletta Dress from [facility] hospitalist service, patient to be admitted.  Requesting additional antibiotics of cefepime and vanc, they were ordered and I discussed with pharmacy.  Final Clinical Impression(s) / ED Diagnoses Final diagnoses:  Severe sepsis with acute organ dysfunction ([place])  Pneumonia of right lower lobe due to infectious organism    Rx / DC Orders ED Discharge Orders    None       Franchot Heidelberg, PA-C 04/19/20 8022    Merryl Hacker, MD 04/20/20 [phone]

## 2020-04-19 NOTE — ED Notes (Signed)
Provider at bedside. RT at bedside.

## 2020-04-19 NOTE — ED Notes (Signed)
Patient using urinal. Given water with pills, swallowing without difficulty. Fever down, states he feels better. Maintained 02 sats 96% off of BIPAP for 4 minutes.

## 2020-04-19 NOTE — ED Notes (Signed)
RT at bedside.

## 2020-04-19 NOTE — Plan of Care (Signed)
Received patient from ED on 4L [place] stable oriented to unit and call bell,

## 2020-04-19 NOTE — Progress Notes (Addendum)
Pt currently off BIPAP on 4L [place] and is doing well at this time. WOB WNL. Pts O2 decreased to 2L per home regimen. RT to continue to monitor.

## 2020-04-19 NOTE — ED Triage Notes (Signed)
BIB [facility] home. Hx: COPD. When EMS arrived the pt was 78% on room air. Wheezing billatertally. Came up to 96% on neb. 2 DuoNebs given, 1 Albuterol neb. 125 Solu-medrol. 2g Mag. All given by EMS.   178/100 140 Heart rate 96% on Neb.

## 2020-04-19 NOTE — H&P (Signed)
History and Physical    MIKKEL CHARRETTE YOV:[id number] DOB: [birth year] DOA: 04/19/2020  PCP: [name], MD Patient coming from: Home  Chief Complaint: SOB/DOE, cough and wheezing  HPI: LANGSTON TUBERVILLE is a 60 y.o. male with medical history significant of recurrent pneumonia, COPD O2 and prednisone dependent, chronic respiratory failure on home O2 2-4 LNC, COVID-19 infection in 10/2019, HTN, T2DM, who presented with SOB/DOE, cough and wheezing.   Patient has a history of recurrent pneumonia.  He was recently hospitalized in March 2022 for acute on chronic hypoxic respiratory failure secondary to COPD exacerbation and probable community-acquired bacterial pneumonia. He was discharged on prednisone taper and currently taking prednisone 10 mg BID. He follows up with pulmonologist and last office visit was on 04/14/2020. Patient is not vaccinated against COVID-19 and influenza.  Patient reports that he has had progressively worsening SOB/DOE, productive cough with dark sputum and wheezing over the last 2-3 days.  Associated symptoms included chills and feeling ill.  Denies fevers, chest pain, nausea, vomiting, diarrhea, abdominal pain, dysuria or urinary frequency and urgency.  He was reported to have low O2 sats 78% on room air per EMS.  He received Solumedrol 125 mg IV, Mg 2g IV and Neb per EMS enroute to the ED. Patient was brought to ED for further evaluation.  In the emergency room, T-max 102.62F, pulse 146, RR 30s, BP 158/112.  Labs showed WBC 18.8, lactic acid 2.6, nonrevealing CMP. CXR suggested RLL new dense opacities.  Patient was placed on BiPAP due to increased WOB.  He received IV fluid bolus 30 mL/kg, and antibiotics.    Review of Systems: As per HPI otherwise 10 point review of systems negative.  Review of Systems Otherwise negative except as per HPI, including: General: Positive for fevers and chills.  Negative for night sweats or unintended weight loss. Resp: Positive for cough,  wheezing, shortness of breath. Cardiac: Denies chest pain, palpitations, orthopnea, paroxysmal nocturnal dyspnea. GI: Denies abdominal pain, nausea, vomiting, diarrhea or constipation GU: Denies dysuria, frequency, hesitancy or incontinence MS: Denies muscle aches, joint pain or swelling Neuro: Denies headache, neurologic deficits (focal weakness, numbness, tingling), abnormal gait Psych: Denies anxiety, depression, SI/HI/AVH Skin: Denies new rashes or lesions ID: Denies sick contacts, exotic exposures, travel  Past Medical History:  Diagnosis Date  . Acute on chronic respiratory failure with hypoxia ([place]) 06/08/2013  . CAP (community acquired pneumonia) 05/17/2018   See admit 05/17/18 ? rml  with   covid pcr neg - rx augmentin > f/u cxr in 4-6 weeks is fine unless condition declines   . Community acquired pneumonia of right lower lobe of lung 05/17/2018   See admit 05/17/18 ? rml  with   covid pcr neg - rx augmentin > f/u cxr in 4-6 weeks is fine unless condition declines   . COPD (chronic obstructive pulmonary disease) ([place])    not on home  . Diabetes ([place])   . Dyspnea   . Hypertension   . Pneumonia 04/06/2016  . Pneumonia due to COVID-19 virus 10/19/2019  . Pneumothorax    2016, fell from ladder  . 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 Tick bite 06/03/2018    Past Surgical History:  Procedure Laterality Date  . VIDEO ASSISTED THORACOSCOPY (VATS)/THOROCOTOMY Left 06/11/2013   Procedure: VIDEO ASSISTED THORACOSCOPY (VATS)/THOROCOTOMY;  Surgeon: Ivin Poot, MD;  Location: [place];  Service: Thoracic;  Laterality: Left;  [name] VIDEO BRONCHOSCOPY N/A 06/11/2013   Procedure: VIDEO BRONCHOSCOPY;  Surgeon: Tharon Aquas Trigt,  MD;  Location: [place];  Service: Thoracic;  Laterality: N/A;  . VIDEO BRONCHOSCOPY N/A 08/10/2018   Procedure: VIDEO BRONCHOSCOPY WITH FLUORO;  Surgeon: Candee Furbish, MD;  Location: [facility] ENDOSCOPY;  Service: Endoscopy;  Laterality: N/A;    SOCIAL HISTORY:  reports that he  quit smoking about 8 years ago. His smoking use included cigarettes. He has a 60.00 pack-year smoking history. His smokeless tobacco use includes chew. He reports current alcohol use of about 28.0 standard drinks of alcohol per week. He reports current drug use. Drug: Marijuana.  Allergies  Allergen Reactions  . Gabapentin Other (See Comments)    hallucinations    FAMILY HISTORY: Family History  Problem Relation Age of Onset  . Heart disease Father   . COPD Sister      Prior to Admission medications   Medication Sig Start Date End Date Taking? Authorizing Provider  Accu-Chek Softclix Lancets lancets Use as instructed to check blood sugar once daily. 08/15/19  Yes [name], MD  albuterol (PROVENTIL) (2.5 MG/3ML) 0.083% nebulizer solution Take 3 mLs (2.5 mg total) by nebulization every 6 (six) hours as needed for wheezing or shortness of breath. 03/28/20  Yes [name], MD  albuterol (VENTOLIN HFA) 108 (90 Base) MCG/ACT inhaler Inhale 2 puffs into the lungs every 4 (four) hours as needed for wheezing or shortness of breath. 11/16/19  Yes Ward, Delice Bison, DO  Blood Glucose Monitoring Suppl (ACCU-CHEK GUIDE ME) w/Device KIT 1 each by Other route in the morning and at bedtime. 08/15/19  Yes [provider]  budesonide-formoterol (SYMBICORT) 160-4.5 MCG/ACT inhaler INHALE 2 PUFFS INTO THE LUNGS 2 (TWO) TIMES DAILY. 04/17/20 04/17/21 Yes [name], MD  furosemide (LASIX) 20 MG tablet TAKE 1 TABLET (20 MG TOTAL) BY MOUTH DAILY AS NEEDED FOR EDEMA (LOWER EXTREMITY). Patient taking differently: Take 20 mg by mouth daily as needed for edema. 04/14/20 04/14/21 Yes [name], MD  glucose blood (TRUE METRIX BLOOD GLUCOSE TEST) test strip Use as instructed 08/14/19  Yes [name], MD  losartan (COZAAR) 100 MG tablet TAKE 1 TABLET (100 MG TOTAL) BY MOUTH DAILY. Patient taking differently: Take 100 mg by mouth daily. 04/17/20 04/17/21 Yes [name], MD   metFORMIN (GLUCOPHAGE) 1000 MG tablet Take 1 tablet (1,000 mg total) by mouth 2 (two) times daily with a meal. 04/17/20 04/17/21 Yes [name], MD  metoprolol succinate (TOPROL-XL) 25 MG 24 hr tablet TAKE 1 TABLET (25 MG TOTAL) BY MOUTH DAILY. Patient taking differently: Take 25 mg by mouth daily. 04/17/20 04/17/21 Yes [name], MD  pantoprazole (PROTONIX) 40 MG tablet Take 1 tablet (40 mg total) by mouth daily. 04/17/20  Yes [name], MD  predniSONE (DELTASONE) 10 MG tablet Take 4 tablets daily for 5 days then two daily and stay Patient taking differently: Take 20 mg by mouth daily with breakfast. 04/17/20  Yes [name], MD  Respiratory Therapy Supplies (FLUTTER) DEVI Use 4 times daily Patient taking differently: 1 each by Other route See admin instructions. Use 4 times daily 04/27/18  Yes [name], MD  Tiotropium Bromide Monohydrate 2.5 MCG/ACT AERS INHALE 2 PUFFS INTO THE LUNGS DAILY. 04/17/20 04/17/21 Yes [name], MD  guaiFENesin (MUCINEX) 600 MG 12 hr tablet TAKE 1 TABLET (600 MG TOTAL) BY MOUTH 2 (TWO) TIMES DAILY. Patient not taking: No sig reported 03/28/20 03/28/21  [name], MD  ketoconazole (NIZORAL) 2 % cream 1 (ONE) APPLICATION TO  AFFECTED AREA TWICE A DAY Patient not taking: No sig reported 02/29/20 02/28/21  [name], MD  Multiple Vitamin (MULTIVITAMIN WITH MINERALS) TABS tablet Take 1 tablet by mouth daily. Patient not taking: No sig reported 03/29/20   [name], MD    Physical Exam: Vitals:   04/19/20 0824 04/19/20 0830 04/19/20 0930 04/19/20 1000  BP: 96/68 119/67 (!) 114/59 112/63  Pulse: (!) 125 (!) 117 (!) 109 (!) 106  Resp: (!) 28 (!) 26 (!) 25 (!) 23  Temp:      TempSrc:      SpO2: 99% 99% 99% 100%  Weight:      Height:          Constitutional: NAD, calm, acutely ill-appearing.  On BiPAP. Eyes: PERRL, lids and conjunctivae normal ENMT: Mucous membranes are moist. Posterior pharynx clear of any exudate  or lesions.Normal dentition.  Neck: normal, supple, no masses, no thyromegaly Respiratory: Tachypnea.  On BiPAP.  Bilateral lungs sound diminished. RLL rhonchi, wheezing and rales noted.  Normal respiratory effort. No accessory muscle use.  Cardiovascular: Tachycardia.  Regular rate and rhythm, no murmurs / rubs / gallops. No extremity edema. 2+ pedal pulses. No carotid bruits.  Abdomen: no tenderness, no masses palpated. No hepatosplenomegaly. Bowel sounds positive.  Musculoskeletal: no clubbing / cyanosis. No joint deformity upper and lower extremities. Good ROM, no contractures. Normal muscle tone.  Skin: no rashes, lesions, ulcers. No induration Neurologic: CN 2-12 grossly intact. Sensation intact, DTR normal. Strength 5/5 in all 4.  Psychiatric: Normal judgment and insight. Alert and oriented x 3. Normal mood.     Labs on Admission: I have personally reviewed following labs and imaging studies  CBC: Recent Labs  Lab 04/19/20 0705 04/19/20 0710 04/19/20 0904  WBC 18.8*  --   --   NEUTROABS 14.9*  --   --   HGB 14.1 15.6 12.6*  HCT 44.8 46.0 37.0*  MCV 93.1  --   --   PLT 341  --   --    Basic Metabolic Panel: Recent Labs  Lab 04/19/20 0705 04/19/20 0710 04/19/20 0904  Luismiguel Lamere 143 141 140  K 3.9 3.8 4.0  CL 103 101  --   CO2 29  --   --   GLUCOSE 134* 131*  --   BUN 14 18  --   CREATININE 0.99 0.90  --   CALCIUM 9.5  --   --    GFR: Estimated Creatinine Clearance: 97.9 mL/min (by C-G formula based on SCr of 0.9 mg/dL). Liver Function Tests: Recent Labs  Lab 04/19/20 0705  AST 31  ALT 29  ALKPHOS 43  BILITOT 0.6  PROT 6.5  ALBUMIN 4.0   No results for input(s): LIPASE, AMYLASE in the last 168 hours. No results for input(s): AMMONIA in the last 168 hours. Coagulation Profile: Recent Labs  Lab 04/19/20 0705  INR 0.9   Cardiac Enzymes: No results for input(s): CKTOTAL, CKMB, CKMBINDEX, TROPONINI in the last 168 hours. BNP (last 3 results) No results for  input(s): PROBNP in the last 8760 hours. HbA1C: No results for input(s): HGBA1C in the last 72 hours. CBG: Recent Labs  Lab 04/19/20 0756  GLUCAP 151*   Lipid Profile: No results for input(s): CHOL, HDL, LDLCALC, TRIG, CHOLHDL, LDLDIRECT in the last 72 hours. Thyroid Function Tests: No results for input(s): TSH, T4TOTAL, FREET4, T3FREE, THYROIDAB in the last 72 hours. Anemia Panel: No results for input(s): VITAMINB12, FOLATE, FERRITIN, TIBC, IRON, RETICCTPCT in the last  72 hours. Urine analysis:    Component Value Date/Time   COLORURINE YELLOW 07/04/2019 1220   APPEARANCEUR CLEAR 07/04/2019 1220   LABSPEC 1.021 07/04/2019 1220   PHURINE 5.0 07/04/2019 1220   GLUCOSEU >=500 (A) 07/04/2019 1220   HGBUR NEGATIVE 07/04/2019 1220   BILIRUBINUR NEGATIVE 07/04/2019 1220   BILIRUBINUR negative 09/25/2018 1432   KETONESUR NEGATIVE 07/04/2019 1220   PROTEINUR NEGATIVE 07/04/2019 1220   UROBILINOGEN 0.2 02/19/2019 1029   NITRITE NEGATIVE 07/04/2019 1220   LEUKOCYTESUR NEGATIVE 07/04/2019 1220   Sepsis Labs: !!!!!!!!!!!!!!!!!!!!!!!!!!!!!!!!!!!!!!!!!!!! _0 (procalcitonin:4,lacticidven:4) ) Recent Results (from the past 240 hour(s))  Resp Panel by RT-PCR (Flu A&B, Covid) Nasopharyngeal Swab     Status: None   Collection Time: 04/19/20  6:52 AM   Specimen: Nasopharyngeal Swab; Nasopharyngeal(NP) swabs in vial transport medium  Result Value Ref Range Status   SARS Coronavirus 2 by RT PCR NEGATIVE NEGATIVE Final    Comment: (NOTE) SARS-CoV-2 target nucleic acids are NOT DETECTED.  The SARS-CoV-2 RNA is generally detectable in upper respiratory specimens during the acute phase of infection. The lowest concentration of SARS-CoV-2 viral copies this assay can detect is 138 copies/mL. A negative result does not preclude SARS-Cov-2 infection and should not be used as the sole basis for treatment or other patient management decisions. A negative result may occur with  improper  specimen collection/handling, submission of specimen other than nasopharyngeal swab, presence of viral mutation(s) within the areas targeted by this assay, and inadequate number of viral copies(<138 copies/mL). A negative result must be combined with clinical observations, patient history, and epidemiological information. The expected result is Negative.  Fact Sheet for Patients:  EntrepreneurPulse.com.au  Fact Sheet for Healthcare Providers:  IncredibleEmployment.be  This test is no t yet approved or cleared by the [place] FDA and  has been authorized for detection and/or diagnosis of SARS-CoV-2 by FDA under an Emergency Use Authorization (EUA). This EUA will remain  in effect (meaning this test can be used) for the duration of the COVID-19 declaration under Section 564(b)(1) of the Act, 21 U.S.C.section 360bbb-3(b)(1), unless the authorization is terminated  or revoked sooner.       Influenza A by PCR NEGATIVE NEGATIVE Final   Influenza B by PCR NEGATIVE NEGATIVE Final    Comment: (NOTE) The Xpert Xpress SARS-CoV-2/FLU/RSV plus assay is intended as an aid in the diagnosis of influenza from Nasopharyngeal swab specimens and should not be used as a sole basis for treatment. Nasal washings and aspirates are unacceptable for Xpert Xpress SARS-CoV-2/FLU/RSV testing.  Fact Sheet for Patients: EntrepreneurPulse.com.au  Fact Sheet for Healthcare Providers: IncredibleEmployment.be  This test is not yet approved or cleared by the [place] FDA and has been authorized for detection and/or diagnosis of SARS-CoV-2 by FDA under an Emergency Use Authorization (EUA). This EUA will remain in effect (meaning this test can be used) for the duration of the COVID-19 declaration under Section 564(b)(1) of the Act, 21 U.S.C. section 360bbb-3(b)(1), unless the authorization is terminated or revoked.  Performed at  [facility], [place] [address] [place], [place] [address]      Radiological Exams on Admission: DG Chest Portable 1 View  Result Date: 04/19/2020 CLINICAL DATA:  Shortness of breath, respiratory distress. EXAM: PORTABLE CHEST 1 VIEW COMPARISON:  Chest x-rays dated 03/06/2020 and 11/16/2019. FINDINGS: New dense opacity at the RIGHT lung base, suspected pneumonia. No pleural effusion or pneumothorax is seen. Multiple old healed rib fractures bilaterally. No acute appearing osseous abnormality. Heart size and  mediastinal contours are stable given the semi-erect patient positioning. IMPRESSION: New dense opacity at the RIGHT lung base, suspected pneumonia. Consider chest CT for confirmation and to exclude the alternative possibility of malignancy. Electronically Signed   By: Franki Cabot M.D.   On: 04/19/2020 07:46     All images have been reviewed by me personally.  EKG: Independently reviewed.   Assessment/Plan Principal Problem:   Sepsis ([place]) Active Problems:   Hypertension   History of alcohol use   Chronic respiratory failure with hypoxia (HCC)   Type 2 diabetes mellitus without complication, without long-term current use of insulin (HCC)   COPD with acute exacerbation (HCC)   Lactic acid acidosis   HCAP (healthcare-associated pneumonia)   Obesity (BMI 30.0-34.9)   Assessment  plan  #Sepsis with lactic acidosis # RLL HCAP and COPD exacerbation #History of recurrent pneumonia #History of COPD on home O2 and prednisone #History of chronic respiratory failure on O2 2-4L [place] #Patient with COVID-19 in 10/2019 #Former smoker  Patient presented with fever T-max 102.8, SOB/DOE, cough and wheezing. He received Solumedrol 125 mg IV, Mg IV and neb per EMS enroute to the ED. WBC 18.8, lactic acid 2.6. CXR suggested RLL PNA, and suggested chest CT for confirmation and to exclude the alternative possibility of malignancy.[name] He requires BiPAP at the ED due to respiratory distress and  increased WOB. Given recent hospital admission for PNA, he is admitted to sepsis due to RLL HCAP and COPD exacerbation.   He is not vaccinated against COVID-19 and influenza.  - Admit to progressive unit - NIV BiPAP support as needed - ABG pending -Negative influenza -Negative COVID-19 -Blood cultures pending - Sepsis protocol- received IVF 68m/kg at the ED. MAP goal > 65 - Abx-vancomycin and cefepime - Tussins, nebs and steroids ( home prednisone 10 mg BID) -CXR suggested to r/o ,alignancy. I favor PNA given clinical manifestation. His previous chest CT reviewed. He has had recurrent PNA to RLL in the past and last chest CT was in July 2021. Will consider non urgent chest CT when his clinical status improves.    #T2DM  -Hemoglobin A1c 6.7 in March 2022 -Glucose before meals and at bedtime   #HTN  -Chronic and stable  #Obesity with BMI 30.5 Body mass index is 30.5 kg/m. -we will ask for PCP as outpatient                     DVT prophylaxis: lovenox Code Status: Full Family Communication: none at bedside Consults called: none  Admission status:   Status is: Inpatient  Remains inpatient appropriate because:Hemodynamically unstable   Dispo: The patient is from: Home              Anticipated d/c is to: Home              Patient currently is not medically stable to d/c.   Difficult to place patient No       Time Spent: 65 minutes.  >50% of the time was devoted to discussing the patients care, assessment, plan and disposition with other care givers along with counseling the patient about the risks and benefits of treatment.    NCharlann LangeMD [facility]  If 7PM-7AM, please contact night-coverage   04/19/2020, 10:25 AM

## 2020-04-19 NOTE — Progress Notes (Signed)
elink following sepis

## 2020-04-19 NOTE — Progress Notes (Addendum)
Pharmacy Antibiotic Note  James [name] is a 60 y.o. male admitted on 04/19/2020 with pneumonia.  Pharmacy has been consulted for Vancomycin dosing. Broadening to Cefepime as well. Patient received Ceftriaxone and Azithromycin this AM. WBC up at 18.8 and fever of 102.8. LA elevated at 2.6. SCr is 0.9. BMI 30.4  Plan: Vancomycin 2000 mg IV x1, then Vancomycin 1750 mg IV Q 24 hrs. Goal AUC 400-550. Expected AUC: 504 SCr used: 0.9  Cefepime dose x1 increased to 2g for sepsis.  F/up continuation of Cefepime dosing - currently only ordered as 1 time dose  Follow-up MRSA PCR to help narrow Follow-up culture results   Height: 5\' 8"  (172.7 cm) Weight: 91 kg (200 lb 9.9 oz) IBW/kg (Calculated) : 68.4  Temp (24hrs), Avg:102.7 F (39.3 C), Min:102.6 F (39.2 C), Max:102.8 F (39.3 C)  Recent Labs  Lab 04/19/20 0705 04/19/20 0710  WBC 18.8*  --   CREATININE 0.99 0.90  LATICACIDVEN 2.6*  --     Estimated Creatinine Clearance: 97.9 mL/min (by C-G formula based on SCr of 0.9 mg/dL).    Allergies  Allergen Reactions  . Gabapentin Other (See Comments)    hallucinations     Microbiology results: pending  Thank you for allowing pharmacy to be a part of this patient's care.  Sloan Leiter, PharmD, BCPS, BCCCP Clinical Pharmacist Please refer to [name] for [place] numbers 04/19/2020 8:53 AM

## 2020-04-20 ENCOUNTER — Inpatient Hospital Stay (HOSPITAL_COMMUNITY): Payer: Medicaid Other

## 2020-04-20 DIAGNOSIS — J9601 Acute respiratory failure with hypoxia: Secondary | ICD-10-CM

## 2020-04-20 DIAGNOSIS — R6520 Severe sepsis without septic shock: Secondary | ICD-10-CM

## 2020-04-20 LAB — CBC WITH DIFFERENTIAL/PLATELET
Abs Immature Granulocytes: 0.09 10*3/uL — ABNORMAL HIGH (ref 0.00–0.07)
Basophils Absolute: 0 10*3/uL (ref 0.0–0.1)
Basophils Relative: 0 %
Eosinophils Absolute: 0 10*3/uL (ref 0.0–0.5)
Eosinophils Relative: 0 %
HCT: 37.8 % — ABNORMAL LOW (ref 39.0–52.0)
Hemoglobin: 11.7 g/dL — ABNORMAL LOW (ref 13.0–17.0)
Immature Granulocytes: 1 %
Lymphocytes Relative: 3 %
Lymphs Abs: 0.5 10*3/uL — ABNORMAL LOW (ref 0.7–4.0)
MCH: 28.5 pg (ref 26.0–34.0)
MCHC: 31 g/dL (ref 30.0–36.0)
MCV: 92.2 fL (ref 80.0–100.0)
Monocytes Absolute: 0.4 10*3/uL (ref 0.1–1.0)
Monocytes Relative: 2 %
Neutro Abs: 16.2 10*3/uL — ABNORMAL HIGH (ref 1.7–7.7)
Neutrophils Relative %: 94 %
Platelets: 254 10*3/uL (ref 150–400)
RBC: 4.1 MIL/uL — ABNORMAL LOW (ref 4.22–5.81)
RDW: 14.2 % (ref 11.5–15.5)
WBC: 17.2 10*3/uL — ABNORMAL HIGH (ref 4.0–10.5)
nRBC: 0 % (ref 0.0–0.2)

## 2020-04-20 LAB — URINE CULTURE: Culture: NO GROWTH

## 2020-04-20 LAB — GLUCOSE, CAPILLARY
Glucose-Capillary: 221 mg/dL — ABNORMAL HIGH (ref 70–99)
Glucose-Capillary: 227 mg/dL — ABNORMAL HIGH (ref 70–99)
Glucose-Capillary: 245 mg/dL — ABNORMAL HIGH (ref 70–99)
Glucose-Capillary: 184 mg/dL — ABNORMAL HIGH (ref 70–99)

## 2020-04-20 LAB — BASIC METABOLIC PANEL
Anion gap: 5 (ref 5–15)
BUN: 16 mg/dL (ref 6–20)
CO2: 30 mmol/L (ref 22–32)
Calcium: 8.9 mg/dL (ref 8.9–10.3)
Chloride: 105 mmol/L (ref 98–111)
Creatinine, Ser: 0.77 mg/dL (ref 0.61–1.24)
GFR, Estimated: >60 mL/min (ref >60)
Glucose, Bld: 201 mg/dL — ABNORMAL HIGH (ref 70–99)
Potassium: 4.2 mmol/L (ref 3.5–5.1)
Sodium: 140 mmol/L (ref 135–145)

## 2020-04-20 LAB — PROTIME-INR
INR: 1 (ref 0.8–1.2)
Prothrombin Time: 12.9 seconds (ref 11.4–15.2)

## 2020-04-20 MED ORDER — IPRATROPIUM-ALBUTEROL 0.5-2.5 (3) MG/3ML IN SOLN
3.0000 mL | Freq: Four times a day (QID) | RESPIRATORY_TRACT | Status: DC | PRN
  Administered 2020-04-20: 3 mL via RESPIRATORY_TRACT
  Filled 2020-04-20: qty 3

## 2020-04-20 MED ORDER — LORAZEPAM 1 MG PO TABS
1.0000 mg | ORAL_TABLET | ORAL | Status: DC | PRN
  Administered 2020-04-20 (×2): 1 mg via ORAL
  Filled 2020-04-20 (×2): qty 1

## 2020-04-20 MED ORDER — ORAL CARE MOUTH RINSE
15.0000 mL | Freq: Two times a day (BID) | OROMUCOSAL | Status: DC
  Administered 2020-04-20 – 2020-04-24 (×8): 15 mL via OROMUCOSAL

## 2020-04-20 MED ORDER — NITROGLYCERIN 0.4 MG SL SUBL
SUBLINGUAL_TABLET | SUBLINGUAL | Status: AC
  Filled 2020-04-20: qty 3

## 2020-04-20 MED ORDER — THIAMINE HCL 100 MG PO TABS
100.0000 mg | ORAL_TABLET | Freq: Every day | ORAL | Status: DC
  Administered 2020-04-20: 100 mg via ORAL
  Filled 2020-04-20: qty 1

## 2020-04-20 MED ORDER — FOLIC ACID 1 MG PO TABS
1.0000 mg | ORAL_TABLET | Freq: Every day | ORAL | Status: DC
  Administered 2020-04-20: 1 mg via ORAL
  Filled 2020-04-20: qty 1

## 2020-04-20 MED ORDER — ADULT MULTIVITAMIN W/MINERALS CH
1.0000 | ORAL_TABLET | Freq: Every day | ORAL | Status: DC
  Administered 2020-04-20: 1 via ORAL
  Filled 2020-04-20: qty 1

## 2020-04-20 MED ORDER — LORAZEPAM 2 MG/ML IJ SOLN
1.0000 mg | INTRAMUSCULAR | Status: DC | PRN
Start: 2020-04-20 — End: 2020-04-21
  Administered 2020-04-20: 4 mg via INTRAVENOUS
  Administered 2020-04-20 (×2): 2 mg via INTRAVENOUS
  Administered 2020-04-21 (×2): 4 mg via INTRAVENOUS
  Filled 2020-04-20 (×3): qty 2
  Filled 2020-04-20 (×2): qty 1

## 2020-04-20 MED ORDER — THIAMINE HCL 100 MG/ML IJ SOLN: 100.0000 mg | Freq: Every day | INTRAMUSCULAR | Status: DC

## 2020-04-20 NOTE — Evaluation (Signed)
Clinical/Bedside Swallow Evaluation Patient Details  Name: James [name] MRN: [id number] Date of Birth: 10/07/1960  Today's Date: 04/20/2020 Time: SLP Start Time (ACUTE ONLY): 0814 SLP Stop Time (ACUTE ONLY): 1300 SLP Time Calculation (min) (ACUTE ONLY): 14.25 min  Past Medical History:  Past Medical History:  Diagnosis Date  . Acute on chronic respiratory failure with hypoxia ([place]) 06/08/2013  . CAP (community acquired pneumonia) 05/17/2018   See admit 05/17/18 ? rml  with   covid pcr neg - rx augmentin > f/u cxr in 4-6 weeks is fine unless condition declines   . Community acquired pneumonia of right lower lobe of lung 05/17/2018   See admit 05/17/18 ? rml  with   covid pcr neg - rx augmentin > f/u cxr in 4-6 weeks is fine unless condition declines   . COPD (chronic obstructive pulmonary disease) ([place])    not on home  . Diabetes ([place])   . Dyspnea   . Hypertension   . Pneumonia 04/06/2016  . Pneumonia due to COVID-19 virus 10/19/2019  . Pneumothorax    2016, fell from ladder  . 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 Tick bite 06/03/2018   Past Surgical History:  Past Surgical History:  Procedure Laterality Date  . VIDEO ASSISTED THORACOSCOPY (VATS)/THOROCOTOMY Left 06/11/2013   Procedure: VIDEO ASSISTED THORACOSCOPY (VATS)/THOROCOTOMY;  Surgeon: Ivin Poot, MD;  Location: [place];  Service: Thoracic;  Laterality: Left;  [name] VIDEO BRONCHOSCOPY N/A 06/11/2013   Procedure: VIDEO BRONCHOSCOPY;  Surgeon: Ivin Poot, MD;  Location: [place];  Service: Thoracic;  Laterality: N/A;  . VIDEO BRONCHOSCOPY N/A 08/10/2018   Procedure: VIDEO BRONCHOSCOPY WITH FLUORO;  Surgeon: Candee Furbish, MD;  [name] END[facility]COPY;  Service: Endoscopy;  Laterality: N/A;   HPI:  Pt is a 60 y.o. male with medical history significant for recurrent pneumonia, COPD O2 and prednisone dependent, chronic respiratory failure on home O2 2-4 LNC, COVID-19 infection in 10/2019, HTN, T2DM. Pt presented with SOB/DOE, cough  and wheezing. CT chest 4/17: Consolidative changes in the posterior right lower lobe and posterior RIGHT middle lobe, likely infectious. Tree-in-bud type opacities in the anterior right upper lobe and  right middle lobe, consistent with infectious/inflammatory process. Emphysema. Pt was last seen by SLP in 09/12/18 and demonstrated signs of aspiration and dyspnea with thin liquids, but passed Yale swallow screening at that time. MBS 08/11/18 was WNL.   Assessment / Plan / Recommendation Clinical Impression  Pt was seen for bedside swallow evaluation and he denied any symptoms of oropharyngeal dysphagia even during periods of dyspnea. Oral mechanism exam was James [name] and he was edentulous. He tolerated all solids and liquids without signs or symptoms of oropharyngeal dysphagia. Pt's current diet of regular textures with thin liquids will be continued. Considering the results of imaging, a modified barium swallow study is recommended to rule out silent aspiration, but it cannot be conducted today due to scheduling conflict with radiology. SLP Visit Diagnosis: Dysphagia, unspecified (R13.10)    Aspiration Risk  Mild aspiration risk    Diet Recommendation Regular;Thin liquid (Continue current diet until MBS completed)   Liquid Administration via: Cup;Straw Medication Administration: Whole meds with liquid Supervision: Patient able to self feed Compensations: Slow rate (especially if dyspneic) Postural Changes: Seated upright at 90 degrees    Other  Recommendations Oral Care Recommendations: Oral care BID   Follow up Recommendations        Frequency and Duration min 2x/week  1 week  Prognosis        Swallow Study   General Date of Onset: 04/19/20 HPI: Pt is a 60 y.o. male with medical history significant for recurrent pneumonia, COPD O2 and prednisone dependent, chronic respiratory failure on home O2 2-4 LNC, COVID-19 infection in 10/2019, HTN, T2DM. Pt presented with SOB/DOE, cough and  wheezing. CT chest 4/17: Consolidative changes in the posterior right lower lobe and posterior RIGHT middle lobe, likely infectious. Tree-in-bud type opacities in the anterior right upper lobe and  right middle lobe, consistent with infectious/inflammatory process. Emphysema. Pt was last seen by SLP in 09/12/18 and demonstrated signs of aspiration and dyspnea with thin liquids, but passed Yale swallow screening at that time. MBS 08/11/18 was WNL. Type of Study: Bedside Swallow Evaluation Previous Swallow Assessment: See HPI Diet Prior to this Study: Regular;Thin liquids Temperature Spikes Noted: No Respiratory Status: Nasal cannula History of Recent Intubation: No Behavior/Cognition: Alert;Cooperative;Pleasant mood Oral Cavity Assessment: Within Functional Limits Oral Care Completed by SLP: No Oral Cavity - Dentition: Edentulous Vision: Functional for self-feeding Self-Feeding Abilities: Able to feed self Patient Positioning: Upright in bed;Postural control adequate for testing Baseline Vocal Quality: Normal Volitional Cough: Strong Volitional Swallow: Able to elicit    Oral/Motor/Sensory Function Overall Oral Motor/Sensory Function: Within functional limits   Ice Chips Ice chips: Not tested   Thin Liquid Thin Liquid: Within functional limits Presentation: Straw    Nectar Thick Nectar Thick Liquid: Not tested   Honey Thick Honey Thick Liquid: Not tested   Puree Puree: Within functional limits Presentation: Spoon   Solid   James [name], James [name], James [name] Office number [phone] Pager [phone] Solid: Within functional limits Presentation: Self Fed      [name] 04/20/2020,1:21 PM

## 2020-04-20 NOTE — Progress Notes (Signed)
PROGRESS NOTE    James [name]  YTK:[id number] DOB: August 18, 1960 DOA: 04/19/2020 PCP: [name], MD    Brief Narrative:  60 year old male with COPD and chronic hypoxia on 2-4 liters of oxygen, recurrent pneumonia and hospital admissions, COVID-19 in 10/2019, HTN, diabetes type 2 presented with sudden onset worsening shortness of breath , cough , wheezing, wheezing for 1 day.  Patient has been recently hospitalized in March 2022 with COPD exacerbation and pneumonia.  Discharged on prednisone taper and currently taking prednisone 10 mg twice daily. Patient is on poor health, he continues to drink alcohol. In the emergency room, temperature 102.  Chest x-ray consistent with right lower lobe pneumonia.  Cultures were drawn.  Initially on respiratory status needing BiPAP support so admitted to progressive care unit.   Assessment & Plan:   Principal Problem:   Sepsis ([place]) Active Problems:   Hypertension   History of alcohol use   Chronic respiratory failure with hypoxia (HCC)   Type 2 diabetes mellitus without complication, without long-term current use of insulin (HCC)   COPD with acute exacerbation (HCC)   Lactic acid acidosis   HCAP (healthcare-associated pneumonia)   Obesity (BMI 30.0-34.9)  COPD with acute exacerbation.  Acute on chronic hypoxemic respiratory failure due to COPD exacerbation.  Sepsis present on admission.  Right lower lobe pneumonia and also suspect chronic aspiration pneumonitis: - aggressive bronchodilator therapy, IV steroids, inhalational steroids, scheduled and as needed bronchodilators, deep breathing exercises, incentive spirometry, chest physiotherapy and flutter valve therapy.  BiPAP as needed.  Keep on oxygen to keep saturations more than 90%. -With recent recurrent hospitalization, agree with broad-spectrum antibiotics for next 24 hours. Blood cultures, sputum cultures, Legionella and streptococcal antigen. Right lower lobe pneumonia, rule out silent  aspiration, speech therapy consultation. With recurrent pneumonia, a CT scan of the chest was done that shows consolidation on the right lower lobe, no evidence of malignancy. With severe persistent symptoms and end-stage COPD, patient will benefit with palliative care consultation, consult placed.  Type 2 diabetes: Blood sugars stable.  On high-dose steroids.  Remains on insulin.  Essential hypertension: Blood pressures are stable on home medications.  Alcohol intake: Patient drinks 2-3 bottles of beer 3 days a week, probably reporting less than that he drinks.  Today with some tremulousness.  Will use CIWA scale, benzodiazepines and multivitamins to prevent delirium tremens.  Counseled.  Also discussed with family member.   DVT prophylaxis: enoxaparin (LOVENOX) injection 40 mg Start: 04/19/20 0915   Code Status: Full code Family Communication: Patient's sister on the phone.  Called and updated. Disposition Plan: Status is: Inpatient  Remains inpatient appropriate because:IV treatments appropriate due to intensity of illness or inability to take PO and Inpatient level of care appropriate due to severity of illness   Dispo: The patient is from: Home              Anticipated d/c is to: Home              Patient currently is not medically stable to d/c.   Difficult to place patient No         Consultants:   None  Procedures:   None  Antimicrobials:  Antibiotics Given (last 72 hours)    Date/Time Action Medication Dose Rate   04/19/20 0749 New Bag/Given   cefTRIAXone (ROCEPHIN) 2 g in sodium chloride 0.9 % 100 mL IVPB 2 g 200 mL/hr   04/19/20 0829 New Bag/Given   azithromycin (ZITHROMAX)  500 mg in sodium chloride 0.9 % 250 mL IVPB 500 mg 250 mL/hr   04/19/20 1001 New Bag/Given   vancomycin (VANCOREADY) IVPB 2000 mg/400 mL 2,000 mg 200 mL/hr   04/19/20 1241 New Bag/Given   ceFEPIme (MAXIPIME) 2 g in sodium chloride 0.9 % 100 mL IVPB 2 g 200 mL/hr   04/19/20 2238 New  Bag/Given   ceFEPIme (MAXIPIME) 2 g in sodium chloride 0.9 % 100 mL IVPB 2 g 200 mL/hr   04/20/20 0535 New Bag/Given   ceFEPIme (MAXIPIME) 2 g in sodium chloride 0.9 % 100 mL IVPB 2 g 200 mL/hr   04/20/20 0949 New Bag/Given   vancomycin (VANCOREADY) IVPB 1750 mg/350 mL 1,750 mg 175 mL/hr         Subjective: Patient seen and examined.  He thinks his breathing is better than yesterday.  Temperature 102 per last 24 hours.  No fever overnight.  Coughs with some yellow sputum.  Denies any shortness of breath at rest.  Objective: Vitals:   04/20/20 0754 04/20/20 0757 04/20/20 0758 04/20/20 0840  BP:    129/84  Pulse:    76  Resp:    18  Temp:    98.3 F (36.8 C)  TempSrc:    Oral  SpO2: 98% 100% 98% 98%  Weight:      Height:        Intake/Output Summary (Last 24 hours) at 04/20/2020 1058 Last data filed at 04/20/2020 0841 Gross per 24 hour  Intake 2337.76 ml  Output 2250 ml  Net 87.76 ml   Filed Weights   04/19/20 0654 04/19/20 1327 04/20/20 0325  Weight: 91 kg 90.8 kg 90.5 kg    Examination:  General exam: Appears chronically sick looking.  Frail and debilitated. He is mildly tremulous and shaky. Respiratory system: Mostly conducted upper airway sounds.  He has expiratory crackles on the right posterior base. Cardiovascular system: S1 & S2 heard, RRR. No JVD, murmurs, rubs, gallops or clicks. No pedal edema. Gastrointestinal system: Distended but nontender.  Bowel sounds present.   Central nervous system: Alert and oriented. No focal neurological deficits.  Generalized weakness. Extremities: Symmetric 5 x 5 power.  Generalized weakness.   Data Reviewed: I have personally reviewed following labs and imaging studies  CBC: Recent Labs  Lab 04/19/20 0705 04/19/20 0710 04/19/20 0904 04/20/20 0329  WBC 18.8*  --   --  17.2*  NEUTROABS 14.9*  --   --  16.2*  HGB 14.1 15.6 12.6* 11.7*  HCT 44.8 46.0 37.0* 37.8*  MCV 93.1  --   --  92.2  PLT 341  --   --  462    Basic Metabolic Panel: Recent Labs  Lab 04/19/20 0705 04/19/20 0710 04/19/20 0904 04/20/20 0329  NA 143 141 140 140  K 3.9 3.8 4.0 4.2  CL 103 101  --  105  CO2 29  --   --  30  GLUCOSE 134* 131*  --  201*  BUN 14 18  --  16  CREATININE 0.99 0.90  --  0.77  CALCIUM 9.5  --   --  8.9   GFR: Estimated Creatinine Clearance: 109.9 mL/min (by C-G formula based on SCr of 0.77 mg/dL). Liver Function Tests: Recent Labs  Lab 04/19/20 0705  AST 31  ALT 29  ALKPHOS 43  BILITOT 0.6  PROT 6.5  ALBUMIN 4.0   No results for input(s): LIPASE, AMYLASE in the last 168 hours. No results for input(s): AMMONIA in the  last 168 hours. Coagulation Profile: Recent Labs  Lab 04/19/20 0705 04/20/20 0329  INR 0.9 1.0   Cardiac Enzymes: No results for input(s): CKTOTAL, CKMB, CKMBINDEX, TROPONINI in the last 168 hours. BNP (last 3 results) No results for input(s): PROBNP in the last 8760 hours. HbA1C: No results for input(s): HGBA1C in the last 72 hours. CBG: Recent Labs  Lab 04/19/20 0756 04/19/20 1608 04/19/20 2112 04/20/20 0619  GLUCAP 151* 286* 215* 221*   Lipid Profile: No results for input(s): CHOL, HDL, LDLCALC, TRIG, CHOLHDL, LDLDIRECT in the last 72 hours. Thyroid Function Tests: Recent Labs    04/19/20 1546  TSH 0.325*   Anemia Panel: No results for input(s): VITAMINB12, FOLATE, FERRITIN, TIBC, IRON, RETICCTPCT in the last 72 hours. Sepsis Labs: Recent Labs  Lab 04/19/20 0705 04/19/20 0918 04/19/20 1546  PROCALCITON  --  0.67  --   LATICACIDVEN 2.6* 2.3* 3.0*    Recent Results (from the past 240 hour(s))  Resp Panel by RT-PCR (Flu A&B, Covid) Nasopharyngeal Swab     Status: None   Collection Time: 04/19/20  6:52 AM   Specimen: Nasopharyngeal Swab; Nasopharyngeal(NP) swabs in vial transport medium  Result Value Ref Range Status   SARS Coronavirus 2 by RT PCR NEGATIVE NEGATIVE Final    Comment: (NOTE) SARS-CoV-2 target nucleic acids are NOT  DETECTED.  The SARS-CoV-2 RNA is generally detectable in upper respiratory specimens during the acute phase of infection. The lowest concentration of SARS-CoV-2 viral copies this assay can detect is 138 copies/mL. A negative result does not preclude SARS-Cov-2 infection and should not be used as the sole basis for treatment or other patient management decisions. A negative result may occur with  improper specimen collection/handling, submission of specimen other than nasopharyngeal swab, presence of viral mutation(s) within the areas targeted by this assay, and inadequate number of viral copies(<138 copies/mL). A negative result must be combined with clinical observations, patient history, and epidemiological information. The expected result is Negative.  Fact Sheet for Patients:  EntrepreneurPulse.com.au  Fact Sheet for Healthcare Providers:  IncredibleEmployment.be  This test is no t yet approved or cleared by the [place] FDA and  has been authorized for detection and/or diagnosis of SARS-CoV-2 by FDA under an Emergency Use Authorization (EUA). This EUA will remain  in effect (meaning this test can be used) for the duration of the COVID-19 declaration under Section 564(b)(1) of the Act, 21 U.S.C.section 360bbb-3(b)(1), unless the authorization is terminated  or revoked sooner.       Influenza A by PCR NEGATIVE NEGATIVE Final   Influenza B by PCR NEGATIVE NEGATIVE Final    Comment: (NOTE) The Xpert Xpress SARS-CoV-2/FLU/RSV plus assay is intended as an aid in the diagnosis of influenza from Nasopharyngeal swab specimens and should not be used as a sole basis for treatment. Nasal washings and aspirates are unacceptable for Xpert Xpress SARS-CoV-2/FLU/RSV testing.  Fact Sheet for Patients: EntrepreneurPulse.com.au  Fact Sheet for Healthcare Providers: IncredibleEmployment.be  This test is not yet  approved or cleared by the [place] FDA and has been authorized for detection and/or diagnosis of SARS-CoV-2 by FDA under an Emergency Use Authorization (EUA). This EUA will remain in effect (meaning this test can be used) for the duration of the COVID-19 declaration under Section 564(b)(1) of the Act, 21 U.S.C. section 360bbb-3(b)(1), unless the authorization is terminated or revoked.  Performed at [facility], [place] [address] [place], [place] [address]   Blood culture (routine x 2)  Status: None (Preliminary result)   Collection Time: 04/19/20  6:53 AM   Specimen: BLOOD RIGHT HAND  Result Value Ref Range Status   Specimen Description BLOOD RIGHT HAND  Final   Special Requests   Final    BOTTLES DRAWN AEROBIC AND ANAEROBIC Blood Culture results may not be optimal due to an inadequate volume of blood received in culture bottles   Culture   Final    NO GROWTH < 12 HOURS Performed at [facility], [place] [address], [place], [place] [address]    Report Status PENDING  Incomplete  Blood culture (routine x 2)     Status: None (Preliminary result)   Collection Time: 04/19/20  6:58 AM   Specimen: BLOOD LEFT HAND  Result Value Ref Range Status   Specimen Description BLOOD LEFT HAND  Final   Special Requests   Final    BOTTLES DRAWN AEROBIC AND ANAEROBIC Blood Culture adequate volume   Culture   Final    NO GROWTH < 12 HOURS Performed at [facility], [place] [address] [place], [place] [address]    Report Status PENDING  Incomplete  MRSA PCR Screening     Status: None   Collection Time: 04/19/20  8:56 AM   Specimen: Nasopharyngeal  Result Value Ref Range Status   MRSA by PCR NEGATIVE NEGATIVE Final    Comment:        The GeneXpert MRSA Assay (FDA approved for NASAL specimens only), is one component of a comprehensive MRSA colonization surveillance program. It is not intended to diagnose MRSA infection nor to guide or monitor treatment for MRSA  infections. Performed at [facility], [place] [address], [place], [place] [address]          Radiology Studies: CT CHEST WO CONTRAST  Result Date: 04/20/2020 CLINICAL DATA:  Pneumonia in the RIGHT LOWER lobe. Rule out malignancy. EXAM: CT CHEST WITHOUT CONTRAST TECHNIQUE: Multidetector CT imaging of the chest was performed following the standard protocol without IV contrast. COMPARISON:  Portable chest on 04/19/2020 and CT of the chest on 07/05/2019 FINDINGS: Cardiovascular: Heart size normal. No pericardial effusion. Minimal coronary artery calcification. There is atherosclerotic calcification of the thoracic aorta not associated with aneurysm. The noncontrast appearance of the pulmonary arteries is normal. Mediastinum/Nodes: Normal esophagus. No significant mediastinal, hilar, or axillary adenopathy. The visualized portion of the thyroid gland has a normal appearance. Lungs/Pleura: There is panlobular emphysema of the lung apices. Paraseptal emphysema particularly noted in the perihilar regions. Focal Tree-in-bud type opacities are identified in the anterior RIGHT UPPER lobe and RIGHT middle lobe. There is consolidation in the posterior RIGHT LOWER lobe and posterior RIGHT middle lobe, likely infectious. There is no central obstruction. Airways are patent. No suspicious pulmonary nodules. Upper Abdomen: Gallbladder is present. UPPER abdomen is unremarkable. Musculoskeletal: There is mild anterior compression deformity of T11 and T12, chronic in appearance. IMPRESSION: 1. Consolidative changes in the posterior RIGHT LOWER lobe and posterior RIGHT middle lobe, likely infectious. 2. Tree-in-bud type opacities in the anterior RIGHT UPPER lobe and RIGHT middle lobe, consistent with infectious/inflammatory process. 3. No adenopathy or struck shin to indicate presence of malignancy. 4. Aortic Atherosclerosis (ICD10-I70.0) and Emphysema (ICD10-J43.9). 5. Chronic anterior wedge compression fractures of T11  and T12. Electronically Signed   By: Nolon Nations M.D.   On: 04/20/2020 09:43   DG Chest Portable 1 View  Result Date: 04/19/2020 CLINICAL DATA:  Shortness of breath, respiratory distress. EXAM: PORTABLE CHEST 1  VIEW COMPARISON:  Chest x-rays dated 03/06/2020 and 11/16/2019. FINDINGS: New dense opacity at the RIGHT lung base, suspected pneumonia. No pleural effusion or pneumothorax is seen. Multiple old healed rib fractures bilaterally. No acute appearing osseous abnormality. Heart size and mediastinal contours are stable given the semi-erect patient positioning. IMPRESSION: New dense opacity at the RIGHT lung base, suspected pneumonia. Consider chest CT for confirmation and to exclude the alternative possibility of malignancy. Electronically Signed   By: Franki Cabot M.D.   On: 04/19/2020 07:46        Scheduled Meds: . dextromethorphan-guaiFENesin  1 tablet Oral BID  . enoxaparin (LOVENOX) injection  40 mg Subcutaneous Q24H  . folic acid  1 mg Oral Daily  . insulin aspart  0-9 Units Subcutaneous TID WC  . losartan  100 mg Oral Daily  . methylPREDNISolone (SOLU-MEDROL) injection  60 mg Intravenous Q8H  . metoprolol succinate  25 mg Oral Daily  . mometasone-formoterol  2 puff Inhalation BID  . multivitamin with minerals  1 tablet Oral Daily  . thiamine  100 mg Oral Daily   Or  . thiamine  100 mg Intravenous Daily  . umeclidinium bromide  1 puff Inhalation Daily   Continuous Infusions: . ceFEPime (MAXIPIME) IV 2 g (04/20/20 0535)  . vancomycin 1,750 mg (04/20/20 0949)     LOS: 1 day    Time spent: 35 minutes    Barb Merino, MD [facility] Pager [phone]

## 2020-04-20 NOTE — Plan of Care (Signed)

## 2020-04-21 ENCOUNTER — Inpatient Hospital Stay (HOSPITAL_COMMUNITY): Payer: Medicaid Other

## 2020-04-21 DIAGNOSIS — F10231 Alcohol dependence with withdrawal delirium: Secondary | ICD-10-CM

## 2020-04-21 DIAGNOSIS — Z515 Encounter for palliative care: Secondary | ICD-10-CM

## 2020-04-21 DIAGNOSIS — J189 Pneumonia, unspecified organism: Secondary | ICD-10-CM | POA: Diagnosis not present

## 2020-04-21 DIAGNOSIS — F10931 Alcohol use, unspecified with withdrawal delirium: Secondary | ICD-10-CM

## 2020-04-21 DIAGNOSIS — J441 Chronic obstructive pulmonary disease with (acute) exacerbation: Secondary | ICD-10-CM

## 2020-04-21 DIAGNOSIS — Z7189 Other specified counseling: Secondary | ICD-10-CM

## 2020-04-21 LAB — BLOOD GAS, ARTERIAL
Acid-base deficit: 0.4 mmol/L (ref 0.0–2.0)
Bicarbonate: 25.4 mmol/L (ref 20.0–28.0)
Drawn by: 418751
FIO2: 36
O2 Saturation: 95.5 %
Patient temperature: 37
pCO2 arterial: 54.6 mmHg — ABNORMAL HIGH (ref 32.0–48.0)
pH, Arterial: 7.29 — ABNORMAL LOW (ref 7.350–7.450)
pO2, Arterial: 89 mmHg (ref 83.0–108.0)

## 2020-04-21 LAB — CBC WITH DIFFERENTIAL/PLATELET
Abs Immature Granulocytes: 0.14 10*3/uL — ABNORMAL HIGH (ref 0.00–0.07)
Basophils Absolute: 0 10*3/uL (ref 0.0–0.1)
Basophils Relative: 0 %
Eosinophils Absolute: 0 10*3/uL (ref 0.0–0.5)
Eosinophils Relative: 0 %
HCT: 36.7 % — ABNORMAL LOW (ref 39.0–52.0)
Hemoglobin: 11.3 g/dL — ABNORMAL LOW (ref 13.0–17.0)
Immature Granulocytes: 1 %
Lymphocytes Relative: 2 %
Lymphs Abs: 0.3 10*3/uL — ABNORMAL LOW (ref 0.7–4.0)
MCH: 28.9 pg (ref 26.0–34.0)
MCHC: 30.8 g/dL (ref 30.0–36.0)
MCV: 93.9 fL (ref 80.0–100.0)
Monocytes Absolute: 0.7 10*3/uL (ref 0.1–1.0)
Monocytes Relative: 4 %
Neutro Abs: 15.8 10*3/uL — ABNORMAL HIGH (ref 1.7–7.7)
Neutrophils Relative %: 93 %
Platelets: 284 10*3/uL (ref 150–400)
RBC: 3.91 MIL/uL — ABNORMAL LOW (ref 4.22–5.81)
RDW: 14.5 % (ref 11.5–15.5)
WBC: 17 10*3/uL — ABNORMAL HIGH (ref 4.0–10.5)
nRBC: 0 % (ref 0.0–0.2)

## 2020-04-21 LAB — BASIC METABOLIC PANEL
Anion gap: 7 (ref 5–15)
BUN: 23 mg/dL — ABNORMAL HIGH (ref 6–20)
CO2: 28 mmol/L (ref 22–32)
Calcium: 9.3 mg/dL (ref 8.9–10.3)
Chloride: 105 mmol/L (ref 98–111)
Creatinine, Ser: 0.87 mg/dL (ref 0.61–1.24)
GFR, Estimated: >60 mL/min (ref >60)
Glucose, Bld: 157 mg/dL — ABNORMAL HIGH (ref 70–99)
Potassium: 4.9 mmol/L (ref 3.5–5.1)
Sodium: 140 mmol/L (ref 135–145)

## 2020-04-21 LAB — MAGNESIUM: Magnesium: 2.3 mg/dL (ref 1.7–2.4)

## 2020-04-21 LAB — GLUCOSE, CAPILLARY
Glucose-Capillary: 169 mg/dL — ABNORMAL HIGH (ref 70–99)
Glucose-Capillary: 196 mg/dL — ABNORMAL HIGH (ref 70–99)
Glucose-Capillary: 173 mg/dL — ABNORMAL HIGH (ref 70–99)
Glucose-Capillary: 191 mg/dL — ABNORMAL HIGH (ref 70–99)

## 2020-04-21 LAB — TROPONIN I (HIGH SENSITIVITY): Troponin I (High Sensitivity): 19 ng/L — ABNORMAL HIGH (ref <18)

## 2020-04-21 LAB — PHOSPHORUS: Phosphorus: 3.4 mg/dL (ref 2.5–4.6)

## 2020-04-21 MED ORDER — DEXMEDETOMIDINE HCL IN NACL 400 MCG/100ML IV SOLN
0.2000 ug/kg/h | INTRAVENOUS | Status: DC
  Administered 2020-04-21: 0.5 ug/kg/h via INTRAVENOUS
  Administered 2020-04-21: 0.3 ug/kg/h via INTRAVENOUS
  Filled 2020-04-21: qty 200
  Filled 2020-04-21: qty 100

## 2020-04-21 MED ORDER — METHYLPREDNISOLONE SODIUM SUCC 40 MG IJ SOLR
40.0000 mg | Freq: Three times a day (TID) | INTRAMUSCULAR | Status: DC
  Administered 2020-04-21 – 2020-04-22 (×4): 40 mg via INTRAVENOUS
  Filled 2020-04-21 (×4): qty 1

## 2020-04-21 MED ORDER — HYDRALAZINE HCL 20 MG/ML IJ SOLN
10.0000 mg | Freq: Four times a day (QID) | INTRAMUSCULAR | Status: DC | PRN
  Administered 2020-04-21: 10 mg via INTRAVENOUS
  Filled 2020-04-21: qty 1

## 2020-04-21 MED ORDER — VANCOMYCIN HCL 1750 MG/350ML IV SOLN: 1750.0000 mg | INTRAVENOUS | Status: DC

## 2020-04-21 MED ORDER — ALBUTEROL SULFATE (2.5 MG/3ML) 0.083% IN NEBU
2.5000 mg | INHALATION_SOLUTION | Freq: Three times a day (TID) | RESPIRATORY_TRACT | Status: DC
  Administered 2020-04-22 – 2020-04-24 (×7): 2.5 mg via RESPIRATORY_TRACT
  Filled 2020-04-21 (×7): qty 3

## 2020-04-21 MED ORDER — THIAMINE HCL 100 MG/ML IJ SOLN: Freq: Once | INTRAVENOUS | Status: DC

## 2020-04-21 MED ORDER — LORAZEPAM 2 MG/ML IJ SOLN: 2.0000 mg | INTRAMUSCULAR | Status: DC | PRN

## 2020-04-21 MED ORDER — DIAZEPAM 5 MG/ML IJ SOLN
5.0000 mg | Freq: Once | INTRAMUSCULAR | Status: AC
  Administered 2020-04-21: 5 mg via INTRAVENOUS
  Filled 2020-04-21: qty 2

## 2020-04-21 MED ORDER — DIAZEPAM 5 MG/ML IJ SOLN
5.0000 mg | Freq: Four times a day (QID) | INTRAMUSCULAR | Status: DC | PRN
  Administered 2020-04-22: 5 mg via INTRAVENOUS
  Filled 2020-04-21: qty 2

## 2020-04-21 MED ORDER — CHLORDIAZEPOXIDE HCL 25 MG PO CAPS
25.0000 mg | ORAL_CAPSULE | Freq: Four times a day (QID) | ORAL | Status: AC | PRN
  Administered 2020-04-22: 25 mg via ORAL
  Filled 2020-04-21: qty 1

## 2020-04-21 MED ORDER — VANCOMYCIN HCL 1750 MG/350ML IV SOLN
1750.0000 mg | INTRAVENOUS | Status: DC
  Administered 2020-04-21: 1750 mg via INTRAVENOUS
  Filled 2020-04-21 (×5): qty 350

## 2020-04-21 MED ORDER — CHLORHEXIDINE GLUCONATE CLOTH 2 % EX PADS
6.0000 | MEDICATED_PAD | Freq: Every day | CUTANEOUS | Status: DC
  Administered 2020-04-21 – 2020-04-24 (×4): 6 via TOPICAL

## 2020-04-21 MED ORDER — ALBUTEROL SULFATE (2.5 MG/3ML) 0.083% IN NEBU
2.5000 mg | INHALATION_SOLUTION | Freq: Four times a day (QID) | RESPIRATORY_TRACT | Status: DC
  Administered 2020-04-21 (×4): 2.5 mg via RESPIRATORY_TRACT
  Filled 2020-04-21 (×4): qty 3

## 2020-04-21 MED ORDER — THIAMINE HCL 100 MG/ML IJ SOLN
INTRAVENOUS | Status: AC
  Filled 2020-04-21 (×3): qty 1000

## 2020-04-21 MED ORDER — HYDROXYZINE HCL 25 MG PO TABS
25.0000 mg | ORAL_TABLET | Freq: Four times a day (QID) | ORAL | Status: AC | PRN
  Administered 2020-04-22 – 2020-04-23 (×2): 25 mg via ORAL
  Filled 2020-04-21 (×2): qty 1

## 2020-04-21 NOTE — Progress Notes (Signed)
Spoke with Dr. [name] Fulling and he gave order to change neuro checks to q4H frequency.

## 2020-04-21 NOTE — Progress Notes (Signed)
Overnight events noted.  Patient developed severe alcohol withdrawal as anticipated not enough to control with IV Ativan doses and needed transfer to ICU.  Currently on Precedex and with better symptom control.  Plan: We will transfer service to critical care until patient is able to be managed at regular medical floor.  Discussed with PCCM provider.  We will sign off.

## 2020-04-21 NOTE — Progress Notes (Signed)
Pt resting in bed upon assessment. He is very tremulous and a little restless. Pt denies complaints at this time. He is able to follow commands and is oriented x 3 - disoriented to year (states 2020). Pt given ativan per CIWA protocol.

## 2020-04-21 NOTE — Progress Notes (Signed)
Pt vital signs becoming unstable. He is increasingly hypertensive - last BP 178/111 HR increasing to 120s-130s and sustaining. Pt breathing also becoming more labored, patient face still bright red. His agitation is worsening and he is not redirectable. 4mg  PRN ativan administered per CIWA protocol. Thus far, ativan has not had any noticeable effect on patient. He has now received 16mg  ativan since 2107. Rapid response called for evaluation.

## 2020-04-21 NOTE — Progress Notes (Signed)
Overnight floor coverage progress note  Patient admitted for acute COPD exacerbation and right lower lobe pneumonia.  He is on antibiotics, aggressive bronchodilator therapy, IV steroids, inhalation steroids.  Also has a history of alcohol use (drinks 4 beers every night) and was placed on CIWA protocol on admission due to tremulousness.    Notified by RN that patient is undergoing severe alcohol withdrawal -tachycardic, hypertensive, agitated, confused, disoriented.  He has received a total of 16 mg Ativan since 9:07 PM and CIWA score >20.   Patient seen and examined at bedside.  Confused.  Oriented to self only.  Tremulous, restless.  Trying to get out of the bed and nursing staff at bedside trying to hold him down.  Tachycardic with heart rate in the 130s and hypertensive with systolic in 025E.  Tachypneic with respiratory rate in the 30s.  Satting 100% on 2 L supplemental oxygen, no change in oxygen requirement since dayshift.  On exam, has diminished breath sounds bilaterally, especially at the right lung base and wheezing.  Suspect altered mental status is due to severe alcohol withdrawal/DTs but hypercarbia also likely contributing given COPD exacerbation.  ABG pending.  Given the extent of his altered mental status, he will not be able to keep BiPAP mask on.  Needs Precedex.  Discussed with critical care MD and patient has been transferred to the ICU.

## 2020-04-21 NOTE — Progress Notes (Signed)
RT did not place patient on BIPAP at this time due to patient receiving precedex and would not be able to remove mask emergently if needed. Patient is currently resting on 2L [place]. RT will continue to monitor patient as needed.

## 2020-04-21 NOTE — Progress Notes (Signed)
Pt c/o sharp chest pain and pressure and SOB. Pt states it started approximately 30 minutes ago. Pt lung sounds are clear. O2 96% 2LNC. VS as charted. Pt restless. EKG pending. MD notified.

## 2020-04-21 NOTE — Progress Notes (Signed)
[facility] Progress Note Patient Name: James [name] DOB: [birth year] MRN: [id number]   Date of Service  04/21/2020  HPI/Events of Note  Patient originally admitted to the PCU due to right middle and lower lobe pneumonia, but developed progressive ETOH  withdrawal delirium and had to be transferred to the ICU to be started on a Precedex infusion for failed Ativan protocol therapy. He is currently asleep in bed.  eICU Interventions  New Patient Evaluation completed.        James [name] 04/21/2020, 3:56 AM

## 2020-04-21 NOTE — Progress Notes (Addendum)
NAME:  James [name], MRN:  [id number], DOB:  1960/12/06, LOS: 2 ADMISSION DATE:  04/19/2020, CONSULTATION DATE:  4/17 REFERRING MD:  EDP, CHIEF COMPLAINT:  AMS   History of Present Illness:   60 year old male with COPD and chronic hypoxia on 2-4 liters of oxygen, recurrent pneumonia and hospital admissions, COVID-19 in 10/2019, HTN, diabetes type 2 presented with sudden onset worsening shortness of breath , cough , wheezing, wheezingfor 1 day. Patient has been recently hospitalized in March 2022 with COPD exacerbation and pneumonia. Discharged on prednisone taper and currently taking prednisone 10 mg twice daily. Patient is on poor health, he continues to drink alcohol daily. In the emergency room, temperature 102. Chest x-ray consistent with right lower lobe pneumonia. Admitted by [facility], started on abx. Initially needed bipap for respiratory support but improved.   Had worsening DT's on 4/18 requiring ICU tx and precedex gtt.   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   Significant Hospital Events: Including procedures, antibiotic start and stop dates in addition to other pertinent events   . Cefepime 4/16>> . vanc 4/16>>>  Interim History / Subjective:  Tx ICU overnight for worsening ETOH withdrawal.   Now resting on precedex gtt  Resp status stable on [place]  Objective   Blood pressure (!) 149/86, pulse 80, temperature 98.2 F (36.8 C), temperature source Axillary, resp. rate 18, height 5\' 8"  (1.727 m), weight 91.9 kg, SpO2 94 %.         Intake/Output Summary (Last 24 hours) at 04/21/2020 0953 Last data filed at 04/21/2020 0547 Gross per 24 hour  Intake 2277.87 ml  Output 2401 ml  Net -123.13 ml   Filed Weights   04/19/20 1327 04/20/20 0325 04/21/20 0200  Weight: 90.8 kg 90.5 kg 91.9 kg    Examination: General: chronically ill appearing male, NAD  HENT: mm dry, no JVD  Lungs: resps even non labored on 2L [place], few coarse rhonchi  Cardiovascular: s1s2 rrr Abdomen: soft, non tender  Extremities: warm and dry, no edema  Neuro: sedated on precedex gtt, RASS -2, grimaces, opens eyes to pain, does not interact, does not follow commands, MAE spontaneously, protecting airway  Labs/imaging that I havepersonally reviewed  (right click and "Reselect all SmartList Selections" daily)  Cbc, bmet, abg, cxr  Resolved Hospital Problem list     Assessment & Plan:  AMS ETOH withdrawal  PLAN -  ICU monitoring  Thiamine, folate, MVI  HCAP  PLAN -  Supplemental O2 as needed  Abx as above  Follow cultures  F/u CXR  Pulmonary hygiene as able   ACEOPD  PLAN -  Continue solumedrol - wean as able  Cont abx as above  BD's   DM2  PLAN -  SSI   Hx HTN  PLAN -  continue home losartan, metoprolol    Best practice (right click and "Reselect all SmartList Selections" daily)  Diet:  NPO Pain/Anxiety/Delirium protocol (if indicated): Yes (RASS goal 0) VAP protocol (if indicated): Not indicated DVT prophylaxis: LMWH GI prophylaxis: N/A Glucose control:  SSI Yes Central venous access:  N/A Arterial line:  N/A Foley:  N/A Mobility:  bed rest  PT  consulted: N/A Last date of multidisciplinary goals of care discussion []  Code Status:  full code Disposition: ICU  Labs   CBC: Recent Labs  Lab 04/19/20 0705 04/19/20 0710 04/19/20 0904 04/20/20 0329 04/21/20 0325  WBC 18.8*  --   --  17.2* 17.0*  NEUTROABS 14.9*  --   --  16.2* 15.8*  HGB 14.1 15.6 12.6* 11.7* 11.3*  HCT 44.8 46.0 37.0* 37.8* 36.7*  MCV 93.1   --   --  92.2 93.9  PLT 341  --   --  254 675    Basic Metabolic Panel: Recent Labs  Lab 04/19/20 0705 04/19/20 0710 04/19/20 0904 04/20/20 0329 04/21/20 0325  NA 143 141 140 140 140  K 3.9 3.8 4.0 4.2 4.9  CL 103 101  --  105 105  CO2 29  --   --  30 28  GLUCOSE 134* 131*  --  201* 157*  BUN 14 18  --  16 23*  CREATININE 0.99 0.90  --  0.77 0.87  CALCIUM 9.5  --   --  8.9 9.3  MG  --   --   --   --  2.3  PHOS  --   --   --   --  3.4   GFR: Estimated Creatinine Clearance: 101.8 mL/min (by C-G formula based on SCr of 0.87 mg/dL). Recent Labs  Lab 04/19/20 0705 04/19/20 0918 04/19/20 1546 04/20/20 0329 04/21/20 0325  PROCALCITON  --  0.67  --   --   --   WBC 18.8*  --   --  17.2* 17.0*  LATICACIDVEN 2.6* 2.3* 3.0*  --   --     Liver Function Tests: Recent Labs  Lab 04/19/20 0705  AST 31  ALT 29  ALKPHOS 43  BILITOT 0.6  PROT 6.5  ALBUMIN 4.0   No results for input(s): LIPASE, AMYLASE in the last 168 hours. No results for input(s): AMMONIA in the last 168 hours.  ABG    Component Value Date/Time   PHART 7.290 (L) 04/21/2020 0201   PCO2ART 54.6 (H) 04/21/2020 0201   PO2ART 89.0 04/21/2020 0201   HCO3 25.4 04/21/2020 0201   TCO2 32 04/19/2020 0904   ACIDBASEDEF 0.4 04/21/2020 0201   O2SAT 95.5 04/21/2020 0201     Coagulation Profile: Recent Labs  Lab 04/19/20 0705 04/20/20 0329  INR 0.9 1.0    Cardiac Enzymes: No results for input(s): CKTOTAL, CKMB, CKMBINDEX, TROPONINI in the last 168 hours.  HbA1C: HbA1c, POC (prediabetic range)  Date/Time Value Ref Range Status  02/13/2020 09:15 AM 6.3 5.7 - 6.4 % Final   Hgb A1c MFr Bld  Date/Time Value Ref Range Status  03/26/2020 08:40 AM 6.7 (H) 4.8 - 5.6 % Final    Comment:    (NOTE) Pre diabetes:          5.7%-6.4%  Diabetes:              >6.4%  Glycemic control for   <7.0% adults with diabetes   10/24/2019 02:08 AM 6.8 (H) 4.8 - 5.6 % Final    Comment:    (NOTE)         Prediabetes:  5.7 - 6.4         Diabetes: >6.4         Glycemic control for adults with diabetes: <7.0     CBG: Recent Labs  Lab 04/20/20 1127 04/20/20 1614 04/20/20 2124 04/21/20 0226 04/21/20 0813  GLUCAP 227* 245* 184*  169* 196*     Critical care time: 31 mins    Nickolas Madrid, NP Pulmonary/Critical Care Medicine  04/21/2020  9:53 AM

## 2020-04-21 NOTE — Evaluation (Signed)
Physical Therapy Evaluation Patient Details Name: James [name] MRN: [id number] DOB: 12/08/1960 Today's Date: 04/21/2020   History of Present Illness  James [name] is a 60 y.o. male admitted for acute COPD exacerbation and right lower lobe pneumonia. PMH: recurrent pneumonia, COPD O2 and prednisone dependent, chronic respiratory failure on home O2 2-4 LNC, COVID-19 infection in 10/2019, HTN, T2DM, ETOH abuse.  Clinical Impression  Pt admitted with above diagnosis. Pt was able to stand at EOB with mod to max assist of 2 with unsteadiness on feet.  Pt leaning on heels. Pt confused and medicated therefore limited evaluation overall.  Will follow acutely.  Pt currently with functional limitations due to the deficits listed below (see PT Problem List). Pt will benefit from skilled PT to increase their independence and safety with mobility to allow discharge to the venue listed below.      Follow Up Recommendations  (TBA when pt not on meds, hopefully HHPT)    Equipment Recommendations  Rolling walker with 5" wheels    Recommendations for Other Services       Precautions / Restrictions Precautions Precautions: Fall Restrictions Weight Bearing Restrictions: No      Mobility  Bed Mobility Overal bed mobility: Needs Assistance Bed Mobility: Supine to Sit     Supine to sit: Mod assist;+2 for physical assistance     General bed mobility comments: Pt needed mod assist due to posterior lean with use of bed pads to scoot pt forward    Transfers Overall transfer level: Needs assistance Equipment used: 2 person hand held assist Transfers: Sit to/from Stand Sit to Stand: Mod assist;+2 physical assistance;From elevated surface;Max assist         General transfer comment: Pt stated he needed to urinate therefore PT and OT assisted pt to standing with pt initially with all weight on his heels wtih heavy posterior lean needing max assist but progressing to mod assist.  Malfunction with  primo fit therefore urinated on floor therefore cleaned pt and changed all linens prior to settling pt back in the bed.  Ambulation/Gait                Stairs            Wheelchair Mobility    Modified Rankin (Stroke Patients Only)       Balance Overall balance assessment: Needs assistance Sitting-balance support: No upper extremity supported;Feet supported;Bilateral upper extremity supported Sitting balance-Leahy Scale: Poor Sitting balance - Comments: Min guard to mod to sit EOB as pt with periods where he could sit but would thne have posterio rlean and need assist. Postural control: Posterior lean Standing balance support: Bilateral upper extremity supported;During functional activity Standing balance-Leahy Scale: Poor Standing balance comment: Needed heavy +2 mod to max assist initiialy to support pt with HHA of 2 as well.                             Pertinent Vitals/Pain Pain Assessment: No/denies pain    Home Living Family/patient expects to be discharged to:: Private residence Living Arrangements: Alone Available Help at Discharge: Family;Available PRN/intermittently Type of Home: House Home Access: Stairs to enter     Home Layout: One level Home Equipment: None      Prior Function Level of Independence: Independent         Comments: drives, manages his household; confirms that sister still assists with medications     Hand Dominance  Dominant Hand: Right    Extremity/Trunk Assessment   Upper Extremity Assessment Upper Extremity Assessment: Defer to OT evaluation    Lower Extremity Assessment Lower Extremity Assessment: RLE deficits/detail;LLE deficits/detail;Generalized weakness RLE Coordination: decreased gross motor LLE Coordination: decreased gross motor    Cervical / Trunk Assessment Cervical / Trunk Assessment: Normal  Communication   Communication: HOH  Cognition Arousal/Alertness: Lethargic;Suspect due to  medications Behavior During Therapy: Flat affect;Anxious Overall Cognitive Status: Impaired/Different from baseline Area of Impairment: Orientation;Memory;Following commands;Safety/judgement;Problem solving;Awareness                 Orientation Level: Disoriented to;Place;Time;Situation   Memory: Decreased short-term memory Following Commands: Follows one step commands inconsistently;Follows one step commands with increased time Safety/Judgement: Decreased awareness of safety   Problem Solving: Slow processing;Decreased initiation;Difficulty sequencing;Requires verbal cues;Requires tactile cues        General Comments General comments (skin integrity, edema, etc.): VSS with pt on 2LO2    Exercises     Assessment/Plan    PT Assessment Patient needs continued PT services  PT Problem List Decreased activity tolerance;Decreased balance;Decreased mobility;Decreased knowledge of use of DME;Decreased safety awareness;Decreased knowledge of precautions;Cardiopulmonary status limiting activity;Decreased coordination       PT Treatment Interventions DME instruction;Gait training;Functional mobility training;Therapeutic activities;Therapeutic exercise;Balance training;Patient/family education    PT Goals (Current goals can be found in the Care Plan section)  Acute Rehab PT Goals Patient Stated Goal: unable to state PT Goal Formulation: With patient Time For Goal Achievement: 05/05/20 Potential to Achieve Goals: Good    Frequency Min 3X/week   Barriers to discharge Decreased caregiver support      Co-evaluation PT/OT/SLP Co-Evaluation/Treatment: Yes Reason for Co-Treatment: Complexity of the patient's impairments (multi-system involvement);For patient/therapist safety PT goals addressed during session: Mobility/safety with mobility         AM-PAC PT "6 Clicks" Mobility  Outcome Measure Help needed turning from your back to your side while in a flat bed without using  bedrails?: A Lot Help needed moving from lying on your back to sitting on the side of a flat bed without using bedrails?: A Lot Help needed moving to and from a bed to a chair (including a wheelchair)?: Total Help needed standing up from a chair using your arms (e.g., wheelchair or bedside chair)?: Total Help needed to walk in hospital room?: Total Help needed climbing 3-5 steps with a railing? : Total 6 Click Score: 8    End of Session Equipment Utilized During Treatment: Gait belt;Oxygen Activity Tolerance: Patient limited by fatigue;Patient limited by lethargy Patient left: in bed;with call bell/phone within reach;with bed alarm set;with restraints reapplied Nurse Communication: Mobility status PT Visit Diagnosis: Unsteadiness on feet (R26.81);Muscle weakness (generalized) (M62.81)    Time: 1122-1201 PT Time Calculation (min) (ACUTE ONLY): 39 min   Charges:   PT Evaluation $PT Eval Moderate Complexity: 1 Mod PT Treatments $Therapeutic Activity: 8-22 mins        Zayvier Caravello M,PT Acute Rehab Services [phone] (pager)  Alvira Philips 04/21/2020, 2:47 PM

## 2020-04-21 NOTE — Progress Notes (Signed)
SLP Cancellation Note  Patient Details Name: DHRUVAN GULLION MRN: [id number] DOB: 1960-09-22   Cancelled treatment:       Reason Eval/Treat Not Completed: Patient's level of consciousness. Per RN pt unable to participate in MBS today due to level of consciousness. Will f/u as able.    Whitney Hillegass, Katherene Ponto 04/21/2020, 11:40 AM

## 2020-04-21 NOTE — Plan of Care (Signed)

## 2020-04-21 NOTE — Consult Note (Signed)
Reason for Consult:worsening delirium tremens Referring Physician:Dr. Shela Leff  James [name] is an 60 y.o. male.  HPI: 60 year old male with COPD and chronic hypoxia on 2-4 liters of oxygen, recurrent pneumonia and hospital admissions, COVID-19 in 10/2019, HTN, diabetes type 2 presented with sudden onset worsening shortness of breath , cough , wheezing, wheezing for 1 day.  Patient has been recently hospitalized in March 2022 with COPD exacerbation and pneumonia.  Discharged on prednisone taper and currently taking prednisone 10 mg twice daily. Patient is on poor health, he continues to drink alcohol daily. In the emergency room, temperature 102.  Chest x-ray consistent with right lower lobe pneumonia.  Cultures were drawn.  Initially on respiratory status needing BiPAP support so admitted to progressive care unit. Follow up CT scan of the chestt showed right middle and lower lobe pneumonia.  Patient has developed worsening delirium tremens manifested by tachycardia, agitation, hallucination and diaphoresis.  He has been on Ativan CIWA protocol which has not been successful.  Hence patient was transferred to the ICU for Precedex infusion and closer monitor/ management.  I have seen and examined the patient.  Even though he was able to follow all my commands, he was unable to contribute to the history.  Hence this consult was done after review of the chart and discussion with Dr. Wandra Feinstein.  Past Medical History:  Diagnosis Date  . Acute on chronic respiratory failure with hypoxia ([place]) 06/08/2013  . CAP (community acquired pneumonia) 05/17/2018   See admit 05/17/18 ? rml  with   covid pcr neg - rx augmentin > f/u cxr in 4-6 weeks is fine unless condition declines   . Community acquired pneumonia of right lower lobe of lung 05/17/2018   See admit 05/17/18 ? rml  with   covid pcr neg - rx augmentin > f/u cxr in 4-6 weeks is fine unless condition declines   . COPD (chronic obstructive pulmonary  disease) ([place])    not on home  . Diabetes ([place])   . Dyspnea   . Hypertension   . Pneumonia 04/06/2016  . Pneumonia due to COVID-19 virus 10/19/2019  . Pneumothorax    2016, fell from ladder  . 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 Tick bite 06/03/2018    Past Surgical History:  Procedure Laterality Date  . VIDEO ASSISTED THORACOSCOPY (VATS)/THOROCOTOMY Left 06/11/2013   Procedure: VIDEO ASSISTED THORACOSCOPY (VATS)/THOROCOTOMY;  Surgeon: Ivin Poot, MD;  Location: [facility];  Service: Thoracic;  Laterality: Left;  [name] VIDEO BRONCHOSCOPY N/A 06/11/2013   Procedure: VIDEO BRONCHOSCOPY;  Surgeon: Ivin Poot, MD;  Location: [facility];  Service: Thoracic;  Laterality: N/A;  . VIDEO BRONCHOSCOPY N/A 08/10/2018   Procedure: VIDEO BRONCHOSCOPY WITH FLUORO;  Surgeon: Candee Furbish, MD;  Location: [facility] ENDOSCOPY;  Service: Endoscopy;  Laterality: N/A;    Family History  Problem Relation Age of Onset  . Heart disease Father   . COPD Sister     Social History:  reports that he quit smoking about 8 years ago. His smoking use included cigarettes. He has a 60.00 pack-year smoking history. His smokeless tobacco use includes chew. He reports current alcohol use of about 28.0 standard drinks of alcohol per week. He reports current drug use. Drug: Marijuana.  Allergies:  Allergies  Allergen Reactions  . Gabapentin Other (See Comments)    hallucinations    Medications: reviewed  Results for orders placed or performed during the hospital encounter of 04/19/20 (from the past 48 hour(s))  Resp Panel by RT-PCR (Flu A&B, Covid) Nasopharyngeal Swab     Status: None   Collection Time: 04/19/20  6:52 AM   Specimen: Nasopharyngeal Swab; Nasopharyngeal(NP) swabs in vial transport medium  Result Value Ref Range   SARS Coronavirus 2 by RT PCR NEGATIVE NEGATIVE    Comment: (NOTE) SARS-CoV-2 target nucleic acids are NOT DETECTED.  The SARS-CoV-2 RNA is generally detectable in upper respiratory specimens  during the acute phase of infection. The lowest concentration of SARS-CoV-2 viral copies this assay can detect is 138 copies/mL. A negative result does not preclude SARS-Cov-2 infection and should not be used as the sole basis for treatment or other patient management decisions. A negative result may occur with  improper specimen collection/handling, submission of specimen other than nasopharyngeal swab, presence of viral mutation(s) within the areas targeted by this assay, and inadequate number of viral copies(<138 copies/mL). A negative result must be combined with clinical observations, patient history, and epidemiological information. The expected result is Negative.  Fact Sheet for Patients:  EntrepreneurPulse.com.au  Fact Sheet for Healthcare Providers:  IncredibleEmployment.be  This test is no t yet approved or cleared by the [place] FDA and  has been authorized for detection and/or diagnosis of SARS-CoV-2 by FDA under an Emergency Use Authorization (EUA). This EUA will remain  in effect (meaning this test can be used) for the duration of the COVID-19 declaration under Section 564(b)(1) of the Act, 21 U.S.C.section 360bbb-3(b)(1), unless the authorization is terminated  or revoked sooner.       Influenza A by PCR NEGATIVE NEGATIVE   Influenza B by PCR NEGATIVE NEGATIVE    Comment: (NOTE) The Xpert Xpress SARS-CoV-2/FLU/RSV plus assay is intended as an aid in the diagnosis of influenza from Nasopharyngeal swab specimens and should not be used as a sole basis for treatment. Nasal washings and aspirates are unacceptable for Xpert Xpress SARS-CoV-2/FLU/RSV testing.  Fact Sheet for Patients: EntrepreneurPulse.com.au  Fact Sheet for Healthcare Providers: IncredibleEmployment.be  This test is not yet approved or cleared by the [place] FDA and has been authorized for detection and/or diagnosis  of SARS-CoV-2 by FDA under an Emergency Use Authorization (EUA). This EUA will remain in effect (meaning this test can be used) for the duration of the COVID-19 declaration under Section 564(b)(1) of the Act, 21 U.S.C. section 360bbb-3(b)(1), unless the authorization is terminated or revoked.  Performed at [facility], [place] [address] [place], [place] [address]   Blood culture (routine x 2)     Status: None (Preliminary result)   Collection Time: 04/19/20  6:53 AM   Specimen: BLOOD RIGHT HAND  Result Value Ref Range   Specimen Description BLOOD RIGHT HAND    Special Requests      BOTTLES DRAWN AEROBIC AND ANAEROBIC Blood Culture results may not be optimal due to an inadequate volume of blood received in culture bottles   Culture      NO GROWTH 1 DAY Performed at [facility], [place] [address], [place], [place] [address]    Report Status PENDING   Blood culture (routine x 2)     Status: None (Preliminary result)   Collection Time: 04/19/20  6:58 AM   Specimen: BLOOD LEFT HAND  Result Value Ref Range   Specimen Description BLOOD LEFT HAND    Special Requests      BOTTLES DRAWN AEROBIC AND ANAEROBIC Blood Culture adequate volume   Culture      NO GROWTH 1 DAY Performed at  [facility], [place] [address], [place], [place] [address]    Report Status PENDING   CBC with Differential     Status: Abnormal   Collection Time: 04/19/20  7:05 AM  Result Value Ref Range   WBC 18.8 (H) 4.0 - 10.5 K/uL   RBC 4.81 4.22 - 5.81 MIL/uL   Hemoglobin 14.1 13.0 - 17.0 g/dL   HCT 44.8 39.0 - 52.0 %   MCV 93.1 80.0 - 100.0 fL   MCH 29.3 26.0 - 34.0 pg   MCHC 31.5 30.0 - 36.0 g/dL   RDW 14.3 11.5 - 15.5 %   Platelets 341 150 - 400 K/uL   nRBC 0.0 0.0 - 0.2 %   Neutrophils Relative % 79 %   Neutro Abs 14.9 (H) 1.7 - 7.7 K/uL   Lymphocytes Relative 15 %   Lymphs Abs 2.7 0.7 - 4.0 K/uL   Monocytes Relative 5 %   Monocytes Absolute 0.9 0.1 - 1.0 K/uL   Eosinophils Relative 1 %    Eosinophils Absolute 0.1 0.0 - 0.5 K/uL   Basophils Relative 0 %   Basophils Absolute 0.1 0.0 - 0.1 K/uL   Immature Granulocytes 0 %   Abs Immature Granulocytes 0.06 0.00 - 0.07 K/uL    Comment: Performed at [facility] [address], [place], [place] [address]  Comprehensive metabolic panel     Status: Abnormal   Collection Time: 04/19/20  7:05 AM  Result Value Ref Range   Sodium 143 135 - 145 mmol/L   Potassium 3.9 3.5 - 5.1 mmol/L   Chloride 103 98 - 111 mmol/L   CO2 29 22 - 32 mmol/L   Glucose, Bld 134 (H) 70 - 99 mg/dL    Comment: Glucose reference range applies only to samples taken after fasting for at least 8 hours.   BUN 14 6 - 20 mg/dL   Creatinine, Ser 0.99 0.61 - 1.24 mg/dL   Calcium 9.5 8.9 - 10.3 mg/dL   Total Protein 6.5 6.5 - 8.1 g/dL   Albumin 4.0 3.5 - 5.0 g/dL   AST 31 15 - 41 U/L   ALT 29 0 - 44 U/L   Alkaline Phosphatase 43 38 - 126 U/L   Total Bilirubin 0.6 0.3 - 1.2 mg/dL   GFR, Estimated >60 >60 mL/min    Comment: (NOTE) Calculated using the CKD-EPI Creatinine Equation (2021)    Anion gap 11 5 - 15    Comment: Performed at [facility] [address], [place], [place] [address]  Troponin I (High Sensitivity)     Status: Abnormal   Collection Time: 04/19/20  7:05 AM  Result Value Ref Range   Troponin I (High Sensitivity) 23 (H) <18 ng/L    Comment: (NOTE) Elevated high sensitivity troponin I (hsTnI) values and significant  changes across serial measurements may suggest ACS but many other  chronic and acute conditions are known to elevate hsTnI results.  Refer to the "Links" section for chest pain algorithms and additional  guidance. Performed at [facility], [place] [address], [place], [place] [address]   Lactic acid, plasma     Status: Abnormal   Collection Time: 04/19/20  7:05 AM  Result Value Ref Range   Lactic Acid, Venous 2.6 (HH) 0.5 - 1.9 mmol/L    Comment: CRITICAL RESULT CALLED TO, READ BACK BY AND VERIFIED  WITH: S.GORDY RN @ [phone] 04/19/2020 BY C.EDENS Performed at [facility], [place]  [address], [place], [place] [address]   Protime-INR     Status: None   Collection Time: 04/19/20  7:05 AM  Result Value Ref Range   Prothrombin Time 12.1 11.4 - 15.2 seconds   INR 0.9 0.8 - 1.2    Comment: (NOTE) INR goal varies based on device and disease states. Performed at [facility], [place] [address], [place], [place] [address]   APTT     Status: Abnormal   Collection Time: 04/19/20  7:05 AM  Result Value Ref Range   aPTT 23 (L) 24 - 36 seconds    Comment: Performed at [facility] [address], [place], [place] [address]  I-stat chem 8, ED (not at [facility] or [facility])     Status: Abnormal   Collection Time: 04/19/20  7:10 AM  Result Value Ref Range   Sodium 141 135 - 145 mmol/L   Potassium 3.8 3.5 - 5.1 mmol/L   Chloride 101 98 - 111 mmol/L   BUN 18 6 - 20 mg/dL   Creatinine, Ser 0.90 0.61 - 1.24 mg/dL   Glucose, Bld 131 (H) 70 - 99 mg/dL    Comment: Glucose reference range applies only to samples taken after fasting for at least 8 hours.   Calcium, Ion 1.20 1.15 - 1.40 mmol/L   TCO2 32 22 - 32 mmol/L   Hemoglobin 15.6 13.0 - 17.0 g/dL   HCT 46.0 39.0 - 52.0 %  POC CBG, ED     Status: Abnormal   Collection Time: 04/19/20  7:56 AM  Result Value Ref Range   Glucose-Capillary 151 (H) 70 - 99 mg/dL    Comment: Glucose reference range applies only to samples taken after fasting for at least 8 hours.  MRSA PCR Screening     Status: None   Collection Time: 04/19/20  8:56 AM   Specimen: Nasopharyngeal  Result Value Ref Range   MRSA by PCR NEGATIVE NEGATIVE    Comment:        The GeneXpert MRSA Assay (FDA approved for NASAL specimens only), is one component of a comprehensive MRSA colonization surveillance program. It is not intended to diagnose MRSA infection nor to guide or monitor treatment for MRSA infections. Performed at [facility], [place] [address],  [place], [place] [address]   I-Stat arterial blood gas, [facility] ED)     Status: Abnormal   Collection Time: 04/19/20  9:04 AM  Result Value Ref Range   pH, Arterial 7.415 7.350 - 7.450   pCO2 arterial 47.8 32.0 - 48.0 mmHg   pO2, Arterial 140 (H) 83.0 - 108.0 mmHg   Bicarbonate 30.7 (H) 20.0 - 28.0 mmol/L   TCO2 32 22 - 32 mmol/L   O2 Saturation 99.0 %   Acid-Base Excess 5.0 (H) 0.0 - 2.0 mmol/L   Sodium 140 135 - 145 mmol/L   Potassium 4.0 3.5 - 5.1 mmol/L   Calcium, Ion 1.19 1.15 - 1.40 mmol/L   HCT 37.0 (L) 39.0 - 52.0 %   Hemoglobin 12.6 (L) 13.0 - 17.0 g/dL   Patient temperature 98.6 F    Collection site Radial    Drawn by RT    Sample type ARTERIAL   Lactic acid, plasma     Status: Abnormal   Collection Time: 04/19/20  9:18 AM  Result Value Ref Range   Lactic Acid, Venous 2.3 (HH) 0.5 - 1.9 mmol/L    Comment: CRITICAL VALUE NOTED.  VALUE IS CONSISTENT WITH PREVIOUSLY REPORTED  AND CALLED VALUE. Performed at [facility], [place] [address], [place], [place] [address]   Troponin I (High Sensitivity)     Status: Abnormal   Collection Time: 04/19/20  9:18 AM  Result Value Ref Range   Troponin I (High Sensitivity) 29 (H) <18 ng/L    Comment: (NOTE) Elevated high sensitivity troponin I (hsTnI) values and significant  changes across serial measurements may suggest ACS but many other  chronic and acute conditions are known to elevate hsTnI results.  Refer to the "Links" section for chest pain algorithms and additional  guidance. Performed at [facility], [place] [address], [place], [place] [address]   Procalcitonin     Status: None   Collection Time: 04/19/20  9:18 AM  Result Value Ref Range   Procalcitonin 0.67 ng/mL    Comment:        Interpretation: PCT > 0.5 ng/mL and <= 2 ng/mL: Systemic infection (sepsis) is possible, but other conditions are known to elevate PCT as well. (NOTE)       Sepsis PCT Algorithm           Lower Respiratory Tract                                       Infection PCT Algorithm    ----------------------------     ----------------------------         PCT < 0.25 ng/mL                PCT < 0.10 ng/mL          Strongly encourage             Strongly discourage   discontinuation of antibiotics    initiation of antibiotics    ----------------------------     -----------------------------       PCT 0.25 - 0.50 ng/mL            PCT 0.10 - 0.25 ng/mL               OR       >80% decrease in PCT            Discourage initiation of                                            antibiotics      Encourage discontinuation           of antibiotics    ----------------------------     -----------------------------         PCT >= 0.50 ng/mL              PCT 0.26 - 0.50 ng/mL                AND       <80% decrease in PCT             Encourage initiation of                                             antibiotics       Encourage continuation           of antibiotics    ----------------------------     -----------------------------  PCT >= 0.50 ng/mL                  PCT > 0.50 ng/mL               AND         increase in PCT                  Strongly encourage                                      initiation of antibiotics    Strongly encourage escalation           of antibiotics                                     -----------------------------                                           PCT <= 0.25 ng/mL                                                 OR                                        > 80% decrease in PCT                                      Discontinue / Do not initiate                                             antibiotics  Performed at [facility], [address] [place], [place] [address]   Urinalysis, Routine w reflex microscopic Urine, Clean Catch     Status: Abnormal   Collection Time: 04/19/20 11:50 AM  Result Value Ref Range   Color, Urine STRAW (A) YELLOW   APPearance CLEAR CLEAR   Specific Gravity, Urine  1.006 1.005 - 1.030   pH 7.0 5.0 - 8.0   Glucose, UA 150 (A) NEGATIVE mg/dL   Hgb urine dipstick NEGATIVE NEGATIVE   Bilirubin Urine NEGATIVE NEGATIVE   Ketones, ur NEGATIVE NEGATIVE mg/dL   Protein, ur NEGATIVE NEGATIVE mg/dL   Nitrite NEGATIVE NEGATIVE   Leukocytes,Ua NEGATIVE NEGATIVE    Comment: Performed at [facility] [address] [place], [name] [address]  Urine culture     Status: None   Collection Time: 04/19/20 11:50 AM   Specimen: In/Out Cath Urine  Result Value Ref Range   Specimen Description IN/OUT CATH URINE    Special Requests NONE    Culture      NO GROWTH Performed at [facility], [place] [address] [place], [place] [address]    Report Status 04/20/2020 FINAL   Lactic acid, plasma  Status: Abnormal   Collection Time: 04/19/20  3:46 PM  Result Value Ref Range   Lactic Acid, Venous 3.0 (HH) 0.5 - 1.9 mmol/L    Comment: CRITICAL VALUE NOTED.  VALUE IS CONSISTENT WITH PREVIOUSLY REPORTED AND CALLED VALUE. Performed at [facility], [place] [address], [place], [place] [address]   TSH     Status: Abnormal   Collection Time: 04/19/20  3:46 PM  Result Value Ref Range   TSH 0.325 (L) 0.350 - 4.500 uIU/mL    Comment: Performed by a 3rd Generation assay with a functional sensitivity of <=0.01 uIU/mL. Performed at [facility], [place] [address], [place], [place] [address]   Glucose, capillary     Status: Abnormal   Collection Time: 04/19/20  4:08 PM  Result Value Ref Range   Glucose-Capillary 286 (H) 70 - 99 mg/dL    Comment: Glucose reference range applies only to samples taken after fasting for at least 8 hours.   Comment 1 Notify RN    Comment 2 Document in Chart   Glucose, capillary     Status: Abnormal   Collection Time: 04/19/20  9:12 PM  Result Value Ref Range   Glucose-Capillary 215 (H) 70 - 99 mg/dL    Comment: Glucose reference range applies only to samples taken after fasting for at least 8 hours.   Comment 1 Notify RN     Comment 2 Document in Chart   CBC with Differential/Platelet     Status: Abnormal   Collection Time: 04/20/20  3:29 AM  Result Value Ref Range   WBC 17.2 (H) 4.0 - 10.5 K/uL   RBC 4.10 (L) 4.22 - 5.81 MIL/uL   Hemoglobin 11.7 (L) 13.0 - 17.0 g/dL   HCT 37.8 (L) 39.0 - 52.0 %   MCV 92.2 80.0 - 100.0 fL   MCH 28.5 26.0 - 34.0 pg   MCHC 31.0 30.0 - 36.0 g/dL   RDW 14.2 11.5 - 15.5 %   Platelets 254 150 - 400 K/uL   nRBC 0.0 0.0 - 0.2 %   Neutrophils Relative % 94 %   Neutro Abs 16.2 (H) 1.7 - 7.7 K/uL   Lymphocytes Relative 3 %   Lymphs Abs 0.5 (L) 0.7 - 4.0 K/uL   Monocytes Relative 2 %   Monocytes Absolute 0.4 0.1 - 1.0 K/uL   Eosinophils Relative 0 %   Eosinophils Absolute 0.0 0.0 - 0.5 K/uL   Basophils Relative 0 %   Basophils Absolute 0.0 0.0 - 0.1 K/uL   Immature Granulocytes 1 %   Abs Immature Granulocytes 0.09 (H) 0.00 - 0.07 K/uL    Comment: Performed at [facility], [address] [place], [place] [address]  Basic metabolic panel     Status: Abnormal   Collection Time: 04/20/20  3:29 AM  Result Value Ref Range   Sodium 140 135 - 145 mmol/L   Potassium 4.2 3.5 - 5.1 mmol/L   Chloride 105 98 - 111 mmol/L   CO2 30 22 - 32 mmol/L   Glucose, Bld 201 (H) 70 - 99 mg/dL    Comment: Glucose reference range applies only to samples taken after fasting for at least 8 hours.   BUN 16 6 - 20 mg/dL   Creatinine, Ser 0.77 0.61 - 1.24 mg/dL   Calcium 8.9 8.9 - 10.3 mg/dL   GFR, Estimated >60 >60 mL/min    Comment: (NOTE) Calculated using the CKD-EPI Creatinine Equation (2021)    Anion  gap 5 5 - 15    Comment: Performed at [facility], [place] [address], [place], [place] [address]  Protime-INR     Status: None   Collection Time: 04/20/20  3:29 AM  Result Value Ref Range   Prothrombin Time 12.9 11.4 - 15.2 seconds   INR 1.0 0.8 - 1.2    Comment: (NOTE) INR goal varies based on device and disease states. Performed at [facility], [place] [address],  [place], [place] [address]   Glucose, capillary     Status: Abnormal   Collection Time: 04/20/20  6:19 AM  Result Value Ref Range   Glucose-Capillary 221 (H) 70 - 99 mg/dL    Comment: Glucose reference range applies only to samples taken after fasting for at least 8 hours.   Comment 1 Notify RN    Comment 2 Document in Chart   Glucose, capillary     Status: Abnormal   Collection Time: 04/20/20 11:27 AM  Result Value Ref Range   Glucose-Capillary 227 (H) 70 - 99 mg/dL    Comment: Glucose reference range applies only to samples taken after fasting for at least 8 hours.   Comment 1 Notify RN    Comment 2 Document in Chart   Glucose, capillary     Status: Abnormal   Collection Time: 04/20/20  4:14 PM  Result Value Ref Range   Glucose-Capillary 245 (H) 70 - 99 mg/dL    Comment: Glucose reference range applies only to samples taken after fasting for at least 8 hours.   Comment 1 Notify RN    Comment 2 Document in Chart   Glucose, capillary     Status: Abnormal   Collection Time: 04/20/20  9:24 PM  Result Value Ref Range   Glucose-Capillary 184 (H) 70 - 99 mg/dL    Comment: Glucose reference range applies only to samples taken after fasting for at least 8 hours.  Troponin I (High Sensitivity)     Status: Abnormal   Collection Time: 04/20/20 11:01 PM  Result Value Ref Range   Troponin I (High Sensitivity) 19 (H) <18 ng/L    Comment: (NOTE) Elevated high sensitivity troponin I (hsTnI) values and significant  changes across serial measurements may suggest ACS but many other  chronic and acute conditions are known to elevate hsTnI results.  Refer to the "Links" section for chest pain algorithms and additional  guidance. Performed at [facility], [place] [address] [place], [place] [address]   Blood gas, arterial     Status: Abnormal   Collection Time: 04/21/20  2:01 AM  Result Value Ref Range   FIO2 36.00    pH, Arterial 7.290 (L) 7.350 - 7.450   pCO2 arterial 54.6 (H) 32.0 - 48.0  mmHg   pO2, Arterial 89.0 83.0 - 108.0 mmHg   Bicarbonate 25.4 20.0 - 28.0 mmol/L   Acid-base deficit 0.4 0.0 - 2.0 mmol/L   O2 Saturation 95.5 %   Patient temperature 37.0    Collection site RIGHT RADIAL    Drawn by [id number]    Sample type ARTERIAL DRAW    Allens test (pass/fail) PASS PASS    Comment: Performed at [facility], [place] [address], [place], [place] [address]  Glucose, capillary     Status: Abnormal   Collection Time: 04/21/20  2:26 AM  Result Value Ref Range   Glucose-Capillary 169 (H) 70 - 99 mg/dL    Comment: Glucose reference range applies only to  samples taken after fasting for at least 8 hours.    CT CHEST WO CONTRAST  Result Date: 04/20/2020 CLINICAL DATA:  Pneumonia in the RIGHT LOWER lobe. Rule out malignancy. EXAM: CT CHEST WITHOUT CONTRAST TECHNIQUE: Multidetector CT imaging of the chest was performed following the standard protocol without IV contrast. COMPARISON:  Portable chest on 04/19/2020 and CT of the chest on 07/05/2019 FINDINGS: Cardiovascular: Heart size normal. No pericardial effusion. Minimal coronary artery calcification. There is atherosclerotic calcification of the thoracic aorta not associated with aneurysm. The noncontrast appearance of the pulmonary arteries is normal. Mediastinum/Nodes: Normal esophagus. No significant mediastinal, hilar, or axillary adenopathy. The visualized portion of the thyroid gland has a normal appearance. Lungs/Pleura: There is panlobular emphysema of the lung apices. Paraseptal emphysema particularly noted in the perihilar regions. Focal Tree-in-bud type opacities are identified in the anterior RIGHT UPPER lobe and RIGHT middle lobe. There is consolidation in the posterior RIGHT LOWER lobe and posterior RIGHT middle lobe, likely infectious. There is no central obstruction. Airways are patent. No suspicious pulmonary nodules. Upper Abdomen: Gallbladder is present. UPPER abdomen is unremarkable. Musculoskeletal: There is mild  anterior compression deformity of T11 and T12, chronic in appearance. IMPRESSION: 1. Consolidative changes in the posterior RIGHT LOWER lobe and posterior RIGHT middle lobe, likely infectious. 2. Tree-in-bud type opacities in the anterior RIGHT UPPER lobe and RIGHT middle lobe, consistent with infectious/inflammatory process. 3. No adenopathy or struck shin to indicate presence of malignancy. 4. Aortic Atherosclerosis (ICD10-I70.0) and Emphysema (ICD10-J43.9). 5. Chronic anterior wedge compression fractures of T11 and T12. Electronically Signed   By: Nolon Nations M.D.   On: 04/20/2020 09:43   DG Chest Portable 1 View  Result Date: 04/19/2020 CLINICAL DATA:  Shortness of breath, respiratory distress. EXAM: PORTABLE CHEST 1 VIEW COMPARISON:  Chest x-rays dated 03/06/2020 and 11/16/2019. FINDINGS: New dense opacity at the RIGHT lung base, suspected pneumonia. No pleural effusion or pneumothorax is seen. Multiple old healed rib fractures bilaterally. No acute appearing osseous abnormality. Heart size and mediastinal contours are stable given the semi-erect patient positioning. IMPRESSION: New dense opacity at the RIGHT lung base, suspected pneumonia. Consider chest CT for confirmation and to exclude the alternative possibility of malignancy. Electronically Signed   By: Franki Cabot M.D.   On: 04/19/2020 07:46    Review of Systems  Reason unable to perform ROS: Active delirium tremens.   Blood pressure (!) 163/94, pulse (!) 119, temperature 99.8 F (37.7 C), temperature source Oral, resp. rate 20, height 5\' 8"  (1.727 m), weight 91.9 kg, SpO2 94 %.  I/O last 3 completed shifts: In: 4990.6 [P.O.:1800; I.V.:1686.7; IV Piggyback:1503.9] Out: 3951 [Urine:3950; Stool:1] Total I/O In: 2.8 [I.V.:2.8] Out: 300 [Urine:300]   Physical Exam Vitals reviewed.  Constitutional:      Appearance: He is obese.     Comments: Agitated, anxious, tremors, diaphorectic  HENT:     Head: Normocephalic and  atraumatic.  Eyes:     Extraocular Movements: Extraocular movements intact.     Pupils: Pupils are equal, round, and reactive to light.  Cardiovascular:     Rate and Rhythm: Regular rhythm. Tachycardia present.  Pulmonary:     Effort: Pulmonary effort is normal. No respiratory distress.     Breath sounds: Normal breath sounds.  Chest:     Chest wall: No deformity.  Abdominal:     General: Bowel sounds are normal.     Palpations: Abdomen is soft.     Tenderness: There is no guarding or rebound.  Musculoskeletal:        General: Normal range of motion.     Right lower leg: No edema.     Left lower leg: No edema.  Skin:    General: Skin is warm and dry.     Findings: No erythema.     Comments: flushed  Neurological:     General: No focal deficit present.     Mental Status: He is alert and oriented to person, place, and time.  Psychiatric:        Mood and Affect: Mood is anxious.        Behavior: Behavior is agitated.     Assessment/Plan: 1. Worsening delirium tremens secondary to complicated alcohol withdrawals Plan: Start Precedex, change Thiamine, Folic, MVI to banana bag since patient is unable to take PO and also needs IVF for his DTs. Ativan prn for seizure activity, nutritional support as able. May benefit from librium in the next 24 hours in order to safely wean off Precedex. Check Magnesium levels.  2. Community acquired pneumonia Plan: continue ABX, if strep Ag is negative for Strep pneumoniae then D/C Vancomycin as patient is MRSA negative. Continue cefepime.  3. Acute exacerbation of COPD Plan: bronchodilators around the clock, decreased solumedrol to 40 mg Q 8 hrs, continue ABX.  4. Type 2 DM with hyperglycemia Plan: target glucose management especial as patient is on steroids.   Drips: Precedex, Banana bag, IVF Lines: PIVs Prophylaxis: Lovenox Restraints: mittens Nutrition: oral once mental status has improved Code: Full  Family: Attempted to update  sisterTeresa of transfer to ICU was unsuccessful.  Thank you for allowing me the privilege to care for this patient.  I have dedicated a total of 55 minutes in critical care time minus all appropriate exclusions.    Lafayette Dragon 04/21/2020, 3:00 AM

## 2020-04-21 NOTE — Progress Notes (Addendum)
Pt presenting with increased restlessness and tremors. Pt now having intermittent confusion and requiring more redirection and reorientation. Vital signs as charted. Pt assisted onto bedpan d/t being too tremulous to get out of bed at this time. Pt administered PRN ativan per CIWA protocol. MD updated on patient condition.

## 2020-04-21 NOTE — Evaluation (Signed)
Occupational Therapy Evaluation Patient Details Name: James [name] MRN: [id number] DOB: April 15, 1960 Today's Date: 04/21/2020    History of Present Illness James [name] is a 60 y.o. male admitted for acute COPD exacerbation and right lower lobe pneumonia. PMH: recurrent pneumonia, COPD O2 and prednisone dependent, chronic respiratory failure on home O2 2-4 LNC, COVID-19 infection in 10/2019, HTN, T2DM, ETOH abuse.   Clinical Impression   Pt PTA: Pt's info from chart as pt not alert enough to answer fully. Per chart, pt has a sister who assists with medications and pt performs own ADL, although pt rarely bathes. Pt was nearly independent with tremors during last admission.  Pt currently,  following few commands initially as pt was very lethargic and eventually, pt was more conversant. Pt closing L eye for most of session and could not explain why. Pt limited by decreased strength, decreased ability to care for self, decreased mobility and decreased alertness. Pt minA to [place] for ADL and modA to maxA for mobility. Pt would greatly benefit from continued OT skilled services. OT following acutely. * Pt more than likely needs ALF as he is in increasingly hospitalized and cannot properly care for self.     Follow Up Recommendations  Supervision/Assistance - 24 hour;SNF (hope to progress to [facility])    Equipment Recommendations  3 in 1 bedside commode    Recommendations for Other Services       Precautions / Restrictions Precautions Precautions: Fall Restrictions Weight Bearing Restrictions: No      Mobility Bed Mobility Overal bed mobility: Needs Assistance Bed Mobility: Supine to Sit     Supine to sit: Mod assist;+2 for physical assistance     General bed mobility comments: Pt needed mod assist due to posterior lean with use of bed pads to scoot pt forward    Transfers Overall transfer level: Needs assistance Equipment used: 2 person hand held assist Transfers: Sit to/from  Stand Sit to Stand: Mod assist;+2 physical assistance;From elevated surface;Max assist         General transfer comment: Pt standing at EOB and needing to urinate, but primo fit malfunctioned. Pt standing for x2 mins for task, but unable to safely take steps to [facility].    Balance Overall balance assessment: Needs assistance Sitting-balance support: No upper extremity supported;Feet supported;Bilateral upper extremity supported Sitting balance-Leahy Scale: Poor Sitting balance - Comments: Min guard to mod to sit EOB as pt with periods where he could sit but would thne have posterio rlean and need assist. Postural control: Posterior lean Standing balance support: Bilateral upper extremity supported;During functional activity Standing balance-Leahy Scale: Poor Standing balance comment: Needed heavy +2 mod to max assist initially to support pt with HHA of 2 as well.                           ADL either performed or assessed with clinical judgement   ADL Overall ADL's : Needs assistance/impaired Eating/Feeding: Set up;Sitting   Grooming: Minimal assistance;Sitting   Upper Body Bathing: Minimal assistance;Sitting   Lower Body Bathing: Moderate assistance;Sitting/lateral leans;Sit to/from stand   Upper Body Dressing : Minimal assistance;Sitting   Lower Body Dressing: Moderate assistance;Sitting/lateral leans;Sit to/from stand   Toilet Transfer: Moderate assistance;+2 for physical assistance;+2 for safety/equipment   Toileting- Clothing Manipulation and Hygiene: Total assistance;+2 for physical assistance;Sitting/lateral lean       Functional mobility during ADLs: Maximal assistance;+2 for physical assistance;+2 for safety/equipment;Rolling walker;Cueing for safety General ADL Comments:  Pt limited by decreased strength, decreased ability to care for self, decreased mobility and decreased alertness. Pt following most commands once more alert.     Vision Baseline  Vision/History: No visual deficits Patient Visual Report: No change from baseline Vision Assessment?: No apparent visual deficits     Perception     Praxis      Pertinent Vitals/Pain Pain Assessment: No/denies pain     Hand Dominance Right   Extremity/Trunk Assessment Upper Extremity Assessment Upper Extremity Assessment: Generalized weakness;RUE deficits/detail;LUE deficits/detail RUE Deficits / Details: edema LUE Deficits / Details: edema   Lower Extremity Assessment Lower Extremity Assessment: Generalized weakness RLE Coordination: decreased gross motor LLE Coordination: decreased gross motor   Cervical / Trunk Assessment Cervical / Trunk Assessment: Normal   Communication Communication Communication: HOH   Cognition Arousal/Alertness: Lethargic;Suspect due to medications Behavior During Therapy: Flat affect;Anxious Overall Cognitive Status: Impaired/Different from baseline Area of Impairment: Orientation;Memory;Following commands;Safety/judgement;Problem solving;Awareness                 Orientation Level: Disoriented to;Place;Time;Situation   Memory: Decreased short-term memory Following Commands: Follows one step commands inconsistently;Follows one step commands with increased time Safety/Judgement: Decreased awareness of safety   Problem Solving: Slow processing;Decreased initiation;Difficulty sequencing;Requires verbal cues;Requires tactile cues General Comments: Pt following few commands initially as pt was very lethargic and eventually, pt was more conversant. Pt closing L eye for most of session and could not explain why.   General Comments  VSS on 2L O2    Exercises     Shoulder Instructions      Home Living Family/patient expects to be discharged to:: Private residence Living Arrangements: Alone Available Help at Discharge: Family;Available PRN/intermittently Type of Home: House Home Access: Stairs to enter     Home Layout: One level      Bathroom Shower/Tub: Teacher, early years/pre: Standard Bathroom Accessibility: Yes   Home Equipment: None          Prior Functioning/Environment Level of Independence: Independent        Comments: drives, manages his household; confirms that sister still assists with medications        OT Problem List: Decreased strength;Decreased activity tolerance;Impaired balance (sitting and/or standing);Decreased safety awareness;Increased edema;Pain;Cardiopulmonary status limiting activity;Decreased coordination;Decreased cognition      OT Treatment/Interventions: Self-care/ADL training;Therapeutic exercise;Energy conservation;DME and/or AE instruction;Therapeutic activities;Patient/family education;Balance training;Cognitive remediation/compensation    OT Goals(Current goals can be found in the care plan section) Acute Rehab OT Goals Patient Stated Goal: unable to state OT Goal Formulation: Patient unable to participate in goal setting Time For Goal Achievement: 05/05/20 Potential to Achieve Goals: Good ADL Goals Pt Will Perform Grooming: with supervision;standing Pt Will Transfer to Toilet: with min guard assist;ambulating Pt Will Perform Toileting - Clothing Manipulation and hygiene: with min guard assist;sit to/from stand Pt/caregiver will Perform Home Exercise Program: Increased strength;Both right and left upper extremity;With written HEP provided Additional ADL Goal #1: Pt will follow 100% of 2 step commands in order to continue to assess cognition and carry over skills.  OT Frequency: Min 2X/week   Barriers to D/C:            Co-evaluation PT/OT/SLP Co-Evaluation/Treatment: Yes Reason for Co-Treatment: Complexity of the patient's impairments (multi-system involvement);Necessary to address cognition/behavior during functional activity PT goals addressed during session: Mobility/safety with mobility OT goals addressed during session: ADL's and self-care       AM-PAC OT "6 Clicks" Daily Activity     Outcome Measure Help from  another person eating meals?: A Little Help from another person taking care of personal grooming?: A Little Help from another person toileting, which includes using toliet, bedpan, or urinal?: Total Help from another person bathing (including washing, rinsing, drying)?: A Lot Help from another person to put on and taking off regular upper body clothing?: A Lot Help from another person to put on and taking off regular lower body clothing?: A Lot 6 Click Score: 13   End of Session Equipment Utilized During Treatment: Gait belt;Oxygen Nurse Communication: Mobility status  Activity Tolerance: Patient tolerated treatment well Patient left: in bed;with call bell/phone within reach;with bed alarm set  OT Visit Diagnosis: Unsteadiness on feet (R26.81);Muscle weakness (generalized) (M62.81);Pain Pain - part of body:  (generalized)                Time: 4975-3005 OT Time Calculation (min): 38 min Charges:  OT General Charges $OT Visit: 1 Visit OT Evaluation $OT Eval Moderate Complexity: 1 Mod  Jefferey Pica, OTR/L Acute Rehabilitation Services Pager: [phone] Office: [phone]   Reagyn Facemire C 04/21/2020, 5:34 PM

## 2020-04-21 NOTE — Progress Notes (Signed)
Pt has received 16 mg ativan since 2100. Pt HR sustaining 130s-140s BP 188/111 Pt with increased WOB and increased agitation. Ativan seemingly not having any effect. Pt wheezing and unable to be redirected. Oriented to self only at this time. MD aware.

## 2020-04-21 NOTE — Consult Note (Signed)
Palliative Medicine Inpatient Consult Note  Reason for consult:  "goal of care , counselling and coordinating"  HPI:  Per intake H&P --> James [name] is a 60 y.o. male with medical history significant of recurrent pneumonia, COPD O2 and prednisone dependent, chronic respiratory failure on home O2 2-4 LNC, COVID-19 infection in 10/2019, HTN, T2DM.   Patient admitted for acute COPD exacerbation and right lower lobe pneumonia.  He is on antibiotics, aggressive bronchodilator therapy, IV steroids, inhalation steroids.  Also has a history of alcohol use (drinks 4 beers every night) and was placed on CIWA protocol on admission due to tremulousness. Overnight patient developed severe alcohol withdrawal as anticipated not enough to control with IV Ativan doses and needed transfer to ICU. Currently on Precedex and with better symptom control.  Palliative care was asked to get involved to further aid in goals of care conversations in the setting of COPD  Clinical Assessment/Goals of Care:  *Please note that this is a verbal dictation therefore any spelling or grammatical errors are due to the "[place] [employer]" system interpretation.  I have reviewed medical records including EPIC notes, labs and imaging, received report from bedside RN, assessed the patient who is somnolent on precedex gtt.    I called patients sister, James [name] to further discuss diagnosis prognosis, GOC, EOL wishes, disposition and options.   I introduced Palliative Medicine as specialized medical care for people living with serious illness. It focuses on providing relief from the symptoms and stress of a serious illness. The goal is to improve quality of life for both the patient and the family.  James [name] shares with me that [name] lives in [place], [place].  He is divorced.  He has 3 children all of whom he is estranged from due to chronic drug abuse and alcohol abuse.  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  He gets enjoyment out of saving money.  He is [employer] though his strength in these believes fluctuate.  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  She shares that he "gets no enjoyment out of life any longer".  James [name] expresses that her parents were both alcoholics and James [name] has followed in their footsteps.  In regard to other recreational drugs [name] has used crack cocaine, methamphetamines, and heroin.  As of 1 year ago he was still using methamphetamine.  James [name] shares with me that she thought he had stopped drinking though it is obvious through reviewing his chart that his delirium and tremors are much worse than she had thought which leads her to believe he has remained to drink heavily.  James [name] does live independently though per James [name] his living conditions are extremely poor.  His home is a mess and he himself barely bathes.  In addition to this James [name] has no heat and utilizes a small diesel fuel heater which is insufficient to properly heat his home.  James [name] feels strongly that James [name] is not capable of caring for himself properly.  She has told him in the past and remains to feel strongly that he needs placement at a facility which can cater to his many needs.  James [name] is able to eat as he has food stamps.  From a nutritional perspective he does not seem to have been lacking.  A detailed discussion was had today regarding advanced directives -it is noted that [name] does have in [facility] documentation.  Concepts specific to code status, artifical feeding and hydration, continued IV antibiotics and rehospitalization was had.    James [name] shares that she would like James [name] to make this decision for himself as at this point in time she is not comfortable doing it.  She is hopeful that he will become more alert over the next few days and be able to further participate in these important  conversations.  The difference between a aggressive medical intervention path  and a palliative comfort care path for this patient at this time was had.  Ayesha Rumpf is well aware that if James [name] does not change his lifestyle he will not be long for this earth.  This is something that she is talked to him on a multitude of occasions about so she shares with me that he is wildly noncompliant and has a tendency to not listen to her.  I asked James [name] if setting up outpatient palliative care may be of utility to continue these very important conversations which she was in agreement with.  Discussed the importance of continued conversation with family and their  medical providers regarding overall plan of care and treatment options, ensuring decisions are within the context of the patients values and GOCs.  Decision Maker: James [name] (sister) [phone]  SUMMARY OF RECOMMENDATIONS   Full Code -this topic will be further broached when patient is more coherent.  At this time Nam sister would like him to be the decision maker.  Ongoing goals of care conversations with [facility] when he is more alert  Will request TOC set [name] up with OP Palliative support  Will request social work team aid in supporting patient regarding his poor living conditions. Patients sister believes patient needs a longer term living solution where he can have care needs met though Keagen has been resistant to this in the past  Ongoing incremental support  Code Status/Advance Care Planning: FULL CODE    Palliative Prophylaxis:   Oral care, mobility, delirium  Additional Recommendations (Limitations, Scope, Preferences):  Continue current scope of care  Psycho-social/Spiritual:   Desire for further Chaplaincy support: No  Additional Recommendations: Education on chronic disease conditions   Prognosis: Multiple co-morbidities and noncompliance in addition to polysubstance abuse. Patient has a high risk of mortality  w/in 12 months due to these factors.  Discharge Planning: Uncertain at this time  Vitals:   04/21/20 0800 04/21/20 0900  BP: 136/73 (!) 149/86  Pulse: 78 80  Resp: 16 18  Temp: 98.2 F (36.8 C)   SpO2: 92% 94%    Intake/Output Summary (Last 24 hours) at 04/21/2020 4967 Last data filed at 04/21/2020 0547 Gross per 24 hour  Intake 2277.87 ml  Output 2401 ml  Net -123.13 ml   Last Weight  Most recent update: 04/21/2020  2:24 AM   Weight  91.9 kg (202 lb 9.6 oz)           Gen:  Chronically ill appearing M HEENT: moist mucous membranes CV: Regular rate and rhythm  PULM: On 2LPM [place] ABD: soft/nontender  EXT: No edema  Neuro: Somnolent on sedation  PPS: 20%   This conversation/these recommendations were discussed with patient primary care team, Dr. [name] Fulling  Time In: 0800 Time Out: 0910 Total Time: 70 Greater than 50%  of this time was spent counseling and coordinating care related to the above assessment and plan.  [facility] Team Team Cell Phone: [phone] Please utilize secure chat with additional questions, if there is no  response within 30 minutes please call the above phone number  Palliative Medicine Team providers are available by phone from 7am to 7pm daily and can be reached through the team cell phone.  Should this patient require assistance outside of these hours, please call the patient's attending physician.

## 2020-04-21 NOTE — Progress Notes (Addendum)
Pt now only oriented to self. He presents with non-sensical speech and inappropriate words. He is clearly hallucinating and talking about things in the room that are not present. He is unable to be redirected. He is impulsive, continuously attempting to get out of bed and increasingly agitated. He is beginning to work harder to breath and is wheezing at times. His face is bright red, he is very tense and his tremors continue to be severe. Unable to obtain accurate BP at this time d/t pt inability to remain still. Last BP at 0000 = 163/94. Pt tachycardic = 106 and tachypneic. 4mg  PRN ativan administered at this time per CIWA protocol. MD updated on patient condition.

## 2020-04-21 NOTE — Significant Event (Addendum)
Rapid Response Event Note   Reason for Call :  DT's/Agitation  Initial Focused Assessment:  Alert and oriented to only self Tachy 120-130's, tacypneic 30's, hypertensive 180 SBP, restless and has tremors Will not stay in the bed Wheezing although 100 on [facility] Diminished lung sounds throughout all lung fields 16mg  Ativan since 2107  Interventions:  Ordered ABG/CXR, Vital signs and auscultated lung sounds Speak with TR[facility] about moving to ICU for precedex Plan of Care:  Transferred patient to 3M09 for precedex   Event Summary:   MD Notified: Williams Call Time: 0148 Arrival Time: 0150 End Time: Wallis Mart, RN

## 2020-04-21 NOTE — Progress Notes (Signed)
Patient resting comfortably on 2L [place]. No respiratory distress noted. BIPAP not needed at this time. RT will monitor as needed.

## 2020-04-21 NOTE — Progress Notes (Signed)
Assisted tele visit to patient with family member.  Siarra Gilkerson P, RN  

## 2020-04-22 ENCOUNTER — Inpatient Hospital Stay (HOSPITAL_COMMUNITY): Payer: Medicaid Other

## 2020-04-22 LAB — GLUCOSE, CAPILLARY
Glucose-Capillary: 162 mg/dL — ABNORMAL HIGH (ref 70–99)
Glucose-Capillary: 213 mg/dL — ABNORMAL HIGH (ref 70–99)
Glucose-Capillary: 166 mg/dL — ABNORMAL HIGH (ref 70–99)
Glucose-Capillary: 143 mg/dL — ABNORMAL HIGH (ref 70–99)
Glucose-Capillary: 181 mg/dL — ABNORMAL HIGH (ref 70–99)

## 2020-04-22 LAB — CBC
HCT: 39.3 % (ref 39.0–52.0)
Hemoglobin: 12.3 g/dL — ABNORMAL LOW (ref 13.0–17.0)
MCH: 28.8 pg (ref 26.0–34.0)
MCHC: 31.3 g/dL (ref 30.0–36.0)
MCV: 92 fL (ref 80.0–100.0)
Platelets: 261 10*3/uL (ref 150–400)
RBC: 4.27 MIL/uL (ref 4.22–5.81)
RDW: 14.6 % (ref 11.5–15.5)
WBC: 12.3 10*3/uL — ABNORMAL HIGH (ref 4.0–10.5)
nRBC: 0 % (ref 0.0–0.2)

## 2020-04-22 MED ORDER — METHYLPREDNISOLONE SODIUM SUCC 40 MG IJ SOLR: 40.0000 mg | Freq: Two times a day (BID) | INTRAMUSCULAR | Status: DC

## 2020-04-22 MED ORDER — METHYLPREDNISOLONE SODIUM SUCC 40 MG IJ SOLR
40.0000 mg | Freq: Two times a day (BID) | INTRAMUSCULAR | Status: AC
  Administered 2020-04-22: 40 mg via INTRAVENOUS
  Filled 2020-04-22: qty 1

## 2020-04-22 MED ORDER — PREDNISONE 20 MG PO TABS
40.0000 mg | ORAL_TABLET | Freq: Every day | ORAL | Status: DC
  Administered 2020-04-23 – 2020-04-24 (×2): 40 mg via ORAL
  Filled 2020-04-22 (×2): qty 2

## 2020-04-22 NOTE — Plan of Care (Signed)
  Problem: Clinical Measurements: Goal: Respiratory complications will improve Outcome: Progressing Note: Pt is tolerating his home O2 of 2L well. He gets dyspnea with exertion, but patient says this is baseline as well.   Problem: Activity: Goal: Risk for activity intolerance will decrease Outcome: Progressing Note: Now that patient is oriented and appropriate, will work on mobilizing patient today.   Problem: Nutrition: Goal: Adequate nutrition will be maintained Outcome: Progressing Note: Patient passed barium swallow eval and tolerated his breakfast well.   Problem: Coping: Goal: Level of anxiety will decrease Outcome: Progressing   Problem: Pain Managment: Goal: General experience of comfort will improve Outcome: Progressing Note: Patient is not experiencing pain at this time.   Problem: Safety: Goal: Ability to remain free from injury will improve Outcome: Progressing   Problem: Skin Integrity: Goal: Risk for impaired skin integrity will decrease Outcome: Progressing

## 2020-04-22 NOTE — TOC Initial Note (Addendum)
Transition of Care [facility]) - Initial/Assessment Note    Patient Details  Name: James [name] MRN: [id number] Date of Birth: [birth year]  Transition of Care [facility]) CM/SW Contact:    Bartholomew Crews, RN Phone Number: [phone] 04/22/2020, 4:41 PM  Clinical Narrative:                  Spoke with patient at the bedside. Patient just transferred out of ICU to progressive. PTA home alone. Hs no pets but stated that he would like a pet some day. Patient verified his demographic information - stated that his number is [phone], but the house number is his sister, James [name]'s number.   Has home oxygen - unable to provide oxygen provider. No other DME in the home.   Has [facility] case manager - Reached out to [employer] via secure chat to discuss transition needs. Threasa Beards was able to provide home oxygen provider - [employer].  Discussed recent hospitalization. Patient would like to stay out of the hospital, but stated that his lungs are bad.   Verified PCP. Stated that he has been seeing same PCP for 3 years, and that he is the best.   Stated that he drives himself to medical appointments. Advised of transportation benefit if he is unable to drive.   Patient declines need for PCS services. Stated that he has no problem performing ADLs. He stated that he will consider HH PT.   Stated that his sister, James [name], will pick him up at discharge. She will bring his home oxygen.   Discussed recommendation for outpatient palliative. Patient agreeable. Referral to [facility] for follow up post discharge.   TOC following for transition needs.   Expected Discharge Plan: [place] Barriers to Discharge: Continued Medical Work up   Patient Goals and CMS Choice Patient states their goals for this hospitalization and ongoing recovery are:: return home CMS Medicare.gov Compare Post Acute Care list provided to:: Patient Choice offered to / list presented to : Patient  Expected Discharge Plan and  Services Expected Discharge Plan: [place] In-house Referral: Clinical Social Work Discharge Planning Services: CM Consult Post Acute Care Choice: [place] arrangements for the past 2 months: Single Family Home                 DME Arranged: N/A DME Agency: NA                  Prior Living Arrangements/Services Living arrangements for the past 2 months: Single Family Home Lives with:: Self Patient language and need for interpreter reviewed:: Yes Do you feel safe going back to the place where you live?: Yes      Need for Family Participation in Patient Care: No (Comment)   Current home services: DME ([employer] for home oxygen) Criminal Activity/Legal Involvement Pertinent to Current Situation/Hospitalization: No - Comment as needed  Activities of Daily Living Home Assistive Devices/Equipment: Oxygen ADL Screening (condition at time of admission) Patient's cognitive ability adequate to safely complete daily activities?: Yes Is the patient deaf or have difficulty hearing?: No Does the patient have difficulty seeing, even when wearing glasses/contacts?: No Does the patient have difficulty concentrating, remembering, or making decisions?: No Patient able to express need for assistance with ADLs?: Yes Does the patient have difficulty dressing or bathing?: No Independently performs ADLs?: Yes (appropriate for developmental age) Does the patient have difficulty walking or climbing stairs?: No Weakness of Legs: None Weakness of Arms/Hands: None  Permission Sought/Granted                  Emotional Assessment Appearance:: Appears stated age Attitude/Demeanor/Rapport: Engaged Affect (typically observed): Flat,Accepting Orientation: : Oriented to Self,Oriented to  Time,Oriented to Place,Oriented to Situation Alcohol / Substance Use: Alcohol Use Psych Involvement: No (comment)  Admission diagnosis:  Sepsis ([place]) [A41.9] Severe sepsis with acute  organ dysfunction ([place]) [A41.9, R65.20] Pneumonia of right lower lobe due to infectious organism [J18.9] Patient Active Problem List   Diagnosis Date Noted  . Alcohol withdrawal syndrome, with delirium ([place])   . Sepsis ([place]) 04/19/2020  . Lactic acid acidosis 04/19/2020  . HCAP (healthcare-associated pneumonia) 04/19/2020  . Obesity (BMI 30.0-34.9) 04/19/2020  . COPD with acute exacerbation ([place]) 03/26/2020  . Pain of right forearm 12/03/2019  . Bronchiectasis ([place]) 09/24/2019  . Type 2 diabetes mellitus without complication, without long-term current use of insulin ([place]) 07/24/2019  . Other atopic dermatitis 05/01/2019  . Chronic respiratory failure with hypoxia ([place]) 04/23/2019  . Intellectual disability 04/23/2019  . History of alcohol use 11/23/2018  . Hypertension 05/17/2016  . History of tobacco abuse 04/06/2016  . COPD mixed type ([place]) 03/26/2016   PCP:  [name], MD Pharmacy:   [facility] [address] [place] [address] Phone: [phone] Fax: [place] [address] [place] [address] Phone: [phone] Fax: [phone]  [facility] - [place], [place] - [address] [place] [address] Phone: [phone] Fax: [phone]     Social Determinants of Health (SDOH) Interventions    Readmission Risk Interventions Readmission Risk Prevention Plan 03/28/2020 04/26/2019  Transportation Screening Complete Complete  PCP or Specialist Appt within 3-5 Days Not Complete Complete  Not Complete comments Pt had existing appt with [name] on 04/14/20. -  [place] or [place] Complete Complete  Social Work Consult for [place] Planning/Counseling Complete Complete  Palliative Care Screening Not Applicable Not Applicable  Medication Review (RN Care Manager) - Complete  Some recent data might be  hidden

## 2020-04-22 NOTE — Progress Notes (Addendum)
NAME:  James [name], MRN:  [id number], DOB:  November 18, 1960, LOS: 3 ADMISSION DATE:  04/19/2020, CONSULTATION DATE:  4/17 REFERRING MD:  EDP, CHIEF COMPLAINT:  AMS   History of Present Illness:   60 year old male with COPD and chronic hypoxia on 2-4 liters of oxygen, recurrent pneumonia and hospital admissions, COVID-19 in 10/2019, HTN, diabetes type 2 presented with sudden onset worsening shortness of breath , cough , wheezing, wheezingfor 1 day. Patient has been recently hospitalized in March 2022 with COPD exacerbation and pneumonia. Discharged on prednisone taper and currently taking prednisone 10 mg twice daily. Patient is on poor health, he continues to drink alcohol daily. In the emergency room, temperature 102. Chest x-ray consistent with right lower lobe pneumonia. Admitted by [facility], started on abx. Initially needed bipap for respiratory support but improved.   Had worsening DT's on 4/18 requiring ICU tx and precedex gtt.   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   Significant Hospital Events: Including procedures, antibiotic start and stop dates in addition to other pertinent events   . Cefepime 4/16>> . vanc 4/16>>>4/19  Interim History / Subjective:  Much improved this am  Off precedex, passed swallow eval. Sitting up eating breakfast  No c/o  Objective   Blood pressure (!) 147/98, pulse (!) 109, temperature 98.5 F (36.9 C), temperature source Oral, resp. rate (!) 24, height 5\' 8"  (1.727 m), weight 91.9 kg, SpO2 90 %.         Intake/Output Summary (Last 24 hours) at 04/22/2020 0928 Last data filed at 04/22/2020 0900 Gross per 24 hour  Intake 2185.44 ml  Output 1350 ml  Net 835.44 ml   Filed Weights   04/19/20 1327 04/20/20 0325 04/21/20 0200  Weight: 90.8 kg 90.5 kg 91.9 kg    Examination: General: chronically ill appearing male, NAD  HENT: mm dry, no JVD  Lungs: resps even non labored on 2L , clear  Cardiovascular: s1s2 rrr Abdomen: soft, non tender  Extremities: warm and dry, no edema  Neuro: awake, alert, eating, appropriate, MAE  Labs/imaging that I havepersonally reviewed  (right click and "Reselect all SmartList Selections" daily)  CBC  Resolved Hospital Problem list     Assessment & Plan:  AMS ETOH withdrawal - improved PLAN -  CIWA protocol  Continue librium  Thiamine, folate, MVI  HCAP  PLAN -  Supplemental O2 as needed  Abx as above - d/c vanc. Likely narrow to PO in next 24 hrs Follow cultures - neg to date  F/u CXR  Pulmonary hygiene as able   ACEOPD  PLAN -  Continue solumedrol - transition to PO prednisone in am Cont abx as above  Continue BD's   DM2  PLAN -  SSI   Hx HTN  PLAN -  continue home losartan, metoprolol   Can tx out of ICU, will ask TRH to assume care in am 4/20   Best practice (right click and "Reselect all SmartList Selections" daily)  Diet:  Oral Pain/Anxiety/Delirium protocol (if indicated): No VAP protocol (if indicated): Not indicated DVT prophylaxis: LMWH GI prophylaxis: N/A Glucose control:  SSI Yes Central venous access:  N/A Arterial line:  N/A Foley:  N/A Mobility:  OOB  PT consulted: N/A Last date of multidisciplinary goals of care discussion []  Code Status:  full code Disposition: ICU  Labs   CBC: Recent Labs  Lab 04/19/20 0705 04/19/20 0710 04/19/20 0904 04/20/20 0329 04/21/20 0325 04/22/20 0139  WBC 18.8*  --   --  17.2* 17.0* 12.3*  NEUTROABS 14.9*  --   --  16.2* 15.8*  --   HGB 14.1 15.6 12.6* 11.7*  11.3* 12.3*  HCT 44.8 46.0 37.0* 37.8* 36.7* 39.3  MCV 93.1  --   --  92.2 93.9 92.0  PLT 341  --   --  254 284 025    Basic Metabolic Panel: Recent Labs  Lab 04/19/20 0705 04/19/20 0710 04/19/20 0904 04/20/20 0329 04/21/20 0325  NA 143 141 140 140 140  K 3.9 3.8 4.0 4.2 4.9  CL 103 101  --  105 105  CO2 29  --   --  30 28  GLUCOSE 134* 131*  --  201* 157*  BUN 14 18  --  16 23*  CREATININE 0.99 0.90  --  0.77 0.87  CALCIUM 9.5  --   --  8.9 9.3  MG  --   --   --   --  2.3  PHOS  --   --   --   --  3.4   GFR: Estimated Creatinine Clearance: 101.8 mL/min (by C-G formula based on SCr of 0.87 mg/dL). Recent Labs  Lab 04/19/20 0705 04/19/20 0918 04/19/20 1546 04/20/20 0329 04/21/20 0325 04/22/20 0139  PROCALCITON  --  0.67  --   --   --   --   WBC 18.8*  --   --  17.2* 17.0* 12.3*  LATICACIDVEN 2.6* 2.3* 3.0*  --   --   --     Liver Function Tests: Recent Labs  Lab 04/19/20 0705  AST 31  ALT 29  ALKPHOS 43  BILITOT 0.6  PROT 6.5  ALBUMIN 4.0   No results for input(s): LIPASE, AMYLASE in the last 168 hours. No results for input(s): AMMONIA in the last 168 hours.  ABG    Component Value Date/Time   PHART 7.290 (L) 04/21/2020 0201   PCO2ART 54.6 (H) 04/21/2020 0201   PO2ART 89.0 04/21/2020 0201   HCO3 25.4 04/21/2020 0201   TCO2 32 04/19/2020 0904   ACIDBASEDEF 0.4 04/21/2020 0201   O2SAT 95.5 04/21/2020 0201     Coagulation Profile: Recent Labs  Lab 04/19/20 0705 04/20/20 0329  INR 0.9 1.0    Cardiac Enzymes: No results for input(s): CKTOTAL, CKMB, CKMBINDEX, TROPONINI in the last 168 hours.  HbA1C: HbA1c, POC (prediabetic range)  Date/Time Value Ref Range Status  02/13/2020 09:15 AM 6.3 5.7 - 6.4 % Final   Hgb A1c MFr Bld  Date/Time Value Ref Range Status  03/26/2020 08:40 AM 6.7 (H) 4.8 - 5.6 % Final    Comment:    (NOTE) Pre diabetes:          5.7%-6.4%  Diabetes:              >6.4%  Glycemic control for   <7.0% adults with  diabetes   10/24/2019 02:08 AM 6.8 (H) 4.8 - 5.6 % Final    Comment:    (NOTE)         Prediabetes: 5.7 - 6.4         Diabetes: >6.4         Glycemic control for  adults with diabetes: <7.0     CBG: Recent Labs  Lab 04/21/20 0813 04/21/20 1228 04/21/20 1744 04/22/20 0309 04/22/20 [place], NP Pulmonary/Critical Care Medicine  04/22/2020  9:28 AM

## 2020-04-22 NOTE — Consult Note (Signed)
   [facility] Inpatient Consult   04/22/2020  REBEL WILLCUTT 06-24-1960 [id number]  Managed Medicaid: [employer]  Patient is active with [facility] Managed Medicaid team.  Reviewed for post hospital follow up needs.  Reviewed notes of  inpatient [facility] RNCM and she has contacted his [facility] Managed Medicaid nurse.  Plan:  No other needs noted.  Natividad Brood, RN BSN [place] [facility] Liaison  [phone] business mobile phone Toll free office [phone]  Fax number: [phone] [email or web address]

## 2020-04-22 NOTE — Progress Notes (Signed)
Modified Barium Swallow Progress Note  Patient Details  Name: James [name] MRN: [id number] Date of Birth: 1960-05-28  Today's Date: 04/22/2020  Modified Barium Swallow completed.  Full report located under Chart Review in the Imaging Section.  Brief recommendations include the following:  Clinical Impression  Pt demonstrates no dysphagia or aspiration during assessment. He was very fatigued and short of breath and also consumed liquids at a rapid consecutive rate by straw. He was much more discoordinated with cup sip due to fear of spilling from tremor. SLP gave large quantities to solids dur to pt report of occasional regurgitation, but these were tolerated well and esophageal sweep with pills also appeared WNL. Will f/u to observe pt with meal given his report, but pt may continue a regular diet and thin liquids with basic precautions.   Swallow Evaluation Recommendations       SLP Diet Recommendations: Regular solids;Thin liquid   Liquid Administration via: Cup;Straw   Medication Administration: Whole meds with liquid   Supervision: Patient able to self feed   Compensations: Slow rate   Postural Changes: Seated upright at 90 degrees          Herbie Baltimore, MA CCC-SLP  Acute Rehabilitation Services Pager [phone] Office [phone]   Lynann Beaver 04/22/2020,8:28 AM

## 2020-04-22 NOTE — Progress Notes (Signed)
Pharmacy Antibiotic Note  James [name] is a 60 y.o. male admitted on 04/19/2020 with pneumonia.  Pharmacy was consulted for vancomycin and cefepime dosing, now only on cefepime.  WBC down to 12.3, afebrile. Blood cultures with no growth to date, urine culture with no growth. CXR yesterday with resolution of RLL opacities.    Plan: Continue Cefepime 2g q8h F/u transition to PO, monitor cultures, renal function  Height: 5\' 8"  (172.7 cm) Weight: 91.9 kg (202 lb 9.6 oz) IBW/kg (Calculated) : 68.4  Temp (24hrs), Avg:98.1 F (36.7 C), Min:97.8 F (36.6 C), Max:98.5 F (36.9 C)  Recent Labs  Lab 04/19/20 0705 04/19/20 0710 04/19/20 0918 04/19/20 1546 04/20/20 0329 04/21/20 0325 04/22/20 0139  WBC 18.8*  --   --   --  17.2* 17.0* 12.3*  CREATININE 0.99 0.90  --   --  0.77 0.87  --   LATICACIDVEN 2.6*  --  2.3* 3.0*  --   --   --     Estimated Creatinine Clearance: 101.8 mL/min (by C-G formula based on SCr of 0.87 mg/dL).    Allergies  Allergen Reactions  . Gabapentin Other (See Comments)    hallucinations    Antimicrobials this admission: Vanc 4/16 > 4/18 Cefepime 4/16 >  Microbiology results: 4/16 Bcx: ngtd 4/16 Ucx: ngF 4/16 MRSA PCR: neg 4/17 sputum: sent 4/17 legionella, strep antigen: not collected  Thank you for allowing pharmacy to be a part of this patient's care.  Fara Olden, PharmD PGY-1 Pharmacy Resident 04/22/2020 9:42 AM Please see AMION for all pharmacy numbers

## 2020-04-23 LAB — GLUCOSE, CAPILLARY
Glucose-Capillary: 103 mg/dL — ABNORMAL HIGH (ref 70–99)
Glucose-Capillary: 172 mg/dL — ABNORMAL HIGH (ref 70–99)
Glucose-Capillary: 173 mg/dL — ABNORMAL HIGH (ref 70–99)
Glucose-Capillary: 145 mg/dL — ABNORMAL HIGH (ref 70–99)

## 2020-04-23 LAB — BASIC METABOLIC PANEL
Anion gap: 6 (ref 5–15)
BUN: 31 mg/dL — ABNORMAL HIGH (ref 6–20)
CO2: 29 mmol/L (ref 22–32)
Calcium: 8.9 mg/dL (ref 8.9–10.3)
Chloride: 106 mmol/L (ref 98–111)
Creatinine, Ser: 0.73 mg/dL (ref 0.61–1.24)
GFR, Estimated: >60 mL/min (ref >60)
Glucose, Bld: 113 mg/dL — ABNORMAL HIGH (ref 70–99)
Potassium: 4.2 mmol/L (ref 3.5–5.1)
Sodium: 141 mmol/L (ref 135–145)

## 2020-04-23 NOTE — TOC Progression Note (Addendum)
Transition of Care [facility]) - Progression Note    Patient Details  Name: James [name] MRN: [id number] Date of Birth: Jul 07, 1960  Transition of [facility]) CM/SW Contact  Joanne Chars, LCSW Phone Number: 04/23/2020, 4:03 PM  Clinical Narrative:  CSW met with pt regarding recommendation for SNF.  Pt reports he is not interested in SNF but would consider HH. Discussed palliative referral to [facility] and pt seemed somewhat confused by what this involved but indicated he was willing to work with them.       Expected Discharge Plan: [place] Barriers to Discharge: Continued Medical Work up  Expected Discharge Plan and Services Expected Discharge Plan: [place] In-house Referral: Clinical Social Work Discharge Planning Services: CM Consult Post Acute Care Choice: [place] arrangements for the past 2 months: Single Family Home                 DME Arranged: N/A DME Agency: NA                   Social Determinants of Health (SDOH) Interventions    Readmission Risk Interventions Readmission Risk Prevention Plan 03/28/2020 04/26/2019  Transportation Screening Complete Complete  PCP or Specialist Appt within 3-5 Days Not Complete Complete  Not Complete comments Pt had existing appt with [name] on 04/14/20. -  [place] or [place] Complete Complete  Social Work Consult for [place] Planning/Counseling Complete Complete  Palliative Care Screening Not Applicable Not Applicable  Medication Review Press photographer) - Complete  Some recent data might be hidden

## 2020-04-23 NOTE — Progress Notes (Signed)
Physical Therapy Treatment Patient Details Name: James [name] MRN: [id number] DOB: 13-Jan-1960 Today's Date: 04/23/2020    History of Present Illness James [name] is a 60 y.o. male admitted for acute COPD exacerbation and right lower lobe pneumonia. PMH: recurrent pneumonia, COPD O2 and prednisone dependent, chronic respiratory failure on home O2 2-4 LNC, COVID-19 infection in 10/2019, HTN, T2DM, ETOH abuse.    PT Comments    Pt admitted with above diagnosis. Pt was able to ambulate on unit with RW with fair safety. Educated pt that he is safer with RW use and he stated he doesn't feel he needs it for safety.  Pt with poor safety awareness overall.  Pt will benefit from 24 hour care initially.   Pt currently with functional limitations due to balance and endurance deficits. Pt will benefit from skilled PT to increase their independence and safety with mobility to allow discharge to the venue listed below.     Follow Up Recommendations  SNF;Supervision/Assistance - 24 hour (SNF for rehab if no 24 hour care but pt may refuse and if so HHPT recommended)     Equipment Recommendations  Rolling walker with 5" wheels    Recommendations for Other Services       Precautions / Restrictions Precautions Precautions: Fall Precaution Comments: O2; 2L at baseline Restrictions Weight Bearing Restrictions: No    Mobility  Bed Mobility               General bed mobility comments: Pt was in chair on arrival    Transfers Overall transfer level: Needs assistance Equipment used: Rolling walker (2 wheeled) Transfers: Sit to/from Stand Sit to Stand: Min guard         General transfer comment: Pt needing min guard for safety with sit to stand  Ambulation/Gait Ambulation/Gait assistance: Min guard;Min assist Gait Distance (Feet): 270 Feet Assistive device: Rolling walker (2 wheeled) Gait Pattern/deviations: WFL(Within Functional Limits);Step-through pattern;Decreased stride  length;Scissoring;Drifts right/left   Gait velocity interpretation: <1.8 ft/sec, indicate of risk for recurrent falls General Gait Details: Pt needed UE support for balance as he was unsteady at times due to narrow BOS at times. With RW, pt did much better but still needing min gaurd assist today even with RW.   Stairs             Wheelchair Mobility    Modified Rankin (Stroke Patients Only)       Balance Overall balance assessment: Needs assistance         Standing balance support: Bilateral upper extremity supported;During functional activity Standing balance-Leahy Scale: Poor Standing balance comment: Pt relies on UE support on RW.                            Cognition Arousal/Alertness: Lethargic;Suspect due to medications Behavior During Therapy: Flat affect;Anxious Overall Cognitive Status: Impaired/Different from baseline Area of Impairment: Orientation;Memory;Following commands;Safety/judgement;Problem solving;Awareness                 Orientation Level: Disoriented to;Time;Situation   Memory: Decreased short-term memory Following Commands: Follows one step commands inconsistently;Follows one step commands with increased time Safety/Judgement: Decreased awareness of safety   Problem Solving: Slow processing;Decreased initiation;Difficulty sequencing;Requires verbal cues;Requires tactile cues        Exercises General Exercises - Lower Extremity Ankle Circles/Pumps: AROM;Both;10 reps;Supine Long Arc Quad: AROM;Both;10 reps;Seated    General Comments General comments (skin integrity, edema, etc.): VSS on 2LO2 >90%.  Pertinent Vitals/Pain Pain Assessment: No/denies pain    Home Living                      Prior Function            PT Goals (current goals can now be found in the care plan section) Acute Rehab PT Goals Patient Stated Goal: unable to state Progress towards PT goals: Progressing toward goals     Frequency    Min 3X/week      PT Plan Current plan remains appropriate    Co-evaluation              AM-PAC PT "6 Clicks" Mobility   Outcome Measure  Help needed turning from your back to your side while in a flat bed without using bedrails?: A Little Help needed moving from lying on your back to sitting on the side of a flat bed without using bedrails?: A Little Help needed moving to and from a bed to a chair (including a wheelchair)?: A Little Help needed standing up from a chair using your arms (e.g., wheelchair or bedside chair)?: A Little Help needed to walk in hospital room?: A Little Help needed climbing 3-5 steps with a railing? : A Little 6 Click Score: 18    End of Session Equipment Utilized During Treatment: Gait belt;Oxygen Activity Tolerance: Patient limited by fatigue Patient left: with call bell/phone within reach;in chair;with chair alarm set Nurse Communication: Mobility status PT Visit Diagnosis: Unsteadiness on feet (R26.81);Muscle weakness (generalized) (M62.81)     Time: 4825-0037 PT Time Calculation (min) (ACUTE ONLY): 15 min  Charges:  $Gait Training: 8-22 mins                     James [name] M,PT Acute [place] [phone] (pager)   Alvira Philips 04/23/2020, 1:35 PM

## 2020-04-23 NOTE — Progress Notes (Signed)
PROGRESS NOTE    James [name]  MVE:[id number] DOB: [birth year] DOA: 04/19/2020 PCP: [name], MD   Brief Narrative:  60 year old male with COPD and chronic hypoxia on 2-4 liters of oxygen, recurrent pneumonia and hospital admissions, COVID-19 in 10/2019, HTN, diabetes type 2 presented with sudden onset worsening shortness of breath , cough , wheezing for 1 day. Patient has been recently hospitalized in March 2022 with COPD exacerbation and pneumonia. Discharged on prednisone taper and currently taking prednisone 10 mg twice daily.  Patient is on poor health, he continues to drink alcoholdaily. In the emergency room, temperature 102. Chest x-ray consistent with right lower lobe pneumonia. Admitted by hospitalist, started on abx. Initially needed bipap for respiratory support but improved. Had worsening DT's on 4/18 requiring ICU tx and precedex gtt. transferred back under hospitalist service on 04/23/2020.  Assessment & Plan:   Principal Problem:   Sepsis ([place]) Active Problems:   Hypertension   History of alcohol use   Chronic respiratory failure with hypoxia (HCC)   Type 2 diabetes mellitus without complication, without long-term current use of insulin (HCC)   COPD with acute exacerbation (HCC)   Lactic acid acidosis   HCAP (healthcare-associated pneumonia)   Obesity (BMI 30.0-34.9)   Alcohol withdrawal syndrome, with delirium ([place])   Acute on chronic hypoxic respiratory failure secondary to acute COPD exacerbation and HCAP: Patient feels much better.  Still some shortness of breath and not back to baseline completely.  Requiring about 4 L of oxygen.  Tells me that he uses about 2 to 3 L during the day and 4 to 5 L at night.  We will continue oral prednisone, cefepime and bronchodilators.  Alcohol withdrawal: No more withdrawal symptoms.  CIWA has been 0 since more than 24 hours ago.  Continue CIWA protocol with as needed benzodiazepines.  Type 2 diabetes mellitus:  Hemoglobin A1c was 6.7 on 03/26/2020.  Blood sugar controlled.  Continue SSI.  Essential hypertension: Blood pressure controlled.  Continue losartan and metoprolol.   DVT prophylaxis: enoxaparin (LOVENOX) injection 40 mg Start: 04/19/20 0915   Code Status: Full Code  Family Communication: None present at bedside.  Plan of care discussed with patient in length and he verbalized understanding and agreed with it.  Status is: Inpatient  Remains inpatient appropriate because:Inpatient level of care appropriate due to severity of illness   Dispo: The patient is from: Home              Anticipated d/c is to: SNF, PT recommends SNF but patient adamant on going home.  He lives by himself.  He needs supervision which he does not have.              Patient currently is not medically stable to d/c.   Difficult to place patient No        Estimated body mass index is 30.81 kg/m as calculated from the following:   Height as of this encounter: 5\' 8"  (1.727 m).   Weight as of this encounter: 91.9 kg.      Nutritional status:               Consultants:   None  Procedures:   None  Antimicrobials:  Anti-infectives (From admission, onward)   Start     Dose/Rate Route Frequency Ordered Stop   04/21/20 1000  vancomycin (VANCOREADY) IVPB 1750 mg/350 mL  Status:  Discontinued        1,750 mg 175 mL/hr over 120  Minutes Intravenous Every 24 hours 04/21/20 0417 04/21/20 0419   04/21/20 1000  vancomycin (VANCOREADY) IVPB 1750 mg/350 mL  Status:  Discontinued        1,750 mg 175 mL/hr over 120 Minutes Intravenous Every 24 hours 04/21/20 0419 04/22/20 0921   04/20/20 1000  vancomycin (VANCOREADY) IVPB 1750 mg/350 mL  Status:  Discontinued        1,750 mg 175 mL/hr over 120 Minutes Intravenous Every 24 hours 04/19/20 2238 04/21/20 0417   04/20/20 0900  vancomycin (VANCOREADY) IVPB 1750 mg/350 mL  Status:  Discontinued        1,750 mg 175 mL/hr over 120 Minutes Intravenous Every 24  hours 04/19/20 0903 04/19/20 2238   04/19/20 2200  ceFEPIme (MAXIPIME) 2 g in sodium chloride 0.9 % 100 mL IVPB        2 g 200 mL/hr over 30 Minutes Intravenous Every 8 hours 04/19/20 1022 04/24/20 2159   04/19/20 1200  ceFEPIme (MAXIPIME) 2 g in sodium chloride 0.9 % 100 mL IVPB  Status:  Discontinued        2 g 200 mL/hr over 30 Minutes Intravenous  Once 04/19/20 0841 04/19/20 1022   04/19/20 1030  ceFEPIme (MAXIPIME) 2 g in sodium chloride 0.9 % 100 mL IVPB        2 g 200 mL/hr over 30 Minutes Intravenous  Once 04/19/20 1022 04/19/20 1335   04/19/20 0900  vancomycin (VANCOREADY) IVPB 2000 mg/400 mL        2,000 mg 200 mL/hr over 120 Minutes Intravenous  Once 04/19/20 0858 04/19/20 1215   04/19/20 0715  cefTRIAXone (ROCEPHIN) 2 g in sodium chloride 0.9 % 100 mL IVPB  Status:  Discontinued        2 g 200 mL/hr over 30 Minutes Intravenous Every 24 hours 04/19/20 0702 04/19/20 0905   04/19/20 0715  azithromycin (ZITHROMAX) 500 mg in sodium chloride 0.9 % 250 mL IVPB  Status:  Discontinued        500 mg 250 mL/hr over 60 Minutes Intravenous Every 24 hours 04/19/20 0702 04/19/20 1022         Subjective: Patient seen and examined.  Feels much better with only minimal shortness of breath.  No other complaint.  No withdrawal symptoms today.  Objective: Vitals:   04/23/20 0411 04/23/20 0723 04/23/20 0744 04/23/20 1205  BP: (!) 131/96  136/82 123/77  Pulse: 80 79 86 90  Resp: 20 16 12 14   Temp: 98.9 F (37.2 C)  98.3 F (36.8 C)   TempSrc: Oral  Oral   SpO2: 95% 97% 96% 97%  Weight:      Height:        Intake/Output Summary (Last 24 hours) at 04/23/2020 1348 Last data filed at 04/23/2020 1002 Gross per 24 hour  Intake 389.9 ml  Output 1000 ml  Net -610.1 ml   Filed Weights   04/19/20 1327 04/20/20 0325 04/21/20 0200  Weight: 90.8 kg 90.5 kg 91.9 kg    Examination:  General exam: Appears calm and comfortable  Respiratory system: Bilateral rhonchi and diffuse expiratory  wheezes bilaterally. Respiratory effort normal. Cardiovascular system: S1 & S2 heard, RRR. No JVD, murmurs, rubs, gallops or clicks. No pedal edema. Gastrointestinal system: Abdomen is nondistended, soft and nontender. No organomegaly or masses felt. Normal bowel sounds heard. Central nervous system: Alert and oriented. No focal neurological deficits. Extremities: Symmetric 5 x 5 power. Skin: No rashes, lesions or ulcers Psychiatry: Judgement and insight appear normal. Mood &  affect appropriate.    Data Reviewed: I have personally reviewed following labs and imaging studies  CBC: Recent Labs  Lab 04/19/20 0705 04/19/20 0710 04/19/20 0904 04/20/20 0329 04/21/20 0325 04/22/20 0139  WBC 18.8*  --   --  17.2* 17.0* 12.3*  NEUTROABS 14.9*  --   --  16.2* 15.8*  --   HGB 14.1 15.6 12.6* 11.7* 11.3* 12.3*  HCT 44.8 46.0 37.0* 37.8* 36.7* 39.3  MCV 93.1  --   --  92.2 93.9 92.0  PLT 341  --   --  254 284 299   Basic Metabolic Panel: Recent Labs  Lab 04/19/20 0705 04/19/20 0710 04/19/20 0904 04/20/20 0329 04/21/20 0325 04/23/20 0344  NA 143 141 140 140 140 141  K 3.9 3.8 4.0 4.2 4.9 4.2  CL 103 101  --  105 105 106  CO2 29  --   --  30 28 29   GLUCOSE 134* 131*  --  201* 157* 113*  BUN 14 18  --  16 23* 31*  CREATININE 0.99 0.90  --  0.77 0.87 0.73  CALCIUM 9.5  --   --  8.9 9.3 8.9  MG  --   --   --   --  2.3  --   PHOS  --   --   --   --  3.4  --    GFR: Estimated Creatinine Clearance: 110.8 mL/min (by C-G formula based on SCr of 0.73 mg/dL). Liver Function Tests: Recent Labs  Lab 04/19/20 0705  AST 31  ALT 29  ALKPHOS 43  BILITOT 0.6  PROT 6.5  ALBUMIN 4.0   No results for input(s): LIPASE, AMYLASE in the last 168 hours. No results for input(s): AMMONIA in the last 168 hours. Coagulation Profile: Recent Labs  Lab 04/19/20 0705 04/20/20 0329  INR 0.9 1.0   Cardiac Enzymes: No results for input(s): CKTOTAL, CKMB, CKMBINDEX, TROPONINI in the last 168  hours. BNP (last 3 results) No results for input(s): PROBNP in the last 8760 hours. HbA1C: No results for input(s): HGBA1C in the last 72 hours. CBG: Recent Labs  Lab 04/22/20 1155 04/22/20 1813 04/22/20 2041 04/23/20 0741 04/23/20 1146  GLUCAP 166* 143* 181* 103* 172*   Lipid Profile: No results for input(s): CHOL, HDL, LDLCALC, TRIG, CHOLHDL, LDLDIRECT in the last 72 hours. Thyroid Function Tests: No results for input(s): TSH, T4TOTAL, FREET4, T3FREE, THYROIDAB in the last 72 hours. Anemia Panel: No results for input(s): VITAMINB12, FOLATE, FERRITIN, TIBC, IRON, RETICCTPCT in the last 72 hours. Sepsis Labs: Recent Labs  Lab 04/19/20 0705 04/19/20 0918 04/19/20 1546  PROCALCITON  --  0.67  --   LATICACIDVEN 2.6* 2.3* 3.0*    Recent Results (from the past 240 hour(s))  Resp Panel by RT-PCR (Flu A&B, Covid) Nasopharyngeal Swab     Status: None   Collection Time: 04/19/20  6:52 AM   Specimen: Nasopharyngeal Swab; Nasopharyngeal(NP) swabs in vial transport medium  Result Value Ref Range Status   SARS Coronavirus 2 by RT PCR NEGATIVE NEGATIVE Final    Comment: (NOTE) SARS-CoV-2 target nucleic acids are NOT DETECTED.  The SARS-CoV-2 RNA is generally detectable in upper respiratory specimens during the acute phase of infection. The lowest concentration of SARS-CoV-2 viral copies this assay can detect is 138 copies/mL. A negative result does not preclude SARS-Cov-2 infection and should not be used as the sole basis for treatment or other patient management decisions. A negative result may occur with  improper specimen  collection/handling, submission of specimen other than nasopharyngeal swab, presence of viral mutation(s) within the areas targeted by this assay, and inadequate number of viral copies(<138 copies/mL). A negative result must be combined with clinical observations, patient history, and epidemiological information. The expected result is Negative.  Fact Sheet  for Patients:  EntrepreneurPulse.com.au  Fact Sheet for Healthcare Providers:  IncredibleEmployment.be  This test is no t yet approved or cleared by the [place] FDA and  has been authorized for detection and/or diagnosis of SARS-CoV-2 by FDA under an Emergency Use Authorization (EUA). This EUA will remain  in effect (meaning this test can be used) for the duration of the COVID-19 declaration under Section 564(b)(1) of the Act, 21 U.S.C.section 360bbb-3(b)(1), unless the authorization is terminated  or revoked sooner.       Influenza A by PCR NEGATIVE NEGATIVE Final   Influenza B by PCR NEGATIVE NEGATIVE Final    Comment: (NOTE) The Xpert Xpress SARS-CoV-2/FLU/RSV plus assay is intended as an aid in the diagnosis of influenza from Nasopharyngeal swab specimens and should not be used as a sole basis for treatment. Nasal washings and aspirates are unacceptable for Xpert Xpress SARS-CoV-2/FLU/RSV testing.  Fact Sheet for Patients: EntrepreneurPulse.com.au  Fact Sheet for Healthcare Providers: IncredibleEmployment.be  This test is not yet approved or cleared by the [place] FDA and has been authorized for detection and/or diagnosis of SARS-CoV-2 by FDA under an Emergency Use Authorization (EUA). This EUA will remain in effect (meaning this test can be used) for the duration of the COVID-19 declaration under Section 564(b)(1) of the Act, 21 U.S.C. section 360bbb-3(b)(1), unless the authorization is terminated or revoked.  Performed at [facility], [place] [address], [place], [place] [address]   Blood culture (routine x 2)     Status: None (Preliminary result)   Collection Time: 04/19/20  6:53 AM   Specimen: BLOOD RIGHT HAND  Result Value Ref Range Status   Specimen Description BLOOD RIGHT HAND  Final   Special Requests   Final    BOTTLES DRAWN AEROBIC AND ANAEROBIC Blood Culture results may  not be optimal due to an inadequate volume of blood received in culture bottles   Culture   Final    NO GROWTH 4 DAYS Performed at [facility], [place] [address], [place], [place] [address]    Report Status PENDING  Incomplete  Blood culture (routine x 2)     Status: None (Preliminary result)   Collection Time: 04/19/20  6:58 AM   Specimen: BLOOD LEFT HAND  Result Value Ref Range Status   Specimen Description BLOOD LEFT HAND  Final   Special Requests   Final    BOTTLES DRAWN AEROBIC AND ANAEROBIC Blood Culture adequate volume   Culture   Final    NO GROWTH 4 DAYS Performed at [facility], [place] [address] [place], [place] [address]    Report Status PENDING  Incomplete  MRSA PCR Screening     Status: None   Collection Time: 04/19/20  8:56 AM   Specimen: Nasopharyngeal  Result Value Ref Range Status   MRSA by PCR NEGATIVE NEGATIVE Final    Comment:        The GeneXpert MRSA Assay (FDA approved for NASAL specimens only), is one component of a comprehensive MRSA colonization surveillance program. It is not intended to diagnose MRSA infection nor to guide or monitor treatment for MRSA infections. Performed at [facility], [place],  [place] [address]   Urine culture     Status: None   Collection Time: 04/19/20 11:50 AM   Specimen: In/Out Cath Urine  Result Value Ref Range Status   Specimen Description IN/OUT CATH URINE  Final   Special Requests NONE  Final   Culture   Final    NO GROWTH Performed at [facility], [place] [address], [place], [place] [address]    Report Status 04/20/2020 FINAL  Final      Radiology Studies: No results found.  Scheduled Meds: . albuterol  2.5 mg Nebulization TID  . Chlorhexidine Gluconate Cloth  6 each Topical Q0600  . dextromethorphan-guaiFENesin  1 tablet Oral BID  . enoxaparin (LOVENOX) injection  40 mg Subcutaneous Q24H  . insulin aspart  0-9 Units Subcutaneous TID WC  . losartan  100 mg Oral  Daily  . mouth rinse  15 mL Mouth Rinse BID  . metoprolol succinate  25 mg Oral Daily  . mometasone-formoterol  2 puff Inhalation BID  . predniSONE  40 mg Oral Q breakfast  . umeclidinium bromide  1 puff Inhalation Daily   Continuous Infusions: . ceFEPime (MAXIPIME) IV 2 g (04/23/20 1249)     LOS: 4 days   Time spent: 36 minutes   [name], MD [facility]  04/23/2020, 1:48 PM   To contact the attending provider between 7A-7P or the covering provider during after hours 7P-7A, please log into the web site [email or web address].

## 2020-04-23 NOTE — TOC CAGE-AID Note (Signed)
Transition of Care [facility]) - CAGE-AID Screening   Patient Details  Name: James [name] MRN: [id number] Date of Birth: 03-01-1960  [facility] CM/SW Contact:    Joanne Chars, LCSW Phone Number: 04/23/2020, 3:59 PM   Clinical Narrative:CSW met with pt to complete Cage Aid.  Pt reports he drinks alcohol 4-5 days per week, no more than 4 beers.  Pt denies any illegal drug use.  (no recent toxicology on labs)  Pt acknowledges that his alcohol use is causing some issues in his life, reports that he was able to stop drinking for 4 months "recently."  Denies any prior treatment, denies AA involvement.  CSW discussed treatment options but pt reports he is not interested in formal treatment.    CAGE-AID Screening:    Have You Ever Felt You Ought to Cut Down on Your Drinking or Drug Use?: Yes Have People Annoyed You By Critizing Your Drinking Or Drug Use?: No Have You Felt Bad Or Guilty About Your Drinking Or Drug Use?: No Have You Ever Had a Drink or Used Drugs First Thing In The Morning to Steady Your Nerves or to Get Rid of a Hangover?: No CAGE-AID Score: 1  Substance Abuse Education Offered: Yes  Substance abuse interventions: Patient Counseling

## 2020-04-24 ENCOUNTER — Other Ambulatory Visit (HOSPITAL_COMMUNITY): Payer: Self-pay

## 2020-04-24 ENCOUNTER — Telehealth: Payer: Self-pay | Admitting: Critical Care Medicine

## 2020-04-24 LAB — CULTURE, BLOOD (ROUTINE X 2)
Culture: NO GROWTH
Culture: NO GROWTH
Special Requests: ADEQUATE

## 2020-04-24 LAB — BASIC METABOLIC PANEL
Anion gap: 6 (ref 5–15)
BUN: 26 mg/dL — ABNORMAL HIGH (ref 6–20)
CO2: 30 mmol/L (ref 22–32)
Calcium: 9 mg/dL (ref 8.9–10.3)
Chloride: 103 mmol/L (ref 98–111)
Creatinine, Ser: 0.69 mg/dL (ref 0.61–1.24)
GFR, Estimated: >60 mL/min (ref >60)
Glucose, Bld: 121 mg/dL — ABNORMAL HIGH (ref 70–99)
Potassium: 4.5 mmol/L (ref 3.5–5.1)
Sodium: 139 mmol/L (ref 135–145)

## 2020-04-24 LAB — GLUCOSE, CAPILLARY
Glucose-Capillary: 116 mg/dL — ABNORMAL HIGH (ref 70–99)
Glucose-Capillary: 233 mg/dL — ABNORMAL HIGH (ref 70–99)
Glucose-Capillary: 208 mg/dL — ABNORMAL HIGH (ref 70–99)

## 2020-04-24 MED ORDER — PREDNISONE 10 MG PO TABS
ORAL_TABLET | ORAL | 0 refills | Status: DC
  Filled 2020-04-24: qty 50, 12d supply, fill #0

## 2020-04-24 NOTE — TOC Transition Note (Addendum)
Transition of Care [facility]) - CM/SW Discharge Note   Patient Details  Name: James [name] MRN: [id number] Date of Birth: [birth year]  Transition of Care [facility]) CM/SW Contact:  Joanne Chars, LCSW Phone Number: 04/24/2020, 1:06 PM   Clinical Narrative:   Pt discharging home with [facility].  Pt declines recommended DME: walker, 3n1.  [facility] has accepted palliative referral.  Hospital discharge appt in place.  Sister will pick pt up from hospital and will bring home O2 with her.   1530: RN reports pt unable to get ahold of his sister for ride.  CSW unable to reach sister [name], CSW was able to reach sister Dory Larsen, who lives in [place].  She was eventually able to text [name] who informed her she is not coming to give pt a ride.  CSW spoke with [employer] who will bring tank of home O2,, CSW arranged transport with [facility] transportation department, waiver signed and placed in chart.  RN Claiborne Billings to call for ride once O2 arrives.     Final next level of care: [place] Barriers to Discharge: Barriers Resolved   Patient Goals and CMS Choice Patient states their goals for this hospitalization and ongoing recovery are:: return home CMS Medicare.gov Compare Post Acute Care list provided to:: Patient Choice offered to / list presented to : Patient  Discharge Placement                       Discharge Plan and Services In-house Referral: Clinical Social Work Discharge Planning Services: CM Consult Post Acute Care Choice: Home Health          DME Arranged:  (Pt declined walker, 3n1) DME Agency: NA       HH Arranged: PT,OT [place] Agency: Other - See comment (Medi) Date HH Agency Contacted: 04/24/20 Time HH Agency Contacted: 1100 Representative spoke with at [place]: [place] ([place]) Interventions     Readmission Risk Interventions Readmission Risk Prevention Plan 04/24/2020 03/28/2020 04/26/2019  Transportation Screening Complete  Complete Complete  PCP or Specialist Appt within 3-5 Days - Not Complete Complete  Not Complete comments - Pt had existing appt with [name] on 04/14/20. -  [place] or [place] - Complete Complete  Social Work Consult for [place] Planning/Counseling - Complete Complete  Palliative Care Screening - Not Applicable Not Applicable  Medication Review Press photographer) - - Complete  PCP or Specialist appointment within 3-5 days of discharge Not Complete - -  PCP/Specialist Appt Not Complete comments Pt had existing appt 5/23--this is first available per [place] or Home Care Consult Complete - -  SW Recovery Care/Counseling Consult Complete - -  Palliative Care Screening Complete - -  [place] Not Applicable - -  Some recent data might be hidden

## 2020-04-24 NOTE — Progress Notes (Signed)
Occupational Therapy Treatment Patient Details Name: James [name] MRN: [id number] DOB: [birth year] Today's Date: 04/24/2020    History of present illness James [name] is a 60 y.o. male admitted for acute COPD exacerbation and right lower lobe pneumonia. PMH: recurrent pneumonia, COPD O2 and prednisone dependent, chronic respiratory failure on home O2 2-4 LNC, COVID-19 infection in 10/2019, HTN, T2DM, ETOH abuse.   OT comments  Pt making progress with functional goals. Alert today, but still with impaired safety awareness. Session focused on functional mobility using RW to bathroom for toileting and to stand at sink for grooming, LB ADLs. OT will continue to follow acutely to maximize level of function and safety  Follow Up Recommendations  Supervision/Assistance - 24 hour;SNF (HH if refusing SNF)    Equipment Recommendations  3 in 1 bedside commode;Other (comment) (RW)    Recommendations for Other Services      Precautions / Restrictions Precautions Precautions: Fall Precaution Comments: O2; 2L at baseline Restrictions Weight Bearing Restrictions: No       Mobility Bed Mobility               General bed mobility comments: pt in recliner upon arrival    Transfers Overall transfer level: Needs assistance Equipment used: Rolling walker (2 wheeled) Transfers: Sit to/from Stand Sit to Stand: Min guard         General transfer comment: Pt needing min guard for safety    Balance Overall balance assessment: Needs assistance Sitting-balance support: No upper extremity supported;Feet supported;Bilateral upper extremity supported Sitting balance-Leahy Scale: Fair     Standing balance support: Bilateral upper extremity supported;During functional activity Standing balance-Leahy Scale: Poor                             ADL either performed or assessed with clinical judgement   ADL Overall ADL's : Needs assistance/impaired     Grooming: Wash/dry  face;Wash/dry hands;Min guard;Standing           Upper Body Dressing : Set up;Supervision/safety;Sitting   Lower Body Dressing: Min guard;Sit to/from stand   Toilet Transfer: Min guard;Ambulation;RW;Comfort height toilet;Grab bars;Cueing for safety   Toileting- Clothing Manipulation and Hygiene: Sit to/from stand;Minimal assistance;Min guard       Functional mobility during ADLs: Min guard;Cueing for safety;Rolling walker       Vision Baseline Vision/History: No visual deficits Patient Visual Report: No change from baseline     Perception     Praxis      Cognition Arousal/Alertness: Awake/alert Behavior During Therapy: WFL for tasks assessed/performed Overall Cognitive Status: Impaired/Different from baseline Area of Impairment: Orientation;Memory;Following commands;Safety/judgement;Problem solving;Awareness                       Following Commands: Follows one step commands inconsistently;Follows one step commands with increased time     Problem Solving: Difficulty sequencing;Requires verbal cues          Exercises     Shoulder Instructions       General Comments      Pertinent Vitals/ Pain       Pain Assessment: No/denies pain Faces Pain Scale: No hurt Pain Intervention(s): Monitored during session  Home Living Family/patient expects to be discharged to:: Private residence Living Arrangements: Alone  Prior Functioning/Environment              Frequency  Min 2X/week        Progress Toward Goals  OT Goals(current goals can now be found in the care plan section)  Progress towards OT goals: Progressing toward goals  Acute Rehab OT Goals Patient Stated Goal: to go home  Plan Discharge plan remains appropriate    Co-evaluation                 AM-PAC OT "6 Clicks" Daily Activity     Outcome Measure   Help from another person eating meals?: None Help from another  person taking care of personal grooming?: A Little Help from another person toileting, which includes using toliet, bedpan, or urinal?: A Little Help from another person bathing (including washing, rinsing, drying)?: A Little Help from another person to put on and taking off regular upper body clothing?: A Little Help from another person to put on and taking off regular lower body clothing?: A Little 6 Click Score: 19    End of Session Equipment Utilized During Treatment: Gait belt  OT Visit Diagnosis: Unsteadiness on feet (R26.81);Muscle weakness (generalized) (M62.81);Pain;Other symptoms and signs involving cognitive function   Activity Tolerance Patient tolerated treatment well   Patient Left with call bell/phone within reach;in chair;with chair alarm set   Nurse Communication          Time: 4481-8563 OT Time Calculation (min): 16 min  Charges: OT General Charges $OT Visit: 1 Visit OT Treatments $Self Care/Home Management : 8-22 mins     Britt Bottom 04/24/2020, 1:53 PM

## 2020-04-24 NOTE — Progress Notes (Signed)
[facility] ([facility])  Hospital Liaison RN note         Notified by TOC manager of patient/family request for [facility] Palliative services at home after discharge.              [facility] Palliative team will follow up with patient after discharge.         Please call with any hospice or palliative related questions.         Thank you for the opportunity to participate in this patient's care.     Chrislyn King, BSN, RN [facility] Hospital Liaison (listed on AMION under Hospice/[facility])    [phone] (24h on call)    

## 2020-04-24 NOTE — Telephone Encounter (Signed)
Copied from [place] [phone]. Topic: Quick Communication - Home Health Verbal Orders >> Apr 24, 2020  2:21 PM Leward Quan A wrote: Caller/Agency: [facility] / [place] Number: [phone] ok to LM Requesting OT/PT/Skilled Nursing/Social Work/Speech Therapy: PT & OT  Frequency: Evaluation

## 2020-04-24 NOTE — Discharge Summary (Signed)
Physician Discharge Summary  James [name] DTO:[id number] DOB: 1960-04-13 DOA: 04/19/2020  PCP: [name], MD  Admit date: 04/19/2020 Discharge date: 04/24/2020 30 Day Unplanned Readmission Risk Score   Flowsheet Row ED to Hosp-Admission (Current) from 04/19/2020 in [facility] [facility]  30 Day Unplanned Readmission Risk Score (%) 34.31 Filed at 04/24/2020 0801     This score is the patient's risk of an unplanned readmission within 30 days of being discharged (0 -100%). The score is based on dignosis, age, lab data, medications, orders, and past utilization.   Low:  0-14.9   Medium: 15-21.9   High: 22-29.9   Extreme: 30 and above         Admitted From: Home Disposition: Home  Recommendations for Outpatient Follow-up:  1. Follow up with PCP in 1-2 weeks 2. Follow-up with pulmonologist in 2 to 4 weeks 3. Please obtain BMP/CBC in one week 4. Please follow up with your PCP on the following pending results: Unresulted Labs (From admission, onward)          Start     Ordered   04/20/20 0938  Strep pneumoniae urinary antigen  Once,   R        04/20/20 0937   04/20/20 0936  Expectorated Sputum Assessment w Gram Stain, Rflx to Resp Cult  Once,   R        04/20/20 0937   04/20/20 0936  Legionella Pneumophila Serogp 1 Ur Ag  Once,   R        04/20/20 [place]: Yes Equipment/Devices: Home oxygen  Discharge Condition: Stable CODE STATUS: Full code Diet recommendation: Cardiac  Subjective: Seen and examined.  Continues to feel well.  No complaints.  No shortness of breath.  Wants to go home.  Brief/Interim Summary: 60 year old male with COPD and chronic hypoxia on 2-4 liters of oxygen, recurrent pneumonia and hospital admissions, COVID-19 in 10/2019, HTN, diabetes type 2 presented with sudden onset worsening shortness of breath , cough , wheezing for 1 day. Patient has been recently hospitalized in March 2022 with COPD exacerbation and  pneumonia. Discharged on prednisone taper and currently taking prednisone 10 mg twice daily.  Patient is on poor health, he continues to drink alcoholdaily. In the emergency room, temperature 102. Chest x-ray consistent with right lower lobe pneumonia. Admitted by hospitalist, started on abx. Initially needed bipap for respiratory support but improved. Had worsening DT's on 4/18 requiring ICU tx and precedex gtt. transferred back under hospitalist service on 04/23/2020.  Completed 5 days of cefepime today.  Has been weaned down to his baseline home oxygen which is between 2 and 4 L.  Was evaluated by PT OT who strongly recommended 24-hour supervision or SNF if no supervision.  Patient lives alone.  Patient was strongly recommended to go to SNF however he declined that.  He wants to go home and is agreeable with home health which has been ordered for him.  He has not required any as needed benzodiazepine for his alcohol withdrawal for last 2 days.  He is being discharged in stable condition.  He has been counseled to stay away from alcohol.  Discharge Diagnoses:  Principal Problem:   Sepsis ([place]) Active Problems:   Hypertension   History of alcohol use   Chronic respiratory failure with hypoxia (HCC)   Type 2 diabetes mellitus without complication, without long-term current use of insulin ([place])  COPD with acute exacerbation (HCC)   Lactic acid acidosis   HCAP (healthcare-associated pneumonia)   Obesity (BMI 30.0-34.9)   Alcohol withdrawal syndrome, with delirium ([place])    Discharge Instructions   Allergies as of 04/24/2020      Reactions   Gabapentin Other (See Comments)   hallucinations      Medication List    TAKE these medications   Accu-Chek Guide Me w/Device Kit 1 each by Other route in the morning and at bedtime.   Accu-Chek Softclix Lancets lancets Use as instructed to check blood sugar once daily.   albuterol 108 (90 Base) MCG/ACT inhaler Commonly known as: VENTOLIN  HFA Inhale 2 puffs into the lungs every 4 (four) hours as needed for wheezing or shortness of breath.   albuterol (2.5 MG/3ML) 0.083% nebulizer solution Commonly known as: PROVENTIL Take 3 mLs (2.5 mg total) by nebulization every 6 (six) hours as needed for wheezing or shortness of breath.   budesonide-formoterol 160-4.5 MCG/ACT inhaler Commonly known as: SYMBICORT INHALE 2 PUFFS INTO THE LUNGS 2 (TWO) TIMES DAILY.   Flutter Devi Use 4 times daily What changed:   how much to take  how to take this  when to take this   furosemide 20 MG tablet Commonly known as: LASIX TAKE 1 TABLET (20 MG TOTAL) BY MOUTH DAILY AS NEEDED FOR EDEMA (LOWER EXTREMITY). What changed:   how much to take  how to take this  when to take this  reasons to take this   guaiFENesin 600 MG 12 hr tablet Commonly known as: MUCINEX TAKE 1 TABLET (600 MG TOTAL) BY MOUTH 2 (TWO) TIMES DAILY.   ketoconazole 2 % cream Commonly known as: NIZORAL 1 (ONE) APPLICATION TO AFFECTED AREA TWICE A DAY   losartan 100 MG tablet Commonly known as: COZAAR TAKE 1 TABLET (100 MG TOTAL) BY MOUTH DAILY. What changed: how much to take   metFORMIN 1000 MG tablet Commonly known as: GLUCOPHAGE Take 1 tablet (1,000 mg total) by mouth 2 (two) times daily with a meal.   metoprolol succinate 25 MG 24 hr tablet Commonly known as: TOPROL-XL TAKE 1 TABLET (25 MG TOTAL) BY MOUTH DAILY. What changed: how much to take   multivitamin with minerals Tabs tablet Take 1 tablet by mouth daily.   pantoprazole 40 MG tablet Commonly known as: PROTONIX Take 1 tablet (40 mg total) by mouth daily.   predniSONE 10 MG tablet Commonly known as: DELTASONE Take 40 mg (4 tabs) daily for 3 days, followed by 30 mg (3 tabs) po daily for 3 days starting 4/25, followed by 20 mg (2 tabs) po daily for 3 days starting 4/28, followed by 10 mg po daily until seen by pulmonologist Start taking on: April 25, 2020 What changed: additional  instructions   Tiotropium Bromide Monohydrate 2.5 MCG/ACT Aers INHALE 2 PUFFS INTO THE LUNGS DAILY.   True Metrix Blood Glucose Test test strip Generic drug: glucose blood Use as instructed       Follow-up Information    [facility].   Specialty: PALLIATIVE CARE Why: Outpatient palliative care to follow up  Contact information: [place]       [name], MD Follow up in 2 week(s).   Specialty: Pulmonary Disease Contact information: [address] [address] [phone]        O'Neal, Cassie Freer, MD .   Specialties: Cardiology, Internal Medicine, Radiology Contact information: [address]  [place] [address] [phone]              Allergies  Allergen Reactions  . Gabapentin Other (See Comments)    hallucinations    Consultations: None   Procedures/Studies: CT CHEST WO CONTRAST  Result Date: 04/20/2020 CLINICAL DATA:  Pneumonia in the RIGHT LOWER lobe. Rule out malignancy. EXAM: CT CHEST WITHOUT CONTRAST TECHNIQUE: Multidetector CT imaging of the chest was performed following the standard protocol without IV contrast. COMPARISON:  Portable chest on 04/19/2020 and CT of the chest on 07/05/2019 FINDINGS: Cardiovascular: Heart size normal. No pericardial effusion. Minimal coronary artery calcification. There is atherosclerotic calcification of the thoracic aorta not associated with aneurysm. The noncontrast appearance of the pulmonary arteries is normal. Mediastinum/Nodes: Normal esophagus. No significant mediastinal, hilar, or axillary adenopathy. The visualized portion of the thyroid gland has a normal appearance. Lungs/Pleura: There is panlobular emphysema of the lung apices. Paraseptal emphysema particularly noted in the perihilar regions. Focal Tree-in-bud type opacities are identified in the anterior RIGHT UPPER lobe and RIGHT middle lobe. There is  consolidation in the posterior RIGHT LOWER lobe and posterior RIGHT middle lobe, likely infectious. There is no central obstruction. Airways are patent. No suspicious pulmonary nodules. Upper Abdomen: Gallbladder is present. UPPER abdomen is unremarkable. Musculoskeletal: There is mild anterior compression deformity of T11 and T12, chronic in appearance. IMPRESSION: 1. Consolidative changes in the posterior RIGHT LOWER lobe and posterior RIGHT middle lobe, likely infectious. 2. Tree-in-bud type opacities in the anterior RIGHT UPPER lobe and RIGHT middle lobe, consistent with infectious/inflammatory process. 3. No adenopathy or struck shin to indicate presence of malignancy. 4. Aortic Atherosclerosis (ICD10-I70.0) and Emphysema (ICD10-J43.9). 5. Chronic anterior wedge compression fractures of T11 and T12. Electronically Signed   By: Nolon Nations M.D.   On: 04/20/2020 09:43   DG Chest Port 1 View  Result Date: 04/21/2020 CLINICAL DATA:  Acute respiratory distress EXAM: PORTABLE CHEST 1 VIEW COMPARISON:  04/19/2020 FINDINGS: Lingular atelectasis or scarring. Previously seen right lower lobe opacity has resolved. Heart is normal size. Heart is normal size. Multiple old bilateral rib fractures. No acute bony abnormality. IMPRESSION: Left base atelectasis or scarring. Electronically Signed   By: Rolm Baptise M.D.   On: 04/21/2020 03:12   DG Chest Portable 1 View  Result Date: 04/19/2020 CLINICAL DATA:  Shortness of breath, respiratory distress. EXAM: PORTABLE CHEST 1 VIEW COMPARISON:  Chest x-rays dated 03/06/2020 and 11/16/2019. FINDINGS: New dense opacity at the RIGHT lung base, suspected pneumonia. No pleural effusion or pneumothorax is seen. Multiple old healed rib fractures bilaterally. No acute appearing osseous abnormality. Heart size and mediastinal contours are stable given the semi-erect patient positioning. IMPRESSION: New dense opacity at the RIGHT lung base, suspected pneumonia. Consider chest CT  for confirmation and to exclude the alternative possibility of malignancy. Electronically Signed   By: Franki Cabot M.D.   On: 04/19/2020 07:46   DG Chest Portable 1 View  Result Date: 03/26/2020 CLINICAL DATA:  Shortness of breath EXAM: PORTABLE CHEST 1 VIEW COMPARISON:  11/16/2019 FINDINGS: Increased markings at the bases with apical lucency. Normal heart size. No edema, effusion, or pneumothorax. Multiple remote bilateral rib fractures. IMPRESSION: 1. Chronic bronchitic markings versus bronchopneumonia at the bases. 2. Emphysema. Electronically Signed   By: Monte Fantasia M.D.   On: 03/26/2020 05:04      Discharge Exam: Vitals:   04/24/20 0806 04/24/20 0809  BP:    Pulse:    Resp:    Temp:  SpO2: 97% 97%   Vitals:   04/24/20 0736 04/24/20 0805 04/24/20 0806 04/24/20 0809  BP: 140/69     Pulse: 64     Resp: 19     Temp: 98.4 F (36.9 C)     TempSrc: Oral     SpO2: 100% 98% 97% 97%  Weight:      Height:        General: Pt is alert, awake, not in acute distress Cardiovascular: RRR, S1/S2 +, no rubs, no gallops Respiratory: Diminished breath sounds with very minimal end expiratory wheezes, no rhonchi Abdominal: Soft, NT, ND, bowel sounds + Extremities: no edema, no cyanosis    The results of significant diagnostics from this hospitalization (including imaging, microbiology, ancillary and laboratory) are listed below for reference.     Microbiology: Recent Results (from the past 240 hour(s))  Resp Panel by RT-PCR (Flu A&B, Covid) Nasopharyngeal Swab     Status: None   Collection Time: 04/19/20  6:52 AM   Specimen: Nasopharyngeal Swab; Nasopharyngeal(NP) swabs in vial transport medium  Result Value Ref Range Status   SARS Coronavirus 2 by RT PCR NEGATIVE NEGATIVE Final    Comment: (NOTE) SARS-CoV-2 target nucleic acids are NOT DETECTED.  The SARS-CoV-2 RNA is generally detectable in upper respiratory specimens during the acute phase of infection. The  lowest concentration of SARS-CoV-2 viral copies this assay can detect is 138 copies/mL. A negative result does not preclude SARS-Cov-2 infection and should not be used as the sole basis for treatment or other patient management decisions. A negative result may occur with  improper specimen collection/handling, submission of specimen other than nasopharyngeal swab, presence of viral mutation(s) within the areas targeted by this assay, and inadequate number of viral copies(<138 copies/mL). A negative result must be combined with clinical observations, patient history, and epidemiological information. The expected result is Negative.  Fact Sheet for Patients:  EntrepreneurPulse.com.au  Fact Sheet for Healthcare Providers:  IncredibleEmployment.be  This test is no t yet approved or cleared by the [place] FDA and  has been authorized for detection and/or diagnosis of SARS-CoV-2 by FDA under an Emergency Use Authorization (EUA). This EUA will remain  in effect (meaning this test can be used) for the duration of the COVID-19 declaration under Section 564(b)(1) of the Act, 21 U.S.C.section 360bbb-3(b)(1), unless the authorization is terminated  or revoked sooner.       Influenza A by PCR NEGATIVE NEGATIVE Final   Influenza B by PCR NEGATIVE NEGATIVE Final    Comment: (NOTE) The Xpert Xpress SARS-CoV-2/FLU/RSV plus assay is intended as an aid in the diagnosis of influenza from Nasopharyngeal swab specimens and should not be used as a sole basis for treatment. Nasal washings and aspirates are unacceptable for Xpert Xpress SARS-CoV-2/FLU/RSV testing.  Fact Sheet for Patients: EntrepreneurPulse.com.au  Fact Sheet for Healthcare Providers: IncredibleEmployment.be  This test is not yet approved or cleared by the [place] FDA and has been authorized for detection and/or diagnosis of SARS-CoV-2 by FDA under  an Emergency Use Authorization (EUA). This EUA will remain in effect (meaning this test can be used) for the duration of the COVID-19 declaration under Section 564(b)(1) of the Act, 21 U.S.C. section 360bbb-3(b)(1), unless the authorization is terminated or revoked.  Performed at [facility], [place] [address] [place], [place] [address]   Blood culture (routine x 2)     Status: None   Collection Time: 04/19/20  6:53 AM   Specimen: BLOOD RIGHT HAND  Result  Value Ref Range Status   Specimen Description BLOOD RIGHT HAND  Final   Special Requests   Final    BOTTLES DRAWN AEROBIC AND ANAEROBIC Blood Culture results may not be optimal due to an inadequate volume of blood received in culture bottles   Culture   Final    NO GROWTH 5 DAYS Performed at [facility], [place] [address], [place], [place] [address]    Report Status 04/24/2020 FINAL  Final  Blood culture (routine x 2)     Status: None   Collection Time: 04/19/20  6:58 AM   Specimen: BLOOD LEFT HAND  Result Value Ref Range Status   Specimen Description BLOOD LEFT HAND  Final   Special Requests   Final    BOTTLES DRAWN AEROBIC AND ANAEROBIC Blood Culture adequate volume   Culture   Final    NO GROWTH 5 DAYS Performed at [facility], [place] [address], [place], [place] [address]    Report Status 04/24/2020 FINAL  Final  MRSA PCR Screening     Status: None   Collection Time: 04/19/20  8:56 AM   Specimen: Nasopharyngeal  Result Value Ref Range Status   MRSA by PCR NEGATIVE NEGATIVE Final    Comment:        The GeneXpert MRSA Assay (FDA approved for NASAL specimens only), is one component of a comprehensive MRSA colonization surveillance program. It is not intended to diagnose MRSA infection nor to guide or monitor treatment for MRSA infections. Performed at [facility], [place] [address] [place], [place] [address]   Urine culture     Status: None   Collection Time: 04/19/20 11:50 AM   Specimen: In/Out  Cath Urine  Result Value Ref Range Status   Specimen Description IN/OUT CATH URINE  Final   Special Requests NONE  Final   Culture   Final    NO GROWTH Performed at [facility], [place] [address], [place], [place] [address]    Report Status 04/20/2020 FINAL  Final     Labs: BNP (last 3 results) Recent Labs    10/19/19 0235 03/26/20 0441  BNP 123.3* 295.6*   Basic Metabolic Panel: Recent Labs  Lab 04/19/20 0705 04/19/20 0710 04/19/20 0904 04/20/20 0329 04/21/20 0325 04/23/20 0344 04/24/20 0149  NA 143 141 140 140 140 141 139  K 3.9 3.8 4.0 4.2 4.9 4.2 4.5  CL 103 101  --  105 105 106 103  CO2 29  --   --  _0 GLUCOSE 134* 131*  --  201* 157* 113* 121*  BUN 14 18  --  16 23* 31* 26*  CREATININE 0.99 0.90  --  0.77 0.87 0.73 0.69  CALCIUM 9.5  --   --  8.9 9.3 8.9 9.0  MG  --   --   --   --  2.3  --   --   PHOS  --   --   --   --  3.4  --   --    Liver Function Tests: Recent Labs  Lab 04/19/20 0705  AST 31  ALT 29  ALKPHOS 43  BILITOT 0.6  PROT 6.5  ALBUMIN 4.0   No results for input(s): LIPASE, AMYLASE in the last 168 hours. No results for input(s): AMMONIA in the last 168 hours. CBC: Recent Labs  Lab 04/19/20 0705 04/19/20 0710 04/19/20 0904 04/20/20 0329 04/21/20 0325 04/22/20 0139  WBC 18.8*  --   --  17.2* 17.0* 12.3*  NEUTROABS 14.9*  --   --  16.2* 15.8*  --   HGB 14.1 15.6 12.6* 11.7* 11.3* 12.3*  HCT 44.8 46.0 37.0* 37.8* 36.7* 39.3  MCV 93.1  --   --  92.2 93.9 92.0  PLT 341  --   --  254 284 261   Cardiac Enzymes: No results for input(s): CKTOTAL, CKMB, CKMBINDEX, TROPONINI in the last 168 hours. BNP: Invalid input(s): POCBNP CBG: Recent Labs  Lab 04/23/20 0741 04/23/20 1146 04/23/20 1608 04/23/20 2155 04/24/20 0736  GLUCAP 103* 172* 173* 145* 116*   D-Dimer No results for input(s): DDIMER in the last 72 hours. Hgb A1c No results for input(s): HGBA1C in the last 72 hours. Lipid Profile No results for  input(s): CHOL, HDL, LDLCALC, TRIG, CHOLHDL, LDLDIRECT in the last 72 hours. Thyroid function studies No results for input(s): TSH, T4TOTAL, T3FREE, THYROIDAB in the last 72 hours.  Invalid input(s): FREET3 Anemia work up No results for input(s): VITAMINB12, FOLATE, FERRITIN, TIBC, IRON, RETICCTPCT in the last 72 hours. Urinalysis    Component Value Date/Time   COLORURINE STRAW (A) 04/19/2020 1150   APPEARANCEUR CLEAR 04/19/2020 1150   LABSPEC 1.006 04/19/2020 1150   PHURINE 7.0 04/19/2020 1150   GLUCOSEU 150 (A) 04/19/2020 1150   HGBUR NEGATIVE 04/19/2020 1150   BILIRUBINUR NEGATIVE 04/19/2020 1150   BILIRUBINUR negative 09/25/2018 1432   KETONESUR NEGATIVE 04/19/2020 1150   PROTEINUR NEGATIVE 04/19/2020 1150   UROBILINOGEN 0.2 02/19/2019 1029   NITRITE NEGATIVE 04/19/2020 1150   LEUKOCYTESUR NEGATIVE 04/19/2020 1150   Sepsis Labs Invalid input(s): PROCALCITONIN,  WBC,  LACTICIDVEN Microbiology Recent Results (from the past 240 hour(s))  Resp Panel by RT-PCR (Flu A&B, Covid) Nasopharyngeal Swab     Status: None   Collection Time: 04/19/20  6:52 AM   Specimen: Nasopharyngeal Swab; Nasopharyngeal(NP) swabs in vial transport medium  Result Value Ref Range Status   SARS Coronavirus 2 by RT PCR NEGATIVE NEGATIVE Final    Comment: (NOTE) SARS-CoV-2 target nucleic acids are NOT DETECTED.  The SARS-CoV-2 RNA is generally detectable in upper respiratory specimens during the acute phase of infection. The lowest concentration of SARS-CoV-2 viral copies this assay can detect is 138 copies/mL. A negative result does not preclude SARS-Cov-2 infection and should not be used as the sole basis for treatment or other patient management decisions. A negative result may occur with  improper specimen collection/handling, submission of specimen other than nasopharyngeal swab, presence of viral mutation(s) within the areas targeted by this assay, and inadequate number of viral copies(<138  copies/mL). A negative result must be combined with clinical observations, patient history, and epidemiological information. The expected result is Negative.  Fact Sheet for Patients:  EntrepreneurPulse.com.au  Fact Sheet for Healthcare Providers:  IncredibleEmployment.be  This test is no t yet approved or cleared by the [place] FDA and  has been authorized for detection and/or diagnosis of SARS-CoV-2 by FDA under an Emergency Use Authorization (EUA). This EUA will remain  in effect (meaning this test can be used) for the duration of the COVID-19 declaration under Section 564(b)(1) of the Act, 21 U.S.C.section 360bbb-3(b)(1), unless the authorization is terminated  or revoked sooner.       Influenza A by PCR NEGATIVE NEGATIVE Final   Influenza B by PCR NEGATIVE NEGATIVE Final    Comment: (NOTE) The Xpert Xpress SARS-CoV-2/FLU/RSV plus assay is intended as an aid in the diagnosis of influenza from Nasopharyngeal swab specimens and should not be used  as a sole basis for treatment. Nasal washings and aspirates are unacceptable for Xpert Xpress SARS-CoV-2/FLU/RSV testing.  Fact Sheet for Patients: EntrepreneurPulse.com.au  Fact Sheet for Healthcare Providers: IncredibleEmployment.be  This test is not yet approved or cleared by the [place] FDA and has been authorized for detection and/or diagnosis of SARS-CoV-2 by FDA under an Emergency Use Authorization (EUA). This EUA will remain in effect (meaning this test can be used) for the duration of the COVID-19 declaration under Section 564(b)(1) of the Act, 21 U.S.C. section 360bbb-3(b)(1), unless the authorization is terminated or revoked.  Performed at [facility], [place] [address], [place], [place] [address]   Blood culture (routine x 2)     Status: None   Collection Time: 04/19/20  6:53 AM   Specimen: BLOOD RIGHT HAND  Result Value Ref  Range Status   Specimen Description BLOOD RIGHT HAND  Final   Special Requests   Final    BOTTLES DRAWN AEROBIC AND ANAEROBIC Blood Culture results may not be optimal due to an inadequate volume of blood received in culture bottles   Culture   Final    NO GROWTH 5 DAYS Performed at [facility], [place] [address] [place], [place] [address]    Report Status 04/24/2020 FINAL  Final  Blood culture (routine x 2)     Status: None   Collection Time: 04/19/20  6:58 AM   Specimen: BLOOD LEFT HAND  Result Value Ref Range Status   Specimen Description BLOOD LEFT HAND  Final   Special Requests   Final    BOTTLES DRAWN AEROBIC AND ANAEROBIC Blood Culture adequate volume   Culture   Final    NO GROWTH 5 DAYS Performed at [facility], [place] [address], [place], [place] [address]    Report Status 04/24/2020 FINAL  Final  MRSA PCR Screening     Status: None   Collection Time: 04/19/20  8:56 AM   Specimen: Nasopharyngeal  Result Value Ref Range Status   MRSA by PCR NEGATIVE NEGATIVE Final    Comment:        The GeneXpert MRSA Assay (FDA approved for NASAL specimens only), is one component of a comprehensive MRSA colonization surveillance program. It is not intended to diagnose MRSA infection nor to guide or monitor treatment for MRSA infections. Performed at [facility], [place] [address], [place], [name] [address]   Urine culture     Status: None   Collection Time: 04/19/20 11:50 AM   Specimen: In/Out Cath Urine  Result Value Ref Range Status   Specimen Description IN/OUT CATH URINE  Final   Special Requests NONE  Final   Culture   Final    NO GROWTH Performed at [facility], [place] [address], [place], [place] [address]    Report Status 04/20/2020 FINAL  Final     Time coordinating discharge: Over 30 minutes  SIGNED:   [name], MD  [facility] 04/24/2020, 9:15 AM  If 7PM-7AM, please contact night-coverage [email or web address]

## 2020-04-24 NOTE — TOC Progression Note (Addendum)
Transition of Care [facility]) - Progression Note    Patient Details  Name: MANSOUR BALBOA MRN: [id number] Date of Birth: Mar 07, 1960  Transition of Care [facility]) CM/SW Contact  Joanne Chars, LCSW Phone Number: 04/24/2020, 9:32 AM  Clinical Narrative:   [place] referral made to [facility].  [facility] reports they no longer have contract with Medicaid/Healthy [employer].  [facility] not in network with [employer].    Expected Discharge Plan: [place] Barriers to Discharge: Continued Medical Work up  Expected Discharge Plan and Services Expected Discharge Plan: [place] In-house Referral: Clinical Social Work Discharge Planning Services: CM Consult Post Acute Care Choice: [place] arrangements for the past 2 months: Single Family Home Expected Discharge Date: 04/24/20               DME Arranged: N/A DME Agency: NA                   Social Determinants of Health (SDOH) Interventions    Readmission Risk Interventions Readmission Risk Prevention Plan 03/28/2020 04/26/2019  Transportation Screening Complete Complete  PCP or Specialist Appt within 3-5 Days Not Complete Complete  Not Complete comments Pt had existing appt with [name] on 04/14/20. -  [place] or [place] Complete Complete  Social Work Consult for [place] Planning/Counseling Complete Complete  Palliative Care Screening Not Applicable Not Applicable  Medication Review Press photographer) - Complete  Some recent data might be hidden

## 2020-04-24 NOTE — Discharge Instructions (Signed)
Hospital-Acquired Pneumonia  Hospital-acquired pneumonia is a lung infection that a person can get when in a health care setting or during certain procedures. The infection causes air pouches (sacs) inside the lungs to fill with pus or fluid. Hospital-acquired pneumonia is usually caused by bacteria that are common in health care settings. These bacteria may not be killed by (may be resistant to) some antibiotic medicines. What are the causes? This condition is caused by bacteria that get into your lungs. You can get this condition if you:  Breathe in droplets from an infected person's cough or sneeze.  Touch something that an infected person coughed or sneezed on and then touch your mouth, nose, or eyes.  Have a bacterial infection somewhere else in your body, which spread to your lungs through your blood. What increases the risk? The following factors may make you more likely to develop this condition:  Having a disease that weakens your body's defense system (immune system) or your ability to cough out germs.  Being older than age 63.  Having trouble swallowing.  Using a feeding or breathing tube, or having an IV inserted in a vein.  Having been in an intensive care unit (ICU).  Living in a long-term care facility, such as a nursing home.  Having been in the hospital for 2 or more days in the past 3 months.  Having had your kidneys filtered through hemodialysis in the past 30 days. What are the signs or symptoms? Symptoms of this condition include:  Fever.  Chills.  Cough.  Shortness of breath.  Making crackling or whistling sounds when you breathe (wheezing). How is this diagnosed? This condition may be diagnosed based on:  Your symptoms.  A chest X-ray.  A measurement of the amount of oxygen in your blood.  Laboratory testing. How is this treated? This condition is treated with antibiotics. Your health care provider may take a sample of cells (culture) from  your throat to find out what type of bacteria is in your lungs. He or she may change your antibiotic based on the results.  You may need to be treated at the hospital if you have bacteria in your blood, trouble breathing, or a low oxygen level. At the hospital, you will be given antibiotics through an IV. You may also be given oxygen or breathing treatments. Follow these instructions at home: Activity  Rest as told by your health care provider.  Return to your normal activities as told by your health care provider. Ask your health care provider what activities are safe for you. Lifestyle  Do not use any products that contain nicotine or tobacco, such as cigarettes, e-cigarettes, and chewing tobacco. If you need help quitting, ask your health care provider.  If you drink alcohol: ? Limit how much you use to:  0-1 drink a day for women who are not pregnant.  0-2 drinks a day for men. ? Be aware of how much alcohol is in your drink. In the [place] one drink equals one 12 oz bottle of beer (355 mL), one 5 oz glass of wine (148 mL), or one 1 oz glass of hard liquor (44 mL). General instructions  Take over-the-counter and prescription medicines as told by your health care provider. Finish all antibiotic medicine even when you start to feel better.  Drink enough fluid to keep your urine pale yellow.  Keep all follow-up visits as told by your health care provider. This is important.   How is this prevented? Actions  that I can take To lower your risk of getting this condition again:  Do not smoke. This includes e-cigarettes.  Do not drink too much alcohol.  Keep your immune system healthy by eating well, drinking fluids, and getting enough sleep.  Get a flu shot every year (annually), and get a pneumonia shot if: ? You are older than age 42. ? You smoke. This includes e-cigarettes. ? You have a long-lasting (chronic) condition such as lung disease.  Exercise your lungs by taking deep  breaths, walking, and using an incentive spirometer as directed.  Practice good hygiene and ask others to practice good hygiene. ? Wash your hands often for at least 20 seconds with soap and water. If you cannot use soap and water, use an alcohol-based hand sanitizer. ? Make sure your health care providers wash their hands. Ask them to wash their hands if you do not see them doing so. ? When you are in a health care facility, avoid touching your eyes, nose, and mouth. ? Avoid touching any surface near where people have coughed or sneezed. ? Stand away from sick people when they are coughing or sneezing. ? Wear a mask if you cannot avoid exposure to people who are sick. ? Clean all surfaces often with a cleanser that kills germs (disinfectant), especially if someone is sick at home or work.   Precautions of my health care team Hospitals, nursing homes, and other health care facilities take special care to try to prevent hospital-acquired pneumonia. To do this, your health care team may:  Clean their hands for at least 20 seconds with soap and water or with alcohol-based hand sanitizer before and after caring for sick people.  Wear gloves or masks during treatment.  Sanitize medical instruments, tubes, other equipment, and surfaces in hospital or clinic rooms.  Raise the head of your hospital bed so you are not lying flat. The head of the bed may be raised 30 degrees or more.  Have you sit up and move around as soon as possible after surgery.  Insert a breathing tube only if needed. If you have a breathing tube, they will: ? Clean the inside of your mouth regularly. ? Remove the breathing tube as soon as it is no longer needed. Contact a health care provider if:  Your symptoms do not get better or they get worse.  Your symptoms come back after you have finished taking your antibiotics. Get help right away if you have:  Trouble breathing.  Confusion or trouble thinking. These  symptoms may represent a serious problem that is an emergency. Do not wait to see if the symptoms will go away. Get medical help right away. Call your local emergency services (911 in the [place] Do not drive yourself to the hospital. Summary  Hospital-acquired pneumonia is a lung infection usually caused by bacteria common in health care settings.  Take over-the-counter and prescription medicines as told by your health care provider. Finish all antibiotic medicine even when you start to feel better  Do not smoke.  Eat well, get plenty of rest and fluids, wash your hands often, and keep all follow-up visits as told by your health care provider. This information is not intended to replace advice given to you by your health care provider. Make sure you discuss any questions you have with your health care provider. Document Revised: 12/08/2018 Document Reviewed: 12/08/2018 Elsevier Patient Education  [place].

## 2020-04-25 ENCOUNTER — Other Ambulatory Visit: Payer: Self-pay

## 2020-04-25 ENCOUNTER — Other Ambulatory Visit: Payer: Self-pay | Admitting: *Deleted

## 2020-04-25 ENCOUNTER — Telehealth: Payer: Self-pay | Admitting: *Deleted

## 2020-04-25 ENCOUNTER — Telehealth: Payer: Self-pay

## 2020-04-25 NOTE — Patient Instructions (Signed)
Visit Information  James [name] was given information about Medicaid Managed Care team care coordination services as a part of their Healthy [facility] Medicaid benefit. James [name] verbally consented to engagement with the [facility] Managed Care team.   For questions related to your Healthy [facility] health plan, please call: [phone] or visit the homepage here: [email or web address]  If you would like to schedule transportation through your Healthy [facility] plan, please call the following number at least 2 days in advance of your appointment: [phone]   Call the [place] at [phone], at any time, 24 hours a day, 7 days a week. If you are in danger or need immediate medical attention call 911.  James [name] - following are the goals we discussed in your visit today:  Goals Addressed            This Visit's Progress   . Stop or Cut Down Drinking Alcohol       Timeframe:  Long-Range Goal Priority:  High Start Date:   04/25/20                          Expected End Date:  08/25/20                     Follow Up Date 04/29/20   - avoid people and places where alcohol is used - develop a plan to avoid relapse  - think about counseling to assist in quiting   Why is this important?   To stop drinking it is important to have support from a person or group of people who you can count on.  You will also need to think about the things that make you feel like using alcohol; then plan for how to handle them.           Please see education materials related to alcohol abuse provided by MyChart link. and as Ad[employer] account planner.     Alcohol Abuse and Dependence Information, Adult Alcohol is a widely available drug. People drink alcohol in different amounts. People who drink alcohol very often and in large amounts often have problems during and after drinking. They may develop what is called an alcohol use disorder. There are two  main types of alcohol use disorders:  Alcohol abuse. This is when you use alcohol too much or too often. You may use alcohol to make yourself feel happy or to reduce stress. You may have a hard time setting a limit on the amount you drink.  Alcohol dependence. This is when you use alcohol consistently for a period of time, and your body changes as a result. This can make it hard to stop drinking because you may start to feel sick or feel different when you do not use alcohol. These symptoms are known as withdrawal. How can alcohol abuse and dependence affect me? Alcohol abuse and dependence can have a negative effect on your life. Drinking too much can lead to addiction. You may feel like you need alcohol to function normally. You may drink alcohol before work in the morning, during the day, or as soon as you get home from work in the evening. These actions can result in:  Poor work performance.  Job loss.  Financial problems.  Car crashes or criminal charges from driving after drinking alcohol.  Problems in your relationships with friends and family.  Losing the trust and respect of coworkers, friends, and family. Drinking heavily  over a long period of time can permanently damage your body and brain, and can cause lifelong health issues, such as:  Damage to your liver or pancreas.  Heart problems, high blood pressure, or stroke.  Certain cancers.  Decreased ability to fight infections.  Brain or nerve damage.  Depression.  Early (premature) death. If you are careless or you crave alcohol, it is easy to drink more than your body can handle (overdose). Alcohol overdose is a serious situation that requires hospitalization. It may lead to permanent injuries or death. What can increase my risk?  Having a family history of alcohol abuse.  Having depression or other mental health conditions.  Beginning to drink at an early age.  Binge drinking often.  Experiencing trauma,  stress, and an unstable home life during childhood.  Spending time with people who drink often. What actions can I take to prevent or manage alcohol abuse and dependence?  Do not drink alcohol if: ? Your health care provider tells you not to drink. ? You are pregnant, may be pregnant, or are planning to become pregnant.  If you drink alcohol: ? Limit how much you use to:  0-1 drink a day for women.  0-2 drinks a day for men. ? Be aware of how much alcohol is in your drink. In the [place] one drink equals one 12 oz bottle of beer (355 mL), one 5 oz glass of wine (148 mL), or one 1 oz glass of hard liquor (44 mL).  Stop drinking if you have been drinking too much. This can be very hard to do if you are used to abusing alcohol. If you begin to have withdrawal symptoms, talk with your health care provider or a person that you trust. These symptoms may include anxiety, shaky hands, headache, nausea, sweating, or not being able to sleep.  Choose to drink nonalcoholic beverages in social gatherings and places where there may be alcohol. Activity  Spend more time on activities that you enjoy that do not involve alcohol, like hobbies or exercise.  Find healthy ways to cope with stress, such as exercise, meditation, or spending time with people you care about. General information  Talk to your family, coworkers, and friends about supporting you in your efforts to stop drinking. If they drink, ask them not to drink around you. Spend more time with people who do not drink alcohol.  If you think that you have an alcohol dependency problem: ? Tell friends or family about your concerns. ? Talk with your health care provider or another health professional about where to get help. ? Work with a Transport planner and a Regulatory affairs officer. ? Consider joining a support group for people who struggle with alcohol abuse and dependence. Where to find support  Your health care provider.  SMART  Recovery: www.smartrecovery.org Therapy and support groups  Local treatment centers or chemical dependency counselors.  Local AA groups in your community: NicTax.com.pt   Where to find more information  Centers for Disease Control and Prevention: http://www.wolf.info/  National Institute on Alcohol Abuse and Alcoholism: http://www.bradshaw.com/  Alcoholics Anonymous (AA): NicTax.com.pt Contact a health care provider if:  You drank more or for longer than you intended on more than one occasion.  You tried to stop drinking or to cut back on how much you drink, but you were not able to.  You often drink to the point of vomiting or passing out.  You want to drink so badly that you cannot think about anything  else.  You have problems in your life due to drinking, but you continue to drink.  You keep drinking even though you feel anxious, depressed, or have experienced memory loss.  You have stopped doing the things you used to enjoy in order to drink.  You have to drink more than you used to in order to get the effect you want.  You experience anxiety, sweating, nausea, shakiness, and trouble sleeping when you try to stop drinking. Get help right away if:  You have thoughts about hurting yourself or others.  You have serious withdrawal symptoms, including: ? Confusion. ? Racing heart. ? High blood pressure. ? Fever. If you ever feel like you may hurt yourself or others, or have thoughts about taking your own life, get help right away. You can go to your nearest emergency department or call:  Your local emergency services (911 in the [place]  A suicide crisis helpline, such as the [place] at 845-390-3282. This is open 24 hours a day. Summary  Alcohol abuse and dependence can have a negative effect on your life. Drinking too much or too often can lead to addiction.  If you drink alcohol, limit how much you use.  If you are having trouble keeping your drinking under  control, find ways to change your behavior. Hobbies, calming activities, exercise, or support groups can help.  If you feel you need help with changing your drinking habits, talk with your health care provider, a good friend, or a therapist, or go to an [place] group. This information is not intended to replace advice given to you by your health care provider. Make sure you discuss any questions you have with your health care provider. Document Revised: 04/11/2018 Document Reviewed: 02/28/2018 Elsevier Patient Education  [place].   The patient verbalized understanding of instructions provided today and agreed to receive a mailed copy of patient instruction and/or educational materials.  Telephone follow up appointment with Managed Medicaid care management team member scheduled for:04/29/20 @ 1:45pm  [name] RN, BSN [place] RN Care Coordinator   Following is a copy of your plan of care:  Patient Care Plan: COPD (Adult)    Problem Identified: Symptom Exacerbation (COPD)     Long-Range Goal: Symptom Exacerbation Prevented or Minimized   Start Date: 04/09/2020  Expected End Date: 06/09/2020  This Visit's Progress: On track  Recent Progress: On track  Priority: High  Note:   Current Barriers:  [name] Knowledge deficits related to basic COPD self care/management-James [name] was recently admitted for COPD exacerbation and pnuemonia. He reports taking his medications, wearing oxygen continuously, and has a follow up with his PCP on 04/14/20. He does report having difficulty at times swallowing food and liquid, pain in back and legs, and difficulty falling asleep. He is planning to discuss these issues with his PCP. James [name] reports having transportation and plenty of food. He lives alone, and has support from his sister. James [name] attended his follow up with PCP. He was working outside at his home today and reports doing "about the same". He reports increased  difficulty breathing when it is hot outside. He has his medications and oxygen, reports no needs at this time. He is working with [facility] pharmacist for medication management. . Does not contact provider office for questions/concerns  Case Manager Clinical Goal(s):  patient will report using inhalers as prescribed including rinsing mouth after use  patient will engage in lite exercise as  tolerated to build/regain stamina and strength and reduce shortness of breath through activity tolerance  patient will verbalize basic understanding of COPD disease process and self care activities  patient will not be hospitalized for COPD exacerbation as evidenced   Patient will work with [facility] pharmacist for medication management-[facility]  Patient will attend all scheduled appointments Interventions:  . Inter-disciplinary care team collaboration (see longitudinal plan of care)  Provided patient with basic written and verbal COPD education on self care/management/and exacerbation prevention   Provided instruction about proper use of medications used for management of COPD including inhalers  Provided patient with education about the role of exercise in the management of COPD  Advised patient to engage in light exercise as tolerated 3-5 days a week  Provided education about and advised patient to utilize infection prevention strategies to reduce risk of respiratory infection  Patient Goals/Self-Care Activities:  . - avoid outdoors during the warmest part of the day . - avoid second hand smoke . - identify and remove indoor air pollutants . - limit outdoor activity during cold weather . - listen for public air quality announcements every day  . - take prescribed medications as directed . - call PCP with new concerns or questions . - develop a new routine to improve sleep . - get at least 7 to 8 hours of sleep at night . - keep room cool and dark . - practice relaxation or meditation daily  Follow Up Plan:  Telephone follow up appointment with care management team member scheduled for:05/20/20 @ 3:15pm    Patient Care Plan: Medication Management    Problem Identified: Health Promotion or Disease Self-Management (General Plan of Care)     Goal: Medication Management   Note:   Current Barriers:  . Unable to independently monitor therapeutic efficacy . Unable to self administer medications as prescribed . Does not adhere to prescribed medication regimen . Does not maintain contact with provider office . Does not contact provider office for questions/concerns .   Pharmacist Clinical Goal(s):  [name] Over the next 30 days, patient will contact provider office for questions/concerns as evidenced notation of same in electronic health record through collaboration with PharmD and provider.  .   Interventions: . Inter-disciplinary care team collaboration (see longitudinal plan of care) . Comprehensive medication review performed; medication list updated in electronic medical record  @RXCPDIABETES @ @RXCPHYPERTENSION @ @RXCPHYPERLIPIDEMIA @ Health Maintenance  Patient Goals/Self-Care Activities . Over the next 30 days, patient will:  - collaborate with provider on medication access solutions  Follow Up Plan: The care management team will reach out to the patient again over the next 30 days.            Patient Care Plan: Wellness (Adult)    Problem Identified: (RNCM) Alcohol Use (Wellness)     Long-Range Goal: Alcohol Use Managed   Start Date: 04/25/2020  Expected End Date: 08/25/2020  This Visit's Progress: On track  Priority: High  Note:   Current Barriers:   Ineffective Self Health Maintenance-James [name] was admitted on 4/16-4/21. Patient reports it was because of the alcohol. He wants to quit and feels that he can stop drinking on his own. Today he reports having all of his medications, plenty of food and denies any needs at this time. He was discharged with [facility], he is waiting to hear  from the agency.  Does not adhere to provider recommendations re: quit drinking alcohol  Currently UNABLE TO independently self manage needs related to chronic health conditions.  Knowledge Deficits related to short term plan for care coordination needs and long term plans for chronic disease management needs Nurse Case Manager Clinical Goal(s):   patient will work with care management team to address care coordination and chronic disease management needs related to alcohol use   Interventions:   Evaluation of current treatment plan related to alcohol use and quitting and patient's adherence to plan as established by provider.  Advised patient to avoid friends/family/situations that drinking is involved, consider counseling for quitting drinking, reach out to supportive friends/family  Discussed plans with patient for ongoing care management follow up and provided patient with direct contact information for care management team Self Care Activities:  . Patient will self administer medications as prescribed . Patient will attend all scheduled provider appointments . Patient will call pharmacy for medication refills . Patient will call provider office for new concerns or questions Patient Goals: - Follow Up Plan: Telephone follow up appointment with care management team member scheduled for:04/29/20 @ 1:45pm

## 2020-04-25 NOTE — Telephone Encounter (Signed)
Transition Care Management Follow-up Telephone Call  Date of discharge and from where: 04/24/2020 - [facility]  How have you been since you were released from the hospital? pretty good but little weak  Any questions or concerns? No  Items Reviewed:  Did the pt receive and understand the discharge instructions provided? Pt stated  he understood all discharge instructions.  Medications obtained and verified? Yes   Other? No   Any new allergies since your discharge? No   Dietary orders reviewed? No  Do you have support at home?  sister and friends  DME pt was discharged with O2 at home stated he has few tanks and agency already reach out to him   Functional Questionnaire: (I = Independent and D = Dependent) ADLs: I  Bathing/Dressing- I  Meal Prep- I  Eating- I  Maintaining continence- I  Transferring/Ambulation- I  Managing Meds- I  Follow up appointments reviewed:   PCP Hospital f/u appt confirmed? Yes  Scheduled to see [name] on 05/26/2020 @ 14:30pm  [facility] f/u appt confirmed? No    Are transportation arrangements needed? No   If their condition worsens, is the pt aware to call PCP or go to the Emergency Dept.? Made pt aware if condition worsen to be further evaluation in ED. Verbalized understanding.   Was the patient provided with contact information for the PCP's office or ED? Yes  Was to pt encouraged to call back with questions or concerns? Yes

## 2020-04-25 NOTE — Telephone Encounter (Signed)
Transition Care Management Follow-up Telephone Call  Date of discharge and from where: 04/24/2020 - [facility]  How have you been since you were released from the hospital? "Alright, I guess"  Any questions or concerns? No  Items Reviewed:  Did the pt receive and understand the discharge instructions provided? Yes   Medications obtained and verified? Yes   Other? No   Any new allergies since your discharge? No   Dietary orders reviewed? No  Do you have support at home? Sometimes   Functional Questionnaire: (I = Independent and D = Dependent) ADLs: I  Bathing/Dressing- I  Meal Prep- I  Eating- I  Maintaining continence- I  Transferring/Ambulation- I  Managing Meds- I  Follow up appointments reviewed:   PCP Hospital f/u appt confirmed? Yes  Scheduled to see [name] on 05/26/2020 @ 1430.  [facility] f/u appt confirmed? No    Are transportation arrangements needed? No   If their condition worsens, is the pt aware to call PCP or go to the Emergency Dept.? Yes  Was the patient provided with contact information for the PCP's office or ED? Yes  Was to pt encouraged to call back with questions or concerns? Yes

## 2020-04-25 NOTE — Patient Outreach (Signed)
Medicaid Managed Care   Nurse Care Manager Note  04/25/2020 Name:  James [name] MRN:  [id number] DOB:  29-Aug-1960  James [name] is an 60 y.o. year old male who is a primary patient of James Gaskins Burnett Harry, [name].  The [facility] Managed Care Coordination team was consulted for assistance with:    HTN COPD recent hospitalization  James [name] was given information about Medicaid Managed Care Coordination team services today. James [name] agreed to services and verbal consent obtained.  Engaged with patient by telephone for follow up visit in response to provider referral for case management and/or care coordination services.   Assessments/Interventions:  Review of past medical history, allergies, medications, health status, including review of consultants reports, laboratory and other test data, was performed as part of comprehensive evaluation and provision of chronic care management services.  SDOH (Social Determinants of Health) assessments and interventions performed:   Care Plan  Allergies  Allergen Reactions  . Gabapentin Other (See Comments)    hallucinations    Medications Reviewed Today    Reviewed by James [name], CPhT (Pharmacy Technician) on 04/19/20 at [phone]  Med List Status: Complete  Medication Order Taking? Sig Documenting Provider Last Dose Status Informant  Accu-Chek Softclix Lancets lancets [id number] Yes Use as instructed to check blood sugar once daily. James Stain, [name]  Active Self  albuterol (PROVENTIL) (2.5 MG/3ML) 0.083% nebulizer solution [id number] Yes Take 3 mLs (2.5 mg total) by nebulization every 6 (six) hours as needed for wheezing or shortness of breath. James [name] 04/18/2020 Unknown time Active Self  albuterol (VENTOLIN HFA) 108 (90 Base) MCG/ACT inhaler [id number] Yes Inhale 2 puffs into the lungs every 4 (four) hours as needed for wheezing or shortness of breath. [name], James Bison, DO 04/18/2020 Unknown time Active Self  Blood Glucose Monitoring  Suppl (ACCU-CHEK GUIDE ME) w/Device KIT [id number] Yes 1 each by Other route in the morning and at bedtime. Provider, Historical, [name]  Active Self  budesonide-formoterol (SYMBICORT) 160-4.5 MCG/ACT inhaler [id number] Yes INHALE 2 PUFFS INTO THE LUNGS 2 (TWO) TIMES DAILY. James Stain, [name] 04/18/2020 Unknown time Active Self  furosemide (LASIX) 20 MG tablet [id number] Yes TAKE 1 TABLET (20 MG TOTAL) BY MOUTH DAILY AS NEEDED FOR EDEMA (LOWER EXTREMITY).  Patient taking differently: Take 20 mg by mouth daily as needed for edema.   James Stain, [name] 04/18/2020 Unknown time Active Self  glucose blood (TRUE METRIX BLOOD GLUCOSE TEST) test strip [id number] Yes Use as instructed James Stain, [name]  Active Self  guaiFENesin (MUCINEX) 600 MG 12 hr tablet [id number]  TAKE 1 TABLET (600 MG TOTAL) BY MOUTH 2 (TWO) TIMES DAILY.  Patient not taking: No sig reported   James [name]  Active Self  ketoconazole (NIZORAL) 2 % cream [id number]  1 (ONE) APPLICATION TO AFFECTED AREA TWICE A DAY  Patient not taking: No sig reported   James Chick, [name]  Active Self  losartan (COZAAR) 100 MG tablet [id number] Yes TAKE 1 TABLET (100 MG TOTAL) BY MOUTH DAILY.  Patient taking differently: Take 100 mg by mouth daily.   James Stain, [name] Past Week Unknown time Active Self  metFORMIN (GLUCOPHAGE) 1000 MG tablet [id number] Yes Take 1 tablet (1,000 mg total) by mouth 2 (two) times daily with a meal. James Stain, [name] 04/18/2020 Unknown time Active Self  metoprolol succinate (TOPROL-XL) 25 MG 24 hr tablet [id number] Yes TAKE 1 TABLET (25 MG TOTAL) BY MOUTH DAILY.  Patient  taking differently: Take 25 mg by mouth daily.   James Stain, [name] 04/18/2020 0930 Active Self  Multiple Vitamin (MULTIVITAMIN WITH MINERALS) TABS tablet [id number]  Take 1 tablet by mouth daily.  Patient not taking: No sig reported   James [name]  Active Self  pantoprazole (PROTONIX) 40 MG tablet [id number] Yes Take 1 tablet (40 mg total) by  mouth daily. James Stain, [name] 04/18/2020 Unknown time Active Self  predniSONE (DELTASONE) 10 MG tablet [id number] Yes Take 4 tablets daily for 5 days then two daily and stay  Patient taking differently: Take 20 mg by mouth daily with breakfast.   James Stain, [name] 04/18/2020 Unknown time Active Self  Respiratory Therapy Supplies (FLUTTER) DEVI [id number] Yes Use 4 times daily  Patient taking differently: 1 each by Other route See admin instructions. Use 4 times daily   James Stain, [name]  Active Self  Tiotropium Bromide Monohydrate 2.5 MCG/ACT AERS [id number] Yes INHALE 2 PUFFS INTO THE LUNGS DAILY. James Stain, [name] 04/18/2020 Unknown time Active Self          Patient Active Problem List   Diagnosis Date Noted  . Alcohol withdrawal syndrome, with delirium ([place])   . Sepsis ([place]) 04/19/2020  . Lactic acid acidosis 04/19/2020  . HCAP (healthcare-associated pneumonia) 04/19/2020  . Obesity (BMI 30.0-34.9) 04/19/2020  . COPD with acute exacerbation ([place]) 03/26/2020  . Pain of right forearm 12/03/2019  . Bronchiectasis ([place]) 09/24/2019  . Type 2 diabetes mellitus without complication, without long-term current use of insulin ([place]) 07/24/2019  . Other atopic dermatitis 05/01/2019  . Chronic respiratory failure with hypoxia ([place]) 04/23/2019  . Intellectual disability 04/23/2019  . History of alcohol use 11/23/2018  . Hypertension 05/17/2016  . History of tobacco abuse 04/06/2016  . COPD mixed type ([place]) 03/26/2016    Conditions to be addressed/monitored per PCP order:  HTN, COPD, DMII and recent hospitalization  Care Plan : Wellness (Adult)  Updates made by James Montane, RN since 04/25/2020 12:00 AM    Problem: (RNCM) Alcohol Use (Wellness)     Long-Range Goal: Alcohol Use Managed   Start Date: 04/25/2020  Expected End Date: 08/25/2020  This Visit's Progress: On track  Priority: High  Note:   Current Barriers:   Ineffective Self Health Maintenance-James [name] was  admitted on 4/16-4/21. Patient reports it was because of the alcohol. He wants to quit and feels that he can stop drinking on his own. Today he reports having all of his medications, plenty of food and denies any needs at this time. He was discharged with [facility], he is waiting to hear from the agency.  Does not adhere to provider recommendations re: quit drinking alcohol  Currently UNABLE TO independently self manage needs related to chronic health conditions.   Knowledge Deficits related to short term plan for care coordination needs and long term plans for chronic disease management needs Nurse Case Manager Clinical Goal(s):   patient will work with care management team to address care coordination and chronic disease management needs related to alcohol use   Interventions:   Evaluation of current treatment plan related to alcohol use and quitting and patient's adherence to plan as established by provider.  Advised patient to avoid friends/family/situations that drinking is involved, consider counseling for quitting drinking, reach out to supportive friends/family  Discussed plans with patient for ongoing care management follow up and provided patient with direct contact information for care management team Self Care Activities:  . Patient  will self administer medications as prescribed . Patient will attend all scheduled provider appointments . Patient will call pharmacy for medication refills . Patient will call provider office for new concerns or questions Patient Goals: - Follow Up Plan: Telephone follow up appointment with care management team member scheduled for:04/29/20 @ 1:45pm      Follow Up:  Patient agrees to Care Plan and Follow-up.  Plan: The Managed Medicaid care management team will reach out to the patient again over the next 7 days.  Date/time of next scheduled RN care management/care coordination outreach: 04/29/20 @ 1:45pm  [name] RN, [place] RN Care Coordinator

## 2020-04-29 ENCOUNTER — Other Ambulatory Visit: Payer: Self-pay | Admitting: *Deleted

## 2020-04-29 ENCOUNTER — Other Ambulatory Visit: Payer: Self-pay

## 2020-04-29 ENCOUNTER — Telehealth: Payer: Self-pay

## 2020-04-29 NOTE — Patient Outreach (Signed)
Medicaid Managed Care   Nurse Care Manager Note  04/29/2020 Name:  James [name] MRN:  [id number] DOB:  [birth year]  James [name] is an 60 y.o. year old male who is a primary patient of James Gaskins Burnett Harry, MD.  The [facility] Managed Care Coordination team was consulted for assistance with:    COPD DMII  James [name] was given information about Medicaid Managed Care Coordination team services today. James [name] agreed to services and verbal consent obtained.  Engaged with patient by telephone for follow up visit in response to provider referral for case management and/or care coordination services.   Assessments/Interventions:  Review of past medical history, allergies, medications, health status, including review of consultants reports, laboratory and other test data, was performed as part of comprehensive evaluation and provision of chronic care management services.  SDOH (Social Determinants of Health) assessments and interventions performed:   Care Plan  Allergies  Allergen Reactions  . Gabapentin Other (See Comments)    hallucinations    Medications Reviewed Today    Reviewed by Lannette Donath, CPhT (Pharmacy Technician) on 04/19/20 at [phone]  Med List Status: Complete  Medication Order Taking? Sig Documenting Provider Last Dose Status Informant  Accu-Chek Softclix Lancets lancets [id number] Yes Use as instructed to check blood sugar once daily. [name], MD  Active Self  albuterol (PROVENTIL) (2.5 MG/3ML) 0.083% nebulizer solution [id number] Yes Take 3 mLs (2.5 mg total) by nebulization every 6 (six) hours as needed for wheezing or shortness of breath. [name], MD 04/18/2020 Unknown time Active Self  albuterol (VENTOLIN HFA) 108 (90 Base) MCG/ACT inhaler [id number] Yes Inhale 2 puffs into the lungs every 4 (four) hours as needed for wheezing or shortness of breath. Ward, Delice Bison, DO 04/18/2020 Unknown time Active Self  Blood Glucose Monitoring Suppl (ACCU-CHEK GUIDE  ME) w/Device KIT [id number] Yes 1 each by Other route in the morning and at bedtime. [provider]  Active Self  budesonide-formoterol (SYMBICORT) 160-4.5 MCG/ACT inhaler [id number] Yes INHALE 2 PUFFS INTO THE LUNGS 2 (TWO) TIMES DAILY. [name], MD 04/18/2020 Unknown time Active Self  furosemide (LASIX) 20 MG tablet [id number] Yes TAKE 1 TABLET (20 MG TOTAL) BY MOUTH DAILY AS NEEDED FOR EDEMA (LOWER EXTREMITY).  Patient taking differently: Take 20 mg by mouth daily as needed for edema.   [name], MD 04/18/2020 Unknown time Active Self  glucose blood (TRUE METRIX BLOOD GLUCOSE TEST) test strip [id number] Yes Use as instructed [name], MD  Active Self  guaiFENesin (MUCINEX) 600 MG 12 hr tablet [id number]  TAKE 1 TABLET (600 MG TOTAL) BY MOUTH 2 (TWO) TIMES DAILY.  Patient not taking: No sig reported   [name], MD  Active Self  ketoconazole (NIZORAL) 2 % cream [id number]  1 (ONE) APPLICATION TO AFFECTED AREA TWICE A DAY  Patient not taking: No sig reported   [name], MD  Active Self  losartan (COZAAR) 100 MG tablet [id number] Yes TAKE 1 TABLET (100 MG TOTAL) BY MOUTH DAILY.  Patient taking differently: Take 100 mg by mouth daily.   [name], MD Past Week Unknown time Active Self  metFORMIN (GLUCOPHAGE) 1000 MG tablet [id number] Yes Take 1 tablet (1,000 mg total) by mouth 2 (two) times daily with a meal. [name], MD 04/18/2020 Unknown time Active Self  metoprolol succinate (TOPROL-XL) 25 MG 24 hr tablet [id number] Yes TAKE 1 TABLET (25 MG TOTAL) BY MOUTH DAILY.  Patient taking differently:  Take 25 mg by mouth daily.   [name], MD 04/18/2020 0930 Active Self  Multiple Vitamin (MULTIVITAMIN WITH MINERALS) TABS tablet [id number]  Take 1 tablet by mouth daily.  Patient not taking: No sig reported   [name], MD  Active Self  pantoprazole (PROTONIX) 40 MG tablet [id number] Yes Take 1 tablet (40 mg total) by mouth daily. [name], MD 04/18/2020 Unknown time Active Self  predniSONE (DELTASONE) 10 MG tablet [id number] Yes Take 4 tablets daily for 5 days then two daily and stay  Patient taking differently: Take 20 mg by mouth daily with breakfast.   [name], MD 04/18/2020 Unknown time Active Self  Respiratory Therapy Supplies (FLUTTER) DEVI [id number] Yes Use 4 times daily  Patient taking differently: 1 each by Other route See admin instructions. Use 4 times daily   [name], MD  Active Self  Tiotropium Bromide Monohydrate 2.5 MCG/ACT AERS [id number] Yes INHALE 2 PUFFS INTO THE LUNGS DAILY. [name], MD 04/18/2020 Unknown time Active Self          Patient Active Problem List   Diagnosis Date Noted  . Alcohol withdrawal syndrome, with delirium ([place])   . Sepsis ([place]) 04/19/2020  . Lactic acid acidosis 04/19/2020  . HCAP (healthcare-associated pneumonia) 04/19/2020  . Obesity (BMI 30.0-34.9) 04/19/2020  . COPD with acute exacerbation ([place]) 03/26/2020  . Pain of right forearm 12/03/2019  . Bronchiectasis ([place]) 09/24/2019  . Type 2 diabetes mellitus without complication, without long-term current use of insulin ([place]) 07/24/2019  . Other atopic dermatitis 05/01/2019  . Chronic respiratory failure with hypoxia ([place]) 04/23/2019  . Intellectual disability 04/23/2019  . History of alcohol use 11/23/2018  . Hypertension 05/17/2016  . History of tobacco abuse 04/06/2016  . COPD mixed type ([place]) 03/26/2016    Conditions to be addressed/monitored per PCP order:  COPD and DMII  Care Plan : COPD (Adult)  Updates made by [name], RN since 04/29/2020 12:00 AM    Problem: Symptom Exacerbation (COPD)     Long-Range Goal: Symptom Exacerbation Prevented or Minimized   Start Date: 04/09/2020  Expected End Date: 06/09/2020  Recent Progress: On track  Priority: High  Note:   Current Barriers:  [name] Knowledge deficits related to basic COPD self care/management-[name] was recently admitted  for COPD exacerbation and pnuemonia. He reports taking his medications, wearing oxygen continuously, and has a follow up with his PCP on 04/14/20. He does report having difficulty at times swallowing food and liquid, pain in back and legs, and difficulty falling asleep. He is planning to discuss these issues with his PCP. Mr. Petteway reports having transportation and plenty of food. He lives alone, and has support from his sister. Mr. Glassburn attended his follow up with PCP. He was working outside at his home today and reports doing "about the same". He reports increased difficulty breathing when it is hot outside. He has his medications and oxygen, reports no needs at this time. He is working with [facility] pharmacist for medication management.Update-Mr. Hofman reports his breathing is about the same, worse with the warm temperatures. He is aware of his PCP appointment on 5/23 and has transportation. . Does not contact provider office for questions/concerns  Case Manager Clinical Goal(s):  patient will report using inhalers as prescribed including rinsing mouth after use  patient will engage in lite exercise as tolerated to build/regain stamina and strength and reduce shortness of breath through activity tolerance  patient will verbalize  basic understanding of COPD disease process and self care activities  patient will not be hospitalized for COPD exacerbation as evidenced   Patient will work with [facility] pharmacist for medication management-[facility]  Patient will attend all scheduled appointments Interventions:  . Inter-disciplinary care team collaboration (see longitudinal plan of care)  Advised patient to work with [facility] PT for increased strengthening.   Encouraged to avoid being outside during the middle of the day when it is hot outside  Reviewed upcoming scheduled appointments  Provided RNCM contact information for any healthcare related barriers Patient Goals/Self-Care Activities:  . - avoid outdoors  during the warmest part of the day . - avoid second hand smoke . - identify and remove indoor air pollutants . - limit outdoor activity during cold weather . - listen for public air quality announcements every day  . - take prescribed medications as directed . - call PCP with new concerns or questions . - develop a new routine to improve sleep . - get at least 7 to 8 hours of sleep at night . - keep room cool and dark . - practice relaxation or meditation daily  . - call PCP with new concerns or questions . - take all medications as directed Follow Up Plan: Telephone follow up appointment with care management team member scheduled for:05/20/20 @ 3:15pm    Care Plan : Wellness (Adult)  Updates made by [name], RN since 04/29/2020 12:00 AM    Problem: (RNCM) Alcohol Use (Wellness)     Long-Range Goal: Alcohol Use Managed   Start Date: 04/25/2020  Expected End Date: 08/25/2020  This Visit's Progress: On track  Recent Progress: On track  Priority: High  Note:   Current Barriers:   Ineffective Self Health Maintenance-Mr. Mcglory was admitted on 4/16-4/21. Patient reports it was because of the alcohol. He wants to quit and feels that he can stop drinking on his own. Today he reports having all of his medications, plenty of food and denies any needs at this time. He was discharged with [facility], he is waiting to hear from the agency. Update-Lori for [facility] agency contacted Mr. Mazzoni today and will call him tomorrow to "ask questions". He reports doing well and has not drank any alcohol since discharge. He is interested in therapy if they can come to his home.  Does not adhere to provider recommendations re: quit drinking alcohol  Currently UNABLE TO independently self manage needs related to chronic health conditions.   Knowledge Deficits related to short term plan for care coordination needs and long term plans for chronic disease management needs Nurse Case Manager Clinical Goal(s):    patient will work with care management team to address care coordination and chronic disease management needs related to alcohol use   Interventions:   Evaluation of current treatment plan related to alcohol use and quitting and patient's adherence to plan as established by provider.  Advised patient to avoid friends/family/situations that drinking is involved, consider counseling for quitting drinking, reach out to supportive friends/family  Discussed plans with patient for ongoing care management follow up and provided patient with direct contact information for care management team  Collaborate with Mickie Hillier for therapy options Self Care Activities:  . Patient will self administer medications as prescribed . Patient will attend all scheduled provider appointments . Patient will call pharmacy for medication refills . Patient will call provider office for new concerns or questions Patient Goals: - avoid people and places where alcohol is used -  develop a plan to avoid relapse  - think about counseling to assist in [place] - work with Ubaldo Glassing for therapy options Follow Up Plan: Telephone follow up appointment with care management team member scheduled for:05/20/20 @ 3:15pm      Follow Up:  Patient agrees to Care Plan and Follow-up.  Plan: The Managed Medicaid care management team will reach out to the patient again over the next 14 days.  Date/time of next scheduled RN care management/care coordination outreach: 05/20/20 @ 3:15pm  [name] RN, [place] RN Care Coordinator

## 2020-04-29 NOTE — Patient Instructions (Signed)
Visit Information  James [name] was given information about Medicaid Managed Care team care coordination services as a part of their Healthy [facility] Medicaid benefit. James [name] verbally consented to engagement with the [facility] Managed Care team.   For questions related to your Healthy [facility] health plan, please call: [phone] or visit the homepage here: [email or web address]  If you would like to schedule transportation through your Healthy [facility] plan, please call the following number at least 2 days in advance of your appointment: [phone]   Call the [place] at [phone], at any time, 24 hours a day, 7 days a week. If you are in danger or need immediate medical attention call 911.  James [name] - following are the goals we discussed in your visit today:  Goals Addressed            This Visit's Progress   . Stop or Cut Down Drinking Alcohol       Timeframe:  Long-Range Goal Priority:  High Start Date:   04/25/20                          Expected End Date:  08/25/20                     Follow Up Date 05/20/20   - avoid people and places where alcohol is used - develop a plan to avoid relapse  - think about counseling to assist in [place] - work with Ubaldo Glassing for therapy options   Why is this important?   To stop drinking it is important to have support from a person or group of people who you can count on.  You will also need to think about the things that make you feel like using alcohol; then plan for how to handle them.        . Track and Manage My Triggers-COPD       Timeframe:  Long-Range Goal Priority:  High Start Date:  04/09/20                           Expected End Date:   06/09/20                    Follow Up Date 05/20/20   - avoid outdoors during the warmest part of the day - avoid second hand smoke - identify and remove indoor air pollutants - limit outdoor activity during cold  weather - listen for public air quality announcements every day  - take prescribed medications as directed - call PCP with new concerns or questions - take all medications as directed   Why is this important?    Triggers are activities or things, like tobacco smoke or cold weather, that make your COPD (chronic obstructive pulmonary disease) flare-up.   Knowing these triggers helps you plan how to stay away from them.   When you cannot remove them, you can learn how to manage them.            Please see education materials related to COPD provided as print materials.     Chronic Obstructive Pulmonary Disease Exacerbation Chronic obstructive pulmonary disease (COPD) is a long-term (chronic) lung problem. In COPD, the flow of air from the lungs is limited. COPD exacerbations are times that breathing gets worse and you need more than your normal treatment. Without treatment, they can be life threatening.  If they happen often, your lungs can become more damaged. If your COPD gets worse, your doctor may treat you with:  Medicines.  Oxygen.  Different ways to clear your airway, such as using a mask. Follow these instructions at home: Medicines  Take over-the-counter and prescription medicines only as told by your doctor.  If you take an antibiotic or steroid medicine, do not stop taking the medicine even if you start to feel better.  Keep up with shots (vaccinations) as told by your doctor. Be sure to get a yearly (annual) flu shot. Lifestyle  Do not smoke. If you need help quitting, ask your doctor.  Eat healthy foods.  Exercise regularly.  Get plenty of sleep.  Avoid tobacco smoke and other things that can bother your lungs.  Wash your hands often with soap and water. This will help keep you from getting an infection. If you cannot use soap and water, use hand sanitizer.  During flu season, avoid areas that are crowded with people. General instructions  Drink enough  fluid to keep your pee (urine) clear or pale yellow. Do not do this if your doctor has told you not to.  Use a cool mist machine (vaporizer).  If you use oxygen or a machine that turns medicine into a mist (nebulizer), continue to use it as told.  Follow all instructions for rehabilitation. These are steps you can take to make your body work better.  Keep all follow-up visits as told by your doctor. This is important. Contact a doctor if:  Your COPD symptoms get worse than normal. Get help right away if:  You are short of breath and it gets worse.  You have trouble talking.  You have chest pain.  You cough up blood.  You have a fever.  You keep throwing up (vomiting).  You feel weak or you pass out (faint).  You feel confused.  You are not able to sleep because of your symptoms.  You are not able to do daily activities. Summary  COPD exacerbations are times that breathing gets worse and you need more treatment than normal.  COPD exacerbations can be very serious and may cause your lungs to become more damaged.  Do not smoke. If you need help quitting, ask your doctor.  Stay up-to-date on your shots. Get a flu shot every year. This information is not intended to replace advice given to you by your health care provider. Make sure you discuss any questions you have with your health care provider. Document Revised: 12/03/2016 Document Reviewed: 01/26/2016 Elsevier Patient Education  2021 [employer].   The patient verbalized understanding of instructions provided today and agreed to receive a mailed copy of patient instruction and/or educational materials.  Telephone follow up appointment with Managed Medicaid care management team member scheduled for:05/20/20 @ 3:15pm  [name] RN, BSN [place] RN Care Coordinator   Following is a copy of your plan of care:  Patient Care Plan: COPD (Adult)    Problem Identified: Symptom  Exacerbation (COPD)     Long-Range Goal: Symptom Exacerbation Prevented or Minimized   Start Date: 04/09/2020  Expected End Date: 06/09/2020  Recent Progress: On track  Priority: High  Note:   Current Barriers:  [name] Knowledge deficits related to basic COPD self care/management-James [name] was recently admitted for COPD exacerbation and pnuemonia. He reports taking his medications, wearing oxygen continuously, and has a follow up with his PCP on 04/14/20. He does report having  difficulty at times swallowing food and liquid, pain in back and legs, and difficulty falling asleep. He is planning to discuss these issues with his PCP. James [name] reports having transportation and plenty of food. He lives alone, and has support from his sister. James [name] attended his follow up with PCP. He was working outside at his home today and reports doing "about the same". He reports increased difficulty breathing when it is hot outside. He has his medications and oxygen, reports no needs at this time. He is working with [facility] pharmacist for medication management.Update-James [name] reports his breathing is about the same, worse with the warm temperatures. He is aware of his PCP appointment on 5/23 and has transportation. . Does not contact provider office for questions/concerns  Case Manager Clinical Goal(s):  patient will report using inhalers as prescribed including rinsing mouth after use  patient will engage in lite exercise as tolerated to build/regain stamina and strength and reduce shortness of breath through activity tolerance  patient will verbalize basic understanding of COPD disease process and self care activities  patient will not be hospitalized for COPD exacerbation as evidenced   Patient will work with [facility] pharmacist for medication management-Met  Patient will attend all scheduled appointments Interventions:  . Inter-disciplinary care team collaboration (see longitudinal plan of care)  Advised patient to  work with [facility] PT for increased strengthening.   Encouraged to avoid being outside during the middle of the day when it is hot outside  Reviewed upcoming scheduled appointments  Provided RNCM contact information for any healthcare related barriers Patient Goals/Self-Care Activities:  . - avoid outdoors during the warmest part of the day . - avoid second hand smoke . - identify and remove indoor air pollutants . - limit outdoor activity during cold weather . - listen for public air quality announcements every day  . - take prescribed medications as directed . - call PCP with new concerns or questions . - develop a new routine to improve sleep . - get at least 7 to 8 hours of sleep at night . - keep room cool and dark . - practice relaxation or meditation daily  . - call PCP with new concerns or questions . - take all medications as directed Follow Up Plan: Telephone follow up appointment with care management team member scheduled for:05/20/20 @ 3:15pm    Patient Care Plan: Medication Management    Problem Identified: Health Promotion or Disease Self-Management (General Plan of Care)     Goal: Medication Management   Note:   Current Barriers:  . Unable to independently monitor therapeutic efficacy . Unable to self administer medications as prescribed . Does not adhere to prescribed medication regimen . Does not maintain contact with provider office . Does not contact provider office for questions/concerns .   Pharmacist Clinical Goal(s):  [name] Over the next 30 days, patient will contact provider office for questions/concerns as evidenced notation of same in electronic health record through collaboration with PharmD and provider.  .   Interventions: . Inter-disciplinary care team collaboration (see longitudinal plan of care) . Comprehensive medication review performed; medication list updated in electronic medical  record  @RXCPDIABETES @ @RXCPHYPERTENSION @ @RXCPHYPERLIPIDEMIA @ Health Maintenance  Patient Goals/Self-Care Activities . Over the next 30 days, patient will:  - collaborate with provider on medication access solutions  Follow Up Plan: The care management team will reach out to the patient again over the next 30 days.  Patient Care Plan: Wellness (Adult)    Problem Identified: (RNCM) Alcohol Use (Wellness)     Long-Range Goal: Alcohol Use Managed   Start Date: 04/25/2020  Expected End Date: 08/25/2020  This Visit's Progress: On track  Recent Progress: On track  Priority: High  Note:   Current Barriers:   Ineffective Self Health Maintenance-James [name] was admitted on 4/16-4/21. Patient reports it was because of the alcohol. He wants to quit and feels that he can stop drinking on his own. Today he reports having all of his medications, plenty of food and denies any needs at this time. He was discharged with [facility], he is waiting to hear from the agency. Update-Lori for [facility] agency contacted James [name] today and will call him tomorrow to "ask questions". He reports doing well and has not drank any alcohol since discharge. He is interested in therapy if they can come to his home.  Does not adhere to provider recommendations re: quit drinking alcohol  Currently UNABLE TO independently self manage needs related to chronic health conditions.   Knowledge Deficits related to short term plan for care coordination needs and long term plans for chronic disease management needs Nurse Case Manager Clinical Goal(s):   patient will work with care management team to address care coordination and chronic disease management needs related to alcohol use   Interventions:   Evaluation of current treatment plan related to alcohol use and quitting and patient's adherence to plan as established by provider.  Advised patient to avoid friends/family/situations that drinking is involved, consider  counseling for quitting drinking, reach out to supportive friends/family  Discussed plans with patient for ongoing care management follow up and provided patient with direct contact information for care management team  Collaborate with Mickie Hillier for therapy options Self Care Activities:  . Patient will self administer medications as prescribed . Patient will attend all scheduled provider appointments . Patient will call pharmacy for medication refills . Patient will call provider office for new concerns or questions Patient Goals: - avoid people and places where alcohol is used - develop a plan to avoid relapse  - think about counseling to assist in [place] - work with Ubaldo Glassing for therapy options Follow Up Plan: Telephone follow up appointment with care management team member scheduled for:05/20/20 @ 3:15pm

## 2020-04-29 NOTE — Telephone Encounter (Signed)
Spoke with patient's sister [name] and scheduled an in-person Palliative Consult for 05/14/20 @ 9AM  COVID screening was negative. No pets in home. Patient lives alone.  Consent obtained; updated Outlook/Netsmart/Team List and Epic.  Family is aware they may be receiving a call from NP the day before or day of to confirm appointment.

## 2020-04-30 NOTE — Telephone Encounter (Signed)
Call received from [facility] and provided her with authorization for home health services

## 2020-05-02 ENCOUNTER — Other Ambulatory Visit: Payer: Self-pay | Admitting: Licensed Clinical Social Worker

## 2020-05-02 NOTE — Patient Instructions (Signed)
Visit Information  James [name] was given information about Medicaid Managed Care team care coordination services as a part of their Healthy [facility] Medicaid benefit. [name] verbally consented to engagement with the [facility] Managed Care team.   For questions related to your Healthy [facility] health plan, please call: [phone] or visit the homepage here: [email or web address]  If you would like to schedule transportation through your Healthy [facility] plan, please call the following number at least 2 days in advance of your appointment: [phone]   Call the [place] at [phone], at any time, 24 hours a day, 7 days a week. If you are in danger or need immediate medical attention call 911.  The Managed Medicaid care management team will reach out to the patient again over the next 7 days.   Eula Fried, BSW, MSW, [employer] Managed Medicaid LCSW [email or web address] Phone: [phone]

## 2020-05-02 NOTE — Patient Outreach (Signed)
Medicaid Managed Care Social Work Note  05/02/2020 Name:  James [name] MRN:  [id number] DOB:  06/08/1960  James [name] is an 60 y.o. year old male who is a primary patient of [name], MD.  The Medicaid Managed Care Coordination team was consulted for assistance with:  [place] and Resources  [name] was given information about Medicaid Managed CareCoordination services today. James [name] agreed to services and verbal consent obtained.  Engaged with patient  for by telephone forinitial visit in response to referral for case management and/or care coordination services. Patient request a return call from Managed Medicaid LCSW later in the day as he is unable to talk at this moment. Managed Medicaid LCSW attempted to reach patient several times later in the day but without success and mailbox was full so a voice message was unable to be left. Text message sent to patient and appointment will be rescheduled for 05/06/20.   Assessments/Interventions:  Review of past medical history, allergies, medications, health status, including review of consultants reports, laboratory and other test data, was performed as part of comprehensive evaluation and provision of chronic care management services.  SDOH: (Social Determinant of Health) assessments and interventions performed:   Advanced Directives Status:  Not addressed in this encounter.  Care Plan                 Allergies  Allergen Reactions  . Gabapentin Other (See Comments)    hallucinations    Medications Reviewed Today    Reviewed by Lannette Donath, CPhT (Pharmacy Technician) on 04/19/20 at [phone]  Med List Status: Complete  Medication Order Taking? Sig Documenting Provider Last Dose Status Informant  Accu-Chek Softclix Lancets lancets [id number] Yes Use as instructed to check blood sugar once daily. [name], MD  Active Self  albuterol (PROVENTIL) (2.5 MG/3ML) 0.083% nebulizer solution [id number] Yes  Take 3 mLs (2.5 mg total) by nebulization every 6 (six) hours as needed for wheezing or shortness of breath. [name], MD 04/18/2020 Unknown time Active Self  albuterol (VENTOLIN HFA) 108 (90 Base) MCG/ACT inhaler [id number] Yes Inhale 2 puffs into the lungs every 4 (four) hours as needed for wheezing or shortness of breath. Ward, Delice Bison, DO 04/18/2020 Unknown time Active Self  Blood Glucose Monitoring Suppl (ACCU-CHEK GUIDE ME) w/Device KIT [id number] Yes 1 each by Other route in the morning and at bedtime. [provider]  Active Self  budesonide-formoterol (SYMBICORT) 160-4.5 MCG/ACT inhaler [id number] Yes INHALE 2 PUFFS INTO THE LUNGS 2 (TWO) TIMES DAILY. [name], MD 04/18/2020 Unknown time Active Self  furosemide (LASIX) 20 MG tablet [id number] Yes TAKE 1 TABLET (20 MG TOTAL) BY MOUTH DAILY AS NEEDED FOR EDEMA (LOWER EXTREMITY).  Patient taking differently: Take 20 mg by mouth daily as needed for edema.   [name], MD 04/18/2020 Unknown time Active Self  glucose blood (TRUE METRIX BLOOD GLUCOSE TEST) test strip [id number] Yes Use as instructed [name], MD  Active Self  guaiFENesin (MUCINEX) 600 MG 12 hr tablet [id number]  TAKE 1 TABLET (600 MG TOTAL) BY MOUTH 2 (TWO) TIMES DAILY.  Patient not taking: No sig reported   [name], MD  Active Self  ketoconazole (NIZORAL) 2 % cream [id number]  1 (ONE) APPLICATION TO AFFECTED AREA TWICE A DAY  Patient not taking: No sig reported   [name], MD  Active Self  losartan (COZAAR) 100 MG tablet [id number] Yes TAKE 1 TABLET (100  MG TOTAL) BY MOUTH DAILY.  Patient taking differently: Take 100 mg by mouth daily.   [name], MD Past Week Unknown time Active Self  metFORMIN (GLUCOPHAGE) 1000 MG tablet [id number] Yes Take 1 tablet (1,000 mg total) by mouth 2 (two) times daily with a meal. [name], MD 04/18/2020 Unknown time Active Self  metoprolol succinate (TOPROL-XL) 25 MG 24 hr tablet [id number]  Yes TAKE 1 TABLET (25 MG TOTAL) BY MOUTH DAILY.  Patient taking differently: Take 25 mg by mouth daily.   [name], MD 04/18/2020 0930 Active Self  Multiple Vitamin (MULTIVITAMIN WITH MINERALS) TABS tablet [id number]  Take 1 tablet by mouth daily.  Patient not taking: No sig reported   [name], MD  Active Self  pantoprazole (PROTONIX) 40 MG tablet [id number] Yes Take 1 tablet (40 mg total) by mouth daily. [name], MD 04/18/2020 Unknown time Active Self  predniSONE (DELTASONE) 10 MG tablet [id number] Yes Take 4 tablets daily for 5 days then two daily and stay  Patient taking differently: Take 20 mg by mouth daily with breakfast.   [name], MD 04/18/2020 Unknown time Active Self  Respiratory Therapy Supplies (FLUTTER) DEVI [id number] Yes Use 4 times daily  Patient taking differently: 1 each by Other route See admin instructions. Use 4 times daily   [name], MD  Active Self  Tiotropium Bromide Monohydrate 2.5 MCG/ACT AERS [id number] Yes INHALE 2 PUFFS INTO THE LUNGS DAILY. [name], MD 04/18/2020 Unknown time Active Self          Patient Active Problem List   Diagnosis Date Noted  . Alcohol withdrawal syndrome, with delirium ([place])   . Sepsis ([place]) 04/19/2020  . Lactic acid acidosis 04/19/2020  . HCAP (healthcare-associated pneumonia) 04/19/2020  . Obesity (BMI 30.0-34.9) 04/19/2020  . COPD with acute exacerbation ([place]) 03/26/2020  . Pain of right forearm 12/03/2019  . Bronchiectasis ([place]) 09/24/2019  . Type 2 diabetes mellitus without complication, without long-term current use of insulin ([place]) 07/24/2019  . Other atopic dermatitis 05/01/2019  . Chronic respiratory failure with hypoxia ([place]) 04/23/2019  . Intellectual disability 04/23/2019  . History of alcohol use 11/23/2018  . Hypertension 05/17/2016  . History of tobacco abuse 04/06/2016  . COPD mixed type ([place]) 03/26/2016    There are no care plans that you recently modified to  display for this patient.   Plan: The Managed Medicaid care management team will reach out to the patient again over the next 7 days.  Date/time of next scheduled Social Work care management/care coordination outreach: 05/06/20

## 2020-05-04 DIAGNOSIS — J9601 Acute respiratory failure with hypoxia: Secondary | ICD-10-CM | POA: Diagnosis not present

## 2020-05-04 DIAGNOSIS — J9621 Acute and chronic respiratory failure with hypoxia: Secondary | ICD-10-CM | POA: Diagnosis not present

## 2020-05-04 DIAGNOSIS — F79 Unspecified intellectual disabilities: Secondary | ICD-10-CM | POA: Diagnosis not present

## 2020-05-05 ENCOUNTER — Other Ambulatory Visit: Payer: Self-pay

## 2020-05-06 ENCOUNTER — Other Ambulatory Visit: Payer: Self-pay

## 2020-05-06 ENCOUNTER — Telehealth: Payer: Self-pay | Admitting: Licensed Clinical Social Worker

## 2020-05-06 NOTE — Patient Outreach (Signed)
[facility]) Care Management  [facility] Social Work  05/06/2020  James [name] 1960-03-17 [id number]  Encounter Medications:  Outpatient Encounter Medications as of 05/06/2020  Medication Sig  . Accu-Chek Softclix Lancets lancets Use as instructed to check blood sugar once daily.  [name] albuterol (PROVENTIL) (2.5 MG/3ML) 0.083% nebulizer solution Take 3 mLs (2.5 mg total) by nebulization every 6 (six) hours as needed for wheezing or shortness of breath.  [name] albuterol (VENTOLIN HFA) 108 (90 Base) MCG/ACT inhaler Inhale 2 puffs into the lungs every 4 (four) hours as needed for wheezing or shortness of breath.  . Blood Glucose Monitoring Suppl (ACCU-CHEK GUIDE ME) w/Device KIT 1 each by Other route in the morning and at bedtime.  . budesonide-formoterol (SYMBICORT) 160-4.5 MCG/ACT inhaler INHALE 2 PUFFS INTO THE LUNGS 2 (TWO) TIMES DAILY.  . furosemide (LASIX) 20 MG tablet TAKE 1 TABLET (20 MG TOTAL) BY MOUTH DAILY AS NEEDED FOR EDEMA (LOWER EXTREMITY). (Patient taking differently: Take 20 mg by mouth daily as needed for edema.)  . glucose blood (TRUE METRIX BLOOD GLUCOSE TEST) test strip Use as instructed  . guaiFENesin (MUCINEX) 600 MG 12 hr tablet TAKE 1 TABLET (600 MG TOTAL) BY MOUTH 2 (TWO) TIMES DAILY. (Patient not taking: No sig reported)  . ketoconazole (NIZORAL) 2 % cream 1 (ONE) APPLICATION TO AFFECTED AREA TWICE A DAY (Patient not taking: No sig reported)  . losartan (COZAAR) 100 MG tablet TAKE 1 TABLET (100 MG TOTAL) BY MOUTH DAILY. (Patient taking differently: Take 100 mg by mouth daily.)  . metFORMIN (GLUCOPHAGE) 1000 MG tablet Take 1 tablet (1,000 mg total) by mouth 2 (two) times daily with a meal.  . metoprolol succinate (TOPROL-XL) 25 MG 24 hr tablet TAKE 1 TABLET (25 MG TOTAL) BY MOUTH DAILY. (Patient taking differently: Take 25 mg by mouth daily.)  . Multiple Vitamin (MULTIVITAMIN WITH MINERALS) TABS tablet Take 1 tablet by mouth daily. (Patient not taking: No sig  reported)  . pantoprazole (PROTONIX) 40 MG tablet Take 1 tablet (40 mg total) by mouth daily.  . predniSONE (DELTASONE) 10 MG tablet Take 40 mg (4 tabs) daily for 3 days, followed by 30 mg (3 tabs) po daily for 3 days starting 4/25, followed by 20 mg (2 tabs) po daily for 3 days starting 4/28, followed by 10 mg po daily until seen by pulmonologist  . Respiratory Therapy Supplies (FLUTTER) DEVI Use 4 times daily (Patient taking differently: 1 each by Other route See admin instructions. Use 4 times daily)  . Tiotropium Bromide Monohydrate 2.5 MCG/ACT AERS INHALE 2 PUFFS INTO THE LUNGS DAILY.   No facility-administered encounter medications on file as of 05/06/2020.    Functional Status:  In your present state of health, do you have any difficulty performing the following activities: 04/19/2020 10/22/2019  Hearing? N N  Vision? N N  Difficulty concentrating or making decisions? N N  Walking or climbing stairs? N Y  Dressing or bathing? N Y  Doing errands, shopping? N Y  Some recent data might be hidden    Fall/Depression Screening:  PHQ 2/9 Scores 04/14/2020 02/13/2020 12/03/2019 11/06/2019 08/23/2019 07/23/2019 05/01/2019  PHQ - 2 Score 0 2 0 0 1 0 0  PHQ- 9 Score 0 10 0 - 2 0 0    [facility] LCSW completed second outreach attempt today but was unable to reach patient successfully. A HIPPA compliant voice message was unable to be left as mailbox was full. LCSW will reschedule patient's CCM Social Work appointment  if no return call has been made.  Plan:  Follow-up: [facility] LCSW will make additional outreach attempt on 05/09/20.

## 2020-05-07 ENCOUNTER — Ambulatory Visit: Payer: Medicaid Other

## 2020-05-07 ENCOUNTER — Telehealth: Payer: Self-pay | Admitting: Critical Care Medicine

## 2020-05-07 DIAGNOSIS — J449 Chronic obstructive pulmonary disease, unspecified: Secondary | ICD-10-CM

## 2020-05-07 DIAGNOSIS — E119 Type 2 diabetes mellitus without complications: Secondary | ICD-10-CM

## 2020-05-07 NOTE — Telephone Encounter (Signed)
[facility]ze [facility] and would like order for nurse evaluation for DM and education on using his  glucometer

## 2020-05-07 NOTE — Telephone Encounter (Addendum)
[facility]eda Gauze [facility] and [facility] is to use 2 to 4 liters of oxygen. Pt is using 5 Liters  at night and no oxygen in daytime room air 02 about 97%. Please clarify 02 dose at night. Ok to leave vm

## 2020-05-08 ENCOUNTER — Other Ambulatory Visit (HOSPITAL_COMMUNITY): Payer: Self-pay

## 2020-05-08 NOTE — Progress Notes (Signed)
Paramedicine Encounter    Patient ID: James [name], male    DOB: 12/08/1960, 60 y.o.   MRN: [id number]   Patient Care Team: Wright, Patrick E, MD as PCP - General (Pulmonary Disease) O'Neal, Rockholds Thomas, MD as PCP - Cardiology (Cardiology) Robb, Melanie A, RN as Case Manager Kennedy, Nathan K, RPH as Pharmacist (Pharmacist) Joyce, [name] L, LCSW as [facility] Care Management (Licensed Clinical Social Worker)  Patient Active Problem List   Diagnosis Date Noted  . Alcohol withdrawal syndrome, with delirium (HCC)   . Sepsis (HCC) 04/19/2020  . Lactic acid acidosis 04/19/2020  . HCAP (healthcare-associated pneumonia) 04/19/2020  . Obesity (BMI 30.0-34.9) 04/19/2020  . COPD with acute exacerbation (HCC) 03/26/2020  . Pain of right forearm 12/03/2019  . Bronchiectasis (HCC) 09/24/2019  . Type 2 diabetes mellitus without complication, without long-term current use of insulin (HCC) 07/24/2019  . Other atopic dermatitis 05/01/2019  . Chronic respiratory failure with hypoxia (HCC) 04/23/2019  . Intellectual disability 04/23/2019  . History of alcohol use 11/23/2018  . Hypertension 05/17/2016  . History of tobacco abuse 04/06/2016  . COPD mixed type (HCC) 03/26/2016    Current Outpatient Medications:  .  Accu-Chek Softclix Lancets lancets, Use as instructed to check blood sugar once daily., Disp: 100 each, Rfl: 2 .  albuterol (PROVENTIL) (2.5 MG/3ML) 0.083% nebulizer solution, Take 3 mLs (2.5 mg total) by nebulization every 6 (six) hours as needed for wheezing or shortness of breath., Disp: , Rfl:  .  albuterol (VENTOLIN HFA) 108 (90 Base) MCG/ACT inhaler, Inhale 2 puffs into the lungs every 4 (four) hours as needed for wheezing or shortness of breath., Disp: 1 each, Rfl: 0 .  Blood Glucose Monitoring Suppl (ACCU-CHEK GUIDE ME) w/Device KIT, 1 each by Other route in the morning and at bedtime., Disp: , Rfl:  .  budesonide-formoterol (SYMBICORT) 160-4.5 MCG/ACT inhaler,  INHALE 2 PUFFS INTO THE LUNGS 2 (TWO) TIMES DAILY., Disp: 10.2 g, Rfl: 12 .  furosemide (LASIX) 20 MG tablet, TAKE 1 TABLET (20 MG TOTAL) BY MOUTH DAILY AS NEEDED FOR EDEMA (LOWER EXTREMITY). (Patient taking differently: Take 20 mg by mouth daily as needed for edema.), Disp: 30 tablet, Rfl: 0 .  glucose blood (TRUE METRIX BLOOD GLUCOSE TEST) test strip, Use as instructed, Disp: 100 each, Rfl: 12 .  guaiFENesin (MUCINEX) 600 MG 12 hr tablet, TAKE 1 TABLET (600 MG TOTAL) BY MOUTH 2 (TWO) TIMES DAILY. (Patient not taking: No sig reported), Disp: 30 tablet, Rfl: 0 .  ketoconazole (NIZORAL) 2 % cream, 1 (ONE) APPLICATION TO AFFECTED AREA TWICE A DAY (Patient not taking: No sig reported), Disp: 60 g, Rfl: 1 .  losartan (COZAAR) 100 MG tablet, TAKE 1 TABLET (100 MG TOTAL) BY MOUTH DAILY. (Patient taking differently: Take 100 mg by mouth daily.), Disp: 90 tablet, Rfl: 2 .  metFORMIN (GLUCOPHAGE) 1000 MG tablet, Take 1 tablet (1,000 mg total) by mouth 2 (two) times daily with a meal., Disp: 60 tablet, Rfl: 1 .  metoprolol succinate (TOPROL-XL) 25 MG 24 hr tablet, TAKE 1 TABLET (25 MG TOTAL) BY MOUTH DAILY. (Patient taking differently: Take 25 mg by mouth daily.), Disp: 90 tablet, Rfl: 3 .  Multiple Vitamin (MULTIVITAMIN WITH MINERALS) TABS tablet, Take 1 tablet by mouth daily. (Patient not taking: No sig reported), Disp: , Rfl:  .  pantoprazole (PROTONIX) 40 MG tablet, Take 1 tablet (40 mg total) by mouth daily., Disp: 30 tablet, Rfl: 6 .  predniSONE (DELTASONE) 10 MG tablet,   Take 40 mg (4 tabs) daily for 3 days, followed by 30 mg (3 tabs) po daily for 3 days starting 4/25, followed by 20 mg (2 tabs) po daily for 3 days starting 4/28, followed by 10 mg po daily until seen by pulmonologist, Disp: 50 tablet, Rfl: 0 .  Respiratory Therapy Supplies (FLUTTER) DEVI, Use 4 times daily (Patient taking differently: 1 each by Other route See admin instructions. Use 4 times daily), Disp: 1 each, Rfl: 0 .  Tiotropium Bromide  Monohydrate 2.5 MCG/ACT AERS, INHALE 2 PUFFS INTO THE LUNGS DAILY., Disp: 4 g, Rfl: 6 Allergies  Allergen Reactions  . Gabapentin Other (See Comments)    hallucinations      Social History   Socioeconomic History  . Marital status: Divorced    Spouse name: Not on file  . Number of children: Not on file  . Years of education: Not on file  . Highest education level: Not on file  Occupational History  . Occupation: Mechanic  . Occupation: Painter  Tobacco Use  . Smoking status: Former Smoker    Packs/day: 1.50    Years: 40.00    Pack years: 60.00    Types: Cigarettes    Quit date: 2014    Years since quitting: 8.3  . Smokeless tobacco: Current User    Types: Chew  Vaping Use  . Vaping Use: Never used  Substance and Sexual Activity  . Alcohol use: Yes    Alcohol/week: 28.0 standard drinks    Types: 28 Cans of beer per week    Comment: 4 beers a night; + jitters with not drinking, denies DTs or seizures  . Drug use: Yes    Types: Marijuana    Comment: daily; quit using crack and methamphetamine about 5 months ago   . Sexual activity: Not Currently    Partners: Female  Other Topics Concern  . Not on file  Social History Narrative   Lives alone near [place]   Social Determinants of Health   Financial Resource Strain: Not on file  Food Insecurity: Not on file  Transportation Needs: Not on file  Physical Activity: Not on file  Stress: Not on file  Social Connections: Not on file  Intimate Partner Violence: Not on file    Physical Exam      Future Appointments  Date Time Provider Department Center  05/13/2020 11:15 AM [facility] CCC-MM SOCIAL WORKER THN-CCC None  05/14/2020  9:00 AM Eshiet, Livina I, NP ACP-ACP None  05/20/2020  3:15 PM [facility] CCC-MM CARE MANAGER THN-CCC None  05/26/2020  2:30 PM Wright, Patrick E, MD CHW-CHWW None    BP 128/84   Pulse 84   Resp 20   SpO2 96%   Seen pt in the home today. He now has [facility]osp[facility] team. He said  they will be coming out once a week.  Pt reports he is doing ok. He seemed down and depressed just with his overall health and the limitations of not being able to work and do what he wants to do. He has been painting the walls in his house.  He has all of his meds.  Has not been checking his CBG's. I asked him if he remembered how to do it when we reviewed the last couple times I was there, he said yes he knew how to he just hasnt been doing it.  He has a fan inside right now and that's it. He said he had an a/c unit and will be   placing it in window soon.  Lungs ok-lower lobes slightly diminished, but no wheezing, no crackles. He seemed to not be having difficulty breathing today. He did not have the shakes today. He said he has not drank alcohol since he left the hosp.  With the palliative/hospice nursing team coming out weekly, there will not be a need for paramedicine.  Pt will be d/c at this time.    , EMT-Paramedic [phone] [facility]  05/09/20 

## 2020-05-08 NOTE — Patient Outreach (Signed)
[facility]) Care Management  05/08/2020  WAYDEN SCHWERTNER 02-Mar-1960 [id number]  [facility] LCSW appointment rescheduled successfully for 05/13/20.  Eula Fried, BSW, MSW, [employer] Managed Medicaid LCSW [email or web address] Phone: [phone]

## 2020-05-08 NOTE — Telephone Encounter (Signed)
James [name]  I put orders in for this request   [facility] health

## 2020-05-08 NOTE — Telephone Encounter (Signed)
Call returned to [facility], PT[facility] to provide orders for SN.  Message left with call back requested to this CM.  Call placed to [facility] # [phone], spoke to [facility] and provided orders for SN to evaluate and instruct regarding glucometer use.

## 2020-05-09 ENCOUNTER — Telehealth: Payer: Self-pay | Admitting: Critical Care Medicine

## 2020-05-09 ENCOUNTER — Ambulatory Visit: Payer: Self-pay

## 2020-05-09 NOTE — Telephone Encounter (Signed)
Will route to PCP.  FYI

## 2020-05-09 NOTE — Telephone Encounter (Signed)
Copied from [facility] [id number]. Topic: General - Other >> May 09, 2020  1:19 PM Oneta Rack wrote: Caller name: Radovan  Relation to pt: OT from [facility]  Call back number: [phone]    Reason for call:  Patient was unavailable for OT last week and Therapist will be off this week therefore will start OT Eval next week and wanted the PCP to be aware. No need for a follow up call

## 2020-05-13 ENCOUNTER — Other Ambulatory Visit: Payer: Self-pay | Admitting: Licensed Clinical Social Worker

## 2020-05-13 NOTE — Patient Instructions (Signed)
Visit Information  James [name] was given information about Medicaid Managed Care team care coordination services as a part of their Healthy [facility] Medicaid benefit. [name] verbally consented to engagement with the [facility] Managed Care team.   For questions related to your Healthy [facility] health plan, please call: [phone] or visit the homepage here: [email or web address]  If you would like to schedule transportation through your Healthy [facility] plan, please call the following number at least 2 days in advance of your appointment: [phone]   Call the [place] at [phone], at any time, 24 hours a day, 7 days a week. If you are in danger or need immediate medical attention call 911.  Eula Fried, BSW, MSW, [employer] Managed Medicaid LCSW [email or web address] Phone: [phone]

## 2020-05-13 NOTE — Patient Outreach (Addendum)
Medicaid Managed Care Social Work Note  05/13/2020 Name:  James [name] MRN:  [id number] DOB:  07/02/1960  James [name] is an 60 y.o. year old male who is a primary patient of [name], MD.  The Medicaid Managed Care Coordination team was consulted for assistance with:  [name] and Resources Alcohol Abuse  [name] was given information about Medicaid Managed CareCoordination services today. [name] agreed to services and verbal consent obtained.  Engaged with patient  for by telephone forinitial visit in response to referral for case management and/or care coordination services.   Assessments/Interventions:  Review of past medical history, allergies, medications, health status, including review of consultants reports, laboratory and other test data, was performed as part of comprehensive evaluation and provision of chronic care management services.  SDOH: (Social Determinant of Health) assessments and interventions performed: SDOH Interventions   Flowsheet Row Most Recent Value  SDOH Interventions   SDOH Interventions for the Following Domains Alcohol Usage, Depression  Alcohol Brief Interventions/Follow-up Patient Refused  Depression Interventions/Treatment  Patient refuses Treatment      Advanced Directives Status:  Not addressed in this encounter.  Care Plan                 Allergies  Allergen Reactions  . Gabapentin Other (See Comments)    hallucinations    Medications Reviewed Today    Reviewed by Lannette Donath, CPhT (Pharmacy Technician) on 04/19/20 at [phone]  Med List Status: Complete  Medication Order Taking? Sig Documenting Provider Last Dose Status Informant  Accu-Chek Softclix Lancets lancets [id number] Yes Use as instructed to check blood sugar once daily. [name], MD  Active Self  albuterol (PROVENTIL) (2.5 MG/3ML) 0.083% nebulizer solution [id number] Yes Take 3 mLs (2.5 mg total) by nebulization every 6 (six) hours as  needed for wheezing or shortness of breath. [name], MD 04/18/2020 Unknown time Active Self  albuterol (VENTOLIN HFA) 108 (90 Base) MCG/ACT inhaler [id number] Yes Inhale 2 puffs into the lungs every 4 (four) hours as needed for wheezing or shortness of breath. Ward, Delice Bison, DO 04/18/2020 Unknown time Active Self  Blood Glucose Monitoring Suppl (ACCU-CHEK GUIDE ME) w/Device KIT [id number] Yes 1 each by Other route in the morning and at bedtime. [provider]  Active Self  budesonide-formoterol (SYMBICORT) 160-4.5 MCG/ACT inhaler [id number] Yes INHALE 2 PUFFS INTO THE LUNGS 2 (TWO) TIMES DAILY. [name], MD 04/18/2020 Unknown time Active Self  furosemide (LASIX) 20 MG tablet [id number] Yes TAKE 1 TABLET (20 MG TOTAL) BY MOUTH DAILY AS NEEDED FOR EDEMA (LOWER EXTREMITY).  Patient taking differently: Take 20 mg by mouth daily as needed for edema.   [name], MD 04/18/2020 Unknown time Active Self  glucose blood (TRUE METRIX BLOOD GLUCOSE TEST) test strip [id number] Yes Use as instructed [name], MD  Active Self  guaiFENesin (MUCINEX) 600 MG 12 hr tablet [id number]  TAKE 1 TABLET (600 MG TOTAL) BY MOUTH 2 (TWO) TIMES DAILY.  Patient not taking: No sig reported   [name], MD  Active Self  ketoconazole (NIZORAL) 2 % cream [id number]  1 (ONE) APPLICATION TO AFFECTED AREA TWICE A DAY  Patient not taking: No sig reported   [name], MD  Active Self  losartan (COZAAR) 100 MG tablet [id number] Yes TAKE 1 TABLET (100 MG TOTAL) BY MOUTH DAILY.  Patient taking differently: Take 100 mg by mouth daily.   [name], MD Past  Week Unknown time Active Self  metFORMIN (GLUCOPHAGE) 1000 MG tablet [id number] Yes Take 1 tablet (1,000 mg total) by mouth 2 (two) times daily with a meal. [name], MD 04/18/2020 Unknown time Active Self  metoprolol succinate (TOPROL-XL) 25 MG 24 hr tablet [id number] Yes TAKE 1 TABLET (25 MG TOTAL) BY MOUTH DAILY.  Patient taking  differently: Take 25 mg by mouth daily.   [name], MD 04/18/2020 0930 Active Self  Multiple Vitamin (MULTIVITAMIN WITH MINERALS) TABS tablet [id number]  Take 1 tablet by mouth daily.  Patient not taking: No sig reported   [name], MD  Active Self  pantoprazole (PROTONIX) 40 MG tablet [id number] Yes Take 1 tablet (40 mg total) by mouth daily. [name], MD 04/18/2020 Unknown time Active Self  predniSONE (DELTASONE) 10 MG tablet [id number] Yes Take 4 tablets daily for 5 days then two daily and stay  Patient taking differently: Take 20 mg by mouth daily with breakfast.   [name], MD 04/18/2020 Unknown time Active Self  Respiratory Therapy Supplies (FLUTTER) DEVI [id number] Yes Use 4 times daily  Patient taking differently: 1 each by Other route See admin instructions. Use 4 times daily   [name], MD  Active Self  Tiotropium Bromide Monohydrate 2.5 MCG/ACT AERS [id number] Yes INHALE 2 PUFFS INTO THE LUNGS DAILY. [name], MD 04/18/2020 Unknown time Active Self          Patient Active Problem List   Diagnosis Date Noted  . Alcohol withdrawal syndrome, with delirium ([place])   . Sepsis ([place]) 04/19/2020  . Lactic acid acidosis 04/19/2020  . HCAP (healthcare-associated pneumonia) 04/19/2020  . Obesity (BMI 30.0-34.9) 04/19/2020  . COPD with acute exacerbation ([place]) 03/26/2020  . Pain of right forearm 12/03/2019  . Bronchiectasis ([place]) 09/24/2019  . Type 2 diabetes mellitus without complication, without long-term current use of insulin ([place]) 07/24/2019  . Other atopic dermatitis 05/01/2019  . Chronic respiratory failure with hypoxia ([place]) 04/23/2019  . Intellectual disability 04/23/2019  . History of alcohol use 11/23/2018  . Hypertension 05/17/2016  . History of tobacco abuse 04/06/2016  . COPD mixed type ([place]) 03/26/2016    Conditions to be addressed/monitored per PCP order:  Anxiety, Depression and Alcohol Abuse   [facility] LCSW successfully spoke  with patient on 05/13/20. [facility] LCSW received referral from [facility] RNCM to connect patient with depression and alcohol treatment resources. Patient reports that he is not experiencing any depressive symptoms at this time. Patient reports that he has also discontinued drinking and denies wanting [facility] LCSW to make a referral to Alcohol and Drug Services or to [facility] at this time. He reports that he does not wish to work on any mental health or substance abuse goals at this time but was appreciative of the resource education provided by [facility] LCSW today. Patient denies any further social work concerns. Patient reports that his main concerns are related to his health conditions. Patient denies needing a follow up call but is agreeable to contact [facility] LCSW if he changes his mind. [facility] LCSW will update [facility] RNCM and will close referral at this time.   There are no care plans that you recently modified to display for this patient.   Follow up:  Patient requests no follow-up at this time.  Plan: The patient has been provided with contact information for the Managed Medicaid care management team and has been advised to call with any health related questions or concerns.  Eula Fried, BSW, MSW, [employer] Man[employer] Medicaid [email or web address] [email or web address] Phone: [phone]

## 2020-05-14 ENCOUNTER — Other Ambulatory Visit: Payer: Self-pay

## 2020-05-14 ENCOUNTER — Other Ambulatory Visit: Payer: Medicaid Other | Admitting: Hospice

## 2020-05-14 DIAGNOSIS — Z87891 Personal history of nicotine dependence: Secondary | ICD-10-CM

## 2020-05-14 DIAGNOSIS — J441 Chronic obstructive pulmonary disease with (acute) exacerbation: Secondary | ICD-10-CM

## 2020-05-14 DIAGNOSIS — Z515 Encounter for palliative care: Secondary | ICD-10-CM

## 2020-05-14 DIAGNOSIS — Z87898 Personal history of other specified conditions: Secondary | ICD-10-CM | POA: Diagnosis not present

## 2020-05-14 NOTE — Progress Notes (Addendum)
[facility] Consult Note Telephone: [phone]  Fax: [phone]  PATIENT NAME: James [name] [address] [place] [address] [phone] (home)  DOB: [birth year] MRN: [id number]  PRIMARY CARE PROVIDER:    [name], MD,  [place] [place] [address] [phone]  REFERRING PROVIDER:   [name], MD [address],  [place] [address] [phone]  RESPONSIBLE PARTY:  Toretto Tingler Contact Information    Name Relation Home Work Mobile   [place] Sister   [phone]   [name] [phone]     Ausar, Georgiou Sister   [phone]      I met face to face with patient and family at home. Palliative Care was asked to follow this patient by consultation request of  [name], MD to address advance care planning, complex medical decision making and goals of care clarification. This is the initial visit.    ASSESSMENT AND / RECOMMENDATIONS:   Advance Care Planning: Our advance care planning conversation included a discussion about:     The value and importance of advance care planning   Difference between Hospice and Palliative care  Exploration of goals of care in the event of a sudden injury or illness   Identification and preparation of a healthcare agent   Review and updating or creation of an  advance directive document .  CODE STATUS: Discussion on code status. Patient affirmed he is a Full code. [name] affirmed this over the phone and said she needs everything done to treat him and keep him alive. She will be available next in person visit to further clarify goals of care using the MOST form.   Goals of Care: Goals include to maximize quality of life and symptom management. Patient wishes to be able to breath better and not be so winded on mild exertion. He committed to being cooperative to plan of care and to quit  lifestyle choices that impede his health.  NP spoke with [name] and updated her on visit. She expressed appreciation for the call and for palliative service.  I spent 46  minutes providing this initial consultation. More than 50% of the time in this consultation was spent on counseling patient and coordinating communication. --------------------------------------------------------------------------------------------------------------------------------------  Symptom Management/Plan: COPD: Education on slow deep breathing, pursed lip breathing, adhering to breathing treatments- Albuterol, Dulera and Spiriva, and Prednisone tabs. Cool environment, use fan for air circulation. Avoid irritants and triggers. Tobacco chewing cessation discussed. He reported he quit drinking alcohol  about a month ago. Validation and encouragement provided. Patient is followed by Pulmonologist- appointment is 05/26/2020. Hyptension: Metoprolol, Losartan Type DM: Lats A1c 6.7 03/26/2020. Continue Metformin as ordered.   A1c every 3 months. Routine CBC BMP.  Follow up: Palliative care will continue to follow for complex medical decision making, advance care planning, and clarification of goals. Return 6 weeks or prn.Encouraged to call provider sooner with any concerns.   Family /Caregiver/Community Supports: Patient lives at home by himself. Sister [name] visits often and is involved in his care.   PPS: 60%  HOSPICE ELIGIBILITY/DIAGNOSIS: TBD  Chief Complaint: Initial Palliative care visit/COPD  HISTORY OF PRESENT ILLNESS:  SRIKAR CHIANG is a 60 y.o. year old male  with multiple medical conditions including COPD chronic,  Worsening over the months, intermittent with flares/frequent hospitalizations, last hospitalization for exacerbation with Pneumonia 4/16-4/21/2022. COPD with associated symptoms of Shortness of breath, fatigue  worsened with alcohol consumption and when it is hot outside; breathing treatments are  helpful. History of Hypertension, Type 2 DM, tobaco use, alcohol use.   History obtained from review of EMR, discussion with primary team, caregiver, family and/or Mr. Pfeifle.  Review and summarization of Epic records shows history from other than patient. Rest of 10 point ROS asked and negative.     Review of lab tests/diagnostics   Results for JARYD, DREW (MRN [id number]) as of 05/14/2020 09:47  Ref. Range 03/26/2020 08:40  Hemoglobin A1C Latest Ref Range: 4.8 - 5.6 % 6.7 (H)   Component Ref Range & Units 2 wk ago  (04/24/20) 3 wk ago  (04/23/20) 3 wk ago  (04/21/20) 3 wk ago  (04/20/20) 3 wk ago  (04/19/20) 3 wk ago  (04/19/20) 3 wk ago  (04/19/20)  Sodium 135 - 145 mmol/L 139  141  140  140  140  141  143   Potassium 3.5 - 5.1 mmol/L 4.5  4.2  4.9  4.2  4.0  3.8  3.9   Comment: SLIGHT HEMOLYSIS  Chloride 98 - 111 mmol/L 103  106  105  105   101  103   CO2 22 - 32 mmol/L 30  29  28  30    29    Glucose, Bld 70 - 99 mg/dL 121High  113High CM  157High CM  201High CM   131High CM  134High CM   Comment: Glucose reference range applies only to samples taken after fasting for at least 8 hours.  BUN 6 - 20 mg/dL 26High  31High  23High  16   18  14    Creatinine, Ser 0.61 - 1.24 mg/dL 0.69  0.73  0.87  0.77   0.90  0.99   Calcium 8.9 - 10.3 mg/dL 9.0  8.9  9.3  8.9    9.5   GFR, Estimated >60 mL/min >60  >60 CM  >60 CM  >60 CM    >60 CM   Comment: (NOTE)  Calculated using the CKD-EPI Creatinine Equation (2021)   Anion gap 5 - 15 6  6  CM  7 CM  5 CM    11 CM   Comment: Performed at [facility], [place] [address], [place], [place] [address]  Resulting Agency  [facility]    ROS General: NAD EYES: denies vision changes ENMT: denies dysphagia Cardiovascular: denies chest pain/discomfort Pulmonary: denies cough, endorses shortness of breath at mild to moderate exertion Abdomen: endorses good  appetite, denies constipation, endorses continence of bowel GU: denies dysuria, urinary frequency MSK:  denies weakness,  no falls reported Skin: denies rashes or wounds Neurological: denies pain, denies insomnia Psych: Endorses positive mood Heme/lymph/immuno: denies bruises, abnormal bleeding  Physical Exam: Height/Weight 5 feet 9 inches/200 Ibs Constitutional: NAD General: Well groomed, cooperative EYES: anicteric sclera, lids intact, no discharge  ENMT: Moist mucous membrane CV: S1 S2, RRR, no LE edema Pulmonary: diminished in the bases,  no cough, no increased work of breathing, 95% RA during visit Abdomen: active BS + 4 quadrants, soft and non tender GU: no suprapubic tenderness MSK: weakness, sarcopenia, limited ROM Skin: warm and dry, no rashes or wounds on visible skin Neuro:  weakness, otherwise non focal Psych: non-anxious affect Hem/lymph/immuno: no widespread bruising   PAST MEDICAL HISTORY:  Active Ambulatory Problems    Diagnosis Date Noted  .  COPD mixed type ([place]) 03/26/2016  . History of tobacco abuse 04/06/2016  . Hypertension 05/17/2016  . History of alcohol use 11/23/2018  . Chronic respiratory failure with hypoxia ([place]) 04/23/2019  . Intellectual disability 04/23/2019  . Other atopic dermatitis 05/01/2019  . Type 2 diabetes mellitus without complication, without long-term current use of insulin ([place]) 07/24/2019  . Bronchiectasis ([place]) 09/24/2019  . Pain of right forearm 12/03/2019  . COPD with acute exacerbation ([place]) 03/26/2020  . Sepsis ([place]) 04/19/2020  . Lactic acid acidosis 04/19/2020  . HCAP (healthcare-associated pneumonia) 04/19/2020  . Obesity (BMI 30.0-34.9) 04/19/2020  . Alcohol withdrawal syndrome, with delirium [facility])    Resolved Ambulatory Problems    Diagnosis Date Noted  . Fall from ladder 06/05/2013  . Acute on chronic respiratory failure with hypoxia ([place]) 06/08/2013  . Altered mental status 06/08/2013  . Pneumonia, organism  unspecified([place]) 06/08/2013  . Hemothorax on left 06/11/2013  . Trauma of chest 06/22/2013  . Acute blood loss anemia 07/03/2013  . Clavicle fracture 07/03/2013  . Flail chest 07/03/2013  . Multiple trauma 07/03/2013  . Impingement of left ulnar nerve 09/11/2013  . Borderline hyperglycemia 03/26/2016  . Shortness of breath   . Leukocytosis 03/29/2016  . Pneumonia 04/06/2016  . Alcohol use disorder, moderate, dependence ([place]) 04/06/2016  . Elevated liver enzymes 04/06/2016  . HCAP (healthcare-associated pneumonia)   . Low back pain radiating to both legs 04/18/2016  . COPD exacerbation ([employer]) 02/16/2018  . CAP (community acquired pneumonia) 05/17/2018  . Tick bite 06/03/2018  . Multifocal pneumonia 08/09/2018  . Marijuana abuse 08/09/2018  . Pneumonia 09/11/2018  . Sepsis ([place]) 09/12/2018  . 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 Acute hypoxemic respiratory failure ([place])   . 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 Pneumonia due to COVID-19 virus 10/19/2019  . Acute encephalopathy   . COPD exacerbation ([place]) 03/26/2020  . Elevated troponin 03/26/2020   Past Medical History:  Diagnosis Date  . Community acquired pneumonia of right lower lobe of lung 05/17/2018  . COPD (chronic obstructive pulmonary disease) ([place])   . Diabetes ([place])   . Dyspnea   . Pneumothorax     SOCIAL HX:  Social History   Tobacco Use  . Smoking status: Former Smoker    Packs/day: 1.50    Years: 40.00    Pack years: 60.00    Types: Cigarettes    Quit date: 2014    Years since quitting: 8.3  . Smokeless tobacco: Current User    Types: Chew  Substance Use Topics  . Alcohol use: Yes    Alcohol/week: 28.0 standard drinks    Types: 28 Cans of beer per week    Comment: 4 beers a night; + jitters with not drinking, denies DTs or seizures     FAMILY HX:  Family History  Problem Relation Age of Onset  . Heart disease Father   . COPD Sister       ALLERGIES:  Allergies  Allergen Reactions  .  Gabapentin Other (See Comments)    hallucinations      PERTINENT MEDICATIONS:  Outpatient Encounter Medications as of 05/14/2020  Medication Sig  . Accu-Chek Softclix Lancets lancets Use as instructed to check blood sugar once daily.  [name] albuterol (PROVENTIL) (2.5 MG/3ML) 0.083% nebulizer solution Take 3 mLs (2.5 mg total) by nebulization every 6 (six) hours as needed for wheezing or shortness of breath.  [name] albuterol (VENTOLIN HFA) 108 (90 Base) MCG/ACT inhaler Inhale 2 puffs into the lungs every 4 (four)  hours as needed for wheezing or shortness of breath.  . Blood Glucose Monitoring Suppl (ACCU-CHEK GUIDE ME) w/Device KIT 1 each by Other route in the morning and at bedtime.  . budesonide-formoterol (SYMBICORT) 160-4.5 MCG/ACT inhaler INHALE 2 PUFFS INTO THE LUNGS 2 (TWO) TIMES DAILY.  . furosemide (LASIX) 20 MG tablet TAKE 1 TABLET (20 MG TOTAL) BY MOUTH DAILY AS NEEDED FOR EDEMA (LOWER EXTREMITY). (Patient taking differently: Take 20 mg by mouth daily as needed for edema.)  . glucose blood (TRUE METRIX BLOOD GLUCOSE TEST) test strip Use as instructed  . guaiFENesin (MUCINEX) 600 MG 12 hr tablet TAKE 1 TABLET (600 MG TOTAL) BY MOUTH 2 (TWO) TIMES DAILY. (Patient not taking: No sig reported)  . ketoconazole (NIZORAL) 2 % cream 1 (ONE) APPLICATION TO AFFECTED AREA TWICE A DAY (Patient not taking: No sig reported)  . losartan (COZAAR) 100 MG tablet TAKE 1 TABLET (100 MG TOTAL) BY MOUTH DAILY. (Patient taking differently: Take 100 mg by mouth daily.)  . metFORMIN (GLUCOPHAGE) 1000 MG tablet Take 1 tablet (1,000 mg total) by mouth 2 (two) times daily with a meal.  . metoprolol succinate (TOPROL-XL) 25 MG 24 hr tablet TAKE 1 TABLET (25 MG TOTAL) BY MOUTH DAILY. (Patient taking differently: Take 25 mg by mouth daily.)  . Multiple Vitamin (MULTIVITAMIN WITH MINERALS) TABS tablet Take 1 tablet by mouth daily. (Patient not taking: No sig reported)  . pantoprazole (PROTONIX) 40 MG tablet Take 1 tablet (40  mg total) by mouth daily.  . predniSONE (DELTASONE) 10 MG tablet Take 40 mg (4 tabs) daily for 3 days, followed by 30 mg (3 tabs) po daily for 3 days starting 4/25, followed by 20 mg (2 tabs) po daily for 3 days starting 4/28, followed by 10 mg po daily until seen by pulmonologist  . Respiratory Therapy Supplies (FLUTTER) DEVI Use 4 times daily (Patient taking differently: 1 each by Other route See admin instructions. Use 4 times daily)  . Tiotropium Bromide Monohydrate 2.5 MCG/ACT AERS INHALE 2 PUFFS INTO THE LUNGS DAILY.   No facility-administered encounter medications on file as of 05/14/2020.     Thank you for the opportunity to participate in the care of Mr. Stanco.  The palliative care team will continue to follow. Please call our office at [phone] if we can be of additional assistance.   Note: Portions of this note were generated with Lobbyist. Dictation errors may occur despite best attempts at proofreading.  Teodoro Spray, NP

## 2020-05-20 ENCOUNTER — Other Ambulatory Visit: Payer: Self-pay

## 2020-05-20 ENCOUNTER — Other Ambulatory Visit: Payer: Self-pay | Admitting: *Deleted

## 2020-05-20 ENCOUNTER — Telehealth: Payer: Self-pay | Admitting: Critical Care Medicine

## 2020-05-20 NOTE — Patient Instructions (Signed)
Visit Information  [name] was given information about Medicaid Managed Care team care coordination services as a part of their Healthy [facility] Medicaid benefit. James [name] verbally consented to engagement with the [facility] Managed Care team.   For questions related to your Healthy [facility] health plan, please call: [phone] or visit the homepage here: [email or web address]  If you would like to schedule transportation through your Healthy [facility] plan, please call the following number at least 2 days in advance of your appointment: [phone]   Call the [place] at [phone], at any time, 24 hours a day, 7 days a week. If you are in danger or need immediate medical attention call 911.  [name] - following are the goals we discussed in your visit today:  Goals Addressed            This Visit's Progress   . Manage Fatigue (Tiredness-COPD)       Follow Up Date 06/20/20   - develop a new routine to improve sleep - get at least 7 to 8 hours of sleep at night - keep room cool and dark - practice relaxation or meditation daily    Why is this important?    Feeling tired or worn out is a common symptom of COPD (chronic obstructive pulmonary disease).   Learning when you feel your best and when you need rest is important.   Managing the tiredness (fatigue) will help you be active and enjoy life.         . Stop or Cut Down Drinking Alcohol       Timeframe:  Long-Range Goal Priority:  High Start Date:   04/25/20                          Expected End Date:  08/25/20                     Follow Up Date 06/20/20   - avoid people and places where alcohol is used - develop a plan to avoid relapse  - think about counseling to assist in [place] - work with [name]/[name] for therapy options   Why is this important?   To stop drinking it is important to have support from a person or group of people who you can  count on.  You will also need to think about the things that make you feel like using alcohol; then plan for how to handle them.        . Track and Manage My Triggers-COPD       Timeframe:  Long-Range Goal Priority:  High Start Date:  04/09/20                           Expected End Date:   06/20/20                    Follow Up Date 06/20/20   - avoid outdoors during the warmest part of the day - avoid second hand smoke - identify and remove indoor air pollutants - limit outdoor activity during cold weather - listen for public air quality announcements every day  - take prescribed medications as directed - call PCP with new concerns or questions - take all medications as directed   Why is this important?    Triggers are activities or things, like tobacco smoke or cold weather, that make your  COPD (chronic obstructive pulmonary disease) flare-up.   Knowing these triggers helps you plan how to stay away from them.   When you cannot remove them, you can learn how to manage them.            Please see education materials related to COPD provided by MyChart link.    Eating Plan for Chronic Obstructive Pulmonary Disease Chronic obstructive pulmonary disease (COPD) causes symptoms such as shortness of breath, coughing, and chest discomfort. These symptoms can make it difficult to eat enough to maintain a healthy weight. Generally, people with COPD should eat a diet that is high in calories, protein, and other nutrients to maintain body weight and to keep the lungs as healthy as possible. Depending on the medicines you take and other health conditions you may have, your health care provider may give you additional recommendations on what to eat or avoid. Talk with your health care provider about your goals for body weight, and work with a dietitian to develop an eating plan that is right for you. What are tips for following this plan? Reading food labels  Avoid foods with more than 300  milligrams (mg) of salt (sodium) per serving.  Choose foods that contain at least 4 grams (g) of fiber per serving. Try to eat 20-30 g of fiber each day.  Choose foods that are high in calories and protein, such as nuts, beans, yogurt, and cheese.   Shopping  Do not buy foods labeled as diet, low-calorie, or low-fat.  If you are able to eat dairy products: ? Avoid low-fat or skim milk. ? Buy dairy products that have at least 2% fat.  Buy nutritional supplement drinks.  Buy grains and prepared foods labeled as enriched or fortified.  Consider buying low-sodium, pre-made foods to conserve energy for eating. Cooking  Add dry milk or protein powder to smoothies.  Cook with healthy fats, such as olive oil, canola oil, sunflower oil, and grapeseed oil.  Add oil, butter, cream cheese, or nut butters to foods to increase fat and calories.  To make foods easier to chew and swallow: ? Cook vegetables, pasta, and rice until soft. ? Cut or grind meat into very small pieces. ? Dip breads in liquid. Meal planning  Eat when you feel hungry.  Eat 5-6 small meals throughout the day.  Drink 6-8 glasses of water each day.  Do not drink liquids with meals. Drink liquids at the end of the meal to avoid feeling full too quickly.  Eat a variety of fruits and vegetables every day.  Ask for assistance from family or friends with planning and preparing meals as needed.  Avoid foods that cause you to feel bloated, such as carbonated drinks, fried foods, beans, broccoli, cabbage, and apples.  For older adults, ask your local agency on aging whether you are eligible for meal assistance programs, such as Meals on Wheels.   Lifestyle  Do not smoke.  Eat slowly. Take small bites and chew food well before swallowing.  Do not overeat. This may make it more difficult to breathe after eating.  Sit up while eating.  If needed, continue to use supplemental oxygen while eating.  Rest or relax for  30 minutes before and after eating.  Monitor your weight as told by your health care provider.  Exercise as told by your health care provider.   What foods should I eat? Fruits All fresh, dried, canned, or frozen fruits that do not cause gas. Vegetables All  fresh, canned (no salt added), or frozen vegetables that do not cause gas. Grains Whole-grain bread. Enriched whole-grain pasta. Fortified whole-grain cereals. Fortified rice. Quinoa. Meats and other proteins Lean meat. Poultry. Fish. Dried beans. Unsalted nuts. Tofu. Eggs. Nut butters. Dairy Whole or 2% milk. Cheese. Yogurt. Fats and oils Olive oil. Canola oil. Butter. Margarine. Beverages Water. Vegetable juice (no salt added). Decaffeinated coffee. Decaffeinated or herbal tea. Seasonings and condiments Fresh or dried herbs. Low-salt or salt-free seasonings. Low-sodium soy sauce. The items listed above may not be a complete list of foods and beverages you can eat. Contact a dietitian for more information. What foods should I avoid? Fruits Fruits that cause gas, such as apples or melon. Vegetables Vegetables that cause gas, such as broccoli, Brussels sprouts, cabbage, cauliflower, and onions. Canned vegetables with added salt. Meats and other proteins Fried meat. Salt-cured meat. Processed meat. Dairy Fat-free or low-fat milk, yogurt, or cheese. Processed cheese. Beverages Carbonated drinks. Caffeinated drinks, such as coffee, tea, and soft drinks. Juice. Alcohol. Vegetable juice with added salt. Seasonings and condiments Salt. Seasoning mixes with salt. Soy sauce. Angie Fava. Other foods Clear soup or broth. Fried foods. Prepared frozen meals. The items listed above may not be a complete list of foods and beverages you should avoid. Contact a dietitian for more information. Summary  COPD symptoms can make it difficult to eat enough to maintain a healthy weight.  A COPD eating plan can help you maintain your body weight  and keep your lungs as healthy as possible.  Eat a diet that is high in calories, protein, and other nutrients. Read labels to make sure that you are getting the right nutrients. Cook foods to make them easier to chew and swallow.  Eat 5-6 small meals throughout the day, and avoid foods that cause gas or make you feel bloated. This information is not intended to replace advice given to you by your health care provider. Make sure you discuss any questions you have with your health care provider. Document Revised: 10/30/2019 Document Reviewed: 10/30/2019 Elsevier Patient Education  2021 [employer].   Patient verbalizes understanding of instructions provided today.   Telephone follow up appointment with Managed Medicaid care management team member scheduled for:06/20/20 @ 3:30pm  [name] RN, BSN [place] RN Care Coordinator   Following is a copy of your plan of care:  Patient Care Plan: COPD (Adult)    Problem Identified: Symptom Exacerbation (COPD)     Long-Range Goal: Symptom Exacerbation Prevented or Minimized   Start Date: 04/09/2020  Expected End Date: 06/20/2020  This Visit's Progress: On track  Recent Progress: On track  Priority: High  Note:   Current Barriers:  [name] Knowledge deficits related to basic COPD self care/management-[name] was recently admitted for COPD exacerbation and pnuemonia. He reports taking his medications, wearing oxygen continuously, and has a follow up with his PCP on 04/14/20. He does report having difficulty at times swallowing food and liquid, pain in back and legs, and difficulty falling asleep. He is planning to discuss these issues with his PCP. [name] reports having transportation and plenty of food. He lives alone, and has support from his sister. [name] attended his follow up with PCP. He was working outside at his home today and reports doing "about the same". He reports increased difficulty breathing when  it is hot outside. He has his medications and oxygen, reports no needs at this time. He is working with [facility] pharmacist  for medication management.Update-[name] reports his breathing is about the same, worse with the warm temperatures. He is aware of his PCP appointment on 5/23 and has transportation. [name] would like to pick up his medications from [place] and would like prescriptions transferred. . Does not contact provider office for questions/concerns  Case Manager Clinical Goal(s):  patient will report using inhalers as prescribed including rinsing mouth after use  patient will engage in lite exercise as tolerated to build/regain stamina and strength and reduce shortness of breath through activity tolerance  patient will verbalize basic understanding of COPD disease process and self care activities  patient will not be hospitalized for COPD exacerbation as evidenced   Patient will work with [facility] pharmacist for medication management-[facility]  Patient will attend all scheduled appointments Interventions:  . Inter-disciplinary care team collaboration (see longitudinal plan of care) . Collaborated with [employer] and [facility] to have all prescriptions transferred, notified of patient needing refill for protonix   Encouraged to avoid being outside during the middle of the day when it is hot outside  Reviewed upcoming scheduled appointments  Provided RNCM contact information for any healthcare related barriers Patient Goals/Self-Care Activities:  . - avoid outdoors during the warmest part of the day . - avoid second hand smoke . - identify and remove indoor air pollutants . - limit outdoor activity during cold weather . - listen for public air quality announcements every day  . - take prescribed medications as directed . - call PCP with new concerns or questions . - develop a new routine to improve sleep . - get at least 7 to 8 hours of sleep at night . -  keep room cool and dark . - practice relaxation or meditation daily  . - call PCP with new concerns or questions . - take all medications as directed Follow Up Plan: Telephone follow up appointment with care management team member scheduled for:06/20/20 @ 3:30pm    Patient Care Plan: Medication Management    Problem Identified: Health Promotion or Disease Self-Management (General Plan of Care)     Goal: Medication Management   Note:   Current Barriers:  . Unable to independently monitor therapeutic efficacy . Unable to self administer medications as prescribed . Does not adhere to prescribed medication regimen . Does not maintain contact with provider office . Does not contact provider office for questions/concerns .   Pharmacist Clinical Goal(s):  [name] Over the next 30 days, patient will contact provider office for questions/concerns as evidenced notation of same in electronic health record through collaboration with PharmD and provider.  .   Interventions: . Inter-disciplinary care team collaboration (see longitudinal plan of care) . Comprehensive medication review performed; medication list updated in electronic medical record  _0 @ _1 @ _2 @ Health Maintenance  Patient Goals/Self-Care Activities . Over the next 30 days, patient will:  - collaborate with provider on medication access solutions  Follow Up Plan: The care management team will reach out to the patient again over the next 30 days.            Patient Care Plan: Wellness (Adult)    Problem Identified: (RNCM) Alcohol Use (Wellness)     Long-Range Goal: Alcohol Use Managed   Start Date: 04/25/2020  Expected End Date: 08/25/2020  This Visit's Progress: On track  Recent Progress: On track  Priority: High  Note:   Current Barriers:   Ineffective Self Health Maintenance-[name] was admitted on 4/16-4/21. Patient reports it was  because of the alcohol. He wants to quit  and feels that he can stop drinking on his own. Today he reports having all of his medications, plenty of food and denies any needs at this time. He was discharged with [facility], he is waiting to hear from the agency. Update-[name] reports no alcohol in a month. He no longer has the desire to drink.   Does not adhere to provider recommendations re: quit drinking alcohol  Currently UNABLE TO independently self manage needs related to chronic health conditions.   Knowledge Deficits related to short term plan for care coordination needs and long term plans for chronic disease management needs Nurse Case Manager Clinical Goal(s):   patient will work with care management team to address care coordination and chronic disease management needs related to alcohol use   Interventions:   Evaluation of current treatment plan related to alcohol use and quitting and patient's adherence to plan as established by provider.  Advised patient to avoid friends/family/situations that drinking is involved, consider counseling for quitting drinking, reach out to supportive friends/family  Discussed plans with patient for ongoing care management follow up and provided patient with direct contact information for care management team  Provided therapeutic listening Self Care Activities:  . Patient will self administer medications as prescribed . Patient will attend all scheduled provider appointments . Patient will call pharmacy for medication refills . Patient will call provider office for new concerns or questions Patient Goals: - avoid people and places where alcohol is used - develop a plan to avoid relapse  - think about counseling to assist in [place] - work with Ubaldo Glassing for therapy options Follow Up Plan: Telephone follow up appointment with care management team member scheduled for:06/20/20 @ 3:30pm

## 2020-05-20 NOTE — Telephone Encounter (Signed)
Copied from [place] [phone]. Topic: General - Other >> May 20, 2020 10:22 AM Tessa Lerner A wrote: Reason for CRM: Galina with [facility] has called to provide a report a patient's vitals  Patient's oxygen continues to go down with activity seeing as low as 86 and 85   Patient is only able to participate in continuous activity for about 1.5 minutes  Patient shares that they are continuing to use oxygen at night to assist with sleep  Patient is home bound but continues to leave after physical therapy appointments against the advice of home health providers  Please contact to advise further if needed

## 2020-05-20 NOTE — Patient Outreach (Signed)
Medicaid Managed Care   Nurse Care Manager Note  05/20/2020 Name:  James [name] MRN:  [id number] DOB:  May 31, 1960  James [name] is an 60 y.o. year old male who is a primary patient of [name], MD.  The [facility] Managed Care Coordination team was consulted for assistance with:    COPD DMII and alcohol cessation  [name] was given information about Medicaid Managed Care Coordination team services today. [name] agreed to services and verbal consent obtained.  Engaged with patient by telephone for follow up visit in response to provider referral for case management and/or care coordination services.   Assessments/Interventions:  Review of past medical history, allergies, medications, health status, including review of consultants reports, laboratory and other test data, was performed as part of comprehensive evaluation and provision of chronic care management services.  SDOH (Social Determinants of Health) assessments and interventions performed:   Care Plan  Allergies  Allergen Reactions  . Gabapentin Other (See Comments)    hallucinations    Medications Reviewed Today    Reviewed by [name], RN (Registered Nurse) on 05/20/20 at 1544  Med List Status: <None>  Medication Order Taking? Sig Documenting Provider Last Dose Status Informant  Accu-Chek Softclix Lancets lancets [id number] Yes Use as instructed to check blood sugar once daily. [name], MD Taking Active Self  albuterol (PROVENTIL) (2.5 MG/3ML) 0.083% nebulizer solution [id number] Yes Take 3 mLs (2.5 mg total) by nebulization every 6 (six) hours as needed for wheezing or shortness of breath. [name], MD Taking Active Self  albuterol (VENTOLIN HFA) 108 (90 Base) MCG/ACT inhaler [id number] Yes Inhale 2 puffs into the lungs every 4 (four) hours as needed for wheezing or shortness of breath. Ward, Delice Bison, DO Taking Active Self  Blood Glucose Monitoring Suppl (ACCU-CHEK GUIDE ME)  w/Device KIT [id number] Yes 1 each by Other route in the morning and at bedtime. [provider] Taking Active Self  budesonide-formoterol (SYMBICORT) 160-4.5 MCG/ACT inhaler [id number]  INHALE 2 PUFFS INTO THE LUNGS 2 (TWO) TIMES DAILY. [name], MD  Active Self  furosemide (LASIX) 20 MG tablet [id number] Yes TAKE 1 TABLET (20 MG TOTAL) BY MOUTH DAILY AS NEEDED FOR EDEMA (LOWER EXTREMITY).  Patient taking differently: Take 20 mg by mouth daily as needed for edema.   [name], MD Taking Active Self  glucose blood (TRUE METRIX BLOOD GLUCOSE TEST) test strip [id number] Yes Use as instructed [name], MD Taking Active Self  guaiFENesin (MUCINEX) 600 MG 12 hr tablet [id number] No TAKE 1 TABLET (600 MG TOTAL) BY MOUTH 2 (TWO) TIMES DAILY.  Patient not taking: No sig reported   [name], MD Not Taking Active Self  ketoconazole (NIZORAL) 2 % cream [id number] No 1 (ONE) APPLICATION TO AFFECTED AREA TWICE A DAY  Patient not taking: No sig reported   [name], MD Not Taking Active   losartan (COZAAR) 100 MG tablet [id number] Yes TAKE 1 TABLET (100 MG TOTAL) BY MOUTH DAILY.  Patient taking differently: Take 100 mg by mouth daily.   [name], MD Taking Active Self  metFORMIN (GLUCOPHAGE) 1000 MG tablet [id number] Yes Take 1 tablet (1,000 mg total) by mouth 2 (two) times daily with a meal. [name], MD Taking Active Self  metoprolol succinate (TOPROL-XL) 25 MG 24 hr tablet [id number] Yes TAKE 1 TABLET (25 MG TOTAL) BY MOUTH DAILY.  Patient taking differently: Take 25 mg by mouth daily.  [name], MD Taking Active Self  Multiple Vitamin (MULTIVITAMIN WITH MINERALS) TABS tablet [id number] No Take 1 tablet by mouth daily.  Patient not taking: No sig reported   [name], MD Not Taking Active   pantoprazole (PROTONIX) 40 MG tablet [id number] Yes Take 1 tablet (40 mg total) by mouth daily. [name], MD Taking Active Self  predniSONE  (DELTASONE) 10 MG tablet [id number] Yes Take 40 mg (4 tabs) daily for 3 days, followed by 30 mg (3 tabs) po daily for 3 days starting 4/25, followed by 20 mg (2 tabs) po daily for 3 days starting 4/28, followed by 10 mg po daily until seen by pulmonologist [name], MD Taking Active   Respiratory Therapy Supplies (FLUTTER) [place] [id number]  Use 4 times daily  Patient taking differently: 1 each by Other route See admin instructions. Use 4 times daily   [name], MD  Active Self  Tiotropium Bromide Monohydrate 2.5 MCG/ACT AERS [id number] Yes INHALE 2 PUFFS INTO THE LUNGS DAILY. [name], MD Taking Active Self          Patient Active Problem List   Diagnosis Date Noted  . Alcohol withdrawal syndrome, with delirium ([place])   . Sepsis ([place]) 04/19/2020  . Lactic acid acidosis 04/19/2020  . HCAP (healthcare-associated pneumonia) 04/19/2020  . Obesity (BMI 30.0-34.9) 04/19/2020  . COPD with acute exacerbation ([place]) 03/26/2020  . Pain of right forearm 12/03/2019  . Bronchiectasis ([place]) 09/24/2019  . Type 2 diabetes mellitus without complication, without long-term current use of insulin ([place]) 07/24/2019  . Other atopic dermatitis 05/01/2019  . Chronic respiratory failure with hypoxia ([place]) 04/23/2019  . Intellectual disability 04/23/2019  . History of alcohol use 11/23/2018  . Hypertension 05/17/2016  . History of tobacco abuse 04/06/2016  . COPD mixed type ([place]) 03/26/2016    Conditions to be addressed/monitored per PCP order:  COPD, DMII and alcohol cessation  Care Plan : COPD (Adult)  Updates made by [name], RN since 05/20/2020 12:00 AM    Problem: Symptom Exacerbation (COPD)     Long-Range Goal: Symptom Exacerbation Prevented or Minimized   Start Date: 04/09/2020  Expected End Date: 06/20/2020  This Visit's Progress: On track  Recent Progress: On track  Priority: High  Note:   Current Barriers:  [name] Knowledge deficits related to basic COPD self  care/management-[name] was recently admitted for COPD exacerbation and pnuemonia. He reports taking his medications, wearing oxygen continuously, and has a follow up with his PCP on 04/14/20. He does report having difficulty at times swallowing food and liquid, pain in back and legs, and difficulty falling asleep. He is planning to discuss these issues with his PCP. Mr. Clauson reports having transportation and plenty of food. He lives alone, and has support from his sister. Mr. Dall attended his follow up with PCP. He was working outside at his home today and reports doing "about the same". He reports increased difficulty breathing when it is hot outside. He has his medications and oxygen, reports no needs at this time. He is working with [facility] pharmacist for medication management.Update-Mr. Wanat reports his breathing is about the same, worse with the warm temperatures. He is aware of his PCP appointment on 5/23 and has transportation. Mr. Latterell would like to pick up his medications from [place] and would like prescriptions transferred. . Does not contact provider office for questions/concerns  Case Manager Clinical Goal(s):  patient will report using inhalers as prescribed  including rinsing mouth after use  patient will engage in lite exercise as tolerated to build/regain stamina and strength and reduce shortness of breath through activity tolerance  patient will verbalize basic understanding of COPD disease process and self care activities  patient will not be hospitalized for COPD exacerbation as evidenced   Patient will work with [facility] pharmacist for medication management-[facility]  Patient will attend all scheduled appointments Interventions:  . Inter-disciplinary care team collaboration (see longitudinal plan of care) . Collaborated with [facility] and [facility] to have all prescriptions transferred, notified of patient needing refill for protonix   Encouraged to avoid  being outside during the middle of the day when it is hot outside  Reviewed upcoming scheduled appointments  Provided RNCM contact information for any healthcare related barriers Patient Goals/Self-Care Activities:  . - avoid outdoors during the warmest part of the day . - avoid second hand smoke . - identify and remove indoor air pollutants . - limit outdoor activity during cold weather . - listen for public air quality announcements every day  . - take prescribed medications as directed . - call PCP with new concerns or questions . - develop a new routine to improve sleep . - get at least 7 to 8 hours of sleep at night . - keep room cool and dark . - practice relaxation or meditation daily  . - call PCP with new concerns or questions . - take all medications as directed Follow Up Plan: Telephone follow up appointment with care management team member scheduled for:06/20/20 @ 3:30pm    Care Plan : Wellness (Adult)  Updates made by [name], RN since 05/20/2020 12:00 AM    Problem: (RNCM) Alcohol Use ([facility])     Long-Range Goal: Alcohol Use Managed   Start Date: 04/25/2020  Expected End Date: 08/25/2020  This Visit's Progress: On track  Recent Progress: On track  Priority: High  Note:   Current Barriers:   Ineffective Self Health Maintenance-Mr. Barich was admitted on 4/16-4/21. Patient reports it was because of the alcohol. He wants to quit and feels that he can stop drinking on his own. Today he reports having all of his medications, plenty of food and denies any needs at this time. He was discharged with [facility], he is waiting to hear from the agency. Update-Mr. Moctezuma reports no alcohol in a month. He no longer has the desire to drink.   Does not adhere to provider recommendations re: quit drinking alcohol  Currently UNABLE TO independently self manage needs related to chronic health conditions.   Knowledge Deficits related to short term plan for care coordination needs  and long term plans for chronic disease management needs Nurse Case Manager Clinical Goal(s):   patient will work with care management team to address care coordination and chronic disease management needs related to alcohol use   Interventions:   Evaluation of current treatment plan related to alcohol use and quitting and patient's adherence to plan as established by provider.  Advised patient to avoid friends/family/situations that drinking is involved, consider counseling for quitting drinking, reach out to supportive friends/family  Discussed plans with patient for ongoing care management follow up and provided patient with direct contact information for care management team  Provided therapeutic listening Self Care Activities:  . Patient will self administer medications as prescribed . Patient will attend all scheduled provider appointments . Patient will call pharmacy for medication refills . Patient will call provider office for new concerns or questions  Patient Goals: - avoid people and places where alcohol is used - develop a plan to avoid relapse  - think about counseling to assist in [place] - work with [facility] for therapy options Follow Up Plan: Telephone follow up appointment with care management team member scheduled for:06/20/20 @ 3:30pm      Follow Up:  Patient agrees to Care Plan and Follow-up.  Plan: The Managed Medicaid care management team will reach out to the patient again over the next 30 days.  Date/time of next scheduled RN care management/care coordination outreach: 06/20/20 @ 3:30pm  [name] RN, BSN [employer] RN Care Coordinator

## 2020-05-21 NOTE — Telephone Encounter (Signed)
Not much I can add or offer here

## 2020-05-23 NOTE — Telephone Encounter (Signed)
James [name] is aware that PCP is notified and aware of patient report.

## 2020-05-25 NOTE — Progress Notes (Signed)
Subjective:    Patient ID: ELLSWORTH WALDSCHMIDT, male    DOB: 1960-04-14, 60 y.o.   MRN: [id number] Virtual Visit via Telephone Note  I connected with [name] on 05/26/20 at  2:30 PM EDT by telephone and verified that I am speaking with the correct person using two identifiers.   Consent:  I discussed the limitations, risks, security and privacy concerns of performing an evaluation and management service by telephone and the availability of in person appointments. I also discussed with the patient that there may be a patient responsible charge related to this service. The patient expressed understanding and agreed to proceed.  Location of patient: Patient is out driving in his truck  Location of provider: I am in my office  Persons participating in the televisit with the patient.   No one else on the call  History of Present Illness: 08/23/18 60 y.o.M with advanced Copd   I saw this patient a week ago and prescribed antibiotics and prednisone for what I perceived to be persisting pneumonia.  The patient had a slight improvement but then worsened and came to the emergency room the end of the week and was admitted for 3 days for COPD exacerbation and lower lobe pneumonia.  Chest x-ray did show persistent infiltrates in the lower lobes.  The patient is now discharged and returns to the office for post hospital follow-up.  Below is the discharge summary Dc summary : Admit date: 07/31/2018 Discharge date: 08/01/2018  Time spent: 45 minutes  Recommendations for Outpatient Follow-up:  Patient will be discharged to home.  Patient will need to follow up with primary care provider within one week of discharge.  Patient should continue medications as prescribed.  Patient should follow a heart healthy diet.   Discharge Diagnoses:  Acute on chronic respiratory failure with hypoxia secondary to COPD Exacerbation and pneumonia Essential hypertension  Discharge Condition: Stable  Diet  recommendation: heart healthy  Filed Weights  07/31/18 0231 07/31/18 0602 Weight: 85.3 kg 83.4 kg   History of present illness:  on 07/31/2018 by Dr. [name] a 60 y.o.malewith medical history significant ofCOPD. Patient has had 3 prior admits for COPD / PNA thus far this year, most recently in May.  Patient has been being treated for COPD exacerbation and BLL CAP as outpatient by [name] with levaquin and prednisone. Just finished levaquin yesterday. Prednisone remained at 23m daily with plans to initiate a slow taper.  Despite this, patient developed severe SOB and respiratory distress this evening. EMS called and patient noted to be satting 80% on RA. He is not on chronic O2 for his COPD.  Hospital Course:  Acute on chronic respiratory failure with hypoxia secondary to COPD Exacerbation and pneumonia -Patient noted to have oxygen saturations in the 80s on room air -Chest x-ray noted for pneumonia -Was recently placed on Levaquin and steroids by PCP approximately 1 week ago -On admission, patient continued to have wheezing well into his hospital stay day 1.  Patient currently still has some wheezing however has improved. -Was placed on Solu-Medrol, along with nebulizer treatments, azithromycin and ceftriaxone -Patient did require oxygen upon admission however has been able to ambulate and maintain oxygen saturations in the high 90s on room air. -Will discharge patient with prednisone taper, azithromycin and Ceftin. -Given that patient had oxygen saturations in the 80s on admission, it was thought that he would require several days in the hospital. Patient actually improved quicker than expected.  Essential hypertension -Continue losartan  Since discharge the patient is tried to go back to work but has extreme difficulty in the heat with this.  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  10/05/2018 This patient's not been seen since the end of July he had failed several follow-up visits.  He did see Dr. [name] end of August who did not make major changes in his medication program.  He is to remain on [name] and Incruse.  The patient after being admitted in July got readmitted early August and then again in early September.  Discharge summaries are as noted below.  During the August admission the patient underwent bronchoscopy and had mucous plugging removed the culture showed normal flora Note this patient has not been actively smoking recently Admit date: 08/09/2018 Discharge date: 08/11/2018  Discharge Diagnoses:  Acute on chronic hypoxic respiratory failure/sepsis due to multifocal pneumonia COPD Alcohol/marijuana use Essential hypertension  History of present illness:  on 08/09/2018 by Dr. [name] a 60 y.o.malewith medical history significant ofCOPD and ETOH dependence presenting with respiratory distress.He woke up about 530 this AM and couldn't breathe. This just keeps happening. He felt similarly when hospitalized last week. He has been doing fine until today. No h/o OSA, not on CPAP. Not on home O2. +cough, productive of yellowish sputum. It is sometimes hard to get the sputum up. No fever. +wheezing since his last hospitalization. Quit smoking in 2014. He lives in a non-air-conditioned log cabin.  He was hospitalized from 7/27-28 for acute on chronic respiratory failure associated with COPD exacerbation and PNA. This was his 4th admission this year. He was seen by [name] on 7/30 with plan for completion of course of Keflex and prednisone, as well as prednisone  taper.  Hospital Course:  Acute on chronic hypoxic respiratory failure/sepsis due to multifocal pneumonia -Was recently hospitalized in late July for pneumonia and COPD exacerbation. Was discharged with antibiotics as well as prednisone taper. Patient followed up with [name] on 08/03/2018 with completion of Keflex and prednisone as well. -This is his fourth admission this year for similar presentation -Patient presented with cough and shortness of breath -Chest x-rayon admissionfindings consistent with right-sided multifocal pneumonia -CTA chest showed multifocal pneumonia on the right. COPD including emphysema. Negative for PE. -COVID negative -Upon admission, patient had leukocytosis, fever, tachycardia, tachypnea- all resolving  -Noted to be hypoxic 88% on room air in the ED -Urine strep pneumoniaand legionellaantigensnegative -Sputum culture: Few GPC, GVR, rare yeast -Blood cultures show no growth -was placed on vancomycin, Zosyn, Solu-Medrol, supplemental oxygen -S/p bronchoscopy 8/6, pending culture results -CXR obtained after bronch on 8/6: almost complete clearing of the hazy infiltrates in the right lung -Discussed with Dr. Tamala Julian (pulm), recommended prednisone taper starting with prednisone 17m daily x 1 week, taper as an outpatient, along with Augmentin. He will follow up with the patient in one week. Given that infection cleared on xray so quickly, question if this is inflammatory vs infection.  -patient able to ambulate today without oxygen and maintained oxygen saturations in the mid 90s- therefore patient does not qualify for home oxygen -patient  also had a swallow eval- mild aspiration risk  COPD -Continue nebulizer treatments, steroids, inhalers -Incentive spirometry, flutter valve  Alcohol/marijuana use -Discussed cessation -was placed on continue CIWA protocol  Essential hypertension -Continue Cozaar  Consultants PCCM- pulmonology    Procedures Bronchoscopy   Recommendations for Outpatient Follow-up:  1. Follow up with PCP in 1-2 weeks 2. Please obtain BMP/CBC in one week your next doctors visit.  3. Augmentin and azithromycin for 5 days 4. Advised to continue using bronchodilators every 4-6 hours for the next 4-5 days and then transition to as needed. 5. Advised to take his home prednisone, for next 3 days advised to increase it to 40 mg twice daily then resume back is 20 mg daily  Discharge Condition: Stable CODE STATUS: Full code Diet recommendation: 2 g salt   DC summary from 09/11/18  Adm 9/7  D/C 09/13/18 Brief/Interim Summary: Mortimer Fries [name] a 60 y.o.malewith history ofCOPD on prednisone, HTN, alcohol use disorder, marijuana use,tobacco user. Patient presented secondary to dyspnea and found to have evidence of pneumonia and a COPD exacerbation in addition to sepsis physiology on admission.  There is concerns of possible right lower lobe pneumonia therefore started on Unasyn and azithromycin due to concerns of aspiration.  This was transitioned to Augmentin and azithromycin for total of 5 days at time of discharge.  Secondary to infection, he was having COPD exacerbation therefore on bronchodilators.  He started improving and on the day of discharge he was adamant about going home as he was feeling much better.  He was still having abnormal breath sounds and cough therefore I had requested him to stay another day in the hospital but still wanted to go home.  Instructions were given to him take 40 mg of prednisone for next 3 days and then he can resume back his 20 mg daily.  Advised... home bronchodilators periodically every 4-6 hours for next 4-5 days and then transition to as needed. Advised to follow-up with his PCP in about 1 week.   Discharge Diagnoses:  Active Problems:   Hypertension   COPD exacerbation ([place])   Marijuana abuse   Pneumonia   Sepsis ([place])  Acute respiratory distress  secondary to right lower lobe pneumonia, community-acquired/aspiration Acute mild exacerbation of COPD Concern for possible aspiration.  Unasyn and azithromycin transition to Augmentin and azithromycin.  We will give him 5 more day course at the time of discharge 40 mg of prednisone for next 3 days then transition to 20 mg daily which is his home dose Advised to use bronchodilators.3  Alcohol use disorder Counseled to quit drinking alcohol.  Advised to continue thiamine, folate and multivitamin  Weakness -He did well with physical therapy.  Tobacco use Marijuana use Cessation discussed on admission  Consultations:  None  Subjective: Still having some cough and congestion but he is adamant about going home today as he is feeling much better than from admission.  I recommended he stay in the hospital for at least 1 more day to help him feel even better.  He wished to go home. Ambulating in the hallway without additional saturating greater than 90% on room air.   Since discharge this patient went to urgent care on 21 September with an outbreak of shingles over his left lower back.  He is taken a course of valacyclovir and prednisone.  He has severe pain.  He was given gabapentin but this caused hallucinations and then we tried tramadol but this caused severe nausea and vomiting.  Apparently the patient cannot take Tylenol or nonsteroidal anti-inflammatories as well.  The patient states his breathing is been labored.  He still has productive cough of white mucus.  He has no fever.  He has significant back pain because of the shingles. The patient is maintaining the [facility] and Incruse as well as his antihypertensive medications and vitamins     10/12/2018  This patient is seen in return follow-up for acute shingles with postherpetic neuropathy, recent pneumonia, COPD exacerbation, and hypertension.  Patient is markedly improved from the last visit.  He has less cough less shortness  of breath.  He has improved pain around the area of the shingles.  He is taking amitriptyline 25 mg at bedtime  The patient notes less pain.  He does state he had been using the tramadol without nausea or vomiting.  I asked if patient would be interested in the para medicine program and expressed interest in the community para medicine program  11/23/2018 Patient seen in return follow-up and has noted increasing shortness of breath and we had to resume prednisone this patient.  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  He does maintain his inhalers.  He is drinking 4 beers a night.  He is not smoking.  Note the patient continues to live in a home that has standing water under the foundation and in several the rooms and mold on the carpet.  We have connected this patient with legal aid to see if they can assist him in getting into better housing.  The patient's not had previous allergy testing   12/13/2018 The patient notes increased shortness of breath, wheezing and cough productive of thick yellow mucus.  He is using the flutter valve but is not being is productive with this.  He denies chest pain or fever.  He is down to 2 a day on the prednisone.  He is maintaining his inhalers. Apparently there is repair is being done on the patient's home and he is decided to stay in the home  02/27/2019 This patient is seen in return follow-up for COPD.  The patient has significant mold exposure in the home.  There has been some mitigation of this but is not complete.  The patient just was in the urgent care for COPD exacerbation a week ago treated with a course of antibiotics and prednisone.  He is now off prednisone. Patient states the pain in the back has resolved however he is still having increased shortness of breath.  He still having productive cough of thick yellow mucus.  05/01/2019  Since the last OV the patient has been admitted for Copd flare again. Below is the discharge summary Dc  summary:  Admit Date: 04/23/2019 Discharge date: 04/27/2019  Recommendations for Outpatient Follow-up:  1. Follow up with PCP in 1-2 weeks 2. Please obtain CMP/CBC in one week 3. Please repeat chest x-ray in the next 6 to 8 weeks to document resolution of infiltrates, if not may require further work-up.     Brief Narrative: Patient is a39 y.o.malewith history of COPD, intellectual disability, HTN who presented with shortness of breath-found to have acute hypoxic respiratory failure secondary to pneumonia and COPD exacerbation. Patient initially required BiPAP on admission. See below for further details.  Significant events: 4/19>> admit to [facility] for severe hypoxemia requiring BiPAP 4/20>> liberated from BiPAP  Antimicrobial therapy: Rocephin 4/18>>4/23 Zithromax 4/18>>4/22  Microbiology data: Blood culture: 4/19>> negative  Procedures : None  Consults: None  Brief Hospital Course: Acute hypoxic/hypercarbic respiratory  failure secondary to COPD exacerbation and pneumonia:Improved-initially required BiPAP on admission-titrated off BiPAP on 4/20.Now on room air with stable O2 saturations.  Feels much better-treated with Rocephin and Zithromax-we will transition to [facility] for a few more days on discharge.  He will resume his usual inhaler regimen-he will be placed on 20 mg of p.o. prednisone until he is seen by [name].  PCP to repeat a chest x-ray in 6 to 8 weeks to document resolution of infiltrates-if not he may require further work-up to rule out underlying malignancy.  (Per patient's sister-patient probably has been taking more prednisone than he should be-he is unable to read-and is not able to follow directions)  HTN: BPstable-continue losartan  EtOH abuse:No signs of withdrawal-managed with  Ativan per protocol. Per patient's sister-he drinks around 2-3 bottles of beer on a daily basis.  Intellectual disability:Spoke at length with patient's  sister ([name]) today-patient isa illiterate-and has been living on his own for all his life. His other sister lives a [place] and checks on him daily. Sister acknowledges poor living conditions-but apparently this is the way that patient has lived all his life-and chooses to live this way. 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 Patient is currently managing all his finances. [name] does acknowledge that patient will not let any home health services come to his house-he will not go to SNF. Difficult situation-but even though patient is an illiterate/intellectual disability-he understand that he is somewhat deconditioned from his acute illness-and is in need of some help-he does not want therapy services-he understands the risk of falls from being weak.  This MD had numerous discussions with him regarding his appropriate disposition-all he wants to do is go home-his sister-[name] wants to have him come to her place in [place], [place] for a few weeks-however the patient really does not want to go there-and wants to go home.  I have discussed at length with social worker as well-at this point he is being discharged home at his own request.  His family will keep an eye on him.   Nutrition Problem: Nutrition Problem: Increased nutrient needs Etiology: chronic illness(COPD) Signs/Symptoms: estimated needs Interventions: Ensure Enlive (each supplement provides 350kcal and 20 grams of protein)  Discharge Diagnoses:  Principal Problem:   Acute on chronic respiratory failure with hypoxia and hypercapnia (HCC) Active Problems:   Hypertension   COPD exacerbation ([place])   Community acquired pneumonia of right lower lobe of lung   Marijuana abuse   Intellectual disability  Since discharge the patient has completed his antibiotics for his right lower lobe pneumonia.  He is on tapering prednisone.  He has developed a rash on his arms and face.  The rash is pruritic in nature.  He has been  maintaining his medications as prescribed.   07/23/2019 Since the last visit this patient was readmitted to the hospital between 30 June and 3 July.  Below is a copy of the discharge summary Discharge Diagnosis: 1. Acute hypoxic respiratory 2/2 COPD exacerbation vs pneumonitis 2. GERD 3. EtOH history 4. HTN  Discharge Medications: Allergies as of 07/07/2019     Reactions  Gabapentin Other (See Comments)  hallucinations  Disposition and follow-up:   Mr.Twain D Eddings was discharged from [facility] in Stable condition.  At the hospital follow up visit please address:  1.  COPD exacerbation: Continue daily Albuterol, Dulera, Spiriva, and prednisone 40 mg daily. Sent home with 1 additional day of azithromycin. Discharged on 2L [place]. F/u allergy  labs.  GERD: Severe reflux noted on esophagram, started on pantoprazole daily, may need GI follow up.   2.  Labs / imaging needed at time of follow-up: None  3.  Pending labs/ test needing follow-up: IgE, allergen panel for mold, hypersensitivity pneumonitis  Note since discharge the patient's breathing is back to baseline.  The patient does have some wheezing and minimal productive cough.  Noticed blood pressures ranged up at this visit.  He is maintaining Dulera Spiriva as before he has been maintained on prednisone 40 mg daily and also discharged on 2 L oxygen.  He no longer is actively working at this time.  Note his mold assay did show a mild hypersensitivity to some yeast.  08/23/2019 Patient returns today in follow-up showing increased shortness of breath.  He continues to work outdoors in the heat and this is exacerbating his condition despite being on 20 mg of prednisone daily.  He notes thick yellow mucus that he is coughing up.  He notes increased wheeze.  He has ran out of his Spiriva        10/02/2019 This patient returns after having been in the hospital again for 4 days with discharge summary as noted  below. Patient returns today with continued shortness of breath and productive cough. Mucus is still thick and yellow and difficult to raise. He has chest tightness. He still drinking 6-7 beers daily. He is no longer smoking. He is now on oxygen 4 L continuous. He has had his Medicaid approved and is now fully declared disabled. 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 The landlord has a complex relationship with this patient. Patient lives rent free and a home on the landlord's property. The home is not in good repair as documented previously.   Date of Admission: 09/22/2019  3:42 AM Date of Discharge: 09/25/2019 Attending Physician: Lenice Pressman, MD PhD  Discharge Diagnosis: 1. COPD exacerbation, underlying bronchiectasis 2. Alcohol use disorder 3. Skin rash    Disposition and follow-up:   Mr.Andrell D Hillyard was discharged from [facility] in Stable condition.  At the hospital follow up visit please address:  1.  Follow up      A. COPD exacerbation, recurrent pneumonias, underlying bronchiectasis - hx of recurrent pneumonia, see hospital course for details on prior workup, follow up with his pulmonologist      B. Alcohol use disorder - had mild withdrawal symptoms, motivated to quit, consider pharmacologic assistance such as naltrexone      C. Skin rash - not improving with triamcinolone, referred to dermatology  2.  Labs / imaging needed at time of follow-up: skin biopsy  3.  Pending labs/ test needing follow-up: none  Follow-up Appointments:  Hospital Course by problem list:  COPDexacerbation Recurrent pneumonia Bronchiectasis on bronchoscopy Presents with increased SOB, but cough baseline and not productive. He has had similar symptoms many times previously. PFTs in April 2021 with FEV1/FVC which was 54.01% of expected. CXR this admission with vague R lower lung field opacity, same location as opacity on prior  CXRs. Has had recurrent pneumonia as far back as 2015. bronchoscopy 08/10/2018 for recurrent PNU demonstrating severe chronic bronchitis and bronchiectasis,neutrophil predominance and transbronchial biopsies with benign lung tissue with inflammation. CT on 07/05/19 with no ILD, no centrally obstructing lesion. Esophagram on 07/06/19 with no stenosis but severe reflux which could be contributing to COPD. Other pulm workup significant for IgE WNL, allergen profile  for mold equivocal for Candida albicans but negative for all others, negative hypersensitivity pneumonitis panel. Has very good outpatient follow up with pulmonology. Is on Dulera, Spiriva, and albuterol and also prednisone 58m daily, states he has been compliant with all these, though at one point earlier this admission denied steroid use. Treated with ceftriaxone, azithromycin, and prednisone initially with rapid improvement in SOB. No fevers this admission, cough remains unproductive so did not culture. Discharged on his home Dulera, Spiriva, albuterol, Prednisone. Discharged with oral azithromycin to complete 5 day course.   Alcohol Use Disorder Reports using 2x 24oz beers per day, which is decreased from prior. No hx of withdrawal seizures. CIWA scores max of 15 indicating mild withdrawal, requiring relatively low amounts of Ativan. On day of discharge had scores of up to 5. Patient is motivated to quit drinking because "I want to live longer." Discharged with PO thiamine.  Skin rash Reports a rash for the past several months on his abdomen and both arms. His PCP has been treating this with triamcinolone, but this has not really helped. Referral provided to dermatology for biopsy/further assessment.  Note the patient's rash on both forearms is still persistent and triamcinolone has not helped  This patient will need a dermatology referral as per discharge summary. Note the patient is still actively drinking alcohol. See shortness of breath  assessment below  11/06/2019 This patient is seen by way of a telephone visit and post hospital.  He was just admitted again for recurrent pneumonia this time with Covid infection.  This patient was intubated was severely ill.  He is now on oxygen 4 L continuous and 40 mg daily of prednisone.  He also has a allergic dermatitis rash.  This has not been responsive to steroids and is quite pruritic despite hydroxyzine when seen recently at urgent care.  I went over with this patient at this visit the need to receive assisted care but he refuses and wishes to stay in his current living arrangement which has very poor environment  Below is a copy of the recent discharge summary  Admission date:  10/19/2019  Admitting Physician  No admitting provider for patient encounter.  Discharge Date:  10/26/2019   Primary MD  WElsie Stain MD  Recommendations for primary care physician for things to follow:  -Patient instructed to follow with PCP/pulmonary as an outpatient, appointment will be rescheduled till he is off quarantine as discussed with the patient and staff. -Please check CBC, CMP during next visit.  Admission Diagnosis  COPD exacerbation (HCC) (J44.1) Acute on chronic respiratory failure with hypoxia (HCC) (J96.21) Pneumonia due to COVID-19 virus (U07.1, J12.82)   Discharge Diagnosis  COPD exacerbation (HCC) (J44.1) Acute on chronic respiratory failure with hypoxia (HCC) (J96.21) Pneumonia due to COVID-19 virus (U07.1, J12.82)    Active Problems:   Acute on chronic respiratory failure with hypoxia (HCC)   Pneumonia due to COVID-19 virus      Past Medical History: Diagnosis Date . Acute on chronic respiratory failure with hypoxia ([facility] 06/08/2013 . CAP (community acquired pneumonia) 05/17/2018  See admit 05/17/18 ? rml  with   covid pcr neg - rx augmentin > f/u cxr in 4-6 weeks is fine unless condition declines  . Community acquired pneumonia of right lower lobe of  lung 05/17/2018  See admit 05/17/18 ? rml  with   covid pcr neg - rx augmentin > f/u cxr in 4-6 weeks is fine unless condition declines  . COPD (chronic obstructive pulmonary  disease) ([place])   not on home . Diabetes ([place])  . Dyspnea  . Hypertension  . Pneumonia 04/06/2016 . Pneumothorax   2016, fell from ladder . 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 Tick bite 06/03/2018   Past Surgical History: Procedure Laterality Date . VIDEO ASSISTED THORACOSCOPY (VATS)/THOROCOTOMY Left 06/11/2013  Procedure: VIDEO ASSISTED THORACOSCOPY (VATS)/THOROCOTOMY;  Surgeon: Ivin Poot, MD;  Location: [facility];  Service: Thoracic;  Laterality: Left; [name] VIDEO BRONCHOSCOPY N/A 06/11/2013  Procedure: VIDEO BRONCHOSCOPY;  Surgeon: Ivin Poot, MD;  Location: [facility];  Service: Thoracic;  Laterality: N/A; . VIDEO BRONCHOSCOPY N/A 08/10/2018  Procedure: VIDEO BRONCHOSCOPY WITH FLUORO;  Surgeon: Candee Furbish, MD;  Location: [facility] ENDOSCOPY;  Service: Endoscopy;  Laterality: N/A;      History of present illness and  Hospital Course:     Kindly see H&P for history of present illness and admission details, please review complete Labs, Consult reports and Test reports for all details in brief  HPI  from the history and physical done on the day of admission by PCCM on 10/19/2019  Pt is encephelopathic; therefore, this HPI is obtained from chart review. Bryden Darden [name] a 60 y.o.malewho has a PMH as outlined below.[facility] ED [facility]15 with significant dyspnea to the point that he could not speak in full sentences. He was placed on CPAP but did not respond; therefore, he was intubated.  After intubation, COVID returned positive.  He has been evaluated by pulmonary on prior admissions given recurrent PNA. He has bronchoscopy in Aug 2020 which showed severe bronchiectasis bilaterally, neutrophil predominance and transbronchial biopsies were benign.   Hospital Course   Linell Shawn  [name] a 60 y.o.malewho has a PMH of chronic respiratory failure, COPD, diabetes, hypertension, presented to [facility] ED 10/15 with significant dyspnea to the point that he could not speak in full sentences. He was placed on CPAP but did not respond; therefore, he was intubated.After intubation, COVID returned positive. He has been evaluated by pulmonary on prior admissions given recurrent PNA. He has bronchoscopy in Aug 2020 which showed severe bronchiectasis bilaterally, neutrophil predominance and transbronchial biopsies were benign. Patient was successfully extubated 10/17, and he was transferred to [facility] on 10/19.   Acute on chronic hypoxic Resp. Failure due to Acute Covid 19 Viral Pneumonitis during the ongoing 2020 Covid 19 Pandemic  -Unvaccinated, he is a critically ill on presentation required intubation.  He was successfully extubated after 48 hours, with increased oxygen requirement initially, he is currently back at his baseline oxygen requirement, report requiring 2 L at rest, 4 L with activity. -He is extubated 10/17. -Treated initially with IV steroids, he will be discharged on home dose prednisone 20 mg oral daily (this was confirmed with with the patient that he is at current dose, he was given a refill for next 30 days till he is seen by PCP) -He was treated with remdesivir during hospital stay, as well he was treated with baricitinib as well -Was encouraged to take his incentive spirometry and flutter valve at home and continue using them .   COPD exacerbation -Treated with antibiotics for 5 days, continue with bronchodilators, continue with steroid  .  Hypertension -Continue with home dose metoprolol, and Cozaar  Diabetes mellitus -We will hold home dose Metformin, will keep on sliding scale during hospital stay, A1c is 6.8  History of alcohol abuse -No evidence of withdrawal during hospital stay, continue with thiamine and folic acid on  discharge  Discharge Condition:  stable   Patient states his breathing is back at baseline he states he is no longer drinking alcohol he states his blood sugars have been in the 100 range he complains of his left hand feeling numb and is not able to sleep at night due to agitation   11/13/19 This patient was seen in return follow-up after had been seen by way of a telephone visit a week ago.  He was just hospitalized note discharge summary is as above.  He had a Covid pneumonia and respiratory failure was on mechanical ventilator.  Today he comes in on 4 L oxygen nighttime only been on room air today is 95%.  He has advanced COPD.  He had significant alcohol abuse he states he is no longer drinking alcohol.  He still complains of bilateral rashes on the arms and the umbilical area.  See shortness of breath assessment below  11/19/2019 This patient is seen again in short-term follow-up after having just been seen last week.  He came to the emergency room on 12 November in acute distress.  He was given high-dose IV steroids intensive neb treatments and discharged.  The patient states since taking the Diflucan and triamcinolone cream his rash is improved on his arms.  He states his breathing is getting back to baseline.  He still using oxygen 4 L at bedtime.  He still has thick tenacious mucus with thick yellow mucus.  He is off all antibiotics.  He is on prednisone 40 mg daily.  The patient continues to adamantly refuse leaving his current housing.  He is not regularly checking his blood sugar at home.  See assessment below  12/03/2019 Short term f/u COPD   No smoking since 2014.  Still in current housing. No ETOH use  The patient today states his breathing is somewhat improved he has no cough he is off oxygen.  He does complain of right-sided wrist pain he cannot recall if he injured the wrist.  This is been going on for 1 week.  Patient states he has some pruritus but his rash is overall  improved.  He is no longer drinking alcohol.  He is not smoking.  He is yet to receive a Covid vaccine.  He is due a flu vaccine at this visit.   02/13/2020 This patient is seen in return follow-up for COPD and bronchiectasis.  He states has not been drinking any alcohol in 3 months.  He still complains of a rash over his neck and forearms and has partially improved with steroid topically.  On arrival A1c is 6.3.  He does use oxygen 2 L at bedtime not during the day but as needed only.  He does have a portable oxygen concentrator  Patient's been compliant with his inhaled medications he still living the same housing as he had before  He has not been back to the emergency room or hospital since his last visit in November  04/14/20 This patient is seen in post hospital follow-up.  He was just in the hospital again last month for several days with hypoxemia and bronchopneumonia.  He is still having dyspnea at this time still having pain in the lower back and the upper legs.  Still has excess mucus it is difficult to expectorate.  Complains of bilateral ear stuffiness.  No other complaints.  He is maintaining his inhalers and oxygen.  He is still trying to do work around his house even though has been advised not to do so.  Below is a copy of the recent discharge summary Admit date: 03/26/2020 Discharge date: 03/28/2020  Admitted From: Home Disposition: Home  Recommendations for Outpatient Follow-up:  1. Follow up with PCP in 1 week with repeat CBC/BMP 2. Outpatient follow-up with pulmonary 3. Follow up in ED if symptoms worsen or new appear   Home Health: No Equipment/Devices: Continue supplemental home oxygen: Normally wears 2 to 4 L oxygen at home via nasal cannula  Discharge Condition: Guarded CODE STATUS: Full Diet recommendation: Heart healthy/carb modified  Brief/Interim Summary: 60 year old male with history of hypertension, COPD, chronic hypoxic respiratory failure on 2 to 4 L  oxygen via nasal cannula at home, diabetes mellitus type 2, recurrent pneumonia, COVID-19 infection in 10/2019, currently still unvaccinated advanced COVID-19 presented with worsening shortness of breath. On presentation, he required 5 L oxygen via nasal cannula; chest x-ray showed chronic bronchiectatic markings versus bronchopneumonia atthe basis. He was started on Solu-Medrol and IV antibiotics.  During the hospitalization, his respiratory status has improved.  He is feeling better.  He wants to go home today.  He will be discharged home on oral prednisone and antibiotics.  Outpatient follow-up with PCP and pulmonary.   Discharge Diagnoses:   Acute on chronic hypoxic respiratory failure COPD exacerbation Probable community-acquired bacterial pneumonia -Patient normally wears 2 to 4 L oxygen at home. He had COVID-19 infection in 10/2019 but is still not vaccinated against COVID-19 yet -Required 5 L oxygen via nasal cannula on presentation. chest x-ray showed chronic bronchiectatic markings versus bronchopneumonia atthe basis.COVID-19 and influenza testswere negative -Respiratory status improving: Currently on 2-4 L oxygen via nasal cannula. -Currently on Rocephin and Zithromax along with IV Solu-Medrol. Procalcitonin was less than 0.1 -He is feeling better.  He wants to go home today.  -Discharge home today on prednisone 40 mg daily for 7 days and Ceftin for 5 days. -Outpatient follow-up with PCP and pulmonary.  Elevated troponin -High-sensitivity troponin did not trend upwards. EKG without any ischemic changes. No chest pain. No further cardiac work-up needed  Diabetes mellitus type 2 -A1c 6.7.  Carb modified diet.  Continue home regimen.  Outpatient follow-up.  Hypertension -Continue metoprolol and losartan  History of alcohol abuse -Treated with CIWA protocol. Continue thiamine, folic acid and multivitamin.  Patient needs to abstain from alcohol  Generalized  deconditioning -Will need home health PT.  5/23 Since the last office visit this patient was readmitted in April for recurrent pneumonia and alcohol use with alcohol DTs   Patient is seen by way of a telephone visit.  He was out in his truck not at home as he is supposed to be but he is wearing his oxygen.  This visit was very difficult to accomplish and I will need to bring the patient in for direct exam.  Shortly as an overbook  Below is a copy of the discharge summary recently Admit date: 04/19/2020 Discharge date: 04/24/2020 30 Day Unplanned Readmission Risk Score  Flowsheet Row ED to Hosp-Admission (Current) from 04/19/2020 in [facility] 30 Day Unplanned Readmission Risk Score (%) 34.31 Filed at 04/24/2020 0801    This score is the patient's risk of an unplanned readmission within 30 days of being discharged (0 -100%). The score is based on dignosis, age, lab data, medications, orders, and past utilization.   Low:  0-14.9   Medium: 15-21.9   High: 22-29.9   Extreme: 30 and above   Admitted From: Home Disposition: Home  Recommendations for Outpatient Follow-up:  1. Follow up with PCP in 1-2 weeks 2. Follow-up with pulmonologist in 2 to 4 weeks 3. Please obtain BMP/CBC in one week 4. Please follow up with your PCP on the following pending results: Unresulted Labs (From admission, onward)        Start     Ordered   04/20/20 0938   Strep pneumoniae urinary antigen  Once,   R       04/20/20 0937   04/20/20 0936   Expectorated Sputum Assessment w Gram Stain, Rflx to Resp Cult  Once,   R       04/20/20 0937   04/20/20 0936   Legionella Pneumophila Serogp 1 Ur Ag  Once,   R       04/20/20 [place]: Yes Equipment/Devices: Home oxygen  Discharge Condition: Stable CODE STATUS: Full code Diet recommendation: Cardiac  Subjective: Seen and examined.  Continues to feel well.  No complaints.  No shortness of  breath.  Wants to go home.  Brief/Interim Summary: 60 year old male with COPD and chronic hypoxia on 2-4 liters of oxygen, recurrent pneumonia and hospital admissions, COVID-19 in 10/2019, HTN, diabetes type 2 presented with sudden onset worsening shortness of breath , cough , wheezingfor 1 day.Patient has been recently hospitalized in March 2022 with COPD exacerbation and pneumonia. Discharged on prednisone taper and currently taking prednisone 10 mg twice daily.  Patient is on poor health, he continues to drink alcoholdaily. In the emergency room, temperature 102. Chest x-ray consistent with right lower lobe pneumonia. Admitted byhospitalist, started on abx. Initially needed bipap for respiratory support but improved. Had worsening DT's on 4/18 requiring ICU tx and precedex gtt.transferred back under hospitalist service on 04/23/2020.  Completed 5 days of cefepime today.  Has been weaned down to his baseline home oxygen which is between 2 and 4 L.  Was evaluated by PT OT who strongly recommended 24-hour supervision or SNF if no supervision.  Patient lives alone.  Patient was strongly recommended to go to SNF however he declined that.  He wants to go home and is agreeable with home health which has been ordered for him.  He has not required any as needed benzodiazepine for his alcohol withdrawal for last 2 days.  He is being discharged in stable condition.  He has been counseled to stay away from alcohol.  Discharge Diagnoses:  Principal Problem:   Sepsis ([place]) Active Problems:   Hypertension   History of alcohol use   Chronic respiratory failure with hypoxia (HCC)   Type 2 diabetes mellitus without complication, without long-term current use of insulin (HCC)   COPD with acute exacerbation (HCC)   Lactic acid acidosis   HCAP (healthcare-associated pneumonia)   Obesity (BMI 30.0-34.9)   Alcohol withdrawal syndrome, with delirium (HCC)     Shortness of Breath This is a chronic  problem. The current episode started more than 1 year ago. The problem occurs daily (qhs shortness of breath). The problem has been unchanged. Associated symptoms include wheezing. Pertinent negatives include no chest pain, claudication, fever, headaches, hemoptysis, leg pain, leg swelling, orthopnea, PND, rash, sore throat, sputum production, syncope or vomiting. The symptoms are aggravated by any activity, lying flat and occupational exposure. Associated symptoms comments: gerd  . Risk factors include smoking. He has tried beta agonist inhalers, oral steroids and steroid inhalers for the symptoms. The treatment provided moderate relief. His past medical history is significant for COPD and pneumonia.  There is no history of asthma, CAD, DVT, a heart failure or PE.    Past Medical History:  Diagnosis Date  . Acute on chronic respiratory failure with hypoxia ([place]) 06/08/2013  . CAP (community acquired pneumonia) 05/17/2018   See admit 05/17/18 ? rml  with   covid pcr neg - rx augmentin > f/u cxr in 4-6 weeks is fine unless condition declines   . Community acquired pneumonia of right lower lobe of lung 05/17/2018   See admit 05/17/18 ? rml  with   covid pcr neg - rx augmentin > f/u cxr in 4-6 weeks is fine unless condition declines   . COPD (chronic obstructive pulmonary disease) ([place])    not on home  . Diabetes ([place])   . Dyspnea   . Hypertension   . Pneumonia 04/06/2016  . Pneumonia due to COVID-19 virus 10/19/2019  . Pneumothorax    2016, fell from ladder  . 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 Tick bite 06/03/2018     Family History  Problem Relation Age of Onset  . Heart disease Father   . COPD Sister      Social History   Socioeconomic History  . Marital status: Divorced    Spouse name: Not on file  . Number of children: Not on file  . Years of education: Not on file  . Highest education level: Not on file  Occupational History  . Occupation: Dealer  . Occupation: Painter   Tobacco Use  . Smoking status: Former Smoker    Packs/day: 1.50    Years: 40.00    Pack years: 60.00    Types: Cigarettes    Quit date: 2014    Years since quitting: 8.3  . Smokeless tobacco: Former Systems developer    Types: Secondary school teacher  . Vaping Use: Never used  Substance and Sexual Activity  . Alcohol use: Yes    Alcohol/week: 28.0 standard drinks    Types: 28 Cans of beer per week    Comment: 4 beers a night; + jitters with not drinking, denies DTs or seizures  . Drug use: Yes    Types: Marijuana    Comment: daily; quit using crack and methamphetamine about 5 months ago   . Sexual activity: Not Currently    Partners: Female  Other Topics Concern  . Not on file  Social History Narrative   Lives alone near [place] Determinants of Health   Financial Resource Strain: Not on file  Food Insecurity: Not on file  Transportation Needs: Not on file  Physical Activity: Not on file  Stress: Not on file  Social Connections: Not on file  Intimate Partner Violence: Not on file     Allergies  Allergen Reactions  . Gabapentin Other (See Comments)    hallucinations     Outpatient Medications Prior to Visit  Medication Sig Dispense Refill  . Accu-Chek Softclix Lancets lancets Use as instructed to check blood sugar once daily. 100 each 2  . albuterol (PROVENTIL) (2.5 MG/3ML) 0.083% nebulizer solution Take 3 mLs (2.5 mg total) by nebulization every 6 (six) hours as needed for wheezing or shortness of breath.    [name] albuterol (VENTOLIN HFA) 108 (90 Base) MCG/ACT inhaler Inhale 2 puffs into the lungs every 4 (four) hours as needed for wheezing or shortness of breath. 1 each 0  . Blood Glucose Monitoring Suppl (ACCU-CHEK GUIDE ME) w/Device KIT 1 each by Other route in the morning and at bedtime.    [name]  budesonide-formoterol (SYMBICORT) 160-4.5 MCG/ACT inhaler INHALE 2 PUFFS INTO THE LUNGS 2 (TWO) TIMES DAILY. 10.2 g 12  . furosemide (LASIX) 20 MG tablet TAKE 1 TABLET (20 MG TOTAL)  BY MOUTH DAILY AS NEEDED FOR EDEMA (LOWER EXTREMITY). (Patient taking differently: Take 20 mg by mouth daily as needed for edema.) 30 tablet 0  . glucose blood (TRUE METRIX BLOOD GLUCOSE TEST) test strip Use as instructed 100 each 12  . guaiFENesin (MUCINEX) 600 MG 12 hr tablet TAKE 1 TABLET (600 MG TOTAL) BY MOUTH 2 (TWO) TIMES DAILY. 30 tablet 0  . ketoconazole (NIZORAL) 2 % cream 1 (ONE) APPLICATION TO AFFECTED AREA TWICE A DAY 60 g 1  . metFORMIN (GLUCOPHAGE) 1000 MG tablet Take 1 tablet (1,000 mg total) by mouth 2 (two) times daily with a meal. 60 tablet 1  . metoprolol succinate (TOPROL-XL) 25 MG 24 hr tablet TAKE 1 TABLET (25 MG TOTAL) BY MOUTH DAILY. (Patient taking differently: Take 25 mg by mouth daily.) 90 tablet 3  . Multiple Vitamin (MULTIVITAMIN WITH MINERALS) TABS tablet Take 1 tablet by mouth daily.    . pantoprazole (PROTONIX) 40 MG tablet Take 1 tablet (40 mg total) by mouth daily. 30 tablet 6  . Respiratory Therapy Supplies (FLUTTER) DEVI Use 4 times daily (Patient taking differently: 1 each by Other route See admin instructions. Use 4 times daily) 1 each 0  . Tiotropium Bromide Monohydrate 2.5 MCG/ACT AERS INHALE 2 PUFFS INTO THE LUNGS DAILY. 4 g 6  . losartan (COZAAR) 100 MG tablet TAKE 1 TABLET (100 MG TOTAL) BY MOUTH DAILY. (Patient taking differently: Take 100 mg by mouth daily.) 90 tablet 2  . predniSONE (DELTASONE) 10 MG tablet Take 40 mg (4 tabs) daily for 3 days, followed by 30 mg (3 tabs) po daily for 3 days starting 4/25, followed by 20 mg (2 tabs) po daily for 3 days starting 4/28, followed by 10 mg po daily until seen by pulmonologist 50 tablet 0   No facility-administered medications prior to visit.     Review of Systems  Constitutional: Negative for fever.  HENT: Negative for sinus pressure, sinus pain, sore throat and trouble swallowing.   Eyes: Negative.   Respiratory: Positive for cough, shortness of breath and wheezing. Negative for hemoptysis, sputum  production and chest tightness.   Cardiovascular: Negative for chest pain, palpitations, orthopnea, claudication, leg swelling, syncope and PND.  Gastrointestinal: Negative for nausea and vomiting.  Genitourinary: Negative.   Musculoskeletal: Positive for back pain.  Skin: Negative for rash.  Neurological: Negative for headaches.  Psychiatric/Behavioral: Negative for hallucinations. The patient is not nervous/anxious and is not hyperactive.        Objective:   Physical Exam There were no vitals filed for this visit. No exam this is a phone note  Disability eval at [facility]:   Pulmonary Function Test (04/19/2019 1:42 PM EDT) Pulmonary Function Test (04/19/2019 1:42 PM EDT)  Component Value Ref Range Performed At Pathologist Signature  FVC Post 2.51 L [facility]   FEV1 Post 1.55 L [facility]   FEV1/FVC Post 61.87 % [facility]   FEF25-75% Post 0.90 L/s [facility]   PEF Post 3.42 L/s [facility]   MVV Post 42.00 L/min [facility]   FVC Pre 2.92 L [facility]   FEV1 Pre 1.57 L [facility]   FEV1/FVC Pre 54.01 % [facility]  FEF25-75% Pre 0.68 L/s [facility]   PEF Pre 4.14 L/s [facility]   MVV Pre 45.00 L/min [facility]   FVC_LLN 3.73  [facility]   FVC_Z-SCORE -2.84  [facility]   FVC_%PRED 60 % % [facility]   FVC_Z-SCORE -2.84  [facility]   FVC_%PRED 52 % % [facility]   FVC %Chng -14 % % [facility]   FEV1_LLN 2.86  [facility]   FEV1_Z-SCORE -3.78  [facility]   FEV1_%PRED 42 % % [facility]   FEV1_Z-SCORE -3.78  [facility]   FEV1_%PRED 41 % % [facility]   FEV1 %Chng -2 % % [facility]   FEV1/FVC_LLN 66  [facility]   FEV1/FVC %Chng 15 % % [facility]   FEF25-75%_LLN 1.60  [facility]   FEF25-75%_Z-SCORE -3.05  [facility]   FEF25-75%_%PRED 21 % % [facility]   FEF25-75%_Z-SCORE -3.05  [facility]   FEF25-75%_%PRED 28 % % [facility]   FEF25-75% %Chng 33 % % [facility]   PEF_LLN 7.25  [facility]   PEF_%PRED 43 % 36 % % [facility]   PEF %Chng -17 % % [facility]   MVV_LLN 130  [facility]   MVV_%PRED 35 % 32 % % [facility]   MVV %Chng -7 % % [facility]     Media Information    Document Information  Photos    11/02/2018 14:43  Attached To:  [name]  Source Information  [name], MD  [facility]   Pertinent Labs, Studies, and Procedures:  DG Chest Portable 1 View  Result Date: 09/22/2019 CLINICAL DATA:  Dyspnea. COPD exacerbation. Chest pain and shortness of breath. EXAM: PORTABLE CHEST 1 VIEW COMPARISON:  Cyst 07/05/2019 FINDINGS: Stable cardiomediastinal contours. Chronic blunting of the left costophrenic angle due to pleuroparenchymal scarring. Changes of emphysema noted with asymmetric increase hazy opacity within the right lung base concerning for pneumonia. Numerous remote bilateral rib fracture deformities. IMPRESSION: 1. Increased hazy opacity within the right lung base concerning for pneumonia. 2. Emphysema. Electronically Signed   By: Kerby Moors M.D.   On: 09/22/2019 04:20     BMP Latest Ref Rng & Units 04/24/2020 04/23/2020 04/21/2020  Glucose 70 - 99 mg/dL 121(H) 113(H) 157(H)  BUN 6 - 20 mg/dL 26(H) 31(H) 23(H)  Creatinine 0.61 - 1.24 mg/dL 0.69 0.73 0.87  BUN/Creat Ratio 9 - 20 - - -  Sodium 135 -  145 mmol/L 139 141 140  Potassium 3.5 - 5.1 mmol/L 4.5 4.2 4.9  Chloride 98 - 111 mmol/L 103 106 105  CO2 22 - 32 mmol/L _0 Calcium 8.9 - 10.3 mg/dL 9.0 8.9 9.3   Hepatic Function Panel     Component Value Date/Time   PROT 6.5 04/19/2020 0705   PROT 5.9 (L) 07/23/2019 0900   ALBUMIN 4.0 04/19/2020 0705   ALBUMIN 4.0 07/23/2019 0900   AST 31 04/19/2020 0705   ALT 29 04/19/2020 0705   ALT 62 (H) 06/23/2016 1226   ALKPHOS 43 04/19/2020 0705   BILITOT 0.6 04/19/2020 0705   BILITOT <0.2 07/23/2019 0900  BILIDIR <0.1 10/20/2019 0050   BILIDIR 0.12 04/16/2016 1601   IBILI NOT CALCULATED 10/20/2019 0050   CBC Latest Ref Rng & Units 04/22/2020 04/21/2020 04/20/2020  WBC 4.0 - 10.5 K/uL 12.3(H) 17.0(H) 17.2(H)  Hemoglobin 13.0 - 17.0 g/dL 12.3(L) 11.3(L) 11.7(L)  Hematocrit 39.0 - 52.0 % 39.3 36.7(L) 37.8(L)  Platelets 150 - 400 K/uL 261 284 254       Assessment & Plan:  I personally reviewed all images and lab data in the [facility] system as well as any outside material available during this office visit and agree with the  radiology impressions.   Chronic respiratory failure with hypoxia (HCC) Chronic respiratory failure with hypoxemia  COPD with acute exacerbation (HCC) Recurrent COPD exacerbation with HCAP  HCAP (healthcare-associated pneumonia) Finishing current course of antibiotics  Type 2 diabetes mellitus without complication, without long-term current use of insulin (HCC) Difficult to control with prednisone therapy  Alcohol withdrawal syndrome, with delirium ([place]) Recurrent alcohol abuse still persisting  Palliative care encounter The patient is in the palliative care program he is still a full code   Diagnoses and all orders for this visit:  Essential hypertension -     losartan (COZAAR) 100 MG tablet; Take 1 tablet (100 mg total) by mouth daily.  Chronic respiratory failure with hypoxia (HCC)  COPD with acute exacerbation ([place])  HCAP  (healthcare-associated pneumonia)  Type 2 diabetes mellitus without complication, without long-term current use of insulin (HCC)  Alcohol withdrawal syndrome, with delirium ([place])  Palliative care encounter  Other orders -     predniSONE (DELTASONE) 10 MG tablet; Take 4 tablets daily for 5 days then 2 tablets a day and stay    I spent 23 minutes on this patient reviewing his hospital records and going over patient instructions and examining him in complex medical decision making   Follow Up Instructions: Patient knows a direct office exam will occur in the next 2 weeks I have increased his prednisone dosing he knows refill sent   I discussed the assessment and treatment plan with the patient. The patient was provided an opportunity to ask questions and all were answered. The patient agreed with the plan and demonstrated an understanding of the instructions.   The patient was advised to call back or seek an in-person evaluation if the symptoms worsen or if the condition fails to improve as anticipated.  I provided 23 minutes of non-face-to-face time during this encounter  including  median intraservice time , review of notes, labs, imaging, medications  and explaining diagnosis and management to the patient .    Asencion Noble, MD

## 2020-05-26 ENCOUNTER — Other Ambulatory Visit: Payer: Self-pay

## 2020-05-26 ENCOUNTER — Encounter: Payer: Self-pay | Admitting: Critical Care Medicine

## 2020-05-26 ENCOUNTER — Ambulatory Visit: Payer: MEDICAID | Attending: Critical Care Medicine | Admitting: Critical Care Medicine

## 2020-05-26 DIAGNOSIS — J9611 Chronic respiratory failure with hypoxia: Secondary | ICD-10-CM

## 2020-05-26 DIAGNOSIS — F10931 Alcohol use, unspecified with withdrawal delirium: Secondary | ICD-10-CM

## 2020-05-26 DIAGNOSIS — J441 Chronic obstructive pulmonary disease with (acute) exacerbation: Secondary | ICD-10-CM | POA: Diagnosis not present

## 2020-05-26 DIAGNOSIS — I10 Essential (primary) hypertension: Secondary | ICD-10-CM

## 2020-05-26 DIAGNOSIS — J189 Pneumonia, unspecified organism: Secondary | ICD-10-CM | POA: Diagnosis not present

## 2020-05-26 DIAGNOSIS — E119 Type 2 diabetes mellitus without complications: Secondary | ICD-10-CM

## 2020-05-26 DIAGNOSIS — F10231 Alcohol dependence with withdrawal delirium: Secondary | ICD-10-CM

## 2020-05-26 DIAGNOSIS — Z515 Encounter for palliative care: Secondary | ICD-10-CM

## 2020-05-26 MED ORDER — LOSARTAN POTASSIUM 100 MG PO TABS
100.0000 mg | ORAL_TABLET | Freq: Every day | ORAL | 4 refills | Status: DC
  Filled 2020-05-26 – 2020-07-08 (×2): qty 60, 60d supply, fill #0

## 2020-05-26 MED ORDER — PREDNISONE 10 MG PO TABS
ORAL_TABLET | ORAL | 1 refills | Status: DC
  Filled 2020-05-26: qty 60, 25d supply, fill #0

## 2020-05-26 NOTE — Assessment & Plan Note (Signed)
Difficult to control with prednisone therapy

## 2020-05-26 NOTE — Assessment & Plan Note (Signed)
Recurrent alcohol abuse still persisting

## 2020-05-26 NOTE — Assessment & Plan Note (Signed)
Recurrent COPD exacerbation with HCAP

## 2020-05-26 NOTE — Assessment & Plan Note (Signed)
Chronic respiratory failure with hypoxemia 

## 2020-05-26 NOTE — Assessment & Plan Note (Signed)
The patient is in the palliative care program he is still a full code

## 2020-05-26 NOTE — Assessment & Plan Note (Signed)
Finishing current course of antibiotics

## 2020-05-27 ENCOUNTER — Other Ambulatory Visit: Payer: Self-pay

## 2020-05-28 ENCOUNTER — Other Ambulatory Visit: Payer: Self-pay

## 2020-06-10 ENCOUNTER — Telehealth: Payer: Self-pay

## 2020-06-10 ENCOUNTER — Other Ambulatory Visit: Payer: Self-pay

## 2020-06-10 ENCOUNTER — Encounter: Payer: Self-pay | Admitting: Critical Care Medicine

## 2020-06-10 ENCOUNTER — Ambulatory Visit: Payer: Medicaid Other | Attending: Critical Care Medicine | Admitting: Critical Care Medicine

## 2020-06-10 VITALS — BP 126/76 | HR 97 | Ht 68.0 in | Wt 204.4 lb

## 2020-06-10 DIAGNOSIS — F10231 Alcohol dependence with withdrawal delirium: Secondary | ICD-10-CM

## 2020-06-10 DIAGNOSIS — F10931 Alcohol use, unspecified with withdrawal delirium: Secondary | ICD-10-CM

## 2020-06-10 DIAGNOSIS — I10 Essential (primary) hypertension: Secondary | ICD-10-CM

## 2020-06-10 DIAGNOSIS — E119 Type 2 diabetes mellitus without complications: Secondary | ICD-10-CM | POA: Diagnosis not present

## 2020-06-10 DIAGNOSIS — J9611 Chronic respiratory failure with hypoxia: Secondary | ICD-10-CM

## 2020-06-10 MED ORDER — TIOTROPIUM BROMIDE MONOHYDRATE 2.5 MCG/ACT IN AERS
2.0000 | INHALATION_SPRAY | Freq: Every day | RESPIRATORY_TRACT | 6 refills | Status: DC
  Filled 2020-06-10: qty 4, 30d supply, fill #0
  Filled 2020-07-08: qty 4, 30d supply, fill #1

## 2020-06-10 MED ORDER — ALBUTEROL SULFATE (2.5 MG/3ML) 0.083% IN NEBU: 2.5000 mg | INHALATION_SOLUTION | Freq: Four times a day (QID) | RESPIRATORY_TRACT | Status: DC | PRN

## 2020-06-10 MED ORDER — PANTOPRAZOLE SODIUM 40 MG PO TBEC
40.0000 mg | DELAYED_RELEASE_TABLET | Freq: Every day | ORAL | 6 refills | Status: DC
  Filled 2020-06-10: qty 30, 30d supply, fill #0

## 2020-06-10 MED ORDER — FUROSEMIDE 20 MG PO TABS
20.0000 mg | ORAL_TABLET | Freq: Every day | ORAL | 2 refills | Status: DC | PRN
  Filled 2020-06-10: qty 30, 30d supply, fill #0
  Filled 2020-07-11: qty 30, 30d supply, fill #1
  Filled 2020-07-21 – 2020-08-18 (×2): qty 30, 30d supply, fill #2

## 2020-06-10 MED ORDER — PREDNISONE 10 MG PO TABS
20.0000 mg | ORAL_TABLET | Freq: Every day | ORAL | 3 refills | Status: DC
  Filled 2020-06-10: qty 60, 30d supply, fill #0

## 2020-06-10 MED ORDER — METOPROLOL SUCCINATE ER 25 MG PO TB24
25.0000 mg | ORAL_TABLET | Freq: Every day | ORAL | 3 refills | Status: DC
  Filled 2020-06-10: qty 90, 90d supply, fill #0

## 2020-06-10 MED ORDER — BUDESONIDE-FORMOTEROL FUMARATE 160-4.5 MCG/ACT IN AERO
2.0000 | INHALATION_SPRAY | Freq: Two times a day (BID) | RESPIRATORY_TRACT | 12 refills | Status: DC
  Filled 2020-06-10: qty 10.2, 30d supply, fill #0

## 2020-06-10 MED ORDER — METFORMIN HCL 1000 MG PO TABS
1000.0000 mg | ORAL_TABLET | Freq: Two times a day (BID) | ORAL | 1 refills | Status: DC
  Filled 2020-06-10: qty 60, 30d supply, fill #0

## 2020-06-10 NOTE — Assessment & Plan Note (Signed)
Hypertension well controlled he is currently out of metoprolol this will be refilled
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
## 2020-06-10 NOTE — Patient Instructions (Signed)
No change in medications refill sent to our pharmacy  We will get the electrical cord replaced to your concentrator  Stay on oxygen 2 L at bedtime you can be off during the day just use as needed during the daytime  Return to see [name] 2 months

## 2020-06-10 NOTE — Progress Notes (Signed)
Subjective:    Patient ID: James [name], male    DOB: 15-Dec-1960, 60 y.o.   MRN: [id number] History of Present Illness: 08/23/18 60 y.o.M with advanced Copd   I saw this patient a week ago and prescribed antibiotics and prednisone for what I perceived to be persisting pneumonia.  The patient had a slight improvement but then worsened and came to the emergency room the end of the week and was admitted for 3 days for COPD exacerbation and lower lobe pneumonia.  Chest x-ray did show persistent infiltrates in the lower lobes.  The patient is now discharged and returns to the office for post hospital follow-up.  Below is the discharge summary Dc summary : Admit date: 07/31/2018 Discharge date: 08/01/2018  Time spent: 45 minutes  Recommendations for Outpatient Follow-up:  Patient will be discharged to home.  Patient will need to follow up with primary care provider within one week of discharge.  Patient should continue medications as prescribed.  Patient should follow a heart healthy diet.   Discharge Diagnoses:  Acute on chronic respiratory failure with hypoxia secondary to COPD Exacerbation and pneumonia Essential hypertension  Discharge Condition: Stable  Diet recommendation: heart healthy  Filed Weights  07/31/18 0231 07/31/18 0602 Weight: 85.3 kg 83.4 kg   History of present illness:  on 59/27/2020 by James [name] a 60 y.o.malewith medical history significant ofCOPD. Patient has had 3 prior admits for COPD / PNA thus far this year, most recently in May.  Patient has been being treated for COPD exacerbation and BLL CAP as outpatient by Dr. Joya [name] with levaquin and prednisone. Just finished levaquin yesterday. Prednisone remained at $RemoveBef'40mg'ZiJfpROXHZ$  daily with plans to initiate a slow taper.  Despite this, patient developed severe SOB and respiratory distress this evening. EMS called and patient noted to be satting 80% on RA. He is not on chronic O2 for  his COPD.  Hospital Course:  Acute on chronic respiratory failure with hypoxia secondary to COPD Exacerbation and pneumonia -Patient noted to have oxygen saturations in the 80s on room air -Chest x-ray noted for pneumonia -Was recently placed on Levaquin and steroids by PCP approximately 1 week ago -On admission, patient continued to have wheezing well into his hospital stay day 1.  Patient currently still has some wheezing however has improved. -Was placed on Solu-Medrol, along with nebulizer treatments, azithromycin and ceftriaxone -Patient did require oxygen upon admission however has been able to ambulate and maintain oxygen saturations in the high 90s on room air. -Will discharge patient with prednisone taper, azithromycin and Ceftin. -Given that patient had oxygen saturations in the 80s on admission, it was thought that he would require several days in the hospital. Patient actually improved quicker than expected.  Essential hypertension -Continue losartan  Since discharge the patient is tried to go back to work but has extreme difficulty in the heat with this.  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  10/05/2018 This patient's not been seen since the end of July he had failed several follow-up visits.  He did see Dr. Shyrl [name] end of August who did not make major  changes in his medication program.  He is to remain on [name] and Incruse.  The patient after being admitted in July got readmitted early August and then again in early September.  Discharge summaries are as noted below.  During the August admission the patient underwent bronchoscopy and had mucous plugging  removed the culture showed normal flora Note this patient has not been actively smoking recently Admit date: 08/09/2018 Discharge date: 08/11/2018  Discharge Diagnoses:  Acute on chronic hypoxic respiratory failure/sepsis due to multifocal pneumonia COPD Alcohol/marijuana use Essential hypertension  History of present illness:  on 59/05/2018 by James [name] a 60 y.o.malewith medical history significant ofCOPD and ETOH dependence presenting with respiratory distress.He woke up about 530 this AM and couldn't breathe. This just keeps happening. He felt similarly when hospitalized last week. He has been doing fine until today. No h/o OSA, not on CPAP. Not on home O2. +cough, productive of yellowish sputum. It is sometimes hard to get the sputum up. No fever. +wheezing since his last hospitalization. Quit smoking in 2014. He lives in a non-air-conditioned log cabin.  He was hospitalized from 7/27-28 for acute on chronic respiratory failure associated with COPD exacerbation and PNA. This was his 4th admission this year. He was seen by Dr. Joya [name] on 7/30 with plan for completion of course of Keflex and prednisone, as well as prednisone taper.  Hospital Course:  Acute on chronic hypoxic respiratory failure/sepsis due to multifocal pneumonia -Was recently hospitalized in late July for pneumonia and COPD exacerbation. Was discharged with antibiotics as well as prednisone taper. Patient followed up with Dr. Joya [name] on 08/03/2018 with completion of Keflex and prednisone as well. -This is his fourth admission this year for similar presentation -Patient presented with cough and shortness of breath -Chest x-rayon admissionfindings consistent with right-sided multifocal pneumonia -CTA chest showed multifocal pneumonia on the right. COPD including emphysema. Negative for PE. -COVID negative -Upon admission, patient had leukocytosis, fever, tachycardia, tachypnea-  all resolving  -Noted to be hypoxic 88% on room air in the ED -Urine strep pneumoniaand legionellaantigensnegative -Sputum culture: Few GPC, GVR, rare yeast -Blood cultures show no growth -was placed on vancomycin, Zosyn, Solu-Medrol, supplemental oxygen -S/p bronchoscopy 8/6, pending culture results -CXR obtained after bronch on 8/6: almost complete clearing of the hazy infiltrates in the right lung -Discussed with Dr. Tamala Julian (pulm), recommended prednisone taper starting with prednisone $RemoveBeforeD'40mg'bDRDcUBpvLJaUS$  daily x 1 week, taper as an outpatient, along with Augmentin. He will follow up with the patient in one week. Given that infection cleared on xray so quickly, question if this is inflammatory vs infection.  -patient able to ambulate today without oxygen and maintained oxygen saturations in the mid 90s- therefore patient does not qualify for home oxygen -patient also had a swallow eval- mild aspiration risk  COPD -Continue nebulizer treatments, steroids, inhalers -Incentive spirometry, flutter valve  Alcohol/marijuana use -Discussed cessation -was placed on continue CIWA protocol  Essential hypertension -Continue Cozaar  Consultants PCCM- pulmonology   Procedures Bronchoscopy   Recommendations for Outpatient Follow-up:  1. Follow up with PCP in 1-2 weeks 2. Please obtain BMP/CBC in one week your next doctors visit.  3. Augmentin and azithromycin for 5 days 4. Advised to continue using bronchodilators every 4-6 hours for the next 4-5 days and then transition to as needed. 5. Advised to take his home prednisone, for next 3 days advised to increase it to 40 mg twice daily then resume back is 20 mg daily  Discharge  Condition: Stable CODE STATUS: Full code Diet recommendation: 2 g salt   DC summary from 09/11/18  Adm 9/7  D/C 09/13/18 Brief/Interim Summary: James [name] a 60 y.o.malewith history ofCOPD on prednisone, HTN, alcohol use disorder, marijuana use,tobacco user.  Patient presented secondary to dyspnea and found to have evidence of pneumonia and a COPD exacerbation in addition to sepsis physiology on admission.  There is concerns of possible right lower lobe pneumonia therefore started on Unasyn and azithromycin due to concerns of aspiration.  This was transitioned to Augmentin and azithromycin for total of 5 days at time of discharge.  Secondary to infection, he was having COPD exacerbation therefore on bronchodilators.  He started improving and on the day of discharge he was adamant about going home as he was feeling much better.  He was still having abnormal breath sounds and cough therefore I had requested him to stay another day in the hospital but still wanted to go home.  Instructions were given to him take 40 mg of prednisone for next 3 days and then he can resume back his 20 mg daily.  Advised... home bronchodilators periodically every 4-6 hours for next 4-5 days and then transition to as needed. Advised to follow-up with his PCP in about 1 week.   Discharge Diagnoses:  Active Problems:   Hypertension   COPD exacerbation ([place])   Marijuana abuse   Pneumonia   Sepsis ([place])  Acute respiratory distress secondary to right lower lobe pneumonia, community-acquired/aspiration Acute mild exacerbation of COPD Concern for possible aspiration.  Unasyn and azithromycin transition to Augmentin and azithromycin.  We will give him 5 more day course at the time of discharge 40 mg of prednisone for next 3 days then transition to 20 mg daily which is his home dose Advised to use bronchodilators.3  Alcohol use disorder Counseled to quit drinking alcohol.  Advised to continue thiamine, folate and multivitamin  Weakness -He did well with physical therapy.  Tobacco use Marijuana use Cessation discussed on admission  Consultations:  None  Subjective: Still having some cough and congestion but he is adamant about going home today as he is feeling much  better than from admission.  I recommended he stay in the hospital for at least 1 more day to help him feel even better.  He wished to go home. Ambulating in the hallway without additional saturating greater than 90% on room air.   Since discharge this patient went to urgent care on 21 September with an outbreak of shingles over his left lower back.  He is taken a course of valacyclovir and prednisone.  He has severe pain.  He was given gabapentin but this caused hallucinations and then we tried tramadol but this caused severe nausea and vomiting.  Apparently the patient cannot take Tylenol or nonsteroidal anti-inflammatories as well.  The patient states his breathing is been labored.  He still has productive cough of white mucus.  He has no fever.  He has significant back pain because of the shingles. The patient is maintaining the [facility] and Incruse as well as his antihypertensive medications and vitamins     10/12/2018  This patient is seen in return follow-up for acute shingles with postherpetic neuropathy, recent pneumonia, COPD exacerbation, and hypertension.  Patient is markedly improved from the last visit.  He has less cough less shortness of breath.  He has improved pain around the area of the shingles.  He is taking amitriptyline 25 mg at bedtime  The patient  notes less pain.  He does state he had been using the tramadol without nausea or vomiting.  I asked if patient would be interested in the para medicine program and expressed interest in the community para medicine program  11/23/2018 Patient seen in return follow-up and has noted increasing shortness of breath and we had to resume prednisone this patient.  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  He does maintain his inhalers.  He is drinking 4 beers a night.  He is not smoking.  Note the patient continues to live in a home that has standing water under the foundation and in several the rooms and mold on the carpet.  We have  connected this patient with legal aid to see if they can assist him in getting into better housing.  The patient's not had previous allergy testing   12/13/2018 The patient notes increased shortness of breath, wheezing and cough productive of thick yellow mucus.  He is using the flutter valve but is not being is productive with this.  He denies chest pain or fever.  He is down to 2 a day on the prednisone.  He is maintaining his inhalers. Apparently there is repair is being done on the patient's home and he is decided to stay in the home  02/27/2019 This patient is seen in return follow-up for COPD.  The patient has significant mold exposure in the home.  There has been some mitigation of this but is not complete.  The patient just was in the urgent care for COPD exacerbation a week ago treated with a course of antibiotics and prednisone.  He is now off prednisone. Patient states the pain in the back has resolved however he is still having increased shortness of breath.  He still having productive cough of thick yellow mucus.  05/01/2019  Since the last OV the patient has been admitted for Copd flare again. Below is the discharge summary Dc summary:  Admit Date: 04/23/2019 Discharge date: 04/27/2019  Recommendations for Outpatient Follow-up:  1. Follow up with PCP in 1-2 weeks 2. Please obtain CMP/CBC in one week 3. Please repeat chest x-ray in the next 6 to 8 weeks to document resolution of infiltrates, if not may require further work-up.     Brief Narrative: Patient is a40 y.o.malewith history of COPD, intellectual disability, HTN who presented with shortness of breath-found to have acute hypoxic respiratory failure secondary to pneumonia and COPD exacerbation. Patient initially required BiPAP on admission. See below for further details.  Significant events: 4/19>> admit to [facility] for severe hypoxemia requiring BiPAP 4/20>> liberated from BiPAP  Antimicrobial  therapy: Rocephin 4/18>>4/23 Zithromax 4/18>>4/22  Microbiology data: Blood culture: 4/19>> negative  Procedures : None  Consults: None  Brief Hospital Course: Acute hypoxic/hypercarbic respiratory failure secondary to COPD exacerbation and pneumonia:Improved-initially required BiPAP on admission-titrated off BiPAP on 4/20.Now on room air with stable O2 saturations.  Feels much better-treated with Rocephin and Zithromax-we will transition to [facility] for a few more days on discharge.  He will resume his usual inhaler regimen-he will be placed on 20 mg of p.o. prednisone until he is seen by Dr. Joya [name].  PCP to repeat a chest x-ray in 6 to 8 weeks to document resolution of infiltrates-if not he may require further work-up to rule out underlying malignancy.  (Per patient's sister-patient probably has been taking more prednisone than he should be-he is unable to read-and is not able to follow directions)  HTN: BPstable-continue losartan  EtOH abuse:No signs of  withdrawal-managed with  Ativan per protocol. Per patient's sister-he drinks around 2-3 bottles of beer on a daily basis.  Intellectual disability:Spoke at length with patient's sister ([name]) today-patient isa illiterate-and has been living on his own for all his life. His other sister lives a [place] and checks on him daily. Sister acknowledges poor living conditions-but apparently this is the way that patient has lived all his life-and chooses to live this way. 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 Patient is currently managing all his finances. [name] does acknowledge that patient will not let any home health services come to his house-he will not go to SNF. Difficult situation-but even though patient is an illiterate/intellectual disability-he understand that he is somewhat deconditioned from his acute illness-and is in need of some help-he does not want therapy services-he understands the risk of falls from  being weak.  This MD had numerous discussions with him regarding his appropriate disposition-all he wants to do is go home-his sister-[name] wants to have him come to her place in [place], [place] for a few weeks-however the patient really does not want to go there-and wants to go home.  I have discussed at length with social worker as well-at this point he is being discharged home at his own request.  His family will keep an eye on him.   Nutrition Problem: Nutrition Problem: Increased nutrient needs Etiology: chronic illness(COPD) Signs/Symptoms: estimated needs Interventions: Ensure Enlive (each supplement provides 350kcal and 20 grams of protein)  Discharge Diagnoses:  Principal Problem:   Acute on chronic respiratory failure with hypoxia and hypercapnia (HCC) Active Problems:   Hypertension   COPD exacerbation ([place])   Community acquired pneumonia of right lower lobe of lung   Marijuana abuse   Intellectual disability  Since discharge the patient has completed his antibiotics for his right lower lobe pneumonia.  He is on tapering prednisone.  He has developed a rash on his arms and face.  The rash is pruritic in nature.  He has been maintaining his medications as prescribed.   07/23/2019 Since the last visit this patient was readmitted to the hospital between 30 June and 3 July.  Below is a copy of the discharge summary Discharge Diagnosis: 1. Acute hypoxic respiratory 2/2 COPD exacerbation vs pneumonitis 2. GERD 3. EtOH history 4. HTN  Discharge Medications: Allergies as of 07/07/2019     Reactions  Gabapentin Other (See Comments)  hallucinations  Disposition and follow-up:   James [name] was discharged from [facility] in Stable condition.  At the hospital follow up visit please address:  1.  COPD exacerbation: Continue daily Albuterol, Dulera, Spiriva, and prednisone 40 mg daily. Sent home with 1 additional day of azithromycin.  Discharged on 2L [place]. F/u allergy labs.  GERD: Severe reflux noted on esophagram, started on pantoprazole daily, may need GI follow up.   2.  Labs / imaging needed at time of follow-up: None  3.  Pending labs/ test needing follow-up: IgE, allergen panel for mold, hypersensitivity pneumonitis  Note since discharge the patient's breathing is back to baseline.  The patient does have some wheezing and minimal productive cough.  Noticed blood pressures ranged up at this visit.  He is maintaining Dulera Spiriva as before he has been maintained on prednisone 40 mg daily and also discharged on 2 L oxygen.  He no longer is actively working at this time.  Note his mold assay did show a mild hypersensitivity to some yeast.  08/23/2019 Patient  returns today in follow-up showing increased shortness of breath.  He continues to work outdoors in the heat and this is exacerbating his condition despite being on 20 mg of prednisone daily.  He notes thick yellow mucus that he is coughing up.  He notes increased wheeze.  He has ran out of his Spiriva        10/02/2019 This patient returns after having been in the hospital again for 4 days with discharge summary as noted below. Patient returns today with continued shortness of breath and productive cough. Mucus is still thick and yellow and difficult to raise. He has chest tightness. He still drinking 6-7 beers daily. He is no longer smoking. He is now on oxygen 4 L continuous. He has had his Medicaid approved and is now fully declared disabled. 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 The landlord has a complex relationship with this patient. Patient lives rent free and a home on the landlord's property. The home is not in good repair as documented previously.   Date of Admission: 09/22/2019  3:42 AM Date of Discharge: 09/25/2019 Attending Physician: Lenice Pressman, MD PhD  Discharge Diagnosis: 1. COPD exacerbation, underlying  bronchiectasis 2. Alcohol use disorder 3. Skin rash    Disposition and follow-up:   [name] was discharged from [facility] in Stable condition.  At the hospital follow up visit please address:  1.  Follow up      A. COPD exacerbation, recurrent pneumonias, underlying bronchiectasis - hx of recurrent pneumonia, see hospital course for details on prior workup, follow up with his pulmonologist      B. Alcohol use disorder - had mild withdrawal symptoms, motivated to quit, consider pharmacologic assistance such as naltrexone      C. Skin rash - not improving with triamcinolone, referred to dermatology  2.  Labs / imaging needed at time of follow-up: skin biopsy  3.  Pending labs/ test needing follow-up: none  Follow-up Appointments:  Hospital Course by problem list:  COPDexacerbation Recurrent pneumonia Bronchiectasis on bronchoscopy Presents with increased SOB, but cough baseline and not productive. He has had similar symptoms many times previously. PFTs in April 2021 with FEV1/FVC which was 54.01% of expected. CXR this admission with vague R lower lung field opacity, same location as opacity on prior CXRs. Has had recurrent pneumonia as far back as 2015. bronchoscopy 08/10/2018 for recurrent PNU demonstrating severe chronic bronchitis and bronchiectasis,neutrophil predominance and transbronchial biopsies with benign lung tissue with inflammation. CT on 07/05/19 with no ILD, no centrally obstructing lesion. Esophagram on 07/06/19 with no stenosis but severe reflux which could be contributing to COPD. Other pulm workup significant for IgE WNL, allergen profile for mold equivocal for Candida albicans but negative for all others, negative hypersensitivity pneumonitis panel. Has very good outpatient follow up with pulmonology. Is on Dulera, Spiriva, and albuterol and also prednisone $RemoveBeforeD'40mg'VCGKHMlGRwUDVp$  daily, states he has been compliant with all these, though at one point earlier  this admission denied steroid use. Treated with ceftriaxone, azithromycin, and prednisone initially with rapid improvement in SOB. No fevers this admission, cough remains unproductive so did not culture. Discharged on his home Dulera, Spiriva, albuterol, Prednisone. Discharged with oral azithromycin to complete 5 day course.   Alcohol Use Disorder Reports using 2x 24oz beers per day, which is decreased from prior. No hx of withdrawal seizures. CIWA scores max of 15 indicating mild withdrawal, requiring relatively low amounts  of Ativan. On day of discharge had scores of up to 5. Patient is motivated to quit drinking because "I want to live longer." Discharged with PO thiamine.  Skin rash Reports a rash for the past several months on his abdomen and both arms. His PCP has been treating this with triamcinolone, but this has not really helped. Referral provided to dermatology for biopsy/further assessment.  Note the patient's rash on both forearms is still persistent and triamcinolone has not helped  This patient will need a dermatology referral as per discharge summary. Note the patient is still actively drinking alcohol. See shortness of breath assessment below  11/06/2019 This patient is seen by way of a telephone visit and post hospital.  He was just admitted again for recurrent pneumonia this time with Covid infection.  This patient was intubated was severely ill.  He is now on oxygen 4 L continuous and 40 mg daily of prednisone.  He also has a allergic dermatitis rash.  This has not been responsive to steroids and is quite pruritic despite hydroxyzine when seen recently at urgent care.  I went over with this patient at this visit the need to receive assisted care but he refuses and wishes to stay in his current living arrangement which has very poor environment  Below is a copy of the recent discharge summary  Admission date:  10/19/2019  Admitting Physician  No admitting provider for  patient encounter.  Discharge Date:  10/26/2019   Primary MD  [name], MD  Recommendations for primary care physician for things to follow:  -Patient instructed to follow with PCP/pulmonary as an outpatient, appointment will be rescheduled till he is off quarantine as discussed with the patient and staff. -Please check CBC, CMP during next visit.  Admission Diagnosis  COPD exacerbation (HCC) (J44.1) Acute on chronic respiratory failure with hypoxia (HCC) (J96.21) Pneumonia due to COVID-19 virus (U07.1, J12.82)   Discharge Diagnosis  COPD exacerbation (HCC) (J44.1) Acute on chronic respiratory failure with hypoxia (HCC) (J96.21) Pneumonia due to COVID-19 virus (U07.1, J12.82)    Active Problems:   Acute on chronic respiratory failure with hypoxia (HCC)   Pneumonia due to COVID-19 virus      Past Medical History: Diagnosis Date . Acute on chronic respiratory failure with hypoxia ([place]) 06/08/2013 . CAP (community acquired pneumonia) 05/17/2018  See admit 05/17/18 ? rml  with   covid pcr neg - rx augmentin > f/u cxr in 4-6 weeks is fine unless condition declines  . Community acquired pneumonia of right lower lobe of lung 05/17/2018  See admit 05/17/18 ? rml  with   covid pcr neg - rx augmentin > f/u cxr in 4-6 weeks is fine unless condition declines  . COPD (chronic obstructive pulmonary disease) ([place])   not on home . Diabetes ([place])  . Dyspnea  . Hypertension  . Pneumonia 04/06/2016 . Pneumothorax   2016, fell from ladder . 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 Tick bite 06/03/2018   Past Surgical History: Procedure Laterality Date . VIDEO ASSISTED THORACOSCOPY (VATS)/THOROCOTOMY Left 06/11/2013  Procedure: VIDEO ASSISTED THORACOSCOPY (VATS)/THOROCOTOMY;  Surgeon: Ivin Poot, MD;  Location: [facility];  Service: Thoracic;  Laterality: Left; [name] VIDEO BRONCHOSCOPY N/A 06/11/2013  Procedure: VIDEO BRONCHOSCOPY;  Surgeon: Ivin Poot, MD;  Location: [facility];  Service: Thoracic;  Laterality: N/A; . VIDEO BRONCHOSCOPY N/A 08/10/2018  Procedure: VIDEO BRONCHOSCOPY WITH FLUORO;  Surgeon: Candee Furbish, MD;  Location: [facility] ENDOSCOPY;  Service: Endoscopy;  Laterality: N/A;  History of present illness and  Hospital Course:     Kindly see H&P for history of present illness and admission details, please review complete Labs, Consult reports and Test reports for all details in brief  HPI  from the history and physical done on the day of admission by PCCM on 10/19/2019  Pt is encephelopathic; therefore, this HPI is obtained from chart review. James Robertsonis a 59 y.o.malewho has a PMH as outlined below.[facility] with significant dyspnea to the point that he could not speak in full sentences. He was placed on CPAP but did not respond; therefore, he was intubated.  After intubation, COVID returned positive.  He has been evaluated by pulmonary on prior admissions given recurrent PNA. He has bronchoscopy in Aug 2020 which showed severe bronchiectasis bilaterally, neutrophil predominance and transbronchial biopsies were benign.   Hospital Course   James Robertsonis a 59 y.o.malewho has a PMH of chronic respiratory failure, COPD, diabetes, hypertension, presented to [facility] ED 10/15 with significant dyspnea to the point that he could not speak in full sentences. He was placed on CPAP but did not respond; therefore, he was intubated.After intubation, COVID returned positive. He has been evaluated by pulmonary on prior admissions given recurrent PNA. He has bronchoscopy in Aug 2020 which showed severe bronchiectasis bilaterally, neutrophil predominance and transbronchial biopsies were benign. Patient was successfully extubated 10/17, and he was transferred to [facility] on 10/19.   Acute on chronic hypoxic Resp. Failure due to Acute Covid 19 Viral Pneumonitis during the ongoing 2020 Covid 19 Pandemic   -Unvaccinated, he is a critically ill on presentation required intubation.  He was successfully extubated after 48 hours, with increased oxygen requirement initially, he is currently back at his baseline oxygen requirement, report requiring 2 L at rest, 4 L with activity. -He is extubated 10/17. -Treated initially with IV steroids, he will be discharged on home dose prednisone 20 mg oral daily (this was confirmed with with the patient that he is at current dose, he was given a refill for next 30 days till he is seen by PCP) -He was treated with remdesivir during hospital stay, as well he was treated with baricitinib as well -Was encouraged to take his incentive spirometry and flutter valve at home and continue using them .   COPD exacerbation -Treated with antibiotics for 5 days, continue with bronchodilators, continue with steroid  .  Hypertension -Continue with home dose metoprolol, and Cozaar  Diabetes mellitus -We will hold home dose Metformin, will keep on sliding scale during hospital stay, A1c is 6.8  History of alcohol abuse -No evidence of withdrawal during hospital stay, continue with thiamine and folic acid on discharge  Discharge Condition:  stable   Patient states his breathing is back at baseline he states he is no longer drinking alcohol he states his blood sugars have been in the 100 range he complains of his left hand feeling numb and is not able to sleep at night due to agitation   11/13/19 This patient was seen in return follow-up after had been seen by way of a telephone visit a week ago.  He was just hospitalized note discharge summary is as above.  He had a Covid pneumonia and respiratory failure was on mechanical ventilator.  Today he comes in on 4 L oxygen nighttime only been on room air today is 95%.  He has advanced COPD.  He had significant alcohol abuse he states he is no longer   drinking alcohol.  He still complains of bilateral rashes on the arms and  the umbilical area.  See shortness of breath assessment below  11/19/2019 This patient is seen again in short-term follow-up after having just been seen last week.  He came to the emergency room on 12 November in acute distress.  He was given high-dose IV steroids intensive neb treatments and discharged.  The patient states since taking the Diflucan and triamcinolone cream his rash is improved on his arms.  He states his breathing is getting back to baseline.  He still using oxygen 4 L at bedtime.  He still has thick tenacious mucus with thick yellow mucus.  He is off all antibiotics.  He is on prednisone 40 mg daily.  The patient continues to adamantly refuse leaving his current housing.  He is not regularly checking his blood sugar at home.  See assessment below  12/03/2019 Short term f/u COPD   No smoking since 2014.  Still in current housing. No ETOH use  The patient today states his breathing is somewhat improved he has no cough he is off oxygen.  He does complain of right-sided wrist pain he cannot recall if he injured the wrist.  This is been going on for 1 week.  Patient states he has some pruritus but his rash is overall improved.  He is no longer drinking alcohol.  He is not smoking.  He is yet to receive a Covid vaccine.  He is due a flu vaccine at this visit.   02/13/2020 This patient is seen in return follow-up for COPD and bronchiectasis.  He states has not been drinking any alcohol in 3 months.  He still complains of a rash over his neck and forearms and has partially improved with steroid topically.  On arrival A1c is 6.3.  He does use oxygen 2 L at bedtime not during the day but as needed only.  He does have a portable oxygen concentrator  Patient's been compliant with his inhaled medications he still living the same housing as he had before  He has not been back to the emergency room or hospital since his last visit in November  04/14/20 This patient is seen in post  hospital follow-up.  He was just in the hospital again last month for several days with hypoxemia and bronchopneumonia.  He is still having dyspnea at this time still having pain in the lower back and the upper legs.  Still has excess mucus it is difficult to expectorate.  Complains of bilateral ear stuffiness.  No other complaints.  He is maintaining his inhalers and oxygen.  He is still trying to do work around his house even though has been advised not to do so.  Below is a copy of the recent discharge summary Admit date: 03/26/2020 Discharge date: 03/28/2020  Admitted From: Home Disposition: Home  Recommendations for Outpatient Follow-up:  1. Follow up with PCP in 1 week with repeat CBC/BMP 2. Outpatient follow-up with pulmonary 3. Follow up in ED if symptoms worsen or new appear   Home Health: No Equipment/Devices: Continue supplemental home oxygen: Normally wears 2 to 4 L oxygen at home via nasal cannula  Discharge Condition: Guarded CODE STATUS: Full Diet recommendation: Heart healthy/carb modified  Brief/Interim Summary: 59-year-old male with history of hypertension, COPD, chronic hypoxic respiratory failure on 2 to 4 L oxygen via nasal cannula at home, diabetes mellitus type 2, recurrent pneumonia, COVID-19 infection in 10/2019, currently still unvaccinated advanced COVID-19 presented with   worsening shortness of breath. On presentation, he required 5 L oxygen via nasal cannula; chest x-ray showed chronic bronchiectatic markings versus bronchopneumonia atthe basis. He was started on Solu-Medrol and IV antibiotics.  During the hospitalization, his respiratory status has improved.  He is feeling better.  He wants to go home today.  He will be discharged home on oral prednisone and antibiotics.  Outpatient follow-up with PCP and pulmonary.   Discharge Diagnoses:   Acute on chronic hypoxic respiratory failure COPD exacerbation Probable community-acquired bacterial  pneumonia -Patient normally wears 2 to 4 L oxygen at home. He had COVID-19 infection in 10/2019 but is still not vaccinated against COVID-19 yet -Required 5 L oxygen via nasal cannula on presentation. chest x-ray showed chronic bronchiectatic markings versus bronchopneumonia atthe basis.COVID-19 and influenza testswere negative -Respiratory status improving: Currently on 2-4 L oxygen via nasal cannula. -Currently on Rocephin and Zithromax along with IV Solu-Medrol. Procalcitonin was less than 0.1 -He is feeling better.  He wants to go home today.  -Discharge home today on prednisone 40 mg daily for 7 days and Ceftin for 5 days. -Outpatient follow-up with PCP and pulmonary.  Elevated troponin -High-sensitivity troponin did not trend upwards. EKG without any ischemic changes. No chest pain. No further cardiac work-up needed  Diabetes mellitus type 2 -A1c 6.7.  Carb modified diet.  Continue home regimen.  Outpatient follow-up.  Hypertension -Continue metoprolol and losartan  History of alcohol abuse -Treated with CIWA protocol. Continue thiamine, folic acid and multivitamin.  Patient needs to abstain from alcohol  Generalized deconditioning -Will need home health PT.  5/23 Since the last office visit this patient was readmitted in April for recurrent pneumonia and alcohol use with alcohol DTs   Patient is seen by way of a telephone visit.  He was out in his truck not at home as he is supposed to be but he is wearing his oxygen.  This visit was very difficult to accomplish and I will need to bring the patient in for direct exam.  Shortly as an overbook  Below is a copy of the discharge summary recently Admit date: 04/19/2020 Discharge date: 04/24/2020 30 Day Unplanned Readmission Risk Score  Flowsheet Row ED to Hosp-Admission (Current) from 04/19/2020 in [place] [facility] 30 Day Unplanned Readmission Risk Score (%) 34.31 Filed at 04/24/2020  0801    This score is the patient's risk of an unplanned readmission within 30 days of being discharged (0 -100%). The score is based on dignosis, age, lab data, medications, orders, and past utilization.   Low:  0-14.9   Medium: 15-21.9   High: 22-29.9   Extreme: 30 and above   Admitted From: Home Disposition: Home  Recommendations for Outpatient Follow-up:  1. Follow up with PCP in 1-2 weeks 2. Follow-up with pulmonologist in 2 to 4 weeks 3. Please obtain BMP/CBC in one week 4. Please follow up with your PCP on the following pending results: Unresulted Labs (From admission, onward)        Start     Ordered   04/20/20 0938   Strep pneumoniae urinary antigen  Once,   R       04/20/20 0937   04/20/20 0936   Expectorated Sputum Assessment w Gram Stain, Rflx to Resp Cult  Once,   R       04/20/20 0937   04/20/20 0936   Legionella Pneumophila Serogp 1 Ur Ag  Once,   R       04/20/20  [place]: Yes Equipment/Devices: Home oxygen  Discharge Condition: Stable CODE STATUS: Full code Diet recommendation: Cardiac  Subjective: Seen and examined.  Continues to feel well.  No complaints.  No shortness of breath.  Wants to go home.  Brief/Interim Summary: 60 year old male with COPD and chronic hypoxia on 2-4 liters of oxygen, recurrent pneumonia and hospital admissions, COVID-19 in 10/2019, HTN, diabetes type 2 presented with sudden onset worsening shortness of breath , cough , wheezingfor 1 day.Patient has been recently hospitalized in March 2022 with COPD exacerbation and pneumonia. Discharged on prednisone taper and currently taking prednisone 10 mg twice daily.  Patient is on poor health, he continues to drink alcoholdaily. In the emergency room, temperature 102. Chest x-ray consistent with right lower lobe pneumonia. Admitted byhospitalist, started on abx. Initially needed bipap for respiratory support but improved. Had worsening  DT's on 4/18 requiring ICU tx and precedex gtt.transferred back under hospitalist service on 04/23/2020.  Completed 5 days of cefepime today.  Has been weaned down to his baseline home oxygen which is between 2 and 4 L.  Was evaluated by PT OT who strongly recommended 24-hour supervision or SNF if no supervision.  Patient lives alone.  Patient was strongly recommended to go to SNF however he declined that.  He wants to go home and is agreeable with home health which has been ordered for him.  He has not required any as needed benzodiazepine for his alcohol withdrawal for last 2 days.  He is being discharged in stable condition.  He has been counseled to stay away from alcohol.  Discharge Diagnoses:  Principal Problem:   Sepsis ([place]) Active Problems:   Hypertension   History of alcohol use   Chronic respiratory failure with hypoxia ([facility])   Type 2 diabetes mellitus without complication, without long-term current use of insulin ([facility])   COPD with acute exacerbation ([facility])   Lactic acid acidosis   HCAP (healthcare-associated pneumonia)   Obesity (BMI 30.0-34.9)   Alcohol withdrawal syndrome, with delirium ([place])   06/10/2020 This is a follow-up face-to-face visit.  The last visit was a phone visit May 23.  Prior to that the patient had been admitted for pneumonia and respiratory failure.  See previous documentation for discharge summary  Patient states he is no longer drinking alcohol at this time his breathing is somewhat better but at baseline.  He is down to 20 mg a day of prednisone cough productive of mucous that is clear to white in nature.  He denies any chest pain.  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  He has been advised to cease doing any significant manual labor.  His medical condition but he persists in this work  Patient denies any chest pain at this time.  He is off all antibiotics.  The patient's not wearing oxygen at this time even though he  is supposed to be on 2 L at night.  On arrival blood pressures 126/76 and saturations 96% room air  Shortness of Breath This is a chronic problem. The current episode started more than 1 year ago. The problem occurs daily (qhs shortness of breath). The problem has been unchanged. Associated symptoms include wheezing. Pertinent negatives include no chest pain, claudication, fever, headaches, hemoptysis, leg pain, leg swelling, orthopnea, PND, rash, sore throat, sputum production, syncope or vomiting. The symptoms are aggravated by any activity, lying flat and occupational exposure. Associated  symptoms comments: gerd  . Risk factors include smoking. He has tried beta agonist inhalers, oral steroids and steroid inhalers for the symptoms. The treatment provided moderate relief. His past medical history is significant for COPD and pneumonia. There is no history of asthma, CAD, DVT, a heart failure or PE.    Past Medical History:  Diagnosis Date  . Acute on chronic respiratory failure with hypoxia ([place]) 06/08/2013  . CAP (community acquired pneumonia) 05/17/2018   See admit 05/17/18 ? rml  with   covid pcr neg - rx augmentin > f/u cxr in 4-6 weeks is fine unless condition declines   . Community acquired pneumonia of right lower lobe of lung 05/17/2018   See admit 05/17/18 ? rml  with   covid pcr neg - rx augmentin > f/u cxr in 4-6 weeks is fine unless condition declines   . COPD (chronic obstructive pulmonary disease) ([place])    not on home  . Diabetes ([place])   . Dyspnea   . Hypertension   . Pneumonia 04/06/2016  . Pneumonia due to COVID-19 virus 10/19/2019  . Pneumothorax    2016, fell from ladder  . 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 Tick bite 06/03/2018     Family History  Problem Relation Age of Onset  . Heart disease Father   . COPD Sister      Social History   Socioeconomic History  . Marital status: Divorced    Spouse name: Not on file  . Number of children: Not on file  . Years of  education: Not on file  . Highest education level: Not on file  Occupational History  . Occupation: Dealer  . Occupation: Painter  Tobacco Use  . Smoking status: Former Smoker    Packs/day: 1.50    Years: 40.00    Pack years: 60.00    Types: Cigarettes    Quit date: 2014    Years since quitting: 8.4  . Smokeless tobacco: Former Systems developer    Types: Secondary school teacher  . Vaping Use: Never used  Substance and Sexual Activity  . Alcohol use: Yes    Alcohol/week: 28.0 standard drinks    Types: 28 Cans of beer per week    Comment: 4 beers a night; + jitters with not drinking, denies DTs or seizures  . Drug use: Yes    Types: Marijuana    Comment: daily; quit using crack and methamphetamine about 5 months ago   . Sexual activity: Not Currently    Partners: Female  Other Topics Concern  . Not on file  Social History Narrative   Lives alone near [place] Determinants of Health   Financial Resource Strain: Not on file  Food Insecurity: Not on file  Transportation Needs: Not on file  Physical Activity: Not on file  Stress: Not on file  Social Connections: Not on file  Intimate Partner Violence: Not on file     Allergies  Allergen Reactions  . Gabapentin Other (See Comments)    hallucinations     Outpatient Medications Prior to Visit  Medication Sig Dispense Refill  . Accu-Chek Softclix Lancets lancets Use as instructed to check blood sugar once daily. 100 each 2  . albuterol (VENTOLIN HFA) 108 (90 Base) MCG/ACT inhaler Inhale 2 puffs into the lungs every 4 (four) hours as needed for wheezing or shortness of breath. 1 each 0  . Blood Glucose Monitoring Suppl (ACCU-CHEK GUIDE ME) w/Device KIT 1 each by Other route in the  morning and at bedtime.    [name] glucose blood (TRUE METRIX BLOOD GLUCOSE TEST) test strip Use as instructed 100 each 12  . guaiFENesin (MUCINEX) 600 MG 12 hr tablet TAKE 1 TABLET (600 MG TOTAL) BY MOUTH 2 (TWO) TIMES DAILY. 30 tablet 0  . losartan  (COZAAR) 100 MG tablet Take 1 tablet (100 mg total) by mouth daily. 60 tablet 4  . Multiple Vitamin (MULTIVITAMIN WITH MINERALS) TABS tablet Take 1 tablet by mouth daily.    [name] Respiratory Therapy Supplies (FLUTTER) DEVI Use 4 times daily (Patient taking differently: 1 each by Other route See admin instructions. Use 4 times daily) 1 each 0  . albuterol (PROVENTIL) (2.5 MG/3ML) 0.083% nebulizer solution Take 3 mLs (2.5 mg total) by nebulization every 6 (six) hours as needed for wheezing or shortness of breath.    . budesonide-formoterol (SYMBICORT) 160-4.5 MCG/ACT inhaler INHALE 2 PUFFS INTO THE LUNGS 2 (TWO) TIMES DAILY. 10.2 g 12  . furosemide (LASIX) 20 MG tablet TAKE 1 TABLET (20 MG TOTAL) BY MOUTH DAILY AS NEEDED FOR EDEMA (LOWER EXTREMITY). (Patient taking differently: Take 20 mg by mouth daily as needed for edema.) 30 tablet 0  . metFORMIN (GLUCOPHAGE) 1000 MG tablet Take 1 tablet (1,000 mg total) by mouth 2 (two) times daily with a meal. 60 tablet 1  . metoprolol succinate (TOPROL-XL) 25 MG 24 hr tablet TAKE 1 TABLET (25 MG TOTAL) BY MOUTH DAILY. (Patient taking differently: Take 25 mg by mouth daily.) 90 tablet 3  . pantoprazole (PROTONIX) 40 MG tablet Take 1 tablet (40 mg total) by mouth daily. 30 tablet 6  . predniSONE (DELTASONE) 10 MG tablet Take 4 tablets daily for 5 days then 2 tablets a day and stay (Patient taking differently: Take 20 mg by mouth daily with breakfast.) 60 tablet 1  . Tiotropium Bromide Monohydrate 2.5 MCG/ACT AERS INHALE 2 PUFFS INTO THE LUNGS DAILY. 4 g 6  . ketoconazole (NIZORAL) 2 % cream 1 (ONE) APPLICATION TO AFFECTED AREA TWICE A DAY (Patient not taking: Reported on 06/10/2020) 60 g 1   No facility-administered medications prior to visit.     Review of Systems  Constitutional: Negative for fever.  HENT: Negative for sinus pressure, sinus pain, sore throat and trouble swallowing.   Eyes: Negative.   Respiratory: Positive for cough, shortness of breath and  wheezing. Negative for hemoptysis, sputum production and chest tightness.   Cardiovascular: Negative for chest pain, palpitations, orthopnea, claudication, leg swelling, syncope and PND.  Gastrointestinal: Negative for nausea and vomiting.  Genitourinary: Negative.   Musculoskeletal: Positive for back pain.  Skin: Negative for rash.  Neurological: Negative for headaches.  Psychiatric/Behavioral: Negative for hallucinations. The patient is not nervous/anxious and is not hyperactive.        Objective:   Physical Exam Vitals:   06/10/20 1043  BP: 126/76  Pulse: 97  SpO2: 96%  Weight: 204 lb 6.4 oz (92.7 kg)  Height: $Remove'5\' 8"'[id number]$  (1.727 m)    Gen: Pleasant, well-nourished, in no distress,  normal affect  ENT: No lesions,  mouth clear,  oropharynx clear, no postnasal drip  Neck: No JVD, no TMG, no carotid bruits  Lungs: No use of accessory muscles, no dullness to percussion, few expiratory wheezes distant breath sounds  Cardiovascular: RRR, heart sounds normal, no murmur or gallops, no peripheral edema  Abdomen: soft and NT, no HSM,  BS normal  Musculoskeletal: No deformities, no cyanosis or clubbing  Neuro: alert, non focal  Skin: Warm, no lesions  or rashes     Disability eval at [facility]:   Pulmonary Function Test (04/19/2019 1:42 PM EDT) Pulmonary Function Test (04/19/2019 1:42 PM EDT)  Component Value Ref Range Performed At Pathologist Signature  FVC Post 2.51 L [facility]   FEV1 Post 1.55 L [facility]   FEV1/FVC Post 61.87 % [facility]   FEF25-75% Post 0.90 L/s [facility]   PEF Post 3.42 L/s [facility]   MVV Post 42.00 L/min [facility]   FVC Pre 2.92 L [facility]   FEV1 Pre 1.57 L [facility]   FEV1/FVC Pre 54.01 % [facility]   FEF25-75% Pre 0.68 L/s [facility]   PEF Pre 4.14 L/s  [facility]   MVV Pre 45.00 L/min [facility]   FVC_LLN 3.73  [facility]   FVC_Z-SCORE -2.84  [facility]   FVC_%PRED 60 % % [facility]   FVC_Z-SCORE -2.84  [facility]   FVC_%PRED 52 % % [facility]   FVC %Chng -14 % % [facility]   FEV1_LLN 2.86  [facility]   FEV1_Z-SCORE -3.78  [facility]   FEV1_%PRED 42 % % [facility]   FEV1_Z-SCORE -3.78  [facility]   FEV1_%PRED 41 % % [facility]   FEV1 %Chng -2 % % [facility]   FEV1/FVC_LLN 66  [facility]   FEV1/FVC %Chng 15 % % [facility]   FEF25-75%_LLN 1.60  [facility]   FEF25-75%_Z-SCORE -3.05  [facility]   FEF25-75%_%PRED 21 % % [facility]   FEF25-75%_Z-SCORE -3.05  [facility]   FEF25-75%_%PRED 28 % % [facility]   FEF25-75% %Chng 33 % % [facility]   PEF_LLN 7.25  [facility]   PEF_%PRED 43 % 36 % % [facility]   PEF %Chng -17 % % [facility]   MVV_LLN 130  [facility]   MVV_%PRED 35 % 32 % % [facility]   MVV %Chng -7 % % [facility]     Media Information    Document Information  Photos    11/02/2018 14:43  Attached To:  [name]  Source Information  [name], MD  [facility]   Pertinent Labs, Studies, and Procedures:  DG Chest Portable 1 View  Result Date: 09/22/2019 CLINICAL DATA:  Dyspnea. COPD exacerbation. Chest pain and shortness of breath. EXAM: PORTABLE CHEST 1 VIEW COMPARISON:  Cyst 07/05/2019 FINDINGS: Stable cardiomediastinal contours. Chronic blunting of the left  costophrenic angle due to pleuroparenchymal scarring. Changes of emphysema noted with asymmetric increase hazy opacity within the right lung base concerning for pneumonia. Numerous remote bilateral rib fracture deformities. IMPRESSION: 1. Increased hazy opacity within the right lung base concerning for pneumonia. 2. Emphysema. Electronically Signed   By: Kerby Moors M.D.   On: 09/22/2019 04:20     BMP Latest Ref Rng & Units 04/24/2020 04/23/2020 04/21/2020  Glucose 70 - 99 mg/dL 121(H) 113(H) 157(H)  BUN 6 - 20 mg/dL 26(H) 31(H) 23(H)  Creatinine 0.61 - 1.24 mg/dL 0.69 0.73 0.87  BUN/Creat  Ratio 9 - 20 - - -  Sodium 135 - 145 mmol/L 139 141 140  Potassium 3.5 - 5.1 mmol/L 4.5 4.2 4.9  Chloride 98 - 111 mmol/L 103 106 105  CO2 22 - 32 mmol/L $RemoveB'30 29 28  '[id number]$ Calcium 8.9 - 10.3 mg/dL 9.0 8.9 9.3   Hepatic Function Panel     Component Value Date/Time   PROT 6.5 04/19/2020 0705   PROT 5.9 (L)

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
## 2020-06-10 NOTE — Assessment & Plan Note (Signed)
No additional alcohol withdrawal symptoms seen

## 2020-06-11 LAB — CBC WITH DIFFERENTIAL/PLATELET
Basophils Absolute: 0 10*3/uL (ref 0.0–0.2)
Basos: 0 %
EOS (ABSOLUTE): 0 10*3/uL (ref 0.0–0.4)
Eos: 0 %
Hematocrit: 37.4 % — ABNORMAL LOW (ref 37.5–51.0)
Hemoglobin: 12.3 g/dL — ABNORMAL LOW (ref 13.0–17.7)
Immature Grans (Abs): 0.1 10*3/uL (ref 0.0–0.1)
Immature Granulocytes: 1 %
Lymphocytes Absolute: 0.7 10*3/uL (ref 0.7–3.1)
Lymphs: 5 %
MCH: 29.3 pg (ref 26.6–33.0)
MCHC: 32.9 g/dL (ref 31.5–35.7)
MCV: 89 fL (ref 79–97)
Monocytes Absolute: 0.5 10*3/uL (ref 0.1–0.9)
Monocytes: 3 %
Neutrophils Absolute: 13 10*3/uL — ABNORMAL HIGH (ref 1.4–7.0)
Neutrophils: 91 %
Platelets: 287 10*3/uL (ref 150–450)
RBC: 4.2 x10E6/uL (ref 4.14–5.80)
RDW: 13.8 % (ref 11.6–15.4)
WBC: 14.3 10*3/uL — ABNORMAL HIGH (ref 3.4–10.8)

## 2020-06-11 LAB — COMPREHENSIVE METABOLIC PANEL
ALT: 22 IU/L (ref 0–44)
AST: 15 IU/L (ref 0–40)
Albumin/Globulin Ratio: 3.1 — ABNORMAL HIGH (ref 1.2–2.2)
Albumin: 4.4 g/dL (ref 3.8–4.9)
Alkaline Phosphatase: 37 IU/L — ABNORMAL LOW (ref 44–121)
BUN/Creatinine Ratio: 18 (ref 9–20)
BUN: 18 mg/dL (ref 6–24)
Bilirubin Total: 0.3 mg/dL (ref 0.0–1.2)
CO2: 25 mmol/L (ref 20–29)
Calcium: 9.5 mg/dL (ref 8.7–10.2)
Chloride: 101 mmol/L (ref 96–106)
Creatinine, Ser: 0.99 mg/dL (ref 0.76–1.27)
Globulin, Total: 1.4 g/dL — ABNORMAL LOW (ref 1.5–4.5)
Glucose: 177 mg/dL — ABNORMAL HIGH (ref 65–99)
Potassium: 4.2 mmol/L (ref 3.5–5.2)
Sodium: 141 mmol/L (ref 134–144)
Total Protein: 5.8 g/dL — ABNORMAL LOW (ref 6.0–8.5)
eGFR: 88 mL/min/{1.73_m2} (ref >59)

## 2020-06-12 ENCOUNTER — Other Ambulatory Visit: Payer: Self-pay

## 2020-06-12 ENCOUNTER — Other Ambulatory Visit: Payer: Medicaid Other | Admitting: Hospice

## 2020-06-12 ENCOUNTER — Telehealth: Payer: Self-pay | Admitting: Hospice

## 2020-06-12 ENCOUNTER — Telehealth: Payer: Self-pay

## 2020-06-12 DIAGNOSIS — J441 Chronic obstructive pulmonary disease with (acute) exacerbation: Secondary | ICD-10-CM | POA: Diagnosis not present

## 2020-06-12 DIAGNOSIS — Z515 Encounter for palliative care: Secondary | ICD-10-CM

## 2020-06-12 NOTE — Telephone Encounter (Signed)
Patient name and DOB has been verified Patient was informed of lab results. Patient had no questions.  

## 2020-06-12 NOTE — Telephone Encounter (Signed)
-----   Message from [name], MD sent at 06/11/2020  9:25 AM EDT ----- Let pt know labs are all ok   no changes

## 2020-06-12 NOTE — Progress Notes (Signed)
[facility] Consult Note Telephone: [phone]  Fax: [phone]  PATIENT NAME: James [name] DOB: 11-17-1960 MRN: [id number]  PRIMARY CARE PROVIDER:   [name], MD [name], MD [address],  [place] [address]  REFERRING PROVIDER: [name], MD [name], MD [address],  [place] [address]  RESPONSIBLE PARTY:  [place]     Name Relation Home Work [place] Sister   [phone]   [name] [phone]     [place], [place] Sister   [phone]      TELEHEALTH VISIT STATEMENT Due to the COVID-19 crisis, this visit was done via telemedicine and it was initiated and consent by this patient and or family. Video-audio (telehealth) contact was unable to be done due to technical barriers from the patient's side.  Visit is to build trust and highlight Palliative Medicine as specialized medical care for people living with serious illness, aimed at facilitating better quality of life through symptoms relief, assisting with advance care planning and complex medical decision making.   RECOMMENDATIONS/PLAN:   Advance Care Planning/Code Status: Patient is a DO NOT RESUSCITATE  Goals of Care: Goals of care include to maximize quality of life and symptom management.  Visit consisted of counseling and education dealing with the complex and emotionally intense issues of symptom management and palliative care in the setting of serious and potentially life-threatening illness. Palliative care team will continue to support patient, patient's family, and medical team.  Symptom management/Plan:  COPD: Patient reports he is compliant with his breathing treatments, occasional shortness of breath with exertion; no COPD exacerbation at this time.  Discussion on tobacco chewing cessation; he said he will cut back for a start.   Continue slow deep breathing, pursed lip breathing, adhering to breathing treatments- Albuterol, Dulera and Spiriva, and Prednisone tabs. Cool environment, use fan for air circulation. Avoid irritants and triggers.  Pulmonologist consult as planned. Type 2 diabetes mellitus: He continues with metformin with no report of adverse effects.  Last A1c 6.7 03/26/2020. Hypertension continues to be managed with metoprolol and losartan.  Patient denies pain/discomfort; in no acute distress.  He also reports he has stopped drinking alcohol.  Validation provided; encouraged to keep it that way. Follow up: Palliative care will continue to follow for complex medical decision making, advance care planning, and clarification of goals. Return 6 weeks or prn. Encouraged to call provider sooner with any concerns.  CHIEF COMPLAINT: Palliative follow up visit/COPD  HISTORY OF PRESENT ILLNESS:  James [name] a 60 y.o. male with multiple medical problems including COPD; no exacerbation at this time.  He reports compliance with breathing treatments and use of oxygen.  No hospitalization since last visit.  History of hypertension, type 2 diabetes mellitus,Tobacco use, alcohol use. History obtained from review of EMR, discussion with primary team, family and/or patient. Records reviewed and summarized above. All 10 point systems reviewed and are negative except as documented in history of present illness above  Review and summarization of Epic records shows history from other than patient.   Palliative Care was asked to follow this patient o help address complex decision making in the context of advance care planning and goals of care clarification.   PPS: 60%    PERTINENT MEDICATIONS:  Outpatient Encounter Medications as of 06/12/2020  Medication Sig   Accu-Chek Softclix Lancets lancets Use as instructed  to check blood sugar once daily.   albuterol (PROVENTIL) (2.5 MG/3ML) 0.083% nebulizer solution Take 3 mLs (2.5 mg  total) by nebulization every 6 (six) hours as needed for wheezing or shortness of breath.   albuterol (VENTOLIN HFA) 108 (90 Base) MCG/ACT inhaler Inhale 2 puffs into the lungs every 4 (four) hours as needed for wheezing or shortness of breath.   Blood Glucose Monitoring Suppl (ACCU-CHEK GUIDE ME) w/Device KIT 1 each by Other route in the morning and at bedtime.   budesonide-formoterol (SYMBICORT) 160-4.5 MCG/ACT inhaler INHALE 2 PUFFS INTO THE LUNGS 2 (TWO) TIMES DAILY.   furosemide (LASIX) 20 MG tablet Take 1 tablet (20 mg total) by mouth daily as needed for edema.   glucose blood (TRUE METRIX BLOOD GLUCOSE TEST) test strip Use as instructed   guaiFENesin (MUCINEX) 600 MG 12 hr tablet TAKE 1 TABLET (600 MG TOTAL) BY MOUTH 2 (TWO) TIMES DAILY.   losartan (COZAAR) 100 MG tablet Take 1 tablet (100 mg total) by mouth daily.   metFORMIN (GLUCOPHAGE) 1000 MG tablet Take 1 tablet (1,000 mg total) by mouth 2 (two) times daily with a meal.   metoprolol succinate (TOPROL-XL) 25 MG 24 hr tablet Take 1 tablet (25 mg total) by mouth daily.   Multiple Vitamin (MULTIVITAMIN WITH MINERALS) TABS tablet Take 1 tablet by mouth daily.   pantoprazole (PROTONIX) 40 MG tablet Take 1 tablet (40 mg total) by mouth daily.   predniSONE (DELTASONE) 10 MG tablet Take 2 tablets (20 mg total) by mouth daily with breakfast.   Respiratory Therapy Supplies (FLUTTER) DEVI Use 4 times daily (Patient taking differently: 1 each by Other route See admin instructions. Use 4 times daily)   Tiotropium Bromide Monohydrate 2.5 MCG/ACT AERS INHALE 2 PUFFS INTO THE LUNGS DAILY.   No facility-administered encounter medications on file as of 06/12/2020.    HOSPICE ELIGIBILITY/DIAGNOSIS: TBD  PAST MEDICAL HISTORY:  Past Medical History:  Diagnosis Date   Acute on chronic respiratory failure with hypoxia ([place]) 06/08/2013   CAP (community acquired pneumonia) 05/17/2018   See admit 05/17/18 ? rml  with   covid pcr neg - rx augmentin > f/u cxr in  4-6 weeks is fine unless condition declines    Community acquired pneumonia of right lower lobe of lung 05/17/2018   See admit 05/17/18 ? 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  ALLERGIES:  Allergies  Allergen Reactions   Gabapentin Other (See Comments)    hallucinations      I spent 40 minutes providing this consultation; this includes time spent with patient/family, chart review and documentation. More than 50% of the time in  this consultation was spent on counseling and coordinating communication   Thank you for the opportunity to participate in the care of BRODI KARI Please call our office at [phone] if we can be of additional assistance.  Note: Portions of this note were generated with [employer]. Dictation errors may occur despite best attempts at proofreading.  Teodoro Spray, NP

## 2020-06-12 NOTE — Telephone Encounter (Signed)
NP called patient for scheduled visit; could not leave voicemail because it was full. NP called [name] and gave her the message for patient to call back.

## 2020-06-16 ENCOUNTER — Telehealth: Payer: Self-pay

## 2020-06-16 NOTE — Telephone Encounter (Signed)
I am out on vacation. Dr Margarita Rana is covering me

## 2020-06-16 NOTE — Telephone Encounter (Signed)
Call placed to patient to inquire if he received the cord for his POC.  He said that he did and [employer] is also going to bring out a room concentrator for him.  He then explained that he has heartburn "really bad."  He has been having a hard time swallowing because it burns and has been taking TUMS with no relief. He said he has not been to Urgent Care. Informed him that [name] would be notified of his concern and reviewed when to seek immediate medical help.

## 2020-06-17 NOTE — Telephone Encounter (Signed)
Call placed to patient and informed him that a prescription for pantoprazole was sent to the pharmacy 06/10/2020. He could not confirm if he already picked it up at [facility] and the pharmacy is now closed. He will check contact [facility] pharmacy tomorrow to see if it is ready for pick up.   He said his heartburn continues to be " real bad" but he does not feel that he needs to go to ED.

## 2020-06-17 NOTE — Telephone Encounter (Signed)
A prescription for Pantoprazole was sent to his pharmacy by [name] on 06/10/2020.

## 2020-06-18 ENCOUNTER — Telehealth: Payer: Self-pay

## 2020-06-18 ENCOUNTER — Other Ambulatory Visit: Payer: Self-pay

## 2020-06-18 NOTE — Telephone Encounter (Signed)
As per [place], the patient picked up the pantoprazole on 06/10/2020,    Call placed to patient and informed his of above. He will check with his sister to make sure that he has it. He said that his breathing is not good today due to the weather. He has his POC but has not heard any more about the room concentrator. Reminded him to seek medical attention if ED if experiences an increased difficulty breathing.   Call placed to [facility], spoke to [facility] regarding the room concentrator.  She said she will have to have CMN sent for MD signature.   This CM sent message to [facility] requesting assistance with processing the order for the room concentrator.

## 2020-06-20 ENCOUNTER — Other Ambulatory Visit: Payer: Self-pay

## 2020-06-20 ENCOUNTER — Other Ambulatory Visit: Payer: Self-pay | Admitting: *Deleted

## 2020-06-20 NOTE — Patient Outreach (Signed)
Medicaid Managed Care   Nurse Care Manager Note  06/20/2020 Name:  James [name] MRN:  [id number] DOB:  1960-02-23  James [name] is an 60 y.o. year old male who is a primary patient of [name], MD.  The [facility] Managed Care Coordination team was consulted for assistance with:    COPD Alcohol use  James [name] was given information about Medicaid Managed Care Coordination team services today. James [name] agreed to services and verbal consent obtained.  Engaged with patient by telephone for follow up visit in response to provider referral for case management and/or care coordination services.   Assessments/Interventions:  Review of past medical history, allergies, medications, health status, including review of consultants reports, laboratory and other test data, was performed as part of comprehensive evaluation and provision of chronic care management services.  SDOH (Social Determinants of Health) assessments and interventions performed:   Care Plan  Allergies  Allergen Reactions   Gabapentin Other (See Comments)    hallucinations    Medications Reviewed Today     Reviewed by [name], MD (Physician) on 06/10/20 at 1051  Med List Status: <None>   Medication Order Taking? Sig Documenting Provider Last Dose Status Informant  Accu-Chek Softclix Lancets lancets [id number] Yes Use as instructed to check blood sugar once daily. [name], MD Taking Active Self  albuterol (PROVENTIL) (2.5 MG/3ML) 0.083% nebulizer solution [id number] Yes Take 3 mLs (2.5 mg total) by nebulization every 6 (six) hours as needed for wheezing or shortness of breath. [name], MD Taking Active Self  albuterol (VENTOLIN HFA) 108 (90 Base) MCG/ACT inhaler [id number] Yes Inhale 2 puffs into the lungs every 4 (four) hours as needed for wheezing or shortness of breath. Ward, Delice Bison, DO Taking Active Self  Blood Glucose Monitoring Suppl (ACCU-CHEK GUIDE ME) w/Device KIT [id number]  Yes 1 each by Other route in the morning and at bedtime. [provider] Taking Active Self  budesonide-formoterol (SYMBICORT) 160-4.5 MCG/ACT inhaler [id number] Yes INHALE 2 PUFFS INTO THE LUNGS 2 (TWO) TIMES DAILY. [name], MD Taking Active Self  furosemide (LASIX) 20 MG tablet [id number] Yes TAKE 1 TABLET (20 MG TOTAL) BY MOUTH DAILY AS NEEDED FOR EDEMA (LOWER EXTREMITY).  Patient taking differently: Take 20 mg by mouth daily as needed for edema.   [name], MD Taking Active Self  glucose blood (TRUE METRIX BLOOD GLUCOSE TEST) test strip [id number] Yes Use as instructed [name], MD Taking Active Self  guaiFENesin (MUCINEX) 600 MG 12 hr tablet [id number] Yes TAKE 1 TABLET (600 MG TOTAL) BY MOUTH 2 (TWO) TIMES DAILY. [name], MD Taking Active Self  ketoconazole (NIZORAL) 2 % cream [id number] Yes 1 (ONE) APPLICATION TO AFFECTED AREA TWICE A DAY [name], MD Taking Active   losartan (COZAAR) 100 MG tablet [id number] Yes Take 1 tablet (100 mg total) by mouth daily. [name], MD Taking Active   metFORMIN (GLUCOPHAGE) 1000 MG tablet [id number] Yes Take 1 tablet (1,000 mg total) by mouth 2 (two) times daily with a meal. [name], MD Taking Active Self  metoprolol succinate (TOPROL-XL) 25 MG 24 hr tablet [id number] Yes TAKE 1 TABLET (25 MG TOTAL) BY MOUTH DAILY.  Patient taking differently: Take 25 mg by mouth daily.   [name], MD Taking Active Self  Multiple Vitamin (MULTIVITAMIN WITH MINERALS) TABS tablet [id number] Yes Take 1 tablet by mouth daily. [name], MD Taking Active   pantoprazole (Will) 40  MG tablet [id number] Yes Take 1 tablet (40 mg total) by mouth daily. [name], MD Taking Active Self  predniSONE (DELTASONE) 10 MG tablet [id number] Yes Take 4 tablets daily for 5 days then 2 tablets a day and stay [name], MD Taking Active   Respiratory Therapy Supplies (FLUTTER) DEVI [id number] Yes Use 4 times  daily  Patient taking differently: 1 each by Other route See admin instructions. Use 4 times daily   [name], MD Taking Active Family Member  Tiotropium Bromide Monohydrate 2.5 MCG/ACT AERS [id number] Yes INHALE 2 PUFFS INTO THE LUNGS DAILY. [name], MD Taking Active Self            Patient Active Problem List   Diagnosis Date Noted   Palliative care encounter 05/26/2020   Alcohol withdrawal syndrome, with delirium ([place])    Obesity (BMI 30.0-34.9) 04/19/2020   Pain of right forearm 12/03/2019   Bronchiectasis ([place]) 09/24/2019   Type 2 diabetes mellitus without complication, without long-term current use of insulin ([place]) 07/24/2019   Other atopic dermatitis 05/01/2019   Chronic respiratory failure with hypoxia ([place]) 04/23/2019   Intellectual disability 04/23/2019   History of alcohol use 11/23/2018   Hypertension 05/17/2016   History of tobacco abuse 04/06/2016   COPD mixed type ([place]) 03/26/2016    Conditions to be addressed/monitored per PCP order:  COPD and alcohol use  Care Plan : COPD (Adult)  Updates made by [name], RN since 06/20/2020 12:00 AM     Problem: Symptom Exacerbation (COPD)      Long-Range Goal: Symptom Exacerbation Prevented or Minimized   Start Date: 04/09/2020  Expected End Date: 07/18/2020  Recent Progress: On track  Priority: High  Note:   Current Barriers:  Knowledge deficits related to basic COPD self care/management-James [name] was recently admitted for COPD exacerbation and pnuemonia. He reports taking his medications, wearing oxygen continuously, and has a follow up with his PCP on 04/14/20. He does report having difficulty at times swallowing food and liquid, pain in back and legs, and difficulty falling asleep. He is planning to discuss these issues with his PCP. James [name] reports having transportation and plenty of food. He lives alone, and has support from his sister. James [name] attended his follow up with PCP. He was  working outside at his home today and reports doing "about the same". He reports increased difficulty breathing when it is hot outside. He has his medications and oxygen, reports no needs at this time. He is working with [facility] pharmacist for medication management.Update-James [name] reports his breathing is about the same, worse with the warm temperatures. He is aware of his PCP appointment on 7/28 and has transportation. James [name] reports difficulty swallowing at times due to throat pain. Reports needing to pick up prescription for protonix, plans to pick it up on Monday. He is not drinking alcohol and reports feeling better since quitting Does not contact provider office for questions/concerns  Case Manager Clinical Goal(s): patient will report using inhalers as prescribed including rinsing mouth after use patient will engage in lite exercise as tolerated to build/regain stamina and strength and reduce shortness of breath through activity tolerance patient will verbalize basic understanding of COPD disease process and self care activities patient will not be hospitalized for COPD exacerbation as evidenced  Patient will work with [facility] pharmacist for medication management-Met Patient will attend all scheduled appointments Interventions:  Inter-disciplinary care team collaboration (see longitudinal plan of care)  Encouraged to  avoid being outside during the middle of the day when it is hot outside Reviewed upcoming scheduled appointments Provided RNCM contact information for any healthcare related barriers Discussed foods to avoid/foods that trigger acid reflux Congratulated patient on his sobriety Patient Goals/Self-Care Activities:  - avoid outdoors during the warmest part of the day - avoid second hand smoke - identify and remove indoor air pollutants - limit outdoor activity during cold weather - listen for public air quality announcements every day  - take prescribed medications as directed -  call PCP with new concerns or questions - develop a new routine to improve sleep - get at least 7 to 8 hours of sleep at night - keep room cool and dark - practice relaxation or meditation daily  - call PCP with new concerns or questions - take all medications as directed Follow Up Plan: Telephone follow up appointment with care management team member scheduled for:07/18/20 @ 3:30pm     Care Plan : Wellness (Adult)  Updates made by [name], RN since 06/20/2020 12:00 AM     Problem: (RNCM) Alcohol Use (Wellness)      Long-Range Goal: Alcohol Use Managed   Start Date: 04/25/2020  Expected End Date: 08/25/2020  This Visit's Progress: On track  Recent Progress: On track  Priority: High  Note:   Current Barriers:  Ineffective Self Health Maintenance-James [name] was admitted on 4/16-4/21. Patient reports it was because of the alcohol. He wants to quit and feels that he can stop drinking on his own. Today he reports having all of his medications, plenty of food and denies any needs at this time. He was discharged with [facility], he is waiting to hear from the agency. Update-James [name] reports no alcohol and no desire to drink.  Does not adhere to provider recommendations re: quit drinking alcohol Currently UNABLE TO independently self manage needs related to chronic health conditions.  Knowledge Deficits related to short term plan for care coordination needs and long term plans for chronic disease management needs Nurse Case Manager Clinical Goal(s):  patient will work with care management team to address care coordination and chronic disease management needs related to alcohol use   Interventions:  Evaluation of current treatment plan related to alcohol use and quitting and patient's adherence to plan as established by provider. Advised patient to avoid friends/family/situations that drinking is involved, consider counseling for quitting drinking, reach out to supportive  friends/family Discussed plans with patient for ongoing care management follow up and provided patient with direct contact information for care management team Provided therapeutic listening Self Care Activities:  Patient will self administer medications as prescribed Patient will attend all scheduled provider appointments Patient will call pharmacy for medication refills Patient will call provider office for new concerns or questions Patient Goals: - avoid people and places where alcohol is used - develop a plan to avoid relapse  - think about counseling to assist in [place] - work with Ubaldo Glassing for therapy options Follow Up Plan: Telephone follow up appointment with care management team member scheduled for:07/18/20 @ 3:30pm      Follow Up:  Patient agrees to Care Plan and Follow-up.  Plan: The Managed Medicaid care management team will reach out to the patient again over the next 30 days.  Date/time of next scheduled RN care management/care coordination outreach:  07/18/20 @ 3:30  [name] RN, [place] RN Care Coordinator

## 2020-06-20 NOTE — Patient Instructions (Signed)
Visit Information  James [name] was given information about Medicaid Managed Care team care coordination services as a part of their Healthy [facility] Medicaid benefit. James [name] verbally consented to engagement with the [facility] Managed Care team.   For questions related to your Healthy [facility] health plan, please call: [phone] or visit the homepage here: [email or web address]  If you would like to schedule transportation through your Healthy [facility] plan, please call the following number at least 2 days in advance of your appointment: [phone]   Call the [place] at [phone], at any time, 24 hours a day, 7 days a week. If you are in danger or need immediate medical attention call 911.  James [name] - following are the goals we discussed in your visit today:   Goals Addressed             This Visit's Progress    Manage Fatigue (Tiredness-COPD)       Follow Up Date 07/18/20   - develop a new routine to improve sleep - get at least 7 to 8 hours of sleep at night - keep room cool and dark - practice relaxation or meditation daily    Why is this important?   Feeling tired or worn out is a common symptom of COPD (chronic obstructive pulmonary disease).  Learning when you feel your best and when you need rest is important.  Managing the tiredness (fatigue) will help you be active and enjoy life.           Stop or Cut Down Drinking Alcohol       Timeframe:  Long-Range Goal Priority:  High Start Date:   04/25/20                          Expected End Date:  08/25/20                     Follow Up Date 07/18/20   - avoid people and places where alcohol is used - develop a plan to avoid relapse  - think about counseling to assist in [name] - work with [name]/[name] for therapy options   Why is this important?   To stop drinking it is important to have support from a person or group of people who you can  count on.  You will also need to think about the things that make you feel like using alcohol; then plan for how to handle them.          Track and Manage My Triggers-COPD       Timeframe:  Long-Range Goal Priority:  High Start Date:  04/09/20                           Expected End Date:   07/18/20                    Follow Up Date 07/18/20   - avoid outdoors during the warmest part of the day - avoid second hand smoke - identify and remove indoor air pollutants - limit outdoor activity during cold weather - listen for public air quality announcements every day  - take prescribed medications as directed - call PCP with new concerns or questions - take all medications as directed   Why is this important?   Triggers are activities or things, like tobacco smoke or cold weather, that make your  COPD (chronic obstructive pulmonary disease) flare-up.  Knowing these triggers helps you plan how to stay away from them.  When you cannot remove them, you can learn how to manage them.              Please see education materials related to reflux provided by MyChart link.  Patient verbalizes understanding of instructions provided today.   Telephone follow up appointment with Managed Medicaid care management team member scheduled for:07/18/20 @ 3:30  [name] RN, [place] RN Care Coordinator   Following is a copy of your plan of care:  Patient Care Plan: COPD (Adult)     Problem Identified: Symptom Exacerbation (COPD)      Long-Range Goal: Symptom Exacerbation Prevented or Minimized   Start Date: 04/09/2020  Expected End Date: 07/18/2020  Recent Progress: On track  Priority: High  Note:   Current Barriers:  Knowledge deficits related to basic COPD self care/management-James [name] was recently admitted for COPD exacerbation and pnuemonia. He reports taking his medications, wearing oxygen continuously, and has a follow up with his PCP on  04/14/20. He does report having difficulty at times swallowing food and liquid, pain in back and legs, and difficulty falling asleep. He is planning to discuss these issues with his PCP. James [name] reports having transportation and plenty of food. He lives alone, and has support from his sister. James [name] attended his follow up with PCP. He was working outside at his home today and reports doing "about the same". He reports increased difficulty breathing when it is hot outside. He has his medications and oxygen, reports no needs at this time. He is working with [facility] pharmacist for medication management.Update-James [name] reports his breathing is about the same, worse with the warm temperatures. He is aware of his PCP appointment on 7/28 and has transportation. James [name] reports difficulty swallowing at times due to throat pain. Reports needing to pick up prescription for protonix, plans to pick it up on Monday. He is not drinking alcohol and reports feeling better since quitting Does not contact provider office for questions/concerns  Case Manager Clinical Goal(s): patient will report using inhalers as prescribed including rinsing mouth after use patient will engage in lite exercise as tolerated to build/regain stamina and strength and reduce shortness of breath through activity tolerance patient will verbalize basic understanding of COPD disease process and self care activities patient will not be hospitalized for COPD exacerbation as evidenced  Patient will work with [facility] pharmacist for medication management-[facility] Patient will attend all scheduled appointments Interventions:  Inter-disciplinary care team collaboration (see longitudinal plan of care)  Encouraged to avoid being outside during the middle of the day when it is hot outside Reviewed upcoming scheduled appointments Provided RNCM contact information for any healthcare related barriers Discussed foods to avoid/foods that trigger acid  reflux Congratulated patient on his sobriety Patient Goals/Self-Care Activities:  - avoid outdoors during the warmest part of the day - avoid second hand smoke - identify and remove indoor air pollutants - limit outdoor activity during cold weather - listen for public air quality announcements every day  - take prescribed medications as directed - call PCP with new concerns or questions - develop a new routine to improve sleep - get at least 7 to 8 hours of sleep at night - keep room cool and dark - practice relaxation or meditation daily  - call PCP with new concerns or questions - take all medications as directed  Follow Up Plan: Telephone follow up appointment with care management team member scheduled for:07/18/20 @ 3:30pm     Patient Care Plan: Medication Management     Problem Identified: Health Promotion or Disease Self-Management (General Plan of Care)      Goal: Medication Management   Note:   Current Barriers:  Unable to independently monitor therapeutic efficacy Unable to self administer medications as prescribed Does not adhere to prescribed medication regimen Does not maintain contact with provider office Does not contact provider office for questions/concerns   Pharmacist Clinical Goal(s):  Over the next 30 days, patient will contact provider office for questions/concerns as evidenced notation of same in electronic health record through collaboration with PharmD and provider.    Interventions: Inter-disciplinary care team collaboration (see longitudinal plan of care) Comprehensive medication review performed; medication list updated in electronic medical record  @RXCPDIABETES @ @RXCPHYPERTENSION @ @RXCPHYPERLIPIDEMIA @ Health Maintenance  Patient Goals/Self-Care Activities Over the next 30 days, patient will:  - collaborate with provider on medication access solutions  Follow Up Plan: The care management team will reach out to the patient again over the  next 30 days.       Patient Care Plan: Wellness (Adult)     Problem Identified: (RNCM) Alcohol Use (Wellness)      Long-Range Goal: Alcohol Use Managed   Start Date: 04/25/2020  Expected End Date: 08/25/2020  This Visit's Progress: On track  Recent Progress: On track  Priority: High  Note:   Current Barriers:  Ineffective Self Health Maintenance-Mr. Hasler was admitted on 4/16-4/21. Patient reports it was because of the alcohol. He wants to quit and feels that he can stop drinking on his own. Today he reports having all of his medications, plenty of food and denies any needs at this time. He was discharged with [facility], he is waiting to hear from the agency. Update-Mr. Stoneking reports no alcohol and no desire to drink.  Does not adhere to provider recommendations re: quit drinking alcohol Currently UNABLE TO independently self manage needs related to chronic health conditions.  Knowledge Deficits related to short term plan for care coordination needs and long term plans for chronic disease management needs Nurse Case Manager Clinical Goal(s):  patient will work with care management team to address care coordination and chronic disease management needs related to alcohol use   Interventions:  Evaluation of current treatment plan related to alcohol use and quitting and patient's adherence to plan as established by provider. Advised patient to avoid friends/family/situations that drinking is involved, consider counseling for quitting drinking, reach out to supportive friends/family Discussed plans with patient for ongoing care management follow up and provided patient with direct contact information for care management team Provided therapeutic listening Self Care Activities:  Patient will self administer medications as prescribed Patient will attend all scheduled provider appointments Patient will call pharmacy for medication refills Patient will call provider office for new concerns or  questions Patient Goals: - avoid people and places where alcohol is used - develop a plan to avoid relapse  - think about counseling to assist in [place] - work with Ubaldo Glassing for therapy options Follow Up Plan: Telephone follow up appointment with care management team member scheduled for:07/18/20 @ 3:30pm

## 2020-06-23 ENCOUNTER — Other Ambulatory Visit: Payer: Self-pay

## 2020-06-24 ENCOUNTER — Other Ambulatory Visit: Payer: Self-pay

## 2020-06-24 ENCOUNTER — Telehealth: Payer: Self-pay

## 2020-06-24 NOTE — Telephone Encounter (Signed)
Message sent to [facility] inquiring if patient is eligible for stationary O2 concentrator.   Call placed to [place], spoke to [place] who was not able to confirm why patient does not have a stationary concentrator . She was also not able to determine if patient has continuous flow or pulse dose POC

## 2020-06-26 ENCOUNTER — Emergency Department (HOSPITAL_COMMUNITY): Payer: Medicaid Other

## 2020-06-26 ENCOUNTER — Other Ambulatory Visit: Payer: Self-pay

## 2020-06-26 ENCOUNTER — Encounter (HOSPITAL_COMMUNITY): Payer: Self-pay

## 2020-06-26 ENCOUNTER — Inpatient Hospital Stay (HOSPITAL_COMMUNITY)
Admission: EM | Admit: 2020-06-26 | Discharge: 2020-06-29 | DRG: 193 | Disposition: A | Discharge status: Home / Self Care | Payer: Medicaid Other | Attending: Hospitalist | Admitting: Hospitalist

## 2020-06-26 DIAGNOSIS — E119 Type 2 diabetes mellitus without complications: Secondary | ICD-10-CM | POA: Diagnosis not present

## 2020-06-26 DIAGNOSIS — R0902 Hypoxemia: Secondary | ICD-10-CM | POA: Diagnosis not present

## 2020-06-26 DIAGNOSIS — Z7984 Long term (current) use of oral hypoglycemic drugs: Secondary | ICD-10-CM

## 2020-06-26 DIAGNOSIS — R059 Cough, unspecified: Secondary | ICD-10-CM | POA: Diagnosis not present

## 2020-06-26 DIAGNOSIS — F1021 Alcohol dependence, in remission: Secondary | ICD-10-CM | POA: Diagnosis present

## 2020-06-26 DIAGNOSIS — T380X5A Adverse effect of glucocorticoids and synthetic analogues, initial encounter: Secondary | ICD-10-CM | POA: Diagnosis present

## 2020-06-26 DIAGNOSIS — J9620 Acute and chronic respiratory failure, unspecified whether with hypoxia or hypercapnia: Secondary | ICD-10-CM | POA: Diagnosis present

## 2020-06-26 DIAGNOSIS — Z6830 Body mass index (BMI) 30.0-30.9, adult: Secondary | ICD-10-CM

## 2020-06-26 DIAGNOSIS — J9611 Chronic respiratory failure with hypoxia: Secondary | ICD-10-CM

## 2020-06-26 DIAGNOSIS — J441 Chronic obstructive pulmonary disease with (acute) exacerbation: Secondary | ICD-10-CM | POA: Diagnosis not present

## 2020-06-26 DIAGNOSIS — J189 Pneumonia, unspecified organism: Principal | ICD-10-CM | POA: Diagnosis present

## 2020-06-26 DIAGNOSIS — E669 Obesity, unspecified: Secondary | ICD-10-CM | POA: Diagnosis present

## 2020-06-26 DIAGNOSIS — Z9981 Dependence on supplemental oxygen: Secondary | ICD-10-CM | POA: Diagnosis not present

## 2020-06-26 DIAGNOSIS — F1090 Alcohol use, unspecified, uncomplicated: Secondary | ICD-10-CM

## 2020-06-26 DIAGNOSIS — Z8616 Personal history of COVID-19: Secondary | ICD-10-CM | POA: Diagnosis not present

## 2020-06-26 DIAGNOSIS — E1165 Type 2 diabetes mellitus with hyperglycemia: Secondary | ICD-10-CM | POA: Diagnosis present

## 2020-06-26 DIAGNOSIS — J9621 Acute and chronic respiratory failure with hypoxia: Secondary | ICD-10-CM | POA: Diagnosis present

## 2020-06-26 DIAGNOSIS — J449 Chronic obstructive pulmonary disease, unspecified: Secondary | ICD-10-CM | POA: Diagnosis present

## 2020-06-26 DIAGNOSIS — Z87898 Personal history of other specified conditions: Secondary | ICD-10-CM

## 2020-06-26 DIAGNOSIS — I10 Essential (primary) hypertension: Secondary | ICD-10-CM | POA: Diagnosis not present

## 2020-06-26 DIAGNOSIS — Z7951 Long term (current) use of inhaled steroids: Secondary | ICD-10-CM | POA: Diagnosis not present

## 2020-06-26 DIAGNOSIS — Z7952 Long term (current) use of systemic steroids: Secondary | ICD-10-CM | POA: Diagnosis not present

## 2020-06-26 DIAGNOSIS — Z87891 Personal history of nicotine dependence: Secondary | ICD-10-CM

## 2020-06-26 DIAGNOSIS — R062 Wheezing: Secondary | ICD-10-CM | POA: Diagnosis not present

## 2020-06-26 DIAGNOSIS — R0602 Shortness of breath: Secondary | ICD-10-CM | POA: Diagnosis not present

## 2020-06-26 DIAGNOSIS — F17200 Nicotine dependence, unspecified, uncomplicated: Secondary | ICD-10-CM

## 2020-06-26 DIAGNOSIS — E66811 Obesity, class 1: Secondary | ICD-10-CM | POA: Diagnosis present

## 2020-06-26 DIAGNOSIS — J440 Chronic obstructive pulmonary disease with acute lower respiratory infection: Secondary | ICD-10-CM | POA: Diagnosis present

## 2020-06-26 DIAGNOSIS — Z79899 Other long term (current) drug therapy: Secondary | ICD-10-CM | POA: Diagnosis not present

## 2020-06-26 LAB — COMPREHENSIVE METABOLIC PANEL
ALT: 25 U/L (ref 0–44)
AST: 27 U/L (ref 15–41)
Albumin: 3.8 g/dL (ref 3.5–5.0)
Alkaline Phosphatase: 33 U/L — ABNORMAL LOW (ref 38–126)
Anion gap: 7 (ref 5–15)
BUN: 16 mg/dL (ref 6–20)
CO2: 31 mmol/L (ref 22–32)
Calcium: 9 mg/dL (ref 8.9–10.3)
Chloride: 102 mmol/L (ref 98–111)
Creatinine, Ser: 1.05 mg/dL (ref 0.61–1.24)
GFR, Estimated: >60 mL/min (ref >60)
Glucose, Bld: 110 mg/dL — ABNORMAL HIGH (ref 70–99)
Potassium: 4.3 mmol/L (ref 3.5–5.1)
Sodium: 140 mmol/L (ref 135–145)
Total Bilirubin: 0.4 mg/dL (ref 0.3–1.2)
Total Protein: 5.9 g/dL — ABNORMAL LOW (ref 6.5–8.1)

## 2020-06-26 LAB — CBC WITH DIFFERENTIAL/PLATELET
Abs Immature Granulocytes: 0.05 10*3/uL (ref 0.00–0.07)
Basophils Absolute: 0.1 10*3/uL (ref 0.0–0.1)
Basophils Relative: 1 %
Eosinophils Absolute: 0.1 10*3/uL (ref 0.0–0.5)
Eosinophils Relative: 1 %
HCT: 41.9 % (ref 39.0–52.0)
Hemoglobin: 12.8 g/dL — ABNORMAL LOW (ref 13.0–17.0)
Immature Granulocytes: 0 %
Lymphocytes Relative: 24 %
Lymphs Abs: 3.3 10*3/uL (ref 0.7–4.0)
MCH: 28.9 pg (ref 26.0–34.0)
MCHC: 30.5 g/dL (ref 30.0–36.0)
MCV: 94.6 fL (ref 80.0–100.0)
Monocytes Absolute: 1 10*3/uL (ref 0.1–1.0)
Monocytes Relative: 8 %
Neutro Abs: 8.9 10*3/uL — ABNORMAL HIGH (ref 1.7–7.7)
Neutrophils Relative %: 66 %
Platelets: 300 10*3/uL (ref 150–400)
RBC: 4.43 MIL/uL (ref 4.22–5.81)
RDW: 14.3 % (ref 11.5–15.5)
WBC: 13.4 10*3/uL — ABNORMAL HIGH (ref 4.0–10.5)
nRBC: 0 % (ref 0.0–0.2)

## 2020-06-26 LAB — TROPONIN I (HIGH SENSITIVITY)
Troponin I (High Sensitivity): 15 ng/L (ref <18)
Troponin I (High Sensitivity): 14 ng/L (ref <18)

## 2020-06-26 LAB — GLUCOSE, CAPILLARY
Glucose-Capillary: 229 mg/dL — ABNORMAL HIGH (ref 70–99)
Glucose-Capillary: 243 mg/dL — ABNORMAL HIGH (ref 70–99)
Glucose-Capillary: 157 mg/dL — ABNORMAL HIGH (ref 70–99)

## 2020-06-26 LAB — PROCALCITONIN: Procalcitonin: <0.1 ng/mL

## 2020-06-26 LAB — RESP PANEL BY RT-PCR (FLU A&B, COVID) ARPGX2
Influenza A by PCR: NEGATIVE
Influenza B by PCR: NEGATIVE
SARS Coronavirus 2 by RT PCR: NEGATIVE

## 2020-06-26 LAB — HEMOGLOBIN A1C
Hgb A1c MFr Bld: 6.5 % — ABNORMAL HIGH (ref 4.8–5.6)
Mean Plasma Glucose: 139.85 mg/dL

## 2020-06-26 LAB — BRAIN NATRIURETIC PEPTIDE: B Natriuretic Peptide: 28.2 pg/mL (ref 0.0–100.0)

## 2020-06-26 LAB — HIV ANTIBODY (ROUTINE TESTING W REFLEX): HIV Screen 4th Generation wRfx: NONREACTIVE

## 2020-06-26 MED ORDER — BISACODYL 5 MG PO TBEC: 5.0000 mg | DELAYED_RELEASE_TABLET | Freq: Every day | ORAL | Status: DC | PRN

## 2020-06-26 MED ORDER — LOSARTAN POTASSIUM 50 MG PO TABS
100.0000 mg | ORAL_TABLET | Freq: Every day | ORAL | Status: DC
  Administered 2020-06-26 – 2020-06-29 (×4): 100 mg via ORAL
  Filled 2020-06-26 (×4): qty 2

## 2020-06-26 MED ORDER — INSULIN ASPART 100 UNIT/ML IJ SOLN: 0.0000 [IU] | Freq: Every day | INTRAMUSCULAR | Status: DC

## 2020-06-26 MED ORDER — METHYLPREDNISOLONE SODIUM SUCC 125 MG IJ SOLR
60.0000 mg | Freq: Two times a day (BID) | INTRAMUSCULAR | Status: AC
  Administered 2020-06-26 (×2): 60 mg via INTRAVENOUS
  Filled 2020-06-26 (×2): qty 2

## 2020-06-26 MED ORDER — POLYETHYLENE GLYCOL 3350 17 G PO PACK: 17.0000 g | PACK | Freq: Every day | ORAL | Status: DC | PRN

## 2020-06-26 MED ORDER — GUAIFENESIN ER 600 MG PO TB12: 600.0000 mg | ORAL_TABLET | Freq: Two times a day (BID) | ORAL | Status: DC | PRN

## 2020-06-26 MED ORDER — MORPHINE SULFATE (PF) 2 MG/ML IV SOLN: 2.0000 mg | INTRAVENOUS | Status: DC | PRN

## 2020-06-26 MED ORDER — METHYLPREDNISOLONE SODIUM SUCC 125 MG IJ SOLR
125.0000 mg | Freq: Once | INTRAMUSCULAR | Status: AC
  Administered 2020-06-26: 125 mg via INTRAVENOUS
  Filled 2020-06-26: qty 2

## 2020-06-26 MED ORDER — IPRATROPIUM-ALBUTEROL 0.5-2.5 (3) MG/3ML IN SOLN
3.0000 mL | Freq: Three times a day (TID) | RESPIRATORY_TRACT | Status: DC
  Administered 2020-06-26 – 2020-06-29 (×9): 3 mL via RESPIRATORY_TRACT
  Filled 2020-06-26 (×9): qty 3

## 2020-06-26 MED ORDER — SODIUM CHLORIDE 0.9 % IV SOLN: INTRAVENOUS | Status: DC

## 2020-06-26 MED ORDER — PREDNISONE 20 MG PO TABS
40.0000 mg | ORAL_TABLET | Freq: Every day | ORAL | Status: DC
  Administered 2020-06-27 – 2020-06-29 (×3): 40 mg via ORAL
  Filled 2020-06-26 (×3): qty 2

## 2020-06-26 MED ORDER — IPRATROPIUM-ALBUTEROL 0.5-2.5 (3) MG/3ML IN SOLN
3.0000 mL | Freq: Four times a day (QID) | RESPIRATORY_TRACT | Status: DC
  Administered 2020-06-26: 3 mL via RESPIRATORY_TRACT

## 2020-06-26 MED ORDER — SODIUM CHLORIDE 0.9% FLUSH
3.0000 mL | Freq: Two times a day (BID) | INTRAVENOUS | Status: DC
  Administered 2020-06-26 – 2020-06-29 (×6): 3 mL via INTRAVENOUS

## 2020-06-26 MED ORDER — OXYCODONE HCL 5 MG PO TABS: 5.0000 mg | ORAL_TABLET | ORAL | Status: DC | PRN

## 2020-06-26 MED ORDER — METOPROLOL SUCCINATE ER 25 MG PO TB24
25.0000 mg | ORAL_TABLET | Freq: Every day | ORAL | Status: DC
  Administered 2020-06-26 – 2020-06-29 (×4): 25 mg via ORAL
  Filled 2020-06-26 (×4): qty 1

## 2020-06-26 MED ORDER — INSULIN ASPART 100 UNIT/ML IJ SOLN
0.0000 [IU] | Freq: Three times a day (TID) | INTRAMUSCULAR | Status: DC
  Administered 2020-06-26: 5 [IU] via SUBCUTANEOUS
  Administered 2020-06-27: 3 [IU] via SUBCUTANEOUS
  Administered 2020-06-27: 5 [IU] via SUBCUTANEOUS
  Administered 2020-06-27 – 2020-06-28 (×2): 2 [IU] via SUBCUTANEOUS
  Administered 2020-06-28: 5 [IU] via SUBCUTANEOUS

## 2020-06-26 MED ORDER — NICOTINE 14 MG/24HR TD PT24
14.0000 mg | MEDICATED_PATCH | Freq: Every day | TRANSDERMAL | Status: DC
  Filled 2020-06-26 (×4): qty 1

## 2020-06-26 MED ORDER — ACETAMINOPHEN 325 MG PO TABS: 650.0000 mg | ORAL_TABLET | Freq: Four times a day (QID) | ORAL | Status: DC | PRN

## 2020-06-26 MED ORDER — HYDRALAZINE HCL 20 MG/ML IJ SOLN: 5.0000 mg | INTRAMUSCULAR | Status: DC | PRN

## 2020-06-26 MED ORDER — ONDANSETRON HCL 4 MG/2ML IJ SOLN: 4.0000 mg | Freq: Four times a day (QID) | INTRAMUSCULAR | Status: DC | PRN

## 2020-06-26 MED ORDER — SODIUM CHLORIDE 0.9 % IV SOLN
2.0000 g | INTRAVENOUS | Status: DC
  Administered 2020-06-27 – 2020-06-29 (×3): 2 g via INTRAVENOUS
  Filled 2020-06-26: qty 2
  Filled 2020-06-26: qty 20
  Filled 2020-06-26: qty 2

## 2020-06-26 MED ORDER — IPRATROPIUM-ALBUTEROL 0.5-2.5 (3) MG/3ML IN SOLN
RESPIRATORY_TRACT | Status: AC
  Filled 2020-06-26: qty 3

## 2020-06-26 MED ORDER — IPRATROPIUM-ALBUTEROL 0.5-2.5 (3) MG/3ML IN SOLN
3.0000 mL | RESPIRATORY_TRACT | Status: AC
  Administered 2020-06-26 (×3): 3 mL via RESPIRATORY_TRACT
  Filled 2020-06-26: qty 9

## 2020-06-26 MED ORDER — MOMETASONE FURO-FORMOTEROL FUM 200-5 MCG/ACT IN AERO
2.0000 | INHALATION_SPRAY | Freq: Two times a day (BID) | RESPIRATORY_TRACT | Status: DC
  Administered 2020-06-27 – 2020-06-29 (×5): 2 via RESPIRATORY_TRACT
  Filled 2020-06-26: qty 8.8

## 2020-06-26 MED ORDER — IPRATROPIUM-ALBUTEROL 0.5-2.5 (3) MG/3ML IN SOLN
3.0000 mL | Freq: Once | RESPIRATORY_TRACT | Status: AC
  Administered 2020-06-26: 3 mL via RESPIRATORY_TRACT

## 2020-06-26 MED ORDER — SODIUM CHLORIDE 0.9 % IV SOLN
500.0000 mg | Freq: Once | INTRAVENOUS | Status: AC
  Administered 2020-06-26: 500 mg via INTRAVENOUS
  Filled 2020-06-26: qty 500

## 2020-06-26 MED ORDER — ACETAMINOPHEN 650 MG RE SUPP: 650.0000 mg | Freq: Four times a day (QID) | RECTAL | Status: DC | PRN

## 2020-06-26 MED ORDER — ENOXAPARIN SODIUM 40 MG/0.4ML IJ SOSY
40.0000 mg | PREFILLED_SYRINGE | INTRAMUSCULAR | Status: DC
  Administered 2020-06-26: 40 mg via SUBCUTANEOUS
  Filled 2020-06-26 (×2): qty 0.4

## 2020-06-26 MED ORDER — DOCUSATE SODIUM 100 MG PO CAPS
100.0000 mg | ORAL_CAPSULE | Freq: Two times a day (BID) | ORAL | Status: DC
  Administered 2020-06-26 – 2020-06-29 (×3): 100 mg via ORAL
  Filled 2020-06-26 (×5): qty 1

## 2020-06-26 MED ORDER — ONDANSETRON HCL 4 MG PO TABS: 4.0000 mg | ORAL_TABLET | Freq: Four times a day (QID) | ORAL | Status: DC | PRN

## 2020-06-26 MED ORDER — SODIUM CHLORIDE 0.9 % IV SOLN
1.0000 g | Freq: Once | INTRAVENOUS | Status: AC
  Administered 2020-06-26: 1 g via INTRAVENOUS
  Filled 2020-06-26: qty 10

## 2020-06-26 MED ORDER — SODIUM CHLORIDE 0.9 % IV SOLN
500.0000 mg | INTRAVENOUS | Status: DC
  Administered 2020-06-27 – 2020-06-29 (×3): 500 mg via INTRAVENOUS
  Filled 2020-06-26 (×3): qty 500

## 2020-06-26 NOTE — ED Notes (Signed)
RT placed pt on [place] 6Lpm post treatment. Pt educated on importance of him wearing his oxygen as prescribed by MD [name]. Pt also educated on the importance of staying inside when the weather conditions are not optimal for him considering his COPD. Pt states he has trouble breathing in this heat and will stay inside during hottest parts of the day to avoid worsening of his COPD symptoms. Pt states he uses his medications regularly. RT will continue to monitor.

## 2020-06-26 NOTE — ED Triage Notes (Signed)
Patient arrives with [facility] from home with shortness of breath, patient arrives on NRB, reports he woke up out of his sleep, hx of COPD, reports feeling some abdominal distention.  1 Duoneb 184/112 HR 90s 89% RA, 98% NRB

## 2020-06-26 NOTE — Plan of Care (Signed)
  Problem: Education: Goal: Knowledge of disease or condition will improve Outcome: Progressing Goal: Knowledge of the prescribed therapeutic regimen will improve Outcome: Progressing Goal: Individualized Educational Video(s) Outcome: Progressing   Problem: Activity: Goal: Ability to tolerate increased activity will improve Outcome: Progressing Goal: Will verbalize the importance of balancing activity with adequate rest periods Outcome: Progressing   Problem: Respiratory: Goal: Ability to maintain a clear airway will improve Outcome: Progressing Goal: Levels of oxygenation will improve Outcome: Progressing Goal: Ability to maintain adequate ventilation will improve Outcome: Progressing   Problem: Activity: Goal: Ability to tolerate increased activity will improve Outcome: Progressing   Problem: Clinical Measurements: Goal: Ability to maintain a body temperature in the normal range will improve Outcome: Progressing   Problem: Respiratory: Goal: Ability to maintain adequate ventilation will improve Outcome: Progressing Goal: Ability to maintain a clear airway will improve Outcome: Progressing   Problem: Education: Goal: Knowledge of General Education information will improve Description: Including pain rating scale, medication(s)/side effects and non-pharmacologic comfort measures Outcome: Progressing   Problem: Health Behavior/Discharge Planning: Goal: Ability to manage health-related needs will improve Outcome: Progressing   Problem: Clinical Measurements: Goal: Ability to maintain clinical measurements within normal limits will improve Outcome: Progressing Goal: Will remain free from infection Outcome: Progressing Goal: Diagnostic test results will improve Outcome: Progressing Goal: Respiratory complications will improve Outcome: Progressing Goal: Cardiovascular complication will be avoided Outcome: Progressing   Problem: Activity: Goal: Risk for activity  intolerance will decrease Outcome: Progressing   Problem: Nutrition: Goal: Adequate nutrition will be maintained Outcome: Progressing   Problem: Coping: Goal: Level of anxiety will decrease Outcome: Progressing   Problem: Elimination: Goal: Will not experience complications related to bowel motility Outcome: Progressing Goal: Will not experience complications related to urinary retention Outcome: Progressing   Problem: Pain Managment: Goal: General experience of comfort will improve Outcome: Progressing   Problem: Safety: Goal: Ability to remain free from injury will improve Outcome: Progressing   Problem: Skin Integrity: Goal: Risk for impaired skin integrity will decrease Outcome: Progressing   

## 2020-06-26 NOTE — ED Provider Notes (Signed)
[facility] EMERGENCY DEPARTMENT Provider Note   CSN: [id number] Arrival date & time: 06/26/20  0427     History Chief Complaint  Patient presents with   Shortness of Breath    JUDD MCCUBBIN is a 60 y.o. male.  History of COPD, Mucor pneumonia, hypertension and multiple other medical problems presents emerged from today secondary to shortness of breath.  Patient states that he woke up this way.  He worked out in the heat yesterday and had some cough and then.  He states he has been coughing up some green stuff.  No known fevers.  Has not taking breathing treatments at home.  No sick contacts.  EMS was called.  Breathing treatments given and hypoxia to 89% on room air.  Significant tachypnea with wheezing.   Shortness of Breath     Past Medical History:  Diagnosis Date   Acute on chronic respiratory failure with hypoxia ([place]) 06/08/2013   CAP (community acquired pneumonia) 05/17/2018   See admit 05/17/18 ? rml  with   covid pcr neg - rx augmentin > f/u cxr in 4-6 weeks is fine unless condition declines    Community acquired pneumonia of right lower lobe of lung 05/17/2018   See admit 05/17/18 ? 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   Tick bite 06/03/2018    Patient Active Problem List   Diagnosis Date Noted   Palliative care encounter 05/26/2020   Alcohol withdrawal syndrome, with delirium ([place])    Obesity (BMI 30.0-34.9) 04/19/2020   Pain of right forearm 12/03/2019   Bronchiectasis ([place]) 09/24/2019   Type 2 diabetes mellitus without complication, without long-term current use of insulin ([place]) 07/24/2019   Other atopic dermatitis  05/01/2019   Chronic respiratory failure with hypoxia ([place]) 04/23/2019   Intellectual disability 04/23/2019   History of alcohol use 11/23/2018   Hypertension 05/17/2016   History of tobacco abuse 04/06/2016   COPD mixed type ([place]) 03/26/2016    Past Surgical History:  Procedure Laterality Date   VIDEO ASSISTED THORACOSCOPY (VATS)/THOROCOTOMY Left 06/11/2013   Procedure: VIDEO ASSISTED THORACOSCOPY (VATS)/THOROCOTOMY;  Surgeon: Ivin Poot, MD;  Location: [place];  Service: Thoracic;  Laterality: Left;   VIDEO BRONCHOSCOPY N/A 06/11/2013   Procedure: VIDEO BRONCHOSCOPY;  Surgeon: Ivin Poot, MD;  Location: [place];  Service: Thoracic;  Laterality: N/A;   VIDEO BRONCHOSCOPY N/A 08/10/2018   Procedure: VIDEO BRONCHOSCOPY WITH FLUORO;  Surgeon: Candee Furbish, MD;  Location[facility] ENDOSCOPY;  Service: Endoscopy;  Laterality: N/A;       Family History  Problem Relation Age of Onset   Heart disease Father    COPD Sister     Social History   Tobacco Use   Smoking status: Former    Packs/day: 1.50    Years: 40.00    Pack years: 60.00    Types: Cigarettes    Quit date: 2014    Years since quitting: 8.4   Smokeless tobacco: Former    Types: Nurse, children's Use: Never used  Substance Use Topics  Alcohol use: Yes    Alcohol/week: 28.0 standard drinks    Types: 28 Cans of beer per week    Comment: 4 beers a night; + jitters with not drinking, denies DTs or seizures   Drug use: Yes    Types: Marijuana    Comment: daily; quit using crack and methamphetamine about 5 months ago     Home Medications Prior to Admission medications   Medication Sig Start Date End Date Taking? Authorizing Provider  Accu-Chek Softclix Lancets lancets Use as instructed to check blood sugar once daily. 08/15/19   [name], MD  albuterol (PROVENTIL) (2.5 MG/3ML) 0.083% nebulizer solution Take 3 mLs (2.5 mg total) by nebulization every 6 (six) hours as needed for wheezing or shortness of  breath. 06/10/20   [name], MD  albuterol (VENTOLIN HFA) 108 (90 Base) MCG/ACT inhaler Inhale 2 puffs into the lungs every 4 (four) hours as needed for wheezing or shortness of breath. 11/16/19   Ward, Delice Bison, DO  Blood Glucose Monitoring Suppl (ACCU-CHEK GUIDE ME) w/Device KIT 1 each by Other route in the morning and at bedtime. 08/15/19   [provider]  budesonide-formoterol (SYMBICORT) 160-4.5 MCG/ACT inhaler INHALE 2 PUFFS INTO THE LUNGS 2 (TWO) TIMES DAILY. 06/10/20 06/10/21  [name], MD  furosemide (LASIX) 20 MG tablet Take 1 tablet (20 mg total) by mouth daily as needed for edema. 06/10/20 06/10/21  [name], MD  glucose blood (TRUE METRIX BLOOD GLUCOSE TEST) test strip Use as instructed 08/14/19   [name], MD  guaiFENesin (MUCINEX) 600 MG 12 hr tablet TAKE 1 TABLET (600 MG TOTAL) BY MOUTH 2 (TWO) TIMES DAILY. 03/28/20 03/28/21  [name], MD  losartan (COZAAR) 100 MG tablet Take 1 tablet (100 mg total) by mouth daily. 05/26/20 05/26/21  [name], MD  metFORMIN (GLUCOPHAGE) 1000 MG tablet Take 1 tablet (1,000 mg total) by mouth 2 (two) times daily with a meal. 06/10/20 06/10/21  [name], MD  metoprolol succinate (TOPROL-XL) 25 MG 24 hr tablet Take 1 tablet (25 mg total) by mouth daily. 06/10/20 06/10/21  [name], MD  Multiple Vitamin (MULTIVITAMIN WITH MINERALS) TABS tablet Take 1 tablet by mouth daily. 03/29/20   [name], MD  pantoprazole (PROTONIX) 40 MG tablet Take 1 tablet (40 mg total) by mouth daily. 06/10/20   [name], MD  predniSONE (DELTASONE) 10 MG tablet Take 2 tablets (20 mg total) by mouth daily with breakfast. 06/10/20   [name], MD  Respiratory Therapy Supplies (FLUTTER) DEVI Use 4 times daily Patient taking differently: 1 each by Other route See admin instructions. Use 4 times daily 04/27/18   [name], MD  Tiotropium Bromide Monohydrate 2.5 MCG/ACT AERS INHALE 2 PUFFS INTO THE LUNGS  DAILY. 06/10/20 06/10/21  [name], MD    Allergies    Gabapentin  Review of Systems   Review of Systems  Respiratory:  Positive for shortness of breath.   All other systems reviewed and are negative.  Physical Exam Updated Vital Signs BP (!) 118/59   Pulse 95   Temp (!) 97.5 F (36.4 C) (Oral)   Resp (!) 28   Ht 5' 9"  (1.753 m)   Wt 92.5 kg   SpO2 97%   BMI 30.13 kg/m   Physical Exam Vitals and nursing note reviewed.  Constitutional:      Appearance: He is well-developed.  HENT:     Head: Normocephalic  and atraumatic.  Cardiovascular:     Rate and Rhythm: Normal rate.  Pulmonary:     Effort: Pulmonary effort is normal. No respiratory distress.     Breath sounds: Decreased breath sounds and wheezing present. No rhonchi or rales.  Chest:     Chest wall: No tenderness.  Abdominal:     General: There is distension.     Tenderness: There is no abdominal tenderness. There is no rebound.  Musculoskeletal:        General: Normal range of motion.     Cervical back: Normal range of motion.     Right lower leg: Edema present.     Left lower leg: Edema present.  Neurological:     Mental Status: He is alert.    ED Results / Procedures / Treatments   Labs (all labs ordered are listed, but only abnormal results are displayed) Labs Reviewed  CBC WITH DIFFERENTIAL/PLATELET - Abnormal; Notable for the following components:      Result Value   WBC 13.4 (*)    Hemoglobin 12.8 (*)    Neutro Abs 8.9 (*)    All other components within normal limits  COMPREHENSIVE METABOLIC PANEL - Abnormal; Notable for the following components:   Glucose, Bld 110 (*)    Total Protein 5.9 (*)    Alkaline Phosphatase 33 (*)    All other components within normal limits  BRAIN NATRIURETIC PEPTIDE  TROPONIN I (HIGH SENSITIVITY)  TROPONIN I (HIGH SENSITIVITY)    EKG EKG Interpretation  Date/Time:  Thursday June 26 2020 04:32:16 EDT Ventricular Rate:  92 PR Interval:  172 QRS  Duration: 99 QT Interval:  360 QTC Calculation: 446 R Axis:   64 Text Interpretation: Sinus rhythm Confirmed by Merrily Pew [phone]) on 06/26/2020 5:10:38 AM  Radiology DG Chest Portable 1 View  Result Date: 06/26/2020 CLINICAL DATA:  shortness of breath, patient arrives on NRB, reports he woke up out of his sleep, hx of COPD, reports feeling some abdominal distention. EXAM: PORTABLE CHEST 1 VIEW COMPARISON:  Chest x-ray 04/21/2020, CT chest 04/20/2020. FINDINGS: The heart size and mediastinal contours are unchanged. Aortic calcification. Interval development of left lower lung zone patchy airspace opacity. No pulmonary edema. No pleural effusion. No pneumothorax. No acute osseous abnormality.  Old healed right rib fractures. IMPRESSION: Interval development of left lower lung zone infection/inflammation. Followup PA and lateral chest X-ray is recommended in 3-4 weeks following therapy to ensure resolution and exclude underlying malignancy. Electronically Signed   By: Iven Finn M.D.   On: 06/26/2020 05:09    Procedures .Critical Care  Date/Time: 06/26/2020 6:16 AM Performed by: Merrily Pew, MD Authorized by: Merrily Pew, MD   Critical care provider statement:    Critical care time (minutes):  45   Critical care was necessary to treat or prevent imminent or life-threatening deterioration of the following conditions:  Respiratory failure   Critical care was time spent personally by me on the following activities:  Discussions with consultants, evaluation of patient's response to treatment, examination of patient, ordering and performing treatments and interventions, ordering and review of laboratory studies, ordering and review of radiographic studies, pulse oximetry, re-evaluation of patient's condition, obtaining history from patient or surrogate and review of old charts   Care discussed with: admitting provider     Medications Ordered in ED Medications  cefTRIAXone (ROCEPHIN) 1 g  in sodium chloride 0.9 % 100 mL IVPB (has no administration in time range)  azithromycin (ZITHROMAX) 500 mg in sodium  chloride 0.9 % 250 mL IVPB (has no administration in time range)  methylPREDNISolone sodium succinate (SOLU-MEDROL) 125 mg/2 mL injection 125 mg (125 mg Intravenous Given 06/26/20 0518)  ipratropium-albuterol (DUONEB) 0.5-2.5 (3) MG/3ML nebulizer solution 3 mL (3 mLs Nebulization Given 06/26/20 0519)  ipratropium-albuterol (DUONEB) 0.5-2.5 (3) MG/3ML nebulizer solution 3 mL ( Nebulization Given 06/26/20 0602)    ED Course  I have reviewed the triage vital signs and the nursing notes.  Pertinent labs & imaging results that were available during my care of the patient were reviewed by me and considered in my medical decision making (see chart for details).    MDM Rules/Calculators/A&P                         Likely COPD versus pneumonia.  Also the possibility of pulmonary edema especially with his abdominal distention and lower extremity edema. Chest x-ray interpreted by myself seems to be consistent with a left-sided pneumonia versus a symmetric pulmonary edema. Radiologist thinks is consistent with pneumonia.  Antibiotic started.  BNP is reassuring.  Had multiple breathing treatments with improvement in respiratory status and also came off the nonrebreather on 4 L this time.  More breathing treatments to be given.  Will need to be admitted at this point.  Final Clinical Impression(s) / ED Diagnoses Final diagnoses:  COPD exacerbation ([place])  Community acquired pneumonia of left lung, unspecified part of lung    Rx / DC Orders ED Discharge Orders     None        Jayven Naill, Corene Cornea, MD 06/26/20 [phone]

## 2020-06-26 NOTE — H&P (Addendum)
History and Physical    James [name] BDZ:[id number] DOB: [birth year] DOA: 06/26/2020  PCP: [name], MD Consultants:  Palliative care Patient coming from:  Home; NOK: Christianne Borrow, [phone]  Chief Complaint: SOB  HPI: James [name] is a 60 y.o. male with medical history significant of COPD on 2-4L home O2; ETOH dependence; DM; and HTN presenting with SOB.  He reports that he woke up overnight with acute onset of SOB.  He has not had other recent URI symptoms other than acutely worsened cough with white -> yellow sputum.  He denies fever.  No sick contacts.  No weight gain or edema.  He reports that he stopped smoking a while ago and stopped drinking a month ago, recognizing that it was killing him.  He usually wears 2.5L home O2 and is currently on 4L.  He reports feeling much better and thinks the neb treatment did the trick.    ED Course: Carryover, per Dr. Marlowe Sax:  60 year old male with history of COPD presenting with complaints of shortness of breath, cough, wheezing x1 day.  Hypoxic to the high 80s with EMS and was placed on supplemental oxygen and given a breathing treatment.  Tachypneic on arrival to the ED and initially required nonrebreather, now weaned down to 4 L via nasal cannula after additional bronchodilator treatments and Solu-Medrol.  Chest x-ray showing left lower lobe pneumonia.  BNP within normal range.  Troponin negative.  He was also given antibiotics for coverage of CAP.  COVID pending.  Review of Systems: As per HPI; otherwise review of systems reviewed and negative.   Ambulatory Status:  Ambulates without assistance   Past Medical History:  Diagnosis Date   Acute on chronic respiratory failure with hypoxia ([place]) 06/08/2013   CAP (community acquired pneumonia) 05/17/2018   See admit 05/17/18 ? rml  with   covid pcr neg - rx augmentin > f/u cxr in 4-6 weeks is fine unless condition declines    Community acquired pneumonia of right lower lobe of  lung 05/17/2018   See admit 05/17/18 ? 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   Tick bite 06/03/2018    Past Surgical History:  Procedure Laterality Date   VIDEO ASSISTED THORACOSCOPY (VATS)/THOROCOTOMY Left 06/11/2013   Procedure: VIDEO ASSISTED THORACOSCOPY (VATS)/THOROCOTOMY;  Surgeon: Ivin Poot, MD;  Location: [facility];  Service: Thoracic;  Laterality: Left;   VIDEO BRONCHOSCOPY N/A 06/11/2013   Procedure: VIDEO BRONCHOSCOPY;  Surgeon: Ivin Poot, MD;  Location: [place];  Service: Thoracic;  Laterality: N/A;   VIDEO BRONCHOSCOPY N/A 08/10/2018   Procedure: VIDEO BRONCHOSCOPY WITH FLUORO;  Surgeon: Candee Furbish, MD;  Location: [facility];  Service: Endoscopy;  Laterality: N/A;    Social History   Socioeconomic History   Marital status: Divorced    Spouse name: Not on file   Number of children: Not on file   Years of education: Not on file   Highest education level: Not on file  Occupational History   Occupation: Dealer   Occupation: Painter  Tobacco Use   Smoking status:  Former    Packs/day: 1.50    Years: 40.00    Pack years: 60.00    Types: Cigarettes    Quit date: 2014    Years since quitting: 8.4   Smokeless tobacco: Former    Types: Nurse, children's Use: Never used  Substance and Sexual Activity   Alcohol use: Yes    Alcohol/week: 28.0 standard drinks    Types: 28 Cans of beer per week    Comment: 4 beers a night; + jitters with not drinking, denies DTs or seizures   Drug use: Yes    Types: Marijuana    Comment: daily; quit using crack and methamphetamine about 5 months ago    Sexual activity: Not Currently    Partners:  Female  Other Topics Concern   Not on file  Social History Narrative   Lives alone near [place] Determinants of Health   Financial Resource Strain: Not on file  Food Insecurity: Not on file  Transportation Needs: Not on file  Physical Activity: Not on file  Stress: Not on file  Social Connections: Not on file  Intimate Partner Violence: Not on file    Allergies  Allergen Reactions   Gabapentin Other (See Comments)    hallucinations    Family History  Problem Relation Age of Onset   Heart disease Father    COPD Sister     Prior to Admission medications   Medication Sig Start Date End Date Taking? Authorizing Provider  albuterol (PROVENTIL) (2.5 MG/3ML) 0.083% nebulizer solution Take 3 mLs (2.5 mg total) by nebulization every 6 (six) hours as needed for wheezing or shortness of breath. 06/10/20  Yes [name], MD  albuterol (VENTOLIN HFA) 108 (90 Base) MCG/ACT inhaler Inhale 2 puffs into the lungs every 4 (four) hours as needed for wheezing or shortness of breath. 11/16/19  Yes Ward, Kristen N, DO  budesonide-formoterol (SYMBICORT) 160-4.5 MCG/ACT inhaler INHALE 2 PUFFS INTO THE LUNGS 2 (TWO) TIMES DAILY. 06/10/20 06/10/21 Yes [name], MD  furosemide (LASIX) 20 MG tablet Take 1 tablet (20 mg total) by mouth daily as needed for edema. 06/10/20 06/10/21 Yes [name], MD  guaiFENesin (MUCINEX) 600 MG 12 hr tablet TAKE 1 TABLET (600 MG TOTAL) BY MOUTH 2 (TWO) TIMES DAILY. Patient taking differently: Take 600 mg by mouth 2 (two) times daily as needed for cough or to loosen phlegm. 03/28/20 03/28/21 Yes [name], MD  losartan (COZAAR) 100 MG tablet Take 1 tablet (100 mg total) by mouth daily. 05/26/20 05/26/21 Yes [name], MD  metoprolol succinate (TOPROL-XL) 25 MG 24 hr tablet Take 1 tablet (25 mg total) by mouth daily. 06/10/20 06/10/21 Yes [name], MD  predniSONE (DELTASONE) 10 MG tablet Take 2 tablets (20 mg total) by mouth daily with  breakfast. 06/10/20  Yes [name], MD  Tiotropium Bromide Monohydrate 2.5 MCG/ACT AERS INHALE 2 PUFFS INTO THE LUNGS DAILY. 06/10/20 06/10/21 Yes [name], MD  Accu-Chek Softclix Lancets lancets Use as instructed to check blood sugar once daily. Patient not taking: Reported on 06/26/2020 08/15/19   [name], MD  Blood Glucose Monitoring Suppl (ACCU-CHEK GUIDE ME) w/Device KIT 1 each by Other route in the morning and at bedtime. Patient not taking: Reported on 06/26/2020 08/15/19   [provider]  glucose blood (TRUE METRIX BLOOD GLUCOSE TEST) test strip Use as instructed Patient not taking: Reported on 06/26/2020 08/14/19  [name], MD  metFORMIN (GLUCOPHAGE) 1000 MG tablet Take 1 tablet (1,000 mg total) by mouth 2 (two) times daily with a meal. Patient not taking: Reported on 06/26/2020 06/10/20 06/10/21  [name], MD  Multiple Vitamin (MULTIVITAMIN WITH MINERALS) TABS tablet Take 1 tablet by mouth daily. Patient not taking: Reported on 06/26/2020 03/29/20   [name], MD  pantoprazole (PROTONIX) 40 MG tablet Take 1 tablet (40 mg total) by mouth daily. Patient not taking: Reported on 06/26/2020 06/10/20   [name], MD  Respiratory Therapy Supplies (FLUTTER) DEVI Use 4 times daily Patient taking differently: 1 each by Other route See admin instructions. Use 4 times daily 04/27/18   [name], MD    Physical Exam: Vitals:   06/26/20 1015 06/26/20 1100 06/26/20 1112 06/26/20 1421  BP: 131/85  130/63   Pulse: 94  95   Resp: 20  15   Temp: 98.4 F (36.9 C)     TempSrc:      SpO2: 93% 98% 97% 98%  Weight:      Height:         General:  Appears calm and comfortable and is in NAD Eyes:  PERRL, EOMI, normal lids, iris ENT:  grossly normal hearing, lips & tongue, mmm; edentulous Neck:  no LAD, masses or thyromegaly Cardiovascular:  RR with mild tachycardia, no m/r/g. No LE edema.  Respiratory:   Mild diffuse wheezing with reasonable  air movement. Normal respiratory effort. Abdomen:  soft, NT, ND Back:   normal alignment, no CVAT Skin:  no rash or induration seen on limited exam Musculoskeletal:  grossly normal tone BUE/BLE, good ROM, no bony abnormality Psychiatric:  grossly normal mood and affect, speech fluent and appropriate, AOx3 Neurologic:  CN 2-12 grossly intact, moves all extremities in coordinated fashion    Radiological Exams on Admission: Independently reviewed - see discussion in A/P where applicable  DG Chest Portable 1 View  Result Date: 06/26/2020 CLINICAL DATA:  shortness of breath, patient arrives on NRB, reports he woke up out of his sleep, hx of COPD, reports feeling some abdominal distention. EXAM: PORTABLE CHEST 1 VIEW COMPARISON:  Chest x-ray 04/21/2020, CT chest 04/20/2020. FINDINGS: The heart size and mediastinal contours are unchanged. Aortic calcification. Interval development of left lower lung zone patchy airspace opacity. No pulmonary edema. No pleural effusion. No pneumothorax. No acute osseous abnormality.  Old healed right rib fractures. IMPRESSION: Interval development of left lower lung zone infection/inflammation. Followup PA and lateral chest X-ray is recommended in 3-4 weeks following therapy to ensure resolution and exclude underlying malignancy. Electronically Signed   By: Iven Finn M.D.   On: 06/26/2020 05:09    EKG: Independently reviewed.  NSR with rate 92; nonspecific ST changes with no evidence of acute ischemia   Labs on Admission: I have personally reviewed the available labs and imaging studies at the time of the admission.  Pertinent labs:   Glucose 110 BNP 28.2 HS troponin 15, 14 WBC 13.4 COVID/flu negative   Assessment/Plan Principal Problem:   CAP (community acquired pneumonia) Active Problems:   COPD mixed type ([place])   History of tobacco abuse   Hypertension   History of alcohol use   Chronic respiratory failure with hypoxia (HCC)   Type 2 diabetes  mellitus without complication, without long-term current use of insulin (HCC)   Obesity (BMI 30.0-34.9)   CAP -Patient presenting with productive cough, acute onset of SOB, and infiltrate in left lower lobe on chest  x-ray -Infiltrate on CXR and 2-3 characteristics (fever, leukocytosis, purulent sputum) are consistent with pneumonia. -This appears to be most likely community-acquired pneumonia.  -He has underlying COPD and may also have a component of exacerbation there -Influenza negative. -COVID-19 negative. -Gram stain and culture is recommended by IDSA guidelines for patients with severe CAP; those being empirically treated for or previously infected with MRSA/Pseudomonas; or those who were hospitalized and received parenteral antibiotics in the last 90 days.  However, will order sputum gram stain and culture because: RF for drug-resistant organisms are ill-defined; + cultures help to tailor treatment; negative cultures can help to stop antibiotics early; culture data helps generate antibiograms; and viruses can coexist with bacterial infections. -Will order lower respiratory tract procalcitonin level.   -CURB-65 score is 0  -Pneumonia Severity Index (PSI) is Class 2, <1% mortality. -Will start Azithromycin 500 mg IV daily and Rocephin due to no risk factors for MDR cause  -Additional complicating factors include: hypoxia; worsening of co-morbid condition (COPD). -LR @ 75cc/hr -Fever control -Repeat CBC in am -Will add albuterol PRN -Will add standing Duonebs -Will add Mucinex for cough -Will monitor on telemetry bed -F/u CXR recommended in 3-4 weeks  Acute on chronic respiratory failure associated with a COPD exacerbation -He has history of O2-dependent COPD but had increased O2 requirement overnight, transitioned by EMS to NRB and on 4L at the time of my evaluation -She has history of concomitant asthma and sees pulmonology -She ran out of her inhaler and her nebulizer machine is  broken -She does not have fever and has mild leukocytosis.  -Chest x-ray is not consistent with pneumonia -She was given a continuous neb treatment in the ED with some improvement.   -will observe for now and attempt to wean off [place] O2 if possible -will admit patient - with her failure of outpatient therapy and markedly decreased O2 sats (into the 60-70s), it seems likely that she will need several days of hospitalization to show sufficient improvement for discharge. -Nebulizers: scheduled Duoneb and prn albuterol -Solu-Medrol 60 mg IV BID -> Prednisone 40 mg PO daily -Continue Symbicort ([employer] formulary substitution) -Coordinated care with TOC team/PT/OT/Nutrition/RT consults  HTN -Continue Cozaar  DM -Will check A1c, previously 6.7 -He has not been taking Glucophage -Cover with moderate-scale SSI  -Diabetes coordinator consulted  ETOH dependence, in remission -No ETOH in about 1 month, praise provided -Ongoing efforts at sustained remission are encouraged  Obesity -Body mass index is 30.13 kg/m..  -Weight loss should be encouraged -Outpatient PCP/bariatric medicine f/u encouraged     Note: This patient has been tested and is negative for the novel coronavirus COVID-19.    Level of care: Progressive  DVT prophylaxis: Lovenox  Code Status:  FULL - confirmed with patient Family Communication: None present Disposition Plan:  The patient is from: home  Anticipated d/c is to: home without [facility] services   Anticipated d/c date will depend on clinical response to treatment, but possibly in the next 2-3 days if she has excellent response to treatment  Patient is currently: acutely ill Consults called: TOC team/PT/OT/Nutrition/RT/Diabetes coordinator  Admission status: Admit - It is my clinical opinion that admission to INPATIENT is reasonable and necessary because this patient will require at least 2 midnights in the hospital to treat this condition based on the medical complexity  of the problems presented.  Given the aforementioned information, the predictability of an adverse outcome is felt to be significant.     Karmen Bongo MD [facility]   How to contact the [facility] Attending or Consulting provider [place] or covering provider during after hours [place], for this patient?  Check the care team in [facility] and look for a) attending/consulting TR[facility] provider listed and b) the [facility] team listed Log into [email or web address] and use [place]'s universal password to access. If you do not have the password, please contact the hospital operator. Locate the [facility] provider you are looking for under [facility] and page to a number that you can be directly reached. If you still have difficulty reaching the provider, please page the [facility] (Director on Call) for the Hospitalists listed on amion for assistance.   06/26/2020, 6:12 PM
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
## 2020-06-26 NOTE — Progress Notes (Signed)
Inpatient Diabetes Program Recommendations  AACE/ADA: New Consensus Statement on Inpatient Glycemic Control (2015)  Target Ranges:  Prepandial:   less than 140 mg/dL      Peak postprandial:   less than 180 mg/dL (1-2 hours)      Critically ill patients:  140 - 180 mg/dL   Lab Results  Component Value Date   GLUCAP 208 (H) 04/24/2020   HGBA1C 6.7 (H) 03/26/2020    Review of Glycemic Control Results for James, [name] (MRN [id number]) as of 06/26/2020 09:59  Ref. Range 06/26/2020 04:32  Glucose Latest Ref Range: 70 - 99 mg/dL 110 (H)   Diabetes history: Type 2 DM Outpatient Diabetes medications: Metformin 1000 mg BID (Not taking) Current orders for Inpatient glycemic control: Novolog 0-15 units TID, Novolog 0-5 units QHS Prednisone 40 mg QM til 28th Solumedrol 60 mg BID til 24th  Inpatient Diabetes Program Recommendations:    Noted consult. Patient is followed by [facility] with last appointment 06/10/20. A1C from 03/2020 was 6.7% indicating glycemic control at goal. Consider repeating A1C?  Anticipate glucose to increase with steroids. Will follow.   Thanks, Bronson Curb, MSN, RNC-OB Diabetes Coordinator [phone] (8a-5p)

## 2020-06-27 LAB — CBC
HCT: 38.6 % — ABNORMAL LOW (ref 39.0–52.0)
Hemoglobin: 12.1 g/dL — ABNORMAL LOW (ref 13.0–17.0)
MCH: 29.3 pg (ref 26.0–34.0)
MCHC: 31.3 g/dL (ref 30.0–36.0)
MCV: 93.5 fL (ref 80.0–100.0)
Platelets: 296 10*3/uL (ref 150–400)
RBC: 4.13 MIL/uL — ABNORMAL LOW (ref 4.22–5.81)
RDW: 14.4 % (ref 11.5–15.5)
WBC: 12.4 10*3/uL — ABNORMAL HIGH (ref 4.0–10.5)
nRBC: 0 % (ref 0.0–0.2)

## 2020-06-27 LAB — GLUCOSE, CAPILLARY
Glucose-Capillary: 134 mg/dL — ABNORMAL HIGH (ref 70–99)
Glucose-Capillary: 231 mg/dL — ABNORMAL HIGH (ref 70–99)
Glucose-Capillary: 151 mg/dL — ABNORMAL HIGH (ref 70–99)
Glucose-Capillary: 181 mg/dL — ABNORMAL HIGH (ref 70–99)

## 2020-06-27 LAB — BASIC METABOLIC PANEL
Anion gap: 8 (ref 5–15)
BUN: 18 mg/dL (ref 6–20)
CO2: 30 mmol/L (ref 22–32)
Calcium: 9.2 mg/dL (ref 8.9–10.3)
Chloride: 100 mmol/L (ref 98–111)
Creatinine, Ser: 0.91 mg/dL (ref 0.61–1.24)
GFR, Estimated: >60 mL/min (ref >60)
Glucose, Bld: 166 mg/dL — ABNORMAL HIGH (ref 70–99)
Potassium: 4.6 mmol/L (ref 3.5–5.1)
Sodium: 138 mmol/L (ref 135–145)

## 2020-06-27 NOTE — Progress Notes (Signed)
PROGRESS NOTE    James [name]  RXV:[id number] DOB: [birth year] DOA: 06/26/2020 PCP: [name], MD  [phone]   Assessment & Plan:   Principal Problem:   CAP (community acquired pneumonia) Active Problems:   COPD mixed type ([name])   History of tobacco abuse   Hypertension   History of alcohol use   Chronic respiratory failure with hypoxia ([name])   Type 2 diabetes mellitus without complication, without long-term current use of insulin (HCC)   Obesity (BMI 30.0-34.9)    James [name] is a 60 y.o. male with medical history significant of COPD on 2-4L home O2; ETOH dependence; DM; and HTN presenting with SOB.     CAP Recurrent PNA -Patient presenting with productive cough, acute onset of SOB, and infiltrate in left lower lobe on chest x-ray --per pt, he had just finished treatment for PNA about 2 months ago. -Influenza negative. -COVID-19 negative. --procal <0.1 -started on ceftriaxone and azithro on admission Plan: --cont ceftriaxone and azithro --d/c MIVF  Acute on chronic respiratory failure associated with a COPD exacerbation -He has history of O2-dependent COPD but had increased O2 requirement overnight, transitioned by EMS to NRB and on 4L at the time of admission. --Pt is steroid-dependent, and on 10 mg daily chronically (unable to be weaned off).  Sees pulmonology outpatient --started on IV solumedrol on admission. Plan: --cont steroid as prednisone with taper --cont DuoNeb and home bronchodilators --Continue supplemental O2 to keep sats between 88-92%, wean as tolerated  HTN -cont Losartan   DM, well controlled Hyperglycemia due to steroid -A1c 6.5 --SSI   ETOH dependence, in remission -No ETOH in about 1 month, praise provided -Ongoing efforts at sustained remission are encouraged   Obesity -Body mass index is 30.13 kg/m.. -Weight loss should be encouraged -Outpatient PCP/bariatric medicine f/u encouraged    DVT prophylaxis: Lovenox  SQ Code Status: Full code  Family Communication:  Level of care: Med-Surg Dispo:   The patient is from: home Anticipated d/c is to: home Anticipated d/c date is: 1-2 days Patient currently is not medically ready to d/c due to: IV abx for PNA   Subjective and Interval History:  Pt reported feeling better already.  Dyspnea improved.  Having some problem bringing up sputum.  Normal oral intake.   Objective: Vitals:   06/27/20 0729 06/27/20 0731 06/27/20 0819 06/27/20 1447  BP:   (!) 149/77   Pulse:   81   Resp:   15   Temp:   98.4 F (36.9 C)   TempSrc:   Oral   SpO2: 98% 98% 96% 98%  Weight:      Height:        Intake/Output Summary (Last 24 hours) at 06/27/2020 1544 Last data filed at 06/26/2020 1551 Gross per 24 hour  Intake 27.82 ml  Output --  Net 27.82 ml   Filed Weights   06/26/20 0431  Weight: 92.5 kg    Examination:   Constitutional: NAD, AAOx3 HEENT: conjunctivae and lids normal, EOMI CV: No cyanosis.   RESP: normal respiratory effort, reduced lung sounds, no wheezes, on 3L Extremities: No effusions, edema in BLE SKIN: warm, dry Neuro: II - XII grossly intact.   Psych: Normal mood and affect.  Appropriate judgement and reason   Data Reviewed: I have personally reviewed following labs and imaging studies  CBC: Recent Labs  Lab 06/26/20 0432 06/27/20 0044  WBC 13.4* 12.4*  NEUTROABS 8.9*  --   HGB 12.8* 12.1*  HCT 41.9  38.6*  MCV 94.6 93.5  PLT 300 834   Basic Metabolic Panel: Recent Labs  Lab 06/26/20 0432 06/27/20 0044  NA 140 138  K 4.3 4.6  CL 102 100  CO2 31 30  GLUCOSE 110* 166*  BUN 16 18  CREATININE 1.05 0.91  CALCIUM 9.0 9.2   GFR: Estimated Creatinine Clearance: 98.2 mL/min (by C-G formula based on SCr of 0.91 mg/dL). Liver Function Tests: Recent Labs  Lab 06/26/20 0432  AST 27  ALT 25  ALKPHOS 33*  BILITOT 0.4  PROT 5.9*  ALBUMIN 3.8   No results for input(s): LIPASE, AMYLASE in the last 168 hours. No results  for input(s): AMMONIA in the last 168 hours. Coagulation Profile: No results for input(s): INR, PROTIME in the last 168 hours. Cardiac Enzymes: No results for input(s): CKTOTAL, CKMB, CKMBINDEX, TROPONINI in the last 168 hours. BNP (last 3 results) No results for input(s): PROBNP in the last 8760 hours. HbA1C: Recent Labs    06/26/20 1840  HGBA1C 6.5*   CBG: Recent Labs  Lab 06/26/20 1247 06/26/20 1624 06/26/20 2117 06/27/20 0810 06/27/20 1216  GLUCAP 229* 243* 157* 134* 231*   Lipid Profile: No results for input(s): CHOL, HDL, LDLCALC, TRIG, CHOLHDL, LDLDIRECT in the last 72 hours. Thyroid Function Tests: No results for input(s): TSH, T4TOTAL, FREET4, T3FREE, THYROIDAB in the last 72 hours. Anemia Panel: No results for input(s): VITAMINB12, FOLATE, FERRITIN, TIBC, IRON, RETICCTPCT in the last 72 hours. Sepsis Labs: Recent Labs  Lab 06/26/20 1840  PROCALCITON <0.10    Recent Results (from the past 240 hour(s))  Resp Panel by RT-PCR (Flu A&B, Covid) Nasopharyngeal Swab     Status: None   Collection Time: 06/26/20  6:13 AM   Specimen: Nasopharyngeal Swab; Nasopharyngeal(NP) swabs in vial transport medium  Result Value Ref Range Status   SARS Coronavirus 2 by RT PCR NEGATIVE NEGATIVE Final    Comment: (NOTE) SARS-CoV-2 target nucleic acids are NOT DETECTED.  The SARS-CoV-2 RNA is generally detectable in upper respiratory specimens during the acute phase of infection. The lowest concentration of SARS-CoV-2 viral copies this assay can detect is 138 copies/mL. A negative result does not preclude SARS-Cov-2 infection and should not be used as the sole basis for treatment or other patient management decisions. A negative result may occur with  improper specimen collection/handling, submission of specimen other than nasopharyngeal swab, presence of viral mutation(s) within the areas targeted by this assay, and inadequate number of viral copies(<138 copies/mL). A negative  result must be combined with clinical observations, patient history, and epidemiological information. The expected result is Negative.  Fact Sheet for Patients:  EntrepreneurPulse.com.au  Fact Sheet for Healthcare Providers:  IncredibleEmployment.be  This test is no t yet approved or cleared by the [place] FDA and  has been authorized for detection and/or diagnosis of SARS-CoV-2 by FDA under an Emergency Use Authorization (EUA). This EUA will remain  in effect (meaning this test can be used) for the duration of the COVID-19 declaration under Section 564(b)(1) of the Act, 21 U.S.C.section 360bbb-3(b)(1), unless the authorization is terminated  or revoked sooner.       Influenza A by PCR NEGATIVE NEGATIVE Final   Influenza B by PCR NEGATIVE NEGATIVE Final    Comment: (NOTE) The Xpert Xpress SARS-CoV-2/FLU/RSV plus assay is intended as an aid in the diagnosis of influenza from Nasopharyngeal swab specimens and should not be used as a sole basis for treatment. Nasal washings and aspirates are unacceptable for Xpert  Xpress SARS-CoV-2/FLU/RSV testing.  Fact Sheet for Patients: EntrepreneurPulse.com.au  Fact Sheet for Healthcare Providers: IncredibleEmployment.be  This test is not yet approved or cleared by the [place] FDA and has been authorized for detection and/or diagnosis of SARS-CoV-2 by FDA under an Emergency Use Authorization (EUA). This EUA will remain in effect (meaning this test can be used) for the duration of the COVID-19 declaration under Section 564(b)(1) of the Act, 21 U.S.C. section 360bbb-3(b)(1), unless the authorization is terminated or revoked.  Performed at [facility], [place] [address] [place], [place] [address]       Radiology Studies: DG Chest Portable 1 View  Result Date: 06/26/2020 CLINICAL DATA:  shortness of breath, patient arrives on NRB, reports he woke  up out of his sleep, hx of COPD, reports feeling some abdominal distention. EXAM: PORTABLE CHEST 1 VIEW COMPARISON:  Chest x-ray 04/21/2020, CT chest 04/20/2020. FINDINGS: The heart size and mediastinal contours are unchanged. Aortic calcification. Interval development of left lower lung zone patchy airspace opacity. No pulmonary edema. No pleural effusion. No pneumothorax. No acute osseous abnormality.  Old healed right rib fractures. IMPRESSION: Interval development of left lower lung zone infection/inflammation. Followup PA and lateral chest X-ray is recommended in 3-4 weeks following therapy to ensure resolution and exclude underlying malignancy. Electronically Signed   By: Iven Finn M.D.   On: 06/26/2020 05:09     Scheduled Meds:  docusate sodium  100 mg Oral BID   enoxaparin (LOVENOX) injection  40 mg Subcutaneous Q24H   insulin aspart  0-15 Units Subcutaneous TID WC   insulin aspart  0-5 Units Subcutaneous QHS   ipratropium-albuterol  3 mL Nebulization TID   losartan  100 mg Oral Daily   metoprolol succinate  25 mg Oral Daily   mometasone-formoterol  2 puff Inhalation BID   nicotine  14 mg Transdermal Daily   predniSONE  40 mg Oral Q breakfast   sodium chloride flush  3 mL Intravenous Q12H   Continuous Infusions:  azithromycin 500 mg (06/27/20 1022)   cefTRIAXone (ROCEPHIN)  IV 2 g (06/27/20 0903)     LOS: 1 day     [name], MD [facility] If 7PM-7AM, please contact night-coverage 06/27/2020, 3:44 PM

## 2020-06-27 NOTE — Plan of Care (Signed)
  Problem: Activity: Goal: Ability to tolerate increased activity will improve Outcome: Progressing   

## 2020-06-27 NOTE — Progress Notes (Signed)
Nutrition Brief Note  RD consulted for assessment of nutritional requirements/ status.  Wt Readings from Last 15 Encounters:  06/26/20 92.5 kg  06/10/20 92.7 kg  04/21/20 91.9 kg  04/14/20 91 kg  02/13/20 91.6 kg  12/03/19 87.1 kg  11/19/19 82.6 kg  11/16/19 82.6 kg  11/13/19 86.6 kg  10/26/19 81.3 kg  10/02/19 86.8 kg  09/24/19 82.1 kg  08/23/19 86.6 kg  07/23/19 84.8 kg  07/06/19 78.5 kg   James [name] is a 60 y.o. male with medical history significant of COPD on 2-4L home O2; ETOH dependence; DM; and HTN presenting with SOB.  He reports that he woke up overnight with acute onset of SOB.  He has not had other recent URI symptoms other than acutely worsened cough with white -> yellow sputum.  He denies fever.  No sick contacts.  No weight gain or edema.  He reports that he stopped smoking a while ago and stopped drinking a month ago, recognizing that it was killing him.  He usually wears 2.5L home O2 and is currently on 4L.  He reports feeling much better and thinks the neb treatment did the trick.  Pt admitted with CAP and COPD exacerbation.   Reviewed I/O's: +28 ml x 24 hours  Spoke with pt, who was sitting in recliner chair at time of visit. He reports good appetite and is hungry; waiting for breakfast tray. He reports he eats 3 meals per day (Breakfast and Lunch: hamd, eggs, and cheese sandwich; Dinner: meat, starch, and vegetable).   Reviewed wt hx; wt has been stable over the past 2 months. Pt denies any weight loss.   Nutrition-Focused physical exam completed. Findings are no fat depletion, no muscle depletion, and no edema.    Medications reviewed and include colace and prednisone.   Lab Results  Component Value Date   HGBA1C 6.5 (H) 06/26/2020  PTA DM medications are 1000 mg metformin BID.   Labs reviewed: CBGS: [id number] (inpatient orders for glycemic control are 0-15 units insulin aspart TID with meals and 0-5 units insulin aspart daily at bedtime).    Body mass  index is 30.13 kg/m. Patient meets criteria for obesity, class I based on current BMI.   Current diet order is carb modified, patient is consuming approximately 100% of meals at this time. Labs and medications reviewed.   No nutrition interventions warranted at this time. If nutrition issues arise, please consult RD.   Loistine Chance, RD, LDN, [place] Registered Dietitian II Certified Diabetes Care and Education Specialist Please refer to [facility] for RD and/or RD on-call/weekend/after hours pager

## 2020-06-27 NOTE — Plan of Care (Signed)
  Problem: Education: Goal: Knowledge of disease or condition will improve Outcome: Progressing Goal: Knowledge of the prescribed therapeutic regimen will improve Outcome: Progressing Goal: Individualized Educational Video(s) Outcome: Progressing   Problem: Activity: Goal: Ability to tolerate increased activity will improve Outcome: Progressing Goal: Will verbalize the importance of balancing activity with adequate rest periods Outcome: Progressing   Problem: Respiratory: Goal: Ability to maintain a clear airway will improve Outcome: Progressing Goal: Levels of oxygenation will improve Outcome: Progressing Goal: Ability to maintain adequate ventilation will improve Outcome: Progressing   Problem: Activity: Goal: Ability to tolerate increased activity will improve Outcome: Progressing   Problem: Clinical Measurements: Goal: Ability to maintain a body temperature in the normal range will improve Outcome: Progressing   Problem: Respiratory: Goal: Ability to maintain adequate ventilation will improve Outcome: Progressing Goal: Ability to maintain a clear airway will improve Outcome: Progressing   Problem: Education: Goal: Knowledge of General Education information will improve Description: Including pain rating scale, medication(s)/side effects and non-pharmacologic comfort measures Outcome: Progressing   Problem: Health Behavior/Discharge Planning: Goal: Ability to manage health-related needs will improve Outcome: Progressing   Problem: Clinical Measurements: Goal: Ability to maintain clinical measurements within normal limits will improve Outcome: Progressing Goal: Will remain free from infection Outcome: Progressing Goal: Diagnostic test results will improve Outcome: Progressing Goal: Respiratory complications will improve Outcome: Progressing Goal: Cardiovascular complication will be avoided Outcome: Progressing   Problem: Activity: Goal: Risk for activity  intolerance will decrease Outcome: Progressing   Problem: Nutrition: Goal: Adequate nutrition will be maintained Outcome: Progressing   Problem: Coping: Goal: Level of anxiety will decrease Outcome: Progressing   Problem: Elimination: Goal: Will not experience complications related to bowel motility Outcome: Progressing Goal: Will not experience complications related to urinary retention Outcome: Progressing   Problem: Pain Managment: Goal: General experience of comfort will improve Outcome: Progressing   Problem: Safety: Goal: Ability to remain free from injury will improve Outcome: Progressing   Problem: Skin Integrity: Goal: Risk for impaired skin integrity will decrease Outcome: Progressing   
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
## 2020-06-27 NOTE — Plan of Care (Signed)
Problem: Education: Goal: Knowledge of disease or condition will improve 06/27/2020 2050 by Berta Minor, RN Outcome: Progressing 06/27/2020 2044 by Berta Minor, RN Outcome: Progressing Goal: Knowledge of the prescribed therapeutic regimen will improve 06/27/2020 2050 by Berta Minor, RN Outcome: Progressing 06/27/2020 2044 by Berta Minor, RN Outcome: Progressing Goal: Individualized Educational Video(s) 06/27/2020 2050 by Berta Minor, RN Outcome: Progressing 06/27/2020 2044 by Berta Minor, RN Outcome: Progressing   Problem: Activity: Goal: Ability to tolerate increased activity will improve 06/27/2020 2050 by Berta Minor, RN Outcome: Progressing 06/27/2020 2044 by Berta Minor, RN Outcome: Progressing Goal: Will verbalize the importance of balancing activity with adequate rest periods 06/27/2020 2050 by Berta Minor, RN Outcome: Progressing 06/27/2020 2044 by Berta Minor, RN Outcome: Progressing   Problem: Respiratory: Goal: Ability to maintain a clear airway will improve 06/27/2020 2050 by Berta Minor, RN Outcome: Progressing 06/27/2020 2044 by Berta Minor, RN Outcome: Progressing Goal: Levels of oxygenation will improve 06/27/2020 2050 by Berta Minor, RN Outcome: Progressing 06/27/2020 2044 by Berta Minor, RN Outcome: Progressing Goal: Ability to maintain adequate ventilation will improve 06/27/2020 2050 by Berta Minor, RN Outcome: Progressing 06/27/2020 2044 by Berta Minor, RN Outcome: Progressing   Problem: Activity: Goal: Ability to tolerate increased activity will improve 06/27/2020 2050 by Berta Minor, RN Outcome: Progressing 06/27/2020 2044 by Berta Minor, RN Outcome: Progressing   Problem: Clinical Measurements: Goal: Ability to maintain a body temperature in the normal range will improve 06/27/2020 2050 by  Berta Minor, RN Outcome: Progressing 06/27/2020 2044 by Berta Minor, RN Outcome: Progressing   Problem: Respiratory: Goal: Ability to maintain adequate ventilation will improve 06/27/2020 2050 by Berta Minor, RN Outcome: Progressing 06/27/2020 2044 by Berta Minor, RN Outcome: Progressing Goal: Ability to maintain a clear airway will improve 06/27/2020 2050 by Berta Minor, RN Outcome: Progressing 06/27/2020 2044 by Berta Minor, RN Outcome: Progressing   Problem: Education: Goal: Knowledge of General Education information will improve Description: Including pain rating scale, medication(s)/side effects and non-pharmacologic comfort measures 06/27/2020 2050 by Berta Minor, RN Outcome: Progressing 06/27/2020 2044 by Berta Minor, RN Outcome: Progressing   Problem: Health Behavior/Discharge Planning: Goal: Ability to manage health-related needs will improve 06/27/2020 2050 by Berta Minor, RN Outcome: Progressing 06/27/2020 2044 by Berta Minor, RN Outcome: Progressing   Problem: Clinical Measurements: Goal: Ability to maintain clinical measurements within normal limits will improve 06/27/2020 2050 by Berta Minor, RN Outcome: Progressing 06/27/2020 2044 by Berta Minor, RN Outcome: Progressing Goal: Will remain free from infection 06/27/2020 2050 by Berta Minor, RN Outcome: Progressing 06/27/2020 2044 by Berta Minor, RN Outcome: Progressing Goal: Diagnostic test results will improve 06/27/2020 2050 by Berta Minor, RN Outcome: Progressing 06/27/2020 2044 by Berta Minor, RN Outcome: Progressing Goal: Respiratory complications will improve 06/27/2020 2050 by Berta Minor, RN Outcome: Progressing 06/27/2020 2044 by Berta Minor, RN Outcome: Progressing Goal: Cardiovascular complication will be avoided 06/27/2020 2050 by Berta Minor, RN Outcome: Progressing 06/27/2020 2044 by Berta Minor, RN Outcome: Progressing   Problem: Activity: Goal: Risk for activity intolerance will decrease 06/27/2020 2050 by Berta Minor, RN Outcome: Progressing 06/27/2020 2044 by Berta Minor, RN Outcome: Progressing   Problem: Nutrition: Goal: Adequate nutrition will be maintained 06/27/2020 2050 by Berta Minor, RN Outcome: Progressing 06/27/2020 2044 by Berta Minor, RN  Outcome: Progressing   Problem: Coping: Goal: Level of anxiety will decrease 06/27/2020 2050 by Berta Minor, RN Outcome: Progressing 06/27/2020 2044 by Berta Minor, RN Outcome: Progressing   Problem: Elimination: Goal: Will not experience complications related to bowel motility 06/27/2020 2050 by Berta Minor, RN Outcome: Progressing 06/27/2020 2044 by Berta Minor, RN Outcome: Progressing Goal: Will not experience complications related to urinary retention 06/27/2020 2050 by Berta Minor, RN Outcome: Progressing 06/27/2020 2044 by Berta Minor, RN Outcome: Progressing   Problem: Pain Managment: Goal: General experience of comfort will improve 06/27/2020 2050 by Berta Minor, RN Outcome: Progressing 06/27/2020 2044 by Berta Minor, RN Outcome: Progressing   Problem: Safety: Goal: Ability to remain free from injury will improve 06/27/2020 2050 by Berta Minor, RN Outcome: Progressing 06/27/2020 2044 by Berta Minor, RN Outcome: Progressing   Problem: Skin Integrity: Goal: Risk for impaired skin integrity will decrease 06/27/2020 2050 by Berta Minor, RN Outcome: Progressing 06/27/2020 2044 by Berta Minor, RN Outcome: Progressing

## 2020-06-28 LAB — BASIC METABOLIC PANEL
Anion gap: 4 — ABNORMAL LOW (ref 5–15)
BUN: 18 mg/dL (ref 6–20)
CO2: 30 mmol/L (ref 22–32)
Calcium: 9.1 mg/dL (ref 8.9–10.3)
Chloride: 108 mmol/L (ref 98–111)
Creatinine, Ser: 0.79 mg/dL (ref 0.61–1.24)
GFR, Estimated: >60 mL/min (ref >60)
Glucose, Bld: 89 mg/dL (ref 70–99)
Potassium: 4.4 mmol/L (ref 3.5–5.1)
Sodium: 142 mmol/L (ref 135–145)

## 2020-06-28 LAB — CBC
HCT: 37.8 % — ABNORMAL LOW (ref 39.0–52.0)
Hemoglobin: 11.6 g/dL — ABNORMAL LOW (ref 13.0–17.0)
MCH: 29 pg (ref 26.0–34.0)
MCHC: 30.7 g/dL (ref 30.0–36.0)
MCV: 94.5 fL (ref 80.0–100.0)
Platelets: 276 10*3/uL (ref 150–400)
RBC: 4 MIL/uL — ABNORMAL LOW (ref 4.22–5.81)
RDW: 14.5 % (ref 11.5–15.5)
WBC: 12.3 10*3/uL — ABNORMAL HIGH (ref 4.0–10.5)
nRBC: 0 % (ref 0.0–0.2)

## 2020-06-28 LAB — GLUCOSE, CAPILLARY
Glucose-Capillary: 107 mg/dL — ABNORMAL HIGH (ref 70–99)
Glucose-Capillary: 148 mg/dL — ABNORMAL HIGH (ref 70–99)
Glucose-Capillary: 224 mg/dL — ABNORMAL HIGH (ref 70–99)
Glucose-Capillary: 146 mg/dL — ABNORMAL HIGH (ref 70–99)

## 2020-06-28 LAB — MAGNESIUM: Magnesium: 2.2 mg/dL (ref 1.7–2.4)

## 2020-06-28 NOTE — Plan of Care (Signed)
  Problem: Education: Goal: Knowledge of disease or condition will improve Outcome: Progressing Goal: Knowledge of the prescribed therapeutic regimen will improve Outcome: Progressing Goal: Individualized Educational Video(s) Outcome: Progressing   Problem: Activity: Goal: Ability to tolerate increased activity will improve Outcome: Progressing Goal: Will verbalize the importance of balancing activity with adequate rest periods Outcome: Progressing   Problem: Respiratory: Goal: Ability to maintain a clear airway will improve Outcome: Progressing Goal: Levels of oxygenation will improve Outcome: Progressing Goal: Ability to maintain adequate ventilation will improve Outcome: Progressing   Problem: Activity: Goal: Ability to tolerate increased activity will improve Outcome: Progressing   Problem: Clinical Measurements: Goal: Ability to maintain a body temperature in the normal range will improve Outcome: Progressing   Problem: Respiratory: Goal: Ability to maintain adequate ventilation will improve Outcome: Progressing Goal: Ability to maintain a clear airway will improve Outcome: Progressing   Problem: Education: Goal: Knowledge of General Education information will improve Description: Including pain rating scale, medication(s)/side effects and non-pharmacologic comfort measures Outcome: Progressing   Problem: Health Behavior/Discharge Planning: Goal: Ability to manage health-related needs will improve Outcome: Progressing   Problem: Clinical Measurements: Goal: Ability to maintain clinical measurements within normal limits will improve Outcome: Progressing Goal: Will remain free from infection Outcome: Progressing Goal: Diagnostic test results will improve Outcome: Progressing Goal: Respiratory complications will improve Outcome: Progressing Goal: Cardiovascular complication will be avoided Outcome: Progressing   Problem: Activity: Goal: Risk for activity  intolerance will decrease Outcome: Progressing   Problem: Nutrition: Goal: Adequate nutrition will be maintained Outcome: Progressing   Problem: Coping: Goal: Level of anxiety will decrease Outcome: Progressing   Problem: Elimination: Goal: Will not experience complications related to bowel motility Outcome: Progressing Goal: Will not experience complications related to urinary retention Outcome: Progressing   Problem: Pain Managment: Goal: General experience of comfort will improve Outcome: Progressing   Problem: Safety: Goal: Ability to remain free from injury will improve Outcome: Progressing   Problem: Skin Integrity: Goal: Risk for impaired skin integrity will decrease Outcome: Progressing   

## 2020-06-28 NOTE — Progress Notes (Signed)
PROGRESS NOTE    James [name]  [id number]:[id number] DOB: 01-31-1960 DOA: 06/26/2020 PCP: [name], MD  [phone]   Assessment & Plan:   Principal Problem:   CAP (community acquired pneumonia) Active Problems:   COPD mixed type ([place])   History of tobacco abuse   Hypertension   History of alcohol use   Chronic respiratory failure with hypoxia ([place])   Type 2 diabetes mellitus without complication, without long-term current use of insulin (HCC)   Obesity (BMI 30.0-34.9)    James [name] is a 60 y.o. male with medical history significant of COPD on 2-4L home O2; ETOH dependence; DM; and HTN presenting with SOB.     CAP Recurrent PNA -Patient presenting with productive cough, acute onset of SOB, and infiltrate in left lower lobe on chest x-ray --per pt, he had just finished treatment for PNA about 2 months ago. -Influenza negative. -COVID-19 negative. --procal <0.1 -started on ceftriaxone and azithro on admission Plan: --cont ceftriaxone and azithro  Acute on chronic respiratory failure 2/2 CAP and COPD exacerbation -He has history of O2-dependent COPD but had increased O2 requirement overnight, transitioned by EMS to NRB and on 4L at the time of admission. --Pt is steroid-dependent, and on 10 mg daily chronically (unable to be weaned off).  Sees pulmonology outpatient --started on IV solumedrol on admission. --already down to 2L today Plan: --cont steroid as prednisone with taper --cont DuoNeb and home bronchodilators --treat CAP  HTN --cont losartan   DM, well controlled Hyperglycemia due to steroid -A1c 6.5 --SSI while inpatient   ETOH dependence, in remission -No ETOH in about 1 month, praise provided -Ongoing efforts at sustained remission are encouraged   Obesity -Body mass index is 30.13 kg/m.. -Weight loss should be encouraged -Outpatient PCP/bariatric medicine f/u encouraged    DVT prophylaxis: Lovenox SQ Code Status: Full code  Family  Communication:  Level of care: Med-Surg Dispo:   The patient is from: home Anticipated d/c is to: home Anticipated d/c date is: likely tomorrow Patient currently is not medically ready to d/c due to: IV abx for PNA   Subjective and Interval History:  Pt reported breathing better.  Ate well.   Objective: Vitals:   06/28/20 0438 06/28/20 0750 06/28/20 1349 06/28/20 1413  BP: 135/74   127/75  Pulse: 82   88  Resp: 18   18  Temp: 98.3 F (36.8 C)   98 F (36.7 C)  TempSrc: Oral     SpO2: 100% 98% 99% 97%  Weight:      Height:        Intake/Output Summary (Last 24 hours) at 06/28/2020 1529 Last data filed at 06/27/2020 1800 Gross per 24 hour  Intake 350 ml  Output --  Net 350 ml   Filed Weights   06/26/20 0431  Weight: 92.5 kg    Examination:   Constitutional: NAD, AAOx3 HEENT: conjunctivae and lids normal, EOMI CV: No cyanosis.   RESP: normal respiratory effort, on 2L Extremities: No effusions, edema in BLE SKIN: warm, dry Neuro: II - XII grossly intact.   Psych: Normal mood and affect.  Appropriate judgement and reason   Data Reviewed: I have personally reviewed following labs and imaging studies  CBC: Recent Labs  Lab 06/26/20 0432 06/27/20 0044 06/28/20 0249  WBC 13.4* 12.4* 12.3*  NEUTROABS 8.9*  --   --   HGB 12.8* 12.1* 11.6*  HCT 41.9 38.6* 37.8*  MCV 94.6 93.5 94.5  PLT 300 296 276  Basic Metabolic Panel: Recent Labs  Lab 06/26/20 0432 06/27/20 0044 06/28/20 0249  NA 140 138 142  K 4.3 4.6 4.4  CL 102 100 108  CO2 31 30 30   GLUCOSE 110* 166* 89  BUN 16 18 18   CREATININE 1.05 0.91 0.79  CALCIUM 9.0 9.2 9.1  MG  --   --  2.2   GFR: Estimated Creatinine Clearance: 111.7 mL/min (by C-G formula based on SCr of 0.79 mg/dL). Liver Function Tests: Recent Labs  Lab 06/26/20 0432  AST 27  ALT 25  ALKPHOS 33*  BILITOT 0.4  PROT 5.9*  ALBUMIN 3.8   No results for input(s): LIPASE, AMYLASE in the last 168 hours. No results for  input(s): AMMONIA in the last 168 hours. Coagulation Profile: No results for input(s): INR, PROTIME in the last 168 hours. Cardiac Enzymes: No results for input(s): CKTOTAL, CKMB, CKMBINDEX, TROPONINI in the last 168 hours. BNP (last 3 results) No results for input(s): PROBNP in the last 8760 hours. HbA1C: Recent Labs    06/26/20 1840  HGBA1C 6.5*   CBG: Recent Labs  Lab 06/27/20 1216 06/27/20 1703 06/27/20 2105 06/28/20 0800 06/28/20 1203  GLUCAP 231* 151* 181* 107* 148*   Lipid Profile: No results for input(s): CHOL, HDL, LDLCALC, TRIG, CHOLHDL, LDLDIRECT in the last 72 hours. Thyroid Function Tests: No results for input(s): TSH, T4TOTAL, FREET4, T3FREE, THYROIDAB in the last 72 hours. Anemia Panel: No results for input(s): VITAMINB12, FOLATE, FERRITIN, TIBC, IRON, RETICCTPCT in the last 72 hours. Sepsis Labs: Recent Labs  Lab 06/26/20 1840  PROCALCITON <0.10    Recent Results (from the past 240 hour(s))  Resp Panel by RT-PCR (Flu A&B, Covid) Nasopharyngeal Swab     Status: None   Collection Time: 06/26/20  6:13 AM   Specimen: Nasopharyngeal Swab; Nasopharyngeal(NP) swabs in vial transport medium  Result Value Ref Range Status   SARS Coronavirus 2 by RT PCR NEGATIVE NEGATIVE Final    Comment: (NOTE) SARS-CoV-2 target nucleic acids are NOT DETECTED.  The SARS-CoV-2 RNA is generally detectable in upper respiratory specimens during the acute phase of infection. The lowest concentration of SARS-CoV-2 viral copies this assay can detect is 138 copies/mL. A negative result does not preclude SARS-Cov-2 infection and should not be used as the sole basis for treatment or other patient management decisions. A negative result may occur with  improper specimen collection/handling, submission of specimen other than nasopharyngeal swab, presence of viral mutation(s) within the areas targeted by this assay, and inadequate number of viral copies(<138 copies/mL). A negative  result must be combined with clinical observations, patient history, and epidemiological information. The expected result is Negative.  Fact Sheet for Patients:  EntrepreneurPulse.com.au  Fact Sheet for Healthcare Providers:  IncredibleEmployment.be  This test is no t yet approved or cleared by the [place] FDA and  has been authorized for detection and/or diagnosis of SARS-CoV-2 by FDA under an Emergency Use Authorization (EUA). This EUA will remain  in effect (meaning this test can be used) for the duration of the COVID-19 declaration under Section 564(b)(1) of the Act, 21 U.S.C.section 360bbb-3(b)(1), unless the authorization is terminated  or revoked sooner.       Influenza A by PCR NEGATIVE NEGATIVE Final   Influenza B by PCR NEGATIVE NEGATIVE Final    Comment: (NOTE) The Xpert Xpress SARS-CoV-2/FLU/RSV plus assay is intended as an aid in the diagnosis of influenza from Nasopharyngeal swab specimens and should not be used as a sole basis for treatment.  Nasal washings and aspirates are unacceptable for Xpert Xpress SARS-CoV-2/FLU/RSV testing.  Fact Sheet for Patients: EntrepreneurPulse.com.au  Fact Sheet for Healthcare Providers: IncredibleEmployment.be  This test is not yet approved or cleared by the [place] FDA and has been authorized for detection and/or diagnosis of SARS-CoV-2 by FDA under an Emergency Use Authorization (EUA). This EUA will remain in effect (meaning this test can be used) for the duration of the COVID-19 declaration under Section 564(b)(1) of the Act, 21 U.S.C. section 360bbb-3(b)(1), unless the authorization is terminated or revoked.  Performed at [facility], [place] [address], [place], [place] [address]       Radiology Studies: No results found.   Scheduled Meds:  docusate sodium  100 mg Oral BID   enoxaparin (LOVENOX) injection  40 mg Subcutaneous  Q24H   insulin aspart  0-15 Units Subcutaneous TID WC   insulin aspart  0-5 Units Subcutaneous QHS   ipratropium-albuterol  3 mL Nebulization TID   losartan  100 mg Oral Daily   metoprolol succinate  25 mg Oral Daily   mometasone-formoterol  2 puff Inhalation BID   nicotine  14 mg Transdermal Daily   predniSONE  40 mg Oral Q breakfast   sodium chloride flush  3 mL Intravenous Q12H   Continuous Infusions:  azithromycin 500 mg (06/28/20 1230)   cefTRIAXone (ROCEPHIN)  IV 2 g (06/28/20 0935)     LOS: 2 days     [name], MD [facility] If 7PM-7AM, please contact night-coverage 06/28/2020, 3:29 PM

## 2020-06-29 LAB — BASIC METABOLIC PANEL
Anion gap: 7 (ref 5–15)
BUN: 18 mg/dL (ref 6–20)
CO2: 31 mmol/L (ref 22–32)
Calcium: 9.4 mg/dL (ref 8.9–10.3)
Chloride: 104 mmol/L (ref 98–111)
Creatinine, Ser: 0.79 mg/dL (ref 0.61–1.24)
GFR, Estimated: >60 mL/min (ref >60)
Glucose, Bld: 127 mg/dL — ABNORMAL HIGH (ref 70–99)
Potassium: 4.3 mmol/L (ref 3.5–5.1)
Sodium: 142 mmol/L (ref 135–145)

## 2020-06-29 LAB — GLUCOSE, CAPILLARY
Glucose-Capillary: 92 mg/dL (ref 70–99)
Glucose-Capillary: 169 mg/dL — ABNORMAL HIGH (ref 70–99)

## 2020-06-29 LAB — CBC
HCT: 39.5 % (ref 39.0–52.0)
Hemoglobin: 12.3 g/dL — ABNORMAL LOW (ref 13.0–17.0)
MCH: 29 pg (ref 26.0–34.0)
MCHC: 31.1 g/dL (ref 30.0–36.0)
MCV: 93.2 fL (ref 80.0–100.0)
Platelets: 292 10*3/uL (ref 150–400)
RBC: 4.24 MIL/uL (ref 4.22–5.81)
RDW: 14.4 % (ref 11.5–15.5)
WBC: 12.8 10*3/uL — ABNORMAL HIGH (ref 4.0–10.5)
nRBC: 0 % (ref 0.0–0.2)

## 2020-06-29 LAB — MAGNESIUM: Magnesium: 2.3 mg/dL (ref 1.7–2.4)

## 2020-06-29 MED ORDER — LEVOFLOXACIN 500 MG PO TABS
500.0000 mg | ORAL_TABLET | Freq: Every day | ORAL | 0 refills | Status: AC
  Filled 2020-06-29: qty 1, 1d supply, fill #0

## 2020-06-29 MED ORDER — PREDNISONE 10 MG PO TABS: 10.0000 mg | ORAL_TABLET | Freq: Every day | ORAL | 3 refills | Status: DC

## 2020-06-29 MED ORDER — PREDNISONE 20 MG PO TABS
ORAL_TABLET | ORAL | 0 refills | Status: DC
  Filled 2020-06-29: qty 6, 8d supply, fill #0

## 2020-06-29 MED ORDER — GUAIFENESIN ER 600 MG PO TB12: 600.0000 mg | ORAL_TABLET | Freq: Two times a day (BID) | ORAL | Status: DC | PRN

## 2020-06-29 NOTE — Discharge Summary (Signed)
Physician Discharge Summary   James [name]  male DOB: November 24, 1960  IWL:[id number]  PCP: [name], MD  Admit date: 06/26/2020 Discharge date: 06/29/2020  Admitted From: home Disposition:  home CODE STATUS: Full code  Discharge Instructions     Discharge instructions   Complete by: As directed    You have received 4 days of IV antibiotics for your presumed pneumonia.  Please finish 1 more day at home with Levaquin on 6/27.  You have been prescribed prednisone taper.  Please take as directed.   Dr. [name] [facility] Course:  For full details, please see H&P, progress notes, consult notes and ancillary notes.  Briefly,  James [name] is a 60 y.o. male with medical history significant of COPD on 2-4L home O2; ETOH dependence; DM; and HTN presenting with SOB.     CAP Recurrent PNA -Patient presenting with productive cough, acute onset of SOB, and infiltrate in left lower lobe on chest x-ray --per pt, he had just finished treatment for PNA about 2 months ago. -Influenza negative. -COVID-19 negative. --procal <0.1 -started on ceftriaxone and azithro on admission, which pt received 4 days and was discharged on 1 more day of Levaquin.   Acute on chronic respiratory failure 2/2 CAP and COPD exacerbation EMS applied NRB and on 4L at the time of admission. --Pt is steroid-dependent, and on 10 mg daily chronically (unable to be weaned off).  Sees pulmonology outpatient --started on IV solumedrol on admission.   --received DuoNeb and home bronchodilators --discharged on prednisone taper.   HTN --cont losartan and metop   DM, well controlled Hyperglycemia due to steroid -A1c 6.5 --SSI while inpatient Not taking hypoglycemics at home currently.   ETOH dependence, in remission -No ETOH in about 1 month, praise provided -Ongoing efforts at sustained remission are encouraged   Obesity -Body mass index is 30.13 kg/m.   Discharge Diagnoses:   Principal Problem:   CAP (community acquired pneumonia) Active Problems:   COPD mixed type ([place])   History of tobacco abuse   Hypertension   History of alcohol use   Chronic respiratory failure with hypoxia ([place])   Type 2 diabetes mellitus without complication, without long-term current use of insulin (HCC)   Obesity (BMI 30.0-34.9)   30 Day Unplanned Readmission Risk Score    Flowsheet Row ED to Hosp-Admission (Current) from 06/26/2020 in [place] [facility]  30 Day Unplanned Readmission Risk Score (%) 21.53 Filed at 06/29/2020 0801       This score is the patient's risk of an unplanned readmission within 30 days of being discharged (0 -100%). The score is based on dignosis, age, lab data, medications, orders, and past utilization.   Low:  0-14.9   Medium: 15-21.9   High: 22-29.9   Extreme: 30 and above          Discharge Instructions:  Allergies as of 06/29/2020       Reactions   Gabapentin Other (See Comments)   hallucinations        Medication List     STOP taking these medications    Accu-Chek Guide Me w/Device Kit   Accu-Chek Softclix Lancets lancets   metFORMIN 1000 MG tablet Commonly known as: GLUCOPHAGE   multivitamin with minerals Tabs tablet   True Metrix Blood Glucose Test test strip Generic drug: glucose blood       TAKE these medications    albuterol 108 (  90 Base) MCG/ACT inhaler Commonly known as: VENTOLIN HFA Inhale 2 puffs into the lungs every 4 (four) hours as needed for wheezing or shortness of breath.   albuterol (2.5 MG/3ML) 0.083% nebulizer solution Commonly known as: PROVENTIL Take 3 mLs (2.5 mg total) by nebulization every 6 (six) hours as needed for wheezing or shortness of breath.   Flutter Devi Use 4 times daily What changed:  how much to take how to take this when to take this   furosemide 20 MG tablet Commonly known as: LASIX Take 1 tablet (20 mg total) by mouth daily as needed for edema.    guaiFENesin 600 MG 12 hr tablet Commonly known as: MUCINEX Take 1 tablet (600 mg total) by mouth 2 (two) times daily as needed for cough or to loosen phlegm.   levofloxacin 500 MG tablet Commonly known as: Levaquin Take 1 tablet (500 mg total) by mouth daily for 1 day. Start taking on: June 30, 2020   losartan 100 MG tablet Commonly known as: COZAAR Take 1 tablet (100 mg total) by mouth daily.   metoprolol succinate 25 MG 24 hr tablet Commonly known as: TOPROL-XL Take 1 tablet (25 mg total) by mouth daily.   predniSONE 10 MG tablet Commonly known as: DELTASONE Take 1 tablet (10 mg total) by mouth daily with breakfast. What changed: how much to take   predniSONE 20 MG tablet Commonly known as: DELTASONE Take 40 mg (2 tablets) on 6/27, then 20 mg (1 tablet) from 6/28 to 7/1, then back to your home dose of 10 mg daily. What changed: You were already taking a medication with the same name, and this prescription was added. Make sure you understand how and when to take each.   Spiriva Respimat 2.5 MCG/ACT Aers Generic drug: Tiotropium Bromide Monohydrate INHALE 2 PUFFS INTO THE LUNGS DAILY.   Symbicort 160-4.5 MCG/ACT inhaler Generic drug: budesonide-formoterol INHALE 2 PUFFS INTO THE LUNGS 2 (TWO) TIMES DAILY.         Follow-up Information     [name], MD Follow up in 1 week(s).   Specialty: Pulmonary Disease Contact information: [address] [address] [phone]         O'Neal, Cassie Freer, MD .   Specialties: Cardiology, Internal Medicine, Radiology Contact information: [place] [address] [phone]                 Allergies  Allergen Reactions   Gabapentin Other (See Comments)    hallucinations     The results of significant diagnostics from this hospitalization (including imaging, microbiology, ancillary and laboratory) are listed below for reference.    Consultations:   Procedures/Studies: DG Chest Portable 1 View  Result Date: 06/26/2020 CLINICAL DATA:  shortness of breath, patient arrives on NRB, reports he woke up out of his sleep, hx of COPD, reports feeling some abdominal distention. EXAM: PORTABLE CHEST 1 VIEW COMPARISON:  Chest x-ray 04/21/2020, CT chest 04/20/2020. FINDINGS: The heart size and mediastinal contours are unchanged. Aortic calcification. Interval development of left lower lung zone patchy airspace opacity. No pulmonary edema. No pleural effusion. No pneumothorax. No acute osseous abnormality.  Old healed right rib fractures. IMPRESSION: Interval development of left lower lung zone infection/inflammation. Followup PA and lateral chest X-ray is recommended in 3-4 weeks following therapy to ensure resolution and exclude underlying malignancy. Electronically Signed   By: Iven Finn M.D.   On: 06/26/2020 05:09      Labs: BNP (last  3 results) Recent Labs    10/19/19 0235 03/26/20 0441 06/26/20 0432  BNP 123.3* 101.5* 05.3   Basic Metabolic Panel: Recent Labs  Lab 06/26/20 0432 06/27/20 0044 06/28/20 0249 06/29/20 0205  NA 140 138 142 142  K 4.3 4.6 4.4 4.3  CL 102 100 108 104  CO2 31 30 30 31   GLUCOSE 110* 166* 89 127*  BUN 16 18 18 18   CREATININE 1.05 0.91 0.79 0.79  CALCIUM 9.0 9.2 9.1 9.4  MG  --   --  2.2 2.3   Liver Function Tests: Recent Labs  Lab 06/26/20 0432  AST 27  ALT 25  ALKPHOS 33*  BILITOT 0.4  PROT 5.9*  ALBUMIN 3.8   No results for input(s): LIPASE, AMYLASE in the last 168 hours. No results for input(s): AMMONIA in the last 168 hours. CBC: Recent Labs  Lab 06/26/20 0432 06/27/20 0044 06/28/20 0249 06/29/20 0205  WBC 13.4* 12.4* 12.3* 12.8*  NEUTROABS 8.9*  --   --   --   HGB 12.8* 12.1* 11.6* 12.3*  HCT 41.9 38.6* 37.8* 39.5  MCV 94.6 93.5 94.5 93.2  PLT 300 296 276 292   Cardiac Enzymes: No results for input(s): CKTOTAL, CKMB, CKMBINDEX, TROPONINI in the last  168 hours. BNP: Invalid input(s): POCBNP CBG: Recent Labs  Lab 06/28/20 0800 06/28/20 1203 06/28/20 1600 06/28/20 2054 06/29/20 0747  GLUCAP 107* 148* 224* 146* 92   D-Dimer No results for input(s): DDIMER in the last 72 hours. Hgb A1c Recent Labs    06/26/20 1840  HGBA1C 6.5*   Lipid Profile No results for input(s): CHOL, HDL, LDLCALC, TRIG, CHOLHDL, LDLDIRECT in the last 72 hours. Thyroid function studies No results for input(s): TSH, T4TOTAL, T3FREE, THYROIDAB in the last 72 hours.  Invalid input(s): FREET3 Anemia work up No results for input(s): VITAMINB12, FOLATE, FERRITIN, TIBC, IRON, RETICCTPCT in the last 72 hours. Urinalysis    Component Value Date/Time   COLORURINE STRAW (A) 04/19/2020 1150   APPEARANCEUR CLEAR 04/19/2020 1150   LABSPEC 1.006 04/19/2020 1150   PHURINE 7.0 04/19/2020 1150   GLUCOSEU 150 (A) 04/19/2020 1150   HGBUR NEGATIVE 04/19/2020 1150   BILIRUBINUR NEGATIVE 04/19/2020 1150   BILIRUBINUR negative 09/25/2018 1432   KETONESUR NEGATIVE 04/19/2020 1150   PROTEINUR NEGATIVE 04/19/2020 1150   UROBILINOGEN 0.2 02/19/2019 1029   NITRITE NEGATIVE 04/19/2020 1150   LEUKOCYTESUR NEGATIVE 04/19/2020 1150   Sepsis Labs Invalid input(s): PROCALCITONIN,  WBC,  LACTICIDVEN Microbiology Recent Results (from the past 240 hour(s))  Resp Panel by RT-PCR (Flu A&B, Covid) Nasopharyngeal Swab     Status: None   Collection Time: 06/26/20  6:13 AM   Specimen: Nasopharyngeal Swab; Nasopharyngeal(NP) swabs in vial transport medium  Result Value Ref Range Status   SARS Coronavirus 2 by RT PCR NEGATIVE NEGATIVE Final    Comment: (NOTE) SARS-CoV-2 target nucleic acids are NOT DETECTED.  The SARS-CoV-2 RNA is generally detectable in upper respiratory specimens during the acute phase of infection. The lowest concentration of SARS-CoV-2 viral copies this assay can detect is 138 copies/mL. A negative result does not preclude SARS-Cov-2 infection and should not  be used as the sole basis for treatment or other patient management decisions. A negative result may occur with  improper specimen collection/handling, submission of specimen other than nasopharyngeal swab, presence of viral mutation(s) within the areas targeted by this assay, and inadequate number of viral copies(<138 copies/mL). A negative result must be combined with clinical observations, patient history, and epidemiological  information. The expected result is Negative.  Fact Sheet for Patients:  EntrepreneurPulse.com.au  Fact Sheet for Healthcare Providers:  IncredibleEmployment.be  This test is no t yet approved or cleared by the [place] FDA and  has been authorized for detection and/or diagnosis of SARS-CoV-2 by FDA under an Emergency Use Authorization (EUA). This EUA will remain  in effect (meaning this test can be used) for the duration of the COVID-19 declaration under Section 564(b)(1) of the Act, 21 U.S.C.section 360bbb-3(b)(1), unless the authorization is terminated  or revoked sooner.       Influenza A by PCR NEGATIVE NEGATIVE Final   Influenza B by PCR NEGATIVE NEGATIVE Final    Comment: (NOTE) The Xpert Xpress SARS-CoV-2/FLU/RSV plus assay is intended as an aid in the diagnosis of influenza from Nasopharyngeal swab specimens and should not be used as a sole basis for treatment. Nasal washings and aspirates are unacceptable for Xpert Xpress SARS-CoV-2/FLU/RSV testing.  Fact Sheet for Patients: EntrepreneurPulse.com.au  Fact Sheet for Healthcare Providers: IncredibleEmployment.be  This test is not yet approved or cleared by the [place] FDA and has been authorized for detection and/or diagnosis of SARS-CoV-2 by FDA under an Emergency Use Authorization (EUA). This EUA will remain in effect (meaning this test can be used) for the duration of the COVID-19 declaration under Section  564(b)(1) of the Act, 21 U.S.C. section 360bbb-3(b)(1), unless the authorization is terminated or revoked.  Performed at [facility], [place] [address], [place], [place] [address]      Total time spend on discharging this patient, including the last patient exam, discussing the hospital stay, instructions for ongoing care as it relates to all pertinent caregivers, as well as preparing the medical discharge records, prescriptions, and/or referrals as applicable, is 30 minutes.    [name], MD  [facility] 06/29/2020, 8:46 AM

## 2020-06-30 ENCOUNTER — Telehealth: Payer: Self-pay | Admitting: *Deleted

## 2020-06-30 ENCOUNTER — Other Ambulatory Visit: Payer: Self-pay

## 2020-06-30 NOTE — Telephone Encounter (Signed)
Transition Care Management Follow-up Telephone Call Date of discharge and from where: 06/29/2020 - [facility] How have you been since you were released from the hospital? "Okay I guess" Any questions or concerns? No  Items Reviewed: Did the pt receive and understand the discharge instructions provided? Yes  Medications obtained and verified? Yes  Other? No  Any new allergies since your discharge? No  Dietary orders reviewed? No Do you have support at home? Yes    Functional Questionnaire: (I = Independent and D = Dependent) ADLs: I  Bathing/Dressing- I  Meal Prep- I  Eating- I  Maintaining continence- I  Transferring/Ambulation- I  Managing Meds- I  Follow up appointments reviewed:  PCP Hospital f/u appt confirmed? No  [facility]   Are transportation arrangements needed? No  If their condition worsens, is the pt aware to call PCP or go to the Emergency Dept.? Yes Was the patient provided with contact information for the PCP's office or ED? Yes Was to pt encouraged to call back with questions or concerns? Yes

## 2020-07-08 ENCOUNTER — Other Ambulatory Visit: Payer: Self-pay | Admitting: Hospitalist

## 2020-07-08 ENCOUNTER — Other Ambulatory Visit: Payer: Self-pay

## 2020-07-09 ENCOUNTER — Other Ambulatory Visit: Payer: Medicaid Other | Admitting: Hospice

## 2020-07-09 ENCOUNTER — Other Ambulatory Visit: Payer: Self-pay

## 2020-07-09 ENCOUNTER — Telehealth: Payer: Self-pay | Admitting: Critical Care Medicine

## 2020-07-09 DIAGNOSIS — Z515 Encounter for palliative care: Secondary | ICD-10-CM

## 2020-07-09 DIAGNOSIS — I10 Essential (primary) hypertension: Secondary | ICD-10-CM

## 2020-07-09 DIAGNOSIS — J441 Chronic obstructive pulmonary disease with (acute) exacerbation: Secondary | ICD-10-CM | POA: Diagnosis not present

## 2020-07-09 DIAGNOSIS — Z87898 Personal history of other specified conditions: Secondary | ICD-10-CM

## 2020-07-09 MED ORDER — PREDNISONE 10 MG PO TABS: 10.0000 mg | ORAL_TABLET | Freq: Every day | ORAL | 3 refills | Status: DC

## 2020-07-09 MED FILL — Albuterol Sulfate Soln Nebu 0.083% (2.5 MG/3ML): RESPIRATORY_TRACT | 30 days supply | Qty: 270 | Fill #0 | Status: AC

## 2020-07-09 NOTE — Telephone Encounter (Signed)
Will forward to provider  

## 2020-07-09 NOTE — Telephone Encounter (Signed)
Requested medication (s) are due for refill today: see previous encounter  Requested medication (s) are on the active medication list: yes  Last refill:  06/29/20 #60 3 refills  Future visit scheduled: yes in 3 weeks  Notes to clinic:  not delegated per protocol.patient reports he is out of 10 mg prednisone. Do you want to refill?      Requested Prescriptions  Pending Prescriptions Disp Refills   predniSONE (DELTASONE) 10 MG tablet 60 tablet 3    Sig: Take 1 tablet (10 mg total) by mouth daily with breakfast.      Not Delegated - Endocrinology:  Oral Corticosteroids Failed - 07/09/2020  4:38 PM      Failed - This refill cannot be delegated      Failed - Last BP in normal range    BP Readings from Last 1 Encounters:  06/29/20 (!) 149/84          Passed - Valid encounter within last 6 months    Recent Outpatient Visits           4 weeks ago Type 2 diabetes mellitus without complication, without long-term current use of insulin ([place])   [place] [name], MD   1 month ago Essential hypertension   [place], Patrick E, MD   2 months ago COPD with acute exacerbation [facility])   [place] [name], MD   4 months ago Type 2 diabetes mellitus without complication, without long-term current use of insulin [facility])   [place] [name], MD   7 months ago Bronchiectasis without complication [facility])   [place], MD       Future Appointments             In 3 weeks [name], MD [place]   In 1 month [name], MD [place]

## 2020-07-09 NOTE — Progress Notes (Signed)
[employer] [facility] Palliative Care Consult Note Telephone: [phone]    PATIENT NAME: GIANCARLOS BERENDT DOB: 07/07/1960 MRN: [id number]  PRIMARY CARE PROVIDER:   [name], MD [name], MD [address],  [place] [address]  REFERRING PROVIDER: [name], MD [name], MD [address],  [place] [address]  RESPONSIBLE PARTY:  [place]     Name Relation Home Work [place] Sister   [phone]   [name] [phone]     Domingos, Riggi Sister   [phone]     TELEHEALTH VISIT STATEMENT Due to the COVID-19 crisis, this visit was done via telemedicine and it was initiated and consent by this patient and or family. Video-audio (telehealth) contact was unable to be done due to technical barriers from the patient's side.   Visit is to build trust and highlight Palliative Medicine as specialized medical care for people living with serious illness, aimed at facilitating better quality of life through symptoms relief, assisting with advance care planning and complex medical decision making. This is a follow up visit.  RECOMMENDATIONS/PLAN:   Advance Care Planning/Code Status: Patient is a DO NOT RESUSCITATE  Goals of Care: Goals of care include to maximize quality of life and symptom management.  Visit consisted of counseling and education dealing with the complex and emotionally intense issues of symptom management and palliative care in the setting of serious and potentially life-threatening illness. Palliative care team will continue to support patient, patient's family, and medical team.  Symptom management/Plan:  COPD: exacerbation Improved, continue prednisone, oxygen supplementation, flutter valve therapy and breathing treatments as ordered-albuterol, Symbicort and Spiriva.  NP encouraged cool environment, use of fan for air  circulation.  Avoid irritants and triggers.  Followed by pulmonologist. Pneumonia: Completed antibiotics.  Resolved Alcohol dependence: No alcohol in 1 month.  Validation provided.  Encouraged to continue abstinence from alcohol. Discussed tobacco chewing cessation Type 2 diabetes mellitus: He continues with metformin with no report of adverse effects.  Last A1c 6.7 03/26/2020. Hypertension continues to be managed with metoprolol and losartan.  Follow up: Palliative care will continue to follow for complex medical decision making, advance care planning, and clarification of goals.  Inpatient visit in 4 weeks or prn. Encouraged to call provider sooner with any concerns.  CHIEF COMPLAINT: Palliative follow up  HISTORY OF PRESENT ILLNESS:  ELIAS DENNINGTON a 60 y.o. male with multiple medical problems including COPD exacerbation and pneumonia or which he was hospitalized 6/23-6/26/2022.  He was treated with IV antibiotics for pneumonia, IV Solu-Medrol ,DuoNeb, and discharged home with prednisone taper and his breathing treatments.  Patient reports he is breathing a lot better today, awaiting to go to the pharmacy to pick some of his breathing treatments.  NP called [name] to ensure that patient has his medications from the pharmacy; she was agreeable and will check in to make sure.  History of hypertension, type 2 diabetes mellitus, tobacco use, alcohol use.  History obtained from review of EMR, discussion with primary team, family and/or patient. Records reviewed and summarized above. All 10 point systems reviewed and are negative except as documented in history of present illness above  Review and summarization of Epic records shows history from other than patient.   Palliative Care was asked to follow this patient o help address complex decision making in the context of advance care planning and goals of  care clarification.   PERTINENT MEDICATIONS:  Outpatient Encounter Medications as of 07/09/2020   Medication Sig   albuterol (PROVENTIL) (2.5 MG/3ML) 0.083% nebulizer solution Take 3 mLs (2.5 mg total) by nebulization every 6 (six) hours as needed for wheezing or shortness of breath.   albuterol (VENTOLIN HFA) 108 (90 Base) MCG/ACT inhaler Inhale 2 puffs into the lungs every 4 (four) hours as needed for wheezing or shortness of breath.   budesonide-formoterol (SYMBICORT) 160-4.5 MCG/ACT inhaler INHALE 2 PUFFS INTO THE LUNGS 2 (TWO) TIMES DAILY.   furosemide (LASIX) 20 MG tablet Take 1 tablet (20 mg total) by mouth daily as needed for edema.   guaiFENesin (MUCINEX) 600 MG 12 hr tablet Take 1 tablet (600 mg total) by mouth 2 (two) times daily as needed for cough or to loosen phlegm.   losartan (COZAAR) 100 MG tablet Take 1 tablet (100 mg total) by mouth daily.   metoprolol succinate (TOPROL-XL) 25 MG 24 hr tablet Take 1 tablet (25 mg total) by mouth daily.   predniSONE (DELTASONE) 10 MG tablet Take 1 tablet (10 mg total) by mouth daily with breakfast.   predniSONE (DELTASONE) 20 MG tablet Take 40 mg (2 tablets) on 6/27, then 20 mg (1 tablet) from 6/28 to 7/1, then back to your home dose of 10 mg daily.   Respiratory Therapy Supplies (FLUTTER) DEVI Use 4 times daily   Tiotropium Bromide Monohydrate 2.5 MCG/ACT AERS INHALE 2 PUFFS INTO THE LUNGS DAILY.   [DISCONTINUED] pantoprazole (PROTONIX) 40 MG tablet Take 1 tablet (40 mg total) by mouth daily. (Patient not taking: Reported on 06/26/2020)   No facility-administered encounter medications on file as of 07/09/2020.    HOSPICE ELIGIBILITY/DIAGNOSIS: TBD  PAST MEDICAL HISTORY:  Past Medical History:  Diagnosis Date   Acute on chronic respiratory failure with hypoxia ([place]) 06/08/2013   CAP (community acquired pneumonia) 05/17/2018   See admit 05/17/18 ? rml  with   covid pcr neg - rx augmentin > f/u cxr in 4-6 weeks is fine unless condition declines    Community acquired pneumonia of right lower lobe of lung 05/17/2018   See admit 05/17/18 ? 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 More than 50% of the time in this consultation was spent on counseling and coordinating communication   Thank you for the opportunity to participate in the care of LUM STILLINGER Please call our office at [phone] if we can be of additional assistance.  Note: Portions of this note were generated with Lobbyist. Dictation errors may occur despite best attempts at proofreading.  Teodoro Spray, NP

## 2020-07-09 NOTE — Telephone Encounter (Signed)
Requested medication (s) are due for refill today: no course completed  Requested medication (s) are on the active medication list: yes  Last refill:  06/29/20 #6 0 refills  Future visit scheduled: yes in 3 weeks  Notes to clinic:  completed tapered course on 07/04/20. Patient called to confirm completion of course of prednisone 20 mg and patient confirmed completion. Patient reports he is out of his prednisone 10 mg . Will send refill request     Requested Prescriptions  Pending Prescriptions Disp Refills   predniSONE (DELTASONE) 20 MG tablet 6 tablet 0    Sig: Take 40 mg (2 tablets) on 6/27, then 20 mg (1 tablet) from 6/28 to 7/1, then back to your home dose of 10 mg daily.      Not Delegated - Endocrinology:  Oral Corticosteroids Failed - 07/09/2020  4:12 PM      Failed - This refill cannot be delegated      Failed - Last BP in normal range    BP Readings from Last 1 Encounters:  06/29/20 (!) 149/84          Passed - Valid encounter within last 6 months    Recent Outpatient Visits           4 weeks ago Type 2 diabetes mellitus without complication, without long-term current use of insulin ([place])   [place] [name], MD   1 month ago Essential hypertension   [place] [name], MD   2 months ago COPD with acute exacerbation [facility])   [place] [name], [place]   4 months ago Type 2 diabetes mellitus without complication, without long-term current use of insulin [facility])   [place] [name], MD   7 months ago Bronchiectasis without complication [facility])   [place], [place]       Future Appointments             In 3 weeks [name], MD [place]   In 1 month [name], MD [place]

## 2020-07-10 ENCOUNTER — Other Ambulatory Visit: Payer: Self-pay

## 2020-07-11 ENCOUNTER — Other Ambulatory Visit: Payer: Self-pay

## 2020-07-14 ENCOUNTER — Other Ambulatory Visit: Payer: Self-pay | Admitting: Critical Care Medicine

## 2020-07-14 ENCOUNTER — Other Ambulatory Visit: Payer: Self-pay

## 2020-07-14 ENCOUNTER — Telehealth: Payer: Self-pay

## 2020-07-14 MED ORDER — PREDNISONE 10 MG PO TABS
10.0000 mg | ORAL_TABLET | Freq: Every day | ORAL | 3 refills | Status: DC
  Filled 2020-07-14: qty 30, 30d supply, fill #0

## 2020-07-14 NOTE — Telephone Encounter (Signed)
[name],[name]  checking on the status of predniSONE (DELTASONE) 10 MG tablet. Community pharmacy denies receiving rx. Caller states this medication is extremely important and would like PCP nurse to call her as soon as possible

## 2020-07-14 NOTE — Telephone Encounter (Signed)
Call received from patient's sister, [name]. She was concerned that the patient has no more prednisone. He was prescribed prednisone when he left the hospital but he ran out. She said [name] ordered more on 07/09/2020 but the pharmacy has not been able to fill it.   This CM spoke to [name], [facility] and said that they will fill Dr Bettina Gavia order today.  He was not sure why this prescription was not received by [facility].   This CM informed [name] that the prescription will be filled today and she said that [name] would be picking it up.

## 2020-07-15 ENCOUNTER — Other Ambulatory Visit: Payer: Self-pay

## 2020-07-15 NOTE — Telephone Encounter (Signed)
Rx was sent on 07/09/20

## 2020-07-15 NOTE — Telephone Encounter (Signed)
Pt picked up rx on 07/14/20

## 2020-07-18 ENCOUNTER — Other Ambulatory Visit: Payer: Self-pay

## 2020-07-18 ENCOUNTER — Other Ambulatory Visit: Payer: Self-pay | Admitting: *Deleted

## 2020-07-18 NOTE — Patient Instructions (Signed)
Visit Information  Mr. Bose was given information about Medicaid Managed Care team care coordination services as a part of their Healthy [facility] Medicaid benefit. [name] verbally consented to engagement with the [facility] Managed Care team.   For questions related to your Healthy [facility] health plan, please call: [phone] or visit the homepage here: [email or web address]  If you would like to schedule transportation through your Healthy [facility] plan, please call the following number at least 2 days in advance of your appointment: [phone]  Call the [place] at [phone], at any time, 24 hours a day, 7 days a week. If you are in danger or need immediate medical attention call 911.  If you would like help to quit smoking, call 1-800-QUIT-NOW 367-779-2418) OR Espaol: 1-855-Djelo-Ya (4-210-312-8118) o para ms informacin haga clic aqu or Text READY to 200-400 to register via text  Mr. Balke - following are the goals we discussed in your visit today:   Goals Addressed             This Visit's Progress    Stop or Cut Down Drinking Alcohol       Timeframe:  Long-Range Goal Priority:  High Start Date:   04/25/20                          Expected End Date:  08/25/20                     Follow Up Date 07/21/20   - avoid people and places where alcohol is used - develop a plan to avoid relapse  - think about counseling to assist in [place] - work with [name]/[name] for therapy options   Why is this important?   To stop drinking it is important to have support from a person or group of people who you can count on.  You will also need to think about the things that make you feel like using alcohol; then plan for how to handle them.         Track and Manage My Triggers-COPD       Timeframe:  Long-Range Goal Priority:  High Start Date:  04/09/20                           Expected End Date:   08/18/20                     Follow Up Date 07/21/20   - avoid outdoors during the warmest part of the day - avoid second hand smoke - identify and remove indoor air pollutants - limit outdoor activity during cold weather - listen for public air quality announcements every day  - take prescribed medications as directed - call PCP with new concerns or questions - take all medications as directed   Why is this important?   Triggers are activities or things, like tobacco smoke or cold weather, that make your COPD (chronic obstructive pulmonary disease) flare-up.  Knowing these triggers helps you plan how to stay away from them.  When you cannot remove them, you can learn how to manage them.             Please see education materials related to COPD provided by MyChart link.  Patient verbalizes understanding of instructions provided today.   Telephone follow up appointment with Managed Medicaid care management team member scheduled for:07/21/20 @  10am  [name] RN, BSN Tom Green RN Care Coordinator   Following is a copy of your plan of care:  Patient Care Plan: COPD (Adult)     Problem Identified: Symptom Exacerbation (COPD)      Long-Range Goal: Symptom Exacerbation Prevented or Minimized   Start Date: 04/09/2020  Expected End Date: 08/18/2020  Recent Progress: On track  Priority: High  Note:   Current Barriers:  Knowledge deficits related to basic COPD self care/management-Mr. Hair was recently admitted for COPD exacerbation and pnuemonia. He reports taking his medications, wearing oxygen continuously, and has a follow up with his PCP on 04/14/20. He does report having difficulty at times swallowing food and liquid, pain in back and legs, and difficulty falling asleep. He is planning to discuss these issues with his PCP. Mr. Hoeffner reports having transportation and plenty of food. He lives alone, and has support from his sister. Mr. Milhorn attended his  follow up with PCP. He was working outside at his home today and reports doing "about the same". He reports increased difficulty breathing when it is hot outside. He has his medications and oxygen, reports no needs at this time. He is working with [facility] pharmacist for medication management.Update-Today Mr. Ofarrell reports his breathing is about the same, worse with the warm temperatures. He needs two medications refilled and request RNCM to call him on Monday at 10am to assist in requesting refills. He is aware of his PCP appointment on 7/28 and has transportation.  He has not been drinking alcohol and reports feeling better since quitting. Does not contact provider office for questions/concerns  Case Manager Clinical Goal(s): patient will report using inhalers as prescribed including rinsing mouth after use patient will engage in lite exercise as tolerated to build/regain stamina and strength and reduce shortness of breath through activity tolerance patient will verbalize basic understanding of COPD disease process and self care activities patient will not be hospitalized for COPD exacerbation as evidenced  Patient will work with [facility] pharmacist for medication management-[facility] Patient will attend all scheduled appointments Interventions:  Inter-disciplinary care team collaboration (see longitudinal plan of care)  Encouraged to avoid being outside during the middle of the day when it is hot outside Reviewed upcoming scheduled appointments:PCP 7/28 Provided RNCM contact information for any healthcare related barriers Congratulated patient on his sobriety Patient Goals/Self-Care Activities:  - avoid outdoors during the warmest part of the day - avoid second hand smoke - identify and remove indoor air pollutants - limit outdoor activity during cold weather - listen for public air quality announcements every day  - take prescribed medications as directed - call PCP with new concerns or questions - develop  a new routine to improve sleep - get at least 7 to 8 hours of sleep at night - keep room cool and dark - practice relaxation or meditation daily  - call PCP with new concerns or questions - take all medications as directed Follow Up Plan: Telephone follow up appointment with care management team member scheduled for:07/21/20 @ 10am     Patient Care Plan: Medication Management     Problem Identified: Health Promotion or Disease Self-Management (General Plan of Care)      Goal: Medication Management   Note:   Current Barriers:  Unable to independently monitor therapeutic efficacy Unable to self administer medications as prescribed Does not adhere to prescribed medication regimen Does not maintain contact with provider office Does not contact provider office for questions/concerns  Pharmacist Clinical Goal(s):  Over the next 30 days, patient will contact provider office for questions/concerns as evidenced notation of same in electronic health record through collaboration with PharmD and provider.    Interventions: Inter-disciplinary care team collaboration (see longitudinal plan of care) Comprehensive medication review performed; medication list updated in electronic medical record   Health Maintenance  Patient Goals/Self-Care Activities Over the next 30 days, patient will:  - collaborate with provider on medication access solutions  Follow Up Plan: The care management team will reach out to the patient again over the next 30 days.       Patient Care Plan: Wellness (Adult)     Problem Identified: (RNCM) Alcohol Use (Wellness)      Long-Range Goal: Alcohol Use Managed   Start Date: 04/25/2020  Expected End Date: 08/25/2020  Recent Progress: On track  Priority: High  Note:   Current Barriers:  Ineffective Self Health Maintenance-Mr. Pinder was admitted on 4/16-4/21. Patient reports it was because of the alcohol. He wants to quit and feels that he can stop drinking  on his own.  Update-Mr. Brancato reports no alcohol and no desire to drink. He needs two medications refilled and request RNCM to assist him Monday at 10am. He is not at home currently and is unsure of which two medications need refilled. Does not adhere to provider recommendations re: quit drinking alcohol Currently UNABLE TO independently self manage needs related to chronic health conditions.  Knowledge Deficits related to short term plan for care coordination needs and long term plans for chronic disease management needs Nurse Case Manager Clinical Goal(s):  patient will work with care management team to address care coordination and chronic disease management needs related to alcohol use   Interventions:  Evaluation of current treatment plan related to alcohol use and quitting and patient's adherence to plan as established by provider. Advised patient to avoid friends/family/situations that drinking is involved, consider counseling for quitting drinking, reach out to supportive friends/family Discussed plans with patient for ongoing care management follow up and provided patient with direct contact information for care management team Provided therapeutic listening Self Care Activities:  Patient will self administer medications as prescribed Patient will attend all scheduled provider appointments Patient will call pharmacy for medication refills Patient will call provider office for new concerns or questions Patient Goals: - avoid people and places where alcohol is used - develop a plan to avoid relapse  - think about counseling to assist in [place] - work with Ubaldo Glassing for therapy options Follow Up Plan: Telephone follow up appointment with care management team member scheduled for:07/21/20 @ 10am

## 2020-07-18 NOTE — Patient Outreach (Signed)
Medicaid Managed Care   Nurse Care Manager Note  07/18/2020 Name:  James [name] MRN:  [id number] DOB:  December 01, 1960  James [name] is an 60 y.o. year old male who is a primary patient of [name], MD.  The [name] Managed Care Coordination team was consulted for assistance with:    COPD DMII  James [name] was given information about Medicaid Managed Care Coordination team services today. James [name] agreed to services and verbal consent obtained.  Engaged with patient by telephone for follow up visit in response to provider referral for case management and/or care coordination services.   Assessments/Interventions:  Review of past medical history, allergies, medications, health status, including review of consultants reports, laboratory and other test data, was performed as part of comprehensive evaluation and provision of chronic care management services.  SDOH (Social Determinants of Health) assessments and interventions performed:   Care Plan  Allergies  Allergen Reactions   Gabapentin Other (See Comments)    hallucinations    Medications Reviewed Today     Reviewed by Lannette Donath, CPhT (Pharmacy Technician) on 06/26/20 at [place] List Status: Complete   Medication Order Taking? Sig Documenting Provider Last Dose Status Informant  Accu-Chek Softclix Lancets lancets [id number] No Use as instructed to check blood sugar once daily.  Patient not taking: Reported on 06/26/2020   James Stain, MD Not Taking Active [name]  albuterol (PROVENTIL) (2.5 MG/3ML) 0.083% nebulizer solution [id number] Yes Take 3 mLs (2.5 mg total) by nebulization every 6 (six) hours as needed for wheezing or shortness of breath. James Stain, MD 06/25/2020 Active [name]  albuterol (VENTOLIN HFA) 108 (90 Base) MCG/ACT inhaler [id number] Yes Inhale 2 puffs into the lungs every 4 (four) hours as needed for wheezing or shortness of breath. Ward, Delice Bison, DO 06/25/2020 Active [name]  Blood Glucose  Monitoring Suppl (ACCU-CHEK GUIDE ME) w/Device KIT [id number] No 1 each by Other route in the morning and at bedtime.  Patient not taking: Reported on 06/26/2020   [provider] Not Taking Active [name]  budesonide-formoterol (SYMBICORT) 160-4.5 MCG/ACT inhaler [id number] Yes INHALE 2 PUFFS INTO THE LUNGS 2 (TWO) TIMES DAILY. James Stain, MD 06/25/2020 Active [name]  furosemide (LASIX) 20 MG tablet [id number] Yes Take 1 tablet (20 mg total) by mouth daily as needed for edema. James Stain, MD unk Active [name]  glucose blood (TRUE METRIX BLOOD GLUCOSE TEST) test strip [id number] No Use as instructed  Patient not taking: Reported on 06/26/2020   James Stain, MD Not Taking Active [name]  guaiFENesin (MUCINEX) 600 MG 12 hr tablet [id number] Yes TAKE 1 TABLET (600 MG TOTAL) BY MOUTH 2 (TWO) TIMES DAILY.  Patient taking differently: Take 600 mg by mouth 2 (two) times daily as needed for cough or to loosen phlegm.   James August, MD unk Active [name]  losartan (COZAAR) 100 MG tablet [id number] Yes Take 1 tablet (100 mg total) by mouth daily. James Stain, MD 06/25/2020 Active [name]  metFORMIN (GLUCOPHAGE) 1000 MG tablet [id number] No Take 1 tablet (1,000 mg total) by mouth 2 (two) times daily with a meal.  Patient not taking: Reported on 06/26/2020   James Stain, MD Not Taking Active [name]  metoprolol succinate (TOPROL-XL) 25 MG 24 hr tablet [id number] Yes Take 1 tablet (25 mg total) by mouth daily. James Stain, MD 06/25/2020 0830 Active [name]  Multiple Vitamin (MULTIVITAMIN WITH MINERALS) TABS tablet [id number] No Take 1 tablet by mouth  daily.  Patient not taking: Reported on 06/26/2020   James August, MD Not Taking Active [name]  pantoprazole (PROTONIX) 40 MG tablet [id number] No Take 1 tablet (40 mg total) by mouth daily.  Patient not taking: Reported on 06/26/2020   James Stain, MD Not Taking Active [name]  predniSONE (DELTASONE) 10 MG tablet [id number] Yes Take 2 tablets (20  mg total) by mouth daily with breakfast. James Stain, MD 06/25/2020 Active [name]  Respiratory Therapy Supplies (FLUTTER) DEVI [id number]  Use 4 times daily  Patient taking differently: 1 each by Other route See admin instructions. Use 4 times daily   James Stain, MD  Active [name]  Tiotropium Bromide Monohydrate 2.5 MCG/ACT AERS [id number] Yes INHALE 2 PUFFS INTO THE LUNGS DAILY. James Stain, MD 06/25/2020 Active [name]            Patient Active Problem List   Diagnosis Date Noted   CAP (community acquired pneumonia) 06/26/2020   Palliative care encounter 05/26/2020   Alcohol withdrawal syndrome, with delirium ([place])    Obesity (BMI 30.0-34.9) 04/19/2020   Pain of right forearm 12/03/2019   Bronchiectasis ([place]) 09/24/2019   Type 2 diabetes mellitus without complication, without long-term current use of insulin ([place]) 07/24/2019   Other atopic dermatitis 05/01/2019   Chronic respiratory failure with hypoxia ([place]) 04/23/2019   Intellectual disability 04/23/2019   History of alcohol use 11/23/2018   Hypertension 05/17/2016   History of tobacco abuse 04/06/2016   COPD mixed type ([place]) 03/26/2016    Conditions to be addressed/monitored per PCP order:  COPD and DMII  Care Plan : COPD (Adult)  Updates made by [name], RN since 07/18/2020 12:00 AM     Problem: Symptom Exacerbation (COPD)      Long-Range Goal: Symptom Exacerbation Prevented or Minimized   Start Date: 04/09/2020  Expected End Date: 08/18/2020  Recent Progress: On track  Priority: High  Note:   Current Barriers:  Knowledge deficits related to basic COPD [name] care/management-James [name] was recently admitted for COPD exacerbation and pnuemonia. He reports taking his medications, wearing oxygen continuously, and has a follow up with his PCP on 04/14/20. He does report having difficulty at times swallowing food and liquid, pain in back and legs, and difficulty falling asleep. He is planning to discuss these  issues with his PCP. James [name] reports having transportation and plenty of food. He lives alone, and has support from his sister. James [name] attended his follow up with PCP. He was working outside at his home today and reports doing "about the same". He reports increased difficulty breathing when it is hot outside. He has his medications and oxygen, reports no needs at this time. He is working with [facility] pharmacist for medication management.Update-Today Mr. Dickerman reports his breathing is about the same, worse with the warm temperatures. He needs two medications refilled and request RNCM to call him on Monday at 10am to assist in requesting refills. He is aware of his PCP appointment on 7/28 and has transportation.  He has not been drinking alcohol and reports feeling better since quitting. Does not contact provider office for questions/concerns  Case Manager Clinical Goal(s): patient will report using inhalers as prescribed including rinsing mouth after use patient will engage in lite exercise as tolerated to build/regain stamina and strength and reduce shortness of breath through activity tolerance patient will verbalize basic understanding of COPD disease process and [name] care activities patient will not be hospitalized for COPD exacerbation as evidenced  Patient will work with [facility] pharmacist for medication management-Met Patient will attend all scheduled appointments Interventions:  Inter-disciplinary care team collaboration (see longitudinal plan of care)  Encouraged to avoid being outside during the middle of the day when it is hot outside Reviewed upcoming scheduled appointments:PCP 7/28 Provided RNCM contact information for any healthcare related barriers Congratulated patient on his sobriety Patient Goals/[name]-Care Activities:  - avoid outdoors during the warmest part of the day - avoid second hand smoke - identify and remove indoor air pollutants - limit outdoor activity during cold  weather - listen for public air quality announcements every day  - take prescribed medications as directed - call PCP with new concerns or questions - develop a new routine to improve sleep - get at least 7 to 8 hours of sleep at night - keep room cool and dark - practice relaxation or meditation daily  - call PCP with new concerns or questions - take all medications as directed Follow Up Plan: Telephone follow up appointment with care management team member scheduled for:07/21/20 @ [facility] (Adult)  Updates made by [name], RN since 07/18/2020 12:00 AM     Problem: (RNCM) Alcohol Use (Wellness)      Long-Range Goal: Alcohol Use Managed   Start Date: 04/25/2020  Expected End Date: 08/25/2020  Recent Progress: On track  Priority: High  Note:   Current Barriers:  Ineffective [name] Health Maintenance-Mr. Hartshorn was admitted on 4/16-4/21. Patient reports it was because of the alcohol. He wants to quit and feels that he can stop drinking on his own.  Update-Mr. Mestre reports no alcohol and no desire to drink. He needs two medications refilled and request RNCM to assist him Monday at 10am. He is not at home currently and is unsure of which two medications need refilled. Does not adhere to provider recommendations re: quit drinking alcohol Currently UNABLE TO independently [name] manage needs related to chronic health conditions.  Knowledge Deficits related to short term plan for care coordination needs and long term plans for chronic disease management needs Nurse Case Manager Clinical Goal(s):  patient will work with care management team to address care coordination and chronic disease management needs related to alcohol use   Interventions:  Evaluation of current treatment plan related to alcohol use and quitting and patient's adherence to plan as established by provider. Advised patient to avoid friends/family/situations that drinking is involved, consider  counseling for quitting drinking, reach out to supportive friends/family Discussed plans with patient for ongoing care management follow up and provided patient with direct contact information for care management team Provided therapeutic listening [name] Care Activities:  Patient will [name] administer medications as prescribed Patient will attend all scheduled provider appointments Patient will call pharmacy for medication refills Patient will call provider office for new concerns or questions Patient Goals: - avoid people and places where alcohol is used - develop a plan to avoid relapse  - think about counseling to assist in McRoberts - work with [facility] Glassing for therapy options Follow Up Plan: Telephone follow up appointment with care management team member scheduled for:07/21/20 @ 10am      Follow Up:  Patient agrees to Care Plan and Follow-up.  Plan: The Managed Medicaid care management team will reach out to the patient again over the next 3 days.  Date/time of next scheduled RN care management/care coordination outreach:  07/21/20 @ 10am  [name] RN, [place]  Network Electronics engineer

## 2020-07-21 ENCOUNTER — Other Ambulatory Visit: Payer: Self-pay | Admitting: *Deleted

## 2020-07-21 ENCOUNTER — Other Ambulatory Visit: Payer: Self-pay

## 2020-07-21 ENCOUNTER — Other Ambulatory Visit: Payer: Self-pay | Admitting: Critical Care Medicine

## 2020-07-21 MED ORDER — PANTOPRAZOLE SODIUM 40 MG PO TBEC
40.0000 mg | DELAYED_RELEASE_TABLET | Freq: Every day | ORAL | 6 refills | Status: DC
  Filled 2020-07-21: qty 30, 30d supply, fill #0

## 2020-07-21 NOTE — Patient Outreach (Signed)
Medicaid Managed Care   Nurse Care Manager Note  07/21/2020 Name:  James [name] MRN:  [id number] DOB:  1960/05/26  James [name] is an 60 y.o. year old male who is a primary patient of [name], MD.  The [facility] Managed Care Coordination team was consulted for assistance with:    HTN COPD DMII  James [name] was given information about Medicaid Managed Care Coordination team services today. James [name] agreed to services and verbal consent obtained.  Engaged with patient by telephone for follow up visit in response to provider referral for case management and/or care coordination services.   Assessments/Interventions:  Review of past medical history, allergies, medications, health status, including review of consultants reports, laboratory and other test data, was performed as part of comprehensive evaluation and provision of chronic care management services.  SDOH (Social Determinants of Health) assessments and interventions performed:   Care Plan  Allergies  Allergen Reactions   Gabapentin Other (See Comments)    hallucinations    Medications Reviewed Today     Reviewed by Lannette Donath, CPhT (Pharmacy Technician) on 06/26/20 at [place] List Status: Complete   Medication Order Taking? Sig Documenting Provider Last Dose Status Informant  Accu-Chek Softclix Lancets lancets [id number] No Use as instructed to check blood sugar once daily.  Patient not taking: Reported on 06/26/2020   [name], MD Not Taking Active Self  albuterol (PROVENTIL) (2.5 MG/3ML) 0.083% nebulizer solution [id number] Yes Take 3 mLs (2.5 mg total) by nebulization every 6 (six) hours as needed for wheezing or shortness of breath. [name], MD 06/25/2020 Active Self  albuterol (VENTOLIN HFA) 108 (90 Base) MCG/ACT inhaler [id number] Yes Inhale 2 puffs into the lungs every 4 (four) hours as needed for wheezing or shortness of breath. Ward, Delice Bison, DO 06/25/2020 Active Self  Blood  Glucose Monitoring Suppl (ACCU-CHEK GUIDE ME) w/Device KIT [id number] No 1 each by Other route in the morning and at bedtime.  Patient not taking: Reported on 06/26/2020   [provider] Not Taking Active Self  budesonide-formoterol (SYMBICORT) 160-4.5 MCG/ACT inhaler [id number] Yes INHALE 2 PUFFS INTO THE LUNGS 2 (TWO) TIMES DAILY. [name], MD 06/25/2020 Active Self  furosemide (LASIX) 20 MG tablet [id number] Yes Take 1 tablet (20 mg total) by mouth daily as needed for edema. [name], MD unk Active Self  glucose blood (TRUE METRIX BLOOD GLUCOSE TEST) test strip [id number] No Use as instructed  Patient not taking: Reported on 06/26/2020   [name], MD Not Taking Active Self  guaiFENesin (MUCINEX) 600 MG 12 hr tablet [id number] Yes TAKE 1 TABLET (600 MG TOTAL) BY MOUTH 2 (TWO) TIMES DAILY.  Patient taking differently: Take 600 mg by mouth 2 (two) times daily as needed for cough or to loosen phlegm.   [name], MD unk Active Self  losartan (COZAAR) 100 MG tablet [id number] Yes Take 1 tablet (100 mg total) by mouth daily. [name], MD 06/25/2020 Active Self  metFORMIN (GLUCOPHAGE) 1000 MG tablet [id number] No Take 1 tablet (1,000 mg total) by mouth 2 (two) times daily with a meal.  Patient not taking: Reported on 06/26/2020   [name], MD Not Taking Active Self  metoprolol succinate (TOPROL-XL) 25 MG 24 hr tablet [id number] Yes Take 1 tablet (25 mg total) by mouth daily. [name], MD 06/25/2020 0830 Active Self  Multiple Vitamin (MULTIVITAMIN WITH MINERALS) TABS tablet [id number] No Take 1 tablet by  mouth daily.  Patient not taking: Reported on 06/26/2020   [name], MD Not Taking Active Self  pantoprazole (PROTONIX) 40 MG tablet [id number] No Take 1 tablet (40 mg total) by mouth daily.  Patient not taking: Reported on 06/26/2020   [name], MD Not Taking Active Self  predniSONE (DELTASONE) 10 MG tablet [id number] Yes Take 2  tablets (20 mg total) by mouth daily with breakfast. [name], MD 06/25/2020 Active Self  Respiratory Therapy Supplies (FLUTTER) DEVI [id number]  Use 4 times daily  Patient taking differently: 1 each by Other route See admin instructions. Use 4 times daily   [name], MD  Active Self  Tiotropium Bromide Monohydrate 2.5 MCG/ACT AERS [id number] Yes INHALE 2 PUFFS INTO THE LUNGS DAILY. [name], MD 06/25/2020 Active Self            Patient Active Problem List   Diagnosis Date Noted   CAP (community acquired pneumonia) 06/26/2020   Palliative care encounter 05/26/2020   Alcohol withdrawal syndrome, with delirium ([place])    Obesity (BMI 30.0-34.9) 04/19/2020   Pain of right forearm 12/03/2019   Bronchiectasis ([place]) 09/24/2019   Type 2 diabetes mellitus without complication, without long-term current use of insulin ([place]) 07/24/2019   Other atopic dermatitis 05/01/2019   Chronic respiratory failure with hypoxia ([place]) 04/23/2019   Intellectual disability 04/23/2019   History of alcohol use 11/23/2018   Hypertension 05/17/2016   History of tobacco abuse 04/06/2016   COPD mixed type ([place]) 03/26/2016    Conditions to be addressed/monitored per PCP order:  HTN, COPD, and DMII  Care Plan : COPD (Adult)  Updates made by [name], RN since 07/21/2020 12:00 AM     Problem: Symptom Exacerbation (COPD)      Long-Range Goal: Symptom Exacerbation Prevented or Minimized   Start Date: 04/09/2020  Expected End Date: 08/18/2020  Recent Progress: On track  Priority: High  Note:   Current Barriers:  Knowledge deficits related to basic COPD self care/management-James [name] was recently admitted for COPD exacerbation and pnuemonia. He reports taking his medications, wearing oxygen continuously, and has a follow up with his PCP on 04/14/20. He does report having difficulty at times swallowing food and liquid, pain in back and legs, and difficulty falling asleep. He is planning  to discuss these issues with his PCP. James [name] reports having transportation and plenty of food. He lives alone, and has support from his sister. James [name] attended his follow up with PCP. He was working outside at his home today and reports doing "about the same". He reports increased difficulty breathing when it is hot outside. He has his medications and oxygen, reports no needs at this time. He is working with [facility] pharmacist for medication management.Update-James [name] reports his breathing is about the same, worse with the warm temperatures. He needs two medications refilled and request RNCM to call him on Monday at 10am to assist in requesting refills. He is aware of his PCP appointment on 7/28 and has transportation.  He has not been drinking alcohol and reports feeling better since quitting.-Update-RNCM called patient to assist with refilling needed medications. Patient reports needing furosemide and pantoprazole.  Does not contact provider office for questions/concerns  Case Manager Clinical Goal(s): patient will report using inhalers as prescribed including rinsing mouth after use patient will engage in lite exercise as tolerated to build/regain stamina and strength and reduce shortness of breath through activity tolerance patient will verbalize basic understanding of COPD  disease process and self care activities patient will not be hospitalized for COPD exacerbation as evidenced  Patient will work with [facility] pharmacist for medication management-[facility] Patient will attend all scheduled appointments Interventions:  Inter-disciplinary care team collaboration (see longitudinal plan of care)  Encouraged to avoid being outside during the middle of the day when it is hot outside Reviewed upcoming scheduled appointments:PCP 7/28 Provided RNCM contact information for any healthcare related barriers RNCM collaborated with patient's pharmacy for medications. Patient picked up furosemide on 7/11 and should  have 19 days left of this medication. Pantoprozole was d/c'd on last hospital admission. RNCM collaborated with PCP for new prescription of pantoprazole. Patient Goals/Self-Care Activities:  - avoid outdoors during the warmest part of the day - avoid second hand smoke - identify and remove indoor air pollutants - limit outdoor activity during cold weather - listen for public air quality announcements every day  - take prescribed medications as directed - call PCP with new concerns or questions - develop a new routine to improve sleep - get at least 7 to 8 hours of sleep at night - keep room cool and dark - practice relaxation or meditation daily  - call PCP with new concerns or questions - take all medications as directed Follow Up Plan: Telephone follow up appointment with care management team member scheduled for:08/14/20 @ 3:30pm      Follow Up:  Patient agrees to Care Plan and Follow-up.  Plan: The Managed Medicaid care management team will reach out to the patient again over the next 21 days.  Date/time of next scheduled RN care management/care coordination outreach:  08/14/20 @ 3:30pm  [name] RN, [place] RN Care Coordinator

## 2020-07-21 NOTE — Patient Instructions (Signed)
Visit Information  [name] was given information about Medicaid Managed Care team care coordination services as a part of their Healthy [facility] Medicaid benefit. James [name]  to engagement with the [facility] Managed Care team.   For questions related to your Healthy [facility] health plan, please call: [phone] or visit the homepage here: [email or web address]  If you would like to schedule transportation through your Healthy [facility] plan, please call the following number at least 2 days in advance of your appointment: [phone]  Call the [place] at [phone], at any time, 24 hours a day, 7 days a week. If you are in danger or need immediate medical attention call 911.  If you would like help to quit smoking, call 1-800-QUIT-NOW 571-582-6563) OR Espaol: 1-855-Djelo-Ya (1-093-235-5732) o para ms informacin haga clic aqu or Text READY to 200-400 to register via text  [name] - following are the goals we discussed in your visit today:   Goals Addressed             This Visit's Progress    Stop or Cut Down Drinking Alcohol       Timeframe:  Long-Range Goal Priority:  High Start Date:   04/25/20                          Expected End Date:  08/25/20                     Follow Up Date 08/14/20   - avoid people and places where alcohol is used - develop a plan to avoid relapse  - think about counseling to assist in James [name] - work with James [name]/James [name] for therapy options   Why is this important?   To stop drinking it is important to have support from a person or group of people who you can count on.  You will also need to think about the things that make you feel like using alcohol; then plan for how to handle them.         Track and Manage My Triggers-COPD       Timeframe:  Long-Range Goal Priority:  High Start Date:  04/09/20                           Expected End Date:   08/18/20                     Follow Up Date 08/14/20   - avoid outdoors during the warmest part of the day - avoid second hand smoke - identify and remove indoor air pollutants - limit outdoor activity during cold weather - listen for public air quality announcements every day  - take prescribed medications as directed - call PCP with new concerns or questions - take all medications as directed   Why is this important?   Triggers are activities or things, like tobacco smoke or cold weather, that make your COPD (chronic obstructive pulmonary disease) flare-up.  Knowing these triggers helps you plan how to stay away from them.  When you cannot remove them, you can learn how to manage them.             Please see education materials related to GERD provided by MyChart link.  Patient verbalizes understanding of instructions provided today.   Telephone follow up appointment with Managed Medicaid care management team member scheduled for:08/14/20 @  3:30pm  James [name], BSN [place] [name] Care Coordinator   Following is a copy of your plan of care:  Patient Care Plan: COPD (Adult)     Problem Identified: Symptom Exacerbation (COPD)      Long-Range Goal: Symptom Exacerbation Prevented or Minimized   Start Date: 04/09/2020  Expected End Date: 08/18/2020  Recent Progress: On track  Priority: High  Note:   Current Barriers:  Knowledge deficits related to basic COPD self care/management-[name] was recently admitted for COPD exacerbation and pnuemonia. He reports taking his medications, wearing oxygen continuously, and has a follow up with his PCP on 04/14/20. He does report having difficulty at times swallowing food and liquid, pain in back and legs, and difficulty falling asleep. He is planning to discuss these issues with his PCP. [name] reports having transportation and plenty of food. He lives alone, and has support from his sister. Mr. Punch attended his follow up  with PCP. He was working outside at his home today and reports doing "about the same". He reports increased difficulty breathing when it is hot outside. He has his medications and oxygen, reports no needs at this time. He is working with [facility] pharmacist for medication management.Update-[name] reports his breathing is about the same, worse with the warm temperatures. He needs two medications refilled and request James [name] to call him on Monday at 10am to assist in requesting refills. He is aware of his PCP appointment on 7/28 and has transportation.  He has not been drinking alcohol and reports feeling better since quitting.-Update-James [name] called patient to assist with refilling needed medications. Patient reports needing James [name] and James [name].  Does not contact provider office for questions/concerns  Case Manager Clinical Goal(s): patient will report using inhalers as prescribed including rinsing mouth after use patient will engage in lite exercise as tolerated to build/regain stamina and strength and reduce shortness of breath through activity tolerance patient will verbalize basic understanding of COPD disease process and self care activities patient will not be hospitalized for COPD exacerbation as evidenced  Patient will work with [facility] pharmacist for medication management-Met Patient will attend all scheduled appointments Interventions:  Inter-disciplinary care team collaboration (see longitudinal plan of care)  Encouraged to avoid being outside during the middle of the day when it is hot outside Reviewed upcoming scheduled appointments:PCP 7/28 Provided James [name] contact information for any healthcare related barriers James [name] collaborated with patient's pharmacy for medications. Patient picked up James [name] on 7/11 and should have 19 days left of this medication. Pantoprozole was d/c'd on last hospital admission. James [name] collaborated with PCP for new prescription of James [name]. Patient Goals/Self-Care  Activities:  - avoid outdoors during the warmest part of the day - avoid second hand smoke - identify and remove indoor air pollutants - limit outdoor activity during cold weather - listen for public air quality announcements every day  - take prescribed medications as directed - call PCP with new concerns or questions - develop a new routine to improve sleep - get at least 7 to 8 hours of sleep at night - keep room cool and dark - practice relaxation or meditation daily  - call PCP with new concerns or questions - take all medications as directed Follow Up Plan: Telephone follow up appointment with care management team member scheduled for:08/14/20 @ 3:30pm     Patient Care Plan: Medication Management     Problem Identified: Health Promotion or Disease Self-Management (General Plan of Care)  Goal: Medication Management   Note:   Current Barriers:  Unable to independently monitor therapeutic efficacy Unable to self administer medications as prescribed Does not adhere to prescribed medication regimen Does not maintain contact with provider office Does not contact provider office for questions/concerns   Pharmacist Clinical Goal(s):  Over the next 30 days, patient will contact provider office for questions/concerns as evidenced notation of same in electronic health record through collaboration with PharmD and provider.    Interventions: Inter-disciplinary care team collaboration (see longitudinal plan of care) Comprehensive medication review performed; medication list updated in electronic medical record  _0 @ _1 @ _2 @ Health Maintenance  Patient Goals/Self-Care Activities Over the next 30 days, patient will:  - collaborate with provider on medication access solutions  Follow Up Plan: The care management team will reach out to the patient again over the next 30 days.       Patient Care Plan: Wellness (Adult)      Problem Identified: (James [name]) Alcohol Use (Wellness)      Long-Range Goal: Alcohol Use Managed   Start Date: 04/25/2020  Expected End Date: 08/25/2020  Recent Progress: On track  Priority: High  Note:   Current Barriers:  Ineffective Self Health Maintenance-[name] was admitted on 4/16-4/21. Patient reports it was because of the alcohol. He wants to quit and feels that he can stop drinking on his own.  Update-[name] reports no alcohol and no desire to drink. He needs two medications refilled and request James [name] to assist him Monday at 10am. He is not at home currently and is unsure of which two medications need refilled. Does not adhere to provider recommendations re: quit drinking alcohol Currently UNABLE TO independently self manage needs related to chronic health conditions.  Knowledge Deficits related to short term plan for care coordination needs and long term plans for chronic disease management needs Nurse Case Manager Clinical Goal(s):  patient will work with care management team to address care coordination and chronic disease management needs related to alcohol use   Interventions:  Evaluation of current treatment plan related to alcohol use and quitting and patient's adherence to plan as established by provider. Advised patient to avoid friends/family/situations that drinking is involved, consider counseling for quitting drinking, reach out to supportive friends/family Discussed plans with patient for ongoing care management follow up and provided patient with direct contact information for care management team Provided therapeutic listening Self Care Activities:  Patient will self administer medications as prescribed Patient will attend all scheduled provider appointments Patient will call pharmacy for medication refills Patient will call provider office for new concerns or questions Patient Goals: - avoid people and places where alcohol is used - develop a plan to avoid  relapse  - think about counseling to assist in [place] - work with James [name] for therapy options Follow Up Plan: Telephone follow up appointment with care management team member scheduled for:07/21/20 @ 10am

## 2020-07-22 ENCOUNTER — Other Ambulatory Visit: Payer: Self-pay | Admitting: Critical Care Medicine

## 2020-07-22 ENCOUNTER — Other Ambulatory Visit: Payer: Self-pay | Admitting: *Deleted

## 2020-07-22 ENCOUNTER — Other Ambulatory Visit: Payer: Self-pay

## 2020-07-22 MED ORDER — METFORMIN HCL 1000 MG PO TABS
1000.0000 mg | ORAL_TABLET | Freq: Two times a day (BID) | ORAL | 3 refills | Status: DC
  Filled 2020-07-22: qty 180, 90d supply, fill #0

## 2020-07-22 NOTE — Patient Outreach (Signed)
Medicaid Managed Care   Nurse Care Manager Note  07/22/2020 Name:  James [name] MRN:  [id number] DOB:  Feb 01, 1960  James [name] is an 60 y.o. year old male who is a primary patient of [name], MD.  The [facility] Managed Care Coordination team was consulted for assistance with:    HTN COPD DMII  James [name] was given information about Medicaid Managed Care Coordination team services today. James [name] agreed to services and verbal consent obtained.  Engaged with patient by telephone for follow up visit in response to provider referral for case management and/or care coordination services.   Assessments/Interventions:  Review of past medical history, allergies, medications, health status, including review of consultants reports, laboratory and other test data, was performed as part of comprehensive evaluation and provision of chronic care management services.  SDOH (Social Determinants of Health) assessments and interventions performed:   Care Plan  Allergies  Allergen Reactions   Gabapentin Other (See Comments)    hallucinations    Medications Reviewed Today     Reviewed by Lannette Donath, CPhT (Pharmacy Technician) on 06/26/20 at [facility] Status: Complete   Medication Order Taking? Sig Documenting Provider Last Dose Status Informant  Accu-Chek Softclix Lancets lancets [id number] No Use as instructed to check blood sugar once daily.  Patient not taking: Reported on 06/26/2020   [name], MD Not Taking Active Self  albuterol (PROVENTIL) (2.5 MG/3ML) 0.083% nebulizer solution [id number] Yes Take 3 mLs (2.5 mg total) by nebulization every 6 (six) hours as needed for wheezing or shortness of breath. [name], MD 06/25/2020 Active Self  albuterol (VENTOLIN HFA) 108 (90 Base) MCG/ACT inhaler [id number] Yes Inhale 2 puffs into the lungs every 4 (four) hours as needed for wheezing or shortness of breath. Ward, Delice Bison, DO 06/25/2020 Active Self  Blood  Glucose Monitoring Suppl (ACCU-CHEK GUIDE ME) w/Device KIT [id number] No 1 each by Other route in the morning and at bedtime.  Patient not taking: Reported on 06/26/2020   [provider] Not Taking Active Self  budesonide-formoterol (SYMBICORT) 160-4.5 MCG/ACT inhaler [id number] Yes INHALE 2 PUFFS INTO THE LUNGS 2 (TWO) TIMES DAILY. [name], MD 06/25/2020 Active Self  furosemide (LASIX) 20 MG tablet [id number] Yes Take 1 tablet (20 mg total) by mouth daily as needed for edema. [name], MD unk Active Self  glucose blood (TRUE METRIX BLOOD GLUCOSE TEST) test strip [id number] No Use as instructed  Patient not taking: Reported on 06/26/2020   [name], MD Not Taking Active Self  guaiFENesin (MUCINEX) 600 MG 12 hr tablet [id number] Yes TAKE 1 TABLET (600 MG TOTAL) BY MOUTH 2 (TWO) TIMES DAILY.  Patient taking differently: Take 600 mg by mouth 2 (two) times daily as needed for cough or to loosen phlegm.   [name], MD unk Active Self  losartan (COZAAR) 100 MG tablet [id number] Yes Take 1 tablet (100 mg total) by mouth daily. [name], MD 06/25/2020 Active Self  metFORMIN (GLUCOPHAGE) 1000 MG tablet [id number] No Take 1 tablet (1,000 mg total) by mouth 2 (two) times daily with a meal.  Patient not taking: Reported on 06/26/2020   [name], MD Not Taking Active Self  metoprolol succinate (TOPROL-XL) 25 MG 24 hr tablet [id number] Yes Take 1 tablet (25 mg total) by mouth daily. [name], MD 06/25/2020 0830 Active Self  Multiple Vitamin (MULTIVITAMIN WITH MINERALS) TABS tablet [id number] No Take 1 tablet by  mouth daily.  Patient not taking: Reported on 06/26/2020   [name], MD Not Taking Active Self  pantoprazole (PROTONIX) 40 MG tablet [id number] No Take 1 tablet (40 mg total) by mouth daily.  Patient not taking: Reported on 06/26/2020   [name], MD Not Taking Active Self  predniSONE (DELTASONE) 10 MG tablet [id number] Yes Take 2  tablets (20 mg total) by mouth daily with breakfast. [name], MD 06/25/2020 Active Self  Respiratory Therapy Supplies (FLUTTER) DEVI [id number]  Use 4 times daily  Patient taking differently: 1 each by Other route See admin instructions. Use 4 times daily   [name], MD  Active Self  Tiotropium Bromide Monohydrate 2.5 MCG/ACT AERS [id number] Yes INHALE 2 PUFFS INTO THE LUNGS DAILY. [name], MD 06/25/2020 Active Self            Patient Active Problem List   Diagnosis Date Noted   CAP (community acquired pneumonia) 06/26/2020   Palliative care encounter 05/26/2020   Alcohol withdrawal syndrome, with delirium ([place])    Obesity (BMI 30.0-34.9) 04/19/2020   Pain of right forearm 12/03/2019   Bronchiectasis ([place]) 09/24/2019   Type 2 diabetes mellitus without complication, without long-term current use of insulin ([place]) 07/24/2019   Other atopic dermatitis 05/01/2019   Chronic respiratory failure with hypoxia ([place]) 04/23/2019   Intellectual disability 04/23/2019   History of alcohol use 11/23/2018   Hypertension 05/17/2016   History of tobacco abuse 04/06/2016   COPD mixed type ([place]) 03/26/2016    Conditions to be addressed/monitored per PCP order:  HTN, COPD, and DMII  Care Plan : COPD (Adult)  Updates made by [name], RN since 07/22/2020 12:00 AM     Problem: Symptom Exacerbation (COPD)      Long-Range Goal: Symptom Exacerbation Prevented or Minimized   Start Date: 04/09/2020  Expected End Date: 08/18/2020  Recent Progress: On track  Priority: High  Note:   Current Barriers:  Knowledge deficits related to basic COPD self care/management-James [name] was recently admitted for COPD exacerbation and pnuemonia. He reports taking his medications, wearing oxygen continuously, and has a follow up with his PCP on 04/14/20. He does report having difficulty at times swallowing food and liquid, pain in back and legs, and difficulty falling asleep. He is planning  to discuss these issues with his PCP. James [name] reports having transportation and plenty of food. He lives alone, and has support from his sister. James [name] attended his follow up with PCP. He was working outside at his home today and reports doing "about the same". He reports increased difficulty breathing when it is hot outside. He has his medications and oxygen, reports no needs at this time. He is working with [facility] pharmacist for medication management.Update-James [name] reports his breathing is about the same, worse with the warm temperatures. He needs two medications refilled and request RNCM to call him on Monday at 10am to assist in requesting refills. He is aware of his PCP appointment on 7/28 and has transportation.  He has not been drinking alcohol and reports feeling better since quitting.-Update-RNCM called patient to assist with refilling needed medications. Patient reports needing furosemide and pantoprazole. 07/22/20-Today patient reports needing metformin 1017m to take twice daily. He has 12 pills left. This medication was discontinued from his hospital discharge list. RNCM will collaborate with PCP for clarification/new prescription.  Does not contact provider office for questions/concerns  Case Manager Clinical Goal(s): patient will report using inhalers as prescribed including  rinsing mouth after use patient will engage in lite exercise as tolerated to build/regain stamina and strength and reduce shortness of breath through activity tolerance patient will verbalize basic understanding of COPD disease process and self care activities patient will not be hospitalized for COPD exacerbation as evidenced  Patient will work with [facility] pharmacist for medication management-Met Patient will attend all scheduled appointments Interventions:  Inter-disciplinary care team collaboration (see longitudinal plan of care)  Encouraged to avoid being outside during the middle of the day when it is hot  outside Reviewed upcoming scheduled appointments:PCP 7/28-RNCM will send printed copy of AVS Provided RNCM contact information for any healthcare related barriers RNCM collaborated with patient's pharmacy for medications. Patient picked up furosemide on 7/11 and should have 19 days left of this medication. Pantoprozole was d/c'd on last hospital admission. RNCM collaborated with PCP for new prescription of metformin. Patient Goals/Self-Care Activities:  - avoid outdoors during the warmest part of the day - avoid second hand smoke - identify and remove indoor air pollutants - limit outdoor activity during cold weather - listen for public air quality announcements every day  - take prescribed medications as directed - call PCP with new concerns or questions - develop a new routine to improve sleep - get at least 7 to 8 hours of sleep at night - keep room cool and dark - practice relaxation or meditation daily  - call PCP with new concerns or questions - take all medications as directed Follow Up Plan: Telephone follow up appointment with care management team member scheduled for:08/14/20 @ 3:30pm      Follow Up:  Patient agrees to Care Plan and Follow-up.  Plan: The Managed Medicaid care management team will reach out to the patient again over the next 30 days.  Date/time of next scheduled RN care management/care coordination outreach:  08/14/20 @ 3:30pm  [name] RN, [place] RN Care Coordinator

## 2020-07-22 NOTE — Patient Instructions (Signed)
Visit Information  James [name] was given information about Medicaid Managed Care team care coordination services as a part of their Healthy [facility] Medicaid benefit. James [name] verbally consented to engagement with the [facility] Managed Care team.   For questions related to your Healthy [facility] health plan, please call: [phone] or visit the homepage here: [email or web address]  If you would like to schedule transportation through your Healthy [facility] plan, please call the following number at least 2 days in advance of your appointment: [phone]  Call the [place] at [phone], at any time, 24 hours a day, 7 days a week. If you are in danger or need immediate medical attention call 911.  If you would like help to quit smoking, call 1-800-QUIT-NOW 782-458-5363) OR Espaol: 1-855-Djelo-Ya (5-625-638-9373) o para ms informacin haga clic aqu or Text READY to 200-400 to register via text  James [name] - following are the goals we discussed in your visit today:   Goals Addressed             This Visit's Progress    Manage Fatigue (Tiredness-COPD)       Follow Up Date 08/14/20   - develop a new routine to improve sleep - get at least 7 to 8 hours of sleep at night - keep room cool and dark - practice relaxation or meditation daily    Why is this important?   Feeling tired or worn out is a common symptom of COPD (chronic obstructive pulmonary disease).  Learning when you feel your best and when you need rest is important.  Managing the tiredness (fatigue) will help you be active and enjoy life.             Please see education materials related to diabetes and COPD provided by MyChart link. and as [employer] account planner.   The patient verbalized understanding of instructions provided today and agreed to receive a mailed copy of patient instruction and/or educational materials.  Telephone follow up  appointment with Managed Medicaid care management team member scheduled for:08/14/20 @ 3:30pm  [name] RN, BSN [place] RN Care Coordinator   Following is a copy of your plan of care:  Patient Care Plan: COPD (Adult)     Problem Identified: Symptom Exacerbation (COPD)      Long-Range Goal: Symptom Exacerbation Prevented or Minimized   Start Date: 04/09/2020  Expected End Date: 08/18/2020  Recent Progress: On track  Priority: High  Note:   Current Barriers:  Knowledge deficits related to basic COPD self care/management-James [name] was recently admitted for COPD exacerbation and pnuemonia. He reports taking his medications, wearing oxygen continuously, and has a follow up with his PCP on 04/14/20. He does report having difficulty at times swallowing food and liquid, pain in back and legs, and difficulty falling asleep. He is planning to discuss these issues with his PCP. James [name] reports having transportation and plenty of food. He lives alone, and has support from his sister. James [name] attended his follow up with PCP. He was working outside at his home today and reports doing "about the same". He reports increased difficulty breathing when it is hot outside. He has his medications and oxygen, reports no needs at this time. He is working with [facility] pharmacist for medication management.Update-James [name] reports his breathing is about the same, worse with the warm temperatures. He needs two medications refilled and request RNCM to call him on Monday at 10am to assist in requesting  refills. He is aware of his PCP appointment on 7/28 and has transportation.  He has not been drinking alcohol and reports feeling better since quitting.-Update-RNCM called patient to assist with refilling needed medications. Patient reports needing furosemide and pantoprazole. 07/22/20-Today patient reports needing metformin 1040m to take twice daily. He has 12 pills left. This medication was  discontinued from his hospital discharge list. RNCM will collaborate with PCP for clarification/new prescription.  Does not contact provider office for questions/concerns  Case Manager Clinical Goal(s): patient will report using inhalers as prescribed including rinsing mouth after use patient will engage in lite exercise as tolerated to build/regain stamina and strength and reduce shortness of breath through activity tolerance patient will verbalize basic understanding of COPD disease process and self care activities patient will not be hospitalized for COPD exacerbation as evidenced  Patient will work with [facility] pharmacist for medication management-Met Patient will attend all scheduled appointments Interventions:  Inter-disciplinary care team collaboration (see longitudinal plan of care)  Encouraged to avoid being outside during the middle of the day when it is hot outside Reviewed upcoming scheduled appointments:PCP 7/28-RNCM will send printed copy of AVS Provided RNCM contact information for any healthcare related barriers RNCM collaborated with patient's pharmacy for medications. Patient picked up furosemide on 7/11 and should have 19 days left of this medication. Pantoprozole was d/c'd on last hospital admission. RNCM collaborated with PCP for new prescription of metformin. Patient Goals/Self-Care Activities:  - avoid outdoors during the warmest part of the day - avoid second hand smoke - identify and remove indoor air pollutants - limit outdoor activity during cold weather - listen for public air quality announcements every day  - take prescribed medications as directed - call PCP with new concerns or questions - develop a new routine to improve sleep - get at least 7 to 8 hours of sleep at night - keep room cool and dark - practice relaxation or meditation daily  - call PCP with new concerns or questions - take all medications as directed Follow Up Plan: Telephone follow up  appointment with care management team member scheduled for:08/14/20 @ 3:30pm     Patient Care Plan: Medication Management     Problem Identified: Health Promotion or Disease Self-Management (General Plan of Care)      Goal: Medication Management   Note:   Current Barriers:  Unable to independently monitor therapeutic efficacy Unable to self administer medications as prescribed Does not adhere to prescribed medication regimen Does not maintain contact with provider office Does not contact provider office for questions/concerns   Pharmacist Clinical Goal(s):  Over the next 30 days, patient will contact provider office for questions/concerns as evidenced notation of same in electronic health record through collaboration with PharmD and provider.    Interventions: Inter-disciplinary care team collaboration (see longitudinal plan of care) Comprehensive medication review performed; medication list updated in electronic medical record  @RXCPDIABETES @ @RXCPHYPERTENSION @ @RXCPHYPERLIPIDEMIA @ Health Maintenance  Patient Goals/Self-Care Activities Over the next 30 days, patient will:  - collaborate with provider on medication access solutions  Follow Up Plan: The care management team will reach out to the patient again over the next 30 days.       Patient Care Plan: Wellness (Adult)     Problem Identified: (RNCM) Alcohol Use (Wellness)      Long-Range Goal: Alcohol Use Managed   Start Date: 04/25/2020  Expected End Date: 08/25/2020  Recent Progress: On track  Priority: High  Note:   Current Barriers:  Ineffective Self  Health Maintenance-James [name] was admitted on 4/16-4/21. Patient reports it was because of the alcohol. He wants to quit and feels that he can stop drinking on his own.  Update-James [name] reports no alcohol and no desire to drink. He needs two medications refilled and request RNCM to assist him Monday at 10am. He is not at home currently and is unsure of which  two medications need refilled. Does not adhere to provider recommendations re: quit drinking alcohol Currently UNABLE TO independently self manage needs related to chronic health conditions.  Knowledge Deficits related to short term plan for care coordination needs and long term plans for chronic disease management needs Nurse Case Manager Clinical Goal(s):  patient will work with care management team to address care coordination and chronic disease management needs related to alcohol use   Interventions:  Evaluation of current treatment plan related to alcohol use and quitting and patient's adherence to plan as established by provider. Advised patient to avoid friends/family/situations that drinking is involved, consider counseling for quitting drinking, reach out to supportive friends/family Discussed plans with patient for ongoing care management follow up and provided patient with direct contact information for care management team Provided therapeutic listening Self Care Activities:  Patient will self administer medications as prescribed Patient will attend all scheduled provider appointments Patient will call pharmacy for medication refills Patient will call provider office for new concerns or questions Patient Goals: - avoid people and places where alcohol is used - develop a plan to avoid relapse  - think about counseling to assist in [place] - work with Ubaldo Glassing for therapy options Follow Up Plan: Telephone follow up appointment with care management team member scheduled for:07/21/20 @ 10am

## 2020-07-23 ENCOUNTER — Other Ambulatory Visit: Payer: Self-pay

## 2020-07-31 ENCOUNTER — Ambulatory Visit: Payer: Medicaid Other | Admitting: Critical Care Medicine

## 2020-08-02 IMAGING — DX DG ABDOMEN ACUTE W/ 1V CHEST
4 series · 4 of 4 positions shown · non-contrast
Comparison: Two view chest 11/09/2018.

CLINICAL DATA: Right flank pain. Shortness of breath.

EXAM:
DG ABDOMEN ACUTE W/ 1V CHEST

[chest pa]
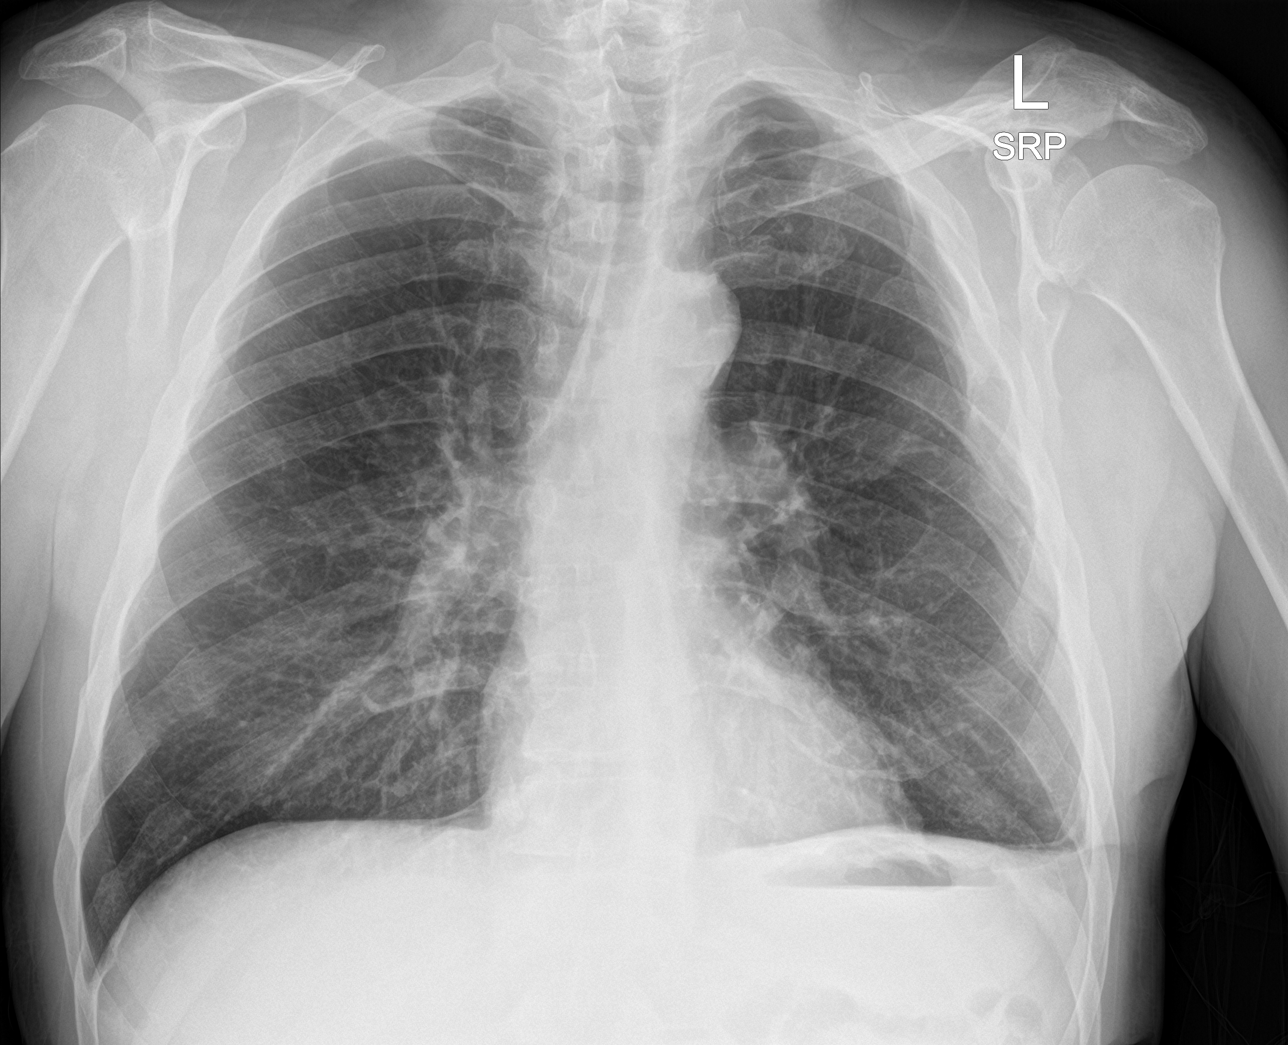

[abdomen erect]
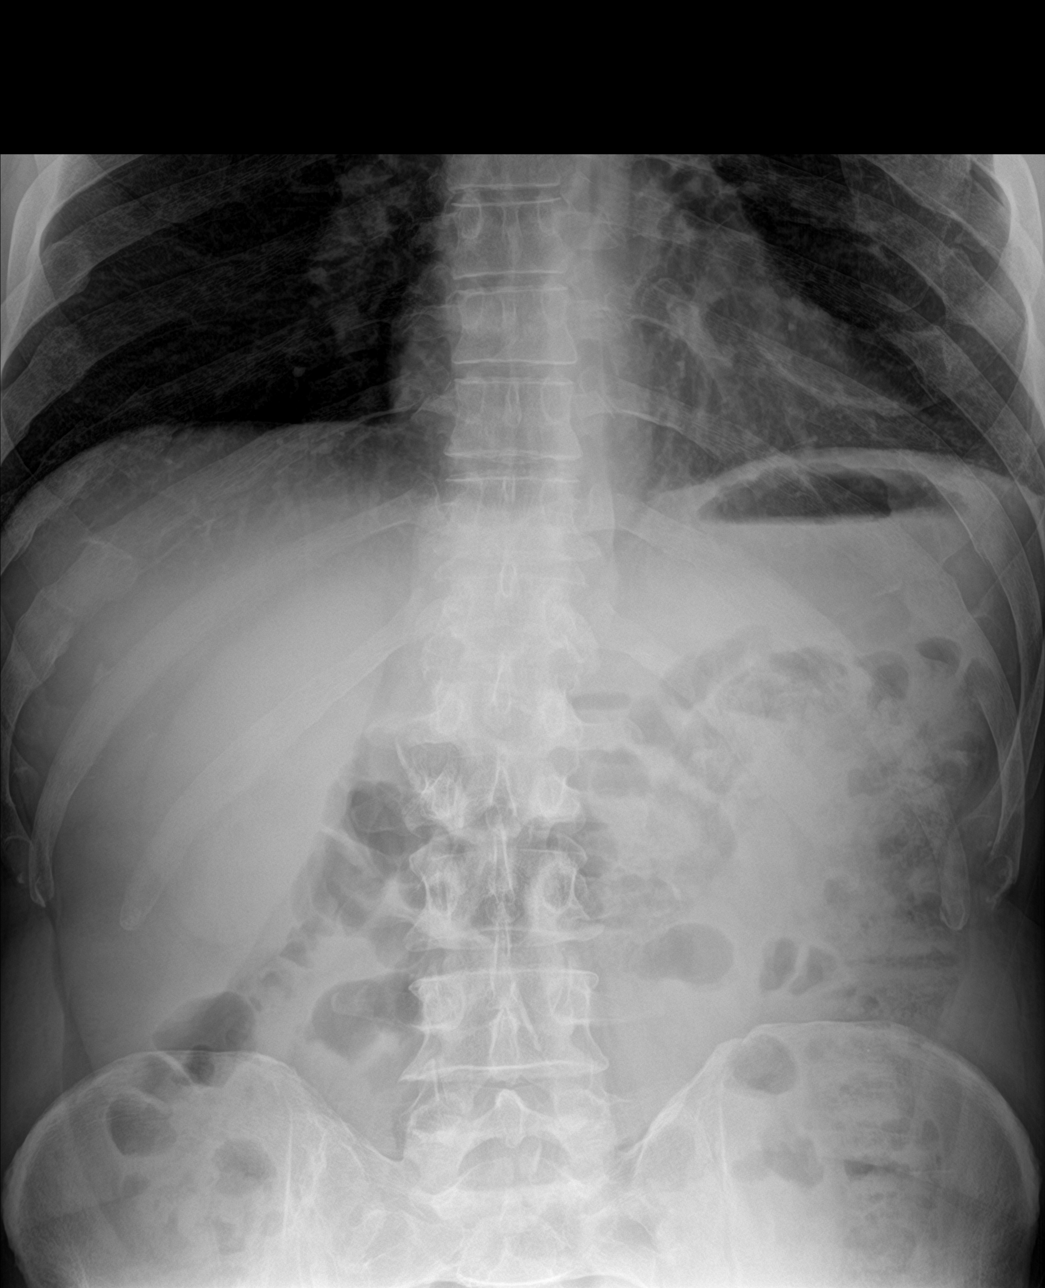

[abdomen supine (1 of 2)]
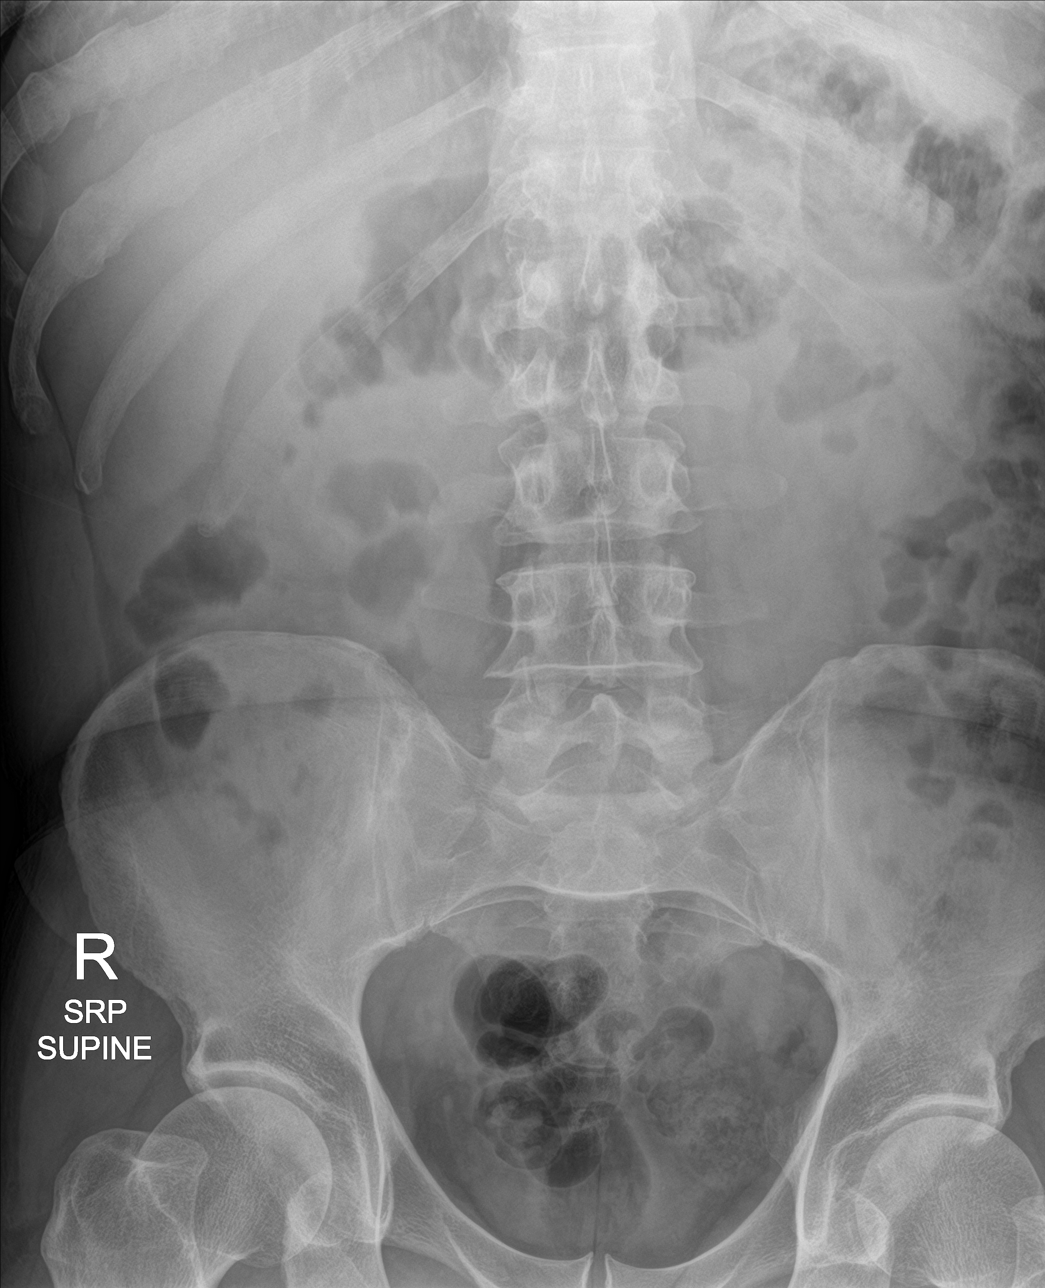

[abdomen supine (2 of 2)]
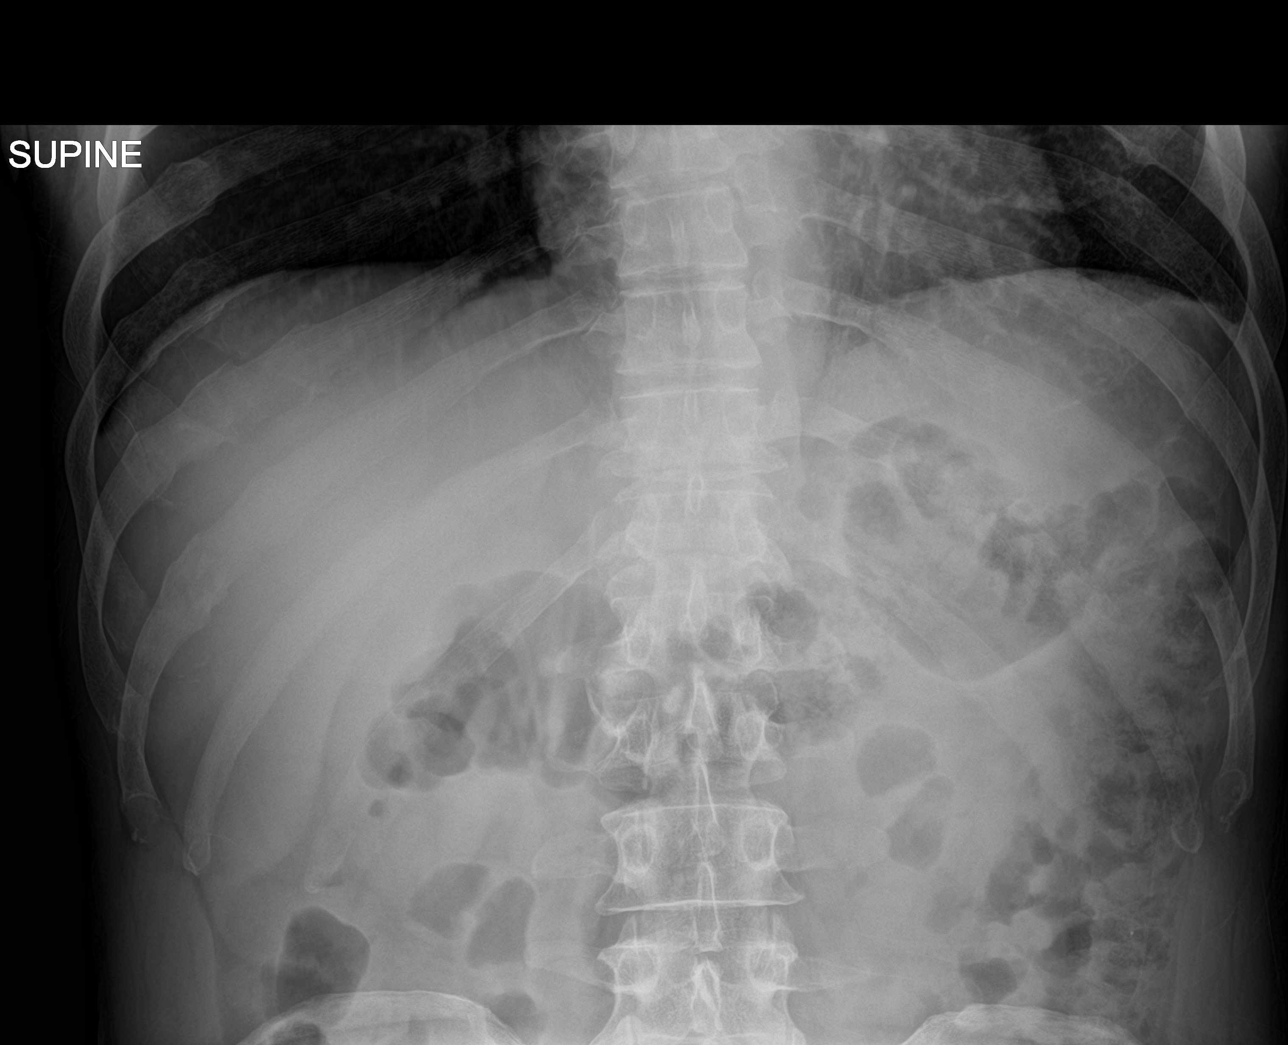

[4 of 4 positions shown; findings below may reference images not displayed]

FINDINGS: The lungs are clear without focal pneumonia, edema, pneumothorax or
pleural effusion. Stable scarring left costophrenic angle. The
cardiopericardial silhouette is within normal limits for size.
Bilateral nonacute rib fractures evident, left greater than right.

Upright film shows no evidence for intraperitoneal free air. There
is no evidence for gaseous bowel dilation to suggest obstruction. No
unexpected abdominopelvic calcification.
IMPRESSION: Negative abdominal radiographs. No acute cardiopulmonary disease.

## 2020-08-06 DIAGNOSIS — I10 Essential (primary) hypertension: Secondary | ICD-10-CM | POA: Diagnosis not present

## 2020-08-06 DIAGNOSIS — R062 Wheezing: Secondary | ICD-10-CM | POA: Diagnosis not present

## 2020-08-11 ENCOUNTER — Other Ambulatory Visit: Payer: Self-pay

## 2020-08-11 ENCOUNTER — Inpatient Hospital Stay (HOSPITAL_COMMUNITY)
Admission: EM | Admit: 2020-08-11 | Discharge: 2020-08-14 | DRG: 190 | Disposition: A | Discharge status: Home / Self Care | Payer: Medicaid Other | Attending: Family Medicine | Admitting: Family Medicine

## 2020-08-11 ENCOUNTER — Inpatient Hospital Stay (HOSPITAL_COMMUNITY): Payer: Medicaid Other

## 2020-08-11 ENCOUNTER — Encounter (HOSPITAL_COMMUNITY): Payer: Self-pay

## 2020-08-11 ENCOUNTER — Emergency Department (HOSPITAL_COMMUNITY): Payer: Medicaid Other

## 2020-08-11 DIAGNOSIS — G9341 Metabolic encephalopathy: Secondary | ICD-10-CM | POA: Diagnosis not present

## 2020-08-11 DIAGNOSIS — F1021 Alcohol dependence, in remission: Secondary | ICD-10-CM | POA: Diagnosis present

## 2020-08-11 DIAGNOSIS — Z20822 Contact with and (suspected) exposure to covid-19: Secondary | ICD-10-CM | POA: Diagnosis present

## 2020-08-11 DIAGNOSIS — J9622 Acute and chronic respiratory failure with hypercapnia: Secondary | ICD-10-CM | POA: Diagnosis present

## 2020-08-11 DIAGNOSIS — J181 Lobar pneumonia, unspecified organism: Secondary | ICD-10-CM | POA: Diagnosis present

## 2020-08-11 DIAGNOSIS — J9621 Acute and chronic respiratory failure with hypoxia: Secondary | ICD-10-CM | POA: Diagnosis present

## 2020-08-11 DIAGNOSIS — E875 Hyperkalemia: Secondary | ICD-10-CM | POA: Diagnosis not present

## 2020-08-11 DIAGNOSIS — Z8249 Family history of ischemic heart disease and other diseases of the circulatory system: Secondary | ICD-10-CM | POA: Diagnosis not present

## 2020-08-11 DIAGNOSIS — Z79899 Other long term (current) drug therapy: Secondary | ICD-10-CM

## 2020-08-11 DIAGNOSIS — Z888 Allergy status to other drugs, medicaments and biological substances status: Secondary | ICD-10-CM | POA: Diagnosis not present

## 2020-08-11 DIAGNOSIS — Z9981 Dependence on supplemental oxygen: Secondary | ICD-10-CM

## 2020-08-11 DIAGNOSIS — E119 Type 2 diabetes mellitus without complications: Secondary | ICD-10-CM | POA: Diagnosis present

## 2020-08-11 DIAGNOSIS — Z87891 Personal history of nicotine dependence: Secondary | ICD-10-CM

## 2020-08-11 DIAGNOSIS — J9811 Atelectasis: Secondary | ICD-10-CM | POA: Diagnosis present

## 2020-08-11 DIAGNOSIS — R410 Disorientation, unspecified: Secondary | ICD-10-CM | POA: Diagnosis not present

## 2020-08-11 DIAGNOSIS — R000 Tachycardia, unspecified: Secondary | ICD-10-CM | POA: Diagnosis not present

## 2020-08-11 DIAGNOSIS — Z7984 Long term (current) use of oral hypoglycemic drugs: Secondary | ICD-10-CM

## 2020-08-11 DIAGNOSIS — R0689 Other abnormalities of breathing: Secondary | ICD-10-CM | POA: Diagnosis not present

## 2020-08-11 DIAGNOSIS — R0902 Hypoxemia: Secondary | ICD-10-CM | POA: Diagnosis not present

## 2020-08-11 DIAGNOSIS — Z825 Family history of asthma and other chronic lower respiratory diseases: Secondary | ICD-10-CM | POA: Diagnosis not present

## 2020-08-11 DIAGNOSIS — Z8616 Personal history of COVID-19: Secondary | ICD-10-CM | POA: Diagnosis not present

## 2020-08-11 DIAGNOSIS — L03115 Cellulitis of right lower limb: Secondary | ICD-10-CM | POA: Diagnosis present

## 2020-08-11 DIAGNOSIS — R0789 Other chest pain: Secondary | ICD-10-CM | POA: Diagnosis not present

## 2020-08-11 DIAGNOSIS — J9601 Acute respiratory failure with hypoxia: Secondary | ICD-10-CM | POA: Diagnosis not present

## 2020-08-11 DIAGNOSIS — R079 Chest pain, unspecified: Secondary | ICD-10-CM | POA: Diagnosis not present

## 2020-08-11 DIAGNOSIS — I10 Essential (primary) hypertension: Secondary | ICD-10-CM | POA: Diagnosis present

## 2020-08-11 DIAGNOSIS — R0602 Shortness of breath: Secondary | ICD-10-CM | POA: Diagnosis not present

## 2020-08-11 DIAGNOSIS — J440 Chronic obstructive pulmonary disease with acute lower respiratory infection: Secondary | ICD-10-CM | POA: Diagnosis present

## 2020-08-11 DIAGNOSIS — Z7951 Long term (current) use of inhaled steroids: Secondary | ICD-10-CM | POA: Diagnosis not present

## 2020-08-11 DIAGNOSIS — R069 Unspecified abnormalities of breathing: Secondary | ICD-10-CM | POA: Diagnosis not present

## 2020-08-11 DIAGNOSIS — J441 Chronic obstructive pulmonary disease with (acute) exacerbation: Secondary | ICD-10-CM | POA: Diagnosis not present

## 2020-08-11 DIAGNOSIS — R059 Cough, unspecified: Secondary | ICD-10-CM | POA: Diagnosis not present

## 2020-08-11 LAB — RESPIRATORY PANEL BY PCR

## 2020-08-11 LAB — TROPONIN I (HIGH SENSITIVITY)
Troponin I (High Sensitivity): 13 ng/L (ref <18)
Troponin I (High Sensitivity): 29 ng/L — ABNORMAL HIGH (ref <18)

## 2020-08-11 LAB — RESP PANEL BY RT-PCR (FLU A&B, COVID) ARPGX2
Influenza A by PCR: NEGATIVE
Influenza B by PCR: NEGATIVE
SARS Coronavirus 2 by RT PCR: NEGATIVE

## 2020-08-11 LAB — CBC WITH DIFFERENTIAL/PLATELET
Abs Immature Granulocytes: 0.14 10*3/uL — ABNORMAL HIGH (ref 0.00–0.07)
Basophils Absolute: 0 10*3/uL (ref 0.0–0.1)
Basophils Relative: 0 %
Eosinophils Absolute: 0 10*3/uL (ref 0.0–0.5)
Eosinophils Relative: 0 %
HCT: 38.3 % — ABNORMAL LOW (ref 39.0–52.0)
Hemoglobin: 12.1 g/dL — ABNORMAL LOW (ref 13.0–17.0)
Immature Granulocytes: 1 %
Lymphocytes Relative: 1 %
Lymphs Abs: 0.3 10*3/uL — ABNORMAL LOW (ref 0.7–4.0)
MCH: 29.4 pg (ref 26.0–34.0)
MCHC: 31.6 g/dL (ref 30.0–36.0)
MCV: 93.2 fL (ref 80.0–100.0)
Monocytes Absolute: 1.3 10*3/uL — ABNORMAL HIGH (ref 0.1–1.0)
Monocytes Relative: 6 %
Neutro Abs: 20.6 10*3/uL — ABNORMAL HIGH (ref 1.7–7.7)
Neutrophils Relative %: 92 %
Platelets: 293 10*3/uL (ref 150–400)
RBC: 4.11 MIL/uL — ABNORMAL LOW (ref 4.22–5.81)
RDW: 13.4 % (ref 11.5–15.5)
WBC: 22.3 10*3/uL — ABNORMAL HIGH (ref 4.0–10.5)
nRBC: 0 % (ref 0.0–0.2)

## 2020-08-11 LAB — I-STAT VENOUS BLOOD GAS, ED
Acid-Base Excess: 1 mmol/L (ref 0.0–2.0)
Acid-Base Excess: 4 mmol/L — ABNORMAL HIGH (ref 0.0–2.0)
Bicarbonate: 29.9 mmol/L — ABNORMAL HIGH (ref 20.0–28.0)
Bicarbonate: 30.8 mmol/L — ABNORMAL HIGH (ref 20.0–28.0)
Calcium, Ion: 1.18 mmol/L (ref 1.15–1.40)
Calcium, Ion: 1.15 mmol/L (ref 1.15–1.40)
HCT: 44 % (ref 39.0–52.0)
HCT: 39 % (ref 39.0–52.0)
Hemoglobin: 15 g/dL (ref 13.0–17.0)
Hemoglobin: 13.3 g/dL (ref 13.0–17.0)
O2 Saturation: 73 %
O2 Saturation: 99 %
Potassium: 4 mmol/L (ref 3.5–5.1)
Potassium: 5.3 mmol/L — ABNORMAL HIGH (ref 3.5–5.1)
Sodium: 141 mmol/L (ref 135–145)
Sodium: 139 mmol/L (ref 135–145)
TCO2: 32 mmol/L (ref 22–32)
TCO2: 32 mmol/L (ref 22–32)
pCO2, Ven: 62.9 mmHg — ABNORMAL HIGH (ref 44.0–60.0)
pCO2, Ven: 54.2 mmHg (ref 44.0–60.0)
pH, Ven: 7.286 (ref 7.250–7.430)
pH, Ven: 7.363 (ref 7.250–7.430)
pO2, Ven: 45 mmHg (ref 32.0–45.0)
pO2, Ven: 150 mmHg — ABNORMAL HIGH (ref 32.0–45.0)

## 2020-08-11 LAB — BASIC METABOLIC PANEL
Anion gap: 8 (ref 5–15)
BUN: 13 mg/dL (ref 6–20)
CO2: 29 mmol/L (ref 22–32)
Calcium: 9.4 mg/dL (ref 8.9–10.3)
Chloride: 104 mmol/L (ref 98–111)
Creatinine, Ser: 0.92 mg/dL (ref 0.61–1.24)
GFR, Estimated: >60 mL/min (ref >60)
Glucose, Bld: 159 mg/dL — ABNORMAL HIGH (ref 70–99)
Potassium: 5.4 mmol/L — ABNORMAL HIGH (ref 3.5–5.1)
Sodium: 141 mmol/L (ref 135–145)

## 2020-08-11 LAB — CBG MONITORING, ED: Glucose-Capillary: 180 mg/dL — ABNORMAL HIGH (ref 70–99)

## 2020-08-11 LAB — LACTIC ACID, PLASMA: Lactic Acid, Venous: 2.7 mmol/L (ref 0.5–1.9)

## 2020-08-11 MED ORDER — ACETAMINOPHEN 650 MG RE SUPP: 650.0000 mg | Freq: Four times a day (QID) | RECTAL | Status: DC | PRN

## 2020-08-11 MED ORDER — IPRATROPIUM BROMIDE 0.02 % IN SOLN: 0.5000 mg | Freq: Four times a day (QID) | RESPIRATORY_TRACT | Status: DC

## 2020-08-11 MED ORDER — ACETAMINOPHEN 650 MG RE SUPP
650.0000 mg | Freq: Once | RECTAL | Status: AC
  Administered 2020-08-11: 650 mg via RECTAL
  Filled 2020-08-11: qty 1

## 2020-08-11 MED ORDER — IPRATROPIUM BROMIDE 0.02 % IN SOLN
0.5000 mg | Freq: Once | RESPIRATORY_TRACT | Status: AC
  Administered 2020-08-11: 0.5 mg via RESPIRATORY_TRACT
  Filled 2020-08-11: qty 2.5

## 2020-08-11 MED ORDER — ALBUTEROL (5 MG/ML) CONTINUOUS INHALATION SOLN
INHALATION_SOLUTION | RESPIRATORY_TRACT | Status: AC
  Filled 2020-08-11: qty 20

## 2020-08-11 MED ORDER — POLYETHYLENE GLYCOL 3350 17 G PO PACK: 17.0000 g | PACK | Freq: Every day | ORAL | Status: DC | PRN

## 2020-08-11 MED ORDER — IPRATROPIUM BROMIDE 0.02 % IN SOLN
0.5000 mg | Freq: Four times a day (QID) | RESPIRATORY_TRACT | Status: DC
  Administered 2020-08-11 – 2020-08-12 (×4): 0.5 mg via RESPIRATORY_TRACT
  Filled 2020-08-11 (×4): qty 2.5

## 2020-08-11 MED ORDER — PREDNISONE 20 MG PO TABS
40.0000 mg | ORAL_TABLET | Freq: Every day | ORAL | Status: DC
  Administered 2020-08-13: 40 mg via ORAL
  Filled 2020-08-11: qty 2

## 2020-08-11 MED ORDER — LACTATED RINGERS IV BOLUS
1000.0000 mL | Freq: Once | INTRAVENOUS | Status: AC
  Administered 2020-08-11: 1000 mL via INTRAVENOUS

## 2020-08-11 MED ORDER — ONDANSETRON HCL 4 MG PO TABS: 4.0000 mg | ORAL_TABLET | Freq: Four times a day (QID) | ORAL | Status: DC | PRN

## 2020-08-11 MED ORDER — INSULIN ASPART 100 UNIT/ML IJ SOLN
0.0000 [IU] | Freq: Three times a day (TID) | INTRAMUSCULAR | Status: DC
  Administered 2020-08-12: 3 [IU] via SUBCUTANEOUS

## 2020-08-11 MED ORDER — HYDROCODONE-ACETAMINOPHEN 5-325 MG PO TABS: 1.0000 | ORAL_TABLET | ORAL | Status: DC | PRN

## 2020-08-11 MED ORDER — UMECLIDINIUM-VILANTEROL 62.5-25 MCG/INH IN AEPB
1.0000 | INHALATION_SPRAY | Freq: Every day | RESPIRATORY_TRACT | Status: DC
  Administered 2020-08-12 – 2020-08-13 (×2): 1 via RESPIRATORY_TRACT
  Filled 2020-08-11 (×2): qty 14

## 2020-08-11 MED ORDER — SODIUM ZIRCONIUM CYCLOSILICATE 10 G PO PACK: 10.0000 g | PACK | Freq: Once | ORAL | Status: DC

## 2020-08-11 MED ORDER — CEFTRIAXONE SODIUM 1 G IJ SOLR
1.0000 g | INTRAMUSCULAR | Status: DC
  Administered 2020-08-12 – 2020-08-13 (×2): 1 g via INTRAVENOUS
  Filled 2020-08-11: qty 1
  Filled 2020-08-11 (×2): qty 10

## 2020-08-11 MED ORDER — SODIUM CHLORIDE 0.9 % IV SOLN
1.0000 g | Freq: Once | INTRAVENOUS | Status: AC
  Administered 2020-08-11: 1 g via INTRAVENOUS
  Filled 2020-08-11: qty 10

## 2020-08-11 MED ORDER — MAGNESIUM SULFATE 2 GM/50ML IV SOLN
2.0000 g | Freq: Once | INTRAVENOUS | Status: AC
  Administered 2020-08-11: 2 g via INTRAVENOUS
  Filled 2020-08-11: qty 50

## 2020-08-11 MED ORDER — MORPHINE SULFATE (PF) 2 MG/ML IV SOLN: 2.0000 mg | INTRAVENOUS | Status: DC | PRN

## 2020-08-11 MED ORDER — ACETAMINOPHEN 325 MG PO TABS
650.0000 mg | ORAL_TABLET | Freq: Four times a day (QID) | ORAL | Status: DC | PRN
  Administered 2020-08-13: 650 mg via ORAL
  Filled 2020-08-11: qty 2

## 2020-08-11 MED ORDER — INSULIN ASPART 100 UNIT/ML IJ SOLN: 0.0000 [IU] | Freq: Every day | INTRAMUSCULAR | Status: DC

## 2020-08-11 MED ORDER — ALBUTEROL (5 MG/ML) CONTINUOUS INHALATION SOLN
10.0000 mg/h | INHALATION_SOLUTION | RESPIRATORY_TRACT | Status: DC
  Administered 2020-08-11: 10 mg/h via RESPIRATORY_TRACT
  Filled 2020-08-11: qty 20

## 2020-08-11 MED ORDER — METHYLPREDNISOLONE SODIUM SUCC 40 MG IJ SOLR
40.0000 mg | Freq: Two times a day (BID) | INTRAMUSCULAR | Status: AC
Start: 2020-08-11 — End: 2020-08-12
  Administered 2020-08-11 – 2020-08-12 (×2): 40 mg via INTRAVENOUS
  Filled 2020-08-11 (×2): qty 1

## 2020-08-11 MED ORDER — ONDANSETRON HCL 4 MG/2ML IJ SOLN: 4.0000 mg | Freq: Four times a day (QID) | INTRAMUSCULAR | Status: DC | PRN

## 2020-08-11 MED ORDER — GUAIFENESIN ER 600 MG PO TB12
600.0000 mg | ORAL_TABLET | Freq: Two times a day (BID) | ORAL | Status: DC
  Administered 2020-08-11 – 2020-08-13 (×5): 600 mg via ORAL
  Filled 2020-08-11 (×5): qty 1

## 2020-08-11 MED ORDER — SODIUM CHLORIDE 0.9 % IV SOLN
500.0000 mg | INTRAVENOUS | Status: DC
  Administered 2020-08-11 – 2020-08-13 (×3): 500 mg via INTRAVENOUS
  Filled 2020-08-11 (×4): qty 500

## 2020-08-11 NOTE — ED Notes (Signed)
Lactic  2.7

## 2020-08-11 NOTE — H&P (Signed)
History and Physical    James [name] DGU:[id number] DOB: 12-Nov-1960 DOA: 08/11/2020  PCP: [name], MD  Chief Complaint: Shortness of breath  HPI: James [name] is a 60 y.o. male with a past medical history of COPD with chronic hypoxic respiratory failure on 2 L oxygen at baseline, hypertension, former tobacco use, former alcohol dependence, diabetes mellitus.  The patient presented to the emergency department with sudden onset of shortness of breath that awakened him in the middle the night.  Had significant wheezing and dry cough.  When EMS arrived he was saturating 80%.  Unclear if this was on his baseline oxygen or on room air.  In the emergency department he was noted to be in respiratory distress with tachypnea and tach, tachycardia and increased work of breathing.  Was initially placed on BiPAP.  Given continuous breathing treatments and IV steroids as well as antibiotics, fluids and magnesium sulfate.  Was transitioned off BiPAP to 4 L nasal cannula.  Upon my evaluation he is sleeping but awakens to me talking to him.  Still noted to be mildly tachypneic in the mid to upper 20s.  He is also having some belly breathing and appears a little confused with taking too long to answer my questions.  Some mild conversational dyspnea as well.  I have instructed RT to place him back on BiPAP.  ED Course: Chest x-ray, EKG, flu A/B PCR, COVID-19 PCR, troponins, lactic acid, VBG, CMP, BMP.  Tylenol rectally 650 mg x 1, continuous albuterol nebulizer treatment, Zithromax 500 mg IV, ceftriaxone 1 g IV x1, lactated Ringer 1 L bolus x2, magnesium sulfate 2 g IV x1.  Review of Systems: 14 point review of systems is negative except for what is mentioned above in the HPI.   Past Medical History:  Diagnosis Date   Acute on chronic respiratory failure with hypoxia ([place]) 06/08/2013   CAP (community acquired pneumonia) 05/17/2018   See admit 05/17/18 ? rml  with   covid pcr neg - rx augmentin > f/u cxr  in 4-6 weeks is fine unless condition declines    Community acquired pneumonia of right lower lobe of lung 05/17/2018   See admit 05/17/18 ? 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   Tick bite 06/03/2018    Past Surgical History:  Procedure Laterality Date   VIDEO ASSISTED THORACOSCOPY (VATS)/THOROCOTOMY Left 06/11/2013   Procedure: VIDEO ASSISTED THORACOSCOPY (VATS)/THOROCOTOMY;  Surgeon: Ivin Poot, MD;  Location: [facility];  Service: Thoracic;  Laterality: Left;   VIDEO BRONCHOSCOPY N/A 06/11/2013   Procedure: VIDEO BRONCHOSCOPY;  Surgeon: Ivin Poot, MD;  Location: [place] [facility];  Service: Thoracic;  Laterality: N/A;   VIDEO BRONCHOSCOPY N/A 08/10/2018   Procedure: VIDEO BRONCHOSCOPY WITH FLUORO;  Surgeon: Candee Furbish, MD;  Location: [facility] ENDOSCOPY;  Service: Endoscopy;  Laterality: N/A;    Social History   Socioeconomic History   Marital status: Divorced    Spouse name: Not on file   Number of children: Not on file   Years of education: Not on file   Highest education level:  Not on file  Occupational History   Occupation: Dealer   Occupation: Painter  Tobacco Use   Smoking status: Former    Packs/day: 1.50    Years: 40.00    Pack years: 60.00    Types: Cigarettes    Quit date: 2014    Years since quitting: 8.6   Smokeless tobacco: Former    Types: Nurse, children's Use: Never used  Substance and Sexual Activity   Alcohol use: Yes    Alcohol/week: 28.0 standard drinks    Types: 28 Cans of beer per week    Comment: 4 beers a night; + jitters with not drinking, denies DTs or seizures   Drug use: Yes    Types: Marijuana    Comment: daily; quit  using crack and methamphetamine about 5 months ago    Sexual activity: Not Currently    Partners: Female  Other Topics Concern   Not on file  Social History Narrative   Lives alone near [place] Determinants of Health   Financial Resource Strain: Not on file  Food Insecurity: Not on file  Transportation Needs: Not on file  Physical Activity: Not on file  Stress: Not on file  Social Connections: Not on file  Intimate Partner Violence: Not on file    Allergies  Allergen Reactions   Gabapentin Other (See Comments)    hallucinations    Family History  Problem Relation Age of Onset   Heart disease Father    COPD Sister     Prior to Admission medications   Medication Sig Start Date End Date Taking? Authorizing Provider  albuterol (PROVENTIL) (2.5 MG/3ML) 0.083% nebulizer solution TAKE 3 MLS (2.5 MG TOTAL) BY NEBULIZATION IN THE MORNING, AT NOON, AND AT BEDTIME. Patient not taking: Reported on 08/11/2020 02/13/20 02/12/21  [name], MD  albuterol (PROVENTIL) (2.5 MG/3ML) 0.083% nebulizer solution Take 3 mLs (2.5 mg total) by nebulization every 6 (six) hours as needed for wheezing or shortness of breath. 06/10/20   [name], MD  albuterol (VENTOLIN HFA) 108 (90 Base) MCG/ACT inhaler Inhale 2 puffs into the lungs every 4 (four) hours as needed for wheezing or shortness of breath. 11/16/19   Ward, Delice Bison, DO  budesonide-formoterol (SYMBICORT) 160-4.5 MCG/ACT inhaler INHALE 2 PUFFS INTO THE LUNGS 2 (TWO) TIMES DAILY. 06/10/20 06/10/21  [name], MD  furosemide (LASIX) 20 MG tablet Take 1 tablet (20 mg total) by mouth daily as needed for edema. 06/10/20 06/10/21  [name], MD  guaiFENesin (MUCINEX) 600 MG 12 hr tablet Take 1 tablet (600 mg total) by mouth 2 (two) times daily as needed for cough or to loosen phlegm. 06/29/20 06/29/21  [name], MD  losartan (COZAAR) 100 MG tablet Take 1 tablet (100 mg total) by mouth daily. 05/26/20 05/26/21  [name], MD  metFORMIN (GLUCOPHAGE) 1000 MG tablet Take 1 tablet (1,000 mg total) by mouth 2 (two) times daily with a meal. 07/22/20   [name], MD  metoprolol succinate (TOPROL-XL) 25 MG 24 hr tablet Take 1 tablet (25 mg total) by mouth daily. 06/10/20 06/10/21  [name], MD  pantoprazole (PROTONIX) 40 MG tablet Take 1 tablet (40 mg total) by mouth daily. 07/21/20   [name], MD  predniSONE (DELTASONE) 10 MG tablet Take 1 tablet (10 mg total) by mouth daily with breakfast. 07/09/20   [name], MD  predniSONE (DELTASONE) 10 MG tablet Take  1 tablet (10 mg total) by mouth daily with breakfast. Patient not taking: Reported on 08/11/2020 07/09/20   [name], MD  predniSONE (DELTASONE) 20 MG tablet Take 40 mg (2 tablets) on 6/27, then 20 mg (1 tablet) from 6/28 to 7/1, then back to your home dose of 10 mg daily. 06/29/20   [name], MD  Respiratory Therapy Supplies (FLUTTER) DEVI Use 4 times daily 04/27/18   [name], MD  Tiotropium Bromide Monohydrate 2.5 MCG/ACT AERS INHALE 2 PUFFS INTO THE LUNGS DAILY. 06/10/20 06/10/21  [name], MD    Physical Exam: Vitals:   08/11/20 1230 08/11/20 1245 08/11/20 1505 08/11/20 1615  BP: 118/62 123/62  (!) 147/80  Pulse: (!) 108 (!) 108 91 89  Resp: (!) 22 (!) 21 (!) 22 (!) 22  Temp:      TempSrc:      SpO2: 100% 100% 100% 100%     General: Moderate respiratory distress Cardiovascular:  RRR, no m/r/g.  Respiratory:   Expiratory wheezing in the upper lobes, diminished breath sounds in the lower lobes bilaterally, respiratory effort with mild tachypnea and belly breathing. Abdomen:  soft, NT, ND, NABS Skin:  no rash or induration seen on limited exam Musculoskeletal:  grossly normal tone BUE/BLE, good ROM, no bony abnormality Lower extremity:  No LE edema.  Limited foot exam with no ulcerations.  2+ distal pulses. Psychiatric:  grossly normal mood and affect, speech fluent and appropriate, AOx3 mild  confusion Neurologic:  CN 2-12 grossly intact, moves all extremities in coordinated fashion, sensation intact    Radiological Exams on Admission: Independently reviewed - see discussion in A/P where applicable  CT HEAD WO CONTRAST (5MM)  Result Date: 08/11/2020 CLINICAL DATA:  [place] EXAM: CT HEAD WITHOUT CONTRAST TECHNIQUE: Contiguous axial images were obtained from the base of the skull through the vertex without intravenous contrast. COMPARISON:  06/16/2013 FINDINGS: Brain: No evidence of acute infarction, hemorrhage, hydrocephalus, extra-axial collection or mass lesion/mass effect. Vascular: Atherosclerotic calcifications involving the large vessels of the skull base. No unexpected hyperdense vessel. Skull: Normal. Negative for fracture or focal lesion. Sinuses/Orbits: Left greater than right mastoid effusions. Paranasal sinuses are clear. Orbital structures unremarkable. Other: None. IMPRESSION: 1. No acute intracranial findings. 2. Left greater than right mastoid effusions. Electronically Signed   By: Davina Poke D.O.   On: 08/11/2020 17:29   DG Chest Portable 1 View  Result Date: 08/11/2020 CLINICAL DATA:  Shortness of breath, cough. EXAM: PORTABLE CHEST 1 VIEW COMPARISON:  June 26, 2020. FINDINGS: The heart size and mediastinal contours are within normal limits. Minimal bibasilar subsegmental atelectasis or scarring is noted. Old left rib fractures are noted. IMPRESSION: Minimal bibasilar subsegmental atelectasis or scarring. Electronically Signed   By: Marijo Conception M.D.   On: 08/11/2020 09:37    EKG: Independently reviewed.  Sinus tach rate 147   Labs on Admission: I have personally reviewed the available labs and imaging studies at the time of the admission.  Pertinent labs: Lactic acid 2.7, WBC 22.3, hemoglobin 12.1, potassium 5.4, blood glucose 159, PCO2 on VBG 62   Assessment/Plan: Acute on chronic hypoxic hypercarbic respiratory failure secondary to right lower lobe  pneumonia and COPD exacerbation: The patient will be admitted to the medical/surgical floor under inpatient status.  He is on 2 L oxygen at baseline.  Was initially placed on BiPAP by the ED provider and subsequently titrated off to 4 L nasal cannula.  Upon my evaluation he is still  in some moderate respiratory distress with mild tachypnea and increased daily breathing.  Does still appear to be mildly confused upon my exam.  We will place him back on BiPAP.  We will repeat VBG.  Start IV steroids, antibiotics with ceftriaxone and Zithromax.  Start scheduled duo nebs and as needed.  Chest PT, ICS and flutter valve.  Wean oxygen as tolerated.  Acute metabolic encephalopathy: Obtain a CT head no contrast.  Likely secondary to due to CO2 narcosis versus sepsis.  Hyperkalemia: Initial K 5.4.  Ordered potassium binder with Lokelma x1 dose.  Sepsis secondary to right lower lobe pneumonia: The patient meets sepsis criteria with leukocytosis, tachypnea and tachycardia.  Plan as above.  Other chronic conditions: hypertension, COPD with chronic hypoxic respiratory failure on 2 L oxygen at baseline, diabetes mellitus: Resume home medications once home med rec has been completed by pharmacy.  Level of Care: MedSurg DVT prophylaxis: Lovenox subcu Code Status: Full code Consults: None Admission status: Inpatient   Leslee Home DO [facility]   How to contact the [facility] Attending or Consulting provider [place] or covering provider during after hours [place], for this patient?  Check the care team in [facility] and look for a) attending/consulting TR[facility] provider listed and b) the [facility] team listed Log into [email or web address] and use [place]'s universal password to access. If you do not have the password, please contact the hospital operator. Locate the [facility] provider you are looking for under [facility] and page to a number that you can be directly reached. If you still have difficulty reaching the provider,  please page the [facility] (Director on Call) for the Hospitalists listed on amion for assistance.   08/11/2020, 5:56 PM

## 2020-08-11 NOTE — ED Provider Notes (Signed)
[facility] EMERGENCY DEPARTMENT Provider Note   CSN: [id number] Arrival date & time: 08/11/20  8469     History Chief Complaint  Patient presents with   Respiratory Distress    James [name] is a 60 y.o. male.  HPI   60 yo M with a PMHx of chronic hypoxic respiratory failure on 2L , CAP, COPD, HTN presenting to the ED via EMS for respiratory distress.  Patient reports that he wears 2 L of oxygen at home, but EMS report that on arrival he was saturating 80% on room air.  Patient states that he woke up this morning with a fever around 101 F, a cough, and increased work of breathing.  EMS applied continuous positive airway pressure, gave 125 mg of Solu-Medrol, and an albuterol treatment without significant improvement.  Currently in the ED, patient reporting continued shortness of breath, severe.  He denies any recent nausea, vomiting, diarrhea.  He reports diffuse chest pressure associated with his wheezing.  He denies any recent leg swelling.  He states that he has been adherent with his inhaler regimen at home. He states he has not drank EtOH in at least 3 months.   Past Medical History:  Diagnosis Date   Acute on chronic respiratory failure with hypoxia ([place]) 06/08/2013   CAP (community acquired pneumonia) 05/17/2018   See admit 05/17/18 ? rml  with   covid pcr neg - rx augmentin > f/u cxr in 4-6 weeks is fine unless condition declines    Community acquired pneumonia of right lower lobe of lung 05/17/2018   See admit 05/17/18 ? 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   Tick bite 06/03/2018    Patient Active Problem List   Diagnosis Date Noted    CAP (community acquired pneumonia) 06/26/2020   Palliative care encounter 05/26/2020   Alcohol withdrawal syndrome, with delirium ([place])    Obesity (BMI 30.0-34.9) 04/19/2020   Pain of right forearm 12/03/2019   Bronchiectasis ([place]) 09/24/2019   Type 2 diabetes mellitus without complication, without long-term current use of insulin ([place]) 07/24/2019   Other atopic dermatitis 05/01/2019   Chronic respiratory failure with hypoxia ([place]) 04/23/2019   Intellectual disability 04/23/2019   History of alcohol use 11/23/2018   Hypertension 05/17/2016   History of tobacco abuse 04/06/2016   COPD mixed type ([place]) 03/26/2016   Past Surgical History:  Procedure Laterality Date   VIDEO ASSISTED THORACOSCOPY (VATS)/THOROCOTOMY Left 06/11/2013   Procedure: VIDEO ASSISTED THORACOSCOPY (VATS)/THOROCOTOMY;  Surgeon: Ivin Poot, MD;  Location: [place];  Service: Thoracic;  Laterality: Left;   VIDEO BRONCHOSCOPY N/A 06/11/2013   Procedure: VIDEO BRONCHOSCOPY;  Surgeon: Ivin Poot, MD;  Location: [place];  Service: Thoracic;  Laterality: N/A;   VIDEO BRONCHOSCOPY N/A 08/10/2018   Procedure: VIDEO BRONCHOSCOPY WITH FLUORO;  Surgeon: Candee Furbish, MD;  Location: [facility] ENDOSCOPY;  Service: Endoscopy;  Laterality: N/A;    Family History  Problem Relation Age of Onset   Heart disease Father    COPD Sister    Social History   Tobacco Use  Smoking status: Former    Packs/day: 1.50    Years: 40.00    Pack years: 60.00    Types: Cigarettes    Quit date: 2014    Years since quitting: 8.6   Smokeless tobacco: Former    Types: Nurse, children's Use: Never used  Substance Use Topics   Alcohol use: Yes    Alcohol/week: 28.0 standard drinks    Types: 28 Cans of beer per week    Comment: 4 beers a night; + jitters with not drinking, denies DTs or seizures   Drug use: Yes    Types: Marijuana    Comment: daily; quit using crack and methamphetamine about 5 months ago     Home Medications Prior to  Admission medications   Medication Sig Start Date End Date Taking? Authorizing Provider  albuterol (PROVENTIL) (2.5 MG/3ML) 0.083% nebulizer solution TAKE 3 MLS (2.5 MG TOTAL) BY NEBULIZATION IN THE MORNING, AT NOON, AND AT BEDTIME. Patient taking differently: Take 2.5 mg by nebulization daily. 02/13/20 02/12/21  [name], MD  albuterol (PROVENTIL) (2.5 MG/3ML) 0.083% nebulizer solution Take 3 mLs (2.5 mg total) by nebulization every 6 (six) hours as needed for wheezing or shortness of breath. 06/10/20   [name], MD  albuterol (VENTOLIN HFA) 108 (90 Base) MCG/ACT inhaler Inhale 2 puffs into the lungs every 4 (four) hours as needed for wheezing or shortness of breath. 11/16/19   Ward, Delice Bison, DO  budesonide-formoterol (SYMBICORT) 160-4.5 MCG/ACT inhaler INHALE 2 PUFFS INTO THE LUNGS 2 (TWO) TIMES DAILY. 06/10/20 06/10/21  [name], MD  furosemide (LASIX) 20 MG tablet Take 1 tablet (20 mg total) by mouth daily as needed for edema. 06/10/20 06/10/21  [name], MD  guaiFENesin (MUCINEX) 600 MG 12 hr tablet Take 1 tablet (600 mg total) by mouth 2 (two) times daily as needed for cough or to loosen phlegm. 06/29/20 06/29/21  [name], MD  losartan (COZAAR) 100 MG tablet Take 1 tablet (100 mg total) by mouth daily. 05/26/20 05/26/21  [name], MD  metFORMIN (GLUCOPHAGE) 1000 MG tablet Take 1 tablet (1,000 mg total) by mouth 2 (two) times daily with a meal. 07/22/20   [name], MD  metoprolol succinate (TOPROL-XL) 25 MG 24 hr tablet Take 1 tablet (25 mg total) by mouth daily. 06/10/20 06/10/21  [name], MD  pantoprazole (PROTONIX) 40 MG tablet Take 1 tablet (40 mg total) by mouth daily. 07/21/20   [name], MD  predniSONE (DELTASONE) 10 MG tablet Take 1 tablet (10 mg total) by mouth daily with breakfast. 07/09/20   [name], MD  predniSONE (DELTASONE) 10 MG tablet Take 1 tablet (10 mg total) by mouth daily with breakfast. 07/09/20   [name],  MD  predniSONE (DELTASONE) 20 MG tablet Take 40 mg (2 tablets) on 6/27, then 20 mg (1 tablet) from 6/28 to 7/1, then back to your home dose of 10 mg daily. 06/29/20   [name], MD  Respiratory Therapy Supplies (FLUTTER) DEVI Use 4 times daily 04/27/18   [name], MD  Tiotropium Bromide Monohydrate 2.5 MCG/ACT AERS INHALE 2 PUFFS INTO THE LUNGS DAILY. 06/10/20 06/10/21  [name], MD    Allergies    Gabapentin  Review of Systems   Review of Systems  Unable to perform ROS: Acuity of condition   Physical Exam Updated Vital Signs BP (!) 142/84   Pulse (!) 117  Temp 100.1 F (37.8 C) (Axillary)   Resp (!) 27   SpO2 100%   Physical Exam Vitals and nursing note reviewed.  Constitutional:      General: He is in acute distress.     Appearance: He is ill-appearing. He is not toxic-appearing.  HENT:     Head: Normocephalic and atraumatic.     Nose: Nose normal. No congestion or rhinorrhea.     Mouth/Throat:     Mouth: Mucous membranes are moist.     Pharynx: Oropharynx is clear. No oropharyngeal exudate or posterior oropharyngeal erythema.  Eyes:     Extraocular Movements: Extraocular movements intact.     Pupils: Pupils are equal, round, and reactive to light.  Cardiovascular:     Rate and Rhythm: Regular rhythm. Tachycardia present.  Pulmonary:     Comments: Tachypneic.  Respiratory distress.  On BiPAP.  Diminished breath sounds bilaterally with diffuse wheezing throughout the entire expiratory phase.  Accessory muscle use. Abdominal:     General: Abdomen is flat.     Tenderness: There is no abdominal tenderness. There is no guarding or rebound.  Musculoskeletal:        General: No deformity.     Cervical back: Normal range of motion and neck supple. No rigidity or tenderness.     Right lower leg: No edema.     Left lower leg: No edema.  Skin:    General: Skin is warm and dry.     Capillary Refill: Capillary refill takes less than 2 seconds.  Neurological:      Mental Status: He is alert and oriented to person, place, and time.    ED Results / Procedures / Treatments   Labs (all labs ordered are listed, but only abnormal results are displayed) Labs Reviewed  BASIC METABOLIC PANEL - Abnormal; Notable for the following components:      Result Value   Potassium 5.4 (*)    Glucose, Bld 159 (*)    All other components within normal limits  LACTIC ACID, PLASMA - Abnormal; Notable for the following components:   Lactic Acid, Venous 2.7 (*)    All other components within normal limits  I-STAT VENOUS BLOOD GAS, ED - Abnormal; Notable for the following components:   pCO2, Ven 62.9 (*)    Bicarbonate 29.9 (*)    All other components within normal limits  RESP PANEL BY RT-PCR (FLU A&B, COVID) ARPGX2  CULTURE, BLOOD (ROUTINE X 2)  CULTURE, BLOOD (ROUTINE X 2)  CBC WITH DIFFERENTIAL/PLATELET  LACTIC ACID, PLASMA  CBC WITH DIFFERENTIAL/PLATELET  TROPONIN I (HIGH SENSITIVITY)  TROPONIN I (HIGH SENSITIVITY)    EKG EKG Interpretation  Date/Time:  Monday August 11 2020 08:53:36 EDT Ventricular Rate:  147 PR Interval:  129 QRS Duration: 92 QT Interval:  256 QTC Calculation: 401 R Axis:   87 Text Interpretation: Sinus tachycardia Anterior infarct, old Artifact Abnormal ECG Confirmed by Carmin Muskrat [phone]) on 08/11/2020 9:47:34 AM  Radiology DG Chest Portable 1 View  Result Date: 08/11/2020 CLINICAL DATA:  Shortness of breath, cough. EXAM: PORTABLE CHEST 1 VIEW COMPARISON:  June 26, 2020. FINDINGS: The heart size and mediastinal contours are within normal limits. Minimal bibasilar subsegmental atelectasis or scarring is noted. Old left rib fractures are noted. IMPRESSION: Minimal bibasilar subsegmental atelectasis or scarring. Electronically Signed   By: Marijo Conception M.D.   On: 08/11/2020 09:37    Procedures Procedures   Medications Ordered in ED Medications  albuterol (PROVENTIL,VENTOLIN) solution continuous neb (  10 mg/hr Nebulization New  Bag/Given 08/11/20 0902)  albuterol (VENTOLIN) (5 MG/ML) 0.5% continuous inhalation solution (0 mg/hr  Hold 08/11/20 0902)  azithromycin (ZITHROMAX) 500 mg in sodium chloride 0.9 % 250 mL IVPB (500 mg Intravenous New Bag/Given 08/11/20 1004)  lactated ringers bolus 1,000 mL (1,000 mLs Intravenous New Bag/Given 08/11/20 0930)  acetaminophen (TYLENOL) suppository 650 mg (650 mg Rectal Given 08/11/20 0916)  magnesium sulfate IVPB 2 g 50 mL (0 g Intravenous Stopped 08/11/20 1003)  cefTRIAXone (ROCEPHIN) 1 g in sodium chloride 0.9 % 100 mL IVPB (0 g Intravenous Stopped 08/11/20 1029)    ED Course  I have reviewed the triage vital signs and the nursing notes.  Pertinent labs & imaging results that were available during my care of the patient were reviewed by me and considered in my medical decision making (see chart for details).    MDM Rules/Calculators/A&P                           60 year old male with above past medical history presenting to the emergency department in respiratory distress with EMS.  Vital signs reviewed on arrival, the patient is technically afebrile, although feels warm to the touch.  He is hypertensive.  Tachycardic.  Tachypneic.  Saturating 100% on BiPAP but with a markedly increased work of breathing.  Patient's physical exam is most notable for diffuse wheezing throughout the entire respiratory phase.  He already received 125 mg of Solu-Medrol.  Presentation most concerning for COPD exacerbation given his lung sounds, therefore will administer continuous albuterol and a magnesium bolus in addition to the steroids already delivered.  We will continue to monitor.  Patient is also reports a fever, will obtain a CBC, BMP, cultures, lactic.  Will administer fluid resuscitation.  Patient does not seem clinically volume overloaded at this time, less likely heart failure exacerbation.  I believe that a PE would not sufficiently explained his lung findings on exam, doubt this diagnosis.  pH is  7.28, PCO2 of 62, bicarb of 30.  Largely chronic hypercarbia with smaller acute component.  Lactic acid of 2.7.  Following the magnesium bolus and roughly an hour of continuous albuterol, patient's work of breathing much improved on BiPAP.  His respiratory rate improved to around 25, heart rate improved to around 115.  He seemed much more comfortable.  We will trial the patient off of BiPAP at this time 11:22 AM  After 2 hours off of BiPAP, the patient remains comfortable on 4 L nasal cannula, saturating 100%.  Respiratory rate around 22.  Heart rate has improved to 108.  On repeat lung examination, the patient has a slightly prolonged expiratory phase but no significant wheezing compared to when he arrived.  Overall, the patient seems to have a community-acquired pneumonia leading to a COPD exacerbation and likely sepsis given his leukocytosis, lactic acidosis.  Given fluid resuscitation, antibiotics, stabilized in the ED.  I believe he is appropriate for a floor admission, hospitalist consulted for admission.  Final Clinical Impression(s) / ED Diagnoses Final diagnoses:  Acute respiratory failure with hypoxia [facility])    Rx / DC Orders ED Discharge Orders     None        Claud Kelp, MD 08/11/20 1327    Carmin Muskrat, MD 08/11/20 [phone]

## 2020-08-11 NOTE — Progress Notes (Signed)
Patient requested to take a bipap off so he could eat. [place] stated he was breathing easier. Placed patient on 3lpm cannula and will continue to monitor patient. Nurse is aware of cannula.

## 2020-08-11 NOTE — Progress Notes (Signed)
Patient remains on oxygen set at 2lpm with sp02=99%.RR=19-21 patient stated he has no shortness of breath at this time. Bipap at bedside, will continue to monitor patient.

## 2020-08-11 NOTE — Progress Notes (Signed)
Patient placed on bipap upon arrival. RT asked to start cont neb via bipap. HR in 140's. PA okay to continue with treatment. RN at bedside. RT will monitor.

## 2020-08-11 NOTE — ED Triage Notes (Signed)
Pt arrives via EMS from home, currently on cpap. Pt reports waking up with severe difficulty breathing and chest pain.

## 2020-08-11 NOTE — Progress Notes (Signed)
Patient transported to CT and back to room 7 without complications. RN at bedside.

## 2020-08-11 NOTE — ED Notes (Signed)
Lonn Georgia, RT and Dr. Florene Glen at bedside

## 2020-08-11 NOTE — ED Notes (Signed)
Dr Vanita Panda notified of Lactic  2.7

## 2020-08-12 ENCOUNTER — Ambulatory Visit: Payer: Medicaid Other | Admitting: Critical Care Medicine

## 2020-08-12 DIAGNOSIS — G9341 Metabolic encephalopathy: Secondary | ICD-10-CM

## 2020-08-12 DIAGNOSIS — E875 Hyperkalemia: Secondary | ICD-10-CM

## 2020-08-12 DIAGNOSIS — J9601 Acute respiratory failure with hypoxia: Secondary | ICD-10-CM

## 2020-08-12 LAB — CBC
HCT: 38.8 % — ABNORMAL LOW (ref 39.0–52.0)
Hemoglobin: 12.3 g/dL — ABNORMAL LOW (ref 13.0–17.0)
MCH: 29.6 pg (ref 26.0–34.0)
MCHC: 31.7 g/dL (ref 30.0–36.0)
MCV: 93.5 fL (ref 80.0–100.0)
Platelets: 291 10*3/uL (ref 150–400)
RBC: 4.15 MIL/uL — ABNORMAL LOW (ref 4.22–5.81)
RDW: 13.8 % (ref 11.5–15.5)
WBC: 24.2 10*3/uL — ABNORMAL HIGH (ref 4.0–10.5)
nRBC: 0 % (ref 0.0–0.2)

## 2020-08-12 LAB — BASIC METABOLIC PANEL
Anion gap: 10 (ref 5–15)
BUN: 18 mg/dL (ref 6–20)
CO2: 26 mmol/L (ref 22–32)
Calcium: 9.4 mg/dL (ref 8.9–10.3)
Chloride: 102 mmol/L (ref 98–111)
Creatinine, Ser: 0.71 mg/dL (ref 0.61–1.24)
GFR, Estimated: >60 mL/min (ref >60)
Glucose, Bld: 133 mg/dL — ABNORMAL HIGH (ref 70–99)
Potassium: 4.4 mmol/L (ref 3.5–5.1)
Sodium: 138 mmol/L (ref 135–145)

## 2020-08-12 LAB — CBG MONITORING, ED: Glucose-Capillary: 150 mg/dL — ABNORMAL HIGH (ref 70–99)

## 2020-08-12 LAB — GLUCOSE, CAPILLARY
Glucose-Capillary: 124 mg/dL — ABNORMAL HIGH (ref 70–99)
Glucose-Capillary: 107 mg/dL — ABNORMAL HIGH (ref 70–99)

## 2020-08-12 LAB — LACTIC ACID, PLASMA: Lactic Acid, Venous: 1.5 mmol/L (ref 0.5–1.9)

## 2020-08-12 LAB — PROCALCITONIN: Procalcitonin: 5.52 ng/mL

## 2020-08-12 LAB — MRSA NEXT GEN BY PCR, NASAL: MRSA by PCR Next Gen: NOT DETECTED

## 2020-08-12 MED ORDER — ALBUTEROL SULFATE (2.5 MG/3ML) 0.083% IN NEBU
2.5000 mg | INHALATION_SOLUTION | Freq: Four times a day (QID) | RESPIRATORY_TRACT | Status: DC | PRN
  Administered 2020-08-12: 2.5 mg via RESPIRATORY_TRACT
  Filled 2020-08-12: qty 3

## 2020-08-12 NOTE — Progress Notes (Signed)
PROGRESS NOTE    James [name]  DEY:[id number] DOB: [birth year] DOA: 08/11/2020 PCP: [name], MD    Chief Complaint  Patient presents with   Respiratory Distress    Brief Narrative:  James [name] is a 60 y.o. male with a past medical history of COPD with chronic hypoxic respiratory failure on 2 L oxygen at baseline, hypertension, former tobacco use, former alcohol dependence, diabetes mellitus.   The patient presented to the emergency department with sudden onset of shortness of breath that awakened him in the middle the night.  Had significant wheezing and dry cough.  When EMS arrived he was saturating 80%.  Unclear if this was on his baseline oxygen or on room air.  In the emergency department he was noted to be in respiratory distress with tachypnea and tach, tachycardia and increased work of breathing.  Was initially placed on BiPAP.  Given continuous breathing treatments and IV steroids as well as antibiotics, fluids and magnesium sulfate.  Was transitioned off BiPAP to 4 L nasal cannula.  Subjective:  He is off bipap, reports less sob, less wheezing , continue to have productive cough, he denies chest pain, there is no edema, no fever currently on 3liter oxygen, reports at 2liter at baseline   Assessment & Plan:   Active Problems:   COPD with acute exacerbation (HCC)   Acute hypoxemic respiratory failure (HCC)  COPD exacerbation/acute on chronic hypoxic hypercapnic respiratory failure -Initial VBG showed pH 7.28, pCO2 62.9, p02 45, repeat 2 ABG with improvement of pH to 7.36 , PCO2 54, p02 150 -Blood culture no growth, MRSA screening negative, respiratory viral panel negative, COVID screening negative -Awaiting for sputum culture collection -Procalcitonin in process -Urine strep pneumo and Legionella antigen ordered on admission, pending collection -required bipap initially, improving, continue current regimen, wean oxygen  Acute metabolic encephalopathy -A  present with confusion, CT head no acute findings -Confusion appear has resolved  Hyperkalemia Noted on ACE inhibitor at home Currently ACE inhibitor on hold He received 1 dose of Lokelma Potassium normalized  Hypertension On Lopressor and ACE inhibitor at home Resume Lopressor, continue hold ACE inhibitor   .    Unresulted Labs (From admission, onward)     Start     Ordered   08/13/20 0500  Procalcitonin  Daily,   R      08/12/20 1029   08/12/20 1031  Lactic acid, plasma  ONCE - STAT,   STAT        08/12/20 1030   08/12/20 1030  Procalcitonin - Baseline  ONCE - STAT,   STAT        08/12/20 1029   08/11/20 1450  Expectorated Sputum Assessment w Gram Stain, Rflx to Resp Cult  (COPD / Pneumonia / Cellulitis / Lower Extremity Wound)  Once,   R        08/11/20 1454   08/11/20 1450  Strep pneumoniae urinary antigen  (COPD / Pneumonia / Cellulitis / Lower Extremity Wound)  Once,   STAT        08/11/20 1454   08/11/20 1450  Legionella Pneumophila Serogp 1 Ur Ag  (COPD / Pneumonia / Cellulitis / Lower Extremity Wound)  Once,   STAT        08/11/20 1454   08/11/20 1405  Blood gas, venous  Once,   R       Comments: Get prior to placing back on BIPAP    08/11/20 1405   08/11/20 0852  CBC  with Differential  ONCE - STAT,   STAT        08/11/20 0855   08/11/20 0852  Lactic acid, plasma  Now then every 2 hours,   STAT      08/11/20 0855              DVT prophylaxis: SCDs Start: 08/11/20 1450   Code Status: Full Family Communication: Patient Disposition:   Status is: Inpatient   Dispo: The patient is from: Home              Anticipated d/c is to: Home              Anticipated d/c date is: 24 to 48 hours, pending respiratory status and PT eval                Consultants:  None  Procedures:  None  Antimicrobials:   Anti-infectives (From admission, onward)    Start     Dose/Rate Route Frequency Ordered Stop   08/12/20 1000  cefTRIAXone (ROCEPHIN) 1 g in sodium  chloride 0.9 % 100 mL IVPB        1 g 200 mL/hr over 30 Minutes Intravenous Every 24 hours 08/11/20 1454 08/17/20 0959   08/11/20 1000  cefTRIAXone (ROCEPHIN) 1 g in sodium chloride 0.9 % 100 mL IVPB        1 g 200 mL/hr over 30 Minutes Intravenous  Once 08/11/20 0947 08/11/20 1029   08/11/20 1000  azithromycin (ZITHROMAX) 500 mg in sodium chloride 0.9 % 250 mL IVPB        500 mg 250 mL/hr over 60 Minutes Intravenous Every 24 hours 08/11/20 0947             Objective: Vitals:   08/12/20 1130 08/12/20 1200 08/12/20 1300 08/12/20 1339  BP: 123/65 129/65 (!) 162/102   Pulse: 80 72 100   Resp: 19 16 19    Temp:      TempSrc:      SpO2: 100% 100% 100% 100%    Intake/Output Summary (Last 24 hours) at 08/12/2020 1539 Last data filed at 08/12/2020 1049 Gross per 24 hour  Intake 1100 ml  Output 600 ml  Net 500 ml   There were no vitals filed for this visit.  Examination:  General exam: calm, NAD Respiratory system: overall very diminished, with mild diffuse wheezing. Respiratory effort normal. Cardiovascular system: S1 & S2 heard, RRR. No JVD, no murmur, No pedal edema. Gastrointestinal system: protuberant Abdomen,  nontender.  Normal bowel sounds heard. Central nervous system: Alert and oriented. No focal neurological deficits. Extremities: Symmetric 5 x 5 power. Skin: No rashes, lesions or ulcers Psychiatry: Judgement and insight appear normal. Mood & affect appropriate.     Data Reviewed: I have personally reviewed following labs and imaging studies  CBC: Recent Labs  Lab 08/11/20 0920 08/11/20 1135 08/11/20 1457 08/12/20 0500  WBC  --  22.3*  --  24.2*  NEUTROABS  --  20.6*  --   --   HGB 15.0 12.1* 13.3 12.3*  HCT 44.0 38.3* 39.0 38.8*  MCV  --  93.2  --  93.5  PLT  --  293  --  315    Basic Metabolic Panel: Recent Labs  Lab 08/11/20 0900 08/11/20 0920 08/11/20 1457 08/12/20 0500  NA 141 141 139 138  K 5.4* 4.0 5.3* 4.4  CL 104  --   --  102  CO2 29   --   --  26  GLUCOSE 159*  --   --  133*  BUN 13  --   --  18  CREATININE 0.92  --   --  0.71  CALCIUM 9.4  --   --  9.4    GFR: CrCl cannot be calculated (Unknown ideal weight.).  Liver Function Tests: No results for input(s): AST, ALT, ALKPHOS, BILITOT, PROT, ALBUMIN in the last 168 hours.  CBG: Recent Labs  Lab 08/11/20 2156 08/12/20 0724  GLUCAP 180* 150*     Recent Results (from the past 240 hour(s))  Blood culture (routine x 2)     Status: None (Preliminary result)   Collection Time: 08/11/20  9:00 AM   Specimen: BLOOD  Result Value Ref Range Status   Specimen Description BLOOD RIGHT ANTECUBITAL  Final   Special Requests   Final    BOTTLES DRAWN AEROBIC AND ANAEROBIC Blood Culture results may not be optimal due to an inadequate volume of blood received in culture bottles   Culture   Final    NO GROWTH < 24 HOURS Performed at [facility], [place] [address], [place], [place] [address]    Report Status PENDING  Incomplete  Resp Panel by RT-PCR (Flu A&B, Covid) Nasopharyngeal Swab     Status: None   Collection Time: 08/11/20  9:00 AM   Specimen: Nasopharyngeal Swab; Nasopharyngeal(NP) swabs in vial transport medium  Result Value Ref Range Status   SARS Coronavirus 2 by RT PCR NEGATIVE NEGATIVE Final    Comment: (NOTE) SARS-CoV-2 target nucleic acids are NOT DETECTED.  The SARS-CoV-2 RNA is generally detectable in upper respiratory specimens during the acute phase of infection. The lowest concentration of SARS-CoV-2 viral copies this assay can detect is 138 copies/mL. A negative result does not preclude SARS-Cov-2 infection and should not be used as the sole basis for treatment or other patient management decisions. A negative result may occur with  improper specimen collection/handling, submission of specimen other than nasopharyngeal swab, presence of viral mutation(s) within the areas targeted by this assay, and inadequate number of viral copies(<138  copies/mL). A negative result must be combined with clinical observations, patient history, and epidemiological information. The expected result is Negative.  Fact Sheet for Patients:  EntrepreneurPulse.com.au  Fact Sheet for Healthcare Providers:  IncredibleEmployment.be  This test is no t yet approved or cleared by the [place] FDA and  has been authorized for detection and/or diagnosis of SARS-CoV-2 by FDA under an Emergency Use Authorization (EUA). This EUA will remain  in effect (meaning this test can be used) for the duration of the COVID-19 declaration under Section 564(b)(1) of the Act, 21 U.S.C.section 360bbb-3(b)(1), unless the authorization is terminated  or revoked sooner.       Influenza A by PCR NEGATIVE NEGATIVE Final   Influenza B by PCR NEGATIVE NEGATIVE Final    Comment: (NOTE) The Xpert Xpress SARS-CoV-2/FLU/RSV plus assay is intended as an aid in the diagnosis of influenza from Nasopharyngeal swab specimens and should not be used as a sole basis for treatment. Nasal washings and aspirates are unacceptable for Xpert Xpress SARS-CoV-2/FLU/RSV testing.  Fact Sheet for Patients: EntrepreneurPulse.com.au  Fact Sheet for Healthcare Providers: IncredibleEmployment.be  This test is not yet approved or cleared by the [place] FDA and has been authorized for detection and/or diagnosis of SARS-CoV-2 by FDA under an Emergency Use Authorization (EUA). This EUA will remain in effect (meaning this test can be used) for the duration of the COVID-19 declaration under Section 564(b)(1)  of the Act, 21 U.S.C. section 360bbb-3(b)(1), unless the authorization is terminated or revoked.  Performed at [facility], [place] [address], [place], [place] [address]   Blood culture (routine x 2)     Status: None (Preliminary result)   Collection Time: 08/11/20  9:05 AM   Specimen: BLOOD RIGHT HAND   Result Value Ref Range Status   Specimen Description BLOOD RIGHT HAND  Final   Special Requests   Final    BOTTLES DRAWN AEROBIC AND ANAEROBIC Blood Culture results may not be optimal due to an inadequate volume of blood received in culture bottles   Culture   Final    NO GROWTH < 24 HOURS Performed at [facility], [place] [address], [place], [place] [address]    Report Status PENDING  Incomplete  Respiratory (~20 pathogens) panel by PCR     Status: None   Collection Time: 08/11/20 10:00 PM   Specimen: Nasopharyngeal Swab; Respiratory  Result Value Ref Range Status   Adenovirus NOT DETECTED NOT DETECTED Final   Coronavirus 229E NOT DETECTED NOT DETECTED Final    Comment: (NOTE) The Coronavirus on the Respiratory Panel, DOES NOT test for the novel  Coronavirus (2019 nCoV)    Coronavirus HKU1 NOT DETECTED NOT DETECTED Final   Coronavirus NL63 NOT DETECTED NOT DETECTED Final   Coronavirus OC43 NOT DETECTED NOT DETECTED Final   Metapneumovirus NOT DETECTED NOT DETECTED Final   Rhinovirus / Enterovirus NOT DETECTED NOT DETECTED Final   Influenza A NOT DETECTED NOT DETECTED Final   Influenza B NOT DETECTED NOT DETECTED Final   Parainfluenza Virus 1 NOT DETECTED NOT DETECTED Final   Parainfluenza Virus 2 NOT DETECTED NOT DETECTED Final   Parainfluenza Virus 3 NOT DETECTED NOT DETECTED Final   Parainfluenza Virus 4 NOT DETECTED NOT DETECTED Final   Respiratory Syncytial Virus NOT DETECTED NOT DETECTED Final   Bordetella pertussis NOT DETECTED NOT DETECTED Final   Bordetella Parapertussis NOT DETECTED NOT DETECTED Final   Chlamydophila pneumoniae NOT DETECTED NOT DETECTED Final   Mycoplasma pneumoniae NOT DETECTED NOT DETECTED Final    Comment: Performed at [facility], [place]. [address] [place], [place] [address]  MRSA Next Gen by PCR, Nasal     Status: None   Collection Time: 08/11/20 10:00 PM  Result Value Ref Range Status   MRSA by PCR Next Gen NOT DETECTED NOT DETECTED  Final    Comment: (NOTE) The GeneXpert MRSA Assay (FDA approved for NASAL specimens only), is one component of a comprehensive MRSA colonization surveillance program. It is not intended to diagnose MRSA infection nor to guide or monitor treatment for MRSA infections. Test performance is not FDA approved in patients less than 48 years old. Performed at [facility], [place] [address] [place], [place] [address]          Radiology Studies: CT HEAD WO CONTRAST (5MM)  Result Date: 08/11/2020 CLINICAL DATA:  Delirium EXAM: CT HEAD WITHOUT CONTRAST TECHNIQUE: Contiguous axial images were obtained from the base of the skull through the vertex without intravenous contrast. COMPARISON:  06/16/2013 FINDINGS: Brain: No evidence of acute infarction, hemorrhage, hydrocephalus, extra-axial collection or mass lesion/mass effect. Vascular: Atherosclerotic calcifications involving the large vessels of the skull base. No unexpected hyperdense vessel. Skull: Normal. Negative for fracture or focal lesion. Sinuses/Orbits: Left greater than right mastoid effusions. Paranasal sinuses are clear. Orbital structures unremarkable. Other: None. IMPRESSION: 1. No acute intracranial findings. 2. Left greater than right  mastoid effusions. Electronically Signed   By: Davina Poke D.O.   On: 08/11/2020 17:29   DG Chest Portable 1 View  Result Date: 08/11/2020 CLINICAL DATA:  Shortness of breath, cough. EXAM: PORTABLE CHEST 1 VIEW COMPARISON:  June 26, 2020. FINDINGS: The heart size and mediastinal contours are within normal limits. Minimal bibasilar subsegmental atelectasis or scarring is noted. Old left rib fractures are noted. IMPRESSION: Minimal bibasilar subsegmental atelectasis or scarring. Electronically Signed   By: Marijo Conception M.D.   On: 08/11/2020 09:37        Scheduled Meds:  guaiFENesin  600 mg Oral BID   insulin aspart  0-20 Units Subcutaneous TID WC   insulin aspart  0-5 Units Subcutaneous QHS    ipratropium  0.5 mg Nebulization Q6H   predniSONE  40 mg Oral Q breakfast   sodium zirconium cyclosilicate  10 g Oral Once   umeclidinium-vilanterol  1 puff Inhalation Daily   Continuous Infusions:  albuterol Stopped (08/11/20 1641)   azithromycin Stopped (08/12/20 1334)   cefTRIAXone (ROCEPHIN)  IV Stopped (08/12/20 1049)     LOS: 1 day   Time spent: 7mins Greater than 50% of this time was spent in counseling, explanation of diagnosis, planning of further management, and coordination of care.   Voice Recognition Viviann Spare dictation system was used to create this note, attempts have been made to correct errors. Please contact the author with questions and/or clarifications.   Florencia Reasons, MD PhD FACP [facility]  Available via Epic secure chat 7am-7pm for nonurgent issues Please page for urgent issues To page the attending provider between 7A-7P or the covering provider during after hours 7P-7A, please log into the web site [email or web address] and access using universal [place] password for that web site. If you do not have the password, please call the hospital operator.    08/12/2020, 3:39 PM

## 2020-08-12 NOTE — Progress Notes (Signed)
Pt VS are stable no bipap needed at this time.

## 2020-08-12 NOTE — ED Notes (Signed)
Breakfast Orders Placed °

## 2020-08-13 ENCOUNTER — Other Ambulatory Visit: Payer: Self-pay

## 2020-08-13 LAB — BASIC METABOLIC PANEL
Anion gap: 5 (ref 5–15)
BUN: 23 mg/dL — ABNORMAL HIGH (ref 6–20)
CO2: 30 mmol/L (ref 22–32)
Calcium: 9.6 mg/dL (ref 8.9–10.3)
Chloride: 105 mmol/L (ref 98–111)
Creatinine, Ser: 0.76 mg/dL (ref 0.61–1.24)
GFR, Estimated: >60 mL/min (ref >60)
Glucose, Bld: 98 mg/dL (ref 70–99)
Potassium: 4.2 mmol/L (ref 3.5–5.1)
Sodium: 140 mmol/L (ref 135–145)

## 2020-08-13 LAB — CBC WITH DIFFERENTIAL/PLATELET
Abs Immature Granulocytes: 0.06 10*3/uL (ref 0.00–0.07)
Basophils Absolute: 0 10*3/uL (ref 0.0–0.1)
Basophils Relative: 0 %
Eosinophils Absolute: 0.1 10*3/uL (ref 0.0–0.5)
Eosinophils Relative: 0 %
HCT: 37.9 % — ABNORMAL LOW (ref 39.0–52.0)
Hemoglobin: 12.2 g/dL — ABNORMAL LOW (ref 13.0–17.0)
Immature Granulocytes: 0 %
Lymphocytes Relative: 12 %
Lymphs Abs: 2 10*3/uL (ref 0.7–4.0)
MCH: 29.7 pg (ref 26.0–34.0)
MCHC: 32.2 g/dL (ref 30.0–36.0)
MCV: 92.2 fL (ref 80.0–100.0)
Monocytes Absolute: 1.4 10*3/uL — ABNORMAL HIGH (ref 0.1–1.0)
Monocytes Relative: 8 %
Neutro Abs: 12.9 10*3/uL — ABNORMAL HIGH (ref 1.7–7.7)
Neutrophils Relative %: 80 %
Platelets: 291 10*3/uL (ref 150–400)
RBC: 4.11 MIL/uL — ABNORMAL LOW (ref 4.22–5.81)
RDW: 13.8 % (ref 11.5–15.5)
WBC: 16.5 10*3/uL — ABNORMAL HIGH (ref 4.0–10.5)
nRBC: 0 % (ref 0.0–0.2)

## 2020-08-13 LAB — PROCALCITONIN: Procalcitonin: 4.67 ng/mL

## 2020-08-13 LAB — GLUCOSE, CAPILLARY
Glucose-Capillary: 97 mg/dL (ref 70–99)
Glucose-Capillary: 132 mg/dL — ABNORMAL HIGH (ref 70–99)

## 2020-08-13 LAB — MAGNESIUM: Magnesium: 2 mg/dL (ref 1.7–2.4)

## 2020-08-13 MED ORDER — PREDNISONE 10 MG PO TABS: 10.0000 mg | ORAL_TABLET | Freq: Every day | ORAL | 3 refills | Status: DC

## 2020-08-13 MED ORDER — PREDNISONE 10 MG PO TABS
10.0000 mg | ORAL_TABLET | Freq: Every day | ORAL | 0 refills | Status: DC
  Filled 2020-08-13 – 2020-08-25 (×2): qty 30, 30d supply, fill #0

## 2020-08-13 MED ORDER — PREDNISONE 20 MG PO TABS
ORAL_TABLET | ORAL | 0 refills | Status: AC
  Filled 2020-08-13: qty 9, 6d supply, fill #0

## 2020-08-13 MED ORDER — AZITHROMYCIN 500 MG PO TABS
500.0000 mg | ORAL_TABLET | Freq: Every day | ORAL | 0 refills | Status: AC
  Filled 2020-08-13: qty 3, 3d supply, fill #0

## 2020-08-13 MED ORDER — AMOXICILLIN 500 MG PO CAPS
1000.0000 mg | ORAL_CAPSULE | Freq: Three times a day (TID) | ORAL | 0 refills | Status: AC
  Filled 2020-08-13: qty 18, 3d supply, fill #0

## 2020-08-13 MED ORDER — PREDNISONE 10 MG PO TABS: 10.0000 mg | ORAL_TABLET | Freq: Every day | ORAL | 0 refills | Status: DC

## 2020-08-13 NOTE — Discharge Summary (Signed)
Physician Discharge Summary  James [name] BJS:[id number] DOB: 04/10/1960 DOA: 08/11/2020  PCP: [name], MD  Admit date: 08/11/2020 Discharge date: 08/13/2020  Time spent: 40 minutes  Recommendations for Outpatient Follow-up:  Follow outpatient CBC/CMP (attention to leukocytosis with elevated procal here at discharge) Follow BP (losartan d/c'd during admission due to hyperkalemia, BP reasonable at discharge) Follow steroid taper, ensure he has home steroid dose (refill sent on discharge) Follow final cultures (Bcx NGTDx2 days at discharge), discharged on 3 days abx Follow o2 requirement outpatient, discharged on home 2 L   Discharge Diagnoses:  Active Problems:   COPD with acute exacerbation ([place])   Acute hypoxemic respiratory failure (James [name])   Discharge Condition: stable  Diet recommendation: heart healthy  There were no vitals filed for this visit.  History of present illness:  James [name] is James [name] 60 y.o. male with Richey Doolittle past medical history of COPD with chronic hypoxic respiratory failure on 2 L oxygen at baseline, hypertension, former tobacco use, former alcohol dependence, diabetes mellitus.  He was admitted for James [name] COPD exacerbation, requiring bipap at presentation.  He's improved with steroids, nebs, and antibiotics.    Stable for discharge on 8/10.  See below and previous notes for additional details.    Hospital Course:  Acute on Chronic Hypoxic and Hypercapneic Respiratory Failure  COPD Exacerbation - required bipap at presentation - blood gas improved - blood cx NG x2, RVP negative - negative covid - negative MRSA - urine strep, urine legionella pending collection at discharge, sputum cx pending collection - Procalcitonin was notably elevated, but is downtrending with antibiotics - afebrile x24 hrs - leukocytosis is downtrending  - CXR 8/8 with minimal bibasilar subsegmental atelectasis or scarring - he's close to his baseline today, plan to discharge with  antibiotics and steroid taper   Elevated Procalcitonin - he's on abx for COPD exacerbation, procal honestly more elevated than I would expect for COPD exacerbation without clear pneumonia - leukocytosis is improving, he's afebrile x24 hrs, blood cultures are no growth - recommend following clinically outpatient   Acute metabolic encephalopathy -James [name] present with confusion, CT head no acute findings -Confusion appear has resolved   Hyperkalemia Noted on ARB at home Currently ARB inhibitor on hold He received 1 dose of Lokelma Potassium normalized   Hypertension On Lopressor and ARB at home Resume Lopressor, continue hold ARB inhibitor at discharge with reasonable BP   Procedures: none  Consultations: none  Discharge Exam: Vitals:   08/13/20 0841 08/13/20 1151  BP:  130/79  Pulse:  97  Resp:  20  Temp:  97.8 F (36.6 C)  SpO2: 94% 91%   Feels about back to his baseline No new complaints  General: No acute distress. Cardiovascular: Heart sounds show James [name] regular rate, and rhythm. Lungs: Clear to auscultation bilaterally  Abdomen: Soft, nontender, nondistended  Neurological: Alert and oriented 3. Moves all extremities 4 . Cranial nerves II through XII grossly intact. Skin: Warm and dry. No rashes or lesions. Extremities: No clubbing or cyanosis. No edema.  Discharge Instructions   Discharge Instructions     Call MD for:  difficulty breathing, headache or visual disturbances   Complete by: As directed    Call MD for:  extreme fatigue   Complete by: As directed    Call MD for:  hives   Complete by: As directed    Call MD for:  persistant dizziness or light-headedness   Complete by: As directed    Call MD for:  persistant nausea and vomiting   Complete by: As directed    Call MD for:  redness, tenderness, or signs of infection (pain, swelling, redness, odor or green/yellow discharge around incision site)   Complete by: As directed    Call MD for:  severe  uncontrolled pain   Complete by: As directed    Call MD for:  temperature >100.4   Complete by: As directed    Diet - low sodium heart healthy   Complete by: As directed    Discharge instructions   Complete by: As directed    You were seen with James [name] COPD exacerbation.  You've improved back to baseline.   Please taper your steroids to your home dose as prescribed.  Continue your home tiotropium and symbicort.  Use your albuterol as needed.  We're discharging you with antibiotics as well.  Follow up with [name] for follow up labs to make sure your infection is continuing to improve.   Continue your home oxygen as prescribed (2 liters).  We've stopped your losartan.  Resume your lasix as needed.  Continue your metoprolol.  Follow up your blood pressure with your PCP to further adjust your BP meds.  Return for new, recurrent, or worsening symptoms.  Please ask your PCP to request records from this hospitalization so they know what was done and what the next steps will be.   Increase activity slowly   Complete by: As directed       Allergies as of 08/13/2020       Reactions   Gabapentin Other (See Comments)   hallucinations        Medication List     STOP taking these medications    losartan 100 MG tablet Commonly known as: COZAAR       TAKE these medications    albuterol 108 (90 Base) MCG/ACT inhaler Commonly known as: VENTOLIN HFA Inhale 2 puffs into the lungs every 4 (four) hours as needed for wheezing or shortness of breath. What changed: Another medication with the same name was changed. Make sure you understand how and when to take each.   albuterol (2.5 MG/3ML) 0.083% nebulizer solution Commonly known as: PROVENTIL TAKE 3 MLS (2.5 MG TOTAL) BY NEBULIZATION IN THE MORNING, AT NOON, AND AT BEDTIME. What changed:  how much to take how to take this when to take this   amoxicillin 500 MG capsule Commonly known as: AMOXIL Take 2 capsules (1,000 mg total)  by mouth 3 (three) times daily for 3 days.   azithromycin 500 MG tablet Commonly known as: Zithromax Take 1 tablet (500 mg total) by mouth daily for 3 days.   Flutter Devi Use 4 times daily   furosemide 20 MG tablet Commonly known as: LASIX Take 1 tablet (20 mg total) by mouth daily as needed for edema.   guaiFENesin 600 MG 12 hr tablet Commonly known as: MUCINEX Take 1 tablet (600 mg total) by mouth 2 (two) times daily as needed for cough or to loosen phlegm.   metFORMIN 1000 MG tablet Commonly known as: GLUCOPHAGE Take 1 tablet (1,000 mg total) by mouth 2 (two) times daily with James [name] meal.   metoprolol succinate 25 MG 24 hr tablet Commonly known as: TOPROL-XL Take 1 tablet (25 mg total) by mouth daily.   pantoprazole 40 MG tablet Commonly known as: PROTONIX Take 1 tablet (40 mg total) by mouth daily.   predniSONE 20 MG tablet Commonly known as: DELTASONE Take 2 tablets (40 mg total) by  mouth daily with breakfast for 2 days, THEN 1.5 tablets (30 mg total) daily with breakfast for 2 days, THEN 1 tablet (20 mg total) daily with breakfast for 2 days. Then resume home dose of 10 mg daily. Start taking on: August 14, 2020 What changed: See the new instructions.   predniSONE 10 MG tablet Commonly known as: DELTASONE Take 1 tablet (10 mg total) by mouth daily with breakfast. Start taking on: August 20, 2020 What changed: These instructions start on August 20, 2020. If you are unsure what to do until then, ask your doctor or other care provider.   Spiriva Respimat 2.5 MCG/ACT Aers Generic drug: Tiotropium Bromide Monohydrate INHALE 2 PUFFS INTO THE LUNGS DAILY.   Symbicort 160-4.5 MCG/ACT inhaler Generic drug: budesonide-formoterol INHALE 2 PUFFS INTO THE LUNGS 2 (TWO) TIMES DAILY.       Allergies  Allergen Reactions   Gabapentin Other (See Comments)    hallucinations    Follow-up Information     [name], MD.   Specialty: Pulmonary Disease Contact  information: [address] [place] [address] [phone]                  The results of significant diagnostics from this hospitalization (including imaging, microbiology, ancillary and laboratory) are listed below for reference.    Significant Diagnostic Studies: CT HEAD WO CONTRAST (5MM)  Result Date: 08/11/2020 CLINICAL DATA:  [place] EXAM: CT HEAD WITHOUT CONTRAST TECHNIQUE: Contiguous axial images were obtained from the base of the skull through the vertex without intravenous contrast. COMPARISON:  06/16/2013 FINDINGS: Brain: No evidence of acute infarction, hemorrhage, hydrocephalus, extra-axial collection or mass lesion/mass effect. Vascular: Atherosclerotic calcifications involving the large vessels of the skull base. No unexpected hyperdense vessel. Skull: Normal. Negative for fracture or focal lesion. Sinuses/Orbits: Left greater than right mastoid effusions. Paranasal sinuses are clear. Orbital structures unremarkable. Other: None. IMPRESSION: 1. No acute intracranial findings. 2. Left greater than right mastoid effusions. Electronically Signed   By: Davina Poke D.O.   On: 08/11/2020 17:29   DG Chest Portable 1 View  Result Date: 08/11/2020 CLINICAL DATA:  Shortness of breath, cough. EXAM: PORTABLE CHEST 1 VIEW COMPARISON:  June 26, 2020. FINDINGS: The heart size and mediastinal contours are within normal limits. Minimal bibasilar subsegmental atelectasis or scarring is noted. Old left rib fractures are noted. IMPRESSION: Minimal bibasilar subsegmental atelectasis or scarring. Electronically Signed   By: Marijo Conception M.D.   On: 08/11/2020 09:37    Microbiology: Recent Results (from the past 240 hour(s))  Blood culture (routine x 2)     Status: None (Preliminary result)   Collection Time: 08/11/20  9:00 AM   Specimen: BLOOD  Result Value Ref Range Status   Specimen Description BLOOD RIGHT ANTECUBITAL  Final   Special Requests   Final    BOTTLES DRAWN  AEROBIC AND ANAEROBIC Blood Culture results may not be optimal due to an inadequate volume of blood received in culture bottles   Culture   Final    NO GROWTH 2 DAYS Performed at [facility], [place] [address] [place], [place] [address]    Report Status PENDING  Incomplete  Resp Panel by RT-PCR (Flu Raziyah Vanvleck&B, Covid) Nasopharyngeal Swab     Status: None   Collection Time: 08/11/20  9:00 AM   Specimen: Nasopharyngeal Swab; Nasopharyngeal(NP) swabs in vial transport medium  Result Value Ref Range Status   SARS Coronavirus 2 by RT PCR NEGATIVE NEGATIVE Final  Comment: (NOTE) SARS-CoV-2 target nucleic acids are NOT DETECTED.  The SARS-CoV-2 RNA is generally detectable in upper respiratory specimens during the acute phase of infection. The lowest concentration of SARS-CoV-2 viral copies this assay can detect is 138 copies/mL. Melody Cirrincione negative result does not preclude SARS-Cov-2 infection and should not be used as the sole basis for treatment or other patient management decisions. Kadey Mihalic negative result may occur with  improper specimen collection/handling, submission of specimen other than nasopharyngeal swab, presence of viral mutation(s) within the areas targeted by this assay, and inadequate number of viral copies(<138 copies/mL). Jahkai Yandell negative result must be combined with clinical observations, patient history, and epidemiological information. The expected result is Negative.  Fact Sheet for Patients:  EntrepreneurPulse.com.au  Fact Sheet for Healthcare Providers:  IncredibleEmployment.be  This test is no t yet approved or cleared by the [place] FDA and  has been authorized for detection and/or diagnosis of SARS-CoV-2 by FDA under an Emergency Use Authorization (EUA). This EUA will remain  in effect (meaning this test can be used) for the duration of the COVID-19 declaration under Section 564(b)(1) of the Act, 21 U.S.C.section 360bbb-3(b)(1), unless  the authorization is terminated  or revoked sooner.       Influenza Duanne Duchesne by PCR NEGATIVE NEGATIVE Final   Influenza B by PCR NEGATIVE NEGATIVE Final    Comment: (NOTE) The Xpert Xpress SARS-CoV-2/FLU/RSV plus assay is intended as an aid in the diagnosis of influenza from Nasopharyngeal swab specimens and should not be used as [place] sole basis for treatment. Nasal washings and aspirates are unacceptable for Xpert Xpress SARS-CoV-2/FLU/RSV testing.  Fact Sheet for Patients: EntrepreneurPulse.com.au  Fact Sheet for Healthcare Providers: IncredibleEmployment.be  This test is not yet approved or cleared by the [place] FDA and has been authorized for detection and/or diagnosis of SARS-CoV-2 by FDA under an Emergency Use Authorization (EUA). This EUA will remain in effect (meaning this test can be used) for the duration of the COVID-19 declaration under Section 564(b)(1) of the Act, 21 U.S.C. section 360bbb-3(b)(1), unless the authorization is terminated or revoked.  Performed at [facility], [place] [address] [place], [place] [address]   Blood culture (routine x 2)     Status: None (Preliminary result)   Collection Time: 08/11/20  9:05 AM   Specimen: BLOOD RIGHT HAND  Result Value Ref Range Status   Specimen Description BLOOD RIGHT HAND  Final   Special Requests   Final    BOTTLES DRAWN AEROBIC AND ANAEROBIC Blood Culture results may not be optimal due to an inadequate volume of blood received in culture bottles   Culture   Final    NO GROWTH 2 DAYS Performed at [facility], [place] [address] [place], [place] [address]    Report Status PENDING  Incomplete  Respiratory (~20 pathogens) panel by PCR     Status: None   Collection Time: 08/11/20 10:00 PM   Specimen: Nasopharyngeal Swab; Respiratory  Result Value Ref Range Status   Adenovirus NOT DETECTED NOT DETECTED Final   Coronavirus 229E NOT DETECTED NOT DETECTED Final    Comment:  (NOTE) The Coronavirus on the Respiratory Panel, DOES NOT test for the novel  Coronavirus (2019 nCoV)    Coronavirus HKU1 NOT DETECTED NOT DETECTED Final   Coronavirus NL63 NOT DETECTED NOT DETECTED Final   Coronavirus OC43 NOT DETECTED NOT DETECTED Final   Metapneumovirus NOT DETECTED NOT DETECTED Final   Rhinovirus / Enterovirus NOT DETECTED NOT DETECTED Final  Influenza Mckenzey Parcell NOT DETECTED NOT DETECTED Final   Influenza B NOT DETECTED NOT DETECTED Final   Parainfluenza Virus 1 NOT DETECTED NOT DETECTED Final   Parainfluenza Virus 2 NOT DETECTED NOT DETECTED Final   Parainfluenza Virus 3 NOT DETECTED NOT DETECTED Final   Parainfluenza Virus 4 NOT DETECTED NOT DETECTED Final   Respiratory Syncytial Virus NOT DETECTED NOT DETECTED Final   Bordetella pertussis NOT DETECTED NOT DETECTED Final   Bordetella Parapertussis NOT DETECTED NOT DETECTED Final   Chlamydophila pneumoniae NOT DETECTED NOT DETECTED Final   Mycoplasma pneumoniae NOT DETECTED NOT DETECTED Final    Comment: Performed at [facility], [place] [address] [place], [place] [address]  MRSA Next Gen by PCR, Nasal     Status: None   Collection Time: 08/11/20 10:00 PM  Result Value Ref Range Status   MRSA by PCR Next Gen NOT DETECTED NOT DETECTED Final    Comment: (NOTE) The GeneXpert MRSA Assay (FDA approved for NASAL specimens only), is one component of Aviya Jarvie comprehensive MRSA colonization surveillance program. It is not intended to diagnose MRSA infection nor to guide or monitor treatment for MRSA infections. Test performance is not FDA approved in patients less than 18 years old. Performed at [facility], [place] [address] [place], [place] [address]      Labs: Basic Metabolic Panel: Recent Labs  Lab 08/11/20 0900 08/11/20 0920 08/11/20 1457 08/12/20 0500 08/13/20 0356  NA 141 141 139 138 140  K 5.4* 4.0 5.3* 4.4 4.2  CL 104  --   --  102 105  CO2 29  --   --  26 30  GLUCOSE 159*  --   --  133* 98  BUN 13   --   --  18 23*  CREATININE 0.92  --   --  0.71 0.76  CALCIUM 9.4  --   --  9.4 9.6  MG  --   --   --   --  2.0   Liver Function Tests: No results for input(s): AST, ALT, ALKPHOS, BILITOT, PROT, ALBUMIN in the last 168 hours. No results for input(s): LIPASE, AMYLASE in the last 168 hours. No results for input(s): AMMONIA in the last 168 hours. CBC: Recent Labs  Lab 08/11/20 0920 08/11/20 1135 08/11/20 1457 08/12/20 0500 08/13/20 0356  WBC  --  22.3*  --  24.2* 16.5*  NEUTROABS  --  20.6*  --   --  12.9*  HGB 15.0 12.1* 13.3 12.3* 12.2*  HCT 44.0 38.3* 39.0 38.8* 37.9*  MCV  --  93.2  --  93.5 92.2  PLT  --  293  --  291 291   Cardiac Enzymes: No results for input(s): CKTOTAL, CKMB, CKMBINDEX, TROPONINI in the last 168 hours. BNP: BNP (last 3 results) Recent Labs    10/19/19 0235 03/26/20 0441 06/26/20 0432  BNP 123.3* 101.5* 28.2    ProBNP (last 3 results) No results for input(s): PROBNP in the last 8760 hours.  CBG: Recent Labs  Lab 08/11/20 2156 08/12/20 0724 08/12/20 1616 08/12/20 2251 08/13/20 0753  GLUCAP 180* 150* 124* 107* 97       Signed:  Fayrene Helper MD.  [facility] 08/13/2020, 5:42 PM

## 2020-08-13 NOTE — Evaluation (Signed)
Physical Therapy Evaluation Patient Details Name: James [name] MRN: [id number] DOB: Nov 12, 1960 Today's Date: 08/13/2020   History of Present Illness  Pt is 60 yo admitted with COPD exacerbation on 08/11/20.  He has hx of HTN, DM, on 2 L home O2.  Clinical Impression  Pt admitted with above diagnosis.  He demonstrates safe gait with stable O2 on 2 L.  Pt is at his baseline and has no further PT needs.   SATURATION QUALIFICATIONS: (This note is used to comply with regulatory documentation for home oxygen)  Patient Saturations on Room Air at Rest = 91%  Patient Saturations on Room Air while Ambulating = 86%  Patient Saturations on 2 Liters of oxygen while Ambulating = 92%  Please briefly explain why patient needs home oxygen: In order to maintain oxygen saturation with activity.     Follow Up Recommendations No PT follow up    Equipment Recommendations  None recommended by PT    Recommendations for Other Services       Precautions / Restrictions Precautions Precautions: None Restrictions Weight Bearing Restrictions: No      Mobility  Bed Mobility Overal bed mobility: Independent                  Transfers Overall transfer level: Independent Equipment used: None             General transfer comment: Had supervision during therapy but performed safely  Ambulation/Gait Ambulation/Gait assistance: Supervision Gait Distance (Feet): 300 Feet Assistive device: None Gait Pattern/deviations: Step-through pattern;WFL(Within Functional Limits)     General Gait Details: Normal gait pattern and velocity.  Steady without LOB  Stairs            Wheelchair Mobility    Modified Rankin (Stroke Patients Only)       Balance Overall balance assessment: Independent   Sitting balance-Leahy Scale: Normal       Standing balance-Leahy Scale: Good                               Pertinent Vitals/Pain Pain Assessment: No/denies pain    Home  Living Family/patient expects to be discharged to:: Private residence Living Arrangements: Alone Available Help at Discharge: Family;Available PRN/intermittently Type of Home: House Home Access: Stairs to enter Entrance Stairs-Rails: None Entrance Stairs-Number of Steps: 2 Home Layout: One level Home Equipment: None Additional Comments: On 2 L O2 at home    Prior Function Level of Independence: Independent         Comments: drives, manages his household, enjoys Haematologist; confirms that sister still assists with medications;     Hand Dominance   Dominant Hand: Right    Extremity/Trunk Assessment   Upper Extremity Assessment Upper Extremity Assessment: Overall WFL for tasks assessed    Lower Extremity Assessment Lower Extremity Assessment: Overall WFL for tasks assessed    Cervical / Trunk Assessment Cervical / Trunk Assessment: Normal  Communication   Communication: No difficulties  Cognition Arousal/Alertness: Awake/alert Behavior During Therapy: WFL for tasks assessed/performed Overall Cognitive Status: Within Functional Limits for tasks assessed                                        General Comments      Exercises     Assessment/Plan    PT Assessment Patent does not need any further PT  services  PT Problem List         PT Treatment Interventions      PT Goals (Current goals can be found in the Care Plan section)  Acute Rehab PT Goals Patient Stated Goal: go home today PT Goal Formulation: All assessment and education complete, DC therapy    Frequency     Barriers to discharge        Co-evaluation               AM-PAC PT "6 Clicks" Mobility  Outcome Measure Help needed turning from your back to your side while in a flat bed without using bedrails?: None Help needed moving from lying on your back to sitting on the side of a flat bed without using bedrails?: None Help needed moving to and from a bed to a chair (including  a wheelchair)?: None Help needed standing up from a chair using your arms (e.g., wheelchair or bedside chair)?: None Help needed to walk in hospital room?: None Help needed climbing 3-5 steps with a railing? : None 6 Click Score: 24    End of Session Equipment Utilized During Treatment: Oxygen Activity Tolerance: Patient tolerated treatment well Patient left: with chair alarm set;in chair;with call bell/phone within reach Nurse Communication: Mobility status (chair alarm does not have connector to call bell, pt would be safe ambulating alone except for lines)      Time: 9201-0071 PT Time Calculation (min) (ACUTE ONLY): 21 min   Charges:   PT Evaluation $PT Eval Low Complexity: 1 Low          Ayahna Solazzo, PT Acute Rehab Services Pager [phone] [facility] [phone]   Karlton Lemon 08/13/2020, 11:55 AM

## 2020-08-13 NOTE — Consult Note (Signed)
   [facility] Inpatient Consult   08/13/2020  James [name] May 30, 1960 [id number]  Managed Medicaid: Healthy Blue prepaid  Patient screened for hospitalization with noted high risk score for unplanned readmission risk and  to assess for potential Managed Medicaid Care Management restart of services.  Review of patient's medical record reveals patient has been active with the Managed Medicaid RN Care Coordinator.  Plan:  Continue to follow progress and disposition to assess for post hospital care management needs and alert MM RN CC of any additional needs for transition back to community.    For questions contact:   Natividad Brood, RN BSN [facility] Liaison  [phone] business mobile phone Toll free office [phone]  Fax number: [phone] [email or web address]

## 2020-08-14 ENCOUNTER — Ambulatory Visit: Payer: Medicaid Other | Admitting: Critical Care Medicine

## 2020-08-14 ENCOUNTER — Other Ambulatory Visit: Payer: Self-pay

## 2020-08-14 LAB — PROCALCITONIN: Procalcitonin: 1.7 ng/mL

## 2020-08-14 LAB — GLUCOSE, CAPILLARY
Glucose-Capillary: 92 mg/dL (ref 70–99)
Glucose-Capillary: 152 mg/dL — ABNORMAL HIGH (ref 70–99)

## 2020-08-14 NOTE — Progress Notes (Signed)
Pt stated that he didn't have a ride home after his last run I of IV meds. Called Ed for a taxi voucher and Ed stated that they didn't;t have any at the time. So pt remind tonight and will have his sister pick him up in the morning.

## 2020-08-14 NOTE — Progress Notes (Signed)
Discharge placed yesterday, but no ride He's discharged today before I was able to see him.   Vitals reviewed and stable.  Called him, he notes he's feeling progressively better. no concerns.   No charge note.

## 2020-08-14 NOTE — Patient Outreach (Signed)
Care Coordination  08/14/2020  MONTAGUE CORELLA [birth year] [id number]  James [name] is currently admitted as an inpatient at [facility] team will follow the progress of JANZEN SACKS and follow up upon discharge.   [name] RN, BSN [place]  [employer] manager

## 2020-08-14 NOTE — TOC Initial Note (Addendum)
Transition of Care [facility]) - Initial/Assessment Note    Patient Details  Name: James [name] MRN: [id number] Date of Birth: 16-Oct-1960  Transition of Care [facility]) CM/SW Contact:    Angelita Ingles, RN Phone Number:[phone]  08/14/2020, 10:06 AM  Clinical Narrative:                 Patient from home where he is currently on O2. Functions independently and has a PCP and follows up on a regular basis. Patient states that he has access to medications and transportation to [place]. No concerns other that patient has appointment today but will call to reschedule due to being in the hospital. [facility] will continue to follow for d/c needs.   Expected Discharge Plan: Home/Self Care Barriers to Discharge: Transportation   Patient Goals and CMS Choice Patient states their goals for this hospitalization and ongoing recovery are:: Patient states that he is ready to go home      Expected Discharge Plan and Services Expected Discharge Plan: Home/Self Care In-house Referral: NA Discharge Planning Services: CM Consult   Living arrangements for the past 2 months: Single Family Home Expected Discharge Date: 08/13/20               DME Arranged: N/A DME Agency: NA       HH Arranged: NA HH Agency: NA        Prior Living Arrangements/Services Living arrangements for the past 2 months: Single Family Home Lives with:: Self Patient language and need for interpreter reviewed:: Yes Do you feel safe going back to the place where you live?: Yes      Need for Family Participation in Patient Care: Yes (Comment) Care giver support system in place?: Yes (comment) Current home services: DME ([employer] for home O2) Criminal Activity/Legal Involvement Pertinent to Current Situation/Hospitalization: No - Comment as needed  Activities of Daily Living Home Assistive Devices/Equipment: None ADL Screening (condition at time of admission) Patient's cognitive ability adequate to safely complete daily activities?:  Yes Is the patient deaf or have difficulty hearing?: No Does the patient have difficulty seeing, even when wearing glasses/contacts?: No Does the patient have difficulty concentrating, remembering, or making decisions?: No Patient able to express need for assistance with ADLs?: Yes Does the patient have difficulty dressing or bathing?: No Independently performs ADLs?: Yes (appropriate for developmental age) Does the patient have difficulty walking or climbing stairs?: No Weakness of Legs: None Weakness of Arms/Hands: None  Permission Sought/Granted   Permission granted to share information with : No              Emotional Assessment Appearance:: Appears older than stated age Attitude/Demeanor/Rapport: Gracious Affect (typically observed): Pleasant Orientation: : Oriented to Self, Oriented to Place, Oriented to  Time, Oriented to Situation Alcohol / Substance Use: Not Applicable Psych Involvement: No (comment)  Admission diagnosis:  Acute respiratory failure with hypoxia ([place]) [J96.01] COPD with acute exacerbation ([place]) [J44.1] Acute hypoxemic respiratory failure ([place]) [J96.01] Patient Active Problem List   Diagnosis Date Noted   Acute hypoxemic respiratory failure ([place]) 08/12/2020   COPD with acute exacerbation ([place]) 08/11/2020   CAP (community acquired pneumonia) 06/26/2020   Palliative care encounter 05/26/2020   Alcohol withdrawal syndrome, with delirium ([place])    Obesity (BMI 30.0-34.9) 04/19/2020   Pain of right forearm 12/03/2019   Bronchiectasis ([place]) 09/24/2019   Type 2 diabetes mellitus without complication, without long-term current use of insulin ([place]) 07/24/2019   Other atopic dermatitis 05/01/2019   Chronic respiratory  failure with hypoxia ([place]) 04/23/2019   Intellectual disability 04/23/2019   History of alcohol use 11/23/2018   Hypertension 05/17/2016   History of tobacco abuse 04/06/2016   COPD mixed type ([place]) 03/26/2016   PCP:  [name],  MD Pharmacy:   [facility] [place] [address] Phone: [phone] Fax: [place] [address] [place] [address] Phone: [phone] Fax: [phone]  [facility] - [place], [place] - [address] [place] [address] Phone: [phone] Fax: [phone]     Social Determinants of Health (SDOH) Interventions    Readmission Risk Interventions Readmission Risk Prevention Plan 08/14/2020 04/24/2020 03/28/2020  Transportation Screening Complete Complete Complete  PCP or Specialist Appt within 3-5 Days Complete - Not Complete  Not Complete comments patient already has an appointment schedules with [name] - Pt had existing appt with [name] on 04/14/20.  [place] or Home Care Consult Complete - Complete  Social Work Consult for Recovery Care Planning/Counseling Complete - Complete  Palliative Care Screening Not Applicable - Not Applicable  Medication Review (RN Care Manager) Complete - -  PCP or Specialist appointment within 3-5 days of discharge - Not Complete -  PCP/Specialist Appt Not Complete comments - Pt had existing appt 5/23--this is first available per [place] or [place] - Complete -  SW Recovery Care/Counseling Consult - Complete -  [place] - Complete -  [place] - Not Applicable -  Some recent data might be hidden

## 2020-08-14 NOTE — TOC Transition Note (Signed)
Transition of Care [facility]) - CM/SW Discharge Note   Patient Details  Name: James [name] MRN: [id number] Date of Birth: 1960-06-03  Transition of Care [facility]) CM/SW Contact:  Angelita Ingles, RN Phone Number:[phone]  08/14/2020, 1:19 PM   Clinical Narrative:    Portable O2 tank has been delivered for d/c. Ride has been set up with [facility] transport. Bedside nurse has been made aware. Transport will call unit when they arrive to transport the patient. No other needs noted at this time. TOC will sign off.    Final next level of care: Home/Self Care Barriers to Discharge: No Barriers Identified   Patient Goals and CMS Choice Patient states their goals for this hospitalization and ongoing recovery are:: Patient states that he is ready to go home      Discharge Placement                       Discharge Plan and Services In-house Referral: NA Discharge Planning Services: CM Consult            DME Arranged: Oxygen (portable tank delivered for home O2) DME Agency: NA, [employer] (previously established with [employer])       HH Arranged: NA HH Agency: NA        Social Determinants of Health (SDOH) Interventions     Readmission Risk Interventions Readmission Risk Prevention Plan 08/14/2020 04/24/2020 03/28/2020  Transportation Screening Complete Complete Complete  PCP or Specialist Appt within 3-5 Days Complete - Not Complete  Not Complete comments patient already has an appointment schedules with [name] - Pt had existing appt with [name] on 04/14/20.  [place] or Home Care Consult Complete - Complete  Social Work Consult for Recovery Care Planning/Counseling Complete - Complete  Palliative Care Screening Not Applicable - Not Applicable  Medication Review (RN Care Manager) Complete - -  PCP or Specialist appointment within 3-5 days of discharge - Not Complete -  PCP/Specialist Appt Not Complete comments - Pt had existing appt 5/23--this is first available per [place] or  [place] - Complete -  SW Recovery Care/Counseling Consult - Complete -  [place] - Complete -  [place] - Not Applicable -  Some recent data might be hidden

## 2020-08-14 NOTE — TOC Progression Note (Signed)
Transition of Care [facility]) - Progression Note    Patient Details  Name: James [name] MRN: [id number] Date of Birth: [birth year]  Transition of Care [facility]) CM/SW [place], RN Phone Number:[phone]  08/14/2020, 10:17 AM  Clinical Narrative:    CM at bedside to determine supplier of patients O2. Patient is unable to provide info to CM . CM spoke with [employer] to determine if the agency provides patients O2. [employer] does provide patients O2. [employer] to deliver portable tank to room. CM will set up transport once portable tank is delivered.   Expected Discharge Plan: Home/Self Care Barriers to Discharge: Transportation  Expected Discharge Plan and Services Expected Discharge Plan: Home/Self Care In-house Referral: NA Discharge Planning Services: CM Consult   Living arrangements for the past 2 months: Single Family Home Expected Discharge Date: 08/13/20               DME Arranged: N/A DME Agency: NA       HH Arranged: NA HH Agency: NA         Social Determinants of Health (SDOH) Interventions    Readmission Risk Interventions Readmission Risk Prevention Plan 08/14/2020 04/24/2020 03/28/2020  Transportation Screening Complete Complete Complete  PCP or Specialist Appt within 3-5 Days Complete - Not Complete  Not Complete comments patient already has an appointment schedules with [name] - Pt had existing appt with [name] on 04/14/20.  [place] or Home Care Consult Complete - Complete  Social Work Consult for Recovery Care Planning/Counseling Complete - Complete  Palliative Care Screening Not Applicable - Not Applicable  Medication Review (RN Care Manager) Complete - -  PCP or Specialist appointment within 3-5 days of discharge - Not Complete -  PCP/Specialist Appt Not Complete comments - Pt had existing appt 5/23--this is first available per [place] or [place] - Complete -  SW Recovery Care/Counseling Consult - Complete -  [place] -  Complete -  [place] - Not Applicable -  Some recent data might be hidden

## 2020-08-14 NOTE — Progress Notes (Signed)
Pt. Ready for D/C night shift nurse and day nurse call pt. Sister was supposed to pick him up, never answer the phone or returned the calls. Pt. Is currently waiting for transport and loaner oxygen to go home. Ivs were D/C before shift. Pt. Remain stable and ready for D/C.

## 2020-08-15 ENCOUNTER — Other Ambulatory Visit: Payer: Self-pay | Admitting: *Deleted

## 2020-08-15 ENCOUNTER — Other Ambulatory Visit: Payer: Self-pay

## 2020-08-15 ENCOUNTER — Telehealth: Payer: Self-pay

## 2020-08-15 NOTE — Telephone Encounter (Signed)
Transition Care Management Follow-up Telephone Call Date of discharge and from where: 08/14/2020 - [facility] How have you been since you were released from the hospital? "Okay I guess" Any questions or concerns? No   Items Reviewed: Did the pt receive and understand the discharge instructions provided? Yes  Medications obtained and verified? Pt was at the pharmacy waiting for his meds . Stated he understands all his meds  Other? No  Any new allergies since your discharge? No  Dietary orders reviewed? No Do you have support at home? Yes , sister      Functional Questionnaire: (I = Independent and D = Dependent) ADLs: I   Follow up appointments reviewed:   PCP Hospital f/u appt confirmed? Yes made sooner appt on 09/03/2020 with Dr [facility] f/u appt confirmed? Yes Are transportation arrangements needed? No  If their condition worsens, is the pt aware to call PCP or go to the Emergency Dept.? Yes pt aware of worsening signs and symptoms and the need to refer to ED if any occur.  Was the patient provided with contact information for the PCP's office or ED? Yes Was to pt encouraged to call back with questions or concerns? Yes

## 2020-08-15 NOTE — Patient Instructions (Signed)
Visit Information  James [name] was given information about Medicaid Managed Care team care coordination services as a part of their Healthy [facility] Medicaid benefit. James [name] verbally consented to engagement with the [facility] Managed Care team.   If you are experiencing a medical emergency, please call 911 or report to your local emergency department or urgent care.   If you have a non-emergency medical problem during routine business hours, please contact your provider's office and ask to speak with a nurse.   For questions related to your Healthy [facility] health plan, please call: [phone] or visit the homepage here: [email or web address]  If you would like to schedule transportation through your Healthy [facility] plan, please call the following number at least 2 days in advance of your appointment: [phone]  Call the [name] at [phone], at any time, 24 hours a day, 7 days a week. If you are in danger or need immediate medical attention call 911.  If you would like help to quit smoking, call 1-800-QUIT-NOW (660) 786-0280) OR Espaol: 1-855-Djelo-Ya (1-937-902-4097) o para ms informacin haga clic aqu or Text READY to 200-400 to register via text  Mr. Heckmann - following are the goals we discussed in your visit today:   Goals Addressed             This Visit's Progress    Track and Manage My Triggers-COPD       Timeframe:  Long-Range Goal Priority:  High Start Date:  04/09/20                           Expected End Date:   08/19/20                    Follow Up Date 08/19/20  - pick up new prescriptions and take as directed  - avoid outdoors during the warmest part of the day - avoid second hand smoke - identify and remove indoor air pollutants - limit outdoor activity during cold weather - listen for public air quality announcements every day  - take prescribed medications as directed - call PCP  with new concerns or questions - take all medications as directed   Why is this important?   Triggers are activities or things, like tobacco smoke or cold weather, that make your COPD (chronic obstructive pulmonary disease) flare-up.  Knowing these triggers helps you plan how to stay away from them.  When you cannot remove them, you can learn how to manage them.             Please see education materials related to COPD provided by MyChart link.  Patient verbalizes understanding of instructions provided today.   The Managed Medicaid care management team will reach out to the patient again over the next 7 days.   [name] RN, BSN [place] RN Care Coordinator   Following is a copy of your plan of care:  Patient Care Plan: COPD (Adult)     Problem Identified: Symptom Exacerbation (COPD)      Long-Range Goal: Symptom Exacerbation Prevented or Minimized   Start Date: 04/09/2020  Expected End Date: 10/14/2020  Recent Progress: On track  Priority: High  Note:   Current Barriers:  Knowledge deficits related to basic COPD self care/management-James [name] was recently admitted for COPD exacerbation and pnuemonia. He reports taking his medications, wearing oxygen continuously, and has a follow up with his PCP on  04/14/20. He does report having difficulty at times swallowing food and liquid, pain in back and legs, and difficulty falling asleep. He is planning to discuss these issues with his PCP. James [name] reports having transportation and plenty of food. He lives alone, and has support from his sister. James [name] attended his follow up with PCP. He was working outside at his home today and reports doing "about the same". He reports increased difficulty breathing when it is hot outside. He has his medications and oxygen, reports no needs at this time. He is working with [facility] pharmacist for medication management.Update-James [name] reports his breathing is about  the same, worse with the warm temperatures. He needs two medications refilled and request RNCM to call him on Monday at 10am to assist in requesting refills. He is aware of his PCP appointment on 7/28 and has transportation.  He has not been drinking alcohol and reports feeling better since quitting. -Update- James [name] recently hospitalized for COPD exacerbation, discharged home 8/11. Patient needs discharge medications and reports that he will pick them up today. He using 2L of oxygen continuous and resting. He reports improvement with breathing after using new inhaler Anoro.  Case Manager Clinical Goal(s): patient will report using inhalers as prescribed including rinsing mouth after use patient will engage in lite exercise as tolerated to build/regain stamina and strength and reduce shortness of breath through activity tolerance patient will verbalize basic understanding of COPD disease process and self care activities patient will not be hospitalized for COPD exacerbation as evidenced  Patient will work with [facility] pharmacist for medication management-Met Patient will attend all scheduled appointments Interventions:  Inter-disciplinary care team collaboration (see longitudinal plan of care)  Encouraged to avoid being outside during the middle of the day when it is hot outside Advised patient to have someone pick up prescriptions and start taking as directed. Stressed the importance of taking prescribed medications Provided RNCM contact information for any healthcare related barriers RNCM will follow closely RNCM collaborated with PCP to schedule missed appointment on 8/11. New appointment made for 09/29/20 @ 10:30am. RNCM requested an earlier appointment, scheduler with collaborate with [name] re: next appointment Patient Goals/Self-Care Activities:  - avoid outdoors during the warmest part of the day - avoid second hand smoke - identify and remove indoor air pollutants - limit outdoor  activity during cold weather - listen for public air quality announcements every day  - take prescribed medications as directed - call PCP with new concerns or questions - develop a new routine to improve sleep - get at least 7 to 8 hours of sleep at night - keep room cool and dark - practice relaxation or meditation daily  - call PCP with new concerns or questions - take all medications as directed Follow Up Plan: Telephone follow up appointment with care management team member scheduled for:08/19/20     Patient Care Plan: Medication Management     Problem Identified: Health Promotion or Disease Self-Management (General Plan of Care)      Goal: Medication Management   Note:   Current Barriers:  Unable to independently monitor therapeutic efficacy Unable to self administer medications as prescribed Does not adhere to prescribed medication regimen Does not maintain contact with provider office Does not contact provider office for questions/concerns   Pharmacist Clinical Goal(s):  Over the next 30 days, patient will contact provider office for questions/concerns as evidenced notation of same in electronic health record through collaboration with PharmD and provider.  Interventions: Inter-disciplinary care team collaboration (see longitudinal plan of care) Comprehensive medication review performed; medication list updated in electronic medical record  _0 @ _1 @ _2 @ Health Maintenance  Patient Goals/Self-Care Activities Over the next 30 days, patient will:  - collaborate with provider on medication access solutions  Follow Up Plan: The care management team will reach out to the patient again over the next 30 days.       Patient Care Plan: Wellness (Adult)     Problem Identified: (RNCM) Alcohol Use (Wellness)      Long-Range Goal: Alcohol Use Managed   Start Date: 04/25/2020  Expected End Date: 08/25/2020  Recent Progress:  On track  Priority: High  Note:   Current Barriers:  Ineffective Self Health Maintenance-James [name] was admitted on 4/16-4/21. Patient reports it was because of the alcohol. He wants to quit and feels that he can stop drinking on his own.  Update-James [name] reports no alcohol and no desire to drink. He needs two medications refilled and request RNCM to assist him Monday at 10am. He is not at home currently and is unsure of which two medications need refilled. Does not adhere to provider recommendations re: quit drinking alcohol Currently UNABLE TO independently self manage needs related to chronic health conditions.  Knowledge Deficits related to short term plan for care coordination needs and long term plans for chronic disease management needs Nurse Case Manager Clinical Goal(s):  patient will work with care management team to address care coordination and chronic disease management needs related to alcohol use   Interventions:  Evaluation of current treatment plan related to alcohol use and quitting and patient's adherence to plan as established by provider. Advised patient to avoid friends/family/situations that drinking is involved, consider counseling for quitting drinking, reach out to supportive friends/family Discussed plans with patient for ongoing care management follow up and provided patient with direct contact information for care management team Provided therapeutic listening Self Care Activities:  Patient will self administer medications as prescribed Patient will attend all scheduled provider appointments Patient will call pharmacy for medication refills Patient will call provider office for new concerns or questions Patient Goals: - avoid people and places where alcohol is used - develop a plan to avoid relapse  - think about counseling to assist in [place] - work with Ubaldo [place] for therapy options Follow Up Plan: Telephone follow up appointment with care management team  member scheduled for:07/21/20 @ 10am

## 2020-08-15 NOTE — Patient Outreach (Signed)
Medicaid Managed Care   Nurse Care Manager Note  08/15/2020 Name:  James [name] MRN:  [id number] DOB:  08/09/1960  James [name] is an 60 y.o. year old male who is [name] primary patient of James Gaskins Burnett Harry, MD.  The [facility] Managed Care Coordination team was consulted for assistance with:    COPD  James [name] was given information about Medicaid Managed Care Coordination team services today. James [name] Patient agreed to services and verbal consent obtained.  Engaged with patient by telephone for follow up visit in response to provider referral for case management and/or care coordination services.   Assessments/Interventions:  Review of past medical history, allergies, medications, health status, including review of consultants reports, laboratory and other test data, was performed as part of comprehensive evaluation and provision of chronic care management services.  SDOH (Social Determinants of Health) assessments and interventions performed:   Care Plan  Allergies  Allergen Reactions   Gabapentin Other (See Comments)    hallucinations    Medications Reviewed Today     Reviewed by James [name] (Registered Nurse) on 08/15/20 at [place] List Status: <None>   Medication Order Taking? Sig Documenting Provider Last Dose Status Informant  albuterol (PROVENTIL) (2.5 MG/3ML) 0.083% nebulizer solution [id number] Yes TAKE 3 MLS (2.5 MG TOTAL) BY NEBULIZATION IN THE MORNING, AT NOON, AND AT BEDTIME. James Stain, MD Taking Active            Med Note James [name] Aug 11, 2020  6:41 PM) LF 07/10/2020  albuterol (VENTOLIN HFA) 108 (90 Base) MCG/ACT inhaler [id number] Yes Inhale 2 puffs into the lungs every 4 (four) hours as needed for wheezing or shortness of breath. Ward, Delice Bison, James [name] Taking Active Self  amoxicillin (AMOXIL) 500 MG capsule [id number] No Take 2 capsules (1,000 mg total) by mouth 3 (three) times daily for 3 days.  Patient not taking: Reported on  08/15/2020   James [name]., MD Not Taking Active            Med Note James [name], Lise Pincus [name]   Fri Aug 15, 2020 10:24 AM) Needs to pick up prescription today  azithromycin (ZITHROMAX) 500 MG tablet [id number] No Take 1 tablet (500 mg total) by mouth daily for 3 days.  Patient not taking: Reported on 08/15/2020   James [name]., MD Not Taking Active            Med Note James [name], Niyah Mamaril [name]   Fri Aug 15, 2020 10:21 AM) Needs to pick up prescription today  budesonide-formoterol (SYMBICORT) 160-4.5 MCG/ACT inhaler [id number] Yes INHALE 2 PUFFS INTO THE LUNGS 2 (TWO) TIMES DAILY. James Stain, MD Taking Active Self           Med Note James [name] Aug 11, 2020  6:40 PM) LF 06/10/2020  furosemide (LASIX) 20 MG tablet [id number] Yes Take 1 tablet (20 mg total) by mouth daily as needed for edema. James Stain, MD Taking Active Self           Med Note James [name], Tally Joe Aug 11, 2020  6:40 PM) LF 07/14/2020  guaiFENesin (MUCINEX) 600 MG 12 hr tablet [id number] Yes Take 1 tablet (600 mg total) by mouth 2 (two) times daily as needed for cough or to loosen phlegm. James Bi, MD Taking Active   metFORMIN (GLUCOPHAGE) 1000 MG tablet [id number] Yes Take 1 tablet (1,000 mg total) by  mouth 2 (two) times daily with [name] meal. James Stain, MD Taking Active            Med Note James [name] Aug 11, 2020  2:33 PM) 90 day supply 07/30/2020  metoprolol succinate (TOPROL-XL) 25 MG 24 hr tablet [id number] Yes Take 1 tablet (25 mg total) by mouth daily. James Stain, MD Taking Active Self           Med Note James [name] Aug 11, 2020  2:33 PM) 90 day supply 06/10/2020  pantoprazole (PROTONIX) 40 MG tablet [id number] Yes Take 1 tablet (40 mg total) by mouth daily. James Stain, MD Taking Active            Med Note James [name] Aug 11, 2020  6:39 PM) LF 07/23/2020  predniSONE (DELTASONE) 10 MG tablet [id number]  Take 1 tablet (10 mg total) by mouth daily with  breakfast. James [name]., MD  Active   predniSONE (DELTASONE) 20 MG tablet [id number] No Take 2 tablets (40 mg total) by mouth daily with breakfast for 2 days, THEN 1.5 tablets (30 mg total) daily with breakfast for 2 days, THEN 1 tablet (20 mg total) daily with breakfast for 2 days. Then resume home dose of 10 mg daily.  Patient not taking: Reported on 08/15/2020   James [name]., MD Not Taking Active            Med Note James [name], James [name]   Fri Aug 15, 2020 10:19 AM) Needs to pick up prescription today  Respiratory Therapy Supplies (FLUTTER) DEVI [id number]  Use 4 times daily James Stain, MD  Active Self  Tiotropium Bromide Monohydrate 2.5 MCG/ACT AERS [id number] Yes INHALE 2 PUFFS INTO THE LUNGS DAILY. James Stain, MD Taking Active Self           Med Note James [name] Aug 11, 2020  6:39 PM) LF 07/10/2020            Patient Active Problem List   Diagnosis Date Noted   Acute hypoxemic respiratory failure ([place]) 08/12/2020   COPD with acute exacerbation ([place]) 08/11/2020   CAP (community acquired pneumonia) 06/26/2020   Palliative care encounter 05/26/2020   Alcohol withdrawal syndrome, with delirium ([place])    Obesity (BMI 30.0-34.9) 04/19/2020   Pain of right forearm 12/03/2019   Bronchiectasis ([place]) 09/24/2019   Type 2 diabetes mellitus without complication, without long-term current use of insulin ([place]) 07/24/2019   Other atopic dermatitis 05/01/2019   Chronic respiratory failure with hypoxia ([place]) 04/23/2019   Intellectual disability 04/23/2019   History of alcohol use 11/23/2018   Hypertension 05/17/2016   History of tobacco abuse 04/06/2016   COPD mixed type ([place]) 03/26/2016    Conditions to be addressed/monitored per PCP order:  COPD  Care Plan : COPD (Adult)  Updates made by James [name] since 08/15/2020 12:00 AM     Problem: Symptom Exacerbation (COPD)      Long-Range Goal: Symptom Exacerbation Prevented or Minimized   Start  Date: 04/09/2020  Expected End Date: 10/14/2020  Recent Progress: On track  Priority: High  Note:   Current Barriers:  Knowledge deficits related to basic COPD self care/management-James [name] was recently admitted for COPD exacerbation and pnuemonia. He reports taking his medications, wearing oxygen continuously, and has [name] follow up with his PCP on 04/14/20. He does report having difficulty at times  swallowing food and liquid, pain in back and legs, and difficulty falling asleep. He is planning to discuss these issues with his PCP. Mr. Wilk reports having transportation and plenty of food. He lives alone, and has support from his sister. Mr. Kolar attended his follow up with PCP. He was working outside at his home today and reports doing "about the same". He reports increased difficulty breathing when it is hot outside. He has his medications and oxygen, reports no needs at this time. He is working with [facility] pharmacist for medication management.Update-Mr. Pink reports his breathing is about the same, worse with the warm temperatures. He needs two medications refilled and request RNCM to call him on Monday at 10am to assist in requesting refills. He is aware of his PCP appointment on 7/28 and has transportation.  He has not been drinking alcohol and reports feeling better since quitting. -Update- Mr. Bossier recently hospitalized for COPD exacerbation, discharged home 8/11. Patient needs discharge medications and reports that he will pick them up today. He using 2L of oxygen continuous and resting. He reports improvement with breathing after using new inhaler Anoro.  Case Manager Clinical Goal(s): patient will report using inhalers as prescribed including rinsing mouth after use patient will engage in lite exercise as tolerated to build/regain stamina and strength and reduce shortness of breath through activity tolerance patient will verbalize basic understanding of COPD disease process and self care  activities patient will not be hospitalized for COPD exacerbation as evidenced  Patient will work with [facility] pharmacist for medication management-Met Patient will attend all scheduled appointments Interventions:  Inter-disciplinary care team collaboration (see longitudinal plan of care)  Encouraged to avoid being outside during the middle of the day when it is hot outside Advised patient to have someone pick up prescriptions and start taking as directed. Stressed the importance of taking prescribed medications Provided RNCM contact information for any healthcare related barriers RNCM will follow closely RNCM collaborated with PCP to schedule missed appointment on 8/11. New appointment made for 09/29/20 @ 10:30am. RNCM requested an earlier appointment, scheduler with collaborate with [name] re: next appointment Patient Goals/Self-Care Activities:  - avoid outdoors during the warmest part of the day - avoid second hand smoke - identify and remove indoor air pollutants - limit outdoor activity during cold weather - listen for public air quality announcements every day  - take prescribed medications as directed - call PCP with new concerns or questions - develop [name] new routine to improve sleep - get at least 7 to 8 hours of sleep at night - keep room cool and dark - practice relaxation or meditation daily  - call PCP with new concerns or questions - take all medications as directed Follow Up Plan: Telephone follow up appointment with care management team member scheduled for:08/19/20      Follow Up:  Patient agrees to Care Plan and Follow-up.  Plan: The Managed Medicaid care management team will reach out to the patient again over the next 7 days.  Date/time of next scheduled [name] care management/care coordination outreach:  08/19/20 @ 10am  [name] [name], [place] [name] Care Coordinator

## 2020-08-16 LAB — CULTURE, BLOOD (ROUTINE X 2)
Culture: NO GROWTH
Culture: NO GROWTH

## 2020-08-18 ENCOUNTER — Other Ambulatory Visit: Payer: Self-pay

## 2020-08-19 ENCOUNTER — Other Ambulatory Visit: Payer: Self-pay

## 2020-08-19 ENCOUNTER — Other Ambulatory Visit: Payer: Medicaid Other | Admitting: *Deleted

## 2020-08-19 NOTE — Patient Outreach (Signed)
Medicaid Managed Care   Nurse Care Manager Note  08/19/2020 Name:  James [name] MRN:  [id number] DOB:  Apr 22, 1960  James [name] is an 60 y.o. year old male who is a primary patient of [name], MD.  The [facility] Managed Care Coordination team was consulted for assistance with:    COPD  James [name] was given information about Medicaid Managed Care Coordination team services today. James [name] Patient agreed to services and verbal consent obtained.  Engaged with patient by telephone for follow up visit in response to provider referral for case management and/or care coordination services.   Assessments/Interventions:  Review of past medical history, allergies, medications, health status, including review of consultants reports, laboratory and other test data, was performed as part of comprehensive evaluation and provision of chronic care management services.  SDOH (Social Determinants of Health) assessments and interventions performed: SDOH Interventions    Flowsheet Row Most Recent Value  SDOH Interventions   Food Insecurity Interventions Intervention Not Indicated  Transportation Interventions Intervention Not Indicated       Care Plan  Allergies  Allergen Reactions   Gabapentin Other (See Comments)    hallucinations    Medications Reviewed Today     Reviewed by [name], RN (Registered Nurse) on 08/19/20 at [place] List Status: <None>   Medication Order Taking? Sig Documenting Provider Last Dose Status Informant  albuterol (PROVENTIL) (2.5 MG/3ML) 0.083% nebulizer solution [id number] No TAKE 3 MLS (2.5 MG TOTAL) BY NEBULIZATION IN THE MORNING, AT NOON, AND AT BEDTIME. [name], MD Taking Active            Med Note Rosine Beat Aug 11, 2020  6:41 PM) LF 07/10/2020  albuterol (VENTOLIN HFA) 108 (90 Base) MCG/ACT inhaler [id number] No Inhale 2 puffs into the lungs every 4 (four) hours as needed for wheezing or shortness of breath. Ward,  Delice Bison, DO Taking Active Self  budesonide-formoterol [facility]) 160-4.5 MCG/ACT inhaler [id number] No INHALE 2 PUFFS INTO THE LUNGS 2 (TWO) TIMES DAILY. [name], MD Taking Active Self           Med Note Rosine Beat Aug 11, 2020  6:40 PM) LF 06/10/2020  furosemide (LASIX) 20 MG tablet [id number] No Take 1 tablet (20 mg total) by mouth daily as needed for edema. [name], MD Taking Active Self           Med Note Orvan Seen, Tally Joe Aug 11, 2020  6:40 PM) LF 07/14/2020  guaiFENesin (MUCINEX) 600 MG 12 hr tablet [id number] No Take 1 tablet (600 mg total) by mouth 2 (two) times daily as needed for cough or to loosen phlegm. [name], MD Taking Active   metFORMIN (GLUCOPHAGE) 1000 MG tablet [id number] No Take 1 tablet (1,000 mg total) by mouth 2 (two) times daily with a meal. [name], MD Taking Active            Med Note Vita Barley Aug 11, 2020  2:33 PM) 90 day supply 07/30/2020  metoprolol succinate (TOPROL-XL) 25 MG 24 hr tablet [id number] No Take 1 tablet (25 mg total) by mouth daily. [name], MD Taking Active Self           Med Note Vita Barley Aug 11, 2020  2:33 PM) 90 day supply 06/10/2020  pantoprazole (PROTONIX) 40 MG tablet [id number] No  Take 1 tablet (40 mg total) by mouth daily. [name], MD Taking Active            Med Note Rosine Beat Aug 11, 2020  6:39 PM) LF 07/23/2020  predniSONE (DELTASONE) 10 MG tablet [id number]  Take 1 tablet (10 mg total) by mouth daily with breakfast. Elodia Florence., MD  Active   predniSONE (DELTASONE) 20 MG tablet [id number] No Take 2 tablets (40 mg total) by mouth daily with breakfast for 2 days, THEN 1.5 tablets (30 mg total) daily with breakfast for 2 days, THEN 1 tablet (20 mg total) daily with breakfast for 2 days. Then resume home dose of 10 mg daily.  Patient not taking: Reported on 08/15/2020   Elodia Florence., MD Not Taking Active            Med  Note [name], Justina Bertini A   Fri Aug 15, 2020 10:19 AM) Needs to pick up prescription today  Respiratory Therapy Supplies (FLUTTER) DEVI [id number] No Use 4 times daily [name], MD Taking Active Self  Tiotropium Bromide Monohydrate 2.5 MCG/ACT AERS [id number] No INHALE 2 PUFFS INTO THE LUNGS DAILY. [name], MD Taking Active Self           Med Note Rosine Beat Aug 11, 2020  6:39 PM) LF 07/10/2020            Patient Active Problem List   Diagnosis Date Noted   Acute hypoxemic respiratory failure ([place]) 08/12/2020   COPD with acute exacerbation ([place]) 08/11/2020   CAP (community acquired pneumonia) 06/26/2020   Palliative care encounter 05/26/2020   Alcohol withdrawal syndrome, with delirium ([place])    Obesity (BMI 30.0-34.9) 04/19/2020   Pain of right forearm 12/03/2019   Bronchiectasis ([place]) 09/24/2019   Type 2 diabetes mellitus without complication, without long-term current use of insulin ([place]) 07/24/2019   Other atopic dermatitis 05/01/2019   Chronic respiratory failure with hypoxia ([place]) 04/23/2019   Intellectual disability 04/23/2019   History of alcohol use 11/23/2018   Hypertension 05/17/2016   History of tobacco abuse 04/06/2016   COPD mixed type ([place]) 03/26/2016    Conditions to be addressed/monitored per PCP order:  COPD  Care Plan : COPD (Adult)  Updates made by [name], RN since 08/19/2020 12:00 AM     Problem: Symptom Exacerbation (COPD)      Long-Range Goal: Symptom Exacerbation Prevented or Minimized   Start Date: 04/09/2020  Expected End Date: 10/29/2020  Recent Progress: On track  Priority: High  Note:   Current Barriers:  Knowledge deficits related to basic COPD self care/management-James [name] was recently admitted for COPD exacerbation and pnuemonia. He reports taking his medications, wearing oxygen continuously, and has a follow up with his PCP on 04/14/20. He does report having difficulty at times swallowing food and liquid,  pain in back and legs, and difficulty falling asleep. He is planning to discuss these issues with his PCP. James [name] reports having transportation and plenty of food. He lives alone, and has support from his sister. James [name] attended his follow up with PCP. He was working outside at his home today and reports doing "about the same". He reports increased difficulty breathing when it is hot outside. He has his medications and oxygen, reports no needs at this time. He is working with [facility] pharmacist for medication management.Update-James [name] reports his breathing is about the same, worse with the warm  temperatures. He needs two medications refilled and request RNCM to call him on Monday at 10am to assist in requesting refills. He is aware of his PCP appointment on 7/28 and has transportation.  He has not been drinking alcohol and reports feeling better since quitting. -Update- James [name] recently hospitalized for COPD exacerbation, discharged home 8/11. Patient needs discharge medications and reports that he will pick them up today. He using 2L of oxygen continuous and resting. He reports improvement with breathing after using new inhaler Anoro. 08/19/20-RNCM followed up with James [name] to verify that he was able to pick up all medications and he is taking them as directed. Case Manager Clinical Goal(s): patient will report using inhalers as prescribed including rinsing mouth after use patient will engage in lite exercise as tolerated to build/regain stamina and strength and reduce shortness of breath through activity tolerance patient will verbalize basic understanding of COPD disease process and self care activities patient will not be hospitalized for COPD exacerbation as evidenced  Patient will work with [facility] pharmacist for medication management-Met Patient will attend all scheduled appointments Interventions:  Inter-disciplinary care team collaboration (see longitudinal plan of care)  Encouraged to avoid  being outside during the middle of the day when it is hot outside Advised patient to have someone pick up prescriptions and start taking as directed. Stressed the importance of taking prescribed medications Provided RNCM contact information for any healthcare related barriers RNCM will follow closely RNCM collaborated with PCP to schedule missed appointment on 8/11. New appointment made for 09/03/20 Collaborate with James [name] re: stationary concentrator Patient Goals/Self-Care Activities:  - take all prescribed medication as directed - attend next appointment with [name] on 8/31  - avoid outdoors during the warmest part of the day - avoid second hand smoke - identify and remove indoor air pollutants - limit outdoor activity during cold weather - listen for public air quality announcements every day  - call PCP with new concerns or questions - develop a new routine to improve sleep - get at least 7 to 8 hours of sleep at night - keep room cool and dark - practice relaxation or meditation daily Follow Up Plan: Telephone follow up appointment with care management team member scheduled for:08/29/20 @ [place] : [facility] (Adult)  Updates made by [name], RN since 08/19/2020 12:00 AM     Problem: (RNCM) Alcohol Use (Wellness)      Long-Range Goal: Alcohol Use Managed   Start Date: 04/25/2020  Expected End Date: 08/29/2020  Recent Progress: On track  Priority: High  Note:   Current Barriers:  Ineffective Self Health Maintenance-Mr. Hanley was admitted on 4/16-4/21. Patient reports it was because of the alcohol. He wants to quit and feels that he can stop drinking on his own.  Update-Mr. Matsuoka continues to report no alcohol and no desire to drink.  Does not adhere to provider recommendations re: quit drinking alcohol Currently UNABLE TO independently self manage needs related to chronic health conditions.  Knowledge Deficits related to short term plan for care  coordination needs and long term plans for chronic disease management needs Nurse Case Manager Clinical Goal(s):  patient will work with care management team to address care coordination and chronic disease management needs related to alcohol use   Interventions:  Evaluation of current treatment plan related to alcohol use and quitting and patient's adherence to plan as established by provider. Advised patient to avoid friends/family/situations that drinking is involved,  consider counseling for quitting drinking, reach out to supportive friends/family Discussed plans with patient for ongoing care management follow up and provided patient with direct contact information for care management team Provided therapeutic listening Self Care Activities:  Patient will self administer medications as prescribed Patient will attend all scheduled provider appointments Patient will call pharmacy for medication refills Patient will call provider office for new concerns or questions Patient Goals: - avoid people and places where alcohol is used - develop a plan to avoid relapse  - think about counseling to assist in [place] - work with Ubaldo Glassing for therapy options Follow Up Plan: Telephone follow up appointment with care management team member scheduled for:08/29/20 @ 10am      Follow Up:  Patient agrees to Care Plan and Follow-up.  Plan: The Managed Medicaid care management team will reach out to the patient again over the next 15 days.  Date/time of next scheduled RN care management/care coordination outreach:  08/29/20 @ 10am  [name] RN, [place] RN Care Coordinator

## 2020-08-19 NOTE — Patient Instructions (Signed)
Visit Information  Mr. Philson was given information about Medicaid Managed Care team care coordination services as a part of their Healthy [facility] Medicaid benefit. [name] verbally consented to engagement with the [facility] Managed Care team.   If you are experiencing a medical emergency, please call 911 or report to your local emergency department or urgent care.   If you have a non-emergency medical problem during routine business hours, please contact your provider's office and ask to speak with a nurse.   For questions related to your Healthy [facility] health plan, please call: [phone] or visit the homepage here: [email or web address]  If you would like to schedule transportation through your Healthy [facility] plan, please call the following number at least 2 days in advance of your appointment: [phone]  Call the [place] at [phone], at any time, 24 hours a day, 7 days a week. If you are in danger or need immediate medical attention call 911.  If you would like help to quit smoking, call 1-800-QUIT-NOW (636) 606-3599) OR Espaol: 1-855-Djelo-Ya (4-431-540-0867) o para ms informacin haga clic aqu or Text READY to 200-400 to register via text  Mr. Wynter - following are the goals we discussed in your visit today:   Goals Addressed             This Visit's Progress    Manage Fatigue (Tiredness-COPD)       Follow Up Date 08/29/20   - develop a new routine to improve sleep - get at least 7 to 8 hours of sleep at night - keep room cool and dark - practice relaxation or meditation daily    Why is this important?   Feeling tired or worn out is a common symptom of COPD (chronic obstructive pulmonary disease).  Learning when you feel your best and when you need rest is important.  Managing the tiredness (fatigue) will help you be active and enjoy life.          Stop or Cut Down Drinking Alcohol        Timeframe:  Long-Range Goal Priority:  High Start Date:   04/25/20                          Expected End Date:  10/29/20                     Follow Up Date 08/29/20   - avoid people and places where alcohol is used - develop a plan to avoid relapse  - think about counseling to assist in [place] - work with [name]/[name] for therapy options   Why is this important?   To stop drinking it is important to have support from a person or group of people who you can count on.  You will also need to think about the things that make you feel like using alcohol; then plan for how to handle them.         Track and Manage My Triggers-COPD       Timeframe:  Long-Range Goal Priority:  High Start Date:  04/09/20                           Expected End Date:   10/2620                    Follow Up Date 08/29/20  - take all prescribed medication as  directed - attend next appointment with [name] on 8/31  - avoid outdoors during the warmest part of the day - avoid second hand smoke - identify and remove indoor air pollutants - limit outdoor activity during cold weather - listen for public air quality announcements every day  - call PCP with new concerns or questions    Why is this important?   Triggers are activities or things, like tobacco smoke or cold weather, that make your COPD (chronic obstructive pulmonary disease) flare-up.  Knowing these triggers helps you plan how to stay away from them.  When you cannot remove them, you can learn how to manage them.             Please see education materials related to respiratory failure provided by MyChart link.  Information provided by Mychart, patient has access to the portal  Telephone follow up appointment with Managed Medicaid care management team member scheduled for:08/29/20 @ [place] RN, BSN [place] RN Care Coordinator   Following is a copy of your plan of care:  Patient Care Plan:  COPD (Adult)     Problem Identified: Symptom Exacerbation (COPD)      Long-Range Goal: Symptom Exacerbation Prevented or Minimized   Start Date: 04/09/2020  Expected End Date: 10/29/2020  Recent Progress: On track  Priority: High  Note:   Current Barriers:  Knowledge deficits related to basic COPD self care/management-Mr. Bialas was recently admitted for COPD exacerbation and pnuemonia. He reports taking his medications, wearing oxygen continuously, and has a follow up with his PCP on 04/14/20. He does report having difficulty at times swallowing food and liquid, pain in back and legs, and difficulty falling asleep. He is planning to discuss these issues with his PCP. Mr. Shuey reports having transportation and plenty of food. He lives alone, and has support from his sister. Mr. Arocho attended his follow up with PCP. He was working outside at his home today and reports doing "about the same". He reports increased difficulty breathing when it is hot outside. He has his medications and oxygen, reports no needs at this time. He is working with [facility] pharmacist for medication management.Update-Mr. Gucciardo reports his breathing is about the same, worse with the warm temperatures. He needs two medications refilled and request RNCM to call him on Monday at 10am to assist in requesting refills. He is aware of his PCP appointment on 7/28 and has transportation.  He has not been drinking alcohol and reports feeling better since quitting. -Update- Mr. Staiger recently hospitalized for COPD exacerbation, discharged home 8/11. Patient needs discharge medications and reports that he will pick them up today. He using 2L of oxygen continuous and resting. He reports improvement with breathing after using new inhaler Anoro. 08/19/20-RNCM followed up with Mr. Ducey to verify that he was able to pick up all medications and he is taking them as directed. Case Manager Clinical Goal(s): patient will report using inhalers as  prescribed including rinsing mouth after use patient will engage in lite exercise as tolerated to build/regain stamina and strength and reduce shortness of breath through activity tolerance patient will verbalize basic understanding of COPD disease process and self care activities patient will not be hospitalized for COPD exacerbation as evidenced  Patient will work with [facility] pharmacist for medication management-Met Patient will attend all scheduled appointments Interventions:  Inter-disciplinary care team collaboration (see longitudinal plan of care)  Encouraged to avoid being outside during the middle of  the day when it is hot outside Advised patient to have someone pick up prescriptions and start taking as directed. Stressed the importance of taking prescribed medications Provided RNCM contact information for any healthcare related barriers RNCM will follow closely RNCM collaborated with PCP to schedule missed appointment on 8/11. New appointment made for 09/03/20 Collaborate with Opal Sidles re: stationary concentrator Patient Goals/Self-Care Activities:  - take all prescribed medication as directed - attend next appointment with [name] on 8/31  - avoid outdoors during the warmest part of the day - avoid second hand smoke - identify and remove indoor air pollutants - limit outdoor activity during cold weather - listen for public air quality announcements every day  - call PCP with new concerns or questions - develop a new routine to improve sleep - get at least 7 to 8 hours of sleep at night - keep room cool and dark - practice relaxation or meditation daily Follow Up Plan: Telephone follow up appointment with care management team member scheduled for:08/29/20 @ 10am     Patient Care Plan: Medication Management     Problem Identified: Health Promotion or Disease Self-Management (General Plan of Care)      Goal: Medication Management   Note:   Current Barriers:  Unable to  independently monitor therapeutic efficacy Unable to self administer medications as prescribed Does not adhere to prescribed medication regimen Does not maintain contact with provider office Does not contact provider office for questions/concerns   Pharmacist Clinical Goal(s):  Over the next 30 days, patient will contact provider office for questions/concerns as evidenced notation of same in electronic health record through collaboration with PharmD and provider.    Interventions: Inter-disciplinary care team collaboration (see longitudinal plan of care) Comprehensive medication review performed; medication list updated in electronic medical record  @RXCPDIABETES @ @RXCPHYPERTENSION @ @RXCPHYPERLIPIDEMIA @ Health Maintenance  Patient Goals/Self-Care Activities Over the next 30 days, patient will:  - collaborate with provider on medication access solutions  Follow Up Plan: The care management team will reach out to the patient again over the next 30 days.       Patient Care Plan: Wellness (Adult)     Problem Identified: (RNCM) Alcohol Use (Wellness)      Long-Range Goal: Alcohol Use Managed   Start Date: 04/25/2020  Expected End Date: 08/29/2020  Recent Progress: On track  Priority: High  Note:   Current Barriers:  Ineffective Self Health Maintenance-Mr. Gindlesperger was admitted on 4/16-4/21. Patient reports it was because of the alcohol. He wants to quit and feels that he can stop drinking on his own.  Update-Mr. Adelson continues to report no alcohol and no desire to drink.  Does not adhere to provider recommendations re: quit drinking alcohol Currently UNABLE TO independently self manage needs related to chronic health conditions.  Knowledge Deficits related to short term plan for care coordination needs and long term plans for chronic disease management needs Nurse Case Manager Clinical Goal(s):  patient will work with care management team to address care coordination and  chronic disease management needs related to alcohol use   Interventions:  Evaluation of current treatment plan related to alcohol use and quitting and patient's adherence to plan as established by provider. Advised patient to avoid friends/family/situations that drinking is involved, consider counseling for quitting drinking, reach out to supportive friends/family Discussed plans with patient for ongoing care management follow up and provided patient with direct contact information for care management team Provided therapeutic listening Self Care Activities:  Patient will self  administer medications as prescribed Patient will attend all scheduled provider appointments Patient will call pharmacy for medication refills Patient will call provider office for new concerns or questions Patient Goals: - avoid people and places where alcohol is used - develop a plan to avoid relapse  - think about counseling to assist in [place] - work with Ubaldo Glassing for therapy options Follow Up Plan: Telephone follow up appointment with care management team member scheduled for:08/29/20 @ 10am

## 2020-08-20 ENCOUNTER — Telehealth: Payer: Self-pay

## 2020-08-20 NOTE — Telephone Encounter (Signed)
Call placed to [place] regarding standard room O2 concentrator.  Spoke to [place] who stated that the patient, per their records, has a Simply Go POC which delivers continuous O2,  not a Simply Go Mini that has the conserving device.  She explained that the patients who have a Simply Go do not need a room concentrator and can sleep with the Simply Go unit. The insurance will not pay for the Simply Go and the room concentrator.   If he had a Simply Go Mini he would be eligible for the room concentrator.   She will call the patient to confirm the type of POC that he has.

## 2020-08-21 ENCOUNTER — Other Ambulatory Visit: Payer: Self-pay

## 2020-08-25 ENCOUNTER — Other Ambulatory Visit: Payer: Self-pay

## 2020-08-25 ENCOUNTER — Other Ambulatory Visit: Payer: Self-pay | Admitting: Critical Care Medicine

## 2020-08-25 NOTE — Telephone Encounter (Signed)
   Notes to clinic:  Medication filled on 8/12 by different provider  Review for refill    Requested Prescriptions  Pending Prescriptions Disp Refills   predniSONE (DELTASONE) 20 MG tablet 9 tablet 0    Sig: Take 2 tablets (40 mg total) by mouth daily with breakfast for 2 days, THEN 1.5 tablets (30 mg total) daily with breakfast for 2 days, THEN 1 tablet (20 mg total) daily with breakfast for 2 days. Then resume home dose of 10 mg daily.     Not Delegated - Endocrinology:  Oral Corticosteroids Failed - 08/25/2020  1:47 PM      Failed - This refill cannot be delegated      Passed - Last BP in normal range    BP Readings from Last 1 Encounters:  08/13/20 131/75          Passed - Valid encounter within last 6 months    Recent Outpatient Visits           2 months ago Type 2 diabetes mellitus without complication, without long-term current use of insulin ([place])   [place] [name], MD   3 months ago Essential hypertension   [place], Patrick E, MD   4 months ago COPD with acute exacerbation [facility])   [place] [name], MD   6 months ago Type 2 diabetes mellitus without complication, without long-term current use of insulin [facility])   [place] [name], MD   8 months ago Bronchiectasis without complication [facility])   [place], MD       Future Appointments             In 1 week [name], MD [place]   In 1 month [name], MD [place]

## 2020-08-26 ENCOUNTER — Telehealth: Payer: Self-pay | Admitting: Critical Care Medicine

## 2020-08-26 ENCOUNTER — Ambulatory Visit: Payer: Self-pay

## 2020-08-26 NOTE — Telephone Encounter (Signed)
Called pt's sister and told her that her brother has been without oxygen since yesterday. Asked her to bring 2 AAA batteries for his pulse ox and go to see him. Pt's sister upset with him because he didn't tell her hd no oxygen available for him. Advised sister check pulse ox and call 911 if his pulse ox is less than 90% and if isnt feeling well. Told [name] it is better to call 911 than not [name] verbalized understanding.

## 2020-08-26 NOTE — Telephone Encounter (Signed)
Patient called, left VM to return the call to the office to discuss with a nurse.   Message from Sharene Skeans sent at 08/26/2020  9:37 AM EDT  Pts oxygen tank broke this morning/ sister states he is not doing well without it/ she stated he may end up having to go to hospital without it/ Jay'a from [place] advised me to tell her to call [employer] / please check on pt/ NT was not available when calling

## 2020-08-26 NOTE — Telephone Encounter (Signed)
  Called pt's sister , [name] and she stated that the O2 concentrator is broken and he is using the only tank of O2 but it only lasts for two hours. Called [employer] to try and get a ETA from them. During the 3 way call with Tera and Elwyn Lade got the pt's sister contact information for technician inform her of a ETA.  Team messaged [facility]) and asked if he could come to office and use their oxygen. Was advised that could not do that  but stated can the sister drive to [place] to pick up a O2 tank.   Tera from [employer] stated pt will need his PCP to place order for the following: 2 extra "e tanks" and  M60 tank that lasts 24 hours.  Called pt and he stated he has no oxygen and has been out since yesterday. Asked pt to check pulse ox but the batteries are dead. Advised pt to rest as much as possible and to call his sister at the moment he needs to go to the ED. Pt verbalized understanding. Called sister and LM that the pt is out of O2 and needs batteries for his pulse oximeter.    Reason for Disposition  [1] Follow-up call to recent contact AND [2] information only call, no triage required  Answer Assessment - Initial Assessment Questions 1. REASON FOR CALL or QUESTION: "What is your reason for calling today?" or "How can I best help you?" or "What question do you have that I can help answer?"     Needs O2 concentrator  fixed and the only tank he has for oxygen   TANK THAT LAST 2 HOURS.  Protocols used: Information Only Call - No Triage-A-AH

## 2020-08-26 NOTE — Telephone Encounter (Signed)
Pts oxygen tank broke this morning and they wanted to know where or who to call/ advised pt per [facility] for them to contact [place]

## 2020-08-27 ENCOUNTER — Telehealth: Payer: Self-pay | Admitting: Critical Care Medicine

## 2020-08-27 ENCOUNTER — Telehealth: Payer: Self-pay

## 2020-08-27 DIAGNOSIS — J9611 Chronic respiratory failure with hypoxia: Secondary | ICD-10-CM

## 2020-08-27 NOTE — Telephone Encounter (Signed)
Jane:  Not sure how his oxygen Rx got changed.  I am sending another O2 order per the [facility] phone note I was forwarded that was CLOSED so I could not add on.  Refer to that note

## 2020-08-27 NOTE — Telephone Encounter (Signed)
Order for extra E tanks and M60 tank faxed to [place]  Call placed to patient's sister, James [name]. She said that no one has contacted them from [place] about the broken POC.   Call placed to James [name], spoke to James [name] who confirmed that they are aware that patient's POC is broken.  She said that they tried to contact him yesterday and no one answered. The phone number that was tried was for patient's sister, James [name].  [employer] also has patient's phone number on file but James [name] was not sure if that number was also called. Informed her that an  order for back up tanks has been faxed to [employer] ; but patient's POC needs to be assessed and it is urgent that these concerns are addressed.  The patient must have his O2. She said she would notify the technicians today.   Call placed to patient. He said he is not feeling very good but does not think he needs to go to the hospital. Informed him that [place] should be contacting him about the POC and tanks.  He has not obtained batteries for the pulse oximeter.  Explained to him that he can contact EMS to come and check his pulse ox and if needed, they will transport him to ED. It is essential that he has O2.  He said that he understands and will call EMS if necessary.

## 2020-08-28 NOTE — Telephone Encounter (Signed)
Call placed to [place], spoke to [employer] who stated that their technician went to patient's home yesterday and provided him with a new POC. They also reported that he had 2 full "E" tanks at home and he refused the M-60.  Call placed to patient to see how he is doing, voicemail was full , unable to leave a message.

## 2020-08-29 ENCOUNTER — Other Ambulatory Visit: Payer: Self-pay

## 2020-08-29 ENCOUNTER — Other Ambulatory Visit: Payer: Self-pay | Admitting: *Deleted

## 2020-08-29 NOTE — Patient Outreach (Signed)
Medicaid Managed Care   Nurse Care Manager Note  08/29/2020 Name:  James [name] MRN:  [id number] DOB:  12/13/1960  James [name] is an 60 y.o. year old male who is a primary patient of Wright, Patrick E, MD.  The Medicaid Managed Care Coordination team was consulted for assistance with:    COPD Alcohol cessation  James [name] was given information about Medicaid Managed Care Coordination team services today. James [name] Patient agreed to services and verbal consent obtained.  Engaged with patient by telephone for follow up visit in response to provider referral for case management and/or care coordination services.   Assessments/Interventions:  Review of past medical history, allergies, medications, health status, including review of consultants reports, laboratory and other test data, was performed as part of comprehensive evaluation and provision of chronic care management services.  SDOH (Social Determinants of Health) assessments and interventions performed:   Care Plan  Allergies  Allergen Reactions   Gabapentin Other (See Comments)    hallucinations    Medications Reviewed Today     Reviewed by ,  A, RN (Registered Nurse) on 08/29/20 at 1020  Med List Status: <None>   Medication Order Taking? Sig Documenting Provider Last Dose Status Informant  albuterol (PROVENTIL) (2.5 MG/3ML) 0.083% nebulizer solution [id number] Yes TAKE 3 MLS (2.5 MG TOTAL) BY NEBULIZATION IN THE MORNING, AT NOON, AND AT BEDTIME. Wright, Patrick E, MD Taking Active            Med Note (ATKINS, HEATHER L   Mon Aug 11, 2020  6:41 PM) LF 07/10/2020  albuterol (VENTOLIN HFA) 108 (90 Base) MCG/ACT inhaler [id number] Yes Inhale 2 puffs into the lungs every 4 (four) hours as needed for wheezing or shortness of breath. Ward, Kristen N, DO Taking Active Self  budesonide-formoterol (SYMBICORT) 160-4.5 MCG/ACT inhaler [id number] Yes INHALE 2 PUFFS INTO THE LUNGS 2 (TWO) TIMES DAILY. Wright, Patrick E, MD  Taking Active Self           Med Note (ATKINS, HEATHER L   Mon Aug 11, 2020  6:40 PM) LF 06/10/2020  furosemide (LASIX) 20 MG tablet [id number] Yes Take 1 tablet (20 mg total) by mouth daily as needed for edema. Wright, Patrick E, MD Taking Active Self           Med Note (ATKINS, HEATHER L   Mon Aug 11, 2020  6:40 PM) LF 07/14/2020  guaiFENesin (MUCINEX) 600 MG 12 hr tablet [id number] Yes Take 1 tablet (600 mg total) by mouth 2 (two) times daily as needed for cough or to loosen phlegm. Lai, Tina, MD Taking Active   metFORMIN (GLUCOPHAGE) 1000 MG tablet [id number] Yes Take 1 tablet (1,000 mg total) by mouth 2 (two) times daily with a meal. Wright, Patrick E, MD Taking Active            Med Note (SUTPHIN, CHASIE F   Mon Aug 11, 2020  2:33 PM) 90 day supply 07/30/2020  metoprolol succinate (TOPROL-XL) 25 MG 24 hr tablet [id number] Yes Take 1 tablet (25 mg total) by mouth daily. Wright, Patrick E, MD Taking Active Self           Med Note (SUTPHIN, CHASIE F   Mon Aug 11, 2020  2:33 PM) 90 day supply 06/10/2020  pantoprazole (PROTONIX) 40 MG tablet [id number] Yes Take 1 tablet (40 mg total) by mouth daily. Wright, Patrick E, MD Taking Active            Med Note (ATKINS,   HEATHER L   Mon Aug 11, 2020  6:39 PM) LF 07/23/2020  predniSONE (DELTASONE) 10 MG tablet [id number] Yes Take 1 tablet (10 mg total) by mouth daily with breakfast. Elodia Florence., MD Taking Active   Respiratory Therapy Supplies (FLUTTER) DEVI [id number] Yes Use 4 times daily [name], MD Taking Active Self  Tiotropium Bromide Monohydrate 2.5 MCG/ACT AERS [id number] Yes INHALE 2 PUFFS INTO THE LUNGS DAILY. [name], MD Taking Active Self           Med Note Rosine Beat Aug 11, 2020  6:39 PM) LF 07/10/2020            Patient Active Problem List   Diagnosis Date Noted   Acute hypoxemic respiratory failure ([place]) 08/12/2020   COPD with acute exacerbation ([place]) 08/11/2020   CAP (community acquired pneumonia)  06/26/2020   Palliative care encounter 05/26/2020   Alcohol withdrawal syndrome, with delirium ([place])    Obesity (BMI 30.0-34.9) 04/19/2020   Pain of right forearm 12/03/2019   Bronchiectasis ([place]) 09/24/2019   Type 2 diabetes mellitus without complication, without long-term current use of insulin ([place]) 07/24/2019   Other atopic dermatitis 05/01/2019   Chronic respiratory failure with hypoxia ([place]) 04/23/2019   Intellectual disability 04/23/2019   History of alcohol use 11/23/2018   Hypertension 05/17/2016   History of tobacco abuse 04/06/2016   COPD mixed type ([place]) 03/26/2016    Conditions to be addressed/monitored per PCP order:  COPD and alcohol cessation  Care Plan : COPD (Adult)  Updates made by James Montane, RN since 08/29/2020 12:00 AM     Problem: Symptom Exacerbation (COPD)      Long-Range Goal: Symptom Exacerbation Prevented or Minimized   Start Date: 04/09/2020  Expected End Date: 10/29/2020  Recent Progress: On track  Priority: High  Note:   Current Barriers:  Knowledge deficits related to basic COPD self care/management-James [name] was recently admitted for COPD exacerbation and pnuemonia. He reports taking his medications, wearing oxygen continuously, and has a follow up with his PCP on 04/14/20. He does report having difficulty at times swallowing food and liquid, pain in back and legs, and difficulty falling asleep. He is planning to discuss these issues with his PCP. James [name] reports having transportation and plenty of food. He lives alone, and has support from his sister. James [name] attended his follow up with PCP. He was working outside at his home today and reports doing "about the same". He reports increased difficulty breathing when it is hot outside. He has his medications and oxygen, reports no needs at this time. He is working with [facility] pharmacist for medication management.Update-James [name] reports his breathing is about the same, worse with the warm  temperatures. He needs two medications refilled and request RNCM to call him on Monday at 10am to assist in requesting refills. He is aware of his PCP appointment on 7/28 and has transportation.  He has not been drinking alcohol and reports feeling better since quitting.  Mr. Castille recently hospitalized for COPD exacerbation, discharged home 8/11.  He using 2L of oxygen continuous and resting. He reports improvement with breathing after using new inhaler Anoro. -Update- Mr. Favia received a new oxygen concentrator after his quit working. He reports receiving the wrong connector, but had one at home that would work. He reports having and taking all medications as directed, needs refill for protonix.  Case Manager Clinical Goal(s): patient will report using inhalers as prescribed  including rinsing mouth after use patient will engage in lite exercise as tolerated to build/regain stamina and strength and reduce shortness of breath through activity tolerance patient will verbalize basic understanding of COPD disease process and self care activities patient will not be hospitalized for COPD exacerbation as evidenced  Patient will work with [facility] pharmacist for medication management-Met Patient will attend all scheduled appointments Interventions:  Inter-disciplinary care team collaboration (see longitudinal plan of care)  Discussed upcoming appointment with [name]. Advised to take all medications to this appointment Encouraged to avoid being outside during the middle of the day when it is hot outside Advised to call pharmacy for any needed refills Call [employer] for correct connector today Provided RNCM contact information for any healthcare related barriers RNCM will follow closely Provided therapeutic listening Patient Goals/Self-Care Activities:  - take all prescribed medication as directed - attend next appointment with [name] on 8/31-take all of your medications with you to this appointment  -  avoid outdoors during the warmest part of the day - avoid second hand smoke - identify and remove indoor air pollutants - limit outdoor activity during cold weather - listen for public air quality announcements every day  - call PCP with new concerns or questions Follow Up Plan: Telephone follow up appointment with care management team member scheduled for:09/15/20 @ 10:30am     Care Plan : Wellness (Adult)  Updates made by James Montane, RN since 08/29/2020 12:00 AM     Problem: (RNCM) Alcohol Use (Wellness)      Long-Range Goal: Alcohol Use Managed   Start Date: 04/25/2020  Expected End Date: 09/15/2020  Recent Progress: On track  Priority: High  Note:   Current Barriers:  Ineffective Self Health Maintenance-Mr. Wiechmann was admitted on 4/16-4/21. Patient reports it was because of the alcohol. He wants to quit and feels that he can stop drinking on his own.  Update-Mr. Lopes reports feeling better and relates this to not drinking alcohol. He is very glad that he has stopped drinking Does not adhere to provider recommendations re: quit drinking alcohol Currently UNABLE TO independently self manage needs related to chronic health conditions.  Knowledge Deficits related to short term plan for care coordination needs and long term plans for chronic disease management needs Nurse Case Manager Clinical Goal(s):  patient will work with care management team to address care coordination and chronic disease management needs related to alcohol use   Interventions:  Evaluation of current treatment plan related to alcohol use and quitting and patient's adherence to plan as established by provider. Advised patient to avoid friends/family/situations that drinking is involved, consider counseling for quitting drinking, reach out to supportive friends/family Discussed plans with patient for ongoing care management follow up and provided patient with direct contact information for care management  team Provided therapeutic listening Congratulated patient on his progress, provided encouragement Self Care Activities:  Patient will self administer medications as prescribed Patient will attend all scheduled provider appointments Patient will call pharmacy for medication refills Patient will call provider office for new concerns or questions Patient Goals: - avoid people and places where alcohol is used - develop a plan to avoid relapse  - think about counseling to assist in [place] - work with Ubaldo Glassing for therapy options Follow Up Plan: Telephone follow up appointment with care management team member scheduled for:09/15/20 @ 10:30am      Follow Up:  Patient agrees to Care Plan and Follow-up.  Plan: The Managed Medicaid care management  team will reach out to the patient again over the next 15 days.  Date/time of next scheduled RN care management/care coordination outreach:  09/15/20 @ 10:30am    RN, BSN [place]  [facility] RN Care Coordinator  

## 2020-08-29 NOTE — Patient Instructions (Signed)
Visit Information  James [name] was given information about Medicaid Managed Care team care coordination services as a part of their Healthy [facility] Medicaid benefit. James [name] verbally consented to engagement with the [facility] Managed Care team.   If you are experiencing a medical emergency, please call 911 or report to your local emergency department or urgent care.   If you have a non-emergency medical problem during routine business hours, please contact your provider's office and ask to speak with a nurse.   For questions related to your Healthy [facility] health plan, please call: [phone] or visit the homepage here: [email or web address]  If you would like to schedule transportation through your Healthy [facility] plan, please call the following number at least 2 days in advance of your appointment: [phone]  Call the [place] at [phone], at any time, 24 hours a day, 7 days a week. If you are in danger or need immediate medical attention call 911.  If you would like help to quit smoking, call 1-800-QUIT-NOW 807-617-6011) OR Espaol: 1-855-Djelo-Ya (4-709-628-3662) o para ms informacin haga clic aqu or Text READY to 200-400 to register via text  James [name] - following are the goals we discussed in your visit today:   Goals Addressed             This Visit's Progress    Stop or Cut Down Drinking Alcohol       Timeframe:  Long-Range Goal Priority:  High Start Date:   04/25/20                          Expected End Date:  10/29/20                     Follow Up Date 09/15/20   - avoid people and places where alcohol is used - develop a plan to avoid relapse  - think about counseling to assist in [place] - work with James [name]/James [name] for therapy options   Why is this important?   To stop drinking it is important to have support from a person or group of people who you can count on.  You will also need  to think about the things that make you feel like using alcohol; then plan for how to handle them.         Track and Manage My Triggers-COPD       Timeframe:  Long-Range Goal Priority:  High Start Date:  04/09/20                           Expected End Date:   10/2620                    Follow Up Date 09/15/20  - take all prescribed medication as directed - attend next appointment with Dr. Joya [name] on 8/31-take all of your medications with you to this appointment  - avoid outdoors during the warmest part of the day - avoid second hand smoke - identify and remove indoor air pollutants - limit outdoor activity during cold weather - listen for public air quality announcements every day  - call PCP with new concerns or questions    Why is this important?   Triggers are activities or things, like tobacco smoke or cold weather, that make your COPD (chronic obstructive pulmonary disease) flare-up.  Knowing these triggers helps you plan how to stay away  from them.  When you cannot remove them, you can learn how to manage them.             Please see education materials related to COPD provided by MyChart link.  Patient has access to MyChart for viewing education provided  Telephone follow up appointment with Managed Medicaid care management team member scheduled for:09/15/20 @ 10:30am  James Joiner RN, BSN [place] RN Care Coordinator   Following is a copy of your plan of care:  Patient Care Plan: COPD (Adult)     Problem Identified: Symptom Exacerbation (COPD)      Long-Range Goal: Symptom Exacerbation Prevented or Minimized   Start Date: 04/09/2020  Expected End Date: 10/29/2020  Recent Progress: On track  Priority: High  Note:   Current Barriers:  Knowledge deficits related to basic COPD self care/management-James [name] was recently admitted for COPD exacerbation and pnuemonia. He reports taking his medications, wearing oxygen continuously, and  has a follow up with his PCP on 04/14/20. He does report having difficulty at times swallowing food and liquid, pain in back and legs, and difficulty falling asleep. He is planning to discuss these issues with his PCP. James [name] reports having transportation and plenty of food. He lives alone, and has support from his sister. James [name] attended his follow up with PCP. He was working outside at his home today and reports doing "about the same". He reports increased difficulty breathing when it is hot outside. He has his medications and oxygen, reports no needs at this time. He is working with [facility] pharmacist for medication management.Update-James [name] reports his breathing is about the same, worse with the warm temperatures. He needs two medications refilled and request RNCM to call him on Monday at 10am to assist in requesting refills. He is aware of his PCP appointment on 7/28 and has transportation.  He has not been drinking alcohol and reports feeling better since quitting.  James [name] recently hospitalized for COPD exacerbation, discharged home 8/11.  He using 2L of oxygen continuous and resting. He reports improvement with breathing after using new inhaler Anoro. -Update- James [name] received a new oxygen concentrator after his quit working. He reports receiving the wrong connector, but had one at home that would work. He reports having and taking all medications as directed, needs refill for protonix.  Case Manager Clinical Goal(s): patient will report using inhalers as prescribed including rinsing mouth after use patient will engage in lite exercise as tolerated to build/regain stamina and strength and reduce shortness of breath through activity tolerance patient will verbalize basic understanding of COPD disease process and self care activities patient will not be hospitalized for COPD exacerbation as evidenced  Patient will work with [facility] pharmacist for medication management-Met Patient will attend all  scheduled appointments Interventions:  Inter-disciplinary care team collaboration (see longitudinal plan of care)  Discussed upcoming appointment with Dr. Joya [name]. Advised to take all medications to this appointment Encouraged to avoid being outside during the middle of the day when it is hot outside Advised to call pharmacy for any needed refills Call [employer] for correct connector today Provided RNCM contact information for any healthcare related barriers RNCM will follow closely Provided therapeutic listening Patient Goals/Self-Care Activities:  - take all prescribed medication as directed - attend next appointment with Dr. Joya [name] on 8/31-take all of your medications with you to this appointment  - avoid outdoors during the warmest part of the day - avoid second hand smoke -  identify and remove indoor air pollutants - limit outdoor activity during cold weather - listen for public air quality announcements every day  - call PCP with new concerns or questions Follow Up Plan: Telephone follow up appointment with care management team member scheduled for:09/15/20 @ 10:30am     Patient Care Plan: Medication Management     Problem Identified: Health Promotion or Disease Self-Management (General Plan of Care)      Goal: Medication Management   Note:   Current Barriers:  Unable to independently monitor therapeutic efficacy Unable to self administer medications as prescribed Does not adhere to prescribed medication regimen Does not maintain contact with provider office Does not contact provider office for questions/concerns   Pharmacist Clinical Goal(s):  Over the next 30 days, patient will contact provider office for questions/concerns as evidenced notation of same in electronic health record through collaboration with PharmD and provider.    Interventions: Inter-disciplinary care team collaboration (see longitudinal plan of care) Comprehensive medication review performed;  medication list updated in electronic medical record   Health Maintenance  Patient Goals/Self-Care Activities Over the next 30 days, patient will:  - collaborate with provider on medication access solutions  Follow Up Plan: The care management team will reach out to the patient again over the next 30 days.       Patient Care Plan: Wellness (Adult)     Problem Identified: (RNCM) Alcohol Use (Wellness)      Long-Range Goal: Alcohol Use Managed   Start Date: 04/25/2020  Expected End Date: 09/15/2020  Recent Progress: On track  Priority: High  Note:   Current Barriers:  Ineffective Self Health Maintenance-James [name] was admitted on 4/16-4/21. Patient reports it was because of the alcohol. He wants to quit and feels that he can stop drinking on his own.  Update-James [name] reports feeling better and relates this to not drinking alcohol. He is very glad that he has stopped drinking Does not adhere to provider recommendations re: quit drinking alcohol Currently UNABLE TO independently self manage needs related to chronic health conditions.  Knowledge Deficits related to short term plan for care coordination needs and long term plans for chronic disease management needs Nurse Case Manager Clinical Goal(s):  patient will work with care management team to address care coordination and chronic disease management needs related to alcohol use   Interventions:  Evaluation of current treatment plan related to alcohol use and quitting and patient's adherence to plan as established by provider. Advised patient to avoid friends/family/situations that drinking is involved, consider counseling for quitting drinking, reach out to supportive friends/family Discussed plans with patient for ongoing care management follow up and provided patient with direct contact information for care management team Provided therapeutic listening Congratulated patient on his progress, provided encouragement Self Care  Activities:  Patient will self administer medications as prescribed Patient will attend all scheduled provider appointments Patient will call pharmacy for medication refills Patient will call provider office for new concerns or questions Patient Goals: - avoid people and places where alcohol is used - develop a plan to avoid relapse  - think about counseling to assist in [place] - work with Ubaldo Glassing for therapy options Follow Up Plan: Telephone follow up appointment with care management team member scheduled for:09/15/20 @ 10:30am

## 2020-09-03 ENCOUNTER — Ambulatory Visit: Payer: Medicaid Other | Attending: Critical Care Medicine | Admitting: Critical Care Medicine

## 2020-09-03 ENCOUNTER — Other Ambulatory Visit: Payer: Self-pay

## 2020-09-03 ENCOUNTER — Telehealth: Payer: Self-pay

## 2020-09-03 ENCOUNTER — Encounter: Payer: Self-pay | Admitting: Critical Care Medicine

## 2020-09-03 VITALS — BP 132/83 | HR 86 | Resp 16 | Wt 202.2 lb

## 2020-09-03 DIAGNOSIS — Z9981 Dependence on supplemental oxygen: Secondary | ICD-10-CM | POA: Diagnosis not present

## 2020-09-03 DIAGNOSIS — I10 Essential (primary) hypertension: Secondary | ICD-10-CM | POA: Diagnosis not present

## 2020-09-03 DIAGNOSIS — J449 Chronic obstructive pulmonary disease, unspecified: Secondary | ICD-10-CM | POA: Diagnosis not present

## 2020-09-03 DIAGNOSIS — J9611 Chronic respiratory failure with hypoxia: Secondary | ICD-10-CM | POA: Diagnosis not present

## 2020-09-03 DIAGNOSIS — Z79899 Other long term (current) drug therapy: Secondary | ICD-10-CM | POA: Diagnosis not present

## 2020-09-03 DIAGNOSIS — J441 Chronic obstructive pulmonary disease with (acute) exacerbation: Secondary | ICD-10-CM

## 2020-09-03 DIAGNOSIS — Z7984 Long term (current) use of oral hypoglycemic drugs: Secondary | ICD-10-CM | POA: Diagnosis not present

## 2020-09-03 DIAGNOSIS — Z8249 Family history of ischemic heart disease and other diseases of the circulatory system: Secondary | ICD-10-CM | POA: Diagnosis not present

## 2020-09-03 DIAGNOSIS — Z23 Encounter for immunization: Secondary | ICD-10-CM | POA: Diagnosis not present

## 2020-09-03 DIAGNOSIS — Z87891 Personal history of nicotine dependence: Secondary | ICD-10-CM | POA: Insufficient documentation

## 2020-09-03 DIAGNOSIS — E119 Type 2 diabetes mellitus without complications: Secondary | ICD-10-CM | POA: Insufficient documentation

## 2020-09-03 LAB — GLUCOSE, POCT (MANUAL RESULT ENTRY): POC Glucose: 260 mg/dl — AB (ref 70–99)

## 2020-09-03 MED ORDER — METOPROLOL SUCCINATE ER 25 MG PO TB24
25.0000 mg | ORAL_TABLET | Freq: Every day | ORAL | 3 refills | Status: DC
  Filled 2020-09-03 – 2020-09-11 (×2): qty 90, 90d supply, fill #0
  Filled 2020-12-19: qty 90, 90d supply, fill #1

## 2020-09-03 MED ORDER — PREDNISONE 10 MG PO TABS
10.0000 mg | ORAL_TABLET | Freq: Every day | ORAL | 2 refills | Status: DC
  Filled 2020-09-03: qty 60, 60d supply, fill #0
  Filled 2020-09-23: qty 30, 30d supply, fill #0
  Filled 2020-10-23: qty 30, 30d supply, fill #1
  Filled 2020-11-24: qty 30, 30d supply, fill #2

## 2020-09-03 MED ORDER — PANTOPRAZOLE SODIUM 40 MG PO TBEC
40.0000 mg | DELAYED_RELEASE_TABLET | Freq: Every day | ORAL | 6 refills | Status: DC
  Filled 2020-09-03: qty 90, 90d supply, fill #0
  Filled 2020-09-11: qty 60, 60d supply, fill #0
  Filled 2020-11-24: qty 60, 60d supply, fill #1

## 2020-09-03 MED ORDER — ALBUTEROL SULFATE HFA 108 (90 BASE) MCG/ACT IN AERS
2.0000 | INHALATION_SPRAY | RESPIRATORY_TRACT | 0 refills | Status: DC | PRN
  Filled 2020-09-03 – 2020-09-11 (×2): qty 8.5, 25d supply, fill #0

## 2020-09-03 MED ORDER — TRELEGY ELLIPTA 200-62.5-25 MCG/INH IN AEPB
1.0000 | INHALATION_SPRAY | Freq: Every day | RESPIRATORY_TRACT | 3 refills | Status: DC
  Filled 2020-09-03 – 2020-09-11 (×2): qty 60, 30d supply, fill #0

## 2020-09-03 MED ORDER — ALBUTEROL SULFATE (2.5 MG/3ML) 0.083% IN NEBU
INHALATION_SOLUTION | RESPIRATORY_TRACT | 1 refills | Status: DC
Start: 2020-09-03 — End: 2020-12-22
  Filled 2020-09-03: qty 270, 30d supply, fill #0
  Filled 2020-09-11: qty 270, 10d supply, fill #0

## 2020-09-03 MED ORDER — METFORMIN HCL ER 500 MG PO TB24
1000.0000 mg | ORAL_TABLET | Freq: Every day | ORAL | 2 refills | Status: DC
  Filled 2020-09-03 – 2020-09-11 (×2): qty 120, 60d supply, fill #0

## 2020-09-03 MED ORDER — FUROSEMIDE 20 MG PO TABS
20.0000 mg | ORAL_TABLET | Freq: Every day | ORAL | 2 refills | Status: DC | PRN
Start: 2020-09-03 — End: 2020-12-22
  Filled 2020-09-03: qty 30, 30d supply, fill #0
  Filled 2020-09-23: qty 90, 90d supply, fill #0

## 2020-09-03 NOTE — Assessment & Plan Note (Signed)
Mixed COPD has quit smoking quit using alcohol needs to reduce his physical activity outdoors and wear oxygen more often at 2 L continuous  Will switch to Trelegy 200 mcg strength 1 puff daily discontinue Anoro discontinue Spiriva and Symbicort refill prednisone get oxygen concentrator connector fixed

## 2020-09-03 NOTE — Assessment & Plan Note (Signed)
Recent COPD exacerbation has resolved

## 2020-09-03 NOTE — Assessment & Plan Note (Signed)
Continue oxygen 2 L continuous ambulating about the office on 2 L continuous oxygen via concentrator [place] remained at 98%

## 2020-09-03 NOTE — Telephone Encounter (Signed)
PA for Trelegy approved until 09/03/21

## 2020-09-03 NOTE — Patient Instructions (Signed)
Refills on all medications  Start Trelegy one puff daily  Stop symbicort and spiriva  We will get your oxygen hose fixed  We changed your metformin to XR two daily  Return [name] 3 weeks  Labs today were done  Flu vaccine given

## 2020-09-03 NOTE — Telephone Encounter (Signed)
At request of [name], met with the patient when he was in the clinic today.   He is in need of connector for O2 tubing ( male-male connector) in order to allow him to utilize the O2 extension tubing.  Informed him that this CM will notify [employer].   His food stamps have been discontinued and he could benefit from referral for food assistance. He was in agreement to placing a referral to [facility].  He is also agreeable to have James [name], EMT re-instate community paramedicine services if they are able to accept another referral for him,. This CM to check with James [name], EMT   This CM spoke to [facility] and informed her of the patient's need for O2 tubing connector and she said she would place the request for the connector

## 2020-09-03 NOTE — Assessment & Plan Note (Signed)
Switch to extended release metformin 1000 mg daily

## 2020-09-03 NOTE — Assessment & Plan Note (Signed)
Hypertension well controlled continue Lopressor and losartan check metabolic panel for potassium levels

## 2020-09-03 NOTE — Progress Notes (Signed)
Established Patient Office Visit  Subjective:  Patient ID: James [name], male    DOB: 06-09-1960  Age: 60 y.o. MRN: [id number]  CC:  Chief Complaint  Patient presents with   Follow-up    HPI James [name] presents for post hospital follow-up.  He was admitted yet again between the eighth and 10 August below is a copy of the discharge   Admit date: 08/11/2020 Discharge date: 08/13/2020   Time spent: 40 minutes   Recommendations for Outpatient Follow-up:  Follow outpatient CBC/CMP (attention to leukocytosis with elevated procal here at discharge) Follow BP (losartan d/c'd during admission due to hyperkalemia, BP reasonable at discharge) Follow steroid taper, ensure he has home steroid dose (refill sent on discharge) Follow final cultures (Bcx NGTDx2 days at discharge), discharged on 3 days abx Follow o2 requirement outpatient, discharged on home 2 L    Discharge Diagnoses:  Active Problems:   COPD with acute exacerbation ([place])   Acute hypoxemic respiratory failure ([place])     Discharge Condition: stable   Diet recommendation: heart healthy   There were no vitals filed for this visit.   History of present illness:  James [name] is a 60 y.o. male with a past medical history of COPD with chronic hypoxic respiratory failure on 2 L oxygen at baseline, hypertension, former tobacco use, former alcohol dependence, diabetes mellitus.   He was admitted for a COPD exacerbation, requiring bipap at presentation.  He's improved with steroids, nebs, and antibiotics.    Stable for discharge on 8/10.  See below and previous notes for additional details.    Hospital Course:  Acute on Chronic Hypoxic and Hypercapneic Respiratory Failure  COPD Exacerbation - required bipap at presentation - blood gas improved - blood cx NG x2, RVP negative - negative covid - negative MRSA - urine strep, urine legionella pending collection at discharge, sputum cx pending collection - Procalcitonin  was notably elevated, but is downtrending with antibiotics - afebrile x24 hrs - leukocytosis is downtrending  - CXR 8/8 with minimal bibasilar subsegmental atelectasis or scarring - he's close to his baseline today, plan to discharge with antibiotics and steroid taper    Elevated Procalcitonin - he's on abx for COPD exacerbation, procal honestly more elevated than I would expect for COPD exacerbation without clear pneumonia - leukocytosis is improving, he's afebrile x24 hrs, blood cultures are no growth - recommend following clinically outpatient    Acute metabolic encephalopathy -A present with confusion, CT head no acute findings -Confusion appear has resolved   Hyperkalemia Noted on ARB at home Currently ARB inhibitor on hold He received 1 dose of Lokelma Potassium normalized   Hypertension On Lopressor and ARB at home Resume Lopressor, continue hold ARB inhibitor at discharge with reasonable BP   Procedures: none   Consultations: None  Post hospital issues include needing a connector for his oxygen tubing on his new oxygen concentrator.  He was given Anoro inhaler in the hospital and likes this better than the Symbicort and Spiriva  Patient needs refills on his medications and needs an alternative to the current metformin he is using  Patient is coughing less less wheezy.  He has been trying to do yard work without oxygen despite being advised against this.  Also inquiring about a Medicaid supported meal plan and perhaps getting EMT back out to see him again   Past Medical History:  Diagnosis Date   Acute on chronic respiratory failure with hypoxia ([place]) 06/08/2013   CAP (  community acquired pneumonia) 05/17/2018   See admit 05/17/18 ? rml  with   covid pcr neg - rx augmentin > f/u cxr in 4-6 weeks is fine unless condition declines    Community acquired pneumonia of right lower lobe of lung 05/17/2018   See admit 05/17/18 ? 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   Tick bite 06/03/2018    Past Surgical History:  Procedure Laterality Date   VIDEO ASSISTED THORACOSCOPY (VATS)/THOROCOTOMY Left 06/11/2013   Procedure: VIDEO ASSISTED THORACOSCOPY (VATS)/THOROCOTOMY;  Surgeon: Ivin Poot, MD;  Location: [facility];  Service: Thoracic;  Laterality: Left;   VIDEO BRONCHOSCOPY N/A 06/11/2013   Procedure: VIDEO BRONCHOSCOPY;  Surgeon: Ivin Poot, MD;  Location: [facility];  Service: Thoracic;  Laterality: N/A;   VIDEO BRONCHOSCOPY N/A 08/10/2018   Procedure: VIDEO BRONCHOSCOPY WITH FLUORO;  Surgeon: Candee Furbish, MD;  Location: [facility] ENDOSCOPY;  Service: Endoscopy;  Laterality: N/A;    Family History  Problem Relation Age of Onset   Heart disease Father    COPD Sister     Social History   Socioeconomic History   Marital status: Divorced    Spouse name: Not on file   Number of children: Not on file   Years of education: Not on file   Highest education level: Not on file  Occupational History   Occupation: Dealer   Occupation: Curator  Tobacco Use   Smoking status: Former    Packs/day: 1.50    Years: 40.00    Pack years: 60.00    Types: Cigarettes    Quit date: 2014    Years since quitting: 8.6   Smokeless tobacco: Former    Types: Nurse, children's Use: Never used  Substance and Sexual Activity   Alcohol use: Yes    Alcohol/week: 28.0 standard drinks    Types: 28 Cans of beer per week    Comment: 4 beers a night; + jitters with not drinking, denies DTs or seizures   Drug use: Yes    Types: Marijuana    Comment: daily; quit using crack and methamphetamine about 5 months ago    Sexual activity: Not Currently    Partners:  Female  Other Topics Concern   Not on file  Social History Narrative   Lives alone near [place] Determinants of Health   Financial Resource Strain: Not on file  Food Insecurity: No Food Insecurity   Worried About Charity fundraiser in the Last Year: Never true   Ran Out of Food in the Last Year: Never true  Transportation Needs: No Transportation Needs   Lack of Transportation (Medical): No   Lack of Transportation (Non-Medical): No  Physical Activity: Not on file  Stress: Not on file  Social Connections: Not on file  Intimate Partner Violence: Not on file    Outpatient Medications Prior to Visit  Medication Sig Dispense Refill   guaiFENesin (MUCINEX) 600 MG 12 hr tablet Take 1 tablet (600 mg total) by mouth 2 (two) times daily as needed for cough or  to loosen phlegm.     Respiratory Therapy Supplies (FLUTTER) DEVI Use 4 times daily 1 each 0   albuterol (PROVENTIL) (2.5 MG/3ML) 0.083% nebulizer solution TAKE 3 MLS (2.5 MG TOTAL) BY NEBULIZATION IN THE MORNING, AT NOON, AND AT BEDTIME. 270 mL 1   albuterol (VENTOLIN HFA) 108 (90 Base) MCG/ACT inhaler Inhale 2 puffs into the lungs every 4 (four) hours as needed for wheezing or shortness of breath. 1 each 0   budesonide-formoterol (SYMBICORT) 160-4.5 MCG/ACT inhaler INHALE 2 PUFFS INTO THE LUNGS 2 (TWO) TIMES DAILY. 10.2 g 12   furosemide (LASIX) 20 MG tablet Take 1 tablet (20 mg total) by mouth daily as needed for edema. 30 tablet 2   metoprolol succinate (TOPROL-XL) 25 MG 24 hr tablet Take 1 tablet (25 mg total) by mouth daily. 90 tablet 3   pantoprazole (PROTONIX) 40 MG tablet Take 1 tablet (40 mg total) by mouth daily. 30 tablet 6   predniSONE (DELTASONE) 10 MG tablet Take 1 tablet (10 mg total) by mouth daily with breakfast. 30 tablet 0   Tiotropium Bromide Monohydrate 2.5 MCG/ACT AERS INHALE 2 PUFFS INTO THE LUNGS DAILY. 4 g 6   metFORMIN (GLUCOPHAGE) 1000 MG tablet Take 1 tablet (1,000 mg total) by mouth 2 (two)  times daily with a meal. (Patient not taking: Reported on 09/03/2020) 180 tablet 3   No facility-administered medications prior to visit.    Allergies  Allergen Reactions   Gabapentin Other (See Comments)    hallucinations    ROS Review of Systems  HENT: Negative.    Respiratory:  Positive for cough, shortness of breath and wheezing.   Cardiovascular: Negative.   Gastrointestinal: Negative.   Genitourinary: Negative.   Musculoskeletal: Negative.      Objective:    Physical Exam Constitutional:      Appearance: He is normal weight.  HENT:     Head: Normocephalic and atraumatic.     Nose: Nose normal.     Mouth/Throat:     Mouth: Mucous membranes are moist.     Pharynx: Oropharynx is clear.  Eyes:     Extraocular Movements: Extraocular movements intact.     Conjunctiva/sclera: Conjunctivae normal.     Pupils: Pupils are equal, round, and reactive to light.  Cardiovascular:     Rate and Rhythm: Normal rate and regular rhythm.     Pulses: Normal pulses.     Heart sounds: Normal heart sounds.  Pulmonary:     Effort: Pulmonary effort is normal. No respiratory distress.     Breath sounds: Normal breath sounds. No wheezing, rhonchi or rales.  Chest:     Chest wall: No tenderness.  Abdominal:     General: Bowel sounds are normal.     Palpations: Abdomen is soft.  Musculoskeletal:        General: Normal range of motion.     Cervical back: Normal range of motion.  Skin:    General: Skin is warm.  Neurological:     General: No focal deficit present.     Mental Status: Mental status is at baseline.  Psychiatric:        Mood and Affect: Mood normal.        Behavior: Behavior normal.    BP 132/83   Pulse 86   Resp 16   Wt 202 lb 3.2 oz (91.7 kg)   SpO2 96%   BMI 29.86 kg/m  Wt Readings from Last 3 Encounters:  09/03/20 202 lb 3.2 oz (91.7  kg)  06/26/20 204 lb (92.5 kg)  06/10/20 204 lb 6.4 oz (92.7 kg)     Health Maintenance Due  Topic Date Due    OPHTHALMOLOGY EXAM  Never done   URINE MICROALBUMIN  Never done    There are no preventive care reminders to display for this patient.  Lab Results  Component Value Date   TSH 0.325 (L) 04/19/2020   Lab Results  Component Value Date   WBC 16.5 (H) 08/13/2020   HGB 12.2 (L) 08/13/2020   HCT 37.9 (L) 08/13/2020   MCV 92.2 08/13/2020   PLT 291 08/13/2020   Lab Results  Component Value Date   NA 140 08/13/2020   K 4.2 08/13/2020   CO2 30 08/13/2020   GLUCOSE 98 08/13/2020   BUN 23 (H) 08/13/2020   CREATININE 0.76 08/13/2020   BILITOT 0.4 06/26/2020   ALKPHOS 33 (L) 06/26/2020   AST 27 06/26/2020   ALT 25 06/26/2020   PROT 5.9 (L) 06/26/2020   ALBUMIN 3.8 06/26/2020   CALCIUM 9.6 08/13/2020   ANIONGAP 5 08/13/2020   EGFR 88 06/10/2020   No results found for: CHOL No results found for: HDL No results found for: [facility] Lab Results  Component Value Date   TRIG 95 10/21/2019   No results found for: CHOLHDL Lab Results  Component Value Date   HGBA1C 6.5 (H) 06/26/2020      Assessment & Plan:   Problem List Items Addressed This Visit       Cardiovascular and Mediastinum   Hypertension (Chronic)    Hypertension well controlled continue Lopressor and losartan check metabolic panel for potassium levels      Relevant Medications   metoprolol succinate (TOPROL-XL) 25 MG 24 hr tablet   furosemide (LASIX) 20 MG tablet   Other Relevant Orders   Comprehensive metabolic panel   CBC with Differential/Platelet     Respiratory   COPD mixed type (HCC)    Mixed COPD has quit smoking quit using alcohol needs to reduce his physical activity outdoors and wear oxygen more often at 2 L continuous  Will switch to Trelegy 200 mcg strength 1 puff daily discontinue Anoro discontinue Spiriva and Symbicort refill prednisone get oxygen concentrator connector fixed      Relevant Medications   predniSONE (DELTASONE) 10 MG tablet   albuterol (PROVENTIL) (2.5 MG/3ML) 0.083%  nebulizer solution   albuterol (PROAIR HFA) 108 (90 Base) MCG/ACT inhaler   Fluticasone-Umeclidin-Vilant (TRELEGY ELLIPTA) 200-62.5-25 MCG/INH AEPB   Chronic respiratory failure with hypoxia (HCC)    Continue oxygen 2 L continuous ambulating about the office on 2 L continuous oxygen via concentrator James [name] remained at 98%      RESOLVED: COPD with acute exacerbation (HCC)    Recent COPD exacerbation has resolved      Relevant Medications   predniSONE (DELTASONE) 10 MG tablet   albuterol (PROVENTIL) (2.5 MG/3ML) 0.083% nebulizer solution   albuterol (PROAIR HFA) 108 (90 Base) MCG/ACT inhaler   Fluticasone-Umeclidin-Vilant (TRELEGY ELLIPTA) 200-62.5-25 MCG/INH AEPB     Endocrine   Type 2 diabetes mellitus without complication, without long-term current use of insulin (HCC) - Primary    Switch to extended release metformin 1000 mg daily      Relevant Medications   metFORMIN (GLUCOPHAGE XR) 500 MG 24 hr tablet   Other Relevant Orders   POCT glucose (manual entry) (Completed)   Microalbumin / creatinine urine ratio   Comprehensive metabolic panel   Other Visit Diagnoses     Need  for immunization against influenza       Relevant Orders   Flu Vaccine QUAD 61moIM (Fluarix, Fluzone & Alfiuria Quad PF) (Completed)       Meds ordered this encounter  Medications   metoprolol succinate (TOPROL-XL) 25 MG 24 hr tablet    Sig: Take 1 tablet (25 mg total) by mouth daily.    Dispense:  90 tablet    Refill:  3   pantoprazole (PROTONIX) 40 MG tablet    Sig: Take 1 tablet (40 mg total) by mouth daily.    Dispense:  60 tablet    Refill:  6   predniSONE (DELTASONE) 10 MG tablet    Sig: Take 1 tablet (10 mg total) by mouth daily with breakfast.    Dispense:  60 tablet    Refill:  2   albuterol (PROVENTIL) (2.5 MG/3ML) 0.083% nebulizer solution    Sig: TAKE 3 MLS (2.5 MG TOTAL) BY NEBULIZATION IN THE MORNING, AT NOON, AND AT BEDTIME.    Dispense:  270 mL    Refill:  1   albuterol  (PROAIR HFA) 108 (90 Base) MCG/ACT inhaler    Sig: Inhale 2 puffs into the lungs every 4 (four) hours as needed.    Dispense:  8.5 g    Refill:  0   metFORMIN (GLUCOPHAGE XR) 500 MG 24 hr tablet    Sig: Take 2 tablets (1,000 mg total) by mouth daily with breakfast.    Dispense:  120 tablet    Refill:  2   furosemide (LASIX) 20 MG tablet    Sig: Take 1 tablet (20 mg total) by mouth daily as needed for edema.    Dispense:  30 tablet    Refill:  2   Fluticasone-Umeclidin-Vilant (TRELEGY ELLIPTA) 200-62.5-25 MCG/INH AEPB    Sig: Inhale 1 puff into the lungs daily.    Dispense:  60 each    Refill:  3   Flu vaccine given Follow-up: Return in about 3 weeks (around 09/24/2020).    PAsencion Noble MD

## 2020-09-04 ENCOUNTER — Other Ambulatory Visit: Payer: Self-pay

## 2020-09-04 ENCOUNTER — Telehealth: Payer: Self-pay

## 2020-09-04 DIAGNOSIS — F79 Unspecified intellectual disabilities: Secondary | ICD-10-CM | POA: Diagnosis not present

## 2020-09-04 DIAGNOSIS — J9621 Acute and chronic respiratory failure with hypoxia: Secondary | ICD-10-CM | POA: Diagnosis not present

## 2020-09-04 DIAGNOSIS — J9601 Acute respiratory failure with hypoxia: Secondary | ICD-10-CM | POA: Diagnosis not present

## 2020-09-04 LAB — CBC WITH DIFFERENTIAL/PLATELET
Basophils Absolute: 0.1 x10E3/uL (ref 0.0–0.2)
Basos: 1 %
EOS (ABSOLUTE): 0.2 x10E3/uL (ref 0.0–0.4)
Eos: 2 %
Hematocrit: 40.6 % (ref 37.5–51.0)
Hemoglobin: 13 g/dL (ref 13.0–17.7)
Immature Grans (Abs): 0 x10E3/uL (ref 0.0–0.1)
Immature Granulocytes: 0 %
Lymphocytes Absolute: 2.1 x10E3/uL (ref 0.7–3.1)
Lymphs: 29 %
MCH: 28.4 pg (ref 26.6–33.0)
MCHC: 32 g/dL (ref 31.5–35.7)
MCV: 89 fL (ref 79–97)
Monocytes Absolute: 0.8 x10E3/uL (ref 0.1–0.9)
Monocytes: 10 %
Neutrophils Absolute: 4.3 x10E3/uL (ref 1.4–7.0)
Neutrophils: 58 %
Platelets: 320 x10E3/uL (ref 150–450)
RBC: 4.57 x10E6/uL (ref 4.14–5.80)
RDW: 12.8 % (ref 11.6–15.4)
WBC: 7.4 x10E3/uL (ref 3.4–10.8)

## 2020-09-04 LAB — COMPREHENSIVE METABOLIC PANEL WITH GFR
ALT: 17 IU/L (ref 0–44)
AST: 18 IU/L (ref 0–40)
Albumin/Globulin Ratio: 2.9 — ABNORMAL HIGH (ref 1.2–2.2)
Albumin: 4.4 g/dL (ref 3.8–4.9)
Alkaline Phosphatase: 62 IU/L (ref 44–121)
BUN/Creatinine Ratio: 15 (ref 9–20)
BUN: 15 mg/dL (ref 6–24)
Bilirubin Total: 0.2 mg/dL (ref 0.0–1.2)
CO2: 28 mmol/L (ref 20–29)
Calcium: 9.9 mg/dL (ref 8.7–10.2)
Chloride: 100 mmol/L (ref 96–106)
Creatinine, Ser: 0.99 mg/dL (ref 0.76–1.27)
Globulin, Total: 1.5 g/dL (ref 1.5–4.5)
Glucose: 211 mg/dL — ABNORMAL HIGH (ref 65–99)
Potassium: 4.3 mmol/L (ref 3.5–5.2)
Sodium: 143 mmol/L (ref 134–144)
Total Protein: 5.9 g/dL — ABNORMAL LOW (ref 6.0–8.5)
eGFR: 88 mL/min/1.73

## 2020-09-04 NOTE — Telephone Encounter (Signed)
Contacted pt to go over lab results pt is aware and doesn't have any questions or concerns 

## 2020-09-04 NOTE — Telephone Encounter (Signed)
Call placed to patient who said he has been contacted by [employer] and they will be delivering the O2 tubing connectors today.  Regarding the referral for food assistance. He said he is not really interested in receiving food, only food stamps and would like assistance with applying again as he is not sure why they were discontinued.  Referral sent to Manuela Schwartz [facility] requesting assistance with food stamp application.

## 2020-09-10 ENCOUNTER — Other Ambulatory Visit: Payer: Self-pay

## 2020-09-11 ENCOUNTER — Other Ambulatory Visit: Payer: Self-pay

## 2020-09-11 ENCOUNTER — Telehealth: Payer: Self-pay

## 2020-09-11 NOTE — Telephone Encounter (Signed)
Spoke to patient when he was in the clinic today and he said that [employer] delivered the connectors for his O2 tubing.  He said that he has not heard about the food stamps yet.  Informed him that Manuela Schwartz from [employer] should be contacting him about the food stamps and if he does not hear from her to please call me back.

## 2020-09-15 ENCOUNTER — Telehealth: Payer: Self-pay | Admitting: Critical Care Medicine

## 2020-09-15 ENCOUNTER — Other Ambulatory Visit: Payer: Self-pay

## 2020-09-15 ENCOUNTER — Other Ambulatory Visit: Payer: Self-pay | Admitting: *Deleted

## 2020-09-15 MED ORDER — LOSARTAN POTASSIUM 50 MG PO TABS
50.0000 mg | ORAL_TABLET | Freq: Every day | ORAL | 1 refills | Status: DC
  Filled 2020-09-15: qty 90, 90d supply, fill #0
  Filled 2020-12-19: qty 90, 90d supply, fill #1

## 2020-09-15 NOTE — Telephone Encounter (Signed)
-----   Message from [name], RN sent at 09/15/2020  1:58 PM EDT ----- Regarding: RE: Medication needs Thank you for clarifying. In your last note, it stated to continue losartan. He thought he was to continue taking it. Can someone reach out to him regarding his medication questions? Thank you  ----- Message ----- From: [name], MD Sent: 09/15/2020   1:04 PM EDT To: [name], RN Subject: RE: Medication needs                           The losartan was stopped due to AKI and hyperkalemia.   I already made the switch to the long acting meformin at last OV ----- Message ----- From: [name], RN Sent: 09/15/2020  11:01 AM EDT To: [name], MD Subject: Medication needs                               Hi [name],  This patient is requesting a refill for losartan, he took his last one this morning. It appears that it was removed from his list during last admission. Can you please resend? Also, [name] reports not tolerating the "oval" Metformin prescribed once daily. He has not been taking for about a month. He says that he does better with "round" Metformin when he takes it twice a day. Can you make this change? Thank you  [name] RN, BSN [place] RN Care Coordinator

## 2020-09-15 NOTE — Patient Instructions (Signed)
Visit Information  [name] was given information about Medicaid Managed Care team care coordination services as a part of their Healthy [facility] Medicaid benefit. James [name] verbally consented to engagement with the [facility] Managed Care team.   If you are experiencing a medical emergency, please call 911 or report to your local emergency department or urgent care.   If you have a non-emergency medical problem during routine business hours, please contact your provider's office and ask to speak with a nurse.   For questions related to your Healthy [facility] health plan, please call: [phone] or visit the homepage here: [email or web address]  If you would like to schedule transportation through your Healthy [facility] plan, please call the following number at least 2 days in advance of your appointment: [phone]  Call the [place] at [phone], at any time, 24 hours a day, 7 days a week. If you are in danger or need immediate medical attention call 911.  If you would like help to quit smoking, call 1-800-QUIT-NOW 312-348-0072) OR Espaol: 1-855-Djelo-Ya (3-559-741-6384) o para ms informacin haga clic aqu or Text READY to 200-400 to register via text  James [name] - following are the goals we discussed in your visit today:   Goals Addressed             This Visit's Progress    Manage Fatigue (Tiredness-COPD)       Follow Up Date 09/25/20   - develop a new routine to improve sleep - get at least 7 to 8 hours of sleep at night - keep room cool and dark - practice relaxation or meditation daily    Why is this important?   Feeling tired or worn out is a common symptom of COPD (chronic obstructive pulmonary disease).  Learning when you feel your best and when you need rest is important.  Managing the tiredness (fatigue) will help you be active and enjoy life.          Stop or Cut Down Drinking Alcohol        Timeframe:  Long-Range Goal Priority:  High Start Date:   04/25/20                          Expected End Date:  10/29/20                     Follow Up Date 09/25/20   - avoid people and places where alcohol is used - develop a plan to avoid relapse  - think about counseling to assist in [place] - work with [name]/[name] for therapy options   Why is this important?   To stop drinking it is important to have support from a person or group of people who you can count on.  You will also need to think about the things that make you feel like using alcohol; then plan for how to handle them.         Track and Manage My Triggers-COPD       Timeframe:  Long-Range Goal Priority:  High Start Date:  04/09/20                           Expected End Date:   10/2620                    Follow Up Date 09/25/20  - take all prescribed medication as  directed-call for refills before you run out - attend next appointment with [name] on 9/26-take all of your medications with you to this appointment  - avoid outdoors during the warmest part of the day - avoid second hand smoke - identify and remove indoor air pollutants - limit outdoor activity during cold weather - listen for public air quality announcements every day  - call PCP with new concerns or questions    Why is this important?   Triggers are activities or things, like tobacco smoke or cold weather, that make your COPD (chronic obstructive pulmonary disease) flare-up.  Knowing these triggers helps you plan how to stay away from them.  When you cannot remove them, you can learn how to manage them.             Please see education materials related to diabetes provided as print materials.   The patient verbalized understanding of instructions provided today and agreed to receive a mailed copy of patient instruction and/or educational materials.  Telephone follow up appointment with Managed Medicaid care management team member  scheduled for:09/25/20 @ 11:30am  [name] RN, BSN [place] RN Care Coordinator   Following is a copy of your plan of care:  Care Plan : COPD (Adult)  Updates made by [name], RN since 09/15/2020 12:00 AM     Problem: Symptom Exacerbation (COPD)      Long-Range Goal: Symptom Exacerbation Prevented or Minimized   Start Date: 04/09/2020  Expected End Date: 10/29/2020  Recent Progress: On track  Priority: High  Note:   Current Barriers:  Knowledge deficits related to basic COPD self care/management-[name] was recently admitted for COPD exacerbation and pnuemonia. He reports taking his medications, wearing oxygen continuously, and has a follow up with his PCP on 04/14/20. He does report having difficulty at times swallowing food and liquid, pain in back and legs, and difficulty falling asleep. He is planning to discuss these issues with his PCP. [name] reports having transportation and plenty of food. He lives alone, and has support from his sister. [name] attended his follow up with PCP. He was working outside at his home today and reports doing "about the same". He reports increased difficulty breathing when it is hot outside. He has his medications and oxygen, reports no needs at this time. He is working with [facility] pharmacist for medication management.Update-[name] reports his breathing is about the same, worse with the warm temperatures. He needs two medications refilled and request RNCM to call him on Monday at 10am to assist in requesting refills. He is aware of his PCP appointment on 7/28 and has transportation.  He has not been drinking alcohol and reports feeling better since quitting.  [name] recently hospitalized for COPD exacerbation, discharged home 8/11.  He using 2L of oxygen continuous and resting. He reports improvement with breathing after using new inhaler Anoro. Update- [name] received the correct connector. He reports  improvement with breathing since using new inhaler trelegy. He took his last losartan tablet this morning and needs a refill. [name] reports not tolerating Metformin XR, having nausea and feeling bad. He prefers to take metformin twice daily. His food stamps have stopped and he is concerned about affording food. Case Manager Clinical Goal(s): patient will report using inhalers as prescribed including rinsing mouth after use patient will engage in lite exercise as tolerated to build/regain stamina and strength and reduce shortness of breath through activity  tolerance patient will verbalize basic understanding of COPD disease process and self care activities patient will not be hospitalized for COPD exacerbation as evidenced  Patient will work with [facility] pharmacist for medication management-Met Patient will attend all scheduled appointments Interventions:  Inter-disciplinary care team collaboration (see longitudinal plan of care)  Discussed upcoming appointment with [name]. Advised to take all medications to this appointment Encouraged to avoid being outside during the middle of the day when it is hot outside Advised to call pharmacy for any needed refills Provided RNCM contact information for any healthcare related barriers Collaborated with [name] for losartan refill(currently not on medication list) and metformin RNCM will follow closely Advised patient to cut back on soda, substituting diet or zero option Collaborated with Ubaldo Glassing, BSW for assistance reapplying for food benefits Provided therapeutic listening Patient Goals/Self-Care Activities:  - take all prescribed medication as directed-call for refills before you run out - attend next appointment with [name] on 9/26-take all of your medications with you to this appointment  - avoid outdoors during the warmest part of the day - avoid second hand smoke - identify and remove indoor air pollutants - limit outdoor activity  during cold weather - listen for public air quality announcements every day  - call PCP with new concerns or questions Follow Up Plan: Telephone follow up appointment with care management team member scheduled for:09/25/20 @ 11:30am     Care Plan : Wellness (Adult)  Updates made by [name], RN since 09/15/2020 12:00 AM     Problem: (RNCM) Alcohol Use (Wellness)      Long-Range Goal: Alcohol Use Managed   Start Date: 04/25/2020  Expected End Date: 09/25/2020  Recent Progress: On track  Priority: High  Note:   Current Barriers:  Ineffective Self Health Maintenance-Mr. Cosgriff was admitted on 4/16-4/21. Patient reports it was because of the alcohol. He wants to quit and feels that he can stop drinking on his own.  Update-Mr. Martindale reports feeling better and relates this to not drinking alcohol. He is very glad that he has stopped drinking Does not adhere to provider recommendations re: quit drinking alcohol Currently UNABLE TO independently self manage needs related to chronic health conditions.  Knowledge Deficits related to short term plan for care coordination needs and long term plans for chronic disease management needs Nurse Case Manager Clinical Goal(s):  patient will work with care management team to address care coordination and chronic disease management needs related to alcohol use   Interventions:  Evaluation of current treatment plan related to alcohol use and quitting and patient's adherence to plan as established by provider. Advised patient to avoid friends/family/situations that drinking is involved, consider counseling for quitting drinking, reach out to supportive friends/family Discussed plans with patient for ongoing care management follow up and provided patient with direct contact information for care management team Provided therapeutic listening Congratulated patient on his progress, provided encouragement Self Care Activities:  Patient will self administer  medications as prescribed Patient will attend all scheduled provider appointments Patient will call pharmacy for medication refills Patient will call provider office for new concerns or questions Patient Goals: - avoid people and places where alcohol is used - develop a plan to avoid relapse  - think about counseling to assist in [place] - work with Ubaldo Glassing for therapy options Follow Up Plan: Telephone follow up appointment with care management team member scheduled for:09/25/20 @ 11:30am

## 2020-09-15 NOTE — Telephone Encounter (Signed)
Call the pt and tell him his renal function is improved from the hospital,  I mistakenly stopped the losartan, he can continue losartan and I sent a refill in for him

## 2020-09-15 NOTE — Patient Outreach (Signed)
Medicaid Managed Care   Nurse Care Manager Note  09/15/2020 Name:  James [name] MRN:  [id number] DOB:  October 20, 1960  James [name] is an 60 y.o. year old male who is a primary patient of James Gaskins Burnett Harry, MD.  The [facility] Managed Care Coordination team was consulted for assistance with:    COPD DMII and alcohol use  [name] was given information about Medicaid Managed Care Coordination team services today. [name] Patient agreed to services and verbal consent obtained.  Engaged with patient by telephone for follow up visit in response to provider referral for case management and/or care coordination services.   Assessments/Interventions:  Review of past medical history, allergies, medications, health status, including review of consultants reports, laboratory and other test data, was performed as part of comprehensive evaluation and provision of chronic care management services.  SDOH (Social Determinants of Health) assessments and interventions performed: SDOH Interventions    Flowsheet Row Most Recent Value  SDOH Interventions   Food Insecurity Interventions Other (Comment)  [refer to BSW for assistance with applying for food benefits]       Care Plan  Allergies  Allergen Reactions   Gabapentin Other (See Comments)    hallucinations    Medications Reviewed Today     Reviewed by James Montane, RN (Registered Nurse) on 09/15/20 at 1053  Med List Status: <None>   Medication Order Taking? Sig Documenting Provider Last Dose Status Informant  albuterol (PROAIR HFA) 108 (90 Base) MCG/ACT inhaler [id number] Yes Inhale 2 puffs into the lungs every 4 (four) hours as needed. [name], MD Taking Active   albuterol (PROVENTIL) (2.5 MG/3ML) 0.083% nebulizer solution [id number] Yes TAKE 3 MLS (2.5 MG TOTAL) BY NEBULIZATION IN THE MORNING, AT NOON, AND AT BEDTIME. [name], MD Taking Active   Fluticasone-Umeclidin-[facility]ilant [facility] ELLIPTA) 200-62.5-25 MCG/INH  AEPB [id number] Yes Inhale 1 puff into the lungs daily. [name], MD Taking Active   furosemide (LASIX) 20 MG tablet [id number] Yes Take 1 tablet (20 mg total) by mouth daily as needed for edema. [name], MD Taking Active   guaiFENesin (MUCINEX) 600 MG 12 hr tablet [id number] Yes Take 1 tablet (600 mg total) by mouth 2 (two) times daily as needed for cough or to loosen phlegm. [name], MD Taking Active   metFORMIN (GLUCOPHAGE XR) 500 MG 24 hr tablet [id number] No Take 2 tablets (1,000 mg total) by mouth daily with breakfast.  Patient not taking: Reported on 09/15/2020   [name], MD Not Taking Active   metoprolol succinate (TOPROL-XL) 25 MG 24 hr tablet [id number] Yes Take 1 tablet (25 mg total) by mouth daily. [name], MD Taking Active   pantoprazole (PROTONIX) 40 MG tablet [id number] Yes Take 1 tablet (40 mg total) by mouth daily. [name], MD Taking Active   predniSONE (DELTASONE) 10 MG tablet [id number] Yes Take 1 tablet (10 mg total) by mouth daily with breakfast. [name], MD Taking Active   Respiratory Therapy Supplies (FLUTTER) DEVI [id number] Yes Use 4 times daily [name], MD Taking Active Self            Patient Active Problem List   Diagnosis Date Noted   Obesity (BMI 30.0-34.9) 04/19/2020   Bronchiectasis ([place]) 09/24/2019   Type 2 diabetes mellitus without complication, without long-term current use of insulin ([place]) 07/24/2019   Other atopic dermatitis 05/01/2019   Chronic respiratory failure with hypoxia ([place]) 04/23/2019  Intellectual disability 04/23/2019   History of alcohol use 11/23/2018   Hypertension 05/17/2016   History of tobacco abuse 04/06/2016   COPD mixed type ([place]) 03/26/2016    Conditions to be addressed/monitored per PCP order:  COPD and DMII  Care Plan : COPD (Adult)  Updates made by James Montane, RN since 09/15/2020 12:00 AM     Problem: Symptom Exacerbation (COPD)      Long-Range Goal:  Symptom Exacerbation Prevented or Minimized   Start Date: 04/09/2020  Expected End Date: 10/29/2020  Recent Progress: On track  Priority: High  Note:   Current Barriers:  Knowledge deficits related to basic COPD self care/management-[name] was recently admitted for COPD exacerbation and pnuemonia. He reports taking his medications, wearing oxygen continuously, and has a follow up with his PCP on 04/14/20. He does report having difficulty at times swallowing food and liquid, pain in back and legs, and difficulty falling asleep. He is planning to discuss these issues with his PCP. [name] reports having transportation and plenty of food. He lives alone, and has support from his sister. [name] attended his follow up with PCP. He was working outside at his home today and reports doing "about the same". He reports increased difficulty breathing when it is hot outside. He has his medications and oxygen, reports no needs at this time. He is working with [facility] pharmacist for medication management.Update-[name] reports his breathing is about the same, worse with the warm temperatures. He needs two medications refilled and request RNCM to call him on Monday at 10am to assist in requesting refills. He is aware of his PCP appointment on 7/28 and has transportation.  He has not been drinking alcohol and reports feeling better since quitting.  [name] recently hospitalized for COPD exacerbation, discharged home 8/11.  He using 2L of oxygen continuous and resting. He reports improvement with breathing after using new inhaler Anoro. Update- [name] received the correct connector. He reports improvement with breathing since using new inhaler trelegy. He took his last losartan tablet this morning and needs a refill. [name] reports not tolerating Metformin XR, having nausea and feeling bad. He prefers to take metformin twice daily. His food stamps have stopped and he is concerned about affording food. Case  Manager Clinical Goal(s): patient will report using inhalers as prescribed including rinsing mouth after use patient will engage in lite exercise as tolerated to build/regain stamina and strength and reduce shortness of breath through activity tolerance patient will verbalize basic understanding of COPD disease process and self care activities patient will not be hospitalized for COPD exacerbation as evidenced  Patient will work with [facility] pharmacist for medication management-Met Patient will attend all scheduled appointments Interventions:  Inter-disciplinary care team collaboration (see longitudinal plan of care)  Discussed upcoming appointment with [name]. Advised to take all medications to this appointment Encouraged to avoid being outside during the middle of the day when it is hot outside Advised to call pharmacy for any needed refills Provided RNCM contact information for any healthcare related barriers Collaborated with [name] for losartan refill(currently not on medication list) and metformin RNCM will follow closely Advised patient to cut back on soda, substituting diet or zero option Collaborated with Ubaldo Glassing, BSW for assistance reapplying for food benefits Provided therapeutic listening Patient Goals/Self-Care Activities:  - take all prescribed medication as directed-call for refills before you run out - attend next appointment with [name] on 9/26-take all of your medications with you  to this appointment  - avoid outdoors during the warmest part of the day - avoid second hand smoke - identify and remove indoor air pollutants - limit outdoor activity during cold weather - listen for public air quality announcements every day  - call PCP with new concerns or questions Follow Up Plan: Telephone follow up appointment with care management team member scheduled for:09/25/20 @ 11:30am     Care Plan : Wellness (Adult)  Updates made by James Montane, RN since 09/15/2020  12:00 AM     Problem: (RNCM) Alcohol Use (Wellness)      Long-Range Goal: Alcohol Use Managed   Start Date: 04/25/2020  Expected End Date: 09/25/2020  Recent Progress: On track  Priority: High  Note:   Current Barriers:  Ineffective Self Health Maintenance-Mr. Dobek was admitted on 4/16-4/21. Patient reports it was because of the alcohol. He wants to quit and feels that he can stop drinking on his own.  Update-Mr. Vittorio reports feeling better and relates this to not drinking alcohol. He is very glad that he has stopped drinking Does not adhere to provider recommendations re: quit drinking alcohol Currently UNABLE TO independently self manage needs related to chronic health conditions.  Knowledge Deficits related to short term plan for care coordination needs and long term plans for chronic disease management needs Nurse Case Manager Clinical Goal(s):  patient will work with care management team to address care coordination and chronic disease management needs related to alcohol use   Interventions:  Evaluation of current treatment plan related to alcohol use and quitting and patient's adherence to plan as established by provider. Advised patient to avoid friends/family/situations that drinking is involved, consider counseling for quitting drinking, reach out to supportive friends/family Discussed plans with patient for ongoing care management follow up and provided patient with direct contact information for care management team Provided therapeutic listening Congratulated patient on his progress, provided encouragement Self Care Activities:  Patient will self administer medications as prescribed Patient will attend all scheduled provider appointments Patient will call pharmacy for medication refills Patient will call provider office for new concerns or questions Patient Goals: - avoid people and places where alcohol is used - develop a plan to avoid relapse  - think about counseling  to assist in [place] - work with Ubaldo Glassing for therapy options Follow Up Plan: Telephone follow up appointment with care management team member scheduled for:09/25/20 @ 11:30am      Follow Up:  Patient agrees to Care Plan and Follow-up.  Plan: The Managed Medicaid care management team will reach out to the patient again over the next 15 days.  Date/time of next scheduled RN care management/care coordination outreach:  09/25/20 @ 11:30am  [name] RN, BSN [place]  [employer] manager

## 2020-09-16 ENCOUNTER — Other Ambulatory Visit: Payer: Medicaid Other | Admitting: *Deleted

## 2020-09-16 ENCOUNTER — Other Ambulatory Visit: Payer: Self-pay

## 2020-09-16 NOTE — Patient Instructions (Signed)
Visit Information  Mr. Hepburn was given information about Medicaid Managed Care team care coordination services as a part of their Healthy [facility] Medicaid benefit. [name] verbally consented to engagement with the [facility] Managed Care team.   If you are experiencing a medical emergency, please call 911 or report to your local emergency department or urgent care.   If you have a non-emergency medical problem during routine business hours, please contact your provider's office and ask to speak with a nurse.   For questions related to your Healthy [facility] health plan, please call: [phone] or visit the homepage here: [email or web address]  If you would like to schedule transportation through your Healthy [facility] plan, please call the following number at least 2 days in advance of your appointment: [phone]  Call the [place] at [phone], at any time, 24 hours a day, 7 days a week. If you are in danger or need immediate medical attention call 911.  If you would like help to quit smoking, call 1-800-QUIT-NOW 9034334588) OR Espaol: 1-855-Djelo-Ya (3-491-791-5056) o para ms informacin haga clic aqu or Text READY to 200-400 to register via text  Mr. Remmel - following are the goals we discussed in your visit today:   Goals Addressed   None     Please see education materials related to COPD provided by MyChart link.  Patient will access education via MyChart  Telephone follow up appointment with Managed Medicaid care management team member scheduled for:09/25/20 @ 11:30am  [name] RN, BSN [place] RN Care Coordinator   Following is a copy of your plan of care:  Care Plan : COPD (Adult)  Updates made by [name], RN since 09/16/2020 12:00 AM     Problem: Symptom Exacerbation (COPD)      Long-Range Goal: Symptom Exacerbation Prevented or Minimized    Start Date: 04/09/2020  Expected End Date: 10/29/2020  Recent Progress: On track  Priority: High  Note:   Current Barriers:  Knowledge deficits related to basic COPD self care/management-Mr. Stockley was recently admitted for COPD exacerbation and pnuemonia. He reports taking his medications, wearing oxygen continuously, and has a follow up with his PCP on 04/14/20. He does report having difficulty at times swallowing food and liquid, pain in back and legs, and difficulty falling asleep. He is planning to discuss these issues with his PCP. Mr. Brodersen reports having transportation and plenty of food. He lives alone, and has support from his sister. Mr. Wetherington attended his follow up with PCP. He was working outside at his home today and reports doing "about the same". He reports increased difficulty breathing when it is hot outside. He has his medications and oxygen, reports no needs at this time. He is working with [facility] pharmacist for medication management.Update-Mr. Nordquist reports his breathing is about the same, worse with the warm temperatures. He needs two medications refilled and request RNCM to call him on Monday at 10am to assist in requesting refills. He is aware of his PCP appointment on 7/28 and has transportation.  He has not been drinking alcohol and reports feeling better since quitting.  Mr. Kissick recently hospitalized for COPD exacerbation, discharged home 8/11.  He using 2L of oxygen continuous and resting. He reports improvement with breathing after using new inhaler Anoro. Update- Mr. Krise received the correct connector. He reports improvement with breathing since using new inhaler trelegy. He took his last losartan tablet this morning and needs a refill.  Mr. Aldape reports not tolerating Metformin XR, having nausea and feeling bad. He prefers to take metformin twice daily. His food stamps have stopped and he is concerned about affording food. Case Manager Clinical Goal(s): patient will report  using inhalers as prescribed including rinsing mouth after use patient will engage in lite exercise as tolerated to build/regain stamina and strength and reduce shortness of breath through activity tolerance patient will verbalize basic understanding of COPD disease process and self care activities patient will not be hospitalized for COPD exacerbation as evidenced  Patient will work with [facility] pharmacist for medication management-Met Patient will attend all scheduled appointments Interventions:  Inter-disciplinary care team collaboration (see longitudinal plan of care)  Discussed upcoming appointment with [name]. Advised to take all medications to this appointment Encouraged to avoid being outside during the middle of the day when it is hot outside Advised to call pharmacy for any needed refills Provided RNCM contact information for any healthcare related barriers Collaborated with [name] for losartan refill(currently not on medication list) and metformin-updated patient regarding new prescription for Losartan sent by [name] Advised patient to contact [name] re: concerns with metformin [phone] RNCM will follow closely Advised patient to cut back on soda, substituting diet or zero option Collaborated with Ubaldo Glassing, BSW for assistance reapplying for food benefits Provided therapeutic listening Patient Goals/Self-Care Activities:  - take all prescribed medication as directed-call for refills before you run out - attend next appointment with [name] on 9/26-take all of your medications with you to this appointment  - avoid outdoors during the warmest part of the day - avoid second hand smoke - identify and remove indoor air pollutants - limit outdoor activity during cold weather - listen for public air quality announcements every day  - call PCP with new concerns or questions Follow Up Plan: Telephone follow up appointment with care management team member scheduled  for:09/25/20 @ 11:30am

## 2020-09-16 NOTE — Telephone Encounter (Signed)
Patient aware of message per [name].  Verbalized understanding and will resume Losartan.   Has states the present dose of Metformin makes him sick on the stomach. He states he is eating before he takes this medication. He is unable to continue.   Please advise.

## 2020-09-16 NOTE — Telephone Encounter (Signed)
For now stop metformin

## 2020-09-16 NOTE — Telephone Encounter (Signed)
Pt called to return call. Attempted to get pt transferred to office with no response. Please advise.

## 2020-09-16 NOTE — Telephone Encounter (Signed)
Good Morning Ms. [name]  I tried contacting pt to make aware of [name] message in regards to the Losartan but unfortunately his vm is full.

## 2020-09-16 NOTE — Telephone Encounter (Signed)
Patient is aware to d/c the Metformin.  He has not taken in several days.  He does not know his blood sugar and is unable to check because he is not at home.   Advised patient to call office back with CBG readings.

## 2020-09-16 NOTE — Patient Outreach (Signed)
Medicaid Managed Care   Nurse Care Manager Note  09/16/2020 Name:  James [name] MRN:  [id number] DOB:  February 27, 1960  James [name] is an 60 y.o. year old male who is a primary patient of James Gaskins Burnett Harry, MD.  The [facility] Managed Care Coordination team was consulted for assistance with:    COPD DMII  James [name] was given information about Medicaid Managed Care Coordination team services today. James [name] Patient agreed to services and verbal consent obtained.  Engaged with patient by telephone for follow up visit in response to provider referral for case management and/or care coordination services.   Assessments/Interventions:  Review of past medical history, allergies, medications, health status, including review of consultants reports, laboratory and other test data, was performed as part of comprehensive evaluation and provision of chronic care management services.  James [name] (Social Determinants of Health) assessments and interventions performed:   Care Plan  Allergies  Allergen Reactions   Gabapentin Other (See Comments)    hallucinations    Medications Reviewed Today     Reviewed by James Montane, RN (Registered Nurse) on 09/15/20 at 1053  Med List Status: <None>   Medication Order Taking? Sig Documenting Provider Last Dose Status Informant  albuterol (PROAIR HFA) 108 (90 Base) MCG/ACT inhaler [id number] Yes Inhale 2 puffs into the lungs every 4 (four) hours as needed. James Stain, MD Taking Active   albuterol (PROVENTIL) (2.5 MG/3ML) 0.083% nebulizer solution [id number] Yes TAKE 3 MLS (2.5 MG TOTAL) BY NEBULIZATION IN THE MORNING, AT NOON, AND AT BEDTIME. James Stain, MD Taking Active   Fluticasone-Umeclidin-[facility] ELLIPTA) 200-62.5-25 MCG/INH AEPB [id number] Yes Inhale 1 puff into the lungs daily. James Stain, MD Taking Active   furosemide (LASIX) 20 MG tablet [id number] Yes Take 1 tablet (20 mg total) by mouth daily as needed for edema. James Stain, MD Taking Active   guaiFENesin (MUCINEX) 600 MG 12 hr tablet [id number] Yes Take 1 tablet (600 mg total) by mouth 2 (two) times daily as needed for cough or to loosen phlegm. James Bi, MD Taking Active   metFORMIN (GLUCOPHAGE XR) 500 MG 24 hr tablet [id number] No Take 2 tablets (1,000 mg total) by mouth daily with breakfast.  Patient not taking: Reported on 09/15/2020   James Stain, MD Not Taking Active   metoprolol succinate (TOPROL-XL) 25 MG 24 hr tablet [id number] Yes Take 1 tablet (25 mg total) by mouth daily. James Stain, MD Taking Active   pantoprazole (PROTONIX) 40 MG tablet [id number] Yes Take 1 tablet (40 mg total) by mouth daily. James Stain, MD Taking Active   predniSONE (DELTASONE) 10 MG tablet [id number] Yes Take 1 tablet (10 mg total) by mouth daily with breakfast. James Stain, MD Taking Active   Respiratory Therapy Supplies (FLUTTER) DEVI [id number] Yes Use 4 times daily James Stain, MD Taking Active Self            Patient Active Problem List   Diagnosis Date Noted   Obesity (BMI 30.0-34.9) 04/19/2020   Bronchiectasis ([place]) 09/24/2019   Type 2 diabetes mellitus without complication, without long-term current use of insulin ([place]) 07/24/2019   Other atopic dermatitis 05/01/2019   Chronic respiratory failure with hypoxia ([place]) 04/23/2019   Intellectual disability 04/23/2019   History of alcohol use 11/23/2018   Hypertension 05/17/2016   History of tobacco abuse 04/06/2016   COPD mixed type ([place]) 03/26/2016    Conditions to be addressed/monitored per PCP order:  COPD and DMII  Care Plan : COPD (Adult)  Updates made by James Montane, RN since 09/16/2020 12:00 AM     Problem: Symptom Exacerbation (COPD)      Long-Range Goal: Symptom Exacerbation Prevented or Minimized   Start Date: 04/09/2020  Expected End Date: 10/29/2020  Recent Progress: On track  Priority: High  Note:   Current Barriers:  Knowledge deficits related to basic  COPD self care/management-Mr. Cure was recently admitted for COPD exacerbation and pnuemonia. He reports taking his medications, wearing oxygen continuously, and has a follow up with his PCP on 04/14/20. He does report having difficulty at times swallowing food and liquid, pain in back and legs, and difficulty falling asleep. He is planning to discuss these issues with his PCP. [name] reports having transportation and plenty of food. He lives alone, and has support from his sister. [name] attended his follow up with PCP. He was working outside at his home today and reports doing "about the same". He reports increased difficulty breathing when it is hot outside. He has his medications and oxygen, reports no needs at this time. He is working with [facility] pharmacist for medication management.Update-[name] reports his breathing is about the same, worse with the warm temperatures. He needs two medications refilled and request RNCM to call him on Monday at 10am to assist in requesting refills. He is aware of his PCP appointment on 7/28 and has transportation.  He has not been drinking alcohol and reports feeling better since quitting.  [name] recently hospitalized for COPD exacerbation, discharged home 8/11.  He using 2L of oxygen continuous and resting. He reports improvement with breathing after using new inhaler Anoro. Update- [name] received the correct connector. He reports improvement with breathing since using new inhaler trelegy. He took his last losartan tablet this morning and needs a refill. [name] reports not tolerating Metformin XR, having nausea and feeling bad. He prefers to take metformin twice daily. His food stamps have stopped and he is concerned about affording food. Case Manager Clinical Goal(s): patient will report using inhalers as prescribed including rinsing mouth after use patient will engage in lite exercise as tolerated to build/regain stamina and strength and reduce  shortness of breath through activity tolerance patient will verbalize basic understanding of COPD disease process and self care activities patient will not be hospitalized for COPD exacerbation as evidenced  Patient will work with [facility] pharmacist for medication management-Met Patient will attend all scheduled appointments Interventions:  Inter-disciplinary care team collaboration (see longitudinal plan of care)  Discussed upcoming appointment with [name]. Advised to take all medications to this appointment Encouraged to avoid being outside during the middle of the day when it is hot outside Advised to call pharmacy for any needed refills Provided RNCM contact information for any healthcare related barriers Collaborated with [name] for losartan refill(currently not on medication list) and metformin-updated patient regarding new prescription for Losartan sent by [name] Advised patient to contact [name] re: concerns with metformin [phone] RNCM will follow closely Advised patient to cut back on soda, substituting diet or zero option Collaborated with Ubaldo Glassing, BSW for assistance reapplying for food benefits Provided therapeutic listening Patient Goals/Self-Care Activities:  - take all prescribed medication as directed-call for refills before you run out - attend next appointment with [name] on 9/26-take all of your medications with you to this appointment  - avoid outdoors during the warmest part of the day - avoid second hand  smoke - identify and remove indoor air pollutants - limit outdoor activity during cold weather - listen for public air quality announcements every day  - call PCP with new concerns or questions Follow Up Plan: Telephone follow up appointment with care management team member scheduled for:09/25/20 @ 11:30am      Follow Up:  Patient agrees to Care Plan and Follow-up.  Plan: The Managed Medicaid care management team will reach out to the patient  again over the next 15 days.  Date/time of next scheduled RN care management/care coordination outreach:  09/25/20@ 11:30am  [name] RN, [place] RN Care Coordinator

## 2020-09-16 NOTE — Addendum Note (Signed)
Addended by: Asencion Noble E on: 09/16/2020 12:18 PM   Modules accepted: Orders

## 2020-09-23 ENCOUNTER — Other Ambulatory Visit: Payer: Self-pay

## 2020-09-24 ENCOUNTER — Other Ambulatory Visit: Payer: Self-pay

## 2020-09-25 ENCOUNTER — Other Ambulatory Visit: Payer: Self-pay | Admitting: *Deleted

## 2020-09-25 NOTE — Patient Outreach (Signed)
Care Coordination  09/25/2020  James [name] Apr 25, 1960 [id number]   Medicaid Managed Care   Unsuccessful Outreach Note  09/25/2020 Name: James [name] MRN: [id number] DOB: February 05, 1960  Referred by: [name], MD Reason for referral : High Risk Managed Medicaid (Unsuccessful RNCM follow up outreach)   An unsuccessful telephone outreach was attempted today. The patient was referred to the case management team for assistance with care management and care coordination.   Follow Up Plan: The care management team will reach out to the patient again over the next 14 days.   [name] RN, BSN [place]  [employer] manager

## 2020-09-25 NOTE — Patient Instructions (Signed)
Visit Information  [name]  - as a part of your Medicaid benefit, you are eligible for care management and care coordination services at no cost or copay. I was unable to reach you by phone today but would be happy to help you with your health related needs. Please feel free to call me @ [phone].   A member of the Managed Medicaid care management team will reach out to you again over the next 14 days.   [name] RN, BSN [place]  [employer] manager

## 2020-09-26 ENCOUNTER — Other Ambulatory Visit: Payer: Self-pay

## 2020-09-26 ENCOUNTER — Other Ambulatory Visit: Payer: Self-pay | Admitting: *Deleted

## 2020-09-26 NOTE — Patient Instructions (Signed)
Visit Information  James [name] was given information about Medicaid Managed Care team care coordination services as a part of their Healthy [facility] Medicaid benefit. James [name] verbally consented to engagement with the [facility] Managed Care team.   If you are experiencing a medical emergency, please call 911 or report to your local emergency department or urgent care.   If you have a non-emergency medical problem during routine business hours, please contact your provider's office and ask to speak with a nurse.   For questions related to your Healthy [facility] health plan, please call: [phone] or visit the homepage here: [email or web address]  If you would like to schedule transportation through your Healthy [facility] plan, please call the following number at least 2 days in advance of your appointment: [phone]  Call the [facility] at [phone], at any time, 24 hours a day, 7 days a week. If you are in danger or need immediate medical attention call 911.  If you would like help to quit smoking, call 1-800-QUIT-NOW (864)148-8645) OR Espaol: 1-855-Djelo-Ya (7-628-315-1761) o para ms informacin haga clic aqu or Text READY to 200-400 to register via text  James [name] - following are the goals we discussed in your visit today:   Goals Addressed             This Visit's Progress    Manage Fatigue (Tiredness-COPD)       Follow Up Date 09/29/20   - develop a new routine to improve sleep - get at least 7 to 8 hours of sleep at night - keep room cool and dark - practice relaxation or meditation daily    Why is this important?   Feeling tired or worn out is a common symptom of COPD (chronic obstructive pulmonary disease).  Learning when you feel your best and when you need rest is important.  Managing the tiredness (fatigue) will help you be active and enjoy life.          Stop or Cut Down Drinking Alcohol        Timeframe:  Long-Range Goal Priority:  High Start Date:   04/25/20                          Expected End Date:  10/29/20                     Follow Up Date 09/29/20   - avoid people and places where alcohol is used - develop a plan to avoid relapse  - think about counseling to assist in [place] - work with [name]/[name] for therapy options   Why is this important?   To stop drinking it is important to have support from a person or group of people who you can count on.  You will also need to think about the things that make you feel like using alcohol; then plan for how to handle them.         Track and Manage My Triggers-COPD       Timeframe:  Long-Range Goal Priority:  High Start Date:  04/09/20                           Expected End Date:   10/2620                    Follow Up Date 09/29/20  - take all prescribed medication as  directed-call for refills before you run out - attend next appointment with [name] on 9/26-take all of your medications with you to this appointment  - avoid outdoors during the warmest part of the day - avoid second hand smoke - identify and remove indoor air pollutants - limit outdoor activity during cold weather - listen for public air quality announcements every day  - call PCP with new concerns or questions    Why is this important?   Triggers are activities or things, like tobacco smoke or cold weather, that make your COPD (chronic obstructive pulmonary disease) flare-up.  Knowing these triggers helps you plan how to stay away from them.  When you cannot remove them, you can learn how to manage them.                Telephone follow up appointment with Managed Medicaid care management team member scheduled for:09/29/20 @ 2pm  [name] RN, BSN [place] RN Care Coordinator   Following is a copy of your plan of care:  Care Plan : COPD (Adult)  Updates made by [name], RN since 09/26/2020  12:00 AM     Problem: Symptom Exacerbation (COPD)      Long-Range Goal: Symptom Exacerbation Prevented or Minimized   Start Date: 04/09/2020  Expected End Date: 10/29/2020  Recent Progress: On track  Priority: High  Note:   Current Barriers:  Knowledge deficits related to basic COPD self care/management-James [name] was recently admitted for COPD exacerbation and pnuemonia. He reports taking his medications, wearing oxygen continuously, and has a follow up with his PCP on 04/14/20. He does report having difficulty at times swallowing food and liquid, pain in back and legs, and difficulty falling asleep. He is planning to discuss these issues with his PCP. James [name] reports having transportation and plenty of food. He lives alone, and has support from his sister. James [name] attended his follow up with PCP. He was working outside at his home today and reports doing "about the same". He reports increased difficulty breathing when it is hot outside. He has his medications and oxygen, reports no needs at this time. He is working with [facility] pharmacist for medication management.Update-James [name] reports his breathing is about the same, worse with the warm temperatures. He needs two medications refilled and request RNCM to call him on Monday at 10am to assist in requesting refills. He is aware of his PCP appointment on 7/28 and has transportation.  He has not been drinking alcohol and reports feeling better since quitting.  James [name] recently hospitalized for COPD exacerbation, discharged home 8/11.  He using 2L of oxygen continuous and resting. He reports improvement with breathing after using new inhaler Anoro. James [name] received the correct connector. He reports improvement with breathing since using new inhaler trelegy. He took his last losartan tablet this morning and needs a refill. James [name] reports not tolerating Metformin XR, having nausea and feeling bad. He prefers to take metformin twice daily. His food  stamps have stopped and he is concerned about affording food.-James [name] is aware of PCP appointment 9/26. He is not taking metformin and will discuss with [name]. He has transportation to this appointment. He continues to have concern regarding not receiving food stamps. Case Manager Clinical Goal(s): patient will report using inhalers as prescribed including rinsing mouth after use patient will engage in lite exercise as tolerated to build/regain stamina and strength and reduce shortness of breath through  activity tolerance patient will verbalize basic understanding of COPD disease process and self care activities patient will not be hospitalized for COPD exacerbation as evidenced  Patient will work with [facility] pharmacist for medication management-Met Patient will attend all scheduled appointments Interventions:  Inter-disciplinary care team collaboration (see longitudinal plan of care)  Discussed upcoming appointment with [name]. Advised to take all medications to this appointment Encouraged to avoid being outside during the middle of the day when it is hot outside Advised to call pharmacy for any needed refills Provided RNCM contact information for any healthcare related barriers Collaborated with [name] for losartan refill(currently not on medication list) and metformin-updated patient regarding new prescription for Losartan sent by [name] Advised patient to contact [name] re: concerns with metformin [phone] RNCM will follow closely Advised patient to cut back on soda, substituting diet or zero option Collaborated with Ubaldo Glassing, BSW for assistance reapplying for food benefits Provided therapeutic listening Patient Goals/Self-Care Activities:  - take all prescribed medication as directed-call for refills before you run out - attend next appointment with [name] on 9/26-take all of your medications with you to this appointment  - avoid outdoors during the warmest part  of the day - avoid second hand smoke - identify and remove indoor air pollutants - limit outdoor activity during cold weather - listen for public air quality announcements every day  - call PCP with new concerns or questions Follow Up Plan: Telephone follow up appointment with care management team member scheduled for:09/29/20 @ 2:00pm

## 2020-09-26 NOTE — Patient Outreach (Signed)
Medicaid Managed Care   Nurse Care Manager Note  09/26/2020 Name:  James [name] MRN:  [id number] DOB:  1960-12-15  James [name] is an 60 y.o. year old male who is James [name] primary patient of James Gaskins Burnett Harry, MD.  The [facility] Managed Care Coordination team was consulted for assistance with:    HTN COPD James [name]  [name] was given information about Medicaid Managed Care Coordination team services today. James [name] Patient agreed to services and verbal consent obtained.  Engaged with patient by telephone for follow up visit in response to provider referral for case management and/or care coordination services.   Assessments/Interventions:  Review of past medical history, allergies, medications, health status, including review of consultants reports, laboratory and other test data, was performed as part of comprehensive evaluation and provision of chronic care management services.  SDOH (Social Determinants of Health) assessments and interventions performed: SDOH Interventions    Flowsheet Row Most Recent Value  SDOH Interventions   Housing Interventions Intervention Not Indicated       Care Plan  Allergies  Allergen Reactions   Gabapentin Other (See Comments)    hallucinations    Medications Reviewed Today     Reviewed by James Montane, James [name] (Registered Nurse) on 09/26/20 at 1127  Med List Status: <None>   Medication Order Taking? Sig Documenting Provider Last Dose Status Informant  albuterol (PROAIR HFA) 108 (90 Base) MCG/ACT inhaler [id number] No Inhale 2 puffs into the lungs every 4 (four) hours as needed. James Stain, MD Taking Active   albuterol (PROVENTIL) (2.5 MG/3ML) 0.083% nebulizer solution [id number] No TAKE 3 MLS (2.5 MG TOTAL) BY NEBULIZATION IN THE MORNING, AT NOON, AND AT BEDTIME. James Stain, MD Taking Active   Fluticasone-Umeclidin-[facility] ELLIPTA) 200-62.5-25 MCG/INH AEPB [id number] No Inhale 1 puff into the lungs daily. James Stain,  MD Taking Active   furosemide (LASIX) 20 MG tablet [id number] No Take 1 tablet (20 mg total) by mouth daily as needed for edema. James Stain, MD Taking Active   guaiFENesin (MUCINEX) 600 MG 12 hr tablet [id number] No Take 1 tablet (600 mg total) by mouth 2 (two) times daily as needed for cough or to loosen phlegm. James Bi, MD Taking Active   losartan (COZAAR) 50 MG tablet [id number]  Take 1 tablet (50 mg total) by mouth daily. James Stain, MD  Active   metoprolol succinate (TOPROL-XL) 25 MG 24 hr tablet [id number] No Take 1 tablet (25 mg total) by mouth daily. James Stain, MD Taking Active   pantoprazole (PROTONIX) 40 MG tablet [id number] No Take 1 tablet (40 mg total) by mouth daily. James Stain, MD Taking Active   predniSONE (DELTASONE) 10 MG tablet [id number] No Take 1 tablet (10 mg total) by mouth daily with breakfast. James Stain, MD Taking Active   Respiratory Therapy Supplies (FLUTTER) DEVI [id number] No Use 4 times daily James Stain, MD Taking Active Self            Patient Active Problem List   Diagnosis Date Noted   Obesity (BMI 30.0-34.9) 04/19/2020   Bronchiectasis ([place]) 09/24/2019   Type 2 diabetes mellitus without complication, without long-term current use of insulin ([place]) 07/24/2019   Other atopic dermatitis 05/01/2019   Chronic respiratory failure with hypoxia ([place]) 04/23/2019   Intellectual disability 04/23/2019   History of alcohol use 11/23/2018   Hypertension 05/17/2016   History of tobacco abuse 04/06/2016   COPD mixed type ([place]) 03/26/2016  Conditions to be addressed/monitored per PCP order:  HTN, COPD, and James [name]  Care Plan : COPD (Adult)  Updates made by ,  James [name], James [name] since 09/26/2020 12:00 AM     Problem: Symptom Exacerbation (COPD)      Long-Range Goal: Symptom Exacerbation Prevented or Minimized   Start Date: 04/09/2020  Expected End Date: 10/29/2020  Recent Progress: On track  Priority: High  Note:   Current  Barriers:  Knowledge deficits related to basic COPD self care/management-James [name] was recently admitted for COPD exacerbation and pnuemonia. He reports taking his medications, wearing oxygen continuously, and has James [name] follow up with his PCP on 04/14/20. He does report having difficulty at times swallowing food and liquid, pain in back and legs, and difficulty falling asleep. He is planning to discuss these issues with his PCP. James [name] reports having transportation and plenty of food. He lives alone, and has support from his sister. James [name] attended his follow up with PCP. He was working outside at his home today and reports doing "about the same". He reports increased difficulty breathing when it is hot outside. He has his medications and oxygen, reports no needs at this time. He is working with [facility] pharmacist for medication management.Update-James [name] reports his breathing is about the same, worse with the warm temperatures. He needs two medications refilled and request RNCM to call him on Monday at 10am to assist in requesting refills. He is aware of his PCP appointment on 7/28 and has transportation.  He has not been drinking alcohol and reports feeling better since quitting.  James [name] recently hospitalized for COPD exacerbation, discharged home 8/11.  He using 2L of oxygen continuous and resting. He reports improvement with breathing after using new inhaler Anoro. James [name] received the correct connector. He reports improvement with breathing since using new inhaler trelegy. He took his last losartan tablet this morning and needs James [name] refill. James [name] reports not tolerating Metformin XR, having nausea and feeling bad. He prefers to take metformin twice daily. His food stamps have stopped and he is concerned about affording food.-James [name] is aware of PCP appointment 9/26. He is not taking metformin and will discuss with James [name]. He has transportation to this appointment. He continues to have concern  regarding not receiving food stamps. Case Manager Clinical Goal(s): patient will report using inhalers as prescribed including rinsing mouth after use patient will engage in lite exercise as tolerated to build/regain stamina and strength and reduce shortness of breath through activity tolerance patient will verbalize basic understanding of COPD disease process and self care activities patient will not be hospitalized for COPD exacerbation as evidenced  Patient will work with [facility] pharmacist for medication management-Met Patient will attend all scheduled appointments Interventions:  Inter-disciplinary care team collaboration (see longitudinal plan of care)  Discussed upcoming appointment with James [name]. Advised to take all medications to this appointment Encouraged to avoid being outside during the middle of the day when it is hot outside Advised to call pharmacy for any needed refills Provided RNCM contact information for any healthcare related barriers Collaborated with James [name] for losartan refill(currently not on medication list) and metformin-updated patient regarding new prescription for Losartan sent by James [name] Advised patient to contact James [name] re: concerns with metformin [phone] RNCM will follow closely Advised patient to cut back on soda, substituting diet or zero option Collaborated with [name], BSW for assistance reapplying for food benefits Provided therapeutic listening Patient Goals/Self-Care Activities:  - take all prescribed   medication as directed-call for refills before you run out - attend next appointment with James [name] on 9/26-take all of your medications with you to this appointment  - avoid outdoors during the warmest part of the day - avoid second hand smoke - identify and remove indoor air pollutants - limit outdoor activity during cold weather - listen for public air quality announcements every day  - call PCP with new concerns or questions Follow Up  Plan: Telephone follow up appointment with care management team member scheduled for:09/29/20 @ 2:00pm      Follow Up:  Patient agrees to Care Plan and Follow-up.  Plan: The Managed Medicaid care management team will reach out to the patient again over the next 7 days.  Date/time of next scheduled James [name] care management/care coordination outreach:  09/29/20 @ 2pm    James [name], BSN [place]  [facility] James [name] Care Coordinator  

## 2020-09-28 NOTE — Progress Notes (Signed)
Established Patient Office Visit  Subjective:  Patient ID: James [name], male    DOB: [birth year]  Age: 59 y.o. MRN: [id number]  CC:  Chief Complaint  Patient presents with   Hospitalization Follow-up    HPI James [name] presents for primary care follow-up.  The patient was just seen end of August for post hospital stay.  On arrival blood sugar 118.  He had to stop the metformin due to side effects.  He is no longer drinking alcohol and this is improved his glycemic control.  He has a slight dry cough but overall his breathing is better on the Trelegy.  His oxygen concentrator is not been working on a regular basis.    Past Medical History:  Diagnosis Date   Acute on chronic respiratory failure with hypoxia ([place]) 06/08/2013   CAP (community acquired pneumonia) 05/17/2018   See admit 05/17/18 ? rml  with   covid pcr neg - rx augmentin > f/u cxr in 4-6 weeks is fine unless condition declines    Community acquired pneumonia of right lower lobe of lung 05/17/2018   See admit 05/17/18 ? 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   Tick bite 06/03/2018    Past Surgical History:  Procedure Laterality Date   VIDEO ASSISTED THORACOSCOPY (VATS)/THOROCOTOMY Left 06/11/2013   Procedure: VIDEO ASSISTED THORACOSCOPY (VATS)/THOROCOTOMY;  Surgeon: Ivin Poot, MD;  Location: [place];  Service: Thoracic;  Laterality: Left;   VIDEO BRONCHOSCOPY N/A 06/11/2013   Procedure: VIDEO BRONCHOSCOPY;  Surgeon: Ivin Poot, MD;  Location: [place];  Service: Thoracic;  Laterality: N/A;   VIDEO BRONCHOSCOPY N/A 08/10/2018   Procedure: VIDEO BRONCHOSCOPY WITH FLUORO;  Surgeon: Candee Furbish, MD;   Location[facility] ENDOSCOPY;  Service: Endoscopy;  Laterality: N/A;    Family History  Problem Relation Age of Onset   Heart disease Father    COPD Sister     Social History   Socioeconomic History   Marital status: Divorced    Spouse name: Not on file   Number of children: Not on file   Years of education: Not on file   Highest education level: Not on file  Occupational History   Occupation: Dealer   Occupation: Curator  Tobacco Use   Smoking status: Former    Packs/day: 1.50    Years: 40.00    Pack years: 60.00    Types: Cigarettes    Quit date: 2014    Years since quitting: 8.7   Smokeless tobacco: Former    Types: Nurse, children's Use: Never used  Substance and Sexual Activity   Alcohol use: Yes    Alcohol/week: 28.0 standard drinks    Types: 28 Cans of beer per week    Comment: 4 beers a night; + jitters with not drinking, denies DTs or seizures   Drug use: Yes    Types: Marijuana    Comment: daily; quit using crack and methamphetamine about 5 months ago    Sexual activity: Not Currently    Partners: Female  Other Topics Concern  Not on file  Social History Narrative   Lives alone near [place] Determinants of Health   Financial Resource Strain: Not on file  Food Insecurity: Food Insecurity Present   Worried About Charity fundraiser in the Last Year: Often true   Arboriculturist in the Last Year: Never true  Transportation Needs: No Transportation Needs   Lack of Transportation (Medical): No   Lack of Transportation (Non-Medical): No  Physical Activity: Not on file  Stress: Not on file  Social Connections: Not on file  Intimate Partner Violence: Not on file    Outpatient Medications Prior to Visit  Medication Sig Dispense Refill   albuterol (PROAIR HFA) 108 (90 Base) MCG/ACT inhaler Inhale 2 puffs into the lungs every 4 (four) hours as needed. 8.5 g 0   albuterol (PROVENTIL) (2.5 MG/3ML) 0.083% nebulizer solution TAKE 3 MLS (2.5  MG TOTAL) BY NEBULIZATION IN THE MORNING, AT NOON, AND AT BEDTIME. 270 mL 1   Fluticasone-Umeclidin-Vilant (TRELEGY ELLIPTA) 200-62.5-25 MCG/INH AEPB Inhale 1 puff into the lungs daily. 60 each 3   furosemide (LASIX) 20 MG tablet Take 1 tablet (20 mg total) by mouth daily as needed for edema. 30 tablet 2   guaiFENesin (MUCINEX) 600 MG 12 hr tablet Take 1 tablet (600 mg total) by mouth 2 (two) times daily as needed for cough or to loosen phlegm.     losartan (COZAAR) 50 MG tablet Take 1 tablet (50 mg total) by mouth daily. 90 tablet 1   metoprolol succinate (TOPROL-XL) 25 MG 24 hr tablet Take 1 tablet (25 mg total) by mouth daily. 90 tablet 3   pantoprazole (PROTONIX) 40 MG tablet Take 1 tablet (40 mg total) by mouth daily. 60 tablet 6   predniSONE (DELTASONE) 10 MG tablet Take 1 tablet (10 mg total) by mouth daily with breakfast. 60 tablet 2   Respiratory Therapy Supplies (FLUTTER) DEVI Use 4 times daily 1 each 0   No facility-administered medications prior to visit.    Allergies  Allergen Reactions   Gabapentin Other (See Comments)    hallucinations    ROS Review of Systems  Constitutional: Negative.   HENT: Negative.  Negative for ear pain, postnasal drip, rhinorrhea, sinus pressure, sore throat, trouble swallowing and voice change.   Eyes: Negative.   Respiratory:  Positive for cough, shortness of breath and wheezing. Negative for apnea, choking, chest tightness and stridor.   Cardiovascular: Negative.  Negative for chest pain, palpitations and leg swelling.  Gastrointestinal: Negative.  Negative for abdominal distention, abdominal pain, nausea and vomiting.  Genitourinary: Negative.   Musculoskeletal: Negative.  Negative for arthralgias and myalgias.  Skin: Negative.  Negative for rash.  Allergic/Immunologic: Negative.  Negative for environmental allergies and food allergies.  Neurological: Negative.  Negative for dizziness, syncope, weakness and headaches.  Hematological:  Negative.  Negative for adenopathy. Does not bruise/bleed easily.  Psychiatric/Behavioral: Negative.  Negative for agitation and sleep disturbance. The patient is not nervous/anxious.      Objective:    Physical Exam Vitals reviewed.  Constitutional:      Appearance: Normal appearance. He is well-developed. He is not diaphoretic.  HENT:     Head: Normocephalic and atraumatic.     Nose: No nasal deformity, septal deviation, mucosal edema or rhinorrhea.     Right Sinus: No maxillary sinus tenderness or frontal sinus tenderness.     Left Sinus: No maxillary sinus tenderness or frontal sinus tenderness.     Mouth/Throat:  Pharynx: No oropharyngeal exudate.  Eyes:     General: No scleral icterus.    Conjunctiva/sclera: Conjunctivae normal.     Pupils: Pupils are equal, round, and reactive to light.  Neck:     Thyroid: No thyromegaly.     Vascular: No carotid bruit or JVD.     Trachea: Trachea normal. No tracheal tenderness or tracheal deviation.  Cardiovascular:     Rate and Rhythm: Normal rate and regular rhythm.     Chest Wall: PMI is not displaced.     Pulses: Normal pulses. No decreased pulses.     Heart sounds: Normal heart sounds, S1 normal and S2 normal. Heart sounds not distant. No murmur heard. No systolic murmur is present.  No diastolic murmur is present.    No friction rub. No gallop. No S3 or S4 sounds.  Pulmonary:     Effort: Pulmonary effort is normal. No tachypnea, accessory muscle usage or respiratory distress.     Breath sounds: No stridor. No decreased breath sounds, wheezing, rhonchi or rales.     Comments: Distant breath sounds improved airflow from before Chest:     Chest wall: No tenderness.  Abdominal:     General: Bowel sounds are normal. There is no distension.     Palpations: Abdomen is soft. Abdomen is not rigid.     Tenderness: There is no abdominal tenderness. There is no guarding or rebound.  Musculoskeletal:        General: Normal range of  motion.     Cervical back: Normal range of motion and neck supple. No edema, erythema or rigidity. No muscular tenderness. Normal range of motion.  Lymphadenopathy:     Head:     Right side of head: No submental or submandibular adenopathy.     Left side of head: No submental or submandibular adenopathy.     Cervical: No cervical adenopathy.  Skin:    General: Skin is warm and dry.     Coloration: Skin is not pale.     Findings: No rash.     Nails: There is no clubbing.  Neurological:     Mental Status: He is alert and oriented to person, place, and time.     Sensory: No sensory deficit.  Psychiatric:        Speech: Speech normal.        Behavior: Behavior normal.    BP 112/73   Pulse 76   Resp 16   Wt 199 lb 12.8 oz (90.6 kg)   SpO2 97%   BMI 29.51 kg/m  Wt Readings from Last 3 Encounters:  09/29/20 199 lb 12.8 oz (90.6 kg)  09/03/20 202 lb 3.2 oz (91.7 kg)  06/26/20 204 lb (92.5 kg)     There are no preventive care reminders to display for this patient.   There are no preventive care reminders to display for this patient.  Lab Results  Component Value Date   TSH 0.325 (L) 04/19/2020   Lab Results  Component Value Date   WBC 7.4 09/03/2020   HGB 13.0 09/03/2020   HCT 40.6 09/03/2020   MCV 89 09/03/2020   PLT 320 09/03/2020   Lab Results  Component Value Date   NA 143 09/03/2020   K 4.3 09/03/2020   CO2 28 09/03/2020   GLUCOSE 211 (H) 09/03/2020   BUN 15 09/03/2020   CREATININE 0.99 09/03/2020   BILITOT 0.2 09/03/2020   ALKPHOS 62 09/03/2020   AST 18 09/03/2020   ALT 17  09/03/2020   PROT 5.9 (L) 09/03/2020   ALBUMIN 4.4 09/03/2020   CALCIUM 9.9 09/03/2020   ANIONGAP 5 08/13/2020   EGFR 88 09/03/2020   No results found for: CHOL No results found for: HDL No results found for: [facility] Lab Results  Component Value Date   TRIG 95 10/21/2019   No results found for: CHOLHDL Lab Results  Component Value Date   HGBA1C 6.5 (H) 06/26/2020       Assessment & Plan:   Problem List Items Addressed This Visit       Cardiovascular and Mediastinum   Hypertension - Primary (Chronic)    Blood pressure well controlled at this time no change in medication will continue losartan 50 mg daily  Noted at last visit renal function was normal potassium normal on this dose of losartan        Respiratory   COPD mixed type (HCC)    Mixed COPD stable at this time continue Trelegy      Chronic respiratory failure with hypoxia (HCC)    Oxygen saturation on room air actually quite good 96% the patient concentrator is not functioning well [facility] brought the patient a new concentrator at this visit and changed out from his old unit        Endocrine   Type 2 diabetes mellitus without complication, without long-term current use of insulin (HCC)    A1c less than 7 off metformin we will monitor for now off medications      Relevant Orders   POCT glucose (manual entry)     Nervous and Auditory   Neuropathy    Associated with alcohol use plan to resume thiamine 100 mg daily and trial of duloxetine 60 mg daily        Other   History of alcohol use    Patient states he is no longer drinking alcohol       Meds ordered this encounter  Medications   DULoxetine (CYMBALTA) 60 MG capsule    Sig: Take 1 capsule (60 mg total) by mouth daily.    Dispense:  60 capsule    Refill:  3   thiamine 100 MG tablet    Sig: Take 1 tablet (100 mg total) by mouth daily.    Dispense:  60 tablet    Refill:  4    Follow-up: Return in about 3 months (around 12/29/2020).    Asencion Noble, MD

## 2020-09-29 ENCOUNTER — Other Ambulatory Visit: Payer: Self-pay

## 2020-09-29 ENCOUNTER — Ambulatory Visit: Payer: Medicaid Other | Attending: Critical Care Medicine | Admitting: Critical Care Medicine

## 2020-09-29 ENCOUNTER — Encounter: Payer: Self-pay | Admitting: Critical Care Medicine

## 2020-09-29 ENCOUNTER — Telehealth: Payer: Self-pay

## 2020-09-29 ENCOUNTER — Other Ambulatory Visit: Payer: Self-pay | Admitting: *Deleted

## 2020-09-29 VITALS — BP 112/73 | HR 76 | Resp 16 | Wt 199.8 lb

## 2020-09-29 DIAGNOSIS — J449 Chronic obstructive pulmonary disease, unspecified: Secondary | ICD-10-CM | POA: Diagnosis not present

## 2020-09-29 DIAGNOSIS — G629 Polyneuropathy, unspecified: Secondary | ICD-10-CM | POA: Insufficient documentation

## 2020-09-29 DIAGNOSIS — Z87898 Personal history of other specified conditions: Secondary | ICD-10-CM

## 2020-09-29 DIAGNOSIS — I10 Essential (primary) hypertension: Secondary | ICD-10-CM | POA: Diagnosis not present

## 2020-09-29 DIAGNOSIS — J9611 Chronic respiratory failure with hypoxia: Secondary | ICD-10-CM

## 2020-09-29 DIAGNOSIS — E119 Type 2 diabetes mellitus without complications: Secondary | ICD-10-CM

## 2020-09-29 LAB — GLUCOSE, POCT (MANUAL RESULT ENTRY): POC Glucose: 118 mg/dl — AB (ref 70–99)

## 2020-09-29 MED ORDER — DULOXETINE HCL 60 MG PO CPEP
60.0000 mg | ORAL_CAPSULE | Freq: Every day | ORAL | 3 refills | Status: DC
  Filled 2020-09-29: qty 60, 60d supply, fill #0
  Filled 2020-11-24: qty 60, 60d supply, fill #1

## 2020-09-29 MED ORDER — THIAMINE HCL 100 MG PO TABS
100.0000 mg | ORAL_TABLET | Freq: Every day | ORAL | 4 refills | Status: DC
  Filled 2020-09-29: qty 60, 60d supply, fill #0

## 2020-09-29 NOTE — Patient Instructions (Signed)
Visit Information  James [name] was given information about Medicaid Managed Care team care coordination services as a part of their Healthy [facility] Medicaid benefit. James [name] verbally consented to engagement with the [facility] Managed Care team.   If you are experiencing a medical emergency, please call 911 or report to your local emergency department or urgent care.   If you have a non-emergency medical problem during routine business hours, please contact your provider's office and ask to speak with a nurse.   For questions related to your Healthy [facility] health plan, please call: [phone] or visit the homepage here: [email or web address]  If you would like to schedule transportation through your Healthy [facility] plan, please call the following number at least 2 days in advance of your appointment: [phone]  Call the [place] at [phone], at any time, 24 hours a day, 7 days a week. If you are in danger or need immediate medical attention call 911.  If you would like help to quit smoking, call 1-800-QUIT-NOW 417-536-4974) OR Espaol: 1-855-Djelo-Ya (5-681-275-1700) o para ms informacin haga clic aqu or Text READY to 200-400 to register via text  James [name] - following are the goals we discussed in your visit today:   Goals Addressed             This Visit's Progress    Manage Fatigue (Tiredness-COPD)       Follow Up Date 10/29/20   - develop a new routine to improve sleep - get at least 7 to 8 hours of sleep at night - keep room cool and dark - practice relaxation or meditation daily    Why is this important?   Feeling tired or worn out is a common symptom of COPD (chronic obstructive pulmonary disease).  Learning when you feel your best and when you need rest is important.  Managing the tiredness (fatigue) will help you be active and enjoy life.          COMPLETED: Stop or Cut Down Drinking  Alcohol       Timeframe:  Long-Range Goal Priority:  High Start Date:   04/25/20                          Expected End Date:  10/29/20                     Follow Up Date 09/29/20   - avoid people and places where alcohol is used - develop a plan to avoid relapse  - think about counseling to assist in [place] - work with James [name]/James [name] for therapy options   Why is this important?   To stop drinking it is important to have support from a person or group of people who you can count on.  You will also need to think about the things that make you feel like using alcohol; then plan for how to handle them.         Track and Manage My Triggers-COPD       Timeframe:  Long-Range Goal Priority:  High Start Date:  04/09/20                           Expected End Date:   10/2620                    Follow Up Date 10/29/20  - take all prescribed medication  as directed-call for refills before you run out - attend next appointment with [name] on 12/19-take all of your medications with you to this appointment  - avoid outdoors during the warmest part of the day - avoid second hand smoke - identify and remove indoor air pollutants - limit outdoor activity during cold weather - listen for public air quality announcements every day  - call PCP with new concerns or questions    Why is this important?   Triggers are activities or things, like tobacco smoke or cold weather, that make your COPD (chronic obstructive pulmonary disease) flare-up.  Knowing these triggers helps you plan how to stay away from them.  When you cannot remove them, you can learn how to manage them.             Please see education materials related to diabetic diet provided as print materials.   The patient verbalized understanding of instructions provided today and agreed to receive a mailed copy of patient instruction and/or educational materials.  Telephone follow up appointment with Managed Medicaid care  management team member scheduled for:10/29/20 @ 10:30am  James Joiner RN, BSN [place] RN Care Coordinator   Following is a copy of your plan of care:  Care Plan : COPD (Adult)  Updates made by James Montane, RN since 09/29/2020 12:00 AM     Problem: Symptom Exacerbation (COPD)      Long-Range Goal: Symptom Exacerbation Prevented or Minimized   Start Date: 04/09/2020  Expected End Date: 10/29/2020  Recent Progress: On track  Priority: High  Note:   Current Barriers:  Knowledge deficits related to basic COPD self care/management-James [name] was recently admitted for COPD exacerbation and pnuemonia. He reports taking his medications, wearing oxygen continuously, and has a follow up with his PCP on 04/14/20. He does report having difficulty at times swallowing food and liquid, pain in back and legs, and difficulty falling asleep. He is planning to discuss these issues with his PCP. James [name] reports having transportation and plenty of food. He lives alone, and has support from his sister. James [name] attended his follow up with PCP. He was working outside at his home today and reports doing "about the same". He reports increased difficulty breathing when it is hot outside. He has his medications and oxygen, reports no needs at this time. He is working with [facility] pharmacist for medication management. He has not been drinking alcohol and reports feeling better since quitting.  James [name] recently hospitalized for COPD exacerbation, discharged home 8/11.  He using 2L of oxygen continuous and resting. He reports improvement with breathing after using new inhaler Anoro. James [name] received the correct connector. He reports improvement with breathing since using new inhaler trelegy. James [name] reports not tolerating Metformin XR, having nausea and feeling bad. He prefers to take metformin twice daily. His food stamps have stopped and he is concerned about affording food. [name] is aware of PCP appointment 9/26. He is not taking metformin and will discuss with [name]. He has transportation to this appointment. He continues to have concern regarding not receiving food stamps.-Update-James [name] attended PCP appointment this morning. Metformin was discontinued due to side effects and better glycemic control since quitting drinking alcohol. He received a new oxygen concentrator during this visit. He has all of his medications and wanted to clarify the metformin change. He denies any needs at this time Case Manager Clinical Goal(s): patient will report using  inhalers as prescribed including rinsing mouth after use patient will engage in lite exercise as tolerated to build/regain stamina and strength and reduce shortness of breath through activity tolerance patient will verbalize basic understanding of COPD disease process and self care activities patient will not be hospitalized for COPD exacerbation as evidenced  Patient will work with [facility] pharmacist for medication management-Met Patient will attend all scheduled appointments-Met Interventions:  Inter-disciplinary care team collaboration (see longitudinal plan of care)  Discussed future appointment with [name] 12/22/20 Advised to call pharmacy for any needed refills Provided RNCM contact information for any healthcare related barriers Advised patient to contact PCP with concerns or questions RNCM will follow closely Advised patient adhere to a diabetic diet Discussed medication changes made today at PCP visit  Patient Goals/Self-Care Activities:  - take all prescribed medication as directed-call for refills before you run out - attend next appointment with [name] on 12/19-take all of your medications with you to this appointment  - avoid outdoors during the warmest part of the day - avoid second hand smoke - identify and remove indoor air pollutants - limit outdoor activity during cold weather - listen  for public air quality announcements every day  - call PCP with new concerns or questions Follow Up Plan: Telephone follow up appointment with care management team member scheduled for:10/29/20 @ 10:30am     Care Plan : Wellness (Adult)  Updates made by James Montane, RN since 09/29/2020 12:00 AM  Completed 09/29/2020   Problem: (RNCM) Alcohol Use (Wellness) Resolved 09/29/2020     Long-Range Goal: Alcohol Use Managed Completed 09/29/2020  Start Date: 04/25/2020  Expected End Date: 09/25/2020  Recent Progress: On track  Priority: High  Note:   Current Barriers:  Ineffective Self Health Maintenance-James [name] was admitted on 4/16-4/21. Patient reports it was because of the alcohol. He wants to quit and feels that he can stop drinking on his own.  Update-James [name] reports feeling better and relates this to not drinking alcohol. He is very glad that he has stopped drinking, "quiting is the best thing I ever did" Does not adhere to provider recommendations re: quit drinking alcohol Currently UNABLE TO independently self manage needs related to chronic health conditions.  Knowledge Deficits related to short term plan for care coordination needs and long term plans for chronic disease management needs Nurse Case Manager Clinical Goal(s):  patient will work with care management team to address care coordination and chronic disease management needs related to alcohol use   Interventions:  Evaluation of current treatment plan related to alcohol use and quitting and patient's adherence to plan as established by provider. Advised patient to avoid friends/family/situations that drinking is involved, consider counseling for quitting drinking, reach out to supportive friends/family Discussed plans with patient for ongoing care management follow up and provided patient with direct contact information for care management team Provided therapeutic listening Congratulated patient on his progress, provided  encouragement Self Care Activities:  Patient will self administer medications as prescribed Patient will attend all scheduled provider appointments Patient will call pharmacy for medication refills Patient will call provider office for new concerns or questions Patient Goals: - avoid people and places where alcohol is used - develop a plan to avoid relapse  - think about counseling to assist in [place] - work with Ubaldo Glassing for therapy options Follow Up Plan: Telephone follow up appointment with care management team member scheduled for:09/25/20 @ 11:30am

## 2020-09-29 NOTE — Telephone Encounter (Signed)
Patient came to the clinic this morning and his POC was not working properly, no O2 flow.  He also noted that his O2 tanks at home are empty.   Call placed to [facility], spoke to Destiny and informed her of above. The patient needs a new POC delivered to [facility] so he is able to safely return home. Also informed her that patient's O2 tanks at home are empty and he needs them replaced. She said she would put in a ticket to expedite delivery of O2 to [facility] as well as back up tanks to patient's home.   Call placed to [facility] and explained above information and need for patient to have O2 for ride home.  Confirmed with Zack that patient has a Transportable Oxygen Concentrator (TOC)  which provides continuous flow O2, not pulse dose.  He said he would work to address these needs.  Patient had new TOC delivered to [facility] and it was working properly when he left the clinic.

## 2020-09-29 NOTE — Patient Instructions (Signed)
Stop metformin  Begin thiamine 100 mg daily and begin duloxetine 60 mg daily for nerve pain in the feet  Avoid alcohol as you are currently doing  No change in other medications  We will get your oxygen machine repaired and oxygen will be delivered for you to take home with you  Return to see [name] in 3 months

## 2020-09-29 NOTE — Patient Outreach (Addendum)
Medicaid Managed Care   Nurse Care Manager Note  09/29/2020 Name:  James [name] MRN:  [id number] DOB:  1960/12/25  James [name] is an 60 y.o. year old male who is a primary patient of [name], MD.  The [facility] Managed Care Coordination team was consulted for assistance with:    COPD DMII  James [name] was given information about Medicaid Managed Care Coordination team services today. James [name] Patient agreed to services and verbal consent obtained.  Engaged with patient by telephone for follow up visit in response to provider referral for case management and/or care coordination services.   Assessments/Interventions:  Review of past medical history, allergies, medications, health status, including review of consultants reports, laboratory and other test data, was performed as part of comprehensive evaluation and provision of chronic care management services.  SDOH (Social Determinants of Health) assessments and interventions performed:   Care Plan  Allergies  Allergen Reactions   Gabapentin Other (See Comments)    hallucinations    Medications Reviewed Today     Reviewed by [name], MD (Physician) on 09/29/20 at [place] List Status: <None>   Medication Order Taking? Sig Documenting Provider Last Dose Status Informant  albuterol (PROAIR HFA) 108 (90 Base) MCG/ACT inhaler [id number] Yes Inhale 2 puffs into the lungs every 4 (four) hours as needed. [name], MD Taking Active   albuterol (PROVENTIL) (2.5 MG/3ML) 0.083% nebulizer solution [id number] Yes TAKE 3 MLS (2.5 MG TOTAL) BY NEBULIZATION IN THE MORNING, AT NOON, AND AT BEDTIME. [name], MD Taking Active   DULoxetine (CYMBALTA) 60 MG capsule [id number] Yes Take 1 capsule (60 mg total) by mouth daily. [name], MD  Active   Fluticasone-Umeclidin-[facility] ELLIPTA) 200-62.5-25 MCG/INH AEPB [id number] Yes Inhale 1 puff into the lungs daily. [name], MD Taking Active    furosemide (LASIX) 20 MG tablet [id number] Yes Take 1 tablet (20 mg total) by mouth daily as needed for edema. [name], MD Taking Active   guaiFENesin (MUCINEX) 600 MG 12 hr tablet [id number] Yes Take 1 tablet (600 mg total) by mouth 2 (two) times daily as needed for cough or to loosen phlegm. [name], MD Taking Active   losartan (COZAAR) 50 MG tablet [id number] Yes Take 1 tablet (50 mg total) by mouth daily. [name], MD Taking Active   metoprolol succinate (TOPROL-XL) 25 MG 24 hr tablet [id number] Yes Take 1 tablet (25 mg total) by mouth daily. [name], MD Taking Active   pantoprazole (PROTONIX) 40 MG tablet [id number] Yes Take 1 tablet (40 mg total) by mouth daily. [name], MD Taking Active   predniSONE (DELTASONE) 10 MG tablet [id number] Yes Take 1 tablet (10 mg total) by mouth daily with breakfast. [name], MD Taking Active   Respiratory Therapy Supplies (FLUTTER) DEVI [id number] Yes Use 4 times daily [name], MD Taking Active Self  thiamine 100 MG tablet [id number] Yes Take 1 tablet (100 mg total) by mouth daily. [name], MD  Active             Patient Active Problem List   Diagnosis Date Noted   Neuropathy 09/29/2020   Obesity (BMI 30.0-34.9) 04/19/2020   Bronchiectasis ([place]) 09/24/2019   Type 2 diabetes mellitus without complication, without long-term current use of insulin ([place]) 07/24/2019   Other atopic dermatitis 05/01/2019   Chronic respiratory failure with hypoxia ([place]) 04/23/2019   Intellectual disability 04/23/2019  History of alcohol use 11/23/2018   Hypertension 05/17/2016   History of tobacco abuse 04/06/2016   COPD mixed type ([place]) 03/26/2016    Conditions to be addressed/monitored per PCP order:  COPD and DMII  Care Plan : COPD (Adult)  Updates made by [name], RN since 09/29/2020 12:00 AM     Problem: Symptom Exacerbation (COPD)      Long-Range Goal: Symptom Exacerbation Prevented or  Minimized   Start Date: 04/09/2020  Expected End Date: 10/29/2020  Recent Progress: On track  Priority: High  Note:   Current Barriers:  Knowledge deficits related to basic COPD self care/management-James [name] was recently admitted for COPD exacerbation and pnuemonia. He reports taking his medications, wearing oxygen continuously, and has a follow up with his PCP on 04/14/20. He does report having difficulty at times swallowing food and liquid, pain in back and legs, and difficulty falling asleep. He is planning to discuss these issues with his PCP. James [name] reports having transportation and plenty of food. He lives alone, and has support from his sister. James [name] attended his follow up with PCP. He was working outside at his home today and reports doing "about the same". He reports increased difficulty breathing when it is hot outside. He has his medications and oxygen, reports no needs at this time. He is working with [facility] pharmacist for medication management. He has not been drinking alcohol and reports feeling better since quitting.  Mr. Shetley recently hospitalized for COPD exacerbation, discharged home 8/11.  He using 2L of oxygen continuous and resting. He reports improvement with breathing after using new inhaler Anoro. Mr. Summer received the correct connector. He reports improvement with breathing since using new inhaler trelegy. Mr. Casanas reports not tolerating Metformin XR, having nausea and feeling bad. He prefers to take metformin twice daily. His food stamps have stopped and he is concerned about affording food. Mr. Grafton is aware of PCP appointment 9/26. He is not taking metformin and will discuss with [name]. He has transportation to this appointment. He continues to have concern regarding not receiving food stamps.-Update-James [name] attended PCP appointment this morning. Metformin was discontinued due to side effects and better glycemic control since quitting drinking alcohol. He  received a new oxygen concentrator during this visit. He has all of his medications and wanted to clarify the metformin change. He denies any needs at this time Case Manager Clinical Goal(s): patient will report using inhalers as prescribed including rinsing mouth after use patient will engage in lite exercise as tolerated to build/regain stamina and strength and reduce shortness of breath through activity tolerance patient will verbalize basic understanding of COPD disease process and self care activities patient will not be hospitalized for COPD exacerbation as evidenced  Patient will work with [facility] pharmacist for medication management-Met Patient will attend all scheduled appointments-Met Interventions:  Inter-disciplinary care team collaboration (see longitudinal plan of care)  Discussed future appointment with [name] 12/22/20 Advised to call pharmacy for any needed refills Provided RNCM contact information for any healthcare related barriers Advised patient to contact PCP with concerns or questions RNCM will follow closely Advised patient adhere to a diabetic diet Discussed medication changes made today at PCP visit  Patient Goals/Self-Care Activities:  - take all prescribed medication as directed-call for refills before you run out - attend next appointment with [name] on 12/19-take all of your medications with you to this appointment  - avoid outdoors during the warmest part of the day -  avoid second hand smoke - identify and remove indoor air pollutants - limit outdoor activity during cold weather - listen for public air quality announcements every day  - call PCP with new concerns or questions Follow Up Plan: Telephone follow up appointment with care management team member scheduled for:10/29/20 @ 10:30am     Care Plan : Wellness (Adult)  Updates made by [name], RN since 09/29/2020 12:00 AM  Completed 09/29/2020   Problem: (RNCM) Alcohol Use (Wellness)  Resolved 09/29/2020     Long-Range Goal: Alcohol Use Managed Completed 09/29/2020  Start Date: 04/25/2020  Expected End Date: 09/25/2020  Recent Progress: On track  Priority: High  Note:   Current Barriers:  Ineffective Self Health Maintenance-Mr. Kellogg was admitted on 4/16-4/21. Patient reports it was because of the alcohol. He wants to quit and feels that he can stop drinking on his own.  Update-Mr. Bensinger reports feeling better and relates this to not drinking alcohol. He is very glad that he has stopped drinking, "quiting is the best thing I ever did" Does not adhere to provider recommendations re: quit drinking alcohol Currently UNABLE TO independently self manage needs related to chronic health conditions.  Knowledge Deficits related to short term plan for care coordination needs and long term plans for chronic disease management needs Nurse Case Manager Clinical Goal(s):  patient will work with care management team to address care coordination and chronic disease management needs related to alcohol use   Interventions:  Evaluation of current treatment plan related to alcohol use and quitting and patient's adherence to plan as established by provider. Advised patient to avoid friends/family/situations that drinking is involved, consider counseling for quitting drinking, reach out to supportive friends/family Discussed plans with patient for ongoing care management follow up and provided patient with direct contact information for care management team Provided therapeutic listening Congratulated patient on his progress, provided encouragement Self Care Activities:  Patient will self administer medications as prescribed Patient will attend all scheduled provider appointments Patient will call pharmacy for medication refills Patient will call provider office for new concerns or questions Patient Goals: - avoid people and places where alcohol is used - develop a plan to avoid relapse  -  think about counseling to assist in [employer] - work with Ubal[facility] Glassing for therapy options Follow Up Plan: Telephone follow up appointment with care management team member scheduled for:09/25/20 @ 11:30am      Follow Up:  Patient agrees to Care Plan and Follow-up.  Plan: The Managed Medicaid care management team will reach out to the patient again over the next 30 days.  Date/time of next scheduled RN care management/care coordination outreach:  10/29/20 @ 10:30am  [name] RN, BSN [place] RN Care Coordinator

## 2020-09-29 NOTE — Assessment & Plan Note (Signed)
A1c less than 7 off metformin we will monitor for now off medications

## 2020-09-29 NOTE — Assessment & Plan Note (Signed)
Patient states he is no longer drinking alcohol

## 2020-09-29 NOTE — Assessment & Plan Note (Signed)
Associated with alcohol use plan to resume thiamine 100 mg daily and trial of duloxetine 60 mg daily

## 2020-09-29 NOTE — Assessment & Plan Note (Signed)
Oxygen saturation on room air actually quite good 96% the patient concentrator is not functioning well [facility] brought the patient a new concentrator at this visit and changed out from his old unit

## 2020-09-29 NOTE — Assessment & Plan Note (Signed)
Blood pressure well controlled at this time no change in medication will continue losartan 50 mg daily  Noted at last visit renal function was normal potassium normal on this dose of losartan

## 2020-09-29 NOTE — Assessment & Plan Note (Signed)
Mixed COPD stable at this time continue Trelegy

## 2020-10-16 ENCOUNTER — Telehealth: Payer: Self-pay

## 2020-10-16 ENCOUNTER — Other Ambulatory Visit: Payer: Self-pay

## 2020-10-16 NOTE — Telephone Encounter (Signed)
Call received from patient's sister, James [name] inquiring about assistance for patient with completing an application for food stamps. Informed her that a referral was made to [employer] for assistance with this. This CM to check on the status of that referral which was made at the end of August 2022. James [name] also noted that James [name] has quit working.  Secure email sent to Manuela Schwartz [employer] requesting an update on the referral.

## 2020-10-23 ENCOUNTER — Other Ambulatory Visit: Payer: Self-pay

## 2020-10-29 ENCOUNTER — Other Ambulatory Visit: Payer: Self-pay | Admitting: *Deleted

## 2020-10-29 ENCOUNTER — Other Ambulatory Visit: Payer: Self-pay

## 2020-10-29 DIAGNOSIS — Z599 Problem related to housing and economic circumstances, unspecified: Secondary | ICD-10-CM

## 2020-10-29 NOTE — Patient Instructions (Signed)
Visit Information  James [name] was given information about Medicaid Managed Care team care coordination services as a part of their [facility] Medicaid benefit. James [name] verbally consented to engagement with the [name] Managed Care team.   If you are experiencing a medical emergency, please call 911 or report to your local emergency department or urgent care.   If you have a non-emergency medical problem during routine business hours, please contact your provider's office and ask to speak with a nurse.   For questions related to your Healthy [facility] health plan, please call: [phone] or visit the homepage here: [email or web address]  If you would like to schedule transportation through your Healthy [facility] plan, please call the following number at least 2 days in advance of your appointment: [phone]  Call the [place] at [phone], at any time, 24 hours a day, 7 days a week. If you are in danger or need immediate medical attention call 911.  If you would like help to quit smoking, call 1-800-QUIT-NOW (573) 113-9695) OR Espaol: 1-855-Djelo-Ya (6-789-381-0175) o para ms informacin haga clic aqu or Text READY to 200-400 to register via text  James [name] - following are the goals we discussed in your visit today:   Goals Addressed             This Visit's Progress    Manage Fatigue (Tiredness-COPD)       Follow Up Date 11/06/20   - develop a new routine to improve sleep - get at least 7 to 8 hours of sleep at night - keep room cool and dark - practice relaxation or meditation daily    Why is this important?   Feeling tired or worn out is a common symptom of COPD (chronic obstructive pulmonary disease).  Learning when you feel your best and when you need rest is important.  Managing the tiredness (fatigue) will help you be active and enjoy life.          Track and Manage My Triggers-COPD        Timeframe:  Long-Range Goal Priority:  High Start Date:  04/09/20                           Expected End Date:   11/06/20                    Follow Up Date 11/06/20  - take all prescribed medication as directed-call for refills before you run out - attend next appointment with [name] on 12/19-take all of your medications with you to this appointment  - avoid outdoors during the warmest part of the day - avoid second hand smoke - identify and remove indoor air pollutants - limit outdoor activity during cold weather - listen for public air quality announcements every day  - call PCP with new concerns or questions    Why is this important?   Triggers are activities or things, like tobacco smoke or cold weather, that make your COPD (chronic obstructive pulmonary disease) flare-up.  Knowing these triggers helps you plan how to stay away from them.  When you cannot remove them, you can learn how to manage them.             Please see education materials related to COPD provided as print materials.   The patient verbalized understanding of instructions provided today and agreed to receive a mailed copy of patient instruction and/or educational  materials.  Telephone follow up appointment with Managed Medicaid care management team member scheduled for:11/06/20 @ 11:15am  [name] RN, BSN [place] RN Care Coordinator   Following is a copy of your plan of care:  Care Plan : COPD (Adult)  Updates made by [name], RN since 10/29/2020 12:00 AM     Problem: Symptom Exacerbation (COPD)      Long-Range Goal: Symptom Exacerbation Prevented or Minimized   Start Date: 04/09/2020  Expected End Date: 11/06/2020  Recent Progress: On track  Priority: High  Note:   Current Barriers:  Knowledge deficits related to basic COPD self care/management-James [name] was recently admitted for COPD exacerbation and pnuemonia. He reports taking his  medications, wearing oxygen continuously, and has a follow up with his PCP on 04/14/20. He does report having difficulty at times swallowing food and liquid, pain in back and legs, and difficulty falling asleep. He is planning to discuss these issues with his PCP. James [name] reports having transportation and plenty of food. He lives alone, and has support from his sister. James [name] attended his follow up with PCP. He was working outside at his home today and reports doing "about the same". He reports increased difficulty breathing when it is hot outside. He has his medications and oxygen, reports no needs at this time. He is working with [facility] pharmacist for medication management. He has not been drinking alcohol and reports feeling better since quitting.  James [name] recently hospitalized for COPD exacerbation, discharged home 8/11.  He using 2L of oxygen continuous and resting. He reports improvement with breathing after using new inhaler Anoro. James [name] received the correct connector. He reports improvement with breathing since using new inhaler trelegy. James [name] reports not tolerating Metformin XR, having nausea and feeling bad. He prefers to take metformin twice daily. His food stamps have stopped and he is concerned about affording food. James [name] is aware of PCP appointment 9/26. He is not taking metformin and will discuss with [name]. He has transportation to this appointment. He continues to have concern regarding not receiving food stamps. Metformin was discontinued due to side effects and better glycemic control since quitting drinking alcohol. He received a new oxygen concentrator during this visit. -Update-James [name] is having difficulty affording food. He has not heard anything from referral placed in August for assistance with food stamp application. He continues to have a difficulty with breathing, especially when it is hot. James [name] had to quit working due to his health and needs financial  assistance. Case Manager Clinical Goal(s): patient will report using inhalers as prescribed including rinsing mouth after use patient will engage in lite exercise as tolerated to build/regain stamina and strength and reduce shortness of breath through activity tolerance patient will verbalize basic understanding of COPD disease process and self care activities patient will not be hospitalized for COPD exacerbation as evidenced  Patient will work with [facility] pharmacist for medication management-Met Patient will attend all scheduled appointments-Met Interventions:  Inter-disciplinary care team collaboration (see longitudinal plan of care)  Discussed future appointment with [name] 12/22/20 Advised to call pharmacy for any needed refills Reviewed medications and discussed the importance of using nebulizer as directed(3 times a day) Advised patient to contact PCP with concerns or questions RNCM will follow closely Collaborate with Wanita Chamberlain for update re: referral to [employer] for assistance with Food Stamps Referral placed for Care Guide assistance with local food pantries and financial  assistance Patient Goals/Self-Care Activities:  - take all prescribed medication as directed-call for refills before you run out - attend next appointment with [name] on 12/19-take all of your medications with you to this appointment  - avoid outdoors during the warmest part of the day - avoid second hand smoke - identify and remove indoor air pollutants - limit outdoor activity during cold weather - listen for public air quality announcements every day  - call PCP with new concerns or questions Follow Up Plan: Telephone follow up appointment with care management team member scheduled for:11/06/20 @ 11:15am

## 2020-10-29 NOTE — Patient Outreach (Signed)
Medicaid Managed Care   Nurse Care Manager Note  10/29/2020 Name:  DEMARRIO [name] MRN:  [id number] DOB:  10-14-1960  James [name] is an 60 y.o. year old male who is a primary patient of James Gaskins Burnett Harry, MD.  The [facility] Managed Care Coordination team was consulted for assistance with:    COPD  [name] was given information about Medicaid Managed Care Coordination team services today. [name] Patient agreed to services and verbal consent obtained.  Engaged with patient by telephone for follow up visit in response to provider referral for case management and/or care coordination services.   Assessments/Interventions:  Review of past medical history, allergies, medications, health status, including review of consultants reports, laboratory and other test data, was performed as part of comprehensive evaluation and provision of chronic care management services.  SDOH (Social Determinants of Health) assessments and interventions performed: SDOH Interventions    Flowsheet Row Most Recent Value  SDOH Interventions   Food Insecurity Interventions Other (Comment)  [Care Guide referral placed]  Financial Strain Interventions Other (Comment)  [Care Guide referral placed]       Care Plan  Allergies  Allergen Reactions   Gabapentin Other (See Comments)    hallucinations    Medications Reviewed Today     Reviewed by [name], RN (Registered Nurse) on 10/29/20 at 55  Med List Status: <None>   Medication Order Taking? Sig Documenting Provider Last Dose Status Informant  albuterol (PROAIR HFA) 108 (90 Base) MCG/ACT inhaler [id number] Yes Inhale 2 puffs into the lungs every 4 (four) hours as needed. [name], MD Taking Active   albuterol (PROVENTIL) (2.5 MG/3ML) 0.083% nebulizer solution [id number] Yes TAKE 3 MLS (2.5 MG TOTAL) BY NEBULIZATION IN THE MORNING, AT NOON, AND AT BEDTIME. [name], MD Taking Active            Med Note (Arlester Keehan A   Wed  Oct 29, 2020 10:55 AM) Only using at night  DULoxetine (CYMBALTA) 60 MG capsule [id number] Yes Take 1 capsule (60 mg total) by mouth daily. [name], MD Taking Active   Fluticasone-Umeclidin-[facility] ELLIPTA) 200-62.5-25 MCG/INH AEPB [id number] No Inhale 1 puff into the lungs daily.  Patient not taking: Reported on 10/29/2020   [name], MD Not Taking Active            Med Note (Tazaria Dlugosz A   Wed Oct 29, 2020 10:52 AM) Needs to call for refill  furosemide (LASIX) 20 MG tablet [id number] Yes Take 1 tablet (20 mg total) by mouth daily as needed for edema. [name], MD Taking Active   guaiFENesin (MUCINEX) 600 MG 12 hr tablet [id number] Yes Take 1 tablet (600 mg total) by mouth 2 (two) times daily as needed for cough or to loosen phlegm. [name], MD Taking Active   losartan (COZAAR) 50 MG tablet [id number] Yes Take 1 tablet (50 mg total) by mouth daily. [name], MD Taking Active   metoprolol succinate (TOPROL-XL) 25 MG 24 hr tablet [id number] Yes Take 1 tablet (25 mg total) by mouth daily. [name], MD Taking Active   pantoprazole (PROTONIX) 40 MG tablet [id number] Yes Take 1 tablet (40 mg total) by mouth daily. [name], MD Taking Active   predniSONE (DELTASONE) 10 MG tablet [id number] Yes Take 1 tablet (10 mg total) by mouth daily with breakfast. [name], MD Taking Active   Respiratory Therapy Supplies (FLUTTER) DEVI [id number] Yes  Use 4 times daily [name], MD Taking Active Self  thiamine 100 MG tablet [id number] Yes Take 1 tablet (100 mg total) by mouth daily. [name], MD Taking Active             Patient Active Problem List   Diagnosis Date Noted   Neuropathy 09/29/2020   Obesity (BMI 30.0-34.9) 04/19/2020   Bronchiectasis ([place]) 09/24/2019   Type 2 diabetes mellitus without complication, without long-term current use of insulin ([place]) 07/24/2019   Other atopic dermatitis 05/01/2019   Chronic  respiratory failure with hypoxia ([place]) 04/23/2019   Intellectual disability 04/23/2019   History of alcohol use 11/23/2018   Hypertension 05/17/2016   History of tobacco abuse 04/06/2016   COPD mixed type ([place]) 03/26/2016    Conditions to be addressed/monitored per PCP order:  COPD  Care Plan : COPD (Adult)  Updates made by [name], RN since 10/29/2020 12:00 AM     Problem: Symptom Exacerbation (COPD)      Long-Range Goal: Symptom Exacerbation Prevented or Minimized   Start Date: 04/09/2020  Expected End Date: 11/06/2020  Recent Progress: On track  Priority: High  Note:   Current Barriers:  Knowledge deficits related to basic COPD self care/management-[name] was recently admitted for COPD exacerbation and pnuemonia. He reports taking his medications, wearing oxygen continuously, and has a follow up with his PCP on 04/14/20. He does report having difficulty at times swallowing food and liquid, pain in back and legs, and difficulty falling asleep. He is planning to discuss these issues with his PCP. [name] reports having transportation and plenty of food. He lives alone, and has support from his sister. [name] attended his follow up with PCP. He was working outside at his home today and reports doing "about the same". He reports increased difficulty breathing when it is hot outside. He has his medications and oxygen, reports no needs at this time. He is working with [facility] pharmacist for medication management. He has not been drinking alcohol and reports feeling better since quitting.  [name] recently hospitalized for COPD exacerbation, discharged home 8/11.  He using 2L of oxygen continuous and resting. He reports improvement with breathing after using new inhaler Anoro. [name] received the correct connector. He reports improvement with breathing since using new inhaler trelegy. [name] reports not tolerating Metformin XR, having nausea and feeling bad. He prefers to  take metformin twice daily. His food stamps have stopped and he is concerned about affording food. [name] is aware of PCP appointment 9/26. He is not taking metformin and will discuss with [name]. He has transportation to this appointment. He continues to have concern regarding not receiving food stamps. Metformin was discontinued due to side effects and better glycemic control since quitting drinking alcohol. He received a new oxygen concentrator during this visit. -Update-Mr. Mash is having difficulty affording food. He has not heard anything from referral placed in August for assistance with food stamp application. He continues to have a difficulty with breathing, especially when it is hot. Mr. Parmelee had to quit working due to his health and needs financial assistance. Case Manager Clinical Goal(s): patient will report using inhalers as prescribed including rinsing mouth after use patient will engage in lite exercise as tolerated to build/regain stamina and strength and reduce shortness of breath through activity tolerance patient will verbalize basic understanding of COPD disease process and self care activities patient will not be hospitalized for COPD exacerbation as  evidenced  Patient will work with [facility] pharmacist for medication management-Met Patient will attend all scheduled appointments-Met Interventions:  Inter-disciplinary care team collaboration (see longitudinal plan of care)  Discussed future appointment with [name] 12/22/20 Advised to call pharmacy for any needed refills Reviewed medications and discussed the importance of using nebulizer as directed(3 times a day) Advised patient to contact PCP with concerns or questions RNCM will follow closely Collaborate with Wanita Chamberlain for update re: referral to [employer] for assistance with Food Stamps Referral placed for Care Guide assistance with local food pantries and financial assistance Patient Goals/Self-Care  Activities:  - take all prescribed medication as directed-call for refills before you run out - attend next appointment with [name] on 12/19-take all of your medications with you to this appointment  - avoid outdoors during the warmest part of the day - avoid second hand smoke - identify and remove indoor air pollutants - limit outdoor activity during cold weather - listen for public air quality announcements every day  - call PCP with new concerns or questions Follow Up Plan: Telephone follow up appointment with care management team member scheduled for:11/06/20 @ 11:15am      Follow Up:  Patient agrees to Care Plan and Follow-up.  Plan: The Managed Medicaid care management team will reach out to the patient again over the next 7 days.  Date/time of next scheduled RN care management/care coordination outreach:  11/06/20 @ 11:15am  [name] RN, BSN [place]  [employer] manager

## 2020-10-30 ENCOUNTER — Other Ambulatory Visit: Payer: Self-pay | Admitting: *Deleted

## 2020-10-30 DIAGNOSIS — Z5941 Food insecurity: Secondary | ICD-10-CM

## 2020-11-05 ENCOUNTER — Telehealth: Payer: Self-pay

## 2020-11-05 NOTE — Telephone Encounter (Signed)
   Telephone encounter was:  Successful.  11/05/2020 Name: James [name] MRN: [id number] DOB: January 05, 1960  James [name] is a 60 y.o. year old male who is a primary care patient of [name], MD . The community resource team was consulted for assistance with [place] guide performed the following interventions: Spoke with patient's sister [name]. I verified patient's home address and will mail a list of food pantries. Unable to leave message on patient's cellphone voicemail is full. Sent letter to [place] to be printed and mailed to patient. Letter saved in Epic.  Follow Up Plan:  Care guide will follow up with patient by phone over the next 7-10 days.  Lexi Conaty, AAS Paralegal, [place] Management  [address],  [address] ??[email or web address]  ?? [phone]   [email or web address]

## 2020-11-06 ENCOUNTER — Other Ambulatory Visit: Payer: Self-pay | Admitting: Critical Care Medicine

## 2020-11-06 ENCOUNTER — Other Ambulatory Visit: Payer: Self-pay | Admitting: *Deleted

## 2020-11-06 ENCOUNTER — Other Ambulatory Visit: Payer: Self-pay | Admitting: Pharmacist

## 2020-11-06 ENCOUNTER — Other Ambulatory Visit: Payer: Self-pay

## 2020-11-06 MED ORDER — TRELEGY ELLIPTA 100-62.5-25 MCG/ACT IN AEPB
1.0000 | INHALATION_SPRAY | Freq: Every day | RESPIRATORY_TRACT | 2 refills | Status: DC
  Filled 2020-11-06 – 2020-11-24 (×2): qty 60, 30d supply, fill #0

## 2020-11-06 NOTE — Patient Outreach (Addendum)
Medicaid Managed Care   Nurse Care Manager Note  11/06/2020 Name:  MARQUELLE MUSGRAVE MRN:  [id number] DOB:  04/03/1960  BERLE FITZ is an 60 y.o. year old male who is a primary patient of [name], MD.  The [facility] Managed Care Coordination team was consulted for assistance with:    COPD  Mr. Schmieder was given information about Medicaid Managed Care Coordination team services today. [name] Patient agreed to services and verbal consent obtained.  Engaged with patient by telephone for follow up visit in response to provider referral for case management and/or care coordination services.   Assessments/Interventions:  Review of past medical history, allergies, medications, health status, including review of consultants reports, laboratory and other test data, was performed as part of comprehensive evaluation and provision of chronic care management services.  SDOH (Social Determinants of Health) assessments and interventions performed:   Care Plan  Allergies  Allergen Reactions   Gabapentin Other (See Comments)    hallucinations    Medications Reviewed Today     Reviewed by [name], RN (Registered Nurse) on 11/06/20 at 1127  Med List Status: <None>   Medication Order Taking? Sig Documenting Provider Last Dose Status Informant  albuterol (PROAIR HFA) 108 (90 Base) MCG/ACT inhaler [id number] Yes Inhale 2 puffs into the lungs every 4 (four) hours as needed. [name], MD Taking Active   albuterol (PROVENTIL) (2.5 MG/3ML) 0.083% nebulizer solution [id number] Yes TAKE 3 MLS (2.5 MG TOTAL) BY NEBULIZATION IN THE MORNING, AT NOON, AND AT BEDTIME. [name], MD Taking Active            Med Note [name], Favor Hackler A   Thu Nov 06, 2020 11:26 AM)    DULoxetine (CYMBALTA) 60 MG capsule [id number] Yes Take 1 capsule (60 mg total) by mouth daily. [name], MD Taking Active   Fluticasone-Umeclidin-[facility]ilant [facility] ELLIPTA) 200-62.5-25 MCG/INH AEPB [id number] No  Inhale 1 puff into the lungs daily.  Patient not taking: No sig reported   [name], MD Not Taking Active            Med Note (Elah Avellino A   Wed Oct 29, 2020 10:52 AM) Needs to call for refill  furosemide (LASIX) 20 MG tablet [id number] Yes Take 1 tablet (20 mg total) by mouth daily as needed for edema. [name], MD Taking Active   guaiFENesin (MUCINEX) 600 MG 12 hr tablet [id number] Yes Take 1 tablet (600 mg total) by mouth 2 (two) times daily as needed for cough or to loosen phlegm. [name], MD Taking Active   losartan (COZAAR) 50 MG tablet [id number] Yes Take 1 tablet (50 mg total) by mouth daily. [name], MD Taking Active   metoprolol succinate (TOPROL-XL) 25 MG 24 hr tablet [id number] Yes Take 1 tablet (25 mg total) by mouth daily. [name], MD Taking Active   pantoprazole (PROTONIX) 40 MG tablet [id number] Yes Take 1 tablet (40 mg total) by mouth daily. [name], MD Taking Active   predniSONE (DELTASONE) 10 MG tablet [id number] Yes Take 1 tablet (10 mg total) by mouth daily with breakfast. [name], MD Taking Active   Respiratory Therapy Supplies (FLUTTER) DEVI [id number] Yes Use 4 times daily [name], MD Taking Active Self  thiamine 100 MG tablet [id number] Yes Take 1 tablet (100 mg total) by mouth daily. [name], MD Taking Active  Patient Active Problem List   Diagnosis Date Noted   Neuropathy 09/29/2020   Obesity (BMI 30.0-34.9) 04/19/2020   Bronchiectasis ([place]) 09/24/2019   Type 2 diabetes mellitus without complication, without long-term current use of insulin ([place]) 07/24/2019   Other atopic dermatitis 05/01/2019   Chronic respiratory failure with hypoxia ([place]) 04/23/2019   Intellectual disability 04/23/2019   History of alcohol use 11/23/2018   Hypertension 05/17/2016   History of tobacco abuse 04/06/2016   COPD mixed type ([place]) 03/26/2016    Conditions to be addressed/monitored per PCP  order:  COPD  Care Plan : COPD (Adult)  Updates made by [name], RN since 11/06/2020 12:00 AM     Problem: Symptom Exacerbation (COPD)      Long-Range Goal: Symptom Exacerbation Prevented or Minimized   Start Date: 04/09/2020  Expected End Date: 11/06/2020  Recent Progress: On track  Priority: High  Note:   Current Barriers:  Knowledge deficits related to basic COPD self care/management-Mr. Greenblatt was recently admitted for COPD exacerbation and pnuemonia. He reports taking his medications, wearing oxygen continuously, and has a follow up with his PCP on 04/14/20. He does report having difficulty at times swallowing food and liquid, pain in back and legs, and difficulty falling asleep. He is planning to discuss these issues with his PCP. Mr. Janowiak reports having transportation and plenty of food. He lives alone, and has support from his sister. He was working outside at his home today and reports doing "about the same". He reports increased difficulty breathing when it is hot outside. He is working with [facility] pharmacist for medication management. He has not been drinking alcohol and reports feeling better since quitting.  Mr. Bismarck recently hospitalized for COPD exacerbation, discharged home 8/11.  He using 2L of oxygen continuous and resting. He reports improvement with breathing after using new inhaler Anoro. Mr. Dubree received the correct connector. He reports improvement with breathing since using new inhaler trelegy. Mr. Madariaga reports not tolerating Metformin XR, having nausea and feeling bad. He prefers to take metformin twice daily. His food stamps have stopped and he is concerned about affording food. He is not taking metformin and will discuss with [name]. He continues to have concern regarding not receiving food stamps. Metformin was discontinued due to side effects and better glycemic control since quitting drinking alcohol. He received a new oxygen concentrator during this visit.  Mr. Dollar is having difficulty affording food. He has not heard anything from referral placed in August for assistance with food stamp application. He continues to have a difficulty with breathing, especially when it is hot. Mr. Hull had to quit working due to his health and needs financial assistance.-Update-Mr. Crickenberger reports not feeling good this morning, denies swelling, has not checked oxygen saturation. Not using trelegy-needs refill. Care Guide has mailed a list of food pantries. Case Manager Clinical Goal(s): patient will report using inhalers as prescribed including rinsing mouth after use patient will engage in lite exercise as tolerated to build/regain stamina and strength and reduce shortness of breath through activity tolerance patient will verbalize basic understanding of COPD disease process and self care activities patient will not be hospitalized for COPD exacerbation as evidenced  Patient will work with [facility] pharmacist for medication management-Met Patient will attend all scheduled appointments-Met Interventions:  Inter-disciplinary care team collaboration (see longitudinal plan of care)  Discussed future appointment with [name] 12/22/20 Requested refill for trelegy, advised patient to pick medication up today and start using Reviewed medications  and discussed the importance of requesting medications before running out  Advised patient to contact PCP with concerns or questions RNCM will follow closely Discussed list of food pantries provided by Care Guide, advised of location near patient's home Provided information on using inhaler and nebulizer Advised patient to check his oxygen level and notify provider if less than 90  Patient Goals/Self-Care Activities:  - take all prescribed medication as directed-- take all prescribed medication as directed-Pick up trelegy at [facility]-requested 11/06/20 - attend next appointment with [name] on  12/19-take all of your medications with you to this appointment  - avoid outdoors during the warmest part of the day - avoid second hand smoke - identify and remove indoor air pollutants - limit outdoor activity during cold weather - listen for public air quality announcements every day  - call PCP with new concerns or questions Follow Up Plan: Telephone follow up appointment with care management team member scheduled for:11/13/20 @ 12:45pm      Follow Up:  Patient agrees to Care Plan and Follow-up.  Plan: The Managed Medicaid care management team will reach out to the patient again over the next 7 days.  Date/time of next scheduled RN care management/care coordination outreach:  11/13/20 @ 12:45pm  [name] RN, BSN [place]  [employer] manager

## 2020-11-06 NOTE — Patient Instructions (Addendum)
Visit Information  Mr. Hufstetler was given information about Medicaid Managed Care team care coordination services as a part of their Healthy [facility] Medicaid benefit. [name] verbally consented to engagement with the [facility] Managed Care team.   If you are experiencing a medical emergency, please call 911 or report to your local emergency department or urgent care.   If you have a non-emergency medical problem during routine business hours, please contact your provider's office and ask to speak with a nurse.   For questions related to your Healthy [facility] health plan, please call: [phone] or visit the homepage here: [email or web address]  If you would like to schedule transportation through your Healthy [facility] plan, please call the following number at least 2 days in advance of your appointment: [phone]  Call the [facility] at [phone], at any time, 24 hours a day, 7 days a week. If you are in danger or need immediate medical attention call 911.  If you would like help to quit smoking, call 1-800-QUIT-NOW 872-716-7518) OR Espaol: 1-855-Djelo-Ya (0-347-425-9563) o para ms informacin haga clic aqu or Text READY to 200-400 to register via text  Mr. Coccia - following are the goals we discussed in your visit today:   Goals Addressed             This Visit's Progress    Manage Fatigue (Tiredness-COPD)       Follow Up Date 11/13/20   - develop a new routine to improve sleep - get at least 7 to 8 hours of sleep at night - keep room cool and dark - practice relaxation or meditation daily    Why is this important?   Feeling tired or worn out is a common symptom of COPD (chronic obstructive pulmonary disease).  Learning when you feel your best and when you need rest is important.  Managing the tiredness (fatigue) will help you be active and enjoy life.          Track and Manage My Triggers-COPD        Timeframe:  Long-Range Goal Priority:  High Start Date:  04/09/20                           Expected End Date:   11/13/20                    Follow Up Date 11/13/20  - take all prescribed medication as directed-Pick up trelegy at [facility]-requested 11/06/20 - attend next appointment with [name] on 12/19-take all of your medications with you to this appointment  - avoid outdoors during the warmest part of the day - avoid second hand smoke - identify and remove indoor air pollutants - limit outdoor activity during cold weather - listen for public air quality announcements every day  - call PCP with new concerns or questions    Why is this important?   Triggers are activities or things, like tobacco smoke or cold weather, that make your COPD (chronic obstructive pulmonary disease) flare-up.  Knowing these triggers helps you plan how to stay away from them.  When you cannot remove them, you can learn how to manage them.             Please see education materials related to COPD provided by MyChart link. and as Ad[employer] account planner.   The patient verbalized understanding of instructions provided today and agreed to receive a  mailed copy of patient instruction and/or educational materials.  Telephone follow up appointment with Managed Medicaid care management team member scheduled for:11/13/20 @ 12:45pm  [name] RN, BSN [place] RN Care Coordinator   Following is a copy of your plan of care:  Care Plan : COPD (Adult)  Updates made by [name], RN since 11/06/2020 12:00 AM     Problem: Symptom Exacerbation (COPD)      Long-Range Goal: Symptom Exacerbation Prevented or Minimized   Start Date: 04/09/2020  Expected End Date: 11/06/2020  Recent Progress: On track  Priority: High  Note:   Current Barriers:  Knowledge deficits related to basic COPD self care/management-Mr. Semmel was recently admitted for  COPD exacerbation and pnuemonia. He reports taking his medications, wearing oxygen continuously, and has a follow up with his PCP on 04/14/20. He does report having difficulty at times swallowing food and liquid, pain in back and legs, and difficulty falling asleep. He is planning to discuss these issues with his PCP. Mr. Christina reports having transportation and plenty of food. He lives alone, and has support from his sister. He was working outside at his home today and reports doing "about the same". He reports increased difficulty breathing when it is hot outside. He is working with [facility] pharmacist for medication management. He has not been drinking alcohol and reports feeling better since quitting.  Mr. Taney recently hospitalized for COPD exacerbation, discharged home 8/11.  He using 2L of oxygen continuous and resting. He reports improvement with breathing after using new inhaler Anoro. Mr. Drotar received the correct connector. He reports improvement with breathing since using new inhaler trelegy. Mr. Dirocco reports not tolerating Metformin XR, having nausea and feeling bad. He prefers to take metformin twice daily. His food stamps have stopped and he is concerned about affording food. He is not taking metformin and will discuss with [name]. He continues to have concern regarding not receiving food stamps. Metformin was discontinued due to side effects and better glycemic control since quitting drinking alcohol. He received a new oxygen concentrator during this visit. Mr. Heinbaugh is having difficulty affording food. He has not heard anything from referral placed in August for assistance with food stamp application. He continues to have a difficulty with breathing, especially when it is hot. Mr. Stecher had to quit working due to his health and needs financial assistance.-Update-Mr. Disney reports not feeling good this morning, denies swelling, has not checked oxygen saturation. Not using trelegy-needs refill.  Care Guide has mailed a list of food pantries. Case Manager Clinical Goal(s): patient will report using inhalers as prescribed including rinsing mouth after use patient will engage in lite exercise as tolerated to build/regain stamina and strength and reduce shortness of breath through activity tolerance patient will verbalize basic understanding of COPD disease process and self care activities patient will not be hospitalized for COPD exacerbation as evidenced  Patient will work with [facility] pharmacist for medication management-Met Patient will attend all scheduled appointments-Met Interventions:  Inter-disciplinary care team collaboration (see longitudinal plan of care)  Discussed future appointment with [name] 12/22/20 Requested refill for trelegy, advised patient to pick medication up today and start using Reviewed medications and discussed the importance of requesting medications before running out  Advised patient to contact PCP with concerns or questions RNCM will follow closely Discussed list of food pantries provided by Care Guide, advised of location near patient's home Provided information on using inhaler and nebulizer Advised patient  to check his oxygen level and notify provider if less than 90  Patient Goals/Self-Care Activities:  - take all prescribed medication as directed-- take all prescribed medication as directed-Pick up trelegy at [facility] pharmacy-requested 11/06/20 - attend next appointment with [name] on 12/19-take all of your medications with you to this appointment  - avoid outdoors during the warmest part of the day - avoid second hand smoke - identify and remove indoor air pollutants - limit outdoor activity during cold weather - listen for public air quality announcements every day  - call PCP with new concerns or questions Follow Up Plan: Telephone follow up appointment with care management team member scheduled for:11/13/20 @  12:45pm

## 2020-11-10 ENCOUNTER — Other Ambulatory Visit: Payer: Medicaid Other | Admitting: Hospice

## 2020-11-10 ENCOUNTER — Other Ambulatory Visit: Payer: Self-pay

## 2020-11-10 DIAGNOSIS — J441 Chronic obstructive pulmonary disease with (acute) exacerbation: Secondary | ICD-10-CM

## 2020-11-10 DIAGNOSIS — R531 Weakness: Secondary | ICD-10-CM

## 2020-11-10 DIAGNOSIS — Z515 Encounter for palliative care: Secondary | ICD-10-CM

## 2020-11-10 DIAGNOSIS — E119 Type 2 diabetes mellitus without complications: Secondary | ICD-10-CM

## 2020-11-10 DIAGNOSIS — Z720 Tobacco use: Secondary | ICD-10-CM

## 2020-11-10 NOTE — Progress Notes (Signed)
[place] Consult Note Telephone: [phone]  Fax: [phone]  PATIENT NAME: James [name] DOB: 04-20-1960 MRN: [id number]  PRIMARY CARE PROVIDER:   [name], MD [name], MD [address],  [place] [address]  REFERRING PROVIDER: [name], MD [name], MD [address],  [place] [address]  RESPONSIBLE PARTY:  [place]     Name Relation Home Work [place] Sister   [phone]   [name] [phone]     [place], [place] Sister   [phone]      TELEHEALTH VISIT STATEMENT Due to the COVID-19 crisis, this visit was done via telemedicine and it was initiated and consent by this patient and or family. Video-audio (telehealth) contact was unable to be done due to technical barriers from the patient's side.   Visit is to build trust and highlight Palliative Medicine as specialized medical care for people living with serious illness, aimed at facilitating better quality of life through symptoms relief, assisting with advance care planning and complex medical decision making. This is a follow up visit.  RECOMMENDATIONS/PLAN:   Advance Care Planning/Code Status:Patient is a Do Not Resuscitate  Goals of Care: Goals of care include to maximize quality of life and symptom management.  Visit consisted of counseling and education dealing with the complex and emotionally intense issues of symptom management and palliative care in the setting of serious and potentially life-threatening illness. Palliative care team will continue to support patient, patient's family, and medical team.  Symptom management/Plan:  COPD: No exacerbation in 2 months. Continue prednisone, oxygen supplementation, flutter valve therapy and breathing treatments as ordered-albuterol, Symbicort and Spiriva. Encouraged slow deep breathing, use  of fan for air circulation, to avoid irritants and triggers.  Followed by pulmonologist.  Weakness: chronic. Encouraged to optimize physical activities as tolerated, rests in between activities. Encouraged well balanced diet.  Patient has follow up visit with PCP next month. Routine CBC BMP recommended.   Patient continues to chew tobacco. Discussed tobacco chewing cessation. Alcohol dependence - he reports no  alcohol in 3 months. Education on benefits reiterated. Type 2 diabetes mellitus: He continues with metformin with no report of adverse effects.  Last A1c 6.5 06/26/2020. No concentrated sweets.  Hypertension continues to be managed with metoprolol and losartan. Follow up: Palliative care will continue to follow for complex medical decision making, advance care planning, and clarification of goals.  Inpatient visit in 4 weeks or prn. Encouraged to call provider sooner with any concerns.   CHIEF COMPLAINT: Palliative follow up   HISTORY OF PRESENT ILLNESS:  James [name] a 60 y.o. male with multiple medical problems including COPD currently managed with oxygen, prednisone and breathing treatment; no exacerbation in 2 months. He reports ongoing weakness; he is still able to go out of house and run some errands. He denies shortness of breath, pain or discomfort.  History of hypertension, type 2 diabetes mellitus, tobacco use, alcohol use.  History obtained from review of EMR, discussion with primary team, family and/or patient. Records reviewed and summarized above. All 10 point systems reviewed and are negative except as documented in history of present illness above  Review and summarization of Epic records shows history from other than patient.   Palliative Care was asked to follow this patient o help address complex decision making in the context of advance care planning and goals  of care clarification.  I reviewed patient records, labs, notes, testing and imaging myself as needed and where  available.   PERTINENT MEDICATIONS:  Outpatient Encounter Medications as of 11/10/2020  Medication Sig   albuterol (PROAIR HFA) 108 (90 Base) MCG/ACT inhaler Inhale 2 puffs into the lungs every 4 (four) hours as needed.   albuterol (PROVENTIL) (2.5 MG/3ML) 0.083% nebulizer solution TAKE 3 MLS (2.5 MG TOTAL) BY NEBULIZATION IN THE MORNING, AT NOON, AND AT BEDTIME.   DULoxetine (CYMBALTA) 60 MG capsule Take 1 capsule (60 mg total) by mouth daily.   Fluticasone-Umeclidin-Vilant (TRELEGY ELLIPTA) 100-62.5-25 MCG/ACT AEPB Inhale 1 puff into the lungs daily.   furosemide (LASIX) 20 MG tablet Take 1 tablet (20 mg total) by mouth daily as needed for edema.   guaiFENesin (MUCINEX) 600 MG 12 hr tablet Take 1 tablet (600 mg total) by mouth 2 (two) times daily as needed for cough or to loosen phlegm.   losartan (COZAAR) 50 MG tablet Take 1 tablet (50 mg total) by mouth daily.   metoprolol succinate (TOPROL-XL) 25 MG 24 hr tablet Take 1 tablet (25 mg total) by mouth daily.   pantoprazole (PROTONIX) 40 MG tablet Take 1 tablet (40 mg total) by mouth daily.   predniSONE (DELTASONE) 10 MG tablet Take 1 tablet (10 mg total) by mouth daily with breakfast.   Respiratory Therapy Supplies (FLUTTER) DEVI Use 4 times daily   thiamine 100 MG tablet Take 1 tablet (100 mg total) by mouth daily.   No facility-administered encounter medications on file as of 11/10/2020.    HOSPICE ELIGIBILITY/DIAGNOSIS: TBD  PAST MEDICAL HISTORY:  Past Medical History:  Diagnosis Date   Acute on chronic respiratory failure with hypoxia ([place]) 06/08/2013   CAP (community acquired pneumonia) 05/17/2018   See admit 05/17/18 ? rml  with   covid pcr neg - rx augmentin > f/u cxr in 4-6 weeks is fine unless condition declines    Community acquired pneumonia of right lower lobe of lung 05/17/2018   See admit 05/17/18 ? 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 More than 50% of the time in this consultation was spent on counseling and coordinating communication   Thank you for the opportunity to participate in the care of James [name] Please call our office at [phone] if we can be of additional assistance.  Note: Portions of this note were generated with Lobbyist. Dictation errors may occur despite best attempts at proofreading.  Teodoro Spray, NP

## 2020-11-13 ENCOUNTER — Other Ambulatory Visit: Payer: Self-pay | Admitting: *Deleted

## 2020-11-13 ENCOUNTER — Other Ambulatory Visit: Payer: Self-pay

## 2020-11-13 NOTE — Patient Instructions (Signed)
Visit Information  James [name] was given information about Medicaid Managed Care team care coordination services as a part of their Healthy [facility] Medicaid benefit. James [name] verbally consented to engagement with the [facility] Managed Care team.   If you are experiencing a medical emergency, please call 911 or report to your local emergency department or urgent care.   If you have a non-emergency medical problem during routine business hours, please contact your provider's office and ask to speak with a nurse.   For questions related to your Healthy [facility] health plan, please call: [phone] or visit the homepage here: [email or web address]  If you would like to schedule transportation through your Healthy [facility] plan, please call the following number at least 2 days in advance of your appointment: [phone]  Call the [place] at [phone], at any time, 24 hours a day, 7 days a week. If you are in danger or need immediate medical attention call 911.  If you would like help to quit smoking, call 1-800-QUIT-NOW 508 611 2556) OR Espaol: 1-855-Djelo-Ya (7-482-707-8675) o para ms informacin haga clic aqu or Text READY to 200-400 to register via text  James [name] - following are the goals we discussed in your visit today:   Goals Addressed             This Visit's Progress    COMPLETED: Manage Fatigue (Tiredness-COPD)       Resolving due to duplicate goal  Follow Up Date 11/13/20   - develop a new routine to improve sleep - get at least 7 to 8 hours of sleep at night - keep room cool and dark - practice relaxation or meditation daily    Why is this important?   Feeling tired or worn out is a common symptom of COPD (chronic obstructive pulmonary disease).  Learning when you feel your best and when you need rest is important.  Managing the tiredness (fatigue) will help you be active and enjoy life.           COMPLETED: Track and Manage My Triggers-COPD       Resolving due to duplicate goal  Timeframe:  Long-Range Goal Priority:  High Start Date:  04/09/20                           Expected End Date:   11/13/20                    Follow Up Date 11/13/20  - take all prescribed medication as directed-Pick up trelegy at [facility]-requested 11/06/20 - attend next appointment with James [name] on 12/19-take all of your medications with you to this appointment  - avoid outdoors during the warmest part of the day - avoid second hand smoke - identify and remove indoor air pollutants - limit outdoor activity during cold weather - listen for public air quality announcements every day  - call PCP with new concerns or questions    Why is this important?   Triggers are activities or things, like tobacco smoke or cold weather, that make your COPD (chronic obstructive pulmonary disease) flare-up.  Knowing these triggers helps you plan how to stay away from them.  When you cannot remove them, you can learn how to manage them.             Please see education materials related to COPD provided as print materials.   The patient verbalized  understanding of instructions provided today and agreed to receive a mailed copy of patient instruction and/or educational materials.  Telephone follow up appointment with Managed Medicaid care management team member scheduled for:11/25/20   [name] RN, BSN [place] RN Care Coordinator   Following is a copy of your plan of care:  Care Plan : COPD (Adult)  Updates made by [name], RN since 11/13/2020 12:00 AM  Completed 11/13/2020   Problem: Symptom Exacerbation (COPD) Resolved 11/13/2020  Note:   Resolving due to duplicate goal     Long-Range Goal: Symptom Exacerbation Prevented or Minimized Completed 11/13/2020  Start Date: 04/09/2020  Expected End Date: 11/06/2020  Recent  Progress: On track  Priority: High  Note:   Current Barriers:  Knowledge deficits related to basic COPD self care/management-James [name] was recently admitted for COPD exacerbation and pnuemonia. He reports taking his medications, wearing oxygen continuously, and has a follow up with his PCP on 04/14/20. He does report having difficulty at times swallowing food and liquid, pain in back and legs, and difficulty falling asleep. He is planning to discuss these issues with his PCP. James [name] reports having transportation and plenty of food. He lives alone, and has support from his sister. He was working outside at his home today and reports doing "about the same". He reports increased difficulty breathing when it is hot outside. He is working with [facility] pharmacist for medication management. He has not been drinking alcohol and reports feeling better since quitting.  James [name] recently hospitalized for COPD exacerbation, discharged home 8/11.  He using 2L of oxygen continuous and resting. He reports improvement with breathing after using new inhaler Anoro. James [name] received the correct connector. He reports improvement with breathing since using new inhaler trelegy. James [name] reports not tolerating Metformin XR, having nausea and feeling bad. He prefers to take metformin twice daily. His food stamps have stopped and he is concerned about affording food. He is not taking metformin and will discuss with James [name]. He continues to have concern regarding not receiving food stamps. Metformin was discontinued due to side effects and better glycemic control since quitting drinking alcohol. He received a new oxygen concentrator during this visit. James [name] is having difficulty affording food. He has not heard anything from referral placed in August for assistance with food stamp application. He continues to have a difficulty with breathing, especially when it is hot. James [name] had to quit working due to his health and  needs financial assistance.-Update-James [name] reports not feeling good this morning, denies swelling, has not checked oxygen saturation. Not using trelegy-needs refill. Care Guide has mailed a list of food pantries. Resolving due to duplicate goal  Case Manager Clinical Goal(s): patient will report using inhalers as prescribed including rinsing mouth after use patient will engage in lite exercise as tolerated to build/regain stamina and strength and reduce shortness of breath through activity tolerance patient will verbalize basic understanding of COPD disease process and self care activities patient will not be hospitalized for COPD exacerbation as evidenced  Patient will work with [facility] pharmacist for medication management-Met Patient will attend all scheduled appointments-Met Interventions:  Inter-disciplinary care team collaboration (see longitudinal plan of care)  Discussed future appointment with James [name] 12/22/20 Requested refill for trelegy, advised patient to pick medication up today and start using Reviewed medications and discussed the importance of requesting medications before running out  Advised patient to contact PCP with concerns or questions RNCM  will follow closely Discussed list of food pantries provided by Care Guide, advised of location near patient's home Provided information on using inhaler and nebulizer Advised patient to check his oxygen level and notify provider if less than 90  Patient Goals/Self-Care Activities:  - take all prescribed medication as directed-- take all prescribed medication as directed-Pick up trelegy at [facility] pharmacy-requested 11/06/20 - attend next appointment with James [name] on 12/19-take all of your medications with you to this appointment  - avoid outdoors during the warmest part of the day - avoid second hand smoke - identify and remove indoor air pollutants - limit outdoor activity during cold weather - listen for  public air quality announcements every day  - call PCP with new concerns or questions Follow Up Plan: Telephone follow up appointment with care management team member scheduled for:11/13/20 @ 12:45pm     Care Plan : [place] of Care  Updates made by [name], RN since 11/13/2020 12:00 AM     Problem: Chronic Disease Management needs in patient with COPD   Priority: High     Long-Range Goal: Plan of care established for for Chronic Disease management for patient with COPD   Start Date: 11/13/2020  Expected End Date: 01/12/2021  Priority: High  Note:   Current Barriers:  Care Coordination needs related to Limited access to food  Chronic Disease Management support and education needs related to COPD  RNCM Clinical Goal(s):  Patient will verbalize understanding of plan for management of COPD as evidenced by taking all medications as prescribed, attending all scheduled appointments and contacting provider with questions and concerns take all medications exactly as prescribed and will call provider for medication related questions as evidenced by refilling medications and taking as prescribed    attend all scheduled medical appointments: 12/22/20 @ 10am with Dr. Melvyn Novas as evidenced by provider notes in EMR12/        demonstrate improved adherence to prescribed treatment plan for COPD as evidenced by taking all medications as prescribed, attending all scheduled appointments and contacting provider with questions and concerns continue to work with RN Care Manager and/or Social Worker to address care management and care coordination needs related to COPD as evidenced by adherence to CM Team Scheduled appointments     through collaboration with RN Care manager, provider, and care team.   Interventions: Inter-disciplinary care team collaboration (see longitudinal plan of care) Evaluation of current treatment plan related to  self management and patient's adherence to plan as  established by provider   SDOH Barriers (Status:  New goal. Patient interviewed and SDOH assessment performed        SDOH Interventions    Flowsheet Row Most Recent Value  SDOH Interventions   Food Insecurity Interventions Other (Comment)  [Referral has been placed for Care Guide. A list of local food pantries has been provided]  Social Connections Interventions Intervention Not Indicated     Patient interviewed and appropriate assessments performed Referred patient to community resources care guide team for assistance with food resources and food stamp application Advised patient to ask his sister to help with the food stamp application Advised patient to answer incoming calls, Care Guide will be calling to follow up with assistance   COPD: (Status: New goal.) Long Term Goal  Reviewed medications with patient, including use of prescribed maintenance and rescue inhalers, and provided instruction on medication management and the importance of adherence Provided patient with basic written and verbal  COPD education on self care/management/and exacerbation prevention Advised patient to track and manage COPD triggers Provided instruction about proper use of medications used for management of COPD including inhalers Discussed the importance of adequate rest and management of fatigue with COPD Assessed social determinant of health barriers Advised patient to pick up refill for trelegy, verified that he has transportation   Patient Goals/Self-Care Activities: - avoid second hand smoke - limit outdoor activity during cold weather - keep follow-up appointments: 12/22/20 @ 10am with Dr. Melvyn Novas - eat healthy/prescribed diet: DASH - get at least 7 to 8 hours of sleep at night

## 2020-11-13 NOTE — Patient Outreach (Signed)
Medicaid Managed Care   Nurse Care Manager Note  11/13/2020 Name:  James [name] MRN:  [id number] DOB:  06/25/1960  James [name] is an 60 y.o. year old male who is a primary patient of [name], MD.  The [facility] Managed Care Coordination team was consulted for assistance with:    COPD  James [name] was given information about Medicaid Managed Care Coordination team services today. [name] Patient agreed to services and verbal consent obtained.  Engaged with patient by telephone for follow up visit in response to provider referral for case management and/or care coordination services.   Assessments/Interventions:  Review of past medical history, allergies, medications, health status, including review of consultants reports, laboratory and other test data, was performed as part of comprehensive evaluation and provision of chronic care management services.  SDOH (Social Determinants of Health) assessments and interventions performed: SDOH Interventions    Flowsheet Row Most Recent Value  SDOH Interventions   Food Insecurity Interventions Other (Comment)  [Referral has been placed for Care Guide. A list of local food pantries has been provided]  Social Connections Interventions Intervention Not Indicated       Care Plan  Allergies  Allergen Reactions   Gabapentin Other (See Comments)    hallucinations    Medications Reviewed Today     Reviewed by [name], RN (Registered Nurse) on 11/13/20 at [place] List Status: <None>   Medication Order Taking? Sig Documenting Provider Last Dose Status Informant  albuterol (PROAIR HFA) 108 (90 Base) MCG/ACT inhaler [id number] Yes Inhale 2 puffs into the lungs every 4 (four) hours as needed. [name], MD Taking Active   albuterol (PROVENTIL) (2.5 MG/3ML) 0.083% nebulizer solution [id number] Yes TAKE 3 MLS (2.5 MG TOTAL) BY NEBULIZATION IN THE MORNING, AT NOON, AND AT BEDTIME. [name], MD Taking Active             Med Note (Laria Grimmett A   Thu Nov 13, 2020 10:09 AM) Only using at night  DULoxetine (CYMBALTA) 60 MG capsule [id number] Yes Take 1 capsule (60 mg total) by mouth daily. [name], MD Taking Active   Fluticasone-Umeclidin-[facility] ELLIPTA) 100-62.5-25 MCG/ACT AEPB [id number] No Inhale 1 puff into the lungs daily.  Patient not taking: Reported on 11/13/2020   [name], MD Not Taking Active            Med Note (Sheila Gervasi A   Thu Nov 13, 2020 10:13 AM) Needs to pick up prescription  furosemide (LASIX) 20 MG tablet [id number] Yes Take 1 tablet (20 mg total) by mouth daily as needed for edema. [name], MD Taking Active   guaiFENesin (MUCINEX) 600 MG 12 hr tablet [id number] Yes Take 1 tablet (600 mg total) by mouth 2 (two) times daily as needed for cough or to loosen phlegm. [name], MD Taking Active   losartan (COZAAR) 50 MG tablet [id number] Yes Take 1 tablet (50 mg total) by mouth daily. [name], MD Taking Active   metoprolol succinate (TOPROL-XL) 25 MG 24 hr tablet [id number] Yes Take 1 tablet (25 mg total) by mouth daily. [name], MD Taking Active   pantoprazole (PROTONIX) 40 MG tablet [id number] Yes Take 1 tablet (40 mg total) by mouth daily. [name], MD Taking Active   predniSONE (DELTASONE) 10 MG tablet [id number] Yes Take 1 tablet (10 mg total) by mouth daily with breakfast. [name], MD Taking Active  Respiratory Therapy Supplies (FLUTTER) DEVI [id number] Yes Use 4 times daily [name], MD Taking Active Self  thiamine 100 MG tablet [id number] Yes Take 1 tablet (100 mg total) by mouth daily. [name], MD Taking Active             Patient Active Problem List   Diagnosis Date Noted   Neuropathy 09/29/2020   Obesity (BMI 30.0-34.9) 04/19/2020   Bronchiectasis ([place]) 09/24/2019   Type 2 diabetes mellitus without complication, without long-term current use of insulin ([place]) 07/24/2019   Other  atopic dermatitis 05/01/2019   Chronic respiratory failure with hypoxia ([place]) 04/23/2019   Intellectual disability 04/23/2019   History of alcohol use 11/23/2018   Hypertension 05/17/2016   History of tobacco abuse 04/06/2016   COPD mixed type ([place]) 03/26/2016    Conditions to be addressed/monitored per PCP order:  COPD  Care Plan : COPD (Adult)  Updates made by [name], RN since 11/13/2020 12:00 AM  Completed 11/13/2020   Problem: Symptom Exacerbation (COPD) Resolved 11/13/2020  Note:   Resolving due to duplicate goal     Long-Range Goal: Symptom Exacerbation Prevented or Minimized Completed 11/13/2020  Start Date: 04/09/2020  Expected End Date: 11/06/2020  Recent Progress: On track  Priority: High  Note:   Current Barriers:  Knowledge deficits related to basic COPD self care/management-James [name] was recently admitted for COPD exacerbation and pnuemonia. He reports taking his medications, wearing oxygen continuously, and has a follow up with his PCP on 04/14/20. He does report having difficulty at times swallowing food and liquid, pain in back and legs, and difficulty falling asleep. He is planning to discuss these issues with his PCP. James [name] reports having transportation and plenty of food. He lives alone, and has support from his sister. He was working outside at his home today and reports doing "about the same". He reports increased difficulty breathing when it is hot outside. He is working with [facility] pharmacist for medication management. He has not been drinking alcohol and reports feeling better since quitting.  James [name] recently hospitalized for COPD exacerbation, discharged home 8/11.  He using 2L of oxygen continuous and resting. He reports improvement with breathing after using new inhaler Anoro. James [name] received the correct connector. He reports improvement with breathing since using new inhaler trelegy. James [name] reports not tolerating Metformin XR, having nausea  and feeling bad. He prefers to take metformin twice daily. His food stamps have stopped and he is concerned about affording food. He is not taking metformin and will discuss with [name]. He continues to have concern regarding not receiving food stamps. Metformin was discontinued due to side effects and better glycemic control since quitting drinking alcohol. He received a new oxygen concentrator during this visit. James [name] is having difficulty affording food. He has not heard anything from referral placed in August for assistance with food stamp application. He continues to have a difficulty with breathing, especially when it is hot. James [name] had to quit working due to his health and needs financial assistance.-Update-James [name] reports not feeling good this morning, denies swelling, has not checked oxygen saturation. Not using trelegy-needs refill. Care Guide has mailed a list of food pantries. Resolving due to duplicate goal  Case Manager Clinical Goal(s): patient will report using inhalers as prescribed including rinsing mouth after use patient will engage in lite exercise as tolerated to build/regain stamina and strength and reduce shortness of breath through activity tolerance patient will verbalize basic  understanding of COPD disease process and self care activities patient will not be hospitalized for COPD exacerbation as evidenced  Patient will work with [facility] pharmacist for medication management-Met Patient will attend all scheduled appointments-Met Interventions:  Inter-disciplinary care team collaboration (see longitudinal plan of care)  Discussed future appointment with [name] 12/22/20 Requested refill for trelegy, advised patient to pick medication up today and start using Reviewed medications and discussed the importance of requesting medications before running out  Advised patient to contact PCP with concerns or questions RNCM will follow closely Discussed list of food  pantries provided by Care Guide, advised of location near patient's home Provided information on using inhaler and nebulizer Advised patient to check his oxygen level and notify provider if less than 90  Patient Goals/Self-Care Activities:  - take all prescribed medication as directed-- take all prescribed medication as directed-Pick up trelegy at [facility] pharmacy-requested 11/06/20 - attend next appointment with [name] on 12/19-take all of your medications with you to this appointment  - avoid outdoors during the warmest part of the day - avoid second hand smoke - identify and remove indoor air pollutants - limit outdoor activity during cold weather - listen for public air quality announcements every day  - call PCP with new concerns or questions Follow Up Plan: Telephone follow up appointment with care management team member scheduled for:11/13/20 @ 12:45pm     Care Plan : [place] of Care  Updates made by [name], RN since 11/13/2020 12:00 AM     Problem: Chronic Disease Management needs in patient with COPD   Priority: High     Long-Range Goal: Plan of care established for for Chronic Disease management for patient with COPD   Start Date: 11/13/2020  Expected End Date: 01/12/2021  Priority: High  Note:   Current Barriers:  Care Coordination needs related to Limited access to food  Chronic Disease Management support and education needs related to COPD  RNCM Clinical Goal(s):  Patient will verbalize understanding of plan for management of COPD as evidenced by taking all medications as prescribed, attending all scheduled appointments and contacting provider with questions and concerns take all medications exactly as prescribed and will call provider for medication related questions as evidenced by refilling medications and taking as prescribed    attend all scheduled medical appointments: 12/22/20 @ 10am with Dr. Melvyn Novas as evidenced by  provider notes in EMR12/        demonstrate improved adherence to prescribed treatment plan for COPD as evidenced by taking all medications as prescribed, attending all scheduled appointments and contacting provider with questions and concerns continue to work with RN Care Manager and/or Social Worker to address care management and care coordination needs related to COPD as evidenced by adherence to CM Team Scheduled appointments     through collaboration with RN Care manager, provider, and care team.   Interventions: Inter-disciplinary care team collaboration (see longitudinal plan of care) Evaluation of current treatment plan related to  self management and patient's adherence to plan as established by provider   SDOH Barriers (Status:  New goal. Patient interviewed and SDOH assessment performed        SDOH Interventions    Flowsheet Row Most Recent Value  SDOH Interventions   Food Insecurity Interventions Other (Comment)  [Referral has been placed for Care Guide. A list of local food pantries has been provided]  Social Connections Interventions Intervention Not Indicated     Patient  interviewed and appropriate assessments performed Referred patient to community resources care guide team for assistance with food resources and food stamp application Advised patient to ask his sister to help with the food stamp application Advised patient to answer incoming calls, Care Guide will be calling to follow up with assistance   COPD: (Status: New goal.) Long Term Goal  Reviewed medications with patient, including use of prescribed maintenance and rescue inhalers, and provided instruction on medication management and the importance of adherence Provided patient with basic written and verbal COPD education on self care/management/and exacerbation prevention Advised patient to track and manage COPD triggers Provided instruction about proper use of medications used for management of COPD including  inhalers Discussed the importance of adequate rest and management of fatigue with COPD Assessed social determinant of health barriers Advised patient to pick up refill for trelegy, verified that he has transportation   Patient Goals/Self-Care Activities: - avoid second hand smoke - limit outdoor activity during cold weather - keep follow-up appointments: 12/22/20 @ 10am with Dr. Melvyn Novas - eat healthy/prescribed diet: DASH - get at least 7 to 8 hours of sleep at night       Follow Up:  Patient agrees to Care Plan and Follow-up.  Plan: The Managed Medicaid care management team will reach out to the patient again over the next 14 days.  Date/time of next scheduled RN care management/care coordination outreach:  11/25/20 @ 10:30am  [name] RN, BSN [place] RN Care Coordinator

## 2020-11-19 ENCOUNTER — Telehealth: Payer: Self-pay

## 2020-11-19 NOTE — Telephone Encounter (Signed)
   Telephone encounter was:  Successful.  11/19/2020 Name: EDIE DARLEY MRN: [id number] DOB: [birth year]  SEYON STRADER is a 60 y.o. year old male who is a primary care patient of [name], MD . The community resource team was consulted for assistance with [place] guide performed the following interventions: Spoke with patient's sister she has not checked his mail but will check it this week. She has my name and contact number to call.   Follow Up Plan:  No further follow up planned at this time. The patient has been provided with needed resources.  Winna Golla, AAS Paralegal, [place] [employer]  [address], [place] [address] ??[email or web address]  ?? [phone]   [email or web address]

## 2020-11-21 ENCOUNTER — Other Ambulatory Visit: Payer: Self-pay

## 2020-11-21 ENCOUNTER — Other Ambulatory Visit: Payer: Medicaid Other | Admitting: *Deleted

## 2020-11-21 NOTE — Patient Outreach (Signed)
Medicaid Managed Care   Nurse Care Manager Note  11/21/2020 Name:  James [name] MRN:  [id number] DOB:  07-Feb-1960  James [name] is an 60 y.o. year old male who is a primary patient of [name], MD.  The [facility] Managed Care Coordination team was consulted for assistance with:    COPD Food insecurity  Mr. Sanon was given information about Medicaid Managed Care Coordination team services today. [name] Patient agreed to services and verbal consent obtained.  Engaged with patient by telephone for follow up visit in response to provider referral for case management and/or care coordination services.   Assessments/Interventions:  Review of past medical history, allergies, medications, health status, including review of consultants reports, laboratory and other test data, was performed as part of comprehensive evaluation and provision of chronic care management services.  SDOH (Social Determinants of Health) assessments and interventions performed: SDOH Interventions    Flowsheet Row Most Recent Value  SDOH Interventions   Food Insecurity Interventions Other (Comment)  [RNCM called Food and Nutrition for assistance with food benefits application. Patient should recieve a call within 24hrs.]       Care Plan  Allergies  Allergen Reactions   Gabapentin Other (See Comments)    hallucinations    Medications Reviewed Today     Reviewed by [name], RN (Registered Nurse) on 11/13/20 at [place] Status: <None>   Medication Order Taking? Sig Documenting Provider Last Dose Status Informant  albuterol (PROAIR HFA) 108 (90 Base) MCG/ACT inhaler [id number] Yes Inhale 2 puffs into the lungs every 4 (four) hours as needed. [name], MD Taking Active   albuterol (PROVENTIL) (2.5 MG/3ML) 0.083% nebulizer solution [id number] Yes TAKE 3 MLS (2.5 MG TOTAL) BY NEBULIZATION IN THE MORNING, AT NOON, AND AT BEDTIME. [name], MD Taking Active             Med Note (Joyceline Maiorino A   Thu Nov 13, 2020 10:09 AM) Only using at night  DULoxetine (CYMBALTA) 60 MG capsule [id number] Yes Take 1 capsule (60 mg total) by mouth daily. [name], MD Taking Active   Fluticasone-Umeclidin-Vilant [facility] ELLIPTA) 100-62.5-25 MCG/ACT AEPB [id number] No Inhale 1 puff into the lungs daily.  Patient not taking: Reported on 11/13/2020   [name], MD Not Taking Active            Med Note (Aundria Bitterman A   Thu Nov 13, 2020 10:13 AM) Needs to pick up prescription  furosemide (LASIX) 20 MG tablet [id number] Yes Take 1 tablet (20 mg total) by mouth daily as needed for edema. [name], MD Taking Active   guaiFENesin (MUCINEX) 600 MG 12 hr tablet [id number] Yes Take 1 tablet (600 mg total) by mouth 2 (two) times daily as needed for cough or to loosen phlegm. [name], MD Taking Active   losartan (COZAAR) 50 MG tablet [id number] Yes Take 1 tablet (50 mg total) by mouth daily. [name], MD Taking Active   metoprolol succinate (TOPROL-XL) 25 MG 24 hr tablet [id number] Yes Take 1 tablet (25 mg total) by mouth daily. [name], MD Taking Active   pantoprazole (PROTONIX) 40 MG tablet [id number] Yes Take 1 tablet (40 mg total) by mouth daily. [name], MD Taking Active   predniSONE (DELTASONE) 10 MG tablet [id number] Yes Take 1 tablet (10 mg total) by mouth daily with breakfast. [name], MD Taking Active   Respiratory Therapy  Supplies (FLUTTER) DEVI [id number] Yes Use 4 times daily [name], MD Taking Active Self  thiamine 100 MG tablet [id number] Yes Take 1 tablet (100 mg total) by mouth daily. [name], MD Taking Active             Patient Active Problem List   Diagnosis Date Noted   Neuropathy 09/29/2020   Obesity (BMI 30.0-34.9) 04/19/2020   Bronchiectasis ([place]) 09/24/2019   Type 2 diabetes mellitus without complication, without long-term current use of insulin ([place]) 07/24/2019   Other atopic  dermatitis 05/01/2019   Chronic respiratory failure with hypoxia ([place]) 04/23/2019   Intellectual disability 04/23/2019   History of alcohol use 11/23/2018   Hypertension 05/17/2016   History of tobacco abuse 04/06/2016   COPD mixed type ([place]) 03/26/2016    Conditions to be addressed/monitored per PCP order:  COPD and food insecurity  Care Plan : RN Care Manager Plan of Care  Updates made by [name], RN since 11/21/2020 12:00 AM     Problem: Chronic Disease Management needs in patient with COPD   Priority: High     Long-Range Goal: Plan of care established for for Chronic Disease management for patient with COPD   Start Date: 11/13/2020  Expected End Date: 01/12/2021  Priority: High  Note:   Current Barriers:  Care Coordination needs related to Limited access to food  Chronic Disease Management support and education needs related to COPD  RNCM Clinical Goal(s):  Patient will verbalize understanding of plan for management of COPD as evidenced by taking all medications as prescribed, attending all scheduled appointments and contacting provider with questions and concerns take all medications exactly as prescribed and will call provider for medication related questions as evidenced by refilling medications and taking as prescribed    attend all scheduled medical appointments: 12/22/20 @ 10am with Dr. Melvyn Novas as evidenced by provider notes in EMR        demonstrate improved adherence to prescribed treatment plan for COPD as evidenced by taking all medications as prescribed, attending all scheduled appointments and contacting provider with questions and concerns continue to work with RN Care Manager and/or Social Worker to address care management and care coordination needs related to COPD as evidenced by adherence to CM Team Scheduled appointments     through collaboration with RN Care manager, provider, and care team.   Interventions: Inter-disciplinary care team collaboration (see  longitudinal plan of care) Evaluation of current treatment plan related to  self management and patient's adherence to plan as established by provider   SDOH Barriers (Status:  Goal on track:  Yes Patient interviewed and SDOH assessment performed        SDOH Interventions    Flowsheet Row Most Recent Value  SDOH Interventions   Food Insecurity Interventions Other (Comment)  [RNCM called Food and Nutrition for assistance with food benefits application. Patient should recieve a call within 24hrs.]     Patient interviewed and appropriate assessments performed Advised patient to ask his sister to help with the food stamp application -Patient's sister has been unable to assist Advised patient to answer incoming calls, Care Guide will be calling to follow up with assistance RNCM contacted EBT Call Center to inquire about previous food benefits-Patient was due for Re-certification in July 2022. Patient unaware RNCM contacted [employer] and Nutrition to inquire about assistance with Food Stamp application for patient    COPD: (Status: Condition stable. Not addressed this visit.) Long Term Goal  Reviewed medications with  patient, including use of prescribed maintenance and rescue inhalers, and provided instruction on medication management and the importance of adherence Provided patient with basic written and verbal COPD education on self care/management/and exacerbation prevention Advised patient to track and manage COPD triggers Provided instruction about proper use of medications used for management of COPD including inhalers Discussed the importance of adequate rest and management of fatigue with COPD Assessed social determinant of health barriers Advised patient to pick up refill for trelegy, verified that he has transportation   Patient Goals/Self-Care Activities: - avoid second hand smoke - limit outdoor activity during cold weather - keep follow-up appointments: 12/22/20 @ 10am with Dr.  Melvyn Novas - eat healthy/prescribed diet: DASH - get at least 7 to 8 hours of sleep at night       Follow Up:  Patient agrees to Care Plan and Follow-up.  Plan: The Managed Medicaid care management team will reach out to the patient again over the next 3 days.  Date/time of next scheduled RN care management/care coordination outreach:  11/25/20 @ 10:30am  [name] RN, BSN [place] RN Care Coordinator

## 2020-11-21 NOTE — Patient Instructions (Signed)
Visit Information  James [name] was given information about Medicaid Managed Care team care coordination services as a part of their Healthy [facility] Medicaid benefit. James [name] verbally consented to engagement with the [facility] Managed Care team.   If you are experiencing a medical emergency, please call 911 or report to your local emergency department or urgent care.   If you have a non-emergency medical problem during routine business hours, please contact your provider's office and ask to speak with a nurse.   For questions related to your Healthy [facility] health plan, please call: [phone] or visit the homepage here: [email or web address]  If you would like to schedule transportation through your Healthy [facility] plan, please call the following number at least 2 days in advance of your appointment: [phone]  Call the [place] at [phone], at any time, 24 hours a day, 7 days a week. If you are in danger or need immediate medical attention call 911.  If you would like help to quit smoking, call 1-800-QUIT-NOW 479 833 5447) OR Espaol: 1-855-Djelo-Ya (5-638-756-4332) o para ms informacin haga clic aqu or Text READY to 200-400 to register via text  [name] - following are the goals we discussed in your visit today:   Goals Addressed   None        Telephone follow up appointment with Managed Medicaid care management team member scheduled for:11/25/20 @ 10:30am  James Joiner RN, BSN [place] RN Care Coordinator   Following is a copy of your plan of care:  Care Plan : [place] of Care  Updates made by [name], RN since 11/21/2020 12:00 AM     Problem: Chronic Disease Management needs in patient with COPD   Priority: High     Long-Range Goal: Plan of care established for for Chronic Disease management for patient with COPD   Start Date:  11/13/2020  Expected End Date: 01/12/2021  Priority: High  Note:   Current Barriers:  Care Coordination needs related to Limited access to food  Chronic Disease Management support and education needs related to COPD  RNCM Clinical Goal(s):  Patient will verbalize understanding of plan for management of COPD as evidenced by taking all medications as prescribed, attending all scheduled appointments and contacting provider with questions and concerns take all medications exactly as prescribed and will call provider for medication related questions as evidenced by refilling medications and taking as prescribed    attend all scheduled medical appointments: 12/22/20 @ 10am with Dr. Melvyn Novas as evidenced by provider notes in EMR        demonstrate improved adherence to prescribed treatment plan for COPD as evidenced by taking all medications as prescribed, attending all scheduled appointments and contacting provider with questions and concerns continue to work with RN Care Manager and/or Social Worker to address care management and care coordination needs related to COPD as evidenced by adherence to CM Team Scheduled appointments     through collaboration with RN Care manager, provider, and care team.   Interventions: Inter-disciplinary care team collaboration (see longitudinal plan of care) Evaluation of current treatment plan related to  self management and patient's adherence to plan as established by provider   SDOH Barriers (Status:  Goal on track:  Yes Patient interviewed and SDOH assessment performed        SDOH Interventions    Flowsheet Row Most Recent Value  SDOH Interventions   Food Insecurity Interventions Other (Comment)  James [name]  called Food and Nutrition for assistance with food benefits application. Patient should recieve a call within 24hrs.]     Patient interviewed and appropriate assessments performed Advised patient to ask his sister to help with the food stamp application  -Patient's sister has been unable to assist Advised patient to answer incoming calls, Care Guide will be calling to follow up with assistance RNCM contacted EBT Call Center to inquire about previous food benefits-Patient was due for Re-certification in July 2022. Patient unaware RNCM contacted Food and Nutrition to inquire about assistance with Food Stamp application for patient    COPD: (Status: Condition stable. Not addressed this visit.) Long Term Goal  Reviewed medications with patient, including use of prescribed maintenance and rescue inhalers, and provided instruction on medication management and the importance of adherence Provided patient with basic written and verbal COPD education on self care/management/and exacerbation prevention Advised patient to track and manage COPD triggers Provided instruction about proper use of medications used for management of COPD including inhalers Discussed the importance of adequate rest and management of fatigue with COPD Assessed social determinant of health barriers Advised patient to pick up refill for trelegy, verified that he has transportation   Patient Goals/Self-Care Activities: - avoid second hand smoke - limit outdoor activity during cold weather - keep follow-up appointments: 12/22/20 @ 10am with Dr. Melvyn Novas - eat healthy/prescribed diet: DASH - get at least 7 to 8 hours of sleep at night

## 2020-11-24 ENCOUNTER — Other Ambulatory Visit: Payer: Self-pay

## 2020-11-25 ENCOUNTER — Other Ambulatory Visit: Payer: Self-pay | Admitting: *Deleted

## 2020-11-25 ENCOUNTER — Other Ambulatory Visit: Payer: Self-pay

## 2020-11-25 NOTE — Patient Outreach (Signed)
Medicaid Managed Care   Nurse Care Manager Note  11/25/2020 Name:  James [name] MRN:  [id number] DOB:  04/10/1960  James [name] is an 60 y.o. year old male who is [name] primary patient of James Gaskins Burnett Harry, MD.  The [facility] Managed Care Coordination team was consulted for assistance with:    COPD  James [name] was given information about Medicaid Managed Care Coordination team services today. James [name] Patient agreed to services and verbal consent obtained.  Engaged with patient by telephone for follow up visit in response to provider referral for case management and/or care coordination services.   Assessments/Interventions:  Review of past medical history, allergies, medications, health status, including review of consultants reports, laboratory and other test data, was performed as part of comprehensive evaluation and provision of chronic care management services.  SDOH (Social Determinants of Health) assessments and interventions performed: SDOH Interventions    Flowsheet Row Most Recent Value  SDOH Interventions   Housing Interventions Intervention Not Indicated  Transportation Interventions Intervention Not Indicated       Care Plan  Allergies  Allergen Reactions   Gabapentin Other (See Comments)    hallucinations    Medications Reviewed Today     Reviewed by James Montane, RN (Registered Nurse) on 11/25/20 at 1128  Med List Status: <None>   Medication Order Taking? Sig Documenting Provider Last Dose Status Informant  albuterol (PROAIR HFA) 108 (90 Base) MCG/ACT inhaler [id number] Yes Inhale 2 puffs into the lungs every 4 (four) hours as needed. James Stain, MD Taking Active   albuterol (PROVENTIL) (2.5 MG/3ML) 0.083% nebulizer solution [id number] Yes TAKE 3 MLS (2.5 MG TOTAL) BY NEBULIZATION IN THE MORNING, AT NOON, AND AT BEDTIME. James Stain, MD Taking Active            Med Note (James [name]   Thu Nov 13, 2020 10:09 AM) Only using at night   DULoxetine (CYMBALTA) 60 MG capsule [id number] Yes Take 1 capsule (60 mg total) by mouth daily. James Stain, MD Taking Active   Fluticasone-Umeclidin-Vilant [facility] ELLIPTA) 100-62.5-25 MCG/ACT AEPB [id number] Yes Inhale 1 puff into the lungs daily. James Stain, MD Taking Active            Med Note James [name], James [name]   Tue Nov 25, 2020 11:27 AM)    furosemide (LASIX) 20 MG tablet [id number] Yes Take 1 tablet (20 mg total) by mouth daily as needed for edema. James Stain, MD Taking Active   guaiFENesin (MUCINEX) 600 MG 12 hr tablet [id number] Yes Take 1 tablet (600 mg total) by mouth 2 (two) times daily as needed for cough or to loosen phlegm. James Bi, MD Taking Active   losartan (COZAAR) 50 MG tablet [id number] Yes Take 1 tablet (50 mg total) by mouth daily. James Stain, MD Taking Active   metoprolol succinate (TOPROL-XL) 25 MG 24 hr tablet [id number] Yes Take 1 tablet (25 mg total) by mouth daily. James Stain, MD Taking Active   pantoprazole (PROTONIX) 40 MG tablet [id number] Yes Take 1 tablet (40 mg total) by mouth daily. James Stain, MD Taking Active   predniSONE (DELTASONE) 10 MG tablet [id number] Yes Take 1 tablet (10 mg total) by mouth daily with breakfast. James Stain, MD Taking Active   Respiratory Therapy Supplies (FLUTTER) DEVI [id number] Yes Use 4 times daily James Stain, MD Taking Active Self  thiamine 100 MG tablet [id number] Yes Take 1 tablet (100 mg  total) by mouth daily. James Stain, MD Taking Active             Patient Active Problem List   Diagnosis Date Noted   Neuropathy 09/29/2020   Obesity (BMI 30.0-34.9) 04/19/2020   Bronchiectasis ([place]) 09/24/2019   Type 2 diabetes mellitus without complication, without long-term current use of insulin ([place]) 07/24/2019   Other atopic dermatitis 05/01/2019   Chronic respiratory failure with hypoxia ([place]) 04/23/2019   Intellectual disability 04/23/2019   History of alcohol use 11/23/2018    Hypertension 05/17/2016   History of tobacco abuse 04/06/2016   COPD mixed type ([place]) 03/26/2016    Conditions to be addressed/monitored per PCP order:  COPD  Care Plan : [place] of Care  Updates made by James Montane, RN since 11/25/2020 12:00 AM     Problem: Chronic Disease Management needs in patient with COPD   Priority: High     Long-Range Goal: Plan of care established for for Chronic Disease management for patient with COPD   Start Date: 11/13/2020  Expected End Date: 01/12/2021  Priority: High  Note:   Current Barriers:  Care Coordination needs related to Limited access to food  Chronic Disease Management support and education needs related to COPD  RNCM Clinical Goal(s):  Patient will verbalize understanding of plan for management of COPD as evidenced by taking all medications as prescribed, attending all scheduled appointments and contacting provider with questions and concerns take all medications exactly as prescribed and will call provider for medication related questions as evidenced by refilling medications and taking as prescribed    attend all scheduled medical appointments: 12/22/20 @ 10am with Dr. Melvyn [name] as evidenced by provider notes in EMR        demonstrate improved adherence to prescribed treatment plan for COPD as evidenced by taking all medications as prescribed, attending all scheduled appointments and contacting provider with questions and concerns continue to work with RN Care Manager and/or Social Worker to address care management and care coordination needs related to COPD as evidenced by adherence to CM Team Scheduled appointments     through collaboration with RN Care manager, provider, and care team.   Interventions: Inter-disciplinary care team collaboration (see longitudinal plan of care) Evaluation of current treatment plan related to  self management and patient's adherence to plan as established by provider Provided therapeutic  listening-patient completed the food stamp application over the phone on 11/21/20. He will receive [name] letter re: food benefits in 7-24 business days   SDOH Barriers (Status:  Goal on track:  Yes Patient interviewed and SDOH assessment performed        SDOH Interventions    Flowsheet Row Most Recent Value  SDOH Interventions   Food Insecurity Interventions Other (Comment)  [RNCM called Food and Nutrition for assistance with food benefits application. Patient should recieve [name] call within 24hrs.]     Patient interviewed and appropriate assessments performed Discussed plans with patient for ongoing care management follow up and provided patient with direct contact information for care management team-Patient's sister has been unable to assist    COPD: (Status: Goal on Track (progressing): YES.) Long Term Goal  Reviewed medications with patient, including use of prescribed maintenance and rescue inhalers, and provided instruction on medication management and the importance of adherence Provided patient with basic written and verbal COPD education on self care/management/and exacerbation prevention Advised patient to track and manage COPD triggers Advised patient to engage in light exercise as tolerated  3-5 days [name] week to aid in the the management of COPD Discussed the importance of adequate rest and management of fatigue with COPD Assessed social determinant of health barriers    Patient Goals/Self-Care Activities: Take medications as prescribed   Attend all scheduled provider appointments Call pharmacy for medication refills 3-7 days in advance of running out of medications Perform all self care activities independently  Call provider office for new concerns or questions  - avoid second hand smoke - limit outdoor activity during cold weather - keep follow-up appointments: 12/22/20 @ 10am with Dr. Melvyn [name] - eat healthy/prescribed diet: DASH - get at least 7 to 8 hours of sleep at  night       Follow Up:  Patient agrees to Care Plan and Follow-up.  Plan: The Managed Medicaid care management team will reach out to the patient again over the next 14 days.  Date/time of next scheduled RN care management/care coordination outreach:  12/11/20 @ 10:30am  [name] RN, BSN [place] RN Care Coordinator

## 2020-11-25 NOTE — Patient Instructions (Signed)
Visit Information  James [name] was given information about Medicaid Managed Care team care coordination services as a part of their Healthy [facility] Medicaid benefit. James [name] verbally consented to engagement with the [facility] Managed Care team.   If you are experiencing a medical emergency, please call 911 or report to your local emergency department or urgent care.   If you have a non-emergency medical problem during routine business hours, please contact your provider's office and ask to speak with a nurse.   For questions related to your Healthy [facility] health plan, please call: [phone] or visit the homepage here: [email or web address]  If you would like to schedule transportation through your Healthy [facility] plan, please call the following number at least 2 days in advance of your appointment: [phone]  Call the [facility] at [phone], at any time, 24 hours a day, 7 days a week. If you are in danger or need immediate medical attention call 911.  If you would like help to quit smoking, call 1-800-QUIT-NOW 623-531-7425) OR Espaol: 1-855-Djelo-Ya (7-510-258-5277) o para ms informacin haga clic aqu or Text READY to 200-400 to register via text  James [name] - following are the goals we discussed in your visit today:   Goals Addressed   None     Please see education materials related to COPD provided as print materials.   The patient verbalized understanding of instructions provided today and agreed to receive a mailed copy of patient instruction and/or educational materials.  Telephone follow up appointment with Managed Medicaid care management team member scheduled for:  SIG12/8/22 @ 10:30am  Following is a copy of your plan of care:  Care Plan : [place] of Care  Updates made by [name], RN since 11/25/2020 12:00 AM     Problem: Chronic Disease Management needs in patient  with COPD   Priority: High     Long-Range Goal: Plan of care established for for Chronic Disease management for patient with COPD   Start Date: 11/13/2020  Expected End Date: 01/12/2021  Priority: High  Note:   Current Barriers:  Care Coordination needs related to Limited access to food  Chronic Disease Management support and education needs related to COPD  RNCM Clinical Goal(s):  Patient will verbalize understanding of plan for management of COPD as evidenced by taking all medications as prescribed, attending all scheduled appointments and contacting provider with questions and concerns take all medications exactly as prescribed and will call provider for medication related questions as evidenced by refilling medications and taking as prescribed    attend all scheduled medical appointments: 12/22/20 @ 10am with Dr. Melvyn Novas as evidenced by provider notes in EMR        demonstrate improved adherence to prescribed treatment plan for COPD as evidenced by taking all medications as prescribed, attending all scheduled appointments and contacting provider with questions and concerns continue to work with RN Care Manager and/or Social Worker to address care management and care coordination needs related to COPD as evidenced by adherence to CM Team Scheduled appointments     through collaboration with RN Care manager, provider, and care team.   Interventions: Inter-disciplinary care team collaboration (see longitudinal plan of care) Evaluation of current treatment plan related to  self management and patient's adherence to plan as established by provider Provided therapeutic listening-patient completed the food stamp application over the phone on 11/21/20. He will receive a letter re: food benefits in 7-24 business days  SDOH Barriers (Status:  Goal on track:  Yes Patient interviewed and SDOH assessment performed        SDOH Interventions    Flowsheet Row Most Recent Value  SDOH Interventions    Food Insecurity Interventions Other (Comment)  [RNCM called Food and Nutrition for assistance with food benefits application. Patient should recieve a call within 24hrs.]     Patient interviewed and appropriate assessments performed Discussed plans with patient for ongoing care management follow up and provided patient with direct contact information for care management team-Patient's sister has been unable to assist    COPD: (Status: Goal on Track (progressing): YES.) Long Term Goal  Reviewed medications with patient, including use of prescribed maintenance and rescue inhalers, and provided instruction on medication management and the importance of adherence Provided patient with basic written and verbal COPD education on self care/management/and exacerbation prevention Advised patient to track and manage COPD triggers Advised patient to engage in light exercise as tolerated 3-5 days a week to aid in the the management of COPD Discussed the importance of adequate rest and management of fatigue with COPD Assessed social determinant of health barriers    Patient Goals/Self-Care Activities: Take medications as prescribed   Attend all scheduled provider appointments Call pharmacy for medication refills 3-7 days in advance of running out of medications Perform all self care activities independently  Call provider office for new concerns or questions  - avoid second hand smoke - limit outdoor activity during cold weather - keep follow-up appointments: 12/22/20 @ 10am with Dr. Melvyn Novas - eat healthy/prescribed diet: DASH - get at least 7 to 8 hours of sleep at night

## 2020-12-11 ENCOUNTER — Other Ambulatory Visit: Payer: Self-pay | Admitting: *Deleted

## 2020-12-11 ENCOUNTER — Other Ambulatory Visit: Payer: Self-pay

## 2020-12-11 NOTE — Patient Instructions (Signed)
Visit Information  James [name] was given information about Medicaid Managed Care team care coordination services as a part of their Healthy [facility] Medicaid benefit. James [name] verbally consented to engagement with the [facility] Managed Care team.   If you are experiencing a medical emergency, please call 911 or report to your local emergency department or urgent care.   If you have a non-emergency medical problem during routine business hours, please contact your provider's office and ask to speak with a nurse.   For questions related to your Healthy [facility] health plan, please call: [phone] or visit the homepage here: [email or web address]  If you would like to schedule transportation through your Healthy [facility] plan, please call the following number at least 2 days in advance of your appointment: [phone]  Call the [facility] at [phone], at any time, 24 hours a day, 7 days a week. If you are in danger or need immediate medical attention call 911.  If you would like help to quit smoking, call 1-800-QUIT-NOW 218-861-7142) OR Espaol: 1-855-Djelo-Ya (3-903-009-2330) o para ms informacin haga clic aqu or Text READY to 200-400 to register via text  James [name] - following are the goals we discussed in your visit today:   Goals Addressed   None     Please see education materials related to COPD provided as print materials.   The patient verbalized understanding of instructions provided today and agreed to receive a mailed copy of patient instruction and/or educational materials.  Telephone follow up appointment with Managed Medicaid care management team member scheduled for:12/25/20 @ 10:30am  [name] RN, BSN [place] RN Care Coordinator   Following is a copy of your plan of care:  Care Plan : RN Care Manager Plan of Care  Updates made by [name], RN since  12/11/2020 12:00 AM     Problem: Chronic Disease Management needs in patient with COPD   Priority: High     Long-Range Goal: Plan of care established for for Chronic Disease management for patient with COPD   Start Date: 11/13/2020  Expected End Date: 01/12/2021  Priority: High  Note:   Current Barriers:  Care Coordination needs related to Limited access to food  Chronic Disease Management support and education needs related to COPD James [name] has started receiving food stamps, which is very helpful. He has not been feeling well this week and would like antibiotics. He would like to discuss this at his next visit with [name]. He reports having and taking all of his medications. Only using Nebulizer at bedtime.   RNCM Clinical Goal(s):  Patient will verbalize understanding of plan for management of COPD as evidenced by taking all medications as prescribed, attending all scheduled appointments and contacting provider with questions and concerns take all medications exactly as prescribed and will call provider for medication related questions as evidenced by refilling medications and taking as prescribed    attend all scheduled medical appointments: 12/22/20 @ 10am with [name] as evidenced by provider notes in EMR        demonstrate improved adherence to prescribed treatment plan for COPD as evidenced by taking all medications as prescribed, attending all scheduled appointments and contacting provider with questions and concerns continue to work with RN Care Manager and/or Social Worker to address care management and care coordination needs related to COPD as evidenced by adherence to CM Team Scheduled appointments     through collaboration with RN  Care manager, provider, and care team.   Interventions: Inter-disciplinary care team collaboration (see longitudinal plan of care) Evaluation of current treatment plan related to  self management and patient's adherence to plan as established  by provider Provided therapeutic listening-patient now receiving food stamps   SDOH Barriers (Status:  Goal Met. Patient interviewed and SDOH assessment performed        SDOH Interventions    Flowsheet Row Most Recent Value  SDOH Interventions   Food Insecurity Interventions Other (Comment)  [RNCM called Food and Nutrition for assistance with food benefits application. Patient should recieve a call within 24hrs.]     Patient interviewed and appropriate assessments performed Discussed plans with patient for ongoing care management follow up and provided patient with direct contact information for care management team-   COPD: (Status: Goal on Track (progressing): YES.) Long Term Goal  Reviewed medications with patient, including use of prescribed maintenance and rescue inhalers, and provided instruction on medication management and the importance of adherence Advised patient to use nebulizer as directed, three times daily Advised patient to call for earlier appointment with [name] if he is not feeling well Provided patient with basic written and verbal COPD education on self care/management/and exacerbation prevention Advised patient to track and manage COPD triggers Provided instruction about proper use of medications used for management of COPD including inhalers Advised patient to self assesses COPD action plan zone and make appointment with provider if in the yellow zone for 48 hours without improvement Provided education about and advised patient to utilize infection prevention strategies to reduce risk of respiratory infection Discussed the importance of adequate rest and management of fatigue with COPD Assessed social determinant of health barriers   Patient Goals/Self-Care Activities: Take medications as prescribed   Attend all scheduled provider appointments Call pharmacy for medication refills 3-7 days in advance of running out of medications Perform all self care  activities independently  Call provider office for new concerns or questions  - avoid second hand smoke - limit outdoor activity during cold weather - keep follow-up appointments: 12/22/20 @ 10am with [name] - eat healthy/prescribed diet: DASH - get at least 7 to 8 hours of sleep at night

## 2020-12-11 NOTE — Patient Outreach (Signed)
Medicaid Managed Care   Nurse Care Manager Note  12/11/2020 Name:  James [name] MRN:  [id number] DOB:  1960-02-04  James [name] is an 60 y.o. year old male who is a primary patient of [name], MD.  The [facility] Managed Care Coordination team was consulted for assistance with:    COPD SDOH  [name] was given information about Medicaid Managed Care Coordination team services today. James [name] Patient agreed to services and verbal consent obtained.  Engaged with patient by telephone for follow up visit in response to provider referral for case management and/or care coordination services.   Assessments/Interventions:  Review of past medical history, allergies, medications, health status, including review of consultants reports, laboratory and other test data, was performed as part of comprehensive evaluation and provision of chronic care management services.  SDOH (Social Determinants of Health) assessments and interventions performed: SDOH Interventions    Flowsheet Row Most Recent Value  SDOH Interventions   Physical Activity Interventions Intervention Not Indicated  [Patient unable due to medical condition]       Care Plan  Allergies  Allergen Reactions   Gabapentin Other (See Comments)    hallucinations    Medications Reviewed Today     Reviewed by [name], RN (Registered Nurse) on 12/11/20 at 1045  Med List Status: <None>   Medication Order Taking? Sig Documenting Provider Last Dose Status Informant  albuterol (PROAIR HFA) 108 (90 Base) MCG/ACT inhaler [id number] Yes Inhale 2 puffs into the lungs every 4 (four) hours as needed. [name], MD Taking Active   albuterol (PROVENTIL) (2.5 MG/3ML) 0.083% nebulizer solution [id number] Yes TAKE 3 MLS (2.5 MG TOTAL) BY NEBULIZATION IN THE MORNING, AT NOON, AND AT BEDTIME. [name], MD Taking Active            Med Note (Stillman Buenger A   Thu Nov 13, 2020 10:09 AM) Only using at night   DULoxetine (CYMBALTA) 60 MG capsule [id number] Yes Take 1 capsule (60 mg total) by mouth daily. [name], MD Taking Active   Fluticasone-Umeclidin-[facility] ELLIPTA) 100-62.5-25 MCG/ACT AEPB [id number] Yes Inhale 1 puff into the lungs daily. [name], MD Taking Active            Med Note James [name], James [name] A   Tue Nov 25, 2020 11:27 AM)    furosemide (LASIX) 20 MG tablet [id number] Yes Take 1 tablet (20 mg total) by mouth daily as needed for edema. [name], MD Taking Active   guaiFENesin (MUCINEX) 600 MG 12 hr tablet [id number] Yes Take 1 tablet (600 mg total) by mouth 2 (two) times daily as needed for cough or to loosen phlegm. [name], MD Taking Active   losartan (COZAAR) 50 MG tablet [id number] Yes Take 1 tablet (50 mg total) by mouth daily. [name], MD Taking Active   metoprolol succinate (TOPROL-XL) 25 MG 24 hr tablet [id number] Yes Take 1 tablet (25 mg total) by mouth daily. [name], MD Taking Active   pantoprazole (PROTONIX) 40 MG tablet [id number] Yes Take 1 tablet (40 mg total) by mouth daily. [name], MD Taking Active   predniSONE (DELTASONE) 10 MG tablet [id number] Yes Take 1 tablet (10 mg total) by mouth daily with breakfast. [name], MD Taking Active   Respiratory Therapy Supplies (FLUTTER) DEVI [id number] Yes Use 4 times daily [name], MD Taking Active Self  thiamine 100 MG tablet [id number] Yes Take 1  tablet (100 mg total) by mouth daily. [name], MD Taking Active             Patient Active Problem List   Diagnosis Date Noted   Neuropathy 09/29/2020   Obesity (BMI 30.0-34.9) 04/19/2020   Bronchiectasis ([place]) 09/24/2019   Type 2 diabetes mellitus without complication, without long-term current use of insulin ([place]) 07/24/2019   Other atopic dermatitis 05/01/2019   Chronic respiratory failure with hypoxia ([place]) 04/23/2019   Intellectual disability 04/23/2019   History of alcohol use 11/23/2018    Hypertension 05/17/2016   History of tobacco abuse 04/06/2016   COPD mixed type ([place]) 03/26/2016    Conditions to be addressed/monitored per PCP order:  COPD and SDOH  Care Plan : RN Care Manager Plan of Care  Updates made by [name], RN since 12/11/2020 12:00 AM     Problem: Chronic Disease Management needs in patient with COPD   Priority: High     Long-Range Goal: Plan of care established for for Chronic Disease management for patient with COPD   Start Date: 11/13/2020  Expected End Date: 01/12/2021  Priority: High  Note:   Current Barriers:  Care Coordination needs related to Limited access to food  Chronic Disease Management support and education needs related to COPD [name] has started receiving food stamps, which is very helpful. He has not been feeling well this week and would like antibiotics. He would like to discuss this at his next visit with [name]. He reports having and taking all of his medications. Only using Nebulizer at bedtime.   RNCM Clinical Goal(s):  Patient will verbalize understanding of plan for management of COPD as evidenced by taking all medications as prescribed, attending all scheduled appointments and contacting provider with questions and concerns take all medications exactly as prescribed and will call provider for medication related questions as evidenced by refilling medications and taking as prescribed    attend all scheduled medical appointments: 12/22/20 @ 10am with [name] as evidenced by provider notes in EMR        demonstrate improved adherence to prescribed treatment plan for COPD as evidenced by taking all medications as prescribed, attending all scheduled appointments and contacting provider with questions and concerns continue to work with RN Care Manager and/or Social Worker to address care management and care coordination needs related to COPD as evidenced by adherence to CM Team Scheduled appointments     through  collaboration with RN Care manager, provider, and care team.   Interventions: Inter-disciplinary care team collaboration (see longitudinal plan of care) Evaluation of current treatment plan related to  self management and patient's adherence to plan as established by provider Provided therapeutic listening-patient now receiving food stamps   SDOH Barriers (Status:  Goal Met. Patient interviewed and SDOH assessment performed        SDOH Interventions    Flowsheet Row Most Recent Value  SDOH Interventions   Food Insecurity Interventions Other (Comment)  [RNCM called Food and Nutrition for assistance with food benefits application. Patient should recieve a call within 24hrs.]     Patient interviewed and appropriate assessments performed Discussed plans with patient for ongoing care management follow up and provided patient with direct contact information for care management team-   COPD: (Status: Goal on Track (progressing): YES.) Long Term Goal  Reviewed medications with patient, including use of prescribed maintenance and rescue inhalers, and provided instruction on medication management and the importance of adherence Advised patient to  use nebulizer as directed, three times daily Advised patient to call for earlier appointment with [name] if he is not feeling well Provided patient with basic written and verbal COPD education on self care/management/and exacerbation prevention Advised patient to track and manage COPD triggers Provided instruction about proper use of medications used for management of COPD including inhalers Advised patient to self assesses COPD action plan zone and make appointment with provider if in the yellow zone for 48 hours without improvement Provided education about and advised patient to utilize infection prevention strategies to reduce risk of respiratory infection Discussed the importance of adequate rest and management of fatigue with COPD Assessed  social determinant of health barriers   Patient Goals/Self-Care Activities: Take medications as prescribed   Attend all scheduled provider appointments Call pharmacy for medication refills 3-7 days in advance of running out of medications Perform all self care activities independently  Call provider office for new concerns or questions  - avoid second hand smoke - limit outdoor activity during cold weather - keep follow-up appointments: 12/22/20 @ 10am with [name] - eat healthy/prescribed diet: DASH - get at least 7 to 8 hours of sleep at night       Follow Up:  Patient agrees to Care Plan and Follow-up.  Plan: The Managed Medicaid care management team will reach out to the patient again over the next 14 days.  Date/time of next scheduled RN care management/care coordination outreach:  12/25/20 @ 10:30am  [name] RN, BSN [place] RN Care Coordinator

## 2020-12-19 ENCOUNTER — Other Ambulatory Visit: Payer: Self-pay

## 2020-12-21 NOTE — Progress Notes (Signed)
Established Patient Office Visit  Subjective:  Patient ID: James [name], male    DOB: Jul 20, 1960  Age: 60 y.o. MRN: [id number]  CC:  Chief Complaint  Patient presents with   Diabetes    HPI James [name] presents for diabetes and COPD follow-up. Patient had prior history of excess alcohol use but he is not been drinking in 8 months.  On arrival A1c was 6.5 and blood pressure 139/86.  Patient states the duloxetine has not been useful to his lower extremity pain.  He is no longer working and is trying to slow down.  He needs refills on multiple medications.  He has been adherent with his Trelegy which appears to been helpful to him.  Patient without any other complaints at this time.   Past Medical History:  Diagnosis Date   Acute on chronic respiratory failure with hypoxia ([place]) 06/08/2013   CAP (community acquired pneumonia) 05/17/2018   See admit 05/17/18 ? rml  with   covid pcr neg - rx augmentin > f/u cxr in 4-6 weeks is fine unless condition declines    Community acquired pneumonia of right lower lobe of lung 05/17/2018   See admit 05/17/18 ? 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   Tick bite 06/03/2018    Past Surgical History:  Procedure Laterality Date   VIDEO ASSISTED THORACOSCOPY (VATS)/THOROCOTOMY Left 06/11/2013   Procedure: VIDEO ASSISTED THORACOSCOPY (VATS)/THOROCOTOMY;  Surgeon: Ivin Poot, MD;  Location: [facility];  Service: Thoracic;  Laterality: Left;   VIDEO BRONCHOSCOPY N/A 06/11/2013   Procedure: VIDEO BRONCHOSCOPY;  Surgeon: Ivin Poot, MD;  Location: [facility];  Service: Thoracic;  Laterality: N/A;   VIDEO BRONCHOSCOPY N/A 08/10/2018    Procedure: VIDEO BRONCHOSCOPY WITH FLUORO;  Surgeon: Candee Furbish, MD;  Location: [facility] ENDOSCOPY;  Service: Endoscopy;  Laterality: N/A;    Family History  Problem Relation Age of Onset   Heart disease Father    COPD Sister     Social History   Socioeconomic History   Marital status: Divorced    Spouse name: Not on file   Number of children: Not on file   Years of education: Not on file   Highest education level: Not on file  Occupational History   Occupation: Dealer   Occupation: Curator  Tobacco Use   Smoking status: Former    Packs/day: 1.50    Years: 40.00    Pack years: 60.00    Types: Cigarettes    Quit date: 2014    Years since quitting: 8.9   Smokeless tobacco: Former    Types: Nurse, children's Use: Never used  Substance and Sexual Activity   Alcohol use: Not Currently    Alcohol/week: 28.0 standard drinks    Types: 28 Cans of beer per week    Comment: 4 beers a night; + jitters with not drinking, denies DTs or seizures   Drug use: Not Currently    Types: Marijuana    Comment: daily; quit using crack and methamphetamine about 5 months ago  Sexual activity: Not Currently    Partners: Female  Other Topics Concern   Not on file  Social History Narrative   Lives alone near [place] Determinants of Health   Financial Resource Strain: High Risk   Difficulty of Paying Living Expenses: Hard  Food Insecurity: Food Insecurity Present   Worried About Charity fundraiser in the Last Year: Often true   Arboriculturist in the Last Year: Often true  Transportation Needs: No Transportation Needs   Lack of Transportation (Medical): No   Lack of Transportation (Non-Medical): No  Physical Activity: Inactive   Days of Exercise per Week: 0 days   Minutes of Exercise per Session: 0 min  Stress: Not on file  Social Connections: Moderately Integrated   Frequency of Communication with Friends and Family: More than three times a week   Frequency  of Social Gatherings with Friends and Family: Once a week   Attends Religious Services: 1 to 4 times per year   Active Member of [employer] or Organizations: Yes   Attends Archivist Meetings: 1 to 4 times per year   Marital Status: Divorced  Human resources officer Violence: Not on file    Outpatient Medications Prior to Visit  Medication Sig Dispense Refill   guaiFENesin (MUCINEX) 600 MG 12 hr tablet Take 1 tablet (600 mg total) by mouth 2 (two) times daily as needed for cough or to loosen phlegm.     Respiratory Therapy Supplies (FLUTTER) DEVI Use 4 times daily 1 each 0   albuterol (PROAIR HFA) 108 (90 Base) MCG/ACT inhaler Inhale 2 puffs into the lungs every 4 (four) hours as needed. 8.5 g 0   albuterol (PROVENTIL) (2.5 MG/3ML) 0.083% nebulizer solution TAKE 3 MLS (2.5 MG TOTAL) BY NEBULIZATION IN THE MORNING, AT NOON, AND AT BEDTIME. 270 mL 1   DULoxetine (CYMBALTA) 60 MG capsule Take 1 capsule (60 mg total) by mouth daily. 60 capsule 3   Fluticasone-Umeclidin-Vilant (TRELEGY ELLIPTA) 100-62.5-25 MCG/ACT AEPB Inhale 1 puff into the lungs daily. 60 each 2   furosemide (LASIX) 20 MG tablet Take 1 tablet (20 mg total) by mouth daily as needed for edema. 30 tablet 2   losartan (COZAAR) 50 MG tablet Take 1 tablet (50 mg total) by mouth daily. 90 tablet 1   metoprolol succinate (TOPROL-XL) 25 MG 24 hr tablet Take 1 tablet (25 mg total) by mouth daily. 90 tablet 3   pantoprazole (PROTONIX) 40 MG tablet Take 1 tablet (40 mg total) by mouth daily. 60 tablet 6   predniSONE (DELTASONE) 10 MG tablet Take 1 tablet (10 mg total) by mouth daily with breakfast. 60 tablet 2   thiamine 100 MG tablet Take 1 tablet (100 mg total) by mouth daily. (Patient not taking: Reported on 12/22/2020) 60 tablet 4   No facility-administered medications prior to visit.    Allergies  Allergen Reactions   Gabapentin Other (See Comments)    hallucinations    ROS Review of Systems  Constitutional:  Negative for  chills, diaphoresis and fever.  HENT:  Negative for congestion, hearing loss, nosebleeds, sore throat and tinnitus.   Eyes:  Negative for photophobia and redness.  Respiratory:  Negative for cough, shortness of breath, wheezing and stridor.   Cardiovascular:  Negative for chest pain, palpitations and leg swelling.  Gastrointestinal:  Negative for abdominal pain, blood in stool, constipation, diarrhea, nausea and vomiting.  Endocrine: Negative for polydipsia.  Genitourinary:  Negative for dysuria, flank pain, frequency,  hematuria and urgency.  Musculoskeletal:  Positive for back pain. Negative for myalgias and neck pain.       Foot pain  Skin:  Negative for rash.  Allergic/Immunologic: Negative for environmental allergies.  Neurological:  Negative for dizziness, tremors, seizures, weakness and headaches.  Hematological:  Does not bruise/bleed easily.  Psychiatric/Behavioral:  Negative for suicidal ideas. The patient is not nervous/anxious.      Objective:    Physical Exam Vitals reviewed.  Constitutional:      Appearance: Normal appearance. He is well-developed. He is not diaphoretic.  HENT:     Head: Normocephalic and atraumatic.     Nose: No nasal deformity, septal deviation, mucosal edema or rhinorrhea.     Right Sinus: No maxillary sinus tenderness or frontal sinus tenderness.     Left Sinus: No maxillary sinus tenderness or frontal sinus tenderness.     Mouth/Throat:     Mouth: Mucous membranes are moist.     Pharynx: Oropharynx is clear. No oropharyngeal exudate.  Eyes:     General: No scleral icterus.    Conjunctiva/sclera: Conjunctivae normal.     Pupils: Pupils are equal, round, and reactive to light.  Neck:     Thyroid: No thyromegaly.     Vascular: No carotid bruit or JVD.     Trachea: Trachea normal. No tracheal tenderness or tracheal deviation.  Cardiovascular:     Rate and Rhythm: Normal rate and regular rhythm.     Chest Wall: PMI is not displaced.     Pulses:  Normal pulses. No decreased pulses.     Heart sounds: Normal heart sounds, S1 normal and S2 normal. Heart sounds not distant. No murmur heard. No systolic murmur is present.  No diastolic murmur is present.    No friction rub. No gallop. No S3 or S4 sounds.  Pulmonary:     Effort: Pulmonary effort is normal. No tachypnea, accessory muscle usage or respiratory distress.     Breath sounds: No stridor. No decreased breath sounds, wheezing, rhonchi or rales.     Comments: Distant breath sounds Chest:     Chest wall: No tenderness.  Abdominal:     General: Bowel sounds are normal. There is no distension.     Palpations: Abdomen is soft. Abdomen is not rigid.     Tenderness: There is no abdominal tenderness. There is no guarding or rebound.  Musculoskeletal:        General: Normal range of motion.     Cervical back: Normal range of motion and neck supple. No edema, erythema or rigidity. No muscular tenderness. Normal range of motion.  Lymphadenopathy:     Head:     Right side of head: No submental or submandibular adenopathy.     Left side of head: No submental or submandibular adenopathy.     Cervical: No cervical adenopathy.  Skin:    General: Skin is warm and dry.     Coloration: Skin is not pale.     Findings: No rash.     Nails: There is no clubbing.     Comments: Severe onychomycosis of toenails  Neurological:     General: No focal deficit present.     Mental Status: He is alert and oriented to person, place, and time. Mental status is at baseline.     Sensory: No sensory deficit.  Psychiatric:        Mood and Affect: Mood normal.        Speech: Speech normal.  Behavior: Behavior normal.        Thought Content: Thought content normal.    BP 139/86    Pulse 96    Resp 16    Wt 197 lb 3.2 oz (89.4 kg)    SpO2 100%    BMI 29.12 kg/m  Wt Readings from Last 3 Encounters:  12/22/20 197 lb 3.2 oz (89.4 kg)  09/29/20 199 lb 12.8 oz (90.6 kg)  09/03/20 202 lb 3.2 oz (91.7  kg)     There are no preventive care reminders to display for this patient.   There are no preventive care reminders to display for this patient.  Lab Results  Component Value Date   TSH 0.325 (L) 04/19/2020   Lab Results  Component Value Date   WBC 7.4 09/03/2020   HGB 13.0 09/03/2020   HCT 40.6 09/03/2020   MCV 89 09/03/2020   PLT 320 09/03/2020   Lab Results  Component Value Date   NA 143 09/03/2020   K 4.3 09/03/2020   CO2 28 09/03/2020   GLUCOSE 211 (H) 09/03/2020   BUN 15 09/03/2020   CREATININE 0.99 09/03/2020   BILITOT 0.2 09/03/2020   ALKPHOS 62 09/03/2020   AST 18 09/03/2020   ALT 17 09/03/2020   PROT 5.9 (L) 09/03/2020   ALBUMIN 4.4 09/03/2020   CALCIUM 9.9 09/03/2020   ANIONGAP 5 08/13/2020   EGFR 88 09/03/2020   No results found for: CHOL No results found for: HDL No results found for: [facility] Lab Results  Component Value Date   TRIG 95 10/21/2019   No results found for: CHOLHDL Lab Results  Component Value Date   HGBA1C 6.5 12/22/2020      Assessment & Plan:   Problem List Items Addressed This Visit       Cardiovascular and Mediastinum   Hypertension (Chronic)    Well-controlled on losartan we will continue same      Relevant Medications   furosemide (LASIX) 20 MG tablet   losartan (COZAAR) 50 MG tablet   metoprolol succinate (TOPROL-XL) 25 MG 24 hr tablet     Respiratory   COPD mixed type (HCC)    COPD stable at this time on Trelegy continue same along with albuterol      Relevant Medications   albuterol (VENTOLIN HFA) 108 (90 Base) MCG/ACT inhaler   albuterol (PROVENTIL) (2.5 MG/3ML) 0.083% nebulizer solution   Fluticasone-Umeclidin-Vilant (TRELEGY ELLIPTA) 100-62.5-25 MCG/ACT AEPB   predniSONE (DELTASONE) 10 MG tablet   Chronic respiratory failure with hypoxia (HCC)    Big changes the patient quit drinking alcohol continue oxygen as prescribed      Bronchiectasis (HCC)    Recurrent flares in the past due to alcohol  use this is stabilized        Endocrine   Type 2 diabetes mellitus without complication, without long-term current use of insulin (HCC) - Primary    A1c down to 6.5 continue current medications      Relevant Medications   losartan (COZAAR) 50 MG tablet   Other Relevant Orders   POCT glucose (manual entry) (Completed)   POCT glycosylated hemoglobin (Hb A1C) (Completed)     Nervous and Auditory   Neuropathy    Adverse reaction to gabapentin and failed duloxetine we will begin a trial of low-dose Lyrica twice daily       Meds ordered this encounter  Medications   albuterol (VENTOLIN HFA) 108 (90 Base) MCG/ACT inhaler    Sig: Inhale 2 puffs into the  lungs every 4 (four) hours as needed.    Dispense:  18 g    Refill:  0   albuterol (PROVENTIL) (2.5 MG/3ML) 0.083% nebulizer solution    Sig: TAKE 3 MLS (2.5 MG TOTAL) BY NEBULIZATION IN THE MORNING, AT NOON, AND AT BEDTIME.    Dispense:  270 mL    Refill:  1   Fluticasone-Umeclidin-Vilant (TRELEGY ELLIPTA) 100-62.5-25 MCG/ACT AEPB    Sig: Inhale 1 puff into the lungs daily.    Dispense:  60 each    Refill:  2   furosemide (LASIX) 20 MG tablet    Sig: Take 1 tablet (20 mg total) by mouth daily as needed for edema.    Dispense:  30 tablet    Refill:  2   losartan (COZAAR) 50 MG tablet    Sig: Take 1 tablet (50 mg total) by mouth daily.    Dispense:  90 tablet    Refill:  1   metoprolol succinate (TOPROL-XL) 25 MG 24 hr tablet    Sig: Take 1 tablet (25 mg total) by mouth daily.    Dispense:  90 tablet    Refill:  3   pantoprazole (PROTONIX) 40 MG tablet    Sig: Take 1 tablet (40 mg total) by mouth daily.    Dispense:  60 tablet    Refill:  6   predniSONE (DELTASONE) 10 MG tablet    Sig: Take 1 tablet (10 mg total) by mouth daily with breakfast.    Dispense:  60 tablet    Refill:  2   pregabalin (LYRICA) 75 MG capsule    Sig: Take 1 capsule (75 mg total) by mouth 2 (two) times daily.    Dispense:  60 capsule    Refill:   1    Follow-up: Return in about 3 months (around 03/22/2021).    Asencion Noble, MD

## 2020-12-22 ENCOUNTER — Other Ambulatory Visit: Payer: Self-pay

## 2020-12-22 ENCOUNTER — Ambulatory Visit: Payer: MEDICAID | Attending: Critical Care Medicine | Admitting: Critical Care Medicine

## 2020-12-22 ENCOUNTER — Encounter: Payer: Self-pay | Admitting: Critical Care Medicine

## 2020-12-22 VITALS — BP 139/86 | HR 96 | Resp 16 | Wt 197.2 lb

## 2020-12-22 DIAGNOSIS — J479 Bronchiectasis, uncomplicated: Secondary | ICD-10-CM

## 2020-12-22 DIAGNOSIS — G629 Polyneuropathy, unspecified: Secondary | ICD-10-CM

## 2020-12-22 DIAGNOSIS — J9611 Chronic respiratory failure with hypoxia: Secondary | ICD-10-CM | POA: Diagnosis not present

## 2020-12-22 DIAGNOSIS — E119 Type 2 diabetes mellitus without complications: Secondary | ICD-10-CM

## 2020-12-22 DIAGNOSIS — I10 Essential (primary) hypertension: Secondary | ICD-10-CM | POA: Diagnosis not present

## 2020-12-22 DIAGNOSIS — J449 Chronic obstructive pulmonary disease, unspecified: Secondary | ICD-10-CM | POA: Diagnosis not present

## 2020-12-22 LAB — POCT GLYCOSYLATED HEMOGLOBIN (HGB A1C): HbA1c, POC (controlled diabetic range): 6.5 % (ref 0.0–7.0)

## 2020-12-22 LAB — GLUCOSE, POCT (MANUAL RESULT ENTRY): POC Glucose: 190 mg/dl — AB (ref 70–99)

## 2020-12-22 MED ORDER — ALBUTEROL SULFATE (2.5 MG/3ML) 0.083% IN NEBU
INHALATION_SOLUTION | RESPIRATORY_TRACT | 1 refills | Status: DC
  Filled 2020-12-22 – 2021-05-12 (×2): qty 270, 90d supply, fill #0

## 2020-12-22 MED ORDER — LOSARTAN POTASSIUM 50 MG PO TABS
50.0000 mg | ORAL_TABLET | Freq: Every day | ORAL | 1 refills | Status: DC
  Filled 2020-12-22: qty 90, 90d supply, fill #0

## 2020-12-22 MED ORDER — PREGABALIN 75 MG PO CAPS
75.0000 mg | ORAL_CAPSULE | Freq: Two times a day (BID) | ORAL | 1 refills | Status: DC
Start: 2020-12-22 — End: 2021-03-10
  Filled 2020-12-22: qty 60, 30d supply, fill #0

## 2020-12-22 MED ORDER — ALBUTEROL SULFATE HFA 108 (90 BASE) MCG/ACT IN AERS
2.0000 | INHALATION_SPRAY | RESPIRATORY_TRACT | 0 refills | Status: DC | PRN
  Filled 2020-12-22: qty 18, 53d supply, fill #0

## 2020-12-22 MED ORDER — METOPROLOL SUCCINATE ER 25 MG PO TB24
25.0000 mg | ORAL_TABLET | Freq: Every day | ORAL | 3 refills | Status: DC
  Filled 2020-12-22: qty 90, 90d supply, fill #0

## 2020-12-22 MED ORDER — PREDNISONE 10 MG PO TABS
10.0000 mg | ORAL_TABLET | Freq: Every day | ORAL | 2 refills | Status: DC
  Filled 2020-12-22 – 2021-01-26 (×2): qty 30, 30d supply, fill #0
  Filled 2021-01-26: qty 30, 30d supply, fill #1

## 2020-12-22 MED ORDER — PANTOPRAZOLE SODIUM 40 MG PO TBEC
40.0000 mg | DELAYED_RELEASE_TABLET | Freq: Every day | ORAL | 6 refills | Status: DC
  Filled 2020-12-22: qty 60, 60d supply, fill #0
  Filled 2021-01-26: qty 90, 90d supply, fill #0
  Filled 2021-01-26: qty 60, 60d supply, fill #0

## 2020-12-22 MED ORDER — TRELEGY ELLIPTA 100-62.5-25 MCG/ACT IN AEPB
1.0000 | INHALATION_SPRAY | Freq: Every day | RESPIRATORY_TRACT | 2 refills | Status: DC
  Filled 2020-12-22: qty 60, 30d supply, fill #0

## 2020-12-22 MED ORDER — FUROSEMIDE 20 MG PO TABS
20.0000 mg | ORAL_TABLET | Freq: Every day | ORAL | 2 refills | Status: DC | PRN
  Filled 2020-12-22: qty 30, 30d supply, fill #0
  Filled 2021-01-26: qty 60, 60d supply, fill #0
  Filled 2021-01-26: qty 30, 30d supply, fill #1

## 2020-12-22 NOTE — Assessment & Plan Note (Signed)
Well-controlled on losartan we will continue same

## 2020-12-22 NOTE — Assessment & Plan Note (Signed)
COPD stable at this time on Trelegy continue same along with albuterol

## 2020-12-22 NOTE — Assessment & Plan Note (Signed)
Recurrent flares in the past due to alcohol use this is stabilized

## 2020-12-22 NOTE — Assessment & Plan Note (Signed)
A1c down to 6.5 continue current medications

## 2020-12-22 NOTE — Assessment & Plan Note (Signed)
Big changes the patient quit drinking alcohol continue oxygen as prescribed

## 2020-12-22 NOTE — Assessment & Plan Note (Signed)
Adverse reaction to gabapentin and failed duloxetine we will begin a trial of low-dose Lyrica twice daily

## 2020-12-22 NOTE — Patient Instructions (Signed)
All medications were refilled to our pharmacy  Discontinue duloxetine and begin Lyrica pregabalin 1 pill twice daily for back and foot pain this was sent to our pharmacy  We offered you the pneumonia vaccine  A foot doctor appointment will be made to work on your toenails  Congratulations on stopping alcohol use this made a tremendous difference for you and also congratulations on reducing your work schedule this also has made a tremendous difference  Your blood pressure and hemoglobin A1c for your diabetes were in good control today  Return to see [name] in 3 months

## 2020-12-25 ENCOUNTER — Other Ambulatory Visit: Payer: Self-pay | Admitting: *Deleted

## 2020-12-25 ENCOUNTER — Other Ambulatory Visit: Payer: Self-pay

## 2020-12-25 NOTE — Patient Instructions (Addendum)
Visit Information  Mr. Clutter was given information about Medicaid Managed Care team care coordination services as a part of their Healthy [facility] Medicaid benefit. [name] verbally consented to engagement with the [facility] Managed Care team.   If you are experiencing a medical emergency, please call 911 or report to your local emergency department or urgent care.   If you have a non-emergency medical problem during routine business hours, please contact your provider's office and ask to speak with a nurse.   For questions related to your Healthy [facility] health plan, please call: [phone] or visit the homepage here: [email or web address]  If you would like to schedule transportation through your Healthy [facility] plan, please call the following number at least 2 days in advance of your appointment: [phone]  Call the [place] at [phone], at any time, 24 hours a day, 7 days a week. If you are in danger or need immediate medical attention call 911.  If you would like help to quit smoking, call 1-800-QUIT-NOW 3081722751) OR Espaol: 1-855-Djelo-Ya (3-007-622-6333) o para ms informacin haga clic aqu or Text READY to 200-400 to register via text  Mr. Ciampi,   Please see education materials related to COPD provided as print materials.   The patient verbalized understanding of instructions provided today and agreed to receive a mailed copy of patient instruction and/or educational materials.  Telephone follow up appointment with Managed Medicaid care management team member scheduled for:01/26/21 @ 10:30am  [name] RN, BSN [place] RN Care Coordinator   Following is a copy of your plan of care:  Care Plan : RN Care Manager Plan of Care  Updates made by [name], RN since 12/25/2020 12:00 AM     Problem: Chronic Disease Management needs in patient with COPD    Priority: High     Long-Range Goal: Plan of care established for for Chronic Disease management for patient with COPD   Start Date: 11/13/2020  Expected End Date: 01/12/2021  Priority: High  Note:   Current Barriers:  Care Coordination needs related to Limited access to food  Chronic Disease Management support and education needs related to COPD Mr. Parcell had a follow up with PCP this week and received a good report. He is very happy that he quit drinking and "feels like a million bucks since quitting".  He reports having and taking all of his medications. Continues using Nebulizer only at bedtime.   RNCM Clinical Goal(s):  Patient will verbalize understanding of plan for management of COPD as evidenced by taking all medications as prescribed, attending all scheduled appointments and contacting provider with questions and concerns take all medications exactly as prescribed and will call provider for medication related questions as evidenced by refilling medications and taking as prescribed    attend all scheduled medical appointments: 03/23/21 @ 10am with [name] as evidenced by provider notes in EMR        demonstrate improved adherence to prescribed treatment plan for COPD as evidenced by taking all medications as prescribed, attending all scheduled appointments and contacting provider with questions and concerns continue to work with RN Care Manager and/or Social Worker to address care management and care coordination needs related to COPD as evidenced by adherence to CM Team Scheduled appointments     through collaboration with RN Care manager, provider, and care team.   Interventions: Inter-disciplinary care team collaboration (see longitudinal plan of care) Evaluation of current treatment  plan related to  self management and patient's adherence to plan as established by provider Provided therapeutic listening-patient now receiving food stamps   SDOH Barriers (Status:  Goal  Met. Patient interviewed and SDOH assessment performed        SDOH Interventions    Flowsheet Row Most Recent Value  SDOH Interventions   Food Insecurity Interventions Other (Comment)  [RNCM called [employer] for assistance with food benefits application. Patient should recieve a call within 24hrs.]     Patient interviewed and appropriate assessments performed Discussed plans with patient for ongoing care management follow up and provided patient with direct contact information for care management team-   COPD: (Status: Goal on Track (progressing): YES.) Long Term Goal  Reviewed medications with patient, including use of prescribed maintenance and rescue inhalers, and provided instruction on medication management and the importance of adherence Advised patient to use nebulizer as directed, three times daily Provided instruction about proper use of medications used for management of COPD including inhalers Advised patient to self assesses COPD action plan zone and make appointment with provider if in the yellow zone for 48 hours without improvement Provided education about and advised patient to utilize infection prevention strategies to reduce risk of respiratory infection Discussed the importance of adequate rest and management of fatigue with COPD Assessed social determinant of health barriers   Patient Goals/Self-Care Activities: Take medications as prescribed   Attend all scheduled provider appointments Call pharmacy for medication refills 3-7 days in advance of running out of medications Perform all self care activities independently  Call provider office for new concerns or questions  - avoid second hand smoke - limit outdoor activity during cold weather - keep follow-up appointments: 03/23/21 @ 10am with [name] - eat healthy/prescribed diet: DASH - get at least 7 to 8 hours of sleep at night

## 2020-12-25 NOTE — Patient Outreach (Signed)
Medicaid Managed Care   Nurse Care Manager Note  12/25/2020 Name:  James [name] MRN:  [id number] DOB:  Jul 21, 1960  James [name] is an 60 y.o. year old male who is a primary patient of James Gaskins Burnett Harry, MD.  The [facility] Managed Care Coordination team was consulted for assistance with:    COPD  [name] was given information about Medicaid Managed Care Coordination team services today. James [name] Patient agreed to services and verbal consent obtained.  Engaged with patient by telephone for follow up visit in response to provider referral for case management and/or care coordination services.   Assessments/Interventions:  Review of past medical history, allergies, medications, health status, including review of consultants reports, laboratory and other test data, was performed as part of comprehensive evaluation and provision of chronic care management services.  SDOH (Social Determinants of Health) assessments and interventions performed: SDOH Interventions    Flowsheet Row Most Recent Value  SDOH Interventions   Food Insecurity Interventions Other (Comment)  [Patient now receiving food stamps]  Financial Strain Interventions Intervention Not Indicated  Stress Interventions Intervention Not Indicated       Care Plan  Allergies  Allergen Reactions   Gabapentin Other (See Comments)    hallucinations    Medications Reviewed Today     Reviewed by [name], RN (Registered Nurse) on 12/25/20 at [place] List Status: <None>   Medication Order Taking? Sig Documenting Provider Last Dose Status Informant  albuterol (PROVENTIL) (2.5 MG/3ML) 0.083% nebulizer solution [id number] Yes TAKE 3 MLS (2.5 MG TOTAL) BY NEBULIZATION IN THE MORNING, AT NOON, AND AT BEDTIME. [name], MD Taking Active            Med Note (Aristide Waggle A   Thu Dec 25, 2020 10:08 AM) Only using at bedtime  albuterol (VENTOLIN HFA) 108 (90 Base) MCG/ACT inhaler [id number] Yes Inhale 2 puffs  into the lungs every 4 (four) hours as needed. [name], MD Taking Active   Fluticasone-Umeclidin-Vilant [facility] ELLIPTA) 100-62.5-25 MCG/ACT AEPB [id number] Yes Inhale 1 puff into the lungs daily. [name], MD Taking Active   furosemide (LASIX) 20 MG tablet [id number] Yes Take 1 tablet (20 mg total) by mouth daily as needed for edema. [name], MD Taking Active   guaiFENesin (MUCINEX) 600 MG 12 hr tablet [id number] Yes Take 1 tablet (600 mg total) by mouth 2 (two) times daily as needed for cough or to loosen phlegm. [name], MD Taking Active   losartan (COZAAR) 50 MG tablet [id number] Yes Take 1 tablet (50 mg total) by mouth daily. [name], MD Taking Active   metoprolol succinate (TOPROL-XL) 25 MG 24 hr tablet [id number] Yes Take 1 tablet (25 mg total) by mouth daily. [name], MD Taking Active   pantoprazole (PROTONIX) 40 MG tablet [id number] Yes Take 1 tablet (40 mg total) by mouth daily. [name], MD Taking Active   predniSONE (DELTASONE) 10 MG tablet [id number] Yes Take 1 tablet (10 mg total) by mouth daily with breakfast. [name], MD Taking Active   pregabalin (LYRICA) 75 MG capsule [id number] Yes Take 1 capsule (75 mg total) by mouth 2 (two) times daily. [name], MD Taking Active            Med Note [name], [name] A   Thu Dec 25, 2020 10:09 AM) Taking once a day  Respiratory Therapy Supplies (FLUTTER) DEVI [id number] Yes Use 4 times daily James Gaskins,  Burnett Harry, MD Taking Active Self            Patient Active Problem List   Diagnosis Date Noted   Neuropathy 09/29/2020   Obesity (BMI 30.0-34.9) 04/19/2020   Bronchiectasis ([place]) 09/24/2019   Type 2 diabetes mellitus without complication, without long-term current use of insulin ([place]) 07/24/2019   Other atopic dermatitis 05/01/2019   Chronic respiratory failure with hypoxia ([place]) 04/23/2019   Intellectual disability 04/23/2019   History of alcohol use 11/23/2018    Hypertension 05/17/2016   History of tobacco abuse 04/06/2016   COPD mixed type ([place]) 03/26/2016    Conditions to be addressed/monitored per PCP order:  COPD  Care Plan : RN Care Manager Plan of Care  Updates made by [name], RN since 12/25/2020 12:00 AM     Problem: Chronic Disease Management needs in patient with COPD   Priority: High     Long-Range Goal: Plan of care established for for Chronic Disease management for patient with COPD   Start Date: 11/13/2020  Expected End Date: 01/12/2021  Priority: High  Note:   Current Barriers:  Care Coordination needs related to Limited access to food  Chronic Disease Management support and education needs related to COPD [name] had a follow up with PCP this week and received a good report. He is very happy that he quit drinking and "feels like a million bucks since quitting".  He reports having and taking all of his medications. Continues using Nebulizer only at bedtime.   RNCM Clinical Goal(s):  Patient will verbalize understanding of plan for management of COPD as evidenced by taking all medications as prescribed, attending all scheduled appointments and contacting provider with questions and concerns take all medications exactly as prescribed and will call provider for medication related questions as evidenced by refilling medications and taking as prescribed    attend all scheduled medical appointments: 03/23/21 @ 10am with [name] as evidenced by provider notes in EMR        demonstrate improved adherence to prescribed treatment plan for COPD as evidenced by taking all medications as prescribed, attending all scheduled appointments and contacting provider with questions and concerns continue to work with RN Care Manager and/or Social Worker to address care management and care coordination needs related to COPD as evidenced by adherence to CM Team Scheduled appointments     through collaboration with RN Care manager, provider,  and care team.   Interventions: Inter-disciplinary care team collaboration (see longitudinal plan of care) Evaluation of current treatment plan related to  self management and patient's adherence to plan as established by provider Provided therapeutic listening-patient now receiving food stamps   SDOH Barriers (Status:  Goal Met. Patient interviewed and SDOH assessment performed        SDOH Interventions    Flowsheet Row Most Recent Value  SDOH Interventions   Food Insecurity Interventions Other (Comment)  [RNCM called [employer] for assistance with food benefits application. Patient should recieve a call within 24hrs.]     Patient interviewed and appropriate assessments performed Discussed plans with patient for ongoing care management follow up and provided patient with direct contact information for care management team-   COPD: (Status: Goal on Track (progressing): YES.) Long Term Goal  Reviewed medications with patient, including use of prescribed maintenance and rescue inhalers, and provided instruction on medication management and the importance of adherence Advised patient to use nebulizer as directed, three times daily Provided instruction about proper use of  medications used for management of COPD including inhalers Advised patient to self assesses COPD action plan zone and make appointment with provider if in the yellow zone for 48 hours without improvement Provided education about and advised patient to utilize infection prevention strategies to reduce risk of respiratory infection Discussed the importance of adequate rest and management of fatigue with COPD Assessed social determinant of health barriers   Patient Goals/Self-Care Activities: Take medications as prescribed   Attend all scheduled provider appointments Call pharmacy for medication refills 3-7 days in advance of running out of medications Perform all self care activities independently  Call  provider office for new concerns or questions  - avoid second hand smoke - limit outdoor activity during cold weather - keep follow-up appointments: 03/23/21 @ 10am with [name] - eat healthy/prescribed diet: DASH - get at least 7 to 8 hours of sleep at night       Follow Up:  Patient agrees to Care Plan and Follow-up.  Plan: The Managed Medicaid care management team will reach out to the patient again over the next 30 days.  Date/time of next scheduled RN care management/care coordination outreach:  01/26/21 @ 10:30am  [name] RN, BSN [place] RN Care Coordinator

## 2020-12-31 ENCOUNTER — Encounter (HOSPITAL_COMMUNITY): Payer: Self-pay | Admitting: Emergency Medicine

## 2020-12-31 ENCOUNTER — Emergency Department (HOSPITAL_COMMUNITY): Payer: Medicaid Other

## 2020-12-31 ENCOUNTER — Other Ambulatory Visit: Payer: Self-pay

## 2020-12-31 ENCOUNTER — Emergency Department (HOSPITAL_COMMUNITY)
Admission: EM | Admit: 2020-12-31 | Discharge: 2021-01-01 | Disposition: A | Discharge status: Home / Self Care | Payer: Medicaid Other | Attending: Emergency Medicine | Admitting: Emergency Medicine

## 2020-12-31 DIAGNOSIS — Z7951 Long term (current) use of inhaled steroids: Secondary | ICD-10-CM | POA: Diagnosis not present

## 2020-12-31 DIAGNOSIS — S299XXA Unspecified injury of thorax, initial encounter: Secondary | ICD-10-CM | POA: Diagnosis not present

## 2020-12-31 DIAGNOSIS — Z79899 Other long term (current) drug therapy: Secondary | ICD-10-CM | POA: Insufficient documentation

## 2020-12-31 DIAGNOSIS — E119 Type 2 diabetes mellitus without complications: Secondary | ICD-10-CM | POA: Insufficient documentation

## 2020-12-31 DIAGNOSIS — W010XXA Fall on same level from slipping, tripping and stumbling without subsequent striking against object, initial encounter: Secondary | ICD-10-CM | POA: Diagnosis not present

## 2020-12-31 DIAGNOSIS — I10 Essential (primary) hypertension: Secondary | ICD-10-CM | POA: Diagnosis not present

## 2020-12-31 DIAGNOSIS — Z8616 Personal history of COVID-19: Secondary | ICD-10-CM | POA: Diagnosis not present

## 2020-12-31 DIAGNOSIS — Z87891 Personal history of nicotine dependence: Secondary | ICD-10-CM | POA: Diagnosis not present

## 2020-12-31 DIAGNOSIS — J449 Chronic obstructive pulmonary disease, unspecified: Secondary | ICD-10-CM | POA: Diagnosis not present

## 2020-12-31 DIAGNOSIS — W19XXXA Unspecified fall, initial encounter: Secondary | ICD-10-CM | POA: Diagnosis not present

## 2020-12-31 MED ORDER — OXYCODONE-ACETAMINOPHEN 5-325 MG PO TABS
1.0000 | ORAL_TABLET | Freq: Once | ORAL | Status: AC
  Administered 2020-12-31: 22:00:00 1 via ORAL
  Filled 2020-12-31: qty 1

## 2020-12-31 NOTE — ED Triage Notes (Signed)
Patient arrived with EMS from home , tripped on his tool box and fell landed on his left side , denies LOC , reports pain at left lateral ribcage , respirations unlabored.

## 2020-12-31 NOTE — ED Provider Notes (Signed)
Emergency Medicine Provider Triage Evaluation Note  James [name] , a 60 y.o. male  was evaluated in triage.  Pt complains of left rib pain.  States that he fell while working in the garage.  Pain is worsened with palpation and movement.  Denies head injury or LOC.  Review of Systems  Positive: Left rib pain Negative: Head pain, LCO  Physical Exam  BP (!) 147/90 (BP Location: Right Arm)    Pulse 74    Temp 98 F (36.7 C) (Oral)    Resp 18    Ht 5\' 9"  (1.753 m)    Wt 104 kg    SpO2 97%    BMI 33.86 kg/m  Gen:   Awake, no distress   Resp:  Normal effort  MSK:   Moves extremities without difficulty  Other:  Left ribs TTP  Medical Decision Making  Medically screening exam initiated at 10:15 PM.  Appropriate orders placed.  James [name] was informed that the remainder of the evaluation will be completed by another provider, this initial triage assessment does not replace that evaluation, and the importance of remaining in the ED until their evaluation is complete.  Fall, rib pain   Montine Circle, PA-C 12/31/20 2216    Drenda Freeze, MD 01/02/21 [phone]

## 2021-01-01 ENCOUNTER — Other Ambulatory Visit: Payer: Self-pay

## 2021-01-01 MED ORDER — HYDROCODONE-ACETAMINOPHEN 5-325 MG PO TABS: 1.0000 | ORAL_TABLET | Freq: Four times a day (QID) | ORAL | 0 refills | Status: DC | PRN

## 2021-01-01 MED ORDER — IBUPROFEN 800 MG PO TABS
800.0000 mg | ORAL_TABLET | Freq: Four times a day (QID) | ORAL | 0 refills | Status: DC | PRN
  Filled 2021-01-01: qty 21, 6d supply, fill #0

## 2021-01-01 MED ORDER — HYDROCODONE-ACETAMINOPHEN 5-325 MG PO TABS
1.0000 | ORAL_TABLET | Freq: Four times a day (QID) | ORAL | 0 refills | Status: DC | PRN
  Filled 2021-01-01: qty 12, 3d supply, fill #0

## 2021-01-01 MED ORDER — IBUPROFEN 800 MG PO TABS: 800.0000 mg | ORAL_TABLET | Freq: Four times a day (QID) | ORAL | 0 refills | Status: DC | PRN

## 2021-01-01 MED ORDER — HYDROCODONE-ACETAMINOPHEN 5-325 MG PO TABS
1.0000 | ORAL_TABLET | Freq: Once | ORAL | Status: AC
  Administered 2021-01-01: 09:00:00 1 via ORAL
  Filled 2021-01-01: qty 1

## 2021-01-01 NOTE — ED Notes (Signed)
Pt brought back to room and put on stretcher. Refusing to change into a gown. Using urinal at this time

## 2021-01-01 NOTE — ED Provider Notes (Signed)
[facility] EMERGENCY DEPARTMENT Provider Note   CSN: [id number] Arrival date & time: 12/31/20  2159     History Chief Complaint  Patient presents with   Fall / Rib Injury    James [name] is a 60 y.o. male.  Patient is 60 yo male presenting with rib pain. Patient admits to mechanical fall last night, denies head trauma, denies loc. Patient admits to left sided rib pain with radiation to left back. Denies any bruising. Admits to sob associated with rib pain.   The history is provided by the patient. No language interpreter was used.      Past Medical History:  Diagnosis Date   Acute on chronic respiratory failure with hypoxia ([place]) 06/08/2013   CAP (community acquired pneumonia) 05/17/2018   See admit 05/17/18 ? rml  with   covid pcr neg - rx augmentin > f/u cxr in 4-6 weeks is fine unless condition declines    Community acquired pneumonia of right lower lobe of lung 05/17/2018   See admit 05/17/18 ? 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   Tick bite 06/03/2018    Patient Active Problem List   Diagnosis Date Noted   Neuropathy 09/29/2020   Obesity (BMI 30.0-34.9) 04/19/2020   Bronchiectasis ([place]) 09/24/2019   Type 2 diabetes mellitus without complication, without long-term current use of insulin ([place]) 07/24/2019   Other atopic dermatitis 05/01/2019   Chronic respiratory failure with hypoxia ([place]) 04/23/2019   Intellectual disability 04/23/2019   History of alcohol use 11/23/2018   Hypertension 05/17/2016   History of tobacco abuse 04/06/2016   COPD mixed type ([place]) 03/26/2016    Past Surgical History:  Procedure Laterality Date    VIDEO ASSISTED THORACOSCOPY (VATS)/THOROCOTOMY Left 06/11/2013   Procedure: VIDEO ASSISTED THORACOSCOPY (VATS)/THOROCOTOMY;  Surgeon: Ivin Poot, MD;  Location: [facility];  Service: Thoracic;  Laterality: Left;   VIDEO BRONCHOSCOPY N/A 06/11/2013   Procedure: VIDEO BRONCHOSCOPY;  Surgeon: Ivin Poot, MD;  Location: [facility];  Service: Thoracic;  Laterality: N/A;   VIDEO BRONCHOSCOPY N/A 08/10/2018   Procedure: VIDEO BRONCHOSCOPY WITH FLUORO;  Surgeon: Candee Furbish, MD;  Location: [facility] ENDOSCOPY;  Service: Endoscopy;  Laterality: N/A;       Family History  Problem Relation Age of Onset   Heart disease Father    COPD Sister     Social History   Tobacco Use   Smoking status: Former    Packs/day: 1.50    Years: 40.00    Pack years: 60.00    Types: Cigarettes    Quit date: 2014    Years since quitting: 9.0   Smokeless tobacco: Former    Types: Nurse, children's Use: Never used  Substance Use Topics   Alcohol use: Not Currently    Alcohol/week: 28.0 standard drinks    Types: 28 Cans of beer per week    Comment: 4 beers a night; + jitters with not drinking, denies DTs or seizures   Drug use: Not Currently    Types: Marijuana    Comment:  daily; quit using crack and methamphetamine about 5 months ago     Home Medications Prior to Admission medications   Medication Sig Start Date End Date Taking? Authorizing Provider  HYDROcodone-acetaminophen (NORCO) 5-325 MG tablet Take 1 tablet by mouth every 6 (six) hours as needed for moderate pain or severe pain. 16/10/96  Yes Campbell Stall P, DO  albuterol (PROVENTIL) (2.5 MG/3ML) 0.083% nebulizer solution TAKE 3 MLS (2.5 MG TOTAL) BY NEBULIZATION IN THE MORNING, AT NOON, AND AT BEDTIME. 12/22/20 12/22/21  [name], MD  albuterol (VENTOLIN HFA) 108 (90 Base) MCG/ACT inhaler Inhale 2 puffs into the lungs every 4 (four) hours as needed. 12/22/20   [name], MD  Fluticasone-Umeclidin-Vilant (TRELEGY ELLIPTA) 100-62.5-25  MCG/ACT AEPB Inhale 1 puff into the lungs daily. 12/22/20   [name], MD  furosemide (LASIX) 20 MG tablet Take 1 tablet (20 mg total) by mouth daily as needed for edema. 12/22/20 12/22/21  [name], MD  guaiFENesin (MUCINEX) 600 MG 12 hr tablet Take 1 tablet (600 mg total) by mouth 2 (two) times daily as needed for cough or to loosen phlegm. 06/29/20 06/29/21  [name], MD  ibuprofen (ADVIL) 800 MG tablet Take 1 tablet (800 mg total) by mouth every 6 (six) hours as needed for mild pain. 04/54/09   Campbell Stall P, DO  losartan (COZAAR) 50 MG tablet Take 1 tablet (50 mg total) by mouth daily. 12/22/20   [name], MD  metoprolol succinate (TOPROL-XL) 25 MG 24 hr tablet Take 1 tablet (25 mg total) by mouth daily. 12/22/20 12/22/21  [name], MD  pantoprazole (PROTONIX) 40 MG tablet Take 1 tablet (40 mg total) by mouth daily. 12/22/20   [name], MD  predniSONE (DELTASONE) 10 MG tablet Take 1 tablet (10 mg total) by mouth daily with breakfast. 12/22/20 01/21/21  [name], MD  pregabalin (LYRICA) 75 MG capsule Take 1 capsule (75 mg total) by mouth 2 (two) times daily. 12/22/20   [name], MD  Respiratory Therapy Supplies (FLUTTER) DEVI Use 4 times daily 04/27/18   [name], MD    Allergies    Gabapentin  Review of Systems   Review of Systems  Constitutional:  Negative for chills and fever.  HENT:  Negative for ear pain and sore throat.   Eyes:  Negative for pain and visual disturbance.  Respiratory:  Negative for cough and shortness of breath.   Cardiovascular:  Positive for chest pain. Negative for palpitations.  Gastrointestinal:  Negative for abdominal pain and vomiting.  Genitourinary:  Negative for dysuria and hematuria.  Musculoskeletal:  Negative for arthralgias and back pain.  Skin:  Negative for color change and rash.  Neurological:  Negative for seizures and syncope.  All other systems reviewed and are  negative.  Physical Exam Updated Vital Signs BP (!) 162/86 (BP Location: Right Arm)    Pulse 82    Temp 98.9 F (37.2 C) (Oral)    Resp 20    Ht 5\' 9"  (1.753 m)    Wt 104 kg    SpO2 100%    BMI 33.86 kg/m   Physical Exam Vitals and nursing note reviewed.  Constitutional:      General: He is not in acute distress.    Appearance: He is well-developed.  HENT:     Head: Normocephalic and atraumatic.  Eyes:     Conjunctiva/sclera: Conjunctivae normal.  Cardiovascular:     Rate and Rhythm:  Normal rate and regular rhythm.     Heart sounds: No murmur heard. Pulmonary:     Effort: Pulmonary effort is normal. No respiratory distress.     Breath sounds: Normal breath sounds.  Chest:     Chest wall: Tenderness present.    Abdominal:     Palpations: Abdomen is soft.     Tenderness: There is no abdominal tenderness.  Musculoskeletal:        General: No swelling.     Cervical back: Neck supple.  Skin:    General: Skin is warm and dry.     Capillary Refill: Capillary refill takes less than 2 seconds.  Neurological:     Mental Status: He is alert.  Psychiatric:        Mood and Affect: Mood normal.    ED Results / Procedures / Treatments   Labs (all labs ordered are listed, but only abnormal results are displayed) Labs Reviewed - No data to display  EKG None  Radiology DG Ribs Unilateral W/Chest Left  Result Date: 12/31/2020 CLINICAL DATA:  Fall.  Trauma to the left chest wall. EXAM: LEFT RIBS AND CHEST - 3+ VIEW COMPARISON:  Chest radiograph dated 08/11/2020. FINDINGS: Diffuse chronic interstitial coarsening. Left lung base atelectasis/scarring. No focal consolidation, pleural effusion or pneumothorax. The cardiac silhouette is within limits. The multiple old healed left posterior rib fractures. No definite acute osseous pathology. IMPRESSION: No definite acute rib fracture.  No pneumothorax. Electronically Signed   By: Anner Crete M.D.   On: 12/31/2020 22:53     Procedures Procedures   Medications Ordered in ED Medications  oxyCODONE-acetaminophen (PERCOCET/ROXICET) 5-325 MG per tablet 1 tablet (1 tablet Oral Given 12/31/20 2213)  HYDROcodone-acetaminophen (NORCO/VICODIN) 5-325 MG per tablet 1 tablet (1 tablet Oral Given 01/01/21 0859)    ED Course  I have reviewed the triage vital signs and the nursing notes.  Pertinent labs & imaging results that were available during my care of the patient were reviewed by me and considered in my medical decision making (see chart for details).    MDM Rules/Calculators/A&P                         9:22 PM  60 yo male presenting with rib pain after fall. Pt ix Aox3, no acute distress, afebrile, with stable vitals. Physical exam demonstrates tenderness to palpation of left ribs 5-7. No ecchymosis or open wounds. No deformities or crepitus. Stable respiratory status. CXR demonstrates no fractures. Norco given for pain with incentive spirometry for home.   Patient in no distress and overall condition improved here in the ED. Detailed discussions were had with the patient regarding current findings, and need for close f/u with PCP in 14 days if pain continues for repeat imaging. Prescriptions for pain control sent to pharmacy. The patient has been instructed to return immediately if the symptoms worsen in any way for re-evaluation. Patient verbalized understanding and is in agreement with current care plan. All questions answered prior to discharge.   Final Clinical Impression(s) / ED Diagnoses Final diagnoses:  Traumatic injury of rib    Rx / DC Orders ED Discharge Orders          Ordered    HYDROcodone-acetaminophen (NORCO) 5-325 MG tablet  Every 6 hours PRN,   Status:  Discontinued        01/01/21 0851    ibuprofen (ADVIL) 800 MG tablet  Every 6 hours PRN,   Status:  Discontinued        01/01/21 0851    ibuprofen (ADVIL) 800 MG tablet  Every 6 hours PRN        01/01/21 0926     HYDROcodone-acetaminophen (NORCO) 5-325 MG tablet  Every 6 hours PRN        01/01/21 0926             Lianne Cure, DO 70/14/10 2122

## 2021-01-01 NOTE — Discharge Instructions (Signed)
Chest Xray demonstrates no rib fractures. Rest Use incentive spirometry at home to prevent lung infection Take Motrin every 6 hours for pain x 1 week. Take Norco (narcotic) for pain every 6 hours as needed

## 2021-01-01 NOTE — ED Notes (Signed)
Pt refuses incentive spirometry. Pt states understanding of discharge instructions and denies questions at this time.

## 2021-01-02 ENCOUNTER — Telehealth: Payer: Self-pay

## 2021-01-02 NOTE — Telephone Encounter (Signed)
Transition Care Management Follow-up Telephone Call Date of discharge and from where: 01/01/2021-[facility]  How have you been since you were released from the hospital? Patient stated his ribs still hurt when he coughs but he has started his pain medications.   Any questions or concerns? No  Items Reviewed: Did the pt receive and understand the discharge instructions provided? Yes  Medications obtained and verified? Yes  Other? No  Any new allergies since your discharge? No  Dietary orders reviewed? No Do you have support at home? Yes   Home Care and Equipment/Supplies: Were home health services ordered? not applicable If so, what is the name of the agency? N/A  Has the agency set up a time to come to the patient's home? not applicable Were any new equipment or medical supplies ordered?  No What is the name of the medical supply agency? N/A Were you able to get the supplies/equipment? not applicable Do you have any questions related to the use of the equipment or supplies? No  Functional Questionnaire: (I = Independent and D = Dependent) ADLs: I  Bathing/Dressing- I  Meal Prep- I  Eating- I  Maintaining continence- I  Transferring/Ambulation- I  Managing Meds- I  Follow up appointments reviewed:  PCP Hospital f/u appt confirmed? No   Specialist Hospital f/u appt confirmed? No   Are transportation arrangements needed? No  If their condition worsens, is the pt aware to call PCP or go to the Emergency Dept.? Yes Was the patient provided with contact information for the PCP's office or ED? Yes Was to pt encouraged to call back with questions or concerns? Yes

## 2021-01-26 ENCOUNTER — Other Ambulatory Visit: Payer: Self-pay

## 2021-01-26 ENCOUNTER — Other Ambulatory Visit: Payer: Self-pay | Admitting: *Deleted

## 2021-01-26 ENCOUNTER — Encounter (HOSPITAL_COMMUNITY): Payer: Self-pay

## 2021-01-26 ENCOUNTER — Ambulatory Visit (INDEPENDENT_AMBULATORY_CARE_PROVIDER_SITE_OTHER): Payer: MEDICAID

## 2021-01-26 ENCOUNTER — Ambulatory Visit (HOSPITAL_COMMUNITY)
Admission: EM | Admit: 2021-01-26 | Discharge: 2021-01-26 | Disposition: A | Discharge status: Home / Self Care | Payer: MEDICAID | Attending: Family Medicine | Admitting: Family Medicine

## 2021-01-26 ENCOUNTER — Ambulatory Visit: Payer: Self-pay | Admitting: *Deleted

## 2021-01-26 DIAGNOSIS — J441 Chronic obstructive pulmonary disease with (acute) exacerbation: Secondary | ICD-10-CM | POA: Diagnosis not present

## 2021-01-26 DIAGNOSIS — R062 Wheezing: Secondary | ICD-10-CM | POA: Diagnosis not present

## 2021-01-26 DIAGNOSIS — R059 Cough, unspecified: Secondary | ICD-10-CM

## 2021-01-26 DIAGNOSIS — R918 Other nonspecific abnormal finding of lung field: Secondary | ICD-10-CM | POA: Diagnosis not present

## 2021-01-26 MED ORDER — PREDNISONE 20 MG PO TABS
40.0000 mg | ORAL_TABLET | Freq: Every day | ORAL | 0 refills | Status: DC
  Filled 2021-01-27: qty 10, 5d supply, fill #0

## 2021-01-26 MED ORDER — DOXYCYCLINE HYCLATE 100 MG PO TABS
100.0000 mg | ORAL_TABLET | Freq: Two times a day (BID) | ORAL | 0 refills | Status: DC
Start: 2021-01-26 — End: 2021-02-04
  Filled 2021-01-27: qty 14, 7d supply, fill #0

## 2021-01-26 MED ORDER — DOXYCYCLINE HYCLATE 100 MG PO TABS
100.0000 mg | ORAL_TABLET | Freq: Two times a day (BID) | ORAL | 0 refills | Status: DC
  Filled 2021-01-26: qty 14, 7d supply, fill #0

## 2021-01-26 MED ORDER — LIDOCAINE HCL (PF) 1 % IJ SOLN: 2.0000 mL | Freq: Once | INTRAMUSCULAR | Status: DC

## 2021-01-26 MED ORDER — PREDNISONE 20 MG PO TABS
40.0000 mg | ORAL_TABLET | Freq: Every day | ORAL | 0 refills | Status: DC
Start: 2021-01-26 — End: 2021-01-26
  Filled 2021-01-26: qty 10, 5d supply, fill #0

## 2021-01-26 NOTE — Discharge Instructions (Addendum)
Your chest x-ray showed possible early pneumonia.  Take doxycycline 100 mg 1 tab twice daily for 7 days.  This is the antibiotic  Take prednisone 2 tabs daily for 5 days.  Continue your albuterol either in your inhaler or in your nebulizer as needed

## 2021-01-26 NOTE — ED Triage Notes (Signed)
Pt states tripped and fell on concrete 3 wks ago. C/o lt rib/shoulder pain, SOB, and has a  productive cough with a large amount of yellow sputum. States wears O2 daily if needed, not wearing it now. Pts o2 sat 88 on arrival to room and increased to 93 after sitting down.

## 2021-01-26 NOTE — Telephone Encounter (Signed)
Reason for Disposition  [1] MILD difficulty breathing (e.g., minimal/no SOB at rest, SOB with walking, pulse <100) AND [2] NEW-onset or WORSE than normal  Answer Assessment - Initial Assessment Questions 1. RESPIRATORY STATUS: "Describe your breathing?" (e.g., wheezing, shortness of breath, unable to speak, severe coughing)      Pt calling in.  I fell 3 weeks ago and bruised my ribs.   It's hard to cough.   My left side is so sore.   Coughing up mucus that is yellow.   No fever. 2. ONSET: "When did this breathing problem begin?"      2 weeks ago when I fell.   I hurt my ribs.   No broken ribs 3. PATTERN "Does the difficult breathing come and go, or has it been constant since it started?"      *No Answer* 4. SEVERITY: "How bad is your breathing?" (e.g., mild, moderate, severe)    - MILD: No SOB at rest, mild SOB with walking, speaks normally in sentences, can lie down, no retractions, pulse < 100.    - MODERATE: SOB at rest, SOB with minimal exertion and prefers to sit, cannot lie down flat, speaks in phrases, mild retractions, audible wheezing, pulse 100-120.    - SEVERE: Very SOB at rest, speaks in single words, struggling to breathe, sitting hunched forward, retractions, pulse > 120      *No Answer* 5. RECURRENT SYMPTOM: "Have you had difficulty breathing before?" If Yes, ask: "When was the last time?" and "What happened that time?"      *No Answer* 6. CARDIAC HISTORY: "Do you have any history of heart disease?" (e.g., heart attack, angina, bypass surgery, angioplasty)      *No Answer* 7. LUNG HISTORY: "Do you have any history of lung disease?"  (e.g., pulmonary embolus, asthma, emphysema)     *No Answer* 8. CAUSE: "What do you think is causing the breathing problem?"      *No Answer* 9. OTHER SYMPTOMS: "Do you have any other symptoms? (e.g., dizziness, runny nose, cough, chest pain, fever)     *No Answer* 10. O2 SATURATION MONITOR:  "Do you use an oxygen saturation monitor (pulse  oximeter) at home?" If Yes, "What is your reading (oxygen level) today?" "What is your usual oxygen saturation reading?" (e.g., 95%)       *No Answer* 11. PREGNANCY: "Is there any chance you are pregnant?" "When was your last menstrual period?"       *No Answer* 12. TRAVEL: "Have you traveled out of the country in the last month?" (e.g., travel history, exposures)       *No Answer*  Protocols used: Breathing Difficulty-A-AH

## 2021-01-26 NOTE — ED Provider Notes (Addendum)
[place]    CSN: [id number] Arrival date & time: 01/26/21  1424      History   Chief Complaint Chief Complaint  Patient presents with   Fall   Shortness of Breath    HPI James [name] is a 61 y.o. male.    Fall Associated symptoms include shortness of breath.  Shortness of Breath Here for a 2 to 3-week history of increased wheezing, increased cough, and increased mucus production.  He has had no fever or chills.  No vomiting or diarrhea.  He is also having some pain on the left side of his chest into his left arm, which has been going on for 2 to 3 weeks  Patient does have a history of pneumonia and of COPD.  He last used his inhaler a couple of hours ago  Past Medical History:  Diagnosis Date   Acute on chronic respiratory failure with hypoxia ([place]) 06/08/2013   CAP (community acquired pneumonia) 05/17/2018   See admit 05/17/18 ? rml  with   covid pcr neg - rx augmentin > f/u cxr in 4-6 weeks is fine unless condition declines    Community acquired pneumonia of right lower lobe of lung 05/17/2018   See admit 05/17/18 ? 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   Tick bite 06/03/2018    Patient Active Problem List   Diagnosis Date Noted   Neuropathy 09/29/2020   Obesity (BMI 30.0-34.9) 04/19/2020   Bronchiectasis ([place]) 09/24/2019   Type 2 diabetes mellitus without complication, without long-term current use of insulin ([place]) 07/24/2019   Other atopic dermatitis 05/01/2019   Chronic respiratory failure with hypoxia ([place]) 04/23/2019   Intellectual disability 04/23/2019   History of alcohol use 11/23/2018   Hypertension 05/17/2016   History of  tobacco abuse 04/06/2016   COPD mixed type ([place]) 03/26/2016    Past Surgical History:  Procedure Laterality Date   VIDEO ASSISTED THORACOSCOPY (VATS)/THOROCOTOMY Left 06/11/2013   Procedure: VIDEO ASSISTED THORACOSCOPY (VATS)/THOROCOTOMY;  Surgeon: Ivin Poot, MD;  Location: [place];  Service: Thoracic;  Laterality: Left;   VIDEO BRONCHOSCOPY N/A 06/11/2013   Procedure: VIDEO BRONCHOSCOPY;  Surgeon: Ivin Poot, MD;  Location: [facility];  Service: Thoracic;  Laterality: N/A;   VIDEO BRONCHOSCOPY N/A 08/10/2018   Procedure: VIDEO BRONCHOSCOPY WITH FLUORO;  Surgeon: Candee Furbish, MD;  [facility] END[facility];  Service: Endoscopy;  Laterality: N/A;       Home Medications    Prior to Admission medications   Medication Sig Start Date End Date Taking? Authorizing Provider  doxycycline (VIBRA-TABS) 100 MG tablet Take 1 tablet (100 mg total) by mouth 2 (two) times daily for 7 days. 01/26/21 02/02/21 Yes Javid Kemler, Gwenlyn Perking, MD  predniSONE (DELTASONE) 20 MG tablet Take 2 tablets (40 mg total) by mouth daily with breakfast for 5 days. 01/26/21 01/31/21 Yes Barrett Henle, MD  albuterol (PROVENTIL) (2.5 MG/3ML) 0.083% nebulizer solution TAKE 3 MLS (2.5 MG TOTAL) BY NEBULIZATION IN THE MORNING, AT NOON, AND AT BEDTIME. 12/22/20 12/22/21  [name], MD  albuterol (VENTOLIN HFA)  108 (90 Base) MCG/ACT inhaler Inhale 2 puffs into the lungs every 4 (four) hours as needed. 12/22/20   [name], MD  Fluticasone-Umeclidin-Vilant (TRELEGY ELLIPTA) 100-62.5-25 MCG/ACT AEPB Inhale 1 puff into the lungs daily. 12/22/20   [name], MD  furosemide (LASIX) 20 MG tablet Take 1 tablet (20 mg total) by mouth daily as needed for edema. 12/22/20 12/22/21  [name], MD  guaiFENesin (MUCINEX) 600 MG 12 hr tablet Take 1 tablet (600 mg total) by mouth 2 (two) times daily as needed for cough or to loosen phlegm. 06/29/20 06/29/21  [name], MD  losartan (COZAAR) 50 MG tablet Take 1 tablet (50  mg total) by mouth daily. 12/22/20   [name], MD  metoprolol succinate (TOPROL-XL) 25 MG 24 hr tablet Take 1 tablet (25 mg total) by mouth daily. 12/22/20 12/22/21  [name], MD  pantoprazole (PROTONIX) 40 MG tablet Take 1 tablet (40 mg total) by mouth daily. 12/22/20   [name], MD  pregabalin (LYRICA) 75 MG capsule Take 1 capsule (75 mg total) by mouth 2 (two) times daily. 12/22/20   [name], MD  Respiratory Therapy Supplies (FLUTTER) DEVI Use 4 times daily 04/27/18   [name], MD    Family History Family History  Problem Relation Age of Onset   Heart disease Father    COPD Sister     Social History Social History   Tobacco Use   Smoking status: Former    Packs/day: 1.50    Years: 40.00    Pack years: 60.00    Types: Cigarettes    Quit date: 2014    Years since quitting: 9.0   Smokeless tobacco: Former    Types: Nurse, children's Use: Never used  Substance Use Topics   Alcohol use: Not Currently    Alcohol/week: 28.0 standard drinks    Types: 28 Cans of beer per week    Comment: 4 beers a night; + jitters with not drinking, denies DTs or seizures   Drug use: Not Currently    Types: Marijuana    Comment: daily; quit using crack and methamphetamine about 5 months ago      Allergies   Gabapentin   Review of Systems Review of Systems  Respiratory:  Positive for shortness of breath.     Physical Exam Triage Vital Signs ED Triage Vitals  Enc Vitals Group     BP 01/26/21 1521 (!) 144/83     Pulse Rate 01/26/21 1521 80     Resp 01/26/21 1521 (!) 22     Temp 01/26/21 1521 97.9 F (36.6 C)     Temp Source 01/26/21 1521 Oral     SpO2 01/26/21 1521 93 %     Weight --      Height --      Head Circumference --      Peak Flow --      Pain Score 01/26/21 1523 0     Pain Loc --      Pain Edu? --      Excl. in [place]? --    No data found.  Updated Vital Signs BP (!) 144/83 (BP Location: Left Arm)    Pulse 80     Temp 97.9 F (36.6 C) (Oral)    Resp (!) 22    SpO2 93%   Visual Acuity Right Eye Distance:   Left Eye Distance:   Bilateral Distance:  Right Eye Near:   Left Eye Near:    Bilateral Near:     Physical Exam Vitals reviewed.  Constitutional:      General: He is not in acute distress.    Appearance: He is not toxic-appearing.  HENT:     Nose: Nose normal.     Mouth/Throat:     Mouth: Mucous membranes are moist.     Pharynx: No oropharyngeal exudate or posterior oropharyngeal erythema.  Eyes:     Extraocular Movements: Extraocular movements intact.     Conjunctiva/sclera: Conjunctivae normal.     Pupils: Pupils are equal, round, and reactive to light.  Cardiovascular:     Rate and Rhythm: Normal rate and regular rhythm.     Heart sounds: No murmur heard. Pulmonary:     Effort: No respiratory distress.     Breath sounds: No stridor. Wheezing (end exp wheezes, fair air movement) present. No rhonchi.  Musculoskeletal:     Cervical back: Neck supple.  Lymphadenopathy:     Cervical: No cervical adenopathy.  Skin:    Coloration: Skin is not jaundiced or pale.  Neurological:     General: No focal deficit present.     Mental Status: He is alert and oriented to person, place, and time.  Psychiatric:        Behavior: Behavior normal.     UC Treatments / Results  Labs (all labs ordered are listed, but only abnormal results are displayed) Labs Reviewed - No data to display  EKG   Radiology DG Chest 2 View  Result Date: 01/26/2021 CLINICAL DATA:  Cough and wheezing for 3 weeks history of pneumonia EXAM: CHEST - 2 VIEW COMPARISON:  Chest radiograph 12/31/2020 FINDINGS: The cardiomediastinal silhouette is stable. There are patchy opacities in the lingula, increased since the prior study. There is no other focal consolidation. There is no pulmonary edema. Remote left-sided rib fractures and left clavicle fracture are again noted. There is no evidence of acute osseous  abnormality. IMPRESSION: Patchy opacities in the lingula are increased since the prior study from 12/31/2020, could reflect atelectasis but is suspicious for developing infection. Electronically Signed   By: Valetta Mole M.D.   On: 01/26/2021 16:34    Procedures Procedures (including critical care time)  Medications Ordered in UC Medications - No data to display   Initial Impression / Assessment and Plan / UC Course  I have reviewed the triage vital signs and the nursing notes.  Pertinent labs & imaging results that were available during my care of the patient were reviewed by me and considered in my medical decision making (see chart for details).     Chest x-ray shows "possible developing infection".  Since vitals acceptable here, will treat with oral medication.  I have asked him to proceed to the past little if he began feeling weaker or more short of breath.  He already has plenty of albuterol for nebulization and for an inhaler Final Clinical Impressions(s) / UC Diagnoses   Final diagnoses:  COPD exacerbation [facility])     Discharge Instructions      Your chest x-ray showed possible early pneumonia.  Take doxycycline 100 mg 1 tab twice daily for 7 days.  This is the antibiotic  Take prednisone 2 tabs daily for 5 days.  Continue your albuterol either in your inhaler or in your nebulizer as needed     ED Prescriptions     Medication Sig Dispense Auth. Provider   doxycycline (VIBRA-TABS) 100 MG tablet  Take 1 tablet (100 mg total) by mouth 2 (two) times daily for 7 days. 14 tablet Amiracle Neises, Gwenlyn Perking, MD   predniSONE (DELTASONE) 20 MG tablet Take 2 tablets (40 mg total) by mouth daily with breakfast for 5 days. 10 tablet Windy Carina Gwenlyn Perking, MD      PDMP not reviewed this encounter.   Barrett Henle, MD 01/26/21 1652    Barrett Henle, MD 01/26/21 [phone]

## 2021-01-26 NOTE — Patient Outreach (Signed)
Medicaid Managed Care   Nurse Care Manager Note  01/26/2021 Name:  James [name] MRN:  [id number] DOB:  1960/07/02  James [name] is an 61 y.o. year old male who is [name] primary patient of James Gaskins Burnett Harry, MD.  The [facility] Managed Care Coordination team was consulted for assistance with:    COPD  James [name] was given information about Medicaid Managed Care Coordination team services today. James [name] Patient agreed to services and verbal consent obtained.  Engaged with patient by telephone for follow up visit in response to provider referral for case management and/or care coordination services.   Assessments/Interventions:  Review of past medical history, allergies, medications, health status, including review of consultants reports, laboratory and other test data, was performed as part of comprehensive evaluation and provision of chronic care management services.  SDOH (Social Determinants of Health) assessments and interventions performed: SDOH Interventions    Flowsheet Row Most Recent Value  SDOH Interventions   Food Insecurity Interventions Intervention Not Indicated       Care Plan  Allergies  Allergen Reactions   Gabapentin Other (See Comments)    hallucinations    Medications Reviewed Today     Reviewed by James Montane, RN (Registered Nurse) on 01/26/21 at 61  Med List Status: <None>   Medication Order Taking? Sig Documenting Provider Last Dose Status Informant  albuterol (PROVENTIL) (2.5 MG/3ML) 0.083% nebulizer solution [id number] Yes TAKE 3 MLS (2.5 MG TOTAL) BY NEBULIZATION IN THE MORNING, AT NOON, AND AT BEDTIME. James Stain, MD Taking Active            Med Note (James [name]   Thu Dec 25, 2020 10:08 AM) Only using at bedtime  albuterol (VENTOLIN HFA) 108 (90 Base) MCG/ACT inhaler [id number] Yes Inhale 2 puffs into the lungs every 4 (four) hours as needed. James Stain, MD Taking Active   Fluticasone-Umeclidin-Vilant [facility] ELLIPTA)  100-62.5-25 MCG/ACT AEPB [id number] Yes Inhale 1 puff into the lungs daily. James Stain, MD Taking Active   furosemide (LASIX) 20 MG tablet [id number] Yes Take 1 tablet (20 mg total) by mouth daily as needed for edema. James Stain, MD Taking Active   guaiFENesin (MUCINEX) 600 MG 12 hr tablet [id number] Yes Take 1 tablet (600 mg total) by mouth 2 (two) times daily as needed for cough or to loosen phlegm. James Bi, MD Taking Active   HYDROcodone-acetaminophen [facility]) 5-325 MG tablet [id number] No Take 1 tablet by mouth every 6 (six) hours as needed for moderate pain or severe pain.  Patient not taking: Reported on 9/37/3428   James Cure, DO Not Taking Active            Med Note (James [name]   Mon Jan 26, 2021 11:02 AM) Ran out  ibuprofen (ADVIL) 800 MG tablet [id number] No Take 1 tablet (800 mg total) by mouth every 6 (six) hours as needed for mild pain.  Patient not taking: Reported on 02/07/5595   James Cure, DO Not Taking Active            Med Note (James [name]   Mon Jan 26, 2021 11:02 AM) Ran out  losartan (COZAAR) 50 MG tablet [id number] Yes Take 1 tablet (50 mg total) by mouth daily. James Stain, MD Taking Active   metoprolol succinate (TOPROL-XL) 25 MG 24 hr tablet [id number] Yes Take 1 tablet (25 mg total) by mouth daily. James Stain, MD Taking Active  pantoprazole (PROTONIX) 40 MG tablet [id number] Yes Take 1 tablet (40 mg total) by mouth daily. James Stain, MD Taking Active   pregabalin (LYRICA) 75 MG capsule [id number] Yes Take 1 capsule (75 mg total) by mouth 2 (two) times daily. James Stain, MD Taking Active            Med Note James [name], James [name]   Thu Dec 25, 2020 10:09 AM) Taking once [name] day  Respiratory Therapy Supplies (FLUTTER) DEVI [id number] Yes Use 4 times daily James Stain, MD Taking Active Self            Patient Active Problem List   Diagnosis Date Noted   Neuropathy 09/29/2020   Obesity (BMI 30.0-34.9) 04/19/2020    Bronchiectasis ([place]) 09/24/2019   Type 2 diabetes mellitus without complication, without long-term current use of insulin ([place]) 07/24/2019   Other atopic dermatitis 05/01/2019   Chronic respiratory failure with hypoxia ([place]) 04/23/2019   Intellectual disability 04/23/2019   History of alcohol use 11/23/2018   Hypertension 05/17/2016   History of tobacco abuse 04/06/2016   COPD mixed type ([place]) 03/26/2016    Conditions to be addressed/monitored per PCP order:  COPD  Care Plan : RN Care Manager Plan of Care  Updates made by James Montane, RN since 01/26/2021 12:00 AM     Problem: Chronic Disease Management needs in patient with COPD   Priority: High     Long-Range Goal: Plan of care established for for Chronic Disease management for patient with COPD   Start Date: 11/13/2020  Expected End Date: 03/03/2021  Priority: High  Note:   Current Barriers:  Care Coordination needs related to Limited access to food  Chronic Disease Management support and education needs related to COPD [name] fell 3 weeks ago and bruised his ribs. He is has pain with coughing, SOB, and wheezing. He is concerned he may have pneumonia. He reports having and taking all of his medications, his sister will pick up needed refills today. Continues using Nebulizer only at bedtime.   RNCM Clinical Goal(s):  Patient will verbalize understanding of plan for management of COPD as evidenced by taking all medications as prescribed, attending all scheduled appointments and contacting provider with questions and concerns take all medications exactly as prescribed and will call provider for medication related questions as evidenced by refilling medications and taking as prescribed    attend all scheduled medical appointments: 03/23/21 @ 10am with [name] as evidenced by provider notes in EMR        demonstrate improved adherence to prescribed treatment plan for COPD as evidenced by taking all medications as prescribed,  attending all scheduled appointments and contacting provider with questions and concerns continue to work with RN Care Manager and/or Social Worker to address care management and care coordination needs related to COPD as evidenced by adherence to CM Team Scheduled appointments     through collaboration with RN Care manager, provider, and care team.   Interventions: Inter-disciplinary care team collaboration (see longitudinal plan of care) Evaluation of current treatment plan related to  self management and patient's adherence to plan as established by provider RNCM assisted in calling PCP office for an appointment-the office is moving this week, patient can have [name] virtual appointment or go to Urgent Care RNCM contacted patient's sister to see if she can take patient to Urgent Care Advised patient to call 911 if he experiences worsening of symptoms before his sister arrives to take him to  Urgent Care RNCM will follow up with patient tomorrow   SDOH Barriers (Status:  Goal Met. Patient interviewed and SDOH assessment performed        SDOH Interventions    Flowsheet Row Most Recent Value  SDOH Interventions   Food Insecurity Interventions Other (Comment)  [RNCM called [employer] for assistance with food benefits application. Patient should recieve [name] call within 24hrs.]     Patient interviewed and appropriate assessments performed Discussed plans with patient for ongoing care management follow up and provided patient with direct contact information for care management team-   COPD: (Status: Goal on Track (progressing): YES.) Long Term Goal  Reviewed medications with patient, including use of prescribed maintenance and rescue inhalers, and provided instruction on medication management and the importance of adherence Advised patient to use nebulizer as directed, three times daily Provided instruction about proper use of medications used for management of COPD including  inhalers Discussed the importance of adequate rest and management of fatigue with COPD Assessed social determinant of health barriers Advised patient to support his chest with [name] pillow when coughing   Patient Goals/Self-Care Activities: Take medications as prescribed   Attend all scheduled provider appointments Call pharmacy for medication refills 3-7 days in advance of running out of medications Perform all self care activities independently  Call provider office for new concerns or questions  - avoid second hand smoke - limit outdoor activity during cold weather - keep follow-up appointments: 03/23/21 @ 10am with [name] - eat healthy/prescribed diet: DASH - get at least 7 to 8 hours of sleep at night       Follow Up:  Patient agrees to Care Plan and Follow-up.  Plan: The Managed Medicaid care management team will reach out to the patient again over the next 1 days.  Date/time of next scheduled RN care management/care coordination outreach:  01/27/21 @ 11:15am  [name] RN, BSN [place] RN Care Coordinator

## 2021-01-26 NOTE — Telephone Encounter (Signed)
°  Chief Complaint: Has bruised ribs from a fall 2 weeks ago. Symptoms: shortness of breath and coughing up yellow sputum Frequency: For 2 weeks but the shortness of breath is worse Pertinent Negatives: Patient denies fever or broken ribs.   Only bruised. Disposition: [] ED /[x] Urgent Care (no appt availability in office) / [] Appointment(In office/virtual)/ []  [facility]/ [] Home Care/ [] Refused Recommended Disposition /[] [facility]/ []  Follow-up with PCP Additional Notes: Since [facility] is moving their office this week only virtual visits are available.   Case Manager was on line with [place] and was concerned about his breathing so it was decided for him to go to the urgent care.   He has a family member that can take him.   Pt was agreeable to this plan.

## 2021-01-26 NOTE — Patient Instructions (Addendum)
Visit Information  James [name] was given information about Medicaid Managed Care team care coordination services as a part of their Healthy [facility] Medicaid benefit. James [name] verbally consented to engagement with the [name] Managed Care team.   If you are experiencing a medical emergency, please call 911 or report to your local emergency department or urgent care.   If you have a non-emergency medical problem during routine business hours, please contact your provider's office and ask to speak with a nurse.   For questions related to your Healthy [facility] health plan, please call: [phone] or visit the homepage here: [email or web address]  If you would like to schedule transportation through your Healthy [facility] plan, please call the following number at least 2 days in advance of your appointment: [phone]  Call the [place] at [phone], at any time, 24 hours a day, 7 days a week. If you are in danger or need immediate medical attention call 911.  If you would like help to quit smoking, call 1-800-QUIT-NOW 534-822-6492) OR Espaol: 1-855-Djelo-Ya (3-976-734-1937) o para ms informacin haga clic aqu or Text READY to 200-400 to register via text  James [name],   Please see education materials related to COPD provided as print materials.   The patient verbalized understanding of instructions provided today and agreed to receive a mailed copy of patient instruction and/or educational materials.  Telephone follow up appointment with Managed Medicaid care management team member scheduled for:  James Joiner RN, BSN [name]   Following is a copy of your plan of care:  Care Plan : RN Care Manager Plan of Care  Updates made by [name], RN since 01/26/2021 12:00 AM     Problem: Chronic Disease Management needs in patient with COPD   Priority: High      Long-Range Goal: Plan of care established for for Chronic Disease management for patient with COPD   Start Date: 11/13/2020  Expected End Date: 03/03/2021  Priority: High  Note:   Current Barriers:  Care Coordination needs related to Limited access to food  Chronic Disease Management support and education needs related to COPD James [name] fell 3 weeks ago and bruised his ribs. He is has pain with coughing, SOB, and wheezing. He is concerned he may have pneumonia. He reports having and taking all of his medications, his sister will pick up needed refills today. Continues using Nebulizer only at bedtime.   RNCM Clinical Goal(s):  Patient will verbalize understanding of plan for management of COPD as evidenced by taking all medications as prescribed, attending all scheduled appointments and contacting provider with questions and concerns take all medications exactly as prescribed and will call provider for medication related questions as evidenced by refilling medications and taking as prescribed    attend all scheduled medical appointments: 03/23/21 @ 10am with [name] as evidenced by provider notes in EMR        demonstrate improved adherence to prescribed treatment plan for COPD as evidenced by taking all medications as prescribed, attending all scheduled appointments and contacting provider with questions and concerns continue to work with RN Care Manager and/or Social Worker to address care management and care coordination needs related to COPD as evidenced by adherence to CM Team Scheduled appointments     through collaboration with RN Care manager, provider, and care team.   Interventions: Inter-disciplinary care team collaboration (see longitudinal plan of care) Evaluation of current treatment  plan related to  self management and patient's adherence to plan as established by provider RNCM assisted in calling PCP office for an appointment-the office is moving this week, patient can  have a virtual appointment or go to Urgent Care RNCM contacted patient's sister to see if she can take patient to Urgent Care Advised patient to call 911 if he experiences worsening of symptoms before his sister arrives to take him to Urgent Care RNCM will follow up with patient tomorrow   SDOH Barriers (Status:  Goal Met. Patient interviewed and SDOH assessment performed        SDOH Interventions    Flowsheet Row Most Recent Value  SDOH Interventions   Food Insecurity Interventions Other (Comment)  [RNCM called Food and Nutrition for assistance with food benefits application. Patient should recieve a call within 24hrs.]     Patient interviewed and appropriate assessments performed Discussed plans with patient for ongoing care management follow up and provided patient with direct contact information for care management team-   COPD: (Status: Goal on Track (progressing): YES.) Long Term Goal  Reviewed medications with patient, including use of prescribed maintenance and rescue inhalers, and provided instruction on medication management and the importance of adherence Advised patient to use nebulizer as directed, three times daily Provided instruction about proper use of medications used for management of COPD including inhalers Discussed the importance of adequate rest and management of fatigue with COPD Assessed social determinant of health barriers Advised patient to support his chest with a pillow when coughing   Patient Goals/Self-Care Activities: Take medications as prescribed   Attend all scheduled provider appointments Call pharmacy for medication refills 3-7 days in advance of running out of medications Perform all self care activities independently  Call provider office for new concerns or questions  - avoid second hand smoke - limit outdoor activity during cold weather - keep follow-up appointments: 03/23/21 @ 10am with [name] - eat healthy/prescribed diet: DASH -  get at least 7 to 8 hours of sleep at night

## 2021-01-27 ENCOUNTER — Other Ambulatory Visit: Payer: Self-pay

## 2021-01-27 ENCOUNTER — Other Ambulatory Visit: Payer: Self-pay | Admitting: *Deleted

## 2021-01-27 NOTE — Patient Instructions (Signed)
Visit Information  James [name] was given information about Medicaid Managed Care team care coordination services as a part of their Healthy [facility] Medicaid benefit. James [name] verbally consented to engagement with the [facility] Managed Care team.   If you are experiencing a medical emergency, please call 911 or report to your local emergency department or urgent care.   If you have a non-emergency medical problem during routine business hours, please contact your provider's office and ask to speak with a nurse.   For questions related to your Healthy [facility] health plan, please call: [phone] or visit the homepage here: [email or web address]  If you would like to schedule transportation through your Healthy [facility] plan, please call the following number at least 2 days in advance of your appointment: [phone]  Call the [place] at [phone], at any time, 24 hours a day, 7 days a week. If you are in danger or need immediate medical attention call 911.  If you would like help to quit smoking, call 1-800-QUIT-NOW (502) 706-5962) OR Espaol: 1-855-Djelo-Ya (3-532-992-4268) o para ms informacin haga clic aqu or Text READY to 200-400 to register via text  James [name],   Please see education materials related to pneumonia provided as print materials.   The patient verbalized understanding of instructions provided today and agreed to receive a mailed copy of patient instruction and/or educational materials.  Telephone follow up appointment with Managed Medicaid care management team member scheduled for:02/10/21 @ 10:30am  [name] RN, BSN [place] RN Care Coordinator   Following is a copy of your plan of care:  Care Plan : RN Care Manager Plan of Care  Updates made by [name], RN since 01/27/2021 12:00 AM     Problem: Chronic Disease Management needs in patient with  COPD   Priority: High     Long-Range Goal: Plan of care established for for Chronic Disease management for patient with COPD   Start Date: 11/13/2020  Expected End Date: 03/03/2021  Priority: High  Note:   Current Barriers:  Care Coordination needs related to Limited access to food  Chronic Disease Management support and education needs related to COPD James [name] was seen at Urgent Care last evening. He was unable to get prescribed medications and needs assistance having those medications transferred to [name].  RNCM Clinical Goal(s):  Patient will verbalize understanding of plan for management of COPD as evidenced by taking all medications as prescribed, attending all scheduled appointments and contacting provider with questions and concerns take all medications exactly as prescribed and will call provider for medication related questions as evidenced by refilling medications and taking as prescribed    attend all scheduled medical appointments: 03/23/21 @ 10am with [name] as evidenced by provider notes in EMR        demonstrate improved adherence to prescribed treatment plan for COPD as evidenced by taking all medications as prescribed, attending all scheduled appointments and contacting provider with questions and concerns continue to work with RN Care Manager and/or Social Worker to address care management and care coordination needs related to COPD as evidenced by adherence to CM Team Scheduled appointments     through collaboration with RN Care manager, provider, and care team.   Interventions: Inter-disciplinary care team collaboration (see longitudinal plan of care) Evaluation of current treatment plan related to  self management and patient's adherence to plan as established by provider Advised patient to call 911  if he experiences worsening of symptoms  RNCM called and transferred new prescriptions to [place] patient to have  his sister pick them up today and start taking as prescribed Reviewed deep breathing exercises and instructed patient to support his chest/abdomen with a pillow when coughing   SDOH Barriers (Status:  Goal Met. Patient interviewed and SDOH assessment performed        SDOH Interventions    Flowsheet Row Most Recent Value  SDOH Interventions   Food Insecurity Interventions Other (Comment)  [RNCM called Food and Nutrition for assistance with food benefits application. Patient should recieve a call within 24hrs.]     Patient interviewed and appropriate assessments performed Discussed plans with patient for ongoing care management follow up and provided patient with direct contact information for care management team-   COPD: (Status: Goal on Track (progressing): YES.) Long Term Goal  Reviewed medications with patient, including use of prescribed maintenance and rescue inhalers, and provided instruction on medication management and the importance of adherence Advised patient to use nebulizer as directed, three times daily Provided instruction about proper use of medications used for management of COPD including inhalers Discussed the importance of adequate rest and management of fatigue with COPD Assessed social determinant of health barriers Advised patient to support his chest with a pillow when coughing   Patient Goals/Self-Care Activities: Take medications as prescribed   Attend all scheduled provider appointments Call pharmacy for medication refills 3-7 days in advance of running out of medications Perform all self care activities independently  Call provider office for new concerns or questions  - avoid second hand smoke - limit outdoor activity during cold weather - keep follow-up appointments: 03/23/21 @ 10am with [name] - eat healthy/prescribed diet: DASH - get at least 7 to 8 hours of sleep at night

## 2021-01-27 NOTE — Patient Outreach (Signed)
Medicaid Managed Care   Nurse Care Manager Note  01/27/2021 Name:  James [name] MRN:  [id number] DOB:  May 30, 1960  James [name] is an 61 y.o. year old male who is a primary patient of James Gaskins Burnett Harry, MD.  The [facility] Managed Care Coordination team was consulted for assistance with:    COPD  [name] was given information about Medicaid Managed Care Coordination team services today. [name] Patient agreed to services and verbal consent obtained.  Engaged with patient by telephone for follow up visit in response to provider referral for case management and/or care coordination services.   Assessments/Interventions:  Review of past medical history, allergies, medications, health status, including review of consultants reports, laboratory and other test data, was performed as part of comprehensive evaluation and provision of chronic care management services.  SDOH (Social Determinants of Health) assessments and interventions performed:   Care Plan  Allergies  Allergen Reactions   Gabapentin Other (See Comments)    hallucinations    Medications Reviewed Today     Reviewed by [name], RN (Registered Nurse) on 01/27/21 at 44  Med List Status: <None>   Medication Order Taking? Sig Documenting Provider Last Dose Status Informant  albuterol (PROVENTIL) (2.5 MG/3ML) 0.083% nebulizer solution [id number] No TAKE 3 MLS (2.5 MG TOTAL) BY NEBULIZATION IN THE MORNING, AT NOON, AND AT BEDTIME. [name], MD Taking Active            Med Note (Gretel Cantu A   Thu Dec 25, 2020 10:08 AM) Only using at bedtime  albuterol (VENTOLIN HFA) 108 (90 Base) MCG/ACT inhaler [id number] No Inhale 2 puffs into the lungs every 4 (four) hours as needed. [name], MD Taking Active   doxycycline (VIBRA-TABS) 100 MG tablet [id number]  Take 1 tablet (100 mg total) by mouth 2 (two) times daily for 7 days. Barrett Henle, MD  Active   Fluticasone-Umeclidin-Vilant (TRELEGY  ELLIPTA) 100-62.5-25 MCG/ACT AEPB [id number] No Inhale 1 puff into the lungs daily. [name], MD Taking Active   furosemide (LASIX) 20 MG tablet [id number] No Take 1 tablet (20 mg total) by mouth daily as needed for edema. [name], MD Taking Active   guaiFENesin (MUCINEX) 600 MG 12 hr tablet [id number] No Take 1 tablet (600 mg total) by mouth 2 (two) times daily as needed for cough or to loosen phlegm. [name], MD Taking Active   losartan (COZAAR) 50 MG tablet [id number] No Take 1 tablet (50 mg total) by mouth daily. [name], MD Taking Active   metoprolol succinate (TOPROL-XL) 25 MG 24 hr tablet [id number] No Take 1 tablet (25 mg total) by mouth daily. [name], MD Taking Active   pantoprazole (PROTONIX) 40 MG tablet [id number] No Take 1 tablet (40 mg total) by mouth daily. [name], MD Taking Active   predniSONE (DELTASONE) 20 MG tablet [id number]  Take 2 tablets (40 mg total) by mouth daily with breakfast for 5 days. Barrett Henle, MD  Active   pregabalin (LYRICA) 75 MG capsule [id number] No Take 1 capsule (75 mg total) by mouth 2 (two) times daily. [name], MD Taking Active            Med Note [name], Kendrick Haapala A   Thu Dec 25, 2020 10:09 AM) Taking once a day  Respiratory Therapy Supplies (FLUTTER) DEVI [id number] No Use 4 times daily [name], MD Taking Active Self  Patient Active Problem List   Diagnosis Date Noted   Neuropathy 09/29/2020   Obesity (BMI 30.0-34.9) 04/19/2020   Bronchiectasis ([place]) 09/24/2019   Type 2 diabetes mellitus without complication, without long-term current use of insulin ([place]) 07/24/2019   Other atopic dermatitis 05/01/2019   Chronic respiratory failure with hypoxia ([place]) 04/23/2019   Intellectual disability 04/23/2019   History of alcohol use 11/23/2018   Hypertension 05/17/2016   History of tobacco abuse 04/06/2016   COPD mixed type ([place]) 03/26/2016    Conditions to be  addressed/monitored per PCP order:  COPD  Care Plan : RN Care Manager Plan of Care  Updates made by [name], RN since 01/27/2021 12:00 AM     Problem: Chronic Disease Management needs in patient with COPD   Priority: High     Long-Range Goal: Plan of care established for for Chronic Disease management for patient with COPD   Start Date: 11/13/2020  Expected End Date: 03/03/2021  Priority: High  Note:   Current Barriers:  Care Coordination needs related to Limited access to food  Chronic Disease Management support and education needs related to COPD [name] was seen at Urgent Care last evening. He was unable to get prescribed medications and needs assistance having those medications transferred to [place].  RNCM Clinical Goal(s):  Patient will verbalize understanding of plan for management of COPD as evidenced by taking all medications as prescribed, attending all scheduled appointments and contacting provider with questions and concerns take all medications exactly as prescribed and will call provider for medication related questions as evidenced by refilling medications and taking as prescribed    attend all scheduled medical appointments: 03/23/21 @ 10am with [name] as evidenced by provider notes in EMR        demonstrate improved adherence to prescribed treatment plan for COPD as evidenced by taking all medications as prescribed, attending all scheduled appointments and contacting provider with questions and concerns continue to work with RN Care Manager and/or Social Worker to address care management and care coordination needs related to COPD as evidenced by adherence to CM Team Scheduled appointments     through collaboration with RN Care manager, provider, and care team.   Interventions: Inter-disciplinary care team collaboration (see longitudinal plan of care) Evaluation of current treatment plan related to  self management and patient's  adherence to plan as established by provider Advised patient to call 911 if he experiences worsening of symptoms  RNCM called and transferred new prescriptions to [facility] and [place] patient to have his sister pick them up today and start taking as prescribed Reviewed deep breathing exercises and instructed patient to support his chest/abdomen with a pillow when coughing   SDOH Barriers (Status:  Goal Met. Patient interviewed and SDOH assessment performed        SDOH Interventions    Flowsheet Row Most Recent Value  SDOH Interventions   Food Insecurity Interventions Other (Comment)  [RNCM called Food and Nutrition for assistance with food benefits application. Patient should recieve a call within 24hrs.]     Patient interviewed and appropriate assessments performed Discussed plans with patient for ongoing care management follow up and provided patient with direct contact information for care management team-   COPD: (Status: Goal on Track (progressing): YES.) Long Term Goal  Reviewed medications with patient, including use of prescribed maintenance and rescue inhalers, and provided instruction on medication management and the importance of adherence Advised patient to use nebulizer as  directed, three times daily Provided instruction about proper use of medications used for management of COPD including inhalers Discussed the importance of adequate rest and management of fatigue with COPD Assessed social determinant of health barriers Advised patient to support his chest with a pillow when coughing   Patient Goals/Self-Care Activities: Take medications as prescribed   Attend all scheduled provider appointments Call pharmacy for medication refills 3-7 days in advance of running out of medications Perform all self care activities independently  Call provider office for new concerns or questions  - avoid second hand smoke - limit outdoor activity during cold  weather - keep follow-up appointments: 03/23/21 @ 10am with [name] - eat healthy/prescribed diet: DASH - get at least 7 to 8 hours of sleep at night       Follow Up:  Patient agrees to Care Plan and Follow-up.  Plan: The Managed Medicaid care management team will reach out to the patient again over the next 14 days.  Date/time of next scheduled RN care management/care coordination outreach:  02/10/21 @ 10:30am  [name] RN, BSN [place] RN Care Coordinator

## 2021-02-01 ENCOUNTER — Inpatient Hospital Stay (HOSPITAL_COMMUNITY)
Admission: EM | Admit: 2021-02-01 | Discharge: 2021-02-04 | DRG: 190 | Disposition: A | Discharge status: Home / Self Care | Payer: Medicaid Other | Attending: Internal Medicine | Admitting: Internal Medicine

## 2021-02-01 ENCOUNTER — Emergency Department (HOSPITAL_COMMUNITY): Payer: Medicaid Other

## 2021-02-01 ENCOUNTER — Encounter (HOSPITAL_COMMUNITY): Payer: Self-pay

## 2021-02-01 ENCOUNTER — Other Ambulatory Visit: Payer: Self-pay

## 2021-02-01 DIAGNOSIS — U071 COVID-19: Secondary | ICD-10-CM | POA: Diagnosis not present

## 2021-02-01 DIAGNOSIS — Z888 Allergy status to other drugs, medicaments and biological substances status: Secondary | ICD-10-CM

## 2021-02-01 DIAGNOSIS — E119 Type 2 diabetes mellitus without complications: Secondary | ICD-10-CM | POA: Diagnosis present

## 2021-02-01 DIAGNOSIS — J440 Chronic obstructive pulmonary disease with acute lower respiratory infection: Secondary | ICD-10-CM | POA: Diagnosis not present

## 2021-02-01 DIAGNOSIS — R0789 Other chest pain: Secondary | ICD-10-CM | POA: Diagnosis present

## 2021-02-01 DIAGNOSIS — R0602 Shortness of breath: Secondary | ICD-10-CM | POA: Diagnosis not present

## 2021-02-01 DIAGNOSIS — Z825 Family history of asthma and other chronic lower respiratory diseases: Secondary | ICD-10-CM | POA: Diagnosis not present

## 2021-02-01 DIAGNOSIS — Z8249 Family history of ischemic heart disease and other diseases of the circulatory system: Secondary | ICD-10-CM | POA: Diagnosis not present

## 2021-02-01 DIAGNOSIS — B0223 Postherpetic polyneuropathy: Secondary | ICD-10-CM | POA: Diagnosis not present

## 2021-02-01 DIAGNOSIS — K219 Gastro-esophageal reflux disease without esophagitis: Secondary | ICD-10-CM | POA: Diagnosis present

## 2021-02-01 DIAGNOSIS — R0781 Pleurodynia: Secondary | ICD-10-CM | POA: Diagnosis not present

## 2021-02-01 DIAGNOSIS — J449 Chronic obstructive pulmonary disease, unspecified: Secondary | ICD-10-CM | POA: Diagnosis present

## 2021-02-01 DIAGNOSIS — J9811 Atelectasis: Secondary | ICD-10-CM | POA: Diagnosis not present

## 2021-02-01 DIAGNOSIS — Z7951 Long term (current) use of inhaled steroids: Secondary | ICD-10-CM | POA: Diagnosis not present

## 2021-02-01 DIAGNOSIS — J9621 Acute and chronic respiratory failure with hypoxia: Secondary | ICD-10-CM | POA: Diagnosis present

## 2021-02-01 DIAGNOSIS — Z8616 Personal history of COVID-19: Secondary | ICD-10-CM

## 2021-02-01 DIAGNOSIS — J441 Chronic obstructive pulmonary disease with (acute) exacerbation: Secondary | ICD-10-CM | POA: Diagnosis not present

## 2021-02-01 DIAGNOSIS — Z79899 Other long term (current) drug therapy: Secondary | ICD-10-CM | POA: Diagnosis not present

## 2021-02-01 DIAGNOSIS — I10 Essential (primary) hypertension: Secondary | ICD-10-CM | POA: Diagnosis present

## 2021-02-01 DIAGNOSIS — J9611 Chronic respiratory failure with hypoxia: Secondary | ICD-10-CM | POA: Diagnosis not present

## 2021-02-01 DIAGNOSIS — Z9981 Dependence on supplemental oxygen: Secondary | ICD-10-CM

## 2021-02-01 DIAGNOSIS — R0902 Hypoxemia: Secondary | ICD-10-CM | POA: Diagnosis not present

## 2021-02-01 DIAGNOSIS — J189 Pneumonia, unspecified organism: Secondary | ICD-10-CM | POA: Diagnosis not present

## 2021-02-01 DIAGNOSIS — Z902 Acquired absence of lung [part of]: Secondary | ICD-10-CM | POA: Diagnosis not present

## 2021-02-01 DIAGNOSIS — Z87891 Personal history of nicotine dependence: Secondary | ICD-10-CM

## 2021-02-01 DIAGNOSIS — J4489 Other specified chronic obstructive pulmonary disease: Secondary | ICD-10-CM | POA: Diagnosis present

## 2021-02-01 DIAGNOSIS — R000 Tachycardia, unspecified: Secondary | ICD-10-CM | POA: Diagnosis not present

## 2021-02-01 DIAGNOSIS — R509 Fever, unspecified: Secondary | ICD-10-CM | POA: Diagnosis not present

## 2021-02-01 DIAGNOSIS — J9620 Acute and chronic respiratory failure, unspecified whether with hypoxia or hypercapnia: Secondary | ICD-10-CM | POA: Diagnosis present

## 2021-02-01 LAB — BASIC METABOLIC PANEL
Anion gap: 11 (ref 5–15)
BUN: 10 mg/dL (ref 6–20)
CO2: 30 mmol/L (ref 22–32)
Calcium: 9.2 mg/dL (ref 8.9–10.3)
Chloride: 102 mmol/L (ref 98–111)
Creatinine, Ser: 0.8 mg/dL (ref 0.61–1.24)
GFR, Estimated: >60 mL/min (ref >60)
Glucose, Bld: 104 mg/dL — ABNORMAL HIGH (ref 70–99)
Potassium: 3.8 mmol/L (ref 3.5–5.1)
Sodium: 143 mmol/L (ref 135–145)

## 2021-02-01 LAB — CBC WITH DIFFERENTIAL/PLATELET
Abs Immature Granulocytes: 0.03 10*3/uL (ref 0.00–0.07)
Abs Immature Granulocytes: 0.04 10*3/uL (ref 0.00–0.07)
Basophils Absolute: 0 10*3/uL (ref 0.0–0.1)
Basophils Absolute: 0 10*3/uL (ref 0.0–0.1)
Basophils Relative: 0 %
Basophils Relative: 0 %
Eosinophils Absolute: 0.1 10*3/uL (ref 0.0–0.5)
Eosinophils Absolute: 0 10*3/uL (ref 0.0–0.5)
Eosinophils Relative: 1 %
Eosinophils Relative: 0 %
HCT: 44.8 % (ref 39.0–52.0)
HCT: 42.7 % (ref 39.0–52.0)
Hemoglobin: 14.6 g/dL (ref 13.0–17.0)
Hemoglobin: 13.5 g/dL (ref 13.0–17.0)
Immature Granulocytes: 0 %
Immature Granulocytes: 0 %
Lymphocytes Relative: 10 %
Lymphocytes Relative: 4 %
Lymphs Abs: 1 10*3/uL (ref 0.7–4.0)
Lymphs Abs: 0.4 10*3/uL — ABNORMAL LOW (ref 0.7–4.0)
MCH: 30.6 pg (ref 26.0–34.0)
MCH: 30 pg (ref 26.0–34.0)
MCHC: 32.6 g/dL (ref 30.0–36.0)
MCHC: 31.6 g/dL (ref 30.0–36.0)
MCV: 93.9 fL (ref 80.0–100.0)
MCV: 94.9 fL (ref 80.0–100.0)
Monocytes Absolute: 1.4 10*3/uL — ABNORMAL HIGH (ref 0.1–1.0)
Monocytes Absolute: 0.3 10*3/uL (ref 0.1–1.0)
Monocytes Relative: 15 %
Monocytes Relative: 3 %
Neutro Abs: 7.3 10*3/uL (ref 1.7–7.7)
Neutro Abs: 10.9 10*3/uL — ABNORMAL HIGH (ref 1.7–7.7)
Neutrophils Relative %: 74 %
Neutrophils Relative %: 93 %
Platelets: 265 10*3/uL (ref 150–400)
Platelets: 244 10*3/uL (ref 150–400)
RBC: 4.77 MIL/uL (ref 4.22–5.81)
RBC: 4.5 MIL/uL (ref 4.22–5.81)
RDW: 13.4 % (ref 11.5–15.5)
RDW: 13.7 % (ref 11.5–15.5)
WBC: 9.8 10*3/uL (ref 4.0–10.5)
WBC: 11.7 10*3/uL — ABNORMAL HIGH (ref 4.0–10.5)
nRBC: 0 % (ref 0.0–0.2)
nRBC: 0 % (ref 0.0–0.2)

## 2021-02-01 LAB — COMPREHENSIVE METABOLIC PANEL
ALT: 14 U/L (ref 0–44)
AST: 20 U/L (ref 15–41)
Albumin: 3.6 g/dL (ref 3.5–5.0)
Alkaline Phosphatase: 131 U/L — ABNORMAL HIGH (ref 38–126)
Anion gap: 12 (ref 5–15)
BUN: 12 mg/dL (ref 6–20)
CO2: 25 mmol/L (ref 22–32)
Calcium: 8.9 mg/dL (ref 8.9–10.3)
Chloride: 102 mmol/L (ref 98–111)
Creatinine, Ser: 0.9 mg/dL (ref 0.61–1.24)
GFR, Estimated: >60 mL/min (ref >60)
Glucose, Bld: 196 mg/dL — ABNORMAL HIGH (ref 70–99)
Potassium: 4.1 mmol/L (ref 3.5–5.1)
Sodium: 139 mmol/L (ref 135–145)
Total Bilirubin: 0.8 mg/dL (ref 0.3–1.2)
Total Protein: 6.1 g/dL — ABNORMAL LOW (ref 6.5–8.1)

## 2021-02-01 LAB — MAGNESIUM: Magnesium: 2.1 mg/dL (ref 1.7–2.4)

## 2021-02-01 LAB — TROPONIN I (HIGH SENSITIVITY)
Troponin I (High Sensitivity): 12 ng/L (ref <18)
Troponin I (High Sensitivity): 12 ng/L (ref <18)

## 2021-02-01 LAB — D-DIMER, QUANTITATIVE: D-Dimer, Quant: 0.27 ug/mL-FEU (ref 0.00–0.50)

## 2021-02-01 LAB — BRAIN NATRIURETIC PEPTIDE: B Natriuretic Peptide: 40.1 pg/mL (ref 0.0–100.0)

## 2021-02-01 LAB — PROCALCITONIN: Procalcitonin: <0.1 ng/mL

## 2021-02-01 LAB — C-REACTIVE PROTEIN: CRP: 0.7 mg/dL (ref <1.0)

## 2021-02-01 LAB — PHOSPHORUS: Phosphorus: 3.1 mg/dL (ref 2.5–4.6)

## 2021-02-01 LAB — RESP PANEL BY RT-PCR (FLU A&B, COVID) ARPGX2
Influenza A by PCR: NEGATIVE
Influenza B by PCR: NEGATIVE
SARS Coronavirus 2 by RT PCR: POSITIVE — AB

## 2021-02-01 MED ORDER — IPRATROPIUM-ALBUTEROL 0.5-2.5 (3) MG/3ML IN SOLN
3.0000 mL | Freq: Once | RESPIRATORY_TRACT | Status: AC
  Administered 2021-02-01: 3 mL via RESPIRATORY_TRACT
  Filled 2021-02-01: qty 3

## 2021-02-01 MED ORDER — IPRATROPIUM-ALBUTEROL 0.5-2.5 (3) MG/3ML IN SOLN
3.0000 mL | Freq: Once | RESPIRATORY_TRACT | Status: AC
Start: 2021-02-01 — End: 2021-02-01
  Administered 2021-02-01: 3 mL via RESPIRATORY_TRACT
  Filled 2021-02-01: qty 3

## 2021-02-01 MED ORDER — IPRATROPIUM-ALBUTEROL 0.5-2.5 (3) MG/3ML IN SOLN: 3.0000 mL | Freq: Four times a day (QID) | RESPIRATORY_TRACT | Status: DC

## 2021-02-01 MED ORDER — SODIUM CHLORIDE 0.9 % IV SOLN
100.0000 mg | Freq: Two times a day (BID) | INTRAVENOUS | Status: DC
  Administered 2021-02-01: 100 mg via INTRAVENOUS
  Filled 2021-02-01 (×2): qty 100

## 2021-02-01 MED ORDER — METHYLPREDNISOLONE SODIUM SUCC 125 MG IJ SOLR
125.0000 mg | Freq: Once | INTRAMUSCULAR | Status: AC
  Administered 2021-02-01: 125 mg via INTRAVENOUS
  Filled 2021-02-01: qty 2

## 2021-02-01 MED ORDER — ACETAMINOPHEN 650 MG RE SUPP: 650.0000 mg | Freq: Four times a day (QID) | RECTAL | Status: DC | PRN

## 2021-02-01 MED ORDER — ACETAMINOPHEN 325 MG PO TABS: 650.0000 mg | ORAL_TABLET | Freq: Four times a day (QID) | ORAL | Status: DC | PRN

## 2021-02-01 MED ORDER — IPRATROPIUM-ALBUTEROL 0.5-2.5 (3) MG/3ML IN SOLN
3.0000 mL | Freq: Four times a day (QID) | RESPIRATORY_TRACT | Status: DC
  Administered 2021-02-01 – 2021-02-04 (×12): 3 mL via RESPIRATORY_TRACT
  Filled 2021-02-01 (×13): qty 3

## 2021-02-01 MED ORDER — ALBUTEROL SULFATE (2.5 MG/3ML) 0.083% IN NEBU: 2.5000 mg | INHALATION_SOLUTION | RESPIRATORY_TRACT | Status: DC | PRN

## 2021-02-01 MED ORDER — MAGNESIUM SULFATE 2 GM/50ML IV SOLN
2.0000 g | Freq: Once | INTRAVENOUS | Status: AC
  Administered 2021-02-01: 2 g via INTRAVENOUS
  Filled 2021-02-01: qty 50

## 2021-02-01 MED ORDER — BENZONATATE 100 MG PO CAPS: 200.0000 mg | ORAL_CAPSULE | Freq: Three times a day (TID) | ORAL | Status: DC | PRN

## 2021-02-01 MED ORDER — METOPROLOL SUCCINATE ER 25 MG PO TB24
25.0000 mg | ORAL_TABLET | Freq: Every day | ORAL | Status: DC
  Administered 2021-02-01 – 2021-02-04 (×4): 25 mg via ORAL
  Filled 2021-02-01 (×4): qty 1

## 2021-02-01 MED ORDER — METHYLPREDNISOLONE SODIUM SUCC 125 MG IJ SOLR
80.0000 mg | Freq: Four times a day (QID) | INTRAMUSCULAR | Status: DC
  Administered 2021-02-01 – 2021-02-02 (×4): 80 mg via INTRAVENOUS
  Filled 2021-02-01 (×4): qty 2

## 2021-02-01 MED ORDER — DOXYCYCLINE HYCLATE 100 MG PO TABS
100.0000 mg | ORAL_TABLET | Freq: Two times a day (BID) | ORAL | Status: DC
  Administered 2021-02-01 – 2021-02-04 (×6): 100 mg via ORAL
  Filled 2021-02-01 (×6): qty 1

## 2021-02-01 MED ORDER — PANTOPRAZOLE SODIUM 40 MG PO TBEC
40.0000 mg | DELAYED_RELEASE_TABLET | Freq: Every day | ORAL | Status: DC
  Administered 2021-02-01 – 2021-02-04 (×4): 40 mg via ORAL
  Filled 2021-02-01 (×4): qty 1

## 2021-02-01 MED ORDER — LOSARTAN POTASSIUM 50 MG PO TABS
50.0000 mg | ORAL_TABLET | Freq: Every day | ORAL | Status: DC
  Administered 2021-02-01 – 2021-02-04 (×4): 50 mg via ORAL
  Filled 2021-02-01 (×4): qty 1

## 2021-02-01 MED ORDER — MOMETASONE FURO-FORMOTEROL FUM 100-5 MCG/ACT IN AERO
2.0000 | INHALATION_SPRAY | Freq: Two times a day (BID) | RESPIRATORY_TRACT | Status: DC
  Administered 2021-02-01 – 2021-02-04 (×6): 2 via RESPIRATORY_TRACT
  Filled 2021-02-01: qty 8.8

## 2021-02-01 NOTE — H&P (Signed)
History and Physical    PLEASE NOTE THAT DRAGON DI[employer] SOFTWARE WAS USED IN THE CONSTRUCTION OF THIS NOTE.   James [name] QQP:[id number] DOB: 1960/04/13 DOA: 02/01/2021  PCP: [name], MD  Patient coming from: home   I have personally briefly reviewed patient's old medical records in [place]  Chief Complaint: Shortness of breath  HPI: James [name] is a 61 y.o. male with medical history significant for chronic [place] respiratory failure on 2 L continuous nasal cannula in the setting of severe COPD, essential pretension, GERD, who is admitted to [facility] on 02/01/2021 with COPD exacerbation in the setting of COVID-19 infection after presenting from home to [facility] ED complaining of shortness of breath.   The patient reports 6 to 7 days progressive shortness of breath associated with new onset nonproductive cough. Denies any associated orthopnea, PND, or new onset peripheral edema.  He reports associated bilateral chest discomfort that is intermittent, and limited to periods of coughing.  He reports that this intermittent chest discomfort started a few days after the onset of his shortness of breath/cough and is nonexertional, nonpositional, and not reproducible with deep palpation of the anterior chest wall.  He denies any associated diaphoresis, cavitations, nausea, vomiting, presyncope, or syncope.  He notes intermittent expiratory wheezing over the last few days, in the absence of any hemoptysis, new lower extremity erythema, or calf tenderness. Denies any recent trauma, travel, surgical procedures, or periods of prolonged diminished ambulatory status. No recent melena or hematochezia.   Denies any associated subjective fever, chills, rigors, or generalized myalgias. No recent headache, neck stiffness, abdominal pain, diarrhea, or rash.  Acknowledges a history of severe COPD, as well as chronic hypoxic respiratory failure on 2 to 3 L continuous nasal cannula.   Confirms that he is a former smoker, having completely quit smoking approximately 10 years ago after smoking 1 to 1.5 packs/day for at least the preceding 10 years.  Reports good compliance with his outpatient respiratory regimen which includes Trelegy as well as prn albuterol inhaler.  Notes increasing frequency of use of his rescue albuterol inhaler over the last few days, but without any resultant improvement in his breathing with this measure.   In the setting of the symptoms, he presented to his PCP on 01/29/2021, and was prescribed prednisone as well as doxycycline.  He started taking both these medications on 01/30/2021, and after 2 days of compliance with both of these 2 medications, noted no ensuing improvement in the above respiratory symptoms, prompting him to present to [facility] emergency department sooner for further evaluation and management thereof.    ED Course:  Vital signs in the ED were notable for the following: Afebrile; heart rate 10 7-1 14; blood pressure 122/76 - 157/85; respiratory rate 18-21, oxygen saturation 97 to 100% on baseline 2 L nasal cannula.  Labs were notable for the following: BMP notable for the following: Sodium 143, bicarbonate 30, creatinine 0.8.  BNP 40, high-sensitivity troponin I 12.  CBC notable for white blood cell count 9800, hemoglobin 14.6.  COVID-19 PCR positive, will influenza A/B PCR negative.  Imaging and additional notable ED work-up: EKG shows sinus tachycardia with heart rate 106, normal intervals, no evidence of T wave or ST changes, including no evidence of ST elevation.  Chest x-ray shows minimal bibasilar airspace opacities favoring atelectasis, in the absence of evidence of overt infiltrate; no evidence of edema, pleural effusion or pneumothorax.  While in the ED, the following were  administered: Duo nebulizer treatments x2, Solu-Medrol 125 mg IV x1, magnesium sulfate 2 g IV every 2 hours x1 dose.  Subsequently, the patient was admitted to  the med telemetry unit for further evaluation and management of acute COPD exacerbation in the setting of COVID-19 infection.     Review of Systems: As per HPI otherwise 10 point review of systems negative.   Past Medical History:  Diagnosis Date   Acute on chronic respiratory failure with hypoxia ([place]) 06/08/2013   CAP (community acquired pneumonia) 05/17/2018   See admit 05/17/18 ? rml  with   covid pcr neg - rx augmentin > f/u cxr in 4-6 weeks is fine unless condition declines    Community acquired pneumonia of right lower lobe of lung 05/17/2018   See admit 05/17/18 ? 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   Tick bite 06/03/2018    Past Surgical History:  Procedure Laterality Date   LUNG REMOVAL, PARTIAL     VIDEO ASSISTED THORACOSCOPY (VATS)/THOROCOTOMY Left 06/11/2013   Procedure: VIDEO ASSISTED THORACOSCOPY (VATS)/THOROCOTOMY;  Surgeon: Ivin Poot, MD;  Location: [place];  Service: Thoracic;  Laterality: Left;   VIDEO BRONCHOSCOPY N/A 06/11/2013   Procedure: VIDEO BRONCHOSCOPY;  Surgeon: Ivin Poot, MD;  Location: [place];  Service: Thoracic;  Laterality: N/A;   VIDEO BRONCHOSCOPY N/A 08/10/2018   Procedure: VIDEO BRONCHOSCOPY WITH FLUORO;  Surgeon: Candee Furbish, MD;  Location: [facility] ENDOSCOPY;  Service: Endoscopy;  Laterality: N/A;    Social History:  reports that he quit smoking about 9 years ago. His smoking use included cigarettes. He has a 60.00 pack-year smoking history. He has quit using smokeless tobacco.  His smokeless tobacco use included chew. He reports that he does not currently use alcohol after a past usage of about 28.0 standard drinks per  week. He reports that he does not currently use drugs after having used the following drugs: Marijuana.   Allergies  Allergen Reactions   Gabapentin Other (See Comments)    hallucinations   Metformin And Related Nausea Only    Pt states "it doesn't make him feel good"    Family History  Problem Relation Age of Onset   Heart disease Father    COPD Sister     Family history reviewed and not pertinent    Prior to Admission medications   Medication Sig Start Date End Date Taking? Authorizing Provider  albuterol (PROVENTIL) (2.5 MG/3ML) 0.083% nebulizer solution TAKE 3 MLS (2.5 MG TOTAL) BY NEBULIZATION IN THE MORNING, AT NOON, AND AT BEDTIME. Patient taking differently: Take 2.5 mg by nebulization in the morning, at noon, and at bedtime. 12/22/20 12/22/21 Yes [name], MD  albuterol (VENTOLIN HFA) 108 (90 Base) MCG/ACT inhaler Inhale 2 puffs into the lungs every 4 (four) hours as needed. Patient taking differently: Inhale 2 puffs into the lungs every 4 (four) hours as needed for wheezing. 12/22/20  Yes [name], MD  doxycycline (VIBRA-TABS) 100 MG tablet Take 1 tablet (100 mg total) by mouth 2 (two) times daily for 7 days. 01/26/21 02/03/21 Yes Banister,  Gwenlyn Perking, MD  Fluticasone-Umeclidin-Vilant (TRELEGY ELLIPTA) 100-62.5-25 MCG/ACT AEPB Inhale 1 puff into the lungs daily. 12/22/20  Yes [name], MD  furosemide (LASIX) 20 MG tablet Take 1 tablet (20 mg total) by mouth daily as needed for edema. 12/22/20 12/22/21 Yes [name], MD  guaiFENesin (MUCINEX) 600 MG 12 hr tablet Take 1 tablet (600 mg total) by mouth 2 (two) times daily as needed for cough or to loosen phlegm. 06/29/20 06/29/21 Yes [name], MD  ibuprofen (ADVIL) 600 MG tablet Take 600 mg by mouth every 6 (six) hours as needed for moderate pain.   Yes [provider]  losartan (COZAAR) 50 MG tablet Take 1 tablet (50 mg total) by mouth daily. 12/22/20  Yes [name], MD  metoprolol  succinate (TOPROL-XL) 25 MG 24 hr tablet Take 1 tablet (25 mg total) by mouth daily. 12/22/20 12/22/21 Yes [name], MD  pantoprazole (PROTONIX) 40 MG tablet Take 1 tablet (40 mg total) by mouth daily. 12/22/20  Yes [name], MD  predniSONE (DELTASONE) 20 MG tablet Take 2 tablets (40 mg total) by mouth daily with breakfast for 5 days. 01/26/21 02/01/21 Yes Banister, Gwenlyn Perking, MD  pregabalin (LYRICA) 75 MG capsule Take 1 capsule (75 mg total) by mouth 2 (two) times daily. 12/22/20  Yes [name], MD  Respiratory Therapy Supplies (FLUTTER) DEVI Use 4 times daily 04/27/18   [name], MD     Objective    Physical Exam: Vitals:   02/01/21 0135 02/01/21 0135 02/01/21 0200 02/01/21 0215  BP: 122/76  126/87 (!) 157/85  Pulse: (!) 111  (!) 101 (!) 107  Resp: 20  18 (!) 21  Temp: 98.4 F (36.9 C)     TempSrc: Oral     SpO2: 100% 99% 98% 97%  Weight: 98.9 kg     Height: 5\' 9"  (1.753 m)       General: appears to be stated age; alert, oriented; mildly increased work of breathing noted Skin: warm, dry, no rash Head:  AT/[place] Mouth:  Oral mucosa membranes appear moist, normal dentition Neck: supple; trachea midline Heart:  RRR; did not appreciate any M/R/G Lungs: CTAB, did not appreciate any wheezes, rales, or rhonchi Abdomen: + BS; soft, ND, NT Vascular: 2+ pedal pulses b/l; 2+ radial pulses b/l Extremities: no peripheral edema, no muscle wasting Neuro: strength and sensation intact in upper and lower extremities b/l    Labs on Admission: I have personally reviewed following labs and imaging studies  CBC: Recent Labs  Lab 02/01/21 0142  WBC 9.8  NEUTROABS 7.3  HGB 14.6  HCT 44.8  MCV 93.9  PLT 350   Basic Metabolic Panel: Recent Labs  Lab 02/01/21 0142  NA 143  K 3.8  CL 102  CO2 30  GLUCOSE 104*  BUN 10  CREATININE 0.80  CALCIUM 9.2   GFR: Estimated Creatinine Clearance: 115.3 mL/min (by C-G formula based on SCr of 0.8 mg/dL). Liver  Function Tests: No results for input(s): AST, ALT, ALKPHOS, BILITOT, PROT, ALBUMIN in the last 168 hours. No results for input(s): LIPASE, AMYLASE in the last 168 hours. No results for input(s): AMMONIA in the last 168 hours. Coagulation Profile: No results for input(s): INR, PROTIME in the last 168 hours. Cardiac Enzymes: No results for input(s): CKTOTAL, CKMB, CKMBINDEX, TROPONINI in the last 168 hours. BNP (last 3 results) No results for input(s): PROBNP in the last 8760 hours. HbA1C: No results for input(s): HGBA1C  in the last 72 hours. CBG: No results for input(s): GLUCAP in the last 168 hours. Lipid Profile: No results for input(s): CHOL, HDL, LDLCALC, TRIG, CHOLHDL, LDLDIRECT in the last 72 hours. Thyroid Function Tests: No results for input(s): TSH, T4TOTAL, FREET4, T3FREE, THYROIDAB in the last 72 hours. Anemia Panel: No results for input(s): VITAMINB12, FOLATE, FERRITIN, TIBC, IRON, RETICCTPCT in the last 72 hours. Urine analysis:    Component Value Date/Time   COLORURINE STRAW (A) 04/19/2020 1150   APPEARANCEUR CLEAR 04/19/2020 1150   LABSPEC 1.006 04/19/2020 1150   PHURINE 7.0 04/19/2020 1150   GLUCOSEU 150 (A) 04/19/2020 1150   HGBUR NEGATIVE 04/19/2020 1150   BILIRUBINUR NEGATIVE 04/19/2020 1150   BILIRUBINUR negative 09/25/2018 1432   KETONESUR NEGATIVE 04/19/2020 1150   PROTEINUR NEGATIVE 04/19/2020 1150   UROBILINOGEN 0.2 02/19/2019 1029   NITRITE NEGATIVE 04/19/2020 [place] 04/19/2020 1150    Radiological Exams on Admission: DG Chest Portable 1 View  Result Date: 02/01/2021 CLINICAL DATA:  Shortness of breath EXAM: PORTABLE CHEST 1 VIEW COMPARISON:  Chest x-ray 01/26/2021 FINDINGS: There are minimal bibasilar opacities favored is atelectasis. There is no pleural effusion or pneumothorax. Cardiomediastinal silhouette is within normal limits. There are multiple healed left-sided rib fractures, unchanged. IMPRESSION: Minimal bibasilar  atelectasis Electronically Signed   By: Ronney Asters M.D.   On: 02/01/2021 02:01     EKG: Independently reviewed, with result as described above.    Assessment/Plan   Principal Problem:   Acute exacerbation of chronic obstructive pulmonary disease (COPD) (HCC) Active Problems:   SOB (shortness of breath)   Hypertension   Chronic respiratory failure with hypoxia (HCC)   COVID-19 virus infection   Atypical chest pain   GERD (gastroesophageal reflux disease)     #) Acute COPD exacerbation: in the context of a documented history of COPD, diagnosis of acute exacerbation on the basis of 6 to 7 days of progressive shortness of breath, with presenting CXR showing evidence of bibasilar atelectasis, but otherwise no no evidence of acute cardiopulmonary process, including no evidence of overt infiltrate, edema, effusion, or pneumothorax. Etiology of exac appears to be on the basis of COVID-19 infection given associated positive COVID-19 PCR this evening. no clinical or radiographic evidence to suggest acutely decompensated heart failure at this time. Additionally, ACS appears less likely in the absence of typical chest pain, with EKG showing no evidence of acute ischemic changes, and with negative Troponin results, which is particularly reassuring given that the duration of patient's shortness of breath over the course of nearly 1 week would have provided ample time for elevation of troponin if there was associated ACS. Clinically, acute PE also appears to be less likely at this time.  However, in the context of presenting pleuritic chest discomfort and COVID-19 infection, with associated increased risk for development of acute pulmonary embolism due to associated per inflammatory state, will check D-dimer to further evaluate for underlying acute pulmonary embolism.  Of note, influenza PCR found to be negative. Will add-on procalcitonin to further eval for any underlying pna. Outpatient respiratory  regimen appears to include Trelegy as well as prn albuterol inhaler, with associated good reported compliance.  Patient confirms he is a former smoker, as above.  Presentation associated with progression of the above respiratory symptoms in spite of a patient initiation of prednisone and doxycycline, further warranting admission for further evaluation and management of his presenting acute COPD exacerbation.    Plan: monitor continuous pulse oxymetry. Monitor on  telemetry. Solumedrol. Scheduled duonebs q6 hours. Prn albuterol inhaler. BMP in the morning. Repeat CBC in the morning. Check serum Mg and Phos levels. Will attempt additional chart review to evaluate most recent PFT results. Will continue doxycycline for benefit of shortened duration of hospitalization associated with antibiotic initiation in the setting of acute COPD exacerbation. Check blood gas. Add-on procalcitonin.  Further evaluation and management of COVID-19 infection, as further detailed below.  Check D-dimer.      #) COVID-19 infection: Diagnosis on the basis of 1 week of progressive shortness of breath associated with new onset nonproductive cough, with positive COVID-19 PCR result when checked this evening.  Given the duration of the patient's acute respiratory symptoms, it appears that he is outside of the treatment window for initiation of remdesivir, paxlovid, and molnupiravir .  Of note, will initiate solumedrol as component of management for consequential acute COPD exacerbation, as further detailed above.  O2 sats in the high 90s on his baseline 2 to 3 L continuous nasal cannula, with chest x-ray showing no evidence of acute cardiopulmonary process, with the exception of suspected mild bibasilar atelectasis, as further conveyed above.  Plan: Monitor continuous pulse oximetry.  Incentive spirometry, flutter valve.  Add on procalcitonin.  Check/trend CRP.  D-dimer, as above.  Repeat CMP/CBC.  Further evaluation and management of  consequential acute COPD exacerbation, including scheduled duo nebulizers, prn albuterol nebulizer, and solumedrol, as above.  Check blood gas.  Refraining from initiation of antiviral therapy, as described above.  Check serum magnesium and phosphorus levels.       #) Atypical chest pain: 3 to 4 days of intermittent bilateral chest wall discomfort, limited to periods of coughing, suggestive of musculoskeletal component given that this chest discomfort started a few days after onset of initial shortness of breath/cough.  Does not appear cardiac in nature, particularly in the absence of any exertional component.  Along this line, ACS is felt to be less likely, given no elevation in troponin and EKG showing no evidence of acute cardiopulmonary process.  Chest x-ray, as above.  Given the pleuritic element of his chest pain in the context of COVID-19 infection, will check D-dimer to further evaluate for any underlying acute pulmonary embolism, as further detailed above.  Plan: Check D-dimer, as above.  As needed acetaminophen.  Further evaluation management of acute COPD exacerbation/COVID-19 fraction, as above.  Monitor on telemetry.  As needed [employer]  Repeat CBC in the morning.      #) Chronic hypoxic respiratory failure: In the setting of severe COPD, the patient is on 2 to 3 L continuous nasal cannula at home, with presenting oxygen saturations in the high 90s on his baseline rate of supplemental O2 delivery.   Plan: Monitor continuous pulse oximetry.  Further evaluation management of presenting acute COPD exacerbation/COVID-19 infection, as above.  Check serum magnesium and phosphorus levels.         #) Essential Hypertension: documented h/o such, with outpatient antihypertensive regimen including losartan, Toprol-XL.  SBP's in the ED today: In the 120s to 150s mmHg.   Plan: Close monitoring of subsequent BP via routine VS. resume home antihypertensive  medications.       #) GERD: documented h/o such; on Protonix as outpatient.   Plan: continue home PPI.        DVT prophylaxis: SCD's   Code Status: Full code Family Communication: none Disposition Plan: Per Rounding Team Consults called: none;  Admission status: Inpatient; med telemetry  Warrants inpatient status  on basis of further evaluation management of acute COPD exacerbation, including indication for IV corticosteroids given failure of improvement of the patient's respiratory symptoms following good compliance with outpatient course of oral prednisone as well as compliance with outpatient respiratory regimen, prompting indication for scheduled/as needed nebulizer intervention, as further defined above.    PLEASE NOTE THAT DRAGON DICTATION SOFTWARE WAS USED IN THE CONSTRUCTION OF THIS NOTE.   [place] DO [facility]  From [place]   02/01/2021, 4:07 AM

## 2021-02-01 NOTE — Care Management (Signed)
°  Transition of Care [facility]) Screening Note   Patient Details  Name: James [name] Date of Birth: 06/14/1960   Transition of Care [facility]) CM/SW Contact:    Carles Collet, RN Phone Number: 02/01/2021, 1:46 PM    Transition of Care Department [facility]) has reviewed patient and no TOC needs have been identified at this time. We will continue to monitor patient advancement through interdisciplinary progression rounds. If new patient transition needs arise, please place a TOC consult.

## 2021-02-01 NOTE — Progress Notes (Signed)
Patient ID: James [name], male   DOB: 1960-07-08, 61 y.o.   MRN: [id number] Patient admitted early this morning for shortness of breath secondary to COPD exacerbation along with COVID-19 infection and has been started on IV steroids.  Patient seen and examined at bedside and plan of care discussed with him.  I have reviewed patient's medical records including this morning's H&P, current vitals, labs, medications myself.  Continue steroids.  Check daily inflammatory markers.  Currently on 2 L oxygen via nasal cannula.

## 2021-02-01 NOTE — ED Provider Notes (Signed)
[facility] EMERGENCY DEPARTMENT Provider Note   CSN: [id number] Arrival date & time: 02/01/21  0125     History  Chief Complaint  Patient presents with   Shortness of Breath    James [name] is a 61 y.o. male.  Patient with history of COPD on home oxygen, hypertension, diabetes here with increasing shortness of breath and coughing over the past 2 days.  Woke up today with difficulty breathing and coughing up clear mucus.  He was seen in urgent care on January 23 and treated for possible early pneumonia with doxycycline and prednisone 40 mg.  He however did not start his medications until 2 days ago.  He is still using his nebulizer at home 4-5 times a day.  Reports constant chest tightness that radiates to both of his shoulders that is worse with coughing and movement.  Denies any cardiac history.  Denies any leg pain or leg swelling.  Does have diffuse abdominal soreness but no vomiting or diarrhea. No fever. He does not have to increase his home oxygen  The history is provided by the patient and the EMS personnel.  Shortness of Breath Associated symptoms: cough and wheezing   Associated symptoms: no abdominal pain, no chest pain, no fever, no headaches and no rash       Home Medications Prior to Admission medications   Medication Sig Start Date End Date Taking? Authorizing Provider  albuterol (PROVENTIL) (2.5 MG/3ML) 0.083% nebulizer solution TAKE 3 MLS (2.5 MG TOTAL) BY NEBULIZATION IN THE MORNING, AT NOON, AND AT BEDTIME. 12/22/20 12/22/21  [name], MD  albuterol (VENTOLIN HFA) 108 (90 Base) MCG/ACT inhaler Inhale 2 puffs into the lungs every 4 (four) hours as needed. 12/22/20   [name], MD  doxycycline (VIBRA-TABS) 100 MG tablet Take 1 tablet (100 mg total) by mouth 2 (two) times daily for 7 days. 01/26/21 02/03/21  Barrett Henle, MD  Fluticasone-Umeclidin-Vilant (TRELEGY ELLIPTA) 100-62.5-25 MCG/ACT AEPB Inhale 1 puff into the lungs  daily. 12/22/20   [name], MD  furosemide (LASIX) 20 MG tablet Take 1 tablet (20 mg total) by mouth daily as needed for edema. 12/22/20 12/22/21  [name], MD  guaiFENesin (MUCINEX) 600 MG 12 hr tablet Take 1 tablet (600 mg total) by mouth 2 (two) times daily as needed for cough or to loosen phlegm. 06/29/20 06/29/21  [name], MD  losartan (COZAAR) 50 MG tablet Take 1 tablet (50 mg total) by mouth daily. 12/22/20   [name], MD  metoprolol succinate (TOPROL-XL) 25 MG 24 hr tablet Take 1 tablet (25 mg total) by mouth daily. 12/22/20 12/22/21  [name], MD  pantoprazole (PROTONIX) 40 MG tablet Take 1 tablet (40 mg total) by mouth daily. 12/22/20   [name], MD  predniSONE (DELTASONE) 20 MG tablet Take 2 tablets (40 mg total) by mouth daily with breakfast for 5 days. 01/26/21 02/01/21  Barrett Henle, MD  pregabalin (LYRICA) 75 MG capsule Take 1 capsule (75 mg total) by mouth 2 (two) times daily. 12/22/20   [name], MD  Respiratory Therapy Supplies (FLUTTER) DEVI Use 4 times daily 04/27/18   [name], MD      Allergies    Gabapentin and Metformin and related    Review of Systems   Review of Systems  Constitutional:  Positive for fatigue. Negative for activity change, appetite change and fever.  HENT:  Positive for congestion and rhinorrhea.  Eyes:  Negative for visual disturbance.  Respiratory:  Positive for cough, chest tightness, shortness of breath and wheezing.   Cardiovascular:  Negative for chest pain.  Gastrointestinal:  Negative for abdominal pain and nausea.  Genitourinary:  Negative for dysuria, flank pain and hematuria.  Musculoskeletal:  Negative for arthralgias and myalgias.  Skin:  Negative for rash.  Neurological:  Negative for dizziness, weakness and headaches.   all other systems are negative except as noted in the HPI and PMH.   Physical Exam Updated Vital Signs BP 122/76 (BP Location: Right Arm)    Pulse  (!) 111    Temp 98.4 F (36.9 C) (Oral)    Resp 20    Ht 5\' 9"  (1.753 m)    Wt 98.9 kg    SpO2 99% Comment: 2L O2   BMI 32.19 kg/m  Physical Exam Vitals and nursing note reviewed.  Constitutional:      General: He is in acute distress.     Appearance: He is well-developed.     Comments: Dyspneic with conversation  HENT:     Head: Normocephalic and atraumatic.     Mouth/Throat:     Pharynx: No oropharyngeal exudate.  Eyes:     Conjunctiva/sclera: Conjunctivae normal.     Pupils: Pupils are equal, round, and reactive to light.  Neck:     Comments: No meningismus. Cardiovascular:     Rate and Rhythm: Normal rate and regular rhythm.     Heart sounds: Normal heart sounds. No murmur heard. Pulmonary:     Effort: Respiratory distress present.     Breath sounds: Wheezing present.     Comments: Diminished bilaterally with expiratory wheeze Abdominal:     Palpations: Abdomen is soft.     Tenderness: There is no abdominal tenderness. There is no guarding or rebound.  Musculoskeletal:        General: No tenderness. Normal range of motion.     Cervical back: Normal range of motion and neck supple.  Skin:    General: Skin is warm.  Neurological:     Mental Status: He is alert and oriented to person, place, and time.     Cranial Nerves: No cranial nerve deficit.     Motor: No abnormal muscle tone.     Coordination: Coordination normal.     Comments:  5/5 strength throughout. CN 2-12 intact.Equal grip strength.   Psychiatric:        Behavior: Behavior normal.    ED Results / Procedures / Treatments   Labs (all labs ordered are listed, but only abnormal results are displayed) Labs Reviewed  RESP PANEL BY RT-PCR (FLU A&B, COVID) ARPGX2 - Abnormal; Notable for the following components:      Result Value   SARS Coronavirus 2 by RT PCR POSITIVE (*)    All other components within normal limits  CBC WITH DIFFERENTIAL/PLATELET - Abnormal; Notable for the following components:   Monocytes  Absolute 1.4 (*)    All other components within normal limits  BASIC METABOLIC PANEL - Abnormal; Notable for the following components:   Glucose, Bld 104 (*)    All other components within normal limits  COMPREHENSIVE METABOLIC PANEL - Abnormal; Notable for the following components:   Glucose, Bld 196 (*)    Total Protein 6.1 (*)    Alkaline Phosphatase 131 (*)    All other components within normal limits  CBC WITH DIFFERENTIAL/PLATELET - Abnormal; Notable for the following components:   WBC 11.7 (*)  Neutro Abs 10.9 (*)    Lymphs Abs 0.4 (*)    All other components within normal limits  BRAIN NATRIURETIC PEPTIDE  PHOSPHORUS  MAGNESIUM  PROCALCITONIN  C-REACTIVE PROTEIN  D-DIMER, QUANTITATIVE  BLOOD GAS, VENOUS  TROPONIN I (HIGH SENSITIVITY)  TROPONIN I (HIGH SENSITIVITY)    EKG EKG Interpretation  Date/Time:  Sunday February 01 2021 01:35:08 EST Ventricular Rate:  106 PR Interval:  177 QRS Duration: 98 QT Interval:  313 QTC Calculation: 416 R Axis:   43 Text Interpretation: Sinus tachycardia Anterior infarct, old No significant change was found Confirmed by Ezequiel Essex [phone]) on 02/01/2021 1:44:01 AM  Radiology DG Chest Portable 1 View  Result Date: 02/01/2021 CLINICAL DATA:  Shortness of breath EXAM: PORTABLE CHEST 1 VIEW COMPARISON:  Chest x-ray 01/26/2021 FINDINGS: There are minimal bibasilar opacities favored is atelectasis. There is no pleural effusion or pneumothorax. Cardiomediastinal silhouette is within normal limits. There are multiple healed left-sided rib fractures, unchanged. IMPRESSION: Minimal bibasilar atelectasis Electronically Signed   By: Ronney Asters M.D.   On: 02/01/2021 02:01    Procedures Procedures    Medications Ordered in ED Medications  ipratropium-albuterol (DUONEB) 0.5-2.5 (3) MG/3ML nebulizer solution 3 mL (has no administration in time range)  methylPREDNISolone sodium succinate (SOLU-MEDROL) 125 mg/2 mL injection 125 mg (has no  administration in time range)  magnesium sulfate IVPB 2 g 50 mL (has no administration in time range)    ED Course/ Medical Decision Making/ A&P                           Medical Decision Making Amount and/or Complexity of Data Reviewed Labs: ordered. Radiology: ordered.  Risk Prescription drug management. Decision regarding hospitalization.  COPD with increased work of breathing and coughing over the past 2 days.  Diminished breath sounds on arrival with wheezing.  Steroids and bronchodilators will be given EKG without acute ischemia.  COVID 19 is positive. Symptoms ongoing for 6-7 days. Appears to be out of antiviral treatment window.   Chest pain is atypical for ACS, worse with coughing. EKG nonischemic and troponin negative.   Moderately increased work of breathing but not hypoxic on home O2. Continue bronchodilators and steroids for COPD exacerbation.  Admission dw Dr. Velia Meyer.         Final Clinical Impression(s) / ED Diagnoses Final diagnoses:  None    Rx / DC Orders ED Discharge Orders     None         Frona Yost, Annie Main, MD 02/01/21 [phone]

## 2021-02-01 NOTE — ED Triage Notes (Signed)
Patient arrives with [facility] from home c/o [facility] that woke him up from sleep tonight; pt has hx of COPD, HTN, and DM2 and wears 2L O2 at home. Pt recently dx'd with pneumonia and is currently taking antibx. Pt reports "spitting up clear fluids." A&o x4  EMS vitals:  CBG 130 BP 142/90 95% on 2L O2

## 2021-02-01 NOTE — Progress Notes (Signed)
PHARMACIST - PHYSICIAN COMMUNICATION Antibiotic IV to Oral Route Change Policy  RECOMMENDATION: This patient is receiving doxycycline by the intravenous route.  Based on criteria approved by the Pharmacy and Therapeutics Committee, the antibiotic(s) is/are being converted to the equivalent oral dose form(s).   DESCRIPTION: These criteria include: Patient being treated for a respiratory tract infection, urinary tract infection, cellulitis or clostridium difficile associated diarrhea if on metronidazole The patient is not neutropenic and does not exhibit a GI malabsorption state The patient is eating (either orally or via tube) and/or has been taking other orally administered medications for a least 24 hours The patient is improving clinically and has a Tmax < 100.5  If you have questions about this conversion, please contact the Pharmacy Department  [x]   [phone] )  [place], PharmD Pharmacy Resident 02/01/2021, 1:19 PM

## 2021-02-01 NOTE — ED Notes (Signed)
Provider at bedside

## 2021-02-02 DIAGNOSIS — U071 COVID-19: Secondary | ICD-10-CM | POA: Diagnosis not present

## 2021-02-02 DIAGNOSIS — R0789 Other chest pain: Secondary | ICD-10-CM | POA: Diagnosis not present

## 2021-02-02 DIAGNOSIS — J441 Chronic obstructive pulmonary disease with (acute) exacerbation: Secondary | ICD-10-CM | POA: Diagnosis not present

## 2021-02-02 DIAGNOSIS — J9611 Chronic respiratory failure with hypoxia: Secondary | ICD-10-CM | POA: Diagnosis not present

## 2021-02-02 LAB — COMPREHENSIVE METABOLIC PANEL WITH GFR
ALT: 15 U/L (ref 0–44)
AST: 18 U/L (ref 15–41)
Albumin: 3.2 g/dL — ABNORMAL LOW (ref 3.5–5.0)
Alkaline Phosphatase: 114 U/L (ref 38–126)
Anion gap: 11 (ref 5–15)
BUN: 20 mg/dL (ref 6–20)
CO2: 25 mmol/L (ref 22–32)
Calcium: 9.2 mg/dL (ref 8.9–10.3)
Chloride: 102 mmol/L (ref 98–111)
Creatinine, Ser: 1.06 mg/dL (ref 0.61–1.24)
GFR, Estimated: >60 mL/min
Glucose, Bld: 276 mg/dL — ABNORMAL HIGH (ref 70–99)
Potassium: 4.3 mmol/L (ref 3.5–5.1)
Sodium: 138 mmol/L (ref 135–145)
Total Bilirubin: 0.5 mg/dL (ref 0.3–1.2)
Total Protein: 5.7 g/dL — ABNORMAL LOW (ref 6.5–8.1)

## 2021-02-02 LAB — C-REACTIVE PROTEIN: CRP: 1 mg/dL — ABNORMAL HIGH (ref <1.0)

## 2021-02-02 LAB — D-DIMER, QUANTITATIVE: D-Dimer, Quant: <0.27 ug/mL-FEU (ref 0.00–0.50)

## 2021-02-02 MED ORDER — METHYLPREDNISOLONE SODIUM SUCC 125 MG IJ SOLR
80.0000 mg | Freq: Two times a day (BID) | INTRAMUSCULAR | Status: DC
  Administered 2021-02-02 – 2021-02-03 (×2): 80 mg via INTRAVENOUS
  Filled 2021-02-02 (×2): qty 2

## 2021-02-02 NOTE — TOC Initial Note (Signed)
Transition of Care [facility]) - Initial/Assessment Note    Patient Details  Name: James [name] MRN: [id number] Date of Birth: [birth year]  Transition of Care [facility]) CM/SW Contact:    Cyndi Bender, RN Phone Number: 02/02/2021, 1:52 PM  Clinical Narrative:               Spoke to patient regarding transition needs.  He states he has home 02 with [employer] and usually uses 2L. He is currently on 2L in the hospital. Patient's sister usually helps with transportation to apts. He states if his sister can't take him home he will need transportation. TOC will continue to follow.      Expected Discharge Plan: Home/Self Care Barriers to Discharge: Continued Medical Work up   Patient Goals and CMS Choice Patient states their goals for this hospitalization and ongoing recovery are:: return home      Expected Discharge Plan and Services Expected Discharge Plan: Home/Self Care   Discharge Planning Services: CM Consult   Living arrangements for the past 2 months: Single Family Home                                      Prior Living Arrangements/Services Living arrangements for the past 2 months: Single Family Home Lives with:: Self Patient language and need for interpreter reviewed:: Yes Do you feel safe going back to the place where you live?: Yes        Care giver support system in place?: Yes (comment)   Criminal Activity/Legal Involvement Pertinent to Current Situation/Hospitalization: No - Comment as needed  Activities of Daily Living Home Assistive Devices/Equipment: None ADL Screening (condition at time of admission) Patient's cognitive ability adequate to safely complete daily activities?: Yes Is the patient deaf or have difficulty hearing?: No Does the patient have difficulty seeing, even when wearing glasses/contacts?: No Does the patient have difficulty concentrating, remembering, or making decisions?: No Patient able to express need for assistance with ADLs?:  Yes Does the patient have difficulty dressing or bathing?: No Independently performs ADLs?: Yes (appropriate for developmental age) Does the patient have difficulty walking or climbing stairs?: No Weakness of Legs: None Weakness of Arms/Hands: None  Permission Sought/Granted                  Emotional Assessment   Attitude/Demeanor/Rapport: Engaged Affect (typically observed): Accepting Orientation: : Oriented to Self, Oriented to Place, Oriented to  Time, Oriented to Situation Alcohol / Substance Use: Not Applicable Psych Involvement: No (comment)  Admission diagnosis:  Acute exacerbation of chronic obstructive pulmonary disease (COPD) ([place]) [J44.1] COPD exacerbation ([place]) [J44.1] COVID-19 [U07.1] Patient Active Problem List   Diagnosis Date Noted   Acute exacerbation of chronic obstructive pulmonary disease (COPD) ([place]) 02/01/2021   Atypical chest pain 02/01/2021   GERD (gastroesophageal reflux disease) 02/01/2021   Neuropathy 09/29/2020   Obesity (BMI 30.0-34.9) 04/19/2020   COVID-19 virus infection 10/19/2019   Bronchiectasis ([place]) 09/24/2019   Type 2 diabetes mellitus without complication, without long-term current use of insulin ([place]) 07/24/2019   Other atopic dermatitis 05/01/2019   Chronic respiratory failure with hypoxia ([place]) 04/23/2019   Intellectual disability 04/23/2019   History of alcohol use 11/23/2018   Hypertension 05/17/2016   History of tobacco abuse 04/06/2016   COPD mixed type ([place]) 03/26/2016   SOB (shortness of breath)    PCP:  [name], MD Pharmacy:   [facility]  Engineer, production at [place] [address] [place] [address] Phone: [phone] Fax: [place] [address] [place] [address] Phone: [phone] Fax: [phone]  [facility] - [place], [place] - [address] [place]  [address] [place] [address] Phone: [phone] Fax: [phone]  [id number] - [place], [place] [address] [place] [address] Phone: [phone] Fax: [phone]     Social Determinants of Health (SDOH) Interventions    Readmission Risk Interventions Readmission Risk Prevention Plan 08/14/2020 04/24/2020 03/28/2020  Transportation Screening Complete Complete Complete  PCP or Specialist Appt within 3-5 Days Complete - Not Complete  Not Complete comments patient already has an appointment schedules with [name] - Pt had existing appt with [name] on 04/14/20.  [place] or Home Care Consult Complete - Complete  Social Work Consult for Recovery Care Planning/Counseling Complete - Complete  Palliative Care Screening Not Applicable - Not Applicable  Medication Review (RN Care Manager) Complete - -  PCP or Specialist appointment within 3-5 days of discharge - Not Complete -  PCP/Specialist Appt Not Complete comments - Pt had existing appt 5/23--this is first available per [place] or [place] - Complete -  SW Recovery Care/Counseling Consult - Complete -  [place] - Complete -  [place] - Not Applicable -  Some recent data might be hidden

## 2021-02-02 NOTE — Progress Notes (Signed)
Patient ID: James [name], male   DOB: [birth year], 61 y.o.   MRN: [id number]  PROGRESS NOTE    HERSHY FLENNER  JOI:[id number] DOB: 1960-09-20 DOA: 02/01/2021 PCP: [name], MD   Brief Narrative:  61 year old male with history of chronic respiratory failure with hypoxia on 2 L oxygen via nasal cannula, COPD, hypertension, GERD presented with worsening shortness of breath.  On presentation, COVID-19 testing was positive.  Chest x-ray showed minimal bibasilar airspace opacities favoring atelectasis without overt infiltrate.  He was started on IV Solu-Medrol.  Assessment & Plan:   COPD exacerbation Chronic respiratory failure with hypoxia -Presented with worsening shortness of breath possibly exacerbated by COVID-19 infection -Currently still on 2 L oxygen by nasal cannula.  Normally wears the same at home -Chest x-ray showed minimal bibasilar airspace opacities favoring atelectasis without overt infiltrate -Respiratory status improving. -Decrease Solu-Medrol to 80 mg IV every 12 hours for today.  Continue doxycycline, nebs and Dulera.  COVID-19 infection -Monitor daily inflammatory markers -COVID-19 Labs  Recent Labs    02/01/21 0545 02/02/21 0125  DDIMER 0.27 <0.27  CRP 0.7 1.0*    Lab Results  Component Value Date   SARSCOV2NAA POSITIVE (A) 02/01/2021   SARSCOV2NAA NEGATIVE 08/11/2020   [place] NEGATIVE 06/26/2020   [place] NEGATIVE 04/19/2020   -Continue steroids.  Patient was not started on other treatments for COVID on admission because he apparently was out of treatment window.  Atypical chest pain -Possibly from above.  No troponin elevation and EKG did not show signs of ischemia.  No further work-up needed.  Hypertension -Continue metoprolol succinate and losartan.  Blood pressure intermittently on the higher side  GERD -Continue Protonix  DVT prophylaxis: SCDs Code Status: Full Family Communication: None at bedside Disposition Plan: Status  is: Inpatient  Remains inpatient appropriate because: Of need for IV steroids  Consultants: None  Procedures: None  Antimicrobials: Doxycycline from 02/01/2021 onwards   Subjective: Patient seen and examined at bedside.  Feels slightly better but still feels very short of breath with exertion.  No overnight fever, nausea, vomiting reported.  Objective: Vitals:   02/01/21 2012 02/01/21 2152 02/02/21 0000 02/02/21 0416  BP: 135/77  (!) 141/76 (!) 119/56  Pulse: 88  94 80  Resp: 17  20 18   Temp: 98.2 F (36.8 C)  98.2 F (36.8 C) 98.1 F (36.7 C)  TempSrc: Axillary  Oral Oral  SpO2: 94% 92% 90% 92%  Weight:      Height:        Intake/Output Summary (Last 24 hours) at 02/02/2021 0740 Last data filed at 02/02/2021 0500 Gross per 24 hour  Intake 440 ml  Output 1400 ml  Net -960 ml   Filed Weights   02/01/21 0135 02/01/21 1252  Weight: 98.9 kg 89.4 kg    Examination:  General exam: Appears calm and comfortable.  Currently on 2 L oxygen by nasal cannula. Respiratory system: Bilateral decreased breath sounds at bases with scattered crackles Cardiovascular system: S1 & S2 heard, Rate controlled Gastrointestinal system: Abdomen is nondistended, soft and nontender. Normal bowel sounds heard. Extremities: No cyanosis, clubbing, edema  Central nervous system: Alert and oriented. No focal neurological deficits. Moving extremities Skin: No rashes, lesions or ulcers Psychiatry: Judgement and insight appear normal. Mood & affect appropriate.     Data Reviewed: I have personally reviewed following labs and imaging studies  CBC: Recent Labs  Lab 02/01/21 0142 02/01/21 0545  WBC 9.8 11.7*  NEUTROABS 7.3 10.9*  HGB  14.6 13.5  HCT 44.8 42.7  MCV 93.9 94.9  PLT 265 818   Basic Metabolic Panel: Recent Labs  Lab 02/01/21 0142 02/01/21 0545 02/02/21 0125  NA 143 139 138  K 3.8 4.1 4.3  CL 102 102 102  CO2 30 25 25   GLUCOSE 104* 196* 276*  BUN 10 12 20   CREATININE  0.80 0.90 1.06  CALCIUM 9.2 8.9 9.2  MG  --  2.1  --   PHOS  --  3.1  --    GFR: Estimated Creatinine Clearance: 83 mL/min (by C-G formula based on SCr of 1.06 mg/dL). Liver Function Tests: Recent Labs  Lab 02/01/21 0545 02/02/21 0125  AST 20 18  ALT 14 15  ALKPHOS 131* 114  BILITOT 0.8 0.5  PROT 6.1* 5.7*  ALBUMIN 3.6 3.2*   No results for input(s): LIPASE, AMYLASE in the last 168 hours. No results for input(s): AMMONIA in the last 168 hours. Coagulation Profile: No results for input(s): INR, PROTIME in the last 168 hours. Cardiac Enzymes: No results for input(s): CKTOTAL, CKMB, CKMBINDEX, TROPONINI in the last 168 hours. BNP (last 3 results) No results for input(s): PROBNP in the last 8760 hours. HbA1C: No results for input(s): HGBA1C in the last 72 hours. CBG: No results for input(s): GLUCAP in the last 168 hours. Lipid Profile: No results for input(s): CHOL, HDL, LDLCALC, TRIG, CHOLHDL, LDLDIRECT in the last 72 hours. Thyroid Function Tests: No results for input(s): TSH, T4TOTAL, FREET4, T3FREE, THYROIDAB in the last 72 hours. Anemia Panel: No results for input(s): VITAMINB12, FOLATE, FERRITIN, TIBC, IRON, RETICCTPCT in the last 72 hours. Sepsis Labs: Recent Labs  Lab 02/01/21 0545  PROCALCITON <0.10    Recent Results (from the past 240 hour(s))  Resp Panel by RT-PCR (Flu A&B, Covid) Nasopharyngeal Swab     Status: Abnormal   Collection Time: 02/01/21  1:41 AM   Specimen: Nasopharyngeal Swab; Nasopharyngeal(NP) swabs in vial transport medium  Result Value Ref Range Status   SARS Coronavirus 2 by RT PCR POSITIVE (A) NEGATIVE Final    Comment: (NOTE) SARS-CoV-2 target nucleic acids are DETECTED.  The SARS-CoV-2 RNA is generally detectable in upper respiratory specimens during the acute phase of infection. Positive results are indicative of the presence of the identified virus, but do not rule out bacterial infection or co-infection with other pathogens  not detected by the test. Clinical correlation with patient history and other diagnostic information is necessary to determine patient infection status. The expected result is Negative.  Fact Sheet for Patients: EntrepreneurPulse.com.au  Fact Sheet for Healthcare Providers: IncredibleEmployment.be  This test is not yet approved or cleared by the [place] FDA and  has been authorized for detection and/or diagnosis of SARS-CoV-2 by FDA under an Emergency Use Authorization (EUA).  This EUA will remain in effect (meaning this test can be used) for the duration of  the COVID-19 declaration under Section 564(b)(1) of the A ct, 21 U.S.C. section 360bbb-3(b)(1), unless the authorization is terminated or revoked sooner.     Influenza A by PCR NEGATIVE NEGATIVE Final   Influenza B by PCR NEGATIVE NEGATIVE Final    Comment: (NOTE) The Xpert Xpress SARS-CoV-2/FLU/RSV plus assay is intended as an aid in the diagnosis of influenza from Nasopharyngeal swab specimens and should not be used as a sole basis for treatment. Nasal washings and aspirates are unacceptable for Xpert Xpress SARS-CoV-2/FLU/RSV testing.  Fact Sheet for Patients: EntrepreneurPulse.com.au  Fact Sheet for Healthcare Providers: IncredibleEmployment.be  This test is  not yet approved or cleared by the [place] and has been authorized for detection and/or diagnosis of SARS-CoV-2 by FDA under an Emergency Use Authorization (EUA). This EUA will remain in effect (meaning this test can be used) for the duration of the COVID-19 declaration under Section 564(b)(1) of the Act, 21 U.S.C. section 360bbb-3(b)(1), unless the authorization is terminated or revoked.  Performed at [facility], [place] [address], [place], [place] [address]          Radiology Studies: DG Chest Portable 1 View  Result Date: 02/01/2021 CLINICAL DATA:   Shortness of breath EXAM: PORTABLE CHEST 1 VIEW COMPARISON:  Chest x-ray 01/26/2021 FINDINGS: There are minimal bibasilar opacities favored is atelectasis. There is no pleural effusion or pneumothorax. Cardiomediastinal silhouette is within normal limits. There are multiple healed left-sided rib fractures, unchanged. IMPRESSION: Minimal bibasilar atelectasis Electronically Signed   By: Ronney Asters M.D.   On: 02/01/2021 02:01        Scheduled Meds:  doxycycline  100 mg Oral Q12H   ipratropium-albuterol  3 mL Nebulization Q6H   losartan  50 mg Oral Daily   methylPREDNISolone (SOLU-MEDROL) injection  80 mg Intravenous Q6H   metoprolol succinate  25 mg Oral Daily   mometasone-formoterol  2 puff Inhalation BID   pantoprazole  40 mg Oral Daily   Continuous Infusions:        [name], MD [facility] 02/02/2021, 7:40 AM

## 2021-02-03 DIAGNOSIS — R0789 Other chest pain: Secondary | ICD-10-CM | POA: Diagnosis not present

## 2021-02-03 DIAGNOSIS — J441 Chronic obstructive pulmonary disease with (acute) exacerbation: Secondary | ICD-10-CM | POA: Diagnosis not present

## 2021-02-03 DIAGNOSIS — U071 COVID-19: Secondary | ICD-10-CM | POA: Diagnosis not present

## 2021-02-03 DIAGNOSIS — J9611 Chronic respiratory failure with hypoxia: Secondary | ICD-10-CM | POA: Diagnosis not present

## 2021-02-03 LAB — COMPREHENSIVE METABOLIC PANEL
ALT: 14 U/L (ref 0–44)
AST: 12 U/L — ABNORMAL LOW (ref 15–41)
Albumin: 3.2 g/dL — ABNORMAL LOW (ref 3.5–5.0)
Alkaline Phosphatase: 115 U/L (ref 38–126)
Anion gap: 10 (ref 5–15)
BUN: 25 mg/dL — ABNORMAL HIGH (ref 6–20)
CO2: 26 mmol/L (ref 22–32)
Calcium: 9.3 mg/dL (ref 8.9–10.3)
Chloride: 101 mmol/L (ref 98–111)
Creatinine, Ser: 0.88 mg/dL (ref 0.61–1.24)
GFR, Estimated: >60 mL/min (ref >60)
Glucose, Bld: 225 mg/dL — ABNORMAL HIGH (ref 70–99)
Potassium: 4.3 mmol/L (ref 3.5–5.1)
Sodium: 137 mmol/L (ref 135–145)
Total Bilirubin: 0.4 mg/dL (ref 0.3–1.2)
Total Protein: 5.6 g/dL — ABNORMAL LOW (ref 6.5–8.1)

## 2021-02-03 LAB — D-DIMER, QUANTITATIVE: D-Dimer, Quant: <0.27 ug/mL-FEU (ref 0.00–0.50)

## 2021-02-03 LAB — C-REACTIVE PROTEIN: CRP: 0.7 mg/dL (ref <1.0)

## 2021-02-03 MED ORDER — METHYLPREDNISOLONE SODIUM SUCC 125 MG IJ SOLR
120.0000 mg | Freq: Every day | INTRAMUSCULAR | Status: DC
  Administered 2021-02-03 – 2021-02-04 (×2): 120 mg via INTRAVENOUS
  Filled 2021-02-03 (×2): qty 2

## 2021-02-03 MED ORDER — FUROSEMIDE 20 MG PO TABS
20.0000 mg | ORAL_TABLET | Freq: Every day | ORAL | Status: DC
  Administered 2021-02-03 – 2021-02-04 (×2): 20 mg via ORAL
  Filled 2021-02-03 (×2): qty 1

## 2021-02-03 MED ORDER — NICOTINE 14 MG/24HR TD PT24
14.0000 mg | MEDICATED_PATCH | Freq: Every day | TRANSDERMAL | Status: DC
  Administered 2021-02-03 – 2021-02-04 (×2): 14 mg via TRANSDERMAL
  Filled 2021-02-03 (×2): qty 1

## 2021-02-03 NOTE — Progress Notes (Signed)
Patient ID: James [name], male   DOB: 05/25/1960, 61 y.o.   MRN: [id number]  PROGRESS NOTE    NAMARI BRETON  LOV:[id number] DOB: [birth year] DOA: 02/01/2021 PCP: [name], MD   Brief Narrative:  61 year old male with history of chronic respiratory failure with hypoxia on 2 L oxygen via nasal cannula, COPD, hypertension, GERD presented with worsening shortness of breath.  On presentation, COVID-19 testing was positive.  Chest x-ray showed minimal bibasilar airspace opacities favoring atelectasis without overt infiltrate.  He was started on IV Solu-Medrol.  Assessment & Plan:   COPD exacerbation Chronic respiratory failure with hypoxia -Presented with worsening shortness of breath possibly exacerbated by COVID-19 infection -Currently still on 2 L oxygen by nasal cannula.  Normally wears the same at home -Chest x-ray showed minimal bibasilar airspace opacities favoring atelectasis without overt infiltrate -Respiratory status improving. -Decrease Solu-Medrol to 60 mg IV every 12 hours for today.  Continue doxycycline, nebs and Dulera.  COVID-19 infection -Monitor daily inflammatory markers -COVID-19 Labs  Recent Labs    02/01/21 0545 02/02/21 0125 02/03/21 0357  DDIMER 0.27 <0.27 <0.27  CRP 0.7 1.0* 0.7     Lab Results  Component Value Date   SARSCOV2NAA POSITIVE (A) 02/01/2021   SARSCOV2NAA NEGATIVE 08/11/2020   [place] NEGATIVE 06/26/2020   [place] NEGATIVE 04/19/2020   -Continue steroids.  Patient was not started on other treatments for COVID on admission because he apparently was out of treatment window.  Atypical chest pain -Possibly from above.  No troponin elevation and EKG did not show signs of ischemia.  No further work-up needed.  Hypertension -Continue metoprolol succinate and losartan.  Blood pressure intermittently on the higher side  GERD -Continue Protonix  DVT prophylaxis: SCDs Code Status: Full Family Communication: None at  bedside Disposition Plan: Status is: Inpatient  Remains inpatient appropriate because: Of need for IV steroids.  Possible discharge in 1 to 2 days if continues to improve.  Consultants: None  Procedures: None  Antimicrobials: Doxycycline from 02/01/2021 onwards   Subjective: Patient seen and examined at bedside.  Breathing is improving but still short of breath with exertion.  No chest pain, vomiting, fever reported. Objective: Vitals:   02/02/21 2019 02/02/21 2118 02/03/21 0020 02/03/21 0340  BP:  (!) 150/74 (!) 167/77 134/74  Pulse:  99 98 78  Resp:  18 17 (!) 23  Temp:  98.8 F (37.1 C) 98.9 F (37.2 C) 98.6 F (37 C)  TempSrc:  Oral Oral Oral  SpO2: 95% 94% 94% 95%  Weight:      Height:        Intake/Output Summary (Last 24 hours) at 02/03/2021 0744 Last data filed at 02/03/2021 0203 Gross per 24 hour  Intake 240 ml  Output 400 ml  Net -160 ml    Filed Weights   02/01/21 0135 02/01/21 1252  Weight: 98.9 kg 89.4 kg    Examination:  General exam: On 2 L oxygen via nasal cannula.  No distress.  Respiratory system: Decreased breath sounds at bases bilaterally with some crackles  cardiovascular system: Rate controlled currently; S1-S2 heard gastrointestinal system: Abdomen is distended slightly; soft and nontender.  Bowel sounds are heard  extremities: Trace lower extremity edema; no clubbing  Central nervous system: Awake and alert alert.  No focal neurological deficits.  Moves extremities  skin: No obvious ecchymosis/lesions  psychiatry: Affect is mostly flat.  No signs of agitation.   Data Reviewed: I have personally reviewed following labs and imaging studies  CBC: Recent Labs  Lab 02/01/21 0142 02/01/21 0545  WBC 9.8 11.7*  NEUTROABS 7.3 10.9*  HGB 14.6 13.5  HCT 44.8 42.7  MCV 93.9 94.9  PLT 265 532    Basic Metabolic Panel: Recent Labs  Lab 02/01/21 0142 02/01/21 0545 02/02/21 0125 02/03/21 0357  NA 143 139 138 137  K 3.8 4.1 4.3 4.3   CL 102 102 102 101  CO2 30 25 25 26   GLUCOSE 104* 196* 276* 225*  BUN 10 12 20  25*  CREATININE 0.80 0.90 1.06 0.88  CALCIUM 9.2 8.9 9.2 9.3  MG  --  2.1  --   --   PHOS  --  3.1  --   --     GFR: Estimated Creatinine Clearance: 100 mL/min (by C-G formula based on SCr of 0.88 mg/dL). Liver Function Tests: Recent Labs  Lab 02/01/21 0545 02/02/21 0125 02/03/21 0357  AST 20 18 12*  ALT 14 15 14   ALKPHOS 131* 114 115  BILITOT 0.8 0.5 0.4  PROT 6.1* 5.7* 5.6*  ALBUMIN 3.6 3.2* 3.2*    No results for input(s): LIPASE, AMYLASE in the last 168 hours. No results for input(s): AMMONIA in the last 168 hours. Coagulation Profile: No results for input(s): INR, PROTIME in the last 168 hours. Cardiac Enzymes: No results for input(s): CKTOTAL, CKMB, CKMBINDEX, TROPONINI in the last 168 hours. BNP (last 3 results) No results for input(s): PROBNP in the last 8760 hours. HbA1C: No results for input(s): HGBA1C in the last 72 hours. CBG: No results for input(s): GLUCAP in the last 168 hours. Lipid Profile: No results for input(s): CHOL, HDL, LDLCALC, TRIG, CHOLHDL, LDLDIRECT in the last 72 hours. Thyroid Function Tests: No results for input(s): TSH, T4TOTAL, FREET4, T3FREE, THYROIDAB in the last 72 hours. Anemia Panel: No results for input(s): VITAMINB12, FOLATE, FERRITIN, TIBC, IRON, RETICCTPCT in the last 72 hours. Sepsis Labs: Recent Labs  Lab 02/01/21 0545  PROCALCITON <0.10     Recent Results (from the past 240 hour(s))  Resp Panel by RT-PCR (Flu A&B, Covid) Nasopharyngeal Swab     Status: Abnormal   Collection Time: 02/01/21  1:41 AM   Specimen: Nasopharyngeal Swab; Nasopharyngeal(NP) swabs in vial transport medium  Result Value Ref Range Status   SARS Coronavirus 2 by RT PCR POSITIVE (A) NEGATIVE Final    Comment: (NOTE) SARS-CoV-2 target nucleic acids are DETECTED.  The SARS-CoV-2 RNA is generally detectable in upper respiratory specimens during the acute phase of  infection. Positive results are indicative of the presence of the identified virus, but do not rule out bacterial infection or co-infection with other pathogens not detected by the test. Clinical correlation with patient history and other diagnostic information is necessary to determine patient infection status. The expected result is Negative.  Fact Sheet for Patients: EntrepreneurPulse.com.au  Fact Sheet for Healthcare Providers: IncredibleEmployment.be  This test is not yet approved or cleared by the [place] FDA and  has been authorized for detection and/or diagnosis of SARS-CoV-2 by FDA under an Emergency Use Authorization (EUA).  This EUA will remain in effect (meaning this test can be used) for the duration of  the COVID-19 declaration under Section 564(b)(1) of the A ct, 21 U.S.C. section 360bbb-3(b)(1), unless the authorization is terminated or revoked sooner.     Influenza A by PCR NEGATIVE NEGATIVE Final   Influenza B by PCR NEGATIVE NEGATIVE Final    Comment: (NOTE) The Xpert Xpress SARS-CoV-2/FLU/RSV plus assay is intended as an aid in  the diagnosis of influenza from Nasopharyngeal swab specimens and should not be used as a sole basis for treatment. Nasal washings and aspirates are unacceptable for Xpert Xpress SARS-CoV-2/FLU/RSV testing.  Fact Sheet for Patients: EntrepreneurPulse.com.au  Fact Sheet for Healthcare Providers: IncredibleEmployment.be  This test is not yet approved or cleared by the [place] FDA and has been authorized for detection and/or diagnosis of SARS-CoV-2 by FDA under an Emergency Use Authorization (EUA). This EUA will remain in effect (meaning this test can be used) for the duration of the COVID-19 declaration under Section 564(b)(1) of the Act, 21 U.S.C. section 360bbb-3(b)(1), unless the authorization is terminated or revoked.  Performed at [facility], [place] [address], [place], [place] [address]           Radiology Studies: No results found.      Scheduled Meds:  doxycycline  100 mg Oral Q12H   ipratropium-albuterol  3 mL Nebulization Q6H   losartan  50 mg Oral Daily   methylPREDNISolone (SOLU-MEDROL) injection  80 mg Intravenous Q12H   metoprolol succinate  25 mg Oral Daily   mometasone-formoterol  2 puff Inhalation BID   pantoprazole  40 mg Oral Daily   Continuous Infusions:        [name], MD [facility] 02/03/2021, 7:44 AM

## 2021-02-04 ENCOUNTER — Other Ambulatory Visit (HOSPITAL_COMMUNITY): Payer: Self-pay

## 2021-02-04 DIAGNOSIS — J441 Chronic obstructive pulmonary disease with (acute) exacerbation: Secondary | ICD-10-CM | POA: Diagnosis not present

## 2021-02-04 LAB — COMPREHENSIVE METABOLIC PANEL
ALT: 16 U/L (ref 0–44)
AST: 16 U/L (ref 15–41)
Albumin: 3.2 g/dL — ABNORMAL LOW (ref 3.5–5.0)
Alkaline Phosphatase: 115 U/L (ref 38–126)
Anion gap: 9 (ref 5–15)
BUN: 26 mg/dL — ABNORMAL HIGH (ref 6–20)
CO2: 28 mmol/L (ref 22–32)
Calcium: 9.3 mg/dL (ref 8.9–10.3)
Chloride: 101 mmol/L (ref 98–111)
Creatinine, Ser: 0.94 mg/dL (ref 0.61–1.24)
GFR, Estimated: >60 mL/min (ref >60)
Glucose, Bld: 258 mg/dL — ABNORMAL HIGH (ref 70–99)
Potassium: 4.4 mmol/L (ref 3.5–5.1)
Sodium: 138 mmol/L (ref 135–145)
Total Bilirubin: 0.4 mg/dL (ref 0.3–1.2)
Total Protein: 5.7 g/dL — ABNORMAL LOW (ref 6.5–8.1)

## 2021-02-04 LAB — C-REACTIVE PROTEIN: CRP: 0.7 mg/dL (ref <1.0)

## 2021-02-04 LAB — D-DIMER, QUANTITATIVE: D-Dimer, Quant: <0.27 ug/mL-FEU (ref 0.00–0.50)

## 2021-02-04 MED ORDER — FUROSEMIDE 10 MG/ML IJ SOLN: 20.0000 mg | Freq: Once | INTRAMUSCULAR | Status: AC

## 2021-02-04 MED ORDER — BENZONATATE 200 MG PO CAPS
200.0000 mg | ORAL_CAPSULE | Freq: Three times a day (TID) | ORAL | 0 refills | Status: DC | PRN
  Filled 2021-02-04: qty 20, 7d supply, fill #0

## 2021-02-04 MED ORDER — PREDNISONE 5 MG PO TABS
ORAL_TABLET | ORAL | 0 refills | Status: DC
  Filled 2021-02-04: qty 60, 15d supply, fill #0

## 2021-02-04 NOTE — TOC Transition Note (Signed)
Transition of Care [facility]) - CM/SW Discharge Note   Patient Details  Name: FINNIAN HUSTED MRN: [id number] Date of Birth: September 28, 1960  Transition of Care [facility]) CM/SW Contact:  Cyndi Bender, RN Phone Number: 02/04/2021, 12:45 PM   Clinical Narrative:     Patient stable for discharge. Patient needs transportation home. I have called for [facility] transport and the ride will arrive at the hospital at 1400. I have notified the patient's RN.  [facility] transport will call the 5W RN once they arrive.  Address, Phone number and PCP verified.   Final next level of care: Home/Self Care Barriers to Discharge: Barriers Resolved   Patient Goals and CMS Choice Patient states their goals for this hospitalization and ongoing recovery are:: return home      Discharge Placement                 home      Discharge Plan and Services   Discharge Planning Services: CM Consult                                 Social Determinants of Health (SDOH) Interventions     Readmission Risk Interventions Readmission Risk Prevention Plan 02/04/2021 08/14/2020 04/24/2020  Post Dischage Appt Complete - -  Medication Screening Complete - -  Transportation Screening Complete Complete Complete  PCP or Specialist Appt within 3-5 Days - Complete -  Not Complete comments - patient already has an appointment schedules with [name] -  [facility] or [place] for [place] Planning/Counseling - Complete -  Palliative Care Screening - Not Applicable -  Medication Review (RN Care Manager) - Complete -  PCP or Specialist appointment within 3-5 days of discharge - - Not Complete  PCP/Specialist Appt Not Complete comments - - Pt had existing appt 5/23--this is first available per [facility]  [place] or [place] - - Complete  SW Recovery Care/Counseling Consult - - Complete  [place] - - Not Applicable  Some recent  data might be hidden

## 2021-02-04 NOTE — Progress Notes (Signed)
Nursing dc note  Patient alert and oriented, verbalized understanding of dc instructions. TOC meds given to patient. Charge RN notified case worker to arrange for cab ride.

## 2021-02-04 NOTE — Consult Note (Signed)
° °  [facility] Inpatient Consult   02/04/2021  James [name] 09-22-1960 [id number]  Managed Medicaid:  [employer]  Patient reviewed for post hospital needs and he is active with the Managed Medicaid Care Management team.  Plan: No additional needs assessed, will notify MM RN for ongoing follow up.  Natividad Brood, RN BSN [facility] Liaison  [phone] business mobile phone Toll free office [phone]  Fax number: [phone] [email or web address]

## 2021-02-04 NOTE — Discharge Instructions (Signed)
Follow with Primary MD [name], MD in 7 days   Get CBC, CMP, 2 view Chest X ray -  checked next visit within 1 week by Primary MD   Activity: As tolerated with Full fall precautions use walker/cane & assistance as needed  Disposition Home    Diet: Heart Healthy    Special Instructions: If you have smoked or chewed Tobacco  in the last 2 yrs please stop smoking, stop any regular Alcohol  and or any Recreational drug use.  On your next visit with your primary care physician please Get Medicines reviewed and adjusted.  Please request your Prim.MD to go over all Hospital Tests and Procedure/Radiological results at the follow up, please get all Hospital records sent to your Prim MD by signing hospital release before you go home.  If you experience worsening of your admission symptoms, develop shortness of breath, life threatening emergency, suicidal or homicidal thoughts you must seek medical attention immediately by calling 911 or calling your MD immediately  if symptoms less severe.  You Must read complete instructions/literature along with all the possible adverse reactions/side effects for all the Medicines you take and that have been prescribed to you. Take any new Medicines after you have completely understood and accpet all the possible adverse reactions/side effects.

## 2021-02-04 NOTE — Discharge Summary (Signed)
James [name] VIF:[id number] DOB: [birth year] DOA: 02/01/2021  PCP: [name], MD  Admit date: 02/01/2021  Discharge date: 02/04/2021  Admitted From: Home   Disposition:  Home   Recommendations for Outpatient Follow-up:   Follow up with PCP in 1-2 weeks  PCP Please obtain BMP/CBC, 2 view CXR in 1week,  (see Discharge instructions)   PCP Please follow up on the following pending results:     Home Health: None Equipment/Devices: None  Consultations: None  Discharge Condition: Stable    CODE STATUS: Full    Diet Recommendation: Heart Healthy   Diet Order             Diet - low sodium heart healthy           Diet regular Room service appropriate? Yes; Fluid consistency: Thin  Diet effective now                    Chief Complaint  Patient presents with   Shortness of Breath     Brief history of present illness from the day of admission and additional interim summary    61 year old male with history of chronic respiratory failure with hypoxia on 2 L oxygen via nasal cannula, COPD, hypertension, GERD presented with worsening shortness of breath.  On presentation, COVID-19 testing was positive.  Chest x-ray showed minimal bibasilar airspace opacities favoring atelectasis without overt infiltrate.  He was started on IV Solu-Medrol.                                                                 Hospital Course   Acute hypoxic respiratory failure due to COPD exacerbation brought on by mild COVID-19 infection.  Infection itself was minimal with stable inflammatory markers, he initially required oxygen and IV steroids, now he is back to his baseline and symptom-free on room air with minimal wheezing.  He will be transition to oral steroid taper note he takes 10 mg of prednisone on a chronic basis which  will be continued, he does not require any COVID-19 specific treatment inflammatory markers were absolutely unremarkable.  He is now back to his baseline has been counseled to get COVID-vaccine in the next 4 to 6 weeks.  Says he quit smoking several years ago.  Will follow with his PCP within 7 to 10 days of discharge.    Atypical chest pain -Possibly from above.  No troponin elevation and EKG did not show signs of ischemia.  No further work-up needed.   Hypertension -Continue metoprolol succinate and losartan.  Blood pressure intermittently on the higher side  GERD -Continue Protonix   Discharge diagnosis     Principal Problem:   Acute exacerbation of chronic obstructive pulmonary disease (COPD) (HCC) Active Problems:   SOB (shortness of breath)  Hypertension   Chronic respiratory failure with hypoxia (HCC)   COVID-19 virus infection   Atypical chest pain   GERD (gastroesophageal reflux disease)    Discharge instructions    Discharge Instructions     Diet - low sodium heart healthy   Complete by: As directed    Discharge instructions   Complete by: As directed    Follow with Primary MD [name], MD in 7 days   Get CBC, CMP, 2 view Chest X ray -  checked next visit within 1 week by Primary MD   Activity: As tolerated with Full fall precautions use walker/cane & assistance as needed  Disposition Home    Diet: Heart Healthy    Special Instructions: If you have smoked or chewed Tobacco  in the last 2 yrs please stop smoking, stop any regular Alcohol  and or any Recreational drug use.  On your next visit with your primary care physician please Get Medicines reviewed and adjusted.  Please request your Prim.MD to go over all Hospital Tests and Procedure/Radiological results at the follow up, please get all Hospital records sent to your Prim MD by signing hospital release before you go home.  If you experience worsening of your admission symptoms, develop  shortness of breath, life threatening emergency, suicidal or homicidal thoughts you must seek medical attention immediately by calling 911 or calling your MD immediately  if symptoms less severe.  You Must read complete instructions/literature along with all the possible adverse reactions/side effects for all the Medicines you take and that have been prescribed to you. Take any new Medicines after you have completely understood and accpet all the possible adverse reactions/side effects.   Increase activity slowly   Complete by: As directed    MyChart COVID-19 home monitoring program   Complete by: Feb 04, 2021    Is the patient willing to use the [place] for home monitoring?: Yes   Temperature monitoring   Complete by: Feb 04, 2021    After how many days would you like to receive a notification of this patient's flowsheet entries?: 1       Discharge Medications   Allergies as of 02/04/2021       Reactions   Gabapentin Other (See Comments)   hallucinations   Metformin And Related Nausea Only   Pt states "it doesn't make him feel good"        Medication List     STOP taking these medications    doxycycline 100 MG tablet Commonly known as: VIBRA-TABS   predniSONE 20 MG tablet Commonly known as: DELTASONE Replaced by: predniSONE 5 MG tablet       TAKE these medications    benzonatate 200 MG capsule Commonly known as: TESSALON Take 1 capsule (200 mg total) by mouth 3 (three) times daily as needed for cough.   Flutter Devi Use 4 times daily   furosemide 20 MG tablet Commonly known as: LASIX Take 1 tablet (20 mg total) by mouth daily as needed for edema.   guaiFENesin 600 MG 12 hr tablet Commonly known as: MUCINEX Take 1 tablet (600 mg total) by mouth 2 (two) times daily as needed for cough or to loosen phlegm.   ibuprofen 600 MG tablet Commonly known as: ADVIL Take 600 mg by mouth every 6 (six) hours as needed for moderate pain.   losartan 50 MG  tablet Commonly known as: COZAAR Take 1 tablet (50 mg total) by mouth daily.   metoprolol  succinate 25 MG 24 hr tablet Commonly known as: TOPROL-XL Take 1 tablet (25 mg total) by mouth daily.   pantoprazole 40 MG tablet Commonly known as: PROTONIX Take 1 tablet (40 mg total) by mouth daily.   predniSONE 5 MG tablet Commonly known as: DELTASONE Label  & dispense according to the schedule below. take 8 Pills PO for 3 days, 6 Pills PO for 3 days, 4 Pills PO for 3 days, then go back to your 10 mg daily dose which you take at home and do not stop it till you see your pulmonary physician. Total 60 pills. Replaces: predniSONE 20 MG tablet   pregabalin 75 MG capsule Commonly known as: Lyrica Take 1 capsule (75 mg total) by mouth 2 (two) times daily.   Trelegy Ellipta 100-62.5-25 MCG/ACT Aepb Generic drug: Fluticasone-Umeclidin-Vilant Inhale 1 puff into the lungs daily.   Ventolin HFA 108 (90 Base) MCG/ACT inhaler Generic drug: albuterol Inhale 2 puffs into the lungs every 4 (four) hours as needed. What changed: reasons to take this   albuterol (2.5 MG/3ML) 0.083% nebulizer solution Commonly known as: PROVENTIL TAKE 3 MLS (2.5 MG TOTAL) BY NEBULIZATION IN THE MORNING, AT NOON, AND AT BEDTIME. What changed:  how much to take how to take this when to take this         Follow-up Information     [name], MD. Schedule an appointment as soon as possible for a visit in 1 week(s).   Specialty: Pulmonary Disease Contact information: [address] [place] [address] [phone]         O'Neal, Cassie Freer, MD .   Specialties: Cardiology, Internal Medicine, Radiology Contact information: [address] [place] [address] [phone]                 Major procedures and Radiology Reports - PLEASE review detailed and final reports thoroughly  -       DG Chest 2 View  Result Date: 01/26/2021 CLINICAL DATA:  Cough and wheezing for 3 weeks  history of pneumonia EXAM: CHEST - 2 VIEW COMPARISON:  Chest radiograph 12/31/2020 FINDINGS: The cardiomediastinal silhouette is stable. There are patchy opacities in the lingula, increased since the prior study. There is no other focal consolidation. There is no pulmonary edema. Remote left-sided rib fractures and left clavicle fracture are again noted. There is no evidence of acute osseous abnormality. IMPRESSION: Patchy opacities in the lingula are increased since the prior study from 12/31/2020, could reflect atelectasis but is suspicious for developing infection. Electronically Signed   By: Valetta Mole M.D.   On: 01/26/2021 16:34   DG Chest Portable 1 View  Result Date: 02/01/2021 CLINICAL DATA:  Shortness of breath EXAM: PORTABLE CHEST 1 VIEW COMPARISON:  Chest x-ray 01/26/2021 FINDINGS: There are minimal bibasilar opacities favored is atelectasis. There is no pleural effusion or pneumothorax. Cardiomediastinal silhouette is within normal limits. There are multiple healed left-sided rib fractures, unchanged. IMPRESSION: Minimal bibasilar atelectasis Electronically Signed   By: Ronney Asters M.D.   On: 02/01/2021 02:01     Today   Subjective   Patient in bed, appears comfortable, denies any headache, no fever, no chest pain or pressure, no shortness of breath , no abdominal pain. No new focal weakness.   Objective   Blood pressure (!) 151/91, pulse 74, temperature 98 F (36.7 C), temperature source Oral, resp. rate 18, height 5\' 9"  (1.753 m), weight 89.4 kg, SpO2 93 %.  No intake or output data  in the 24 hours ending 02/04/21 0920  Exam  Awake Alert, No new F.N deficits,    [place].AT,PERRAL Supple Neck,   Symmetrical Chest wall movement, Good air movement bilaterally, minimal to no wheezing RRR,No Gallops,   +ve B.Sounds, Abd Soft, Non tender,  No Cyanosis, Clubbing or edema    Data Review   CBC w Diff:  Lab Results  Component Value Date   WBC 11.7 (H) 02/01/2021   HGB 13.5  02/01/2021   HGB 13.0 09/03/2020   HCT 42.7 02/01/2021   HCT 40.6 09/03/2020   PLT 244 02/01/2021   PLT 320 09/03/2020   LYMPHOPCT 4 02/01/2021   MONOPCT 3 02/01/2021   EOSPCT 0 02/01/2021   BASOPCT 0 02/01/2021    CMP:  Lab Results  Component Value Date   NA 138 02/04/2021   NA 143 09/03/2020   K 4.4 02/04/2021   CL 101 02/04/2021   CO2 28 02/04/2021   BUN 26 (H) 02/04/2021   BUN 15 09/03/2020   CREATININE 0.94 02/04/2021   CREATININE 0.79 10/26/2016   PROT 5.7 (L) 02/04/2021   PROT 5.9 (L) 09/03/2020   ALBUMIN 3.2 (L) 02/04/2021   ALBUMIN 4.4 09/03/2020   BILITOT 0.4 02/04/2021   BILITOT 0.2 09/03/2020   ALKPHOS 115 02/04/2021   AST 16 02/04/2021   ALT 16 02/04/2021   ALT 62 (H) 06/23/2016  .   Total Time in preparing paper work, data evaluation and todays exam - 32 minutes  Lala Lund M.D on 02/04/2021 at 9:20 AM  [facility]

## 2021-02-05 ENCOUNTER — Telehealth: Payer: Self-pay

## 2021-02-05 ENCOUNTER — Telehealth: Payer: Self-pay | Admitting: *Deleted

## 2021-02-05 NOTE — Telephone Encounter (Signed)
Transition Care Management Follow-up Telephone Call Date of discharge and from where: 02/04/2021 - [place] How have you been since you were released from the hospital? "Feeling a little better" Any questions or concerns? No  Items Reviewed: Did the pt receive and understand the discharge instructions provided? Yes  Medications obtained and verified? Yes  Other? No  Any new allergies since your discharge? No  Dietary orders reviewed? No Do you have support at home? Yes    Functional Questionnaire: (I = Independent and D = Dependent) ADLs: I  Bathing/Dressing- I  Meal Prep- I  Eating- I  Maintaining continence- I  Transferring/Ambulation- I  Managing Meds- I  Follow up appointments reviewed:  PCP Hospital f/u appt confirmed? Yes  Scheduled to see [name] on 02/12/2021 @ 1030. [place] [facility] f/u appt confirmed? No   Are transportation arrangements needed? No  If their condition worsens, is the pt aware to call PCP or go to the Emergency Dept.? Yes Was the patient provided with contact information for the PCP's office or ED? Yes Was to pt encouraged to call back with questions or concerns? Yes

## 2021-02-05 NOTE — Telephone Encounter (Signed)
TOC has already been done.  This CM called patient # [phone] to make sure that he understands [facility] has moved and he is aware of the new clinic location. Voicemail was full, unable to leave a message

## 2021-02-06 ENCOUNTER — Telehealth: Payer: Self-pay

## 2021-02-06 ENCOUNTER — Telehealth: Payer: Self-pay | Admitting: *Deleted

## 2021-02-06 NOTE — Patient Outreach (Signed)
Care Coordination  02/06/2021  James [name] 07-27-1960 [id number]  RNCM calling to follow up after patient's recent hospital admission. RNCM unable to leave a voicemail. RNCM will follow up with patient at our next scheduled telephone outreach on 02/10/21 @ 10:30am.  [name] RN, [place] RN Care Coordinator

## 2021-02-06 NOTE — Telephone Encounter (Signed)
Called pt, ID confirmed , made sure that he knows that we are in a new office and scheduled time and day appoitment. Stated he was already aware. He stated he probably need to take a cab that his truck broke and is in service.

## 2021-02-10 ENCOUNTER — Other Ambulatory Visit: Payer: Self-pay | Admitting: *Deleted

## 2021-02-10 ENCOUNTER — Other Ambulatory Visit: Payer: Self-pay

## 2021-02-10 NOTE — Patient Outreach (Signed)
Medicaid Managed Care   Nurse Care Manager Note  02/10/2021 Name:  James [name] MRN:  [id number] DOB:  [birth year]  James [name] is an 62 y.o. year old male who is [name] primary patient of James Gaskins Burnett Harry, [name].  The [facility] Managed Care Coordination team was consulted for assistance with:    COPD +Covid  James [name] was given information about Medicaid Managed Care Coordination team services today. James [name] Patient agreed to services and verbal consent obtained.  Engaged with patient by telephone for follow up visit in response to provider referral for case management and/or care coordination services.   Assessments/Interventions:  Review of past medical history, allergies, medications, health status, including review of consultants reports, laboratory and other test data, was performed as part of comprehensive evaluation and provision of chronic care management services.  SDOH (Social Determinants of Health) assessments and interventions performed: SDOH Interventions    Flowsheet Row Most Recent Value  SDOH Interventions   Transportation Interventions Other (Comment)  [Patient's truck is in the shop, provided with transportation provided by [place]  Allergies  Allergen Reactions   Gabapentin Other (See Comments)    hallucinations   Metformin And Related Nausea Only    Pt states "it doesn't make him feel good"    Medications Reviewed Today     Reviewed by James Montane, RN (Registered Nurse) on 02/10/21 at 1016  Med List Status: <None>   Medication Order Taking? Sig Documenting Provider Last Dose Status Informant  albuterol (PROVENTIL) (2.5 MG/3ML) 0.083% nebulizer solution [id number] Yes TAKE 3 MLS (2.5 MG TOTAL) BY NEBULIZATION IN THE MORNING, AT NOON, AND AT BEDTIME.  Patient taking differently: Take 2.5 mg by nebulization in the morning, at noon, and at bedtime.   James [name] Taking Active Self, Pharmacy Records            Med Note (James [name]   Thu Dec 22, [name] 10:08 AM) Only using at bedtime  albuterol (VENTOLIN HFA) 108 (90 Base) MCG/ACT inhaler [id number] Yes Inhale 2 puffs into the lungs every 4 (four) hours as needed.  Patient taking differently: Inhale 2 puffs into the lungs every 4 (four) hours as needed for wheezing.   James [name] Taking Active Self, Pharmacy Records  benzonatate [facility]) 200 MG capsule [id number] Yes Take 1 capsule (200 mg total) by mouth 3 (three) times daily as needed for cough. James [name] Taking Active   Fluticasone-Umeclidin-[facility] ELLIPTA) 100-62.5-25 MCG/ACT AEPB [id number] Yes Inhale 1 puff into the lungs daily. James [name] Taking Active Self, Pharmacy Records  furosemide (LASIX) 20 MG tablet [id number] Yes Take 1 tablet (20 mg total) by mouth daily as needed for edema. James [name] Taking Active Self, Pharmacy Records  guaiFENesin [facility]) 600 MG 12 hr tablet [id number] Yes Take 1 tablet (600 mg total) by mouth 2 (two) times daily as needed for cough or to loosen phlegm. James [name], [name] Taking Active Self, Pharmacy Records  ibuprofen (ADVIL) 600 MG tablet [id number] No Take 600 mg by mouth every 6 (six) hours as needed for moderate pain.  Patient not taking: Reported on 02/10/2021   Provider, Historical, [name] Not Taking Active Self, Pharmacy Records  losartan (COZAAR) 50 MG tablet [id number] Yes Take 1 tablet (50 mg total) by mouth daily. James [name] Taking Active Self, Pharmacy Records  metoprolol succinate (TOPROL-XL) 25 MG 24 hr  tablet [id number] Yes Take 1 tablet (25 mg total) by mouth daily. James [name] Taking Active Self, Pharmacy Records  pantoprazole (PROTONIX) 40 MG tablet [id number] Yes Take 1 tablet (40 mg total) by mouth daily. James [name] Taking Active Self, Pharmacy Records  predniSONE (DELTASONE) 5 MG tablet [id number] Yes Take 8 tabs by mouth daily for 3 days, 6 tabs daily for 3 days, 4 tabs daily  for 3 days, then go back to your 10 mg daily dose which you take at home and do not stop it till you see your pulmonary physician James [name] Taking Active   pregabalin (LYRICA) 75 MG capsule [id number] Yes Take 1 capsule (75 mg total) by mouth 2 (two) times daily. James [name] Taking Active Self, Pharmacy Records           Med Note James [name], James [name]   Thu Dec 22, [name] 10:09 AM) Taking once [name] day  Respiratory Therapy Supplies (FLUTTER) DEVI [id number] Yes Use 4 times daily James [name] Taking Active Self, Pharmacy Records            Patient Active Problem List   Diagnosis Date Noted   Acute exacerbation of chronic obstructive pulmonary disease (COPD) ([place]) 02/01/2021   Atypical chest pain 02/01/2021   GERD (gastroesophageal reflux disease) 02/01/2021   Neuropathy 09/26/[name]   Obesity (BMI 30.0-34.9) 04/16/[name]   COVID-19 virus infection 10/19/2019   Bronchiectasis ([name]) 09/24/2019   Type 2 diabetes mellitus without complication, without long-term current use of insulin ([place]) 07/24/2019   Other atopic dermatitis 05/01/2019   Chronic respiratory failure with hypoxia ([place]) 04/23/2019   Intellectual disability 04/23/2019   History of alcohol use 11/23/2018   Hypertension 05/17/2016   History of tobacco abuse 04/06/2016   COPD mixed type ([place]) 03/26/2016   SOB (shortness of breath)     Conditions to be addressed/monitored per PCP order:  COPD and +Covid  Care Plan : RN Care Manager Plan of Care  Updates made by James Montane, RN since 02/10/2021 12:00 AM     Problem: Chronic Disease Management needs in patient with COPD   Priority: High     Long-Range Goal: Plan of care established for for Chronic Disease management for patient with COPD   Start Date: 11/10/[name]  Expected End Date: 03/03/2021  Priority: High  Note:   Current Barriers:  Care Coordination needs related to Limited access to food  Chronic Disease Management support and education needs  related to COPD James [name] was hospitalized on 02/01/21 for SOB, +Covid. He is at home and feeling better. He reports having all prescribed medications. He does not have transportation to his next appointment, due to his truck being in the shop.   RNCM Clinical Goal(s):  Patient will verbalize understanding of plan for management of COPD as evidenced by taking all medications as prescribed, attending all scheduled appointments and contacting provider with questions and concerns take all medications exactly as prescribed and will call provider for medication related questions as evidenced by refilling medications and taking as prescribed    attend all scheduled medical appointments: 02/12/21 at 10:30am and 03/23/21 at 10am with [name] as evidenced by provider notes in EMR        demonstrate improved adherence to prescribed treatment plan for COPD as evidenced by taking all medications as prescribed, attending all scheduled appointments and contacting provider with questions and concerns continue to work with RN Care Manager and/or Social Worker  to address care management and care coordination needs related to COPD as evidenced by adherence to CM Team Scheduled appointments     through collaboration with RN Care manager, provider, and care team.   Interventions: Inter-disciplinary care team collaboration (see longitudinal plan of care) Evaluation of current treatment plan related to  self management and patient's adherence to plan as established by provider   SDOH Barriers (Status:  Goal Met. Patient interviewed and SDOH assessment performed        SDOH Interventions    Flowsheet Row Most Recent Value  SDOH Interventions   Transportation Interventions Other (Comment)  [Patient's truck is in the shop, provided with transportation provided by [facility] [phone]     Patient interviewed and appropriate assessments performed Discussed plans with patient for ongoing care management follow  up and provided patient with direct contact information for care management team- RNCM assisted with arranging transportation provided by [facility], with [employer] [phone], to hospital follow up appointment with [name], pick up time 9:25-9:40am on 02/12/21, confirmation #[id number]   COPD: (Status: Goal on Track (progressing): YES.) Long Term Goal  Reviewed medications with patient, including use of prescribed maintenance and rescue inhalers, and provided instruction on medication management and the importance of adherence Advised patient to use nebulizer as directed, three times daily Advised patient to track and manage COPD triggers Advised patient to self assesses COPD action plan zone and make appointment with provider if in the yellow zone for 48 hours without improvement Provided education about and advised patient to utilize infection prevention strategies to reduce risk of respiratory infection Assessed social determinant of health barriers Advised patient to support his chest with [name] pillow when coughing    COVID Interventions:  (Status:  New goal.) Short Term Goal Provided education to patient to enhance basic understanding of COVID-19 as [name] viral disease, measures to prevent exposure, signs and symptoms, recommended vaccine schedule, when to contact provider Counseled on importance of regular laboratory monitoring as prescribed Provided FDA vaccine guidelines Assessed social determinant of health barriers  Patient Goals/Self-Care Activities: Take medications as prescribed   Attend all scheduled provider appointments Call pharmacy for medication refills 3-7 days in advance of running out of medications Perform all self care activities independently  Call provider office for new concerns or questions  - avoid second hand smoke - limit outdoor activity during cold weather - keep follow-up appointments: 02/12/21 @ 10:30am with [name] - eat healthy/prescribed diet: DASH - get at  least 7 to 8 hours of sleep at night       Follow Up:  Patient agrees to Care Plan and Follow-up.  Plan: The Managed Medicaid care management team will reach out to the patient again over the next 30 days.  Date/time of next scheduled RN care management/care coordination outreach:  03/16/21 @ 10:30am  [name] RN, BSN [place] RN Care Coordinator

## 2021-02-10 NOTE — Patient Instructions (Signed)
Visit Information  James [name] was given information about Medicaid Managed Care team care coordination services as a part of their Healthy [facility] Medicaid benefit. James [name] verbally consented to engagement with the [facility] Managed Care team.   If you are experiencing a medical emergency, please call 911 or report to your local emergency department or urgent care.   If you have a non-emergency medical problem during routine business hours, please contact your provider's office and ask to speak with a nurse.   For questions related to your Healthy [facility] health plan, please call: [phone] or visit the homepage here: [email or web address]  If you would like to schedule transportation through your Healthy [facility] plan, please call the following number at least 2 days in advance of your appointment: [phone]  Call the [place] at [phone], at any time, 24 hours a day, 7 days a week. If you are in danger or need immediate medical attention call 911.  If you would like help to quit smoking, call 1-800-QUIT-NOW (934) 282-9410) OR Espaol: 1-855-Djelo-Ya (0-383-338-3291) o para ms informacin haga clic aqu or Text READY to 200-400 to register via text  James [name],   Please see education materials related to Covid instructions provided as print materials.   The patient verbalized understanding of instructions provided today and agreed to receive a mailed copy of patient instruction and/or educational materials.  Telephone follow up appointment with Managed Medicaid care management team member scheduled for:03/16/21 @ 10:30am  [name] RN, BSN [name] RN Care Coordinator   Following is a copy of your plan of care:  Care Plan : RN Care Manager Plan of Care  Updates made by [name], RN since 02/10/2021 12:00 AM     Problem: Chronic Disease Management needs in patient  with COPD   Priority: High     Long-Range Goal: Plan of care established for for Chronic Disease management for patient with COPD   Start Date: 11/13/2020  Expected End Date: 03/03/2021  Priority: High  Note:   Current Barriers:  Care Coordination needs related to Limited access to food  Chronic Disease Management support and education needs related to COPD James [name] was hospitalized on 02/01/21 for SOB, +Covid. He is at home and feeling better. He reports having all prescribed medications. He does not have transportation to his next appointment, due to his truck being in the shop.   RNCM Clinical Goal(s):  Patient will verbalize understanding of plan for management of COPD as evidenced by taking all medications as prescribed, attending all scheduled appointments and contacting provider with questions and concerns take all medications exactly as prescribed and will call provider for medication related questions as evidenced by refilling medications and taking as prescribed    attend all scheduled medical appointments: 02/12/21 at 10:30am and 03/23/21 at 10am with [name] as evidenced by provider notes in EMR        demonstrate improved adherence to prescribed treatment plan for COPD as evidenced by taking all medications as prescribed, attending all scheduled appointments and contacting provider with questions and concerns continue to work with RN Care Manager and/or Social Worker to address care management and care coordination needs related to COPD as evidenced by adherence to CM Team Scheduled appointments     through collaboration with RN Care manager, provider, and care team.   Interventions: Inter-disciplinary care team collaboration (see longitudinal plan of care) Evaluation of current treatment plan related to  self management and patient's adherence to plan as established by provider   SDOH Barriers (Status:  Goal Met. Patient interviewed and SDOH assessment performed        SDOH  Interventions    Flowsheet Row Most Recent Value  SDOH Interventions   Transportation Interventions Other (Comment)  [Patient's truck is in the shop, provided with transportation provided by [name] [phone]     Patient interviewed and appropriate assessments performed Discussed plans with patient for ongoing care management follow up and provided patient with direct contact information for care management team- RNCM assisted with arranging transportation provided by [facility], with [employer] [phone], to hospital follow up appointment with [name], pick up time 9:25-9:40am on 02/12/21, confirmation #[id number]   COPD: (Status: Goal on Track (progressing): YES.) Long Term Goal  Reviewed medications with patient, including use of prescribed maintenance and rescue inhalers, and provided instruction on medication management and the importance of adherence Advised patient to use nebulizer as directed, three times daily Advised patient to track and manage COPD triggers Advised patient to self assesses COPD action plan zone and make appointment with provider if in the yellow zone for 48 hours without improvement Provided education about and advised patient to utilize infection prevention strategies to reduce risk of respiratory infection Assessed social determinant of health barriers Advised patient to support his chest with a pillow when coughing    COVID Interventions:  (Status:  New goal.) Short Term Goal Provided education to patient to enhance basic understanding of COVID-19 as a viral disease, measures to prevent exposure, signs and symptoms, recommended vaccine schedule, when to contact provider Counseled on importance of regular laboratory monitoring as prescribed Provided FDA vaccine guidelines Assessed social determinant of health barriers  Patient Goals/Self-Care Activities: Take medications as prescribed   Attend all scheduled provider appointments Call pharmacy for  medication refills 3-7 days in advance of running out of medications Perform all self care activities independently  Call provider office for new concerns or questions  - avoid second hand smoke - limit outdoor activity during cold weather - keep follow-up appointments: 02/12/21 @ 10:30am with [name] - eat healthy/prescribed diet: DASH - get at least 7 to 8 hours of sleep at night

## 2021-02-12 ENCOUNTER — Other Ambulatory Visit: Payer: Self-pay

## 2021-02-12 ENCOUNTER — Ambulatory Visit: Payer: Medicaid Other | Attending: Critical Care Medicine | Admitting: Critical Care Medicine

## 2021-02-12 ENCOUNTER — Encounter: Payer: Self-pay | Admitting: Critical Care Medicine

## 2021-02-12 VITALS — BP 133/78 | HR 92 | Resp 16 | Wt 205.4 lb

## 2021-02-12 DIAGNOSIS — G629 Polyneuropathy, unspecified: Secondary | ICD-10-CM | POA: Diagnosis not present

## 2021-02-12 DIAGNOSIS — I10 Essential (primary) hypertension: Secondary | ICD-10-CM | POA: Diagnosis not present

## 2021-02-12 DIAGNOSIS — J449 Chronic obstructive pulmonary disease, unspecified: Secondary | ICD-10-CM | POA: Diagnosis not present

## 2021-02-12 DIAGNOSIS — E119 Type 2 diabetes mellitus without complications: Secondary | ICD-10-CM

## 2021-02-12 DIAGNOSIS — J9611 Chronic respiratory failure with hypoxia: Secondary | ICD-10-CM

## 2021-02-12 DIAGNOSIS — U071 COVID-19: Secondary | ICD-10-CM

## 2021-02-12 LAB — GLUCOSE, POCT (MANUAL RESULT ENTRY): POC Glucose: 148 mg/dl — AB (ref 70–99)

## 2021-02-12 MED ORDER — PREDNISONE 5 MG PO TABS
10.0000 mg | ORAL_TABLET | Freq: Every day | ORAL | 4 refills | Status: DC
  Filled 2021-02-12 – 2021-02-18 (×3): qty 60, 30d supply, fill #0

## 2021-02-12 NOTE — Assessment & Plan Note (Signed)
Continue gabapentin.

## 2021-02-12 NOTE — Assessment & Plan Note (Signed)
As per chronic bronchitis

## 2021-02-12 NOTE — Assessment & Plan Note (Signed)
Acute exacerbation has resolved he will stay on a 10 mg daily dose of prednisone continue inhaled medications as prescribed continue oxygen therapy as prescribed

## 2021-02-12 NOTE — Patient Instructions (Signed)
Stay on prednisone 10mg  daily, refill sent at 10mg  daily  No other medication changes  Obtain a chest xray downstairs before leaving  Labs today metabolic panel /blood counts  Return [name] 2 months

## 2021-02-12 NOTE — Assessment & Plan Note (Signed)
Continue oxygen therapy.

## 2021-02-12 NOTE — Progress Notes (Signed)
Established Patient Office Visit  Subjective:  Patient ID: James [name], male    DOB: 1960-02-15  Age: 61 y.o. MRN: [id number]  CC:  Chief Complaint  Patient presents with   Diabetes    HPI [name] presents for posthospital follow-up and diabetes management.  Patient was in the hospital again with [place] he had fallen and injured his chest he was taken to the hospital found to have COVID and COPD exacerbation hypoxic respiratory failure below is a copy of the discharge summary  Admit date: 02/01/2021  Discharge date: 02/04/2021   Admitted From: Home   Disposition:  Home     Recommendations for Outpatient Follow-up:    Follow up with PCP in 1-2 weeks   PCP Please obtain BMP/CBC, 2 view CXR in 1week,  (see Discharge instructions)    PCP Please follow up on the following pending results:       Home Health: None Equipment/Devices: None  Consultations: None  Discharge Condition: Stable    CODE STATUS: Full    Diet Recommendation: Heart Healthy    Diet Order                  Diet - low sodium heart healthy             Diet regular Room service appropriate? Yes; Fluid consistency: Thin  Diet effective now                            Chief Complaint  Patient presents with   Shortness of Breath      Brief history of present illness from the day of admission and additional interim summary     61 year old male with history of chronic respiratory failure with hypoxia on 2 L oxygen via nasal cannula, COPD, hypertension, GERD presented with worsening shortness of breath.  On presentation, COVID-19 testing was positive.  Chest x-ray showed minimal bibasilar airspace opacities favoring atelectasis without overt infiltrate.  He was started on IV Solu-Medrol.                                                                  Hospital Course    Acute hypoxic respiratory failure due to COPD exacerbation brought on by mild COVID-19 infection.  Infection itself was minimal  with stable inflammatory markers, he initially required oxygen and IV steroids, now he is back to his baseline and symptom-free on room air with minimal wheezing.  He will be transition to oral steroid taper note he takes 10 mg of prednisone on a chronic basis which will be continued, he does not require any COVID-19 specific treatment inflammatory markers were absolutely unremarkable.  He is now back to his baseline has been counseled to get COVID-vaccine in the next 4 to 6 weeks.  Says he quit smoking several years ago.  Will follow with his PCP within 7 to 10 days of discharge.     Atypical chest pain -Possibly from above.  No troponin elevation and EKG did not show signs of ischemia.  No further work-up needed.   Hypertension -Continue metoprolol succinate and losartan.  Blood pressure intermittently on the higher side  GERD -Continue Protonix  Discharge diagnosis       Principal Problem:   Acute exacerbation of chronic obstructive pulmonary disease (COPD) (HCC) Active Problems:   SOB (shortness of breath)   Hypertension   Chronic respiratory failure with hypoxia (HCC)   COVID-19 virus infection   Atypical chest pain   GERD (gastroesophageal reflux disease)   Patient did have a transitional care phone visit with our clinic case manager on 2 February.  He states he still coughing some still has some mucus productive yellow otherwise he is doing well he is more ambulatory his chest pain is now resolved he does need follow-up labs and a chest x-ray at this visit.  He is tapered down to 5 mg daily of his prednisone.  He has no more COVID symptoms.    Past Medical History:  Diagnosis Date   Acute on chronic respiratory failure with hypoxia ([place]) 06/08/2013   CAP (community acquired pneumonia) 05/17/2018   See admit 05/17/18 ? rml  with   covid pcr neg - rx augmentin > f/u cxr in 4-6 weeks is fine unless condition declines    Community acquired pneumonia of right lower lobe of  lung 05/17/2018   See admit 05/17/18 ? 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   Tick bite 06/03/2018    Past Surgical History:  Procedure Laterality Date   LUNG REMOVAL, PARTIAL     VIDEO ASSISTED THORACOSCOPY (VATS)/THOROCOTOMY Left 06/11/2013   Procedure: VIDEO ASSISTED THORACOSCOPY (VATS)/THOROCOTOMY;  Surgeon: Ivin Poot, MD;  Location: [place];  Service: Thoracic;  Laterality: Left;   VIDEO BRONCHOSCOPY N/A 06/11/2013   Procedure: VIDEO BRONCHOSCOPY;  Surgeon: Ivin Poot, MD;  Location: [place];  Service: Thoracic;  Laterality: N/A;   VIDEO BRONCHOSCOPY N/A 08/10/2018   Procedure: VIDEO BRONCHOSCOPY WITH FLUORO;  Surgeon: Candee Furbish, MD;  Location: [facility] ENDOSCOPY;  Service: Endoscopy;  Laterality: N/A;    Family History  Problem Relation Age of Onset   Heart disease Father    COPD Sister     Social History   Socioeconomic History   Marital status: Divorced    Spouse name: Not on file   Number of children: Not on file   Years of education: Not on file   Highest education level: Not on file  Occupational History   Occupation: Dealer   Occupation: Curator  Tobacco Use   Smoking status: Former    Packs/day: 1.50    Years: 40.00    Pack years: 60.00    Types: Cigarettes    Quit date: 2014    Years since quitting: 9.1   Smokeless tobacco: Former    Types: Nurse, children's Use: Never used  Substance and Sexual Activity   Alcohol use: Not Currently    Alcohol/week: 28.0 standard drinks    Types: 28 Cans of beer per week    Comment: 4 beers a night; + jitters with not drinking, denies DTs or  seizures   Drug use: Not Currently    Types: Marijuana  Comment: daily; quit using crack and methamphetamine about 5 months ago    Sexual activity: Not Currently    Partners: Female  Other Topics Concern   Not on file  Social History Narrative   Lives alone near [place] Determinants of Health   Financial Resource Strain: Low Risk    Difficulty of Paying Living Expenses: Not very hard  Food Insecurity: Food Insecurity Present   Worried About [place] in the Last Year: Sometimes true   Ran Out of Food in the Last Year: Sometimes true  Transportation Needs: No Transportation Needs   Lack of Transportation (Medical): No   Lack of Transportation (Non-Medical): No  Physical Activity: Inactive   Days of Exercise per Week: 0 days   Minutes of Exercise per Session: 0 min  Stress: No Stress Concern Present   Feeling of Stress : Not at all  Social Connections: Moderately Integrated   Frequency of Communication with Friends and Family: More than three times a week   Frequency of Social Gatherings with Friends and Family: Once a week   Attends Religious Services: 1 to 4 times per year   Active Member of [employer] or Organizations: Yes   Attends Archivist Meetings: 1 to 4 times per year   Marital Status: Divorced  Human resources officer Violence: Not on file    Outpatient Medications Prior to Visit  Medication Sig Dispense Refill   albuterol (PROVENTIL) (2.5 MG/3ML) 0.083% nebulizer solution TAKE 3 MLS (2.5 MG TOTAL) BY NEBULIZATION IN THE MORNING, AT NOON, AND AT BEDTIME. (Patient taking differently: Take 2.5 mg by nebulization in the morning, at noon, and at bedtime.) 270 mL 1   albuterol (VENTOLIN HFA) 108 (90 Base) MCG/ACT inhaler Inhale 2 puffs into the lungs every 4 (four) hours as needed. (Patient taking differently: Inhale 2 puffs into the lungs every 4 (four) hours as needed for wheezing.) 18 g 0   benzonatate (TESSALON) 200 MG capsule Take 1 capsule (200  mg total) by mouth 3 (three) times daily as needed for cough. 20 capsule 0   Fluticasone-Umeclidin-Vilant (TRELEGY ELLIPTA) 100-62.5-25 MCG/ACT AEPB Inhale 1 puff into the lungs daily. 60 each 2   furosemide (LASIX) 20 MG tablet Take 1 tablet (20 mg total) by mouth daily as needed for edema. 30 tablet 2   guaiFENesin (MUCINEX) 600 MG 12 hr tablet Take 1 tablet (600 mg total) by mouth 2 (two) times daily as needed for cough or to loosen phlegm.     ibuprofen (ADVIL) 600 MG tablet Take 600 mg by mouth every 6 (six) hours as needed for moderate pain.     losartan (COZAAR) 50 MG tablet Take 1 tablet (50 mg total) by mouth daily. 90 tablet 1   metoprolol succinate (TOPROL-XL) 25 MG 24 hr tablet Take 1 tablet (25 mg total) by mouth daily. 90 tablet 3   pantoprazole (PROTONIX) 40 MG tablet Take 1 tablet (40 mg total) by mouth daily. 60 tablet 6   pregabalin (LYRICA) 75 MG capsule Take 1 capsule (75 mg total) by mouth 2 (two) times daily. 60 capsule 1   Respiratory Therapy Supplies (FLUTTER) DEVI Use 4 times daily 1 each 0   predniSONE (DELTASONE) 5 MG tablet Take 8 tabs by mouth daily for 3 days, 6 tabs daily for 3 days, 4 tabs daily for 3 days, then go back to your 10 mg daily dose which you take at home and do not stop it till you see  your pulmonary physician (Patient taking differently: Take 5 mg by mouth daily with breakfast.) 60 tablet 0   No facility-administered medications prior to visit.    Allergies  Allergen Reactions   Gabapentin Other (See Comments)    hallucinations   Metformin And Related Nausea Only    Pt states "it doesn't make him feel good"    ROS Review of Systems  Constitutional:  Negative for fatigue and fever.  HENT: Negative.  Negative for rhinorrhea, sinus pressure and sinus pain.   Eyes: Negative.   Respiratory:  Positive for cough and shortness of breath. Negative for chest tightness and wheezing.   Cardiovascular:  Negative for chest pain.  Gastrointestinal:  Negative.  Negative for constipation, diarrhea, nausea and vomiting.  Genitourinary: Negative.  Negative for dysuria and hematuria.  Musculoskeletal: Negative.   Skin: Negative.   Neurological: Negative.  Negative for dizziness, syncope and weakness.  Psychiatric/Behavioral: Negative.  Negative for dysphoric mood and sleep disturbance. The patient is not nervous/anxious.      Objective:    Physical Exam Constitutional:      Appearance: Normal appearance. He is normal weight.  HENT:     Head: Normocephalic and atraumatic.     Right Ear: External ear normal.     Left Ear: External ear normal.  Eyes:     General: No scleral icterus. Cardiovascular:     Rate and Rhythm: Normal rate and regular rhythm.     Pulses: Normal pulses.     Heart sounds: Normal heart sounds. No murmur heard.   No friction rub. No gallop.  Pulmonary:     Effort: Pulmonary effort is normal.     Breath sounds: Wheezing present. No rhonchi or rales.  Neurological:     General: No focal deficit present.     Mental Status: He is alert and oriented to person, place, and time.  Psychiatric:        Mood and Affect: Mood normal.        Behavior: Behavior normal.        Thought Content: Thought content normal.        Judgment: Judgment normal.    BP 133/78    Pulse 92    Resp 16    Wt 205 lb 6.4 oz (93.2 kg)    SpO2 100%    BMI 30.33 kg/m  Wt Readings from Last 3 Encounters:  02/12/21 205 lb 6.4 oz (93.2 kg)  02/01/21 197 lb 1.5 oz (89.4 kg)  12/31/20 229 lb 4.5 oz (104 kg)     There are no preventive care reminders to display for this patient.  There are no preventive care reminders to display for this patient.  Lab Results  Component Value Date   TSH 0.325 (L) 04/19/2020   Lab Results  Component Value Date   WBC 11.7 (H) 02/01/2021   HGB 13.5 02/01/2021   HCT 42.7 02/01/2021   MCV 94.9 02/01/2021   PLT 244 02/01/2021   Lab Results  Component Value Date   NA 138 02/04/2021   K 4.4 02/04/2021    CO2 28 02/04/2021   GLUCOSE 258 (H) 02/04/2021   BUN 26 (H) 02/04/2021   CREATININE 0.94 02/04/2021   BILITOT 0.4 02/04/2021   ALKPHOS 115 02/04/2021   AST 16 02/04/2021   ALT 16 02/04/2021   PROT 5.7 (L) 02/04/2021   ALBUMIN 3.2 (L) 02/04/2021   CALCIUM 9.3 02/04/2021   ANIONGAP 9 02/04/2021   EGFR 88 09/03/2020   No  results found for: CHOL No results found for: HDL No results found for: [facility] Lab Results  Component Value Date   TRIG 95 10/21/2019   No results found for: [facility] Lab Results  Component Value Date   HGBA1C 6.5 12/22/2020      Assessment & Plan:   Problem List Items Addressed This Visit       Cardiovascular and Mediastinum   Hypertension (Chronic)   Relevant Orders   Comprehensive metabolic panel   CBC with Differential/Platelet     Respiratory   COPD mixed type (HCC)    As per chronic bronchitis      Relevant Medications   predniSONE (DELTASONE) 5 MG tablet   Other Relevant Orders   DG Chest 2 View   CBC with Differential/Platelet   Chronic respiratory failure with hypoxia (HCC)    Continue oxygen therapy      Relevant Orders   DG Chest 2 View   CBC with Differential/Platelet   COPD with chronic bronchitis (HCC)    Acute exacerbation has resolved he will stay on a 10 mg daily dose of prednisone continue inhaled medications as prescribed continue oxygen therapy as prescribed      Relevant Medications   predniSONE (DELTASONE) 5 MG tablet     Endocrine   Type 2 diabetes mellitus without complication, without long-term current use of insulin (HCC) - Primary    Stable on metformin      Relevant Orders   POCT glucose (manual entry) (Completed)   Comprehensive metabolic panel     Nervous and Auditory   Neuropathy    Continue gabapentin        Other   RESOLVED: COVID-19 virus infection    This has resolved      Relevant Orders   DG Chest 2 View    Meds ordered this encounter  Medications   predniSONE (DELTASONE) 5  MG tablet    Sig: Take 2 tablets (10 mg total) by mouth daily with breakfast. Take 8 tabs by mouth daily for 3 days, 6 tabs daily for 3 days, 4 tabs daily for 3 days, then go back to your 10 mg daily dose which you take at home and do not stop it till you see your pulmonary physician    Dispense:  60 tablet    Refill:  4    Follow-up: Return in about 2 months (around 04/12/2021).    Asencion Noble, MD

## 2021-02-12 NOTE — Assessment & Plan Note (Signed)
Stable on metformin

## 2021-02-12 NOTE — Assessment & Plan Note (Signed)
This has resolved.

## 2021-02-13 ENCOUNTER — Other Ambulatory Visit: Payer: Self-pay

## 2021-02-13 ENCOUNTER — Other Ambulatory Visit: Payer: Self-pay | Admitting: Critical Care Medicine

## 2021-02-13 ENCOUNTER — Telehealth: Payer: Self-pay

## 2021-02-13 LAB — CBC WITH DIFFERENTIAL/PLATELET
Basophils Absolute: 0 10*3/uL (ref 0.0–0.2)
Basos: 0 %
EOS (ABSOLUTE): 0.1 10*3/uL (ref 0.0–0.4)
Eos: 1 %
Hematocrit: 41.2 % (ref 37.5–51.0)
Hemoglobin: 13.7 g/dL (ref 13.0–17.7)
Immature Grans (Abs): 0.2 10*3/uL — ABNORMAL HIGH (ref 0.0–0.1)
Immature Granulocytes: 1 %
Lymphocytes Absolute: 1.8 10*3/uL (ref 0.7–3.1)
Lymphs: 10 %
MCH: 29.8 pg (ref 26.6–33.0)
MCHC: 33.3 g/dL (ref 31.5–35.7)
MCV: 90 fL (ref 79–97)
Monocytes Absolute: 1.1 10*3/uL — ABNORMAL HIGH (ref 0.1–0.9)
Monocytes: 6 %
Neutrophils Absolute: 15.8 10*3/uL — ABNORMAL HIGH (ref 1.4–7.0)
Neutrophils: 82 %
Platelets: 254 10*3/uL (ref 150–450)
RBC: 4.59 x10E6/uL (ref 4.14–5.80)
RDW: 12.7 % (ref 11.6–15.4)
WBC: 19 10*3/uL — ABNORMAL HIGH (ref 3.4–10.8)

## 2021-02-13 LAB — COMPREHENSIVE METABOLIC PANEL
ALT: 20 IU/L (ref 0–44)
AST: 14 IU/L (ref 0–40)
Albumin/Globulin Ratio: 2.2 (ref 1.2–2.2)
Albumin: 3.9 g/dL (ref 3.8–4.9)
Alkaline Phosphatase: 114 IU/L (ref 44–121)
BUN/Creatinine Ratio: 15 (ref 9–20)
BUN: 11 mg/dL (ref 6–24)
Bilirubin Total: 0.3 mg/dL (ref 0.0–1.2)
CO2: 25 mmol/L (ref 20–29)
Calcium: 9.1 mg/dL (ref 8.7–10.2)
Chloride: 100 mmol/L (ref 96–106)
Creatinine, Ser: 0.73 mg/dL — ABNORMAL LOW (ref 0.76–1.27)
Globulin, Total: 1.8 g/dL (ref 1.5–4.5)
Glucose: 207 mg/dL — ABNORMAL HIGH (ref 70–99)
Potassium: 4.1 mmol/L (ref 3.5–5.2)
Sodium: 139 mmol/L (ref 134–144)
Total Protein: 5.7 g/dL — ABNORMAL LOW (ref 6.0–8.5)
eGFR: 105 mL/min/{1.73_m2} (ref >59)

## 2021-02-13 MED ORDER — CEFDINIR 300 MG PO CAPS
300.0000 mg | ORAL_CAPSULE | Freq: Two times a day (BID) | ORAL | 0 refills | Status: DC
  Filled 2021-02-13: qty 14, 7d supply, fill #0

## 2021-02-13 NOTE — Telephone Encounter (Signed)
Pt was called and is aware of results, DOB was confirmed   [name] :  He also wanted to know what lung infection he had? I looked through the notes and I didn't want to tell him anything wrong

## 2021-02-13 NOTE — Telephone Encounter (Signed)
-----   Message from [name], MD sent at 02/13/2021  6:17 AM EST ----- Let pt know he still has lung infection based on blood work.  Rest of blood work is ok I am sending another Rx to our pharmacy

## 2021-02-13 NOTE — Telephone Encounter (Signed)
He had covid in hospital now he has bacterial secondary infxn

## 2021-02-16 NOTE — Telephone Encounter (Signed)
Called pt and he is aware thank you !

## 2021-02-18 ENCOUNTER — Other Ambulatory Visit: Payer: Self-pay

## 2021-02-20 ENCOUNTER — Other Ambulatory Visit: Payer: Self-pay

## 2021-02-20 ENCOUNTER — Emergency Department (HOSPITAL_COMMUNITY): Payer: Medicaid Other

## 2021-02-20 ENCOUNTER — Inpatient Hospital Stay (HOSPITAL_COMMUNITY)
Admission: EM | Admit: 2021-02-20 | Discharge: 2021-02-22 | DRG: 871 | Disposition: A | Discharge status: Home / Self Care | Payer: Medicaid Other | Attending: Family Medicine | Admitting: Family Medicine

## 2021-02-20 ENCOUNTER — Encounter (HOSPITAL_COMMUNITY): Payer: Self-pay

## 2021-02-20 DIAGNOSIS — Z825 Family history of asthma and other chronic lower respiratory diseases: Secondary | ICD-10-CM

## 2021-02-20 DIAGNOSIS — Y95 Nosocomial condition: Secondary | ICD-10-CM | POA: Diagnosis present

## 2021-02-20 DIAGNOSIS — J449 Chronic obstructive pulmonary disease, unspecified: Secondary | ICD-10-CM | POA: Diagnosis present

## 2021-02-20 DIAGNOSIS — A419 Sepsis, unspecified organism: Principal | ICD-10-CM

## 2021-02-20 DIAGNOSIS — Z9981 Dependence on supplemental oxygen: Secondary | ICD-10-CM | POA: Diagnosis not present

## 2021-02-20 DIAGNOSIS — J189 Pneumonia, unspecified organism: Secondary | ICD-10-CM | POA: Diagnosis not present

## 2021-02-20 DIAGNOSIS — R0902 Hypoxemia: Secondary | ICD-10-CM | POA: Diagnosis not present

## 2021-02-20 DIAGNOSIS — R0689 Other abnormalities of breathing: Secondary | ICD-10-CM | POA: Diagnosis not present

## 2021-02-20 DIAGNOSIS — Z888 Allergy status to other drugs, medicaments and biological substances status: Secondary | ICD-10-CM

## 2021-02-20 DIAGNOSIS — E119 Type 2 diabetes mellitus without complications: Secondary | ICD-10-CM

## 2021-02-20 DIAGNOSIS — R6520 Severe sepsis without septic shock: Secondary | ICD-10-CM | POA: Diagnosis present

## 2021-02-20 DIAGNOSIS — J441 Chronic obstructive pulmonary disease with (acute) exacerbation: Secondary | ICD-10-CM | POA: Diagnosis present

## 2021-02-20 DIAGNOSIS — U071 COVID-19: Secondary | ICD-10-CM | POA: Diagnosis not present

## 2021-02-20 DIAGNOSIS — R069 Unspecified abnormalities of breathing: Secondary | ICD-10-CM | POA: Diagnosis not present

## 2021-02-20 DIAGNOSIS — Z8616 Personal history of COVID-19: Secondary | ICD-10-CM | POA: Diagnosis not present

## 2021-02-20 DIAGNOSIS — Z79899 Other long term (current) drug therapy: Secondary | ICD-10-CM

## 2021-02-20 DIAGNOSIS — J440 Chronic obstructive pulmonary disease with acute lower respiratory infection: Secondary | ICD-10-CM | POA: Diagnosis present

## 2021-02-20 DIAGNOSIS — R0602 Shortness of breath: Secondary | ICD-10-CM | POA: Diagnosis not present

## 2021-02-20 DIAGNOSIS — R0600 Dyspnea, unspecified: Secondary | ICD-10-CM

## 2021-02-20 DIAGNOSIS — J9611 Chronic respiratory failure with hypoxia: Secondary | ICD-10-CM | POA: Diagnosis present

## 2021-02-20 DIAGNOSIS — I10 Essential (primary) hypertension: Secondary | ICD-10-CM | POA: Diagnosis present

## 2021-02-20 DIAGNOSIS — Z8249 Family history of ischemic heart disease and other diseases of the circulatory system: Secondary | ICD-10-CM | POA: Diagnosis not present

## 2021-02-20 DIAGNOSIS — R918 Other nonspecific abnormal finding of lung field: Secondary | ICD-10-CM | POA: Diagnosis not present

## 2021-02-20 DIAGNOSIS — Z902 Acquired absence of lung [part of]: Secondary | ICD-10-CM

## 2021-02-20 DIAGNOSIS — R000 Tachycardia, unspecified: Secondary | ICD-10-CM | POA: Diagnosis not present

## 2021-02-20 DIAGNOSIS — Z87891 Personal history of nicotine dependence: Secondary | ICD-10-CM | POA: Diagnosis not present

## 2021-02-20 HISTORY — DX: Pneumonia, unspecified organism: J18.9

## 2021-02-20 LAB — COMPREHENSIVE METABOLIC PANEL
ALT: 30 U/L (ref 0–44)
AST: 24 U/L (ref 15–41)
Albumin: 3.8 g/dL (ref 3.5–5.0)
Alkaline Phosphatase: 98 U/L (ref 38–126)
Anion gap: 10 (ref 5–15)
BUN: 12 mg/dL (ref 6–20)
CO2: 29 mmol/L (ref 22–32)
Calcium: 9 mg/dL (ref 8.9–10.3)
Chloride: 103 mmol/L (ref 98–111)
Creatinine, Ser: 1.17 mg/dL (ref 0.61–1.24)
GFR, Estimated: >60 mL/min (ref >60)
Glucose, Bld: 209 mg/dL — ABNORMAL HIGH (ref 70–99)
Potassium: 4.2 mmol/L (ref 3.5–5.1)
Sodium: 142 mmol/L (ref 135–145)
Total Bilirubin: 0.4 mg/dL (ref 0.3–1.2)
Total Protein: 6.3 g/dL — ABNORMAL LOW (ref 6.5–8.1)

## 2021-02-20 LAB — I-STAT CHEM 8, ED
BUN: 16 mg/dL (ref 6–20)
Calcium, Ion: 1.18 mmol/L (ref 1.15–1.40)
Chloride: 103 mmol/L (ref 98–111)
Creatinine, Ser: 1 mg/dL (ref 0.61–1.24)
Glucose, Bld: 200 mg/dL — ABNORMAL HIGH (ref 70–99)
HCT: 49 % (ref 39.0–52.0)
Hemoglobin: 16.7 g/dL (ref 13.0–17.0)
Potassium: 4.2 mmol/L (ref 3.5–5.1)
Sodium: 142 mmol/L (ref 135–145)
TCO2: 31 mmol/L (ref 22–32)

## 2021-02-20 LAB — CBC WITH DIFFERENTIAL/PLATELET
Abs Immature Granulocytes: 0.01 10*3/uL (ref 0.00–0.07)
Basophils Absolute: 0 10*3/uL (ref 0.0–0.1)
Basophils Relative: 0 %
Eosinophils Absolute: 0.1 10*3/uL (ref 0.0–0.5)
Eosinophils Relative: 1 %
HCT: 47.8 % (ref 39.0–52.0)
Hemoglobin: 15.2 g/dL (ref 13.0–17.0)
Immature Granulocytes: 0 %
Lymphocytes Relative: 11 %
Lymphs Abs: 1 10*3/uL (ref 0.7–4.0)
MCH: 30.6 pg (ref 26.0–34.0)
MCHC: 31.8 g/dL (ref 30.0–36.0)
MCV: 96.2 fL (ref 80.0–100.0)
Monocytes Absolute: 0.2 10*3/uL (ref 0.1–1.0)
Monocytes Relative: 3 %
Neutro Abs: 7.8 10*3/uL — ABNORMAL HIGH (ref 1.7–7.7)
Neutrophils Relative %: 85 %
Platelets: 297 10*3/uL (ref 150–400)
RBC: 4.97 MIL/uL (ref 4.22–5.81)
RDW: 13.5 % (ref 11.5–15.5)
WBC: 9.1 10*3/uL (ref 4.0–10.5)
nRBC: 0 % (ref 0.0–0.2)

## 2021-02-20 LAB — RESP PANEL BY RT-PCR (FLU A&B, COVID) ARPGX2
Influenza A by PCR: NEGATIVE
Influenza B by PCR: NEGATIVE
SARS Coronavirus 2 by RT PCR: POSITIVE — AB

## 2021-02-20 LAB — I-STAT ARTERIAL BLOOD GAS, ED
Acid-Base Excess: 0 mmol/L (ref 0.0–2.0)
Bicarbonate: 26.6 mmol/L (ref 20.0–28.0)
Calcium, Ion: 1.23 mmol/L (ref 1.15–1.40)
HCT: 45 % (ref 39.0–52.0)
Hemoglobin: 15.3 g/dL (ref 13.0–17.0)
O2 Saturation: 98 %
Potassium: 4.1 mmol/L (ref 3.5–5.1)
Sodium: 139 mmol/L (ref 135–145)
TCO2: 28 mmol/L (ref 22–32)
pCO2 arterial: 50.2 mmHg — ABNORMAL HIGH (ref 32–48)
pH, Arterial: 7.332 — ABNORMAL LOW (ref 7.35–7.45)
pO2, Arterial: 105 mmHg (ref 83–108)

## 2021-02-20 LAB — TROPONIN I (HIGH SENSITIVITY): Troponin I (High Sensitivity): 26 ng/L — ABNORMAL HIGH (ref <18)

## 2021-02-20 LAB — PROTIME-INR
INR: 0.9 (ref 0.8–1.2)
Prothrombin Time: 12.6 seconds (ref 11.4–15.2)

## 2021-02-20 LAB — GLUCOSE, CAPILLARY: Glucose-Capillary: 203 mg/dL — ABNORMAL HIGH (ref 70–99)

## 2021-02-20 LAB — LACTIC ACID, PLASMA: Lactic Acid, Venous: 2.3 mmol/L (ref 0.5–1.9)

## 2021-02-20 LAB — ETHANOL: Alcohol, Ethyl (B): <10 mg/dL (ref <10)

## 2021-02-20 MED ORDER — POLYETHYLENE GLYCOL 3350 17 G PO PACK: 17.0000 g | PACK | Freq: Every day | ORAL | Status: DC | PRN

## 2021-02-20 MED ORDER — METHYLPREDNISOLONE SODIUM SUCC 125 MG IJ SOLR
120.0000 mg | INTRAMUSCULAR | Status: DC
  Administered 2021-02-20: 120 mg via INTRAVENOUS
  Filled 2021-02-20: qty 2

## 2021-02-20 MED ORDER — LORAZEPAM 2 MG/ML IJ SOLN: 0.5000 mg | Freq: Once | INTRAMUSCULAR | Status: AC

## 2021-02-20 MED ORDER — LOSARTAN POTASSIUM 50 MG PO TABS
50.0000 mg | ORAL_TABLET | Freq: Every day | ORAL | Status: DC
  Administered 2021-02-21 – 2021-02-22 (×2): 50 mg via ORAL
  Filled 2021-02-20 (×2): qty 1

## 2021-02-20 MED ORDER — ENOXAPARIN SODIUM 40 MG/0.4ML IJ SOSY
40.0000 mg | PREFILLED_SYRINGE | INTRAMUSCULAR | Status: DC
  Administered 2021-02-20 – 2021-02-21 (×2): 40 mg via SUBCUTANEOUS
  Filled 2021-02-20 (×2): qty 0.4

## 2021-02-20 MED ORDER — PANTOPRAZOLE SODIUM 40 MG PO TBEC
40.0000 mg | DELAYED_RELEASE_TABLET | Freq: Every day | ORAL | Status: DC
  Administered 2021-02-20 – 2021-02-22 (×3): 40 mg via ORAL
  Filled 2021-02-20 (×3): qty 1

## 2021-02-20 MED ORDER — DOCUSATE SODIUM 100 MG PO CAPS
100.0000 mg | ORAL_CAPSULE | Freq: Two times a day (BID) | ORAL | Status: DC
  Administered 2021-02-20 – 2021-02-22 (×4): 100 mg via ORAL
  Filled 2021-02-20 (×4): qty 1

## 2021-02-20 MED ORDER — ONDANSETRON HCL 4 MG/2ML IJ SOLN
4.0000 mg | Freq: Four times a day (QID) | INTRAMUSCULAR | Status: DC | PRN
Start: 2021-02-20 — End: 2021-02-22

## 2021-02-20 MED ORDER — SODIUM CHLORIDE 0.9 % IV SOLN
2.0000 g | Freq: Three times a day (TID) | INTRAVENOUS | Status: DC
  Administered 2021-02-20 – 2021-02-22 (×6): 2 g via INTRAVENOUS
  Filled 2021-02-20 (×7): qty 2

## 2021-02-20 MED ORDER — PREGABALIN 75 MG PO CAPS
75.0000 mg | ORAL_CAPSULE | Freq: Every day | ORAL | Status: DC
  Administered 2021-02-20 – 2021-02-22 (×3): 75 mg via ORAL
  Filled 2021-02-20: qty 1
  Filled 2021-02-20: qty 3
  Filled 2021-02-20: qty 1

## 2021-02-20 MED ORDER — SODIUM CHLORIDE 0.9 % IV SOLN
2.0000 g | Freq: Once | INTRAVENOUS | Status: AC
  Administered 2021-02-20: 2 g via INTRAVENOUS
  Filled 2021-02-20: qty 2

## 2021-02-20 MED ORDER — VANCOMYCIN HCL 750 MG/150ML IV SOLN
750.0000 mg | Freq: Two times a day (BID) | INTRAVENOUS | Status: DC
  Administered 2021-02-20 – 2021-02-21 (×2): 750 mg via INTRAVENOUS
  Filled 2021-02-20 (×3): qty 150

## 2021-02-20 MED ORDER — LORAZEPAM 2 MG/ML IJ SOLN: 0.5000 mg | Freq: Once | INTRAMUSCULAR | Status: DC

## 2021-02-20 MED ORDER — UMECLIDINIUM BROMIDE 62.5 MCG/ACT IN AEPB
1.0000 | INHALATION_SPRAY | Freq: Every day | RESPIRATORY_TRACT | Status: DC
  Administered 2021-02-20 – 2021-02-22 (×3): 1 via RESPIRATORY_TRACT
  Filled 2021-02-20 (×2): qty 7

## 2021-02-20 MED ORDER — ONDANSETRON HCL 4 MG PO TABS: 4.0000 mg | ORAL_TABLET | Freq: Four times a day (QID) | ORAL | Status: DC | PRN

## 2021-02-20 MED ORDER — OXYCODONE HCL 5 MG PO TABS: 5.0000 mg | ORAL_TABLET | ORAL | Status: DC | PRN

## 2021-02-20 MED ORDER — VANCOMYCIN HCL 1750 MG/350ML IV SOLN
1750.0000 mg | Freq: Once | INTRAVENOUS | Status: AC
  Administered 2021-02-20: 1750 mg via INTRAVENOUS
  Filled 2021-02-20 (×2): qty 350

## 2021-02-20 MED ORDER — FLUTICASONE FUROATE-VILANTEROL 100-25 MCG/ACT IN AEPB
1.0000 | INHALATION_SPRAY | Freq: Every day | RESPIRATORY_TRACT | Status: DC
  Administered 2021-02-20 – 2021-02-22 (×3): 1 via RESPIRATORY_TRACT
  Filled 2021-02-20 (×2): qty 28

## 2021-02-20 MED ORDER — GUAIFENESIN ER 600 MG PO TB12: 600.0000 mg | ORAL_TABLET | Freq: Two times a day (BID) | ORAL | Status: DC | PRN

## 2021-02-20 MED ORDER — ALBUTEROL SULFATE (2.5 MG/3ML) 0.083% IN NEBU: 2.5000 mg | INHALATION_SOLUTION | RESPIRATORY_TRACT | Status: DC | PRN

## 2021-02-20 MED ORDER — ACETAMINOPHEN 325 MG PO TABS: 650.0000 mg | ORAL_TABLET | Freq: Four times a day (QID) | ORAL | Status: DC | PRN

## 2021-02-20 MED ORDER — INSULIN ASPART 100 UNIT/ML IJ SOLN
0.0000 [IU] | Freq: Three times a day (TID) | INTRAMUSCULAR | Status: DC
  Administered 2021-02-21: 2 [IU] via SUBCUTANEOUS
  Administered 2021-02-21: 3 [IU] via SUBCUTANEOUS
  Administered 2021-02-21: 5 [IU] via SUBCUTANEOUS
  Administered 2021-02-22: 2 [IU] via SUBCUTANEOUS
  Administered 2021-02-22: 5 [IU] via SUBCUTANEOUS

## 2021-02-20 MED ORDER — LORAZEPAM 2 MG/ML IJ SOLN
INTRAMUSCULAR | Status: AC
  Administered 2021-02-20: 0.5 mg via INTRAVENOUS
  Filled 2021-02-20: qty 1

## 2021-02-20 MED ORDER — SODIUM CHLORIDE 0.9 % IV BOLUS
500.0000 mL | Freq: Once | INTRAVENOUS | Status: AC
  Administered 2021-02-20: 500 mL via INTRAVENOUS

## 2021-02-20 MED ORDER — MORPHINE SULFATE (PF) 2 MG/ML IV SOLN: 2.0000 mg | INTRAVENOUS | Status: DC | PRN

## 2021-02-20 MED ORDER — LACTATED RINGERS IV SOLN: INTRAVENOUS | Status: AC

## 2021-02-20 MED ORDER — ACETAMINOPHEN 650 MG RE SUPP: 650.0000 mg | Freq: Four times a day (QID) | RECTAL | Status: DC | PRN

## 2021-02-20 MED ORDER — BISACODYL 5 MG PO TBEC: 5.0000 mg | DELAYED_RELEASE_TABLET | Freq: Every day | ORAL | Status: DC | PRN

## 2021-02-20 MED ORDER — HYDRALAZINE HCL 20 MG/ML IJ SOLN
5.0000 mg | INTRAMUSCULAR | Status: DC | PRN
Start: 2021-02-20 — End: 2021-02-22

## 2021-02-20 MED ORDER — SODIUM CHLORIDE 0.9% FLUSH
3.0000 mL | Freq: Two times a day (BID) | INTRAVENOUS | Status: DC
  Administered 2021-02-20 – 2021-02-22 (×5): 3 mL via INTRAVENOUS

## 2021-02-20 MED ORDER — METOPROLOL SUCCINATE ER 25 MG PO TB24
25.0000 mg | ORAL_TABLET | Freq: Every day | ORAL | Status: DC
  Administered 2021-02-20 – 2021-02-22 (×3): 25 mg via ORAL
  Filled 2021-02-20 (×3): qty 1

## 2021-02-20 MED ORDER — SODIUM CHLORIDE 0.9 % IV BOLUS
1000.0000 mL | Freq: Once | INTRAVENOUS | Status: AC
  Administered 2021-02-20: 1000 mL via INTRAVENOUS

## 2021-02-20 NOTE — Plan of Care (Signed)
°  Problem: Education: Goal: Knowledge of disease or condition will improve Outcome: Progressing Goal: Knowledge of the prescribed therapeutic regimen will improve Outcome: Progressing Goal: Individualized Educational Video(s) Outcome: Progressing   Problem: Elimination: Goal: Will not experience complications related to bowel motility Outcome: Progressing Goal: Will not experience complications related to urinary retention Outcome: Progressing   Problem: Pain Managment: Goal: General experience of comfort will improve Outcome: Progressing   Problem: Safety: Goal: Ability to remain free from injury will improve Outcome: Progressing   Problem: Skin Integrity: Goal: Risk for impaired skin integrity will decrease Outcome: Progressing

## 2021-02-20 NOTE — ED Notes (Signed)
Report given to [place], [place].

## 2021-02-20 NOTE — Progress Notes (Signed)
Pharmacy Antibiotic Note  IDUS RATHKE is a 61 y.o. male admitted on 02/20/2021 presenting with SOB on BiPAP, concern for sepsis.  Pharmacy has been consulted for vancomycin and cefepime dosing.  Plan: Vancomycin 1750 mg IV x 1, then 750 mg IV q 12h (eAUC 529, Goal AUC 400-550, SCr 1.17) Add MRSA PCR Cefepime 2g IV q 8 hours Monitor renal function, Cx/PCR to narrow Vancomycin levels as indicated     No data recorded.  Recent Labs  Lab 02/20/21 0850 02/20/21 0905  WBC 9.1  --   CREATININE 1.17 1.00  LATICACIDVEN 2.3*  --     Estimated Creatinine Clearance: 89.7 mL/min (by C-G formula based on SCr of 1 mg/dL).    Allergies  Allergen Reactions   Gabapentin Other (See Comments)    hallucinations   Metformin And Related Nausea Only    Pt states "it doesn't make him feel good"    Bertis Ruddy, PharmD Clinical Pharmacist ED Pharmacist Phone # [phone] 02/20/2021 9:55 AM

## 2021-02-20 NOTE — Assessment & Plan Note (Signed)
-  Patient presenting with cough, acute onset of SOB, and infiltrate in left lower lobe on chest x-ray -Infiltrate on CXR and 2-3 characteristics (fever, leukocytosis, purulent sputum) are consistent with pneumonia. -This appears to be most likely healthcare-associated pneumonia in the setting of recent COVID-19 infection (COVID test is still positive but CT is 33, indicating that he is no longer infectious).  -He has underlying COPD and may also have a component of exacerbation there -Blood cultures are pending -CURB-65 score is 1  -Pneumonia Severity Index (PSI) is Class 4, 9% mortality. -Will start Cefepime and Vanc for now -Additional complicating factors include: hypoxia; worsening of co-morbid condition (COPD). -LR @ 75cc/hr -Fever control -Repeat CBC in am -Will add Mucinex for cough -Will admit to progressive care bed -F/u CXR recommended in 3-4 weeks -If not improving/resolving, needs pulmonology consult

## 2021-02-20 NOTE — H&P (Signed)
History and Physical    [name]:[id number] DOB: 03-28-1960 DOA: 02/20/2021 DOS: the patient was seen and examined on 02/20/2021 PCP: [name], MD  Patient coming from: Home; NOK: Christianne Borrow, [phone]   Chief Complaint: SOB  HPI: James [name] is a 61 y.o. male with medical history significant of COPD on 2-4L home O2; ETOH dependence; DM; and HTN presenting with SOB. He was most recently hospitalized from 1/29-2/1 with COPD exacerbation related to COVID-19 infection.  He was treated with O2 and IV steroids.  He reports that he was doing pretty well and felt good yesterday but awoke about 0600 with acute SOB and cough.  He is better now, on BIPAP, and asking for food/drink.    ER Course:  Woke up this AM with SOB.  CPAP, Solumedrol, Mag++, Ativan given with some improvement.  CXR looks worse than prior.  Given Cefepime and Vanc.     Review of Systems: As mentioned in the history of present illness. All other systems reviewed and are negative.  Limited by BIPAP.  Past Medical History:  Diagnosis Date   Acute on chronic respiratory failure with hypoxia ([place]) 06/08/2013   CAP (community acquired pneumonia) 05/17/2018   See admit 05/17/18 ? rml  with   covid pcr neg - rx augmentin > f/u cxr in 4-6 weeks is fine unless condition declines    Community acquired pneumonia of right lower lobe of lung 05/17/2018   See admit 05/17/18 ? 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   Tick bite 06/03/2018   Past Surgical History:  Procedure Laterality Date   LUNG REMOVAL, PARTIAL     VIDEO ASSISTED  THORACOSCOPY (VATS)/THOROCOTOMY Left 06/11/2013   Procedure: VIDEO ASSISTED THORACOSCOPY (VATS)/THOROCOTOMY;  Surgeon: Ivin Poot, MD;  Location: [facility];  Service: Thoracic;  Laterality: Left;   VIDEO BRONCHOSCOPY N/A 06/11/2013   Procedure: VIDEO BRONCHOSCOPY;  Surgeon: Ivin Poot, MD;  Location: [facility];  Service: Thoracic;  Laterality: N/A;   VIDEO BRONCHOSCOPY N/A 08/10/2018   Procedure: VIDEO BRONCHOSCOPY WITH FLUORO;  Surgeon: Candee Furbish, MD;  Location[facility] ENDOSCOPY;  Service: Endoscopy;  Laterality: N/A;   Social History:  reports that he quit smoking about 9 years ago. His smoking use included cigarettes. He has a 60.00 pack-year smoking history. He has quit using smokeless tobacco.  His smokeless tobacco use included chew. He reports that he does not currently use alcohol after a past usage of about 28.0 standard drinks per week. He reports that he does not currently use drugs after having used the following drugs: Marijuana.  Allergies  Allergen Reactions   Gabapentin Other (See Comments)    hallucinations   Metformin And Related Nausea Only    Pt states "it doesn't make him feel good"    Family History  Problem Relation Age of Onset   Heart disease Father    COPD Sister     Prior to Admission medications   Medication Sig Start Date End Date Taking? Authorizing Provider  albuterol (  PROVENTIL) (2.5 MG/3ML) 0.083% nebulizer solution TAKE 3 MLS (2.5 MG TOTAL) BY NEBULIZATION IN THE MORNING, AT NOON, AND AT BEDTIME. Patient taking differently: Take 2.5 mg by nebulization in the morning, at noon, and at bedtime. 12/22/20 12/22/21  [name], MD  albuterol (VENTOLIN HFA) 108 (90 Base) MCG/ACT inhaler Inhale 2 puffs into the lungs every 4 (four) hours as needed. Patient taking differently: Inhale 2 puffs into the lungs every 4 (four) hours as needed for wheezing. 12/22/20   [name], MD  benzonatate (TESSALON) 200 MG capsule Take 1 capsule (200 mg total)  by mouth 3 (three) times daily as needed for cough. 02/04/21   Thurnell Lose, MD  cefdinir (OMNICEF) 300 MG capsule Take 1 capsule (300 mg total) by mouth 2 (two) times daily. 02/13/21   [name], MD  Fluticasone-Umeclidin-Vilant (TRELEGY ELLIPTA) 100-62.5-25 MCG/ACT AEPB Inhale 1 puff into the lungs daily. 12/22/20   [name], MD  furosemide (LASIX) 20 MG tablet Take 1 tablet (20 mg total) by mouth daily as needed for edema. 12/22/20 12/22/21  [name], MD  guaiFENesin (MUCINEX) 600 MG 12 hr tablet Take 1 tablet (600 mg total) by mouth 2 (two) times daily as needed for cough or to loosen phlegm. 06/29/20 06/29/21  [name], MD  ibuprofen (ADVIL) 600 MG tablet Take 600 mg by mouth every 6 (six) hours as needed for moderate pain.    [provider]  losartan (COZAAR) 50 MG tablet Take 1 tablet (50 mg total) by mouth daily. 12/22/20   [name], MD  metoprolol succinate (TOPROL-XL) 25 MG 24 hr tablet Take 1 tablet (25 mg total) by mouth daily. 12/22/20 12/22/21  [name], MD  pantoprazole (PROTONIX) 40 MG tablet Take 1 tablet (40 mg total) by mouth daily. 12/22/20   [name], MD  predniSONE (DELTASONE) 5 MG tablet Take 2 tablets (10 mg total) by mouth daily with breakfast. Take 8 tabs by mouth daily for 3 days, 6 tabs daily for 3 days, 4 tabs daily for 3 days, then go back to your 10 mg daily dose which you take at home and do not stop it till you see your pulmonary physician 02/12/21   [name], MD  pregabalin (LYRICA) 75 MG capsule Take 1 capsule (75 mg total) by mouth 2 (two) times daily. 12/22/20   [name], MD  Respiratory Therapy Supplies (FLUTTER) DEVI Use 4 times daily 04/27/18   [name], MD    Physical Exam: Vitals:   02/20/21 1115 02/20/21 1200 02/20/21 1608 02/20/21 1652  BP: 131/86 116/73 135/74 119/70  Pulse: (!) 123 (!) 127 (!) 101 92  Resp: (!) 25 (!) 24 (!) 23 18  Temp:    99.2 F (37.3 C)   TempSrc:    Oral  SpO2: 100% 96% 98% 99%  Weight:    90.6 kg  Height:    5\' 9"  (1.753 m)   General:  Appears calm and comfortable and is in NAD, on BIPAP  Eyes:  EOMI, normal lids, iris ENT:  mmm; edentulous; BIPAP mask in place Neck:  no LAD, masses or thyromegaly Cardiovascular:  RRR, no m/r/g. No LE edema.  Respiratory:   CTA with somewhat decreased air movement, on BIPAP. Mildly increased respiratory effort. Abdomen:  soft, NT, ND Skin:  no rash or induration seen on limited exam Musculoskeletal:  grossly normal tone BUE/BLE, good ROM, no bony abnormality Psychiatric:  blunted  mood and affect, speech difficult to understand with BIPAP mask but appears to be appropriate Neurologic:  moves all extremities in coordinated fashion   Radiological Exams on Admission: Independently reviewed - see discussion in A/P where applicable  DG Chest Port 1 View  Result Date: 02/20/2021 CLINICAL DATA:  Shortness of breath. EXAM: PORTABLE CHEST 1 VIEW COMPARISON:  February 01, 2021. FINDINGS: The heart size and mediastinal contours are within normal limits. Multiple old left rib fractures are noted. Right lung is clear. Large left lower lobe airspace opacity is noted consistent with pneumonia. IMPRESSION: Large left lower lobe airspace opacity consistent with pneumonia. Followup PA and lateral chest X-ray is recommended in 3-4 weeks following trial of antibiotic therapy to ensure resolution and exclude underlying malignancy. Electronically Signed   By: Marijo Conception M.D.   On: 02/20/2021 08:53    EKG: Independently reviewed.   834 - Sinus tachycardia with rate 169; artifact with nonspecific ST changes 944 - Sinus tachycardia with rate 142; no evidence of acute ischemia   Labs on Admission: I have personally reviewed the available labs and imaging studies at the time of the admission.  Pertinent labs:    ABG: 7.332/50.2/105 Glucose 209 HS troponin 26 Lactate 2.3 Normal CBC INR 0.9 ETOH  <10 Blood cultures pending    Assessment and Plan: * HCAP (healthcare-associated pneumonia)- (present on admission) -Patient presenting with cough, acute onset of SOB, and infiltrate in left lower lobe on chest x-ray -Infiltrate on CXR and 2-3 characteristics (fever, leukocytosis, purulent sputum) are consistent with pneumonia. -This appears to be most likely healthcare-associated pneumonia in the setting of recent COVID-19 infection (COVID test is still positive but CT is 33, indicating that he is no longer infectious).  -He has underlying COPD and may also have a component of exacerbation there -Blood cultures are pending -CURB-65 score is 1  -Pneumonia Severity Index (PSI) is Class 4, 9% mortality. -Will start Cefepime and Vanc for now -Additional complicating factors include: hypoxia; worsening of co-morbid condition (COPD). -LR @ 75cc/hr -Fever control -Repeat CBC in am -Will add Mucinex for cough -Will admit to progressive care bed -F/u CXR recommended in 3-4 weeks -If not improving/resolving, needs pulmonology consult  COPD mixed type ([place])- (present on admission) -He has history of O2-dependent COPD but had increased O2 requirement overnight, transitioned by EMS to CPAP and on BIPAP at the time of my evaluation -He has LLL PNA but there may also be a COPD component -Nebulizers: prn albuterol -Solu-Medrol 60 mg IV BID (takes 5 mg prednisone daily, likely needs burst or taper) -Continue Trelegy (formular change to Incruse and Breo) -Coordinated care with TOC team/PT/OT/Nutrition/RT consults  Type 2 diabetes mellitus without complication, without long-term current use of insulin (HCC) -Last A1c was 6.5 -Not taking home meds -Cover with moderate-scale SSI  Hypertension- (present on admission) -Continue Toprol, Cozaar -Will add prn IV hydralazine      Advance Care Planning:   Code Status: Full Code   Consults: TOC team/PT/OT/Nutrition/RT consults  DVT Prophylaxis:  Lovenox  Family Communication: Attempted to call his sister, unable to leave a message  Severity of Illness: The appropriate patient status for this patient is INPATIENT. Inpatient status is judged to be reasonable and necessary in order to provide the required intensity of service to ensure the patient's safety. The patient's presenting symptoms, physical exam findings, and initial radiographic and laboratory data in the context of their chronic comorbidities is felt to place them at high risk for further  clinical deterioration. Furthermore, it is not anticipated that the patient will be medically stable for discharge from the hospital within 2 midnights of admission.   * I certify that at the point of admission it is my clinical judgment that the patient will require inpatient hospital care spanning beyond 2 midnights from the point of admission due to high intensity of service, high risk for further deterioration and high frequency of surveillance required.*  Author: Karmen Bongo, MD 02/20/2021 6:28 PM  For on call review [email or web address].

## 2021-02-20 NOTE — Progress Notes (Signed)
Pt minimally arousable, open eyes when calls by name but drifts back to sleep quickly. Pt noticed to be pursed lip breathing, 99% on 4L [place]. RT was called to assess pt if Bipap is needed.

## 2021-02-20 NOTE — Assessment & Plan Note (Signed)
-  Continue Toprol, Cozaar -Will add prn IV hydralazine

## 2021-02-20 NOTE — ED Triage Notes (Signed)
Pt BIB [facility] from home d/t sudden onset of recurrent SOB this morning. Hx of COPD. EMS arrived & pt was 81% on RA, 240/110, ST on monitor, CBG 223, A/Ox4, placed on CPAP with 10 of PEEP (per EMS) & his O2 increased to 93%. While en route to ED pt was given 5 Albuterol, 1 Atrovent, 2 of Mag, & 125 Solu-Medrol.

## 2021-02-20 NOTE — Assessment & Plan Note (Signed)
-  Last A1c was 6.5 -Not taking home meds -Cover with moderate-scale SSI

## 2021-02-20 NOTE — ED Provider Notes (Signed)
[facility] EMERGENCY DEPARTMENT Provider Note   CSN: [id number] Arrival date & time: 02/20/21  9169     History  Chief Complaint  Patient presents with   Resp Distress    YUSUF YU is a 61 y.o. male.  61 year old male with prior medical history as detailed below presents for evaluation.  Patient reports onset of acute shortness of breath this morning.  Patient with known history of COPD.  EMS reports that patient was approximately 80% on room air.  Patient was placed on CPAP for transport.  Patient was given 2 g of magnesium and and 25 mg of Solu-Medrol by EMS.  Breathing treatments were also administered by EMS. Patient reports on arrival that he feels moderately improved with EMS treatment.  The history is provided by the patient, the EMS personnel and medical records.  Shortness of Breath Severity:  Severe Onset quality:  Sudden Duration:  2 hours Timing:  Constant Progression:  Worsening Chronicity:  New Relieved by:  Nothing Worsened by:  Nothing     Home Medications Prior to Admission medications   Medication Sig Start Date End Date Taking? Authorizing Provider  albuterol (PROVENTIL) (2.5 MG/3ML) 0.083% nebulizer solution TAKE 3 MLS (2.5 MG TOTAL) BY NEBULIZATION IN THE MORNING, AT NOON, AND AT BEDTIME. Patient taking differently: Take 2.5 mg by nebulization in the morning, at noon, and at bedtime. 12/22/20 12/22/21  [name], MD  albuterol (VENTOLIN HFA) 108 (90 Base) MCG/ACT inhaler Inhale 2 puffs into the lungs every 4 (four) hours as needed. Patient taking differently: Inhale 2 puffs into the lungs every 4 (four) hours as needed for wheezing. 12/22/20   [name], MD  benzonatate (TESSALON) 200 MG capsule Take 1 capsule (200 mg total) by mouth 3 (three) times daily as needed for cough. 02/04/21   Thurnell Lose, MD  cefdinir (OMNICEF) 300 MG capsule Take 1 capsule (300 mg total) by mouth 2 (two) times daily. 02/13/21    [name], MD  Fluticasone-Umeclidin-Vilant (TRELEGY ELLIPTA) 100-62.5-25 MCG/ACT AEPB Inhale 1 puff into the lungs daily. 12/22/20   [name], MD  furosemide (LASIX) 20 MG tablet Take 1 tablet (20 mg total) by mouth daily as needed for edema. 12/22/20 12/22/21  [name], MD  guaiFENesin (MUCINEX) 600 MG 12 hr tablet Take 1 tablet (600 mg total) by mouth 2 (two) times daily as needed for cough or to loosen phlegm. 06/29/20 06/29/21  [name], MD  ibuprofen (ADVIL) 600 MG tablet Take 600 mg by mouth every 6 (six) hours as needed for moderate pain.    [provider]  losartan (COZAAR) 50 MG tablet Take 1 tablet (50 mg total) by mouth daily. 12/22/20   [name], MD  metoprolol succinate (TOPROL-XL) 25 MG 24 hr tablet Take 1 tablet (25 mg total) by mouth daily. 12/22/20 12/22/21  [name], MD  pantoprazole (PROTONIX) 40 MG tablet Take 1 tablet (40 mg total) by mouth daily. 12/22/20   [name], MD  predniSONE (DELTASONE) 5 MG tablet Take 2 tablets (10 mg total) by mouth daily with breakfast. Take 8 tabs by mouth daily for 3 days, 6 tabs daily for 3 days, 4 tabs daily for 3 days, then go back to your 10 mg daily dose which you take at home and do not stop it till you see your pulmonary physician 02/12/21   [name], MD  pregabalin (LYRICA) 75 MG capsule Take 1  capsule (75 mg total) by mouth 2 (two) times daily. 12/22/20   [name], MD  Respiratory Therapy Supplies (FLUTTER) DEVI Use 4 times daily 04/27/18   [name], MD      Allergies    Gabapentin and Metformin and related    Review of Systems   Review of Systems  Respiratory:  Positive for shortness of breath.   All other systems reviewed and are negative.  Physical Exam Updated Vital Signs BP (!) 125/91    Pulse (!) 38    Resp (!) 26    SpO2 100%  Physical Exam Vitals and nursing note reviewed.  Constitutional:      General: He is not in acute distress.     Appearance: He is well-developed.     Comments: Is alert, he is in acute respiratory distress, CPAP in place  HENT:     Head: Normocephalic and atraumatic.  Eyes:     Conjunctiva/sclera: Conjunctivae normal.     Pupils: Pupils are equal, round, and reactive to light.  Cardiovascular:     Rate and Rhythm: Regular rhythm. Tachycardia present.     Heart sounds: Normal heart sounds.  Pulmonary:     Effort: Respiratory distress present.     Breath sounds: Wheezing present.  Abdominal:     General: There is no distension.     Palpations: Abdomen is soft.     Tenderness: There is no abdominal tenderness.  Musculoskeletal:        General: No deformity. Normal range of motion.     Cervical back: Normal range of motion and neck supple.  Skin:    General: Skin is warm and dry.  Neurological:     General: No focal deficit present.     Mental Status: He is alert and oriented to person, place, and time.    ED Results / Procedures / Treatments   Labs (all labs ordered are listed, but only abnormal results are displayed) Labs Reviewed  RESP PANEL BY RT-PCR (FLU A&B, COVID) ARPGX2 - Abnormal; Notable for the following components:      Result Value   SARS Coronavirus 2 by RT PCR POSITIVE (*)    All other components within normal limits  CBC WITH DIFFERENTIAL/PLATELET - Abnormal; Notable for the following components:   Neutro Abs 7.8 (*)    All other components within normal limits  LACTIC ACID, PLASMA - Abnormal; Notable for the following components:   Lactic Acid, Venous 2.3 (*)    All other components within normal limits  COMPREHENSIVE METABOLIC PANEL - Abnormal; Notable for the following components:   Glucose, Bld 209 (*)    Total Protein 6.3 (*)    All other components within normal limits  I-STAT ARTERIAL BLOOD GAS, ED - Abnormal; Notable for the following components:   pH, Arterial 7.332 (*)    pCO2 arterial 50.2 (*)    All other components within normal limits  I-STAT CHEM 8,  ED - Abnormal; Notable for the following components:   Glucose, Bld 200 (*)    All other components within normal limits  TROPONIN I (HIGH SENSITIVITY) - Abnormal; Notable for the following components:   Troponin I (High Sensitivity) 26 (*)    All other components within normal limits  CULTURE, BLOOD (ROUTINE X 2)  CULTURE, BLOOD (ROUTINE X 2)  MRSA NEXT GEN BY PCR, NASAL  ETHANOL  PROTIME-INR  LACTIC ACID, PLASMA  URINALYSIS, ROUTINE W REFLEX MICROSCOPIC  TROPONIN I (HIGH SENSITIVITY)  EKG EKG Interpretation  Date/Time:  Friday February 20 2021 09:44:40 EST Ventricular Rate:  142 PR Interval:  73 QRS Duration: 90 QT Interval:  285 QTC Calculation: 438 R Axis:   -20 Text Interpretation: Sinus tachycardia Confirmed by Dene Gentry [id number]) on 02/20/2021 9:51:29 AM  Radiology DG Chest Port 1 View  Result Date: 02/20/2021 CLINICAL DATA:  Shortness of breath. EXAM: PORTABLE CHEST 1 VIEW COMPARISON:  February 01, 2021. FINDINGS: The heart size and mediastinal contours are within normal limits. Multiple old left rib fractures are noted. Right lung is clear. Large left lower lobe airspace opacity is noted consistent with pneumonia. IMPRESSION: Large left lower lobe airspace opacity consistent with pneumonia. Followup PA and lateral chest X-ray is recommended in 3-4 weeks following trial of antibiotic therapy to ensure resolution and exclude underlying malignancy. Electronically Signed   By: Marijo Conception M.D.   On: 02/20/2021 08:53    Procedures Procedures    Medications Ordered in ED Medications  vancomycin (VANCOREADY) IVPB 1750 mg/350 mL (1,750 mg Intravenous New Bag/Given 02/20/21 0943)  ceFEPIme (MAXIPIME) 2 g in sodium chloride 0.9 % 100 mL IVPB (has no administration in time range)  vancomycin (VANCOREADY) IVPB 750 mg/150 mL (has no administration in time range)  LORazepam (ATIVAN) injection 0.5 mg (0.5 mg Intravenous Given 02/20/21 0900)  sodium chloride 0.9 % bolus 500  mL (0 mLs Intravenous Stopped 02/20/21 0943)  ceFEPIme (MAXIPIME) 2 g in sodium chloride 0.9 % 100 mL IVPB (0 g Intravenous Stopped 02/20/21 0943)  sodium chloride 0.9 % bolus 1,000 mL (1,000 mLs Intravenous New Bag/Given 02/20/21 1015)    ED Course/ Medical Decision Making/ A&P                           Medical Decision Making Amount and/or Complexity of Data Reviewed Labs: ordered. Radiology: ordered.  Risk Prescription drug management. Decision regarding hospitalization.    Medical Screen Complete  This patient presented to the ED with complaint of respiratory distress.  This complaint involves an extensive number of treatment options. The initial differential diagnosis includes, but is not limited to, COPD exacerbation, pneumonia, metabolic abnormality, ACS, etc  This presentation is: Acute, Chronic, Self-Limited, Previously Undiagnosed, Uncertain Prognosis, Complicated, Systemic Symptoms, and Threat to Life/Bodily Function  Patient is presenting with acute shortness of breath and wheezing.  Patient is requiring CPAP and then BiPAP for respiratory distress.  Work-up reveals evidence of left-sided pulmonary infiltrates.  Patient will require admission for further work-up and treatment.  Broad-spectrum antibiotics initiated here in the ED.  Hospitalist service is aware of case and will evaluate for admission.    Co morbidities that complicated the patient's evaluation  COPD   Additional history obtained:  Additional history obtained from EMS External records from outside sources obtained and reviewed including prior ED visits and prior Inpatient records.    Lab Tests:  I ordered and personally interpreted labs.  The pertinent results include:  CBC CMP ABG COVID FLU   Imaging Studies ordered:  I ordered imaging studies including chest x-ray I independently visualized and interpreted obtained imaging which showed left lower lobe infiltrate I agree with the  radiologist interpretation.   Cardiac Monitoring:  The patient was maintained on a cardiac monitor.  I personally viewed and interpreted the cardiac monitor which showed an underlying rhythm of: Sinus tach   Medicines ordered:  I ordered medication including broad-spectrum antibiotics for pneumonia Reevaluation of the patient after these  medicines showed that the patient: improved   Problem List / ED Course:  Shortness of breath, COPD exacerbation, pneumonia   Reevaluation:  After the interventions noted above, I reevaluated the patient and found that they have: improved  Disposition:  After consideration of the diagnostic results and the patients response to treatment, I feel that the patent would benefit from admission.    CRITICAL CARE Performed by: Valarie Merino   Total critical care time: 45 minutes  Critical care time was exclusive of separately billable procedures and treating other patients.  Critical care was necessary to treat or prevent imminent or life-threatening deterioration.  Critical care was time spent personally by me on the following activities: development of treatment plan with patient and/or surrogate as well as nursing, discussions with consultants, evaluation of patient's response to treatment, examination of patient, obtaining history from patient or surrogate, ordering and performing treatments and interventions, ordering and review of laboratory studies, ordering and review of radiographic studies, pulse oximetry and re-evaluation of patient's condition.         Final Clinical Impression(s) / ED Diagnoses Final diagnoses:  Dyspnea, unspecified type    Rx / DC Orders ED Discharge Orders     None         Valarie Merino, MD 02/20/21 1109

## 2021-02-20 NOTE — Assessment & Plan Note (Signed)
-  He has history of O2-dependent COPD but had increased O2 requirement overnight, transitioned by EMS to CPAP and on BIPAP at the time of my evaluation -He has LLL PNA but there may also be a COPD component -Nebulizers: prn albuterol -Solu-Medrol 60 mg IV BID (takes 5 mg prednisone daily, likely needs burst or taper) -Continue Trelegy (formular change to Incruse and Breo) -Coordinated care with [facility] team/PT/OT/Nutrition/RT consults

## 2021-02-21 DIAGNOSIS — J189 Pneumonia, unspecified organism: Secondary | ICD-10-CM | POA: Diagnosis not present

## 2021-02-21 LAB — CBC
HCT: 39.2 % (ref 39.0–52.0)
Hemoglobin: 12.6 g/dL — ABNORMAL LOW (ref 13.0–17.0)
MCH: 30.3 pg (ref 26.0–34.0)
MCHC: 32.1 g/dL (ref 30.0–36.0)
MCV: 94.2 fL (ref 80.0–100.0)
Platelets: 213 10*3/uL (ref 150–400)
RBC: 4.16 MIL/uL — ABNORMAL LOW (ref 4.22–5.81)
RDW: 13.5 % (ref 11.5–15.5)
WBC: 17.2 10*3/uL — ABNORMAL HIGH (ref 4.0–10.5)
nRBC: 0 % (ref 0.0–0.2)

## 2021-02-21 LAB — BASIC METABOLIC PANEL
Anion gap: 8 (ref 5–15)
BUN: 15 mg/dL (ref 6–20)
CO2: 26 mmol/L (ref 22–32)
Calcium: 8.8 mg/dL — ABNORMAL LOW (ref 8.9–10.3)
Chloride: 106 mmol/L (ref 98–111)
Creatinine, Ser: 0.77 mg/dL (ref 0.61–1.24)
GFR, Estimated: >60 mL/min (ref >60)
Glucose, Bld: 189 mg/dL — ABNORMAL HIGH (ref 70–99)
Potassium: 4.4 mmol/L (ref 3.5–5.1)
Sodium: 140 mmol/L (ref 135–145)

## 2021-02-21 LAB — GLUCOSE, CAPILLARY
Glucose-Capillary: 150 mg/dL — ABNORMAL HIGH (ref 70–99)
Glucose-Capillary: 141 mg/dL — ABNORMAL HIGH (ref 70–99)
Glucose-Capillary: 147 mg/dL — ABNORMAL HIGH (ref 70–99)
Glucose-Capillary: 204 mg/dL — ABNORMAL HIGH (ref 70–99)
Glucose-Capillary: 148 mg/dL — ABNORMAL HIGH (ref 70–99)

## 2021-02-21 MED ORDER — NICOTINE 21 MG/24HR TD PT24
21.0000 mg | MEDICATED_PATCH | Freq: Every day | TRANSDERMAL | Status: DC
  Administered 2021-02-22 (×2): 21 mg via TRANSDERMAL
  Filled 2021-02-21 (×2): qty 1

## 2021-02-21 MED ORDER — VANCOMYCIN HCL 1500 MG/300ML IV SOLN: 1500.0000 mg | Freq: Two times a day (BID) | INTRAVENOUS | Status: DC

## 2021-02-21 MED ORDER — NICOTINE 21 MG/24HR TD PT24: 21.0000 mg | MEDICATED_PATCH | Freq: Every day | TRANSDERMAL | Status: DC

## 2021-02-21 MED ORDER — METHYLPREDNISOLONE SODIUM SUCC 125 MG IJ SOLR
80.0000 mg | Freq: Every day | INTRAMUSCULAR | Status: DC
  Administered 2021-02-21 – 2021-02-22 (×2): 80 mg via INTRAVENOUS
  Filled 2021-02-21 (×2): qty 2

## 2021-02-21 NOTE — Evaluation (Signed)
Physical Therapy Evaluation Patient Details Name: James [name] MRN: [id number] DOB: 23-Mar-1960 Today's Date: 02/21/2021  History of Present Illness  62 yo male with onset of SOB and recent admission for Covid and COPD exacerbation was admitted to hosp for evaluation and tx on 2/17. Pt has sepsis PNA, desaturating and tachycardic, leukocytosis, elevated lactic acid.   PMHx:  COPD, Covid, DM, HTN, EtOH dependence,  Clinical Impression  Pt is demonstrating a limit of balance that does not involve actual LOB but is challenged, although LE strength is good.  He maintained O2 sat on room air for the short trip in his room, and had O2 off his nose when PT arrived.  Follow along with him to check on stairs when able, but currently is having some urgency of GI symptoms with mobility.  Will likely need O2 for the longer walks since use of O2 was in place at baseline.  Follow acute PT goals as outlined below.  [place] O2 sat 96% and post gait sats room air 91%, recovered to 96% at rest.     Recommendations for follow up therapy are one component of a multi-disciplinary discharge planning process, led by the attending physician.  Recommendations may be updated based on patient status, additional functional criteria and insurance authorization.  Follow Up Recommendations Home health PT    Assistance Recommended at Discharge Intermittent Supervision/Assistance  Patient can return home with the following  A little help with walking and/or transfers;A little help with bathing/dressing/bathroom;Assistance with cooking/housework;Help with stairs or ramp for entrance;Assist for transportation    Equipment Recommendations None recommended by PT  Recommendations for Other Services       Functional Status Assessment Patient has had a recent decline in their functional status and demonstrates the ability to make significant improvements in function in a reasonable and predictable amount of time.      Precautions / Restrictions Precautions Precautions: Fall Precaution Comments: may be baseline Restrictions Weight Bearing Restrictions: No      Mobility  Bed Mobility Overal bed mobility: Modified Independent             General bed mobility comments: extra time and using bedrail    Transfers Overall transfer level: Needs assistance Equipment used: Rolling walker (2 wheels) Transfers: Sit to/from Stand Sit to Stand: Supervision           General transfer comment: pt was attempted with walker but finally discarded    Ambulation/Gait Ambulation/Gait assistance: Min guard (for safety) Gait Distance (Feet): 35 Feet Assistive device: Rolling walker (2 wheels), None Gait Pattern/deviations: Step-through pattern, Decreased stride length, Shuffle Gait velocity: reduced Gait velocity interpretation: <1.31 ft/sec, indicative of household ambulator Pre-gait activities: standing balance ck General Gait Details: pt is walking faster but reminders given about his lines being a trip hazard.  Stairs            Wheelchair Mobility    Modified Rankin (Stroke Patients Only)       Balance Overall balance assessment: Mild deficits observed, not formally tested                                           Pertinent Vitals/Pain Pain Assessment Pain Assessment: No/denies pain    Home Living Family/patient expects to be discharged to:: Private residence Living Arrangements: Non-relatives/Friends Available Help at Discharge: Family;Available PRN/intermittently Type of Home: House Home Access: Stairs to  enter Entrance Stairs-Rails: None Entrance Stairs-Number of Steps: 2   Home Layout: One level Home Equipment: None Additional Comments: On 2 L O2 at home    Prior Function Prior Level of Function : Independent/Modified Independent             Mobility Comments: no longer drives but goes out with help to get groceries       Hand Dominance    Dominant Hand: Right    Extremity/Trunk Assessment   Upper Extremity Assessment Upper Extremity Assessment: Overall WFL for tasks assessed    Lower Extremity Assessment Lower Extremity Assessment: Overall WFL for tasks assessed    Cervical / Trunk Assessment Cervical / Trunk Assessment: Normal  Communication   Communication: No difficulties  Cognition Arousal/Alertness: Awake/alert Behavior During Therapy: Impulsive, WFL for tasks assessed/performed Overall Cognitive Status: Within Functional Limits for tasks assessed                                 General Comments: pt is able to review his history        General Comments General comments (skin integrity, edema, etc.): pt is mildly unsteady but is comfortable with this, mainly an issue with his lines.  Pt is likely close to baseline, will need to assess him on steps    Exercises     Assessment/Plan    PT Assessment Patient needs continued PT services  PT Problem List Decreased balance;Decreased coordination       PT Treatment Interventions DME instruction;Gait training;Stair training;Functional mobility training;Therapeutic activities;Therapeutic exercise;Balance training;Neuromuscular re-education;Patient/family education    PT Goals (Current goals can be found in the Care Plan section)  Acute Rehab PT Goals Patient Stated Goal: return home at prior residence PT Goal Formulation: With patient Time For Goal Achievement: 02/28/21 Potential to Achieve Goals: Good    Frequency Min 3X/week     Co-evaluation               AM-PAC PT "6 Clicks" Mobility  Outcome Measure Help needed turning from your back to your side while in a flat bed without using bedrails?: None Help needed moving from lying on your back to sitting on the side of a flat bed without using bedrails?: A Little Help needed moving to and from a bed to a chair (including a wheelchair)?: A Little Help needed standing up from a  chair using your arms (e.g., wheelchair or bedside chair)?: A Little Help needed to walk in hospital room?: A Little Help needed climbing 3-5 steps with a railing? : A Little 6 Click Score: 19    End of Session Equipment Utilized During Treatment: Gait belt Activity Tolerance: Patient tolerated treatment well;Other (comment) (on ROOM AIR) Patient left: in bed;with call bell/phone within reach;with bed alarm set Nurse Communication: Mobility status PT Visit Diagnosis: Unsteadiness on feet (R26.81)    Time: 2248-2500 PT Time Calculation (min) (ACUTE ONLY): 23 min   Charges:   PT Evaluation $PT Eval Moderate Complexity: 1 Mod PT Treatments $Gait Training: 8-22 mins       Ramond Dial 02/21/2021, 1:20 PM  Mee Hives, PT PhD Acute Rehab Dept. Number: [place] and [employer]

## 2021-02-21 NOTE — Progress Notes (Signed)
Pt requesting to come off cpap machine.  VSS, sat 95-97% on 2 lpm [place]

## 2021-02-21 NOTE — Progress Notes (Signed)
Mobility Specialist Progress Note:   02/21/21 1028  Mobility  Activity Ambulated with assistance in room;Ambulated with assistance to bathroom  Level of Assistance Standby assist, set-up cues, supervision of patient - no hands on  Assistive Device None  Distance Ambulated (ft) 40 ft  Activity Response Tolerated well  $Mobility charge 1 Mobility   Pt received trying to go to bathroom. No complaints of pain. Pt able to have BM. Left in bed with call bell in reach and all needs met.   [facility] Public librarian Phone [phone]

## 2021-02-21 NOTE — Progress Notes (Signed)
PROGRESS NOTE    James [name]  WER:[id number] DOB: Oct 15, 1960 DOA: 02/20/2021 PCP: [name], MD   Brief Narrative:  HPI: James [name] is a 61 y.o. male with medical history significant of COPD on 2-4L home O2; ETOH dependence; DM; and HTN presenting with SOB. He was most recently hospitalized from 1/29-2/1 with COPD exacerbation related to COVID-19 infection.  He was treated with O2 and IV steroids.  He reports that he was doing pretty well and felt good yesterday but awoke about 0600 with acute SOB and cough.  He is better now, on BIPAP, and asking for food/drink.   ER Course:  Woke up this AM with SOB.  CPAP, Solumedrol, Mag++, Ativan given with some improvement.  CXR looks worse than prior.  Given Cefepime and Vanc  Assessment & Plan:   Principal Problem:   HCAP (healthcare-associated pneumonia) Active Problems:   COPD mixed type ([place])   Hypertension   Type 2 diabetes mellitus without complication, without long-term current use of insulin (HCC)   Severe sepsis ([place])  Severe sepsis secondary to HCAP (healthcare-associated pneumonia)/chronic hypoxic respiratory failure- (present on admission): Patient meets severe sepsis criteria based on tachypnea, tachycardia and lactic acid of 2.3.  He had no leukocytosis yesterday but now he has leukocytosis as well which could very well be due to Solu-Medrol he received.  Overall he is feeling better today.  Sepsis parameters are resolving.  He does qualify for H CAP.  I doubt that he has MRSA so I will discontinue vancomycin but will continue Rocephin.  Await culture reports, sputum culture.  We will check urine antigen for Legionella and streptococci.  Uses 2 to 4 L of oxygen at home, currently on 2 L which is his baseline.  Continue bronchodilators, Breo Ellipta.   Acute COPD exacerbation: He has some wheezes, appears to be in acute COPD exacerbation.  He received Solu-Medrol yesterday.  We will continue on 40 mg IV twice daily and  continue bronchodilators.   Type 2 diabetes mellitus without complication, without long-term current use of insulin (HCC) -Last A1c was 6.5 -Not taking home meds -Cover with moderate-scale SSI.  Blood sugar controlled.   Hypertension- (present on admission) -Blood pressure controlled.  Continue Toprol, Cozaar, prn IV hydralazine   DVT prophylaxis: enoxaparin (LOVENOX) injection 40 mg Start: 02/20/21 1200   Code Status: Full Code  Family Communication:  None present at bedside.  Plan of care discussed with patient in length and he/she verbalized understanding and agreed with it.  Status is: Inpatient Remains inpatient appropriate because: Needs IV antibiotics and Solu-Medrol   Estimated body mass index is 29.5 kg/m as calculated from the following:   Height as of this encounter: 5\' 9"  (1.753 m).   Weight as of this encounter: 90.6 kg.    Nutritional Assessment: Body mass index is 29.5 kg/m.[name] Seen by dietician.  I agree with the assessment and plan as outlined below: Nutrition Status:        . Skin Assessment: I have examined the patient's skin and I agree with the wound assessment as performed by the wound care RN as outlined below:    Consultants:  None  Procedures:  None  Antimicrobials:  Anti-infectives (From admission, onward)    Start     Dose/Rate Route Frequency Ordered Stop   02/21/21 2200  vancomycin (VANCOREADY) IVPB 1500 mg/300 mL  Status:  Discontinued        1,500 mg 150 mL/hr over 120 Minutes Intravenous Every  12 hours 02/21/21 1014 02/21/21 1030   02/20/21 2200  vancomycin (VANCOREADY) IVPB 750 mg/150 mL  Status:  Discontinued        750 mg 150 mL/hr over 60 Minutes Intravenous Every 12 hours 02/20/21 0959 02/21/21 1014   02/20/21 1400  ceFEPIme (MAXIPIME) 2 g in sodium chloride 0.9 % 100 mL IVPB        2 g 200 mL/hr over 30 Minutes Intravenous Every 8 hours 02/20/21 0959     02/20/21 0900  ceFEPIme (MAXIPIME) 2 g in sodium chloride 0.9 % 100 mL  IVPB        2 g 200 mL/hr over 30 Minutes Intravenous  Once 02/20/21 0850 02/20/21 0943   02/20/21 0900  vancomycin (VANCOREADY) IVPB 1750 mg/350 mL        1,750 mg 175 mL/hr over 120 Minutes Intravenous  Once 02/20/21 0850 02/20/21 1115         Subjective: Seen and examined.  Breathing has improved but not back to baseline.  Objective: Vitals:   02/21/21 0028 02/21/21 0430 02/21/21 0755 02/21/21 0820  BP: 100/65 (!) 113/91  (!) 102/51  Pulse: 80 100 76 94  Resp: 18 17 19 20   Temp: 97.8 F (36.6 C) 98.8 F (37.1 C)  98.6 F (37 C)  TempSrc:  Axillary  Oral  SpO2: 98% 96% 96% 93%  Weight:      Height:        Intake/Output Summary (Last 24 hours) at 02/21/2021 1038 Last data filed at 02/21/2021 0500 Gross per 24 hour  Intake --  Output 1600 ml  Net -1600 ml   Filed Weights   02/20/21 1652  Weight: 90.6 kg    Examination:  General exam: Appears calm and comfortable  Respiratory system: Rhonchi mainly in the left lower and middle lobe, bilateral end expiratory wheezes.[name] Respiratory effort normal. Cardiovascular system: S1 & S2 heard, RRR. No JVD, murmurs, rubs, gallops or clicks. No pedal edema. Gastrointestinal system: Abdomen is nondistended, soft and nontender. No organomegaly or masses felt. Normal bowel sounds heard. Central nervous system: Alert and oriented. No focal neurological deficits. Extremities: Symmetric 5 x 5 power. Skin: No rashes, lesions or ulcers Psychiatry: Judgement and insight appear normal. Mood & affect appropriate.    Data Reviewed: I have personally reviewed following labs and imaging studies  CBC: Recent Labs  Lab 02/20/21 0848 02/20/21 0850 02/20/21 0905 02/21/21 0224  WBC  --  9.1  --  17.2*  NEUTROABS  --  7.8*  --   --   HGB 15.3 15.2 16.7 12.6*  HCT 45.0 47.8 49.0 39.2  MCV  --  96.2  --  94.2  PLT  --  297  --  094   Basic Metabolic Panel: Recent Labs  Lab 02/20/21 0848 02/20/21 0850 02/20/21 0905 02/21/21 0224   NA 139 142 142 140  K 4.1 4.2 4.2 4.4  CL  --  103 103 106  CO2  --  29  --  26  GLUCOSE  --  209* 200* 189*  BUN  --  12 16 15   CREATININE  --  1.17 1.00 0.77  CALCIUM  --  9.0  --  8.8*   GFR: Estimated Creatinine Clearance: 110.7 mL/min (by C-G formula based on SCr of 0.77 mg/dL). Liver Function Tests: Recent Labs  Lab 02/20/21 0850  AST 24  ALT 30  ALKPHOS 98  BILITOT 0.4  PROT 6.3*  ALBUMIN 3.8   No results for input(s):  LIPASE, AMYLASE in the last 168 hours. No results for input(s): AMMONIA in the last 168 hours. Coagulation Profile: Recent Labs  Lab 02/20/21 0850  INR 0.9   Cardiac Enzymes: No results for input(s): CKTOTAL, CKMB, CKMBINDEX, TROPONINI in the last 168 hours. BNP (last 3 results) No results for input(s): PROBNP in the last 8760 hours. HbA1C: No results for input(s): HGBA1C in the last 72 hours. CBG: Recent Labs  Lab 02/20/21 2049 02/21/21 0648 02/21/21 0822  GLUCAP 203* 150* 141*   Lipid Profile: No results for input(s): CHOL, HDL, LDLCALC, TRIG, CHOLHDL, LDLDIRECT in the last 72 hours. Thyroid Function Tests: No results for input(s): TSH, T4TOTAL, FREET4, T3FREE, THYROIDAB in the last 72 hours. Anemia Panel: No results for input(s): VITAMINB12, FOLATE, FERRITIN, TIBC, IRON, RETICCTPCT in the last 72 hours. Sepsis Labs: Recent Labs  Lab 02/20/21 0850  LATICACIDVEN 2.3*    Recent Results (from the past 240 hour(s))  Resp Panel by RT-PCR (Flu A&B, Covid)     Status: Abnormal   Collection Time: 02/20/21  8:41 AM   Specimen: Nasopharyngeal(NP) swabs in vial transport medium  Result Value Ref Range Status   SARS Coronavirus 2 by RT PCR POSITIVE (A) NEGATIVE Final    Comment: (NOTE) SARS-CoV-2 target nucleic acids are DETECTED.  The SARS-CoV-2 RNA is generally detectable in upper respiratory specimens during the acute phase of infection. Positive results are indicative of the presence of the identified virus, but do not rule out  bacterial infection or co-infection with other pathogens not detected by the test. Clinical correlation with patient history and other diagnostic information is necessary to determine patient infection status. The expected result is Negative.  Fact Sheet for Patients: EntrepreneurPulse.com.au  Fact Sheet for Healthcare Providers: IncredibleEmployment.be  This test is not yet approved or cleared by the [place] FDA and  has been authorized for detection and/or diagnosis of SARS-CoV-2 by FDA under an Emergency Use Authorization (EUA).  This EUA will remain in effect (meaning this test can be used) for the duration of  the COVID-19 declaration under Section 564(b)(1) of the A ct, 21 U.S.C. section 360bbb-3(b)(1), unless the authorization is terminated or revoked sooner.     Influenza A by PCR NEGATIVE NEGATIVE Final   Influenza B by PCR NEGATIVE NEGATIVE Final    Comment: (NOTE) The Xpert Xpress SARS-CoV-2/FLU/RSV plus assay is intended as an aid in the diagnosis of influenza from Nasopharyngeal swab specimens and should not be used as a sole basis for treatment. Nasal washings and aspirates are unacceptable for Xpert Xpress SARS-CoV-2/FLU/RSV testing.  Fact Sheet for Patients: EntrepreneurPulse.com.au  Fact Sheet for Healthcare Providers: IncredibleEmployment.be  This test is not yet approved or cleared by the [place] FDA and has been authorized for detection and/or diagnosis of SARS-CoV-2 by FDA under an Emergency Use Authorization (EUA). This EUA will remain in effect (meaning this test can be used) for the duration of the COVID-19 declaration under Section 564(b)(1) of the Act, 21 U.S.C. section 360bbb-3(b)(1), unless the authorization is terminated or revoked.  Performed at [facility], [place] [address] [place], [place] [address]   Culture, blood (routine x 2)     Status: None  (Preliminary result)   Collection Time: 02/20/21  8:55 AM   Specimen: BLOOD RIGHT ARM  Result Value Ref Range Status   Specimen Description BLOOD RIGHT ARM  Final   Special Requests   Final    BOTTLES DRAWN AEROBIC AND ANAEROBIC Blood Culture adequate volume  Culture   Final    NO GROWTH < 24 HOURS Performed at [facility], [place] [address] [place], [place] [address]    Report Status PENDING  Incomplete  Culture, blood (routine x 2)     Status: None (Preliminary result)   Collection Time: 02/20/21  9:00 AM   Specimen: BLOOD  Result Value Ref Range Status   Specimen Description BLOOD SITE NOT SPECIFIED  Final   Special Requests   Final    BOTTLES DRAWN AEROBIC AND ANAEROBIC Blood Culture adequate volume   Culture   Final    NO GROWTH < 24 HOURS Performed at [facility], [place] [address], [place], [place] [address]    Report Status PENDING  Incomplete     Radiology Studies: DG Chest Port 1 View  Result Date: 02/20/2021 CLINICAL DATA:  Shortness of breath. EXAM: PORTABLE CHEST 1 VIEW COMPARISON:  February 01, 2021. FINDINGS: The heart size and mediastinal contours are within normal limits. Multiple old left rib fractures are noted. Right lung is clear. Large left lower lobe airspace opacity is noted consistent with pneumonia. IMPRESSION: Large left lower lobe airspace opacity consistent with pneumonia. Followup PA and lateral chest X-ray is recommended in 3-4 weeks following trial of antibiotic therapy to ensure resolution and exclude underlying malignancy. Electronically Signed   By: Marijo Conception M.D.   On: 02/20/2021 08:53    Scheduled Meds:  docusate sodium  100 mg Oral BID   enoxaparin (LOVENOX) injection  40 mg Subcutaneous Q24H   fluticasone furoate-vilanterol  1 puff Inhalation Daily   And   umeclidinium bromide  1 puff Inhalation Daily   insulin aspart  0-15 Units Subcutaneous TID WC   losartan  50 mg Oral Daily   methylPREDNISolone (SOLU-MEDROL) injection   80 mg Intravenous Daily   metoprolol succinate  25 mg Oral Daily   pantoprazole  40 mg Oral Daily   pregabalin  75 mg Oral Daily   sodium chloride flush  3 mL Intravenous Q12H   Continuous Infusions:  ceFEPime (MAXIPIME) IV 2 g (02/21/21 7824)     LOS: 1 day   Time spent: 32-minute  [name], MD [facility]  02/21/2021, 10:38 AM  Please page via [facility] and do not message via secure chat for urgent patient care matters. Secure chat can be used for non urgent patient care matters.  How to contact the [facility] Attending or Consulting provider [place] or covering provider during after hours [place], for this patient?  Check the care team in [facility] and look for a) attending/consulting TRH provider listed and b) the [facility] team listed. Page or secure chat [phone]. Log into [email or web address] and use [place]'s universal password to access. If you do not have the password, please contact the hospital operator. Locate the [facility] provider you are looking for under [facility] and page to a number that you can be directly reached. If you still have difficulty reaching the provider, please page the [facility] (Director on Call) for the Hospitalists listed on amion for assistance.

## 2021-02-21 NOTE — Progress Notes (Addendum)
Pharmacy Antibiotic Note  James [name] is a 61 y.o. male admitted on 02/20/2021 presenting with SOB on BiPAP, concern for sepsis. Patient hospitalized 1/29-2/1 for COPD exacerbation d/t COVID-19 infection.  Pharmacy has been consulted for vancomycin and cefepime dosing.  Renal function improved 2/18 from 1.17>>0.77, requiring Vancomycin dose adjustment. Patient remains afebrile, WBC 17.2 (of note, patient on methylpred).   Plan: INCREASE Vancomycin to 1500 mg IV q12H (eAUC 529, Scr used 0.8)  CONTINUE Cefepime 2g IV q 8 hours Monitor renal function, Cx/ MRSA PCR to narrow Vancomycin levels as indicated  ADDENDUM: Vancomycin stopped by MD  CONTINUE Cefepime 2g IV Q8H  Monitor renal function, C/S   Height: 5\' 9"  (175.3 cm) Weight: 90.6 kg (199 lb 11.8 oz) IBW/kg (Calculated) : 70.7  Temp (24hrs), Avg:98.2 F (36.8 C), Min:96.6 F (35.9 C), Max:99.2 F (37.3 C)  Recent Labs  Lab 02/20/21 0850 02/20/21 0905 02/21/21 0224  WBC 9.1  --  17.2*  CREATININE 1.17 1.00 0.77  LATICACIDVEN 2.3*  --   --      Estimated Creatinine Clearance: 110.7 mL/min (by C-G formula based on SCr of 0.77 mg/dL).    Allergies  Allergen Reactions   Gabapentin Other (See Comments)    hallucinations   Metformin And Related Nausea Only    Pt states "it doesn't make him feel good"    Dose adjustments this admission:  Vancomycin 750 mg IV Q12H>>1500 mg Q12H (2/18)   Microbiology results:  COVID + w/ CT 33 (hospitalized 1/29-2/1) 2/17 MRSA PCR: sent  2/17 Bcx: ngtd   Antimicrobials this admission:  Cefepime 2/17>> Vancomycin 2/17>>2/18   Adria Dill, PharmD PGY-1 Acute Care Resident  02/21/2021 10:12 AM

## 2021-02-22 DIAGNOSIS — J189 Pneumonia, unspecified organism: Secondary | ICD-10-CM | POA: Diagnosis not present

## 2021-02-22 LAB — CBC WITH DIFFERENTIAL/PLATELET
Abs Immature Granulocytes: 0.15 10*3/uL — ABNORMAL HIGH (ref 0.00–0.07)
Basophils Absolute: 0 10*3/uL (ref 0.0–0.1)
Basophils Relative: 0 %
Eosinophils Absolute: 0 10*3/uL (ref 0.0–0.5)
Eosinophils Relative: 0 %
HCT: 35.3 % — ABNORMAL LOW (ref 39.0–52.0)
Hemoglobin: 11 g/dL — ABNORMAL LOW (ref 13.0–17.0)
Immature Granulocytes: 1 %
Lymphocytes Relative: 4 %
Lymphs Abs: 0.6 10*3/uL — ABNORMAL LOW (ref 0.7–4.0)
MCH: 29.9 pg (ref 26.0–34.0)
MCHC: 31.2 g/dL (ref 30.0–36.0)
MCV: 95.9 fL (ref 80.0–100.0)
Monocytes Absolute: 0.8 10*3/uL (ref 0.1–1.0)
Monocytes Relative: 5 %
Neutro Abs: 14 10*3/uL — ABNORMAL HIGH (ref 1.7–7.7)
Neutrophils Relative %: 90 %
Platelets: 202 10*3/uL (ref 150–400)
RBC: 3.68 MIL/uL — ABNORMAL LOW (ref 4.22–5.81)
RDW: 13.7 % (ref 11.5–15.5)
WBC: 15.5 10*3/uL — ABNORMAL HIGH (ref 4.0–10.5)
nRBC: 0 % (ref 0.0–0.2)

## 2021-02-22 LAB — BASIC METABOLIC PANEL
Anion gap: 8 (ref 5–15)
BUN: 23 mg/dL — ABNORMAL HIGH (ref 6–20)
CO2: 23 mmol/L (ref 22–32)
Calcium: 8.6 mg/dL — ABNORMAL LOW (ref 8.9–10.3)
Chloride: 106 mmol/L (ref 98–111)
Creatinine, Ser: 0.88 mg/dL (ref 0.61–1.24)
GFR, Estimated: >60 mL/min (ref >60)
Glucose, Bld: 209 mg/dL — ABNORMAL HIGH (ref 70–99)
Potassium: 4.3 mmol/L (ref 3.5–5.1)
Sodium: 137 mmol/L (ref 135–145)

## 2021-02-22 LAB — GLUCOSE, CAPILLARY
Glucose-Capillary: 133 mg/dL — ABNORMAL HIGH (ref 70–99)
Glucose-Capillary: 242 mg/dL — ABNORMAL HIGH (ref 70–99)

## 2021-02-22 MED ORDER — LEVOFLOXACIN 750 MG PO TABS
750.0000 mg | ORAL_TABLET | Freq: Every day | ORAL | 0 refills | Status: AC
  Filled 2021-02-22 – 2021-08-06 (×2): qty 5, 5d supply, fill #0

## 2021-02-22 MED ORDER — PREDNISONE 50 MG PO TABS
50.0000 mg | ORAL_TABLET | Freq: Every day | ORAL | 0 refills | Status: AC
  Filled 2021-02-22 – 2021-06-29 (×4): qty 3, 3d supply, fill #0

## 2021-02-22 NOTE — TOC Transition Note (Signed)
Transition of Care [facility]) - CM/SW Discharge Note   Patient Details  Name: James [name] MRN: [id number] Date of Birth: 16-Feb-1960  Transition of Care [facility]) CM/SW Contact:  Bartholomew Crews, RN Phone Number: [phone] 02/22/2021, 1:08 PM   Clinical Narrative:     Patient to transition home today. Noted recommendations for [facility] PT. Unable to find accepting [facility] agency. Spoke with patient at the bedside. He is agreeable to transition home without [place]. Patient requesting transportation home. Attempted to arrange transportation through [place], however, patient has not been set up. Cab voucher provided. No further TOC  needs identified.   Final next level of care: Home/Self Care Barriers to Discharge: No Barriers Identified   Patient Goals and CMS Choice Patient states their goals for this hospitalization and ongoing recovery are:: return home CMS Medicare.gov Compare Post Acute Care list provided to:: Patient Choice offered to / list presented to : NA  Discharge Placement                       Discharge Plan and Services                DME Arranged: N/A DME Agency: NA       HH Arranged: NA HH Agency: NA        Social Determinants of Health (SDOH) Interventions     Readmission Risk Interventions Readmission Risk Prevention Plan 02/04/2021 08/14/2020 04/24/2020  Post Dischage Appt Complete - -  Medication Screening Complete - -  Transportation Screening Complete Complete Complete  PCP or Specialist Appt within 3-5 Days - Complete -  Not Complete comments - patient already has an appointment schedules with [name] -  [place] or [place] for [place] Planning/Counseling - Complete -  Palliative Care Screening - Not Applicable -  Medication Review (RN Care Manager) - Complete -  PCP or Specialist appointment within 3-5 days of discharge - - Not Complete  PCP/Specialist Appt Not Complete comments - - Pt had existing appt  5/23--this is first available per [facility]  [place] or [place] - - Complete  SW Recovery Care/Counseling Consult - - Complete  [place] - - Not Applicable  Some recent data might be hidden

## 2021-02-22 NOTE — Evaluation (Signed)
Occupational Therapy Evaluation Patient Details Name: James [name] MRN: [id number] DOB: 1960-01-18 Today's Date: 02/22/2021   History of Present Illness 61 yo male with onset of SOB and recent admission for Covid and COPD exacerbation was admitted to hosp for evaluation and tx on 2/17. Pt has sepsis PNA, desaturating and tachycardic, leukocytosis, elevated lactic acid.   PMHx:  COPD, Covid, DM, HTN, EtOH dependence,   Clinical Impression   Pt admitted with the above diagnosis and demonstrates the below listed deficits.  He currently is able to complete ADLs with supervision.  He demonstrates mild balance deficits, and decreased activity tolerance.  He reports he has a friend or family member that stays with him and can assist him if necessary.  He reports he was mod I with ADLs PTA.  Sp02 >93% on RA with activity this date.       Recommendations for follow up therapy are one component of a multi-disciplinary discharge planning process, led by the attending physician.  Recommendations may be updated based on patient status, additional functional criteria and insurance authorization.   Follow Up Recommendations  No OT follow up    Assistance Recommended at Discharge Intermittent Supervision/Assistance  Patient can return home with the following A little help with walking and/or transfers;A little help with bathing/dressing/bathroom;Assistance with cooking/housework;Assist for transportation;Help with stairs or ramp for entrance    Functional Status Assessment  Patient has had a recent decline in their functional status and demonstrates the ability to make significant improvements in function in a reasonable and predictable amount of time.  Equipment Recommendations  None recommended by OT    Recommendations for Other Services       Precautions / Restrictions Precautions Precautions: Fall Precaution Comments: may be baseline Restrictions Weight Bearing Restrictions: No       Mobility Bed Mobility Overal bed mobility: Modified Independent             General bed mobility comments: extra time and using bedrail    Transfers                          Balance Overall balance assessment: Mild deficits observed, not formally tested                                         ADL either performed or assessed with clinical judgement   ADL Overall ADL's : Needs assistance/impaired Eating/Feeding: Independent   Grooming: Wash/dry hands;Wash/dry face;Oral care;Brushing hair;Supervision/safety;Standing   Upper Body Bathing: Set up;Sitting   Lower Body Bathing: Supervison/ safety;Sit to/from stand   Upper Body Dressing : Set up;Sitting   Lower Body Dressing: Supervision/safety;Sit to/from stand   Toilet Transfer: Supervision/safety;Ambulation;Comfort height toilet   Toileting- Clothing Manipulation and Hygiene: Supervision/safety;Sit to/from stand       Functional mobility during ADLs: Supervision/safety       Vision Patient Visual Report: No change from baseline       Perception     Praxis      Pertinent Vitals/Pain Pain Assessment Pain Assessment: No/denies pain     Hand Dominance Right   Extremity/Trunk Assessment Upper Extremity Assessment Upper Extremity Assessment: Generalized weakness   Lower Extremity Assessment Lower Extremity Assessment: Generalized weakness   Cervical / Trunk Assessment Cervical / Trunk Assessment: Normal   Communication Communication Communication: No difficulties   Cognition Arousal/Alertness: Awake/alert Behavior During Therapy: Impulsive,  WFL for tasks assessed/performed Overall Cognitive Status: Within Functional Limits for tasks assessed                                       General Comments  Sp02 93-100% on RA with activity    Exercises     Shoulder Instructions      Home Living Family/patient expects to be discharged to:: Private  residence Living Arrangements: Non-relatives/Friends Available Help at Discharge: Family;Available PRN/intermittently Type of Home: House Home Access: Stairs to enter [employer] of Steps: 2 Entrance Stairs-Rails: None Home Layout: One level     Bathroom Shower/Tub: Teacher, early years/pre: Standard Bathroom Accessibility: Yes   Home Equipment: None   Additional Comments: On 2 L O2 at home      Prior Functioning/Environment Prior Level of Function : Independent/Modified Independent             Mobility Comments: no longer drives but goes out with help to get groceries ADLs Comments: Pt reports he tub bathes, but has had a couple of falls getting out of tub, but has placed a mat in bottom of tub which has reduced falls.  he reports he is able to grocery shop mod I.        OT Problem List: Impaired balance (sitting and/or standing);Cardiopulmonary status limiting activity      OT Treatment/Interventions:      OT Goals(Current goals can be found in the care plan section) Acute Rehab OT Goals Patient Stated Goal: to go home today OT Goal Formulation: All assessment and education complete, DC therapy  OT Frequency:      Co-evaluation              AM-PAC OT "6 Clicks" Daily Activity     Outcome Measure Help from another person eating meals?: None Help from another person taking care of personal grooming?: A Little Help from another person toileting, which includes using toliet, bedpan, or urinal?: A Little Help from another person bathing (including washing, rinsing, drying)?: A Little Help from another person to put on and taking off regular upper body clothing?: A Little Help from another person to put on and taking off regular lower body clothing?: A Little 6 Click Score: 19   End of Session Nurse Communication: Mobility status  Activity Tolerance: Patient tolerated treatment well Patient left: in bed;with bed alarm set (sitting  EOB)  OT Visit Diagnosis: Unsteadiness on feet (R26.81)                Time: 8338-2505 OT Time Calculation (min): 23 min Charges:  OT General Charges $OT Visit: 1 Visit OT Evaluation $OT Eval Moderate Complexity: 1 Mod OT Treatments $Therapeutic Activity: 8-22 mins  Nilsa Nutting., OTR/L Acute Rehabilitation Services Pager [phone] Office [phone]   Lucille Passy M 02/22/2021, 11:30 AM

## 2021-02-22 NOTE — Discharge Summary (Signed)
[facility] Discharge Summary  James [name] JOI:[id number] DOB: 05-Dec-1960 DOA: 02/20/2021  PCP: [name], MD  Admit date: 02/20/2021 Discharge date: 02/22/2021 30 Day Unplanned Readmission Risk Score    Flowsheet Row ED to Hosp-Admission (Current) from 02/20/2021 in [facility]  30 Day Unplanned Readmission Risk Score (%) 29.18 Filed at 02/22/2021 0801       This score is the patient's risk of an unplanned readmission within 30 days of being discharged (0 -100%). The score is based on dignosis, age, lab data, medications, orders, and past utilization.   Low:  0-14.9   Medium: 15-21.9   High: 22-29.9   Extreme: 30 and above          Admitted From: Home Disposition: Home  Recommendations for Outpatient Follow-up:  Follow up with PCP in 1-2 weeks Please obtain BMP/CBC in one week Please follow up with your PCP on the following pending results: Unresulted Labs (From admission, onward)     Start     Ordered   02/20/21 1151  Expectorated Sputum Assessment w Gram Stain, Rflx to Resp Cult  (COPD / Pneumonia / Cellulitis / Lower Extremity Wound)  Once,   R        02/20/21 1157   02/20/21 0851  MRSA Next Gen by PCR, Nasal  Once,   STAT        02/20/21 0850   02/20/21 0840  Urinalysis, Routine w reflex microscopic  Once,   STAT        02/20/21 0840              Home Health: Yes Equipment/Devices: None  Discharge Condition: Stable CODE STATUS: Full code Diet recommendation: Cardiac  Subjective: Seen and examined.  He feels better.  He was saturating 97% on room air during my visit.  He is willing to go home  Brief/Interim Summary: HPI: James [name] is a 61 y.o. male with medical history significant of COPD on 2-4L home O2; ETOH dependence; DM; and HTN presented with SOB. He was most recently hospitalized from 1/29-2/1 with COPD exacerbation related to COVID-19 infection.  He was treated with IV steroids.  He was doing well  until the day of admission when he started having worsening shortness of breath so he came to the ED.  Admitted to hospital service with following issues.   Severe sepsis secondary to HCAP (healthcare-associated pneumonia)/chronic hypoxic respiratory failure- (present on admission): Patient meets severe sepsis criteria based on tachypnea, tachycardia and lactic acid of 2.3.  He was started on cefepime and vancomycin.  Subsequently vancomycin was discontinued.  Patient improved significantly.  He was not having on oxygen this morning which he normally uses 24 hours at home.  He was assessed by PT OT who recommended home health.  He is being discharged in stable condition.  I have prescribed 3 more days of oral prednisone and 5 days of oral Levaquin.   Acute COPD exacerbation: End expiratory wheezes but he is not hypoxic.  Discharging on 3 more days of oral prednisone.   Type 2 diabetes mellitus without complication, without long-term current use of insulin (HCC) -Last A1c was 6.5 -Not taking home meds   Hypertension- (present on admission) Resume home medications.  Discharge plan was discussed with patient and/or family member and they verbalized understanding and agreed with it.  Discharge Diagnoses:  Principal Problem:   HCAP (healthcare-associated pneumonia) Active Problems:   COPD mixed type ([facility])   Hypertension  Type 2 diabetes mellitus without complication, without long-term current use of insulin (HCC)   Severe sepsis [facility])    Discharge Instructions   Allergies as of 02/22/2021   No Active Allergies      Medication List     TAKE these medications    benzonatate 200 MG capsule Commonly known as: TESSALON Take 1 capsule (200 mg total) by mouth 3 (three) times daily as needed for cough.   Flutter Devi Use 4 times daily   furosemide 20 MG tablet Commonly known as: LASIX Take 1 tablet (20 mg total) by mouth daily as needed for edema.   guaiFENesin 600 MG 12 hr  tablet Commonly known as: MUCINEX Take 1 tablet (600 mg total) by mouth 2 (two) times daily as needed for cough or to loosen phlegm.   levofloxacin 750 MG tablet Commonly known as: Levaquin Take 1 tablet (750 mg total) by mouth daily for 5 days.   losartan 50 MG tablet Commonly known as: COZAAR Take 1 tablet (50 mg total) by mouth daily.   metoprolol succinate 25 MG 24 hr tablet Commonly known as: TOPROL-XL Take 1 tablet (25 mg total) by mouth daily.   pantoprazole 40 MG tablet Commonly known as: PROTONIX Take 1 tablet (40 mg total) by mouth daily.   predniSONE 50 MG tablet Commonly known as: DELTASONE Take 1 tablet (50 mg total) by mouth daily with breakfast for 3 days. What changed:  medication strength how much to take additional instructions   pregabalin 75 MG capsule Commonly known as: Lyrica Take 1 capsule (75 mg total) by mouth 2 (two) times daily.   Trelegy Ellipta 100-62.5-25 MCG/ACT Aepb Generic drug: Fluticasone-Umeclidin-Vilant Inhale 1 puff into the lungs daily.   Ventolin HFA 108 (90 Base) MCG/ACT inhaler Generic drug: albuterol Inhale 2 puffs into the lungs every 4 (four) hours as needed. What changed: reasons to take this   albuterol (2.5 MG/3ML) 0.083% nebulizer solution Commonly known as: PROVENTIL TAKE 3 MLS (2.5 MG TOTAL) BY NEBULIZATION IN THE MORNING, AT NOON, AND AT BEDTIME. What changed:  how much to take how to take this when to take this        Follow-up Information     [name], MD Follow up in 1 week(s).   Specialty: Pulmonary Disease Contact information: [address] [address] [phone]         O'Neal, Cassie Freer, MD .   Specialties: Cardiology, Internal Medicine, Radiology Contact information: [place] [address] [phone]                No Active Allergies  Consultations: None   Procedures/Studies: DG Chest 2 View  Result Date:  01/26/2021 CLINICAL DATA:  Cough and wheezing for 3 weeks history of pneumonia EXAM: CHEST - 2 VIEW COMPARISON:  Chest radiograph 12/31/2020 FINDINGS: The cardiomediastinal silhouette is stable. There are patchy opacities in the lingula, increased since the prior study. There is no other focal consolidation. There is no pulmonary edema. Remote left-sided rib fractures and left clavicle fracture are again noted. There is no evidence of acute osseous abnormality. IMPRESSION: Patchy opacities in the lingula are increased since the prior study from 12/31/2020, could reflect atelectasis but is suspicious for developing infection. Electronically Signed   By: Valetta Mole M.D.   On: 01/26/2021 16:34   DG Chest Port 1 View  Result Date: 02/20/2021 CLINICAL DATA:  Shortness of breath. EXAM: PORTABLE CHEST 1 VIEW COMPARISON:  February 01, 2021. FINDINGS: The heart size and mediastinal contours are within normal limits. Multiple old left rib fractures are noted. Right lung is clear. Large left lower lobe airspace opacity is noted consistent with pneumonia. IMPRESSION: Large left lower lobe airspace opacity consistent with pneumonia. Followup PA and lateral chest X-ray is recommended in 3-4 weeks following trial of antibiotic therapy to ensure resolution and exclude underlying malignancy. Electronically Signed   By: Marijo Conception M.D.   On: 02/20/2021 08:53   DG Chest Portable 1 View  Result Date: 02/01/2021 CLINICAL DATA:  Shortness of breath EXAM: PORTABLE CHEST 1 VIEW COMPARISON:  Chest x-ray 01/26/2021 FINDINGS: There are minimal bibasilar opacities favored is atelectasis. There is no pleural effusion or pneumothorax. Cardiomediastinal silhouette is within normal limits. There are multiple healed left-sided rib fractures, unchanged. IMPRESSION: Minimal bibasilar atelectasis Electronically Signed   By: Ronney Asters M.D.   On: 02/01/2021 02:01     Discharge Exam: Vitals:   02/22/21 0720 02/22/21 0736  BP:  140/79   Pulse: 84   Resp: 18   Temp: 98 F (36.7 C)   SpO2: 94% 95%   Vitals:   02/21/21 2054 02/21/21 2300 02/22/21 0720 02/22/21 0736  BP: 106/70 128/79 140/79   Pulse: 81 84 84   Resp: (!) 22 20 18    Temp: 98.1 F (36.7 C) 98.7 F (37.1 C) 98 F (36.7 C)   TempSrc: Oral Oral Oral   SpO2: 96% 93% 94% 95%  Weight:      Height:        General: Pt is alert, awake, not in acute distress, obese Cardiovascular: RRR, S1/S2 +, no rubs, no gallops Respiratory: Very mild end expiratory wheezes, no rhonchi. Abdominal: Soft, NT, ND, bowel sounds + Extremities: no edema, no cyanosis    The results of significant diagnostics from this hospitalization (including imaging, microbiology, ancillary and laboratory) are listed below for reference.     Microbiology: Recent Results (from the past 240 hour(s))  Resp Panel by RT-PCR (Flu A&B, Covid)     Status: Abnormal   Collection Time: 02/20/21  8:41 AM   Specimen: Nasopharyngeal(NP) swabs in vial transport medium  Result Value Ref Range Status   SARS Coronavirus 2 by RT PCR POSITIVE (A) NEGATIVE Final    Comment: (NOTE) SARS-CoV-2 target nucleic acids are DETECTED.  The SARS-CoV-2 RNA is generally detectable in upper respiratory specimens during the acute phase of infection. Positive results are indicative of the presence of the identified virus, but do not rule out bacterial infection or co-infection with other pathogens not detected by the test. Clinical correlation with patient history and other diagnostic information is necessary to determine patient infection status. The expected result is Negative.  Fact Sheet for Patients: EntrepreneurPulse.com.au  Fact Sheet for Healthcare Providers: IncredibleEmployment.be  This test is not yet approved or cleared by the [place] FDA and  has been authorized for detection and/or diagnosis of SARS-CoV-2 by FDA under an Emergency Use  Authorization (EUA).  This EUA will remain in effect (meaning this test can be used) for the duration of  the COVID-19 declaration under Section 564(b)(1) of the A ct, 21 U.S.C. section 360bbb-3(b)(1), unless the authorization is terminated or revoked sooner.     Influenza A by PCR NEGATIVE NEGATIVE Final   Influenza B by PCR NEGATIVE NEGATIVE Final    Comment: (NOTE) The Xpert Xpress SARS-CoV-2/FLU/RSV plus assay is intended as an aid in the diagnosis of influenza from Nasopharyngeal swab specimens and should  not be used as a sole basis for treatment. Nasal washings and aspirates are unacceptable for Xpert Xpress SARS-CoV-2/FLU/RSV testing.  Fact Sheet for Patients: EntrepreneurPulse.com.au  Fact Sheet for Healthcare Providers: IncredibleEmployment.be  This test is not yet approved or cleared by the [place] FDA and has been authorized for detection and/or diagnosis of SARS-CoV-2 by FDA under an Emergency Use Authorization (EUA). This EUA will remain in effect (meaning this test can be used) for the duration of the COVID-19 declaration under Section 564(b)(1) of the Act, 21 U.S.C. section 360bbb-3(b)(1), unless the authorization is terminated or revoked.  Performed at [facility], [place] [address], [place], [place] [address]   Culture, blood (routine x 2)     Status: None (Preliminary result)   Collection Time: 02/20/21  8:55 AM   Specimen: BLOOD RIGHT ARM  Result Value Ref Range Status   Specimen Description BLOOD RIGHT ARM  Final   Special Requests   Final    BOTTLES DRAWN AEROBIC AND ANAEROBIC Blood Culture adequate volume   Culture   Final    NO GROWTH 2 DAYS Performed at [facility], [place] [address] [place], [place] [address]    Report Status PENDING  Incomplete  Culture, blood (routine x 2)     Status: None (Preliminary result)   Collection Time: 02/20/21  9:00 AM   Specimen: BLOOD  Result Value Ref Range  Status   Specimen Description BLOOD SITE NOT SPECIFIED  Final   Special Requests   Final    BOTTLES DRAWN AEROBIC AND ANAEROBIC Blood Culture adequate volume   Culture   Final    NO GROWTH 2 DAYS Performed at [facility], [address], [place],  [address]    Report Status PENDING  Incomplete     Labs: BNP (last 3 results) Recent Labs    03/26/20 0441 06/26/20 0432 02/01/21 0142  BNP 101.5* 28.2 51.7   Basic Metabolic Panel: Recent Labs  Lab 02/20/21 0848 02/20/21 0850 02/20/21 0905 02/21/21 0224 02/22/21 0242  NA 139 142 142 140 137  K 4.1 4.2 4.2 4.4 4.3  CL  --  103 103 106 106  CO2  --  29  --  26 23  GLUCOSE  --  209* 200* 189* 209*  BUN  --  12 16 15  23*  CREATININE  --  1.17 1.00 0.77 0.88  CALCIUM  --  9.0  --  8.8* 8.6*   Liver Function Tests: Recent Labs  Lab 02/20/21 0850  AST 24  ALT 30  ALKPHOS 98  BILITOT 0.4  PROT 6.3*  ALBUMIN 3.8   No results for input(s): LIPASE, AMYLASE in the last 168 hours. No results for input(s): AMMONIA in the last 168 hours. CBC: Recent Labs  Lab 02/20/21 0848 02/20/21 0850 02/20/21 0905 02/21/21 0224 02/22/21 0242  WBC  --  9.1  --  17.2* 15.5*  NEUTROABS  --  7.8*  --   --  14.0*  HGB 15.3 15.2 16.7 12.6* 11.0*  HCT 45.0 47.8 49.0 39.2 35.3*  MCV  --  96.2  --  94.2 95.9  PLT  --  297  --  213 202   Cardiac Enzymes: No results for input(s): CKTOTAL, CKMB, CKMBINDEX, TROPONINI in the last 168 hours. BNP: Invalid input(s): POCBNP CBG: Recent Labs  Lab 02/21/21 0822 02/21/21 1159 02/21/21 1538 02/21/21 2051 02/22/21 0804  GLUCAP 141* 147* 204* 148* 133*   D-Dimer No results for  input(s): DDIMER in the last 72 hours. Hgb A1c No results for input(s): HGBA1C in the last 72 hours. Lipid Profile No results for input(s): CHOL, HDL, LDLCALC, TRIG, CHOLHDL, LDLDIRECT in the last 72 hours. Thyroid function studies No results for input(s): TSH, T4TOTAL, T3FREE, THYROIDAB in the last 72  hours.  Invalid input(s): FREET3 Anemia work up No results for input(s): VITAMINB12, FOLATE, FERRITIN, TIBC, IRON, RETICCTPCT in the last 72 hours. Urinalysis    Component Value Date/Time   COLORURINE STRAW (A) 04/19/2020 1150   APPEARANCEUR CLEAR 04/19/2020 1150   LABSPEC 1.006 04/19/2020 1150   PHURINE 7.0 04/19/2020 1150   GLUCOSEU 150 (A) 04/19/2020 1150   HGBUR NEGATIVE 04/19/2020 1150   BILIRUBINUR NEGATIVE 04/19/2020 1150   BILIRUBINUR negative 09/25/2018 1432   KETONESUR NEGATIVE 04/19/2020 1150   PROTEINUR NEGATIVE 04/19/2020 1150   UROBILINOGEN 0.2 02/19/2019 1029   NITRITE NEGATIVE 04/19/2020 1150   LEUKOCYTESUR NEGATIVE 04/19/2020 1150   Sepsis Labs Invalid input(s): PROCALCITONIN,  WBC,  LACTICIDVEN Microbiology Recent Results (from the past 240 hour(s))  Resp Panel by RT-PCR (Flu A&B, Covid)     Status: Abnormal   Collection Time: 02/20/21  8:41 AM   Specimen: Nasopharyngeal(NP) swabs in vial transport medium  Result Value Ref Range Status   SARS Coronavirus 2 by RT PCR POSITIVE (A) NEGATIVE Final    Comment: (NOTE) SARS-CoV-2 target nucleic acids are DETECTED.  The SARS-CoV-2 RNA is generally detectable in upper respiratory specimens during the acute phase of infection. Positive results are indicative of the presence of the identified virus, but do not rule out bacterial infection or co-infection with other pathogens not detected by the test. Clinical correlation with patient history and other diagnostic information is necessary to determine patient infection status. The expected result is Negative.  Fact Sheet for Patients: EntrepreneurPulse.com.au  Fact Sheet for Healthcare Providers: IncredibleEmployment.be  This test is not yet approved or cleared by the [place] FDA and  has been authorized for detection and/or diagnosis of SARS-CoV-2 by FDA under an Emergency Use Authorization (EUA).  This EUA will remain  in effect (meaning this test can be used) for the duration of  the COVID-19 declaration under Section 564(b)(1) of the A ct, 21 U.S.C. section 360bbb-3(b)(1), unless the authorization is terminated or revoked sooner.     Influenza A by PCR NEGATIVE NEGATIVE Final   Influenza B by PCR NEGATIVE NEGATIVE Final    Comment: (NOTE) The Xpert Xpress SARS-CoV-2/FLU/RSV plus assay is intended as an aid in the diagnosis of influenza from Nasopharyngeal swab specimens and should not be used as a sole basis for treatment. Nasal washings and aspirates are unacceptable for Xpert Xpress SARS-CoV-2/FLU/RSV testing.  Fact Sheet for Patients: EntrepreneurPulse.com.au  Fact Sheet for Healthcare Providers: IncredibleEmployment.be  This test is not yet approved or cleared by the [place] FDA and has been authorized for detection and/or diagnosis of SARS-CoV-2 by FDA under an Emergency Use Authorization (EUA). This EUA will remain in effect (meaning this test can be used) for the duration of the COVID-19 declaration under Section 564(b)(1) of the Act, 21 U.S.C. section 360bbb-3(b)(1), unless the authorization is terminated or revoked.  Performed at [facility], [place] [address] [place], [place] [address]   Culture, blood (routine x 2)     Status: None (Preliminary result)   Collection Time: 02/20/21  8:55 AM   Specimen: BLOOD RIGHT ARM  Result Value Ref Range Status   Specimen Description BLOOD RIGHT ARM  Final  Special Requests   Final    BOTTLES DRAWN AEROBIC AND ANAEROBIC Blood Culture adequate volume   Culture   Final    NO GROWTH 2 DAYS Performed at [facility], [place] [address] [place], [place] [address]    Report Status PENDING  Incomplete  Culture, blood (routine x 2)     Status: None (Preliminary result)   Collection Time: 02/20/21  9:00 AM   Specimen: BLOOD  Result Value Ref Range Status   Specimen Description BLOOD SITE NOT  SPECIFIED  Final   Special Requests   Final    BOTTLES DRAWN AEROBIC AND ANAEROBIC Blood Culture adequate volume   Culture   Final    NO GROWTH 2 DAYS Performed at [facility], [address] [place], [place] [address]    Report Status PENDING  Incomplete     Time coordinating discharge: Over 30 minutes  SIGNED:   [name], MD  [facility] 02/22/2021, 10:58 AM  If 7PM-7AM, please contact night-coverage [email or web address]

## 2021-02-23 ENCOUNTER — Other Ambulatory Visit (HOSPITAL_BASED_OUTPATIENT_CLINIC_OR_DEPARTMENT_OTHER): Payer: Self-pay

## 2021-02-23 ENCOUNTER — Telehealth: Payer: Self-pay | Admitting: *Deleted

## 2021-02-23 ENCOUNTER — Telehealth: Payer: Self-pay

## 2021-02-23 NOTE — Telephone Encounter (Signed)
Transition Care Management Unsuccessful Follow-up Telephone Call   Date of discharge and from where: [facility] on 02/22/2021   Attempts:  1st Attempt   Reason for unsuccessful TCM follow-up call:  Unable to leave message. VM is full

## 2021-02-23 NOTE — Telephone Encounter (Signed)
Transition Care Management Follow-up Telephone Call   Date of discharge and from where:[facility] on 02/22/2021 How have you been since you were released from the hospital? Called pt at [phone] no answered , called [name] at [phone] Pt. Signed DPH for sister [name] , stated she spoke with brother earlier today and he was feeling fine  Any questions or concerns? No questions/concerns reported.  Items Reviewed: Did the pt receive and understand the discharge instructions provided? Sister stated have the instructions and have no questions.  Medications obtained and verified? She said she was not aware and will f/u with brother soon Any new allergies since your discharge? Sister not aware  Do you have support at home? Yes, sister [name]  Other (ie: DME, Home Health, etc)   sister not aware     Functional Questionnaire: (I = Independent and D = Dependent) ADL's:  Independent.        Follow up appointments reviewed:   PCP Hospital f/u appt confirmed? [name] on 03/10/2021 @ 9:30.  [facility] f/u appt confirmed? scheduled at this time  Are transportation arrangements needed? have transportation   If their condition worsens, is the pt aware to call  their PCP or go to the ED? Made sister [name] aware Was the patient provided with contact information for the PCP's office or ED? Sister said pt has the phone number  Was the pt encouraged to call back with questions or concerns?yes

## 2021-02-23 NOTE — Telephone Encounter (Signed)
Transition Care Management Unsuccessful Follow-up Telephone Call  Date of discharge and from where:  02/22/2021 - [facility]  Attempts:  1st Attempt  Reason for unsuccessful TCM follow-up call:  Unable to leave message

## 2021-02-25 ENCOUNTER — Other Ambulatory Visit: Payer: Self-pay

## 2021-02-25 LAB — CULTURE, BLOOD (ROUTINE X 2)
Culture: NO GROWTH
Culture: NO GROWTH
Special Requests: ADEQUATE
Special Requests: ADEQUATE

## 2021-03-02 ENCOUNTER — Other Ambulatory Visit: Payer: Medicaid Other | Admitting: *Deleted

## 2021-03-02 ENCOUNTER — Other Ambulatory Visit: Payer: Self-pay

## 2021-03-02 NOTE — Patient Instructions (Addendum)
Visit Information  James [name] was given information about Medicaid Managed Care team care coordination services as a part of their Healthy [facility] Medicaid benefit. James [name] verbally consented to engagement with the [facility] Managed Care team.   If you are experiencing a medical emergency, please call 911 or report to your local emergency department or urgent care.   If you have a non-emergency medical problem during routine business hours, please contact your provider's office and ask to speak with a nurse.   For questions related to your Healthy [facility] health plan, please call: [phone] or visit the homepage here: [email or web address]  If you would like to schedule transportation through your Healthy [name] plan, please call the following number at least 2 days in advance of your appointment: [phone]  For information about your ride after you set it up, call Ride Assist at [phone]. Use this number to activate a Will Call pickup, or if your transportation is late for a scheduled pickup. Use this number, too, if you need to make a change or cancel a previously scheduled reservation.  If you need transportation services right away, call [phone]. The after-hours call center is staffed 24 hours to handle ride assistance and urgent reservation requests (including discharges) 365 days a year. Urgent trips include sick visits, hospital discharge requests and life-sustaining treatment.  Call the [place] at [phone], at any time, 24 hours a day, 7 days a week. If you are in danger or need immediate medical attention call 911.  If you would like help to quit smoking, call 1-800-QUIT-NOW (802)123-8330) OR Espaol: 1-855-Djelo-Ya (4-742-595-6387) o para ms informacin haga clic aqu or Text READY to 200-400 to register via text  James [name],   Please see education materials related to COPD and COVID  provided as print materials.   The patient verbalized understanding of instructions, educational materials, and care plan provided today and agreed to receive a mailed copy of patient instructions, educational materials, and care plan.   The Managed Medicaid care management team will reach out to the patient again over the next 14 days.   [name] RN, BSN [place] RN Care Coordinator   Following is a copy of your plan of care:  Care Plan : RN Care Manager Plan of Care  Updates made by [name], RN since 03/02/2021 12:00 AM     Problem: Chronic Disease Management needs in patient with COPD   Priority: High     Long-Range Goal: Plan of care established for for Chronic Disease management for patient with COPD   Start Date: 11/13/2020  Expected End Date: 03/03/2021  Priority: High  Note:   Current Barriers:  Care Coordination needs related to Limited access to food  Chronic Disease Management support and education needs related to COPD James [name] was hospitalized on 02/01/21-02/04/21 for SOB, +Covid, he was readmitted on 2/17-2/19/23 for worsening SOB. James [name] reports feeling "better" today. When asked if he had any needs, he replied "a new set of lungs." James [name] was unable to get his discharge medications and does not have transportation to his follow up appointment with James [name]. He will ask his friend to take him today to pick up prescriptions prescribed at discharge.  RNCM Clinical Goal(s):  Patient will verbalize understanding of plan for management of COPD as evidenced by taking all medications as prescribed, attending all scheduled appointments and contacting provider with questions and concerns take all medications  exactly as prescribed and will call provider for medication related questions as evidenced by refilling medications and taking as prescribed    attend all scheduled medical appointments: 03/10/21 at 9:30am with James [name] and  03/31/21 at 2:45pm with Cardiology as evidenced by provider notes in EMR        demonstrate improved adherence to prescribed treatment plan for COPD as evidenced by taking all medications as prescribed, attending all scheduled appointments and contacting provider with questions and concerns continue to work with RN Care Manager and/or Social Worker to address care management and care coordination needs related to COPD as evidenced by adherence to CM Team Scheduled appointments     through collaboration with RN Care manager, provider, and care team.   Interventions: Inter-disciplinary care team collaboration (see longitudinal plan of care) Evaluation of current treatment plan related to  self management and patient's adherence to plan as established by provider   SDOH Barriers (Status:  Goal on track:  Yes. Patient interviewed and SDOH assessment performed        SDOH Interventions    Flowsheet Row Most Recent Value  SDOH Interventions   Transportation Interventions Other (Comment)  [Assisted patient with arranging transportation to PCP on 03/10/21, via [employer] [phone]     Patient interviewed and appropriate assessments performed Discussed plans with patient for ongoing care management follow up and provided patient with direct contact information for care management team- RNCM assisted with arranging transportation provided by [facility], with [employer] [phone], to hospital follow up appointment with James [name], pick up time 8:55am on 03/10/21, and return home pick up time 11:00am confirmation #[id number] RNCM attempted to scheduled transportation to pharmacy, patient declined pick up on 03/04/21, which was next available day. He will ask his friend to take him today   COPD: (Status: Goal on Track (progressing): YES.) Long Term Goal  Reviewed medications with patient, including use of prescribed maintenance and rescue inhalers, and provided instruction on medication management and the  importance of adherence Advised patient to use nebulizer as directed, three times daily Provided instruction about proper use of medications used for management of COPD including inhalers Provided education about and advised patient to utilize infection prevention strategies to reduce risk of respiratory infection Discussed the importance of adequate rest and management of fatigue with COPD Assessed social determinant of health barriers Advised patient to support his chest with a pillow when coughing    COVID Interventions:  (Status:  New goal.) Short Term Goal Counseled on importance of regular laboratory monitoring as prescribed Assessed social determinant of health barriers Advised patient on the importance of follow up and infection prevention  Patient Goals/Self-Care Activities: Take medications as prescribed   Attend all scheduled provider appointments Call pharmacy for medication refills 3-7 days in advance of running out of medications Perform all self care activities independently  Call provider office for new concerns or questions  - avoid second hand smoke - limit outdoor activity during cold weather - keep follow-up appointments: 03/10/21 @ 9:30am with James [name] - eat healthy/prescribed diet: DASH - get at least 7 to 8 hours of sleep at night

## 2021-03-02 NOTE — Patient Outreach (Signed)
Medicaid Managed Care   Nurse Care Manager Note  03/02/2021 Name:  James [name] MRN:  [id number] DOB:  August 23, 1960  James [name] is an 61 y.o. year old male who is a primary patient of James Gaskins Burnett Harry, [name].  The [facility] Managed Care Coordination team was consulted for assistance with:    COPD COVID  James [name] was given information about Medicaid Managed Care Coordination team services today. James [name] Patient agreed to services and verbal consent obtained.  Engaged with patient by telephone for follow up visit in response to provider referral for case management and/or care coordination services.   Assessments/Interventions:  Review of past medical history, allergies, medications, health status, including review of consultants reports, laboratory and other test data, was performed as part of comprehensive evaluation and provision of chronic care management services.  SDOH (Social Determinants of Health) assessments and interventions performed: SDOH Interventions    Flowsheet Row Most Recent Value  SDOH Interventions   Transportation Interventions Other (Comment)  [Assisted patient with arranging transportation to PCP on 03/10/21, via [employer] [phone]]       Care Plan  No Known Allergies  Medications Reviewed Today     Reviewed by James Montane, RN (Registered Nurse) on 03/02/21 at [place] List Status: <None>   Medication Order Taking? Sig Documenting Provider Last Dose Status Informant  albuterol (PROVENTIL) (2.5 MG/3ML) 0.083% nebulizer solution [id number] Yes TAKE 3 MLS (2.5 MG TOTAL) BY NEBULIZATION IN THE MORNING, AT NOON, AND AT BEDTIME.  Patient taking differently: Take 2.5 mg by nebulization at bedtime.   James [name] Taking Active Self           Med Note James [name], James [name]   Sat Feb 21, 2021 11:45 AM)    albuterol (VENTOLIN HFA) 108 [phone] Base) MCG/ACT inhaler [id number] Yes Inhale 2 puffs into the lungs every 4 (four) hours as needed.   Patient taking differently: Inhale 2 puffs into the lungs every 4 (four) hours as needed for wheezing.   James [name] Taking Active Self  benzonatate (TESSALON) 200 MG capsule [id number] Yes Take 1 capsule (200 mg total) by mouth 3 (three) times daily as needed for cough. James Lose, [name] Taking Active Self  Fluticasone-Umeclidin-[facility] ELLIPTA) 100-62.5-25 MCG/ACT AEPB [id number] Yes Inhale 1 puff into the lungs daily. James [name] Taking Active Self  furosemide (LASIX) 20 MG tablet [id number] Yes Take 1 tablet (20 mg total) by mouth daily as needed for edema. James [name] Taking Active Self  guaiFENesin (MUCINEX) 600 MG 12 hr tablet [id number] No Take 1 tablet (600 mg total) by mouth 2 (two) times daily as needed for cough or to loosen phlegm.  Patient not taking: Reported on 03/02/2021   James [name], [name] Not Taking Active Self  losartan (COZAAR) 50 MG tablet [id number] Yes Take 1 tablet (50 mg total) by mouth daily. James [name] Taking Active Self  metoprolol succinate (TOPROL-XL) 25 MG 24 hr tablet [id number] Yes Take 1 tablet (25 mg total) by mouth daily. James [name] Taking Active Self  pantoprazole (PROTONIX) 40 MG tablet [id number] Yes Take 1 tablet (40 mg total) by mouth daily. James [name] Taking Active Self  pregabalin (LYRICA) 75 MG capsule [id number] Yes Take 1 capsule (75 mg total) by mouth 2 (two) times daily. James [name] Taking Active Self           Med Note (  Murvin Natal [facility] [name]   Sat Feb 21, 2021 11:39 AM)    Respiratory Therapy Supplies (FLUTTER) DEVI [id number] Yes Use 4 times daily James [name] Taking Active Self            Patient Active Problem List   Diagnosis Date Noted   HCAP (healthcare-associated pneumonia) 02/20/2021   COPD with chronic bronchitis ([place]) 02/01/2021   GERD (gastroesophageal reflux disease) 02/01/2021   Neuropathy 09/29/2020   Severe sepsis ([place]) 04/19/2020   Bronchiectasis  ([place]) 09/24/2019   Type 2 diabetes mellitus without complication, without long-term current use of insulin ([place]) 07/24/2019   Other atopic dermatitis 05/01/2019   Chronic respiratory failure with hypoxia ([place]) 04/23/2019   Intellectual disability 04/23/2019   History of alcohol use 11/23/2018   Hypertension 05/17/2016   History of tobacco abuse 04/06/2016   COPD mixed type ([place]) 03/26/2016    Conditions to be addressed/monitored per PCP order:  COPD and COVID  Care Plan : RN Care Manager Plan of Care  Updates made by James Montane, RN since 03/02/2021 12:00 AM     Problem: Chronic Disease Management needs in patient with COPD   Priority: High     Long-Range Goal: Plan of care established for for Chronic Disease management for patient with COPD   Start Date: 11/13/2020  Expected End Date: 03/03/2021  Priority: High  Note:   Current Barriers:  Care Coordination needs related to Limited access to food  Chronic Disease Management support and education needs related to COPD James [name] was hospitalized on 02/01/21-02/04/21 for SOB, +Covid, he was readmitted on 2/17-2/19/23 for worsening SOB. James [name] reports feeling "better" today. When asked if he had any needs, he replied "a new set of lungs." James [name] was unable to get his discharge medications and does not have transportation to his follow up appointment with [name]. He will ask his friend to take him today to pick up prescriptions prescribed at discharge.  RNCM Clinical Goal(s):  Patient will verbalize understanding of plan for management of COPD as evidenced by taking all medications as prescribed, attending all scheduled appointments and contacting provider with questions and concerns take all medications exactly as prescribed and will call provider for medication related questions as evidenced by refilling medications and taking as prescribed    attend all scheduled medical appointments: 03/10/21 at 9:30am with [name] and  03/31/21 at 2:45pm with Cardiology as evidenced by provider notes in EMR        demonstrate improved adherence to prescribed treatment plan for COPD as evidenced by taking all medications as prescribed, attending all scheduled appointments and contacting provider with questions and concerns continue to work with RN Care Manager and/or Social Worker to address care management and care coordination needs related to COPD as evidenced by adherence to CM Team Scheduled appointments     through collaboration with RN Care manager, provider, and care team.   Interventions: Inter-disciplinary care team collaboration (see longitudinal plan of care) Evaluation of current treatment plan related to  self management and patient's adherence to plan as established by provider   SDOH Barriers (Status:  Goal on track:  Yes. Patient interviewed and SDOH assessment performed        SDOH Interventions    Flowsheet Row Most Recent Value  SDOH Interventions   Transportation Interventions Other (Comment)  [Assisted patient with arranging transportation to PCP on 03/10/21, via [employer] [phone]     Patient interviewed and appropriate assessments performed Discussed  plans with patient for ongoing care management follow up and provided patient with direct contact information for care management team- RNCM assisted with arranging transportation provided by [name], with [employer] [phone], to hospital follow up appointment with [name], pick up time 8:55am on 03/10/21, and return home pick up time 11:00am confirmation #[id number] RNCM attempted to scheduled transportation to pharmacy, patient declined pick up on 03/04/21, which was next available day. He will ask his friend to take him today   COPD: (Status: Goal on Track (progressing): YES.) Long Term Goal  Reviewed medications with patient, including use of prescribed maintenance and rescue inhalers, and provided instruction on medication management and the  importance of adherence Advised patient to use nebulizer as directed, three times daily Provided instruction about proper use of medications used for management of COPD including inhalers Provided education about and advised patient to utilize infection prevention strategies to reduce risk of respiratory infection Discussed the importance of adequate rest and management of fatigue with COPD Assessed social determinant of health barriers Advised patient to support his chest with a pillow when coughing    COVID Interventions:  (Status:  New goal.) Short Term Goal Counseled on importance of regular laboratory monitoring as prescribed Assessed social determinant of health barriers Advised patient on the importance of follow up and infection prevention  Patient Goals/Self-Care Activities: Take medications as prescribed   Attend all scheduled provider appointments Call pharmacy for medication refills 3-7 days in advance of running out of medications Perform all self care activities independently  Call provider office for new concerns or questions  - avoid second hand smoke - limit outdoor activity during cold weather - keep follow-up appointments: 03/10/21 @ 9:30am with [name] - eat healthy/prescribed diet: DASH - get at least 7 to 8 hours of sleep at night       Follow Up:  Patient agrees to Care Plan and Follow-up.  Plan: The Managed Medicaid care management team will reach out to the patient again over the next 14 days.  Date/time of next scheduled RN care management/care coordination outreach:  03/16/21 @ 10:30am  [name] RN, BSN [place] RN Care Coordinator

## 2021-03-08 NOTE — Progress Notes (Signed)
Established Patient Office Visit  Subjective:  Patient ID: James [name], male    DOB: 1960/07/01  Age: 61 y.o. MRN: [id number]  CC:  Chief Complaint  Patient presents with   Hospitalization Follow-up    HPI James [name] presents for posthospital visit.  Note this patient was discharged in the hospital greater than 2 weeks ago so this is not a transition of care visit.  Patient was readmitted on 17 February after he fell in his workshop injuring his left ribs developed increased shortness of breath cough found to have an HCAP pneumonia and persistent COVID.  He is unvaccinated.  He also has steadfastly refused pneumonia vaccinations previously.  Patient is off all diabetic medications on arrival blood sugar is 147.  He was supposed to have 5 days of prednisone and Levaquin on discharge he never had these prescriptions filled.  He has difficulty with transportation.  His truck is been taken from him that he was using for transportation.  He is still living on the same property lived before and his landlord and employer is late letting him stay in the house but did take his truck.  Patient is still using the Trelegy daily.  Maintains losartan metoprolol pantoprazole.  He also takes furosemide if needed.  He is wishing refills on his medications.  Below is a copy of the discharge summary.  Admit date: 02/20/2021 Discharge date: 02/22/2021 30 Day Unplanned Readmission Risk Score     Flowsheet Row ED to Hosp-Admission (Current) from 02/20/2021 in [facility]  30 Day Unplanned Readmission Risk Score (%) 29.18 Filed at 02/22/2021 0801           This score is the patient's risk of an unplanned readmission within 30 days of being discharged (0 -100%). The score is based on dignosis, age, lab data, medications, orders, and past utilization.   Low:  0-14.9   Medium: 15-21.9   High: 22-29.9   Extreme: 30 and above                 Admitted From:  Home Disposition: Home   Recommendations for Outpatient Follow-up:  Follow up with PCP in 1-2 weeks Please obtain BMP/CBC in one week Please follow up with your PCP on the following pending results: Unresulted Labs (From admission, onward)        Start     Ordered    02/20/21 1151   Expectorated Sputum Assessment w Gram Stain, Rflx to Resp Cult  (COPD / Pneumonia / Cellulitis / Lower Extremity Wound)  Once,   R        02/20/21 1157    02/20/21 0851   MRSA Next Gen by PCR, Nasal  Once,   STAT        02/20/21 0850    02/20/21 0840   Urinalysis, Routine w reflex microscopic  Once,   STAT        02/20/21 0840                    Home Health: Yes Equipment/Devices: None   Discharge Condition: Stable CODE STATUS: Full code Diet recommendation: Cardiac   Subjective: Seen and examined.  He feels better.  He was saturating 97% on room air during my visit.  He is willing to go home   Brief/Interim Summary: HPI: James [name] is a 61 y.o. male with medical history significant of COPD on 2-4L home O2; ETOH dependence; DM; and HTN  presented with SOB. He was most recently hospitalized from 1/29-2/1 with COPD exacerbation related to COVID-19 infection.  He was treated with IV steroids.  He was doing well until the day of admission when he started having worsening shortness of breath so he came to the ED.  Admitted to hospital service with following issues.   Severe sepsis secondary to HCAP (healthcare-associated pneumonia)/chronic hypoxic respiratory failure- (present on admission): Patient meets severe sepsis criteria based on tachypnea, tachycardia and lactic acid of 2.3.  He was started on cefepime and vancomycin.  Subsequently vancomycin was discontinued.  Patient improved significantly.  He was not having on oxygen this morning which he normally uses 24 hours at home.  He was assessed by PT OT who recommended home health.  He is being discharged in stable condition.  I have prescribed 3 more  days of oral prednisone and 5 days of oral Levaquin.   Acute COPD exacerbation: End expiratory wheezes but he is not hypoxic.  Discharging on 3 more days of oral prednisone.   Type 2 diabetes mellitus without complication, without long-term current use of insulin (HCC) -Last A1c was 6.5 -Not taking home meds   Hypertension- (present on admission) Resume home medications.   Discharge plan was discussed with patient and/or family member and they verbalized understanding and agreed with it.  Discharge Diagnoses:  Principal Problem:   HCAP (healthcare-associated pneumonia) Active Problems:   COPD mixed type ([place])   Hypertension   Type 2 diabetes mellitus without complication, without long-term current use of insulin ([place])   Severe sepsis ([place])   Past Medical History:  Diagnosis Date   Acute on chronic respiratory failure with hypoxia ([place]) 06/08/2013   CAP (community acquired pneumonia) 05/17/2018   See admit 05/17/18 ? rml  with   covid pcr neg - rx augmentin > f/u cxr in 4-6 weeks is fine unless condition declines    Community acquired pneumonia of right lower lobe of lung 05/17/2018   See admit 05/17/18 ? 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   Tick bite 06/03/2018    Past Surgical History:  Procedure Laterality Date   LUNG REMOVAL, PARTIAL     VIDEO ASSISTED THORACOSCOPY (VATS)/THOROCOTOMY Left 06/11/2013   Procedure: VIDEO ASSISTED THORACOSCOPY (VATS)/THOROCOTOMY;  Surgeon: Ivin Poot, MD;  Location: [facility];  Service: Thoracic;  Laterality: Left;   VIDEO BRONCHOSCOPY N/A 06/11/2013   Procedure: VIDEO BRONCHOSCOPY;   Surgeon: Ivin Poot, MD;  Location: [facility];  Service: Thoracic;  Laterality: N/A;   VIDEO BRONCHOSCOPY N/A 08/10/2018   Procedure: VIDEO BRONCHOSCOPY WITH FLUORO;  Surgeon: Candee Furbish, MD;  Location: [facility];  Service: Endoscopy;  Laterality: N/A;    Family History  Problem Relation Age of Onset   Heart disease Father    COPD Sister     Social History   Socioeconomic History   Marital status: Divorced    Spouse name: Not on file   Number of children: Not on file   Years of education: Not on file   Highest education  level: Not on file  Occupational History   Occupation: Dealer   Occupation: Curator  Tobacco Use   Smoking status: Former    Packs/day: 1.50    Years: 40.00    Pack years: 60.00    Types: Cigarettes    Quit date: 2014    Years since quitting: 9.1   Smokeless tobacco: Former    Types: Nurse, children's Use: Never used  Substance and Sexual Activity   Alcohol use: Not Currently    Alcohol/week: 28.0 standard drinks    Types: 28 Cans of beer per week    Comment: 4 beers a night previously   Drug use: Not Currently    Types: Marijuana    Comment: daily; quit using crack and methamphetamine about 5 months ago    Sexual activity: Not Currently    Partners: Female  Other Topics Concern   Not on file  Social History Narrative   Lives alone near [place] Determinants of Health   Financial Resource Strain: Low Risk    Difficulty of Paying Living Expenses: Not very hard  Food Insecurity: Food Insecurity Present   Worried About Running Out of Food in the Last Year: Sometimes true   Ran Out of Food in the Last Year: Sometimes true  Transportation Needs: Unmet Transportation Needs   Lack of Transportation (Medical): Yes   Lack of Transportation (Non-Medical): No  Physical Activity: Inactive   Days of Exercise per Week: 0 days   Minutes of Exercise per Session: 0 min  Stress: No Stress Concern Present   Feeling of Stress :  Not at all  Social Connections: Moderately Integrated   Frequency of Communication with Friends and Family: More than three times a week   Frequency of Social Gatherings with Friends and Family: Once a week   Attends Religious Services: 1 to 4 times per year   Active Member of [employer] or Organizations: Yes   Attends Archivist Meetings: 1 to 4 times per year   Marital Status: Divorced  Human resources officer Violence: Not on file    Outpatient Medications Prior to Visit  Medication Sig Dispense Refill   albuterol (PROVENTIL) (2.5 MG/3ML) 0.083% nebulizer solution TAKE 3 MLS (2.5 MG TOTAL) BY NEBULIZATION IN THE MORNING, AT NOON, AND AT BEDTIME. (Patient taking differently: Take 2.5 mg by nebulization at bedtime.) 270 mL 1   albuterol (VENTOLIN HFA) 108 (90 Base) MCG/ACT inhaler Inhale 2 puffs into the lungs every 4 (four) hours as needed. (Patient taking differently: Inhale 2 puffs into the lungs every 4 (four) hours as needed for wheezing.) 18 g 0   guaiFENesin (MUCINEX) 600 MG 12 hr tablet Take 1 tablet (600 mg total) by mouth 2 (two) times daily as needed for cough or to loosen phlegm.     Respiratory Therapy Supplies (FLUTTER) DEVI Use 4 times daily 1 each 0   Fluticasone-Umeclidin-Vilant (TRELEGY ELLIPTA) 100-62.5-25 MCG/ACT AEPB Inhale 1 puff into the lungs daily. 60 each 2   furosemide (LASIX) 20 MG tablet Take 1 tablet (20 mg total) by mouth daily as needed for edema. 30 tablet 2   losartan (COZAAR) 50 MG tablet Take 1 tablet (50 mg total) by mouth daily. 90 tablet 1   metoprolol succinate (TOPROL-XL) 25 MG 24 hr tablet Take 1 tablet (25 mg total) by mouth daily. 90 tablet 3   pantoprazole (PROTONIX) 40 MG tablet Take 1 tablet (40 mg total) by mouth daily. 60 tablet 6  pregabalin (LYRICA) 75 MG capsule Take 1 capsule (75 mg total) by mouth 2 (two) times daily. 60 capsule 1   benzonatate (TESSALON) 200 MG capsule Take 1 capsule (200 mg total) by mouth 3 (three) times daily as needed  for cough. 20 capsule 0   No facility-administered medications prior to visit.    No Known Allergies  ROS Review of Systems  Constitutional: Negative.   HENT: Negative.  Negative for ear pain, postnasal drip, rhinorrhea, sinus pressure, sore throat, trouble swallowing and voice change.   Eyes: Negative.   Respiratory:  Positive for cough, shortness of breath and wheezing. Negative for apnea, choking, chest tightness and stridor.   Cardiovascular:  Positive for chest pain. Negative for palpitations and leg swelling.  Gastrointestinal: Negative.  Negative for abdominal distention, abdominal pain, nausea and vomiting.  Genitourinary: Negative.   Musculoskeletal: Negative.  Negative for arthralgias and myalgias.  Skin: Negative.  Negative for rash.  Allergic/Immunologic: Negative.  Negative for environmental allergies and food allergies.  Neurological: Negative.  Negative for dizziness, syncope, weakness and headaches.  Hematological: Negative.  Negative for adenopathy. Does not bruise/bleed easily.  Psychiatric/Behavioral: Negative.  Negative for agitation and sleep disturbance. The patient is not nervous/anxious.      Objective:    Physical Exam Vitals reviewed.  Constitutional:      Appearance: Normal appearance. He is well-developed. He is not diaphoretic.  HENT:     Head: Normocephalic and atraumatic.     Nose: No nasal deformity, septal deviation, mucosal edema or rhinorrhea.     Right Sinus: No maxillary sinus tenderness or frontal sinus tenderness.     Left Sinus: No maxillary sinus tenderness or frontal sinus tenderness.     Mouth/Throat:     Pharynx: No oropharyngeal exudate.  Eyes:     General: No scleral icterus.    Conjunctiva/sclera: Conjunctivae normal.     Pupils: Pupils are equal, round, and reactive to light.  Neck:     Thyroid: No thyromegaly.     Vascular: No carotid bruit or JVD.     Trachea: Trachea normal. No tracheal tenderness or tracheal deviation.   Cardiovascular:     Rate and Rhythm: Normal rate and regular rhythm.     Chest Wall: PMI is not displaced.     Pulses: Normal pulses. No decreased pulses.     Heart sounds: Normal heart sounds, S1 normal and S2 normal. Heart sounds not distant. No murmur heard. No systolic murmur is present.  No diastolic murmur is present.    No friction rub. No gallop. No S3 or S4 sounds.  Pulmonary:     Effort: Pulmonary effort is normal. No tachypnea, accessory muscle usage or respiratory distress.     Breath sounds: No stridor. Wheezing and rhonchi present. No decreased breath sounds or rales.     Comments: Left lower lobe chest tenderness  Chest:     Chest wall: Tenderness present.  Abdominal:     General: Bowel sounds are normal. There is no distension.     Palpations: Abdomen is soft. Abdomen is not rigid.     Tenderness: There is no abdominal tenderness. There is no guarding or rebound.  Musculoskeletal:        General: Normal range of motion.     Cervical back: Normal range of motion and neck supple. No edema, erythema or rigidity. No muscular tenderness. Normal range of motion.     Right lower leg: No edema.     Left lower leg:  No edema.  Lymphadenopathy:     Head:     Right side of head: No submental or submandibular adenopathy.     Left side of head: No submental or submandibular adenopathy.     Cervical: No cervical adenopathy.  Skin:    General: Skin is warm and dry.     Coloration: Skin is not pale.     Findings: No rash.     Nails: There is no clubbing.  Neurological:     General: No focal deficit present.     Mental Status: He is alert and oriented to person, place, and time. Mental status is at baseline.     Sensory: No sensory deficit.  Psychiatric:        Mood and Affect: Mood normal.        Speech: Speech normal.        Behavior: Behavior normal.        Thought Content: Thought content normal.        Judgment: Judgment normal.    BP 137/82    Pulse (!) 58    Wt  197 lb 6.4 oz (89.5 kg)    SpO2 92%    BMI 29.15 kg/m  Wt Readings from Last 3 Encounters:  03/10/21 197 lb 6.4 oz (89.5 kg)  02/20/21 199 lb 11.8 oz (90.6 kg)  02/12/21 205 lb 6.4 oz (93.2 kg)     There are no preventive care reminders to display for this patient.  There are no preventive care reminders to display for this patient.  Lab Results  Component Value Date   TSH 0.325 (L) 04/19/2020   Lab Results  Component Value Date   WBC 15.5 (H) 02/22/2021   HGB 11.0 (L) 02/22/2021   HCT 35.3 (L) 02/22/2021   MCV 95.9 02/22/2021   PLT 202 02/22/2021   Lab Results  Component Value Date   NA 137 02/22/2021   K 4.3 02/22/2021   CO2 23 02/22/2021   GLUCOSE 209 (H) 02/22/2021   BUN 23 (H) 02/22/2021   CREATININE 0.88 02/22/2021   BILITOT 0.4 02/20/2021   ALKPHOS 98 02/20/2021   AST 24 02/20/2021   ALT 30 02/20/2021   PROT 6.3 (L) 02/20/2021   ALBUMIN 3.8 02/20/2021   CALCIUM 8.6 (L) 02/22/2021   ANIONGAP 8 02/22/2021   EGFR 105 02/12/2021   No results found for: CHOL No results found for: HDL No results found for: [facility] Lab Results  Component Value Date   TRIG 95 10/21/2019   No results found for: CHOLHDL Lab Results  Component Value Date   HGBA1C 6.5 12/22/2020      Assessment & Plan:   Problem List Items Addressed This Visit       Cardiovascular and Mediastinum   Hypertension (Chronic)   Relevant Medications   furosemide (LASIX) 20 MG tablet   losartan (COZAAR) 50 MG tablet   metoprolol succinate (TOPROL-XL) 25 MG 24 hr tablet   Other Relevant Orders   Basic Metabolic Panel   CBC with Differential/Platelet     Respiratory   Chronic respiratory failure with hypoxia (HCC)    Patient does have oxygen he wears at night      COPD with chronic bronchitis ([place])    Plan refill on Trelegy      Relevant Medications   predniSONE (DELTASONE) 10 MG tablet   Fluticasone-Umeclidin-Vilant (TRELEGY ELLIPTA) 100-62.5-25 MCG/ACT AEPB   HCAP  (healthcare-associated pneumonia)    Left lower lobe HCAP organism not specified blood  cultures were negative  Patient needs to continue to take antibiotics will receive Levaquin 500 mg daily for another 5 days will also receive pulse prednisone 40 mg a day for 5 days then reduce to 1 a day thereafter        Relevant Medications   levofloxacin (LEVAQUIN) 500 MG tablet   Fluticasone-Umeclidin-Vilant (TRELEGY ELLIPTA) 100-62.5-25 MCG/ACT AEPB   Other Relevant Orders   CBC with Differential/Platelet     Endocrine   Type 2 diabetes mellitus without complication, without long-term current use of insulin ([place]) - Primary    Patient is off all diabetic medications at this time he will not take medications for his diabetes      Relevant Medications   losartan (COZAAR) 50 MG tablet   Other Relevant Orders   Basic Metabolic Panel   POCT glucose (manual entry) (Completed)     Nervous and Auditory   Neuropathy    Maintaining Lyrica for neuropathy symptoms        Other   History of alcohol use    Denies any alcohol use currently      RESOLVED: Severe sepsis ([place])    Sepsis has resolved       Meds ordered this encounter  Medications   predniSONE (DELTASONE) 10 MG tablet    Sig: Take 4 tablets daily for 5 days then one daily    Dispense:  60 tablet    Refill:  3   levofloxacin (LEVAQUIN) 500 MG tablet    Sig: Take 1 tablet (500 mg total) by mouth daily.    Dispense:  5 tablet    Refill:  0   Fluticasone-Umeclidin-Vilant (TRELEGY ELLIPTA) 100-62.5-25 MCG/ACT AEPB    Sig: Inhale 1 puff into the lungs daily.    Dispense:  60 each    Refill:  2    Fill NOW   furosemide (LASIX) 20 MG tablet    Sig: Take 1 tablet (20 mg total) by mouth daily as needed for edema.    Dispense:  30 tablet    Refill:  2   losartan (COZAAR) 50 MG tablet    Sig: Take 1 tablet (50 mg total) by mouth daily.    Dispense:  90 tablet    Refill:  1   metoprolol succinate (TOPROL-XL) 25 MG 24 hr tablet     Sig: Take 1 tablet (25 mg total) by mouth daily.    Dispense:  90 tablet    Refill:  3   pantoprazole (PROTONIX) 40 MG tablet    Sig: Take 1 tablet (40 mg total) by mouth daily.    Dispense:  60 tablet    Refill:  6   pregabalin (LYRICA) 75 MG capsule    Sig: Take 1 capsule (75 mg total) by mouth 2 (two) times daily.    Dispense:  60 capsule    Refill:  1  Patient declines to receive pneumonia vaccination  38 minutes spent performing history and physical reviewing discharge records trying to educate patient on the importance of a pneumonia vaccine complex decision making is high risk of death and hospital repeat readmission is high Follow-up: Return in about 6 weeks (around 04/21/2021).    Asencion Noble, MD

## 2021-03-09 ENCOUNTER — Other Ambulatory Visit (HOSPITAL_BASED_OUTPATIENT_CLINIC_OR_DEPARTMENT_OTHER): Payer: Self-pay

## 2021-03-10 ENCOUNTER — Other Ambulatory Visit: Payer: Self-pay

## 2021-03-10 ENCOUNTER — Encounter: Payer: Self-pay | Admitting: Critical Care Medicine

## 2021-03-10 ENCOUNTER — Ambulatory Visit: Payer: Medicaid Other | Attending: Critical Care Medicine | Admitting: Critical Care Medicine

## 2021-03-10 VITALS — BP 137/82 | HR 58 | Wt 197.4 lb

## 2021-03-10 DIAGNOSIS — J189 Pneumonia, unspecified organism: Secondary | ICD-10-CM

## 2021-03-10 DIAGNOSIS — Z87898 Personal history of other specified conditions: Secondary | ICD-10-CM | POA: Diagnosis not present

## 2021-03-10 DIAGNOSIS — J449 Chronic obstructive pulmonary disease, unspecified: Secondary | ICD-10-CM

## 2021-03-10 DIAGNOSIS — R6520 Severe sepsis without septic shock: Secondary | ICD-10-CM

## 2021-03-10 DIAGNOSIS — J9611 Chronic respiratory failure with hypoxia: Secondary | ICD-10-CM | POA: Diagnosis not present

## 2021-03-10 DIAGNOSIS — A419 Sepsis, unspecified organism: Secondary | ICD-10-CM | POA: Diagnosis not present

## 2021-03-10 DIAGNOSIS — E119 Type 2 diabetes mellitus without complications: Secondary | ICD-10-CM | POA: Diagnosis not present

## 2021-03-10 DIAGNOSIS — I10 Essential (primary) hypertension: Secondary | ICD-10-CM | POA: Diagnosis not present

## 2021-03-10 DIAGNOSIS — G629 Polyneuropathy, unspecified: Secondary | ICD-10-CM

## 2021-03-10 LAB — GLUCOSE, POCT (MANUAL RESULT ENTRY): POC Glucose: 148 mg/dl — AB (ref 70–99)

## 2021-03-10 MED ORDER — TRELEGY ELLIPTA 100-62.5-25 MCG/ACT IN AEPB
1.0000 | INHALATION_SPRAY | Freq: Every day | RESPIRATORY_TRACT | 2 refills | Status: DC
  Filled 2021-03-10: qty 60, 30d supply, fill #0
  Filled 2021-04-29: qty 60, 30d supply, fill #1

## 2021-03-10 MED ORDER — PREDNISONE 10 MG PO TABS
ORAL_TABLET | ORAL | 3 refills | Status: DC
  Filled 2021-03-10: qty 45, 30d supply, fill #0
  Filled 2021-04-07: qty 45, 30d supply, fill #1
  Filled 2021-04-29: qty 30, 30d supply, fill #2

## 2021-03-10 MED ORDER — LOSARTAN POTASSIUM 50 MG PO TABS
50.0000 mg | ORAL_TABLET | Freq: Every day | ORAL | 1 refills | Status: DC
  Filled 2021-03-10: qty 90, 90d supply, fill #0
  Filled 2021-06-22: qty 90, 90d supply, fill #1

## 2021-03-10 MED ORDER — FUROSEMIDE 20 MG PO TABS
20.0000 mg | ORAL_TABLET | Freq: Every day | ORAL | 2 refills | Status: DC | PRN
  Filled 2021-03-10 – 2021-04-07 (×2): qty 30, 30d supply, fill #0
  Filled 2021-04-29: qty 30, 30d supply, fill #1

## 2021-03-10 MED ORDER — PREGABALIN 75 MG PO CAPS
75.0000 mg | ORAL_CAPSULE | Freq: Two times a day (BID) | ORAL | 1 refills | Status: DC
  Filled 2021-03-10: qty 60, 30d supply, fill #0

## 2021-03-10 MED ORDER — LEVOFLOXACIN 500 MG PO TABS
500.0000 mg | ORAL_TABLET | Freq: Every day | ORAL | 0 refills | Status: DC
  Filled 2021-03-10: qty 5, 5d supply, fill #0

## 2021-03-10 MED ORDER — PANTOPRAZOLE SODIUM 40 MG PO TBEC
40.0000 mg | DELAYED_RELEASE_TABLET | Freq: Every day | ORAL | 6 refills | Status: DC
  Filled 2021-03-10 – 2021-04-29 (×2): qty 60, 60d supply, fill #0

## 2021-03-10 MED ORDER — METOPROLOL SUCCINATE ER 25 MG PO TB24
25.0000 mg | ORAL_TABLET | Freq: Every day | ORAL | 3 refills | Status: DC
  Filled 2021-03-10: qty 90, 90d supply, fill #0
  Filled 2021-06-09: qty 90, 90d supply, fill #1

## 2021-03-10 NOTE — Assessment & Plan Note (Signed)
Sepsis has resolved ?

## 2021-03-10 NOTE — Assessment & Plan Note (Signed)
Left lower lobe HCAP organism not specified blood cultures were negative ? ?Patient needs to continue to take antibiotics will receive Levaquin 500 mg daily for another 5 days will also receive pulse prednisone 40 mg a day for 5 days then reduce to 1 a day thereafter ? ? ?

## 2021-03-10 NOTE — Assessment & Plan Note (Signed)
Patient is off all diabetic medications at this time he will not take medications for his diabetes ?

## 2021-03-10 NOTE — Assessment & Plan Note (Signed)
Denies any alcohol use currently ?

## 2021-03-10 NOTE — Assessment & Plan Note (Signed)
Maintaining Lyrica for neuropathy symptoms ?

## 2021-03-10 NOTE — Assessment & Plan Note (Signed)
Plan refill on Trelegy ?

## 2021-03-10 NOTE — Patient Instructions (Signed)
Stop by the laboratory for lab draws today ? ?Go to the pharmacy after you leave the clinic to pick up your medications including resuming prednisone take as directed and Levaquin antibiotic for 5 days take as directed ? ?Also refills on your Trelegy losartan metoprolol pantoprazole furosemide pregabalin were sent ? ?You declined to receive the pneumonia vaccine booster shot ? ?Return to see [name] 6 weeks ? ? ?

## 2021-03-10 NOTE — Assessment & Plan Note (Signed)
Patient does have oxygen he wears at night ?

## 2021-03-11 ENCOUNTER — Other Ambulatory Visit: Payer: Self-pay | Admitting: Critical Care Medicine

## 2021-03-11 ENCOUNTER — Other Ambulatory Visit: Payer: Self-pay

## 2021-03-11 LAB — BASIC METABOLIC PANEL
BUN/Creatinine Ratio: 12 (ref 9–20)
BUN: 11 mg/dL (ref 6–24)
CO2: 26 mmol/L (ref 20–29)
Calcium: 9.2 mg/dL (ref 8.7–10.2)
Chloride: 103 mmol/L (ref 96–106)
Creatinine, Ser: 0.91 mg/dL (ref 0.76–1.27)
Glucose: 183 mg/dL — ABNORMAL HIGH (ref 70–99)
Potassium: 3.8 mmol/L (ref 3.5–5.2)
Sodium: 144 mmol/L (ref 134–144)
eGFR: 97 mL/min/{1.73_m2} (ref >59)

## 2021-03-11 LAB — CBC WITH DIFFERENTIAL/PLATELET
Basophils Absolute: 0.1 10*3/uL (ref 0.0–0.2)
Basos: 1 %
EOS (ABSOLUTE): 0.1 10*3/uL (ref 0.0–0.4)
Eos: 1 %
Hematocrit: 42.3 % (ref 37.5–51.0)
Hemoglobin: 14.2 g/dL (ref 13.0–17.7)
Immature Grans (Abs): 0 10*3/uL (ref 0.0–0.1)
Immature Granulocytes: 0 %
Lymphocytes Absolute: 2.2 10*3/uL (ref 0.7–3.1)
Lymphs: 25 %
MCH: 30.1 pg (ref 26.6–33.0)
MCHC: 33.6 g/dL (ref 31.5–35.7)
MCV: 90 fL (ref 79–97)
Monocytes Absolute: 0.9 10*3/uL (ref 0.1–0.9)
Monocytes: 10 %
Neutrophils Absolute: 5.7 10*3/uL (ref 1.4–7.0)
Neutrophils: 63 %
Platelets: 328 10*3/uL (ref 150–450)
RBC: 4.71 x10E6/uL (ref 4.14–5.80)
RDW: 12.4 % (ref 11.6–15.4)
WBC: 9.1 10*3/uL (ref 3.4–10.8)

## 2021-03-11 MED ORDER — GLIPIZIDE ER 5 MG PO TB24
5.0000 mg | ORAL_TABLET | Freq: Every day | ORAL | 1 refills | Status: DC
  Filled 2021-03-11 – 2021-04-07 (×2): qty 60, 60d supply, fill #0

## 2021-03-12 ENCOUNTER — Telehealth: Payer: Self-pay

## 2021-03-12 NOTE — Telephone Encounter (Signed)
-----   Message from [name], MD sent at 03/11/2021  5:45 AM EST ----- ?Let pt know blood sugar is high, I do recommend starting a new blood sugar pill.  I am sending a prescription for glipizide to our pharmacy to take one pill in the AM daily.    All other labs were normal ?

## 2021-03-12 NOTE — Telephone Encounter (Signed)
Pt was called and is aware of results, DOB was confirmed.  ?

## 2021-03-13 ENCOUNTER — Other Ambulatory Visit: Payer: Self-pay

## 2021-03-16 ENCOUNTER — Other Ambulatory Visit: Payer: Self-pay | Admitting: *Deleted

## 2021-03-16 ENCOUNTER — Other Ambulatory Visit: Payer: Self-pay

## 2021-03-16 NOTE — Patient Outreach (Signed)
Medicaid Managed Care   Nurse Care Manager Note  03/16/2021 Name:  James [name] MRN:  [id number] DOB:  01-Oct-1960  James [name] is an 61 y.o. year old male who is [name] primary patient of James [name] James Cradle, MD.  The [facility] Managed Care Coordination team was consulted for assistance with:    COPD  James [name] was given information about Medicaid Managed Care Coordination team services today. James [name] Patient agreed to services and verbal consent obtained.  Engaged with patient by telephone for follow up visit in response to provider referral for case management and/or care coordination services.   Assessments/Interventions:  Review of past medical history, allergies, medications, health status, including review of consultants reports, laboratory and other test data, was performed as part of comprehensive evaluation and provision of chronic care management services.  SDOH (Social Determinants of Health) assessments and interventions performed: SDOH Interventions    Flowsheet Row Most Recent Value  SDOH Interventions   Food Insecurity Interventions Intervention Not Indicated       Care Plan  No Known Allergies  Medications Reviewed Today     Reviewed by James Dach, RN (Registered Nurse) on 03/16/21 at 1029  Med List Status: <None>   Medication Order Taking? Sig Documenting Provider Last Dose Status Informant  albuterol (PROVENTIL) (2.5 MG/3ML) 0.083% nebulizer solution [id number] Yes TAKE 3 MLS (2.5 MG TOTAL) BY NEBULIZATION IN THE MORNING, AT NOON, AND AT BEDTIME.  Patient taking differently: Take 2.5 mg by nebulization at bedtime.   James Frisk, MD Taking Active Self           Med Note James [name], James [name]   Sat Feb 21, 2021 11:45 AM)    albuterol (VENTOLIN HFA) 108 [phone] Base) MCG/ACT inhaler [id number] Yes Inhale 2 puffs into the lungs every 4 (four) hours as needed.  Patient taking differently: Inhale 2 puffs into the lungs every 4 (four) hours as needed for  wheezing.   James Frisk, MD Taking Active Self  Fluticasone-Umeclidin-Vilant [facility] ELLIPTA) 100-62.5-25 MCG/ACT AEPB [id number] Yes Inhale 1 puff into the lungs daily. James Frisk, MD Taking Active   furosemide (LASIX) 20 MG tablet [id number] Yes Take 1 tablet (20 mg total) by mouth daily as needed for edema. James Frisk, MD Taking Active   glipiZIDE (GLUCOTROL XL) 5 MG 24 hr tablet [id number] No Take 1 tablet (5 mg total) by mouth daily with breakfast.  Patient not taking: Reported on 03/16/2021   James Frisk, MD Not Taking Active   guaiFENesin (MUCINEX) 600 MG 12 hr tablet [id number] Yes Take 1 tablet (600 mg total) by mouth 2 (two) times daily as needed for cough or to loosen phlegm. James Priestly, MD Taking Active Self  levofloxacin (LEVAQUIN) 500 MG tablet [id number] Yes Take 1 tablet (500 mg total) by mouth daily. James Frisk, MD Taking Active   losartan (COZAAR) 50 MG tablet [id number] Yes Take 1 tablet (50 mg total) by mouth daily. James Frisk, MD Taking Active   metoprolol succinate (TOPROL-XL) 25 MG 24 hr tablet [id number] Yes Take 1 tablet (25 mg total) by mouth daily. James Frisk, MD Taking Active   pantoprazole (PROTONIX) 40 MG tablet [id number] Yes Take 1 tablet (40 mg total) by mouth daily. James Frisk, MD Taking Active   predniSONE (DELTASONE) 10 MG tablet [id number] Yes Take 4 tablets daily for 5 days then one daily James Frisk, MD Taking Active  Med Note (James [name]   Mon Mar 16, 2021 10:28 AM) Patient taking 2 tablets [name] day  pregabalin (LYRICA) 75 MG capsule [id number] Yes Take 1 capsule (75 mg total) by mouth 2 (two) times daily. James Frisk, MD Taking Active            Med Note James [name] Mar 16, 2021 10:29 AM) Taking once daily  Respiratory Therapy Supplies (FLUTTER) DEVI [id number] Yes Use 4 times daily James Frisk, MD Taking Active Self            Patient Active Problem List   Diagnosis  Date Noted   HCAP (healthcare-associated pneumonia) 02/20/2021   COPD with chronic bronchitis (HCC) 02/01/2021   GERD (gastroesophageal reflux disease) 02/01/2021   Neuropathy 09/29/2020   Bronchiectasis (HCC) 09/24/2019   Type 2 diabetes mellitus without complication, without long-term current use of insulin (HCC) 07/24/2019   Other atopic dermatitis 05/01/2019   Chronic respiratory failure with hypoxia (HCC) 04/23/2019   Intellectual disability 04/23/2019   History of alcohol use 11/23/2018   Hypertension 05/17/2016   History of tobacco abuse 04/06/2016   COPD mixed type (HCC) 03/26/2016    Conditions to be addressed/monitored per PCP order:  COPD  Care Plan : RN Care Manager Plan of Care  Updates made by James Dach, RN since 03/16/2021 12:00 AM     Problem: Chronic Disease Management needs in patient with COPD   Priority: High     Long-Range Goal: Plan of care established for for Chronic Disease management for patient with COPD   Start Date: 11/13/2020  Expected End Date: 05/01/2021  Priority: High  Note:   Current Barriers:  Care Coordination needs related to Limited access to food  Chronic Disease Management support and education needs related to COPD James [name] attended PCP appointment with Dr. Delford [name]. He has not been able to pick up new medication for DMII. James [name] needed assistance arranging transportation to [facility] appointment on 03/31/21.  RNCM Clinical Goal(s):  Patient will verbalize understanding of plan for management of COPD as evidenced by taking all medications as prescribed, attending all scheduled appointments and contacting provider with questions and concerns take all medications exactly as prescribed and will call provider for medication related questions as evidenced by refilling medications and taking as prescribed    attend all scheduled medical appointments: 03/31/21 at 2:45pm with Cardiology as evidenced by provider notes in EMR         demonstrate improved adherence to prescribed treatment plan for COPD as evidenced by taking all medications as prescribed, attending all scheduled appointments and contacting provider with questions and concerns continue to work with RN Care Manager and/or Social Worker to address care management and care coordination needs related to COPD as evidenced by adherence to CM Team Scheduled appointments     through collaboration with RN Care manager, provider, and care team.   Interventions: Inter-disciplinary care team collaboration (see longitudinal plan of care) Evaluation of current treatment plan related to  self management and patient's adherence to plan as established by provider Discussed the importance of picking up glipizide and taking as directed Reviewed diabetic diet and provided examples   SDOH Barriers (Status:  Goal on track:  Yes. Patient interviewed and SDOH assessment performed        SDOH Interventions    Flowsheet Row Most Recent Value  SDOH Interventions   Food Insecurity Interventions Intervention Not Indicated     Patient  interviewed and appropriate assessments performed Discussed plans with patient for ongoing care management follow up and provided patient with direct contact information for care management team- RNCM assisted with arranging transportation provided by [facility], with [employer] [phone], to hospital follow up appointment with [name], pick up time 2:20pm on 03/31/21, and return home pick up time 4:30pm confirmation #[id number]    COPD: (Status: Goal on Track (progressing): YES.) Long Term Goal  Reviewed medications with patient, including use of prescribed maintenance and rescue inhalers, and provided instruction on medication management and the importance of adherence Advised patient to use nebulizer as directed, three times daily Provided instruction about proper use of medications used for management of COPD including inhalers Provided  education about and advised patient to utilize infection prevention strategies to reduce risk of respiratory infection Discussed the importance of adequate rest and management of fatigue with COPD Assessed social determinant of health barriers Advised patient to support his chest with [name] pillow when coughing    COVID Interventions:  (Status:  Goal Met.) Short Term Goal Counseled on importance of regular laboratory monitoring as prescribed Assessed social determinant of health barriers Advised patient on the importance of follow up and infection prevention  Patient Goals/Self-Care Activities: Take medications as prescribed   Attend all scheduled provider appointments Call pharmacy for medication refills 3-7 days in advance of running out of medications Perform all self care activities independently  Call provider office for new concerns or questions  - avoid second hand smoke - limit outdoor activity during cold weather - keep follow-up appointments: 03/31/21 at [facility] - eat healthy/prescribed diet: DASH - get at least 7 to 8 hours of sleep at night       Follow Up:  Patient agrees to Care Plan and Follow-up.  Plan: The Managed Medicaid care management team will reach out to the patient again over the next 30 days.  Date/time of next scheduled RN care management/care coordination outreach:  04/15/21 @ 10:30am  [name] RN, BSN [place]  [facility] RN Care Coordinator

## 2021-03-16 NOTE — Patient Instructions (Signed)
Visit Information ? ?James [name] was given information about Medicaid Managed Care team care coordination services as a part of their Healthy [facility] Medicaid benefit. James [name] verbally consented to engagement with the [facility] Managed Care team.  ? ?If you are experiencing a medical emergency, please call 911 or report to your local emergency department or urgent care.  ? ?If you have a non-emergency medical problem during routine business hours, please contact your provider's office and ask to speak with a nurse.  ? ?For questions related to your Healthy [facility] health plan, please call: [phone] or visit the homepage here: [email or web address] ? ?If you would like to schedule transportation through your Healthy [facility] plan, please call the following number at least 2 days in advance of your appointment: [phone] ? For information about your ride after you set it up, call Ride Assist at [phone]. Use this number to activate a Will Call pickup, or if your transportation is late for a scheduled pickup. Use this number, too, if you need to make a change or cancel a previously scheduled reservation. ? If you need transportation services right away, call [phone]. The after-hours call center is staffed 24 hours to handle ride assistance and urgent reservation requests (including discharges) 365 days a year. Urgent trips include sick visits, hospital discharge requests and life-sustaining treatment. ? ?Call the [place] at [phone], at any time, 24 hours a day, 7 days a week. If you are in danger or need immediate medical attention call 911. ? ?If you would like help to quit smoking, call 1-800-QUIT-NOW (520)460-6965) OR Espa?ol: 1-855-D?jelo-Ya 514-433-0480) o para m?s informaci?n haga clic aqu? or Text READY to 200-400 to register via text ? ?James [name], ? ? ?Please see education materials related to DM provided as print  materials.  ? ?The patient verbalized understanding of instructions, educational materials, and care plan provided today and agreed to receive a mailed copy of patient instructions, educational materials, and care plan.  ? ?Telephone follow up appointment with Managed Medicaid care management team member scheduled for:04/15/21 @ 10:30am ? ?James Joiner RN, BSN ?[place] ?RN Care Coordinator ? ? ?Following is a copy of your plan of care:  ?Care Plan : [place] of Care  ?Updates made by James Montane, RN since 03/16/2021 12:00 AM  ?  ? ?Problem: Chronic Disease Management needs in patient with COPD   ?Priority: High  ?  ? ?Long-Range Goal: Plan of care established for for Chronic Disease management for patient with COPD   ?Start Date: 11/13/2020  ?Expected End Date: 05/01/2021  ?Priority: High  ?Note:   ?Current Barriers:  ?Care Coordination needs related to Limited access to food  ?Chronic Disease Management support and education needs related to COPD ?James [name] attended PCP appointment with [name]. He has not been able to pick up new medication for DMII. James [name] needed assistance arranging transportation to [facility] appointment on 03/31/21. ? ?RNCM Clinical Goal(s):  ?Patient will verbalize understanding of plan for management of COPD as evidenced by taking all medications as prescribed, attending all scheduled appointments and contacting provider with questions and concerns ?take all medications exactly as prescribed and will call provider for medication related questions as evidenced by refilling medications and taking as prescribed    ?attend all scheduled medical appointments: 03/31/21 at 2:45pm with Cardiology as evidenced by provider notes in EMR        ?demonstrate improved adherence  to prescribed treatment plan for COPD as evidenced by taking all medications as prescribed, attending all scheduled appointments and contacting provider with questions and  concerns ?continue to work with RN Care Manager and/or Social Worker to address care management and care coordination needs related to COPD as evidenced by adherence to CM Team Scheduled appointments     through collaboration with Consulting civil engineer, provider, and care team.  ? ?Interventions: ?Inter-disciplinary care team collaboration (see longitudinal plan of care) ?Evaluation of current treatment plan related to  self management and patient's adherence to plan as established by provider ?Discussed the importance of picking up glipizide and taking as directed ?Reviewed diabetic diet and provided examples ? ? ?SDOH Barriers (Status:  Goal on track:  Yes. ?Patient interviewed and SDOH assessment performed ?       ?SDOH Interventions   ? ?Flowsheet Row Most Recent Value  ?SDOH Interventions   ?Food Insecurity Interventions Intervention Not Indicated  ? ?  ?Patient interviewed and appropriate assessments performed ?Discussed plans with patient for ongoing care management follow up and provided patient with direct contact information for care management team- ?RNCM assisted with arranging transportation provided by [facility], with [employer] [phone], to hospital follow up appointment with [facility], pick up time 2:20pm on 03/31/21, and return home pick up time 4:30pm confirmation #[id number]  ? ? ?COPD: (Status: Goal on Track (progressing): YES.) Long Term Goal  ?Reviewed medications with patient, including use of prescribed maintenance and rescue inhalers, and provided instruction on medication management and the importance of adherence ?Advised patient to use nebulizer as directed, three times daily ?Provided instruction about proper use of medications used for management of COPD including inhalers ?Provided education about and advised patient to utilize infection prevention strategies to reduce risk of respiratory infection ?Discussed the importance of adequate rest and management of fatigue with COPD ?Assessed  social determinant of health barriers ?Advised patient to support his chest with a pillow when coughing  ? ? ?COVID Interventions:  (Status:  Goal Met.) Short Term Goal ?Counseled on importance of regular laboratory monitoring as prescribed ?Assessed social determinant of health barriers ?Advised patient on the importance of follow up and infection prevention ? ?Patient Goals/Self-Care Activities: ?Take medications as prescribed   ?Attend all scheduled provider appointments ?Call pharmacy for medication refills 3-7 days in advance of running out of medications ?Perform all self care activities independently  ?Call provider office for new concerns or questions  ?- avoid second hand smoke ?- limit outdoor activity during cold weather ?- keep follow-up appointments: 03/31/21 at [facility] ?- eat healthy/prescribed diet: DASH ?- get at least 7 to 8 hours of sleep at night ? ? ?  ? ?  ?

## 2021-03-17 ENCOUNTER — Inpatient Hospital Stay: Payer: Medicaid Other | Admitting: Critical Care Medicine

## 2021-03-18 ENCOUNTER — Other Ambulatory Visit: Payer: Self-pay

## 2021-03-23 ENCOUNTER — Ambulatory Visit: Payer: Medicaid Other | Admitting: Critical Care Medicine

## 2021-03-31 ENCOUNTER — Other Ambulatory Visit: Payer: Self-pay

## 2021-03-31 ENCOUNTER — Encounter: Payer: Self-pay | Admitting: Nurse Practitioner

## 2021-03-31 ENCOUNTER — Ambulatory Visit: Payer: Medicaid Other | Admitting: Nurse Practitioner

## 2021-03-31 VITALS — BP 120/68 | HR 90 | Ht 69.5 in | Wt 197.0 lb

## 2021-03-31 DIAGNOSIS — F1021 Alcohol dependence, in remission: Secondary | ICD-10-CM

## 2021-03-31 DIAGNOSIS — I10 Essential (primary) hypertension: Secondary | ICD-10-CM | POA: Diagnosis not present

## 2021-03-31 DIAGNOSIS — I77810 Thoracic aortic ectasia: Secondary | ICD-10-CM | POA: Diagnosis not present

## 2021-03-31 DIAGNOSIS — J449 Chronic obstructive pulmonary disease, unspecified: Secondary | ICD-10-CM

## 2021-03-31 DIAGNOSIS — R0609 Other forms of dyspnea: Secondary | ICD-10-CM | POA: Diagnosis not present

## 2021-03-31 DIAGNOSIS — R000 Tachycardia, unspecified: Secondary | ICD-10-CM

## 2021-03-31 DIAGNOSIS — E119 Type 2 diabetes mellitus without complications: Secondary | ICD-10-CM

## 2021-03-31 NOTE — Progress Notes (Signed)
? ? ?Office Visit  ?  ?Patient Name: James [name] ?Date of Encounter: 03/31/2021 ? ?Primary Care Provider:  [name], MD ?Primary Cardiologist:  Evalina Field, MD ? ?Chief Complaint  ?  ?61 year old male with a history of hypertension, severe COPD on home O2, EtOH dependence, and type 2 diabetes who presents for follow-up related to hypertension, tachycardia, and chronic dyspnea. ? ?Past Medical History  ?  ?Past Medical History:  ?Diagnosis Date  ? Acute on chronic respiratory failure with hypoxia ([place]) 06/08/2013  ? CAP (community acquired pneumonia) 05/17/2018  ? See admit 05/17/18 ? rml  with   covid pcr neg - rx augmentin > f/u cxr in 4-6 weeks is fine unless condition declines   ? Community acquired pneumonia of right lower lobe of lung 05/17/2018  ? See admit 05/17/18 ? rml  with   covid pcr neg - rx augmentin > f/u cxr in 4-6 weeks is fine unless condition declines   ? COPD (chronic obstructive pulmonary disease) ([place])   ? not on home  ? COVID-19 virus infection 10/19/2019  ? Diabetes ([place])   ? Dyspnea   ? Hypertension   ? Pneumonia 04/06/2016  ? Pneumonia due to COVID-19 virus 10/19/2019  ? Pneumothorax   ? 2016, fell from ladder  ? 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 Tick bite 06/03/2018  ? ?Past Surgical History:  ?Procedure Laterality Date  ? LUNG REMOVAL, PARTIAL    ? VIDEO ASSISTED THORACOSCOPY (VATS)/THOROCOTOMY Left 06/11/2013  ? Procedure: VIDEO ASSISTED THORACOSCOPY (VATS)/THOROCOTOMY;  Surgeon: Ivin Poot, MD;  Location: [place];  Service: Thoracic;  Laterality: Left;  ? VIDEO BRONCHOSCOPY N/A 06/11/2013  ? Procedure: VIDEO BRONCHOSCOPY;  Surgeon: Ivin Poot, MD;  Location: [place];  Service: Thoracic;  Laterality: N/A;  ? VIDEO BRONCHOSCOPY N/A 08/10/2018  ? Procedure: VIDEO BRONCHOSCOPY WITH FLUORO;  Surgeon: Candee Furbish, MD;  [facility] ENDOSCOPY;  Service: Endoscopy;  Laterality: N/A;  ? ? ?Allergies ? ?No Known Allergies ? ?History of Present Illness  ?  ?61 year old  male with the above past medical history including hypertension, severe COPD on home O2, EtOH dependence, and type 2 diabetes. ? ?He was evaluated by Dr. Audie Box in 2021 in the setting of shortness of breath.  His shortness of breath was thought to be secondary to severe COPD.  Cardiac exam was benign.  Echocardiogram showed EF 60 to 65%, no RWMA, moderate LVH, mild dilation of aortic root, 41 mm.  Follow-up was recommended as needed. He was hospitalized from 02/20/2021 to 02/22/2021 in the setting of sepsis secondary to CAP.  He was tachycardic upon arrival to the ED setting of acute sepsis. ? ?He presents today for follow-up.  Since his last visit he has been stable from a cardiac standpoint.  He denies symptoms concerning for angina, denies worsening dyspnea.  Overall, he reports feeling well and denies any specific concerns or complaints today. ? ?Home Medications  ?  ?Current Outpatient Medications  ?Medication Sig Dispense Refill  ? albuterol (PROVENTIL) (2.5 MG/3ML) 0.083% nebulizer solution TAKE 3 MLS (2.5 MG TOTAL) BY NEBULIZATION IN THE MORNING, AT NOON, AND AT BEDTIME. (Patient taking differently: Take 2.5 mg by nebulization at bedtime.) 270 mL 1  ? albuterol (VENTOLIN HFA) 108 (90 Base) MCG/ACT inhaler Inhale 2 puffs into the lungs every 4 (four) hours as needed. 18 g 0  ? Fluticasone-Umeclidin-Vilant (TRELEGY ELLIPTA) 100-62.5-25 MCG/ACT AEPB Inhale 1 puff into the lungs daily. 60 each 2  ?  furosemide (LASIX) 20 MG tablet Take 1 tablet (20 mg total) by mouth daily as needed for edema. 30 tablet 2  ? glipiZIDE (GLUCOTROL XL) 5 MG 24 hr tablet Take 1 tablet (5 mg total) by mouth daily with breakfast. 60 tablet 1  ? losartan (COZAAR) 50 MG tablet Take 1 tablet (50 mg total) by mouth daily. 90 tablet 1  ? metoprolol succinate (TOPROL-XL) 25 MG 24 hr tablet Take 1 tablet (25 mg total) by mouth daily. 90 tablet 3  ? pantoprazole (PROTONIX) 40 MG tablet Take 1 tablet (40 mg total) by mouth daily. 60 tablet 6  ?  predniSONE (DELTASONE) 10 MG tablet Take 4 tablets daily for 5 days then one daily 60 tablet 3  ? pregabalin (LYRICA) 75 MG capsule Take 1 capsule (75 mg total) by mouth 2 (two) times daily. 60 capsule 1  ? Respiratory Therapy Supplies (FLUTTER) DEVI Use 4 times daily 1 each 0  ? ?No current facility-administered medications for this visit.  ?  ? ?Review of Systems  ?  ?He denies chest pain, palpitations, pnd, orthopnea, n, v, dizziness, syncope, edema, weight gain, or early satiety. All other systems reviewed and are otherwise negative except as noted above.  ? ?Physical Exam  ?  ?VS:  BP 120/68   Pulse 90   Ht 5' 9.5" (1.765 m)   Wt 197 lb (89.4 kg)   SpO2 97%   BMI 28.67 kg/m?   ?GEN: Well nourished, well developed, in no acute distress. ?HEENT: normal. ?Neck: Supple, no JVD, carotid bruits, or masses. ?Cardiac: RRR, no murmurs, rubs, or gallops. No clubbing, cyanosis, edema.  Radials/DP/PT 2+ and equal bilaterally.  ?Respiratory:  Respirations regular and unlabored, clear to auscultation bilaterally. ?GI: Soft, nontender, nondistended, BS + x 4. ?MS: no deformity or atrophy. ?Skin: warm and dry, no rash. ?Neuro:  Strength and sensation are intact. ?Psych: Normal affect. ? ?Accessory Clinical Findings  ?  ?ECG personally reviewed by me today -NSR, 90 bpm- no acute changes. ? ?Lab Results  ?Component Value Date  ? WBC 9.1 03/10/2021  ? HGB 14.2 03/10/2021  ? HCT 42.3 03/10/2021  ? MCV 90 03/10/2021  ? PLT 328 03/10/2021  ? ?Lab Results  ?Component Value Date  ? CREATININE 0.91 03/10/2021  ? BUN 11 03/10/2021  ? NA 144 03/10/2021  ? K 3.8 03/10/2021  ? CL 103 03/10/2021  ? CO2 26 03/10/2021  ? ?Lab Results  ?Component Value Date  ? ALT 30 02/20/2021  ? AST 24 02/20/2021  ? GGT 36 06/23/2016  ? ALKPHOS 98 02/20/2021  ? BILITOT 0.4 02/20/2021  ? ?Lab Results  ?Component Value Date  ? TRIG 95 10/21/2019  ?  ?Lab Results  ?Component Value Date  ? HGBA1C 6.5 12/22/2020  ? ? ?Assessment & Plan  ?  ?1. Hypertension:  BP well controlled. Continue current antihypertensive regimen.  ? ?2.  Sinus tachycardia/CAP: EKG during recent hospitalization showed tachycardia in the setting of severe sepsis secondary to CAP.  Denies palpitations.  EKG today shows NSR, 90 bpm. Stable.  Continue metoprolol.  Repeat chest x-ray pending per PCP. ? ?3. Mild dilation of aortic root: Echocardiogram in 2021 showed mild dilation of aortic root, 41 mm. Not appreciated on most recent CT chest in 04/2020.  Will repeat CTA chest/aorta for routine monitoring.  ? ?4. COPD/chronic dyspnea: Echocardiogram showed EF 60 to 65%, no RWMA, moderate LVH.  He has stable chronic dyspnea on exertion.  No symptoms concerning for angina.  No indication for ischemic evaluation at this time.  ? ?5. EtOH use/tobacco use: Denies recent alcohol use.  He does use chewing tobacco.  Full cessation advised. ? ?6. Type 2 diabetes: A1C was 6.5 in 12/2020.  Monitored and managed per PCP. ? ?7. Disposition: F/u in 1 year. ? ?Lenna Sciara, NP ?03/31/2021, 3:48 PM ?  ?

## 2021-03-31 NOTE — Patient Instructions (Addendum)
Medication Instructions:  ?Your physician recommends that you continue on your current medications as directed. Please refer to the Current Medication list given to you today.  ? ?*If you need a refill on your cardiac medications before your next appointment, please call your pharmacy* ? ? ?Lab Work: ?NONE ordered at this time of appointment  ? ?If you have labs (blood work) drawn today and your tests are completely normal, you will receive your results only by: ?MyChart Message (if you have MyChart) OR ?A paper copy in the mail ?If you have any lab test that is abnormal or we need to change your treatment, we will call you to review the results. ? ? ?Testing/Procedures: ?Non-Cardiac CT Angiography (CTA), is a special type of CT scan that uses a computer to produce multi-dimensional views of major blood vessels throughout the body. In CT angiography, a contrast material is injected through an IV to help visualize the blood vessels  ? ? ?Follow-Up: ?At [facility], you and your health needs are our priority.  As part of our continuing mission to provide you with exceptional heart care, we have created designated Provider Care Teams.  These Care Teams include your primary Cardiologist (physician) and Advanced Practice Providers (APPs -  Physician Assistants and Nurse Practitioners) who all work together to provide you with the care you need, when you need it. ? ?We recommend signing up for the patient portal called "MyChart".  Sign up information is provided on this After Visit Summary.  MyChart is used to connect with patients for Virtual Visits (Telemedicine).  Patients are able to view lab/test results, encounter notes, upcoming appointments, etc.  Non-urgent messages can be sent to your provider as well.   ?To learn more about what you can do with MyChart, go to [email or web address].   ? ?Your next appointment:   ?1 year(s) ? ?The format for your next appointment:   ?In Person ? ?Provider:   ?Evalina Field, MD   ? ? ? ? ?

## 2021-04-04 DIAGNOSIS — F79 Unspecified intellectual disabilities: Secondary | ICD-10-CM | POA: Diagnosis not present

## 2021-04-04 DIAGNOSIS — J9621 Acute and chronic respiratory failure with hypoxia: Secondary | ICD-10-CM | POA: Diagnosis not present

## 2021-04-04 DIAGNOSIS — J9601 Acute respiratory failure with hypoxia: Secondary | ICD-10-CM | POA: Diagnosis not present

## 2021-04-07 ENCOUNTER — Other Ambulatory Visit: Payer: Self-pay

## 2021-04-09 ENCOUNTER — Other Ambulatory Visit (HOSPITAL_BASED_OUTPATIENT_CLINIC_OR_DEPARTMENT_OTHER): Payer: Self-pay

## 2021-04-15 ENCOUNTER — Other Ambulatory Visit: Payer: Self-pay | Admitting: *Deleted

## 2021-04-15 NOTE — Patient Instructions (Signed)
Visit Information ? ?James [name] was given information about Medicaid Managed Care team care coordination services as a part of their Healthy [facility] Medicaid benefit. [name] verbally consented to engagement with the [facility] Managed Care team.  ? ?If you are experiencing a medical emergency, please call 911 or report to your local emergency department or urgent care.  ? ?If you have a non-emergency medical problem during routine business hours, please contact your provider's office and ask to speak with a nurse.  ? ?For questions related to your Healthy [facility] health plan, please call: [phone] or visit the homepage here: [email or web address] ? ?If you would like to schedule transportation through your Healthy [facility] plan, please call the following number at least 2 days in advance of your appointment: [phone] ? For information about your ride after you set it up, call Ride Assist at [phone]. Use this number to activate a Will Call pickup, or if your transportation is late for a scheduled pickup. Use this number, too, if you need to make a change or cancel a previously scheduled reservation. ? If you need transportation services right away, call [phone]. The after-hours call center is staffed 24 hours to handle ride assistance and urgent reservation requests (including discharges) 365 days a year. Urgent trips include sick visits, hospital discharge requests and life-sustaining treatment. ? ?Call the [place] at [phone], at any time, 24 hours a day, 7 days a week. If you are in danger or need immediate medical attention call 911. ? ?If you would like help to quit smoking, call 1-800-QUIT-NOW [phone]) OR Espa?ol: 1-855-D?jelo-Ya (234) 254-1400) o para m?s informaci?n haga clic aqu? or Text READY to 200-400 to register via text ? ?James [name], ? ? ?Please see education materials related to COPD provided as print  materials.  ? ?The patient verbalized understanding of instructions, educational materials, and care plan provided today and agreed to receive a mailed copy of patient instructions, educational materials, and care plan.  ? ?Telephone follow up appointment with Managed Medicaid care management team member scheduled for:05/15/21 @ 10:30am ? ?James Joiner RN, BSN ?[place] ?RN Care Coordinator ? ? ?Following is a copy of your plan of care:  ?Care Plan : [place] of Care  ?Updates made by [name], RN since 04/15/2021 12:00 AM  ?  ? ?Problem: Chronic Disease Management needs in patient with COPD   ?Priority: High  ?  ? ?Long-Range Goal: Plan of care established for for Chronic Disease management for patient with COPD   ?Start Date: 11/13/2020  ?Expected End Date: 05/15/2021  ?Priority: High  ?Note:   ?Current Barriers:  ?Care Coordination needs related to Limited access to food  ?Chronic Disease Management support and education needs related to COPD ?James [name] attended Cardiology on 03/31/21. Will need follow up in one year. James [name] reports his breathing is "bad". Follow up with [name] on 04/29/21.  James [name] needed assistance arranging transportation. Reports having and taking all prescribed medications. ? ?RNCM Clinical Goal(s):  ?Patient will verbalize understanding of plan for management of COPD as evidenced by taking all medications as prescribed, attending all scheduled appointments and contacting provider with questions and concerns ?take all medications exactly as prescribed and will call provider for medication related questions as evidenced by refilling medications and taking as prescribed    ?attend all scheduled medical appointments: 03/31/21 at 2:45pm with Cardiology as evidenced by provider notes in EMR        ?  demonstrate improved adherence to prescribed treatment plan for COPD as evidenced by taking all medications as prescribed, attending all scheduled  appointments and contacting provider with questions and concerns ?continue to work with RN Care Manager and/or Social Worker to address care management and care coordination needs related to COPD as evidenced by adherence to CM Team Scheduled appointments     through collaboration with Consulting civil engineer, provider, and care team.  ? ?Interventions: ?Inter-disciplinary care team collaboration (see longitudinal plan of care) ?Evaluation of current treatment plan related to  self management and patient's adherence to plan as established by provider ? ? ? ?SDOH Barriers (Status:  Goal on track:  Yes. ?Patient interviewed and SDOH assessment performed ?       ?SDOH Interventions   ? ?Flowsheet Row Most Recent Value  ?SDOH Interventions   ?Transportation Interventions Other (Comment)  [Assisted with arranging transportation via [place]  ? ?  ?Patient interviewed and appropriate assessments performed ?Discussed plans with patient for ongoing care management follow up and provided patient with direct contact information for care management team- ?RNCM assisted with arranging transportation provided by [facility], with [employer] [phone], to follow up with [name]. Transportation will pick up at 9:45am and return pick up scheduled for 12:30pm, [phone] ?Encouraged patient to check medications and request needed refills prior to his appointment and pick up any needed medications on 04/29/21 ? ? ?COPD: (Status: Goal on Track (progressing): YES.) Long Term Goal  ?Reviewed medications with patient, including use of prescribed maintenance and rescue inhalers, and provided instruction on medication management and the importance of adherence ?Advised patient to use nebulizer as directed, three times daily ?Provided instruction about proper use of medications used for management of COPD including inhalers ?Provided education about and advised patient to utilize infection prevention strategies to reduce risk of respiratory  infection ?Discussed the importance of adequate rest and management of fatigue with COPD ?Assessed social determinant of health barriers ?Advised patient to limit time outside with increased pollen and rising temperatures ? ? ?Patient Goals/Self-Care Activities: ?Take medications as prescribed   ?Attend all scheduled provider appointments ?Call pharmacy for medication refills 3-7 days in advance of running out of medications ?Perform all self care activities independently  ?Call provider office for new concerns or questions  ?- avoid second hand smoke ?- limit outdoor activity during cold weather ?- keep follow-up appointments: 04/29/21 with [name] ?- eat healthy/prescribed diet: DASH ?- get at least 7 to 8 hours of sleep at night ? ? ?  ? ?  ?

## 2021-04-15 NOTE — Patient Outreach (Signed)
?Medicaid Managed Care   ?Nurse Care Manager Note ? ?04/15/2021 ?Name:  James [name] MRN:  [id number] DOB:  07-Sep-1960 ? ?James [name] is an 61 y.o. year old male who is a primary patient of James [name], James Harry, [name].  The [facility] Managed Care Coordination team was consulted for assistance with:    ?COPD ? ?[name] was given information about Medicaid Managed Care Coordination team services today. James [name] Patient agreed to services and verbal consent obtained. ? ?Engaged with patient by telephone for follow up visit in response to provider referral for case management and/or care coordination services.  ? ?Assessments/Interventions:  Review of past medical history, allergies, medications, health status, including review of consultants reports, laboratory and other test data, was performed as part of comprehensive evaluation and provision of chronic care management services. ? ?SDOH (Social Determinants of Health) assessments and interventions performed: ?SDOH Interventions   ? ?Flowsheet Row Most Recent Value  ?SDOH Interventions   ?Transportation Interventions Other (Comment)  [Assisted with arranging transportation via James [name]  ? ?  ? ? ?Care Plan ? ?No Known Allergies ? ?Medications Reviewed Today   ? ? Reviewed by James Montane, RN (Registered Nurse) on 04/15/21 at [place] List Status: <None>  ? ?Medication Order Taking? Sig Documenting Provider Last Dose Status Informant  ?albuterol (PROVENTIL) (2.5 MG/3ML) 0.083% nebulizer solution [id number] Yes TAKE 3 MLS (2.5 MG TOTAL) BY NEBULIZATION IN THE MORNING, AT NOON, AND AT BEDTIME.  ?Patient taking differently: Take 2.5 mg by nebulization at bedtime.  ? James [name] Taking Active Self  ?         ?Med Note James [name], James [name]   Sat Feb 21, 2021 11:45 AM)    ?albuterol (VENTOLIN HFA) 108 (90 Base) MCG/ACT inhaler [id number] Yes Inhale 2 puffs into the lungs every 4 (four) hours as needed. James [name] Taking Active Self   ?Fluticasone-Umeclidin-Vilant (TRELEGY ELLIPTA) 100-62.5-25 MCG/ACT AEPB [id number] Yes Inhale 1 puff into the lungs daily. James [name] Taking Active   ?furosemide (LASIX) 20 MG tablet [id number] Yes Take 1 tablet (20 mg total) by mouth daily as needed for edema. James [name] Taking Active   ?glipiZIDE (GLUCOTROL XL) 5 MG 24 hr tablet [id number] Yes Take 1 tablet (5 mg total) by mouth daily with breakfast. James [name] Taking Active   ?losartan (COZAAR) 50 MG tablet [id number] Yes Take 1 tablet (50 mg total) by mouth daily. James [name] Taking Active   ?metoprolol succinate (TOPROL-XL) 25 MG 24 hr tablet [id number] Yes Take 1 tablet (25 mg total) by mouth daily. James [name] Taking Active   ?pantoprazole (PROTONIX) 40 MG tablet [id number] Yes Take 1 tablet (40 mg total) by mouth daily. James [name] Taking Active   ?predniSONE (DELTASONE) 10 MG tablet [id number] Yes Take 4 tablets daily for 5 days then one daily James [name] Taking Active   ?         ?Med Note James [name])    ?pregabalin (LYRICA) 75 MG capsule [id number] Yes Take 1 capsule (75 mg total) by mouth 2 (two) times daily. James [name] Taking Active   ?         ?Med Note James [name])    ?Respiratory Therapy Supplies (FLUTTER) DEVI [id number] Yes Use 4  times daily James [name] Taking Active Self  ? ?  ?  ? ?  ? ? ?Patient Active Problem List  ? Diagnosis Date Noted  ? HCAP (healthcare-associated pneumonia) 02/20/2021  ? COPD with chronic bronchitis ([place]) 02/01/2021  ? GERD (gastroesophageal reflux disease) 02/01/2021  ? Neuropathy 09/29/2020  ? Bronchiectasis ([place]) 09/24/2019  ? Type 2 diabetes mellitus without complication, without long-term current use of insulin ([place]) 07/24/2019  ? Other atopic dermatitis 05/01/2019  ? Chronic respiratory failure with hypoxia ([place]) 04/23/2019  ? Intellectual disability  04/23/2019  ? History of alcohol use 11/23/2018  ? Hypertension 05/17/2016  ? History of tobacco abuse 04/06/2016  ? COPD mixed type ([place]) 03/26/2016  ? ? ?Conditions to be addressed/monitored per PCP order:  COPD ? ?Care Plan : RN Care Manager Plan of Care  ?Updates made by James Montane, RN since 04/15/2021 12:00 AM  ?  ? ?Problem: Chronic Disease Management needs in patient with COPD   ?Priority: High  ?  ? ?Long-Range Goal: Plan of care established for for Chronic Disease management for patient with COPD   ?Start Date: 11/13/2020  ?Expected End Date: 05/15/2021  ?Priority: High  ?Note:   ?Current Barriers:  ?Care Coordination needs related to Limited access to food  ?Chronic Disease Management support and education needs related to COPD ?James [name] attended Cardiology on 03/31/21. Will need follow up in one year. James [name] reports his breathing is "bad". Follow up with Dr. Joya [name] on 04/29/21.  James [name] needed assistance arranging transportation. Reports having and taking all prescribed medications. ? ?RNCM Clinical Goal(s):  ?Patient will verbalize understanding of plan for management of COPD as evidenced by taking all medications as prescribed, attending all scheduled appointments and contacting provider with questions and concerns ?take all medications exactly as prescribed and will call provider for medication related questions as evidenced by refilling medications and taking as prescribed    ?attend all scheduled medical appointments: 03/31/21 at 2:45pm with Cardiology as evidenced by provider notes in EMR        ?demonstrate improved adherence to prescribed treatment plan for COPD as evidenced by taking all medications as prescribed, attending all scheduled appointments and contacting provider with questions and concerns ?continue to work with RN Care Manager and/or Social Worker to address care management and care coordination needs related to COPD as evidenced by adherence to CM Team Scheduled  appointments     through collaboration with Consulting civil engineer, provider, and care team.  ? ?Interventions: ?Inter-disciplinary care team collaboration (see longitudinal plan of care) ?Evaluation of current treatment plan related to  self management and patient's adherence to plan as established by provider ? ? ? ?SDOH Barriers (Status:  Goal on track:  Yes. ?Patient interviewed and SDOH assessment performed ?       ?SDOH Interventions   ? ?Flowsheet Row Most Recent Value  ?SDOH Interventions   ?Transportation Interventions Other (Comment)  [Assisted with arranging transportation via [place]  ? ?  ?Patient interviewed and appropriate assessments performed ?Discussed plans with patient for ongoing care management follow up and provided patient with direct contact information for care management team- ?RNCM assisted with arranging transportation provided by [facility], with [employer] [phone], to follow up with Dr. Joya [name]. Transportation will pick up at 9:45am and return pick up scheduled for 12:30pm, [phone] ?Encouraged patient to check medications and request needed refills prior to his appointment and pick up any needed medications on 04/29/21 ? ? ?COPD: (Status:  Goal on Track (progressing): YES.) Long Term Goal  ?Reviewed medications with patient, including use of prescribed maintenance and rescue inhalers, and provided instruction on medication management and the importance of adherence ?Advised patient to use nebulizer as directed, three times daily ?Provided instruction about proper use of medications used for management of COPD including inhalers ?Provided education about and advised patient to utilize infection prevention strategies to reduce risk of respiratory infection ?Discussed the importance of adequate rest and management of fatigue with COPD ?Assessed social determinant of health barriers ?Advised patient to limit time outside with increased pollen and rising temperatures ? ? ?Patient  Goals/Self-Care Activities: ?Take medications as prescribed   ?Attend all scheduled provider appointments ?Call pharmacy for medication refills 3-7 days in advance of running out of medications ?Perform all self care activities independentl

## 2021-04-28 NOTE — Progress Notes (Signed)
? ?Established Patient Office Visit ? ?Subjective:  ?Patient ID: James [name], male    DOB: 1960-10-04  Age: 60 y.o. MRN: [id number] ? ?CC:  ?No chief complaint on file. ? ? ?HPI ?03/10/21 ?James [name] presents for posthospital visit.  Note this patient was discharged in the hospital greater than 2 weeks ago so this is not a transition of care visit. ? ?Patient was readmitted on 17 February after he fell in his workshop injuring his left ribs developed increased shortness of breath cough found to have an HCAP pneumonia and persistent COVID.  He is unvaccinated.  He also has steadfastly refused pneumonia vaccinations previously. ? ?Patient is off all diabetic medications on arrival blood sugar is 147. ? ?He was supposed to have 5 days of prednisone and Levaquin on discharge he never had these prescriptions filled.  He has difficulty with transportation.  His truck is been taken from him that he was using for transportation. ? ?He is still living on the same property lived before and his landlord and employer is late letting him stay in the house but did take his truck. ? ?Patient is still using the Trelegy daily.  Maintains losartan metoprolol pantoprazole.  He also takes furosemide if needed.  He is wishing refills on his medications. ? ?Below is a copy of the discharge summary. ? ?Admit date: 02/20/2021 ?Discharge date: 02/22/2021 ?30 Day Unplanned Readmission Risk Score   ?  ?Flowsheet Row ED to Hosp-Admission (Current) from 02/20/2021 in [facility]  ?30 Day Unplanned Readmission Risk Score (%) 29.18 Filed at 02/22/2021 0801  ?  ?   ?  ?  This score is the patient's risk of an unplanned readmission within 30 days of being discharged (0 -100%). The score is based on dignosis, age, lab data, medications, orders, and past utilization.   ?Low:  0-14.9   Medium: 15-21.9   High: 22-29.9   Extreme: 30 and above ?  ?   ?  ?   ?  ?  ?  ?Admitted From: Home ?Disposition: Home ?   ?Recommendations for Outpatient Follow-up:  ?Follow up with PCP in 1-2 weeks ?Please obtain BMP/CBC in one week ?Please follow up with your PCP on the following pending results: ?Unresulted Labs (From admission, onward)  ?  ?    Start     Ordered  ?  02/20/21 1151   Expectorated Sputum Assessment w Gram Stain, Rflx to Resp Cult  (COPD / Pneumonia / Cellulitis / Lower Extremity Wound)  Once,   R       ? 02/20/21 1157  ?  02/20/21 0851   MRSA Next Gen by PCR, Nasal  Once,   STAT       ? 02/20/21 0850  ?  02/20/21 0840   Urinalysis, Routine w reflex microscopic  Once,   STAT       ? 02/20/21 0840  ?  ?   ?  ?  ?   ?  ?  ?  ?Home Health: Yes ?Equipment/Devices: None ?  ?Discharge Condition: Stable ?CODE STATUS: Full code ?Diet recommendation: Cardiac ?  ?Subjective: Seen and examined.  He feels better.  He was saturating 97% on room air during my visit.  He is willing to go home ?  ?Brief/Interim Summary: HPI: TYKWON FERA is a 61 y.o. male with medical history significant of COPD on 2-4L home O2; ETOH dependence; DM; and HTN presented with SOB. He was  most recently hospitalized from 1/29-2/1 with COPD exacerbation related to COVID-19 infection.  He was treated with IV steroids.  He was doing well until the day of admission when he started having worsening shortness of breath so he came to the ED.  Admitted to hospital service with following issues. ?  ?Severe sepsis secondary to HCAP (healthcare-associated pneumonia)/chronic hypoxic respiratory failure- (present on admission): Patient meets severe sepsis criteria based on tachypnea, tachycardia and lactic acid of 2.3.  He was started on cefepime and vancomycin.  Subsequently vancomycin was discontinued.  Patient improved significantly.  He was not having on oxygen this morning which he normally uses 24 hours at home.  He was assessed by PT OT who recommended home health.  He is being discharged in stable condition.  I have prescribed 3 more days of oral prednisone  and 5 days of oral Levaquin. ?  ?Acute COPD exacerbation: End expiratory wheezes but he is not hypoxic.  Discharging on 3 more days of oral prednisone. ?  ?Type 2 diabetes mellitus without complication, without long-term current use of insulin ([place]) ?-Last A1c was 6.5 ?-Not taking home meds ?  ?Hypertension- (present on admission) ?Resume home medications. ?  ?Discharge plan was discussed with patient and/or family member and they verbalized understanding and agreed with it.  ?Discharge Diagnoses:  ?Principal Problem: ?  HCAP (healthcare-associated pneumonia) ?Active Problems: ?  COPD mixed type ([place]) ?  Hypertension ?  Type 2 diabetes mellitus without complication, without long-term current use of insulin ([place]) ?  Severe sepsis ([place]) ?  ?04/29/2021 ?Patient returns to clinic he has not been in the hospital recently patient is out of his Trelegy needs refills he is coughing up thick yellow mucus more wheezing is noted.  No longer has transportation has to get transportation services.  On arrival A1c is 6.4 ?Past Medical History:  ?Diagnosis Date  ? Acute on chronic respiratory failure with hypoxia ([place]) 06/08/2013  ? CAP (community acquired pneumonia) 05/17/2018  ? See admit 05/17/18 ? rml  with   covid pcr neg - rx augmentin > f/u cxr in 4-6 weeks is fine unless condition declines   ? Community acquired pneumonia of right lower lobe of lung 05/17/2018  ? See admit 05/17/18 ? rml  with   covid pcr neg - rx augmentin > f/u cxr in 4-6 weeks is fine unless condition declines   ? COPD (chronic obstructive pulmonary disease) ([place])   ? not on home  ? COVID-19 virus infection 10/19/2019  ? Diabetes ([place])   ? Dyspnea   ? HCAP (healthcare-associated pneumonia) 02/20/2021  ? Hypertension   ? Pneumonia 04/06/2016  ? Pneumonia due to COVID-19 virus 10/19/2019  ? Pneumothorax   ? 2016, fell from ladder  ? 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 Tick bite 06/03/2018  ? ? ?Past Surgical History:  ?Procedure Laterality Date  ? LUNG REMOVAL,  PARTIAL    ? VIDEO ASSISTED THORACOSCOPY (VATS)/THOROCOTOMY Left 06/11/2013  ? Procedure: VIDEO ASSISTED THORACOSCOPY (VATS)/THOROCOTOMY;  Surgeon: Ivin Poot, MD;  Location: [facility];  Service: Thoracic;  Laterality: Left;  ? VIDEO BRONCHOSCOPY N/A 06/11/2013  ? Procedure: VIDEO BRONCHOSCOPY;  Surgeon: Ivin Poot, MD;  Location: [facility];  Service: Thoracic;  Laterality: N/A;  ? VIDEO BRONCHOSCOPY N/A 08/10/2018  ? Procedure: VIDEO BRONCHOSCOPY WITH FLUORO;  Surgeon: Candee Furbish, MD;  Location: [facility] ENDOSCOPY;  Service: Endoscopy;  Laterality: N/A;  ? ? ?Family History  ?Problem Relation Age of Onset  ? Heart disease  Father   ? COPD Sister   ? ? ?Social History  ? ?Socioeconomic History  ? Marital status: Divorced  ?  Spouse name: Not on file  ? Number of children: Not on file  ? Years of education: Not on file  ? Highest education level: Not on file  ?Occupational History  ? Occupation: Dealer  ? Occupation: [employer]  ?Tobacco Use  ? Smoking status: Former  ?  Packs/day: 1.50  ?  Years: 40.00  ?  Pack years: 60.00  ?  Types: Cigarettes  ?  Quit date: 2014  ?  Years since quitting: 9.3  ? Smokeless tobacco: Former  ?  Types: Chew  ?Vaping Use  ? Vaping Use: Never used  ?Substance and Sexual Activity  ? Alcohol use: Not Currently  ?  Alcohol/week: 28.0 standard drinks  ?  Types: 28 Cans of beer per week  ?  Comment: 4 beers a night previously  ? Drug use: Not Currently  ?  Types: Marijuana  ?  Comment: daily; quit using crack and methamphetamine about 5 months ago   ? Sexual activity: Not Currently  ?  Partners: Female  ?Other Topics Concern  ? Not on file  ?Social History Narrative  ? Lives alone near [place]  ? ?Social Determinants of Health  ? ?Financial Resource Strain: Low Risk   ? Difficulty of Paying Living Expenses: Not very hard  ?Food Insecurity: Food Insecurity Present  ? Worried About Charity fundraiser in the Last Year: Sometimes true  ? Ran Out of Food in the Last Year: Sometimes true   ?Transportation Needs: Unmet Transportation Needs  ? Lack of Transportation (Medical): Yes  ? Lack of Transportation (Non-Medical): No  ?Physical Activity: Inactive  ? Days of Exercise per Week: 0 days  ? Minutes o

## 2021-04-29 ENCOUNTER — Other Ambulatory Visit: Payer: Self-pay

## 2021-04-29 ENCOUNTER — Encounter: Payer: Self-pay | Admitting: Critical Care Medicine

## 2021-04-29 ENCOUNTER — Ambulatory Visit: Payer: Medicaid Other | Attending: Critical Care Medicine | Admitting: Critical Care Medicine

## 2021-04-29 VITALS — BP 125/81 | HR 81 | Temp 97.9°F | Resp 20 | Ht 69.0 in | Wt 202.0 lb

## 2021-04-29 DIAGNOSIS — J449 Chronic obstructive pulmonary disease, unspecified: Secondary | ICD-10-CM | POA: Diagnosis not present

## 2021-04-29 DIAGNOSIS — J189 Pneumonia, unspecified organism: Secondary | ICD-10-CM

## 2021-04-29 DIAGNOSIS — I10 Essential (primary) hypertension: Secondary | ICD-10-CM

## 2021-04-29 DIAGNOSIS — E119 Type 2 diabetes mellitus without complications: Secondary | ICD-10-CM | POA: Diagnosis not present

## 2021-04-29 LAB — POCT GLYCOSYLATED HEMOGLOBIN (HGB A1C): HbA1c, POC (controlled diabetic range): 6.7 % (ref 0.0–7.0)

## 2021-04-29 LAB — GLUCOSE, POCT (MANUAL RESULT ENTRY): POC Glucose: 134 mg/dl — AB (ref 70–99)

## 2021-04-29 MED ORDER — TRELEGY ELLIPTA 100-62.5-25 MCG/ACT IN AEPB
1.0000 | INHALATION_SPRAY | Freq: Every day | RESPIRATORY_TRACT | 2 refills | Status: DC
  Filled 2021-06-09: qty 60, 30d supply, fill #0

## 2021-04-29 MED ORDER — FUROSEMIDE 20 MG PO TABS
20.0000 mg | ORAL_TABLET | Freq: Every day | ORAL | 2 refills | Status: DC | PRN
  Filled 2021-04-29 – 2021-04-30 (×2): qty 30, 30d supply, fill #0
  Filled 2021-06-09: qty 30, 30d supply, fill #1
  Filled 2021-07-08: qty 30, 30d supply, fill #0

## 2021-04-29 MED ORDER — PREDNISONE 10 MG PO TABS
ORAL_TABLET | ORAL | 3 refills | Status: DC
  Filled 2021-04-29: qty 45, 30d supply, fill #0
  Filled 2021-04-30: qty 60, 34d supply, fill #0

## 2021-04-29 MED ORDER — CEFDINIR 300 MG PO CAPS
300.0000 mg | ORAL_CAPSULE | Freq: Two times a day (BID) | ORAL | 0 refills | Status: DC
  Filled 2021-04-29: qty 14, 7d supply, fill #0

## 2021-04-29 MED ORDER — GLIPIZIDE ER 5 MG PO TB24
5.0000 mg | ORAL_TABLET | Freq: Every day | ORAL | 1 refills | Status: DC
  Filled 2021-04-29 – 2021-06-09 (×2): qty 60, 60d supply, fill #0

## 2021-04-29 MED ORDER — PANTOPRAZOLE SODIUM 40 MG PO TBEC
40.0000 mg | DELAYED_RELEASE_TABLET | Freq: Every day | ORAL | 6 refills | Status: DC
  Filled 2021-06-22: qty 60, 60d supply, fill #0

## 2021-04-29 NOTE — Assessment & Plan Note (Signed)
Pneumonia has resolved ?

## 2021-04-29 NOTE — Assessment & Plan Note (Signed)
Blood pressure at goal no changes ?

## 2021-04-29 NOTE — Progress Notes (Signed)
F/u SOB ?

## 2021-04-29 NOTE — Patient Instructions (Signed)
Go to pharmacy now pick up your refills ? ?On prednisone take 4 a day for 5 days then down to 1 a day and stay ? ?Take cefdinir twice daily as an antibiotic for 7 days ? ?Return to see [name] 2 months ? ? ?

## 2021-04-29 NOTE — Assessment & Plan Note (Signed)
Continue glipizide ?

## 2021-04-29 NOTE — Assessment & Plan Note (Signed)
Recurrent COPD exacerbation we will give short course of cefdinir and pulse prednisone continue inhalers refill Trelegy ?

## 2021-04-30 ENCOUNTER — Other Ambulatory Visit: Payer: Self-pay

## 2021-05-05 ENCOUNTER — Other Ambulatory Visit: Payer: Self-pay

## 2021-05-07 ENCOUNTER — Emergency Department (HOSPITAL_COMMUNITY): Payer: Medicaid Other

## 2021-05-07 ENCOUNTER — Encounter (HOSPITAL_COMMUNITY): Payer: Self-pay | Admitting: Emergency Medicine

## 2021-05-07 ENCOUNTER — Inpatient Hospital Stay (HOSPITAL_COMMUNITY)
Admission: EM | Admit: 2021-05-07 | Discharge: 2021-05-10 | DRG: 871 | Disposition: A | Discharge status: Home / Self Care | Payer: Medicaid Other | Attending: Internal Medicine | Admitting: Internal Medicine

## 2021-05-07 DIAGNOSIS — E8729 Other acidosis: Secondary | ICD-10-CM | POA: Diagnosis present

## 2021-05-07 DIAGNOSIS — E119 Type 2 diabetes mellitus without complications: Secondary | ICD-10-CM | POA: Diagnosis not present

## 2021-05-07 DIAGNOSIS — J441 Chronic obstructive pulmonary disease with (acute) exacerbation: Secondary | ICD-10-CM | POA: Diagnosis not present

## 2021-05-07 DIAGNOSIS — J440 Chronic obstructive pulmonary disease with acute lower respiratory infection: Secondary | ICD-10-CM | POA: Diagnosis present

## 2021-05-07 DIAGNOSIS — A419 Sepsis, unspecified organism: Secondary | ICD-10-CM | POA: Diagnosis not present

## 2021-05-07 DIAGNOSIS — J189 Pneumonia, unspecified organism: Secondary | ICD-10-CM | POA: Diagnosis not present

## 2021-05-07 DIAGNOSIS — Z87891 Personal history of nicotine dependence: Secondary | ICD-10-CM

## 2021-05-07 DIAGNOSIS — J9620 Acute and chronic respiratory failure, unspecified whether with hypoxia or hypercapnia: Secondary | ICD-10-CM | POA: Diagnosis not present

## 2021-05-07 DIAGNOSIS — R0689 Other abnormalities of breathing: Secondary | ICD-10-CM | POA: Diagnosis not present

## 2021-05-07 DIAGNOSIS — J9602 Acute respiratory failure with hypercapnia: Secondary | ICD-10-CM

## 2021-05-07 DIAGNOSIS — Z8616 Personal history of COVID-19: Secondary | ICD-10-CM | POA: Diagnosis not present

## 2021-05-07 DIAGNOSIS — Z7984 Long term (current) use of oral hypoglycemic drugs: Secondary | ICD-10-CM | POA: Diagnosis not present

## 2021-05-07 DIAGNOSIS — Z8701 Personal history of pneumonia (recurrent): Secondary | ICD-10-CM

## 2021-05-07 DIAGNOSIS — J80 Acute respiratory distress syndrome: Secondary | ICD-10-CM | POA: Diagnosis not present

## 2021-05-07 DIAGNOSIS — K219 Gastro-esophageal reflux disease without esophagitis: Secondary | ICD-10-CM | POA: Diagnosis not present

## 2021-05-07 DIAGNOSIS — R0603 Acute respiratory distress: Secondary | ICD-10-CM | POA: Diagnosis not present

## 2021-05-07 DIAGNOSIS — Z7951 Long term (current) use of inhaled steroids: Secondary | ICD-10-CM

## 2021-05-07 DIAGNOSIS — J9622 Acute and chronic respiratory failure with hypercapnia: Secondary | ICD-10-CM | POA: Diagnosis not present

## 2021-05-07 DIAGNOSIS — Z79899 Other long term (current) drug therapy: Secondary | ICD-10-CM | POA: Diagnosis not present

## 2021-05-07 DIAGNOSIS — Z8249 Family history of ischemic heart disease and other diseases of the circulatory system: Secondary | ICD-10-CM | POA: Diagnosis not present

## 2021-05-07 DIAGNOSIS — J9621 Acute and chronic respiratory failure with hypoxia: Secondary | ICD-10-CM | POA: Diagnosis not present

## 2021-05-07 DIAGNOSIS — Z833 Family history of diabetes mellitus: Secondary | ICD-10-CM | POA: Diagnosis not present

## 2021-05-07 DIAGNOSIS — D849 Immunodeficiency, unspecified: Secondary | ICD-10-CM | POA: Diagnosis not present

## 2021-05-07 DIAGNOSIS — R062 Wheezing: Secondary | ICD-10-CM | POA: Diagnosis not present

## 2021-05-07 DIAGNOSIS — Z7952 Long term (current) use of systemic steroids: Secondary | ICD-10-CM | POA: Diagnosis not present

## 2021-05-07 DIAGNOSIS — R0602 Shortness of breath: Secondary | ICD-10-CM | POA: Diagnosis not present

## 2021-05-07 DIAGNOSIS — R6520 Severe sepsis without septic shock: Principal | ICD-10-CM

## 2021-05-07 DIAGNOSIS — R000 Tachycardia, unspecified: Secondary | ICD-10-CM | POA: Diagnosis not present

## 2021-05-07 DIAGNOSIS — I10 Essential (primary) hypertension: Secondary | ICD-10-CM | POA: Diagnosis present

## 2021-05-07 DIAGNOSIS — R0682 Tachypnea, not elsewhere classified: Secondary | ICD-10-CM | POA: Diagnosis not present

## 2021-05-07 DIAGNOSIS — Z9981 Dependence on supplemental oxygen: Secondary | ICD-10-CM | POA: Diagnosis not present

## 2021-05-07 LAB — CBC WITH DIFFERENTIAL/PLATELET
Abs Immature Granulocytes: 0.06 10*3/uL (ref 0.00–0.07)
Basophils Absolute: 0.1 10*3/uL (ref 0.0–0.1)
Basophils Relative: 0 %
Eosinophils Absolute: 0.1 10*3/uL (ref 0.0–0.5)
Eosinophils Relative: 1 %
HCT: 45.7 % (ref 39.0–52.0)
Hemoglobin: 14.4 g/dL (ref 13.0–17.0)
Immature Granulocytes: 0 %
Lymphocytes Relative: 11 %
Lymphs Abs: 1.9 10*3/uL (ref 0.7–4.0)
MCH: 30.1 pg (ref 26.0–34.0)
MCHC: 31.5 g/dL (ref 30.0–36.0)
MCV: 95.4 fL (ref 80.0–100.0)
Monocytes Absolute: 1 10*3/uL (ref 0.1–1.0)
Monocytes Relative: 6 %
Neutro Abs: 15.2 10*3/uL — ABNORMAL HIGH (ref 1.7–7.7)
Neutrophils Relative %: 82 %
Platelets: 327 10*3/uL (ref 150–400)
RBC: 4.79 MIL/uL (ref 4.22–5.81)
RDW: 13.7 % (ref 11.5–15.5)
WBC: 18.3 10*3/uL — ABNORMAL HIGH (ref 4.0–10.5)
nRBC: 0 % (ref 0.0–0.2)

## 2021-05-07 LAB — COMPREHENSIVE METABOLIC PANEL
ALT: 22 U/L (ref 0–44)
AST: 23 U/L (ref 15–41)
Albumin: 3.7 g/dL (ref 3.5–5.0)
Alkaline Phosphatase: 67 U/L (ref 38–126)
Anion gap: 10 (ref 5–15)
BUN: 14 mg/dL (ref 6–20)
CO2: 28 mmol/L (ref 22–32)
Calcium: 9.1 mg/dL (ref 8.9–10.3)
Chloride: 104 mmol/L (ref 98–111)
Creatinine, Ser: 0.88 mg/dL (ref 0.61–1.24)
GFR, Estimated: >60 mL/min (ref >60)
Glucose, Bld: 147 mg/dL — ABNORMAL HIGH (ref 70–99)
Potassium: 4.3 mmol/L (ref 3.5–5.1)
Sodium: 142 mmol/L (ref 135–145)
Total Bilirubin: 0.6 mg/dL (ref 0.3–1.2)
Total Protein: 6.1 g/dL — ABNORMAL LOW (ref 6.5–8.1)

## 2021-05-07 LAB — URINALYSIS, ROUTINE W REFLEX MICROSCOPIC
Bilirubin Urine: NEGATIVE
Glucose, UA: >=500 mg/dL — AB
Hgb urine dipstick: NEGATIVE
Ketones, ur: 5 mg/dL — AB
Leukocytes,Ua: NEGATIVE
Nitrite: NEGATIVE
Protein, ur: NEGATIVE mg/dL
Specific Gravity, Urine: 1.011 (ref 1.005–1.030)
pH: 6 (ref 5.0–8.0)

## 2021-05-07 LAB — RESPIRATORY PANEL BY PCR

## 2021-05-07 LAB — RESP PANEL BY RT-PCR (FLU A&B, COVID) ARPGX2
Influenza A by PCR: NEGATIVE
Influenza B by PCR: NEGATIVE
SARS Coronavirus 2 by RT PCR: NEGATIVE

## 2021-05-07 LAB — PROTIME-INR
INR: 0.9 (ref 0.8–1.2)
Prothrombin Time: 11.9 seconds (ref 11.4–15.2)

## 2021-05-07 LAB — I-STAT ARTERIAL BLOOD GAS, ED
Acid-Base Excess: 1 mmol/L (ref 0.0–2.0)
Bicarbonate: 27.4 mmol/L (ref 20.0–28.0)
Calcium, Ion: 1.22 mmol/L (ref 1.15–1.40)
HCT: 39 % (ref 39.0–52.0)
Hemoglobin: 13.3 g/dL (ref 13.0–17.0)
O2 Saturation: 99 %
Patient temperature: 101
Potassium: 3.7 mmol/L (ref 3.5–5.1)
Sodium: 141 mmol/L (ref 135–145)
TCO2: 29 mmol/L (ref 22–32)
pCO2 arterial: 52.7 mmHg — ABNORMAL HIGH (ref 32–48)
pH, Arterial: 7.33 — ABNORMAL LOW (ref 7.35–7.45)
pO2, Arterial: 145 mmHg — ABNORMAL HIGH (ref 83–108)

## 2021-05-07 LAB — CBG MONITORING, ED
Glucose-Capillary: 205 mg/dL — ABNORMAL HIGH (ref 70–99)
Glucose-Capillary: 161 mg/dL — ABNORMAL HIGH (ref 70–99)

## 2021-05-07 LAB — BRAIN NATRIURETIC PEPTIDE: B Natriuretic Peptide: 48 pg/mL (ref 0.0–100.0)

## 2021-05-07 LAB — PROCALCITONIN: Procalcitonin: 2.08 ng/mL

## 2021-05-07 LAB — APTT: aPTT: 23 seconds — ABNORMAL LOW (ref 24–36)

## 2021-05-07 LAB — TROPONIN I (HIGH SENSITIVITY)
Troponin I (High Sensitivity): 19 ng/L — ABNORMAL HIGH (ref <18)
Troponin I (High Sensitivity): 21 ng/L — ABNORMAL HIGH (ref <18)

## 2021-05-07 LAB — TSH: TSH: 0.231 u[IU]/mL — ABNORMAL LOW (ref 0.350–4.500)

## 2021-05-07 LAB — LACTIC ACID, PLASMA
Lactic Acid, Venous: 2.3 mmol/L (ref 0.5–1.9)
Lactic Acid, Venous: 1.7 mmol/L (ref 0.5–1.9)

## 2021-05-07 MED ORDER — SODIUM CHLORIDE 0.9 % IV SOLN
500.0000 mg | Freq: Once | INTRAVENOUS | Status: AC
  Administered 2021-05-07: 500 mg via INTRAVENOUS
  Filled 2021-05-07: qty 5

## 2021-05-07 MED ORDER — GUAIFENESIN 100 MG/5ML PO LIQD
5.0000 mL | ORAL | Status: DC | PRN
  Filled 2021-05-07: qty 5

## 2021-05-07 MED ORDER — PREGABALIN 75 MG PO CAPS
75.0000 mg | ORAL_CAPSULE | Freq: Two times a day (BID) | ORAL | Status: DC
  Administered 2021-05-08 – 2021-05-10 (×6): 75 mg via ORAL
  Filled 2021-05-07 (×6): qty 1

## 2021-05-07 MED ORDER — ACETAMINOPHEN 325 MG PO TABS: 650.0000 mg | ORAL_TABLET | Freq: Four times a day (QID) | ORAL | Status: DC | PRN

## 2021-05-07 MED ORDER — LOSARTAN POTASSIUM 50 MG PO TABS: 50.0000 mg | ORAL_TABLET | Freq: Every day | ORAL | Status: DC

## 2021-05-07 MED ORDER — ACETAMINOPHEN 650 MG RE SUPP
650.0000 mg | Freq: Once | RECTAL | Status: AC
Start: 2021-05-07 — End: 2021-05-07
  Administered 2021-05-07: 650 mg via RECTAL
  Filled 2021-05-07: qty 1

## 2021-05-07 MED ORDER — LACTATED RINGERS IV BOLUS
1000.0000 mL | Freq: Once | INTRAVENOUS | Status: AC
  Administered 2021-05-07: 1000 mL via INTRAVENOUS

## 2021-05-07 MED ORDER — INSULIN ASPART 100 UNIT/ML IJ SOLN
0.0000 [IU] | Freq: Three times a day (TID) | INTRAMUSCULAR | Status: DC
  Administered 2021-05-07: 5 [IU] via SUBCUTANEOUS
  Administered 2021-05-08 – 2021-05-09 (×3): 2 [IU] via SUBCUTANEOUS
  Administered 2021-05-09 – 2021-05-10 (×3): 3 [IU] via SUBCUTANEOUS

## 2021-05-07 MED ORDER — ALBUTEROL SULFATE (2.5 MG/3ML) 0.083% IN NEBU
2.5000 mg | INHALATION_SOLUTION | RESPIRATORY_TRACT | Status: DC | PRN
  Administered 2021-05-10: 2.5 mg via RESPIRATORY_TRACT
  Filled 2021-05-07: qty 3

## 2021-05-07 MED ORDER — ONDANSETRON HCL 4 MG PO TABS: 4.0000 mg | ORAL_TABLET | Freq: Four times a day (QID) | ORAL | Status: DC | PRN

## 2021-05-07 MED ORDER — ALBUTEROL SULFATE (2.5 MG/3ML) 0.083% IN NEBU
10.0000 mg/h | INHALATION_SOLUTION | Freq: Once | RESPIRATORY_TRACT | Status: AC
  Administered 2021-05-07: 10 mg/h via RESPIRATORY_TRACT
  Filled 2021-05-07: qty 12

## 2021-05-07 MED ORDER — FLUTICASONE FUROATE-VILANTEROL 100-25 MCG/ACT IN AEPB
1.0000 | INHALATION_SPRAY | Freq: Every day | RESPIRATORY_TRACT | Status: DC
Start: 2021-05-07 — End: 2021-05-10
  Administered 2021-05-08 – 2021-05-10 (×3): 1 via RESPIRATORY_TRACT
  Filled 2021-05-07 (×2): qty 28

## 2021-05-07 MED ORDER — GLIPIZIDE ER 5 MG PO TB24
5.0000 mg | ORAL_TABLET | Freq: Every day | ORAL | Status: DC
  Administered 2021-05-08 – 2021-05-10 (×3): 5 mg via ORAL
  Filled 2021-05-07 (×5): qty 1

## 2021-05-07 MED ORDER — AZITHROMYCIN 500 MG PO TABS
500.0000 mg | ORAL_TABLET | Freq: Every day | ORAL | Status: DC
  Administered 2021-05-08 – 2021-05-10 (×3): 500 mg via ORAL
  Filled 2021-05-07 (×3): qty 1

## 2021-05-07 MED ORDER — SENNOSIDES-DOCUSATE SODIUM 8.6-50 MG PO TABS: 1.0000 | ORAL_TABLET | Freq: Every evening | ORAL | Status: DC | PRN

## 2021-05-07 MED ORDER — SODIUM CHLORIDE 0.9 % IV SOLN
2.0000 g | Freq: Three times a day (TID) | INTRAVENOUS | Status: DC
  Administered 2021-05-07 – 2021-05-09 (×7): 2 g via INTRAVENOUS
  Filled 2021-05-07 (×7): qty 12.5

## 2021-05-07 MED ORDER — METOPROLOL SUCCINATE ER 25 MG PO TB24
25.0000 mg | ORAL_TABLET | Freq: Every day | ORAL | Status: DC
  Administered 2021-05-07 – 2021-05-10 (×4): 25 mg via ORAL
  Filled 2021-05-07 (×5): qty 1

## 2021-05-07 MED ORDER — UMECLIDINIUM BROMIDE 62.5 MCG/ACT IN AEPB
1.0000 | INHALATION_SPRAY | Freq: Every day | RESPIRATORY_TRACT | Status: DC
Start: 2021-05-07 — End: 2021-05-10
  Administered 2021-05-08 – 2021-05-10 (×3): 1 via RESPIRATORY_TRACT
  Filled 2021-05-07 (×2): qty 7

## 2021-05-07 MED ORDER — ENOXAPARIN SODIUM 40 MG/0.4ML IJ SOSY
40.0000 mg | PREFILLED_SYRINGE | INTRAMUSCULAR | Status: DC
  Administered 2021-05-07 – 2021-05-10 (×4): 40 mg via SUBCUTANEOUS
  Filled 2021-05-07 (×4): qty 0.4

## 2021-05-07 MED ORDER — FUROSEMIDE 20 MG PO TABS: 20.0000 mg | ORAL_TABLET | Freq: Every day | ORAL | Status: DC

## 2021-05-07 MED ORDER — SODIUM CHLORIDE 0.9 % IV SOLN
2.0000 g | Freq: Once | INTRAVENOUS | Status: AC
  Administered 2021-05-07: 2 g via INTRAVENOUS

## 2021-05-07 MED ORDER — ACETAMINOPHEN 650 MG RE SUPP: 650.0000 mg | Freq: Four times a day (QID) | RECTAL | Status: DC | PRN

## 2021-05-07 MED ORDER — LACTATED RINGERS IV SOLN: INTRAVENOUS | Status: DC

## 2021-05-07 MED ORDER — PANTOPRAZOLE SODIUM 40 MG PO TBEC
40.0000 mg | DELAYED_RELEASE_TABLET | Freq: Every day | ORAL | Status: DC
  Administered 2021-05-07 – 2021-05-10 (×4): 40 mg via ORAL
  Filled 2021-05-07 (×4): qty 1

## 2021-05-07 MED ORDER — ONDANSETRON HCL 4 MG/2ML IJ SOLN: 4.0000 mg | Freq: Four times a day (QID) | INTRAMUSCULAR | Status: DC | PRN

## 2021-05-07 MED ORDER — SODIUM CHLORIDE 0.9 % IV BOLUS
1000.0000 mL | Freq: Once | INTRAVENOUS | Status: AC
  Administered 2021-05-07: 1000 mL via INTRAVENOUS

## 2021-05-07 NOTE — H&P (Signed)
?Date: 05/07/2021     ?     ?     ?Patient Name:  James [name] MRN: [id number]  ?DOB: 1960/05/26 Age / Sex: 61 y.o., male   ?PCP: James [name]    ?     ?     ?Medical Service: Internal Medicine Teaching Service    ?     ?     ?Attending Physician: Dr. Sid Falcon, [name]    ?First Contact: James [name], [place] 4 Pager: [phone]  ?Second Contact: James [name] Pager: [phone]  ?     ?     ?After Hours (After 5p/  First Contact Pager: [phone]  ?weekends / holidays): Second Contact Pager: [phone]  ? ?Chief Complaint: shortness of breath  ? ?History of Present Illness: James [name] is a 61 year old male with a history of COPD on 2L O2, HTN, T2DM, prior polysubstance use, and GERD who presents with new onset shortness of breath. ? ?Patient was hospitalized most recently in February 2023 for severe sepsis from HCAP and chronic hypoxic respiratory failure, at which time he was treated with vancomycin and cefepime. In April 2023 he had a clinic follow up with his pulmonologist, where he was found to be in a COPD exacerbation and prescribed cefdinir x 7 days and prednisone x 5 days.  ? ?Since that visit, he had been feeling well overall. However, yesterday morning he started having shortness of breath and chest tightness, which feel similar to prior COPD exacerbations and pneumonia episodes. Over the course of the day he also had chills. He expected his symptoms to resolve so he did not seek care at that time. He slept well overnight but this morning had worsening shortness of breath. Chest pain and chills had resolved. Denies fevers, recent illness, sick contacts, or recent travel. Endorses cough with yellow sputum production but this is unchanged from his baseline. He reports that he is still taking Omnicef and prednisone from his Pulmonology visit last week.  ? ?In the ED, he was found to be febrile, tachycardic, tachypneic, and in respiratory distress, for which he was started on CPAP and later transitioned to BiPAP.  WBC 03.0 with neutrophilic predominance, glucose 147, lactic acid 2.3, and ABG with pH 7.33, pCO2 52.7, and pO2 145. CXR without any obvious consolidation. EKG with sinus tachycardia.  ? ?Meds: ?Albuterol PRN ?Trelegy ellipta 1 puff daily ?Cefdinir 300 mg BID ?Prednisone 10 mg daily ?Furosemide 20 mg daily ?Glipizide 5 mg daily ?Losartan 50 mg daily ?Metoprolol 25 mg daily ?Pantoprazole 40 mg daily ?Pregabalin 75 mg BID ? ? ?Allergies: NKDA ? ?Past Medical History:  ?Diagnosis Date  ? Acute on chronic respiratory failure with hypoxia ([place]) 06/08/2013  ? CAP (community acquired pneumonia) 05/17/2018  ? See admit 05/17/18 ? rml  with   covid pcr neg - rx augmentin > f/u cxr in 4-6 weeks is fine unless condition declines   ? Community acquired pneumonia of right lower lobe of lung 05/17/2018  ? See admit 05/17/18 ? rml  with   covid pcr neg - rx augmentin > f/u cxr in 4-6 weeks is fine unless condition declines   ? COPD (chronic obstructive pulmonary disease) ([place])   ? not on home  ? COVID-19 virus infection 10/19/2019  ? Diabetes ([place])   ? Dyspnea   ? HCAP (healthcare-associated pneumonia) 02/20/2021  ? Hypertension   ? Pneumonia 04/06/2016  ? Pneumonia due to COVID-19 virus 10/19/2019  ? Pneumothorax   ?  2016, fell from ladder  ? 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 Tick bite 06/03/2018  ? ? ?Family History: CAD and DM on paternal side, grandmother with lung problem  ? ?Social History: Patient lives at home with a roommate and is on disability. At baseline he is on 2L O2 and becomes dyspneic when walking up one flight of stairs and while washing his feet in the shower but is able to ambulate across flat surfaces within the home without difficulty. Endorses 72-108 pack year smoking history (2-3 packs per day over 36 years) but quit 10 years ago. Endorses prior history of heavy alcohol use but quit one year ago. Endorses history of using various recreational drugs but does not use anything currently.  ? ?Review of  Systems: ?A complete ROS was negative except as per HPI.  ? ?Physical Exam: ?Blood pressure 135/75, pulse (!) 110, temperature (!) 101.1 ?F (38.4 ?C), temperature source Axillary, resp. rate (!) 28, height 5\' 9"  (1.753 m), weight 91.6 kg, SpO2 93 %. ? ?General: Ill appearing but non-toxic. Lying in bed with BiPAP in place ?Neuro: A&O x3, normal affect ?HEENT: Normocephalic, atraumatic, EOMI, normal sclerae without scleral icterus or injection. Moist mucous membranes. Clear oropharynx ?Neurologic: CN II-XII intact. Sensation intact throughout. Strength 5/5 in bilateral upper and lower extremities. ?Cardiovascular: RRR, no m/r/g. Normal S1 and S2 without S3 or S4. Pulses 2+ in bilateral UE and LE. No peripheral edema ?Pulmonary: BiPAP taken off during exam and patient transitioned to 3L [place]. Breathing with pursed lips without significant recruitment of accessory muscles of respiration. Poor air movement throughout all lung fields. Intermittent expiratory wheezing heard anteriorly and at left base. Crackles at right base. Bilateral clubbing.  ?Abdominal: Abdomen soft and distended. Hypoactive bowel sounds throughout. No tenderness to palpation, guarding, or rebound tenderness ?Skin: Warm and dry with no rashes, cuts, or bruises ?MSK: Normal ROM of all extremities.  ? ? ?EKG: personally reviewed my interpretation is sinus tachycardia. ? ?CXR: personally reviewed my interpretation is mid residual LLL airspace disease. ? ?Assessment & Plan by Problem: ?Active Problems: ?  Sepsis ([place]) ? ?[name] is a 61 year old male with a history of COPD on 2L O2, HTN, T2DM, prior polysubstance use, and GERD who presents with new onset shortness of breath with concern for sepsis. ? ?Sepsis ?Patient with history of recurrent pneumonia presented to ED with shortness of breath and temperature of 101.1, HR 149, and RR 37. WBC 18.3 with neutrophil predominance. Lactic acid 2.3. Meets SIRS criteria based on temperature >38C, HR >90, RR >20,  and WBC count>12,000. SOFA score 0. High concern for infection at this time but unclear origin. Highest on the differential is pneumonia given RLL crackles. CXR without obvious consolidation but atypical pneumonia possible. No signs of skin or soft tissue infection on exam. No abdominal symptoms indicating intra-abdominal infection. Neuro exam intact and no signs of intracranial infection.  ?- cefepime x 7 days, azithromycin x 5 days ?- s/p 2L IVF in ED, continue x 24h at 125 mL/hr ?- blood cultures pending ?- sputum culture pending ?- UA pending  ?- RPP pending  ?- procalcitonin pending ? ?Acute on chronic hypercarbic respiratory failure  Respiratory acidosis with appropriate metabolic compensation ?Patient with severe COPD presented with shortness of breath and ABG found to have pH 7.33, pCO2 52.7, and pO2 145. Bicarb 28. Respiratory acidosis likely chronic and secondary to air trapping from longstanding COPD. Metabolic compensation appropriate for chronic respiratory acidosis. Patient was tachypneic on admission,  likely due to pneumonia so anticipate that pH is slightly increased on ABG from baseline. Clinically, he is breathing comfortably with adequate oxygen saturation on 3L [place]. He does have intermittent expiratory wheezing, concerning for COPD exacerbation but pneumonia is the more likely etiology of respiratory failure given the overall clinical picture. ?- continue home albuterol PRN ?- breo ellipta and incruse ellipta daily to replace home trelegy ?- RPP pending ?- hold home steroids in the setting of acute infection but can resume tomorrow  ? ?Type 2 diabetes mellitus - controlled ?Most recent A1c 6.5.  ?- SSI w/ meals ?- continue home glipizide ?- continue home pregabalin ? ?Hypertension ?- continue home metoprolol  ?- hold losartan for down-trending BPs, can resume tomorrow ? ?GERD ?- continue home pantoprazole ? ? ?Dispo: Admit patient to Inpatient with expected length of stay greater than 2  midnights. ? ?Signed: ?Halina Andreas, Medical Student ?05/07/2021, 5:26 PM  ?Pager: [phone] ? ?I have reviewed the note by James Savoy MS 4 and was present during the interview and physical exam.  I agree with th

## 2021-05-07 NOTE — Progress Notes (Signed)
Pt taken off BiPAP  and placed on 3L [place] by RT. Pt tolerating well at this time, MD aware, RT will continue to monitor.  ?

## 2021-05-07 NOTE — Hospital Course (Addendum)
This is a 61 year old male with history of COPD on 2L O2, recurrent pneumonia, HTN, T2DM, prior polysubstance use, and GERD who was admitted for sepsis and acute respiratory failure due to community acquired pneumonia.  ? ?Sepsis secondary to community acquired pneumonia ?Patient initially presented with SOB and found to be febrile, tachycardic, and tachypneic with leukocytosis above baseline. Procalcitonin elevated. Lactic acid elevated but down-trended. Patient met SIRS criteria on admission but SOFA score was 0. Respiratory pathogen panel and UA negative. CXR was clear but patient is immunocompromised and thought to be unable to mount an immune response resulting in a typical lobar consolidation. Urine strep pneumoniae antigen negative. Presentation most consistent with recurrence of community acquired pneumonia. Respiratory symptoms were mild and more consistent with atypical pneumonia but there was high concern for MDR organisms given immunocompromised state, chronic steroid use, and recurrent hospitalizations. He was treated with cefepime and azithromycin for coverage of typical, MDR, and atypical organisms. He was initially resuscitated with IVF per sepsis protocol but fluids were discontinued after 24 hours. Blood cultures remained negative.  He was discharged home to continue azithromycin for 2 more days. He will need further outpatient workup with a chest CT for evaluation of recurrent pneumonia.  ? ?Acute on chronic hypercarbic respiratory failure  ?COPD exacerbation ?Patient presented with shortness of breath and respiratory distress. Initial ABG with pH 7.33, pCO2 52.7, and pO2 145. Bicarb 28. Chronic compensated respiratory acidosis is secondary to air trapping with severe COPD. He required CPAP and BiPAP initially for work of breathing but subsequently weaned to 3L [place] without difficulty. Oxygen saturation remained adequate throughout admission. Respiratory failure thought to be secondary to pneumonia  and COPD exacerbation. He was treated with 2 days of prednisone 40 mg daily, albuterol PRN, and daily breo ellipta / incruse ellipta to replace home Trelegy. His oxygen was weaned to home 2L 1 day before discharge and he was able to ambulate without oxygen desaturations.  He was discharged home to continue prednisone 40 mg for 3 more days then back to his home regimen of 10 mg daily.  He was also instructed to follow-up with his PCP/pulmonologist, [name]. ? ? ?

## 2021-05-07 NOTE — ED Triage Notes (Signed)
Pt arrives via EMS from home with acute onset resp distress while laying in bed. Pt attempted with home inhaler. EMS gave 10 mg albuterol, a mg atrovent, 125 mg solu-medrol, 2 g mag. Pt denies any CP.  ?

## 2021-05-07 NOTE — Sepsis Progress Note (Signed)
Code sepsis protocol being monitored by eLink. 

## 2021-05-07 NOTE — ED Notes (Signed)
Lab to add on pathogen panel ?

## 2021-05-07 NOTE — Progress Notes (Signed)
Pharmacy Antibiotic Note ? ?RANDOM [name] is a 61 y.o. male admitted on 05/07/2021 with sepsis.  Pharmacy has been consulted for cefepime dosing. ? ?WBC elevated at 18.3, Tmax 101.1. Patient has had multiple prior admissions with antibiotics (including ceftriaxone and cefepime this year). Review of culture history indicates a wound culture in 2017 with acinetobacter, enterococcus faecalis, and pseudomonas. ? ?Plan: ?Cefepime 2g IV q8h  ?Azithromycin per MD ?F/u cultures, clinical course, and ability to de-escalate ? ?Height: 5\' 9"  (175.3 cm) ?Weight: 91.6 kg (202 lb) ?IBW/kg (Calculated) : 70.7 ? ?Temp (24hrs), Avg:101.1 ?F (38.4 ?C), Min:101.1 ?F (38.4 ?C), Max:101.1 ?F (38.4 ?C) ? ?Recent Labs  ?Lab 05/07/21 ?1114  ?WBC 18.3*  ?  ?CrCl cannot be calculated (Patient's most recent lab result is older than the maximum 21 days allowed.).   ? ?No Known Allergies ? ?Antimicrobials this admission: ?5/4 azithromycin >>  ?5/4 cefepime >>  ? ?Dose adjustments this admission: ? ? ?Microbiology results: ?5/4 BCx: sent ? ?Thank you for allowing pharmacy to be a part of this patientJamess care. ? ?Eduard Clos William Laske ?05/07/2021 11:45 AM ? ?

## 2021-05-07 NOTE — Progress Notes (Signed)
Pt placed on Bipap by RT. Pt tolerating well at this time. RN aware, RT will continue to monitor.  

## 2021-05-07 NOTE — ED Notes (Signed)
Got patient on the monitor did ekg shown to Dr Goldston patient is resting with call bell in reach 

## 2021-05-07 NOTE — ED Provider Notes (Signed)
?[place] ?Provider Note ? ? ?CSN: [id number] ?Arrival date & time: 05/07/21  1106 ? ?LEVEL 5 CAVEAT - RESPIRATORY DISTRESS ? ?History ? ?Chief Complaint  ?Patient presents with  ? Respiratory Distress  ? ? ?James [name] is a 61 y.o. male. ? ?HPI ?61 year old male with a history of COPD presents with respiratory distress.  History is initially from EMS and somewhat from the patient though limited because he is on CPAP.  Woke up feeling short of breath.  Unknown initial O2 sat but O2 sats have been okay since being on CPAP.  He was given albuterol nebs, 125 mg Solu-Medrol and 2 g mag.  EMS feels like he has not improved much though the patient tells me he is feeling somewhat better.  He denies chest pain or illness preceding this morning.  No leg swelling. ? ?Home Medications ?Prior to Admission medications   ?Medication Sig Start Date End Date Taking? Authorizing Provider  ?albuterol (PROVENTIL) (2.5 MG/3ML) 0.083% nebulizer solution TAKE 3 MLS (2.5 MG TOTAL) BY NEBULIZATION IN THE MORNING, AT NOON, AND AT BEDTIME. ?Patient taking differently: Take 2.5 mg by nebulization at bedtime. 12/22/20 12/22/21  [name], MD  ?albuterol (VENTOLIN HFA) 108 (90 Base) MCG/ACT inhaler Inhale 2 puffs into the lungs every 4 (four) hours as needed. 12/22/20   [name], MD  ?cefdinir (OMNICEF) 300 MG capsule Take 1 capsule (300 mg total) by mouth 2 (two) times daily. 04/29/21   [name], MD  ?Fluticasone-Umeclidin-Vilant (TRELEGY ELLIPTA) 100-62.5-25 MCG/ACT AEPB Inhale 1 puff into the lungs daily. 04/29/21   [name], MD  ?furosemide (LASIX) 20 MG tablet Take 1 tablet (20 mg total) by mouth daily as needed for edema. 04/29/21 04/29/22  [name], MD  ?glipiZIDE (GLUCOTROL XL) 5 MG 24 hr tablet Take 1 tablet (5 mg total) by mouth daily with breakfast. 04/29/21   [name], MD  ?losartan (COZAAR) 50 MG tablet Take 1 tablet (50 mg total) by mouth  daily. 03/10/21   [name], MD  ?metoprolol succinate (TOPROL-XL) 25 MG 24 hr tablet Take 1 tablet (25 mg total) by mouth daily. 03/10/21 03/10/22  [name], MD  ?pantoprazole (PROTONIX) 40 MG tablet Take 1 tablet (40 mg total) by mouth daily. 04/29/21   [name], MD  ?predniSONE (DELTASONE) 10 MG tablet Take 4 tablets daily for 5 days then one daily 04/29/21   [name], MD  ?pregabalin (LYRICA) 75 MG capsule Take 1 capsule (75 mg total) by mouth 2 (two) times daily. 03/10/21   [name], MD  ?Respiratory Therapy Supplies (FLUTTER) DEVI Use 4 times daily 04/27/18   [name], MD  ?   ? ?Allergies    ?Patient has no known allergies.   ? ?Review of Systems   ?Review of Systems  ?Unable to perform ROS: Severe respiratory distress  ? ?Physical Exam ?Updated Vital Signs ?BP 135/75   Pulse (!) 110   Temp (!) 101.1 ?F (38.4 ?C) (Axillary)   Resp (!) 28   Ht 5\' 9"  (1.753 m)   Wt 91.6 kg   SpO2 93%   BMI 29.83 kg/m?  ?Physical Exam ?Vitals and nursing note reviewed.  ?Constitutional:   ?   Appearance: He is well-developed.  ?HENT:  ?   Head: Normocephalic and atraumatic.  ?Cardiovascular:  ?   Rate and Rhythm: Regular rhythm. Tachycardia present.  ?   Heart sounds:  Normal heart sounds.  ?Pulmonary:  ?   Effort: Accessory muscle usage and respiratory distress present.  ?   Breath sounds: Decreased breath sounds and wheezing present.  ?   Comments: On CPAP. However it is noted the mask does not fit well and is not fully occluding his mouth ?Abdominal:  ?   Palpations: Abdomen is soft.  ?   Tenderness: There is no abdominal tenderness.  ?Musculoskeletal:  ?   Right lower leg: No edema.  ?   Left lower leg: No edema.  ?Skin: ?   General: Skin is warm and dry.  ?Neurological:  ?   Mental Status: He is alert.  ? ? ?ED Results / Procedures / Treatments   ?Labs ?(all labs ordered are listed, but only abnormal results are displayed) ?Labs Reviewed  ?COMPREHENSIVE METABOLIC PANEL -  Abnormal; Notable for the following components:  ?    Result Value  ? Glucose, Bld 147 (*)   ? Total Protein 6.1 (*)   ? All other components within normal limits  ?LACTIC ACID, PLASMA - Abnormal; Notable for the following components:  ? Lactic Acid, Venous 2.3 (*)   ? All other components within normal limits  ?CBC WITH DIFFERENTIAL/PLATELET - Abnormal; Notable for the following components:  ? WBC 18.3 (*)   ? Neutro Abs 15.2 (*)   ? All other components within normal limits  ?APTT - Abnormal; Notable for the following components:  ? aPTT 23 (*)   ? All other components within normal limits  ?I-STAT ARTERIAL BLOOD GAS, ED - Abnormal; Notable for the following components:  ? pH, Arterial 7.330 (*)   ? pCO2 arterial 52.7 (*)   ? pO2, Arterial 145 (*)   ? All other components within normal limits  ?TROPONIN I (HIGH SENSITIVITY) - Abnormal; Notable for the following components:  ? Troponin I (High Sensitivity) 19 (*)   ? All other components within normal limits  ?RESP PANEL BY RT-PCR (FLU A&B, COVID) [id number]  ?CULTURE, BLOOD (ROUTINE X 2)  ?CULTURE, BLOOD (ROUTINE X 2)  ?EXPECTORATED SPUTUM ASSESSMENT W GRAM STAIN, RFLX TO RESP C  ?RESPIRATORY PANEL BY PCR  ?BRAIN NATRIURETIC PEPTIDE  ?PROTIME-INR  ?LACTIC ACID, PLASMA  ?URINALYSIS, ROUTINE W REFLEX MICROSCOPIC  ?PROCALCITONIN  ?TSH  ?TROPONIN I (HIGH SENSITIVITY)  ? ? ?EKG ?EKG Interpretation ? 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 ?Radiology ?DG Chest Portable 1 View ? ?Result Date: 05/07/2021 ?CLINICAL DATA:  Respiratory distress EXAM: PORTABLE CHEST 1 VIEW COMPARISON:  02/20/2021 FINDINGS: Heart size and vascularity normal. Interval clearing of  extensive infiltrate on the left. Mild residual left lower lobe airspace disease may represent scarring or atelectasis. No effusion. Negative for heart failure. Multiple chronic rib fractures on the left unchanged. IMPRESSION: Interval clearing of extensive infiltrate on the left. There may be mild residual left lower lobe atelectasis or scarring. No new abnormality. Electronically Signed   By: Franchot Gallo M.D.   On: 05/07/2021 11:31   ? ?Procedures ?Marland KitchenCritical Care ?Performed by: [name] Gambler, MD ?Authorized by: [name] Gambler, MD  ? ?Critical care provider statement:  ?  Critical care time (minutes):  40 ?  Critical care time was exclusive of:  Separately billable procedures and treating other patients ?  Critical care was necessary to treat or prevent imminent or life-threatening deterioration of the following conditions:  Respiratory failure and sepsis ?  Critical care was time spent personally by me on the following activities:  Development of treatment plan with patient or surrogate, discussions with consultants, evaluation of patient's response to treatment, examination of patient, ordering and review of laboratory studies, ordering and review of radiographic studies, ordering and performing treatments and interventions, pulse oximetry, re-evaluation of patient's condition and review of old charts  ? ? ?Medications Ordered in ED ?Medications  ?ceFEPIme (MAXIPIME) 2 g in sodium chloride 0.9 % 100 mL IVPB (has no administration in time range)  ?enoxaparin (LOVENOX) injection 40 mg (has no administration in time range)  ?acetaminophen (TYLENOL) tablet 650 mg (has no administration in time range)  ?  Or  ?acetaminophen (TYLENOL) suppository 650 mg (has no administration in time range)  ?senna-docusate (Senokot-S) tablet 1 tablet (has no administration in time range)  ?ondansetron (ZOFRAN) tablet 4 mg (has no administration in time range)  ?  Or  ?ondansetron (ZOFRAN) injection 4 mg (has no administration in  time range)  ?insulin aspart (novoLOG) injection 0-15 Units (has no administration in time range)  ?glipiZIDE (GLUCOTROL XL) 24 hr tablet 5 mg (has no administration in time range)  ?metoprolol succinate (TOPROL-XL)

## 2021-05-07 NOTE — ED Notes (Signed)
Purple man disappeared on pt - confirmed with Blenda Mounts RN that pt was still going to be hers  ?

## 2021-05-08 ENCOUNTER — Inpatient Hospital Stay (HOSPITAL_COMMUNITY): Payer: Medicaid Other

## 2021-05-08 ENCOUNTER — Other Ambulatory Visit: Payer: Self-pay

## 2021-05-08 ENCOUNTER — Encounter (HOSPITAL_COMMUNITY): Payer: Self-pay | Admitting: Internal Medicine

## 2021-05-08 DIAGNOSIS — J439 Emphysema, unspecified: Secondary | ICD-10-CM | POA: Diagnosis not present

## 2021-05-08 DIAGNOSIS — J9620 Acute and chronic respiratory failure, unspecified whether with hypoxia or hypercapnia: Secondary | ICD-10-CM

## 2021-05-08 DIAGNOSIS — J449 Chronic obstructive pulmonary disease, unspecified: Secondary | ICD-10-CM | POA: Diagnosis not present

## 2021-05-08 LAB — CBC
HCT: 37.8 % — ABNORMAL LOW (ref 39.0–52.0)
Hemoglobin: 12.4 g/dL — ABNORMAL LOW (ref 13.0–17.0)
MCH: 30.8 pg (ref 26.0–34.0)
MCHC: 32.8 g/dL (ref 30.0–36.0)
MCV: 93.8 fL (ref 80.0–100.0)
Platelets: 258 10*3/uL (ref 150–400)
RBC: 4.03 MIL/uL — ABNORMAL LOW (ref 4.22–5.81)
RDW: 14 % (ref 11.5–15.5)
WBC: 25.1 10*3/uL — ABNORMAL HIGH (ref 4.0–10.5)
nRBC: 0 % (ref 0.0–0.2)

## 2021-05-08 LAB — MAGNESIUM: Magnesium: 2.3 mg/dL (ref 1.7–2.4)

## 2021-05-08 LAB — BASIC METABOLIC PANEL
Anion gap: 6 (ref 5–15)
BUN: 13 mg/dL (ref 6–20)
CO2: 28 mmol/L (ref 22–32)
Calcium: 8.9 mg/dL (ref 8.9–10.3)
Chloride: 107 mmol/L (ref 98–111)
Creatinine, Ser: 0.79 mg/dL (ref 0.61–1.24)
GFR, Estimated: >60 mL/min (ref >60)
Glucose, Bld: 127 mg/dL — ABNORMAL HIGH (ref 70–99)
Potassium: 4.5 mmol/L (ref 3.5–5.1)
Sodium: 141 mmol/L (ref 135–145)

## 2021-05-08 LAB — GLUCOSE, CAPILLARY
Glucose-Capillary: 125 mg/dL — ABNORMAL HIGH (ref 70–99)
Glucose-Capillary: 103 mg/dL — ABNORMAL HIGH (ref 70–99)
Glucose-Capillary: 122 mg/dL — ABNORMAL HIGH (ref 70–99)
Glucose-Capillary: 158 mg/dL — ABNORMAL HIGH (ref 70–99)

## 2021-05-08 LAB — STREP PNEUMONIAE URINARY ANTIGEN: Strep Pneumo Urinary Antigen: NEGATIVE

## 2021-05-08 MED ORDER — PREDNISONE 5 MG PO TABS
10.0000 mg | ORAL_TABLET | Freq: Every day | ORAL | Status: DC
  Administered 2021-05-08 – 2021-05-09 (×2): 10 mg via ORAL
  Filled 2021-05-08 (×2): qty 2

## 2021-05-08 MED ORDER — LOSARTAN POTASSIUM 50 MG PO TABS
50.0000 mg | ORAL_TABLET | Freq: Every day | ORAL | Status: DC
  Administered 2021-05-08 – 2021-05-10 (×3): 50 mg via ORAL
  Filled 2021-05-08 (×3): qty 1

## 2021-05-08 NOTE — Progress Notes (Signed)
?  Transition of Care (TOC) Screening Note ? ? ?Patient Details  ?Name: James [name] ?Date of Birth: 01/04/1961 ? ? ?Transition of Care (TOC) CM/SW Contact:    ?Benard Halsted, LCSW ?Phone Number: ?05/08/2021, 9:13 AM ? ? ? ?Transition of Care Department [facility]) has reviewed patient and no TOC needs have been identified at this time. We will continue to monitor patient advancement through interdisciplinary progression rounds. If new patient transition needs arise, please place a TOC consult. ? ? ?

## 2021-05-08 NOTE — Progress Notes (Addendum)
? ?Subjective: ? ?No acute events overnight and no complaints this morning. Shortness of breath is stable from yesterday. Denies chest pain, abdominal pain, nausea, vomiting, or weakness. No recurrence of fever.  ? ?Objective: ? ?Vital signs in last 24 hours: ?Vitals:  ? 05/08/21 0328 05/08/21 1696 05/08/21 0831 05/08/21 0932  ?BP: 123/71 135/77 135/77 135/77  ?Pulse:  71 68   ?Resp: 20  (!) 21   ?Temp:   98.5 ?F (36.9 ?C)   ?TempSrc:   Oral   ?SpO2: 97%  98%   ?Weight:      ?Height:      ? ?Weight change:  ? ?Intake/Output Summary (Last 24 hours) at 05/08/2021 1118 ?Last data filed at 05/08/2021 0344 ?Gross per 24 hour  ?Intake 1494.69 ml  ?Output 1250 ml  ?Net 244.69 ml  ? ?General: Well appearing and in no acute distress ?Neuro: A&O x3, normal affect ?Cardiovascular: RRR, no m/r/g. Normal S1 and S2 without S3 or S4. Pulses 2+ in bilateral UE and LE. No peripheral edema ?Pulmonary: Breathing comfortably on room air. Poor air movement throughout but primarily at bilateral lung apices. Intermittent expiratory wheezing heard anteriorly and at bilateral bases.  ?Abdominal: Abdomen soft and non-distended. Normoactive bowel sounds throughout. No tenderness to palpation, guarding, or rebound tenderness ?Skin: Warm and dry with no rashes, cuts, or bruises ?MSK: Normal ROM of all extremities.  ? ? ?Assessment/Plan: ? ?Principal Problem: ?  Sepsis ([place]) ? ?James [name] is a 61 year old male with a history of COPD on 2L O2, HTN, T2DM, prior polysubstance use, and GERD who presents with new onset shortness of breath and found to have sepsis secondary to pneumonia. ?  ?Sepsis secondary to community acquired pneumonia - improving ?Patient with history of recurrent pneumonia presented to ED with shortness of breath and temperature of 101.1, HR 149, and RR 37. WBC 18.3 -> 25.1 with neutrophil predominance. Procalcitonin 2.08. Lactic acid 2.3 ->1.7. Meets SIRS criteria based on temperature >38C, HR >90, RR >20, and WBC count>12,000.  SOFA score 0. Respiratory pathogen panel and UA negative. High concern for recurrence of community acquired pneumonia. No lobar consolidation on CXR but patient is immunocompromised so may not be able to respond to a typical pathogen with a lobar consolidation. High concern for MDR organisms given history of recurrent pneumonia with hospitalizations and immunocompromised state but CXR and mild nature of clinical symptoms more consistent with atypical pneumonia so will cover for both at this time. No signs of skin / soft tissue infection, abdominal infection, or intracranial pathology. Clinically, patient is improving on current antibiotic regimen with vitals back to baseline and breathing comfortably on 3L [place]. No signs of septic shock.  ?- cefepime x 7 days, azithromycin x 5 days ?- discontinue IVF ?- blood cultures pending ?- sputum culture pending ?- urine strep antigen pending ?- wean O2 as able ?- repeat CXR in AM, consider chest CT if clinically worsening  ?  ?Acute on chronic hypercarbic respiratory failure  Respiratory acidosis with appropriate metabolic compensation ?Patient with severe COPD presented with shortness of breath and ABG found to have pH 7.33, pCO2 52.7, and pO2 145. Bicarb 28. Respiratory pathogen panel negative. Respiratory acidosis likely chronic and secondary to air trapping from longstanding COPD. Metabolic compensation appropriate for chronic respiratory acidosis. Patient was tachypneic on admission, likely due to pneumonia so anticipate that pH is slightly increased on ABG from baseline. Clinically, he is breathing comfortably with adequate oxygen saturation on 3L [place]. He does  have intermittent expiratory wheezing, concerning for COPD exacerbation but pneumonia is the more likely etiology of respiratory failure given the overall clinical picture. ?- continue home albuterol PRN ?- breo ellipta and incruse ellipta daily to replace home trelegy ?- restart home prednisone ?  ?Type 2 diabetes  mellitus - controlled ?Most recent A1c 6.5.  ?- SSI w/ meals ?- continue home glipizide ?- continue home pregabalin ?  ?Hypertension ?- continue home metoprolol  ?- hold losartan for down-trending BPs, can resume tomorrow ?  ?GERD ?- continue home pantoprazole ? ? LOS: 1 day  ? ?Halina Andreas, Medical Student ?05/08/2021, 11:18 AM ? ?Attestation for Student Documentation: ? ?I personally was present and performed or re-performed the history, physical exam and medical decision-making activities of this service and have verified that the service and findings are accurately documented in the student?s note. ? Harvie Heck, MD ?[facility] PGY-3 ?05/08/2021, 11:52 AM ? ? ?

## 2021-05-08 NOTE — ED Notes (Signed)
Per floor RN ready to receive pt  ?

## 2021-05-08 NOTE — Evaluation (Signed)
Physical Therapy Evaluation ?Patient Details ?Name: James [name] ?MRN: [id number] ?DOB: 11-07-1960 ?Today's Date: 05/08/2021 ? ?History of Present Illness ? Pt is 61 yr old M admitted on 05/07/21 with SOB; found to be septic.  PMH: chronic resp failure, COPD, DM, HTN, pneumothorax, polyneuropathy  ?Clinical Impression ? Pt was previously independent with all ADLs and functional mobility prior to hospitalization for sepsis.  Pt currently maintains level of independence with transfers and gait, ambulating in halls without difficulty with PT only assisting with IV pole negotiation.  DBP is noted to elevate above 100 with activity and NSG is notified.  Pt remains asymptomatic.  Due to currently level of independence, pt is not appropriate for skilled PT in acute care but is encouraged to continue to mobilize with mobility team/NSG staff during hospital admission.  No PT or AD required upon D/C.   ?   ? ?Recommendations for follow up therapy are one component of a multi-disciplinary discharge planning process, led by the attending physician.  Recommendations may be updated based on patient status, additional functional criteria and insurance authorization. ? ?Follow Up Recommendations No PT follow up ? ?  ?Assistance Recommended at Discharge None  ?Patient can return home with the following ? Help with stairs or ramp for entrance ? ?  ?Equipment Recommendations None recommended by PT  ?Recommendations for Other Services ?    ?  ?Functional Status Assessment Patient has not had a recent decline in their functional status  ? ?  ?Precautions / Restrictions Precautions ?Precautions: None  ? ?  ? ?Mobility ? Bed Mobility ?Overal bed mobility: Independent ?  ?  ?  ?  ?  ?  ?General bed mobility comments: Transfers to EOB without difficulty.  Demos good sitting balance. ?Patient Response: Cooperative ? ?Transfers ?Overall transfer level: Independent ?  ?  ?  ?  ?  ?  ?  ?  ?General transfer comment: Stands independently.  Demos  good standing balance without use of AD. ?  ? ?Ambulation/Gait ?Ambulation/Gait assistance: Independent ?Gait Distance (Feet): 240 Feet ?Assistive device: None ?Gait Pattern/deviations: WFL(Within Functional Limits) ?  ?  ?  ?General Gait Details: Amb in halls without difficulty on 3L O2.  Demos good balance with negotiating turns.  Appears slightly SOB with activity, but O2 sats remain stable in 90s and pt declines any symptoms of being SOB.  BP does elevate from 135/77 to 139/109 with activity. ? ?Stairs ?  ?  ?  ?  ?  ? ?Wheelchair Mobility ?  ? ?Modified Rankin (Stroke Patients Only) ?  ? ?  ? ?Balance Overall balance assessment: Independent ?  ?  ?  ?  ?  ?  ?  ?  ?  ?  ?  ?  ?  ?  ?  ?  ?  ?  ?   ? ? ? ?Pertinent Vitals/Pain Pain Assessment ?Pain Assessment: 0-10 ?Pain Score: 0-No pain  ? ? ?Home Living Family/patient expects to be discharged to:: Private residence ?Living Arrangements: Non-relatives/Friends ?Available Help at Discharge: Available PRN/intermittently (Roommate works, available intermittently) ?Type of Home: House ?Home Access: Stairs to enter ?  ?Entrance Stairs-Number of Steps: 1 ?  ?Home Layout: One level;Other (Comment) (Reports single step into room) ?Home Equipment: None ?   ?  ?Prior Function Prior Level of Function : Independent/Modified Independent ?  ?  ?  ?  ?  ?  ?  ?  ?  ? ? ?Hand Dominance  ?   ? ?  ?  Extremity/Trunk Assessment  ? Upper Extremity Assessment ?Upper Extremity Assessment: Defer to OT evaluation ?  ? ?Lower Extremity Assessment ?Lower Extremity Assessment: Overall WFL for tasks assessed ?  ? ?   ?Communication  ? Communication: No difficulties  ?Cognition Arousal/Alertness: Awake/alert ?Behavior During Therapy: [facility] for tasks assessed/performed ?Overall Cognitive Status: Within Functional Limits for tasks assessed ?  ?  ?  ?  ?  ?  ?  ?  ?  ?  ?  ?  ?  ?  ?  ?  ?General Comments: Pt is supine in bed when PT arrives.  Agreeable to work with PT.  States he would like to  return home today. ?  ?  ? ?  ?General Comments   ? ?  ?Exercises    ? ?Assessment/Plan  ?  ?PT Assessment Patient does not need any further PT services  ?PT Problem List   ? ?   ?  ?PT Treatment Interventions     ? ?PT Goals (Current goals can be found in the Care Plan section)  ?Acute Rehab PT Goals ?Patient Stated Goal: Pt's goal is to return home today. ?PT Goal Formulation: With patient ?Time For Goal Achievement: 05/22/21 ?Potential to Achieve Goals: Good ? ?  ?Frequency   ?  ? ? ?Co-evaluation   ?  ?  ?  ?  ? ? ?  ?AM-PAC PT "6 Clicks" Mobility  ?Outcome Measure Help needed turning from your back to your side while in a flat bed without using bedrails?: None ?Help needed moving from lying on your back to sitting on the side of a flat bed without using bedrails?: None ?Help needed moving to and from a bed to a chair (including a wheelchair)?: None ?Help needed standing up from a chair using your arms (e.g., wheelchair or bedside chair)?: None ?Help needed to walk in hospital room?: None ?Help needed climbing 3-5 steps with a railing? : A Little ?6 Click Score: 23 ? ?  ?End of Session Equipment Utilized During Treatment: Gait belt ?Activity Tolerance: Patient tolerated treatment well ?Patient left: in chair;with call bell/phone within reach;with chair alarm set ?Nurse Communication: Mobility status ?PT Visit Diagnosis: Other abnormalities of gait and mobility (R26.89) ?  ? ?Time: 8527-7824 ?PT Time Calculation (min) (ACUTE ONLY): 33 min ? ? ?Charges:   PT Evaluation ?$PT Eval Low Complexity: 1 Low ?PT Treatments ?$Gait Training: 8-22 mins ?  ?   ? ?Etan Vasudevan A. Amran Malter, PT, DPT ?Acute Rehabilitation Services ?Office: [phone]  ? ?[place] ?05/08/2021, 9:38 AM ? ?

## 2021-05-08 NOTE — Plan of Care (Signed)
?  Problem: Health Behavior/Discharge Planning: ?Goal: Ability to manage health-related needs will improve ?Outcome: Progressing ?  ?Problem: Nutrition: ?Goal: Adequate nutrition will be maintained ?Outcome: Progressing ?  ?Problem: Safety: ?Goal: Ability to remain free from injury will improve ?Outcome: Progressing ?  ?Problem: Skin Integrity: ?Goal: Risk for impaired skin integrity will decrease ?Outcome: Progressing ?  ?

## 2021-05-08 NOTE — Evaluation (Signed)
Occupational Therapy Evaluation and Discharge ?Patient Details ?Name: James [name] ?MRN: [id number] ?DOB: 11/09/1960 ?Today's Date: 05/08/2021 ? ? ?History of Present Illness Pt is 61 yr old M admitted on 05/07/21 with SOB; found to be septic.  PMH: chronic resp failure, COPD, DM, HTN, pneumothorax, polyneuropathy  ? ?Clinical Impression ?  ?Pt is functioning independently in mobility and modified independently in ADLs. Pt knowledgeable in pursed lip breathing. Reinforced energy conservation strategies and recommended seated showering, pt prefers to get down in tub. No further OT needs.   ?   ? ?Recommendations for follow up therapy are one component of a multi-disciplinary discharge planning process, led by the attending physician.  Recommendations may be updated based on patient status, additional functional criteria and insurance authorization.  ? ?Follow Up Recommendations ? No OT follow up  ?  ?Assistance Recommended at Discharge PRN  ?Patient can return home with the following Assist for transportation ? ?  ?Functional Status Assessment ? Patient has had a recent decline in their functional status and demonstrates the ability to make significant improvements in function in a reasonable and predictable amount of time.  ?Equipment Recommendations ? None recommended by OT  ?  ?Recommendations for Other Services   ? ? ?  ?Precautions / Restrictions Precautions ?Precautions: None  ? ?  ? ?Mobility Bed Mobility ?  ?  ?  ?  ?  ?  ?  ?General bed mobility comments: in chair ?  ? ?Transfers ?Overall transfer level: Independent ?Equipment used: None ?  ?  ?  ?  ?  ?  ?  ?  ?  ? ?  ?Balance Overall balance assessment: Independent ?  ?  ?  ?  ?  ?  ?  ?  ?  ?  ?  ?  ?  ?  ?  ?  ?  ?  ?   ? ?ADL either performed or assessed with clinical judgement  ? ?ADL Overall ADL's : Modified independent ?  ?  ?  ?  ?  ?  ?  ?  ?  ?  ?  ?  ?  ?  ?  ?  ?  ?  ?  ?General ADL Comments: Educated pt in energy conservation. Pt knowledgeable in  pursed lip breathing. Declined shower seat.  ? ? ? ?Vision Ability to See in Adequate Light: 0 Adequate ?Patient Visual Report: No change from baseline ?   ?   ?Perception   ?  ?Praxis   ?  ? ?Pertinent Vitals/Pain Pain Assessment ?Pain Assessment: No/denies pain  ? ? ? ?Hand Dominance Right ?  ?Extremity/Trunk Assessment Upper Extremity Assessment ?Upper Extremity Assessment: Overall WFL for tasks assessed ?  ?Lower Extremity Assessment ?Lower Extremity Assessment: Overall WFL for tasks assessed ?  ?Cervical / Trunk Assessment ?Cervical / Trunk Assessment: Normal ?  ?Communication Communication ?Communication: No difficulties ?  ?Cognition Arousal/Alertness: Awake/alert ?Behavior During Therapy: [facility] for tasks assessed/performed ?Overall Cognitive Status: Within Functional Limits for tasks assessed ?  ?  ?  ?  ?  ?  ?  ?  ?  ?  ?  ?  ?  ?  ?  ?  ?General Comments: appears to have low medical literacy ?  ?  ?General Comments    ? ?  ?Exercises   ?  ?Shoulder Instructions    ? ? ?Home Living Family/patient expects to be discharged to:: Private residence ?Living Arrangements: Non-relatives/Friends ?Available Help at Discharge: Available  PRN/intermittently ?Type of Home: House ?Home Access: Stairs to enter ?Entrance Stairs-Number of Steps: 1 ?Entrance Stairs-Rails: None ?Home Layout: One level;Other (Comment) (single step into room) ?  ?  ?Bathroom Shower/Tub: Tub/shower unit ?  ?Bathroom Toilet: Standard ?  ?  ?Home Equipment: None ?  ?  ?  ? ?  ?Prior Functioning/Environment Prior Level of Function : Independent/Modified Independent ?  ?  ?  ?  ?  ?  ?  ?ADLs Comments: Pt reports he tub bathes, but has had a couple of falls getting out of tub, but has placed a mat in bottom of tub which has reduced falls.  he reports he is able to grocery shop mod I. ?  ? ?  ?  ?OT Problem List:   ?  ?   ?OT Treatment/Interventions:    ?  ?OT Goals(Current goals can be found in the care plan section)    ?OT Frequency:   ?   ? ?Co-evaluation   ?  ?  ?  ?  ? ?  ?AM-PAC OT "6 Clicks" Daily Activity     ?Outcome Measure Help from another person eating meals?: None ?Help from another person taking care of personal grooming?: None ?Help from another person toileting, which includes using toliet, bedpan, or urinal?: None ?Help from another person bathing (including washing, rinsing, drying)?: None ?Help from another person to put on and taking off regular upper body clothing?: None ?Help from another person to put on and taking off regular lower body clothing?: None ?6 Click Score: 24 ?  ?End of Session Equipment Utilized During Treatment: Oxygen ? ?Activity Tolerance: Patient tolerated treatment well ?Patient left: in chair;with call bell/phone within reach;with nursing/sitter in room ? ?OT Visit Diagnosis: Other (comment) (decreasee activity tolerance)  ?              ?Time: 0349-1791 ?OT Time Calculation (min): 15 min ?Charges:  OT General Charges ?$OT Visit: 1 Visit ?OT Evaluation ?$OT Eval Low Complexity: 1 Low ? ?Nestor Lewandowsky, OTR/L ?Acute Rehabilitation Services ?Pager: [phone] ?Office: [phone]  ? ?[name] ?05/08/2021, 10:01 AM ?

## 2021-05-08 NOTE — Progress Notes (Signed)
Physical Therapy Discharge ?Patient Details ?Name: James [name] ?MRN: [id number] ?DOB: [birth year] ?Today's Date: 05/08/2021 ?Time: 5217-4715 ?PT Time Calculation (min) (ACUTE ONLY): 33 min ? ?Patient discharged from PT services secondary to goals met and no further PT needs identified. ? ?Please see latest therapy progress note for current level of functioning and progress toward goals.   ? ?Progress and discharge plan discussed with patient and/or caregiver: Patient/Caregiver agrees with plan ? ?GP ?  Elna Radovich A. Kaiah Hosea, PT, DPT ?Acute Rehabilitation Services ?Office: [phone]  ? ?[place] ?05/08/2021, 9:42 AM  ?

## 2021-05-09 LAB — BASIC METABOLIC PANEL
Anion gap: 8 (ref 5–15)
BUN: 18 mg/dL (ref 6–20)
CO2: 26 mmol/L (ref 22–32)
Calcium: 8.9 mg/dL (ref 8.9–10.3)
Chloride: 107 mmol/L (ref 98–111)
Creatinine, Ser: 0.79 mg/dL (ref 0.61–1.24)
GFR, Estimated: >60 mL/min (ref >60)
Glucose, Bld: 96 mg/dL (ref 70–99)
Potassium: 4.3 mmol/L (ref 3.5–5.1)
Sodium: 141 mmol/L (ref 135–145)

## 2021-05-09 LAB — GLUCOSE, CAPILLARY
Glucose-Capillary: 87 mg/dL (ref 70–99)
Glucose-Capillary: 164 mg/dL — ABNORMAL HIGH (ref 70–99)
Glucose-Capillary: 130 mg/dL — ABNORMAL HIGH (ref 70–99)
Glucose-Capillary: 251 mg/dL — ABNORMAL HIGH (ref 70–99)

## 2021-05-09 LAB — CBC
HCT: 38.4 % — ABNORMAL LOW (ref 39.0–52.0)
Hemoglobin: 12 g/dL — ABNORMAL LOW (ref 13.0–17.0)
MCH: 30.3 pg (ref 26.0–34.0)
MCHC: 31.3 g/dL (ref 30.0–36.0)
MCV: 97 fL (ref 80.0–100.0)
Platelets: 249 10*3/uL (ref 150–400)
RBC: 3.96 MIL/uL — ABNORMAL LOW (ref 4.22–5.81)
RDW: 13.9 % (ref 11.5–15.5)
WBC: 13.8 10*3/uL — ABNORMAL HIGH (ref 4.0–10.5)
nRBC: 0 % (ref 0.0–0.2)

## 2021-05-09 MED ORDER — PREDNISONE 5 MG PO TABS
30.0000 mg | ORAL_TABLET | Freq: Once | ORAL | Status: AC
  Administered 2021-05-09: 30 mg via ORAL
  Filled 2021-05-09: qty 2

## 2021-05-09 MED ORDER — PREDNISONE 20 MG PO TABS
40.0000 mg | ORAL_TABLET | Freq: Every day | ORAL | Status: DC
  Administered 2021-05-10: 40 mg via ORAL
  Filled 2021-05-09: qty 2

## 2021-05-09 NOTE — Progress Notes (Addendum)
? ?Subjective: ? ?Patient still endorses mild shortness of breath and stable cough but no other concerns. Denies fevers, chills, chest pain, abdominal pain, nausea, vomiting, diarrhea, constipation, or urinary symptoms.  ? ?Objective: ? ?Vital signs in last 24 hours: ?Vitals:  ? 05/08/21 1626 05/08/21 2000 05/09/21 0027 05/09/21 0400  ?BP: 109/71 113/64 130/78 135/70  ?Pulse: 68     ?Resp: 20 (!) _0 ?Temp: 97.6 ?F (36.4 ?C) 98.4 ?F (36.9 ?C) 97.7 ?F (36.5 ?C) 98 ?F (36.7 ?C)  ?TempSrc: Oral Oral Oral Oral  ?SpO2: 99% 98% 98% 96%  ?Weight:      ?Height:      ? ?Weight change:  ? ?Intake/Output Summary (Last 24 hours) at 05/09/2021 0744 ?Last data filed at 05/09/2021 5573 ?Gross per 24 hour  ?Intake 1108.01 ml  ?Output 2600 ml  ?Net -1491.99 ml  ? ?General: Well appearing and in no acute distress ?Neuro: A&O x3, normal affect ?Cardiovascular: RRR, no m/r/g. Normal S1 and S2 without S3 or S4. Pulses 2+ in bilateral UE and LE. No peripheral edema ?Pulmonary: Breathing comfortably on 3L [place]. Poor air movement at bilateral ling apices. Diffuse expiratory wheezing heard throughout. No crackles. ?Abdominal: Abdomen soft and non-distended. Normoactive bowel sounds. No tenderness to palpation, guarding, or rebound tenderness ?Skin: Warm and dry with no rashes, cuts, or bruises ? ? ?Assessment/Plan: ? ?Principal Problem: ?  Sepsis ([place]) ?Active Problems: ?  Acute on chronic respiratory failure ([place]) ? ? ?Mr. Hetzer is a 61 year old male with a history of COPD on 2L O2, HTN, T2DM, prior polysubstance use, and GERD who presents with new onset shortness of breath and found to have sepsis secondary to pneumonia. ?  ?Sepsis secondary to community acquired pneumonia - improving ?Patient with history of recurrent pneumonia presented to ED with shortness of breath and temperature of 101.1, HR 149, and RR 37. WBC 18.3 -> 25.1 with neutrophil predominance. Procalcitonin 2.08. Lactic acid 2.3 ->1.7. Initially met SIRS criteria but  SOFA score 0. Respiratory pathogen panel and UA negative. Presentation consistent with recurrence of community acquired pneumonia. Urine strep pneumoniae antigen negative. No lobar consolidation on CXR but patient is immunocompromised so may not be able to respond to a typical pathogen with a lobar consolidation. High concern for MDR organisms given history of recurrent pneumonia with hospitalizations and immunocompromised state but CXR and mild nature of clinical symptoms more consistent with atypical pneumonia so will cover for both at this time. No signs of skin / soft tissue infection, abdominal infection, or intracranial pathology. Repeat CXR with progressive patchy bibasilar airspace opacities, R>L, consistent with atelectasis. Clinically, patient is improving on current antibiotic regimen and breathing comfortably on 3L [place].  ?- cefepime x 7 days, azithromycin x 5 days ?- s/p initial IVF resuscitation per sepsis protocol  ?- blood cultures negative at 24 hours ?- wean O2 as able ?- encourage incentive spirometry  ?- ambulation with O2 monitoring ?- consider outpatient CT chest for further evaluation of recurrent pneumonia ?  ?Acute on chronic hypercarbic respiratory failure  Respiratory acidosis with appropriate metabolic compensation ?Patient with severe COPD presented with shortness of breath and ABG found to have pH 7.33, pCO2 52.7, and pO2 145. Bicarb 28. Respiratory pathogen panel negative. Respiratory acidosis likely chronic and secondary to air trapping from longstanding COPD. Patient was tachypneic on admission, likely due to pneumonia so anticipate that pH is slightly increased on ABG from baseline. Clinically, he is breathing comfortably with adequate oxygen saturation  on 3L [place]. Respiratory failure likely secondary to pneumonia and concomitant COPD exacerbation.  ?- continue home albuterol PRN ?- breo ellipta and incruse ellipta daily to replace home trelegy ?- increase prednisone to 40 mg daily x 5  days and then resume home 10 mg daily indefinitely  ?  ?Type 2 diabetes mellitus - controlled ?Most recent A1c 6.5.  ?- SSI w/ meals ?- continue home glipizide ?- continue home pregabalin ?  ?Hypertension ?- continue home metoprolol and losartan ?  ?GERD ?- continue home pantoprazole ? ? LOS: 2 days  ? ?Halina Andreas, Medical Student ?05/09/2021, 7:44 AM ? ?I have reviewed the note by Pollyann Savoy MS 4 and was present during the interview and physical exam.  I agree with the findings, assessment, and plan. ? ?[name], MD ?05/09/2021, 5:17 PM ?IM Resident, PGY-2 ?Pager: [phone] ?Isaiah 41:10 ? ?

## 2021-05-09 NOTE — Progress Notes (Signed)
SATURATION QUALIFICATIONS: (This note is used to comply with regulatory documentation for home oxygen) ? ?Patient Saturations on Room Air at Rest = 90% ? ?Patient Saturations on Room Air while Ambulating = 87-89% ? ?Patient Saturations on 2 Liters of oxygen while Ambulating = 92% ? ?Please briefly explain why patient needs home oxygen: ? ?Pt has already been on O2 at home 2L/[place] ? ? ?Sat 91% on RA resting in chair ?Sats down to 87-89% ambulating in hall on RA with work of breathing when ambulating with and without O2 on. ?Sats up to 92% with O2 2L ?

## 2021-05-10 LAB — CBC
HCT: 37.8 % — ABNORMAL LOW (ref 39.0–52.0)
Hemoglobin: 12.4 g/dL — ABNORMAL LOW (ref 13.0–17.0)
MCH: 30.6 pg (ref 26.0–34.0)
MCHC: 32.8 g/dL (ref 30.0–36.0)
MCV: 93.3 fL (ref 80.0–100.0)
Platelets: 261 10*3/uL (ref 150–400)
RBC: 4.05 MIL/uL — ABNORMAL LOW (ref 4.22–5.81)
RDW: 13.5 % (ref 11.5–15.5)
WBC: 12.5 10*3/uL — ABNORMAL HIGH (ref 4.0–10.5)
nRBC: 0 % (ref 0.0–0.2)

## 2021-05-10 LAB — GLUCOSE, CAPILLARY
Glucose-Capillary: 111 mg/dL — ABNORMAL HIGH (ref 70–99)
Glucose-Capillary: 178 mg/dL — ABNORMAL HIGH (ref 70–99)
Glucose-Capillary: 184 mg/dL — ABNORMAL HIGH (ref 70–99)

## 2021-05-10 MED ORDER — GUAIFENESIN 100 MG/5ML PO LIQD
5.0000 mL | ORAL | 0 refills | Status: DC | PRN
  Filled 2021-05-10: qty 120, 4d supply, fill #0

## 2021-05-10 MED ORDER — PREDNISONE 10 MG PO TABS
ORAL_TABLET | ORAL | 2 refills | Status: DC
  Filled 2021-05-10: qty 60, fill #0
  Filled 2021-05-11: qty 60, 30d supply, fill #0

## 2021-05-10 MED ORDER — AZITHROMYCIN 500 MG PO TABS
500.0000 mg | ORAL_TABLET | Freq: Every day | ORAL | 0 refills | Status: DC
  Filled 2021-05-10 – 2021-05-11 (×2): qty 2, 2d supply, fill #0

## 2021-05-10 NOTE — Plan of Care (Signed)
?  Problem: Health Behavior/Discharge Planning: ?Goal: Ability to manage health-related needs will improve ?Outcome: Progressing ?  ?Problem: Nutrition: ?Goal: Adequate nutrition will be maintained ?Outcome: Progressing ?  ?Problem: Safety: ?Goal: Ability to remain free from injury will improve ?Outcome: Progressing ?  ?Problem: Skin Integrity: ?Goal: Risk for impaired skin integrity will decrease ?Outcome: Progressing ?  ?

## 2021-05-10 NOTE — TOC Transition Note (Signed)
Transition of Care (TOC) - CM/SW Discharge Note ? ? ?Patient Details  ?Name: James [name] ?MRN: [id number] ?Date of Birth: 01/21/1960 ? ?Transition of Care (TOC) CM/SW Contact:  ?Ehren Berisha, [place], [place] ?Phone Number: ?05/10/2021, 12:23 PM ? ? ?Clinical Narrative:    ? ?Phone call to patient to confirm that he has transportation home today. Per patient, he has a friend that will be picking him up this afternoon. He has already called. ? ?[employer], LCSW ?Transition of Care ?[phone] ? ?  ?  ? ? ?Patient Goals and CMS Choice ?  ?  ?  ? ?Discharge Placement ?  ?           ?  ?  ?  ?  ? ?Discharge Plan and Services ?  ?  ?           ?  ?  ?  ?  ?  ?  ?  ?  ?  ?  ? ?Social Determinants of Health (SDOH) Interventions ?  ? ? ?Readmission Risk Interventions ? ?  02/04/2021  ? 12:44 PM 08/14/2020  ?  9:59 AM 04/24/2020  ?  1:03 PM  ?Readmission Risk Prevention Plan  ?Post Dischage Appt Complete    ?Medication Screening Complete    ?Transportation Screening Complete Complete Complete  ?PCP or Specialist Appt within 3-5 Days  Complete   ?Not Complete comments  patient already has an appointment schedules with [name]   ?[place] or Home Care Consult  Complete   ?Social Work Consult for [place] Planning/Counseling  Complete   ?Palliative Care Screening  Not Applicable   ?Medication Review Press photographer)  Complete   ?PCP or Specialist appointment within 3-5 days of discharge   Not Complete  ?PCP/Specialist Appt Not Complete comments   Pt had existing appt 5/23--this is first available per [facility]  ?[place] or Home Care Consult   Complete  ?SW Recovery Care/Counseling Consult   Complete  ?Palliative Care Screening   Complete  ?[place]   Not Applicable  ? ? ? ? ? ?

## 2021-05-10 NOTE — Discharge Summary (Addendum)
? ?Name: CHRIS CRIPPS ?MRN: [id number] ?DOB: Jun 02, 1960 61 y.o. ?PCP: [name], MD ? ?Date of Admission: 05/07/2021 11:06 AM ?Date of Discharge:   05/10/2021 ?Attending Physician: Dr. Daryll Drown ? ?Discharge Diagnosis: ?Principal Problem: ?  Sepsis ([place]) ?Active Problems: ?  Acute on chronic respiratory failure ([place]) ?  ? ?Discharge Medications: ?Allergies as of 05/10/2021   ?No Known Allergies ?  ? ?  ?Medication List  ?  ? ?STOP taking these medications   ? ?cefdinir 300 MG capsule ?Commonly known as: OMNICEF ?  ?pregabalin 75 MG capsule ?Commonly known as: Lyrica ?  ? ?  ? ?TAKE these medications   ? ?azithromycin 500 MG tablet ?Commonly known as: ZITHROMAX ?Take 1 tablet (500 mg total) by mouth daily. ?Start taking on: May 11, 2021 ?  ?Flutter Devi ?Use 4 times daily ?  ?furosemide 20 MG tablet ?Commonly known as: LASIX ?Take 1 tablet (20 mg total) by mouth daily as needed for edema. ?  ?glipiZIDE 5 MG 24 hr tablet ?Commonly known as: GLUCOTROL XL ?Take 1 tablet (5 mg total) by mouth daily with breakfast. ?  ?guaiFENesin 100 MG/5ML liquid ?Commonly known as: ROBITUSSIN ?Take 5 mLs by mouth every 4 (four) hours as needed for cough or to loosen phlegm. ?  ?losartan 50 MG tablet ?Commonly known as: COZAAR ?Take 1 tablet (50 mg total) by mouth daily. ?  ?metoprolol succinate 25 MG 24 hr tablet ?Commonly known as: TOPROL-XL ?Take 1 tablet (25 mg total) by mouth daily. ?  ?pantoprazole 40 MG tablet ?Commonly known as: PROTONIX ?Take 1 tablet (40 mg total) by mouth daily. ?  ?predniSONE 10 MG tablet ?Commonly known as: DELTASONE ?Take 4 tablets daily for 3 days then, starting May 11th, take 1 tablet daily. ?What changed: additional instructions ?  ?Trelegy Ellipta 100-62.5-25 MCG/ACT Aepb ?Generic drug: Fluticasone-Umeclidin-Vilant ?Inhale 1 puff into the lungs daily. ?  ?Ventolin HFA 108 (90 Base) MCG/ACT inhaler ?Generic drug: albuterol ?Inhale 2 puffs into the lungs every 4 (four) hours as needed. ?What changed:  Another medication with the same name was changed. Make sure you understand how and when to take each. ?  ?albuterol (2.5 MG/3ML) 0.083% nebulizer solution ?Commonly known as: PROVENTIL ?TAKE 3 MLS (2.5 MG TOTAL) BY NEBULIZATION IN THE MORNING, AT NOON, AND AT BEDTIME. ?What changed:  ?how much to take ?how to take this ?when to take this ?  ? ?  ? ? ?Disposition and follow-up:   ?Mr.SOLON ALBAN was discharged from [facility] in Stable condition.  At the hospital follow up visit please address: ? ?1.  Follow-up: ? a.  Sepsis 2/2 Community-acquired pneumonia: Patient was treated with IV cefepime and oral azithromycin and discharged home with 2 more days of azithromycin with improvement in his symptoms. Please reassess his respiratory symptoms and consider CT chest for further evaluation of recurrent pneumonias. ?  ? b.  COPD exacerbation: Likely secondary to pneumonia. Patient was started on the increased steroid dose with plan to taper back to his home regimen. Please ensure patient is adherent to this regimen. ? ? ?2.  Labs / imaging needed at time of follow-up: None ? ?3.  Pending labs/ test needing follow-up: None  ?Follow-up Appointments: ?Instructed to follow-up with PCP, [name] ? ?Hospital Course by problem list: ?This is a 61 year old male with history of COPD on 2L O2, recurrent pneumonia, HTN, T2DM, prior polysubstance use, and GERD who was admitted for sepsis and acute  respiratory failure due to community acquired pneumonia.  ? ?Sepsis secondary to community acquired pneumonia ?Patient initially presented with SOB and found to be febrile, tachycardic, and tachypneic with leukocytosis above baseline. Procalcitonin elevated. Lactic acid elevated but down-trended. Patient met SIRS criteria on admission but SOFA score was 0. Respiratory pathogen panel and UA negative. CXR was clear but patient is immunocompromised and thought to be unable to mount an immune response resulting in a  typical lobar consolidation. Urine strep pneumoniae antigen negative. Presentation most consistent with recurrence of community acquired pneumonia. Respiratory symptoms were mild and more consistent with atypical pneumonia but there was high concern for MDR organisms given immunocompromised state, chronic steroid use, and recurrent hospitalizations. He was treated with cefepime and azithromycin for coverage of typical, MDR, and atypical organisms. He was initially resuscitated with IVF per sepsis protocol but fluids were discontinued after 24 hours. Blood cultures remained negative.  He was discharged home to continue azithromycin for 2 more days. He will need further outpatient workup with a chest CT for evaluation of recurrent pneumonia.  ? ?Acute on chronic hypercarbic respiratory failure  ?COPD exacerbation ?Patient presented with shortness of breath and respiratory distress. Initial ABG with pH 7.33, pCO2 52.7, and pO2 145. Bicarb 28. Chronic compensated respiratory acidosis is secondary to air trapping with severe COPD. He required CPAP and BiPAP initially for work of breathing but subsequently weaned to 3L [place] without difficulty. Oxygen saturation remained adequate throughout admission. Respiratory failure thought to be secondary to pneumonia and COPD exacerbation. He was treated with 2 days of prednisone 40 mg daily, albuterol PRN, and daily breo ellipta / incruse ellipta to replace home Trelegy. His oxygen was weaned to home 2L 1 day before discharge and he was able to ambulate without oxygen desaturations.  He was discharged home to continue prednisone 40 mg for 3 more days then back to his home regimen of 10 mg daily.  He was also instructed to follow-up with his PCP/pulmonologist, [name]. ? ?  ?Subjective: ?Patient was evaluated at the bedside laying comfortably in bed. States his breathing has improved significantly. He denies any wheezing, chills, fevers, or chest pain. Does continue to have  occasional dry cough.  Patient on home O2 and agreeable to plan for discharge today. Patient instructed to follow-up with his PCP, [name].  ? ?Discharge Vitals:   ?BP (!) 140/95   Pulse 76   Temp 97.7 ?F (36.5 ?C) (Oral)   Resp 20   Ht _0  (1.753 m)   Wt 96.1 kg   SpO2 93%   BMI 31.29 kg/m?  ?Discharge exam: ?General: Chronically ill-appearing elderly male laying in bed.  No acute distress. ?CV: RRR. No murmurs, rubs, or gallops. No LE edema ?Pulmonary: On 2 L [place]. Decreased air movement throughout. Mild expiratory wheezes in all lung fields. No rales or rhonchi.  No increased WOB.  ?Abdominal: Soft, nontender, nondistended. Normal bowel sounds. ?Extremities: Palpable radial and DP pulses. Normal ROM. ?Skin: Warm and dry. No obvious rash or lesions. ?Neuro: A&Ox3. Moves all extremities. Normal sensation. No focal deficit. ?Psych: Normal mood and affect ? ?Pertinent Labs, Studies, and Procedures:  ? ?  Latest Ref Rng & Units 05/10/2021  ?  1:14 AM 05/09/2021  ?  7:39 AM 05/08/2021  ?  2:48 AM  ?CBC  ?WBC 4.0 - 10.5 K/uL 12.5   13.8   25.1    ?Hemoglobin 13.0 - 17.0 g/dL 12.4   12.0   12.4    ?  Hematocrit 39.0 - 52.0 % 37.8   38.4   37.8    ?Platelets 150 - 400 K/uL 261   249   258    ? ? ? ?  Latest Ref Rng & Units 05/09/2021  ?  7:39 AM 05/08/2021  ?  2:48 AM 05/07/2021  ? 12:42 PM  ?CMP  ?Glucose 70 - 99 mg/dL 96   127     ?BUN 6 - 20 mg/dL 18   13     ?Creatinine 0.61 - 1.24 mg/dL 0.79   0.79     ?Sodium 135 - 145 mmol/L 141   141   141    ?Potassium 3.5 - 5.1 mmol/L 4.3   4.5   3.7    ?Chloride 98 - 111 mmol/L 107   107     ?CO2 22 - 32 mmol/L 26   28     ?Calcium 8.9 - 10.3 mg/dL 8.9   8.9     ? ? ?DG Chest 2 View ? ?Result Date: 05/08/2021 ?CLINICAL DATA:  COPD EXAM: CHEST - 2 VIEW COMPARISON:  05/07/2021 FINDINGS: Stable cardiomediastinal contours. Aortic atherosclerosis. Progressive patchy bibasilar airspace opacities, right worse than left. Emphysematous changes more pronounced within the upper lung fields. No  pleural effusion or pneumothorax. Multiple old bilateral rib fractures. IMPRESSION: Progressive patchy bibasilar airspace opacities, right worse than left. Electronically Signed   By: Davina Poke D.O.   On: 05/08/2021

## 2021-05-10 NOTE — Discharge Instructions (Signed)
James [name],  ?It was a pleasure taking care of you at [place] were admitted for worsening shortness of breath and cough and treated for community-acquired pneumonia as well as COPD exacerbation. We are discharging you home now that you are doing better. Please follow the following instructions.  ?1) Continue taking azithromycin 500 mg daily for 2 days ?2) Take prednisone 40 mg daily for 3 days then 10 mg daily afterwards ?3) Continue taking your inhalers as prescribed and follow-up with [name] in 1 week ? ?Take care,  ?Dr. Linwood Dibbles, MD, MPH ? ?

## 2021-05-10 NOTE — Progress Notes (Signed)
Patient educated on any follow up appointments and on any changes in medication regimen . IV removed with no complaints. Personal belongings gathered and placed in personal items bag. No complaints or concerns stated. Patient transported to lobby via wheelchair ?

## 2021-05-11 ENCOUNTER — Other Ambulatory Visit: Payer: Self-pay | Admitting: *Deleted

## 2021-05-11 ENCOUNTER — Telehealth: Payer: Self-pay

## 2021-05-11 ENCOUNTER — Other Ambulatory Visit: Payer: Self-pay

## 2021-05-11 NOTE — Telephone Encounter (Signed)
Transition Care Management Follow-up Telephone Call ?Date of discharge and from where: 05/10/2021-[name]  ?How have you been since you were released from the hospital? Pt stated he is doing fine.  ?Any questions or concerns? No ? ?Items Reviewed: ?Did the pt receive and understand the discharge instructions provided? Yes  ?Medications obtained and verified? Yes  ?Other? No  ?Any new allergies since your discharge? No  ?Dietary orders reviewed? No ?Do you have support at home? Yes  ? ?Home Care and Equipment/Supplies: ?Were home health services ordered? not applicable ?If so, what is the name of the agency? N/A  ?Has the agency set up a time to come to the patient's home? not applicable ?Were any new equipment or medical supplies ordered?  No ?What is the name of the medical supply agency? N/A ?Were you able to get the supplies/equipment? not applicable ?Do you have any questions related to the use of the equipment or supplies? No ? ?Functional Questionnaire: (I = Independent and D = Dependent) ?ADLs: I ? ?Bathing/Dressing- I ? ?Meal Prep- I ? ?Eating- I ? ?Maintaining continence- I ? ?Transferring/Ambulation- I ? ?Managing Meds- I ? ?Follow up appointments reviewed: ? ?PCP Hospital f/u appt confirmed? No  . ?[place] [facility] f/u appt confirmed? No   ?Are transportation arrangements needed? No  ?If their condition worsens, is the pt aware to call PCP or go to the Emergency Dept.? Yes ?Was the patient provided with contact information for the PCP's office or ED? Yes ?Was to pt encouraged to call back with questions or concerns? Yes  ?

## 2021-05-11 NOTE — Telephone Encounter (Signed)
Transition Care Management Unsuccessful Follow-up Telephone Call ? ?Date of discharge and from where:  05/10/2021, [facility]  ? ?Attempts:  1st Attempt ? ?Reason for unsuccessful TCM follow-up call:  Voice mail full # [phone]. ? ?TOC call has been done but I called to schedule the patient with [name] for a hospital follow up appointment,  ? ? ? ?

## 2021-05-11 NOTE — Patient Instructions (Signed)
Visit Information ? ?James [name] was given information about Medicaid Managed Care team care coordination services as a part of their Healthy [facility] Medicaid benefit. James [name] verbally consented to engagement with the [facility] Managed Care team.  ? ?If you are experiencing a medical emergency, please call 911 or report to your local emergency department or urgent care.  ? ?If you have a non-emergency medical problem during routine business hours, please contact your provider's office and ask to speak with a nurse.  ? ?For questions related to your Healthy [facility] health plan, please call: [phone] or visit the homepage here: [email or web address] ? ?If you would like to schedule transportation through your Healthy [facility] plan, please call the following number at least 2 days in advance of your appointment: [phone] ? For information about your ride after you set it up, call Ride Assist at [phone]. Use this number to activate a Will Call pickup, or if your transportation is late for a scheduled pickup. Use this number, too, if you need to make a change or cancel a previously scheduled reservation. ? If you need transportation services right away, call [phone]. The after-hours call center is staffed 24 hours to handle ride assistance and urgent reservation requests (including discharges) 365 days a year. Urgent trips include sick visits, hospital discharge requests and life-sustaining treatment. ? ?Call the [place] at [phone], at any time, 24 hours a day, 7 days a week. If you are in danger or need immediate medical attention call 911. ? ?If you would like help to quit smoking, call 1-800-QUIT-NOW 2310484307) OR Espa?ol: 1-855-D?jelo-Ya (215)021-0210) o para m?s informaci?n haga clic aqu? or Text READY to 200-400 to register via text ? ?James [name], ? ? ?Please see education materials related to respiratory failure  provided as print materials.  ? ?The patient verbalized understanding of instructions, educational materials, and care plan provided today and agreed to receive a mailed copy of patient instructions, educational materials, and care plan.  ? ?Telephone follow up appointment with Managed Medicaid care management team member scheduled for:05/15/21 @ 10:30am ? ?James Joiner RN, BSN ?James [name] ?RN Care Coordinator ? ? ?Following is a copy of your plan of care:  ?Care Plan : [place] of Care  ?Updates made by [name], RN since 05/11/2021 12:00 AM  ?  ? ?Problem: Chronic Disease Management needs in patient with COPD   ?Priority: High  ?  ? ?Long-Range Goal: Plan of care established for for Chronic Disease management for patient with COPD   ?Start Date: 11/13/2020  ?Expected End Date: 05/15/2021  ?Priority: High  ?Note:   ?Current Barriers:  ?Care Coordination needs related to Limited access to food  ?Chronic Disease Management support and education needs related to COPD ?James [name] was admitted 05/07/21-05/10/21. He is home and feeling some better. He reports having oxygen, but does not have his discharge medications. He does not have transportation to pick up the medications. He has a follow up appointment scheduled with PCP on 06/29/21. ? ?RNCM Clinical Goal(s):  ?Patient will verbalize understanding of plan for management of COPD as evidenced by taking all medications as prescribed, attending all scheduled appointments and contacting provider with questions and concerns ?take all medications exactly as prescribed and will call provider for medication related questions as evidenced by refilling medications and taking as prescribed    ?attend all scheduled medical appointments: 03/31/21 at 2:45pm with Cardiology as evidenced by provider  notes in EMR        ?demonstrate improved adherence to prescribed treatment plan for COPD as evidenced by taking all medications as prescribed,  attending all scheduled appointments and contacting provider with questions and concerns ?continue to work with RN Care Manager and/or Social Worker to address care management and care coordination needs related to COPD as evidenced by adherence to CM Team Scheduled appointments     through collaboration with Consulting civil engineer, provider, and care team.  ? ?Interventions: ?Inter-disciplinary care team collaboration (see longitudinal plan of care) ?Evaluation of current treatment plan related to  self management and patient's adherence to plan as established by provider ?Collaborate with PCP office for earlier follow up appointment(06/08/21) ?Collaborate with [place] for discharge medication to be mailed ? ? ?SDOH Barriers (Status:  Goal on track:  Yes. ?Patient interviewed and SDOH assessment performed ?       ?SDOH Interventions   ? ?Flowsheet Row Most Recent Value  ?SDOH Interventions   ?Transportation Interventions Other (Comment)  [Assisted with arranging transportation via [place]  ? ?  ?Patient interviewed and appropriate assessments performed ?Discussed plans with patient for ongoing care management follow up and provided patient with direct contact information for care management team- ? ? ?COPD: (Status: Goal on Track (progressing): YES.) Long Term Goal  ?Reviewed medications with patient, including use of prescribed maintenance and rescue inhalers, and provided instruction on medication management and the importance of adherence ?Advised patient to use nebulizer as directed, three times daily ?Provided instruction about proper use of medications used for management of COPD including inhalers ?Provided education about and advised patient to utilize infection prevention strategies to reduce risk of respiratory infection ?Discussed the importance of adequate rest and management of fatigue with COPD ?Assessed social determinant of health barriers ?Advised patient to limit time outside with increased  pollen and rising temperatures ? ? ?Patient Goals/Self-Care Activities: ?Take medications as prescribed   ?Attend all scheduled provider appointments ?Call pharmacy for medication refills 3-7 days in advance of running out of medications ?Perform all self care activities independently  ?Call provider office for new concerns or questions  ?- avoid second hand smoke ?- limit outdoor activity during cold weather ?- keep follow-up appointments: 06/08/21 and 06/29/21 with [name] ?- eat healthy/prescribed diet: DASH ?- get at least 7 to 8 hours of sleep at night ? ? ?  ? ?  ?

## 2021-05-11 NOTE — Patient Outreach (Signed)
?Medicaid Managed Care   ?Nurse Care Manager Note ? ?05/11/2021 ?Name:  GRAVIEL PAYEUR MRN:  [id number] DOB:  [birth year] ? ?KYUSS HALE is an 61 y.o. year old male who is a primary patient of [name], Burnett Harry, MD.  The [facility] Managed Care Coordination team was consulted for assistance with:    ?COPD ? ?Mr. Due was given information about Medicaid Managed Care Coordination team services today. [name] Patient agreed to services and verbal consent obtained. ? ?Engaged with patient by telephone for follow up visit in response to provider referral for case management and/or care coordination services.  ? ?Assessments/Interventions:  Review of past medical history, allergies, medications, health status, including review of consultants reports, laboratory and other test data, was performed as part of comprehensive evaluation and provision of chronic care management services. ? ?SDOH (Social Determinants of Health) assessments and interventions performed: ? ? ?Care Plan ? ?No Known Allergies ? ?Medications Reviewed Today   ? ? Reviewed by [name], RN (Registered Nurse) on 05/11/21 at 1313  Med List Status: <None>  ? ?Medication Order Taking? Sig Documenting Provider Last Dose Status Informant  ?albuterol (PROVENTIL) (2.5 MG/3ML) 0.083% nebulizer solution [id number] Yes TAKE 3 MLS (2.5 MG TOTAL) BY NEBULIZATION IN THE MORNING, AT NOON, AND AT BEDTIME.  ?Patient taking differently: Take 2.5 mg by nebulization at bedtime.  ? [name], MD Taking Active Self  ?         ?Med Note Murvin Natal, [facility] D   Sat Feb 21, 2021 11:45 AM)    ?albuterol (VENTOLIN HFA) 108 (90 Base) MCG/ACT inhaler [id number] Yes Inhale 2 puffs into the lungs every 4 (four) hours as needed. [name], MD Taking Active Self  ?azithromycin (ZITHROMAX) 500 MG tablet [id number]  Take 1 tablet (500 mg total) by mouth daily. [name], MD  Active   ?         ?Med Note (Vesta [place] A   Mon May 11, 2021  1:10 PM) Needs to  pick up  ?Fluticasone-Umeclidin-Vilant (TRELEGY ELLIPTA) 100-62.5-25 MCG/ACT AEPB [id number] Yes Inhale 1 puff into the lungs daily. [name], MD Taking Active Self  ?furosemide (LASIX) 20 MG tablet [id number] Yes Take 1 tablet (20 mg total) by mouth daily as needed for edema. [name], MD Taking Active Self  ?glipiZIDE (GLUCOTROL XL) 5 MG 24 hr tablet [id number] Yes Take 1 tablet (5 mg total) by mouth daily with breakfast. [name], MD Taking Active Self  ?guaiFENesin (ROBITUSSIN) 100 MG/5ML liquid [id number]  Take 5 mLs by mouth every 4 (four) hours as needed for cough or to loosen phlegm. [name], MD  Active   ?         ?Med Note (Macie Baum A   Mon May 11, 2021  1:10 PM) Needs to pick up   ?losartan (COZAAR) 50 MG tablet [id number] Yes Take 1 tablet (50 mg total) by mouth daily. [name], MD Taking Active Self  ?metoprolol succinate (TOPROL-XL) 25 MG 24 hr tablet [id number] Yes Take 1 tablet (25 mg total) by mouth daily. [name], MD Taking Active Self  ?pantoprazole (PROTONIX) 40 MG tablet [id number] Yes Take 1 tablet (40 mg total) by mouth daily. [name], MD Taking Active Self  ?predniSONE (DELTASONE) 10 MG tablet [id number] Yes Take 4 tablets daily for 3 days then, starting May 11th, take 1 tablet daily. [name], MD Taking Active   ?Respiratory  Therapy Supplies (FLUTTER) DEVI [id number] Yes Use 4 times daily [name], MD Taking Active Self  ? ?  ?  ? ?  ? ? ?Patient Active Problem List  ? Diagnosis Date Noted  ? Sepsis ([place]) 05/07/2021  ? COPD with chronic bronchitis ([place]) 02/01/2021  ? GERD (gastroesophageal reflux disease) 02/01/2021  ? Neuropathy 09/29/2020  ? Bronchiectasis ([place]) 09/24/2019  ? Type 2 diabetes mellitus without complication, without long-term current use of insulin ([place]) 07/24/2019  ? Other atopic dermatitis 05/01/2019  ? Chronic respiratory failure with hypoxia ([place]) 04/23/2019  ? Intellectual disability  04/23/2019  ? History of alcohol use 11/23/2018  ? Hypertension 05/17/2016  ? History of tobacco abuse 04/06/2016  ? COPD mixed type ([place]) 03/26/2016  ? Acute on chronic respiratory failure ([place]) 06/08/2013  ? ? ?Conditions to be addressed/monitored per PCP order:  COPD ? ?Care Plan : RN Care Manager Plan of Care  ?Updates made by [name], RN since 05/11/2021 12:00 AM  ?  ? ?Problem: Chronic Disease Management needs in patient with COPD   ?Priority: High  ?  ? ?Long-Range Goal: Plan of care established for for Chronic Disease management for patient with COPD   ?Start Date: 11/13/2020  ?Expected End Date: 05/15/2021  ?Priority: High  ?Note:   ?Current Barriers:  ?Care Coordination needs related to Limited access to food  ?Chronic Disease Management support and education needs related to COPD ?Mr. Bracknell was admitted 05/07/21-05/10/21. He is home and feeling some better. He reports having oxygen, but does not have his discharge medications. He does not have transportation to pick up the medications. He has a follow up appointment scheduled with PCP on 06/29/21. ? ?RNCM Clinical Goal(s):  ?Patient will verbalize understanding of plan for management of COPD as evidenced by taking all medications as prescribed, attending all scheduled appointments and contacting provider with questions and concerns ?take all medications exactly as prescribed and will call provider for medication related questions as evidenced by refilling medications and taking as prescribed    ?attend all scheduled medical appointments: 03/31/21 at 2:45pm with Cardiology as evidenced by provider notes in EMR        ?demonstrate improved adherence to prescribed treatment plan for COPD as evidenced by taking all medications as prescribed, attending all scheduled appointments and contacting provider with questions and concerns ?continue to work with RN Care Manager and/or Social Worker to address care management and care coordination needs related to COPD  as evidenced by adherence to CM Team Scheduled appointments     through collaboration with Consulting civil engineer, provider, and care team.  ? ?Interventions: ?Inter-disciplinary care team collaboration (see longitudinal plan of care) ?Evaluation of current treatment plan related to  self management and patient's adherence to plan as established by provider ?Collaborate with PCP office for earlier follow up appointment(06/08/21) ?Collaborate with [place] for discharge medication to be mailed ? ? ?SDOH Barriers (Status:  Goal on track:  Yes. ?Patient interviewed and SDOH assessment performed ?       ?SDOH Interventions   ? ?Flowsheet Row Most Recent Value  ?SDOH Interventions   ?Transportation Interventions Other (Comment)  [Assisted with arranging transportation via [place]  ? ?  ?Patient interviewed and appropriate assessments performed ?Discussed plans with patient for ongoing care management follow up and provided patient with direct contact information for care management team- ? ? ?COPD: (Status: Goal on Track (progressing): YES.) Long Term Goal  ?Reviewed medications with patient, including use of  prescribed maintenance and rescue inhalers, and provided instruction on medication management and the importance of adherence ?Advised patient to use nebulizer as directed, three times daily ?Provided instruction about proper use of medications used for management of COPD including inhalers ?Provided education about and advised patient to utilize infection prevention strategies to reduce risk of respiratory infection ?Discussed the importance of adequate rest and management of fatigue with COPD ?Assessed social determinant of health barriers ?Advised patient to limit time outside with increased pollen and rising temperatures ? ? ?Patient Goals/Self-Care Activities: ?Take medications as prescribed   ?Attend all scheduled provider appointments ?Call pharmacy for medication refills 3-7 days in advance of running out  of medications ?Perform all self care activities independently  ?Call provider office for new concerns or questions  ?- avoid second hand smoke ?- limit outdoor activity during cold weather ?- keep follow-up

## 2021-05-12 ENCOUNTER — Other Ambulatory Visit: Payer: Self-pay

## 2021-05-12 ENCOUNTER — Other Ambulatory Visit: Payer: Self-pay | Admitting: *Deleted

## 2021-05-12 ENCOUNTER — Telehealth: Payer: Self-pay

## 2021-05-12 ENCOUNTER — Other Ambulatory Visit: Payer: Self-pay | Admitting: Pharmacist

## 2021-05-12 LAB — CULTURE, BLOOD (ROUTINE X 2)
Culture: NO GROWTH
Culture: NO GROWTH
Special Requests: ADEQUATE

## 2021-05-12 MED ORDER — AZITHROMYCIN 250 MG PO TABS
500.0000 mg | ORAL_TABLET | Freq: Every day | ORAL | 0 refills | Status: DC
  Filled 2021-05-12: qty 4, 2d supply, fill #0

## 2021-05-12 MED ORDER — ALBUTEROL SULFATE (2.5 MG/3ML) 0.083% IN NEBU
2.5000 mg | INHALATION_SOLUTION | Freq: Four times a day (QID) | RESPIRATORY_TRACT | 1 refills | Status: DC | PRN
  Filled 2021-05-12 – 2021-08-10 (×2): qty 270, 23d supply, fill #0

## 2021-05-12 NOTE — Telephone Encounter (Signed)
Albuterol neb med rx sent ?

## 2021-05-12 NOTE — Telephone Encounter (Signed)
I called and spoke to patient about his upcoming appointment with [name]  - 05/18/2021 @ 11430.  [name], RN/THN is arranging transportation for him to the clinic.  ?He was wheezing as I was speaking with him and had not done a nebulizer treatment yet. He will need more albuterol neb solution because he said he only has 3 treatments left.  ? ?The patient  has not taken his prednisone yet today. He said he will take it when he eats lunch. He understands that he is to take 40 mg daily for for more days and then decrease to 10 mg daily on 5/11/ 2023. He is very worried that 10 mg will not be enough.  I told him that [name] will be made aware of his concerns ? ?I contacted [place] who said that they do not have zithromax 500mg  in stock but they have have 250 mg available.  Acquanetta Belling Ausdall, RPH/CPP placed a new order for zithromax 250 mg and per Satya, the zithromax and albuterol neb solution can be sent out today.  ? ? ? ? ?

## 2021-05-12 NOTE — Telephone Encounter (Signed)
noted 

## 2021-05-12 NOTE — Patient Instructions (Signed)
Visit Information ? ?[name] was given information about Medicaid Managed Care team care coordination services as a part of their Healthy [facility] Medicaid benefit. James [name] verbally consented to engagement with the [facility] Managed Care team.  ? ?If you are experiencing a medical emergency, please call 911 or report to your local emergency department or urgent care.  ? ?If you have a non-emergency medical problem during routine business hours, please contact your provider's office and ask to speak with a nurse.  ? ?For questions related to your Healthy [facility] health plan, please call: [phone] or visit the homepage here: [email or web address] ? ?If you would like to schedule transportation through your Healthy [facility] plan, please call the following number at least 2 days in advance of your appointment: [phone] ? For information about your ride after you set it up, call Ride Assist at [phone]. Use this number to activate a Will Call pickup, or if your transportation is late for a scheduled pickup. Use this number, too, if you need to make a change or cancel a previously scheduled reservation. ? If you need transportation services right away, call [phone]. The after-hours call center is staffed 24 hours to handle ride assistance and urgent reservation requests (including discharges) 365 days a year. Urgent trips include sick visits, hospital discharge requests and life-sustaining treatment. ? ?Call the [facility] at [phone], at any time, 24 hours a day, 7 days a week. If you are in danger or need immediate medical attention call 911. ? ?If you would like help to quit smoking, call 1-800-QUIT-NOW (325)304-3334) OR Espa?ol: 1-855-D?jelo-Ya (301)051-3312) o para m?s informaci?n haga clic aqu? or Text READY to 200-400 to register via text ? ?[name], ? ? ?Please see education materials related to COPD provided as print  materials.  ? ?The patient verbalized understanding of instructions, educational materials, and care plan provided today and agreed to receive a mailed copy of patient instructions, educational materials, and care plan.  ? ?Telephone follow up appointment with Managed Medicaid care management team member scheduled for:05/15/21 @ 10:30am ? ?James Joiner RN, BSN ?[place] ?RN Care Coordinator ? ? ?Following is a copy of your plan of care:  ?Care Plan : [place] of Care  ?Updates made by James Montane, RN since 05/12/2021 12:00 AM  ?  ? ?Problem: Chronic Disease Management needs in patient with COPD   ?Priority: High  ?  ? ?Long-Range Goal: Plan of care established for for Chronic Disease management for patient with COPD   ?Start Date: 11/13/2020  ?Expected End Date: 05/15/2021  ?Priority: High  ?Note:   ?Current Barriers:  ?Care Coordination needs related to Limited access to food  ?Chronic Disease Management support and education needs related to COPD ?[name] was admitted 05/07/21-05/10/21.  He has a follow up appointment scheduled with PCP on 05/18/21 and needs transportation. ? ?RNCM Clinical Goal(s):  ?Patient will verbalize understanding of plan for management of COPD as evidenced by taking all medications as prescribed, attending all scheduled appointments and contacting provider with questions and concerns ?take all medications exactly as prescribed and will call provider for medication related questions as evidenced by refilling medications and taking as prescribed    ?attend all scheduled medical appointments: 05/18/21 with PCP/hospital follow up as evidenced by provider notes in EMR        ?demonstrate improved adherence to prescribed treatment plan for COPD as evidenced by taking all medications  as prescribed, attending all scheduled appointments and contacting provider with questions and concerns ?continue to work with RN Care Manager and/or Social Worker to address  care management and care coordination needs related to COPD as evidenced by adherence to CM Team Scheduled appointments     through collaboration with RN Care manager, provider, and care team.  ? ?Interventions: ?Inter-disciplinary care team collaboration (see longitudinal plan of care) ?Evaluation of current treatment plan related to  self management and patient's adherence to plan as established by provider ?Collaborate with PCP office, [name] and [place] for discharge medications-PCP office will adjust antibiotic dose, Pharmacy witll mail out medications today ?Arranged transportation with [employer] medical transportation, pick up on 05/18/21 @ 1:30pm, return pick up time 4pm, conf # [id number] ?Advised [name] to take prescribed dose of prednisone today and tomorrow(40mg ) and a nebulizer treatment this morning ? ? ? ? ?SDOH Barriers (Status:  Goal on track:  Yes. ?Patient interviewed and SDOH assessment performed ?       ? ? ?Patient interviewed and appropriate assessments performed ?Discussed plans with patient for ongoing care management follow up and provided patient with direct contact information for care management team- ? ? ?COPD: (Status: Goal on Track (progressing): YES.) Long Term Goal  ?Reviewed medications with patient, including use of prescribed maintenance and rescue inhalers, and provided instruction on medication management and the importance of adherence ?Advised patient to use nebulizer as directed, three times daily ?Provided instruction about proper use of medications used for management of COPD including inhalers ?Provided education about and advised patient to utilize infection prevention strategies to reduce risk of respiratory infection ?Discussed the importance of adequate rest and management of fatigue with COPD ?Assessed social determinant of health barriers ?Advised patient to limit time outside with increased pollen and rising temperatures ? ? ?Patient Goals/Self-Care  Activities: ?Take medications as prescribed   ?Attend all scheduled provider appointments ?Call pharmacy for medication refills 3-7 days in advance of running out of medications ?Perform all self care activities independently  ?Call provider office for new concerns or questions  ?- avoid second hand smoke ?- limit outdoor activity during cold weather ?- keep follow-up appointments: 06/08/21 and 06/29/21 with [name] ?- eat healthy/prescribed diet: DASH ?- get at least 7 to 8 hours of sleep at night ? ? ?  ? ?  ?

## 2021-05-15 ENCOUNTER — Other Ambulatory Visit: Payer: Self-pay | Admitting: *Deleted

## 2021-05-15 NOTE — Patient Instructions (Signed)
Visit Information ? ?James [name] was given information about Medicaid Managed Care team care coordination services as a part of their Healthy [facility] Medicaid benefit. James [name] verbally consented to engagement with the [facility] Managed Care team.  ? ?If you are experiencing a medical emergency, please call 911 or report to your local emergency department or urgent care.  ? ?If you have a non-emergency medical problem during routine business hours, please contact your provider's office and ask to speak with a nurse.  ? ?For questions related to your Healthy [facility] health plan, please call: [phone] or visit the homepage here: [email or web address] ? ?If you would like to schedule transportation through your Healthy [facility] plan, please call the following number at least 2 days in advance of your appointment: [phone] ? For information about your ride after you set it up, call Ride Assist at [phone]. Use this number to activate a Will Call pickup, or if your transportation is late for a scheduled pickup. Use this number, too, if you need to make a change or cancel a previously scheduled reservation. ? If you need transportation services right away, call [phone]. The after-hours call center is staffed 24 hours to handle ride assistance and urgent reservation requests (including discharges) 365 days a year. Urgent trips include sick visits, hospital discharge requests and life-sustaining treatment. ? ?Call the [place] at [phone], at any time, 24 hours a day, 7 days a week. If you are in danger or need immediate medical attention call 911. ? ?If you would like help to quit smoking, call 1-800-QUIT-NOW 7805024288) OR Espa?ol: 1-855-D?jelo-Ya 256-163-6434) o para m?s informaci?n haga clic aqu? or Text READY to 200-400 to register via text ? ?James [name], ? ? ?Please see education materials related to COPD provided as print  materials.  ? ?The patient verbalized understanding of instructions, educational materials, and care plan provided today and agreed to receive a mailed copy of patient instructions, educational materials, and care plan.  ? ?Telephone follow up appointment with Managed Medicaid care management team member scheduled for:06/05/21 @ 11:15am ? ?[name] RN, BSN ?[place] ?RN Care Coordinator ? ? ?Following is a copy of your plan of care:  ?Care Plan : [place] of Care  ?Updates made by [name], RN since 05/15/2021 12:00 AM  ?  ? ?Problem: Chronic Disease Management needs in patient with COPD   ?Priority: High  ?  ? ?Long-Range Goal: Plan of care established for for Chronic Disease management for patient with COPD   ?Start Date: 11/13/2020  ?Expected End Date: 06/05/2021  ?Priority: High  ?Note:   ?Current Barriers:  ?Care Coordination needs related to Limited access to food  ?Chronic Disease Management support and education needs related to COPD ?James [name] received prescribed discharge medications in the mail. His breathing is better today.  He is aware of the follow up appointment scheduled with PCP on 05/18/21. Transportation has been arranged. James [name] denies any needs today. ? ?RNCM Clinical Goal(s):  ?Patient will verbalize understanding of plan for management of COPD as evidenced by taking all medications as prescribed, attending all scheduled appointments and contacting provider with questions and concerns ?take all medications exactly as prescribed and will call provider for medication related questions as evidenced by refilling medications and taking as prescribed    ?attend all scheduled medical appointments: 05/18/21 with PCP/hospital follow up as evidenced by provider notes in EMR        ?  demonstrate improved adherence to prescribed treatment plan for COPD as evidenced by taking all medications as prescribed, attending all scheduled appointments and contacting  provider with questions and concerns ?continue to work with RN Care Manager and/or Social Worker to address care management and care coordination needs related to COPD as evidenced by adherence to CM Team Scheduled appointments     through collaboration with Consulting civil engineer, provider, and care team.  ? ?Interventions: ?Inter-disciplinary care team collaboration (see longitudinal plan of care) ?Evaluation of current treatment plan related to  self management and patient's adherence to plan as established by provider ?Reviewed transportation details with patient- [employer] medical transportation, pick up on 05/18/21 @ 1:30pm, return pick up time 4pm, conf # [id number] ?Advised James [name] request any needed refills, he can pick any needed medications up from pharmacy on 05/18/21-James [name] denied needing any medication currently ? ? ?SDOH Barriers (Status:  Goal on track:  Yes. ?Patient interviewed and SDOH assessment performed ?       ?SDOH Interventions   ? ?Flowsheet Row Most Recent Value  ?SDOH Interventions   ?Housing Interventions Intervention Not Indicated  ? ?  ? ?Patient interviewed and appropriate assessments performed ?Discussed plans with patient for ongoing care management follow up and provided patient with direct contact information for care management team- ? ? ?COPD: (Status: Goal on Track (progressing): YES.) Long Term Goal  ?Reviewed medications with patient, including use of prescribed maintenance and rescue inhalers, and provided instruction on medication management and the importance of adherence ?Advised patient to use nebulizer as directed, three times daily ?Provided instruction about proper use of medications used for management of COPD including inhalers ?Provided education about and advised patient to utilize infection prevention strategies to reduce risk of respiratory infection ?Discussed the importance of adequate rest and management of fatigue with COPD ?Assessed social determinant of health  barriers ?Advised patient to limit time outside with increased pollen and rising temperatures ? ? ?Patient Goals/Self-Care Activities: ?Take medications as prescribed   ?Attend all scheduled provider appointments ?Call pharmacy for medication refills 3-7 days in advance of running out of medications ?Perform all self care activities independently  ?Call provider office for new concerns or questions  ?- avoid second hand smoke ?- limit outdoor activity during cold weather ?- keep follow-up appointments: 05/18/21, 06/08/21 and 06/29/21 with [name] ?- eat healthy/prescribed diet: DASH ?- get at least 7 to 8 hours of sleep at night ? ? ?  ? ?  ?

## 2021-05-15 NOTE — Patient Outreach (Signed)
?Medicaid Managed Care   ?Nurse Care Manager Note ? ?05/15/2021 ?Name:  James [name] MRN:  [id number] DOB:  07/08/1960 ? ?James [name] is an 61 y.o. year old male who is a primary patient of James [name], James Harry, MD.  The [facility] Managed Care Coordination team was consulted for assistance with:    ?COPD ? ?James [name] was given information about Medicaid Managed Care Coordination team services today. James [name] Patient agreed to services and verbal consent obtained. ? ?Engaged with patient by telephone for follow up visit in response to provider referral for case management and/or care coordination services.  ? ?Assessments/Interventions:  Review of past medical history, allergies, medications, health status, including review of consultants reports, laboratory and other test data, was performed as part of comprehensive evaluation and provision of chronic care management services. ? ?SDOH (Social Determinants of Health) assessments and interventions performed: ?SDOH Interventions   ? ?Flowsheet Row Most Recent Value  ?SDOH Interventions   ?Housing Interventions Intervention Not Indicated  ? ?  ? ? ?Care Plan ? ?No Known Allergies ? ?Medications Reviewed Today   ? ? Reviewed by James Montane, RN (Registered Nurse) on 05/15/21 at 1108  Med List Status: <None>  ? ?Medication Order Taking? Sig Documenting Provider Last Dose Status Informant  ?albuterol (PROVENTIL) (2.5 MG/3ML) 0.083% nebulizer solution [id number] Yes Take 3 mLs (2.5 mg total) by nebulization every 6 (six) hours as needed for wheezing or shortness of breath. James Stain, MD Taking Active   ?albuterol (VENTOLIN HFA) 108 (90 Base) MCG/ACT inhaler [id number] Yes Inhale 2 puffs into the lungs every 4 (four) hours as needed. James Stain, MD Taking Active Self  ?azithromycin (ZITHROMAX) 250 MG tablet [id number] Yes Take 2 tablets (500 mg total) by mouth daily. James Stain, MD Taking Active   ?Fluticasone-Umeclidin-Vilant (TRELEGY ELLIPTA)  100-62.5-25 MCG/ACT AEPB [id number] Yes Inhale 1 puff into the lungs daily. James Stain, MD Taking Active Self  ?furosemide (LASIX) 20 MG tablet [id number] Yes Take 1 tablet (20 mg total) by mouth daily as needed for edema. James Stain, MD Taking Active Self  ?glipiZIDE (GLUCOTROL XL) 5 MG 24 hr tablet [id number] Yes Take 1 tablet (5 mg total) by mouth daily with breakfast. James Stain, MD Taking Active Self  ?guaiFENesin (ROBITUSSIN) 100 MG/5ML liquid [id number] No Take 5 mLs by mouth every 4 (four) hours as needed for cough or to loosen phlegm.  ?Patient not taking: Reported on 05/15/2021  ? James Axon, MD Not Taking Active   ?         ?Med Note (James [name] A   Mon May 11, 2021  1:10 PM) Needs to pick up   ?losartan (COZAAR) 50 MG tablet [id number] Yes Take 1 tablet (50 mg total) by mouth daily. James Stain, MD Taking Active Self  ?metoprolol succinate (TOPROL-XL) 25 MG 24 hr tablet [id number] Yes Take 1 tablet (25 mg total) by mouth daily. James Stain, MD Taking Active Self  ?pantoprazole (PROTONIX) 40 MG tablet [id number] Yes Take 1 tablet (40 mg total) by mouth daily. James Stain, MD Taking Active Self  ?predniSONE (DELTASONE) 10 MG tablet [id number] Yes Take 4 tablets daily for 3 days then, starting May 11th, take 1 tablet daily. James Axon, MD Taking Active   ?Respiratory Therapy Supplies (FLUTTER) DEVI [id number] Yes Use 4 times daily James Stain, MD Taking Active Self  ? ?  ?  ? ?  ? ? ?  Patient Active Problem List  ? Diagnosis Date Noted  ? Sepsis ([place]) 05/07/2021  ? COPD with chronic bronchitis ([place]) 02/01/2021  ? GERD (gastroesophageal reflux disease) 02/01/2021  ? Neuropathy 09/29/2020  ? Bronchiectasis ([place]) 09/24/2019  ? Type 2 diabetes mellitus without complication, without long-term current use of insulin ([place]) 07/24/2019  ? Other atopic dermatitis 05/01/2019  ? Chronic respiratory failure with hypoxia ([place]) 04/23/2019  ? Intellectual disability  04/23/2019  ? History of alcohol use 11/23/2018  ? Hypertension 05/17/2016  ? History of tobacco abuse 04/06/2016  ? COPD mixed type ([place]) 03/26/2016  ? Acute on chronic respiratory failure ([place]) 06/08/2013  ? ? ?Conditions to be addressed/monitored per PCP order:  COPD ? ?Care Plan : RN Care Manager Plan of Care  ?Updates made by James Montane, RN since 05/15/2021 12:00 AM  ?  ? ?Problem: Chronic Disease Management needs in patient with COPD   ?Priority: High  ?  ? ?Long-Range Goal: Plan of care established for for Chronic Disease management for patient with COPD   ?Start Date: 11/13/2020  ?Expected End Date: 06/05/2021  ?Priority: High  ?Note:   ?Current Barriers:  ?Care Coordination needs related to Limited access to food  ?Chronic Disease Management support and education needs related to COPD ?[name] received prescribed discharge medications in the mail. His breathing is better today.  He is aware of the follow up appointment scheduled with PCP on 05/18/21. Transportation has been arranged. [name] denies any needs today. ? ?RNCM Clinical Goal(s):  ?Patient will verbalize understanding of plan for management of COPD as evidenced by taking all medications as prescribed, attending all scheduled appointments and contacting provider with questions and concerns ?take all medications exactly as prescribed and will call provider for medication related questions as evidenced by refilling medications and taking as prescribed    ?attend all scheduled medical appointments: 05/18/21 with PCP/hospital follow up as evidenced by provider notes in EMR        ?demonstrate improved adherence to prescribed treatment plan for COPD as evidenced by taking all medications as prescribed, attending all scheduled appointments and contacting provider with questions and concerns ?continue to work with RN Care Manager and/or Social Worker to address care management and care coordination needs related to COPD as evidenced by adherence to  CM Team Scheduled appointments     through collaboration with Consulting civil engineer, provider, and care team.  ? ?Interventions: ?Inter-disciplinary care team collaboration (see longitudinal plan of care) ?Evaluation of current treatment plan related to  self management and patient's adherence to plan as established by provider ?Reviewed transportation details with patient- [employer] medical transportation, pick up on 05/18/21 @ 1:30pm, return pick up time 4pm, conf # [id number] ?Advised Mr. Ostrand request any needed refills, he can pick any needed medications up from pharmacy on 05/18/21-Mr. Burbano denied needing any medication currently ? ? ?SDOH Barriers (Status:  Goal on track:  Yes. ?Patient interviewed and SDOH assessment performed ?       ?SDOH Interventions   ? ?Flowsheet Row Most Recent Value  ?SDOH Interventions   ?Housing Interventions Intervention Not Indicated  ? ?  ? ?Patient interviewed and appropriate assessments performed ?Discussed plans with patient for ongoing care management follow up and provided patient with direct contact information for care management team- ? ? ?COPD: (Status: Goal on Track (progressing): YES.) Long Term Goal  ?Reviewed medications with patient, including use of prescribed maintenance and rescue inhalers, and provided instruction on medication management and  the importance of adherence ?Advised patient to use nebulizer as directed, three times daily ?Provided instruction about proper use of medications used for management of COPD including inhalers ?Provided education about and advised patient to utilize infection prevention strategies to reduce risk of respiratory infection ?Discussed the importance of adequate rest and management of fatigue with COPD ?Assessed social determinant of health barriers ?Advised patient to limit time outside with increased pollen and rising temperatures ? ? ?Patient Goals/Self-Care Activities: ?Take medications as prescribed   ?Attend all scheduled  provider appointments ?Call pharmacy for medication refills 3-7 days in advance of running out of medications ?Perform all self care activities independently  ?Call provider office for new concerns or question

## 2021-05-16 NOTE — Progress Notes (Signed)
? ?Established Patient Office Visit ? ?Subjective   ?Patient ID: James [name], male    DOB: 19-May-1960  Age: 61 y.o. MRN: [id number] ? ?Chief Complaint  ?Patient presents with  ? Hospitalization Follow-up  ? ? ?Patient seen in post hospital follow-up.  This is a transition of care visit less than 7 days from admission.  Patient was admitted between the fourth and 7 May with COPD respiratory failure and sepsis and recurrent pneumonia.  Patient still has persisting infiltrates right lower lobe.  Below is a copy of the discharge summary. ? ?Date of Admission: 05/07/2021 11:06 AM ?Date of Discharge:   05/10/2021 ?Attending Physician: Dr. Daryll Drown ?  ?Discharge Diagnosis: ?Principal Problem: ?  Sepsis ([place]) ?Active Problems: ?  Acute on chronic respiratory failure ([place]) ?  ?  ?  ?  ?Disposition and follow-up:   ?James [name] was discharged from [facility] in Stable condition.  At the hospital follow up visit please address: ?  ?1.  Follow-up: ?            a.  Sepsis 2/2 Community-acquired pneumonia: Patient was treated with IV cefepime and oral azithromycin and discharged home with 2 more days of azithromycin with improvement in his symptoms. Please reassess his respiratory symptoms and consider CT chest for further evaluation of recurrent pneumonias. ?             ?            b.  COPD exacerbation: Likely secondary to pneumonia. Patient was started on the increased steroid dose with plan to taper back to his home regimen. Please ensure patient is adherent to this regimen. ?  ?  ?2.  Labs / imaging needed at time of follow-up: None ?  ?3.  Pending labs/ test needing follow-up: None  ?Follow-up Appointments: ?Instructed to follow-up with PCP, [name] ?  ?Hospital Course by problem list: ?This is a 61 year old male with history of COPD on 2L O2, recurrent pneumonia, HTN, T2DM, prior polysubstance use, and GERD who was admitted for sepsis and acute respiratory failure due to community acquired  pneumonia.  ?  ?Sepsis secondary to community acquired pneumonia ?Patient initially presented with SOB and found to be febrile, tachycardic, and tachypneic with leukocytosis above baseline. Procalcitonin elevated. Lactic acid elevated but down-trended. Patient met SIRS criteria on admission but SOFA score was 0. Respiratory pathogen panel and UA negative. CXR was clear but patient is immunocompromised and thought to be unable to mount an immune response resulting in a typical lobar consolidation. Urine strep pneumoniae antigen negative. Presentation most consistent with recurrence of community acquired pneumonia. Respiratory symptoms were mild and more consistent with atypical pneumonia but there was high concern for MDR organisms given immunocompromised state, chronic steroid use, and recurrent hospitalizations. He was treated with cefepime and azithromycin for coverage of typical, MDR, and atypical organisms. He was initially resuscitated with IVF per sepsis protocol but fluids were discontinued after 24 hours. Blood cultures remained negative.  He was discharged home to continue azithromycin for 2 more days. He will need further outpatient workup with a chest CT for evaluation of recurrent pneumonia.  ?  ?Acute on chronic hypercarbic respiratory failure  ?COPD exacerbation ?Patient presented with shortness of breath and respiratory distress. Initial ABG with pH 7.33, pCO2 52.7, and pO2 145. Bicarb 28. Chronic compensated respiratory acidosis is secondary to air trapping with severe COPD. He required CPAP and BiPAP initially for work of breathing but  subsequently weaned to 3L [place] without difficulty. Oxygen saturation remained adequate throughout admission. Respiratory failure thought to be secondary to pneumonia and COPD exacerbation. He was treated with 2 days of prednisone 40 mg daily, albuterol PRN, and daily breo ellipta / incruse ellipta to replace home Trelegy. His oxygen was weaned to home 2L 1 day before  discharge and he was able to ambulate without oxygen desaturations.  He was discharged home to continue prednisone 40 mg for 3 more days then back to his home regimen of 10 mg daily.  He was also instructed to follow-up with his PCP/pulmonologist, [name]. ?  ?  ?Subjective: ?Patient was evaluated at the bedside laying comfortably in bed. States his breathing has improved significantly. He denies any wheezing, chills, fevers, or chest pain. Does continue to have occasional dry cough.  Patient on home O2 and agreeable to plan for discharge today. Patient instructed to follow-up with his PCP, [name].  ?  ?Since discharge the patient's dyspnea is at baseline.  He is tapered down on prednisone and finished his antibiotics.  He is using CPAP 2 L at bedtime.  He will need follow-up imaging of the chest.  Recommendation the hospital was made for CT of the chest. ? ?Past medical history reviewed no changes made ? ?Review of Systems  ?Constitutional:  Negative for chills, diaphoresis, fever, malaise/fatigue and weight loss.  ?HENT:  Negative for congestion, hearing loss, nosebleeds, sore throat and tinnitus.   ?Eyes:  Negative for blurred vision, photophobia and redness.  ?Respiratory:  Positive for cough. Negative for hemoptysis, sputum production, shortness of breath, wheezing and stridor.   ?Cardiovascular:  Negative for chest pain, palpitations, orthopnea, claudication, leg swelling and PND.  ?Gastrointestinal:  Negative for abdominal pain, blood in stool, constipation, diarrhea, heartburn, nausea and vomiting.  ?Genitourinary:  Negative for dysuria, flank pain, frequency, hematuria and urgency.  ?Musculoskeletal:  Negative for back pain, falls, joint pain, myalgias and neck pain.  ?Skin:  Negative for itching and rash.  ?Neurological:  Negative for dizziness, tingling, tremors, sensory change, speech change, focal weakness, seizures, loss of consciousness, weakness and headaches.  ?Endo/Heme/Allergies:  Negative  for environmental allergies and polydipsia. Does not bruise/bleed easily.  ?Psychiatric/Behavioral:  Negative for depression, memory loss, substance abuse and suicidal ideas. The patient is not nervous/anxious and does not have insomnia.   ? ?  ?Objective:  ?  ? ?BP 122/70   Pulse 100   Wt 201 lb 12.8 oz (91.5 kg)   SpO2 90%   BMI 29.80 kg/m?  ? ? ?Physical Exam ?Vitals reviewed.  ?Constitutional:   ?   Appearance: Normal appearance. He is well-developed. He is not diaphoretic.  ?HENT:  ?   Head: Normocephalic and atraumatic.  ?   Nose: No nasal deformity, septal deviation, mucosal edema or rhinorrhea.  ?   Right Sinus: No maxillary sinus tenderness or frontal sinus tenderness.  ?   Left Sinus: No maxillary sinus tenderness or frontal sinus tenderness.  ?   Mouth/Throat:  ?   Pharynx: No oropharyngeal exudate.  ?Eyes:  ?   General: No scleral icterus. ?   Conjunctiva/sclera: Conjunctivae normal.  ?   Pupils: Pupils are equal, round, and reactive to light.  ?Neck:  ?   Thyroid: No thyromegaly.  ?   Vascular: No carotid bruit or JVD.  ?   Trachea: Trachea normal. No tracheal tenderness or tracheal deviation.  ?Cardiovascular:  ?   Rate and Rhythm: Normal rate and regular rhythm.  ?  Chest Wall: PMI is not displaced.  ?   Pulses: Normal pulses. No decreased pulses.  ?   Heart sounds: Normal heart sounds, S1 normal and S2 normal. Heart sounds not distant. No murmur heard. ?No systolic murmur is present.  ?No diastolic murmur is present.  ?  No friction rub. No gallop. No S3 or S4 sounds.  ?Pulmonary:  ?   Effort: Pulmonary effort is normal. No tachypnea, accessory muscle usage or respiratory distress.  ?   Breath sounds: No stridor. No decreased breath sounds, wheezing, rhonchi or rales.  ?   Comments: Distant breath sounds ?Chest:  ?   Chest wall: No tenderness.  ?Abdominal:  ?   General: Bowel sounds are normal. There is no distension.  ?   Palpations: Abdomen is soft. Abdomen is not rigid.  ?   Tenderness: There  is no abdominal tenderness. There is no guarding or rebound.  ?Musculoskeletal:     ?   General: Normal range of motion.  ?   Cervical back: Normal range of motion and neck supple. No edema, erythema or rigidity.

## 2021-05-18 ENCOUNTER — Encounter: Payer: Self-pay | Admitting: Critical Care Medicine

## 2021-05-18 ENCOUNTER — Ambulatory Visit: Payer: Medicaid Other | Attending: Critical Care Medicine | Admitting: Critical Care Medicine

## 2021-05-18 VITALS — BP 122/70 | HR 100 | Wt 201.8 lb

## 2021-05-18 DIAGNOSIS — J9611 Chronic respiratory failure with hypoxia: Secondary | ICD-10-CM

## 2021-05-18 DIAGNOSIS — J189 Pneumonia, unspecified organism: Secondary | ICD-10-CM | POA: Diagnosis not present

## 2021-05-18 DIAGNOSIS — J9621 Acute and chronic respiratory failure with hypoxia: Secondary | ICD-10-CM | POA: Diagnosis not present

## 2021-05-18 DIAGNOSIS — J9622 Acute and chronic respiratory failure with hypercapnia: Secondary | ICD-10-CM

## 2021-05-18 DIAGNOSIS — J449 Chronic obstructive pulmonary disease, unspecified: Secondary | ICD-10-CM

## 2021-05-18 DIAGNOSIS — J479 Bronchiectasis, uncomplicated: Secondary | ICD-10-CM

## 2021-05-18 DIAGNOSIS — I10 Essential (primary) hypertension: Secondary | ICD-10-CM

## 2021-05-18 DIAGNOSIS — E119 Type 2 diabetes mellitus without complications: Secondary | ICD-10-CM | POA: Diagnosis not present

## 2021-05-18 MED ORDER — PREDNISONE 10 MG PO TABS: ORAL_TABLET | ORAL | 2 refills | Status: DC

## 2021-05-18 NOTE — Patient Instructions (Signed)
A CT scan will be obtained of your lung ? ?We need to get a urine sample for protein in the urine with your diabetes this will be obtained today at the lab ? ?Metabolic panel and blood counts were today at the lab ? ?No change in medications stay on prednisone 1 a day no additional antibiotics needed stay on inhalers as you are taking and stay on oxygen and CPAP as you are taking them ? ?Return to see [name] 2 months ?

## 2021-05-18 NOTE — Assessment & Plan Note (Signed)
Continue current medicine ?

## 2021-05-18 NOTE — Assessment & Plan Note (Signed)
This has resolved.

## 2021-05-18 NOTE — Assessment & Plan Note (Signed)
Continue nocturnal oxygen with CPAP 2 L ?

## 2021-05-18 NOTE — Assessment & Plan Note (Addendum)
Continue inhaled medications ? ?Reimage chest with CT ?

## 2021-05-18 NOTE — Assessment & Plan Note (Signed)
No further antibiotics ? ?Obtain CTA chest ?

## 2021-05-19 LAB — CBC WITH DIFFERENTIAL/PLATELET
Basophils Absolute: 0.1 10*3/uL (ref 0.0–0.2)
Basos: 1 %
EOS (ABSOLUTE): 0.1 10*3/uL (ref 0.0–0.4)
Eos: 1 %
Hematocrit: 43.3 % (ref 37.5–51.0)
Hemoglobin: 14.3 g/dL (ref 13.0–17.7)
Immature Grans (Abs): 0.1 10*3/uL (ref 0.0–0.1)
Immature Granulocytes: 1 %
Lymphocytes Absolute: 1.4 10*3/uL (ref 0.7–3.1)
Lymphs: 10 %
MCH: 29.8 pg (ref 26.6–33.0)
MCHC: 33 g/dL (ref 31.5–35.7)
MCV: 90 fL (ref 79–97)
Monocytes Absolute: 0.5 10*3/uL (ref 0.1–0.9)
Monocytes: 4 %
Neutrophils Absolute: 12.6 10*3/uL — ABNORMAL HIGH (ref 1.4–7.0)
Neutrophils: 83 %
Platelets: 349 10*3/uL (ref 150–450)
RBC: 4.8 x10E6/uL (ref 4.14–5.80)
RDW: 13.1 % (ref 11.6–15.4)
WBC: 14.8 10*3/uL — ABNORMAL HIGH (ref 3.4–10.8)

## 2021-05-19 LAB — COMPREHENSIVE METABOLIC PANEL
ALT: 21 IU/L (ref 0–44)
AST: 13 IU/L (ref 0–40)
Albumin/Globulin Ratio: 2.2 (ref 1.2–2.2)
Albumin: 4.8 g/dL (ref 3.8–4.9)
Alkaline Phosphatase: 73 IU/L (ref 44–121)
BUN/Creatinine Ratio: 16 (ref 10–24)
BUN: 14 mg/dL (ref 8–27)
Bilirubin Total: 0.3 mg/dL (ref 0.0–1.2)
CO2: 28 mmol/L (ref 20–29)
Calcium: 10.1 mg/dL (ref 8.6–10.2)
Chloride: 99 mmol/L (ref 96–106)
Creatinine, Ser: 0.85 mg/dL (ref 0.76–1.27)
Globulin, Total: 2.2 g/dL (ref 1.5–4.5)
Glucose: 116 mg/dL — ABNORMAL HIGH (ref 70–99)
Potassium: 5.2 mmol/L (ref 3.5–5.2)
Sodium: 142 mmol/L (ref 134–144)
Total Protein: 7 g/dL (ref 6.0–8.5)
eGFR: 99 mL/min/{1.73_m2} (ref >59)

## 2021-05-19 LAB — MICROALBUMIN / CREATININE URINE RATIO
Creatinine, Urine: 25 mg/dL
Microalb/Creat Ratio: <12 mg/g creat (ref 0–29)
Microalbumin, Urine: <3 ug/mL

## 2021-05-20 ENCOUNTER — Telehealth: Payer: Self-pay

## 2021-05-20 NOTE — Telephone Encounter (Signed)
Pt was called and is aware of results, DOB was confirmed.  ? ?Ct was also scheduled and pt is aware  ?

## 2021-05-20 NOTE — Telephone Encounter (Signed)
-----   Message from [name], MD sent at 05/19/2021  5:07 PM EDT ----- ?Let pt know all labs normal ?

## 2021-05-21 ENCOUNTER — Other Ambulatory Visit: Payer: Medicaid Other | Admitting: *Deleted

## 2021-05-21 DIAGNOSIS — Z515 Encounter for palliative care: Secondary | ICD-10-CM

## 2021-06-03 NOTE — Progress Notes (Signed)
[facility] COMMUNITY PALLIATIVE CARE RN NOTE  PATIENT NAME: James [name] DOB: [birth year] MRN: [id number]  PRIMARY CARE PROVIDER: [name], MD  RESPONSIBLE PARTY: [name] (sister) Acct ID - Guarantor Home Phone Work Phone Relationship Acct Type  [id number] - [name],[name]  Self P/F     [name], [place] [employer], [place] [address]   Due to the COVID-19 crisis, this virtual check-in visit was done via telephone from my office and it was initiated and consent by this patient and or family.  RN telephonic encounter completed with patient. He reports that he has been "doing alright." He reports pain in his legs currently taking Lyrica once daily but says it is not working. He says that his doctor is aware. He had a recent hospitalization from 05/07/21 to 05/10/21 due to sepsis from community-acquired pneumonia. He received IV and oral antibiotics and increased steroid dose. 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 Patient denies shortness of breath at this time and feels that his condition is stable. He continues with his nebulizer treatments and inhalers. He had no complaints/concerns at this time. Palliative care will continue to follow.   (Duration of visit and documentation 15 minutes)   Daryl Eastern, RN BSN

## 2021-06-05 ENCOUNTER — Other Ambulatory Visit: Payer: Self-pay | Admitting: *Deleted

## 2021-06-05 NOTE — Patient Outreach (Signed)
Medicaid Managed Care   Nurse Care Manager Note  06/05/2021 Name:  James [name] MRN:  [id number] DOB:  05-26-1960  James [name] is an 61 y.o. year old male who is a primary patient of [name], MD.  The [facility] Managed Care Coordination team was consulted for assistance with:    COPD  James [name] was given information about Medicaid Managed Care Coordination team services today. James [name] Patient agreed to services and verbal consent obtained.  Engaged with patient by telephone for follow up visit in response to provider referral for case management and/or care coordination services.   Assessments/Interventions:  Review of past medical history, allergies, medications, health status, including review of consultants reports, laboratory and other test data, was performed as part of comprehensive evaluation and provision of chronic care management services.  SDOH (Social Determinants of Health) assessments and interventions performed:   Care Plan  No Known Allergies  Medications Reviewed Today     Reviewed by James Montane, RN (Registered Nurse) on 06/05/21 at 36  Med List Status: <None>   Medication Order Taking? Sig Documenting Provider Last Dose Status Informant  albuterol (PROVENTIL) (2.5 MG/3ML) 0.083% nebulizer solution [id number] Yes Take 3 mLs (2.5 mg total) by nebulization every 6 (six) hours as needed for wheezing or shortness of breath. [name], MD Taking Active   albuterol (VENTOLIN HFA) 108 [phone] Base) MCG/ACT inhaler [id number] Yes Inhale 2 puffs into the lungs every 4 (four) hours as needed. [name], MD Taking Active Self  Fluticasone-Umeclidin-Vilant (TRELEGY ELLIPTA) 100-62.5-25 MCG/ACT AEPB [id number] No Inhale 1 puff into the lungs daily.  Patient not taking: Reported on 06/05/2021   [name], MD Not Taking Active Self  furosemide (LASIX) 20 MG tablet [id number] Yes Take 1 tablet (20 mg total) by mouth daily as needed for edema.  [name], MD Taking Active Self  glipiZIDE (GLUCOTROL XL) 5 MG 24 hr tablet [id number] Yes Take 1 tablet (5 mg total) by mouth daily with breakfast. [name], MD Taking Active Self  guaiFENesin (ROBITUSSIN) 100 MG/5ML liquid [id number] No Take 5 mLs by mouth every 4 (four) hours as needed for cough or to loosen phlegm.  Patient not taking: Reported on 06/05/2021   [name], MD Not Taking Active            Med Note (Kamareon Sciandra A   Mon May 11, 2021  1:10 PM) Needs to pick up   losartan (COZAAR) 50 MG tablet [id number] Yes Take 1 tablet (50 mg total) by mouth daily. [name], MD Taking Active Self  metoprolol succinate (TOPROL-XL) 25 MG 24 hr tablet [id number] Yes Take 1 tablet (25 mg total) by mouth daily. [name], MD Taking Active Self  pantoprazole (PROTONIX) 40 MG tablet [id number] Yes Take 1 tablet (40 mg total) by mouth daily. [name], MD Taking Active Self  predniSONE (DELTASONE) 10 MG tablet [id number] Yes Take 1 tablet daily. [name], MD Taking Active   Respiratory Therapy Supplies (FLUTTER) DEVI [id number] Yes Use 4 times daily [name], MD Taking Active Self            Patient Active Problem List   Diagnosis Date Noted   Recurrent pneumonia 02/20/2021   COPD with chronic bronchitis ([place]) 02/01/2021   GERD (gastroesophageal reflux disease) 02/01/2021   Neuropathy 09/29/2020   Bronchiectasis ([place]) 09/24/2019   Type 2 diabetes mellitus without complication, without long-term current  use of insulin ([place]) 07/24/2019   Other atopic dermatitis 05/01/2019   Chronic respiratory failure with hypoxia ([place]) 04/23/2019   Intellectual disability 04/23/2019   History of alcohol use 11/23/2018   Hypertension 05/17/2016   History of tobacco abuse 04/06/2016   COPD mixed type ([place]) 03/26/2016    Conditions to be addressed/monitored per PCP order:  COPD  Care Plan : RN Care Manager Plan of Care  Updates made by James Montane, RN since 06/05/2021 12:00 AM     Problem: Chronic Disease Management needs in patient with COPD   Priority: High     Long-Range Goal: Plan of care established for for Chronic Disease management for patient with COPD   Start Date: 11/13/2020  Expected End Date: 08/03/2021  Priority: High  Note:   Current Barriers:  Care Coordination needs related to Limited access to food  Chronic Disease Management support and education needs related to COPD James [name] is scheduled for CT on 6/7 and needs transportation. He is out of trelegy and plans to pick up this prescription on Monday.  RNCM Clinical Goal(s):  Patient will verbalize understanding of plan for management of COPD as evidenced by taking all medications as prescribed, attending all scheduled appointments and contacting provider with questions and concerns take all medications exactly as prescribed and will call provider for medication related questions as evidenced by refilling medications and taking as prescribed    attend all scheduled medical appointments: 6/7 for CT and 6/26 with PCP follow up as evidenced by provider notes in EMR        demonstrate improved adherence to prescribed treatment plan for COPD as evidenced by taking all medications as prescribed, attending all scheduled appointments and contacting provider with questions and concerns continue to work with RN Care Manager and/or Social Worker to address care management and care coordination needs related to COPD as evidenced by adherence to CM Team Scheduled appointments     through collaboration with Consulting civil engineer, provider, and care team.   Interventions: Inter-disciplinary care team collaboration (see longitudinal plan of care) Evaluation of current treatment plan related to  self management and patient's adherence to plan as established by provider Assisted with arranging transportation to upcoming appointments with patient- [employer] medical transportation,  pick up on 06/10/21 @ 8:15 am, return pick up time 10:20 am, conf # [id number] and pick up on 06/29/21 @ 9 am, return pick up time 11:30 am, conf # [phone] Advised James [name] request any needed refills, he can pick any needed medications up from pharmacy on 06/29/21   SDOH Barriers (Status:  Condition stable.  Not addressed this visit. Patient interviewed and SDOH assessment performed        SDOH Interventions    Flowsheet Row Most Recent Value  SDOH Interventions   Housing Interventions Intervention Not Indicated      Patient interviewed and appropriate assessments performed Discussed plans with patient for ongoing care management follow up and provided patient with direct contact information for care management team-   COPD: (Status: Goal on Track (progressing): YES.) Long Term Goal  Reviewed medications with patient, including use of prescribed maintenance and rescue inhalers, and provided instruction on medication management and the importance of adherence Advised patient to use nebulizer as directed, three times daily Provided education about and advised patient to utilize infection prevention strategies to reduce risk of respiratory infection Discussed the importance of adequate rest and management of fatigue with COPD Assessed social determinant of health  barriers Provided patient with COPD action zone education Advised patient to limit time outside with increased pollen and rising temperatures   Patient Goals/Self-Care Activities: Take medications as prescribed   Attend all scheduled provider appointments Call pharmacy for medication refills 3-7 days in advance of running out of medications Perform all self care activities independently  Call provider office for new concerns or questions  - avoid second hand smoke - limit outdoor activity during cold weather - keep follow-up appointments: 06/10/21 for CT and 06/29/21 with [name] - eat healthy/prescribed diet: DASH - get at least  7 to 8 hours of sleep at night       Follow Up:  Patient agrees to Care Plan and Follow-up.  Plan: The Managed Medicaid care management team will reach out to the patient again over the next 30 days.  Date/time of next scheduled RN care management/care coordination outreach:  07/09/21 @ 10:30am  James Joiner RN, BSN [place]on [place] RN Care Coordinator

## 2021-06-05 NOTE — Patient Instructions (Signed)
Visit Information  James [name] was given information about Medicaid Managed Care team care coordination services as a part of their Healthy [facility] Medicaid benefit. James [name] verbally consented to engagement with the [facility] Managed Care team.   If you are experiencing a medical emergency, please call 911 or report to your local emergency department or urgent care.   If you have a non-emergency medical problem during routine business hours, please contact your provider's office and ask to speak with a nurse.   For questions related to your Healthy [facility] health plan, please call: [phone] or visit the homepage here: [email or web address]  If you would like to schedule transportation through your Healthy [facility] plan, please call the following number at least 2 days in advance of your appointment: [phone]  For information about your ride after you set it up, call Ride Assist at [phone]. Use this number to activate a Will Call pickup, or if your transportation is late for a scheduled pickup. Use this number, too, if you need to make a change or cancel a previously scheduled reservation.  If you need transportation services right away, call [phone]. The after-hours call center is staffed 24 hours to handle ride assistance and urgent reservation requests (including discharges) 365 days a year. Urgent trips include sick visits, hospital discharge requests and life-sustaining treatment.  Call the [employer] at [phone], at any time, 24 hours a day, 7 days a week. If you are in danger or need immediate medical attention call 911.  If you would like help to quit smoking, call 1-800-QUIT-NOW (802)840-7990) OR Espaol: 1-855-Djelo-Ya (3-267-124-5809) o para ms informacin haga clic aqu or Text READY to 200-400 to register via text  James [name],   Please see education materials related to COPD provided as print  materials.   The patient verbalized understanding of instructions, educational materials, and care plan provided today and agreed to receive a mailed copy of patient instructions, educational materials, and care plan.   Telephone follow up appointment with Managed Medicaid care management team member scheduled for:07/09/21 @ 10:30am  [name] RN, BSN [place] RN Care Coordinator   Following is a copy of your plan of care:  Care Plan : RN Care Manager Plan of Care  Updates made by [name], RN since 06/05/2021 12:00 AM     Problem: Chronic Disease Management needs in patient with COPD   Priority: High     Long-Range Goal: Plan of care established for for Chronic Disease management for patient with COPD   Start Date: 11/13/2020  Expected End Date: 08/03/2021  Priority: High  Note:   Current Barriers:  Care Coordination needs related to Limited access to food  Chronic Disease Management support and education needs related to COPD James [name] is scheduled for CT on 6/7 and needs transportation. He is out of trelegy and plans to pick up this prescription on Monday.  RNCM Clinical Goal(s):  Patient will verbalize understanding of plan for management of COPD as evidenced by taking all medications as prescribed, attending all scheduled appointments and contacting provider with questions and concerns take all medications exactly as prescribed and will call provider for medication related questions as evidenced by refilling medications and taking as prescribed    attend all scheduled medical appointments: 6/7 for CT and 6/26 with PCP follow up as evidenced by provider notes in EMR        demonstrate improved adherence to prescribed treatment  plan for COPD as evidenced by taking all medications as prescribed, attending all scheduled appointments and contacting provider with questions and concerns continue to work with RN Care Manager and/or Social Worker to  address care management and care coordination needs related to COPD as evidenced by adherence to CM Team Scheduled appointments     through collaboration with RN Care manager, provider, and care team.   Interventions: Inter-disciplinary care team collaboration (see longitudinal plan of care) Evaluation of current treatment plan related to  self management and patient's adherence to plan as established by provider Assisted with arranging transportation to upcoming appointments with patient- [employer] medical transportation, pick up on 06/10/21 @ 8:15 am, return pick up time 10:20 am, conf # [id number] and pick up on 06/29/21 @ 9 am, return pick up time 11:30 am, conf # [phone] Advised James [name] request any needed refills, he can pick any needed medications up from pharmacy on 06/29/21   SDOH Barriers (Status:  Condition stable.  Not addressed this visit. Patient interviewed and SDOH assessment performed        SDOH Interventions    Flowsheet Row Most Recent Value  SDOH Interventions   Housing Interventions Intervention Not Indicated      Patient interviewed and appropriate assessments performed Discussed plans with patient for ongoing care management follow up and provided patient with direct contact information for care management team-   COPD: (Status: Goal on Track (progressing): YES.) Long Term Goal  Reviewed medications with patient, including use of prescribed maintenance and rescue inhalers, and provided instruction on medication management and the importance of adherence Advised patient to use nebulizer as directed, three times daily Provided education about and advised patient to utilize infection prevention strategies to reduce risk of respiratory infection Discussed the importance of adequate rest and management of fatigue with COPD Assessed social determinant of health barriers Provided patient with COPD action zone education Advised patient to limit time outside with increased  pollen and rising temperatures   Patient Goals/Self-Care Activities: Take medications as prescribed   Attend all scheduled provider appointments Call pharmacy for medication refills 3-7 days in advance of running out of medications Perform all self care activities independently  Call provider office for new concerns or questions  - avoid second hand smoke - limit outdoor activity during cold weather - keep follow-up appointments: 06/10/21 for CT and 06/29/21 with [name] - eat healthy/prescribed diet: DASH - get at least 7 to 8 hours of sleep at night

## 2021-06-06 DIAGNOSIS — J9601 Acute respiratory failure with hypoxia: Secondary | ICD-10-CM | POA: Diagnosis not present

## 2021-06-06 DIAGNOSIS — F79 Unspecified intellectual disabilities: Secondary | ICD-10-CM | POA: Diagnosis not present

## 2021-06-06 DIAGNOSIS — J9621 Acute and chronic respiratory failure with hypoxia: Secondary | ICD-10-CM | POA: Diagnosis not present

## 2021-06-08 ENCOUNTER — Inpatient Hospital Stay: Payer: Medicaid Other | Admitting: Critical Care Medicine

## 2021-06-09 ENCOUNTER — Other Ambulatory Visit: Payer: Self-pay

## 2021-06-10 ENCOUNTER — Ambulatory Visit
Admission: RE | Admit: 2021-06-10 | Discharge: 2021-06-10 | Disposition: A | Discharge status: Home / Self Care | Payer: Medicaid Other | Source: Ambulatory Visit | Attending: Critical Care Medicine | Admitting: Critical Care Medicine

## 2021-06-10 ENCOUNTER — Other Ambulatory Visit: Payer: Self-pay

## 2021-06-10 DIAGNOSIS — R911 Solitary pulmonary nodule: Secondary | ICD-10-CM | POA: Diagnosis not present

## 2021-06-10 DIAGNOSIS — J439 Emphysema, unspecified: Secondary | ICD-10-CM | POA: Diagnosis not present

## 2021-06-10 DIAGNOSIS — J189 Pneumonia, unspecified organism: Secondary | ICD-10-CM

## 2021-06-10 DIAGNOSIS — J479 Bronchiectasis, uncomplicated: Secondary | ICD-10-CM

## 2021-06-10 DIAGNOSIS — I700 Atherosclerosis of aorta: Secondary | ICD-10-CM | POA: Diagnosis not present

## 2021-06-11 ENCOUNTER — Other Ambulatory Visit: Payer: Self-pay | Admitting: Critical Care Medicine

## 2021-06-11 DIAGNOSIS — R918 Other nonspecific abnormal finding of lung field: Secondary | ICD-10-CM | POA: Insufficient documentation

## 2021-06-11 NOTE — Progress Notes (Signed)
Patient aware of results Patient has new lung nodules we will refer him back over to pulmonary medicine attention Dr. Valeta Harms

## 2021-06-22 ENCOUNTER — Other Ambulatory Visit: Payer: Self-pay | Admitting: *Deleted

## 2021-06-22 ENCOUNTER — Other Ambulatory Visit: Payer: Self-pay

## 2021-06-22 NOTE — Patient Outreach (Signed)
Medicaid Managed Care   Nurse Care Manager Note  06/22/2021 Name:  James [name] MRN:  [id number] DOB:  [birth year]  James [name] is an 61 y.o. year old male who is a primary patient of James Gaskins Burnett Harry, MD.  The [facility] Managed Care Coordination team was consulted for assistance with:    COPD  James [name] was given information about Medicaid Managed Care Coordination team services today. James [name] Patient agreed to services and verbal consent obtained.  Engaged with patient by telephone for follow up visit in response to provider referral for case management and/or care coordination services.   Assessments/Interventions:  Review of past medical history, allergies, medications, health status, including review of consultants reports, laboratory and other test data, was performed as part of comprehensive evaluation and provision of chronic care management services.  SDOH (Social Determinants of Health) assessments and interventions performed:   Care Plan  No Known Allergies  Medications Reviewed Today     Reviewed by [name], RN (Registered Nurse) on 06/05/21 at 61  Med List Status: <None>   Medication Order Taking? Sig Documenting Provider Last Dose Status Informant  albuterol (PROVENTIL) (2.5 MG/3ML) 0.083% nebulizer solution [id number] Yes Take 3 mLs (2.5 mg total) by nebulization every 6 (six) hours as needed for wheezing or shortness of breath. James Stain, MD Taking Active   albuterol (VENTOLIN HFA) 108 [phone] Base) MCG/ACT inhaler [id number] Yes Inhale 2 puffs into the lungs every 4 (four) hours as needed. James Stain, MD Taking Active Self  Fluticasone-Umeclidin-Vilant (TRELEGY ELLIPTA) 100-62.5-25 MCG/ACT AEPB [id number] No Inhale 1 puff into the lungs daily.  Patient not taking: Reported on 06/05/2021   James Stain, MD Not Taking Active Self  furosemide (LASIX) 20 MG tablet [id number] Yes Take 1 tablet (20 mg total) by mouth daily as needed for edema.  James Stain, MD Taking Active Self  glipiZIDE (GLUCOTROL XL) 5 MG 24 hr tablet [id number] Yes Take 1 tablet (5 mg total) by mouth daily with breakfast. James Stain, MD Taking Active Self  guaiFENesin (ROBITUSSIN) 100 MG/5ML liquid [id number] No Take 5 mLs by mouth every 4 (four) hours as needed for cough or to loosen phlegm.  Patient not taking: Reported on 06/05/2021   James Axon, MD Not Taking Active            Med Note (James [name] A   Mon May 11, 2021  1:10 PM) Needs to pick up   losartan (COZAAR) 50 MG tablet [id number] Yes Take 1 tablet (50 mg total) by mouth daily. James Stain, MD Taking Active Self  metoprolol succinate (TOPROL-XL) 25 MG 24 hr tablet [id number] Yes Take 1 tablet (25 mg total) by mouth daily. James Stain, MD Taking Active Self  pantoprazole (PROTONIX) 40 MG tablet [id number] Yes Take 1 tablet (40 mg total) by mouth daily. James Stain, MD Taking Active Self  predniSONE (DELTASONE) 10 MG tablet [id number] Yes Take 1 tablet daily. James Stain, MD Taking Active   Respiratory Therapy Supplies (FLUTTER) DEVI [id number] Yes Use 4 times daily James Stain, MD Taking Active Self            Patient Active Problem List   Diagnosis Date Noted   Multiple lung nodules on CT 06/11/2021   Recurrent pneumonia 02/20/2021   COPD with chronic bronchitis ([place]) 02/01/2021   GERD (gastroesophageal reflux disease) 02/01/2021   Neuropathy 09/29/2020   Bronchiectasis ([place]) 09/24/2019   Type  2 diabetes mellitus without complication, without long-term current use of insulin ([place]) 07/24/2019   Other atopic dermatitis 05/01/2019   Chronic respiratory failure with hypoxia ([place]) 04/23/2019   Intellectual disability 04/23/2019   History of alcohol use 11/23/2018   Hypertension 05/17/2016   History of tobacco abuse 04/06/2016   COPD mixed type ([place]) 03/26/2016    Conditions to be addressed/monitored per PCP order:  COPD  Care Plan : RN Care  Manager Plan of Care  Updates made by [name], RN since 06/22/2021 12:00 AM     Problem: Chronic Disease Management needs in patient with COPD   Priority: High     Long-Range Goal: Plan of care established for for Chronic Disease management for patient with COPD   Start Date: 11/13/2020  Expected End Date: 08/03/2021  Priority: High  Note:   Current Barriers:  Care Coordination needs related to Limited access to food  Chronic Disease Management support and education needs related to COPD James [name] is scheduled for PCP appointment on 06/29/21. He is requesting medication for pain in legs and feet.   RNCM Clinical Goal(s):  Patient will verbalize understanding of plan for management of COPD as evidenced by taking all medications as prescribed, attending all scheduled appointments and contacting provider with questions and concerns take all medications exactly as prescribed and will call provider for medication related questions as evidenced by refilling medications and taking as prescribed    attend all scheduled medical appointments: 6/7 for CT and 6/26 with PCP follow up as evidenced by provider notes in EMR        demonstrate improved adherence to prescribed treatment plan for COPD as evidenced by taking all medications as prescribed, attending all scheduled appointments and contacting provider with questions and concerns continue to work with RN Care Manager and/or Social Worker to address care management and care coordination needs related to COPD as evidenced by adherence to CM Team Scheduled appointments     through collaboration with Consulting civil engineer, provider, and care team.   Interventions: Inter-disciplinary care team collaboration (see longitudinal plan of care) Evaluation of current treatment plan related to  self management and patient's adherence to plan as established by provider Assisted with arranging transportation to upcoming appointments with patient- [facility]  medical transportation,  pick up on 06/29/21 @ 9 am, return pick up time 11:30 am, conf # [id number]-reminded patient of transportation details Collaborated with PCP office for Lyrica refill-this medication was marked for discontinue during ED visit in May, per progress note he is to continue taking   SDOH Barriers (Status:  Condition stable.  Not addressed this visit. Patient interviewed and SDOH assessment performed        SDOH Interventions    Flowsheet Row Most Recent Value  SDOH Interventions   Housing Interventions Intervention Not Indicated      Patient interviewed and appropriate assessments performed Discussed plans with patient for ongoing care management follow up and provided patient with direct contact information for care management team-   COPD: (Status: Goal on Track (progressing): YES.) Long Term Goal  Reviewed medications with patient, including use of prescribed maintenance and rescue inhalers, and provided instruction on medication management and the importance of adherence Advised patient to use nebulizer as directed, three times daily Provided education about and advised patient to utilize infection prevention strategies to reduce risk of respiratory infection Discussed the importance of adequate rest and management of fatigue with COPD Assessed social determinant of health barriers Provided  patient with COPD action zone education Advised patient to limit time outside with increased pollen and rising temperatures   Patient Goals/Self-Care Activities: Take medications as prescribed   Attend all scheduled provider appointments Call pharmacy for medication refills 3-7 days in advance of running out of medications Perform all self care activities independently  Call provider office for new concerns or questions  - avoid second hand smoke - limit outdoor activity during cold weather - keep follow-up appointments: 06/10/21 for CT and 06/29/21 with [name] - eat  healthy/prescribed diet: DASH - get at least 7 to 8 hours of sleep at night       Follow Up:  Patient agrees to Care Plan and Follow-up.  Plan: The Managed Medicaid care management team will reach out to the patient again over the next 14 days.  Date/time of next scheduled RN care management/care coordination outreach:  07/09/21 @ 10:30am  [name] RN, BSN [place] RN Care Coordinator

## 2021-06-22 NOTE — Patient Outreach (Signed)
Medicaid Managed Care   Nurse Care Manager Note  06/22/2021 Name:  James [name] MRN:  [id number] DOB:  12-30-1960  James [name] is an 61 y.o. year old male who is a primary patient of James Gaskins Burnett Harry, MD.  The [facility] Managed Care Coordination team was consulted for assistance with:    COPD  James [name] was given information about Medicaid Managed Care Coordination team services today. James [name] Patient agreed to services and verbal consent obtained.  Engaged with patient by telephone for follow up visit in response to provider referral for case management and/or care coordination services.   Assessments/Interventions:  Review of past medical history, allergies, medications, health status, including review of consultants reports, laboratory and other test data, was performed as part of comprehensive evaluation and provision of chronic care management services.  SDOH (Social Determinants of Health) assessments and interventions performed:   Care Plan  No Known Allergies  Medications Reviewed Today     Reviewed by [name], RN (Registered Nurse) on 06/05/21 at 72  Med List Status: <None>   Medication Order Taking? Sig Documenting Provider Last Dose Status Informant  albuterol (PROVENTIL) (2.5 MG/3ML) 0.083% nebulizer solution [id number] Yes Take 3 mLs (2.5 mg total) by nebulization every 6 (six) hours as needed for wheezing or shortness of breath. James Stain, MD Taking Active   albuterol (VENTOLIN HFA) 108 [phone] Base) MCG/ACT inhaler [id number] Yes Inhale 2 puffs into the lungs every 4 (four) hours as needed. James Stain, MD Taking Active Self  Fluticasone-Umeclidin-Vilant (TRELEGY ELLIPTA) 100-62.5-25 MCG/ACT AEPB [id number] No Inhale 1 puff into the lungs daily.  Patient not taking: Reported on 06/05/2021   James Stain, MD Not Taking Active Self  furosemide (LASIX) 20 MG tablet [id number] Yes Take 1 tablet (20 mg total) by mouth daily as needed for edema.  James Stain, MD Taking Active Self  glipiZIDE (GLUCOTROL XL) 5 MG 24 hr tablet [id number] Yes Take 1 tablet (5 mg total) by mouth daily with breakfast. James Stain, MD Taking Active Self  guaiFENesin (ROBITUSSIN) 100 MG/5ML liquid [id number] No Take 5 mLs by mouth every 4 (four) hours as needed for cough or to loosen phlegm.  Patient not taking: Reported on 06/05/2021   James Axon, MD Not Taking Active            Med Note (James [name] A   Mon May 11, 2021  1:10 PM) Needs to pick up   losartan (COZAAR) 50 MG tablet [id number] Yes Take 1 tablet (50 mg total) by mouth daily. James Stain, MD Taking Active Self  metoprolol succinate (TOPROL-XL) 25 MG 24 hr tablet [id number] Yes Take 1 tablet (25 mg total) by mouth daily. James Stain, MD Taking Active Self  pantoprazole (PROTONIX) 40 MG tablet [id number] Yes Take 1 tablet (40 mg total) by mouth daily. James Stain, MD Taking Active Self  predniSONE (DELTASONE) 10 MG tablet [id number] Yes Take 1 tablet daily. James Stain, MD Taking Active   Respiratory Therapy Supplies (FLUTTER) DEVI [id number] Yes Use 4 times daily James Stain, MD Taking Active Self            Patient Active Problem List   Diagnosis Date Noted   Multiple lung nodules on CT 06/11/2021   Recurrent pneumonia 02/20/2021   COPD with chronic bronchitis ([place]) 02/01/2021   GERD (gastroesophageal reflux disease) 02/01/2021   Neuropathy 09/29/2020   Bronchiectasis ([place]) 09/24/2019   Type  2 diabetes mellitus without complication, without long-term current use of insulin ([place]) 07/24/2019   Other atopic dermatitis 05/01/2019   Chronic respiratory failure with hypoxia ([place]) 04/23/2019   Intellectual disability 04/23/2019   History of alcohol use 11/23/2018   Hypertension 05/17/2016   History of tobacco abuse 04/06/2016   COPD mixed type ([place]) 03/26/2016    Conditions to be addressed/monitored per PCP order:  COPD  Care Plan : RN Care  Manager Plan of Care  Updates made by [name], RN since 06/22/2021 12:00 AM     Problem: Chronic Disease Management needs in patient with COPD   Priority: High     Long-Range Goal: Plan of care established for for Chronic Disease management for patient with COPD   Start Date: 11/13/2020  Expected End Date: 08/03/2021  Priority: High  Note:   Current Barriers:  Care Coordination needs related to Limited access to food  Chronic Disease Management support and education needs related to COPD James [name] is scheduled for PCP appointment on 06/29/21. He is requesting medication for pain in legs and feet. James [name] left message for [facility] stating that he can not get in touch with the pharmacy to request needed refills.  RNCM Clinical Goal(s):  Patient will verbalize understanding of plan for management of COPD as evidenced by taking all medications as prescribed, attending all scheduled appointments and contacting provider with questions and concerns take all medications exactly as prescribed and will call provider for medication related questions as evidenced by refilling medications and taking as prescribed    attend all scheduled medical appointments: 6/7 for CT and 6/26 with PCP follow up as evidenced by provider notes in EMR        demonstrate improved adherence to prescribed treatment plan for COPD as evidenced by taking all medications as prescribed, attending all scheduled appointments and contacting provider with questions and concerns continue to work with RN Care Manager and/or Social Worker to address care management and care coordination needs related to COPD as evidenced by adherence to CM Team Scheduled appointments     through collaboration with Consulting civil engineer, provider, and care team.   Interventions: Inter-disciplinary care team collaboration (see longitudinal plan of care) Evaluation of current treatment plan related to  self management and patient's adherence to plan as  established by provider Assisted with arranging transportation to upcoming appointments with patient- [facility] medical transportation,  pick up on 06/29/21 @ 9 am, return pick up time 11:30 am, conf # [id number]-reminded patient of transportation details Collaborated with PCP office for Lyrica refill-this medication was marked for discontinue during ED visit in May, per progress note he is to continue taking RNCM returned call to patient, received list of needed medications, made call to pharmacy to request for needed refills   SDOH Barriers (Status:  Condition stable.  Not addressed this visit. Patient interviewed and SDOH assessment performed        SDOH Interventions    Flowsheet Row Most Recent Value  SDOH Interventions   Housing Interventions Intervention Not Indicated      Patient interviewed and appropriate assessments performed Discussed plans with patient for ongoing care management follow up and provided patient with direct contact information for care management team-   COPD: (Status: Goal on Track (progressing): YES.) Long Term Goal  Reviewed medications with patient, including use of prescribed maintenance and rescue inhalers, and provided instruction on medication management and the importance of adherence Advised patient to use nebulizer as directed,  three times daily Provided education about and advised patient to utilize infection prevention strategies to reduce risk of respiratory infection Discussed the importance of adequate rest and management of fatigue with COPD Assessed social determinant of health barriers Provided patient with COPD action zone education Advised patient to limit time outside with increased pollen and rising temperatures   Patient Goals/Self-Care Activities: Take medications as prescribed   Attend all scheduled provider appointments Call pharmacy for medication refills 3-7 days in advance of running out of medications Perform all self care  activities independently  Call provider office for new concerns or questions  - avoid second hand smoke - limit outdoor activity during cold weather - keep follow-up appointments: 06/10/21 for CT and 06/29/21 with [name] - eat healthy/prescribed diet: DASH - get at least 7 to 8 hours of sleep at night       Follow Up:  Patient agrees to Care Plan and Follow-up.  Plan: The Managed Medicaid care management team will reach out to the patient again over the next 14 days.  Date/time of next scheduled RN care management/care coordination outreach:  07/09/21 @ 10:30am  [name] RN, BSN [place] RN Care Coordinator

## 2021-06-22 NOTE — Patient Instructions (Signed)
Visit Information  James [name] was given information about Medicaid Managed Care team care coordination services as a part of their Healthy [facility] Medicaid benefit. [name] verbally consented to engagement with the [facility] Managed Care team.   If you are experiencing a medical emergency, please call 911 or report to your local emergency department or urgent care.   If you have a non-emergency medical problem during routine business hours, please contact your provider's office and ask to speak with a nurse.   For questions related to your [facility] health plan, please call: [phone] or visit the homepage here: [email or web address]  If you would like to schedule transportation through your Healthy [facility] plan, please call the following number at least 2 days in advance of your appointment: [phone]  For information about your ride after you set it up, call Ride Assist at [phone]. Use this number to activate a Will Call pickup, or if your transportation is late for a scheduled pickup. Use this number, too, if you need to make a change or cancel a previously scheduled reservation.  If you need transportation services right away, call [phone]. The after-hours call center is staffed 24 hours to handle ride assistance and urgent reservation requests (including discharges) 365 days a year. Urgent trips include sick visits, hospital discharge requests and life-sustaining treatment.  Call the [place] at [phone], at any time, 24 hours a day, 7 days a week. If you are in danger or need immediate medical attention call 911.  If you would like help to quit smoking, call 1-800-QUIT-NOW 715 342 9099) OR Espaol: 1-855-Djelo-Ya (3-710-626-9485) o para ms informacin haga clic aqu or Text READY to 200-400 to register via text  [name],   Please see education materials related to COPD provided as print  materials.   The patient verbalized understanding of instructions, educational materials, and care plan provided today and agreed to receive a mailed copy of patient instructions, educational materials, and care plan.   Telephone follow up appointment with Managed Medicaid care management team member scheduled for:07/09/21 @ 10:30am  [name] RN, BSN [place] RN Care Coordinator   Following is a copy of your plan of care:  Care Plan : RN Care Manager Plan of Care  Updates made by [name], RN since 06/22/2021 12:00 AM     Problem: Chronic Disease Management needs in patient with COPD   Priority: High     Long-Range Goal: Plan of care established for for Chronic Disease management for patient with COPD   Start Date: 11/13/2020  Expected End Date: 08/03/2021  Priority: High  Note:   Current Barriers:  Care Coordination needs related to Limited access to food  Chronic Disease Management support and education needs related to COPD Mr. [name] is scheduled for PCP appointment on 06/29/21. He is requesting medication for pain in legs and feet.   RNCM Clinical Goal(s):  Patient will verbalize understanding of plan for management of COPD as evidenced by taking all medications as prescribed, attending all scheduled appointments and contacting provider with questions and concerns take all medications exactly as prescribed and will call provider for medication related questions as evidenced by refilling medications and taking as prescribed    attend all scheduled medical appointments: 6/7 for CT and 6/26 with PCP follow up as evidenced by provider notes in EMR        demonstrate improved adherence to prescribed treatment plan for COPD as evidenced  by taking all medications as prescribed, attending all scheduled appointments and contacting provider with questions and concerns continue to work with RN Care Manager and/or Social Worker to address care  management and care coordination needs related to COPD as evidenced by adherence to CM Team Scheduled appointments     through collaboration with Consulting civil engineer, provider, and care team.   Interventions: Inter-disciplinary care team collaboration (see longitudinal plan of care) Evaluation of current treatment plan related to  self management and patient's adherence to plan as established by provider Assisted with arranging transportation to upcoming appointments with patient- [facility] medical transportation,  pick up on 06/29/21 @ 9 am, return pick up time 11:30 am, conf # [id number]-reminded patient of transportation details Collaborated with PCP office for Lyrica refill-this medication was marked for discontinue during ED visit in May, per progress note he is to continue taking   SDOH Barriers (Status:  Condition stable.  Not addressed this visit. Patient interviewed and SDOH assessment performed        SDOH Interventions    Flowsheet Row Most Recent Value  SDOH Interventions   Housing Interventions Intervention Not Indicated      Patient interviewed and appropriate assessments performed Discussed plans with patient for ongoing care management follow up and provided patient with direct contact information for care management team-   COPD: (Status: Goal on Track (progressing): YES.) Long Term Goal  Reviewed medications with patient, including use of prescribed maintenance and rescue inhalers, and provided instruction on medication management and the importance of adherence Advised patient to use nebulizer as directed, three times daily Provided education about and advised patient to utilize infection prevention strategies to reduce risk of respiratory infection Discussed the importance of adequate rest and management of fatigue with COPD Assessed social determinant of health barriers Provided patient with COPD action zone education Advised patient to limit time outside with increased  pollen and rising temperatures   Patient Goals/Self-Care Activities: Take medications as prescribed   Attend all scheduled provider appointments Call pharmacy for medication refills 3-7 days in advance of running out of medications Perform all self care activities independently  Call provider office for new concerns or questions  - avoid second hand smoke - limit outdoor activity during cold weather - keep follow-up appointments: 06/10/21 for CT and 06/29/21 with [name] - eat healthy/prescribed diet: DASH - get at least 7 to 8 hours of sleep at night

## 2021-06-22 NOTE — Patient Instructions (Signed)
Visit Information  James [name] was given information about Medicaid Managed Care team care coordination services as a part of their Healthy [facility] Medicaid benefit. James [name] verbally consented to engagement with the [facility] Managed Care team.   If you are experiencing a medical emergency, please call 911 or report to your local emergency department or urgent care.   If you have a non-emergency medical problem during routine business hours, please contact your provider's office and ask to speak with a nurse.   For questions related to your Healthy [facility] health plan, please call: [phone] or visit the homepage here: [email or web address]  If you would like to schedule transportation through your Healthy [facility] plan, please call the following number at least 2 days in advance of your appointment: [phone]  For information about your ride after you set it up, call Ride Assist at [phone]. Use this number to activate a Will Call pickup, or if your transportation is late for a scheduled pickup. Use this number, too, if you need to make a change or cancel a previously scheduled reservation.  If you need transportation services right away, call [phone]. The after-hours call center is staffed 24 hours to handle ride assistance and urgent reservation requests (including discharges) 365 days a year. Urgent trips include sick visits, hospital discharge requests and life-sustaining treatment.  Call the [place] at [phone], at any time, 24 hours a day, 7 days a week. If you are in danger or need immediate medical attention call 911.  If you would like help to quit smoking, call 1-800-QUIT-NOW 5195614205) OR Espaol: 1-855-Djelo-Ya (3-016-010-9323) o para ms informacin haga clic aqu or Text READY to 200-400 to register via text  James [name],    The patient verbalized understanding of instructions,  educational materials, and care plan provided today and DECLINED offer to receive copy of patient instructions, educational materials, and care plan.   Telephone follow up appointment with Managed Medicaid care management team member scheduled for:07/09/21 @ 10:30am  [name] RN, BSN [place] RN Care Coordinator   Following is a copy of your plan of care:  Care Plan : RN Care Manager Plan of Care  Updates made by [name], RN since 06/22/2021 12:00 AM     Problem: Chronic Disease Management needs in patient with COPD   Priority: High     Long-Range Goal: Plan of care established for for Chronic Disease management for patient with COPD   Start Date: 11/13/2020  Expected End Date: 08/03/2021  Priority: High  Note:   Current Barriers:  Care Coordination needs related to Limited access to food  Chronic Disease Management support and education needs related to COPD James [name] is scheduled for PCP appointment on 06/29/21. He is requesting medication for pain in legs and feet. James [name] left message for [facility] stating that he can not get in touch with the pharmacy to request needed refills.  RNCM Clinical Goal(s):  Patient will verbalize understanding of plan for management of COPD as evidenced by taking all medications as prescribed, attending all scheduled appointments and contacting provider with questions and concerns take all medications exactly as prescribed and will call provider for medication related questions as evidenced by refilling medications and taking as prescribed    attend all scheduled medical appointments: 6/7 for CT and 6/26 with PCP follow up as evidenced by provider notes in EMR        demonstrate improved adherence to  prescribed treatment plan for COPD as evidenced by taking all medications as prescribed, attending all scheduled appointments and contacting provider with questions and concerns continue to work with RN Care Manager  and/or Social Worker to address care management and care coordination needs related to COPD as evidenced by adherence to CM Team Scheduled appointments     through collaboration with Consulting civil engineer, provider, and care team.   Interventions: Inter-disciplinary care team collaboration (see longitudinal plan of care) Evaluation of current treatment plan related to  self management and patient's adherence to plan as established by provider Assisted with arranging transportation to upcoming appointments with patient- [employer] medical transportation,  pick up on 06/29/21 @ 9 am, return pick up time 11:30 am, conf # [id number]-reminded patient of transportation details Collaborated with PCP office for Lyrica refill-this medication was marked for discontinue during ED visit in May, per progress note he is to continue taking RNCM returned call to patient, received list of needed medications, made call to pharmacy to request for needed refills   SDOH Barriers (Status:  Condition stable.  Not addressed this visit. Patient interviewed and SDOH assessment performed        SDOH Interventions    Flowsheet Row Most Recent Value  SDOH Interventions   Housing Interventions Intervention Not Indicated      Patient interviewed and appropriate assessments performed Discussed plans with patient for ongoing care management follow up and provided patient with direct contact information for care management team-   COPD: (Status: Goal on Track (progressing): YES.) Long Term Goal  Reviewed medications with patient, including use of prescribed maintenance and rescue inhalers, and provided instruction on medication management and the importance of adherence Advised patient to use nebulizer as directed, three times daily Provided education about and advised patient to utilize infection prevention strategies to reduce risk of respiratory infection Discussed the importance of adequate rest and management of fatigue with  COPD Assessed social determinant of health barriers Provided patient with COPD action zone education Advised patient to limit time outside with increased pollen and rising temperatures   Patient Goals/Self-Care Activities: Take medications as prescribed   Attend all scheduled provider appointments Call pharmacy for medication refills 3-7 days in advance of running out of medications Perform all self care activities independently  Call provider office for new concerns or questions  - avoid second hand smoke - limit outdoor activity during cold weather - keep follow-up appointments: 06/10/21 for CT and 06/29/21 with [name] - eat healthy/prescribed diet: DASH - get at least 7 to 8 hours of sleep at night

## 2021-06-23 ENCOUNTER — Telehealth: Payer: Self-pay | Admitting: Critical Care Medicine

## 2021-06-23 ENCOUNTER — Telehealth: Payer: Self-pay | Admitting: *Deleted

## 2021-06-23 ENCOUNTER — Other Ambulatory Visit: Payer: Self-pay

## 2021-06-23 MED ORDER — PREGABALIN 100 MG PO CAPS
100.0000 mg | ORAL_CAPSULE | Freq: Two times a day (BID) | ORAL | 2 refills | Status: DC
  Filled 2021-06-23 – 2021-06-29 (×2): qty 60, 30d supply, fill #0
  Filled 2021-08-06: qty 60, 30d supply, fill #1
  Filled 2021-09-09: qty 60, 30d supply, fill #2

## 2021-06-23 NOTE — Telephone Encounter (Signed)
-----   Message from [name], RN sent at 06/22/2021 11:40 AM EDT ----- Regarding: medication Good morning,  I am speaking with James [name] to remind him of his appointment and transportation on 06/29/21. He is requesting something for the pain in his legs and feet. It looks like lyrica was on his medication list and D/C during his last ED visit. He would like to continue taking lyrica and needs a refill. Can you order if appropriate? Thank you  [name] RN, BSN [place] RN Care Coordinator

## 2021-06-23 NOTE — Telephone Encounter (Signed)
Called pt and he is aware of medication

## 2021-06-23 NOTE — Telephone Encounter (Signed)
Let pt know I sent lyrica to our pharmacy for leg pain due to neuropathy from DM

## 2021-06-23 NOTE — Patient Outreach (Signed)
Care Coordination  06/23/2021  James [name] July 30, 1960 [id number]  RNCM calling to notify [name] that prescription for Lyrica has been sent to the pharmacy by [name]. He should have 5 medications to pick up today. He voiced understanding and thanks. He will pick up medications today. RNCM will follow up with patient at next scheduled telephone visit on 07/09/21.  [name] RN, BSN [place]  [employer] manager

## 2021-06-29 ENCOUNTER — Other Ambulatory Visit: Payer: Self-pay

## 2021-06-29 ENCOUNTER — Ambulatory Visit: Payer: Medicaid Other | Attending: Critical Care Medicine | Admitting: Critical Care Medicine

## 2021-06-29 ENCOUNTER — Encounter: Payer: Self-pay | Admitting: Critical Care Medicine

## 2021-06-29 VITALS — BP 142/80 | HR 81 | Wt 208.2 lb

## 2021-06-29 DIAGNOSIS — G629 Polyneuropathy, unspecified: Secondary | ICD-10-CM | POA: Diagnosis not present

## 2021-06-29 DIAGNOSIS — Z87898 Personal history of other specified conditions: Secondary | ICD-10-CM | POA: Diagnosis not present

## 2021-06-29 DIAGNOSIS — J449 Chronic obstructive pulmonary disease, unspecified: Secondary | ICD-10-CM | POA: Diagnosis not present

## 2021-06-29 DIAGNOSIS — J9611 Chronic respiratory failure with hypoxia: Secondary | ICD-10-CM

## 2021-06-29 DIAGNOSIS — L2089 Other atopic dermatitis: Secondary | ICD-10-CM | POA: Diagnosis not present

## 2021-06-29 DIAGNOSIS — R918 Other nonspecific abnormal finding of lung field: Secondary | ICD-10-CM

## 2021-06-29 DIAGNOSIS — E119 Type 2 diabetes mellitus without complications: Secondary | ICD-10-CM | POA: Diagnosis not present

## 2021-06-29 DIAGNOSIS — J189 Pneumonia, unspecified organism: Secondary | ICD-10-CM | POA: Diagnosis not present

## 2021-06-29 DIAGNOSIS — J4489 Other specified chronic obstructive pulmonary disease: Secondary | ICD-10-CM

## 2021-06-29 DIAGNOSIS — I10 Essential (primary) hypertension: Secondary | ICD-10-CM

## 2021-06-29 LAB — GLUCOSE, POCT (MANUAL RESULT ENTRY): POC Glucose: 227 mg/dl — AB (ref 70–99)

## 2021-06-29 MED ORDER — CLOBETASOL PROPIONATE 0.05 % EX CREA
1.0000 | TOPICAL_CREAM | Freq: Two times a day (BID) | CUTANEOUS | 0 refills | Status: DC
  Filled 2021-06-29: qty 30, 15d supply, fill #0

## 2021-06-29 MED ORDER — METOPROLOL SUCCINATE ER 25 MG PO TB24
25.0000 mg | ORAL_TABLET | Freq: Every day | ORAL | 3 refills | Status: DC
  Filled 2021-06-29 – 2021-09-09 (×2): qty 90, 90d supply, fill #0
  Filled 2021-09-09: qty 30, 30d supply, fill #0
  Filled 2021-09-09 (×2): qty 90, 90d supply, fill #0
  Filled 2021-10-14: qty 30, 30d supply, fill #1
  Filled 2021-11-24: qty 30, 30d supply, fill #2

## 2021-06-29 MED ORDER — PANTOPRAZOLE SODIUM 40 MG PO TBEC
40.0000 mg | DELAYED_RELEASE_TABLET | Freq: Every day | ORAL | 6 refills | Status: DC
  Filled 2021-06-29: qty 60, 60d supply, fill #0
  Filled 2021-09-09: qty 60, 60d supply, fill #1
  Filled 2021-09-09: qty 30, 30d supply, fill #1
  Filled 2021-10-14: qty 30, 30d supply, fill #2
  Filled 2021-11-24: qty 30, 30d supply, fill #3

## 2021-06-29 MED ORDER — TRELEGY ELLIPTA 100-62.5-25 MCG/ACT IN AEPB
1.0000 | INHALATION_SPRAY | Freq: Every day | RESPIRATORY_TRACT | 2 refills | Status: DC
  Filled 2021-06-29 – 2021-07-08 (×3): qty 60, 30d supply, fill #0
  Filled 2021-08-06: qty 60, 30d supply, fill #1

## 2021-06-29 MED ORDER — GLIPIZIDE ER 5 MG PO TB24
5.0000 mg | ORAL_TABLET | Freq: Every day | ORAL | 1 refills | Status: DC
  Filled 2021-06-29 – 2021-08-06 (×2): qty 60, 60d supply, fill #0
  Filled 2021-10-14: qty 60, 60d supply, fill #1

## 2021-06-29 MED ORDER — LOSARTAN POTASSIUM 50 MG PO TABS
50.0000 mg | ORAL_TABLET | Freq: Every day | ORAL | 1 refills | Status: DC
  Filled 2021-06-29: qty 90, 90d supply, fill #0
  Filled 2021-09-09 – 2021-10-14 (×2): qty 90, 90d supply, fill #1

## 2021-06-29 NOTE — Assessment & Plan Note (Signed)
This has been a recurrent problem we will renew clobetasol to both arms

## 2021-07-03 ENCOUNTER — Other Ambulatory Visit: Payer: Self-pay | Admitting: Pharmacist

## 2021-07-03 ENCOUNTER — Other Ambulatory Visit: Payer: Self-pay

## 2021-07-03 ENCOUNTER — Other Ambulatory Visit: Payer: Self-pay | Admitting: *Deleted

## 2021-07-03 MED ORDER — PREDNISONE 10 MG PO TABS
ORAL_TABLET | ORAL | 2 refills | Status: DC
  Filled 2021-07-03: qty 30, 30d supply, fill #0

## 2021-07-03 NOTE — Patient Outreach (Signed)
Care Coordination  07/03/2021  RANSON BELLUOMINI 1960/08/01 [id number]  RNCM returning call to Mr. Hott. He had left a voicemail for RNCM to contact him. He was concerned about his prednisone prescription that was picked up on 06/29/21 only including 3 50mg  tablets. RNCM left voicemail and sent email to [place] for clarification.  Communication with pharmacy, explained to [facility] that prednisone prescription from February 2023 was filled. Pharmacy had not received Prednisone 10mg  tablet order. Pharmacy staff will reach out to Clinical Pharmacist for clarification of prednisone prescription.  RNCM will notify Mr. Henshaw of situation.   [name] RN, BSN [place]  [employer] manager

## 2021-07-05 DIAGNOSIS — F79 Unspecified intellectual disabilities: Secondary | ICD-10-CM | POA: Diagnosis not present

## 2021-07-05 DIAGNOSIS — J9601 Acute respiratory failure with hypoxia: Secondary | ICD-10-CM | POA: Diagnosis not present

## 2021-07-05 DIAGNOSIS — J9621 Acute and chronic respiratory failure with hypoxia: Secondary | ICD-10-CM | POA: Diagnosis not present

## 2021-07-06 ENCOUNTER — Telehealth: Payer: Self-pay | Admitting: Critical Care Medicine

## 2021-07-06 ENCOUNTER — Other Ambulatory Visit: Payer: Self-pay

## 2021-07-06 DIAGNOSIS — F79 Unspecified intellectual disabilities: Secondary | ICD-10-CM | POA: Diagnosis not present

## 2021-07-06 DIAGNOSIS — J9621 Acute and chronic respiratory failure with hypoxia: Secondary | ICD-10-CM | POA: Diagnosis not present

## 2021-07-06 DIAGNOSIS — J9601 Acute respiratory failure with hypoxia: Secondary | ICD-10-CM | POA: Diagnosis not present

## 2021-07-06 MED ORDER — PREDNISONE 10 MG PO TABS
ORAL_TABLET | ORAL | 2 refills | Status: DC
  Filled 2021-07-08: qty 34, 34d supply, fill #0

## 2021-07-06 NOTE — Telephone Encounter (Signed)
-----   Message from [name], RN sent at 07/03/2021  3:06 PM EDT ----- Regarding: medication Hello,  Mr. Luckett contacted me today. He picked up prednisone on 06/29/21. He was given 3 50mg  tablets(script from February 2023). He took those and reports his breathing is good, but now he is out of prednisone. The pharmacy reports not receiving prescription for prednisone 10mg . Can you resend this prescription? Thank you  [name] RN, BSN [place] RN Care Coordinator

## 2021-07-06 NOTE — Telephone Encounter (Signed)
Let him know his basal chronic prednisone Rx was sent to our pharmacy  thanks [facility]

## 2021-07-08 ENCOUNTER — Other Ambulatory Visit (HOSPITAL_COMMUNITY): Payer: Self-pay

## 2021-07-08 ENCOUNTER — Other Ambulatory Visit: Payer: Self-pay | Admitting: *Deleted

## 2021-07-08 ENCOUNTER — Other Ambulatory Visit: Payer: Self-pay

## 2021-07-08 NOTE — Patient Instructions (Signed)
Visit Information  James [name] was given information about Medicaid Managed Care team care coordination services as a part of their Healthy [facility] Medicaid benefit. James [name] verbally consented to engagement with the [facility] Managed Care team.   If you are experiencing a medical emergency, please call 911 or report to your local emergency department or urgent care.   If you have a non-emergency medical problem during routine business hours, please contact your provider's office and ask to speak with a nurse.   For questions related to your Healthy [facility] health plan, please call: [phone] or visit the homepage here: [email or web address]  If you would like to schedule transportation through your Healthy [facility] plan, please call the following number at least 2 days in advance of your appointment: [phone]  For information about your ride after you set it up, call Ride Assist at [phone]. Use this number to activate a Will Call pickup, or if your transportation is late for a scheduled pickup. Use this number, too, if you need to make a change or cancel a previously scheduled reservation.  If you need transportation services right away, call [phone]. The after-hours call center is staffed 24 hours to handle ride assistance and urgent reservation requests (including discharges) 365 days a year. Urgent trips include sick visits, hospital discharge requests and life-sustaining treatment.  Call the [place] at [phone], at any time, 24 hours a day, 7 days a week. If you are in danger or need immediate medical attention call 911.  If you would like help to quit smoking, call 1-800-QUIT-NOW 806-433-3127) OR Espaol: 1-855-Djelo-Ya (5-638-756-4332) o para ms informacin haga clic aqu or Text READY to 200-400 to register via text  James [name],   Please see education materials related to COPD provided as print  materials.   The patient verbalized understanding of instructions, educational materials, and care plan provided today and agreed to receive a mailed copy of patient instructions, educational materials, and care plan.   The Managed Medicaid care management team will reach out to the patient again over the next 30 days.   [name] RN, BSN [place]  [employer] manager   Following is a copy of your plan of care:  Care Plan : RN Care Manager Plan of Care  Updates made by [name], RN since 07/08/2021 12:00 AM     Problem: Chronic Disease Management needs in patient with COPD   Priority: High     Long-Range Goal: Plan of care established for for Chronic Disease management for patient with COPD   Start Date: 11/13/2020  Expected End Date: 09/03/2021  Priority: High  Note:   Current Barriers:  Care Coordination needs related to Limited access to food  Chronic Disease Management support and education needs related to COPD James [name] is needing medication refills and having difficulty reaching the pharmacy. He had difficulty breathing on Monday, called EMS for assistance. Breathing improved after increasing oxygen. He has not picked up prednisone and does not have transportation.   RNCM Clinical Goal(s):  Patient will verbalize understanding of plan for management of COPD as evidenced by taking all medications as prescribed, attending all scheduled appointments and contacting provider with questions and concerns take all medications exactly as prescribed and will call provider for medication related questions as evidenced by refilling medications and taking as prescribed    attend all scheduled medical appointments: 7/18 with PCP and 7/25 for Pulmonary consult as evidenced by  provider notes in EMR        demonstrate improved adherence to prescribed treatment plan for COPD as evidenced by taking all medications as prescribed, attending all scheduled  appointments and contacting provider with questions and concerns continue to work with RN Care Manager and/or Social Worker to address care management and care coordination needs related to COPD as evidenced by adherence to CM Team Scheduled appointments     through collaboration with RN Care manager, provider, and care team.   Interventions: Inter-disciplinary care team collaboration (see longitudinal plan of care) Evaluation of current treatment plan related to  self management and patient's adherence to plan as established by provider Assisted with arranging transportation to upcoming appointments with patient- [employer] medical transportation,  pick up on 07/21/21 @ 1:30pm, going to Dr. Bettina Gavia office and return pick up at [place] up on 07/28/21 @ 8:25am, going to Dr. Juline Patch office and return pick up at [place] with [place] for needed medications(trelegy, furosemide and Prednisone), requested delivery to patient   SDOH Barriers (Status:  Goal on track:  Yes. Patient interviewed and SDOH assessment performed        SDOH Interventions    Flowsheet Row Most Recent Value  SDOH Interventions   Transportation Interventions Other (Comment)  [Assisted with arranging transportation with [employer] medical transporation and having medications delivered]       Patient interviewed and appropriate assessments performed Discussed plans with patient for ongoing care management follow up and provided patient with direct contact information for care management team-   COPD: (Status: Goal on Track (progressing): YES.) Long Term Goal  Reviewed medications with patient, including use of prescribed maintenance and rescue inhalers, and provided instruction on medication management and the importance of adherence Advised patient to use nebulizer as directed, three times daily Provided instruction about proper use of medications used for management of COPD including  inhalers Advised patient to self assesses COPD action plan zone and make appointment with provider if in the yellow zone for 48 hours without improvement Discussed the importance of adequate rest and management of fatigue with COPD Assessed social determinant of health barriers Provided patient with COPD action zone education Advised patient to limit time outside with increased pollen and rising temperatures   Patient Goals/Self-Care Activities: Take medications as prescribed   Attend all scheduled provider appointments Call pharmacy for medication refills 3-7 days in advance of running out of medications Perform all self care activities independently  Call provider office for new concerns or questions  - avoid second hand smoke - limit outdoor activity during cold weather - keep follow-up appointments: 06/10/21 for CT and 06/29/21 with [name] - eat healthy/prescribed diet: DASH - get at least 7 to 8 hours of sleep at night

## 2021-07-08 NOTE — Patient Outreach (Signed)
Medicaid Managed Care   Nurse Care Manager Note  07/08/2021 Name:  James [name] MRN:  [id number] DOB:  March 01, 1960  James [name] is an 61 y.o. year old male who is a primary patient of [name], MD.  The [facility] Managed Care Coordination team was consulted for assistance with:    COPD SDOH  [name] was given information about Medicaid Managed Care Coordination team services today. James [name] Patient agreed to services and verbal consent obtained.  Engaged with patient by telephone for follow up visit in response to provider referral for case management and/or care coordination services.   Assessments/Interventions:  Review of past medical history, allergies, medications, health status, including review of consultants reports, laboratory and other test data, was performed as part of comprehensive evaluation and provision of chronic care management services.  SDOH (Social Determinants of Health) assessments and interventions performed: SDOH Interventions    Flowsheet Row Most Recent Value  SDOH Interventions   Transportation Interventions Other (Comment)  [Assisted with arranging transportation with [employer] medical transporation and having medications delivered]       Care Plan  No Known Allergies  Medications Reviewed Today     Reviewed by [name], RN (Registered Nurse) on 07/08/21 at [phone]  Med List Status: <None>   Medication Order Taking? Sig Documenting Provider Last Dose Status Informant  albuterol (PROVENTIL) (2.5 MG/3ML) 0.083% nebulizer solution [id number] Yes Take 3 mLs (2.5 mg total) by nebulization every 6 (six) hours as needed for wheezing or shortness of breath. James Stain, MD Taking Active   albuterol (VENTOLIN HFA) 108 [phone] Base) MCG/ACT inhaler [id number] Yes Inhale 2 puffs into the lungs every 4 (four) hours as needed. James Stain, MD Taking Active Self  clobetasol cream (TEMOVATE) 0.05 % [id number] Yes Apply 1 Application  topically 2 (two) times daily. James Stain, MD Taking Active   Fluticasone-Umeclidin-Vilant [facility] ELLIPTA) 100-62.5-25 MCG/ACT AEPB [id number] Yes Inhale 1 puff into the lungs daily. James Stain, MD Taking Active   furosemide (LASIX) 20 MG tablet [id number] Yes Take 1 tablet (20 mg total) by mouth daily as needed for edema. James Stain, MD Taking Active Self  glipiZIDE (GLUCOTROL XL) 5 MG 24 hr tablet [id number] Yes Take 1 tablet (5 mg total) by mouth daily with breakfast. James Stain, MD Taking Active   guaiFENesin (ROBITUSSIN) 100 MG/5ML liquid [id number] No Take 5 mLs by mouth every 4 (four) hours as needed for cough or to loosen phlegm.  Patient not taking: Reported on 06/05/2021   James Axon, MD Not Taking Active            Med Note ([name] A   Mon May 11, 2021  1:10 PM) Needs to pick up   losartan (COZAAR) 50 MG tablet [id number] Yes Take 1 tablet (50 mg total) by mouth daily. James Stain, MD Taking Active   metoprolol succinate (TOPROL-XL) 25 MG 24 hr tablet [id number] Yes Take 1 tablet (25 mg total) by mouth daily. James Stain, MD Taking Active   pantoprazole (PROTONIX) 40 MG tablet [id number] Yes Take 1 tablet (40 mg total) by mouth daily. James Stain, MD Taking Active   predniSONE (DELTASONE) 10 MG tablet [id number] No Take 1 tablet daily.  Patient not taking: Reported on 07/08/2021   James Stain, MD Not Taking Active            Med Note (James [name] A   Wed  Jul 08, 2021  9:49 AM) Requested pharmacy to deliver medication  pregabalin (LYRICA) 100 MG capsule [id number] Yes Take 1 capsule (100 mg total) by mouth 2 (two) times daily. James Stain, MD Taking Active   Respiratory Therapy Supplies (FLUTTER) DEVI [id number] Yes Use 4 times daily James Stain, MD Taking Active Self            Patient Active Problem List   Diagnosis Date Noted   Multiple lung nodules on CT 06/11/2021   COPD with chronic bronchitis ([place])  02/01/2021   GERD (gastroesophageal reflux disease) 02/01/2021   Neuropathy 09/29/2020   Bronchiectasis ([place]) 09/24/2019   Type 2 diabetes mellitus without complication, without long-term current use of insulin ([place]) 07/24/2019   Other atopic dermatitis 05/01/2019   Chronic respiratory failure with hypoxia ([place]) 04/23/2019   Intellectual disability 04/23/2019   History of alcohol use 11/23/2018   Hypertension 05/17/2016   History of tobacco abuse 04/06/2016   COPD mixed type ([place]) 03/26/2016    Conditions to be addressed/monitored per PCP order:  COPD and SDOH  Care Plan : RN Care Manager Plan of Care  Updates made by [name], RN since 07/08/2021 12:00 AM     Problem: Chronic Disease Management needs in patient with COPD   Priority: High     Long-Range Goal: Plan of care established for for Chronic Disease management for patient with COPD   Start Date: 11/13/2020  Expected End Date: 09/03/2021  Priority: High  Note:   Current Barriers:  Care Coordination needs related to Limited access to food  Chronic Disease Management support and education needs related to COPD [name] is needing medication refills and having difficulty reaching the pharmacy. He had difficulty breathing on Monday, called EMS for assistance. Breathing improved after increasing oxygen. He has not picked up prednisone and does not have transportation.   RNCM Clinical Goal(s):  Patient will verbalize understanding of plan for management of COPD as evidenced by taking all medications as prescribed, attending all scheduled appointments and contacting provider with questions and concerns take all medications exactly as prescribed and will call provider for medication related questions as evidenced by refilling medications and taking as prescribed    attend all scheduled medical appointments: 7/18 with PCP and 7/25 for Pulmonary consult as evidenced by provider notes in EMR        demonstrate improved  adherence to prescribed treatment plan for COPD as evidenced by taking all medications as prescribed, attending all scheduled appointments and contacting provider with questions and concerns continue to work with RN Care Manager and/or Social Worker to address care management and care coordination needs related to COPD as evidenced by adherence to CM Team Scheduled appointments     through collaboration with Consulting civil engineer, provider, and care team.   Interventions: Inter-disciplinary care team collaboration (see longitudinal plan of care) Evaluation of current treatment plan related to  self management and patient's adherence to plan as established by provider Assisted with arranging transportation to upcoming appointments with patient- [facility] medical transportation,  pick up on 07/21/21 @ 1:30pm, going to Dr. Bettina Gavia office and return pick up at [place] up on 07/28/21 @ 8:25am, going to Dr. Juline Patch office and return pick up at [place] with [facility] for needed medications(trelegy, furosemide and Prednisone), requested delivery to patient   SDOH Barriers (Status:  Goal on track:  Yes. Patient interviewed and SDOH assessment performed  SDOH Interventions    Flowsheet Row Most Recent Value  SDOH Interventions   Transportation Interventions Other (Comment)  [Assisted with arranging transportation with [employer] [facility] transpor[facility] and having medications delivered]       Patient interviewed and appropriate assessments performed Discussed plans with patient for ongoing care management follow up and provided patient with direct contact information for care management team-   COPD: (Status: Goal on Track (progressing): YES.) Long Term Goal  Reviewed medications with patient, including use of prescribed maintenance and rescue inhalers, and provided instruction on medication management and the importance of adherence Advised patient to use nebulizer as  directed, three times daily Provided instruction about proper use of medications used for management of COPD including inhalers Advised patient to self assesses COPD action plan zone and make appointment with provider if in the yellow zone for 48 hours without improvement Discussed the importance of adequate rest and management of fatigue with COPD Assessed social determinant of health barriers Provided patient with COPD action zone education Advised patient to limit time outside with increased pollen and rising temperatures   Patient Goals/Self-Care Activities: Take medications as prescribed   Attend all scheduled provider appointments Call pharmacy for medication refills 3-7 days in advance of running out of medications Perform all self care activities independently  Call provider office for new concerns or questions  - avoid second hand smoke - limit outdoor activity during cold weather - keep follow-up appointments: 06/10/21 for CT and 06/29/21 with [name] - eat healthy/prescribed diet: DASH - get at least 7 to 8 hours of sleep at night       Follow Up:  Patient agrees to Care Plan and Follow-up.  Plan: The Managed Medicaid care management team will reach out to the patient again over the next 30 days.  Date/time of next scheduled RN care management/care coordination outreach:  08/06/21 @ 10:30am  [name] RN, BSN [place] RN Care Coordinator

## 2021-07-09 ENCOUNTER — Ambulatory Visit: Payer: Medicaid Other

## 2021-07-11 ENCOUNTER — Other Ambulatory Visit (HOSPITAL_COMMUNITY): Payer: Self-pay

## 2021-07-12 ENCOUNTER — Emergency Department (HOSPITAL_COMMUNITY): Payer: Medicaid Other

## 2021-07-12 ENCOUNTER — Inpatient Hospital Stay (HOSPITAL_COMMUNITY)
Admission: EM | Admit: 2021-07-12 | Discharge: 2021-07-14 | DRG: 193 | Disposition: A | Discharge status: Home Health | Payer: Medicaid Other | Attending: Internal Medicine | Admitting: Internal Medicine

## 2021-07-12 DIAGNOSIS — R0902 Hypoxemia: Secondary | ICD-10-CM | POA: Diagnosis not present

## 2021-07-12 DIAGNOSIS — Z9981 Dependence on supplemental oxygen: Secondary | ICD-10-CM | POA: Diagnosis not present

## 2021-07-12 DIAGNOSIS — F1010 Alcohol abuse, uncomplicated: Secondary | ICD-10-CM | POA: Diagnosis present

## 2021-07-12 DIAGNOSIS — J9620 Acute and chronic respiratory failure, unspecified whether with hypoxia or hypercapnia: Secondary | ICD-10-CM | POA: Diagnosis not present

## 2021-07-12 DIAGNOSIS — Z79899 Other long term (current) drug therapy: Secondary | ICD-10-CM | POA: Diagnosis not present

## 2021-07-12 DIAGNOSIS — Z7951 Long term (current) use of inhaled steroids: Secondary | ICD-10-CM

## 2021-07-12 DIAGNOSIS — J441 Chronic obstructive pulmonary disease with (acute) exacerbation: Secondary | ICD-10-CM | POA: Diagnosis present

## 2021-07-12 DIAGNOSIS — R0600 Dyspnea, unspecified: Principal | ICD-10-CM

## 2021-07-12 DIAGNOSIS — J9621 Acute and chronic respiratory failure with hypoxia: Secondary | ICD-10-CM | POA: Diagnosis not present

## 2021-07-12 DIAGNOSIS — J440 Chronic obstructive pulmonary disease with acute lower respiratory infection: Secondary | ICD-10-CM | POA: Diagnosis present

## 2021-07-12 DIAGNOSIS — Z825 Family history of asthma and other chronic lower respiratory diseases: Secondary | ICD-10-CM

## 2021-07-12 DIAGNOSIS — E119 Type 2 diabetes mellitus without complications: Secondary | ICD-10-CM | POA: Diagnosis not present

## 2021-07-12 DIAGNOSIS — R072 Precordial pain: Secondary | ICD-10-CM | POA: Diagnosis present

## 2021-07-12 DIAGNOSIS — Z7984 Long term (current) use of oral hypoglycemic drugs: Secondary | ICD-10-CM | POA: Diagnosis not present

## 2021-07-12 DIAGNOSIS — I10 Essential (primary) hypertension: Secondary | ICD-10-CM | POA: Diagnosis present

## 2021-07-12 DIAGNOSIS — R0602 Shortness of breath: Secondary | ICD-10-CM | POA: Diagnosis not present

## 2021-07-12 DIAGNOSIS — Z7952 Long term (current) use of systemic steroids: Secondary | ICD-10-CM

## 2021-07-12 DIAGNOSIS — Z20822 Contact with and (suspected) exposure to covid-19: Secondary | ICD-10-CM | POA: Diagnosis present

## 2021-07-12 DIAGNOSIS — J9611 Chronic respiratory failure with hypoxia: Secondary | ICD-10-CM | POA: Diagnosis present

## 2021-07-12 DIAGNOSIS — Z87891 Personal history of nicotine dependence: Secondary | ICD-10-CM

## 2021-07-12 DIAGNOSIS — J189 Pneumonia, unspecified organism: Secondary | ICD-10-CM

## 2021-07-12 DIAGNOSIS — R000 Tachycardia, unspecified: Secondary | ICD-10-CM | POA: Diagnosis not present

## 2021-07-12 DIAGNOSIS — J80 Acute respiratory distress syndrome: Secondary | ICD-10-CM | POA: Diagnosis not present

## 2021-07-12 DIAGNOSIS — R079 Chest pain, unspecified: Secondary | ICD-10-CM | POA: Diagnosis not present

## 2021-07-12 DIAGNOSIS — R0789 Other chest pain: Secondary | ICD-10-CM | POA: Diagnosis not present

## 2021-07-12 HISTORY — DX: Pneumonia, unspecified organism: J18.9

## 2021-07-12 LAB — I-STAT CHEM 8, ED
BUN: 15 mg/dL (ref 6–20)
Calcium, Ion: 0.97 mmol/L — ABNORMAL LOW (ref 1.15–1.40)
Chloride: 107 mmol/L (ref 98–111)
Creatinine, Ser: 0.8 mg/dL (ref 0.61–1.24)
Glucose, Bld: 127 mg/dL — ABNORMAL HIGH (ref 70–99)
HCT: 44 % (ref 39.0–52.0)
Hemoglobin: 15 g/dL (ref 13.0–17.0)
Potassium: 4 mmol/L (ref 3.5–5.1)
Sodium: 141 mmol/L (ref 135–145)
TCO2: 27 mmol/L (ref 22–32)

## 2021-07-12 LAB — I-STAT VENOUS BLOOD GAS, ED
Acid-Base Excess: 5 mmol/L — ABNORMAL HIGH (ref 0.0–2.0)
Bicarbonate: 29.2 mmol/L — ABNORMAL HIGH (ref 20.0–28.0)
Calcium, Ion: 0.95 mmol/L — ABNORMAL LOW (ref 1.15–1.40)
HCT: 44 % (ref 39.0–52.0)
Hemoglobin: 15 g/dL (ref 13.0–17.0)
O2 Saturation: 100 %
Potassium: 3.9 mmol/L (ref 3.5–5.1)
Sodium: 141 mmol/L (ref 135–145)
TCO2: 30 mmol/L (ref 22–32)
pCO2, Ven: 41.7 mmHg — ABNORMAL LOW (ref 44–60)
pH, Ven: 7.453 — ABNORMAL HIGH (ref 7.25–7.43)
pO2, Ven: 211 mmHg — ABNORMAL HIGH (ref 32–45)

## 2021-07-12 LAB — URINALYSIS, ROUTINE W REFLEX MICROSCOPIC
Bilirubin Urine: NEGATIVE
Glucose, UA: 150 mg/dL — AB
Hgb urine dipstick: NEGATIVE
Ketones, ur: NEGATIVE mg/dL
Leukocytes,Ua: NEGATIVE
Nitrite: NEGATIVE
Protein, ur: NEGATIVE mg/dL
Specific Gravity, Urine: 1.026 (ref 1.005–1.030)
pH: 5 (ref 5.0–8.0)

## 2021-07-12 LAB — CBC WITH DIFFERENTIAL/PLATELET
Abs Immature Granulocytes: 0.11 10*3/uL — ABNORMAL HIGH (ref 0.00–0.07)
Basophils Absolute: 0.1 10*3/uL (ref 0.0–0.1)
Basophils Relative: 0 %
Eosinophils Absolute: 0.2 10*3/uL (ref 0.0–0.5)
Eosinophils Relative: 1 %
HCT: 45.3 % (ref 39.0–52.0)
Hemoglobin: 14.4 g/dL (ref 13.0–17.0)
Immature Granulocytes: 1 %
Lymphocytes Relative: 17 %
Lymphs Abs: 3.8 10*3/uL (ref 0.7–4.0)
MCH: 30.1 pg (ref 26.0–34.0)
MCHC: 31.8 g/dL (ref 30.0–36.0)
MCV: 94.6 fL (ref 80.0–100.0)
Monocytes Absolute: 1.2 10*3/uL — ABNORMAL HIGH (ref 0.1–1.0)
Monocytes Relative: 5 %
Neutro Abs: 17.1 10*3/uL — ABNORMAL HIGH (ref 1.7–7.7)
Neutrophils Relative %: 76 %
Platelets: 306 10*3/uL (ref 150–400)
RBC: 4.79 MIL/uL (ref 4.22–5.81)
RDW: 13.5 % (ref 11.5–15.5)
WBC: 22.5 10*3/uL — ABNORMAL HIGH (ref 4.0–10.5)
nRBC: 0 % (ref 0.0–0.2)

## 2021-07-12 LAB — RESP PANEL BY RT-PCR (FLU A&B, COVID) ARPGX2
Influenza A by PCR: NEGATIVE
Influenza B by PCR: NEGATIVE
SARS Coronavirus 2 by RT PCR: NEGATIVE

## 2021-07-12 LAB — TROPONIN I (HIGH SENSITIVITY)
Troponin I (High Sensitivity): 16 ng/L (ref <18)
Troponin I (High Sensitivity): 14 ng/L (ref <18)

## 2021-07-12 LAB — RAPID URINE DRUG SCREEN, HOSP PERFORMED
Amphetamines: NOT DETECTED
Barbiturates: NOT DETECTED
Benzodiazepines: NOT DETECTED
Cocaine: NOT DETECTED
Opiates: NOT DETECTED
Tetrahydrocannabinol: NOT DETECTED

## 2021-07-12 LAB — COMPREHENSIVE METABOLIC PANEL
ALT: 22 U/L (ref 0–44)
AST: 25 U/L (ref 15–41)
Albumin: 3.9 g/dL (ref 3.5–5.0)
Alkaline Phosphatase: 54 U/L (ref 38–126)
Anion gap: 11 (ref 5–15)
BUN: 12 mg/dL (ref 6–20)
CO2: 28 mmol/L (ref 22–32)
Calcium: 9.4 mg/dL (ref 8.9–10.3)
Chloride: 105 mmol/L (ref 98–111)
Creatinine, Ser: 0.84 mg/dL (ref 0.61–1.24)
GFR, Estimated: >60 mL/min (ref >60)
Glucose, Bld: 123 mg/dL — ABNORMAL HIGH (ref 70–99)
Potassium: 4 mmol/L (ref 3.5–5.1)
Sodium: 144 mmol/L (ref 135–145)
Total Bilirubin: 0.3 mg/dL (ref 0.3–1.2)
Total Protein: 6.6 g/dL (ref 6.5–8.1)

## 2021-07-12 LAB — CBG MONITORING, ED
Glucose-Capillary: 236 mg/dL — ABNORMAL HIGH (ref 70–99)
Glucose-Capillary: 285 mg/dL — ABNORMAL HIGH (ref 70–99)

## 2021-07-12 LAB — LACTIC ACID, PLASMA
Lactic Acid, Venous: 2.2 mmol/L (ref 0.5–1.9)
Lactic Acid, Venous: 1.8 mmol/L (ref 0.5–1.9)

## 2021-07-12 LAB — GLUCOSE, CAPILLARY: Glucose-Capillary: 132 mg/dL — ABNORMAL HIGH (ref 70–99)

## 2021-07-12 LAB — ETHANOL: Alcohol, Ethyl (B): <10 mg/dL (ref <10)

## 2021-07-12 LAB — BRAIN NATRIURETIC PEPTIDE: B Natriuretic Peptide: 67.6 pg/mL (ref 0.0–100.0)

## 2021-07-12 MED ORDER — PREDNISONE 20 MG PO TABS
40.0000 mg | ORAL_TABLET | Freq: Every day | ORAL | Status: DC
  Administered 2021-07-13 – 2021-07-14 (×2): 40 mg via ORAL
  Filled 2021-07-12 (×2): qty 2

## 2021-07-12 MED ORDER — AZITHROMYCIN 500 MG PO TABS
500.0000 mg | ORAL_TABLET | Freq: Every day | ORAL | Status: DC
  Administered 2021-07-12 – 2021-07-14 (×3): 500 mg via ORAL
  Filled 2021-07-12: qty 2
  Filled 2021-07-12 (×3): qty 1

## 2021-07-12 MED ORDER — ARFORMOTEROL TARTRATE 15 MCG/2ML IN NEBU
15.0000 ug | INHALATION_SOLUTION | Freq: Two times a day (BID) | RESPIRATORY_TRACT | Status: DC
  Administered 2021-07-12 – 2021-07-14 (×4): 15 ug via RESPIRATORY_TRACT
  Filled 2021-07-12 (×5): qty 2

## 2021-07-12 MED ORDER — SODIUM CHLORIDE 0.9 % IV SOLN
2.0000 g | Freq: Three times a day (TID) | INTRAVENOUS | Status: DC
  Administered 2021-07-12: 2 g via INTRAVENOUS
  Filled 2021-07-12: qty 12.5

## 2021-07-12 MED ORDER — ALBUTEROL SULFATE (2.5 MG/3ML) 0.083% IN NEBU
2.5000 mg | INHALATION_SOLUTION | Freq: Four times a day (QID) | RESPIRATORY_TRACT | Status: DC | PRN
  Filled 2021-07-12: qty 3

## 2021-07-12 MED ORDER — ADULT MULTIVITAMIN W/MINERALS CH
1.0000 | ORAL_TABLET | Freq: Every day | ORAL | Status: DC
  Administered 2021-07-13 – 2021-07-14 (×2): 1 via ORAL
  Filled 2021-07-12 (×2): qty 1

## 2021-07-12 MED ORDER — REVEFENACIN 175 MCG/3ML IN SOLN
175.0000 ug | Freq: Every day | RESPIRATORY_TRACT | Status: DC
  Administered 2021-07-13 – 2021-07-14 (×2): 175 ug via RESPIRATORY_TRACT
  Filled 2021-07-12 (×5): qty 3

## 2021-07-12 MED ORDER — INSULIN ASPART 100 UNIT/ML IJ SOLN
0.0000 [IU] | Freq: Three times a day (TID) | INTRAMUSCULAR | Status: DC
  Administered 2021-07-12: 8 [IU] via SUBCUTANEOUS
  Administered 2021-07-12: 5 [IU] via SUBCUTANEOUS
  Administered 2021-07-13 – 2021-07-14 (×3): 3 [IU] via SUBCUTANEOUS

## 2021-07-12 MED ORDER — LORAZEPAM 2 MG/ML IJ SOLN
0.5000 mg | Freq: Once | INTRAMUSCULAR | Status: AC
  Administered 2021-07-12: 0.5 mg via INTRAVENOUS
  Filled 2021-07-12: qty 1

## 2021-07-12 MED ORDER — RIVAROXABAN 10 MG PO TABS
10.0000 mg | ORAL_TABLET | Freq: Every day | ORAL | Status: DC
  Administered 2021-07-12 – 2021-07-14 (×3): 10 mg via ORAL
  Filled 2021-07-12 (×3): qty 1

## 2021-07-12 MED ORDER — IPRATROPIUM-ALBUTEROL 0.5-2.5 (3) MG/3ML IN SOLN
3.0000 mL | Freq: Once | RESPIRATORY_TRACT | Status: AC
  Administered 2021-07-12: 3 mL via RESPIRATORY_TRACT
  Filled 2021-07-12: qty 3

## 2021-07-12 MED ORDER — THIAMINE HCL 100 MG PO TABS
100.0000 mg | ORAL_TABLET | Freq: Every day | ORAL | Status: DC
  Administered 2021-07-12 – 2021-07-14 (×3): 100 mg via ORAL
  Filled 2021-07-12 (×3): qty 1

## 2021-07-12 MED ORDER — CEFEPIME HCL 2 G IV SOLR
2.0000 g | Freq: Three times a day (TID) | INTRAVENOUS | Status: DC
  Administered 2021-07-12 – 2021-07-14 (×5): 2 g via INTRAVENOUS
  Filled 2021-07-12 (×5): qty 12.5

## 2021-07-12 MED ORDER — SODIUM CHLORIDE 0.9 % IV SOLN: 2.0000 g | INTRAVENOUS | Status: DC

## 2021-07-12 MED ORDER — FOLIC ACID 1 MG PO TABS
1.0000 mg | ORAL_TABLET | Freq: Every day | ORAL | Status: DC
  Administered 2021-07-12 – 2021-07-14 (×3): 1 mg via ORAL
  Filled 2021-07-12 (×2): qty 1

## 2021-07-12 MED ORDER — VANCOMYCIN HCL 1750 MG/350ML IV SOLN
1750.0000 mg | Freq: Two times a day (BID) | INTRAVENOUS | Status: DC
  Administered 2021-07-12 – 2021-07-13 (×3): 1750 mg via INTRAVENOUS
  Filled 2021-07-12 (×5): qty 350

## 2021-07-12 MED ORDER — SODIUM CHLORIDE 0.9 % IV BOLUS
500.0000 mL | Freq: Once | INTRAVENOUS | Status: AC
Start: 2021-07-12 — End: 2021-07-12
  Administered 2021-07-12: 500 mL via INTRAVENOUS

## 2021-07-12 MED ORDER — IPRATROPIUM-ALBUTEROL 0.5-2.5 (3) MG/3ML IN SOLN
3.0000 mL | RESPIRATORY_TRACT | Status: DC
  Administered 2021-07-12: 3 mL via RESPIRATORY_TRACT
  Filled 2021-07-12: qty 3

## 2021-07-12 MED ORDER — BUDESONIDE 0.25 MG/2ML IN SUSP
0.2500 mg | Freq: Two times a day (BID) | RESPIRATORY_TRACT | Status: DC
  Administered 2021-07-12 – 2021-07-14 (×4): 0.25 mg via RESPIRATORY_TRACT
  Filled 2021-07-12 (×5): qty 2

## 2021-07-12 MED ORDER — IPRATROPIUM-ALBUTEROL 0.5-2.5 (3) MG/3ML IN SOLN
3.0000 mL | Freq: Two times a day (BID) | RESPIRATORY_TRACT | Status: DC
  Administered 2021-07-12 – 2021-07-14 (×4): 3 mL via RESPIRATORY_TRACT
  Filled 2021-07-12 (×4): qty 3

## 2021-07-12 NOTE — ED Provider Notes (Signed)
[facility] EMERGENCY DEPARTMENT Provider Note   CSN: [id number] Arrival date & time: 07/12/21  3220     History  Chief Complaint  Patient presents with   Shortness of Breath    James [name] is a 61 y.o. male.  61 year old male with prior medical history as detailed below presents for evaluation.  Patient arrives by EMS from home.  Patient reports increased shortness of breath and wheezing since early this morning.  EMS reports significant respiratory distress on their initial evaluation.  During EMS transport the patient has received 2 g of magnesium, 125 mg of Solu-Medrol, 2 DuoNeb treatments.  Patient with continued wheezing and tachypnea.  Patient reports longstanding history of COPD.  Patient reports prior intubations for COPD exacerbation.  Patient denies pain.  He denies recent fever or illness.  Patient reports his last EtOH intake was last night.  The history is provided by the patient and medical records.  Shortness of Breath Severity:  Severe Onset quality:  Gradual Duration:  3 hours Timing:  Constant Progression:  Unchanged Chronicity:  New Relieved by:  Nothing Worsened by:  Nothing      Home Medications Prior to Admission medications   Medication Sig Start Date End Date Taking? Authorizing Provider  albuterol (PROVENTIL) (2.5 MG/3ML) 0.083% nebulizer solution Take 3 mLs (2.5 mg total) by nebulization every 6 (six) hours as needed for wheezing or shortness of breath. 05/12/21 05/12/22  [name], MD  albuterol (VENTOLIN HFA) 108 (90 Base) MCG/ACT inhaler Inhale 2 puffs into the lungs every 4 (four) hours as needed. 12/22/20   [name], MD  clobetasol cream (TEMOVATE) 2.54 % Apply 1 Application topically 2 (two) times daily. 06/29/21   [name], MD  Fluticasone-Umeclidin-Vilant (TRELEGY ELLIPTA) 100-62.5-25 MCG/ACT AEPB Inhale 1 puff into the lungs daily. 06/29/21   [name], MD  furosemide (LASIX) 20 MG  tablet Take 1 tablet (20 mg total) by mouth daily as needed for edema. 04/29/21 04/29/22  [name], MD  glipiZIDE (GLUCOTROL XL) 5 MG 24 hr tablet Take 1 tablet (5 mg total) by mouth daily with breakfast. 06/29/21   [name], MD  guaiFENesin (ROBITUSSIN) 100 MG/5ML liquid Take 5 mLs by mouth every 4 (four) hours as needed for cough or to loosen phlegm. Patient not taking: Reported on 06/05/2021 05/10/21   [name], MD  losartan (COZAAR) 50 MG tablet Take 1 tablet (50 mg total) by mouth daily. 06/29/21   [name], MD  metoprolol succinate (TOPROL-XL) 25 MG 24 hr tablet Take 1 tablet (25 mg total) by mouth daily. 06/29/21 06/29/22  [name], MD  pantoprazole (PROTONIX) 40 MG tablet Take 1 tablet (40 mg total) by mouth daily. 06/29/21   [name], MD  predniSONE (DELTASONE) 10 MG tablet Take 1 tablet by mouth daily. 07/06/21   [name], MD  pregabalin (LYRICA) 100 MG capsule Take 1 capsule (100 mg total) by mouth 2 (two) times daily. 06/23/21   [name], MD  Respiratory Therapy Supplies (FLUTTER) DEVI Use 4 times daily 04/27/18   [name], MD      Allergies    Patient has no known allergies.    Review of Systems   Review of Systems  Respiratory:  Positive for shortness of breath.   All other systems reviewed and are negative.   Physical Exam Updated Vital Signs BP (!) 159/109 (BP Location: Right Arm)  Pulse (!) 135   Temp 99 F (37.2 C) (Axillary)   Resp (!) 31   Ht 5\' 9"  (1.753 m)   Wt 94 kg   SpO2 99%   BMI 30.60 kg/m  Physical Exam Vitals and nursing note reviewed.  Constitutional:      General: He is not in acute distress.    Appearance: Normal appearance.     Comments: Alert, tachypneic, in clear respiratory distress  HENT:     Head: Normocephalic and atraumatic.  Eyes:     Conjunctiva/sclera: Conjunctivae normal.     Pupils: Pupils are equal, round, and reactive to light.  Cardiovascular:     Rate and  Rhythm: Regular rhythm. Tachycardia present.     Heart sounds: Normal heart sounds.  Pulmonary:     Effort: Tachypnea and respiratory distress present.     Comments: Significant bilateral expiratory wheezes in all lung fields Abdominal:     General: There is no distension.     Palpations: Abdomen is soft.     Tenderness: There is no abdominal tenderness.  Musculoskeletal:        General: No deformity. Normal range of motion.     Cervical back: Normal range of motion and neck supple.  Skin:    General: Skin is warm and dry.  Neurological:     General: No focal deficit present.     Mental Status: He is alert and oriented to person, place, and time.     ED Results / Procedures / Treatments   Labs (all labs ordered are listed, but only abnormal results are displayed) Labs Reviewed  CULTURE, BLOOD (ROUTINE X 2)  CULTURE, BLOOD (ROUTINE X 2)  RESP PANEL BY RT-PCR (FLU A&B, COVID) ARPGX2  COMPREHENSIVE METABOLIC PANEL  CBC WITH DIFFERENTIAL/PLATELET  ETHANOL  BRAIN NATRIURETIC PEPTIDE  URINALYSIS, ROUTINE W REFLEX MICROSCOPIC  RAPID URINE DRUG SCREEN, HOSP PERFORMED  LACTIC ACID, PLASMA  LACTIC ACID, PLASMA  I-STAT CHEM 8, ED  I-STAT VENOUS BLOOD GAS, ED  TROPONIN I (HIGH SENSITIVITY)    EKG EKG Interpretation  Date/Time:  Sunday July 12 2021 07:37:47 EDT Ventricular Rate:  132 PR Interval:  174 QRS Duration: 96 QT Interval:  299 QTC Calculation: 443 R Axis:   58 Text Interpretation: Sinus tachycardia with irregular rate Confirmed by Dene Gentry [phone]) on 07/12/2021 7:38:51 AM  Radiology No results found.  Procedures Procedures    Medications Ordered in ED Medications  ipratropium-albuterol (DUONEB) 0.5-2.5 (3) MG/3ML nebulizer solution 3 mL (has no administration in time range)  LORazepam (ATIVAN) injection 0.5 mg (0.5 mg Intravenous Given 07/12/21 0748)    ED Course/ Medical Decision Making/ A&P                           Medical Decision Making Amount  and/or Complexity of Data Reviewed Labs: ordered. Radiology: ordered.  Risk Prescription drug management.    Medical Screen Complete  This patient presented to the ED with complaint of shortness of breath.  This complaint involves an extensive number of treatment options. The initial differential diagnosis includes, but is not limited to, COPD exacerbation, pneumonia, pneumothorax, metabolic abnormality, etc.  This presentation is: Acute, Chronic, Self-Limited, Previously Undiagnosed, Uncertain Prognosis, Complicated, Systemic Symptoms, and Threat to Life/Bodily Function  Patient with longstanding history of COPD presents with significant shortness of breath.  EMS reports significant difficulty moving air on their initial eval.  Patient given 125 Solu-Medrol, 2 g of mag, 2  DuoNeb treatments prior to arrival.  Patient placed on BiPAP on arrival.  Additional DuoNeb treatment started here in the ED.  Additionally, anxiolysis provided with Ativan.  Patient will require admission.  Interventions by EMS in the ED appear to be improving his respiratory status.  He reports feeling comfortable on BiPAP at time of admission.  Work-up is concerning for likely infiltrates in the left lower and left midlung.  Patient with a white count of 22.  Lactic acid 2.2.  Broad-spectrum antibiotics initiated.    Admitting medicine service aware of case.        Co morbidities that complicated the patient's evaluation  COPD   Additional history obtained:  Additional history obtained from EMS External records from outside sources obtained and reviewed including prior ED visits and prior Inpatient records.    Lab Tests:  I ordered and personally interpreted labs.  The pertinent results include: CBC, CMP, i-STAT Chem-8, i-STAT VBG, lactic acid, troponin, BNP   Imaging Studies ordered:  I ordered imaging studies including chest x-ray I independently visualized and interpreted obtained  imaging which showed likely infiltrates left mid and lower lung I agree with the radiologist interpretation.   Cardiac Monitoring:  The patient was maintained on a cardiac monitor.  I personally viewed and interpreted the cardiac monitor which showed an underlying rhythm of: Sinus tach   Medicines ordered:  I ordered medication including DuoNeb treatment, Ativan, antibiotics for bronchospasm, anxiety, infection Reevaluation of the patient after these medicines showed that the patient: improved Problem List / ED Course:  Dyspnea, COPD exacerbation, pneumonia   Reevaluation:  After the interventions noted above, I reevaluated the patient and found that they have: improved  Disposition:  After consideration of the diagnostic results and the patients response to treatment, I feel that the patent would benefit from admission.   CRITICAL CARE Performed by: Valarie Merino   Total critical care time: 30 minutes  Critical care time was exclusive of separately billable procedures and treating other patients.  Critical care was necessary to treat or prevent imminent or life-threatening deterioration.  Critical care was time spent personally by me on the following activities: development of treatment plan with patient and/or surrogate as well as nursing, discussions with consultants, evaluation of patient's response to treatment, examination of patient, obtaining history from patient or surrogate, ordering and performing treatments and interventions, ordering and review of laboratory studies, ordering and review of radiographic studies, pulse oximetry and re-evaluation of patient's condition.          Final Clinical Impression(s) / ED Diagnoses Final diagnoses:  Dyspnea, unspecified type    Rx / DC Orders ED Discharge Orders     None         Valarie Merino, MD 07/12/21 [phone]

## 2021-07-12 NOTE — Progress Notes (Signed)
BIPAP not needed at this time. No distress noted.

## 2021-07-12 NOTE — Progress Notes (Signed)
Pt arrived to 4E from ED. A&Ox4. CHG bath. Tele applied, CCMD called. Skin c/d/I. On 4LNC O2. Pt oriented to room and call light in reach.  Raelyn Number, RN

## 2021-07-12 NOTE — ED Triage Notes (Signed)
Hx of COPD and on 2LPM [place] at home. Here for shortness of breath, audible wheezing. 2 duoneb, 2g Mag, 125mg  Solu medrol given en route.   EMS vitals : Bp 199/90 HR 120 O2 96 RR 28

## 2021-07-12 NOTE — Progress Notes (Signed)
Pharmacy Antibiotic Note  DONTEZ HAUSS is a 61 y.o. male admitted on 07/12/2021 with SOB, concern for PNA  SCr normal  Plan: Vanc 1750 mg q 12h quc 535 Cefepime 2 g q8h Monitor renal fx cx lvls prn  Height: 5\' 9"  (175.3 cm) Weight: 94 kg (207 lb 3.7 oz) IBW/kg (Calculated) : 70.7  Temp (24hrs), Avg:99 F (37.2 C), Min:99 F (37.2 C), Max:99 F (37.2 C)  Recent Labs  Lab 07/12/21 0742 07/12/21 0754  WBC 22.5*  --   CREATININE 0.84 0.80  LATICACIDVEN 2.2*  --     Estimated Creatinine Clearance: 111.1 mL/min (by C-G formula based on SCr of 0.8 mg/dL).    No Known Allergies  Barth Kirks, PharmD, BCCCP Clinical Pharmacist [phone]  Please check AMION for all [place] numbers  07/12/2021 9:16 AM

## 2021-07-12 NOTE — H&P (Cosign Needed Addendum)
NAME:  James [name], MRN:  [id number], DOB:  [birth year], LOS: 0 ADMISSION DATE:  07/12/2021, Primary: James Stain, MD  CHIEF COMPLAINT:  dyspnea   Medical Service: Internal Medicine Teaching Service         Attending Physician: Dr. Sid Falcon, MD    First Contact: Dr. Jodell [name] Pager: [phone]  Second Contact: Dr. Coy [name]  Pager: [phone]       After Hours (After 5p/  First Contact Pager: [phone]  weekends / holidays): Second Contact Pager: [place]   [name] is 61yo person with chronic respiratory failure 2/2 COPD on chronic 2L supplemental oxygen, type II diabetes mellitus, alcohol use disorder, hypertension presenting to [facility] 7/9 for acute dyspnea that started yesterday. History limited given patient is on BiPAP at time of interview. He has not been feeling well over the last few days and has had to increase his oxygen requirement at home. He has not had any fevers or chills that he can remember. He did drink a six pack of beers last night, but reports he did not pass out and remembers the night well. Overnight became more acutely dyspneic and EMS was subsequently called. Denies any sick contacts, nausea, vomiting, abdominal pain, diarrhea.   PCP: James Stain, MD  ED COURSE   Upon arrival of EMS, patient was given IV magnesium, solumedrol, and two nebulizer treatments of DuoNebs. On arrival to [facility], patient afebrile, tachypneic, hypertensive, sating well on BiPAP. Imaging consistent with left lower lung consolidation. IMTS was subsequently consulted for admission.   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   HOME MEDICATIONS   Prior to Admission medications   Medication Sig Start Date End Date Taking? Authorizing Provider  albuterol (PROVENTIL) (2.5 MG/3ML) 0.083% nebulizer solution Take 3 mLs (2.5 mg total) by nebulization every 6 (six) hours as needed for wheezing or shortness of breath. 05/12/21 05/12/22  James Stain, MD  albuterol (VENTOLIN HFA) 108 (90 Base) MCG/ACT inhaler Inhale 2 puffs into the lungs every 4 (four) hours as needed. 12/22/20   James Stain, MD  clobetasol cream (TEMOVATE) 7.42 % Apply 1 Application topically 2 (two) times daily. 06/29/21   James Stain, MD  Fluticasone-Umeclidin-Vilant (TRELEGY ELLIPTA) 100-62.5-25 MCG/ACT AEPB Inhale 1 puff into the lungs daily. 06/29/21   James Stain, MD  furosemide (LASIX) 20 MG tablet Take 1 tablet (20 mg total) by mouth daily as needed for edema. 04/29/21 04/29/22  James Stain, MD  glipiZIDE (GLUCOTROL XL) 5 MG 24 hr tablet Take 1 tablet (5 mg total) by mouth daily with breakfast. 06/29/21   James Stain, MD  guaiFENesin (ROBITUSSIN) 100 MG/5ML liquid Take 5 mLs by mouth every 4 (four) hours as needed for cough or to loosen phlegm. Patient not taking: Reported on 06/05/2021 05/10/21   [name], MD  losartan (COZAAR) 50 MG tablet Take 1 tablet (50 mg total) by mouth daily. 06/29/21   James Stain, MD  metoprolol succinate (TOPROL-XL) 25 MG 24 hr tablet Take 1 tablet (25 mg total)  by mouth daily. 06/29/21 06/29/22  James Stain, MD  pantoprazole (PROTONIX) 40 MG tablet Take 1 tablet (40 mg total) by mouth daily. 06/29/21   James Stain, MD  predniSONE (DELTASONE) 10 MG tablet Take 1 tablet by mouth daily. 07/06/21   James Stain, MD  pregabalin (LYRICA)  100 MG capsule Take 1 capsule (100 mg total) by mouth 2 (two) times daily. 06/23/21   James Stain, MD  Respiratory Therapy Supplies (FLUTTER) DEVI Use 4 times daily 04/27/18   James Stain, MD    ALLERGIES   Allergies as of 07/12/2021   (No Known Allergies)    SOCIAL HISTORY   Patient lives at home in [place] by himself. He has a friend that comes and drives him places, as he does not have the means for transportation. Can complete ADL's and IADL's independently at baseline. Denies current or previous tobacco use. Mentions weekend alcohol use, drank six pack last night. Denies recreational drug use.   FAMILY HISTORY   His family history includes COPD in his sister; Heart disease in his father.   REVIEW OF SYSTEMS   ROS per history of present illness.  PHYSICAL EXAMINATION   Blood pressure (!) 147/81, pulse (!) 124, temperature 99 F (37.2 C), temperature source Axillary, resp. rate (!) 28, height 5\' 9"  (1.753 m), weight 94 kg, SpO2 95 %.    Filed Weights   07/12/21 0745  Weight: 94 kg   GENERAL: Does not appear well-kept with ripped pants with stains. Resting in bed w/ BiPAP in mild distress HENT: Normocephalic, atraumatic. Neck supple. EYES: No scleral icterus or conjunctival injection. Vision grossly in tact. CV: Tachycardic, regular rhythm. No murmurs appreciated. Warm extremities.  PULM: Normal work of breathing on BiPAP. End-expiratory wheezing appreciated throughout. Rhonchus breath sounds in left lower lung field.  GI: Abdomen soft, non-tender, non-distended. Normoactive bowel sounds. MSK: Normal bulk, tone. No pitting edema bilateral lower extremities.  SKIN: Warm, dry. Thickened, yellowed toenails bilaterally. NEURO: Awake, alert, conversing appropriately. Grossly non-focal. PSYCH: Normal mood, affect, speech.  SIGNIFICANT DIAGNOSTIC TESTS   ECG: Tachycardic, irregular rhythm. Chronic T-wave inversions in V1-V2.  I personally reviewed patient's ECG  with my interpretation as above.  CXR: Focal consolidation in left lower lung fields. No effusions.   I personally reviewed patient's CXR with my interpretation as above.  LABS      Latest Ref Rng & Units 07/12/2021    7:55 AM 07/12/2021    7:54 AM 07/12/2021    7:42 AM  CBC  WBC 4.0 - 10.5 K/uL   22.5   Hemoglobin 13.0 - 17.0 g/dL 15.0  15.0  14.4   Hematocrit 39.0 - 52.0 % 44.0  44.0  45.3   Platelets 150 - 400 K/uL   306       Latest Ref Rng & Units 07/12/2021    7:55 AM 07/12/2021    7:54 AM 07/12/2021    7:42 AM  BMP  Glucose 70 - 99 mg/dL  127  123   BUN 6 - 20 mg/dL  15  12   Creatinine 0.61 - 1.24 mg/dL  0.80  0.84   Sodium 135 - 145 mmol/L 141  141  144   Potassium 3.5 - 5.1 mmol/L 3.9  4.0  4.0   Chloride 98 - 111 mmol/L  107  105   CO2 22 - 32 mmol/L   28   Calcium 8.9 - 10.3 mg/dL   9.4  CONSULTS   N/a  ASSESSMENT   Monica Zahler is 61yo person with chronic respiratory failure 2/2 COPD on chronic 2L supplemental oxygen, type II diabetes mellitus, alcohol use disorder, hypertension admitted 7/9 with acute on chronic respiratory failure due to COPD exacerbation and community-acquired pneumonia.   PLAN   Active Problems:   COPD with acute exacerbation (HCC)  #Acute on chronic respiratory failure #COPD exacerbation due to community-acquired pneumonia Patient presenting with acute dyspnea with exam and laboratory findings consistent with COPD exacerbation due to pneumonia in left lower lung fields. On arrival to ED, patient tachypenic, tachycardic, and hypertensive on BiPAP. Initial VBG did not show acidosis. On my examination, patient appears more comfortable on BiPAP and is mentating well. While I was in the room, we were able to transition to nasal cannula. Lactic acid initially elevated to 2.2, pending repeat following IV fluids. Patient is on chronic steroids due to chronic respiratory failure and has multiple hospitalization for pneumonia, most recently in May. At  that time concern for possible multi-drug resistant organisms due to his immunocompromised state and recurrent hospitalizations. Therefore we will continue with broadened coverage - I have also added azithromycin for atypical coverage. For his COPD will plan for triple therapy nebulizers and q4 short-acting bronchodilators.  - Continue vancomycin, cefepime - Start azithromycin 500mg  daily - Follow-up repeat lactic acid - Follow-up Strep pneumo, Legionella urine antigens - Follow-up blood cultures, sputum cultures - Start prednisone 40mg  daily tomorrow - Aformoterol nebulizer twice daily - Budesonide nebulizer twice daily - Yupelri nebulizer daily - DuoNebs q4h while awake - Albuterol nebulizer q6h PRN - SpO2 goal 88-92%  #Atypical chest pain During our interview, patient reports sharp, mid-sternal chest pain that has occurred since he arrived to the hospital. ECG without ischemic changes, initial troponin negative. I believe his chest pain is due to acute respiratory failure from COPD, pneumonia. Will follow-up troponin. - Follow-up repeat troponin  #Type II diabetes mellitus Last A1c 6.5% in December, will repeat this today. Sugars at goal, will place on sliding scale. - Follow-up repeat A1c - SSI  #Hypertension Mildly hypertensive currently, SBP's in 140-150's since arrival. Given acute illness will hold antihypertensives, could re-start in AM if patient has improved. - Hold home antihypertensives  #Alcohol use disorder Per chart review, patient has previous history of heavy drinking. He has reported to his PCP that he has remained abstinent. However, he does report drinking a six pack last year. Per chart review, he does have a history of alcohol withdrawal. - CIWA without Ativan - Thiamine, folate, multivitamin   BEST PRACTICE   DIET: regular IVF: n/a DVT PPX: xarelto BOWEL: senokot-s CODE: full FAM COM: n/a   DISPO: Admit patient to Observation with expected length of  stay less than 2 midnights.  Sanjuan Dame, MD Internal Medicine Resident PGY-3 Pager [phone] 07/12/2021 10:01 AM

## 2021-07-12 NOTE — ED Notes (Signed)
Refused to be changed into a gown at this time. Refused to remove his shorts. Pt became anxious when asked by staff to remove shorts. Urinal provided for UA. Only 1 set of blood cultures were drawn. MD is aware. Phlebotomist tried multiple times with no success. Will continue to monitor.

## 2021-07-12 NOTE — ED Notes (Signed)
Unable to draw blood cultures. Attempted x 1. Phlebotomist made aware to attempt blood cultures at this time.

## 2021-07-12 NOTE — ED Notes (Signed)
Dr.Braswell at bedside this time. Switched to 4LPM [place] with MD order. Pt tolerating well. O2 at 98% on 4LPM [place] with RR at 25-30 cpm. Will continue to monitor.

## 2021-07-13 DIAGNOSIS — J189 Pneumonia, unspecified organism: Secondary | ICD-10-CM | POA: Diagnosis present

## 2021-07-13 DIAGNOSIS — Z20822 Contact with and (suspected) exposure to covid-19: Secondary | ICD-10-CM | POA: Diagnosis present

## 2021-07-13 DIAGNOSIS — R0602 Shortness of breath: Secondary | ICD-10-CM | POA: Diagnosis not present

## 2021-07-13 DIAGNOSIS — Z79899 Other long term (current) drug therapy: Secondary | ICD-10-CM | POA: Diagnosis not present

## 2021-07-13 DIAGNOSIS — Z87891 Personal history of nicotine dependence: Secondary | ICD-10-CM | POA: Diagnosis not present

## 2021-07-13 DIAGNOSIS — E119 Type 2 diabetes mellitus without complications: Secondary | ICD-10-CM | POA: Diagnosis present

## 2021-07-13 DIAGNOSIS — Z7951 Long term (current) use of inhaled steroids: Secondary | ICD-10-CM | POA: Diagnosis not present

## 2021-07-13 DIAGNOSIS — Z9981 Dependence on supplemental oxygen: Secondary | ICD-10-CM | POA: Diagnosis not present

## 2021-07-13 DIAGNOSIS — R072 Precordial pain: Secondary | ICD-10-CM | POA: Diagnosis present

## 2021-07-13 DIAGNOSIS — I10 Essential (primary) hypertension: Secondary | ICD-10-CM | POA: Diagnosis present

## 2021-07-13 DIAGNOSIS — J441 Chronic obstructive pulmonary disease with (acute) exacerbation: Secondary | ICD-10-CM | POA: Diagnosis not present

## 2021-07-13 DIAGNOSIS — Z825 Family history of asthma and other chronic lower respiratory diseases: Secondary | ICD-10-CM | POA: Diagnosis not present

## 2021-07-13 DIAGNOSIS — Z7984 Long term (current) use of oral hypoglycemic drugs: Secondary | ICD-10-CM | POA: Diagnosis not present

## 2021-07-13 DIAGNOSIS — F1010 Alcohol abuse, uncomplicated: Secondary | ICD-10-CM | POA: Diagnosis present

## 2021-07-13 DIAGNOSIS — J440 Chronic obstructive pulmonary disease with acute lower respiratory infection: Secondary | ICD-10-CM | POA: Diagnosis present

## 2021-07-13 DIAGNOSIS — Z7952 Long term (current) use of systemic steroids: Secondary | ICD-10-CM | POA: Diagnosis not present

## 2021-07-13 DIAGNOSIS — J9621 Acute and chronic respiratory failure with hypoxia: Secondary | ICD-10-CM | POA: Diagnosis present

## 2021-07-13 LAB — GLUCOSE, CAPILLARY
Glucose-Capillary: 104 mg/dL — ABNORMAL HIGH (ref 70–99)
Glucose-Capillary: 138 mg/dL — ABNORMAL HIGH (ref 70–99)
Glucose-Capillary: 183 mg/dL — ABNORMAL HIGH (ref 70–99)

## 2021-07-13 LAB — BASIC METABOLIC PANEL
Anion gap: 14 (ref 5–15)
BUN: 17 mg/dL (ref 6–20)
CO2: 27 mmol/L (ref 22–32)
Calcium: 9.2 mg/dL (ref 8.9–10.3)
Chloride: 102 mmol/L (ref 98–111)
Creatinine, Ser: 1.03 mg/dL (ref 0.61–1.24)
GFR, Estimated: >60 mL/min (ref >60)
Glucose, Bld: 151 mg/dL — ABNORMAL HIGH (ref 70–99)
Potassium: 4.3 mmol/L (ref 3.5–5.1)
Sodium: 143 mmol/L (ref 135–145)

## 2021-07-13 LAB — HEMOGLOBIN A1C
Hgb A1c MFr Bld: 6.4 % — ABNORMAL HIGH (ref 4.8–5.6)
Mean Plasma Glucose: 136.98 mg/dL

## 2021-07-13 LAB — CBC
HCT: 38.4 % — ABNORMAL LOW (ref 39.0–52.0)
Hemoglobin: 12.3 g/dL — ABNORMAL LOW (ref 13.0–17.0)
MCH: 30.3 pg (ref 26.0–34.0)
MCHC: 32 g/dL (ref 30.0–36.0)
MCV: 94.6 fL (ref 80.0–100.0)
Platelets: 278 10*3/uL (ref 150–400)
RBC: 4.06 MIL/uL — ABNORMAL LOW (ref 4.22–5.81)
RDW: 13.6 % (ref 11.5–15.5)
WBC: 21.7 10*3/uL — ABNORMAL HIGH (ref 4.0–10.5)
nRBC: 0 % (ref 0.0–0.2)

## 2021-07-13 LAB — HIV ANTIBODY (ROUTINE TESTING W REFLEX): HIV Screen 4th Generation wRfx: NONREACTIVE

## 2021-07-13 MED ORDER — PANTOPRAZOLE SODIUM 40 MG PO TBEC
40.0000 mg | DELAYED_RELEASE_TABLET | Freq: Every day | ORAL | Status: DC
  Administered 2021-07-14: 40 mg via ORAL
  Filled 2021-07-13: qty 1

## 2021-07-13 MED ORDER — VANCOMYCIN HCL 1500 MG/300ML IV SOLN
1500.0000 mg | INTRAVENOUS | Status: DC
  Administered 2021-07-14: 1500 mg via INTRAVENOUS
  Filled 2021-07-13: qty 300

## 2021-07-13 NOTE — Hospital Course (Addendum)
James [name] is 61 yo male with a PMH of chronic respiratory failure 2/2 COPD on chronic 2L O2 requirement, T2DM, alcohol use disorder, and HTN presenting to [facility] 7/9 for 1 day of acute dyspnea and admitted for acute on chronic respiratory failure 2/2 COPD exacerbation and CAP.  ED course: Patient drank a six-pack of beers on the evening of 7/8 and became progressively dyspneic overnight. He had increased O2 requirement at home the past few days. On the morning of 7/9, he called EMS. Upon arrival of EMS, patient was given IV magnesium, solumedrol, and two nebulizer treatments of DuoNebs, but remained SOB. On arrival to [facility], patient was afebrile, tachypneic, hypertensive, sating well on BiPAP. CXR was obtained and was consistent with left lower lung consolidation. IMTS was subsequently consulted for admission.  #Acute on chronic respiratory failure #COPD exacerbation due to community-acquired pneumonia Patient presented with acute dyspnea with exam and laboratory findings consistent with COPD exacerbation due to pneumonia in left lower lung fields. On arrival to ED, patient was tachypenic, tachycardic, and hypertensive on BiPAP. Initial VBG did not show acidosis. Lactic acid was initially elevated to 2.2, but normalized to 1.8 with IV fluids. Given concern for multi-drug resistant organisms due to his immunocompromised state and recurrent hospitalizations, broad and atypical bug coverage with cefepime, vancomycin, and azithromycin was started. Blood cultures, MRSA PCR, and Strep pneumo as well as Legionella urine antigens were obtained, resulting in *** prior to discharge. COPD exacerbation was treated with triple therapy nebulizers and q4 short-acting bronchodilators. Patient was started on prednisone 40 mg PO and he completed a *** oral steroid course at discharge. Patient improved rapidly with treatment*** and was moved from BiPAP to nasal cannula on admission and then gradually weaned to 4L O2,  temporarily placed on 5L overnight, and then back to 2L baseline. He passed ambulation trial on 2L prior to discharge ***. He was discharged on ***L O2 and ____.   #Atypical chest pain On admission, patient reported a sharp, mid-sternal chest pain that began when he arrived to the hospital. This pain does not appear to have cardiac etiology given EKG without ischemic changes as well as negative initial and follow up troponin. Presentation was consistent with pleuritic pain due to acute respiratory failure from COPD and pneumonia.   #Type II diabetes mellitus A1c was found to be 6.4% on admission, nearly unchanged from 6.5% in December. Patient was maintained on SSI during admission.   #Hypertension The patient was hypertensive on admission to 235-573U systolic, but was normotensive the next morning. Held patient's home losartan and metoprolol given acute illness. Patient remained normotensive at discharge and was restarted on home meds at that time.***   #Alcohol use disorder Per chart review, patient has previous history of heavy drinking and reports drinking a six pack each day on the weekend. Per chart review, the patient also has a history of alcohol withdrawal. Hemoglobin was found to be borderline low at 12.3, with normocytic MCV and may be related to vitamin deficiencies due to chronic alcohol use. Patient placed on CIWA without ativan on admission and started on thiamine, folate, and multivitamin supplementation. Patient experienced mild agitation during admission, but presentation was not consistent with withdrawal or delirium tremens. He was counseled on alcohol cessation prior to discharge. Follow up outpatient.  #Chronic steroid use Patient has a history of chronic steroid use due to chronic respiratory failure and has had repeated hospitalizations for pneumonia. Given his excess alcohol use in addition to chronic use of  glucocorticoids, a pantoprazole 40 mg daily was started for PUD  prophylaxis. Vitamin D level was obtained due to osteoporosis risk and showed ***. Patient was counseled on the benefits of pneumococcal vaccine given his immunocompromised status, recurrent infections, and COPD and he chose to ***.  #Increasing creatinine Patient's creatinine increased from 0.84 on admission to 1.03 one day later. Notably, patient was taking vancomycin for pneumonia Tx during that time. Creatinine was *** on follow up the next day and ***.   #Lung nodule Chart review on admission revealed that patient's CT without contrast on previous admission (06/10/21) showed several new pulmonary nodules. Most severe finding was a 6 mm right solid pulmonary nodule. IR recommend a non-contrast chest CT at 3-6 months out and then another non-contrast chest CT at 18-24 months. Counseled patient and ensured plans for appropriate outpatient follow-up upon discharge***.

## 2021-07-13 NOTE — Progress Notes (Signed)
Subjective: James [name] is 61 yo male with a PMH of chronic respiratory failure 2/2 COPD on chronic 2L O2 requirement, T2DM, alcohol use disorder, and HTN presenting to [facility] 7/9 for 1 day of acute dyspnea and admitted for acute on chronic respiratory failure 2/2 COPD exacerbation and CAP.  Overnight events: - Bumped up to 5L due to shortness of breath while ambulating, saturating around 93-94% - RN reports concern for increased anxiety and tremors, noticed that he was perhaps having some concerning symptoms given his CIWA precautions  This morning, the patient says he is doing better and not having difficulty breathing. He appears to be in a good mood. He says that he is feeling a little nervous/anxious for no identifiable reason. He endorses the same atypical midsternal chest pain he noted on admission, reporting that it worsens with deep breathing and coughing. The patient rates the pain 2/10 and says that it does not radiate and is not constant. The patient also endorses having some shakiness and tremors in his body, but states that these symptoms have been going on for multiple weeks and attributes the symptoms to his steroid treatment. He denies chills, sweats, headache, cough, nausea, or confusion.  Objective:  Vital signs in last 24 hours: Vitals:   07/12/21 2007 07/12/21 2040 07/13/21 0010 07/13/21 0350  BP: (!) 117/57  139/88 128/67  Pulse: 87  84 62  Resp: 20  20 18   Temp: 98.7 F (37.1 C)  98.4 F (36.9 C) 98.2 F (36.8 C)  TempSrc: Oral  Oral Oral  SpO2: 96% 96% 97% 96%  Supplemental O2: 4L 4L 4L 5L  Height:        Intake/Output Summary (Last 24 hours) at 07/13/2021 0703 Last data filed at 07/12/2021 1849 Gross per 24 hour  Intake 1050 ml  Output --  Net 1050 ml   General:  Patient resting comfortably in bed, in no acute distress. Cardiovascular:  RRR, no murmurs/rubs/gallops. Normal S1/S2. 2+ radial and pedal pulses bilaterally. Nontender to palpation over  precordium. Respiratory:  Normal WOB on 2L O2 by nasal cannula. Mild diffuse end-expiratory wheezing bilaterally. Mild harsh breath sounds in left lower lung. Abdominal:  No tenderness to palpation, rebound, or guarding. Extremities:  Mild tremor in bilateral arms when outstretched. Skin:  Warm, dry. Reddish stain on L knee (patient attributes to "dye"), nontender to palpation, no edema. Psych:  Alert and oriented x3. Pleasant and appropriate.     Latest Ref Rng & Units 07/13/2021   12:18 AM 07/12/2021    7:55 AM 07/12/2021    7:54 AM  CBC  WBC 4.0 - 10.5 K/uL 21.7     Hemoglobin 13.0 - 17.0 g/dL 12.3  15.0  15.0   Hematocrit 39.0 - 52.0 % 38.4  44.0  44.0   Platelets 150 - 400 K/uL 278         Latest Ref Rng & Units 07/13/2021   12:18 AM 07/12/2021    7:55 AM 07/12/2021    7:54 AM  BMP  Glucose 70 - 99 mg/dL 151   127   BUN 6 - 20 mg/dL 17   15   Creatinine 0.61 - 1.24 mg/dL 1.03   0.80   Sodium 135 - 145 mmol/L 143  141  141   Potassium 3.5 - 5.1 mmol/L 4.3  3.9  4.0   Chloride 98 - 111 mmol/L 102   107   CO2 22 - 32 mmol/L 27     Calcium 8.9 -  10.3 mg/dL 9.2      CBG (last 3)  Recent Labs    07/12/21 1652 07/12/21 2231 07/13/21 0622  GLUCAP 285* 132* 104*   Hgb A1c 6.4%; baseline 6.4-6.8  Pending labs: - Strep pneumoniae, Legionella pneumophilia urine antigens - Blood Cx - Sputum gram stain - MRSA PCR, nasal  Imaging: No new studies.  Assessment/Plan:  Principal Problem:   COPD with acute exacerbation (HCC) Active Problems:   Hypertension   Chronic respiratory failure with hypoxia (HCC)   Type 2 diabetes mellitus without complication, without long-term current use of insulin ([place])   Community acquired pneumonia  James [name] is 61 yo male with a PMH of chronic respiratory failure 2/2 COPD on chronic 2L O2 requirement, T2DM, alcohol use disorder, and HTN presenting to [facility] 7/9 for 1 day of acute dyspnea and admitted for acute on chronic respiratory failure 2/2  COPD exacerbation and CAP.  #Acute on chronic respiratory failure #COPD exacerbation due to community-acquired pneumonia Patient presenting with acute dyspnea with exam and laboratory findings consistent with COPD exacerbation due to pneumonia in left lower lung fields. On repeat exam today, patient appears be improving and he was able to tolerate weaning O2 to 2L, which is his baseline at home. Will have him ambulate with nursing staff later to elicit any desaturation with activity. Patient has not had AMS during admission and initial VBG showed no acidosis. Lactic acid initially elevated to 2.2, but dropped to 1.8 after IV fluids. The patient has a history of chronic steroid use due to chronic respiratory failure and has had repeated hospitalizations for pneumonia. Given concern for multi-drug resistant organisms due to his immunocompromised state and recurrent hospitalizations, we will continue with broad and atypical bug coverage. Will need to recollect blood cultures and urine antigen labs issues with lab collection. Once these studies result, will consider narrowing antibiotic coverage as indicated. Concurrent treatment of COPD will be accomplished with triple therapy nebulizers and q4 short-acting bronchodilators. He will start oral prednisone today. - Continue vancomycin, cefepime; can de-escalate vancomycin if MRSA screen is negative - Continue azithromycin 500 mg daily  - Recollect Strep pneumo, Legionella urine antigens - Recollect blood cultures - Recollect MRSA PCR, nasal - Continue prednisone 40 mg daily (day 1 of 4) - Aformoterol nebulizer twice daily - Budesonide nebulizer twice daily - Yupelri nebulizer daily - DuoNebs q4h while awake - Albuterol nebulizer q6h PRN - SpO2 goal 88-92% - Ambulate patient with oxygen to evaluate baseline need with activity   #Atypical chest pain The patient reports sharp midsternal pain that is worth with cough and deep breaths. This pain does not  appear to have cardiac etiology given EKG without ischemic changes as well as negative initial and follow up troponin. Presentation is consistent with pain due to acute respiratory failure from COPD and pneumonia.  #Type II diabetes mellitus A1c is 6.4%, nearly unchanged from 6.5% in December. Diabetes appears well-controlled. Sugars at goal, will maintain on sliding scale insulin. - SSI   #Hypertension The patient has been normotensive this morning, though he was briefly hypertensive yesterday on admission. Given acute illness will hold antihypertensives, but will consider restarting these medications if patient improves or becomes severely hypertensive. - Continue to hold home antihypertensives (Losartan 50 mg PO daily & Metoprolol succinate 25 mg PO daily)   #Alcohol use disorder Per chart review, patient has previous history of heavy drinking and reports drinking a six pack each day on the weekend. He has reported to his  PCP that he has remained abstinent recently. Per chart review, the patient also has a history of alcohol withdrawal. Today, he reports slight agitation and continues to have tremors, which are chronic. Overall, his clinical picture is inconsistent with alcohol withdrawal and delirium tremens. Hemoglobin borderline low at 12.3, with normocytic MCV and may be related to chronic alcohol use. - Continue CIWA without Ativan - Continue thiamine, folate, multivitamin  #Chronic steroid use The patient has a history of chronic steroid use due to chronic respiratory failure and has had repeated hospitalizations for pneumonia. Chronic steroid use carries risks of immunosuppression and metabolic effects. The patient's diabetes appears well managed with an A1c of 6.4%. Given his excess alcohol use in addition to chronic use of glucocorticoids, a PPI would be beneficial to prevent PUD and gastric bleeding. Additionally, chronic steroid use is associated with osteoporosis. It would be prudent  to obtain a vitamin D level on the patient and consider supplementation if low. Additionally, consider bisphosphonate treatment if patient is osteoporotic on further workup in the future. Considering the patient's immunocompromised status, recurrent infections, and COPD, he is a strong candidate for pneumococcal vaccine on follow-up. - Start pantoprazole 40 mg daily PO - Obtain vitamin D levels and supplement if indicated - Counsel patient on benefits of pneumococcal vaccine, follow-up tomorrow  #Increasing creatinine Patient's creatinine has increased from 0.84 on admission to 1.03 one day later. Notably, patient has been taking vancomycin for pneumonia Tx during that time. Will continue to follow and treat if appropriate. - Follow up BMP tomorrow  #Lung nodule Patient's CT without contrast on previous admission (06/10/21) showed several new pulmonary nodules. Most severe: 6 mm right solid pulmonary nodule. IR recommend a non-contrast Chest CT at 3-6 months out and then another non-contrast Chest CT at 18-24 months. - Counsel patient and ensure appropriate outpatient follow-up   LOS: 0 days   Cristobal Advani, Medical Student 07/13/2021, 7:03 AM  Pager number: [phone]

## 2021-07-13 NOTE — Progress Notes (Signed)
Pharmacy Antibiotic Note  James [name] is a 61 y.o. male admitted on 07/12/2021 with SOB, concern for PNA  On day#2 of antibiotics - WBC down slightly to 21, Scr up from 0.8 to 1.03. Afebrile.   Plan: Change vanc to 1500 mg q24h (estAUC 471) Cefepime 2 g q8h Monitor renal fx cx lvls prn - follow up MRSA PCR to stop vancomycin if negative  Height: 5\' 9"  (175.3 cm) Weight: 94 kg (207 lb 3.7 oz) IBW/kg (Calculated) : 70.7  Temp (24hrs), Avg:98.1 F (36.7 C), Min:96.2 F (35.7 C), Max:98.9 F (37.2 C)  Recent Labs  Lab 07/12/21 0742 07/12/21 0754 07/12/21 0952 07/13/21 0018  WBC 22.5*  --   --  21.7*  CREATININE 0.84 0.80  --  1.03  LATICACIDVEN 2.2*  --  1.8  --      Estimated Creatinine Clearance: 86.3 mL/min (by C-G formula based on SCr of 1.03 mg/dL).    No Known Allergies  Antonietta Jewel, PharmD, BCCCP Clinical Pharmacist  Phone: [phone] 07/13/2021 2:55 PM  Please check AMION for all [place] phone numbers After 10:00 PM, call [place] [phone]

## 2021-07-13 NOTE — Progress Notes (Signed)
Mobility Specialist: Progress Note SATURATION QUALIFICATIONS: (This note is used to comply with regulatory documentation for home oxygen)  Patient Saturations on Room Air at Rest = 92%  Patient Saturations on Room Air while Ambulating = 90%  Patient Saturations on -- Liters of oxygen while Ambulating = N/A  Emergency planning/management officer Specialist [address]: [phone]

## 2021-07-13 NOTE — Progress Notes (Signed)
Mobility Specialist: Progress Note   07/13/21 1545  Mobility  Activity Ambulated with assistance in hallway  Level of Assistance Contact guard assist, steadying assist  Assistive Device Other (Comment) (IV pole)  Distance Ambulated (ft) 200 ft  Activity Response Tolerated fair  $Mobility charge 1 Mobility   Pre-Mobility: 85 HR, 92% SpO2 During Mobility: 102 HR, 90% SpO2 Post-Mobility: 82 HR, 93% SpO2  Pt received in the bed and agreeable to mobility. Ambulated on RA. Stopped x2 for standing breaks secondary to SOB, otherwise no c/o. Pt back to bed after session with call bell at his side.   [facility] Channin Agustin Mobility Specialist Mobility Specialist [address]: [phone]

## 2021-07-13 NOTE — Progress Notes (Signed)
Patient in no respiratory distress at this time. Will continue to monitor patient and provide bipap as needed.

## 2021-07-14 ENCOUNTER — Other Ambulatory Visit (HOSPITAL_COMMUNITY): Payer: Self-pay

## 2021-07-14 ENCOUNTER — Encounter (HOSPITAL_COMMUNITY): Payer: Self-pay | Admitting: Internal Medicine

## 2021-07-14 ENCOUNTER — Other Ambulatory Visit: Payer: Self-pay

## 2021-07-14 DIAGNOSIS — J441 Chronic obstructive pulmonary disease with (acute) exacerbation: Secondary | ICD-10-CM | POA: Diagnosis not present

## 2021-07-14 DIAGNOSIS — Z87891 Personal history of nicotine dependence: Secondary | ICD-10-CM | POA: Diagnosis not present

## 2021-07-14 LAB — CBC
HCT: 36.5 % — ABNORMAL LOW (ref 39.0–52.0)
Hemoglobin: 11.5 g/dL — ABNORMAL LOW (ref 13.0–17.0)
MCH: 29.9 pg (ref 26.0–34.0)
MCHC: 31.5 g/dL (ref 30.0–36.0)
MCV: 94.8 fL (ref 80.0–100.0)
Platelets: 240 10*3/uL (ref 150–400)
RBC: 3.85 MIL/uL — ABNORMAL LOW (ref 4.22–5.81)
RDW: 13.5 % (ref 11.5–15.5)
WBC: 12.7 10*3/uL — ABNORMAL HIGH (ref 4.0–10.5)
nRBC: 0 % (ref 0.0–0.2)

## 2021-07-14 LAB — GLUCOSE, CAPILLARY
Glucose-Capillary: 92 mg/dL (ref 70–99)
Glucose-Capillary: 170 mg/dL — ABNORMAL HIGH (ref 70–99)

## 2021-07-14 LAB — VITAMIN D 25 HYDROXY (VIT D DEFICIENCY, FRACTURES): Vit D, 25-Hydroxy: 17.18 ng/mL — ABNORMAL LOW (ref 30–100)

## 2021-07-14 LAB — BASIC METABOLIC PANEL
Anion gap: 5 (ref 5–15)
BUN: 19 mg/dL (ref 6–20)
CO2: 28 mmol/L (ref 22–32)
Calcium: 9 mg/dL (ref 8.9–10.3)
Chloride: 107 mmol/L (ref 98–111)
Creatinine, Ser: 0.81 mg/dL (ref 0.61–1.24)
GFR, Estimated: >60 mL/min (ref >60)
Glucose, Bld: 143 mg/dL — ABNORMAL HIGH (ref 70–99)
Potassium: 4.2 mmol/L (ref 3.5–5.1)
Sodium: 140 mmol/L (ref 135–145)

## 2021-07-14 LAB — STREP PNEUMONIAE URINARY ANTIGEN: Strep Pneumo Urinary Antigen: NEGATIVE

## 2021-07-14 MED ORDER — VITAMIN D3 25 MCG PO TABS
ORAL_TABLET | Freq: Every day | ORAL | 2 refills | Status: DC
  Filled 2021-07-14: qty 60, 60d supply, fill #0

## 2021-07-14 MED ORDER — PREDNISONE 10 MG PO TABS
ORAL_TABLET | ORAL | 3 refills | Status: DC
  Filled 2021-07-14: qty 38, 29d supply, fill #0

## 2021-07-14 MED ORDER — AMOXICILLIN-POT CLAVULANATE 875-125 MG PO TABS
1.0000 | ORAL_TABLET | Freq: Two times a day (BID) | ORAL | 0 refills | Status: AC
  Filled 2021-07-14: qty 4, 2d supply, fill #0

## 2021-07-14 MED ORDER — AZITHROMYCIN 500 MG PO TABS
500.0000 mg | ORAL_TABLET | Freq: Every day | ORAL | 0 refills | Status: AC
  Filled 2021-07-14: qty 2, 2d supply, fill #0

## 2021-07-14 NOTE — Discharge Instructions (Signed)
[name],  It was a pleasure taking care of you at [facility] were admitted for shortness of breath and found to have a community acquired pneumonia. You were treated with antibiotics, steroids and inhalers with improvement in your symptoms. We are discharging you home now that you are doing better. Please follow the following instructions.   1) Please call your primary care doctor for a 1-week hospital follow-up appt 2) Take prednisone 40 mg daily for 3 days, then go back to your usual 10 mg daily 3) Take Augmentin twice daily and azithromycin daily for the next 2 days 4) Take vitamin D 1000 mcg capsule daily 4) Continue using your oxygen at home as previously instructed  Take care,  Dr. Linwood Dibbles, MD, MPH

## 2021-07-14 NOTE — Discharge Summary (Addendum)
Name: James [name] MRN: [id number] DOB: 1960-09-21 61 y.o. PCP: [name], MD  Date of Admission: 07/12/2021  7:29 AM Date of Discharge:   07/14/2021 Attending Physician: Dr. Daryll Drown  Discharge Diagnosis: Principal Problem:   COPD with acute exacerbation [facility]) Active Problems:   Hypertension   Chronic respiratory failure with hypoxia ([place])   Type 2 diabetes mellitus without complication, without long-term current use of insulin ([place])   Community acquired pneumonia    Discharge Medications: Allergies as of 07/14/2021   No Known Allergies      Medication List     TAKE these medications    amoxicillin-clavulanate 875-125 MG tablet Commonly known as: AUGMENTIN Take 1 tablet by mouth 2 (two) times daily for 2 days.   azithromycin 500 MG tablet Commonly known as: ZITHROMAX Take 1 tablet (500 mg total) by mouth daily for 2 days. Start taking on: July 15, 2021   clobetasol cream 0.05 % Commonly known as: TEMOVATE Apply 1 Application topically 2 (two) times daily.   furosemide 20 MG tablet Commonly known as: LASIX Take 1 tablet (20 mg total) by mouth daily as needed for edema.   glipiZIDE 5 MG 24 hr tablet Commonly known as: GLUCOTROL XL Take 1 tablet (5 mg total) by mouth daily with breakfast.   guaiFENesin 100 MG/5ML liquid Commonly known as: ROBITUSSIN Take 5 mLs by mouth every 4 (four) hours as needed for cough or to loosen phlegm.   losartan 50 MG tablet Commonly known as: COZAAR Take 1 tablet (50 mg total) by mouth daily.   metoprolol succinate 25 MG 24 hr tablet Commonly known as: TOPROL-XL Take 1 tablet (25 mg total) by mouth daily.   pantoprazole 40 MG tablet Commonly known as: PROTONIX Take 1 tablet (40 mg total) by mouth daily.   predniSONE 10 MG tablet Commonly known as: DELTASONE Take 4 tablets (40 mg total) by mouth daily with breakfast for 3 days, THEN 1 tablet (10 mg total) daily with breakfast. Start taking on: July 15, 2021 What  changed: See the new instructions.   pregabalin 100 MG capsule Commonly known as: Lyrica Take 1 capsule (100 mg total) by mouth 2 (two) times daily.   Trelegy Ellipta 100-62.5-25 MCG/ACT Aepb Generic drug: Fluticasone-Umeclidin-Vilant Inhale 1 puff into the lungs daily.   Ventolin HFA 108 (90 Base) MCG/ACT inhaler Generic drug: albuterol Inhale 2 puffs into the lungs every 4 (four) hours as needed.   albuterol (2.5 MG/3ML) 0.083% nebulizer solution Commonly known as: PROVENTIL Take 3 mLs (2.5 mg total) by nebulization every 6 (six) hours as needed for wheezing or shortness of breath.   Vitamin D (Cholecalciferol) 25 MCG (1000 UT) Caps Take 1 capsule by mouth daily.        Disposition and follow-up:   James [name] was discharged from [place] in Stable condition.  At the hospital follow up visit please address:  1.  Follow-up:  a.  Community-acquired pneumonia: Patient was found to have left lower lobe pneumonia on admission.  He was treated with vancomycin, cefepime and azithromycin. Respiratory status improved to his baseline and he was discharged home with Augmentin and azithromycin to complete a 5-day course of antibiotics. Please reassess his respiratory status and repeat CBC to endure white count is stable.     b.  COPD exacerbation: In the setting of community-acquired pneumonia. He was treated with prednisone 40 mg and DuoNebs. Discharged home on prednisone 40 mg for 3 more days, then  10 mg daily   2.  Labs / imaging needed at time of follow-up: CBC  3.  Pending labs/ test needing follow-up: None  Follow-up Appointments:  Follow-up Information     [name], MD Follow up in 1 week(s).   Specialty: Pulmonary Disease Contact information: [address] [place] [address] [phone]         Geralynn Rile, MD .   Specialties: Cardiology, Internal Medicine, Radiology Contact information: [place] [address] [place] Hospital Course by problem list: James [name] is 61 yo male with a PMH of chronic respiratory failure 2/2 COPD on chronic 2L O2 requirement, T2DM, alcohol use disorder, and HTN presenting to [facility] 7/9 for 1 day of acute dyspnea and admitted for acute on chronic respiratory failure 2/2 COPD exacerbation and CAP.  #Acute on chronic respiratory failure #COPD exacerbation due to community-acquired pneumonia Patient presented with acute dyspnea and his exam/laboratory findings were consistent with COPD exacerbation due to pneumonia in left lower lung fields. On arrival to ED, he was placed on BiPAP but quickly transitioned to Nasal cannula 4 L. Initial VBG did not show acidosis. Lactic acid was initially elevated to 2.2, but normalized to 1.8 with IV fluids. Given concern for multi-drug resistant organisms due to his immunocompromised state and recurrent hospitalizations, broad and atypical bug coverage with cefepime, vancomycin, and azithromycin was started. Strep pneumo antigen was negative and blood cultures had no growth after 2 days. COPD exacerbation was treated with triple therapy nebulizers, steroids and q4 short-acting bronchodilators. He was transitioned to oral amoxicillin/clavulanate and kept on azithromycin at discharge. Respiratory status improved significantly and patient was transition back to his baseline 2 L [place]. He did not have any further O2 need on ambulation with pulse ox. He was discharged home to complete 2 more days of Augmentin and azithromycin for 3 more days of prednisone 40 mg before transition to his home 10 mg daily.    #Atypical chest pain On admission, patient reported a sharp, mid-sternal chest pain that began when he arrived to the hospital. This pain did not appear to have cardiac etiology given EKG without ischemic changes as well as negative initial and follow up troponin. Presentation was consistent with  pleuritic pain due to acute respiratory failure from COPD and pneumonia. Pain resolved by second day of hospitalization.  #Type II diabetes mellitus A1c was found to be 6.4% on admission, nearly unchanged from 6.5% in December. Patient was maintained on SSI during admission and CBGs remained at goal.   #Hypertension The patient was hypertensive on admission to 703-500X systolic, but was normotensive the next morning. Held patient's home losartan and metoprolol given acute illness, but restarted meds the day of discharge because blood pressures became mildly hypertensive again.   #Alcohol use disorder Per chart review, patient has previous history of heavy drinking and reports drinking a six pack each day on the weekend. The patient also has a history of alcohol withdrawal. Hemoglobin was found to be borderline low at 12.3 and decreased to 11.5 with normocytic MCV, which may be related to vitamin deficiencies due to chronic alcohol use. Patient placed on CIWA without ativan on admission and started on thiamine, folate, and multivitamin supplementation. Patient experienced mild agitation during admission, but presentation was not consistent with withdrawal or delirium tremens. He was discharged without any signs of withdrawal. Follow up  outpatient.  #Chronic steroid use Patient has a history of chronic steroid use due to chronic respiratory failure and has had repeated hospitalizations for pneumonia. Given his excess alcohol use in addition to chronic use of glucocorticoids, a pantoprazole 40 mg daily was started for PUD prophylaxis. Vitamin D level was obtained due to osteoporosis risk and was found to be moderately deficient at 17. Patient was started on cholecalciferol supplementation at discharge. Patient was counseled on the benefits of pneumococcal vaccine given his immunocompromised status, recurrent infections, and COPD, but he refused the vaccination.  #Lung nodule Chart review on admission  revealed that patient's CT without contrast on previous admission (06/10/21) showed several new pulmonary nodules. Most severe finding was a 6 mm right solid pulmonary nodule. IR recommend a non-contrast chest CT at 3-6 months out and then another non-contrast chest CT at 18-24 months. Counseled patient and ensured plans for appropriate outpatient follow-up upon discharge.  Subjective Yesterday evening, the patient was able to ambulate on room air while saturating at 90%, though he paused twice for standing breaks 2/2 SOB. No signs of alcohol withdrawal reported overnight. Patient notes that his atypical chest pain has resolved. He denies any SOB and says he is feeling much better. He denies headache, vision changes, increased tremors, sweats, chills, and anxiety. He states that he is not in any pain.  Physical Exam General:  Well-appearing male resting comfortably in bed. Cardiovascular:  RRR. No murmurs/rubs/gallops. Normal S1/S2. Respiratory:  Mild diffuse end-expiratory wheezes. No crackles or rales bilaterally. Normal WOB on 2L O2. Abdominal:  Nontender to palpation. No rebound or guarding. Extremities:  No edema or cyanosis. Normal ROM in all 4 extremities. Skin:  Pink and dry. No rashes or ecchymosis. Psych:  Pleasant and appropriate.   Discharge Vitals:   BP (!) 142/81 (BP Location: Left Wrist)   Pulse 81   Temp 98 F (36.7 C) (Oral)   Resp 20   Ht 5\' 9"  (1.753 m)   Wt 93.7 kg   SpO2 94%   BMI 30.49 kg/m   Pertinent Labs, Studies, and Procedures:     Latest Ref Rng & Units 07/14/2021    3:46 AM 07/13/2021   12:18 AM 07/12/2021    7:55 AM  CBC  WBC 4.0 - 10.5 K/uL 12.7  21.7    Hemoglobin 13.0 - 17.0 g/dL 11.5  12.3  15.0   Hematocrit 39.0 - 52.0 % 36.5  38.4  44.0   Platelets 150 - 400 K/uL 240  278         Latest Ref Rng & Units 07/14/2021    3:46 AM 07/13/2021   12:18 AM 07/12/2021    7:55 AM  CMP  Glucose 70 - 99 mg/dL 143  151    BUN 6 - 20 mg/dL 19  17    Creatinine  0.61 - 1.24 mg/dL 0.81  1.03    Sodium 135 - 145 mmol/L 140  143  141   Potassium 3.5 - 5.1 mmol/L 4.2  4.3  3.9   Chloride 98 - 111 mmol/L 107  102    CO2 22 - 32 mmol/L 28  27    Calcium 8.9 - 10.3 mg/dL 9.0  9.2      DG Chest Port 1 View  Result Date: 07/12/2021 CLINICAL DATA:  Shortness of breath. EXAM: PORTABLE CHEST 1 VIEW COMPARISON:  05/08/2021 FINDINGS: Stable cardiomediastinal contours. No signs of pleural effusion or edema. Asymmetric airspace opacification within the left midlung and left lower  lobe concerning for pneumonia. Numerous bilateral remote appearing rib deformities are identified, likely posttraumatic. This appears similar to the previous examination. IMPRESSION: 1. Left midlung and left lower lobe airspace opacities concerning for pneumonia. 2. Chronic bilateral rib deformities. Electronically Signed   By: Kerby Moors M.D.   On: 07/12/2021 08:53     Discharge Instructions: Discharge Instructions     Call MD for:  difficulty breathing, headache or visual disturbances   Complete by: As directed    Call MD for:  temperature >100.4   Complete by: As directed    Diet - low sodium heart healthy   Complete by: As directed    Increase activity slowly   Complete by: As directed      Mr. Vandevoort,  It was a pleasure taking care of you at [facility] were admitted for shortness of breath and found to have a community acquired pneumonia. You were treated with antibiotics, steroids and inhalers with improvement in your symptoms. We are discharging you home now that you are doing better. Please follow the following instructions.   1) Please call your primary care doctor for a 1-week hospital follow-up appt 2) Take prednisone 40 mg daily for 3 days, then go back to your usual 10 mg daily 3) Take Augmentin twice daily and azithromycin daily for the next 2 days 4) Take vitamin D 1000 mcg capsule daily 4) Continue using your oxygen at home as previously  instructed  Take care,  Dr. Linwood Dibbles, MD, MPH  Signed: [name], MD 07/14/2021, 1:31 PM   Pager: [phone]

## 2021-07-14 NOTE — Social Work (Signed)
CSW acknowledges consult for SU counseling and resources. CSW met at bedside to discuss with pt. Pt states he does occasionally drink a 6 pack at night, but does not feel he has a problem, and has declined resources at this time. Please reconsult TOC for any further needs.

## 2021-07-14 NOTE — Plan of Care (Signed)
DISCHARGE NOTE HOME James [name] to be discharged home  per MD order. Discussed prescriptions and follow up appointments with the patient. Prescriptions given to patient; medication list explained in detail. Patient verbalized understanding.  Skin clean, dry and intact without evidence of skin break down, no evidence of skin tears noted. IV catheter discontinued intact. Site without signs and symptoms of complications. Dressing and pressure applied. Pt denies pain at the site currently. No complaints noted.  Patient free of lines, drains, and wounds.   An After Visit Summary (AVS) was printed and given to the patient. Patient attempting to find a ride home.  Stephan Minister, RN

## 2021-07-14 NOTE — Evaluation (Signed)
Physical Therapy Evaluation Patient Details Name: James [name] MRN: [id number] DOB: January 30, 1960 Today's Date: 07/14/2021  History of Present Illness  Pt is a 61 y/o male admitted secondary to acute dyspnea with respiratory failure secondary to CAP. PMH including but not limited to COPD,  type II diabetes mellitus, alcohol use disorder, hypertension.  Clinical Impression  Pt presented supine in bed with HOB elevated, awake and willing to participate in therapy session. Prior to admission, pt reported that he was independent with all functional mobility and ADLs. Pt lives with a friend in a single level home with two steps to enter. At the time of evaluation, pt was independent with bed mobility, completed transfers with supervision and ambulated in the hallway with min guard for safety. He was on 2L of O2 throughout with SpO2 decreasing to 91% with activity. All VSS throughout. Pt would continue to benefit from skilled physical therapy services at this time while admitted and after d/c to address the below listed limitations in order to improve overall safety and independence with functional mobility.      Recommendations for follow up therapy are one component of a multi-disciplinary discharge planning process, led by the attending physician.  Recommendations may be updated based on patient status, additional functional criteria and insurance authorization.  Follow Up Recommendations Home health PT (pt would benefit from a HHPT safety assessment and potentially ongoing services but declining on eval)      Assistance Recommended at Discharge Intermittent Supervision/Assistance  Patient can return home with the following  Assistance with cooking/housework;Assist for transportation;Help with stairs or ramp for entrance    Equipment Recommendations Other (comment) (pt would benefit from trial with a cane or RW; declining on eval)  Recommendations for Other Services       Functional Status  Assessment Patient has had a recent decline in their functional status and demonstrates the ability to make significant improvements in function in a reasonable and predictable amount of time.     Precautions / Restrictions Precautions Precaution Comments: monitor SpO2 and dyspnea Restrictions Weight Bearing Restrictions: No      Mobility  Bed Mobility Overal bed mobility: Independent                  Transfers Overall transfer level: Needs assistance Equipment used: None Transfers: Sit to/from Stand Sit to Stand: Supervision           General transfer comment: for safety    Ambulation/Gait Ambulation/Gait assistance: Min guard Gait Distance (Feet): 100 Feet Assistive device:  (pushing O2 tank) Gait Pattern/deviations: Step-through pattern, Decreased stride length, Drifts right/left Gait velocity: decreased     General Gait Details: pt with instability and one minor LOB that he was able to self-recover with use of stepping strategy  Stairs            Wheelchair Mobility    Modified Rankin (Stroke Patients Only)       Balance Overall balance assessment: Needs assistance Sitting-balance support: Feet supported Sitting balance-Leahy Scale: Normal     Standing balance support: During functional activity, No upper extremity supported Standing balance-Leahy Scale: Fair                               Pertinent Vitals/Pain Pain Assessment Pain Assessment: No/denies pain    Home Living Family/patient expects to be discharged to:: Private residence Living Arrangements: Non-relatives/Friends Available Help at Discharge: Available 24 hours/day Type of Home:  House Home Access: Stairs to enter Entrance Stairs-Rails: None Technical brewer of Steps: 2   Home Layout: One level Home Equipment: None Additional Comments: On 2 L O2 at home all the time    Prior Function Prior Level of Function : Independent/Modified Independent              Mobility Comments: no longer drives but goes out with help to get groceries       Hand Dominance   Dominant Hand: Right    Extremity/Trunk Assessment   Upper Extremity Assessment Upper Extremity Assessment: Overall WFL for tasks assessed    Lower Extremity Assessment Lower Extremity Assessment: Overall WFL for tasks assessed       Communication   Communication: No difficulties  Cognition Arousal/Alertness: Awake/alert Behavior During Therapy: WFL for tasks assessed/performed Overall Cognitive Status: Within Functional Limits for tasks assessed                                          General Comments      Exercises     Assessment/Plan    PT Assessment Patient needs continued PT services  PT Problem List Cardiopulmonary status limiting activity;Decreased balance;Decreased mobility;Decreased coordination       PT Treatment Interventions DME instruction;Gait training;Stair training;Functional mobility training;Therapeutic activities;Therapeutic exercise;Balance training;Neuromuscular re-education;Patient/family education    PT Goals (Current goals can be found in the Care Plan section)  Acute Rehab PT Goals Patient Stated Goal: "home today" PT Goal Formulation: With patient Time For Goal Achievement: 07/28/21 Potential to Achieve Goals: Good    Frequency Min 3X/week     Co-evaluation               AM-PAC PT "6 Clicks" Mobility  Outcome Measure Help needed turning from your back to your side while in a flat bed without using bedrails?: None Help needed moving from lying on your back to sitting on the side of a flat bed without using bedrails?: None Help needed moving to and from a bed to a chair (including a wheelchair)?: None Help needed standing up from a chair using your arms (e.g., wheelchair or bedside chair)?: None Help needed to walk in hospital room?: A Little Help needed climbing 3-5 steps with a railing? : A  Little 6 Click Score: 22    End of Session Equipment Utilized During Treatment: Oxygen Activity Tolerance: Patient tolerated treatment well Patient left: in chair;with call bell/phone within reach Nurse Communication: Mobility status PT Visit Diagnosis: Other abnormalities of gait and mobility (R26.89)    Time: 7209-4709 PT Time Calculation (min) (ACUTE ONLY): 23 min   Charges:   PT Evaluation $PT Eval Low Complexity: 1 Low PT Treatments $Gait Training: 8-22 mins        Anastasio Champion, DPT  Acute Rehabilitation Services Office [place] 07/14/2021, 11:35 AM

## 2021-07-15 ENCOUNTER — Telehealth: Payer: Self-pay

## 2021-07-15 LAB — LEGIONELLA PNEUMOPHILA SEROGP 1 UR AG: L. pneumophila Serogp 1 Ur Ag: NEGATIVE

## 2021-07-15 NOTE — Telephone Encounter (Signed)
Transition Care Management Follow-up Telephone Call Date of discharge and from where: 07/14/2021, [facility] How have you been since you were released from the hospital? He said he is tired, his breathing is the " same" but he is all right.  Any questions or concerns? No  Items Reviewed: Did the pt receive and understand the discharge instructions provided?  He said he has them but he is not able to read.  His sister, James [name], usually reviews them for him,  Medications obtained and verified? Yes -he said he has all of his medications as well as a working nebulizer.  I reviewed the orders  for the augmentin and zithromax and he correctly explained that he is taking one tablet of each medication twice a day.  He was not clear with the prednisone orders. He said he has the 10 mg tablets and is taking one a day. I explained to him that the instructions are to take 4 tablets (40 mg ) once a day for 3 days and then start taking 1 tablet (10 mg) daily. He said he understood and correctly repeated the order back.  Other? No  Any new allergies since your discharge? No  Dietary orders reviewed? No Do you have support at home?  Lives alone but his sister, James [name], lives close by and he speaks with her regularly.   Home Care and Equipment/Supplies: Were home health services ordered? no If so, what is the name of the agency? N/a  Has the agency set up a time to come to the patient's home? not applicable Were any new equipment or medical supplies ordered?  No What is the name of the medical supply agency? N/a Were you able to get the supplies/equipment? not applicable Do you have any questions related to the use of the equipment or supplies? No  Functional Questionnaire: (I = Independent and D = Dependent) ADLs: independent with personal care, ambulation.  Needs assistance with med management. He said he is using his O2 @ 2L continuously.   Follow up appointments reviewed:  PCP Hospital f/u appt  confirmed? Yes  Scheduled to see [name]- 07/22/2030.  [facility] f/u appt confirmed? Yes  Scheduled to see pulmonary- 7/25/203.   Are transportation arrangements needed? No - he said [name], RN has arranged a ride for him to the appointment with [name] next week.  If their condition worsens, is the pt aware to call PCP or go to the Emergency Dept.? Yes Was the patient provided with contact information for the PCP's office or ED? Yes Was to pt encouraged to call back with questions or concerns? Yes

## 2021-07-16 ENCOUNTER — Other Ambulatory Visit: Payer: Medicaid Other | Admitting: *Deleted

## 2021-07-16 DIAGNOSIS — Z515 Encounter for palliative care: Secondary | ICD-10-CM

## 2021-07-17 LAB — CULTURE, BLOOD (ROUTINE X 2)
Culture: NO GROWTH
Special Requests: ADEQUATE

## 2021-07-17 NOTE — Progress Notes (Signed)
[facility] CARE RN VISIT  PATIENT NAME: James [name] DOB: 1960-01-10 MRN: [id number]  PRIMARY CARE PROVIDER: [name], MD  RESPONSIBLE PARTY: [name] (sister) Acct ID - Guarantor Home Phone Work Phone Relationship Acct Type  [id number] James [name], James [name]* [phone]  Self P/F     [place], [facility] [address], [place] [address]   RN telephonic encounter completed with patient s/p recent hospital discharge on 07/14/21 d/t pneumonia. He says he is feeling a little better. He woke up on Sunday morning and couldn't breathe which scared him, so he went to the hospital. He completed his last oral antibiotic today. His Prednisone was increased to 40 mg for 3 days, then he will resume 10mg  daily. He continues on oxygen at 2L. He still has some sob with exertion and has to take rest periods, but is remains independent w/ADLs. Appetite has been good. He has an appt with [name] for after hospital follow up on 07/21/21 and will be seeing Dr. Valeta Harms with Pulmonology which is a new patient visit on 07/28/21. He has no immediate questions/concerns at this time. Palliative care team will continue to follow.   Daryl Eastern, RN BSN

## 2021-07-21 ENCOUNTER — Other Ambulatory Visit: Payer: Self-pay

## 2021-07-21 ENCOUNTER — Ambulatory Visit: Payer: Medicaid Other | Attending: Critical Care Medicine | Admitting: Critical Care Medicine

## 2021-07-21 ENCOUNTER — Ambulatory Visit
Admission: RE | Admit: 2021-07-21 | Discharge: 2021-07-21 | Disposition: A | Discharge status: Home / Self Care | Payer: Medicaid Other | Source: Ambulatory Visit | Attending: Critical Care Medicine | Admitting: Critical Care Medicine

## 2021-07-21 ENCOUNTER — Encounter: Payer: Self-pay | Admitting: Critical Care Medicine

## 2021-07-21 VITALS — BP 116/79 | HR 76 | Wt 211.2 lb

## 2021-07-21 DIAGNOSIS — L2089 Other atopic dermatitis: Secondary | ICD-10-CM

## 2021-07-21 DIAGNOSIS — B356 Tinea cruris: Secondary | ICD-10-CM | POA: Insufficient documentation

## 2021-07-21 DIAGNOSIS — E119 Type 2 diabetes mellitus without complications: Secondary | ICD-10-CM | POA: Diagnosis not present

## 2021-07-21 DIAGNOSIS — J9611 Chronic respiratory failure with hypoxia: Secondary | ICD-10-CM | POA: Diagnosis not present

## 2021-07-21 DIAGNOSIS — J189 Pneumonia, unspecified organism: Secondary | ICD-10-CM

## 2021-07-21 DIAGNOSIS — I10 Essential (primary) hypertension: Secondary | ICD-10-CM

## 2021-07-21 DIAGNOSIS — G629 Polyneuropathy, unspecified: Secondary | ICD-10-CM | POA: Diagnosis not present

## 2021-07-21 DIAGNOSIS — J441 Chronic obstructive pulmonary disease with (acute) exacerbation: Secondary | ICD-10-CM | POA: Diagnosis not present

## 2021-07-21 DIAGNOSIS — R059 Cough, unspecified: Secondary | ICD-10-CM | POA: Diagnosis not present

## 2021-07-21 DIAGNOSIS — R0602 Shortness of breath: Secondary | ICD-10-CM | POA: Diagnosis not present

## 2021-07-21 MED ORDER — CLOBETASOL PROPIONATE 0.05 % EX CREA
1.0000 | TOPICAL_CREAM | Freq: Two times a day (BID) | CUTANEOUS | 0 refills | Status: DC
Start: 2021-07-21 — End: 2021-09-09
  Filled 2021-07-21: qty 30, 30d supply, fill #0

## 2021-07-21 MED ORDER — CLOTRIMAZOLE-BETAMETHASONE 1-0.05 % EX CREA
1.0000 | TOPICAL_CREAM | Freq: Two times a day (BID) | CUTANEOUS | 0 refills | Status: DC
  Filled 2021-07-21: qty 30, 15d supply, fill #0

## 2021-07-21 MED ORDER — PREDNISONE 10 MG PO TABS
10.0000 mg | ORAL_TABLET | Freq: Every day | ORAL | 3 refills | Status: DC
  Filled 2021-07-21: qty 60, 60d supply, fill #0
  Filled 2021-07-22 – 2021-07-24 (×2): qty 30, 30d supply, fill #0
  Filled 2021-08-31: qty 30, 30d supply, fill #1
  Filled 2021-10-14: qty 30, 30d supply, fill #2

## 2021-07-21 MED ORDER — ALBUTEROL SULFATE HFA 108 (90 BASE) MCG/ACT IN AERS
2.0000 | INHALATION_SPRAY | RESPIRATORY_TRACT | 0 refills | Status: DC | PRN
  Filled 2021-07-21: qty 18, 16d supply, fill #0

## 2021-07-21 MED ORDER — FLUCONAZOLE 150 MG PO TABS
150.0000 mg | ORAL_TABLET | Freq: Every day | ORAL | 0 refills | Status: DC
  Filled 2021-07-21: qty 3, 3d supply, fill #0

## 2021-07-21 NOTE — Assessment & Plan Note (Signed)
A1c at goal at last visit we will monitor

## 2021-07-21 NOTE — Assessment & Plan Note (Signed)
Blood pressure controlled no changes

## 2021-07-21 NOTE — Patient Instructions (Addendum)
Complete screening labs will be obtained, obtain chest x-ray today on the way out  Take fluconazole 1 pill daily for 2 doses this is a yeast medication and then also apply the Lotrisone cream to the groin twice daily for the groin rash  Refills on Temovate for the arm was given  Refill on albuterol given  Return to see [name] 6 weeks

## 2021-07-21 NOTE — Assessment & Plan Note (Signed)
Continue oxygen at night

## 2021-07-21 NOTE — Progress Notes (Signed)
Established Patient Office Visit  Subjective   Patient ID: James [name], male    DOB: [birth year]  Age: 61 y.o. MRN: [id number] Transitional care post Hospital liver Chief Complaint  Patient presents with   Hospitalization Follow-up   Medication Refill    Inhaler     06/29/21 Since the last visit patient's been doing well however he ran out of his losartan for 1 week.  He does complain of an itching rash on both arms.  He is painting the inside of his house he tries to stay from the out-of-doors and where the heat is.  CT of the chest was done and showed pulmonary nodules will need to be repeated in 6 months.  Pneumonia has resolved.  He has been compliant with his inhaled medications.  Breathing appears to be at baseline.  7/18 Yet another readmission since last OV: Patient had developed pneumonia increased cough shortness of breath here today for follow-up  Patient still coughing mucus white in nature some wheezing still.  Below is copy discharge summary  Date of Admission: 07/12/2021  7:29 AM Date of Discharge:   07/14/2021 Attending Physician: Dr. Daryll Drown   Discharge Diagnosis: Principal Problem:   COPD with acute exacerbation [facility]) Active Problems:   Hypertension   Chronic respiratory failure with hypoxia ([place])   Type 2 diabetes mellitus without complication, without long-term current use of insulin ([place])   Community acquired pneumonia   Disposition and follow-up:   [name] was discharged from [facility] in Stable condition.  At the hospital follow up visit please address:   1.  Follow-up:             a.  Community-acquired pneumonia: Patient was found to have left lower lobe pneumonia on admission.  He was treated with vancomycin, cefepime and azithromycin. Respiratory status improved to his baseline and he was discharged home with Augmentin and azithromycin to complete a 5-day course of antibiotics. Please reassess his respiratory status and  repeat CBC to endure white count is stable.                           b.  COPD exacerbation: In the setting of community-acquired pneumonia. He was treated with prednisone 40 mg and DuoNebs. Discharged home on prednisone 40 mg for 3 more days, then 10 mg daily     2.  Labs / imaging needed at time of follow-up: CBC   3.  Pending labs/ test needing follow-up: None Past medical history reviewed no changes made  Review of Systems  Constitutional:  Negative for chills, diaphoresis, fever, malaise/fatigue and weight loss.  HENT:  Negative for congestion, hearing loss, nosebleeds, sore throat and tinnitus.   Eyes:  Negative for blurred vision, photophobia and redness.  Respiratory:  Positive for cough and shortness of breath. Negative for hemoptysis, sputum production, wheezing and stridor.        White  Cardiovascular:  Negative for chest pain, palpitations, orthopnea, claudication, leg swelling and PND.  Gastrointestinal:  Negative for abdominal pain, blood in stool, constipation, diarrhea, heartburn, nausea and vomiting.  Genitourinary:  Negative for dysuria, flank pain, frequency, hematuria and urgency.  Musculoskeletal:  Negative for back pain, falls, joint pain, myalgias and neck pain.  Skin:  Negative for itching and rash.  Neurological:  Positive for tingling. Negative for dizziness, tremors, sensory change, speech change, focal weakness, seizures, loss of consciousness, weakness and headaches.  Endo/Heme/Allergies:  Negative  for environmental allergies and polydipsia. Does not bruise/bleed easily.  Psychiatric/Behavioral:  Negative for depression, memory loss, substance abuse and suicidal ideas. The patient is not nervous/anxious and does not have insomnia.       Objective:     BP 116/79   Pulse 76   Wt 211 lb 3.2 oz (95.8 kg)   SpO2 94%   BMI 31.19 kg/m    Physical Exam Vitals reviewed.  Constitutional:      Appearance: Normal appearance. He is well-developed. He is not  diaphoretic.  HENT:     Head: Normocephalic and atraumatic.     Nose: No nasal deformity, septal deviation, mucosal edema or rhinorrhea.     Right Sinus: No maxillary sinus tenderness or frontal sinus tenderness.     Left Sinus: No maxillary sinus tenderness or frontal sinus tenderness.     Mouth/Throat:     Pharynx: No oropharyngeal exudate.  Eyes:     General: No scleral icterus.    Conjunctiva/sclera: Conjunctivae normal.     Pupils: Pupils are equal, round, and reactive to light.  Neck:     Thyroid: No thyromegaly.     Vascular: No carotid bruit or JVD.     Trachea: Trachea normal. No tracheal tenderness or tracheal deviation.  Cardiovascular:     Rate and Rhythm: Normal rate and regular rhythm.     Chest Wall: PMI is not displaced.     Pulses: Normal pulses. No decreased pulses.     Heart sounds: Normal heart sounds, S1 normal and S2 normal. Heart sounds not distant. No murmur heard.    No systolic murmur is present.     No diastolic murmur is present.     No friction rub. No gallop. No S3 or S4 sounds.  Pulmonary:     Effort: Pulmonary effort is normal. No tachypnea, accessory muscle usage or respiratory distress.     Breath sounds: No stridor. Wheezing present. No decreased breath sounds, rhonchi or rales.     Comments: Distant breath sounds Chest:     Chest wall: No tenderness.  Abdominal:     General: Bowel sounds are normal. There is no distension.     Palpations: Abdomen is soft. Abdomen is not rigid.     Tenderness: There is no abdominal tenderness. There is no guarding or rebound.  Musculoskeletal:        General: Normal range of motion.     Cervical back: Normal range of motion and neck supple. No edema, erythema or rigidity. No muscular tenderness. Normal range of motion.  Lymphadenopathy:     Head:     Right side of head: No submental or submandibular adenopathy.     Left side of head: No submental or submandibular adenopathy.     Cervical: No cervical  adenopathy.  Skin:    General: Skin is warm and dry.     Coloration: Skin is not pale.     Findings: Rash present.     Nails: There is no clubbing.     Comments: Erythematous rash both forearms is improved  Groin rash compatible with tinea cruris  Neurological:     Mental Status: He is alert and oriented to person, place, and time.     Sensory: No sensory deficit.  Psychiatric:        Speech: Speech normal.        Behavior: Behavior normal.      No results found for any visits on 07/21/21.  The ASCVD Risk score (Arnett DK, et al., 2019) failed to calculate for the following reasons:   Cannot find a previous HDL lab   Cannot find a previous total cholesterol lab    Assessment & Plan:   Problem List Items Addressed This Visit       Cardiovascular and Mediastinum   Hypertension (Chronic)    Blood pressure controlled no changes        Respiratory   Chronic respiratory failure with hypoxia (HCC)    Continue oxygen at night      COPD with acute exacerbation ([place])    Due to community-acquired pneumonia has finished a course of antibiotics we will check chest x-ray continue inhaled medications continue prednisone low-dose      Relevant Medications   predniSONE (DELTASONE) 10 MG tablet   albuterol (VENTOLIN HFA) 108 (90 Base) MCG/ACT inhaler   Community acquired pneumonia - Primary    Reassess chest x-ray      Relevant Medications   fluconazole (DIFLUCAN) 150 MG tablet   albuterol (VENTOLIN HFA) 108 (90 Base) MCG/ACT inhaler   Other Relevant Orders   CBC with Differential/Platelet   DG Chest 2 View     Endocrine   Type 2 diabetes mellitus without complication, without long-term current use of insulin (HCC)    A1c at goal at last visit we will monitor      Relevant Orders   Comprehensive metabolic panel     Nervous and Auditory   Neuropathy    Continue Lyrica        Musculoskeletal and Integument   Other atopic dermatitis    Continue Temovate to  arms      Tinea cruris    Begin Lotrisone cream to groin      Relevant Medications   fluconazole (DIFLUCAN) 150 MG tablet   clotrimazole-betamethasone (LOTRISONE) cream  Return in about 6 weeks (around 09/01/2021).    Asencion Noble, MD

## 2021-07-21 NOTE — Assessment & Plan Note (Signed)
Continue Temovate to arms

## 2021-07-21 NOTE — Assessment & Plan Note (Signed)
Continue Lyrica. 

## 2021-07-21 NOTE — Assessment & Plan Note (Signed)
Due to community-acquired pneumonia has finished a course of antibiotics we will check chest x-ray continue inhaled medications continue prednisone low-dose

## 2021-07-21 NOTE — Assessment & Plan Note (Signed)
Begin Lotrisone cream to groin

## 2021-07-21 NOTE — Assessment & Plan Note (Signed)
Reassess chest x-ray

## 2021-07-22 ENCOUNTER — Other Ambulatory Visit: Payer: Self-pay

## 2021-07-22 ENCOUNTER — Telehealth: Payer: Self-pay

## 2021-07-22 ENCOUNTER — Other Ambulatory Visit: Payer: Self-pay | Admitting: Critical Care Medicine

## 2021-07-22 LAB — COMPREHENSIVE METABOLIC PANEL
ALT: 21 IU/L (ref 0–44)
AST: 18 IU/L (ref 0–40)
Albumin/Globulin Ratio: 2.2 (ref 1.2–2.2)
Albumin: 4.1 g/dL (ref 3.8–4.9)
Alkaline Phosphatase: 60 IU/L (ref 44–121)
BUN/Creatinine Ratio: 17 (ref 10–24)
BUN: 15 mg/dL (ref 8–27)
Bilirubin Total: 0.2 mg/dL (ref 0.0–1.2)
CO2: 28 mmol/L (ref 20–29)
Calcium: 9.5 mg/dL (ref 8.6–10.2)
Chloride: 102 mmol/L (ref 96–106)
Creatinine, Ser: 0.88 mg/dL (ref 0.76–1.27)
Globulin, Total: 1.9 g/dL (ref 1.5–4.5)
Glucose: 124 mg/dL — ABNORMAL HIGH (ref 70–99)
Potassium: 5 mmol/L (ref 3.5–5.2)
Sodium: 144 mmol/L (ref 134–144)
Total Protein: 6 g/dL (ref 6.0–8.5)
eGFR: 98 mL/min/{1.73_m2} (ref >59)

## 2021-07-22 LAB — CBC WITH DIFFERENTIAL/PLATELET
Basophils Absolute: 0.1 10*3/uL (ref 0.0–0.2)
Basos: 1 %
EOS (ABSOLUTE): 0.1 10*3/uL (ref 0.0–0.4)
Eos: 1 %
Hematocrit: 38.8 % (ref 37.5–51.0)
Hemoglobin: 12.8 g/dL — ABNORMAL LOW (ref 13.0–17.7)
Immature Grans (Abs): 0 10*3/uL (ref 0.0–0.1)
Immature Granulocytes: 0 %
Lymphocytes Absolute: 1.5 10*3/uL (ref 0.7–3.1)
Lymphs: 11 %
MCH: 29.6 pg (ref 26.6–33.0)
MCHC: 33 g/dL (ref 31.5–35.7)
MCV: 90 fL (ref 79–97)
Monocytes Absolute: 0.6 10*3/uL (ref 0.1–0.9)
Monocytes: 5 %
Neutrophils Absolute: 10.6 10*3/uL — ABNORMAL HIGH (ref 1.4–7.0)
Neutrophils: 82 %
Platelets: 388 10*3/uL (ref 150–450)
RBC: 4.32 x10E6/uL (ref 4.14–5.80)
RDW: 12.3 % (ref 11.6–15.4)
WBC: 12.9 10*3/uL — ABNORMAL HIGH (ref 3.4–10.8)

## 2021-07-22 MED ORDER — LEVOFLOXACIN 750 MG PO TABS
750.0000 mg | ORAL_TABLET | Freq: Every day | ORAL | 0 refills | Status: AC
  Filled 2021-07-22: qty 5, 5d supply, fill #0

## 2021-07-22 NOTE — Telephone Encounter (Signed)
-----   Message from [name], MD sent at 07/22/2021  5:30 AM EDT ----- Let pt know CXR is better pneumonia resolving recommend no heavy physical activities including painting until he is better

## 2021-07-22 NOTE — Telephone Encounter (Signed)
Pt was called and is aware of results, DOB was confirmed.  ?

## 2021-07-22 NOTE — Telephone Encounter (Signed)
-----   Message from [name], MD sent at 07/22/2021  5:41 AM EDT ----- Let pt know white count still elevated and CXR still abnormal so I plan to send an Antibiotic to our pharmacy, all other labs were normal

## 2021-07-22 NOTE — Progress Notes (Signed)
Let pt know CXR is better pneumonia resolving recommend no heavy physical activities including painting until he is better

## 2021-07-22 NOTE — Telephone Encounter (Signed)
Called pt to let him know of his refills for prednisone

## 2021-07-22 NOTE — Progress Notes (Signed)
Let pt know white count still elevated and CXR still abnormal so I plan to send an Antibiotic to our pharmacy, all other labs were normal

## 2021-07-24 ENCOUNTER — Other Ambulatory Visit: Payer: Self-pay

## 2021-07-28 ENCOUNTER — Ambulatory Visit: Payer: Medicaid Other | Admitting: Pulmonary Disease

## 2021-07-28 ENCOUNTER — Encounter: Payer: Self-pay | Admitting: Pulmonary Disease

## 2021-07-28 VITALS — BP 126/68 | HR 99 | Temp 98.6°F | Ht 69.0 in | Wt 211.4 lb

## 2021-07-28 DIAGNOSIS — R918 Other nonspecific abnormal finding of lung field: Secondary | ICD-10-CM | POA: Diagnosis not present

## 2021-07-28 DIAGNOSIS — Z5111 Encounter for antineoplastic chemotherapy: Secondary | ICD-10-CM | POA: Diagnosis not present

## 2021-07-28 DIAGNOSIS — J432 Centrilobular emphysema: Secondary | ICD-10-CM | POA: Diagnosis not present

## 2021-07-28 DIAGNOSIS — C3491 Malignant neoplasm of unspecified part of right bronchus or lung: Secondary | ICD-10-CM | POA: Diagnosis not present

## 2021-07-28 DIAGNOSIS — Z5112 Encounter for antineoplastic immunotherapy: Secondary | ICD-10-CM

## 2021-07-28 MED ORDER — BREZTRI AEROSPHERE 160-9-4.8 MCG/ACT IN AERO: 2.0000 | INHALATION_SPRAY | Freq: Two times a day (BID) | RESPIRATORY_TRACT | 0 refills | Status: DC

## 2021-07-28 NOTE — Patient Instructions (Signed)
Thank you for visiting Dr. Valeta Harms at [facility]. Today we recommend the following:.  Orders Placed This Encounter  Procedures   CT Chest Wo Contrast   Pulmonary Function Test   Breztri Samples, new prescription today  CT Chest in 6 months and PFTs prior to office appt.   Return in about 6 months (around 01/28/2022) for with Eric Form, NP, or Dr. Valeta Harms.    Please do your part to reduce the spread of COVID-19.

## 2021-07-28 NOTE — H&P (Deleted)
Subjective:   PATIENT ID: James [name] GENDER: male DOB: Oct 19, 1960, MRN: [id number]  Chief Complaint  Patient presents with   New Patient (Initial Visit)    Pt is a new pt for BI. CT of Chest last month, multiple lung nodules. Pt states he has SOB all the time. Pt is on Trelegy daily and albuterol as needed. Pt states the inhalers work well for him.     James [name] is a 61 y.o. M with pertinent PMH of COPD on Trelegy and albuterol with several exacerbations over the last year, HTN, T2DM, atopic dermatitis who presents today as a new patient referral for lung nodules noted on imaging during recent hospitalization for CAP.   [name] describes a smoking history of 2 PPD for 20 years starting at age 24. He quit smoking in 2014 after an accident at work. 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 His breathing at this time is the same as it has been for several years and he has grown accustomed to pursed-lip breathing. He has had several hospitalizations over the last year for pneumonia which he states did not provide him much relief overall. He states that he is doing well with Trelegy daily but using his PRN albuterol inhalor up to 4 times per day. Breathing is worse in hot weather but not positional. He can walk up and down a hall before having to stop to catch his breath. He denies wheezing. He endorses increased abdominal girth but no other signs over edema or overload.  He reports that his father did have cancer though he does not know which kind.  Past Medical History:  Diagnosis Date   Acute on chronic respiratory failure with hypoxia ([place]) 06/08/2013   CAP (community acquired pneumonia) 05/17/2018   See admit 05/17/18 ? rml  with   covid pcr neg - rx augmentin > f/u cxr in 4-6 weeks is fine unless condition declines    Community acquired pneumonia of right lower lobe of lung 05/17/2018   See admit 05/17/18 ? 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   Recurrent pneumonia 02/20/2021   Tick bite 06/03/2018     Family History  Problem Relation Age of Onset   Heart disease Father    COPD Sister      Past Surgical History:  Procedure Laterality Date   LUNG REMOVAL, PARTIAL     VIDEO ASSISTED THORACOSCOPY (VATS)/THOROCOTOMY Left 06/11/2013   Procedure: VIDEO ASSISTED THORACOSCOPY (VATS)/THOROCOTOMY;  Surgeon: Ivin Poot, MD;  Location: [facility];  Service: Thoracic;  Laterality: Left;   VIDEO BRONCHOSCOPY N/A 06/11/2013   Procedure: VIDEO BRONCHOSCOPY;  Surgeon: Ivin Poot, MD;  Location: [facility];  Service: Thoracic;  Laterality: N/A;   VIDEO BRONCHOSCOPY N/A 08/10/2018   Procedure: VIDEO BRONCHOSCOPY WITH FLUORO;  Surgeon: Candee Furbish, MD;  Location: [facility] ENDOSCOPY;  Service: Endoscopy;  Laterality: N/A;    Social History   Socioeconomic History   Marital  status: Divorced    Spouse name: Not on file   Number of children: Not on file   Years of education: Not on file   Highest education level: Not on file  Occupational History   Occupation: Dealer   Occupation: Painter  Tobacco Use   Smoking status: Former    Packs/day: 1.50    Years: 40.00    Total pack years: 60.00    Types: Cigarettes    Quit date: 2014    Years since quitting: 9.5   Smokeless tobacco: Former    Types: Nurse, children's Use: Never used  Substance and Sexual Activity   Alcohol use: Not Currently    Alcohol/week: 28.0 standard drinks of alcohol    Types: 28 Cans of beer per week    Comment: 4  beers a night previously   Drug use: Not Currently    Types: Marijuana    Comment: daily; quit using crack and methamphetamine about 5 months ago    Sexual activity: Not Currently    Partners: Female  Other Topics Concern   Not on file  Social History Narrative   Lives alone near [place] Determinants of Health   Financial Resource Strain: [place]  (12/25/2020)   Overall Financial Resource Strain (CARDIA)    Difficulty of Paying Living Expenses: Not very hard  Recent Concern: Financial Resource Strain - High Risk (10/29/2020)   Overall Financial Resource Strain (CARDIA)    Difficulty of Paying Living Expenses: Hard  Food Insecurity: Food Insecurity Present (03/16/2021)   Hunger Vital Sign    Worried About Running Out of Food in the Last Year: Sometimes true    Ran Out of Food in the Last Year: Sometimes true  Transportation Needs: Unmet Transportation Needs (07/08/2021)   PRAPARE - Transportation    Lack of Transportation (Medical): Yes    Lack of Transportation (Non-Medical): Yes  Physical Activity: Inactive (12/11/2020)   Exercise Vital Sign    Days of Exercise per Week: 0 days    Minutes of Exercise per Session: 0 min  Stress: No Stress Concern Present (12/25/2020)   [place] of Stress : Not at all  Social Connections: Moderately Integrated (11/13/2020)   Social Connection and Isolation Panel [NHANES]    Frequency of Communication with Friends and Family: More than three times a week    Frequency of Social Gatherings with Friends and Family: Once a week    Attends Religious Services: 1 to 4 times per year    Active Member of [employer] or Organizations: Yes    Attends Archivist Meetings: 1 to 4 times per year    Marital Status: Divorced  Human resources officer Violence: Not on file     No Known Allergies   Outpatient Medications Prior to Visit  Medication Sig Dispense Refill    albuterol (PROVENTIL) (2.5 MG/3ML) 0.083% nebulizer solution Take 3 mLs (2.5 mg total) by nebulization every 6 (six) hours as needed for wheezing or shortness of breath. 270 mL 1   albuterol (VENTOLIN HFA) 108 (90 Base) MCG/ACT inhaler Inhale 2 puffs into the lungs every 4 (four) hours as needed. 18 g 0   clobetasol cream (TEMOVATE) 9.79 % Apply 1 Application topically 2 (two) times daily. 30 g 0   clotrimazole-betamethasone (LOTRISONE) cream Apply 1 Application topically 2 (two) times daily. To groin affected areas 30 g 0   fluconazole (DIFLUCAN)  150 MG tablet Take 1 tablet (150 mg total) by mouth daily. 3 tablet 0   Fluticasone-Umeclidin-Vilant (TRELEGY ELLIPTA) 100-62.5-25 MCG/ACT AEPB Inhale 1 puff into the lungs daily. 60 each 2   furosemide (LASIX) 20 MG tablet Take 1 tablet (20 mg total) by mouth daily as needed for edema. 30 tablet 2   glipiZIDE (GLUCOTROL XL) 5 MG 24 hr tablet Take 1 tablet (5 mg total) by mouth daily with breakfast. 60 tablet 1   guaiFENesin (ROBITUSSIN) 100 MG/5ML liquid Take 5 mLs by mouth every 4 (four) hours as needed for cough or to loosen phlegm. 120 mL 0   losartan (COZAAR) 50 MG tablet Take 1 tablet (50 mg total) by mouth daily. 90 tablet 1   metoprolol succinate (TOPROL-XL) 25 MG 24 hr tablet Take 1 tablet (25 mg total) by mouth daily. 90 tablet 3   pantoprazole (PROTONIX) 40 MG tablet Take 1 tablet (40 mg total) by mouth daily. 60 tablet 6   predniSONE (DELTASONE) 10 MG tablet Take 1 tablet (10 mg total) by mouth daily with breakfast. 60 tablet 3   pregabalin (LYRICA) 100 MG capsule Take 1 capsule (100 mg total) by mouth 2 (two) times daily. 60 capsule 2   Vitamin D3 (VITAMIN D) 25 MCG tablet Take 1 tablet by mouth daily. 60 tablet 2   No facility-administered medications prior to visit.    Review of Systems  Constitutional:  Negative for chills, fever and weight loss.  Respiratory:  Positive for shortness of breath. Negative for wheezing.   Cardiovascular:   Negative for orthopnea, leg swelling and PND.  Gastrointestinal:  Negative for constipation and diarrhea.  Genitourinary:  Positive for frequency.  Skin:  Positive for itching.  Endo/Heme/Allergies:  Negative for environmental allergies.     Objective:  Constitutional:Appears older than stated age. No acute distress. Cardio:Regular rate and rhythm. Pulm:Decreased breath sounds on L. No wheezing or crackles appreciated. Uses pursed-lips for breathing, can complete full sentences and ambulate without acute respiratory distress. Abdomen:Soft, nontender, somewhat distended. NGE:[id number] for extremity edema. Clubbing appreciated on bilateral hands. Skin:Warm and dry with eczema over LUE. Neuro:No focal deficit noted.  Psych:Pleasant mood and affect.   Vitals:   07/28/21 0848  BP: 126/68  Pulse: 99  Temp: 98.6 F (37 C)  TempSrc: Oral  SpO2: 94%  Weight: 211 lb 6.4 oz (95.9 kg)  Height: 5\' 9"  (1.753 m)   94% on  RA BMI Readings from Last 3 Encounters:  07/28/21 31.22 kg/m  07/21/21 31.19 kg/m  07/14/21 30.49 kg/m   Wt Readings from Last 3 Encounters:  07/28/21 211 lb 6.4 oz (95.9 kg)  07/21/21 211 lb 3.2 oz (95.8 kg)  07/14/21 206 lb 8 oz (93.7 kg)     CBC    Component Value Date/Time   WBC 12.9 (H) 07/21/2021 1454   WBC 12.7 (H) 07/14/2021 0346   RBC 4.32 07/21/2021 1454   RBC 3.85 (L) 07/14/2021 0346   HGB 12.8 (L) 07/21/2021 1454   HCT 38.8 07/21/2021 1454   PLT 388 07/21/2021 1454   MCV 90 07/21/2021 1454   MCH 29.6 07/21/2021 1454   MCH 29.9 07/14/2021 0346   MCHC 33.0 07/21/2021 1454   MCHC 31.5 07/14/2021 0346   RDW 12.3 07/21/2021 1454   LYMPHSABS 1.5 07/21/2021 1454   MONOABS 1.2 (H) 07/12/2021 0742   EOSABS 0.1 07/21/2021 1454   BASOSABS 0.1 07/21/2021 1454    Chest Imaging: CT Chest 06/2021: Several new pulmonary nodules with largest  at 6 mm on R.  Pulmonary Functions Testing Results:     No data to display             Assessment &  Plan:     ICD-10-CM   1. Lung nodules  R91.8 CT Chest Wo Contrast    Pulmonary Function Test      Discussion: Patient here due to new imaging finding of lung nodules. He has a high risk for lung cancer given extensive smoking history. He does not have allergies or notable occupational exposures throughout his life. He has had several hospitalizations for pneumonia after which he denied feeling any better from a respiratory standpoint. He is also using his albuterol inhaler with increased frequency to get minimal relief.  -Adjust outpatient medication: continue albuterol inhaler PRN. D/c Trelegy and trial Breztri. Patient has been counseled to take only Breztri and to not also use Trelegy at the same time. If he finds better relief with the samples provided today we will continue him on Breztri -Obtain formal PFTs -Repeat CT chest in December to monitor lung nodules   Current Outpatient Medications:    albuterol (PROVENTIL) (2.5 MG/3ML) 0.083% nebulizer solution, Take 3 mLs (2.5 mg total) by nebulization every 6 (six) hours as needed for wheezing or shortness of breath., Disp: 270 mL, Rfl: 1   albuterol (VENTOLIN HFA) 108 (90 Base) MCG/ACT inhaler, Inhale 2 puffs into the lungs every 4 (four) hours as needed., Disp: 18 g, Rfl: 0   clobetasol cream (TEMOVATE) 2.77 %, Apply 1 Application topically 2 (two) times daily., Disp: 30 g, Rfl: 0   clotrimazole-betamethasone (LOTRISONE) cream, Apply 1 Application topically 2 (two) times daily. To groin affected areas, Disp: 30 g, Rfl: 0   fluconazole (DIFLUCAN) 150 MG tablet, Take 1 tablet (150 mg total) by mouth daily., Disp: 3 tablet, Rfl: 0   Fluticasone-Umeclidin-Vilant (TRELEGY ELLIPTA) 100-62.5-25 MCG/ACT AEPB, Inhale 1 puff into the lungs daily., Disp: 60 each, Rfl: 2   furosemide (LASIX) 20 MG tablet, Take 1 tablet (20 mg total) by mouth daily as needed for edema., Disp: 30 tablet, Rfl: 2   glipiZIDE (GLUCOTROL XL) 5 MG 24 hr tablet, Take 1 tablet  (5 mg total) by mouth daily with breakfast., Disp: 60 tablet, Rfl: 1   guaiFENesin (ROBITUSSIN) 100 MG/5ML liquid, Take 5 mLs by mouth every 4 (four) hours as needed for cough or to loosen phlegm., Disp: 120 mL, Rfl: 0   losartan (COZAAR) 50 MG tablet, Take 1 tablet (50 mg total) by mouth daily., Disp: 90 tablet, Rfl: 1   metoprolol succinate (TOPROL-XL) 25 MG 24 hr tablet, Take 1 tablet (25 mg total) by mouth daily., Disp: 90 tablet, Rfl: 3   pantoprazole (PROTONIX) 40 MG tablet, Take 1 tablet (40 mg total) by mouth daily., Disp: 60 tablet, Rfl: 6   predniSONE (DELTASONE) 10 MG tablet, Take 1 tablet (10 mg total) by mouth daily with breakfast., Disp: 60 tablet, Rfl: 3   pregabalin (LYRICA) 100 MG capsule, Take 1 capsule (100 mg total) by mouth 2 (two) times daily., Disp: 60 capsule, Rfl: 2   Vitamin D3 (VITAMIN D) 25 MCG tablet, Take 1 tablet by mouth daily., Disp: 60 tablet, Rfl: 2  I spent 30 minutes dedicated to the care of this patient on the date of this encounter to include pre-visit review of records, face-to-face time with the patient discussing conditions above, post visit ordering of testing, clinical documentation with the electronic health record, making appropriate referrals as documented,  and communicating necessary findings to members of the patients care team.   Farrel Gordon, DO [facility] 07/28/2021 9:17 AM

## 2021-07-28 NOTE — Progress Notes (Signed)
Subjective:   PATIENT ID: James [name] GENDER: male DOB: 02-28-1960, MRN: [id number]  Chief Complaint  Patient presents with   New Patient (Initial Visit)    Pt is a new pt for BI. CT of Chest last month, multiple lung nodules. Pt states he has SOB all the time. Pt is on Trelegy daily and albuterol as needed. Pt states the inhalers work well for him.     Elridge D. Palladino is a 61 y.o. M with pertinent PMH of COPD on Trelegy and albuterol with several exacerbations over the last year, HTN, T2DM, atopic dermatitis who presents today as a new patient referral for lung nodules noted on imaging during recent hospitalization for CAP.   Mr. Lalone describes a smoking history of 2 PPD for 20 years starting at age 27. He quit smoking in 2014 after an accident at work. 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 His breathing at this time is the same as it has been for several years and he has grown accustomed to pursed-lip breathing. He has had several hospitalizations over the last year for pneumonia which he states did not provide him much relief overall. He states that he is doing well with Trelegy daily but using his PRN albuterol inhalor up to 4 times per day. Breathing is worse in hot weather but not positional. He can walk up and down a hall before having to stop to catch his breath. He denies wheezing. He endorses increased abdominal girth but no other signs over edema or overload.  He reports that his father did have cancer though he does not know which kind.  Past Medical History:  Diagnosis Date   Acute on chronic respiratory failure with hypoxia ([place]) 06/08/2013   CAP (community acquired pneumonia) 05/17/2018   See admit 05/17/18 ? rml  with   covid pcr neg - rx augmentin > f/u cxr in 4-6 weeks is fine unless condition declines    Community acquired pneumonia of right lower lobe of lung 05/17/2018   See admit 05/17/18 ? 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   Recurrent pneumonia 02/20/2021   Tick bite 06/03/2018     Family History  Problem Relation Age of Onset   Heart disease Father    COPD Sister      Past Surgical History:  Procedure Laterality Date   LUNG REMOVAL, PARTIAL     VIDEO ASSISTED THORACOSCOPY (VATS)/THOROCOTOMY Left 06/11/2013   Procedure: VIDEO ASSISTED THORACOSCOPY (VATS)/THOROCOTOMY;  Surgeon: Ivin Poot, MD;  Location: [facility];  Service: Thoracic;  Laterality: Left;   VIDEO BRONCHOSCOPY N/A 06/11/2013   Procedure: VIDEO BRONCHOSCOPY;  Surgeon: Ivin Poot, MD;  Location: [facility];  Service: Thoracic;  Laterality: N/A;   VIDEO BRONCHOSCOPY N/A 08/10/2018   Procedure: VIDEO BRONCHOSCOPY WITH FLUORO;  Surgeon: Candee Furbish, MD;  Location: [facility] ENDOSCOPY;  Service: Endoscopy;  Laterality: N/A;    Social History   Socioeconomic History   Marital  status: Divorced    Spouse name: Not on file   Number of children: Not on file   Years of education: Not on file   Highest education level: Not on file  Occupational History   Occupation: Dealer   Occupation: Painter  Tobacco Use   Smoking status: Former    Packs/day: 1.50    Years: 40.00    Total pack years: 60.00    Types: Cigarettes    Quit date: 2014    Years since quitting: 9.5   Smokeless tobacco: Former    Types: Nurse, children's Use: Never used  Substance and Sexual Activity   Alcohol use: Not Currently    Alcohol/week: 28.0 standard drinks of alcohol    Types: 28 Cans of beer per week    Comment: 4  beers a night previously   Drug use: Not Currently    Types: Marijuana    Comment: daily; quit using crack and methamphetamine about 5 months ago    Sexual activity: Not Currently    Partners: Female  Other Topics Concern   Not on file  Social History Narrative   L[place] alone near [place] Determinants of Health   Financial Resource Strain: [place]  (12/25/2020)   Overall Financial Resource Strain (CARDIA)    Difficulty of Paying Living Expenses: Not very hard  Recent Concern: Financial Resource Strain - High Risk (10/29/2020)   Overall Financial Resource Strain (CARDIA)    Difficulty of Paying Living Expenses: Hard  Food Insecurity: Food Insecurity Present (03/16/2021)   Hunger Vital Sign    Worried About Running Out of Food in the Last Year: Sometimes true    Ran Out of Food in the Last Year: Sometimes true  Transportation Needs: Unmet Transportation Needs (07/08/2021)   PRAPARE - Transportation    Lack of Transportation (Medical): Yes    Lack of Transportation (Non-Medical): Yes  Physical Activity: Inactive (12/11/2020)   Exercise Vital Sign    Days of Exercise per Week: 0 days    Minutes of Exercise per Session: 0 min  Stress: No Stress Concern Present (12/25/2020)   [place] of Stress : Not at all  Social Connections: Moderately Integrated (11/13/2020)   Social Connection and Isolation Panel [NHANES]    Frequency of Communication with Friends and Family: More than three times a week    Frequency of Social Gatherings with Friends and Family: Once a week    Attends Religious Services: 1 to 4 times per year    Active Member of [employer] or Organizations: Yes    Attends Archivist Meetings: 1 to 4 times per year    Marital Status: Divorced  Human resources officer Violence: Not on file     No Known Allergies   Outpatient Medications Prior to Visit  Medication Sig Dispense Refill    albuterol (PROVENTIL) (2.5 MG/3ML) 0.083% nebulizer solution Take 3 mLs (2.5 mg total) by nebulization every 6 (six) hours as needed for wheezing or shortness of breath. 270 mL 1   albuterol (VENTOLIN HFA) 108 (90 Base) MCG/ACT inhaler Inhale 2 puffs into the lungs every 4 (four) hours as needed. 18 g 0   clobetasol cream (TEMOVATE) 3.78 % Apply 1 Application topically 2 (two) times daily. 30 g 0   clotrimazole-betamethasone (LOTRISONE) cream Apply 1 Application topically 2 (two) times daily. To groin affected areas 30 g 0   fluconazole (DIFLUCAN)  150 MG tablet Take 1 tablet (150 mg total) by mouth daily. 3 tablet 0   Fluticasone-Umeclidin-Vilant (TRELEGY ELLIPTA) 100-62.5-25 MCG/ACT AEPB Inhale 1 puff into the lungs daily. 60 each 2   furosemide (LASIX) 20 MG tablet Take 1 tablet (20 mg total) by mouth daily as needed for edema. 30 tablet 2   glipiZIDE (GLUCOTROL XL) 5 MG 24 hr tablet Take 1 tablet (5 mg total) by mouth daily with breakfast. 60 tablet 1   guaiFENesin (ROBITUSSIN) 100 MG/5ML liquid Take 5 mLs by mouth every 4 (four) hours as needed for cough or to loosen phlegm. 120 mL 0   losartan (COZAAR) 50 MG tablet Take 1 tablet (50 mg total) by mouth daily. 90 tablet 1   metoprolol succinate (TOPROL-XL) 25 MG 24 hr tablet Take 1 tablet (25 mg total) by mouth daily. 90 tablet 3   pantoprazole (PROTONIX) 40 MG tablet Take 1 tablet (40 mg total) by mouth daily. 60 tablet 6   predniSONE (DELTASONE) 10 MG tablet Take 1 tablet (10 mg total) by mouth daily with breakfast. 60 tablet 3   pregabalin (LYRICA) 100 MG capsule Take 1 capsule (100 mg total) by mouth 2 (two) times daily. 60 capsule 2   Vitamin D3 (VITAMIN D) 25 MCG tablet Take 1 tablet by mouth daily. 60 tablet 2   No facility-administered medications prior to visit.    Review of Systems  Constitutional:  Negative for chills, fever and weight loss.  Respiratory:  Positive for shortness of breath. Negative for wheezing.   Cardiovascular:   Negative for orthopnea, leg swelling and PND.  Gastrointestinal:  Negative for constipation and diarrhea.  Genitourinary:  Positive for frequency.  Skin:  Positive for itching.  Endo/Heme/Allergies:  Negative for environmental allergies.     Objective:  Constitutional:Appears older than stated age. No acute distress. Cardio:Regular rate and rhythm. Pulm:Decreased breath sounds on L. No wheezing or crackles appreciated. Uses pursed-lips for breathing, can complete full sentences and ambulate without acute respiratory distress. Abdomen:Soft, nontender, somewhat distended. EGB:TDVVOHYW for extremity edema. Clubbing appreciated on bilateral hands. Skin:Warm and dry with eczema over LUE. Neuro:No focal deficit noted.  Psych:Pleasant mood and affect.   Vitals:   07/28/21 0848  BP: 126/68  Pulse: 99  Temp: 98.6 F (37 C)  TempSrc: Oral  SpO2: 94%  Weight: 211 lb 6.4 oz (95.9 kg)  Height: 5\' 9"  (1.753 m)   94% on  RA BMI Readings from Last 3 Encounters:  07/28/21 31.22 kg/m  07/21/21 31.19 kg/m  07/14/21 30.49 kg/m   Wt Readings from Last 3 Encounters:  07/28/21 211 lb 6.4 oz (95.9 kg)  07/21/21 211 lb 3.2 oz (95.8 kg)  07/14/21 206 lb 8 oz (93.7 kg)     CBC    Component Value Date/Time   WBC 12.9 (H) 07/21/2021 1454   WBC 12.7 (H) 07/14/2021 0346   RBC 4.32 07/21/2021 1454   RBC 3.85 (L) 07/14/2021 0346   HGB 12.8 (L) 07/21/2021 1454   HCT 38.8 07/21/2021 1454   PLT 388 07/21/2021 1454   MCV 90 07/21/2021 1454   MCH 29.6 07/21/2021 1454   MCH 29.9 07/14/2021 0346   MCHC 33.0 07/21/2021 1454   MCHC 31.5 07/14/2021 0346   RDW 12.3 07/21/2021 1454   LYMPHSABS 1.5 07/21/2021 1454   MONOABS 1.2 (H) 07/12/2021 0742   EOSABS 0.1 07/21/2021 1454   BASOSABS 0.1 07/21/2021 1454    Chest Imaging: CT Chest 06/2021: Several new pulmonary nodules with largest  at 6 mm on R.  Pulmonary Functions Testing Results:     No data to display              Assessment  & Plan:     ICD-10-CM   1. Lung nodules  R91.8 CT Chest Wo Contrast    Pulmonary Function Test      Discussion: Patient here due to new imaging finding of lung nodules. He has a high risk for lung cancer given extensive smoking history. He does not have allergies or notable occupational exposures throughout his life. He has had several hospitalizations for pneumonia after which he denied feeling any better from a respiratory standpoint. He is also using his albuterol inhaler with increased frequency to get minimal relief.  -Adjust outpatient medication: continue albuterol inhaler PRN. D/c Trelegy and trial Breztri. Patient has been counseled to take only Breztri and to not also use Trelegy at the same time. If he finds better relief with the samples provided today we will continue him on Breztri -Obtain formal PFTs -Repeat CT chest in December to monitor lung nodules   Current Outpatient Medications:    albuterol (PROVENTIL) (2.5 MG/3ML) 0.083% nebulizer solution, Take 3 mLs (2.5 mg total) by nebulization every 6 (six) hours as needed for wheezing or shortness of breath., Disp: 270 mL, Rfl: 1   albuterol (VENTOLIN HFA) 108 (90 Base) MCG/ACT inhaler, Inhale 2 puffs into the lungs every 4 (four) hours as needed., Disp: 18 g, Rfl: 0   Budeson-Glycopyrrol-Formoterol (BREZTRI AEROSPHERE) 160-9-4.8 MCG/ACT AERO, Inhale 2 puffs into the lungs in the morning and at bedtime., Disp: 2 each, Rfl: 0   clobetasol cream (TEMOVATE) 3.32 %, Apply 1 Application topically 2 (two) times daily., Disp: 30 g, Rfl: 0   clotrimazole-betamethasone (LOTRISONE) cream, Apply 1 Application topically 2 (two) times daily. To groin affected areas, Disp: 30 g, Rfl: 0   fluconazole (DIFLUCAN) 150 MG tablet, Take 1 tablet (150 mg total) by mouth daily., Disp: 3 tablet, Rfl: 0   Fluticasone-Umeclidin-Vilant (TRELEGY ELLIPTA) 100-62.5-25 MCG/ACT AEPB, Inhale 1 puff into the lungs daily., Disp: 60 each, Rfl: 2   furosemide (LASIX)  20 MG tablet, Take 1 tablet (20 mg total) by mouth daily as needed for edema., Disp: 30 tablet, Rfl: 2   glipiZIDE (GLUCOTROL XL) 5 MG 24 hr tablet, Take 1 tablet (5 mg total) by mouth daily with breakfast., Disp: 60 tablet, Rfl: 1   guaiFENesin (ROBITUSSIN) 100 MG/5ML liquid, Take 5 mLs by mouth every 4 (four) hours as needed for cough or to loosen phlegm., Disp: 120 mL, Rfl: 0   losartan (COZAAR) 50 MG tablet, Take 1 tablet (50 mg total) by mouth daily., Disp: 90 tablet, Rfl: 1   metoprolol succinate (TOPROL-XL) 25 MG 24 hr tablet, Take 1 tablet (25 mg total) by mouth daily., Disp: 90 tablet, Rfl: 3   pantoprazole (PROTONIX) 40 MG tablet, Take 1 tablet (40 mg total) by mouth daily., Disp: 60 tablet, Rfl: 6   predniSONE (DELTASONE) 10 MG tablet, Take 1 tablet (10 mg total) by mouth daily with breakfast., Disp: 60 tablet, Rfl: 3   pregabalin (LYRICA) 100 MG capsule, Take 1 capsule (100 mg total) by mouth 2 (two) times daily., Disp: 60 capsule, Rfl: 2   Vitamin D3 (VITAMIN D) 25 MCG tablet, Take 1 tablet by mouth daily., Disp: 60 tablet, Rfl: 2  I spent 30 minutes dedicated to the care of this patient on the date of this encounter to include pre-visit review of  records, face-to-face time with the patient discussing conditions above, post visit ordering of testing, clinical documentation with the electronic health record, making appropriate referrals as documented, and communicating necessary findings to members of the patients care team.   Farrel Gordon, DO [facility] 07/28/2021 9:47 AM

## 2021-07-28 NOTE — Progress Notes (Signed)
PCCM attending:  S: 61 year old gentleman longstanding history of tobacco use.  Followed care with [name].  Referred for abnormal CT chest with multiple pulmonary nodules.  He also has evidence of centrilobular emphysema no prior PFTs.  From respiratory standpoint he is able to complete most of his activities of daily living.  He does feel short of breath with exertion.  Present today with pursed lip breathing on discussion.  He has had at least 4-5 hospitalizations for COPD exacerbation/pneumonia in the past year.  Currently managed with Trelegy daily and as needed albuterol.  Using his albuterol 3-4 times a day.  O: BP 126/68 (BP Location: Left Arm, Patient Position: Sitting, Cuff Size: Normal)   Pulse 99   Temp 98.6 F (37 C) (Oral)   Ht 5\' 9"  (1.753 m)   Wt 211 lb 6.4 oz (95.9 kg)   SpO2 94%   BMI 31.22 kg/m   General: Obese male resting comfortably in chair, pursed lip breathing HEENT: Tracking appropriately, NCAT Heart: Regular rhythm S1-S2 Lungs: Diminished breath sounds bilaterally, faint expiratory wheeze in the right upper lobe Abdomen: Nontender nondistended  Labs: No significant eosinophils on previous blood work.  Imaging: 06/10/2021 CT chest: Several subcentimeter pulmonary nodules.  Largest is 6 mm on the right. The patient's images have been independently reviewed by me.    Assessment: Multiple pulmonary nodules Former smoker quit 2014, 60-pack-year history CT radiographic evidence of upper lobe predominant emphysema.  Plan: Full PFTs Repeat CT imaging in 6 months. We will try swapping from a dry powder inhaler to MDI. We will give him samples of Breztri today as well as new prescription. If he continues to have exacerbations we could always consider azithromycin Monday Wednesday Friday 250 mg if he has recurrent symptoms.  Agree with documentation from Farrel Gordon, DO PGY 2. I spent 62 minutes dedicated to the care of this patient on the date of this encounter  to include pre-visit review of records, face-to-face time with the patient discussing conditions above, post visit ordering of testing, clinical documentation with the electronic health record, making appropriate referrals as documented, and communicating necessary findings to members of the patients care team.    Garner Nash, [facility] 07/28/2021 8:19 AM

## 2021-08-06 ENCOUNTER — Other Ambulatory Visit: Payer: Self-pay

## 2021-08-06 ENCOUNTER — Other Ambulatory Visit: Payer: Self-pay | Admitting: *Deleted

## 2021-08-06 DIAGNOSIS — J9621 Acute and chronic respiratory failure with hypoxia: Secondary | ICD-10-CM | POA: Diagnosis not present

## 2021-08-06 DIAGNOSIS — J9601 Acute respiratory failure with hypoxia: Secondary | ICD-10-CM | POA: Diagnosis not present

## 2021-08-06 DIAGNOSIS — F79 Unspecified intellectual disabilities: Secondary | ICD-10-CM | POA: Diagnosis not present

## 2021-08-06 NOTE — Patient Instructions (Signed)
Visit Information  James [name] was given information about Medicaid Managed Care team care coordination services as a part of their Healthy [facility] Medicaid benefit. James [name] verbally consented to engagement with the [facility] Managed Care team.   If you are experiencing a medical emergency, please call 911 or report to your local emergency department or urgent care.   If you have a non-emergency medical problem during routine business hours, please contact your provider's office and ask to speak with a nurse.   For questions related to your Healthy [facility] health plan, please call: [phone] or visit the homepage here: [email or web address]  If you would like to schedule transportation through your Healthy [facility] plan, please call the following number at least 2 days in advance of your appointment: [phone]  For information about your ride after you set it up, call Ride Assist at [phone]. Use this number to activate a Will Call pickup, or if your transportation is late for a scheduled pickup. Use this number, too, if you need to make a change or cancel a previously scheduled reservation.  If you need transportation services right away, call [phone]. The after-hours call center is staffed 24 hours to handle ride assistance and urgent reservation requests (including discharges) 365 days a year. Urgent trips include sick visits, hospital discharge requests and life-sustaining treatment.  Call the [facility] at [phone], at any time, 24 hours a day, 7 days a week. If you are in danger or need immediate medical attention call 911.  If you would like help to quit smoking, call 1-800-QUIT-NOW (478) 077-5559) OR Espaol: 1-855-Djelo-Ya (1-884-166-0630) o para ms informacin haga clic aqu or Text READY to 200-400 to register via text  James [name],   Please see education materials related to COPD provided as print  materials.   The patient verbalized understanding of instructions, educational materials, and care plan provided today and agreed to receive a mailed copy of patient instructions, educational materials, and care plan.   Telephone follow up appointment with Managed Medicaid care management team member scheduled for:09/09/21 @ 10:30am  James [name], BSN [place] [name] Care Coordinator   Following is a copy of your plan of care:  Care Plan : [name] Care Manager Plan of Care  Updates made by James Montane, [name] since 08/06/2021 12:00 AM     Problem: Chronic Disease Management needs in patient with COPD   Priority: High     Long-Range Goal: Plan of care established for for Chronic Disease management for patient with COPD   Start Date: 11/13/2020  Expected End Date: 10/05/2021  Priority: High  Note:   Current Barriers:  Care Coordination needs related to Limited access to food  Chronic Disease Management support and education needs related to COPD James [name] recently seen by Pulmonology. Medication change from trelegy to [facility] inhaler. Patient using both, did not realize he was to stop trelegy. Reports James [name] is working great, but only using once daily instead of prescribed twice daily. He is needing refills on pregabalin and prefers delivery due to transportation issues.  RNCM Clinical Goal(s):  Patient will verbalize understanding of plan for management of COPD as evidenced by taking all medications as prescribed, attending all scheduled appointments and contacting provider with questions and concerns take all medications exactly as prescribed and will call provider for medication related questions as evidenced by refilling medications and taking as prescribed    attend all scheduled medical appointments: 7/18 with  PCP and 7/25 for Pulmonary consult as evidenced by provider notes in EMR        demonstrate improved adherence to prescribed treatment plan for COPD  as evidenced by taking all medications as prescribed, attending all scheduled appointments and contacting provider with questions and concerns continue to work with [name] Care Manager and/or Social Worker to address care management and care coordination needs related to COPD as evidenced by adherence to CM Team Scheduled appointments     through collaboration with [name] Care manager, provider, and care team.   Interventions: Inter-disciplinary care team collaboration (see longitudinal plan of care) Evaluation of current treatment plan related to  self management and patient's adherence to plan as established by provider Collaborated with [facility] for needed medications(pregabalin and vit D3), requested delivery to patient   SDOH Barriers (Status:  Goal on track:  Yes. Patient interviewed and SDOH assessment performed        SDOH Interventions    Flowsheet Row Most Recent Value  SDOH Interventions   Transportation Interventions Other (Comment)  [Assisted with arranging transportation with [facility] medical transporation and having medications delivered]       Patient interviewed and appropriate assessments performed Discussed plans with patient for ongoing care management follow up and provided patient with direct contact information for care management team-   COPD: (Status: Goal on Track (progressing): YES.) Long Term Goal  Reviewed medications with patient, including use of prescribed maintenance and rescue inhalers, and provided instruction on medication management and the importance of adherence Reviewed Pulmonology note with Mr. [name], advised patient to stop trelegy and use Breztri twice daily as prescribed Provided instruction about proper use of medications used for management of COPD including inhalers Advised patient to self assesses COPD action plan zone and make appointment with provider if in the yellow zone for 48 hours without improvement Discussed the  importance of adequate rest and management of fatigue with COPD Assessed social determinant of health barriers Provided patient with COPD action zone education Advised patient to limit time outside with increased pollen and rising temperatures Advised patient to contact [facility] [phone] to request a scale Encouraged patient to weigh himself daily and contact provider with weight change of 2 pounds over night or 5 pounds in 5 days   Patient Goals/Self-Care Activities: Take medications as prescribed   Attend all scheduled provider appointments Call pharmacy for medication refills 3-7 days in advance of running out of medications Perform all self care activities independently  Call provider office for new concerns or questions  - avoid second hand smoke - limit outdoor activity during cold weather - keep follow-up appointments:  10/29/21 with [name] - eat healthy/prescribed diet: DASH - get at least 7 to 8 hours of sleep at night

## 2021-08-06 NOTE — Patient Outreach (Signed)
Medicaid Managed Care   Nurse Care Manager Note  08/06/2021 Name:  James [name] MRN:  [id number] DOB:  [birth year]  James [name] is an 61 y.o. year old male who is [name] primary patient of James Gaskins Burnett Harry, MD.  The [facility] Managed Care Coordination team was consulted for assistance with:    COPD  James [name] was given information about Medicaid Managed Care Coordination team services today. James [name] Patient agreed to services and verbal consent obtained.  Engaged with patient by telephone for follow up visit in response to provider referral for case management and/or care coordination services.   Assessments/Interventions:  Review of past medical history, allergies, medications, health status, including review of consultants reports, laboratory and other test data, was performed as part of comprehensive evaluation and provision of chronic care management services.  SDOH (Social Determinants of Health) assessments and interventions performed:   Care Plan  No Known Allergies  Medications Reviewed Today     Reviewed by James Montane, RN (Registered Nurse) on 08/06/21 at 1120  Med List Status: <None>   Medication Order Taking? Sig Documenting Provider Last Dose Status Informant  albuterol (PROVENTIL) (2.5 MG/3ML) 0.083% nebulizer solution [id number] Yes Take 3 mLs (2.5 mg total) by nebulization every 6 (six) hours as needed for wheezing or shortness of breath. James Stain, MD Taking Active Self, Pharmacy Records  albuterol (VENTOLIN HFA) 108 [phone] Base) MCG/ACT inhaler [id number] Yes Inhale 2 puffs into the lungs every 4 (four) hours as needed. James Stain, MD Taking Active   Budeson-Glycopyrrol-Formoterol [facility] AEROSPHERE) 160-9-4.8 MCG/ACT James [name] [id number] Yes Inhale 2 puffs into the lungs in the morning and at bedtime. James Nash, DO Taking Active            Med Note (James [name]   Thu Aug 06, 2021 10:44 AM) Only taking in the morning  clobetasol cream  (TEMOVATE) 0.05 % [id number] Yes Apply 1 Application topically 2 (two) times daily. James Stain, MD Taking Active   clotrimazole-betamethasone Donalynn Furlong) cream [id number] Yes Apply 1 Application topically 2 (two) times daily. To groin affected areas James Stain, MD Taking Active   fluconazole (DIFLUCAN) 150 MG tablet [id number] No Take 1 tablet (150 mg total) by mouth daily.  Patient not taking: Reported on 08/06/2021   James Stain, MD Not Taking Active   Fluticasone-Umeclidin-[facility] ELLIPTA) 100-62.5-25 MCG/ACT AEPB [id number] Yes Inhale 1 puff into the lungs daily. James Stain, MD Taking Active Self, Pharmacy Records  furosemide (LASIX) 20 MG tablet [id number] Yes Take 1 tablet (20 mg total) by mouth daily as needed for edema. James Stain, MD Taking Active Self, Pharmacy Records  glipiZIDE (GLUCOTROL XL) 5 MG 24 hr tablet [id number] Yes Take 1 tablet (5 mg total) by mouth daily with breakfast. James Stain, MD Taking Active Self, Pharmacy Records  guaiFENesin [facility]) 100 MG/5ML liquid [id number] No Take 5 mLs by mouth every 4 (four) hours as needed for cough or to loosen phlegm.  Patient not taking: Reported on 08/06/2021   James Axon, MD Not Taking Active Self, Pharmacy Records           Med Note (James [name], James [name]   Mon Jul 13, 2021  9:56 AM)    losartan (COZAAR) 50 MG tablet [id number] Yes Take 1 tablet (50 mg total) by mouth daily. James Stain, MD Taking Active Self, Pharmacy Records  metoprolol succinate (TOPROL-XL) 25 MG 24 hr tablet [id number] Yes Take  1 tablet (25 mg total) by mouth daily. James Stain, MD Taking Active Self, Pharmacy Records  pantoprazole (PROTONIX) 40 MG tablet [id number] Yes Take 1 tablet (40 mg total) by mouth daily. James Stain, MD Taking Active Self, Pharmacy Records  predniSONE (DELTASONE) 10 MG tablet [id number] Yes Take 1 tablet (10 mg total) by mouth daily with breakfast. James Stain, MD Taking  Active   pregabalin (LYRICA) 100 MG capsule [id number] Yes Take 1 capsule (100 mg total) by mouth 2 (two) times daily. James Stain, MD Taking Active Self, Pharmacy Records  Vitamin D3 (VITAMIN D) 25 MCG tablet [id number] No Take 1 tablet by mouth daily.  Patient not taking: Reported on 08/06/2021   James Axon, MD Not Taking Active             Patient Active Problem List   Diagnosis Date Noted   Tinea cruris 07/21/2021   COPD with acute exacerbation ([place]) 07/12/2021   Community acquired pneumonia 07/12/2021   Multiple lung nodules on CT 06/11/2021   COPD with chronic bronchitis ([place]) 02/01/2021   GERD (gastroesophageal reflux disease) 02/01/2021   Neuropathy 09/29/2020   Bronchiectasis ([place]) 09/24/2019   Type 2 diabetes mellitus without complication, without long-term current use of insulin ([place]) 07/24/2019   Other atopic dermatitis 05/01/2019   Chronic respiratory failure with hypoxia ([place]) 04/23/2019   Intellectual disability 04/23/2019   History of alcohol use 11/23/2018   Hypertension 05/17/2016   History of tobacco abuse 04/06/2016   COPD mixed type ([place]) 03/26/2016    Conditions to be addressed/monitored per PCP order:  COPD  Care Plan : RN Care Manager Plan of Care  Updates made by James Montane, RN since 08/06/2021 12:00 AM     Problem: Chronic Disease Management needs in patient with COPD   Priority: High     Long-Range Goal: Plan of care established for for Chronic Disease management for patient with COPD   Start Date: 11/13/2020  Expected End Date: 10/05/2021  Priority: High  Note:   Current Barriers:  Care Coordination needs related to Limited access to food  Chronic Disease Management support and education needs related to COPD Mr. Rounds recently seen by Pulmonology. Medication change from trelegy to [facility] inhaler. Patient using both, did not realize he was to stop trelegy. Reports James [name] is working great, but only using once daily instead  of prescribed twice daily. He is needing refills on pregabalin and prefers delivery due to transportation issues.  RNCM Clinical Goal(s):  Patient will verbalize understanding of plan for management of COPD as evidenced by taking all medications as prescribed, attending all scheduled appointments and contacting provider with questions and concerns take all medications exactly as prescribed and will call provider for medication related questions as evidenced by refilling medications and taking as prescribed    attend all scheduled medical appointments: 7/18 with PCP and 7/25 for Pulmonary consult as evidenced by provider notes in EMR        demonstrate improved adherence to prescribed treatment plan for COPD as evidenced by taking all medications as prescribed, attending all scheduled appointments and contacting provider with questions and concerns continue to work with RN Care Manager and/or Social Worker to address care management and care coordination needs related to COPD as evidenced by adherence to CM Team Scheduled appointments     through collaboration with RN Care manager, provider, and care team.   Interventions: Inter-disciplinary care team collaboration (see longitudinal plan of care) Evaluation of  current treatment plan related to  self management and patient's adherence to plan as established by provider Collaborated with [facility] for needed medications(pregabalin and vit D3), requested delivery to patient   SDOH Barriers (Status:  Goal on track:  Yes. Patient interviewed and SDOH assessment performed        SDOH Interventions    Flowsheet Row Most Recent Value  SDOH Interventions   Transportation Interventions Other (Comment)  [Assisted with arranging transportation with [employer] medical transporation and having medications delivered]       Patient interviewed and appropriate assessments performed Discussed plans with patient for ongoing care management  follow up and provided patient with direct contact information for care management team-   COPD: (Status: Goal on Track (progressing): YES.) Long Term Goal  Reviewed medications with patient, including use of prescribed maintenance and rescue inhalers, and provided instruction on medication management and the importance of adherence Reviewed Pulmonology note with Mr. Yniguez, advised patient to stop trelegy and use Breztri twice daily as prescribed Provided instruction about proper use of medications used for management of COPD including inhalers Advised patient to self assesses COPD action plan zone and make appointment with provider if in the yellow zone for 48 hours without improvement Discussed the importance of adequate rest and management of fatigue with COPD Assessed social determinant of health barriers Provided patient with COPD action zone education Advised patient to limit time outside with increased pollen and rising temperatures Advised patient to contact [employer] [phone] to request [name] scale Encouraged patient to weigh himself daily and contact provider with weight change of 2 pounds over night or 5 pounds in 5 days   Patient Goals/Self-Care Activities: Take medications as prescribed   Attend all scheduled provider appointments Call pharmacy for medication refills 3-7 days in advance of running out of medications Perform all self care activities independently  Call provider office for new concerns or questions  - avoid second hand smoke - limit outdoor activity during cold weather - keep follow-up appointments:  10/29/21 with [name] - eat healthy/prescribed diet: DASH - get at least 7 to 8 hours of sleep at night       Follow Up:  Patient agrees to Care Plan and Follow-up.  Plan: The Managed Medicaid care management team will reach out to the patient again over the next 30 days.  Date/time of next scheduled RN care management/care coordination outreach:   09/09/21 @ 10:45am   [name] RN, BSN [place]  [employer] manager

## 2021-08-07 ENCOUNTER — Other Ambulatory Visit: Payer: Self-pay

## 2021-08-10 ENCOUNTER — Other Ambulatory Visit: Payer: Self-pay | Admitting: Critical Care Medicine

## 2021-08-10 ENCOUNTER — Other Ambulatory Visit: Payer: Self-pay

## 2021-08-11 ENCOUNTER — Other Ambulatory Visit: Payer: Self-pay

## 2021-08-11 MED ORDER — FUROSEMIDE 20 MG PO TABS
20.0000 mg | ORAL_TABLET | Freq: Every day | ORAL | 2 refills | Status: DC | PRN
  Filled 2021-08-11 – 2021-08-31 (×2): qty 30, 30d supply, fill #0
  Filled 2021-10-14: qty 30, 30d supply, fill #1
  Filled 2021-11-24: qty 30, 30d supply, fill #2

## 2021-08-18 ENCOUNTER — Other Ambulatory Visit: Payer: Self-pay

## 2021-08-31 ENCOUNTER — Other Ambulatory Visit: Payer: Self-pay

## 2021-09-01 ENCOUNTER — Other Ambulatory Visit: Payer: Self-pay

## 2021-09-04 DIAGNOSIS — J9601 Acute respiratory failure with hypoxia: Secondary | ICD-10-CM | POA: Diagnosis not present

## 2021-09-04 DIAGNOSIS — F79 Unspecified intellectual disabilities: Secondary | ICD-10-CM | POA: Diagnosis not present

## 2021-09-04 DIAGNOSIS — J9621 Acute and chronic respiratory failure with hypoxia: Secondary | ICD-10-CM | POA: Diagnosis not present

## 2021-09-06 DIAGNOSIS — J9621 Acute and chronic respiratory failure with hypoxia: Secondary | ICD-10-CM | POA: Diagnosis not present

## 2021-09-06 DIAGNOSIS — J9601 Acute respiratory failure with hypoxia: Secondary | ICD-10-CM | POA: Diagnosis not present

## 2021-09-06 DIAGNOSIS — F79 Unspecified intellectual disabilities: Secondary | ICD-10-CM | POA: Diagnosis not present

## 2021-09-09 ENCOUNTER — Other Ambulatory Visit: Payer: Self-pay

## 2021-09-09 ENCOUNTER — Other Ambulatory Visit: Payer: Self-pay | Admitting: Critical Care Medicine

## 2021-09-09 ENCOUNTER — Other Ambulatory Visit: Payer: Self-pay | Admitting: *Deleted

## 2021-09-09 MED ORDER — ALBUTEROL SULFATE HFA 108 (90 BASE) MCG/ACT IN AERS
2.0000 | INHALATION_SPRAY | RESPIRATORY_TRACT | 1 refills | Status: DC | PRN
  Filled 2021-09-09: qty 18, 17d supply, fill #0

## 2021-09-09 MED ORDER — CLOTRIMAZOLE-BETAMETHASONE 1-0.05 % EX CREA
1.0000 | TOPICAL_CREAM | Freq: Two times a day (BID) | CUTANEOUS | 0 refills | Status: DC
  Filled 2021-09-09 (×2): qty 45, 23d supply, fill #0

## 2021-09-09 MED ORDER — CLOBETASOL PROPIONATE 0.05 % EX CREA
1.0000 | TOPICAL_CREAM | Freq: Two times a day (BID) | CUTANEOUS | 1 refills | Status: DC
  Filled 2021-09-09: qty 30, 30d supply, fill #0

## 2021-09-09 NOTE — Patient Outreach (Signed)
Medicaid Managed Care   Nurse Care Manager Note  09/09/2021 Name:  James [name] MRN:  [id number] DOB:  September 29, 1960  James [name] is an 61 y.o. year old male who is a primary patient of James Gaskins Burnett Harry, MD.  The [facility] Managed Care Coordination team was consulted for assistance with:    COPD  [name] was given information about Medicaid Managed Care Coordination team services today. James [name] Patient agreed to services and verbal consent obtained.  Engaged with patient by telephone for follow up visit in response to provider referral for case management and/or care coordination services.   Assessments/Interventions:  Review of past medical history, allergies, medications, health status, including review of consultants reports, laboratory and other test data, was performed as part of comprehensive evaluation and provision of chronic care management services.  SDOH (Social Determinants of Health) assessments and interventions performed: SDOH Interventions    Flowsheet Row Patient Outreach Telephone from 09/09/2021 in James [name] Patient Outreach Telephone from 07/08/2021 in James [name] Patient Outreach Telephone from 05/15/2021 in James [name] Patient Outreach Telephone from 04/15/2021 in James [name] Patient Outreach Telephone from 03/16/2021 in James [name] Patient Outreach Telephone from 03/02/2021 in James [name] Interventions        Food Insecurity Interventions -- -- -- -- Intervention Not Indicated --  Housing Interventions Intervention Not Indicated -- Intervention Not Indicated -- -- --  Transportation Interventions -- Other (Comment)  [Assisted with arranging transportation with [employer] medical transporation and having medications  delivered] -- Other (Comment)  [Assisted with arranging transportation via [place] -- Other (Comment)  [Assisted patient with arranging transportation to PCP on 03/10/21, via [employer]  Alcohol Usage Interventions Intervention Not Indicated (Score <7) -- -- -- -- --       Care Plan  No Known Allergies  Medications Reviewed Today     Reviewed by James Montane, RN (Registered Nurse) on 09/09/21 at 1017  Med List Status: <None>   Medication Order Taking? Sig Documenting Provider Last Dose Status Informant  albuterol (PROVENTIL) (2.5 MG/3ML) 0.083% nebulizer solution [id number]  Take 3 mLs (2.5 mg total) by nebulization every 6 (six) hours as needed for wheezing or shortness of breath. James Stain, MD  Active Self, Pharmacy Records  albuterol (VENTOLIN HFA) 108 [phone] Base) MCG/ACT inhaler [id number]  Inhale 2 puffs into the lungs every 4 (four) hours as needed. James Stain, MD  Active   Budeson-Glycopyrrol-Formoterol (BREZTRI AEROSPHERE) 160-9-4.8 MCG/ACT James [name] [id number]  Inhale 2 puffs into the lungs in the morning and at bedtime. James Nash, DO  Active            Med Note (James [name] A   Thu Aug 06, 2021 10:44 AM) Only taking in the morning  clobetasol cream (TEMOVATE) 0.05 % [id number]  Apply 1 Application topically 2 (two) times daily. James Stain, MD  Active   clotrimazole-betamethasone Donalynn Furlong) cream [id number]  Apply 1 Application topically 2 (two) times daily. To groin affected areas James Stain, MD  Active   fluconazole (DIFLUCAN) 150 MG tablet [id number]  Take 1 tablet (150 mg total) by mouth daily.  Patient not taking: Reported on 08/06/2021   James Stain, MD  Active   Fluticasone-Umeclidin-[facility] ELLIPTA) 100-62.5-25 MCG/ACT AEPB [id number]  Inhale 1 puff into the lungs daily.  James Stain, MD  Active Self, Pharmacy Records  furosemide (LASIX) 20 MG tablet [id number]  Take 1 tablet (20 mg total) by mouth daily as  needed for edema. James Stain, MD  Active   glipiZIDE (GLUCOTROL XL) 5 MG 24 hr tablet [id number]  Take 1 tablet (5 mg total) by mouth daily with breakfast. James Stain, MD  Active Self, Pharmacy Records  guaiFENesin [facility]) 100 MG/5ML liquid [id number]  Take 5 mLs by mouth every 4 (four) hours as needed for cough or to loosen phlegm.  Patient not taking: Reported on 08/06/2021   [name], MD  Active Self, Pharmacy Records           Med Note (COFFELL, Dionne Bucy   Mon Jul 13, 2021  9:56 AM)    losartan (COZAAR) 50 MG tablet [id number]  Take 1 tablet (50 mg total) by mouth daily. James Stain, MD  Active Self, Pharmacy Records  metoprolol succinate (TOPROL-XL) 25 MG 24 hr tablet [id number]  Take 1 tablet (25 mg total) by mouth daily. James Stain, MD  Active Self, Pharmacy Records  pantoprazole (PROTONIX) 40 MG tablet [id number]  Take 1 tablet (40 mg total) by mouth daily. James Stain, MD  Active Self, Pharmacy Records  predniSONE (DELTASONE) 10 MG tablet [id number]  Take 1 tablet (10 mg total) by mouth daily with breakfast. James Stain, MD  Active   pregabalin (LYRICA) 100 MG capsule [id number]  Take 1 capsule (100 mg total) by mouth 2 (two) times daily. James Stain, MD  Active Self, Pharmacy Records  Vitamin D3 (VITAMIN D) 25 MCG tablet [id number]  Take 1 tablet by mouth daily.  Patient not taking: Reported on 08/06/2021   [name], MD  Active             Patient Active Problem List   Diagnosis Date Noted   Tinea cruris 07/21/2021   COPD with acute exacerbation ([place]) 07/12/2021   Community acquired pneumonia 07/12/2021   Multiple lung nodules on CT 06/11/2021   COPD with chronic bronchitis ([place]) 02/01/2021   GERD (gastroesophageal reflux disease) 02/01/2021   Neuropathy 09/29/2020   Bronchiectasis ([place]) 09/24/2019   Type 2 diabetes mellitus without complication, without long-term current use of insulin ([place]) 07/24/2019   Other  atopic dermatitis 05/01/2019   Chronic respiratory failure with hypoxia ([place]) 04/23/2019   Intellectual disability 04/23/2019   History of alcohol use 11/23/2018   Hypertension 05/17/2016   History of tobacco abuse 04/06/2016   COPD mixed type ([place]) 03/26/2016    Conditions to be addressed/monitored per PCP order:  COPD  Care Plan : RN Care Manager Plan of Care  Updates made by James Montane, RN since 09/09/2021 12:00 AM     Problem: Chronic Disease Management needs in patient with COPD   Priority: High     Long-Range Goal: Plan of care established for for Chronic Disease management for patient with COPD   Start Date: 11/13/2020  Expected End Date: 11/03/2021  Priority: High  Note:   Current Barriers:  Care Coordination needs related to Limited access to food  Chronic Disease Management support and education needs related to COPD Mr. Rountree reports using [name] once daily and it is working great. The heat makes his breathing worse. He is eating good and has a good appetite. He admits to drinking one beer occasionally.  He is needing refills on a few of his medications and prefers delivery due to transportation  issues. He will need assistance arranging transportation to his next PCP appointment.  RNCM Clinical Goal(s):  Patient will verbalize understanding of plan for management of COPD as evidenced by taking all medications as prescribed, attending all scheduled appointments and contacting provider with questions and concerns take all medications exactly as prescribed and will call provider for medication related questions as evidenced by refilling medications and taking as prescribed    attend all scheduled medical appointments: 10/29/21 with PCP as evidenced by provider notes in EMR        demonstrate improved adherence to prescribed treatment plan for COPD as evidenced by taking all medications as prescribed, attending all scheduled appointments and contacting provider with  questions and concerns continue to work with RN Care Manager and/or Social Worker to address care management and care coordination needs related to COPD as evidenced by adherence to CM Team Scheduled appointments     through collaboration with Consulting civil engineer, provider, and care team.   Interventions: Inter-disciplinary care team collaboration (see longitudinal plan of care) Evaluation of current treatment plan related to  self management and patient's adherence to plan as established by provider Reviewed medications, including refill history Collaborated with [facility] for needed medications, requested delivery to patient   SDOH Barriers (Status:  Goal on track:  Yes. Patient interviewed and SDOH assessment performed-RNCM will assist with arranging transportation to PCP appointment on 10/29/21 during next outreach        SDOH Interventions    Flowsheet Row Most Recent Value  SDOH Interventions   Transportation Interventions Other (Comment)  [Assisted with arranging transportation with [employer] medical transporation and having medications delivered]       Patient interviewed and appropriate assessments performed Discussed plans with patient for ongoing care management follow up and provided patient with direct contact information for care management team-   COPD: (Status: Goal on Track (progressing): YES.) Long Term Goal  Reviewed medications with patient, including use of prescribed maintenance and rescue inhalers, and provided instruction on medication management and the importance of adherence Advised patient to track and manage COPD triggers Advised patient to self assesses COPD action plan zone and make appointment with provider if in the yellow zone for 48 hours without improvement Provided education about and advised patient to utilize infection prevention strategies to reduce risk of respiratory infection Discussed the importance of adequate rest and management  of fatigue with COPD Assessed social determinant of health barriers Advised patient to limit time outside with increased pollen and rising temperatures   Patient Goals/Self-Care Activities: Take medications as prescribed   Attend all scheduled provider appointments Call pharmacy for medication refills 3-7 days in advance of running out of medications Perform all self care activities independently  Call provider office for new concerns or questions  - avoid second hand smoke - limit outdoor activity during cold weather - keep follow-up appointments:  10/29/21 with [name] - eat healthy/prescribed diet: DASH - get at least 7 to 8 hours of sleep at night       Follow Up:  Patient agrees to Care Plan and Follow-up.  Plan: The Managed Medicaid care management team will reach out to the patient again over the next 30 days.  Date/time of next scheduled RN care management/care coordination outreach:  10/12/21 @ 10:30am  [name] RN, BSN [place] RN Care Coordinator

## 2021-09-09 NOTE — Patient Instructions (Signed)
Visit Information  Mr. Trageser was given information about Medicaid Managed Care team care coordination services as a part of their Healthy [facility] Medicaid benefit. [name] verbally consented to engagement with the [facility] Managed Care team.   If you are experiencing a medical emergency, please call 911 or report to your local emergency department or urgent care.   If you have a non-emergency medical problem during routine business hours, please contact your provider's office and ask to speak with a nurse.   For questions related to your Healthy [facility] health plan, please call: [phone] or visit the homepage here: [email or web address]  If you would like to schedule transportation through your Healthy [facility] plan, please call the following number at least 2 days in advance of your appointment: [phone]  For information about your ride after you set it up, call Ride Assist at [phone]. Use this number to activate a Will Call pickup, or if your transportation is late for a scheduled pickup. Use this number, too, if you need to make a change or cancel a previously scheduled reservation.  If you need transportation services right away, call [phone]. The after-hours call center is staffed 24 hours to handle ride assistance and urgent reservation requests (including discharges) 365 days a year. Urgent trips include sick visits, hospital discharge requests and life-sustaining treatment.  Call the [facility] at [phone], at any time, 24 hours a day, 7 days a week. If you are in danger or need immediate medical attention call 911.  If you would like help to quit smoking, call 1-800-QUIT-NOW 906-642-1662) OR Espaol: 1-855-Djelo-Ya (3-845-364-6803) o para ms informacin haga clic aqu or Text READY to 200-400 to register via text  Mr. Rathbun,   Please see education materials related to COPD provided as print  materials.   The patient verbalized understanding of instructions, educational materials, and care plan provided today and agreed to receive a mailed copy of patient instructions, educational materials, and care plan.   Telephone follow up appointment with Managed Medicaid care management team member scheduled for:10/12/21 @ 10:30am  [name] RN, BSN [place] RN Care Coordinator   Following is a copy of your plan of care:  Care Plan : RN Care Manager Plan of Care  Updates made by [name], RN since 09/09/2021 12:00 AM     Problem: Chronic Disease Management needs in patient with COPD   Priority: High     Long-Range Goal: Plan of care established for for Chronic Disease management for patient with COPD   Start Date: 11/13/2020  Expected End Date: 11/03/2021  Priority: High  Note:   Current Barriers:  Care Coordination needs related to Limited access to food  Chronic Disease Management support and education needs related to COPD Mr. Niznik reports using [name] once daily and it is working great. The heat makes his breathing worse. He is eating good and has a good appetite. He admits to drinking one beer occasionally.  He is needing refills on a few of his medications and prefers delivery due to transportation issues. He will need assistance arranging transportation to his next PCP appointment.  RNCM Clinical Goal(s):  Patient will verbalize understanding of plan for management of COPD as evidenced by taking all medications as prescribed, attending all scheduled appointments and contacting provider with questions and concerns take all medications exactly as prescribed and will call provider for medication related questions as evidenced by refilling medications and taking as  prescribed    attend all scheduled medical appointments: 10/29/21 with PCP as evidenced by provider notes in EMR        demonstrate improved adherence to prescribed treatment  plan for COPD as evidenced by taking all medications as prescribed, attending all scheduled appointments and contacting provider with questions and concerns continue to work with RN Care Manager and/or Social Worker to address care management and care coordination needs related to COPD as evidenced by adherence to CM Team Scheduled appointments     through collaboration with RN Care manager, provider, and care team.   Interventions: Inter-disciplinary care team collaboration (see longitudinal plan of care) Evaluation of current treatment plan related to  self management and patient's adherence to plan as established by provider Reviewed medications, including refill history Collaborated with [place] for needed medications, requested delivery to patient   SDOH Barriers (Status:  Goal on track:  Yes. Patient interviewed and SDOH assessment performed-RNCM will assist with arranging transportation to PCP appointment on 10/29/21 during next outreach        SDOH Interventions    Flowsheet Row Most Recent Value  SDOH Interventions   Transportation Interventions Other (Comment)  [Assisted with arranging transportation with [employer] and having medications delivered]       Patient interviewed and appropriate assessments performed Discussed plans with patient for ongoing care management follow up and provided patient with direct contact information for care management team-   COPD: (Status: Goal on Track (progressing): YES.) Long Term Goal  Reviewed medications with patient, including use of prescribed maintenance and rescue inhalers, and provided instruction on medication management and the importance of adherence Advised patient to track and manage COPD triggers Advised patient to self assesses COPD action plan zone and make appointment with provider if in the yellow zone for 48 hours without improvement Provided education about and advised patient to  utilize infection prevention strategies to reduce risk of respiratory infection Discussed the importance of adequate rest and management of fatigue with COPD Assessed social determinant of health barriers Advised patient to limit time outside with increased pollen and rising temperatures   Patient Goals/Self-Care Activities: Take medications as prescribed   Attend all scheduled provider appointments Call pharmacy for medication refills 3-7 days in advance of running out of medications Perform all self care activities independently  Call provider office for new concerns or questions  - avoid second hand smoke - limit outdoor activity during cold weather - keep follow-up appointments:  10/29/21 with [name] - eat healthy/prescribed diet: DASH - get at least 7 to 8 hours of sleep at night

## 2021-09-10 ENCOUNTER — Other Ambulatory Visit: Payer: Self-pay

## 2021-10-04 DIAGNOSIS — J9601 Acute respiratory failure with hypoxia: Secondary | ICD-10-CM | POA: Diagnosis not present

## 2021-10-04 DIAGNOSIS — F79 Unspecified intellectual disabilities: Secondary | ICD-10-CM | POA: Diagnosis not present

## 2021-10-04 DIAGNOSIS — J9621 Acute and chronic respiratory failure with hypoxia: Secondary | ICD-10-CM | POA: Diagnosis not present

## 2021-10-06 DIAGNOSIS — J9601 Acute respiratory failure with hypoxia: Secondary | ICD-10-CM | POA: Diagnosis not present

## 2021-10-06 DIAGNOSIS — J9621 Acute and chronic respiratory failure with hypoxia: Secondary | ICD-10-CM | POA: Diagnosis not present

## 2021-10-06 DIAGNOSIS — F79 Unspecified intellectual disabilities: Secondary | ICD-10-CM | POA: Diagnosis not present

## 2021-10-11 ENCOUNTER — Emergency Department (HOSPITAL_COMMUNITY): Payer: Medicaid Other

## 2021-10-11 ENCOUNTER — Inpatient Hospital Stay (HOSPITAL_COMMUNITY)
Admission: EM | Admit: 2021-10-11 | Discharge: 2021-10-13 | DRG: 871 | Disposition: A | Discharge status: Home / Self Care | Payer: Medicaid Other | Attending: Internal Medicine | Admitting: Internal Medicine

## 2021-10-11 DIAGNOSIS — Z87891 Personal history of nicotine dependence: Secondary | ICD-10-CM | POA: Diagnosis present

## 2021-10-11 DIAGNOSIS — J9622 Acute and chronic respiratory failure with hypercapnia: Secondary | ICD-10-CM | POA: Diagnosis present

## 2021-10-11 DIAGNOSIS — Z7984 Long term (current) use of oral hypoglycemic drugs: Secondary | ICD-10-CM

## 2021-10-11 DIAGNOSIS — Z825 Family history of asthma and other chronic lower respiratory diseases: Secondary | ICD-10-CM

## 2021-10-11 DIAGNOSIS — J9601 Acute respiratory failure with hypoxia: Secondary | ICD-10-CM

## 2021-10-11 DIAGNOSIS — Z8701 Personal history of pneumonia (recurrent): Secondary | ICD-10-CM

## 2021-10-11 DIAGNOSIS — F17200 Nicotine dependence, unspecified, uncomplicated: Secondary | ICD-10-CM | POA: Diagnosis present

## 2021-10-11 DIAGNOSIS — E1140 Type 2 diabetes mellitus with diabetic neuropathy, unspecified: Secondary | ICD-10-CM | POA: Diagnosis present

## 2021-10-11 DIAGNOSIS — Z9981 Dependence on supplemental oxygen: Secondary | ICD-10-CM

## 2021-10-11 DIAGNOSIS — J441 Chronic obstructive pulmonary disease with (acute) exacerbation: Secondary | ICD-10-CM | POA: Diagnosis not present

## 2021-10-11 DIAGNOSIS — K219 Gastro-esophageal reflux disease without esophagitis: Secondary | ICD-10-CM | POA: Diagnosis present

## 2021-10-11 DIAGNOSIS — R6520 Severe sepsis without septic shock: Secondary | ICD-10-CM | POA: Diagnosis present

## 2021-10-11 DIAGNOSIS — J189 Pneumonia, unspecified organism: Secondary | ICD-10-CM | POA: Diagnosis not present

## 2021-10-11 DIAGNOSIS — Z7960 Long term (current) use of unspecified immunomodulators and immunosuppressants: Secondary | ICD-10-CM

## 2021-10-11 DIAGNOSIS — Z7951 Long term (current) use of inhaled steroids: Secondary | ICD-10-CM

## 2021-10-11 DIAGNOSIS — F17220 Nicotine dependence, chewing tobacco, uncomplicated: Secondary | ICD-10-CM | POA: Diagnosis present

## 2021-10-11 DIAGNOSIS — J440 Chronic obstructive pulmonary disease with acute lower respiratory infection: Secondary | ICD-10-CM | POA: Diagnosis present

## 2021-10-11 DIAGNOSIS — Z8616 Personal history of COVID-19: Secondary | ICD-10-CM

## 2021-10-11 DIAGNOSIS — R918 Other nonspecific abnormal finding of lung field: Secondary | ICD-10-CM | POA: Diagnosis present

## 2021-10-11 DIAGNOSIS — J9621 Acute and chronic respiratory failure with hypoxia: Secondary | ICD-10-CM

## 2021-10-11 DIAGNOSIS — G629 Polyneuropathy, unspecified: Secondary | ICD-10-CM

## 2021-10-11 DIAGNOSIS — E119 Type 2 diabetes mellitus without complications: Secondary | ICD-10-CM

## 2021-10-11 DIAGNOSIS — E860 Dehydration: Secondary | ICD-10-CM | POA: Diagnosis present

## 2021-10-11 DIAGNOSIS — R000 Tachycardia, unspecified: Secondary | ICD-10-CM | POA: Diagnosis not present

## 2021-10-11 DIAGNOSIS — D84821 Immunodeficiency due to drugs: Secondary | ICD-10-CM | POA: Diagnosis present

## 2021-10-11 DIAGNOSIS — A419 Sepsis, unspecified organism: Secondary | ICD-10-CM | POA: Diagnosis not present

## 2021-10-11 DIAGNOSIS — I10 Essential (primary) hypertension: Secondary | ICD-10-CM | POA: Diagnosis present

## 2021-10-11 DIAGNOSIS — F1090 Alcohol use, unspecified, uncomplicated: Secondary | ICD-10-CM | POA: Diagnosis present

## 2021-10-11 DIAGNOSIS — Z79899 Other long term (current) drug therapy: Secondary | ICD-10-CM

## 2021-10-11 DIAGNOSIS — Z7952 Long term (current) use of systemic steroids: Secondary | ICD-10-CM

## 2021-10-11 LAB — I-STAT VENOUS BLOOD GAS, ED
Acid-Base Excess: 1 mmol/L (ref 0.0–2.0)
Bicarbonate: 27 mmol/L (ref 20.0–28.0)
Calcium, Ion: 1.2 mmol/L (ref 1.15–1.40)
HCT: 40 % (ref 39.0–52.0)
Hemoglobin: 13.6 g/dL (ref 13.0–17.0)
O2 Saturation: 77 %
Potassium: 4.3 mmol/L (ref 3.5–5.1)
Sodium: 143 mmol/L (ref 135–145)
TCO2: 28 mmol/L (ref 22–32)
pCO2, Ven: 49.4 mmHg (ref 44–60)
pH, Ven: 7.346 (ref 7.25–7.43)
pO2, Ven: 45 mmHg (ref 32–45)

## 2021-10-11 LAB — CBG MONITORING, ED
Glucose-Capillary: 105 mg/dL — ABNORMAL HIGH (ref 70–99)
Glucose-Capillary: 199 mg/dL — ABNORMAL HIGH (ref 70–99)
Glucose-Capillary: 226 mg/dL — ABNORMAL HIGH (ref 70–99)

## 2021-10-11 LAB — RESPIRATORY PANEL BY PCR

## 2021-10-11 LAB — I-STAT ARTERIAL BLOOD GAS, ED
Acid-Base Excess: 0 mmol/L (ref 0.0–2.0)
Bicarbonate: 28.8 mmol/L — ABNORMAL HIGH (ref 20.0–28.0)
Calcium, Ion: 1.29 mmol/L (ref 1.15–1.40)
HCT: 44 % (ref 39.0–52.0)
Hemoglobin: 15 g/dL (ref 13.0–17.0)
O2 Saturation: 98 %
Patient temperature: 101.8
Potassium: 4.2 mmol/L (ref 3.5–5.1)
Sodium: 144 mmol/L (ref 135–145)
TCO2: 31 mmol/L (ref 22–32)
pCO2 arterial: 68.8 mmHg (ref 32–48)
pH, Arterial: 7.239 — ABNORMAL LOW (ref 7.35–7.45)
pO2, Arterial: 130 mmHg — ABNORMAL HIGH (ref 83–108)

## 2021-10-11 LAB — I-STAT CHEM 8, ED
BUN: 11 mg/dL (ref 6–20)
BUN: 10 mg/dL (ref 6–20)
Calcium, Ion: 1.22 mmol/L (ref 1.15–1.40)
Calcium, Ion: 1.21 mmol/L (ref 1.15–1.40)
Chloride: 108 mmol/L (ref 98–111)
Chloride: 107 mmol/L (ref 98–111)
Creatinine, Ser: 1 mg/dL (ref 0.61–1.24)
Creatinine, Ser: 1 mg/dL (ref 0.61–1.24)
Glucose, Bld: 138 mg/dL — ABNORMAL HIGH (ref 70–99)
Glucose, Bld: 166 mg/dL — ABNORMAL HIGH (ref 70–99)
HCT: 47 % (ref 39.0–52.0)
HCT: 38 % — ABNORMAL LOW (ref 39.0–52.0)
Hemoglobin: 16 g/dL (ref 13.0–17.0)
Hemoglobin: 12.9 g/dL — ABNORMAL LOW (ref 13.0–17.0)
Potassium: 5.1 mmol/L (ref 3.5–5.1)
Potassium: 4.3 mmol/L (ref 3.5–5.1)
Sodium: 145 mmol/L (ref 135–145)
Sodium: 146 mmol/L — ABNORMAL HIGH (ref 135–145)
TCO2: 32 mmol/L (ref 22–32)
TCO2: 29 mmol/L (ref 22–32)

## 2021-10-11 LAB — CBC
HCT: 47.4 % (ref 39.0–52.0)
Hemoglobin: 14.5 g/dL (ref 13.0–17.0)
MCH: 29.5 pg (ref 26.0–34.0)
MCHC: 30.6 g/dL (ref 30.0–36.0)
MCV: 96.3 fL (ref 80.0–100.0)
Platelets: 414 10*3/uL — ABNORMAL HIGH (ref 150–400)
RBC: 4.92 MIL/uL (ref 4.22–5.81)
RDW: 14.3 % (ref 11.5–15.5)
WBC: 25.6 10*3/uL — ABNORMAL HIGH (ref 4.0–10.5)
nRBC: 0 % (ref 0.0–0.2)

## 2021-10-11 LAB — RESP PANEL BY RT-PCR (FLU A&B, COVID) ARPGX2
Influenza A by PCR: NEGATIVE
Influenza B by PCR: NEGATIVE
SARS Coronavirus 2 by RT PCR: NEGATIVE

## 2021-10-11 LAB — LACTIC ACID, PLASMA
Lactic Acid, Venous: 2.5 mmol/L (ref 0.5–1.9)
Lactic Acid, Venous: 1.9 mmol/L (ref 0.5–1.9)

## 2021-10-11 LAB — STREP PNEUMONIAE URINARY ANTIGEN: Strep Pneumo Urinary Antigen: NEGATIVE

## 2021-10-11 MED ORDER — LACTATED RINGERS IV BOLUS (SEPSIS)
1000.0000 mL | Freq: Once | INTRAVENOUS | Status: AC
  Administered 2021-10-11: 1000 mL via INTRAVENOUS

## 2021-10-11 MED ORDER — IPRATROPIUM-ALBUTEROL 0.5-2.5 (3) MG/3ML IN SOLN
3.0000 mL | Freq: Four times a day (QID) | RESPIRATORY_TRACT | Status: DC
  Administered 2021-10-11 – 2021-10-12 (×3): 3 mL via RESPIRATORY_TRACT
  Filled 2021-10-11 (×3): qty 3

## 2021-10-11 MED ORDER — LORAZEPAM 2 MG/ML IJ SOLN
1.0000 mg | Freq: Once | INTRAMUSCULAR | Status: AC
  Administered 2021-10-11: 1 mg via INTRAVENOUS
  Filled 2021-10-11: qty 1

## 2021-10-11 MED ORDER — LORAZEPAM 2 MG/ML IJ SOLN: 1.0000 mg | Freq: Once | INTRAMUSCULAR | Status: DC

## 2021-10-11 MED ORDER — LACTATED RINGERS IV SOLN: INTRAVENOUS | Status: AC

## 2021-10-11 MED ORDER — INSULIN ASPART 100 UNIT/ML IJ SOLN
0.0000 [IU] | Freq: Three times a day (TID) | INTRAMUSCULAR | Status: DC
  Administered 2021-10-11: 3 [IU] via SUBCUTANEOUS
  Administered 2021-10-12: 5 [IU] via SUBCUTANEOUS
  Administered 2021-10-13: 3 [IU] via SUBCUTANEOUS

## 2021-10-11 MED ORDER — SODIUM CHLORIDE 0.9 % IV SOLN
2.0000 g | INTRAVENOUS | Status: DC
  Administered 2021-10-11: 2 g via INTRAVENOUS
  Filled 2021-10-11: qty 20

## 2021-10-11 MED ORDER — SODIUM CHLORIDE 0.9 % IV SOLN
2.0000 g | Freq: Three times a day (TID) | INTRAVENOUS | Status: DC
  Administered 2021-10-11 – 2021-10-13 (×8): 2 g via INTRAVENOUS
  Filled 2021-10-11 (×8): qty 12.5

## 2021-10-11 MED ORDER — ALBUTEROL SULFATE (2.5 MG/3ML) 0.083% IN NEBU
2.5000 mg | INHALATION_SOLUTION | Freq: Four times a day (QID) | RESPIRATORY_TRACT | Status: DC
  Administered 2021-10-11: 2.5 mg via RESPIRATORY_TRACT
  Filled 2021-10-11: qty 3

## 2021-10-11 MED ORDER — ACETAMINOPHEN 325 MG PO TABS
650.0000 mg | ORAL_TABLET | Freq: Four times a day (QID) | ORAL | Status: DC | PRN
  Administered 2021-10-12: 650 mg via ORAL
  Filled 2021-10-11: qty 2

## 2021-10-11 MED ORDER — ONDANSETRON HCL 4 MG/2ML IJ SOLN
4.0000 mg | Freq: Once | INTRAMUSCULAR | Status: AC
  Administered 2021-10-11: 4 mg via INTRAVENOUS
  Filled 2021-10-11: qty 2

## 2021-10-11 MED ORDER — ACETAMINOPHEN 650 MG RE SUPP: 650.0000 mg | Freq: Four times a day (QID) | RECTAL | Status: DC | PRN

## 2021-10-11 MED ORDER — PREDNISONE 20 MG PO TABS
40.0000 mg | ORAL_TABLET | Freq: Every day | ORAL | Status: DC
  Administered 2021-10-12 – 2021-10-13 (×2): 40 mg via ORAL
  Filled 2021-10-11 (×2): qty 2

## 2021-10-11 MED ORDER — LOSARTAN POTASSIUM 50 MG PO TABS
50.0000 mg | ORAL_TABLET | Freq: Every day | ORAL | Status: DC
  Administered 2021-10-12 – 2021-10-13 (×2): 50 mg via ORAL
  Filled 2021-10-11 (×2): qty 1

## 2021-10-11 MED ORDER — PANTOPRAZOLE SODIUM 40 MG PO TBEC
40.0000 mg | DELAYED_RELEASE_TABLET | Freq: Every day | ORAL | Status: DC
  Administered 2021-10-12 – 2021-10-13 (×2): 40 mg via ORAL
  Filled 2021-10-11 (×2): qty 1

## 2021-10-11 MED ORDER — ACETAMINOPHEN 650 MG RE SUPP
650.0000 mg | Freq: Once | RECTAL | Status: AC
  Administered 2021-10-11: 650 mg via RECTAL
  Filled 2021-10-11: qty 1

## 2021-10-11 MED ORDER — ALBUTEROL SULFATE (2.5 MG/3ML) 0.083% IN NEBU: 3.0000 mL | INHALATION_SOLUTION | RESPIRATORY_TRACT | Status: DC | PRN

## 2021-10-11 MED ORDER — ENOXAPARIN SODIUM 40 MG/0.4ML IJ SOSY
40.0000 mg | PREFILLED_SYRINGE | INTRAMUSCULAR | Status: DC
  Administered 2021-10-12 – 2021-10-13 (×2): 40 mg via SUBCUTANEOUS
  Filled 2021-10-11 (×2): qty 0.4

## 2021-10-11 MED ORDER — FLUTICASONE FUROATE-VILANTEROL 200-25 MCG/ACT IN AEPB
1.0000 | INHALATION_SPRAY | Freq: Every day | RESPIRATORY_TRACT | Status: DC
  Filled 2021-10-11: qty 28

## 2021-10-11 MED ORDER — LORAZEPAM 2 MG/ML IJ SOLN
0.5000 mg | Freq: Once | INTRAMUSCULAR | Status: AC
  Administered 2021-10-11: 0.5 mg via INTRAVENOUS
  Filled 2021-10-11: qty 1

## 2021-10-11 MED ORDER — GUAIFENESIN 100 MG/5ML PO LIQD: 5.0000 mL | ORAL | Status: DC | PRN

## 2021-10-11 MED ORDER — UMECLIDINIUM BROMIDE 62.5 MCG/ACT IN AEPB
1.0000 | INHALATION_SPRAY | Freq: Every day | RESPIRATORY_TRACT | Status: DC
  Filled 2021-10-11: qty 7

## 2021-10-11 MED ORDER — SODIUM CHLORIDE 0.9 % IV SOLN
500.0000 mg | INTRAVENOUS | Status: DC
  Administered 2021-10-11: 500 mg via INTRAVENOUS
  Filled 2021-10-11: qty 5

## 2021-10-11 MED ORDER — BUDESON-GLYCOPYRROL-FORMOTEROL 160-9-4.8 MCG/ACT IN AERO: 2.0000 | INHALATION_SPRAY | Freq: Every day | RESPIRATORY_TRACT | Status: DC

## 2021-10-11 MED ORDER — VANCOMYCIN HCL 2000 MG/400ML IV SOLN
2000.0000 mg | Freq: Once | INTRAVENOUS | Status: AC
  Administered 2021-10-11: 2000 mg via INTRAVENOUS
  Filled 2021-10-11: qty 400

## 2021-10-11 MED ORDER — SODIUM CHLORIDE 0.9 % IV SOLN
500.0000 mg | INTRAVENOUS | Status: AC
  Administered 2021-10-12 – 2021-10-13 (×2): 500 mg via INTRAVENOUS
  Filled 2021-10-11 (×2): qty 5

## 2021-10-11 MED ORDER — PREGABALIN 100 MG PO CAPS
100.0000 mg | ORAL_CAPSULE | Freq: Two times a day (BID) | ORAL | Status: DC
  Administered 2021-10-11 – 2021-10-13 (×4): 100 mg via ORAL
  Filled 2021-10-11 (×4): qty 1

## 2021-10-11 MED ORDER — VANCOMYCIN HCL 1750 MG/350ML IV SOLN
1750.0000 mg | INTRAVENOUS | Status: DC
  Filled 2021-10-11: qty 350

## 2021-10-11 MED ORDER — ALBUTEROL SULFATE (2.5 MG/3ML) 0.083% IN NEBU
INHALATION_SOLUTION | RESPIRATORY_TRACT | Status: AC
  Administered 2021-10-11: 5 mg
  Filled 2021-10-11: qty 6

## 2021-10-11 MED ORDER — LACTATED RINGERS IV BOLUS (SEPSIS): 1000.0000 mL | Freq: Once | INTRAVENOUS | Status: DC

## 2021-10-11 MED ORDER — POLYETHYLENE GLYCOL 3350 17 G PO PACK: 17.0000 g | PACK | Freq: Every day | ORAL | Status: DC | PRN

## 2021-10-11 NOTE — Progress Notes (Signed)
Physical Therapy Evaluation Patient Details Name: James [name] MRN: [id number] DOB: 06-Jun-1960 Today's Date: 10/11/2021  History of Present Illness  pt is a 61 y/o male admitted to ED10/8 in acute respiratory failure, with fever and tachycardia.  CXR showed bilateral PNA.  Admitted for sepsis due to PNA.  PMHx:  recurrent PNA, COPD, Covid, DM, HTN,  Clinical Impression  Pt admitted with/for Acute respiratory failure due to PNA.  Pt needing min guard to light min assist for mobility and 2 L O2 to maintain adequate SpO2.[name]  Pt currently limited functionally due to the problems listed below.  (see problems list.)  Pt will benefit from PT to maximize function and safety to be able to get home safely with available assist .        Recommendations for follow up therapy are one component of a multi-disciplinary discharge planning process, led by the attending physician.  Recommendations may be updated based on patient status, additional functional criteria and insurance authorization.  Follow Up Recommendations Home health PT (pt declined HHPT though he could benefit)      Assistance Recommended at Discharge PRN  Patient can return home with the following  Assist for transportation;Help with stairs or ramp for entrance;Assistance with cooking/housework    Equipment Recommendations    Recommendations for Other Services       Functional Status Assessment Patient has had a recent decline in their functional status and demonstrates the ability to make significant improvements in function in a reasonable and predictable amount of time.     Precautions / Restrictions Precautions Precautions: Fall      Mobility  Bed Mobility Overal bed mobility: Needs Assistance Bed Mobility: Supine to Sit     Supine to sit: Min assist          Transfers Overall transfer level: Needs assistance   Transfers: Sit to/from Stand Sit to Stand: Min assist, Min guard           General transfer  comment: steady assist until upright    Ambulation/Gait Ambulation/Gait assistance: Min assist, Min guard Gait Distance (Feet): 100 Feet Assistive device: IV Pole, None Gait Pattern/deviations: Step-through pattern, Decreased stride length Gait velocity: slower Gait velocity interpretation: <1.8 ft/sec, indicate of risk for recurrent falls   General Gait Details: mild instability without AD, deviation, but no overt LOB.  SpO2 on 2L dropped to low of 88% with moderate dyspnea, but pt stopped and quickly recovered to 94% standing holding to rail with coaching for more efficient breathing.  Stairs            Wheelchair Mobility    Modified Rankin (Stroke Patients Only)       Balance Overall balance assessment: Needs assistance Sitting-balance support: No upper extremity supported, Feet supported Sitting balance-Leahy Scale: Fair       Standing balance-Leahy Scale: Fair Standing balance comment: static safe without external support.                             Pertinent Vitals/Pain Pain Assessment Pain Assessment: No/denies pain    Home Living Family/patient expects to be discharged to:: Private residence Living Arrangements: Non-relatives/Friends Available Help at Discharge: Available 24 hours/day Type of Home: House Home Access: Stairs to enter Entrance Stairs-Rails: None Entrance Stairs-Number of Steps: 2   Home Layout: One level Home Equipment: None Additional Comments: On 2 L O2 at home all the time    Prior Function Prior Level  of Function : Independent/Modified Independent             Mobility Comments: no longer drives but goes out with help to get groceries ADLs Comments: Pt reports he tub bathes, but has had a couple of falls getting out of tub, but has placed a mat in bottom of tub which has reduced falls.  he reports he is able to grocery shop mod I.     Hand Dominance   Dominant Hand: Right    Extremity/Trunk Assessment    Upper Extremity Assessment Upper Extremity Assessment: Overall WFL for tasks assessed    Lower Extremity Assessment Lower Extremity Assessment: Generalized weakness;Overall [facility] for tasks assessed    Cervical / Trunk Assessment Cervical / Trunk Assessment: Normal  Communication   Communication: No difficulties  Cognition   Behavior During Therapy: Flat affect, WFL for tasks assessed/performed Overall Cognitive Status: Within Functional Limits for tasks assessed                                          General Comments General comments (skin integrity, edema, etc.): On 2L at rest vss sats to upper 90's and HR in lower 100's    Exercises     Assessment/Plan    PT Assessment Patient needs continued PT services  PT Problem List Decreased strength;Decreased activity tolerance;Decreased balance;Decreased mobility       PT Treatment Interventions Gait training;Functional mobility training;DME instruction;Therapeutic activities;Balance training;Patient/family education;Stair training    PT Goals (Current goals can be found in the Care Plan section)  Acute Rehab PT Goals Patient Stated Goal: go home today PT Goal Formulation: With patient Time For Goal Achievement: 10/25/21 Potential to Achieve Goals: Good    Frequency Min 3X/week     Co-evaluation               AM-PAC PT "6 Clicks" Mobility  Outcome Measure Help needed turning from your back to your side while in a flat bed without using bedrails?: A Little Help needed moving from lying on your back to sitting on the side of a flat bed without using bedrails?: A Little Help needed moving to and from a bed to a chair (including a wheelchair)?: A Little Help needed standing up from a chair using your arms (e.g., wheelchair or bedside chair)?: A Little Help needed to walk in hospital room?: A Little Help needed climbing 3-5 steps with a railing? : A Little 6 Click Score: 18    End of Session  Equipment Utilized During Treatment: Oxygen Activity Tolerance: Patient tolerated treatment well;Patient limited by fatigue Patient left: in chair;with call bell/phone within reach Nurse Communication: Mobility status PT Visit Diagnosis: Unsteadiness on feet (R26.81);Difficulty in walking, not elsewhere classified (R26.2)    Time: 1610-9604 PT Time Calculation (min) (ACUTE ONLY): 32 min   Charges:   PT Evaluation $PT Eval Moderate Complexity: 1 Mod PT Treatments $Gait Training: 8-22 mins        10/11/2021  Ginger Carne., PT Acute Rehabilitation Services [phone]  (office)  James [name] 10/11/2021, 2:43 PM

## 2021-10-11 NOTE — Progress Notes (Addendum)
Pharmacy Antibiotic Note  James [name] is a 61 y.o. male for which pharmacy has been consulted for cefepime and vancomycin dosing for pneumonia and sepsis.  Patient with a history of O2 dependent COPD on chronic steroid therapy, T2DM, HTN, neuropathy. Patient presenting with SOB.  SCr 1 Weight ~96kg (Stated) WBC 25.6; LA 2.5; T 101.8; HR 115>111; RR 25>21 MRSA PCR - ordered  Plan: Azithromycin per MD - monitor QTc Cefepime 2g q8hr Vancomycin 2000 mg once then 1750 mg q24hr (eAUC 523.8) unless change in renal function Trend WBC, Fever, Renal function F/u cultures, clinical course, WBC, fever De-escalate when able Levels at steady state F/u MRSA PCR     Temp (24hrs), Avg:101.8 F (38.8 C), Min:101.8 F (38.8 C), Max:101.8 F (38.8 C)  Recent Labs  Lab 10/11/21 0545 10/11/21 0630 10/11/21 0723 10/11/21 0757  WBC 25.6*  --   --   --   CREATININE  --   --  1.00 1.00  LATICACIDVEN  --  2.5*  --   --     CrCl cannot be calculated (Unknown ideal weight.).    No Known Allergies  Antimicrobials this admission: cefepime 10/8 >>  vancomycin 10/8 >> Azith 10/8 >> (10/11)  Microbiology results: Pending  Thank you for allowing pharmacy to be a part of this patient's care.  Lorelei Pont, PharmD, BCPS 10/11/2021 9:53 AM ED Clinical Pharmacist -  [phone]

## 2021-10-11 NOTE — Progress Notes (Signed)
Pt taken off BiPAP and placed on 2L [place] by RT. Pt tolerating well, vitals stable, RN aware, MD aware, RT will monitor.

## 2021-10-11 NOTE — ED Notes (Signed)
Pt called out to state he needed to use restroom. Pt offered urinal, pt refused - this RN told pt she was going to get bedside commode and for pt to wait in bed. When this RN returned pt was out of bed using urinal. Pt educated on safety issue of getting out of bed, pt educated on use of call light. Pt back in bed at this time and hooked back up to monitor

## 2021-10-11 NOTE — Assessment & Plan Note (Signed)
Per sister, patient has resumed drinking alcohol in April 2023. Also using chewing tobacco regularly.

## 2021-10-11 NOTE — ED Notes (Signed)
RT at bedside.

## 2021-10-11 NOTE — ED Provider Notes (Signed)
7:44 AM Care assumed from Dr. Christy Gentles.  At time of transfer care, patient is awaiting for admission call from hospitalist for admission to likely stepdown unit for bilateral pneumonia causing hypoxia and currently requiring BiPAP.  He reportedly had oxygen saturations in the 70s at home and has had URI symptoms.  Lactic acid returned and was slightly elevated at 2.5.  Due to his work of breathing significant proving on BiPAP per previous team, they feel he is not going to do ICU level care at this time.  They also report he is markedly tachycardic as he got epinephrine with EMS.  We will call for admission now.   James [name], Gwenyth Allegra, MD 10/11/21 1335

## 2021-10-11 NOTE — Sepsis Progress Note (Signed)
Elink following code sepsis °

## 2021-10-11 NOTE — H&P (Addendum)
Date: 10/11/2021               Patient Name:  James [name] MRN: [id number]  DOB: [birth year] Age / Sex: 61 y.o., male   PCP: James Stain, MD         Medical Service: Internal Medicine Teaching Service         Attending Physician: Dr. Lucious Groves, DO    First Contact: Dr. Jodi Mourning Pager: [phone]  Second Contact: Dr. Alfonse Spruce Pager: [phone]       After Hours (After 5p/  First Contact Pager: [phone]  weekends / holidays): Second Contact Pager: [phone]   Chief Complaint: [facility]  History of Present Illness:   Patient drowsy during my encounter, on BiPAP. Opens eyes to verbal stimuli, but non-communicative during encounter. Does not appear to be in acute distress at this time, but unable to gather information from patient given somnolence. History gathered from EMS run sheet, EDP, and chart review.   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  Upon EMS arrival this morning, patient was found to be in mild distress, reporting [facility] for the past couple of days. He tried using his inhaler prior to EMS arrival but found no relief. He had diffuse wheezing and was saturating in the 70s but otherwise exam was noncontributory. Denied any chest pain, abdominal pain, back pain, N/V, dizziness, or weakness. He was started on a NRB and was administered duonebs and solumedrol on scene. En route to [facility], he reported worsening SHOB and thus was administered epinephrine and magnesium, a second dose of albuterol, and was initiated on CPAP.  In the ED, patient was found to be in acute respiratory failure. He was noted to be febrile and tachycardic (sinus tachy on EKG). CBC revealed a significant leukocytosis. CXR showed bilateral PNA. ABG revealed hypercapnia. CODE sepsis was called and he was  started on IV fluids, CAP antibiotic coverage, and BiPAP with improvement in symptoms. Lactate mildly elevated at 2.5. IMTS was asked to admit patient for COPD exacerbation with sepsis 2/2 PNA.  Spoke with sister, James [name], via phone call. She was unaware that patient came to hospital and has not spoke to him over the past couple of days so unable to provide much collateral history. She states that patient has restarted drinking alcohol 6 months ago, thinks that he may be drinking daily. States he told her it is not much, but sister is suspicious of how much. She also states that he does not smoke cigarettes but he does chew tobacco regularly.  Meds:  No current facility-administered medications on file prior to encounter.   Current Outpatient Medications on File Prior to Encounter  Medication Sig Dispense Refill   albuterol (PROVENTIL) (2.5 MG/3ML) 0.083% nebulizer solution Take 3 mLs (2.5 mg total) by nebulization every 6 (six) hours as needed for wheezing or shortness of breath. 270 mL 1   albuterol (VENTOLIN HFA) 108 (90 Base) MCG/ACT inhaler Inhale 2 puffs into the lungs every 4 (four) hours as needed. 18 g 1   Budeson-Glycopyrrol-Formoterol (BREZTRI AEROSPHERE) 160-9-4.8 MCG/ACT AERO Inhale 2 puffs into the lungs in the morning and at bedtime. 2 each 0   clobetasol cream (TEMOVATE) 0.16 % Apply 1 Application topically 2 (two) times daily. 30 g 1   clotrimazole-betamethasone (LOTRISONE) cream Apply 1  Application topically 2 (two) times daily to groin affected areas 45 g 0   Fluticasone-Umeclidin-Vilant (TRELEGY ELLIPTA) 100-62.5-25 MCG/ACT AEPB Inhale 1 puff into the lungs daily. (Patient not taking: Reported on 09/09/2021) 60 each 2   furosemide (LASIX) 20 MG tablet Take 1 tablet (20 mg total) by mouth daily as needed for edema. 30 tablet 2   glipiZIDE (GLUCOTROL XL) 5 MG 24 hr tablet Take 1 tablet (5 mg total) by mouth daily with breakfast. 60 tablet 1   guaiFENesin (ROBITUSSIN) 100 MG/5ML liquid  Take 5 mLs by mouth every 4 (four) hours as needed for cough or to loosen phlegm. (Patient not taking: Reported on 08/06/2021) 120 mL 0   losartan (COZAAR) 50 MG tablet Take 1 tablet (50 mg total) by mouth daily. 90 tablet 1   metoprolol succinate (TOPROL-XL) 25 MG 24 hr tablet Take 1 tablet (25 mg total) by mouth daily. 90 tablet 3   pantoprazole (PROTONIX) 40 MG tablet Take 1 tablet (40 mg total) by mouth daily. 60 tablet 6   predniSONE (DELTASONE) 10 MG tablet Take 1 tablet (10 mg total) by mouth daily with breakfast. 60 tablet 3   pregabalin (LYRICA) 100 MG capsule Take 1 capsule (100 mg total) by mouth 2 (two) times daily. (Patient not taking: Reported on 09/09/2021) 60 capsule 2   Vitamin D3 (VITAMIN D) 25 MCG tablet Take 1 tablet by mouth daily. 60 tablet 2       Allergies: Allergies as of 10/11/2021   (No Known Allergies)   Past Medical History:  Diagnosis Date   Acute on chronic respiratory failure with hypoxia ([place]) 06/08/2013   CAP (community acquired pneumonia) 05/17/2018   See admit 05/17/18 ? rml  with   covid pcr neg - rx augmentin > f/u cxr in 4-6 weeks is fine unless condition declines    Community acquired pneumonia of right lower lobe of lung 05/17/2018   See admit 05/17/18 ? 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   Physical Exam: Blood pressure 139/89, pulse (!) 106, temperature (!) 101.8 F (38.8 C), temperature source Rectal, resp. rate (!) 23, SpO2 97 %. General: drowsy elderly male, laying in bed on BiPAP, NAD. HENT: NCAT Eyes: no scleral icterus, PERRL, EOMI. CV: tachycardic, regular rhythm. No m/r/g. Extremities warm and well perfused. Pulm: Normal work of breathing on BiPAP with mild diffuse end-expiratory wheezing. Diminished bibasilar breaths sounds. GI: soft, nondistended, nontender abdomen with normoactive bowel sounds. MSK: normal bulk and tone, no peripheral edema  noted. Skin: warm and dry. Neuro: drowsy, non-communicative during encounter. Unable to perform thorough neuro exam but moves all extremities spontaneously.   EKG: personally reviewed my interpretation is sinus tachycardia  DG Chest Port 1 View Result Date: 10/11/2021 CLINICAL DATA:  Shortness of breath EXAM: PORTABLE CHEST 1 VIEW COMPARISON:  07/21/2021 FINDINGS: Indistinct opacity in the left more than right lung. Blunting at the lateral left costophrenic sulcus. Normal heart size and mediastinal contours. Extensive remote left rib fractures. IMPRESSION: 1. Bilateral pneumonia with trace left pleural effusion. 2. COPD. Electronically Signed   By: Jorje Guild M.D.   On: 10/11/2021 06:16     Assessment & Plan by Problem: Principal Problem:   Sepsis due to pneumonia [facility]) Active Problems:   Acute on chronic respiratory failure with hypoxia and hypercapnia (HCC)   Tobacco use disorder   Hypertension   Alcohol use disorder   Type 2 diabetes mellitus without complication, without long-term current use of insulin (HCC)   Neuropathy   GERD  (gastroesophageal reflux disease)   Multiple lung nodules on CT   COPD with pneumonia ([place])  Axell D. 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  Sepsis 2/2 bilateral PNA Acute on chronic respiratory failure with hypoxia and hypercarbia COPD exacerbation ([place]) Nocturnal oxygen requirement of 2L [place] Chronic prednisone therapy Patient with history of multiple (4-5) hospital admissions for COPD exacerbation/PNA over the past year. He follows PCCM in the outpatient setting for management of COPD. No documented PFTs, ordered by PCCM but not yet performed. He is on chronic prednisone therapy and has a nocturnal oxygen requirement of 2L [place]. On albuterol and breztri at home. He presents with COPD exacerbation in the setting of sepsis 2/2 bilateral PNA. On arrival, found to be febrile to 101.45F, tachycardic (see separate problem), and tachypneic with labs revealing a significant leukocytosis and a mildly elevated lactate. He received a dose of rocephin and azithromycin in the ED for CAP coverage. Given that patient is on chronic immunosuppressive therapy and has required multiple hospital admissions over the past year, will expand coverage from rocephin to vancomycin and cefepime to cover possible MDR organisms and will continue azithromycin. Can narrow antibiotics once blood cultures return. He received a dose of solumedrol by EMS and will plan to continue prednisone 40mg  daily for 4 more days then transition back to home dose. Will repeat VBG to ensure hypercarbia is resolving prior to discontinuing BiPAP. -continue BiPAP -repeat VBG at noon, can discontinue BiPAP if hypercarbia resolved and patient symptomatically improved -vancomycin and cefepime -azithromycin for total 3  days -prednisone 40mg  for 4 more days, then transition to home dose of 10mg  daily -albuterol inhaler q4 prn -duonebs q6 -budesonide-glycopyrrolate-formoterol 2 puffs daily -robitussin q4 prn -supplemental O2 to maintain saturations between 88-92% given COPD history -received 2L LR, transitioned to LR maintenance at 100cc/hr for 20 hours -flutter valve prn -f/u repeat lactate -f/u respiratory viral panel, legionella urinary antigen, strep pneumo urinary antigen -f/u blood cultures, expectorated sputum with gram [name], and MRSA nasal swab >> can narrow antibiotics pending results -PT/OT eval  Sinus Tachycardia Presented with sinus tachycardia (HR 150s) that is likely multifactorial in origin from underlying infection, dehydration, SHOB from COPD exacerbation, and medications received en route to ED (albuterol, epinephrine). EKG revealed sinus tachycardia. It is improving after  having received IVF and BiPAP therapy, now HR in low 100s. -continue to monitor -receiving IVF and BiPAP -can consider resuming home toprol-xl once more stable  T2DM A1c 6.4% on 07/13/2021. On glipizide monotherapy at home. Appears well controlled without requiring insulin therapy. -moderate SSI -trend CBGs, especially while on higher steroid dose while here -holding home glipizide  HTN BP initially significantly elevated at 188/136, likely indicative of acute distress from sepsis and COPD exacerbation. Improved during my encounter, now 130s-140s/80s. Will restart home losartan 50mg  daily. Will hold home toprol-xl 25mg  daily at this time given likely COPD exacerbation, can resume once more stable and if needed to better control mild tachycardia. -continue home losartan 50mg  daily -holding home toprol-xl, can restart once more stable and/or needed for better control of tachycardia  Incidental pulmonary nodules Tobacco use disorder Patient with several new pulmonary nodules noted on CT chest in 06/2021, with larged  being a 68mm R solid pulmonary nodule. Following PCCM in the outpatient setting, who are planning for repeat imaging in December 2023. He has a history of extensive tobacco use and is currently using chewing tobacco. -f/u PCCM in the outpatient setting -repeat imaging in Dec 2023 -[facility] consult for substance use resources  Neuropathy Alcohol use Disorder Patient with alcohol use disorder. Quit for about a year or so, but, per sister, patient has restarted drinking since April 2023. Unclear amount and last drink, but will place on CIWA without ativan to monitor for withdrawals. Has neuropathy thought to be 2/2 alcohol use per PCP, on lyrica for this. -CIWA without ativan -continue lyrica  GERD -continue home protonix  Dispo: Admit patient to Observation with expected length of stay less than 2 midnights.  Signed: Virl Axe, MD 10/11/2021, 11:18 AM  Pager: [phone] After 5pm on weekdays and 1pm on weekends: On Call pager: [phone]

## 2021-10-11 NOTE — ED Triage Notes (Addendum)
Patient BIB by EMS from home having issues breathing past few days, Lungs sound wheezing bil, SPO\2 89% with movement 78%. Cpap 85%. Nebtx given .albuterol 5 V advent. 125 sol med, 2GM mg, 0.3 epi. Chills. B/P 160/100, HR 130, CBG 196. 20 RAC.

## 2021-10-11 NOTE — ED Notes (Addendum)
Pt is having tremors. Per day shift RN pt has been having these, possibly due to albuterol?? - Pt is intermittently tachycardic on the monitor at 120s-130s. This RN scored CIWA of 11. MD notified of findings and asked for Ativan at this time.

## 2021-10-11 NOTE — ED Notes (Signed)
Pt remains off BiPAP, on 2 liters n/c sats 95 %

## 2021-10-11 NOTE — TOC Initial Note (Signed)
Transition of Care [facility]) - Initial/Assessment Note    Patient Details  Name: James [name] MRN: [id number] Date of Birth: 1960/05/21  Transition of [facility] CM/SW Contact:    Verdell Carmine, RN Phone Number: 10/11/2021, 11:31 AM  Clinical Narrative:                  61 Yo presented with [facility]. Pneumonia. He has a history of COPD, wears oxygen  at night, supplied by adapt. PCO2 66. May need to look at NIV for home and more continuous oxygen. Will need ambulatory saturations closer to DC.   PT and OT ordered for evaluation .   Plan: Admission for pneumonia D/W provider reagrding NIV ordering for home Await recommendations from PT OT   CM will follow for needs, recommendations, and transitions of care Expected Discharge Plan: [place] Barriers to Discharge: Continued Medical Work up   Patient Goals and CMS Choice        Expected Discharge Plan and Services Expected Discharge Plan: [place]   Discharge Planning Services: CM Consult Post Acute Care Choice: Durable Medical Equipment (oxygen at night from adapt) Living arrangements for the past 2 months: Single Family Home                                      Prior Living Arrangements/Services Living arrangements for the past 2 months: Single Family Home Lives with:: Self Patient language and need for interpreter reviewed:: Yes        Need for Family Participation in Patient Care: Yes (Comment) Care giver support system in place?: Yes (comment)   Criminal Activity/Legal Involvement Pertinent to Current Situation/Hospitalization: No - Comment as needed  Activities of Daily Living      Permission Sought/Granted                  Emotional Assessment Appearance:: Appears older than stated age     Orientation: : Oriented to Self, Oriented to Place, Oriented to  Time Alcohol / Substance Use: Not Applicable Psych Involvement: No (comment)  Admission diagnosis:   COPD with pneumonia ([place]) [J44.0, J18.9] Patient Active Problem List   Diagnosis Date Noted   COPD with pneumonia ([place]) 10/11/2021   Sepsis due to pneumonia ([place]) 10/11/2021   Tinea cruris 07/21/2021   COPD with acute exacerbation ([place]) 07/12/2021   Community acquired pneumonia 07/12/2021   Multiple lung nodules on CT 06/11/2021   COPD with chronic bronchitis 02/01/2021   GERD (gastroesophageal reflux disease) 02/01/2021   Neuropathy 09/29/2020   Bronchiectasis ([place]) 09/24/2019   Type 2 diabetes mellitus without complication, without long-term current use of insulin ([place]) 07/24/2019   Other atopic dermatitis 05/01/2019   Chronic respiratory failure with hypoxia ([place]) 04/23/2019   Intellectual disability 04/23/2019   Alcohol use disorder 11/23/2018   Hypertension 05/17/2016   Tobacco use disorder 04/06/2016   COPD mixed type ([place]) 03/26/2016   Acute on chronic respiratory failure with hypoxia and hypercapnia ([place]) 06/08/2013   PCP:  [name], MD Pharmacy:   [facility] [address] [place] [address] Phone: [phone] Fax: [facility] [address] [place] [address] Phone: [phone] Fax: [phone]  [facility] - [place], [place] - [address] [employer]  [address] [place] [address] Phone: [phone] Fax: [phone]  [id number] [place], [place] [address] [place] [address] Phone: [phone] Fax: [phone]  [place] [address] Phone: [phone] Fax: [phone]     Social Determinants of Health (SDOH) Interventions    Readmission Risk Interventions    02/04/2021   12:44 PM 08/14/2020    9:59 AM 04/24/2020    1:03 PM   Readmission Risk Prevention Plan  Post Dischage Appt Complete    Medication Screening Complete    Transportation Screening Complete Complete Complete  PCP or Specialist Appt within 3-5 Days  Complete   Not Complete comments  patient already has an appointment schedules with [name]   [facility] or [place]  Complete   Social Work Consult for [place] Planning/Counseling  Complete   Palliative Care Screening  Not Applicable   Medication Review (RN Care Manager)  Complete   PCP or Specialist appointment within 3-5 days of discharge   Not Complete  PCP/Specialist Appt Not Complete comments   Pt had existing appt 5/23--this is first available per [facility]  [place] or [place]   Complete  SW Recovery Care/Counseling Consult   Complete  Palliative Care Screening   Complete  [place]   Not Applicable

## 2021-10-11 NOTE — ED Provider Notes (Signed)
[facility] EMERGENCY DEPARTMENT Provider Note   CSN: [id number] Arrival date & time: 10/11/21  7829     History  Chief Complaint  Patient presents with   SOB/Bipap   Level 5 caveat due to acuity of condition James [name] is a 61 y.o. male.  The history is provided by the patient. The history is limited by the condition of the patient.  Patient with history of COPD presents with acute shortness of breath.  Patient reported with past several days had increasing shortness of breath.  EMS reported the patient appeared out of breath and was hypoxic on ambulation down to the 70s.  He was placed on CPAP and his pulse ox improved to the 80s. After CPAP, patient was given nebulized therapies, solu- Medrol, magnesium, epinephrine.  Patient is now feeling anxious and is tachycardic Patient denies any chest pain.   Past Medical History:  Diagnosis Date   Acute on chronic respiratory failure with hypoxia ([place]) 06/08/2013   CAP (community acquired pneumonia) 05/17/2018   See admit 05/17/18 ? rml  with   covid pcr neg - rx augmentin > f/u cxr in 4-6 weeks is fine unless condition declines    Community acquired pneumonia of right lower lobe of lung 05/17/2018   See admit 05/17/18 ? 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 Authorizing Provider  albuterol (PROVENTIL) (2.5 MG/3ML) 0.083% nebulizer solution Take 3 mLs (2.5 mg  total) by nebulization every 6 (six) hours as needed for wheezing or shortness of breath. 05/12/21 05/12/22  [name], MD  albuterol (VENTOLIN HFA) 108 (90 Base) MCG/ACT inhaler Inhale 2 puffs into the lungs every 4 (four) hours as needed. 09/09/21   [name], MD  Budeson-Glycopyrrol-Formoterol (BREZTRI AEROSPHERE) 160-9-4.8 MCG/ACT AERO Inhale 2 puffs into the lungs in the morning and at bedtime. 07/28/21   Icard, Octavio Graves, DO  clobetasol cream (TEMOVATE) 5.62 % Apply 1 Application topically 2 (two) times daily. 09/09/21   [name], MD  clotrimazole-betamethasone (LOTRISONE) cream Apply 1 Application topically 2 (two) times daily to groin affected areas 09/09/21   [name], MD  fluconazole (DIFLUCAN) 150 MG tablet Take 1 tablet (150 mg total) by mouth daily. Patient not taking: Reported on 08/06/2021 07/21/21   [name], MD  Fluticasone-Umeclidin-Vilant (TRELEGY ELLIPTA) 100-62.5-25 MCG/ACT AEPB Inhale 1 puff into the lungs daily. Patient not taking: Reported on 09/09/2021 06/29/21   [name], MD  furosemide (LASIX) 20 MG tablet Take 1 tablet (20 mg total) by mouth daily as needed for edema. 08/11/21 08/11/22  [name], MD  glipiZIDE (GLUCOTROL XL) 5 MG 24 hr tablet Take 1 tablet (5 mg total) by mouth daily with breakfast. 06/29/21   [name], MD  guaiFENesin (ROBITUSSIN) 100 MG/5ML liquid Take 5 mLs by mouth every 4 (four) hours  as needed for cough or to loosen phlegm. Patient not taking: Reported on 08/06/2021 05/10/21   [name], MD  losartan (COZAAR) 50 MG tablet Take 1 tablet (50 mg total) by mouth daily. 06/29/21   [name], MD  metoprolol succinate (TOPROL-XL) 25 MG 24 hr tablet Take 1 tablet (25 mg total) by mouth daily. 06/29/21 06/29/22  [name], MD  pantoprazole (PROTONIX) 40 MG tablet Take 1 tablet (40 mg total) by mouth daily. 06/29/21   [name], MD  predniSONE (DELTASONE) 10 MG tablet Take 1 tablet (10  mg total) by mouth daily with breakfast. 07/21/21   [name], MD  pregabalin (LYRICA) 100 MG capsule Take 1 capsule (100 mg total) by mouth 2 (two) times daily. Patient not taking: Reported on 09/09/2021 06/23/21   [name], MD  Vitamin D3 (VITAMIN D) 25 MCG tablet Take 1 tablet by mouth daily. 07/14/21   [name], MD      Allergies    Patient has no known allergies.    Review of Systems   Review of Systems  Unable to perform ROS: Acuity of condition    Physical Exam Updated Vital Signs BP (!) 188/136   Pulse (!) 154   Temp (!) 101.8 F (38.8 C) (Rectal)   Resp (!) 40   SpO2 100%  Physical Exam CONSTITUTIONAL: Anxious, ill-appearing HEAD: Normocephalic/atraumatic EYES: EOMI/PERRL ENMT: Mask in place NECK: supple no meningeal signs CV: S1/S2 noted, tachycardic LUNGS: Tachypneic and wheezing bilaterally ABDOMEN: soft, nontender NEURO: Pt is awake/alert/appropriate, moves all extremitiesx4.  No facial droop.   EXTREMITIES: pulses normal/equal, full ROM, no lower extremity edema SKIN: warm, color normal PSYCH: Anxious  ED Results / Procedures / Treatments   Labs (all labs ordered are listed, but only abnormal results are displayed) Labs Reviewed  CBC - Abnormal; Notable for the following components:      Result Value   WBC 25.6 (*)    Platelets 414 (*)    All other components within normal limits  I-STAT ARTERIAL BLOOD GAS, ED - Abnormal; Notable for the following components:   pH, Arterial 7.239 (*)    pCO2 arterial 68.8 (*)    pO2, Arterial 130 (*)    Bicarbonate 28.8 (*)    All other components within normal limits  I-STAT CHEM 8, ED - Abnormal; Notable for the following components:   Glucose, Bld 138 (*)    All other components within normal limits  SARS CORONAVIRUS 2 BY RT PCR  CULTURE, BLOOD (ROUTINE X 2)  CULTURE, BLOOD (ROUTINE X 2)  RESP PANEL BY RT-PCR (FLU A&B, COVID) ARPGX2  LACTIC ACID, PLASMA  LACTIC ACID, PLASMA  BASIC  METABOLIC PANEL    EKG EKG Interpretation  Date/Time:  Sunday October 11 2021 05:44:40 EDT Ventricular Rate:  155 PR Interval:  83 QRS Duration: 98 QT Interval:  349 QTC Calculation: 561 R Axis:   77 Text Interpretation: Sinus tachycardia Anterior infarct, old Artifact in lead(s) II III aVF V5 V6 and baseline wander in lead(s) V3 Interpretation limited secondary to artifact Confirmed by Ripley Fraise ([id number]) on 10/11/2021 5:52:48 AM  Radiology DG Chest Port 1 View  Result Date: 10/11/2021 CLINICAL DATA:  Shortness of breath EXAM: PORTABLE CHEST 1 VIEW COMPARISON:  07/21/2021 FINDINGS: Indistinct opacity in the left more than right lung. Blunting at the lateral left costophrenic sulcus. Normal heart size and mediastinal contours. Extensive remote left rib fractures. IMPRESSION: 1. Bilateral pneumonia with trace  left pleural effusion. 2. COPD. Electronically Signed   By: Jorje Guild M.D.   On: 10/11/2021 06:16    Procedures .Critical Care  Performed by: Ripley Fraise, MD Authorized by: Ripley Fraise, MD   Critical care provider statement:    Critical care time (minutes):  90   Critical care start time:  10/11/2021 5:45 AM   Critical care end time:  10/11/2021 7:15 AM   Critical care time was exclusive of:  Separately billable procedures and treating other patients   Critical care was necessary to treat or prevent imminent or life-threatening deterioration of the following conditions:  Respiratory failure   Critical care was time spent personally by me on the following activities:  Obtaining history from patient or surrogate, examination of patient, evaluation of patient's response to treatment, ordering and review of radiographic studies, ordering and review of laboratory studies, pulse oximetry, re-evaluation of patient's condition, ordering and performing treatments and interventions, development of treatment plan with patient or surrogate, ventilator management and review of  old charts   I assumed direction of critical care for this patient from another provider in my specialty: no       Medications Ordered in ED Medications  albuterol (PROVENTIL) (2.5 MG/3ML) 0.083% nebulizer solution (has no administration in time range)    ED Course/ Medical Decision Making/ A&P Clinical Course as of 10/11/21 0726  Sun Oct 11, 2021  0546 Patient with known history of COPD presents with acute respiratory failure.  He has been given multiple medications including albuterol and epinephrine by EMS.  This is contributed to his tachycardia and tremors.  Patient is now responding to noninvasive ventilation.  He will require admission [DW]  0602 pCO2 arterial(!!): 68.8 Hypercapnia noted [DW]  0618 Patient febrile, tachycardic and significant leukocytosis.  Code sepsis has been called.  X-ray reveals pneumonia.  IV fluids and antibiotics have been ordered [DW]  0704 Patient is still tachycardic but improving.  Overall his work of breathing is improving.  His tremors are improving.  Patient reports nausea but no pain.  Work-up is pending [DW]  0725 Significant delay in labs.  Patient require admission likely to stepdown. [DW]  0725 Signed out to Dr Sherry Ruffing at shift change to call for admit [DW]    Clinical Course User Index [DW] Ripley Fraise, MD                           Medical Decision Making Amount and/or Complexity of Data Reviewed Labs: ordered. Decision-making details documented in ED Course. Radiology: ordered. ECG/medicine tests: ordered.  Risk OTC drugs. Prescription drug management. Decision regarding hospitalization.   This patient presents to the ED for concern of shortness of breath, this involves an extensive number of treatment options, and is a complaint that carries with it a high risk of complications and morbidity.  The differential diagnosis includes but is not limited to Acute coronary syndrome, pneumonia, acute pulmonary edema, pneumothorax, acute  anemia, pulmonary embolism    Comorbidities that complicate the patient evaluation: Patient's presentation is complicated by their history of COPD  Social Determinants of Health: Patient's  food insecurity and transportation issues   increases the complexity of managing their presentation  Additional history obtained: Records reviewed previous admission documents and Primary Care Documents  Lab Tests: I Ordered, and personally interpreted labs.  The pertinent results include: leukocytosis  Imaging Studies ordered: I ordered imaging studies including X-ray chest   I independently visualized and  interpreted imaging which showed pneumonia I agree with the radiologist interpretation  Cardiac Monitoring: The patient was maintained on a cardiac monitor.  I personally viewed and interpreted the cardiac monitor which showed an underlying rhythm of:  sinus tachycardia  Medicines ordered and prescription drug management: I ordered medication including nebulized therapies for wheezing Reevaluation of the patient after these medicines showed that the patient    improved   Critical Interventions:  Noninvasive ventilation, IV antibiotics  Reevaluation: After the interventions noted above, I reevaluated the patient and found that they have :improved  Complexity of problems addressed: Patient's presentation is most consistent with  acute presentation with potential threat to life or bodily function  Disposition: After consideration of the diagnostic results and the patient's response to treatment,  I feel that the patent would benefit from admission   .           Final Clinical Impression(s) / ED Diagnoses Final diagnoses:  COPD exacerbation ([place])  Acute respiratory failure with hypoxia ([place])  Community acquired pneumonia, unspecified laterality    Rx / DC Orders ED Discharge Orders     None         Ripley Fraise, MD 10/11/21 [phone]

## 2021-10-12 ENCOUNTER — Other Ambulatory Visit: Payer: Self-pay

## 2021-10-12 DIAGNOSIS — K219 Gastro-esophageal reflux disease without esophagitis: Secondary | ICD-10-CM | POA: Diagnosis not present

## 2021-10-12 DIAGNOSIS — R6520 Severe sepsis without septic shock: Secondary | ICD-10-CM | POA: Diagnosis not present

## 2021-10-12 DIAGNOSIS — Z7951 Long term (current) use of inhaled steroids: Secondary | ICD-10-CM | POA: Diagnosis not present

## 2021-10-12 DIAGNOSIS — F17220 Nicotine dependence, chewing tobacco, uncomplicated: Secondary | ICD-10-CM | POA: Diagnosis not present

## 2021-10-12 DIAGNOSIS — D84821 Immunodeficiency due to drugs: Secondary | ICD-10-CM | POA: Diagnosis not present

## 2021-10-12 DIAGNOSIS — Z7984 Long term (current) use of oral hypoglycemic drugs: Secondary | ICD-10-CM | POA: Diagnosis not present

## 2021-10-12 DIAGNOSIS — Z87891 Personal history of nicotine dependence: Secondary | ICD-10-CM | POA: Diagnosis not present

## 2021-10-12 DIAGNOSIS — Z8701 Personal history of pneumonia (recurrent): Secondary | ICD-10-CM | POA: Diagnosis not present

## 2021-10-12 DIAGNOSIS — Z825 Family history of asthma and other chronic lower respiratory diseases: Secondary | ICD-10-CM | POA: Diagnosis not present

## 2021-10-12 DIAGNOSIS — J9601 Acute respiratory failure with hypoxia: Secondary | ICD-10-CM | POA: Diagnosis not present

## 2021-10-12 DIAGNOSIS — R000 Tachycardia, unspecified: Secondary | ICD-10-CM | POA: Diagnosis not present

## 2021-10-12 DIAGNOSIS — J441 Chronic obstructive pulmonary disease with (acute) exacerbation: Secondary | ICD-10-CM | POA: Diagnosis not present

## 2021-10-12 DIAGNOSIS — Z7960 Long term (current) use of unspecified immunomodulators and immunosuppressants: Secondary | ICD-10-CM | POA: Diagnosis not present

## 2021-10-12 DIAGNOSIS — Z8616 Personal history of COVID-19: Secondary | ICD-10-CM | POA: Diagnosis not present

## 2021-10-12 DIAGNOSIS — Z9981 Dependence on supplemental oxygen: Secondary | ICD-10-CM | POA: Diagnosis not present

## 2021-10-12 DIAGNOSIS — A419 Sepsis, unspecified organism: Secondary | ICD-10-CM | POA: Diagnosis not present

## 2021-10-12 DIAGNOSIS — Z7952 Long term (current) use of systemic steroids: Secondary | ICD-10-CM | POA: Diagnosis not present

## 2021-10-12 DIAGNOSIS — J440 Chronic obstructive pulmonary disease with acute lower respiratory infection: Secondary | ICD-10-CM | POA: Diagnosis not present

## 2021-10-12 DIAGNOSIS — Z79899 Other long term (current) drug therapy: Secondary | ICD-10-CM | POA: Diagnosis not present

## 2021-10-12 DIAGNOSIS — J9622 Acute and chronic respiratory failure with hypercapnia: Secondary | ICD-10-CM | POA: Diagnosis not present

## 2021-10-12 DIAGNOSIS — I10 Essential (primary) hypertension: Secondary | ICD-10-CM | POA: Diagnosis not present

## 2021-10-12 DIAGNOSIS — E1140 Type 2 diabetes mellitus with diabetic neuropathy, unspecified: Secondary | ICD-10-CM | POA: Diagnosis not present

## 2021-10-12 DIAGNOSIS — J189 Pneumonia, unspecified organism: Secondary | ICD-10-CM | POA: Diagnosis not present

## 2021-10-12 DIAGNOSIS — J9621 Acute and chronic respiratory failure with hypoxia: Secondary | ICD-10-CM | POA: Diagnosis not present

## 2021-10-12 DIAGNOSIS — E860 Dehydration: Secondary | ICD-10-CM | POA: Diagnosis not present

## 2021-10-12 LAB — CBC WITH DIFFERENTIAL/PLATELET
Abs Immature Granulocytes: 0.14 10*3/uL — ABNORMAL HIGH (ref 0.00–0.07)
Basophils Absolute: 0 10*3/uL (ref 0.0–0.1)
Basophils Relative: 0 %
Eosinophils Absolute: 0 10*3/uL (ref 0.0–0.5)
Eosinophils Relative: 0 %
HCT: 36.2 % — ABNORMAL LOW (ref 39.0–52.0)
Hemoglobin: 11.4 g/dL — ABNORMAL LOW (ref 13.0–17.0)
Immature Granulocytes: 1 %
Lymphocytes Relative: 7 %
Lymphs Abs: 1.3 10*3/uL (ref 0.7–4.0)
MCH: 29.6 pg (ref 26.0–34.0)
MCHC: 31.5 g/dL (ref 30.0–36.0)
MCV: 94 fL (ref 80.0–100.0)
Monocytes Absolute: 1.5 10*3/uL — ABNORMAL HIGH (ref 0.1–1.0)
Monocytes Relative: 8 %
Neutro Abs: 15.9 10*3/uL — ABNORMAL HIGH (ref 1.7–7.7)
Neutrophils Relative %: 84 %
Platelets: 241 10*3/uL (ref 150–400)
RBC: 3.85 MIL/uL — ABNORMAL LOW (ref 4.22–5.81)
RDW: 14.6 % (ref 11.5–15.5)
WBC: 18.8 10*3/uL — ABNORMAL HIGH (ref 4.0–10.5)
nRBC: 0 % (ref 0.0–0.2)

## 2021-10-12 LAB — BASIC METABOLIC PANEL
Anion gap: 9 (ref 5–15)
BUN: 17 mg/dL (ref 6–20)
CO2: 29 mmol/L (ref 22–32)
Calcium: 9.5 mg/dL (ref 8.9–10.3)
Chloride: 106 mmol/L (ref 98–111)
Creatinine, Ser: 0.87 mg/dL (ref 0.61–1.24)
GFR, Estimated: >60 mL/min (ref >60)
Glucose, Bld: 124 mg/dL — ABNORMAL HIGH (ref 70–99)
Potassium: 4 mmol/L (ref 3.5–5.1)
Sodium: 144 mmol/L (ref 135–145)

## 2021-10-12 LAB — GLUCOSE, CAPILLARY
Glucose-Capillary: 97 mg/dL (ref 70–99)
Glucose-Capillary: 216 mg/dL — ABNORMAL HIGH (ref 70–99)
Glucose-Capillary: 147 mg/dL — ABNORMAL HIGH (ref 70–99)

## 2021-10-12 LAB — CBG MONITORING, ED: Glucose-Capillary: 113 mg/dL — ABNORMAL HIGH (ref 70–99)

## 2021-10-12 LAB — HEMOGLOBIN A1C
Hgb A1c MFr Bld: 6.5 % — ABNORMAL HIGH (ref 4.8–5.6)
Mean Plasma Glucose: 139.85 mg/dL

## 2021-10-12 LAB — MRSA NEXT GEN BY PCR, NASAL: MRSA by PCR Next Gen: NOT DETECTED

## 2021-10-12 MED ORDER — IPRATROPIUM-ALBUTEROL 0.5-2.5 (3) MG/3ML IN SOLN
3.0000 mL | Freq: Four times a day (QID) | RESPIRATORY_TRACT | Status: DC
  Administered 2021-10-12 – 2021-10-13 (×4): 3 mL via RESPIRATORY_TRACT
  Filled 2021-10-12 (×4): qty 3

## 2021-10-12 MED ORDER — FLUTICASONE FUROATE-VILANTEROL 200-25 MCG/ACT IN AEPB
1.0000 | INHALATION_SPRAY | Freq: Every day | RESPIRATORY_TRACT | Status: DC
  Administered 2021-10-12 – 2021-10-13 (×2): 1 via RESPIRATORY_TRACT
  Filled 2021-10-12: qty 28

## 2021-10-12 MED ORDER — METOPROLOL SUCCINATE ER 25 MG PO TB24
25.0000 mg | ORAL_TABLET | Freq: Every day | ORAL | Status: DC
  Administered 2021-10-12 – 2021-10-13 (×2): 25 mg via ORAL
  Filled 2021-10-12 (×2): qty 1

## 2021-10-12 MED ORDER — UMECLIDINIUM BROMIDE 62.5 MCG/ACT IN AEPB
1.0000 | INHALATION_SPRAY | Freq: Every day | RESPIRATORY_TRACT | Status: DC
  Administered 2021-10-12 – 2021-10-13 (×2): 1 via RESPIRATORY_TRACT
  Filled 2021-10-12: qty 7

## 2021-10-12 NOTE — Progress Notes (Signed)
Subjective:  Patient reports feeling better today compared to yesterday. His breathing has improved. Denies any new symptoms. Explained reason for hospitalization and plan to continue his medications.    Objective: Vitals over previous 24hr: Vitals:   10/12/21 0740 10/12/21 0943 10/12/21 1200 10/12/21 1228  BP: (!) 140/95 (!) 122/90 (!) 142/80   Pulse: (!) 104 91 96   Resp: 16 19 20    Temp: 98.6 F (37 C) 98.3 F (36.8 C) 98 F (36.7 C)   TempSrc: Oral Oral    SpO2: 99% 97% 96% 100%    General:      awake and alert, lying comfortably in bed, cooperative, not in acute distress Skin:       warm and dry, intact without any obvious lesions or scars, no rashes Head:      normocephalic and atraumatic, oral mucosa moist Lungs:      normal respiratory effort on 2L [place], breathing unlabored, symmetrical chest rise, decreased breath sounds in bases bilaterally, expiratory wheezing throughout Cardiac:      regular rate and rhythm, normal S1 and S2 Neurologic:      oriented to person-place-time, moving all extremities, no gross focal deficits Psychiatric:      mood and affect normal, intelligible speech    Assessment/Plan: Mr Keenum is a 61 year old male with a past medical history of severe COPD on daily prednisone, former tobacco use, diabetes, hypertension, and neuropathy from alcohol use who presents for COPD exacerbation secondary to bilateral pneumonia, now admitted for medical management.    ---Bilateral pneumonia ---Acute on chronic respiratory failure ---Chronic obstructive pulmonary disease exacerbation History of multiple admissions for COPD exacerbation and pneumonia throughout the past year. He takes prednisone, albuterol, Breztri, and 2L oxygen via [place] nightly at home. Presented on 10-8 for another COPD exacerbation with chest radiograph demonstrating bilateral pneumonia. Broad-spectrum antibiotic regimen of vancomycin, cefepime, and azithromycin were administered  given high risk for resistant organisms. Vancomycin discontinued after negative MRSA nasal swab. Repeat VBG on 10-8 demonstrated resolution of hypercarbia, patient weaned from BiPAP to 2L [place] and is oxygenating well. > Cefepime 2g q8, switch to Augmentin 10-10 with last dose 10-12 > Azithromycin 500mg , last dose tomorrow 10-10  > Prednisone 40mg  q24, last dose 10-13 then 10mg  q24 > Ipratropium-albuterol 0.5-2.5mg  q6 > Fluticasone-vilanterol 200-25ug q24 > Umeclidinium bromide 62.5ug q24 > Albuterol 2.5mg  q4 PRN > Guaifenesin 100mg  q4 PRN > Follow blood culture and sputum assessment > BiPAP or CPAP as needed to maintain goal SpO2 88-92% > Ambulatory SpO2 prior to discharge > Anticipate discharge tomorrow 10-10   ---Sinus tachycardia Heart rate upon arrival was tachycardic to 150s, likely from underlying infection and shortness of breath. Albuterol and epinephrine administered en route to the ED almost certainly contributing as well. Improved with intravenous fluids and BiPAP, further improvement after starting his home metoprolol. > Metoprolol succinate 25mg  q24 > Monitor clinically   ---Hypertension Blood pressure elevated upon arrival, likely from distress associated with COPD exacerbation. He takes losartan and metoprolol at home. Improved after initiating home losartan on 10-8 and then metoprolol 10-9. > Losartan 50mg  q24 > Metoprolol succinate 25mg  q24   ---Tobacco use disorder ---Incidental pulmonary nodules Patient reports an extensive history of smoking and chewing tobacco. Chest CT in 06-2021 demonstrated several new pulmonary nodules, the largest of which is a 79mm solid mass in his R lung. A follow-up CT is scheduled for 12-2021 with pulmonary critical care medicine team. > Consult to transitions of care team, appreciate recommendations >  Follow-up with PCCM as outpatient, repeat CT 12-2021   ---Alcohol use disorder ---Neuropathy Patient has long history of alcohol use,  quit for about one year but then restarted again in 04-2021. Time of last drink was 10-7 and has experienced withdrawal before, placed on CIWA protocol for monitoring. His PCP his attributed neuropathy to alcohol use. At home, he takes pregabalin. > Pregabalin 100mg  q12 > Monitor with CIWA, lorazepam per protocol   ---Gastroesophageal reflux disease History of GERD that is stable on pantoprazole. > Pantoprazole 40mg  q24     Principal Problem:   Sepsis due to pneumonia [facility]) Active Problems:   Acute on chronic respiratory failure with hypoxia and hypercapnia ([facility])   Tobacco use disorder   Hypertension   Alcohol use disorder   Type 2 diabetes mellitus without complication, without long-term current use of insulin ([facility])   Neuropathy   GERD (gastroesophageal reflux disease)   Multiple lung nodules on CT   COPD with pneumonia [facility])   Prior to Admission Living Arrangement: home Anticipated Discharge Location: home Barriers to Discharge: symptomatic improvement Dispo: Anticipated discharge in approximately 1 day(s).    Roswell Nickel, MD Internal Medicine PGY-1 Pager [phone]  After 5pm on weekdays and 1pm on weekends: On Call pager [phone]

## 2021-10-12 NOTE — Progress Notes (Signed)
Physical Therapy Treatment Patient Details Name: James [name] MRN: [id number] DOB: [birth year] Today's Date: 10/12/2021   History of Present Illness pt is a 61 y/o male admitted to ED10/8 in acute respiratory failure, with fever and tachycardia.  CXR showed bilateral PNA.  Admitted for sepsis due to PNA.  PMHx:  recurrent PNA, COPD, Covid, DM, HTN,    PT Comments    Pt agreeable to OOB mobility, ambulatory in hallway with no UE support and min unsteadiness but does not require PT assist to correct. See pt sats below, requires cues for breathing technique and rest breaks as needed throughout mobility. PT to continue to follow.  SATURATION QUALIFICATIONS: (This note is used to comply with regulatory documentation for home oxygen)  Patient Saturations on Room Air at Rest = 93%  Patient Saturations on Room Air while Ambulating = 83%  Patient Saturations on 2 Liters of oxygen while Ambulating = 87-94%  Please briefly explain why patient needs home oxygen: to maintain SpO2 at appropriate level during mobility. Pt dropped to 87% for seconds only, quick rebound to >90% on 2LO2     Recommendations for follow up therapy are one component of a multi-disciplinary discharge planning process, led by the attending physician.  Recommendations may be updated based on patient status, additional functional criteria and insurance authorization.  Follow Up Recommendations  Home health PT (pt declined HHPT though he could benefit)     Assistance Recommended at Discharge PRN  Patient can return home with the following Assist for transportation;Help with stairs or ramp for entrance;Assistance with cooking/housework   Equipment Recommendations  None recommended by PT    Recommendations for Other Services       Precautions / Restrictions Precautions Precautions: Fall Restrictions Weight Bearing Restrictions: No     Mobility  Bed Mobility Overal bed mobility: Modified Independent Bed Mobility:  Supine to Sit     Supine to sit: Modified independent (Device/Increase time)     General bed mobility comments: mod I for increased time and HOB elevation    Transfers Overall transfer level: Needs assistance Equipment used: None Transfers: Sit to/from Stand Sit to Stand: Supervision           General transfer comment: for safety, slow to rise    Ambulation/Gait Ambulation/Gait assistance: Supervision Gait Distance (Feet): 220 Feet Assistive device: None Gait Pattern/deviations: Step-through pattern, Decreased stride length, Drifts right/left Gait velocity: decr     General Gait Details: min unsteadiness but pt corrects, cues for upright posture and breathing technique throughout mobility. SPO2 87% and greater on 2LO2, mostly 90-95%   Stairs             Wheelchair Mobility    Modified Rankin (Stroke Patients Only)       Balance Overall balance assessment: Needs assistance Sitting-balance support: No upper extremity supported, Feet supported Sitting balance-Leahy Scale: Fair       Standing balance-Leahy Scale: Fair                              Cognition Arousal/Alertness: Awake/alert Behavior During Therapy: Flat affect, WFL for tasks assessed/performed Overall Cognitive Status: Within Functional Limits for tasks assessed                                          Exercises      General  Comments General comments (skin integrity, edema, etc.): HRmax observed 110s, SpO2 87-95% on 2LO2 during gait      Pertinent Vitals/Pain Pain Assessment Pain Assessment: 0-10 Pain Score: 10-Worst pain ever Pain Location: back Pain Descriptors / Indicators: Sore, Discomfort Pain Intervention(s): Monitored during session, Limited activity within patient's tolerance, Repositioned    Home Living                          Prior Function            PT Goals (current goals can now be found in the care plan section)  Acute Rehab PT Goals Patient Stated Goal: go home today PT Goal Formulation: With patient Time For Goal Achievement: 10/25/21 Potential to Achieve Goals: Good Progress towards PT goals: Progressing toward goals    Frequency    Min 3X/week      PT Plan Current plan remains appropriate    Co-evaluation              AM-PAC PT "6 Clicks" Mobility   Outcome Measure  Help needed turning from your back to your side while in a flat bed without using bedrails?: A Little Help needed moving from lying on your back to sitting on the side of a flat bed without using bedrails?: A Little Help needed moving to and from a bed to a chair (including a wheelchair)?: A Little Help needed standing up from a chair using your arms (e.g., wheelchair or bedside chair)?: A Little Help needed to walk in hospital room?: A Little Help needed climbing 3-5 steps with a railing? : A Little 6 Click Score: 18    End of Session Equipment Utilized During Treatment: Oxygen Activity Tolerance: Patient tolerated treatment well;Patient limited by fatigue Patient left: in chair;with call bell/phone within reach Nurse Communication: Mobility status PT Visit Diagnosis: Unsteadiness on feet (R26.81);Difficulty in walking, not elsewhere classified (R26.2)     Time: 8381-8403 PT Time Calculation (min) (ACUTE ONLY): 22 min  Charges:  $Gait Training: 8-22 mins                     Stacie Glaze, PT DPT Acute Rehabilitation Services Pager [phone]  Office [phone]    [place] 10/12/2021, 4:23 PM

## 2021-10-12 NOTE — Progress Notes (Signed)
Pt was ambulated in the room.Oxygen sat dropped to 87% with 2l of oxygen

## 2021-10-12 NOTE — Discharge Summary (Incomplete)
   Name: James [name] MRN: [id number] DOB: January 01, 1961 61 y.o. PCP: [name], MD  Date of Admission: 10/11/2021  5:27 AM Date of Discharge: No discharge date for patient encounter. Attending Physician: Aldine Contes, MD  Discharge Diagnosis: Principal Problem:   Sepsis due to pneumonia [facility]) Active Problems:   Acute on chronic respiratory failure with hypoxia and hypercapnia (HCC)   Tobacco use disorder   Hypertension   Alcohol use disorder   Type 2 diabetes mellitus without complication, without long-term current use of insulin (HCC)   Neuropathy   GERD (gastroesophageal reflux disease)   Multiple lung nodules on CT   COPD with pneumonia [facility])     Discharge Medications: Allergies as of 10/12/2021   No Known Allergies   Med Rec must be completed prior to using this [facility]***        Disposition and follow-up:    James [name] was discharged from [facility] in {DISCHARGE CONDITION:19696} condition.  At the hospital follow up visit please address:   1.  ***  2.  Labs / imaging needed at time of follow-up: ***  3.  Pending labs/ test needing follow-up: ***    Follow-up Appointments:   Hospital Course by problem list:  Sepsis secondary to bilateral pneumonia Acute on chronic respiratory failure Chronic obstructive pulmonary disease exacerbation History of multiple admissions for COPD exacerbation and pneumonia throughout the past year. Presented on 10-8 for another COPD exacerbation, also met sepsis criteria and chest radiograph revealed bilateral pneumonia. x   Sinus tachycardia x    Discharge Exam:    BP (!) 140/95 (BP Location: Right Arm)   Pulse (!) 104   Temp 98.6 F (37 C) (Oral)   Resp 16   SpO2 99%   Discharge exam: ***   Pertinent Labs, Studies, and Procedures:    Labs: *** ______________________  Imaging: ***  ______________________  Procedures: ***  ______________________   Discharge Instructions:  James [name],  It was a pleasure xxx    Signed: [place] Nickel, MD Internal Medicine PGY-1 Pager [phone]

## 2021-10-12 NOTE — Patient Outreach (Signed)
Care Coordination  10/12/2021  James [name] May 09, 1960 [id number]   James [name] is currently admitted as an inpatient at [facility] team will follow the progress of James [name] and follow up upon discharge.    [name] RN, BSN [place]  [employer] manager

## 2021-10-13 ENCOUNTER — Other Ambulatory Visit (HOSPITAL_COMMUNITY): Payer: Self-pay

## 2021-10-13 DIAGNOSIS — A419 Sepsis, unspecified organism: Secondary | ICD-10-CM | POA: Diagnosis not present

## 2021-10-13 DIAGNOSIS — Z87891 Personal history of nicotine dependence: Secondary | ICD-10-CM | POA: Diagnosis not present

## 2021-10-13 DIAGNOSIS — J9621 Acute and chronic respiratory failure with hypoxia: Secondary | ICD-10-CM | POA: Diagnosis not present

## 2021-10-13 DIAGNOSIS — J9601 Acute respiratory failure with hypoxia: Secondary | ICD-10-CM | POA: Diagnosis not present

## 2021-10-13 DIAGNOSIS — J189 Pneumonia, unspecified organism: Secondary | ICD-10-CM | POA: Diagnosis not present

## 2021-10-13 DIAGNOSIS — F79 Unspecified intellectual disabilities: Secondary | ICD-10-CM | POA: Diagnosis not present

## 2021-10-13 DIAGNOSIS — J441 Chronic obstructive pulmonary disease with (acute) exacerbation: Secondary | ICD-10-CM | POA: Diagnosis not present

## 2021-10-13 LAB — CBC
HCT: 35.5 % — ABNORMAL LOW (ref 39.0–52.0)
Hemoglobin: 11.7 g/dL — ABNORMAL LOW (ref 13.0–17.0)
MCH: 30.6 pg (ref 26.0–34.0)
MCHC: 33 g/dL (ref 30.0–36.0)
MCV: 92.9 fL (ref 80.0–100.0)
Platelets: 257 10*3/uL (ref 150–400)
RBC: 3.82 MIL/uL — ABNORMAL LOW (ref 4.22–5.81)
RDW: 14.6 % (ref 11.5–15.5)
WBC: 14.1 10*3/uL — ABNORMAL HIGH (ref 4.0–10.5)
nRBC: 0 % (ref 0.0–0.2)

## 2021-10-13 LAB — GLUCOSE, CAPILLARY
Glucose-Capillary: 94 mg/dL (ref 70–99)
Glucose-Capillary: 161 mg/dL — ABNORMAL HIGH (ref 70–99)

## 2021-10-13 LAB — LEGIONELLA PNEUMOPHILA SEROGP 1 UR AG: L. pneumophila Serogp 1 Ur Ag: NEGATIVE

## 2021-10-13 MED ORDER — IPRATROPIUM-ALBUTEROL 0.5-2.5 (3) MG/3ML IN SOLN: 3.0000 mL | Freq: Two times a day (BID) | RESPIRATORY_TRACT | Status: DC

## 2021-10-13 MED ORDER — AMOXICILLIN-POT CLAVULANATE 875-125 MG PO TABS
1.0000 | ORAL_TABLET | Freq: Two times a day (BID) | ORAL | 0 refills | Status: AC
  Filled 2021-10-13: qty 4, 2d supply, fill #0

## 2021-10-13 MED ORDER — PREDNISONE 20 MG PO TABS
40.0000 mg | ORAL_TABLET | Freq: Every day | ORAL | 0 refills | Status: AC
  Filled 2021-10-13: qty 4, 2d supply, fill #0

## 2021-10-13 NOTE — Discharge Instructions (Signed)
James [name],  It was a pleasure taking care of you while you were in the hospital. Your shortness of breath was caused by pneumonia and this led to a COPD exacerbation. You are at high risk for pneumonia given your underlying lung disease and daily steroid medication.  We gave you supplemental oxygen to help your breathing and some antibiotics for the pneumonia. Your breathing improved throughout your stay in the hospital. While you were here, we noticed that your oxygen levels dropped a little low while you were walking. At home, we recommend that you use your oxygen during physical activity.  Please continue to take your prescribed medications. We temporarily increased your prednisone to 40mg  for a few days, you can return to your normal dose of 10mg  on 10-14. Also remember to finish the Augmentin antibiotic, your last dose is scheduled for 10-12.  If your shortness of breath or wheezing return, then please visit the emergency department.

## 2021-10-13 NOTE — Evaluation (Signed)
Occupational Therapy Evaluation Patient Details Name: James [name] MRN: [id number] DOB: March 13, 1960 Today's Date: 10/13/2021   History of Present Illness pt is a 61 y/o male admitted to ED10/8 in acute respiratory failure, with fever and tachycardia.  CXR showed bilateral PNA.  Admitted for sepsis due to PNA.  PMHx:  recurrent PNA, COPD, Covid, DM, HTN,   Clinical Impression   Pt admitted as above presenting with deficits as listed below (refer to OT problem list).  Pt is currently supervision for functional mobility, LB dressing and grooming in standing. Pt O2 SATs fluctuated during functional mobility in room and standing ADL's at sink for grooming from 80-92%. Pt benefits from pursed lip breathig technique but requires frequent vc's to perform and keep O2 above 90%. Discussed possibility of HHOT/PT and pt declined stating that he did not feel as though he would need it. Will follow acutely to address deficits with goals of Mod I for anticipated d/c home w/ PRN assist. Recommend 3:1 at d/c for use in tub/shower as pt with history of falls in tub per his report.     Recommendations for follow up therapy are one component of a multi-disciplinary discharge planning process, led by the attending physician.  Recommendations may be updated based on patient status, additional functional criteria and insurance authorization.   Follow Up Recommendations  Home health OT (Pt declined [place] but would likely benefit)    Assistance Recommended at Discharge PRN  Patient can return home with the following Assistance with cooking/housework;Help with stairs or ramp for entrance;Assist for transportation;A little help with bathing/dressing/bathroom    Functional Status Assessment  Patient has had a recent decline in their functional status and demonstrates the ability to make significant improvements in function in a reasonable and predictable amount of time.  Equipment Recommendations  BSC/3in1     Recommendations for Other Services       Precautions / Restrictions Precautions Precautions: Fall;Other (comment) Precaution Comments: Watch O2 Sats Restrictions Weight Bearing Restrictions: No      Mobility Bed Mobility Overal bed mobility: Modified Independent Bed Mobility: Supine to Sit     Supine to sit: Modified independent (Device/Increase time)     General bed mobility comments: mod I for increased time and HOB elevation    Transfers Overall transfer level: Needs assistance Equipment used: None Transfers: Sit to/from Stand, Bed to chair/wheelchair/BSC Sit to Stand: Supervision     Step pivot transfers: Supervision     General transfer comment: Supervision for safety and ambulation in room, slow to rise      Balance Overall balance assessment: Needs assistance Sitting-balance support: No upper extremity supported, Feet supported Sitting balance-Leahy Scale: Good       Standing balance-Leahy Scale: Fair Standing balance comment: static safe without external support.     ADL either performed or assessed with clinical judgement   ADL Overall ADL's : Needs assistance/impaired Eating/Feeding: Modified independent;Bed level;Sitting   Grooming: Wash/dry hands;Wash/dry face;Oral care;Standing Grooming Details (indicate cue type and reason): VC's for pursed lip breathing technique as pt O2 SATs fluctuated during oral care ranging from 92-80% on 2L via [place] Upper Body Bathing: Set up;Sitting   Lower Body Bathing: Set up;Sitting/lateral leans   Upper Body Dressing : Modified independent;Sitting   Lower Body Dressing: Supervision/safety;Sit to/from stand;Sitting/lateral leans   Toilet Transfer: Ambulation;Supervision/safety;BSC/3in1 Armed forces technical officer Details (indicate cue type and reason): Simulated transfer from EOB to chair in room Toileting- Clothing Manipulation and Hygiene: Modified independent;Sitting/lateral lean     Functional  mobility during ADLs:  Supervision/safety General ADL Comments: Pt seen for OT Eval and pt education on role of OT. Pt is currently supervision for functional mobility, LB dressing and grooming in standing. Pt O2 SATs fluctuated during ADL's from 92-80%. Pt benefits from pursed lip breathig technique but requires frequent vc's to perform and keep O2 above 90%. Discussed possibility of HHOT/PT and pt declined stating that he did not feel as though he would need it. Will follow acutely to address deficits with goals of Mod I for anticipated d/c home w/ PRN assist.     Vision Baseline Vision/History:  (Pt reports using readers at times) Ability to See in Adequate Light: 0 Adequate Patient Visual Report: No change from baseline              Pertinent Vitals/Pain Pain Assessment Pain Assessment: No/denies pain     Hand Dominance Right   Extremity/Trunk Assessment Upper Extremity Assessment Upper Extremity Assessment: Overall WFL for tasks assessed   Lower Extremity Assessment Lower Extremity Assessment: Defer to PT evaluation   Cervical / Trunk Assessment Cervical / Trunk Assessment: Normal   Communication Communication Communication: No difficulties   Cognition Arousal/Alertness: Awake/alert Behavior During Therapy: Flat affect, WFL for tasks assessed/performed Overall Cognitive Status: Within Functional Limits for tasks assessed     General Comments  HR max 110's observed, SpO2 80-92% on 2 L via [place] during gait in room and standing ADL's/grooming at sink.     Home Living Family/patient expects to be discharged to:: Private residence Living Arrangements: Non-relatives/Friends Available Help at Discharge: Available 24 hours/day Type of Home: House Home Access: Stairs to enter Technical brewer of Steps: 2 Entrance Stairs-Rails: None Home Layout: One level     Bathroom Shower/Tub: Teacher, early years/pre: Standard Bathroom Accessibility: Yes   Home Equipment: None    Additional Comments: On 2 L O2 at home all the time      Prior Functioning/Environment Prior Level of Function : Independent/Modified Independent     Mobility Comments: no longer drives but goes out with help to get groceries ADLs Comments: Pt reports he tub bathes, but has had a couple of falls getting out of tub, but has placed a mat in bottom of tub which has reduced falls.  he reports he is able to grocery shop mod I.        OT Problem List: Decreased activity tolerance;Cardiopulmonary status limiting activity;Decreased knowledge of precautions      OT Treatment/Interventions: Self-care/ADL training;Patient/family education;DME and/or AE instruction;Energy conservation;Therapeutic activities    OT Goals(Current goals can be found in the care plan section) Acute Rehab OT Goals Patient Stated Goal: Go home OT Goal Formulation: With patient Time For Goal Achievement: 10/27/21 Potential to Achieve Goals: Fair  OT Frequency: Min 2X/week       AM-PAC OT "6 Clicks" Daily Activity     Outcome Measure Help from another person eating meals?: None Help from another person taking care of personal grooming?: A Little Help from another person toileting, which includes using toliet, bedpan, or urinal?: A Little Help from another person bathing (including washing, rinsing, drying)?: A Little Help from another person to put on and taking off regular upper body clothing?: None Help from another person to put on and taking off regular lower body clothing?: A Little 6 Click Score: 20   End of Session Equipment Utilized During Treatment: Oxygen Nurse Communication: Mobility status;Other (comment) (Pt sitting up in chair)  Activity Tolerance: Patient tolerated treatment  well Patient left: in chair;with call bell/phone within reach  OT Visit Diagnosis: Unsteadiness on feet (R26.81);Muscle weakness (generalized) (M62.81)                Time: 5885-0277 OT Time Calculation (min): 30  min Charges:  OT General Charges $OT Visit: 1 Visit OT Evaluation $OT Eval Low Complexity: 1 Low  Joselyn Edling Beth Dixon, OTR/L 10/13/2021, 10:17 AM

## 2021-10-13 NOTE — TOC Transition Note (Signed)
Transition of Care [facility]) - CM/SW Discharge Note   Patient Details  Name: James [name] MRN: [id number] Date of Birth: 07-02-1960  Transition of [facility] CM/SW Contact:  Pollie Friar, RN Phone Number: 10/13/2021, 12:53 PM   Clinical Narrative:    Pt is discharging home with self care. Pt refusing [place] services.  Pt uses oxygen at home through [place]. CM has ordered a transport tank for returning home. [employer] will deliver the tank to the room. Medications for home delivered to the room per [place].  Pt uses [employer] through his medicaid for transportation. CM has scheduled a pick up for discharge for 4 pm. Bedside RN updated.    Final next level of care: Home/Self Care Barriers to Discharge: No Barriers Identified   Patient Goals and CMS Choice        Discharge Placement                       Discharge Plan and Services   Discharge Planning Services: CM Consult Post Acute Care Choice: Durable Medical Equipment (oxygen at night from [employer])                               Social Determinants of Health (SDOH) Interventions     Readmission Risk Interventions    02/04/2021   12:44 PM 08/14/2020    9:59 AM 04/24/2020    1:03 PM  Readmission Risk Prevention Plan  Post Dischage Appt Complete    Medication Screening Complete    Transportation Screening Complete Complete Complete  PCP or Specialist Appt within 3-5 Days  Complete   Not Complete comments  patient already has an appointment schedules with [name]   [facility] or [place]  Complete   Social Work Consult for [place] Planning/Counseling  Complete   Palliative Care Screening  Not Applicable   Medication Review (RN Care Manager)  Complete   PCP or Specialist appointment within 3-5 days of discharge   Not Complete  PCP/Specialist Appt Not Complete comments   Pt had existing appt 5/23--this is first available per [facility]  [place] or [place]   Complete  SW Recovery  Care/Counseling Consult   Complete  Palliative Care Screening   Complete  [place]   Not Applicable

## 2021-10-14 ENCOUNTER — Telehealth: Payer: Self-pay | Admitting: *Deleted

## 2021-10-14 ENCOUNTER — Other Ambulatory Visit: Payer: Self-pay

## 2021-10-14 ENCOUNTER — Other Ambulatory Visit: Payer: Self-pay | Admitting: Critical Care Medicine

## 2021-10-14 MED ORDER — BREZTRI AEROSPHERE 160-9-4.8 MCG/ACT IN AERO
2.0000 | INHALATION_SPRAY | Freq: Two times a day (BID) | RESPIRATORY_TRACT | 11 refills | Status: DC
  Filled 2021-10-14 – 2021-11-06 (×4): qty 10.7, 30d supply, fill #0
  Filled 2022-01-18: qty 10.7, 30d supply, fill #1
  Filled 2022-04-12: qty 10.7, 30d supply, fill #0
  Filled 2022-05-25: qty 10.7, 30d supply, fill #1
  Filled 2022-06-30 (×2): qty 10.7, 30d supply, fill #2
  Filled 2022-08-24: qty 10.7, 30d supply, fill #0

## 2021-10-14 MED ORDER — PREGABALIN 100 MG PO CAPS
100.0000 mg | ORAL_CAPSULE | Freq: Two times a day (BID) | ORAL | 2 refills | Status: DC
  Filled 2021-10-14: qty 60, 30d supply, fill #0
  Filled 2021-11-24: qty 60, 30d supply, fill #1

## 2021-10-14 NOTE — Telephone Encounter (Signed)
-----   Message from [name], RN sent at 10/14/2021 11:00 AM EDT ----- Regarding: needing refill Hello,   Mr. Neilan is needing a prescription for [facility] sent to [place] [address] [place] ast listed in [place]. Thank you  [name] RN, BSN [place] RN Care Coordinator

## 2021-10-14 NOTE — Telephone Encounter (Signed)
Prescription for breztri 2 puffs twice daily sent to his preferred pharmacy.

## 2021-10-14 NOTE — Patient Outreach (Signed)
Care Coordination  10/14/2021  James [name] 06-23-1960 [id number]   Transition Care Management Follow-up Telephone Call Date of discharge and from where: 10/13/21 from [facility] How have you been since you were released from the hospital? "Feeling about the same" Any questions or concerns? Yes, Patient is out of maintenance medications and needs refill to be delivered  Items Reviewed: Did the pt receive and understand the discharge instructions provided? Yes  Medications obtained and verified? Yes  Other? Yes Patient needs refills on maintenance medications Any new allergies since your discharge? Yes  Dietary orders reviewed? Yes Do you have support at home? Yes   Home Care and Equipment/Supplies: Were home health services ordered? no If so, what is the name of the agency? N/A  Has the agency set up a time to come to the patient's home? not applicable Were any new equipment or medical supplies ordered?  No What is the name of the medical supply agency? N/A Were you able to get the supplies/equipment? not applicable Do you have any questions related to the use of the equipment or supplies? No  Functional Questionnaire: (I = Independent and D = Dependent) ADLs: I  Bathing/Dressing- I  Meal Prep- I  Eating- I  Maintaining continence- I  Transferring/Ambulation- I  Managing Meds- I  Follow up appointments reviewed:  PCP Hospital f/u appt confirmed? Yes  Scheduled to see PCP on 10/29/21 @ 10:30am. James [name] Hospital f/u appt confirmed? No   Are transportation arrangements needed? Yes RNCM assisted with arranging transportation to PCP appointment on 10/29/21, pick up time 9:30am, return ride home at 12:00 If their condition worsens, is the pt aware to call PCP or go to the Emergency Dept.? Yes Was the patient provided with contact information for the PCP's office or ED? Yes Was to pt encouraged to call back with questions or concerns? Yes   RNCM contacted pharmacy  and requested needed medication refills and delivery. Patient instructed to contact pharmacy if medications not received by Friday. Message sent to Dr. Valeta Harms regarding needed James [name] prescription-samples had been given at office appointment.  James Joiner RN, BSN [place]  [employer] manager

## 2021-10-15 ENCOUNTER — Other Ambulatory Visit: Payer: Self-pay

## 2021-10-15 ENCOUNTER — Telehealth: Payer: Self-pay

## 2021-10-15 ENCOUNTER — Other Ambulatory Visit (HOSPITAL_COMMUNITY): Payer: Self-pay

## 2021-10-15 NOTE — Telephone Encounter (Signed)
PA request received through [facility] for [facility].  PA has been completed and submitted.   PA has been APPROVED from 10/15/2021 through 10/15/2022.  Patients copay for 30 days is $4.00

## 2021-10-16 ENCOUNTER — Other Ambulatory Visit: Payer: Self-pay

## 2021-10-16 LAB — CULTURE, BLOOD (ROUTINE X 2)
Culture: NO GROWTH
Culture: NO GROWTH
Special Requests: ADEQUATE
Special Requests: ADEQUATE

## 2021-10-19 ENCOUNTER — Other Ambulatory Visit: Payer: Self-pay

## 2021-10-22 ENCOUNTER — Telehealth: Payer: Self-pay

## 2021-10-22 NOTE — Telephone Encounter (Signed)
I called James [name]: [phone] regarding the CMN for O2 and spoke to Melody/ Authorization Dept.   She said that the CMN needs to be signed by a provider and it does not have to be [name].  They also need office visit notes including results of a walk test.  I explained to her that the walk test was done 10/12/2021 when the patient was in the hospital not by the PCP or covering provider.  She said she is not sure they can accept that walk test and will need to call me back.  I also explained that our clinic does not release medical records from the hospital, only from our clinic providers.   Awaiting call back.

## 2021-10-23 ENCOUNTER — Other Ambulatory Visit: Payer: Self-pay | Admitting: *Deleted

## 2021-10-23 NOTE — Patient Instructions (Signed)
Visit Information  [name]  - as a part of your Medicaid benefit, you are eligible for care management and care coordination services at no cost or copay. I was unable to reach you by phone today but would be happy to help you with your health related needs. Please feel free to call me @ [phone].   A member of the Managed Medicaid care management team will reach out to you again over the next 14 days.   [name] RN, BSN [place]  [employer] manager

## 2021-10-23 NOTE — Patient Outreach (Signed)
  Medicaid Managed Care   Unsuccessful Attempt Note   10/23/2021 Name: James [name] MRN: [id number] DOB: February 02, 1960  Referred by: [name], MD Reason for referral : High Risk Managed Medicaid (Unsuccessful RNCM follow up telephone outreach)   An unsuccessful telephone outreach was attempted today. The patient was referred to the case management team for assistance with care management and care coordination.    Follow Up Plan: The Managed Medicaid care management team will reach out to the patient again over the next 14 days.    [name] RN, BSN [place]  [employer] manager

## 2021-10-23 NOTE — Telephone Encounter (Signed)
Please when she calls back can you please schedule an appointment for this patient to have a walk test so form can be completed.  Thanks.

## 2021-10-26 ENCOUNTER — Inpatient Hospital Stay (HOSPITAL_COMMUNITY)
Admission: EM | Admit: 2021-10-26 | Discharge: 2021-10-29 | DRG: 177 | Disposition: A | Discharge status: Home / Self Care | Payer: Medicaid Other | Attending: Student in an Organized Health Care Education/Training Program | Admitting: Student in an Organized Health Care Education/Training Program

## 2021-10-26 ENCOUNTER — Emergency Department (HOSPITAL_COMMUNITY): Payer: Medicaid Other

## 2021-10-26 DIAGNOSIS — J439 Emphysema, unspecified: Secondary | ICD-10-CM | POA: Diagnosis not present

## 2021-10-26 DIAGNOSIS — Z79899 Other long term (current) drug therapy: Secondary | ICD-10-CM

## 2021-10-26 DIAGNOSIS — Z7984 Long term (current) use of oral hypoglycemic drugs: Secondary | ICD-10-CM | POA: Diagnosis not present

## 2021-10-26 DIAGNOSIS — J9622 Acute and chronic respiratory failure with hypercapnia: Secondary | ICD-10-CM | POA: Diagnosis present

## 2021-10-26 DIAGNOSIS — E119 Type 2 diabetes mellitus without complications: Secondary | ICD-10-CM

## 2021-10-26 DIAGNOSIS — J441 Chronic obstructive pulmonary disease with (acute) exacerbation: Secondary | ICD-10-CM | POA: Diagnosis present

## 2021-10-26 DIAGNOSIS — J690 Pneumonitis due to inhalation of food and vomit: Principal | ICD-10-CM | POA: Diagnosis present

## 2021-10-26 DIAGNOSIS — Z8616 Personal history of COVID-19: Secondary | ICD-10-CM | POA: Diagnosis not present

## 2021-10-26 DIAGNOSIS — F10231 Alcohol dependence with withdrawal delirium: Secondary | ICD-10-CM | POA: Diagnosis present

## 2021-10-26 DIAGNOSIS — J189 Pneumonia, unspecified organism: Secondary | ICD-10-CM | POA: Diagnosis not present

## 2021-10-26 DIAGNOSIS — J9600 Acute respiratory failure, unspecified whether with hypoxia or hypercapnia: Secondary | ICD-10-CM | POA: Diagnosis not present

## 2021-10-26 DIAGNOSIS — F17200 Nicotine dependence, unspecified, uncomplicated: Secondary | ICD-10-CM | POA: Diagnosis present

## 2021-10-26 DIAGNOSIS — Z7952 Long term (current) use of systemic steroids: Secondary | ICD-10-CM | POA: Diagnosis not present

## 2021-10-26 DIAGNOSIS — J9601 Acute respiratory failure with hypoxia: Secondary | ICD-10-CM | POA: Diagnosis not present

## 2021-10-26 DIAGNOSIS — R9431 Abnormal electrocardiogram [ECG] [EKG]: Secondary | ICD-10-CM | POA: Diagnosis not present

## 2021-10-26 DIAGNOSIS — F1090 Alcohol use, unspecified, uncomplicated: Secondary | ICD-10-CM | POA: Diagnosis present

## 2021-10-26 DIAGNOSIS — R09A2 Foreign body sensation, throat: Secondary | ICD-10-CM

## 2021-10-26 DIAGNOSIS — Z902 Acquired absence of lung [part of]: Secondary | ICD-10-CM

## 2021-10-26 DIAGNOSIS — E872 Acidosis, unspecified: Secondary | ICD-10-CM | POA: Diagnosis present

## 2021-10-26 DIAGNOSIS — K449 Diaphragmatic hernia without obstruction or gangrene: Secondary | ICD-10-CM | POA: Diagnosis not present

## 2021-10-26 DIAGNOSIS — Z87891 Personal history of nicotine dependence: Secondary | ICD-10-CM | POA: Diagnosis present

## 2021-10-26 DIAGNOSIS — Z720 Tobacco use: Secondary | ICD-10-CM

## 2021-10-26 DIAGNOSIS — J9621 Acute and chronic respiratory failure with hypoxia: Secondary | ICD-10-CM | POA: Diagnosis present

## 2021-10-26 DIAGNOSIS — R0689 Other abnormalities of breathing: Secondary | ICD-10-CM | POA: Diagnosis not present

## 2021-10-26 DIAGNOSIS — K219 Gastro-esophageal reflux disease without esophagitis: Secondary | ICD-10-CM | POA: Diagnosis not present

## 2021-10-26 DIAGNOSIS — Z1152 Encounter for screening for COVID-19: Secondary | ICD-10-CM | POA: Diagnosis not present

## 2021-10-26 DIAGNOSIS — R0902 Hypoxemia: Secondary | ICD-10-CM | POA: Diagnosis not present

## 2021-10-26 DIAGNOSIS — R000 Tachycardia, unspecified: Secondary | ICD-10-CM | POA: Diagnosis not present

## 2021-10-26 DIAGNOSIS — J440 Chronic obstructive pulmonary disease with acute lower respiratory infection: Secondary | ICD-10-CM | POA: Diagnosis not present

## 2021-10-26 DIAGNOSIS — I10 Essential (primary) hypertension: Secondary | ICD-10-CM | POA: Diagnosis present

## 2021-10-26 DIAGNOSIS — K224 Dyskinesia of esophagus: Secondary | ICD-10-CM | POA: Diagnosis not present

## 2021-10-26 DIAGNOSIS — J9602 Acute respiratory failure with hypercapnia: Secondary | ICD-10-CM | POA: Diagnosis not present

## 2021-10-26 DIAGNOSIS — Z9981 Dependence on supplemental oxygen: Secondary | ICD-10-CM | POA: Diagnosis not present

## 2021-10-26 DIAGNOSIS — R457 State of emotional shock and stress, unspecified: Secondary | ICD-10-CM | POA: Diagnosis not present

## 2021-10-26 DIAGNOSIS — E1140 Type 2 diabetes mellitus with diabetic neuropathy, unspecified: Secondary | ICD-10-CM | POA: Diagnosis present

## 2021-10-26 DIAGNOSIS — J159 Unspecified bacterial pneumonia: Secondary | ICD-10-CM | POA: Diagnosis not present

## 2021-10-26 DIAGNOSIS — F458 Other somatoform disorders: Secondary | ICD-10-CM | POA: Diagnosis not present

## 2021-10-26 LAB — COMPREHENSIVE METABOLIC PANEL
ALT: 18 U/L (ref 0–44)
AST: 29 U/L (ref 15–41)
Albumin: 3.6 g/dL (ref 3.5–5.0)
Alkaline Phosphatase: 42 U/L (ref 38–126)
Anion gap: 10 (ref 5–15)
BUN: 11 mg/dL (ref 6–20)
CO2: 30 mmol/L (ref 22–32)
Calcium: 8.9 mg/dL (ref 8.9–10.3)
Chloride: 105 mmol/L (ref 98–111)
Creatinine, Ser: 1.13 mg/dL (ref 0.61–1.24)
GFR, Estimated: >60 mL/min (ref >60)
Glucose, Bld: 118 mg/dL — ABNORMAL HIGH (ref 70–99)
Potassium: 4.4 mmol/L (ref 3.5–5.1)
Sodium: 145 mmol/L (ref 135–145)
Total Bilirubin: 0.5 mg/dL (ref 0.3–1.2)
Total Protein: 5.8 g/dL — ABNORMAL LOW (ref 6.5–8.1)

## 2021-10-26 LAB — RESPIRATORY PANEL BY PCR

## 2021-10-26 LAB — CBC WITH DIFFERENTIAL/PLATELET
Abs Immature Granulocytes: 0 10*3/uL (ref 0.00–0.07)
Basophils Absolute: 0 10*3/uL (ref 0.0–0.1)
Basophils Relative: 0 %
Eosinophils Absolute: 0.3 10*3/uL (ref 0.0–0.5)
Eosinophils Relative: 1 %
HCT: 46.7 % (ref 39.0–52.0)
Hemoglobin: 14.7 g/dL (ref 13.0–17.0)
Lymphocytes Relative: 14 %
Lymphs Abs: 4.6 10*3/uL — ABNORMAL HIGH (ref 0.7–4.0)
MCH: 29.7 pg (ref 26.0–34.0)
MCHC: 31.5 g/dL (ref 30.0–36.0)
MCV: 94.3 fL (ref 80.0–100.0)
Monocytes Absolute: 1 10*3/uL (ref 0.1–1.0)
Monocytes Relative: 3 %
Neutro Abs: 27.2 10*3/uL — ABNORMAL HIGH (ref 1.7–7.7)
Neutrophils Relative %: 82 %
Platelets: 383 10*3/uL (ref 150–400)
RBC: 4.95 MIL/uL (ref 4.22–5.81)
RDW: 14.3 % (ref 11.5–15.5)
WBC: 33.2 10*3/uL — ABNORMAL HIGH (ref 4.0–10.5)
nRBC: 0 % (ref 0.0–0.2)
nRBC: 0 /100 WBC

## 2021-10-26 LAB — LACTIC ACID, PLASMA
Lactic Acid, Venous: 2.1 mmol/L (ref 0.5–1.9)
Lactic Acid, Venous: 3.3 mmol/L (ref 0.5–1.9)
Lactic Acid, Venous: 2.6 mmol/L (ref 0.5–1.9)
Lactic Acid, Venous: 2.3 mmol/L (ref 0.5–1.9)

## 2021-10-26 LAB — CBG MONITORING, ED
Glucose-Capillary: 185 mg/dL — ABNORMAL HIGH (ref 70–99)
Glucose-Capillary: 150 mg/dL — ABNORMAL HIGH (ref 70–99)

## 2021-10-26 LAB — SARS CORONAVIRUS 2 BY RT PCR: SARS Coronavirus 2 by RT PCR: NEGATIVE

## 2021-10-26 LAB — STREP PNEUMONIAE URINARY ANTIGEN: Strep Pneumo Urinary Antigen: NEGATIVE

## 2021-10-26 LAB — BRAIN NATRIURETIC PEPTIDE: B Natriuretic Peptide: 33.4 pg/mL (ref 0.0–100.0)

## 2021-10-26 MED ORDER — THIAMINE HCL 100 MG/ML IJ SOLN
100.0000 mg | Freq: Every day | INTRAMUSCULAR | Status: DC
  Administered 2021-10-27: 100 mg via INTRAVENOUS
  Filled 2021-10-26 (×2): qty 2

## 2021-10-26 MED ORDER — SODIUM CHLORIDE 0.9 % IV SOLN
500.0000 mg | Freq: Once | INTRAVENOUS | Status: AC
  Administered 2021-10-26: 500 mg via INTRAVENOUS
  Filled 2021-10-26: qty 5

## 2021-10-26 MED ORDER — ADULT MULTIVITAMIN W/MINERALS CH
1.0000 | ORAL_TABLET | Freq: Every day | ORAL | Status: DC
  Administered 2021-10-26 – 2021-10-29 (×4): 1 via ORAL
  Filled 2021-10-26 (×4): qty 1

## 2021-10-26 MED ORDER — LACTATED RINGERS IV BOLUS (SEPSIS)
1000.0000 mL | Freq: Once | INTRAVENOUS | Status: AC
  Administered 2021-10-26: 1000 mL via INTRAVENOUS

## 2021-10-26 MED ORDER — PREDNISONE 5 MG PO TABS
10.0000 mg | ORAL_TABLET | Freq: Every day | ORAL | Status: DC
  Administered 2021-10-26: 10 mg via ORAL
  Filled 2021-10-26: qty 2

## 2021-10-26 MED ORDER — BUDESON-GLYCOPYRROL-FORMOTEROL 160-9-4.8 MCG/ACT IN AERO: 2.0000 | INHALATION_SPRAY | Freq: Two times a day (BID) | RESPIRATORY_TRACT | Status: DC

## 2021-10-26 MED ORDER — UMECLIDINIUM BROMIDE 62.5 MCG/ACT IN AEPB
1.0000 | INHALATION_SPRAY | Freq: Every day | RESPIRATORY_TRACT | Status: DC
  Administered 2021-10-28 – 2021-10-29 (×2): 1 via RESPIRATORY_TRACT
  Filled 2021-10-26: qty 7

## 2021-10-26 MED ORDER — PREDNISONE 20 MG PO TABS
40.0000 mg | ORAL_TABLET | Freq: Every day | ORAL | Status: DC
  Administered 2021-10-27 – 2021-10-29 (×3): 40 mg via ORAL
  Filled 2021-10-26 (×3): qty 2

## 2021-10-26 MED ORDER — INSULIN ASPART 100 UNIT/ML IJ SOLN
0.0000 [IU] | Freq: Three times a day (TID) | INTRAMUSCULAR | Status: DC
  Administered 2021-10-27 – 2021-10-28 (×4): 2 [IU] via SUBCUTANEOUS
  Administered 2021-10-29: 1 [IU] via SUBCUTANEOUS

## 2021-10-26 MED ORDER — VANCOMYCIN HCL IN DEXTROSE 1-5 GM/200ML-% IV SOLN
1000.0000 mg | Freq: Once | INTRAVENOUS | Status: AC
  Administered 2021-10-26: 1000 mg via INTRAVENOUS
  Filled 2021-10-26: qty 200

## 2021-10-26 MED ORDER — IPRATROPIUM-ALBUTEROL 0.5-2.5 (3) MG/3ML IN SOLN
3.0000 mL | RESPIRATORY_TRACT | Status: DC | PRN
  Administered 2021-10-26 – 2021-10-27 (×3): 3 mL via RESPIRATORY_TRACT
  Filled 2021-10-26 (×3): qty 3

## 2021-10-26 MED ORDER — ACETAMINOPHEN 325 MG PO TABS: 650.0000 mg | ORAL_TABLET | Freq: Four times a day (QID) | ORAL | Status: DC | PRN

## 2021-10-26 MED ORDER — FLUTICASONE FUROATE-VILANTEROL 200-25 MCG/ACT IN AEPB
1.0000 | INHALATION_SPRAY | Freq: Every day | RESPIRATORY_TRACT | Status: DC
  Administered 2021-10-28 – 2021-10-29 (×2): 1 via RESPIRATORY_TRACT
  Filled 2021-10-26 (×2): qty 28

## 2021-10-26 MED ORDER — SODIUM CHLORIDE 0.9 % IV SOLN
2.0000 g | Freq: Three times a day (TID) | INTRAVENOUS | Status: DC
  Administered 2021-10-26 – 2021-10-27 (×5): 2 g via INTRAVENOUS
  Filled 2021-10-26 (×5): qty 12.5

## 2021-10-26 MED ORDER — THIAMINE MONONITRATE 100 MG PO TABS
100.0000 mg | ORAL_TABLET | Freq: Every day | ORAL | Status: DC
  Administered 2021-10-26 – 2021-10-29 (×3): 100 mg via ORAL
  Filled 2021-10-26 (×3): qty 1

## 2021-10-26 MED ORDER — ENOXAPARIN SODIUM 40 MG/0.4ML IJ SOSY
40.0000 mg | PREFILLED_SYRINGE | INTRAMUSCULAR | Status: DC
  Administered 2021-10-26 – 2021-10-28 (×3): 40 mg via SUBCUTANEOUS
  Filled 2021-10-26 (×3): qty 0.4

## 2021-10-26 MED ORDER — VANCOMYCIN HCL 750 MG/150ML IV SOLN
750.0000 mg | Freq: Two times a day (BID) | INTRAVENOUS | Status: DC
  Administered 2021-10-26 – 2021-10-27 (×2): 750 mg via INTRAVENOUS
  Filled 2021-10-26 (×2): qty 150

## 2021-10-26 MED ORDER — ACETAMINOPHEN 325 MG PO TABS
650.0000 mg | ORAL_TABLET | Freq: Once | ORAL | Status: AC
  Administered 2021-10-26: 650 mg via ORAL
  Filled 2021-10-26: qty 2

## 2021-10-26 MED ORDER — LORAZEPAM 1 MG PO TABS: 1.0000 mg | ORAL_TABLET | ORAL | Status: AC | PRN

## 2021-10-26 MED ORDER — ACETAMINOPHEN 650 MG RE SUPP
650.0000 mg | Freq: Once | RECTAL | Status: DC
  Filled 2021-10-26: qty 1

## 2021-10-26 MED ORDER — FOLIC ACID 1 MG PO TABS
1.0000 mg | ORAL_TABLET | Freq: Every day | ORAL | Status: DC
  Administered 2021-10-26 – 2021-10-29 (×4): 1 mg via ORAL
  Filled 2021-10-26 (×4): qty 1

## 2021-10-26 MED ORDER — SODIUM CHLORIDE 0.9 % IV SOLN
2.0000 g | Freq: Once | INTRAVENOUS | Status: AC
  Administered 2021-10-26: 2 g via INTRAVENOUS
  Filled 2021-10-26: qty 20

## 2021-10-26 MED ORDER — LACTATED RINGERS IV SOLN: INTRAVENOUS | Status: AC

## 2021-10-26 MED ORDER — ALBUTEROL SULFATE (2.5 MG/3ML) 0.083% IN NEBU
10.0000 mg | INHALATION_SOLUTION | Freq: Once | RESPIRATORY_TRACT | Status: AC
  Administered 2021-10-26: 10 mg via RESPIRATORY_TRACT
  Filled 2021-10-26: qty 12

## 2021-10-26 MED ORDER — PANTOPRAZOLE SODIUM 40 MG PO TBEC
40.0000 mg | DELAYED_RELEASE_TABLET | Freq: Every day | ORAL | Status: DC
  Administered 2021-10-26 – 2021-10-29 (×4): 40 mg via ORAL
  Filled 2021-10-26 (×4): qty 1

## 2021-10-26 MED ORDER — LORAZEPAM 2 MG/ML IJ SOLN: 1.0000 mg | INTRAMUSCULAR | Status: AC | PRN

## 2021-10-26 NOTE — Sepsis Progress Note (Signed)
Requested third LA from provider per sepsis protocol.

## 2021-10-26 NOTE — H&P (Addendum)
Date: 10/26/2021               Patient Name:  James [name] MRN: [id number]  DOB: 01/14/1960 Age / Sex: 61 y.o., male   PCP: [name], MD         Medical Service: Internal Medicine Teaching Service         Attending Physician: Dr. 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 Pt was recently admitted to the service with similar complaints, and was found to have bilateral pneumonia leading to acute on chronic respiratory failure. He was treated with cefepime and azithromycin, and was given a daily prednisone for 40mg  for five days total. He states his symptoms today are worse than his symptoms on last admission. Pt was on BiPAP machine during examination. He states after his hospital stay he ran out of medications and went a week without taking them medications. Denies fever, chills, vomiting, or swelling in his legs, chest pain or back pain.   ED Course: In the emergency department, pt was found to be in acute respiratory failure. His white blood cell count was elevated at 33.2, and lactic acid was 2.1. Chest x-ray revealed increased opacity run referral worrisome for a new area of pneumonia, and interval clearing of patchy infiltrates previously in the left mid to lower lung field. Blood cultures were taken and are currently in progress, and was given a dose of Ceftriaxone, Azithromycin, and Vancomycin. He presented tachypneic, and  wheezing, and was also given Duoneb and placed on CPAP.   Meds:  Albuterol nebulizer  Ventolin inhaler  Breztri Aerosphere Clobetasol Lotrisone Lasix 20 mg daily PRN edema Glipizde 5 mg daily Losartan 50 mg daily Metoprolol succinate 25 mg daily Protonix 40 mg dialy Prednisone 10 mg daily Pregabalin 100 mg BID  Past Medical History Acute hypoxic respiratoyr failure 2/2 COPD Incidental pulmonary nodules Tobacco use disorder T2DM HTN Neuropathy  Past Surgical History:  Procedure Laterality Date   LUNG REMOVAL, PARTIAL     VIDEO ASSISTED THORACOSCOPY (VATS)/THOROCOTOMY Left 06/11/2013   Procedure: VIDEO ASSISTED THORACOSCOPY (VATS)/THOROCOTOMY;  Surgeon: Ivin Poot, MD;  Location: [facility];  Service: Thoracic;  Laterality: Left;   VIDEO BRONCHOSCOPY N/A 06/11/2013   Procedure: VIDEO BRONCHOSCOPY;  Surgeon: Ivin Poot, MD;  Location: [place] [facility];  Service: Thoracic;  Laterality: N/A;   VIDEO BRONCHOSCOPY N/A 08/10/2018   Procedure: VIDEO BRONCHOSCOPY WITH FLUORO;  Surgeon: Candee Furbish, MD;  Location: [facility] ENDOSCOPY;  Service: Endoscopy;  Laterality: N/A;   Social:  Lives With: Alone, has a friend that provides transportation Level of Function: Independent in ADLs and IADLs PCP: Dr. Asencion Noble Substances: Alcohol use, unclear amount of drinks/week. Last drink was last Thursday. Chews tobacco currently  Allergies: Allergies as of 10/26/2021   (No Known Allergies)   Review of Systems: A complete ROS was negative except as per HPI.   OBJECTIVE:   Physical Exam: Blood  pressure 132/83, pulse (!) 109, temperature 98.5 F (36.9 C), temperature source Oral, resp. rate (!) 26, SpO2 100 %.  Constitutional: Ill-appearing male, laying in bed on BiPAP HENT: normocephalic atraumatic, mucous membranes moist Eyes: conjunctiva non-erythematous Neck: supple Cardiovascular: Tachycardic, regular rhythm, perfusing well  Pulmonary/Chest: Diffuse end-expiratory wheezing,  tachypneac on Bipap Abdominal: Non-tender. Distended tympanic abdomen, with normal bowel sounds Neuro: Answering questions appropriately Skin: warm and dry  Labs: CBC    Component Value Date/Time   WBC 33.2 (H) 10/26/2021 0340   RBC 4.95 10/26/2021 0340   HGB 14.7 10/26/2021 0340   HGB 12.8 (L) 07/21/2021 1454   HCT 46.7 10/26/2021 0340   HCT 38.8 07/21/2021 1454   PLT 383 10/26/2021 0340   PLT 388 07/21/2021 1454   MCV 94.3 10/26/2021 0340   MCV 90 07/21/2021 1454   MCH 29.7 10/26/2021 0340   MCHC 31.5 10/26/2021 0340   RDW 14.3 10/26/2021 0340   RDW 12.3 07/21/2021 1454   LYMPHSABS 4.6 (H) 10/26/2021 0340   LYMPHSABS 1.5 07/21/2021 1454   MONOABS 1.0 10/26/2021 0340   EOSABS 0.3 10/26/2021 0340   EOSABS 0.1 07/21/2021 1454   BASOSABS 0.0 10/26/2021 0340   BASOSABS 0.1 07/21/2021 1454    CMP     Component Value Date/Time   NA 145 10/26/2021 0447   NA 144 07/21/2021 1454   K 4.4 10/26/2021 0447   CL 105 10/26/2021 0447   CO2 30 10/26/2021 0447   GLUCOSE 118 (H) 10/26/2021 0447   BUN 11 10/26/2021 0447   BUN 15 07/21/2021 1454   CREATININE 1.13 10/26/2021 0447   CREATININE 0.79 10/26/2016 1103   CALCIUM 8.9 10/26/2021 0447   PROT 5.8 (L) 10/26/2021 0447   PROT 6.0 07/21/2021 1454   ALBUMIN 3.6 10/26/2021 0447   ALBUMIN 4.1 07/21/2021 1454   AST 29 10/26/2021 0447   ALT 18 10/26/2021 0447   ALT 62 (H) 06/23/2016 1226   ALKPHOS 42 10/26/2021 0447   BILITOT 0.5 10/26/2021 0447   BILITOT 0.2 07/21/2021 1454   GFRNONAA >60 10/26/2021 0447   GFRAA >60 09/25/2019 0320    Imaging: Chest xray - developing right lower lobe opacity in comparison to prior from 10/11/21  EKG: personally reviewed my interpretation is sinus tachycardia with frequent PAC/PVC. Unchanged from prior.   ASSESSMENT & PLAN:   Assessment & Plan by Problem: Principal Problem:   Acute respiratory failure (HCC)  James [name] is a 61 y.o. person living with a history of nocturnal  oxygen-dependent COPD on chronic prednisone therapy, incidental pulmonary nodules in the setting of extensive former tobacco use T2DM, HTN who presented with shortness of breath and admitted for acute respiratory failure likely infectious on hospital day 0  Acute respiratory distress secondary to bacterial pneumonia Lactic acidemia Patient with multiple hospitalizations for recurrent pneumonia episodes. Most recent 2 weeks ago he was found to have hypoxic/hypercarbic respiratory failure in setting of bacterial pneumonia and copd exacerbation. He required IV abx of azithromycin and cefepime, discharged home on 2 days of Augmentin and increased steroid dose for 5 days. He initially had significant improvement in his symptoms. He presents today requiring bipap for respiratory distress and found to be tachycardic, febrile at 101, and with a leukocytosis concerning for ongoing infectious process. Examination consistent with respiratory distress and expiratory wheezing. Tympanic abdomen likely 2/2 to bipap use. Unclear if hypoxic prior to bipap placement. No other organ involvement, do not suspect sepsis. Chest xray with what appears to be developing  right lower lobe opacity when compared to prior. With his history of alcohol use disorder wondering if he is frequently aspirating. Nodules on CT scan are small, low suspicion for post-obstructive causes. Repeat CT once respiratory status improves with recurrent hospitalizations and severity of presentation.  -continue vancomycin, start cefepime for pseudomonal coverage day 1 -continue Bipap and wean to home supplemental oxygen as able -CT chest with contrast once respiratory status improves -follow up MRSA nasal swab, respiratory viral panels, blood cultures -urine strep pending -daily CBC -tylenol for continued fevers -LR infusion for 20 hours, received 3 bolus of LR in ED -trend lactic acid  Suspected COPD Chronic oxygen therapy No formal PFT's performed,  has followed with pulmonology on outpatient basis. Recent hospitalizations also suspicious for recurrent COPD exacerbations. Minimal expiratory wheezing on examination.  -continue duonebs PRN,  -budesonide-glycopyrrolate-formoterol 2 puffs daily -continue home prednisone 10 mg daily -wean bipap, place on home 2 L supplemental oxygen -follow up pulmonology outpatient for PFT's  Hx of alcohol use disorder and alcohol withdrawal with DT's Patient with long standing history of alcohol use. Was admitted to the ICU on 04/2020 requiring precedex for delirium tremens. Was placed on CIWA with ativan earlier this month during admission, unclear if he required ativan doses. Last alcohol beverage since discharge was last week per patient -with history of DT's and continued alcohol use, start CIWA with ativan -multivitamin, folate, thiamine daily  Lung nodules Chest CT imaging 6/23 with several new pulmonary nodules with 6 mm solid mass in R lung.  -follow up outpatient PCCM -CT imaging as per above  Tobacco use disorder Current smokeless tobacco use, prior cigarette user. Encourage cessation and offer nicotine patch if interested  Type 2 diabetes mellitus Current regimen of glipizide, last A1c of 6.4% 07/13/21 -SSI during admission -resume home glipizide at discharge  GERD Protonix 40 daily  HTN Hold losartan and metoprolol  Diet: NPO VTE: Enoxaparin IVF: None,None Code: Full Prior to Admission Living Arrangement: Home Anticipated Discharge Location: Home Barriers to Discharge: Improvement in respiratory status and treatment of pneumonia  Dispo: Admit patient to Inpatient with expected length of stay greater than 2 midnights.  Signed: Riesa Pope, MD Internal Medicine Resident PGY-3  10/26/2021, 8:35 AM

## 2021-10-26 NOTE — ED Notes (Signed)
Pt calls out saying "I cant breathe". Diaphoretic and distressed. Duoneb being administered.

## 2021-10-26 NOTE — ED Notes (Signed)
Pt ambulatory to restroom for bowel movement on mobil oxygen tank. New linens placed on bed.

## 2021-10-26 NOTE — Telephone Encounter (Signed)
Currently in hospital

## 2021-10-26 NOTE — Progress Notes (Signed)
Pt being followed by [facility] for Sepsis protocol. 

## 2021-10-26 NOTE — Progress Notes (Signed)
Pharmacy Antibiotic Note  James [name] is a 61 y.o. male admitted on 10/26/2021 with pneumonia.  Pharmacy has been consulted for Vancomycin dosing. WBC is elevated. Renal function ok.   Plan: Vancomycin 750 mg IV q12h >>>Estimated AUC: 503 Ceftriaxone/Azithro x 1 in the ED, f/u additional coverage  Trend WBC, temp, renal function  F/U infectious work-up Drug levels as indicated  Temp (24hrs), Avg:99.9 F (37.7 C), Min:98.5 F (36.9 C), Max:101.2 F (38.4 C)  Recent Labs  Lab 10/26/21 0340 10/26/21 0447  WBC 33.2*  --   CREATININE  --  1.13  LATICACIDVEN 2.1*  --     CrCl cannot be calculated (Unknown ideal weight.).    No Known Allergies  Narda Bonds, PharmD, BCPS Clinical Pharmacist Phone: [phone]

## 2021-10-26 NOTE — ED Notes (Signed)
RT at bedside.

## 2021-10-26 NOTE — ED Provider Notes (Signed)
[facility] EMERGENCY DEPARTMENT Provider Note   CSN: [id number] Arrival date & time: 10/26/21  0334     History  Chief Complaint  Patient presents with   Shortness of Breath  Level 5 caveat due to acuity of condition  James [name] is a 61 y.o. male.  The history is provided by the EMS personnel and the patient. The history is limited by the condition of the patient.   Patient w/history of COPD presents in respiratory distress.  It is reported the patient had increasing shortness of breath in the past day.  EMS reports patient was tachypneic, wheezing on their arrival.  Patient was given epinephrine, Solu-Medrol, magnesium, DuoNeb and placed on CPAP.  No other details known on arrival    Home Medications Prior to Admission medications   Medication Sig Start Date End Date Taking? Authorizing Provider  albuterol (PROVENTIL) (2.5 MG/3ML) 0.083% nebulizer solution Take 3 mLs (2.5 mg total) by nebulization every 6 (six) hours as needed for wheezing or shortness of breath. 05/12/21 05/12/22  [name], MD  albuterol (VENTOLIN HFA) 108 (90 Base) MCG/ACT inhaler Inhale 2 puffs into the lungs every 4 (four) hours as needed. 09/09/21   [name], MD  Budeson-Glycopyrrol-Formoterol (BREZTRI AEROSPHERE) 160-9-4.8 MCG/ACT AERO Inhale 2 puffs into the lungs in the morning and at bedtime. 07/28/21   Icard, Octavio Graves, DO  Budeson-Glycopyrrol-Formoterol (BREZTRI AEROSPHERE) 160-9-4.8 MCG/ACT AERO Inhale 2 puffs into the lungs in the morning and at bedtime. 10/14/21   Maryjane Hurter, MD  clobetasol cream (TEMOVATE) 6.96 % Apply 1 Application topically 2 (two) times daily. 09/09/21   [name], MD  clotrimazole-betamethasone (LOTRISONE) cream Apply 1 Application topically 2 (two) times daily to groin affected areas 09/09/21   [name], MD  furosemide (LASIX) 20 MG tablet Take 1 tablet (20 mg total) by mouth daily as needed for edema. Patient not taking:  Reported on 10/14/2021 08/11/21 08/11/22  [name], MD  glipiZIDE (GLUCOTROL XL) 5 MG 24 hr tablet Take 1 tablet (5 mg total) by mouth daily with breakfast. Patient not taking: Reported on 10/14/2021 06/29/21   [name], MD  losartan (COZAAR) 50 MG tablet Take 1 tablet (50 mg total) by mouth daily. Patient not taking: Reported on 10/14/2021 06/29/21   [name], MD  metoprolol succinate (TOPROL-XL) 25 MG 24 hr tablet Take 1 tablet (25 mg total) by mouth daily. Patient not taking: Reported on 10/14/2021 06/29/21 06/29/22  [name], MD  pantoprazole (PROTONIX) 40 MG tablet Take 1 tablet (40 mg total) by mouth daily. Patient not taking: Reported on 10/14/2021 06/29/21   [name], MD  predniSONE (DELTASONE) 10 MG tablet Take 1 tablet (10 mg total) by mouth daily with breakfast. Patient not taking: Reported on 10/14/2021 07/21/21   [name], MD  pregabalin (LYRICA) 100 MG capsule Take 1 capsule (100 mg total) by mouth 2 (two) times daily. 10/14/21   [name], MD  Vitamin D3 (VITAMIN D) 25 MCG tablet Take 1 tablet by mouth daily. Patient not taking: Reported on 10/14/2021 07/14/21   [name], MD      Allergies    Patient has no known allergies.    Review of Systems   Review of Systems  Unable to perform ROS: Acuity of condition    Physical Exam Updated Vital Signs BP (!) 143/96   Pulse (!) 132   Temp (!) 101.2 F (38.4  C) (Axillary)   Resp (!) 31   SpO2 100%  Physical Exam CONSTITUTIONAL: Ill-appearing, distress noted HEAD: Normocephalic/atraumatic EYES: EOMI/PERRL ENMT: Tobacco noted around his mouth, no stridor NECK: supple no meningeal signs CV: S1/S2 noted, tachycardic LUNGS: wheezing bilaterally, distress noted ABDOMEN: soft, nontender NEURO: Pt is awake/alert/appropriate, moves all extremitiesx4.  No facial droop.   EXTREMITIES: pulses normal/equal, full ROM, minimal lower extremity edema SKIN: warm, color  normal PSYCH: Anxious  ED Results / Procedures / Treatments   Labs (all labs ordered are listed, but only abnormal results are displayed) Labs Reviewed  LACTIC ACID, PLASMA - Abnormal; Notable for the following components:      Result Value   Lactic Acid, Venous 2.1 (*)    All other components within normal limits  CBC WITH DIFFERENTIAL/PLATELET - Abnormal; Notable for the following components:   WBC 33.2 (*)    Neutro Abs 27.2 (*)    Lymphs Abs 4.6 (*)    All other components within normal limits  COMPREHENSIVE METABOLIC PANEL - Abnormal; Notable for the following components:   Glucose, Bld 118 (*)    Total Protein 5.8 (*)    All other components within normal limits  CULTURE, BLOOD (ROUTINE X 2)  CULTURE, BLOOD (ROUTINE X 2)  BRAIN NATRIURETIC PEPTIDE  LACTIC ACID, PLASMA    EKG EKG Interpretation  Date/Time:  Monday October 26 2021 03:45:12 EDT Ventricular Rate:  128 PR Interval:  165 QRS Duration: 101 QT Interval:  312 QTC Calculation: 443 R Axis:   68 Text Interpretation: Sinus tachycardia Atrial premature complexes Probable anteroseptal infarct, old Interpretation limited secondary to artifact Confirmed by Ripley Fraise ([id number]) on 10/26/2021 4:05:34 AM  Radiology DG Chest Port 1 View  Result Date: 10/26/2021 CLINICAL DATA:  Questionable sepsis. EXAM: PORTABLE CHEST 1 VIEW COMPARISON:  Portable chest 10/11/2021 FINDINGS: There is increased right infrahilar opacity concerning for a new area of pneumonia. Patchy infiltrates previously in the left mid to lower lung field are no longer seen. Remaining lungs are clear with emphysematous changes. There is chronic left lateral sulcal blunting. Heart size and vasculature are normal with stable mediastinum and aortic atherosclerotic spread There is osteopenia and multiple chronic healed fracture deformities of the ribcage. No pneumothorax. IMPRESSION: Increased opacity run referral worrisome for a new area of pneumonia.  Interval clearing of patchy infiltrates previously in the left mid to lower lung field. Electronically Signed   By: Telford Nab M.D.   On: 10/26/2021 04:20    Procedures .Critical Care  Performed by: Ripley Fraise, MD Authorized by: Ripley Fraise, MD   Critical care provider statement:    Critical care time (minutes):  80   Critical care start time:  10/26/2021 4:30 AM   Critical care end time:  10/26/2021 5:50 AM   Critical care time was exclusive of:  Separately billable procedures and treating other patients   Critical care was necessary to treat or prevent imminent or life-threatening deterioration of the following conditions:  Respiratory failure and sepsis   Critical care was time spent personally by me on the following activities:  Examination of patient, evaluation of patient's response to treatment, development of treatment plan with patient or surrogate, discussions with consultants, pulse oximetry, ordering and review of radiographic studies, ordering and review of laboratory studies, re-evaluation of patient's condition, ordering and performing treatments and interventions and review of old charts   I assumed direction of critical care for this patient from another provider in my specialty: no  Care discussed with: admitting provider       Medications Ordered in ED Medications  lactated ringers infusion ( Intravenous New Bag/Given 10/26/21 0438)  vancomycin (VANCOCIN) IVPB 1000 mg/200 mL premix (1,000 mg Intravenous New Bag/Given 10/26/21 0507)  enoxaparin (LOVENOX) injection 40 mg (has no administration in time range)  thiamine (VITAMIN B1) tablet 100 mg (has no administration in time range)    Or  thiamine (VITAMIN B1) injection 100 mg (has no administration in time range)  folic acid (FOLVITE) tablet 1 mg (has no administration in time range)  multivitamin with minerals tablet 1 tablet (has no administration in time range)  albuterol (PROVENTIL) (2.5 MG/3ML)  0.083% nebulizer solution 10 mg (10 mg Nebulization Given 10/26/21 0350)  lactated ringers bolus 1,000 mL (1,000 mLs Intravenous New Bag/Given 10/26/21 0438)    And  lactated ringers bolus 1,000 mL (1,000 mLs Intravenous New Bag/Given 10/26/21 0440)    And  lactated ringers bolus 1,000 mL (1,000 mLs Intravenous New Bag/Given 10/26/21 0505)  cefTRIAXone (ROCEPHIN) 2 g in sodium chloride 0.9 % 100 mL IVPB (0 g Intravenous Stopped 10/26/21 0508)  azithromycin (ZITHROMAX) 500 mg in sodium chloride 0.9 % 250 mL IVPB (0 mg Intravenous Stopped 10/26/21 0552)  acetaminophen (TYLENOL) tablet 650 mg (650 mg Oral Given 10/26/21 0511)    ED Course/ Medical Decision Making/ A&P Clinical Course as of 10/26/21 0601  Mon Oct 26, 2021  0422 WBC(!): 33.2 Leukocytosis [DW]  0459 Patient is appearing improved.  This is very similar to his previous ED visit.  We will call for admission [DW]  0601 Discussed with internal medicine residency for admission.  Patient is improving. [DW]    Clinical Course User Index [DW] Ripley Fraise, MD                           Medical Decision Making Amount and/or Complexity of Data Reviewed Labs: ordered. Decision-making details documented in ED Course. Radiology: ordered. ECG/medicine tests: ordered.  Risk OTC drugs. Prescription drug management. Decision regarding hospitalization.   This patient presents to the ED for concern of shortness of breath, this involves an extensive number of treatment options, and is a complaint that carries with it a high risk of complications and morbidity.  The differential diagnosis includes but is not limited to Acute coronary syndrome, pneumonia, acute pulmonary edema, pneumothorax, acute anemia, pulmonary embolism    Comorbidities that complicate the patient evaluation: Patient's presentation is complicated by their history of COPD  Social Determinants of Health: Patient's  tobacco use   increases the complexity of managing  their presentation  Additional history obtained: Additional history obtained from EMS discussed with paramedics at the bedside Records reviewed previous admission documents  Lab Tests: I Ordered, and personally interpreted labs.  The pertinent results include: leukocytosis  Imaging Studies ordered: I ordered imaging studies including chest x-ray I independently visualized and interpreted imaging which showed pneumonia I agree with the radiologist interpretation  Cardiac Monitoring: The patient was maintained on a cardiac monitor.  I personally viewed and interpreted the cardiac monitor which showed an underlying rhythm of:  sinus tachycardia  Medicines ordered and prescription drug management: I ordered medication including IV fluids and antibiotics for pneumonia Reevaluation of the patient after these medicines showed that the patient    improved   Critical Interventions:  Noninvasive ventilation  Consultations Obtained: I requested consultation with the admitting physician IM resident , and discussed  findings as well as  pertinent plan - they recommend: admit  Reevaluation: After the interventions noted above, I reevaluated the patient and found that they have :improved  Complexity of problems addressed: Patient's presentation is most consistent with  acute presentation with potential threat to life or bodily function  Disposition: After consideration of the diagnostic results and the patient's response to treatment,  I feel that the patent would benefit from admission   .   Patient presents in respiratory distress due to recurrent pneumonia and respiratory failure.  He improved with noninvasive ventilation, code sepsis was called and IV fluids and antibiotics were initiated.  Patient was stabilized in the emergency department.        Final Clinical Impression(s) / ED Diagnoses Final diagnoses:  Acute respiratory failure with hypoxia ([place])  Community acquired  pneumonia, unspecified laterality    Rx / DC Orders ED Discharge Orders     None         Ripley Fraise, MD 10/26/21 [phone]

## 2021-10-26 NOTE — Progress Notes (Signed)
RN called RT to trial pt off BIPAP.  RT removed BIPAP at this time and placed pt on 3L . Pt tolerating settings well. BIPAP at bedside on standby if needed. RT will monitor as needed.

## 2021-10-26 NOTE — Progress Notes (Signed)
HD#0 Subjective:   Summary: 61 year old person living with COPD, tobacco use disorder, alcohol use disorder, and diabetes who presented with tachypnea and wheezing and was admitted for management of acute on chronic hypoxic and hypercarbic respiratory failure due to COPD and pneumonia.   Overnight Events: Patient found to be in acute respiratory failure with leukocytosis and lactic acid of 2.1.  Chest x-ray with right right lung opacity.  Patient started on ceftriaxone, azithromycin, and vancomycin.  He was placed on DuoNebs and CPAP, eventually escalating to BiPAP.  On rounds this morning, patient interview made difficult by BiPAP use. Patient was amenable to de-escalating BiPAP to [place]. Asked for some water for dry mouth/throat.  Objective:  Vital signs in last 24 hours: Vitals:   10/26/21 0914 10/26/21 1030 10/26/21 1200 10/26/21 1313  BP:  128/71 (!) 155/87   Pulse:  69 81   Resp:  (!) 21 20   Temp: 98.3 F (36.8 C)   98.5 F (36.9 C)  TempSrc: Oral   Oral  SpO2:  99% 99%    Supplemental O2: Nasal Cannula SpO2: 99 % O2 Flow Rate (L/min): 3 L/min FiO2 (%): (S) 40 % (reduced d/t COPD hx)   Physical Exam:  Constitutional: ill-appearing male, sitting in hospital bed on BiPAP HENT: normocephalic atraumatic, mucous membranes moist  Eyes: conjunctiva non-erythematous  Pulmonary/Chest: tachypnea, lungs clear to auscultation bilaterally, no wheezing Abd: Soft, nontender, distended, tympanic on percussion Skin: Warm and dry  Pertinent Labs:    Latest Ref Rng & Units 10/26/2021    3:40 AM 10/13/2021   12:48 AM 10/12/2021    5:41 AM  CBC  WBC 4.0 - 10.5 K/uL 33.2  14.1  18.8   Hemoglobin 13.0 - 17.0 g/dL 14.7  11.7  11.4   Hematocrit 39.0 - 52.0 % 46.7  35.5  36.2   Platelets 150 - 400 K/uL 383  257  241        Latest Ref Rng & Units 10/26/2021    4:47 AM 10/12/2021    5:41 AM 10/11/2021   10:58 AM  CMP  Glucose 70 - 99 mg/dL 118  124    BUN 6 - 20 mg/dL 11  17     Creatinine 0.61 - 1.24 mg/dL 1.13  0.87    Sodium 135 - 145 mmol/L 145  144  143   Potassium 3.5 - 5.1 mmol/L 4.4  4.0  4.3   Chloride 98 - 111 mmol/L 105  106    CO2 22 - 32 mmol/L 30  29    Calcium 8.9 - 10.3 mg/dL 8.9  9.5    Total Protein 6.5 - 8.1 g/dL 5.8     Total Bilirubin 0.3 - 1.2 mg/dL 0.5     Alkaline Phos 38 - 126 U/L 42     AST 15 - 41 U/L 29     ALT 0 - 44 U/L 18       Imaging: DG Chest Port 1 View  Result Date: 10/26/2021 CLINICAL DATA:  Questionable sepsis. EXAM: PORTABLE CHEST 1 VIEW COMPARISON:  Portable chest 10/11/2021 FINDINGS: There is increased right infrahilar opacity concerning for a new area of pneumonia. Patchy infiltrates previously in the left mid to lower lung field are no longer seen. Remaining lungs are clear with emphysematous changes. There is chronic left lateral sulcal blunting. Heart size and vasculature are normal with stable mediastinum and aortic atherosclerotic spread There is osteopenia and multiple chronic healed fracture deformities of the ribcage.  No pneumothorax. IMPRESSION: Increased opacity run referral worrisome for a new area of pneumonia. Interval clearing of patchy infiltrates previously in the left mid to lower lung field. Electronically Signed   By: Telford Nab M.D.   On: 10/26/2021 04:20    Assessment/Plan:   Principal Problem:   COPD with pneumonia ([place]) Active Problems:   Acute on chronic respiratory failure with hypoxia and hypercapnia (HCC)   Tobacco use disorder   Alcohol use disorder   Type 2 diabetes mellitus without complication, without long-term current use of insulin ([place])   Patient Summary: James [name] is a 61 y.o. person living with a history of nocturnal oxygen-dependent COPD on chronic prednisone therapy, incidental pulmonary nodules in the setting of extensive former tobacco use T2DM, HTN who presented with shortness of breath and admitted for acute respiratory failure likely infectious.   Acute on  chronic hypoxic and hypercarbic respiratory failure 2/2 COPD and pneumonia Lactic acidemia, resolving Recently discharged <2 weeks ago after admission for bilateral pneumonia, sent home on course of prednisone taper and Augmentin.  CXR today with opacity at right lower lobe though image of poor quality.  Labs with high neutrophil predominant leukocytosis.  Lactic acidemia improving.  Patient transitioned off BiPAP this morning to 3 L nasal cannula and has been sating well. Currently afebrile.  Given patient's history of AUD, possibility for aspiration pneumonia. -Continue vancomycin and cefepime -Supplement oxygen with goal sats > 92% -Prednisone 40 mg daily with slow taper -DuoNebs every 4 hours as needed -Breo Ellipta daily -Incruse Ellipta daily -Albuterol -Repeat chest CT once stable   Suspected COPD Chronic oxygen therapy No PFTs on file. Patient is followed by pulmonology outpatient.  Has had multiple hospitalizations with COPD exacerbations.  Wheezing initially at presentation but this seems to have resolved with nebs.  On 2 L O2 at baseline. -Prednisone 40 mg daily with slow taper -DuoNebs every 4 hours as needed -Breo Ellipta daily -Incruse Ellipta daily -Albuterol -Needs follow-up with pulmonology outpatient for PFTs   Hx of alcohol use disorder and alcohol withdrawal with DT's Long history of alcohol use. DT during previous hospitalization requiring precedex. No signs of withdrawal at this point. Will continue to monitor for symptoms. -CIWA protocol with ativan -multivitamin, folate, thiamine daily   Lung nodules Tobacco use disorder Prior cigarette user continues with smokeless tobacco.  CT chest 6/23 with several new pulmonary nodules including 6 mm solid mass in right lung. -Repeat CT once stable -Outpatient follow-up with pulmonology   Type 2 diabetes mellitus Last A1c 6.4% and July 2023.  Currently on glipizide at home.  Glucose relatively well controlled but patient  NPO.  Will continue to monitor as diet advances. -SSI  GERD Protonix 40 daily   HTN Hold losartan and metoprolol   Diet: NPO VTE: Enoxaparin IVF: None,None Code: Full Prior to Admission Living Arrangement: Home Anticipated Discharge Location: Home Barriers to Discharge: Improvement in respiratory status and treatment of pneumonia   Dispo: Admit patient to Inpatient with expected length of stay greater than 2 midnights  Linward Natal MD Internal Medicine Resident PGY-1 Please contact the on call pager after 5 pm and on weekends at [phone].

## 2021-10-26 NOTE — ED Triage Notes (Signed)
Pt to ED via EMS from home. Pt c/o progressively worsening SOB x1 day. Decreased lung sounds bilaterally with EMS worse on right side. Pt given duo neb, .15 of epi, 125 solumedrol, 2g of mag with EMS en route. Pt denies CP.    EMS Vitals: 70s HR 30-35 RR End tidal 33 84% RA 94% CPAP 190/106 20 RAC

## 2021-10-27 ENCOUNTER — Encounter (HOSPITAL_COMMUNITY): Payer: Self-pay | Admitting: Student in an Organized Health Care Education/Training Program

## 2021-10-27 ENCOUNTER — Other Ambulatory Visit: Payer: Self-pay

## 2021-10-27 DIAGNOSIS — J440 Chronic obstructive pulmonary disease with acute lower respiratory infection: Secondary | ICD-10-CM | POA: Diagnosis not present

## 2021-10-27 DIAGNOSIS — J9601 Acute respiratory failure with hypoxia: Secondary | ICD-10-CM

## 2021-10-27 DIAGNOSIS — J189 Pneumonia, unspecified organism: Secondary | ICD-10-CM | POA: Diagnosis not present

## 2021-10-27 DIAGNOSIS — J9602 Acute respiratory failure with hypercapnia: Secondary | ICD-10-CM | POA: Diagnosis not present

## 2021-10-27 LAB — COMPREHENSIVE METABOLIC PANEL
ALT: 18 U/L (ref 0–44)
AST: 16 U/L (ref 15–41)
Albumin: 3 g/dL — ABNORMAL LOW (ref 3.5–5.0)
Alkaline Phosphatase: 32 U/L — ABNORMAL LOW (ref 38–126)
Anion gap: 11 (ref 5–15)
BUN: 15 mg/dL (ref 6–20)
CO2: 29 mmol/L (ref 22–32)
Calcium: 9.3 mg/dL (ref 8.9–10.3)
Chloride: 103 mmol/L (ref 98–111)
Creatinine, Ser: 0.73 mg/dL (ref 0.61–1.24)
GFR, Estimated: >60 mL/min (ref >60)
Glucose, Bld: 134 mg/dL — ABNORMAL HIGH (ref 70–99)
Potassium: 4.5 mmol/L (ref 3.5–5.1)
Sodium: 143 mmol/L (ref 135–145)
Total Bilirubin: 0.6 mg/dL (ref 0.3–1.2)
Total Protein: 5.2 g/dL — ABNORMAL LOW (ref 6.5–8.1)

## 2021-10-27 LAB — CBC
HCT: 34.2 % — ABNORMAL LOW (ref 39.0–52.0)
Hemoglobin: 10.9 g/dL — ABNORMAL LOW (ref 13.0–17.0)
MCH: 29.8 pg (ref 26.0–34.0)
MCHC: 31.9 g/dL (ref 30.0–36.0)
MCV: 93.4 fL (ref 80.0–100.0)
Platelets: 231 10*3/uL (ref 150–400)
RBC: 3.66 MIL/uL — ABNORMAL LOW (ref 4.22–5.81)
RDW: 14.5 % (ref 11.5–15.5)
WBC: 17.5 10*3/uL — ABNORMAL HIGH (ref 4.0–10.5)
nRBC: 0 % (ref 0.0–0.2)

## 2021-10-27 LAB — GLUCOSE, CAPILLARY
Glucose-Capillary: 166 mg/dL — ABNORMAL HIGH (ref 70–99)
Glucose-Capillary: 161 mg/dL — ABNORMAL HIGH (ref 70–99)
Glucose-Capillary: 118 mg/dL — ABNORMAL HIGH (ref 70–99)

## 2021-10-27 LAB — CBG MONITORING, ED: Glucose-Capillary: 106 mg/dL — ABNORMAL HIGH (ref 70–99)

## 2021-10-27 LAB — MRSA NEXT GEN BY PCR, NASAL: MRSA by PCR Next Gen: NOT DETECTED

## 2021-10-27 MED ORDER — MELATONIN 3 MG PO TABS
3.0000 mg | ORAL_TABLET | Freq: Every day | ORAL | Status: DC
  Administered 2021-10-27 – 2021-10-28 (×2): 3 mg via ORAL
  Filled 2021-10-27 (×2): qty 1

## 2021-10-27 MED ORDER — SODIUM CHLORIDE 0.9 % IV SOLN
3.0000 g | Freq: Four times a day (QID) | INTRAVENOUS | Status: DC
  Administered 2021-10-27 – 2021-10-29 (×8): 3 g via INTRAVENOUS
  Filled 2021-10-27 (×8): qty 8

## 2021-10-27 MED ORDER — VANCOMYCIN HCL IN DEXTROSE 1-5 GM/200ML-% IV SOLN
1000.0000 mg | Freq: Two times a day (BID) | INTRAVENOUS | Status: DC
  Administered 2021-10-27 – 2021-10-28 (×2): 1000 mg via INTRAVENOUS
  Filled 2021-10-27 (×2): qty 200

## 2021-10-27 NOTE — Progress Notes (Signed)
HD#1 Subjective:   Summary: 61 year old person living with COPD, tobacco use disorder, alcohol use disorder, and diabetes who presented with tachypnea and wheezing and was admitted for management of acute on chronic hypoxic and hypercarbic respiratory failure due to COPD and pneumonia.   Overnight Events: NAEO.  Patient states he feels better today.  He states that he knew he had a pneumonia based on the way he felt.  He feels like his breathing is much improved.  States that he had 1 night last week where he drank 2 24 ounce beers.  Objective:  Vital signs in last 24 hours: Vitals:   10/27/21 0735 10/27/21 0745 10/27/21 0845 10/27/21 0931  BP:  (!) 144/81 130/71 107/64  Pulse:  85 81   Resp:  16 17 18   Temp: 98.2 F (36.8 C)   98.4 F (36.9 C)  TempSrc: Oral   Oral  SpO2:  100% 99% 99%  Weight:    94.9 kg  Height:    5\' 9"  (1.753 m)   Supplemental O2: Nasal Cannula SpO2: 99 % O2 Flow Rate (L/min): 3 L/min FiO2 (%): (S) 40 % (reduced d/t COPD hx)   Physical Exam:  Constitutional: Mildly ill-appearing male, sitting in hospital bed, 2 L O2 via [place] HENT: normocephalic atraumatic Eyes: conjunctiva non-erythematous  Pulmonary/Chest: lungs clear to auscultation bilaterally, no wheezing Skin: Warm and dry Psych: Normal mood and affect  Pertinent Labs:    Latest Ref Rng & Units 10/27/2021    9:58 AM 10/26/2021    3:40 AM 10/13/2021   12:48 AM  CBC  WBC 4.0 - 10.5 K/uL 17.5  33.2  14.1   Hemoglobin 13.0 - 17.0 g/dL 10.9  14.7  11.7   Hematocrit 39.0 - 52.0 % 34.2  46.7  35.5   Platelets 150 - 400 K/uL 231  383  257        Latest Ref Rng & Units 10/27/2021    9:58 AM 10/26/2021    4:47 AM 10/12/2021    5:41 AM  CMP  Glucose 70 - 99 mg/dL 134  118  124   BUN 6 - 20 mg/dL 15  11  17    Creatinine 0.61 - 1.24 mg/dL 0.73  1.13  0.87   Sodium 135 - 145 mmol/L 143  145  144   Potassium 3.5 - 5.1 mmol/L 4.5  4.4  4.0   Chloride 98 - 111 mmol/L 103  105  106   CO2 22 -  32 mmol/L 29  30  29    Calcium 8.9 - 10.3 mg/dL 9.3  8.9  9.5   Total Protein 6.5 - 8.1 g/dL 5.2  5.8    Total Bilirubin 0.3 - 1.2 mg/dL 0.6  0.5    Alkaline Phos 38 - 126 U/L 32  42    AST 15 - 41 U/L 16  29    ALT 0 - 44 U/L 18  18      Imaging: No results found.  Assessment/Plan:   Principal Problem:   COPD with pneumonia ([place]) Active Problems:   Acute on chronic respiratory failure with hypoxia and hypercapnia (HCC)   Tobacco use disorder   Alcohol use disorder   Type 2 diabetes mellitus without complication, without long-term current use of insulin ([place])   Patient Summary: James [name] is a 61 y.o. person living with a history of nocturnal oxygen-dependent COPD on chronic prednisone therapy, incidental pulmonary nodules in the setting of extensive former tobacco use  T2DM, HTN who presented with shortness of breath and admitted for acute respiratory failure likely infectious.   Acute on chronic hypoxic and hypercarbic respiratory failure 2/2 COPD and pneumonia Lactic acidemia, resolving Afebrile. Leukocytosis much improved today.  Approaching baseline oxygenation, now on 3 L by nasal cannula.  Baseline is 2 L.  Likely excessive alcohol use as recently as last week raises suspicion for aspiration pneumonia.  Will consider transitioning to oral antibiotics tomorrow if continues to improve clinically. -Continue vancomycin and cefepime -Supplement oxygen with goal sats > 92% -Prednisone 40 mg daily with slow taper -DuoNebs every 4 hours as needed -Breo Ellipta daily -Incruse Ellipta daily -Albuterol -Repeat chest CT once stable   Suspected COPD Chronic oxygen therapy No PFTs on file. Patient is followed by pulmonology outpatient.  Has had multiple hospitalizations with COPD exacerbations.  Without wheezing on exam today and approaching baseline oxygenation. -Prednisone 40 mg daily with slow taper -DuoNebs every 4 hours as needed -Breo Ellipta daily -Incruse Ellipta  daily -Albuterol -Needs follow-up with pulmonology outpatient for PFTs   Hx of alcohol use disorder and alcohol withdrawal with DT's Long history of alcohol use. DT during previous hospitalization requiring precedex. No signs of withdrawal at this point. Will continue to monitor for symptoms.  Patient did report excessive alcohol use as recently as last week.  Discussed with daughter over the phone, who relates that he had quit drinking for quite some time but recently started back again.  She is unsure of any psychological or social stressors causing relapse. -CIWA protocol with ativan -multivitamin, folate, thiamine daily   Lung nodules Tobacco use disorder Prior cigarette user continues with smokeless tobacco.  CT chest 6/23 with several new pulmonary nodules including 6 mm solid mass in right lung. -Repeat CT once stable -Outpatient follow-up with pulmonology   Type 2 diabetes mellitus Last A1c 6.4% and July 2023.  Currently on glipizide at home.  Glucose relatively well controlled today despite prednisone.  Started on diet.  We will continue to monitor and adjust regimen as needed. -SSI  GERD Protonix 40 daily   HTN Hold losartan and metoprolol   Diet: NPO VTE: Enoxaparin IVF: None,None Code: Full Prior to Admission Living Arrangement: Home Anticipated Discharge Location: Home Barriers to Discharge: Improvement in respiratory status and treatment of pneumonia   Dispo: Admit patient to Inpatient with expected length of stay greater than 2 midnights  Linward Natal MD Internal Medicine Resident PGY-1 Please contact the on call pager after 5 pm and on weekends at [phone].

## 2021-10-27 NOTE — ED Notes (Signed)
PATIENT USES A  P[facility]

## 2021-10-27 NOTE — Progress Notes (Signed)
Pharmacy Antibiotic Note  James [name] is a 61 y.o. male admitted on 10/26/2021 with aspiration pneumonia.  Pharmacy has been consulted for Unasyn dosing.  Plan: Unasyn 3g IV q6 hours D/c cefepime Continue vancomycin 1000mg  IV q12 hours F/u MRSA PCR to de-escalate antibiotics further  Height: 5\' 9"  (175.3 cm) Weight: 94.9 kg (209 lb 3.5 oz) IBW/kg (Calculated) : 70.7  Temp (24hrs), Avg:98.4 F (36.9 C), Min:97.9 F (36.6 C), Max:98.9 F (37.2 C)  Recent Labs  Lab 10/26/21 0340 10/26/21 0447 10/26/21 0600 10/26/21 1040 10/26/21 1150 10/27/21 0958  WBC 33.2*  --   --   --   --  17.5*  CREATININE  --  1.13  --   --   --  0.73  LATICACIDVEN 2.1*  --  3.3* 2.6* 2.3*  --     Estimated Creatinine Clearance: 111.7 mL/min (by C-G formula based on SCr of 0.73 mg/dL).    No Known Allergies  Antimicrobials this admission: Vancomycin 10/23 >>  Cefepime 10/23 >> 10/24 Unasyn 10/24 >>   Dose adjustments this admission:  Microbiology results: 10/23 BCx: ngtd 10/23 Resp panel: negative 10/24 MRSA PCR: ordered  Thank you for allowing pharmacy to be a part of this patient's care.  Dimple Nanas, PharmD, BCPS 10/27/2021 4:46 PM

## 2021-10-28 ENCOUNTER — Inpatient Hospital Stay (HOSPITAL_COMMUNITY): Payer: Medicaid Other

## 2021-10-28 DIAGNOSIS — J690 Pneumonitis due to inhalation of food and vomit: Secondary | ICD-10-CM

## 2021-10-28 DIAGNOSIS — J440 Chronic obstructive pulmonary disease with acute lower respiratory infection: Secondary | ICD-10-CM | POA: Diagnosis not present

## 2021-10-28 DIAGNOSIS — Z87891 Personal history of nicotine dependence: Secondary | ICD-10-CM | POA: Diagnosis not present

## 2021-10-28 LAB — CBC
HCT: 35.8 % — ABNORMAL LOW (ref 39.0–52.0)
Hemoglobin: 11.3 g/dL — ABNORMAL LOW (ref 13.0–17.0)
MCH: 29.5 pg (ref 26.0–34.0)
MCHC: 31.6 g/dL (ref 30.0–36.0)
MCV: 93.5 fL (ref 80.0–100.0)
Platelets: 241 10*3/uL (ref 150–400)
RBC: 3.83 MIL/uL — ABNORMAL LOW (ref 4.22–5.81)
RDW: 14.2 % (ref 11.5–15.5)
WBC: 12.6 10*3/uL — ABNORMAL HIGH (ref 4.0–10.5)
nRBC: 0 % (ref 0.0–0.2)

## 2021-10-28 LAB — GLUCOSE, CAPILLARY
Glucose-Capillary: 104 mg/dL — ABNORMAL HIGH (ref 70–99)
Glucose-Capillary: 176 mg/dL — ABNORMAL HIGH (ref 70–99)
Glucose-Capillary: 197 mg/dL — ABNORMAL HIGH (ref 70–99)
Glucose-Capillary: 225 mg/dL — ABNORMAL HIGH (ref 70–99)

## 2021-10-28 LAB — BASIC METABOLIC PANEL
Anion gap: 8 (ref 5–15)
BUN: 16 mg/dL (ref 6–20)
CO2: 31 mmol/L (ref 22–32)
Calcium: 9.1 mg/dL (ref 8.9–10.3)
Chloride: 104 mmol/L (ref 98–111)
Creatinine, Ser: 0.79 mg/dL (ref 0.61–1.24)
GFR, Estimated: >60 mL/min (ref >60)
Glucose, Bld: 107 mg/dL — ABNORMAL HIGH (ref 70–99)
Potassium: 3.8 mmol/L (ref 3.5–5.1)
Sodium: 143 mmol/L (ref 135–145)

## 2021-10-28 MED ORDER — NICOTINE 21 MG/24HR TD PT24
21.0000 mg | MEDICATED_PATCH | Freq: Every day | TRANSDERMAL | Status: DC
  Administered 2021-10-28 – 2021-10-29 (×2): 21 mg via TRANSDERMAL
  Filled 2021-10-28 (×2): qty 1

## 2021-10-28 NOTE — Telephone Encounter (Signed)
I sent a message to Ronaldo Miyamoto, RN CM  inquiring if they can do the walk test while the patient is hospitalized to re-qualify him for O2.    I spoke to [facility] and he said they can pull the walk test results  from Epic when its completed.

## 2021-10-28 NOTE — Progress Notes (Addendum)
HD#2 Subjective:   Summary: 61 year old person living with COPD, tobacco use disorder, alcohol use disorder, and diabetes who presented with tachypnea and wheezing and was admitted for management of acute on chronic hypoxic and hypercarbic respiratory failure due to COPD and pneumonia.   Overnight Events: NAEO.  Patient continues to endorse improvement in his symptoms.  Endorses a few times where he felt like food was getting stuck in his throat recently.  Denies any difficulty breathing currently.  Objective:  Vital signs in last 24 hours: Vitals:   10/27/21 2320 10/28/21 0314 10/28/21 0756 10/28/21 1220  BP: (!) 142/88 114/70 (!) 144/80 (!) 156/82  Pulse: 78 81 70 71  Resp: 20 (!) 21 20 17   Temp: 98 F (36.7 C) 98.1 F (36.7 C) 97.8 F (36.6 C) 98.6 F (37 C)  TempSrc: Oral Oral Oral Oral  SpO2: 95% 96% 100% 94%  Weight:      Height:       Supplemental O2: Nasal Cannula SpO2: 94 % O2 Flow Rate (L/min): 3 L/min FiO2 (%): (S) 40 % (reduced d/t COPD hx)   Physical Exam:  Constitutional: well-appearing male, sitting in hospital bed, 2 L O2 via [place] HENT: normocephalic atraumatic Eyes: conjunctiva non-erythematous  Pulmonary/Chest: lungs clear to auscultation bilaterally, mild end expiratory wheezing Skin: Warm and dry Psych: Normal mood and affect  Pertinent Labs:    Latest Ref Rng & Units 10/28/2021    7:48 AM 10/27/2021    9:58 AM 10/26/2021    3:40 AM  CBC  WBC 4.0 - 10.5 K/uL 12.6  17.5  33.2   Hemoglobin 13.0 - 17.0 g/dL 11.3  10.9  14.7   Hematocrit 39.0 - 52.0 % 35.8  34.2  46.7   Platelets 150 - 400 K/uL 241  231  383        Latest Ref Rng & Units 10/28/2021    7:48 AM 10/27/2021    9:58 AM 10/26/2021    4:47 AM  CMP  Glucose 70 - 99 mg/dL 107  134  118   BUN 6 - 20 mg/dL 16  15  11    Creatinine 0.61 - 1.24 mg/dL 0.79  0.73  1.13   Sodium 135 - 145 mmol/L 143  143  145   Potassium 3.5 - 5.1 mmol/L 3.8  4.5  4.4   Chloride 98 - 111 mmol/L 104   103  105   CO2 22 - 32 mmol/L 31  29  30    Calcium 8.9 - 10.3 mg/dL 9.1  9.3  8.9   Total Protein 6.5 - 8.1 g/dL  5.2  5.8   Total Bilirubin 0.3 - 1.2 mg/dL  0.6  0.5   Alkaline Phos 38 - 126 U/L  32  42   AST 15 - 41 U/L  16  29   ALT 0 - 44 U/L  18  18      Assessment/Plan:   Principal Problem:   COPD with pneumonia (HCC) Active Problems:   Acute on chronic respiratory failure with hypoxia and hypercapnia (HCC)   Tobacco use disorder   Alcohol use disorder   Type 2 diabetes mellitus without complication, without long-term current use of insulin (HCC)   Patient Summary: James [name] is a 61 y.o. person living with a history of nocturnal oxygen-dependent COPD on chronic prednisone therapy, incidental pulmonary nodules in the setting of extensive former tobacco use T2DM, HTN who presented with shortness of breath and admitted for acute respiratory  failure likely infectious.   Aspiration Pneumonia Globus sensation Afebrile. Leukocytosis continues to improve.  Approaching baseline oxygenation, now on 2 L by nasal cannula.  Baseline is 2 L.  Patient did endorse possible aspiration event last week.  Will consider transitioning to oral antibiotics tomorrow if continues to improve clinically.  Patient underwent MBS today.  Results unremarkable though given globus sensation and concern for repeat aspiration events, will move forward with esophagram tomorrow. -Continue Unasyn -Supplement oxygen with goal sats > 92% -Prednisone 40 mg daily with slow taper -DuoNebs every 4 hours as needed -Breo Ellipta daily -Incruse Ellipta daily -Albuterol -Repeat chest CT scheduled for December, at risk for structural changes given chronic aspirations   Suspected COPD Chronic oxygen therapy Mild end expiratory wheezing on exam today.  Seems to be at baseline oxygenation.  We will continue with home regimen and DuoNebs every 4 hours as needed. -Prednisone 40 mg daily with slow taper -DuoNebs every 4  hours as needed -Breo Ellipta daily -Incruse Ellipta daily -Albuterol -Needs follow-up with pulmonology outpatient for PFTs   Hx of alcohol use disorder and alcohol withdrawal with DT's No signs of withdrawal at this point.  -CIWA protocol with ativan -multivitamin, folate, thiamine daily   Lung nodules Tobacco use disorder Prior cigarette user continues with smokeless tobacco.  CT chest 6/23 with several new pulmonary nodules including 6 mm solid mass in right lung. -Repeat CT once stable -Outpatient follow-up with pulmonology   Type 2 diabetes mellitus Last A1c 6.4% and July 2023.  Currently on glipizide at home.  Glucose relatively well controlled today despite prednisone.  Started on diet.  We will continue to monitor and adjust regimen as needed. -SSI  GERD Protonix 40 daily   HTN Hold losartan and metoprolol   Diet: NPO VTE: Enoxaparin IVF: None,None Code: Full Prior to Admission Living Arrangement: Home Anticipated Discharge Location: Home Barriers to Discharge: Improvement in respiratory status and treatment of pneumonia   Dispo: Admit patient to Inpatient with expected length of stay greater than 2 midnights  Linward Natal MD Internal Medicine Resident PGY-1 Please contact the on call pager after 5 pm and on weekends at [phone].

## 2021-10-28 NOTE — Progress Notes (Signed)
  Transition of Care [facility]) Screening Note   Patient Details  Name: James [name] Date of Birth: [birth year]   Transition of Care [facility]) CM/SW Contact:    Cyndi Bender, RN Phone Number: 10/28/2021, 8:48 AM    Transition of Care Department [facility]) has reviewed patient and no TOC needs have been identified at this time. We will continue to monitor patient advancement through interdisciplinary progression rounds. If new patient transition needs arise, please place a TOC consult.

## 2021-10-28 NOTE — Telephone Encounter (Signed)
I received a message from Ronaldo Miyamoto, RN CM stating that she will re-order home O2.  The walk test has been completed and results in Epic

## 2021-10-28 NOTE — Progress Notes (Signed)
SATURATION QUALIFICATIONS: (This note is used to comply with regulatory documentation for home oxygen)  Patient Saturations on Room Air at Rest = 92%  Patient Saturations on Room Air while Ambulating = 85%  Patient Saturations on 3 Liters of oxygen while Ambulating = 92%  Please briefly explain why patient needs home oxygen:  [place] or Office Phone: [phone]

## 2021-10-28 NOTE — Plan of Care (Signed)
  Problem: Education: Goal: Ability to describe self-care measures that may prevent or decrease complications (Diabetes Survival Skills Education) will improve Outcome: Progressing Goal: Individualized Educational Video(s) Outcome: Progressing   

## 2021-10-28 NOTE — Progress Notes (Signed)
Mobility Specialist Progress Note:   10/28/21 1605  Mobility  Activity Ambulated with assistance in hallway  Level of Assistance Standby assist, set-up cues, supervision of patient - no hands on  Assistive Device Other (Comment) (IV Pole)  Distance Ambulated (ft) 400 ft  Activity Response Tolerated well  Mobility Referral Yes  $Mobility charge 1 Mobility   Pt agreeable to mobility session. No physical assistance required. Pt required 3LO2 with exertion which is baseline. Left in bed with all needs met.     Acute Rehab Secure Chat or Office Phone: [phone]  

## 2021-10-28 NOTE — Progress Notes (Signed)
Modified Barium Swallow Progress Note  Patient Details  Name: James [name] MRN: [id number] Date of Birth: 12/13/1960  Today's Date: 10/28/2021  Modified Barium Swallow completed.  Full report located under Chart Review in the Imaging Section.  Brief recommendations include the following:  Clinical Impression  MBS completed to evaluate oropharyngeal swallow function across various textures including thin, puree, regular and barium pill. He reports frequent globus and sometimes pharyngeal sensation during meals. His hyolaryngeal elevation, epiglottic deflection, pharyngeal wall contraction and timing of laryngeal closure were within normal range and prevented penetration and aspiration during this study. There was no residual after the swallow on any consistency and swallow onset was adequate. MBS does not diagnose below the level of the UES however, esophageal scan did not reveal abnormalitites. Given pt's recurrent pna and globus sensation, a workup for possible esophageal dysphagia (barium esophagram or EGD) may be beneficial. Recommend continue regular texture, thin liquids, pills with thin and SLP reviewed esophageal precautions. Will briefly follow for continued education.   Swallow Evaluation Recommendations   Recommended Consults: Consider GI evaluation   SLP Diet Recommendations: Regular solids;Thin liquid   Liquid Administration via: Cup;Straw   Medication Administration: Whole meds with liquid   Supervision: Patient able to self feed   Compensations: Slow rate;Small sips/bites   Postural Changes: Remain semi-upright after after feeds/meals (Comment);Seated upright at 90 degrees   Oral Care Recommendations: Oral care BID        Houston Siren 10/28/2021,2:31 PM

## 2021-10-28 NOTE — Evaluation (Signed)
Clinical/Bedside Swallow Evaluation Patient Details  Name: James [name] MRN: [id number] Date of Birth: 1960-09-29  Today's Date: 10/28/2021 Time: SLP Start Time (ACUTE ONLY): 0946 SLP Stop Time (ACUTE ONLY): 5093 SLP Time Calculation (min) (ACUTE ONLY): 7 min  Past Medical History:  Past Medical History:  Diagnosis Date   Acute on chronic respiratory failure with hypoxia ([place]) 06/08/2013   CAP (community acquired pneumonia) 05/17/2018   See admit 05/17/18 ? rml  with   covid pcr neg - rx augmentin > f/u cxr in 4-6 weeks is fine unless condition declines    Community acquired pneumonia of right lower lobe of lung 05/17/2018   See admit 05/17/18 ? 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   Recurrent pneumonia 02/20/2021   Tick bite 06/03/2018   Past Surgical History:  Past Surgical History:  Procedure Laterality Date   LUNG REMOVAL, PARTIAL     VIDEO ASSISTED THORACOSCOPY (VATS)/THOROCOTOMY Left 06/11/2013   Procedure: VIDEO ASSISTED THORACOSCOPY (VATS)/THOROCOTOMY;  Surgeon: Ivin Poot, MD;  Location: [facility];  Service: Thoracic;  Laterality: Left;   VIDEO BRONCHOSCOPY N/A 06/11/2013   Procedure: VIDEO BRONCHOSCOPY;  Surgeon: Ivin Poot, MD;  Location: [facility];  Service: Thoracic;  Laterality: N/A;   VIDEO BRONCHOSCOPY N/A 08/10/2018   Procedure: VIDEO BRONCHOSCOPY WITH FLUORO;  Surgeon: Candee Furbish, MD;  Location: [facility];  Service: Endoscopy;  Laterality: N/A;   HPI:  James [name] is a 61 year old male who presented with shortness of breath. CXR 10/23: "Increased  opacity run referral worrisome for a new area of  pneumonia. Interval clearing of patchy infiltrates previously in the left mid to lower lung field."  Pt admitted 10/8-10 for pneumonia as well.  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 Previous MBS 04/22/20 without penetration or aspiration.    Assessment / Plan / Recommendation  Clinical Impression  Pt presents with functional swallowing as assessed clinically.  Pt tolerated all PO trials admininstered without any clinical s/s of aspiration and exhibited good oral clearance despite edentulism; however, a concern for prandial aspiration still exists given recurrent pneumonia.  Pt with MBSS in April of 2022 which showed no penetration or aspiration. Pt may benefit from instrumental reassessment of swallow function as clinical evaluation cannot rule out silent aspiration. Pt also complains of sensation of stasis at times, but not during today's assessment, which is painful.  He notes that it occurs equally with solids and liquids and does not believe liquid wash helps to resolve symptoms.  Pt may benefit from esophageal assessment.    Recommend continuing regular texture diet with thin liquid at this time.  Will reach out to provider to determine appropriate plans for additional testing if indicated.  SLP Visit Diagnosis: Dysphagia, unspecified (R13.10)    Aspiration Risk  Mild aspiration risk    Diet Recommendation Regular;Thin liquid   Liquid Administration via:  Cup;Straw Medication Administration: Whole meds with liquid Supervision: Patient able to self feed Compensations: Slow rate Postural Changes: Seated upright at 90 degrees    Other  Recommendations Oral Care Recommendations: Oral care BID    Recommendations for follow up therapy are one component of a multi-disciplinary  discharge planning process, led by the attending physician.  Recommendations may be updated based on patient status, additional functional criteria and insurance authorization.  Follow up Recommendations  (TBD)      Assistance Recommended at Discharge  (TBD)  Functional Status Assessment  (TBD)  Frequency and Duration  (TBD)          Prognosis Prognosis for Safe Diet Advancement:  (TBD)      Swallow Study   General Date of Onset: 10/26/21 HPI: James [name] is a 61 year old male who presented with shortness of breath. CXR 10/23: "Increased opacity run referral worrisome for a new area of  pneumonia. Interval clearing of patchy infiltrates previously in the left mid to lower lung field."  Pt admitted 10/8-10 for pneumonia as well.  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 Previous MBS 04/22/20 without penetration or aspiration. Type of Study: Bedside Swallow Evaluation Previous Swallow Assessment: MBS 04/22/20 Diet Prior to this Study: Regular;Thin liquids Temperature Spikes Noted: No History of Recent Intubation: No Behavior/Cognition: Alert;Cooperative;Pleasant mood Oral Cavity Assessment: Within Functional Limits Oral Care Completed by SLP: No Oral Cavity - Dentition: Edentulous Vision: Functional for self-feeding Self-Feeding Abilities: Able to feed self Patient Positioning: Upright in bed Baseline Vocal Quality: Normal Volitional Cough: Strong Volitional Swallow: Able to elicit    Oral/Motor/Sensory Function Overall Oral Motor/Sensory Function: Mild impairment Facial ROM: Within Functional Limits Facial Symmetry: Within Functional Limits Lingual ROM:  (tremors) Lingual Symmetry: Within Functional Limits Lingual Strength: Reduced Velum: Within Functional Limits Mandible: Within Functional Limits   Ice Chips Ice  chips: Not tested   Thin Liquid Thin Liquid: Within functional limits Presentation: Straw    Nectar Thick Nectar Thick Liquid: Not tested   Honey Thick Honey Thick Liquid: Not tested   Puree Puree: Within functional limits   Solid     Solid: Within functional limits      Celedonio Savage , [place], [place] Office: [phone] 10/28/2021,10:42 AM

## 2021-10-29 ENCOUNTER — Ambulatory Visit: Payer: Medicaid Other | Admitting: Critical Care Medicine

## 2021-10-29 ENCOUNTER — Other Ambulatory Visit: Payer: Self-pay

## 2021-10-29 ENCOUNTER — Inpatient Hospital Stay (HOSPITAL_COMMUNITY): Payer: Medicaid Other

## 2021-10-29 ENCOUNTER — Ambulatory Visit: Payer: Medicaid Other | Admitting: Physician Assistant

## 2021-10-29 DIAGNOSIS — J440 Chronic obstructive pulmonary disease with acute lower respiratory infection: Secondary | ICD-10-CM | POA: Diagnosis not present

## 2021-10-29 DIAGNOSIS — Z87891 Personal history of nicotine dependence: Secondary | ICD-10-CM | POA: Diagnosis not present

## 2021-10-29 DIAGNOSIS — J690 Pneumonitis due to inhalation of food and vomit: Secondary | ICD-10-CM | POA: Diagnosis not present

## 2021-10-29 LAB — BASIC METABOLIC PANEL
Anion gap: 8 (ref 5–15)
BUN: 15 mg/dL (ref 6–20)
CO2: 31 mmol/L (ref 22–32)
Calcium: 9.4 mg/dL (ref 8.9–10.3)
Chloride: 103 mmol/L (ref 98–111)
Creatinine, Ser: 0.67 mg/dL (ref 0.61–1.24)
GFR, Estimated: >60 mL/min (ref >60)
Glucose, Bld: 96 mg/dL (ref 70–99)
Potassium: 3.8 mmol/L (ref 3.5–5.1)
Sodium: 142 mmol/L (ref 135–145)

## 2021-10-29 LAB — CBC
HCT: 36.8 % — ABNORMAL LOW (ref 39.0–52.0)
Hemoglobin: 12 g/dL — ABNORMAL LOW (ref 13.0–17.0)
MCH: 29.6 pg (ref 26.0–34.0)
MCHC: 32.6 g/dL (ref 30.0–36.0)
MCV: 90.9 fL (ref 80.0–100.0)
Platelets: 269 10*3/uL (ref 150–400)
RBC: 4.05 MIL/uL — ABNORMAL LOW (ref 4.22–5.81)
RDW: 13.7 % (ref 11.5–15.5)
WBC: 11.1 10*3/uL — ABNORMAL HIGH (ref 4.0–10.5)
nRBC: 0 % (ref 0.0–0.2)

## 2021-10-29 LAB — GLUCOSE, CAPILLARY
Glucose-Capillary: 101 mg/dL — ABNORMAL HIGH (ref 70–99)
Glucose-Capillary: 141 mg/dL — ABNORMAL HIGH (ref 70–99)
Glucose-Capillary: 223 mg/dL — ABNORMAL HIGH (ref 70–99)

## 2021-10-29 MED ORDER — PREDNISONE 10 MG PO TABS
10.0000 mg | ORAL_TABLET | Freq: Every day | ORAL | 0 refills | Status: DC
  Filled 2021-10-29 – 2021-11-17 (×2): qty 30, 30d supply, fill #0

## 2021-10-29 MED ORDER — AMOXICILLIN-POT CLAVULANATE 500-125 MG PO TABS
500.0000 mg | ORAL_TABLET | Freq: Three times a day (TID) | ORAL | 0 refills | Status: AC
  Filled 2021-10-29: qty 6, 2d supply, fill #0

## 2021-10-29 MED ORDER — ENOXAPARIN SODIUM 60 MG/0.6ML IJ SOSY
45.0000 mg | PREFILLED_SYRINGE | INTRAMUSCULAR | Status: DC
  Administered 2021-10-29: 45 mg via SUBCUTANEOUS
  Filled 2021-10-29: qty 0.6

## 2021-10-29 MED ORDER — PREDNISONE 20 MG PO TABS
40.0000 mg | ORAL_TABLET | Freq: Every day | ORAL | 0 refills | Status: AC
  Filled 2021-10-29: qty 2, 1d supply, fill #0

## 2021-10-29 NOTE — TOC Transition Note (Addendum)
Transition of Care [facility]) - CM/SW Discharge Note   Patient Details  Name: James [name] MRN: [id number] Date of Birth: 02-Nov-1960  Transition of Care [facility]) CM/SW Contact:  Cyndi Bender, RN Phone Number: 10/29/2021, 2:33 PM   Clinical Narrative:     Patient stable for discharge.  Patient has home 02 with [employer]. [place] notified of new home 02 order and the need for portable tank at discharge.  Patient needs taxi voucher for transportation home. Voucher placed on chart. Bedside RN made aware.  Patient declines SA counseling.  Address, Phone number and PCP verified.  Final next level of care: Home/Self Care Barriers to Discharge: Barriers Resolved   Patient Goals and CMS Choice Patient states their goals for this hospitalization and ongoing recovery are:: return home      Discharge Placement             home          Discharge Plan and Services                DME Arranged: Oxygen DME Agency: [employer] Date DME Agency Contacted: 10/29/21 Time DME Agency Contacted: 1250 Representative spoke with at DME Agency: [place] ([place]) Interventions     Readmission Risk Interventions    02/04/2021   12:44 PM 08/14/2020    9:59 AM 04/24/2020    1:03 PM  Readmission Risk Prevention Plan  Post Dischage Appt Complete    Medication Screening Complete    Transportation Screening Complete Complete Complete  PCP or Specialist Appt within 3-5 Days  Complete   Not Complete comments  patient already has an appointment schedules with [name]   [facility] or [place]  Complete   Social Work Consult for [place] Planning/Counseling  Complete   Palliative Care Screening  Not Applicable   Medication Review (RN Care Manager)  Complete   PCP or Specialist appointment within 3-5 days of discharge   Not Complete  PCP/Specialist Appt Not Complete comments   Pt had existing appt 5/23--this is first available per [facility]  [place] or  [place]   Complete  SW Recovery Care/Counseling Consult   Complete  Palliative Care Screening   Complete  [place]   Not Applicable

## 2021-10-29 NOTE — TOC Progression Note (Signed)
Transition of Care [facility]) - Progression Note    Patient Details  Name: James [name] MRN: [id number] Date of Birth: 09/11/1960  Transition of Care [facility]) CM/SW [place], [place] Work Phone Number: 10/29/2021, 3:02 PM  Clinical Narrative:    MSW intern spoke with patient at bedside after receiving a consult for substance abuse counseling. Patient declined community resources that were offered.    Expected Discharge Plan: Home/Self Care Barriers to Discharge: Barriers Resolved  Expected Discharge Plan and Services Expected Discharge Plan: Home/Self Care                         DME Arranged: Oxygen DME Agency: [employer] Date DME Agency Contacted: 10/29/21 Time DME Agency Contacted: 1250 Representative spoke with at DME Agency: [place] ([place]) Interventions    Readmission Risk Interventions    02/04/2021   12:44 PM 08/14/2020    9:59 AM 04/24/2020    1:03 PM  Readmission Risk Prevention Plan  Post Dischage Appt Complete    Medication Screening Complete    Transportation Screening Complete Complete Complete  PCP or Specialist Appt within 3-5 Days  Complete   Not Complete comments  patient already has an appointment schedules with [name]   [facility] or [place]  Complete   Social Work Consult for [place] Planning/Counseling  Complete   Palliative Care Screening  Not Applicable   Medication Review (RN Care Manager)  Complete   PCP or Specialist appointment within 3-5 days of discharge   Not Complete  PCP/Specialist Appt Not Complete comments   Pt had existing appt 5/23--this is first available per [facility]  [place] or [place]   Complete  SW Recovery Care/Counseling Consult   Complete  Palliative Care Screening   Complete  [place]   Not Applicable

## 2021-10-29 NOTE — Progress Notes (Deleted)
Name: James [name] MRN: [id number] DOB: June 17, 1960 61 y.o. PCP: [name], MD  Date of Admission: 10/26/2021  3:34 AM Date of Discharge: 10/29/2021  Attending Physician: Axel Filler, *  Discharge Diagnosis: 1. 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 What changed: Another medication with the same name was changed. Make sure you understand how and when to take each.   Ventolin HFA 108 (90 Base) MCG/ACT inhaler Generic drug: albuterol Inhale 2 puffs into the lungs every 4 (four) hours as needed. What changed: reasons to take this   amoxicillin-clavulanate 500-125 MG tablet Commonly known as: Augmentin Take 1 tablet by mouth 3 (three) times daily for 2 days.   Breztri Aerosphere 160-9-4.8 MCG/ACT Aero Generic drug: Budeson-Glycopyrrol-Formoterol Inhale 2 puffs into the lungs in the morning and at bedtime.   clobetasol cream 0.05 % Commonly known as: TEMOVATE Apply 1 Application topically 2 (two) times daily. What changed:  when to take this reasons to take this   clotrimazole-betamethasone cream Commonly known as: LOTRISONE Apply 1 Application topically 2 (two) times daily to groin affected areas What changed:  when to take this reasons to take this   furosemide 20 MG tablet Commonly known as: LASIX Take 1 tablet (20 mg total) by mouth daily as needed for edema.   glipiZIDE 5 MG 24 hr  tablet Commonly known as: GLUCOTROL XL Take 1 tablet (5 mg total) by mouth daily with breakfast.   losartan 50 MG tablet Commonly known as: COZAAR Take 1 tablet (50 mg total) by mouth daily.   metoprolol succinate 25 MG 24 hr tablet Commonly known as: TOPROL-XL Take 1 tablet (25 mg total) by mouth daily.   pantoprazole 40 MG tablet Commonly known as: PROTONIX Take 1 tablet (40 mg total) by mouth daily.   predniSONE 10 MG tablet Commonly known as: DELTASONE Take 1 tablet (10 mg total) by mouth daily. What changed: You were already taking a medication with the same name, and this prescription was added. Make sure you understand how and when to take each.   predniSONE 20 MG tablet Commonly known as: DELTASONE Take 2 tablets (40 mg total) by mouth daily with breakfast for 1 day. Start taking on: October 30, 2021 What changed:  medication strength how much to take   pregabalin 100 MG capsule Commonly known as: Lyrica Take 1 capsule (100 mg total) by mouth 2 (two) times daily.   vitamin D3 25 MCG tablet Commonly known as: CHOLECALCIFEROL Take 1 tablet by mouth daily.               Durable Medical Equipment  (From admission, onward)           Start     Ordered   10/28/21 1705  For home use only DME oxygen  Once       Question Answer Comment  Length of Need Lifetime   Oxygen delivery  system Gas      10/28/21 1705            Disposition and follow-up:   James [name] was discharged from [facility] in Stable condition.  At the hospital follow up visit please address:  1.  Aspiration Pneumonia in setting of COPD: At baseline oxygenation, now on 2 L by nasal cannula.  Will discharge with 2 days of Augmentin and one more day of prednisone. Patient did endorse possible aspiration events last week. Esophagram with mild esophageal dysmotility but no fixed obstruction. Referral sent for GI f/u outpatient to further evaluate lower esophagus.  Please follow up to ensure remains at baseline oxygenation. Repeat WBC per provider discretion.  Suspected COPD exac: Mild end expiratory wheezing on exam today.  Seems to be at baseline oxygenation. Patient to discharge back on home regimen. With mild end expiratory wheezing at day of discharge. Needs outpatient follow up with pulmonology for PFTs.   Lung nodules: Scheduled for repeat CT chest in December. Please encourage to follow through with this.  2.  Labs / imaging needed at time of follow-up: CBC  3.  Pending labs/ test needing follow-up: none  Follow-up Appointments:   Hospital Course by problem list: 1. Aspiration pneumonia in setting of COPD exacerbation: patient admitted to our service for management of acute on chronic hypoxic and hypercarbic respiratory failure due to COPD and pneumonia. Escalated to BiPAP but weaned by HD 2. Chest x-ray has some opacity on the right lower lobes, though portable film.  No pleural effusions.  Labs showed a high neutrophil predominant leukocytosis and moderately elevated lactic acid. Treated with vancomycin and cefepime given risk of resistant organism. Increased prednisone to 40 mg daily and restart a slow taper.  Continued with inhaled bronchodilators with Breo and Incruse, albuterol nebulizer as well as Duonebs q4 hrs prn. Wheezing improved and patient's oxygenation returned to baseline 2L via [place]. Discontinued vancomycin after negative MRSA swab and switch cefepime to Unasyn. Patient continued to improve and discharged with Augmentin to complete total 7 days of abx.  Discharge Exam:   BP (!) 148/80 (BP Location: Right Arm)   Pulse 78   Temp 97.6 F (36.4 C) (Oral)   Resp 18   Ht 5\' 9"  (1.753 m)   Wt 94.9 kg   SpO2 96%   BMI 30.90 kg/m  Discharge exam: Physical Exam Constitutional:      General: He is not in acute distress.    Comments: [place] in place at 2L  HENT:     Head: Normocephalic and atraumatic.  Eyes:     Extraocular Movements:  Extraocular movements intact.  Pulmonary:     Effort: Pulmonary effort is normal.     Comments: Mild end expiratory wheezing. No crackles. Skin:    General: Skin is warm and dry.  Neurological:     General: No focal deficit present.     Mental Status: He is alert and oriented to person, place, and time.  Psychiatric:        Mood and Affect: Mood normal.        Behavior: Behavior normal.      Pertinent Labs, Studies, and Procedures:      Latest Ref Rng & Units 10/29/2021    5:21 AM 10/28/2021    7:48 AM 10/27/2021    9:58 AM  CMP  Glucose 70 - 99 mg/dL 96  107  134   BUN 6 - 20 mg/dL 15  16  15   Creatinine 0.61 - 1.24 mg/dL 0.67  0.79  0.73   Sodium 135 - 145 mmol/L 142  143  143   Potassium 3.5 - 5.1 mmol/L 3.8  3.8  4.5   Chloride 98 - 111 mmol/L 103  104  103   CO2 22 - 32 mmol/L 31  31  29    Calcium 8.9 - 10.3 mg/dL 9.4  9.1  9.3   Total Protein 6.5 - 8.1 g/dL   5.2   Total Bilirubin 0.3 - 1.2 mg/dL   0.6   Alkaline Phos 38 - 126 U/L   32   AST 15 - 41 U/L   16   ALT 0 - 44 U/L   18       Latest Ref Rng & Units 10/29/2021    5:21 AM 10/28/2021    7:48 AM 10/27/2021    9:58 AM  CMP  Glucose 70 - 99 mg/dL 96  107  134   BUN 6 - 20 mg/dL 15  16  15    Creatinine 0.61 - 1.24 mg/dL 0.67  0.79  0.73   Sodium 135 - 145 mmol/L 142  143  143   Potassium 3.5 - 5.1 mmol/L 3.8  3.8  4.5   Chloride 98 - 111 mmol/L 103  104  103   CO2 22 - 32 mmol/L 31  31  29    Calcium 8.9 - 10.3 mg/dL 9.4  9.1  9.3   Total Protein 6.5 - 8.1 g/dL   5.2   Total Bilirubin 0.3 - 1.2 mg/dL   0.6   Alkaline Phos 38 - 126 U/L   32   AST 15 - 41 U/L   16   ALT 0 - 44 U/L   18     DG ESOPHAGUS W SINGLE CM (SOL OR THIN BA)  Result Date: 10/29/2021 CLINICAL DATA:  Provided history: Globus sensation. At risk for aspiration pneumonia. Cough. EXAM: ESOPHAGUS/BARIUM SWALLOW/TABLET STUDY TECHNIQUE: A single contrast examination was performed using thin liquid barium. The exam was performed by  Durenda Guthrie, PA-C, and was supervised and interpreted by Dr. Kellie Simmering. FLUOROSCOPY: Fluoroscopy time: 2 minutes (16.6 mGy). COMPARISON:  Chest CT 06/10/2021. FINDINGS: Fluoroscopic evaluation demonstrates normal caliber and smooth contour of the esophagus. No evidence of fixed stricture, mass or mucosal abnormality. Mild esophageal dysmotility with intermittent incomplete stripping of the cervical esophagus. Small hiatal hernia. Small-volume gastroesophageal reflux was observed to the level of the lower esophagus. This occurred during the water siphon maneuver. The patient swallowed a 13 mm barium tablet, which freely passed into the stomach. IMPRESSION: Mild esophageal dysmotility with intermittent incomplete stripping of the cervical esophagus. Small hiatal hernia. Small-volume gastroesophageal reflux observed to the level of the lower esophagus during the water siphon maneuver. Electronically Signed   By: Kellie Simmering D.O.   On: 10/29/2021 11:24   DG Swallowing Func-Speech Pathology  Result Date: 10/28/2021 Table formatting from the original result was not included. Objective Swallowing Evaluation: Type of Study: MBS-Modified Barium Swallow Study  Patient Details Name: JONATHON TAN MRN: [id number] Date of Birth: 11/11/1960 Today's Date: 10/28/2021 Time: SLP Start Time (ACUTE ONLY): 1332 -SLP Stop Time (ACUTE ONLY): 1342 SLP Time Calculation (min) (ACUTE ONLY): 10 min Past Medical History: Past Medical History: Diagnosis Date  Acute on chronic respiratory failure with hypoxia ([place]) 06/08/2013  CAP (community acquired pneumonia) 05/17/2018  See admit 05/17/18 ? rml  with   covid pcr neg - rx augmentin >  f/u cxr in 4-6 weeks is fine unless condition declines   Community acquired pneumonia of right lower lobe of lung 05/17/2018  See admit 05/17/18 ? 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  Recurrent pneumonia 02/20/2021  Tick bite 06/03/2018 Past Surgical History: Past Surgical History: Procedure Laterality Date  LUNG REMOVAL, PARTIAL    VIDEO ASSISTED THORACOSCOPY (VATS)/THOROCOTOMY Left 06/11/2013  Procedure: VIDEO ASSISTED THORACOSCOPY (VATS)/THOROCOTOMY;  Surgeon: Ivin Poot, MD;  Location: [place];  Service: Thoracic;  Laterality: Left;  VIDEO BRONCHOSCOPY N/A 06/11/2013  Procedure: VIDEO BRONCHOSCOPY;  Surgeon: Ivin Poot, MD;  Location: [place];  Service: Thoracic;  Laterality: N/A;  VIDEO BRONCHOSCOPY N/A 08/10/2018  Procedure: VIDEO BRONCHOSCOPY WITH FLUORO;  Surgeon: Candee Furbish, MD;  Location[facility] END[facility]COPY;  Service: Endoscopy;  Laterality: N/A; HPI: Mr. Leete is a 61 year old male who presented with shortness of breath. CXR 10/23: "Increased opacity run referral worrisome for a new area of  pneumonia. Interval clearing of patchy infiltrates previously in the left mid to lower lung field."  Pt admitted 10/8-10 for pneumonia as well.  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 Previous MBS 04/22/20 without penetration or aspiration.  No data recorded  Recommendations for follow up therapy are one component of a multi-disciplinary discharge planning process, led by the attending physician.  Recommendations may be updated based on patient status, additional functional criteria and insurance authorization. Assessment / Plan / Recommendation   10/28/2021   2:06 PM Clinical Impressions Clinical Impression MBS completed to evaluate oropharyngeal swallow function  across various textures including thin, puree, regular and barium pill. He reports frequent globus and sometimes pharyngeal sensation during meals. His hyolaryngeal elevation, epiglottic deflection, pharyngeal wall contraction and timing of laryngeal closure were within normal range and prevented penetration and aspiration during this study. There was no residual after the swallow on any consistency and swallow onset was adequate. MBS does not diagnose below the level of the UES however, esophageal scan did not reveal abnormalitites. Given pt's recurrent pna and globus sensation, a workup for possible esophageal dysphagia (barium esophagram or EGD) may be beneficial. Recommend continue regular texture, thin liquids, pills with thin and SLP reviewed esophageal precautions. Will briefly follow for continued education. SLP Visit Diagnosis Dysphagia, unspecified (R13.10) Impact on safety and function No limitations     10/28/2021   2:06 PM Treatment Recommendations Treatment Recommendations Therapy as outlined in treatment plan below     10/28/2021   2:06 PM Prognosis Prognosis for Safe Diet Advancement Good   10/28/2021   2:06 PM Diet Recommendations SLP Diet Recommendations Regular solids;Thin liquid Liquid Administration via Cup;Straw Medication Administration Whole meds with liquid Compensations Slow rate;Small sips/bites Postural Changes Remain semi-upright after after feeds/meals (Comment);Seated upright at 90 degrees     10/28/2021   2:06 PM Other Recommendations Recommended Consults Consider GI evaluation Oral Care Recommendations Oral care BID Follow Up Recommendations No SLP follow up Assistance recommended at  discharge None Functional Status Assessment Patient has had a recent decline in their functional status and demonstrates the ability to make significant improvements in function in a reasonable and predictable amount of time.   10/28/2021   2:06 PM Frequency and Duration  Speech Therapy Frequency (ACUTE  ONLY) min 1 x/week Treatment Duration 1 week     10/28/2021   2:06 PM Oral Phase Oral Phase [facility]    10/28/2021   2:06 PM Pharyngeal Phase Pharyngeal Phase [facility]    10/28/2021   2:06 PM Cervical Esophageal Phase  Cervical Esophageal Phase [facility] [place] [facility] 10/28/2021, 2:31 PM                     DG Chest Port 1 View  Result Date: 10/26/2021 CLINICAL DATA:  Questionable sepsis. EXAM: PORTABLE CHEST 1 VIEW COMPARISON:  Portable chest 10/11/2021 FINDINGS: There is increased right infrahilar opacity concerning for a new area of pneumonia. Patchy infiltrates previously in the left mid to lower lung field are no longer seen. Remaining lungs are clear with emphysematous changes. There is chronic left lateral sulcal blunting. Heart size and vasculature are normal with stable mediastinum and aortic atherosclerotic spread There is osteopenia and multiple chronic healed fracture deformities of the ribcage. No pneumothorax. IMPRESSION: Increased opacity run referral worrisome for a new area of pneumonia. Interval clearing of patchy infiltrates previously in the left mid to lower lung field. Electronically Signed   By: Telford Nab M.D.   On: 10/26/2021 04:20     Discharge Instructions: Discharge Instructions     Ambulatory referral to Gastroenterology   Complete by: As directed    Further evaluation of swallowing difficulty, possibly with EGD to evaluate more distal esophagus. MBS inpatient with mild esophageal dysmotility, mild reflux. Patient now with multiple hospitalizations for pneumonia and concerned for aspiration.   What is the reason for referral?: Other Comment - swallowing difficulty   Diet - low sodium heart healthy   Complete by: As directed    Discharge instructions   Complete by: As directed    Mr. Brearley,  It was a pleasure taking care of you during your admission here at [facility].  You came in with pneumonia and were treated with antibiotics and are improving.  You also underwent  evaluation for swallowing difficulty with an esophagram.  The results were nonconcerning, but we would like for you to follow-up with gastroenterology to further evaluate. -Please schedule a follow-up with your primary care provider in the next 1 to 2 weeks -Gastroenterology should call you to schedule an appointment -Please take Augmentin three times daily for two days beginning tomorrow -Please take prednisone 40 mg tomorrow to complete your course   Increase activity slowly   Complete by: As directed        Signed: Linward Natal, MD 10/29/2021, 3:21 PM   Pager: [phone]

## 2021-10-29 NOTE — Plan of Care (Signed)
  Problem: Education: Goal: Ability to describe self-care measures that may prevent or decrease complications (Diabetes Survival Skills Education) will improve Outcome: Progressing   Problem: Metabolic: Goal: Ability to maintain appropriate glucose levels will improve Outcome: Progressing

## 2021-10-29 NOTE — Discharge Summary (Signed)
Name: James [name] MRN: [id number] DOB: 15-Feb-1960 61 y.o. PCP: [name], MD  Date of Admission: 10/26/2021  3:34 AM Date of Discharge: 10/29/2021  Attending Physician: Axel Filler, *  Discharge Diagnosis: 1. Principal Problem:   COPD with pneumonia ([place]) Active Problems:   Acute on chronic respiratory failure with hypoxia and hypercapnia (HCC)   Tobacco use disorder   Alcohol use disorder   Type 2 diabetes mellitus without complication, without long-term current use of insulin ([place])   Discharge Medications: Allergies as of 10/29/2021   No Known Allergies      Medication List     TAKE these medications    albuterol (2.5 MG/3ML) 0.083% nebulizer solution Commonly known as: PROVENTIL Take 3 mLs (2.5 mg total) by nebulization every 6 (six) hours as needed for wheezing or shortness of breath. What changed: Another medication with the same name was changed. Make sure you understand how and when to take each.   Ventolin HFA 108 (90 Base) MCG/ACT inhaler Generic drug: albuterol Inhale 2 puffs into the lungs every 4 (four) hours as needed. What changed: reasons to take this   amoxicillin-clavulanate 500-125 MG tablet Commonly known as: Augmentin Take 1 tablet by mouth 3 (three) times daily for 2 days.   Breztri Aerosphere 160-9-4.8 MCG/ACT Aero Generic drug: Budeson-Glycopyrrol-Formoterol Inhale 2 puffs into the lungs in the morning and at bedtime.   clobetasol cream 0.05 % Commonly known as: TEMOVATE Apply 1 Application topically 2 (two) times daily. What changed:  when to take this reasons to take this   clotrimazole-betamethasone cream Commonly known as: LOTRISONE Apply 1 Application topically 2 (two) times daily to groin affected areas What changed:  when to take this reasons to take this   furosemide 20 MG tablet Commonly known as: LASIX Take 1 tablet (20 mg total) by mouth daily as needed for edema.   glipiZIDE 5 MG 24 hr  tablet Commonly known as: GLUCOTROL XL Take 1 tablet (5 mg total) by mouth daily with breakfast.   losartan 50 MG tablet Commonly known as: COZAAR Take 1 tablet (50 mg total) by mouth daily.   metoprolol succinate 25 MG 24 hr tablet Commonly known as: TOPROL-XL Take 1 tablet (25 mg total) by mouth daily.   pantoprazole 40 MG tablet Commonly known as: PROTONIX Take 1 tablet (40 mg total) by mouth daily.   predniSONE 10 MG tablet Commonly known as: DELTASONE Take 1 tablet (10 mg total) by mouth daily. What changed: You were already taking a medication with the same name, and this prescription was added. Make sure you understand how and when to take each.   predniSONE 20 MG tablet Commonly known as: DELTASONE Take 2 tablets (40 mg total) by mouth daily with breakfast for 1 day. Start taking on: October 30, 2021 What changed:  medication strength how much to take   pregabalin 100 MG capsule Commonly known as: Lyrica Take 1 capsule (100 mg total) by mouth 2 (two) times daily.   vitamin D3 25 MCG tablet Commonly known as: CHOLECALCIFEROL Take 1 tablet by mouth daily.               Durable Medical Equipment  (From admission, onward)           Start     Ordered   10/28/21 1705  For home use only DME oxygen  Once       Question Answer Comment  Length of Need Lifetime   Oxygen delivery  system Gas      10/28/21 1705            Disposition and follow-up:   James [name] was discharged from [facility] in Stable condition.  At the hospital follow up visit please address:  1.  Aspiration Pneumonia in setting of COPD: At baseline oxygenation, now on 2 L by nasal cannula.  Will discharge with 2 days of Augmentin and one more day of prednisone. Patient did endorse possible aspiration events last week. Esophagram with mild esophageal dysmotility but no fixed obstruction. Referral sent for GI f/u outpatient to further evaluate lower esophagus.  Please follow up to ensure remains at baseline oxygenation. Repeat WBC per provider discretion.  Suspected COPD exac: Mild end expiratory wheezing on exam today.  Seems to be at baseline oxygenation. Patient to discharge back on home regimen. With mild end expiratory wheezing at day of discharge. Needs outpatient follow up with pulmonology for PFTs.   Lung nodules: Scheduled for repeat CT chest in December. Please encourage to follow through with this.  2.  Labs / imaging needed at time of follow-up: CBC  3.  Pending labs/ test needing follow-up: none  Follow-up Appointments:   Hospital Course by problem list: 1. Aspiration pneumonia in setting of COPD exacerbation: patient admitted to our service for management of acute on chronic hypoxic and hypercarbic respiratory failure due to COPD and pneumonia. Escalated to BiPAP but weaned by HD 2. Chest x-ray has some opacity on the right lower lobes, though portable film.  No pleural effusions.  Labs showed a high neutrophil predominant leukocytosis and moderately elevated lactic acid. Treated with vancomycin and cefepime given risk of resistant organism. Increased prednisone to 40 mg daily and restart a slow taper.  Continued with inhaled bronchodilators with Breo and Incruse, albuterol nebulizer as well as Duonebs q4 hrs prn. Wheezing improved and patient's oxygenation returned to baseline 2L via [place]. Discontinued vancomycin after negative MRSA swab and switch cefepime to Unasyn. Patient continued to improve and discharged with Augmentin to complete total 7 days of abx.  Discharge Exam:   BP (!) 148/80 (BP Location: Right Arm)   Pulse 78   Temp 97.6 F (36.4 C) (Oral)   Resp 18   Ht 5\' 9"  (1.753 m)   Wt 94.9 kg   SpO2 96%   BMI 30.90 kg/m  Discharge exam: Physical Exam Constitutional:      General: He is not in acute distress.    Comments: [place] in place at 2L  HENT:     Head: Normocephalic and atraumatic.  Eyes:     Extraocular Movements:  Extraocular movements intact.  Pulmonary:     Effort: Pulmonary effort is normal.     Comments: Mild end expiratory wheezing. No crackles. Skin:    General: Skin is warm and dry.  Neurological:     General: No focal deficit present.     Mental Status: He is alert and oriented to person, place, and time.  Psychiatric:        Mood and Affect: Mood normal.        Behavior: Behavior normal.      Pertinent Labs, Studies, and Procedures:      Latest Ref Rng & Units 10/29/2021    5:21 AM 10/28/2021    7:48 AM 10/27/2021    9:58 AM  CMP  Glucose 70 - 99 mg/dL 96  107  134   BUN 6 - 20 mg/dL 15  16  15   Creatinine 0.61 - 1.24 mg/dL 0.67  0.79  0.73   Sodium 135 - 145 mmol/L 142  143  143   Potassium 3.5 - 5.1 mmol/L 3.8  3.8  4.5   Chloride 98 - 111 mmol/L 103  104  103   CO2 22 - 32 mmol/L 31  31  29    Calcium 8.9 - 10.3 mg/dL 9.4  9.1  9.3   Total Protein 6.5 - 8.1 g/dL   5.2   Total Bilirubin 0.3 - 1.2 mg/dL   0.6   Alkaline Phos 38 - 126 U/L   32   AST 15 - 41 U/L   16   ALT 0 - 44 U/L   18       Latest Ref Rng & Units 10/29/2021    5:21 AM 10/28/2021    7:48 AM 10/27/2021    9:58 AM  CMP  Glucose 70 - 99 mg/dL 96  107  134   BUN 6 - 20 mg/dL 15  16  15    Creatinine 0.61 - 1.24 mg/dL 0.67  0.79  0.73   Sodium 135 - 145 mmol/L 142  143  143   Potassium 3.5 - 5.1 mmol/L 3.8  3.8  4.5   Chloride 98 - 111 mmol/L 103  104  103   CO2 22 - 32 mmol/L 31  31  29    Calcium 8.9 - 10.3 mg/dL 9.4  9.1  9.3   Total Protein 6.5 - 8.1 g/dL   5.2   Total Bilirubin 0.3 - 1.2 mg/dL   0.6   Alkaline Phos 38 - 126 U/L   32   AST 15 - 41 U/L   16   ALT 0 - 44 U/L   18     DG ESOPHAGUS W SINGLE CM (SOL OR THIN BA)  Result Date: 10/29/2021 CLINICAL DATA:  Provided history: Globus sensation. At risk for aspiration pneumonia. Cough. EXAM: ESOPHAGUS/BARIUM SWALLOW/TABLET STUDY TECHNIQUE: A single contrast examination was performed using thin liquid barium. The exam was performed by  Durenda Guthrie, PA-C, and was supervised and interpreted by Dr. Kellie Simmering. FLUOROSCOPY: Fluoroscopy time: 2 minutes (16.6 mGy). COMPARISON:  Chest CT 06/10/2021. FINDINGS: Fluoroscopic evaluation demonstrates normal caliber and smooth contour of the esophagus. No evidence of fixed stricture, mass or mucosal abnormality. Mild esophageal dysmotility with intermittent incomplete stripping of the cervical esophagus. Small hiatal hernia. Small-volume gastroesophageal reflux was observed to the level of the lower esophagus. This occurred during the water siphon maneuver. The patient swallowed a 13 mm barium tablet, which freely passed into the stomach. IMPRESSION: Mild esophageal dysmotility with intermittent incomplete stripping of the cervical esophagus. Small hiatal hernia. Small-volume gastroesophageal reflux observed to the level of the lower esophagus during the water siphon maneuver. Electronically Signed   By: Kellie Simmering D.O.   On: 10/29/2021 11:24   DG Swallowing Func-Speech Pathology  Result Date: 10/28/2021 Table formatting from the original result was not included. Objective Swallowing Evaluation: Type of Study: MBS-Modified Barium Swallow Study  Patient Details Name: James [name] MRN: [id number] Date of Birth: 04-04-1960 Today's Date: 10/28/2021 Time: SLP Start Time (ACUTE ONLY): 1332 -SLP Stop Time (ACUTE ONLY): 1342 SLP Time Calculation (min) (ACUTE ONLY): 10 min Past Medical History: Past Medical History: Diagnosis Date  Acute on chronic respiratory failure with hypoxia ([place]) 06/08/2013  CAP (community acquired pneumonia) 05/17/2018  See admit 05/17/18 ? rml  with   covid pcr neg - rx augmentin >  f/u cxr in 4-6 weeks is fine unless condition declines   Community acquired pneumonia of right lower lobe of lung 05/17/2018  See admit 05/17/18 ? 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  Recurrent pneumonia 02/20/2021  Tick bite 06/03/2018 Past Surgical History: Past Surgical History: Procedure Laterality Date  LUNG REMOVAL, PARTIAL    VIDEO ASSISTED THORACOSCOPY (VATS)/THOROCOTOMY Left 06/11/2013  Procedure: VIDEO ASSISTED THORACOSCOPY (VATS)/THOROCOTOMY;  Surgeon: Ivin Poot, MD;  Location: [place];  Service: Thoracic;  Laterality: Left;  VIDEO BRONCHOSCOPY N/A 06/11/2013  Procedure: VIDEO BRONCHOSCOPY;  Surgeon: Ivin Poot, MD;  Location: [place];  Service: Thoracic;  Laterality: N/A;  VIDEO BRONCHOSCOPY N/A 08/10/2018  Procedure: VIDEO BRONCHOSCOPY WITH FLUORO;  Surgeon: Candee Furbish, MD;  Location: [facility] ENDOSCOPY;  Service: Endoscopy;  Laterality: N/A; HPI: Mr. Mondry is a 61 year old male who presented with shortness of breath. CXR 10/23: "Increased opacity run referral worrisome for a new area of  pneumonia. Interval clearing of patchy infiltrates previously in the left mid to lower lung field."  Pt admitted 10/8-10 for pneumonia as well.  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 Previous MBS 04/22/20 without penetration or aspiration.  No data recorded  Recommendations for follow up therapy are one component of a multi-disciplinary discharge planning process, led by the attending physician.  Recommendations may be updated based on patient status, additional functional criteria and insurance authorization. Assessment / Plan / Recommendation   10/28/2021   2:06 PM Clinical Impressions Clinical Impression MBS completed to evaluate oropharyngeal swallow function  across various textures including thin, puree, regular and barium pill. He reports frequent globus and sometimes pharyngeal sensation during meals. His hyolaryngeal elevation, epiglottic deflection, pharyngeal wall contraction and timing of laryngeal closure were within normal range and prevented penetration and aspiration during this study. There was no residual after the swallow on any consistency and swallow onset was adequate. MBS does not diagnose below the level of the UES however, esophageal scan did not reveal abnormalitites. Given pt's recurrent pna and globus sensation, a workup for possible esophageal dysphagia (barium esophagram or EGD) may be beneficial. Recommend continue regular texture, thin liquids, pills with thin and SLP reviewed esophageal precautions. Will briefly follow for continued education. SLP Visit Diagnosis Dysphagia, unspecified (R13.10) Impact on safety and function No limitations     10/28/2021   2:06 PM Treatment Recommendations Treatment Recommendations Therapy as outlined in treatment plan below     10/28/2021   2:06 PM Prognosis Prognosis for Safe Diet Advancement Good   10/28/2021   2:06 PM Diet Recommendations SLP Diet Recommendations Regular solids;Thin liquid Liquid Administration via Cup;Straw Medication Administration Whole meds with liquid Compensations Slow rate;Small sips/bites Postural Changes Remain semi-upright after after feeds/meals (Comment);Seated upright at 90 degrees     10/28/2021   2:06 PM Other Recommendations Recommended Consults Consider GI evaluation Oral Care Recommendations Oral care BID Follow Up Recommendations No SLP follow up Assistance recommended at  discharge None Functional Status Assessment Patient has had a recent decline in their functional status and demonstrates the ability to make significant improvements in function in a reasonable and predictable amount of time.   10/28/2021   2:06 PM Frequency and Duration  Speech Therapy Frequency (ACUTE  ONLY) min 1 x/week Treatment Duration 1 week     10/28/2021   2:06 PM Oral Phase Oral Phase [facility]    10/28/2021   2:06 PM Pharyngeal Phase Pharyngeal Phase [facility]    10/28/2021   2:06 PM Cervical Esophageal Phase  Cervical Esophageal Phase [facility] [place] [facility] 10/28/2021, 2:31 PM                     DG Chest Port 1 View  Result Date: 10/26/2021 CLINICAL DATA:  Questionable sepsis. EXAM: PORTABLE CHEST 1 VIEW COMPARISON:  Portable chest 10/11/2021 FINDINGS: There is increased right infrahilar opacity concerning for a new area of pneumonia. Patchy infiltrates previously in the left mid to lower lung field are no longer seen. Remaining lungs are clear with emphysematous changes. There is chronic left lateral sulcal blunting. Heart size and vasculature are normal with stable mediastinum and aortic atherosclerotic spread There is osteopenia and multiple chronic healed fracture deformities of the ribcage. No pneumothorax. IMPRESSION: Increased opacity run referral worrisome for a new area of pneumonia. Interval clearing of patchy infiltrates previously in the left mid to lower lung field. Electronically Signed   By: Telford Nab M.D.   On: 10/26/2021 04:20     Discharge Instructions: Discharge Instructions     Ambulatory referral to Gastroenterology   Complete by: As directed    Further evaluation of swallowing difficulty, possibly with EGD to evaluate more distal esophagus. MBS inpatient with mild esophageal dysmotility, mild reflux. Patient now with multiple hospitalizations for pneumonia and concerned for aspiration.   What is the reason for referral?: Other Comment - swallowing difficulty   Diet - low sodium heart healthy   Complete by: As directed    Discharge instructions   Complete by: As directed    Mr. Pascucci,  It was a pleasure taking care of you during your admission here at [facility].  You came in with pneumonia and were treated with antibiotics and are improving.  You also underwent  evaluation for swallowing difficulty with an esophagram.  The results were nonconcerning, but we would like for you to follow-up with gastroenterology to further evaluate. -Please schedule a follow-up with your primary care provider in the next 1 to 2 weeks -Gastroenterology should call you to schedule an appointment -Please take Augmentin three times daily for two days beginning tomorrow -Please take prednisone 40 mg tomorrow to complete your course   Increase activity slowly   Complete by: As directed        Signed: Linward Natal, MD 10/29/2021, 3:42 PM   Pager: [phone]

## 2021-10-30 ENCOUNTER — Other Ambulatory Visit (HOSPITAL_COMMUNITY): Payer: Self-pay

## 2021-10-30 NOTE — Consult Note (Signed)
   [facility] CM Inpatient Consult   10/30/2021  ARLENE GENOVA 02-28-1960 [id number]  Late entry 10/30/21:  Managed Medicaid:  [employer]  Patient was active with Managed Medicaid team.   Will alert MM team of admission and follow up needs.  Natividad Brood, RN BSN [place]  [phone] business mobile phone Toll free office [phone]  *[place]  [phone] Fax number: [phone] [email or web address]

## 2021-10-31 LAB — CULTURE, BLOOD (ROUTINE X 2)
Culture: NO GROWTH
Culture: NO GROWTH
Special Requests: ADEQUATE
Special Requests: ADEQUATE

## 2021-11-02 ENCOUNTER — Telehealth: Payer: Self-pay | Admitting: *Deleted

## 2021-11-02 NOTE — Patient Outreach (Signed)
Care Coordination  11/02/2021  CARRELL RAHMANI July 21, 1960 [id number]   Transition Care Management Unsuccessful Follow-up Telephone Call  Date of discharge and from where:  10/29/21 from [facility]  Attempts:  1st Attempt  Reason for unsuccessful TCM follow-up call:  Unable to leave message   [name] RN, [place] RN Care Coordinator

## 2021-11-03 ENCOUNTER — Telehealth: Payer: Self-pay | Admitting: *Deleted

## 2021-11-03 NOTE — Patient Outreach (Signed)
Care Coordination  11/03/2021  BURNETT SPRAY 03-Jan-1961 [id number]   Transition Care Management Unsuccessful Follow-up Telephone Call  Date of discharge and from where:  10/29/21 from [facility]  Attempts:  2nd Attempt  Reason for unsuccessful TCM follow-up call:  Unable to leave message   [name] RN, [place] RN Care Coordinator

## 2021-11-04 DIAGNOSIS — J9621 Acute and chronic respiratory failure with hypoxia: Secondary | ICD-10-CM | POA: Diagnosis not present

## 2021-11-04 DIAGNOSIS — F79 Unspecified intellectual disabilities: Secondary | ICD-10-CM | POA: Diagnosis not present

## 2021-11-04 DIAGNOSIS — J9601 Acute respiratory failure with hypoxia: Secondary | ICD-10-CM | POA: Diagnosis not present

## 2021-11-05 ENCOUNTER — Telehealth: Payer: Self-pay

## 2021-11-05 NOTE — Progress Notes (Signed)
  Medicaid Managed Care Note  11/05/2021 Name: James [name] MRN: [id number] DOB: August 10, 1960  James [name] is a 61 y.o. year old male who is a primary care patient of [name], MD and is actively engaged with the care management team. I reached out to [name] by phone today to assist with re-scheduling a follow up visit with the RN Case Manager  Follow up plan: The care management team will reach out to the patient again over the next 7 days.   [place]

## 2021-11-06 ENCOUNTER — Other Ambulatory Visit: Payer: Self-pay

## 2021-11-06 ENCOUNTER — Telehealth: Payer: Self-pay | Admitting: *Deleted

## 2021-11-06 ENCOUNTER — Other Ambulatory Visit (HOSPITAL_COMMUNITY): Payer: Self-pay

## 2021-11-06 ENCOUNTER — Encounter: Payer: Self-pay | Admitting: *Deleted

## 2021-11-06 DIAGNOSIS — J9601 Acute respiratory failure with hypoxia: Secondary | ICD-10-CM | POA: Diagnosis not present

## 2021-11-06 DIAGNOSIS — J9621 Acute and chronic respiratory failure with hypoxia: Secondary | ICD-10-CM | POA: Diagnosis not present

## 2021-11-06 DIAGNOSIS — F79 Unspecified intellectual disabilities: Secondary | ICD-10-CM | POA: Diagnosis not present

## 2021-11-06 NOTE — Patient Outreach (Signed)
Care Coordination  11/06/2021  James [name] 1960/08/24 [id number]   Transition Care Management Follow-up Telephone Call Date of discharge and from where: 10/29/21 from [facility] How have you been since you were released from the hospital? "I am feeling better" Any questions or concerns? No  Items Reviewed: Did the pt receive and understand the discharge instructions provided? Yes  Medications obtained and verified?  Reviewed medications, patient taking all except Breztri which he has not received from the pharmacy. RNCM contacted pharmacy, medication is a non-preferred, pharmacy sent a PA which is now approved. Medication will be delivered to James [name] on Monday Other? No  Any new allergies since your discharge? No  Dietary orders reviewed? Yes Do you have support at home? Yes   Home Care and Equipment/Supplies: Were home health services ordered? no If so, what is the name of the agency? N/A  Has the agency set up a time to come to the patient's home? not applicable Were any new equipment or medical supplies ordered?  No What is the name of the medical supply agency? N/A Were you able to get the supplies/equipment? not applicable Do you have any questions related to the use of the equipment or supplies? No  Functional Questionnaire: (I = Independent and D = Dependent) ADLs: I  Bathing/Dressing- I  Meal Prep- I  Eating- I  Maintaining continence- I  Transferring/Ambulation- I  Managing Meds- I  Follow up appointments reviewed:  PCP Hospital f/u appt confirmed? No  Patient missed PCP visit while admitted. RNCM assist with rescheduling Hospital follow up-first available appointment a month out, the office will work on getting an earlier appointment and contact James [name] f/u appt confirmed? No   Are transportation arrangements needed? Yes  If their condition worsens, is the pt aware to call PCP or go to the Emergency Dept.? Yes Was the  patient provided with contact information for the PCP's office or ED? Yes Was to pt encouraged to call back with questions or concerns? Yes  RNCM rescheduled missed follow up telephone outreach on 11/18/21 @ 3:30pm.  [name] RN, [place] RN Care Coordinator

## 2021-11-17 ENCOUNTER — Other Ambulatory Visit: Payer: Self-pay

## 2021-11-18 ENCOUNTER — Other Ambulatory Visit: Payer: Self-pay

## 2021-11-18 ENCOUNTER — Encounter: Payer: Self-pay | Admitting: *Deleted

## 2021-11-18 ENCOUNTER — Other Ambulatory Visit: Payer: Medicaid Other | Admitting: *Deleted

## 2021-11-18 NOTE — Patient Outreach (Signed)
Medicaid Managed Care   Nurse Care Manager Note  11/18/2021 Name:  James [name] MRN:  [id number] DOB:  01-03-1961  James [name] is an 61 y.o. year old male who is a primary patient of James Gaskins Burnett Harry, MD.  The [facility] Managed Care Coordination team was consulted for assistance with:    COPD  James [name] was given information about Medicaid Managed Care Coordination team services today. James [name] Patient agreed to services and verbal consent obtained.  Engaged with patient by telephone for follow up visit in response to provider referral for case management and/or care coordination services.   Assessments/Interventions:  Review of past medical history, allergies, medications, health status, including review of consultants reports, laboratory and other test data, was performed as part of comprehensive evaluation and provision of chronic care management services.  SDOH (Social Determinants of Health) assessments and interventions performed: SDOH Interventions    Flowsheet Row Patient Outreach Telephone from 09/09/2021 in [place] Patient Outreach Telephone from 07/08/2021 in [place] Patient Outreach Telephone from 05/15/2021 in [place] Patient Outreach Telephone from 04/15/2021 in [place] Patient Outreach Telephone from 03/16/2021 in [place] Patient Outreach Telephone from 03/02/2021 in [place] Interventions        Food Insecurity Interventions -- -- -- -- Intervention Not Indicated --  Housing Interventions Intervention Not Indicated -- Intervention Not Indicated -- -- --  Transportation Interventions -- Other (Comment)  [Assisted with arranging transportation with [employer] medical transporation and having medications  delivered] -- Other (Comment)  [Assisted with arranging transportation via [place] -- Other (Comment)  [Assisted patient with arranging transportation to PCP on 03/10/21, via [employer]  Alcohol Usage Interventions Intervention Not Indicated (Score <7) -- -- -- -- --       Care Plan  No Known Allergies  Medications Reviewed Today     Reviewed by James Montane, RN (Registered Nurse) on 11/18/21 at 1532  Med List Status: <None>   Medication Order Taking? Sig Documenting Provider Last Dose Status Informant  albuterol (PROVENTIL) (2.5 MG/3ML) 0.083% nebulizer solution [id number] Yes Take 3 mLs (2.5 mg total) by nebulization every 6 (six) hours as needed for wheezing or shortness of breath. James Stain, MD Taking Active Pharmacy Records, Self  albuterol [facility]) [phone] Base) MCG/ACT inhaler [id number] Yes Inhale 2 puffs into the lungs every 4 (four) hours as needed.  Patient taking differently: Inhale 2 puffs into the lungs every 4 (four) hours as needed for wheezing or shortness of breath.   James Stain, MD Taking Active Pharmacy Records, Self  Budeson-Glycopyrrol-Formoterol [facility] AEROSPHERE) 160-9-4.8 MCG/ACT [name] [id number] Yes Inhale 2 puffs into the lungs in the morning and at bedtime. James Hurter, MD Taking Active Pharmacy Records, Self           Med Note (James [name] A   Wed Nov 18, 2021  3:30 PM)    clobetasol cream (TEMOVATE) 0.05 % [id number] Yes Apply 1 Application topically 2 (two) times daily.  Patient taking differently: Apply 1 Application topically 2 (two) times daily as needed (rash).   James Stain, MD Taking Active Pharmacy Records, Self  clotrimazole-betamethasone James [name]) cream [id number] Yes Apply 1 Application topically 2 (two) times daily to groin affected areas  Patient taking differently: Apply 1 Application topically 2 (two) times  daily as needed (rash).   James Stain, MD Taking Active Pharmacy Records,  Self  furosemide (LASIX) 20 MG tablet [id number] Yes Take 1 tablet (20 mg total) by mouth daily as needed for edema. James Stain, MD Taking Active Pharmacy Records, Self  glipiZIDE (GLUCOTROL XL) 5 MG 24 hr tablet [id number] Yes Take 1 tablet (5 mg total) by mouth daily with breakfast. James Stain, MD Taking Active Pharmacy Records, Self  losartan (COZAAR) 50 MG tablet [id number] Yes Take 1 tablet (50 mg total) by mouth daily. James Stain, MD Taking Active Pharmacy Records, Self  metoprolol succinate (TOPROL-XL) 25 MG 24 hr tablet [id number] Yes Take 1 tablet (25 mg total) by mouth daily. James Stain, MD Taking Active Pharmacy Records, Self  pantoprazole (PROTONIX) 40 MG tablet [id number] Yes Take 1 tablet (40 mg total) by mouth daily. James Stain, MD Taking Active Pharmacy Records, Self  predniSONE (DELTASONE) 10 MG tablet [id number]  Take 1 tablet (10 mg total) by mouth daily. James Natal, MD  Active   pregabalin (LYRICA) 100 MG capsule [id number] Yes Take 1 capsule (100 mg total) by mouth 2 (two) times daily. James Stain, MD Taking Active Pharmacy Records, Self  Vitamin D3 (VITAMIN D) 25 MCG tablet [id number] No Take 1 tablet by mouth daily.  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[place]) 10/11/2021   Tinea cruris 07/21/2021   COPD with acute exacerbation ([place]) 07/12/2021   Community acquired pneumonia 07/12/2021   Multiple lung nodules on CT 06/11/2021   COPD with chronic bronchitis 02/01/2021   GERD (gastroesophageal reflux disease) 02/01/2021   Neuropathy 09/29/2020   Bronchiectasis ([place]) 09/24/2019   Type 2 diabetes mellitus without complication, without long-term current use of insulin ([place]) 07/24/2019   Other atopic  dermatitis 05/01/2019   Chronic respiratory failure with hypoxia ([place]) 04/23/2019   Intellectual disability 04/23/2019   Alcohol use disorder 11/23/2018   Hypertension 05/17/2016   Tobacco use disorder 04/06/2016   COPD mixed type ([place]) 03/26/2016   Acute on chronic respiratory failure with hypoxia and hypercapnia ([place]) 06/08/2013    Conditions to be addressed/monitored per PCP order:  COPD  Care Plan : RN Care Manager Plan of Care  Updates made by James Montane, RN since 11/18/2021 12:00 AM     Problem: Chronic Disease Management needs in patient with COPD   Priority: High     Long-Range Goal: Plan of care established for for Chronic Disease management for patient with COPD   Start Date: 11/13/2020  Expected End Date: 02/16/2022  Priority: High  Note:   Current Barriers:  Care Coordination needs related to Limited access to food  Chronic Disease Management support and education needs related to COPD Mr. Stene reports feeling bad today. He has all of his medications, but ran out of prednisone today and is waiting for the prescriptions to be delivered.  RNCM Clinical Goal(s):  Patient will verbalize understanding of plan for management of COPD as evidenced by taking all medications as prescribed, attending all scheduled appointments and contacting provider with questions and concerns take all medications exactly as prescribed and will call  provider for medication related questions as evidenced by refilling medications and taking as prescribed    attend all scheduled medical appointments: 12/08/21 for CT as evidenced by provider notes in EMR        demonstrate improved adherence to prescribed treatment plan for COPD as evidenced by taking all medications as prescribed, attending all scheduled appointments and contacting provider with questions and concerns continue to work with RN Care Manager and/or Social Worker to address care management and care coordination needs related to  COPD as evidenced by adherence to CM Team Scheduled appointments     through collaboration with RN Care manager, provider, and care team.   Interventions: Inter-disciplinary care team collaboration (see longitudinal plan of care) Evaluation of current treatment plan related to  self management and patient's adherence to plan as established by provider Reviewed medications, including refill history Collaborated with [place] for needed medications, requested delivery to patient   SDOH Barriers (Status:  Goal on track:  Yes. Patient interviewed and SDOH assessment performed-RNCM will assist with arranging transportation to PCP appointment on 10/29/21 during next outreach        SDOH Interventions    Flowsheet Row Most Recent Value  SDOH Interventions   Transportation Interventions Other (Comment)  [Assisted with arranging transportation with [employer] medical transporation and having medications delivered]       Patient interviewed and appropriate assessments performed Discussed plans with patient for ongoing care management follow up and provided patient with direct contact information for care management team-   COPD: (Status: Goal on Track (progressing): YES.) Long Term Goal  Reviewed medications with patient, including use of prescribed maintenance and rescue inhalers, and provided instruction on medication management and the importance of adherence Advised patient to self assesses COPD action plan zone and make appointment with provider if in the yellow zone for 48 hours without improvement Provided education about and advised patient to utilize infection prevention strategies to reduce risk of respiratory infection Discussed the importance of adequate rest and management of fatigue with COPD Advised patient to limit time outside with increased pollen and rising temperatures Assisted with contacting pharmacy to inquire about prednisone-pharmacy left a message with  patient inquiring about billing, will send out tomorrow RNCM advised patient to call PCP office today to discuss his symptoms and needing an appointment Discussed the importance of managing symptoms early to avoid hospitalization RNCM will follow up with patient next week to assist with arranging transportation   Patient Goals/Self-Care Activities: Take medications as prescribed   Attend all scheduled provider appointments Call pharmacy for medication refills 3-7 days in advance of running out of medications Perform all self care activities independently  Call provider office for new concerns or questions  - avoid second hand smoke - limit outdoor activity during cold weather - keep follow-up appointments:  10/29/21 with [name] - eat healthy/prescribed diet: DASH - get at least 7 to 8 hours of sleep at night       Follow Up:  Patient agrees to Care Plan and Follow-up.  Plan: The Managed Medicaid care management team will reach out to the patient again over the next 7 days.  Date/time of next scheduled RN care management/care coordination outreach:  11/24/21 @ 12:30pm  [name] RN, BSN [place] RN Care Coordinator

## 2021-11-18 NOTE — Patient Instructions (Signed)
Visit Information  James [name] was given information about Medicaid Managed Care team care coordination services as a part of their Healthy [facility] Medicaid benefit. James [name] verbally consented to engagement with the [facility] Managed Care team.   If you are experiencing a medical emergency, please call 911 or report to your local emergency department or urgent care.   If you have a non-emergency medical problem during routine business hours, please contact your provider's office and ask to speak with a nurse.   For questions related to your Healthy [facility] health plan, please call: [phone] or visit the homepage here: [email or web address]  If you would like to schedule transportation through your Healthy [facility] plan, please call the following number at least 2 days in advance of your appointment: [phone]  For information about your ride after you set it up, call Ride Assist at [phone]. Use this number to activate a Will Call pickup, or if your transportation is late for a scheduled pickup. Use this number, too, if you need to make a change or cancel a previously scheduled reservation.  If you need transportation services right away, call [phone]. The after-hours call center is staffed 24 hours to handle ride assistance and urgent reservation requests (including discharges) 365 days a year. Urgent trips include sick visits, hospital discharge requests and life-sustaining treatment.  Call the [place] at [phone], at any time, 24 hours a day, 7 days a week. If you are in danger or need immediate medical attention call 911.  If you would like help to quit smoking, call 1-800-QUIT-NOW (367)047-8755) OR Espaol: 1-855-Djelo-Ya (2-426-834-1962) o para ms informacin haga clic aqu or Text READY to 200-400 to register via text  James [name],   Please see education materials related to COPD provided as print  materials.   The patient verbalized understanding of instructions, educational materials, and care plan provided today and agreed to receive a mailed copy of patient instructions, educational materials, and care plan.   Telephone follow up appointment with Managed Medicaid care management team member scheduled for:11/24/21 @ 12:30pm  James Joiner RN, BSN [place] RN Care Coordinator   Following is a copy of your plan of care:  Care Plan : RN Care Manager Plan of Care  Updates made by James Montane, RN since 11/18/2021 12:00 AM     Problem: Chronic Disease Management needs in patient with COPD   Priority: High     Long-Range Goal: Plan of care established for for Chronic Disease management for patient with COPD   Start Date: 11/13/2020  Expected End Date: 02/16/2022  Priority: High  Note:   Current Barriers:  Care Coordination needs related to Limited access to food  Chronic Disease Management support and education needs related to COPD James [name] reports feeling bad today. He has all of his medications, but ran out of prednisone today and is waiting for the prescriptions to be delivered.  RNCM Clinical Goal(s):  Patient will verbalize understanding of plan for management of COPD as evidenced by taking all medications as prescribed, attending all scheduled appointments and contacting provider with questions and concerns take all medications exactly as prescribed and will call provider for medication related questions as evidenced by refilling medications and taking as prescribed    attend all scheduled medical appointments: 12/08/21 for CT as evidenced by provider notes in EMR        demonstrate improved adherence to prescribed treatment plan for COPD as  evidenced by taking all medications as prescribed, attending all scheduled appointments and contacting provider with questions and concerns continue to work with RN Care Manager and/or Social Worker to  address care management and care coordination needs related to COPD as evidenced by adherence to CM Team Scheduled appointments     through collaboration with Consulting civil engineer, provider, and care team.   Interventions: Inter-disciplinary care team collaboration (see longitudinal plan of care) Evaluation of current treatment plan related to  self management and patient's adherence to plan as established by provider Reviewed medications, including refill history Collaborated with [place] for needed medications, requested delivery to patient   SDOH Barriers (Status:  Goal on track:  Yes. Patient interviewed and SDOH assessment performed-RNCM will assist with arranging transportation to PCP appointment on 10/29/21 during next outreach        SDOH Interventions    Flowsheet Row Most Recent Value  SDOH Interventions   Transportation Interventions Other (Comment)  [Assisted with arranging transportation with [employer] medical transporation and having medications delivered]       Patient interviewed and appropriate assessments performed Discussed plans with patient for ongoing care management follow up and provided patient with direct contact information for care management team-   COPD: (Status: Goal on Track (progressing): YES.) Long Term Goal  Reviewed medications with patient, including use of prescribed maintenance and rescue inhalers, and provided instruction on medication management and the importance of adherence Advised patient to self assesses COPD action plan zone and make appointment with provider if in the yellow zone for 48 hours without improvement Provided education about and advised patient to utilize infection prevention strategies to reduce risk of respiratory infection Discussed the importance of adequate rest and management of fatigue with COPD Advised patient to limit time outside with increased pollen and rising temperatures Assisted with contacting  pharmacy to inquire about prednisone-pharmacy left a message with patient inquiring about billing, will send out tomorrow RNCM advised patient to call PCP office today to discuss his symptoms and needing an appointment Discussed the importance of managing symptoms early to avoid hospitalization RNCM will follow up with patient next week to assist with arranging transportation   Patient Goals/Self-Care Activities: Take medications as prescribed   Attend all scheduled provider appointments Call pharmacy for medication refills 3-7 days in advance of running out of medications Perform all self care activities independently  Call provider office for new concerns or questions  - avoid second hand smoke - limit outdoor activity during cold weather - keep follow-up appointments:  10/29/21 with [name] - eat healthy/prescribed diet: DASH - get at least 7 to 8 hours of sleep at night

## 2021-11-19 ENCOUNTER — Telehealth: Payer: Self-pay

## 2021-11-19 NOTE — Telephone Encounter (Signed)
I received a message from [name], [place] yesterday afternoon stating  that she spoke with James [name] and he ran out of prednisone today( 11/15/) . She  called the pharmacy and it should go out tomorrow. He told her feels like he is getting pneumonia again, he just wasn't feeling good.    I called the patient this morning and he said he is feeling a bit better.  He is waiting for delivery of his prednisone.  I explained to him that [facility] [place] today to be assessed but he said he didn't need to go.  He said he understands the need to seek immediate medical attention if his breathing gets worse.   I

## 2021-11-19 NOTE — Telephone Encounter (Signed)
noted 

## 2021-11-19 NOTE — Patient Outreach (Signed)
error 

## 2021-11-20 ENCOUNTER — Other Ambulatory Visit: Payer: Self-pay

## 2021-11-24 ENCOUNTER — Other Ambulatory Visit: Payer: Self-pay

## 2021-11-24 ENCOUNTER — Other Ambulatory Visit (HOSPITAL_COMMUNITY): Payer: Self-pay

## 2021-11-24 ENCOUNTER — Other Ambulatory Visit: Payer: Medicaid Other | Admitting: *Deleted

## 2021-11-24 NOTE — Patient Outreach (Signed)
Medicaid Managed Care   Nurse Care Manager Note  11/24/2021 Name:  James [name] MRN:  [id number] DOB:  08/18/1960  James [name] is an 61 y.o. year old male who is a primary patient of James Gaskins Burnett Harry, MD.  The [facility] Managed Care Coordination team was consulted for assistance with:    COPD  James [name] was given information about Medicaid Managed Care Coordination team services today. James [name] Patient agreed to services and verbal consent obtained.  Engaged with patient by telephone for follow up visit in response to provider referral for case management and/or care coordination services.   Assessments/Interventions:  Review of past medical history, allergies, medications, health status, including review of consultants reports, laboratory and other test data, was performed as part of comprehensive evaluation and provision of chronic care management services.  SDOH (Social Determinants of Health) assessments and interventions performed: SDOH Interventions    Flowsheet Row Patient Outreach Telephone from 09/09/2021 in [place] Patient Outreach Telephone from 07/08/2021 in [place] Patient Outreach Telephone from 05/15/2021 in [place] Patient Outreach Telephone from 04/15/2021 in [place] Patient Outreach Telephone from 03/16/2021 in [place] Patient Outreach Telephone from 03/02/2021 in [place] Interventions        Food Insecurity Interventions -- -- -- -- Intervention Not Indicated --  Housing Interventions Intervention Not Indicated -- Intervention Not Indicated -- -- --  Transportation Interventions -- Other (Comment)  [Assisted with arranging transportation with [employer] medical transporation and having medications  delivered] -- Other (Comment)  [Assisted with arranging transportation via [place] -- Other (Comment)  [Assisted patient with arranging transportation to PCP on 03/10/21, via [employer]  Alcohol Usage Interventions Intervention Not Indicated (Score <7) -- -- -- -- --       Care Plan  No Known Allergies  Medications Reviewed Today     Reviewed by James Montane, RN (Registered Nurse) on 11/18/21 at 1532  Med List Status: <None>   Medication Order Taking? Sig Documenting Provider Last Dose Status Informant  albuterol (PROVENTIL) (2.5 MG/3ML) 0.083% nebulizer solution [id number] Yes Take 3 mLs (2.5 mg total) by nebulization every 6 (six) hours as needed for wheezing or shortness of breath. James Stain, MD Taking Active Pharmacy Records, Self  albuterol [facility]) 108 [id number] Base) MCG/ACT inhaler [id number] Yes Inhale 2 puffs into the lungs every 4 (four) hours as needed.  Patient taking differently: Inhale 2 puffs into the lungs every 4 (four) hours as needed for wheezing or shortness of breath.   James Stain, MD Taking Active Pharmacy Records, Self  Budeson-Glycopyrrol-Formoterol [facility] AEROSPHERE) 160-9-4.8 MCG/ACT James [name] [id number] Yes Inhale 2 puffs into the lungs in the morning and at bedtime. James Hurter, MD Taking Active Pharmacy Records, Self           Med Note (Leland Staszewski A   Wed Nov 18, 2021  3:30 PM)    clobetasol cream (TEMOVATE) 0.05 % [id number] Yes Apply 1 Application topically 2 (two) times daily.  Patient taking differently: Apply 1 Application topically 2 (two) times daily as needed (rash).   James Stain, MD Taking Active Pharmacy Records, Self  clotrimazole-betamethasone Donalynn Furlong) cream [id number] Yes Apply 1 Application topically 2 (two) times daily to groin affected areas  Patient taking differently: Apply 1 Application topically 2 (two) times  daily as needed (rash).   James Stain, MD Taking Active Pharmacy Records,  Self  furosemide (LASIX) 20 MG tablet [id number] Yes Take 1 tablet (20 mg total) by mouth daily as needed for edema. James Stain, MD Taking Active Pharmacy Records, Self  glipiZIDE (GLUCOTROL XL) 5 MG 24 hr tablet [id number] Yes Take 1 tablet (5 mg total) by mouth daily with breakfast. James Stain, MD Taking Active Pharmacy Records, Self  losartan (COZAAR) 50 MG tablet [id number] Yes Take 1 tablet (50 mg total) by mouth daily. James Stain, MD Taking Active Pharmacy Records, Self  metoprolol succinate (TOPROL-XL) 25 MG 24 hr tablet [id number] Yes Take 1 tablet (25 mg total) by mouth daily. James Stain, MD Taking Active Pharmacy Records, Self  pantoprazole (PROTONIX) 40 MG tablet [id number] Yes Take 1 tablet (40 mg total) by mouth daily. James Stain, MD Taking Active Pharmacy Records, Self  predniSONE (DELTASONE) 10 MG tablet [id number]  Take 1 tablet (10 mg total) by mouth daily. James Natal, MD  Active   pregabalin (LYRICA) 100 MG capsule [id number] Yes Take 1 capsule (100 mg total) by mouth 2 (two) times daily. James Stain, MD Taking Active Pharmacy Records, Self  Vitamin D3 (VITAMIN D) 25 MCG tablet [id number] No Take 1 tablet by mouth daily.  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[place]) 10/11/2021   Tinea cruris 07/21/2021   COPD with acute exacerbation ([place]) 07/12/2021   Community acquired pneumonia 07/12/2021   Multiple lung nodules on CT 06/11/2021   COPD with chronic bronchitis 02/01/2021   GERD (gastroesophageal reflux disease) 02/01/2021   Neuropathy 09/29/2020   Bronchiectasis ([place]) 09/24/2019   Type 2 diabetes mellitus without complication, without long-term current use of insulin ([place]) 07/24/2019   Other atopic  dermatitis 05/01/2019   Chronic respiratory failure with hypoxia ([place]) 04/23/2019   Intellectual disability 04/23/2019   Alcohol use disorder 11/23/2018   Hypertension 05/17/2016   Tobacco use disorder 04/06/2016   COPD mixed type ([place]) 03/26/2016   Acute on chronic respiratory failure with hypoxia and hypercapnia ([place]) 06/08/2013    Conditions to be addressed/monitored per PCP order:  COPD  Care Plan : RN Care Manager Plan of Care  Updates made by James Montane, RN since 11/24/2021 12:00 AM     Problem: Chronic Disease Management needs in patient with COPD   Priority: High     Long-Range Goal: Plan of care established for for Chronic Disease management for patient with COPD   Start Date: 11/13/2020  Expected End Date: 02/16/2022  Priority: High  Note:   Current Barriers:  Care Coordination needs related to Limited access to food  Chronic Disease Management support and education needs related to COPD Mr. Haggar reports feeling bad today. He has all of his medications, but ran out of prednisone today and is waiting for the prescriptions to be delivered.  RNCM Clinical Goal(s):  Patient will verbalize understanding of plan for management of COPD as evidenced by taking all medications as prescribed, attending all scheduled appointments and contacting provider with questions and concerns take all medications exactly as prescribed and will call  provider for medication related questions as evidenced by refilling medications and taking as prescribed    attend all scheduled medical appointments: 12/08/21 for CT as evidenced by provider notes in EMR        demonstrate improved adherence to prescribed treatment plan for COPD as evidenced by taking all medications as prescribed, attending all scheduled appointments and contacting provider with questions and concerns continue to work with RN Care Manager and/or Social Worker to address care management and care coordination needs related to  COPD as evidenced by adherence to CM Team Scheduled appointments     through collaboration with RN Care manager, provider, and care team.   Interventions: Inter-disciplinary care team collaboration (see longitudinal plan of care) Evaluation of current treatment plan related to  self management and patient's adherence to plan as established by provider   SDOH Barriers (Status:  Goal on track:  Yes. Patient interviewed and SDOH assessment performed        SDOH Interventions    Flowsheet Row Most Recent Value  SDOH Interventions   Transportation Interventions Other (Comment)  [Assisted with arranging transportation with [employer] medical transporation and having medications delivered]       Patient interviewed and appropriate assessments performed Discussed plans with patient for ongoing care management follow up and provided patient with direct contact information for care management team-   COPD: (Status: Goal on Track (progressing): YES.) Long Term Goal  Reviewed medications with patient, including use of prescribed maintenance and rescue inhalers, and provided instruction on medication management and the importance of adherence Advised patient to self assesses COPD action plan zone and make appointment with provider if in the yellow zone for 48 hours without improvement Provided education about and advised patient to utilize infection prevention strategies to reduce risk of respiratory infection Discussed the importance of adequate rest and management of fatigue with COPD Advised patient to limit time outside with increased pollen and rising temperatures Assisted with arranging transportation to CT scan on 12/08/21, pick up between 10:45 and 11:15am, return pick up at 1:30pm   Patient Goals/Self-Care Activities: Take medications as prescribed   Attend all scheduled provider appointments Call pharmacy for medication refills 3-7 days in advance of running out of  medications Perform all self care activities independently  Call provider office for new concerns or questions  - avoid second hand smoke - limit outdoor activity during cold weather - keep follow-up appointments:  10/29/21 with [name] - eat healthy/prescribed diet: DASH - get at least 7 to 8 hours of sleep at night       Follow Up:  Patient agrees to Care Plan and Follow-up.  Plan: The Managed Medicaid care management team will reach out to the patient again over the next 20 days.  Date/time of next scheduled RN care management/care coordination outreach:  12/17/21 @ 10:30am  [name] RN, BSN [place] RN Care Coordinator

## 2021-11-24 NOTE — Patient Instructions (Signed)
Visit Information  [name] was given information about Medicaid Managed Care team care coordination services as a part of their Healthy [facility] Medicaid benefit. James [name] verbally consented to engagement with the [facility] Managed Care team.   If you are experiencing a medical emergency, please call 911 or report to your local emergency department or urgent care.   If you have a non-emergency medical problem during routine business hours, please contact your provider's office and ask to speak with a nurse.   For questions related to your Healthy [facility] health plan, please call: [phone] or visit the homepage here: [email or web address]  If you would like to schedule transportation through your Healthy [facility] plan, please call the following number at least 2 days in advance of your appointment: [phone]  For information about your ride after you set it up, call Ride Assist at [phone]. Use this number to activate a Will Call pickup, or if your transportation is late for a scheduled pickup. Use this number, too, if you need to make a change or cancel a previously scheduled reservation.  If you need transportation services right away, call [phone]. The after-hours call center is staffed 24 hours to handle ride assistance and urgent reservation requests (including discharges) 365 days a year. Urgent trips include sick visits, hospital discharge requests and life-sustaining treatment.  Call the [place] at [phone], at any time, 24 hours a day, 7 days a week. If you are in danger or need immediate medical attention call 911.  If you would like help to quit smoking, call 1-800-QUIT-NOW (787)717-8175) OR Espaol: 1-855-Djelo-Ya (0-263-785-8850) o para ms informacin haga clic aqu or Text READY to 200-400 to register via text  James [name],   Please see education materials related to COPD provided as print  materials.   The patient verbalized understanding of instructions, educational materials, and care plan provided today and agreed to receive a mailed copy of patient instructions, educational materials, and care plan.   Telephone follow up appointment with Managed Medicaid care management team member scheduled for:12/17/21 @ 10:30am  [name] RN, BSN [place] RN Care Coordinator   Following is a copy of your plan of care:  Care Plan : RN Care Manager Plan of Care  Updates made by [name], RN since 11/24/2021 12:00 AM     Problem: Chronic Disease Management needs in patient with COPD   Priority: High     Long-Range Goal: Plan of care established for for Chronic Disease management for patient with COPD   Start Date: 11/13/2020  Expected End Date: 02/16/2022  Priority: High  Note:   Current Barriers:  Care Coordination needs related to Limited access to food  Chronic Disease Management support and education needs related to COPD James [name] reports feeling bad today. He has all of his medications, but ran out of prednisone today and is waiting for the prescriptions to be delivered.  RNCM Clinical Goal(s):  Patient will verbalize understanding of plan for management of COPD as evidenced by taking all medications as prescribed, attending all scheduled appointments and contacting provider with questions and concerns take all medications exactly as prescribed and will call provider for medication related questions as evidenced by refilling medications and taking as prescribed    attend all scheduled medical appointments: 12/08/21 for CT as evidenced by provider notes in EMR        demonstrate improved adherence to prescribed treatment plan for COPD as  evidenced by taking all medications as prescribed, attending all scheduled appointments and contacting provider with questions and concerns continue to work with RN Care Manager and/or Social Worker to  address care management and care coordination needs related to COPD as evidenced by adherence to CM Team Scheduled appointments     through collaboration with RN Care manager, provider, and care team.   Interventions: Inter-disciplinary care team collaboration (see longitudinal plan of care) Evaluation of current treatment plan related to  self management and patient's adherence to plan as established by provider   SDOH Barriers (Status:  Goal on track:  Yes. Patient interviewed and SDOH assessment performed        SDOH Interventions    Flowsheet Row Most Recent Value  SDOH Interventions   Transportation Interventions Other (Comment)  [Assisted with arranging transportation with [employer] medical transporation and having medications delivered]       Patient interviewed and appropriate assessments performed Discussed plans with patient for ongoing care management follow up and provided patient with direct contact information for care management team-   COPD: (Status: Goal on Track (progressing): YES.) Long Term Goal  Reviewed medications with patient, including use of prescribed maintenance and rescue inhalers, and provided instruction on medication management and the importance of adherence Advised patient to self assesses COPD action plan zone and make appointment with provider if in the yellow zone for 48 hours without improvement Provided education about and advised patient to utilize infection prevention strategies to reduce risk of respiratory infection Discussed the importance of adequate rest and management of fatigue with COPD Advised patient to limit time outside with increased pollen and rising temperatures Assisted with arranging transportation to CT scan on 12/08/21, pick up between 10:45 and 11:15am, return pick up at 1:30pm   Patient Goals/Self-Care Activities: Take medications as prescribed   Attend all scheduled provider appointments Call pharmacy for medication  refills 3-7 days in advance of running out of medications Perform all self care activities independently  Call provider office for new concerns or questions  - avoid second hand smoke - limit outdoor activity during cold weather - keep follow-up appointments:  10/29/21 with [name] - eat healthy/prescribed diet: DASH - get at least 7 to 8 hours of sleep at night

## 2021-11-25 ENCOUNTER — Other Ambulatory Visit (HOSPITAL_BASED_OUTPATIENT_CLINIC_OR_DEPARTMENT_OTHER): Payer: Self-pay

## 2021-11-25 ENCOUNTER — Other Ambulatory Visit: Payer: Self-pay

## 2021-11-25 ENCOUNTER — Other Ambulatory Visit (HOSPITAL_COMMUNITY): Payer: Self-pay

## 2021-11-27 DIAGNOSIS — R062 Wheezing: Secondary | ICD-10-CM | POA: Diagnosis not present

## 2021-11-27 DIAGNOSIS — R0602 Shortness of breath: Secondary | ICD-10-CM | POA: Diagnosis not present

## 2021-11-27 DIAGNOSIS — R0689 Other abnormalities of breathing: Secondary | ICD-10-CM | POA: Diagnosis not present

## 2021-11-27 DIAGNOSIS — R000 Tachycardia, unspecified: Secondary | ICD-10-CM | POA: Diagnosis not present

## 2021-11-27 DIAGNOSIS — I4891 Unspecified atrial fibrillation: Secondary | ICD-10-CM | POA: Diagnosis not present

## 2021-11-28 ENCOUNTER — Emergency Department (HOSPITAL_COMMUNITY): Payer: Medicaid Other

## 2021-11-28 ENCOUNTER — Inpatient Hospital Stay (HOSPITAL_COMMUNITY)
Admission: EM | Admit: 2021-11-28 | Discharge: 2021-12-08 | DRG: 871 | Disposition: A | Discharge status: Home / Self Care | Payer: Medicaid Other | Attending: Internal Medicine | Admitting: Internal Medicine

## 2021-11-28 ENCOUNTER — Other Ambulatory Visit (HOSPITAL_COMMUNITY): Payer: Medicaid Other

## 2021-11-28 ENCOUNTER — Other Ambulatory Visit: Payer: Self-pay

## 2021-11-28 DIAGNOSIS — N179 Acute kidney failure, unspecified: Secondary | ICD-10-CM | POA: Diagnosis not present

## 2021-11-28 DIAGNOSIS — J9601 Acute respiratory failure with hypoxia: Secondary | ICD-10-CM | POA: Diagnosis not present

## 2021-11-28 DIAGNOSIS — R6521 Severe sepsis with septic shock: Secondary | ICD-10-CM | POA: Diagnosis not present

## 2021-11-28 DIAGNOSIS — Z87891 Personal history of nicotine dependence: Secondary | ICD-10-CM

## 2021-11-28 DIAGNOSIS — Z9981 Dependence on supplemental oxygen: Secondary | ICD-10-CM | POA: Diagnosis not present

## 2021-11-28 DIAGNOSIS — J441 Chronic obstructive pulmonary disease with (acute) exacerbation: Secondary | ICD-10-CM | POA: Diagnosis not present

## 2021-11-28 DIAGNOSIS — T4275XA Adverse effect of unspecified antiepileptic and sedative-hypnotic drugs, initial encounter: Secondary | ICD-10-CM | POA: Diagnosis not present

## 2021-11-28 DIAGNOSIS — J9621 Acute and chronic respiratory failure with hypoxia: Secondary | ICD-10-CM | POA: Diagnosis not present

## 2021-11-28 DIAGNOSIS — J9622 Acute and chronic respiratory failure with hypercapnia: Secondary | ICD-10-CM | POA: Diagnosis not present

## 2021-11-28 DIAGNOSIS — I700 Atherosclerosis of aorta: Secondary | ICD-10-CM | POA: Diagnosis not present

## 2021-11-28 DIAGNOSIS — Z902 Acquired absence of lung [part of]: Secondary | ICD-10-CM

## 2021-11-28 DIAGNOSIS — J439 Emphysema, unspecified: Secondary | ICD-10-CM | POA: Diagnosis present

## 2021-11-28 DIAGNOSIS — I110 Hypertensive heart disease with heart failure: Secondary | ICD-10-CM | POA: Diagnosis present

## 2021-11-28 DIAGNOSIS — Z7951 Long term (current) use of inhaled steroids: Secondary | ICD-10-CM

## 2021-11-28 DIAGNOSIS — E440 Moderate protein-calorie malnutrition: Secondary | ICD-10-CM | POA: Diagnosis not present

## 2021-11-28 DIAGNOSIS — L299 Pruritus, unspecified: Secondary | ICD-10-CM | POA: Diagnosis not present

## 2021-11-28 DIAGNOSIS — I2510 Atherosclerotic heart disease of native coronary artery without angina pectoris: Secondary | ICD-10-CM | POA: Diagnosis present

## 2021-11-28 DIAGNOSIS — I4719 Other supraventricular tachycardia: Secondary | ICD-10-CM | POA: Diagnosis not present

## 2021-11-28 DIAGNOSIS — Z7984 Long term (current) use of oral hypoglycemic drugs: Secondary | ICD-10-CM

## 2021-11-28 DIAGNOSIS — Z6831 Body mass index (BMI) 31.0-31.9, adult: Secondary | ICD-10-CM

## 2021-11-28 DIAGNOSIS — I10 Essential (primary) hypertension: Secondary | ICD-10-CM | POA: Diagnosis not present

## 2021-11-28 DIAGNOSIS — R7989 Other specified abnormal findings of blood chemistry: Secondary | ICD-10-CM | POA: Diagnosis not present

## 2021-11-28 DIAGNOSIS — I214 Non-ST elevation (NSTEMI) myocardial infarction: Secondary | ICD-10-CM | POA: Insufficient documentation

## 2021-11-28 DIAGNOSIS — E785 Hyperlipidemia, unspecified: Secondary | ICD-10-CM | POA: Diagnosis present

## 2021-11-28 DIAGNOSIS — J9602 Acute respiratory failure with hypercapnia: Secondary | ICD-10-CM | POA: Diagnosis not present

## 2021-11-28 DIAGNOSIS — E875 Hyperkalemia: Secondary | ICD-10-CM | POA: Diagnosis not present

## 2021-11-28 DIAGNOSIS — Z9181 History of falling: Secondary | ICD-10-CM

## 2021-11-28 DIAGNOSIS — R0602 Shortness of breath: Secondary | ICD-10-CM | POA: Diagnosis not present

## 2021-11-28 DIAGNOSIS — Z8616 Personal history of COVID-19: Secondary | ICD-10-CM | POA: Diagnosis not present

## 2021-11-28 DIAGNOSIS — B9789 Other viral agents as the cause of diseases classified elsewhere: Secondary | ICD-10-CM | POA: Diagnosis present

## 2021-11-28 DIAGNOSIS — I21A1 Myocardial infarction type 2: Secondary | ICD-10-CM | POA: Diagnosis present

## 2021-11-28 DIAGNOSIS — Z825 Family history of asthma and other chronic lower respiratory diseases: Secondary | ICD-10-CM

## 2021-11-28 DIAGNOSIS — B9710 Unspecified enterovirus as the cause of diseases classified elsewhere: Secondary | ICD-10-CM | POA: Diagnosis present

## 2021-11-28 DIAGNOSIS — T380X5A Adverse effect of glucocorticoids and synthetic analogues, initial encounter: Secondary | ICD-10-CM | POA: Diagnosis present

## 2021-11-28 DIAGNOSIS — A419 Sepsis, unspecified organism: Secondary | ICD-10-CM | POA: Diagnosis not present

## 2021-11-28 DIAGNOSIS — I480 Paroxysmal atrial fibrillation: Secondary | ICD-10-CM | POA: Diagnosis not present

## 2021-11-28 DIAGNOSIS — I952 Hypotension due to drugs: Secondary | ICD-10-CM | POA: Diagnosis not present

## 2021-11-28 DIAGNOSIS — R52 Pain, unspecified: Secondary | ICD-10-CM | POA: Diagnosis not present

## 2021-11-28 DIAGNOSIS — I5031 Acute diastolic (congestive) heart failure: Secondary | ICD-10-CM | POA: Diagnosis not present

## 2021-11-28 DIAGNOSIS — J9690 Respiratory failure, unspecified, unspecified whether with hypoxia or hypercapnia: Secondary | ICD-10-CM | POA: Diagnosis present

## 2021-11-28 DIAGNOSIS — J440 Chronic obstructive pulmonary disease with acute lower respiratory infection: Secondary | ICD-10-CM | POA: Diagnosis not present

## 2021-11-28 DIAGNOSIS — Z8249 Family history of ischemic heart disease and other diseases of the circulatory system: Secondary | ICD-10-CM

## 2021-11-28 DIAGNOSIS — R21 Rash and other nonspecific skin eruption: Secondary | ICD-10-CM | POA: Diagnosis not present

## 2021-11-28 DIAGNOSIS — E669 Obesity, unspecified: Secondary | ICD-10-CM | POA: Diagnosis present

## 2021-11-28 DIAGNOSIS — E1165 Type 2 diabetes mellitus with hyperglycemia: Secondary | ICD-10-CM | POA: Diagnosis present

## 2021-11-28 DIAGNOSIS — I161 Hypertensive emergency: Secondary | ICD-10-CM | POA: Diagnosis present

## 2021-11-28 DIAGNOSIS — B348 Other viral infections of unspecified site: Secondary | ICD-10-CM | POA: Diagnosis not present

## 2021-11-28 DIAGNOSIS — F419 Anxiety disorder, unspecified: Secondary | ICD-10-CM | POA: Diagnosis present

## 2021-11-28 DIAGNOSIS — J159 Unspecified bacterial pneumonia: Secondary | ICD-10-CM | POA: Diagnosis not present

## 2021-11-28 DIAGNOSIS — Z8701 Personal history of pneumonia (recurrent): Secondary | ICD-10-CM

## 2021-11-28 DIAGNOSIS — Z79899 Other long term (current) drug therapy: Secondary | ICD-10-CM

## 2021-11-28 DIAGNOSIS — Z7952 Long term (current) use of systemic steroids: Secondary | ICD-10-CM

## 2021-11-28 DIAGNOSIS — J80 Acute respiratory distress syndrome: Secondary | ICD-10-CM | POA: Diagnosis not present

## 2021-11-28 DIAGNOSIS — Z20822 Contact with and (suspected) exposure to covid-19: Secondary | ICD-10-CM | POA: Diagnosis not present

## 2021-11-28 DIAGNOSIS — I4891 Unspecified atrial fibrillation: Secondary | ICD-10-CM | POA: Diagnosis not present

## 2021-11-28 DIAGNOSIS — R0689 Other abnormalities of breathing: Secondary | ICD-10-CM | POA: Diagnosis not present

## 2021-11-28 DIAGNOSIS — I499 Cardiac arrhythmia, unspecified: Secondary | ICD-10-CM | POA: Diagnosis not present

## 2021-11-28 LAB — RESPIRATORY PANEL BY PCR

## 2021-11-28 LAB — BASIC METABOLIC PANEL
Anion gap: 10 (ref 5–15)
BUN: 20 mg/dL (ref 6–20)
CO2: 27 mmol/L (ref 22–32)
Calcium: 9.1 mg/dL (ref 8.9–10.3)
Chloride: 102 mmol/L (ref 98–111)
Creatinine, Ser: 1.13 mg/dL (ref 0.61–1.24)
GFR, Estimated: >60 mL/min (ref >60)
Glucose, Bld: 239 mg/dL — ABNORMAL HIGH (ref 70–99)
Potassium: 5.2 mmol/L — ABNORMAL HIGH (ref 3.5–5.1)
Sodium: 139 mmol/L (ref 135–145)

## 2021-11-28 LAB — I-STAT ARTERIAL BLOOD GAS, ED
Acid-Base Excess: 0 mmol/L (ref 0.0–2.0)
Acid-Base Excess: 0 mmol/L (ref 0.0–2.0)
Acid-base deficit: 1 mmol/L (ref 0.0–2.0)
Bicarbonate: 29.7 mmol/L — ABNORMAL HIGH (ref 20.0–28.0)
Bicarbonate: 28 mmol/L (ref 20.0–28.0)
Bicarbonate: 28.7 mmol/L — ABNORMAL HIGH (ref 20.0–28.0)
Calcium, Ion: 1.31 mmol/L (ref 1.15–1.40)
Calcium, Ion: 1.21 mmol/L (ref 1.15–1.40)
Calcium, Ion: 1.24 mmol/L (ref 1.15–1.40)
HCT: 46 % (ref 39.0–52.0)
HCT: 38 % — ABNORMAL LOW (ref 39.0–52.0)
HCT: 40 % (ref 39.0–52.0)
Hemoglobin: 15.6 g/dL (ref 13.0–17.0)
Hemoglobin: 12.9 g/dL — ABNORMAL LOW (ref 13.0–17.0)
Hemoglobin: 13.6 g/dL (ref 13.0–17.0)
O2 Saturation: 93 %
O2 Saturation: 95 %
O2 Saturation: 99 %
Patient temperature: 99
Patient temperature: 100.7
Potassium: 4.9 mmol/L (ref 3.5–5.1)
Potassium: 5.2 mmol/L — ABNORMAL HIGH (ref 3.5–5.1)
Potassium: 5.3 mmol/L — ABNORMAL HIGH (ref 3.5–5.1)
Sodium: 138 mmol/L (ref 135–145)
Sodium: 139 mmol/L (ref 135–145)
Sodium: 139 mmol/L (ref 135–145)
TCO2: 32 mmol/L (ref 22–32)
TCO2: 30 mmol/L (ref 22–32)
TCO2: 31 mmol/L (ref 22–32)
pCO2 arterial: 70.1 mmHg (ref 32–48)
pCO2 arterial: 63.6 mmHg — ABNORMAL HIGH (ref 32–48)
pCO2 arterial: 72.8 mmHg (ref 32–48)
pH, Arterial: 7.236 — ABNORMAL LOW (ref 7.35–7.45)
pH, Arterial: 7.204 — ABNORMAL LOW (ref 7.35–7.45)
pH, Arterial: 7.258 — ABNORMAL LOW (ref 7.35–7.45)
pO2, Arterial: 83 mmHg (ref 83–108)
pO2, Arterial: 208 mmHg — ABNORMAL HIGH (ref 83–108)
pO2, Arterial: 93 mmHg (ref 83–108)

## 2021-11-28 LAB — CBC WITH DIFFERENTIAL/PLATELET
Abs Immature Granulocytes: 0.14 10*3/uL — ABNORMAL HIGH (ref 0.00–0.07)
Basophils Absolute: 0.1 10*3/uL (ref 0.0–0.1)
Basophils Relative: 0 %
Eosinophils Absolute: 0.2 10*3/uL (ref 0.0–0.5)
Eosinophils Relative: 1 %
HCT: 48.4 % (ref 39.0–52.0)
Hemoglobin: 15.6 g/dL (ref 13.0–17.0)
Immature Granulocytes: 1 %
Lymphocytes Relative: 20 %
Lymphs Abs: 4.8 10*3/uL — ABNORMAL HIGH (ref 0.7–4.0)
MCH: 29.9 pg (ref 26.0–34.0)
MCHC: 32.2 g/dL (ref 30.0–36.0)
MCV: 92.7 fL (ref 80.0–100.0)
Monocytes Absolute: 1.3 10*3/uL — ABNORMAL HIGH (ref 0.1–1.0)
Monocytes Relative: 5 %
Neutro Abs: 18 10*3/uL — ABNORMAL HIGH (ref 1.7–7.7)
Neutrophils Relative %: 73 %
Platelets: 429 10*3/uL — ABNORMAL HIGH (ref 150–400)
RBC: 5.22 MIL/uL (ref 4.22–5.81)
RDW: 14.8 % (ref 11.5–15.5)
WBC: 24.6 10*3/uL — ABNORMAL HIGH (ref 4.0–10.5)
nRBC: 0 % (ref 0.0–0.2)

## 2021-11-28 LAB — RAPID URINE DRUG SCREEN, HOSP PERFORMED
Amphetamines: NOT DETECTED
Barbiturates: NOT DETECTED
Benzodiazepines: NOT DETECTED
Cocaine: NOT DETECTED
Opiates: NOT DETECTED
Tetrahydrocannabinol: NOT DETECTED

## 2021-11-28 LAB — LACTIC ACID, PLASMA: Lactic Acid, Venous: 1.7 mmol/L (ref 0.5–1.9)

## 2021-11-28 LAB — RESP PANEL BY RT-PCR (FLU A&B, COVID) ARPGX2
Influenza A by PCR: NEGATIVE
Influenza B by PCR: NEGATIVE
SARS Coronavirus 2 by RT PCR: NEGATIVE

## 2021-11-28 LAB — GLUCOSE, CAPILLARY
Glucose-Capillary: 143 mg/dL — ABNORMAL HIGH (ref 70–99)
Glucose-Capillary: 151 mg/dL — ABNORMAL HIGH (ref 70–99)
Glucose-Capillary: 156 mg/dL — ABNORMAL HIGH (ref 70–99)

## 2021-11-28 LAB — CBG MONITORING, ED
Glucose-Capillary: 212 mg/dL — ABNORMAL HIGH (ref 70–99)
Glucose-Capillary: 145 mg/dL — ABNORMAL HIGH (ref 70–99)

## 2021-11-28 LAB — TROPONIN I (HIGH SENSITIVITY)
Troponin I (High Sensitivity): 20 ng/L — ABNORMAL HIGH (ref <18)
Troponin I (High Sensitivity): 467 ng/L (ref <18)
Troponin I (High Sensitivity): 1676 ng/L (ref <18)
Troponin I (High Sensitivity): 1951 ng/L (ref <18)

## 2021-11-28 LAB — STREP PNEUMONIAE URINARY ANTIGEN: Strep Pneumo Urinary Antigen: NEGATIVE

## 2021-11-28 LAB — BRAIN NATRIURETIC PEPTIDE: B Natriuretic Peptide: 50.2 pg/mL (ref 0.0–100.0)

## 2021-11-28 MED ORDER — PROPOFOL 1000 MG/100ML IV EMUL: 5.0000 ug/kg/min | INTRAVENOUS | Status: DC

## 2021-11-28 MED ORDER — CHLORHEXIDINE GLUCONATE CLOTH 2 % EX PADS
6.0000 | MEDICATED_PAD | Freq: Every day | CUTANEOUS | Status: DC
  Administered 2021-11-28 – 2021-12-06 (×7): 6 via TOPICAL

## 2021-11-28 MED ORDER — ORAL CARE MOUTH RINSE
15.0000 mL | OROMUCOSAL | Status: DC
  Administered 2021-11-28 – 2021-12-01 (×32): 15 mL via OROMUCOSAL

## 2021-11-28 MED ORDER — ETOMIDATE 2 MG/ML IV SOLN
INTRAVENOUS | Status: AC
  Filled 2021-11-28: qty 20

## 2021-11-28 MED ORDER — SODIUM CHLORIDE 0.9 % IV SOLN: INTRAVENOUS | Status: DC | PRN

## 2021-11-28 MED ORDER — LACTATED RINGERS IV BOLUS
1000.0000 mL | Freq: Once | INTRAVENOUS | Status: AC
  Administered 2021-11-28: 1000 mL via INTRAVENOUS

## 2021-11-28 MED ORDER — ETOMIDATE 2 MG/ML IV SOLN: 20.0000 mg | Freq: Once | INTRAVENOUS | Status: AC

## 2021-11-28 MED ORDER — SODIUM CHLORIDE 0.9 % IV SOLN
250.0000 mL | INTRAVENOUS | Status: DC
  Administered 2021-11-28: 250 mL via INTRAVENOUS

## 2021-11-28 MED ORDER — ONDANSETRON HCL 4 MG/2ML IJ SOLN
INTRAMUSCULAR | Status: AC
  Filled 2021-11-28: qty 2

## 2021-11-28 MED ORDER — ONDANSETRON HCL 4 MG/2ML IJ SOLN: 4.0000 mg | Freq: Four times a day (QID) | INTRAMUSCULAR | Status: DC | PRN

## 2021-11-28 MED ORDER — METHYLPREDNISOLONE SODIUM SUCC 40 MG IJ SOLR
40.0000 mg | Freq: Two times a day (BID) | INTRAMUSCULAR | Status: DC
  Administered 2021-11-28 – 2021-12-02 (×9): 40 mg via INTRAVENOUS
  Filled 2021-11-28 (×9): qty 1

## 2021-11-28 MED ORDER — ORAL CARE MOUTH RINSE: 15.0000 mL | OROMUCOSAL | Status: DC | PRN

## 2021-11-28 MED ORDER — PHENYLEPHRINE HCL-NACL 20-0.9 MG/250ML-% IV SOLN
25.0000 ug/min | INTRAVENOUS | Status: DC
  Administered 2021-11-28: 25 ug/min via INTRAVENOUS
  Administered 2021-11-28: 120 ug/min via INTRAVENOUS
  Administered 2021-11-28: 25 ug/min via INTRAVENOUS
  Filled 2021-11-28 (×4): qty 250

## 2021-11-28 MED ORDER — ENOXAPARIN SODIUM 40 MG/0.4ML IJ SOSY
40.0000 mg | PREFILLED_SYRINGE | INTRAMUSCULAR | Status: DC
  Administered 2021-11-28: 40 mg via SUBCUTANEOUS
  Filled 2021-11-28: qty 0.4

## 2021-11-28 MED ORDER — INSULIN ASPART 100 UNIT/ML IJ SOLN
0.0000 [IU] | INTRAMUSCULAR | Status: DC
  Administered 2021-11-28: 3 [IU] via SUBCUTANEOUS
  Administered 2021-11-28: 4 [IU] via SUBCUTANEOUS
  Administered 2021-11-28: 5 [IU] via SUBCUTANEOUS
  Administered 2021-11-28: 3 [IU] via SUBCUTANEOUS
  Administered 2021-11-28: 7 [IU] via SUBCUTANEOUS
  Administered 2021-11-29 (×2): 3 [IU] via SUBCUTANEOUS
  Administered 2021-11-29: 4 [IU] via SUBCUTANEOUS
  Administered 2021-11-29 – 2021-11-30 (×3): 3 [IU] via SUBCUTANEOUS
  Administered 2021-11-30: 4 [IU] via SUBCUTANEOUS
  Administered 2021-11-30: 3 [IU] via SUBCUTANEOUS
  Administered 2021-11-30 (×2): 4 [IU] via SUBCUTANEOUS
  Administered 2021-11-30: 3 [IU] via SUBCUTANEOUS
  Administered 2021-12-01 (×3): 4 [IU] via SUBCUTANEOUS
  Administered 2021-12-01 (×2): 3 [IU] via SUBCUTANEOUS
  Administered 2021-12-01: 4 [IU] via SUBCUTANEOUS
  Administered 2021-12-02 (×3): 3 [IU] via SUBCUTANEOUS
  Administered 2021-12-03: 7 [IU] via SUBCUTANEOUS
  Administered 2021-12-04: 4 [IU] via SUBCUTANEOUS
  Administered 2021-12-04: 3 [IU] via SUBCUTANEOUS
  Administered 2021-12-04: 4 [IU] via SUBCUTANEOUS
  Administered 2021-12-05: 7 [IU] via SUBCUTANEOUS
  Administered 2021-12-05 (×3): 3 [IU] via SUBCUTANEOUS
  Administered 2021-12-06: 4 [IU] via SUBCUTANEOUS
  Administered 2021-12-06 – 2021-12-08 (×4): 3 [IU] via SUBCUTANEOUS
  Administered 2021-12-08: 4 [IU] via SUBCUTANEOUS

## 2021-11-28 MED ORDER — ACETAMINOPHEN 160 MG/5ML PO SOLN
650.0000 mg | Freq: Four times a day (QID) | ORAL | Status: DC | PRN
  Administered 2021-11-28: 650 mg
  Filled 2021-11-28: qty 20.3

## 2021-11-28 MED ORDER — VITAL HIGH PROTEIN PO LIQD
1000.0000 mL | ORAL | Status: DC
  Administered 2021-11-28: 1000 mL
  Filled 2021-11-28: qty 1000

## 2021-11-28 MED ORDER — PANTOPRAZOLE SODIUM 40 MG IV SOLR
40.0000 mg | Freq: Every day | INTRAVENOUS | Status: DC
  Administered 2021-11-28 – 2021-12-01 (×4): 40 mg via INTRAVENOUS
  Filled 2021-11-28 (×5): qty 10

## 2021-11-28 MED ORDER — LACTATED RINGERS IV SOLN: INTRAVENOUS | Status: DC

## 2021-11-28 MED ORDER — SODIUM CHLORIDE 0.9 % IV SOLN
2.0000 g | INTRAVENOUS | Status: DC
  Administered 2021-11-28: 2 g via INTRAVENOUS
  Filled 2021-11-28: qty 20

## 2021-11-28 MED ORDER — DILTIAZEM HCL-DEXTROSE 125-5 MG/125ML-% IV SOLN (PREMIX): 5.0000 mg/h | INTRAVENOUS | Status: DC

## 2021-11-28 MED ORDER — REVEFENACIN 175 MCG/3ML IN SOLN
175.0000 ug | Freq: Every day | RESPIRATORY_TRACT | Status: DC
  Filled 2021-11-28 (×2): qty 3

## 2021-11-28 MED ORDER — DOCUSATE SODIUM 50 MG/5ML PO LIQD
100.0000 mg | Freq: Two times a day (BID) | ORAL | Status: DC
  Administered 2021-11-28 – 2021-12-01 (×6): 100 mg
  Filled 2021-11-28 (×6): qty 10

## 2021-11-28 MED ORDER — ORAL CARE MOUTH RINSE
15.0000 mL | OROMUCOSAL | Status: DC
  Administered 2021-11-28 (×6): 15 mL via OROMUCOSAL

## 2021-11-28 MED ORDER — FENTANYL BOLUS VIA INFUSION
50.0000 ug | INTRAVENOUS | Status: DC | PRN
  Administered 2021-11-28 (×5): 50 ug via INTRAVENOUS
  Administered 2021-11-29 – 2021-12-01 (×19): 100 ug via INTRAVENOUS

## 2021-11-28 MED ORDER — DILTIAZEM HCL-DEXTROSE 125-5 MG/125ML-% IV SOLN (PREMIX)
INTRAVENOUS | Status: AC
  Administered 2021-11-28: 5 mg/h via INTRAVENOUS
  Filled 2021-11-28: qty 125

## 2021-11-28 MED ORDER — BUDESONIDE 0.25 MG/2ML IN SUSP
0.2500 mg | Freq: Two times a day (BID) | RESPIRATORY_TRACT | Status: DC
  Administered 2021-11-28 – 2021-12-02 (×8): 0.25 mg via RESPIRATORY_TRACT
  Filled 2021-11-28 (×8): qty 2

## 2021-11-28 MED ORDER — PROPOFOL 1000 MG/100ML IV EMUL
INTRAVENOUS | Status: AC
  Administered 2021-11-28: 5 ug/kg/min via INTRAVENOUS
  Filled 2021-11-28: qty 100

## 2021-11-28 MED ORDER — FENTANYL CITRATE PF 50 MCG/ML IJ SOSY
50.0000 ug | PREFILLED_SYRINGE | Freq: Once | INTRAMUSCULAR | Status: AC
  Administered 2021-11-28: 50 ug via INTRAVENOUS
  Filled 2021-11-28: qty 1

## 2021-11-28 MED ORDER — POLYETHYLENE GLYCOL 3350 17 G PO PACK
17.0000 g | PACK | Freq: Every day | ORAL | Status: DC
  Administered 2021-11-29 – 2021-12-01 (×3): 17 g
  Filled 2021-11-28 (×3): qty 1

## 2021-11-28 MED ORDER — FUROSEMIDE 10 MG/ML IJ SOLN
40.0000 mg | Freq: Once | INTRAMUSCULAR | Status: AC
  Administered 2021-11-28: 40 mg via INTRAVENOUS
  Filled 2021-11-28: qty 4

## 2021-11-28 MED ORDER — FENTANYL 2500MCG IN NS 250ML (10MCG/ML) PREMIX INFUSION
50.0000 ug/h | INTRAVENOUS | Status: DC
  Administered 2021-11-28: 50 ug/h via INTRAVENOUS
  Administered 2021-11-29: 100 ug/h via INTRAVENOUS
  Administered 2021-11-29 – 2021-12-01 (×5): 200 ug/h via INTRAVENOUS
  Filled 2021-11-28 (×7): qty 250

## 2021-11-28 MED ORDER — ALBUTEROL SULFATE (2.5 MG/3ML) 0.083% IN NEBU: 2.5000 mg | INHALATION_SOLUTION | RESPIRATORY_TRACT | Status: DC | PRN

## 2021-11-28 MED ORDER — ENOXAPARIN SODIUM 100 MG/ML IJ SOSY
90.0000 mg | PREFILLED_SYRINGE | Freq: Two times a day (BID) | INTRAMUSCULAR | Status: DC
  Administered 2021-11-28 – 2021-11-30 (×5): 90 mg via SUBCUTANEOUS
  Filled 2021-11-28 (×5): qty 1

## 2021-11-28 MED ORDER — ROCURONIUM BROMIDE 10 MG/ML (PF) SYRINGE: 1.0000 mg/kg | PREFILLED_SYRINGE | Freq: Once | INTRAVENOUS | Status: AC

## 2021-11-28 MED ORDER — ROCURONIUM BROMIDE 10 MG/ML (PF) SYRINGE
PREFILLED_SYRINGE | INTRAVENOUS | Status: AC
  Administered 2021-11-28: 95.3 mg via INTRAVENOUS
  Filled 2021-11-28: qty 10

## 2021-11-28 MED ORDER — NOREPINEPHRINE 4 MG/250ML-% IV SOLN
1.0000 ug/min | INTRAVENOUS | Status: DC
  Administered 2021-11-29: 3 ug/min via INTRAVENOUS
  Filled 2021-11-28: qty 250

## 2021-11-28 MED ORDER — PROPOFOL 1000 MG/100ML IV EMUL
0.0000 ug/kg/min | INTRAVENOUS | Status: DC
  Administered 2021-11-28: 10 ug/kg/min via INTRAVENOUS
  Administered 2021-11-29: 20 ug/kg/min via INTRAVENOUS
  Administered 2021-11-29: 15 ug/kg/min via INTRAVENOUS
  Administered 2021-11-29: 20 ug/kg/min via INTRAVENOUS
  Administered 2021-11-29 – 2021-11-30 (×2): 25 ug/kg/min via INTRAVENOUS
  Administered 2021-11-30: 30 ug/kg/min via INTRAVENOUS
  Administered 2021-11-30 – 2021-12-01 (×5): 35 ug/kg/min via INTRAVENOUS
  Administered 2021-12-01: 45 ug/kg/min via INTRAVENOUS
  Filled 2021-11-28 (×13): qty 100

## 2021-11-28 MED ORDER — MIDAZOLAM HCL 2 MG/2ML IJ SOLN
1.0000 mg | INTRAMUSCULAR | Status: DC | PRN
  Administered 2021-11-28: 1 mg via INTRAVENOUS
  Administered 2021-11-28 (×2): 2 mg via INTRAVENOUS
  Administered 2021-11-28: 1 mg via INTRAVENOUS
  Administered 2021-11-29 – 2021-12-01 (×6): 2 mg via INTRAVENOUS
  Filled 2021-11-28 (×10): qty 2

## 2021-11-28 MED ORDER — SODIUM CHLORIDE 0.9 % IV SOLN
500.0000 mg | INTRAVENOUS | Status: DC
  Administered 2021-11-28: 500 mg via INTRAVENOUS
  Filled 2021-11-28: qty 5

## 2021-11-28 MED ORDER — ETOMIDATE 2 MG/ML IV SOLN
INTRAVENOUS | Status: AC
  Administered 2021-11-28: 20 mg via INTRAVENOUS
  Filled 2021-11-28: qty 20

## 2021-11-28 MED ORDER — ARFORMOTEROL TARTRATE 15 MCG/2ML IN NEBU
15.0000 ug | INHALATION_SOLUTION | Freq: Two times a day (BID) | RESPIRATORY_TRACT | Status: DC
  Administered 2021-11-28 – 2021-12-08 (×19): 15 ug via RESPIRATORY_TRACT
  Filled 2021-11-28 (×20): qty 2

## 2021-11-28 MED ORDER — NOREPINEPHRINE 4 MG/250ML-% IV SOLN
0.0000 ug/min | INTRAVENOUS | Status: DC
  Administered 2021-11-28: 2 ug/min via INTRAVENOUS
  Filled 2021-11-28: qty 250

## 2021-11-28 MED ORDER — DILTIAZEM LOAD VIA INFUSION
15.0000 mg | Freq: Once | INTRAVENOUS | Status: AC
  Administered 2021-11-28: 15 mg via INTRAVENOUS
  Filled 2021-11-28: qty 15

## 2021-11-28 MED ORDER — HALOPERIDOL LACTATE 5 MG/ML IJ SOLN: 5.0000 mg | Freq: Four times a day (QID) | INTRAMUSCULAR | Status: AC | PRN

## 2021-11-28 MED ORDER — SODIUM CHLORIDE 0.9 % IV BOLUS
1000.0000 mL | Freq: Once | INTRAVENOUS | Status: AC
  Administered 2021-11-28: 1000 mL via INTRAVENOUS

## 2021-11-28 MED ORDER — SODIUM CHLORIDE 0.9 % IV SOLN
3.0000 g | Freq: Four times a day (QID) | INTRAVENOUS | Status: AC
  Administered 2021-11-28 – 2021-12-03 (×20): 3 g via INTRAVENOUS
  Filled 2021-11-28 (×20): qty 8

## 2021-11-28 NOTE — ED Notes (Signed)
Patient started becoming combative and pulling at ETT and lines. Additional medications given to help with sedation. RT called to bedside to assess tube placement, still in correct spot.

## 2021-11-28 NOTE — H&P (Addendum)
NAME:  James [name], MRN:  [id number], DOB:  1960-12-17, LOS: 0 ADMISSION DATE:  11/28/2021, CONSULTATION DATE:  11/28/2021 REFERRING MD:  Dr. Roxanne Mins, ER, CHIEF COMPLAINT:  Short of breath   History of Present Illness:  61 yo male former smoker presented to ER with dyspnea.  SpO2 on room air 82%.  Started on therapy for COPD exacerbation.  Found to have A fib with HR in 160's, and BP 217/135 in ER.  Tried on Bipap, but continued to have respiratory distress.  Intubated in ER.    Pertinent  Medical History  COPD with emphysema, DM type 2, HTN, Lung nodules, PNA, COVID, Pneumothorax, Post-herpetic neuralgia, ETOH  Significant Hospital Events: Including procedures, antibiotic start and stop dates in addition to other pertinent events   11/25 Admit, intubated, started ABx, start cardizem gtt  Interim History / Subjective:  Hx from chart and medical team.  Objective   Blood pressure (!) 217/135, pulse (!) 169, resp. rate 19, height 5\' 9"  (1.753 m), weight 95.3 kg, SpO2 98 %.    Vent Mode: PRVC FiO2 (%):  [100 %] 100 % Set Rate:  [20 bmp] 20 bmp Vt Set:  [550 mL] 550 mL PEEP:  [5 cmH20] 5 cmH20 Plateau Pressure:  [19 cmH20] 19 cmH20  No intake or output data in the 24 hours ending 11/28/21 0532 Filed Weights   11/28/21 0502  Weight: 95.3 kg    Examination:  General - sedated Eyes - pupils reactive ENT - ETT in place Cardiac - irregular, tachycardic Chest - barrel chest, decreased breath sounds, crackles Rt > Lt base Abdomen - soft, non tender, + bowel sounds Extremities - no cyanosis, clubbing, or edema Skin - no rashes Neuro - RASS -3  Resolved Hospital Problem list     Assessment & Plan:   Acute on chronic hypoxic/hypercapnic respiratory failure 2nd to Rt lower lobar community acquired pneumonia with COPD exacerbation. - full vent support - allow for permissive hypercapnia; goal pH > 7.20 - goal SpO2 88 to 95% - f/u CXR, ABG - start rocephin, zithromax - check  blood and sputum culture, pneumococcal/legionella Ag, RVP, COVID/Flu, MRSA PCR - solumedrol 40 mg q12h - brovana, yupelri, pulmicort - prn albuterol  A fib with RVR. HTN emergency with acute pulmonary edema. - start cardizem gtt - f/u cardiac enzymes - check UDS, Echo - lasix 40 mg IV x one on 11/25  DM type 2 poorly controlled with steroid induced hyperglycemia. - SSI  Sedation. - propofol/fentanyl with prn versed for RASS goal -1 to -2  Best Practice (right click and "Reselect all SmartList Selections" daily)   Diet/type: tubefeeds DVT prophylaxis: prophylactic heparin  GI prophylaxis: PPI Lines: N/A Foley:  N/A Code Status:  full code Last date of multidisciplinary goals of care discussion [x]   Labs   CBC: No results for input(s): "WBC", "NEUTROABS", "HGB", "HCT", "MCV", "PLT" in the last 168 hours.  Basic Metabolic Panel: No results for input(s): "NA", "K", "CL", "CO2", "GLUCOSE", "BUN", "CREATININE", "CALCIUM", "MG", "PHOS" in the last 168 hours. GFR: CrCl cannot be calculated (Patient's most recent lab result is older than the maximum 21 days allowed.). No results for input(s): "PROCALCITON", "WBC", "LATICACIDVEN" in the last 168 hours.  Liver Function Tests: No results for input(s): "AST", "ALT", "ALKPHOS", "BILITOT", "PROT", "ALBUMIN" in the last 168 hours. No results for input(s): "LIPASE", "AMYLASE" in the last 168 hours. No results for input(s): "AMMONIA" in the last 168 hours.  ABG    Component  Value Date/Time   PHART 7.239 (L) 10/11/2021 0553   PCO2ART 68.8 (HH) 10/11/2021 0553   PO2ART 130 (H) 10/11/2021 0553   HCO3 27.0 10/11/2021 1058   TCO2 28 10/11/2021 1058   ACIDBASEDEF 0.4 04/21/2020 0201   O2SAT 77 10/11/2021 1058     Coagulation Profile: No results for input(s): "INR", "PROTIME" in the last 168 hours.  Cardiac Enzymes: No results for input(s): "CKTOTAL", "CKMB", "CKMBINDEX", "TROPONINI" in the last 168 hours.  HbA1C: HbA1c, POC  (prediabetic range)  Date/Time Value Ref Range Status  02/13/2020 09:15 AM 6.3 5.7 - 6.4 % Final   HbA1c, POC (controlled diabetic range)  Date/Time Value Ref Range Status  04/29/2021 10:56 AM 6.7 0.0 - 7.0 % Final  12/22/2020 10:59 AM 6.5 0.0 - 7.0 % Final   Hgb A1c MFr Bld  Date/Time Value Ref Range Status  10/12/2021 05:41 AM 6.5 (H) 4.8 - 5.6 % Final    Comment:    (NOTE) Pre diabetes:          5.7%-6.4%  Diabetes:              >6.4%  Glycemic control for   <7.0% adults with diabetes   07/13/2021 12:18 AM 6.4 (H) 4.8 - 5.6 % Final    Comment:    (NOTE) Pre diabetes:          5.7%-6.4%  Diabetes:              >6.4%  Glycemic control for   <7.0% adults with diabetes     CBG: No results for input(s): "GLUCAP" in the last 168 hours.  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   Surgical History:   Past Surgical History:  Procedure Laterality Date   LUNG REMOVAL, PARTIAL     VIDEO ASSISTED THORACOSCOPY (VATS)/THOROCOTOMY Left 06/11/2013   Procedure: VIDEO ASSISTED THORACOSCOPY (VATS)/THOROCOTOMY;  Surgeon: Ivin Poot, MD;  Location: [place];  Service: Thoracic;  Laterality: Left;   VIDEO BRONCHOSCOPY N/A 06/11/2013   Procedure: VIDEO BRONCHOSCOPY;  Surgeon: Ivin Poot, MD;  Location: [place];  Service: Thoracic;  Laterality: N/A;   VIDEO BRONCHOSCOPY N/A 08/10/2018    Procedure: VIDEO BRONCHOSCOPY WITH FLUORO;  Surgeon: Candee Furbish, MD;  Location: [place];  Service: Endoscopy;  Laterality: N/A;     Social History:   reports that he quit smoking about 9 years ago. His smoking use included cigarettes. He has a 60.00 pack-year smoking history. He has quit using smokeless tobacco.  His smokeless tobacco use included chew. He reports that he does not currently use alcohol after a past usage of about 28.0 standard drinks of alcohol per week. He reports that he does not currently use drugs after having used the following drugs: Marijuana.   Family History:  His family history includes COPD in his sister; Heart disease in his father.   Allergies No Known Allergies   Home Medications  Prior to Admission medications   Medication Sig Start Date End Date Taking? Authorizing Provider  albuterol (PROVENTIL) (2.5 MG/3ML) 0.083% nebulizer  solution Take 3 mLs (2.5 mg total) by nebulization every 6 (six) hours as needed for wheezing or shortness of breath. 05/12/21 05/12/22  [name], MD  albuterol (VENTOLIN HFA) 108 (90 Base) MCG/ACT inhaler Inhale 2 puffs into the lungs every 4 (four) hours as needed. Patient taking differently: Inhale 2 puffs into the lungs every 4 (four) hours as needed for wheezing or shortness of breath. 09/09/21   [name], MD  Budeson-Glycopyrrol-Formoterol (BREZTRI AEROSPHERE) 160-9-4.8 MCG/ACT AERO Inhale 2 puffs into the lungs in the morning and at bedtime. 10/14/21   Maryjane Hurter, MD  clobetasol cream (TEMOVATE) 3.76 % Apply 1 Application topically 2 (two) times daily. Patient taking differently: Apply 1 Application topically 2 (two) times daily as needed (rash). 09/09/21   [name], MD  clotrimazole-betamethasone (LOTRISONE) cream Apply 1 Application topically 2 (two) times daily to groin affected areas Patient taking differently: Apply 1 Application topically 2 (two) times daily as needed (rash). 09/09/21    [name], MD  furosemide (LASIX) 20 MG tablet Take 1 tablet (20 mg total) by mouth daily as needed for edema. 08/11/21 08/11/22  [name], MD  glipiZIDE (GLUCOTROL XL) 5 MG 24 hr tablet Take 1 tablet (5 mg total) by mouth daily with breakfast. 06/29/21   [name], MD  losartan (COZAAR) 50 MG tablet Take 1 tablet (50 mg total) by mouth daily. 06/29/21   [name], MD  metoprolol succinate (TOPROL-XL) 25 MG 24 hr tablet Take 1 tablet (25 mg total) by mouth daily. 06/29/21 06/29/22  [name], MD  pantoprazole (PROTONIX) 40 MG tablet Take 1 tablet (40 mg total) by mouth daily. 06/29/21   [name], MD  predniSONE (DELTASONE) 10 MG tablet Take 1 tablet (10 mg total) by mouth daily. 10/29/21   Linward Natal, MD  pregabalin (LYRICA) 100 MG capsule Take 1 capsule (100 mg total) by mouth 2 (two) times daily. 10/14/21   [name], MD  Vitamin D3 (VITAMIN D) 25 MCG tablet Take 1 tablet by mouth daily. Patient not taking: Reported on 10/14/2021 07/14/21   [name], MD     Critical care time: 26 minutes  Chesley Mires, MD [place] Pager - [phone] 11/28/2021, 6:17 AM

## 2021-11-28 NOTE — ED Notes (Signed)
Admitting ICU attending is present at bedside evaluating patient with ED provider Roxanne Mins, MD

## 2021-11-28 NOTE — ED Notes (Signed)
MD paged at this time for further orders as bp is currently 85/58. Will continue to monitor. Waiting for MD call back.

## 2021-11-28 NOTE — ED Notes (Signed)
Calling [facility] to tx

## 2021-11-28 NOTE — Progress Notes (Signed)
Consult placed for IV team d/t peripheral vasopressor use.  Pt arrived with vasopressor infusing through the 18G in the Right AC with excellent blood return. Discussed and per Raynelle Fanning, RN with IV team no ultrasound guided site is needed at this time. RN to place IV watch. Instructed to re consult if IV watch alarms or if the site no longer functions properly.

## 2021-11-28 NOTE — ED Notes (Signed)
Sood, MD notified of patient becoming combative and having to receive additional sedation medications. Blood pressure currently 86/56 with a MAP of 65. Awaiting new orders at this time

## 2021-11-28 NOTE — Progress Notes (Addendum)
[facility] Progress Note Patient Name: James [name] DOB: [birth year] MRN: [id number]   Date of Service  11/28/2021  HPI/Events of Note  Troponin rose from 467 to 1676. Remains intubated, on pressor support.    eICU Interventions  Rising troponin likely demand ischemia from underlying septic shock and respiratory failure.  Will check EKG, continue to trend troponin.  Increased lovenox from 40mg  to 1mg /kg q12hours (Pt on elequis for Afib, which has been held) Will monitor renal function, change dose if needed.  Monitor for bleed, thrombocytopenia.         [place] 11/28/2021, 8:00 PM

## 2021-11-28 NOTE — ED Provider Notes (Signed)
[facility] EMERGENCY DEPARTMENT Provider Note   CSN: [id number] Arrival date & time: 11/28/21  0453     History  Chief Complaint  Patient presents with   Shortness of Breath    James [name] is a 61 y.o. male.  The history is provided by the patient and the EMS personnel. The history is limited by the condition of the patient (Severe respite distress).  Shortness of Breath He has history of hypertension, diabetes, COPD with chronic respiratory failure and comes in because of severe shortness of breath tonight.  He states that yesterday was a normal day for him.  He woke up with severe dyspnea.  He denies chest pain, heaviness, tightness, pressure.  Denies any cough or fever.  He tried using his inhaler with no relief.  He called for an ambulance and EMS noted oxygen saturation of 82%.  He received albuterol with ipratropium via nebulizer, methylprednisolone, magnesium, epinephrine and was placed on CPAP.  He does have history of having been intubated in the past.  He states that he would want to be intubated, if necessary.   Home Medications Prior to Admission medications   Medication Sig Start Date End Date Taking? Authorizing Provider  albuterol (PROVENTIL) (2.5 MG/3ML) 0.083% nebulizer solution Take 3 mLs (2.5 mg total) by nebulization every 6 (six) hours as needed for wheezing or shortness of breath. 05/12/21 05/12/22  [name], MD  albuterol (VENTOLIN HFA) 108 (90 Base) MCG/ACT inhaler Inhale 2 puffs into the lungs every 4 (four) hours as needed. Patient taking differently: Inhale 2 puffs into the lungs every 4 (four) hours as needed for wheezing or shortness of breath. 09/09/21   [name], MD  Budeson-Glycopyrrol-Formoterol (BREZTRI AEROSPHERE) 160-9-4.8 MCG/ACT AERO Inhale 2 puffs into the lungs in the morning and at bedtime. 10/14/21   Maryjane Hurter, MD  clobetasol cream (TEMOVATE) 8.78 % Apply 1 Application topically 2 (two) times  daily. Patient taking differently: Apply 1 Application topically 2 (two) times daily as needed (rash). 09/09/21   [name], MD  clotrimazole-betamethasone (LOTRISONE) cream Apply 1 Application topically 2 (two) times daily to groin affected areas Patient taking differently: Apply 1 Application topically 2 (two) times daily as needed (rash). 09/09/21   [name], MD  furosemide (LASIX) 20 MG tablet Take 1 tablet (20 mg total) by mouth daily as needed for edema. 08/11/21 08/11/22  [name], MD  glipiZIDE (GLUCOTROL XL) 5 MG 24 hr tablet Take 1 tablet (5 mg total) by mouth daily with breakfast. 06/29/21   [name], MD  losartan (COZAAR) 50 MG tablet Take 1 tablet (50 mg total) by mouth daily. 06/29/21   [name], MD  metoprolol succinate (TOPROL-XL) 25 MG 24 hr tablet Take 1 tablet (25 mg total) by mouth daily. 06/29/21 06/29/22  [name], MD  pantoprazole (PROTONIX) 40 MG tablet Take 1 tablet (40 mg total) by mouth daily. 06/29/21   [name], MD  predniSONE (DELTASONE) 10 MG tablet Take 1 tablet (10 mg total) by mouth daily. 10/29/21   Linward Natal, MD  pregabalin (LYRICA) 100 MG capsule Take 1 capsule (100 mg total) by mouth 2 (two) times daily. 10/14/21   [name], MD  Vitamin D3 (VITAMIN D) 25 MCG tablet Take 1 tablet by mouth daily. Patient not taking: Reported on 10/14/2021 07/14/21   [name], MD      Allergies    Patient has no  known allergies.    Review of Systems   Review of Systems  Unable to perform ROS: Severe respiratory distress  Respiratory:  Positive for shortness of breath.     Physical Exam Updated Vital Signs BP (!) 217/135 (BP Location: Left Arm)   Pulse (!) 151   Resp (!) 32   Ht 5\' 9"  (1.753 m)   Wt 95.3 kg   SpO2 (!) 84%   BMI 31.01 kg/m  Physical Exam Vitals and nursing note reviewed.   61 year old male on BiPAP and in severe respiratory distress, not tolerating BiPAP well. Vital signs  are significant for elevated heart rate, respiratory rate, blood pressure. Oxygen saturation is 84%, which is hypoxic. Head is normocephalic and atraumatic. PERRLA, EOMI. Oropharynx is clear. Neck is nontender and supple without adenopathy or JVD. Back is nontender and there is no CVA tenderness. Lungs have diminished airflow throughout and tight wheezing throughout. Chest is nontender.  He is using accessory muscles of respiration. Heart is tachycardic without murmur. Abdomen is soft, flat, nontender. Extremities have no cyanosis or edema, full range of motion is present. Skin is warm and dry without rash. Neurologic: Mental status is normal, cranial nerves are intact, moves all extremities equally.  ED Results / Procedures / Treatments   Labs (all labs ordered are listed, but only abnormal results are displayed) Labs Reviewed  BASIC METABOLIC PANEL  CBC WITH DIFFERENTIAL/PLATELET  BRAIN NATRIURETIC PEPTIDE  TROPONIN I (HIGH SENSITIVITY)    EKG EKG Interpretation  Date/Time:  Saturday November 28 2021 05:06:15 EST Ventricular Rate:  154 PR Interval:  116 QRS Duration: 85 QT Interval:  310 QTC Calculation: 497 R Axis:   72 Text Interpretation: Sinus tachycardia Atrial premature complexes Anterior infarct, old Nonspecific T abnormalities, lateral leads Artifact in lead(s) II III aVR aVL aVF V6 When compared with ECG of 10/26/2021, HEART RATE has increased Premature ventricular complexes are no longer present Confirmed by Delora Fuel ([id number]) on 11/28/2021 5:25:22 AM  Radiology DG Chest Port 1 View  Result Date: 11/28/2021 CLINICAL DATA:  Shortness of breath.  Evaluate ET tube placement. EXAM: PORTABLE CHEST 1 VIEW COMPARISON:  10/26/2021 FINDINGS: ETT tip is 3 cm above the carina. Enteric tube tip and side port are in the stomach below the GE junction. Normal heart size. Aortic atherosclerosis. Persistent and progressive airspace disease is identified within the right mid and  right lower lung. Left lung appears clear. Numerous bilateral chronic rib fracture deformities. Remote distal left clavicle fracture. IMPRESSION: 1. Satisfactory position of ETT and enteric tube. 2. Persistent and progressive airspace disease within the right mid and right lower lung. Imaging findings are concerning for pneumonia. Follow-up imaging is recommended to ensure complete resolution and to exclude underlying malignancy. Electronically Signed   By: Kerby Moors M.D.   On: 11/28/2021 06:36    Procedures Procedure Name: Intubation Date/Time: 11/28/2021 5:34 AM  Performed by: Delora Fuel, MDPre-anesthesia Checklist: Patient identified, Patient being monitored, Emergency Drugs available, Timeout performed and Suction available Oxygen Delivery Method: Non-rebreather mask Preoxygenation: Pre-oxygenation with 100% oxygen Induction Type: Rapid sequence Ventilation: Mask ventilation without difficulty Laryngoscope Size: Glidescope and 3 Grade View: Grade I Tube size: 7.5 mm Number of attempts: 1 Airway Equipment and Method: Rigid stylet and Video-laryngoscopy Placement Confirmation: ETT inserted through vocal cords under direct vision, CO2 detector and Breath sounds checked- equal and bilateral Secured at: 23 cm Tube secured with: ETT holder Dental Injury: Teeth and Oropharynx as per pre-operative assessment  Cardiac monitor shows sinus tachycardia, per my interpretation.  Medications Ordered in ED Medications  rocuronium bromide 100 MG/10ML SOSY (has no administration in time range)  etomidate (AMIDATE) 2 MG/ML injection (has no administration in time range)  etomidate (AMIDATE) 2 MG/ML injection (has no administration in time range)  propofol (DIPRIVAN) 1000 MG/100ML infusion (has no administration in time range)  propofol (DIPRIVAN) 1000 MG/100ML infusion (has no administration in time range)    ED Course/ Medical Decision Making/ A&P                           Medical  Decision Making Amount and/or Complexity of Data Reviewed Labs: ordered. Radiology: ordered.  Risk Prescription drug management.   COPD exacerbation with severe respiratory distress.  He is not tolerating BiPAP well and continues to be in severe distress despite BiPAP and aggressive treatment in the ambulance.  He is hypoxic in spite of this treatment and decision is made to intubate him.  He is intubated, and following this, oxygen saturation came up into the mid 90s although he continues to be tachycardic and hypertensive.  Postintubation x-ray shows good position of the endotracheal tube.  I have ordered chest x-ray to look for evidence of pneumonia.  I have reviewed and interpreted his electrocardiogram, and my interpretation is sinus tachycardia versus multifocal atrial tachycardia with nonspecific T wave changes unchanged from prior.  I have ordered screening labs of CBC, basic metabolic panel, troponin, BNP.  I have ordered a blood gas.  I have reviewed his past records, and he has had 6 hospital admissions for COPD exacerbation at this calendar year, most recently on 10/26/2021.  I have discussed case with Dr. Halford Chessman of critical care service who agrees to admit the patient.  I have reviewed and interpreted the laboratory tests which are back at this point.  My interpretation is arterial blood gases consistent with hypoxic and hypercarbic respiratory failure, marked leukocytosis which is felt to be stress related, elevated troponin which likely represents demand ischemia rather than non-STEMI.  Basic metabolic panel and BNP are still pending.  CRITICAL CARE Performed by: Delora Fuel Total critical care time: 60 minutes Critical care time was exclusive of separately billable procedures and treating other patients. Critical care was necessary to treat or prevent imminent or life-threatening deterioration. Critical care was time spent personally by me on the following activities: development of  treatment plan with patient and/or surrogate as well as nursing, discussions with consultants, evaluation of patient's response to treatment, examination of patient, obtaining history from patient or surrogate, ordering and performing treatments and interventions, ordering and review of laboratory studies, ordering and review of radiographic studies, pulse oximetry and re-evaluation of patient's condition.  Final Clinical Impression(s) / ED Diagnoses Final diagnoses:  Acute respiratory failure with hypoxia and hypercapnia (HCC)  COPD exacerbation (HCC)  Elevated troponin  Elevated blood pressure reading with diagnosis of hypertension    Rx / DC Orders ED Discharge Orders     None         Delora Fuel, MD 23/36/12 [phone]

## 2021-11-28 NOTE — Progress Notes (Addendum)
NAME:  James [name], MRN:  [id number], DOB:  May 23, 1960, LOS: 0 ADMISSION DATE:  11/28/2021, CONSULTATION DATE:  11/28/2021 REFERRING MD:  Dr. Roxanne Mins, ER, CHIEF COMPLAINT:  Short of breath   History of Present Illness:  61 yo male former smoker presented to ER with dyspnea.  SpO2 on room air 82%.  Started on therapy for COPD exacerbation.  Found to have A fib with HR in 160's, and BP 217/135 in ER.  Tried on Bipap, but continued to have respiratory distress.  Intubated in ER.    Pertinent  Medical History  COPD with emphysema, DM type 2, HTN, Lung nodules, PNA, COVID, Pneumothorax, Post-herpetic neuralgia, ETOH  Significant Hospital Events: Including procedures, antibiotic start and stop dates in addition to other pertinent events   11/25 Admit, intubated, started ABx, start cardizem gtt-->got hypotensive. Converted to SR but started on Neo.   Interim History / Subjective:  Hx from chart and medical team.  Objective   Blood pressure (!) 217/135, pulse (!) 169, resp. rate 19, height 5\' 9"  (1.753 m), weight 95.3 kg, SpO2 98 %.    Vent Mode: PRVC FiO2 (%):  [100 %] 100 % Set Rate:  [20 bmp] 20 bmp Vt Set:  [550 mL] 550 mL PEEP:  [5 cmH20] 5 cmH20 Plateau Pressure:  [19 cmH20] 19 cmH20  No intake or output data in the 24 hours ending 11/28/21 0532 Filed Weights   11/28/21 0502  Weight: 95.3 kg    Examination:  General heavily sedated on gtts HENT NCAT no JVD MMM Pulm prolonged exp wheeze Card rrr (now ST) Abd soft Ext warm weak pulses Neuro sedated   Resolved Hospital Problem list   HTN emergency with acute pulmonary edema.  Assessment & Plan:   Acute on chronic hypoxic/hypercapnic respiratory failure 2nd to Rt lower lobar community acquired pneumonia with COPD exacerbation. Plan Cont full vent support  W/ air trapping will allow for permissive hypercarbia as long as Ph > 7.2 Day 1 azith and ceftriaxone Scheduled BDs Scheduled systemic steroids F/u abg VAP  bundle PAD protocol  F/u urine antigens. RVP and covid  Am cxr  Circulatory Shock favor mix of sepsis/septic shock and medication related hypotension from sedation and CCB Plan Fluid challenge Start peripheral Neo Dc ccb  Ck lactate Tele  Hold antihypertensives   A fib with RVR-->currently ST W/ elevated Trop I Initially looked like HTN emergency but developed fairly sig hypotension after CCB infusion and lasix  Plan Dc cardiazem Ensure K > 4 and Mg > 2 If goes back into AF will use amio F/u CEs repeat 12 lead F/u ECHO if has WM abnormality or CEs trending up will call cards  DM type 2 poorly controlled with steroid induced hyperglycemia. Plan SSI goal 140-180   Sedation Plan Cont Prop and fent.  RASS goal -1    Best Practice (right click and "Reselect all SmartList Selections" daily)   Diet/type: tubefeeds DVT prophylaxis: prophylactic heparin  GI prophylaxis: PPI Lines: N/A Foley:  N/A Code Status:  full code Last date of multidisciplinary goals of care discussion [x]    Critical care time: 34 min   Erick Colace ACNP-BC [place] Pager # [phone] OR # [phone] if no answer    Critical care attending attestation note: I agree with the Advanced Practitioner's note, impression, and recommendations as outlined. I have taken an independent interval history, reviewed the chart and examined the patient. The following reflects my medical decision making and independent  critical care time  Synopsis of assessment and plan: 61 year old male with COPD, active tobacco dependence and lung nodule was brought to the emergency department with increasing shortness of breath, noted to be hypoxic, requiring endotracheal intubation  Patient is hypotensive, on phenylephrine   Physical exam: General: Critically ill-appearing male, orally intubated HEENT: [place]/AT, eyes anicteric.  ETT and OGT in place Neuro: Sedated, not following commands.  Eyes are  closed.  Pupils 3 mm bilateral reactive to light Chest: Bilateral basal crackles right more than left, no wheezes or rhonchi Heart: Regular rate and rhythm, no murmurs or gallops Abdomen: Soft, nontender, nondistended, bowel sounds present Skin: No rash  Labs and images were reviewed  Assessment and plan: Acute on chronic hypoxic/hypercapnic respiratory failure Acute COPD exacerbation Community-acquired right lower lobe pneumonia Rhinoviral/enteroviral respiratory infection Septic shock due to pneumonia Paroxysmal A-fib with RVR [place]and cardiac ischemia Diabetes type 2 with hyperglycemia Hyperkalemia Acute kidney injury  Continue lung protective ventilation Vent setting was adjusted to clear hypercapnia Continue nebs and steroids Continue IV antibiotic with Unasyn Follow-up respiratory culture Continue droplet precautions Continue vasopressor support with map goal 65 Patient is in sinus rhythm now Monitor fingerstick with goal 140-180 Closely monitor electrolyte and serum creatinine Avoid nephrotoxic agents   CRITICAL CARE Performed by: Jacky Kindle   Total independent critical care time: 35 minutes  Critical care time was exclusive of separately billable procedures and treating other patients.  Critical care was necessary to treat or prevent imminent or life-threatening deterioration.   Critical care was time spent personally by me on the following activities: development of treatment plan with patient and/or surrogate as well as nursing, discussions with consultants, evaluation of patient's response to treatment, examination of patient, obtaining history from patient or surrogate, ordering and performing treatments and interventions, ordering and review of laboratory studies, ordering and review of radiographic studies, pulse oximetry, re-evaluation of patient's condition and participation in multidisciplinary rounds.  Jacky Kindle, MD [facility] See Amion for pager If no response to pager, please call [phone] until 7pm After 7pm, Please call E-link [phone]  11/28/2021, 1:17 PM

## 2021-11-28 NOTE — Progress Notes (Signed)
Pharmacy Antibiotic Note  NEKHI LIWANAG is a 61 y.o. male admitted on 11/28/2021 with pneumonia.  Pharmacy has been consulted for unasyn dosing. Pt is febrile with Tmax 102.7 and WBC is elevated. SCr and lactic acid are WNL.   Plan: Unasyn 3g IV Q6H F/u renal fxn, C&S, clinical status   Height: 5\' 9"  (175.3 cm) Weight: 95.3 kg (210 lb) IBW/kg (Calculated) : 70.7  Temp (24hrs), Avg:101.2 F (38.4 C), Min:93 F (33.9 C), Max:102.7 F (39.3 C)  Recent Labs  Lab 11/28/21 0500 11/28/21 0625 11/28/21 0904  WBC 24.6*  --   --   CREATININE  --  1.13  --   LATICACIDVEN  --   --  1.7    Estimated Creatinine Clearance: 79.2 mL/min (by C-G formula based on SCr of 1.13 mg/dL).    No Known Allergies  Antimicrobials this admission: Unasyn 11/25>> Azithro/CTX x 1 11/25  Dose adjustments this admission: N/A  Microbiology results: Pending Thank you for allowing pharmacy to be a part of this patient's care.  Linsi Humann, Rande Lawman 11/28/2021 11:33 AM

## 2021-11-28 NOTE — ED Notes (Signed)
Roxanne Mins, MD notified of patient's heart rate and hypertension

## 2021-11-28 NOTE — ED Triage Notes (Signed)
Patient arrives via EMS for shortness of breath. Patient was found at home with shortness of breath, on scene O2 was 82%. While in route to hospital, pt received 1 duo neb, 125 of Solu-Medrol, 2 grams of Mag and 0.3 of IM epi. Patient was found to be in afib RVR on monitor. Patient placed on CPAP while in the ambulance. RT present at bedside during triage to place pt on BiPap. Hx of COPD.

## 2021-11-28 NOTE — ED Notes (Signed)
Positive color change noted by Roxanne Mins, MD. BL breath sounds auscultated.

## 2021-11-28 NOTE — ED Notes (Signed)
Normal saline bolus started at this time.

## 2021-11-28 NOTE — ED Notes (Signed)
Pt converted to ST at this time. Cardizem drip stopped per provider's orders. ICU provider at bedside. Notified of pt bp. Will continue to monitor.

## 2021-11-28 NOTE — Progress Notes (Signed)
A consult was placed to the hospital's IV Nurse to place an iv for vasopressors;  pt has 3 peripheral ivs at present; have secure chatted Avon Gully, RN regarding need for further access;  per RN, the vasopressor is infusing into the 18ga in the pt's [facility] with a good blood return; RN will be placing the iwatch soon; will have RN place a new consult if more iv access is needed;

## 2021-11-28 NOTE — ED Notes (Signed)
ER MD present at bedside to assess patient. RT also present at bedside to place patient on Bi-PAP and help manage airway.

## 2021-11-28 NOTE — Procedures (Signed)
Arterial Catheter Insertion Procedure Note  James [name]  [id number]  13-Jul-1960  Date:11/28/21  Time:12:21 PM    Provider Performing: Clementeen Graham    Procedure: Insertion of Arterial Line [phone]) with US guidance (00511)   Indication(s) Blood pressure monitoring and/or need for frequent ABGs  Consent Unable to obtain consent due to emergent nature of procedure.  Anesthesia None   Time Out Verified patient identification, verified procedure, site/side was marked, verified correct patient position, special equipment/implants available, medications/allergies/relevant history reviewed, required imaging and test results available.   Sterile Technique Maximal sterile technique including full sterile barrier drape, hand hygiene, sterile gown, sterile gloves, mask, hair covering, sterile ultrasound probe cover (if used).   Procedure Description Area of catheter insertion was cleaned with chlorhexidine and draped in sterile fashion. With real-time ultrasound guidance an arterial catheter was placed into the left radial artery.  Appropriate arterial tracings confirmed on monitor.     Complications/Tolerance None; patient tolerated the procedure well.   EBL Minimal   Specimen(s) None   James [name] ACNP-BC [place] Pager # [phone] OR # [phone] if no answer

## 2021-11-28 NOTE — ED Notes (Signed)
Xray present at bedside to confirm ETT placement and OGT placement. Per xray reading, both tubes in place.

## 2021-11-28 NOTE — ED Notes (Signed)
Provider at bedside attempting an A-line with RT.

## 2021-11-28 NOTE — Progress Notes (Signed)
Pt. Was placed on bipap 16/8 70% by RT once arriving to room 26. Pt. Did not show any signs of improvement with the bipap. MD decided to intubate.

## 2021-11-29 ENCOUNTER — Inpatient Hospital Stay (HOSPITAL_COMMUNITY): Payer: Medicaid Other

## 2021-11-29 ENCOUNTER — Encounter (HOSPITAL_COMMUNITY): Payer: Self-pay | Admitting: Pulmonary Disease

## 2021-11-29 DIAGNOSIS — I4891 Unspecified atrial fibrillation: Secondary | ICD-10-CM

## 2021-11-29 DIAGNOSIS — I21A1 Myocardial infarction type 2: Secondary | ICD-10-CM | POA: Diagnosis not present

## 2021-11-29 DIAGNOSIS — J9621 Acute and chronic respiratory failure with hypoxia: Secondary | ICD-10-CM | POA: Diagnosis not present

## 2021-11-29 DIAGNOSIS — Z4682 Encounter for fitting and adjustment of non-vascular catheter: Secondary | ICD-10-CM | POA: Diagnosis not present

## 2021-11-29 DIAGNOSIS — I214 Non-ST elevation (NSTEMI) myocardial infarction: Secondary | ICD-10-CM | POA: Diagnosis not present

## 2021-11-29 DIAGNOSIS — J159 Unspecified bacterial pneumonia: Secondary | ICD-10-CM | POA: Diagnosis not present

## 2021-11-29 DIAGNOSIS — B348 Other viral infections of unspecified site: Secondary | ICD-10-CM | POA: Diagnosis not present

## 2021-11-29 DIAGNOSIS — I4719 Other supraventricular tachycardia: Secondary | ICD-10-CM | POA: Diagnosis not present

## 2021-11-29 DIAGNOSIS — J9601 Acute respiratory failure with hypoxia: Secondary | ICD-10-CM | POA: Diagnosis not present

## 2021-11-29 DIAGNOSIS — Z20822 Contact with and (suspected) exposure to covid-19: Secondary | ICD-10-CM | POA: Diagnosis not present

## 2021-11-29 DIAGNOSIS — J440 Chronic obstructive pulmonary disease with acute lower respiratory infection: Secondary | ICD-10-CM | POA: Diagnosis not present

## 2021-11-29 DIAGNOSIS — A419 Sepsis, unspecified organism: Secondary | ICD-10-CM | POA: Diagnosis not present

## 2021-11-29 DIAGNOSIS — E440 Moderate protein-calorie malnutrition: Secondary | ICD-10-CM | POA: Diagnosis not present

## 2021-11-29 DIAGNOSIS — J9622 Acute and chronic respiratory failure with hypercapnia: Secondary | ICD-10-CM | POA: Diagnosis not present

## 2021-11-29 DIAGNOSIS — J9690 Respiratory failure, unspecified, unspecified whether with hypoxia or hypercapnia: Secondary | ICD-10-CM | POA: Diagnosis not present

## 2021-11-29 DIAGNOSIS — I5031 Acute diastolic (congestive) heart failure: Secondary | ICD-10-CM | POA: Diagnosis not present

## 2021-11-29 DIAGNOSIS — Z8616 Personal history of COVID-19: Secondary | ICD-10-CM | POA: Diagnosis not present

## 2021-11-29 DIAGNOSIS — R6521 Severe sepsis with septic shock: Secondary | ICD-10-CM | POA: Diagnosis not present

## 2021-11-29 DIAGNOSIS — J9602 Acute respiratory failure with hypercapnia: Secondary | ICD-10-CM | POA: Diagnosis not present

## 2021-11-29 LAB — CBC
HCT: 35.2 % — ABNORMAL LOW (ref 39.0–52.0)
Hemoglobin: 10.8 g/dL — ABNORMAL LOW (ref 13.0–17.0)
MCH: 29.3 pg (ref 26.0–34.0)
MCHC: 30.7 g/dL (ref 30.0–36.0)
MCV: 95.7 fL (ref 80.0–100.0)
Platelets: 259 10*3/uL (ref 150–400)
RBC: 3.68 MIL/uL — ABNORMAL LOW (ref 4.22–5.81)
RDW: 14.5 % (ref 11.5–15.5)
WBC: 22.4 10*3/uL — ABNORMAL HIGH (ref 4.0–10.5)
nRBC: 0 % (ref 0.0–0.2)

## 2021-11-29 LAB — BASIC METABOLIC PANEL
Anion gap: 8 (ref 5–15)
Anion gap: 7 (ref 5–15)
BUN: 21 mg/dL — ABNORMAL HIGH (ref 6–20)
BUN: 26 mg/dL — ABNORMAL HIGH (ref 6–20)
CO2: 27 mmol/L (ref 22–32)
CO2: 28 mmol/L (ref 22–32)
Calcium: 8.3 mg/dL — ABNORMAL LOW (ref 8.9–10.3)
Calcium: 8.3 mg/dL — ABNORMAL LOW (ref 8.9–10.3)
Chloride: 107 mmol/L (ref 98–111)
Chloride: 106 mmol/L (ref 98–111)
Creatinine, Ser: 0.62 mg/dL (ref 0.61–1.24)
Creatinine, Ser: 0.89 mg/dL (ref 0.61–1.24)
GFR, Estimated: >60 mL/min (ref >60)
GFR, Estimated: >60 mL/min (ref >60)
Glucose, Bld: 149 mg/dL — ABNORMAL HIGH (ref 70–99)
Glucose, Bld: 167 mg/dL — ABNORMAL HIGH (ref 70–99)
Potassium: 4.8 mmol/L (ref 3.5–5.1)
Potassium: 4.6 mmol/L (ref 3.5–5.1)
Sodium: 142 mmol/L (ref 135–145)
Sodium: 141 mmol/L (ref 135–145)

## 2021-11-29 LAB — GLUCOSE, CAPILLARY
Glucose-Capillary: 153 mg/dL — ABNORMAL HIGH (ref 70–99)
Glucose-Capillary: 140 mg/dL — ABNORMAL HIGH (ref 70–99)
Glucose-Capillary: 144 mg/dL — ABNORMAL HIGH (ref 70–99)
Glucose-Capillary: 135 mg/dL — ABNORMAL HIGH (ref 70–99)

## 2021-11-29 LAB — ECHOCARDIOGRAM COMPLETE
AR max vel: 2.8 cm2
AV Area VTI: 2.94 cm2
AV Area mean vel: 2.77 cm2
AV Mean grad: 3 mmHg
AV Peak grad: 4.8 mmHg
Ao pk vel: 1.1 m/s
Area-P 1/2: 2.6 cm2
Height: 69 in
S' Lateral: 3.1 cm
Single Plane A4C EF: 57.8 %
Weight: 3287.5 oz

## 2021-11-29 LAB — BLOOD GAS, ARTERIAL
Acid-Base Excess: 3.6 mmol/L — ABNORMAL HIGH (ref 0.0–2.0)
Bicarbonate: 29.6 mmol/L — ABNORMAL HIGH (ref 20.0–28.0)
O2 Saturation: 99.4 %
Patient temperature: 37.1
pCO2 arterial: 50 mmHg — ABNORMAL HIGH (ref 32–48)
pH, Arterial: 7.38 (ref 7.35–7.45)
pO2, Arterial: 124 mmHg — ABNORMAL HIGH (ref 83–108)

## 2021-11-29 LAB — MAGNESIUM
Magnesium: 2.3 mg/dL (ref 1.7–2.4)
Magnesium: 2.2 mg/dL (ref 1.7–2.4)

## 2021-11-29 LAB — PHOSPHORUS: Phosphorus: 2.4 mg/dL — ABNORMAL LOW (ref 2.5–4.6)

## 2021-11-29 LAB — TRIGLYCERIDES: Triglycerides: 85 mg/dL (ref <150)

## 2021-11-29 LAB — TROPONIN I (HIGH SENSITIVITY): Troponin I (High Sensitivity): 1296 ng/L (ref <18)

## 2021-11-29 MED ORDER — ATROPINE SULFATE 1 MG/10ML IJ SOSY
PREFILLED_SYRINGE | INTRAMUSCULAR | Status: AC
  Filled 2021-11-29: qty 10

## 2021-11-29 MED ORDER — FOLIC ACID 1 MG PO TABS
1.0000 mg | ORAL_TABLET | Freq: Every day | ORAL | Status: DC
  Administered 2021-11-29 – 2021-12-01 (×3): 1 mg
  Filled 2021-11-29 (×3): qty 1

## 2021-11-29 MED ORDER — PERFLUTREN LIPID MICROSPHERE
1.0000 mL | INTRAVENOUS | Status: AC | PRN
  Administered 2021-11-29: 4 mL via INTRAVENOUS

## 2021-11-29 MED ORDER — IPRATROPIUM-ALBUTEROL 0.5-2.5 (3) MG/3ML IN SOLN
3.0000 mL | Freq: Four times a day (QID) | RESPIRATORY_TRACT | Status: DC
  Administered 2021-11-29 – 2021-12-02 (×12): 3 mL via RESPIRATORY_TRACT
  Filled 2021-11-29 (×12): qty 3

## 2021-11-29 MED ORDER — PROSOURCE TF20 ENFIT COMPATIBL EN LIQD
60.0000 mL | Freq: Every day | ENTERAL | Status: DC
  Administered 2021-11-29 – 2021-12-01 (×3): 60 mL
  Filled 2021-11-29 (×3): qty 60

## 2021-11-29 MED ORDER — VITAL 1.5 CAL PO LIQD
1000.0000 mL | ORAL | Status: DC
  Administered 2021-11-29 – 2021-12-01 (×2): 1000 mL
  Filled 2021-11-29 (×6): qty 1000

## 2021-11-29 MED ORDER — THIAMINE MONONITRATE 100 MG PO TABS
100.0000 mg | ORAL_TABLET | Freq: Every day | ORAL | Status: DC
  Administered 2021-11-29 – 2021-12-01 (×3): 100 mg
  Filled 2021-11-29 (×3): qty 1

## 2021-11-29 MED ORDER — ADULT MULTIVITAMIN W/MINERALS CH
1.0000 | ORAL_TABLET | Freq: Every day | ORAL | Status: DC
  Administered 2021-11-29 – 2021-12-01 (×3): 1
  Filled 2021-11-29 (×3): qty 1

## 2021-11-29 NOTE — Progress Notes (Signed)
Informed Dr. Loanne Drilling of patient's increasing/unstable agitation, fighting the ventilator, and desaturation into the 80's with PRN versed/fentanyl already given, increasing propofol and fentanyl. Requesting to give versed PRN sooner. Per Dr. Loanne Drilling to go ahead and max fentanyl gtt to 200 mcg and give versed PRN at this time.

## 2021-11-29 NOTE — Progress Notes (Signed)
  Echocardiogram 2D Echocardiogram has been performed.  James [name] 11/29/2021, 9:12 AM

## 2021-11-29 NOTE — Consult Note (Signed)
Cardiology Consultation:   Patient ID: CONG HIGHTOWER; [id number]; 06/30/1960   Admit date: 11/28/2021 Date of Consult: 11/29/2021  Primary Care Provider: [name], MD Primary Cardiologist: Evalina Field, MD Primary Electrophysiologist: None   Patient Profile:   James [name] is a 61 y.o. male with a history of COPD, hypertension, type 2 diabetes mellitus, pneumonia, posttraumatic pneumothorax, aortic and coronary artery calcification by CT imaging who is being seen today for the evaluation of abnormal high-sensitivity troponin I levels at the request of Dr. Loanne Drilling.  History of Present Illness:   James [name] is currently admitted with acute on chronic hypoxemic/hypercarbic respiratory failure in the setting of rhinovirus and bacterial pneumonia with COPD exacerbation.  He is currently requiring ventilatory support.  Cardiac enzymes have trended abnormally during his work-up with peak high-sensitivity troponin I up to 1951, initially 467.  ECG shows sinus rhythm with rightward axis and nonspecific T wave changes.  Chest x-ray demonstrates airspace disease within the mid and lower right lung.  He does have coronary and aortic atherosclerosis evident by CT imaging, no previously documented obstructive CAD.  He was seen in the cardiology office for follow-up in March.  No prior ischemic testing noted.  Echocardiogram from October 2021 revealed LVEF 60 to 65% with moderate LVH, mildly dilated aortic root at 41 mm, and no significant valvular abnormalities.  Chest CT in June demonstrated several new pulmonary nodules with serial imaging recommended.  Past Medical History:  Diagnosis Date   Acute on chronic respiratory failure with hypoxia ([place]) 06/08/2013   CAP (community acquired pneumonia) 05/17/2018   See admit 05/17/18 ? rml  with   covid pcr neg - rx augmentin > f/u cxr in 4-6 weeks is fine unless condition declines    Community acquired pneumonia of right lower lobe of  lung 05/17/2018   See admit 05/17/18 ? 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   Recurrent pneumonia 02/20/2021   Tick bite 06/03/2018    Past Surgical History:  Procedure Laterality Date   LUNG REMOVAL, PARTIAL     VIDEO ASSISTED THORACOSCOPY (VATS)/THOROCOTOMY Left 06/11/2013   Procedure: VIDEO ASSISTED THORACOSCOPY (VATS)/THOROCOTOMY;  Surgeon: Ivin Poot, MD;  Location: [place];  Service: Thoracic;  Laterality: Left;   VIDEO BRONCHOSCOPY N/A 06/11/2013   Procedure: VIDEO BRONCHOSCOPY;  Surgeon: Ivin Poot, MD;  Location: [place];  Service: Thoracic;  Laterality: N/A;   VIDEO BRONCHOSCOPY N/A 08/10/2018   Procedure: VIDEO BRONCHOSCOPY WITH FLUORO;  Surgeon: Candee Furbish, MD;  Location: [facility];  Service: Endoscopy;  Laterality: N/A;     Inpatient Medications: Scheduled Meds:  arformoterol  15 mcg Nebulization BID   budesonide (PULMICORT) nebulizer solution  0.25 mg Nebulization BID   Chlorhexidine Gluconate Cloth  6 each Topical Daily   docusate  100 mg Per Tube BID   enoxaparin (LOVENOX) injection  90 mg Subcutaneous Q12H   feeding supplement (VITAL HIGH PROTEIN)  1,000 mL Per Tube Q24H   insulin aspart  0-20 Units Subcutaneous Q4H  ipratropium-albuterol  3 mL Nebulization Q6H   methylPREDNISolone (SOLU-MEDROL) injection  40 mg Intravenous Q12H   mouth rinse  15 mL Mouth Rinse Q2H   pantoprazole (PROTONIX) IV  40 mg Intravenous Daily   polyethylene glycol  17 g Per Tube Daily   Continuous Infusions:  sodium chloride     sodium chloride 20 mL/hr at 11/29/21  8338   sodium chloride Stopped (11/29/21 0648)   ampicillin-sulbactam (UNASYN) IV Stopped (11/29/21 0557)   fentaNYL infusion INTRAVENOUS 100 mcg/hr (11/29/21 0822)   norepinephrine (LEVOPHED) Adult infusion Stopped (11/29/21 2505)   propofol (DIPRIVAN) infusion 25 mcg/kg/min (11/29/21 0822)   PRN Meds: sodium chloride, acetaminophen (TYLENOL) oral liquid 160 mg/5 mL, albuterol, fentaNYL, haloperidol lactate, midazolam, ondansetron (ZOFRAN) IV, mouth rinse  Allergies:   No Known Allergies  Social History:   Social History   Socioeconomic History   Marital status: Divorced    Spouse name: Not on file   Number of children: Not on file   Years of education: Not on file   Highest education level: Not on file  Occupational History   Occupation: Dealer   Occupation: Curator  Tobacco Use   Smoking status: Former    Packs/day: 1.50    Years: 40.00    Total pack years: 60.00    Types: Cigarettes    Quit date: 2014    Years since quitting: 9.9   Smokeless tobacco: Former    Types: Nurse, children's Use: Never used  Substance and Sexual Activity   Alcohol use: Not Currently    Alcohol/week: 28.0 standard drinks of alcohol    Types: 28 Cans of beer per week    Comment: 4 beers a night previously   Drug use: Not Currently    Types: Marijuana    Comment: daily; quit using crack and methamphetamine about 5 months ago    Sexual activity: Not Currently    Partners: Female  Other Topics Concern   Not on file  Social History Narrative   Lives alone near [place] Determinants of Health   Financial Resource Strain: Low Risk  (12/25/2020)   Overall Financial Resource Strain (CARDIA)    Difficulty of Paying Living Expenses: Not very hard  Recent Concern: Emergency planning/management officer Strain - High Risk (10/29/2020)   Overall Financial Resource Strain (CARDIA)    Difficulty of Paying Living Expenses: Hard  Food Insecurity: No Food Insecurity (10/27/2021)   Hunger Vital  Sign    Worried About Running Out of Food in the Last Year: Never true    Ran Out of Food in the Last Year: Never true  Transportation Needs: No Transportation Needs (10/27/2021)   PRAPARE - Hydrologist (Medical): No    Lack of Transportation (Non-Medical): No  Physical Activity: Inactive (12/11/2020)   Exercise Vital Sign    Days of Exercise per Week: 0 days    Minutes of Exercise per Session: 0 min  Stress: No Stress Concern Present (12/25/2020)   [place]    Feeling of Stress : Not at all  Social Connections: Moderately Integrated (11/13/2020)   Social Connection and Isolation Panel [NHANES]    Frequency of Communication with Friends and Family: More than three times a week    Frequency of Social Gatherings with Friends and Family: Once a week    Attends Religious Services: 1 to 4 times per year    Active Member  of Clubs or Organizations: Yes    Attends Archivist Meetings: 1 to 4 times per year    Marital Status: Divorced  Intimate Partner Violence: Not At Risk (10/27/2021)   Humiliation, Afraid, Rape, and Kick questionnaire    Fear of Current or Ex-Partner: No    Emotionally Abused: No    Physically Abused: No    Sexually Abused: No    Family History:   The patient's family history includes COPD in his sister; Heart disease in his father.  ROS:  Unable to obtain as patient presently sedated on the ventilator.  Physical Exam/Data:   Vitals:   11/29/21 0400 11/29/21 0500 11/29/21 0600 11/29/21 0800  BP: 111/72  104/70 105/64  Pulse: (!) 59 (!) 57 61 84  Resp: (!) 22 (!) 22 (!) 22 (!) 22  Temp: 98.2 F (36.8 C) 98.1 F (36.7 C) 97.9 F (36.6 C) 98.4 F (36.9 C)  TempSrc: Bladder     SpO2: 100% 99% 100% 91%  Weight:  93.2 kg    Height:        Intake/Output Summary (Last 24 hours) at 11/29/2021 0859 Last data filed at 11/29/2021 8938 Gross per 24 hour   Intake 5866.89 ml  Output 1100 ml  Net 4766.89 ml   Filed Weights   11/28/21 0502 11/28/21 1615 11/29/21 0500  Weight: 95.3 kg 91.8 kg 93.2 kg   Body mass index is 30.34 kg/m.   Gen: Intubated on the ventilator.  No distress. HEENT: Conjunctiva and lids normal. Neck: Supple, no carotid bruits. Lungs: Decreased breath sounds without active wheezing. Cardiac: Regular rate and rhythm, no S3 or significant systolic murmur, no pericardial rub. Abdomen: Soft, nontender, bowel sounds present. Extremities: No pitting edema, distal pulses 2+. Skin: Warm and dry. Musculoskeletal: No kyphosis. Neuropsychiatric: Sedated.  EKG:  An ECG dated 11/28/2021 was personally reviewed today and demonstrated:  Sinus rhythm with rightward axis, nonspecific T wave changes.  Telemetry:  I personally reviewed telemetry which shows sinus rhythm.  Relevant CV Studies:  Echocardiogram 10/19/2019: 1. Left ventricular ejection fraction, by estimation, is 60 to 65%. The  left ventricle has normal function. The left ventricle has no regional  wall motion abnormalities. There is moderate left ventricular hypertrophy.  Left ventricular diastolic  parameters are indeterminate.   2. Right ventricular systolic function is normal. The right ventricular  size is normal. Tricuspid regurgitation signal is inadequate for assessing  PA pressure.   3. The mitral valve is normal in structure. No evidence of mitral valve  regurgitation. No evidence of mitral stenosis.   4. The aortic valve is grossly normal. Unable to determine aortic valve  morphology due to image quality. Aortic valve regurgitation is not  visualized. No aortic stenosis is present.   5. Aortic dilatation noted. There is mild dilatation of the aortic root,  measuring 41 mm.   Laboratory Data:  Chemistry Recent Labs  Lab 11/28/21 0625 11/28/21 0841 11/28/21 0929  NA 139 139 139  K 5.2* 5.2* 5.3*  CL 102  --   --   CO2 27  --   --    GLUCOSE 239*  --   --   BUN 20  --   --   CREATININE 1.13  --   --   CALCIUM 9.1  --   --   GFRNONAA >60  --   --   ANIONGAP 10  --   --      Hematology Recent Labs  Lab 11/28/21  0500 11/28/21 0545 11/28/21 0841 11/28/21 0929 11/29/21 0500  WBC 24.6*  --   --   --  22.4*  RBC 5.22  --   --   --  3.68*  HGB 15.6   < > 12.9* 13.6 10.8*  HCT 48.4   < > 38.0* 40.0 35.2*  MCV 92.7  --   --   --  95.7  MCH 29.9  --   --   --  29.3  MCHC 32.2  --   --   --  30.7  RDW 14.8  --   --   --  14.5  PLT 429*  --   --   --  259   < > = values in this interval not displayed.   Cardiac Enzymes Recent Labs  Lab 11/28/21 0500 11/28/21 0739 11/28/21 1806 11/28/21 2013  TROPONINIHS 20* 467* 1,676* 1,951*   BNP Recent Labs  Lab 11/28/21 0500  BNP 50.2     Radiology/Studies:  DG Chest Port 1 View  Result Date: 11/29/2021 CLINICAL DATA:  Respiratory failure. EXAM: PORTABLE CHEST 1 VIEW COMPARISON:  11/28/2021 FINDINGS: The ET tube tip is above the carina. Nasogastric tube and side port are below the GE junction. Stable cardiomediastinal contours. Persistent airspace disease is identified within the right mid and right lower lung. Similar appearance of chronic bilateral rib fractures and left clavicle fracture. IMPRESSION: 1. Persistent airspace disease within the right lung. 2. Stable support apparatus. Electronically Signed   By: Kerby Moors M.D.   On: 11/29/2021 06:53   DG Chest Port 1 View  Result Date: 11/28/2021 CLINICAL DATA:  Shortness of breath.  Evaluate ET tube placement. EXAM: PORTABLE CHEST 1 VIEW COMPARISON:  10/26/2021 FINDINGS: ETT tip is 3 cm above the carina. Enteric tube tip and side port are in the stomach below the GE junction. Normal heart size. Aortic atherosclerosis. Persistent and progressive airspace disease is identified within the right mid and right lower lung. Left lung appears clear. Numerous bilateral chronic rib fracture deformities. Remote distal left  clavicle fracture. IMPRESSION: 1. Satisfactory position of ETT and enteric tube. 2. Persistent and progressive airspace disease within the right mid and right lower lung. Imaging findings are concerning for pneumonia. Follow-up imaging is recommended to ensure complete resolution and to exclude underlying malignancy. Electronically Signed   By: Kerby Moors M.D.   On: 11/28/2021 06:36    Assessment and Plan:   1.  Type II NSTEMI with high-sensitivity troponin I level up to 1951 in the setting of hypoxemic/hypercarbic respiratory failure with rhinovirus, bacterial pneumonia, and COPD exacerbation.  ECG shows no acute ST segment change.  2.  Coronary and aortic calcification evident by CT imaging over time.  No previously documented obstructive CAD, has not undergone any ischemic work-up that I can locate.  Recommend supportive measures for now aimed at managing pulmonary status and comorbidities.  Obtain echocardiogram to reassess LVEF and evaluate for focal wall motion abnormalities.  He has already been started on treatment dose Lovenox.  We will continue to follow.  Would anticipate eventual treatment with aspirin and statin.  At home he was also on Cozaar and Toprol-XL, both held at present in light of relative hypotension.  Cardiac catheterization not clearly indicated at this particular time, although would anticipate some form of ischemic versus anatomical coronary assessment when he is more stable, potentially even as an outpatient.   Signed, Rozann Lesches, MD  11/29/2021 8:59 AM

## 2021-11-29 NOTE — Progress Notes (Addendum)
Informed Dr. Loanne Drilling of patient's prolonged Qtc (560) and continued hypotention when sedation is increased. Patient has EKG changes, EKG obtained with critical Qtc prolongation noted. Per Dr. Loanne Drilling to continue with current plan of care with sedation (lowest settings possible for patient comfort/ventilation toleration), use of Levo IV, and to obtain BMET/Magnesium now.

## 2021-11-29 NOTE — Progress Notes (Signed)
NAME:  James [name], MRN:  [id number], DOB:  12-09-1960, LOS: 1 ADMISSION DATE:  11/28/2021, CONSULTATION DATE:  11/28/2021 REFERRING MD:  Dr. Roxanne Mins, ER, CHIEF COMPLAINT:  Short of breath   History of Present Illness:  61 yo male former smoker presented to ER with dyspnea.  SpO2 on room air 82%.  Started on therapy for COPD exacerbation.  Found to have A fib with HR in 160's, and BP 217/135 in ER.  Tried on Bipap, but continued to have respiratory distress.  Intubated in ER.    Pertinent  Medical History  COPD with emphysema, DM type 2, HTN, Lung nodules, PNA, COVID, Pneumothorax, Post-herpetic neuralgia, ETOH  Significant Hospital Events: Including procedures, antibiotic start and stop dates in addition to other pertinent events   11/25 Admit, intubated, started ABx, start cardizem gtt-->got hypotensive. Converted to SR but started on Neo.  11/26 Overnight transferred from [facility] ED to [facility] ICU  Interim History / Subjective:  On mechanical vent, sedated. Off levo, off neo. Vent dyssynchrony  Overnight, troponin elevated. Treatment dose lovenox ordered  Objective   Blood pressure 104/70, pulse 61, temperature 97.9 F (36.6 C), resp. rate (!) 22, height 5\' 9"  (1.753 m), weight 93.2 kg, SpO2 100 %.    Vent Mode: PRVC FiO2 (%):  [60 %] 60 % Set Rate:  [22 bmp] 22 bmp Vt Set:  [550 mL] 550 mL PEEP:  [5 cmH20] 5 cmH20 Plateau Pressure:  [17 cmH20-26 cmH20] 20 cmH20   Intake/Output Summary (Last 24 hours) at 11/29/2021 0808 Last data filed at 11/29/2021 0759 Gross per 24 hour  Intake 5801.52 ml  Output 1100 ml  Net 4701.52 ml   Filed Weights   11/28/21 0502 11/28/21 1615 11/29/21 0500  Weight: 95.3 kg 91.8 kg 93.2 kg    Physical Exam: General: Critically ill-appearing, no acute distress HENT: [place], AT, ETT in place Eyes: EOMI, no scleral icterus Respiratory: Diminished but clear to auscultation bilaterally.  No crackles, wheezing or rales Cardiovascular: RRR, -M/R/G, no  JVD GI: BS+, soft, nontender Extremities:-Edema,-tenderness Neuro: Drowsy, opens eyes to touch, does not follow commands, moves extremities x 4 GU: Foley in place  CXR 11/29/21 - RLL infiltrate, ETT in place ABG with normal pH, chronic hypercarbic respiratory failure WBC 22, similar to prior BMET pending  Trop elevated to 1951, not peaked  Resolved Hospital Problem list   HTN emergency with acute pulmonary edema.  Assessment & Plan:   Acute on chronic hypoxemic and hypercarbic respiratory failure 2/2 Rhinovirus with superimposed bacteria pneumonia COPD exacerbation -Full vent support. Wean PEEP/FIO2 for goal SpO2 88-92% -SBT/WUA daily when eligible -[place] Day 3 -Nebs: Brovana, Pulmicort, Duonebs -Steroids -PAD protocol -VAP bundle -ABG, CXR PRN  Circulatory Shock favor mix of sepsis/septic shock and medication related hypotension from sedation and CCB Shock - resolved -Off pressors -DC maintenance IVF -Hold AVN blockers for now  A fib with RVR-->currently ST NSTEMI Initially looked like HTN emergency but developed fairly sig hypotension after CCB infusion and lasix  -Cards consulted -Treatment dose lovenox -Hold Dilt. Consider restarting if pressures stable. Otherwise may need amio -F/u echo -Ensure K > 4 and Mg > 2  DM type 2 poorly controlled with steroid induced hyperglycemia. Plan SSI goal 140-180    Best Practice (right click and "Reselect all SmartList Selections" daily)   Diet/type: tubefeeds DVT prophylaxis: prophylactic heparin  GI prophylaxis: PPI Lines: N/A Foley:  N/A Code Status:  full code Last date of multidisciplinary goals of care discussion [  x]   Critical care time: 71min    The patient is critically ill with multiple organ systems failure and requires high complexity decision making for assessment and support, frequent evaluation and titration of therapies, application of advanced monitoring technologies and extensive  interpretation of multiple databases.   Rodman Pickle, M.D. [facility] Pulmonary/Critical Care Medicine 11/29/2021 8:09 AM   Please see Amion for pager number to reach on-call Pulmonary and Critical Care Team.

## 2021-11-29 NOTE — Progress Notes (Signed)
Initial Nutrition Assessment  DOCUMENTATION CODES:   Not applicable  INTERVENTION:  Adjust TF regimen to the following via OGT: Vital 1.5 at 60 ml/h (1440 ml per day) Hold at 58mL/h until CCM ok to advance. Then advance by 55mL q6h to goal rate Prosource TF20 63mL 1x/d Provides 2240 kcal, 117 gm protein, 1100 ml free water daily - kcal from propofol not counted MVI, thiamine, folic acid for hx of EtOH abuse  NUTRITION DIAGNOSIS:   Inadequate oral intake related to inability to eat as evidenced by NPO status.  GOAL:   Patient will meet greater than or equal to 90% of their needs  MONITOR:   I & O's, Labs, Vent status, TF tolerance  REASON FOR ASSESSMENT:   Consult Enteral/tube feeding initiation and management (trickles)  ASSESSMENT:   Pt with hx of DM type 2, HTN, COPD, and EtOH abuse presented to ED with SOB.  11/25 - Intubated, admitted to ICU   Patient is currently intubated on ventilator support. Minimal pressor support at this time. Consult for TF received (trickle feeds). OGT in place (gastric) with Vital High Protein currently infusing at 59mL/h.   Reached out to MD about increasing rate from trickle as pt is more hemodynamically stable. Awaiting a response. Recommendations left for advancement.   MV: 11.6 L/min Temp (24hrs), Avg:98.6 F (37 C), Min:97.7 F (36.5 C), Max:99.3 F (37.4 C)  Propofol: 11.44 ml/hr (302 kcal/d)   Intake/Output Summary (Last 24 hours) at 11/29/2021 1629 Last data filed at 11/29/2021 1553 Gross per 24 hour  Intake 6721.39 ml  Output 1325 ml  Net 5396.39 ml  Net IO Since Admission: 6,718.46 mL [11/29/21 1629]  Nutritionally Relevant Medications: Scheduled Meds:  docusate  100 mg Per Tube BID   feeding supplement (VITAL HIGH PROTEIN)  1,000 mL Per Tube Q24H   insulin aspart  0-20 Units Subcutaneous Q4H   methylPREDNISolone (SOLU-MEDROL) injection  40 mg Intravenous Q12H   pantoprazole (PROTONIX) IV  40 mg Intravenous  Daily   polyethylene glycol  17 g Per Tube Daily   Continuous Infusions:  norepinephrine (LEVOPHED) Adult infusion 2 mcg/min (11/29/21 1553)   propofol (DIPRIVAN) infusion 20 mcg/kg/min (11/29/21 1553)   PRN Meds: ondansetron  Labs Reviewed: BUN 21 CBG ranges from 140-156 mg/dL over the last 24 hours HgbA1c 6.5% (10/9)  NUTRITION - FOCUSED PHYSICAL EXAM: Defer to in-person assessment  Diet Order:   Diet Order             Diet NPO time specified  Diet effective now                   EDUCATION NEEDS:   Not appropriate for education at this time  Skin:  Skin Assessment: Reviewed RN Assessment  Last BM:  prior to admission  Height:   Ht Readings from Last 1 Encounters:  11/28/21 5\' 9"  (1.753 m)    Weight:   Wt Readings from Last 1 Encounters:  11/29/21 93.2 kg    Ideal Body Weight:  72.7 kg  BMI:  Body mass index is 30.34 kg/m.  Estimated Nutritional Needs:   Kcal:  2100-2300 kcal/d  Protein:  110-130g/d  Fluid:  2.2-2.4 L/d    Ranell Patrick, RD, LDN Clinical Dietitian RD pager # available in [place]  After hours/weekend pager # available in [facility]

## 2021-11-30 ENCOUNTER — Inpatient Hospital Stay (HOSPITAL_COMMUNITY): Payer: Medicaid Other

## 2021-11-30 DIAGNOSIS — J440 Chronic obstructive pulmonary disease with acute lower respiratory infection: Secondary | ICD-10-CM | POA: Diagnosis not present

## 2021-11-30 DIAGNOSIS — E440 Moderate protein-calorie malnutrition: Secondary | ICD-10-CM | POA: Diagnosis not present

## 2021-11-30 DIAGNOSIS — J159 Unspecified bacterial pneumonia: Secondary | ICD-10-CM | POA: Diagnosis not present

## 2021-11-30 DIAGNOSIS — I4719 Other supraventricular tachycardia: Secondary | ICD-10-CM | POA: Diagnosis not present

## 2021-11-30 DIAGNOSIS — A419 Sepsis, unspecified organism: Secondary | ICD-10-CM | POA: Diagnosis not present

## 2021-11-30 DIAGNOSIS — J9602 Acute respiratory failure with hypercapnia: Secondary | ICD-10-CM | POA: Diagnosis not present

## 2021-11-30 DIAGNOSIS — I21A1 Myocardial infarction type 2: Secondary | ICD-10-CM | POA: Diagnosis not present

## 2021-11-30 DIAGNOSIS — B348 Other viral infections of unspecified site: Secondary | ICD-10-CM | POA: Diagnosis not present

## 2021-11-30 DIAGNOSIS — J9601 Acute respiratory failure with hypoxia: Secondary | ICD-10-CM | POA: Diagnosis not present

## 2021-11-30 DIAGNOSIS — R6521 Severe sepsis with septic shock: Secondary | ICD-10-CM | POA: Diagnosis not present

## 2021-11-30 DIAGNOSIS — I5031 Acute diastolic (congestive) heart failure: Secondary | ICD-10-CM | POA: Diagnosis not present

## 2021-11-30 DIAGNOSIS — Z20822 Contact with and (suspected) exposure to covid-19: Secondary | ICD-10-CM | POA: Diagnosis not present

## 2021-11-30 DIAGNOSIS — J9622 Acute and chronic respiratory failure with hypercapnia: Secondary | ICD-10-CM | POA: Diagnosis not present

## 2021-11-30 DIAGNOSIS — Z8616 Personal history of COVID-19: Secondary | ICD-10-CM | POA: Diagnosis not present

## 2021-11-30 DIAGNOSIS — J9621 Acute and chronic respiratory failure with hypoxia: Secondary | ICD-10-CM | POA: Diagnosis not present

## 2021-11-30 LAB — BLOOD GAS, ARTERIAL
Acid-Base Excess: 4.5 mmol/L — ABNORMAL HIGH (ref 0.0–2.0)
Bicarbonate: 32.2 mmol/L — ABNORMAL HIGH (ref 20.0–28.0)
O2 Saturation: 97.2 %
Patient temperature: 37
pCO2 arterial: 61 mmHg — ABNORMAL HIGH (ref 32–48)
pH, Arterial: 7.33 — ABNORMAL LOW (ref 7.35–7.45)
pO2, Arterial: 76 mmHg — ABNORMAL LOW (ref 83–108)

## 2021-11-30 LAB — GLUCOSE, CAPILLARY
Glucose-Capillary: 127 mg/dL — ABNORMAL HIGH (ref 70–99)
Glucose-Capillary: 135 mg/dL — ABNORMAL HIGH (ref 70–99)
Glucose-Capillary: 139 mg/dL — ABNORMAL HIGH (ref 70–99)
Glucose-Capillary: 161 mg/dL — ABNORMAL HIGH (ref 70–99)
Glucose-Capillary: 165 mg/dL — ABNORMAL HIGH (ref 70–99)
Glucose-Capillary: 181 mg/dL — ABNORMAL HIGH (ref 70–99)
Glucose-Capillary: 193 mg/dL — ABNORMAL HIGH (ref 70–99)

## 2021-11-30 LAB — BASIC METABOLIC PANEL
Anion gap: 7 (ref 5–15)
BUN: 31 mg/dL — ABNORMAL HIGH (ref 6–20)
CO2: 27 mmol/L (ref 22–32)
Calcium: 8.1 mg/dL — ABNORMAL LOW (ref 8.9–10.3)
Chloride: 107 mmol/L (ref 98–111)
Creatinine, Ser: 0.63 mg/dL (ref 0.61–1.24)
GFR, Estimated: >60 mL/min (ref >60)
Glucose, Bld: 171 mg/dL — ABNORMAL HIGH (ref 70–99)
Potassium: 4.5 mmol/L (ref 3.5–5.1)
Sodium: 141 mmol/L (ref 135–145)

## 2021-11-30 LAB — CBC
HCT: 33.9 % — ABNORMAL LOW (ref 39.0–52.0)
Hemoglobin: 10.3 g/dL — ABNORMAL LOW (ref 13.0–17.0)
MCH: 29.5 pg (ref 26.0–34.0)
MCHC: 30.4 g/dL (ref 30.0–36.0)
MCV: 97.1 fL (ref 80.0–100.0)
Platelets: 255 10*3/uL (ref 150–400)
RBC: 3.49 MIL/uL — ABNORMAL LOW (ref 4.22–5.81)
RDW: 14.8 % (ref 11.5–15.5)
WBC: 17.3 10*3/uL — ABNORMAL HIGH (ref 4.0–10.5)
nRBC: 0 % (ref 0.0–0.2)

## 2021-11-30 LAB — PROCALCITONIN: Procalcitonin: 6.32 ng/mL

## 2021-11-30 LAB — LEGIONELLA PNEUMOPHILA SEROGP 1 UR AG: L. pneumophila Serogp 1 Ur Ag: NEGATIVE

## 2021-11-30 MED ORDER — REVEFENACIN 175 MCG/3ML IN SOLN
175.0000 ug | Freq: Every day | RESPIRATORY_TRACT | Status: DC
  Administered 2021-11-30 – 2021-12-08 (×7): 175 ug via RESPIRATORY_TRACT
  Filled 2021-11-30 (×9): qty 3

## 2021-11-30 NOTE — Progress Notes (Signed)
NAME:  James [name], MRN:  [id number], DOB:  January 06, 1960, LOS: 2 ADMISSION DATE:  11/28/2021, CONSULTATION DATE:  11/28/2021 REFERRING MD:  Dr. Roxanne Mins, ER, CHIEF COMPLAINT:  Short of breath   History of Present Illness:  61 year old man who [facility] to the [facility] ED 11/25 with dyspnea.  SpO2 on room air 82%.  Started on therapy for COPD exacerbation.  Found to have AFib with HR in 160s, and BP 217/135 in ER.  Trialed on BiPAP but continued to have respiratory distress. Intubated in ER. Transferred to [facility] PMHx significant for HTN, COPD with emphysema, lung nodules, tobacco use, prior PTX, T2DM, post-herpetic neuralgia, EtOH use.  Pertinent Medical History:  COPD with emphysema, DM type 2, HTN, Lung nodules, PNA, COVID, Pneumothorax, Post-herpetic neuralgia, ETOH  Significant Hospital Events: Including procedures, antibiotic start and stop dates in addition to other pertinent events   11/25 Admit, intubated, started ABx, start cardizem gtt-->got hypotensive. Converted to SR but started on Neo.  11/26 Overnight transferred from [facility] ED to [facility] ICU 11/27 Remains intubated on Prop/Versed for sedation; increased secretions with high peak pressures on vent, Resp Cx sent.  Interim History / Subjective:  Transferred from [facility] to [facility] overnight Remains intubated, sedated on Prop/Fent/Versed Increased secretions and worsening hypoxia today Peak pressures intermittently 30s-40s Increased FiO2, resp Cx sent Bedside US of RLL showing dense consolidation, no drainable fluid  Objective:  Blood pressure 126/62, pulse 92, temperature 98.4 F (36.9 C), resp. rate (!) 21, height 5\' 9"  (1.753 m), weight 93.2 kg, SpO2 99 %.    Vent Mode: PRVC FiO2 (%):  [60 %] 60 % Set Rate:  [22 bmp] 22 bmp Vt Set:  [540 mL] 540 mL PEEP:  [5 cmH20] 5 cmH20 Plateau Pressure:  [17 cmH20-32 cmH20] 17 cmH20   Intake/Output Summary (Last 24 hours) at 11/30/2021 0745 Last data filed at 11/30/2021 4098 Gross per 24 hour   Intake 3050.82 ml  Output 1225 ml  Net 1825.82 ml    Filed Weights   11/28/21 0502 11/28/21 1615 11/29/21 0500  Weight: 95.3 kg 91.8 kg 93.2 kg   Physical Examination: General: Acutely ill-appearing middle-aged man in NAD. Intubated, sedated. HEENT: [place]/AT, anicteric sclera, L subconjunctival hemorrhage noted; PERRL 61mm, moist mucous membranes. ETT/OGT in place. Neuro: Sedated. Does not respond to verbal, tactile or noxious stimuli. Does not withdraw to pain in the setting of sedation. Not following commands. No spontaneous movement of extremities noted. +Weak cough, no gag.  CV: RRR, no m/g/r. PULM: Breathing even and unlabored on vent (PEEP 5, FiO2 60%). Lung fields diminished throughout, R > L. GI: Firm, nontender, mildly distended. Normoactive bowel sounds. Extremities: Trace bilateral symmetric LE edema noted. Skin: Warm/dry, no rashes.  Resolved Hospital Problem List:  HTN emergency with acute pulmonary edema  Assessment & Plan:   Acute on chronic hypoxemic and hypercarbic respiratory failure 2/2 Rhinovirus with superimposed bacteria pneumonia COPD exacerbation - Continue full vent support (4-8cc/kg IBW) - Wean FiO2 for O2 sat 88-92% - Daily WUA/SBT as sedation weaning permits - VAP bundle - Bronchodilators (Brovana, Pulmicort, DuoNebs - Continue Solumedrol - Pulmonary hygiene - PAD protocol for sedation: Propofol, Fentanyl, and Versed for goal RASS 0 to -1 - Continue Unasyn - F/u Resp Cx - Follow CXR, ABG  Circulatory Shock favor mix of sepsis/septic shock and medication related hypotension from sedation and CCB Shock - resolved - Goal MAP > 65 - Fluid resuscitation as tolerated - Remains off of pressors at present, hold AV node  blockers - Trend WBC, fever curve - F/u Cx data - Continue broad-spectrum antibiotics as above  A fib with RVR-->currently ST NSTEMI Initially looked like HTN emergency but developed fairly sig hypotension after CCB infusion and  Lasix. Echo with LVEF 50-55%, no RWMAs, G1DD. - Cardiology consulted - Lovenox (treatment dose) - Hold diltiazem, resume as BP tolerates - May require amiodarone if unable to tolerate dilt - Optimize electrolytes (K > 4, Mg > 2)  DM type 2 poorly controlled with steroid induced hyperglycemia. - SSI - CBGs Q4H - Goal CBG 140-180  Best Practice: (right click and "Reselect all SmartList Selections" daily)   Diet/type: tubefeeds DVT prophylaxis: prophylactic heparin  GI prophylaxis: PPI Lines: N/A Foley:  N/A Code Status:  full code Last date of multidisciplinary goals of care discussion [x]   Critical care time: 44 minutes   Lestine Mount, PA-C [place] [facility] 11/30/21 7:45 AM  Please see [email or web address] for pager details.  From 7A-7P if no response, please call [phone] After hours, please call ELink [phone]

## 2021-11-30 NOTE — TOC Initial Note (Signed)
Transition of Care [facility]) - Initial/Assessment Note    Patient Details  Name: James [name] MRN: [id number] Date of Birth: Jul 24, 1960  [facility] of [facility]) CM/SW Contact:    Leeroy Cha, RN Phone Number: 11/30/2021, 7:40 AM  Clinical Narrative:                 Patient on vent at 60%.  Plan tbd once stable.  Expected Discharge Plan: Home/Self Care Barriers to Discharge: Continued Medical Work up   Patient Goals and CMS Choice Patient states their goals for this hospitalization and ongoing recovery are:: unable to state due to vent      Expected Discharge Plan and Services Expected Discharge Plan: Home/Self Care   Discharge Planning Services: CM Consult   Living arrangements for the past 2 months: Single Family Home                                      Prior Living Arrangements/Services Living arrangements for the past 2 months: Single Family Home Lives with:: Self Patient language and need for interpreter reviewed:: Yes              Criminal Activity/Legal Involvement Pertinent to Current Situation/Hospitalization: No - Comment as needed  Activities of Daily Living      Permission Sought/Granted                  Emotional Assessment Appearance:: Appears stated age Attitude/Demeanor/Rapport: Unable to Assess Affect (typically observed): Unable to Assess Orientation: : Fluctuating Orientation (Suspected and/or reported [place])   Psych Involvement: No (comment)  Admission diagnosis:  Respiratory failure ([place]) [J96.90] Elevated troponin [R79.89] COPD exacerbation (HCC) [J44.1] Acute respiratory failure with hypoxia and hypercapnia (HCC) [J96.01, J96.02] Elevated blood pressure reading with diagnosis of hypertension [I10] Patient Active Problem List   Diagnosis Date Noted   Respiratory failure ([place]) 11/28/2021   COPD with pneumonia ([place]) 10/11/2021   Tinea cruris 07/21/2021   COPD with acute exacerbation ([place]) 07/12/2021    Community acquired pneumonia 07/12/2021   Multiple lung nodules on CT 06/11/2021   COPD with chronic bronchitis 02/01/2021   GERD (gastroesophageal reflux disease) 02/01/2021   Neuropathy 09/29/2020   Bronchiectasis ([place]) 09/24/2019   Type 2 diabetes mellitus without complication, without long-term current use of insulin ([place]) 07/24/2019   Other atopic dermatitis 05/01/2019   Chronic respiratory failure with hypoxia ([place]) 04/23/2019   Intellectual disability 04/23/2019   Alcohol use disorder 11/23/2018   COPD exacerbation ([place]) 02/16/2018   Hypertension 05/17/2016   Tobacco use disorder 04/06/2016   COPD mixed type ([place]) 03/26/2016   Acute on chronic respiratory failure with hypoxia and hypercapnia ([place]) 06/08/2013   PCP:  [name], MD Pharmacy:   [facility] [address] [place] [address] Phone: [phone] Fax: [place] [address] [place] [address] Phone: [phone] Fax: [phone]  [facility] - [place], [place] - [address] [place] [address] Phone: [phone] Fax: [phone]  [id number] [place], [place] [address] [place] [address] Phone: [phone] Fax: [phone]  [place] [address] Phone: [phone] Fax: [phone]     Social Determinants  of Health (SDOH) Interventions    Readmission Risk Interventions   Row Labels 02/04/2021   12:44 PM 08/14/2020    9:59 AM 04/24/2020    1:03 PM  Readmission Risk Prevention Plan   Section Header. No data exists in this row.     Post Dischage Appt   Complete    Medication Screening   Complete    Transportation Screening   Complete Complete Complete  PCP  or Specialist Appt within 3-5 Days    Complete   Not Complete comments    patient already has an appointment schedules with [name]   [facility] or [place]    Complete   Social Work Consult for [place] Planning/Counseling    Complete   Palliative Care Screening    Not Applicable   Medication Review (RN Care Manager)    Complete   PCP or Specialist appointment within 3-5 days of discharge     Not Complete  PCP/Specialist Appt Not Complete comments     Pt had existing appt 5/23--this is first available per [facility]  [place] or [place]     Complete  SW Recovery Care/Counseling Consult     Complete  Palliative Care Screening     Complete  [place]     Not Applicable

## 2021-11-30 NOTE — Progress Notes (Addendum)
Rounding Note    Patient Name: James [name] Date of Encounter: 11/30/2021  [place] Cardiologist: Evalina Field, MD   Subjective   No acute overnight events. Still intubated and sedated.  Inpatient Medications    Scheduled Meds:  arformoterol  15 mcg Nebulization BID   budesonide (PULMICORT) nebulizer solution  0.25 mg Nebulization BID   Chlorhexidine Gluconate Cloth  6 each Topical Daily   docusate  100 mg Per Tube BID   enoxaparin (LOVENOX) injection  90 mg Subcutaneous Q12H   feeding supplement (PROSource TF20)  60 mL Per Tube Daily   folic acid  1 mg Per Tube Daily   insulin aspart  0-20 Units Subcutaneous Q4H   ipratropium-albuterol  3 mL Nebulization Q6H   methylPREDNISolone (SOLU-MEDROL) injection  40 mg Intravenous Q12H   multivitamin with minerals  1 tablet Per Tube Daily   mouth rinse  15 mL Mouth Rinse Q2H   pantoprazole (PROTONIX) IV  40 mg Intravenous Daily   polyethylene glycol  17 g Per Tube Daily   thiamine  100 mg Per Tube Daily   Continuous Infusions:  sodium chloride     sodium chloride 20 mL/hr at 11/30/21 0708   sodium chloride Stopped (11/29/21 0648)   ampicillin-sulbactam (UNASYN) IV 200 mL/hr at 11/30/21 0708   feeding supplement (VITAL 1.5 CAL) 60 mL/hr at 11/30/21 0708   fentaNYL infusion INTRAVENOUS 200 mcg/hr (11/30/21 0708)   norepinephrine (LEVOPHED) Adult infusion Stopped (11/30/21 8099)   propofol (DIPRIVAN) infusion 25 mcg/kg/min (11/30/21 0708)   PRN Meds: sodium chloride, acetaminophen (TYLENOL) oral liquid 160 mg/5 mL, albuterol, fentaNYL, midazolam, ondansetron (ZOFRAN) IV, mouth rinse   Vital Signs    Vitals:   11/30/21 0445 11/30/21 0454 11/30/21 0500 11/30/21 0600  BP:    126/62  Pulse: 77 79 77 92  Resp: (!) 23 (!) 22 (!) 22 (!) 21  Temp: 98.4 F (36.9 C) 98.4 F (36.9 C) 98.4 F (36.9 C) 98.4 F (36.9 C)  TempSrc:      SpO2: 99% 97% 97% 99%  Weight:      Height:        Intake/Output Summary  (Last 24 hours) at 11/30/2021 0710 Last data filed at 11/30/2021 0708 Gross per 24 hour  Intake 3037.61 ml  Output 1225 ml  Net 1812.61 ml      11/29/2021    5:00 AM 11/28/2021    4:15 PM 11/28/2021    5:02 AM  Last 3 Weights  Weight (lbs) 205 lb 7.5 oz 202 lb 6.1 oz 210 lb  Weight (kg) 93.2 kg 91.8 kg 95.255 kg      Telemetry    Normal sinus rhythm with rate mostly in the 60s to 80s. Two brief episodes of SVT/ atrial tachycardia with rates in the 160s noted. - Personally Reviewed  ECG    No new ECG tracing today. - Personally Reviewed  Physical Exam   GEN: Intubated and sedated. Neck: No JVD. Cardiac: RRR. No murmurs, rubs, or gallops. Radial pulses 2+ and equal bilaterally. Respiratory: Intubated. Clear to auscultation bilaterally. No wheezes, rhonchi, or rales noted. GI: Soft, mildly distended, and non-tender. MS: Trace lower extremity edema bilaterally. No deformity. Skin: Warm and dry. Neuro:  Intubated and sedated. Psych: Intubated and sedated.  Labs    High Sensitivity Troponin:   Recent Labs  Lab 11/28/21 0500 11/28/21 0739 11/28/21 1806 11/28/21 2013 11/29/21 0807  TROPONINIHS 20* 467* 1,676* 1,951* 1,296*     Chemistry  Recent Labs  Lab 11/29/21 0500 11/29/21 1630 11/30/21 0447  NA 142 141 141  K 4.8 4.6 4.5  CL 107 106 107  CO2 27 28 27   GLUCOSE 149* 167* 171*  BUN 21* 26* 31*  CREATININE 0.62 0.89 0.63  CALCIUM 8.3* 8.3* 8.1*  MG 2.3 2.2  --   GFRNONAA >60 >60 >60  ANIONGAP 8 7 7     Lipids  Recent Labs  Lab 11/29/21 0500  TRIG 85    Hematology Recent Labs  Lab 11/28/21 0500 11/28/21 0545 11/28/21 0929 11/29/21 0500 11/30/21 0447  WBC 24.6*  --   --  22.4* 17.3*  RBC 5.22  --   --  3.68* 3.49*  HGB 15.6   < > 13.6 10.8* 10.3*  HCT 48.4   < > 40.0 35.2* 33.9*  MCV 92.7  --   --  95.7 97.1  MCH 29.9  --   --  29.3 29.5  MCHC 32.2  --   --  30.7 30.4  RDW 14.8  --   --  14.5 14.8  PLT 429*  --   --  259 255   < > =  values in this interval not displayed.   Thyroid No results for input(s): "TSH", "FREET4" in the last 168 hours.  BNP Recent Labs  Lab 11/28/21 0500  BNP 50.2    DDimer No results for input(s): "DDIMER" in the last 168 hours.   Radiology    ECHOCARDIOGRAM COMPLETE  Result Date: 11/29/2021    ECHOCARDIOGRAM REPORT   Patient Name:   James [name] Date of Exam: 11/29/2021 Medical Rec #:  [id number]      Height:       69.0 in Accession #:    [id number]     Weight:       205.5 lb Date of Birth:  [birth year]      BSA:          2.090 m Patient Age:    61 years       BP:           105/64 mmHg Patient Gender: M              HR:           80 bpm. Exam Location:  Inpatient Procedure: 2D Echo and Intracardiac Opacification Agent Indications:    atrial fibrillation  History:        Patient has prior history of Echocardiogram examinations, most                 recent 10/19/2019. COPD; Risk Factors:Current Smoker,                 Hypertension and Diabetes.  Sonographer:    Harvie Junior Referring Phys: Toney Sang SOOD  Sonographer Comments: Technically difficult study due to poor echo windows, echo performed with patient supine and on artificial respirator and patient is obese. Image acquisition challenging due to COPD, Image acquisition challenging due to patient body  habitus and Image acquisition challenging due to respiratory motion. IMPRESSIONS  1. Left ventricular ejection fraction, by estimation, is 50 to 55%. The left ventricle has low normal function. The left ventricle has no regional wall motion abnormalities. Left ventricular diastolic parameters are consistent with Grade I diastolic dysfunction (impaired relaxation).  2. Right ventricular systolic function is normal. The right ventricular size is normal. Tricuspid regurgitation signal is inadequate for assessing PA pressure.  3. The mitral valve is normal in structure.  No evidence of mitral valve regurgitation.  4. The aortic valve is grossly normal.  Aortic valve regurgitation is not visualized. No aortic stenosis is present.  5. The inferior vena cava is normal in size with greater than 50% respiratory variability, suggesting right atrial pressure of 3 mmHg. Comparison(s): Prior images reviewed side by side. The left ventricular systolic function is worsened. but at the lower limit of normal. FINDINGS  Left Ventricle: Left ventricular ejection fraction, by estimation, is 50 to 55%. The left ventricle has low normal function. The left ventricle has no regional wall motion abnormalities. Definity contrast agent was given IV to delineate the left ventricular endocardial borders. The left ventricular internal cavity size was normal in size. There is no left ventricular hypertrophy. Left ventricular diastolic parameters are consistent with Grade I diastolic dysfunction (impaired relaxation). Normal  left ventricular filling pressure. Right Ventricle: The right ventricular size is normal. No increase in right ventricular wall thickness. Right ventricular systolic function is normal. Tricuspid regurgitation signal is inadequate for assessing PA pressure. Left Atrium: Left atrial size was normal in size. Right Atrium: Right atrial size was normal in size. Pericardium: There is no evidence of pericardial effusion. Presence of epicardial fat layer. Mitral Valve: The mitral valve is normal in structure. No evidence of mitral valve regurgitation. Tricuspid Valve: The tricuspid valve is grossly normal. Tricuspid valve regurgitation is not demonstrated. Aortic Valve: The aortic valve is grossly normal. Aortic valve regurgitation is not visualized. No aortic stenosis is present. Aortic valve mean gradient measures 3.0 mmHg. Aortic valve peak gradient measures 4.8 mmHg. Aortic valve area, by VTI measures 2.94 cm. Pulmonic Valve: The pulmonic valve was not well visualized. Aorta: The aortic root is normal in size and structure. Venous: The inferior vena cava is normal in size  with greater than 50% respiratory variability, suggesting right atrial pressure of 3 mmHg. IAS/Shunts: The interatrial septum was not well visualized.  LEFT VENTRICLE PLAX 2D LVIDd:         4.30 cm      Diastology LVIDs:         3.10 cm      LV e' medial:    7.62 cm/s LV PW:         1.10 cm      LV E/e' medial:  6.5 LV IVS:        1.10 cm      LV e' lateral:   9.14 cm/s LVOT diam:     2.10 cm      LV E/e' lateral: 5.4 LV SV:         55 LV SV Index:   26 LVOT Area:     3.46 cm  LV Volumes (MOD) LV vol d, MOD A2C: 57.4 ml LV vol d, MOD A4C: 125.0 ml LV vol s, MOD A4C: 52.8 ml LV SV MOD A4C:     125.0 ml RIGHT VENTRICLE RV Basal diam:  4.10 cm RV Mid diam:    3.90 cm TAPSE (M-mode): 1.6 cm LEFT ATRIUM             Index        RIGHT ATRIUM           Index LA diam:        3.30 cm 1.58 cm/m   RA Area:     12.60 cm LA Vol (A2C):   46.9 ml 22.44 ml/m  RA Volume:   29.30 ml  14.02 ml/m LA Vol (A4C):   40.7  ml 19.48 ml/m LA Biplane Vol: 48.0 ml 22.97 ml/m  AORTIC VALVE                    PULMONIC VALVE AV Area (Vmax):    2.80 cm     PV Vmax:       0.83 m/s AV Area (Vmean):   2.77 cm     PV Peak grad:  2.8 mmHg AV Area (VTI):     2.94 cm AV Vmax:           110.00 cm/s AV Vmean:          78.200 cm/s AV VTI:            0.186 m AV Peak Grad:      4.8 mmHg AV Mean Grad:      3.0 mmHg LVOT Vmax:         88.80 cm/s LVOT Vmean:        62.600 cm/s LVOT VTI:          0.158 m LVOT/AV VTI ratio: 0.85  AORTA Ao Root diam: 3.70 cm MITRAL VALVE MV Area (PHT): 2.60 cm    SHUNTS MV Decel Time: 292 msec    Systemic VTI:  0.16 m MV E velocity: 49.30 cm/s  Systemic Diam: 2.10 cm MV A velocity: 83.10 cm/s MV E/A ratio:  0.59 Mihai Croitoru MD Electronically signed by Sanda Klein MD Signature Date/Time: 11/29/2021/12:21:12 PM    Final    DG Abd 1 View  Result Date: 11/29/2021 CLINICAL DATA:  Orogastric tube placement. EXAM: ABDOMEN - 1 VIEW COMPARISON:  October 19, 2019. FINDINGS: The bowel gas pattern is normal. Distal tip of  nasogastric tube is seen in expected position of the stomach. No radio-opaque calculi or other significant radiographic abnormality are seen. IMPRESSION: Distal tip of nasogastric tube seen in expected position of the stomach. Electronically Signed   By: Marijo Conception M.D.   On: 11/29/2021 10:43   DG Chest Port 1 View  Result Date: 11/29/2021 CLINICAL DATA:  Respiratory failure. EXAM: PORTABLE CHEST 1 VIEW COMPARISON:  11/28/2021 FINDINGS: The ET tube tip is above the carina. Nasogastric tube and side port are below the GE junction. Stable cardiomediastinal contours. Persistent airspace disease is identified within the right mid and right lower lung. Similar appearance of chronic bilateral rib fractures and left clavicle fracture. IMPRESSION: 1. Persistent airspace disease within the right lung. 2. Stable support apparatus. Electronically Signed   By: Kerby Moors M.D.   On: 11/29/2021 06:53    Cardiac Studies   Echocardiogram 11/29/2021: Impressions:  1. Left ventricular ejection fraction, by estimation, is 50 to 55%. The  left ventricle has low normal function. The left ventricle has no regional  wall motion abnormalities. Left ventricular diastolic parameters are  consistent with Grade I diastolic  dysfunction (impaired relaxation).   2. Right ventricular systolic function is normal. The right ventricular  size is normal. Tricuspid regurgitation signal is inadequate for assessing  PA pressure.   3. The mitral valve is normal in structure. No evidence of mitral valve  regurgitation.   4. The aortic valve is grossly normal. Aortic valve regurgitation is not  visualized. No aortic stenosis is present.   5. The inferior vena cava is normal in size with greater than 50%  respiratory variability, suggesting right atrial pressure of 3 mmHg.   Comparison(s): Prior images reviewed side by side. The left ventricular  systolic function is worsened. but at the lower  limit of normal.     Patient Profile     61 y.o. male with a history of severe COPD on home O2, hypertension, hyperlipidemia, type 2 diabetes mellitus, and alcohol abuse who was admitted on 11/28/2021 with acute hypoxic respiratory failure secondary to COPD exacerbation and community acquired pneumonia requiring intubation. Cardiology was consulted for further evaluation of elevated troponin.  Assessment & Plan    Type II NSTEMI Coronary Artery Calcifications Patient was admitted with acute on chronic hypoxic respiratory failure secondary to COPD exacerbation and CAP requiring intubation and was found to have an elevated troponin. High-sensitivity troponin peaked at 1,951 and has trended down. Initial EKG showed sinus tachycardia with non-specific T wave changes. However, repeat EKG on 11/26 shows normal sinus rhythm with deep T wave inversions in inferior leads and pre-cardial leads which are new. Echo shows LVEF of 50-55% with no regional wall motion abnormalities and grade 1 diastolic dysfunction. He has no documented obstructive CAD but coronary and aortic calcifications have been noted on prior CT scans. - Still intubated and sedated. - Will repeat EKG today. - Currently on treatment dose of Lovenox. Ok to continue this for 48 hours which would mean last dose would be tonight. - Patient is currently still intubated. Would plan to start aspirin and statin when able. - Felt to likely be type 2 NSTEMI (demand ischemia) secondary to underlying illness. Cardiac catheterization not felt to be necessary at this time. However, would ultimately benefit from an ischemic evaluation when he recovers from acute illness and is more stable (this can likely be done as an outpatient).  Questionable Atrial Fibrillation There was initially concerns that patient was in rapid atrial fibrillation on admission. However, upon review of EKGs looks more like sinus tachycardia with PACs. He was initially started on IV Cardizem but then  became significantly hypotensive with this. - Telemetry shows 2 brief runs of what looks like SVT/ atrial tachycardia with heart rates in the 160s. Will review with MD and make sure this is not atrial fibrillation. - No AV nodal agents due to hypotension.  Hypotension History of Hypertension History of hypertension but has been hypotensive in setting of septic shock here requiring pressors.  - Pressors have been weaned off. Levophed was stopped this morning. - Home Losartan and Toprol-XL on hold. - Continue to monitor closely off pressors.  Otherwise, per primary team - Acute hypoxic respiratory failure secondary to COPD exacerbation and CAP - Septic shock - Type 2 diabetes mellitus   For questions or updates, please contact [place] Please consult [email or web address] for contact info under        Signed, Darreld Mclean, PA-C  11/30/2021, 7:10 AM    Patient seen and examined with CG PA-C.  Agree as above, with the following exceptions and changes as noted below.  Remains intubated and sedated with propofol.  Propofol contributing to hypotension, Levophed intermittently required.  Concern for QT prolongation, however alternative sedation options are not felt to be viable for sedation at this time.  QTc 520 milliseconds while I was in the room.  All available labs, radiology testing, previous records reviewed. Recommend aspirin and statin per tube if able. EKG remains abnormal however respiratory failure likely contributor to type II nstemi. Medical management of NSTEMI advised at this time. GDMT limited in setting of pressor requirement.     Elouise Munroe, MD 11/30/21 12:44 PM

## 2021-12-01 DIAGNOSIS — J9601 Acute respiratory failure with hypoxia: Secondary | ICD-10-CM | POA: Diagnosis not present

## 2021-12-01 DIAGNOSIS — J9621 Acute and chronic respiratory failure with hypoxia: Secondary | ICD-10-CM | POA: Diagnosis not present

## 2021-12-01 DIAGNOSIS — J9622 Acute and chronic respiratory failure with hypercapnia: Secondary | ICD-10-CM | POA: Diagnosis not present

## 2021-12-01 DIAGNOSIS — J9602 Acute respiratory failure with hypercapnia: Secondary | ICD-10-CM | POA: Diagnosis not present

## 2021-12-01 LAB — GLUCOSE, CAPILLARY
Glucose-Capillary: 120 mg/dL — ABNORMAL HIGH (ref 70–99)
Glucose-Capillary: 129 mg/dL — ABNORMAL HIGH (ref 70–99)
Glucose-Capillary: 149 mg/dL — ABNORMAL HIGH (ref 70–99)
Glucose-Capillary: 151 mg/dL — ABNORMAL HIGH (ref 70–99)
Glucose-Capillary: 166 mg/dL — ABNORMAL HIGH (ref 70–99)
Glucose-Capillary: 167 mg/dL — ABNORMAL HIGH (ref 70–99)

## 2021-12-01 LAB — CBC
HCT: 33.4 % — ABNORMAL LOW (ref 39.0–52.0)
Hemoglobin: 10.1 g/dL — ABNORMAL LOW (ref 13.0–17.0)
MCH: 29.4 pg (ref 26.0–34.0)
MCHC: 30.2 g/dL (ref 30.0–36.0)
MCV: 97.4 fL (ref 80.0–100.0)
Platelets: 256 10*3/uL (ref 150–400)
RBC: 3.43 MIL/uL — ABNORMAL LOW (ref 4.22–5.81)
RDW: 14.9 % (ref 11.5–15.5)
WBC: 12.9 10*3/uL — ABNORMAL HIGH (ref 4.0–10.5)
nRBC: 0.2 % (ref 0.0–0.2)

## 2021-12-01 LAB — BASIC METABOLIC PANEL
Anion gap: 8 (ref 5–15)
BUN: 37 mg/dL — ABNORMAL HIGH (ref 6–20)
CO2: 29 mmol/L (ref 22–32)
Calcium: 8.2 mg/dL — ABNORMAL LOW (ref 8.9–10.3)
Chloride: 108 mmol/L (ref 98–111)
Creatinine, Ser: 0.74 mg/dL (ref 0.61–1.24)
GFR, Estimated: >60 mL/min (ref >60)
Glucose, Bld: 183 mg/dL — ABNORMAL HIGH (ref 70–99)
Potassium: 4.8 mmol/L (ref 3.5–5.1)
Sodium: 145 mmol/L (ref 135–145)

## 2021-12-01 LAB — MAGNESIUM: Magnesium: 2.8 mg/dL — ABNORMAL HIGH (ref 1.7–2.4)

## 2021-12-01 LAB — PROCALCITONIN: Procalcitonin: 3.62 ng/mL

## 2021-12-01 LAB — PHOSPHORUS: Phosphorus: 2.6 mg/dL (ref 2.5–4.6)

## 2021-12-01 MED ORDER — ENOXAPARIN SODIUM 40 MG/0.4ML IJ SOSY
40.0000 mg | PREFILLED_SYRINGE | INTRAMUSCULAR | Status: DC
  Administered 2021-12-01 – 2021-12-08 (×7): 40 mg via SUBCUTANEOUS
  Filled 2021-12-01 (×7): qty 0.4

## 2021-12-01 MED ORDER — CLEVIDIPINE BUTYRATE 0.5 MG/ML IV EMUL
0.0000 mg/h | INTRAVENOUS | Status: DC
  Administered 2021-12-01: 12 mg/h via INTRAVENOUS
  Administered 2021-12-01: 8 mg/h via INTRAVENOUS
  Administered 2021-12-01: 1 mg/h via INTRAVENOUS
  Administered 2021-12-01 (×3): 10 mg/h via INTRAVENOUS
  Administered 2021-12-02: 7 mg/h via INTRAVENOUS
  Administered 2021-12-02: 12 mg/h via INTRAVENOUS
  Filled 2021-12-01: qty 50
  Filled 2021-12-01 (×3): qty 100
  Filled 2021-12-01 (×3): qty 50
  Filled 2021-12-01: qty 100
  Filled 2021-12-01 (×2): qty 50
  Filled 2021-12-01 (×3): qty 100
  Filled 2021-12-01: qty 50
  Filled 2021-12-01: qty 100

## 2021-12-01 MED ORDER — DOCUSATE SODIUM 50 MG/5ML PO LIQD
100.0000 mg | Freq: Two times a day (BID) | ORAL | Status: DC
  Administered 2021-12-01: 100 mg via ORAL
  Filled 2021-12-01: qty 10

## 2021-12-01 MED ORDER — DEXMEDETOMIDINE HCL IN NACL 200 MCG/50ML IV SOLN
0.0000 ug/kg/h | INTRAVENOUS | Status: DC
  Administered 2021-12-01: 1 ug/kg/h via INTRAVENOUS
  Administered 2021-12-01: 0.5 ug/kg/h via INTRAVENOUS
  Administered 2021-12-01 (×2): 0.4 ug/kg/h via INTRAVENOUS
  Administered 2021-12-02 (×3): 0.6 ug/kg/h via INTRAVENOUS
  Filled 2021-12-01 (×8): qty 50

## 2021-12-01 NOTE — Progress Notes (Signed)
Arterial line has good waveform, draws and flushes well at this time. Line is labeled.

## 2021-12-01 NOTE — Consult Note (Signed)
   [facility] Inpatient Consult   12/01/2021  SHADRACH BARTUNEK 11/04/1960 [id number]  Managed Medicaid: [facility]iaison for patient admitted to  [facility]  Patient is active with the Managed Medicaid team admitted to ICU on 11/28/21 was intubated.  Plan: Will follow up with patient's [facility] Managed Medicaid RNCM to alert of admission to hospital. Continue to follow for progress and post hospital needs.  For questions, and note that [facility] does not replace or interfere with services arranged by the inpatient [facility] team for transitional needs.  Natividad Brood, RN BSN [place]  [phone] business mobile phone Toll free office [phone]  *[place]  [phone] Fax number: [phone] [email or web address]

## 2021-12-01 NOTE — Procedures (Signed)
Extubation Procedure Note  Patient Details:   Name: James [name] DOB: 05-28-1960 MRN: [id number]   Airway Documentation:    Vent end date: 12/01/21 Vent end time: 1130   Evaluation  O2 sats: stable throughout Complications: No apparent complications Patient did tolerate procedure well. Bilateral Breath Sounds: Expiratory wheezes, Diminished   Yes  Baldwin [place] Nannette 12/01/2021, 11:37 AM  *Positive cuff leak pre extubation. *Placed on 4 LPM nasal cannula post extubation.

## 2021-12-01 NOTE — Progress Notes (Signed)
Pharmacy Antibiotic Note  James [name] is a 61 y.o. male admitted on 11/28/2021 with pneumonia.  Pharmacy has been consulted for unasyn dosing.  12/01/2021 Day #4 Unasyn WBC trending down, 12.9 today SCr remains WNL Afebrile  Plan: Continue Unasyn 3g IV Q6H F/u renal fxn, C&S, clinical status   Height: 5\' 9"  (175.3 cm) Weight: 97.1 kg (214 lb 1.1 oz) IBW/kg (Calculated) : 70.7  Temp (24hrs), Avg:98.9 F (37.2 C), Min:98.4 F (36.9 C), Max:99.7 F (37.6 C)  Recent Labs  Lab 11/28/21 0500 11/28/21 0625 11/28/21 0904 11/29/21 0500 11/29/21 1630 11/30/21 0447 12/01/21 0529  WBC 24.6*  --   --  22.4*  --  17.3* 12.9*  CREATININE  --  1.13  --  0.62 0.89 0.63 0.74  LATICACIDVEN  --   --  1.7  --   --   --   --      Estimated Creatinine Clearance: 112.9 mL/min (by C-G formula based on SCr of 0.74 mg/dL).    No Known Allergies  Antimicrobials this admission: Unasyn 11/25>> Azithro/CTX x 1 11/25 Culture data:  11/25 BCx: ngtd 11/25 resp panel: rhinovirus/enterovirus detected 11/27 Trach asp: few GPC in pairs 11/25 legionella/strep pneumo neg  Thank you for allowing pharmacy to be a part of this patient's care.  Eudelia Bunch, Pharm.D Use secure chat for questions 12/01/2021 8:32 AM

## 2021-12-01 NOTE — Progress Notes (Signed)
Rounding Note    Patient Name: James [name] Date of Encounter: 12/01/2021  [place] Cardiologist: Evalina Field, MD   Subjective   No acute overnight events. Still intubated and sedated.  Inpatient Medications    Scheduled Meds:  arformoterol  15 mcg Nebulization BID   budesonide (PULMICORT) nebulizer solution  0.25 mg Nebulization BID   Chlorhexidine Gluconate Cloth  6 each Topical Daily   docusate  100 mg Per Tube BID   enoxaparin (LOVENOX) injection  40 mg Subcutaneous Q24H   feeding supplement (PROSource TF20)  60 mL Per Tube Daily   folic acid  1 mg Per Tube Daily   insulin aspart  0-20 Units Subcutaneous Q4H   ipratropium-albuterol  3 mL Nebulization Q6H   methylPREDNISolone (SOLU-MEDROL) injection  40 mg Intravenous Q12H   multivitamin with minerals  1 tablet Per Tube Daily   mouth rinse  15 mL Mouth Rinse Q2H   pantoprazole (PROTONIX) IV  40 mg Intravenous Daily   polyethylene glycol  17 g Per Tube Daily   revefenacin  175 mcg Nebulization Daily   thiamine  100 mg Per Tube Daily   Continuous Infusions:  sodium chloride     sodium chloride 20 mL/hr at 12/01/21 1000   sodium chloride Stopped (11/29/21 0648)   ampicillin-sulbactam (UNASYN) IV Stopped (12/01/21 9675)   dexmedetomidine (PRECEDEX) IV infusion 0.4 mcg/kg/hr (12/01/21 1000)   feeding supplement (VITAL 1.5 CAL) 60 mL/hr at 12/01/21 1000   fentaNYL infusion INTRAVENOUS 200 mcg/hr (12/01/21 1000)   propofol (DIPRIVAN) infusion 35 mcg/kg/min (12/01/21 1000)   PRN Meds: sodium chloride, acetaminophen (TYLENOL) oral liquid 160 mg/5 mL, albuterol, fentaNYL, midazolam, ondansetron (ZOFRAN) IV, mouth rinse   Vital Signs    Vitals:   12/01/21 0805 12/01/21 0810 12/01/21 0900 12/01/21 1000  BP:    138/78  Pulse:   92 83  Resp:   13 10  Temp:   99.1 F (37.3 C) 99.1 F (37.3 C)  TempSrc:      SpO2: 95% 94% 91% 90%  Weight:      Height:  5\' 9"  (1.753 m)      Intake/Output Summary  (Last 24 hours) at 12/01/2021 1019 Last data filed at 12/01/2021 1000 Gross per 24 hour  Intake 3071.16 ml  Output 1420 ml  Net 1651.16 ml      12/01/2021    5:00 AM 11/29/2021    5:00 AM 11/28/2021    4:15 PM  Last 3 Weights  Weight (lbs) 214 lb 1.1 oz 205 lb 7.5 oz 202 lb 6.1 oz  Weight (kg) 97.1 kg 93.2 kg 91.8 kg      Telemetry    Normal sinus rhythm with rate mostly in the 60s to 80s. One brief episodes of SVT/ atrial tachycardia with rates in the 160s noted. - Personally Reviewed  ECG    11/27 - SR, deep anterolateral T wave inversions, prolonged QTc. - Personally Reviewed  Physical Exam   GEN: Intubated and sedated. Neck: No JVD. Cardiac: RRR. No murmurs. Respiratory: Intubated. Clear to auscultation bilaterally. No wheezes, rhonchi, or rales noted. GI: Soft, non distended, and non-tender. MS: Trace lower extremity edema bilaterally. No deformity. Skin: Warm and dry. Neuro:  Intubated and sedated. Psych: Intubated and sedated.  Labs    High Sensitivity Troponin:   Recent Labs  Lab 11/28/21 0500 11/28/21 0739 11/28/21 1806 11/28/21 2013 11/29/21 0807  TROPONINIHS 20* 467* 1,676* 1,951* 1,296*     Chemistry Recent Labs  Lab 11/29/21 0500 11/29/21 1630 11/30/21 0447 12/01/21 0529  NA 142 141 141 145  K 4.8 4.6 4.5 4.8  CL 107 106 107 108  CO2 27 28 27 29   GLUCOSE 149* 167* 171* 183*  BUN 21* 26* 31* 37*  CREATININE 0.62 0.89 0.63 0.74  CALCIUM 8.3* 8.3* 8.1* 8.2*  MG 2.3 2.2  --  2.8*  GFRNONAA >60 >60 >60 >60  ANIONGAP 8 7 7 8     Lipids  Recent Labs  Lab 11/29/21 0500  TRIG 85    Hematology Recent Labs  Lab 11/29/21 0500 11/30/21 0447 12/01/21 0529  WBC 22.4* 17.3* 12.9*  RBC 3.68* 3.49* 3.43*  HGB 10.8* 10.3* 10.1*  HCT 35.2* 33.9* 33.4*  MCV 95.7 97.1 97.4  MCH 29.3 29.5 29.4  MCHC 30.7 30.4 30.2  RDW 14.5 14.8 14.9  PLT 259 255 256   Thyroid No results for input(s): "TSH", "FREET4" in the last 168 hours.  BNP Recent  Labs  Lab 11/28/21 0500  BNP 50.2    DDimer No results for input(s): "DDIMER" in the last 168 hours.   Radiology    DG CHEST PORT 1 VIEW  Result Date: 11/30/2021 CLINICAL DATA:  Respiratory failure EXAM: PORTABLE CHEST 1 VIEW COMPARISON:  11/29/2021 FINDINGS: Pulmonary vascular congestion. Alveolar density right lower hemithorax. Right-sided pleural effusion. No pneumothorax identified. Aorta is calcified. Endotracheal tube tip at the thoracic inlet. NG tube tip below the diaphragm and off x-ray. IMPRESSION: Vascular congestion.  Right-sided consolidation and effusion. Electronically Signed   By: Sammie Bench M.D.   On: 11/30/2021 09:02    Cardiac Studies   Echocardiogram 11/29/2021: Impressions:  1. Left ventricular ejection fraction, by estimation, is 50 to 55%. The  left ventricle has low normal function. The left ventricle has no regional  wall motion abnormalities. Left ventricular diastolic parameters are  consistent with Grade I diastolic  dysfunction (impaired relaxation).   2. Right ventricular systolic function is normal. The right ventricular  size is normal. Tricuspid regurgitation signal is inadequate for assessing  PA pressure.   3. The mitral valve is normal in structure. No evidence of mitral valve  regurgitation.   4. The aortic valve is grossly normal. Aortic valve regurgitation is not  visualized. No aortic stenosis is present.   5. The inferior vena cava is normal in size with greater than 50%  respiratory variability, suggesting right atrial pressure of 3 mmHg.   Comparison(s): Prior images reviewed side by side. The left ventricular  systolic function is worsened. but at the lower limit of normal.    Patient Profile     61 y.o. male with a history of severe COPD on home O2, hypertension, hyperlipidemia, type 2 diabetes mellitus, and alcohol abuse who was admitted on 11/28/2021 with acute hypoxic respiratory failure secondary to COPD exacerbation and  community acquired pneumonia requiring intubation. Cardiology was consulted for further evaluation of elevated troponin.  Assessment & Plan    Type II NSTEMI Coronary Artery Calcifications Patient was admitted with acute on chronic hypoxic respiratory failure secondary to COPD exacerbation and CAP requiring intubation and was found to have an elevated troponin. High-sensitivity troponin peaked at 1,951 and has trended down. Initial EKG showed sinus tachycardia with non-specific T wave changes. However, repeat EKG on 11/26 shows normal sinus rhythm with deep T wave inversions in inferior leads and pre-cardial leads which are new. Echo shows LVEF of 50-55% with no regional wall motion abnormalities and grade 1 diastolic dysfunction. He  has no documented obstructive CAD but coronary and aortic calcifications have been noted on prior CT scans. - intubated and sedated. - repeat EKG abnormal but stable. - Completed on treatment dose of Lovenox for 48 hr. Now on ppx dose. - Patient is currently still intubated. Would plan to start aspirin and statin per tube when able. - Felt to likely be type 2 NSTEMI (demand ischemia) secondary to underlying illness. Cardiac catheterization not felt to be necessary at this time. However, would ultimately benefit from an ischemic evaluation when he recovers from acute illness and is more stable (this can likely be done as an outpatient). - BP stable off of pressors. Consider reinitiating home GDMT when tolerated.   Probable SVT There was initially concerns that patient was in rapid atrial fibrillation on admission. However, upon review of EKGs looks more like sinus tachycardia with PACs. He was initially started on IV Cardizem but then became significantly hypotensive with this. - Telemetry shows brief runs of what looks like SVT/ atrial tachycardia with heart rates in the 160s. Does not appear to be atrial fibrillation. - No AV nodal agents at the moment are  required.  Hypotension History of Hypertension History of hypertension but has been hypotensive in setting of septic shock here requiring pressors.  - Pressors have been weaned off. BP stable.  - Home Losartan and Toprol-XL on hold, consider resuming step wise, cautiously in next 1-2 days.   Otherwise, per primary team - Acute hypoxic respiratory failure secondary to COPD exacerbation and CAP - Septic shock - Type 2 diabetes mellitus   For questions or updates, please contact [place] Please consult [email or web address] for contact info under        Signed, Elouise Munroe, MD  12/01/2021, 10:19 AM

## 2021-12-01 NOTE — Progress Notes (Addendum)
PT Cancellation Note  Patient Details Name: James [name] MRN: [id number] DOB: Jan 02, 1961   Cancelled Treatment:    Reason Eval/Treat Not Completed: Medical issues which prohibited therapy;Patient not medically ready. Secure chat with RN Herminio Commons RN, pt not appropriate for PT at this time.    [facility]  Office: [phone]

## 2021-12-01 NOTE — Progress Notes (Addendum)
NAME:  James [name], MRN:  [id number], DOB:  06/04/1960, LOS: 3 ADMISSION DATE:  11/28/2021, CONSULTATION DATE:  73/41/[id number] REFERRING MD: Roxanne Mins - EDP, CHIEF COMPLAINT:  Short of breath   History of Present Illness:  61 year old man who presented to the [facility] ED 11/25 with dyspnea.  SpO2 on room air 82%.  Started on therapy for COPD exacerbation.  Found to have AFib with HR in 160s, and BP 217/135 in ER.  Trialed on BiPAP but continued to have respiratory distress. Intubated in ER. Transferred to [facility]. PMHx significant for HTN, COPD with emphysema, lung nodules, tobacco use, prior PTX, T2DM, post-herpetic neuralgia, EtOH use.  Pertinent Medical History:  COPD with emphysema, DM type 2, HTN, Lung nodules, PNA, COVID, Pneumothorax, Post-herpetic neuralgia, ETOH  Significant Hospital Events: Including procedures, antibiotic start and stop dates in addition to other pertinent events   11/25 Admit, intubated, started ABx, start cardizem gtt-->got hypotensive. Converted to SR but started on Neo.  11/26 Overnight transferred from [facility] ED to [facility] ICU 11/27 Remains intubated on Prop/Versed for sedation; increased secretions with high peak pressures on vent, Resp Cx sent. 11/28 Resp Cx with few gram + cocci in pairs. FiO2 needs down to 50%. Secretions improved, but ongoing breath stacking. Sedation to Precedex. Tolerating PSV.  Interim History / Subjective:  No significant events overnight Continued breath stacking with high peak pressures 30s-40s Sedation to Precedex, trying PSV Unasyn for ?PNA, Resp Cx with gram + cocci in pairs Wean as able to extubate, 11/28 vs. 11/29  Objective:  Blood pressure 134/65, pulse 78, temperature 99 F (37.2 C), resp. rate (!) 22, height 5\' 9"  (1.753 m), weight 97.1 kg, SpO2 94 %.    Vent Mode: PRVC FiO2 (%):  [50 %-60 %] 60 % Set Rate:  [22 bmp] 22 bmp Vt Set:  [540 mL] 540 mL PEEP:  [5 cmH20-9 cmH20] 5 cmH20 Plateau Pressure:  [16 cmH20-30 cmH20] 16 cmH20    Intake/Output Summary (Last 24 hours) at 12/01/2021 0741 Last data filed at 12/01/2021 0600 Gross per 24 hour  Intake 2695.15 ml  Output 1375 ml  Net 1320.15 ml    Filed Weights   11/28/21 1615 11/29/21 0500 12/01/21 0500  Weight: 91.8 kg 93.2 kg 97.1 kg   Physical Examination: General: Acutely ill-appearing middle-aged man in NAD. HEENT: [place]/AT, anicteric sclera, PERRL 27mm, L subconjunctival hemorrhage, moist mucous membranes. ETT/OGT in place. Neuro:  Sedated, but opens eyes to voice and nods appropriately to questions.  Responds to verbal stimuli. Following commands consistently. Generalized weakness. +Cough and +Gag  CV: RRR, no m/g/r, heart sounds distant. PULM: Breathing even and minimally labored on vent (PEEP 5, FiO2 50%, breath stacking). Lung fields diminished throughout, R > L. GI: Soft, nontender, mildly distended. Normoactive bowel sounds. Extremities: No significant LE edema noted. Skin: Warm/dry, no rashes.  Resolved Hospital Problem List:  HTN emergency with acute pulmonary edema  Assessment & Plan:   Acute on chronic hypoxemic and hypercarbic respiratory failure 2/2 Rhinovirus with superimposed bacteria pneumonia COPD exacerbation - Continue full vent support (4-8cc/kg IBW) - Wean FiO2 for O2 sat 88-92% - Daily WUA/SBT, weaning sedation as tolerated; goal WTE today, 11/28 - VAP bundle - Bronchodilators (Brovana/Yupelri, Pulmicort, DuoNeb) - Continue Solumedrol - Pulmonary hygiene - PAD protocol for sedation: Precedex and Fentanyl for goal RASS 0 to -1 - Continue Unasyn - Follow Resp Cx, narrow abx as able - Follow CXR  Circulatory Shock favor mix of sepsis/septic shock and medication related hypotension  from sedation and CCB Shock - Goal MAP > 65 - Fluid resuscitation as tolerated - Remains off of pressors - Trend WBC, fever curve - F/u Cx data - Continue broad-spectrum antibiotics  A fib with RVR-->currently ST NSTEMI Initially looked like HTN  emergency but developed fairly sig hypotension after CCB infusion and Lasix. Echo with LVEF 50-55%, no RWMAs, G1DD. - Cardiology consulted - Treatment-dose Lovenox - Hold diltiazem, resume as BP tolerates - May require amio if unable to tolerate dilt - Optimize electrolytes (K > 4, Mg > 2)  DM type 2 poorly controlled with steroid induced hyperglycemia. - SSI - CBGs Q4H - Goal CBG 140-180  Best Practice: (right click and "Reselect all SmartList Selections" daily)   Diet/type: tubefeeds DVT prophylaxis: prophylactic heparin  GI prophylaxis: PPI Lines: N/A Foley:  N/A Code Status:  full code Last date of multidisciplinary goals of care discussion [x]   Critical care time: 31 minutes   Lestine Mount, PA-C [facility] 12/01/21 7:41 AM  Please see [email or web address] for pager details.  From 7A-7P if no response, please call [phone] After hours, please call ELink [phone]

## 2021-12-02 DIAGNOSIS — J159 Unspecified bacterial pneumonia: Secondary | ICD-10-CM | POA: Diagnosis not present

## 2021-12-02 DIAGNOSIS — E440 Moderate protein-calorie malnutrition: Secondary | ICD-10-CM | POA: Diagnosis not present

## 2021-12-02 DIAGNOSIS — Z20822 Contact with and (suspected) exposure to covid-19: Secondary | ICD-10-CM | POA: Diagnosis not present

## 2021-12-02 DIAGNOSIS — J440 Chronic obstructive pulmonary disease with acute lower respiratory infection: Secondary | ICD-10-CM | POA: Diagnosis not present

## 2021-12-02 DIAGNOSIS — J9602 Acute respiratory failure with hypercapnia: Secondary | ICD-10-CM | POA: Diagnosis not present

## 2021-12-02 DIAGNOSIS — Z8616 Personal history of COVID-19: Secondary | ICD-10-CM | POA: Diagnosis not present

## 2021-12-02 DIAGNOSIS — J9621 Acute and chronic respiratory failure with hypoxia: Secondary | ICD-10-CM | POA: Diagnosis not present

## 2021-12-02 DIAGNOSIS — I4719 Other supraventricular tachycardia: Secondary | ICD-10-CM | POA: Diagnosis not present

## 2021-12-02 DIAGNOSIS — A419 Sepsis, unspecified organism: Secondary | ICD-10-CM | POA: Diagnosis not present

## 2021-12-02 DIAGNOSIS — I5031 Acute diastolic (congestive) heart failure: Secondary | ICD-10-CM | POA: Diagnosis not present

## 2021-12-02 DIAGNOSIS — J9622 Acute and chronic respiratory failure with hypercapnia: Secondary | ICD-10-CM | POA: Diagnosis not present

## 2021-12-02 DIAGNOSIS — I21A1 Myocardial infarction type 2: Secondary | ICD-10-CM | POA: Diagnosis not present

## 2021-12-02 DIAGNOSIS — J9601 Acute respiratory failure with hypoxia: Secondary | ICD-10-CM | POA: Diagnosis not present

## 2021-12-02 DIAGNOSIS — R6521 Severe sepsis with septic shock: Secondary | ICD-10-CM | POA: Diagnosis not present

## 2021-12-02 LAB — GLUCOSE, CAPILLARY
Glucose-Capillary: 102 mg/dL — ABNORMAL HIGH (ref 70–99)
Glucose-Capillary: 124 mg/dL — ABNORMAL HIGH (ref 70–99)
Glucose-Capillary: 146 mg/dL — ABNORMAL HIGH (ref 70–99)
Glucose-Capillary: 148 mg/dL — ABNORMAL HIGH (ref 70–99)
Glucose-Capillary: 85 mg/dL (ref 70–99)
Glucose-Capillary: 92 mg/dL (ref 70–99)

## 2021-12-02 LAB — CBC
HCT: 36.8 % — ABNORMAL LOW (ref 39.0–52.0)
Hemoglobin: 11.6 g/dL — ABNORMAL LOW (ref 13.0–17.0)
MCH: 29.4 pg (ref 26.0–34.0)
MCHC: 31.5 g/dL (ref 30.0–36.0)
MCV: 93.2 fL (ref 80.0–100.0)
Platelets: 317 10*3/uL (ref 150–400)
RBC: 3.95 MIL/uL — ABNORMAL LOW (ref 4.22–5.81)
RDW: 14.6 % (ref 11.5–15.5)
WBC: 17.7 10*3/uL — ABNORMAL HIGH (ref 4.0–10.5)
nRBC: 0.1 % (ref 0.0–0.2)

## 2021-12-02 LAB — CULTURE, RESPIRATORY W GRAM STAIN: Culture: NORMAL

## 2021-12-02 LAB — BASIC METABOLIC PANEL
Anion gap: 8 (ref 5–15)
BUN: 39 mg/dL — ABNORMAL HIGH (ref 6–20)
CO2: 30 mmol/L (ref 22–32)
Calcium: 9.1 mg/dL (ref 8.9–10.3)
Chloride: 104 mmol/L (ref 98–111)
Creatinine, Ser: 0.63 mg/dL (ref 0.61–1.24)
GFR, Estimated: >60 mL/min (ref >60)
Glucose, Bld: 130 mg/dL — ABNORMAL HIGH (ref 70–99)
Potassium: 4.4 mmol/L (ref 3.5–5.1)
Sodium: 142 mmol/L (ref 135–145)

## 2021-12-02 LAB — PHOSPHORUS: Phosphorus: 3.2 mg/dL (ref 2.5–4.6)

## 2021-12-02 LAB — MAGNESIUM: Magnesium: 2.6 mg/dL — ABNORMAL HIGH (ref 1.7–2.4)

## 2021-12-02 MED ORDER — METHYLPREDNISOLONE SODIUM SUCC 40 MG IJ SOLR
40.0000 mg | INTRAMUSCULAR | Status: DC
  Administered 2021-12-03 – 2021-12-05 (×3): 40 mg via INTRAVENOUS
  Filled 2021-12-02 (×3): qty 1

## 2021-12-02 MED ORDER — ORAL CARE MOUTH RINSE: 15.0000 mL | OROMUCOSAL | Status: DC | PRN

## 2021-12-02 MED ORDER — ASPIRIN 81 MG PO CHEW
81.0000 mg | CHEWABLE_TABLET | Freq: Every day | ORAL | Status: DC
  Administered 2021-12-03 – 2021-12-08 (×5): 81 mg via ORAL
  Filled 2021-12-02 (×6): qty 1

## 2021-12-02 MED ORDER — FUROSEMIDE 10 MG/ML IJ SOLN
40.0000 mg | Freq: Every day | INTRAMUSCULAR | Status: DC
  Administered 2021-12-02 – 2021-12-06 (×5): 40 mg via INTRAVENOUS
  Filled 2021-12-02 (×5): qty 4

## 2021-12-02 MED ORDER — ADULT MULTIVITAMIN W/MINERALS CH
1.0000 | ORAL_TABLET | Freq: Every day | ORAL | Status: DC
  Administered 2021-12-02 – 2021-12-08 (×7): 1 via ORAL
  Filled 2021-12-02 (×7): qty 1

## 2021-12-02 MED ORDER — THIAMINE MONONITRATE 100 MG PO TABS
100.0000 mg | ORAL_TABLET | Freq: Every day | ORAL | Status: DC
  Administered 2021-12-02 – 2021-12-08 (×7): 100 mg via ORAL
  Filled 2021-12-02 (×7): qty 1

## 2021-12-02 MED ORDER — PANTOPRAZOLE SODIUM 40 MG IV SOLR
40.0000 mg | INTRAVENOUS | Status: DC
  Administered 2021-12-02: 40 mg via INTRAVENOUS

## 2021-12-02 MED ORDER — ORAL CARE MOUTH RINSE
15.0000 mL | OROMUCOSAL | Status: DC
  Administered 2021-12-02: 15 mL via OROMUCOSAL

## 2021-12-02 MED ORDER — BUDESONIDE 0.5 MG/2ML IN SUSP
0.5000 mg | Freq: Two times a day (BID) | RESPIRATORY_TRACT | Status: DC
  Administered 2021-12-02 – 2021-12-08 (×11): 0.5 mg via RESPIRATORY_TRACT
  Filled 2021-12-02 (×12): qty 2

## 2021-12-02 MED ORDER — FOLIC ACID 1 MG PO TABS
1.0000 mg | ORAL_TABLET | Freq: Every day | ORAL | Status: DC
  Administered 2021-12-02 – 2021-12-08 (×7): 1 mg via ORAL
  Filled 2021-12-02 (×7): qty 1

## 2021-12-02 MED ORDER — PREGABALIN 100 MG PO CAPS
100.0000 mg | ORAL_CAPSULE | Freq: Two times a day (BID) | ORAL | Status: DC
  Administered 2021-12-02 – 2021-12-08 (×12): 100 mg via ORAL
  Filled 2021-12-02 (×12): qty 1

## 2021-12-02 MED ORDER — ATORVASTATIN CALCIUM 20 MG PO TABS
80.0000 mg | ORAL_TABLET | Freq: Every day | ORAL | Status: DC
  Administered 2021-12-02 – 2021-12-07 (×6): 80 mg via ORAL
  Filled 2021-12-02 (×3): qty 2
  Filled 2021-12-02: qty 4
  Filled 2021-12-02 (×2): qty 2

## 2021-12-02 MED ORDER — PANTOPRAZOLE SODIUM 40 MG PO TBEC
40.0000 mg | DELAYED_RELEASE_TABLET | Freq: Every day | ORAL | Status: DC
  Administered 2021-12-03 – 2021-12-08 (×6): 40 mg via ORAL
  Filled 2021-12-02 (×6): qty 1

## 2021-12-02 MED ORDER — METOPROLOL SUCCINATE ER 25 MG PO TB24
25.0000 mg | ORAL_TABLET | Freq: Every day | ORAL | Status: DC
  Administered 2021-12-02 – 2021-12-03 (×2): 25 mg via ORAL
  Filled 2021-12-02 (×2): qty 1

## 2021-12-02 MED ORDER — ASPIRIN 81 MG PO CHEW
81.0000 mg | CHEWABLE_TABLET | Freq: Once | ORAL | Status: AC
  Administered 2021-12-02: 81 mg via ORAL
  Filled 2021-12-02: qty 1

## 2021-12-02 MED ORDER — LOSARTAN POTASSIUM 50 MG PO TABS
50.0000 mg | ORAL_TABLET | Freq: Every day | ORAL | Status: DC
  Administered 2021-12-02 – 2021-12-08 (×7): 50 mg via ORAL
  Filled 2021-12-02 (×7): qty 1

## 2021-12-02 MED ORDER — ACETAMINOPHEN 160 MG/5ML PO SOLN
650.0000 mg | Freq: Four times a day (QID) | ORAL | Status: DC | PRN
  Administered 2021-12-02 – 2021-12-03 (×2): 650 mg via ORAL
  Filled 2021-12-02 (×2): qty 20.3

## 2021-12-02 NOTE — Evaluation (Signed)
Physical Therapy Evaluation Patient Details Name: James [name] MRN: [id number] DOB: Nov 17, 1960 Today's Date: 12/02/2021  History of Present Illness  61 y.o. male  admitted on 11/28/2021 with acute respiratory failure secondary to COPD exacerbation and community acquired PNA requiring intubation. extubated 12/01/21. Cardiology consulted for elevated troponin. PMH: COPD on home O2, HTN, HLD, DM II and ETOH abuse  Clinical Impression  Pt admitted with above diagnosis.  Pt anxious regarding mobility, dyspneic with minimal activity,  fully cooperative with PT;  A-line site bleeding< RN made aware; VS as follows-- BP fluctuating with mobility (expected per RN, pt is on BP meds)--BP 180/74, 202/97 briefly with sitting EOB; HR 80s, SpO2=96%--100% on 5L, RR 20s   Pt currently with functional limitations due to the deficits listed below (see PT Problem List). Pt will benefit from skilled PT to increase their independence and safety with mobility to allow discharge to the venue listed below.          Recommendations for follow up therapy are one component of a multi-disciplinary discharge planning process, led by the attending physician.  Recommendations may be updated based on patient status, additional functional criteria and insurance authorization.  Follow Up Recommendations Skilled nursing-short term rehab (<3 hours/day) Can patient physically be transported by private vehicle: No    Assistance Recommended at Discharge Frequent or constant Supervision/Assistance  Patient can return home with the following  Help with stairs or ramp for entrance;Assist for transportation;Assistance with cooking/housework;A lot of help with bathing/dressing/bathroom;Two people to help with bathing/dressing/bathroom;Two people to help with walking and/or transfers    Equipment Recommendations Other (comment) (TBD)  Recommendations for Other Services       Functional Status Assessment Patient has had a  recent decline in their functional status and demonstrates the ability to make significant improvements in function in a reasonable and predictable amount of time.     Precautions / Restrictions Precautions Precautions: Fall Restrictions Weight Bearing Restrictions: No      Mobility  Bed Mobility Overal bed mobility: Needs Assistance Bed Mobility: Supine to Sit, Sit to Supine     Supine to sit: Mod assist Sit to supine: Mod assist   General bed mobility comments: assist with LEs and trunk in both directions;    Transfers Overall transfer level: Needs assistance Equipment used: 2 person hand held assist Transfers: Sit to/from Stand Sit to Stand: Min assist, +2 physical assistance, +2 safety/equipment           General transfer comment: assist to rise and stabilize; +2 for lines and safety    Ambulation/Gait                  Stairs            Wheelchair Mobility    Modified Rankin (Stroke Patients Only)       Balance Overall balance assessment: Needs assistance Sitting-balance support: Feet supported, Bilateral upper extremity supported, No upper extremity supported Sitting balance-Leahy Scale: Fair     Standing balance support: Bilateral upper extremity supported Standing balance-Leahy Scale: Poor Standing balance comment: bil HHA and external assist to maintain static stand                             Pertinent Vitals/Pain Pain Assessment Pain Assessment: Faces Faces Pain Scale: Hurts a little bit Pain Location: back-discomfort with mobility Pain Descriptors / Indicators: Grimacing Pain Intervention(s): Limited activity within patient's tolerance, Monitored during session, Premedicated before  session, Repositioned    Home Living Family/patient expects to be discharged to:: Private residence Living Arrangements: Non-relatives/Friends Available Help at Discharge: Available 24 hours/day Type of Home: House Home Access: Stairs  to enter Entrance Stairs-Rails: None     Home Layout: One level Home Equipment: None      Prior Function Prior Level of Function : Independent/Modified Independent             Mobility Comments: no longer drives but goes out with help to get groceries; reports his roommate will do Herbal[employer] Dominance        Extremity/Trunk Assessment   Upper Extremity Assessment Upper Extremity Assessment: Defer to OT evaluation    Lower Extremity Assessment Lower Extremity Assessment: Generalized weakness       Communication   Communication: No difficulties  Cognition Arousal/Alertness: Awake/alert Behavior During Therapy: WFL for tasks assessed/performed; anxious regarding mobility Overall Cognitive Status: Within Functional Limits for tasks assessed/ Ox2; unsure of exact date                                          General Comments      Exercises     Assessment/Plan    PT Assessment Patient needs continued PT services  PT Problem List Decreased strength;Decreased coordination;Decreased range of motion;Decreased activity tolerance;Decreased balance;Decreased mobility;Decreased knowledge of precautions;Decreased cognition       PT Treatment Interventions DME instruction;Therapeutic exercise;Gait training;Functional mobility training;Therapeutic activities;Patient/family education;Balance training    PT Goals (Current goals can be found in the Care Plan section)  Acute Rehab PT Goals Patient Stated Goal: get better PT Goal Formulation: With patient Time For Goal Achievement: 12/16/21 Potential to Achieve Goals: Good    Frequency Min 2X/week     Co-evaluation               AM-PAC PT "6 Clicks" Mobility  Outcome Measure Help needed turning from your back to your side while in a flat bed without using bedrails?: A Little Help needed moving from lying on your back to sitting on the side of a flat bed without using bedrails?: A  Little Help needed moving to and from a bed to a chair (including a wheelchair)?: A Lot Help needed standing up from a chair using your arms (e.g., wheelchair or bedside chair)?: A Lot Help needed to walk in hospital room?: A Lot Help needed climbing 3-5 steps with a railing? : Total 6 Click Score: 13    End of Session Equipment Utilized During Treatment: Gait belt Activity Tolerance: Patient tolerated treatment well Patient left: in bed;with call bell/phone within reach;with bed alarm set Nurse Communication: Mobility status;Other (comment) (A-line site bleeding) PT Visit Diagnosis: Difficulty in walking, not elsewhere classified (R26.2);Muscle weakness (generalized) (M62.81);Other abnormalities of gait and mobility (R26.89)    Time: 1435-1510 PT Time Calculation (min) (ACUTE ONLY): 35 min   Charges:   PT Evaluation $PT Eval Low Complexity: 1 Low PT Treatments $Therapeutic Activity: 8-22 mins        Baxter Flattery, PT  Acute Rehab Dept [facility]) [phone]  [facility] Weekend Pager [facility] only)  [phone]  12/02/2021   [facility] 12/02/2021, 3:36 PM

## 2021-12-02 NOTE — Progress Notes (Addendum)
NAME:  James [name], MRN:  [id number], DOB:  September 30, 1960, LOS: 4 ADMISSION DATE:  11/28/2021, CONSULTATION DATE:  18/29/9371 REFERRING MD: Roxanne Mins - EDP, CHIEF COMPLAINT:  Short of breath   History of Present Illness:  61 year old man who presented to the [facility] ED 11/25 with dyspnea.  SpO2 on room air 82%.  Started on therapy for COPD exacerbation.  Found to have AFib with HR in 160s, and BP 217/135 in ER.  Trialed on BiPAP but continued to have respiratory distress. Intubated in ER. Transferred to [facility]. PMHx significant for HTN, COPD with emphysema, lung nodules, tobacco use, prior PTX, T2DM, post-herpetic neuralgia, EtOH use.  Pertinent Medical History:  COPD with emphysema, DM type 2, HTN, Lung nodules, PNA, COVID, Pneumothorax, Post-herpetic neuralgia, ETOH  Significant Hospital Events: Including procedures, antibiotic start and stop dates in addition to other pertinent events   11/25 Admit, intubated, started ABx, start cardizem gtt-->got hypotensive. Converted to SR but started on Neo.  11/26 Overnight transferred from [facility] ED to [facility] ICU 11/27 Remains intubated on Prop/Versed for sedation; increased secretions with high peak pressures on vent, Resp Cx sent. 11/28 Resp Cx with few gram + cocci in pairs. FiO2 needs down to 50%. Secretions improved, but ongoing breath stacking. Sedation to Precedex. Tolerating PSV. Extubated. Hypertensive, started on cleviprex + precedex  Interim History / Subjective:  Remains on low dose precedex, pt reports he is anxious  On cleviprex for HTN  Tmax 99.9 I/O 3.2L UOP, -1.6 in last 24 hours  Objective:  Blood pressure 138/67, pulse 76, temperature 99.1 F (37.3 C), resp. rate (!) 21, height 5\' 9"  (1.753 m), weight 93.6 kg, SpO2 97 %.    Vent Mode: CPAP;PSV FiO2 (%):  [50 %] 50 % PEEP:  [5 cmH20] 5 cmH20 Pressure Support:  [5 cmH20] 5 cmH20   Intake/Output Summary (Last 24 hours) at 12/02/2021 0901 Last data filed at 12/02/2021 0600 Gross per 24 hour   Intake 1356.55 ml  Output 3075 ml  Net -1718.45 ml   Filed Weights   11/29/21 0500 12/01/21 0500 12/02/21 0350  Weight: 93.2 kg 97.1 kg 93.6 kg   Physical Examination: General: disheveled adult male sitting up in bed   HEENT: MM pink/moist, anicteric, [place] O2   Neuro: AAOx4, speech clear, MAE  CV: s1s2 RRR, no m/r/g PULM: pursed lip breathing, mild tachypnea but non-labored at rest, lungs bilaterally with wheezing, diminished GI: soft, bsx4 active  Extremities: warm/dry, trace edema  Skin: no rashes or lesions   Resolved Hospital Problem List:  HTN emergency with acute pulmonary edema Circulatory Shock favor mix of sepsis/septic shock and medication related hypotension from sedation and CCB Shock  Assessment & Plan:   Acute on Chronic Hypoxemic and Hypercarbic Respiratory Failure 2/2 Rhinovirus with superimposed Bacterial Pneumonia COPD Exacerbation -wean O2 for sats >88-94%. Baseline 2L at home.  -pulmonary hygiene -IS, mobilize -adjust pulmicort dosing  -continue brovana, yupelri  -duoneb PRN  -solumedrol  -continue unasyn, complete 5 days abx  -follow up respiratory culture > pending am 11/29  -intermittent CXR -diet as tolerated   Hypertensive  Initially hypotensive with shock, off pressors. Now on cleviprex. -wean cleviprex to off for SBP <140  -resume home cozaar, lopressor  A fib with RVR-->currently ST NSTEMI Initially looked like HTN emergency but developed fairly sig hypotension after CCB infusion and Lasix. Echo with LVEF 50-55%, no RWMAs, G1DD. -appreciate Cardiology  -goal K+>4, Mg+ >2  DM type 2 poorly controlled with steroid induced hyperglycemia. -  SSI, resistant scale  -CBG goal 140-180   Anxiety  -wean precedex to off  -resume home lyrica   Best Practice: (right click and "Reselect all SmartList Selections" daily)  Diet/type: Regular consistency (see orders) and NPO DVT prophylaxis: LMWH GI prophylaxis: PPI Lines: N/A Foley:  N/A Code  Status: full code Last date of multidisciplinary goals of care discussion: full code. Patient updated on plan of care.   Critical care time: 31 minutes   Noe Gens, NP [place] [facility] 12/02/21 9:01 AM  Please see [email or web address] for pager details.  From 7A-7P if no response, please call [phone] After hours, please call ELink [phone]

## 2021-12-02 NOTE — Plan of Care (Signed)
  Problem: Activity: Goal: Ability to tolerate increased activity will improve Outcome: Progressing   Problem: Respiratory: Goal: Ability to maintain a clear airway and adequate ventilation will improve Outcome: Progressing   Problem: Education: Goal: Ability to describe self-care measures that may prevent or decrease complications (Diabetes Survival Skills Education) will improve Outcome: Progressing

## 2021-12-02 NOTE — Progress Notes (Addendum)
Rounding Note    Patient Name: James [name] Date of Encounter: 12/02/2021  [place] Cardiologist: Evalina Field, MD   Subjective   Extubated yesterday, throat still slightly sore. Slept a little last night. Alert and oriented x3. Denies any chest pain  Inpatient Medications    Scheduled Meds:  arformoterol  15 mcg Nebulization BID   budesonide (PULMICORT) nebulizer solution  0.5 mg Nebulization BID   Chlorhexidine Gluconate Cloth  6 each Topical Daily   docusate  100 mg Oral BID   enoxaparin (LOVENOX) injection  40 mg Subcutaneous Q24H   feeding supplement (PROSource TF20)  60 mL Per Tube Daily   folic acid  1 mg Per Tube Daily   insulin aspart  0-20 Units Subcutaneous Q4H   losartan  50 mg Oral Daily   methylPREDNISolone (SOLU-MEDROL) injection  40 mg Intravenous Q12H   metoprolol succinate  25 mg Oral Daily   multivitamin with minerals  1 tablet Per Tube Daily   pantoprazole (PROTONIX) IV  40 mg Intravenous Daily   polyethylene glycol  17 g Per Tube Daily   pregabalin  100 mg Oral BID   revefenacin  175 mcg Nebulization Daily   thiamine  100 mg Per Tube Daily   Continuous Infusions:  sodium chloride Stopped (11/29/21 0648)   ampicillin-sulbactam (UNASYN) IV Stopped (12/02/21 0532)   clevidipine 7 mg/hr (12/02/21 0858)   dexmedetomidine (PRECEDEX) IV infusion 0.6 mcg/kg/hr (12/02/21 0603)   feeding supplement (VITAL 1.5 CAL) Stopped (12/01/21 1237)   PRN Meds: acetaminophen (TYLENOL) oral liquid 160 mg/5 mL, albuterol, ondansetron (ZOFRAN) IV, mouth rinse   Vital Signs    Vitals:   12/02/21 0700 12/02/21 0806 12/02/21 0807 12/02/21 0808  BP: 138/67     Pulse: 76     Resp: (!) 21     Temp: 99.1 F (37.3 C)     TempSrc:      SpO2: 96% 97% 96% 97%  Weight:      Height:        Intake/Output Summary (Last 24 hours) at 12/02/2021 0911 Last data filed at 12/02/2021 0600 Gross per 24 hour  Intake 1356.55 ml  Output 3075 ml  Net -1718.45 ml       12/02/2021    3:50 AM 12/01/2021    5:00 AM 11/29/2021    5:00 AM  Last 3 Weights  Weight (lbs) 206 lb 5.6 oz 214 lb 1.1 oz 205 lb 7.5 oz  Weight (kg) 93.6 kg 97.1 kg 93.2 kg      Telemetry    NSR with short episode of SVT yesterday - Personally Reviewed  ECG    NSR with diffuse TWI - Personally Reviewed  Physical Exam   GEN: No acute distress.   Neck: No JVD Cardiac: RRR, no murmurs, rubs, or gallops.  Respiratory: Clear to auscultation bilaterally. GI: Soft, nontender, non-distended  MS: No edema; No deformity. Neuro:  Nonfocal  Psych: Normal affect   Labs    High Sensitivity Troponin:   Recent Labs  Lab 11/28/21 0500 11/28/21 0739 11/28/21 1806 11/28/21 2013 11/29/21 0807  TROPONINIHS 20* 467* 1,676* 1,951* 1,296*     Chemistry Recent Labs  Lab 11/29/21 1630 11/30/21 0447 12/01/21 0529 12/02/21 0403  NA 141 141 145 142  K 4.6 4.5 4.8 4.4  CL 106 107 108 104  CO2 28 27 29 30   GLUCOSE 167* 171* 183* 130*  BUN 26* 31* 37* 39*  CREATININE 0.89 0.63 0.74 0.63  CALCIUM 8.3* 8.1* 8.2* 9.1  MG 2.2  --  2.8* 2.6*  GFRNONAA >60 >60 >60 >60  ANIONGAP 7 7 8 8     Lipids  Recent Labs  Lab 11/29/21 0500  TRIG 85    Hematology Recent Labs  Lab 11/30/21 0447 12/01/21 0529 12/02/21 0403  WBC 17.3* 12.9* 17.7*  RBC 3.49* 3.43* 3.95*  HGB 10.3* 10.1* 11.6*  HCT 33.9* 33.4* 36.8*  MCV 97.1 97.4 93.2  MCH 29.5 29.4 29.4  MCHC 30.4 30.2 31.5  RDW 14.8 14.9 14.6  PLT 255 256 317   Thyroid No results for input(s): "TSH", "FREET4" in the last 168 hours.  BNP Recent Labs  Lab 11/28/21 0500  BNP 50.2    DDimer No results for input(s): "DDIMER" in the last 168 hours.   Radiology    No results found.  Cardiac Studies    1. Left ventricular ejection fraction, by estimation, is 50 to 55%. The  left ventricle has low normal function. The left ventricle has no regional  wall motion abnormalities. Left ventricular diastolic parameters are   consistent with Grade I diastolic  dysfunction (impaired relaxation).   2. Right ventricular systolic function is normal. The right ventricular  size is normal. Tricuspid regurgitation signal is inadequate for assessing  PA pressure.   3. The mitral valve is normal in structure. No evidence of mitral valve  regurgitation.   4. The aortic valve is grossly normal. Aortic valve regurgitation is not  visualized. No aortic stenosis is present.   5. The inferior vena cava is normal in size with greater than 50%  respiratory variability, suggesting right atrial pressure of 3 mmHg.   Comparison(s): Prior images reviewed side by side. The left ventricular  systolic function is worsened. but at the lower limit of normal.    Patient Profile     61 y.o. male with severe COPD on home O2, HTN, HLD, DM II and EtOH abuse admitted on 11/28/2021 with acute respiratory failure secondary to COPD exacerbation and community acquired PNA requiring intubation. Cardiology consulted for elevated troponin  Assessment & Plan    Elevated troponin - in the setting of acute on chronic hypoxic respiratory failure secondary to COPD exacerbation, rhinovirus and CAP requiring intubation - Echo in 10/2019 showed EF 60-65%, repeat echo 11/26 showed EF 50-55% - no prior diagnosis of CAD, but has coronary artery calcification seen on previous CT image.  - serial trop 20-->467-->1676-->1951-->1296. New diffuse TWI noted.  - patient has been extubated, denies any chest pain, therefore pericarditis and myocarditis less likely. Unclear reason behind persistent deep T wave inversion. No plan for cath during this admission. Will consider outpatient ischemic workup. Add ASA and statin today. Restarted on home BP meds.   Probable SVT: had significant hypotension on IV cardizem. No atrial fibrillation. Conservative management, BP improved, on low dose toprol XL  Hypotension: improved, history of hypertension. On home toprol XL and  losartan.   Acute respiratory failure: intubated 11/25, extubated on 12/01/2021. Breathing stable.       For questions or updates, please contact [place] Please consult [email or web address] for contact info under        Signed, Almyra Deforest, [place]  12/02/2021, 9:11 AM    Patient seen and examined with Almyra Deforest PA.  Agree as above, with the following exceptions and changes as noted below.  Patient has been extubated and participates in the conversation. Gen: NAD, CV: RRR, no murmurs, Lungs: clear anteriorly, Abd: soft, Extrem:  Warm, well perfused, no edema, Neuro/Psych: alert and oriented x 3, normal mood and affect. All available labs, radiology testing, previous records reviewed.  Agree with aspirin and statin as above, hypertensive today and was on Cleviprex drip, home blood pressure medications should be resumed.  EKG shows persistent T wave inversions, patient denies chest pain.  Could consider ischemic evaluation prior to hospital dismissal, versus outpatient cath when respiratory illness has resolved, if no obstructive coronary artery disease may want to consider cardiac MRI.  We will reassess after patient has been extubated 24-48 hr and stable from a respiratory standpoint.  Echocardiogram personally reviewed, no definite regional wall motion abnormalities, challenging images but EF appears grossly preserved.  Elouise Munroe, MD 12/02/21 3:21 PM

## 2021-12-03 ENCOUNTER — Encounter (HOSPITAL_COMMUNITY): Payer: Self-pay | Admitting: Pulmonary Disease

## 2021-12-03 DIAGNOSIS — J9602 Acute respiratory failure with hypercapnia: Secondary | ICD-10-CM | POA: Diagnosis not present

## 2021-12-03 DIAGNOSIS — R7989 Other specified abnormal findings of blood chemistry: Secondary | ICD-10-CM

## 2021-12-03 DIAGNOSIS — J9601 Acute respiratory failure with hypoxia: Secondary | ICD-10-CM | POA: Diagnosis not present

## 2021-12-03 DIAGNOSIS — J9622 Acute and chronic respiratory failure with hypercapnia: Secondary | ICD-10-CM | POA: Diagnosis not present

## 2021-12-03 DIAGNOSIS — J441 Chronic obstructive pulmonary disease with (acute) exacerbation: Secondary | ICD-10-CM | POA: Diagnosis not present

## 2021-12-03 DIAGNOSIS — I10 Essential (primary) hypertension: Secondary | ICD-10-CM | POA: Diagnosis not present

## 2021-12-03 DIAGNOSIS — J9621 Acute and chronic respiratory failure with hypoxia: Secondary | ICD-10-CM | POA: Diagnosis not present

## 2021-12-03 LAB — GLUCOSE, CAPILLARY
Glucose-Capillary: 108 mg/dL — ABNORMAL HIGH (ref 70–99)
Glucose-Capillary: 77 mg/dL (ref 70–99)
Glucose-Capillary: 135 mg/dL — ABNORMAL HIGH (ref 70–99)
Glucose-Capillary: 248 mg/dL — ABNORMAL HIGH (ref 70–99)
Glucose-Capillary: 86 mg/dL (ref 70–99)

## 2021-12-03 LAB — CULTURE, BLOOD (ROUTINE X 2)
Culture: NO GROWTH
Culture: NO GROWTH
Special Requests: ADEQUATE

## 2021-12-03 LAB — BASIC METABOLIC PANEL
Anion gap: 10 (ref 5–15)
BUN: 35 mg/dL — ABNORMAL HIGH (ref 6–20)
CO2: 30 mmol/L (ref 22–32)
Calcium: 9.3 mg/dL (ref 8.9–10.3)
Chloride: 102 mmol/L (ref 98–111)
Creatinine, Ser: 0.67 mg/dL (ref 0.61–1.24)
GFR, Estimated: >60 mL/min (ref >60)
Glucose, Bld: 118 mg/dL — ABNORMAL HIGH (ref 70–99)
Potassium: 3.9 mmol/L (ref 3.5–5.1)
Sodium: 142 mmol/L (ref 135–145)

## 2021-12-03 LAB — CBC
HCT: 40.5 % (ref 39.0–52.0)
Hemoglobin: 12.4 g/dL — ABNORMAL LOW (ref 13.0–17.0)
MCH: 28.8 pg (ref 26.0–34.0)
MCHC: 30.6 g/dL (ref 30.0–36.0)
MCV: 94.2 fL (ref 80.0–100.0)
Platelets: 284 10*3/uL (ref 150–400)
RBC: 4.3 MIL/uL (ref 4.22–5.81)
RDW: 14.5 % (ref 11.5–15.5)
WBC: 12.6 10*3/uL — ABNORMAL HIGH (ref 4.0–10.5)
nRBC: 0 % (ref 0.0–0.2)

## 2021-12-03 LAB — MAGNESIUM: Magnesium: 2.3 mg/dL (ref 1.7–2.4)

## 2021-12-03 LAB — PHOSPHORUS: Phosphorus: 4.7 mg/dL — ABNORMAL HIGH (ref 2.5–4.6)

## 2021-12-03 MED ORDER — METOPROLOL SUCCINATE ER 50 MG PO TB24
50.0000 mg | ORAL_TABLET | Freq: Every day | ORAL | Status: DC
  Administered 2021-12-04 – 2021-12-08 (×5): 50 mg via ORAL
  Filled 2021-12-03 (×5): qty 1

## 2021-12-03 MED ORDER — METOPROLOL SUCCINATE ER 25 MG PO TB24
25.0000 mg | ORAL_TABLET | Freq: Once | ORAL | Status: AC
  Administered 2021-12-03: 25 mg via ORAL
  Filled 2021-12-03: qty 1

## 2021-12-03 NOTE — Plan of Care (Signed)
  Problem: Metabolic: Goal: Ability to maintain appropriate glucose levels will improve Outcome: Progressing   Problem: Nutritional: Goal: Maintenance of adequate nutrition will improve Outcome: Progressing   Problem: Clinical Measurements: Goal: Will remain free from infection Outcome: Progressing   Problem: Clinical Measurements: Goal: Diagnostic test results will improve Outcome: Progressing

## 2021-12-03 NOTE — Progress Notes (Signed)
NAME:  James [name], MRN:  [id number], DOB:  [birth year], LOS: 5 ADMISSION DATE:  11/28/2021, CONSULTATION DATE:  62/22/9798 REFERRING MD: Roxanne Mins - EDP, CHIEF COMPLAINT:  Short of breath   History of Present Illness:  61 year old man who presented to the [facility] ED 11/25 with dyspnea.  SpO2 on room air 82%.  Started on therapy for COPD exacerbation.  Found to have AFib with HR in 160s, and BP 217/135 in ER.  Trialed on BiPAP but continued to have respiratory distress. Intubated in ER. Transferred to [facility]. PMHx significant for HTN, COPD with emphysema, lung nodules, tobacco use, prior PTX, T2DM, post-herpetic neuralgia, EtOH use.  Pertinent Medical History:  COPD with emphysema, DM type 2, HTN, Lung nodules, PNA, COVID, Pneumothorax, Post-herpetic neuralgia, ETOH  Significant Hospital Events: Including procedures, antibiotic start and stop dates in addition to other pertinent events   11/25 Admit, intubated, started ABx, start cardizem gtt-->got hypotensive. Converted to SR but started on Neo.  11/26 Overnight transferred from [facility] ED to [facility] ICU 11/27 Remains intubated on Prop/Versed for sedation; increased secretions with high peak pressures on vent, Resp Cx sent. 11/28 Resp Cx with few gram + cocci in pairs. FiO2 needs down to 50%. Secretions improved, but ongoing breath stacking. Sedation to Precedex. Tolerating PSV. Extubated. Hypertensive, started on cleviprex + precedex  Interim History / Subjective:  Off cleviprex  Pt reports feeling short of breath > interfering with eating  O2 not at baseline needs  I/O 4L UOP, -3.4L in last 24 h Glucose range 85-118  Afebrile / WBC 12.6   Objective:  Blood pressure 126/78, pulse 72, temperature 98.6 F (37 C), resp. rate (!) 22, height 5\' 9"  (1.753 m), weight 93.6 kg, SpO2 98 %.    FiO2 (%):  [32 %] 32 %   Intake/Output Summary (Last 24 hours) at 12/03/2021 0814 Last data filed at 12/03/2021 9211 Gross per 24 hour  Intake 528.66 ml  Output  4050 ml  Net -3521.34 ml   Filed Weights   11/29/21 0500 12/01/21 0500 12/02/21 0350  Weight: 93.2 kg 97.1 kg 93.6 kg   Physical Examination: General: chronically ill appearing adult male lying in bed in NAD HEENT: MM pink/moist, [place] O2, right eye with small amt blood in sclera, anicteric Neuro: AAOx4, speech clear, MAE  CV: s1s2 RRR, no m/r/g PULM: non-labored at rest, lungs bilaterally diminished, wheezing GI: soft, bsx4 active  Extremities: warm/dry, trace to 1+ edema  Skin: no rashes or lesions  Resolved Hospital Problem List:  HTN emergency with acute pulmonary edema Circulatory Shock favor mix of sepsis/septic shock and medication related hypotension from sedation and CCB Shock  Assessment & Plan:   Acute on Chronic Hypoxemic and Hypercarbic Respiratory Failure 2/2 Rhinovirus with superimposed Bacterial Pneumonia COPD Exacerbation Baseline 2L O2 at home  -wean O2 for sats 88-94% -continue brovana, pulmicort, yupelri  -duoneb PRN  -continue solumedrol, transition to prednisone in am  -respiratory culture negative -intermittent CXR  -diet as tolerated -pulmonary hygiene - IS, mobilize -will need pulmonary f/u as outpatient after discharge  Hypertensive  Initially hypotensive with shock, off pressors. Now on cleviprex. -Off cleviprex -continue home lopressor, cozaar  -lasix 40 mg QD   A fib with RVR-->currently ST NSTEMI Initially looked like HTN emergency but developed fairly sig hypotension after CCB infusion and Lasix. Echo with LVEF 50-55%, no RWMAs, G1DD. -appreciate Cardiology input  -no plan for LHC this admit  -goal K+>4, Mg+>2  DM type 2 poorly controlled with  steroid induced hyperglycemia. -glucose well controlled -SSI, resistant scale   Anxiety  -continue home lyrica   Deconditioning of Critical Illness  -PT consult   Best Practice: (right click and "Reselect all SmartList Selections" daily)  Diet/type: Regular consistency (see orders) and  NPO DVT prophylaxis: LMWH GI prophylaxis: PPI Lines: N/A Foley:  N/A Code Status: full code Last date of multidisciplinary goals of care discussion: full code. Pt updated on plan of care 11/30.  Critical care time: n/a   To TR[facility] as of 12/1  Noe Gens, NP [place] [facility] 12/03/21 8:14 AM  Please see [email or web address] for pager details.  From 7A-7P if no response, please call [phone] After hours, please call ELink [phone]

## 2021-12-03 NOTE — Progress Notes (Addendum)
Rounding Note    Patient Name: James [name] Date of Encounter: 12/03/2021  [place] Cardiologist: Evalina Field, MD   Subjective   Denies any chest pain. Breathing stable.   Inpatient Medications    Scheduled Meds:  arformoterol  15 mcg Nebulization BID   aspirin  81 mg Oral Daily   atorvastatin  80 mg Oral QHS   budesonide (PULMICORT) nebulizer solution  0.5 mg Nebulization BID   Chlorhexidine Gluconate Cloth  6 each Topical Daily   enoxaparin (LOVENOX) injection  40 mg Subcutaneous D35T   folic acid  1 mg Oral Daily   furosemide  40 mg Intravenous Daily   insulin aspart  0-20 Units Subcutaneous Q4H   losartan  50 mg Oral Daily   methylPREDNISolone (SOLU-MEDROL) injection  40 mg Intravenous Q24H   metoprolol succinate  25 mg Oral Daily   multivitamin with minerals  1 tablet Oral Daily   pantoprazole  40 mg Oral Daily   pregabalin  100 mg Oral BID   revefenacin  175 mcg Nebulization Daily   thiamine  100 mg Oral Daily   Continuous Infusions:  sodium chloride Stopped (11/29/21 0648)   clevidipine Stopped (12/02/21 1741)   PRN Meds: acetaminophen (TYLENOL) oral liquid 160 mg/5 mL, albuterol, ondansetron (ZOFRAN) IV, mouth rinse   Vital Signs    Vitals:   12/03/21 0741 12/03/21 0743 12/03/21 0800 12/03/21 0855  BP:   131/77   Pulse: 72  72   Resp: (!) 22  19   Temp: 98.6 F (37 C)  98.6 F (37 C) 98.1 F (36.7 C)  TempSrc:   Oral Oral  SpO2: 92% 98% 95%   Weight:      Height:        Intake/Output Summary (Last 24 hours) at 12/03/2021 1033 Last data filed at 12/03/2021 1000 Gross per 24 hour  Intake 528.66 ml  Output 5175 ml  Net -4646.34 ml      12/02/2021    3:50 AM 12/01/2021    5:00 AM 11/29/2021    5:00 AM  Last 3 Weights  Weight (lbs) 206 lb 5.6 oz 214 lb 1.1 oz 205 lb 7.5 oz  Weight (kg) 93.6 kg 97.1 kg 93.2 kg      Telemetry    NSR with occasional short busts of SVT with narrow QRS, single episode of wide complex  tachycardia likely accelerated junction rhythm with retrograde p wave rather than NSVT - Personally Reviewed  ECG    NSR with diffuse TWI - Personally Reviewed  Physical Exam   GEN: No acute distress.   Neck: No JVD Cardiac: RRR, no murmurs, rubs, or gallops.  Respiratory: Clear to auscultation bilaterally. GI: Soft, nontender, non-distended  MS: No edema; No deformity. Neuro:  Nonfocal  Psych: Normal affect   Labs    High Sensitivity Troponin:   Recent Labs  Lab 11/28/21 0500 11/28/21 0739 11/28/21 1806 11/28/21 2013 11/29/21 0807  TROPONINIHS 20* 467* 1,676* 1,951* 1,296*     Chemistry Recent Labs  Lab 12/01/21 0529 12/02/21 0403 12/03/21 0300  NA 145 142 142  K 4.8 4.4 3.9  CL 108 104 102  CO2 29 30 30   GLUCOSE 183* 130* 118*  BUN 37* 39* 35*  CREATININE 0.74 0.63 0.67  CALCIUM 8.2* 9.1 9.3  MG 2.8* 2.6* 2.3  GFRNONAA >60 >60 >60  ANIONGAP 8 8 10     Lipids  Recent Labs  Lab 11/29/21 0500  TRIG 85  Hematology Recent Labs  Lab 12/01/21 0529 12/02/21 0403 12/03/21 0300  WBC 12.9* 17.7* 12.6*  RBC 3.43* 3.95* 4.30  HGB 10.1* 11.6* 12.4*  HCT 33.4* 36.8* 40.5  MCV 97.4 93.2 94.2  MCH 29.4 29.4 28.8  MCHC 30.2 31.5 30.6  RDW 14.9 14.6 14.5  PLT 256 317 284   Thyroid No results for input(s): "TSH", "FREET4" in the last 168 hours.  BNP Recent Labs  Lab 11/28/21 0500  BNP 50.2    DDimer No results for input(s): "DDIMER" in the last 168 hours.   Radiology    No results found.  Cardiac Studies   Echo 11/29/2021  1. Left ventricular ejection fraction, by estimation, is 50 to 55%. The  left ventricle has low normal function. The left ventricle has no regional  wall motion abnormalities. Left ventricular diastolic parameters are  consistent with Grade I diastolic  dysfunction (impaired relaxation).   2. Right ventricular systolic function is normal. The right ventricular  size is normal. Tricuspid regurgitation signal is inadequate for  assessing  PA pressure.   3. The mitral valve is normal in structure. No evidence of mitral valve  regurgitation.   4. The aortic valve is grossly normal. Aortic valve regurgitation is not  visualized. No aortic stenosis is present.   5. The inferior vena cava is normal in size with greater than 50%  respiratory variability, suggesting right atrial pressure of 3 mmHg.   Comparison(s): Prior images reviewed side by side. The left ventricular  systolic function is worsened. but at the lower limit of normal.   Patient Profile     61 y.o. male with severe COPD on home O2, HTN, HLD, DM II and EtOH abuse admitted on 11/28/2021 with acute respiratory failure secondary to COPD exacerbation and community acquired PNA requiring intubation. Cardiology consulted for elevated troponin   Assessment & Plan    Elevated troponin - in the setting of acute on chronic hypoxic respiratory failure secondary to COPD exacerbation, rhinovirus and CAP requiring intubation - Echo in 10/2019 showed EF 60-65%, repeat echo 11/26 showed EF 50-55% - no prior diagnosis of CAD, but has coronary artery calcification seen on previous CT image.  - serial trop 20-->467-->1676-->1951-->1296. New diffuse TWI noted.  - patient has been extubated, denies any chest pain, therefore pericarditis and myocarditis less likely. Unclear reason behind persistent deep T wave inversion. ASA and statin added on 11/29 - continue to improve, will check with MD to decide on either inpatient or outpatient ischemic work up. Denies any CP.    Probable SVT: had significant hypotension on IV cardizem. No atrial fibrillation. Conservative management, BP improved, on low dose toprol XL Continue to have bursts of SVT, however transient. Single episode of accelerated junctional rhythm with retrograde p wave?   Hypotension: improved, history of hypertension. On home toprol XL and losartan. BP controlled   Acute respiratory failure: intubated 11/25,  extubated on 12/01/2021. Breathing continue to improve      For questions or updates, please contact [place] Please consult [email or web address] for contact info under        Signed, Almyra Deforest, [place]  12/03/2021, 10:33 AM    Patient seen and examined with Almyra Deforest PA.  Agree as above, with the following exceptions and changes as noted below. Patient is feeling well without chest pain or SOB today. No complaints, not much appetite but overall well. Gen: NAD, CV: RRR, no murmurs, Lungs: clear, Abd: soft, Extrem: Warm, well perfused, no edema,  Neuro/Psych: alert and oriented x 3, normal mood and affect. All available labs, radiology testing, previous records reviewed. Deep T wave inversions persist on EKG. At this time patient not optimized for invasive procedures or cardiac MRI given need for further respiratory recovery to tolerate. Will continue medical management given lack of chest pain and overall improvement. Will consider close follow up and consideration of outpatient cath +/- cardiac MRI. Will uptitrate beta blockade, will need to consider carvedilol given continued HTN.  Elouise Munroe, MD 12/03/21 12:46 PM

## 2021-12-04 ENCOUNTER — Telehealth: Payer: Self-pay | Admitting: Critical Care Medicine

## 2021-12-04 DIAGNOSIS — J9621 Acute and chronic respiratory failure with hypoxia: Secondary | ICD-10-CM | POA: Diagnosis not present

## 2021-12-04 DIAGNOSIS — J9622 Acute and chronic respiratory failure with hypercapnia: Secondary | ICD-10-CM | POA: Diagnosis not present

## 2021-12-04 DIAGNOSIS — F79 Unspecified intellectual disabilities: Secondary | ICD-10-CM | POA: Diagnosis not present

## 2021-12-04 DIAGNOSIS — J9601 Acute respiratory failure with hypoxia: Secondary | ICD-10-CM | POA: Diagnosis not present

## 2021-12-04 DIAGNOSIS — J441 Chronic obstructive pulmonary disease with (acute) exacerbation: Secondary | ICD-10-CM | POA: Diagnosis not present

## 2021-12-04 LAB — GLUCOSE, CAPILLARY
Glucose-Capillary: 107 mg/dL — ABNORMAL HIGH (ref 70–99)
Glucose-Capillary: 119 mg/dL — ABNORMAL HIGH (ref 70–99)
Glucose-Capillary: 124 mg/dL — ABNORMAL HIGH (ref 70–99)
Glucose-Capillary: 164 mg/dL — ABNORMAL HIGH (ref 70–99)
Glucose-Capillary: 190 mg/dL — ABNORMAL HIGH (ref 70–99)
Glucose-Capillary: 95 mg/dL (ref 70–99)

## 2021-12-04 LAB — BASIC METABOLIC PANEL
Anion gap: 9 (ref 5–15)
BUN: 36 mg/dL — ABNORMAL HIGH (ref 6–20)
CO2: 31 mmol/L (ref 22–32)
Calcium: 10.1 mg/dL (ref 8.9–10.3)
Chloride: 102 mmol/L (ref 98–111)
Creatinine, Ser: 0.68 mg/dL (ref 0.61–1.24)
GFR, Estimated: >60 mL/min (ref >60)
Glucose, Bld: 90 mg/dL (ref 70–99)
Potassium: 4.1 mmol/L (ref 3.5–5.1)
Sodium: 142 mmol/L (ref 135–145)

## 2021-12-04 LAB — CBC
HCT: 43.6 % (ref 39.0–52.0)
Hemoglobin: 13.8 g/dL (ref 13.0–17.0)
MCH: 28.9 pg (ref 26.0–34.0)
MCHC: 31.7 g/dL (ref 30.0–36.0)
MCV: 91.2 fL (ref 80.0–100.0)
Platelets: 335 10*3/uL (ref 150–400)
RBC: 4.78 MIL/uL (ref 4.22–5.81)
RDW: 13.8 % (ref 11.5–15.5)
WBC: 15.8 10*3/uL — ABNORMAL HIGH (ref 4.0–10.5)
nRBC: 0 % (ref 0.0–0.2)

## 2021-12-04 LAB — PHOSPHORUS: Phosphorus: 4.6 mg/dL (ref 2.5–4.6)

## 2021-12-04 LAB — MAGNESIUM: Magnesium: 2.3 mg/dL (ref 1.7–2.4)

## 2021-12-04 NOTE — Progress Notes (Addendum)
PROGRESS NOTE    James [name]  ERX:[id number] DOB: 10/02/1960 DOA: 11/28/2021 PCP: [name], MD    Brief Narrative:  61 year old gentleman from home, has history of COPD and chronic hypoxemic respiratory failure on 2 L oxygen at home.  He does have a history of type 2 diabetes, hypertension.  He was found with severe COPD exacerbation secondary to rhinovirus infection, intubated and admitted to ICU.  Also found to have SVT , not Afib 1/25 Admit, intubated, started ABx, start cardizem gtt-->got hypotensive. Converted to SR but started on Neo.  11/26 Overnight transferred from [facility] ED to [facility] ICU 11/27 Remains intubated on Prop/Versed for sedation; increased secretions with high peak pressures on vent, Resp Cx sent. 11/28 Resp Cx with few gram + cocci in pairs. FiO2 needs down to 50%. Secretions improved, but ongoing breath stacking. Sedation to Precedex. Tolerating PSV. Extubated. Hypertensive, started on cleviprex + precedex Cardiology following and diastolic CHF.   Assessment & Plan:   Acute on chronic hypoxemic and hypercapnic respiratory failure: Acute COPD exacerbation due to rhinovirus infection as well as superimposed bacterial pneumonia: On baseline 2 L oxygen at home.  Currently extubated to 2 L of oxygen.  Clinically stabilizing. Continue Brovana, Pulmicort and Yupelri.  On as needed bronchodilator therapy. Solu-Medrol, tapering off. Aggressive chest physiotherapy.  Mobilize with PT OT today.  Will need referral to outpatient pulmonary on discharge. Was on broad-spectrum antibiotics and currently completed.  Essential hypertension with hypertensive emergency: Initially hypotensive after intubation on vasopressors.  Then hypertensive.  Patient was on Cleviprex infusion. Currently blood pressure stabilized on blood pressure and Cozaar.  Also on IV Lasix.  Recurrent SVT:  Echo with ejection fraction 50 to 55%.  Cardiology following.  Currently in sinus rhythm.  On  metoprolol, aspirin.  Improved.  Type 2 diabetes poor control, steroid-induced hyperglycemia: Fairly stable.  Not on treatment at home.  Clinically improving.  Start working with PT OT today.  Lives at home.  Anticipate discharge home tomorrow if adequate improvement.  Will need outpatient follow-up with cardiology and pulmonary.   DVT prophylaxis: enoxaparin (LOVENOX) injection 40 mg Start: 12/01/21 1000   Code Status: Full code Family Communication: None at the bedside Disposition Plan: Status is: Inpatient Remains inpatient appropriate because: Significant respiratory symptoms.  Not mobilized yet.     Consultants:  Critical care Cardiology  Procedures:  Intubation extubation  Antimicrobials:  Completed all antibiotic therapy.   Subjective: Patient seen and examined.  Looks comfortable.  He tells me he feels much better but not yet back to himself.  He has not mobilized so not sure how much difficulties he will have.  Lives at home.  He has a friend that helps.  Uses 2 L oxygen at home.  Objective: Vitals:   12/03/21 2033 12/03/21 2159 12/04/21 0021 12/04/21 0513  BP:   120/86 109/64  Pulse:   83 77  Resp:  18 20 20   Temp:   99.1 F (37.3 C) 97.6 F (36.4 C)  TempSrc:   Oral Oral  SpO2: 98%  94% 90%  Weight:      Height:        Intake/Output Summary (Last 24 hours) at 12/04/2021 0916 Last data filed at 12/03/2021 2159 Gross per 24 hour  Intake 240 ml  Output 2925 ml  Net -2685 ml   Filed Weights   11/29/21 0500 12/01/21 0500 12/02/21 0350  Weight: 93.2 kg 97.1 kg 93.6 kg    Examination:  General exam:  Appears calm and comfortable, able to talk in complete sentences.  Not in any distress. Respiratory system: Poor air entry at both bases.  Occasional expiratory crackles.  Comfortable on 2 L oxygen. Cardiovascular system: S1 & S2 heard, RRR. No pedal edema. Gastrointestinal system: Abdomen is nondistended, soft and nontender. No organomegaly or masses  felt. Normal bowel sounds heard. Central nervous system: Alert and oriented. No focal neurological deficits. Extremities: Symmetric 5 x 5 power. Skin: No rashes, lesions or ulcers Psychiatry: Judgement and insight appear normal. Mood & affect appropriate.     Data Reviewed: I have personally reviewed following labs and imaging studies  CBC: Recent Labs  Lab 11/28/21 0500 11/28/21 0545 11/30/21 0447 12/01/21 0529 12/02/21 0403 12/03/21 0300 12/04/21 0506  WBC 24.6*   < > 17.3* 12.9* 17.7* 12.6* 15.8*  NEUTROABS 18.0*  --   --   --   --   --   --   HGB 15.6   < > 10.3* 10.1* 11.6* 12.4* 13.8  HCT 48.4   < > 33.9* 33.4* 36.8* 40.5 43.6  MCV 92.7   < > 97.1 97.4 93.2 94.2 91.2  PLT 429*   < > 255 256 317 284 335   < > = values in this interval not displayed.   Basic Metabolic Panel: Recent Labs  Lab 11/29/21 0500 11/29/21 1630 11/30/21 0447 12/01/21 0529 12/02/21 0403 12/03/21 0300 12/04/21 0506  NA 142 141 141 145 142 142 142  K 4.8 4.6 4.5 4.8 4.4 3.9 4.1  CL 107 106 107 108 104 102 102  CO2 27 28 27 29 30 30 31   GLUCOSE 149* 167* 171* 183* 130* 118* 90  BUN 21* 26* 31* 37* 39* 35* 36*  CREATININE 0.62 0.89 0.63 0.74 0.63 0.67 0.68  CALCIUM 8.3* 8.3* 8.1* 8.2* 9.1 9.3 10.1  MG 2.3 2.2  --  2.8* 2.6* 2.3 2.3  PHOS 2.4*  --   --  2.6 3.2 4.7* 4.6   GFR: Estimated Creatinine Clearance: 111 mL/min (by C-G formula based on SCr of 0.68 mg/dL). Liver Function Tests: No results for input(s): "AST", "ALT", "ALKPHOS", "BILITOT", "PROT", "ALBUMIN" in the last 168 hours. No results for input(s): "LIPASE", "AMYLASE" in the last 168 hours. No results for input(s): "AMMONIA" in the last 168 hours. Coagulation Profile: No results for input(s): "INR", "PROTIME" in the last 168 hours. Cardiac Enzymes: No results for input(s): "CKTOTAL", "CKMB", "CKMBINDEX", "TROPONINI" in the last 168 hours. BNP (last 3 results) No results for input(s): "PROBNP" in the last 8760  hours. HbA1C: No results for input(s): "HGBA1C" in the last 72 hours. CBG: Recent Labs  Lab 12/03/21 1610 12/03/21 2016 12/04/21 0019 12/04/21 0440 12/04/21 0832  GLUCAP 248* 86 124* 95 107*   Lipid Profile: No results for input(s): "CHOL", "HDL", "LDLCALC", "TRIG", "CHOLHDL", "LDLDIRECT" in the last 72 hours. Thyroid Function Tests: No results for input(s): "TSH", "T4TOTAL", "FREET4", "T3FREE", "THYROIDAB" in the last 72 hours. Anemia Panel: No results for input(s): "VITAMINB12", "FOLATE", "FERRITIN", "TIBC", "IRON", "RETICCTPCT" in the last 72 hours. Sepsis Labs: Recent Labs  Lab 11/28/21 0904 11/30/21 1219 12/01/21 0529  PROCALCITON  --  6.32 3.62  LATICACIDVEN 1.7  --   --     Recent Results (from the past 240 hour(s))  Respiratory (~20 pathogens) panel by PCR     Status: Abnormal   Collection Time: 11/28/21  5:00 AM   Specimen: Nasopharyngeal Swab; Respiratory  Result Value Ref Range Status   Adenovirus NOT DETECTED  NOT DETECTED Final   Coronavirus 229E NOT DETECTED NOT DETECTED Final    Comment: (NOTE) The Coronavirus on the Respiratory Panel, DOES NOT test for the novel  Coronavirus (2019 nCoV)    Coronavirus HKU1 NOT DETECTED NOT DETECTED Final   Coronavirus NL63 NOT DETECTED NOT DETECTED Final   Coronavirus OC43 NOT DETECTED NOT DETECTED Final   Metapneumovirus NOT DETECTED NOT DETECTED Final   Rhinovirus / Enterovirus DETECTED (A) NOT DETECTED Final   Influenza A NOT DETECTED NOT DETECTED Final   Influenza B NOT DETECTED NOT DETECTED Final   Parainfluenza Virus 1 NOT DETECTED NOT DETECTED Final   Parainfluenza Virus 2 NOT DETECTED NOT DETECTED Final   Parainfluenza Virus 3 NOT DETECTED NOT DETECTED Final   Parainfluenza Virus 4 NOT DETECTED NOT DETECTED Final   Respiratory Syncytial Virus NOT DETECTED NOT DETECTED Final   Bordetella pertussis NOT DETECTED NOT DETECTED Final   Bordetella Parapertussis NOT DETECTED NOT DETECTED Final   Chlamydophila  pneumoniae NOT DETECTED NOT DETECTED Final   Mycoplasma pneumoniae NOT DETECTED NOT DETECTED Final    Comment: Performed at [facility], [place] [address] [place], [place] [address]  Resp Panel by RT-PCR (Flu A&B, Covid) Nasopharyngeal Swab     Status: None   Collection Time: 11/28/21  5:00 AM   Specimen: Nasopharyngeal Swab; Nasal Swab  Result Value Ref Range Status   SARS Coronavirus 2 by RT PCR NEGATIVE NEGATIVE Final    Comment: (NOTE) SARS-CoV-2 target nucleic acids are NOT DETECTED.  The SARS-CoV-2 RNA is generally detectable in upper respiratory specimens during the acute phase of infection. The lowest concentration of SARS-CoV-2 viral copies this assay can detect is 138 copies/mL. A negative result does not preclude SARS-Cov-2 infection and should not be used as the sole basis for treatment or other patient management decisions. A negative result may occur with  improper specimen collection/handling, submission of specimen other than nasopharyngeal swab, presence of viral mutation(s) within the areas targeted by this assay, and inadequate number of viral copies(<138 copies/mL). A negative result must be combined with clinical observations, patient history, and epidemiological information. The expected result is Negative.  Fact Sheet for Patients:  EntrepreneurPulse.com.au  Fact Sheet for Healthcare Providers:  IncredibleEmployment.be  This test is no t yet approved or cleared by the [place] FDA and  has been authorized for detection and/or diagnosis of SARS-CoV-2 by FDA under an Emergency Use Authorization (EUA). This EUA will remain  in effect (meaning this test can be used) for the duration of the COVID-19 declaration under Section 564(b)(1) of the Act, 21 U.S.C.section 360bbb-3(b)(1), unless the authorization is terminated  or revoked sooner.       Influenza A by PCR NEGATIVE NEGATIVE Final   Influenza B by PCR NEGATIVE  NEGATIVE Final    Comment: (NOTE) The Xpert Xpress SARS-CoV-2/FLU/RSV plus assay is intended as an aid in the diagnosis of influenza from Nasopharyngeal swab specimens and should not be used as a sole basis for treatment. Nasal washings and aspirates are unacceptable for Xpert Xpress SARS-CoV-2/FLU/RSV testing.  Fact Sheet for Patients: EntrepreneurPulse.com.au  Fact Sheet for Healthcare Providers: IncredibleEmployment.be  This test is not yet approved or cleared by the [place] FDA and has been authorized for detection and/or diagnosis of SARS-CoV-2 by FDA under an Emergency Use Authorization (EUA). This EUA will remain in effect (meaning this test can be used) for the duration of the COVID-19 declaration under Section 564(b)(1) of the Act, 21 U.S.C. section  360bbb-3(b)(1), unless the authorization is terminated or revoked.  Performed at [facility], [place] [address], [place], [place] [address]   Culture, blood (Routine X 2) w Reflex to ID Panel     Status: None   Collection Time: 11/28/21  6:10 AM   Specimen: BLOOD RIGHT HAND  Result Value Ref Range Status   Specimen Description BLOOD RIGHT HAND  Final   Special Requests   Final    BOTTLES DRAWN AEROBIC AND ANAEROBIC Blood Culture adequate volume   Culture   Final    NO GROWTH 5 DAYS Performed at [facility], [place] [address], [place], [place] [address]    Report Status 12/03/2021 FINAL  Final  Culture, blood (Routine X 2) w Reflex to ID Panel     Status: None   Collection Time: 11/28/21  6:15 AM   Specimen: BLOOD LEFT HAND  Result Value Ref Range Status   Specimen Description BLOOD LEFT HAND  Final   Special Requests   Final    BOTTLES DRAWN AEROBIC AND ANAEROBIC Blood Culture results may not be optimal due to an inadequate volume of blood received in culture bottles   Culture   Final    NO GROWTH 5 DAYS Performed at [facility], [place] [address]  [place], [place] [address]    Report Status 12/03/2021 FINAL  Final  Culture, Respiratory w Gram Stain     Status: None   Collection Time: 11/30/21  8:26 AM   Specimen: Tracheal Aspirate; Respiratory  Result Value Ref Range Status   Specimen Description   Final    TRACHEAL ASPIRATE Performed at [place] [address], [place], [place] [address]    Special Requests   Final    NONE Performed at [facility], [place] [address], [place], [place] [address]    Gram Stain   Final    ABUNDANT WBC PRESENT, PREDOMINANTLY PMN FEW GRAM POSITIVE COCCI IN PAIRS    Culture   Final    RARE Normal respiratory flora-no Staph aureus or Pseudomonas seen Performed at [place] [address] [place], [place] [address]    Report Status 12/02/2021 FINAL  Final         Radiology Studies: No results found.      Scheduled Meds:  arformoterol  15 mcg Nebulization BID   aspirin  81 mg Oral Daily   atorvastatin  80 mg Oral QHS   budesonide (PULMICORT) nebulizer solution  0.5 mg Nebulization BID   Chlorhexidine Gluconate Cloth  6 each Topical Daily   enoxaparin (LOVENOX) injection  40 mg Subcutaneous Y30Z   folic acid  1 mg Oral Daily   furosemide  40 mg Intravenous Daily   insulin aspart  0-20 Units Subcutaneous Q4H   losartan  50 mg Oral Daily   methylPREDNISolone (SOLU-MEDROL) injection  40 mg Intravenous Q24H   metoprolol succinate  50 mg Oral Daily   multivitamin with minerals  1 tablet Oral Daily   pantoprazole  40 mg Oral Daily   pregabalin  100 mg Oral BID   revefenacin  175 mcg Nebulization Daily   thiamine  100 mg Oral Daily   Continuous Infusions:  sodium chloride Stopped (11/29/21 0648)     LOS: 6 days    Time spent: 35 minutes.    Barb Merino, MD [facility] Pager [phone]

## 2021-12-04 NOTE — Progress Notes (Addendum)
Physical Therapy Treatment Patient Details Name: James [name] MRN: [id number] DOB: 11/23/1960 Today's Date: 12/04/2021   History of Present Illness 62 y.o. male  admitted on 11/28/2021 with acute respiratory failure secondary to COPD exacerbation and community acquired PNA requiring intubation. extubated 12/01/21. Cardiology consulted for elevated troponin. PMH: COPD on home O2, HTN, HLD, DM II and ETOH abuse    PT Comments    Patient c/o of 3/10 pain in bilateral LE's, no increase in pain throughout session. Found patient in bed on 3L of oxygen prior to session. Lowered SpO2 to 2L due to pt stating he uses 2L at home, SpO2 at 96%. Required min guard for bed mobility for safety with increased time needed. Pt stopped midway and took a break "I'm okay just taking my time." Min assist needed for transfers to rise and stabilize from elevated bed and recliner. Patient required min assist to ambulate a total of 150 ft (90 ft + 60 ft) with RW with recliner follow for safety. 2 rest breaks needed due to weakness in LE's and fatigue. Lowest SpO2 on 2L was 91% during ambulation and highest HR 110. Ambulated with short shuffling steps, scissoring at times, and foot drop (corrected foot drop with cues). Required cues to increase step length, prevent scissoring, and to pick up feet when ambulating. Pt anxious while SPTA took vitals. "Is that good" "what does that mean" "Am I doing okay?" Pt was motivated to walk back to his room, however returned patient to recliner due to LE's shaking and SOB noted. After sitting pt stated "Oh yeah that was a good idea." Left patient on 2L of oxygen and alerted nurse. Pt will need ST Rehab at SNF to address mobility and functional decline prior to safely returning home.    Recommendations for follow up therapy are one component of a multi-disciplinary discharge planning process, led by the attending physician.  Recommendations may be updated based on patient status, additional  functional criteria and insurance authorization.  Follow Up Recommendations  Skilled nursing-short term rehab (<3 hours/day) Can patient physically be transported by private vehicle: No   Assistance Recommended at Discharge Frequent or constant Supervision/Assistance  Patient can return home with the following Help with stairs or ramp for entrance;Assist for transportation;Assistance with cooking/housework;A lot of help with bathing/dressing/bathroom;Two people to help with bathing/dressing/bathroom;Two people to help with walking and/or transfers   Equipment Recommendations  Other (comment)    Recommendations for Other Services       Precautions / Restrictions Precautions Precautions: Fall Restrictions Weight Bearing Restrictions: No     Mobility  Bed Mobility Overal bed mobility: Needs Assistance Bed Mobility: Supine to Sit     Supine to sit: Min guard     General bed mobility comments: Increased time needed. Pt stopped midway and took a break "I'm okay just taking my time" Patient Response: Anxious  Transfers Overall transfer level: Needs assistance Equipment used: Rolling walker (2 wheels) Transfers: Sit to/from Stand Sit to Stand: Min assist, From elevated surface           General transfer comment: Min assist to rise and stabilize from elevated bed.    Ambulation/Gait Ambulation/Gait assistance: Min assist Gait Distance (Feet): 150 Feet Assistive device: Rolling walker (2 wheels) Gait Pattern/deviations: Step-through pattern, Decreased stride length, Scissoring, Shuffle, Narrow base of support Gait velocity: decr     General Gait Details: Patient required min assist to ambulate 150 ft with RW. 2 rest breaks needed due to weakness in LE's  and fatigue. Lowest SpO2 on 2L was 91% during ambulation and highest HR 110. Ambulated with short shuffling steps, scissoring at times, and foot drop. Required cues to increase step length, prevent scissoring, and to pick  up feet when ambulating. Pt anxious while SPTA took vitals. "Is that good" "what does that mean" "Am I doing okay?"   Stairs             Wheelchair Mobility    Modified Rankin (Stroke Patients Only)       Balance                                            Cognition Arousal/Alertness: Awake/alert Behavior During Therapy: WFL for tasks assessed/performed Overall Cognitive Status: Within Functional Limits for tasks assessed                                          Exercises      General Comments        Pertinent Vitals/Pain Pain Assessment Pain Assessment: 0-10 Pain Score: 3  Pain Location: bilateral LE's Pain Descriptors / Indicators: Grimacing, Sore Pain Intervention(s): Limited activity within patient's tolerance, Monitored during session    Home Living                          Prior Function            PT Goals (current goals can now be found in the care plan section) Acute Rehab PT Goals Patient Stated Goal: get better PT Goal Formulation: With patient Time For Goal Achievement: 12/16/21 Potential to Achieve Goals: Good Progress towards PT goals: Progressing toward goals    Frequency    Min 2X/week      PT Plan      Co-evaluation              AM-PAC PT "6 Clicks" Mobility   Outcome Measure  Help needed turning from your back to your side while in a flat bed without using bedrails?: A Little Help needed moving from lying on your back to sitting on the side of a flat bed without using bedrails?: A Little Help needed moving to and from a bed to a chair (including a wheelchair)?: A Lot Help needed standing up from a chair using your arms (e.g., wheelchair or bedside chair)?: A Little Help needed to walk in hospital room?: A Little Help needed climbing 3-5 steps with a railing? : Total 6 Click Score: 15    End of Session Equipment Utilized During Treatment: Gait belt;Oxygen Activity  Tolerance: Patient limited by fatigue Patient left: in chair;with chair alarm set;with call bell/phone within reach Nurse Communication: Mobility status PT Visit Diagnosis: Difficulty in walking, not elsewhere classified (R26.2);Muscle weakness (generalized) (M62.81);Other abnormalities of gait and mobility (R26.89)     Time: 8315-1761 PT Time Calculation (min) (ACUTE ONLY): 24 min  Charges:  $Gait Training: 8-22 mins $Therapeutic Activity: 8-22 mins                        Carolanne Mercier 12/04/2021, 3:17 PM

## 2021-12-04 NOTE — Progress Notes (Signed)
Nutrition Follow-up  DOCUMENTATION CODES:   Non-severe (moderate) malnutrition in context of chronic illness  INTERVENTION:  - Heart diet per MD as appropriate. Consider Carb Modified due to history of Diabetes.  - Encourage intake of 3 healthful meals a day.  - Continue daily multivitamin, folic acid, and thiamine due to history of EtOH abuse.  - Monitor weight trends.    NUTRITION DIAGNOSIS:   Moderate Malnutrition related to chronic illness (COPD, EtOH abuse) as evidenced by mild fat depletion, mild muscle depletion.  GOAL:   Patient will meet greater than or equal to 90% of their needs  MONITOR:   PO intake, Weight trends  REASON FOR ASSESSMENT:   Consult Enteral/tube feeding initiation and management (trickles)  ASSESSMENT:   Pt with hx of DM type 2, HTN, COPD, and EtOH abuse presented to ED with SOB.  11/25 Intubated 11/28 Extubated, diet advanced to Heart  Patient reports UBW of 160# and denied recent changes in weight. Per EMR, weight has been between 197-210# the past year with no significant changes.  Patient endorses typically eating 3 meals a day plus 2 snacks at home. Notes good appetite at baseline.  He admits his appetite after extubation was very poor but has since improved and he reports eating well yesterday and today. Only meal documented was yesterday - 75% of breakfast. Patient is hopeful to discharge soon. No questions or concerns.  Medications reviewed and include: Lasix, Insulin, Folic acid, MVI, Thiamine  Labs reviewed:  HA1C 6.5 Blood Glucose 77-248 x24 hours   NUTRITION - FOCUSED PHYSICAL EXAM:  Flowsheet Row Most Recent Value  Orbital Region Mild depletion  Upper Arm Region Mild depletion  Thoracic and Lumbar Region No depletion  Buccal Region Mild depletion  Temple Region Moderate depletion  Clavicle Bone Region Mild depletion  Clavicle and Acromion Bone Region Mild depletion  Scapular Bone Region Unable to assess  Dorsal Hand  Mild depletion  Patellar Region Mild depletion  Anterior Thigh Region Mild depletion  Posterior Calf Region Mild depletion  Edema (RD Assessment) Mild  Hair Reviewed  Eyes Reviewed  Mouth Reviewed  Skin Reviewed  Nails Reviewed       Diet Order:   Diet Order             Diet Heart Room service appropriate? Yes; Fluid consistency: Thin  Diet effective now                   EDUCATION NEEDS:  No education needs have been identified at this time  Skin:  Skin Assessment: Reviewed RN Assessment  Last BM:  11/30  Height:  Ht Readings from Last 1 Encounters:  12/01/21 5\' 9"  (1.753 m)   Weight:  Wt Readings from Last 1 Encounters:  12/02/21 93.6 kg   Ideal Body Weight:  72.7 kg  BMI:  Body mass index is 30.47 kg/m.  Estimated Nutritional Needs:  Kcal:  2100-2300 kcal/d Protein:  110-130g/d Fluid:  2.2-2.4 L/d    Samson Frederic RD, LDN For contact information, refer to [facility].

## 2021-12-04 NOTE — Telephone Encounter (Signed)
Copied from [place] [phone]. Topic: General - Other >> Dec 04, 2021  3:55 PM Oley Balm E wrote: Reason for CRM: [employer] called requesting an update on the fax requests from earlier this month and the last month.   Best contact: [phone]  Needs a fax the most recent office visit notes that discusses the patient's needs for the equipment.

## 2021-12-04 NOTE — Progress Notes (Signed)
Rounding Note    Patient Name: James [name] Date of Encounter: 12/04/2021  [place] Cardiologist: Evalina Field, MD   Subjective   Denies any chest pain. Breathing stable.   Inpatient Medications    Scheduled Meds:  arformoterol  15 mcg Nebulization BID   aspirin  81 mg Oral Daily   atorvastatin  80 mg Oral QHS   budesonide (PULMICORT) nebulizer solution  0.5 mg Nebulization BID   Chlorhexidine Gluconate Cloth  6 each Topical Daily   enoxaparin (LOVENOX) injection  40 mg Subcutaneous F79K   folic acid  1 mg Oral Daily   furosemide  40 mg Intravenous Daily   insulin aspart  0-20 Units Subcutaneous Q4H   losartan  50 mg Oral Daily   methylPREDNISolone (SOLU-MEDROL) injection  40 mg Intravenous Q24H   metoprolol succinate  50 mg Oral Daily   multivitamin with minerals  1 tablet Oral Daily   pantoprazole  40 mg Oral Daily   pregabalin  100 mg Oral BID   revefenacin  175 mcg Nebulization Daily   thiamine  100 mg Oral Daily   Continuous Infusions:  sodium chloride Stopped (11/29/21 0648)   PRN Meds: acetaminophen (TYLENOL) oral liquid 160 mg/5 mL, albuterol, ondansetron (ZOFRAN) IV, mouth rinse   Vital Signs    Vitals:   12/04/21 0021 12/04/21 0513 12/04/21 0917 12/04/21 1004  BP: 120/86 109/64  111/72  Pulse: 83 77  87  Resp: 20 20 (!) 26   Temp: 99.1 F (37.3 C) 97.6 F (36.4 C)  97.9 F (36.6 C)  TempSrc: Oral Oral  Oral  SpO2: 94% 90% 94% 91%  Weight:      Height:        Intake/Output Summary (Last 24 hours) at 12/04/2021 1112 Last data filed at 12/03/2021 2159 Gross per 24 hour  Intake 120 ml  Output 1800 ml  Net -1680 ml      12/02/2021    3:50 AM 12/01/2021    5:00 AM 11/29/2021    5:00 AM  Last 3 Weights  Weight (lbs) 206 lb 5.6 oz 214 lb 1.1 oz 205 lb 7.5 oz  Weight (kg) 93.6 kg 97.1 kg 93.2 kg      Telemetry    NSR with frequent PACsPersonally Reviewed  ECG    NSR with diffuse TWI - Personally Reviewed  Physical  Exam   GEN: No acute distress.   Neck: No JVD Cardiac: RRR, no murmurs, rubs, or gallops.  Respiratory: Clear to auscultation bilaterally. GI: Soft, nontender, non-distended  MS: No edema; No deformity. Neuro:  Nonfocal  Psych: Normal affect   Labs    High Sensitivity Troponin:   Recent Labs  Lab 11/28/21 0500 11/28/21 0739 11/28/21 1806 11/28/21 2013 11/29/21 0807  TROPONINIHS 20* 467* 1,676* 1,951* 1,296*     Chemistry Recent Labs  Lab 12/02/21 0403 12/03/21 0300 12/04/21 0506  NA 142 142 142  K 4.4 3.9 4.1  CL 104 102 102  CO2 30 30 31   GLUCOSE 130* 118* 90  BUN 39* 35* 36*  CREATININE 0.63 0.67 0.68  CALCIUM 9.1 9.3 10.1  MG 2.6* 2.3 2.3  GFRNONAA >60 >60 >60  ANIONGAP 8 10 9     Lipids  Recent Labs  Lab 11/29/21 0500  TRIG 85    Hematology Recent Labs  Lab 12/02/21 0403 12/03/21 0300 12/04/21 0506  WBC 17.7* 12.6* 15.8*  RBC 3.95* 4.30 4.78  HGB 11.6* 12.4* 13.8  HCT 36.8* 40.5  43.6  MCV 93.2 94.2 91.2  MCH 29.4 28.8 28.9  MCHC 31.5 30.6 31.7  RDW 14.6 14.5 13.8  PLT 317 284 335   Thyroid No results for input(s): "TSH", "FREET4" in the last 168 hours.  BNP Recent Labs  Lab 11/28/21 0500  BNP 50.2    DDimer No results for input(s): "DDIMER" in the last 168 hours.   Radiology    No results found.  Cardiac Studies   Echo 11/29/2021  1. Left ventricular ejection fraction, by estimation, is 50 to 55%. The  left ventricle has low normal function. The left ventricle has no regional  wall motion abnormalities. Left ventricular diastolic parameters are  consistent with Grade I diastolic  dysfunction (impaired relaxation).   2. Right ventricular systolic function is normal. The right ventricular  size is normal. Tricuspid regurgitation signal is inadequate for assessing  PA pressure.   3. The mitral valve is normal in structure. No evidence of mitral valve  regurgitation.   4. The aortic valve is grossly normal. Aortic valve  regurgitation is not  visualized. No aortic stenosis is present.   5. The inferior vena cava is normal in size with greater than 50%  respiratory variability, suggesting right atrial pressure of 3 mmHg.   Comparison(s): Prior images reviewed side by side. The left ventricular  systolic function is worsened. but at the lower limit of normal.   Patient Profile     61 y.o. male with severe COPD on home O2, HTN, HLD, DM II and EtOH abuse admitted on 11/28/2021 with acute respiratory failure secondary to COPD exacerbation and community acquired PNA requiring intubation. Cardiology consulted for elevated troponin   Assessment & Plan    Elevated troponin - in the setting of acute on chronic hypoxic respiratory failure secondary to COPD exacerbation, rhinovirus and CAP requiring intubation - Echo in 10/2019 showed EF 60-65%, repeat echo 11/26 showed EF 50-55% - no prior diagnosis of CAD, but has coronary artery calcification seen on previous CT image.  - serial trop 20-->467-->1676-->1951-->1296. New diffuse TWI noted.  - patient has been extubated, denies any chest pain, therefore pericarditis and myocarditis less likely. Unclear reason behind persistent deep T wave inversion. ASA and statin added. - continue to improve, will consider outpatient ischemic workup in close follow up vs prior to dismissal pending ongoing recovery.  Probable SVT: had significant hypotension on IV cardizem. No atrial fibrillation. Conservative management, BP improved, on toprol XL, uptitrated.    Hypotension: improved, history of hypertension. On toprol XL and losartan. BP controlled and improved, briefly required nicardipine drip.    Acute respiratory failure: intubated 11/25, extubated on 12/01/2021. Breathing continue to improve  No changes to cardiovascular recommendations today.      For questions or updates, please contact [place] Please consult [email or web address] for contact info under         Signed, Elouise Munroe, MD  12/04/2021, 11:12 AM

## 2021-12-04 NOTE — Telephone Encounter (Signed)
Copied from [place] [phone]. Topic: General - Other >> Dec 04, 2021  3:55 PM Oley Balm E wrote: Reason for CRM: [employer] called requesting an update on the fax requests from earlier this month and the last month.   Best contact: [phone]  Needs a fax the most recent office visit notes that discusses the patient's needs for the equipment.

## 2021-12-04 NOTE — Telephone Encounter (Signed)
Copied from [place] [phone]. Topic: General - Other >> Dec 04, 2021  3:55 PM Oley Balm E wrote: Reason for CRM: [employer] called requesting an update on the fax requests from earlier this month and the last month.   Best contact: [phone]  Needs a fax the most recent office visit notes that discusses the patient's needs for the equipment.

## 2021-12-05 DIAGNOSIS — I214 Non-ST elevation (NSTEMI) myocardial infarction: Secondary | ICD-10-CM | POA: Insufficient documentation

## 2021-12-05 DIAGNOSIS — J9622 Acute and chronic respiratory failure with hypercapnia: Secondary | ICD-10-CM | POA: Diagnosis not present

## 2021-12-05 DIAGNOSIS — E440 Moderate protein-calorie malnutrition: Secondary | ICD-10-CM | POA: Insufficient documentation

## 2021-12-05 DIAGNOSIS — J9621 Acute and chronic respiratory failure with hypoxia: Secondary | ICD-10-CM | POA: Diagnosis not present

## 2021-12-05 DIAGNOSIS — J9602 Acute respiratory failure with hypercapnia: Secondary | ICD-10-CM | POA: Diagnosis not present

## 2021-12-05 DIAGNOSIS — J9601 Acute respiratory failure with hypoxia: Secondary | ICD-10-CM | POA: Diagnosis not present

## 2021-12-05 HISTORY — DX: Non-ST elevation (NSTEMI) myocardial infarction: I21.4

## 2021-12-05 LAB — MAGNESIUM: Magnesium: 2.2 mg/dL (ref 1.7–2.4)

## 2021-12-05 LAB — PHOSPHORUS: Phosphorus: 3.9 mg/dL (ref 2.5–4.6)

## 2021-12-05 LAB — CBC
HCT: 44.6 % (ref 39.0–52.0)
Hemoglobin: 14.3 g/dL (ref 13.0–17.0)
MCH: 28.8 pg (ref 26.0–34.0)
MCHC: 32.1 g/dL (ref 30.0–36.0)
MCV: 89.9 fL (ref 80.0–100.0)
Platelets: 339 10*3/uL (ref 150–400)
RBC: 4.96 MIL/uL (ref 4.22–5.81)
RDW: 13.6 % (ref 11.5–15.5)
WBC: 14.5 10*3/uL — ABNORMAL HIGH (ref 4.0–10.5)
nRBC: 0 % (ref 0.0–0.2)

## 2021-12-05 LAB — BASIC METABOLIC PANEL
Anion gap: 9 (ref 5–15)
BUN: 48 mg/dL — ABNORMAL HIGH (ref 6–20)
CO2: 30 mmol/L (ref 22–32)
Calcium: 9.5 mg/dL (ref 8.9–10.3)
Chloride: 100 mmol/L (ref 98–111)
Creatinine, Ser: 0.84 mg/dL (ref 0.61–1.24)
GFR, Estimated: >60 mL/min (ref >60)
Glucose, Bld: 102 mg/dL — ABNORMAL HIGH (ref 70–99)
Potassium: 4 mmol/L (ref 3.5–5.1)
Sodium: 139 mmol/L (ref 135–145)

## 2021-12-05 LAB — GLUCOSE, CAPILLARY
Glucose-Capillary: 121 mg/dL — ABNORMAL HIGH (ref 70–99)
Glucose-Capillary: 122 mg/dL — ABNORMAL HIGH (ref 70–99)
Glucose-Capillary: 129 mg/dL — ABNORMAL HIGH (ref 70–99)
Glucose-Capillary: 196 mg/dL — ABNORMAL HIGH (ref 70–99)
Glucose-Capillary: 220 mg/dL — ABNORMAL HIGH (ref 70–99)
Glucose-Capillary: 98 mg/dL (ref 70–99)
Glucose-Capillary: 98 mg/dL (ref 70–99)

## 2021-12-05 MED ORDER — PREDNISONE 20 MG PO TABS: 20.0000 mg | ORAL_TABLET | Freq: Every day | ORAL | Status: AC

## 2021-12-05 MED ORDER — PREDNISONE 20 MG PO TABS
20.0000 mg | ORAL_TABLET | Freq: Every day | ORAL | Status: DC
  Administered 2021-12-06 – 2021-12-08 (×3): 20 mg via ORAL
  Filled 2021-12-05 (×3): qty 1

## 2021-12-05 NOTE — Progress Notes (Signed)
PROGRESS NOTE    James [name]  YIF:[id number] DOB: 1960-01-24 DOA: 11/28/2021 PCP: [name], MD    Brief Narrative:  61 year old gentleman from home, has history of COPD and chronic hypoxemic respiratory failure on 2 L oxygen at home.  He does have a history of type 2 diabetes, hypertension.  He was found with severe COPD exacerbation secondary to rhinovirus infection, intubated and admitted to ICU.  Also found to have SVT , not Afib 1/25 Admit, intubated, started ABx, start cardizem gtt-->got hypotensive. Converted to SR but started on Neo.  11/26 Overnight transferred from [facility] ED to [facility] ICU 11/27 Remains intubated on Prop/Versed for sedation; increased secretions with high peak pressures on vent, Resp Cx sent. 11/28 Resp Cx with few gram + cocci in pairs. FiO2 needs down to 50%. Secretions improved, but ongoing breath stacking. Sedation to Precedex. Tolerating PSV. Extubated. Hypertensive, started on cleviprex + precedex Cardiology following and diastolic CHF. Ischemic evaluation with left heart cath planned for 12/4.   Assessment & Plan:   Acute on chronic hypoxemic and hypercapnic respiratory failure: Acute COPD exacerbation due to rhinovirus infection as well as superimposed bacterial pneumonia: On baseline 2 L oxygen at home.  Currently extubated to 2 L of oxygen.  Clinically stabilizing. Continue Brovana, Pulmicort and Yupelri.  On as needed bronchodilator therapy. Solu-Medrol, tapering off.  Will change to prednisone. Aggressive chest physiotherapy.  Mobilize with PT OT today.  Will need referral to outpatient pulmonary on discharge. Was on broad-spectrum antibiotics and currently completed.  Essential hypertension with hypertensive emergency: Initially hypotensive after intubation on vasopressors.  Then hypertensive.  Patient was on Cleviprex infusion. Currently blood pressure stabilized on blood pressure and Cozaar.  Also on IV Lasix.  Recurrent SVT:  Echo with  ejection fraction 50 to 55%.  Cardiology following.  Currently in sinus rhythm.  On metoprolol, aspirin.  Improved.  Type 2 diabetes poor control, steroid-induced hyperglycemia: Fairly stable.  Not on treatment at home.  Elevated troponins with T wave depression: Respiratory status improving.  Cardiology planning for left heart cath 12/4.  Clinically improving.  Continue to work with PT OT.  PT recommended SNF but patient feels like he can go home.  Probably next few days and after cardiac cath he will be able to go home at home as therapies.  DVT prophylaxis: enoxaparin (LOVENOX) injection 40 mg Start: 12/01/21 1000   Code Status: Full code Family Communication: None at the bedside Disposition Plan: Status is: Inpatient Remains inpatient appropriate because: Significant respiratory symptoms.  Inpatient procedure planned.     Consultants:  Critical care Cardiology  Procedures:  Intubation extubation  Antimicrobials:  Completed all antibiotic therapy.   Subjective:  Patient seen and examined.  Denies any complaints.  He was able to get out of the bed to the chair with minimal help.  Breathing is improving.  Denies any chest pain.  Objective: Vitals:   12/04/21 2029 12/05/21 0415 12/05/21 0638 12/05/21 0641  BP:  (!) 104/51    Pulse:  76    Resp:  20    Temp:  98.4 F (36.9 C)    TempSrc:      SpO2: 96% 93% 95% 96%  Weight:      Height:        Intake/Output Summary (Last 24 hours) at 12/05/2021 1137 Last data filed at 12/04/2021 1725 Gross per 24 hour  Intake --  Output 700 ml  Net -700 ml   Filed Weights   11/29/21  0500 12/01/21 0500 12/02/21 0350  Weight: 93.2 kg 97.1 kg 93.6 kg    Examination:  General exam: Appears calm and comfortable, able to talk in complete sentences.  Not in any distress.  Chronically sick looking.  Frail. Respiratory system: Poor air entry at both bases.  Occasional expiratory crackles.  Comfortable on 2 L oxygen. Cardiovascular  system: S1 & S2 heard, RRR. No pedal edema. Gastrointestinal system: Abdomen is nondistended, soft and nontender. No organomegaly or masses felt. Normal bowel sounds heard. Central nervous system: Alert and oriented. No focal neurological deficits. Extremities: Symmetric 5 x 5 power. Skin: No rashes, lesions or ulcers Psychiatry: Judgement and insight appear normal. Mood & affect appropriate.     Data Reviewed: I have personally reviewed following labs and imaging studies  CBC: Recent Labs  Lab 12/01/21 0529 12/02/21 0403 12/03/21 0300 12/04/21 0506 12/05/21 0536  WBC 12.9* 17.7* 12.6* 15.8* 14.5*  HGB 10.1* 11.6* 12.4* 13.8 14.3  HCT 33.4* 36.8* 40.5 43.6 44.6  MCV 97.4 93.2 94.2 91.2 89.9  PLT 256 317 284 335 956   Basic Metabolic Panel: Recent Labs  Lab 12/01/21 0529 12/02/21 0403 12/03/21 0300 12/04/21 0506 12/05/21 0536  NA 145 142 142 142 139  K 4.8 4.4 3.9 4.1 4.0  CL 108 104 102 102 100  CO2 29 30 30 31 30   GLUCOSE 183* 130* 118* 90 102*  BUN 37* 39* 35* 36* 48*  CREATININE 0.74 0.63 0.67 0.68 0.84  CALCIUM 8.2* 9.1 9.3 10.1 9.5  MG 2.8* 2.6* 2.3 2.3 2.2  PHOS 2.6 3.2 4.7* 4.6 3.9   GFR: Estimated Creatinine Clearance: 105.7 mL/min (by C-G formula based on SCr of 0.84 mg/dL). Liver Function Tests: No results for input(s): "AST", "ALT", "ALKPHOS", "BILITOT", "PROT", "ALBUMIN" in the last 168 hours. No results for input(s): "LIPASE", "AMYLASE" in the last 168 hours. No results for input(s): "AMMONIA" in the last 168 hours. Coagulation Profile: No results for input(s): "INR", "PROTIME" in the last 168 hours. Cardiac Enzymes: No results for input(s): "CKTOTAL", "CKMB", "CKMBINDEX", "TROPONINI" in the last 168 hours. BNP (last 3 results) No results for input(s): "PROBNP" in the last 8760 hours. HbA1C: No results for input(s): "HGBA1C" in the last 72 hours. CBG: Recent Labs  Lab 12/04/21 1704 12/04/21 2018 12/05/21 0006 12/05/21 0413 12/05/21 0806   GLUCAP 164* 119* 121* 122* 98   Lipid Profile: No results for input(s): "CHOL", "HDL", "LDLCALC", "TRIG", "CHOLHDL", "LDLDIRECT" in the last 72 hours. Thyroid Function Tests: No results for input(s): "TSH", "T4TOTAL", "FREET4", "T3FREE", "THYROIDAB" in the last 72 hours. Anemia Panel: No results for input(s): "VITAMINB12", "FOLATE", "FERRITIN", "TIBC", "IRON", "RETICCTPCT" in the last 72 hours. Sepsis Labs: Recent Labs  Lab 11/30/21 1219 12/01/21 0529  PROCALCITON 6.32 3.62    Recent Results (from the past 240 hour(s))  Respiratory (~20 pathogens) panel by PCR     Status: Abnormal   Collection Time: 11/28/21  5:00 AM   Specimen: Nasopharyngeal Swab; Respiratory  Result Value Ref Range Status   Adenovirus NOT DETECTED NOT DETECTED Final   Coronavirus 229E NOT DETECTED NOT DETECTED Final    Comment: (NOTE) The Coronavirus on the Respiratory Panel, DOES NOT test for the novel  Coronavirus (2019 nCoV)    Coronavirus HKU1 NOT DETECTED NOT DETECTED Final   Coronavirus NL63 NOT DETECTED NOT DETECTED Final   Coronavirus OC43 NOT DETECTED NOT DETECTED Final   Metapneumovirus NOT DETECTED NOT DETECTED Final   Rhinovirus / Enterovirus DETECTED (A) NOT DETECTED Final  Influenza A NOT DETECTED NOT DETECTED Final   Influenza B NOT DETECTED NOT DETECTED Final   Parainfluenza Virus 1 NOT DETECTED NOT DETECTED Final   Parainfluenza Virus 2 NOT DETECTED NOT DETECTED Final   Parainfluenza Virus 3 NOT DETECTED NOT DETECTED Final   Parainfluenza Virus 4 NOT DETECTED NOT DETECTED Final   Respiratory Syncytial Virus NOT DETECTED NOT DETECTED Final   Bordetella pertussis NOT DETECTED NOT DETECTED Final   Bordetella Parapertussis NOT DETECTED NOT DETECTED Final   Chlamydophila pneumoniae NOT DETECTED NOT DETECTED Final   Mycoplasma pneumoniae NOT DETECTED NOT DETECTED Final    Comment: Performed at [facility], [place] [address], [place], [place] [address]  Resp Panel by RT-PCR (Flu A&B,  Covid) Nasopharyngeal Swab     Status: None   Collection Time: 11/28/21  5:00 AM   Specimen: Nasopharyngeal Swab; Nasal Swab  Result Value Ref Range Status   SARS Coronavirus 2 by RT PCR NEGATIVE NEGATIVE Final    Comment: (NOTE) SARS-CoV-2 target nucleic acids are NOT DETECTED.  The SARS-CoV-2 RNA is generally detectable in upper respiratory specimens during the acute phase of infection. The lowest concentration of SARS-CoV-2 viral copies this assay can detect is 138 copies/mL. A negative result does not preclude SARS-Cov-2 infection and should not be used as the sole basis for treatment or other patient management decisions. A negative result may occur with  improper specimen collection/handling, submission of specimen other than nasopharyngeal swab, presence of viral mutation(s) within the areas targeted by this assay, and inadequate number of viral copies(<138 copies/mL). A negative result must be combined with clinical observations, patient history, and epidemiological information. The expected result is Negative.  Fact Sheet for Patients:  EntrepreneurPulse.com.au  Fact Sheet for Healthcare Providers:  IncredibleEmployment.be  This test is no t yet approved or cleared by the [place] FDA and  has been authorized for detection and/or diagnosis of SARS-CoV-2 by FDA under an Emergency Use Authorization (EUA). This EUA will remain  in effect (meaning this test can be used) for the duration of the COVID-19 declaration under Section 564(b)(1) of the Act, 21 U.S.C.section 360bbb-3(b)(1), unless the authorization is terminated  or revoked sooner.       Influenza A by PCR NEGATIVE NEGATIVE Final   Influenza B by PCR NEGATIVE NEGATIVE Final    Comment: (NOTE) The Xpert Xpress SARS-CoV-2/FLU/RSV plus assay is intended as an aid in the diagnosis of influenza from Nasopharyngeal swab specimens and should not be used as a sole basis for  treatment. Nasal washings and aspirates are unacceptable for Xpert Xpress SARS-CoV-2/FLU/RSV testing.  Fact Sheet for Patients: EntrepreneurPulse.com.au  Fact Sheet for Healthcare Providers: IncredibleEmployment.be  This test is not yet approved or cleared by the [place] FDA and has been authorized for detection and/or diagnosis of SARS-CoV-2 by FDA under an Emergency Use Authorization (EUA). This EUA will remain in effect (meaning this test can be used) for the duration of the COVID-19 declaration under Section 564(b)(1) of the Act, 21 U.S.C. section 360bbb-3(b)(1), unless the authorization is terminated or revoked.  Performed at [facility], [place] [address] [place], [place] [address]   Culture, blood (Routine X 2) w Reflex to ID Panel     Status: None   Collection Time: 11/28/21  6:10 AM   Specimen: BLOOD RIGHT HAND  Result Value Ref Range Status   Specimen Description BLOOD RIGHT HAND  Final   Special Requests   Final    BOTTLES DRAWN  AEROBIC AND ANAEROBIC Blood Culture adequate volume   Culture   Final    NO GROWTH 5 DAYS Performed at [facility], [place] [address] [place], [place] [address]    Report Status 12/03/2021 FINAL  Final  Culture, blood (Routine X 2) w Reflex to ID Panel     Status: None   Collection Time: 11/28/21  6:15 AM   Specimen: BLOOD LEFT HAND  Result Value Ref Range Status   Specimen Description BLOOD LEFT HAND  Final   Special Requests   Final    BOTTLES DRAWN AEROBIC AND ANAEROBIC Blood Culture results may not be optimal due to an inadequate volume of blood received in culture bottles   Culture   Final    NO GROWTH 5 DAYS Performed at [facility], [place] [address] [place], [place] [address]    Report Status 12/03/2021 FINAL  Final  Culture, Respiratory w Gram Stain     Status: None   Collection Time: 11/30/21  8:26 AM   Specimen: Tracheal Aspirate; Respiratory  Result Value Ref Range  Status   Specimen Description   Final    TRACHEAL ASPIRATE Performed at [place] [address], [place], [place] [address]    Special Requests   Final    NONE Performed at [facility], [place] [address], [place], [place] [address]    Gram Stain   Final    ABUNDANT WBC PRESENT, PREDOMINANTLY PMN FEW GRAM POSITIVE COCCI IN PAIRS    Culture   Final    RARE Normal respiratory flora-no Staph aureus or Pseudomonas seen Performed at [place] [address] [place], [place] [address]    Report Status 12/02/2021 FINAL  Final         Radiology Studies: No results found.      Scheduled Meds:  arformoterol  15 mcg Nebulization BID   aspirin  81 mg Oral Daily   atorvastatin  80 mg Oral QHS   budesonide (PULMICORT) nebulizer solution  0.5 mg Nebulization BID   Chlorhexidine Gluconate Cloth  6 each Topical Daily   enoxaparin (LOVENOX) injection  40 mg Subcutaneous W88Q   folic acid  1 mg Oral Daily   furosemide  40 mg Intravenous Daily   insulin aspart  0-20 Units Subcutaneous Q4H   losartan  50 mg Oral Daily   metoprolol succinate  50 mg Oral Daily   multivitamin with minerals  1 tablet Oral Daily   pantoprazole  40 mg Oral Daily   [START ON 12/06/2021] predniSONE  20 mg Oral Q breakfast   pregabalin  100 mg Oral BID   revefenacin  175 mcg Nebulization Daily   thiamine  100 mg Oral Daily   Continuous Infusions:  sodium chloride Stopped (11/29/21 0648)     LOS: 7 days    Time spent: 35 minutes.    Barb Merino, MD [facility] Pager [phone]

## 2021-12-05 NOTE — Progress Notes (Signed)
Mobility Specialist - Progress Note  Pre-mobility: 88 bpm HR, 95% SpO2 Post-mobility: 105 bpm HR, 96% SPO2   12/05/21 1411  Oxygen Therapy  O2 Device Nasal Cannula  O2 Flow Rate (L/min) 2 L/min  Mobility  Activity Ambulated with assistance in hallway  Level of Assistance Contact guard assist, steadying assist  Assistive Device Front wheel walker  Distance Ambulated (ft) 250 ft  Range of Motion/Exercises Active  Activity Response Tolerated well  Mobility Referral Yes  $Mobility charge 1 Mobility   Pt was found on recliner chair and agreeable to ambulate. Had a LOB when going from sitting to standing and sat back down on chair. Was able to get up the second time and stated feeling more stable. At EOS returned to recliner chair with necessities in reach and chair alarm on.  Ferd Hibbs Mobility Specialist

## 2021-12-05 NOTE — Evaluation (Signed)
Occupational Therapy Evaluation Patient Details Name: James [name] MRN: [id number] DOB: November 12, 1960 Today's Date: 12/05/2021   History of Present Illness 61 y.o. male  admitted on 11/28/2021 with acute respiratory failure secondary to COPD exacerbation and community acquired PNA requiring intubation. extubated 12/01/21. Cardiology consulted for elevated troponin. PMH: COPD on home O2, HTN, HLD, DM II and ETOH abuse   Clinical Impression   [name] is a 62 year old man who presents with above medical history and he is on hospital day 7. On OT evaluation patient demonstrates good upper body strength, ability to stand and ambulate in room without a device or loss of balance x 2 in room. He exhibits the functional ability to perform ADL tasks, stand for grooming and don socks. He reports ambulating in hall with mobility tech. Therapist monitored o2 sat in room and RA his o2 sat maintained 91-92% but sometimes pleth signal poor so redonned 2 LNC. From a functional standpoint patient doing well and does not need OT services.       Recommendations for follow up therapy are one component of a multi-disciplinary discharge planning process, led by the attending physician.  Recommendations may be updated based on patient status, additional functional criteria and insurance authorization.   Follow Up Recommendations  No OT follow up     Assistance Recommended at Discharge PRN  Patient can return home with the following Assistance with cooking/housework    Functional Status Assessment  Patient has had a recent decline in their functional status and demonstrates the ability to make significant improvements in function in a reasonable and predictable amount of time.  Equipment Recommendations  None recommended by OT    Recommendations for Other Services       Precautions / Restrictions Precautions Precautions: Fall Restrictions Weight Bearing Restrictions: No      Mobility Bed  Mobility               General bed mobility comments: up in chair    Transfers Overall transfer level: Needs assistance Equipment used: None               General transfer comment: Ambulated in room x 2 without a device. No overt loss of balance and unsteadiness.      Balance Overall balance assessment: Mild deficits observed, not formally tested                                         ADL either performed or assessed with clinical judgement   ADL Overall ADL's : Modified independent                                       General ADL Comments: No physical assistance needed for ADLs. Supervisoin for safety and assessment purposes.     Vision   Vision Assessment?: No apparent visual deficits     Perception     Praxis      Pertinent Vitals/Pain Pain Assessment Pain Assessment: No/denies pain     Hand Dominance Right   Extremity/Trunk Assessment Upper Extremity Assessment Upper Extremity Assessment: Overall WFL for tasks assessed   Lower Extremity Assessment Lower Extremity Assessment: Defer to PT evaluation   Cervical / Trunk Assessment Cervical / Trunk Assessment: Normal   Communication Communication Communication: No difficulties  Cognition Arousal/Alertness: Awake/alert Behavior During Therapy: WFL for tasks assessed/performed Overall Cognitive Status: Within Functional Limits for tasks assessed                                       General Comments       Exercises     Shoulder Instructions      Home Living Family/patient expects to be discharged to:: Private residence Living Arrangements: Non-relatives/Friends Available Help at Discharge: Friend(s);Available PRN/intermittently Type of Home: House Home Access: Stairs to enter   Entrance Stairs-Rails: None Home Layout: One level     Bathroom Shower/Tub: Teacher, early years/pre: Standard Bathroom Accessibility: Yes    Home Equipment: None   Additional Comments: On 2 L O2 at home - reports he only wears it at night      Prior Functioning/Environment Prior Level of Function : Independent/Modified Independent             Mobility Comments: no longer drives but goes out with help to get groceries; ADLs Comments: Pt reports he tub bathes, but has had a couple of falls getting out of tub, but has placed a mat in bottom of tub which has reduced falls.  he reports he is able to grocery shop mod I.        OT Problem List:        OT Treatment/Interventions:      OT Goals(Current goals can be found in the care plan section) Acute Rehab OT Goals OT Goal Formulation: All assessment and education complete, DC therapy  OT Frequency:      Co-evaluation              AM-PAC OT "6 Clicks" Daily Activity     Outcome Measure Help from another person eating meals?: None Help from another person taking care of personal grooming?: None Help from another person toileting, which includes using toliet, bedpan, or urinal?: None Help from another person bathing (including washing, rinsing, drying)?: None Help from another person to put on and taking off regular upper body clothing?: None Help from another person to put on and taking off regular lower body clothing?: None 6 Click Score: 24   End of Session Equipment Utilized During Treatment: Oxygen Nurse Communication: Mobility status  Activity Tolerance: Patient tolerated treatment well Patient left: in bed;with chair alarm set  OT Visit Diagnosis: Unsteadiness on feet (R26.81)                Time: 6440-3474 OT Time Calculation (min): 14 min Charges:  OT General Charges $OT Visit: 1 Visit OT Evaluation $OT Eval Low Complexity: 1 Low  Gustavo Lah, OTR/L [place]  Office [phone]   Lenward Chancellor 12/05/2021, 2:56 PM

## 2021-12-05 NOTE — Progress Notes (Signed)
Notified Dr. Edythe Clarity that pt has a worsening rash on BUE, groin, & buttock. Rn has been cleansing daily, covering groin & buttock with barrier cream & antifungal, but rash on BUE is new. Pt states he applies two RX creams at home. Noted to MD, names of two, which pt uses.

## 2021-12-05 NOTE — Progress Notes (Signed)
Rounding Note    Patient Name: James [name] Date of Encounter: 12/05/2021  [place] Cardiologist: Evalina Field, MD   Subjective   No CP  Breathing is ok   Does say the current inhalers do not work as well as Trellogy  Inpatient Medications    Scheduled Meds:  arformoterol  15 mcg Nebulization BID   aspirin  81 mg Oral Daily   atorvastatin  80 mg Oral QHS   budesonide (PULMICORT) nebulizer solution  0.5 mg Nebulization BID   Chlorhexidine Gluconate Cloth  6 each Topical Daily   enoxaparin (LOVENOX) injection  40 mg Subcutaneous E72C   folic acid  1 mg Oral Daily   furosemide  40 mg Intravenous Daily   insulin aspart  0-20 Units Subcutaneous Q4H   losartan  50 mg Oral Daily   methylPREDNISolone (SOLU-MEDROL) injection  40 mg Intravenous Q24H   metoprolol succinate  50 mg Oral Daily   multivitamin with minerals  1 tablet Oral Daily   pantoprazole  40 mg Oral Daily   pregabalin  100 mg Oral BID   revefenacin  175 mcg Nebulization Daily   thiamine  100 mg Oral Daily   Continuous Infusions:  sodium chloride Stopped (11/29/21 0648)   PRN Meds: acetaminophen (TYLENOL) oral liquid 160 mg/5 mL, albuterol, ondansetron (ZOFRAN) IV, mouth rinse   Vital Signs    Vitals:   12/04/21 2029 12/05/21 0415 12/05/21 0638 12/05/21 0641  BP:  (!) 104/51    Pulse:  76    Resp:  20    Temp:  98.4 F (36.9 C)    TempSrc:      SpO2: 96% 93% 95% 96%  Weight:      Height:        Intake/Output Summary (Last 24 hours) at 12/05/2021 0713 Last data filed at 12/04/2021 1725 Gross per 24 hour  Intake --  Output 1000 ml  Net -1000 ml      12/02/2021    3:50 AM 12/01/2021    5:00 AM 11/29/2021    5:00 AM  Last 3 Weights  Weight (lbs) 206 lb 5.6 oz 214 lb 1.1 oz 205 lb 7.5 oz  Weight (kg) 93.6 kg 97.1 kg 93.2 kg      Telemetry    NSR    ECG    No new  - Personally Reviewed  Physical Exam   GEN: No acute distress.   Neck: No JVD Cardiac: RRR, no  murmurs, Respiratory: Moving air  Mild rhonchi    Rales at L base  GI: Soft, nontender, non-distended  MS: No edema 2+ DP pusles  Neuro:  Nonfocal  Psych: Normal affect   Labs    High Sensitivity Troponin:   Recent Labs  Lab 11/28/21 0500 11/28/21 0739 11/28/21 1806 11/28/21 2013 11/29/21 0807  TROPONINIHS 20* 467* 1,676* 1,951* 1,296*     Chemistry Recent Labs  Lab 12/03/21 0300 12/04/21 0506 12/05/21 0536  NA 142 142 139  K 3.9 4.1 4.0  CL 102 102 100  CO2 30 31 30   GLUCOSE 118* 90 102*  BUN 35* 36* 48*  CREATININE 0.67 0.68 0.84  CALCIUM 9.3 10.1 9.5  MG 2.3 2.3 2.2  GFRNONAA >60 >60 >60  ANIONGAP 10 9 9     Lipids  Recent Labs  Lab 11/29/21 0500  TRIG 85    Hematology Recent Labs  Lab 12/03/21 0300 12/04/21 0506 12/05/21 0536  WBC 12.6* 15.8* 14.5*  RBC 4.30 4.78  4.96  HGB 12.4* 13.8 14.3  HCT 40.5 43.6 44.6  MCV 94.2 91.2 89.9  MCH 28.8 28.9 28.8  MCHC 30.6 31.7 32.1  RDW 14.5 13.8 13.6  PLT 284 335 339   Thyroid No results for input(s): "TSH", "FREET4" in the last 168 hours.  BNP No results for input(s): "BNP", "PROBNP" in the last 168 hours.   DDimer No results for input(s): "DDIMER" in the last 168 hours.   Radiology    No results found.  Cardiac Studies   Echo 11/29/2021  1. Left ventricular ejection fraction, by estimation, is 50 to 55%. The  left ventricle has low normal function. The left ventricle has no regional  wall motion abnormalities. Left ventricular diastolic parameters are  consistent with Grade I diastolic  dysfunction (impaired relaxation).   2. Right ventricular systolic function is normal. The right ventricular  size is normal. Tricuspid regurgitation signal is inadequate for assessing  PA pressure.   3. The mitral valve is normal in structure. No evidence of mitral valve  regurgitation.   4. The aortic valve is grossly normal. Aortic valve regurgitation is not  visualized. No aortic stenosis is present.   5.  The inferior vena cava is normal in size with greater than 50%  respiratory variability, suggesting right atrial pressure of 3 mmHg.   Comparison(s): Prior images reviewed side by side. The left ventricular  systolic function is worsened. but at the lower limit of normal.   Patient Profile     61 y.o. male with severe COPD on home O2, HTN, HLD, DM II and EtOH abuse admitted on 11/28/2021 with acute respiratory failure secondary to COPD exacerbation and community acquired PNA requiring intubation. Cardiology consulted for elevated troponin   Assessment & Plan    Elevated troponin - in the setting of acute on chronic hypoxic respiratory failure secondary to COPD exacerbation, rhinovirus and CAP requiring intubation - Echo in 10/2019 showed EF 60-65%, repeat echo 11/26 showed EF 50-55% - has coronary artery calcification seen on previous CT image.  - serial trop 20-->467-->1676-->1951-->1296. New diffuse TWI noted  New since admit     Given new T wave inversion I would recomm evaluation prior to discharge    Reviewed  with pt    Will repeat  EKG    Possible LHC on Monday    Probable SVT: had significant hypotension on IV cardizem. No atrial fibrillation. Conservative management, BP improved, on low dose toprol XL   Follow on tele   Hypotension: improved, history of hypertension.  Follow    Acute respiratory failure: intubated 11/25, extubated on 12/01/2021. Breathing continues to improve      For questions or updates, please contact [place] Please consult [email or web address] for contact info under        Signed, Dorris Carnes, MD  12/05/2021, 7:13 AM

## 2021-12-06 DIAGNOSIS — J9602 Acute respiratory failure with hypercapnia: Secondary | ICD-10-CM | POA: Diagnosis not present

## 2021-12-06 DIAGNOSIS — I10 Essential (primary) hypertension: Secondary | ICD-10-CM | POA: Diagnosis not present

## 2021-12-06 DIAGNOSIS — I214 Non-ST elevation (NSTEMI) myocardial infarction: Secondary | ICD-10-CM | POA: Diagnosis not present

## 2021-12-06 DIAGNOSIS — J9601 Acute respiratory failure with hypoxia: Secondary | ICD-10-CM | POA: Diagnosis not present

## 2021-12-06 DIAGNOSIS — J441 Chronic obstructive pulmonary disease with (acute) exacerbation: Secondary | ICD-10-CM | POA: Diagnosis not present

## 2021-12-06 DIAGNOSIS — J9621 Acute and chronic respiratory failure with hypoxia: Secondary | ICD-10-CM | POA: Diagnosis not present

## 2021-12-06 DIAGNOSIS — F79 Unspecified intellectual disabilities: Secondary | ICD-10-CM | POA: Diagnosis not present

## 2021-12-06 LAB — BASIC METABOLIC PANEL
Anion gap: 9 (ref 5–15)
BUN: 46 mg/dL — ABNORMAL HIGH (ref 6–20)
CO2: 28 mmol/L (ref 22–32)
Calcium: 8.8 mg/dL — ABNORMAL LOW (ref 8.9–10.3)
Chloride: 100 mmol/L (ref 98–111)
Creatinine, Ser: 0.89 mg/dL (ref 0.61–1.24)
GFR, Estimated: >60 mL/min (ref >60)
Glucose, Bld: 89 mg/dL (ref 70–99)
Potassium: 3.8 mmol/L (ref 3.5–5.1)
Sodium: 137 mmol/L (ref 135–145)

## 2021-12-06 LAB — GLUCOSE, CAPILLARY
Glucose-Capillary: 109 mg/dL — ABNORMAL HIGH (ref 70–99)
Glucose-Capillary: 125 mg/dL — ABNORMAL HIGH (ref 70–99)
Glucose-Capillary: 168 mg/dL — ABNORMAL HIGH (ref 70–99)
Glucose-Capillary: 198 mg/dL — ABNORMAL HIGH (ref 70–99)
Glucose-Capillary: 83 mg/dL (ref 70–99)
Glucose-Capillary: 90 mg/dL (ref 70–99)

## 2021-12-06 LAB — PHOSPHORUS: Phosphorus: 3.4 mg/dL (ref 2.5–4.6)

## 2021-12-06 LAB — CBC
HCT: 40.9 % (ref 39.0–52.0)
Hemoglobin: 13.3 g/dL (ref 13.0–17.0)
MCH: 29 pg (ref 26.0–34.0)
MCHC: 32.5 g/dL (ref 30.0–36.0)
MCV: 89.3 fL (ref 80.0–100.0)
Platelets: 320 10*3/uL (ref 150–400)
RBC: 4.58 MIL/uL (ref 4.22–5.81)
RDW: 13.4 % (ref 11.5–15.5)
WBC: 13.1 10*3/uL — ABNORMAL HIGH (ref 4.0–10.5)
nRBC: 0 % (ref 0.0–0.2)

## 2021-12-06 LAB — MAGNESIUM: Magnesium: 2.1 mg/dL (ref 1.7–2.4)

## 2021-12-06 MED ORDER — NYSTATIN 100000 UNIT/GM EX CREA
TOPICAL_CREAM | Freq: Three times a day (TID) | CUTANEOUS | Status: DC
  Administered 2021-12-07: 1 via TOPICAL
  Filled 2021-12-06 (×2): qty 30

## 2021-12-06 MED ORDER — SODIUM CHLORIDE 0.9% FLUSH
3.0000 mL | Freq: Two times a day (BID) | INTRAVENOUS | Status: DC
  Administered 2021-12-06: 3 mL via INTRAVENOUS

## 2021-12-06 MED ORDER — SODIUM CHLORIDE 0.9 % WEIGHT BASED INFUSION
1.0000 mL/kg/h | INTRAVENOUS | Status: DC
  Administered 2021-12-07: 1 mL/kg/h via INTRAVENOUS

## 2021-12-06 MED ORDER — SODIUM CHLORIDE 0.9% FLUSH: 3.0000 mL | INTRAVENOUS | Status: DC | PRN

## 2021-12-06 MED ORDER — NYSTATIN 100000 UNIT/GM EX POWD
Freq: Three times a day (TID) | CUTANEOUS | Status: DC
  Administered 2021-12-07: 1 via TOPICAL
  Filled 2021-12-06: qty 15

## 2021-12-06 MED ORDER — SODIUM CHLORIDE 0.9 % WEIGHT BASED INFUSION
3.0000 mL/kg/h | INTRAVENOUS | Status: DC
  Administered 2021-12-07: 3 mL/kg/h via INTRAVENOUS

## 2021-12-06 MED ORDER — ASPIRIN 81 MG PO CHEW
81.0000 mg | CHEWABLE_TABLET | ORAL | Status: AC
  Administered 2021-12-07: 81 mg via ORAL
  Filled 2021-12-06: qty 1

## 2021-12-06 MED ORDER — SODIUM CHLORIDE 0.9 % IV SOLN: 250.0000 mL | INTRAVENOUS | Status: DC | PRN

## 2021-12-06 MED ORDER — FUROSEMIDE 40 MG PO TABS
40.0000 mg | ORAL_TABLET | Freq: Every day | ORAL | Status: DC
  Administered 2021-12-07 – 2021-12-08 (×2): 40 mg via ORAL
  Filled 2021-12-06 (×2): qty 1

## 2021-12-06 NOTE — H&P (View-Only) (Signed)
LHC planned tomorrow per Dr Harrington Challenger request, orders placed, NPO after midnight

## 2021-12-06 NOTE — Hospital Course (Signed)
61 year old gentleman from home, has history of COPD and chronic hypoxemic respiratory failure on 2 L oxygen at home.  He does have a history of type 2 diabetes, hypertension.  He was found with severe COPD exacerbation secondary to rhinovirus infection, intubated and admitted to ICU.  Also found to have SVT , not Afib 1/25 Admit, intubated, started ABx, start cardizem gtt-->got hypotensive. Converted to SR but started on Neo.  11/26 Overnight transferred from [facility] ED to [facility] ICU 11/27 Remains intubated on Prop/Versed for sedation; increased secretions with high peak pressures on vent, Resp Cx sent. 11/28 Resp Cx with few gram + cocci in pairs. FiO2 needs down to 50%. Secretions improved, but ongoing breath stacking. Sedation to Precedex. Tolerating PSV. Extubated. Hypertensive, started on cleviprex + precedex Cardiology following and diastolic CHF. Ischemic evaluation with left heart cath planned for 12/4.

## 2021-12-06 NOTE — Progress Notes (Signed)
LHC planned tomorrow per Dr Harrington Challenger request, orders placed, NPO after midnight

## 2021-12-06 NOTE — Progress Notes (Signed)
Rounding Note    Patient Name: James [name] Date of Encounter: 12/06/2021  [place] Cardiologist: Evalina Field, MD   Subjective   No CP  Breathing is OK   Inpatient Medications    Scheduled Meds:  arformoterol  15 mcg Nebulization BID   aspirin  81 mg Oral Daily   atorvastatin  80 mg Oral QHS   budesonide (PULMICORT) nebulizer solution  0.5 mg Nebulization BID   Chlorhexidine Gluconate Cloth  6 each Topical Daily   enoxaparin (LOVENOX) injection  40 mg Subcutaneous Q46N   folic acid  1 mg Oral Daily   furosemide  40 mg Intravenous Daily   insulin aspart  0-20 Units Subcutaneous Q4H   losartan  50 mg Oral Daily   metoprolol succinate  50 mg Oral Daily   multivitamin with minerals  1 tablet Oral Daily   pantoprazole  40 mg Oral Daily   predniSONE  20 mg Oral Q breakfast   predniSONE  20 mg Oral Q breakfast   pregabalin  100 mg Oral BID   revefenacin  175 mcg Nebulization Daily   thiamine  100 mg Oral Daily   Continuous Infusions:  sodium chloride Stopped (11/29/21 0648)   PRN Meds: acetaminophen (TYLENOL) oral liquid 160 mg/5 mL, albuterol, ondansetron (ZOFRAN) IV, mouth rinse   Vital Signs    Vitals:   12/05/21 1844 12/05/21 2002 12/05/21 2036 12/06/21 0201  BP: 103/66 110/66  110/68  Pulse: 76 73  69  Resp: (!) 24 20  20   Temp: 97.8 F (36.6 C) 98 F (36.7 C)  97.8 F (36.6 C)  TempSrc: Oral   Oral  SpO2: 95% 95% 90% 96%  Weight:      Height:        Intake/Output Summary (Last 24 hours) at 12/06/2021 0734 Last data filed at 12/06/2021 0700 Gross per 24 hour  Intake --  Output 2425 ml  Net -2425 ml      12/02/2021    3:50 AM 12/01/2021    5:00 AM 11/29/2021    5:00 AM  Last 3 Weights  Weight (lbs) 206 lb 5.6 oz 214 lb 1.1 oz 205 lb 7.5 oz  Weight (kg) 93.6 kg 97.1 kg 93.2 kg      Telemetry    NSR    ECG    EKG yesterday  Unable to see  - Personally Reviewed  Physical Exam   GEN: No acute distress.   Neck: No  JVD Cardiac: RRR, no murmurs Respiratory: CTA  GI: Soft, nontender, non-distended  MS: No edema 2+ DP pusles  Neuro:  Nonfocal  Psych: Normal affect   Labs    High Sensitivity Troponin:   Recent Labs  Lab 11/28/21 0500 11/28/21 0739 11/28/21 1806 11/28/21 2013 11/29/21 0807  TROPONINIHS 20* 467* 1,676* 1,951* 1,296*     Chemistry Recent Labs  Lab 12/04/21 0506 12/05/21 0536 12/06/21 0501  NA 142 139 137  K 4.1 4.0 3.8  CL 102 100 100  CO2 31 30 28   GLUCOSE 90 102* 89  BUN 36* 48* 46*  CREATININE 0.68 0.84 0.89  CALCIUM 10.1 9.5 8.8*  MG 2.3 2.2 2.1  GFRNONAA >60 >60 >60  ANIONGAP 9 9 9     Lipids  No results for input(s): "CHOL", "TRIG", "HDL", "LABVLDL", "LDLCALC", "CHOLHDL" in the last 168 hours.   Hematology Recent Labs  Lab 12/04/21 0506 12/05/21 0536 12/06/21 0501  WBC 15.8* 14.5* 13.1*  RBC 4.78 4.96 4.58  HGB 13.8 14.3 13.3  HCT 43.6 44.6 40.9  MCV 91.2 89.9 89.3  MCH 28.9 28.8 29.0  MCHC 31.7 32.1 32.5  RDW 13.8 13.6 13.4  PLT 335 339 320   Thyroid No results for input(s): "TSH", "FREET4" in the last 168 hours.  BNP No results for input(s): "BNP", "PROBNP" in the last 168 hours.   DDimer No results for input(s): "DDIMER" in the last 168 hours.   Radiology    No results found.  Cardiac Studies   Echo 11/29/2021  1. Left ventricular ejection fraction, by estimation, is 50 to 55%. The  left ventricle has low normal function. The left ventricle has no regional  wall motion abnormalities. Left ventricular diastolic parameters are  consistent with Grade I diastolic  dysfunction (impaired relaxation).   2. Right ventricular systolic function is normal. The right ventricular  size is normal. Tricuspid regurgitation signal is inadequate for assessing  PA pressure.   3. The mitral valve is normal in structure. No evidence of mitral valve  regurgitation.   4. The aortic valve is grossly normal. Aortic valve regurgitation is not  visualized.  No aortic stenosis is present.   5. The inferior vena cava is normal in size with greater than 50%  respiratory variability, suggesting right atrial pressure of 3 mmHg.   Comparison(s): Prior images reviewed side by side. The left ventricular  systolic function is worsened. but at the lower limit of normal.   Patient Profile     61 y.o. male with severe COPD on home O2, HTN, HLD, DM II and EtOH abuse admitted on 11/28/2021 with acute respiratory failure secondary to COPD exacerbation and community acquired PNA requiring intubation. Cardiology consulted for elevated troponin   Assessment & Plan    Elevated troponin - in the setting of acute on chronic hypoxic respiratory failure secondary to COPD exacerbation, rhinovirus and CAP requiring intubation - Echo in 10/2019 showed EF 60-65%, repeat echo 11/26 showed EF 50-55% - has coronary artery calcification seen on previous CT image.  - serial trop 20-->467-->1676-->1951-->1296. New diffuse TWI noted  New since admit     Given new T wave inversion I would recomm evaluation prior to discharge    Reviewed  with pt     Would recomm L heart cath    Risk / benefits described  Pt understands and agrees to proceed    Note   Pt had been on IV lasix   I switched to PO for now   Will get LVEDP at cath  Probable SVT: had significant hypotension Rx IV cardiazem earlier    Currently on toprol XL   No recurrence.   Follow on tele   Hypotension: improved, history of hypertension.  Follow   Lipids   On statin   Will check in AM  Acute respiratory failure: intubated 11/25, extubated on 12/01/2021.  On 2L   Wears at home      For questions or updates, please contact [place] Please consult [email or web address] for contact info under        Signed, Dorris Carnes, MD  12/06/2021, 7:34 AM

## 2021-12-06 NOTE — Progress Notes (Signed)
  Progress Note   [name] UEA:[id number] DOB: November 14, 1960 DOA: 11/28/2021     8 DOS: the patient was seen and examined on 12/06/2021   Brief hospital course: 61 year old gentleman from home, has history of COPD and chronic hypoxemic respiratory failure on 2 L oxygen at home.  He does have a history of type 2 diabetes, hypertension.  He was found with severe COPD exacerbation secondary to rhinovirus infection, intubated and admitted to ICU.  Also found to have SVT , not Afib 1/25 Admit, intubated, started ABx, start cardizem gtt-->got hypotensive. Converted to SR but started on Neo.  11/26 Overnight transferred from [facility] ED to [facility] ICU 11/27 Remains intubated on Prop/Versed for sedation; increased secretions with high peak pressures on vent, Resp Cx sent. 11/28 Resp Cx with few gram + cocci in pairs. FiO2 needs down to 50%. Secretions improved, but ongoing breath stacking. Sedation to Precedex. Tolerating PSV. Extubated. Hypertensive, started on cleviprex + precedex Cardiology following and diastolic CHF. Ischemic evaluation with left heart cath planned for 12/4.  Assessment and Plan: Acute on chronic hypoxemic and hypercapnic respiratory failure: Acute COPD exacerbation due to rhinovirus infection as well as superimposed bacterial pneumonia: On baseline 2 L oxygen at home.  Currently extubated to 2 L of oxygen.  Clinically stabilizing. Continue Brovana, Pulmicort and Yupelri.  On as needed bronchodilator therapy. Solu-Medrol, tapering off.  Now on prednisone Completed broad spec abx   Essential hypertension with hypertensive emergency: Initially hypotensive after intubation on vasopressors.  Then hypertensive.  Patient was on Cleviprex infusion. Currently blood pressure stabilized on blood pressure and Cozaar.  Also on IV Lasix.   Recurrent SVT:  Echo with ejection fraction 50 to 55%.  Cardiology following.  Currently in sinus rhythm.  On metoprolol, aspirin.  Improved.   Type 2  diabetes poor control, steroid-induced hyperglycemia: Fairly stable.  Not on treatment at home.   Elevated troponins with T wave depression: Respiratory status improving.  Cardiology planning for left heart cath 12/4.   Clinically improving.   -Continue to work with PT OT.   -PT recommended SNF but patient reportedly felt like he can go home.   -will f/u post cath  Rash -pruritic rash in gluteal region, groin, and also over L forarm (areas pt is scratching) -Rash appears fungal -start topical antifungal      Subjective: complaining of itchy rash in gluteal region and in groin  Physical Exam: Vitals:   12/06/21 0953 12/06/21 1000 12/06/21 1010 12/06/21 1433  BP: 118/61   94/60  Pulse: (!) 54   76  Resp:  (!) 24 (!) 22 20  Temp: 98.1 F (36.7 C)   98 F (36.7 C)  TempSrc: Oral   Oral  SpO2: 93%   91%  Weight:      Height:       General exam: Awake, laying in bed, in nad Respiratory system: Normal respiratory effort, no wheezing Cardiovascular system: regular rate, s1, s2 Gastrointestinal system: Soft, nondistended, positive BS Central nervous system: CN2-12 grossly intact, strength intact Extremities: Perfused, no clubbing Skin: Normal skin turgor, no notable skin lesi Psychiatry: Mood normal // no visual hallucinations   Data Reviewed:  Labs reviewed: Na 137, K 3.8, Cr 0.89  Family Communication: Pt in room, family not at bedside  Disposition: Status is: Inpatient Remains inpatient appropriate because: Severity of illness  Planned Discharge Destination: Home    Author: Marylu Lund, MD 12/06/2021 3:41 PM  For on call review [email or web address].

## 2021-12-07 ENCOUNTER — Encounter (HOSPITAL_COMMUNITY): Payer: Self-pay | Admitting: Cardiovascular Disease

## 2021-12-07 ENCOUNTER — Encounter (HOSPITAL_COMMUNITY)
Admission: EM | Disposition: A | Discharge status: Home / Self Care | Payer: Self-pay | Source: Home / Self Care | Attending: Internal Medicine

## 2021-12-07 DIAGNOSIS — I10 Essential (primary) hypertension: Secondary | ICD-10-CM | POA: Diagnosis not present

## 2021-12-07 DIAGNOSIS — J9601 Acute respiratory failure with hypoxia: Secondary | ICD-10-CM | POA: Diagnosis not present

## 2021-12-07 DIAGNOSIS — I214 Non-ST elevation (NSTEMI) myocardial infarction: Secondary | ICD-10-CM | POA: Diagnosis not present

## 2021-12-07 DIAGNOSIS — J441 Chronic obstructive pulmonary disease with (acute) exacerbation: Secondary | ICD-10-CM | POA: Diagnosis not present

## 2021-12-07 DIAGNOSIS — J9602 Acute respiratory failure with hypercapnia: Secondary | ICD-10-CM | POA: Diagnosis not present

## 2021-12-07 HISTORY — PX: LEFT HEART CATH AND CORONARY ANGIOGRAPHY: CATH118249

## 2021-12-07 LAB — COMPREHENSIVE METABOLIC PANEL WITH GFR
ALT: 32 U/L (ref 0–44)
AST: 22 U/L (ref 15–41)
Albumin: 3.3 g/dL — ABNORMAL LOW (ref 3.5–5.0)
Alkaline Phosphatase: 43 U/L (ref 38–126)
Anion gap: 10 (ref 5–15)
BUN: 49 mg/dL — ABNORMAL HIGH (ref 6–20)
CO2: 29 mmol/L (ref 22–32)
Calcium: 8.7 mg/dL — ABNORMAL LOW (ref 8.9–10.3)
Chloride: 97 mmol/L — ABNORMAL LOW (ref 98–111)
Creatinine, Ser: 1.12 mg/dL (ref 0.61–1.24)
GFR, Estimated: >60 mL/min
Glucose, Bld: 124 mg/dL — ABNORMAL HIGH (ref 70–99)
Potassium: 3.5 mmol/L (ref 3.5–5.1)
Sodium: 136 mmol/L (ref 135–145)
Total Bilirubin: 0.5 mg/dL (ref 0.3–1.2)
Total Protein: 6.2 g/dL — ABNORMAL LOW (ref 6.5–8.1)

## 2021-12-07 LAB — GLUCOSE, CAPILLARY
Glucose-Capillary: 111 mg/dL — ABNORMAL HIGH (ref 70–99)
Glucose-Capillary: 113 mg/dL — ABNORMAL HIGH (ref 70–99)
Glucose-Capillary: 128 mg/dL — ABNORMAL HIGH (ref 70–99)
Glucose-Capillary: 130 mg/dL — ABNORMAL HIGH (ref 70–99)
Glucose-Capillary: 74 mg/dL (ref 70–99)
Glucose-Capillary: 76 mg/dL (ref 70–99)
Glucose-Capillary: 84 mg/dL (ref 70–99)

## 2021-12-07 LAB — CBC
HCT: 41.8 % (ref 39.0–52.0)
Hemoglobin: 13.3 g/dL (ref 13.0–17.0)
MCH: 28.7 pg (ref 26.0–34.0)
MCHC: 31.8 g/dL (ref 30.0–36.0)
MCV: 90.1 fL (ref 80.0–100.0)
Platelets: 328 10*3/uL (ref 150–400)
RBC: 4.64 MIL/uL (ref 4.22–5.81)
RDW: 13.5 % (ref 11.5–15.5)
WBC: 15.4 10*3/uL — ABNORMAL HIGH (ref 4.0–10.5)
nRBC: 0 % (ref 0.0–0.2)

## 2021-12-07 SURGERY — LEFT HEART CATH AND CORONARY ANGIOGRAPHY
Anesthesia: LOCAL

## 2021-12-07 MED ORDER — SODIUM CHLORIDE 0.9 % IV SOLN: INTRAVENOUS | Status: AC

## 2021-12-07 MED ORDER — LABETALOL HCL 5 MG/ML IV SOLN: 10.0000 mg | INTRAVENOUS | Status: AC | PRN

## 2021-12-07 MED ORDER — VERAPAMIL HCL 2.5 MG/ML IV SOLN
INTRA_ARTERIAL | Status: DC | PRN
  Administered 2021-12-07: 15 mL via INTRA_ARTERIAL

## 2021-12-07 MED ORDER — ASPIRIN 81 MG PO CHEW: 81.0000 mg | CHEWABLE_TABLET | Freq: Every day | ORAL | Status: DC

## 2021-12-07 MED ORDER — ATORVASTATIN CALCIUM 20 MG PO TABS: 80.0000 mg | ORAL_TABLET | Freq: Every day | ORAL | Status: DC

## 2021-12-07 MED ORDER — ACETAMINOPHEN 325 MG PO TABS: 650.0000 mg | ORAL_TABLET | ORAL | Status: DC | PRN

## 2021-12-07 MED ORDER — HYDRALAZINE HCL 20 MG/ML IJ SOLN: 10.0000 mg | INTRAMUSCULAR | Status: AC | PRN

## 2021-12-07 MED ORDER — SODIUM CHLORIDE 0.9 % IV SOLN: 250.0000 mL | INTRAVENOUS | Status: DC | PRN

## 2021-12-07 MED ORDER — HEPARIN (PORCINE) IN NACL 1000-0.9 UT/500ML-% IV SOLN
INTRAVENOUS | Status: DC | PRN
  Administered 2021-12-07 (×2): 500 mL

## 2021-12-07 MED ORDER — FENTANYL CITRATE (PF) 100 MCG/2ML IJ SOLN
INTRAMUSCULAR | Status: DC | PRN
  Administered 2021-12-07: 25 ug via INTRAVENOUS

## 2021-12-07 MED ORDER — ONDANSETRON HCL 4 MG/2ML IJ SOLN: 4.0000 mg | Freq: Four times a day (QID) | INTRAMUSCULAR | Status: DC | PRN

## 2021-12-07 MED ORDER — HEPARIN SODIUM (PORCINE) 1000 UNIT/ML IJ SOLN
INTRAMUSCULAR | Status: DC | PRN
  Administered 2021-12-07: 4500 [IU] via INTRAVENOUS

## 2021-12-07 MED ORDER — LIDOCAINE HCL (PF) 1 % IJ SOLN
INTRAMUSCULAR | Status: DC | PRN
  Administered 2021-12-07: 2 mL

## 2021-12-07 MED ORDER — SODIUM CHLORIDE 0.9% FLUSH
3.0000 mL | Freq: Two times a day (BID) | INTRAVENOUS | Status: DC
  Administered 2021-12-07 – 2021-12-08 (×2): 3 mL via INTRAVENOUS

## 2021-12-07 MED ORDER — IOHEXOL 350 MG/ML SOLN
INTRAVENOUS | Status: DC | PRN
  Administered 2021-12-07: 50 mL via INTRA_ARTERIAL

## 2021-12-07 MED ORDER — MIDAZOLAM HCL 2 MG/2ML IJ SOLN
INTRAMUSCULAR | Status: DC | PRN
  Administered 2021-12-07: .5 mg via INTRAVENOUS

## 2021-12-07 MED ORDER — SODIUM CHLORIDE 0.9% FLUSH: 3.0000 mL | INTRAVENOUS | Status: DC | PRN

## 2021-12-07 MED ORDER — MORPHINE SULFATE (PF) 2 MG/ML IV SOLN: 2.0000 mg | INTRAVENOUS | Status: DC | PRN

## 2021-12-07 SURGICAL SUPPLY — 11 items
CATH INFINITI JR4 5F (CATHETERS) IMPLANT
CATH OPTITORQUE TIG 4.0 5F (CATHETERS) IMPLANT
DEVICE RAD COMP TR BAND LRG (VASCULAR PRODUCTS) IMPLANT
GLIDESHEATH SLEND A-KIT 6F 22G (SHEATH) IMPLANT
GUIDEWIRE INQWIRE 1.5J.035X260 (WIRE) IMPLANT
INQWIRE 1.5J .035X260CM (WIRE) ×1
KIT HEART LEFT (KITS) ×1 IMPLANT
PACK CARDIAC CATHETERIZATION (CUSTOM PROCEDURE TRAY) ×1 IMPLANT
TRANSDUCER W/STOPCOCK (MISCELLANEOUS) ×1 IMPLANT
TUBING CIL FLEX 10 FLL-RA (TUBING) ×1 IMPLANT
WIRE HI TORQ VERSACORE-J 145CM (WIRE) IMPLANT

## 2021-12-07 NOTE — Progress Notes (Signed)
PT Cancellation Note  Patient Details Name: James [name] MRN: [id number] DOB: Jun 29, 1960   Cancelled Treatment:    Reason Eval/Treat Not Completed: Patient at procedure or test/unavailable   Kati L Payson 12/07/2021, 9:03 AM

## 2021-12-07 NOTE — Progress Notes (Signed)
Pt returned to unit from [facility]. He is a&O x 4, & able to sit up in chair. O2 remains on 2 L [place], per home dose, & doing well. Cath lab reported pt having the TR band in right radial with initial 10 ml of pressure, which was completely removed prior to transport back. He was given Versed 0.5 mg IV, Fentanyl 25 mg IV, & Heparin 4,500 units. No stents were placed.  Pt ordered supper after glucose, & RN gave medications with applesauce & coffee with sugar to help maintain glucose, after CBG resulted @ 74, & pt felt slightly foggy. Pt improved throughout remaining shift with no s/s of any distress, & TR sight remained CDI with not evidence of bleed.

## 2021-12-07 NOTE — Progress Notes (Signed)
Progress Note   [name] KDT:[id number] DOB: 1960-04-18 DOA: 11/28/2021     9 DOS: the patient was seen and examined on 12/07/2021   Brief hospital course: 61 year old gentleman from home, has history of COPD and chronic hypoxemic respiratory failure on 2 L oxygen at home.  He does have a history of type 2 diabetes, hypertension.  He was found with severe COPD exacerbation secondary to rhinovirus infection, intubated and admitted to ICU.  Also found to have SVT , not Afib 1/25 Admit, intubated, started ABx, start cardizem gtt-->got hypotensive. Converted to SR but started on Neo.  11/26 Overnight transferred from [facility] ED to [facility] ICU 11/27 Remains intubated on Prop/Versed for sedation; increased secretions with high peak pressures on vent, Resp Cx sent. 11/28 Resp Cx with few gram + cocci in pairs. FiO2 needs down to 50%. Secretions improved, but ongoing breath stacking. Sedation to Precedex. Tolerating PSV. Extubated. Hypertensive, started on cleviprex + precedex Cardiology following and diastolic CHF. Ischemic evaluation with left heart cath planned for 12/4.  Assessment and Plan: Acute on chronic hypoxemic and hypercapnic respiratory failure: Acute COPD exacerbation due to rhinovirus infection as well as superimposed bacterial pneumonia: On baseline 2 L oxygen at home.  Currently extubated to 2 L of oxygen.  Clinically stabilizing. Continue Brovana, Pulmicort and Yupelri.  On as needed bronchodilator therapy. Solu-Medrol, tapering off.  Now on prednisone Completed broad spec abx   Essential hypertension with hypertensive emergency: Initially hypotensive after intubation on vasopressors.  Then hypertensive.  Patient was on Cleviprex infusion. Currently blood pressure stabilized on blood pressure and Cozaar.  Also on IV Lasix.   Recurrent SVT:  Echo with ejection fraction 50 to 55%.  Cardiology following.  Currently in sinus rhythm.  On metoprolol, aspirin.  Improved.   Type 2  diabetes poor control, steroid-induced hyperglycemia: Fairly stable.  Not on treatment at home.   Elevated troponins with T wave depression: Respiratory status improving.  Cardiology had been following. Underwent clear heart cath 12/4. Cardiology recs for toprol xl 50mg . No need for ASA. Cardiology to f/u in 4 weeks   Clinically improving.   -Continue to work with PT OT.   -PT recommended SNF but patient reportedly felt like he can go home.   -Eager to go home  Rash -pruritic rash in gluteal region, groin, and also over L forarm (areas pt is scratching) -Rash appears fungal -continue topical antifungal      Subjective: seen post-cath. Pt without chest pain. Reports feeling at baseline. Eager to go home tomorrow  Physical Exam: Vitals:   12/07/21 1055 12/07/21 1100 12/07/21 1423 12/07/21 1430  BP: 114/67 (!) 92/48 124/80   Pulse: 63 69 (!) 44   Resp: 20 19 20 18   Temp:   98.6 F (37 C)   TempSrc:   Oral   SpO2: 96% 95% 94%   Weight:      Height:       General exam: Conversant, in no acute distress Respiratory system: normal chest rise, clear, no audible wheezing Cardiovascular system: regular rhythm, s1-s2 Gastrointestinal system: Nondistended, nontender, pos BS Central nervous system: No seizures, no tremors Extremities: No cyanosis, no joint deformities Skin: No rashes, no pallor Psychiatry: Affect normal // no auditory hallucinations   Data Reviewed:  Labs reviewed: Na 136, K 3.5, Cr 1.12  Family Communication: Pt in room, family not at bedside  Disposition: Status is: Inpatient Remains inpatient appropriate because: Severity of illness  Planned Discharge Destination: Home  Author: Marylu Lund, MD 12/07/2021 6:16 PM  For on call review [email or web address].

## 2021-12-07 NOTE — Progress Notes (Signed)
2D echo showed normal LVEDP and normal coronary arteries.  No further SVT.  No need for aspirin from a cardiac standpoint.  Continue Toprol-XL 50 mg daily for suppression of SVT.  No further cardiac workup.  Will sign off.  Follow-up with Dr. Audie Box in 4 weeks.

## 2021-12-07 NOTE — Interval H&P Note (Signed)
Cath Lab Visit (complete for each Cath Lab visit)  Clinical Evaluation Leading to the Procedure:   ACS: Yes.    Non-ACS:    Anginal Classification: No Symptoms  Anti-ischemic medical therapy: Minimal Therapy (1 class of medications)  Non-Invasive Test Results: No non-invasive testing performed  Prior CABG: No previous CABG      History and Physical Interval Note:  12/07/2021 8:43 AM  James [name]  has presented today for surgery, with the diagnosis of NSTEMI.  The various methods of treatment have been discussed with the patient and family. After consideration of risks, benefits and other options for treatment, the patient has consented to  Procedure(s): LEFT HEART CATH AND CORONARY ANGIOGRAPHY (N/A) as a surgical intervention.  The patient's history has been reviewed, patient examined, no change in status, stable for surgery.  I have reviewed the patient's chart and labs.  Questions were answered to the patient's satisfaction.     Quay Burow

## 2021-12-08 ENCOUNTER — Inpatient Hospital Stay
Admission: RE | Admit: 2021-12-08 | Discharge: 2021-12-08 | Disposition: A | Discharge status: Home / Self Care | Payer: Medicaid Other | Source: Ambulatory Visit | Attending: Pulmonary Disease | Admitting: Pulmonary Disease

## 2021-12-08 ENCOUNTER — Other Ambulatory Visit: Payer: Self-pay

## 2021-12-08 DIAGNOSIS — R7989 Other specified abnormal findings of blood chemistry: Secondary | ICD-10-CM | POA: Diagnosis not present

## 2021-12-08 DIAGNOSIS — I10 Essential (primary) hypertension: Secondary | ICD-10-CM | POA: Diagnosis not present

## 2021-12-08 DIAGNOSIS — J441 Chronic obstructive pulmonary disease with (acute) exacerbation: Secondary | ICD-10-CM | POA: Diagnosis not present

## 2021-12-08 LAB — BASIC METABOLIC PANEL
Anion gap: 6 (ref 5–15)
BUN: 32 mg/dL — ABNORMAL HIGH (ref 6–20)
CO2: 29 mmol/L (ref 22–32)
Calcium: 8.3 mg/dL — ABNORMAL LOW (ref 8.9–10.3)
Chloride: 102 mmol/L (ref 98–111)
Creatinine, Ser: 0.88 mg/dL (ref 0.61–1.24)
GFR, Estimated: >60 mL/min (ref >60)
Glucose, Bld: 113 mg/dL — ABNORMAL HIGH (ref 70–99)
Potassium: 3.7 mmol/L (ref 3.5–5.1)
Sodium: 137 mmol/L (ref 135–145)

## 2021-12-08 LAB — CBC
HCT: 40.5 % (ref 39.0–52.0)
Hemoglobin: 12.8 g/dL — ABNORMAL LOW (ref 13.0–17.0)
MCH: 29 pg (ref 26.0–34.0)
MCHC: 31.6 g/dL (ref 30.0–36.0)
MCV: 91.8 fL (ref 80.0–100.0)
Platelets: 321 10*3/uL (ref 150–400)
RBC: 4.41 MIL/uL (ref 4.22–5.81)
RDW: 13.7 % (ref 11.5–15.5)
WBC: 20.7 10*3/uL — ABNORMAL HIGH (ref 4.0–10.5)
nRBC: 0 % (ref 0.0–0.2)

## 2021-12-08 LAB — GLUCOSE, CAPILLARY
Glucose-Capillary: 107 mg/dL — ABNORMAL HIGH (ref 70–99)
Glucose-Capillary: 122 mg/dL — ABNORMAL HIGH (ref 70–99)
Glucose-Capillary: 170 mg/dL — ABNORMAL HIGH (ref 70–99)
Glucose-Capillary: 80 mg/dL (ref 70–99)

## 2021-12-08 MED ORDER — NYSTATIN 100000 UNIT/GM EX CREA
TOPICAL_CREAM | Freq: Three times a day (TID) | CUTANEOUS | 0 refills | Status: DC
  Filled 2021-12-08: qty 30, 15d supply, fill #0

## 2021-12-08 MED ORDER — FUROSEMIDE 40 MG PO TABS
40.0000 mg | ORAL_TABLET | Freq: Every day | ORAL | 0 refills | Status: DC
  Filled 2021-12-08: qty 30, 30d supply, fill #0

## 2021-12-08 MED ORDER — METOPROLOL SUCCINATE ER 50 MG PO TB24
50.0000 mg | ORAL_TABLET | Freq: Every day | ORAL | 0 refills | Status: DC
  Filled 2021-12-08: qty 30, 30d supply, fill #0

## 2021-12-08 NOTE — Progress Notes (Signed)
AVS and discharge instructions reviewed w/ patient. Patient verbalized understanding and had no further questions. Patient set up transportation for self to home.

## 2021-12-08 NOTE — TOC Transition Note (Addendum)
Transition of Care [facility]) - CM/SW Discharge Note   Patient Details  Name: James [name] MRN: [id number] Date of Birth: 09-05-1960  [facility] of Care [facility]) CM/SW Contact:  Dessa Phi, RN Phone Number: 12/08/2021, 1:14 PM   Clinical Narrative: spoke to patient about d/c home. Already has Home 02-has travel tank. Recommended for HHPT-will check with HHC agency-[facility] rep Caryl Pina will screen & let me know. Nsg aware.   -1:32p-[facility] rep Claiborne Billings accepted for HHPT. No further CM needs.   Final next level of care: [place] Barriers to Discharge: No Barriers Identified   Patient Goals and CMS Choice Patient states their goals for this hospitalization and ongoing recovery are:: unable to state due to vent      Discharge Placement                       Discharge Plan and Services   Discharge Planning Services: CM Consult                                 Social Determinants of Health (SDOH) Interventions Housing Interventions: Intervention Not Indicated   Readmission Risk Interventions    02/04/2021   12:44 PM 08/14/2020    9:59 AM 04/24/2020    1:03 PM  Readmission Risk Prevention Plan  Post Dischage Appt Complete    Medication Screening Complete    Transportation Screening Complete Complete Complete  PCP or Specialist Appt within 3-5 Days  Complete   Not Complete comments  patient already has an appointment schedules with [name]   [facility] or [place]  Complete   Social Work Consult for [place] Planning/Counseling  Complete   Palliative Care Screening  Not Applicable   Medication Review (RN Care Manager)  Complete   PCP or Specialist appointment within 3-5 days of discharge   Not Complete  PCP/Specialist Appt Not Complete comments   Pt had existing appt 5/23--this is first available per [facility]  [place] or [place]   Complete  SW Recovery Care/Counseling Consult   Complete  Palliative Care Screening   Complete   [place]   Not Applicable

## 2021-12-08 NOTE — Plan of Care (Signed)
  Problem: Activity: Goal: Ability to tolerate increased activity will improve Outcome: Progressing   Problem: Respiratory: Goal: Ability to maintain a clear airway and adequate ventilation will improve Outcome: Progressing   Problem: Coping: Goal: Ability to adjust to condition or change in health will improve Outcome: Progressing   Problem: Activity: Goal: Risk for activity intolerance will decrease Outcome: Progressing

## 2021-12-08 NOTE — Progress Notes (Signed)
Physical Therapy Treatment Patient Details Name: James [name] MRN: [id number] DOB: 1960/12/19 Today's Date: 12/08/2021   History of Present Illness 61 y.o. male  admitted on 11/28/2021 with acute respiratory failure secondary to COPD exacerbation and community acquired PNA requiring intubation. extubated 12/01/21. Left heart cath 12/4. PMH: COPD on home O2, HTN, HLD, DM II and ETOH abuse    PT Comments    Pt symptoms and mobility have significantly improved since physical therapy evaluation.  Pt able to ambulate in hallway with RW and no deficits observed.  Updated d/c recommendations.  Pt anticipates d/c home today.    Recommendations for follow up therapy are one component of a multi-disciplinary discharge planning process, led by the attending physician.  Recommendations may be updated based on patient status, additional functional criteria and insurance authorization.  Follow Up Recommendations  Home health PT     Assistance Recommended at Discharge PRN  Patient can return home with the following Help with stairs or ramp for entrance;Assist for transportation;Assistance with cooking/housework;A little help with bathing/dressing/bathroom;A little help with walking and/or transfers   Equipment Recommendations  Rolling walker (2 wheels)    Recommendations for Other Services       Precautions / Restrictions Precautions Precautions: Fall Precaution Comments: chronic 2L O2 [place]     Mobility  Bed Mobility               General bed mobility comments: pt in recliner    Transfers Overall transfer level: Needs assistance Equipment used: Rolling walker (2 wheels) Transfers: Sit to/from Stand Sit to Stand: Min guard, Supervision                Ambulation/Gait Ambulation/Gait assistance: Min guard, Supervision Gait Distance (Feet): 200 Feet Assistive device: Rolling walker (2 wheels) Gait Pattern/deviations: Step-through pattern, Decreased stride length        General Gait Details: pt reports feeling closer to baseline, anticipates d/c home today, no unsteadiness or LOB observed, no SOB reported, maintained his baseline 2L O2 [place]   Stairs             Wheelchair Mobility    Modified Rankin (Stroke Patients Only)       Balance                                            Cognition Arousal/Alertness: Awake/alert Behavior During Therapy: WFL for tasks assessed/performed Overall Cognitive Status: Within Functional Limits for tasks assessed                                          Exercises      General Comments        Pertinent Vitals/Pain Pain Assessment Pain Assessment: No/denies pain    Home Living                          Prior Function            PT Goals (current goals can now be found in the care plan section) Progress towards PT goals: Progressing toward goals    Frequency    Min 3X/week      PT Plan Discharge plan needs to be updated    Co-evaluation  AM-PAC PT "6 Clicks" Mobility   Outcome Measure  Help needed turning from your back to your side while in a flat bed without using bedrails?: A Little Help needed moving from lying on your back to sitting on the side of a flat bed without using bedrails?: A Little Help needed moving to and from a bed to a chair (including a wheelchair)?: A Little Help needed standing up from a chair using your arms (e.g., wheelchair or bedside chair)?: A Little Help needed to walk in hospital room?: A Little Help needed climbing 3-5 steps with a railing? : A Little 6 Click Score: 18    End of Session Equipment Utilized During Treatment: Oxygen Activity Tolerance: Patient tolerated treatment well Patient left: in chair;with call bell/phone within reach Nurse Communication: Mobility status PT Visit Diagnosis: Difficulty in walking, not elsewhere classified (R26.2)     Time: 7680-8811 PT Time  Calculation (min) (ACUTE ONLY): 9 min  Charges:  $Gait Training: 8-22 mins                    Arlyce Dice, DPT Physical Therapist Acute Rehabilitation Services Preferred contact method: Secure Chat Weekend Pager Only: [phone] Office: [place] 12/08/2021, 12:09 PM

## 2021-12-08 NOTE — Consult Note (Signed)
   [facility] Inpatient Consult   12/08/2021  James [name] January 11, 1960 [id number]  Managed Medicaid[MM]:  [employer]  *High Risk Managed Medicaid  [facility] Liaison remote coverage for patient at [facility]  Follow up:  Check for active home health services  Patient is active with the MM team and has had [facility] in the past noted. Referral for to check for agency with home health in the past, with secure chat from inpatient [facility] RNCM and staff RN received  Natividad Brood, RN BSN [facility]  [phone] business mobile phone Toll free office [phone]  *[facility]  [phone] Fax number: [phone] [email or web address]

## 2021-12-08 NOTE — Discharge Summary (Signed)
Physician Discharge Summary   [name] MRN: [id number] DOB: June 14, 1960  Admit date:     11/28/2021  Discharge date: 12/08/21  Discharge Physician: Marylu Lund   PCP: [name], MD   Recommendations at discharge:    Follow up with PCP in 1-2 weeks Follow up with Cardiology as scheduled  Discharge Diagnoses: Principal Problem:   Respiratory failure ([place]) Active Problems:   COPD exacerbation ([place])   Malnutrition of moderate degree   Non-STEMI (non-ST elevated myocardial infarction) ([place])  Resolved Problems:   * No resolved hospital problems. *  Hospital Course: 61 year old gentleman from home, has history of COPD and chronic hypoxemic respiratory failure on 2 L oxygen at home.  He does have a history of type 2 diabetes, hypertension.  He was found with severe COPD exacerbation secondary to rhinovirus infection, intubated and admitted to ICU.  Also found to have SVT , not Afib 1/25 Admit, intubated, started ABx, start cardizem gtt-->got hypotensive. Converted to SR but started on Neo.  11/26 Overnight transferred from [facility] ED to [facility] ICU 11/27 Remains intubated on Prop/Versed for sedation; increased secretions with high peak pressures on vent, Resp Cx sent. 11/28 Resp Cx with few gram + cocci in pairs. FiO2 needs down to 50%. Secretions improved, but ongoing breath stacking. Sedation to Precedex. Tolerating PSV. Extubated. Hypertensive, started on cleviprex + precedex Cardiology following and diastolic CHF. Ischemic evaluation with left heart cath planned for 12/4.  Assessment and Plan: Acute on chronic hypoxemic and hypercapnic respiratory failure: Acute COPD exacerbation due to rhinovirus infection as well as superimposed bacterial pneumonia: On baseline 2 L oxygen at home.  Extubated to 2 L of oxygen. Remained stable on transfer to floor Continued Brovana, Pulmicort and Yupelri.  On as needed bronchodilator therapy. Solu-Medrol, tapered off to  prednisone Completed broad spec abx   Essential hypertension with hypertensive emergency: Initially hypotensive after intubation on vasopressors.  Then hypertensive.  Patient was on Cleviprex infusion. Currently blood pressure stabilized on blood pressure and Cozaar.  Given lasix   Recurrent SVT:  Echo with ejection fraction 50 to 55%.  Cardiology following.  Currently in sinus rhythm.  On metoprolol, aspirin.  Improved.   Type 2 diabetes poor control, steroid-induced hyperglycemia: Fairly stable.  Not on treatment at home.   Elevated troponins with T wave depression: Respiratory status improving.  Cardiology had been following. Underwent clear heart cath 12/4. Cardiology recs for toprol xl 50mg . No need for ASA. Cardiology to f/u in 4 weeks   Clinically improving.   -Continue to work with PT OT.   -PT recommended SNF but patient reportedly felt like he can go home.   -Eager to go home   Rash -pruritic rash in gluteal region, groin, and also over L forarm (areas pt is scratching) -Rash appears fungal -continue topical antifungal       Consultants: Cardiology, PCCM Procedures performed:   Disposition: Home Diet recommendation:  Cardiac diet DISCHARGE MEDICATION: Allergies as of 12/08/2021   No Known Allergies      Medication List     TAKE these medications    Breztri Aerosphere 160-9-4.8 MCG/ACT Aero Generic drug: Budeson-Glycopyrrol-Formoterol Inhale 2 puffs into the lungs in the morning and at bedtime.   clobetasol cream 0.05 % Commonly known as: TEMOVATE Apply 1 Application topically 2 (two) times daily. What changed:  when to take this reasons to take this   clotrimazole-betamethasone cream Commonly known as: LOTRISONE Apply 1 Application topically 2 (two) times daily to  groin affected areas What changed:  when to take this reasons to take this   furosemide 40 MG tablet Commonly known as: LASIX Take 1 tablet (40 mg total) by mouth daily. Start taking  on: December 09, 2021 What changed:  medication strength how much to take when to take this reasons to take this   glipiZIDE 5 MG 24 hr tablet Commonly known as: GLUCOTROL XL Take 1 tablet (5 mg total) by mouth daily with breakfast.   losartan 50 MG tablet Commonly known as: COZAAR Take 1 tablet (50 mg total) by mouth daily.   metoprolol succinate 50 MG 24 hr tablet Commonly known as: TOPROL-XL Take 1 tablet (50 mg total) by mouth daily. Take with or immediately following a meal. Start taking on: December 09, 2021 What changed:  medication strength how much to take additional instructions   nystatin cream Commonly known as: MYCOSTATIN Apply topically 3 (three) times daily. Apply to affected areas   pantoprazole 40 MG tablet Commonly known as: PROTONIX Take 1 tablet (40 mg total) by mouth daily.   predniSONE 10 MG tablet Commonly known as: DELTASONE Take 1 tablet (10 mg total) by mouth daily.   pregabalin 100 MG capsule Commonly known as: Lyrica Take 1 capsule (100 mg total) by mouth 2 (two) times daily.   Ventolin HFA 108 (90 Base) MCG/ACT inhaler Generic drug: albuterol Inhale 2 puffs into the lungs every 4 (four) hours as needed. What changed:  reasons to take this Another medication with the same name was removed. Continue taking this medication, and follow the directions you see here.   vitamin D3 25 MCG tablet Commonly known as: CHOLECALCIFEROL Take 1 tablet by mouth daily.        Follow-up Information     [name], MD Follow up in 1 week(s).   Specialty: Pulmonary Disease Why: Hospital follow up Contact information: [address] [place] [address] [phone]                Discharge Exam: Danley Danker Weights   12/01/21 0500 12/02/21 0350 12/07/21 0600  Weight: 97.1 kg 93.6 kg 84.6 kg   General exam: Awake, laying in bed, in nad Respiratory system: Normal respiratory effort, no wheezing Cardiovascular system:  regular rate, s1, s2 Gastrointestinal system: Soft, nondistended, positive BS Central nervous system: CN2-12 grossly intact, strength intact Extremities: Perfused, no clubbing Skin: Normal skin turgor, no notable skin lesions seen Psychiatry: Mood normal // no visual hallucinations   Condition at discharge: fair  The results of significant diagnostics from this hospitalization (including imaging, microbiology, ancillary and laboratory) are listed below for reference.   Imaging Studies: CARDIAC CATHETERIZATION  Result Date: 12/07/2021 Images from the original result were not included. DAVIEL ALLEGRETTO is a 61 y.o. male  [id number] LOCATION:  FACILITY: [facility] PHYSICIAN: Quay Burow, M.D. 09-04-1960 DATE OF PROCEDURE:  12/07/2021 DATE OF DISCHARGE: CARDIAC CATHETERIZATION History obtained from chart review.61 y.o. male with severe COPD on home O2, HTN, HLD, DM II and EtOH abuse admitted on 11/28/2021 with acute respiratory failure secondary to COPD exacerbation and community acquired PNA requiring intubation. Cardiology consulted for elevated troponin   Mr. Shawler was admitted with respiratory failure.  His troponins were mild to moderately elevated.  His EF was normal.  He did have new anterolateral T wave inversion.  His cardiac catheterization revealed clean coronary arteries with an LVEDP of 6 mmHg.  I suspect this was a "type II"  non-STEMI.  Medical therapy  will be recommended.  The sheath was removed and a TR band was placed on the right wrist to achieve patent hemostasis.  The patient left lab in stable condition.  Once the TR band is off, patient be transported back twice a long hospital.  Dr. Fransico Him, the patient's attending cardiologist, was notified of these results. Quay Burow. MD, [facility] 12/07/2021 9:40 AM    DG CHEST PORT 1 VIEW  Result Date: 11/30/2021 CLINICAL DATA:  Respiratory failure EXAM: PORTABLE CHEST 1 VIEW COMPARISON:  11/29/2021 FINDINGS: Pulmonary vascular congestion.  Alveolar density right lower hemithorax. Right-sided pleural effusion. No pneumothorax identified. Aorta is calcified. Endotracheal tube tip at the thoracic inlet. NG tube tip below the diaphragm and off x-ray. IMPRESSION: Vascular congestion.  Right-sided consolidation and effusion. Electronically Signed   By: Sammie Bench M.D.   On: 11/30/2021 09:02   ECHOCARDIOGRAM COMPLETE  Result Date: 11/29/2021    ECHOCARDIOGRAM REPORT   Patient Name:   CULLIN DISHMAN Date of Exam: 11/29/2021 Medical Rec #:  [id number]      Height:       69.0 in Accession #:    [id number]     Weight:       205.5 lb Date of Birth:  Feb 07, 1960      BSA:          2.090 m Patient Age:    24 years       BP:           105/64 mmHg Patient Gender: M              HR:           80 bpm. Exam Location:  Inpatient Procedure: 2D Echo and Intracardiac Opacification Agent Indications:    atrial fibrillation  History:        Patient has prior history of Echocardiogram examinations, most                 recent 10/19/2019. COPD; Risk Factors:Current Smoker,                 Hypertension and Diabetes.  Sonographer:    Harvie Junior Referring Phys: Toney Sang SOOD  Sonographer Comments: Technically difficult study due to poor echo windows, echo performed with patient supine and on artificial respirator and patient is obese. Image acquisition challenging due to COPD, Image acquisition challenging due to patient body  habitus and Image acquisition challenging due to respiratory motion. IMPRESSIONS  1. Left ventricular ejection fraction, by estimation, is 50 to 55%. The left ventricle has low normal function. The left ventricle has no regional wall motion abnormalities. Left ventricular diastolic parameters are consistent with Grade I diastolic dysfunction (impaired relaxation).  2. Right ventricular systolic function is normal. The right ventricular size is normal. Tricuspid regurgitation signal is inadequate for assessing PA pressure.  3. The mitral valve  is normal in structure. No evidence of mitral valve regurgitation.  4. The aortic valve is grossly normal. Aortic valve regurgitation is not visualized. No aortic stenosis is present.  5. The inferior vena cava is normal in size with greater than 50% respiratory variability, suggesting right atrial pressure of 3 mmHg. Comparison(s): Prior images reviewed side by side. The left ventricular systolic function is worsened. but at the lower limit of normal. FINDINGS  Left Ventricle: Left ventricular ejection fraction, by estimation, is 50 to 55%. The left ventricle has low normal function. The left ventricle has no regional wall motion abnormalities. Definity contrast agent was  given IV to delineate the left ventricular endocardial borders. The left ventricular internal cavity size was normal in size. There is no left ventricular hypertrophy. Left ventricular diastolic parameters are consistent with Grade I diastolic dysfunction (impaired relaxation). Normal  left ventricular filling pressure. Right Ventricle: The right ventricular size is normal. No increase in right ventricular wall thickness. Right ventricular systolic function is normal. Tricuspid regurgitation signal is inadequate for assessing PA pressure. Left Atrium: Left atrial size was normal in size. Right Atrium: Right atrial size was normal in size. Pericardium: There is no evidence of pericardial effusion. Presence of epicardial fat layer. Mitral Valve: The mitral valve is normal in structure. No evidence of mitral valve regurgitation. Tricuspid Valve: The tricuspid valve is grossly normal. Tricuspid valve regurgitation is not demonstrated. Aortic Valve: The aortic valve is grossly normal. Aortic valve regurgitation is not visualized. No aortic stenosis is present. Aortic valve mean gradient measures 3.0 mmHg. Aortic valve peak gradient measures 4.8 mmHg. Aortic valve area, by VTI measures 2.94 cm. Pulmonic Valve: The pulmonic valve was not well  visualized. Aorta: The aortic root is normal in size and structure. Venous: The inferior vena cava is normal in size with greater than 50% respiratory variability, suggesting right atrial pressure of 3 mmHg. IAS/Shunts: The interatrial septum was not well visualized.  LEFT VENTRICLE PLAX 2D LVIDd:         4.30 cm      Diastology LVIDs:         3.10 cm      LV e' medial:    7.62 cm/s LV PW:         1.10 cm      LV E/e' medial:  6.5 LV IVS:        1.10 cm      LV e' lateral:   9.14 cm/s LVOT diam:     2.10 cm      LV E/e' lateral: 5.4 LV SV:         55 LV SV Index:   26 LVOT Area:     3.46 cm  LV Volumes (MOD) LV vol d, MOD A2C: 57.4 ml LV vol d, MOD A4C: 125.0 ml LV vol s, MOD A4C: 52.8 ml LV SV MOD A4C:     125.0 ml RIGHT VENTRICLE RV Basal diam:  4.10 cm RV Mid diam:    3.90 cm TAPSE (M-mode): 1.6 cm LEFT ATRIUM             Index        RIGHT ATRIUM           Index LA diam:        3.30 cm 1.58 cm/m   RA Area:     12.60 cm LA Vol (A2C):   46.9 ml 22.44 ml/m  RA Volume:   29.30 ml  14.02 ml/m LA Vol (A4C):   40.7 ml 19.48 ml/m LA Biplane Vol: 48.0 ml 22.97 ml/m  AORTIC VALVE                    PULMONIC VALVE AV Area (Vmax):    2.80 cm     PV Vmax:       0.83 m/s AV Area (Vmean):   2.77 cm     PV Peak grad:  2.8 mmHg AV Area (VTI):     2.94 cm AV Vmax:           110.00 cm/s AV Vmean:  78.200 cm/s AV VTI:            0.186 m AV Peak Grad:      4.8 mmHg AV Mean Grad:      3.0 mmHg LVOT Vmax:         88.80 cm/s LVOT Vmean:        62.600 cm/s LVOT VTI:          0.158 m LVOT/AV VTI ratio: 0.85  AORTA Ao Root diam: 3.70 cm MITRAL VALVE MV Area (PHT): 2.60 cm    SHUNTS MV Decel Time: 292 msec    Systemic VTI:  0.16 m MV E velocity: 49.30 cm/s  Systemic Diam: 2.10 cm MV A velocity: 83.10 cm/s MV E/A ratio:  0.59 Mihai Croitoru MD Electronically signed by Sanda Klein MD Signature Date/Time: 11/29/2021/12:21:12 PM    Final    DG Abd 1 View  Result Date: 11/29/2021 CLINICAL DATA:  Orogastric tube  placement. EXAM: ABDOMEN - 1 VIEW COMPARISON:  October 19, 2019. FINDINGS: The bowel gas pattern is normal. Distal tip of nasogastric tube is seen in expected position of the stomach. No radio-opaque calculi or other significant radiographic abnormality are seen. IMPRESSION: Distal tip of nasogastric tube seen in expected position of the stomach. Electronically Signed   By: Marijo Conception M.D.   On: 11/29/2021 10:43   DG Chest Port 1 View  Result Date: 11/29/2021 CLINICAL DATA:  Respiratory failure. EXAM: PORTABLE CHEST 1 VIEW COMPARISON:  11/28/2021 FINDINGS: The ET tube tip is above the carina. Nasogastric tube and side port are below the GE junction. Stable cardiomediastinal contours. Persistent airspace disease is identified within the right mid and right lower lung. Similar appearance of chronic bilateral rib fractures and left clavicle fracture. IMPRESSION: 1. Persistent airspace disease within the right lung. 2. Stable support apparatus. Electronically Signed   By: Kerby Moors M.D.   On: 11/29/2021 06:53   DG Chest Port 1 View  Result Date: 11/28/2021 CLINICAL DATA:  Shortness of breath.  Evaluate ET tube placement. EXAM: PORTABLE CHEST 1 VIEW COMPARISON:  10/26/2021 FINDINGS: ETT tip is 3 cm above the carina. Enteric tube tip and side port are in the stomach below the GE junction. Normal heart size. Aortic atherosclerosis. Persistent and progressive airspace disease is identified within the right mid and right lower lung. Left lung appears clear. Numerous bilateral chronic rib fracture deformities. Remote distal left clavicle fracture. IMPRESSION: 1. Satisfactory position of ETT and enteric tube. 2. Persistent and progressive airspace disease within the right mid and right lower lung. Imaging findings are concerning for pneumonia. Follow-up imaging is recommended to ensure complete resolution and to exclude underlying malignancy. Electronically Signed   By: Kerby Moors M.D.   On: 11/28/2021  06:36    Microbiology: Results for orders placed or performed during the hospital encounter of 11/28/21  Respiratory (~20 pathogens) panel by PCR     Status: Abnormal   Collection Time: 11/28/21  5:00 AM   Specimen: Nasopharyngeal Swab; Respiratory  Result Value Ref Range Status   Adenovirus NOT DETECTED NOT DETECTED Final   Coronavirus 229E NOT DETECTED NOT DETECTED Final    Comment: (NOTE) The Coronavirus on the Respiratory Panel, DOES NOT test for the novel  Coronavirus (2019 nCoV)    Coronavirus HKU1 NOT DETECTED NOT DETECTED Final   Coronavirus NL63 NOT DETECTED NOT DETECTED Final   Coronavirus OC43 NOT DETECTED NOT DETECTED Final   Metapneumovirus NOT DETECTED NOT DETECTED Final   Rhinovirus /  Enterovirus DETECTED (A) NOT DETECTED Final   Influenza A NOT DETECTED NOT DETECTED Final   Influenza B NOT DETECTED NOT DETECTED Final   Parainfluenza Virus 1 NOT DETECTED NOT DETECTED Final   Parainfluenza Virus 2 NOT DETECTED NOT DETECTED Final   Parainfluenza Virus 3 NOT DETECTED NOT DETECTED Final   Parainfluenza Virus 4 NOT DETECTED NOT DETECTED Final   Respiratory Syncytial Virus NOT DETECTED NOT DETECTED Final   Bordetella pertussis NOT DETECTED NOT DETECTED Final   Bordetella Parapertussis NOT DETECTED NOT DETECTED Final   Chlamydophila pneumoniae NOT DETECTED NOT DETECTED Final   Mycoplasma pneumoniae NOT DETECTED NOT DETECTED Final    Comment: Performed at [facility], [place] [address], [place], [place] [address]  Resp Panel by RT-PCR (Flu A&B, Covid) Nasopharyngeal Swab     Status: None   Collection Time: 11/28/21  5:00 AM   Specimen: Nasopharyngeal Swab; Nasal Swab  Result Value Ref Range Status   SARS Coronavirus 2 by RT PCR NEGATIVE NEGATIVE Final    Comment: (NOTE) SARS-CoV-2 target nucleic acids are NOT DETECTED.  The SARS-CoV-2 RNA is generally detectable in upper respiratory specimens during the acute phase of infection. The lowest concentration of  SARS-CoV-2 viral copies this assay can detect is 138 copies/mL. A negative result does not preclude SARS-Cov-2 infection and should not be used as the sole basis for treatment or other patient management decisions. A negative result may occur with  improper specimen collection/handling, submission of specimen other than nasopharyngeal swab, presence of viral mutation(s) within the areas targeted by this assay, and inadequate number of viral copies(<138 copies/mL). A negative result must be combined with clinical observations, patient history, and epidemiological information. The expected result is Negative.  Fact Sheet for Patients:  EntrepreneurPulse.com.au  Fact Sheet for Healthcare Providers:  IncredibleEmployment.be  This test is no t yet approved or cleared by the [place] FDA and  has been authorized for detection and/or diagnosis of SARS-CoV-2 by FDA under an Emergency Use Authorization (EUA). This EUA will remain  in effect (meaning this test can be used) for the duration of the COVID-19 declaration under Section 564(b)(1) of the Act, 21 U.S.C.section 360bbb-3(b)(1), unless the authorization is terminated  or revoked sooner.       Influenza A by PCR NEGATIVE NEGATIVE Final   Influenza B by PCR NEGATIVE NEGATIVE Final    Comment: (NOTE) The Xpert Xpress SARS-CoV-2/FLU/RSV plus assay is intended as an aid in the diagnosis of influenza from Nasopharyngeal swab specimens and should not be used as a sole basis for treatment. Nasal washings and aspirates are unacceptable for Xpert Xpress SARS-CoV-2/FLU/RSV testing.  Fact Sheet for Patients: EntrepreneurPulse.com.au  Fact Sheet for Healthcare Providers: IncredibleEmployment.be  This test is not yet approved or cleared by the [place] FDA and has been authorized for detection and/or diagnosis of SARS-CoV-2 by FDA under an Emergency Use  Authorization (EUA). This EUA will remain in effect (meaning this test can be used) for the duration of the COVID-19 declaration under Section 564(b)(1) of the Act, 21 U.S.C. section 360bbb-3(b)(1), unless the authorization is terminated or revoked.  Performed at [facility], [place] [address] [place], [place] [address]   Culture, blood (Routine X 2) w Reflex to ID Panel     Status: None   Collection Time: 11/28/21  6:10 AM   Specimen: BLOOD RIGHT HAND  Result Value Ref Range Status   Specimen Description BLOOD RIGHT HAND  Final   Special Requests  Final    BOTTLES DRAWN AEROBIC AND ANAEROBIC Blood Culture adequate volume   Culture   Final    NO GROWTH 5 DAYS Performed at [name], [place] [address], [place], [place] [address]    Report Status 12/03/2021 FINAL  Final  Culture, blood (Routine X 2) w Reflex to ID Panel     Status: None   Collection Time: 11/28/21  6:15 AM   Specimen: BLOOD LEFT HAND  Result Value Ref Range Status   Specimen Description BLOOD LEFT HAND  Final   Special Requests   Final    BOTTLES DRAWN AEROBIC AND ANAEROBIC Blood Culture results may not be optimal due to an inadequate volume of blood received in culture bottles   Culture   Final    NO GROWTH 5 DAYS Performed at [facility], [place] [address], [place], [place] [address]    Report Status 12/03/2021 FINAL  Final  Culture, Respiratory w Gram Stain     Status: None   Collection Time: 11/30/21  8:26 AM   Specimen: Tracheal Aspirate; Respiratory  Result Value Ref Range Status   Specimen Description   Final    TRACHEAL ASPIRATE Performed at [place] [address], [place], [place] [address]    Special Requests   Final    NONE Performed at [facility], [place] [address], [place], [place] [address]    Gram Stain   Final    ABUNDANT WBC PRESENT, PREDOMINANTLY PMN FEW GRAM POSITIVE COCCI IN PAIRS    Culture   Final    RARE Normal  respiratory flora-no Staph aureus or Pseudomonas seen Performed at [place] [address], [place],  [address]    Report Status 12/02/2021 FINAL  Final    Labs: CBC: Recent Labs  Lab 12/04/21 0506 12/05/21 0536 12/06/21 0501 12/07/21 0405 12/08/21 0514  WBC 15.8* 14.5* 13.1* 15.4* 20.7*  HGB 13.8 14.3 13.3 13.3 12.8*  HCT 43.6 44.6 40.9 41.8 40.5  MCV 91.2 89.9 89.3 90.1 91.8  PLT 335 339 320 328 130   Basic Metabolic Panel: Recent Labs  Lab 12/02/21 0403 12/03/21 0300 12/04/21 0506 12/05/21 0536 12/06/21 0501 12/07/21 0405 12/08/21 0514  NA 142 142 142 139 137 136 137  K 4.4 3.9 4.1 4.0 3.8 3.5 3.7  CL 104 102 102 100 100 97* 102  CO2 30 30 31 30 28 29 29   GLUCOSE 130* 118* 90 102* 89 124* 113*  BUN 39* 35* 36* 48* 46* 49* 32*  CREATININE 0.63 0.67 0.68 0.84 0.89 1.12 0.88  CALCIUM 9.1 9.3 10.1 9.5 8.8* 8.7* 8.3*  MG 2.6* 2.3 2.3 2.2 2.1  --   --   PHOS 3.2 4.7* 4.6 3.9 3.4  --   --    Liver Function Tests: Recent Labs  Lab 12/07/21 0405  AST 22  ALT 32  ALKPHOS 43  BILITOT 0.5  PROT 6.2*  ALBUMIN 3.3*   CBG: Recent Labs  Lab 12/07/21 2219 12/08/21 0001 12/08/21 0400 12/08/21 0750 12/08/21 1151  GLUCAP 130* 170* 107* 80 122*    Discharge time spent: less than 30 minutes.  Signed: Marylu Lund, MD [facility] 12/08/2021

## 2021-12-08 NOTE — Telephone Encounter (Signed)
Paperwork given to doctor, will fax paperwork once signed

## 2021-12-09 ENCOUNTER — Telehealth: Payer: Self-pay

## 2021-12-09 ENCOUNTER — Telehealth: Payer: Self-pay | Admitting: *Deleted

## 2021-12-09 LAB — LIPOPROTEIN A (LPA): Lipoprotein (a): 89.1 nmol/L — ABNORMAL HIGH (ref <75.0)

## 2021-12-09 NOTE — Telephone Encounter (Signed)
Transition Care Management Unsuccessful Follow-up Telephone Call  Date of discharge and from where:  12/08/2021, [facility]  Attempts:  1st Attempt  Reason for unsuccessful TCM follow-up call:  Voice mail full - [phone]

## 2021-12-09 NOTE — Patient Outreach (Signed)
  Care Coordination [facility] Note Transition Care Management Unsuccessful Follow-up Telephone Call  Date of discharge and from where:  12/08/21 from [facility]  Attempts:  1st Attempt  Reason for unsuccessful TCM follow-up call:  Unable to leave message   [name] RN, [place] RN Care Coordinator

## 2021-12-10 ENCOUNTER — Telehealth: Payer: Self-pay | Admitting: Critical Care Medicine

## 2021-12-10 ENCOUNTER — Telehealth: Payer: Self-pay

## 2021-12-10 ENCOUNTER — Other Ambulatory Visit: Payer: Self-pay

## 2021-12-10 MED ORDER — ALBUTEROL SULFATE (2.5 MG/3ML) 0.083% IN NEBU
2.5000 mg | INHALATION_SOLUTION | RESPIRATORY_TRACT | 0 refills | Status: DC | PRN
  Filled 2021-12-10: qty 90, 5d supply, fill #0

## 2021-12-10 NOTE — Telephone Encounter (Signed)
Refill for albuterol nebs sent.

## 2021-12-10 NOTE — Telephone Encounter (Signed)
Transition Care Management Follow-up Telephone Call Date of discharge and from where: 12/08/2021, [facility] How have you been since you were released from the hospital? He stated that he is doing much better than a week ago.  Any questions or concerns? No  Items Reviewed: Did the pt receive and understand the discharge instructions provided? Yes  Medications obtained and verified?  He said he has all of medications  and has a nebulizer but no neb solution.   Other? No  Any new allergies since your discharge? No  Dietary orders reviewed? Yes Do you have support at home?  Lives alone but has some support from his sister, [name], who lives about a mile away  [place] and Equipment/Supplies: Were home health services ordered? yes If so, what is the name of the agency? [place]   Has the agency set up a time to come to the patient's home? No, not yet Were any new equipment or medical supplies ordered?  No What is the name of the medical supply agency? N/a Were you able to get the supplies/equipment? not applicable Do you have any questions related to the use of the equipment or supplies? No  Functional Questionnaire: (I = Independent and D = Dependent) ADLs: independent.  He said that he does not need an assistive device when ambulating. Using O2 @ 2L continuously    Follow up appointments reviewed:  PCP Hospital f/u appt confirmed? Yes  Scheduled to see [name]- 12/29/2021 Specialist Hospital f/u appt confirmed? No  Scheduled to see cardiology - 01/07/2022. Are transportation arrangements needed? No - he said [name], RN/THN schedules his rides.  If their condition worsens, is the pt aware to call PCP or go to the Emergency Dept.? Yes Was the patient provided with contact information for the PCP's office or ED? Yes Was to pt encouraged to call back with questions or concerns? Yes

## 2021-12-10 NOTE — Telephone Encounter (Signed)
Caller making provider aware they are unable to contact pt and services will be delayed until 12-12-21 because of that reason

## 2021-12-10 NOTE — Telephone Encounter (Signed)
I called patient and informed him that the neb solution order was sent to the pharmacy and I confirmed with Crissie Reese, Pharm Tech that the med will be mailed to him.  He said he understood and was very Patent attorney

## 2021-12-11 NOTE — Telephone Encounter (Signed)
FYI

## 2021-12-17 ENCOUNTER — Other Ambulatory Visit: Payer: Medicaid Other | Admitting: *Deleted

## 2021-12-17 ENCOUNTER — Encounter: Payer: Self-pay | Admitting: *Deleted

## 2021-12-17 NOTE — Patient Instructions (Signed)
Visit Information  James [name] was given information about Medicaid Managed Care team care coordination services as a part of their Healthy [facility] Medicaid benefit. James [name] verbally consented to engagement with the [facility] Managed Care team.   If you are experiencing a medical emergency, please call 911 or report to your local emergency department or urgent care.   If you have a non-emergency medical problem during routine business hours, please contact your provider's office and ask to speak with a nurse.   For questions related to your Healthy [facility] health plan, please call: [phone] or visit the homepage here: [email or web address]  If you would like to schedule transportation through your Healthy [facility] plan, please call the following number at least 2 days in advance of your appointment: [phone]  For information about your ride after you set it up, call Ride Assist at [phone]. Use this number to activate a Will Call pickup, or if your transportation is late for a scheduled pickup. Use this number, too, if you need to make a change or cancel a previously scheduled reservation.  If you need transportation services right away, call [phone]. The after-hours call center is staffed 24 hours to handle ride assistance and urgent reservation requests (including discharges) 365 days a year. Urgent trips include sick visits, hospital discharge requests and life-sustaining treatment.  Call the [place] at [phone], at any time, 24 hours a day, 7 days a week. If you are in danger or need immediate medical attention call 911.  If you would like help to quit smoking, call 1-800-QUIT-NOW (250)092-9657) OR Espaol: 1-855-Djelo-Ya (0-240-973-5329) o para ms informacin haga clic aqu or Text READY to 200-400 to register via text  James [name],   Please see education materials related to pneumonia provided as  print materials.   The patient verbalized understanding of instructions, educational materials, and care plan provided today and agreed to receive a mailed copy of patient instructions, educational materials, and care plan.   Telephone follow up appointment with Managed Medicaid care management team member scheduled for:01/18/22 @ 10:30am  [name] RN, BSN [place] RN Care Coordinator   Following is a copy of your plan of care:  Care Plan : RN Care Manager Plan of Care  Updates made by [name], RN since 12/17/2021 12:00 AM     Problem: Chronic Disease Management needs in patient with COPD   Priority: High     Long-Range Goal: Plan of care established for for Chronic Disease management for patient with COPD   Start Date: 11/13/2020  Expected End Date: 02/16/2022  Priority: High  Note:   Current Barriers:  Care Coordination needs related to Limited access to food  Chronic Disease Management support and education needs related to COPD James [name] had recent 10 day admission for COPD exacerbation. He is feeling better at home. He has all of his medications and taking as directed. Has not started HHPT and he feels that he is getting around ok and does not need PT at this time.  RNCM Clinical Goal(s):  Patient will verbalize understanding of plan for management of COPD as evidenced by taking all medications as prescribed, attending all scheduled appointments and contacting provider with questions and concerns take all medications exactly as prescribed and will call provider for medication related questions as evidenced by refilling medications and taking as prescribed    attend all scheduled medical appointments: 12/29/21 with PCP and 01/07/22 with [facility] as evidenced by provider notes in EMR        demonstrate improved adherence to prescribed treatment plan for COPD as evidenced by taking all medications as prescribed, attending all scheduled  appointments and contacting provider with questions and concerns continue to work with RN Care Manager and/or Social Worker to address care management and care coordination needs related to COPD as evidenced by adherence to CM Team Scheduled appointments     through collaboration with RN Care manager, provider, and care team.   Interventions: Inter-disciplinary care team collaboration (see longitudinal plan of care) Evaluation of current treatment plan related to  self management and patient's adherence to plan as established by provider   SDOH Barriers (Status:  Goal on track:  Yes. Patient interviewed and SDOH assessment performed        SDOH Interventions    Flowsheet Row Patient Outreach Telephone from 12/17/2021 in [place] ED to Hosp-Admission (Discharged) from 11/28/2021 in [place] Patient Outreach Telephone from 09/09/2021 in [place] Patient Outreach Telephone from 07/08/2021 in [place] Patient Outreach Telephone from 05/15/2021 in [place] Patient Outreach Telephone from 04/15/2021 in [place] Interventions        Food Insecurity Interventions Intervention Not Indicated -- -- -- -- --  Housing Interventions -- Intervention Not Indicated Intervention Not Indicated -- Intervention Not Indicated --  Transportation Interventions Other (Comment)  [RNCM assisted with arranging transportation to upcoming appointments] -- -- Other (Comment)  [Assisted with arranging transportation with [employer] medical transporation and having medications delivered] -- Other (Comment)  [Assisted with arranging transportation via [place]  Alcohol Usage Interventions -- -- Intervention Not Indicated (Score <7) -- -- --       Patient interviewed and  appropriate assessments performed Discussed plans with patient for ongoing care management follow up and provided patient with direct contact information for care management team-   COPD: (Status: Goal on Track (progressing): YES.) Long Term Goal  Reviewed medications with patient, including use of prescribed maintenance and rescue inhalers, and provided instruction on medication management and the importance of adherence Advised patient to self assesses COPD action plan zone and make appointment with provider if in the yellow zone for 48 hours without improvement Provided education about and advised patient to utilize infection prevention strategies to reduce risk of respiratory infection Discussed the importance of adequate rest and management of fatigue with COPD Advised patient to avoid going out in the cold, cover mouth with a scarf when going out Infection prevention reviewed Encouraged patient to continue to avoid drinking alcohol and avoid smoke Assisted with arranging transportation to PCP Hospital follow up on 12/29/21, pick up time 8:45am and return home time 11:30-this will allow patient time to pick up any needed medications at the pharmacy; 01/07/22 to [place], pick up time 7:20am with return home time 10am. Patient aware of transportation details Reviewed admission notes Discussed HHPT has not scheduled with patient, James [name] does not feel that he needs HHPT at this time Advised patient to request any needed refills on 12/25/21 and pick up from pharmacy on 12/29/21 after his appointment(pharmacy is in the same building)   Patient Goals/Self-Care Activities: Take medications as prescribed   Attend all scheduled provider appointments Call pharmacy for medication refills 3-7 days in advance of running out of medications Perform  all self care activities independently  Call provider office for new concerns or questions  - avoid second hand smoke - limit outdoor activity  during cold weather - keep follow-up appointments:  10/29/21 with [name] - eat healthy/prescribed diet: DASH - get at least 7 to 8 hours of sleep at night

## 2021-12-17 NOTE — Patient Outreach (Signed)
Medicaid Managed Care   Nurse Care Manager Note  12/17/2021 Name:  James [name]  James [name] is an 61 y.o. year old male who is [name] primary patient of James Gaskins Burnett Harry, [name].  The [facility] Managed Care Coordination team was consulted for assistance with:    COPD  James [name] was given information about Medicaid Managed Care Coordination team services today. James [name] Patient agreed to services and verbal consent obtained.  Engaged with patient by telephone for follow up visit in response to provider referral for case management and/or care coordination services.   Assessments/Interventions:  Review of past medical history, allergies, medications, health status, including review of consultants reports, laboratory and other test data, was performed as part of comprehensive evaluation and provision of chronic care management services.  SDOH (Social Determinants of Health) assessments and interventions performed: SDOH Interventions    Flowsheet Row Patient Outreach Telephone from 12/17/2021 in [place] ED to Hosp-Admission (Discharged) from 11/28/2021 in [place] Patient Outreach Telephone from 09/09/2021 in [place] Patient Outreach Telephone from 07/08/2021 in [place] Patient Outreach Telephone from 05/15/2021 in [place] Patient Outreach Telephone from 04/15/2021 in [place] Interventions        Food Insecurity Interventions Intervention Not Indicated -- -- -- -- --  Housing Interventions -- Intervention Not Indicated Intervention Not Indicated -- Intervention Not Indicated --  Transportation Interventions Other (Comment)  [RNCM assisted with arranging transportation to upcoming appointments] -- -- Other  (Comment)  [Assisted with arranging transportation with [employer] medical transporation and having medications delivered] -- Other (Comment)  [Assisted with arranging transportation via [place]  Alcohol Usage Interventions -- -- Intervention Not Indicated (Score <7) -- -- --       Care Plan  No Known Allergies  Medications Reviewed Today     Reviewed by James Montane, RN (Registered Nurse) on 12/17/21 at [place] List Status: <None>   Medication Order Taking? Sig Documenting Provider Last Dose Status Informant  albuterol (PROVENTIL) (2.5 MG/3ML) 0.083% nebulizer solution [id number] Yes Take 3 mLs (2.5 mg total) by nebulization every 4 (four) hours as needed for wheezing or shortness of breath. James [name] Taking Active   albuterol (VENTOLIN HFA) 108 [phone] Base) MCG/ACT inhaler [id number] Yes Inhale 2 puffs into the lungs every 4 (four) hours as needed.  Patient taking differently: Inhale 2 puffs into the lungs every 4 (four) hours as needed for wheezing or shortness of breath.   James [name] Taking Active Pharmacy Records  Budeson-Glycopyrrol-Formoterol [facility] AEROSPHERE) 160-9-4.8 MCG/ACT James [name] [id number] Yes Inhale 2 puffs into the lungs in the morning and at bedtime. James Hurter, [name] Taking Active Pharmacy Records           Med Note (James [name]   Wed Nov 18, 2021  3:30 PM)    clobetasol cream (TEMOVATE) 0.05 % [id number] Yes Apply 1 Application topically 2 (two) times daily.  Patient taking differently: Apply 1 Application topically 2 (two) times daily as needed (rash).   James [name] Taking Active Pharmacy Records  clotrimazole-betamethasone Donalynn Furlong) cream [id number] Yes Apply 1 Application topically 2 (two) times daily to groin affected areas  Patient taking differently: Apply 1 Application topically 2 (two) times daily as needed (rash).  James [name] Taking Active Pharmacy Records  furosemide (LASIX) 40 MG tablet [id number]  Yes Take 1 tablet (40 mg total) by mouth daily. James Hazel, [name] Taking Active   glipiZIDE (GLUCOTROL XL) 5 MG 24 hr tablet [id number] Yes Take 1 tablet (5 mg total) by mouth daily with breakfast. James [name] Taking Active Pharmacy Records  losartan (COZAAR) 50 MG tablet [id number] Yes Take 1 tablet (50 mg total) by mouth daily. James [name] Taking Active Pharmacy Records  metoprolol succinate (TOPROL-XL) 50 MG 24 hr tablet [id number] Yes Take 1 tablet (50 mg total) by mouth daily. Take with or immediately following [name] meal. James Hazel, [name] Taking Active   nystatin cream (MYCOSTATIN) [id number] Yes Apply topically 3 (three) times daily. Apply to affected areas James Hazel, [name] Taking Active   pantoprazole (PROTONIX) 40 MG tablet [id number] Yes Take 1 tablet (40 mg total) by mouth daily. James [name] Taking Active Pharmacy Records  predniSONE (DELTASONE) 10 MG tablet [id number] Yes Take 1 tablet (10 mg total) by mouth daily. James Natal, [name] Taking Active Pharmacy Records  pregabalin [facility]) 100 MG capsule [id number] Yes Take 1 capsule (100 mg total) by mouth 2 (two) times daily. James [name] Taking Active Pharmacy Records  Vitamin D3 (VITAMIN D) 25 MCG tablet [id number] Yes Take 1 tablet by mouth daily. [name], [name] Taking Active Pharmacy Records           Med Note James [name]   Tue Oct 13, 2021 10:31 AM)              Patient Active Problem List   Diagnosis Date Noted   Malnutrition of moderate degree 12/05/2021   Non-STEMI (non-ST elevated myocardial infarction) ([place]) 12/05/2021   Respiratory failure ([place]) 11/28/2021   COPD with pneumonia ([place]) 10/11/2021   Tinea cruris 07/21/2021   COPD with acute exacerbation ([place]) 07/12/2021   Community acquired pneumonia 07/12/2021   Multiple lung nodules on CT 06/11/2021   COPD with chronic bronchitis 02/01/2021   GERD (gastroesophageal reflux disease) 02/01/2021   Neuropathy  09/29/2020   Bronchiectasis ([place]) 09/24/2019   Type 2 diabetes mellitus without complication, without long-term current use of insulin ([place]) 07/24/2019   Other atopic dermatitis 05/01/2019   Chronic respiratory failure with hypoxia ([place]) 04/23/2019   Intellectual disability 04/23/2019   Alcohol use disorder 11/23/2018   COPD exacerbation ([place]) 02/16/2018   Hypertension 05/17/2016   Tobacco use disorder 04/06/2016   COPD mixed type ([place]) 03/26/2016   Acute on chronic respiratory failure with hypoxia and hypercapnia ([place]) 06/08/2013    Conditions to be addressed/monitored per PCP order:  COPD  Care Plan : RN Care Manager Plan of Care  Updates made by James Montane, RN since 12/17/2021 12:00 AM     Problem: Chronic Disease Management needs in patient with COPD   Priority: High     Long-Range Goal: Plan of care established for for Chronic Disease management for patient with COPD   Start Date: 11/13/2020  Expected End Date: 02/16/2022  Priority: High  Note:   Current Barriers:  Care Coordination needs related to Limited access to food  Chronic Disease Management support and education needs related to COPD Mr. Brazzel had recent 10 day admission for COPD exacerbation. He is feeling better at home. He has all of his medications and taking as directed. Has not started HHPT and he feels that he is getting around ok and does  not need PT at this time.  RNCM Clinical Goal(s):  Patient will verbalize understanding of plan for management of COPD as evidenced by taking all medications as prescribed, attending all scheduled appointments and contacting provider with questions and concerns take all medications exactly as prescribed and will call provider for medication related questions as evidenced by refilling medications and taking as prescribed    attend all scheduled medical appointments: 12/29/21 with PCP and 01/07/22 with [facility] as evidenced by provider notes in EMR        demonstrate  improved adherence to prescribed treatment plan for COPD as evidenced by taking all medications as prescribed, attending all scheduled appointments and contacting provider with questions and concerns continue to work with RN Care Manager and/or Social Worker to address care management and care coordination needs related to COPD as evidenced by adherence to CM Team Scheduled appointments     through collaboration with RN Care manager, provider, and care team.   Interventions: Inter-disciplinary care team collaboration (see longitudinal plan of care) Evaluation of current treatment plan related to  self management and patient's adherence to plan as established by provider   SDOH Barriers (Status:  Goal on track:  Yes. Patient interviewed and SDOH assessment performed        SDOH Interventions    Flowsheet Row Patient Outreach Telephone from 12/17/2021 in [place] ED to Hosp-Admission (Discharged) from 11/28/2021 in [place] Patient Outreach Telephone from 09/09/2021 in [place] Patient Outreach Telephone from 07/08/2021 in [place] Patient Outreach Telephone from 05/15/2021 in [place] Patient Outreach Telephone from 04/15/2021 in [place] Coordination  SDOH Interventions        Food Insecurity Interventions Intervention Not Indicated -- -- -- -- --  Housing Interventions -- Intervention Not Indicated Intervention Not Indicated -- Intervention Not Indicated --  Transportation Interventions Other (Comment)  [RNCM assisted with arranging transportation to upcoming appointments] -- -- Other (Comment)  [Assisted with arranging transportation with [employer] medical transporation and having medications delivered] -- Other (Comment)  [Assisted with arranging transportation via  [place]]  Alcohol Usage Interventions -- -- Intervention Not Indicated (Score <7) -- -- --       Patient interviewed and appropriate assessments performed Discussed plans with patient for ongoing care management follow up and provided patient with direct contact information for care management team-   COPD: (Status: Goal on Track (progressing): YES.) Long Term Goal  Reviewed medications with patient, including use of prescribed maintenance and rescue inhalers, and provided instruction on medication management and the importance of adherence Advised patient to self assesses COPD action plan zone and make appointment with provider if in the yellow zone for 48 hours without improvement Provided education about and advised patient to utilize infection prevention strategies to reduce risk of respiratory infection Discussed the importance of adequate rest and management of fatigue with COPD Advised patient to avoid going out in the cold, cover mouth with [name] scarf when going out Infection prevention reviewed Encouraged patient to continue to avoid drinking alcohol and avoid smoke Assisted with arranging transportation to PCP Hospital follow up on 12/29/21, pick up time 8:45am and return home time 11:30-this will allow patient time to pick up any needed medications at the pharmacy; 01/07/22 to [address], pick up time 7:20am with return home time 10am. Patient aware of transportation  details Reviewed admission notes Discussed HHPT has not scheduled with patient, Mr. Kunert does not feel that he needs HHPT at this time Advised patient to request any needed refills on 12/25/21 and pick up from pharmacy on 12/29/21 after his appointment(pharmacy is in the same building)   Patient Goals/Self-Care Activities: Take medications as prescribed   Attend all scheduled provider appointments Call pharmacy for medication refills 3-7 days in advance of running out of medications Perform all self care  activities independently  Call provider office for new concerns or questions  - avoid second hand smoke - limit outdoor activity during cold weather - keep follow-up appointments:  10/29/21 with [name] - eat healthy/prescribed diet: DASH - get at least 7 to 8 hours of sleep at night       Follow Up:  Patient agrees to Care Plan and Follow-up.  Plan: The Managed Medicaid care management team will reach out to the patient again over the next 30 days.  Date/time of next scheduled RN care management/care coordination outreach:  01/18/22 @ 10:30am  [name] RN, BSN [place] RN Care Coordinator

## 2021-12-23 ENCOUNTER — Other Ambulatory Visit: Payer: Self-pay

## 2021-12-25 ENCOUNTER — Other Ambulatory Visit: Payer: Self-pay

## 2021-12-28 NOTE — Progress Notes (Unsigned)
Established Patient Office Visit  Subjective   Patient ID: James [name], male    DOB: 13-Apr-1960  Age: 61 y.o. MRN: [id number] Transitional care post Hospital liver No chief complaint on file.   06/29/21 Since the last visit patient's been doing well however he ran out of his losartan for 1 week.  He does complain of an itching rash on both arms.  He is painting the inside of his house he tries to stay from the out-of-doors and where the heat is.  CT of the chest was done and showed pulmonary nodules will need to be repeated in 6 months.  Pneumonia has resolved.  He has been compliant with his inhaled medications.  Breathing appears to be at baseline.  7/18 Yet another readmission since last OV: Patient had developed pneumonia increased cough shortness of breath here today for follow-up  Patient still coughing mucus white in nature some wheezing still.  Below is copy discharge summary  12/26 [place] visit   3 admits since 07/2021 OV Discharge date: 12/08/21 Discharge Physician: Marylu Lund   PCP: [name], MD    Recommendations at discharge:    1.  Follow up with PCP in 1-2 weeks 2. Follow up with Cardiology as scheduled   Discharge Diagnoses: Principal Problem:   Respiratory failure ([place]) Active Problems:   COPD exacerbation ([facility])   Malnutrition of moderate degree   Non-STEMI (non-ST elevated myocardial infarction) ([place])   Resolved Problems:   * No resolved hospital problems. *   Hospital Course: 61 year old gentleman from home, has history of COPD and chronic hypoxemic respiratory failure on 2 L oxygen at home.  He does have a history of type 2 diabetes, hypertension.  He was found with severe COPD exacerbation secondary to rhinovirus infection, intubated and admitted to ICU.  Also found to have SVT , not Afib  1/25 Admit, intubated, started ABx, start cardizem gtt-->got hypotensive. Converted to SR but started on Neo.   11/26 Overnight transferred from  [facility] ED to [facility] ICU  11/27 Remains intubated on Prop/Versed for sedation; increased secretions with high peak pressures on vent, Resp Cx sent.  11/28 Resp Cx with few gram + cocci in pairs. FiO2 needs down to 50%. Secretions improved, but ongoing breath stacking. Sedation to Precedex. Tolerating PSV. Extubated. Hypertensive, started on cleviprex + precedex  Cardiology following and diastolic CHF.  Ischemic evaluation with left heart cath planned for 12/4.   Assessment and Plan: Acute on chronic hypoxemic and hypercapnic respiratory failure: Acute COPD exacerbation due to rhinovirus infection as well as superimposed bacterial pneumonia: On baseline 2 L oxygen at home.  Extubated to 2 L of oxygen. Remained stable on transfer to floor Continued Brovana, Pulmicort and Yupelri.  On as needed bronchodilator therapy. Solu-Medrol, tapered off to prednisone Completed broad spec abx   Essential hypertension with hypertensive emergency: Initially hypotensive after intubation on vasopressors.  Then hypertensive.  Patient was on Cleviprex infusion. Currently blood pressure stabilized on blood pressure and Cozaar.  Given lasix   Recurrent SVT:  Echo with ejection fraction 50 to 55%.  Cardiology following.  Currently in sinus rhythm.  On metoprolol, aspirin.  Improved.   Type 2 diabetes poor control, steroid-induced hyperglycemia: Fairly stable.  Not on treatment at home.   Elevated troponins with T wave depression: Respiratory status improving.  Cardiology had been following. Underwent clear heart cath 12/4. Cardiology recs for toprol xl 50mg . No need for ASA. Cardiology to f/u in 4 weeks  Clinically improving.   -Continue to work with PT OT.   -PT recommended SNF but patient reportedly felt like he can go home.   -Eager to go home   Rash -pruritic rash in gluteal region, groin, and also over L forarm (areas pt is scratching) -Rash appears fungal -continue topical antifungal        Date of  Admission: 07/12/2021  7:29 AM Date of Discharge:   07/14/2021 Attending Physician: Dr. Daryll Drown   Discharge Diagnosis: Principal Problem:   COPD with acute exacerbation ([place]) Active Problems:   Hypertension   Chronic respiratory failure with hypoxia ([place])   Type 2 diabetes mellitus without complication, without long-term current use of insulin ([place])   Community acquired pneumonia   Disposition and follow-up:   James [name] was discharged from [facility] in Stable condition.  At the hospital follow up visit please address:   1.  Follow-up:             a.  Community-acquired pneumonia: Patient was found to have left lower lobe pneumonia on admission.  He was treated with vancomycin, cefepime and azithromycin. Respiratory status improved to his baseline and he was discharged home with Augmentin and azithromycin to complete a 5-day course of antibiotics. Please reassess his respiratory status and repeat CBC to endure white count is stable.                           b.  COPD exacerbation: In the setting of community-acquired pneumonia. He was treated with prednisone 40 mg and DuoNebs. Discharged home on prednisone 40 mg for 3 more days, then 10 mg daily     2.  Labs / imaging needed at time of follow-up: CBC   3.  Pending labs/ test needing follow-up: None Past medical history reviewed no changes made  Review of Systems  Constitutional:  Negative for chills, diaphoresis, fever, malaise/fatigue and weight loss.  HENT:  Negative for congestion, hearing loss, nosebleeds, sore throat and tinnitus.   Eyes:  Negative for blurred vision, photophobia and redness.  Respiratory:  Positive for cough and shortness of breath. Negative for hemoptysis, sputum production, wheezing and stridor.        White  Cardiovascular:  Negative for chest pain, palpitations, orthopnea, claudication, leg swelling and PND.  Gastrointestinal:  Negative for abdominal pain, blood in stool,  constipation, diarrhea, heartburn, nausea and vomiting.  Genitourinary:  Negative for dysuria, flank pain, frequency, hematuria and urgency.  Musculoskeletal:  Negative for back pain, falls, joint pain, myalgias and neck pain.  Skin:  Negative for itching and rash.  Neurological:  Positive for tingling. Negative for dizziness, tremors, sensory change, speech change, focal weakness, seizures, loss of consciousness, weakness and headaches.  Endo/Heme/Allergies:  Negative for environmental allergies and polydipsia. Does not bruise/bleed easily.  Psychiatric/Behavioral:  Negative for depression, memory loss, substance abuse and suicidal ideas. The patient is not nervous/anxious and does not have insomnia.       Objective:     There were no vitals taken for this visit.   Physical Exam Vitals reviewed.  Constitutional:      Appearance: Normal appearance. He is well-developed. He is not diaphoretic.  HENT:     Head: Normocephalic and atraumatic.     Nose: No nasal deformity, septal deviation, mucosal edema or rhinorrhea.     Right Sinus: No maxillary sinus tenderness or frontal sinus tenderness.     Left Sinus: No maxillary  sinus tenderness or frontal sinus tenderness.     Mouth/Throat:     Pharynx: No oropharyngeal exudate.  Eyes:     General: No scleral icterus.    Conjunctiva/sclera: Conjunctivae normal.     Pupils: Pupils are equal, round, and reactive to light.  Neck:     Thyroid: No thyromegaly.     Vascular: No carotid bruit or JVD.     Trachea: Trachea normal. No tracheal tenderness or tracheal deviation.  Cardiovascular:     Rate and Rhythm: Normal rate and regular rhythm.     Chest Wall: PMI is not displaced.     Pulses: Normal pulses. No decreased pulses.     Heart sounds: Normal heart sounds, S1 normal and S2 normal. Heart sounds not distant. No murmur heard.    No systolic murmur is present.     No diastolic murmur is present.     No friction rub. No gallop. No S3 or  S4 sounds.  Pulmonary:     Effort: Pulmonary effort is normal. No tachypnea, accessory muscle usage or respiratory distress.     Breath sounds: No stridor. Wheezing present. No decreased breath sounds, rhonchi or rales.     Comments: Distant breath sounds Chest:     Chest wall: No tenderness.  Abdominal:     General: Bowel sounds are normal. There is no distension.     Palpations: Abdomen is soft. Abdomen is not rigid.     Tenderness: There is no abdominal tenderness. There is no guarding or rebound.  Musculoskeletal:        General: Normal range of motion.     Cervical back: Normal range of motion and neck supple. No edema, erythema or rigidity. No muscular tenderness. Normal range of motion.  Lymphadenopathy:     Head:     Right side of head: No submental or submandibular adenopathy.     Left side of head: No submental or submandibular adenopathy.     Cervical: No cervical adenopathy.  Skin:    General: Skin is warm and dry.     Coloration: Skin is not pale.     Findings: Rash present.     Nails: There is no clubbing.     Comments: Erythematous rash both forearms is improved  Groin rash compatible with tinea cruris  Neurological:     Mental Status: He is alert and oriented to person, place, and time.     Sensory: No sensory deficit.  Psychiatric:        Speech: Speech normal.        Behavior: Behavior normal.      No results found for any visits on 12/29/21.     The ASCVD Risk score (Arnett DK, et al., 2019) failed to calculate for the following reasons:   The patient has a prior MI or stroke diagnosis    Assessment & Plan:   Problem List Items Addressed This Visit   None No follow-ups on file.    Asencion Noble, MD

## 2021-12-29 ENCOUNTER — Ambulatory Visit: Payer: Medicaid Other | Attending: Critical Care Medicine | Admitting: Critical Care Medicine

## 2021-12-29 ENCOUNTER — Encounter: Payer: Self-pay | Admitting: Critical Care Medicine

## 2021-12-29 ENCOUNTER — Other Ambulatory Visit: Payer: Self-pay

## 2021-12-29 VITALS — BP 157/96 | HR 71 | Temp 97.7°F | Ht 69.0 in | Wt 206.0 lb

## 2021-12-29 DIAGNOSIS — J9621 Acute and chronic respiratory failure with hypoxia: Secondary | ICD-10-CM | POA: Diagnosis not present

## 2021-12-29 DIAGNOSIS — R1312 Dysphagia, oropharyngeal phase: Secondary | ICD-10-CM

## 2021-12-29 DIAGNOSIS — I10 Essential (primary) hypertension: Secondary | ICD-10-CM | POA: Diagnosis not present

## 2021-12-29 DIAGNOSIS — E119 Type 2 diabetes mellitus without complications: Secondary | ICD-10-CM | POA: Diagnosis not present

## 2021-12-29 DIAGNOSIS — I214 Non-ST elevation (NSTEMI) myocardial infarction: Secondary | ICD-10-CM | POA: Diagnosis not present

## 2021-12-29 DIAGNOSIS — B351 Tinea unguium: Secondary | ICD-10-CM | POA: Diagnosis not present

## 2021-12-29 DIAGNOSIS — H539 Unspecified visual disturbance: Secondary | ICD-10-CM

## 2021-12-29 DIAGNOSIS — J441 Chronic obstructive pulmonary disease with (acute) exacerbation: Secondary | ICD-10-CM

## 2021-12-29 DIAGNOSIS — J189 Pneumonia, unspecified organism: Secondary | ICD-10-CM

## 2021-12-29 DIAGNOSIS — J9622 Acute and chronic respiratory failure with hypercapnia: Secondary | ICD-10-CM

## 2021-12-29 DIAGNOSIS — Z87891 Personal history of nicotine dependence: Secondary | ICD-10-CM | POA: Diagnosis not present

## 2021-12-29 DIAGNOSIS — B356 Tinea cruris: Secondary | ICD-10-CM | POA: Diagnosis not present

## 2021-12-29 MED ORDER — PANTOPRAZOLE SODIUM 40 MG PO TBEC
40.0000 mg | DELAYED_RELEASE_TABLET | Freq: Every day | ORAL | 6 refills | Status: DC
  Filled 2021-12-29: qty 60, 60d supply, fill #0
  Filled 2021-12-29: qty 30, 30d supply, fill #0
  Filled 2022-03-03: qty 60, 60d supply, fill #1

## 2021-12-29 MED ORDER — FUROSEMIDE 40 MG PO TABS
40.0000 mg | ORAL_TABLET | Freq: Every day | ORAL | 1 refills | Status: DC
  Filled 2021-12-29 – 2022-01-05 (×3): qty 30, 30d supply, fill #0
  Filled 2022-02-04: qty 30, 30d supply, fill #1

## 2021-12-29 MED ORDER — NYSTATIN 100000 UNIT/GM EX CREA
TOPICAL_CREAM | Freq: Three times a day (TID) | CUTANEOUS | 2 refills | Status: DC
  Filled 2021-12-29: qty 30, 10d supply, fill #0
  Filled 2021-12-29: qty 30, 15d supply, fill #0
  Filled 2022-01-06: qty 30, 10d supply, fill #1

## 2021-12-29 MED ORDER — ALBUTEROL SULFATE HFA 108 (90 BASE) MCG/ACT IN AERS
2.0000 | INHALATION_SPRAY | RESPIRATORY_TRACT | 1 refills | Status: DC | PRN
  Filled 2021-12-29 (×2): qty 18, 17d supply, fill #0

## 2021-12-29 MED ORDER — LOSARTAN POTASSIUM 100 MG PO TABS
100.0000 mg | ORAL_TABLET | Freq: Every day | ORAL | 2 refills | Status: DC
  Filled 2021-12-29 (×2): qty 90, 90d supply, fill #0

## 2021-12-29 MED ORDER — FLUCONAZOLE 100 MG PO TABS
100.0000 mg | ORAL_TABLET | Freq: Every day | ORAL | 0 refills | Status: AC
  Filled 2021-12-29 (×2): qty 7, 7d supply, fill #0

## 2021-12-29 MED ORDER — PREGABALIN 100 MG PO CAPS
100.0000 mg | ORAL_CAPSULE | Freq: Two times a day (BID) | ORAL | 2 refills | Status: DC
  Filled 2021-12-29 (×2): qty 60, 30d supply, fill #0
  Filled 2022-02-04: qty 60, 30d supply, fill #1

## 2021-12-29 MED ORDER — ALBUTEROL SULFATE (2.5 MG/3ML) 0.083% IN NEBU
2.5000 mg | INHALATION_SOLUTION | RESPIRATORY_TRACT | 1 refills | Status: DC | PRN
  Filled 2021-12-29 (×2): qty 270, 15d supply, fill #0

## 2021-12-29 MED ORDER — PREDNISONE 10 MG PO TABS
10.0000 mg | ORAL_TABLET | Freq: Every day | ORAL | 6 refills | Status: DC
  Filled 2021-12-29 (×2): qty 30, 30d supply, fill #0

## 2021-12-29 MED ORDER — METOPROLOL SUCCINATE ER 50 MG PO TB24
50.0000 mg | ORAL_TABLET | Freq: Every day | ORAL | 2 refills | Status: DC
  Filled 2021-12-29 – 2022-01-05 (×3): qty 90, 90d supply, fill #0

## 2021-12-29 MED ORDER — GLIPIZIDE ER 5 MG PO TB24
5.0000 mg | ORAL_TABLET | Freq: Every day | ORAL | 1 refills | Status: DC
  Filled 2021-12-29 (×2): qty 60, 60d supply, fill #0

## 2021-12-29 NOTE — Patient Instructions (Signed)
Increase losartan to 100 mg daily  Take Diflucan 2 for 1 day then 1 daily till ends and stay on cream to groin refills given  No changes in other medications refills given  We will see if we can get EMT back out to check on you  An ophthalmology eye doctor appointment was made they will call you  A foot doctor appointment will be made they will call you  Return to [name] 2 months

## 2021-12-29 NOTE — Assessment & Plan Note (Signed)
Likely due to stress coronary arteries were examined during last hospitalization when were normal

## 2021-12-29 NOTE — Assessment & Plan Note (Signed)
Hypertension not well-controlled continue metoprolol for rate control and increase losartan 100 mg daily

## 2021-12-29 NOTE — Assessment & Plan Note (Signed)
COPD with exacerbation maintain prednisone

## 2021-12-29 NOTE — Assessment & Plan Note (Signed)
Referral to ophthalmology

## 2021-12-29 NOTE — Assessment & Plan Note (Signed)
Perform speech pathology modified barium swallow

## 2021-12-29 NOTE — Progress Notes (Unsigned)
Cardiology Clinic Note   Patient Name: James [name] Date of Encounter: 12/29/2021  Primary Care Provider:  [name], MD Primary Cardiologist:  Evalina Field, MD  Patient Profile    James [name] 61 year old male presents the clinic today for follow-up evaluation of his coronary artery disease and SVT.  Past Medical History    Past Medical History:  Diagnosis Date   Acute on chronic respiratory failure with hypoxia ([place]) 06/08/2013   CAP (community acquired pneumonia) 05/17/2018   See admit 05/17/18 ? rml  with   covid pcr neg - rx augmentin > f/u cxr in 4-6 weeks is fine unless condition declines    Community acquired pneumonia of right lower lobe of lung 05/17/2018   See admit 05/17/18 ? 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   Recurrent pneumonia 02/20/2021   Tick bite 06/03/2018   Past Surgical History:  Procedure Laterality Date   LEFT HEART CATH AND CORONARY ANGIOGRAPHY N/A 12/07/2021   Procedure: LEFT HEART CATH AND CORONARY ANGIOGRAPHY;  Surgeon: Lorretta Harp, MD;  Location: [facility];  Service: Cardiovascular;  Laterality: N/A;   LUNG REMOVAL, PARTIAL     VIDEO ASSISTED THORACOSCOPY (VATS)/THOROCOTOMY Left 06/11/2013   Procedure: VIDEO ASSISTED THORACOSCOPY (VATS)/THOROCOTOMY;  Surgeon: Ivin Poot, MD;  Location: [facility];  Service: Thoracic;  Laterality: Left;   VIDEO BRONCHOSCOPY N/A 06/11/2013   Procedure: VIDEO BRONCHOSCOPY;  Surgeon: Ivin Poot, MD;  Location: [facility];  Service: Thoracic;  Laterality: N/A;   VIDEO BRONCHOSCOPY  N/A 08/10/2018   Procedure: VIDEO BRONCHOSCOPY WITH FLUORO;  Surgeon: Candee Furbish, MD;  Location: [facility] ENDOSCOPY;  Service: Endoscopy;  Laterality: N/A;    Allergies  No Known Allergies  History of Present Illness    [name] has a PMH of COPD on home O2, HTN, HLD, type 2 diabetes, and EtOH abuse.  He was admitted for acute COPD exacerbation/respiratory failure 11/28/2021 and was also diagnosed with community acquired pneumonia at that time.  He required intubation.  Cardiology was consulted due to elevated troponin.  It was felt that his troponins were elevated due to chronic hypoxic respiratory failure secondary to COPD exacerbation CAP and rhinovirus.  His echocardiogram 1021 showed an EF of 60-65%, subsequent echocardiogram 1126 showed an EF of 50-55%.  He was noted to have coronary artery calcification on previous CT.  His troponins were noted to be 20, 467, 1676, 1951, and 1296.  He was felt to have possible SVT and was placed on IV Cardizem.  He developed hypotension.  He was transitioned to p.o. metoprolol succinate.  He underwent cardiac catheterization on 12/07/2021.  His ejection fraction was noted to be normal.  He was noted to have new anterolateral T wave inversion.  His catheterization showed clean coronary anatomy.  It was felt that he had a "type II" non-STEMI and medical management was recommended.  He was seen in follow-up by Dr. Radford Pax on 12/07/2021.  He had not had any further SVT.  His  metoprolol was continued.  Outpatient follow-up was planned in 4 weeks.  He was discharged on 12/08/2021 at 1451.  He was readmitted on 12/23/2021 with shortness of breath and he was suspected to have another bout of COPD exacerbation.  Chest x-ray was negative for pneumonia.  He was continued on his 3 L of home oxygen.  He presents to the clinic today for follow-up evaluation and states***  *** denies chest pain, shortness of breath, lower extremity edema, fatigue, palpitations, melena,  hematuria, hemoptysis, diaphoresis, weakness, presyncope, syncope, orthopnea, and PND.  NSTEMI-underwent cardiac catheterization on 12/07/2021 and was noted to have normal coronary anatomy.  Echocardiogram showed an EF of 50-55%. Continue metoprolol, losartan, Heart healthy low-sodium diet-salty 6 given Increase physical activity as tolerated  SVT-denies further episodes of accelerated or irregular heartbeat. Continue metoprolol Avoid triggers caffeine, chocolate, EtOH, dehydration etc.  Essential hypertension-BP today*** Continue metoprolol, losartan Heart healthy low-sodium diet-salty 6 given Increase physical activity as tolerated  COPD-denies any increased work of breathing.  Continues on 3 L O2. Continue current medical therapy Increase physical activity as tolerated Follows with PCP  Disposition: Follow-up with Dr. Audie Box in 4 to 6 months.  Home Medications    Prior to Admission medications   Medication Sig Start Date End Date Taking? Authorizing Provider  albuterol (PROVENTIL) (2.5 MG/3ML) 0.083% nebulizer solution Take 3 mLs (2.5 mg total) by nebulization every 4 (four) hours as needed for wheezing or shortness of breath. 12/10/21   [name], MD  albuterol (VENTOLIN HFA) 108 (90 Base) MCG/ACT inhaler Inhale 2 puffs into the lungs every 4 (four) hours as needed. Patient taking differently: Inhale 2 puffs into the lungs every 4 (four) hours as needed for wheezing or shortness of breath. 09/09/21   [name], MD  Budeson-Glycopyrrol-Formoterol (BREZTRI AEROSPHERE) 160-9-4.8 MCG/ACT AERO Inhale 2 puffs into the lungs in the morning and at bedtime. 10/14/21   Maryjane Hurter, MD  clobetasol cream (TEMOVATE) 2.33 % Apply 1 Application topically 2 (two) times daily. Patient taking differently: Apply 1 Application topically 2 (two) times daily as needed (rash). 09/09/21   [name], MD  clotrimazole-betamethasone (LOTRISONE) cream Apply 1 Application topically 2  (two) times daily to groin affected areas Patient taking differently: Apply 1 Application topically 2 (two) times daily as needed (rash). 09/09/21   [name], MD  furosemide (LASIX) 40 MG tablet Take 1 tablet (40 mg total) by mouth daily. 12/09/21 01/08/22  Donne Hazel, MD  glipiZIDE (GLUCOTROL XL) 5 MG 24 hr tablet Take 1 tablet (5 mg total) by mouth daily with breakfast. 06/29/21   [name], MD  losartan (COZAAR) 50 MG tablet Take 1 tablet (50 mg total) by mouth daily. 06/29/21   [name], MD  metoprolol succinate (TOPROL-XL) 50 MG 24 hr tablet Take 1 tablet (50 mg total) by mouth daily. Take with or immediately following a meal. 12/09/21 01/08/22  Donne Hazel, MD  nystatin cream (MYCOSTATIN) Apply topically 3 (three) times daily. Apply to affected areas 12/08/21   Donne Hazel, MD  pantoprazole (PROTONIX) 40 MG tablet Take 1 tablet (40 mg total) by mouth daily. 06/29/21   [name], MD  predniSONE (DELTASONE) 10 MG tablet Take 1 tablet (10 mg total) by mouth daily. 10/29/21   Linward Natal, MD  pregabalin (LYRICA) 100 MG capsule Take 1 capsule (100 mg total) by mouth 2 (two) times daily. 10/14/21   [name], MD  Vitamin D3 (  VITAMIN D) 25 MCG tablet Take 1 tablet by mouth daily. 07/14/21   [name], MD    Family History    Family History  Problem Relation Age of Onset   Heart disease Father    COPD Sister    He indicated that his mother is deceased. He indicated that his father is deceased. He indicated that the status of his sister is unknown.  Social History    Social History   Socioeconomic History   Marital status: Divorced    Spouse name: Not on file   Number of children: Not on file   Years of education: Not on file   Highest education level: Not on file  Occupational History   Occupation: Dealer   Occupation: Curator  Tobacco Use   Smoking status: Former    Packs/day: 1.50    Years: 40.00    Total pack years:  60.00    Types: Cigarettes    Quit date: 2014    Years since quitting: 9.9   Smokeless tobacco: Former    Types: Nurse, children's Use: Never used  Substance and Sexual Activity   Alcohol use: Not Currently    Alcohol/week: 28.0 standard drinks of alcohol    Types: 28 Cans of beer per week    Comment: 4 beers a night previously   Drug use: Not Currently    Types: Marijuana    Comment: daily; quit using crack and methamphetamine about 5 months ago    Sexual activity: Not Currently    Partners: Female  Other Topics Concern   Not on file  Social History Narrative   L[place] alone near [place] Determinants of Health   Financial Resource Strain: Low Risk  (12/25/2020)   Overall Financial Resource Strain (CARDIA)    Difficulty of Paying Living Expenses: Not very hard  Recent Concern: Financial Resource Strain - High Risk (10/29/2020)   Overall Financial Resource Strain (CARDIA)    Difficulty of Paying Living Expenses: Hard  Food Insecurity: No Food Insecurity (12/17/2021)   Hunger Vital Sign    Worried About Running Out of Food in the Last Year: Never true    [place] in the Last Year: Never true  Transportation Needs: No Transportation Needs (12/17/2021)   PRAPARE - Hydrologist (Medical): No    Lack of Transportation (Non-Medical): No  Physical Activity: Inactive (12/11/2020)   Exercise Vital Sign    Days of Exercise per Week: 0 days    Minutes of Exercise per Session: 0 min  Stress: No Stress Concern Present (12/25/2020)   [place]    Feeling of Stress : Not at all  Social Connections: Moderately Integrated (11/13/2020)   Social Connection and Isolation Panel [NHANES]    Frequency of Communication with Friends and Family: More than three times a week    Frequency of Social Gatherings with Friends and Family: Once a week    Attends Religious Services: 1  to 4 times per year    Active Member of [employer] or Organizations: Yes    Attends Archivist Meetings: 1 to 4 times per year    Marital Status: Divorced  Intimate Partner Violence: Not At Risk (12/03/2021)   Humiliation, Afraid, Rape, and Kick questionnaire    Fear of Current or Ex-Partner: No    Emotionally Abused: No    Physically Abused: No  Sexually Abused: No     Review of Systems    General:  No chills, fever, night sweats or weight changes.  Cardiovascular:  No chest pain, dyspnea on exertion, edema, orthopnea, palpitations, paroxysmal nocturnal dyspnea. Dermatological: No rash, lesions/masses Respiratory: No cough, dyspnea Urologic: No hematuria, dysuria Abdominal:   No nausea, vomiting, diarrhea, bright red blood per rectum, melena, or hematemesis Neurologic:  No visual changes, wkns, changes in mental status. All other systems reviewed and are otherwise negative except as noted above.  Physical Exam    VS:  There were no vitals taken for this visit. , BMI There is no height or weight on file to calculate BMI. GEN: Well nourished, well developed, in no acute distress. HEENT: normal. Neck: Supple, no JVD, carotid bruits, or masses. Cardiac: RRR, no murmurs, rubs, or gallops. No clubbing, cyanosis, edema.  Radials/DP/PT 2+ and equal bilaterally.  Respiratory:  Respirations regular and unlabored, clear to auscultation bilaterally. GI: Soft, nontender, nondistended, BS + x 4. MS: no deformity or atrophy. Skin: warm and dry, no rash. Neuro:  Strength and sensation are intact. Psych: Normal affect.  Accessory Clinical Findings    Recent Labs: 05/07/2021: TSH 0.231 11/28/2021: B Natriuretic Peptide 50.2 12/06/2021: Magnesium 2.1 12/07/2021: ALT 32 12/08/2021: BUN 32; Creatinine, Ser 0.88; Hemoglobin 12.8; Platelets 321; Potassium 3.7; Sodium 137   Recent Lipid Panel    Component Value Date/Time   TRIG 85 11/29/2021 0500    No BP recorded.  {Refresh Note OR  Click here to enter BP  :1}***    ECG personally reviewed by me today- *** - No acute changes  Echocardiogram 11/29/2021  IMPRESSIONS     1. Left ventricular ejection fraction, by estimation, is 50 to 55%. The  left ventricle has low normal function. The left ventricle has no regional  wall motion abnormalities. Left ventricular diastolic parameters are  consistent with Grade I diastolic  dysfunction (impaired relaxation).   2. Right ventricular systolic function is normal. The right ventricular  size is normal. Tricuspid regurgitation signal is inadequate for assessing  PA pressure.   3. The mitral valve is normal in structure. No evidence of mitral valve  regurgitation.   4. The aortic valve is grossly normal. Aortic valve regurgitation is not  visualized. No aortic stenosis is present.   5. The inferior vena cava is normal in size with greater than 50%  respiratory variability, suggesting right atrial pressure of 3 mmHg.   Comparison(s): Prior images reviewed side by side. The left ventricular  systolic function is worsened. but at the lower limit of normal.   FINDINGS   Left Ventricle: Left ventricular ejection fraction, by estimation, is 50  to 55%. The left ventricle has low normal function. The left ventricle has  no regional wall motion abnormalities. Definity contrast agent was given  IV to delineate the left  ventricular endocardial borders. The left ventricular internal cavity size  was normal in size. There is no left ventricular hypertrophy. Left  ventricular diastolic parameters are consistent with Grade I diastolic  dysfunction (impaired relaxation). Normal   left ventricular filling pressure.   Right Ventricle: The right ventricular size is normal. No increase in  right ventricular wall thickness. Right ventricular systolic function is  normal. Tricuspid regurgitation signal is inadequate for assessing PA  pressure.   Left Atrium: Left atrial size was  normal in size.   Right Atrium: Right atrial size was normal in size.   Pericardium: There is no evidence of pericardial  effusion. Presence of  epicardial fat layer.   Mitral Valve: The mitral valve is normal in structure. No evidence of  mitral valve regurgitation.   Tricuspid Valve: The tricuspid valve is grossly normal. Tricuspid valve  regurgitation is not demonstrated.   Aortic Valve: The aortic valve is grossly normal. Aortic valve  regurgitation is not visualized. No aortic stenosis is present. Aortic  valve mean gradient measures 3.0 mmHg. Aortic valve peak gradient measures  4.8 mmHg. Aortic valve area, by VTI measures  2.94 cm.   Pulmonic Valve: The pulmonic valve was not well visualized.   Aorta: The aortic root is normal in size and structure.   Venous: The inferior vena cava is normal in size with greater than 50%  respiratory variability, suggesting right atrial pressure of 3 mmHg.   IAS/Shunts: The interatrial septum was not well visualized.    Assessment & Plan   1.  ***   Jossie Ng. Sheleen Conchas NP-C     12/29/2021, 7:29 AM [place] [address] Office [phone] Fax [phone]    I spent***minutes examining this patient, reviewing medications, and using patient centered shared decision making involving her cardiac care.  Prior to her visit I spent greater than 20 minutes reviewing her past medical history,  medications, and prior cardiac tests.

## 2021-12-29 NOTE — Assessment & Plan Note (Signed)
Continue current medicines.

## 2021-12-29 NOTE — Assessment & Plan Note (Signed)
No longer smoking

## 2021-12-29 NOTE — Assessment & Plan Note (Signed)
Resolved

## 2021-12-29 NOTE — Assessment & Plan Note (Signed)
No additional antibiotics indicated check chest x-ray and blood count

## 2021-12-29 NOTE — Assessment & Plan Note (Signed)
Referral to podiatry  

## 2021-12-29 NOTE — Assessment & Plan Note (Signed)
Administer oral Diflucan

## 2021-12-30 ENCOUNTER — Telehealth: Payer: Self-pay

## 2021-12-30 LAB — COMPREHENSIVE METABOLIC PANEL
ALT: 15 IU/L (ref 0–44)
AST: 12 IU/L (ref 0–40)
Albumin/Globulin Ratio: 1.9 (ref 1.2–2.2)
Albumin: 4 g/dL (ref 3.8–4.9)
Alkaline Phosphatase: 59 IU/L (ref 44–121)
BUN/Creatinine Ratio: 14 (ref 10–24)
BUN: 12 mg/dL (ref 8–27)
Bilirubin Total: <0.2 mg/dL (ref 0.0–1.2)
CO2: 26 mmol/L (ref 20–29)
Calcium: 10 mg/dL (ref 8.6–10.2)
Chloride: 102 mmol/L (ref 96–106)
Creatinine, Ser: 0.86 mg/dL (ref 0.76–1.27)
Globulin, Total: 2.1 g/dL (ref 1.5–4.5)
Glucose: 121 mg/dL — ABNORMAL HIGH (ref 70–99)
Potassium: 4.4 mmol/L (ref 3.5–5.2)
Sodium: 142 mmol/L (ref 134–144)
Total Protein: 6.1 g/dL (ref 6.0–8.5)
eGFR: 99 mL/min/{1.73_m2} (ref >59)

## 2021-12-30 LAB — CBC WITH DIFFERENTIAL/PLATELET
Basophils Absolute: 0.1 10*3/uL (ref 0.0–0.2)
Basos: 1 %
EOS (ABSOLUTE): 0.3 10*3/uL (ref 0.0–0.4)
Eos: 2 %
Hematocrit: 39.2 % (ref 37.5–51.0)
Hemoglobin: 12.6 g/dL — ABNORMAL LOW (ref 13.0–17.7)
Immature Grans (Abs): 0 10*3/uL (ref 0.0–0.1)
Immature Granulocytes: 0 %
Lymphocytes Absolute: 1.3 10*3/uL (ref 0.7–3.1)
Lymphs: 12 %
MCH: 28.6 pg (ref 26.6–33.0)
MCHC: 32.1 g/dL (ref 31.5–35.7)
MCV: 89 fL (ref 79–97)
Monocytes Absolute: 0.8 10*3/uL (ref 0.1–0.9)
Monocytes: 7 %
Neutrophils Absolute: 8.4 10*3/uL — ABNORMAL HIGH (ref 1.4–7.0)
Neutrophils: 78 %
Platelets: 306 10*3/uL (ref 150–450)
RBC: 4.4 x10E6/uL (ref 4.14–5.80)
RDW: 12.9 % (ref 11.6–15.4)
WBC: 10.9 10*3/uL — ABNORMAL HIGH (ref 3.4–10.8)

## 2021-12-30 NOTE — Progress Notes (Signed)
Let patient know labs are improved from hospitalization stay on medications as prescribed let patient know he should stop by and get a chest x-ray it could be they were closed yesterday  A modified barium swallow scheduled by speech pathology will be obtained they will call him to make the appointment to assess his swallowing

## 2021-12-30 NOTE — Telephone Encounter (Signed)
Referral for community paramedicine services sent to [facility]

## 2021-12-31 ENCOUNTER — Emergency Department (HOSPITAL_COMMUNITY): Payer: Medicaid Other

## 2021-12-31 ENCOUNTER — Telehealth (INDEPENDENT_AMBULATORY_CARE_PROVIDER_SITE_OTHER): Payer: Self-pay

## 2021-12-31 ENCOUNTER — Other Ambulatory Visit: Payer: Self-pay

## 2021-12-31 ENCOUNTER — Emergency Department (HOSPITAL_COMMUNITY)
Admission: EM | Admit: 2021-12-31 | Discharge: 2021-12-31 | Disposition: A | Discharge status: Home / Self Care | Payer: Medicaid Other | Attending: Emergency Medicine | Admitting: Emergency Medicine

## 2021-12-31 DIAGNOSIS — J441 Chronic obstructive pulmonary disease with (acute) exacerbation: Secondary | ICD-10-CM | POA: Diagnosis not present

## 2021-12-31 DIAGNOSIS — J9621 Acute and chronic respiratory failure with hypoxia: Secondary | ICD-10-CM | POA: Diagnosis not present

## 2021-12-31 DIAGNOSIS — Z1152 Encounter for screening for COVID-19: Secondary | ICD-10-CM | POA: Diagnosis not present

## 2021-12-31 DIAGNOSIS — R0602 Shortness of breath: Secondary | ICD-10-CM | POA: Diagnosis not present

## 2021-12-31 DIAGNOSIS — J449 Chronic obstructive pulmonary disease, unspecified: Secondary | ICD-10-CM | POA: Diagnosis not present

## 2021-12-31 DIAGNOSIS — F79 Unspecified intellectual disabilities: Secondary | ICD-10-CM | POA: Diagnosis not present

## 2021-12-31 DIAGNOSIS — J189 Pneumonia, unspecified organism: Secondary | ICD-10-CM | POA: Diagnosis not present

## 2021-12-31 DIAGNOSIS — I491 Atrial premature depolarization: Secondary | ICD-10-CM | POA: Diagnosis not present

## 2021-12-31 DIAGNOSIS — R000 Tachycardia, unspecified: Secondary | ICD-10-CM | POA: Diagnosis not present

## 2021-12-31 DIAGNOSIS — J9601 Acute respiratory failure with hypoxia: Secondary | ICD-10-CM | POA: Diagnosis not present

## 2021-12-31 DIAGNOSIS — I959 Hypotension, unspecified: Secondary | ICD-10-CM | POA: Diagnosis not present

## 2021-12-31 LAB — BASIC METABOLIC PANEL
Anion gap: 8 (ref 5–15)
BUN: 8 mg/dL (ref 6–20)
CO2: 26 mmol/L (ref 22–32)
Calcium: 7.5 mg/dL — ABNORMAL LOW (ref 8.9–10.3)
Chloride: 109 mmol/L (ref 98–111)
Creatinine, Ser: 0.76 mg/dL (ref 0.61–1.24)
GFR, Estimated: >60 mL/min (ref >60)
Glucose, Bld: 116 mg/dL — ABNORMAL HIGH (ref 70–99)
Potassium: 3 mmol/L — ABNORMAL LOW (ref 3.5–5.1)
Sodium: 143 mmol/L (ref 135–145)

## 2021-12-31 LAB — CBC WITH DIFFERENTIAL/PLATELET
Abs Immature Granulocytes: 0.02 10*3/uL (ref 0.00–0.07)
Basophils Absolute: 0 10*3/uL (ref 0.0–0.1)
Basophils Relative: 0 %
Eosinophils Absolute: 0.2 10*3/uL (ref 0.0–0.5)
Eosinophils Relative: 2 %
HCT: 33.9 % — ABNORMAL LOW (ref 39.0–52.0)
Hemoglobin: 10.6 g/dL — ABNORMAL LOW (ref 13.0–17.0)
Immature Granulocytes: 0 %
Lymphocytes Relative: 14 %
Lymphs Abs: 1.3 10*3/uL (ref 0.7–4.0)
MCH: 29.8 pg (ref 26.0–34.0)
MCHC: 31.3 g/dL (ref 30.0–36.0)
MCV: 95.2 fL (ref 80.0–100.0)
Monocytes Absolute: 0.9 10*3/uL (ref 0.1–1.0)
Monocytes Relative: 9 %
Neutro Abs: 6.8 10*3/uL (ref 1.7–7.7)
Neutrophils Relative %: 75 %
Platelets: 229 10*3/uL (ref 150–400)
RBC: 3.56 MIL/uL — ABNORMAL LOW (ref 4.22–5.81)
RDW: 14.2 % (ref 11.5–15.5)
WBC: 9.1 10*3/uL (ref 4.0–10.5)
nRBC: 0 % (ref 0.0–0.2)

## 2021-12-31 LAB — RESP PANEL BY RT-PCR (RSV, FLU A&B, COVID)  RVPGX2
Influenza A by PCR: NEGATIVE
Influenza B by PCR: NEGATIVE
Resp Syncytial Virus by PCR: NEGATIVE
SARS Coronavirus 2 by RT PCR: NEGATIVE

## 2021-12-31 LAB — BRAIN NATRIURETIC PEPTIDE: B Natriuretic Peptide: 103.9 pg/mL — ABNORMAL HIGH (ref 0.0–100.0)

## 2021-12-31 LAB — TROPONIN I (HIGH SENSITIVITY): Troponin I (High Sensitivity): 16 ng/L (ref <18)

## 2021-12-31 MED ORDER — METHYLPREDNISOLONE SODIUM SUCC 125 MG IJ SOLR
125.0000 mg | Freq: Once | INTRAMUSCULAR | Status: AC
  Administered 2021-12-31: 125 mg via INTRAVENOUS
  Filled 2021-12-31: qty 2

## 2021-12-31 MED ORDER — AZITHROMYCIN 250 MG PO TABS: 250.0000 mg | ORAL_TABLET | Freq: Every day | ORAL | 0 refills | Status: DC

## 2021-12-31 MED ORDER — IPRATROPIUM-ALBUTEROL 0.5-2.5 (3) MG/3ML IN SOLN
3.0000 mL | Freq: Once | RESPIRATORY_TRACT | Status: AC
  Administered 2021-12-31: 3 mL via RESPIRATORY_TRACT
  Filled 2021-12-31: qty 3

## 2021-12-31 MED ORDER — PREDNISONE 20 MG PO TABS: 60.0000 mg | ORAL_TABLET | Freq: Every day | ORAL | 0 refills | Status: AC

## 2021-12-31 NOTE — ED Provider Notes (Signed)
[facility] EMERGENCY DEPARTMENT Provider Note   CSN: [id number] Arrival date & time: 12/31/21  9629     History  Chief Complaint  Patient presents with   Shortness of Breath    James [name] is a 61 y.o. male.  Patient here with shortness of breath.  History of COPD on 3 L of oxygen at baseline.  Had recent hospital stay for pneumonia he states where he was intubated.  He has been having some shortness of breath and cough for the last few days.  Denies any fevers or chills or sputum production.  No chest pain.  No swelling on his legs.  Nothing has made it worse or better.  Has been trying his breathing treatments.  The history is provided by the patient.       Home Medications Prior to Admission medications   Medication Sig Start Date End Date Taking? Authorizing Provider  azithromycin (ZITHROMAX) 250 MG tablet Take 1 tablet (250 mg total) by mouth daily. Take first 2 tablets together, then 1 every day until finished. 12/31/21  Yes Doreena Maulden, DO  predniSONE (DELTASONE) 20 MG tablet Take 3 tablets (60 mg total) by mouth daily for 5 days. 12/31/21 01/05/22 Yes Jkai Arwood, DO  albuterol (PROVENTIL) (2.5 MG/3ML) 0.083% nebulizer solution Take 3 mLs (2.5 mg total) by nebulization every 4 (four) hours as needed for wheezing or shortness of breath. 12/29/21   [name], MD  albuterol (VENTOLIN HFA) 108 (90 Base) MCG/ACT inhaler Inhale 2 puffs into the lungs every 4 (four) hours as needed. 12/29/21   [name], MD  Budeson-Glycopyrrol-Formoterol (BREZTRI AEROSPHERE) 160-9-4.8 MCG/ACT AERO Inhale 2 puffs into the lungs in the morning and at bedtime. 10/14/21   Maryjane Hurter, MD  fluconazole (DIFLUCAN) 100 MG tablet Take 1 tablet (100 mg total) by mouth daily for 7 days. 12/29/21 01/05/22  [name], MD  furosemide (LASIX) 40 MG tablet Take 1 tablet (40 mg total) by mouth daily. 12/29/21 01/28/22  [name], MD  glipiZIDE  (GLUCOTROL XL) 5 MG 24 hr tablet Take 1 tablet (5 mg total) by mouth daily with breakfast. 12/29/21   [name], MD  losartan (COZAAR) 100 MG tablet Take 1 tablet (100 mg total) by mouth daily. 12/29/21   [name], MD  metoprolol succinate (TOPROL-XL) 50 MG 24 hr tablet Take 1 tablet (50 mg total) by mouth daily. Take with or immediately following a meal. 12/29/21 01/28/22  [name], MD  nystatin cream (MYCOSTATIN) Apply to affected areas 3 (three) times daily. 12/29/21   [name], MD  pantoprazole (PROTONIX) 40 MG tablet Take 1 tablet (40 mg total) by mouth daily. 12/29/21   [name], MD  pregabalin (LYRICA) 100 MG capsule Take 1 capsule (100 mg total) by mouth 2 (two) times daily. 12/29/21   [name], MD  Vitamin D3 (VITAMIN D) 25 MCG tablet Take 1 tablet by mouth daily. 07/14/21   [name], MD      Allergies    Patient has no known allergies.    Review of Systems   Review of Systems  Physical Exam Updated Vital Signs BP 114/78   Pulse 73   Temp 97.8 F (36.6 C) (Oral)   Resp (!) 27   Ht 5\' 9"  (1.753 m)   Wt 93.4 kg   SpO2 96%   BMI 30.42 kg/m  Physical Exam Vitals and nursing note reviewed.  Constitutional:      General: He is not in acute distress.    Appearance: He is well-developed.  HENT:     Head: Normocephalic and atraumatic.     Mouth/Throat:     Mouth: Mucous membranes are moist.  Eyes:     Conjunctiva/sclera: Conjunctivae normal.     Pupils: Pupils are equal, round, and reactive to light.  Cardiovascular:     Rate and Rhythm: Normal rate and regular rhythm.     Pulses: Normal pulses.     Heart sounds: Normal heart sounds. No murmur heard. Pulmonary:     Effort: Pulmonary effort is normal. No respiratory distress.     Breath sounds: Wheezing present.  Abdominal:     Palpations: Abdomen is soft.     Tenderness: There is no abdominal tenderness.  Musculoskeletal:        General: No swelling.      Cervical back: Normal range of motion and neck supple.  Skin:    General: Skin is warm and dry.     Capillary Refill: Capillary refill takes less than 2 seconds.  Neurological:     General: No focal deficit present.     Mental Status: He is alert.  Psychiatric:        Mood and Affect: Mood normal.     ED Results / Procedures / Treatments   Labs (all labs ordered are listed, but only abnormal results are displayed) Labs Reviewed  CBC WITH DIFFERENTIAL/PLATELET - Abnormal; Notable for the following components:      Result Value   RBC 3.56 (*)    Hemoglobin 10.6 (*)    HCT 33.9 (*)    All other components within normal limits  BASIC METABOLIC PANEL - Abnormal; Notable for the following components:   Potassium 3.0 (*)    Glucose, Bld 116 (*)    Calcium 7.5 (*)    All other components within normal limits  BRAIN NATRIURETIC PEPTIDE - Abnormal; Notable for the following components:   B Natriuretic Peptide 103.9 (*)    All other components within normal limits  RESP PANEL BY RT-PCR (RSV, FLU A&B, COVID)  RVPGX2  TROPONIN I (HIGH SENSITIVITY)    EKG EKG Interpretation  Date/Time:  Thursday December 31 2021 09:54:49 EST Ventricular Rate:  88 PR Interval:  180 QRS Duration: 96 QT Interval:  374 QTC Calculation: 453 R Axis:   -2 Text Interpretation: Sinus rhythm Atrial premature complexes Anteroseptal infarct, old Confirmed by Lennice Sites [phone]) on 12/31/2021 9:59:12 AM  Radiology DG Chest Portable 1 View  Result Date: 12/31/2021 CLINICAL DATA:  Short of breath.  COPD. EXAM: PORTABLE CHEST 1 VIEW COMPARISON:  Chest 11/30/2021 FINDINGS: Cardiac and mediastinal contours normal. Interval clearing of right lower lobe infiltrate. Improvement left lower lobe airspace disease with mild residual atelectasis or scarring in the left lung base. Mild blunting of left costophrenic angle likely due to scarring or small effusion Chronic bilateral rib fractures left greater than right.  Chronic left clavicle fracture. IMPRESSION: 1. Interval clearing of right lower lobe infiltrate. 2. Improvement in left lower lobe airspace disease with mild residual atelectasis or scarring. Electronically Signed   By: Franchot Gallo M.D.   On: 12/31/2021 10:13    Procedures Procedures    Medications Ordered in ED Medications  ipratropium-albuterol (DUONEB) 0.5-2.5 (3) MG/3ML nebulizer solution 3 mL (3 mLs Nebulization Given 12/31/21 1035)  methylPREDNISolone sodium succinate (SOLU-MEDROL) 125 mg/2 mL injection 125 mg (125 mg Intravenous Given 12/31/21 1035)  ED Course/ Medical Decision Making/ A&P                           Medical Decision Making Amount and/or Complexity of Data Reviewed Labs: ordered. Radiology: ordered.  Risk Prescription drug management.   DAMETRI OZBURN is here with shortness of breath.  History of COPD on 3 L of oxygen.  Recent complicated hospital stay.  Normal vitals.  No fever.  He is on his 3 L of oxygen.  Differential diagnosis is COPD exacerbation versus pneumonia versus viral process.  He has an EKG that shows sinus rhythm.  No ischemic changes.  Seems less likely to be heart failure or ACS.  He did have a heart cath per my chart review during hospitalization few weeks ago that showed clean coronaries despite elevated troponin.  Will recheck CBC, BMP, troponin and BNP.  Will give breathing treatment and Solu-Medrol and reevaluate.  Per my review interpretation of labs is no significant anemia or electrolyte abnormality or kidney injury.  BNP and troponin are within normal limits.  COVID and flu test are negative.  Chest x-ray shows no evidence of pneumonia.  Overall suspect COPD exacerbation.  Will treat with prednisone and Z-Pak.  Patient very comfortable on exam.  No new oxygen requirement.  Understands return precautions.  Discharged in good condition.  This chart was dictated using voice recognition software.  Despite best efforts to proofread,  errors  can occur which can change the documentation meaning.         Final Clinical Impression(s) / ED Diagnoses Final diagnoses:  COPD exacerbation ([place])    Rx / DC Orders ED Discharge Orders          Ordered    predniSONE (DELTASONE) 20 MG tablet  Daily        12/31/21 1314    azithromycin (ZITHROMAX) 250 MG tablet  Daily        12/31/21 1314              Kadia Abaya, [place]a Skye, DO 12/31/21 1315

## 2021-12-31 NOTE — Telephone Encounter (Signed)
Pt was called and is aware of results, DOB was confirmed.  ?

## 2021-12-31 NOTE — Telephone Encounter (Signed)
-----   Message from [name], MD sent at 12/30/2021  5:57 AM EST ----- Let patient know labs are improved from hospitalization stay on medications as prescribed let patient know he should stop by and get a chest x-ray it could be they were closed yesterday  A modified barium swallow scheduled by speech pathology will be obtained they will call him to make the appointment to assess his swallowing

## 2021-12-31 NOTE — Progress Notes (Signed)
CSW spoke with patient who stated he does not have an oxygen tank to get home and has no one to bring him a tank from home. Patient stated he receives his oxygen from [employer]. CSW contacted Cyril Mourning with [employer] who stated they will deliver a tank for patient to get home at bedside.

## 2021-12-31 NOTE — ED Triage Notes (Signed)
Pt recently intubated at [facility] for PNA, Reports feeling the same as he did last time he had PNA. Increasing Sob over two days. Coughing spells making him dizzy. SOB upon exertion.  Hx COPD  Baseline 3 LPM

## 2021-12-31 NOTE — Discharge Instructions (Addendum)
Overall suspect you have a COPD exacerbation.  Continue breathing treatments as needed at home.  Take next dose of steroid tomorrow.  I will also prescribed you an antibiotic to help with inflammation as well.  But you had no evidence of pneumonia on your chest x-ray.  Your blood work today was normal.

## 2022-01-04 DIAGNOSIS — J9601 Acute respiratory failure with hypoxia: Secondary | ICD-10-CM | POA: Diagnosis not present

## 2022-01-04 DIAGNOSIS — F79 Unspecified intellectual disabilities: Secondary | ICD-10-CM | POA: Diagnosis not present

## 2022-01-04 DIAGNOSIS — J9621 Acute and chronic respiratory failure with hypoxia: Secondary | ICD-10-CM | POA: Diagnosis not present

## 2022-01-05 ENCOUNTER — Other Ambulatory Visit: Payer: Self-pay

## 2022-01-05 ENCOUNTER — Telehealth: Payer: Self-pay | Admitting: *Deleted

## 2022-01-05 NOTE — Patient Outreach (Signed)
RNCM returning call to Mr. Sterkel. Mr. Alarid concerned about transportation details for appointment on 01/07/22. RNCM previously assisted with transportation arrangements and reviewed details with Mr. Agustin. RNCM assisted with arranging transportation to appointments on 01/12/22 and 01/27/22, details are in [place]. Mr. Barren is aware of details and voiced understanding. RNCM will reach out to patient during scheduled visit on 01/18/22   [name] RN, [place] RN Care Coordinator

## 2022-01-06 ENCOUNTER — Other Ambulatory Visit (HOSPITAL_COMMUNITY): Payer: Self-pay

## 2022-01-06 ENCOUNTER — Other Ambulatory Visit: Payer: Self-pay

## 2022-01-06 DIAGNOSIS — J9621 Acute and chronic respiratory failure with hypoxia: Secondary | ICD-10-CM | POA: Diagnosis not present

## 2022-01-06 DIAGNOSIS — F79 Unspecified intellectual disabilities: Secondary | ICD-10-CM | POA: Diagnosis not present

## 2022-01-06 DIAGNOSIS — J9601 Acute respiratory failure with hypoxia: Secondary | ICD-10-CM | POA: Diagnosis not present

## 2022-01-06 NOTE — Progress Notes (Signed)
Paramedicine Encounter    Patient ID: James [name], male    DOB: 1960-05-18, 62 y.o.   MRN: [id number]  Pt was re-referred to paramedicine due to frequent readmissions recently due to his COPD/pneumonia.  ER last week and admission back in November.   He reports he feels better. He was given antibiotic. He did complete his antibiotic course. Took last tablet this morning.  I explained to him to be sure when he gets the antibiotics to complete the whole course of treatment.   Reviewed meds with him-  Pregabalin-he said he takes both capsules at same time-I advised him the instructions were differently-he said it helps him better in the AM with both capsules and he doesn't take any at bedtime- he said he sleeps fine at night time. I did advise him to tell his doctor this.  Reviewed his other meds, explained to why he is taking each of them.   He asked for me to help him go over his glucometer again for use.  He has all supplies needed.   He said right now his truck is in the shop-he gets to appointments via medicaid txp right now.   He does have his SSI  He gets his meds mailed out. Meds are $4 each. Has medicaid.   Got him a wall calendar to write down all his meds.   He is living at home.  The heat he uses is like electric fire places in the rooms.   He denies missing any doses of his meds-he said he used to but not anymore.   He denies any issues obtaining food.   Will f/u either next week or following week.   [facility]-127   BP (!) 140/78   Pulse (!) 56   Resp (!) 22   SpO2 98%    Patient Care Team: [name], MD as PCP - General (Pulmonary Disease) O'Neal, Cassie Freer, MD as PCP - Cardiology (Cardiology) [name], RN as Case Manager Greg Cutter, LCSW as [employer] (Licensed Clinical Social Worker)  Patient Active Problem List   Diagnosis Date Noted   Vision changes 12/29/2021   Oropharyngeal dysphagia 12/29/2021    Onychomycosis 12/29/2021   Malnutrition of moderate degree 12/05/2021   Respiratory failure ([place]) 11/28/2021   Tinea cruris 07/21/2021   Community acquired pneumonia 07/12/2021   Multiple lung nodules on CT 06/11/2021   COPD with chronic bronchitis 02/01/2021   GERD (gastroesophageal reflux disease) 02/01/2021   Neuropathy 09/29/2020   Bronchiectasis ([place]) 09/24/2019   Type 2 diabetes mellitus without complication, without long-term current use of insulin ([place]) 07/24/2019   Other atopic dermatitis 05/01/2019   Chronic respiratory failure with hypoxia ([place]) 04/23/2019   Intellectual disability 04/23/2019   Alcohol use disorder 11/23/2018   COPD exacerbation ([place]) 02/16/2018   Hypertension 05/17/2016   Former tobacco use 04/06/2016   COPD mixed type ([place]) 03/26/2016    Current Outpatient Medications:    albuterol (PROVENTIL) (2.5 MG/3ML) 0.083% nebulizer solution, Take 3 mLs (2.5 mg total) by nebulization every 4 (four) hours as needed for wheezing or shortness of breath., Disp: 270 mL, Rfl: 1   albuterol (VENTOLIN HFA) 108 (90 Base) MCG/ACT inhaler, Inhale 2 puffs into the lungs every 4 (four) hours as needed., Disp: 18 g, Rfl: 1   azithromycin (ZITHROMAX) 250 MG tablet, Take 1 tablet (250 mg total) by mouth daily. Take first 2 tablets together, then 1 every day until finished., Disp: 6  tablet, Rfl: 0   Budeson-Glycopyrrol-Formoterol (BREZTRI AEROSPHERE) 160-9-4.8 MCG/ACT AERO, Inhale 2 puffs into the lungs in the morning and at bedtime., Disp: 10.7 g, Rfl: 11   furosemide (LASIX) 40 MG tablet, Take 1 tablet (40 mg total) by mouth daily., Disp: 30 tablet, Rfl: 1   glipiZIDE (GLUCOTROL XL) 5 MG 24 hr tablet, Take 1 tablet (5 mg total) by mouth daily with breakfast., Disp: 60 tablet, Rfl: 1   losartan (COZAAR) 100 MG tablet, Take 1 tablet (100 mg total) by mouth daily., Disp: 90 tablet, Rfl: 2   metoprolol succinate (TOPROL-XL) 50 MG 24 hr tablet, Take 1 tablet (50 mg total) by mouth  daily. Take with or immediately following a meal., Disp: 90 tablet, Rfl: 2   pantoprazole (PROTONIX) 40 MG tablet, Take 1 tablet (40 mg total) by mouth daily., Disp: 60 tablet, Rfl: 6   pregabalin (LYRICA) 100 MG capsule, Take 1 capsule (100 mg total) by mouth 2 (two) times daily., Disp: 60 capsule, Rfl: 2   Vitamin D3 (VITAMIN D) 25 MCG tablet, Take 1 tablet by mouth daily., Disp: 60 tablet, Rfl: 2   nystatin cream (MYCOSTATIN), Apply to affected areas 3 (three) times daily., Disp: 30 g, Rfl: 2 No Known Allergies    Social History   Socioeconomic History   Marital status: Divorced    Spouse name: Not on file   Number of children: Not on file   Years of education: Not on file   Highest education level: Not on file  Occupational History   Occupation: Dealer   Occupation: Curator  Tobacco Use   Smoking status: Former    Packs/day: 1.50    Years: 40.00    Total pack years: 60.00    Types: Cigarettes    Quit date: 2014    Years since quitting: 10.0   Smokeless tobacco: Former    Types: Nurse, children's Use: Never used  Substance and Sexual Activity   Alcohol use: Not Currently    Alcohol/week: 28.0 standard drinks of alcohol    Types: 28 Cans of beer per week    Comment: 4 beers a night previously   Drug use: Not Currently    Types: Marijuana    Comment: daily; quit using crack and methamphetamine about 5 months ago    Sexual activity: Not Currently    Partners: Female  Other Topics Concern   Not on file  Social History Narrative   Lives alone near [place] Determinants of Health   Financial Resource Strain: [place]  (12/25/2020)   Overall Financial Resource Strain (CARDIA)    Difficulty of Paying Living Expenses: Not very hard  Recent Concern: Financial Resource Strain - High Risk (10/29/2020)   Overall Financial Resource Strain (CARDIA)    Difficulty of Paying Living Expenses: Hard  Food Insecurity: No Food Insecurity (12/17/2021)   Hunger  Vital Sign    Worried About Running Out of Food in the Last Year: Never true    [place] in the Last Year: Never true  Transportation Needs: Unmet Transportation Needs (01/05/2022)   PRAPARE - Transportation    Lack of Transportation (Medical): Yes    Lack of Transportation (Non-Medical): Yes  Physical Activity: Inactive (12/11/2020)   Exercise Vital Sign    Days of Exercise per Week: 0 days    Minutes of Exercise per Session: 0 min  Stress: No Stress Concern Present (12/25/2020)   [place] -  Occupational Stress Questionnaire    Feeling of Stress : Not at all  Social Connections: Moderately Integrated (11/13/2020)   Social Connection and Isolation Panel [NHANES]    Frequency of Communication with Friends and Family: More than three times a week    Frequency of Social Gatherings with Friends and Family: Once a week    Attends Religious Services: 1 to 4 times per year    Active Member of [employer] or Organizations: Yes    Attends Archivist Meetings: 1 to 4 times per year    Marital Status: Divorced  Intimate Partner Violence: Not At Risk (12/03/2021)   Humiliation, Afraid, Rape, and Kick questionnaire    Fear of Current or Ex-Partner: No    Emotionally Abused: No    Physically Abused: No    Sexually Abused: No    Physical Exam      Future Appointments  Date Time Provider [place]  01/07/2022  8:25 AM Deberah Pelton, NP [facility] None  01/12/2022 10:30 AM Lorenda Peck, DPM [facility]  01/18/2022 10:30 AM [name], RN [facility] None  01/27/2022  2:00 PM [facility] None  01/27/2022  3:00 PM Icard, Octavio Graves, DO [facility] None  03/02/2022  9:30 AM [name], MD [facility] None  03/04/2022  8:50 AM Argentina Donovan, PA-C [facility] None       [name], [place] Paramedic  01/06/22

## 2022-01-07 ENCOUNTER — Encounter: Payer: Self-pay | Admitting: General Practice

## 2022-01-07 ENCOUNTER — Ambulatory Visit: Payer: Medicaid Other | Attending: General Practice | Admitting: General Practice

## 2022-01-07 VITALS — BP 138/56 | HR 82 | Ht 69.0 in | Wt 205.0 lb

## 2022-01-07 DIAGNOSIS — I471 Supraventricular tachycardia, unspecified: Secondary | ICD-10-CM

## 2022-01-07 DIAGNOSIS — I214 Non-ST elevation (NSTEMI) myocardial infarction: Secondary | ICD-10-CM

## 2022-01-07 DIAGNOSIS — I10 Essential (primary) hypertension: Secondary | ICD-10-CM

## 2022-01-07 DIAGNOSIS — J449 Chronic obstructive pulmonary disease, unspecified: Secondary | ICD-10-CM

## 2022-01-07 NOTE — Patient Instructions (Signed)
Medication Instructions:  The current medical regimen is effective;  continue present plan and medications as directed. Please refer to the Current Medication list given to you today.  *If you need a refill on your cardiac medications before your next appointment, please call your pharmacy*  Lab Work: NONE If you have labs (blood work) drawn today and your tests are completely normal, you will receive your results only by: [place] (if you have MyChart) OR  A paper copy in the mail If you have any lab test that is abnormal or we need to change your treatment, we will call you to review the results.  Testing/Procedures: NONE  Follow-Up: At [facility], you and your health needs are our priority.  As part of our continuing mission to provide you with exceptional heart care, we have created designated Provider Care Teams.  These Care Teams include your primary Cardiologist (physician) and Advanced Practice Providers (APPs -  Physician Assistants and Nurse Practitioners) who all work together to provide you with the care you need, when you need it.  Your next appointment:   4-6 month(s)  The format for your next appointment:   In Person  Provider:   Evalina Field, MD    Other Instructions  PLEASE READ AND FOLLOW ATTACHED  SALTY 6    PLEASE [place]  Important Information About Sugar         Low salt diet DASH stands for Dietary Approaches to Stop Hypertension. The DASH eating plan is a healthy eating plan that has been shown to: Reduce high blood pressure (hypertension). Reduce your risk for type 2 diabetes, heart disease, and stroke. Help with weight loss. What are tips for following this plan? Reading food labels Check food labels for the amount of salt (sodium) per serving. Choose foods with less than 5 percent of the Daily Value of sodium. Generally, foods with less than  300 milligrams (mg) of sodium per serving fit into this eating plan. To find whole grains, look for the word "whole" as the first word in the ingredient list. Shopping Buy products labeled as "low-sodium" or "no salt added." Buy fresh foods. Avoid canned foods and pre-made or frozen meals. Cooking Avoid adding salt when cooking. Use salt-free seasonings or herbs instead of table salt or sea salt. Check with your health care provider or pharmacist before using salt substitutes. Do not fry foods. Cook foods using healthy methods such as baking, boiling, grilling, roasting, and broiling instead. Cook with heart-healthy oils, such as olive, canola, avocado, soybean, or sunflower oil. Meal planning  Eat a balanced diet that includes: 4 or more servings of fruits and 4 or more servings of vegetables each day. Try to fill one-half of your plate with fruits and vegetables. 6-8 servings of whole grains each day. Less than 6 oz (170 g) of lean meat, poultry, or fish each day. A 3-oz (85-g) serving of meat is about the same size as a deck of cards. One egg equals 1 oz (28 g). 2-3 servings of low-fat dairy each day. One serving is 1 cup (237 mL). 1 serving of nuts, seeds, or beans 5 times each week. 2-3 servings of heart-healthy fats. Healthy fats called omega-3 fatty acids are found in foods such as walnuts, flaxseeds, fortified milks, and eggs. These fats are also found in cold-water fish, such as sardines, salmon, and mackerel. Limit how much you eat  of: Canned or prepackaged foods. Food that is high in trans fat, such as some fried foods. Food that is high in saturated fat, such as fatty meat. Desserts and other sweets, sugary drinks, and other foods with added sugar. Full-fat dairy products. Do not salt foods before eating. Do not eat more than 4 egg yolks a week. Try to eat at least 2 vegetarian meals a week. Eat more home-cooked food and less restaurant, buffet, and fast food. Lifestyle When  eating at a restaurant, ask that your food be prepared with less salt or no salt, if possible. If you drink alcohol: Limit how much you use to: 0-1 drink a day for women who are not pregnant. 0-2 drinks a day for men. Be aware of how much alcohol is in your drink. In the [place]., one drink equals one 12 oz bottle of beer (355 mL), one 5 oz glass of wine (148 mL), or one 1 oz glass of hard liquor (44 mL). General information Avoid eating more than 2,300 mg of salt a day. If you have hypertension, you may need to reduce your sodium intake to 1,500 mg a day. Work with your health care provider to maintain a healthy body weight or to lose weight. Ask what an ideal weight is for you. Get at least 30 minutes of exercise that causes your heart to beat faster (aerobic exercise) most days of the week. Activities may include walking, swimming, or biking. Work with your health care provider or dietitian to adjust your eating plan to your individual calorie needs. What foods should I eat? Fruits All fresh, dried, or frozen fruit. Canned fruit in natural juice (without added sugar). Vegetables Fresh or frozen vegetables (raw, steamed, roasted, or grilled). Low-sodium or reduced-sodium tomato and vegetable juice. Low-sodium or reduced-sodium tomato sauce and tomato paste. Low-sodium or reduced-sodium canned vegetables. Grains Whole-grain or whole-wheat bread. Whole-grain or whole-wheat pasta. Brown rice. [name]. Bulgur. Whole-grain and low-sodium cereals. Pita bread. Low-fat, low-sodium crackers. Whole-wheat flour tortillas. Meats and other proteins Skinless chicken or [place]. Ground chicken or [place]. Pork with fat trimmed off. Fish and seafood. Egg whites. Dried beans, peas, or lentils. Unsalted nuts, nut butters, and seeds. Unsalted canned beans. Lean cuts of beef with fat trimmed off. Low-sodium, lean precooked or cured meat, such as sausages or meat loaves. Dairy Low-fat (1%) or fat-free (skim)  milk. Reduced-fat, low-fat, or fat-free cheeses. Nonfat, low-sodium ricotta or cottage cheese. Low-fat or nonfat yogurt. Low-fat, low-sodium cheese. Fats and oils Soft margarine without trans fats. Vegetable oil. Reduced-fat, low-fat, or light mayonnaise and salad dressings (reduced-sodium). Canola, safflower, olive, avocado, soybean, and sunflower oils. Avocado. Seasonings and condiments Herbs. Spices. Seasoning mixes without salt. Other foods Unsalted popcorn and pretzels. Fat-free sweets. The items listed above may not be a complete list of foods and beverages you can eat. Contact a dietitian for more information. What foods should I avoid? Fruits Canned fruit in a light or heavy syrup. Fried fruit. Fruit in cream or butter sauce. Vegetables Creamed or fried vegetables. Vegetables in a cheese sauce. Regular canned vegetables (not low-sodium or reduced-sodium). Regular canned tomato sauce and paste (not low-sodium or reduced-sodium). Regular tomato and vegetable juice (not low-sodium or reduced-sodium). Angie Fava. Olives. Grains Baked goods made with fat, such as croissants, muffins, or some breads. Dry pasta or rice meal packs. Meats and other proteins Fatty cuts of meat. Ribs. Fried meat. Berniece Salines. Bologna, salami, and other precooked or cured meats, such as sausages or meat loaves. Fat  from the back of a pig (fatback). Bratwurst. Salted nuts and seeds. Canned beans with added salt. Canned or smoked fish. Whole eggs or egg yolks. Chicken or [place] with skin. Dairy Whole or 2% milk, cream, and half-and-half. Whole or full-fat cream cheese. Whole-fat or sweetened yogurt. Full-fat cheese. Nondairy creamers. Whipped toppings. Processed cheese and cheese spreads. Fats and oils Butter. Stick margarine. Lard. Shortening. Ghee. Bacon fat. Tropical oils, such as coconut, palm kernel, or palm oil. Seasonings and condiments Onion salt, garlic salt, seasoned salt, table salt, and sea salt. Worcestershire  sauce. Tartar sauce. Barbecue sauce. Teriyaki sauce. Soy sauce, including reduced-sodium. Steak sauce. Canned and packaged gravies. Fish sauce. Oyster sauce. Cocktail sauce. Store-bought horseradish. Ketchup. Mustard. Meat flavorings and tenderizers. Bouillon cubes. Hot sauces. Pre-made or packaged marinades. Pre-made or packaged taco seasonings. Relishes. Regular salad dressings. Other foods Salted popcorn and pretzels. The items listed above may not be a complete list of foods and beverages you should avoid. Contact a dietitian for more information. Where to find more information National Heart, Lung, and Blood Institute: https://wilson-eaton.com/ American Heart Association: www.heart.org Academy of Nutrition and Dietetics: www.eatright.[place]: www.kidney.org Summary The DASH eating plan is a healthy eating plan that has been shown to reduce high blood pressure (hypertension). It may also reduce your risk for type 2 diabetes, heart disease, and stroke. When on the DASH eating plan, aim to eat more fresh fruits and vegetables, whole grains, lean proteins, low-fat dairy, and heart-healthy fats. With the DASH eating plan, you should limit salt (sodium) intake to 2,300 mg a day. If you have hypertension, you may need to reduce your sodium intake to 1,500 mg a day. Work with your health care provider or dietitian to adjust your eating plan to your individual calorie needs. This information is not intended to replace advice given to you by your health care provider. Make sure you discuss any questions you have with your health care provider. Document Revised: 11/24/2018 Document Reviewed: 11/24/2018 Elsevier Patient Education  [place]. Steps to Quit Smoking Smoking tobacco is the leading cause of preventable death. It can affect almost every organ in the body. Smoking puts you and people around you at risk for many serious, long-lasting (chronic) diseases. Quitting smoking  can be hard, but it is one of the best things that you can do for your health. It is never too late to quit. Do not give up if you cannot quit the first time. Some people need to try many times to quit. Do your best to stick to your quit plan, and talk with your doctor if you have any questions or concerns. How do I get ready to quit? Pick a date to quit. Set a date within the next 2 weeks to give you time to prepare. Write down the reasons why you are quitting. Keep this list in places where you will see it often. Tell your family, friends, and co-workers that you are quitting. Their support is important. Talk with your doctor about the choices that may help you quit. Find out if your health insurance will pay for these treatments. Know the people, places, things, and activities that make you want to smoke (triggers). Avoid them. What first steps can I take to quit smoking? Throw away all cigarettes at home, at work, and in your car. Throw away the things that you use when you smoke, such as ashtrays and lighters. Clean your car. Empty the ashtray. Clean your home, including curtains  and carpets. What can I do to help me quit smoking? Talk with your doctor about taking medicines and seeing a counselor. You are more likely to succeed when you do both. If you are pregnant or breastfeeding: Talk with your doctor about counseling or other ways to quit smoking. Do not take medicine to help you quit smoking unless your doctor tells you to. Quit right away Quit smoking completely, instead of slowly cutting back on how much you smoke over a period of time. Stopping smoking right away may be more successful than slowly quitting. Go to counseling. In-person is best if this is an option. You are more likely to quit if you go to counseling sessions regularly. Take medicine You may take medicines to help you quit. Some medicines need a prescription, and some you can buy over-the-counter. Some medicines  may contain a drug called nicotine to replace the nicotine in cigarettes. Medicines may: Help you stop having the desire to smoke (cravings). Help to stop the problems that come when you stop smoking (withdrawal symptoms). Your doctor may ask you to use: Nicotine patches, gum, or lozenges. Nicotine inhalers or sprays. Non-nicotine medicine that you take by mouth. Find resources Find resources and other ways to help you quit smoking and remain smoke-free after you quit. They include: Online chats with a Social worker. Phone quitlines. Printed Furniture conservator/restorer. Support groups or group counseling. Text messaging programs. Mobile phone apps. Use apps on your mobile phone or tablet that can help you stick to your quit plan. Examples of free services include Quit Guide from the CDC and smokefree.gov  What can I do to make it easier to quit?  Talk to your family and friends. Ask them to support and encourage you. Call a phone quitline, such as 1-800-QUIT-NOW, reach out to support groups, or work with a Social worker. Ask people who smoke to not smoke around you. Avoid places that make you want to smoke, such as: Bars. Parties. Smoke-break areas at work. Spend time with people who do not smoke. Lower the stress in your life. Stress can make you want to smoke. Try these things to lower stress: Getting regular exercise. Doing deep-breathing exercises. Doing yoga. Meditating. What benefits will I see if I quit smoking? Over time, you may have: A better sense of smell and taste. Less coughing and sore throat. A slower heart rate. Lower blood pressure. Clearer skin. Better breathing. Fewer sick days. Summary Quitting smoking can be hard, but it is one of the best things that you can do for your health. Do not give up if you cannot quit the first time. Some people need to try many times to quit. When you decide to quit smoking, make a plan to help you succeed. Quit smoking right away, not  slowly over a period of time. When you start quitting, get help and support to keep you smoke-free. This information is not intended to replace advice given to you by your health care provider. Make sure you discuss any questions you have with your health care provider. Document Revised: 12/12/2020 Document Reviewed: 12/12/2020 Elsevier Patient Education  [place].

## 2022-01-12 ENCOUNTER — Encounter: Payer: Self-pay | Admitting: Podiatry

## 2022-01-12 ENCOUNTER — Ambulatory Visit: Payer: Medicaid Other | Admitting: Podiatry

## 2022-01-12 VITALS — BP 130/68 | HR 80

## 2022-01-12 DIAGNOSIS — M79675 Pain in left toe(s): Secondary | ICD-10-CM

## 2022-01-12 DIAGNOSIS — B351 Tinea unguium: Secondary | ICD-10-CM

## 2022-01-12 DIAGNOSIS — M79674 Pain in right toe(s): Secondary | ICD-10-CM

## 2022-01-12 DIAGNOSIS — E119 Type 2 diabetes mellitus without complications: Secondary | ICD-10-CM

## 2022-01-12 NOTE — Progress Notes (Signed)
  Subjective:  Patient ID: James [name], male    DOB: Jun 10, 1960,   MRN: [id number]  Chief Complaint  Patient presents with   Ingrown Toenail    Patient is here for bilateral toe nail fungus, and right great toe ingrown, patient is diabetic and has hypertension.    62 y.o. male presents for concern of thickened elongated and painful nails that are difficult to trim. Requesting to have them trimmed today. Relates burning and tingling in their feet. Patient is diabetic and last A1c was  Lab Results  Component Value Date   HGBA1C 6.5 (H) 10/12/2021   .   PCP:  [name], MD    . Denies any other pedal complaints. Denies n/v/f/c.   Past Medical History:  Diagnosis Date   Acute on chronic respiratory failure with hypoxia ([place]) 06/08/2013   Acute on chronic respiratory failure with hypoxia and hypercapnia (HCC) 06/08/2013   CAP (community acquired pneumonia) 05/17/2018   See admit 05/17/18 ? rml  with   covid pcr neg - rx augmentin > f/u cxr in 4-6 weeks is fine unless condition declines    Community acquired pneumonia of right lower lobe of lung 05/17/2018   See admit 05/17/18 ? 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 CFT <3 seconds. Absent hair growth on digits. Edema noted to bilateral lower extremities. Xerosis noted bilaterally.  Skin. No lacerations or abrasions bilateral feet.  Nails 1-5 bilateral  are thickened discolored and elongated with subungual debris. Small abrasion to plantar left foot. No erythema edema or purulence noted.  Musculoskeletal: MMT 5/5 bilateral lower extremities in DF, PF, Inversion and Eversion. Deceased ROM in DF of ankle joint.  Neurological: Sensation intact to light touch. Protective sensation diminished bilateral.    Assessment:   1. Pain due to onychomycosis of toenails of both feet   2. Type 2 diabetes mellitus without complication, without long-term current use of insulin ([place])      Plan:  Patient was evaluated and treated and all questions answered. -Discussed and educated patient on diabetic foot care, especially with  regards to the vascular, neurological and musculoskeletal systems.  -Stressed the importance of good glycemic control and the detriment of not  controlling glucose levels in relation to the foot. -Discussed supportive shoes at all times and checking feet regularly.  -Mechanically debrided all nails 1-5 bilateral using sterile nail nipper and filed with dremel without incident  -Curently using OTC fungal medicine that helps will continue.  -Keep bandaid and neosporin on abraised area until healed.  -Answered all patient questions -Patient to return  in 3 months for at risk foot care -Patient advised to call the office if any problems or questions arise in the meantime.   Lorenda Peck, DPM

## 2022-01-18 ENCOUNTER — Other Ambulatory Visit: Payer: Self-pay

## 2022-01-18 ENCOUNTER — Other Ambulatory Visit: Payer: Medicaid Other | Admitting: *Deleted

## 2022-01-18 ENCOUNTER — Encounter: Payer: Self-pay | Admitting: *Deleted

## 2022-01-18 NOTE — Patient Instructions (Signed)
Visit Information  James [name] was given information about Medicaid Managed Care team care coordination services as a part of their Healthy [facility] Medicaid benefit. [name] verbally consented to engagement with the [facility] Managed Care team.   If you are experiencing a medical emergency, please call 911 or report to your local emergency department or urgent care.   If you have a non-emergency medical problem during routine business hours, please contact your provider's office and ask to speak with a nurse.   For questions related to your Healthy [facility] health plan, please call: [phone] or visit the homepage here: [email or web address]  If you would like to schedule transportation through your Healthy [facility] plan, please call the following number at least 2 days in advance of your appointment: [phone]  For information about your ride after you set it up, call Ride Assist at [phone]. Use this number to activate a Will Call pickup, or if your transportation is late for a scheduled pickup. Use this number, too, if you need to make a change or cancel a previously scheduled reservation.  If you need transportation services right away, call [phone]. The after-hours call center is staffed 24 hours to handle ride assistance and urgent reservation requests (including discharges) 365 days a year. Urgent trips include sick visits, hospital discharge requests and life-sustaining treatment.  Call the [place] at [phone], at any time, 24 hours a day, 7 days a week. If you are in danger or need immediate medical attention call 911.  If you would like help to quit smoking, call 1-800-QUIT-NOW 516-450-5886) OR Espaol: 1-855-Djelo-Ya (4-742-595-6387) o para ms informacin haga clic aqu or Text READY to 200-400 to register via text  [name],   Please see education materials related to COPD provided as print  materials.   The patient verbalized understanding of instructions, educational materials, and care plan provided today and agreed to receive a mailed copy of patient instructions, educational materials, and care plan.   Telephone follow up appointment with Managed Medicaid care management team member scheduled for:02/17/22 @ [place] RN, BSN [name] RN Care Coordinator   Following is a copy of your plan of care:  Care Plan : RN Care Manager Plan of Care  Updates made by [name], RN since 01/18/2022 12:00 AM     Problem: Chronic Disease Management needs in patient with COPD   Priority: High     Long-Range Goal: Plan of care established for for Chronic Disease management for patient with COPD   Start Date: 11/13/2020  Expected End Date: 02/16/2022  Priority: High  Note:   Current Barriers:  Care Coordination needs related to Limited access to food  Chronic Disease Management support and education needs related to COPD Mr. [name] reports feeling better. He denies any need for utility or food assistance. He is needing a refill on Breztri inhaler.  RNCM Clinical Goal(s):  Patient will verbalize understanding of plan for management of COPD as evidenced by taking all medications as prescribed, attending all scheduled appointments and contacting provider with questions and concerns take all medications exactly as prescribed and will call provider for medication related questions as evidenced by refilling medications and taking as prescribed    attend all scheduled medical appointments: 01/27/22 with Pulmonology as evidenced by provider notes in EMR        demonstrate improved adherence to prescribed treatment plan for COPD as evidenced by taking all medications  as prescribed, attending all scheduled appointments and contacting provider with questions and concerns continue to work with RN Care Manager and/or Social Worker to address care  management and care coordination needs related to COPD as evidenced by adherence to CM Team Scheduled appointments     through collaboration with RN Care manager, provider, and care team.   Interventions: Inter-disciplinary care team collaboration (see longitudinal plan of care) Evaluation of current treatment plan related to  self management and patient's adherence to plan as established by provider   SDOH Barriers (Status:  Goal on track:  Yes. Patient interviewed and SDOH assessment performed        SDOH Interventions    Flowsheet Row Patient Outreach Telephone from 01/18/2022 in [place] POPULATION HEALTH DEPARTMENT Telephone from 01/05/2022 in [place] POPULATION HEALTH DEPARTMENT Patient Outreach Telephone from 12/17/2021 in [place] POPULATION HEALTH DEPARTMENT ED to Hosp-Admission (Discharged) from 11/28/2021 in [place] LONG 4TH FLOOR PROGRESSIVE CARE AND UROLOGY Patient Outreach Telephone from 09/09/2021 in [facility] Community Care Coordination Patient Outreach Telephone from 07/08/2021 in [facility] Care Coordination  SDOH Interventions        Food Insecurity Interventions Intervention Not Indicated -- Intervention Not Indicated -- -- --  Housing Interventions -- -- -- Intervention Not Indicated Intervention Not Indicated --  Transportation Interventions -- Other (Comment)  [assisted with arranging transportation to appointments on 01/12/22 and 01/27/22] Other (Comment)  [RNCM assisted with arranging transportation to upcoming appointments] -- -- Other (Comment)  [Assisted with arranging transportation with [employer] medical transporation and having medications delivered]  Utilities Interventions Intervention Not Indicated -- -- -- -- --  Alcohol Usage Interventions -- -- -- -- Intervention Not Indicated (Score <7) --       Patient interviewed and appropriate assessments performed Discussed plans with patient for ongoing care management follow  up and provided patient with direct contact information for care management team- Assisted with arranging transportation to  Pulmonology on 01/27/22 pick up time 1-1:30pm and return pick up 4-4:40pm   COPD: (Status: Goal on Track (progressing): YES.) Long Term Goal  Reviewed medications with patient, including use of prescribed maintenance and rescue inhalers, and provided instruction on medication management and the importance of adherence Advised patient to self assesses COPD action plan zone and make appointment with provider if in the yellow zone for 48 hours without improvement Provided education about and advised patient to utilize infection prevention strategies to reduce risk of respiratory infection Discussed the importance of adequate rest and management of fatigue with COPD Advised patient to avoid going out in the cold, cover mouth with a scarf when going out Infection prevention reviewed Encouraged patient to continue to avoid drinking alcohol and avoid smoke Assisted with requesting Breztri inhaler, to be delivered, copay to charge account   Patient Goals/Self-Care Activities: Take medications as prescribed   Attend all scheduled provider appointments Call pharmacy for medication refills 3-7 days in advance of running out of medications Perform all self care activities independently  Call provider office for new concerns or questions  - avoid second hand smoke - limit outdoor activity during cold weather - keep follow-up appointments:  01/27/22 with Pulmonology - eat healthy/prescribed diet: DASH - get at least 7 to 8 hours of sleep at night

## 2022-01-18 NOTE — Patient Outreach (Signed)
Medicaid Managed Care   Nurse Care Manager Note  01/18/2022 Name:  James [name] MRN:  [id number] DOB:  23-Jul-1960  James [name] is an 62 y.o. year old male who is a primary patient of Delford Field Charlcie Cradle, MD.  The [facility] Managed Care Coordination team was consulted for assistance with:    COPD  James [name] was given information about Medicaid Managed Care Coordination team services today. James [name] Patient agreed to services and verbal consent obtained.  Engaged with patient by telephone for follow up visit in response to provider referral for case management and/or care coordination services.   Assessments/Interventions:  Review of past medical history, allergies, medications, health status, including review of consultants reports, laboratory and other test data, was performed as part of comprehensive evaluation and provision of chronic care management services.  SDOH (Social Determinants of Health) assessments and interventions performed: SDOH Interventions    Flowsheet Row Patient Outreach Telephone from 01/18/2022 in [place] POPULATION HEALTH DEPARTMENT Telephone from 01/05/2022 in [place] POPULATION HEALTH DEPARTMENT Patient Outreach Telephone from 12/17/2021 in [place] POPULATION HEALTH DEPARTMENT ED to Hosp-Admission (Discharged) from 11/28/2021 in [place] LONG 4TH FLOOR PROGRESSIVE CARE AND UROLOGY Patient Outreach Telephone from 09/09/2021 in [facility] Community Care Coordination Patient Outreach Telephone from 07/08/2021 in [facility] Care Coordination  SDOH Interventions        Food Insecurity Interventions Intervention Not Indicated -- Intervention Not Indicated -- -- --  Housing Interventions -- -- -- Intervention Not Indicated Intervention Not Indicated --  Transportation Interventions -- Other (Comment)  [assisted with arranging transportation to appointments on 01/12/22 and 01/27/22] Other (Comment)  [RNCM assisted with arranging  transportation to upcoming appointments] -- -- Other (Comment)  [Assisted with arranging transportation with [employer] medical transporation and having medications delivered]  Utilities Interventions Intervention Not Indicated -- -- -- -- --  Alcohol Usage Interventions -- -- -- -- Intervention Not Indicated (Score <7) --       Care Plan  No Known Allergies  Medications Reviewed Today     Reviewed by [name], RN (Registered Nurse) on 01/18/22 at 1042  Med List Status: <None>   Medication Order Taking? Sig Documenting Provider Last Dose Status Informant  albuterol (PROVENTIL) (2.5 MG/3ML) 0.083% nebulizer solution [id number] Yes Take 3 mLs (2.5 mg total) by nebulization every 4 (four) hours as needed for wheezing or shortness of breath. [name], MD Taking Active   albuterol (VENTOLIN HFA) 108 [phone] Base) MCG/ACT inhaler [id number] Yes Inhale 2 puffs into the lungs every 4 (four) hours as needed. [name], MD Taking Active   azithromycin [facility]) 250 MG tablet [id number] No Take 1 tablet (250 mg total) by mouth daily. Take first 2 tablets together, then 1 every day until finished.  Patient not taking: Reported on 01/18/2022   Virgina Norfolk, DO Not Taking Active            Med Note [name], Prabhav Faulkenberry A   Mon Jan 18, 2022 10:36 AM) completed  Budeson-Glycopyrrol-Formoterol (BREZTRI AEROSPHERE) 160-9-4.8 MCG/ACT AERO [id number] Yes Inhale 2 puffs into the lungs in the morning and at bedtime. Omar Person, MD Taking Active Pharmacy Records           Med Note (Dayna Geurts A   Wed Nov 18, 2021  3:30 PM)    furosemide (LASIX) 40 MG tablet [id number] Yes Take 1 tablet (40 mg total) by mouth daily. [name], MD Taking Active  glipiZIDE (GLUCOTROL XL) 5 MG 24 hr tablet [id number] Yes Take 1 tablet (5 mg total) by mouth daily with breakfast. [name], MD Taking Active   losartan (COZAAR) 100 MG tablet [id number] Yes Take 1 tablet (100 mg total) by mouth  daily. [name], MD Taking Active   metoprolol succinate (TOPROL-XL) 50 MG 24 hr tablet [id number] Yes Take 1 tablet (50 mg total) by mouth daily. Take with or immediately following a meal. [name], MD Taking Active   nystatin cream (MYCOSTATIN) [id number] Yes Apply to affected areas 3 (three) times daily. [name], MD Taking Active   pantoprazole (PROTONIX) 40 MG tablet [id number] Yes Take 1 tablet (40 mg total) by mouth daily. [name], MD Taking Active   pregabalin (LYRICA) 100 MG capsule [id number] Yes Take 1 capsule (100 mg total) by mouth 2 (two) times daily. [name], MD Taking Active   Vitamin D3 (VITAMIN D) 25 MCG tablet [id number] Yes Take 1 tablet by mouth daily. Steffanie Rainwater, MD Taking Active Pharmacy Records           Med Note Darrick Meigs   Tue Oct 13, 2021 10:31 AM)              Patient Active Problem List   Diagnosis Date Noted   Vision changes 12/29/2021   Oropharyngeal dysphagia 12/29/2021   Onychomycosis 12/29/2021   Malnutrition of moderate degree 12/05/2021   Respiratory failure (HCC) 11/28/2021   Tinea cruris 07/21/2021   Community acquired pneumonia 07/12/2021   Multiple lung nodules on CT 06/11/2021   COPD with chronic bronchitis 02/01/2021   GERD (gastroesophageal reflux disease) 02/01/2021   Neuropathy 09/29/2020   Bronchiectasis (HCC) 09/24/2019   Type 2 diabetes mellitus without complication, without long-term current use of insulin (HCC) 07/24/2019   Other atopic dermatitis 05/01/2019   Chronic respiratory failure with hypoxia (HCC) 04/23/2019   Intellectual disability 04/23/2019   Alcohol use disorder 11/23/2018   COPD exacerbation (HCC) 02/16/2018   Hypertension 05/17/2016   Former tobacco use 04/06/2016   COPD mixed type (HCC) 03/26/2016    Conditions to be addressed/monitored per PCP order:  COPD  Care Plan : RN Care Manager Plan of Care  Updates made by [name], RN since  01/18/2022 12:00 AM     Problem: Chronic Disease Management needs in patient with COPD   Priority: High     Long-Range Goal: Plan of care established for for Chronic Disease management for patient with COPD   Start Date: 11/13/2020  Expected End Date: 02/16/2022  Priority: High  Note:   Current Barriers:  Care Coordination needs related to Limited access to food  Chronic Disease Management support and education needs related to COPD James [name] reports feeling better. He denies any need for utility or food assistance. He is needing a refill on Breztri inhaler.  RNCM Clinical Goal(s):  Patient will verbalize understanding of plan for management of COPD as evidenced by taking all medications as prescribed, attending all scheduled appointments and contacting provider with questions and concerns take all medications exactly as prescribed and will call provider for medication related questions as evidenced by refilling medications and taking as prescribed    attend all scheduled medical appointments: 01/27/22 with Pulmonology as evidenced by provider notes in EMR        demonstrate improved adherence to prescribed treatment plan for COPD as evidenced by taking all medications as prescribed, attending all scheduled appointments and contacting  provider with questions and concerns continue to work with RN Care Manager and/or Social Worker to address care management and care coordination needs related to COPD as evidenced by adherence to CM Team Scheduled appointments     through collaboration with RN Care manager, provider, and care team.   Interventions: Inter-disciplinary care team collaboration (see longitudinal plan of care) Evaluation of current treatment plan related to  self management and patient's adherence to plan as established by provider   SDOH Barriers (Status:  Goal on track:  Yes. Patient interviewed and SDOH assessment performed        SDOH Interventions    Flowsheet Row  Patient Outreach Telephone from 01/18/2022 in [place] POPULATION HEALTH DEPARTMENT Telephone from 01/05/2022 in [place] POPULATION HEALTH DEPARTMENT Patient Outreach Telephone from 12/17/2021 in [place] POPULATION HEALTH DEPARTMENT ED to Hosp-Admission (Discharged) from 11/28/2021 in [place] LONG 4TH FLOOR PROGRESSIVE CARE AND UROLOGY Patient Outreach Telephone from 09/09/2021 in [facility] Community Care Coordination Patient Outreach Telephone from 07/08/2021 in [facility] Care Coordination  SDOH Interventions        Food Insecurity Interventions Intervention Not Indicated -- Intervention Not Indicated -- -- --  Housing Interventions -- -- -- Intervention Not Indicated Intervention Not Indicated --  Transportation Interventions -- Other (Comment)  [assisted with arranging transportation to appointments on 01/12/22 and 01/27/22] Other (Comment)  [RNCM assisted with arranging transportation to upcoming appointments] -- -- Other (Comment)  [Assisted with arranging transportation with [employer] medical transporation and having medications delivered]  Utilities Interventions Intervention Not Indicated -- -- -- -- --  Alcohol Usage Interventions -- -- -- -- Intervention Not Indicated (Score <7) --       Patient interviewed and appropriate assessments performed Discussed plans with patient for ongoing care management follow up and provided patient with direct contact information for care management team- Assisted with arranging transportation to  Pulmonology on 01/27/22 pick up time 1-1:30pm and return pick up 4-4:40pm   COPD: (Status: Goal on Track (progressing): YES.) Long Term Goal  Reviewed medications with patient, including use of prescribed maintenance and rescue inhalers, and provided instruction on medication management and the importance of adherence Advised patient to self assesses COPD action plan zone and make appointment with provider if in the  yellow zone for 48 hours without improvement Provided education about and advised patient to utilize infection prevention strategies to reduce risk of respiratory infection Discussed the importance of adequate rest and management of fatigue with COPD Advised patient to avoid going out in the cold, cover mouth with a scarf when going out Infection prevention reviewed Encouraged patient to continue to avoid drinking alcohol and avoid smoke Assisted with requesting Breztri inhaler, to be delivered, copay to charge account   Patient Goals/Self-Care Activities: Take medications as prescribed   Attend all scheduled provider appointments Call pharmacy for medication refills 3-7 days in advance of running out of medications Perform all self care activities independently  Call provider office for new concerns or questions  - avoid second hand smoke - limit outdoor activity during cold weather - keep follow-up appointments:  01/27/22 with Pulmonology - eat healthy/prescribed diet: DASH - get at least 7 to 8 hours of sleep at night       Follow Up:  Patient agrees to Care Plan and Follow-up.  Plan: The Managed Medicaid care management team will reach out to the patient again over the next 30 days.  Date/time of next scheduled RN care management/care  coordination outreach:  02/17/22 at 9am  [name] RN, BSN   [facility] RN Care Coordinator

## 2022-01-27 ENCOUNTER — Encounter: Payer: Self-pay | Admitting: Internal Medicine

## 2022-01-27 ENCOUNTER — Ambulatory Visit (INDEPENDENT_AMBULATORY_CARE_PROVIDER_SITE_OTHER): Payer: Medicaid Other | Admitting: Emergency Medicine

## 2022-01-27 ENCOUNTER — Ambulatory Visit: Payer: Medicaid Other | Admitting: Pulmonary Disease

## 2022-01-27 ENCOUNTER — Encounter: Payer: Self-pay | Admitting: Pulmonary Disease

## 2022-01-27 VITALS — BP 130/70 | HR 77 | Ht 69.0 in | Wt 206.0 lb

## 2022-01-27 DIAGNOSIS — J449 Chronic obstructive pulmonary disease, unspecified: Secondary | ICD-10-CM | POA: Diagnosis not present

## 2022-01-27 DIAGNOSIS — J432 Centrilobular emphysema: Secondary | ICD-10-CM | POA: Diagnosis not present

## 2022-01-27 DIAGNOSIS — R918 Other nonspecific abnormal finding of lung field: Secondary | ICD-10-CM

## 2022-01-27 LAB — PULMONARY FUNCTION TEST
DL/VA % pred: 92 %
DL/VA: 3.92 ml/min/mmHg/L
DLCO cor % pred: 72 %
DLCO cor: 19.47 ml/min/mmHg
DLCO unc % pred: 62 %
DLCO unc: 16.85 ml/min/mmHg
FEF 25-75 Post: 0.62 L/sec
FEF 25-75 Pre: 0.73 L/sec
FEF2575-%Change-Post: -15 %
FEF2575-%Pred-Post: 21 %
FEF2575-%Pred-Pre: 25 %
FEV1-%Change-Post: -5 %
FEV1-%Pred-Post: 43 %
FEV1-%Pred-Pre: 46 %
FEV1-Post: 1.51 L
FEV1-Pre: 1.6 L
FEV1FVC-%Change-Post: -3 %
FEV1FVC-%Pred-Pre: 71 %
FEV6-%Change-Post: -2 %
FEV6-%Pred-Post: 64 %
FEV6-%Pred-Pre: 66 %
FEV6-Post: 2.82 L
FEV6-Pre: 2.91 L
FEV6FVC-%Change-Post: -1 %
FEV6FVC-%Pred-Post: 102 %
FEV6FVC-%Pred-Pre: 103 %
FVC-%Change-Post: -1 %
FVC-%Pred-Post: 62 %
FVC-%Pred-Pre: 64 %
FVC-Post: 2.9 L
FVC-Pre: 2.95 L
Post FEV1/FVC ratio: 52 %
Post FEV6/FVC ratio: 97 %
Pre FEV1/FVC ratio: 54 %
Pre FEV6/FVC Ratio: 99 %
RV % pred: 157 %
RV: 3.45 L
TLC % pred: 97 %
TLC: 6.64 L

## 2022-01-27 NOTE — Progress Notes (Signed)
Full PFT performed today.

## 2022-01-27 NOTE — Patient Instructions (Signed)
Full PFT performed today.

## 2022-01-27 NOTE — Progress Notes (Signed)
Subjective:   PATIENT ID: James [name] GENDER: male DOB: [birth year], MRN: [id number]  Chief Complaint  Patient presents with   Follow-up    F/up PFT    James [name] is a 62 y.o. M with pertinent PMH of COPD on Trelegy and albuterol with several exacerbations over the last year, HTN, T2DM, atopic dermatitis who presents today as a new patient referral for lung nodules noted on imaging during recent hospitalization for CAP.   [name] describes a smoking history of 2 PPD for 20 years starting at age 4. He quit smoking in 2014 after an accident at work. 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 His breathing at this time is the same as it has been for several years and he has grown accustomed to pursed-lip breathing. He has had several hospitalizations over the last year for pneumonia which he states did not provide him much relief overall. He states that he is doing well with Trelegy daily but using his PRN albuterol inhalor up to 4 times per day. Breathing is worse in hot weather but not positional. He can walk up and down a hall before having to stop to catch his breath. He denies wheezing. He endorses increased abdominal girth but no other signs over edema or overload.  He reports that his father did have cancer though he does not know which kind. Past Medical History:  Diagnosis Date   Acute on chronic respiratory failure with hypoxia ([place]) 06/08/2013   Acute on chronic respiratory failure with hypoxia and hypercapnia ([facility]) 06/08/2013   CAP (community acquired pneumonia) 05/17/2018   See admit 05/17/18 ? rml  with   covid pcr neg - rx augmentin > f/u cxr in 4-6 weeks is fine unless condition declines    Community acquired pneumonia of right lower lobe of lung 05/17/2018   See admit 05/17/18 ? 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   Recurrent pneumonia 02/20/2021   Tick bite 06/03/2018    Family History  Problem Relation Age of Onset   Heart disease Father    COPD Sister    Past Surgical History:  Procedure Laterality Date   LEFT HEART CATH AND CORONARY ANGIOGRAPHY N/A 12/07/2021   Procedure: LEFT HEART CATH AND CORONARY ANGIOGRAPHY;  Surgeon: Lorretta Harp, MD;  Location: [facility];  Service: Cardiovascular;  Laterality: N/A;   LUNG REMOVAL, PARTIAL     VIDEO ASSISTED THORACOSCOPY (VATS)/THOROCOTOMY Left 06/11/2013   Procedure: VIDEO ASSISTED THORACOSCOPY (VATS)/THOROCOTOMY;  Surgeon: Ivin Poot, MD;  Location: [facility];  Service: Thoracic;  Laterality: Left;   VIDEO BRONCHOSCOPY N/A 06/11/2013   Procedure: VIDEO BRONCHOSCOPY;  Surgeon: Ivin Poot, MD;  Location: [facility];  Service: Thoracic;  Laterality: N/A;   VIDEO BRONCHOSCOPY N/A 08/10/2018   Procedure: VIDEO BRONCHOSCOPY WITH FLUORO;  Surgeon: Candee Furbish, MD;  Location: [facility];  Service: Endoscopy;  Laterality: N/A;  Social History   Socioeconomic History   Marital status: Divorced    Spouse name: Not on file   Number of children: Not on file   Years of education: Not on file   Highest education level: Not on file  Occupational History   Occupation: Dealer   Occupation: Curator  Tobacco Use   Smoking status: Former    Packs/day: 1.50    Years: 40.00    Total pack years: 60.00    Types: Cigarettes    Quit date: 2014    Years since quitting: 9.5   Smokeless tobacco: Former    Types: Nurse, children's Use: Never used   Substance and Sexual Activity   Alcohol use: Not Currently    Alcohol/week: 28.0 standard drinks of alcohol    Types: 28 Cans of beer per week    Comment: 4 beers a night previously   Drug use: Not Currently    Types: Marijuana    Comment: daily; quit using crack and methamphetamine about 5 months ago    Sexual activity: Not Currently    Partners: Female  Other Topics Concern   Not on file  Social History Narrative   Lives alone near [place] Determinants of Health   Financial Resource Strain: [place]  (12/25/2020)   Overall Financial Resource Strain (CARDIA)    Difficulty of Paying Living Expenses: Not very hard  Recent Concern: Financial Resource Strain - High Risk (10/29/2020)   Overall Financial Resource Strain (CARDIA)    Difficulty of Paying Living Expenses: Hard  Food Insecurity: Food Insecurity Present (03/16/2021)   Hunger Vital Sign    Worried About Running Out of Food in the Last Year: Sometimes true    Ran Out of Food in the Last Year: Sometimes true  Transportation Needs: Unmet Transportation Needs (07/08/2021)   PRAPARE - Transportation    Lack of Transportation (Medical): Yes    Lack of Transportation (Non-Medical): Yes  Physical Activity: Inactive (12/11/2020)   Exercise Vital Sign    Days of Exercise per Week: 0 days    Minutes of Exercise per Session: 0 min  Stress: No Stress Concern Present (12/25/2020)   [place] of Stress : Not at all  Social Connections: Moderately Integrated (11/13/2020)   Social Connection and Isolation Panel [NHANES]    Frequency of Communication with Friends and Family: More than three times a week    Frequency of Social Gatherings with Friends and Family: Once a week    Attends Religious Services: 1 to 4 times per year    Active Member of [employer] or Organizations: Yes    Attends Archivist Meetings: 1 to 4 times per year    Marital  Status: Divorced  Human resources officer Violence: Not on file     No Known Allergies   Outpatient Medications Prior to Visit  Medication Sig Dispense Refill   albuterol (PROVENTIL) (2.5 MG/3ML) 0.083% nebulizer solution Take 3 mLs (2.5 mg total) by nebulization every 6 (six) hours as needed for wheezing or shortness of breath. 270 mL 1   albuterol (VENTOLIN HFA) 108 (90 Base) MCG/ACT inhaler Inhale 2 puffs into the lungs every 4 (four) hours as needed. 18 g 0   clobetasol cream (TEMOVATE) 8.09 % Apply 1 Application topically 2 (two) times daily. 30 g 0   clotrimazole-betamethasone (LOTRISONE) cream Apply 1 Application topically 2 (two) times daily. To groin  affected areas 30 g 0   fluconazole (DIFLUCAN) 150 MG tablet Take 1 tablet (150 mg total) by mouth daily. 3 tablet 0   Fluticasone-Umeclidin-Vilant (TRELEGY ELLIPTA) 100-62.5-25 MCG/ACT AEPB Inhale 1 puff into the lungs daily. 60 each 2   furosemide (LASIX) 20 MG tablet Take 1 tablet (20 mg total) by mouth daily as needed for edema. 30 tablet 2   glipiZIDE (GLUCOTROL XL) 5 MG 24 hr tablet Take 1 tablet (5 mg total) by mouth daily with breakfast. 60 tablet 1   guaiFENesin (ROBITUSSIN) 100 MG/5ML liquid Take 5 mLs by mouth every 4 (four) hours as needed for cough or to loosen phlegm. 120 mL 0   losartan (COZAAR) 50 MG tablet Take 1 tablet (50 mg total) by mouth daily. 90 tablet 1   metoprolol succinate (TOPROL-XL) 25 MG 24 hr tablet Take 1 tablet (25 mg total) by mouth daily. 90 tablet 3   pantoprazole (PROTONIX) 40 MG tablet Take 1 tablet (40 mg total) by mouth daily. 60 tablet 6   predniSONE (DELTASONE) 10 MG tablet Take 1 tablet (10 mg total) by mouth daily with breakfast. 60 tablet 3   pregabalin (LYRICA) 100 MG capsule Take 1 capsule (100 mg total) by mouth 2 (two) times daily. 60 capsule 2   Vitamin D3 (VITAMIN D) 25 MCG tablet Take 1 tablet by mouth daily. 60 tablet 2   No facility-administered medications prior to visit.   Review of  Systems  Constitutional:  Negative for chills, fever, malaise/fatigue and weight loss.  HENT:  Negative for hearing loss, sore throat and tinnitus.   Eyes:  Negative for blurred vision and double vision.  Respiratory:  Negative for cough, hemoptysis, sputum production, shortness of breath, wheezing and stridor.   Cardiovascular:  Negative for chest pain, palpitations, orthopnea, leg swelling and PND.  Gastrointestinal:  Negative for abdominal pain, constipation, diarrhea, heartburn, nausea and vomiting.  Genitourinary:  Negative for dysuria, hematuria and urgency.  Musculoskeletal:  Negative for joint pain and myalgias.  Skin:  Negative for itching and rash.  Neurological:  Negative for dizziness, tingling, weakness and headaches.  Endo/Heme/Allergies:  Negative for environmental allergies. Does not bruise/bleed easily.  Psychiatric/Behavioral:  Negative for depression. The patient is not nervous/anxious and does not have insomnia.   All other systems reviewed and are negative.    Objective:   Physical Exam Vitals reviewed.  Constitutional:      General: He is not in acute distress.    Appearance: He is well-developed. He is obese.  HENT:     Head: Normocephalic and atraumatic.  Eyes:     General: No scleral icterus.    Conjunctiva/sclera: Conjunctivae normal.     Pupils: Pupils are equal, round, and reactive to light.  Neck:     Vascular: No JVD.     Trachea: No tracheal deviation.  Cardiovascular:     Rate and Rhythm: Normal rate and regular rhythm.     Heart sounds: Normal heart sounds. No murmur heard. Pulmonary:     Effort: Pulmonary effort is normal. No tachypnea, accessory muscle usage or respiratory distress.     Breath sounds: No stridor. No wheezing, rhonchi or rales.  Abdominal:     General: There is no distension.     Palpations: Abdomen is soft.     Tenderness: There is no abdominal tenderness.  Musculoskeletal:        General: No tenderness.     Cervical  back: Neck supple.  Lymphadenopathy:  Cervical: No cervical adenopathy.  Skin:    General: Skin is warm and dry.     Capillary Refill: Capillary refill takes less than 2 seconds.     Findings: No rash.  Neurological:     Mental Status: He is alert and oriented to person, place, and time.  Psychiatric:        Behavior: Behavior normal.     BP 130/70 (BP Location: Right Arm)   Pulse 77   Ht 5\' 9"  (1.753 m)   Wt 206 lb (93.4 kg)   SpO2 98% Comment: On 3L of oxygen  BMI 30.42 kg/m    94% on  RA BMI Readings from Last 3 Encounters:  07/28/21 31.22 kg/m  07/21/21 31.19 kg/m  07/14/21 30.49 kg/m   Wt Readings from Last 3 Encounters:  07/28/21 211 lb 6.4 oz (95.9 kg)  07/21/21 211 lb 3.2 oz (95.8 kg)  07/14/21 206 lb 8 oz (93.7 kg)    Chest Imaging: CT Chest 06/2021: Several new pulmonary nodules with largest at 6 mm on R.  Pulmonary Functions Testing Results: Pulmonary Functions Testing Results:  TLC  Date Value Ref Range Status  01/27/2022 6.64 L Preliminary        Assessment & Plan:      ICD-10-CM   1. Lung nodules  R91.8     2. Centrilobular emphysema ([place])  J43.2     3. Stage 3 severe COPD by GOLD classification ([place])  J44.9       Discussion:  This is a 62 year old male, seen today for pulmonary nodules.  He was seen several months ago with newfound pulmonary nodules and was planned to have follow-up CT imaging in December 2023.  Does not.  This has been scheduled.  He did have his pulmonary function test complete prior to today's office visit.  He is also currently maintained with COPD management on Trelegy.  Plan: Continue Trelegy for COPD management Reviewed pulmonary function test today in the office which does reveal stage III COPD with an FEV1 of 40s. We will get his CT scan complete. We will need to set up a virtual visit to discuss results with patient once they have their lung nodule CT follow-up complete.   Garner Nash,  DO [facility] 01/27/2022 3:26 PM

## 2022-01-27 NOTE — Patient Instructions (Addendum)
Thank you for visiting Dr. Valeta Harms at [facility]. Today we recommend the following:  Return if symptoms worsen or fail to improve, for we will setup follow up after your ct has been complete. .    Please do your part to reduce the spread of COVID-19.

## 2022-02-01 ENCOUNTER — Telehealth (HOSPITAL_COMMUNITY): Payer: Self-pay

## 2022-02-02 NOTE — Telephone Encounter (Signed)
Attempted to contact pt to sch home visit this week.  No answer. I called both numbers listed in chart-one of them was his sister-she did answer and is suppose to call him later and get him to call me back.  [name], [place] 02/01/2022

## 2022-02-04 ENCOUNTER — Other Ambulatory Visit: Payer: Self-pay | Admitting: Critical Care Medicine

## 2022-02-04 ENCOUNTER — Other Ambulatory Visit: Payer: Self-pay

## 2022-02-04 DIAGNOSIS — J9601 Acute respiratory failure with hypoxia: Secondary | ICD-10-CM | POA: Diagnosis not present

## 2022-02-04 DIAGNOSIS — J9621 Acute and chronic respiratory failure with hypoxia: Secondary | ICD-10-CM | POA: Diagnosis not present

## 2022-02-04 DIAGNOSIS — F79 Unspecified intellectual disabilities: Secondary | ICD-10-CM | POA: Diagnosis not present

## 2022-02-05 ENCOUNTER — Telehealth: Payer: Self-pay | Admitting: Emergency Medicine

## 2022-02-05 NOTE — Telephone Encounter (Signed)
Requested medications are due for refill today.  unsure  Requested medications are on the active medications list.  no  Last refill. 12/29/2021 #60 6 rf  Future visit scheduled.   yes  Notes to clinic.  Medication was discontinued 12/31/2021. Refill not delegated. Not on med list.    Requested Prescriptions  Pending Prescriptions Disp Refills   predniSONE (DELTASONE) 10 MG tablet 60 tablet 6    Sig: Take 1 tablet (10 mg total) by mouth daily.     Not Delegated - Endocrinology:  Oral Corticosteroids Failed - 02/04/2022  5:26 PM      Failed - This refill cannot be delegated      Failed - Manual Review: Eye exam for IOP if prolonged treatment      Failed - Glucose (serum) in normal range and within 180 days    Glucose, Bld  Date Value Ref Range Status  12/31/2021 116 (H) 70 - 99 mg/dL Final    Comment:    Glucose reference range applies only to samples taken after fasting for at least 8 hours.   POC Glucose  Date Value Ref Range Status  06/29/2021 227 (A) 70 - 99 mg/dl Final   Glucose-Capillary  Date Value Ref Range Status  12/08/2021 122 (H) 70 - 99 mg/dL Final    Comment:    Glucose reference range applies only to samples taken after fasting for at least 8 hours.         Failed - K in normal range and within 180 days    Potassium  Date Value Ref Range Status  12/31/2021 3.0 (L) 3.5 - 5.1 mmol/L Final         Failed - Bone Mineral Density or Dexa Scan completed in the last 2 years      Passed - Na in normal range and within 180 days    Sodium  Date Value Ref Range Status  12/31/2021 143 135 - 145 mmol/L Final  12/29/2021 142 134 - 144 mmol/L Final         Passed - Last BP in normal range    BP Readings from Last 1 Encounters:  01/27/22 130/70         Passed - Valid encounter within last 6 months    Recent Outpatient Visits           1 month ago Community acquired pneumonia of right lower lobe of lung   [place], [place]   6 months ago Community acquired pneumonia, unspecified laterality   [place] [name], MD   7 months ago COPD with chronic bronchitis [facility])   [place] [name], MD   8 months ago Recurrent pneumonia   [place] [name], MD   9 months ago COPD with chronic bronchitis [facility])   [place] [name], MD       Future Appointments             In 3 weeks Elie Confer, Lars Masson, NP [facility] [place]   In 3 weeks [name], MD [place]   In 3 weeks [place], [place], [place]   In 3 months O'Neal, Cassie Freer, MD [place] at [facility]

## 2022-02-05 NOTE — Telephone Encounter (Signed)
Copied from [place] [phone]. Topic: General - Other >> Feb 05, 2022 11:48 AM Everette C wrote: Reason for CRM: The patient's sister has called to follow up with a member of clinical staff regarding the patient's refill request of  predniSONE (DELTASONE) 10 MG tablet  Please contact further when possible

## 2022-02-06 DIAGNOSIS — J9621 Acute and chronic respiratory failure with hypoxia: Secondary | ICD-10-CM | POA: Diagnosis not present

## 2022-02-06 DIAGNOSIS — J9601 Acute respiratory failure with hypoxia: Secondary | ICD-10-CM | POA: Diagnosis not present

## 2022-02-06 DIAGNOSIS — F79 Unspecified intellectual disabilities: Secondary | ICD-10-CM | POA: Diagnosis not present

## 2022-02-06 MED ORDER — PREDNISONE 10 MG PO TABS
10.0000 mg | ORAL_TABLET | Freq: Every day | ORAL | 6 refills | Status: DC
  Filled 2022-02-06: qty 30, 30d supply, fill #0
  Filled 2022-03-15: qty 30, 30d supply, fill #1

## 2022-02-08 ENCOUNTER — Other Ambulatory Visit: Payer: Self-pay

## 2022-02-08 ENCOUNTER — Ambulatory Visit: Payer: Medicaid Other | Admitting: Internal Medicine

## 2022-02-08 NOTE — Telephone Encounter (Signed)
Called patient and he has refills, called the pharmacy to verify and patient is aware of this

## 2022-02-09 ENCOUNTER — Other Ambulatory Visit: Payer: Self-pay

## 2022-02-10 ENCOUNTER — Other Ambulatory Visit: Payer: Self-pay

## 2022-02-10 ENCOUNTER — Other Ambulatory Visit (HOSPITAL_COMMUNITY): Payer: Self-pay

## 2022-02-10 NOTE — Progress Notes (Signed)
Paramedicine Encounter    Patient ID: James [name], male    DOB: 1960/10/21, 62 y.o.   MRN: [id number]   Complaints-leg pain-due to being out of lyrica   Edema-none   Compliance with meds-yes  Pill box filled-does not use  If so, by whom  Refills needed-lasix-he has to go get that  Also has to get vit d and lyrica    Pt reports he has been feeling good.  He finally got his prednisone filled and now he feels better, prior to that he was feeling bad. He said pharmacy was denying it for some reason but they did fill it.  He denies c/p, no dizziness, no increased sob.  He does report leg pain, he has been out of lyrica for 1 week-he said they brought it out here but he had to sign for it but he wasn't here so he has to go p/u which he plans to do.  We talked about his symptoms leading up to the point of him getting sob and having to call 911 and be admitted.  He said his cough would be worse, it would be productive with phlegm-so I advised him to contact me or dr Ileana Roup office to let them know so we can get ahead of it and hope to keep him out of the hospital and potentially treat this at home.  He agreed and seemed to understand. He has not other issues or concerns at this time.  He seems to handle his meds on his own.  Lungs clear, no sob at this time. No edema noted.    BP 112/60   Pulse 78   Resp 20   SpO2 98%    Patient Care Team: [name], MD as PCP - General (Pulmonary Disease) O'Neal, Cassie Freer, MD as PCP - Cardiology (Cardiology) [name], RN as Case Manager Greg Cutter, LCSW as [place] [facility] (Licensed Clinical Social Worker)  Patient Active Problem List   Diagnosis Date Noted   Vision changes 12/29/2021   Oropharyngeal dysphagia 12/29/2021   Onychomycosis 12/29/2021   Malnutrition of moderate degree 12/05/2021   Respiratory failure ([place]) 11/28/2021   Tinea cruris 07/21/2021   Community acquired pneumonia  07/12/2021   Multiple lung nodules on CT 06/11/2021   COPD with chronic bronchitis 02/01/2021   GERD (gastroesophageal reflux disease) 02/01/2021   Neuropathy 09/29/2020   Bronchiectasis ([place]) 09/24/2019   Type 2 diabetes mellitus without complication, without long-term current use of insulin ([place]) 07/24/2019   Other atopic dermatitis 05/01/2019   Chronic respiratory failure with hypoxia ([place]) 04/23/2019   Intellectual disability 04/23/2019   Alcohol use disorder 11/23/2018   COPD exacerbation ([place]) 02/16/2018   Hypertension 05/17/2016   Former tobacco use 04/06/2016   COPD mixed type ([place]) 03/26/2016    Current Outpatient Medications:    albuterol (PROVENTIL) (2.5 MG/3ML) 0.083% nebulizer solution, Take 3 mLs (2.5 mg total) by nebulization every 4 (four) hours as needed for wheezing or shortness of breath., Disp: 270 mL, Rfl: 1   albuterol (VENTOLIN HFA) 108 (90 Base) MCG/ACT inhaler, Inhale 2 puffs into the lungs every 4 (four) hours as needed., Disp: 18 g, Rfl: 1   Budeson-Glycopyrrol-Formoterol (BREZTRI AEROSPHERE) 160-9-4.8 MCG/ACT AERO, Inhale 2 puffs into the lungs in the morning and at bedtime., Disp: 10.7 g, Rfl: 11   furosemide (LASIX) 40 MG tablet, Take 1 tablet (40 mg total) by mouth daily., Disp: 30 tablet, Rfl: 1   glipiZIDE (GLUCOTROL XL)  5 MG 24 hr tablet, Take 1 tablet (5 mg total) by mouth daily with breakfast., Disp: 60 tablet, Rfl: 1   losartan (COZAAR) 100 MG tablet, Take 1 tablet (100 mg total) by mouth daily., Disp: 90 tablet, Rfl: 2   metoprolol succinate (TOPROL-XL) 50 MG 24 hr tablet, Take 1 tablet (50 mg total) by mouth daily. Take with or immediately following a meal., Disp: 90 tablet, Rfl: 2   nystatin cream (MYCOSTATIN), Apply to affected areas 3 (three) times daily., Disp: 30 g, Rfl: 2   pantoprazole (PROTONIX) 40 MG tablet, Take 1 tablet (40 mg total) by mouth daily., Disp: 60 tablet, Rfl: 6   predniSONE (DELTASONE) 10 MG tablet, Take 1 tablet (10 mg total)  by mouth daily., Disp: 60 tablet, Rfl: 6   pregabalin (LYRICA) 100 MG capsule, Take 1 capsule (100 mg total) by mouth 2 (two) times daily., Disp: 60 capsule, Rfl: 2   Vitamin D3 (VITAMIN D) 25 MCG tablet, Take 1 tablet by mouth daily., Disp: 60 tablet, Rfl: 2   azithromycin (ZITHROMAX) 250 MG tablet, Take 1 tablet (250 mg total) by mouth daily. Take first 2 tablets together, then 1 every day until finished. (Patient not taking: Reported on 01/18/2022), Disp: 6 tablet, Rfl: 0 No Known Allergies    Social History   Socioeconomic History   Marital status: Divorced    Spouse name: Not on file   Number of children: Not on file   Years of education: Not on file   Highest education level: Not on file  Occupational History   Occupation: Dealer   Occupation: Curator  Tobacco Use   Smoking status: Former    Packs/day: 1.50    Years: 40.00    Total pack years: 60.00    Types: Cigarettes    Quit date: 2014    Years since quitting: 10.1   Smokeless tobacco: Former    Types: Nurse, children's Use: Never used  Substance and Sexual Activity   Alcohol use: Not Currently    Alcohol/week: 28.0 standard drinks of alcohol    Types: 28 Cans of beer per week    Comment: 4 beers a night previously   Drug use: Not Currently    Types: Marijuana    Comment: daily; quit using crack and methamphetamine about 5 months ago    Sexual activity: Not Currently    Partners: Female  Other Topics Concern   Not on file  Social History Narrative   Lives alone near [place] Determinants of Health   Financial Resource Strain: [place]  (12/25/2020)   Overall Financial Resource Strain (CARDIA)    Difficulty of Paying Living Expenses: Not very hard  Recent Concern: Financial Resource Strain - High Risk (10/29/2020)   Overall Financial Resource Strain (CARDIA)    Difficulty of Paying Living Expenses: Hard  Food Insecurity: No Food Insecurity (01/18/2022)   Hunger Vital Sign    Worried  About Running Out of Food in the Last Year: Never true    Ran Out of Food in the Last Year: Never true  Transportation Needs: Unmet Transportation Needs (01/05/2022)   PRAPARE - Transportation    Lack of Transportation (Medical): Yes    Lack of Transportation (Non-Medical): Yes  Physical Activity: Inactive (12/11/2020)   Exercise Vital Sign    Days of Exercise per Week: 0 days    Minutes of Exercise per Session: 0 min  Stress: No Stress Concern Present (12/25/2020)  [employer] - Occupational Stress Questionnaire    Feeling of Stress : Not at all  Social Connections: Moderately Integrated (11/13/2020)   Social Connection and Isolation Panel [NHANES]    Frequency of Communication with Friends and Family: More than three times a week    Frequency of Social Gatherings with Friends and Family: Once a week    Attends Religious Services: 1 to 4 times per year    Active Member of [employer] or Organizations: Yes    Attends Archivist Meetings: 1 to 4 times per year    Marital Status: Divorced  Intimate Partner Violence: Not At Risk (12/03/2021)   Humiliation, Afraid, Rape, and Kick questionnaire    Fear of Current or Ex-Partner: No    Emotionally Abused: No    Physically Abused: No    Sexually Abused: No    Physical Exam      Future Appointments  Date Time Provider [place]  02/17/2022  9:00 AM [name], RN [facility] None  02/22/2022  3:00 PM GI-315 CT 2 GI-315CT [facility]  02/26/2022 11:30 AM Magdalen Spatz, NP [facility] None  03/02/2022  9:30 AM [name], MD [facility] None  03/04/2022  8:50 AM Argentina Donovan, PA-C [facility] None  04/13/2022 11:15 AM Lorenda Peck, DPM [facility]  05/12/2022  8:00 AM O'Neal, Cassie Freer, MD [facility] None       [name], [place] Paramedic  02/10/22

## 2022-02-17 ENCOUNTER — Other Ambulatory Visit: Payer: Medicaid Other | Admitting: *Deleted

## 2022-02-17 ENCOUNTER — Encounter: Payer: Self-pay | Admitting: *Deleted

## 2022-02-17 NOTE — Patient Outreach (Signed)
Medicaid Managed Care   Nurse Care Manager Note  02/17/2022 Name:  James [name] MRN:  [id number] DOB:  1960-03-18  James [name] is an 62 y.o. year old male who is a primary patient of James Gaskins Burnett Harry, MD.  The [facility] Managed Care Coordination team was consulted for assistance with:    COPD  James [name] was given information about Medicaid Managed Care Coordination team services today. [name] Patient agreed to services and verbal consent obtained.  Engaged with patient by telephone for follow up visit in response to provider referral for case management and/or care coordination services.   Assessments/Interventions:  Review of past medical history, allergies, medications, health status, including review of consultants reports, laboratory and other test data, was performed as part of comprehensive evaluation and provision of chronic care management services.  SDOH (Social Determinants of Health) assessments and interventions performed: SDOH Interventions    Flowsheet Row Patient Outreach Telephone from 02/17/2022 in [place] Patient Outreach Telephone from 01/18/2022 in [place] Telephone from 01/05/2022 in [place] Patient Outreach Telephone from 12/17/2021 in [place] ED to Hosp-Admission (Discharged) from 11/28/2021 in [place] Patient Outreach Telephone from 09/09/2021 in [place] Interventions        Food Insecurity Interventions Intervention Not Indicated Intervention Not Indicated -- Intervention Not Indicated -- --  Housing Interventions Intervention Not Indicated -- -- -- Intervention Not Indicated Intervention Not Indicated  Transportation Interventions Payor Benefit  [Assisted with arranging transportation] -- Other (Comment)  [assisted with arranging  transportation to appointments on 01/12/22 and 01/27/22] Other (Comment)  [RNCM assisted with arranging transportation to upcoming appointments] -- --  Utilities Interventions Other (Comment)  [Collaborated with BSW for utility assistance] Intervention Not Indicated -- -- -- --  Alcohol Usage Interventions -- -- -- -- -- Intervention Not Indicated (Score <7)       Care Plan  No Known Allergies  Medications Reviewed Today     Reviewed by James Montane, RN (Registered Nurse) on 02/17/22 at [phone]  Med List Status: <None>   Medication Order Taking? Sig Documenting Provider Last Dose Status Informant  albuterol (PROVENTIL) (2.5 MG/3ML) 0.083% nebulizer solution [id number] Yes Take 3 mLs (2.5 mg total) by nebulization every 4 (four) hours as needed for wheezing or shortness of breath. James Stain, MD Taking Active   albuterol (VENTOLIN HFA) 108 [phone] Base) MCG/ACT inhaler [id number] Yes Inhale 2 puffs into the lungs every 4 (four) hours as needed. James Stain, MD Taking Active   azithromycin [facility]) 250 MG tablet [id number] No Take 1 tablet (250 mg total) by mouth daily. Take first 2 tablets together, then 1 every day until finished.  Patient not taking: Reported on 01/18/2022   James Sites, DO Not Taking Active            Med Note James [name], Londyn Hotard A   Mon Jan 18, 2022 10:36 AM) completed  Budeson-Glycopyrrol-Formoterol (BREZTRI AEROSPHERE) 160-9-4.8 MCG/ACT AERO [id number] Yes Inhale 2 puffs into the lungs in the morning and at bedtime. James Hurter, MD Taking Active Pharmacy Records           Med Note ([place]field Gildersleeve A   Wed Nov 18, 2021  3:30 PM)    furosemide (LASIX) 40 MG tablet [id number] Yes Take 1 tablet (40 mg total) by mouth daily. James Stain, MD  Taking Active   glipiZIDE (GLUCOTROL XL) 5 MG 24 hr tablet [id number] Yes Take 1 tablet (5 mg total) by mouth daily with breakfast. James Stain, MD Taking Active   losartan (COZAAR) 100 MG tablet [id number] Yes Take 1  tablet (100 mg total) by mouth daily. James Stain, MD Taking Active   metoprolol succinate (TOPROL-XL) 50 MG 24 hr tablet [id number] Yes Take 1 tablet (50 mg total) by mouth daily. Take with or immediately following a meal. James Stain, MD Taking Active   nystatin cream (MYCOSTATIN) [id number] Yes Apply to affected areas 3 (three) times daily. James Stain, MD Taking Active   pantoprazole (PROTONIX) 40 MG tablet [id number] Yes Take 1 tablet (40 mg total) by mouth daily. James Stain, MD Taking Active   predniSONE (DELTASONE) 10 MG tablet [id number] Yes Take 1 tablet (10 mg total) by mouth daily. James Stain, MD Taking Active   pregabalin (LYRICA) 100 MG capsule [id number] Yes Take 1 capsule (100 mg total) by mouth 2 (two) times daily. James Stain, MD Taking Active   Vitamin D3 (VITAMIN D) 25 MCG tablet [id number] No Take 1 tablet by mouth daily.  Patient not taking: Reported on 02/17/2022   [name], MD Not Taking Active Pharmacy Records           Med Note Harvel Ricks   Tue Oct 13, 2021 10:31 AM)              Patient Active Problem List   Diagnosis Date Noted   Vision changes 12/29/2021   Oropharyngeal dysphagia 12/29/2021   Onychomycosis 12/29/2021   Malnutrition of moderate degree 12/05/2021   Respiratory failure ([place]) 11/28/2021   Tinea cruris 07/21/2021   Community acquired pneumonia 07/12/2021   Multiple lung nodules on CT 06/11/2021   COPD with chronic bronchitis 02/01/2021   GERD (gastroesophageal reflux disease) 02/01/2021   Neuropathy 09/29/2020   Bronchiectasis ([place]) 09/24/2019   Type 2 diabetes mellitus without complication, without long-term current use of insulin ([place]) 07/24/2019   Other atopic dermatitis 05/01/2019   Chronic respiratory failure with hypoxia ([place]) 04/23/2019   Intellectual disability 04/23/2019   Alcohol use disorder 11/23/2018   COPD exacerbation ([place]) 02/16/2018   Hypertension 05/17/2016   Former  tobacco use 04/06/2016   COPD mixed type ([place]) 03/26/2016    Conditions to be addressed/monitored per PCP order:  COPD  Care Plan : [place] of Care  Updates made by James Montane, RN since 02/17/2022 12:00 AM     Problem: Chronic Disease Management needs in patient with COPD   Priority: High     Long-Range Goal: Plan of care established for for Chronic Disease management for patient with COPD   Start Date: 11/13/2020  Expected End Date: 04/02/2022  Priority: High  Note:   Current Barriers:  Care Coordination needs related to Limited access to food  Chronic Disease Management support and education needs related to COPD Mr. Budai reports feeling the same. He is scheduled for a Chest CT on 02/22/22 and needs transportation. Recent utility bill was $275, which is a good portion of his monthly income.  RNCM Clinical Goal(s):  Patient will verbalize understanding of plan for management of COPD as evidenced by taking all medications as prescribed, attending all scheduled appointments and contacting provider with questions and concerns take all medications exactly as prescribed and will call provider for medication related questions as evidenced by refilling medications and taking as prescribed  attend all scheduled medical appointments: 01/27/22 with Pulmonology as evidenced by provider notes in EMR        demonstrate improved adherence to prescribed treatment plan for COPD as evidenced by taking all medications as prescribed, attending all scheduled appointments and contacting provider with questions and concerns continue to work with RN Care Manager and/or Social Worker to address care management and care coordination needs related to COPD as evidenced by adherence to CM Team Scheduled appointments     through collaboration with RN Care manager, provider, and care team.   Interventions: Inter-disciplinary care team collaboration (see longitudinal plan of care) Evaluation of  current treatment plan related to  self management and patient's adherence to plan as established by provider Collaborate with BSW regarding assistance with utilities Assisted with rescheduling missed GI visit-new appointment on 04/01/22   SDOH Barriers (Status:  Goal on track:  Yes. Patient interviewed and SDOH assessment performed        SDOH Interventions    Flowsheet Row Patient Outreach Telephone from 02/17/2022 in [place] Patient Outreach Telephone from 01/18/2022 in [place] Telephone from 01/05/2022 in [place] Patient Outreach Telephone from 12/17/2021 in [place] ED to Hosp-Admission (Discharged) from 11/28/2021 in [place] Patient Outreach Telephone from 09/09/2021 in [place] Interventions        Food Insecurity Interventions Intervention Not Indicated Intervention Not Indicated -- Intervention Not Indicated -- --  Housing Interventions Intervention Not Indicated -- -- -- Intervention Not Indicated Intervention Not Indicated  Transportation Interventions Payor Benefit  [Assisted with arranging transportation] -- Other (Comment)  [assisted with arranging transportation to appointments on 01/12/22 and 01/27/22] Other (Comment)  [RNCM assisted with arranging transportation to upcoming appointments] -- --  Utilities Interventions Other (Comment)  [Collaborated with BSW for utility assistance] Intervention Not Indicated -- -- -- --  Alcohol Usage Interventions -- -- -- -- -- Intervention Not Indicated (Score <7)       Patient interviewed and appropriate assessments performed Discussed plans with patient for ongoing care management follow up and provided patient with direct contact information for care management team- Assisted with arranging transportation to  [place]  on 02/22/22, pick up time 2:15pm, instructed patient to have only liquids after 11am; Assisted with arranging transportation to Dr. Bettina Gavia office on 03/02/22, pick up time 8:30am-Patient aware of transportation details Collaborated with Pulmonology to change MyChart visit on 02/26/22 to a telephone visit(patient unable to do MyChart visit)-patient aware of phone call on 02/26/22 at 11:30am   COPD: (Status: Goal on Track (progressing): YES.) Long Term Goal  Reviewed medications with patient, including use of prescribed maintenance and rescue inhalers, and provided instruction on medication management and the importance of adherence Advised patient to self assesses COPD action plan zone and make appointment with provider if in the yellow zone for 48 hours without improvement Provided education about and advised patient to utilize infection prevention strategies to reduce risk of respiratory infection Discussed the importance of adequate rest and management of fatigue with COPD Advised patient to avoid going out in the cold, cover mouth with a scarf when going out Infection prevention reviewed Encouraged patient to continue to avoid drinking alcohol and avoid smoke Discussed the importance of a healthy diet, including increasing fruits and vegetables   Patient Goals/Self-Care Activities: Take medications as prescribed   Attend all scheduled provider appointments  Call pharmacy for medication refills 3-7 days in advance of running out of medications Perform all self care activities independently  Call provider office for new concerns or questions  - avoid second hand smoke - limit outdoor activity during cold weather - keep follow-up appointments:  01/27/22 with Pulmonology - eat healthy/prescribed diet: DASH - get at least 7 to 8 hours of sleep at night       Follow Up:  Patient agrees to Care Plan and Follow-up.  Plan: The Managed Medicaid care management team will reach out to the patient  again over the next 30 days.  Date/time of next scheduled RN care management/care coordination outreach:  03/19/22 @ [place] RN, BSN [place]  [employer] manager

## 2022-02-17 NOTE — Patient Instructions (Signed)
Visit Information  [name] was given information about Medicaid Managed Care team care coordination services as a part of their Healthy [facility] Medicaid benefit. James [name] verbally consented to engagement with the [facility] Managed Care team.   If you are experiencing a medical emergency, please call 911 or report to your local emergency department or urgent care.   If you have a non-emergency medical problem during routine business hours, please contact your provider's office and ask to speak with a nurse.   For questions related to your Healthy [facility] health plan, please call: [phone] or visit the homepage here: [email or web address]  If you would like to schedule transportation through your Healthy [facility] plan, please call the following number at least 2 days in advance of your appointment: [phone]  For information about your ride after you set it up, call Ride Assist at [phone]. Use this number to activate a [place] pickup, or if your transportation is late for a scheduled pickup. Use this number, too, if you need to make a change or cancel a previously scheduled reservation.  If you need transportation services right away, call [phone]. The after-hours call center is staffed 24 hours to handle ride assistance and urgent reservation requests (including discharges) 365 days a year. Urgent trips include sick visits, hospital discharge requests and life-sustaining treatment.  Call the [place] at [phone], at any time, 24 hours a day, 7 days a week. If you are in danger or need immediate medical attention call 911.  If you would like help to quit smoking, call 1-800-QUIT-NOW 281-244-7081) OR Espaol: 1-855-Djelo-Ya (9-357-017-7939) o para ms informacin haga clic aqu or Text READY to 200-400 to register via text  [name],   Please see education materials related to COPD provided by  MyChart link.  The patient verbalized understanding of instructions, educational materials, and care plan provided today and agreed to receive a mailed copy of patient instructions, educational materials, and care plan.   Telephone follow up appointment with Managed Medicaid care management team member scheduled for:03/19/22 @ [place] RN, [place] RN Care Coordinator   Following is a copy of your plan of care:  Care Plan : RN Care Manager Plan of Care  Updates made by [name], RN since 02/17/2022 12:00 AM     Problem: Chronic Disease Management needs in patient with COPD   Priority: High     Long-Range Goal: Plan of care established for for Chronic Disease management for patient with COPD   Start Date: 11/13/2020  Expected End Date: 04/02/2022  Priority: High  Note:   Current Barriers:  Care Coordination needs related to Limited access to food  Chronic Disease Management support and education needs related to COPD [name] reports feeling the same. He is scheduled for a Chest CT on 02/22/22 and needs transportation. Recent utility bill was $275, which is a good portion of his monthly income.  RNCM Clinical Goal(s):  Patient will verbalize understanding of plan for management of COPD as evidenced by taking all medications as prescribed, attending all scheduled appointments and contacting provider with questions and concerns take all medications exactly as prescribed and will call provider for medication related questions as evidenced by refilling medications and taking as prescribed    attend all scheduled medical appointments: 01/27/22 with Pulmonology as evidenced by provider notes in EMR        demonstrate improved adherence to prescribed treatment  plan for COPD as evidenced by taking all medications as prescribed, attending all scheduled appointments and contacting provider with questions and concerns continue to work with RN Care  Manager and/or Social Worker to address care management and care coordination needs related to COPD as evidenced by adherence to CM Team Scheduled appointments     through collaboration with RN Care manager, provider, and care team.   Interventions: Inter-disciplinary care team collaboration (see longitudinal plan of care) Evaluation of current treatment plan related to  self management and patient's adherence to plan as established by provider Collaborate with BSW regarding assistance with utilities Assisted with rescheduling missed GI visit-new appointment on 04/01/22   SDOH Barriers (Status:  Goal on track:  Yes. Patient interviewed and SDOH assessment performed        SDOH Interventions    Flowsheet Row Patient Outreach Telephone from 02/17/2022 in [place] Patient Outreach Telephone from 01/18/2022 in [place] Telephone from 01/05/2022 in [place] Patient Outreach Telephone from 12/17/2021 in [place] ED to Hosp-Admission (Discharged) from 11/28/2021 in [place] Patient Outreach Telephone from 09/09/2021 in [place] Interventions        Food Insecurity Interventions Intervention Not Indicated Intervention Not Indicated -- Intervention Not Indicated -- --  Housing Interventions Intervention Not Indicated -- -- -- Intervention Not Indicated Intervention Not Indicated  Transportation Interventions Payor Benefit  [Assisted with arranging transportation] -- Other (Comment)  [assisted with arranging transportation to appointments on 01/12/22 and 01/27/22] Other (Comment)  [RNCM assisted with arranging transportation to upcoming appointments] -- --  Utilities Interventions Other (Comment)  [Collaborated with BSW for utility assistance] Intervention Not Indicated -- -- -- --  Alcohol Usage  Interventions -- -- -- -- -- Intervention Not Indicated (Score <7)       Patient interviewed and appropriate assessments performed Discussed plans with patient for ongoing care management follow up and provided patient with direct contact information for care management team- Assisted with arranging transportation to  [place] on 02/22/22, pick up time 2:15pm, instructed patient to have only liquids after 11am; Assisted with arranging transportation to Dr. Bettina Gavia office on 03/02/22, pick up time 8:30am-Patient aware of transportation details Collaborated with Pulmonology to change MyChart visit on 02/26/22 to a telephone visit(patient unable to do MyChart visit)-patient aware of phone call on 02/26/22 at 11:30am   COPD: (Status: Goal on Track (progressing): YES.) Long Term Goal  Reviewed medications with patient, including use of prescribed maintenance and rescue inhalers, and provided instruction on medication management and the importance of adherence Advised patient to self assesses COPD action plan zone and make appointment with provider if in the yellow zone for 48 hours without improvement Provided education about and advised patient to utilize infection prevention strategies to reduce risk of respiratory infection Discussed the importance of adequate rest and management of fatigue with COPD Advised patient to avoid going out in the cold, cover mouth with a scarf when going out Infection prevention reviewed Encouraged patient to continue to avoid drinking alcohol and avoid smoke Discussed the importance of a healthy diet, including increasing fruits and vegetables   Patient Goals/Self-Care Activities: Take medications as prescribed   Attend all scheduled provider appointments Call pharmacy for medication refills 3-7 days in advance of running out of medications Perform all self care activities independently  Call provider office for new concerns or  questions  - avoid second  hand smoke - limit outdoor activity during cold weather - keep follow-up appointments:  01/27/22 with Pulmonology - eat healthy/prescribed diet: DASH - get at least 7 to 8 hours of sleep at night

## 2022-02-18 ENCOUNTER — Encounter: Payer: Self-pay | Admitting: Pulmonary Disease

## 2022-02-22 ENCOUNTER — Ambulatory Visit
Admission: RE | Admit: 2022-02-22 | Discharge: 2022-02-22 | Disposition: A | Discharge status: Home / Self Care | Payer: Medicaid Other | Source: Ambulatory Visit | Attending: Pulmonary Disease | Admitting: Pulmonary Disease

## 2022-02-22 DIAGNOSIS — R918 Other nonspecific abnormal finding of lung field: Secondary | ICD-10-CM

## 2022-02-23 ENCOUNTER — Other Ambulatory Visit: Payer: Medicaid Other | Admitting: *Deleted

## 2022-02-23 NOTE — Patient Outreach (Signed)
Care Coordination  02/23/2022  MELODY SAVIDGE [birth year] [phone]  RNCM received a voicemail from Mr. Dock today, requesting a return call. He was concerned about his upcoming appointment on 02/26/22 and requesting assistance arranging transportation. RNCM explained to Mr. Vivas that the visit on 02/26/22 would be a telephone call. Requested patient to be available from 11:15-11:45 am. [name] expressed understanding. RNCM also reviewed transportation details for upcoming visit on 03/02/22 with [name]. RNCM will follow up at next scheduled appointment time.  [name] RN, BSN [place]  [employer] manager

## 2022-02-25 ENCOUNTER — Telehealth: Payer: Self-pay | Admitting: *Deleted

## 2022-02-25 NOTE — Telephone Encounter (Signed)
PT calling back to confirm he doesn't have a smart phone or computer to do a video visit.

## 2022-02-25 NOTE — Telephone Encounter (Signed)
ATC patient x1 at [phone], no answer.  VM full.  ATC patient x1 at [phone], no answer. Left VM to return call regarding video visit.  We need to know if he has access to a smart phone or computer to do a video visit.  When he returns call please verify this with him.  Thanks.

## 2022-02-26 ENCOUNTER — Encounter: Payer: Self-pay | Admitting: Acute Care

## 2022-02-26 ENCOUNTER — Ambulatory Visit (INDEPENDENT_AMBULATORY_CARE_PROVIDER_SITE_OTHER): Payer: Medicaid Other | Admitting: Acute Care

## 2022-02-26 DIAGNOSIS — J449 Chronic obstructive pulmonary disease, unspecified: Secondary | ICD-10-CM

## 2022-02-26 DIAGNOSIS — Z87891 Personal history of nicotine dependence: Secondary | ICD-10-CM

## 2022-02-26 DIAGNOSIS — R918 Other nonspecific abnormal finding of lung field: Secondary | ICD-10-CM

## 2022-02-26 NOTE — Progress Notes (Signed)
Results discussed in OV with SG today   Thanks,  BLI  Garner Nash, DO [facility] 02/26/2022 2:20 PM

## 2022-02-26 NOTE — Patient Instructions (Addendum)
It was good to see you today. Your CT Chest showed that the nodules seen on the CT Chest in December have resolved.  These were most likely related to your pneumonia at the time.  We will enroll you in the lung cancer screening program, as you do qualify for this with your smoking history.  Your first scan will be due 02/2023.  Continue Breztri 2 puffs in the morning and 2 puffs in the evening  Rinse mouth after use Use rescue albuterol as needed for breakthrough shortness of breath or wheezing  Note your daily symptoms > remember "red flags" for COPD:  Increase in cough, increase in sputum production, increase in shortness of breath or activity intolerance. If you notice these symptoms, please call to be seen.    Follow up as needed  Please contact office for sooner follow up if symptoms do not improve or worsen or seek emergency care

## 2022-02-26 NOTE — Progress Notes (Signed)
Virtual Visit via Telephone Note  I connected with James [name] on 02/26/22 at 11:30 AM EST by telephone and verified that I am speaking with the correct person using two identifiers.  Location: Patient:  At home Provider: [address], [place], [place], [address]    I discussed the limitations, risks, security and privacy concerns of performing an evaluation and management service by telephone and the availability of in person appointments. I also discussed with the patient that there may be a patient responsible charge related to this service. The patient expressed understanding and agreed to proceed.  Synopsis James [name] is a 62 y.o. M with pertinent PMH of COPD on Trelegy and albuterol with several exacerbations over the last year, HTN, T2DM, atopic dermatitis who presented 01/27/2022  as a new patient referral for lung nodules noted on imaging 12/2021 during recent hospitalization for CAP.   smoking history of 2 PPD for 20 years starting at age 79. He quit smoking in 2014 after an accident at work. 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 His breathing at this time is the same as it has been for several years and he has grown accustomed to pursed-lip breathing. He has had several hospitalizations over the last year for pneumonia which he states did not provide him much relief overall. He states that he is doing well with Trelegy daily but using his PRN albuterol inhalor up to 4 times per day. Breathing is worse in hot weather but not positional.   He saw Dr. Valeta Harms 01/27/2022, and plan was for follow up CT Chest to re-evaluate the pulmonary nodules    History of Present Illness: Pt. Presents for tele visit to discuss follow up CT Chest results. I explained that his follow up CT showed resolution of the    Observations/Objective: CT Chest without Contrast 02/22/2022 Lungs/Pleura: Emphysematous scarring and bullous  changes apices. Right-sided lung nodules discussed on the prior study are no longer seen and presumably representing a postinfectious process or inflammation. No pneumonia or pulmonary edema. Minimal dependent subsegmental atelectasis or scarring. No pleural or pericardial effusion. Numerous nodules discussed on the prior study in the right lung are no longer seen consistent with interval resolution of an infectious or inflammatory process. Emphysematous and bullous changes with scarring. Minimal bilateral gynecomastia. No acute cardiopulmonary process.  PFT's 01/27/2022 >> Stage III COPD with FEV1 in the 40's, DLCO unc is 62%, DLCO cor is 72%  Assessment and Plan: Abnormal CT Chest imaging in a former smoker ( 60 pack year smoking history, quit 2014) Smoked 1.5 PPD x 40 years Follow up scanning shows resolution >> consistent with interval resolution of an infectious or inflammatory process. Plan Will enroll patient in lung cancer screening program First scan will be due 02/2023 Call to be seen sooner for blood in sputum or weight loss that you cannot explain  COPD Plan Continue Breztri 2 puffs in the morning and 2 puffs in the evening  Rinse mouth after use Use rescue albuterol as needed for breakthrough shortness of breath or wheezing  Note your daily symptoms > remember "red flags" for COPD:  Increase in cough, increase in sputum production, increase in shortness of breath or activity intolerance. If you notice these symptoms, please call to be seen.    Follow up as needed  Please contact office for sooner follow up if symptoms do not improve or worsen or seek emergency care    Note  your daily symptoms > remember "red flags" for COPD:  Increase in cough, increase in sputum production, increase in shortness of breath or activity intolerance. If you notice these symptoms, please call to be seen.     Follow Up Instructions: Lung Cancer Screening due 02/2023 Follow up as needed for  COPD Flare    I discussed the assessment and treatment plan with the patient. The patient was provided an opportunity to ask questions and all were answered. The patient agreed with the plan and demonstrated an understanding of the instructions.   The patient was advised to call back or seek an in-person evaluation if the symptoms worsen or if the condition fails to improve as anticipated.  I provided 22 minutes of non-face-to-face time during this encounter.   Magdalen Spatz, NP

## 2022-02-28 NOTE — Progress Notes (Signed)
Established Patient Office Visit  Subjective   Patient ID: James [name], male    DOB: 08-Jun-1960  Age: 62 y.o. MRN: [id number]  Chief Complaint  Patient presents with   Diabetes   COPD   Hypertension    06/29/21 Since the last visit patient's been doing well however he ran out of his losartan for 1 week.  He does complain of an itching rash on both arms.  He is painting the inside of his house he tries to stay from the out-of-doors and where the heat is.  CT of the chest was done and showed pulmonary nodules will need to be repeated in 6 months.  Pneumonia has resolved.  He has been compliant with his inhaled medications.  Breathing appears to be at baseline.  7/18 Yet another readmission since last OV: Patient had developed pneumonia increased cough shortness of breath here today for follow-up  Patient still coughing mucus white in nature some wheezing still.  Below is copy discharge summary  12/26 Patient seen as a posthospital visit and had yet another episode of pneumonia right lower lobe and required hospitalization. Below is a copy of the discharge summary and he has actually had 3 admissions now since July of this year Post Hosp visit   3 admits since 07/2021 OV Discharge date: 12/08/21 Discharge Physician: Rickey Barbara   PCP: [name], MD    Recommendations at discharge:    1.  Follow up with PCP in 1-2 weeks 2. Follow up with Cardiology as scheduled   Discharge Diagnoses: Principal Problem:   Respiratory failure (HCC) Active Problems:   COPD exacerbation (HCC)   Malnutrition of moderate degree   Non-STEMI (non-ST elevated myocardial infarction) (HCC)   Resolved Problems:   * No resolved hospital problems. *   Hospital Course: 62 year old gentleman from home, has history of COPD and chronic hypoxemic respiratory failure on 2 L oxygen at home.  He does have a history of type 2 diabetes, hypertension.  He was found with severe COPD exacerbation  secondary to rhinovirus infection, intubated and admitted to ICU.  Also found to have SVT , not Afib  1/25 Admit, intubated, started ABx, start cardizem gtt-->got hypotensive. Converted to SR but started on Neo.   11/26 Overnight transferred from [facility] ED to [facility] ICU  11/27 Remains intubated on Prop/Versed for sedation; increased secretions with high peak pressures on vent, Resp Cx sent.  11/28 Resp Cx with few gram + cocci in pairs. FiO2 needs down to 50%. Secretions improved, but ongoing breath stacking. Sedation to Precedex. Tolerating PSV. Extubated. Hypertensive, started on cleviprex + precedex  Cardiology following and diastolic CHF.  Ischemic evaluation with left heart cath planned for 12/4.   Assessment and Plan: Acute on chronic hypoxemic and hypercapnic respiratory failure: Acute COPD exacerbation due to rhinovirus infection as well as superimposed bacterial pneumonia: On baseline 2 L oxygen at home.  Extubated to 2 L of oxygen. Remained stable on transfer to floor Continued Brovana, Pulmicort and Yupelri.  On as needed bronchodilator therapy. Solu-Medrol, tapered off to prednisone Completed broad spec abx   Essential hypertension with hypertensive emergency: Initially hypotensive after intubation on vasopressors.  Then hypertensive.  Patient was on Cleviprex infusion. Currently blood pressure stabilized on blood pressure and Cozaar.  Given lasix   Recurrent SVT:  Echo with ejection fraction 50 to 55%.  Cardiology following.  Currently in sinus rhythm.  On metoprolol, aspirin.  Improved.   Type 2 diabetes poor control, steroid-induced  hyperglycemia: Fairly stable.  Not on treatment at home.   Elevated troponins with T wave depression: Respiratory status improving.  Cardiology had been following. Underwent clear heart cath 12/4. Cardiology recs for toprol xl 50mg . No need for ASA. Cardiology to f/u in 4 weeks   Clinically improving.   -Continue to work with PT OT.   -PT  recommended SNF but patient reportedly felt like he can go home.   -Eager to go home   Rash -pruritic rash in gluteal region, groin, and also over L forarm (areas pt is scratching) -Rash appears fungal -continue topical antifungal   Patient still has rash in the groin area and on buttocks.  He was given topical antifungal only.  He still coughing up yellow mucus still doing some wheezing.  He needs a follow-up chest x-ray.  He has completed his course of antibiotics is now down to the 10 mg daily prednisone.  Blood pressure is elevated on arrival 157/96.  He is now on metoprolol and low-dose losartan for blood pressure.  Still maintains the oral glipizide for diabetes.  He no longer has any home care.  He is back in the original housing has had before.  He does not drink alcohol is not smoking cigarettes.  He is chewing tobacco.  He is having trouble with swallowing and choking with this.  He needs a follow-up eye exam and needs his foot examined today.  Patient also notes vision changes left eye  03/02/22 Patient seen in return follow-up has not been back to the hospital except for a brief emergency room visit end of December.  Patient COPD is at baseline.  He just saw pulmonary medicine has an FEV1 of 40% predicted and a very low diffusion capacity due to centrilobular emphysema.  He is compliant with the [place] he.  He is using oxygen at bedtime as needed during the day.  On arrival saturation 94% room air.  Patient weight is up and is a BMI 31.6.  Blood sugar on arrival 164.  Patient takes the glipizide daily.  He is not on lipids therapy needs to be started.  Blood pressure on arrival is good 128/60.  Patient is not smoking or drinking alcohol.  He needs an A1c and other labs rechecked at this visit.  There are no other complaints.  Past medical history reviewed no changes made  Review of Systems  Constitutional:  Negative for chills, diaphoresis, fever, malaise/fatigue and weight loss.   HENT:  Negative for congestion, hearing loss, nosebleeds, sore throat and tinnitus.   Eyes:  Negative for blurred vision, photophobia and redness.  Respiratory:  Positive for shortness of breath. Negative for cough, hemoptysis, sputum production, wheezing and stridor.        White  Cardiovascular:  Negative for chest pain, palpitations, orthopnea, claudication, leg swelling and PND.  Gastrointestinal:  Negative for abdominal pain, blood in stool, constipation, diarrhea, heartburn, nausea and vomiting.  Genitourinary:  Negative for dysuria, flank pain, frequency, hematuria and urgency.  Musculoskeletal:  Negative for back pain, falls, joint pain, myalgias and neck pain.  Skin:  Negative for itching and rash.  Neurological:  Negative for dizziness, tingling, tremors, sensory change, speech change, focal weakness, seizures, loss of consciousness, weakness and headaches.  Endo/Heme/Allergies:  Negative for environmental allergies and polydipsia. Does not bruise/bleed easily.  Psychiatric/Behavioral:  Negative for depression, memory loss, substance abuse and suicidal ideas. The patient is not nervous/anxious and does not have insomnia.       Objective:  BP 128/60   Pulse 74   Ht 5\' 9"  (1.753 m)   Wt 214 lb (97.1 kg)   SpO2 94%   BMI 31.60 kg/m    Physical Exam Vitals reviewed.  Constitutional:      Appearance: Normal appearance. He is well-developed. He is obese. He is not diaphoretic.  HENT:     Head: Normocephalic and atraumatic.     Nose: No nasal deformity, septal deviation, mucosal edema or rhinorrhea.     Right Sinus: No maxillary sinus tenderness or frontal sinus tenderness.     Left Sinus: No maxillary sinus tenderness or frontal sinus tenderness.     Mouth/Throat:     Pharynx: No oropharyngeal exudate.  Eyes:     General: No scleral icterus.    Conjunctiva/sclera: Conjunctivae normal.     Pupils: Pupils are equal, round, and reactive to light.  Neck:     Thyroid:  No thyromegaly.     Vascular: No carotid bruit or JVD.     Trachea: Trachea normal. No tracheal tenderness or tracheal deviation.  Cardiovascular:     Rate and Rhythm: Normal rate and regular rhythm.     Chest Wall: PMI is not displaced.     Pulses: Normal pulses. No decreased pulses.     Heart sounds: Normal heart sounds, S1 normal and S2 normal. Heart sounds not distant. No murmur heard.    No systolic murmur is present.     No diastolic murmur is present.     No friction rub. No gallop. No S3 or S4 sounds.  Pulmonary:     Effort: Pulmonary effort is normal. No tachypnea, accessory muscle usage or respiratory distress.     Breath sounds: No stridor. No decreased breath sounds, wheezing, rhonchi or rales.     Comments: Distant breath sounds Chest:     Chest wall: No tenderness.  Abdominal:     General: Bowel sounds are normal. There is no distension.     Palpations: Abdomen is soft. Abdomen is not rigid.     Tenderness: There is no abdominal tenderness. There is no guarding or rebound.  Musculoskeletal:        General: Normal range of motion.     Cervical back: Normal range of motion and neck supple. No edema, erythema or rigidity. No muscular tenderness. Normal range of motion.  Lymphadenopathy:     Head:     Right side of head: No submental or submandibular adenopathy.     Left side of head: No submental or submandibular adenopathy.     Cervical: No cervical adenopathy.  Skin:    General: Skin is warm and dry.     Coloration: Skin is not pale.     Findings: No rash.     Nails: There is no clubbing.     Comments: Erythematous rash both forearms is improved  Groin rash compatible with tinea cruris  Bilateral onychomycosis both feet  Neurological:     Mental Status: He is alert and oriented to person, place, and time.     Sensory: No sensory deficit.  Psychiatric:        Speech: Speech normal.        Behavior: Behavior normal.      Results for orders placed or  performed in visit on 03/02/22  POCT glucose (manual entry)  Result Value Ref Range   POC Glucose 165 (A) 70 - 99 mg/dl       The ASCVD Risk score (Arnett DK, et  al., 2019) failed to calculate for the following reasons:   The patient has a prior MI or stroke diagnosis    Assessment & Plan:   Problem List Items Addressed This Visit       Cardiovascular and Mediastinum   Hypertension (Chronic)    Hypertension well-controlled continue losartan and metoprolol      Relevant Medications   furosemide (LASIX) 40 MG tablet   losartan (COZAAR) 100 MG tablet   metoprolol succinate (TOPROL-XL) 50 MG 24 hr tablet   atorvastatin (LIPITOR) 10 MG tablet   Other Relevant Orders   BMP8+eGFR   Aortic root dilation (HCC)   Relevant Medications   furosemide (LASIX) 40 MG tablet   losartan (COZAAR) 100 MG tablet   metoprolol succinate (TOPROL-XL) 50 MG 24 hr tablet   atorvastatin (LIPITOR) 10 MG tablet     Respiratory   Chronic respiratory failure with hypoxia (HCC)    Continue with oxygen therapy has follow-up CT scan      COPD with chronic bronchitis    Continue inhaled medications and low-dose prednisone      Relevant Medications   albuterol (PROVENTIL) (2.5 MG/3ML) 0.083% nebulizer solution   albuterol (VENTOLIN HFA) 108 (90 Base) MCG/ACT inhaler   Multiple lung nodules on CT    Repeat CT imaging      Malignant neoplasm of unspecified part of right bronchus or lung (HCC)    Repeat imaging CT      RESOLVED: Community acquired pneumonia    This has resolved      Relevant Medications   albuterol (PROVENTIL) (2.5 MG/3ML) 0.083% nebulizer solution   albuterol (VENTOLIN HFA) 108 (90 Base) MCG/ACT inhaler   RESOLVED: Respiratory failure (HCC)     Endocrine   Type 2 diabetes mellitus without complication, without long-term current use of insulin (HCC) - Primary    Check A1c continue glipizide      Relevant Medications   glipiZIDE (GLUCOTROL XL) 5 MG 24 hr tablet    losartan (COZAAR) 100 MG tablet   atorvastatin (LIPITOR) 10 MG tablet   Other Relevant Orders   POCT glucose (manual entry) (Completed)   BMP8+eGFR   Lipid panel   Hemoglobin A1c     Musculoskeletal and Integument   Onychomycosis    Improved with recent foot exam and treatment        Other   Malnutrition of moderate degree    Improved      Alcohol dependence in remission (HCC)    Remains in remission     Return in about 3 months (around 05/31/2022) for htn, diabetes, copd.    [name], MD

## 2022-03-01 ENCOUNTER — Telehealth (HOSPITAL_COMMUNITY): Payer: Self-pay

## 2022-03-02 ENCOUNTER — Other Ambulatory Visit: Payer: Self-pay

## 2022-03-02 ENCOUNTER — Ambulatory Visit: Payer: Medicaid Other | Attending: Critical Care Medicine | Admitting: Critical Care Medicine

## 2022-03-02 ENCOUNTER — Encounter: Payer: Self-pay | Admitting: Critical Care Medicine

## 2022-03-02 VITALS — BP 128/60 | HR 74 | Ht 69.0 in | Wt 214.0 lb

## 2022-03-02 DIAGNOSIS — J9622 Acute and chronic respiratory failure with hypercapnia: Secondary | ICD-10-CM

## 2022-03-02 DIAGNOSIS — B351 Tinea unguium: Secondary | ICD-10-CM | POA: Diagnosis not present

## 2022-03-02 DIAGNOSIS — E119 Type 2 diabetes mellitus without complications: Secondary | ICD-10-CM | POA: Diagnosis not present

## 2022-03-02 DIAGNOSIS — J9611 Chronic respiratory failure with hypoxia: Secondary | ICD-10-CM

## 2022-03-02 DIAGNOSIS — J189 Pneumonia, unspecified organism: Secondary | ICD-10-CM

## 2022-03-02 DIAGNOSIS — R918 Other nonspecific abnormal finding of lung field: Secondary | ICD-10-CM

## 2022-03-02 DIAGNOSIS — J9621 Acute and chronic respiratory failure with hypoxia: Secondary | ICD-10-CM | POA: Diagnosis not present

## 2022-03-02 DIAGNOSIS — F1021 Alcohol dependence, in remission: Secondary | ICD-10-CM | POA: Diagnosis not present

## 2022-03-02 DIAGNOSIS — I77810 Thoracic aortic ectasia: Secondary | ICD-10-CM | POA: Insufficient documentation

## 2022-03-02 DIAGNOSIS — C3491 Malignant neoplasm of unspecified part of right bronchus or lung: Secondary | ICD-10-CM | POA: Insufficient documentation

## 2022-03-02 DIAGNOSIS — E440 Moderate protein-calorie malnutrition: Secondary | ICD-10-CM

## 2022-03-02 DIAGNOSIS — J4489 Other specified chronic obstructive pulmonary disease: Secondary | ICD-10-CM | POA: Diagnosis not present

## 2022-03-02 DIAGNOSIS — I10 Essential (primary) hypertension: Secondary | ICD-10-CM | POA: Diagnosis not present

## 2022-03-02 LAB — GLUCOSE, POCT (MANUAL RESULT ENTRY): POC Glucose: 165 mg/dl — AB (ref 70–99)

## 2022-03-02 MED ORDER — LOSARTAN POTASSIUM 100 MG PO TABS
100.0000 mg | ORAL_TABLET | Freq: Every day | ORAL | 2 refills | Status: DC
  Filled 2022-03-02 – 2022-04-12 (×2): qty 90, 90d supply, fill #0

## 2022-03-02 MED ORDER — VENTOLIN HFA 108 (90 BASE) MCG/ACT IN AERS
2.0000 | INHALATION_SPRAY | Freq: Four times a day (QID) | RESPIRATORY_TRACT | 1 refills | Status: DC | PRN
  Filled 2022-03-02: qty 18, 25d supply, fill #0
  Filled 2022-05-25: qty 54, 75d supply, fill #0

## 2022-03-02 MED ORDER — PREGABALIN 100 MG PO CAPS
100.0000 mg | ORAL_CAPSULE | Freq: Two times a day (BID) | ORAL | 2 refills | Status: DC
  Filled 2022-03-02 – 2022-03-15 (×2): qty 60, 30d supply, fill #0
  Filled 2022-04-12: qty 60, 30d supply, fill #1
  Filled 2022-05-10 – 2022-05-14 (×3): qty 60, 30d supply, fill #2
  Filled 2022-05-25 – 2022-05-28 (×2): qty 60, 30d supply, fill #0

## 2022-03-02 MED ORDER — ATORVASTATIN CALCIUM 10 MG PO TABS
10.0000 mg | ORAL_TABLET | Freq: Every day | ORAL | 3 refills | Status: DC
  Filled 2022-03-02 – 2022-03-15 (×2): qty 90, 90d supply, fill #0
  Filled 2022-06-23 (×3): qty 90, 90d supply, fill #1
  Filled 2022-06-30: qty 90, 90d supply, fill #0

## 2022-03-02 MED ORDER — METOPROLOL SUCCINATE ER 50 MG PO TB24
50.0000 mg | ORAL_TABLET | Freq: Every day | ORAL | 2 refills | Status: DC
  Filled 2022-03-02 – 2022-05-07 (×3): qty 90, 90d supply, fill #0

## 2022-03-02 MED ORDER — FUROSEMIDE 40 MG PO TABS
40.0000 mg | ORAL_TABLET | Freq: Every day | ORAL | 1 refills | Status: DC
  Filled 2022-03-02 – 2022-03-15 (×2): qty 30, 30d supply, fill #0

## 2022-03-02 MED ORDER — ALBUTEROL SULFATE (2.5 MG/3ML) 0.083% IN NEBU
2.5000 mg | INHALATION_SOLUTION | RESPIRATORY_TRACT | 1 refills | Status: DC | PRN
  Filled 2022-03-02 – 2022-04-12 (×2): qty 270, 15d supply, fill #0
  Filled 2022-04-15 – 2022-07-19 (×2): qty 270, 15d supply, fill #1

## 2022-03-02 MED ORDER — GLIPIZIDE ER 5 MG PO TB24
5.0000 mg | ORAL_TABLET | Freq: Every day | ORAL | 1 refills | Status: DC
  Filled 2022-03-02: qty 30, 30d supply, fill #0
  Filled 2022-03-15: qty 60, 60d supply, fill #0

## 2022-03-02 NOTE — Patient Instructions (Signed)
Obtain labs today to assess kidneys potassium and diabetes and check cholesterol  Start atorvastatin 1 pill daily for cholesterol  All other medications refilled no changes  Return to [name] 3 months for close follow-up

## 2022-03-02 NOTE — Assessment & Plan Note (Signed)
Continue with oxygen therapy has follow-up CT scan

## 2022-03-02 NOTE — Assessment & Plan Note (Signed)
Check A1c continue glipizide

## 2022-03-02 NOTE — Assessment & Plan Note (Signed)
Hypertension well-controlled continue losartan and metoprolol

## 2022-03-02 NOTE — Assessment & Plan Note (Signed)
This has resolved.

## 2022-03-02 NOTE — Assessment & Plan Note (Signed)
Continue inhaled medications and low-dose prednisone

## 2022-03-02 NOTE — Assessment & Plan Note (Signed)
Repeat CT imaging

## 2022-03-02 NOTE — Assessment & Plan Note (Signed)
Remains in remission

## 2022-03-02 NOTE — Assessment & Plan Note (Signed)
Improved with recent foot exam and treatment

## 2022-03-02 NOTE — Assessment & Plan Note (Signed)
Repeat imaging CT

## 2022-03-02 NOTE — Assessment & Plan Note (Signed)
Improved

## 2022-03-03 ENCOUNTER — Other Ambulatory Visit: Payer: Self-pay

## 2022-03-03 LAB — LIPID PANEL
Chol/HDL Ratio: 3 ratio (ref 0.0–5.0)
Cholesterol, Total: 158 mg/dL (ref 100–199)
HDL: 52 mg/dL (ref >39)
LDL Chol Calc (NIH): 82 mg/dL (ref 0–99)
Triglycerides: 140 mg/dL (ref 0–149)
VLDL Cholesterol Cal: 24 mg/dL (ref 5–40)

## 2022-03-03 LAB — BMP8+EGFR
BUN/Creatinine Ratio: 20 (ref 10–24)
BUN: 18 mg/dL (ref 8–27)
CO2: 25 mmol/L (ref 20–29)
Calcium: 9.3 mg/dL (ref 8.6–10.2)
Chloride: 105 mmol/L (ref 96–106)
Creatinine, Ser: 0.89 mg/dL (ref 0.76–1.27)
Glucose: 143 mg/dL — ABNORMAL HIGH (ref 70–99)
Potassium: 4.5 mmol/L (ref 3.5–5.2)
Sodium: 147 mmol/L — ABNORMAL HIGH (ref 134–144)
eGFR: 98 mL/min/{1.73_m2} (ref >59)

## 2022-03-03 LAB — HEMOGLOBIN A1C
Est. average glucose Bld gHb Est-mCnc: 137 mg/dL
Hgb A1c MFr Bld: 6.4 % — ABNORMAL HIGH (ref 4.8–5.6)

## 2022-03-03 NOTE — Telephone Encounter (Signed)
Spoke to pt regarding home visit, since he is being seen by PCP this week, I will f/u and see him next week in the home.  He reports doing well, no changes since I seen him last.    [name], [place] 03/01/2022

## 2022-03-03 NOTE — Progress Notes (Signed)
Let pt know cholesterol diabetes at goal all other labs normal

## 2022-03-04 ENCOUNTER — Ambulatory Visit: Payer: Medicaid Other | Admitting: Physician Assistant

## 2022-03-05 ENCOUNTER — Other Ambulatory Visit (HOSPITAL_COMMUNITY): Payer: Self-pay

## 2022-03-05 ENCOUNTER — Telehealth: Payer: Self-pay

## 2022-03-05 DIAGNOSIS — F79 Unspecified intellectual disabilities: Secondary | ICD-10-CM | POA: Diagnosis not present

## 2022-03-05 DIAGNOSIS — J9601 Acute respiratory failure with hypoxia: Secondary | ICD-10-CM | POA: Diagnosis not present

## 2022-03-05 DIAGNOSIS — J9621 Acute and chronic respiratory failure with hypoxia: Secondary | ICD-10-CM | POA: Diagnosis not present

## 2022-03-05 NOTE — Telephone Encounter (Signed)
-----   Message from [name], MD sent at 03/03/2022  5:54 AM EST ----- Let pt know cholesterol diabetes at goal all other labs normal

## 2022-03-05 NOTE — Telephone Encounter (Signed)
Pt was called and is aware of results, DOB was confirmed.  ?

## 2022-03-07 DIAGNOSIS — J9621 Acute and chronic respiratory failure with hypoxia: Secondary | ICD-10-CM | POA: Diagnosis not present

## 2022-03-07 DIAGNOSIS — F79 Unspecified intellectual disabilities: Secondary | ICD-10-CM | POA: Diagnosis not present

## 2022-03-07 DIAGNOSIS — J9601 Acute respiratory failure with hypoxia: Secondary | ICD-10-CM | POA: Diagnosis not present

## 2022-03-08 ENCOUNTER — Encounter (HOSPITAL_COMMUNITY): Payer: Self-pay

## 2022-03-08 ENCOUNTER — Other Ambulatory Visit (HOSPITAL_COMMUNITY): Payer: Self-pay

## 2022-03-10 ENCOUNTER — Other Ambulatory Visit (HOSPITAL_COMMUNITY): Payer: Self-pay

## 2022-03-10 ENCOUNTER — Other Ambulatory Visit: Payer: Self-pay

## 2022-03-10 NOTE — Progress Notes (Signed)
Paramedicine Encounter    Patient ID: James [name], male    DOB: Jan 01, 1961, 62 y.o.   MRN: [id number]   Complaints-slight sob -wheezing to right side and upper left   Edema-slight to legs at times   Compliance with meds-yes  Pill box filled-no-does not want to use one  If so, by whom-  Refills needed-?  Pt reports his breathing is doing the same. He is wheezy to rt side upper and lower and upper left side.  He reports cough is the same.  He said he just got done using inhaler and neb machine.     He said he gets some swelling to his legs sometimes.  Advised him to elevate legs when that happens.  Due to his situation I dont think there is much education on low sodium diet that will be beneficial.   Had to review his list verbally-he does not have his meds here. He left them in a friends truck. His truck is broken down at this time.  He said they were suppose to send out 2 meds that was ordered but I am not sure which ones he is talking about. He said he had all of his meds when we went over his list but then said he was waiting on 2.  I called pharmacy -the last delivery was the pantoprazole.  I wrote down med list and once he gets his meds back--I told him ne needs to see his friend asap to get his meds back-to check off which ones he has and then to call me to let me know to figure out which ones he needed.   Still not checking CBG-showed him again how to use his glucometer.   His machine got [place] was 314-  BP 134/70   Pulse 68   Resp (!) 22   SpO2 98%    Patient Care Team: [name], MD as PCP - General (Pulmonary Disease) O'Neal, Cassie Freer, MD as PCP - Cardiology (Cardiology) [name], RN as Case Manager Greg Cutter, LCSW as [place] Management (Licensed Clinical Social Worker)  Patient Active Problem List   Diagnosis Date Noted   Malignant neoplasm of unspecified part of right bronchus or lung ([place]) 03/02/2022    Alcohol dependence in remission ([place]) 03/02/2022   Aortic root dilation ([place]) 03/02/2022   Vision changes 12/29/2021   Oropharyngeal dysphagia 12/29/2021   Onychomycosis 12/29/2021   Malnutrition of moderate degree 12/05/2021   Tinea cruris 07/21/2021   Multiple lung nodules on CT 06/11/2021   COPD with chronic bronchitis 02/01/2021   GERD (gastroesophageal reflux disease) 02/01/2021   Neuropathy 09/29/2020   Bronchiectasis ([place]) 09/24/2019   Type 2 diabetes mellitus without complication, without long-term current use of insulin ([place]) 07/24/2019   Other atopic dermatitis 05/01/2019   Chronic respiratory failure with hypoxia ([place]) 04/23/2019   Intellectual disability 04/23/2019   Hypertension 05/17/2016   Former tobacco use 04/06/2016   COPD mixed type ([place]) 03/26/2016    Current Outpatient Medications:    albuterol (PROVENTIL) (2.5 MG/3ML) 0.083% nebulizer solution, Take 3 mLs (2.5 mg total) by nebulization every 4 (four) hours as needed for wheezing or shortness of breath., Disp: 270 mL, Rfl: 1   albuterol (VENTOLIN HFA) 108 (90 Base) MCG/ACT inhaler, Inhale 2 puffs into the lungs every 6 (six) hours as needed for wheezing or shortness of breath., Disp: 54 g, Rfl: 1   atorvastatin (LIPITOR) 10 MG tablet, Take 1 tablet (10  mg total) by mouth daily., Disp: 90 tablet, Rfl: 3   Budeson-Glycopyrrol-Formoterol (BREZTRI AEROSPHERE) 160-9-4.8 MCG/ACT AERO, Inhale 2 puffs into the lungs in the morning and at bedtime., Disp: 10.7 g, Rfl: 11   furosemide (LASIX) 40 MG tablet, Take 1 tablet (40 mg total) by mouth daily., Disp: 30 tablet, Rfl: 1   glipiZIDE (GLUCOTROL XL) 5 MG 24 hr tablet, Take 1 tablet (5 mg total) by mouth daily with breakfast., Disp: 60 tablet, Rfl: 1   losartan (COZAAR) 100 MG tablet, Take 1 tablet (100 mg total) by mouth daily., Disp: 90 tablet, Rfl: 2   metoprolol succinate (TOPROL-XL) 50 MG 24 hr tablet, Take 1 tablet (50 mg total) by mouth daily. Take with or immediately  following a meal., Disp: 90 tablet, Rfl: 2   pantoprazole (PROTONIX) 40 MG tablet, Take 1 tablet (40 mg total) by mouth daily., Disp: 60 tablet, Rfl: 6   predniSONE (DELTASONE) 10 MG tablet, Take 1 tablet (10 mg total) by mouth daily., Disp: 60 tablet, Rfl: 6   pregabalin (LYRICA) 100 MG capsule, Take 1 capsule (100 mg total) by mouth 2 (two) times daily., Disp: 60 capsule, Rfl: 2   nystatin cream (MYCOSTATIN), Apply to affected areas 3 (three) times daily., Disp: 30 g, Rfl: 2   Vitamin D3 (VITAMIN D) 25 MCG tablet, Take 1 tablet by mouth daily. (Patient not taking: Reported on 03/02/2022), Disp: 60 tablet, Rfl: 2 No Known Allergies    Social History   Socioeconomic History   Marital status: Divorced    Spouse name: Not on file   Number of children: Not on file   Years of education: Not on file   Highest education level: Not on file  Occupational History   Occupation: Dealer   Occupation: Curator  Tobacco Use   Smoking status: Former    Packs/day: 1.50    Years: 40.00    Total pack years: 60.00    Types: Cigarettes    Quit date: 2014    Years since quitting: 10.1   Smokeless tobacco: Former    Types: Nurse, children's Use: Never used  Substance and Sexual Activity   Alcohol use: Not Currently    Alcohol/week: 28.0 standard drinks of alcohol    Types: 28 Cans of beer per week    Comment: 4 beers a night previously   Drug use: Not Currently    Types: Marijuana    Comment: daily; quit using crack and methamphetamine about 5 months ago    Sexual activity: Not Currently    Partners: Female  Other Topics Concern   Not on file  Social History Narrative   Lives alone near [place] Determinants of Health   Financial Resource Strain: Low Risk  (12/25/2020)   Overall Financial Resource Strain (CARDIA)    Difficulty of Paying Living Expenses: Not very hard  Recent Concern: Financial Resource Strain - High Risk (10/29/2020)   Overall Financial Resource  Strain (CARDIA)    Difficulty of Paying Living Expenses: Hard  Food Insecurity: No Food Insecurity (02/17/2022)   Hunger Vital Sign    Worried About Running Out of Food in the Last Year: Never true    [place] in the Last Year: Never true  Transportation Needs: Unmet Transportation Needs (02/17/2022)   PRAPARE - Transportation    Lack of Transportation (Medical): Yes    Lack of Transportation (Non-Medical): Yes  Physical Activity: Inactive (12/11/2020)   Exercise Vital Sign  Days of Exercise per Week: 0 days    Minutes of Exercise per Session: 0 min  Stress: No Stress Concern Present (12/25/2020)   [place]    Feeling of Stress : Not at all  Social Connections: Moderately Integrated (11/13/2020)   Social Connection and Isolation Panel [NHANES]    Frequency of Communication with Friends and Family: More than three times a week    Frequency of Social Gatherings with Friends and Family: Once a week    Attends Religious Services: 1 to 4 times per year    Active Member of [employer] or Organizations: Yes    Attends Archivist Meetings: 1 to 4 times per year    Marital Status: Divorced  Intimate Partner Violence: Not At Risk (12/03/2021)   Humiliation, Afraid, Rape, and Kick questionnaire    Fear of Current or Ex-Partner: No    Emotionally Abused: No    Physically Abused: No    Sexually Abused: No    Physical Exam      Future Appointments  Date Time Provider [place]  03/19/2022  9:00 AM [name], RN [facility] None  04/01/2022  2:20 PM Irene Shipper, MD [facility]  04/13/2022 11:15 AM Lorenda Peck, DPM [facility]  05/12/2022  8:00 AM O'Neal, Cassie Freer, MD [facility] None  06/09/2022  9:30 AM [name], MD [facility] None       [name], Paramedic [phone] [facility] Paramedic  03/10/22

## 2022-03-11 ENCOUNTER — Other Ambulatory Visit (HOSPITAL_COMMUNITY): Payer: Self-pay

## 2022-03-12 ENCOUNTER — Other Ambulatory Visit: Payer: Self-pay

## 2022-03-15 ENCOUNTER — Other Ambulatory Visit: Payer: Self-pay

## 2022-03-15 ENCOUNTER — Telehealth (HOSPITAL_COMMUNITY): Payer: Self-pay

## 2022-03-15 NOTE — Telephone Encounter (Signed)
Pt reached back out to me today to let me know which meds he does not have on hand and asked for assistance for the refills.   Contacted community health  pharmacy to request the following for refills and asked them to mail it out-   Glipizide Furosemide Atorvastatin Prednisone  pregabalin   [name], Paramedic [phone] 03/15/22

## 2022-03-17 ENCOUNTER — Other Ambulatory Visit: Payer: Self-pay

## 2022-03-18 ENCOUNTER — Other Ambulatory Visit (HOSPITAL_BASED_OUTPATIENT_CLINIC_OR_DEPARTMENT_OTHER): Payer: Self-pay

## 2022-03-18 ENCOUNTER — Telehealth (HOSPITAL_COMMUNITY): Payer: Self-pay

## 2022-03-18 ENCOUNTER — Other Ambulatory Visit (HOSPITAL_COMMUNITY): Payer: Self-pay

## 2022-03-18 ENCOUNTER — Other Ambulatory Visit: Payer: Self-pay

## 2022-03-18 NOTE — Telephone Encounter (Signed)
Pt left me a VM last night reporting he hasn't received his meds yet. I called pharmacy to see what happened--he said b/c there is a controlled med in the delivery it needs to be signed for.  It was attempted to be delivered twice- I did ask if they contact the patient first and he said they normally do.  So right now the package is not back at pharmacy yet-it should be here by this afternoon but the pharmacy will reach out at that point and see if pt wants to come get it or try again for delivery.  I tried calling pt to let him know this plan but he did not answer.    [name], [place] 03/18/2022

## 2022-03-19 ENCOUNTER — Other Ambulatory Visit (HOSPITAL_COMMUNITY): Payer: Self-pay

## 2022-03-19 ENCOUNTER — Other Ambulatory Visit: Payer: Medicaid Other | Admitting: *Deleted

## 2022-03-19 ENCOUNTER — Encounter: Payer: Self-pay | Admitting: *Deleted

## 2022-03-19 ENCOUNTER — Other Ambulatory Visit: Payer: Self-pay

## 2022-03-19 NOTE — Patient Instructions (Addendum)
Visit Information  James [name] was given information about Medicaid Managed Care team care coordination services as a part of their Healthy [facility] Medicaid benefit. James [name] verbally consented to engagement with the [facility] Managed Care team.   If you are experiencing a medical emergency, please call 911 or report to your local emergency department or urgent care.   If you have a non-emergency medical problem during routine business hours, please contact your provider's office and ask to speak with a nurse.   For questions related to your Healthy [facility] health plan, please call: [phone] or visit the homepage here: [email or web address]  If you would like to schedule transportation through your Healthy [facility] plan, please call the following number at least 2 days in advance of your appointment: [phone]  For information about your ride after you set it up, call Ride Assist at [phone]. Use this number to activate a Will Call pickup, or if your transportation is late for a scheduled pickup. Use this number, too, if you need to make a change or cancel a previously scheduled reservation.  If you need transportation services right away, call [phone]. The after-hours call center is staffed 24 hours to handle ride assistance and urgent reservation requests (including discharges) 365 days a year. Urgent trips include sick visits, hospital discharge requests and life-sustaining treatment.  Call the [place] at [phone], at any time, 24 hours a day, 7 days a week. If you are in danger or need immediate medical attention call 911.  If you would like help to quit smoking, call 1-800-QUIT-NOW (830)040-0599) OR Espaol: 1-855-Djelo-Ya HD:1601594) o para ms informacin haga clic aqu or Text READY to 200-400 to register via text  James [name],   Please see education materials related to COPD provided as print  materials.   The patient verbalized understanding of instructions, educational materials, and care plan provided today and agreed to receive a mailed copy of patient instructions, educational materials, and care plan.   Telephone follow up appointment with Managed Medicaid care management team member scheduled for:04/19/22 @ [name], [name] Care Coordinator   Following is a copy of your plan of care:  Care Plan : RN Care Manager Plan of Care  Updates made by [name], RN since 03/19/2022 12:00 AM     Problem: Chronic Disease Management needs in patient with COPD   Priority: High     Long-Range Goal: Plan of care established for for Chronic Disease management for patient with COPD   Start Date: 11/13/2020  Expected End Date: 06/04/2022  Priority: High  Note:   Current Barriers:  Care Coordination needs related to Limited access to food  Chronic Disease Management support and education needs related to COPD [name] has not received requested medication refills. Pharmacy made two attempts to deliver, now requiring signature for delivery. He is completely out of prednisone.   RNCM Clinical Goal(s):  Patient will verbalize understanding of plan for management of COPD as evidenced by taking all medications as prescribed, attending all scheduled appointments and contacting provider with questions and concerns take all medications exactly as prescribed and will call provider for medication related questions as evidenced by refilling medications and taking as prescribed    attend all scheduled medical appointments: 04/01/22 with [facility] and 04/13/22 with [facility] as evidenced by provider notes in EMR        demonstrate improved adherence to  prescribed treatment plan for COPD as evidenced by taking all medications as prescribed, attending all scheduled appointments and contacting provider with questions and concerns continue  to work with RN Care Manager and/or Social Worker to address care management and care coordination needs related to COPD as evidenced by adherence to CM Team Scheduled appointments     through collaboration with Consulting civil engineer, provider, and care team.   Interventions: Inter-disciplinary care team collaboration (see longitudinal plan of care) Evaluation of current treatment plan related to  self management and patient's adherence to plan as established by provider Assisted with scheduling transportation to upcoming appointments, 04/01/22 to [place] up from home time 1:15pm and pick up returning home at 3:45pm; 04/13/22 to [facility]-pick up from home at 10:15am and return to home pick up at 12:30pm   SDOH Barriers (Status:  Goal on track:  Yes. Patient interviewed and SDOH assessment performed        SDOH Interventions    Flowsheet Row Patient Outreach Telephone from 02/17/2022 in [place] Patient Outreach Telephone from 01/18/2022 in [place] Telephone from 01/05/2022 in [place] Patient Outreach Telephone from 12/17/2021 in [facility] to Hosp-Admission (Discharged) from 11/28/2021 in [place] Patient Outreach Telephone from 09/09/2021 in [place] Interventions        Food Insecurity Interventions Intervention Not Indicated Intervention Not Indicated -- Intervention Not Indicated -- --  Housing Interventions Intervention Not Indicated -- -- -- Intervention Not Indicated Intervention Not Indicated  Transportation Interventions Payor Benefit  [Assisted with arranging transportation] -- Other (Comment)  [assisted with arranging transportation to appointments on 01/12/22 and 01/27/22] Other (Comment)  [RNCM assisted with arranging transportation to upcoming appointments] -- --   Utilities Interventions Other (Comment)  [Collaborated with BSW for utility assistance] Intervention Not Indicated -- -- -- --  Alcohol Usage Interventions -- -- -- -- -- Intervention Not Indicated (Score <7)       Patient interviewed and appropriate assessments performed Discussed plans with patient for ongoing care management follow up and provided patient with direct contact information for care management team-   COPD: (Status: Goal on Track (progressing): YES.) Long Term Goal  Reviewed medications with patient, Advised patient to self assesses COPD action plan zone and make appointment with provider if in the yellow zone for 48 hours without improvement Provided education about and advised patient to utilize infection prevention strategies to reduce risk of respiratory infection Discussed the importance of adequate rest and management of fatigue with COPD Advised patient to avoid going out in the cold, cover mouth with a scarf when going out Infection prevention reviewed Encouraged patient to continue to avoid drinking alcohol and avoid smoke RNCM contacted pharmacy to inquire about previously requested medications, patient will have his sister take him to pick up medications today Collaborate with PCP for 90 day prescriptions when appropriate   Patient Goals/Self-Care Activities: Take medications as prescribed   Attend all scheduled provider appointments Call pharmacy for medication refills 3-7 days in advance of running out of medications Perform all self care activities independently  Call provider office for new concerns or questions  - avoid second hand smoke - limit outdoor activity during cold weather - keep follow-up appointments:  01/27/22 with Pulmonology - eat healthy/prescribed diet: DASH - get at least 7 to 8 hours of sleep at night

## 2022-03-19 NOTE — Patient Outreach (Signed)
Medicaid Managed Care   Nurse Care Manager Note  03/19/2022 Name:  James [name] MRN:  [id number] DOB:  [birth year]  James [name] is an 62 y.o. year old male who is a primary patient of [name], MD.  The [facility] Managed Care Coordination team was consulted for assistance with:    COPD  James [name] was given information about Medicaid Managed Care Coordination team services today. [name] Patient agreed to services and verbal consent obtained.  Engaged with patient by telephone for follow up visit in response to provider referral for case management and/or care coordination services.   Assessments/Interventions:  Review of past medical history, allergies, medications, health status, including review of consultants reports, laboratory and other test data, was performed as part of comprehensive evaluation and provision of chronic care management services.  SDOH (Social Determinants of Health) assessments and interventions performed: SDOH Interventions    Flowsheet Row Patient Outreach Telephone from 03/19/2022 in [place] Patient Outreach Telephone from 02/17/2022 in [place] Patient Outreach Telephone from 01/18/2022 in [place] Telephone from 01/05/2022 in [place] Patient Outreach Telephone from 12/17/2021 in [place] ED to Hosp-Admission (Discharged) from 11/28/2021 in [place] Interventions        Food Insecurity Interventions -- Intervention Not Indicated Intervention Not Indicated -- Intervention Not Indicated --  Housing Interventions -- Intervention Not Indicated -- -- -- Intervention Not Indicated  Transportation Interventions Payor Benefit  [assisted with arranging transportation to upcoming appointments] Payor Benefit  [Assisted with arranging transportation] -- Other  (Comment)  [assisted with arranging transportation to appointments on 01/12/22 and 01/27/22] Other (Comment)  [RNCM assisted with arranging transportation to upcoming appointments] --  Utilities Interventions -- Other (Comment)  [Collaborated with BSW for utility assistance] Intervention Not Indicated -- -- --       Care Plan  No Known Allergies  Medications Reviewed Today     Reviewed by [name], RN (Registered Nurse) on 03/19/22 at [place] List Status: <None>   Medication Order Taking? Sig Documenting Provider Last Dose Status Informant  albuterol (PROVENTIL) (2.5 MG/3ML) 0.083% nebulizer solution [id number] Yes Take 3 mLs (2.5 mg total) by nebulization every 4 (four) hours as needed for wheezing or shortness of breath. [name], MD Taking Active   albuterol (VENTOLIN HFA) 108 [phone] Base) MCG/ACT inhaler [id number] Yes Inhale 2 puffs into the lungs every 6 (six) hours as needed for wheezing or shortness of breath. [name], MD Taking Active   atorvastatin (LIPITOR) 10 MG tablet [id number] No Take 1 tablet (10 mg total) by mouth daily.  Patient not taking: Reported on 03/19/2022   [name], MD Not Taking Active   Budeson-Glycopyrrol-Formoterol [facility] AEROSPHERE) 160-9-4.8 MCG/ACT [name] [id number] Yes Inhale 2 puffs into the lungs in the morning and at bedtime. Maryjane Hurter, MD Taking Active Pharmacy Records           Med Note (Derin Granquist A   Wed Nov 18, 2021  3:30 PM)    furosemide (LASIX) 40 MG tablet [id number] Yes Take 1 tablet (40 mg total) by mouth daily. [name], MD Taking Active   glipiZIDE (GLUCOTROL XL) 5 MG 24 hr tablet [id number] Yes Take 1 tablet (5 mg total) by mouth daily with breakfast. [name], MD Taking Active   losartan (COZAAR) 100  MG tablet [id number] Yes Take 1 tablet (100 mg total) by mouth daily. [name], MD Taking Active   metoprolol succinate (TOPROL-XL) 50 MG 24 hr tablet [id number] Yes Take 1 tablet  (50 mg total) by mouth daily. Take with or immediately following a meal. [name], MD Taking Active   nystatin cream (MYCOSTATIN) [id number] Yes Apply to affected areas 3 (three) times daily. [name], MD Taking Active   pantoprazole (PROTONIX) 40 MG tablet [id number] Yes Take 1 tablet (40 mg total) by mouth daily. [name], MD Taking Active   predniSONE (DELTASONE) 10 MG tablet [id number] No Take 1 tablet (10 mg total) by mouth daily.  Patient not taking: Reported on 03/19/2022   [name], MD Not Taking Active            Med Note (Skai Lickteig A   Fri Mar 19, 2022  9:16 AM) Needs refill  pregabalin (LYRICA) 100 MG capsule [id number] No Take 1 capsule (100 mg total) by mouth 2 (two) times daily.  Patient not taking: Reported on 03/19/2022   [name], MD Not Taking Active            Med Note (Alyscia Carmon A   Fri Mar 19, 2022  9:22 AM) Needs refill  Vitamin D3 (VITAMIN D) 25 MCG tablet [id number] No Take 1 tablet by mouth daily.  Patient not taking: Reported on 03/02/2022   [name], MD Not Taking Active Pharmacy Records           Med Note Harvel Ricks   Tue Oct 13, 2021 10:31 AM)              Patient Active Problem List   Diagnosis Date Noted   Malignant neoplasm of unspecified part of right bronchus or lung ([place]) 03/02/2022   Alcohol dependence in remission ([place]) 03/02/2022   Aortic root dilation ([place]) 03/02/2022   Vision changes 12/29/2021   Oropharyngeal dysphagia 12/29/2021   Onychomycosis 12/29/2021   Malnutrition of moderate degree 12/05/2021   Tinea cruris 07/21/2021   Multiple lung nodules on CT 06/11/2021   COPD with chronic bronchitis 02/01/2021   GERD (gastroesophageal reflux disease) 02/01/2021   Neuropathy 09/29/2020   Bronchiectasis ([place]) 09/24/2019   Type 2 diabetes mellitus without complication, without long-term current use of insulin ([place]) 07/24/2019   Other atopic dermatitis 05/01/2019   Chronic  respiratory failure with hypoxia ([place]) 04/23/2019   Intellectual disability 04/23/2019   Hypertension 05/17/2016   Former tobacco use 04/06/2016   COPD mixed type ([place]) 03/26/2016    Conditions to be addressed/monitored per PCP order:  COPD  Care Plan : [place] of Care  Updates made by [name], RN since 03/19/2022 12:00 AM     Problem: Chronic Disease Management needs in patient with COPD   Priority: High     Long-Range Goal: Plan of care established for for Chronic Disease management for patient with COPD   Start Date: 11/13/2020  Expected End Date: 06/04/2022  Priority: High  Note:   Current Barriers:  Care Coordination needs related to Limited access to food  Chronic Disease Management support and education needs related to COPD James [name] has not received requested medication refills. Pharmacy made two attempts to deliver, now requiring signature for delivery. He is completely out of prednisone.   RNCM Clinical Goal(s):  Patient will verbalize understanding of plan for management of COPD as evidenced by taking all medications as  prescribed, attending all scheduled appointments and contacting provider with questions and concerns take all medications exactly as prescribed and will call provider for medication related questions as evidenced by refilling medications and taking as prescribed    attend all scheduled medical appointments: 04/01/22 with [facility] and 04/13/22 with [facility] as evidenced by provider notes in EMR        demonstrate improved adherence to prescribed treatment plan for COPD as evidenced by taking all medications as prescribed, attending all scheduled appointments and contacting provider with questions and concerns continue to work with RN Care Manager and/or Social Worker to address care management and care coordination needs related to COPD as evidenced by adherence to CM Team Scheduled appointments     through collaboration with RN  Care manager, provider, and care team.   Interventions: Inter-disciplinary care team collaboration (see longitudinal plan of care) Evaluation of current treatment plan related to  self management and patient's adherence to plan as established by provider Assisted with scheduling transportation to upcoming appointments, 04/01/22 to [place] up from home time 1:15pm and pick up returning home at 3:45pm; 04/13/22 to [facility]-pick up from home at 10:15am and return to home pick up at 12:30pm   SDOH Barriers (Status:  Goal on track:  Yes. Patient interviewed and SDOH assessment performed        SDOH Interventions    Flowsheet Row Patient Outreach Telephone from 02/17/2022 in [place] Patient Outreach Telephone from 01/18/2022 in [place] Telephone from 01/05/2022 in [place] Patient Outreach Telephone from 12/17/2021 in [place] ED to Hosp-Admission (Discharged) from 11/28/2021 in [place] Patient Outreach Telephone from 09/09/2021 in [place] Interventions        Food Insecurity Interventions Intervention Not Indicated Intervention Not Indicated -- Intervention Not Indicated -- --  Housing Interventions Intervention Not Indicated -- -- -- Intervention Not Indicated Intervention Not Indicated  Transportation Interventions Payor Benefit  [Assisted with arranging transportation] -- Other (Comment)  [assisted with arranging transportation to appointments on 01/12/22 and 01/27/22] Other (Comment)  [RNCM assisted with arranging transportation to upcoming appointments] -- --  Utilities Interventions Other (Comment)  [Collaborated with BSW for utility assistance] Intervention Not Indicated -- -- -- --  Alcohol Usage Interventions -- -- -- -- -- Intervention Not Indicated (Score <7)        Patient interviewed and appropriate assessments performed Discussed plans with patient for ongoing care management follow up and provided patient with direct contact information for care management team-   COPD: (Status: Goal on Track (progressing): YES.) Long Term Goal  Reviewed medications with patient, Advised patient to self assesses COPD action plan zone and make appointment with provider if in the yellow zone for 48 hours without improvement Provided education about and advised patient to utilize infection prevention strategies to reduce risk of respiratory infection Discussed the importance of adequate rest and management of fatigue with COPD Advised patient to avoid going out in the cold, cover mouth with a scarf when going out Infection prevention reviewed Encouraged patient to continue to avoid drinking alcohol and avoid smoke RNCM contacted pharmacy to inquire about previously requested medications, patient will have his sister take him to pick up medications today Collaborate with PCP for 90 day prescriptions when appropriate   Patient Goals/Self-Care Activities: Take medications as prescribed   Attend all  scheduled provider appointments Call pharmacy for medication refills 3-7 days in advance of running out of medications Perform all self care activities independently  Call provider office for new concerns or questions  - avoid second hand smoke - limit outdoor activity during cold weather - keep follow-up appointments:  01/27/22 with Pulmonology - eat healthy/prescribed diet: DASH - get at least 7 to 8 hours of sleep at night       Follow Up:  Patient agrees to Care Plan and Follow-up.  Plan: The Managed Medicaid care management team will reach out to the patient again over the next 30 days.  Date/time of next scheduled RN care management/care coordination outreach:  04/19/22 @ [place] RN, BSN [place]  [facility] surveyor

## 2022-03-22 ENCOUNTER — Telehealth: Payer: Self-pay | Admitting: Critical Care Medicine

## 2022-03-22 ENCOUNTER — Other Ambulatory Visit: Payer: Self-pay

## 2022-03-22 MED ORDER — PANTOPRAZOLE SODIUM 40 MG PO TBEC
40.0000 mg | DELAYED_RELEASE_TABLET | Freq: Every day | ORAL | 6 refills | Status: DC
  Filled 2022-03-22 – 2022-05-07 (×3): qty 90, 90d supply, fill #0

## 2022-03-22 MED ORDER — FUROSEMIDE 40 MG PO TABS
40.0000 mg | ORAL_TABLET | Freq: Every day | ORAL | 1 refills | Status: DC
  Filled 2022-03-22 – 2022-04-12 (×2): qty 90, 90d supply, fill #0
  Filled 2022-05-10: qty 90, 90d supply, fill #1

## 2022-03-22 MED ORDER — GLIPIZIDE ER 5 MG PO TB24
5.0000 mg | ORAL_TABLET | Freq: Every day | ORAL | 1 refills | Status: DC
  Filled 2022-03-22 – 2022-05-07 (×2): qty 90, 90d supply, fill #0

## 2022-03-22 MED ORDER — VITAMIN D3 25 MCG PO TABS
1000.0000 [IU] | ORAL_TABLET | Freq: Every day | ORAL | 2 refills | Status: DC
  Filled 2022-03-22: qty 90, 90d supply, fill #0

## 2022-03-22 MED ORDER — PREDNISONE 10 MG PO TABS
10.0000 mg | ORAL_TABLET | Freq: Every day | ORAL | 6 refills | Status: DC
  Filled 2022-03-22: qty 90, 90d supply, fill #0
  Filled 2022-04-12: qty 30, 30d supply, fill #0
  Filled 2022-05-19 (×2): qty 30, 30d supply, fill #1
  Filled 2022-05-19: qty 30, 30d supply, fill #0

## 2022-03-22 NOTE — Telephone Encounter (Signed)
I have sent 90 day Rx on all applicable Rx's  I cannot send a schedule II med for 90 days: his Lyrica  Pls let the patient know James [name]

## 2022-03-22 NOTE — Telephone Encounter (Signed)
-----   Message from [name], RN sent at 03/19/2022 10:21 AM EDT ----- Regarding: medications Hi [name],  Can you change any of Mr. Mucker medications to 90 day prescriptions if appropriate? Thank you for all you do for your patients!  [name] RN, BSN [place]  [employer] manager

## 2022-03-24 ENCOUNTER — Telehealth (HOSPITAL_COMMUNITY): Payer: Self-pay

## 2022-03-24 NOTE — Telephone Encounter (Signed)
I reached out to pt to see if he every got his meds.  He said that his sister had to pick it up and it was at [facility].    [name], [place] 03/24/2022

## 2022-04-01 ENCOUNTER — Encounter: Payer: Self-pay | Admitting: Internal Medicine

## 2022-04-01 ENCOUNTER — Ambulatory Visit: Payer: Medicaid Other | Admitting: Internal Medicine

## 2022-04-01 VITALS — BP 138/62 | HR 78 | Ht 69.0 in | Wt 211.0 lb

## 2022-04-01 DIAGNOSIS — K219 Gastro-esophageal reflux disease without esophagitis: Secondary | ICD-10-CM

## 2022-04-01 DIAGNOSIS — R1310 Dysphagia, unspecified: Secondary | ICD-10-CM | POA: Diagnosis not present

## 2022-04-01 DIAGNOSIS — R079 Chest pain, unspecified: Secondary | ICD-10-CM

## 2022-04-01 DIAGNOSIS — R1319 Other dysphagia: Secondary | ICD-10-CM

## 2022-04-01 NOTE — Patient Instructions (Signed)
_______________________________________________________  If your blood pressure at your visit was 140/90 or greater, please contact your primary care physician to follow up on this.  _______________________________________________________  If you are age 61 or older, your body mass index should be between 23-30. Your Body mass index is 31.16 kg/m. If this is out of the aforementioned range listed, please consider follow up with your Primary Care Provider.  If you are age 35 or younger, your body mass index should be between 19-25. Your Body mass index is 31.16 kg/m. If this is out of the aformentioned range listed, please consider follow up with your Primary Care Provider.   ________________________________________________________  The [facility] providers would like to encourage you to use [facility] to communicate with providers for non-urgent requests or questions.  Due to long hold times on the telephone, sending your provider a message by [facility] may be a faster and more efficient way to get a response.  Please allow 48 business hours for a response.  Please remember that this is for non-urgent requests.  _______________________________________________________  James [name] have been scheduled for a Barium Esophogram at [facility] (1st floor of the hospital) on 04/16/2022 at 9:00am. Please arrive 15 minutes prior to your appointment for registration. Make certain not to have anything to eat or drink 3 hours prior to your test. If you need to reschedule for any reason, please contact radiology at [phone] to do so. __________________________________________________________________ A barium swallow is an examination that concentrates on views of the esophagus. This tends to be a double contrast exam (barium and two liquids which, when combined, create a gas to distend the wall of the oesophagus) or single contrast (non-ionic iodine based). The study is usually tailored to your symptoms so a  good history is essential. Attention is paid during the study to the form, structure and configuration of the esophagus, looking for functional disorders (such as aspiration, dysphagia, achalasia, motility and reflux) EXAMINATION You may be asked to change into a gown, depending on the type of swallow being performed. A radiologist and radiographer will perform the procedure. The radiologist will advise you of the type of contrast selected for your procedure and direct you during the exam. You will be asked to stand, sit or lie in several different positions and to hold a small amount of fluid in your mouth before being asked to swallow while the imaging is performed .In some instances you may be asked to swallow barium coated marshmallows to assess the motility of a solid food bolus. The exam can be recorded as a digital or video fluoroscopy procedure. POST PROCEDURE It will take 1-2 days for the barium to pass through your system. To facilitate this, it is important, unless otherwise directed, to increase your fluids for the next 24-48hrs and to resume your normal diet.  This test typically takes about 30 minutes to perform. __________________________________________________________________________________

## 2022-04-01 NOTE — Progress Notes (Signed)
HISTORY OF PRESENT ILLNESS:  James [name] is a 62 y.o. male with multiple medical problems, including advanced lung disease on chronic oxygen therapy, who was sent today by Dr. Asencion [name] regarding chronic dysphagia.  He is new to this practice.  No GI history noted in the EMR.  Hospitalizations, laboratories, x-rays, and ancillary studies reviewed.  Patient tells me that he has had problems with dysphagia for years.  The issues principally liquids.  Associated with liquid dysphagia, often, his chest pain.  Does not seem to have any significant troubles with solid foods or pills.  Does have a history of reflux for which she is on pantoprazole daily.  He reports compliance with medication.  He denies active reflux symptoms when on medication.  For his chronic dysphagia he has been evaluated previously with barium swallow as well as modified barium swallow with speech pathology, as outlined below.  Barium esophagram with tablet July 06, 2019: FINDINGS: Normal pharyngeal anatomy and motility. Contrast flowed freely through the esophagus without evidence of a stricture or mass. Normal esophageal mucosa without evidence of irregularity or ulceration. Esophageal motility was normal. Severe gastroesophageal reflux to the level of the cervical esophagus. No definite hiatal hernia was demonstrated.   At the end of the examination a 13 mm barium tablet was administered which transited through the esophagus and esophagogastric junction without delay.   IMPRESSION: 1. Severe gastroesophageal reflux to the level of the cervical esophagus. 2. No esophageal stricture.   Patient also had a modified barium swallow with speech pathology April 22, 2020: Clinical Impression             Pt demonstrates no dysphagia or aspiration during assessment. He was very fatigued and short of breath and also consumed liquids at a rapid consecutive rate by straw. He was much more discoordinated with cup sip due to  fear of spilling from tremor. SLP gave large quantities to solids dur to pt report of occasional regurgitation, but these were tolerated well and esophageal sweep with pills also appeared WNL. Will f/u to observe pt with meal given his report, but pt may continue a regular diet and thin liquids with basic precautions.   Swallow Evaluation Recommendations    SLP Diet Recommendations: Regular solids;Thin liquid Liquid Administration via: Cup;Straw Medication Administration: Whole meds with liquid Supervision: Patient able to self feed Compensations: Slow rate Postural Changes: Seated upright at 90 degrees   REVIEW OF SYSTEMS:  All non-GI ROS negative except for shortness of breath  Past Medical History:  Diagnosis Date   Acute on chronic respiratory failure with hypoxia ([place]) 06/08/2013   Acute on chronic respiratory failure with hypoxia and hypercapnia ([place]) 06/08/2013   Alcohol use disorder 11/23/2018   CAP (community acquired pneumonia) 05/17/2018   See admit 05/17/18 ? rml  with   covid pcr neg - rx augmentin > f/u cxr in 4-6 weeks is fine unless condition declines    Community acquired pneumonia 07/12/2021   Community acquired pneumonia of right lower lobe of lung 05/17/2018   See admit 05/17/18 ? 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   Recurrent pneumonia 02/20/2021   Tick bite 06/03/2018    Past Surgical History:  Procedure Laterality Date   LEFT HEART CATH AND CORONARY ANGIOGRAPHY N/A 12/07/2021    Procedure: LEFT HEART CATH AND CORONARY ANGIOGRAPHY;  Surgeon: Lorretta Harp, MD;  Location: [facility];  Service: Cardiovascular;  Laterality: N/A;   LUNG REMOVAL, PARTIAL     VIDEO ASSISTED THORACOSCOPY (VATS)/THOROCOTOMY Left 06/11/2013   Procedure: VIDEO ASSISTED THORACOSCOPY (VATS)/THOROCOTOMY;  Surgeon: Ivin Poot, MD;  Location: [facility];  Service: Thoracic;  Laterality: Left;   VIDEO BRONCHOSCOPY N/A 06/11/2013   Procedure: VIDEO BRONCHOSCOPY;  Surgeon: Ivin Poot, MD;  Location: [facility];  Service: Thoracic;  Laterality: N/A;   VIDEO BRONCHOSCOPY N/A 08/10/2018   Procedure: VIDEO BRONCHOSCOPY WITH FLUORO;  Surgeon: Candee Furbish, MD;  Location[facility];  Service: Endoscopy;  Laterality: N/A;    Social History James [name]  reports that he quit smoking about 10 years ago. His smoking use included cigarettes. He has a 60.00 pack-year smoking history. He has quit using smokeless tobacco.  His smokeless tobacco use included chew. He reports that he does not currently use alcohol after a past usage of about 28.0 standard drinks of alcohol per week. He reports that he does not currently use drugs after having used the following drugs: Marijuana.  family history includes COPD in his sister; Heart disease in his father.  No Known Allergies     PHYSICAL EXAMINATION: Vital signs: BP 138/62   Pulse 78   Ht 5\' 9"  (1.753 m)   Wt 211 lb (95.7 kg)   BMI 31.16 kg/m   Constitutional: Chronically ill-appearing male, no acute distress, no obvious labored breathing, even at rest. Psychiatric: alert and oriented x3, cooperative Eyes: extraocular movements intact, anicteric, conjunctiva pink Mouth: oral pharynx moist, no lesions.  No thrush Neck: supple no lymphadenopathy Cardiovascular: heart regular rate and rhythm, no murmur Lungs: clear to auscultation bilaterally with distant breath sounds Abdomen: soft, nontender, nondistended, no obvious ascites, no peritoneal  signs, normal bowel sounds, no organomegaly Rectal: Omitted Extremities: no clubbing, cyanosis, or lower extremity edema bilaterally Skin: no lesions on visible extremities Neuro: No focal deficits.  Cranial nerves intact  ASSESSMENT:  1.  Longstanding dysphagia, principally liquids, with associated chest pain.  Rule out motility disorder.  Previous evaluation with swallowing study and modified swallowing study as noted above 2.  Advanced lung disease.  Very high risk endoscopy patient 3.  GERD.  No reflux symptoms on PPI  PLAN:  1.  Repeat barium swallow with tablet.  This to be reassured that there have been no interval changes in the luminal esophagus, such as cancer.  We will contact him with results plans. 2.  If this is negative, would proceed with high-resolution manometry to rule out motility disorder.  I explained to him that some motility disorders are readily treatable, while others are not.  He is interested in proceeding 3.  Continue pantoprazole daily 4.  Ongoing general medical care with [name] Total time 60 minutes was spent preparing to see the patient, obtaining comprehensive history, performing medically appropriate physical examination, counseling and educating the patient regarding the above listed issues, ordering radiology study, ordering endoscopic diagnostic study, and documenting clinical information in the health record

## 2022-04-05 ENCOUNTER — Telehealth (HOSPITAL_COMMUNITY): Payer: Self-pay

## 2022-04-05 DIAGNOSIS — F79 Unspecified intellectual disabilities: Secondary | ICD-10-CM | POA: Diagnosis not present

## 2022-04-05 DIAGNOSIS — J9601 Acute respiratory failure with hypoxia: Secondary | ICD-10-CM | POA: Diagnosis not present

## 2022-04-05 DIAGNOSIS — J9621 Acute and chronic respiratory failure with hypoxia: Secondary | ICD-10-CM | POA: Diagnosis not present

## 2022-04-05 NOTE — Telephone Encounter (Signed)
Reached out to pt for a check in this week.  He said he was doing fine--doing the same.  He reports needing to call in losartan for a refill-he said he had some 50mg  tabs of losartan but he has been taking 2 of those to get the 100mg  dose that he is on now.  He said he would call in the losartan for refill.   I will f/u with home visit next week.   [name], [place] 04/05/2022

## 2022-04-07 DIAGNOSIS — F79 Unspecified intellectual disabilities: Secondary | ICD-10-CM | POA: Diagnosis not present

## 2022-04-07 DIAGNOSIS — J9601 Acute respiratory failure with hypoxia: Secondary | ICD-10-CM | POA: Diagnosis not present

## 2022-04-07 DIAGNOSIS — J9621 Acute and chronic respiratory failure with hypoxia: Secondary | ICD-10-CM | POA: Diagnosis not present

## 2022-04-12 ENCOUNTER — Other Ambulatory Visit: Payer: Self-pay

## 2022-04-13 ENCOUNTER — Encounter: Payer: Self-pay | Admitting: Podiatry

## 2022-04-13 ENCOUNTER — Ambulatory Visit: Payer: Medicaid Other | Admitting: Podiatry

## 2022-04-13 DIAGNOSIS — M79675 Pain in left toe(s): Secondary | ICD-10-CM | POA: Diagnosis not present

## 2022-04-13 DIAGNOSIS — E119 Type 2 diabetes mellitus without complications: Secondary | ICD-10-CM | POA: Diagnosis not present

## 2022-04-13 DIAGNOSIS — M79674 Pain in right toe(s): Secondary | ICD-10-CM

## 2022-04-13 DIAGNOSIS — B351 Tinea unguium: Secondary | ICD-10-CM | POA: Diagnosis not present

## 2022-04-13 NOTE — Progress Notes (Signed)
  Subjective:  Patient ID: James [name], male    DOB: 05-Jan-1960,   MRN: [id number]  No chief complaint on file.   62 y.o. male presents for concern of thickened elongated and painful nails that are difficult to trim. Requesting to have them trimmed today. Relates burning and tingling in their feet. Patient is diabetic and last A1c was  Lab Results  Component Value Date   HGBA1C 6.4 (H) 03/02/2022   .   PCP:  [name], MD    . Denies any other pedal complaints. Denies n/v/f/c.   Past Medical History:  Diagnosis Date   Acute on chronic respiratory failure with hypoxia 06/08/2013   Acute on chronic respiratory failure with hypoxia and hypercapnia 06/08/2013   Alcohol use disorder 11/23/2018   CAP (community acquired pneumonia) 05/17/2018   See admit 05/17/18 ? rml  with   covid pcr neg - rx augmentin > f/u cxr in 4-6 weeks is fine unless condition declines    Community acquired pneumonia 07/12/2021   Community acquired pneumonia of right lower lobe of lung 05/17/2018   See admit 05/17/18 ? 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 CFT <3 seconds. Absent hair growth on digits. Edema noted to bilateral lower extremities. Xerosis noted bilaterally.  Skin. No lacerations or abrasions bilateral feet. Nails 1-5 bilateral  are thickened discolored and elongated with subungual debris. Small abrasion to plantar  left foot. No erythema edema or purulence noted.  Musculoskeletal: MMT 5/5 bilateral lower extremities in DF, PF, Inversion and Eversion. Deceased ROM in DF of ankle joint.  Neurological: Sensation intact to light touch. Protective sensation diminished bilateral.    Assessment:   1. Pain due to onychomycosis of toenails of both feet   2. Type 2 diabetes mellitus without complication, without long-term current use of insulin       Plan:  Patient was evaluated and treated and all questions answered. -Discussed and educated patient on diabetic foot care, especially with  regards to the vascular, neurological and musculoskeletal systems.  -Stressed the importance of good glycemic control and the detriment of not  controlling glucose levels in relation to the foot. -Discussed supportive shoes at all times and checking feet regularly.  -Mechanically debrided all nails 1-5 bilateral using sterile nail nipper and filed with dremel without incident  -Curently using OTC fungal medicine that helps will continue.  -Keep bandaid and neosporin on abraised area until healed.  -Answered all patient questions -Patient to return  in 3 months for at risk foot care -Patient advised to call the office if any problems or questions arise in the meantime.   Louann Sjogren, DPM

## 2022-04-14 ENCOUNTER — Telehealth: Payer: Self-pay | Admitting: Pharmacist

## 2022-04-14 ENCOUNTER — Other Ambulatory Visit: Payer: Medicaid Other | Admitting: *Deleted

## 2022-04-14 ENCOUNTER — Other Ambulatory Visit (HOSPITAL_COMMUNITY): Payer: Self-pay

## 2022-04-14 DIAGNOSIS — E119 Type 2 diabetes mellitus without complications: Secondary | ICD-10-CM

## 2022-04-14 MED ORDER — FREESTYLE LIBRE 2 READER DEVI
0 refills | Status: DC
  Filled 2022-04-14: qty 1, fill #0
  Filled 2022-04-15 – 2022-06-15 (×3): qty 1, 30d supply, fill #0

## 2022-04-14 MED ORDER — FREESTYLE LIBRE 2 SENSOR MISC
6 refills | Status: DC
  Filled 2022-04-14: qty 2, fill #0
  Filled 2022-04-15 – 2022-06-15 (×4): qty 2, 28d supply, fill #0

## 2022-04-14 NOTE — Patient Outreach (Signed)
Care Coordination  04/14/2022  James [name] [birth year] [id number]   RNCM received phone call from James [name] needing assistance arranging transportation to upcoming appointment on 04/16/22. RNCM returned call and assisted with arranging transportation to appointments on 04/16/22 and 05/13/22. James [name] has been given all details. RNCM will follow up with patient on 04/19/22 for scheduled telephone outreach.  [name] RN, BSN [place]  Managed [facility] RN Care Coordinator [phone]

## 2022-04-14 NOTE — Progress Notes (Signed)
Paramedicine Encounter    Patient ID: James [name], male    DOB: [birth year], 62 y.o.   MRN: [id number]   Complaints-none  Edema-none  Compliance with meds-yes  Pill box filled-doesnt use  If so, by whom-n/a  Refills needed-p/u meds this week     Pt reports he is doing about the same. He has some meds at pharmacy he will get this week.   He has some red spots on both arms. He thinks this is the same rash type he has had in the past. He denies it itching or painful. He still has some cream that he used last time, he will use that again and see if that helps.  He just noticed it about 2 days ago.   He has swallow test on Friday and he has been set up with txp for that.  Lungs sound good today. No wheezing noted.   He has not been checking his CBG's at home-his shaking and dexterity is quite poor to manipulate the meter/test strips and pen.  Plus he has hard time getting blood from his fingers due to thick skin and callused fingers and then by then the meter turns off b/c it takes him so long and then he gets confused on what to do next.  The dexcom/freestyle would be ideal for him to use and use the meter to check it that way if insurance allows.  Sent jane message to see if she can look into it.     CBG EMS-219 BP 120/60   Pulse 80   Resp (!) 22   SpO2 98%  Weight yesterday-? Last visit weight-211 @ office   Patient Care Team: [name], MD as PCP - General (Pulmonary Disease) O'Neal, Ronnald Ramp, MD as PCP - Cardiology (Cardiology) [name], RN as Case Manager Gustavus Bryant, LCSW as [facility] Care Management (Licensed Clinical Social Worker)  Patient Active Problem List   Diagnosis Date Noted  . Malignant neoplasm of unspecified part of right bronchus or lung 03/02/2022  . Alcohol dependence in remission 03/02/2022  . Aortic root dilation 03/02/2022  . Vision changes 12/29/2021  . Oropharyngeal dysphagia 12/29/2021  . Onychomycosis  12/29/2021  . Malnutrition of moderate degree 12/05/2021  . Tinea cruris 07/21/2021  . Multiple lung nodules on CT 06/11/2021  . COPD with chronic bronchitis 02/01/2021  . GERD (gastroesophageal reflux disease) 02/01/2021  . Neuropathy 09/29/2020  . Bronchiectasis 09/24/2019  . Type 2 diabetes mellitus without complication, without long-term current use of insulin 07/24/2019  . Other atopic dermatitis 05/01/2019  . Chronic respiratory failure with hypoxia 04/23/2019  . Intellectual disability 04/23/2019  . Hypertension 05/17/2016  . Former tobacco use 04/06/2016  . COPD mixed type 03/26/2016    Current Outpatient Medications:  .  albuterol (PROVENTIL) (2.5 MG/3ML) 0.083% nebulizer solution, Take 3 mLs (2.5 mg total) by nebulization every 4 (four) hours as needed for wheezing or shortness of breath., Disp: 270 mL, Rfl: 1 .  albuterol (VENTOLIN HFA) 108 (90 Base) MCG/ACT inhaler, Inhale 2 puffs into the lungs every 6 (six) hours as needed for wheezing or shortness of breath., Disp: 54 g, Rfl: 1 .  atorvastatin (LIPITOR) 10 MG tablet, Take 1 tablet (10 mg total) by mouth daily., Disp: 90 tablet, Rfl: 3 .  Budeson-Glycopyrrol-Formoterol (BREZTRI AEROSPHERE) 160-9-4.8 MCG/ACT AERO, Inhale 2 puffs into the lungs in the morning and at bedtime., Disp: 10.7 g, Rfl: 11 .  Cholecalciferol (VITAMIN D3) 25 MCG (1000  UT) tablet, Take 1 tablet (1,000 Units total) by mouth daily., Disp: 90 tablet, Rfl: 2 .  furosemide (LASIX) 40 MG tablet, Take 1 tablet (40 mg total) by mouth daily., Disp: 90 tablet, Rfl: 1 .  glipiZIDE (GLUCOTROL XL) 5 MG 24 hr tablet, Take 1 tablet (5 mg total) by mouth daily with breakfast., Disp: 90 tablet, Rfl: 1 .  losartan (COZAAR) 100 MG tablet, Take 1 tablet (100 mg total) by mouth daily., Disp: 90 tablet, Rfl: 2 .  metoprolol succinate (TOPROL-XL) 50 MG 24 hr tablet, Take 1 tablet (50 mg total) by mouth daily. Take with or immediately following a meal., Disp: 90 tablet, Rfl: 2 .   nystatin cream (MYCOSTATIN), Apply to affected areas 3 (three) times daily., Disp: 30 g, Rfl: 2 .  pantoprazole (PROTONIX) 40 MG tablet, Take 1 tablet (40 mg total) by mouth daily., Disp: 90 tablet, Rfl: 6 .  predniSONE (DELTASONE) 10 MG tablet, Take 1 tablet (10 mg total) by mouth daily., Disp: 90 tablet, Rfl: 6 .  pregabalin (LYRICA) 100 MG capsule, Take 1 capsule (100 mg total) by mouth 2 (two) times daily., Disp: 60 capsule, Rfl: 2 No Known Allergies    Social History   Socioeconomic History  . Marital status: Divorced    Spouse name: Not on file  . Number of children: Not on file  . Years of education: Not on file  . Highest education level: Not on file  Occupational History  . Occupation: Curator  . Occupation: Painter  Tobacco Use  . Smoking status: Former    Packs/day: 1.50    Years: 40.00    Additional pack years: 0.00    Total pack years: 60.00    Types: Cigarettes    Quit date: 2014    Years since quitting: 10.2  . Smokeless tobacco: Former    Types: Engineer, drilling  . Vaping Use: Never used  Substance and Sexual Activity  . Alcohol use: Not Currently    Alcohol/week: 28.0 standard drinks of alcohol    Types: 28 Cans of beer per week    Comment: 4 beers a night previously  . Drug use: Not Currently    Types: Marijuana    Comment: daily; quit using crack and methamphetamine about 5 months ago   . Sexual activity: Not Currently    Partners: Female  Other Topics Concern  . Not on file  Social History Narrative   Lives alone near [place]   Social Determinants of Health   Financial Resource Strain: Low Risk  (12/25/2020)   Overall Financial Resource Strain (CARDIA)   . Difficulty of Paying Living Expenses: Not very hard  Recent Concern: Financial Resource Strain - High Risk (10/29/2020)   Overall Financial Resource Strain (CARDIA)   . Difficulty of Paying Living Expenses: Hard  Food Insecurity: No Food Insecurity (02/17/2022)   Hunger Vital Sign   .  Worried About Programme researcher, broadcasting/film/video in the Last Year: Never true   . Ran Out of Food in the Last Year: Never true  Transportation Needs: Unmet Transportation Needs (04/14/2022)   PRAPARE - Transportation   . Lack of Transportation (Medical): Yes   . Lack of Transportation (Non-Medical): Yes  Physical Activity: Inactive (12/11/2020)   Exercise Vital Sign   . Days of Exercise per Week: 0 days   . Minutes of Exercise per Session: 0 min  Stress: No Stress Concern Present (12/25/2020)   [name] of Occupational Health - Occupational Stress Questionnaire   .  Feeling of Stress : Not at all  Social Connections: Moderately Integrated (11/13/2020)   Social Connection and Isolation Panel [NHANES]   . Frequency of Communication with Friends and Family: More than three times a week   . Frequency of Social Gatherings with Friends and Family: Once a week   . Attends Religious Services: 1 to 4 times per year   . Active Member of Clubs or Organizations: Yes   . Attends BankerClub or Organization Meetings: 1 to 4 times per year   . Marital Status: Divorced  Catering managerntimate Partner Violence: Not At Risk (12/03/2021)   Humiliation, Afraid, Rape, and Kick questionnaire   . Fear of Current or Ex-Partner: No   . Emotionally Abused: No   . Physically Abused: No   . Sexually Abused: No    Physical Exam      Future Appointments  Date Time Provider Department Center  04/16/2022  9:00 AM [facility] R/F 1 [facility] [place]  04/19/2022  9:00 AM Heidi Dachobb, Melanie A, RN [facility] None  05/13/2022  2:00 PM O'Neal, Ronnald RampWesley Thomas, MD [facility] None  06/09/2022  9:30 AM Storm FriskWright, Patrick E, MD [facility] None  07/13/2022  9:45 AM Louann SjogrenSikora, Rebecca, DPM [facility]       Kerry HoughKatie Paulyne Mooty, Paramedic [phone] [facility]  04/14/22

## 2022-04-14 NOTE — Telephone Encounter (Signed)
Can we attempt a PA for this patient for James [name]? He does not take insulin, however, he struggles with dexterity. Per Erskine Squibb and paramedicine, he struggles with his hands shaking to get blood taken up by the test strips. I sent the rxns over.

## 2022-04-15 ENCOUNTER — Other Ambulatory Visit (HOSPITAL_COMMUNITY): Payer: Self-pay

## 2022-04-15 ENCOUNTER — Other Ambulatory Visit: Payer: Self-pay

## 2022-04-15 ENCOUNTER — Encounter: Payer: Self-pay | Admitting: *Deleted

## 2022-04-16 ENCOUNTER — Other Ambulatory Visit: Payer: Self-pay

## 2022-04-16 ENCOUNTER — Ambulatory Visit (HOSPITAL_COMMUNITY)
Admission: RE | Admit: 2022-04-16 | Discharge: 2022-04-16 | Disposition: A | Discharge status: Home / Self Care | Payer: Medicaid Other | Source: Ambulatory Visit | Attending: Internal Medicine | Admitting: Internal Medicine

## 2022-04-16 DIAGNOSIS — R1310 Dysphagia, unspecified: Secondary | ICD-10-CM | POA: Diagnosis not present

## 2022-04-16 DIAGNOSIS — R1319 Other dysphagia: Secondary | ICD-10-CM | POA: Diagnosis not present

## 2022-04-16 DIAGNOSIS — K219 Gastro-esophageal reflux disease without esophagitis: Secondary | ICD-10-CM

## 2022-04-19 ENCOUNTER — Other Ambulatory Visit: Payer: Medicaid Other | Admitting: *Deleted

## 2022-04-19 ENCOUNTER — Encounter: Payer: Self-pay | Admitting: *Deleted

## 2022-04-19 ENCOUNTER — Other Ambulatory Visit (HOSPITAL_COMMUNITY): Payer: Self-pay

## 2022-04-19 NOTE — Patient Instructions (Signed)
Visit Information  [name] was given information about Medicaid Managed Care team care coordination services as a part of their Healthy [facility] Medicaid benefit. James [name] verbally consented to engagement with the [facility] Managed Care team.   If you are experiencing a medical emergency, please call 911 or report to your local emergency department or urgent care.   If you have a non-emergency medical problem during routine business hours, please contact your provider's office and ask to speak with a nurse.   For questions related to your Healthy [facility] health plan, please call: [phone] or visit the homepage here: [email or web address]  If you would like to schedule transportation through your Healthy [facility] plan, please call the following number at least 2 days in advance of your appointment: [phone]  For information about your ride after you set it up, call Ride Assist at [phone]. Use this number to activate a Will Call pickup, or if your transportation is late for a scheduled pickup. Use this number, too, if you need to make a change or cancel a previously scheduled reservation.  If you need transportation services right away, call [phone]. The after-hours call center is staffed 24 hours to handle ride assistance and urgent reservation requests (including discharges) 365 days a year. Urgent trips include sick visits, hospital discharge requests and life-sustaining treatment.  Call the [facility] Line at [phone], at any time, 24 hours a day, 7 days a week. If you are in danger or need immediate medical attention call 911.  If you would like help to quit smoking, call 1-800-QUIT-NOW (530-626-3655) OR Espaol: 1-855-Djelo-Ya (5-188-416-6063) o para ms informacin haga clic aqu or Text READY to 016-010 to register via text  James [name],   Please see education materials related to COPD provided as print  materials.   The patient verbalized understanding of instructions, educational materials, and care plan provided today and agreed to receive a mailed copy of patient instructions, educational materials, and care plan.   Telephone follow up appointment with Managed Medicaid care management team member scheduled for:05/25/22 @ 9am  James Emms RN, BSN [place]  Managed [facility] RN Care Coordinator [phone]   Following is a copy of your plan of care:  Care Plan : RN Care Manager Plan of Care  Updates made by James Dach, RN since 04/19/2022 12:00 AM     Problem: Chronic Disease Management needs in patient with COPD   Priority: High     Long-Range Goal: Plan of care established for for Chronic Disease management for patient with COPD   Start Date: 11/13/2020  Expected End Date: 06/04/2022  Priority: High  Note:   Current Barriers:  Care Coordination needs related to Limited access to food  Chronic Disease Management support and education needs related to COPD James [name] has all prescribed medications. He will request refills for two medications needing refilled. He denies any needs today.   RNCM Clinical Goal(s):  Patient will verbalize understanding of plan for management of COPD as evidenced by taking all medications as prescribed, attending all scheduled appointments and contacting provider with questions and concerns take all medications exactly as prescribed and will call provider for medication related questions as evidenced by refilling medications and taking as prescribed    attend all scheduled medical appointments: 05/13/22 with Cardiology and 06/09/22 with PCP as evidenced by provider notes in EMR        demonstrate improved adherence to prescribed treatment plan for COPD as evidenced by  taking all medications as prescribed, attending all scheduled appointments and contacting provider with questions and concerns continue to work with RN Care Manager and/or Social  Worker to address care management and care coordination needs related to COPD as evidenced by adherence to CM Team Scheduled appointments     through collaboration with RN Care manager, provider, and care team.   Interventions: Inter-disciplinary care team collaboration (see longitudinal plan of care) Evaluation of current treatment plan related to  self management and patient's adherence to plan as established by provider Reviewed transportation arrangements to upcoming appointment, 05/13/22 pick up time 12:55 pm going to [facility], will call for return ride home # [phone].   SDOH Barriers (Status:  Goal on track:  Yes. Patient interviewed and SDOH assessment performed        SDOH Interventions    Flowsheet Row Patient Outreach Telephone from 04/19/2022 in [place] POPULATION HEALTH DEPARTMENT Patient Outreach Telephone from 04/14/2022 in [place] POPULATION HEALTH DEPARTMENT Patient Outreach Telephone from 03/19/2022 in [place] POPULATION HEALTH DEPARTMENT Patient Outreach Telephone from 02/17/2022 in [place] POPULATION HEALTH DEPARTMENT Patient Outreach Telephone from 01/18/2022 in [place] POPULATION HEALTH DEPARTMENT Telephone from 01/05/2022 in [place] POPULATION HEALTH DEPARTMENT  SDOH Interventions        Food Insecurity Interventions Intervention Not Indicated -- -- Intervention Not Indicated Intervention Not Indicated --  Housing Interventions -- -- -- Intervention Not Indicated -- --  Transportation Interventions -- Payor Benefit  [facility] assisted with arranging transportation with [employer]] Payor Benefit  [assisted with arranging transportation to upcoming appointments] Payor Benefit  [Assisted with arranging transportation] -- Other (Comment)  [assisted with arranging transportation to appointments on 01/12/22 and 01/27/22]  Utilities Interventions Intervention Not Indicated -- -- Other (Comment)  [Collaborated with BSW for utility assistance] Intervention Not Indicated --        Patient interviewed and appropriate assessments performed Discussed plans with patient for ongoing care management follow up and provided patient with direct contact information for care management team-   COPD: (Status: Goal on Track (progressing): YES.) Long Term Goal  Reviewed medications with patient, Advised patient to self assesses COPD action plan zone and make appointment with provider if in the yellow zone for 48 hours without improvement Provided education about and advised patient to utilize infection prevention strategies to reduce risk of respiratory infection Discussed the importance of adequate rest and management of fatigue with COPD Advised patient to avoid going out in the cold, cover mouth with a scarf when going out Infection prevention reviewed Encouraged patient to continue to avoid drinking alcohol and avoid smoke Discussed triggers and avoiding being out in the heat during the hottest part of the day   Patient Goals/Self-Care Activities: Take medications as prescribed   Attend all scheduled provider appointments Call pharmacy for medication refills 3-7 days in advance of running out of medications Perform all self care activities independently  Call provider office for new concerns or questions  - avoid second hand smoke - limit outdoor activity during cold weather - keep follow-up appointments: 05/13/22 with Cardiology - eat healthy/prescribed diet: DASH - get at least 7 to 8 hours of sleep at night

## 2022-04-29 ENCOUNTER — Other Ambulatory Visit: Payer: Self-pay

## 2022-04-29 DIAGNOSIS — R1319 Other dysphagia: Secondary | ICD-10-CM

## 2022-05-03 ENCOUNTER — Encounter (HOSPITAL_COMMUNITY): Payer: Self-pay | Admitting: Emergency Medicine

## 2022-05-03 ENCOUNTER — Telehealth (HOSPITAL_COMMUNITY): Payer: Self-pay

## 2022-05-03 ENCOUNTER — Emergency Department (HOSPITAL_COMMUNITY)
Admission: EM | Admit: 2022-05-03 | Discharge: 2022-05-03 | Disposition: A | Discharge status: Home / Self Care | Payer: Medicaid Other | Attending: Emergency Medicine | Admitting: Emergency Medicine

## 2022-05-03 ENCOUNTER — Encounter: Payer: Self-pay | Admitting: Pharmacist

## 2022-05-03 ENCOUNTER — Other Ambulatory Visit: Payer: Self-pay

## 2022-05-03 ENCOUNTER — Other Ambulatory Visit (HOSPITAL_COMMUNITY): Payer: Self-pay

## 2022-05-03 ENCOUNTER — Emergency Department (HOSPITAL_COMMUNITY): Payer: Medicaid Other

## 2022-05-03 DIAGNOSIS — I10 Essential (primary) hypertension: Secondary | ICD-10-CM | POA: Diagnosis not present

## 2022-05-03 DIAGNOSIS — Z1152 Encounter for screening for COVID-19: Secondary | ICD-10-CM | POA: Diagnosis not present

## 2022-05-03 DIAGNOSIS — E119 Type 2 diabetes mellitus without complications: Secondary | ICD-10-CM | POA: Diagnosis not present

## 2022-05-03 DIAGNOSIS — R0602 Shortness of breath: Secondary | ICD-10-CM | POA: Diagnosis present

## 2022-05-03 DIAGNOSIS — J441 Chronic obstructive pulmonary disease with (acute) exacerbation: Secondary | ICD-10-CM

## 2022-05-03 DIAGNOSIS — R000 Tachycardia, unspecified: Secondary | ICD-10-CM | POA: Diagnosis not present

## 2022-05-03 DIAGNOSIS — R918 Other nonspecific abnormal finding of lung field: Secondary | ICD-10-CM | POA: Diagnosis not present

## 2022-05-03 LAB — CBC WITH DIFFERENTIAL/PLATELET
Abs Immature Granulocytes: 0.05 10*3/uL (ref 0.00–0.07)
Basophils Absolute: 0 10*3/uL (ref 0.0–0.1)
Basophils Relative: 0 %
Eosinophils Absolute: 0.1 10*3/uL (ref 0.0–0.5)
Eosinophils Relative: 0 %
HCT: 37.4 % — ABNORMAL LOW (ref 39.0–52.0)
Hemoglobin: 12.1 g/dL — ABNORMAL LOW (ref 13.0–17.0)
Immature Granulocytes: 0 %
Lymphocytes Relative: 4 %
Lymphs Abs: 0.7 10*3/uL (ref 0.7–4.0)
MCH: 29.5 pg (ref 26.0–34.0)
MCHC: 32.4 g/dL (ref 30.0–36.0)
MCV: 91.2 fL (ref 80.0–100.0)
Monocytes Absolute: 0.7 10*3/uL (ref 0.1–1.0)
Monocytes Relative: 4 %
Neutro Abs: 13.6 10*3/uL — ABNORMAL HIGH (ref 1.7–7.7)
Neutrophils Relative %: 92 %
Platelets: 271 10*3/uL (ref 150–400)
RBC: 4.1 MIL/uL — ABNORMAL LOW (ref 4.22–5.81)
RDW: 14.1 % (ref 11.5–15.5)
WBC: 15 10*3/uL — ABNORMAL HIGH (ref 4.0–10.5)
nRBC: 0 % (ref 0.0–0.2)

## 2022-05-03 LAB — BASIC METABOLIC PANEL
Anion gap: 13 (ref 5–15)
BUN: 20 mg/dL (ref 6–20)
CO2: 28 mmol/L (ref 22–32)
Calcium: 9.2 mg/dL (ref 8.9–10.3)
Chloride: 99 mmol/L (ref 98–111)
Creatinine, Ser: 1.09 mg/dL (ref 0.61–1.24)
GFR, Estimated: >60 mL/min (ref >60)
Glucose, Bld: 110 mg/dL — ABNORMAL HIGH (ref 70–99)
Potassium: 4.5 mmol/L (ref 3.5–5.1)
Sodium: 140 mmol/L (ref 135–145)

## 2022-05-03 LAB — RESP PANEL BY RT-PCR (RSV, FLU A&B, COVID)  RVPGX2
Influenza A by PCR: NEGATIVE
Influenza B by PCR: NEGATIVE
Resp Syncytial Virus by PCR: NEGATIVE
SARS Coronavirus 2 by RT PCR: NEGATIVE

## 2022-05-03 LAB — TROPONIN I (HIGH SENSITIVITY): Troponin I (High Sensitivity): 13 ng/L (ref <18)

## 2022-05-03 LAB — BRAIN NATRIURETIC PEPTIDE: B Natriuretic Peptide: 24.8 pg/mL (ref 0.0–100.0)

## 2022-05-03 MED ORDER — MAGNESIUM SULFATE 2 GM/50ML IV SOLN
2.0000 g | Freq: Once | INTRAVENOUS | Status: AC
  Administered 2022-05-03: 2 g via INTRAVENOUS
  Filled 2022-05-03: qty 50

## 2022-05-03 MED ORDER — PREDNISONE 10 MG PO TABS
40.0000 mg | ORAL_TABLET | Freq: Every day | ORAL | 0 refills | Status: AC
  Filled 2022-05-03 (×2): qty 20, 5d supply, fill #0

## 2022-05-03 MED ORDER — IPRATROPIUM-ALBUTEROL 0.5-2.5 (3) MG/3ML IN SOLN
3.0000 mL | RESPIRATORY_TRACT | Status: AC
  Administered 2022-05-03 (×3): 3 mL via RESPIRATORY_TRACT
  Filled 2022-05-03 (×3): qty 3

## 2022-05-03 MED ORDER — AMOXICILLIN-POT CLAVULANATE 875-125 MG PO TABS
1.0000 | ORAL_TABLET | Freq: Two times a day (BID) | ORAL | 0 refills | Status: DC
  Filled 2022-05-03 (×2): qty 14, 7d supply, fill #0

## 2022-05-03 NOTE — ED Provider Notes (Addendum)
[place] EMERGENCY DEPARTMENT AT [facility] Provider Note   CSN: [id number] Arrival date & time: 05/03/22  4696     History {Add pertinent medical, surgical, social history, OB history to HPI:1} Chief Complaint  Patient presents with   Shortness of Breath    James [name] is a 62 y.o. male with COPD/asthma, h/o NSTEMI, HTN, chronic resp failure on 2L [place], oropharyngeal dysphagia, GERD, T2DM, who p/w SOB.   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  He states he has been coughing more recently and coughing up yellow sputum.  Denies any hemoptysis or chest pain, nausea vomiting, fever/chills, leg swelling, orthopnea.  He states this feels like his prior COPD exacerbations.  By EMS he was given 10mg  albuterol, 0.5 atrovent and 125 mg solumedrol. Per chart review in December 2023 patient was admitted and intubated for severe COPD exacerbation secondary to rhinovirus infection.   Shortness of Breath      Home Medications Prior to Admission medications   Medication Sig Start Date End Date Taking? Authorizing Provider  albuterol (PROVENTIL) (2.5 MG/3ML) 0.083% nebulizer solution Take 3 mLs (2.5 mg total) by nebulization every 4 (four) hours as needed for wheezing or shortness of breath. 03/02/22   [name], MD  albuterol (VENTOLIN HFA) 108 (90 Base) MCG/ACT inhaler Inhale 2 puffs into the lungs every 6 (six) hours as needed for wheezing or shortness of breath. 03/02/22   [name], MD  atorvastatin (LIPITOR) 10 MG tablet Take 1 tablet (10 mg total) by mouth daily. 03/02/22   [name], MD  Budeson-Glycopyrrol-Formoterol (BREZTRI AEROSPHERE) 160-9-4.8 MCG/ACT AERO Inhale 2 puffs into the lungs in the morning and at bedtime. 10/14/21   Omar Person, MD  Cholecalciferol (VITAMIN D3) 25 MCG (1000 UT) tablet Take 1 tablet (1,000 Units total) by mouth  daily. Patient not taking: Reported on 04/19/2022 03/22/22   [name], MD  Continuous Blood Gluc Receiver (FREESTYLE LIBRE 2 READER) DEVI Use to check blood sugar continuously throughout the day. Patient not taking: Reported on 04/19/2022 04/14/22   [name], MD  Continuous Blood Gluc Sensor (FREESTYLE LIBRE 2 SENSOR) MISC Use to check blood sugar continuously throughout the day. Patient not taking: Reported on 04/19/2022 04/14/22   [name], MD  furosemide (LASIX) 40 MG tablet Take 1 tablet (40 mg total) by mouth daily. 03/22/22   [name], MD  glipiZIDE (GLUCOTROL XL) 5 MG 24 hr tablet Take 1 tablet (5 mg total) by mouth daily with breakfast. 03/22/22   [name], MD  losartan (COZAAR) 100 MG tablet Take 1 tablet (100 mg total) by mouth daily. 03/02/22   [name], MD  metoprolol succinate (TOPROL-XL) 50 MG 24 hr tablet Take 1 tablet (50 mg total) by mouth daily. Take with or immediately following a meal. 03/02/22   [name], MD  nystatin cream (MYCOSTATIN) Apply to affected areas 3 (three) times daily. Patient not taking: Reported on 04/19/2022 12/29/21   [name], MD  pantoprazole (PROTONIX) 40 MG tablet Take 1 tablet (40 mg total) by mouth daily. 03/22/22   [name], MD  predniSONE (DELTASONE) 10 MG tablet Take 1 tablet (10 mg total) by mouth daily. 03/22/22   [name], MD  pregabalin (LYRICA) 100 MG capsule Take 1 capsule (100 mg total) by mouth 2 (  two) times daily. 03/02/22   [name], MD      Allergies    Patient has no known allergies.    Review of Systems   Review of Systems  Respiratory:  Positive for shortness of breath.    Review of systems Negative for f/c, CP.  A 10 point review of systems was performed and is negative unless otherwise reported in HPI.  Physical Exam Updated Vital Signs BP 98/74 (BP Location: Right Arm)   Pulse 93   Temp 98.7 F (37.1 C) (Oral)   Resp (!) 22   Ht 5\' 9"   (1.753 m)   Wt 95.7 kg   SpO2 100%   BMI 31.16 kg/m  Physical Exam General: Chronically ill-appearing male, lying in bed.  HEENT: PERRLA, Sclera anicteric, MMM, trachea midline.  Cardiology: RRR, no murmurs/rubs/gallops. BL radial and DP pulses equal bilaterally.  Resp: Poor air movement symmetrically bilaterally with mildly increased WOB, auto-PEEPing w/ prolonged expiratory phase, minimal expiratory wheezing bilaterally Abd: Soft, non-tender, non-distended. No rebound tenderness or guarding.  GU: Deferred. MSK: No peripheral edema or signs of trauma. Extremities without deformity or TTP. No cyanosis or clubbing. Skin: warm, dry.  Neuro: A&Ox4, CNs II-XII grossly intact. MAEs. Sensation grossly intact.  Psych: Normal mood and affect.   ED Results / Procedures / Treatments   Labs (all labs ordered are listed, but only abnormal results are displayed) Labs Reviewed  CBC WITH DIFFERENTIAL/PLATELET  BASIC METABOLIC PANEL  BRAIN NATRIURETIC PEPTIDE    EKG None  Radiology No results found.  Procedures Procedures  {Document cardiac monitor, telemetry assessment procedure when appropriate:1}  Medications Ordered in ED Medications  ipratropium-albuterol (DUONEB) 0.5-2.5 (3) MG/3ML nebulizer solution 3 mL (has no administration in time range)  magnesium sulfate IVPB 2 g 50 mL (has no administration in time range)    ED Course/ Medical Decision Making/ A&P                          Medical Decision Making Amount and/or Complexity of Data Reviewed Labs: ordered. Radiology: ordered.  Risk Prescription drug management.    This patient presents to the ED for concern of SOB, this involves an extensive number of treatment options, and is a complaint that carries with it a high risk of complications and morbidity.  I considered the following differential and admission for this acute, potentially life threatening condition.   MDM:    DDX for dyspnea includes but is not limited  to:  Patient states this feels like his prior episodes of COPD exacerbation.  He has poor air movement bilaterally with minimal expiratory wheezing bilaterally.  He was already given albuterol and Atrovent as well as Solu-Medrol IV by EMS.  Will supplement with additional DuoNebs as well as magnesium IV given his mixed type obstructive lung disease.  He does have a recent history of pneumonia as well as COPD exacerbation in 1223 requiring intubation due to rhinovirus.  He states he has been coughing more recently with yellow sputum though he denies any fevers, will obtain a chest x-ray to evaluate for pneumonia and viral panel. Also consider CHF/pulm edema or pleural effusion. EKG w/ ST w/ intermittent supraventricular bigeminy which has been present in the past, though his lateral T waves are upright whereas most recently they were inverted. He has no leg swelling or CP to indicate PE and I do not believe it is highest on the differential at this time.  Labs: I Ordered, and personally interpreted labs.  The pertinent results include: Those listed above  Imaging Studies ordered: I ordered imaging studies including chest x-ray I independently visualized and interpreted imaging. I agree with the radiologist interpretation  Additional history obtained from chart review.   Cardiac Monitoring: The patient was maintained on a cardiac monitor.  I personally viewed and interpreted the cardiac monitored which showed an underlying rhythm of: Sinus tachycardia  Reevaluation: After the interventions noted above, I reevaluated the patient and found that they have :{resolved/improved/worsened:23923::"improved"}  Social Determinants of Health: Patient lives independently   Disposition:  ***  Co morbidities that complicate the patient evaluation  Past Medical History:  Diagnosis Date   Acute on chronic respiratory failure with hypoxia (HCC) 06/08/2013   Acute on chronic respiratory failure  with hypoxia and hypercapnia (HCC) 06/08/2013   Alcohol use disorder 11/23/2018   CAP (community acquired pneumonia) 05/17/2018   See admit 05/17/18 ? rml  with   covid pcr neg - rx augmentin > f/u cxr in 4-6 weeks is fine unless condition declines    Community acquired pneumonia 07/12/2021   Community acquired pneumonia of right lower lobe of lung 05/17/2018   See admit 05/17/18 ? 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   Recurrent pneumonia 02/20/2021   Tick bite 06/03/2018     Medicines Meds ordered this encounter  Medications   ipratropium-albuterol (DUONEB) 0.5-2.5 (3) MG/3ML nebulizer solution 3 mL   magnesium sulfate IVPB 2 g 50 mL    I have reviewed the patients home medicines and have made adjustments as needed  Problem List / ED Course: Problem List Items Addressed This Visit   None        {Document critical care time when appropriate:1} {Document review of labs and clinical decision tools ie heart score, Chads2Vasc2 etc:1}  {Document your independent review of radiology images, and any outside records:1} {Document your discussion with family members, caretakers, and with consultants:1} {Document social determinants of health affecting pt's care:1} {Document your decision making why or why not admission, treatments were needed:1}  This note was created using dictation software, which may contain spelling or grammatical errors.

## 2022-05-03 NOTE — ED Triage Notes (Signed)
Per [facility] pt coming from home woke up with shortness of breath. Hx of copd and asthma. Given 10mg  albuterol and 0.5 atrovent and 125 mg solumedrol. 20G LAC. On 2L [place] at baseline.

## 2022-05-03 NOTE — Telephone Encounter (Signed)
I called pt to f/u on his ER visit. He said he feels a small bit better.  I seen a note that [facility] sent him mychart message about his meds but he does not have access to that so I called them and also asked for them to refill his pantoprazole and metoprolol to be mailed out.    The 2 meds from ER was sent out today already. He should get those tomor.   They had called about his freestyle meter--insurance doesn't want to pay so they are sending it for PA is what pharmacy told me today.  Erskine Squibb had sent message prior to this about the PA got denied and they are working on an appeal for him.   Will continue to f/u.  Will see him on Thursday to see how he is feeling.     Kerry Hough, EMT-Paramedic  [phone] 05/03/2022

## 2022-05-03 NOTE — ED Notes (Signed)
DC instructions reviewed with pt. Pt verbalized understanding.  Pt DC 

## 2022-05-03 NOTE — ED Notes (Signed)
Dc instructions reviewed with pt. Pt verbalized understanding. Pt Dc ?

## 2022-05-04 ENCOUNTER — Telehealth: Payer: Self-pay | Admitting: *Deleted

## 2022-05-04 ENCOUNTER — Other Ambulatory Visit: Payer: Self-pay

## 2022-05-04 NOTE — Transitions of Care (Post Inpatient/ED Visit) (Signed)
   05/04/2022  Name: James [name] MRN: [id number] DOB: Oct 28, 1960  Today's TOC FU Call Status: Today's TOC FU Call Status:: Successful TOC FU Call Competed TOC FU Call Complete Date: 05/04/22  Transition Care Management Follow-up Telephone Call Date of Discharge: 05/03/22 Discharge Facility: [facility]) Type of Discharge: Emergency Department Reason for ED Visit: Respiratory Respiratory Diagnosis: COPD Exacerbation How have you been since you were released from the hospital?: Better Any questions or concerns?: No  Items Reviewed: Did you receive and understand the discharge instructions provided?: Yes Medications obtained and verified?: Yes (Medications Reviewed) Any new allergies since your discharge?: No Dietary orders reviewed?: NA Do you have support at home?: No  Home Care and Equipment/Supplies: Were Home Health Services Ordered?: NA Any new equipment or medical supplies ordered?: NA  Functional Questionnaire: Do you need assistance with bathing/showering or dressing?: No Do you need assistance with meal preparation?: No Do you need assistance with eating?: No Do you have difficulty maintaining continence: No Do you need assistance with getting out of bed/getting out of a chair/moving?: No Do you have difficulty managing or taking your medications?: No  Follow up appointments reviewed: PCP Follow-up appointment confirmed?: No (Patient to call for appointment with Dr. Delford Field) MD Provider Line Number:[phone] Given: No Specialist Hospital Follow-up appointment confirmed?: NA Do you need transportation to your follow-up appointment?: Yes Transportation Need Intervention Addressed By:: Other: (RNCM will help arrange transportation once appointment is scheduled) Do you understand care options if your condition(s) worsen?: Yes-patient verbalized understanding    [name] RN, BSN [place]  Managed [facility] RN Care Coordinator [phone]

## 2022-05-05 ENCOUNTER — Other Ambulatory Visit: Payer: Self-pay

## 2022-05-05 DIAGNOSIS — J9621 Acute and chronic respiratory failure with hypoxia: Secondary | ICD-10-CM | POA: Diagnosis not present

## 2022-05-05 DIAGNOSIS — F79 Unspecified intellectual disabilities: Secondary | ICD-10-CM | POA: Diagnosis not present

## 2022-05-05 DIAGNOSIS — J9601 Acute respiratory failure with hypoxia: Secondary | ICD-10-CM | POA: Diagnosis not present

## 2022-05-06 ENCOUNTER — Telehealth: Payer: Self-pay | Admitting: *Deleted

## 2022-05-06 ENCOUNTER — Other Ambulatory Visit (HOSPITAL_COMMUNITY): Payer: Self-pay

## 2022-05-06 ENCOUNTER — Other Ambulatory Visit: Payer: Medicaid Other | Admitting: *Deleted

## 2022-05-06 ENCOUNTER — Telehealth (HOSPITAL_COMMUNITY): Payer: Self-pay

## 2022-05-06 NOTE — Patient Outreach (Signed)
  Care Management   Note  05/06/2022 Name: [name] MRN: [id number] DOB: 06/17/1960  [name] is enrolled in a Managed Medicaid plan: Yes. Outreach attempt today was successful.   RNCM assisted with arranging transportation to PCP add on appointment on 05/10/22. Details of appointment and transportation shared with [name]. [name] voiced understanding.  [name] RN, BSN [place]  Managed [facility] RN Care Coordinator [phone]

## 2022-05-06 NOTE — Telephone Encounter (Signed)
I reached out to pt for phone visit today.   He reports feeling better since he was seen in ER.  He is taking all meds. He has not received the metoprolol or pantoprazole yet-I called pharmacy to see what is going on with that-the pharm said they needed new card on file for payment or he could call in to request to have it mailed and it be billed to him.  I also let pharm aware that he does not have access to his mychart so messages on there are not ever seen.   I relayed this info to him-he is not sure what card is on file.  I told him to call if he wants it delivered and billed to him or if he wants to go pick up and pay in person.  He said he has appoint Monday and will p/u then.  He reports having enough meds to last until then.  Will see him next week.   Kerry Hough, EMT-Paramedic  [phone] 05/06/2022

## 2022-05-06 NOTE — Patient Outreach (Signed)
Care Coordination  05/06/2022  FIN HUPP [birth year] [id number]   RNCM returning call to patient. Mr. Strauch was calling to make RNCM aware of PCP appointment on 06/09/22 and requesting assistance with scheduling transportation. RNCM has a scheduled follow up with Mr. Kucinski on 05/25/22 and explained that I will assist with arranging transportation during that scheduled appointment. Mr. Brager denies any other needs at this time.  [name] RN, BSN [place]  Managed [facility] RN Care Coordinator [phone]

## 2022-05-07 ENCOUNTER — Other Ambulatory Visit: Payer: Self-pay

## 2022-05-07 DIAGNOSIS — J9601 Acute respiratory failure with hypoxia: Secondary | ICD-10-CM | POA: Diagnosis not present

## 2022-05-07 DIAGNOSIS — J9621 Acute and chronic respiratory failure with hypoxia: Secondary | ICD-10-CM | POA: Diagnosis not present

## 2022-05-07 DIAGNOSIS — F79 Unspecified intellectual disabilities: Secondary | ICD-10-CM | POA: Diagnosis not present

## 2022-05-10 ENCOUNTER — Other Ambulatory Visit (HOSPITAL_COMMUNITY): Payer: Self-pay

## 2022-05-10 ENCOUNTER — Encounter: Payer: Self-pay | Admitting: Family Medicine

## 2022-05-10 ENCOUNTER — Ambulatory Visit: Payer: Medicaid Other | Attending: Family Medicine | Admitting: Family Medicine

## 2022-05-10 ENCOUNTER — Other Ambulatory Visit: Payer: Self-pay

## 2022-05-10 VITALS — BP 131/75 | HR 92 | Ht 69.0 in | Wt 207.4 lb

## 2022-05-10 DIAGNOSIS — J4489 Other specified chronic obstructive pulmonary disease: Secondary | ICD-10-CM

## 2022-05-10 DIAGNOSIS — Z7984 Long term (current) use of oral hypoglycemic drugs: Secondary | ICD-10-CM

## 2022-05-10 DIAGNOSIS — I10 Essential (primary) hypertension: Secondary | ICD-10-CM | POA: Diagnosis not present

## 2022-05-10 DIAGNOSIS — J9611 Chronic respiratory failure with hypoxia: Secondary | ICD-10-CM

## 2022-05-10 DIAGNOSIS — E119 Type 2 diabetes mellitus without complications: Secondary | ICD-10-CM

## 2022-05-10 NOTE — Progress Notes (Signed)
Pain in feet and legs. 

## 2022-05-10 NOTE — Patient Instructions (Signed)

## 2022-05-10 NOTE — Progress Notes (Signed)
Subjective:  Patient ID: James [name], male    DOB: [birth year]  Age: 62 y.o. MRN: [id number]  CC: Hospitalization Follow-up   HPI James [name] is a 62 y.o. year old male patient of Dr. Delford Field with a history of hypertension, COPD, chronic respiratory failure (on 2L of oxygen), lung cancer (diagnosed 10 years ago)  Interval History:  He was seen at the ED for COPD exacerbation 1 week ago.   CXR revealed: IMPRESSION: Bilateral lower lung patchy opacities, which may reflect atelectasis, aspiration, or pneumonia.  Treated with Solu-Medrol, albuterol, Atrovent, Augmentin. He has completed his course of Augmentin but takes a daily dose of Prednisone. States he is back to baseline and remains adherent with his Breztri and albuterol MDI.  He is adherent with his antihypertensive and is doing well on glipizide.  Also doing well on his statin and his antihypertensive. He denies presence of additional concerns today. Past Medical History:  Diagnosis Date   Acute on chronic respiratory failure with hypoxia (HCC) 06/08/2013   Acute on chronic respiratory failure with hypoxia and hypercapnia (HCC) 06/08/2013   Alcohol use disorder 11/23/2018   CAP (community acquired pneumonia) 05/17/2018   See admit 05/17/18 ? rml  with   covid pcr neg - rx augmentin > f/u cxr in 4-6 weeks is fine unless condition declines    Community acquired pneumonia 07/12/2021   Community acquired pneumonia of right lower lobe of lung 05/17/2018   See admit 05/17/18 ? 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   Recurrent pneumonia 02/20/2021   Tick bite 06/03/2018    Past Surgical History:  Procedure Laterality Date   LEFT HEART CATH AND CORONARY ANGIOGRAPHY N/A 12/07/2021   Procedure: LEFT HEART CATH AND CORONARY ANGIOGRAPHY;  Surgeon: Runell Gess, MD;  Location: [facility];  Service: Cardiovascular;  Laterality: N/A;   LUNG REMOVAL, PARTIAL     VIDEO ASSISTED THORACOSCOPY (VATS)/THOROCOTOMY Left 06/11/2013   Procedure: VIDEO ASSISTED THORACOSCOPY (VATS)/THOROCOTOMY;  Surgeon: Kerin Perna, MD;  Location: [facility] OR;  Service: Thoracic;  Laterality: Left;   VIDEO BRONCHOSCOPY N/A 06/11/2013   Procedure: VIDEO BRONCHOSCOPY;  Surgeon: Kerin Perna, MD;  Location: [facility] OR;  Service: Thoracic;  Laterality: N/A;   VIDEO BRONCHOSCOPY N/A 08/10/2018   Procedure: VIDEO BRONCHOSCOPY WITH FLUORO;  Surgeon: Lorin Glass, MD;  Location: [facility] ENDOSCOPY;  Service: Endoscopy;  Laterality: N/A;    Family History  Problem Relation Age of Onset   Heart disease Father    COPD Sister     Social History   Socioeconomic History   Marital status: Divorced    Spouse name: Not on file   Number of children: Not on file   Years of education: Not on file   Highest education level: Not on file  Occupational History   Occupation: Curator   Occupation: Painter  Tobacco Use   Smoking status: Former    Packs/day: 1.50    Years: 40.00  Additional pack years: 0.00    Total pack years: 60.00    Types: Cigarettes    Quit date: 2014    Years since quitting: 10.3   Smokeless tobacco: Former    Types: Associate Professor Use: Never used  Substance and Sexual Activity   Alcohol use: Not Currently    Alcohol/week: 28.0 standard drinks of alcohol    Types: 28 Cans of beer per week    Comment: 4 beers a night previously   Drug use: Not Currently    Types: Marijuana    Comment: daily; quit using crack and methamphetamine about 5  months ago    Sexual activity: Not Currently    Partners: Female  Other Topics Concern   Not on file  Social History Narrative   Lives alone near [place]   Social Determinants of Health   Financial Resource Strain: Low Risk  (12/25/2020)   Overall Financial Resource Strain (CARDIA)    Difficulty of Paying Living Expenses: Not very hard  Recent Concern: Financial Resource Strain - High Risk (10/29/2020)   Overall Financial Resource Strain (CARDIA)    Difficulty of Paying Living Expenses: Hard  Food Insecurity: No Food Insecurity (04/19/2022)   Hunger Vital Sign    Worried About Running Out of Food in the Last Year: Never true    Ran Out of Food in the Last Year: Never true  Transportation Needs: Unmet Transportation Needs (04/14/2022)   PRAPARE - Transportation    Lack of Transportation (Medical): Yes    Lack of Transportation (Non-Medical): Yes  Physical Activity: Inactive (12/11/2020)   Exercise Vital Sign    Days of Exercise per Week: 0 days    Minutes of Exercise per Session: 0 min  Stress: No Stress Concern Present (12/25/2020)   [name] of Occupational Health - Occupational Stress Questionnaire    Feeling of Stress : Not at all  Social Connections: Moderately Integrated (11/13/2020)   Social Connection and Isolation Panel [NHANES]    Frequency of Communication with Friends and Family: More than three times a week    Frequency of Social Gatherings with Friends and Family: Once a week    Attends Religious Services: 1 to 4 times per year    Active Member of [employer] or Organizations: Yes    Attends Banker Meetings: 1 to 4 times per year    Marital Status: Divorced    No Known Allergies  Outpatient Medications Prior to Visit  Medication Sig Dispense Refill   albuterol (PROVENTIL) (2.5 MG/3ML) 0.083% nebulizer solution Take 3 mLs (2.5 mg total) by nebulization every 4 (four) hours as needed for wheezing or shortness of breath. 270 mL 1   albuterol  (VENTOLIN HFA) 108 (90 Base) MCG/ACT inhaler Inhale 2 puffs into the lungs every 6 (six) hours as needed for wheezing or shortness of breath. 54 g 1   amoxicillin-clavulanate (AUGMENTIN) 875-125 MG tablet Take 1 tablet by mouth every 12 (twelve) hours. 14 tablet 0   atorvastatin (LIPITOR) 10 MG tablet Take 1 tablet (10 mg total) by mouth daily. 90 tablet 3   Budeson-Glycopyrrol-Formoterol (BREZTRI AEROSPHERE) 160-9-4.8 MCG/ACT AERO Inhale 2 puffs into the lungs in the morning and at bedtime. 10.7 g 11   Cholecalciferol (VITAMIN D3) 25 MCG (1000 UT) tablet Take 1 tablet (1,000 Units total) by mouth daily. 90 tablet 2   Continuous Blood Gluc Receiver (FREESTYLE LIBRE 2 READER) DEVI Use to check blood sugar continuously throughout the day. 1  each 0   Continuous Blood Gluc Sensor (FREESTYLE LIBRE 2 SENSOR) MISC Use to check blood sugar continuously throughout the day. 2 each 6   furosemide (LASIX) 40 MG tablet Take 1 tablet (40 mg total) by mouth daily. 90 tablet 1   glipiZIDE (GLUCOTROL XL) 5 MG 24 hr tablet Take 1 tablet (5 mg total) by mouth daily with breakfast. 90 tablet 1   losartan (COZAAR) 100 MG tablet Take 1 tablet (100 mg total) by mouth daily. 90 tablet 2   metoprolol succinate (TOPROL-XL) 50 MG 24 hr tablet Take 1 tablet (50 mg total) by mouth daily. Take with or immediately following a meal. 90 tablet 2   nystatin cream (MYCOSTATIN) Apply to affected areas 3 (three) times daily. 30 g 2   pantoprazole (PROTONIX) 40 MG tablet Take 1 tablet (40 mg total) by mouth daily. 90 tablet 6   predniSONE (DELTASONE) 10 MG tablet Take 1 tablet (10 mg total) by mouth daily. 90 tablet 6   pregabalin (LYRICA) 100 MG capsule Take 1 capsule (100 mg total) by mouth 2 (two) times daily. 60 capsule 2   No facility-administered medications prior to visit.     ROS Review of Systems  Constitutional:  Negative for activity change and appetite change.  HENT:  Negative for sinus pressure and sore throat.    Respiratory:  Negative for chest tightness, shortness of breath and wheezing.   Cardiovascular:  Negative for chest pain and palpitations.  Gastrointestinal:  Negative for abdominal distention, abdominal pain and constipation.  Genitourinary: Negative.   Musculoskeletal: Negative.   Psychiatric/Behavioral:  Negative for behavioral problems and dysphoric mood.     Objective:  BP 131/75   Pulse 92   Ht 5\' 9"  (1.753 m)   Wt 207 lb 6.4 oz (94.1 kg)   SpO2 99%   BMI 30.63 kg/m      05/10/2022   10:00 AM 05/03/2022    1:30 PM 05/03/2022    1:00 PM  BP/Weight  Systolic BP 131 120 123  Diastolic BP 75 80 73  Wt. (Lbs) 207.4    BMI 30.63 kg/m2        Physical Exam Constitutional:      Appearance: He is well-developed.  HENT:     Nose:     Comments: 2 L oxygen via nasal cannula Cardiovascular:     Rate and Rhythm: Normal rate.     Heart sounds: Normal heart sounds. No murmur heard. Pulmonary:     Effort: Pulmonary effort is normal.     Breath sounds: Wheezing present. No rales.  Chest:     Chest wall: No tenderness.  Abdominal:     General: Bowel sounds are normal. There is no distension.     Palpations: Abdomen is soft. There is no mass.     Tenderness: There is no abdominal tenderness.  Musculoskeletal:        General: Normal range of motion.     Right lower leg: No edema.     Left lower leg: No edema.  Neurological:     Mental Status: He is alert and oriented to person, place, and time.  Psychiatric:        Mood and Affect: Mood normal.        Latest Ref Rng & Units 05/03/2022    8:45 AM 03/02/2022    9:12 AM 12/31/2021   10:02 AM  CMP  Glucose 70 - 99 mg/dL 595  638  756   BUN 6 -  20 mg/dL 20  18  8    Creatinine 0.61 - 1.24 mg/dL 1.61  0.96  0.45   Sodium 135 - 145 mmol/L 140  147  143   Potassium 3.5 - 5.1 mmol/L 4.5  4.5  3.0   Chloride 98 - 111 mmol/L 99  105  109   CO2 22 - 32 mmol/L 28  25  26    Calcium 8.9 - 10.3 mg/dL 9.2  9.3  7.5     Lipid  Panel     Component Value Date/Time   CHOL 158 03/02/2022 0912   TRIG 140 03/02/2022 0912   HDL 52 03/02/2022 0912   CHOLHDL 3.0 03/02/2022 0912   LDLCALC 82 03/02/2022 0912    CBC    Component Value Date/Time   WBC 15.0 (H) 05/03/2022 0845   RBC 4.10 (L) 05/03/2022 0845   HGB 12.1 (L) 05/03/2022 0845   HGB 12.6 (L) 12/29/2021 1020   HCT 37.4 (L) 05/03/2022 0845   HCT 39.2 12/29/2021 1020   PLT 271 05/03/2022 0845   PLT 306 12/29/2021 1020   MCV 91.2 05/03/2022 0845   MCV 89 12/29/2021 1020   MCH 29.5 05/03/2022 0845   MCHC 32.4 05/03/2022 0845   RDW 14.1 05/03/2022 0845   RDW 12.9 12/29/2021 1020   LYMPHSABS 0.7 05/03/2022 0845   LYMPHSABS 1.3 12/29/2021 1020   MONOABS 0.7 05/03/2022 0845   EOSABS 0.1 05/03/2022 0845   EOSABS 0.3 12/29/2021 1020   BASOSABS 0.0 05/03/2022 0845   BASOSABS 0.1 12/29/2021 1020    Lab Results  Component Value Date   HGBA1C 6.4 (H) 03/02/2022    Assessment & Plan:  1. COPD with chronic bronchitis Acute exacerbation has resolved Continue course of Augmentin until completed Continue daily dose of prednisone, Breztri and albuterol MDI  2. Type 2 diabetes mellitus without complication, without long-term current use of insulin (HCC) Controlled with A1c of 6.4 Continue glipizide Counseled on Diabetic diet, my plate method, 409 minutes of moderate intensity exercise/week Blood sugar logs with fasting goals of 80-120 mg/dl, random of less than 811 and in the event of sugars less than 60 mg/dl or greater than 914 mg/dl encouraged to notify the clinic. Advised on the need for annual eye exams, annual foot exams, Pneumonia vaccine.   3. Chronic respiratory failure with hypoxia (HCC) Currently at baseline Continue 2 L oxygen  4. Primary hypertension Controlled Continue metoprolol, losartan Counseled on blood pressure goal of less than 130/80, low-sodium, DASH diet, medication compliance, 150 minutes of moderate intensity exercise per  week. Discussed medication compliance, adverse effects.    No orders of the defined types were placed in this encounter.   Follow-up: Return in about 3 months (around 08/10/2022) for Chronic medical conditions.       Hoy Register, MD, FAAFP. [facility] [place], [place] [phone]   05/10/2022, 11:53 AM

## 2022-05-12 ENCOUNTER — Ambulatory Visit: Payer: Medicaid Other | Admitting: Cardiovascular Disease

## 2022-05-12 NOTE — Progress Notes (Deleted)
Cardiology Office Note:   Date:  05/12/2022  NAME:  James [name]    MRN: [id number] DOB:  1960/07/28   PCP:  [name], MD  Cardiologist:  Reatha Harps, MD  Electrophysiologist:  None   Referring MD: [name], MD   No chief complaint on file. ***  History of Present Illness:   James [name] is a 62 y.o. male with a hx of *** who is being seen today for the evaluation of *** at the request of ***.  Problem List COPD -severe 2. LHC -normal 12/07/2021 3. SVT -12/2021 -> COPD/PNA  Past Medical History: Past Medical History:  Diagnosis Date   Acute on chronic respiratory failure with hypoxia (HCC) 06/08/2013   Acute on chronic respiratory failure with hypoxia and hypercapnia (HCC) 06/08/2013   Alcohol use disorder 11/23/2018   CAP (community acquired pneumonia) 05/17/2018   See admit 05/17/18 ? rml  with   covid pcr neg - rx augmentin > f/u cxr in 4-6 weeks is fine unless condition declines    Community acquired pneumonia 07/12/2021   Community acquired pneumonia of right lower lobe of lung 05/17/2018   See admit 05/17/18 ? 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 OR;  Service: Thoracic;  Laterality: Left;   VIDEO BRONCHOSCOPY N/A 06/11/2013   Procedure: VIDEO BRONCHOSCOPY;  Surgeon: Kerin Perna, MD;  Location: [facility] OR;  Service: Thoracic;  Laterality: N/A;   VIDEO BRONCHOSCOPY N/A 08/10/2018   Procedure: VIDEO BRONCHOSCOPY WITH FLUORO;  Surgeon: Lorin Glass, MD;  Location: [facility] ENDOSCOPY;  Service: Endoscopy;  Laterality: N/A;    Current Medications: No outpatient medications have been marked as taking for the 05/13/22 encounter (Appointment) with O'Neal, Ronnald Ramp, MD.     Allergies:    Patient has no known allergies.   Social History: Social History   Socioeconomic History   Marital status: Divorced    Spouse name: Not on file   Number of children: Not on file   Years of education: Not on file   Highest education level: Not on file  Occupational History   Occupation: Curator   Occupation: Painter  Tobacco Use   Smoking status: Former    Packs/day: 1.50    Years: 40.00    Additional pack years: 0.00    Total pack years: 60.00    Types: Cigarettes    Quit date: 2014    Years since quitting: 10.3  Smokeless tobacco: Former    Types: Associate Professor Use: Never used  Substance and Sexual Activity   Alcohol use: Not Currently    Alcohol/week: 28.0 standard drinks of alcohol    Types: 28 Cans of beer per week    Comment: 4 beers a night previously   Drug use: Not Currently    Types: Marijuana    Comment: daily; quit using crack and methamphetamine about 5 months ago    Sexual activity: Not Currently    Partners: Female  Other Topics Concern   Not on file  Social History Narrative   Lives alone near [place]   Social Determinants of Health   Financial Resource Strain: Low Risk   (12/25/2020)   Overall Financial Resource Strain (CARDIA)    Difficulty of Paying Living Expenses: Not very hard  Recent Concern: Financial Resource Strain - High Risk (10/29/2020)   Overall Financial Resource Strain (CARDIA)    Difficulty of Paying Living Expenses: Hard  Food Insecurity: No Food Insecurity (04/19/2022)   Hunger Vital Sign    Worried About Running Out of Food in the Last Year: Never true    Ran Out of Food in the Last Year: Never true  Transportation Needs: Unmet Transportation Needs (04/14/2022)   PRAPARE - Transportation    Lack of Transportation (Medical): Yes    Lack of Transportation (Non-Medical): Yes  Physical Activity: Inactive (12/11/2020)   Exercise Vital Sign    Days of Exercise per Week: 0 days    Minutes of Exercise per Session: 0 min  Stress: No Stress Concern Present (12/25/2020)   [name] of Occupational Health - Occupational Stress Questionnaire    Feeling of Stress : Not at all  Social Connections: Moderately Integrated (11/13/2020)   Social Connection and Isolation Panel [NHANES]    Frequency of Communication with Friends and Family: More than three times a week    Frequency of Social Gatherings with Friends and Family: Once a week    Attends Religious Services: 1 to 4 times per year    Active Member of [employer] or Organizations: Yes    Attends Banker Meetings: 1 to 4 times per year    Marital Status: Divorced     Family History: The patient's ***family history includes COPD in his sister; Heart disease in his father.  ROS:   All other ROS reviewed and negative. Pertinent positives noted in the HPI.     EKGs/Labs/Other Studies Reviewed:   The following studies were personally reviewed by me today:  EKG:  EKG is *** ordered today.  The ekg ordered today demonstrates ***, and was personally reviewed by me.   TTE 11/29/2021  1. Left ventricular ejection fraction, by estimation, is 50 to 55%. The  left ventricle has low  normal function. The left ventricle has no regional  wall motion abnormalities. Left ventricular diastolic parameters are  consistent with Grade I diastolic  dysfunction (impaired relaxation).   2. Right ventricular systolic function is normal. The right ventricular  size is normal. Tricuspid regurgitation signal is inadequate for assessing  PA pressure.   3. The mitral valve is normal in structure. No evidence of mitral valve  regurgitation.   4. The aortic valve is grossly normal. Aortic valve regurgitation is not  visualized. No aortic stenosis is present.   5. The inferior vena cava is normal in size with greater than 50%  respiratory variability, suggesting right atrial pressure of 3 mmHg.  Recent Labs: 12/06/2021: Magnesium 2.1 12/29/2021: ALT 15 05/03/2022: B Natriuretic Peptide 24.8; BUN 20; Creatinine, Ser 1.09; Hemoglobin 12.1; Platelets 271; Potassium 4.5; Sodium 140   Recent Lipid Panel    Component Value Date/Time   CHOL 158 03/02/2022 0912   TRIG 140 03/02/2022 0912   HDL 52 03/02/2022 0912   CHOLHDL 3.0 03/02/2022 0912   LDLCALC 82 03/02/2022 0912    Physical Exam:   VS:  There were no vitals taken for this visit.   Wt Readings from Last 3 Encounters:  05/10/22 207 lb 6.4 oz (94.1 kg)  05/03/22 211 lb (95.7 kg)  04/01/22 211 lb (95.7 kg)    General: Well nourished, well developed, in no acute distress Head: Atraumatic, normal size  Eyes: PEERLA, EOMI  Neck: Supple, no JVD Endocrine: No thryomegaly Cardiac: Normal S1, S2; RRR; no murmurs, rubs, or gallops Lungs: Clear to auscultation bilaterally, no wheezing, rhonchi or rales  Abd: Soft, nontender, no hepatomegaly  Ext: No edema, pulses 2+ Musculoskeletal: No deformities, BUE and BLE strength normal and equal Skin: Warm and dry, no rashes   Neuro: Alert and oriented to person, place, time, and situation, CNII-XII grossly intact, no focal deficits  Psych: Normal mood and affect   ASSESSMENT:   James [name] is a 62 y.o. male who presents for the following: No diagnosis found.  PLAN:   There are no diagnoses linked to this encounter.  {Are you ordering a CV Procedure (e.g. stress test, cath, DCCV, TEE, etc)?   Press F2        :[id number]}  Disposition: No follow-ups on file.  Medication Adjustments/Labs and Tests Ordered: Current medicines are reviewed at length with the patient today.  Concerns regarding medicines are outlined above.  No orders of the defined types were placed in this encounter.  No orders of the defined types were placed in this encounter.   There are no Patient Instructions on file for this visit.   Time Spent with Patient: I have spent a total of *** minutes with patient reviewing hospital notes, telemetry, EKGs, labs and examining the patient as well as establishing an assessment and plan that was discussed with the patient.  > 50% of time was spent in direct patient care.  Signed, Lenna Gilford. Flora Lipps, MD, [facility]  [facility]  [address] [place], [place] [address] [phone]  05/12/2022 6:56 PM

## 2022-05-13 ENCOUNTER — Ambulatory Visit: Payer: Medicaid Other | Attending: Cardiovascular Disease | Admitting: Cardiovascular Disease

## 2022-05-13 ENCOUNTER — Other Ambulatory Visit: Payer: Self-pay

## 2022-05-13 DIAGNOSIS — I10 Essential (primary) hypertension: Secondary | ICD-10-CM

## 2022-05-13 DIAGNOSIS — I471 Supraventricular tachycardia, unspecified: Secondary | ICD-10-CM

## 2022-05-14 ENCOUNTER — Other Ambulatory Visit: Payer: Self-pay

## 2022-05-17 ENCOUNTER — Other Ambulatory Visit (HOSPITAL_COMMUNITY): Payer: Self-pay

## 2022-05-17 NOTE — Progress Notes (Signed)
Paramedicine Encounter    Patient ID: James [name], male    DOB: 01-Oct-1960, 62 y.o.   MRN: [id number]   Complaints-none specific -yes  Edema-slight   Compliance with meds-yes  Pill box filled-n/a If so, by whom-n/a  Refills needed-none     Pt reports he is feeling so-so. Same as usual.  No increased issus. He did miss a cardiologist appoint last week. He said he was going to call  melanie to see if she can help resch that and set up that txp for him  He has the lyrica ready for p/u but he is going to have to get ride to p/u since they are not able to leave his meds if he is not here. He is in and out a lot of the home.  Appetite ok.   He has a slight wheeze to upper rt side.  He reports cough is getting better.  Has all meds except the lyrica.   Also looks like the freestyle is on his list-I wrote a note for him to ask to get that as well when he goes to pharmacy.  Using inhalers/nebs.  Will f/u in a few wks.    BP 118/70   Pulse 76   Resp 18   SpO2 97%    Patient Care Team: [name], MD as PCP - General (Pulmonary Disease) O'Neal, Ronnald Ramp, MD as PCP - Cardiology (Cardiology) [name], RN as Case Manager Gustavus Bryant, LCSW as [facility] Care Management (Licensed Clinical Social Worker)  Patient Active Problem List   Diagnosis Date Noted   Malignant neoplasm of unspecified part of right bronchus or lung (HCC) 03/02/2022   Alcohol dependence in remission (HCC) 03/02/2022   Aortic root dilation (HCC) 03/02/2022   Vision changes 12/29/2021   Oropharyngeal dysphagia 12/29/2021   Onychomycosis 12/29/2021   Malnutrition of moderate degree 12/05/2021   Tinea cruris 07/21/2021   Multiple lung nodules on CT 06/11/2021   COPD with chronic bronchitis 02/01/2021   GERD (gastroesophageal reflux disease) 02/01/2021   Neuropathy 09/29/2020   Bronchiectasis (HCC) 09/24/2019   Type 2 diabetes mellitus without complication, without  long-term current use of insulin (HCC) 07/24/2019   Other atopic dermatitis 05/01/2019   Chronic respiratory failure with hypoxia (HCC) 04/23/2019   Intellectual disability 04/23/2019   Hypertension 05/17/2016   Former tobacco use 04/06/2016   COPD mixed type (HCC) 03/26/2016    Current Outpatient Medications:    albuterol (PROVENTIL) (2.5 MG/3ML) 0.083% nebulizer solution, Take 3 mLs (2.5 mg total) by nebulization every 4 (four) hours as needed for wheezing or shortness of breath., Disp: 270 mL, Rfl: 1   albuterol (VENTOLIN HFA) 108 (90 Base) MCG/ACT inhaler, Inhale 2 puffs into the lungs every 6 (six) hours as needed for wheezing or shortness of breath., Disp: 54 g, Rfl: 1   atorvastatin (LIPITOR) 10 MG tablet, Take 1 tablet (10 mg total) by mouth daily., Disp: 90 tablet, Rfl: 3   Budeson-Glycopyrrol-Formoterol (BREZTRI AEROSPHERE) 160-9-4.8 MCG/ACT AERO, Inhale 2 puffs into the lungs in the morning and at bedtime., Disp: 10.7 g, Rfl: 11   Cholecalciferol (VITAMIN D3) 25 MCG (1000 UT) tablet, Take 1 tablet (1,000 Units total) by mouth daily., Disp: 90 tablet, Rfl: 2   Continuous Blood Gluc Receiver (FREESTYLE LIBRE 2 READER) DEVI, Use to check blood sugar continuously throughout the day., Disp: 1 each, Rfl: 0   Continuous Blood Gluc Sensor (FREESTYLE LIBRE 2 SENSOR) MISC, Use to check blood  sugar continuously throughout the day., Disp: 2 each, Rfl: 6   furosemide (LASIX) 40 MG tablet, Take 1 tablet (40 mg total) by mouth daily., Disp: 90 tablet, Rfl: 1   glipiZIDE (GLUCOTROL XL) 5 MG 24 hr tablet, Take 1 tablet (5 mg total) by mouth daily with breakfast., Disp: 90 tablet, Rfl: 1   losartan (COZAAR) 100 MG tablet, Take 1 tablet (100 mg total) by mouth daily., Disp: 90 tablet, Rfl: 2   metoprolol succinate (TOPROL-XL) 50 MG 24 hr tablet, Take 1 tablet (50 mg total) by mouth daily. Take with or immediately following a meal., Disp: 90 tablet, Rfl: 2   nystatin cream (MYCOSTATIN), Apply to affected  areas 3 (three) times daily., Disp: 30 g, Rfl: 2   pantoprazole (PROTONIX) 40 MG tablet, Take 1 tablet (40 mg total) by mouth daily., Disp: 90 tablet, Rfl: 6   predniSONE (DELTASONE) 10 MG tablet, Take 1 tablet (10 mg total) by mouth daily., Disp: 90 tablet, Rfl: 6   pregabalin (LYRICA) 100 MG capsule, Take 1 capsule (100 mg total) by mouth 2 (two) times daily., Disp: 60 capsule, Rfl: 2   amoxicillin-clavulanate (AUGMENTIN) 875-125 MG tablet, Take 1 tablet by mouth every 12 (twelve) hours. (Patient not taking: Reported on 05/17/2022), Disp: 14 tablet, Rfl: 0 No Known Allergies    Social History   Socioeconomic History   Marital status: Divorced    Spouse name: Not on file   Number of children: Not on file   Years of education: Not on file   Highest education level: Not on file  Occupational History   Occupation: Curator   Occupation: Education administrator  Tobacco Use   Smoking status: Former    Packs/day: 1.50    Years: 40.00    Additional pack years: 0.00    Total pack years: 60.00    Types: Cigarettes    Quit date: 2014    Years since quitting: 10.3   Smokeless tobacco: Former    Types: Associate Professor Use: Never used  Substance and Sexual Activity   Alcohol use: Not Currently    Alcohol/week: 28.0 standard drinks of alcohol    Types: 28 Cans of beer per week    Comment: 4 beers a night previously   Drug use: Not Currently    Types: Marijuana    Comment: daily; quit using crack and methamphetamine about 5 months ago    Sexual activity: Not Currently    Partners: Female  Other Topics Concern   Not on file  Social History Narrative   Lives alone near [place]   Social Determinants of Health   Financial Resource Strain: Low Risk  (12/25/2020)   Overall Financial Resource Strain (CARDIA)    Difficulty of Paying Living Expenses: Not very hard  Recent Concern: Financial Resource Strain - High Risk (10/29/2020)   Overall Financial Resource Strain (CARDIA)     Difficulty of Paying Living Expenses: Hard  Food Insecurity: No Food Insecurity (04/19/2022)   Hunger Vital Sign    Worried About Running Out of Food in the Last Year: Never true    Ran Out of Food in the Last Year: Never true  Transportation Needs: Unmet Transportation Needs (04/14/2022)   PRAPARE - Transportation    Lack of Transportation (Medical): Yes    Lack of Transportation (Non-Medical): Yes  Physical Activity: Inactive (12/11/2020)   Exercise Vital Sign    Days of Exercise per Week: 0 days    Minutes of Exercise per  Session: 0 min  Stress: No Stress Concern Present (12/25/2020)   [name] of Occupational Health - Occupational Stress Questionnaire    Feeling of Stress : Not at all  Social Connections: Moderately Integrated (11/13/2020)   Social Connection and Isolation Panel [NHANES]    Frequency of Communication with Friends and Family: More than three times a week    Frequency of Social Gatherings with Friends and Family: Once a week    Attends Religious Services: 1 to 4 times per year    Active Member of [employer] or Organizations: Yes    Attends Banker Meetings: 1 to 4 times per year    Marital Status: Divorced  Intimate Partner Violence: Not At Risk (12/03/2021)   Humiliation, Afraid, Rape, and Kick questionnaire    Fear of Current or Ex-Partner: No    Emotionally Abused: No    Physically Abused: No    Sexually Abused: No    Physical Exam      Future Appointments  Date Time Provider Department Center  05/25/2022  9:00 AM [name], RN [facility] None  06/09/2022  9:30 AM [name], MD [facility] None  07/13/2022  9:45 AM Louann Sjogren, DPM [facility]  08/10/2022  9:30 AM [name], MD [facility] None       Kerry Hough, Paramedic [phone] [facility] Paramedic  05/17/22

## 2022-05-19 ENCOUNTER — Other Ambulatory Visit: Payer: Self-pay

## 2022-05-19 ENCOUNTER — Other Ambulatory Visit (HOSPITAL_COMMUNITY): Payer: Self-pay

## 2022-05-21 ENCOUNTER — Other Ambulatory Visit: Payer: Self-pay

## 2022-05-25 ENCOUNTER — Encounter: Payer: Self-pay | Admitting: Pharmacist

## 2022-05-25 ENCOUNTER — Other Ambulatory Visit (HOSPITAL_COMMUNITY): Payer: Self-pay

## 2022-05-25 ENCOUNTER — Other Ambulatory Visit: Payer: Self-pay

## 2022-05-25 ENCOUNTER — Encounter: Payer: Self-pay | Admitting: *Deleted

## 2022-05-25 ENCOUNTER — Other Ambulatory Visit: Payer: Medicaid Other | Admitting: *Deleted

## 2022-05-25 NOTE — Patient Instructions (Signed)
Visit Information  [name] was given information about Medicaid Managed Care team care coordination services as a part of their Healthy [facility] Medicaid benefit. [name] verbally consented to engagement with the [facility] Managed Care team.   If you are experiencing a medical emergency, please call 911 or report to your local emergency department or urgent care.   If you have a non-emergency medical problem during routine business hours, please contact your provider's office and ask to speak with a nurse.   For questions related to your Healthy [facility] health plan, please call: [phone] or visit the homepage here: [email or web address]  If you would like to schedule transportation through your Healthy [facility] plan, please call the following number at least 2 days in advance of your appointment: [phone]  For information about your ride after you set it up, call Ride Assist at [phone]. Use this number to activate a Will Call pickup, or if your transportation is late for a scheduled pickup. Use this number, too, if you need to make a change or cancel a previously scheduled reservation.  If you need transportation services right away, call [phone]. The after-hours call center is staffed 24 hours to handle ride assistance and urgent reservation requests (including discharges) 365 days a year. Urgent trips include sick visits, hospital discharge requests and life-sustaining treatment.  Call the [facility] Line at [phone], at any time, 24 hours a day, 7 days a week. If you are in danger or need immediate medical attention call 911.  If you would like help to quit smoking, call 1-800-QUIT-NOW (203-836-0119) OR Espaol: 1-855-Djelo-Ya (4-742-595-6387) o para ms informacin haga clic aqu or Text READY to 564-332 to register via text  [name],   Please see education materials related to COPD provided as print  materials.   The patient verbalized understanding of instructions, educational materials, and care plan provided today and agreed to receive a mailed copy of patient instructions, educational materials, and care plan.   Telephone follow up appointment with Managed Medicaid care management team member scheduled for:06/25/22 @ 9am  [name] RN, BSN [place]  Managed [facility] RN Care Coordinator [phone]   Following is a copy of your plan of care:  Care Plan : RN Care Manager Plan of Care  Updates made by [name], RN since 05/25/2022 12:00 AM     Problem: Chronic Disease Management needs in patient with COPD   Priority: High     Long-Range Goal: Plan of care established for for Chronic Disease management for patient with COPD   Start Date: 11/13/2020  Expected End Date: 06/04/2022  Priority: High  Note:   Current Barriers:  Care Coordination needs related to Limited access to food  Chronic Disease Management support and education needs related to COPD [name] has all prescribed medications. He will request refills for two medications needing refilled. He denies any needs today.   RNCM Clinical Goal(s):  Patient will verbalize understanding of plan for management of COPD as evidenced by taking all medications as prescribed, attending all scheduled appointments and contacting provider with questions and concerns take all medications exactly as prescribed and will call provider for medication related questions as evidenced by refilling medications and taking as prescribed    attend all scheduled medical appointments: 05/13/22 with Cardiology and 06/09/22 with PCP as evidenced by provider notes in EMR        demonstrate improved adherence to prescribed treatment plan for COPD as evidenced by  taking all medications as prescribed, attending all scheduled appointments and contacting provider with questions and concerns continue to work with RN Care Manager and/or Social  Worker to address care management and care coordination needs related to COPD as evidenced by adherence to CM Team Scheduled appointments     through collaboration with RN Care manager, provider, and care team.   Interventions: Inter-disciplinary care team collaboration (see longitudinal plan of care) Evaluation of current treatment plan related to  self management and patient's adherence to plan as established by provider Reviewed transportation arrangements to upcoming appointment, 05/28/22 pick up time 9:15 am going to [facility], will call for return ride home [phone], 06/09/22 pick up time 8:30 am going to [facility], will call for return ride home [phone] Assisted with contacting pharmacy to request Albuterol inhaler and Breztri inhaler Assisted with contacting [facility] to reschedule missed appointment, scheduled on 05/28/22   SDOH Barriers (Status:  Goal on track:  Yes. Patient interviewed and SDOH assessment performed        SDOH Interventions    Flowsheet Row Patient Outreach Telephone from 05/25/2022 in [place] POPULATION HEALTH DEPARTMENT Patient Outreach Telephone from 04/19/2022 in [place] POPULATION HEALTH DEPARTMENT Patient Outreach Telephone from 04/14/2022 in [place] POPULATION HEALTH DEPARTMENT Patient Outreach Telephone from 03/19/2022 in [place] POPULATION HEALTH DEPARTMENT Patient Outreach Telephone from 02/17/2022 in [place] POPULATION HEALTH DEPARTMENT Patient Outreach Telephone from 01/18/2022 in [place] POPULATION HEALTH DEPARTMENT  SDOH Interventions        Food Insecurity Interventions -- Intervention Not Indicated -- -- Intervention Not Indicated Intervention Not Indicated  Housing Interventions Intervention Not Indicated -- -- -- Intervention Not Indicated --  Transportation Interventions Payor Benefit  [Assisted with arranging transportation to upcoming appointments] -- Payor Benefit  [name] assisted with  arranging transportation with UHC] Payor Benefit  [assisted with arranging transportation to upcoming appointments] Payor Benefit  [Assisted with arranging transportation] --  Utilities Interventions -- Intervention Not Indicated -- -- Other (Comment)  [Collaborated with BSW for utility assistance] Intervention Not Indicated       Patient interviewed and appropriate assessments performed Discussed plans with patient for ongoing care management follow up and provided patient with direct contact information for care management team-   COPD: (Status: Goal on Track (progressing): YES.) Long Term Goal  Reviewed medications with patient, assisted with requesting needed refills Advised patient to self assesses COPD action plan zone and make appointment with provider if in the yellow zone for 48 hours without improvement Provided education about and advised patient to utilize infection prevention strategies to reduce risk of respiratory infection Discussed the importance of adequate rest and management of fatigue with COPD Advised patient to avoid going out in the cold, cover mouth with a scarf when going out Infection prevention reviewed Encouraged patient to continue to avoid drinking alcohol and avoid smoke Discussed triggers and avoiding being out in the heat during the hottest part of the day   Patient Goals/Self-Care Activities: Take medications as prescribed   Attend all scheduled provider appointments Call pharmacy for medication refills 3-7 days in advance of running out of medications Perform all self care activities independently  Call provider office for new concerns or questions  - avoid second hand smoke - limit outdoor activity during cold weather - keep follow-up appointments: 05/13/22 with Cardiology - eat healthy/prescribed diet: DASH - get at least 7 to 8 hours of sleep at night

## 2022-05-25 NOTE — Patient Outreach (Signed)
Medicaid Managed Care   Nurse Care Manager Note  05/25/2022 Name:  James [name] MRN:  [id number] DOB:  [birth year]  James [name] is an 62 y.o. year old male who is a primary patient of [facility] Charlcie Cradle, MD.  [facility] Managed Care Coordination team was consulted for assistance with:    COPD  James [name] was given information about Medicaid Managed Care Coordination team services today. [name] Patient agreed to services and verbal consent obtained.  Engaged with patient by telephone for follow up visit in response to provider referral for case management and/or care coordination services.   Assessments/Interventions:  Review of past medical history, allergies, medications, health status, including review of consultants reports, laboratory and other test data, was performed as part of comprehensive evaluation and provision of chronic care management services.  SDOH (Social Determinants of Health) assessments and interventions performed: SDOH Interventions    Flowsheet Row Patient Outreach Telephone from 05/25/2022 in [place] POPULATION HEALTH DEPARTMENT Patient Outreach Telephone from 04/19/2022 in [place] POPULATION HEALTH DEPARTMENT Patient Outreach Telephone from 04/14/2022 in [place] POPULATION HEALTH DEPARTMENT Patient Outreach Telephone from 03/19/2022 in [place] POPULATION HEALTH DEPARTMENT Patient Outreach Telephone from 02/17/2022 in [place] POPULATION HEALTH DEPARTMENT Patient Outreach Telephone from 01/18/2022 in [place] POPULATION HEALTH DEPARTMENT  SDOH Interventions        Food Insecurity Interventions -- Intervention Not Indicated -- -- Intervention Not Indicated Intervention Not Indicated  Housing Interventions Intervention Not Indicated -- -- -- Intervention Not Indicated --  Transportation Interventions Payor Benefit  [Assisted with arranging transportation to upcoming appointments] -- Payor Benefit  [facility] assisted with arranging transportation  with [employer]] Payor Benefit  [assisted with arranging transportation to upcoming appointments] Payor Benefit  [Assisted with arranging transportation] --  Utilities Interventions -- Intervention Not Indicated -- -- Other (Comment)  [Collaborated with BSW for utility assistance] Intervention Not Indicated       Care Plan  No Known Allergies  Medications Reviewed Today     Reviewed by [name], RN (Registered Nurse) on 05/25/22 at 0919  Med List Status: <None>   Medication Order Taking? Sig Documenting Provider Last Dose Status Informant  albuterol (PROVENTIL) (2.5 MG/3ML) 0.083% nebulizer solution [id number] Yes Take 3 mLs (2.5 mg total) by nebulization every 4 (four) hours as needed for wheezing or shortness of breath. [name], MD Taking Active   albuterol (VENTOLIN HFA) 108 [phone] Base) MCG/ACT inhaler [id number] No Inhale 2 puffs into the lungs every 6 (six) hours as needed for wheezing or shortness of breath.  Patient not taking: Reported on 05/25/2022   [name], MD Not Taking Active            Med Note [name], Tayler Lassen A   Tue May 25, 2022  9:15 AM) Needs refill  amoxicillin-clavulanate (AUGMENTIN) 875-125 MG tablet [id number] No Take 1 tablet by mouth every 12 (twelve) hours.  Patient not taking: Reported on 05/17/2022   Loetta Rough, MD Not Taking Active            Med Note [name], Peola Joynt A   Tue May 25, 2022  9:16 AM) completed  atorvastatin (LIPITOR) 10 MG tablet [id number] Yes Take 1 tablet (10 mg total) by mouth daily. [name], MD Taking Active   Budeson-Glycopyrrol-Formoterol (BREZTRI AEROSPHERE) 160-9-4.8 MCG/ACT [name] [id number] No Inhale 2 puffs into the lungs in the morning and at bedtime.  Patient not taking: Reported on 05/25/2022   Thora Lance,  Ivor Costa, MD Not Taking Active Pharmacy Records           Med Note [name], Mercer Stallworth A   Tue May 25, 2022  9:16 AM) Needs refill  Cholecalciferol (VITAMIN D3) 25 MCG (1000 UT) tablet [id number] No Take 1 tablet  (1,000 Units total) by mouth daily.  Patient not taking: Reported on 05/25/2022   [name], MD Not Taking Active   Continuous Blood Gluc Receiver (FREESTYLE LIBRE 2 READER) [place] [id number] No Use to check blood sugar continuously throughout the day.  Patient not taking: Reported on 05/25/2022   [name], MD Not Taking Active   Continuous Blood Gluc Sensor (FREESTYLE LIBRE 2 SENSOR) [place] [id number] No Use to check blood sugar continuously throughout the day.  Patient not taking: Reported on 05/25/2022   [name], MD Not Taking Active   furosemide (LASIX) 40 MG tablet [id number] Yes Take 1 tablet (40 mg total) by mouth daily. [name], MD Taking Active   glipiZIDE (GLUCOTROL XL) 5 MG 24 hr tablet [id number] Yes Take 1 tablet (5 mg total) by mouth daily with breakfast. [name], MD Taking Active   losartan (COZAAR) 100 MG tablet [id number] Yes Take 1 tablet (100 mg total) by mouth daily. [name], MD Taking Active   metoprolol succinate (TOPROL-XL) 50 MG 24 hr tablet [id number] Yes Take 1 tablet (50 mg total) by mouth daily. Take with or immediately following a meal. [name], MD Taking Active            Med Note [facility]   Mon May 17, 2022  4:14 PM)    nystatin cream (MYCOSTATIN) [id number] Yes Apply to affected areas 3 (three) times daily. [name], MD Taking Active   pantoprazole (PROTONIX) 40 MG tablet [id number] Yes Take 1 tablet (40 mg total) by mouth daily. [name], MD Taking Active   predniSONE (DELTASONE) 10 MG tablet [id number] Yes Take 1 tablet (10 mg total) by mouth daily. [name], MD Taking Active   pregabalin (LYRICA) 100 MG capsule [id number] No Take 1 capsule (100 mg total) by mouth 2 (two) times daily.  Patient not taking: Reported on 05/25/2022   [name], MD Not Taking Active            Med Note Tawnya Crook Apr 19, 2022  9:13 AM)              Patient Active Problem List    Diagnosis Date Noted   Malignant neoplasm of unspecified part of right bronchus or lung (HCC) 03/02/2022   Alcohol dependence in remission (HCC) 03/02/2022   Aortic root dilation (HCC) 03/02/2022   Vision changes 12/29/2021   Oropharyngeal dysphagia 12/29/2021   Onychomycosis 12/29/2021   Malnutrition of moderate degree 12/05/2021   Tinea cruris 07/21/2021   Multiple lung nodules on CT 06/11/2021   COPD with chronic bronchitis 02/01/2021   GERD (gastroesophageal reflux disease) 02/01/2021   Neuropathy 09/29/2020   Bronchiectasis (HCC) 09/24/2019   Type 2 diabetes mellitus without complication, without long-term current use of insulin (HCC) 07/24/2019   Other atopic dermatitis 05/01/2019   Chronic respiratory failure with hypoxia (HCC) 04/23/2019   Intellectual disability 04/23/2019   Hypertension 05/17/2016   Former tobacco use 04/06/2016   COPD mixed type (HCC) 03/26/2016    Conditions to be addressed/monitored per PCP order:  COPD  Care Plan : RN Care Manager Plan of Care  Updates  made by [name], RN since 05/25/2022 12:00 AM     Problem: Chronic Disease Management needs in patient with COPD   Priority: High     Long-Range Goal: Plan of care established for for Chronic Disease management for patient with COPD   Start Date: 11/13/2020  Expected End Date: 06/04/2022  Priority: High  Note:   Current Barriers:  Care Coordination needs related to Limited access to food  Chronic Disease Management support and education needs related to COPD James [name] has all prescribed medications. He will request refills for two medications needing refilled. He denies any needs today.   RNCM Clinical Goal(s):  Patient will verbalize understanding of plan for management of COPD as evidenced by taking all medications as prescribed, attending all scheduled appointments and contacting provider with questions and concerns take all medications exactly as prescribed and will call provider  for medication related questions as evidenced by refilling medications and taking as prescribed    attend all scheduled medical appointments: 05/13/22 with Cardiology and 06/09/22 with PCP as evidenced by provider notes in EMR        demonstrate improved adherence to prescribed treatment plan for COPD as evidenced by taking all medications as prescribed, attending all scheduled appointments and contacting provider with questions and concerns continue to work with RN Care Manager and/or Social Worker to address care management and care coordination needs related to COPD as evidenced by adherence to CM Team Scheduled appointments     through collaboration with Medical illustrator, provider, and care team.   Interventions: Inter-disciplinary care team collaboration (see longitudinal plan of care) Evaluation of current treatment plan related to  self management and patient's adherence to plan as established by provider Reviewed transportation arrangements to upcoming appointment, 05/28/22 pick up time 9:15 am going to [facility], will call for return ride home [phone], 06/09/22 pick up time 8:30 am going to [facility], will call for return ride home [phone] Assisted with contacting pharmacy to request Albuterol inhaler and Breztri inhaler Assisted with contacting [facility] to reschedule missed appointment, scheduled on 05/28/22   SDOH Barriers (Status:  Goal on track:  Yes. Patient interviewed and SDOH assessment performed        SDOH Interventions    Flowsheet Row Patient Outreach Telephone from 05/25/2022 in [place] POPULATION HEALTH DEPARTMENT Patient Outreach Telephone from 04/19/2022 in [place] POPULATION HEALTH DEPARTMENT Patient Outreach Telephone from 04/14/2022 in [place] POPULATION HEALTH DEPARTMENT Patient Outreach Telephone from 03/19/2022 in [place] POPULATION HEALTH DEPARTMENT Patient Outreach Telephone from 02/17/2022 in [place] POPULATION  HEALTH DEPARTMENT Patient Outreach Telephone from 01/18/2022 in  POPULATION HEALTH DEPARTMENT  SDOH Interventions        Food Insecurity Interventions -- Intervention Not Indicated -- -- Intervention Not Indicated Intervention Not Indicated  Housing Interventions Intervention Not Indicated -- -- -- Intervention Not Indicated --  Transportation Interventions Payor Benefit  [Assisted with arranging transportation to upcoming appointments] -- Payor Benefit  [facility] assisted with arranging transportation with [employer]] Payor Benefit  [assisted with arranging transportation to upcoming appointments] Payor Benefit  [Assisted with arranging transportation] --  Utilities Interventions -- Intervention Not Indicated -- -- Other (Comment)  [Collaborated with BSW for utility assistance] Intervention Not Indicated       Patient interviewed and appropriate assessments performed Discussed plans with patient for ongoing care management follow up and provided patient with direct contact information for care management team-  COPD: (Status: Goal on Track (progressing): YES.) Long Term Goal  Reviewed medications with patient, assisted with requesting needed refills Advised patient to self assesses COPD action plan zone and make appointment with provider if in the yellow zone for 48 hours without improvement Provided education about and advised patient to utilize infection prevention strategies to reduce risk of respiratory infection Discussed the importance of adequate rest and management of fatigue with COPD Advised patient to avoid going out in the cold, cover mouth with a scarf when going out Infection prevention reviewed Encouraged patient to continue to avoid drinking alcohol and avoid smoke Discussed triggers and avoiding being out in the heat during the hottest part of the day   Patient Goals/Self-Care Activities: Take medications as prescribed   Attend all scheduled provider appointments Call  pharmacy for medication refills 3-7 days in advance of running out of medications Perform all self care activities independently  Call provider office for new concerns or questions  - avoid second hand smoke - limit outdoor activity during cold weather - keep follow-up appointments: 05/13/22 with Cardiology - eat healthy/prescribed diet: DASH - get at least 7 to 8 hours of sleep at night       Follow Up:  Patient agrees to Care Plan and Follow-up.  Plan: The Managed Medicaid care management team will reach out to the patient again over the next 30 days.  Date/time of next scheduled RN care management/care coordination outreach:  06/25/22 @ 9am  [name] RN, BSN [place]  Managed [facility] RN Care Coordinator [phone]

## 2022-05-26 ENCOUNTER — Other Ambulatory Visit: Payer: Self-pay

## 2022-05-26 NOTE — Progress Notes (Signed)
Cardiology Office Note:   Date:  05/28/2022  NAME:  James [name]    MRN: [id number] DOB:  [birth year]   PCP:  [name], MD  Cardiologist:  Reatha Harps, MD  Electrophysiologist:  None   Referring MD: [name], MD   Chief Complaint  Patient presents with   Follow-up        History of Present Illness:   James [name] is a 62 y.o. male with a hx of COPD, SVT, HTN, HLD, DM who presents for follow-up.   Admitted to the hospital in November 2023 with acute respiratory failure secondary to COPD exacerbation and community-acquired pneumonia.  He did have elevated troponin.  Cardiac catheterization showed normal coronary arteries. Had SVT which improved with metoprolol.   Reports he is doing well.  Denies chest pain.  Blood pressure at goal.  No rapid heartbeat sensation.  Still gets short of breath with activity.  He has severe COPD.  Volume status is acceptable.  Overall doing quite well from a cardiovascular standpoint   Problem List COPD -severe DM -A1c 6.4 HTN HLD -T chol 158, HDL 52, LDL 82, TG 140 5. SVT  Past Medical History: Past Medical History:  Diagnosis Date   Acute on chronic respiratory failure with hypoxia (HCC) 06/08/2013   Acute on chronic respiratory failure with hypoxia and hypercapnia (HCC) 06/08/2013   Alcohol use disorder 11/23/2018   CAP (community acquired pneumonia) 05/17/2018   See admit 05/17/18 ? rml  with   covid pcr neg - rx augmentin > f/u cxr in 4-6 weeks is fine unless condition declines    Community acquired pneumonia 07/12/2021   Community acquired pneumonia of right lower lobe of lung 05/17/2018   See admit 05/17/18 ? 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   Recurrent pneumonia 02/20/2021   Tick bite 06/03/2018    Past Surgical History: Past Surgical History:  Procedure Laterality Date   LEFT HEART CATH AND CORONARY ANGIOGRAPHY N/A 12/07/2021   Procedure: LEFT HEART CATH AND CORONARY ANGIOGRAPHY;  Surgeon: Runell Gess, MD;  Location: [facility];  Service: Cardiovascular;  Laterality: N/A;   LUNG REMOVAL, PARTIAL     VIDEO ASSISTED THORACOSCOPY (VATS)/THOROCOTOMY Left 06/11/2013   Procedure: VIDEO ASSISTED THORACOSCOPY (VATS)/THOROCOTOMY;  Surgeon: Kerin Perna, MD;  Location: [facility] OR;  Service: Thoracic;  Laterality: Left;   VIDEO BRONCHOSCOPY N/A 06/11/2013   Procedure: VIDEO BRONCHOSCOPY;  Surgeon: Kerin Perna, MD;  Location: [facility] OR;  Service: Thoracic;  Laterality: N/A;   VIDEO BRONCHOSCOPY N/A 08/10/2018   Procedure: VIDEO BRONCHOSCOPY WITH FLUORO;  Surgeon: Lorin Glass, MD;  Location: [facility] ENDOSCOPY;  Service: Endoscopy;  Laterality: N/A;    Current Medications: Current Meds  Medication Sig   albuterol (PROVENTIL) (2.5 MG/3ML) 0.083% nebulizer solution Take 3 mLs (2.5 mg total) by nebulization every 4 (four) hours as needed for wheezing or shortness of breath.   albuterol (VENTOLIN HFA) 108 (90 Base) MCG/ACT  inhaler Inhale 2 puffs into the lungs every 6 (six) hours as needed for wheezing or shortness of breath.   amoxicillin-clavulanate (AUGMENTIN) 875-125 MG tablet Take 1 tablet by mouth every 12 (twelve) hours.   atorvastatin (LIPITOR) 10 MG tablet Take 1 tablet (10 mg total) by mouth daily.   Budeson-Glycopyrrol-Formoterol (BREZTRI AEROSPHERE) 160-9-4.8 MCG/ACT AERO Inhale 2 puffs into the lungs in the morning and at bedtime.   Continuous Blood Gluc Receiver (FREESTYLE LIBRE 2 READER) DEVI Use to check  blood sugar continuously throughout the day.   Continuous Blood Gluc Sensor (FREESTYLE LIBRE 2 SENSOR) MISC Use to check blood sugar continuously throughout the day.   furosemide (LASIX) 40 MG tablet Take 1 tablet (40 mg total) by mouth daily.   glipiZIDE (GLUCOTROL XL) 5 MG 24 hr tablet Take 1 tablet (5 mg total) by mouth daily with breakfast.   losartan (COZAAR) 100 MG tablet Take 1 tablet (100 mg total) by mouth daily.   metoprolol succinate (TOPROL-XL) 50 MG 24 hr tablet Take 1 tablet (50 mg total) by mouth daily. Take with or immediately following a meal.   nystatin cream (MYCOSTATIN) Apply to affected areas 3 (three) times daily.   pantoprazole (PROTONIX) 40 MG tablet Take 1 tablet (40 mg total) by mouth daily.   predniSONE (DELTASONE) 10 MG tablet Take 1 tablet (10 mg total) by mouth daily.   pregabalin (LYRICA) 100 MG capsule Take 1 capsule (100 mg total) by mouth 2 (two) times daily.     Allergies:    Patient has no known allergies.   Social History: Social History   Socioeconomic History   Marital status: Divorced    Spouse name: Not on file   Number of children: Not on file   Years of education: Not on file   Highest education level: Not on file  Occupational History   Occupation: Curator   Occupation: Education administrator  Tobacco Use   Smoking status: Former    Packs/day: 1.50    Years: 40.00    Additional pack years: 0.00    Total pack years: 60.00    Types: Cigarettes    Quit date: 2014    Years since quitting: 10.4   Smokeless tobacco: Former    Types: Associate Professor Use: Never used  Substance and Sexual Activity   Alcohol use: Not Currently    Alcohol/week: 28.0 standard drinks of alcohol    Types: 28 Cans of beer per week    Comment: 4 beers a night previously   Drug use: Not Currently    Types: Marijuana    Comment: daily; quit using crack and methamphetamine about 5 months ago    Sexual activity: Not Currently    Partners: Female  Other Topics  Concern   Not on file  Social History Narrative   Lives alone near [place]   Social Determinants of Health   Financial Resource Strain: Low Risk  (12/25/2020)   Overall Financial Resource Strain (CARDIA)    Difficulty of Paying Living Expenses: Not very hard  Recent Concern: Financial Resource Strain - High Risk (10/29/2020)   Overall Financial Resource Strain (CARDIA)    Difficulty of Paying Living Expenses: Hard  Food Insecurity: No Food Insecurity (04/19/2022)   Hunger Vital Sign    Worried About Running Out of Food in the Last Year: Never true    Ran Out of Food in the Last Year: Never true  Transportation Needs: Unmet Transportation Needs (05/25/2022)   PRAPARE -  Administrator, Civil Service (Medical): Yes    Lack of Transportation (Non-Medical): Yes  Physical Activity: Inactive (12/11/2020)   Exercise Vital Sign    Days of Exercise per Week: 0 days    Minutes of Exercise per Session: 0 min  Stress: No Stress Concern Present (12/25/2020)   [name] of Occupational Health - Occupational Stress Questionnaire    Feeling of Stress : Not at all  Social Connections: Moderately Integrated (11/13/2020)   Social Connection and Isolation Panel [NHANES]    Frequency of Communication with Friends and Family: More than three times a week    Frequency of Social Gatherings with Friends and Family: Once a week    Attends Religious Services: 1 to 4 times per year    Active Member of [employer] or Organizations: Yes    Attends Banker Meetings: 1 to 4 times per year    Marital Status: Divorced     Family History: The patient's family history includes COPD in his sister; Heart disease in his father.  ROS:   All other ROS reviewed and negative. Pertinent positives noted in the HPI.     EKGs/Labs/Other Studies Reviewed:   The following studies were personally reviewed by me today:   TTE 11/29/2021  1. Left ventricular ejection fraction, by estimation, is  50 to 55%. The  left ventricle has low normal function. The left ventricle has no regional  wall motion abnormalities. Left ventricular diastolic parameters are  consistent with Grade I diastolic  dysfunction (impaired relaxation).   2. Right ventricular systolic function is normal. The right ventricular  size is normal. Tricuspid regurgitation signal is inadequate for assessing  PA pressure.   3. The mitral valve is normal in structure. No evidence of mitral valve  regurgitation.   4. The aortic valve is grossly normal. Aortic valve regurgitation is not  visualized. No aortic stenosis is present.   5. The inferior vena cava is normal in size with greater than 50%  respiratory variability, suggesting right atrial pressure of 3 mmHg.    Recent Labs: 12/06/2021: Magnesium 2.1 12/29/2021: ALT 15 05/03/2022: B Natriuretic Peptide 24.8; BUN 20; Creatinine, Ser 1.09; Hemoglobin 12.1; Platelets 271; Potassium 4.5; Sodium 140   Recent Lipid Panel    Component Value Date/Time   CHOL 158 03/02/2022 0912   TRIG 140 03/02/2022 0912   HDL 52 03/02/2022 0912   CHOLHDL 3.0 03/02/2022 0912   LDLCALC 82 03/02/2022 0912    Physical Exam:   VS:  BP 122/68 (BP Location: Left Arm, Patient Position: Sitting, Cuff Size: Normal)   Pulse 90   Ht 5\' 9"  (1.753 m)   Wt 208 lb (94.3 kg)   SpO2 96%   BMI 30.72 kg/m    Wt Readings from Last 3 Encounters:  05/28/22 208 lb (94.3 kg)  05/10/22 207 lb 6.4 oz (94.1 kg)  05/03/22 211 lb (95.7 kg)    General: Well nourished, well developed, in no acute distress Head: Atraumatic, normal size  Eyes: PEERLA, EOMI  Neck: Supple, no JVD Endocrine: No thryomegaly Cardiac: Normal S1, S2; RRR; no murmurs, rubs, or gallops Lungs: Clear to auscultation bilaterally, no wheezing, rhonchi or rales  Abd: Soft, nontender, no hepatomegaly  Ext: No edema, pulses 2+ Musculoskeletal: No deformities, BUE and BLE strength normal and equal Skin: Warm and dry, no rashes    Neuro: Alert and oriented to person, place, time, and situation, CNII-XII grossly intact, no focal deficits  Psych: Normal mood  and affect   ASSESSMENT:   James [name] is a 62 y.o. male who presents for the following: 1. SVT (supraventricular tachycardia)   2. Essential hypertension   3. Chronic obstructive pulmonary disease, unspecified COPD type (HCC)   4. Mixed hyperlipidemia     PLAN:   1. SVT (supraventricular tachycardia) -Brief SVT in the hospital.  Well-controlled on metoprolol succinate.  We will continue this.  No further recurrence.  Echo was normal.  Normal left heart catheterization.  2. Essential hypertension -Well-controlled on current medications which do include losartan 100 mg daily, metoprolol succinate 50 mg daily.  He also takes Lasix daily.  Continue current regimen.  3. Chronic obstructive pulmonary disease, unspecified COPD type (HCC) -Continue to work with pulmonary.  4. Mixed hyperlipidemia -Normal left heart cath.  On Lipitor 10.  Will continue this given history of COPD.  Negative left heart catheterization is reassuring.      Disposition: Return in about 1 year (around 05/28/2023).  Medication Adjustments/Labs and Tests Ordered: Current medicines are reviewed at length with the patient today.  Concerns regarding medicines are outlined above.  No orders of the defined types were placed in this encounter.  No orders of the defined types were placed in this encounter.   Patient Instructions  Medication Instructions:  The current medical regimen is effective;  continue present plan and medications.   *If you need a refill on your cardiac medications before your next appointment, please call your pharmacy*   Follow-Up: At [facility], you and your health needs are our priority.  As part of our continuing mission to provide you with exceptional heart care, we have created designated Provider Care Teams.  These Care Teams include your  primary Cardiologist (physician) and Advanced Practice Providers (APPs -  Physician Assistants and Nurse Practitioners) who all work together to provide you with the care you need, when you need it.  We recommend signing up for the patient portal called "MyChart".  Sign up information is provided on this After Visit Summary.  MyChart is used to connect with patients for Virtual Visits (Telemedicine).  Patients are able to view lab/test results, encounter notes, upcoming appointments, etc.  Non-urgent messages can be sent to your provider as well.   To learn more about what you can do with MyChart, go to [email or web address].    Your next appointment:   12 month(s)  Provider:   Reatha Harps, MD        Time Spent with Patient: I have spent a total of 25 minutes with patient reviewing hospital notes, telemetry, EKGs, labs and examining the patient as well as establishing an assessment and plan that was discussed with the patient.  > 50% of time was spent in direct patient care.  Signed, Lenna Gilford. Flora Lipps, MD, [facility]  [facility]  [address], [address] [place], [place] [address] [phone]  05/28/2022 10:55 AM

## 2022-05-28 ENCOUNTER — Other Ambulatory Visit: Payer: Self-pay

## 2022-05-28 ENCOUNTER — Other Ambulatory Visit (HOSPITAL_COMMUNITY): Payer: Self-pay

## 2022-05-28 ENCOUNTER — Encounter: Payer: Self-pay | Admitting: Cardiovascular Disease

## 2022-05-28 ENCOUNTER — Ambulatory Visit: Payer: Medicaid Other | Attending: Cardiovascular Disease | Admitting: Cardiovascular Disease

## 2022-05-28 VITALS — BP 122/68 | HR 90 | Ht 69.0 in | Wt 208.0 lb

## 2022-05-28 DIAGNOSIS — I471 Supraventricular tachycardia, unspecified: Secondary | ICD-10-CM | POA: Diagnosis not present

## 2022-05-28 DIAGNOSIS — I10 Essential (primary) hypertension: Secondary | ICD-10-CM | POA: Diagnosis not present

## 2022-05-28 DIAGNOSIS — E782 Mixed hyperlipidemia: Secondary | ICD-10-CM

## 2022-05-28 DIAGNOSIS — J449 Chronic obstructive pulmonary disease, unspecified: Secondary | ICD-10-CM | POA: Diagnosis not present

## 2022-05-28 NOTE — Patient Instructions (Addendum)
Medication Instructions:  The current medical regimen is effective;  continue present plan and medications.  *If you need a refill on your cardiac medications before your next appointment, please call your pharmacy*   Follow-Up: At [facility], you and your health needs are our priority.  As part of our continuing mission to provide you with exceptional heart care, we have created designated Provider Care Teams.  These Care Teams include your primary Cardiologist (physician) and Advanced Practice Providers (APPs -  Physician Assistants and Nurse Practitioners) who all work together to provide you with the care you need, when you need it.  We recommend signing up for the patient portal called "MyChart".  Sign up information is provided on this After Visit Summary.  MyChart is used to connect with patients for Virtual Visits (Telemedicine).  Patients are able to view lab/test results, encounter notes, upcoming appointments, etc.  Non-urgent messages can be sent to your provider as well.   To learn more about what you can do with MyChart, go to [email or web address].    Your next appointment:   12 month(s)  Provider:   [place] T O'Neal, MD   

## 2022-06-04 ENCOUNTER — Other Ambulatory Visit (HOSPITAL_COMMUNITY): Payer: Self-pay

## 2022-06-05 ENCOUNTER — Other Ambulatory Visit (HOSPITAL_COMMUNITY): Payer: Self-pay

## 2022-06-05 DIAGNOSIS — J9621 Acute and chronic respiratory failure with hypoxia: Secondary | ICD-10-CM | POA: Diagnosis not present

## 2022-06-05 DIAGNOSIS — J9601 Acute respiratory failure with hypoxia: Secondary | ICD-10-CM | POA: Diagnosis not present

## 2022-06-05 DIAGNOSIS — F79 Unspecified intellectual disabilities: Secondary | ICD-10-CM | POA: Diagnosis not present

## 2022-06-07 DIAGNOSIS — F79 Unspecified intellectual disabilities: Secondary | ICD-10-CM | POA: Diagnosis not present

## 2022-06-07 DIAGNOSIS — J9621 Acute and chronic respiratory failure with hypoxia: Secondary | ICD-10-CM | POA: Diagnosis not present

## 2022-06-07 DIAGNOSIS — J9601 Acute respiratory failure with hypoxia: Secondary | ICD-10-CM | POA: Diagnosis not present

## 2022-06-08 NOTE — Progress Notes (Unsigned)
Established Patient Office Visit  Subjective   Patient ID: James [name], male    DOB: 11-Jul-1960  Age: 62 y.o. MRN: [id number]  No chief complaint on file.   06/29/21 Since the last visit patient's been doing well however he ran out of his losartan for 1 week.  He does complain of an itching rash on both arms.  He is painting the inside of his house he tries to stay from the out-of-doors and where the heat is.  CT of the chest was done and showed pulmonary nodules will need to be repeated in 6 months.  Pneumonia has resolved.  He has been compliant with his inhaled medications.  Breathing appears to be at baseline.  7/18 Yet another readmission since last OV: Patient had developed pneumonia increased cough shortness of breath here today for follow-up  Patient still coughing mucus white in nature some wheezing still.  Below is copy discharge summary  12/26 Patient seen as a posthospital visit and had yet another episode of pneumonia right lower lobe and required hospitalization. Below is a copy of the discharge summary and he has actually had 3 admissions now since July of this year Post Hosp visit   3 admits since 07/2021 OV Discharge date: 12/08/21 Discharge Physician: Rickey Barbara   PCP: [name], MD    Recommendations at discharge:    1.  Follow up with PCP in 1-2 weeks 2. Follow up with Cardiology as scheduled   Discharge Diagnoses: Principal Problem:   Respiratory failure (HCC) Active Problems:   COPD exacerbation (HCC)   Malnutrition of moderate degree   Non-STEMI (non-ST elevated myocardial infarction) (HCC)   Resolved Problems:   * No resolved hospital problems. *   Hospital Course: 62 year old gentleman from home, has history of COPD and chronic hypoxemic respiratory failure on 2 L oxygen at home.  He does have a history of type 2 diabetes, hypertension.  He was found with severe COPD exacerbation secondary to rhinovirus infection, intubated and  admitted to ICU.  Also found to have SVT , not Afib  1/25 Admit, intubated, started ABx, start cardizem gtt-->got hypotensive. Converted to SR but started on Neo.   11/26 Overnight transferred from [facility] ED to [facility] ICU  11/27 Remains intubated on Prop/Versed for sedation; increased secretions with high peak pressures on vent, Resp Cx sent.  11/28 Resp Cx with few gram + cocci in pairs. FiO2 needs down to 50%. Secretions improved, but ongoing breath stacking. Sedation to Precedex. Tolerating PSV. Extubated. Hypertensive, started on cleviprex + precedex  Cardiology following and diastolic CHF.  Ischemic evaluation with left heart cath planned for 12/4.   Assessment and Plan: Acute on chronic hypoxemic and hypercapnic respiratory failure: Acute COPD exacerbation due to rhinovirus infection as well as superimposed bacterial pneumonia: On baseline 2 L oxygen at home.  Extubated to 2 L of oxygen. Remained stable on transfer to floor Continued Brovana, Pulmicort and Yupelri.  On as needed bronchodilator therapy. Solu-Medrol, tapered off to prednisone Completed broad spec abx   Essential hypertension with hypertensive emergency: Initially hypotensive after intubation on vasopressors.  Then hypertensive.  Patient was on Cleviprex infusion. Currently blood pressure stabilized on blood pressure and Cozaar.  Given lasix   Recurrent SVT:  Echo with ejection fraction 50 to 55%.  Cardiology following.  Currently in sinus rhythm.  On metoprolol, aspirin.  Improved.   Type 2 diabetes poor control, steroid-induced hyperglycemia: Fairly stable.  Not on treatment at home.  Elevated troponins with T wave depression: Respiratory status improving.  Cardiology had been following. Underwent clear heart cath 12/4. Cardiology recs for toprol xl 50mg . No need for ASA. Cardiology to f/u in 4 weeks   Clinically improving.   -Continue to work with PT OT.   -PT recommended SNF but patient reportedly felt like he can  go home.   -Eager to go home   Rash -pruritic rash in gluteal region, groin, and also over L forarm (areas pt is scratching) -Rash appears fungal -continue topical antifungal   Patient still has rash in the groin area and on buttocks.  He was given topical antifungal only.  He still coughing up yellow mucus still doing some wheezing.  He needs a follow-up chest x-ray.  He has completed his course of antibiotics is now down to the 10 mg daily prednisone.  Blood pressure is elevated on arrival 157/96.  He is now on metoprolol and low-dose losartan for blood pressure.  Still maintains the oral glipizide for diabetes.  He no longer has any home care.  He is back in the original housing has had before.  He does not drink alcohol is not smoking cigarettes.  He is chewing tobacco.  He is having trouble with swallowing and choking with this.  He needs a follow-up eye exam and needs his foot examined today.  Patient also notes vision changes left eye  03/02/22 Patient seen in return follow-up has not been back to the hospital except for a brief emergency room visit end of December.  Patient COPD is at baseline.  He just saw pulmonary medicine has an FEV1 of 40% predicted and a very low diffusion capacity due to centrilobular emphysema.  He is compliant with the [place] he.  He is using oxygen at bedtime as needed during the day.  On arrival saturation 94% room air.  Patient weight is up and is a BMI 31.6.  Blood sugar on arrival 164.  Patient takes the glipizide daily.  He is not on lipids therapy needs to be started.  Blood pressure on arrival is good 128/60.  Patient is not smoking or drinking alcohol.  He needs an A1c and other labs rechecked at this visit.  There are no other complaints.  06/08/22 Saw newlin recently 05/2022 Expand All Collapse All     Subjective: Patient ID: James [name], male    DOB: 12/29/1960  Age: 62 y.o. MRN: [id number]   CC: Hospitalization Follow-up     HPI James [name] is a 62 y.o. year old male patient of Dr. Delford Field with a history of hypertension, COPD, chronic respiratory failure (on 2L of oxygen), lung cancer (diagnosed 10 years ago)   Interval History:   He was seen at the ED for COPD exacerbation 1 week ago.   CXR revealed: IMPRESSION: Bilateral lower lung patchy opacities, which may reflect atelectasis, aspiration, or pneumonia.   Treated with Solu-Medrol, albuterol, Atrovent, Augmentin. He has completed his course of Augmentin but takes a daily dose of Prednisone. States he is back to baseline and remains adherent with his Breztri and albuterol MDI.   He is adherent with his antihypertensive and is doing well on glipizide.  Also doing well on his statin and his antihypertensive. He denies presence of additional concerns today.  Past Medical History: Diagnosis Date  Acute on chronic respiratory failure with hypoxia (HCC) 06/08/2013  Acute on chronic respiratory failure with hypoxia and hypercapnia (HCC) 06/08/2013  Alcohol use disorder 11/23/2018  CAP (community acquired pneumonia) 05/17/2018   See admit 05/17/18 ? rml  with   covid pcr neg - rx augmentin > f/u cxr in 4-6 weeks is fine unless condition declines   Community acquired pneumonia 07/12/2021  Community acquired pneumonia of right lower lobe of lung 05/17/2018   See admit 05/17/18 ? 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  Recurrent pneumonia 02/20/2021  Tick bite 06/03/2018      Past Surgical  History: Procedure Laterality Date  LEFT HEART CATH AND CORONARY ANGIOGRAPHY N/A 12/07/2021   Procedure: LEFT HEART CATH AND CORONARY ANGIOGRAPHY;  Surgeon: Runell Gess, MD;  Location: [facility] INVASIVE CV LAB;  Service: Cardiovascular;  Laterality: N/A;  LUNG REMOVAL, PARTIAL      VIDEO ASSISTED THORACOSCOPY (VATS)/THOROCOTOMY Left 06/11/2013   Procedure: VIDEO ASSISTED THORACOSCOPY (VATS)/THOROCOTOMY;  Surgeon: Kerin Perna, MD;  Location: [facility] OR;  Service: Thoracic;  Laterality: Left;  VIDEO BRONCHOSCOPY N/A 06/11/2013   Procedure: VIDEO BRONCHOSCOPY;  Surgeon: Kerin Perna, MD;  Location: [facility] OR;  Service: Thoracic;  Laterality: N/A;  VIDEO BRONCHOSCOPY N/A 08/10/2018   Procedure: VIDEO BRONCHOSCOPY WITH FLUORO;  Surgeon: Lorin Glass, MD;  Location: [facility] ENDOSCOPY;  Service: Endoscopy;  Laterality: N/A;      Family History Problem Relation Age of Onset  Heart disease Father    COPD Sister        Social History    Socioeconomic History  Marital status: Divorced     Spouse name: Not on file  Number of children: Not on file  Years of education: Not on file  Highest education level: Not on file Occupational History  Occupation: Curator  Occupation: Education administrator Tobacco Use  Smoking status: Former     Packs/day: 1.50     Years: 40.00     Additional pack years: 0.00     Total pack years: 60.00     Types: Cigarettes     Quit date: 2014     Years since quitting: 10.3  Smokeless tobacco: Former     Types: Dealer Use: Never used Substance and Sexual Activity  Alcohol use: Not Currently     Alcohol/week: 28.0 standard drinks of alcohol     Types: 28 Cans of beer per week     Comment: 4 beers a night previously  Drug use: Not Currently     Types: Marijuana     Comment: daily; quit using crack and methamphetamine about 5 months ago   Sexual activity: Not Currently     Partners: Female Other Topics Concern  Not on file Social  History Narrative   Lives alone near [place]    Social Determinants of Health    Financial Resource Strain: Low Risk  (12/25/2020)   Overall Financial Resource Strain (CARDIA)    Difficulty of Paying Living Expenses: Not very hard Recent Concern: Financial Resource Strain - High Risk (10/29/2020)   Overall Financial Resource Strain (CARDIA)    Difficulty of Paying Living Expenses: Hard Food Insecurity: No Food  Insecurity (04/19/2022)   Hunger Vital Sign    Worried About Running Out of Food in the Last Year: Never true    Ran Out of Food in the Last Year: Never true Transportation Needs: Unmet Transportation Needs (04/14/2022)   PRAPARE - Transportation    Lack of Transportation (Medical): Yes    Lack of Transportation (Non-Medical): Yes Physical Activity: Inactive (12/11/2020)   Exercise Vital Sign    Days of Exercise per Week: 0 days    Minutes of Exercise per Session: 0 min Stress: No Stress Concern Present (12/25/2020)   [name] of Occupational Health - Occupational Stress Questionnaire    Feeling of Stress : Not at all Social Connections: Moderately Integrated (11/13/2020)   Social Connection and Isolation Panel [NHANES]    Frequency of Communication with Friends and Family: More than three times a week    Frequency of Social Gatherings with Friends and Family: Once a week    Attends Religious Services: 1 to 4 times per year    Active Member of [employer] or Organizations: Yes    Attends Banker Meetings: 1 to 4 times per year    Marital Status: Divorced     No Known Allergies    Outpatient Medications Prior to Visit Medication Sig Dispense Refill  albuterol (PROVENTIL) (2.5 MG/3ML) 0.083% nebulizer solution Take 3 mLs (2.5 mg total) by nebulization every 4 (four) hours as needed for wheezing or shortness of breath. 270 mL 1  albuterol (VENTOLIN HFA) 108 (90 Base) MCG/ACT inhaler Inhale 2 puffs into the lungs every 6 (six) hours as  needed for wheezing or shortness of breath. 54 g 1  amoxicillin-clavulanate (AUGMENTIN) 875-125 MG tablet Take 1 tablet by mouth every 12 (twelve) hours. 14 tablet 0  atorvastatin (LIPITOR) 10 MG tablet Take 1 tablet (10 mg total) by mouth daily. 90 tablet 3  Budeson-Glycopyrrol-Formoterol (BREZTRI AEROSPHERE) 160-9-4.8 MCG/ACT AERO Inhale 2 puffs into the lungs in the morning and at bedtime. 10.7 g 11  Cholecalciferol (VITAMIN D3) 25 MCG (1000 UT) tablet Take 1 tablet (1,000 Units total) by mouth daily. 90 tablet 2  Continuous Blood Gluc Receiver (FREESTYLE LIBRE 2 READER) DEVI Use to check blood sugar continuously throughout the day. 1 each 0  Continuous Blood Gluc Sensor (FREESTYLE LIBRE 2 SENSOR) MISC Use to check blood sugar continuously throughout the day. 2 each 6  furosemide (LASIX) 40 MG tablet Take 1 tablet (40 mg total) by mouth daily. 90 tablet 1  glipiZIDE (GLUCOTROL XL) 5 MG 24 hr tablet Take 1 tablet (5 mg total) by mouth daily with breakfast. 90 tablet 1  losartan (COZAAR) 100 MG tablet Take 1 tablet (100 mg total) by mouth daily. 90 tablet 2  metoprolol succinate (TOPROL-XL) 50 MG 24 hr tablet Take 1 tablet (50 mg total) by mouth daily. Take with or immediately following a meal. 90 tablet 2  nystatin cream (MYCOSTATIN) Apply to affected areas 3 (three) times daily. 30 g 2  pantoprazole (PROTONIX) 40 MG tablet Take 1 tablet (40 mg total) by mouth daily. 90 tablet 6  predniSONE (DELTASONE) 10 MG tablet Take 1 tablet (10 mg total) by mouth daily. 90 tablet 6  pregabalin (LYRICA) 100 MG capsule Take 1 capsule (100 mg total) by mouth 2 (two) times daily. 60 capsule 2    No facility-administered medications prior to visit.       ROS Review of Systems  Constitutional:  Negative for activity change and appetite change.  HENT:  Negative for  sinus pressure and sore throat.   Respiratory:  Negative for chest tightness, shortness of breath and wheezing.   Cardiovascular:   Negative for chest pain and palpitations.  Gastrointestinal:  Negative for abdominal distention, abdominal pain and constipation.  Genitourinary: Negative.   Musculoskeletal: Negative.   Psychiatric/Behavioral:  Negative for behavioral problems and dysphoric mood.        Objective: BP 131/75   Pulse 92   Ht 5\' 9"  (1.753 m)   Wt 207 lb 6.4 oz (94.1 kg)   SpO2 99%   BMI 30.63 kg/m         05/10/2022   10:00 AM 05/03/2022    1:30 PM 05/03/2022    1:00 PM BP/Weight Systolic BP 131 120 123 Diastolic BP 75 80 73 Wt. (Lbs) 207.4     BMI 30.63 kg/m2             Physical Exam Constitutional:      Appearance: He is well-developed.  HENT:     Nose:     Comments: 2 L oxygen via nasal cannula Cardiovascular:     Rate and Rhythm: Normal rate.     Heart sounds: Normal heart sounds. No murmur heard. Pulmonary:     Effort: Pulmonary effort is normal.     Breath sounds: Wheezing present. No rales.  Chest:     Chest wall: No tenderness.  Abdominal:     General: Bowel sounds are normal. There is no distension.     Palpations: Abdomen is soft. There is no mass.     Tenderness: There is no abdominal tenderness.  Musculoskeletal:        General: Normal range of motion.     Right lower leg: No edema.     Left lower leg: No edema.  Neurological:     Mental Status: He is alert and oriented to person, place, and time.  Psychiatric:        Mood and Affect: Mood normal.             Latest Ref Rng & Units 05/03/2022    8:45 AM 03/02/2022    9:12 AM 12/31/2021   10:02 AM CMP Glucose 70 - 99 mg/dL 098  119  147  BUN 6 - 20 mg/dL 20  18  8   Creatinine 0.61 - 1.24 mg/dL 8.29  5.62  1.30  Sodium 135 - 145 mmol/L 140  147  143  Potassium 3.5 - 5.1 mmol/L 4.5  4.5  3.0  Chloride 98 - 111 mmol/L 99  105  109  CO2 22 - 32 mmol/L 28  25  26   Calcium 8.9 - 10.3 mg/dL 9.2  9.3  7.5      Lipid Panel   Labs (Brief)   Component Value Date/Time   CHOL 158 03/02/2022 0912    TRIG 140 03/02/2022 0912   HDL 52 03/02/2022 0912   CHOLHDL 3.0 03/02/2022 0912   LDLCALC 82 03/02/2022 0912      CBC  Labs (Brief)   Component Value Date/Time   WBC 15.0 (H) 05/03/2022 0845   RBC 4.10 (L) 05/03/2022 0845   HGB 12.1 (L) 05/03/2022 0845   HGB 12.6 (L) 12/29/2021 1020   HCT 37.4 (L) 05/03/2022 0845   HCT 39.2 12/29/2021 1020   PLT 271 05/03/2022 0845   PLT 306 12/29/2021 1020   MCV 91.2 05/03/2022 0845   MCV 89 12/29/2021 1020   MCH 29.5 05/03/2022 0845   MCHC 32.4 05/03/2022 0845  RDW 14.1 05/03/2022 0845   RDW 12.9 12/29/2021 1020   LYMPHSABS 0.7 05/03/2022 0845   LYMPHSABS 1.3 12/29/2021 1020   MONOABS 0.7 05/03/2022 0845   EOSABS 0.1 05/03/2022 0845   EOSABS 0.3 12/29/2021 1020   BASOSABS 0.0 05/03/2022 0845   BASOSABS 0.1 12/29/2021 1020       Recent Labs Lab Results Component Value Date   HGBA1C 6.4 (H) 03/02/2022       Assessment & Plan: 1. COPD with chronic bronchitis Acute exacerbation has resolved Continue course of Augmentin until completed Continue daily dose of prednisone, Breztri and albuterol MDI   2. Type 2 diabetes mellitus without complication, without long-term current use of insulin (HCC) Controlled with A1c of 6.4 Continue glipizide Counseled on Diabetic diet, my plate method, 161 minutes of moderate intensity exercise/week Blood sugar logs with fasting goals of 80-120 mg/dl, random of less than 096 and in the event of sugars less than 60 mg/dl or greater than 045 mg/dl encouraged to notify the clinic. Advised on the need for annual eye exams, annual foot exams, Pneumonia vaccine.     3. Chronic respiratory failure with hypoxia (HCC) Currently at baseline Continue 2 L oxygen   4. Primary hypertension Controlled Continue metoprolol, losartan Counseled on blood pressure goal of less than 130/80, low-sodium, DASH diet, medication compliance, 150 minutes of moderate intensity exercise per week. Discussed medication  compliance, adverse effects.        Past medical history reviewed no changes made  Review of Systems  Constitutional:  Negative for chills, diaphoresis, fever, malaise/fatigue and weight loss.  HENT:  Negative for congestion, hearing loss, nosebleeds, sore throat and tinnitus.   Eyes:  Negative for blurred vision, photophobia and redness.  Respiratory:  Positive for shortness of breath. Negative for cough, hemoptysis, sputum production, wheezing and stridor.        White  Cardiovascular:  Negative for chest pain, palpitations, orthopnea, claudication, leg swelling and PND.  Gastrointestinal:  Negative for abdominal pain, blood in stool, constipation, diarrhea, heartburn, nausea and vomiting.  Genitourinary:  Negative for dysuria, flank pain, frequency, hematuria and urgency.  Musculoskeletal:  Negative for back pain, falls, joint pain, myalgias and neck pain.  Skin:  Negative for itching and rash.  Neurological:  Negative for dizziness, tingling, tremors, sensory change, speech change, focal weakness, seizures, loss of consciousness, weakness and headaches.  Endo/Heme/Allergies:  Negative for environmental allergies and polydipsia. Does not bruise/bleed easily.  Psychiatric/Behavioral:  Negative for depression, memory loss, substance abuse and suicidal ideas. The patient is not nervous/anxious and does not have insomnia.       Objective:     There were no vitals taken for this visit.   Physical Exam Vitals reviewed.  Constitutional:      Appearance: Normal appearance. He is well-developed. He is obese. He is not diaphoretic.  HENT:     Head: Normocephalic and atraumatic.     Nose: No nasal deformity, septal deviation, mucosal edema or rhinorrhea.     Right Sinus: No maxillary sinus tenderness or frontal sinus tenderness.     Left Sinus: No maxillary sinus tenderness or frontal sinus tenderness.     Mouth/Throat:     Pharynx: No oropharyngeal exudate.  Eyes:     General: No  scleral icterus.    Conjunctiva/sclera: Conjunctivae normal.     Pupils: Pupils are equal, round, and reactive to light.  Neck:     Thyroid: No thyromegaly.     Vascular: No carotid bruit or  JVD.     Trachea: Trachea normal. No tracheal tenderness or tracheal deviation.  Cardiovascular:     Rate and Rhythm: Normal rate and regular rhythm.     Chest Wall: PMI is not displaced.     Pulses: Normal pulses. No decreased pulses.     Heart sounds: Normal heart sounds, S1 normal and S2 normal. Heart sounds not distant. No murmur heard.    No systolic murmur is present.     No diastolic murmur is present.     No friction rub. No gallop. No S3 or S4 sounds.  Pulmonary:     Effort: Pulmonary effort is normal. No tachypnea, accessory muscle usage or respiratory distress.     Breath sounds: No stridor. No decreased breath sounds, wheezing, rhonchi or rales.     Comments: Distant breath sounds Chest:     Chest wall: No tenderness.  Abdominal:     General: Bowel sounds are normal. There is no distension.     Palpations: Abdomen is soft. Abdomen is not rigid.     Tenderness: There is no abdominal tenderness. There is no guarding or rebound.  Musculoskeletal:        General: Normal range of motion.     Cervical back: Normal range of motion and neck supple. No edema, erythema or rigidity. No muscular tenderness. Normal range of motion.  Lymphadenopathy:     Head:     Right side of head: No submental or submandibular adenopathy.     Left side of head: No submental or submandibular adenopathy.     Cervical: No cervical adenopathy.  Skin:    General: Skin is warm and dry.     Coloration: Skin is not pale.     Findings: No rash.     Nails: There is no clubbing.     Comments: Erythematous rash both forearms is improved  Groin rash compatible with tinea cruris  Bilateral onychomycosis both feet  Neurological:     Mental Status: He is alert and oriented to person, place, and time.     Sensory: No  sensory deficit.  Psychiatric:        Speech: Speech normal.        Behavior: Behavior normal.      No results found for any visits on 06/09/22.      The ASCVD Risk score (Arnett DK, et al., 2019) failed to calculate for the following reasons:   The patient has a prior MI or stroke diagnosis    Assessment & Plan:   Problem List Items Addressed This Visit   None No follow-ups on file.    [name], MD

## 2022-06-09 ENCOUNTER — Ambulatory Visit: Payer: Medicaid Other | Attending: Critical Care Medicine | Admitting: Critical Care Medicine

## 2022-06-09 ENCOUNTER — Encounter: Payer: Self-pay | Admitting: Critical Care Medicine

## 2022-06-09 ENCOUNTER — Other Ambulatory Visit: Payer: Self-pay

## 2022-06-09 ENCOUNTER — Telehealth: Payer: Self-pay | Admitting: Pharmacist

## 2022-06-09 VITALS — BP 127/84 | HR 83 | Wt 220.8 lb

## 2022-06-09 DIAGNOSIS — G629 Polyneuropathy, unspecified: Secondary | ICD-10-CM | POA: Diagnosis not present

## 2022-06-09 DIAGNOSIS — J4489 Other specified chronic obstructive pulmonary disease: Secondary | ICD-10-CM

## 2022-06-09 DIAGNOSIS — R1312 Dysphagia, oropharyngeal phase: Secondary | ICD-10-CM | POA: Diagnosis not present

## 2022-06-09 DIAGNOSIS — B356 Tinea cruris: Secondary | ICD-10-CM

## 2022-06-09 DIAGNOSIS — E119 Type 2 diabetes mellitus without complications: Secondary | ICD-10-CM

## 2022-06-09 DIAGNOSIS — I10 Essential (primary) hypertension: Secondary | ICD-10-CM

## 2022-06-09 DIAGNOSIS — L989 Disorder of the skin and subcutaneous tissue, unspecified: Secondary | ICD-10-CM | POA: Diagnosis not present

## 2022-06-09 DIAGNOSIS — C3491 Malignant neoplasm of unspecified part of right bronchus or lung: Secondary | ICD-10-CM | POA: Diagnosis not present

## 2022-06-09 DIAGNOSIS — J9611 Chronic respiratory failure with hypoxia: Secondary | ICD-10-CM | POA: Diagnosis not present

## 2022-06-09 DIAGNOSIS — E669 Obesity, unspecified: Secondary | ICD-10-CM

## 2022-06-09 DIAGNOSIS — Z6832 Body mass index (BMI) 32.0-32.9, adult: Secondary | ICD-10-CM | POA: Diagnosis not present

## 2022-06-09 DIAGNOSIS — Z7984 Long term (current) use of oral hypoglycemic drugs: Secondary | ICD-10-CM | POA: Diagnosis not present

## 2022-06-09 LAB — GLUCOSE, POCT (MANUAL RESULT ENTRY): POC Glucose: 135 mg/dl — AB (ref 70–99)

## 2022-06-09 MED ORDER — NYSTATIN 100000 UNIT/GM EX CREA
TOPICAL_CREAM | Freq: Three times a day (TID) | CUTANEOUS | 2 refills | Status: DC
  Filled 2022-06-09: qty 30, 10d supply, fill #0
  Filled 2022-06-09: qty 30, 20d supply, fill #0
  Filled 2022-06-23: qty 30, 30d supply, fill #1
  Filled 2022-06-30: qty 30, 20d supply, fill #1
  Filled 2022-06-30: qty 30, 15d supply, fill #1
  Filled 2022-07-19: qty 30, 20d supply, fill #2

## 2022-06-09 MED ORDER — FUROSEMIDE 40 MG PO TABS
40.0000 mg | ORAL_TABLET | Freq: Every day | ORAL | 1 refills | Status: DC
  Filled 2022-06-09: qty 90, 90d supply, fill #0

## 2022-06-09 MED ORDER — PREDNISONE 10 MG PO TABS
10.0000 mg | ORAL_TABLET | Freq: Every day | ORAL | 6 refills | Status: DC
  Filled 2022-06-09: qty 90, 90d supply, fill #0
  Filled 2022-06-23 (×2): qty 30, 30d supply, fill #0

## 2022-06-09 MED ORDER — PREGABALIN 100 MG PO CAPS
100.0000 mg | ORAL_CAPSULE | Freq: Two times a day (BID) | ORAL | 2 refills | Status: DC
  Filled 2022-06-09 (×2): qty 60, 30d supply, fill #0
  Filled 2022-09-27 (×2): qty 60, 30d supply, fill #1
  Filled ????-??-??: fill #1

## 2022-06-09 MED ORDER — GLIPIZIDE ER 5 MG PO TB24
5.0000 mg | ORAL_TABLET | Freq: Every day | ORAL | 1 refills | Status: DC
  Filled 2022-06-09 – 2022-08-24 (×2): qty 90, 90d supply, fill #0

## 2022-06-09 MED ORDER — LOSARTAN POTASSIUM 100 MG PO TABS
100.0000 mg | ORAL_TABLET | Freq: Every day | ORAL | 2 refills | Status: DC
  Filled 2022-06-09 – 2022-10-25 (×3): qty 90, 90d supply, fill #0
  Filled 2022-10-25 – 2023-01-28 (×2): qty 90, 90d supply, fill #1

## 2022-06-09 MED ORDER — METOPROLOL SUCCINATE ER 50 MG PO TB24
50.0000 mg | ORAL_TABLET | Freq: Every day | ORAL | 2 refills | Status: DC
  Filled 2022-06-09 – 2022-12-01 (×3): qty 90, 90d supply, fill #0
  Filled 2022-12-01 – 2023-03-15 (×2): qty 90, 90d supply, fill #1

## 2022-06-09 MED ORDER — PANTOPRAZOLE SODIUM 40 MG PO TBEC
40.0000 mg | DELAYED_RELEASE_TABLET | Freq: Every day | ORAL | 6 refills | Status: DC
  Filled 2022-06-09: qty 90, 90d supply, fill #0

## 2022-06-09 NOTE — Assessment & Plan Note (Signed)
Under control refill glucose meter continue with glipizide

## 2022-06-09 NOTE — Patient Instructions (Addendum)
No medication changes Refills sent to pharmacy Return to Dr Delford Field 4 months  Lab work today  Skin doctor referral made

## 2022-06-09 NOTE — Assessment & Plan Note (Signed)
Refill nystatin. 

## 2022-06-09 NOTE — Assessment & Plan Note (Signed)
Continue with Lyrica

## 2022-06-09 NOTE — Assessment & Plan Note (Signed)
Oxygen sats are good this visit continue nocturnal oxygen as needed daytime

## 2022-06-09 NOTE — Assessment & Plan Note (Signed)
Controlled; continue current medicines 

## 2022-06-09 NOTE — Assessment & Plan Note (Signed)
Has follow-up with GI for dilation procedure in September

## 2022-06-09 NOTE — Assessment & Plan Note (Signed)
Follows with pulmonary 

## 2022-06-09 NOTE — Assessment & Plan Note (Signed)
Continue inhaled medications patient is at baseline

## 2022-06-09 NOTE — Telephone Encounter (Signed)
noted 

## 2022-06-09 NOTE — Telephone Encounter (Signed)
Hey friends,   We are trying to appeal a PA denial to get this patient coverage for CGM. His appeal process is requesting a letter of medical necessity. If possible, can you write a letter of medical necessity for this patient? Even though he is not insulin-dependent, I believe his dexterity problems and hand shaking could help him quality. If you are able, you can send me the letter with SECURE in the heading to my work email. I'll then be able to share this with Tresa Endo downstairs to fax to his insurance.   Let me know your thoughts.

## 2022-06-10 LAB — MICROALBUMIN / CREATININE URINE RATIO
Creatinine, Urine: 14 mg/dL
Microalb/Creat Ratio: <21 mg/g creat (ref 0–29)
Microalbumin, Urine: <3 ug/mL

## 2022-06-10 LAB — RENAL FUNCTION PANEL
Albumin: 4.5 g/dL (ref 3.9–4.9)
BUN/Creatinine Ratio: 24 (ref 10–24)
BUN: 26 mg/dL (ref 8–27)
CO2: 25 mmol/L (ref 20–29)
Calcium: 10.1 mg/dL (ref 8.6–10.2)
Chloride: 102 mmol/L (ref 96–106)
Creatinine, Ser: 1.09 mg/dL (ref 0.76–1.27)
Glucose: 114 mg/dL — ABNORMAL HIGH (ref 70–99)
Phosphorus: 3.8 mg/dL (ref 2.8–4.1)
Potassium: 5 mmol/L (ref 3.5–5.2)
Sodium: 142 mmol/L (ref 134–144)
eGFR: 77 mL/min/{1.73_m2} (ref >59)

## 2022-06-10 NOTE — Progress Notes (Signed)
Let pt know urine normal no kidney damage from diabetes

## 2022-06-10 NOTE — Progress Notes (Signed)
Let pt know kidney normal

## 2022-06-11 ENCOUNTER — Other Ambulatory Visit: Payer: Self-pay

## 2022-06-14 ENCOUNTER — Inpatient Hospital Stay (HOSPITAL_COMMUNITY)
Admission: EM | Admit: 2022-06-14 | Discharge: 2022-06-21 | DRG: 189 | Disposition: A | Discharge status: Home / Self Care | Payer: Medicaid Other | Attending: Family Medicine | Admitting: Family Medicine

## 2022-06-14 ENCOUNTER — Other Ambulatory Visit: Payer: Self-pay

## 2022-06-14 ENCOUNTER — Encounter (HOSPITAL_COMMUNITY): Payer: Self-pay

## 2022-06-14 ENCOUNTER — Emergency Department (HOSPITAL_COMMUNITY): Payer: Medicaid Other

## 2022-06-14 DIAGNOSIS — J9621 Acute and chronic respiratory failure with hypoxia: Principal | ICD-10-CM | POA: Diagnosis present

## 2022-06-14 DIAGNOSIS — J441 Chronic obstructive pulmonary disease with (acute) exacerbation: Secondary | ICD-10-CM | POA: Diagnosis not present

## 2022-06-14 DIAGNOSIS — Z7951 Long term (current) use of inhaled steroids: Secondary | ICD-10-CM | POA: Diagnosis not present

## 2022-06-14 DIAGNOSIS — Z902 Acquired absence of lung [part of]: Secondary | ICD-10-CM

## 2022-06-14 DIAGNOSIS — Z1152 Encounter for screening for COVID-19: Secondary | ICD-10-CM

## 2022-06-14 DIAGNOSIS — Z825 Family history of asthma and other chronic lower respiratory diseases: Secondary | ICD-10-CM

## 2022-06-14 DIAGNOSIS — E1142 Type 2 diabetes mellitus with diabetic polyneuropathy: Secondary | ICD-10-CM | POA: Diagnosis present

## 2022-06-14 DIAGNOSIS — Z9981 Dependence on supplemental oxygen: Secondary | ICD-10-CM | POA: Diagnosis not present

## 2022-06-14 DIAGNOSIS — J449 Chronic obstructive pulmonary disease, unspecified: Secondary | ICD-10-CM | POA: Diagnosis present

## 2022-06-14 DIAGNOSIS — F17220 Nicotine dependence, chewing tobacco, uncomplicated: Secondary | ICD-10-CM | POA: Diagnosis not present

## 2022-06-14 DIAGNOSIS — Z8616 Personal history of COVID-19: Secondary | ICD-10-CM | POA: Diagnosis not present

## 2022-06-14 DIAGNOSIS — F1011 Alcohol abuse, in remission: Secondary | ICD-10-CM | POA: Diagnosis present

## 2022-06-14 DIAGNOSIS — I252 Old myocardial infarction: Secondary | ICD-10-CM | POA: Diagnosis not present

## 2022-06-14 DIAGNOSIS — R0689 Other abnormalities of breathing: Secondary | ICD-10-CM | POA: Diagnosis not present

## 2022-06-14 DIAGNOSIS — Z7984 Long term (current) use of oral hypoglycemic drugs: Secondary | ICD-10-CM

## 2022-06-14 DIAGNOSIS — N179 Acute kidney failure, unspecified: Secondary | ICD-10-CM | POA: Diagnosis not present

## 2022-06-14 DIAGNOSIS — Z7952 Long term (current) use of systemic steroids: Secondary | ICD-10-CM | POA: Diagnosis not present

## 2022-06-14 DIAGNOSIS — K219 Gastro-esophageal reflux disease without esophagitis: Secondary | ICD-10-CM | POA: Diagnosis not present

## 2022-06-14 DIAGNOSIS — R918 Other nonspecific abnormal finding of lung field: Secondary | ICD-10-CM | POA: Diagnosis present

## 2022-06-14 DIAGNOSIS — Z8249 Family history of ischemic heart disease and other diseases of the circulatory system: Secondary | ICD-10-CM | POA: Diagnosis not present

## 2022-06-14 DIAGNOSIS — I10 Essential (primary) hypertension: Secondary | ICD-10-CM | POA: Diagnosis not present

## 2022-06-14 DIAGNOSIS — E669 Obesity, unspecified: Secondary | ICD-10-CM | POA: Diagnosis present

## 2022-06-14 DIAGNOSIS — J9601 Acute respiratory failure with hypoxia: Secondary | ICD-10-CM | POA: Diagnosis not present

## 2022-06-14 DIAGNOSIS — C3491 Malignant neoplasm of unspecified part of right bronchus or lung: Secondary | ICD-10-CM | POA: Diagnosis present

## 2022-06-14 DIAGNOSIS — E119 Type 2 diabetes mellitus without complications: Secondary | ICD-10-CM | POA: Diagnosis not present

## 2022-06-14 DIAGNOSIS — R0602 Shortness of breath: Secondary | ICD-10-CM | POA: Diagnosis not present

## 2022-06-14 DIAGNOSIS — Z6832 Body mass index (BMI) 32.0-32.9, adult: Secondary | ICD-10-CM | POA: Diagnosis not present

## 2022-06-14 DIAGNOSIS — R0902 Hypoxemia: Secondary | ICD-10-CM | POA: Diagnosis not present

## 2022-06-14 DIAGNOSIS — J9622 Acute and chronic respiratory failure with hypercapnia: Secondary | ICD-10-CM | POA: Diagnosis not present

## 2022-06-14 DIAGNOSIS — F79 Unspecified intellectual disabilities: Secondary | ICD-10-CM | POA: Diagnosis not present

## 2022-06-14 DIAGNOSIS — Z79899 Other long term (current) drug therapy: Secondary | ICD-10-CM | POA: Diagnosis not present

## 2022-06-14 DIAGNOSIS — R0603 Acute respiratory distress: Secondary | ICD-10-CM | POA: Diagnosis not present

## 2022-06-14 DIAGNOSIS — E8729 Other acidosis: Secondary | ICD-10-CM | POA: Diagnosis present

## 2022-06-14 DIAGNOSIS — R062 Wheezing: Secondary | ICD-10-CM | POA: Diagnosis not present

## 2022-06-14 LAB — I-STAT VENOUS BLOOD GAS, ED
Acid-base deficit: 2 mmol/L (ref 0.0–2.0)
Bicarbonate: 27.3 mmol/L (ref 20.0–28.0)
Calcium, Ion: 1.22 mmol/L (ref 1.15–1.40)
HCT: 41 % (ref 39.0–52.0)
Hemoglobin: 13.9 g/dL (ref 13.0–17.0)
O2 Saturation: 71 %
Patient temperature: 98.6
Potassium: 4.3 mmol/L (ref 3.5–5.1)
Sodium: 135 mmol/L (ref 135–145)
TCO2: 29 mmol/L (ref 22–32)
pCO2, Ven: 69.1 mmHg — ABNORMAL HIGH (ref 44–60)
pH, Ven: 7.205 — ABNORMAL LOW (ref 7.25–7.43)
pO2, Ven: 47 mmHg — ABNORMAL HIGH (ref 32–45)

## 2022-06-14 LAB — I-STAT ARTERIAL BLOOD GAS, ED
Acid-base deficit: 2 mmol/L (ref 0.0–2.0)
Bicarbonate: 28.2 mmol/L — ABNORMAL HIGH (ref 20.0–28.0)
Calcium, Ion: 1.21 mmol/L (ref 1.15–1.40)
HCT: 41 % (ref 39.0–52.0)
Hemoglobin: 13.9 g/dL (ref 13.0–17.0)
O2 Saturation: 96 %
Patient temperature: 98.2
Potassium: 4.3 mmol/L (ref 3.5–5.1)
Sodium: 134 mmol/L — ABNORMAL LOW (ref 135–145)
TCO2: 30 mmol/L (ref 22–32)
pCO2 arterial: 73.1 mmHg (ref 32–48)
pH, Arterial: 7.193 — CL (ref 7.35–7.45)
pO2, Arterial: 104 mmHg (ref 83–108)

## 2022-06-14 LAB — BASIC METABOLIC PANEL
Anion gap: 11 (ref 5–15)
BUN: 22 mg/dL (ref 8–23)
CO2: 25 mmol/L (ref 22–32)
Calcium: 8.7 mg/dL — ABNORMAL LOW (ref 8.9–10.3)
Chloride: 98 mmol/L (ref 98–111)
Creatinine, Ser: 1.36 mg/dL — ABNORMAL HIGH (ref 0.61–1.24)
GFR, Estimated: 59 mL/min — ABNORMAL LOW (ref >60)
Glucose, Bld: 87 mg/dL (ref 70–99)
Potassium: 4.6 mmol/L (ref 3.5–5.1)
Sodium: 134 mmol/L — ABNORMAL LOW (ref 135–145)

## 2022-06-14 LAB — SARS CORONAVIRUS 2 BY RT PCR: SARS Coronavirus 2 by RT PCR: NEGATIVE

## 2022-06-14 LAB — CBC WITH DIFFERENTIAL/PLATELET
Abs Immature Granulocytes: 0.11 10*3/uL — ABNORMAL HIGH (ref 0.00–0.07)
Basophils Absolute: 0.1 10*3/uL (ref 0.0–0.1)
Basophils Relative: 1 %
Eosinophils Absolute: 0 10*3/uL (ref 0.0–0.5)
Eosinophils Relative: 0 %
HCT: 40.9 % (ref 39.0–52.0)
Hemoglobin: 12.8 g/dL — ABNORMAL LOW (ref 13.0–17.0)
Immature Granulocytes: 1 %
Lymphocytes Relative: 22 %
Lymphs Abs: 3.1 10*3/uL (ref 0.7–4.0)
MCH: 29.5 pg (ref 26.0–34.0)
MCHC: 31.3 g/dL (ref 30.0–36.0)
MCV: 94.2 fL (ref 80.0–100.0)
Monocytes Absolute: 1.2 10*3/uL — ABNORMAL HIGH (ref 0.1–1.0)
Monocytes Relative: 9 %
Neutro Abs: 9.4 10*3/uL — ABNORMAL HIGH (ref 1.7–7.7)
Neutrophils Relative %: 67 %
Platelets: 316 10*3/uL (ref 150–400)
RBC: 4.34 MIL/uL (ref 4.22–5.81)
RDW: 14.6 % (ref 11.5–15.5)
WBC: 13.9 10*3/uL — ABNORMAL HIGH (ref 4.0–10.5)
nRBC: 0 % (ref 0.0–0.2)

## 2022-06-14 LAB — CBG MONITORING, ED
Glucose-Capillary: 240 mg/dL — ABNORMAL HIGH (ref 70–99)
Glucose-Capillary: 184 mg/dL — ABNORMAL HIGH (ref 70–99)
Glucose-Capillary: 177 mg/dL — ABNORMAL HIGH (ref 70–99)

## 2022-06-14 LAB — BRAIN NATRIURETIC PEPTIDE: B Natriuretic Peptide: 52.4 pg/mL (ref 0.0–100.0)

## 2022-06-14 LAB — BLOOD GAS, VENOUS
Acid-base deficit: 0.3 mmol/L (ref 0.0–2.0)
Bicarbonate: 27.4 mmol/L (ref 20.0–28.0)
O2 Saturation: 93.5 %
Patient temperature: 37
pCO2, Ven: 57 mmHg (ref 44–60)
pH, Ven: 7.29 (ref 7.25–7.43)
pO2, Ven: 65 mmHg — ABNORMAL HIGH (ref 32–45)

## 2022-06-14 LAB — TROPONIN I (HIGH SENSITIVITY)
Troponin I (High Sensitivity): 11 ng/L (ref <18)
Troponin I (High Sensitivity): 12 ng/L (ref <18)

## 2022-06-14 LAB — HIV ANTIBODY (ROUTINE TESTING W REFLEX): HIV Screen 4th Generation wRfx: NONREACTIVE

## 2022-06-14 LAB — HEMOGLOBIN A1C
Hgb A1c MFr Bld: 6.2 % — ABNORMAL HIGH (ref 4.8–5.6)
Mean Plasma Glucose: 131.24 mg/dL

## 2022-06-14 LAB — GLUCOSE, CAPILLARY: Glucose-Capillary: 193 mg/dL — ABNORMAL HIGH (ref 70–99)

## 2022-06-14 MED ORDER — ONDANSETRON HCL 4 MG/2ML IJ SOLN: 4.0000 mg | Freq: Four times a day (QID) | INTRAMUSCULAR | Status: DC | PRN

## 2022-06-14 MED ORDER — IPRATROPIUM-ALBUTEROL 0.5-2.5 (3) MG/3ML IN SOLN
3.0000 mL | RESPIRATORY_TRACT | Status: DC | PRN
  Administered 2022-06-15: 3 mL via RESPIRATORY_TRACT
  Filled 2022-06-14: qty 3

## 2022-06-14 MED ORDER — METOPROLOL TARTRATE 5 MG/5ML IV SOLN: 5.0000 mg | INTRAVENOUS | Status: DC | PRN

## 2022-06-14 MED ORDER — MOMETASONE FURO-FORMOTEROL FUM 200-5 MCG/ACT IN AERO
2.0000 | INHALATION_SPRAY | Freq: Two times a day (BID) | RESPIRATORY_TRACT | Status: DC
  Filled 2022-06-14: qty 8.8

## 2022-06-14 MED ORDER — FOLIC ACID 1 MG PO TABS
1.0000 mg | ORAL_TABLET | Freq: Every day | ORAL | Status: DC
  Administered 2022-06-14 – 2022-06-21 (×8): 1 mg via ORAL
  Filled 2022-06-14 (×8): qty 1

## 2022-06-14 MED ORDER — LORAZEPAM 2 MG/ML IJ SOLN: 1.0000 mg | INTRAMUSCULAR | Status: AC | PRN

## 2022-06-14 MED ORDER — ENOXAPARIN SODIUM 40 MG/0.4ML IJ SOSY
40.0000 mg | PREFILLED_SYRINGE | INTRAMUSCULAR | Status: DC
  Administered 2022-06-14 – 2022-06-20 (×6): 40 mg via SUBCUTANEOUS
  Filled 2022-06-14 (×6): qty 0.4

## 2022-06-14 MED ORDER — METHYLPREDNISOLONE SODIUM SUCC 40 MG IJ SOLR
40.0000 mg | Freq: Two times a day (BID) | INTRAMUSCULAR | Status: DC
  Administered 2022-06-14 – 2022-06-18 (×9): 40 mg via INTRAVENOUS
  Filled 2022-06-14 (×9): qty 1

## 2022-06-14 MED ORDER — THIAMINE MONONITRATE 100 MG PO TABS
100.0000 mg | ORAL_TABLET | Freq: Every day | ORAL | Status: DC
  Administered 2022-06-14 – 2022-06-21 (×7): 100 mg via ORAL
  Filled 2022-06-14 (×8): qty 1

## 2022-06-14 MED ORDER — THIAMINE HCL 100 MG/ML IJ SOLN
100.0000 mg | Freq: Every day | INTRAMUSCULAR | Status: DC
  Administered 2022-06-17: 100 mg via INTRAVENOUS
  Filled 2022-06-14 (×3): qty 2

## 2022-06-14 MED ORDER — ALBUTEROL SULFATE (2.5 MG/3ML) 0.083% IN NEBU
10.0000 mg/h | INHALATION_SOLUTION | Freq: Once | RESPIRATORY_TRACT | Status: AC
  Administered 2022-06-14: 10 mg/h via RESPIRATORY_TRACT
  Filled 2022-06-14: qty 12

## 2022-06-14 MED ORDER — UMECLIDINIUM BROMIDE 62.5 MCG/ACT IN AEPB
1.0000 | INHALATION_SPRAY | Freq: Every day | RESPIRATORY_TRACT | Status: DC
  Filled 2022-06-14: qty 7

## 2022-06-14 MED ORDER — INSULIN ASPART 100 UNIT/ML IJ SOLN
0.0000 [IU] | Freq: Three times a day (TID) | INTRAMUSCULAR | Status: DC
  Administered 2022-06-15 – 2022-06-16 (×5): 3 [IU] via SUBCUTANEOUS
  Administered 2022-06-16: 2 [IU] via SUBCUTANEOUS
  Administered 2022-06-17: 3 [IU] via SUBCUTANEOUS
  Administered 2022-06-17: 5 [IU] via SUBCUTANEOUS
  Administered 2022-06-17 – 2022-06-18 (×3): 3 [IU] via SUBCUTANEOUS
  Administered 2022-06-19: 2 [IU] via SUBCUTANEOUS
  Administered 2022-06-19: 3 [IU] via SUBCUTANEOUS
  Administered 2022-06-19: 8 [IU] via SUBCUTANEOUS
  Administered 2022-06-20 (×2): 5 [IU] via SUBCUTANEOUS
  Administered 2022-06-20: 2 [IU] via SUBCUTANEOUS
  Administered 2022-06-21: 3 [IU] via SUBCUTANEOUS

## 2022-06-14 MED ORDER — SODIUM CHLORIDE 0.9 % IV SOLN
1.0000 g | Freq: Every day | INTRAVENOUS | Status: DC
  Administered 2022-06-14 – 2022-06-15 (×2): 1 g via INTRAVENOUS
  Filled 2022-06-14 (×2): qty 10

## 2022-06-14 MED ORDER — IPRATROPIUM-ALBUTEROL 0.5-2.5 (3) MG/3ML IN SOLN
3.0000 mL | Freq: Two times a day (BID) | RESPIRATORY_TRACT | Status: DC
  Administered 2022-06-15: 3 mL via RESPIRATORY_TRACT
  Filled 2022-06-14: qty 3

## 2022-06-14 MED ORDER — DEXTROSE-SODIUM CHLORIDE 5-0.45 % IV SOLN: INTRAVENOUS | Status: DC

## 2022-06-14 MED ORDER — GUAIFENESIN 100 MG/5ML PO LIQD: 5.0000 mL | ORAL | Status: DC | PRN

## 2022-06-14 MED ORDER — BUDESONIDE 0.5 MG/2ML IN SUSP
0.5000 mg | Freq: Two times a day (BID) | RESPIRATORY_TRACT | Status: DC
  Administered 2022-06-15 – 2022-06-21 (×13): 0.5 mg via RESPIRATORY_TRACT
  Filled 2022-06-14 (×13): qty 2

## 2022-06-14 MED ORDER — INSULIN ASPART 100 UNIT/ML IJ SOLN
0.0000 [IU] | Freq: Four times a day (QID) | INTRAMUSCULAR | Status: DC
  Administered 2022-06-14: 5 [IU] via SUBCUTANEOUS
  Administered 2022-06-14: 3 [IU] via SUBCUTANEOUS

## 2022-06-14 MED ORDER — IPRATROPIUM-ALBUTEROL 0.5-2.5 (3) MG/3ML IN SOLN
3.0000 mL | Freq: Four times a day (QID) | RESPIRATORY_TRACT | Status: DC
  Administered 2022-06-14 (×2): 3 mL via RESPIRATORY_TRACT
  Filled 2022-06-14 (×2): qty 3

## 2022-06-14 MED ORDER — CHLORDIAZEPOXIDE HCL 5 MG PO CAPS
25.0000 mg | ORAL_CAPSULE | Freq: Every day | ORAL | Status: AC
  Administered 2022-06-16: 25 mg via ORAL
  Filled 2022-06-14: qty 5

## 2022-06-14 MED ORDER — ADULT MULTIVITAMIN W/MINERALS CH
1.0000 | ORAL_TABLET | Freq: Every day | ORAL | Status: DC
  Administered 2022-06-14 – 2022-06-21 (×8): 1 via ORAL
  Filled 2022-06-14 (×8): qty 1

## 2022-06-14 MED ORDER — HYDRALAZINE HCL 20 MG/ML IJ SOLN: 10.0000 mg | INTRAMUSCULAR | Status: DC | PRN

## 2022-06-14 MED ORDER — LORAZEPAM 1 MG PO TABS: 1.0000 mg | ORAL_TABLET | ORAL | Status: AC | PRN

## 2022-06-14 MED ORDER — SENNOSIDES-DOCUSATE SODIUM 8.6-50 MG PO TABS: 1.0000 | ORAL_TABLET | Freq: Every evening | ORAL | Status: DC | PRN

## 2022-06-14 MED ORDER — INSULIN ASPART 100 UNIT/ML IJ SOLN: 0.0000 [IU] | INTRAMUSCULAR | Status: DC

## 2022-06-14 MED ORDER — INSULIN ASPART 100 UNIT/ML IJ SOLN
0.0000 [IU] | Freq: Every day | INTRAMUSCULAR | Status: DC
  Administered 2022-06-16: 3 [IU] via SUBCUTANEOUS
  Administered 2022-06-19 – 2022-06-20 (×2): 2 [IU] via SUBCUTANEOUS

## 2022-06-14 MED ORDER — SODIUM CHLORIDE 0.9 % IV SOLN: INTRAVENOUS | Status: AC

## 2022-06-14 MED ORDER — ALBUTEROL SULFATE (2.5 MG/3ML) 0.083% IN NEBU: 2.5000 mg | INHALATION_SOLUTION | RESPIRATORY_TRACT | Status: DC | PRN

## 2022-06-14 MED ORDER — CHLORDIAZEPOXIDE HCL 5 MG PO CAPS
25.0000 mg | ORAL_CAPSULE | Freq: Two times a day (BID) | ORAL | Status: AC
  Administered 2022-06-15 (×2): 25 mg via ORAL
  Filled 2022-06-14 (×2): qty 5

## 2022-06-14 MED ORDER — IPRATROPIUM-ALBUTEROL 0.5-2.5 (3) MG/3ML IN SOLN
RESPIRATORY_TRACT | Status: AC
  Administered 2022-06-14: 3 mL via RESPIRATORY_TRACT
  Filled 2022-06-14: qty 3

## 2022-06-14 MED ORDER — DEXTROSE 5 % IV SOLN
250.0000 mg | INTRAVENOUS | Status: AC
  Administered 2022-06-14 – 2022-06-18 (×5): 250 mg via INTRAVENOUS
  Filled 2022-06-14 (×6): qty 2.5

## 2022-06-14 MED ORDER — CHLORDIAZEPOXIDE HCL 5 MG PO CAPS
25.0000 mg | ORAL_CAPSULE | Freq: Three times a day (TID) | ORAL | Status: AC
  Administered 2022-06-14 (×3): 25 mg via ORAL
  Filled 2022-06-14: qty 1
  Filled 2022-06-14: qty 5
  Filled 2022-06-14: qty 1

## 2022-06-14 NOTE — H&P (Signed)
History and Physical    [name]:[id number] DOB: [birth year] DOA: 06/14/2022 DOS: the patient was seen and examined on 06/14/2022 PCP: [name], MD  Patient coming from: Home  Chief Complaint:  Chief Complaint  Patient presents with   Shortness of Breath   HPI: James [name] is a 62 y.o. 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  Pt presents to the ED with respiratory distress.  Symptoms woke him up from sleep this morning.   EMS was called and upon their arrival patient had lot of chewing tobacco beside him in bed and had tobacco juice running down his face.  Admits to falling asleep with this in his mouth a lot.  Sats were 79% on his baseline 5 L.  He did receive 125 mg Solu-Medrol, 2 g magnesium, 5 mg albuterol and 0.5 mg Atrovent and route.  Ultimately started on CPAP and began improving.  History of similar in the past.    Review of Systems: As mentioned in the history of present illness. All other systems reviewed and are negative. Past Medical History:  Diagnosis Date   Acute on chronic respiratory failure with hypoxia (HCC) 06/08/2013   Acute on chronic respiratory failure with hypoxia and hypercapnia (HCC) 06/08/2013   Alcohol use disorder 11/23/2018   CAP (community acquired pneumonia) 05/17/2018   See admit 05/17/18 ? rml  with   covid pcr neg - rx augmentin > f/u cxr in 4-6 weeks is fine unless condition declines    Community acquired pneumonia 07/12/2021   Community acquired pneumonia of right lower lobe of lung 05/17/2018   See admit 05/17/18 ? 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   Recurrent pneumonia 02/20/2021   Tick bite 06/03/2018   Past Surgical History:  Procedure Laterality Date   LEFT HEART CATH AND CORONARY ANGIOGRAPHY N/A 12/07/2021   Procedure: LEFT HEART CATH AND CORONARY ANGIOGRAPHY;  Surgeon: Runell Gess, MD;  Location: [facility];  Service: Cardiovascular;  Laterality: N/A;   LUNG REMOVAL, PARTIAL     VIDEO ASSISTED THORACOSCOPY (VATS)/THOROCOTOMY Left 06/11/2013   Procedure: VIDEO ASSISTED THORACOSCOPY (VATS)/THOROCOTOMY;  Surgeon: Kerin Perna, MD;  Location: [facility];  Service: Thoracic;  Laterality: Left;   VIDEO BRONCHOSCOPY N/A 06/11/2013   Procedure: VIDEO BRONCHOSCOPY;  Surgeon: Kerin Perna, MD;  Location: [facility] OR;  Service: Thoracic;  Laterality: N/A;   VIDEO BRONCHOSCOPY N/A 08/10/2018   Procedure: VIDEO BRONCHOSCOPY WITH FLUORO;  Surgeon: Lorin Glass, MD;  Location: [facility] ENDOSCOPY;  Service: Endoscopy;  Laterality: N/A;   Social History:  reports that he quit smoking about 10 years ago. His smoking use included cigarettes. He has a 60.00 pack-year smoking history. He has quit using smokeless tobacco.  His smokeless tobacco use included chew. He reports that he does not currently use alcohol after a past usage of about 28.0 standard drinks of  alcohol per week. He reports that he does not currently use drugs after having used the following drugs: Marijuana.  No Known Allergies  Family History  Problem Relation Age of Onset   Heart disease Father    COPD Sister     Prior to Admission medications   Medication Sig Start Date End Date Taking? Authorizing Provider  albuterol (PROVENTIL) (2.5 MG/3ML) 0.083% nebulizer solution Take 3 mLs (2.5 mg total) by nebulization every 4 (four) hours as needed for wheezing or  shortness of breath. 03/02/22   [name], MD  albuterol (VENTOLIN HFA) 108 (90 Base) MCG/ACT inhaler Inhale 2 puffs into the lungs every 6 (six) hours as needed for wheezing or shortness of breath. 03/02/22   [name], MD  atorvastatin (LIPITOR) 10 MG tablet Take 1 tablet (10 mg total) by mouth daily. 03/02/22   [name], MD  Budeson-Glycopyrrol-Formoterol (BREZTRI AEROSPHERE) 160-9-4.8 MCG/ACT AERO Inhale 2 puffs into the lungs in the morning and at bedtime. 10/14/21   Omar Person, MD  Cholecalciferol (VITAMIN D3) 25 MCG (1000 UT) tablet Take 1 tablet (1,000 Units total) by mouth daily. 03/22/22   [name], MD  Continuous Glucose Receiver (FREESTYLE LIBRE 2 READER) DEVI Use to check blood sugar continuously throughout the day. 04/14/22   [name], MD  Continuous Glucose Sensor (FREESTYLE LIBRE 2 SENSOR) MISC Use to check blood sugar continuously throughout the day. 04/14/22   [name], MD  furosemide (LASIX) 40 MG tablet Take 1 tablet (40 mg total) by mouth daily. 06/09/22   [name], MD  glipiZIDE (GLUCOTROL XL) 5 MG 24 hr tablet Take 1 tablet (5 mg total) by mouth daily with breakfast. 06/09/22   [name], MD  losartan (COZAAR) 100 MG tablet Take 1 tablet (100 mg total) by mouth daily. 06/09/22   [name], MD  metoprolol succinate (TOPROL-XL) 50 MG 24 hr tablet Take 1 tablet (50 mg total) by mouth daily. Take with or immediately following a meal. 06/09/22   [name], MD  nystatin cream (MYCOSTATIN) Apply to affected areas 3 (three) times daily. 06/09/22   [name], MD  pantoprazole (PROTONIX) 40 MG tablet Take 1 tablet (40 mg total) by mouth daily. 06/09/22   [name], MD  predniSONE (DELTASONE) 10 MG tablet Take 1 tablet (10 mg total) by mouth daily. 06/09/22   [name], MD  pregabalin (LYRICA) 100 MG capsule Take 1 capsule (100 mg total) by mouth 2 (two) times daily. 06/09/22   [name],  MD    Physical Exam: Vitals:   06/14/22 0500 06/14/22 0515 06/14/22 0530 06/14/22 0534  BP: (!) 140/78 (!) 154/78 (!) 153/80   Pulse: 91 89 89   Resp: 16 16 15    Temp:    97.9 F (36.6 C)  TempSrc:    Axillary  SpO2: 96% 97% 96%    Constitutional: NAD, calm, comfortable, wakes up easily to voice, answers questions appropriately. Respiratory: Wheezing, no tachypnea, accessory muscle use or respiratory distress at time of my exam. Cardiovascular: Regular rate and rhythm, no murmurs / rubs / gallops. No extremity edema. 2+ pedal pulses. No carotid bruits.  Abdomen: no tenderness, no masses palpated. No hepatosplenomegaly. Bowel sounds positive.  Neurologic: CN 2-12 grossly intact. Sensation intact, DTR normal. Strength 5/5 in all 4.  Psychiatric: Normal judgment and insight. Alert and oriented x 3. Normal mood.   Data Reviewed:  Labs on Admission: I have personally reviewed following labs and imaging studies  CBC: Recent Labs  Lab 06/14/22 0118 06/14/22 0136 06/14/22 0348  WBC 13.9*  --   --   NEUTROABS 9.4*  --   --   HGB 12.8* 13.9 13.9  HCT 40.9 41.0 41.0  MCV 94.2  --   --   PLT 316  --   --    Basic Metabolic Panel: Recent Labs  Lab 06/09/22 1038 06/14/22 0118 06/14/22 0136 06/14/22 0348  NA 142 134* 134* 135  K 5.0 4.6 4.3 4.3  CL 102 98  --   --   CO2 25 25  --   --   GLUCOSE 114* 87  --   --   BUN 26 22  --   --   CREATININE 1.09 1.36*  --   --   CALCIUM 10.1 8.7*  --   --   PHOS 3.8  --   --   --    GFR: Estimated Creatinine Clearance: 66.6 mL/min (A) (by C-G formula based on SCr of 1.36 mg/dL (H)). Liver Function Tests: Recent Labs  Lab 06/09/22 1038  ALBUMIN 4.5   No results for input(s): "LIPASE", "AMYLASE" in the last 168 hours. No results for input(s): "AMMONIA" in the last 168 hours. Coagulation Profile: No results for input(s): "INR", "PROTIME" in the last 168 hours. Cardiac Enzymes: No results for input(s): "CKTOTAL", "CKMB",  "CKMBINDEX", "TROPONINI" in the last 168 hours. BNP (last 3 results) No results for input(s): "PROBNP" in the last 8760 hours. HbA1C: No results for input(s): "HGBA1C" in the last 72 hours. CBG: No results for input(s): "GLUCAP" in the last 168 hours. Lipid Profile: No results for input(s): "CHOL", "HDL", "LDLCALC", "TRIG", "CHOLHDL", "LDLDIRECT" in the last 72 hours. Thyroid Function Tests: No results for input(s): "TSH", "T4TOTAL", "FREET4", "T3FREE", "THYROIDAB" in the last 72 hours. Anemia Panel: No results for input(s): "VITAMINB12", "FOLATE", "FERRITIN", "TIBC", "IRON", "RETICCTPCT" in the last 72 hours. Urine analysis:    Component Value Date/Time   COLORURINE YELLOW 07/12/2021 0739   APPEARANCEUR CLEAR 07/12/2021 0739   LABSPEC 1.026 07/12/2021 0739   PHURINE 5.0 07/12/2021 0739   GLUCOSEU 150 (A) 07/12/2021 0739   HGBUR NEGATIVE 07/12/2021 0739   BILIRUBINUR NEGATIVE 07/12/2021 0739   BILIRUBINUR negative 09/25/2018 1432   KETONESUR NEGATIVE 07/12/2021 0739   PROTEINUR NEGATIVE 07/12/2021 0739   UROBILINOGEN 0.2 02/19/2019 1029   NITRITE NEGATIVE 07/12/2021 0739   LEUKOCYTESUR NEGATIVE 07/12/2021 0739    Radiological Exams on Admission: DG Chest Port 1 View  Result Date: 06/14/2022 CLINICAL DATA:  Respiratory distress EXAM: PORTABLE CHEST 1 VIEW COMPARISON:  05/03/2022 FINDINGS: Multiple remote bilateral rib fractures are identified with resultant left chest wall deformity and left-sided volume loss. Stable left pleural thickening. Lungs are clear. No pneumothorax or pleural effusion. Cardiac size within normal limits. Pulmonary vascularity is normal. No acute bone abnormality. IMPRESSION: 1. No active disease. 2. Multiple remote bilateral rib fractures. Electronically Signed   By: Helyn Numbers M.D.   On: 06/14/2022 01:39    EKG: Independently reviewed.   Assessment and Plan: * Acute on chronic respiratory failure with hypoxia and hypercapnia (HCC) On rescue  BIPAP Repeat VBG shows marginal improvement only; However, clinically patient showing dramatic improvement: now RR 16, no accessory muscle use nor respiratory distress, wakes up to voice, answers questions appropriately, states breathing feels much better. As a result, I dont think he needs an ET tube at this time, despite  repeat VBG findings Discussed with Dr. Daleen Bo with PCCM: Recommends repeat VBG in a couple of hours Have ordered VBG for 0700  COPD with acute exacerbation (HCC) COPD pathway PRN and scheduled nebs Solumedrol Rocephin Cont pulse ox Check COVID test  Multiple lung nodules on CT Multiple pulm nodules seen June 2023 had completely resolved on repeat CT in Feb 2024.  Suspicion now is that they were due to an infectious cause that resolved and not neoplastic.  No mention of pulm nodules on the Feb 2024 scan.  Type 2 diabetes mellitus without complication, without long-term current use of insulin (HCC) Med rec pending Pt NPO due to BIPAP Putting on sensitive SSI Q4H for the moment, may need to up titrate due to steroids being used for COPD treatment.  Hypertension Med rec pending. Pt NPO at the moment due to BIPAP.      Advance Care Planning:   Code Status: Full Code  Consults: curbsided Dr. Daleen Bo given persistent acidosis on VBG despite clinical improvement in patients breathing.  Family Communication: No family in room  Severity of Illness: The appropriate patient status for this patient is INPATIENT. Inpatient status is judged to be reasonable and necessary in order to provide the required intensity of service to ensure the patient's safety. The patient's presenting symptoms, physical exam findings, and initial radiographic and laboratory data in the context of their chronic comorbidities is felt to place them at high risk for further clinical deterioration. Furthermore, it is not anticipated that the patient will be medically stable for discharge from the  hospital within 2 midnights of admission.   * I certify that at the point of admission it is my clinical judgment that the patient will require inpatient hospital care spanning beyond 2 midnights from the point of admission due to high intensity of service, high risk for further deterioration and high frequency of surveillance required.*  Author: Hillary Bow., DO 06/14/2022 5:35 AM  For on call review [email or web address].

## 2022-06-14 NOTE — Assessment & Plan Note (Signed)
Multiple pulm nodules seen June 2023 had completely resolved on repeat CT in Feb 2024.  Suspicion now is that they were due to an infectious cause that resolved and not neoplastic.  No mention of pulm nodules on the Feb 2024 scan.

## 2022-06-14 NOTE — Evaluation (Signed)
Physical Therapy Evaluation Patient Details Name: James [name] MRN: [id number] DOB: 28-Mar-1960 Today's Date: 06/14/2022  History of Present Illness  62 y.o. male presents to [facility] hospital on 06/14/2022 with respiratory distress. PMHx:  recurrent PNA, COPD, Covid, DM, HTN, ETOH abuse.  Clinical Impression  Pt presents to PT with deficits in pulmonary function and endurance. Pt is able to mobilize independently, although still fatiguing quickly while mobilizing on 4L [place]. Pt takes prolonged time to recover despite stable sats in 90s. Pt will benefit from frequent mobilization in an effort to improve endurance and pulmonary function. PT anticipates no post-acute PT needs.       Recommendations for follow up therapy are one component of a multi-disciplinary discharge planning process, led by the attending physician.  Recommendations may be updated based on patient status, additional functional criteria and insurance authorization.  Follow Up Recommendations       Assistance Recommended at Discharge PRN  Patient can return home with the following  Assist for transportation    Equipment Recommendations None recommended by PT  Recommendations for Other Services       Functional Status Assessment Patient has had a recent decline in their functional status and demonstrates the ability to make significant improvements in function in a reasonable and predictable amount of time.     Precautions / Restrictions Precautions Precautions: Other (comment) Precaution Comments: monitor sats Restrictions Weight Bearing Restrictions: No      Mobility  Bed Mobility Overal bed mobility: Independent                  Transfers Overall transfer level: Independent Equipment used: None                    Ambulation/Gait Ambulation/Gait assistance: Modified independent (Device/Increase time) Gait Distance (Feet): 250 Feet (additional 2 trials of 40') Assistive device: None Gait  Pattern/deviations: WFL(Within Functional Limits) Gait velocity: functional Gait velocity interpretation: >2.62 ft/sec, indicative of community ambulatory   General Gait Details: steady step-through gait  Stairs            Wheelchair Mobility    Modified Rankin (Stroke Patients Only)       Balance Overall balance assessment: Independent                                           Pertinent Vitals/Pain Pain Assessment Pain Assessment: No/denies pain    Home Living Family/patient expects to be discharged to:: Private residence Living Arrangements: Non-relatives/Friends Available Help at Discharge: Friend(s);Available PRN/intermittently Type of Home: House Home Access: Stairs to enter Entrance Stairs-Rails: None Entrance Stairs-Number of Steps: 2   Home Layout: One level Home Equipment: None      Prior Function Prior Level of Function : Independent/Modified Independent             Mobility Comments: no longer drives but goes out with help to get groceries;       Hand Dominance   Dominant Hand: Right    Extremity/Trunk Assessment   Upper Extremity Assessment Upper Extremity Assessment: Overall WFL for tasks assessed    Lower Extremity Assessment Lower Extremity Assessment: Overall WFL for tasks assessed    Cervical / Trunk Assessment Cervical / Trunk Assessment: Normal  Communication   Communication: No difficulties  Cognition Arousal/Alertness: Awake/alert Behavior During Therapy: WFL for tasks assessed/performed Overall Cognitive Status: Within Functional Limits for  tasks assessed                                          General Comments General comments (skin integrity, edema, etc.): pt on 5L [place] upon PT arrival, desats to low 80s when weaned to room air briefly when going to bathroom. Ambulating on 4L [place] pt sats maintained in 90s, although still reporting significant DOE.    Exercises      Assessment/Plan    PT Assessment Patient needs continued PT services  PT Problem List Decreased activity tolerance;Cardiopulmonary status limiting activity       PT Treatment Interventions Therapeutic activities;Therapeutic exercise;Patient/family education    PT Goals (Current goals can be found in the Care Plan section)  Acute Rehab PT Goals Patient Stated Goal: to improve activity tolerance and pulmonary function PT Goal Formulation: With patient Time For Goal Achievement: 06/28/22 Potential to Achieve Goals: Good Additional Goals Additional Goal #1: Pt will report 1/4 DOE or less when ambulating for >500' to demonstrate improved activity tolerance    Frequency Min 2X/week     Co-evaluation               AM-PAC PT "6 Clicks" Mobility  Outcome Measure Help needed turning from your back to your side while in a flat bed without using bedrails?: None Help needed moving from lying on your back to sitting on the side of a flat bed without using bedrails?: None Help needed moving to and from a bed to a chair (including a wheelchair)?: None Help needed standing up from a chair using your arms (e.g., wheelchair or bedside chair)?: None Help needed to walk in hospital room?: None Help needed climbing 3-5 steps with a railing? : None 6 Click Score: 24    End of Session Equipment Utilized During Treatment: Oxygen Activity Tolerance: Patient limited by fatigue Patient left: in bed;with call bell/phone within reach Nurse Communication: Mobility status PT Visit Diagnosis: Other abnormalities of gait and mobility (R26.89)    Time: 1313-1350 PT Time Calculation (min) (ACUTE ONLY): 37 min   Charges:   PT Evaluation $PT Eval Low Complexity: 1 Low          Arlyss Gandy, PT, DPT Acute Rehabilitation Office [phone]   Arlyss Gandy 06/14/2022, 1:55 PM

## 2022-06-14 NOTE — Progress Notes (Signed)
RT note. Patient taken off bipap at this time per patient request. Labored breathing is noted, attempt to persuade patient to go back on bipap. Patient insist to have a break. RT will continue to monitor. Patient left on 5 L [place].   06/14/22 0836  Therapy Vitals  Pulse Rate 92  Resp (!) 22  MEWS Score/Color  MEWS Score 1  MEWS Score Color Green  Respiratory Assessment  Assessment Type Mid-treatment  Respiratory Pattern Regular;Labored;Dyspnea at rest;Accessory muscle use  Chest Assessment Chest expansion symmetrical  Bilateral Breath Sounds Expiratory wheezes;Diminished  Oxygen Therapy/Pulse Ox  O2 Device Nasal Cannula  O2 Therapy Oxygen  O2 Flow Rate (L/min) (S)  5 L/min (2L baseline)  SpO2 97 %

## 2022-06-14 NOTE — ED Notes (Signed)
Currently resting on stretcher with Bi-pap in place.

## 2022-06-14 NOTE — ED Triage Notes (Signed)
Pt is in for shortness of breath, on CPAP  with medic, started 1hr ago, has a habit of falling asleep with his chew in his mouth, potential risk for aspiration of the chew.   79% spo2 on medical on 5l  30rr  Diminished breath sounds initially   Pt has Hx of CHF  Medic vitals  100% on CPAP  25rr

## 2022-06-14 NOTE — ED Provider Notes (Signed)
[place] EMERGENCY DEPARTMENT AT [facility] Provider Note   CSN: [id number] Arrival date & time: 06/14/22  0110     History  Chief Complaint  Patient presents with   Shortness of Breath    James [name] is a 62 y.o. male.  The history is provided by the patient and medical records.  Shortness of Breath Associated symptoms: wheezing    62 year old male with history of hypertension, COPD on chronic oxygen, GERD, diabetes, peripheral neuropathy, presenting to the ED with respiratory distress.  Woke up from sleep about 1 hour ago was having a lot of difficulty breathing.  EMS was called and upon their arrival patient had lot of chewing tobacco beside him in bed and had tobacco juice running down his face.  Admits to falling asleep with this in his mouth a lot.  Sats were 79% on his baseline 5 L.  He did receive 125 mg Solu-Medrol, 2 g magnesium, 5 mg albuterol and 0.5 mg Atrovent and route.  Ultimately started on CPAP and began improving.  History of similar in the past.  Home Medications Prior to Admission medications   Medication Sig Start Date End Date Taking? Authorizing Provider  albuterol (PROVENTIL) (2.5 MG/3ML) 0.083% nebulizer solution Take 3 mLs (2.5 mg total) by nebulization every 4 (four) hours as needed for wheezing or shortness of breath. 03/02/22   [name], MD  albuterol (VENTOLIN HFA) 108 (90 Base) MCG/ACT inhaler Inhale 2 puffs into the lungs every 6 (six) hours as needed for wheezing or shortness of breath. 03/02/22   [name], MD  atorvastatin (LIPITOR) 10 MG tablet Take 1 tablet (10 mg total) by mouth daily. 03/02/22   [name], MD  Budeson-Glycopyrrol-Formoterol (BREZTRI AEROSPHERE) 160-9-4.8 MCG/ACT AERO Inhale 2 puffs into the lungs in the morning and at bedtime. 10/14/21   Omar Person, MD  Cholecalciferol (VITAMIN D3) 25 MCG (1000 UT) tablet Take 1 tablet (1,000 Units total) by mouth daily. 03/22/22   [name], MD   Continuous Glucose Receiver (FREESTYLE LIBRE 2 READER) DEVI Use to check blood sugar continuously throughout the day. 04/14/22   [name], MD  Continuous Glucose Sensor (FREESTYLE LIBRE 2 SENSOR) MISC Use to check blood sugar continuously throughout the day. 04/14/22   [name], MD  furosemide (LASIX) 40 MG tablet Take 1 tablet (40 mg total) by mouth daily. 06/09/22   [name], MD  glipiZIDE (GLUCOTROL XL) 5 MG 24 hr tablet Take 1 tablet (5 mg total) by mouth daily with breakfast. 06/09/22   [name], MD  losartan (COZAAR) 100 MG tablet Take 1 tablet (100 mg total) by mouth daily. 06/09/22   [name], MD  metoprolol succinate (TOPROL-XL) 50 MG 24 hr tablet Take 1 tablet (50 mg total) by mouth daily. Take with or immediately following a meal. 06/09/22   [name], MD  nystatin cream (MYCOSTATIN) Apply to affected areas 3 (three) times daily. 06/09/22   [name], MD  pantoprazole (PROTONIX) 40 MG tablet Take 1 tablet (40 mg total) by mouth daily. 06/09/22   [name], MD  predniSONE (DELTASONE) 10 MG tablet Take 1 tablet (10 mg total) by mouth daily. 06/09/22   [name], MD  pregabalin (LYRICA) 100 MG capsule Take 1 capsule (100 mg total) by mouth 2 (two) times daily. 06/09/22   [name], MD      Allergies    Patient  has no known allergies.    Review of Systems   Review of Systems  Respiratory:  Positive for shortness of breath and wheezing.   All other systems reviewed and are negative.   Physical Exam Updated Vital Signs BP (!) 142/91   Pulse 99   Temp 98.2 F (36.8 C) (Axillary)   Resp 17   SpO2 96%   Physical Exam Vitals and nursing note reviewed.  Constitutional:      Appearance: He is well-developed.  HENT:     Head: Normocephalic and atraumatic.  Eyes:     Conjunctiva/sclera: Conjunctivae normal.     Pupils: Pupils are equal, round, and reactive to light.  Cardiovascular:     Rate and Rhythm: Normal  rate and regular rhythm.     Heart sounds: Normal heart sounds.  Pulmonary:     Effort: Tachypnea, respiratory distress and retractions present.     Breath sounds: Wheezing present.  Abdominal:     General: Bowel sounds are normal.     Palpations: Abdomen is soft.  Musculoskeletal:        General: Normal range of motion.     Cervical back: Normal range of motion.  Skin:    General: Skin is warm and dry.  Neurological:     Mental Status: He is alert and oriented to person, place, and time.     ED Results / Procedures / Treatments   Labs (all labs ordered are listed, but only abnormal results are displayed) Labs Reviewed  CBC WITH DIFFERENTIAL/PLATELET - Abnormal; Notable for the following components:      Result Value   WBC 13.9 (*)    Hemoglobin 12.8 (*)    Neutro Abs 9.4 (*)    Monocytes Absolute 1.2 (*)    Abs Immature Granulocytes 0.11 (*)    All other components within normal limits  BASIC METABOLIC PANEL - Abnormal; Notable for the following components:   Sodium 134 (*)    Creatinine, Ser 1.36 (*)    Calcium 8.7 (*)    GFR, Estimated 59 (*)    All other components within normal limits  I-STAT ARTERIAL BLOOD GAS, ED - Abnormal; Notable for the following components:   pH, Arterial 7.193 (*)    pCO2 arterial 73.1 (*)    Bicarbonate 28.2 (*)    Sodium 134 (*)    All other components within normal limits  BRAIN NATRIURETIC PEPTIDE  TROPONIN I (HIGH SENSITIVITY)  TROPONIN I (HIGH SENSITIVITY)    EKG None  Radiology DG Chest Port 1 View  Result Date: 06/14/2022 CLINICAL DATA:  Respiratory distress EXAM: PORTABLE CHEST 1 VIEW COMPARISON:  05/03/2022 FINDINGS: Multiple remote bilateral rib fractures are identified with resultant left chest wall deformity and left-sided volume loss. Stable left pleural thickening. Lungs are clear. No pneumothorax or pleural effusion. Cardiac size within normal limits. Pulmonary vascularity is normal. No acute bone abnormality.  IMPRESSION: 1. No active disease. 2. Multiple remote bilateral rib fractures. Electronically Signed   By: Helyn Numbers M.D.   On: 06/14/2022 01:39    Procedures Procedures    CRITICAL CARE Performed by: Garlon Hatchet   Total critical care time: 45 minutes  Critical care time was exclusive of separately billable procedures and treating other patients.  Critical care was necessary to treat or prevent imminent or life-threatening deterioration.  Critical care was time spent personally by me on the following activities: development of treatment plan with patient and/or surrogate as well as nursing, discussions with  consultants, evaluation of patient's response to treatment, examination of patient, obtaining history from patient or surrogate, ordering and performing treatments and interventions, ordering and review of laboratory studies, ordering and review of radiographic studies, pulse oximetry and re-evaluation of patient's condition.   Medications Ordered in ED Medications  albuterol (PROVENTIL) (2.5 MG/3ML) 0.083% nebulizer solution (10 mg/hr Nebulization Given 06/14/22 0123)    ED Course/ Medical Decision Making/ A&P                             Medical Decision Making Amount and/or Complexity of Data Reviewed Labs: ordered. Radiology: ordered and independent interpretation performed. ECG/medicine tests: ordered and independent interpretation performed.  Risk Prescription drug management. Decision regarding hospitalization.   62 year old male presenting to the ED with respiratory distress.  Called out about 1 hour prior to arrival, sats 79% upon EMS arrival on his baseline 5 L.  Apparently did fall asleep with chewing tobacco in his mouth.  He received Solu-Medrol, magnesium, and nebs and route but ultimately required CPAP.  He is distress on arrival, diaphoretic, tachypneic, with retractions.  He has diffuse wheezes on exam.  Transition to BiPAP, started continuous neb  through BiPAP.  Initial ABG with pH 7.193, CO2 73.  Surprisingly he has no prior available for comparison so unclear how often baseline this is.  Will monitor and plan to repeat.  Labs and chest x-ray pending.  2:51 AM Wheezing has improved but still present.  He is conversant through bipap and overall appear improved from time of arrival.  Labs as above--does have leukocytosis of 13.9.  Serum creatinine 1.36.  normal trop.  CXR without acute findings.  Planning for repeat ABG.  He will require admission.  Discussed with hospitalist, Dr. Julian Reil-- will admit for ongoing care.  Final Clinical Impression(s) / ED Diagnoses Final diagnoses:  COPD exacerbation [facility])    Rx / DC Orders ED Discharge Orders     None         Garlon Hatchet, PA-C 06/14/22 0543    Tilden Fossa, MD 06/15/22 [phone]

## 2022-06-14 NOTE — ED Notes (Signed)
ED TO INPATIENT HANDOFF REPORT  ED Nurse Name and Phone #: Donny Pique and Dahlia Client, [phone]  S Name/Age/Gender James [name] 62 y.o. male Room/Bed: 018C/018C  Code Status   Code Status: Full Code  Home/SNF/Other Home Patient oriented to: self, place, time, and situation Is this baseline? Yes   Triage Complete: Triage complete  Chief Complaint COPD with acute exacerbation (HCC) [J44.1]  Triage Note Pt is in for shortness of breath, on CPAP  with medic, started 1hr ago, has a habit of falling asleep with his chew in his mouth, potential risk for aspiration of the chew.   79% spo2 on medical on 5l  30rr  Diminished breath sounds initially   Pt has Hx of CHF  Medic vitals  100% on CPAP  25rr     Allergies No Known Allergies  Level of Care/Admitting Diagnosis ED Disposition     ED Disposition  Admit   Condition  --   Comment  Hospital [facility]: [facility] [[id number]]  Level of Care: Progressive [102]  Admit to Progressive based on following criteria: RESPIRATORY PROBLEMS hypoxemic/hypercapnic respiratory failure that is responsive to NIPPV (BiPAP) or High Flow Nasal Cannula (6-80 lpm). Frequent assessment/intervention, no > Q2 hrs < Q4 hrs, to maintain oxygenation and pulmonary hygiene.  May admit patient to [facility] or [facility] if equivalent level of care is available:: No  Covid Evaluation: Symptomatic Person Under Investigation (PUI) or recent exposure (last 10 days) *Testing Required*  Diagnosis: COPD with acute exacerbation [facility]) [[id number]]  Admitting Physician: Wyvonnia Dusky  Attending Physician: Hillary Bow [[id number]]  Bed request comments: On rescue BIPAP  Certification:: I certify this patient will need inpatient services for at least 2 midnights  Estimated Length of Stay: 5          B Medical/Surgery History Past Medical History:  Diagnosis Date   Acute on chronic respiratory failure with hypoxia (HCC) 06/08/2013   Acute on  chronic respiratory failure with hypoxia and hypercapnia (HCC) 06/08/2013   Alcohol use disorder 11/23/2018   CAP (community acquired pneumonia) 05/17/2018   See admit 05/17/18 ? rml  with   covid pcr neg - rx augmentin > f/u cxr in 4-6 weeks is fine unless condition declines    Community acquired pneumonia 07/12/2021   Community acquired pneumonia of right lower lobe of lung 05/17/2018   See admit 05/17/18 ? 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   Recurrent pneumonia 02/20/2021   Tick bite 06/03/2018   Past Surgical History:  Procedure Laterality Date   LEFT HEART CATH AND CORONARY ANGIOGRAPHY N/A 12/07/2021   Procedure: LEFT HEART CATH AND CORONARY ANGIOGRAPHY;  Surgeon: Runell Gess, MD;  Location: [facility] INVASIVE CV LAB;  Service: Cardiovascular;  Laterality: N/A;   LUNG REMOVAL, PARTIAL     VIDEO ASSISTED THORACOSCOPY (VATS)/THOROCOTOMY Left 06/11/2013   Procedure: VIDEO ASSISTED THORACOSCOPY (VATS)/THOROCOTOMY;  Surgeon: Kerin Perna, MD;  Location: [facility] OR;  Service: Thoracic;  Laterality:  Left;   VIDEO BRONCHOSCOPY N/A 06/11/2013   Procedure: VIDEO BRONCHOSCOPY;  Surgeon: Kerin Perna, MD;  Location: [facility] OR;  Service: Thoracic;  Laterality: N/A;   VIDEO BRONCHOSCOPY N/A 08/10/2018   Procedure: VIDEO BRONCHOSCOPY WITH FLUORO;  Surgeon: Lorin Glass, MD;  Location: [facility] ENDOSCOPY;  Service: Endoscopy;  Laterality: N/A;     A IV Location/Drains/Wounds Patient Lines/Drains/Airways Status     Active  Line/Drains/Airways     Name Placement date Placement time Site Days   Peripheral IV 06/14/22 18 G 1.16" Anterior;Distal;Right;Upper Arm 06/14/22  --  Arm  less than 1   Peripheral IV 06/14/22 20 G Left Antecubital 06/14/22  1241  Antecubital  less than 1   Wound / Incision (Open or Dehisced) 12/02/21 Irritant Dermatitis (Moisture Associated Skin Damage) Groin Right;Left blotchy red 12/02/21  2000  Groin  194   Wound / Incision (Open or Dehisced) 12/02/21 Irritant Dermatitis (Moisture Associated Skin Damage) Buttocks Right;Left blotchy redness 12/02/21  2000  Buttocks  194            Intake/Output Last 24 hours No intake or output data in the 24 hours ending 06/14/22 1735  Labs/Imaging Results for orders placed or performed during the hospital encounter of 06/14/22 (from the past 48 hour(s))  CBC with Differential     Status: Abnormal   Collection Time: 06/14/22  1:18 AM  Result Value Ref Range   WBC 13.9 (H) 4.0 - 10.5 K/uL   RBC 4.34 4.22 - 5.81 MIL/uL   Hemoglobin 12.8 (L) 13.0 - 17.0 g/dL   HCT 60.4 54.0 - 98.1 %   MCV 94.2 80.0 - 100.0 fL   MCH 29.5 26.0 - 34.0 pg   MCHC 31.3 30.0 - 36.0 g/dL   RDW 19.1 47.8 - 29.5 %   Platelets 316 150 - 400 K/uL   nRBC 0.0 0.0 - 0.2 %   Neutrophils Relative % 67 %   Neutro Abs 9.4 (H) 1.7 - 7.7 K/uL   Lymphocytes Relative 22 %   Lymphs Abs 3.1 0.7 - 4.0 K/uL   Monocytes Relative 9 %   Monocytes Absolute 1.2 (H) 0.1 - 1.0 K/uL   Eosinophils Relative 0 %   Eosinophils Absolute 0.0 0.0 - 0.5 K/uL   Basophils Relative 1 %   Basophils Absolute 0.1 0.0 - 0.1 K/uL   Immature Granulocytes 1 %   Abs Immature Granulocytes 0.11 (H) 0.00 - 0.07 K/uL    Comment: Performed at [facility] Lab, [address] [place], [place] [address]  Basic metabolic panel     Status: Abnormal   Collection Time: 06/14/22  1:18 AM  Result Value Ref Range   Sodium 134 (L) 135 - 145 mmol/L   Potassium 4.6 3.5 - 5.1 mmol/L    Comment: HEMOLYSIS AT THIS  LEVEL MAY AFFECT RESULT HEMOLYSIS AT THIS LEVEL MAY AFFECT RESULT    Chloride 98 98 - 111 mmol/L   CO2 25 22 - 32 mmol/L   Glucose, Bld 87 70 - 99 mg/dL    Comment: Glucose reference range applies only to samples taken after fasting for at least 8 hours.   BUN 22 8 - 23 mg/dL   Creatinine, Ser 8.65 (H) 0.61 - 1.24 mg/dL   Calcium 8.7 (L) 8.9 - 10.3 mg/dL   GFR, Estimated 59 (L) >60 mL/min    Comment: (NOTE) Calculated using the CKD-EPI Creatinine Equation (2021)    Anion gap 11 5 - 15  Comment: Performed at [facility] Lab, [address] [place], [place] [address]  Troponin I (High Sensitivity)     Status: None   Collection Time: 06/14/22  1:18 AM  Result Value Ref Range   Troponin I (High Sensitivity) 11 <18 ng/L    Comment: (NOTE) Elevated high sensitivity troponin I (hsTnI) values and significant  changes across serial measurements may suggest ACS but many other  chronic and acute conditions are known to elevate hsTnI results.  Refer to the "Links" section for chest pain algorithms and additional  guidance. Performed at [facility] Lab, [address], [place], [place] [address]   Brain natriuretic peptide     Status: None   Collection Time: 06/14/22  1:18 AM  Result Value Ref Range   B Natriuretic Peptide 52.4 0.0 - 100.0 pg/mL    Comment: Performed at [facility] Lab, [address] [place], [place] [address]  I-Stat arterial blood gas, ED [facility] ED, [facility], [facility])     Status: Abnormal   Collection Time: 06/14/22  1:36 AM  Result Value Ref Range   pH, Arterial 7.193 (LL) 7.35 - 7.45   pCO2 arterial 73.1 (HH) 32 - 48 mmHg   pO2, Arterial 104 83 - 108 mmHg   Bicarbonate 28.2 (H) 20.0 - 28.0 mmol/L   TCO2 30 22 - 32 mmol/L   O2 Saturation 96 %   Acid-base deficit 2.0 0.0 - 2.0 mmol/L   Sodium 134 (L) 135 - 145 mmol/L   Potassium 4.3 3.5 - 5.1 mmol/L   Calcium, Ion 1.21 1.15 - 1.40 mmol/L   HCT 41.0 39.0 - 52.0 %   Hemoglobin 13.9 13.0 - 17.0 g/dL   Patient  temperature 98.2 F    Collection site RADIAL, ALLEN'S TEST ACCEPTABLE    Drawn by Operator    Sample type ARTERIAL    Comment NOTIFIED PHYSICIAN   Troponin I (High Sensitivity)     Status: None   Collection Time: 06/14/22  3:38 AM  Result Value Ref Range   Troponin I (High Sensitivity) 12 <18 ng/L    Comment: (NOTE) Elevated high sensitivity troponin I (hsTnI) values and significant  changes across serial measurements may suggest ACS but many other  chronic and acute conditions are known to elevate hsTnI results.  Refer to the "Links" section for chest pain algorithms and additional  guidance. Performed at [facility] Lab, [address], [place], [place] [address]   SARS Coronavirus 2 by RT PCR (hospital order, performed in [facility] hospital lab) *cepheid single result test* Anterior Nasal Swab     Status: None   Collection Time: 06/14/22  3:44 AM   Specimen: Anterior Nasal Swab  Result Value Ref Range   SARS Coronavirus 2 by RT PCR NEGATIVE NEGATIVE    Comment: Performed at [facility] Lab, [address] [place], [place] [address]  I-Stat venous blood gas, ED     Status: Abnormal   Collection Time: 06/14/22  3:48 AM  Result Value Ref Range   pH, Ven 7.205 (L) 7.25 - 7.43   pCO2, Ven 69.1 (H) 44 - 60 mmHg   pO2, Ven 47 (H) 32 - 45 mmHg   Bicarbonate 27.3 20.0 - 28.0 mmol/L   TCO2 29 22 - 32 mmol/L   O2 Saturation 71 %   Acid-base deficit 2.0 0.0 - 2.0 mmol/L   Sodium 135 135 - 145 mmol/L   Potassium 4.3 3.5 - 5.1 mmol/L  Calcium, Ion 1.22 1.15 - 1.40 mmol/L   HCT 41.0 39.0 - 52.0 %   Hemoglobin 13.9 13.0 - 17.0 g/dL   Patient temperature 40.9 F    Collection site RADIAL, ALLEN'S TEST ACCEPTABLE    Drawn by Operator    Sample type VENOUS    Comment NOTIFIED PHYSICIAN   HIV Antibody (routine testing w rflx)     Status: None   Collection Time: 06/14/22  8:00 AM  Result Value Ref Range   HIV Screen 4th Generation wRfx Non Reactive Non Reactive    Comment:  Performed at [facility] Lab, [address], [place], [place] [address]  Blood gas, venous     Status: Abnormal   Collection Time: 06/14/22  8:00 AM  Result Value Ref Range   pH, Ven 7.29 7.25 - 7.43   pCO2, Ven 57 44 - 60 mmHg   pO2, Ven 65 (H) 32 - 45 mmHg   Bicarbonate 27.4 20.0 - 28.0 mmol/L   Acid-base deficit 0.3 0.0 - 2.0 mmol/L   O2 Saturation 93.5 %   Patient temperature 37.0    Drawn by LE     Comment: Performed at [facility] Lab, [address] [place], [place] [address]  CBG monitoring, ED     Status: Abnormal   Collection Time: 06/14/22  8:03 AM  Result Value Ref Range   Glucose-Capillary 240 (H) 70 - 99 mg/dL    Comment: Glucose reference range applies only to samples taken after fasting for at least 8 hours.   Comment 1 Document in Chart   CBG monitoring, ED     Status: Abnormal   Collection Time: 06/14/22 12:15 PM  Result Value Ref Range   Glucose-Capillary 184 (H) 70 - 99 mg/dL    Comment: Glucose reference range applies only to samples taken after fasting for at least 8 hours.  CBG monitoring, ED     Status: Abnormal   Collection Time: 06/14/22  4:01 PM  Result Value Ref Range   Glucose-Capillary 177 (H) 70 - 99 mg/dL    Comment: Glucose reference range applies only to samples taken after fasting for at least 8 hours.   DG Chest Port 1 View  Result Date: 06/14/2022 CLINICAL DATA:  Respiratory distress EXAM: PORTABLE CHEST 1 VIEW COMPARISON:  05/03/2022 FINDINGS: Multiple remote bilateral rib fractures are identified with resultant left chest wall deformity and left-sided volume loss. Stable left pleural thickening. Lungs are clear. No pneumothorax or pleural effusion. Cardiac size within normal limits. Pulmonary vascularity is normal. No acute bone abnormality. IMPRESSION: 1. No active disease. 2. Multiple remote bilateral rib fractures. Electronically Signed   By: Helyn Numbers M.D.   On: 06/14/2022 01:39    Pending Labs Unresulted Labs (From admission,  onward)     Start     Ordered   06/15/22 0500  Basic metabolic panel  Daily,   R      06/14/22 0831   06/15/22 0500  CBC  Daily,   R      06/14/22 0831   06/15/22 0500  Magnesium  Daily,   R      06/14/22 1102   06/14/22 0833  Hemoglobin A1c  Once,   R       Comments: To assess prior glycemic control    06/14/22 0832            Vitals/Pain Today's Vitals   06/14/22 1230 06/14/22 1245 06/14/22 1359 06/14/22 1545  BP: (!) 129/106 [name])  147/80    Pulse: 79 81  77  Resp: 17 15  15   Temp:   97.9 F (36.6 C)   TempSrc:      SpO2: 100% 96%  98%  Weight:      Height:      PainSc:        Isolation Precautions No active isolations  Medications Medications  enoxaparin (LOVENOX) injection 40 mg (40 mg Subcutaneous Given 06/14/22 1631)  cefTRIAXone (ROCEPHIN) 1 g in sodium chloride 0.9 % 100 mL IVPB (0 g Intravenous Stopped 06/14/22 0528)  methylPREDNISolone sodium succinate (SOLU-MEDROL) 40 mg/mL injection 40 mg (40 mg Intravenous Given 06/14/22 1017)  metoprolol tartrate (LOPRESSOR) injection 5 mg (has no administration in time range)  hydrALAZINE (APRESOLINE) injection 10 mg (has no administration in time range)  ipratropium-albuterol (DUONEB) 0.5-2.5 (3) MG/3ML nebulizer solution 3 mL (has no administration in time range)  senna-docusate (Senokot-S) tablet 1 tablet (has no administration in time range)  guaiFENesin (ROBITUSSIN) 100 MG/5ML liquid 5 mL (has no administration in time range)  ondansetron (ZOFRAN) injection 4 mg (has no administration in time range)  ipratropium-albuterol (DUONEB) 0.5-2.5 (3) MG/3ML nebulizer solution 3 mL (3 mLs Nebulization Given 06/14/22 1625)  budesonide (PULMICORT) nebulizer solution 0.5 mg (0.5 mg Nebulization Not Given 06/14/22 0835)  azithromycin (ZITHROMAX) 250 mg in dextrose 5 % 125 mL IVPB (0 mg Intravenous Stopped 06/14/22 1348)  insulin aspart (novoLOG) injection 0-15 Units (3 Units Subcutaneous Given 06/14/22 1647)  0.9 %  sodium chloride  infusion ( Intravenous New Bag/Given 06/14/22 0856)  LORazepam (ATIVAN) tablet 1-4 mg (has no administration in time range)    Or  LORazepam (ATIVAN) injection 1-4 mg (has no administration in time range)  thiamine (VITAMIN B1) tablet 100 mg (100 mg Oral Given 06/14/22 1242)    Or  thiamine (VITAMIN B1) injection 100 mg ( Intravenous See Alternative 06/14/22 1242)  folic acid (FOLVITE) tablet 1 mg (1 mg Oral Given 06/14/22 1242)  multivitamin with minerals tablet 1 tablet (1 tablet Oral Given 06/14/22 1242)  chlordiazePOXIDE (LIBRIUM) capsule 25 mg (25 mg Oral Given 06/14/22 1634)    Followed by  chlordiazePOXIDE (LIBRIUM) capsule 25 mg (has no administration in time range)    Followed by  chlordiazePOXIDE (LIBRIUM) capsule 25 mg (has no administration in time range)  albuterol (PROVENTIL) (2.5 MG/3ML) 0.083% nebulizer solution (10 mg/hr Nebulization Given 06/14/22 0123)  ipratropium-albuterol (DUONEB) 0.5-2.5 (3) MG/3ML nebulizer solution (  Not Given 06/14/22 0838)    Mobility walks     Focused Assessments    R Recommendations: See Admitting Provider Note  Report given to:   Additional Notes: Patient is A&Ox4, ambulatory but needs O2. He got very anxious without the O2 [place] earlier but responded well once it was replaced and was not hypoxic at all. Swallows pills whole and regular diet.

## 2022-06-14 NOTE — Assessment & Plan Note (Addendum)
On rescue BIPAP Repeat VBG shows marginal improvement only; However, clinically patient showing dramatic improvement: now RR 16, no accessory muscle use nor respiratory distress, wakes up to voice, answers questions appropriately, states breathing feels much better. As a result, I dont think he needs an ET tube at this time, despite repeat VBG findings Discussed with Dr. Daleen Bo with PCCM: Recommends repeat VBG in a couple of hours Have ordered VBG for 0700

## 2022-06-14 NOTE — Assessment & Plan Note (Signed)
Med rec pending Pt NPO due to BIPAP Putting on sensitive SSI Q4H for the moment, may need to up titrate due to steroids being used for COPD treatment.

## 2022-06-14 NOTE — Progress Notes (Signed)
PROGRESS NOTE    James CESARIO  [name]:[id number] DOB: [birth year] DOA: 06/14/2022 PCP: [name], MD   Brief Narrative:  62 year old with history of COPD, chronic hypoxia/hypercapnic respiratory failure, chronically on 5 L nasal cannula, recurrent pneumonia, alcohol use in remission, DM2, HTN presented to the ED with respiratory distress.  Upon admission noted to be in hypoxic and hypercapnic respiratory failure requiring BiPAP.  Chest x-ray was negative besides multiple remote bilateral rib fractures   Assessment & Plan:  Principal Problem:   Acute on chronic respiratory failure with hypoxia and hypercapnia (HCC) Active Problems:   COPD with acute exacerbation (HCC)   COPD mixed type (HCC)   Hypertension   Type 2 diabetes mellitus without complication, without long-term current use of insulin (HCC)   Multiple lung nodules on CT     Assessment and Plan: * Acute on chronic respiratory failure with hypoxia and hypercapnia (HCC) Acute COPD exacerbation -Unfortunately patient continues to use tobacco.  Initially was on CO2 narcosis which is improving.  Repeat ABG appears to be improving as well.  Continue aggressive bronchodilators, I-S/flutter valve.  PCCM was curbside by admitting provider -BNP negative.  Check procalcitonin - Will continue empiric antibiotics - COVID-negative  Acute kidney injury - Baseline creatinine 1.0, admission creatinine 1.36.  Continue to monitor.  ETOH use -daily use. Librium taper. CIWA.   Multiple lung nodules on CT Multiple pulm nodules seen June 2023 had completely resolved on repeat CT in Feb 2024.  Suspicion now is that they were due to an infectious cause that resolved and not neoplastic.  No mention of pulm nodules on the Feb 2024 scan.  Type 2 diabetes mellitus without complication, without long-term current use of insulin (HCC) Diabetic diet, of BiPAP.  Sliding scale and Accu-Cheks  Hypertension Ivp rn    DVT prophylaxis:  Lovenox Code Status: Full code Family Communication:   Status is: Inpatient Continue hospital stay.       Diet Orders (From admission, onward)     Start     Ordered   06/14/22 0341  Diet NPO time specified Except for: Sips with Meds  Diet effective now       Question:  Except for  Answer:  Clearance Coots with Meds   06/14/22 0342            Subjective:  Seen and examined at bedside.  Initially was on BiPAP but later as he was feeling better she was transitioned off He still very tremulous.  Admits of drinking at least 6 pack of beer daily  Examination:  General exam: Appears calm and comfortable  Respiratory system: Diffuse diminished breath sounds Cardiovascular system: S1 & S2 heard, RRR. No JVD, murmurs, rubs, gallops or clicks. No pedal edema. Gastrointestinal system: Abdomen is nondistended, soft and nontender. No organomegaly or masses felt. Normal bowel sounds heard. Central nervous system: Alert and oriented. No focal neurological deficits. Extremities: Symmetric 5 x 5 power. Skin: No rashes, lesions or ulcers Psychiatry: Judgement and insight appear normal. Mood & affect appropriate.  Somewhat nebulous  Objective: Vitals:   06/14/22 0700 06/14/22 0715 06/14/22 0730 06/14/22 0733  BP: (!) 142/108 (!) 149/97 (!) 181/117   Pulse: 95 93 94   Resp: 19 17 (!) 21   Temp:      TempSrc:      SpO2: 97% 98% 98%   Weight:    100.2 kg  Height:    5\' 9"  (1.753 m)   No intake or output data in  the 24 hours ending 06/14/22 0826 Filed Weights   06/14/22 0733  Weight: 100.2 kg    Scheduled Meds:  enoxaparin (LOVENOX) injection  40 mg Subcutaneous Q24H   insulin aspart  0-9 Units Subcutaneous Q4H   methylPREDNISolone (SOLU-MEDROL) injection  40 mg Intravenous Q12H   mometasone-formoterol  2 puff Inhalation BID   umeclidinium bromide  1 puff Inhalation Daily   Continuous Infusions:  cefTRIAXone (ROCEPHIN)  IV Stopped (06/14/22 0528)    Nutritional status     Body  mass index is 32.62 kg/m.  Data Reviewed:   CBC: Recent Labs  Lab 06/14/22 0118 06/14/22 0136 06/14/22 0348  WBC 13.9*  --   --   NEUTROABS 9.4*  --   --   HGB 12.8* 13.9 13.9  HCT 40.9 41.0 41.0  MCV 94.2  --   --   PLT 316  --   --    Basic Metabolic Panel: Recent Labs  Lab 06/09/22 1038 06/14/22 0118 06/14/22 0136 06/14/22 0348  NA 142 134* 134* 135  K 5.0 4.6 4.3 4.3  CL 102 98  --   --   CO2 25 25  --   --   GLUCOSE 114* 87  --   --   BUN 26 22  --   --   CREATININE 1.09 1.36*  --   --   CALCIUM 10.1 8.7*  --   --   PHOS 3.8  --   --   --    GFR: Estimated Creatinine Clearance: 66.6 mL/min (A) (by C-G formula based on SCr of 1.36 mg/dL (H)). Liver Function Tests: Recent Labs  Lab 06/09/22 1038  ALBUMIN 4.5   No results for input(s): "LIPASE", "AMYLASE" in the last 168 hours. No results for input(s): "AMMONIA" in the last 168 hours. Coagulation Profile: No results for input(s): "INR", "PROTIME" in the last 168 hours. Cardiac Enzymes: No results for input(s): "CKTOTAL", "CKMB", "CKMBINDEX", "TROPONINI" in the last 168 hours. BNP (last 3 results) No results for input(s): "PROBNP" in the last 8760 hours. HbA1C: No results for input(s): "HGBA1C" in the last 72 hours. CBG: Recent Labs  Lab 06/14/22 0803  GLUCAP 240*   Lipid Profile: No results for input(s): "CHOL", "HDL", "LDLCALC", "TRIG", "CHOLHDL", "LDLDIRECT" in the last 72 hours. Thyroid Function Tests: No results for input(s): "TSH", "T4TOTAL", "FREET4", "T3FREE", "THYROIDAB" in the last 72 hours. Anemia Panel: No results for input(s): "VITAMINB12", "FOLATE", "FERRITIN", "TIBC", "IRON", "RETICCTPCT" in the last 72 hours. Sepsis Labs: No results for input(s): "PROCALCITON", "LATICACIDVEN" in the last 168 hours.  Recent Results (from the past 240 hour(s))  SARS Coronavirus 2 by RT PCR (hospital order, performed in [facility] hospital lab) *cepheid single result test* Anterior Nasal Swab      Status: None   Collection Time: 06/14/22  3:44 AM   Specimen: Anterior Nasal Swab  Result Value Ref Range Status   SARS Coronavirus 2 by RT PCR NEGATIVE NEGATIVE Final    Comment: Performed at [facility], [address] [place], [place] [address]         Radiology Studies: DG Chest Port 1 View  Result Date: 06/14/2022 CLINICAL DATA:  Respiratory distress EXAM: PORTABLE CHEST 1 VIEW COMPARISON:  05/03/2022 FINDINGS: Multiple remote bilateral rib fractures are identified with resultant left chest wall deformity and left-sided volume loss. Stable left pleural thickening. Lungs are clear. No pneumothorax or pleural effusion. Cardiac size within normal limits. Pulmonary vascularity is normal. No acute bone abnormality.  IMPRESSION: 1. No active disease. 2. Multiple remote bilateral rib fractures. Electronically Signed   By: Helyn Numbers M.D.   On: 06/14/2022 01:39           LOS: 0 days   Time spent= 35 mins    Alain Deschene Joline Maxcy, MD [facility]  If 7PM-7AM, please contact night-coverage  06/14/2022, 8:26 AM

## 2022-06-14 NOTE — Assessment & Plan Note (Addendum)
COPD pathway PRN and scheduled nebs Solumedrol Rocephin Cont pulse ox Check COVID test

## 2022-06-14 NOTE — Assessment & Plan Note (Signed)
Med rec pending. Pt NPO at the moment due to BIPAP.

## 2022-06-15 ENCOUNTER — Telehealth: Payer: Self-pay

## 2022-06-15 ENCOUNTER — Other Ambulatory Visit: Payer: Self-pay

## 2022-06-15 ENCOUNTER — Other Ambulatory Visit (HOSPITAL_COMMUNITY): Payer: Self-pay

## 2022-06-15 DIAGNOSIS — J9621 Acute and chronic respiratory failure with hypoxia: Secondary | ICD-10-CM | POA: Diagnosis not present

## 2022-06-15 DIAGNOSIS — J9622 Acute and chronic respiratory failure with hypercapnia: Secondary | ICD-10-CM | POA: Diagnosis not present

## 2022-06-15 LAB — BASIC METABOLIC PANEL
Anion gap: 8 (ref 5–15)
BUN: 31 mg/dL — ABNORMAL HIGH (ref 8–23)
CO2: 27 mmol/L (ref 22–32)
Calcium: 9.1 mg/dL (ref 8.9–10.3)
Chloride: 99 mmol/L (ref 98–111)
Creatinine, Ser: 1.13 mg/dL (ref 0.61–1.24)
GFR, Estimated: >60 mL/min (ref >60)
Glucose, Bld: 184 mg/dL — ABNORMAL HIGH (ref 70–99)
Potassium: 5.1 mmol/L (ref 3.5–5.1)
Sodium: 134 mmol/L — ABNORMAL LOW (ref 135–145)

## 2022-06-15 LAB — CBC
HCT: 35.3 % — ABNORMAL LOW (ref 39.0–52.0)
Hemoglobin: 11 g/dL — ABNORMAL LOW (ref 13.0–17.0)
MCH: 29.1 pg (ref 26.0–34.0)
MCHC: 31.2 g/dL (ref 30.0–36.0)
MCV: 93.4 fL (ref 80.0–100.0)
Platelets: 226 10*3/uL (ref 150–400)
RBC: 3.78 MIL/uL — ABNORMAL LOW (ref 4.22–5.81)
RDW: 14.8 % (ref 11.5–15.5)
WBC: 16.2 10*3/uL — ABNORMAL HIGH (ref 4.0–10.5)
nRBC: 0 % (ref 0.0–0.2)

## 2022-06-15 LAB — GLUCOSE, CAPILLARY
Glucose-Capillary: 193 mg/dL — ABNORMAL HIGH (ref 70–99)
Glucose-Capillary: 186 mg/dL — ABNORMAL HIGH (ref 70–99)
Glucose-Capillary: 153 mg/dL — ABNORMAL HIGH (ref 70–99)
Glucose-Capillary: 135 mg/dL — ABNORMAL HIGH (ref 70–99)

## 2022-06-15 LAB — MAGNESIUM: Magnesium: 2.8 mg/dL — ABNORMAL HIGH (ref 1.7–2.4)

## 2022-06-15 LAB — PROCALCITONIN: Procalcitonin: <0.1 ng/mL

## 2022-06-15 MED ORDER — INSULIN GLARGINE-YFGN 100 UNIT/ML ~~LOC~~ SOLN
5.0000 [IU] | Freq: Every day | SUBCUTANEOUS | Status: DC
  Administered 2022-06-15: 5 [IU] via SUBCUTANEOUS
  Filled 2022-06-15 (×2): qty 0.05

## 2022-06-15 MED ORDER — IPRATROPIUM-ALBUTEROL 0.5-2.5 (3) MG/3ML IN SOLN: 3.0000 mL | Freq: Three times a day (TID) | RESPIRATORY_TRACT | Status: DC

## 2022-06-15 MED ORDER — GLUCERNA SHAKE PO LIQD
237.0000 mL | Freq: Two times a day (BID) | ORAL | Status: DC
  Administered 2022-06-15 – 2022-06-21 (×12): 237 mL via ORAL

## 2022-06-15 MED ORDER — PANTOPRAZOLE SODIUM 40 MG PO TBEC
40.0000 mg | DELAYED_RELEASE_TABLET | Freq: Every day | ORAL | Status: DC
  Administered 2022-06-15 – 2022-06-21 (×7): 40 mg via ORAL
  Filled 2022-06-15 (×7): qty 1

## 2022-06-15 MED ORDER — INSULIN ASPART 100 UNIT/ML IJ SOLN
3.0000 [IU] | Freq: Three times a day (TID) | INTRAMUSCULAR | Status: DC
  Administered 2022-06-15 – 2022-06-21 (×17): 3 [IU] via SUBCUTANEOUS

## 2022-06-15 MED ORDER — METOPROLOL SUCCINATE ER 50 MG PO TB24
50.0000 mg | ORAL_TABLET | Freq: Every day | ORAL | Status: DC
  Administered 2022-06-15 – 2022-06-21 (×7): 50 mg via ORAL
  Filled 2022-06-15 (×7): qty 1

## 2022-06-15 MED ORDER — PANTOPRAZOLE SODIUM 40 MG PO TBEC: 40.0000 mg | DELAYED_RELEASE_TABLET | Freq: Every day | ORAL | Status: DC

## 2022-06-15 MED ORDER — IPRATROPIUM-ALBUTEROL 0.5-2.5 (3) MG/3ML IN SOLN
3.0000 mL | Freq: Three times a day (TID) | RESPIRATORY_TRACT | Status: DC
  Administered 2022-06-15 – 2022-06-18 (×8): 3 mL via RESPIRATORY_TRACT
  Filled 2022-06-15: qty 3
  Filled 2022-06-15: qty 30
  Filled 2022-06-15 (×6): qty 3

## 2022-06-15 MED ORDER — ATORVASTATIN CALCIUM 10 MG PO TABS
10.0000 mg | ORAL_TABLET | Freq: Every day | ORAL | Status: DC
  Administered 2022-06-15 – 2022-06-21 (×7): 10 mg via ORAL
  Filled 2022-06-15 (×7): qty 1

## 2022-06-15 MED ORDER — PREGABALIN 50 MG PO CAPS
100.0000 mg | ORAL_CAPSULE | Freq: Two times a day (BID) | ORAL | Status: DC
  Administered 2022-06-15 – 2022-06-21 (×13): 100 mg via ORAL
  Filled 2022-06-15 (×13): qty 2

## 2022-06-15 NOTE — Progress Notes (Signed)
Initial Nutrition Assessment  DOCUMENTATION CODES:   Not applicable  INTERVENTION:   - Glucerna Shake po BID, each supplement provides 220 kcal and 10 grams of protein  - Continue MVI with minerals, thiamine, folic acid  NUTRITION DIAGNOSIS:   Increased nutrient needs related to chronic illness as evidenced by estimated needs.  GOAL:   Patient will meet greater than or equal to 90% of their needs  MONITOR:   Supplement acceptance, PO intake, Weight trends, Labs  REASON FOR ASSESSMENT:   Consult COPD Protocol  ASSESSMENT:   62 year old male who presented to the ED on 6/10 with SOB. PMH of COPD, recurrent PNA, EtOH abuse, T2DM, HTN. Pt admitted with COPD exacerbation, AKI.  Attempted to speak with pt at bedside but pt unavailable at time of RD visit.  Pt with good PO intake documented. Pt consumed 100% of breakfast meal this morning.  Reviewed weight history in chart. Pt's weight has fluctuated between 93-100 kg over the last 6 months with no real trends noted.  Given increased nutrient needs related to COPD, RD to order oral nutrition supplements to aid pt in meeting kcal and protein needs.  Meal Completion: 100% x 1 documented meal  Medications reviewed and include: folic acid, SSI, novolog 3 units TID with meals, semglee 5 units daily, IV solu-medrol, MVI with minerals, protonix, thiamine, IV abx  Labs reviewed: sodium 134, BUN 31, magnesium 2.8, WBC 16.2, hemoglobin A1C 6.2 CBG's: 177-193 x 24 hours  NUTRITION - FOCUSED PHYSICAL EXAM:  Unable to complete at this time.  Diet Order:   Diet Order             Diet Carb Modified Fluid consistency: Thin; Room service appropriate? Yes  Diet effective now                   EDUCATION NEEDS:   No education needs have been identified at this time  Skin:  Skin Assessment: Reviewed RN Assessment  Last BM:  06/13/22  Height:   Ht Readings from Last 1 Encounters:  06/14/22 5\' 9"  (1.753 m)    Weight:    Wt Readings from Last 1 Encounters:  06/14/22 96.6 kg    BMI:  Body mass index is 31.45 kg/m.  Estimated Nutritional Needs:   Kcal:  2100-2300  Protein:  110-130 grams  Fluid:  >2.0 L    Mertie Clause, MS, RD, LDN Inpatient Clinical Dietitian Please see AMiON for contact information.

## 2022-06-15 NOTE — Telephone Encounter (Signed)
-----   Message from [name], MD sent at 06/10/2022  5:51 AM EDT ----- Let pt know kidney normal

## 2022-06-15 NOTE — Evaluation (Signed)
Occupational Therapy Evaluation Patient Details Name: James [name] MRN: [id number] DOB: Dec 10, 1960 Today's Date: 06/15/2022   History of Present Illness 62 y.o. male presents to [facility] hospital on 06/14/2022 with respiratory distress. PMHx:  recurrent PNA, COPD, Covid, DM, HTN, ETOH abuse.   Clinical Impression   Pt reports independence at baseline with ADLs and functional mobility, lives with roommate who cannot assist at d/c. Pt reports he utilizes transportation services and does not drive, wears 2L O2 baseline. Pt currently needing set up -min A for ADLs, mod I for bed mobility, and supervision for transfers without AD. SpO2 down to 88% on 4L O2 with poor wave pleth, mostly in 90's throughout. Pt taking x2 standing rest breaks with ambulation, cues for pursed lip breathing. Pt presenting with impairments listed below, will follow acutely. Anticipate no OT follow up needs at d/c pending progression.     Recommendations for follow up therapy are one component of a multi-disciplinary discharge planning process, led by the attending physician.  Recommendations may be updated based on patient status, additional functional criteria and insurance authorization.   Assistance Recommended at Discharge Set up Supervision/Assistance  Patient can return home with the following A little help with bathing/dressing/bathroom;Assistance with cooking/housework;Assist for transportation;Help with stairs or ramp for entrance    Functional Status Assessment  Patient has had a recent decline in their functional status and demonstrates the ability to make significant improvements in function in a reasonable and predictable amount of time.  Equipment Recommendations  Tub/shower seat    Recommendations for Other Services PT consult     Precautions / Restrictions Precautions Precautions: Other (comment) Precaution Comments: watch O2/HR Restrictions Weight Bearing Restrictions: No      Mobility Bed  Mobility Overal bed mobility: Modified Independent                  Transfers Overall transfer level: Needs assistance Equipment used: None Transfers: Sit to/from Stand Sit to Stand: Supervision                  Balance Overall balance assessment: No apparent balance deficits (not formally assessed)                                         ADL either performed or assessed with clinical judgement   ADL Overall ADL's : Needs assistance/impaired Eating/Feeding: Set up;Sitting   Grooming: Set up;Standing   Upper Body Bathing: Min guard;Sitting   Lower Body Bathing: Minimal assistance;Sitting/lateral leans   Upper Body Dressing : Minimal assistance   Lower Body Dressing: Minimal assistance   Toilet Transfer: Ambulation;Regular Toilet;Supervision/safety   Toileting- Clothing Manipulation and Hygiene: Min guard       Functional mobility during ADLs: Min guard       Vision   Vision Assessment?: No apparent visual deficits     Perception Perception Perception Tested?: No   Praxis Praxis Praxis tested?: Not tested    Pertinent Vitals/Pain Pain Assessment Pain Assessment: No/denies pain     Hand Dominance Right   Extremity/Trunk Assessment Upper Extremity Assessment Upper Extremity Assessment: Overall WFL for tasks assessed   Lower Extremity Assessment Lower Extremity Assessment: Defer to PT evaluation   Cervical / Trunk Assessment Cervical / Trunk Assessment: Normal   Communication Communication Communication: HOH   Cognition Arousal/Alertness: Awake/alert Behavior During Therapy: WFL for tasks assessed/performed Overall Cognitive Status: Within Functional Limits for tasks  assessed                                       General Comments  SpO2 88% at lowest on 4L O2  during session with poor wave form    Exercises     Shoulder Instructions      Home Living Family/patient expects to be discharged to::  Private residence Living Arrangements: Non-relatives/Friends (roommate) Available Help at Discharge: Friend(s);Available PRN/intermittently Type of Home: House Home Access: Stairs to enter [employer] of Steps: 2 Entrance Stairs-Rails: None Home Layout: One level     Bathroom Shower/Tub: Chief Strategy Officer: Standard Bathroom Accessibility: Yes   Home Equipment: None   Additional Comments: wears 2L O2 baseline      Prior Functioning/Environment Prior Level of Function : Independent/Modified Independent             Mobility Comments: no AD use ADLs Comments: ind with ADLs; utilizes trnsportation services        OT Problem List: Decreased activity tolerance;Decreased strength;Cardiopulmonary status limiting activity;Decreased safety awareness      OT Treatment/Interventions: Therapeutic exercise;Self-care/ADL training;Energy conservation;DME and/or AE instruction;Therapeutic activities;Patient/family education;Balance training    OT Goals(Current goals can be found in the care plan section) Acute Rehab OT Goals Patient Stated Goal: none stated OT Goal Formulation: With patient Time For Goal Achievement: 06/29/22 Potential to Achieve Goals: Good ADL Goals Pt Will Perform Upper Body Dressing: with modified independence;sitting Pt Will Perform Lower Body Dressing: with modified independence;sitting/lateral leans;sit to/from stand Pt Will Transfer to Toilet: with modified independence;ambulating;regular height toilet Additional ADL Goal #1: pt will verbalize x3 energy conservation strategies in prep for ADLs  OT Frequency: Min 1X/week    Co-evaluation              AM-PAC OT "6 Clicks" Daily Activity     Outcome Measure Help from another person eating meals?: None Help from another person taking care of personal grooming?: A Little Help from another person toileting, which includes using toliet, bedpan, or urinal?: A Little Help from  another person bathing (including washing, rinsing, drying)?: A Little Help from another person to put on and taking off regular upper body clothing?: A Little Help from another person to put on and taking off regular lower body clothing?: A Little 6 Click Score: 19   End of Session Equipment Utilized During Treatment: Gait belt;Oxygen (4L) Nurse Communication: Mobility status (SpO2 levels on 4L, RT in room at end of session)  Activity Tolerance: Patient tolerated treatment well Patient left: in chair;with call bell/phone within reach;with chair alarm set  OT Visit Diagnosis: Unsteadiness on feet (R26.81);Other abnormalities of gait and mobility (R26.89);Muscle weakness (generalized) (M62.81)                Time: 4098-1191 OT Time Calculation (min): 32 min Charges:  OT General Charges $OT Visit: 1 Visit OT Evaluation $OT Eval Moderate Complexity: 1 Mod OT Treatments $Therapeutic Activity: 8-22 mins  Carver Fila, OTD, OTR/L SecureChat Preferred Acute Rehab [phone]   Carver Fila Koonce 06/15/2022, 9:29 AM

## 2022-06-15 NOTE — Telephone Encounter (Signed)
Pt was called and is aware of results, DOB was confirmed.  ?

## 2022-06-15 NOTE — Progress Notes (Signed)
PROGRESS NOTE    James [name]  ZOX:[id number] DOB: 08/08/1960 DOA: 06/14/2022 PCP: [name], MD   Brief Narrative:  62 year old with history of COPD, chronic hypoxia/hypercapnic respiratory failure, chronically on 5 L nasal cannula, ETOh Use, recurrent pneumonia, alcohol use in remission, DM2, HTN presented to the ED with respiratory distress.  Upon admission noted to be in hypoxic and hypercapnic respiratory failure requiring BiPAP.  Chest x-ray was negative besides multiple remote bilateral rib fractures.  Slowly being weaned off BiPAP getting aggressive bronchodilators, steroids.  Also showing some signs of alcohol withdrawal on CIWA protocol and Librium taper.  Received IV fluids for AKI.   Assessment & Plan:  Principal Problem:   Acute on chronic respiratory failure with hypoxia and hypercapnia (HCC) Active Problems:   COPD with acute exacerbation (HCC)   COPD mixed type (HCC)   Hypertension   Type 2 diabetes mellitus without complication, without long-term current use of insulin (HCC)   Multiple lung nodules on CT     Assessment and Plan: * Acute on chronic respiratory failure with hypoxia and hypercapnia (HCC) Acute COPD exacerbation -Unfortunately patient continues to use tobacco. Cont Steroids, Nebs sch & prn, IS/Flutter. -BNP negative.  Check procalcitonin; if neg we can likely Stop Roc. Will opt to finish 5 day of Azithromycin.  - COVID-negative  Acute kidney injury, improving - Baseline creatinine 1.0, admission creatinine 1.36 > 1.13.  Continue to monitor.  ETOH use -daily use. Librium taper. CIWA.   Multiple lung nodules on CT Multiple pulm nodules seen June 2023 had completely resolved on repeat CT in Feb 2024.  Suspicion now is that they were due to an infectious cause that resolved and not neoplastic.  No mention of pulm nodules on the Feb 2024 scan.  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  Sliding scale and Accu-Cheks. Holding home PO meds.  Semglee 5U and Novolog 3U premeal added. Adjust prn.  Continue Lyrica  GERD - PPI  Hypertension Hold Losartan and Lasix until renal Fx better.  Cont BB. IV prn meds.     DVT prophylaxis: Lovenox Code Status: Full code Family Communication:   Status is: Inpatient Continue hospital stay.       Diet Orders (From admission, onward)     Start     Ordered   06/14/22 1103  Diet Carb Modified Fluid consistency: Thin; Room service appropriate? Yes  Diet effective now       Question Answer Comment  Diet-HS Snack? Nothing   Calorie Level Medium 1600-2000   Fluid consistency: Thin   Room service appropriate? Yes      06/14/22 1102            Subjective:  Still has quite a bit of respiratory distress with minimal exertion  Examination: Constitutional: Not in acute distress Respiratory: Bilateral diffuse diminished breath sounds with expiratory wheezing Cardiovascular: Normal sinus rhythm, no rubs Abdomen: Nontender nondistended good bowel sounds Musculoskeletal: No edema noted Skin: No rashes seen Neurologic: CN 2-12 grossly intact.  And nonfocal Psychiatric: Normal judgment and insight. Alert and oriented x 3. Normal mood.   Objective: Vitals:   06/14/22 1957 06/14/22 2352 06/15/22 0354 06/15/22 0727  BP:  (!) 146/76 135/82 (!) 152/109  Pulse:  97 86 (!) 111  Resp:  17 14 (!) 21  Temp:  98.4 F (36.9 C) 97.9 F (36.6 C) 99 F (37.2 C)  TempSrc:  Oral Oral Oral  SpO2: 96% 92% 98%   Weight:      Height:        Intake/Output Summary (Last 24 hours) at 06/15/2022 0740 Last data filed at 06/15/2022 0300 Gross per 24 hour  Intake 382.2 ml  Output 900 ml  Net -517.8 ml   Filed Weights   06/14/22 0733 06/14/22 1752  Weight: 100.2 kg 96.6 kg    Scheduled Meds:  budesonide (PULMICORT) nebulizer solution  0.5 mg Nebulization BID    chlordiazePOXIDE  25 mg Oral BID   Followed by   Melene Muller ON 06/16/2022] chlordiazePOXIDE  25 mg Oral Q1500   enoxaparin (LOVENOX) injection  40 mg Subcutaneous Q24H   folic acid  1 mg Oral Daily   insulin aspart  0-15 Units Subcutaneous TID WC   insulin aspart  0-5 Units Subcutaneous QHS   ipratropium-albuterol  3 mL Nebulization BID   methylPREDNISolone (SOLU-MEDROL) injection  40 mg Intravenous Q12H   multivitamin with minerals  1 tablet Oral Daily   thiamine  100 mg Oral Daily   Or   thiamine  100 mg Intravenous Daily   Continuous Infusions:  sodium chloride Stopped (06/15/22 0711)   azithromycin Stopped (06/14/22 1348)   cefTRIAXone (ROCEPHIN)  IV Stopped (06/14/22 0528)    Nutritional status     Body mass index is 31.45 kg/m.  Data Reviewed:   CBC: Recent Labs  Lab 06/14/22 0118 06/14/22 0136 06/14/22 0348 06/15/22 0013  WBC 13.9*  --   --  16.2*  NEUTROABS 9.4*  --   --   --   HGB 12.8* 13.9 13.9 11.0*  HCT 40.9 41.0 41.0 35.3*  MCV 94.2  --   --  93.4  PLT 316  --   --  226   Basic Metabolic Panel: Recent Labs  Lab 06/09/22 1038 06/14/22 0118 06/14/22 0136 06/14/22 0348 06/15/22 0013  NA 142 134* 134* 135 134*  K 5.0 4.6 4.3 4.3 5.1  CL 102 98  --   --  99  CO2 25 25  --   --  27  GLUCOSE 114* 87  --   --  184*  BUN 26 22  --   --  31*  CREATININE 1.09 1.36*  --   --  1.13  CALCIUM 10.1 8.7*  --   --  9.1  MG  --   --   --   --  2.8*  PHOS 3.8  --   --   --   --    GFR: Estimated Creatinine Clearance: 78.7 mL/min (by C-G formula based on SCr of 1.13 mg/dL). Liver Function Tests: Recent Labs  Lab 06/09/22 1038  ALBUMIN 4.5   No results for input(s): "LIPASE", "AMYLASE" in the last 168 hours. No results for input(s): "AMMONIA" in the last 168 hours. Coagulation Profile: No results for input(s): "INR", "PROTIME" in the last 168 hours. Cardiac Enzymes: No results for input(s): "CKTOTAL", "CKMB", "CKMBINDEX", "TROPONINI" in the last 168  hours. BNP (last 3 results) No results for input(s): "PROBNP" in the last 8760 hours. HbA1C: Recent Labs    06/14/22 1853  HGBA1C 6.2*   CBG: Recent Labs  Lab 06/14/22 0803 06/14/22 1215 06/14/22 1601 06/14/22 2116 06/15/22 0622  GLUCAP 240* 184* 177* 193* 193*   Lipid Profile: No results for input(s): "CHOL", "HDL", "LDLCALC", "TRIG", "CHOLHDL", "LDLDIRECT" in the last 72 hours. Thyroid Function Tests: No results for input(s): "TSH", "T4TOTAL", "FREET4", "T3FREE", "THYROIDAB" in the last 72  hours. Anemia Panel: No results for input(s): "VITAMINB12", "FOLATE", "FERRITIN", "TIBC", "IRON", "RETICCTPCT" in the last 72 hours. Sepsis Labs: No results for input(s): "PROCALCITON", "LATICACIDVEN" in the last 168 hours.  Recent Results (from the past 240 hour(s))  SARS Coronavirus 2 by RT PCR (hospital order, performed in [facility] hospital lab) *cepheid single result test* Anterior Nasal Swab     Status: None   Collection Time: 06/14/22  3:44 AM   Specimen: Anterior Nasal Swab  Result Value Ref Range Status   SARS Coronavirus 2 by RT PCR NEGATIVE NEGATIVE Final    Comment: Performed at [facility], [address] [place], [place] [address]         Radiology Studies: DG Chest Port 1 View  Result Date: 06/14/2022 CLINICAL DATA:  Respiratory distress EXAM: PORTABLE CHEST 1 VIEW COMPARISON:  05/03/2022 FINDINGS: Multiple remote bilateral rib fractures are identified with resultant left chest wall deformity and left-sided volume loss. Stable left pleural thickening. Lungs are clear. No pneumothorax or pleural effusion. Cardiac size within normal limits. Pulmonary vascularity is normal. No acute bone abnormality. IMPRESSION: 1. No active disease. 2. Multiple remote bilateral rib fractures. Electronically Signed   By: Helyn Numbers M.D.   On: 06/14/2022 01:39           LOS: 1 day   Time spent= 35 mins    Lorane Cousar Joline Maxcy, MD [facility]  If 7PM-7AM,  please contact night-coverage  06/15/2022, 7:40 AM

## 2022-06-16 DIAGNOSIS — J9621 Acute and chronic respiratory failure with hypoxia: Secondary | ICD-10-CM | POA: Diagnosis not present

## 2022-06-16 DIAGNOSIS — J9622 Acute and chronic respiratory failure with hypercapnia: Secondary | ICD-10-CM | POA: Diagnosis not present

## 2022-06-16 LAB — BASIC METABOLIC PANEL
Anion gap: 8 (ref 5–15)
BUN: 35 mg/dL — ABNORMAL HIGH (ref 8–23)
CO2: 29 mmol/L (ref 22–32)
Calcium: 8.9 mg/dL (ref 8.9–10.3)
Chloride: 99 mmol/L (ref 98–111)
Creatinine, Ser: 1.23 mg/dL (ref 0.61–1.24)
GFR, Estimated: >60 mL/min (ref >60)
Glucose, Bld: 174 mg/dL — ABNORMAL HIGH (ref 70–99)
Potassium: 4.9 mmol/L (ref 3.5–5.1)
Sodium: 136 mmol/L (ref 135–145)

## 2022-06-16 LAB — GLUCOSE, CAPILLARY
Glucose-Capillary: 166 mg/dL — ABNORMAL HIGH (ref 70–99)
Glucose-Capillary: 145 mg/dL — ABNORMAL HIGH (ref 70–99)
Glucose-Capillary: 193 mg/dL — ABNORMAL HIGH (ref 70–99)
Glucose-Capillary: 290 mg/dL — ABNORMAL HIGH (ref 70–99)

## 2022-06-16 LAB — CBC
HCT: 35.9 % — ABNORMAL LOW (ref 39.0–52.0)
Hemoglobin: 11.3 g/dL — ABNORMAL LOW (ref 13.0–17.0)
MCH: 30 pg (ref 26.0–34.0)
MCHC: 31.5 g/dL (ref 30.0–36.0)
MCV: 95.2 fL (ref 80.0–100.0)
Platelets: 236 10*3/uL (ref 150–400)
RBC: 3.77 MIL/uL — ABNORMAL LOW (ref 4.22–5.81)
RDW: 14.8 % (ref 11.5–15.5)
WBC: 13.2 10*3/uL — ABNORMAL HIGH (ref 4.0–10.5)
nRBC: 0 % (ref 0.0–0.2)

## 2022-06-16 LAB — MAGNESIUM: Magnesium: 2.5 mg/dL — ABNORMAL HIGH (ref 1.7–2.4)

## 2022-06-16 MED ORDER — INSULIN GLARGINE-YFGN 100 UNIT/ML ~~LOC~~ SOLN
8.0000 [IU] | Freq: Every day | SUBCUTANEOUS | Status: DC
  Administered 2022-06-16 – 2022-06-20 (×5): 8 [IU] via SUBCUTANEOUS
  Filled 2022-06-16 (×6): qty 0.08

## 2022-06-16 NOTE — Progress Notes (Signed)
PROGRESS NOTE    James [name]  ZOX:[id number] DOB: [birth year] DOA: 06/14/2022 PCP: [name], MD   Brief Narrative:  62 year old with history of COPD, chronic hypoxia/hypercapnic respiratory failure, chronically on 5 L nasal cannula, ETOh Use, recurrent pneumonia, alcohol use in remission, DM2, HTN presented to the ED with respiratory distress.  Upon admission noted to be in hypoxic and hypercapnic respiratory failure requiring BiPAP.  Chest x-ray was negative besides multiple remote bilateral rib fractures.  Slowly being weaned off BiPAP getting aggressive bronchodilators, steroids.  Also showing some signs of alcohol withdrawal on CIWA protocol and Librium taper.  Received IV fluids for AKI.   Assessment & Plan:  Principal Problem:   Acute on chronic respiratory failure with hypoxia and hypercapnia (HCC) Active Problems:   COPD with acute exacerbation (HCC)   COPD mixed type (HCC)   Hypertension   Type 2 diabetes mellitus without complication, without long-term current use of insulin (HCC)   Multiple lung nodules on CT     Assessment and Plan: * Acute on chronic respiratory failure with hypoxia and hypercapnia (HCC) Acute COPD exacerbation -Unfortunately patient continues to use tobacco. Cont Steroids, Nebs sch & prn, IS/Flutter. -BNP negative.  Procalcitonin negative, discontinue Rocephin.  Complete total 5-day course of azithromycin - COVID-negative  Acute kidney injury, improving - Baseline creatinine 1.0, admission creatinine 1.36 now improving slowly  ETOH use -daily use. Librium taper. CIWA.   Multiple lung nodules on CT Multiple pulm nodules seen June 2023 had completely resolved on repeat CT in Feb 2024.  Suspicion now is that they were due to an infectious cause that resolved and not neoplastic.  No mention of pulm nodules on the Feb 2024 scan.  Type 2 diabetes mellitus without complication, without long-term current use of insulin (HCC) Peripheral  neuropathy secondary to DM 2 Diabetic diet.  Sliding scale and Accu-Cheks. Holding home PO meds.  Increase Semglee 8U and Novolog 3U premeal added. Adjust prn.  Continue Lyrica  GERD - PPI  Hypertension Hold Losartan and Lasix until renal Fx better.  Cont BB. IV prn meds.     DVT prophylaxis: Lovenox Code Status: Full code Family Communication:   Status is: Inpatient Continue hospital stay.  Significant abnormal breath sounds       Diet Orders (From admission, onward)     Start     Ordered   06/14/22 1103  Diet Carb Modified Fluid consistency: Thin; Room service appropriate? Yes  Diet effective now       Question Answer Comment  Diet-HS Snack? Nothing   Calorie Level Medium 1600-2000   Fluid consistency: Thin   Room service appropriate? Yes      06/14/22 1102            Subjective: Seen and examined at bedside.  No complaints besides some respiratory dyspnea on exertion Overall feeling little better  Examination: Constitutional: Not in acute distress Respiratory: Diminished breath sounds bilaterally with some improvement Cardiovascular: Normal sinus rhythm, no rubs Abdomen: Nontender nondistended good bowel sounds Musculoskeletal: No edema noted Skin: No rashes seen Neurologic: CN 2-12 grossly intact.  And nonfocal Psychiatric: Normal judgment and insight. Alert and oriented x 3. Normal mood.   Objective: Vitals:   06/15/22 2035 06/15/22 2200 06/15/22 2323 06/16/22 0300  BP:   (!) 149/91 131/77  Pulse:   84 66  Resp:   20 17  Temp:   98 F (36.7 C) 98 F (36.7 C)  TempSrc:   Oral Oral  SpO2: 95% 97% 98% 94%  Weight:      Height:        Intake/Output Summary (Last 24 hours) at 06/16/2022 0747 Last data filed at 06/16/2022 0600 Gross per 24 hour  Intake 348 ml  Output 600 ml  Net -252 ml   Filed Weights   06/14/22 0733 06/14/22 1752  Weight: 100.2 kg 96.6 kg    Scheduled Meds:  atorvastatin  10 mg Oral Daily   budesonide (PULMICORT)  nebulizer solution  0.5 mg Nebulization BID   chlordiazePOXIDE  25 mg Oral Q1500   enoxaparin (LOVENOX) injection  40 mg Subcutaneous Q24H   feeding supplement (GLUCERNA SHAKE)  237 mL Oral BID BM   folic acid  1 mg Oral Daily   insulin aspart  0-15 Units Subcutaneous TID WC   insulin aspart  0-5 Units Subcutaneous QHS   insulin aspart  3 Units Subcutaneous TID WC   insulin glargine-yfgn  5 Units Subcutaneous Daily   ipratropium-albuterol  3 mL Nebulization TID   methylPREDNISolone (SOLU-MEDROL) injection  40 mg Intravenous Q12H   metoprolol succinate  50 mg Oral Daily   multivitamin with minerals  1 tablet Oral Daily   pantoprazole  40 mg Oral QAC breakfast   pregabalin  100 mg Oral BID   thiamine  100 mg Oral Daily   Or   thiamine  100 mg Intravenous Daily   Continuous Infusions:  azithromycin 250 mg (06/16/22 0737)   cefTRIAXone (ROCEPHIN)  IV Stopped (06/15/22 0850)    Nutritional status Signs/Symptoms: estimated needs Interventions: Glucerna shake, MVI Body mass index is 31.45 kg/m.  Data Reviewed:   CBC: Recent Labs  Lab 06/14/22 0118 06/14/22 0136 06/14/22 0348 06/15/22 0013 06/15/22 2352  WBC 13.9*  --   --  16.2* 13.2*  NEUTROABS 9.4*  --   --   --   --   HGB 12.8* 13.9 13.9 11.0* 11.3*  HCT 40.9 41.0 41.0 35.3* 35.9*  MCV 94.2  --   --  93.4 95.2  PLT 316  --   --  226 236   Basic Metabolic Panel: Recent Labs  Lab 06/09/22 1038 06/14/22 0118 06/14/22 0136 06/14/22 0348 06/15/22 0013 06/15/22 2352  NA 142 134* 134* 135 134* 136  K 5.0 4.6 4.3 4.3 5.1 4.9  CL 102 98  --   --  99 99  CO2 25 25  --   --  27 29  GLUCOSE 114* 87  --   --  184* 174*  BUN 26 22  --   --  31* 35*  CREATININE 1.09 1.36*  --   --  1.13 1.23  CALCIUM 10.1 8.7*  --   --  9.1 8.9  MG  --   --   --   --  2.8* 2.5*  PHOS 3.8  --   --   --   --   --    GFR: Estimated Creatinine Clearance: 72.3 mL/min (by C-G formula based on SCr of 1.23 mg/dL). Liver Function  Tests: Recent Labs  Lab 06/09/22 1038  ALBUMIN 4.5   No results for input(s): "LIPASE", "AMYLASE" in the last 168 hours. No results for input(s): "AMMONIA" in the last 168 hours. Coagulation Profile: No results for input(s): "INR", "PROTIME" in the last 168 hours. Cardiac Enzymes: No results for input(s): "CKTOTAL", "CKMB", "CKMBINDEX", "TROPONINI" in the last 168 hours. BNP (last 3 results) No results for input(s): "PROBNP" in the last 8760 hours. HbA1C: Recent Labs  06/14/22 1853  HGBA1C 6.2*   CBG: Recent Labs  Lab 06/15/22 0622 06/15/22 1053 06/15/22 1614 06/15/22 2113 06/16/22 0628  GLUCAP 193* 186* 153* 135* 166*   Lipid Profile: No results for input(s): "CHOL", "HDL", "LDLCALC", "TRIG", "CHOLHDL", "LDLDIRECT" in the last 72 hours. Thyroid Function Tests: No results for input(s): "TSH", "T4TOTAL", "FREET4", "T3FREE", "THYROIDAB" in the last 72 hours. Anemia Panel: No results for input(s): "VITAMINB12", "FOLATE", "FERRITIN", "TIBC", "IRON", "RETICCTPCT" in the last 72 hours. Sepsis Labs: Recent Labs  Lab 06/15/22 0009  PROCALCITON <0.10    Recent Results (from the past 240 hour(s))  SARS Coronavirus 2 by RT PCR (hospital order, performed in [facility] hospital lab) *cepheid single result test* Anterior Nasal Swab     Status: None   Collection Time: 06/14/22  3:44 AM   Specimen: Anterior Nasal Swab  Result Value Ref Range Status   SARS Coronavirus 2 by RT PCR NEGATIVE NEGATIVE Final    Comment: Performed at [facility], [address], [place], [place] [address]         Radiology Studies: No results found.         LOS: 2 days   Time spent= 35 mins     Smeal Joline Maxcy, MD [facility]  If 7PM-7AM, please contact night-coverage  06/16/2022, 7:47 AM

## 2022-06-17 DIAGNOSIS — J9621 Acute and chronic respiratory failure with hypoxia: Secondary | ICD-10-CM | POA: Diagnosis not present

## 2022-06-17 DIAGNOSIS — F79 Unspecified intellectual disabilities: Secondary | ICD-10-CM | POA: Diagnosis not present

## 2022-06-17 DIAGNOSIS — J9601 Acute respiratory failure with hypoxia: Secondary | ICD-10-CM | POA: Diagnosis not present

## 2022-06-17 DIAGNOSIS — J9622 Acute and chronic respiratory failure with hypercapnia: Secondary | ICD-10-CM | POA: Diagnosis not present

## 2022-06-17 LAB — BASIC METABOLIC PANEL
Anion gap: 8 (ref 5–15)
BUN: 34 mg/dL — ABNORMAL HIGH (ref 8–23)
CO2: 31 mmol/L (ref 22–32)
Calcium: 9.8 mg/dL (ref 8.9–10.3)
Chloride: 98 mmol/L (ref 98–111)
Creatinine, Ser: 1.02 mg/dL (ref 0.61–1.24)
GFR, Estimated: >60 mL/min (ref >60)
Glucose, Bld: 139 mg/dL — ABNORMAL HIGH (ref 70–99)
Potassium: 4.8 mmol/L (ref 3.5–5.1)
Sodium: 137 mmol/L (ref 135–145)

## 2022-06-17 LAB — MAGNESIUM: Magnesium: 2.3 mg/dL (ref 1.7–2.4)

## 2022-06-17 LAB — CBC
HCT: 38.3 % — ABNORMAL LOW (ref 39.0–52.0)
Hemoglobin: 11.9 g/dL — ABNORMAL LOW (ref 13.0–17.0)
MCH: 29.7 pg (ref 26.0–34.0)
MCHC: 31.1 g/dL (ref 30.0–36.0)
MCV: 95.5 fL (ref 80.0–100.0)
Platelets: 234 10*3/uL (ref 150–400)
RBC: 4.01 MIL/uL — ABNORMAL LOW (ref 4.22–5.81)
RDW: 14.7 % (ref 11.5–15.5)
WBC: 13.2 10*3/uL — ABNORMAL HIGH (ref 4.0–10.5)
nRBC: 0 % (ref 0.0–0.2)

## 2022-06-17 LAB — GLUCOSE, CAPILLARY
Glucose-Capillary: 166 mg/dL — ABNORMAL HIGH (ref 70–99)
Glucose-Capillary: 223 mg/dL — ABNORMAL HIGH (ref 70–99)
Glucose-Capillary: 177 mg/dL — ABNORMAL HIGH (ref 70–99)
Glucose-Capillary: 147 mg/dL — ABNORMAL HIGH (ref 70–99)

## 2022-06-17 NOTE — TOC Initial Note (Signed)
Transition of Care [facility]) - Initial/Assessment Note    Patient Details  Name: James [name] MRN: [id number] Date of Birth: [birth year]  Transition of [name]) CM/SW Contact:    Harriet Masson, RN Phone Number: 06/17/2022, 1:36 PM  Clinical Narrative:                 Spoke patient regarding transition needs.  Patient lives with a friend and has home 02 3L with [employer]. Patient will need portable tank at discharge, notified [employer]. Has transportation to apts from medicaid. Sister can transport home.  PCP confirmed.   Expected Discharge Plan: Home/Self Care Barriers to Discharge: Continued Medical Work up   Patient Goals and CMS Choice Patient states their goals for this hospitalization and ongoing recovery are:: return home          Expected Discharge Plan and Services       Living arrangements for the past 2 months: Single Family Home                                      Prior Living Arrangements/Services Living arrangements for the past 2 months: Single Family Home Lives with:: Friends Patient language and need for interpreter reviewed:: Yes Do you feel safe going back to the place where you live?: Yes      Need for Family Participation in Patient Care: Yes (Comment) Care giver support system in place?: Yes (comment) Current home services: DME (portable tank) Criminal Activity/Legal Involvement Pertinent to Current Situation/Hospitalization: No - Comment as needed  Activities of Daily Living      Permission Sought/Granted                  Emotional Assessment Appearance:: Appears older than stated age Attitude/Demeanor/Rapport: Engaged Affect (typically observed): Accepting Orientation: : Oriented to Self, Oriented to Place, Oriented to  Time, Oriented to Situation Alcohol / Substance Use: Not Applicable Psych Involvement: No (comment)  Admission diagnosis:  COPD exacerbation (HCC) [J44.1] COPD with acute exacerbation (HCC)  [J44.1] Patient Active Problem List   Diagnosis Date Noted   Alcohol dependence in remission (HCC) 03/02/2022   Aortic root dilation (HCC) 03/02/2022   Vision changes 12/29/2021   Oropharyngeal dysphagia 12/29/2021   Onychomycosis 12/29/2021   Malnutrition of moderate degree 12/05/2021   Tinea cruris 07/21/2021   Multiple lung nodules on CT 06/11/2021   COPD with chronic bronchitis 02/01/2021   GERD (gastroesophageal reflux disease) 02/01/2021   Neuropathy 09/29/2020   COPD with acute exacerbation (HCC) 03/26/2020   Bronchiectasis (HCC) 09/24/2019   Type 2 diabetes mellitus without complication, without long-term current use of insulin (HCC) 07/24/2019   Other atopic dermatitis 05/01/2019   Chronic respiratory failure with hypoxia (HCC) 04/23/2019   Intellectual disability 04/23/2019   Hypertension 05/17/2016   Former tobacco use 04/06/2016   COPD mixed type (HCC) 03/26/2016   Acute on chronic respiratory failure with hypoxia and hypercapnia (HCC) 06/08/2013   PCP:  [name], MD Pharmacy:   [facility] [address], [address] [place] [address] Phone: [phone] Fax: [phone]     Social Determinants of Health (SDOH) Social History: SDOH Screenings   Food Insecurity: No Food Insecurity (04/19/2022)  Housing: Low Risk  (05/25/2022)  Transportation Needs: Unmet Transportation Needs (05/25/2022)  Utilities: Not At Risk (04/19/2022)  Alcohol Screen: Low Risk  (09/09/2021)  Depression (PHQ2-9): Medium  Risk (12/29/2021)  Financial Resource Strain: Low Risk  (12/25/2020)  Recent Concern: Financial Resource Strain - High Risk (10/29/2020)  Physical Activity: Inactive (12/11/2020)  Social Connections: Moderately Integrated (11/13/2020)  Stress: No Stress Concern Present (12/25/2020)  Tobacco Use: Medium Risk (06/14/2022)   SDOH Interventions:     Readmission Risk Interventions    02/04/2021   12:44 PM  08/14/2020    9:59 AM 04/24/2020    1:03 PM  Readmission Risk Prevention Plan  Post Dischage Appt Complete    Medication Screening Complete    Transportation Screening Complete Complete Complete  PCP or Specialist Appt within 3-5 Days  Complete   Not Complete comments  patient already has an appointment schedules with Dr Delford Field   [facility] or Home Care Consult  Complete   Social Work Consult for Recovery Care Planning/Counseling  Complete   Palliative Care Screening  Not Applicable   Medication Review (RN Care Manager)  Complete   PCP or Specialist appointment within 3-5 days of discharge   Not Complete  PCP/Specialist Appt Not Complete comments   Pt had existing appt 5/23--this is first available per [facility]  [facility] or Home Care Consult   Complete  SW Recovery Care/Counseling Consult   Complete  Palliative Care Screening   Complete  Skilled Nursing Facility   Not Applicable

## 2022-06-17 NOTE — Progress Notes (Signed)
PROGRESS NOTE    James [name]  ZOX:[id number] DOB: 26-Jul-1960 DOA: 06/14/2022 PCP: [name], MD   Brief Narrative:  62 year old with history of COPD, chronic hypoxia/hypercapnic respiratory failure, chronically on 5 L nasal cannula, ETOh Use, recurrent pneumonia, alcohol use in remission, DM2, HTN presented to the ED with respiratory distress.  Upon admission noted to be in hypoxic and hypercapnic respiratory failure requiring BiPAP.  Chest x-ray was negative besides multiple remote bilateral rib fractures.  Slowly being weaned off BiPAP getting aggressive bronchodilators, steroids.  Also showing some signs of alcohol withdrawal on CIWA protocol and Librium taper.  Received IV fluids for AKI.  AKI has resolved.  06/17/2022: Slight improvement in respiratory symptoms reported.  Acute kidney injury has resolved.   Assessment & Plan:  Principal Problem:   Acute on chronic respiratory failure with hypoxia and hypercapnia (HCC) Active Problems:   COPD mixed type (HCC)   Hypertension   Type 2 diabetes mellitus without complication, without long-term current use of insulin (HCC)   COPD with acute exacerbation (HCC)   Multiple lung nodules on CT     Assessment and Plan: * Acute on chronic respiratory failure with hypoxia and hypercapnia (HCC) Acute COPD exacerbation -Unfortunately patient continues to use tobacco. Cont Steroids, Nebs sch & prn, IS/Flutter. -BNP negative.  Procalcitonin negative, discontinue Rocephin.  Complete total 5-day course of azithromycin - COVID-negative 06/17/2022: Mild improvement reported.  Continue current management.  Acute kidney injury, improving - Baseline creatinine 1.0, admission creatinine 1.36 now improving slowly  ETOH use -Librium taper.  -Continue CIWA protocol.     Multiple lung nodules on CT Multiple pulm nodules seen June 2023 had completely resolved on repeat CT in Feb 2024.  Suspicion now is that they were due to an infectious cause  that resolved and not neoplastic.  No mention of pulm nodules on the Feb 2024 scan.  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  Sliding scale and Accu-Cheks. Holding home PO meds.  Increase Semglee 8U and Novolog 3U premeal added. Adjust prn.  Continue Lyrica 06/17/2022: Monitor blood sugar closely.  GERD - PPI  Hypertension Hold Losartan and Lasix until renal Fx better.  Cont BB. IV prn meds.  06/17/2022: Continue to optimize.    DVT prophylaxis: Lovenox Code Status: Full code Family Communication:   Status is: Inpatient Continue hospital stay.  Significant abnormal breath sounds       Diet Orders (From admission, onward)     Start     Ordered   06/14/22 1103  Diet Carb Modified Fluid consistency: Thin; Room service appropriate? Yes  Diet effective now       Question Answer Comment  Diet-HS Snack? Nothing   Calorie Level Medium 1600-2000   Fluid consistency: Thin   Room service appropriate? Yes      06/14/22 1102            Subjective: Seen and examined at bedside.  No complaints besides some respiratory dyspnea on exertion Overall feeling little better  Examination: Constitutional: Patient is awake and alert.  Not in any distress.  No withdrawal symptoms noted.  Patient is obese. Respiratory: Very decreased air entry globally.   Cardiovascular: S1-S2. Abdomen: Obese, soft and nontender. Musculoskeletal: No edema noted Neurologic: Awake and alert.  Patient moves all extremities.  Objective: Vitals:   06/17/22 0500 06/17/22 0713 06/17/22 0742 06/17/22 1055  BP: 126/73 (!) 167/97  [name])  144/91  Pulse: 66   75  Resp: 15 20  17   Temp: 98 F (36.7 C) 97.7 F (36.5 C)  97.8 F (36.6 C)  TempSrc: Oral Oral  Oral  SpO2: 99% 98% 98% 98%  Weight:      Height:        Intake/Output Summary (Last 24 hours) at 06/17/2022 1213 Last data filed at  06/17/2022 0900 Gross per 24 hour  Intake 488 ml  Output --  Net 488 ml    Filed Weights   06/14/22 0733 06/14/22 1752  Weight: 100.2 kg 96.6 kg    Scheduled Meds:  atorvastatin  10 mg Oral Daily   budesonide (PULMICORT) nebulizer solution  0.5 mg Nebulization BID   enoxaparin (LOVENOX) injection  40 mg Subcutaneous Q24H   feeding supplement (GLUCERNA SHAKE)  237 mL Oral BID BM   folic acid  1 mg Oral Daily   insulin aspart  0-15 Units Subcutaneous TID WC   insulin aspart  0-5 Units Subcutaneous QHS   insulin aspart  3 Units Subcutaneous TID WC   insulin glargine-yfgn  8 Units Subcutaneous Daily   ipratropium-albuterol  3 mL Nebulization TID   methylPREDNISolone (SOLU-MEDROL) injection  40 mg Intravenous Q12H   metoprolol succinate  50 mg Oral Daily   multivitamin with minerals  1 tablet Oral Daily   pantoprazole  40 mg Oral QAC breakfast   pregabalin  100 mg Oral BID   thiamine  100 mg Oral Daily   Or   thiamine  100 mg Intravenous Daily   Continuous Infusions:  azithromycin 250 mg (06/17/22 1022)    Nutritional status Signs/Symptoms: estimated needs Interventions: Glucerna shake, MVI Body mass index is 31.45 kg/m.  Data Reviewed:   CBC: Recent Labs  Lab 06/14/22 0118 06/14/22 0136 06/14/22 0348 06/15/22 0013 06/15/22 2352 06/16/22 2338  WBC 13.9*  --   --  16.2* 13.2* 13.2*  NEUTROABS 9.4*  --   --   --   --   --   HGB 12.8* 13.9 13.9 11.0* 11.3* 11.9*  HCT 40.9 41.0 41.0 35.3* 35.9* 38.3*  MCV 94.2  --   --  93.4 95.2 95.5  PLT 316  --   --  226 236 234    Basic Metabolic Panel: Recent Labs  Lab 06/14/22 0118 06/14/22 0136 06/14/22 0348 06/15/22 0013 06/15/22 2352 06/16/22 2338  NA 134* 134* 135 134* 136 137  K 4.6 4.3 4.3 5.1 4.9 4.8  CL 98  --   --  99 99 98  CO2 25  --   --  27 29 31   GLUCOSE 87  --   --  184* 174* 139*  BUN 22  --   --  31* 35* 34*  CREATININE 1.36*  --   --  1.13 1.23 1.02  CALCIUM 8.7*  --   --  9.1 8.9 9.8  MG  --    --   --  2.8* 2.5* 2.3    GFR: Estimated Creatinine Clearance: 87.2 mL/min (by C-G formula based on SCr of 1.02 mg/dL). Liver Function Tests: No results for input(s): "AST", "ALT", "ALKPHOS", "BILITOT", "PROT", "ALBUMIN" in the last 168 hours.  No results for input(s): "LIPASE", "AMYLASE" in the last 168 hours. No results for input(s): "AMMONIA" in the last 168 hours. Coagulation Profile: No results for input(s): "INR", "PROTIME" in the last 168 hours. Cardiac Enzymes: No results for input(s): "CKTOTAL", "CKMB", "CKMBINDEX", "TROPONINI" in the last 168 hours. BNP (last  3 results) No results for input(s): "PROBNP" in the last 8760 hours. HbA1C: Recent Labs    06/14/22 1853  HGBA1C 6.2*    CBG: Recent Labs  Lab 06/16/22 1115 06/16/22 1602 06/16/22 2155 06/17/22 0650 06/17/22 1054  GLUCAP 145* 193* 290* 166* 223*    Lipid Profile: No results for input(s): "CHOL", "HDL", "LDLCALC", "TRIG", "CHOLHDL", "LDLDIRECT" in the last 72 hours. Thyroid Function Tests: No results for input(s): "TSH", "T4TOTAL", "FREET4", "T3FREE", "THYROIDAB" in the last 72 hours. Anemia Panel: No results for input(s): "VITAMINB12", "FOLATE", "FERRITIN", "TIBC", "IRON", "RETICCTPCT" in the last 72 hours. Sepsis Labs: Recent Labs  Lab 06/15/22 0009  PROCALCITON <0.10     Recent Results (from the past 240 hour(s))  SARS Coronavirus 2 by RT PCR (hospital order, performed in [facility] hospital lab) *cepheid single result test* Anterior Nasal Swab     Status: None   Collection Time: 06/14/22  3:44 AM   Specimen: Anterior Nasal Swab  Result Value Ref Range Status   SARS Coronavirus 2 by RT PCR NEGATIVE NEGATIVE Final    Comment: Performed at [id number], [address], [place], [place] [address]         Radiology Studies: No results found.         LOS: 3 days   Time spent= 35 mins    Barnetta Chapel, MD [facility]  If 7PM-7AM, please contact  night-coverage  06/17/2022, 12:13 PM

## 2022-06-17 NOTE — Progress Notes (Signed)
Physical Therapy Treatment Patient Details Name: James [name] MRN: [id number] DOB: 14-Mar-1960 Today's Date: 06/17/2022   History of Present Illness 62 y.o. male presents to [facility] hospital on 06/14/2022 with respiratory distress. PMHx:  recurrent PNA, COPD, Covid, DM, HTN, ETOH abuse.    PT Comments    Patient eager to walk after awakened from deep sleep. Pt groggy/lethargic yet stated he was ready to stand and walk. Upon standing, he immediately lost his balance with quick sit back on EOB. Ambulated 250 ft on 3L with sats >92% throughout. Pt required minguard assist due to near fall at beginning of session and noted incr lateral excursion of torso bilaterally with imbalance noted.     Recommendations for follow up therapy are one component of a multi-disciplinary discharge planning process, led by the attending physician.  Recommendations may be updated based on patient status, additional functional criteria and insurance authorization.  Follow Up Recommendations       Assistance Recommended at Discharge PRN  Patient can return home with the following Assist for transportation   Equipment Recommendations  None recommended by PT    Recommendations for Other Services       Precautions / Restrictions Precautions Precautions: Other (comment) Precaution Comments: watch O2/HR Restrictions Weight Bearing Restrictions: No     Mobility  Bed Mobility Overal bed mobility: Modified Independent                  Transfers Overall transfer level: Needs assistance Equipment used: None Transfers: Sit to/from Stand Sit to Stand: Min guard           General transfer comment: pt stood, lost balance as he started to step and fell back into sitting at EOB    Ambulation/Gait Ambulation/Gait assistance: Min guard Gait Distance (Feet): 250 Feet Assistive device: None Gait Pattern/deviations: Step-through pattern, Wide base of support Gait velocity: functional     General Gait  Details: incr lateral excursion to each side; guarding due to initial LOB upon standing   Stairs             Wheelchair Mobility    Modified Rankin (Stroke Patients Only)       Balance Overall balance assessment: Needs assistance Sitting-balance support: No upper extremity supported, Feet unsupported Sitting balance-Leahy Scale: Normal     Standing balance support: No upper extremity supported, During functional activity Standing balance-Leahy Scale: Poor Standing balance comment: LOB as coming to stand                            Cognition Arousal/Alertness: Lethargic (asleep on arrival) Behavior During Therapy: WFL for tasks assessed/performed Overall Cognitive Status: Within Functional Limits for tasks assessed                                          Exercises      General Comments General comments (skin integrity, edema, etc.): Sats >92% on 3L; 3/4 dyspnea after ~200 ft. Pt required sitting rest break on return to room with audible wheezing initially. Sats >92% throughout.      Pertinent Vitals/Pain Pain Assessment Pain Assessment: No/denies pain    Home Living                          Prior Function  PT Goals (current goals can now be found in the care plan section) Acute Rehab PT Goals Patient Stated Goal: to improve activity tolerance and pulmonary function Time For Goal Achievement: 06/28/22 Potential to Achieve Goals: Good Progress towards PT goals: Not progressing toward goals - comment (lethargic, sleeping all day per RN; imbalance with quick return to sitting at EOB (near fall))    Frequency    Min 2X/week      PT Plan Current plan remains appropriate    Co-evaluation              AM-PAC PT "6 Clicks" Mobility   Outcome Measure  Help needed turning from your back to your side while in a flat bed without using bedrails?: None Help needed moving from lying on your back to  sitting on the side of a flat bed without using bedrails?: None Help needed moving to and from a bed to a chair (including a wheelchair)?: A Little Help needed standing up from a chair using your arms (e.g., wheelchair or bedside chair)?: A Little Help needed to walk in hospital room?: A Little Help needed climbing 3-5 steps with a railing? : A Little 6 Click Score: 20    End of Session Equipment Utilized During Treatment: Oxygen Activity Tolerance: Patient limited by lethargy (groggy after awakened) Patient left: with call bell/phone within reach;in chair Nurse Communication: Mobility status PT Visit Diagnosis: Other abnormalities of gait and mobility (R26.89)     Time: 1510-1531 PT Time Calculation (min) (ACUTE ONLY): 21 min  Charges:  $Gait Training: 8-22 mins                      Jerolyn Center, PT Acute Rehabilitation Services  Office [phone]    Zena Amos 06/17/2022, 3:41 PM

## 2022-06-18 DIAGNOSIS — J9621 Acute and chronic respiratory failure with hypoxia: Secondary | ICD-10-CM | POA: Diagnosis not present

## 2022-06-18 DIAGNOSIS — J9622 Acute and chronic respiratory failure with hypercapnia: Secondary | ICD-10-CM | POA: Diagnosis not present

## 2022-06-18 LAB — GLUCOSE, CAPILLARY
Glucose-Capillary: 182 mg/dL — ABNORMAL HIGH (ref 70–99)
Glucose-Capillary: 163 mg/dL — ABNORMAL HIGH (ref 70–99)
Glucose-Capillary: 194 mg/dL — ABNORMAL HIGH (ref 70–99)
Glucose-Capillary: 158 mg/dL — ABNORMAL HIGH (ref 70–99)

## 2022-06-18 LAB — CBC
HCT: 39 % (ref 39.0–52.0)
Hemoglobin: 12.2 g/dL — ABNORMAL LOW (ref 13.0–17.0)
MCH: 29.8 pg (ref 26.0–34.0)
MCHC: 31.3 g/dL (ref 30.0–36.0)
MCV: 95.1 fL (ref 80.0–100.0)
Platelets: 234 10*3/uL (ref 150–400)
RBC: 4.1 MIL/uL — ABNORMAL LOW (ref 4.22–5.81)
RDW: 14.5 % (ref 11.5–15.5)
WBC: 13.7 10*3/uL — ABNORMAL HIGH (ref 4.0–10.5)
nRBC: 0.1 % (ref 0.0–0.2)

## 2022-06-18 LAB — BASIC METABOLIC PANEL
Anion gap: 9 (ref 5–15)
BUN: 33 mg/dL — ABNORMAL HIGH (ref 8–23)
CO2: 32 mmol/L (ref 22–32)
Calcium: 9.4 mg/dL (ref 8.9–10.3)
Chloride: 96 mmol/L — ABNORMAL LOW (ref 98–111)
Creatinine, Ser: 1.04 mg/dL (ref 0.61–1.24)
GFR, Estimated: >60 mL/min (ref >60)
Glucose, Bld: 143 mg/dL — ABNORMAL HIGH (ref 70–99)
Potassium: 4.8 mmol/L (ref 3.5–5.1)
Sodium: 137 mmol/L (ref 135–145)

## 2022-06-18 LAB — MAGNESIUM: Magnesium: 2.3 mg/dL (ref 1.7–2.4)

## 2022-06-18 MED ORDER — IPRATROPIUM-ALBUTEROL 0.5-2.5 (3) MG/3ML IN SOLN
3.0000 mL | Freq: Two times a day (BID) | RESPIRATORY_TRACT | Status: DC
  Administered 2022-06-18 – 2022-06-21 (×6): 3 mL via RESPIRATORY_TRACT
  Filled 2022-06-18 (×6): qty 3

## 2022-06-18 MED ORDER — METHYLPREDNISOLONE SODIUM SUCC 40 MG IJ SOLR
40.0000 mg | Freq: Three times a day (TID) | INTRAMUSCULAR | Status: DC
  Administered 2022-06-18 – 2022-06-21 (×8): 40 mg via INTRAVENOUS
  Filled 2022-06-18 (×8): qty 1

## 2022-06-18 NOTE — Progress Notes (Signed)
Mobility Specialist Progress Note:   06/18/22 1500  Mobility  Activity Ambulated with assistance in hallway  Level of Assistance Minimal assist, patient does 75% or more  Assistive Device None  Distance Ambulated (ft) 510 ft  Activity Response Tolerated well  Mobility Referral Yes  $Mobility charge 1 Mobility  Mobility Specialist Start Time (ACUTE ONLY) 1516  Mobility Specialist Stop Time (ACUTE ONLY) 1543  Mobility Specialist Time Calculation (min) (ACUTE ONLY) 27 min    Pre Mobility: 84 HR,  138/87 BP,  94% SpO2 During Mobility: 100 HR,  92% SpO2 Post Mobility:  84 HR,  165/94 BP,  95% SpO2  Pt received in bed, agreeable to mobility. Ambulated in hallway w/o AD at [facility]. Pt ambulated on 3 L/min. Pt experienced LOB x1 requiring MinA to correct. Pt left on EOB with call bell.  D'Vante Earlene Plater Mobility Specialist Please contact via Special educational needs teacher or Rehab office at [phone]

## 2022-06-18 NOTE — Progress Notes (Signed)
   06/18/22 2002  BiPAP/CPAP/SIPAP  BiPAP/CPAP/SIPAP Pt Type Adult  Reason BIPAP/CPAP not in use Non-compliant (Pt refused, no bipap in room.No distress noted. Pt on 3L.)  BiPAP/CPAP /SiPAP Vitals  SpO2 96 %  Bilateral Breath Sounds Diminished

## 2022-06-18 NOTE — Progress Notes (Signed)
PROGRESS NOTE    James [name]  ZOX:[id number] DOB: [birth year] DOA: 06/14/2022 PCP: [name], MD   Brief Narrative:  62 year old with history of COPD, chronic hypoxia/hypercapnic respiratory failure, chronically on 5 L nasal cannula, ETOh Use, recurrent pneumonia, alcohol use in remission, DM2, HTN presented to the ED with respiratory distress.  Upon admission noted to be in hypoxic and hypercapnic respiratory failure requiring BiPAP.  Chest x-ray was negative besides multiple remote bilateral rib fractures.  Slowly being weaned off BiPAP getting aggressive bronchodilators, steroids.  Also showing some signs of alcohol withdrawal on CIWA protocol and Librium taper.  Received IV fluids for AKI.  AKI has resolved.  06/17/2022: Slight improvement in respiratory symptoms reported.  Acute kidney injury has resolved. 06/18/2022: Patient continues to improve slowly.  No new complaints today.  Will increase IV Solu-Medrol from 40 Mg twice daily to every 8 only.  No significant withdrawal symptoms noted.   Assessment & Plan:  Principal Problem:   Acute on chronic respiratory failure with hypoxia and hypercapnia (HCC) Active Problems:   COPD mixed type (HCC)   Hypertension   Type 2 diabetes mellitus without complication, without long-term current use of insulin (HCC)   COPD with acute exacerbation (HCC)   Multiple lung nodules on CT     Assessment and Plan: * Acute on chronic respiratory failure with hypoxia and hypercapnia (HCC) Acute COPD exacerbation -Unfortunately patient continues to use tobacco. Cont Steroids, Nebs sch & prn, IS/Flutter. -BNP negative.  Procalcitonin negative, discontinue Rocephin.  Complete total 5-day course of azithromycin - COVID-negative 06/17/2022: Mild improvement reported.  Continue current management. 06/18/2022: Continues to improve slowly.  Increase dose of IV Solu-Medrol to 40 Mg every 8 hourly.  Acute kidney injury, improving - Baseline creatinine  1.0, admission creatinine 1.36 now improving slowly 06/18/2022: AKI has resolved.  Serum creatinine is 1.04 today.  ETOH use -Librium taper.  -Continue CIWA protocol.     Multiple lung nodules on CT Multiple pulm nodules seen June 2023 had completely resolved on repeat CT in Feb 2024.  Suspicion now is that they were due to an infectious cause that resolved and not neoplastic.  No mention of pulm nodules on the Feb 2024 scan.  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  Sliding scale and Accu-Cheks. Holding home PO meds.  Increase Semglee 8U and Novolog 3U premeal added. Adjust prn.  Continue Lyrica 06/17/2022: Monitor blood sugar closely.  GERD - PPI  Hypertension Hold Losartan and Lasix until renal Fx better.  Cont BB. IV prn meds.  06/17/2022: Continue to optimize.    DVT prophylaxis: Lovenox Code Status: Full code Family Communication:   Status is: Inpatient Continue hospital stay.  Significant abnormal breath sounds       Diet Orders (From admission, onward)     Start     Ordered   06/14/22 1103  Diet Carb Modified Fluid consistency: Thin; Room service appropriate? Yes  Diet effective now       Question Answer Comment  Diet-HS Snack? Nothing   Calorie Level Medium 1600-2000   Fluid consistency: Thin   Room service appropriate? Yes      06/14/22 1102            Subjective: No new complaints. No wheezing.   Examination: Constitutional: Patient is awake and alert.  Not in any distress.  No withdrawal symptoms noted.  Patient is obese. Respiratory:  Mildly improved air entry.     Cardiovascular: S1-S2. Abdomen: Obese, soft and nontender. Musculoskeletal: No edema noted Neurologic: Awake and alert.  Patient moves all extremities.  Objective: Vitals:   06/18/22 0744 06/18/22 0934 06/18/22 1120 06/18/22 1535  BP:  (!) 117/57 (!) 146/85 (!) 165/94   Pulse:  74 78 75  Resp:   19 16  Temp:   98.4 F (36.9 C) 97.6 F (36.4 C)  TempSrc:   Oral Oral  SpO2: 94%  98% 96%  Weight:      Height:        Intake/Output Summary (Last 24 hours) at 06/18/2022 1751 Last data filed at 06/18/2022 0505 Gross per 24 hour  Intake 376.34 ml  Output 551 ml  Net -174.66 ml    Filed Weights   06/14/22 0733 06/14/22 1752  Weight: 100.2 kg 96.6 kg    Scheduled Meds:  atorvastatin  10 mg Oral Daily   budesonide (PULMICORT) nebulizer solution  0.5 mg Nebulization BID   enoxaparin (LOVENOX) injection  40 mg Subcutaneous Q24H   feeding supplement (GLUCERNA SHAKE)  237 mL Oral BID BM   folic acid  1 mg Oral Daily   insulin aspart  0-15 Units Subcutaneous TID WC   insulin aspart  0-5 Units Subcutaneous QHS   insulin aspart  3 Units Subcutaneous TID WC   insulin glargine-yfgn  8 Units Subcutaneous Daily   ipratropium-albuterol  3 mL Nebulization BID   methylPREDNISolone (SOLU-MEDROL) injection  40 mg Intravenous Q12H   metoprolol succinate  50 mg Oral Daily   multivitamin with minerals  1 tablet Oral Daily   pantoprazole  40 mg Oral QAC breakfast   pregabalin  100 mg Oral BID   thiamine  100 mg Oral Daily   Or   thiamine  100 mg Intravenous Daily   Continuous Infusions:    Nutritional status Signs/Symptoms: estimated needs Interventions: Glucerna shake, MVI Body mass index is 31.45 kg/m.  Data Reviewed:   CBC: Recent Labs  Lab 06/14/22 0118 06/14/22 0136 06/14/22 0348 06/15/22 0013 06/15/22 2352 06/16/22 2338 06/18/22 0001  WBC 13.9*  --   --  16.2* 13.2* 13.2* 13.7*  NEUTROABS 9.4*  --   --   --   --   --   --   HGB 12.8*   < > 13.9 11.0* 11.3* 11.9* 12.2*  HCT 40.9   < > 41.0 35.3* 35.9* 38.3* 39.0  MCV 94.2  --   --  93.4 95.2 95.5 95.1  PLT 316  --   --  226 236 234 234   < > = values in this interval not displayed.    Basic Metabolic Panel: Recent Labs  Lab 06/14/22 0118 06/14/22 0136 06/14/22 0348  06/15/22 0013 06/15/22 2352 06/16/22 2338 06/18/22 0001  NA 134*   < > 135 134* 136 137 137  K 4.6   < > 4.3 5.1 4.9 4.8 4.8  CL 98  --   --  99 99 98 96*  CO2 25  --   --  27 29 31  32  GLUCOSE 87  --   --  184* 174* 139* 143*  BUN 22  --   --  31* 35* 34* 33*  CREATININE 1.36*  --   --  1.13 1.23 1.02 1.04  CALCIUM 8.7*  --   --  9.1 8.9 9.8 9.4  MG  --   --   --  2.8* 2.5* 2.3 2.3   < > =  values in this interval not displayed.    GFR: Estimated Creatinine Clearance: 85.6 mL/min (by C-G formula based on SCr of 1.04 mg/dL). Liver Function Tests: No results for input(s): "AST", "ALT", "ALKPHOS", "BILITOT", "PROT", "ALBUMIN" in the last 168 hours.  No results for input(s): "LIPASE", "AMYLASE" in the last 168 hours. No results for input(s): "AMMONIA" in the last 168 hours. Coagulation Profile: No results for input(s): "INR", "PROTIME" in the last 168 hours. Cardiac Enzymes: No results for input(s): "CKTOTAL", "CKMB", "CKMBINDEX", "TROPONINI" in the last 168 hours. BNP (last 3 results) No results for input(s): "PROBNP" in the last 8760 hours. HbA1C: No results for input(s): "HGBA1C" in the last 72 hours.  CBG: Recent Labs  Lab 06/17/22 1548 06/17/22 2111 06/18/22 0613 06/18/22 1119 06/18/22 1548  GLUCAP 177* 147* 182* 163* 194*    Lipid Profile: No results for input(s): "CHOL", "HDL", "LDLCALC", "TRIG", "CHOLHDL", "LDLDIRECT" in the last 72 hours. Thyroid Function Tests: No results for input(s): "TSH", "T4TOTAL", "FREET4", "T3FREE", "THYROIDAB" in the last 72 hours. Anemia Panel: No results for input(s): "VITAMINB12", "FOLATE", "FERRITIN", "TIBC", "IRON", "RETICCTPCT" in the last 72 hours. Sepsis Labs: Recent Labs  Lab 06/15/22 0009  PROCALCITON <0.10     Recent Results (from the past 240 hour(s))  SARS Coronavirus 2 by RT PCR (hospital order, performed in [facility] hospital lab) *cepheid single result test* Anterior Nasal Swab     Status: None   Collection  Time: 06/14/22  3:44 AM   Specimen: Anterior Nasal Swab  Result Value Ref Range Status   SARS Coronavirus 2 by RT PCR NEGATIVE NEGATIVE Final    Comment: Performed at [facility], [address] [place], [place] [address]         Radiology Studies: No results found.         LOS: 4 days   Time spent= 35 mins    Barnetta Chapel, MD [facility]  If 7PM-7AM, please contact night-coverage  06/18/2022, 5:51 PM

## 2022-06-18 NOTE — Progress Notes (Signed)
Occupational Therapy Treatment Patient Details Name: James [name] MRN: [id number] DOB: 16-May-1960 Today's Date: 06/18/2022   History of present illness 62 y.o. male presents to [facility] hospital on 06/14/2022 with respiratory distress. PMHx:  recurrent PNA, COPD, Covid, DM, HTN, ETOH abuse.   OT comments  Pt. Seen for skilled OT treatment session.  Able to complete bed mobility, lb dressing task, and short distance ambulation to b.room for toileiting.  S mostly but min guard a for sit/stand. Heavy reliance on heels and surface of furniture on back of LEs for support when coming into standing.  Reviewed other tech. For a safer approach in sit/stand.  Also reviewed energy conservation and PLB strategies.  Pt. Receptive to education but unable to return consistent demo of safer sit/stands. Likely will cont. Same approach for sit/stands (heels) with potential for abrupt sit and require 2nd attempt for standing as demonstrated in previous PT and today's session.  Agree with current d/c recommendations.     Recommendations for follow up therapy are one component of a multi-disciplinary discharge planning process, led by the attending physician.  Recommendations may be updated based on patient status, additional functional criteria and insurance authorization.    Assistance Recommended at Discharge Set up Supervision/Assistance  Patient can return home with the following  A little help with bathing/dressing/bathroom;Assistance with cooking/housework;Assist for transportation;Help with stairs or ramp for entrance   Equipment Recommendations  Tub/shower seat    Recommendations for Other Services      Precautions / Restrictions Precautions Precautions: Other (comment) Precaution Comments: watch O2/HR       Mobility Bed Mobility Overal bed mobility: Modified Independent                  Transfers Overall transfer level: Needs assistance Equipment used: None Transfers: Sit to/from  Stand Sit to Stand: Min guard           General transfer comment: had pt. perform multiple sit/stands with emphasis on pushing through fronts of feet/toes vs. heels and heavy reliance on surface of furniture to support him when coming into standing.  pt. able to partially push through fronts of feet but noted toes still up in air and tendancy to push through heels.  reviewed safety risks. pt. verbalized understanding but unable to consistently demo     Balance                                           ADL either performed or assessed with clinical judgement   ADL Overall ADL's : Needs assistance/impaired                     Lower Body Dressing: Set up;Sitting/lateral leans;Cueing for safety;Cueing for compensatory techniques Lower Body Dressing Details (indicate cue type and reason): reviewed, demonstrated and had pt. return demo of bringing bles up towards be and/or pt. vs. bending forward to prevent sob and falls Toilet Transfer: Ambulation;Regular Toilet;Supervision/safety           Functional mobility during ADLs: Supervision/safety;Min guard General ADL Comments: reviewed energy conservation strategies along with PLB. pt. able to return demo and verbalize understanding    Extremity/Trunk Assessment              Vision       Perception     Praxis      Cognition Arousal/Alertness: Awake/alert Behavior During Therapy:  WFL for tasks assessed/performed Overall Cognitive Status: Within Functional Limits for tasks assessed                                          Exercises      Shoulder Instructions       General Comments      Pertinent Vitals/ Pain       Pain Assessment Pain Assessment: No/denies pain  Home Living                                          Prior Functioning/Environment              Frequency  Min 1X/week        Progress Toward Goals  OT Goals(current goals can  now be found in the care plan section)  Progress towards OT goals: Progressing toward goals     Plan Discharge plan remains appropriate    Co-evaluation                 AM-PAC OT "6 Clicks" Daily Activity     Outcome Measure   Help from another person eating meals?: None Help from another person taking care of personal grooming?: A Little Help from another person toileting, which includes using toliet, bedpan, or urinal?: A Little Help from another person bathing (including washing, rinsing, drying)?: A Little Help from another person to put on and taking off regular upper body clothing?: A Little Help from another person to put on and taking off regular lower body clothing?: A Little 6 Click Score: 19    End of Session Equipment Utilized During Treatment: Gait belt;Oxygen  OT Visit Diagnosis: Unsteadiness on feet (R26.81);Other abnormalities of gait and mobility (R26.89);Muscle weakness (generalized) (M62.81)   Activity Tolerance Patient tolerated treatment well   Patient Left with call bell/phone within reach;in bed   Nurse Communication Other (comment) (updated rn pt. in bed having his coffee at end of session and that he had urinated in b.room during toileting task)        Time: 1610-9604 OT Time Calculation (min): 17 min  Charges: OT General Charges $OT Visit: 1 Visit OT Treatments $Self Care/Home Management : 8-22 mins  Boneta Lucks, COTA/L Acute Rehabilitation [phone]   Alessandra Bevels Lorraine-COTA/L 06/18/2022, 12:06 PM

## 2022-06-19 ENCOUNTER — Inpatient Hospital Stay (HOSPITAL_COMMUNITY): Payer: Medicaid Other

## 2022-06-19 DIAGNOSIS — J9622 Acute and chronic respiratory failure with hypercapnia: Secondary | ICD-10-CM | POA: Diagnosis not present

## 2022-06-19 DIAGNOSIS — J9621 Acute and chronic respiratory failure with hypoxia: Secondary | ICD-10-CM | POA: Diagnosis not present

## 2022-06-19 DIAGNOSIS — R109 Unspecified abdominal pain: Secondary | ICD-10-CM | POA: Diagnosis not present

## 2022-06-19 DIAGNOSIS — K5900 Constipation, unspecified: Secondary | ICD-10-CM | POA: Diagnosis not present

## 2022-06-19 LAB — BASIC METABOLIC PANEL
Anion gap: 8 (ref 5–15)
BUN: 30 mg/dL — ABNORMAL HIGH (ref 8–23)
CO2: 33 mmol/L — ABNORMAL HIGH (ref 22–32)
Calcium: 9 mg/dL (ref 8.9–10.3)
Chloride: 96 mmol/L — ABNORMAL LOW (ref 98–111)
Creatinine, Ser: 1 mg/dL (ref 0.61–1.24)
GFR, Estimated: >60 mL/min (ref >60)
Glucose, Bld: 188 mg/dL — ABNORMAL HIGH (ref 70–99)
Potassium: 4.7 mmol/L (ref 3.5–5.1)
Sodium: 137 mmol/L (ref 135–145)

## 2022-06-19 LAB — CBC
HCT: 37 % — ABNORMAL LOW (ref 39.0–52.0)
Hemoglobin: 11.4 g/dL — ABNORMAL LOW (ref 13.0–17.0)
MCH: 28.7 pg (ref 26.0–34.0)
MCHC: 30.8 g/dL (ref 30.0–36.0)
MCV: 93.2 fL (ref 80.0–100.0)
Platelets: 226 10*3/uL (ref 150–400)
RBC: 3.97 MIL/uL — ABNORMAL LOW (ref 4.22–5.81)
RDW: 14.3 % (ref 11.5–15.5)
WBC: 13.1 10*3/uL — ABNORMAL HIGH (ref 4.0–10.5)
nRBC: 0 % (ref 0.0–0.2)

## 2022-06-19 LAB — GLUCOSE, CAPILLARY
Glucose-Capillary: 151 mg/dL — ABNORMAL HIGH (ref 70–99)
Glucose-Capillary: 287 mg/dL — ABNORMAL HIGH (ref 70–99)
Glucose-Capillary: 150 mg/dL — ABNORMAL HIGH (ref 70–99)
Glucose-Capillary: 208 mg/dL — ABNORMAL HIGH (ref 70–99)

## 2022-06-19 LAB — MAGNESIUM: Magnesium: 2.3 mg/dL (ref 1.7–2.4)

## 2022-06-19 MED ORDER — NICOTINE 21 MG/24HR TD PT24
21.0000 mg | MEDICATED_PATCH | Freq: Every day | TRANSDERMAL | Status: DC
  Administered 2022-06-19 – 2022-06-21 (×3): 21 mg via TRANSDERMAL
  Filled 2022-06-19 (×3): qty 1

## 2022-06-19 MED ORDER — LOSARTAN POTASSIUM 50 MG PO TABS
100.0000 mg | ORAL_TABLET | Freq: Every day | ORAL | Status: DC
  Administered 2022-06-19 – 2022-06-21 (×3): 100 mg via ORAL
  Filled 2022-06-19 (×3): qty 2

## 2022-06-19 MED ORDER — FUROSEMIDE 40 MG PO TABS
40.0000 mg | ORAL_TABLET | Freq: Every day | ORAL | Status: DC
  Administered 2022-06-19 – 2022-06-21 (×3): 40 mg via ORAL
  Filled 2022-06-19 (×3): qty 1

## 2022-06-19 NOTE — Plan of Care (Signed)

## 2022-06-19 NOTE — Progress Notes (Deleted)
RN notified both cards and TRH patient arrived to unit.

## 2022-06-19 NOTE — Progress Notes (Signed)
PROGRESS NOTE    James [name]  ZOX:[id number] DOB: Aug 16, 1960 DOA: 06/14/2022 PCP: [name], MD   Brief Narrative:  62 year old with history of COPD, chronic hypoxia/hypercapnic respiratory failure, chronically on 5 L nasal cannula, ETOh Use, recurrent pneumonia, alcohol use in remission, DM2, HTN presented to the ED with respiratory distress.  Upon admission noted to be in hypoxic and hypercapnic respiratory failure requiring BiPAP.  Chest x-ray was negative besides multiple remote bilateral rib fractures.  Slowly being weaned off BiPAP getting aggressive bronchodilators, steroids.  Also showing some signs of alcohol withdrawal on CIWA protocol and Librium taper.  Received IV fluids for AKI.  AKI has resolved.  06/17/2022: Slight improvement in respiratory symptoms reported.  Acute kidney injury has resolved. 06/18/2022: Patient continues to improve slowly.  No new complaints today.  Will increase IV Solu-Medrol from 40 Mg twice daily to every 8 only.  No significant withdrawal symptoms noted. 06/19/2022: Patient continues to improve.  Air entry remains initiated.  Will continue current management.   Assessment & Plan:  Principal Problem:   Acute on chronic respiratory failure with hypoxia and hypercapnia (HCC) Active Problems:   COPD mixed type (HCC)   Hypertension   Type 2 diabetes mellitus without complication, without long-term current use of insulin (HCC)   COPD with acute exacerbation (HCC)   Multiple lung nodules on CT     Assessment and Plan: * Acute on chronic respiratory failure with hypoxia and hypercapnia (HCC) Acute COPD exacerbation -Unfortunately patient continues to use tobacco. Cont Steroids, Nebs sch & prn, IS/Flutter. -BNP negative.  Procalcitonin negative, discontinue Rocephin.  Complete total 5-day course of azithromycin - COVID-negative 06/17/2022: Mild improvement reported.  Continue current management. 06/18/2022: Continues to improve slowly.  Increase  dose of IV Solu-Medrol to 40 Mg every 8 hourly. 06/19/2022: Continue current management.  Continue IV Solu-Medrol 40 Mg every 8 hourly.  Acute kidney injury, improving - Baseline creatinine 1.0, admission creatinine 1.36 now improving slowly 06/18/2022: AKI has resolved.  Serum creatinine is 1.04 today.  ETOH use -Librium taper.  -Continue CIWA protocol.     Multiple lung nodules on CT Multiple pulm nodules seen June 2023 had completely resolved on repeat CT in Feb 2024.  Suspicion now is that they were due to an infectious cause that resolved and not neoplastic.  No mention of pulm nodules on the Feb 2024 scan.  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  Sliding scale and Accu-Cheks. Holding home PO meds.  Increase Semglee 8U and Novolog 3U premeal added. Adjust prn.  Continue Lyrica 06/19/2022: Monitor blood sugar closely.  GERD - PPI  Hypertension Restart Cozaar 100 mg p.o. once daily.    Cont BB. IV prn meds.  06/17/2022: Continue to optimize. 06/19/2022: Blood pressure is uncontrolled.  Restart Cozaar 100 Mg p.o. once daily.  Continue beta-blockers.  Continue to optimize.   DVT prophylaxis: Lovenox Code Status: Full code Family Communication:   Status is: Inpatient Continue hospital stay.  Significant abnormal breath sounds       Diet Orders (From admission, onward)     Start     Ordered   06/14/22 1103  Diet Carb Modified Fluid consistency: Thin; Room service appropriate? Yes  Diet effective now       Question Answer Comment  Diet-HS Snack? Nothing   Calorie Level Medium 1600-2000   Fluid consistency: Thin   Room service appropriate? Yes  06/14/22 1102            Subjective: No new complaints. No wheezing.   Examination: Constitutional: Patient is awake and alert.  Not in any distress.  No withdrawal symptoms noted.  Patient is  obese. Respiratory: Improved air entry.     Cardiovascular: S1-S2. Abdomen: Obese, soft and nontender. Musculoskeletal: No edema noted Neurologic: Awake and alert.  Patient moves all extremities.  Objective: Vitals:   06/18/22 2329 06/19/22 0334 06/19/22 0747 06/19/22 0814  BP: (!) 154/88 (!) 148/109  137/78  Pulse: 75 71  65  Resp: 18 18  15   Temp: 98 F (36.7 C) 98.3 F (36.8 C)  97.9 F (36.6 C)  TempSrc: Oral Oral  Oral  SpO2: 96% 94% 95%   Weight:      Height:        Intake/Output Summary (Last 24 hours) at 06/19/2022 0941 Last data filed at 06/18/2022 1829 Gross per 24 hour  Intake 86.37 ml  Output --  Net 86.37 ml    Filed Weights   06/14/22 0733 06/14/22 1752  Weight: 100.2 kg 96.6 kg    Scheduled Meds:  atorvastatin  10 mg Oral Daily   budesonide (PULMICORT) nebulizer solution  0.5 mg Nebulization BID   enoxaparin (LOVENOX) injection  40 mg Subcutaneous Q24H   feeding supplement (GLUCERNA SHAKE)  237 mL Oral BID BM   folic acid  1 mg Oral Daily   insulin aspart  0-15 Units Subcutaneous TID WC   insulin aspart  0-5 Units Subcutaneous QHS   insulin aspart  3 Units Subcutaneous TID WC   insulin glargine-yfgn  8 Units Subcutaneous Daily   ipratropium-albuterol  3 mL Nebulization BID   methylPREDNISolone (SOLU-MEDROL) injection  40 mg Intravenous Q8H   metoprolol succinate  50 mg Oral Daily   multivitamin with minerals  1 tablet Oral Daily   pantoprazole  40 mg Oral QAC breakfast   pregabalin  100 mg Oral BID   thiamine  100 mg Oral Daily   Or   thiamine  100 mg Intravenous Daily   Continuous Infusions:    Nutritional status Signs/Symptoms: estimated needs Interventions: Glucerna shake, MVI Body mass index is 31.45 kg/m.  Data Reviewed:   CBC: Recent Labs  Lab 06/14/22 0118 06/14/22 0136 06/15/22 0013 06/15/22 2352 06/16/22 2338 06/18/22 0001 06/18/22 2356  WBC 13.9*  --  16.2* 13.2* 13.2* 13.7* 13.1*  NEUTROABS 9.4*  --   --   --   --    --   --   HGB 12.8*   < > 11.0* 11.3* 11.9* 12.2* 11.4*  HCT 40.9   < > 35.3* 35.9* 38.3* 39.0 37.0*  MCV 94.2  --  93.4 95.2 95.5 95.1 93.2  PLT 316  --  226 236 234 234 226   < > = values in this interval not displayed.    Basic Metabolic Panel: Recent Labs  Lab 06/15/22 0013 06/15/22 2352 06/16/22 2338 06/18/22 0001 06/18/22 2356  NA 134* 136 137 137 137  K 5.1 4.9 4.8 4.8 4.7  CL 99 99 98 96* 96*  CO2 27 29 31  32 33*  GLUCOSE 184* 174* 139* 143* 188*  BUN 31* 35* 34* 33* 30*  CREATININE 1.13 1.23 1.02 1.04 1.00  CALCIUM 9.1 8.9 9.8 9.4 9.0  MG 2.8* 2.5* 2.3 2.3 2.3    GFR: Estimated Creatinine Clearance: 89 mL/min (by C-G formula based on SCr of 1 mg/dL). Liver Function Tests: No results for  input(s): "AST", "ALT", "ALKPHOS", "BILITOT", "PROT", "ALBUMIN" in the last 168 hours.  No results for input(s): "LIPASE", "AMYLASE" in the last 168 hours. No results for input(s): "AMMONIA" in the last 168 hours. Coagulation Profile: No results for input(s): "INR", "PROTIME" in the last 168 hours. Cardiac Enzymes: No results for input(s): "CKTOTAL", "CKMB", "CKMBINDEX", "TROPONINI" in the last 168 hours. BNP (last 3 results) No results for input(s): "PROBNP" in the last 8760 hours. HbA1C: No results for input(s): "HGBA1C" in the last 72 hours.  CBG: Recent Labs  Lab 06/18/22 0613 06/18/22 1119 06/18/22 1548 06/18/22 2102 06/19/22 0619  GLUCAP 182* 163* 194* 158* 151*    Lipid Profile: No results for input(s): "CHOL", "HDL", "LDLCALC", "TRIG", "CHOLHDL", "LDLDIRECT" in the last 72 hours. Thyroid Function Tests: No results for input(s): "TSH", "T4TOTAL", "FREET4", "T3FREE", "THYROIDAB" in the last 72 hours. Anemia Panel: No results for input(s): "VITAMINB12", "FOLATE", "FERRITIN", "TIBC", "IRON", "RETICCTPCT" in the last 72 hours. Sepsis Labs: Recent Labs  Lab 06/15/22 0009  PROCALCITON <0.10     Recent Results (from the past 240 hour(s))  SARS Coronavirus  2 by RT PCR (hospital order, performed in [facility] hospital lab) *cepheid single result test* Anterior Nasal Swab     Status: None   Collection Time: 06/14/22  3:44 AM   Specimen: Anterior Nasal Swab  Result Value Ref Range Status   SARS Coronavirus 2 by RT PCR NEGATIVE NEGATIVE Final    Comment: Performed at [facility] Lab, [address] [place], [place] [address]         Radiology Studies: No results found.         LOS: 5 days   Time spent= 35 mins    Barnetta Chapel, MD [facility]  If 7PM-7AM, please contact night-coverage  06/19/2022, 9:41 AM

## 2022-06-19 NOTE — Hospital Course (Signed)
62yo male with h/o COPD on 5L  O2, ETOH dependence in remission, DM, and HTN who presented on 6/10 with SOB.  He was started on BIPAP.  CXR was unremarkable other than multiple remote rib fractures.  AKI has resolved.  Remains on CIWA.  COPD exacerbation is being treated with Azithromycin x 5 days and steroid taper.

## 2022-06-19 NOTE — Progress Notes (Signed)
Mobility Specialist: Progress Note   06/19/22 1627  Mobility  Activity Ambulated with assistance in hallway  Level of Assistance Contact guard assist, steadying assist  Assistive Device None  Distance Ambulated (ft) 340 ft  Activity Response Tolerated well  Mobility Referral Yes  $Mobility charge 1 Mobility  Mobility Specialist Start Time (ACUTE ONLY) 1613  Mobility Specialist Stop Time (ACUTE ONLY) 1625  Mobility Specialist Time Calculation (min) (ACUTE ONLY) 12 min   Pre-Mobility on 5 L/min [name]: 72 HR, 98% SpO2 During Mobility on 3 L/min [name]: 96% SpO2 Post-Mobility on 3 L/min [name]: 83 HR, 96% SpO2  Pt received in the bed and agreeable to mobility. Ambulated on 3 L/min . Mod I with bed mobility and contact guard for balance as pt has tendency to drift Lt/Rt. No c/o throughout. Pt to the chair after session per request with call bell and phone in reach. Chair alarm is on.   James [name] Mobility Specialist Please contact via SecureChat or Rehab office at [phone]

## 2022-06-20 DIAGNOSIS — J9622 Acute and chronic respiratory failure with hypercapnia: Secondary | ICD-10-CM | POA: Diagnosis not present

## 2022-06-20 DIAGNOSIS — J9621 Acute and chronic respiratory failure with hypoxia: Secondary | ICD-10-CM | POA: Diagnosis not present

## 2022-06-20 LAB — GLUCOSE, CAPILLARY
Glucose-Capillary: 144 mg/dL — ABNORMAL HIGH (ref 70–99)
Glucose-Capillary: 226 mg/dL — ABNORMAL HIGH (ref 70–99)
Glucose-Capillary: 223 mg/dL — ABNORMAL HIGH (ref 70–99)
Glucose-Capillary: 220 mg/dL — ABNORMAL HIGH (ref 70–99)

## 2022-06-20 LAB — CBC
HCT: 38 % — ABNORMAL LOW (ref 39.0–52.0)
Hemoglobin: 12 g/dL — ABNORMAL LOW (ref 13.0–17.0)
MCH: 29.3 pg (ref 26.0–34.0)
MCHC: 31.6 g/dL (ref 30.0–36.0)
MCV: 92.7 fL (ref 80.0–100.0)
Platelets: 244 10*3/uL (ref 150–400)
RBC: 4.1 MIL/uL — ABNORMAL LOW (ref 4.22–5.81)
RDW: 14.2 % (ref 11.5–15.5)
WBC: 13.5 10*3/uL — ABNORMAL HIGH (ref 4.0–10.5)
nRBC: 0.1 % (ref 0.0–0.2)

## 2022-06-20 LAB — BASIC METABOLIC PANEL
Anion gap: 8 (ref 5–15)
BUN: 29 mg/dL — ABNORMAL HIGH (ref 8–23)
CO2: 35 mmol/L — ABNORMAL HIGH (ref 22–32)
Calcium: 9.2 mg/dL (ref 8.9–10.3)
Chloride: 90 mmol/L — ABNORMAL LOW (ref 98–111)
Creatinine, Ser: 1 mg/dL (ref 0.61–1.24)
GFR, Estimated: >60 mL/min (ref >60)
Glucose, Bld: 208 mg/dL — ABNORMAL HIGH (ref 70–99)
Potassium: 4.4 mmol/L (ref 3.5–5.1)
Sodium: 133 mmol/L — ABNORMAL LOW (ref 135–145)

## 2022-06-20 LAB — MAGNESIUM: Magnesium: 2.2 mg/dL (ref 1.7–2.4)

## 2022-06-20 NOTE — Progress Notes (Signed)
PROGRESS NOTE    CHRISTEPHER FRONHEISER  WUJ:[id number] DOB: 1960/02/03 DOA: 06/14/2022 PCP: [name], MD   Brief Narrative:  62 year old with history of COPD, chronic hypoxia/hypercapnic respiratory failure, chronically on 5 L nasal cannula, ETOh Use, recurrent pneumonia, alcohol use in remission, DM2, HTN presented to the ED with respiratory distress.  Upon admission noted to be in hypoxic and hypercapnic respiratory failure requiring BiPAP.  Chest x-ray was negative besides multiple remote bilateral rib fractures.  Slowly being weaned off BiPAP getting aggressive bronchodilators, steroids.  Also showing some signs of alcohol withdrawal on CIWA protocol and Librium taper.  Received IV fluids for AKI.  AKI has resolved.  06/20/2022: Patient continues to improve slowly.  Air entry remains diminished.  Continue IV Solu-Medrol, nebs treatment etc.   Assessment & Plan:  Principal Problem:   Acute on chronic respiratory failure with hypoxia and hypercapnia (HCC) Active Problems:   COPD mixed type (HCC)   Hypertension   Type 2 diabetes mellitus without complication, without long-term current use of insulin (HCC)   COPD with acute exacerbation (HCC)   Multiple lung nodules on CT     Assessment and Plan: Acute on chronic respiratory failure with hypoxia and hypercapnia (HCC) Acute COPD exacerbation - Continue IV Solu-Medrol and nebulizer treatment. -Continue other supportive care. -Counseled to quit tobacco use. -BNP negative.  Procalcitonin negative, discontinue Rocephin.  Complete total 5-day course of azithromycin - COVID-negative  Acute kidney injury, improving - Baseline creatinine 1.0, admission creatinine 1.36 now improving slowly 06/18/2022: AKI has resolved.  Serum creatinine is 1 today.  ETOH use -Librium taper.  -Continue CIWA protocol.    -No significant withdrawal symptoms noted.  Multiple lung nodules on CT Multiple pulm nodules seen June 2023 had completely resolved  on repeat CT in Feb 2024.  Suspicion now is that they were due to an infectious cause that resolved and not neoplastic.  No mention of pulm nodules on the Feb 2024 scan.  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  Sliding scale and Accu-Cheks. Holding home PO meds.  Increase Semglee 8U and Novolog 3U premeal added. Adjust prn.  Continue Lyrica 06/20/2022: Monitor blood sugar closely.  GERD - PPI  Hypertension Restart Cozaar 100 mg p.o. once daily.    -Restarted Cozaar 100 Mg p.o. once daily.   -Continue beta-blockers.  -Blood pressure is controlled.   DVT prophylaxis: Lovenox Code Status: Full code Family Communication:   Status is: Inpatient Continue hospital stay.  Significant abnormal breath sounds       Diet Orders (From admission, onward)     Start     Ordered   06/14/22 1103  Diet Carb Modified Fluid consistency: Thin; Room service appropriate? Yes  Diet effective now       Question Answer Comment  Diet-HS Snack? Nothing   Calorie Level Medium 1600-2000   Fluid consistency: Thin   Room service appropriate? Yes      06/14/22 1102            Subjective: No new complaints. No wheezing.   Examination: Constitutional: Patient is awake and alert.  Not in any distress.  No withdrawal symptoms noted.  Patient is obese. Respiratory: Improved air entry.     Cardiovascular: S1-S2. Abdomen: Obese, soft and nontender. Musculoskeletal: No edema noted Neurologic: Awake and alert.  Patient moves all extremities.  Objective: Vitals:   06/20/22 9562 06/20/22 0758 06/20/22 1308  06/20/22 1609  BP:   (!) 150/84 127/77  Pulse:   67 84  Resp:    17  Temp:    98 F (36.7 C)  TempSrc:    Oral  SpO2: 97% 97%  96%  Weight:      Height:        Intake/Output Summary (Last 24 hours) at 06/20/2022 1705 Last data filed at 06/20/2022 1156 Gross per 24 hour  Intake  240 ml  Output 3595 ml  Net -3355 ml    Filed Weights   06/14/22 0733 06/14/22 1752  Weight: 100.2 kg 96.6 kg    Scheduled Meds:  atorvastatin  10 mg Oral Daily   budesonide (PULMICORT) nebulizer solution  0.5 mg Nebulization BID   enoxaparin (LOVENOX) injection  40 mg Subcutaneous Q24H   feeding supplement (GLUCERNA SHAKE)  237 mL Oral BID BM   folic acid  1 mg Oral Daily   furosemide  40 mg Oral Daily   insulin aspart  0-15 Units Subcutaneous TID WC   insulin aspart  0-5 Units Subcutaneous QHS   insulin aspart  3 Units Subcutaneous TID WC   insulin glargine-yfgn  8 Units Subcutaneous Daily   ipratropium-albuterol  3 mL Nebulization BID   losartan  100 mg Oral Daily   methylPREDNISolone (SOLU-MEDROL) injection  40 mg Intravenous Q8H   metoprolol succinate  50 mg Oral Daily   multivitamin with minerals  1 tablet Oral Daily   nicotine  21 mg Transdermal Daily   pantoprazole  40 mg Oral QAC breakfast   pregabalin  100 mg Oral BID   thiamine  100 mg Oral Daily   Or   thiamine  100 mg Intravenous Daily   Continuous Infusions:    Nutritional status Signs/Symptoms: estimated needs Interventions: Glucerna shake, MVI Body mass index is 31.45 kg/m.  Data Reviewed:   CBC: Recent Labs  Lab 06/14/22 0118 06/14/22 0136 06/15/22 2352 06/16/22 2338 06/18/22 0001 06/18/22 2356 06/20/22 0009  WBC 13.9*   < > 13.2* 13.2* 13.7* 13.1* 13.5*  NEUTROABS 9.4*  --   --   --   --   --   --   HGB 12.8*   < > 11.3* 11.9* 12.2* 11.4* 12.0*  HCT 40.9   < > 35.9* 38.3* 39.0 37.0* 38.0*  MCV 94.2   < > 95.2 95.5 95.1 93.2 92.7  PLT 316   < > 236 234 234 226 244   < > = values in this interval not displayed.    Basic Metabolic Panel: Recent Labs  Lab 06/15/22 2352 06/16/22 2338 06/18/22 0001 06/18/22 2356 06/20/22 0009  NA 136 137 137 137 133*  K 4.9 4.8 4.8 4.7 4.4  CL 99 98 96* 96* 90*  CO2 29 31 32 33* 35*  GLUCOSE 174* 139* 143* 188* 208*  BUN 35* 34* 33* 30* 29*   CREATININE 1.23 1.02 1.04 1.00 1.00  CALCIUM 8.9 9.8 9.4 9.0 9.2  MG 2.5* 2.3 2.3 2.3 2.2    GFR: Estimated Creatinine Clearance: 89 mL/min (by C-G formula based on SCr of 1 mg/dL). Liver Function Tests: No results for input(s): "AST", "ALT", "ALKPHOS", "BILITOT", "PROT", "ALBUMIN" in the last 168 hours.  No results for input(s): "LIPASE", "AMYLASE" in the last 168 hours. No results for input(s): "AMMONIA" in the last 168 hours. Coagulation Profile: No results for input(s): "INR", "PROTIME" in the last 168 hours. Cardiac Enzymes: No results for input(s): "CKTOTAL", "CKMB", "CKMBINDEX", "TROPONINI" in the last  168 hours. BNP (last 3 results) No results for input(s): "PROBNP" in the last 8760 hours. HbA1C: No results for input(s): "HGBA1C" in the last 72 hours.  CBG: Recent Labs  Lab 06/19/22 1612 06/19/22 2133 06/20/22 0620 06/20/22 1155 06/20/22 1609  GLUCAP 150* 208* 144* 226* 223*    Lipid Profile: No results for input(s): "CHOL", "HDL", "LDLCALC", "TRIG", "CHOLHDL", "LDLDIRECT" in the last 72 hours. Thyroid Function Tests: No results for input(s): "TSH", "T4TOTAL", "FREET4", "T3FREE", "THYROIDAB" in the last 72 hours. Anemia Panel: No results for input(s): "VITAMINB12", "FOLATE", "FERRITIN", "TIBC", "IRON", "RETICCTPCT" in the last 72 hours. Sepsis Labs: Recent Labs  Lab 06/15/22 0009  PROCALCITON <0.10     Recent Results (from the past 240 hour(s))  SARS Coronavirus 2 by RT PCR (hospital order, performed in [facility] hospital lab) *cepheid single result test* Anterior Nasal Swab     Status: None   Collection Time: 06/14/22  3:44 AM   Specimen: Anterior Nasal Swab  Result Value Ref Range Status   SARS Coronavirus 2 by RT PCR NEGATIVE NEGATIVE Final    Comment: Performed at [facility] Lab, [address] [place], [place] [address]         Radiology Studies: DG Abd 1 View  Result Date: 06/19/2022 CLINICAL DATA:  Abdominal distension and pain.  EXAM: ABDOMEN - 1 VIEW COMPARISON:  Abdominal radiograph dated 11/29/2021. FINDINGS: Evaluation is limited due to body habitus. There is large amount of stool throughout the colon. No bowel dilatation or evidence of obstruction. No free air or radiopaque calculi. The osseous structures are intact. The soft tissues unremarkable. IMPRESSION: Constipation.  No bowel obstruction. Electronically Signed   By: Elgie Collard M.D.   On: 06/19/2022 19:31           LOS: 6 days   Time spent= 35 mins    Barnetta Chapel, MD [facility]  If 7PM-7AM, please contact night-coverage  06/20/2022, 5:05 PM

## 2022-06-20 NOTE — Plan of Care (Signed)

## 2022-06-21 DIAGNOSIS — J9621 Acute and chronic respiratory failure with hypoxia: Secondary | ICD-10-CM | POA: Diagnosis not present

## 2022-06-21 DIAGNOSIS — J9622 Acute and chronic respiratory failure with hypercapnia: Secondary | ICD-10-CM | POA: Diagnosis not present

## 2022-06-21 LAB — BASIC METABOLIC PANEL
Anion gap: 10 (ref 5–15)
BUN: 38 mg/dL — ABNORMAL HIGH (ref 8–23)
CO2: 31 mmol/L (ref 22–32)
Calcium: 9 mg/dL (ref 8.9–10.3)
Chloride: 94 mmol/L — ABNORMAL LOW (ref 98–111)
Creatinine, Ser: 1.31 mg/dL — ABNORMAL HIGH (ref 0.61–1.24)
GFR, Estimated: >60 mL/min (ref >60)
Glucose, Bld: 260 mg/dL — ABNORMAL HIGH (ref 70–99)
Potassium: 4.9 mmol/L (ref 3.5–5.1)
Sodium: 135 mmol/L (ref 135–145)

## 2022-06-21 LAB — CBC
HCT: 39.7 % (ref 39.0–52.0)
Hemoglobin: 12.3 g/dL — ABNORMAL LOW (ref 13.0–17.0)
MCH: 28.9 pg (ref 26.0–34.0)
MCHC: 31 g/dL (ref 30.0–36.0)
MCV: 93.4 fL (ref 80.0–100.0)
Platelets: 248 10*3/uL (ref 150–400)
RBC: 4.25 MIL/uL (ref 4.22–5.81)
RDW: 14.4 % (ref 11.5–15.5)
WBC: 14.9 10*3/uL — ABNORMAL HIGH (ref 4.0–10.5)
nRBC: 0 % (ref 0.0–0.2)

## 2022-06-21 LAB — GLUCOSE, CAPILLARY: Glucose-Capillary: 176 mg/dL — ABNORMAL HIGH (ref 70–99)

## 2022-06-21 LAB — MAGNESIUM: Magnesium: 2.3 mg/dL (ref 1.7–2.4)

## 2022-06-21 MED ORDER — INSULIN GLARGINE-YFGN 100 UNIT/ML ~~LOC~~ SOLN
15.0000 [IU] | Freq: Every day | SUBCUTANEOUS | Status: DC
  Administered 2022-06-21: 15 [IU] via SUBCUTANEOUS
  Filled 2022-06-21: qty 0.15

## 2022-06-21 NOTE — Discharge Summary (Signed)
Physician Discharge Summary  James [name] ZOX:[id number] DOB: 1960-11-14 DOA: 06/14/2022  PCP: [name], MD  Admit date: 06/14/2022 Discharge date: 06/21/2022 30 Day Unplanned Readmission Risk Score    Flowsheet Row ED to Hosp-Admission (Current) from 06/14/2022 in [place] 2C CV PROGRESSIVE CARE  30 Day Unplanned Readmission Risk Score (%) 27.8 Filed at 06/21/2022 0801       This score is the patient's risk of an unplanned readmission within 30 days of being discharged (0 -100%). The score is based on dignosis, age, lab data, medications, orders, and past utilization.   Low:  0-14.9   Medium: 15-21.9   High: 22-29.9   Extreme: 30 and above          Admitted From: Home Disposition: Home  Recommendations for Outpatient Follow-up:  Follow up with PCP in 1-2 weeks Please obtain BMP/CBC in one week Please follow up with your PCP on the following pending results: Unresulted Labs (From admission, onward)    None         Home Health: None Equipment/Devices: None  Discharge Condition: Stable  CODE STATUS: Full code Diet recommendation: Cardiac  Subjective: Seen and examined.  He says that his breathing is much improved and he feels that he is back at baseline but he was complaining of generalized weakness.  Looks like he was seen by PT OT 2 or 3 days ago and he was thought to be at his baseline and PT did not recommend any home health PT OT.  Based on the weakness that I observed today, I offered him the assessment by PT OT because I do think that he may qualify and benefit from home health PT but patient declined.  He says that his sister will come pick him up.  Brief/Interim Summary: 62 year old with history of COPD, chronic hypoxia/hypercapnic respiratory failure, chronically on 5 L nasal cannula, recurrent pneumonia, alcohol use in remission, DM2, HTN presented to the ED with respiratory distress.  Upon admission noted to be in hypoxic and hypercapnic respiratory  failure requiring BiPAP.  Chest x-ray was negative besides multiple remote bilateral rib fractures.  Diagnosed and admitted with acute on chronic hypoxic and hypercapnic respiratory failure due to COPD exacerbation, started on Solu-Medrol and steroids, slowly improved and weaned off BiPAP.  Back at baseline now and feels comfortable going home.  Will resume PTA dose of prednisone that he was taking.   Acute kidney injury, resolved   ETOH use Did not have any withdrawal symptoms.  Was treated with Librium taper.   Multiple lung nodules on CT Multiple pulm nodules seen June 2023 had completely resolved on repeat CT in Feb 2024.  Suspicion now is that they were due to an infectious cause that resolved and not neoplastic.  Follow-up with PCP.   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   GERD - PPI   Hypertension Blood pressure controlled, resume home medications.   Discharge Diagnoses:  Principal Problem:   Acute on chronic respiratory failure with hypoxia and hypercapnia (HCC) Active Problems:   COPD with acute exacerbation (HCC)   COPD mixed type (HCC)   Hypertension   Type 2 diabetes mellitus without complication, without long-term current use of insulin (HCC)   Multiple lung nodules on CT    Discharge Instructions   Allergies as of 06/21/2022   No Known Allergies      Medication List  STOP taking these medications    Cholecalciferol 25 MCG (1000 UT) tablet       TAKE these medications    albuterol (2.5 MG/3ML) 0.083% nebulizer solution Commonly known as: PROVENTIL Take 3 mLs (2.5 mg total) by nebulization every 4 (four) hours as needed for wheezing or shortness of breath.   Ventolin HFA 108 (90 Base) MCG/ACT inhaler Generic drug: albuterol Inhale 2 puffs into the lungs every 6 (six) hours as needed for wheezing or shortness of breath.    atorvastatin 10 MG tablet Commonly known as: LIPITOR Take 1 tablet (10 mg total) by mouth daily.   Breztri Aerosphere 160-9-4.8 MCG/ACT Aero Generic drug: Budeson-Glycopyrrol-Formoterol Inhale 2 puffs into the lungs in the morning and at bedtime.   FreeStyle Libre 2 Reader Cache Use to check blood sugar continuously throughout the day.   FreeStyle Libre 2 Sensor Misc Use to check blood sugar continuously throughout the day.   furosemide 40 MG tablet Commonly known as: LASIX Take 1 tablet (40 mg total) by mouth daily.   glipiZIDE 5 MG 24 hr tablet Commonly known as: GLUCOTROL XL Take 1 tablet (5 mg total) by mouth daily with breakfast.   losartan 100 MG tablet Commonly known as: COZAAR Take 1 tablet (100 mg total) by mouth daily.   metoprolol succinate 50 MG 24 hr tablet Commonly known as: TOPROL-XL Take 1 tablet (50 mg total) by mouth daily. Take with or immediately following a meal.   nystatin cream Commonly known as: MYCOSTATIN Apply to affected areas 3 (three) times daily.   pantoprazole 40 MG tablet Commonly known as: PROTONIX Take 1 tablet (40 mg total) by mouth daily.   predniSONE 10 MG tablet Commonly known as: DELTASONE Take 1 tablet (10 mg total) by mouth daily.   pregabalin 100 MG capsule Commonly known as: Lyrica Take 1 capsule (100 mg total) by mouth 2 (two) times daily.        Follow-up Information     [name], MD Follow up in 1 week(s).   Specialty: Pulmonary Disease Contact information: [address] [place] [address] [phone]                No Known Allergies  Consultations: None   Procedures/Studies: DG Abd 1 View  Result Date: 06/19/2022 CLINICAL DATA:  Abdominal distension and pain. EXAM: ABDOMEN - 1 VIEW COMPARISON:  Abdominal radiograph dated 11/29/2021. FINDINGS: Evaluation is limited due to body habitus. There is large amount of stool throughout the colon. No bowel dilatation or evidence  of obstruction. No free air or radiopaque calculi. The osseous structures are intact. The soft tissues unremarkable. IMPRESSION: Constipation.  No bowel obstruction. Electronically Signed   By: Elgie Collard M.D.   On: 06/19/2022 19:31   DG Chest Port 1 View  Result Date: 06/14/2022 CLINICAL DATA:  Respiratory distress EXAM: PORTABLE CHEST 1 VIEW COMPARISON:  05/03/2022 FINDINGS: Multiple remote bilateral rib fractures are identified with resultant left chest wall deformity and left-sided volume loss. Stable left pleural thickening. Lungs are clear. No pneumothorax or pleural effusion. Cardiac size within normal limits. Pulmonary vascularity is normal. No acute bone abnormality. IMPRESSION: 1. No active disease. 2. Multiple remote bilateral rib fractures. Electronically Signed   By: Helyn Numbers M.D.   On: 06/14/2022 01:39     Discharge Exam: Vitals:   06/21/22 0331 06/21/22 0824  BP: 128/76 129/85  Pulse: 78 77  Resp: 13 15  Temp: 98.6 F (37 C) 98.7 F (  37.1 C)  SpO2: 94% 97%   Vitals:   06/20/22 2006 06/20/22 2307 06/21/22 0331 06/21/22 0824  BP: 121/75 102/68 128/76 129/85  Pulse: 88 86 78 77  Resp: 17 18 13 15   Temp: 97.6 F (36.4 C) 98 F (36.7 C) 98.6 F (37 C) 98.7 F (37.1 C)  TempSrc: Oral Oral Oral Oral  SpO2: 93% 93% 94% 97%  Weight:      Height:        General: Pt is alert, awake, not in acute distress Cardiovascular: RRR, S1/S2 +, no rubs, no gallops Respiratory: CTA bilaterally, no wheezing, no rhonchi Abdominal: Soft, NT, ND, bowel sounds + Extremities: no edema, no cyanosis    The results of significant diagnostics from this hospitalization (including imaging, microbiology, ancillary and laboratory) are listed below for reference.     Microbiology: Recent Results (from the past 240 hour(s))  SARS Coronavirus 2 by RT PCR (hospital order, performed in [facility] hospital lab) *cepheid single result test* Anterior Nasal Swab     Status: None    Collection Time: 06/14/22  3:44 AM   Specimen: Anterior Nasal Swab  Result Value Ref Range Status   SARS Coronavirus 2 by RT PCR NEGATIVE NEGATIVE Final    Comment: Performed at [facility], [address] [place], [place] [address]     Labs: BNP (last 3 results) Recent Labs    12/31/21 1002 05/03/22 0845 06/14/22 0118  BNP 103.9* 24.8 52.4   Basic Metabolic Panel: Recent Labs  Lab 06/16/22 2338 06/18/22 0001 06/18/22 2356 06/20/22 0009 06/21/22 0009  NA 137 137 137 133* 135  K 4.8 4.8 4.7 4.4 4.9  CL 98 96* 96* 90* 94*  CO2 31 32 33* 35* 31  GLUCOSE 139* 143* 188* 208* 260*  BUN 34* 33* 30* 29* 38*  CREATININE 1.02 1.04 1.00 1.00 1.31*  CALCIUM 9.8 9.4 9.0 9.2 9.0  MG 2.3 2.3 2.3 2.2 2.3   Liver Function Tests: No results for input(s): "AST", "ALT", "ALKPHOS", "BILITOT", "PROT", "ALBUMIN" in the last 168 hours. No results for input(s): "LIPASE", "AMYLASE" in the last 168 hours. No results for input(s): "AMMONIA" in the last 168 hours. CBC: Recent Labs  Lab 06/16/22 2338 06/18/22 0001 06/18/22 2356 06/20/22 0009 06/21/22 0009  WBC 13.2* 13.7* 13.1* 13.5* 14.9*  HGB 11.9* 12.2* 11.4* 12.0* 12.3*  HCT 38.3* 39.0 37.0* 38.0* 39.7  MCV 95.5 95.1 93.2 92.7 93.4  PLT 234 234 226 244 248   Cardiac Enzymes: No results for input(s): "CKTOTAL", "CKMB", "CKMBINDEX", "TROPONINI" in the last 168 hours. BNP: Invalid input(s): "POCBNP" CBG: Recent Labs  Lab 06/20/22 0620 06/20/22 1155 06/20/22 1609 06/20/22 2120 06/21/22 0613  GLUCAP 144* 226* 223* 220* 176*   D-Dimer No results for input(s): "DDIMER" in the last 72 hours. Hgb A1c No results for input(s): "HGBA1C" in the last 72 hours. Lipid Profile No results for input(s): "CHOL", "HDL", "LDLCALC", "TRIG", "CHOLHDL", "LDLDIRECT" in the last 72 hours. Thyroid function studies No results for input(s): "TSH", "T4TOTAL", "T3FREE", "THYROIDAB" in the last 72 hours.  Invalid input(s): "FREET3" Anemia  work up No results for input(s): "VITAMINB12", "FOLATE", "FERRITIN", "TIBC", "IRON", "RETICCTPCT" in the last 72 hours. Urinalysis    Component Value Date/Time   COLORURINE YELLOW 07/12/2021 0739   APPEARANCEUR CLEAR 07/12/2021 0739   LABSPEC 1.026 07/12/2021 0739   PHURINE 5.0 07/12/2021 0739   GLUCOSEU 150 (A) 07/12/2021 0739   HGBUR NEGATIVE 07/12/2021 0739   BILIRUBINUR NEGATIVE 07/12/2021  0739   BILIRUBINUR negative 09/25/2018 1432   KETONESUR NEGATIVE 07/12/2021 0739   PROTEINUR NEGATIVE 07/12/2021 0739   UROBILINOGEN 0.2 02/19/2019 1029   NITRITE NEGATIVE 07/12/2021 0739   LEUKOCYTESUR NEGATIVE 07/12/2021 0739   Sepsis Labs Recent Labs  Lab 06/18/22 0001 06/18/22 2356 06/20/22 0009 06/21/22 0009  WBC 13.7* 13.1* 13.5* 14.9*   Microbiology Recent Results (from the past 240 hour(s))  SARS Coronavirus 2 by RT PCR (hospital order, performed in [facility] hospital lab) *cepheid single result test* Anterior Nasal Swab     Status: None   Collection Time: 06/14/22  3:44 AM   Specimen: Anterior Nasal Swab  Result Value Ref Range Status   SARS Coronavirus 2 by RT PCR NEGATIVE NEGATIVE Final    Comment: Performed at [facility], [address], [place], [place] [address]    FURTHER DISCHARGE INSTRUCTIONS:   Get Medicines reviewed and adjusted: Please take all your medications with you for your next visit with your Primary MD   Laboratory/radiological data: Please request your Primary MD to go over all hospital tests and procedure/radiological results at the follow up, please ask your Primary MD to get all Hospital records sent to his/her office.   In some cases, they will be blood work, cultures and biopsy results pending at the time of your discharge. Please request that your primary care M.D. goes through all the records of your hospital data and follows up on these results.   Also Note the following: If you experience worsening of your admission symptoms,  develop shortness of breath, life threatening emergency, suicidal or homicidal thoughts you must seek medical attention immediately by calling 911 or calling your MD immediately  if symptoms less severe.   You must read complete instructions/literature along with all the possible adverse reactions/side effects for all the Medicines you take and that have been prescribed to you. Take any new Medicines after you have completely understood and accpet all the possible adverse reactions/side effects.    Do not drive when taking Pain medications or sleeping medications (Benzodaizepines)   Do not take more than prescribed Pain, Sleep and Anxiety Medications. It is not advisable to combine anxiety,sleep and pain medications without talking with your primary care practitioner   Special Instructions: If you have smoked or chewed Tobacco  in the last 2 yrs please stop smoking, stop any regular Alcohol  and or any Recreational drug use.   Wear Seat belts while driving.   Please note: You were cared for by a hospitalist during your hospital stay. Once you are discharged, your primary care physician will handle any further medical issues. Please note that NO REFILLS for any discharge medications will be authorized once you are discharged, as it is imperative that you return to your primary care physician (or establish a relationship with a primary care physician if you do not have one) for your post hospital discharge needs so that they can reassess your need for medications and monitor your lab values  Time coordinating discharge: Over 30 minutes  SIGNED:   Hughie Closs, MD  [facility] 06/21/2022, 9:30 AM *Please note that this is a verbal dictation therefore any spelling or grammatical errors are due to the "Dragon Medical One" system interpretation. If 7PM-7AM, please contact night-coverage [email or web address]

## 2022-06-21 NOTE — Plan of Care (Signed)
  Problem: Coping: Goal: Ability to adjust to condition or change in health will improve Outcome: Progressing   Problem: Fluid Volume: Goal: Ability to maintain a balanced intake and output will improve Outcome: Progressing   Problem: Health Behavior/Discharge Planning: Goal: Ability to identify and utilize available resources and services will improve Outcome: Progressing Goal: Ability to manage health-related needs will improve Outcome: Progressing   Problem: Metabolic: Goal: Ability to maintain appropriate glucose levels will improve Outcome: Progressing   Problem: Nutritional: Goal: Maintenance of adequate nutrition will improve Outcome: Progressing Goal: Progress toward achieving an optimal weight will improve Outcome: Progressing   Problem: Education: Goal: Knowledge of General Education information will improve Description: Including pain rating scale, medication(s)/side effects and non-pharmacologic comfort measures Outcome: Progressing

## 2022-06-21 NOTE — Progress Notes (Signed)
Patient had discharge paper work gone over and answered all questions, iv was removed, belongings gathered and patient wheel chaired out to discharge lounge.

## 2022-06-21 NOTE — Inpatient Diabetes Management (Signed)
Inpatient Diabetes Program Recommendations  AACE/ADA: New Consensus Statement on Inpatient Glycemic Control (2015)  Target Ranges:  Prepandial:   less than 140 mg/dL      Peak postprandial:   less than 180 mg/dL (1-2 hours)      Critically ill patients:  140 - 180 mg/dL   Lab Results  Component Value Date   GLUCAP 176 (H) 06/21/2022   HGBA1C 6.2 (H) 06/14/2022    Review of Glycemic Control  Diabetes history: DM2 Outpatient Diabetes medications: glipizide 5 mg QAM. On Pred 10 daily Current orders for Inpatient glycemic control: Semglee 15 every day, Novolog 0-15 TID with meals and 0-5 HS + 3 units TID  HgbA1C - 6.2%  Inpatient Diabetes Program Recommendations:    Spoke with pt at bedside regarding glucose monitoring. Pt states his pharmacy said insurance would not cover Josephine Igo that was ordered by PCP. Explained Josephine Igo would not be covered since pt is not on insulin. He will get meter for home to check blood sugars. Instructed to follow-up  with PCP  regarding his diabetes management. Pt denies any hypoglycemia. Said he eats sweets all the time. Encouraged healthy eating and discussed portion control with desserts. Pt appreciative of visit. Discussed with RN.  Thank you. Ailene Ards, RD, LDN, CDCES Inpatient Diabetes Coordinator [phone]

## 2022-06-22 ENCOUNTER — Telehealth: Payer: Self-pay | Admitting: *Deleted

## 2022-06-22 NOTE — Transitions of Care (Post Inpatient/ED Visit) (Signed)
06/22/2022  Name: James [name] MRN: [id number] DOB: 1960-05-25  Today's TOC FU Call Status: Today's TOC FU Call Status:: Successful TOC FU Call Competed TOC FU Call Complete Date: 06/22/22  Transition Care Management Follow-up Telephone Call Date of Discharge: 06/21/22 Discharge Facility: [facility]) Type of Discharge: Inpatient Admission Primary Inpatient Discharge Diagnosis:: Acute on chronic respiratory failure with hypoxia and hypercapnia How have you been since you were released from the hospital?: Better Any questions or concerns?: No  Items Reviewed: Did you receive and understand the discharge instructions provided?: Yes Medications obtained,verified, and reconciled?: Yes (Medications Reviewed) Any new allergies since your discharge?: No Dietary orders reviewed?: Yes Type of Diet Ordered:: carb modified Do you have support at home?: Yes People in Home: alone Name of Support/Comfort Primary Source: supportive sister/[name]  Medications Reviewed Today: Medications Reviewed Today     Reviewed by [name], RN (Registered Nurse) on 06/22/22 at [phone]  Med List Status: <None>   Medication Order Taking? Sig Documenting Provider Last Dose Status Informant  albuterol (PROVENTIL) (2.5 MG/3ML) 0.083% nebulizer solution [id number] Yes Take 3 mLs (2.5 mg total) by nebulization every 4 (four) hours as needed for wheezing or shortness of breath. [name], MD Taking Active Self, Pharmacy Records  albuterol (VENTOLIN HFA) 108 [phone] Base) MCG/ACT inhaler [id number] Yes Inhale 2 puffs into the lungs every 6 (six) hours as needed for wheezing or shortness of breath. [name], MD Taking Active Self, Pharmacy Records           Med Note (COFFELL, Marzella Schlein   Mon Jun 14, 2022 11:29 AM)    atorvastatin (LIPITOR) 10 MG tablet [id number] Yes Take 1 tablet (10 mg total) by mouth daily. [name], MD Taking Active Self, Pharmacy Records  Budeson-Glycopyrrol-Formoterol  [facility] AEROSPHERE) 160-9-4.8 MCG/ACT [name] [id number] Yes Inhale 2 puffs into the lungs in the morning and at bedtime. Omar Person, MD Taking Active Pharmacy Records, Self           Med Note (COFFELL, Marzella Schlein   Mon Jun 14, 2022 11:29 AM)    Continuous Glucose Receiver (FREESTYLE LIBRE 2 READER) [place] [id number] No Use to check blood sugar continuously throughout the day.  Patient not taking: Reported on 06/22/2022   [name], MD Not Taking Active Self, Pharmacy Records  Continuous Glucose Sensor (FREESTYLE [place] 2 SENSOR) [place] [id number] No Use to check blood sugar continuously throughout the day.  Patient not taking: Reported on 06/22/2022   [name], MD Not Taking Active Self, Pharmacy Records  furosemide (LASIX) 40 MG tablet [id number] Yes Take 1 tablet (40 mg total) by mouth daily. [name], MD Taking Active Self, Pharmacy Records  glipiZIDE (GLUCOTROL XL) 5 MG 24 hr tablet [id number] Yes Take 1 tablet (5 mg total) by mouth daily with breakfast. [name], MD Taking Active Self, Pharmacy Records  losartan (COZAAR) 100 MG tablet [id number] Yes Take 1 tablet (100 mg total) by mouth daily. [name], MD Taking Active Self, Pharmacy Records  metoprolol succinate (TOPROL-XL) 50 MG 24 hr tablet [id number] Yes Take 1 tablet (50 mg total) by mouth daily. Take with or immediately following a meal. [name], MD Taking Active Self, Pharmacy Records  nystatin cream (MYCOSTATIN) [id number] No Apply to affected areas 3 (three) times daily.  Patient not taking: Reported on 06/22/2022   [name], MD Not Taking Active Self, Pharmacy Records  pantoprazole (PROTONIX) 40 MG tablet [id number]  Yes Take 1 tablet (40 mg total) by mouth daily. [name], MD Taking Active Self, Pharmacy Records  predniSONE (DELTASONE) 10 MG tablet [id number] Yes Take 1 tablet (10 mg total) by mouth daily. [name], MD Taking Active Self, Pharmacy Records            Med Note (COFFELL, Marzella Schlein   Mon Jun 14, 2022 11:30 AM) Leanora [name] continuously   pregabalin [facility]) 100 MG capsule [id number] Yes Take 1 capsule (100 mg total) by mouth 2 (two) times daily. [name], MD Taking Active Self, Pharmacy Records            Home Care and Equipment/Supplies: Were Home Health Services Ordered?: NA Any new equipment or medical supplies ordered?: NA  Functional Questionnaire: Do you need assistance with bathing/showering or dressing?: No Do you need assistance with meal preparation?: No Do you need assistance with eating?: No Do you have difficulty maintaining continence: No Do you need assistance with getting out of bed/getting out of a chair/moving?: No Do you have difficulty managing or taking your medications?: No  Follow up appointments reviewed: PCP Follow-up appointment confirmed?: Yes Date of PCP follow-up appointment?: 06/29/22 Follow-up Provider: Dr. Delford Field Specialist [facility] Follow-up appointment confirmed?: NA Do you need transportation to your follow-up appointment?: Yes Transportation Need Intervention Addressed By:: Transportation Arranged Do you understand care options if your condition(s) worsen?: Yes-patient verbalized understanding  RNCM assisted with arranging transportation to Hospital follow up appointment with Dr. Delford Field on 06/29/22 at 10:50am. Transportation will pickup at 9:50 am. Mr. Scherr will call for return ride home.   RNCM advised patient to take all medications to PCP appointment.  [name] RN, BSN [place]  Managed [facility] RN Care Coordinator [phone]

## 2022-06-23 ENCOUNTER — Other Ambulatory Visit: Payer: Self-pay

## 2022-06-23 ENCOUNTER — Other Ambulatory Visit (HOSPITAL_COMMUNITY): Payer: Self-pay

## 2022-06-24 ENCOUNTER — Other Ambulatory Visit: Payer: Self-pay

## 2022-06-24 ENCOUNTER — Other Ambulatory Visit (HOSPITAL_COMMUNITY): Payer: Self-pay

## 2022-06-24 ENCOUNTER — Telehealth: Payer: Self-pay

## 2022-06-24 NOTE — Telephone Encounter (Signed)
Phone call received from [place] with  EMT [facility] [employer]. Florentina Addison was at the patient 's home and voiced that patient legs are very swollen. Patient voiced that he was slightly SOB , his was able to complete sentences and hold conversation without noted SOB. O2 sat were in the upper 90's. Katie voiced she was concerned and wanted to alert his PCP of her assessment. Patient confirmed with Florentina Addison that he is taking his Medication as instructed. PCP made aware.  Instructed to have patient take 80 mg daily of Lasix And he would reassess at the patient OV next week. Katie advised of PCP orders and informed patient.

## 2022-06-24 NOTE — Progress Notes (Signed)
Paramedicine Encounter    Patient ID: James [name], male    DOB: 12-25-1960, 62 y.o.   MRN: [id number]  Pt called me asking me to come by b/c his legs are very swollen that started yesterday.  He does not have scales to weigh self. Will have to get him some.  He reports more sob if he does too much but overall breathing is the same. He was able to speak in full sentences w/o issues. Pulse ox was high 90s.  He reports he noticed the swelling yesterday.  He got out of hosp Monday. He has edema up to his knees, his legs are very tight.  Reviewed his recent diet intake and its loaded with sodium. Bbq chicken canned foods, using more table salt when he eats. We talked about that and other things/seasonings to try.  He threw the salt shaker away. After I explained this was likely the cause of his swelling, he seemed to understand.  I contacted cassie at [facility] and she spoke with dr Delford Field and he wants pt to take 80mg  daily until he is seen in clinic next Tuesday.  There were no avail appointments for tomor.  He did take extra lasix last night, he said his stomach was bloated and after that he felt better.  He did report good urine output after taking the extra.  He did take 1 more lasix while I was there since he had just 1 today.   He denies any further symptoms. Lungs ok.   I advised him if breathing gets worse or swelling gets worse to call 911.  Will f/u after his clinic visit next week.    BP (!) 100/50   Pulse 91   Resp (!) 22   SpO2 95%    Patient Care Team: [name], MD as PCP - General (Pulmonary Disease) O'Neal, Ronnald Ramp, MD as PCP - Cardiology (Cardiology) [name], RN as Case Manager Gustavus Bryant, LCSW as [facility] Care Management (Licensed Clinical Social Worker)  Patient Active Problem List   Diagnosis Date Noted   Alcohol dependence in remission (HCC) 03/02/2022   Aortic root dilation (HCC) 03/02/2022   Vision changes 12/29/2021    Oropharyngeal dysphagia 12/29/2021   Onychomycosis 12/29/2021   Malnutrition of moderate degree 12/05/2021   Tinea cruris 07/21/2021   Multiple lung nodules on CT 06/11/2021   COPD with chronic bronchitis 02/01/2021   GERD (gastroesophageal reflux disease) 02/01/2021   Neuropathy 09/29/2020   COPD with acute exacerbation (HCC) 03/26/2020   Bronchiectasis (HCC) 09/24/2019   Type 2 diabetes mellitus without complication, without long-term current use of insulin (HCC) 07/24/2019   Other atopic dermatitis 05/01/2019   Chronic respiratory failure with hypoxia (HCC) 04/23/2019   Intellectual disability 04/23/2019   Hypertension 05/17/2016   Former tobacco use 04/06/2016   COPD mixed type (HCC) 03/26/2016   Acute on chronic respiratory failure with hypoxia and hypercapnia (HCC) 06/08/2013    Current Outpatient Medications:    albuterol (PROVENTIL) (2.5 MG/3ML) 0.083% nebulizer solution, Take 3 mLs (2.5 mg total) by nebulization every 4 (four) hours as needed for wheezing or shortness of breath., Disp: 270 mL, Rfl: 1   albuterol (VENTOLIN HFA) 108 (90 Base) MCG/ACT inhaler, Inhale 2 puffs into the lungs every 6 (six) hours as needed for wheezing or shortness of breath., Disp: 54 g, Rfl: 1   atorvastatin (LIPITOR) 10 MG tablet, Take 1 tablet (10 mg total) by mouth daily., Disp: 90 tablet, Rfl:  3   Budeson-Glycopyrrol-Formoterol (BREZTRI AEROSPHERE) 160-9-4.8 MCG/ACT AERO, Inhale 2 puffs into the lungs in the morning and at bedtime., Disp: 10.7 g, Rfl: 11   Continuous Glucose Receiver (FREESTYLE LIBRE 2 READER) DEVI, Use to check blood sugar continuously throughout the day. (Patient not taking: Reported on 06/22/2022), Disp: 1 each, Rfl: 0   Continuous Glucose Sensor (FREESTYLE LIBRE 2 SENSOR) MISC, Use to check blood sugar continuously throughout the day. (Patient not taking: Reported on 06/22/2022), Disp: 2 each, Rfl: 6   furosemide (LASIX) 40 MG tablet, Take 1 tablet (40 mg total) by mouth daily.,  Disp: 90 tablet, Rfl: 1   glipiZIDE (GLUCOTROL XL) 5 MG 24 hr tablet, Take 1 tablet (5 mg total) by mouth daily with breakfast., Disp: 90 tablet, Rfl: 1   losartan (COZAAR) 100 MG tablet, Take 1 tablet (100 mg total) by mouth daily., Disp: 90 tablet, Rfl: 2   metoprolol succinate (TOPROL-XL) 50 MG 24 hr tablet, Take 1 tablet (50 mg total) by mouth daily. Take with or immediately following a meal., Disp: 90 tablet, Rfl: 2   nystatin cream (MYCOSTATIN), Apply to affected areas 3 (three) times daily. (Patient not taking: Reported on 06/22/2022), Disp: 30 g, Rfl: 2   pantoprazole (PROTONIX) 40 MG tablet, Take 1 tablet (40 mg total) by mouth daily., Disp: 90 tablet, Rfl: 6   predniSONE (DELTASONE) 10 MG tablet, Take 1 tablet (10 mg total) by mouth daily., Disp: 90 tablet, Rfl: 6   pregabalin (LYRICA) 100 MG capsule, Take 1 capsule (100 mg total) by mouth 2 (two) times daily., Disp: 60 capsule, Rfl: 2 No Known Allergies    Social History   Socioeconomic History   Marital status: Divorced    Spouse name: Not on file   Number of children: Not on file   Years of education: Not on file   Highest education level: Not on file  Occupational History   Occupation: Curator   Occupation: Education administrator  Tobacco Use   Smoking status: Former    Packs/day: 1.50    Years: 40.00    Additional pack years: 0.00    Total pack years: 60.00    Types: Cigarettes    Quit date: 2014    Years since quitting: 10.4   Smokeless tobacco: Former    Types: Associate Professor Use: Never used  Substance and Sexual Activity   Alcohol use: Not Currently    Alcohol/week: 28.0 standard drinks of alcohol    Types: 28 Cans of beer per week    Comment: 4 beers a night previously   Drug use: Not Currently    Types: Marijuana    Comment: daily; quit using crack and methamphetamine about 5 months ago    Sexual activity: Not Currently    Partners: Female  Other Topics Concern   Not on file  Social History Narrative    Lives alone near [place]   Social Determinants of Health   Financial Resource Strain: Low Risk  (12/25/2020)   Overall Financial Resource Strain (CARDIA)    Difficulty of Paying Living Expenses: Not very hard  Recent Concern: Financial Resource Strain - High Risk (10/29/2020)   Overall Financial Resource Strain (CARDIA)    Difficulty of Paying Living Expenses: Hard  Food Insecurity: No Food Insecurity (04/19/2022)   Hunger Vital Sign    Worried About Running Out of Food in the Last Year: Never true    Ran Out of Food in the Last Year: Never true  Transportation Needs: Unmet Transportation Needs (05/25/2022)   PRAPARE - Administrator, Civil Service (Medical): Yes    Lack of Transportation (Non-Medical): Yes  Physical Activity: Inactive (12/11/2020)   Exercise Vital Sign    Days of Exercise per Week: 0 days    Minutes of Exercise per Session: 0 min  Stress: No Stress Concern Present (12/25/2020)   [name] of Occupational Health - Occupational Stress Questionnaire    Feeling of Stress : Not at all  Social Connections: Moderately Integrated (11/13/2020)   Social Connection and Isolation Panel [NHANES]    Frequency of Communication with Friends and Family: More than three times a week    Frequency of Social Gatherings with Friends and Family: Once a week    Attends Religious Services: 1 to 4 times per year    Active Member of [employer] or Organizations: Yes    Attends Banker Meetings: 1 to 4 times per year    Marital Status: Divorced  Intimate Partner Violence: Not At Risk (12/03/2021)   Humiliation, Afraid, Rape, and Kick questionnaire    Fear of Current or Ex-Partner: No    Emotionally Abused: No    Physically Abused: No    Sexually Abused: No    Physical Exam      Future Appointments  Date Time Provider Department Center  06/29/2022 10:50 AM [name], MD [facility] None  06/30/2022 10:15 AM [name], RN [facility] None   07/13/2022  9:45 AM Louann Sjogren, DPM [facility]  09/09/2022  9:50 AM [name], MD [facility] None       Kerry Hough, Paramedic [phone] [facility] Paramedic  06/24/22

## 2022-06-25 ENCOUNTER — Ambulatory Visit: Payer: Medicaid Other | Admitting: *Deleted

## 2022-06-27 NOTE — Progress Notes (Unsigned)
Established Patient Office Visit  Subjective   Patient ID: James [name], male    DOB: 17-Apr-1960  Age: 63 y.o. MRN: [id number]  No chief complaint on file.   06/29/21 Since the last visit patient's been doing well however he ran out of his losartan for 1 week.  He does complain of an itching rash on both arms.  He is painting the inside of his house he tries to stay from the out-of-doors and where the heat is.  CT of the chest was done and showed pulmonary nodules will need to be repeated in 6 months.  Pneumonia has resolved.  He has been compliant with his inhaled medications.  Breathing appears to be at baseline.  7/18 Yet another readmission since last OV: Patient had developed pneumonia increased cough shortness of breath here today for follow-up  Patient still coughing mucus white in nature some wheezing still.  Below is copy discharge summary  12/26 Patient seen as a posthospital visit and had yet another episode of pneumonia right lower lobe and required hospitalization. Below is a copy of the discharge summary and he has actually had 3 admissions now since July of this year Post Hosp visit   3 admits since 07/2021 OV Discharge date: 12/08/21 Discharge Physician: Rickey Barbara   PCP: [name], MD    Recommendations at discharge:    1.  Follow up with PCP in 1-2 weeks 2. Follow up with Cardiology as scheduled   Discharge Diagnoses: Principal Problem:   Respiratory failure (HCC) Active Problems:   COPD exacerbation (HCC)   Malnutrition of moderate degree   Non-STEMI (non-ST elevated myocardial infarction) (HCC)   Resolved Problems:   * No resolved hospital problems. *   Hospital Course: 62 year old gentleman from home, has history of COPD and chronic hypoxemic respiratory failure on 2 L oxygen at home.  He does have a history of type 2 diabetes, hypertension.  He was found with severe COPD exacerbation secondary to rhinovirus infection, intubated and  admitted to ICU.  Also found to have SVT , not Afib  1/25 Admit, intubated, started ABx, start cardizem gtt-->got hypotensive. Converted to SR but started on Neo.   11/26 Overnight transferred from [facility] ED to [facility] ICU  11/27 Remains intubated on Prop/Versed for sedation; increased secretions with high peak pressures on vent, Resp Cx sent.  11/28 Resp Cx with few gram + cocci in pairs. FiO2 needs down to 50%. Secretions improved, but ongoing breath stacking. Sedation to Precedex. Tolerating PSV. Extubated. Hypertensive, started on cleviprex + precedex  Cardiology following and diastolic CHF.  Ischemic evaluation with left heart cath planned for 12/4.   Assessment and Plan: Acute on chronic hypoxemic and hypercapnic respiratory failure: Acute COPD exacerbation due to rhinovirus infection as well as superimposed bacterial pneumonia: On baseline 2 L oxygen at home.  Extubated to 2 L of oxygen. Remained stable on transfer to floor Continued Brovana, Pulmicort and Yupelri.  On as needed bronchodilator therapy. Solu-Medrol, tapered off to prednisone Completed broad spec abx   Essential hypertension with hypertensive emergency: Initially hypotensive after intubation on vasopressors.  Then hypertensive.  Patient was on Cleviprex infusion. Currently blood pressure stabilized on blood pressure and Cozaar.  Given lasix   Recurrent SVT:  Echo with ejection fraction 50 to 55%.  Cardiology following.  Currently in sinus rhythm.  On metoprolol, aspirin.  Improved.   Type 2 diabetes poor control, steroid-induced hyperglycemia: Fairly stable.  Not on treatment at home.  Elevated troponins with T wave depression: Respiratory status improving.  Cardiology had been following. Underwent clear heart cath 12/4. Cardiology recs for toprol xl 50mg . No need for ASA. Cardiology to f/u in 4 weeks   Clinically improving.   -Continue to work with PT OT.   -PT recommended SNF but patient reportedly felt like he can  go home.   -Eager to go home   Rash -pruritic rash in gluteal region, groin, and also over L forarm (areas pt is scratching) -Rash appears fungal -continue topical antifungal   Patient still has rash in the groin area and on buttocks.  He was given topical antifungal only.  He still coughing up yellow mucus still doing some wheezing.  He needs a follow-up chest x-ray.  He has completed his course of antibiotics is now down to the 10 mg daily prednisone.  Blood pressure is elevated on arrival 157/96.  He is now on metoprolol and low-dose losartan for blood pressure.  Still maintains the oral glipizide for diabetes.  He no longer has any home care.  He is back in the original housing has had before.  He does not drink alcohol is not smoking cigarettes.  He is chewing tobacco.  He is having trouble with swallowing and choking with this.  He needs a follow-up eye exam and needs his foot examined today.  Patient also notes vision changes left eye  03/02/22 Patient seen in return follow-up has not been back to the hospital except for a brief emergency room visit end of December.  Patient COPD is at baseline.  He just saw pulmonary medicine has an FEV1 of 40% predicted and a very low diffusion capacity due to centrilobular emphysema.  He is compliant with the [place] he.  He is using oxygen at bedtime as needed during the day.  On arrival saturation 94% room air.  Patient weight is up and is a BMI 31.6.  Blood sugar on arrival 164.  Patient takes the glipizide daily.  He is not on lipids therapy needs to be started.  Blood pressure on arrival is good 128/60.  Patient is not smoking or drinking alcohol.  He needs an A1c and other labs rechecked at this visit.  There are no other complaints.  06/08/22 Patient seen and return follow-up had previously been seen in early May for COPD exacerbation however he is now improved and stable. Patient needs refills on multiple medication oxygen is improved.  06/29/22   TOC Expand All Collapse All   Physician Discharge Summary James [name] ZOX:[id number] DOB: [birth year] DOA: 06/14/2022   PCP: [name], MD   Admit date: 06/14/2022 Discharge date: 06/21/2022  30 Day Unplanned Readmission Risk Score     Flowsheet Row ED to Hosp-Admission (Current) from 06/14/2022 in [place] 2C CV PROGRESSIVE CARE 30 Day Unplanned Readmission Risk Score (%) 27.8 Filed at 06/21/2022 0801           This score is the patient's risk of an unplanned readmission within 30 days of being discharged (0 -100%). The score is based on dignosis, age, lab data, medications, orders, and past utilization.   Low:  0-14.9   Medium: 15-21.9   High: 22-29.9   Extreme: 30 and above                Admitted From: Home Disposition: Home   Recommendations for Outpatient Follow-up:  1. Follow up with PCP in 1-2 weeks 2. Please obtain BMP/CBC in one week 3. Please  follow up with your PCP on the following pending results:  Unresulted Labs (From admission, onward)      None            Home Health: None Equipment/Devices: None   Discharge Condition: Stable  CODE STATUS: Full code Diet recommendation: Cardiac   Subjective: Seen and examined.  He says that his breathing is much improved and he feels that he is back at baseline but he was complaining of generalized weakness.  Looks like he was seen by PT OT 2 or 3 days ago and he was thought to be at his baseline and PT did not recommend any home health PT OT.  Based on the weakness that I observed today, I offered him the assessment by PT OT because I do think that he may qualify and benefit from home health PT but patient declined.  He says that his sister will come pick him up.   Brief/Interim Summary: 62 year old with history of COPD, chronic hypoxia/hypercapnic respiratory failure, chronically on 5 L nasal cannula, recurrent pneumonia, alcohol use in remission, DM2, HTN presented to the ED with respiratory  distress.  Upon admission noted to be in hypoxic and hypercapnic respiratory failure requiring BiPAP.  Chest x-ray was negative besides multiple remote bilateral rib fractures.  Diagnosed and admitted with acute on chronic hypoxic and hypercapnic respiratory failure due to COPD exacerbation, started on Solu-Medrol and steroids, slowly improved and weaned off BiPAP.  Back at baseline now and feels comfortable going home.  Will resume PTA dose of prednisone that he was taking.   Acute kidney injury, resolved   ETOH use Did not have any withdrawal symptoms.  Was treated with Librium taper.   Multiple lung nodules on CT Multiple pulm nodules seen June 2023 had completely resolved on repeat CT in Feb 2024.  Suspicion now is that they were due to an infectious cause that resolved and not neoplastic.  Follow-up with PCP.   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   GERD - PPI   Hypertension Blood pressure controlled, resume home medications.     Discharge Diagnoses:  Principal Problem:   Acute on chronic respiratory failure with hypoxia and hypercapnia (HCC) Active Problems:   COPD with acute exacerbation (HCC)   COPD mixed type (HCC)   Hypertension   Type 2 diabetes mellitus without complication, without long-term current use of insulin (HCC)   Multiple lung nodules on CT    Past medical history reviewed no changes made  Review of Systems  Constitutional:  Negative for chills, diaphoresis, fever, malaise/fatigue and weight loss.  HENT:  Negative for congestion, hearing loss, nosebleeds, sore throat and tinnitus.   Eyes:  Negative for blurred vision, photophobia and redness.  Respiratory:  Negative for cough, hemoptysis, sputum production, shortness of breath, wheezing and stridor.        White  Cardiovascular:  Negative for chest pain, palpitations, orthopnea,  claudication, leg swelling and PND.  Gastrointestinal:  Negative for abdominal pain, blood in stool, constipation, diarrhea, heartburn, nausea and vomiting.  Genitourinary:  Negative for dysuria, flank pain, frequency, hematuria and urgency.  Musculoskeletal:  Negative for back pain, falls, joint pain, myalgias and neck pain.  Skin:  Negative for itching and rash.  Neurological:  Negative for dizziness, tingling, tremors, sensory change, speech change, focal weakness, seizures, loss of consciousness, weakness and headaches.  Endo/Heme/Allergies:  Negative for environmental allergies  and polydipsia. Does not bruise/bleed easily.  Psychiatric/Behavioral:  Negative for depression, memory loss, substance abuse and suicidal ideas. The patient is not nervous/anxious and does not have insomnia.       Objective:     There were no vitals taken for this visit.   Physical Exam Vitals reviewed.  Constitutional:      Appearance: Normal appearance. He is well-developed. He is obese. He is not diaphoretic.  HENT:     Head: Normocephalic and atraumatic.     Nose: No nasal deformity, septal deviation, mucosal edema or rhinorrhea.     Right Sinus: No maxillary sinus tenderness or frontal sinus tenderness.     Left Sinus: No maxillary sinus tenderness or frontal sinus tenderness.     Mouth/Throat:     Pharynx: No oropharyngeal exudate.  Eyes:     General: No scleral icterus.    Conjunctiva/sclera: Conjunctivae normal.     Pupils: Pupils are equal, round, and reactive to light.  Neck:     Thyroid: No thyromegaly.     Vascular: No carotid bruit or JVD.     Trachea: Trachea normal. No tracheal tenderness or tracheal deviation.  Cardiovascular:     Rate and Rhythm: Normal rate and regular rhythm.     Chest Wall: PMI is not displaced.     Pulses: Normal pulses. No decreased pulses.     Heart sounds: Normal heart sounds, S1 normal and S2 normal. Heart sounds not distant. No murmur heard.    No  systolic murmur is present.     No diastolic murmur is present.     No friction rub. No gallop. No S3 or S4 sounds.  Pulmonary:     Effort: Pulmonary effort is normal. No tachypnea, accessory muscle usage or respiratory distress.     Breath sounds: No stridor. No decreased breath sounds, wheezing, rhonchi or rales.     Comments: Distant breath sounds Chest:     Chest wall: No tenderness.  Abdominal:     General: Bowel sounds are normal. There is no distension.     Palpations: Abdomen is soft. Abdomen is not rigid.     Tenderness: There is no abdominal tenderness. There is no guarding or rebound.  Musculoskeletal:        General: Normal range of motion.     Cervical back: Normal range of motion and neck supple. No edema, erythema or rigidity. No muscular tenderness. Normal range of motion.  Lymphadenopathy:     Head:     Right side of head: No submental or submandibular adenopathy.     Left side of head: No submental or submandibular adenopathy.     Cervical: No cervical adenopathy.  Skin:    General: Skin is warm and dry.     Coloration: Skin is not pale.     Findings: No rash.     Nails: There is no clubbing.     Comments: Erythematous rash both forearms is improved  Groin rash compatible with tinea cruris  Bilateral onychomycosis both feet  Neurological:     Mental Status: He is alert and oriented to person, place, and time.     Sensory: No sensory deficit.  Psychiatric:        Speech: Speech normal.        Behavior: Behavior normal.     No results found for any visits on 06/29/22.       The ASCVD Risk score (Arnett DK, et al., 2019) failed to calculate for  the following reasons:   The patient has a prior MI or stroke diagnosis    Assessment & Plan:   Problem List Items Addressed This Visit   None No follow-ups on file.    [name], MD

## 2022-06-28 ENCOUNTER — Telehealth (HOSPITAL_COMMUNITY): Payer: Self-pay

## 2022-06-29 ENCOUNTER — Encounter: Payer: Self-pay | Admitting: Critical Care Medicine

## 2022-06-29 ENCOUNTER — Other Ambulatory Visit: Payer: Self-pay

## 2022-06-29 ENCOUNTER — Ambulatory Visit: Payer: Medicaid Other | Attending: Critical Care Medicine | Admitting: Critical Care Medicine

## 2022-06-29 ENCOUNTER — Telehealth: Payer: Self-pay

## 2022-06-29 VITALS — BP 122/71 | HR 66 | Wt 217.0 lb

## 2022-06-29 DIAGNOSIS — E119 Type 2 diabetes mellitus without complications: Secondary | ICD-10-CM

## 2022-06-29 DIAGNOSIS — I10 Essential (primary) hypertension: Secondary | ICD-10-CM

## 2022-06-29 DIAGNOSIS — J9621 Acute and chronic respiratory failure with hypoxia: Secondary | ICD-10-CM

## 2022-06-29 DIAGNOSIS — J441 Chronic obstructive pulmonary disease with (acute) exacerbation: Secondary | ICD-10-CM | POA: Diagnosis not present

## 2022-06-29 DIAGNOSIS — Z7984 Long term (current) use of oral hypoglycemic drugs: Secondary | ICD-10-CM

## 2022-06-29 DIAGNOSIS — J9622 Acute and chronic respiratory failure with hypercapnia: Secondary | ICD-10-CM

## 2022-06-29 DIAGNOSIS — J9611 Chronic respiratory failure with hypoxia: Secondary | ICD-10-CM | POA: Diagnosis not present

## 2022-06-29 DIAGNOSIS — G629 Polyneuropathy, unspecified: Secondary | ICD-10-CM

## 2022-06-29 MED ORDER — FUROSEMIDE 40 MG PO TABS
ORAL_TABLET | ORAL | 1 refills | Status: DC
  Filled 2022-06-29: qty 90, 86d supply, fill #0

## 2022-06-29 MED ORDER — FUROSEMIDE 40 MG PO TABS: ORAL_TABLET | ORAL | 1 refills | Status: DC

## 2022-06-29 MED ORDER — PREDNISONE 10 MG PO TABS: ORAL_TABLET | ORAL | 6 refills | Status: DC

## 2022-06-29 NOTE — Telephone Encounter (Signed)
Patient in need of O2 to return home from the clinic.  His portable tank is running low.  I spoke to [facility] at 1140 and informed her of the need for O2 to be delivered to the clinic so the patient can safely return home. She said she would work on it and keep me updated on the progress of that request.

## 2022-06-29 NOTE — Assessment & Plan Note (Signed)
Get the patient freestyle libre attached pharmacy saw the patient at this visit

## 2022-06-29 NOTE — Assessment & Plan Note (Signed)
Still oxygen dependent need to get an additional tank from [facility]

## 2022-06-29 NOTE — Telephone Encounter (Signed)
Patient received O2 tank from [facility] representative and is ready to go home.

## 2022-06-29 NOTE — Telephone Encounter (Signed)
I called pt several times today to f/u to see how he is feeling after our visit last week with his edema to his legs.  He did not answer any of the times I called.  He has PCP appointment tomor, will f/u after that.    Kerry Hough, EMT-Paramedic  [phone] 06/28/2022

## 2022-06-29 NOTE — Assessment & Plan Note (Signed)
Continue Lyrica. 

## 2022-06-29 NOTE — Assessment & Plan Note (Addendum)
Pulse steroid

## 2022-06-29 NOTE — Assessment & Plan Note (Signed)
Blood pressure at goal continue with the losartan

## 2022-06-29 NOTE — Patient Instructions (Signed)
Lab work today  Increase lasix to two daily for 4 days then one daily  Increase prednisone to 4 a day for 5 days then one a day  Get back on breztri   Can use combivent 2 puff 4 times daily as needed  Return Dr Delford Field 2 weeks

## 2022-06-30 ENCOUNTER — Other Ambulatory Visit (HOSPITAL_COMMUNITY): Payer: Self-pay

## 2022-06-30 ENCOUNTER — Encounter: Payer: Self-pay | Admitting: *Deleted

## 2022-06-30 ENCOUNTER — Other Ambulatory Visit: Payer: Self-pay

## 2022-06-30 ENCOUNTER — Other Ambulatory Visit: Payer: Self-pay | Admitting: Pharmacist

## 2022-06-30 ENCOUNTER — Other Ambulatory Visit: Payer: Medicaid Other | Admitting: *Deleted

## 2022-06-30 LAB — COMPREHENSIVE METABOLIC PANEL
ALT: 24 IU/L (ref 0–44)
AST: 19 IU/L (ref 0–40)
Albumin: 4.1 g/dL (ref 3.9–4.9)
Alkaline Phosphatase: 51 IU/L (ref 44–121)
BUN/Creatinine Ratio: 19 (ref 10–24)
BUN: 26 mg/dL (ref 8–27)
Bilirubin Total: 0.2 mg/dL (ref 0.0–1.2)
CO2: 31 mmol/L — ABNORMAL HIGH (ref 20–29)
Calcium: 9.6 mg/dL (ref 8.6–10.2)
Chloride: 100 mmol/L (ref 96–106)
Creatinine, Ser: 1.35 mg/dL — ABNORMAL HIGH (ref 0.76–1.27)
Globulin, Total: 1.9 g/dL (ref 1.5–4.5)
Glucose: 114 mg/dL — ABNORMAL HIGH (ref 70–99)
Potassium: 4.5 mmol/L (ref 3.5–5.2)
Sodium: 144 mmol/L (ref 134–144)
Total Protein: 6 g/dL (ref 6.0–8.5)
eGFR: 60 mL/min/{1.73_m2} (ref >59)

## 2022-06-30 LAB — CBC WITH DIFFERENTIAL/PLATELET
Basophils Absolute: 0 10*3/uL (ref 0.0–0.2)
Basos: 0 %
EOS (ABSOLUTE): 0.2 10*3/uL (ref 0.0–0.4)
Eos: 1 %
Hematocrit: 35.1 % — ABNORMAL LOW (ref 37.5–51.0)
Hemoglobin: 11.4 g/dL — ABNORMAL LOW (ref 13.0–17.7)
Immature Grans (Abs): 0 10*3/uL (ref 0.0–0.1)
Immature Granulocytes: 0 %
Lymphocytes Absolute: 1.2 10*3/uL (ref 0.7–3.1)
Lymphs: 9 %
MCH: 29.4 pg (ref 26.6–33.0)
MCHC: 32.5 g/dL (ref 31.5–35.7)
MCV: 91 fL (ref 79–97)
Monocytes Absolute: 1.2 10*3/uL — ABNORMAL HIGH (ref 0.1–0.9)
Monocytes: 9 %
Neutrophils Absolute: 10.8 10*3/uL — ABNORMAL HIGH (ref 1.4–7.0)
Neutrophils: 81 %
Platelets: 267 10*3/uL (ref 150–450)
RBC: 3.88 x10E6/uL — ABNORMAL LOW (ref 4.14–5.80)
RDW: 13.2 % (ref 11.6–15.4)
WBC: 13.5 10*3/uL — ABNORMAL HIGH (ref 3.4–10.8)

## 2022-06-30 MED ORDER — PREDNISONE 10 MG PO TABS
ORAL_TABLET | ORAL | 6 refills | Status: DC
  Filled 2022-06-30: qty 90, fill #0
  Filled 2022-06-30: qty 45, 30d supply, fill #0

## 2022-06-30 NOTE — Patient Outreach (Signed)
Medicaid Managed Care   Nurse Care Manager Note  06/30/2022 Name:  James [name] MRN:  [id number] DOB:  25-Feb-1960  James [name] is an 62 y.o. year old male who is a primary patient of James Field Charlcie Cradle, MD.  The [facility] Managed Care Coordination team was consulted for assistance with:    COPD  James [name] was given information about Medicaid Managed Care Coordination team services today. James [name] Patient agreed to services and verbal consent obtained.  Engaged with patient by telephone for follow up visit in response to provider referral for case management and/or care coordination services.   Assessments/Interventions:  Review of past medical history, allergies, medications, health status, including review of consultants reports, laboratory and other test data, was performed as part of comprehensive evaluation and provision of chronic care management services.  SDOH (Social Determinants of Health) assessments and interventions performed: SDOH Interventions    Flowsheet Row Patient Outreach Telephone from 06/30/2022 in [place] POPULATION HEALTH DEPARTMENT Patient Outreach Telephone from 05/25/2022 in [place] POPULATION HEALTH DEPARTMENT Patient Outreach Telephone from 04/19/2022 in [place] POPULATION HEALTH DEPARTMENT Patient Outreach Telephone from 04/14/2022 in [place] POPULATION HEALTH DEPARTMENT Patient Outreach Telephone from 03/19/2022 in [place] POPULATION HEALTH DEPARTMENT Patient Outreach Telephone from 02/17/2022 in [place] POPULATION HEALTH DEPARTMENT  SDOH Interventions        Food Insecurity Interventions -- -- Intervention Not Indicated -- -- Intervention Not Indicated  Housing Interventions -- Intervention Not Indicated -- -- -- Intervention Not Indicated  Transportation Interventions Payor Benefit  [assisted with arranging transportation] Payor Benefit  [Assisted with arranging transportation to upcoming appointments] -- Payor Benefit  [facility] assisted  with arranging transportation with UHC] Payor Benefit  [assisted with arranging transportation to upcoming appointments] Payor Benefit  [Assisted with arranging transportation]  Utilities Interventions -- -- Intervention Not Indicated -- -- Other (Comment)  [Collaborated with BSW for utility assistance]       Care Plan  No Known Allergies  Medications Reviewed Today     Reviewed by James [name] (Registered Nurse) on 06/30/22 at 1022  Med List Status: <None>   Medication Order Taking? Sig Documenting Provider Last Dose Status Informant  albuterol (PROVENTIL) (2.5 MG/3ML) 0.083% nebulizer solution [id number]  Take 3 mLs (2.5 mg total) by nebulization every 4 (four) hours as needed for wheezing or shortness of breath. [name], MD  Active Self, Pharmacy Records  albuterol (VENTOLIN HFA) [phone] Base) MCG/ACT inhaler [id number]  Inhale 2 puffs into the lungs every 6 (six) hours as needed for wheezing or shortness of breath. [name], MD  Active Self, Pharmacy Records           Med Note (COFFELL, Marzella Schlein   Mon Jun 14, 2022 11:29 AM)    atorvastatin (LIPITOR) 10 MG tablet [id number]  Take 1 tablet (10 mg total) by mouth daily. [name], MD  Active Self, Pharmacy Records  Budeson-Glycopyrrol-Formoterol [facility] AEROSPHERE) 160-9-4.8 MCG/ACT [name] [id number]  Inhale 2 puffs into the lungs in the morning and at bedtime. Omar Person, MD  Active Pharmacy Records, Self           Med Note (COFFELL, Marzella Schlein   Mon Jun 14, 2022 11:29 AM)    Continuous Glucose Receiver (FREESTYLE LIBRE 2 READER) DEVI [id number]  Use to check blood sugar continuously throughout the day. [name], MD  Active Self, Pharmacy Records  Continuous Glucose Sensor (FREESTYLE LIBRE 2 SENSOR) MISC  [id number]  Use to check blood sugar continuously throughout the day. [name], MD  Active Self, Pharmacy Records  furosemide (LASIX) 40 MG tablet [id number]  Take 2 tablets (80 mg total)  by mouth daily for 4 days, THEN 1 tablet (40 mg total) daily. [name], MD  Active   glipiZIDE (GLUCOTROL XL) 5 MG 24 hr tablet [id number]  Take 1 tablet (5 mg total) by mouth daily with breakfast. [name], MD  Active Self, Pharmacy Records  losartan (COZAAR) 100 MG tablet [id number]  Take 1 tablet (100 mg total) by mouth daily. [name], MD  Active Self, Pharmacy Records  metoprolol succinate (TOPROL-XL) 50 MG 24 hr tablet [id number]  Take 1 tablet (50 mg total) by mouth daily. Take with or immediately following a meal. [name], MD  Active Self, Pharmacy Records  nystatin cream (MYCOSTATIN) [id number]  Apply to affected areas 3 (three) times daily. [name], MD  Active Self, Pharmacy Records  pantoprazole (PROTONIX) 40 MG tablet [id number]  Take 1 tablet (40 mg total) by mouth daily. [name], MD  Active Self, Pharmacy Records  predniSONE (DELTASONE) 10 MG tablet [id number]  Take 4 tablets (40 mg total) by mouth daily for 5 days, THEN 1 tablet (10 mg total) daily. [name], MD  Active   pregabalin (LYRICA) 100 MG capsule [id number]  Take 1 capsule (100 mg total) by mouth 2 (two) times daily. [name], MD  Active Self, Pharmacy Records            Patient Active Problem List   Diagnosis Date Noted   Alcohol dependence in remission [facility]) 03/02/2022   Aortic root dilation (HCC) 03/02/2022   Vision changes 12/29/2021   Oropharyngeal dysphagia 12/29/2021   Onychomycosis 12/29/2021   Malnutrition of moderate degree 12/05/2021   Tinea cruris 07/21/2021   Multiple lung nodules on CT 06/11/2021   COPD with chronic bronchitis 02/01/2021   GERD (gastroesophageal reflux disease) 02/01/2021   Neuropathy 09/29/2020   COPD with acute exacerbation (HCC) 03/26/2020   Bronchiectasis (HCC) 09/24/2019   Type 2 diabetes mellitus without complication, without long-term current use of insulin (HCC) 07/24/2019   Other atopic dermatitis  05/01/2019   Chronic respiratory failure with hypoxia (HCC) 04/23/2019   Intellectual disability 04/23/2019   Hypertension 05/17/2016   Former tobacco use 04/06/2016   COPD mixed type (HCC) 03/26/2016   Acute on chronic respiratory failure with hypoxia and hypercapnia (HCC) 06/08/2013    Conditions to be addressed/monitored per PCP order:  COPD  Care Plan : [name] Care Manager Plan of Care  Updates made by James [name] since 06/30/2022 12:00 AM     Problem: Chronic Disease Management needs in patient with COPD   Priority: High     Long-Range Goal: Plan of care established for for Chronic Disease management for patient with COPD   Start Date: 11/13/2020  Expected End Date: 09/03/2022  Priority: High  Note:   Current Barriers:  Care Coordination needs related to Limited access to food  Chronic Disease Management support and education needs related to COPD Mr. Staubs was unable to pick up medication refills yesterday due to an IT issue at the pharmacy. He needs transportation arranged to PCP visit in July.  RNCM Clinical Goal(s):  Patient will verbalize understanding of plan for management of COPD as evidenced by taking all medications as prescribed, attending all scheduled appointments and contacting provider with questions and concerns take all  medications exactly as prescribed and will call provider for medication related questions as evidenced by refilling medications and taking as prescribed    attend all scheduled medical appointments: 05/13/22 with Cardiology and 06/09/22 with PCP as evidenced by provider notes in EMR        demonstrate improved adherence to prescribed treatment plan for COPD as evidenced by taking all medications as prescribed, attending all scheduled appointments and contacting provider with questions and concerns continue to work with [name] Care Manager and/or Social Worker to address care management and care coordination needs related to COPD as evidenced by  adherence to CM Team Scheduled appointments     through collaboration with Medical illustrator, provider, and care team.   Interventions: Inter-disciplinary care team collaboration (see longitudinal plan of care) Evaluation of current treatment plan related to  self management and patient's adherence to plan as established by provider Reviewed and assisted with transportation arrangements to upcoming appointment,  07/13/22 pick up time 8:45 am going to [facility], will call for return ride home [phone], 07/14/22 pick up time 8:30 am going to Dr. Lynelle Doctor office, will call for return ride home, reference [phone] Reviewed medications, contacted pharmacy to request delivery of needed medications Discussed dosing changes made on 06/29/22 in detail for furosemide and prednisone   SDOH Barriers (Status:  Goal on track:  Yes. Patient interviewed and SDOH assessment performed        SDOH Interventions    Flowsheet Row Patient Outreach Telephone from 06/30/2022 in [place] POPULATION HEALTH DEPARTMENT Patient Outreach Telephone from 05/25/2022 in [place] POPULATION HEALTH DEPARTMENT Patient Outreach Telephone from 04/19/2022 in [place] POPULATION HEALTH DEPARTMENT Patient Outreach Telephone from 04/14/2022 in [place] POPULATION HEALTH DEPARTMENT Patient Outreach Telephone from 03/19/2022 in [place] POPULATION HEALTH DEPARTMENT Patient Outreach Telephone from 02/17/2022 in [place] POPULATION HEALTH DEPARTMENT  SDOH Interventions        Food Insecurity Interventions -- -- Intervention Not Indicated -- -- Intervention Not Indicated  Housing Interventions -- Intervention Not Indicated -- -- -- Intervention Not Indicated  Transportation Interventions Payor Benefit  [assisted with arranging transportation] Payor Benefit  [Assisted with arranging transportation to upcoming appointments] -- Payor Benefit  [facility] assisted with arranging transportation with UHC] Payor Benefit  [assisted  with arranging transportation to upcoming appointments] Payor Benefit  [Assisted with arranging transportation]  Utilities Interventions -- -- Intervention Not Indicated -- -- Other (Comment)  [Collaborated with BSW for utility assistance]       Patient interviewed and appropriate assessments performed Discussed plans with patient for ongoing care management follow up and provided patient with direct contact information for care management team-   COPD: (Status: Goal on Track (progressing): YES.) Long Term Goal  Reviewed medications with patient, assisted with requesting needed refills Advised patient to self assesses COPD action plan zone and make appointment with provider if in the yellow zone for 48 hours without improvement Provided education about and advised patient to utilize infection prevention strategies to reduce risk of respiratory infection Discussed the importance of adequate rest and management of fatigue with COPD Advised patient to avoid going out in the cold, cover mouth with a scarf when going out Infection prevention reviewed Encouraged patient to continue to avoid drinking alcohol and avoid smoke Discussed triggers and avoiding being out in the heat during the hottest part of the day   Patient Goals/Self-Care Activities: Take medications as prescribed   Attend all scheduled provider appointments Call pharmacy  for medication refills 3-7 days in advance of running out of medications Perform all self care activities independently  Call provider office for new concerns or questions  - avoid second hand smoke - limit outdoor activity during cold weather - keep follow-up appointments: 05/13/22 with Cardiology - eat healthy/prescribed diet: DASH - get at least 7 to 8 hours of sleep at night       Follow Up:  Patient agrees to Care Plan and Follow-up.  Plan: The Managed Medicaid care management team will reach out to the patient again over the next 30  days.  Date/time of next scheduled [name] care management/care coordination outreach:  07/30/22 @ 10:30 am  [name] [name], BSN [place]  Managed [facility] [name] Care Coordinator [phone]

## 2022-06-30 NOTE — Patient Instructions (Signed)
Visit Information  James [name] was given information about Medicaid Managed Care team care coordination services as a part of their Healthy [facility] Medicaid benefit. James [name] verbally consented to engagement with the [facility] Managed Care team.   If you are experiencing a medical emergency, please call 911 or report to your local emergency department or urgent care.   If you have a non-emergency medical problem during routine business hours, please contact your provider's office and ask to speak with a nurse.   For questions related to your Healthy [facility] health plan, please call: [phone] or visit the homepage here: [email or web address]  If you would like to schedule transportation through your Healthy [facility] plan, please call the following number at least 2 days in advance of your appointment: [phone]  For information about your ride after you set it up, call Ride Assist at [phone]. Use this number to activate a Will Call pickup, or if your transportation is late for a scheduled pickup. Use this number, too, if you need to make a change or cancel a previously scheduled reservation.  If you need transportation services right away, call [phone]. The after-hours call center is staffed 24 hours to handle ride assistance and urgent reservation requests (including discharges) 365 days a year. Urgent trips include sick visits, hospital discharge requests and life-sustaining treatment.  Call the [facility] Line at [phone], at any time, 24 hours a day, 7 days a week. If you are in danger or need immediate medical attention call 911.  If you would like help to quit smoking, call 1-800-QUIT-NOW (418-022-4921) OR Espaol: 1-855-Djelo-Ya (2-831-517-6160) o para ms informacin haga clic aqu or Text READY to 737-106 to register via text  James [name],   Please see education materials related to COPD provided as print  materials.   The patient verbalized understanding of instructions, educational materials, and care plan provided today and agreed to receive a mailed copy of patient instructions, educational materials, and care plan.   Telephone follow up appointment with Managed Medicaid care management team member scheduled for:07/30/22 @ 10:30 am  James Emms RN, BSN [place]  Managed [facility] RN Care Coordinator [phone]   Following is a copy of your plan of care:  Care Plan : RN Care Manager Plan of Care  Updates made by [name], RN since 06/30/2022 12:00 AM     Problem: Chronic Disease Management needs in patient with COPD   Priority: High     Long-Range Goal: Plan of care established for for Chronic Disease management for patient with COPD   Start Date: 11/13/2020  Expected End Date: 09/03/2022  Priority: High  Note:   Current Barriers:  Care Coordination needs related to Limited access to food  Chronic Disease Management support and education needs related to COPD James [name] was unable to pick up medication refills yesterday due to an IT issue at the pharmacy. He needs transportation arranged to PCP visit in July.  RNCM Clinical Goal(s):  Patient will verbalize understanding of plan for management of COPD as evidenced by taking all medications as prescribed, attending all scheduled appointments and contacting provider with questions and concerns take all medications exactly as prescribed and will call provider for medication related questions as evidenced by refilling medications and taking as prescribed    attend all scheduled medical appointments: 05/13/22 with Cardiology and 06/09/22 with PCP as evidenced by provider notes in EMR        demonstrate improved adherence to prescribed  treatment plan for COPD as evidenced by taking all medications as prescribed, attending all scheduled appointments and contacting provider with questions and concerns continue to work with RN Care  Manager and/or Social Worker to address care management and care coordination needs related to COPD as evidenced by adherence to CM Team Scheduled appointments     through collaboration with RN Care manager, provider, and care team.   Interventions: Inter-disciplinary care team collaboration (see longitudinal plan of care) Evaluation of current treatment plan related to  self management and patient's adherence to plan as established by provider Reviewed and assisted with transportation arrangements to upcoming appointment,  07/13/22 pick up time 8:45 am going to [facility], will call for return ride home [phone], 07/14/22 pick up time 8:30 am going to Dr. Lynelle Doctor office, will call for return ride home, reference [phone] Reviewed medications, contacted pharmacy to request delivery of needed medications Discussed dosing changes made on 06/29/22 in detail for furosemide and prednisone   SDOH Barriers (Status:  Goal on track:  Yes. Patient interviewed and SDOH assessment performed        SDOH Interventions    Flowsheet Row Patient Outreach Telephone from 06/30/2022 in [place] POPULATION HEALTH DEPARTMENT Patient Outreach Telephone from 05/25/2022 in [place] POPULATION HEALTH DEPARTMENT Patient Outreach Telephone from 04/19/2022 in [place] POPULATION HEALTH DEPARTMENT Patient Outreach Telephone from 04/14/2022 in [place] POPULATION HEALTH DEPARTMENT Patient Outreach Telephone from 03/19/2022 in [place] POPULATION HEALTH DEPARTMENT Patient Outreach Telephone from 02/17/2022 in [place] POPULATION HEALTH DEPARTMENT  SDOH Interventions        Food Insecurity Interventions -- -- Intervention Not Indicated -- -- Intervention Not Indicated  Housing Interventions -- Intervention Not Indicated -- -- -- Intervention Not Indicated  Transportation Interventions Payor Benefit  [assisted with arranging transportation] Payor Benefit  [Assisted with arranging transportation to  upcoming appointments] -- Payor Benefit  [facility] assisted with arranging transportation with UHC] Payor Benefit  [assisted with arranging transportation to upcoming appointments] Payor Benefit  [Assisted with arranging transportation]  Utilities Interventions -- -- Intervention Not Indicated -- -- Other (Comment)  [Collaborated with BSW for utility assistance]       Patient interviewed and appropriate assessments performed Discussed plans with patient for ongoing care management follow up and provided patient with direct contact information for care management team-   COPD: (Status: Goal on Track (progressing): YES.) Long Term Goal  Reviewed medications with patient, assisted with requesting needed refills Advised patient to self assesses COPD action plan zone and make appointment with provider if in the yellow zone for 48 hours without improvement Provided education about and advised patient to utilize infection prevention strategies to reduce risk of respiratory infection Discussed the importance of adequate rest and management of fatigue with COPD Advised patient to avoid going out in the cold, cover mouth with a scarf when going out Infection prevention reviewed Encouraged patient to continue to avoid drinking alcohol and avoid smoke Discussed triggers and avoiding being out in the heat during the hottest part of the day   Patient Goals/Self-Care Activities: Take medications as prescribed   Attend all scheduled provider appointments Call pharmacy for medication refills 3-7 days in advance of running out of medications Perform all self care activities independently  Call provider office for new concerns or questions  - avoid second hand smoke - limit outdoor activity during cold weather - keep follow-up appointments: 05/13/22 with Cardiology - eat healthy/prescribed diet: DASH - get at  least 7 to 8 hours of sleep at night

## 2022-06-30 NOTE — Progress Notes (Signed)
Let pt know all labs stable no medication changes

## 2022-07-01 ENCOUNTER — Telehealth: Payer: Self-pay

## 2022-07-01 ENCOUNTER — Other Ambulatory Visit (HOSPITAL_COMMUNITY): Payer: Self-pay

## 2022-07-01 NOTE — Telephone Encounter (Signed)
-----   Message from [name], MD sent at 06/30/2022  5:44 AM EDT ----- Let pt know all labs stable no medication changes

## 2022-07-01 NOTE — Telephone Encounter (Signed)
Pt was called and no vm was left due to mailbox being full. Information has been sent to nurse pool.  

## 2022-07-05 DIAGNOSIS — J9601 Acute respiratory failure with hypoxia: Secondary | ICD-10-CM | POA: Diagnosis not present

## 2022-07-05 DIAGNOSIS — F79 Unspecified intellectual disabilities: Secondary | ICD-10-CM | POA: Diagnosis not present

## 2022-07-05 DIAGNOSIS — J9621 Acute and chronic respiratory failure with hypoxia: Secondary | ICD-10-CM | POA: Diagnosis not present

## 2022-07-06 ENCOUNTER — Telehealth (HOSPITAL_COMMUNITY): Payer: Self-pay

## 2022-07-07 DIAGNOSIS — F79 Unspecified intellectual disabilities: Secondary | ICD-10-CM | POA: Diagnosis not present

## 2022-07-07 DIAGNOSIS — J9621 Acute and chronic respiratory failure with hypoxia: Secondary | ICD-10-CM | POA: Diagnosis not present

## 2022-07-07 DIAGNOSIS — J9601 Acute respiratory failure with hypoxia: Secondary | ICD-10-CM | POA: Diagnosis not present

## 2022-07-07 NOTE — Telephone Encounter (Signed)
Called pt to f/u from his recent PCP visit. At our last visit he had a lot of leg swelling and his lasix was increased until he was seen at doc office.  He did not answer the phone and unable to LVM.    Kerry Hough, EMT-Paramedic  [phone] 07/06/22

## 2022-07-12 ENCOUNTER — Inpatient Hospital Stay (HOSPITAL_COMMUNITY): Payer: Medicaid Other

## 2022-07-12 ENCOUNTER — Inpatient Hospital Stay (HOSPITAL_COMMUNITY)
Admission: EM | Admit: 2022-07-12 | Discharge: 2022-07-16 | DRG: 190 | Disposition: A | Discharge status: Home / Self Care | Payer: Medicaid Other | Attending: Internal Medicine | Admitting: Internal Medicine

## 2022-07-12 ENCOUNTER — Encounter (HOSPITAL_COMMUNITY): Payer: Self-pay | Admitting: Internal Medicine

## 2022-07-12 ENCOUNTER — Emergency Department (HOSPITAL_COMMUNITY): Payer: Medicaid Other

## 2022-07-12 ENCOUNTER — Other Ambulatory Visit: Payer: Self-pay

## 2022-07-12 DIAGNOSIS — Z7984 Long term (current) use of oral hypoglycemic drugs: Secondary | ICD-10-CM

## 2022-07-12 DIAGNOSIS — Z720 Tobacco use: Secondary | ICD-10-CM

## 2022-07-12 DIAGNOSIS — Z8249 Family history of ischemic heart disease and other diseases of the circulatory system: Secondary | ICD-10-CM

## 2022-07-12 DIAGNOSIS — F1090 Alcohol use, unspecified, uncomplicated: Secondary | ICD-10-CM

## 2022-07-12 DIAGNOSIS — Z1152 Encounter for screening for COVID-19: Secondary | ICD-10-CM

## 2022-07-12 DIAGNOSIS — Z9981 Dependence on supplemental oxygen: Secondary | ICD-10-CM

## 2022-07-12 DIAGNOSIS — I252 Old myocardial infarction: Secondary | ICD-10-CM

## 2022-07-12 DIAGNOSIS — J441 Chronic obstructive pulmonary disease with (acute) exacerbation: Secondary | ICD-10-CM | POA: Diagnosis not present

## 2022-07-12 DIAGNOSIS — J9621 Acute and chronic respiratory failure with hypoxia: Secondary | ICD-10-CM | POA: Diagnosis not present

## 2022-07-12 DIAGNOSIS — Z902 Acquired absence of lung [part of]: Secondary | ICD-10-CM | POA: Diagnosis not present

## 2022-07-12 DIAGNOSIS — F1010 Alcohol abuse, uncomplicated: Secondary | ICD-10-CM | POA: Diagnosis present

## 2022-07-12 DIAGNOSIS — Z79899 Other long term (current) drug therapy: Secondary | ICD-10-CM | POA: Diagnosis not present

## 2022-07-12 DIAGNOSIS — J189 Pneumonia, unspecified organism: Secondary | ICD-10-CM | POA: Diagnosis not present

## 2022-07-12 DIAGNOSIS — Z825 Family history of asthma and other chronic lower respiratory diseases: Secondary | ICD-10-CM

## 2022-07-12 DIAGNOSIS — R140 Abdominal distension (gaseous): Secondary | ICD-10-CM

## 2022-07-12 DIAGNOSIS — S2242XA Multiple fractures of ribs, left side, initial encounter for closed fracture: Secondary | ICD-10-CM | POA: Diagnosis not present

## 2022-07-12 DIAGNOSIS — E119 Type 2 diabetes mellitus without complications: Secondary | ICD-10-CM

## 2022-07-12 DIAGNOSIS — Z8701 Personal history of pneumonia (recurrent): Secondary | ICD-10-CM

## 2022-07-12 DIAGNOSIS — R0689 Other abnormalities of breathing: Secondary | ICD-10-CM | POA: Diagnosis not present

## 2022-07-12 DIAGNOSIS — Z789 Other specified health status: Secondary | ICD-10-CM

## 2022-07-12 DIAGNOSIS — Z7951 Long term (current) use of inhaled steroids: Secondary | ICD-10-CM | POA: Diagnosis not present

## 2022-07-12 DIAGNOSIS — I10 Essential (primary) hypertension: Secondary | ICD-10-CM | POA: Diagnosis not present

## 2022-07-12 DIAGNOSIS — J9622 Acute and chronic respiratory failure with hypercapnia: Secondary | ICD-10-CM | POA: Diagnosis present

## 2022-07-12 DIAGNOSIS — Z8616 Personal history of COVID-19: Secondary | ICD-10-CM

## 2022-07-12 DIAGNOSIS — N179 Acute kidney failure, unspecified: Secondary | ICD-10-CM

## 2022-07-12 DIAGNOSIS — I491 Atrial premature depolarization: Secondary | ICD-10-CM | POA: Diagnosis not present

## 2022-07-12 DIAGNOSIS — R0902 Hypoxemia: Secondary | ICD-10-CM | POA: Diagnosis not present

## 2022-07-12 DIAGNOSIS — R9431 Abnormal electrocardiogram [ECG] [EKG]: Secondary | ICD-10-CM | POA: Diagnosis not present

## 2022-07-12 DIAGNOSIS — R188 Other ascites: Secondary | ICD-10-CM | POA: Diagnosis present

## 2022-07-12 DIAGNOSIS — R0602 Shortness of breath: Secondary | ICD-10-CM | POA: Diagnosis not present

## 2022-07-12 DIAGNOSIS — R918 Other nonspecific abnormal finding of lung field: Secondary | ICD-10-CM | POA: Diagnosis not present

## 2022-07-12 HISTORY — DX: Acute kidney failure, unspecified: N17.9

## 2022-07-12 LAB — BASIC METABOLIC PANEL
Anion gap: 12 (ref 5–15)
Anion gap: 11 (ref 5–15)
BUN: 33 mg/dL — ABNORMAL HIGH (ref 8–23)
BUN: 33 mg/dL — ABNORMAL HIGH (ref 8–23)
CO2: 26 mmol/L (ref 22–32)
CO2: 26 mmol/L (ref 22–32)
Calcium: 8.9 mg/dL (ref 8.9–10.3)
Calcium: 8.6 mg/dL — ABNORMAL LOW (ref 8.9–10.3)
Chloride: 98 mmol/L (ref 98–111)
Chloride: 98 mmol/L (ref 98–111)
Creatinine, Ser: 1.68 mg/dL — ABNORMAL HIGH (ref 0.61–1.24)
Creatinine, Ser: 1.46 mg/dL — ABNORMAL HIGH (ref 0.61–1.24)
GFR, Estimated: 46 mL/min — ABNORMAL LOW (ref >60)
GFR, Estimated: 54 mL/min — ABNORMAL LOW (ref >60)
Glucose, Bld: 129 mg/dL — ABNORMAL HIGH (ref 70–99)
Glucose, Bld: 205 mg/dL — ABNORMAL HIGH (ref 70–99)
Potassium: 4.5 mmol/L (ref 3.5–5.1)
Potassium: 4.6 mmol/L (ref 3.5–5.1)
Sodium: 136 mmol/L (ref 135–145)
Sodium: 135 mmol/L (ref 135–145)

## 2022-07-12 LAB — CBG MONITORING, ED
Glucose-Capillary: 199 mg/dL — ABNORMAL HIGH (ref 70–99)
Glucose-Capillary: 243 mg/dL — ABNORMAL HIGH (ref 70–99)
Glucose-Capillary: 226 mg/dL — ABNORMAL HIGH (ref 70–99)
Glucose-Capillary: 260 mg/dL — ABNORMAL HIGH (ref 70–99)
Glucose-Capillary: 268 mg/dL — ABNORMAL HIGH (ref 70–99)
Glucose-Capillary: 227 mg/dL — ABNORMAL HIGH (ref 70–99)

## 2022-07-12 LAB — I-STAT ARTERIAL BLOOD GAS, ED
Acid-base deficit: 1 mmol/L (ref 0.0–2.0)
Bicarbonate: 26.1 mmol/L (ref 20.0–28.0)
Calcium, Ion: 1.21 mmol/L (ref 1.15–1.40)
HCT: 36 % — ABNORMAL LOW (ref 39.0–52.0)
Hemoglobin: 12.2 g/dL — ABNORMAL LOW (ref 13.0–17.0)
O2 Saturation: 94 %
Patient temperature: 98.6
Potassium: 4.4 mmol/L (ref 3.5–5.1)
Sodium: 135 mmol/L (ref 135–145)
TCO2: 28 mmol/L (ref 22–32)
pCO2 arterial: 52.1 mmHg — ABNORMAL HIGH (ref 32–48)
pH, Arterial: 7.308 — ABNORMAL LOW (ref 7.35–7.45)
pO2, Arterial: 77 mmHg — ABNORMAL LOW (ref 83–108)

## 2022-07-12 LAB — CBC WITH DIFFERENTIAL/PLATELET
Abs Immature Granulocytes: 0.11 10*3/uL — ABNORMAL HIGH (ref 0.00–0.07)
Basophils Absolute: 0.1 10*3/uL (ref 0.0–0.1)
Basophils Relative: 0 %
Eosinophils Absolute: 0.1 10*3/uL (ref 0.0–0.5)
Eosinophils Relative: 1 %
HCT: 37.2 % — ABNORMAL LOW (ref 39.0–52.0)
Hemoglobin: 11.4 g/dL — ABNORMAL LOW (ref 13.0–17.0)
Immature Granulocytes: 1 %
Lymphocytes Relative: 18 %
Lymphs Abs: 2.1 10*3/uL (ref 0.7–4.0)
MCH: 29.8 pg (ref 26.0–34.0)
MCHC: 30.6 g/dL (ref 30.0–36.0)
MCV: 97.1 fL (ref 80.0–100.0)
Monocytes Absolute: 1 10*3/uL (ref 0.1–1.0)
Monocytes Relative: 9 %
Neutro Abs: 8.4 10*3/uL — ABNORMAL HIGH (ref 1.7–7.7)
Neutrophils Relative %: 71 %
Platelets: 328 10*3/uL (ref 150–400)
RBC: 3.83 MIL/uL — ABNORMAL LOW (ref 4.22–5.81)
RDW: 14.2 % (ref 11.5–15.5)
WBC: 11.8 10*3/uL — ABNORMAL HIGH (ref 4.0–10.5)
nRBC: 0 % (ref 0.0–0.2)

## 2022-07-12 LAB — TROPONIN I (HIGH SENSITIVITY)
Troponin I (High Sensitivity): 17 ng/L (ref <18)
Troponin I (High Sensitivity): 20 ng/L — ABNORMAL HIGH (ref <18)

## 2022-07-12 LAB — RESP PANEL BY RT-PCR (RSV, FLU A&B, COVID)  RVPGX2
Influenza A by PCR: NEGATIVE
Influenza B by PCR: NEGATIVE
Resp Syncytial Virus by PCR: NEGATIVE
SARS Coronavirus 2 by RT PCR: NEGATIVE

## 2022-07-12 LAB — PROCALCITONIN: Procalcitonin: <0.1 ng/mL

## 2022-07-12 LAB — BRAIN NATRIURETIC PEPTIDE: B Natriuretic Peptide: 54.6 pg/mL (ref 0.0–100.0)

## 2022-07-12 MED ORDER — VANCOMYCIN HCL IN DEXTROSE 1-5 GM/200ML-% IV SOLN: 1000.0000 mg | Freq: Once | INTRAVENOUS | Status: DC

## 2022-07-12 MED ORDER — ACETAMINOPHEN 650 MG RE SUPP: 650.0000 mg | Freq: Four times a day (QID) | RECTAL | Status: DC | PRN

## 2022-07-12 MED ORDER — ACETAMINOPHEN 325 MG PO TABS
650.0000 mg | ORAL_TABLET | Freq: Four times a day (QID) | ORAL | Status: DC | PRN
  Administered 2022-07-13: 650 mg via ORAL
  Filled 2022-07-12: qty 2

## 2022-07-12 MED ORDER — ENOXAPARIN SODIUM 40 MG/0.4ML IJ SOSY
40.0000 mg | PREFILLED_SYRINGE | INTRAMUSCULAR | Status: DC
  Administered 2022-07-12 – 2022-07-15 (×4): 40 mg via SUBCUTANEOUS
  Filled 2022-07-12 (×4): qty 0.4

## 2022-07-12 MED ORDER — ALBUTEROL SULFATE (2.5 MG/3ML) 0.083% IN NEBU: 2.5000 mg | INHALATION_SOLUTION | RESPIRATORY_TRACT | Status: DC | PRN

## 2022-07-12 MED ORDER — IPRATROPIUM-ALBUTEROL 0.5-2.5 (3) MG/3ML IN SOLN: 3.0000 mL | Freq: Three times a day (TID) | RESPIRATORY_TRACT | Status: DC

## 2022-07-12 MED ORDER — SODIUM CHLORIDE 0.9 % IV SOLN: 2.0000 g | Freq: Two times a day (BID) | INTRAVENOUS | Status: DC

## 2022-07-12 MED ORDER — IPRATROPIUM-ALBUTEROL 0.5-2.5 (3) MG/3ML IN SOLN
3.0000 mL | Freq: Four times a day (QID) | RESPIRATORY_TRACT | Status: DC
  Administered 2022-07-12 – 2022-07-13 (×4): 3 mL via RESPIRATORY_TRACT
  Filled 2022-07-12 (×4): qty 3

## 2022-07-12 MED ORDER — SODIUM CHLORIDE 0.9 % IV SOLN
2.0000 g | Freq: Three times a day (TID) | INTRAVENOUS | Status: DC
  Administered 2022-07-12 – 2022-07-13 (×3): 2 g via INTRAVENOUS
  Filled 2022-07-12 (×3): qty 12.5

## 2022-07-12 MED ORDER — VANCOMYCIN HCL 2000 MG/400ML IV SOLN
2000.0000 mg | Freq: Once | INTRAVENOUS | Status: AC
  Administered 2022-07-12: 2000 mg via INTRAVENOUS
  Filled 2022-07-12: qty 400

## 2022-07-12 MED ORDER — ALBUTEROL SULFATE (2.5 MG/3ML) 0.083% IN NEBU
INHALATION_SOLUTION | RESPIRATORY_TRACT | Status: AC
  Filled 2022-07-12: qty 9

## 2022-07-12 MED ORDER — ALBUTEROL SULFATE (2.5 MG/3ML) 0.083% IN NEBU
10.0000 mg/h | INHALATION_SOLUTION | Freq: Once | RESPIRATORY_TRACT | Status: AC
  Administered 2022-07-12: 10 mg/h via RESPIRATORY_TRACT
  Filled 2022-07-12: qty 3

## 2022-07-12 MED ORDER — VANCOMYCIN HCL IN DEXTROSE 1-5 GM/200ML-% IV SOLN: 1000.0000 mg | INTRAVENOUS | Status: DC

## 2022-07-12 MED ORDER — SODIUM CHLORIDE 0.9 % IV SOLN
2.0000 g | Freq: Once | INTRAVENOUS | Status: AC
  Administered 2022-07-12: 2 g via INTRAVENOUS
  Filled 2022-07-12: qty 12.5

## 2022-07-12 MED ORDER — VANCOMYCIN VARIABLE DOSE PER UNSTABLE RENAL FUNCTION (PHARMACIST DOSING): Status: DC

## 2022-07-12 MED ORDER — METHYLPREDNISOLONE SODIUM SUCC 125 MG IJ SOLR
120.0000 mg | Freq: Every day | INTRAMUSCULAR | Status: DC
  Administered 2022-07-12 – 2022-07-16 (×5): 120 mg via INTRAVENOUS
  Filled 2022-07-12 (×5): qty 2

## 2022-07-12 MED ORDER — VANCOMYCIN HCL 2000 MG/400ML IV SOLN
2000.0000 mg | INTRAVENOUS | Status: DC
  Administered 2022-07-13: 2000 mg via INTRAVENOUS
  Filled 2022-07-12: qty 400

## 2022-07-12 MED ORDER — INSULIN ASPART 100 UNIT/ML IJ SOLN
0.0000 [IU] | INTRAMUSCULAR | Status: DC
  Administered 2022-07-12: 3 [IU] via SUBCUTANEOUS
  Administered 2022-07-12: 2 [IU] via SUBCUTANEOUS
  Administered 2022-07-12 (×2): 3 [IU] via SUBCUTANEOUS
  Administered 2022-07-12: 5 [IU] via SUBCUTANEOUS
  Administered 2022-07-13: 1 [IU] via SUBCUTANEOUS
  Administered 2022-07-13: 2 [IU] via SUBCUTANEOUS

## 2022-07-12 NOTE — Progress Notes (Signed)
Pharmacy Antibiotic Note  James [name] is a 62 y.o. male admitted on 07/12/2022 with pneumonia.  Pharmacy has been consulted for vancomycin and cefepime dosing. sCr 1.68  up from 1.0 last month. Will enter standing vancomycin doses, but will need to adjust as renal function changes.  Plan: Cefepime 2G IV q12 hours Vancomcyin 2G IV x1 then 1G IV q24 hours (eAUC 477)  Height: 5\' 9"  (175.3 cm) Weight: 98.4 kg (216 lb 14.9 oz) IBW/kg (Calculated) : 70.7  Temp (24hrs), Avg:98.6 F (37 C), Min:98.6 F (37 C), Max:98.6 F (37 C)  Recent Labs  Lab 07/12/22 0340  WBC 11.8*  CREATININE 1.68*    Estimated Creatinine Clearance: 53.4 mL/min (A) (by C-G formula based on SCr of 1.68 mg/dL (H)).    No Known Allergies   Thank you for allowing pharmacy to be a part of this patient's care.  Toniann Fail Zackary Mckeone 07/12/2022 6:06 AM

## 2022-07-12 NOTE — Progress Notes (Signed)
RT took patient off bipap and placed on 3L [place]. Patient tolerating well at this time. RN aware. RT will continue to monitor as needed.

## 2022-07-12 NOTE — ED Provider Notes (Signed)
[place] EMERGENCY DEPARTMENT AT [facility] Provider Note   CSN: [id number] Arrival date & time: 07/12/22  3086     History  Chief Complaint  Patient presents with   Shortness of Breath    James [name] is a 62 y.o. male.  The history is provided by the EMS personnel. The history is limited by the condition of the patient.  Shortness of Breath Severity:  Severe Onset quality:  Sudden Timing:  Constant Progression:  Unchanged Chronicity:  Recurrent Context: not URI   Relieved by:  Nothing Worsened by:  Nothing Ineffective treatments:  None tried Associated symptoms: wheezing   Associated symptoms: no chest pain and no fever   Risk factors: no recent surgery   Patient with a h/o CPA and HCAP and respiratory failure and COPD who     Past Medical History:  Diagnosis Date   Acute on chronic respiratory failure with hypoxia (HCC) 06/08/2013   Acute on chronic respiratory failure with hypoxia and hypercapnia (HCC) 06/08/2013   Alcohol use disorder 11/23/2018   CAP (community acquired pneumonia) 05/17/2018   See admit 05/17/18 ? rml  with   covid pcr neg - rx augmentin > f/u cxr in 4-6 weeks is fine unless condition declines    Community acquired pneumonia 07/12/2021   Community acquired pneumonia of right lower lobe of lung 05/17/2018   See admit 05/17/18 ? 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 Authorizing Provider  albuterol (PROVENTIL) (2.5 MG/3ML) 0.083% nebulizer solution Take 3 mLs (2.5 mg total) by nebulization every 4 (four) hours as needed for wheezing or shortness of breath. 03/02/22   [name], MD  albuterol (VENTOLIN HFA) 108 (90 Base) MCG/ACT inhaler Inhale 2 puffs into the lungs every 6 (six) hours as needed for wheezing or shortness of breath. 03/02/22   [name], MD  atorvastatin (LIPITOR) 10 MG tablet Take 1 tablet (10 mg total) by mouth daily. Patient not taking: Reported on 06/30/2022 03/02/22   [name], MD  Budeson-Glycopyrrol-Formoterol (BREZTRI AEROSPHERE) 160-9-4.8 MCG/ACT AERO Inhale 2 puffs into the lungs in the morning and at bedtime. 10/14/21   Omar Person, MD  Continuous Glucose Receiver (FREESTYLE LIBRE 2 READER) DEVI Use to check blood sugar continuously throughout the day. 04/14/22   [name], MD  Continuous Glucose Sensor (FREESTYLE LIBRE 2 SENSOR) MISC Use to check blood sugar continuously throughout the day. 04/14/22   [name], MD  furosemide (LASIX) 40 MG tablet Take 2 tablets (80 mg total) by mouth daily for 4 days, THEN 1 tablet (40 mg total) daily. 06/29/22 09/26/22  [name], MD  glipiZIDE (GLUCOTROL XL) 5 MG 24 hr tablet Take 1 tablet (5 mg total) by mouth daily with breakfast. 06/09/22   [name], MD  losartan (COZAAR) 100 MG tablet Take 1 tablet (100 mg total) by mouth daily. 06/09/22   [name], MD  metoprolol succinate (TOPROL-XL) 50 MG 24 hr tablet Take 1 tablet (50 mg total) by mouth daily. Take with or immediately following a meal. 06/09/22   [name], MD  nystatin cream (MYCOSTATIN) Apply to affected areas 3 (three) times daily. Patient not taking: Reported on 06/30/2022 06/09/22   [name], MD  pantoprazole (PROTONIX) 40 MG tablet Take 1 tablet (40 mg total) by mouth daily. 06/09/22   [name], MD  predniSONE (DELTASONE) 10 MG tablet Take 4 tablets (40 mg total) by mouth daily for 5 days, THEN 1 tablet (10 mg total) daily. 06/30/22 09/12/22  [name], MD  pregabalin (LYRICA) 100 MG capsule Take 1 capsule (100 mg total) by mouth 2 (two) times daily. 06/09/22   [name], MD      Allergies    Patient has no known allergies.    Review of Systems   Review of Systems  Unable to perform ROS: Acuity of condition  Constitutional:  Negative for fever.  Respiratory:  Positive for shortness of breath and wheezing.   Cardiovascular:  Negative for chest pain.    Physical Exam Updated Vital Signs BP 120/68   Pulse (!) 103   Temp 98.6 F (37 C) (Tympanic)   Resp 19   Ht 5\' 9"  (1.753 m)   Wt 98.4 kg   SpO2 100%   BMI 32.04 kg/m  Physical Exam Vitals and nursing note reviewed. Exam conducted with a chaperone present.  Constitutional:      General: He is not in acute distress.    Appearance: Normal appearance. He is well-developed. He is not diaphoretic.  HENT:     Head: Normocephalic and atraumatic.     Nose: Nose normal.  Eyes:     Conjunctiva/sclera: Conjunctivae normal.     Pupils: Pupils are equal, round, and reactive to light.  Cardiovascular:     Rate and Rhythm: Normal rate and regular rhythm.  Pulmonary:     Effort: Tachypnea present.     Breath sounds: Decreased air movement present. Wheezing present. No rales.  Abdominal:     General: Bowel sounds are normal.     Palpations: Abdomen is soft.     Tenderness: There is no abdominal tenderness. There is no guarding or rebound.  Musculoskeletal:        General: Normal range of motion.     Cervical back: Normal range of motion and neck supple.  Skin:    General: Skin is warm and dry.     Capillary Refill: Capillary refill takes less than 2 seconds.  Neurological:     Mental Status: He is alert.     Deep Tendon Reflexes: Reflexes normal.     ED Results / Procedures / Treatments   Labs (all labs  ordered are listed, but only abnormal results are displayed) Results for orders placed or performed during the hospital encounter of 07/12/22  Resp panel by RT-PCR (RSV, Flu A&B, Covid) Anterior Nasal Swab   Specimen: Anterior Nasal Swab  Result Value Ref Range   SARS Coronavirus 2 by RT PCR NEGATIVE NEGATIVE   Influenza A by PCR NEGATIVE NEGATIVE   Influenza B by PCR NEGATIVE NEGATIVE   Resp Syncytial Virus by PCR NEGATIVE NEGATIVE  CBC with Differential  Result Value Ref Range   WBC 11.8 (H) 4.0 - 10.5 K/uL  RBC 3.83 (L) 4.22 - 5.81 MIL/uL   Hemoglobin 11.4 (L) 13.0 - 17.0 g/dL   HCT 78.2 (L) 95.6 - 21.3 %   MCV 97.1 80.0 - 100.0 fL   MCH 29.8 26.0 - 34.0 pg   MCHC 30.6 30.0 - 36.0 g/dL   RDW 08.6 57.8 - 46.9 %   Platelets 328 150 - 400 K/uL   nRBC 0.0 0.0 - 0.2 %   Neutrophils Relative % 71 %   Neutro Abs 8.4 (H) 1.7 - 7.7 K/uL   Lymphocytes Relative 18 %   Lymphs Abs 2.1 0.7 - 4.0 K/uL   Monocytes Relative 9 %   Monocytes Absolute 1.0 0.1 - 1.0 K/uL   Eosinophils Relative 1 %   Eosinophils Absolute 0.1 0.0 - 0.5 K/uL   Basophils Relative 0 %   Basophils Absolute 0.1 0.0 - 0.1 K/uL   Immature Granulocytes 1 %   Abs Immature Granulocytes 0.11 (H) 0.00 - 0.07 K/uL  Basic metabolic panel  Result Value Ref Range   Sodium 136 135 - 145 mmol/L   Potassium 4.5 3.5 - 5.1 mmol/L   Chloride 98 98 - 111 mmol/L   CO2 26 22 - 32 mmol/L   Glucose, Bld 129 (H) 70 - 99 mg/dL   BUN 33 (H) 8 - 23 mg/dL   Creatinine, Ser 6.29 (H) 0.61 - 1.24 mg/dL   Calcium 8.9 8.9 - 52.8 mg/dL   GFR, Estimated 46 (L) >60 mL/min   Anion gap 12 5 - 15  I-Stat arterial blood gas, ED ([facility], [facility], [facility])  Result Value Ref Range   pH, Arterial 7.308 (L) 7.35 - 7.45   pCO2 arterial 52.1 (H) 32 - 48 mmHg   pO2, Arterial 77 (L) 83 - 108 mmHg   Bicarbonate 26.1 20.0 - 28.0 mmol/L   TCO2 28 22 - 32 mmol/L   O2 Saturation 94 %   Acid-base deficit 1.0 0.0 - 2.0 mmol/L   Sodium 135 135 - 145 mmol/L    Potassium 4.4 3.5 - 5.1 mmol/L   Calcium, Ion 1.21 1.15 - 1.40 mmol/L   HCT 36.0 (L) 39.0 - 52.0 %   Hemoglobin 12.2 (L) 13.0 - 17.0 g/dL   Patient temperature 41.3 F    Collection site RADIAL, ALLEN'S TEST ACCEPTABLE    Drawn by Operator    Sample type ARTERIAL   Troponin I (High Sensitivity)  Result Value Ref Range   Troponin I (High Sensitivity) 17 <18 ng/L   DG Chest Portable 1 View  Result Date: 07/12/2022 CLINICAL DATA:  Shortness of breath EXAM: PORTABLE CHEST 1 VIEW COMPARISON:  Chest x-ray 06/14/2022.  CT of the chest 02/22/2022. FINDINGS: Multiple healed left-sided rib fractures are again seen. There is blunting of the left costophrenic angle likely related to scarring., unchanged. There is some minimal new patchy opacities in the left lung base. Cardiomediastinal silhouette is within normal limits. IMPRESSION: 1. Minimal new patchy opacities in the left lung base may represent atelectasis or developing infection. 2. Chronic changes in the left lung. Electronically Signed   By: [name] M.D.   On: 07/12/2022 04:08   DG Abd 1 View  Result Date: 06/19/2022 CLINICAL DATA:  Abdominal distension and pain. EXAM: ABDOMEN - 1 VIEW COMPARISON:  Abdominal radiograph dated 11/29/2021. FINDINGS: Evaluation is limited due to body habitus. There is large amount of stool throughout the colon. No bowel dilatation or evidence of obstruction. No free air or radiopaque calculi. The osseous structures are  intact. The soft tissues unremarkable. IMPRESSION: Constipation.  No bowel obstruction. Electronically Signed   By: Elgie Collard M.D.   On: 06/19/2022 19:31   DG Chest Port 1 View  Result Date: 06/14/2022 CLINICAL DATA:  Respiratory distress EXAM: PORTABLE CHEST 1 VIEW COMPARISON:  05/03/2022 FINDINGS: Multiple remote bilateral rib fractures are identified with resultant left chest wall deformity and left-sided volume loss. Stable left pleural thickening. Lungs are clear. No pneumothorax or  pleural effusion. Cardiac size within normal limits. Pulmonary vascularity is normal. No acute bone abnormality. IMPRESSION: 1. No active disease. 2. Multiple remote bilateral rib fractures. Electronically Signed   By: Helyn Numbers M.D.   On: 06/14/2022 01:39     EKG  EKG Interpretation Date/Time:  Monday July 12 2022 03:43:07 EDT Ventricular Rate:  97 PR Interval:  184 QRS Duration:  98 QT Interval:  366 QTC Calculation: 465 R Axis:   66  Text Interpretation: Sinus tachycardia Atrial premature complexes Confirmed by Orvill Coulthard ([id number]) on 07/12/2022 5:25:44 AM        Radiology DG Chest Portable 1 View  Result Date: 07/12/2022 CLINICAL DATA:  Shortness of breath EXAM: PORTABLE CHEST 1 VIEW COMPARISON:  Chest x-ray 06/14/2022.  CT of the chest 02/22/2022. FINDINGS: Multiple healed left-sided rib fractures are again seen. There is blunting of the left costophrenic angle likely related to scarring., unchanged. There is some minimal new patchy opacities in the left lung base. Cardiomediastinal silhouette is within normal limits. IMPRESSION: 1. Minimal new patchy opacities in the left lung base may represent atelectasis or developing infection. 2. Chronic changes in the left lung. Electronically Signed   By: [name] M.D.   On: 07/12/2022 04:08    Procedures Procedures    Medications Ordered in ED Medications  albuterol (PROVENTIL) (2.5 MG/3ML) 0.083% nebulizer solution (  Total Dose 07/12/22 0406)  vancomycin (VANCOREADY) IVPB 2000 mg/400 mL (2,000 mg Intravenous New Bag/Given 07/12/22 0426)  ceFEPIme (MAXIPIME) 2 g in sodium chloride 0.9 % 100 mL IVPB (has no administration in time range)  albuterol (PROVENTIL) (2.5 MG/3ML) 0.083% nebulizer solution (10 mg/hr Nebulization Given 07/12/22 0355)    ED Course/ Medical Decision Making/ A&P                             Medical Decision Making Patient with COPS and h/o aspirating chewing tobacco albuterol solumedrol and magnesium given    Amount and/or Complexity of Data Reviewed Independent Historian: EMS    Details: See above  External Data Reviewed: notes.    Details: Previous ED and hospital notes reviewed  Labs: ordered.    Details: Negative covid and flu white count 11.8 elevated, hemoglobin low 11.4, normal platelet count.  Normal sodium 136, normal potassium 4.5, elevated creatinine 1.68.  slight acidosis 7.308 slight low PO2 77 Radiology: ordered and independent interpretation performed.    Details: PNA by me on CXR ECG/medicine tests: ordered and independent interpretation performed. Decision-making details documented in ED Course.  Risk Prescription drug management. Decision regarding hospitalization. Risk Details: BIPAP initiated with continuous nebs.  Will admit   Critical Care Total time providing critical care: minutes (BIPAP and continuous neb and bed side care )    Final Clinical Impression(s) / ED Diagnoses Final diagnoses:  COPD exacerbation (HCC)  HCAP (healthcare-associated pneumonia)  AKI (acute kidney injury) (HCC)   The patient appears reasonably stabilized for admission considering the current resources, flow, and capabilities available in the  ED at this time, and I doubt any other [facility] requiring further screening and/or treatment in the ED prior to admission.  Rx / DC Orders ED Discharge Orders     None         Armonii Sieh, MD 07/12/22 0530

## 2022-07-12 NOTE — H&P (Signed)
History and Physical    James [name] ZOX:[id number] DOB: 05/02/1960 DOA: 07/12/2022  PCP: [name], MD  Patient coming from: Home  Chief Complaint: Shortness of breath  HPI: James [name] is a 62 y.o. male with medical history significant of COPD on 4 L home oxygen, recurrent pneumonia, alcohol use, type 2 diabetes, hypertension, GERD.  He was admitted a month ago for COPD exacerbation.  Presented to ED with shortness of breath and abdominal distention. Per EMS, he fell asleep with chewing tobacco. Placed on BiPAP due to increased work of breathing. Afebrile. Labs showing WBC 11.8, hemoglobin 11.4 (stable), creatinine 1.6 (baseline 1.0), initial troponin negative and repeat pending, BNP normal, COVID/influenza/RSV PCR negative. ABG showing pH 7.30, pCO2 52.1, and pO2 77. Chest x-ray showing minimal new patchy opacities in the left lung base suspicious for atelectasis versus developing pneumonia. EKG without acute ischemic changes. Patient received albuterol continuous neb treatment, vancomycin, and cefepime in the ED.  History limited as patient is currently on BiPAP.  He reports improvement of his dyspnea and denies chest pain.  He is on continuous 2 L home oxygen.  Denies abdominal pain or constipation.  Review of Systems:  Review of Systems  All other systems reviewed and are negative.   Past Medical History:  Diagnosis Date   Acute on chronic respiratory failure with hypoxia (HCC) 06/08/2013   Acute on chronic respiratory failure with hypoxia and hypercapnia (HCC) 06/08/2013   Alcohol use disorder 11/23/2018   CAP (community acquired pneumonia) 05/17/2018   See admit 05/17/18 ? rml  with   covid pcr neg - rx augmentin > f/u cxr in 4-6 weeks is fine unless condition declines    Community acquired pneumonia 07/12/2021   Community acquired pneumonia of right lower lobe of lung 05/17/2018   See admit 05/17/18 ? 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   Recurrent pneumonia 02/20/2021   Tick bite 06/03/2018    Past Surgical History:  Procedure Laterality Date   LEFT HEART CATH AND CORONARY ANGIOGRAPHY N/A 12/07/2021   Procedure: LEFT HEART CATH AND CORONARY ANGIOGRAPHY;  Surgeon: Runell Gess, MD;  Location: [facility];  Service: Cardiovascular;  Laterality: N/A;   LUNG REMOVAL, PARTIAL     VIDEO ASSISTED THORACOSCOPY (VATS)/THOROCOTOMY Left 06/11/2013   Procedure: VIDEO ASSISTED THORACOSCOPY (VATS)/THOROCOTOMY;  Surgeon: Kerin Perna, MD;  Location: [facility] OR;  Service: Thoracic;  Laterality: Left;   VIDEO BRONCHOSCOPY N/A 06/11/2013   Procedure: VIDEO BRONCHOSCOPY;  Surgeon: Kerin Perna, MD;  Location: [facility] OR;  Service: Thoracic;  Laterality: N/A;   VIDEO BRONCHOSCOPY N/A 08/10/2018   Procedure: VIDEO BRONCHOSCOPY WITH FLUORO;  Surgeon: Lorin Glass, MD;  Location: [facility] ENDOSCOPY;  Service: Endoscopy;  Laterality: N/A;     reports that he quit smoking about 10 years ago. His smoking use included cigarettes. He has a 60.00 pack-year smoking history. He has quit using smokeless tobacco.  His smokeless tobacco use included chew. He  reports that he does not currently use alcohol after a past usage of about 28.0 standard drinks of alcohol per week. He reports that he does not currently use drugs after having used the following drugs: Marijuana.  No Known Allergies  Family History  Problem Relation Age of Onset   Heart disease Father    COPD Sister     Prior to Admission  medications   Medication Sig Start Date End Date Taking? Authorizing Provider  albuterol (PROVENTIL) (2.5 MG/3ML) 0.083% nebulizer solution Take 3 mLs (2.5 mg total) by nebulization every 4 (four) hours as needed for wheezing or shortness of breath. 03/02/22   [name], MD  albuterol (VENTOLIN HFA) 108 (90 Base) MCG/ACT inhaler Inhale 2 puffs into the lungs every 6 (six) hours as needed for wheezing or shortness of breath. 03/02/22   [name], MD  atorvastatin (LIPITOR) 10 MG tablet Take 1 tablet (10 mg total) by mouth daily. Patient not taking: Reported on 06/30/2022 03/02/22   [name], MD  Budeson-Glycopyrrol-Formoterol (BREZTRI AEROSPHERE) 160-9-4.8 MCG/ACT AERO Inhale 2 puffs into the lungs in the morning and at bedtime. 10/14/21   Omar Person, MD  Continuous Glucose Receiver (FREESTYLE LIBRE 2 READER) DEVI Use to check blood sugar continuously throughout the day. 04/14/22   [name], MD  Continuous Glucose Sensor (FREESTYLE LIBRE 2 SENSOR) MISC Use to check blood sugar continuously throughout the day. 04/14/22   [name], MD  furosemide (LASIX) 40 MG tablet Take 2 tablets (80 mg total) by mouth daily for 4 days, THEN 1 tablet (40 mg total) daily. 06/29/22 09/26/22  [name], MD  glipiZIDE (GLUCOTROL XL) 5 MG 24 hr tablet Take 1 tablet (5 mg total) by mouth daily with breakfast. 06/09/22   [name], MD  losartan (COZAAR) 100 MG tablet Take 1 tablet (100 mg total) by mouth daily. 06/09/22   [name], MD  metoprolol succinate (TOPROL-XL) 50 MG 24 hr tablet Take 1 tablet (50 mg total) by mouth daily. Take with or immediately following a meal. 06/09/22   [name], MD  nystatin cream (MYCOSTATIN) Apply to affected areas 3 (three) times daily. Patient not taking: Reported on 06/30/2022 06/09/22   [name], MD  pantoprazole (PROTONIX) 40 MG tablet Take 1 tablet (40 mg total) by mouth daily. 06/09/22   [name], MD   predniSONE (DELTASONE) 10 MG tablet Take 4 tablets (40 mg total) by mouth daily for 5 days, THEN 1 tablet (10 mg total) daily. 06/30/22 09/12/22  [name], MD  pregabalin (LYRICA) 100 MG capsule Take 1 capsule (100 mg total) by mouth 2 (two) times daily. 06/09/22   [name], MD    Physical Exam: Vitals:   07/12/22 0345 07/12/22 0417  BP: 124/72 120/68  Pulse: 94 (!) 103  Resp: 19 19  Temp: 98.6 F (37 C)   TempSrc: Tympanic   SpO2: 98% 100%  Weight: 98.4 kg   Height: 5\' 9"  (1.753 m)     Physical Exam Vitals reviewed.  Constitutional:      General: He is not in acute distress. HENT:     Head: Normocephalic and atraumatic.  Eyes:     Extraocular Movements: Extraocular movements intact.  Cardiovascular:     Rate and Rhythm: Normal rate and regular rhythm.     Pulses: Normal pulses.  Pulmonary:     Effort: Pulmonary effort is normal. No respiratory distress.  Breath sounds: No wheezing or rales.     Comments: Diminished breath sounds bilaterally Abdominal:     General: Bowel sounds are normal. There is distension.     Palpations: Abdomen is soft.     Tenderness: There is no abdominal tenderness. There is no guarding.  Musculoskeletal:     Cervical back: Normal range of motion.     Right lower leg: No edema.     Left lower leg: No edema.  Skin:    General: Skin is warm and dry.  Neurological:     General: No focal deficit present.     Mental Status: He is alert and oriented to person, place, and time.     Labs on Admission: I have personally reviewed following labs and imaging studies  CBC: Recent Labs  Lab 07/12/22 0340 07/12/22 0411  WBC 11.8*  --   NEUTROABS 8.4*  --   HGB 11.4* 12.2*  HCT 37.2* 36.0*  MCV 97.1  --   PLT 328  --    Basic Metabolic Panel: Recent Labs  Lab 07/12/22 0340 07/12/22 0411  NA 136 135  K 4.5 4.4  CL 98  --   CO2 26  --   GLUCOSE 129*  --   BUN 33*  --   CREATININE 1.68*  --   CALCIUM 8.9  --     GFR: Estimated Creatinine Clearance: 53.4 mL/min (A) (by C-G formula based on SCr of 1.68 mg/dL (H)). Liver Function Tests: No results for input(s): "AST", "ALT", "ALKPHOS", "BILITOT", "PROT", "ALBUMIN" in the last 168 hours. No results for input(s): "LIPASE", "AMYLASE" in the last 168 hours. No results for input(s): "AMMONIA" in the last 168 hours. Coagulation Profile: No results for input(s): "INR", "PROTIME" in the last 168 hours. Cardiac Enzymes: No results for input(s): "CKTOTAL", "CKMB", "CKMBINDEX", "TROPONINI" in the last 168 hours. BNP (last 3 results) No results for input(s): "PROBNP" in the last 8760 hours. HbA1C: No results for input(s): "HGBA1C" in the last 72 hours. CBG: No results for input(s): "GLUCAP" in the last 168 hours. Lipid Profile: No results for input(s): "CHOL", "HDL", "LDLCALC", "TRIG", "CHOLHDL", "LDLDIRECT" in the last 72 hours. Thyroid Function Tests: No results for input(s): "TSH", "T4TOTAL", "FREET4", "T3FREE", "THYROIDAB" in the last 72 hours. Anemia Panel: No results for input(s): "VITAMINB12", "FOLATE", "FERRITIN", "TIBC", "IRON", "RETICCTPCT" in the last 72 hours. Urine analysis:    Component Value Date/Time   COLORURINE YELLOW 07/12/2021 0739   APPEARANCEUR CLEAR 07/12/2021 0739   LABSPEC 1.026 07/12/2021 0739   PHURINE 5.0 07/12/2021 0739   GLUCOSEU 150 (A) 07/12/2021 0739   HGBUR NEGATIVE 07/12/2021 0739   BILIRUBINUR NEGATIVE 07/12/2021 0739   BILIRUBINUR negative 09/25/2018 1432   KETONESUR NEGATIVE 07/12/2021 0739   PROTEINUR NEGATIVE 07/12/2021 0739   UROBILINOGEN 0.2 02/19/2019 1029   NITRITE NEGATIVE 07/12/2021 0739   LEUKOCYTESUR NEGATIVE 07/12/2021 0739    Radiological Exams on Admission: DG Chest Portable 1 View  Result Date: 07/12/2022 CLINICAL DATA:  Shortness of breath EXAM: PORTABLE CHEST 1 VIEW COMPARISON:  Chest x-ray 06/14/2022.  CT of the chest 02/22/2022. FINDINGS: Multiple healed left-sided rib fractures are again  seen. There is blunting of the left costophrenic angle likely related to scarring., unchanged. There is some minimal new patchy opacities in the left lung base. Cardiomediastinal silhouette is within normal limits. IMPRESSION: 1. Minimal new patchy opacities in the left lung base may represent atelectasis or developing infection. 2. Chronic changes in the left lung. Electronically Signed  By: [name] M.D.   On: 07/12/2022 04:08    EKG: Independently reviewed. Sinus rhythm, PACs. No acute ischemic changes.   Assessment and Plan  Acute COPD exacerbation Possible developing pneumonia Acute on chronic respiratory failure with hypoxemia and hypercapnia Placed on BiPAP in the ED due to increased work of breathing.  ABG showing pH 7.30, pCO2 52.9, and pO2 77.  He is currently stable on BiPAP.  Afebrile, WBC 11.8.  COVID/influenza/RSV PCR negative.  Chest x-ray showing minimal new patchy opacities in the left lung base suspicious for atelectasis versus developing pneumonia. ?Aspiration as per EMS he fell asleep at home with chewing tobacco in his mouth.  Patient received vancomycin and cefepime in the ED, check procalcitonin level.  Start Solu-Medrol 60 mg every 12 hours, DuoNeb every 8 hours, albuterol neb every 2 hours as needed, RT consulted.  AKI Creatinine 1.6, baseline 1.0.  IV fluid hydration and monitor labs.  Avoid nephrotoxic agents/hold Lasix and losartan.  Abdominal distention No abdominal pain or tenderness.  KUB ordered.  ?Ascites but no history of cirrhosis.  History of alcohol use Patient denies ongoing alcohol use.  No signs of withdrawal at this time.  CIWA monitoring ordered.  Type 2 diabetes A1c 6.2 on 06/14/2022.  Sensitive sliding scale insulin every 4 hours at this time as patient is n.p.o. due to BiPAP.  Hypertension Currently normotensive.  Holding Lasix and losartan due to AKI.  Continue metoprolol after pharmacy med rec is done and no longer n.p.o.  DVT prophylaxis:  Lovenox Code Status: Full Code (discussed with the patient) Family Communication: No family available at this time. Level of care: Progressive Care Unit Admission status: It is my clinical opinion that admission to INPATIENT is reasonable and necessary because of the expectation that this patient will require hospital care that crosses at least 2 midnights to treat this condition based on the medical complexity of the problems presented.  Given the aforementioned information, the predictability of an adverse outcome is felt to be significant.   John Giovanni MD [facility]  If 7PM-7AM, please contact night-coverage [email or web address]  07/12/2022, 5:04 AM

## 2022-07-12 NOTE — Progress Notes (Signed)
Pharmacy Antibiotic Note  James [name] is a 62 y.o. male admitted on 07/12/2022 with concern for COPD exacerbation/pneumonia.  Pharmacy has been consulted for vancomycin and cefepime dosing. Renal function slightly improved (Scr 1.46, BL ~1).   Plan: Cefepime 2G IV q8 hours Vancomcyin 2G IV q24 hours (eAUC 500)  Height: 5\' 9"  (175.3 cm) Weight: 98.4 kg (216 lb 14.9 oz) IBW/kg (Calculated) : 70.7  Temp (24hrs), Avg:98.6 F (37 C), Min:98.5 F (36.9 C), Max:98.6 F (37 C)  Recent Labs  Lab 07/12/22 0340 07/12/22 0540  WBC 11.8*  --   CREATININE 1.68* 1.46*     Estimated Creatinine Clearance: 61.5 mL/min (A) (by C-G formula based on SCr of 1.46 mg/dL (H)).    No Known Allergies   Thank you for allowing pharmacy to be a part of this patient's care.  Marja Kays 07/12/2022 8:07 AM

## 2022-07-12 NOTE — ED Notes (Signed)
ED TO INPATIENT HANDOFF REPORT  ED Nurse Name and Phone #: Percival Spanish [phone]  S Name/Age/Gender James [name] 62 y.o. male Room/Bed: 036C/036C  Code Status   Code Status: Full Code  Home/SNF/Other Home Patient oriented to: self, place, time, and situation Is this baseline? Yes   Triage Complete: Triage complete  Chief Complaint COPD with acute exacerbation (HCC) [J44.1]  Triage Note Pt BIB [facility] from home alone. Pt woke up SOB and abd distention. Pt wears 2L [place] at baseline. Per ems Pt fell asleep with chewing tobacco.   Allergies No Known Allergies  Level of Care/Admitting Diagnosis ED Disposition     ED Disposition  Admit   Condition  --   Comment  Hospital Area: [facility] [[id number]]  Level of Care: Progressive [102]  Admit to Progressive based on following criteria: RESPIRATORY PROBLEMS hypoxemic/hypercapnic respiratory failure that is responsive to NIPPV (BiPAP) or High Flow Nasal Cannula (6-80 lpm). Frequent assessment/intervention, no > Q2 hrs < Q4 hrs, to maintain oxygenation and pulmonary hygiene.  May admit patient to [facility] or [facility] if equivalent level of care is available:: Yes  Covid Evaluation: Asymptomatic - no recent exposure (last 10 days) testing not required  Diagnosis: COPD with acute exacerbation [facility]) [[id number]]  Admitting Physician: John Giovanni [[id number]]  Attending Physician: John Giovanni [[id number]]  Certification:: I certify this patient will need inpatient services for at least 2 midnights  Estimated Length of Stay: 2          B Medical/Surgery History Past Medical History:  Diagnosis Date   Acute on chronic respiratory failure with hypoxia (HCC) 06/08/2013   Acute on chronic respiratory failure with hypoxia and hypercapnia (HCC) 06/08/2013   Alcohol use disorder 11/23/2018   CAP (community acquired pneumonia) 05/17/2018   See admit 05/17/18 ? rml  with   covid pcr neg - rx augmentin > f/u cxr in 4-6  weeks is fine unless condition declines    Community acquired pneumonia 07/12/2021   Community acquired pneumonia of right lower lobe of lung 05/17/2018   See admit 05/17/18 ? 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   Recurrent pneumonia 02/20/2021   Tick bite 06/03/2018   Past Surgical History:  Procedure Laterality Date   LEFT HEART CATH AND CORONARY ANGIOGRAPHY N/A 12/07/2021   Procedure: LEFT HEART CATH AND CORONARY ANGIOGRAPHY;  Surgeon: Runell Gess, MD;  Location: [facility] INVASIVE CV LAB;  Service: Cardiovascular;  Laterality: N/A;   LUNG REMOVAL, PARTIAL     VIDEO ASSISTED THORACOSCOPY (VATS)/THOROCOTOMY Left 06/11/2013   Procedure: VIDEO ASSISTED THORACOSCOPY (VATS)/THOROCOTOMY;  Surgeon: Kerin Perna, MD;  Location: [facility] OR;  Service: Thoracic;  Laterality: Left;   VIDEO BRONCHOSCOPY N/A 06/11/2013   Procedure: VIDEO BRONCHOSCOPY;  Surgeon: Kerin Perna, MD;  Location: [facility] OR;  Service: Thoracic;  Laterality: N/A;   VIDEO BRONCHOSCOPY N/A 08/10/2018   Procedure: VIDEO BRONCHOSCOPY WITH FLUORO;  Surgeon: Lorin Glass, MD;  Location: [facility] ENDOSCOPY;  Service:  Endoscopy;  Laterality: N/A;     A IV Location/Drains/Wounds Patient Lines/Drains/Airways Status     Active Line/Drains/Airways     Name Placement date Placement time Site Days   Peripheral IV 07/12/22 20 G Anterior;Distal;Right;Upper Antecubital 07/12/22  --  Antecubital  less than 1   Wound / Incision (Open or Dehisced) 12/02/21 Irritant Dermatitis  (Moisture Associated Skin Damage) Groin Right;Left blotchy red 12/02/21  2000  Groin  222   Wound / Incision (Open or Dehisced) 12/02/21 Irritant Dermatitis (Moisture Associated Skin Damage) Buttocks Right;Left blotchy redness 12/02/21  2000  Buttocks  222            Intake/Output Last 24 hours  Intake/Output Summary (Last 24 hours) at 07/12/2022 1815 Last data filed at 07/12/2022 1416 Gross per 24 hour  Intake 600.51 ml  Output --  Net 600.51 ml    Labs/Imaging Results for orders placed or performed during the hospital encounter of 07/12/22 (from the past 48 hour(s))  CBC with Differential     Status: Abnormal   Collection Time: 07/12/22  3:40 AM  Result Value Ref Range   WBC 11.8 (H) 4.0 - 10.5 K/uL   RBC 3.83 (L) 4.22 - 5.81 MIL/uL   Hemoglobin 11.4 (L) 13.0 - 17.0 g/dL   HCT 16.1 (L) 09.6 - 04.5 %   MCV 97.1 80.0 - 100.0 fL   MCH 29.8 26.0 - 34.0 pg   MCHC 30.6 30.0 - 36.0 g/dL   RDW 40.9 81.1 - 91.4 %   Platelets 328 150 - 400 K/uL   nRBC 0.0 0.0 - 0.2 %   Neutrophils Relative % 71 %   Neutro Abs 8.4 (H) 1.7 - 7.7 K/uL   Lymphocytes Relative 18 %   Lymphs Abs 2.1 0.7 - 4.0 K/uL   Monocytes Relative 9 %   Monocytes Absolute 1.0 0.1 - 1.0 K/uL   Eosinophils Relative 1 %   Eosinophils Absolute 0.1 0.0 - 0.5 K/uL   Basophils Relative 0 %   Basophils Absolute 0.1 0.0 - 0.1 K/uL   Immature Granulocytes 1 %   Abs Immature Granulocytes 0.11 (H) 0.00 - 0.07 K/uL    Comment: Performed at [facility], [address] [place], [place] [address]  Basic metabolic panel     Status: Abnormal   Collection Time: 07/12/22  3:40 AM  Result Value Ref Range   Sodium 136 135 - 145 mmol/L   Potassium 4.5 3.5 - 5.1 mmol/L   Chloride 98 98 - 111 mmol/L   CO2 26 22 - 32 mmol/L   Glucose, Bld 129 (H) 70 - 99 mg/dL    Comment: Glucose reference range applies only to samples taken after fasting for at least 8 hours.   BUN 33 (H) 8 - 23 mg/dL   Creatinine, Ser 6.21 (H) 0.61 - 1.24  mg/dL   Calcium 8.9 8.9 - 30.8 mg/dL   GFR, Estimated 46 (L) >60 mL/min    Comment: (NOTE) Calculated using the CKD-EPI Creatinine Equation (2021)    Anion gap 12 5 - 15    Comment: Performed at [facility], [address] [place], [place] [address]  Brain natriuretic peptide     Status: None   Collection Time: 07/12/22  3:40 AM  Result Value Ref Range   B Natriuretic Peptide 54.6 0.0 - 100.0 pg/mL    Comment: Performed at [facility], [address] [place], [place] [address]  Resp panel by RT-PCR (RSV,  Flu A&B, Covid) Anterior Nasal Swab     Status: None   Collection Time: 07/12/22  3:40 AM   Specimen: Anterior Nasal Swab  Result Value Ref Range   SARS Coronavirus 2 by RT PCR NEGATIVE NEGATIVE   Influenza A by PCR NEGATIVE NEGATIVE   Influenza B by PCR NEGATIVE NEGATIVE    Comment: (NOTE) The Xpert Xpress SARS-CoV-2/FLU/RSV plus assay is intended as an aid in the diagnosis of influenza from Nasopharyngeal swab specimens and should not be used as a sole basis for treatment. Nasal washings and aspirates are unacceptable for Xpert Xpress SARS-CoV-2/FLU/RSV testing.  Fact Sheet for Patients: BloggerCourse.com  Fact Sheet for Healthcare Providers: SeriousBroker.it  This test is not yet approved or cleared by the [place] FDA and has been authorized for detection and/or diagnosis of SARS-CoV-2 by FDA under an Emergency Use Authorization (EUA). This EUA will remain in effect (meaning this test can be used) for the duration of the COVID-19 declaration under Section 564(b)(1) of the Act, 21 U.S.C. section 360bbb-3(b)(1), unless the authorization is terminated or revoked.     Resp Syncytial Virus by PCR NEGATIVE NEGATIVE    Comment: (NOTE) Fact Sheet for Patients: BloggerCourse.com  Fact Sheet for Healthcare Providers: SeriousBroker.it  This test is not yet  approved or cleared by the [place] FDA and has been authorized for detection and/or diagnosis of SARS-CoV-2 by FDA under an Emergency Use Authorization (EUA). This EUA will remain in effect (meaning this test can be used) for the duration of the COVID-19 declaration under Section 564(b)(1) of the Act, 21 U.S.C. section 360bbb-3(b)(1), unless the authorization is terminated or revoked.  Performed at [facility], [address] [place], [place] [address]   Troponin I (High Sensitivity)     Status: None   Collection Time: 07/12/22  3:40 AM  Result Value Ref Range   Troponin I (High Sensitivity) 17 <18 ng/L    Comment: (NOTE) Elevated high sensitivity troponin I (hsTnI) values and significant  changes across serial measurements may suggest ACS but many other  chronic and acute conditions are known to elevate hsTnI results.  Refer to the "Links" section for chest pain algorithms and additional  guidance. Performed at [facility], [address], [place], [place] [address]   I-Stat arterial blood gas, ED [facility] ED, [facility], [facility])     Status: Abnormal   Collection Time: 07/12/22  4:11 AM  Result Value Ref Range   pH, Arterial 7.308 (L) 7.35 - 7.45   pCO2 arterial 52.1 (H) 32 - 48 mmHg   pO2, Arterial 77 (L) 83 - 108 mmHg   Bicarbonate 26.1 20.0 - 28.0 mmol/L   TCO2 28 22 - 32 mmol/L   O2 Saturation 94 %   Acid-base deficit 1.0 0.0 - 2.0 mmol/L   Sodium 135 135 - 145 mmol/L   Potassium 4.4 3.5 - 5.1 mmol/L   Calcium, Ion 1.21 1.15 - 1.40 mmol/L   HCT 36.0 (L) 39.0 - 52.0 %   Hemoglobin 12.2 (L) 13.0 - 17.0 g/dL   Patient temperature 09.8 F    Collection site RADIAL, ALLEN'S TEST ACCEPTABLE    Drawn by Operator    Sample type ARTERIAL   Troponin I (High Sensitivity)     Status: Abnormal   Collection Time: 07/12/22  5:40 AM  Result Value Ref Range   Troponin I (High Sensitivity) 20 (H) <18 ng/L    Comment: (NOTE) Elevated high sensitivity troponin I (hsTnI) values  and significant  changes across serial measurements may suggest ACS but many other  chronic and acute conditions are known to elevate hsTnI results.  Refer to the "Links" section for chest pain algorithms and additional  guidance. Performed at [facility], [address] [place], [place] [address]   Basic metabolic panel     Status: Abnormal   Collection Time: 07/12/22  5:40 AM  Result Value Ref Range   Sodium 135 135 - 145 mmol/L   Potassium 4.6 3.5 - 5.1 mmol/L   Chloride 98 98 - 111 mmol/L   CO2 26 22 - 32 mmol/L   Glucose, Bld 205 (H) 70 - 99 mg/dL    Comment: Glucose reference range applies only to samples taken after fasting for at least 8 hours.   BUN 33 (H) 8 - 23 mg/dL   Creatinine, Ser 6.04 (H) 0.61 - 1.24 mg/dL   Calcium 8.6 (L) 8.9 - 10.3 mg/dL   GFR, Estimated 54 (L) >60 mL/min    Comment: (NOTE) Calculated using the CKD-EPI Creatinine Equation (2021)    Anion gap 11 5 - 15    Comment: Performed at [facility], [address], [place], [place] [address]  Procalcitonin     Status: None   Collection Time: 07/12/22  5:40 AM  Result Value Ref Range   Procalcitonin <0.10 ng/mL    Comment:        Interpretation: PCT (Procalcitonin) <= 0.5 ng/mL: Systemic infection (sepsis) is not likely. Local bacterial infection is possible. (NOTE)       Sepsis PCT Algorithm           Lower Respiratory Tract                                      Infection PCT Algorithm    ----------------------------     ----------------------------         PCT < 0.25 ng/mL                PCT < 0.10 ng/mL          Strongly encourage             Strongly discourage   discontinuation of antibiotics    initiation of antibiotics    ----------------------------     -----------------------------       PCT 0.25 - 0.50 ng/mL            PCT 0.10 - 0.25 ng/mL               OR       >80% decrease in PCT            Discourage initiation of                                             antibiotics      Encourage discontinuation           of antibiotics    ----------------------------     -----------------------------         PCT >= 0.50 ng/mL              PCT 0.26 - 0.50 ng/mL               AND        <  80% decrease in PCT             Encourage initiation of                                             antibiotics       Encourage continuation           of antibiotics    ----------------------------     -----------------------------        PCT >= 0.50 ng/mL                  PCT > 0.50 ng/mL               AND         increase in PCT                  Strongly encourage                                      initiation of antibiotics    Strongly encourage escalation           of antibiotics                                     -----------------------------                                           PCT <= 0.25 ng/mL                                                 OR                                        > 80% decrease in PCT                                      Discontinue / Do not initiate                                             antibiotics  Performed at [facility], [address], [place], [place] [address]   CBG monitoring, ED     Status: Abnormal   Collection Time: 07/12/22  5:46 AM  Result Value Ref Range   Glucose-Capillary 199 (H) 70 - 99 mg/dL    Comment: Glucose reference range applies only to samples taken after fasting for at least 8 hours.  CBG monitoring, ED     Status: Abnormal   Collection Time: 07/12/22  8:11 AM  Result Value Ref Range   Glucose-Capillary 243 (H) 70 - 99 mg/dL    Comment: Glucose reference range applies only to samples  taken after fasting for at least 8 hours.  CBG monitoring, ED     Status: Abnormal   Collection Time: 07/12/22 11:40 AM  Result Value Ref Range   Glucose-Capillary 226 (H) 70 - 99 mg/dL    Comment: Glucose reference range applies only to samples taken after fasting for at least 8 hours.  CBG monitoring,  ED     Status: Abnormal   Collection Time: 07/12/22  3:36 PM  Result Value Ref Range   Glucose-Capillary 260 (H) 70 - 99 mg/dL    Comment: Glucose reference range applies only to samples taken after fasting for at least 8 hours.  CBG monitoring, ED     Status: Abnormal   Collection Time: 07/12/22  3:56 PM  Result Value Ref Range   Glucose-Capillary 268 (H) 70 - 99 mg/dL    Comment: Glucose reference range applies only to samples taken after fasting for at least 8 hours.   DG Abd 1 View  Result Date: 07/12/2022 CLINICAL DATA:  Abdominal bloating EXAM: ABDOMEN - 1 VIEW COMPARISON:  06/19/2022 FINDINGS: Normal bowel gas pattern. No abnormal stool retention. No concerning mass effect or calcification. Remote and healed right lower rib fractures. Clear lung bases. IMPRESSION: No acute finding. Electronically Signed   By: Tiburcio Pea M.D.   On: 07/12/2022 06:15   DG Chest Portable 1 View  Result Date: 07/12/2022 CLINICAL DATA:  Shortness of breath EXAM: PORTABLE CHEST 1 VIEW COMPARISON:  Chest x-ray 06/14/2022.  CT of the chest 02/22/2022. FINDINGS: Multiple healed left-sided rib fractures are again seen. There is blunting of the left costophrenic angle likely related to scarring., unchanged. There is some minimal new patchy opacities in the left lung base. Cardiomediastinal silhouette is within normal limits. IMPRESSION: 1. Minimal new patchy opacities in the left lung base may represent atelectasis or developing infection. 2. Chronic changes in the left lung. Electronically Signed   By: [name] M.D.   On: 07/12/2022 04:08    Pending Labs Unresulted Labs (From admission, onward)    None       Vitals/Pain Today's Vitals   07/12/22 1500 07/12/22 1600 07/12/22 1700 07/12/22 1759  BP: 127/71 (!) 97/41 125/74   Pulse: 89 98 81   Resp: 17 18 16    Temp:    98.2 F (36.8 C)  TempSrc:    Oral  SpO2: 96% 97% 98%   Weight:      Height:      PainSc:        Isolation Precautions No  active isolations  Medications Medications  albuterol (PROVENTIL) (2.5 MG/3ML) 0.083% nebulizer solution (  Total Dose 07/12/22 0406)  enoxaparin (LOVENOX) injection 40 mg (40 mg Subcutaneous Given 07/12/22 1336)  acetaminophen (TYLENOL) tablet 650 mg (has no administration in time range)    Or  acetaminophen (TYLENOL) suppository 650 mg (has no administration in time range)  methylPREDNISolone sodium succinate (SOLU-MEDROL) 125 mg/2 mL injection 120 mg (120 mg Intravenous Given 07/12/22 0555)  insulin aspart (novoLOG) injection 0-9 Units (5 Units Subcutaneous Given 07/12/22 1541)  albuterol (PROVENTIL) (2.5 MG/3ML) 0.083% nebulizer solution 2.5 mg (has no administration in time range)  ipratropium-albuterol (DUONEB) 0.5-2.5 (3) MG/3ML nebulizer solution 3 mL (3 mLs Nebulization Given 07/12/22 1343)  ceFEPIme (MAXIPIME) 2 g in sodium chloride 0.9 % 100 mL IVPB (0 g Intravenous Stopped 07/12/22 1416)  vancomycin (VANCOREADY) IVPB 2000 mg/400 mL (has no administration in time range)  albuterol (PROVENTIL) (2.5 MG/3ML) 0.083% nebulizer solution (10 mg/hr Nebulization  Given 07/12/22 0355)  vancomycin (VANCOREADY) IVPB 2000 mg/400 mL (0 mg Intravenous Stopped 07/12/22 0647)  ceFEPIme (MAXIPIME) 2 g in sodium chloride 0.9 % 100 mL IVPB (0 g Intravenous Stopped 07/12/22 0555)    Mobility walks     Focused Assessments Pulmonary Assessment Handoff:  Lung sounds: Bilateral Breath Sounds: Diminished, Expiratory wheezes O2 Device: Nasal Cannula O2 Flow Rate (L/min): 3 L/min    R Recommendations: See Admitting Provider Note  Report given to:   Additional Notes:

## 2022-07-12 NOTE — ED Triage Notes (Signed)
Pt BIB [facility] from home alone. Pt woke up SOB and abd distention. Pt wears 2L [place] at baseline. Per ems Pt fell asleep with chewing tobacco.

## 2022-07-12 NOTE — Inpatient Diabetes Management (Signed)
Inpatient Diabetes Program Recommendations  AACE/ADA: New Consensus Statement on Inpatient Glycemic Control (2015)  Target Ranges:  Prepandial:   less than 140 mg/dL      Peak postprandial:   less than 180 mg/dL (1-2 hours)      Critically ill patients:  140 - 180 mg/dL   Lab Results  Component Value Date   GLUCAP 243 (H) 07/12/2022   HGBA1C 6.2 (H) 06/14/2022    Review of Glycemic Control  Latest Reference Range & Units 07/12/22 05:46 07/12/22 08:11  Glucose-Capillary 70 - 99 mg/dL 782 (H) 956 (H)   Diabetes history: DM 2 Outpatient Diabetes medications: Glipizide 5 mg Daily Current orders for Inpatient glycemic control:  Novolog 0-9 units Q4 hours  120 mg Solumedrol Daily A1c 6.2% on 6/10  Inpatient Diabetes Program Recommendations:    -   Increase Novolog correction scale to 0-15 units Q4 hours.  Thanks,  Christena Deem RN, MSN, BC-ADM Inpatient Diabetes Coordinator Team Pager [phone] (8a-5p)

## 2022-07-12 NOTE — Progress Notes (Signed)
Patient seen and examined.  He was still in the emergency room.  Admitted early morning hours by nighttime hospitalist.  See assessment plan.  62 year old gentleman with history of COPD and uses 4 L oxygen at home, has history of recurrent pneumonia, drinks 3-4 beers a day.  He tells me he also had fallen about a week ago and broken his ribs.  He was having shortness of breath worse for about a day and severe symptoms yesterday so came to the ER.  He was treated as COPD exacerbation, chest taken on BiPAP and currently on 3 to 4 L of oxygen and doing fairly well.  Treating as COPD with acute exacerbation as well as superadded bacterial infection.  Remains on broad-spectrum antibiotics, will continue for next 24 hours and hopefully can de-escalate by tomorrow after culture reports are available.  Patient will stay on treatment with COPD pathway, BiPAP will be used as needed for respiratory distress or increased work of breathing.  Stable to admit to progressive care unit as he may need BiPAP as needed.  Total time spent: 30 minutes.  No charge visit.  Same-day admit.

## 2022-07-13 ENCOUNTER — Inpatient Hospital Stay (HOSPITAL_COMMUNITY): Payer: Medicaid Other

## 2022-07-13 ENCOUNTER — Ambulatory Visit (INDEPENDENT_AMBULATORY_CARE_PROVIDER_SITE_OTHER): Payer: Medicaid Other | Admitting: Podiatry

## 2022-07-13 DIAGNOSIS — Z91199 Patient's noncompliance with other medical treatment and regimen due to unspecified reason: Secondary | ICD-10-CM

## 2022-07-13 DIAGNOSIS — R140 Abdominal distension (gaseous): Secondary | ICD-10-CM | POA: Diagnosis not present

## 2022-07-13 DIAGNOSIS — J441 Chronic obstructive pulmonary disease with (acute) exacerbation: Secondary | ICD-10-CM | POA: Diagnosis not present

## 2022-07-13 DIAGNOSIS — J9811 Atelectasis: Secondary | ICD-10-CM | POA: Diagnosis not present

## 2022-07-13 LAB — RESPIRATORY PANEL BY PCR

## 2022-07-13 LAB — MRSA NEXT GEN BY PCR, NASAL: MRSA by PCR Next Gen: NOT DETECTED

## 2022-07-13 LAB — GLUCOSE, CAPILLARY
Glucose-Capillary: 115 mg/dL — ABNORMAL HIGH (ref 70–99)
Glucose-Capillary: 136 mg/dL — ABNORMAL HIGH (ref 70–99)
Glucose-Capillary: 157 mg/dL — ABNORMAL HIGH (ref 70–99)
Glucose-Capillary: 157 mg/dL — ABNORMAL HIGH (ref 70–99)
Glucose-Capillary: 177 mg/dL — ABNORMAL HIGH (ref 70–99)
Glucose-Capillary: 222 mg/dL — ABNORMAL HIGH (ref 70–99)

## 2022-07-13 MED ORDER — PREGABALIN 100 MG PO CAPS
100.0000 mg | ORAL_CAPSULE | Freq: Two times a day (BID) | ORAL | Status: DC
  Administered 2022-07-13 – 2022-07-16 (×7): 100 mg via ORAL
  Filled 2022-07-13 (×7): qty 1

## 2022-07-13 MED ORDER — INSULIN ASPART 100 UNIT/ML IJ SOLN
0.0000 [IU] | Freq: Three times a day (TID) | INTRAMUSCULAR | Status: DC
  Administered 2022-07-13: 5 [IU] via SUBCUTANEOUS
  Administered 2022-07-13 – 2022-07-15 (×5): 3 [IU] via SUBCUTANEOUS
  Administered 2022-07-16: 2 [IU] via SUBCUTANEOUS

## 2022-07-13 MED ORDER — ONDANSETRON HCL 4 MG/2ML IJ SOLN: 4.0000 mg | Freq: Four times a day (QID) | INTRAMUSCULAR | Status: DC | PRN

## 2022-07-13 MED ORDER — METOPROLOL SUCCINATE ER 50 MG PO TB24
50.0000 mg | ORAL_TABLET | Freq: Every day | ORAL | Status: DC
  Administered 2022-07-13 – 2022-07-16 (×4): 50 mg via ORAL
  Filled 2022-07-13 (×4): qty 1

## 2022-07-13 MED ORDER — INSULIN GLARGINE-YFGN 100 UNIT/ML ~~LOC~~ SOLN
10.0000 [IU] | Freq: Every day | SUBCUTANEOUS | Status: DC
  Administered 2022-07-13 – 2022-07-16 (×4): 10 [IU] via SUBCUTANEOUS
  Filled 2022-07-13 (×4): qty 0.1

## 2022-07-13 MED ORDER — TRAZODONE HCL 50 MG PO TABS
50.0000 mg | ORAL_TABLET | Freq: Every evening | ORAL | Status: DC | PRN
  Administered 2022-07-13 – 2022-07-14 (×2): 50 mg via ORAL
  Filled 2022-07-13 (×2): qty 1

## 2022-07-13 MED ORDER — PANTOPRAZOLE SODIUM 40 MG PO TBEC
40.0000 mg | DELAYED_RELEASE_TABLET | Freq: Every day | ORAL | Status: DC
  Administered 2022-07-13 – 2022-07-16 (×4): 40 mg via ORAL
  Filled 2022-07-13 (×4): qty 1

## 2022-07-13 MED ORDER — ATORVASTATIN CALCIUM 10 MG PO TABS
10.0000 mg | ORAL_TABLET | Freq: Every day | ORAL | Status: DC
  Administered 2022-07-13 – 2022-07-16 (×4): 10 mg via ORAL
  Filled 2022-07-13 (×4): qty 1

## 2022-07-13 MED ORDER — GUAIFENESIN 100 MG/5ML PO LIQD: 5.0000 mL | ORAL | Status: DC | PRN

## 2022-07-13 MED ORDER — AZITHROMYCIN 500 MG PO TABS
250.0000 mg | ORAL_TABLET | Freq: Every day | ORAL | Status: DC
  Administered 2022-07-13 – 2022-07-16 (×4): 250 mg via ORAL
  Filled 2022-07-13 (×4): qty 1

## 2022-07-13 MED ORDER — INSULIN ASPART 100 UNIT/ML IJ SOLN
3.0000 [IU] | Freq: Three times a day (TID) | INTRAMUSCULAR | Status: DC
  Administered 2022-07-13 – 2022-07-16 (×9): 3 [IU] via SUBCUTANEOUS

## 2022-07-13 MED ORDER — INSULIN ASPART 100 UNIT/ML IJ SOLN
0.0000 [IU] | Freq: Every day | INTRAMUSCULAR | Status: DC
  Administered 2022-07-14 – 2022-07-15 (×2): 2 [IU] via SUBCUTANEOUS

## 2022-07-13 MED ORDER — BUDESONIDE 0.5 MG/2ML IN SUSP
0.5000 mg | Freq: Two times a day (BID) | RESPIRATORY_TRACT | Status: DC
  Administered 2022-07-13 – 2022-07-16 (×5): 0.5 mg via RESPIRATORY_TRACT
  Filled 2022-07-13 (×5): qty 2

## 2022-07-13 MED ORDER — SODIUM CHLORIDE 0.9 % IV SOLN
INTRAVENOUS | Status: AC
  Administered 2022-07-13: 75 mL via INTRAVENOUS

## 2022-07-13 MED ORDER — IPRATROPIUM-ALBUTEROL 0.5-2.5 (3) MG/3ML IN SOLN
3.0000 mL | Freq: Two times a day (BID) | RESPIRATORY_TRACT | Status: DC
  Administered 2022-07-14 – 2022-07-16 (×4): 3 mL via RESPIRATORY_TRACT
  Filled 2022-07-13 (×5): qty 3

## 2022-07-13 NOTE — TOC Initial Note (Signed)
Transition of Care [facility]) - Initial/Assessment Note    Patient Details  Name: James [name] MRN: [id number] Date of Birth: 1960-06-18  Transition of Care [facility]) CM/SW Contact:    Lawerance Sabal, RN Phone Number: 07/13/2022, 2:40 PM  Clinical Narrative:                  Attempt to assess at bedside earlier, patient asleep.  Spoke w patient on the phone, he states that he lives at home alone. He confirms that he is active with paramedicine, and population health patient outreach RN.  Patient has home oxygen, [employer], states he uses 2L continuous.  He states that he will not have any family or froends to assist with transportation home.   He will likely need portable tank and cab voucher for DC    Expected Discharge Plan: Home/Self Care Barriers to Discharge: Continued Medical Work up   Patient Goals and CMS Choice Patient states their goals for this hospitalization and ongoing recovery are:: return home          Expected Discharge Plan and Services   Discharge Planning Services: CM Consult   Living arrangements for the past 2 months: Single Family Home                 DME Arranged: N/A                    Prior Living Arrangements/Services Living arrangements for the past 2 months: Single Family Home Lives with:: Self              Current home services: DME (oxygen)    Activities of Daily Living Home Assistive Devices/Equipment: None ADL Screening (condition at time of admission) Patient's cognitive ability adequate to safely complete daily activities?: Yes Is the patient deaf or have difficulty hearing?: Yes Does the patient have difficulty seeing, even when wearing glasses/contacts?: No Does the patient have difficulty concentrating, remembering, or making decisions?: No Patient able to express need for assistance with ADLs?: Yes Does the patient have difficulty dressing or bathing?: No Independently performs ADLs?: Yes (appropriate for developmental  age) Does the patient have difficulty walking or climbing stairs?: No Weakness of Legs: None Weakness of Arms/Hands: None  Permission Sought/Granted                  Emotional Assessment              Admission diagnosis:  COPD exacerbation (HCC) [J44.1] AKI (acute kidney injury) (HCC) [N17.9] COPD with acute exacerbation (HCC) [J44.1] HCAP (healthcare-associated pneumonia) [J18.9] Patient Active Problem List   Diagnosis Date Noted   AKI (acute kidney injury) (HCC) 07/12/2022   Abdominal distention 07/12/2022   Alcohol use 07/12/2022   Alcohol dependence in remission (HCC) 03/02/2022   Aortic root dilation (HCC) 03/02/2022   Vision changes 12/29/2021   Oropharyngeal dysphagia 12/29/2021   Onychomycosis 12/29/2021   Malnutrition of moderate degree 12/05/2021   Tinea cruris 07/21/2021   Multiple lung nodules on CT 06/11/2021   COPD with chronic bronchitis 02/01/2021   GERD (gastroesophageal reflux disease) 02/01/2021   Neuropathy 09/29/2020   COPD with acute exacerbation (HCC) 03/26/2020   Bronchiectasis (HCC) 09/24/2019   Type 2 diabetes mellitus (HCC) 07/24/2019   Other atopic dermatitis 05/01/2019   Chronic respiratory failure with hypoxia (HCC) 04/23/2019   Intellectual disability 04/23/2019   HTN (hypertension) 05/17/2016   Former tobacco use 04/06/2016   COPD mixed type (HCC) 03/26/2016   Acute on chronic  respiratory failure with hypoxia and hypercapnia (HCC) 06/08/2013   PCP:  [name], MD Pharmacy:   [facility] [address], [address] [name] [address] Phone: [phone] Fax: [phone]     Social Determinants of Health (SDOH) Social History: SDOH Screenings   Food Insecurity: No Food Insecurity (07/12/2022)  Housing: Low Risk  (07/12/2022)  Transportation Needs: No Transportation Needs (07/12/2022)  Recent Concern: Transportation Needs - Unmet Transportation Needs (06/30/2022)   Utilities: Not At Risk (07/12/2022)  Alcohol Screen: Low Risk  (09/09/2021)  Depression (PHQ2-9): Medium Risk (12/29/2021)  Financial Resource Strain: Low Risk  (12/25/2020)  Recent Concern: Financial Resource Strain - High Risk (10/29/2020)  Physical Activity: Inactive (12/11/2020)  Social Connections: Moderately Integrated (11/13/2020)  Stress: No Stress Concern Present (12/25/2020)  Tobacco Use: Medium Risk (07/12/2022)   SDOH Interventions:     Readmission Risk Interventions    02/04/2021   12:44 PM 08/14/2020    9:59 AM 04/24/2020    1:03 PM  Readmission Risk Prevention Plan  Post Dischage Appt Complete    Medication Screening Complete    Transportation Screening Complete Complete Complete  PCP or Specialist Appt within 3-5 Days  Complete   Not Complete comments  patient already has an appointment schedules with Dr Delford Field   [facility] or Home Care Consult  Complete   Social Work Consult for Recovery Care Planning/Counseling  Complete   Palliative Care Screening  Not Applicable   Medication Review (RN Care Manager)  Complete   PCP or Specialist appointment within 3-5 days of discharge   Not Complete  PCP/Specialist Appt Not Complete comments   Pt had existing appt 5/23--this is first available per [facility]  [facility] or Home Care Consult   Complete  SW Recovery Care/Counseling Consult   Complete  Palliative Care Screening   Complete  Skilled Nursing Facility   Not Applicable

## 2022-07-13 NOTE — Progress Notes (Signed)
No show

## 2022-07-13 NOTE — Progress Notes (Signed)
PROGRESS NOTE    James [name]  ZOX:[id number] DOB: [birth year] DOA: 07/12/2022 PCP: [name], MD   Brief Narrative:  62 year old with history of COPD, chronic hypoxia on 4 L nasal cannula, recurrent pneumonia, drinks 3-4 beers at home daily, HTN, GERD, DM2 comes to the hospital with complaints of shortness of breath.  Patient was found to be in COPD exacerbation   Assessment & Plan:  Principal Problem:   COPD with acute exacerbation (HCC) Active Problems:   Acute on chronic respiratory failure with hypoxia and hypercapnia (HCC)   HTN (hypertension)   Type 2 diabetes mellitus (HCC)   AKI (acute kidney injury) (HCC)   Abdominal distention   Alcohol use     Acute COPD exacerbation Possible developing pneumonia Acute on chronic respiratory failure with hypoxemia and hypercapnia Initially patient required BiPAP, will slowly wean off to home supplemental oxygen - BNP and procalcitonin negative. Resp Viral Panel ordered.  - Chest x-ray showing new patchy opacity - Discontinue broad-spectrum antibiotics, will place him on 5 days of oral azithromycin - Continue steroids, bronchodilators scheduled and as needed, I-S/flutter valve.  Pulmicort twice daily  AKI Creatinine 1.6, baseline 1.0. IV fluids.  Holding Lasix and losartan.  A.m. labs pending   Abdominal distention X-ray appears to be normal   History of alcohol use Denies ongoing alcohol use.   Type 2 diabetes A1c 6.2 on 06/14/2022.  Sliding scale and Accu-Cheks.  Semglee 10 units daily added   Hypertension Home Lasix and losartan on hold due to AKI Resume Toprol.  IV as needed  DVT prophylaxis: Lovenox Code Status: Full code Family Communication:   Status is: Inpatient Remains inpatient appropriate because: Severe COPD exacerbation on aggressive IV steroids       Diet Orders (From admission, onward)     Start     Ordered   07/12/22 1015  Diet regular Fluid consistency: Thin  Diet effective now        Question:  Fluid consistency:  Answer:  Thin   07/12/22 1014            Subjective:  Breathing is better, but still sob with minimal exertional  Examination:  General exam: Appears calm and comfortable ; 2L [place] Respiratory system: b/l exp wheezing.  Cardiovascular system: S1 & S2 heard, RRR. No JVD, murmurs, rubs, gallops or clicks. No pedal edema. Gastrointestinal system: Abdomen is nondistended, soft and nontender. No organomegaly or masses felt. Normal bowel sounds heard. Central nervous system: Alert and oriented. No focal neurological deficits. Extremities: Symmetric 5 x 5 power. Skin: No rashes, lesions or ulcers Psychiatry: Judgement and insight appear normal. Mood & affect appropriate.  Objective: Vitals:   07/12/22 1900 07/12/22 2000 07/12/22 2200 07/13/22 0506  BP: (!) 141/93 (!) 146/78 133/76 (!) 148/79  Pulse: 95 92 95 85  Resp: 18  (!) 22 15  Temp:   97.7 F (36.5 C) (!) 97.4 F (36.3 C)  TempSrc:   Oral Oral  SpO2: 95% 96% 93% 98%  Weight:   96.3 kg   Height:   5' 9.5" (1.765 m)     Intake/Output Summary (Last 24 hours) at 07/13/2022 0752 Last data filed at 07/13/2022 0100 Gross per 24 hour  Intake 100.43 ml  Output 550 ml  Net -449.57 ml   Filed Weights   07/12/22 0345 07/12/22 2200  Weight: 98.4 kg 96.3 kg    Scheduled Meds:  enoxaparin (LOVENOX) injection  40 mg Subcutaneous Q24H   insulin aspart  0-9 Units Subcutaneous Q4H   ipratropium-albuterol  3 mL Nebulization Q6H   methylPREDNISolone (SOLU-MEDROL) injection  120 mg Intravenous Daily   Continuous Infusions:  ceFEPime (MAXIPIME) IV 2 g (07/13/22 4098)   vancomycin      Nutritional status     Body mass index is 30.9 kg/m.  Data Reviewed:   CBC: Recent Labs  Lab 07/12/22 0340 07/12/22 0411  WBC 11.8*  --   NEUTROABS 8.4*  --   HGB 11.4* 12.2*  HCT 37.2* 36.0*  MCV 97.1  --   PLT 328  --    Basic Metabolic Panel: Recent Labs  Lab 07/12/22 0340 07/12/22 0411  07/12/22 0540  NA 136 135 135  K 4.5 4.4 4.6  CL 98  --  98  CO2 26  --  26  GLUCOSE 129*  --  205*  BUN 33*  --  33*  CREATININE 1.68*  --  1.46*  CALCIUM 8.9  --  8.6*   GFR: Estimated Creatinine Clearance: 61.4 mL/min (A) (by C-G formula based on SCr of 1.46 mg/dL (H)). Liver Function Tests: No results for input(s): "AST", "ALT", "ALKPHOS", "BILITOT", "PROT", "ALBUMIN" in the last 168 hours. No results for input(s): "LIPASE", "AMYLASE" in the last 168 hours. No results for input(s): "AMMONIA" in the last 168 hours. Coagulation Profile: No results for input(s): "INR", "PROTIME" in the last 168 hours. Cardiac Enzymes: No results for input(s): "CKTOTAL", "CKMB", "CKMBINDEX", "TROPONINI" in the last 168 hours. BNP (last 3 results) No results for input(s): "PROBNP" in the last 8760 hours. HbA1C: No results for input(s): "HGBA1C" in the last 72 hours. CBG: Recent Labs  Lab 07/12/22 1536 07/12/22 1556 07/12/22 2042 07/13/22 0043 07/13/22 0452  GLUCAP 260* 268* 227* 136* 157*   Lipid Profile: No results for input(s): "CHOL", "HDL", "LDLCALC", "TRIG", "CHOLHDL", "LDLDIRECT" in the last 72 hours. Thyroid Function Tests: No results for input(s): "TSH", "T4TOTAL", "FREET4", "T3FREE", "THYROIDAB" in the last 72 hours. Anemia Panel: No results for input(s): "VITAMINB12", "FOLATE", "FERRITIN", "TIBC", "IRON", "RETICCTPCT" in the last 72 hours. Sepsis Labs: Recent Labs  Lab 07/12/22 0540  PROCALCITON <0.10    Recent Results (from the past 240 hour(s))  Resp panel by RT-PCR (RSV, Flu A&B, Covid) Anterior Nasal Swab     Status: None   Collection Time: 07/12/22  3:40 AM   Specimen: Anterior Nasal Swab  Result Value Ref Range Status   SARS Coronavirus 2 by RT PCR NEGATIVE NEGATIVE Final   Influenza A by PCR NEGATIVE NEGATIVE Final   Influenza B by PCR NEGATIVE NEGATIVE Final    Comment: (NOTE) The Xpert Xpress SARS-CoV-2/FLU/RSV plus assay is intended as an aid in the  diagnosis of influenza from Nasopharyngeal swab specimens and should not be used as a sole basis for treatment. Nasal washings and aspirates are unacceptable for Xpert Xpress SARS-CoV-2/FLU/RSV testing.  Fact Sheet for Patients: BloggerCourse.com  Fact Sheet for Healthcare Providers: SeriousBroker.it  This test is not yet approved or cleared by the [place] FDA and has been authorized for detection and/or diagnosis of SARS-CoV-2 by FDA under an Emergency Use Authorization (EUA). This EUA will remain in effect (meaning this test can be used) for the duration of the COVID-19 declaration under Section 564(b)(1) of the Act, 21 U.S.C. section 360bbb-3(b)(1), unless the authorization is terminated or revoked.     Resp Syncytial Virus by PCR NEGATIVE NEGATIVE Final    Comment: (NOTE) Fact Sheet for Patients: BloggerCourse.com  Fact Sheet for Healthcare Providers: SeriousBroker.it  This test is not yet approved or cleared by the [place] and has been authorized for detection and/or diagnosis of SARS-CoV-2 by FDA under an Emergency Use Authorization (EUA). This EUA will remain in effect (meaning this test can be used) for the duration of the COVID-19 declaration under Section 564(b)(1) of the Act, 21 U.S.C. section 360bbb-3(b)(1), unless the authorization is terminated or revoked.  Performed at [facility], [address], [place], [place] [address]          Radiology Studies: DG Abd 1 View  Result Date: 07/12/2022 CLINICAL DATA:  Abdominal bloating EXAM: ABDOMEN - 1 VIEW COMPARISON:  06/19/2022 FINDINGS: Normal bowel gas pattern. No abnormal stool retention. No concerning mass effect or calcification. Remote and healed right lower rib fractures. Clear lung bases. IMPRESSION: No acute finding. Electronically Signed   By: Tiburcio Pea M.D.   On: 07/12/2022  06:15   DG Chest Portable 1 View  Result Date: 07/12/2022 CLINICAL DATA:  Shortness of breath EXAM: PORTABLE CHEST 1 VIEW COMPARISON:  Chest x-ray 06/14/2022.  CT of the chest 02/22/2022. FINDINGS: Multiple healed left-sided rib fractures are again seen. There is blunting of the left costophrenic angle likely related to scarring., unchanged. There is some minimal new patchy opacities in the left lung base. Cardiomediastinal silhouette is within normal limits. IMPRESSION: 1. Minimal new patchy opacities in the left lung base may represent atelectasis or developing infection. 2. Chronic changes in the left lung. Electronically Signed   By: [name] M.D.   On: 07/12/2022 04:08           LOS: 1 day   Time spent= 35 mins    Heaton Sarin Joline Maxcy, MD [facility]  If 7PM-7AM, please contact night-coverage  07/13/2022, 7:52 AM

## 2022-07-14 ENCOUNTER — Ambulatory Visit: Payer: Medicaid Other | Admitting: Critical Care Medicine

## 2022-07-14 DIAGNOSIS — J441 Chronic obstructive pulmonary disease with (acute) exacerbation: Secondary | ICD-10-CM | POA: Diagnosis not present

## 2022-07-14 LAB — BASIC METABOLIC PANEL
Anion gap: 10 (ref 5–15)
BUN: 33 mg/dL — ABNORMAL HIGH (ref 8–23)
CO2: 20 mmol/L — ABNORMAL LOW (ref 22–32)
Calcium: 8.2 mg/dL — ABNORMAL LOW (ref 8.9–10.3)
Chloride: 105 mmol/L (ref 98–111)
Creatinine, Ser: 0.95 mg/dL (ref 0.61–1.24)
GFR, Estimated: >60 mL/min (ref >60)
Glucose, Bld: 230 mg/dL — ABNORMAL HIGH (ref 70–99)
Potassium: 4.9 mmol/L (ref 3.5–5.1)
Sodium: 135 mmol/L (ref 135–145)

## 2022-07-14 LAB — CBC
HCT: 34.5 % — ABNORMAL LOW (ref 39.0–52.0)
Hemoglobin: 10.6 g/dL — ABNORMAL LOW (ref 13.0–17.0)
MCH: 29.3 pg (ref 26.0–34.0)
MCHC: 30.7 g/dL (ref 30.0–36.0)
MCV: 95.3 fL (ref 80.0–100.0)
Platelets: 323 10*3/uL (ref 150–400)
RBC: 3.62 MIL/uL — ABNORMAL LOW (ref 4.22–5.81)
RDW: 14.8 % (ref 11.5–15.5)
WBC: 13.4 10*3/uL — ABNORMAL HIGH (ref 4.0–10.5)
nRBC: 0 % (ref 0.0–0.2)

## 2022-07-14 LAB — GLUCOSE, CAPILLARY
Glucose-Capillary: 99 mg/dL (ref 70–99)
Glucose-Capillary: 165 mg/dL — ABNORMAL HIGH (ref 70–99)
Glucose-Capillary: 200 mg/dL — ABNORMAL HIGH (ref 70–99)
Glucose-Capillary: 208 mg/dL — ABNORMAL HIGH (ref 70–99)

## 2022-07-14 LAB — PHOSPHORUS: Phosphorus: 2.4 mg/dL — ABNORMAL LOW (ref 2.5–4.6)

## 2022-07-14 LAB — MAGNESIUM: Magnesium: 2.4 mg/dL (ref 1.7–2.4)

## 2022-07-14 MED ORDER — LOSARTAN POTASSIUM 50 MG PO TABS
100.0000 mg | ORAL_TABLET | Freq: Every day | ORAL | Status: DC
  Administered 2022-07-14 – 2022-07-16 (×3): 100 mg via ORAL
  Filled 2022-07-14 (×3): qty 2

## 2022-07-14 MED ORDER — FOLIC ACID 1 MG PO TABS
1.0000 mg | ORAL_TABLET | Freq: Every day | ORAL | Status: DC
  Administered 2022-07-14 – 2022-07-16 (×3): 1 mg via ORAL
  Filled 2022-07-14 (×3): qty 1

## 2022-07-14 MED ORDER — SODIUM PHOSPHATES 45 MMOLE/15ML IV SOLN
15.0000 mmol | Freq: Once | INTRAVENOUS | Status: AC
  Administered 2022-07-14: 15 mmol via INTRAVENOUS
  Filled 2022-07-14: qty 5

## 2022-07-14 MED ORDER — LORAZEPAM 2 MG/ML IJ SOLN: 1.0000 mg | INTRAMUSCULAR | Status: DC | PRN

## 2022-07-14 MED ORDER — LORAZEPAM 1 MG PO TABS
1.0000 mg | ORAL_TABLET | ORAL | Status: DC | PRN
  Administered 2022-07-14 – 2022-07-15 (×2): 1 mg via ORAL
  Filled 2022-07-14 (×2): qty 1

## 2022-07-14 MED ORDER — THIAMINE HCL 100 MG/ML IJ SOLN: 100.0000 mg | Freq: Every day | INTRAMUSCULAR | Status: DC

## 2022-07-14 MED ORDER — THIAMINE MONONITRATE 100 MG PO TABS
100.0000 mg | ORAL_TABLET | Freq: Every day | ORAL | Status: DC
  Administered 2022-07-14 – 2022-07-16 (×3): 100 mg via ORAL
  Filled 2022-07-14 (×3): qty 1

## 2022-07-14 MED ORDER — GUAIFENESIN ER 600 MG PO TB12
600.0000 mg | ORAL_TABLET | Freq: Two times a day (BID) | ORAL | Status: DC
  Administered 2022-07-14 – 2022-07-16 (×5): 600 mg via ORAL
  Filled 2022-07-14 (×5): qty 1

## 2022-07-14 MED ORDER — ADULT MULTIVITAMIN W/MINERALS CH
1.0000 | ORAL_TABLET | Freq: Every day | ORAL | Status: DC
  Administered 2022-07-14 – 2022-07-16 (×3): 1 via ORAL
  Filled 2022-07-14 (×3): qty 1

## 2022-07-14 NOTE — TOC Progression Note (Signed)
Transition of Care [facility]) - Progression Note    Patient Details  Name: James [name] MRN: [id number] Date of Birth: March 23, 1960  [facility] CM/SW Contact  Gordy Clement, RN Phone Number: 07/14/2022, 2:51 PM  Clinical Narrative:     CM has reached out to [employer] to request portable O2.. Patient has Home O2 with [employer] but states no portable .   TOC will continue to follow patient for any additional discharge needs      Expected Discharge Plan: Home/Self Care Barriers to Discharge: Continued Medical Work up  Expected Discharge Plan and Services   Discharge Planning Services: CM Consult   Living arrangements for the past 2 months: Single Family Home                 DME Arranged: N/A                     Social Determinants of Health (SDOH) Interventions SDOH Screenings   Food Insecurity: No Food Insecurity (07/12/2022)  Housing: Low Risk  (07/12/2022)  Transportation Needs: No Transportation Needs (07/12/2022)  Recent Concern: Transportation Needs - Unmet Transportation Needs (06/30/2022)  Utilities: Not At Risk (07/12/2022)  Alcohol Screen: Low Risk  (09/09/2021)  Depression (PHQ2-9): Medium Risk (12/29/2021)  Financial Resource Strain: Low Risk  (12/25/2020)  Recent Concern: Financial Resource Strain - High Risk (10/29/2020)  Physical Activity: Inactive (12/11/2020)  Social Connections: Moderately Integrated (11/13/2020)  Stress: No Stress Concern Present (12/25/2020)  Tobacco Use: Medium Risk (07/12/2022)    Readmission Risk Interventions    02/04/2021   12:44 PM 08/14/2020    9:59 AM 04/24/2020    1:03 PM  Readmission Risk Prevention Plan  Post Dischage Appt Complete    Medication Screening Complete    Transportation Screening Complete Complete Complete  PCP or Specialist Appt within 3-5 Days  Complete   Not Complete comments  patient already has an appointment schedules with Dr Delford Field   [facility] or Home Care Consult  Complete   Social Work Consult for  Recovery Care Planning/Counseling  Complete   Palliative Care Screening  Not Applicable   Medication Review (RN Care Manager)  Complete   PCP or Specialist appointment within 3-5 days of discharge   Not Complete  PCP/Specialist Appt Not Complete comments   Pt had existing appt 5/23--this is first available per [facility]  [facility] or Home Care Consult   Complete  SW Recovery Care/Counseling Consult   Complete  Palliative Care Screening   Complete  Skilled Nursing Facility   Not Applicable

## 2022-07-14 NOTE — Progress Notes (Signed)
PROGRESS NOTE    James [name]  EXB:[id number] DOB: [birth year] DOA: 07/12/2022 PCP: [name], MD   Brief Narrative:  62 year old with history of COPD, chronic hypoxia on 4 L nasal cannula, recurrent pneumonia, drinks 3-4 beers at home daily, HTN, GERD, DM2 comes to the hospital with complaints of shortness of breath.  Patient was found to be in COPD exacerbation   Assessment & Plan:  Principal Problem:   COPD with acute exacerbation (HCC) Active Problems:   Acute on chronic respiratory failure with hypoxia and hypercapnia (HCC)   HTN (hypertension)   Type 2 diabetes mellitus (HCC)   AKI (acute kidney injury) (HCC)   Abdominal distention   Alcohol use     Acute COPD exacerbation Possible developing pneumonia Acute on chronic respiratory failure with hypoxemia and hypercapnia Off BiPap. Still quite a bit of DOE.  - BNP and procalcitonin negative. Resp Viral Panel - neg - Chest x-ray showing new patchy opacity - Discontinue broad-spectrum antibiotics, total 5 days of oral azithromycin - Continue steroids, bronchodilators scheduled and as needed, I-S/flutter valve.  Pulmicort twice daily  AKI Creatinine 1.6, baseline 1.0. IV fluids.  Holding Lasix and resume losartan.  Cr stable at 0.95   Abdominal distention X-ray appears to be normal   History of alcohol use Denies ongoing alcohol use.   Type 2 diabetes A1c 6.2 on 06/14/2022.  Sliding scale and Accu-Cheks.  Semglee 10 units daily added   Hypertension; uncontrolled Hold lasix, resume losartan Resume Toprol.  IV as needed  DVT prophylaxis: Lovenox Code Status: Full code Family Communication:   Status is: Inpatient Severe COPD exac, cont hosp stay       Diet Orders (From admission, onward)     Start     Ordered   07/13/22 0757  Diet Carb Modified Fluid consistency: Thin; Room service appropriate? Yes  Diet effective now       Question Answer Comment  Diet-HS Snack? Nothing   Calorie Level Medium  1600-2000   Fluid consistency: Thin   Room service appropriate? Yes      07/13/22 0756            Subjective: Doing slightly better Passing gas. Having BM. PO intake is ok   Examination:  General exam: Appears calm and comfortable ; 2L Learned Respiratory system: b/l exp wheezing. Diffuse.  Cardiovascular system: S1 & S2 heard, RRR. No JVD, murmurs, rubs, gallops or clicks. No pedal edema. Gastrointestinal system: Abdomen is nondistended, soft and nontender. No organomegaly or masses felt. Normal bowel sounds heard. Central nervous system: Alert and oriented. No focal neurological deficits. Extremities: Symmetric 5 x 5 power. Skin: No rashes, lesions or ulcers Psychiatry: Judgement and insight appear normal. Mood & affect appropriate.  Objective: Vitals:   07/13/22 2032 07/13/22 2358 07/14/22 0331 07/14/22 0731  BP: (!) 163/90 (!) 164/80 137/80   Pulse: 71 74 (!) 59   Resp: 18 (!) 21 14   Temp: 98 F (36.7 C) (!) 97.5 F (36.4 C) 97.7 F (36.5 C)   TempSrc: Oral Oral Oral   SpO2: 96% 95% 96% 100%  Weight:      Height:        Intake/Output Summary (Last 24 hours) at 07/14/2022 0756 Last data filed at 07/13/2022 2140 Gross per 24 hour  Intake 925.76 ml  Output 1242 ml  Net -316.24 ml   Filed Weights   07/12/22 0345 07/12/22 2200  Weight: 98.4 kg 96.3 kg    Scheduled Meds:  atorvastatin  10 mg Oral Daily   azithromycin  250 mg Oral Daily   budesonide (PULMICORT) nebulizer solution  0.5 mg Nebulization BID   enoxaparin (LOVENOX) injection  40 mg Subcutaneous Q24H   insulin aspart  0-15 Units Subcutaneous TID WC   insulin aspart  0-5 Units Subcutaneous QHS   insulin aspart  3 Units Subcutaneous TID WC   insulin glargine-yfgn  10 Units Subcutaneous Daily   ipratropium-albuterol  3 mL Nebulization BID   methylPREDNISolone (SOLU-MEDROL) injection  120 mg Intravenous Daily   metoprolol succinate  50 mg Oral Daily   pantoprazole  40 mg Oral Daily   pregabalin  100  mg Oral BID   Continuous Infusions:  sodium chloride 75 mL/hr at 07/13/22 1946   sodium phosphate 15 mmol in dextrose 5 % 250 mL infusion      Nutritional status     Body mass index is 30.9 kg/m.  Data Reviewed:   CBC: Recent Labs  Lab 07/12/22 0340 07/12/22 0411 07/14/22 0229  WBC 11.8*  --  13.4*  NEUTROABS 8.4*  --   --   HGB 11.4* 12.2* 10.6*  HCT 37.2* 36.0* 34.5*  MCV 97.1  --  95.3  PLT 328  --  323   Basic Metabolic Panel: Recent Labs  Lab 07/12/22 0340 07/12/22 0411 07/12/22 0540 07/14/22 0229  NA 136 135 135 135  K 4.5 4.4 4.6 4.9  CL 98  --  98 105  CO2 26  --  26 20*  GLUCOSE 129*  --  205* 230*  BUN 33*  --  33* 33*  CREATININE 1.68*  --  1.46* 0.95  CALCIUM 8.9  --  8.6* 8.2*  MG  --   --   --  2.4  PHOS  --   --   --  2.4*   GFR: Estimated Creatinine Clearance: 94.4 mL/min (by C-G formula based on SCr of 0.95 mg/dL). Liver Function Tests: No results for input(s): "AST", "ALT", "ALKPHOS", "BILITOT", "PROT", "ALBUMIN" in the last 168 hours. No results for input(s): "LIPASE", "AMYLASE" in the last 168 hours. No results for input(s): "AMMONIA" in the last 168 hours. Coagulation Profile: No results for input(s): "INR", "PROTIME" in the last 168 hours. Cardiac Enzymes: No results for input(s): "CKTOTAL", "CKMB", "CKMBINDEX", "TROPONINI" in the last 168 hours. BNP (last 3 results) No results for input(s): "PROBNP" in the last 8760 hours. HbA1C: No results for input(s): "HGBA1C" in the last 72 hours. CBG: Recent Labs  Lab 07/13/22 0452 07/13/22 0837 07/13/22 1411 07/13/22 1727 07/13/22 2124  GLUCAP 157* 115* 222* 177* 157*   Lipid Profile: No results for input(s): "CHOL", "HDL", "LDLCALC", "TRIG", "CHOLHDL", "LDLDIRECT" in the last 72 hours. Thyroid Function Tests: No results for input(s): "TSH", "T4TOTAL", "FREET4", "T3FREE", "THYROIDAB" in the last 72 hours. Anemia Panel: No results for input(s): "VITAMINB12", "FOLATE", "FERRITIN",  "TIBC", "IRON", "RETICCTPCT" in the last 72 hours. Sepsis Labs: Recent Labs  Lab 07/12/22 0540  PROCALCITON <0.10    Recent Results (from the past 240 hour(s))  Resp panel by RT-PCR (RSV, Flu A&B, Covid) Anterior Nasal Swab     Status: None   Collection Time: 07/12/22  3:40 AM   Specimen: Anterior Nasal Swab  Result Value Ref Range Status   SARS Coronavirus 2 by RT PCR NEGATIVE NEGATIVE Final   Influenza A by PCR NEGATIVE NEGATIVE Final   Influenza B by PCR NEGATIVE NEGATIVE Final    Comment: (NOTE) The Xpert Xpress SARS-CoV-2/FLU/RSV plus assay  is intended as an aid in the diagnosis of influenza from Nasopharyngeal swab specimens and should not be used as a sole basis for treatment. Nasal washings and aspirates are unacceptable for Xpert Xpress SARS-CoV-2/FLU/RSV testing.  Fact Sheet for Patients: BloggerCourse.com  Fact Sheet for Healthcare Providers: SeriousBroker.it  This test is not yet approved or cleared by the [place] FDA and has been authorized for detection and/or diagnosis of SARS-CoV-2 by FDA under an Emergency Use Authorization (EUA). This EUA will remain in effect (meaning this test can be used) for the duration of the COVID-19 declaration under Section 564(b)(1) of the Act, 21 U.S.C. section 360bbb-3(b)(1), unless the authorization is terminated or revoked.     Resp Syncytial Virus by PCR NEGATIVE NEGATIVE Final    Comment: (NOTE) Fact Sheet for Patients: BloggerCourse.com  Fact Sheet for Healthcare Providers: SeriousBroker.it  This test is not yet approved or cleared by the [place] FDA and has been authorized for detection and/or diagnosis of SARS-CoV-2 by FDA under an Emergency Use Authorization (EUA). This EUA will remain in effect (meaning this test can be used) for the duration of the COVID-19 declaration under Section 564(b)(1) of the  Act, 21 U.S.C. section 360bbb-3(b)(1), unless the authorization is terminated or revoked.  Performed at [facility], [address], [place], [place] [address]   MRSA Next Gen by PCR, Nasal     Status: None   Collection Time: 07/13/22 12:15 PM   Specimen: Nasal Mucosa; Nasal Swab  Result Value Ref Range Status   MRSA by PCR Next Gen NOT DETECTED NOT DETECTED Final    Comment: (NOTE) The GeneXpert MRSA Assay (FDA approved for NASAL specimens only), is one component of a comprehensive MRSA colonization surveillance program. It is not intended to diagnose MRSA infection nor to guide or monitor treatment for MRSA infections. Test performance is not FDA approved in patients less than 35 years old. Performed at [facility], [address] [place], [place] [address]   Respiratory (~20 pathogens) panel by PCR     Status: None   Collection Time: 07/13/22 12:15 PM   Specimen: Nasopharyngeal Swab; Respiratory  Result Value Ref Range Status   Adenovirus NOT DETECTED NOT DETECTED Final   Coronavirus 229E NOT DETECTED NOT DETECTED Final    Comment: (NOTE) The Coronavirus on the Respiratory Panel, DOES NOT test for the novel  Coronavirus (2019 nCoV)    Coronavirus HKU1 NOT DETECTED NOT DETECTED Final   Coronavirus NL63 NOT DETECTED NOT DETECTED Final   Coronavirus OC43 NOT DETECTED NOT DETECTED Final   Metapneumovirus NOT DETECTED NOT DETECTED Final   Rhinovirus / Enterovirus NOT DETECTED NOT DETECTED Final   Influenza A NOT DETECTED NOT DETECTED Final   Influenza B NOT DETECTED NOT DETECTED Final   Parainfluenza Virus 1 NOT DETECTED NOT DETECTED Final   Parainfluenza Virus 2 NOT DETECTED NOT DETECTED Final   Parainfluenza Virus 3 NOT DETECTED NOT DETECTED Final   Parainfluenza Virus 4 NOT DETECTED NOT DETECTED Final   Respiratory Syncytial Virus NOT DETECTED NOT DETECTED Final   Bordetella pertussis NOT DETECTED NOT DETECTED Final   Bordetella Parapertussis NOT DETECTED  NOT DETECTED Final   Chlamydophila pneumoniae NOT DETECTED NOT DETECTED Final   Mycoplasma pneumoniae NOT DETECTED NOT DETECTED Final    Comment: Performed at [facility], [address] [place], [place] [address]         Radiology Studies: DG Abd 1 View  Result Date: 07/13/2022  CLINICAL DATA:  Abdominal distension EXAM: ABDOMEN - 1 VIEW COMPARISON:  07/12/2022 FINDINGS: The bowel gas pattern is normal. No radio-opaque calculi or other significant radiographic abnormality are seen. Left base atelectasis. Possible small left effusion. IMPRESSION: No evidence of bowel obstruction or free air. Left base atelectasis with small left effusion. Electronically Signed   By: Charlett Nose M.D.   On: 07/13/2022 17:25           LOS: 2 days   Time spent= 35 mins    Dilcia Rybarczyk Joline Maxcy, MD [facility]  If 7PM-7AM, please contact night-coverage  07/14/2022, 7:56 AM

## 2022-07-15 DIAGNOSIS — J441 Chronic obstructive pulmonary disease with (acute) exacerbation: Secondary | ICD-10-CM | POA: Diagnosis not present

## 2022-07-15 LAB — BASIC METABOLIC PANEL
Anion gap: 5 (ref 5–15)
BUN: 29 mg/dL — ABNORMAL HIGH (ref 8–23)
CO2: 29 mmol/L (ref 22–32)
Calcium: 8.7 mg/dL — ABNORMAL LOW (ref 8.9–10.3)
Chloride: 108 mmol/L (ref 98–111)
Creatinine, Ser: 0.94 mg/dL (ref 0.61–1.24)
GFR, Estimated: >60 mL/min (ref >60)
Glucose, Bld: 135 mg/dL — ABNORMAL HIGH (ref 70–99)
Potassium: 4.2 mmol/L (ref 3.5–5.1)
Sodium: 142 mmol/L (ref 135–145)

## 2022-07-15 LAB — CBC
HCT: 37.5 % — ABNORMAL LOW (ref 39.0–52.0)
Hemoglobin: 11.3 g/dL — ABNORMAL LOW (ref 13.0–17.0)
MCH: 29 pg (ref 26.0–34.0)
MCHC: 30.1 g/dL (ref 30.0–36.0)
MCV: 96.4 fL (ref 80.0–100.0)
Platelets: 291 10*3/uL (ref 150–400)
RBC: 3.89 MIL/uL — ABNORMAL LOW (ref 4.22–5.81)
RDW: 14.8 % (ref 11.5–15.5)
WBC: 13.1 10*3/uL — ABNORMAL HIGH (ref 4.0–10.5)
nRBC: 0 % (ref 0.0–0.2)

## 2022-07-15 LAB — GLUCOSE, CAPILLARY
Glucose-Capillary: 109 mg/dL — ABNORMAL HIGH (ref 70–99)
Glucose-Capillary: 172 mg/dL — ABNORMAL HIGH (ref 70–99)
Glucose-Capillary: 177 mg/dL — ABNORMAL HIGH (ref 70–99)
Glucose-Capillary: 240 mg/dL — ABNORMAL HIGH (ref 70–99)

## 2022-07-15 LAB — MAGNESIUM: Magnesium: 2.6 mg/dL — ABNORMAL HIGH (ref 1.7–2.4)

## 2022-07-15 MED ORDER — NICOTINE 14 MG/24HR TD PT24
14.0000 mg | MEDICATED_PATCH | Freq: Every day | TRANSDERMAL | Status: DC
  Administered 2022-07-15 – 2022-07-16 (×2): 14 mg via TRANSDERMAL
  Filled 2022-07-15 (×2): qty 1

## 2022-07-15 NOTE — TOC CAGE-AID Note (Signed)
Transition of Care [facility]) - CAGE-AID Screening   Patient Details  Name: James [name] MRN: [id number] Date of Birth: 09/09/1960  [facility] CM/SW Contact:    Kermit Balo, RN Phone Number: 07/15/2022, 4:21 PM   Clinical Narrative: Pt denied the need for inpatient/ outpatient alcohol counseling.   CAGE-AID Screening:    Have You Ever Felt You Ought to Cut Down on Your Drinking or Drug Use?: No Have People Annoyed You By Critizing Your Drinking Or Drug Use?: No Have You Felt Bad Or Guilty About Your Drinking Or Drug Use?: No Have You Ever Had a Drink or Used Drugs First Thing In The Morning to Steady Your Nerves or to Get Rid of a Hangover?: No CAGE-AID Score: 0  Substance Abuse Education Offered: Yes (refused)

## 2022-07-15 NOTE — Plan of Care (Signed)
Problem: Education: Goal: Knowledge of disease or condition will improve Outcome: Progressing Goal: Knowledge of the prescribed therapeutic regimen will improve Outcome: Progressing Goal: Individualized Educational Video(s) Outcome: Progressing   Problem: Activity: Goal: Ability to tolerate increased activity will improve Outcome: Progressing Goal: Will verbalize the importance of balancing activity with adequate rest periods Outcome: Progressing   Problem: Respiratory: Goal: Ability to maintain a clear airway will improve Outcome: Progressing Goal: Levels of oxygenation will improve Outcome: Progressing Goal: Ability to maintain adequate ventilation will improve Outcome: Progressing   Problem: Education: Goal: Ability to describe self-care measures that may prevent or decrease complications (Diabetes Survival Skills Education) will improve Outcome: Progressing Goal: Individualized Educational Video(s) Outcome: Progressing   Problem: Coping: Goal: Ability to adjust to condition or change in health will improve Outcome: Progressing   Problem: Fluid Volume: Goal: Ability to maintain a balanced intake and output will improve Outcome: Progressing   Problem: Health Behavior/Discharge Planning: Goal: Ability to identify and utilize available resources and services will improve Outcome: Progressing Goal: Ability to manage health-related needs will improve Outcome: Progressing   Problem: Metabolic: Goal: Ability to maintain appropriate glucose levels will improve Outcome: Progressing   Problem: Nutritional: Goal: Maintenance of adequate nutrition will improve Outcome: Progressing Goal: Progress toward achieving an optimal weight will improve Outcome: Progressing   Problem: Skin Integrity: Goal: Risk for impaired skin integrity will decrease Outcome: Progressing   Problem: Tissue Perfusion: Goal: Adequacy of tissue perfusion will improve Outcome: Progressing    Problem: Education: Goal: Knowledge of General Education information will improve Description: Including pain rating scale, medication(s)/side effects and non-pharmacologic comfort measures Outcome: Progressing   Problem: Health Behavior/Discharge Planning: Goal: Ability to manage health-related needs will improve Outcome: Progressing   Problem: Clinical Measurements: Goal: Ability to maintain clinical measurements within normal limits will improve Outcome: Progressing Goal: Will remain free from infection Outcome: Progressing Goal: Diagnostic test results will improve Outcome: Progressing Goal: Respiratory complications will improve Outcome: Progressing Goal: Cardiovascular complication will be avoided Outcome: Progressing   Problem: Activity: Goal: Risk for activity intolerance will decrease Outcome: Progressing   Problem: Nutrition: Goal: Adequate nutrition will be maintained Outcome: Progressing   Problem: Coping: Goal: Level of anxiety will decrease Outcome: Progressing   Problem: Elimination: Goal: Will not experience complications related to bowel motility Outcome: Progressing Goal: Will not experience complications related to urinary retention Outcome: Progressing   Problem: Pain Managment: Goal: General experience of comfort will improve Outcome: Progressing   Problem: Safety: Goal: Ability to remain free from injury will improve Outcome: Progressing   Problem: Skin Integrity: Goal: Risk for impaired skin integrity will decrease Outcome: Progressing   Problem: Education: Goal: Knowledge of disease or condition will improve Outcome: Progressing   Problem: Education: Goal: Knowledge of the prescribed therapeutic regimen will improve Outcome: Progressing   Problem: Education: Goal: Individualized Educational Video(s) Outcome: Progressing   Problem: Activity: Goal: Will verbalize the importance of balancing activity with adequate rest  periods Outcome: Progressing   Problem: Respiratory: Goal: Ability to maintain a clear airway will improve Outcome: Progressing   Problem: Respiratory: Goal: Levels of oxygenation will improve Outcome: Progressing   Problem: Respiratory: Goal: Ability to maintain adequate ventilation will improve Outcome: Progressing   Problem: Education: Goal: Individualized Educational Video(s) Outcome: Progressing   Problem: Fluid Volume: Goal: Ability to maintain a balanced intake and output will improve Outcome: Progressing   Problem: Education: Goal: Individualized Educational Video(s) Outcome: Progressing   Problem: Health Behavior/Discharge Planning: Goal: Ability to manage health-related  needs will improve Outcome: Progressing   Problem: Metabolic: Goal: Ability to maintain appropriate glucose levels will improve Outcome: Progressing   Problem: Skin Integrity: Goal: Risk for impaired skin integrity will decrease Outcome: Progressing   Problem: Nutritional: Goal: Progress toward achieving an optimal weight will improve Outcome: Progressing   Problem: Health Behavior/Discharge Planning: Goal: Ability to manage health-related needs will improve Outcome: Progressing   Problem: Education: Goal: Knowledge of General Education information will improve Description: Including pain rating scale, medication(s)/side effects and non-pharmacologic comfort measures Outcome: Progressing   Problem: Tissue Perfusion: Goal: Adequacy of tissue perfusion will improve Outcome: Progressing   Problem: Clinical Measurements: Goal: Cardiovascular complication will be avoided Outcome: Progressing   Problem: Clinical Measurements: Goal: Respiratory complications will improve Outcome: Progressing

## 2022-07-15 NOTE — Evaluation (Signed)
Pt has rested quietly during the night. Medicated once for CIWA with ativan with good results. Denies pain. No seizure activity noted.

## 2022-07-15 NOTE — Progress Notes (Signed)
PROGRESS NOTE    James [name]  ZOX:[id number] DOB: 01-Oct-1960 DOA: 07/12/2022 PCP: [name], MD   Brief Narrative:  62 year old with history of COPD, chronic hypoxia on 4 L nasal cannula, recurrent pneumonia, drinks 3-4 beers at home daily, HTN, GERD, DM2 comes to the hospital with complaints of shortness of breath.  Patient was found to be in COPD exacerbation   Assessment & Plan:  Principal Problem:   COPD with acute exacerbation (HCC) Active Problems:   Acute on chronic respiratory failure with hypoxia and hypercapnia (HCC)   HTN (hypertension)   Type 2 diabetes mellitus (HCC)   AKI (acute kidney injury) (HCC)   Abdominal distention   Alcohol use     Acute COPD exacerbation Possible developing pneumonia Acute on chronic respiratory failure with hypoxemia and hypercapnia Off BiPAP still with dyspnea on exertion.  Abnormal breath sounds. - BNP and procalcitonin negative. Resp Viral Panel - neg - Chest x-ray showing new patchy opacity - Discontinue broad-spectrum antibiotics, total 5 days of oral azithromycin - Continue steroids, bronchodilators scheduled and as needed, I-S/flutter valve.  Pulmicort twice daily  AKI Creatinine 1.6, baseline 1.0. IV fluids.  Holding Lasix and resume losartan.  Cr stable at 0.95   Abdominal distention X-ray appears to be normal   History of alcohol use Denies ongoing alcohol use.  Alcohol withdrawal protocol   Type 2 diabetes A1c 6.2 on 06/14/2022.  Sliding scale and Accu-Cheks.  Semglee 10 units daily added   Hypertension; uncontrolled Hold lasix, resume losartan Resume Toprol.  IV as needed  DVT prophylaxis: Lovenox Code Status: Full code Family Communication:   Status is: Inpatient Still has abnormal breath sounds.  Slow to improve     Diet Orders (From admission, onward)     Start     Ordered   07/13/22 0757  Diet Carb Modified Fluid consistency: Thin; Room service appropriate? Yes  Diet effective now        Question Answer Comment  Diet-HS Snack? Nothing   Calorie Level Medium 1600-2000   Fluid consistency: Thin   Room service appropriate? Yes      07/13/22 0756            Subjective: Still has some coughing and exertional SOB  Examination:  Constitutional: Not in acute distress Respiratory: diffuse diminished BS Cardiovascular: Normal sinus rhythm, no rubs Abdomen: Nontender nondistended good bowel sounds Musculoskeletal: No edema noted Skin: No rashes seen Neurologic: CN 2-12 grossly intact.  And nonfocal Psychiatric: Normal judgment and insight. Alert and oriented x 3. Normal mood.    Objective: Vitals:   07/14/22 1954 07/14/22 2034 07/14/22 2315 07/15/22 0255  BP: 133/73  126/70 (!) 152/94  Pulse:   72 72  Resp:    16  Temp: (!) 97.5 F (36.4 C)  98.2 F (36.8 C) (!) 96.4 F (35.8 C)  TempSrc: Oral  Oral Oral  SpO2:  95% 93% 96%  Weight:      Height:        Intake/Output Summary (Last 24 hours) at 07/15/2022 0810 Last data filed at 07/15/2022 0600 Gross per 24 hour  Intake 1200 ml  Output 2250 ml  Net -1050 ml   Filed Weights   07/12/22 0345 07/12/22 2200  Weight: 98.4 kg 96.3 kg    Scheduled Meds:  atorvastatin  10 mg Oral Daily   azithromycin  250 mg Oral Daily   budesonide (PULMICORT) nebulizer solution  0.5 mg Nebulization BID   enoxaparin (LOVENOX) injection  40 mg Subcutaneous Q24H   folic acid  1 mg Oral Daily   guaiFENesin  600 mg Oral BID   insulin aspart  0-15 Units Subcutaneous TID WC   insulin aspart  0-5 Units Subcutaneous QHS   insulin aspart  3 Units Subcutaneous TID WC   insulin glargine-yfgn  10 Units Subcutaneous Daily   ipratropium-albuterol  3 mL Nebulization BID   losartan  100 mg Oral Daily   methylPREDNISolone (SOLU-MEDROL) injection  120 mg Intravenous Daily   metoprolol succinate  50 mg Oral Daily   multivitamin with minerals  1 tablet Oral Daily   pantoprazole  40 mg Oral Daily   pregabalin  100 mg Oral BID    thiamine  100 mg Oral Daily   Or   thiamine  100 mg Intravenous Daily   Continuous Infusions:    Nutritional status     Body mass index is 30.9 kg/m.  Data Reviewed:   CBC: Recent Labs  Lab 07/12/22 0340 07/12/22 0411 07/14/22 0229 07/15/22 0719  WBC 11.8*  --  13.4* 13.1*  NEUTROABS 8.4*  --   --   --   HGB 11.4* 12.2* 10.6* 11.3*  HCT 37.2* 36.0* 34.5* 37.5*  MCV 97.1  --  95.3 96.4  PLT 328  --  323 291   Basic Metabolic Panel: Recent Labs  Lab 07/12/22 0340 07/12/22 0411 07/12/22 0540 07/14/22 0229  NA 136 135 135 135  K 4.5 4.4 4.6 4.9  CL 98  --  98 105  CO2 26  --  26 20*  GLUCOSE 129*  --  205* 230*  BUN 33*  --  33* 33*  CREATININE 1.68*  --  1.46* 0.95  CALCIUM 8.9  --  8.6* 8.2*  MG  --   --   --  2.4  PHOS  --   --   --  2.4*   GFR: Estimated Creatinine Clearance: 94.4 mL/min (by C-G formula based on SCr of 0.95 mg/dL). Liver Function Tests: No results for input(s): "AST", "ALT", "ALKPHOS", "BILITOT", "PROT", "ALBUMIN" in the last 168 hours. No results for input(s): "LIPASE", "AMYLASE" in the last 168 hours. No results for input(s): "AMMONIA" in the last 168 hours. Coagulation Profile: No results for input(s): "INR", "PROTIME" in the last 168 hours. Cardiac Enzymes: No results for input(s): "CKTOTAL", "CKMB", "CKMBINDEX", "TROPONINI" in the last 168 hours. BNP (last 3 results) No results for input(s): "PROBNP" in the last 8760 hours. HbA1C: No results for input(s): "HGBA1C" in the last 72 hours. CBG: Recent Labs  Lab 07/13/22 2124 07/14/22 0757 07/14/22 1239 07/14/22 1718 07/14/22 2313  GLUCAP 157* 99 165* 200* 208*   Lipid Profile: No results for input(s): "CHOL", "HDL", "LDLCALC", "TRIG", "CHOLHDL", "LDLDIRECT" in the last 72 hours. Thyroid Function Tests: No results for input(s): "TSH", "T4TOTAL", "FREET4", "T3FREE", "THYROIDAB" in the last 72 hours. Anemia Panel: No results for input(s): "VITAMINB12", "FOLATE", "FERRITIN",  "TIBC", "IRON", "RETICCTPCT" in the last 72 hours. Sepsis Labs: Recent Labs  Lab 07/12/22 0540  PROCALCITON <0.10    Recent Results (from the past 240 hour(s))  Resp panel by RT-PCR (RSV, Flu A&B, Covid) Anterior Nasal Swab     Status: None   Collection Time: 07/12/22  3:40 AM   Specimen: Anterior Nasal Swab  Result Value Ref Range Status   SARS Coronavirus 2 by RT PCR NEGATIVE NEGATIVE Final   Influenza A by PCR NEGATIVE NEGATIVE Final   Influenza B by PCR NEGATIVE NEGATIVE Final  Comment: (NOTE) The Xpert Xpress SARS-CoV-2/FLU/RSV plus assay is intended as an aid in the diagnosis of influenza from Nasopharyngeal swab specimens and should not be used as a sole basis for treatment. Nasal washings and aspirates are unacceptable for Xpert Xpress SARS-CoV-2/FLU/RSV testing.  Fact Sheet for Patients: BloggerCourse.com  Fact Sheet for Healthcare Providers: SeriousBroker.it  This test is not yet approved or cleared by the [place] FDA and has been authorized for detection and/or diagnosis of SARS-CoV-2 by FDA under an Emergency Use Authorization (EUA). This EUA will remain in effect (meaning this test can be used) for the duration of the COVID-19 declaration under Section 564(b)(1) of the Act, 21 U.S.C. section 360bbb-3(b)(1), unless the authorization is terminated or revoked.     Resp Syncytial Virus by PCR NEGATIVE NEGATIVE Final    Comment: (NOTE) Fact Sheet for Patients: BloggerCourse.com  Fact Sheet for Healthcare Providers: SeriousBroker.it  This test is not yet approved or cleared by the [place] FDA and has been authorized for detection and/or diagnosis of SARS-CoV-2 by FDA under an Emergency Use Authorization (EUA). This EUA will remain in effect (meaning this test can be used) for the duration of the COVID-19 declaration under Section 564(b)(1) of the  Act, 21 U.S.C. section 360bbb-3(b)(1), unless the authorization is terminated or revoked.  Performed at [facility], [address] [place], [place] [address]   MRSA Next Gen by PCR, Nasal     Status: None   Collection Time: 07/13/22 12:15 PM   Specimen: Nasal Mucosa; Nasal Swab  Result Value Ref Range Status   MRSA by PCR Next Gen NOT DETECTED NOT DETECTED Final    Comment: (NOTE) The GeneXpert MRSA Assay (FDA approved for NASAL specimens only), is one component of a comprehensive MRSA colonization surveillance program. It is not intended to diagnose MRSA infection nor to guide or monitor treatment for MRSA infections. Test performance is not FDA approved in patients less than 53 years old. Performed at [facility], [address] [place], [place] [address]   Respiratory (~20 pathogens) panel by PCR     Status: None   Collection Time: 07/13/22 12:15 PM   Specimen: Nasopharyngeal Swab; Respiratory  Result Value Ref Range Status   Adenovirus NOT DETECTED NOT DETECTED Final   Coronavirus 229E NOT DETECTED NOT DETECTED Final    Comment: (NOTE) The Coronavirus on the Respiratory Panel, DOES NOT test for the novel  Coronavirus (2019 nCoV)    Coronavirus HKU1 NOT DETECTED NOT DETECTED Final   Coronavirus NL63 NOT DETECTED NOT DETECTED Final   Coronavirus OC43 NOT DETECTED NOT DETECTED Final   Metapneumovirus NOT DETECTED NOT DETECTED Final   Rhinovirus / Enterovirus NOT DETECTED NOT DETECTED Final   Influenza A NOT DETECTED NOT DETECTED Final   Influenza B NOT DETECTED NOT DETECTED Final   Parainfluenza Virus 1 NOT DETECTED NOT DETECTED Final   Parainfluenza Virus 2 NOT DETECTED NOT DETECTED Final   Parainfluenza Virus 3 NOT DETECTED NOT DETECTED Final   Parainfluenza Virus 4 NOT DETECTED NOT DETECTED Final   Respiratory Syncytial Virus NOT DETECTED NOT DETECTED Final   Bordetella pertussis NOT DETECTED NOT DETECTED Final   Bordetella Parapertussis NOT DETECTED  NOT DETECTED Final   Chlamydophila pneumoniae NOT DETECTED NOT DETECTED Final   Mycoplasma pneumoniae NOT DETECTED NOT DETECTED Final    Comment: Performed at [facility], [address], [place], [place] [address]         Radiology Studies:  DG Abd 1 View  Result Date: 07/13/2022 CLINICAL DATA:  Abdominal distension EXAM: ABDOMEN - 1 VIEW COMPARISON:  07/12/2022 FINDINGS: The bowel gas pattern is normal. No radio-opaque calculi or other significant radiographic abnormality are seen. Left base atelectasis. Possible small left effusion. IMPRESSION: No evidence of bowel obstruction or free air. Left base atelectasis with small left effusion. Electronically Signed   By: Charlett Nose M.D.   On: 07/13/2022 17:25           LOS: 3 days   Time spent= 35 mins    James Grays Joline Maxcy, MD [facility]  If 7PM-7AM, please contact night-coverage  07/15/2022, 8:10 AM

## 2022-07-15 NOTE — Progress Notes (Signed)
[facility] night cross cover note:   I was notified by RN of the patient's request for nicotine patch.  He conveys that he does not smoke cigarettes, but rather uses chewing tobacco.  I subsequently placed order for daily nicotine patch.     Newton Pigg, DO Hospitalist

## 2022-07-15 NOTE — Plan of Care (Signed)

## 2022-07-16 ENCOUNTER — Other Ambulatory Visit: Payer: Self-pay

## 2022-07-16 ENCOUNTER — Encounter: Payer: Self-pay | Admitting: Pharmacist

## 2022-07-16 ENCOUNTER — Telehealth: Payer: Self-pay | Admitting: *Deleted

## 2022-07-16 ENCOUNTER — Other Ambulatory Visit (HOSPITAL_COMMUNITY): Payer: Self-pay

## 2022-07-16 DIAGNOSIS — J441 Chronic obstructive pulmonary disease with (acute) exacerbation: Secondary | ICD-10-CM | POA: Diagnosis not present

## 2022-07-16 LAB — BASIC METABOLIC PANEL
Anion gap: 4 — ABNORMAL LOW (ref 5–15)
BUN: 29 mg/dL — ABNORMAL HIGH (ref 8–23)
CO2: 31 mmol/L (ref 22–32)
Calcium: 8.6 mg/dL — ABNORMAL LOW (ref 8.9–10.3)
Chloride: 104 mmol/L (ref 98–111)
Creatinine, Ser: 0.87 mg/dL (ref 0.61–1.24)
GFR, Estimated: >60 mL/min (ref >60)
Glucose, Bld: 99 mg/dL (ref 70–99)
Potassium: 4.6 mmol/L (ref 3.5–5.1)
Sodium: 139 mmol/L (ref 135–145)

## 2022-07-16 LAB — GLUCOSE, CAPILLARY
Glucose-Capillary: 81 mg/dL (ref 70–99)
Glucose-Capillary: 134 mg/dL — ABNORMAL HIGH (ref 70–99)

## 2022-07-16 LAB — CBC
HCT: 37.1 % — ABNORMAL LOW (ref 39.0–52.0)
Hemoglobin: 11.4 g/dL — ABNORMAL LOW (ref 13.0–17.0)
MCH: 28.9 pg (ref 26.0–34.0)
MCHC: 30.7 g/dL (ref 30.0–36.0)
MCV: 94.2 fL (ref 80.0–100.0)
Platelets: 299 10*3/uL (ref 150–400)
RBC: 3.94 MIL/uL — ABNORMAL LOW (ref 4.22–5.81)
RDW: 14.5 % (ref 11.5–15.5)
WBC: 14.3 10*3/uL — ABNORMAL HIGH (ref 4.0–10.5)
nRBC: 0.1 % (ref 0.0–0.2)

## 2022-07-16 LAB — MAGNESIUM: Magnesium: 2.5 mg/dL — ABNORMAL HIGH (ref 1.7–2.4)

## 2022-07-16 MED ORDER — PREDNISONE 10 MG PO TABS
ORAL_TABLET | ORAL | 0 refills | Status: AC
  Filled 2022-07-16: qty 48, 15d supply, fill #0
  Filled 2022-07-16: qty 48, fill #0

## 2022-07-16 MED ORDER — ADULT MULTIVITAMIN W/MINERALS CH
1.0000 | ORAL_TABLET | Freq: Every day | ORAL | 0 refills | Status: AC
  Filled 2022-07-16 – 2022-12-01 (×2): qty 30, 30d supply, fill #0

## 2022-07-16 MED ORDER — FOLIC ACID 1 MG PO TABS
1.0000 mg | ORAL_TABLET | Freq: Every day | ORAL | 0 refills | Status: DC
Start: 2022-07-17 — End: 2022-08-16
  Filled 2022-07-16 – 2022-07-19 (×2): qty 30, 30d supply, fill #0

## 2022-07-16 MED ORDER — NICOTINE 14 MG/24HR TD PT24
14.0000 mg | MEDICATED_PATCH | Freq: Every day | TRANSDERMAL | 0 refills | Status: DC
  Filled 2022-07-16 – 2022-07-19 (×3): qty 28, 28d supply, fill #0

## 2022-07-16 MED ORDER — VITAMIN B-1 100 MG PO TABS
100.0000 mg | ORAL_TABLET | Freq: Every day | ORAL | 0 refills | Status: AC
  Filled 2022-07-16: qty 100, 100d supply, fill #0

## 2022-07-16 MED ORDER — AZITHROMYCIN 250 MG PO TABS
250.0000 mg | ORAL_TABLET | Freq: Every day | ORAL | 0 refills | Status: AC
  Filled 2022-07-16 – 2022-07-19 (×2): qty 3, 3d supply, fill #0

## 2022-07-16 NOTE — Discharge Summary (Signed)
Physician Discharge Summary  Patient ID: James [name] MRN: [id number] DOB/AGE: 1960/04/18 62 y.o.  Admit date: 07/12/2022 Discharge date: 07/16/2022  Admission Diagnoses:  Discharge Diagnoses:  Principal Problem:   COPD with acute exacerbation (HCC) Active Problems:   Acute on chronic respiratory failure with hypoxia and hypercapnia (HCC)   HTN (hypertension)   Type 2 diabetes mellitus (HCC)   AKI (acute kidney injury) (HCC)   Abdominal distention   Alcohol use   Discharged Condition: stable  Hospital Course: Patient is a 62 year old male with history of COPD, chronic hypoxia on 4 L of nasal cannula, recurrent pneumonia, hypertension, GERD, type 2 diabetes mellitus and history of alcohol use.  Apparently, patient drinks about 3-4 beers daily.  Patient was admitted with COPD with exacerbation.  Patient was admitted and managed with IV steroids and nebulizer treatment.  Patient has improved.  Patient is back to baseline.  Patient will be discharged back home today to the care of the primary care provider.  Acute COPD exacerbation Possible developing pneumonia Acute on chronic respiratory failure with hypoxemia and hypercapnia: -Patient was admitted and managed with BiPAP, steroids, nebulizer treatment and antibiotics. - BNP and procalcitonin negative. Resp Viral Panel - neg - Chest x-ray showing new patchy opacity - Patient has completed course of antibiotics. -Patient has improved significantly.  Patient is back to his baseline.  Patient be discharged back home to the care of the primary care provider.      AKI Creatinine 1.6, baseline 1.0. IV fluids.  Holding Lasix and resume losartan.  Cr stable at 0.95   Abdominal distention X-ray appears to be normal   History of alcohol use Denies ongoing alcohol use.  Alcohol withdrawal protocol   Type 2 diabetes A1c 6.2 on 06/14/2022.  Sliding scale and Accu-Cheks.  Semglee 10 units daily added   Hypertension; uncontrolled Hold  lasix, resume losartan Resume Toprol.  IV as needed  Consults: None  Significant Diagnostic Studies:    Discharge Exam: Blood pressure (!) 144/86, pulse 71, temperature 97.6 F (36.4 C), temperature source Oral, resp. rate 19, height 5' 9.5" (1.765 m), weight 96.3 kg, SpO2 95%.   Disposition: Discharge disposition: 01-Home or Self Care       Discharge Instructions     Diet - low sodium heart healthy   Complete by: As directed    Increase activity slowly   Complete by: As directed       Allergies as of 07/16/2022   No Known Allergies      Medication List     TAKE these medications    albuterol (2.5 MG/3ML) 0.083% nebulizer solution Commonly known as: PROVENTIL Take 3 mLs (2.5 mg total) by nebulization every 4 (four) hours as needed for wheezing or shortness of breath.   Ventolin HFA 108 (90 Base) MCG/ACT inhaler Generic drug: albuterol Inhale 2 puffs into the lungs every 6 (six) hours as needed for wheezing or shortness of breath.   atorvastatin 10 MG tablet Commonly known as: LIPITOR Take 1 tablet (10 mg total) by mouth daily.   azithromycin 250 MG tablet Commonly known as: ZITHROMAX Take 1 tablet (250 mg total) by mouth daily for 3 days. Start taking on: July 17, 2022   Breztri Aerosphere 160-9-4.8 MCG/ACT Aero Generic drug: Budeson-Glycopyrrol-Formoterol Inhale 2 puffs into the lungs in the morning and at bedtime.   folic acid 1 MG tablet Commonly known as: FOLVITE Take 1 tablet (1 mg total) by mouth daily. Start taking on: July 17, 2022  FreeStyle Libre 2 Reader [place] Use to check blood sugar continuously throughout the day.   FreeStyle Libre 2 Sensor Misc Use to check blood sugar continuously throughout the day.   furosemide 40 MG tablet Commonly known as: LASIX Take 2 tablets (80 mg total) by mouth daily for 4 days, THEN 1 tablet (40 mg total) daily. Start taking on: June 29, 2022 What changed: See the new instructions.   glipiZIDE 5 MG  24 hr tablet Commonly known as: GLUCOTROL XL Take 1 tablet (5 mg total) by mouth daily with breakfast.   losartan 100 MG tablet Commonly known as: COZAAR Take 1 tablet (100 mg total) by mouth daily.   metoprolol succinate 50 MG 24 hr tablet Commonly known as: TOPROL-XL Take 1 tablet (50 mg total) by mouth daily. Take with or immediately following a meal.   multivitamin with minerals Tabs tablet Take 1 tablet by mouth daily. Start taking on: July 17, 2022   nicotine 14 mg/24hr patch Commonly known as: NICODERM CQ - dosed in mg/24 hours Place 1 patch (14 mg total) onto the skin daily. Start taking on: July 17, 2022   nystatin cream Commonly known as: MYCOSTATIN Apply to affected areas 3 (three) times daily.   pantoprazole 40 MG tablet Commonly known as: PROTONIX Take 1 tablet (40 mg total) by mouth daily.   predniSONE 10 MG tablet Commonly known as: DELTASONE Prednisone 60 Mg p.o. once daily for 3 days, then 40 Mg p.o. once daily for 3 days, then 30 Mg p.o. once daily for 3 days, then 20 Mg p.o. once daily for 3 days, then 10 Mg p.o. once daily for 3 days and stop. What changed: See the new instructions.   pregabalin 100 MG capsule Commonly known as: Lyrica Take 1 capsule (100 mg total) by mouth 2 (two) times daily.   thiamine 100 MG tablet Commonly known as: Vitamin B-1 Take 1 tablet (100 mg total) by mouth daily. Start taking on: July 17, 2022       Time spent: 16 Minutes.  SignedBarnetta Chapel 07/16/2022, 12:53 PM

## 2022-07-16 NOTE — Transitions of Care (Post Inpatient/ED Visit) (Signed)
07/16/2022  Name: James [name] MRN: [id number] DOB: 21-Sep-1960  Today's TOC FU Call Status: Today's TOC FU Call Status:: Successful TOC FU Call Competed TOC FU Call Complete Date: 07/16/22  Transition Care Management Follow-up Telephone Call Date of Discharge: 07/16/22 Discharge Facility: [facility]) Type of Discharge: Inpatient Admission Primary Inpatient Discharge Diagnosis:: COPD exacerbation How have you been since you were released from the hospital?: Same Any questions or concerns?: No  Items Reviewed: Did you receive and understand the discharge instructions provided?: Yes Medications obtained,verified, and reconciled?: Partial Review Completed Reason for Partial Mediation Review: Patient is working to find a ride to the pharmacy to pick up newly prescribed medications Any new allergies since your discharge?: No Dietary orders reviewed?: NA Do you have support at home?: No  Medications Reviewed Today: Medications Reviewed Today     Reviewed by [name], RN (Registered Nurse) on 07/16/22 at 1612  Med List Status: <None>   Medication Order Taking? Sig Documenting Provider Last Dose Status Informant  albuterol (PROVENTIL) (2.5 MG/3ML) 0.083% nebulizer solution [id number] Yes Take 3 mLs (2.5 mg total) by nebulization every 4 (four) hours as needed for wheezing or shortness of breath. [name], MD Taking Active Self, Pharmacy Records  albuterol (VENTOLIN HFA) 108 [phone] Base) MCG/ACT inhaler [id number] Yes Inhale 2 puffs into the lungs every 6 (six) hours as needed for wheezing or shortness of breath. [name], MD Taking Active Self, Pharmacy Records           Med Note (COFFELL, Marzella Schlein   Mon Jun 14, 2022 11:29 AM)    atorvastatin (LIPITOR) 10 MG tablet [id number] Yes Take 1 tablet (10 mg total) by mouth daily. [name], MD Taking Active Self, Pharmacy Records           Med Note Epimenio Sarin, Carlota Raspberry Jul 12, 2022  7:53 AM)    azithromycin  (ZITHROMAX) 250 MG tablet [id number]  Take 1 tablet (250 mg total) by mouth daily for 3 days. Barnetta Chapel, MD  Active            Med Note [name], Aidin Doane A   Fri Jul 16, 2022  4:12 PM) Plans to pick up today  Budeson-Glycopyrrol-Formoterol (BREZTRI AEROSPHERE) 160-9-4.8 MCG/ACT AERO [id number] Yes Inhale 2 puffs into the lungs in the morning and at bedtime. Omar Person, MD Taking Active Pharmacy Records, Self           Med Note Epimenio Sarin, Carlota Raspberry Jul 12, 2022  7:53 AM)    Continuous Glucose Receiver (FREESTYLE LIBRE 2 READER) [place] [id number] No Use to check blood sugar continuously throughout the day.  Patient not taking: Reported on 07/16/2022   [name], MD Not Taking Active Self, Pharmacy Records  Continuous Glucose Sensor (FREESTYLE [place] 2 SENSOR) [place] [id number] No Use to check blood sugar continuously throughout the day.  Patient not taking: Reported on 07/16/2022   [name], MD Not Taking Active Self, Pharmacy Records  folic acid (FOLVITE) 1 MG tablet [id number] Yes Take 1 tablet (1 mg total) by mouth daily. Barnetta Chapel, MD Taking Active   furosemide (LASIX) 40 MG tablet [id number] Yes Take 2 tablets (80 mg total) by mouth daily for 4 days, THEN 1 tablet (40 mg total) daily.  Patient taking differently: 40 mg once daily.   [name], MD Taking Active Self, Pharmacy Records  glipiZIDE (GLUCOTROL XL) 5 MG 24 hr  tablet [id number] Yes Take 1 tablet (5 mg total) by mouth daily with breakfast. [name], MD Taking Active Self, Pharmacy Records  losartan (COZAAR) 100 MG tablet [id number] Yes Take 1 tablet (100 mg total) by mouth daily. [name], MD Taking Active Self, Pharmacy Records  metoprolol succinate (TOPROL-XL) 50 MG 24 hr tablet [id number] Yes Take 1 tablet (50 mg total) by mouth daily. Take with or immediately following a meal. [name], MD Taking Active Self, Pharmacy Records  Multiple Vitamin (MULTIVITAMIN WITH  MINERALS) TABS tablet [id number] Yes Take 1 tablet by mouth daily. Barnetta Chapel, MD Taking Active   nicotine (NICODERM CQ - DOSED IN MG/24 HOURS) 14 mg/24hr patch [id number] Yes Place 1 patch (14 mg total) onto the skin daily. Barnetta Chapel, MD Taking Active   nystatin cream (MYCOSTATIN) [id number] Yes Apply to affected areas 3 (three) times daily. [name], MD Taking Active Self, Pharmacy Records           Med Note Epimenio Sarin, Carlota Raspberry Jul 12, 2022  7:53 AM)    pantoprazole (PROTONIX) 40 MG tablet [id number] Yes Take 1 tablet (40 mg total) by mouth daily. [name], MD Taking Active Self, Pharmacy Records  predniSONE (DELTASONE) 10 MG tablet [id number]  Take 6 tablets (60 mg total) by mouth daily for 3 days, THEN 4 tablets (40 mg total) daily for 3 days, THEN 3 tablets (30 mg total) daily for 3 days, THEN 2 tablets (20 mg total) daily for 3 days, THEN 1 tablet (10 mg total) daily for 3 days. Barnetta Chapel, MD  Active            Med Note [name], Isai Gottlieb A   Fri Jul 16, 2022  4:12 PM) Plans to pick up today  pregabalin (LYRICA) 100 MG capsule [id number] Yes Take 1 capsule (100 mg total) by mouth 2 (two) times daily. [name], MD Taking Active Self, Pharmacy Records  thiamine (VITAMIN B-1) 100 MG tablet [id number] Yes Take 1 tablet (100 mg total) by mouth daily. Barnetta Chapel, MD Taking Active             Home Care and Equipment/Supplies: Were Home Health Services Ordered?: NA Any new equipment or medical supplies ordered?: NA  Functional Questionnaire: Do you need assistance with bathing/showering or dressing?: No Do you need assistance with meal preparation?: No Do you need assistance with eating?: No Do you have difficulty maintaining continence: No Do you need assistance with getting out of bed/getting out of a chair/moving?: No Do you have difficulty managing or taking your medications?: No  Follow up appointments reviewed: PCP Follow-up  appointment confirmed?: No (Patient will call PCP office and schedule follow up) MD Provider Line Number:[phone] Given: No Specialist Hospital Follow-up appointment confirmed?: NA Do you need transportation to your follow-up appointment?: Yes Transportation Need Intervention Addressed By:: Other: (RNCM will assist with arranging once appointment is made) Do you understand care options if your condition(s) worsen?: Yes-patient verbalized understanding  SDOH Interventions Today    Flowsheet Row Most Recent Value  SDOH Interventions   Transportation Interventions Payor Benefit       [name] RN, BSN [place]  Managed [facility] RN Care Coordinator [phone]

## 2022-07-16 NOTE — Progress Notes (Signed)
Discharge instructions and medication changes reviewed with patient, patient verbalizes understanding, patient to be escorted via wheelchair on 2L O2 via nasal cannula, IV removed catheter tip intact, pt tolerated well.

## 2022-07-16 NOTE — TOC Transition Note (Signed)
  Transition of Care [facility]) - CM/SW Discharge Note   Patient Details  Name: James [name] MRN: [id number] Date of Birth: [birth year]  Transition [facility] Care [facility]) CM/SW Contact:  Gordy Clement, RN Phone Number: 07/16/2022, 1:16 PM   Clinical Narrative:    Patient will dc to home where he lives alone. A portable O2 tank has been delivered to room by [employer].  Patient will require cab voucher- CM has given one to  patients RN. There are no recommendations for therapy . Patient will follow up as instructed on AVS.        Barriers to Discharge: Continued Medical Work up   Patient Goals and CMS Choice      Discharge Placement                         Discharge Plan and Services Additional resources added to the After Visit Summary for     Discharge Planning Services: CM Consult            DME Arranged: N/A                    Social Determinants of Health (SDOH) Interventions SDOH Screenings   Food Insecurity: No Food Insecurity (07/12/2022)  Housing: Low Risk  (07/12/2022)  Transportation Needs: No Transportation Needs (07/12/2022)  Recent Concern: Transportation Needs - Unmet Transportation Needs (06/30/2022)  Utilities: Not At Risk (07/12/2022)  Alcohol Screen: Low Risk  (09/09/2021)  Depression (PHQ2-9): Medium Risk (12/29/2021)  Financial Resource Strain: Low Risk  (12/25/2020)  Recent Concern: Financial Resource Strain - High Risk (10/29/2020)  Physical Activity: Inactive (12/11/2020)  Social Connections: Moderately Integrated (11/13/2020)  Stress: No Stress Concern Present (12/25/2020)  Tobacco Use: Medium Risk (07/12/2022)     Readmission Risk Interventions    02/04/2021   12:44 PM 08/14/2020    9:59 AM 04/24/2020    1:03 PM  Readmission Risk Prevention Plan  Post Dischage Appt Complete    Medication Screening Complete    Transportation Screening Complete Complete Complete  PCP or Specialist Appt within 3-5 Days  Complete   Not Complete comments  patient  already has an appointment schedules with Dr Delford Field   [facility] or Home Care Consult  Complete   Social Work Consult for Recovery Care Planning/Counseling  Complete   Palliative Care Screening  Not Applicable   Medication Review (RN Care Manager)  Complete   PCP or Specialist appointment within 3-5 days of discharge   Not Complete  PCP/Specialist Appt Not Complete comments   Pt had existing appt 5/23--this is first available per [facility]  [facility] or Home Care Consult   Complete  SW Recovery Care/Counseling Consult   Complete  Palliative Care Screening   Complete  Skilled Nursing Facility   Not Applicable

## 2022-07-19 ENCOUNTER — Other Ambulatory Visit: Payer: Self-pay

## 2022-07-19 ENCOUNTER — Other Ambulatory Visit (HOSPITAL_BASED_OUTPATIENT_CLINIC_OR_DEPARTMENT_OTHER): Payer: Self-pay

## 2022-07-21 ENCOUNTER — Other Ambulatory Visit: Payer: Self-pay

## 2022-07-21 ENCOUNTER — Telehealth: Payer: Self-pay

## 2022-07-21 ENCOUNTER — Other Ambulatory Visit (HOSPITAL_COMMUNITY): Payer: Self-pay

## 2022-07-21 ENCOUNTER — Other Ambulatory Visit: Payer: Medicaid Other | Admitting: *Deleted

## 2022-07-21 ENCOUNTER — Encounter: Payer: Self-pay | Admitting: *Deleted

## 2022-07-21 NOTE — Patient Outreach (Signed)
Medicaid Managed Care   Nurse Care Manager Note  07/21/2022 Name:  James [name] MRN:  [id number] DOB:  04/11/1960  James [name] is an 62 y.o. year old male who is a primary patient of Delford Field Charlcie Cradle, MD.  [facility] Managed Care Coordination team was consulted for assistance with:    COPD  James [name] was given information about Medicaid Managed Care Coordination team services today. James [name] Patient agreed to services and verbal consent obtained.  Engaged with patient by telephone for follow up visit in response to provider referral for case management and/or care coordination services.   Assessments/Interventions:  Review of past medical history, allergies, medications, health status, including review of consultants reports, laboratory and other test data, was performed as part of comprehensive evaluation and provision of chronic care management services.  SDOH (Social Determinants of Health) assessments and interventions performed: SDOH Interventions    Flowsheet Row Telephone from 07/16/2022 in [place] POPULATION HEALTH DEPARTMENT Patient Outreach Telephone from 06/30/2022 in [place] POPULATION HEALTH DEPARTMENT Patient Outreach Telephone from 05/25/2022 in [place] POPULATION HEALTH DEPARTMENT Patient Outreach Telephone from 04/19/2022 in [place] POPULATION HEALTH DEPARTMENT Patient Outreach Telephone from 04/14/2022 in [place] POPULATION HEALTH DEPARTMENT Patient Outreach Telephone from 03/19/2022 in  POPULATION HEALTH DEPARTMENT  SDOH Interventions        Food Insecurity Interventions -- -- -- Intervention Not Indicated -- --  Housing Interventions -- -- Intervention Not Indicated -- -- --  Transportation Interventions Payor Benefit Payor Benefit  [assisted with arranging transportation] Payor Benefit  [Assisted with arranging transportation to upcoming appointments] -- Payor Benefit  [facility] assisted with arranging transportation with UHC] Payor  Benefit  [assisted with arranging transportation to upcoming appointments]  Utilities Interventions -- -- -- Intervention Not Indicated -- --       Care Plan  No Known Allergies  Medications Reviewed Today     Reviewed by [name], RN (Registered Nurse) on 07/21/22 at 0908  Med List Status: <None>   Medication Order Taking? Sig Documenting Provider Last Dose Status Informant  albuterol (PROVENTIL) (2.5 MG/3ML) 0.083% nebulizer solution [id number] Yes Take 3 mLs (2.5 mg total) by nebulization every 4 (four) hours as needed for wheezing or shortness of breath. [name], MD Taking Active Self, Pharmacy Records  albuterol (VENTOLIN HFA) 108 [phone] Base) MCG/ACT inhaler [id number] Yes Inhale 2 puffs into the lungs every 6 (six) hours as needed for wheezing or shortness of breath. [name], MD Taking Active Self, Pharmacy Records           Med Note (COFFELL, Marzella Schlein   Mon Jun 14, 2022 11:29 AM)    atorvastatin (LIPITOR) 10 MG tablet [id number] Yes Take 1 tablet (10 mg total) by mouth daily. [name], MD Taking Active Self, Pharmacy Records           Med Note Epimenio Sarin, Carlota Raspberry Jul 12, 2022  7:53 AM)    azithromycin (ZITHROMAX) 250 MG tablet [id number] Yes Take 1 tablet (250 mg total) by mouth daily for 3 days. Barnetta Chapel, MD Taking Active            Med Note [name], Beckham Buxbaum A   Wed Jul 21, 2022  9:06 AM)    Budeson-Glycopyrrol-Formoterol (BREZTRI AEROSPHERE) 160-9-4.8 MCG/ACT AERO [id number] Yes Inhale 2 puffs into the lungs in the morning and at bedtime. Omar Person, MD Taking Active Pharmacy Records, Self  Med Note (CRUTHIS, CHLOE C   Mon Jul 12, 2022  7:53 AM)    Continuous Glucose Receiver (FREESTYLE LIBRE 2 READER) DEVI [id number]  Use to check blood sugar continuously throughout the day.  Patient not taking: Reported on 07/16/2022   [name], MD  Active Self, Pharmacy Records  Continuous Glucose Sensor ([address]  [place]) [place] [id number]  Use to check blood sugar continuously throughout the day.  Patient not taking: Reported on 07/16/2022   [name], MD  Active Self, Pharmacy Records  folic acid (FOLVITE) 1 MG tablet [id number] Yes Take 1 tablet (1 mg total) by mouth daily. Barnetta Chapel, MD Taking Active   furosemide (LASIX) 40 MG tablet [id number] Yes Take 2 tablets (80 mg total) by mouth daily for 4 days, THEN 1 tablet (40 mg total) daily.  Patient taking differently: 40 mg once daily.   [name], MD Taking Active Self, Pharmacy Records  glipiZIDE (GLUCOTROL XL) 5 MG 24 hr tablet [id number] Yes Take 1 tablet (5 mg total) by mouth daily with breakfast. [name], MD Taking Active Self, Pharmacy Records  losartan (COZAAR) 100 MG tablet [id number] Yes Take 1 tablet (100 mg total) by mouth daily. [name], MD Taking Active Self, Pharmacy Records  metoprolol succinate (TOPROL-XL) 50 MG 24 hr tablet [id number] Yes Take 1 tablet (50 mg total) by mouth daily. Take with or immediately following a meal. [name], MD Taking Active Self, Pharmacy Records  Multiple Vitamin (MULTIVITAMIN WITH MINERALS) TABS tablet [id number] Yes Take 1 tablet by mouth daily. Barnetta Chapel, MD Taking Active   nicotine (NICODERM CQ - DOSED IN MG/24 HOURS) 14 mg/24hr patch [id number] Yes Place 1 patch (14 mg total) onto the skin daily. Barnetta Chapel, MD Taking Active   nystatin cream (MYCOSTATIN) [id number] Yes Apply to affected areas 3 (three) times daily. [name], MD Taking Active Self, Pharmacy Records           Med Note Epimenio Sarin, Carlota Raspberry Jul 12, 2022  7:53 AM)    pantoprazole (PROTONIX) 40 MG tablet [id number] Yes Take 1 tablet (40 mg total) by mouth daily. [name], MD Taking Active Self, Pharmacy Records  predniSONE (DELTASONE) 10 MG tablet [id number] Yes Take 6 tablets (60 mg total) by mouth daily for 3 days, THEN 4 tablets (40 mg total) daily for 3 days, THEN  3 tablets (30 mg total) daily for 3 days, THEN 2 tablets (20 mg total) daily for 3 days, THEN 1 tablet (10 mg total) daily for 3 days. Barnetta Chapel, MD Taking Active            Med Note (Shamiah Kahler A   Wed Jul 21, 2022  9:08 AM)    pregabalin (LYRICA) 100 MG capsule [id number] Yes Take 1 capsule (100 mg total) by mouth 2 (two) times daily. [name], MD Taking Active Self, Pharmacy Records  thiamine (VITAMIN B-1) 100 MG tablet [id number] Yes Take 1 tablet (100 mg total) by mouth daily. Barnetta Chapel, MD Taking Active             Patient Active Problem List   Diagnosis Date Noted   AKI (acute kidney injury) (HCC) 07/12/2022   Abdominal distention 07/12/2022   Alcohol use 07/12/2022   Alcohol dependence in remission (HCC) 03/02/2022   Aortic root dilation (HCC) 03/02/2022   Vision changes 12/29/2021   Oropharyngeal dysphagia 12/29/2021   Onychomycosis  12/29/2021   Malnutrition of moderate degree 12/05/2021   Tinea cruris 07/21/2021   Multiple lung nodules on CT 06/11/2021   COPD with chronic bronchitis 02/01/2021   GERD (gastroesophageal reflux disease) 02/01/2021   Neuropathy 09/29/2020   COPD with acute exacerbation (HCC) 03/26/2020   Bronchiectasis (HCC) 09/24/2019   Type 2 diabetes mellitus (HCC) 07/24/2019   Other atopic dermatitis 05/01/2019   Chronic respiratory failure with hypoxia (HCC) 04/23/2019   Intellectual disability 04/23/2019   HTN (hypertension) 05/17/2016   Former tobacco use 04/06/2016   COPD mixed type (HCC) 03/26/2016   Acute on chronic respiratory failure with hypoxia and hypercapnia (HCC) 06/08/2013    Conditions to be addressed/monitored per PCP order:  COPD  Care Plan : RN Care Manager Plan of Care  Updates made by [name], RN since 07/21/2022 12:00 AM     Problem: Chronic Disease Management needs in patient with COPD   Priority: High     Long-Range Goal: Plan of care established for for Chronic Disease management  for patient with COPD   Start Date: 11/13/2020  Expected End Date: 09/03/2022  Priority: High  Note:   Current Barriers:  Care Coordination needs related to Limited access to food  Chronic Disease Management support and education needs related to COPD James [name] had recent admission for COPD exacerbation. He has all medications and taking as instructed. James [name] shared that he has quit dipping and drinking alcohol and that he is feeling much better. He needs to reschedule missed appointment with Podiatry and hospital follow up with Dr. Delford Field. Patient continues to work with Paramedicine  RNCM Clinical Goal(s):  Patient will verbalize understanding of plan for management of COPD as evidenced by taking all medications as prescribed, attending all scheduled appointments and contacting provider with questions and concerns take all medications exactly as prescribed and will call provider for medication related questions as evidenced by refilling medications and taking as prescribed    attend all scheduled medical appointments: 09/09/22 with PCP as evidenced by provider notes in EMR        demonstrate improved adherence to prescribed treatment plan for COPD as evidenced by taking all medications as prescribed, attending all scheduled appointments and contacting provider with questions and concerns continue to work with RN Care Manager and/or Social Worker to address care management and care coordination needs related to COPD as evidenced by adherence to CM Team Scheduled appointments     through collaboration with RN Care manager, provider, and care team.   Interventions: Inter-disciplinary care team collaboration (see longitudinal plan of care) Evaluation of current treatment plan related to  self management and patient's adherence to plan as established by provider Reviewed medications, discussed dosing details Advised patient to reschedule missed appointment with Dr. Ralene Cork [phone] and schedule  hospital follow up with Dr. Delford Field   SDOH Barriers (Status:  Goal on track:  Yes. Patient interviewed and SDOH assessment performed        SDOH Interventions    Flowsheet Row Patient Outreach Telephone from 06/30/2022 in [place] POPULATION HEALTH DEPARTMENT Patient Outreach Telephone from 05/25/2022 in [place] POPULATION HEALTH DEPARTMENT Patient Outreach Telephone from 04/19/2022 in [place] POPULATION HEALTH DEPARTMENT Patient Outreach Telephone from 04/14/2022 in [place] POPULATION HEALTH DEPARTMENT Patient Outreach Telephone from 03/19/2022 in [place] POPULATION HEALTH DEPARTMENT Patient Outreach Telephone from 02/17/2022 in  POPULATION HEALTH DEPARTMENT  SDOH Interventions        Food Insecurity Interventions -- -- Intervention Not  Indicated -- -- Intervention Not Indicated  Housing Interventions -- Intervention Not Indicated -- -- -- Intervention Not Indicated  Transportation Interventions Payor Benefit  [assisted with arranging transportation] Payor Benefit  [Assisted with arranging transportation to upcoming appointments] -- Payor Benefit  [facility] assisted with arranging transportation with UHC] Payor Benefit  [assisted with arranging transportation to upcoming appointments] Payor Benefit  [Assisted with arranging transportation]  Utilities Interventions -- -- Intervention Not Indicated -- -- Other (Comment)  [Collaborated with BSW for utility assistance]        Patient interviewed and appropriate assessments performed Discussed plans with patient for ongoing care management follow up and provided patient with direct contact information for care management team-   COPD: (Status: Goal on Track (progressing): YES.) Long Term Goal  Reviewed medications with patient, assisted with requesting needed refills Advised patient to self assesses COPD action plan zone and make appointment with provider if in the yellow zone for 48 hours without improvement Provided  education about and advised patient to utilize infection prevention strategies to reduce risk of respiratory infection Discussed the importance of adequate rest and management of fatigue with COPD Infection prevention reviewed Provided encouragement to continue to avoid drinking alcohol and dipping Discussed LCSW referral to managing alcohol and nicotene cessation-patient declined Discussed triggers and avoiding being out in the heat during the hottest part of the day  Patient Goals/Self-Care Activities: Take medications as prescribed   Attend all scheduled provider appointments Call pharmacy for medication refills 3-7 days in advance of running out of medications Perform all self care activities independently  Call provider office for new concerns or questions  - avoid second hand smoke - limit outdoor activity during cold weather - keep follow-up appointments: 05/13/22 with Cardiology - eat healthy/prescribed diet: DASH - get at least 7 to 8 hours of sleep at night       Follow Up:  Patient agrees to Care Plan and Follow-up.  Plan: The Managed Medicaid care management team will reach out to the patient again over the next 30 days.  Date/time of next scheduled RN care management/care coordination outreach:  8/19-9am  [name] RN, BSN [place]  Managed [facility] RN Care Coordinator [phone]

## 2022-07-21 NOTE — Progress Notes (Signed)
Paramedicine Encounter    Patient ID: James [name], male    DOB: 31-Dec-1960, 62 y.o.   MRN: [id number]   Complaints-doing better   Edema-some to legs-better then in the past  Compliance with meds-yes -per his report   Pill box filled-n/a  does not want one  If so, by whom-n/a  Refills needed-none      Pt reports he is feeling better after his recent admission.  He said he had a fall previous and injured his rib area and couldn't cough and feels b/c of that he wasn't able to cough up the phlegm and it caused pneumonia again. He reports he has a couple more days of his antibiotics left.  He has all meds. He does not want to use the freestyle. He said it was difficult to use, but I tried to explain it for him but he just doesn't want to use anything.  He didn't check his CBG's anyways.  He has PCP appoint next week and will f/u in a couple wks.    BP 110/70   Pulse 80   Resp 18   SpO2 97%    Patient Care Team: [name], MD as PCP - General (Pulmonary Disease) O'Neal, Ronnald Ramp, MD as PCP - Cardiology (Cardiology) [name], RN as Case Manager Gustavus Bryant, LCSW as [facility] Care Management (Licensed Clinical Social Worker)  Patient Active Problem List   Diagnosis Date Noted   AKI (acute kidney injury) (HCC) 07/12/2022   Abdominal distention 07/12/2022   Alcohol use 07/12/2022   Alcohol dependence in remission (HCC) 03/02/2022   Aortic root dilation (HCC) 03/02/2022   Vision changes 12/29/2021   Oropharyngeal dysphagia 12/29/2021   Onychomycosis 12/29/2021   Malnutrition of moderate degree 12/05/2021   Tinea cruris 07/21/2021   Multiple lung nodules on CT 06/11/2021   COPD with chronic bronchitis 02/01/2021   GERD (gastroesophageal reflux disease) 02/01/2021   Neuropathy 09/29/2020   COPD with acute exacerbation (HCC) 03/26/2020   Bronchiectasis (HCC) 09/24/2019   Type 2 diabetes mellitus (HCC) 07/24/2019   Other atopic  dermatitis 05/01/2019   Chronic respiratory failure with hypoxia (HCC) 04/23/2019   Intellectual disability 04/23/2019   HTN (hypertension) 05/17/2016   Former tobacco use 04/06/2016   COPD mixed type (HCC) 03/26/2016   Acute on chronic respiratory failure with hypoxia and hypercapnia (HCC) 06/08/2013    Current Outpatient Medications:    albuterol (PROVENTIL) (2.5 MG/3ML) 0.083% nebulizer solution, Take 3 mLs (2.5 mg total) by nebulization every 4 (four) hours as needed for wheezing or shortness of breath., Disp: 270 mL, Rfl: 1   albuterol (VENTOLIN HFA) 108 (90 Base) MCG/ACT inhaler, Inhale 2 puffs into the lungs every 6 (six) hours as needed for wheezing or shortness of breath., Disp: 54 g, Rfl: 1   atorvastatin (LIPITOR) 10 MG tablet, Take 1 tablet (10 mg total) by mouth daily., Disp: 90 tablet, Rfl: 3   azithromycin (ZITHROMAX) 250 MG tablet, Take 1 tablet (250 mg total) by mouth daily for 3 days., Disp: 3 tablet, Rfl: 0   Budeson-Glycopyrrol-Formoterol (BREZTRI AEROSPHERE) 160-9-4.8 MCG/ACT AERO, Inhale 2 puffs into the lungs in the morning and at bedtime., Disp: 10.7 g, Rfl: 11   folic acid (FOLVITE) 1 MG tablet, Take 1 tablet (1 mg total) by mouth daily., Disp: 30 tablet, Rfl: 0   furosemide (LASIX) 40 MG tablet, Take 2 tablets (80 mg total) by mouth daily for 4 days, THEN 1 tablet (40 mg total)  daily. (Patient taking differently: 40 mg once daily.), Disp: 90 tablet, Rfl: 1   glipiZIDE (GLUCOTROL XL) 5 MG 24 hr tablet, Take 1 tablet (5 mg total) by mouth daily with breakfast., Disp: 90 tablet, Rfl: 1   losartan (COZAAR) 100 MG tablet, Take 1 tablet (100 mg total) by mouth daily., Disp: 90 tablet, Rfl: 2   metoprolol succinate (TOPROL-XL) 50 MG 24 hr tablet, Take 1 tablet (50 mg total) by mouth daily. Take with or immediately following a meal., Disp: 90 tablet, Rfl: 2   Multiple Vitamin (MULTIVITAMIN WITH MINERALS) TABS tablet, Take 1 tablet by mouth daily., Disp: 30 tablet, Rfl: 0    nicotine (NICODERM CQ - DOSED IN MG/24 HOURS) 14 mg/24hr patch, Place 1 patch (14 mg total) onto the skin daily., Disp: 28 patch, Rfl: 0   nystatin cream (MYCOSTATIN), Apply to affected areas 3 (three) times daily., Disp: 30 g, Rfl: 2   pantoprazole (PROTONIX) 40 MG tablet, Take 1 tablet (40 mg total) by mouth daily., Disp: 90 tablet, Rfl: 6   predniSONE (DELTASONE) 10 MG tablet, Take 6 tablets (60 mg total) by mouth daily for 3 days, THEN 4 tablets (40 mg total) daily for 3 days, THEN 3 tablets (30 mg total) daily for 3 days, THEN 2 tablets (20 mg total) daily for 3 days, THEN 1 tablet (10 mg total) daily for 3 days., Disp: 48 tablet, Rfl: 0   pregabalin (LYRICA) 100 MG capsule, Take 1 capsule (100 mg total) by mouth 2 (two) times daily., Disp: 60 capsule, Rfl: 2   thiamine (VITAMIN B-1) 100 MG tablet, Take 1 tablet (100 mg total) by mouth daily., Disp: 30 tablet, Rfl: 0   Continuous Glucose Receiver (FREESTYLE LIBRE 2 READER) DEVI, Use to check blood sugar continuously throughout the day. (Patient not taking: Reported on 07/16/2022), Disp: 1 each, Rfl: 0   Continuous Glucose Sensor (FREESTYLE LIBRE 2 SENSOR) MISC, Use to check blood sugar continuously throughout the day. (Patient not taking: Reported on 07/16/2022), Disp: 2 each, Rfl: 6 No Known Allergies    Social History   Socioeconomic History   Marital status: Divorced    Spouse name: Not on file   Number of children: Not on file   Years of education: Not on file   Highest education level: Not on file  Occupational History   Occupation: Curator   Occupation: Painter  Tobacco Use   Smoking status: Former    Current packs/day: 0.00    Average packs/day: 1.5 packs/day for 40.0 years (60.0 ttl pk-yrs)    Types: Cigarettes    Start date: 65    Quit date: 2014    Years since quitting: 10.5   Smokeless tobacco: Former    Types: Engineer, drilling   Vaping status: Never Used  Substance and Sexual Activity   Alcohol use: Yes     Alcohol/week: 4.0 standard drinks of alcohol    Types: 4 Cans of beer per week    Comment: 4 beers a night previously   Drug use: Not Currently    Types: Marijuana    Comment: daily; quit using crack and methamphetamine about 5 months ago    Sexual activity: Not Currently    Partners: Female  Other Topics Concern   Not on file  Social History Narrative   L[place] alone near [place]   Social Determinants of Health   Financial Resource Strain: Low Risk  (12/25/2020)   Overall Financial Resource Strain (CARDIA)    Difficulty  of Paying Living Expenses: Not very hard  Recent Concern: Financial Resource Strain - High Risk (10/29/2020)   Overall Financial Resource Strain (CARDIA)    Difficulty of Paying Living Expenses: Hard  Food Insecurity: No Food Insecurity (07/12/2022)   Hunger Vital Sign    Worried About Running Out of Food in the Last Year: Never true    Ran Out of Food in the Last Year: Never true  Transportation Needs: Unmet Transportation Needs (07/16/2022)   PRAPARE - Administrator, Civil Service (Medical): Yes    Lack of Transportation (Non-Medical): Yes  Physical Activity: Inactive (12/11/2020)   Exercise Vital Sign    Days of Exercise per Week: 0 days    Minutes of Exercise per Session: 0 min  Stress: No Stress Concern Present (12/25/2020)   [name] of Occupational Health - Occupational Stress Questionnaire    Feeling of Stress : Not at all  Social Connections: Moderately Integrated (11/13/2020)   Social Connection and Isolation Panel [NHANES]    Frequency of Communication with Friends and Family: More than three times a week    Frequency of Social Gatherings with Friends and Family: Once a week    Attends Religious Services: 1 to 4 times per year    Active Member of [employer] or Organizations: Yes    Attends Banker Meetings: 1 to 4 times per year    Marital Status: Divorced  Intimate Partner Violence: Not At Risk (07/12/2022)    Humiliation, Afraid, Rape, and Kick questionnaire    Fear of Current or Ex-Partner: No    Emotionally Abused: No    Physically Abused: No    Sexually Abused: No    Physical Exam      Future Appointments  Date Time Provider Department Center  07/27/2022  3:10 PM [name], MD [facility] None  08/23/2022  9:00 AM [name], RN [facility] None  09/09/2022  9:50 AM [name], MD [facility] None       Kerry Hough, Paramedic [phone] [facility] Paramedic  07/21/22

## 2022-07-21 NOTE — Telephone Encounter (Signed)
I called the patient and scheduled him for a hospital follow up appointment with Dr Delford Field- 07/27/2022 @ 1510

## 2022-07-21 NOTE — Patient Instructions (Signed)
Visit Information  [name] was given information about Medicaid Managed Care team care coordination services as a part of their Healthy [facility] Medicaid benefit. James [name] verbally consented to engagement with the [facility] Managed Care team.   If you are experiencing a medical emergency, please call 911 or report to your local emergency department or urgent care.   If you have a non-emergency medical problem during routine business hours, please contact your provider's office and ask to speak with a nurse.   For questions related to your Healthy [facility] health plan, please call: [phone] or visit the homepage here: [email or web address]  If you would like to schedule transportation through your Healthy [facility] plan, please call the following number at least 2 days in advance of your appointment: [phone]  For information about your ride after you set it up, call Ride Assist at [phone]. Use this number to activate a Will Call pickup, or if your transportation is late for a scheduled pickup. Use this number, too, if you need to make a change or cancel a previously scheduled reservation.  If you need transportation services right away, call [phone]. The after-hours call center is staffed 24 hours to handle ride assistance and urgent reservation requests (including discharges) 365 days a year. Urgent trips include sick visits, hospital discharge requests and life-sustaining treatment.  Call the [facility] Line at [phone], at any time, 24 hours a day, 7 days a week. If you are in danger or need immediate medical attention call 911.  If you would like help to quit smoking, call 1-800-QUIT-NOW (843-775-6320) OR Espaol: 1-855-Djelo-Ya (5-638-756-4332) o para ms informacin haga clic aqu or Text READY to 951-884 to register via text  James [name],   Please see education materials related to foot care provided as  print materials.   The patient verbalized understanding of instructions, educational materials, and care plan provided today and agreed to receive a mailed copy of patient instructions, educational materials, and care plan.   Telephone follow up appointment with Managed Medicaid care management team member scheduled for:08/23/22 @ 9am  James Emms RN, BSN [place]  Managed [facility] RN Care Coordinator [phone]   Following is a copy of your plan of care:  Care Plan : RN Care Manager Plan of Care  Updates made by [name], RN since 07/21/2022 12:00 AM     Problem: Chronic Disease Management needs in patient with COPD   Priority: High     Long-Range Goal: Plan of care established for for Chronic Disease management for patient with COPD   Start Date: 11/13/2020  Expected End Date: 09/03/2022  Priority: High  Note:   Current Barriers:  Care Coordination needs related to Limited access to food  Chronic Disease Management support and education needs related to COPD James [name] had recent admission for COPD exacerbation. He has all medications and taking as instructed. James [name] shared that he has quit dipping and drinking alcohol and that he is feeling much better. He needs to reschedule missed appointment with Podiatry and hospital follow up with Dr. Delford Field. Patient continues to work with Paramedicine  RNCM Clinical Goal(s):  Patient will verbalize understanding of plan for management of COPD as evidenced by taking all medications as prescribed, attending all scheduled appointments and contacting provider with questions and concerns take all medications exactly as prescribed and will call provider for medication related questions as evidenced by refilling medications and taking as prescribed    attend all scheduled  medical appointments: 09/09/22 with PCP as evidenced by provider notes in EMR        demonstrate improved adherence to prescribed treatment plan for COPD as  evidenced by taking all medications as prescribed, attending all scheduled appointments and contacting provider with questions and concerns continue to work with RN Care Manager and/or Social Worker to address care management and care coordination needs related to COPD as evidenced by adherence to CM Team Scheduled appointments     through collaboration with RN Care manager, provider, and care team.   Interventions: Inter-disciplinary care team collaboration (see longitudinal plan of care) Evaluation of current treatment plan related to  self management and patient's adherence to plan as established by provider Reviewed medications, discussed dosing details Advised patient to reschedule missed appointment with Dr. Ralene Cork [phone] and schedule hospital follow up with Dr. Delford Field   SDOH Barriers (Status:  Goal on track:  Yes. Patient interviewed and SDOH assessment performed        SDOH Interventions    Flowsheet Row Patient Outreach Telephone from 06/30/2022 in [place] POPULATION HEALTH DEPARTMENT Patient Outreach Telephone from 05/25/2022 in [place] POPULATION HEALTH DEPARTMENT Patient Outreach Telephone from 04/19/2022 in [place] POPULATION HEALTH DEPARTMENT Patient Outreach Telephone from 04/14/2022 in [place] POPULATION HEALTH DEPARTMENT Patient Outreach Telephone from 03/19/2022 in [place] POPULATION HEALTH DEPARTMENT Patient Outreach Telephone from 02/17/2022 in [place] POPULATION HEALTH DEPARTMENT  SDOH Interventions        Food Insecurity Interventions -- -- Intervention Not Indicated -- -- Intervention Not Indicated  Housing Interventions -- Intervention Not Indicated -- -- -- Intervention Not Indicated  Transportation Interventions Payor Benefit  [assisted with arranging transportation] Payor Benefit  [Assisted with arranging transportation to upcoming appointments] -- Payor Benefit  [facility] assisted with arranging transportation with UHC] Payor Benefit  [assisted with  arranging transportation to upcoming appointments] Payor Benefit  [Assisted with arranging transportation]  Utilities Interventions -- -- Intervention Not Indicated -- -- Other (Comment)  [Collaborated with BSW for utility assistance]        Patient interviewed and appropriate assessments performed Discussed plans with patient for ongoing care management follow up and provided patient with direct contact information for care management team-   COPD: (Status: Goal on Track (progressing): YES.) Long Term Goal  Reviewed medications with patient, assisted with requesting needed refills Advised patient to self assesses COPD action plan zone and make appointment with provider if in the yellow zone for 48 hours without improvement Provided education about and advised patient to utilize infection prevention strategies to reduce risk of respiratory infection Discussed the importance of adequate rest and management of fatigue with COPD Infection prevention reviewed Provided encouragement to continue to avoid drinking alcohol and dipping Discussed LCSW referral to managing alcohol and nicotene cessation-patient declined Discussed triggers and avoiding being out in the heat during the hottest part of the day  Patient Goals/Self-Care Activities: Take medications as prescribed   Attend all scheduled provider appointments Call pharmacy for medication refills 3-7 days in advance of running out of medications Perform all self care activities independently  Call provider office for new concerns or questions  - avoid second hand smoke - limit outdoor activity during cold weather - keep follow-up appointments: 05/13/22 with Cardiology - eat healthy/prescribed diet: DASH - get at least 7 to 8 hours of sleep at night

## 2022-07-22 ENCOUNTER — Other Ambulatory Visit: Payer: Medicaid Other | Admitting: *Deleted

## 2022-07-22 ENCOUNTER — Telehealth (HOSPITAL_COMMUNITY): Payer: Self-pay

## 2022-07-22 NOTE — Telephone Encounter (Addendum)
Patient is now discharged from [employer].  Patient has/has not met the following goals:  Yes :Patient expresses basic understanding of medications and what they are for Yes :Patient able to verbalize heart failure specific dietary/fluid restrictions(COPD)  Yes :Patient is aware of who to call if they have medical concerns or if they need to schedule or change appts  Yes :Patient able to verbalize concerning symptoms when they should call the [facility] clinic (weight gain ranges, etc) Yes :Patient has a PCP and has seen within the past year or has upcoming appt Yes :Patient has reliable access to getting their medications Yes :Patient has shown they are able to reorder medications reliably Yes :Patient has had admission in past 30 days- if yes how many? Yes :Patient has had admission in past 90 days- if yes how many?  Discharge Comments:  I seen pt yesterday and he looked good. Still refusing to check his blood sugars and he refused to use the freestyle device.   He is managing his meds, his sister helps with rides and he has his meds mailed out to him. There sometimes is a gap in taking meds but its usually due to him waiting on pharmacy to bring them out, but we discussed to be sure to call in refills in more advance time so this doesn't happen.   With him, I dont know what else to do to get ahead of these exacerbations he has and to prevent pneumonia. His living situation is not going to change, he's pretty set in his ways there.  I've told him to stay out of the heat, limit his activities, dont over exert,  he seems to do pretty good with compliance with his meds.  Right now he has things in place that he needs and has the resources available to him.  Discussed with Dr. Delford Field and he is in agreement with this plan as well.  Can refer back in future if needed.   Kerry Hough, EMT-Paramedic  [phone] 07/22/2022

## 2022-07-22 NOTE — Patient Outreach (Signed)
Care Coordination  07/22/2022  James [name] 12-17-1960 [id number]   RNCM assisted James [name] with arranging transportation to hospital follow up appointment with Dr. Delford Field on 07/27/22. Transportation will pick up at 2:10pm and patient will call for return ride home, ref # [id number]. RNCM will follow up with patient during next scheduled telephone outreach.  [name] RN, BSN [place]  Managed [facility] RN Care Coordinator [phone]

## 2022-07-23 ENCOUNTER — Other Ambulatory Visit: Payer: Medicaid Other | Admitting: *Deleted

## 2022-07-23 NOTE — Patient Outreach (Signed)
Medicaid Managed Care   Nurse Care Manager Note  07/23/2022 Name:  James [name] MRN:  [id number] DOB:  1960-02-23  James [name] is an 62 y.o. year old male who is a primary patient of James Field Charlcie Cradle, MD.  The [facility] Managed Care Coordination team was consulted for assistance with:    COPD  James [name] was given information about Medicaid Managed Care Coordination team services today. James [name] Patient agreed to services and verbal consent obtained.  Engaged with patient by telephone for follow up visit in response to provider referral for case management and/or care coordination services.   Assessments/Interventions:  Review of past medical history, allergies, medications, health status, including review of consultants reports, laboratory and other test data, was performed as part of comprehensive evaluation and provision of chronic care management services.  SDOH (Social Determinants of Health) assessments and interventions performed: SDOH Interventions    Flowsheet Row Telephone from 07/16/2022 in [place] POPULATION HEALTH DEPARTMENT Patient Outreach Telephone from 06/30/2022 in [place] POPULATION HEALTH DEPARTMENT Patient Outreach Telephone from 05/25/2022 in [place] POPULATION HEALTH DEPARTMENT Patient Outreach Telephone from 04/19/2022 in [place] POPULATION HEALTH DEPARTMENT Patient Outreach Telephone from 04/14/2022 in  POPULATION HEALTH DEPARTMENT Patient Outreach Telephone from 03/19/2022 in  POPULATION HEALTH DEPARTMENT  SDOH Interventions        Food Insecurity Interventions -- -- -- Intervention Not Indicated -- --  Housing Interventions -- -- Intervention Not Indicated -- -- --  Transportation Interventions Payor Benefit Payor Benefit  [assisted with arranging transportation] Payor Benefit  [Assisted with arranging transportation to upcoming appointments] -- Payor Benefit  [facility] assisted with arranging transportation with UHC] Payor  Benefit  [assisted with arranging transportation to upcoming appointments]  Utilities Interventions -- -- -- Intervention Not Indicated -- --       Care Plan  No Known Allergies  Medications Reviewed Today     Reviewed by Kerry Hough, Paramedic (Certified Medical Assistant) on 07/21/22 at 1153  Med List Status: <None>   Medication Order Taking? Sig Documenting Provider Last Dose Status Informant  albuterol (PROVENTIL) (2.5 MG/3ML) 0.083% nebulizer solution [id number] Yes Take 3 mLs (2.5 mg total) by nebulization every 4 (four) hours as needed for wheezing or shortness of breath. [name], MD Taking Active Self, Pharmacy Records  albuterol (VENTOLIN HFA) 108 [phone] Base) MCG/ACT inhaler [id number] Yes Inhale 2 puffs into the lungs every 6 (six) hours as needed for wheezing or shortness of breath. [name], MD Taking Active Self, Pharmacy Records           Med Note (COFFELL, Marzella Schlein   Mon Jun 14, 2022 11:29 AM)    atorvastatin (LIPITOR) 10 MG tablet [id number] Yes Take 1 tablet (10 mg total) by mouth daily. [name], MD Taking Active Self, Pharmacy Records           Med Note Epimenio Sarin, Carlota Raspberry Jul 12, 2022  7:53 AM)    azithromycin (ZITHROMAX) 250 MG tablet [id number] Yes Take 1 tablet (250 mg total) by mouth daily for 3 days. Barnetta Chapel, MD Taking Active            Med Note [name], Nidhi Jacome A   Wed Jul 21, 2022  9:06 AM)    Budeson-Glycopyrrol-Formoterol (BREZTRI AEROSPHERE) 160-9-4.8 MCG/ACT AERO [id number] Yes Inhale 2 puffs into the lungs in the morning and at bedtime. Omar Person, MD Taking Active Pharmacy Records, Self  Med Note (CRUTHIS, CHLOE C   Mon Jul 12, 2022  7:53 AM)    Continuous Glucose Receiver (FREESTYLE LIBRE 2 READER) DEVI [id number]  Use to check blood sugar continuously throughout the day.  Patient not taking: Reported on 07/16/2022   [name], MD  Active Self, Pharmacy Records  Continuous Glucose Sensor  ([address] [place] [address] [place]) [place] [phone]  Use to check blood sugar continuously throughout the day.  Patient not taking: Reported on 07/16/2022   [name], MD  Active Self, Pharmacy Records  folic acid (FOLVITE) 1 MG tablet [id number] Yes Take 1 tablet (1 mg total) by mouth daily. Barnetta Chapel, MD Taking Active   furosemide (LASIX) 40 MG tablet [id number] Yes Take 2 tablets (80 mg total) by mouth daily for 4 days, THEN 1 tablet (40 mg total) daily.  Patient taking differently: 40 mg once daily.   [name], MD Taking Active Self, Pharmacy Records  glipiZIDE (GLUCOTROL XL) 5 MG 24 hr tablet [id number] Yes Take 1 tablet (5 mg total) by mouth daily with breakfast. [name], MD Taking Active Self, Pharmacy Records  losartan (COZAAR) 100 MG tablet [id number] Yes Take 1 tablet (100 mg total) by mouth daily. [name], MD Taking Active Self, Pharmacy Records  metoprolol succinate (TOPROL-XL) 50 MG 24 hr tablet [id number] Yes Take 1 tablet (50 mg total) by mouth daily. Take with or immediately following a meal. [name], MD Taking Active Self, Pharmacy Records  Multiple Vitamin (MULTIVITAMIN WITH MINERALS) TABS tablet [id number] Yes Take 1 tablet by mouth daily. Barnetta Chapel, MD Taking Active   nicotine (NICODERM CQ - DOSED IN MG/24 HOURS) 14 mg/24hr patch [id number] Yes Place 1 patch (14 mg total) onto the skin daily. Barnetta Chapel, MD Taking Active   nystatin cream (MYCOSTATIN) [id number] Yes Apply to affected areas 3 (three) times daily. [name], MD Taking Active Self, Pharmacy Records           Med Note Epimenio Sarin, Carlota Raspberry Jul 12, 2022  7:53 AM)    pantoprazole (PROTONIX) 40 MG tablet [id number] Yes Take 1 tablet (40 mg total) by mouth daily. [name], MD Taking Active Self, Pharmacy Records  predniSONE (DELTASONE) 10 MG tablet [id number] Yes Take 6 tablets (60 mg total) by mouth daily for 3 days, THEN 4 tablets (40 mg total)  daily for 3 days, THEN 3 tablets (30 mg total) daily for 3 days, THEN 2 tablets (20 mg total) daily for 3 days, THEN 1 tablet (10 mg total) daily for 3 days. Barnetta Chapel, MD Taking Active            Med Note (Dreshawn Hendershott A   Wed Jul 21, 2022  9:08 AM)    pregabalin (LYRICA) 100 MG capsule [id number] Yes Take 1 capsule (100 mg total) by mouth 2 (two) times daily. [name], MD Taking Active Self, Pharmacy Records  thiamine (VITAMIN B-1) 100 MG tablet [id number] Yes Take 1 tablet (100 mg total) by mouth daily. Barnetta Chapel, MD Taking Active             Patient Active Problem List   Diagnosis Date Noted   AKI (acute kidney injury) (HCC) 07/12/2022   Abdominal distention 07/12/2022   Alcohol use 07/12/2022   Alcohol dependence in remission (HCC) 03/02/2022   Aortic root dilation (HCC) 03/02/2022   Vision changes 12/29/2021   Oropharyngeal dysphagia 12/29/2021   Onychomycosis  12/29/2021   Malnutrition of moderate degree 12/05/2021   Tinea cruris 07/21/2021   Multiple lung nodules on CT 06/11/2021   COPD with chronic bronchitis 02/01/2021   GERD (gastroesophageal reflux disease) 02/01/2021   Neuropathy 09/29/2020   COPD with acute exacerbation (HCC) 03/26/2020   Bronchiectasis (HCC) 09/24/2019   Type 2 diabetes mellitus (HCC) 07/24/2019   Other atopic dermatitis 05/01/2019   Chronic respiratory failure with hypoxia (HCC) 04/23/2019   Intellectual disability 04/23/2019   HTN (hypertension) 05/17/2016   Former tobacco use 04/06/2016   COPD mixed type (HCC) 03/26/2016   Acute on chronic respiratory failure with hypoxia and hypercapnia (HCC) 06/08/2013    Conditions to be addressed/monitored per PCP order:  COPD  Care Plan : RN Care Manager Plan of Care  Updates made by James Dach, RN since 07/23/2022 12:00 AM     Problem: Chronic Disease Management needs in patient with COPD   Priority: High     Long-Range Goal: Plan of care established for for  Chronic Disease management for patient with COPD   Start Date: 11/13/2020  Expected End Date: 09/03/2022  Priority: High  Note:   Current Barriers:  Care Coordination needs related to Limited access to food  Chronic Disease Management support and education needs related to COPD Mr. Monje had recent admission for COPD exacerbation. He has all medications and taking as instructed. Mr. Loeper shared that he has quit dipping and drinking alcohol and that he is feeling much better. He needs to reschedule missed appointment with Podiatry and hospital follow up with Dr. Delford Field. Patient continues to work with Paramedicine  RNCM Clinical Goal(s):  Patient will verbalize understanding of plan for management of COPD as evidenced by taking all medications as prescribed, attending all scheduled appointments and contacting provider with questions and concerns take all medications exactly as prescribed and will call provider for medication related questions as evidenced by refilling medications and taking as prescribed    attend all scheduled medical appointments: 09/09/22 with PCP as evidenced by provider notes in EMR        demonstrate improved adherence to prescribed treatment plan for COPD as evidenced by taking all medications as prescribed, attending all scheduled appointments and contacting provider with questions and concerns continue to work with RN Care Manager and/or Social Worker to address care management and care coordination needs related to COPD as evidenced by adherence to CM Team Scheduled appointments     through collaboration with RN Care manager, provider, and care team.   Interventions: Inter-disciplinary care team collaboration (see longitudinal plan of care) Evaluation of current treatment plan related to  self management and patient's adherence to plan as established by provider Assisted with arranging transportation to [facility] on 08/03/22, pick up time from home at 1:15pm, patient will call  for pick up to return home Created a one page document with providers, address and phone numbers, will mail to patient and work with having him call to arrange future appointments   SDOH Barriers (Status:  Goal on track:  Yes. Patient interviewed and SDOH assessment performed        SDOH Interventions    Flowsheet Row Patient Outreach Telephone from 06/30/2022 in [place] POPULATION HEALTH DEPARTMENT Patient Outreach Telephone from 05/25/2022 in [place] POPULATION HEALTH DEPARTMENT Patient Outreach Telephone from 04/19/2022 in [place] POPULATION HEALTH DEPARTMENT Patient Outreach Telephone from 04/14/2022 in [place] POPULATION HEALTH DEPARTMENT Patient Outreach Telephone from 03/19/2022 in [place] POPULATION HEALTH DEPARTMENT Patient Outreach  Telephone from 02/17/2022 in [place] POPULATION HEALTH DEPARTMENT  SDOH Interventions        Food Insecurity Interventions -- -- Intervention Not Indicated -- -- Intervention Not Indicated  Housing Interventions -- Intervention Not Indicated -- -- -- Intervention Not Indicated  Transportation Interventions Payor Benefit  [assisted with arranging transportation] Payor Benefit  [Assisted with arranging transportation to upcoming appointments] -- Payor Benefit  [facility] assisted with arranging transportation with UHC] Payor Benefit  [assisted with arranging transportation to upcoming appointments] Payor Benefit  [Assisted with arranging transportation]  Utilities Interventions -- -- Intervention Not Indicated -- -- Other (Comment)  [Collaborated with BSW for utility assistance]        Patient interviewed and appropriate assessments performed Discussed plans with patient for ongoing care management follow up and provided patient with direct contact information for care management team-   COPD: (Status: Goal on Track (progressing): YES.) Long Term Goal  Reviewed medications with patient, assisted with requesting needed refills Advised patient  to self assesses COPD action plan zone and make appointment with provider if in the yellow zone for 48 hours without improvement Provided education about and advised patient to utilize infection prevention strategies to reduce risk of respiratory infection Discussed the importance of adequate rest and management of fatigue with COPD Infection prevention reviewed Provided encouragement to continue to avoid drinking alcohol and dipping Discussed LCSW referral to managing alcohol and nicotene cessation-patient declined Discussed triggers and avoiding being out in the heat during the hottest part of the day  Patient Goals/Self-Care Activities: Take medications as prescribed   Attend all scheduled provider appointments Call pharmacy for medication refills 3-7 days in advance of running out of medications Perform all self care activities independently  Call provider office for new concerns or questions  - avoid second hand smoke - limit outdoor activity during cold weather - keep follow-up appointments: 05/13/22 with Cardiology - eat healthy/prescribed diet: DASH - get at least 7 to 8 hours of sleep at night       Follow Up:  Patient agrees to Care Plan and Follow-up.  Plan: The Managed Medicaid care management team will reach out to the patient again over the next 30 days.  Date/time of next scheduled RN care management/care coordination outreach:  08/23/22 @ 9am  [name] RN, BSN [place]  Managed [facility] RN Care Coordinator [phone]

## 2022-07-23 NOTE — Patient Instructions (Addendum)
Visit Information  Mr. Mavis was given information about Medicaid Managed Care team care coordination services as a part of their Healthy [facility] Medicaid benefit. [name] verbally consented to engagement with the [facility] Managed Care team.   If you are experiencing a medical emergency, please call 911 or report to your local emergency department or urgent care.   If you have a non-emergency medical problem during routine business hours, please contact your provider's office and ask to speak with a nurse.   For questions related to your [facility] health plan, please call: [phone] or visit the homepage here: [email or web address]  If you would like to schedule transportation through your [facility] plan, please call the following number at least 2 days in advance of your appointment: [phone]  For information about your ride after you set it up, call Ride Assist at [phone]. Use this number to activate a Will Call pickup, or if your transportation is late for a scheduled pickup. Use this number, too, if you need to make a change or cancel a previously scheduled reservation.  If you need transportation services right away, call [phone]. The after-hours call center is staffed 24 hours to handle ride assistance and urgent reservation requests (including discharges) 365 days a year. Urgent trips include sick visits, hospital discharge requests and life-sustaining treatment.  Call the [facility] Line at [phone], at any time, 24 hours a day, 7 days a week. If you are in danger or need immediate medical attention call 911.  If you would like help to quit smoking, call 1-800-QUIT-NOW (9287850441) OR Espaol: 1-855-Djelo-Ya (4-742-595-6387) o para ms informacin haga clic aqu or Text READY to 564-332 to register via text  Mr. Towry,   Please see education materials related to provider information  provided as print materials.   The patient verbalized understanding of instructions, educational materials, and care plan provided today and agreed to receive a mailed copy of patient instructions, educational materials, and care plan.   Telephone follow up appointment with Managed Medicaid care management team member scheduled for:08/23/22 @ 9am  [name] RN, BSN [place]  Managed [facility] RN Care Coordinator [phone]   Following is a copy of your plan of care:  Care Plan : RN Care Manager Plan of Care  Updates made by [name], RN since 07/23/2022 12:00 AM     Problem: Chronic Disease Management needs in patient with COPD   Priority: High     Long-Range Goal: Plan of care established for for Chronic Disease management for patient with COPD   Start Date: 11/13/2020  Expected End Date: 09/03/2022  Priority: High  Note:   Current Barriers:  Care Coordination needs related to Limited access to food  Chronic Disease Management support and education needs related to COPD Mr. Wingrove had recent admission for COPD exacerbation. He has all medications and taking as instructed. Mr. Carattini shared that he has quit dipping and drinking alcohol and that he is feeling much better. He needs to reschedule missed appointment with Podiatry and hospital follow up with Dr. Delford Field. Patient continues to work with Paramedicine  RNCM Clinical Goal(s):  Patient will verbalize understanding of plan for management of COPD as evidenced by taking all medications as prescribed, attending all scheduled appointments and contacting provider with questions and concerns take all medications exactly as prescribed and will call provider for medication related questions as evidenced by refilling medications and taking as prescribed    attend all scheduled  medical appointments: 09/09/22 with PCP as evidenced by provider notes in EMR        demonstrate improved adherence to prescribed treatment plan for  COPD as evidenced by taking all medications as prescribed, attending all scheduled appointments and contacting provider with questions and concerns continue to work with RN Care Manager and/or Social Worker to address care management and care coordination needs related to COPD as evidenced by adherence to CM Team Scheduled appointments     through collaboration with RN Care manager, provider, and care team.   Interventions: Inter-disciplinary care team collaboration (see longitudinal plan of care) Evaluation of current treatment plan related to  self management and patient's adherence to plan as established by provider Assisted with arranging transportation to [facility] on 08/03/22, pick up time from home at 1:15pm, patient will call for pick up to return home Created a one page document with providers, address and phone numbers, will mail to patient and work with having him call to arrange future appointments   SDOH Barriers (Status:  Goal on track:  Yes. Patient interviewed and SDOH assessment performed        SDOH Interventions    Flowsheet Row Patient Outreach Telephone from 06/30/2022 in [place] POPULATION HEALTH DEPARTMENT Patient Outreach Telephone from 05/25/2022 in [place] POPULATION HEALTH DEPARTMENT Patient Outreach Telephone from 04/19/2022 in [place] POPULATION HEALTH DEPARTMENT Patient Outreach Telephone from 04/14/2022 in [place] POPULATION HEALTH DEPARTMENT Patient Outreach Telephone from 03/19/2022 in [place] POPULATION HEALTH DEPARTMENT Patient Outreach Telephone from 02/17/2022 in [place] POPULATION HEALTH DEPARTMENT  SDOH Interventions        Food Insecurity Interventions -- -- Intervention Not Indicated -- -- Intervention Not Indicated  Housing Interventions -- Intervention Not Indicated -- -- -- Intervention Not Indicated  Transportation Interventions Payor Benefit  [assisted with arranging transportation] Payor Benefit  [Assisted with arranging transportation to  upcoming appointments] -- Payor Benefit  [facility] assisted with arranging transportation with [employer]] Payor Benefit  [assisted with arranging transportation to upcoming appointments] Payor Benefit  [Assisted with arranging transportation]  Utilities Interventions -- -- Intervention Not Indicated -- -- Other (Comment)  [Collaborated with BSW for utility assistance]        Patient interviewed and appropriate assessments performed Discussed plans with patient for ongoing care management follow up and provided patient with direct contact information for care management team-   COPD: (Status: Goal on Track (progressing): YES.) Long Term Goal  Reviewed medications with patient, assisted with requesting needed refills Advised patient to self assesses COPD action plan zone and make appointment with provider if in the yellow zone for 48 hours without improvement Provided education about and advised patient to utilize infection prevention strategies to reduce risk of respiratory infection Discussed the importance of adequate rest and management of fatigue with COPD Infection prevention reviewed Provided encouragement to continue to avoid drinking alcohol and dipping Discussed LCSW referral to managing alcohol and nicotene cessation-patient declined Discussed triggers and avoiding being out in the heat during the hottest part of the day  Patient Goals/Self-Care Activities: Take medications as prescribed   Attend all scheduled provider appointments Call pharmacy for medication refills 3-7 days in advance of running out of medications Perform all self care activities independently  Call provider office for new concerns or questions  - avoid second hand smoke - limit outdoor activity during cold weather - keep follow-up appointments: 05/13/22 with Cardiology - eat healthy/prescribed diet: DASH - get at least 7 to 8 hours of sleep  at night       [employer] transportation [phone]  Call and  ask to speak with an agent 3 times to connect with a live person and arrange transportation  Dr. Delford Field- Primary Care Doctor    [facility]                                                       [address], [address]                                                       [place] [address]                                                       [phone]   Dr. Ralene Cork -Foot doctor             [facility]                                                 [address]                                                 [place] [address]                                                 [phone]   Dr. Tonia Brooms and Dr. Margarito Liner doctor    [facility]                                                              [address]                                                             [name] [address]  [phone]   Dr. Clovis Cao doctor                      [facility]                                                             [address]                                                            [place] [address]                                                            [phone]   Dr. Willodean Rosenthal doctor               [facility]                                                        [address] [place], [place] [address]                                                         [phone]

## 2022-07-26 NOTE — Progress Notes (Unsigned)
Post Hospital transition of care visit patient Office Visit  Subjective   Patient ID: James [name], male    DOB: [birth year]  Age: 62 y.o. MRN: [id number]  Chief Complaint  Patient presents with   Hospitalization Follow-up    06/29/21 Since the last visit patient's been doing well however he ran out of his losartan for 1 week.  He does complain of an itching rash on both arms.  He is painting the inside of his house he tries to stay from the out-of-doors and where the heat is.  CT of the chest was done and showed pulmonary nodules will need to be repeated in 6 months.  Pneumonia has resolved.  He has been compliant with his inhaled medications.  Breathing appears to be at baseline.  7/18 Yet another readmission since last OV: Patient had developed pneumonia increased cough shortness of breath here today for follow-up  Patient still coughing mucus white in nature some wheezing still.  Below is copy discharge summary  12/26 Patient seen as a posthospital visit and had yet another episode of pneumonia right lower lobe and required hospitalization. Below is a copy of the discharge summary and he has actually had 3 admissions now since July of this year Post Hosp visit   3 admits since 07/2021 OV Discharge date: 12/08/21 Discharge Physician: Rickey Barbara   PCP: [name], MD    Recommendations at discharge:    1.  Follow up with PCP in 1-2 weeks 2. Follow up with Cardiology as scheduled   Discharge Diagnoses: Principal Problem:   Respiratory failure (HCC) Active Problems:   COPD exacerbation (HCC)   Malnutrition of moderate degree   Non-STEMI (non-ST elevated myocardial infarction) (HCC)   Resolved Problems:   * No resolved hospital problems. *   Hospital Course: 62 year old gentleman from home, has history of COPD and chronic hypoxemic respiratory failure on 2 L oxygen at home.  He does have a history of type 2 diabetes, hypertension.  He was found with severe COPD  exacerbation secondary to rhinovirus infection, intubated and admitted to ICU.  Also found to have SVT , not Afib  1/25 Admit, intubated, started ABx, start cardizem gtt-->got hypotensive. Converted to SR but started on Neo.   11/26 Overnight transferred from [facility] ED to [facility] ICU  11/27 Remains intubated on Prop/Versed for sedation; increased secretions with high peak pressures on vent, Resp Cx sent.  11/28 Resp Cx with few gram + cocci in pairs. FiO2 needs down to 50%. Secretions improved, but ongoing breath stacking. Sedation to Precedex. Tolerating PSV. Extubated. Hypertensive, started on cleviprex + precedex  Cardiology following and diastolic CHF.  Ischemic evaluation with left heart cath planned for 12/4.   Assessment and Plan: Acute on chronic hypoxemic and hypercapnic respiratory failure: Acute COPD exacerbation due to rhinovirus infection as well as superimposed bacterial pneumonia: On baseline 2 L oxygen at home.  Extubated to 2 L of oxygen. Remained stable on transfer to floor Continued Brovana, Pulmicort and Yupelri.  On as needed bronchodilator therapy. Solu-Medrol, tapered off to prednisone Completed broad spec abx   Essential hypertension with hypertensive emergency: Initially hypotensive after intubation on vasopressors.  Then hypertensive.  Patient was on Cleviprex infusion. Currently blood pressure stabilized on blood pressure and Cozaar.  Given lasix   Recurrent SVT:  Echo with ejection fraction 50 to 55%.  Cardiology following.  Currently in sinus rhythm.  On metoprolol, aspirin.  Improved.   Type 2 diabetes poor control, steroid-induced  hyperglycemia: Fairly stable.  Not on treatment at home.   Elevated troponins with T wave depression: Respiratory status improving.  Cardiology had been following. Underwent clear heart cath 12/4. Cardiology recs for toprol xl 50mg . No need for ASA. Cardiology to f/u in 4 weeks   Clinically improving.   -Continue to work with PT OT.    -PT recommended SNF but patient reportedly felt like he can go home.   -Eager to go home   Rash -pruritic rash in gluteal region, groin, and also over L forarm (areas pt is scratching) -Rash appears fungal -continue topical antifungal   Patient still has rash in the groin area and on buttocks.  He was given topical antifungal only.  He still coughing up yellow mucus still doing some wheezing.  He needs a follow-up chest x-ray.  He has completed his course of antibiotics is now down to the 10 mg daily prednisone.  Blood pressure is elevated on arrival 157/96.  He is now on metoprolol and low-dose losartan for blood pressure.  Still maintains the oral glipizide for diabetes.  He no longer has any home care.  He is back in the original housing has had before.  He does not drink alcohol is not smoking cigarettes.  He is chewing tobacco.  He is having trouble with swallowing and choking with this.  He needs a follow-up eye exam and needs his foot examined today.  Patient also notes vision changes left eye  03/02/22 Patient seen in return follow-up has not been back to the hospital except for a brief emergency room visit end of December.  Patient COPD is at baseline.  He just saw pulmonary medicine has an FEV1 of 40% predicted and a very low diffusion capacity due to centrilobular emphysema.  He is compliant with the [place] he.  He is using oxygen at bedtime as needed during the day.  On arrival saturation 94% room air.  Patient weight is up and is a BMI 31.6.  Blood sugar on arrival 164.  Patient takes the glipizide daily.  He is not on lipids therapy needs to be started.  Blood pressure on arrival is good 128/60.  Patient is not smoking or drinking alcohol.  He needs an A1c and other labs rechecked at this visit.  There are no other complaints.  06/08/22 Patient seen and return follow-up had previously been seen in early May for COPD exacerbation however he is now improved and stable. Patient needs  refills on multiple medication oxygen is improved.  06/29/22  TOC\ Patient is seen back due to short-term follow-up related to recurrent admission for COPD exacerbation.  Below is the documentation of the discharge summary.  The patient is seen today in return follow-up post hospital.  Patient needs several medications refilled.  He still having edema in the lower extremity.  He arrives with his portable cylinder oxygen extinguished.  Patient still chewing tobacco products.  He apparently went out in the heat to walk around and this cause exacerbations. Physician Discharge Summary James [name] [id number]:[id number] DOB: July 13, 1960 DOA: 06/14/2022   PCP: [name], MD   Admit date: 06/14/2022 Discharge date: 06/21/2022 Recommendations for Outpatient Follow-up:  1. Follow up with PCP in 1-2 weeks 2. Please obtain BMP/CBC in one week 3. Please follow up with your PCP on the following pending results: Home Health: None Equipment/Devices: None   Discharge Condition: Stable  CODE STATUS: Full code Diet recommendation: Cardiac   Subjective: Seen and examined.  He  says that his breathing is much improved and he feels that he is back at baseline but he was complaining of generalized weakness.  Looks like he was seen by PT OT 2 or 3 days ago and he was thought to be at his baseline and PT did not recommend any home health PT OT.  Based on the weakness that I observed today, I offered him the assessment by PT OT because I do think that he may qualify and benefit from home health PT but patient declined.  He says that his sister will come pick him up.   Brief/Interim Summary: 62 year old with history of COPD, chronic hypoxia/hypercapnic respiratory failure, chronically on 5 L nasal cannula, recurrent pneumonia, alcohol use in remission, DM2, HTN presented to the ED with respiratory distress.  Upon admission noted to be in hypoxic and hypercapnic respiratory failure requiring BiPAP.  Chest x-ray was  negative besides multiple remote bilateral rib fractures.  Diagnosed and admitted with acute on chronic hypoxic and hypercapnic respiratory failure due to COPD exacerbation, started on Solu-Medrol and steroids, slowly improved and weaned off BiPAP.  Back at baseline now and feels comfortable going home.  Will resume PTA dose of prednisone that he was taking.   Acute kidney injury, resolved   ETOH use Did not have any withdrawal symptoms.  Was treated with Librium taper.   Multiple lung nodules on CT Multiple pulm nodules seen June 2023 had completely resolved on repeat CT in Feb 2024.  Suspicion now is that they were due to an infectious cause that resolved and not neoplastic.  Follow-up with PCP.   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   GERD - PPI   Hypertension Blood pressure controlled, resume home medications.     Discharge Diagnoses:  Principal Problem:   Acute on chronic respiratory failure with hypoxia and hypercapnia (HCC) Active Problems:   COPD with acute exacerbation (HCC)   COPD mixed type (HCC)   Hypertension   Type 2 diabetes mellitus without complication, without long-term current use of insulin (HCC)   Multiple lung nodules on CT  07/27/22 This is a transition of care visit for a 62 year old male with bronchiectasis severe COPD recurrent respiratory failure and pneumonia.  Patient was admitted yet again after he fell out of his bed injuring his left ribs developed increased respiratory distress and was admitted 8 July.  Below is documentation from that visit. Admit date: 07/12/2022 Discharge date: 07/16/2022   Admission Diagnoses:   Discharge Diagnoses:  Principal Problem:   COPD with acute exacerbation (HCC) Active Problems:   Acute on chronic respiratory failure with hypoxia and hypercapnia (HCC)   HTN (hypertension)   Type 2 diabetes  mellitus (HCC)   AKI (acute kidney injury) (HCC)   Abdominal distention   Alcohol use     Discharged Condition: stable   Hospital Course: Patient is a 62 year old male with history of COPD, chronic hypoxia on 4 L of nasal cannula, recurrent pneumonia, hypertension, GERD, type 2 diabetes mellitus and history of alcohol use.  Apparently, patient drinks about 3-4 beers daily.  Patient was admitted with COPD with exacerbation.  Patient was admitted and managed with IV steroids and nebulizer treatment.  Patient has improved.  Patient is back to baseline.  Patient will be discharged back home today to the care of the primary care provider.   Acute COPD exacerbation Possible developing pneumonia Acute on  chronic respiratory failure with hypoxemia and hypercapnia: -Patient was admitted and managed with BiPAP, steroids, nebulizer treatment and antibiotics. - BNP and procalcitonin negative. Resp Viral Panel - neg - Chest x-ray showing new patchy opacity - Patient has completed course of antibiotics. -Patient has improved significantly.  Patient is back to his baseline.  Patient be discharged back home to the care of the primary care provider.      AKI Creatinine 1.6, baseline 1.0. IV fluids.  Holding Lasix and resume losartan.  Cr stable at 0.95   Abdominal distention X-ray appears to be normal   History of alcohol use Denies ongoing alcohol use.  Alcohol withdrawal protocol   Type 2 diabetes A1c 6.2 on 06/14/2022.  Sliding scale and Accu-Cheks.  Semglee 10 units daily added   Hypertension; uncontrolled Hold lasix, resume losartan Resume Toprol.  IV as needed   Patient returns today doing well needs follow-up in this regard.  He is maintaining his inhaled medications and not drinking alcohol.  His pain is improving.  No other complaints.    Past medical history reviewed no changes made  Review of Systems  Constitutional:  Negative for chills, diaphoresis, fever, malaise/fatigue and  weight loss.  HENT:  Negative for congestion, hearing loss, nosebleeds, sore throat and tinnitus.   Eyes:  Negative for blurred vision, photophobia and redness.  Respiratory:  Positive for cough and shortness of breath. Negative for hemoptysis, sputum production, wheezing and stridor.        White  Cardiovascular:  Negative for chest pain, palpitations, orthopnea, claudication, leg swelling and PND.  Gastrointestinal:  Negative for abdominal pain, blood in stool, constipation, diarrhea, heartburn, nausea and vomiting.  Genitourinary:  Negative for dysuria, flank pain, frequency, hematuria and urgency.  Musculoskeletal:  Negative for back pain, falls, joint pain, myalgias and neck pain.  Skin:  Negative for itching and rash.  Neurological:  Negative for dizziness, tingling, tremors, sensory change, speech change, focal weakness, seizures, loss of consciousness, weakness and headaches.  Endo/Heme/Allergies:  Negative for environmental allergies and polydipsia. Does not bruise/bleed easily.  Psychiatric/Behavioral:  Negative for depression, memory loss, substance abuse and suicidal ideas. The patient is not nervous/anxious and does not have insomnia.       Objective:     BP 110/67 (BP Location: Left Arm, Patient Position: Sitting, Cuff Size: Normal)   Pulse 63   Wt 212 lb 6.4 oz (96.3 kg)   SpO2 94%   BMI 30.92 kg/m    Physical Exam Vitals reviewed.  Constitutional:      Appearance: Normal appearance. He is well-developed. He is obese. He is not diaphoretic.  HENT:     Head: Normocephalic and atraumatic.     Comments: Cushingoid facies    Nose: No nasal deformity, septal deviation, mucosal edema or rhinorrhea.     Right Sinus: No maxillary sinus tenderness or frontal sinus tenderness.     Left Sinus: No maxillary sinus tenderness or frontal sinus tenderness.     Mouth/Throat:     Pharynx: No oropharyngeal exudate.  Eyes:     General: No scleral icterus.    Conjunctiva/sclera:  Conjunctivae normal.     Pupils: Pupils are equal, round, and reactive to light.  Neck:     Thyroid: No thyromegaly.     Vascular: No carotid bruit or JVD.     Trachea: Trachea normal. No tracheal tenderness or tracheal deviation.  Cardiovascular:     Rate and Rhythm: Normal rate and regular rhythm.  Chest Wall: PMI is not displaced.     Pulses: Normal pulses. No decreased pulses.     Heart sounds: Normal heart sounds, S1 normal and S2 normal. Heart sounds not distant. No murmur heard.    No systolic murmur is present.     No diastolic murmur is present.     No friction rub. No gallop. No S3 or S4 sounds.  Pulmonary:     Effort: Pulmonary effort is normal. No tachypnea, accessory muscle usage or respiratory distress.     Breath sounds: No stridor. No decreased breath sounds, wheezing, rhonchi or rales.     Comments: Distant breath sounds Chest:     Chest wall: No tenderness.  Abdominal:     General: Bowel sounds are normal. There is no distension.     Palpations: Abdomen is soft. Abdomen is not rigid.     Tenderness: There is no abdominal tenderness. There is no guarding or rebound.  Musculoskeletal:        General: Normal range of motion.     Cervical back: Normal range of motion and neck supple. No edema, erythema or rigidity. No muscular tenderness. Normal range of motion.     Right lower leg: No edema.     Left lower leg: No edema.  Lymphadenopathy:     Head:     Right side of head: No submental or submandibular adenopathy.     Left side of head: No submental or submandibular adenopathy.     Cervical: No cervical adenopathy.  Skin:    General: Skin is warm and dry.     Coloration: Skin is not pale.     Findings: No rash.     Nails: There is no clubbing.     Comments: Erythematous rash both forearms is improved  Groin rash compatible with tinea cruris  Bilateral onychomycosis both feet  Neurological:     Mental Status: He is alert and oriented to person, place, and  time.     Sensory: No sensory deficit.  Psychiatric:        Speech: Speech normal.        Behavior: Behavior normal.      Results for orders placed or performed in visit on 07/27/22  POCT glucose (manual entry)  Result Value Ref Range   POC Glucose 283 (A) 70 - 99 mg/dl         The ASCVD Risk score (Arnett DK, et al., 2019) failed to calculate for the following reasons:   The patient has a prior MI or stroke diagnosis    Assessment & Plan:   Problem List Items Addressed This Visit       Cardiovascular and Mediastinum   HTN (hypertension)    Under control no changes        Respiratory   Chronic respiratory failure with hypoxia (HCC)    Continue oxygen therapy evaluate for POC      COPD with acute exacerbation (HCC) - Primary    Improving with steroids and oxygen and antibiotic and inhalers      RESOLVED: Acute on chronic respiratory failure with hypoxia and hypercapnia (HCC)    Improved now chronic respiratory failure along        Endocrine   Type 2 diabetes mellitus (HCC)    At goal no changes in medication      Relevant Orders   POCT glucose (manual entry) (Completed)     Genitourinary   RESOLVED: AKI (acute kidney injury) (HCC)  Resolved      Other Visit Diagnoses     Diabetes mellitus treated with oral medication (HCC)          Return in about 2 months (around 09/27/2022) for copd.    [name], MD

## 2022-07-27 ENCOUNTER — Ambulatory Visit: Payer: Medicaid Other | Attending: Critical Care Medicine | Admitting: Critical Care Medicine

## 2022-07-27 ENCOUNTER — Encounter: Payer: Self-pay | Admitting: Critical Care Medicine

## 2022-07-27 VITALS — BP 110/67 | HR 63 | Wt 212.4 lb

## 2022-07-27 DIAGNOSIS — J9611 Chronic respiratory failure with hypoxia: Secondary | ICD-10-CM | POA: Diagnosis not present

## 2022-07-27 DIAGNOSIS — N179 Acute kidney failure, unspecified: Secondary | ICD-10-CM

## 2022-07-27 DIAGNOSIS — Z7984 Long term (current) use of oral hypoglycemic drugs: Secondary | ICD-10-CM

## 2022-07-27 DIAGNOSIS — J9621 Acute and chronic respiratory failure with hypoxia: Secondary | ICD-10-CM

## 2022-07-27 DIAGNOSIS — J441 Chronic obstructive pulmonary disease with (acute) exacerbation: Secondary | ICD-10-CM | POA: Diagnosis not present

## 2022-07-27 DIAGNOSIS — J9622 Acute and chronic respiratory failure with hypercapnia: Secondary | ICD-10-CM | POA: Diagnosis not present

## 2022-07-27 DIAGNOSIS — E119 Type 2 diabetes mellitus without complications: Secondary | ICD-10-CM | POA: Diagnosis not present

## 2022-07-27 DIAGNOSIS — I10 Essential (primary) hypertension: Secondary | ICD-10-CM | POA: Diagnosis not present

## 2022-07-27 LAB — GLUCOSE, POCT (MANUAL RESULT ENTRY): POC Glucose: 283 mg/dl — AB (ref 70–99)

## 2022-07-27 NOTE — Assessment & Plan Note (Signed)
At goal no changes in medication

## 2022-07-27 NOTE — Assessment & Plan Note (Signed)
Continue oxygen therapy evaluate for POC

## 2022-07-27 NOTE — Assessment & Plan Note (Signed)
Improved now chronic respiratory failure along

## 2022-07-27 NOTE — Patient Instructions (Signed)
Finish prednisone taper then stay on one daily No other medications changes Return Dr Delford Field two months

## 2022-07-27 NOTE — Assessment & Plan Note (Signed)
Under control no changes

## 2022-07-27 NOTE — Assessment & Plan Note (Signed)
Resolved

## 2022-07-27 NOTE — Assessment & Plan Note (Signed)
Improving with steroids and oxygen and antibiotic and inhalers

## 2022-07-30 ENCOUNTER — Ambulatory Visit: Payer: Medicaid Other | Admitting: *Deleted

## 2022-08-04 ENCOUNTER — Ambulatory Visit: Payer: Medicaid Other | Admitting: Podiatry

## 2022-08-04 ENCOUNTER — Encounter: Payer: Self-pay | Admitting: Podiatry

## 2022-08-04 ENCOUNTER — Other Ambulatory Visit: Payer: Self-pay

## 2022-08-04 DIAGNOSIS — B351 Tinea unguium: Secondary | ICD-10-CM | POA: Diagnosis not present

## 2022-08-04 DIAGNOSIS — M79675 Pain in left toe(s): Secondary | ICD-10-CM | POA: Diagnosis not present

## 2022-08-04 DIAGNOSIS — E119 Type 2 diabetes mellitus without complications: Secondary | ICD-10-CM | POA: Diagnosis not present

## 2022-08-04 DIAGNOSIS — M79674 Pain in right toe(s): Secondary | ICD-10-CM | POA: Diagnosis not present

## 2022-08-04 NOTE — Progress Notes (Signed)
  Subjective:  Patient ID: James [name], male    DOB: 1960/07/15,   MRN: [id number]  Chief Complaint  Patient presents with   Nail Problem    3 mth  dfc    62 y.o. male presents for concern of thickened elongated and painful nails that are difficult to trim. Requesting to have them trimmed today. Relates burning and tingling in their feet. Patient is diabetic and last A1c was  Lab Results  Component Value Date   HGBA1C 6.2 (H) 06/14/2022   .   PCP:  [name], MD    . Denies any other pedal complaints. Denies n/v/f/c.   Past Medical History:  Diagnosis Date   Acute on chronic respiratory failure with hypoxia (HCC) 06/08/2013   Acute on chronic respiratory failure with hypoxia and hypercapnia (HCC) 06/08/2013   AKI (acute kidney injury) (HCC) 07/12/2022   Alcohol use disorder 11/23/2018   CAP (community acquired pneumonia) 05/17/2018   See admit 05/17/18 ? rml  with   covid pcr neg - rx augmentin > f/u cxr in 4-6 weeks is fine unless condition declines    Community acquired pneumonia 07/12/2021   Community acquired pneumonia of right lower lobe of lung 05/17/2018   See admit 05/17/18 ? 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 CFT <3 seconds. Absent hair growth on digits. Edema noted to bilateral lower extremities. Xerosis noted bilaterally.  Skin. No lacerations or abrasions  bilateral feet. Nails 1-5 bilateral  are thickened discolored and elongated with subungual debris. Small abrasion to plantar left foot. No erythema edema or purulence noted.  Musculoskeletal: MMT 5/5 bilateral lower extremities in DF, PF, Inversion and Eversion. Deceased ROM in DF of ankle joint.  Neurological: Sensation intact to light touch. Protective sensation diminished bilateral.    Assessment:   1. Pain due to onychomycosis of toenails of both feet   2. Type 2 diabetes mellitus without complication, without long-term current use of insulin (HCC)        Plan:  Patient was evaluated and treated and all questions answered. -Discussed and educated patient on diabetic foot care, especially with  regards to the vascular, neurological and musculoskeletal systems.  -Stressed the importance of good glycemic control and the detriment of not  controlling glucose levels in relation to the foot. -Discussed supportive shoes at all times and checking feet regularly.  -Mechanically debrided all nails 1-5 bilateral using sterile nail nipper and filed with dremel without incident  -Answered all patient questions -Patient to return  in 3 months for at risk foot care -Patient advised to call the office if any problems or questions arise in the meantime.   Louann Sjogren, DPM

## 2022-08-05 DIAGNOSIS — F79 Unspecified intellectual disabilities: Secondary | ICD-10-CM | POA: Diagnosis not present

## 2022-08-05 DIAGNOSIS — J9601 Acute respiratory failure with hypoxia: Secondary | ICD-10-CM | POA: Diagnosis not present

## 2022-08-05 DIAGNOSIS — J9621 Acute and chronic respiratory failure with hypoxia: Secondary | ICD-10-CM | POA: Diagnosis not present

## 2022-08-07 DIAGNOSIS — F79 Unspecified intellectual disabilities: Secondary | ICD-10-CM | POA: Diagnosis not present

## 2022-08-07 DIAGNOSIS — J9601 Acute respiratory failure with hypoxia: Secondary | ICD-10-CM | POA: Diagnosis not present

## 2022-08-07 DIAGNOSIS — J9621 Acute and chronic respiratory failure with hypoxia: Secondary | ICD-10-CM | POA: Diagnosis not present

## 2022-08-10 ENCOUNTER — Ambulatory Visit: Payer: Medicaid Other | Admitting: Critical Care Medicine

## 2022-08-16 ENCOUNTER — Other Ambulatory Visit: Payer: Self-pay

## 2022-08-16 ENCOUNTER — Other Ambulatory Visit: Payer: Self-pay | Admitting: Critical Care Medicine

## 2022-08-16 ENCOUNTER — Other Ambulatory Visit (HOSPITAL_COMMUNITY): Payer: Self-pay

## 2022-08-16 ENCOUNTER — Other Ambulatory Visit: Payer: Self-pay | Admitting: Internal Medicine

## 2022-08-17 ENCOUNTER — Other Ambulatory Visit (HOSPITAL_COMMUNITY): Payer: Self-pay

## 2022-08-17 MED ORDER — FOLIC ACID 1 MG PO TABS
1.0000 mg | ORAL_TABLET | Freq: Every day | ORAL | 1 refills | Status: DC
  Filled 2022-08-17: qty 90, 90d supply, fill #0

## 2022-08-17 MED ORDER — PANTOPRAZOLE SODIUM 40 MG PO TBEC
40.0000 mg | DELAYED_RELEASE_TABLET | Freq: Every day | ORAL | 1 refills | Status: DC
  Filled 2022-08-17: qty 90, 90d supply, fill #0
  Filled 2022-12-01 (×2): qty 90, 90d supply, fill #1

## 2022-08-17 MED ORDER — NYSTATIN 100000 UNIT/GM EX CREA
TOPICAL_CREAM | Freq: Three times a day (TID) | CUTANEOUS | 2 refills | Status: DC
  Filled 2022-08-17: qty 30, 10d supply, fill #0
  Filled 2022-12-06: qty 30, 10d supply, fill #1
  Filled 2023-03-22 (×2): qty 30, 10d supply, fill #2

## 2022-08-18 ENCOUNTER — Other Ambulatory Visit (HOSPITAL_COMMUNITY): Payer: Self-pay

## 2022-08-18 ENCOUNTER — Other Ambulatory Visit: Payer: Self-pay

## 2022-08-23 ENCOUNTER — Other Ambulatory Visit: Payer: Medicaid Other | Admitting: *Deleted

## 2022-08-23 ENCOUNTER — Other Ambulatory Visit: Payer: Self-pay

## 2022-08-23 ENCOUNTER — Encounter: Payer: Self-pay | Admitting: *Deleted

## 2022-08-23 NOTE — Patient Outreach (Signed)
Medicaid Managed Care   Nurse Care Manager Note  08/23/2022 Name:  James [name] MRN:  [id number] DOB:  1960-05-25  James [name] is an 62 y.o. year old male who is a primary patient of James Field Charlcie Cradle, MD.  The [facility] Managed Care Coordination team was consulted for assistance with:    COPD  James [name] was given information about Medicaid Managed Care Coordination team services today. James [name] Patient agreed to services and verbal consent obtained.  Engaged with patient by telephone for follow up visit in response to provider referral for case management and/or care coordination services.   Assessments/Interventions:  Review of past medical history, allergies, medications, health status, including review of consultants reports, laboratory and other test data, was performed as part of comprehensive evaluation and provision of chronic care management services.  SDOH (Social Determinants of Health) assessments and interventions performed: SDOH Interventions    Flowsheet Row Telephone from 07/16/2022 in [place] POPULATION HEALTH DEPARTMENT Patient Outreach Telephone from 06/30/2022 in [place] POPULATION HEALTH DEPARTMENT Patient Outreach Telephone from 05/25/2022 in [place] POPULATION HEALTH DEPARTMENT Patient Outreach Telephone from 04/19/2022 in [place] POPULATION HEALTH DEPARTMENT Patient Outreach Telephone from 04/14/2022 in [place] POPULATION HEALTH DEPARTMENT Patient Outreach Telephone from 03/19/2022 in [place] POPULATION HEALTH DEPARTMENT  SDOH Interventions        Food Insecurity Interventions -- -- -- Intervention Not Indicated -- --  Housing Interventions -- -- Intervention Not Indicated -- -- --  Transportation Interventions Payor Benefit Payor Benefit  [assisted with arranging transportation] Payor Benefit  [Assisted with arranging transportation to upcoming appointments] -- Payor Benefit  [facility] assisted with arranging transportation with UHC] Payor  Benefit  [assisted with arranging transportation to upcoming appointments]  Utilities Interventions -- -- -- Intervention Not Indicated -- --       Care Plan  No Known Allergies  Medications Reviewed Today     Reviewed by James Dach, RN (Registered Nurse) on 08/23/22 at 0913  Med List Status: <None>   Medication Order Taking? Sig Documenting Provider Last Dose Status Informant  albuterol (PROVENTIL) (2.5 MG/3ML) 0.083% nebulizer solution [id number]  Take 3 mLs (2.5 mg total) by nebulization every 4 (four) hours as needed for wheezing or shortness of breath. James Frisk, MD  Active Self, Pharmacy Records  albuterol (VENTOLIN HFA) [phone] Base) MCG/ACT inhaler [id number]  Inhale 2 puffs into the lungs every 6 (six) hours as needed for wheezing or shortness of breath. James Frisk, MD  Active Self, Pharmacy Records           Med Note ([name], James Schlein   Mon Jun 14, 2022 11:29 AM)    atorvastatin (LIPITOR) 10 MG tablet [id number]  Take 1 tablet (10 mg total) by mouth daily. James Frisk, MD  Active Self, Pharmacy Records           Med Note ([name], James [name] Jul 12, 2022  7:53 AM)    Budeson-Glycopyrrol-Formoterol (BREZTRI AEROSPHERE) 160-9-4.8 MCG/ACT [name] [id number]  Inhale 2 puffs into the lungs in the morning and at bedtime. James Person, MD  Active Pharmacy Records, Self           Med Note James [name], James [name] Jul 12, 2022  7:53 AM)    Continuous Glucose Receiver (FREESTYLE LIBRE 2 READER) DEVI [id number]  Use to check blood sugar continuously throughout the day. James Frisk, MD  Active Self, Pharmacy Records  Continuous Glucose Sensor (FREESTYLE LIBRE 2 SENSOR) MISC [id number]  Use to check blood sugar continuously throughout the day. James Frisk, MD  Active Self, Pharmacy Records  folic acid (FOLVITE) 1 MG tablet [id number]  Take 1 tablet (1 mg total) by mouth daily. James Frisk, MD  Active   furosemide (LASIX) 40 MG tablet [id number]  Take 2  tablets (80 mg total) by mouth daily for 4 days, THEN 1 tablet (40 mg total) daily.  Patient taking differently: 40 mg once daily.   James Frisk, MD  Active Self, Pharmacy Records  glipiZIDE (GLUCOTROL XL) 5 MG 24 hr tablet [id number]  Take 1 tablet (5 mg total) by mouth daily with breakfast. James Frisk, MD  Active Self, Pharmacy Records  losartan (COZAAR) 100 MG tablet [id number]  Take 1 tablet (100 mg total) by mouth daily. James Frisk, MD  Active Self, Pharmacy Records  metoprolol succinate (TOPROL-XL) 50 MG 24 hr tablet [id number]  Take 1 tablet (50 mg total) by mouth daily. Take with or immediately following a meal. James Frisk, MD  Active Self, Pharmacy Records  nicotine (NICODERM CQ - DOSED IN MG/24 HOURS) 14 mg/24hr patch [id number]  Place 1 patch (14 mg total) onto the skin daily. Barnetta Chapel, MD  Active   nystatin cream Ambrose Pancoast) [id number]  Apply to affected areas 3 (three) times daily. James Frisk, MD  Active   pantoprazole (PROTONIX) 40 MG tablet [id number]  Take 1 tablet (40 mg total) by mouth daily. James Frisk, MD  Active   pregabalin (LYRICA) 100 MG capsule [id number]  Take 1 capsule (100 mg total) by mouth 2 (two) times daily. James Frisk, MD  Active Self, Pharmacy Records            Patient Active Problem List   Diagnosis Date Noted   Alcohol use 07/12/2022   Alcohol dependence in remission (HCC) 03/02/2022   Aortic root dilation (HCC) 03/02/2022   Vision changes 12/29/2021   Oropharyngeal dysphagia 12/29/2021   Onychomycosis 12/29/2021   Malnutrition of moderate degree 12/05/2021   Tinea cruris 07/21/2021   Multiple lung nodules on CT 06/11/2021   COPD with chronic bronchitis 02/01/2021   GERD (gastroesophageal reflux disease) 02/01/2021   Neuropathy 09/29/2020   COPD with acute exacerbation (HCC) 03/26/2020   Bronchiectasis (HCC) 09/24/2019   Type 2 diabetes mellitus (HCC) 07/24/2019   Other atopic dermatitis  05/01/2019   Chronic respiratory failure with hypoxia (HCC) 04/23/2019   Intellectual disability 04/23/2019   HTN (hypertension) 05/17/2016   Former tobacco use 04/06/2016   COPD mixed type (HCC) 03/26/2016    Conditions to be addressed/monitored per PCP order:  COPD  Care Plan : RN Care Manager Plan of Care  Updates made by James Dach, RN since 08/23/2022 12:00 AM     Problem: Chronic Disease Management needs in patient with COPD   Priority: High     Long-Range Goal: Plan of care established for for Chronic Disease management for patient with COPD   Start Date: 11/13/2020  Expected End Date: 09/03/2022  Priority: High  Note:   Current Barriers:  Care Coordination needs related to Limited access to food  Chronic Disease Management support and education needs related to COPD Mr. Bach had recent visits with with Podiatry and hospital follow up with Dr. Delford Field. He ran out of prednisone 4 days ago.   RNCM Clinical Goal(s):  Patient will verbalize understanding of plan for management of COPD as  evidenced by taking all medications as prescribed, attending all scheduled appointments and contacting provider with questions and concerns take all medications exactly as prescribed and will call provider for medication related questions as evidenced by refilling medications and taking as prescribed    attend all scheduled medical appointments: 09/09/22 with PCP as evidenced by provider notes in EMR        demonstrate improved adherence to prescribed treatment plan for COPD as evidenced by taking all medications as prescribed, attending all scheduled appointments and contacting provider with questions and concerns continue to work with RN Care Manager and/or Social Worker to address care management and care coordination needs related to COPD as evidenced by adherence to CM Team Scheduled appointments     through collaboration with Medical illustrator, provider, and care team.    Interventions: Inter-disciplinary care team collaboration (see longitudinal plan of care) Evaluation of current treatment plan related to  self management and patient's adherence to plan as established by provider Ensured that patient received provided information from [facility] Discussed scheduled Esophageal Manometry, advised patient to start thinking about asking someone to go with him to this procedure   SDOH Barriers (Status:  Goal Met. Patient interviewed and SDOH assessment performed        SDOH Interventions    Flowsheet Row Patient Outreach Telephone from 06/30/2022 in [place] POPULATION HEALTH DEPARTMENT Patient Outreach Telephone from 05/25/2022 in [place] POPULATION HEALTH DEPARTMENT Patient Outreach Telephone from 04/19/2022 in [place] POPULATION HEALTH DEPARTMENT Patient Outreach Telephone from 04/14/2022 in [place] POPULATION HEALTH DEPARTMENT Patient Outreach Telephone from 03/19/2022 in [place] POPULATION HEALTH DEPARTMENT Patient Outreach Telephone from 02/17/2022 in [place] POPULATION HEALTH DEPARTMENT  SDOH Interventions        Food Insecurity Interventions -- -- Intervention Not Indicated -- -- Intervention Not Indicated  Housing Interventions -- Intervention Not Indicated -- -- -- Intervention Not Indicated  Transportation Interventions Payor Benefit  [assisted with arranging transportation] Payor Benefit  [Assisted with arranging transportation to upcoming appointments] -- Payor Benefit  [facility] assisted with arranging transportation with UHC] Payor Benefit  [assisted with arranging transportation to upcoming appointments] Payor Benefit  [Assisted with arranging transportation]  Utilities Interventions -- -- Intervention Not Indicated -- -- Other (Comment)  [Collaborated with BSW for utility assistance]        Patient interviewed and appropriate assessments performed Discussed plans with patient for ongoing care management follow up and provided patient  with direct contact information for care management team-   COPD: (Status: Goal on Track (progressing): YES.) Long Term Goal  Reviewed medications with patient, assisted with requesting needed refills Advised patient to self assesses COPD action plan zone and make appointment with provider if in the yellow zone for 48 hours without improvement Provided education about and advised patient to utilize infection prevention strategies to reduce risk of respiratory infection Discussed the importance of adequate rest and management of fatigue with COPD Infection prevention reviewed Provided encouragement to continue to avoid drinking alcohol and dipping Discussed triggers and avoiding being out in the heat during the hottest part of the day Reviewed provider notes and discussed Collaborated with Dr. Delford Field regarding prednisone   Patient Goals/Self-Care Activities: Take medications as prescribed   Attend all scheduled provider appointments Call pharmacy for medication refills 3-7 days in advance of running out of medications Perform all self care activities independently  Call provider office for new concerns or questions  - avoid second hand smoke - limit outdoor activity during  cold weather - keep follow-up appointments: 05/13/22 with Cardiology - eat healthy/prescribed diet: DASH - get at least 7 to 8 hours of sleep at night       Follow Up:  Patient agrees to Care Plan and Follow-up.  Plan: The Managed Medicaid care management team will reach out to the patient again over the next 30 days.  Date/time of next scheduled RN care management/care coordination outreach:  09/10/22 @ 9am  [name] RN, BSN [place]  Managed Medicaid RN Care Coordinator [phone]

## 2022-08-23 NOTE — Patient Instructions (Addendum)
Visit Information  James [name] was given information about Medicaid Managed Care team care coordination services as a part of their Healthy [facility] Medicaid benefit. James [name] verbally consented to engagement with the [facility] Managed Care team.   If you are experiencing a medical emergency, please call 911 or report to your local emergency department or urgent care.   If you have a non-emergency medical problem during routine business hours, please contact your provider's office and ask to speak with a nurse.   For questions related to your Healthy [facility] health plan, please call: [phone] or visit the homepage here: [email or web address]  If you would like to schedule transportation through your Healthy [facility] plan, please call the following number at least 2 days in advance of your appointment: [phone]  For information about your ride after you set it up, call Ride Assist at [phone]. Use this number to activate a Will Call pickup, or if your transportation is late for a scheduled pickup. Use this number, too, if you need to make a change or cancel a previously scheduled reservation.  If you need transportation services right away, call [phone]. The after-hours call center is staffed 24 hours to handle ride assistance and urgent reservation requests (including discharges) 365 days a year. Urgent trips include sick visits, hospital discharge requests and life-sustaining treatment.  Call the [facility] Line at [phone], at any time, 24 hours a day, 7 days a week. If you are in danger or need immediate medical attention call 911.  If you would like help to quit smoking, call 1-800-QUIT-NOW ((321)713-6510) OR Espaol: 1-855-Djelo-Ya (4-742-595-6387) o para ms informacin haga clic aqu or Text READY to 564-332 to register via text  James [name],   Please see education materials related to Esophageal Manometry  provided as print materials. I have also included a list of your providers and contact information.  The patient verbalized understanding of instructions, educational materials, and care plan provided today and agreed to receive a mailed copy of patient instructions, educational materials, and care plan.   Telephone follow up appointment with Managed Medicaid care management team member scheduled for:09/10/22 @ 9am  Dr. Delford Field- Primary Care Doctor    [facility]                                                       [address], [address]                                                       [place] [address]                                                       [phone]     Dr. Ralene Cork -Foot doctor             [facility]  [address]                                                 [place] [address]                                                 [phone]     Dr. Tonia Brooms and Dr. Margarito Liner doctor    [facility]                                                              [address]                                                             [place] [address]                                                             [phone]     Dr. Clovis Cao doctor                      [facility]                                                             [address]                                                            [place] [address]                                                            [phone]     Dr. Willodean Rosenthal doctor               [facility]  [address] [place], [place] [address]                                                          [phone]    [name] RN, BSN [place]  Managed [facility] RN Care Coordinator [phone]   Following is a copy of your plan of care:  Care Plan : RN Care Manager Plan of Care  Updates made by [name], RN since 08/23/2022 12:00 AM     Problem: Chronic Disease Management needs in patient with COPD   Priority: High     Long-Range Goal: Plan of care established for for Chronic Disease management for patient with COPD   Start Date: 11/13/2020  Expected End Date: 09/03/2022  Priority: High  Note:   Current Barriers:  Care Coordination needs related to Limited access to food  Chronic Disease Management support and education needs related to COPD James [name] had recent visits with with Podiatry and hospital follow up with Dr. Delford Field. He ran out of prednisone 4 days ago.   RNCM Clinical Goal(s):  Patient will verbalize understanding of plan for management of COPD as evidenced by taking all medications as prescribed, attending all scheduled appointments and contacting provider with questions and concerns take all medications exactly as prescribed and will call provider for medication related questions as evidenced by refilling medications and taking as prescribed    attend all scheduled medical appointments: 09/09/22 with PCP as evidenced by provider notes in EMR        demonstrate improved adherence to prescribed treatment plan for COPD as evidenced by taking all medications as prescribed, attending all scheduled appointments and contacting provider with questions and concerns continue to work with RN Care Manager and/or Social Worker to address care management and care coordination needs related to COPD as evidenced by adherence to CM Team Scheduled appointments     through collaboration with Medical illustrator, provider, and care team.   Interventions: Inter-disciplinary care team collaboration (see longitudinal plan of care) Evaluation of current  treatment plan related to  self management and patient's adherence to plan as established by provider Ensured that patient received provided information from [facility] Discussed scheduled Esophageal Manometry, advised patient to start thinking about asking someone to go with him to this procedure   SDOH Barriers (Status:  Goal Met. Patient interviewed and SDOH assessment performed        SDOH Interventions    Flowsheet Row Patient Outreach Telephone from 06/30/2022 in [place] POPULATION HEALTH DEPARTMENT Patient Outreach Telephone from 05/25/2022 in [place] POPULATION HEALTH DEPARTMENT Patient Outreach Telephone from 04/19/2022 in [place] POPULATION HEALTH DEPARTMENT Patient Outreach Telephone from 04/14/2022 in [facility] [place] POPULATION HEALTH DEPARTMENT Patient Outreach Telephone from 03/19/2022 in [place] POPULATION HEALTH DEPARTMENT Patient Outreach Telephone from 02/17/2022 in [place] POPULATION HEALTH DEPARTMENT  SDOH Interventions        Food Insecurity Interventions -- -- Intervention Not Indicated -- -- Intervention Not Indicated  Housing Interventions -- Intervention Not Indicated -- -- -- Intervention Not Indicated  Transportation Interventions Payor Benefit  [assisted with arranging transportation] Payor Benefit  [Assisted with arranging transportation to upcoming appointments] -- Payor Benefit  [facility] assisted with arranging transportation with UHC] Payor Benefit  [assisted with arranging transportation to upcoming appointments] Payor Benefit  [Assisted with arranging transportation]  Utilities Interventions -- -- Intervention Not Indicated -- -- Other (Comment)  [Collaborated with BSW for utility assistance]        Patient interviewed and appropriate assessments performed Discussed plans with patient for ongoing care management follow up and provided patient with direct contact information for care management team-   COPD: (Status: Goal on Track (progressing): YES.) Long  Term Goal  Reviewed medications with patient, assisted with requesting needed refills Advised patient to self assesses COPD action plan zone and make appointment with provider if in the yellow zone for 48 hours without improvement Provided education about and advised patient to utilize infection prevention strategies to reduce risk of respiratory infection Discussed the importance of adequate rest and management of fatigue with COPD Infection prevention reviewed Provided encouragement to continue to avoid drinking alcohol and dipping Discussed triggers and avoiding being out in the heat during the hottest part of the day Reviewed provider notes and discussed Collaborated with Dr. Delford Field regarding prednisone   Patient Goals/Self-Care Activities: Take medications as prescribed   Attend all scheduled provider appointments Call pharmacy for medication refills 3-7 days in advance of running out of medications Perform all self care activities independently  Call provider office for new concerns or questions  - avoid second hand smoke - limit outdoor activity during cold weather - keep follow-up appointments: 05/13/22 with Cardiology - eat healthy/prescribed diet: DASH - get at least 7 to 8 hours of sleep at night

## 2022-08-24 ENCOUNTER — Other Ambulatory Visit (HOSPITAL_COMMUNITY): Payer: Self-pay

## 2022-08-24 ENCOUNTER — Telehealth: Payer: Self-pay | Admitting: *Deleted

## 2022-08-24 ENCOUNTER — Telehealth: Payer: Self-pay | Admitting: Critical Care Medicine

## 2022-08-24 ENCOUNTER — Other Ambulatory Visit: Payer: Self-pay

## 2022-08-24 MED ORDER — PREDNISONE 10 MG PO TABS
10.0000 mg | ORAL_TABLET | Freq: Every day | ORAL | 1 refills | Status: DC
  Filled 2022-08-24: qty 90, 90d supply, fill #0
  Filled 2022-08-24: qty 30, 30d supply, fill #0
  Filled 2022-09-20: qty 30, 30d supply, fill #1
  Filled 2022-10-25 (×2): qty 30, 30d supply, fill #2
  Filled 2022-12-01 (×2): qty 30, 30d supply, fill #3

## 2022-08-24 NOTE — Telephone Encounter (Signed)
Please let mr Robinette know prednisone Rx refill was issued to our pharmacy

## 2022-08-24 NOTE — Telephone Encounter (Signed)
-----   Message from Nurse Shawna Orleans R sent at 08/23/2022  9:18 AM EDT ----- Regarding: Medication Hi Dr. Delford Field,  Mr. Jankiewicz ran out of prednisone last Thursday. This medication is not showing up in his med list. Do you want him to continue taking Prednisone 10 mg daily? If so, please send a new prescription. Thank you!  [name] RN, BSN [place]  Managed [facility] RN Care Coordinator [phone]

## 2022-08-24 NOTE — Patient Outreach (Signed)
  Care Management   Note  08/24/2022 Name: ANATOLI NOLA MRN: [id number] DOB: January 12, 1960  [name] is enrolled in a Managed Medicaid plan: Yes. Outreach attempt today was successful.   RNCM telephone outreach to Mr. Adler informing him that Dr. Delford Field had sent in the prescription for prednisone as requested. Mr. Cassada aware and had already contact the pharmacy to request delivery. No other needs at this time.   [name] RN, BSN [place]  Managed [facility] RN Care Coordinator [phone]

## 2022-09-05 DIAGNOSIS — J9601 Acute respiratory failure with hypoxia: Secondary | ICD-10-CM | POA: Diagnosis not present

## 2022-09-05 DIAGNOSIS — J9621 Acute and chronic respiratory failure with hypoxia: Secondary | ICD-10-CM | POA: Diagnosis not present

## 2022-09-05 DIAGNOSIS — F79 Unspecified intellectual disabilities: Secondary | ICD-10-CM | POA: Diagnosis not present

## 2022-09-07 ENCOUNTER — Other Ambulatory Visit: Payer: Medicaid Other | Admitting: *Deleted

## 2022-09-07 DIAGNOSIS — F79 Unspecified intellectual disabilities: Secondary | ICD-10-CM | POA: Diagnosis not present

## 2022-09-07 DIAGNOSIS — J9601 Acute respiratory failure with hypoxia: Secondary | ICD-10-CM | POA: Diagnosis not present

## 2022-09-07 DIAGNOSIS — J9621 Acute and chronic respiratory failure with hypoxia: Secondary | ICD-10-CM | POA: Diagnosis not present

## 2022-09-07 NOTE — Patient Outreach (Signed)
Care Coordination  09/07/2022  ASEEL LAWS 01/12/1960 [id number]   RNCM returning call to Mr. Doswell. Mr. Cassada needed clarification regarding upcoming appointments. He also needs assistance with arranging transportation. RNCM provided patient with upcoming appointments and will reach out at next scheduled appointment on 09/10/22 for assistance with transportation. Mr. Brester voiced understanding.  [name] RN, BSN [place]  Value-Based Care [facility] RN Care Coordinator [phone]

## 2022-09-09 ENCOUNTER — Ambulatory Visit: Payer: Medicaid Other | Admitting: Critical Care Medicine

## 2022-09-10 ENCOUNTER — Other Ambulatory Visit: Payer: Medicaid Other | Admitting: *Deleted

## 2022-09-10 NOTE — Patient Outreach (Signed)
Medicaid Managed Care   Nurse Care Manager Note  09/10/2022 Name:  James [name] MRN:  [id number] DOB:  25-Oct-1960  James [name] is an 62 y.o. year old male who is a primary patient of Delford Field Charlcie Cradle, MD.  The [facility] Managed Care Coordination team was consulted for assistance with:    COPD  James [name] was given information about Medicaid Managed Care Coordination team services today. James [name] Patient agreed to services and verbal consent obtained.  Engaged with patient by telephone for follow up visit in response to provider referral for case management and/or care coordination services.   Assessments/Interventions:  Review of past medical history, allergies, medications, health status, including review of consultants reports, laboratory and other test data, was performed as part of comprehensive evaluation and provision of chronic care management services.  SDOH (Social Determinants of Health) assessments and interventions performed: SDOH Interventions    Flowsheet Row Telephone from 07/16/2022 in [place] POPULATION HEALTH DEPARTMENT Patient Outreach Telephone from 06/30/2022 in [place] POPULATION HEALTH DEPARTMENT Patient Outreach Telephone from 05/25/2022 in [place] POPULATION HEALTH DEPARTMENT Patient Outreach Telephone from 04/19/2022 in [place] POPULATION HEALTH DEPARTMENT Patient Outreach Telephone from 04/14/2022 in [place] POPULATION HEALTH DEPARTMENT Patient Outreach Telephone from 03/19/2022 in [place] POPULATION HEALTH DEPARTMENT  SDOH Interventions        Food Insecurity Interventions -- -- -- Intervention Not Indicated -- --  Housing Interventions -- -- Intervention Not Indicated -- -- --  Transportation Interventions Payor Benefit Payor Benefit  [assisted with arranging transportation] Payor Benefit  [Assisted with arranging transportation to upcoming appointments] -- Payor Benefit  [facility] assisted with arranging transportation with [employer]] Payor Benefit   [assisted with arranging transportation to upcoming appointments]  Utilities Interventions -- -- -- Intervention Not Indicated -- --       Care Plan  No Known Allergies  Medications Reviewed Today     Reviewed by [name], RN (Registered Nurse) on 09/10/22 at 0940  Med List Status: <None>   Medication Order Taking? Sig Documenting Provider Last Dose Status Informant  albuterol (PROVENTIL) (2.5 MG/3ML) 0.083% nebulizer solution [id number] Yes Take 3 mLs (2.5 mg total) by nebulization every 4 (four) hours as needed for wheezing or shortness of breath. [name], MD  Active Self, Pharmacy Records  albuterol (VENTOLIN HFA) [phone] Base) MCG/ACT inhaler [id number] Yes Inhale 2 puffs into the lungs every 6 (six) hours as needed for wheezing or shortness of breath. [name], MD  Active Self, Pharmacy Records           Med Note (COFFELL, Marzella Schlein   Mon Jun 14, 2022 11:29 AM)    atorvastatin (LIPITOR) 10 MG tablet [id number] Yes Take 1 tablet (10 mg total) by mouth daily. [name], MD  Active Self, Pharmacy Records           Med Note Epimenio Sarin, Carlota Raspberry Jul 12, 2022  7:53 AM)    Budeson-Glycopyrrol-Formoterol [facility] AEROSPHERE) 160-9-4.8 MCG/ACT [name] [id number] Yes Inhale 2 puffs into the lungs in the morning and at bedtime. Omar Person, MD  Active Pharmacy Records, Self           Med Note Epimenio Sarin, Carlota Raspberry Jul 12, 2022  7:53 AM)    Continuous Glucose Receiver (FREESTYLE LIBRE 2 READER) DEVI [id number]  Use to check blood sugar continuously throughout the day.  Patient not taking: Reported on 08/23/2022   Delford Field,  Charlcie Cradle, MD  Active Self, Pharmacy Records  Continuous Glucose Sensor (FREESTYLE LIBRE 2 SENSOR) [place] [id number]  Use to check blood sugar continuously throughout the day.  Patient not taking: Reported on 08/23/2022   [name], MD  Active Self, Pharmacy Records  folic acid (FOLVITE) 1 MG tablet [id number] Yes Take 1 tablet (1 mg  total) by mouth daily. [name], MD  Active   furosemide (LASIX) 40 MG tablet [id number] Yes Take 2 tablets (80 mg total) by mouth daily for 4 days, THEN 1 tablet (40 mg total) daily.  Patient taking differently: 40 mg once daily.   [name], MD  Active Self, Pharmacy Records  glipiZIDE (GLUCOTROL XL) 5 MG 24 hr tablet [id number] Yes Take 1 tablet (5 mg total) by mouth daily with breakfast. [name], MD  Active Self, Pharmacy Records  losartan (COZAAR) 100 MG tablet [id number] Yes Take 1 tablet (100 mg total) by mouth daily. [name], MD  Active Self, Pharmacy Records  metoprolol succinate (TOPROL-XL) 50 MG 24 hr tablet [id number] Yes Take 1 tablet (50 mg total) by mouth daily. Take with or immediately following a meal. [name], MD  Active Self, Pharmacy Records  nicotine (NICODERM CQ - DOSED IN MG/24 HOURS) 14 mg/24hr patch [id number]  Place 1 patch (14 mg total) onto the skin daily. Barnetta Chapel, MD  Active   nystatin cream Ambrose Pancoast) [id number]  Apply to affected areas 3 (three) times daily. [name], MD  Active   pantoprazole (PROTONIX) 40 MG tablet [id number] Yes Take 1 tablet (40 mg total) by mouth daily. [name], MD  Active   predniSONE (DELTASONE) 10 MG tablet [id number] Yes Take 1 tablet (10 mg total) by mouth daily with breakfast. [name], MD  Active   pregabalin (LYRICA) 100 MG capsule [id number]  Take 1 capsule (100 mg total) by mouth 2 (two) times daily. [name], MD  Active Self, Pharmacy Records            Patient Active Problem List   Diagnosis Date Noted   Alcohol use 07/12/2022   Alcohol dependence in remission (HCC) 03/02/2022   Aortic root dilation (HCC) 03/02/2022   Vision changes 12/29/2021   Oropharyngeal dysphagia 12/29/2021   Onychomycosis 12/29/2021   Malnutrition of moderate degree 12/05/2021   Tinea cruris 07/21/2021   Multiple lung nodules on CT 06/11/2021   COPD with chronic  bronchitis 02/01/2021   GERD (gastroesophageal reflux disease) 02/01/2021   Neuropathy 09/29/2020   COPD with acute exacerbation (HCC) 03/26/2020   Bronchiectasis (HCC) 09/24/2019   Type 2 diabetes mellitus (HCC) 07/24/2019   Other atopic dermatitis 05/01/2019   Chronic respiratory failure with hypoxia (HCC) 04/23/2019   Intellectual disability 04/23/2019   HTN (hypertension) 05/17/2016   Former tobacco use 04/06/2016   COPD mixed type (HCC) 03/26/2016    Conditions to be addressed/monitored per PCP order:  COPD  Care Plan : RN Care Manager Plan of Care  Updates made by [name], RN since 09/10/2022 12:00 AM     Problem: Chronic Disease Management needs in patient with COPD   Priority: High     Long-Range Goal: Plan of care established for for Chronic Disease management for patient with COPD   Start Date: 11/13/2020  Expected End Date: 12/03/2022  Priority: High  Note:   Current Barriers:  Care Coordination needs related to Limited access to food  Chronic Disease Management support and education  needs related to COPD    RNCM Clinical Goal(s):  Patient will verbalize understanding of plan for management of COPD as evidenced by taking all medications as prescribed, attending all scheduled appointments and contacting provider with questions and concerns take all medications exactly as prescribed and will call provider for medication related questions as evidenced by refilling medications and taking as prescribed    attend all scheduled medical appointments: 09/09/22 with PCP as evidenced by provider notes in EMR        demonstrate improved adherence to prescribed treatment plan for COPD as evidenced by taking all medications as prescribed, attending all scheduled appointments and contacting provider with questions and concerns continue to work with RN Care Manager and/or Social Worker to address care management and care coordination needs related to COPD as evidenced by  adherence to CM Team Scheduled appointments     through collaboration with RN Care manager, provider, and care team.   Interventions: Inter-disciplinary care team collaboration (see longitudinal plan of care) Evaluation of current treatment plan related to  self management and patient's adherence to plan as established by provider Assisted patient with scheduling transportation to Endoscopy procedure on 09/15/22, pick up time 10:10am, patient will call for pick up to return home; 09/28/22 with PCP pick up time 12:50 from home, patient will call for return ride home.   COPD: (Status: Goal on Track (progressing): YES.) Long Term Goal  Reviewed medications with patient, assisted with requesting needed refills Advised patient to self assesses COPD action plan zone and make appointment with provider if in the yellow zone for 48 hours without improvement Provided education about and advised patient to utilize infection prevention strategies to reduce risk of respiratory infection Discussed the importance of adequate rest and management of fatigue with COPD Provided encouragement to continue to avoid drinking alcohol and dipping Discussed triggers and avoiding being out in the heat during the hottest part of the day Discussed the importance of handwashing and wearing a mask in public, explained the importance of infection prevention  Patient Goals/Self-Care Activities: Take medications as prescribed   Attend all scheduled provider appointments Call pharmacy for medication refills 3-7 days in advance of running out of medications Perform all self care activities independently  Call provider office for new concerns or questions  - avoid second hand smoke - limit outdoor activity during cold weather - keep follow-up appointments: 05/13/22 with Cardiology - eat healthy/prescribed diet: DASH - get at least 7 to 8 hours of sleep at night       Follow Up:  Patient agrees to Care Plan and  Follow-up.  Plan: The Managed Medicaid care management team will reach out to the patient again over the next 30 days.  Date/time of next scheduled RN care management/care coordination outreach:  10/13/22 @ 9am  [name] RN, BSN [place]  Value-Based Care [facility] Health RN Care Coordinator [phone]

## 2022-09-10 NOTE — Patient Instructions (Signed)
Visit Information  James [name] was given information about Medicaid Managed Care team care coordination services as a part of their Healthy [facility] Medicaid benefit. James [name] verbally consented to engagement with the [facility] Managed Care team.   If you are experiencing a medical emergency, please call 911 or report to your local emergency department or urgent care.   If you have a non-emergency medical problem during routine business hours, please contact your provider's office and ask to speak with a nurse.   For questions related to your Healthy [facility] health plan, please call: [phone] or visit the homepage here: [email or web address]  If you would like to schedule transportation through your Healthy [facility] plan, please call the following number at least 2 days in advance of your appointment: [phone]  For information about your ride after you set it up, call Ride Assist at [phone]. Use this number to activate a Will Call pickup, or if your transportation is late for a scheduled pickup. Use this number, too, if you need to make a change or cancel a previously scheduled reservation.  If you need transportation services right away, call [phone]. The after-hours call center is staffed 24 hours to handle ride assistance and urgent reservation requests (including discharges) 365 days a year. Urgent trips include sick visits, hospital discharge requests and life-sustaining treatment.  Call the [facility] Line at [phone], at any time, 24 hours a day, 7 days a week. If you are in danger or need immediate medical attention call 911.  If you would like help to quit smoking, call 1-800-QUIT-NOW ((435)497-1156) OR Espaol: 1-855-Djelo-Ya (5-188-416-6063) o para ms informacin haga clic aqu or Text READY to 016-010 to register via text  James [name],   Please see education materials related to infection prevention  provided as print materials.   The patient verbalized understanding of instructions, educational materials, and care plan provided today and agreed to receive a mailed copy of patient instructions, educational materials, and care plan.   Telephone follow up appointment with Managed Medicaid care management team member scheduled for:10/13/22 @ 9am  [name] RN, BSN [place]  Value-Based Care [facility] Health RN Care Coordinator [phone]   Following is a copy of your plan of care:  Care Plan : RN Care Manager Plan of Care  Updates made by [name], RN since 09/10/2022 12:00 AM     Problem: Chronic Disease Management needs in patient with COPD   Priority: High     Long-Range Goal: Plan of care established for for Chronic Disease management for patient with COPD   Start Date: 11/13/2020  Expected End Date: 12/03/2022  Priority: High  Note:   Current Barriers:  Care Coordination needs related to Limited access to food  Chronic Disease Management support and education needs related to COPD    RNCM Clinical Goal(s):  Patient will verbalize understanding of plan for management of COPD as evidenced by taking all medications as prescribed, attending all scheduled appointments and contacting provider with questions and concerns take all medications exactly as prescribed and will call provider for medication related questions as evidenced by refilling medications and taking as prescribed    attend all scheduled medical appointments: 09/09/22 with PCP as evidenced by provider notes in EMR        demonstrate improved adherence to prescribed treatment plan for COPD as evidenced by taking all medications as prescribed, attending all scheduled appointments and contacting provider with questions and concerns continue to work  with RN Care Manager and/or Social Worker to address care management and care coordination needs related to COPD as evidenced by adherence to CM Team  Scheduled appointments     through collaboration with RN Care manager, provider, and care team.   Interventions: Inter-disciplinary care team collaboration (see longitudinal plan of care) Evaluation of current treatment plan related to  self management and patient's adherence to plan as established by provider Assisted patient with scheduling transportation to Endoscopy procedure on 09/15/22, pick up time 10:10am, patient will call for pick up to return home; 09/28/22 with PCP pick up time 12:50 from home, patient will call for return ride home.   COPD: (Status: Goal on Track (progressing): YES.) Long Term Goal  Reviewed medications with patient, assisted with requesting needed refills Advised patient to self assesses COPD action plan zone and make appointment with provider if in the yellow zone for 48 hours without improvement Provided education about and advised patient to utilize infection prevention strategies to reduce risk of respiratory infection Discussed the importance of adequate rest and management of fatigue with COPD Provided encouragement to continue to avoid drinking alcohol and dipping Discussed triggers and avoiding being out in the heat during the hottest part of the day Discussed the importance of handwashing and wearing a mask in public, explained the importance of infection prevention  Patient Goals/Self-Care Activities: Take medications as prescribed   Attend all scheduled provider appointments Call pharmacy for medication refills 3-7 days in advance of running out of medications Perform all self care activities independently  Call provider office for new concerns or questions  - avoid second hand smoke - limit outdoor activity during cold weather - keep follow-up appointments: 05/13/22 with Cardiology - eat healthy/prescribed diet: DASH - get at least 7 to 8 hours of sleep at night

## 2022-09-15 ENCOUNTER — Encounter (HOSPITAL_COMMUNITY): Payer: Self-pay | Admitting: Internal Medicine

## 2022-09-15 ENCOUNTER — Encounter (HOSPITAL_COMMUNITY)
Admission: RE | Disposition: A | Discharge status: Home / Self Care | Payer: Self-pay | Source: Home / Self Care | Attending: Internal Medicine

## 2022-09-15 ENCOUNTER — Ambulatory Visit (HOSPITAL_COMMUNITY)
Admission: RE | Admit: 2022-09-15 | Discharge: 2022-09-15 | Disposition: A | Discharge status: Home / Self Care | Payer: Medicaid Other | Attending: Internal Medicine | Admitting: Internal Medicine

## 2022-09-15 DIAGNOSIS — R1310 Dysphagia, unspecified: Secondary | ICD-10-CM | POA: Diagnosis not present

## 2022-09-15 HISTORY — PX: ESOPHAGEAL MANOMETRY: SHX5429

## 2022-09-15 SURGERY — MANOMETRY, ESOPHAGUS
Anesthesia: Choice

## 2022-09-15 MED ORDER — LIDOCAINE VISCOUS HCL 2 % MT SOLN
OROMUCOSAL | Status: AC
  Filled 2022-09-15: qty 15

## 2022-09-15 SURGICAL SUPPLY — 2 items
FACESHIELD LNG OPTICON STERILE (SAFETY) IMPLANT
GLOVE BIO SURGEON STRL SZ8 (GLOVE) ×2 IMPLANT

## 2022-09-15 NOTE — Progress Notes (Signed)
Esophageal manometry performed per protocol without complications.  Patient tolerated well. 

## 2022-09-19 ENCOUNTER — Encounter (HOSPITAL_COMMUNITY): Payer: Self-pay | Admitting: Internal Medicine

## 2022-09-20 ENCOUNTER — Other Ambulatory Visit: Payer: Self-pay

## 2022-09-20 ENCOUNTER — Other Ambulatory Visit (HOSPITAL_COMMUNITY): Payer: Self-pay

## 2022-09-22 ENCOUNTER — Other Ambulatory Visit (HOSPITAL_COMMUNITY): Payer: Self-pay

## 2022-09-27 ENCOUNTER — Other Ambulatory Visit (HOSPITAL_COMMUNITY): Payer: Self-pay

## 2022-09-27 ENCOUNTER — Other Ambulatory Visit: Payer: Self-pay

## 2022-09-27 NOTE — Progress Notes (Unsigned)
Post Hospital transition of care visit patient Office Visit  Subjective   Patient ID: James [name], male    DOB: 20-Nov-1960  Age: 62 y.o. MRN: [id number]  No chief complaint on file.   06/29/21 Since the last visit patient's been doing well however he ran out of his losartan for 1 week.  He does complain of an itching rash on both arms.  He is painting the inside of his house he tries to stay from the out-of-doors and where the heat is.  CT of the chest was done and showed pulmonary nodules will need to be repeated in 6 months.  Pneumonia has resolved.  He has been compliant with his inhaled medications.  Breathing appears to be at baseline.  7/18 Yet another readmission since last OV: Patient had developed pneumonia increased cough shortness of breath here today for follow-up  Patient still coughing mucus white in nature some wheezing still.  Below is copy discharge summary  12/26 Patient seen as a posthospital visit and had yet another episode of pneumonia right lower lobe and required hospitalization. Below is a copy of the discharge summary and he has actually had 3 admissions now since July of this year Post Hosp visit   3 admits since 07/2021 OV Discharge date: 12/08/21 Discharge Physician: Rickey Barbara   PCP: [name], MD    Recommendations at discharge:    1.  Follow up with PCP in 1-2 weeks 2. Follow up with Cardiology as scheduled   Discharge Diagnoses: Principal Problem:   Respiratory failure (HCC) Active Problems:   COPD exacerbation (HCC)   Malnutrition of moderate degree   Non-STEMI (non-ST elevated myocardial infarction) (HCC)   Resolved Problems:   * No resolved hospital problems. *   Hospital Course: 62 year old gentleman from home, has history of COPD and chronic hypoxemic respiratory failure on 2 L oxygen at home.  He does have a history of type 2 diabetes, hypertension.  He was found with severe COPD exacerbation secondary to rhinovirus  infection, intubated and admitted to ICU.  Also found to have SVT , not Afib  1/25 Admit, intubated, started ABx, start cardizem gtt-->got hypotensive. Converted to SR but started on Neo.   11/26 Overnight transferred from [facility] ED to [facility] ICU  11/27 Remains intubated on Prop/Versed for sedation; increased secretions with high peak pressures on vent, Resp Cx sent.  11/28 Resp Cx with few gram + cocci in pairs. FiO2 needs down to 50%. Secretions improved, but ongoing breath stacking. Sedation to Precedex. Tolerating PSV. Extubated. Hypertensive, started on cleviprex + precedex  Cardiology following and diastolic CHF.  Ischemic evaluation with left heart cath planned for 12/4.   Assessment and Plan: Acute on chronic hypoxemic and hypercapnic respiratory failure: Acute COPD exacerbation due to rhinovirus infection as well as superimposed bacterial pneumonia: On baseline 2 L oxygen at home.  Extubated to 2 L of oxygen. Remained stable on transfer to floor Continued Brovana, Pulmicort and Yupelri.  On as needed bronchodilator therapy. Solu-Medrol, tapered off to prednisone Completed broad spec abx   Essential hypertension with hypertensive emergency: Initially hypotensive after intubation on vasopressors.  Then hypertensive.  Patient was on Cleviprex infusion. Currently blood pressure stabilized on blood pressure and Cozaar.  Given lasix   Recurrent SVT:  Echo with ejection fraction 50 to 55%.  Cardiology following.  Currently in sinus rhythm.  On metoprolol, aspirin.  Improved.   Type 2 diabetes poor control, steroid-induced hyperglycemia: Fairly stable.  Not on  treatment at home.   Elevated troponins with T wave depression: Respiratory status improving.  Cardiology had been following. Underwent clear heart cath 12/4. Cardiology recs for toprol xl 50mg . No need for ASA. Cardiology to f/u in 4 weeks   Clinically improving.   -Continue to work with PT OT.   -PT recommended SNF but patient  reportedly felt like he can go home.   -Eager to go home   Rash -pruritic rash in gluteal region, groin, and also over L forarm (areas pt is scratching) -Rash appears fungal -continue topical antifungal   Patient still has rash in the groin area and on buttocks.  He was given topical antifungal only.  He still coughing up yellow mucus still doing some wheezing.  He needs a follow-up chest x-ray.  He has completed his course of antibiotics is now down to the 10 mg daily prednisone.  Blood pressure is elevated on arrival 157/96.  He is now on metoprolol and low-dose losartan for blood pressure.  Still maintains the oral glipizide for diabetes.  He no longer has any home care.  He is back in the original housing has had before.  He does not drink alcohol is not smoking cigarettes.  He is chewing tobacco.  He is having trouble with swallowing and choking with this.  He needs a follow-up eye exam and needs his foot examined today.  Patient also notes vision changes left eye  03/02/22 Patient seen in return follow-up has not been back to the hospital except for a brief emergency room visit end of December.  Patient COPD is at baseline.  He just saw pulmonary medicine has an FEV1 of 40% predicted and a very low diffusion capacity due to centrilobular emphysema.  He is compliant with the [place] he.  He is using oxygen at bedtime as needed during the day.  On arrival saturation 94% room air.  Patient weight is up and is a BMI 31.6.  Blood sugar on arrival 164.  Patient takes the glipizide daily.  He is not on lipids therapy needs to be started.  Blood pressure on arrival is good 128/60.  Patient is not smoking or drinking alcohol.  He needs an A1c and other labs rechecked at this visit.  There are no other complaints.  06/08/22 Patient seen and return follow-up had previously been seen in early May for COPD exacerbation however he is now improved and stable. Patient needs refills on multiple medication  oxygen is improved.  06/29/22  TOC\ Patient is seen back due to short-term follow-up related to recurrent admission for COPD exacerbation.  Below is the documentation of the discharge summary.  The patient is seen today in return follow-up post hospital.  Patient needs several medications refilled.  He still having edema in the lower extremity.  He arrives with his portable cylinder oxygen extinguished.  Patient still chewing tobacco products.  He apparently went out in the heat to walk around and this cause exacerbations. Physician Discharge Summary THIBAULT ULLOM ONG:[id number] DOB: [birth year] DOA: 06/14/2022   PCP: [name], MD   Admit date: 06/14/2022 Discharge date: 06/21/2022 Recommendations for Outpatient Follow-up:  1. Follow up with PCP in 1-2 weeks 2. Please obtain BMP/CBC in one week 3. Please follow up with your PCP on the following pending results: Home Health: None Equipment/Devices: None   Discharge Condition: Stable  CODE STATUS: Full code Diet recommendation: Cardiac   Subjective: Seen and examined.  He says that his breathing is much  improved and he feels that he is back at baseline but he was complaining of generalized weakness.  Looks like he was seen by PT OT 2 or 3 days ago and he was thought to be at his baseline and PT did not recommend any home health PT OT.  Based on the weakness that I observed today, I offered him the assessment by PT OT because I do think that he may qualify and benefit from home health PT but patient declined.  He says that his sister will come pick him up.   Brief/Interim Summary: 62 year old with history of COPD, chronic hypoxia/hypercapnic respiratory failure, chronically on 5 L nasal cannula, recurrent pneumonia, alcohol use in remission, DM2, HTN presented to the ED with respiratory distress.  Upon admission noted to be in hypoxic and hypercapnic respiratory failure requiring BiPAP.  Chest x-ray was negative besides multiple remote  bilateral rib fractures.  Diagnosed and admitted with acute on chronic hypoxic and hypercapnic respiratory failure due to COPD exacerbation, started on Solu-Medrol and steroids, slowly improved and weaned off BiPAP.  Back at baseline now and feels comfortable going home.  Will resume PTA dose of prednisone that he was taking.   Acute kidney injury, resolved   ETOH use Did not have any withdrawal symptoms.  Was treated with Librium taper.   Multiple lung nodules on CT Multiple pulm nodules seen June 2023 had completely resolved on repeat CT in Feb 2024.  Suspicion now is that they were due to an infectious cause that resolved and not neoplastic.  Follow-up with PCP.   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   GERD - PPI   Hypertension Blood pressure controlled, resume home medications.     Discharge Diagnoses:  Principal Problem:   Acute on chronic respiratory failure with hypoxia and hypercapnia (HCC) Active Problems:   COPD with acute exacerbation (HCC)   COPD mixed type (HCC)   Hypertension   Type 2 diabetes mellitus without complication, without long-term current use of insulin (HCC)   Multiple lung nodules on CT  07/27/22 This is a transition of care visit for a 62 year old male with bronchiectasis severe COPD recurrent respiratory failure and pneumonia.  Patient was admitted yet again after he fell out of his bed injuring his left ribs developed increased respiratory distress and was admitted 8 July.  Below is documentation from that visit. Admit date: 07/12/2022 Discharge date: 07/16/2022   Admission Diagnoses:   Discharge Diagnoses:  Principal Problem:   COPD with acute exacerbation (HCC) Active Problems:   Acute on chronic respiratory failure with hypoxia and hypercapnia (HCC)   HTN (hypertension)   Type 2 diabetes mellitus (HCC)   AKI (acute kidney  injury) (HCC)   Abdominal distention   Alcohol use     Discharged Condition: stable   Hospital Course: Patient is a 62 year old male with history of COPD, chronic hypoxia on 4 L of nasal cannula, recurrent pneumonia, hypertension, GERD, type 2 diabetes mellitus and history of alcohol use.  Apparently, patient drinks about 3-4 beers daily.  Patient was admitted with COPD with exacerbation.  Patient was admitted and managed with IV steroids and nebulizer treatment.  Patient has improved.  Patient is back to baseline.  Patient will be discharged back home today to the care of the primary care provider.   Acute COPD exacerbation Possible developing pneumonia Acute on chronic respiratory failure with hypoxemia and  hypercapnia: -Patient was admitted and managed with BiPAP, steroids, nebulizer treatment and antibiotics. - BNP and procalcitonin negative. Resp Viral Panel - neg - Chest x-ray showing new patchy opacity - Patient has completed course of antibiotics. -Patient has improved significantly.  Patient is back to his baseline.  Patient be discharged back home to the care of the primary care provider.      AKI Creatinine 1.6, baseline 1.0. IV fluids.  Holding Lasix and resume losartan.  Cr stable at 0.95   Abdominal distention X-ray appears to be normal   History of alcohol use Denies ongoing alcohol use.  Alcohol withdrawal protocol   Type 2 diabetes A1c 6.2 on 06/14/2022.  Sliding scale and Accu-Cheks.  Semglee 10 units daily added   Hypertension; uncontrolled Hold lasix, resume losartan Resume Toprol.  IV as needed   Patient returns today doing well needs follow-up in this regard.  He is maintaining his inhaled medications and not drinking alcohol.  His pain is improving.  No other complaints.  09/28/22   Review of Systems  Constitutional:  Negative for chills, diaphoresis, fever, malaise/fatigue and weight loss.  HENT:  Negative for congestion, hearing loss, nosebleeds, sore  throat and tinnitus.   Eyes:  Negative for blurred vision, photophobia and redness.  Respiratory:  Positive for cough and shortness of breath. Negative for hemoptysis, sputum production, wheezing and stridor.        White  Cardiovascular:  Negative for chest pain, palpitations, orthopnea, claudication, leg swelling and PND.  Gastrointestinal:  Negative for abdominal pain, blood in stool, constipation, diarrhea, heartburn, nausea and vomiting.  Genitourinary:  Negative for dysuria, flank pain, frequency, hematuria and urgency.  Musculoskeletal:  Negative for back pain, falls, joint pain, myalgias and neck pain.  Skin:  Negative for itching and rash.  Neurological:  Negative for dizziness, tingling, tremors, sensory change, speech change, focal weakness, seizures, loss of consciousness, weakness and headaches.  Endo/Heme/Allergies:  Negative for environmental allergies and polydipsia. Does not bruise/bleed easily.  Psychiatric/Behavioral:  Negative for depression, memory loss, substance abuse and suicidal ideas. The patient is not nervous/anxious and does not have insomnia.       Objective:     There were no vitals taken for this visit.   Physical Exam Vitals reviewed.  Constitutional:      Appearance: Normal appearance. He is well-developed. He is obese. He is not diaphoretic.  HENT:     Head: Normocephalic and atraumatic.     Comments: Cushingoid facies    Nose: No nasal deformity, septal deviation, mucosal edema or rhinorrhea.     Right Sinus: No maxillary sinus tenderness or frontal sinus tenderness.     Left Sinus: No maxillary sinus tenderness or frontal sinus tenderness.     Mouth/Throat:     Pharynx: No oropharyngeal exudate.  Eyes:     General: No scleral icterus.    Conjunctiva/sclera: Conjunctivae normal.     Pupils: Pupils are equal, round, and reactive to light.  Neck:     Thyroid: No thyromegaly.     Vascular: No carotid bruit or JVD.     Trachea: Trachea normal.  No tracheal tenderness or tracheal deviation.  Cardiovascular:     Rate and Rhythm: Normal rate and regular rhythm.     Chest Wall: PMI is not displaced.     Pulses: Normal pulses. No decreased pulses.     Heart sounds: Normal heart sounds, S1 normal and S2 normal. Heart sounds not distant. No murmur heard.  No systolic murmur is present.     No diastolic murmur is present.     No friction rub. No gallop. No S3 or S4 sounds.  Pulmonary:     Effort: Pulmonary effort is normal. No tachypnea, accessory muscle usage or respiratory distress.     Breath sounds: No stridor. No decreased breath sounds, wheezing, rhonchi or rales.     Comments: Distant breath sounds Chest:     Chest wall: No tenderness.  Abdominal:     General: Bowel sounds are normal. There is no distension.     Palpations: Abdomen is soft. Abdomen is not rigid.     Tenderness: There is no abdominal tenderness. There is no guarding or rebound.  Musculoskeletal:        General: Normal range of motion.     Cervical back: Normal range of motion and neck supple. No edema, erythema or rigidity. No muscular tenderness. Normal range of motion.     Right lower leg: No edema.     Left lower leg: No edema.  Lymphadenopathy:     Head:     Right side of head: No submental or submandibular adenopathy.     Left side of head: No submental or submandibular adenopathy.     Cervical: No cervical adenopathy.  Skin:    General: Skin is warm and dry.     Coloration: Skin is not pale.     Findings: No rash.     Nails: There is no clubbing.     Comments: Erythematous rash both forearms is improved  Groin rash compatible with tinea cruris  Bilateral onychomycosis both feet  Neurological:     Mental Status: He is alert and oriented to person, place, and time.     Sensory: No sensory deficit.  Psychiatric:        Speech: Speech normal.        Behavior: Behavior normal.     No results found for any visits on  09/28/22.        The ASCVD Risk score (Arnett DK, et al., 2019) failed to calculate for the following reasons:   The patient has a prior MI or stroke diagnosis    Assessment & Plan:   Problem List Items Addressed This Visit   None   No follow-ups on file.    [name], MD

## 2022-09-27 NOTE — Progress Notes (Incomplete Revision)
Post Hospital transition of care visit patient Office Visit  Subjective   Patient ID: James [name], male    DOB: Mar 13, 1960  Age: 62 y.o. MRN: [id number]  Chief Complaint  Patient presents with   Medical Management of Chronic Issues    06/29/21 Since the last visit patient's been doing well however he ran out of his losartan for 1 week.  He does complain of an itching rash on both arms.  He is painting the inside of his house he tries to stay from the out-of-doors and where the heat is.  CT of the chest was done and showed pulmonary nodules will need to be repeated in 6 months.  Pneumonia has resolved.  He has been compliant with his inhaled medications.  Breathing appears to be at baseline.  7/18 Yet another readmission since last OV: Patient had developed pneumonia increased cough shortness of breath here today for follow-up  Patient still coughing mucus white in nature some wheezing still.  Below is copy discharge summary  12/26 Patient seen as a posthospital visit and had yet another episode of pneumonia right lower lobe and required hospitalization. Below is a copy of the discharge summary and he has actually had 3 admissions now since July of this year Post Hosp visit   3 admits since 07/2021 OV Discharge date: 12/08/21 Discharge Physician: Rickey Barbara   PCP: [name], MD    Recommendations at discharge:    1.  Follow up with PCP in 1-2 weeks 2. Follow up with Cardiology as scheduled   Discharge Diagnoses: Principal Problem:   Respiratory failure (HCC) Active Problems:   COPD exacerbation (HCC)   Malnutrition of moderate degree   Non-STEMI (non-ST elevated myocardial infarction) (HCC)   Resolved Problems:   * No resolved hospital problems. *   Hospital Course: 62 year old gentleman from home, has history of COPD and chronic hypoxemic respiratory failure on 2 L oxygen at home.  He does have a history of type 2 diabetes, hypertension.  He was found with  severe COPD exacerbation secondary to rhinovirus infection, intubated and admitted to ICU.  Also found to have SVT , not Afib  1/25 Admit, intubated, started ABx, start cardizem gtt-->got hypotensive. Converted to SR but started on Neo.   11/26 Overnight transferred from [facility] ED to [facility] ICU  11/27 Remains intubated on Prop/Versed for sedation; increased secretions with high peak pressures on vent, Resp Cx sent.  11/28 Resp Cx with few gram + cocci in pairs. FiO2 needs down to 50%. Secretions improved, but ongoing breath stacking. Sedation to Precedex. Tolerating PSV. Extubated. Hypertensive, started on cleviprex + precedex  Cardiology following and diastolic CHF.  Ischemic evaluation with left heart cath planned for 12/4.   Assessment and Plan: Acute on chronic hypoxemic and hypercapnic respiratory failure: Acute COPD exacerbation due to rhinovirus infection as well as superimposed bacterial pneumonia: On baseline 2 L oxygen at home.  Extubated to 2 L of oxygen. Remained stable on transfer to floor Continued Brovana, Pulmicort and Yupelri.  On as needed bronchodilator therapy. Solu-Medrol, tapered off to prednisone Completed broad spec abx   Essential hypertension with hypertensive emergency: Initially hypotensive after intubation on vasopressors.  Then hypertensive.  Patient was on Cleviprex infusion. Currently blood pressure stabilized on blood pressure and Cozaar.  Given lasix   Recurrent SVT:  Echo with ejection fraction 50 to 55%.  Cardiology following.  Currently in sinus rhythm.  On metoprolol, aspirin.  Improved.   Type 2 diabetes  Post Hospital transition of care visit patient Office Visit  Subjective   Patient ID: James [name], male    DOB: Mar 13, 1960  Age: 62 y.o. MRN: [id number]  Chief Complaint  Patient presents with   Medical Management of Chronic Issues    06/29/21 Since the last visit patient's been doing well however he ran out of his losartan for 1 week.  He does complain of an itching rash on both arms.  He is painting the inside of his house he tries to stay from the out-of-doors and where the heat is.  CT of the chest was done and showed pulmonary nodules will need to be repeated in 6 months.  Pneumonia has resolved.  He has been compliant with his inhaled medications.  Breathing appears to be at baseline.  7/18 Yet another readmission since last OV: Patient had developed pneumonia increased cough shortness of breath here today for follow-up  Patient still coughing mucus white in nature some wheezing still.  Below is copy discharge summary  12/26 Patient seen as a posthospital visit and had yet another episode of pneumonia right lower lobe and required hospitalization. Below is a copy of the discharge summary and he has actually had 3 admissions now since July of this year Post Hosp visit   3 admits since 07/2021 OV Discharge date: 12/08/21 Discharge Physician: Rickey Barbara   PCP: [name], MD    Recommendations at discharge:    1.  Follow up with PCP in 1-2 weeks 2. Follow up with Cardiology as scheduled   Discharge Diagnoses: Principal Problem:   Respiratory failure (HCC) Active Problems:   COPD exacerbation (HCC)   Malnutrition of moderate degree   Non-STEMI (non-ST elevated myocardial infarction) (HCC)   Resolved Problems:   * No resolved hospital problems. *   Hospital Course: 62 year old gentleman from home, has history of COPD and chronic hypoxemic respiratory failure on 2 L oxygen at home.  He does have a history of type 2 diabetes, hypertension.  He was found with  severe COPD exacerbation secondary to rhinovirus infection, intubated and admitted to ICU.  Also found to have SVT , not Afib  1/25 Admit, intubated, started ABx, start cardizem gtt-->got hypotensive. Converted to SR but started on Neo.   11/26 Overnight transferred from [facility] ED to [facility] ICU  11/27 Remains intubated on Prop/Versed for sedation; increased secretions with high peak pressures on vent, Resp Cx sent.  11/28 Resp Cx with few gram + cocci in pairs. FiO2 needs down to 50%. Secretions improved, but ongoing breath stacking. Sedation to Precedex. Tolerating PSV. Extubated. Hypertensive, started on cleviprex + precedex  Cardiology following and diastolic CHF.  Ischemic evaluation with left heart cath planned for 12/4.   Assessment and Plan: Acute on chronic hypoxemic and hypercapnic respiratory failure: Acute COPD exacerbation due to rhinovirus infection as well as superimposed bacterial pneumonia: On baseline 2 L oxygen at home.  Extubated to 2 L of oxygen. Remained stable on transfer to floor Continued Brovana, Pulmicort and Yupelri.  On as needed bronchodilator therapy. Solu-Medrol, tapered off to prednisone Completed broad spec abx   Essential hypertension with hypertensive emergency: Initially hypotensive after intubation on vasopressors.  Then hypertensive.  Patient was on Cleviprex infusion. Currently blood pressure stabilized on blood pressure and Cozaar.  Given lasix   Recurrent SVT:  Echo with ejection fraction 50 to 55%.  Cardiology following.  Currently in sinus rhythm.  On metoprolol, aspirin.  Improved.   Type 2 diabetes  poor control, steroid-induced hyperglycemia: Fairly stable.  Not on treatment at home.   Elevated troponins with T wave depression: Respiratory status improving.  Cardiology had been following. Underwent clear heart cath 12/4. Cardiology recs for toprol xl 50mg . No need for ASA. Cardiology to f/u in 4 weeks   Clinically improving.   -Continue to work  with PT OT.   -PT recommended SNF but patient reportedly felt like he can go home.   -Eager to go home   Rash -pruritic rash in gluteal region, groin, and also over L forarm (areas pt is scratching) -Rash appears fungal -continue topical antifungal   Patient still has rash in the groin area and on buttocks.  He was given topical antifungal only.  He still coughing up yellow mucus still doing some wheezing.  He needs a follow-up chest x-ray.  He has completed his course of antibiotics is now down to the 10 mg daily prednisone.  Blood pressure is elevated on arrival 157/96.  He is now on metoprolol and low-dose losartan for blood pressure.  Still maintains the oral glipizide for diabetes.  He no longer has any home care.  He is back in the original housing has had before.  He does not drink alcohol is not smoking cigarettes.  He is chewing tobacco.  He is having trouble with swallowing and choking with this.  He needs a follow-up eye exam and needs his foot examined today.  Patient also notes vision changes left eye  03/02/22 Patient seen in return follow-up has not been back to the hospital except for a brief emergency room visit end of December.  Patient COPD is at baseline.  He just saw pulmonary medicine has an FEV1 of 40% predicted and a very low diffusion capacity due to centrilobular emphysema.  He is compliant with the Rosalie he.  He is using oxygen at bedtime as needed during the day.  On arrival saturation 94% room air.  Patient weight is up and is a BMI 31.6.  Blood sugar on arrival 164.  Patient takes the glipizide daily.  He is not on lipids therapy needs to be started.  Blood pressure on arrival is good 128/60.  Patient is not smoking or drinking alcohol.  He needs an A1c and other labs rechecked at this visit.  There are no other complaints.  06/08/22 Patient seen and return follow-up had previously been seen in early May for COPD exacerbation however he is now improved and  stable. Patient needs refills on multiple medication oxygen is improved.  06/29/22  TOC\ Patient is seen back due to short-term follow-up related to recurrent admission for COPD exacerbation.  Below is the documentation of the discharge summary.  The patient is seen today in return follow-up post hospital.  Patient needs several medications refilled.  He still having edema in the lower extremity.  He arrives with his portable cylinder oxygen extinguished.  Patient still chewing tobacco products.  He apparently went out in the heat to walk around and this cause exacerbations. Physician Discharge Summary BABU GOHN NUU:[id number] DOB: 01/23/1960 DOA: 06/14/2022   PCP: [name], MD   Admit date: 06/14/2022 Discharge date: 06/21/2022 Recommendations for Outpatient Follow-up:  1. Follow up with PCP in 1-2 weeks 2. Please obtain BMP/CBC in one week 3. Please follow up with your PCP on the following pending results: Home Health: None Equipment/Devices: None   Discharge Condition: Stable  CODE STATUS: Full code Diet recommendation: Cardiac   Subjective: Seen and  Post Hospital transition of care visit patient Office Visit  Subjective   Patient ID: James [name], male    DOB: Mar 13, 1960  Age: 62 y.o. MRN: [id number]  Chief Complaint  Patient presents with   Medical Management of Chronic Issues    06/29/21 Since the last visit patient's been doing well however he ran out of his losartan for 1 week.  He does complain of an itching rash on both arms.  He is painting the inside of his house he tries to stay from the out-of-doors and where the heat is.  CT of the chest was done and showed pulmonary nodules will need to be repeated in 6 months.  Pneumonia has resolved.  He has been compliant with his inhaled medications.  Breathing appears to be at baseline.  7/18 Yet another readmission since last OV: Patient had developed pneumonia increased cough shortness of breath here today for follow-up  Patient still coughing mucus white in nature some wheezing still.  Below is copy discharge summary  12/26 Patient seen as a posthospital visit and had yet another episode of pneumonia right lower lobe and required hospitalization. Below is a copy of the discharge summary and he has actually had 3 admissions now since July of this year Post Hosp visit   3 admits since 07/2021 OV Discharge date: 12/08/21 Discharge Physician: Rickey Barbara   PCP: [name], MD    Recommendations at discharge:    1.  Follow up with PCP in 1-2 weeks 2. Follow up with Cardiology as scheduled   Discharge Diagnoses: Principal Problem:   Respiratory failure (HCC) Active Problems:   COPD exacerbation (HCC)   Malnutrition of moderate degree   Non-STEMI (non-ST elevated myocardial infarction) (HCC)   Resolved Problems:   * No resolved hospital problems. *   Hospital Course: 62 year old gentleman from home, has history of COPD and chronic hypoxemic respiratory failure on 2 L oxygen at home.  He does have a history of type 2 diabetes, hypertension.  He was found with  severe COPD exacerbation secondary to rhinovirus infection, intubated and admitted to ICU.  Also found to have SVT , not Afib  1/25 Admit, intubated, started ABx, start cardizem gtt-->got hypotensive. Converted to SR but started on Neo.   11/26 Overnight transferred from [facility] ED to [facility] ICU  11/27 Remains intubated on Prop/Versed for sedation; increased secretions with high peak pressures on vent, Resp Cx sent.  11/28 Resp Cx with few gram + cocci in pairs. FiO2 needs down to 50%. Secretions improved, but ongoing breath stacking. Sedation to Precedex. Tolerating PSV. Extubated. Hypertensive, started on cleviprex + precedex  Cardiology following and diastolic CHF.  Ischemic evaluation with left heart cath planned for 12/4.   Assessment and Plan: Acute on chronic hypoxemic and hypercapnic respiratory failure: Acute COPD exacerbation due to rhinovirus infection as well as superimposed bacterial pneumonia: On baseline 2 L oxygen at home.  Extubated to 2 L of oxygen. Remained stable on transfer to floor Continued Brovana, Pulmicort and Yupelri.  On as needed bronchodilator therapy. Solu-Medrol, tapered off to prednisone Completed broad spec abx   Essential hypertension with hypertensive emergency: Initially hypotensive after intubation on vasopressors.  Then hypertensive.  Patient was on Cleviprex infusion. Currently blood pressure stabilized on blood pressure and Cozaar.  Given lasix   Recurrent SVT:  Echo with ejection fraction 50 to 55%.  Cardiology following.  Currently in sinus rhythm.  On metoprolol, aspirin.  Improved.   Type 2 diabetes  Post Hospital transition of care visit patient Office Visit  Subjective   Patient ID: James [name], male    DOB: Mar 13, 1960  Age: 62 y.o. MRN: [id number]  Chief Complaint  Patient presents with   Medical Management of Chronic Issues    06/29/21 Since the last visit patient's been doing well however he ran out of his losartan for 1 week.  He does complain of an itching rash on both arms.  He is painting the inside of his house he tries to stay from the out-of-doors and where the heat is.  CT of the chest was done and showed pulmonary nodules will need to be repeated in 6 months.  Pneumonia has resolved.  He has been compliant with his inhaled medications.  Breathing appears to be at baseline.  7/18 Yet another readmission since last OV: Patient had developed pneumonia increased cough shortness of breath here today for follow-up  Patient still coughing mucus white in nature some wheezing still.  Below is copy discharge summary  12/26 Patient seen as a posthospital visit and had yet another episode of pneumonia right lower lobe and required hospitalization. Below is a copy of the discharge summary and he has actually had 3 admissions now since July of this year Post Hosp visit   3 admits since 07/2021 OV Discharge date: 12/08/21 Discharge Physician: Rickey Barbara   PCP: [name], MD    Recommendations at discharge:    1.  Follow up with PCP in 1-2 weeks 2. Follow up with Cardiology as scheduled   Discharge Diagnoses: Principal Problem:   Respiratory failure (HCC) Active Problems:   COPD exacerbation (HCC)   Malnutrition of moderate degree   Non-STEMI (non-ST elevated myocardial infarction) (HCC)   Resolved Problems:   * No resolved hospital problems. *   Hospital Course: 62 year old gentleman from home, has history of COPD and chronic hypoxemic respiratory failure on 2 L oxygen at home.  He does have a history of type 2 diabetes, hypertension.  He was found with  severe COPD exacerbation secondary to rhinovirus infection, intubated and admitted to ICU.  Also found to have SVT , not Afib  1/25 Admit, intubated, started ABx, start cardizem gtt-->got hypotensive. Converted to SR but started on Neo.   11/26 Overnight transferred from [facility] ED to [facility] ICU  11/27 Remains intubated on Prop/Versed for sedation; increased secretions with high peak pressures on vent, Resp Cx sent.  11/28 Resp Cx with few gram + cocci in pairs. FiO2 needs down to 50%. Secretions improved, but ongoing breath stacking. Sedation to Precedex. Tolerating PSV. Extubated. Hypertensive, started on cleviprex + precedex  Cardiology following and diastolic CHF.  Ischemic evaluation with left heart cath planned for 12/4.   Assessment and Plan: Acute on chronic hypoxemic and hypercapnic respiratory failure: Acute COPD exacerbation due to rhinovirus infection as well as superimposed bacterial pneumonia: On baseline 2 L oxygen at home.  Extubated to 2 L of oxygen. Remained stable on transfer to floor Continued Brovana, Pulmicort and Yupelri.  On as needed bronchodilator therapy. Solu-Medrol, tapered off to prednisone Completed broad spec abx   Essential hypertension with hypertensive emergency: Initially hypotensive after intubation on vasopressors.  Then hypertensive.  Patient was on Cleviprex infusion. Currently blood pressure stabilized on blood pressure and Cozaar.  Given lasix   Recurrent SVT:  Echo with ejection fraction 50 to 55%.  Cardiology following.  Currently in sinus rhythm.  On metoprolol, aspirin.  Improved.   Type 2 diabetes  poor control, steroid-induced hyperglycemia: Fairly stable.  Not on treatment at home.   Elevated troponins with T wave depression: Respiratory status improving.  Cardiology had been following. Underwent clear heart cath 12/4. Cardiology recs for toprol xl 50mg . No need for ASA. Cardiology to f/u in 4 weeks   Clinically improving.   -Continue to work  with PT OT.   -PT recommended SNF but patient reportedly felt like he can go home.   -Eager to go home   Rash -pruritic rash in gluteal region, groin, and also over L forarm (areas pt is scratching) -Rash appears fungal -continue topical antifungal   Patient still has rash in the groin area and on buttocks.  He was given topical antifungal only.  He still coughing up yellow mucus still doing some wheezing.  He needs a follow-up chest x-ray.  He has completed his course of antibiotics is now down to the 10 mg daily prednisone.  Blood pressure is elevated on arrival 157/96.  He is now on metoprolol and low-dose losartan for blood pressure.  Still maintains the oral glipizide for diabetes.  He no longer has any home care.  He is back in the original housing has had before.  He does not drink alcohol is not smoking cigarettes.  He is chewing tobacco.  He is having trouble with swallowing and choking with this.  He needs a follow-up eye exam and needs his foot examined today.  Patient also notes vision changes left eye  03/02/22 Patient seen in return follow-up has not been back to the hospital except for a brief emergency room visit end of December.  Patient COPD is at baseline.  He just saw pulmonary medicine has an FEV1 of 40% predicted and a very low diffusion capacity due to centrilobular emphysema.  He is compliant with the Rosalie he.  He is using oxygen at bedtime as needed during the day.  On arrival saturation 94% room air.  Patient weight is up and is a BMI 31.6.  Blood sugar on arrival 164.  Patient takes the glipizide daily.  He is not on lipids therapy needs to be started.  Blood pressure on arrival is good 128/60.  Patient is not smoking or drinking alcohol.  He needs an A1c and other labs rechecked at this visit.  There are no other complaints.  06/08/22 Patient seen and return follow-up had previously been seen in early May for COPD exacerbation however he is now improved and  stable. Patient needs refills on multiple medication oxygen is improved.  06/29/22  TOC\ Patient is seen back due to short-term follow-up related to recurrent admission for COPD exacerbation.  Below is the documentation of the discharge summary.  The patient is seen today in return follow-up post hospital.  Patient needs several medications refilled.  He still having edema in the lower extremity.  He arrives with his portable cylinder oxygen extinguished.  Patient still chewing tobacco products.  He apparently went out in the heat to walk around and this cause exacerbations. Physician Discharge Summary BABU GOHN NUU:[id number] DOB: 01/23/1960 DOA: 06/14/2022   PCP: [name], MD   Admit date: 06/14/2022 Discharge date: 06/21/2022 Recommendations for Outpatient Follow-up:  1. Follow up with PCP in 1-2 weeks 2. Please obtain BMP/CBC in one week 3. Please follow up with your PCP on the following pending results: Home Health: None Equipment/Devices: None   Discharge Condition: Stable  CODE STATUS: Full code Diet recommendation: Cardiac   Subjective: Seen and

## 2022-09-28 ENCOUNTER — Other Ambulatory Visit: Payer: Self-pay

## 2022-09-28 ENCOUNTER — Encounter: Payer: Self-pay | Admitting: Critical Care Medicine

## 2022-09-28 ENCOUNTER — Ambulatory Visit: Payer: Medicaid Other | Attending: Critical Care Medicine | Admitting: Critical Care Medicine

## 2022-09-28 VITALS — BP 120/68 | HR 84 | Wt 203.8 lb

## 2022-09-28 DIAGNOSIS — J4489 Other specified chronic obstructive pulmonary disease: Secondary | ICD-10-CM

## 2022-09-28 DIAGNOSIS — E119 Type 2 diabetes mellitus without complications: Secondary | ICD-10-CM

## 2022-09-28 DIAGNOSIS — J9611 Chronic respiratory failure with hypoxia: Secondary | ICD-10-CM | POA: Diagnosis not present

## 2022-09-28 DIAGNOSIS — I10 Essential (primary) hypertension: Secondary | ICD-10-CM | POA: Diagnosis not present

## 2022-09-28 DIAGNOSIS — B009 Herpesviral infection, unspecified: Secondary | ICD-10-CM

## 2022-09-28 DIAGNOSIS — Z7984 Long term (current) use of oral hypoglycemic drugs: Secondary | ICD-10-CM

## 2022-09-28 DIAGNOSIS — R918 Other nonspecific abnormal finding of lung field: Secondary | ICD-10-CM | POA: Diagnosis not present

## 2022-09-28 DIAGNOSIS — E1141 Type 2 diabetes mellitus with diabetic mononeuropathy: Secondary | ICD-10-CM | POA: Diagnosis not present

## 2022-09-28 DIAGNOSIS — J479 Bronchiectasis, uncomplicated: Secondary | ICD-10-CM | POA: Diagnosis not present

## 2022-09-28 MED ORDER — ALBUTEROL SULFATE HFA 108 (90 BASE) MCG/ACT IN AERS
2.0000 | INHALATION_SPRAY | Freq: Four times a day (QID) | RESPIRATORY_TRACT | 1 refills | Status: DC | PRN
  Filled 2022-09-28 – 2022-10-18 (×3): qty 54, 75d supply, fill #0

## 2022-09-28 MED ORDER — ALBUTEROL SULFATE (2.5 MG/3ML) 0.083% IN NEBU
2.5000 mg | INHALATION_SOLUTION | RESPIRATORY_TRACT | 1 refills | Status: DC | PRN
  Filled 2022-09-28: qty 270, 15d supply, fill #0
  Filled 2022-10-25: qty 225, 13d supply, fill #0
  Filled 2022-10-25: qty 270, 15d supply, fill #0
  Filled 2023-01-28: qty 225, 13d supply, fill #1

## 2022-09-28 MED ORDER — FOLIC ACID 1 MG PO TABS
1.0000 mg | ORAL_TABLET | Freq: Every day | ORAL | 1 refills | Status: DC
  Filled 2022-09-28 – 2022-12-01 (×3): qty 90, 90d supply, fill #0
  Filled 2023-03-15: qty 90, 90d supply, fill #1

## 2022-09-28 MED ORDER — PREGABALIN 100 MG PO CAPS
100.0000 mg | ORAL_CAPSULE | Freq: Two times a day (BID) | ORAL | 2 refills | Status: DC
  Filled 2022-09-28: qty 60, 30d supply, fill #0

## 2022-09-28 MED ORDER — GLIPIZIDE ER 5 MG PO TB24
5.0000 mg | ORAL_TABLET | Freq: Every day | ORAL | 1 refills | Status: DC
  Filled 2022-09-28 – 2022-12-01 (×3): qty 90, 90d supply, fill #0
  Filled 2023-03-15: qty 90, 90d supply, fill #1

## 2022-09-28 MED ORDER — ATORVASTATIN CALCIUM 10 MG PO TABS
10.0000 mg | ORAL_TABLET | Freq: Every day | ORAL | 3 refills | Status: DC
  Filled 2022-09-28 – 2022-10-08 (×2): qty 90, 90d supply, fill #0
  Filled 2023-01-10: qty 90, 90d supply, fill #1
  Filled 2023-01-10: qty 90, 90d supply, fill #0
  Filled 2023-04-20 (×2): qty 90, 90d supply, fill #1

## 2022-09-28 MED ORDER — BREZTRI AEROSPHERE 160-9-4.8 MCG/ACT IN AERO
2.0000 | INHALATION_SPRAY | Freq: Two times a day (BID) | RESPIRATORY_TRACT | 11 refills | Status: DC
  Filled 2022-09-28 – 2022-10-18 (×3): qty 10.7, 30d supply, fill #0
  Filled 2022-12-01 (×2): qty 10.7, 30d supply, fill #1
  Filled 2023-01-10 (×2): qty 10.7, 30d supply, fill #2
  Filled 2023-03-15: qty 10.7, 30d supply, fill #3
  Filled 2023-04-20 (×2): qty 10.7, 30d supply, fill #4
  Filled 2023-06-21: qty 10.7, 30d supply, fill #5
  Filled 2023-07-19: qty 10.7, 30d supply, fill #6
  Filled 2023-09-27: qty 10.7, 30d supply, fill #7

## 2022-09-28 MED ORDER — ALBUTEROL SULFATE (2.5 MG/3ML) 0.083% IN NEBU
2.5000 mg | INHALATION_SOLUTION | RESPIRATORY_TRACT | 1 refills | Status: DC | PRN
  Filled 2022-09-28: qty 270, 15d supply, fill #0

## 2022-09-28 MED ORDER — FUROSEMIDE 40 MG PO TABS
40.0000 mg | ORAL_TABLET | Freq: Every day | ORAL | 1 refills | Status: DC
  Filled 2022-09-28: qty 90, fill #0
  Filled 2022-10-08 – 2023-01-10 (×2): qty 90, 90d supply, fill #0
  Filled 2023-01-10: qty 90, 90d supply, fill #1

## 2022-09-28 NOTE — Patient Instructions (Addendum)
No change in medications No labs needed Return 3 months

## 2022-09-28 NOTE — Progress Notes (Unsigned)
Bs 215

## 2022-09-29 NOTE — Assessment & Plan Note (Addendum)
Non-crusting lesions to bilateral corners of mouth. No blistering noted. Will watch. No medications prescribed

## 2022-09-29 NOTE — Assessment & Plan Note (Signed)
Diabetes controlled. Last A1C 6.2 on 06/14/22. CBG 215 expected patient on chronic prednisone.  Continue current plan

## 2022-09-29 NOTE — Assessment & Plan Note (Addendum)
Hypertension well controlled. BP 120/68.Creatinine normal on 07/16/22. Plan to continue Metoprolol XR 50 mg PO daily. Furosemide increased to 40 mg PO daily.

## 2022-09-30 NOTE — Assessment & Plan Note (Signed)
Continue nightly oxygen 2L

## 2022-09-30 NOTE — Assessment & Plan Note (Signed)
Monitor

## 2022-09-30 NOTE — Assessment & Plan Note (Signed)
Airways stable  no change in medications

## 2022-10-01 DIAGNOSIS — R1310 Dysphagia, unspecified: Secondary | ICD-10-CM

## 2022-10-05 ENCOUNTER — Other Ambulatory Visit: Payer: Self-pay

## 2022-10-05 ENCOUNTER — Telehealth: Payer: Self-pay | Admitting: Pharmacist

## 2022-10-05 ENCOUNTER — Other Ambulatory Visit (HOSPITAL_COMMUNITY): Payer: Self-pay

## 2022-10-05 DIAGNOSIS — J9601 Acute respiratory failure with hypoxia: Secondary | ICD-10-CM | POA: Diagnosis not present

## 2022-10-05 DIAGNOSIS — J9621 Acute and chronic respiratory failure with hypoxia: Secondary | ICD-10-CM | POA: Diagnosis not present

## 2022-10-05 DIAGNOSIS — F79 Unspecified intellectual disabilities: Secondary | ICD-10-CM | POA: Diagnosis not present

## 2022-10-05 MED ORDER — PREGABALIN 100 MG PO CAPS
100.0000 mg | ORAL_CAPSULE | Freq: Two times a day (BID) | ORAL | 1 refills | Status: DC
  Filled 2022-10-05: qty 60, 30d supply, fill #0
  Filled 2022-12-01 (×2): qty 60, 30d supply, fill #1

## 2022-10-05 NOTE — Addendum Note (Signed)
Addended by: Hoy Register on: 10/05/2022 12:30 PM   Modules accepted: Orders

## 2022-10-05 NOTE — Telephone Encounter (Signed)
Hey Dr. Alvis Lemmings,   This is a pt of Dr. Lynelle Doctor. With him being retired, I was wondering if you could assist me. Pt is on Lyrica and our pharmacy has it at their site. Pt is wanting the medication filled at [facility] for mail order. It is controlled and the current rxn we have on file has not been filled. Therefore, we cannot transfer to [facility] and I cannot send as it's controlled. Would you be willing to send rxn to [facility]?

## 2022-10-05 NOTE — Telephone Encounter (Signed)
Done

## 2022-10-07 DIAGNOSIS — J9621 Acute and chronic respiratory failure with hypoxia: Secondary | ICD-10-CM | POA: Diagnosis not present

## 2022-10-07 DIAGNOSIS — F79 Unspecified intellectual disabilities: Secondary | ICD-10-CM | POA: Diagnosis not present

## 2022-10-07 DIAGNOSIS — J9601 Acute respiratory failure with hypoxia: Secondary | ICD-10-CM | POA: Diagnosis not present

## 2022-10-08 ENCOUNTER — Other Ambulatory Visit (HOSPITAL_COMMUNITY): Payer: Self-pay

## 2022-10-13 ENCOUNTER — Other Ambulatory Visit: Payer: Medicaid Other | Admitting: *Deleted

## 2022-10-13 NOTE — Patient Instructions (Signed)
Visit Information  James [name] was given information about Medicaid Managed Care team care coordination services as a part of their Healthy [facility] Medicaid benefit. [name] verbally consented to engagement with the [facility] Managed Care team.   If you are experiencing a medical emergency, please call 911 or report to your local emergency department or urgent care.   If you have a non-emergency medical problem during routine business hours, please contact your provider's office and ask to speak with a nurse.   For questions related to your Healthy [facility] health plan, please call: [phone] or visit the homepage here: [email or web address]  If you would like to schedule transportation through your Healthy [facility] plan, please call the following number at least 2 days in advance of your appointment: [phone]  For information about your ride after you set it up, call Ride Assist at [phone]. Use this number to activate a Will Call pickup, or if your transportation is late for a scheduled pickup. Use this number, too, if you need to make a change or cancel a previously scheduled reservation.  If you need transportation services right away, call [phone]. The after-hours call center is staffed 24 hours to handle ride assistance and urgent reservation requests (including discharges) 365 days a year. Urgent trips include sick visits, hospital discharge requests and life-sustaining treatment.  Call the [facility] Line at [phone], at any time, 24 hours a day, 7 days a week. If you are in danger or need immediate medical attention call 911.  If you would like help to quit smoking, call 1-800-QUIT-NOW (239-720-6364) OR Espaol: 1-855-Djelo-Ya (7-564-332-9518) o para ms informacin haga clic aqu or Text READY to 841-660 to register via text  Mr. Ditter,   Please see education materials related to infection prevention  provided as print materials.   The patient verbalized understanding of instructions, educational materials, and care plan provided today and agreed to receive a mailed copy of patient instructions, educational materials, and care plan.   Telephone follow up appointment with Managed Medicaid care management team member scheduled for:11/02/22 at 10:30am  [name] RN, BSN [place]  Value-Based Care Institute [facility] RN Care Coordinator [phone]   Following is a copy of your plan of care:  Care Plan : RN Care Manager Plan of Care  Updates made by [name], RN since 10/13/2022 12:00 AM     Problem: Chronic Disease Management needs in patient with COPD   Priority: High     Long-Range Goal: Plan of care established for for Chronic Disease management for patient with COPD   Start Date: 11/13/2020  Expected End Date: 12/03/2022  Priority: High  Note:   Current Barriers:  Care Coordination needs related to Limited access to food  Chronic Disease Management support and education needs related to COPD    RNCM Clinical Goal(s):  Patient will verbalize understanding of plan for management of COPD as evidenced by taking all medications as prescribed, attending all scheduled appointments and contacting provider with questions and concerns take all medications exactly as prescribed and will call provider for medication related questions as evidenced by refilling medications and taking as prescribed    attend all scheduled medical appointments: 09/09/22 with PCP as evidenced by provider notes in EMR        demonstrate improved adherence to prescribed treatment plan for COPD as evidenced by taking all medications as prescribed, attending all scheduled appointments and contacting provider with questions and concerns continue to work  with RN Care Manager and/or Social Worker to address care management and care coordination needs related to COPD as evidenced by adherence to  CM Team Scheduled appointments     through collaboration with RN Care manager, provider, and care team.   Interventions: Inter-disciplinary care team collaboration (see longitudinal plan of care) Evaluation of current treatment plan related to  self management and patient's adherence to plan as established by provider Discussed PCP retiring, patient expressed    COPD: (Status: Goal on Track (progressing): YES.) Long Term Goal  Reviewed medications with patient, assisted with requesting needed refills Advised patient to self assesses COPD action plan zone and make appointment with provider if in the yellow zone for 48 hours without improvement Provided education about and advised patient to utilize infection prevention strategies to reduce risk of respiratory infection Discussed the importance of adequate rest and management of fatigue with COPD Provided encouragement to continue to avoid drinking alcohol and dipping Discussed triggers and avoiding being out in the heat during the hottest part of the day Discussed the importance of handwashing and wearing a mask in public, explained the importance of infection prevention Advised patient to request Breztri refill today Advised patient to schedule follow up with Pulmonology/Dr. Tonia Brooms [phone]  Patient Goals/Self-Care Activities: Take medications as prescribed   Attend all scheduled provider appointments Call pharmacy for medication refills 3-7 days in advance of running out of medications Perform all self care activities independently  Call provider office for new concerns or questions  - avoid second hand smoke - limit outdoor activity during cold weather - keep follow-up appointments: 05/13/22 with Cardiology - eat healthy/prescribed diet: DASH - get at least 7 to 8 hours of sleep at night

## 2022-10-13 NOTE — Patient Outreach (Signed)
Medicaid Managed Care   Nurse Care Manager Note  10/13/2022 Name:  James [name] MRN:  [id number] DOB:  1960-06-29  James [name] is an 62 y.o. year old male who is [name] primary patient of James Field Charlcie Cradle, MD.  The [facility] Managed Care Coordination team was consulted for assistance with:    COPD  [name] was given information about Medicaid Managed Care Coordination team services today. James [name] Patient agreed to services and verbal consent obtained.  Engaged with patient by telephone for follow up visit in response to provider referral for case management and/or care coordination services.   Assessments/Interventions:  Review of past medical history, allergies, medications, health status, including review of consultants reports, laboratory and other test data, was performed as part of comprehensive evaluation and provision of chronic care management services.  SDOH (Social Determinants of Health) assessments and interventions performed: SDOH Interventions    Flowsheet Row Patient Outreach Telephone from 10/13/2022 in [place] POPULATION HEALTH DEPARTMENT Telephone from 07/16/2022 in [place] POPULATION HEALTH DEPARTMENT Patient Outreach Telephone from 06/30/2022 in [place] POPULATION HEALTH DEPARTMENT Patient Outreach Telephone from 05/25/2022 in [place] POPULATION HEALTH DEPARTMENT Patient Outreach Telephone from 04/19/2022 in [place] POPULATION HEALTH DEPARTMENT Patient Outreach Telephone from 04/14/2022 in [place] POPULATION HEALTH DEPARTMENT  SDOH Interventions        Food Insecurity Interventions -- -- -- -- Intervention Not Indicated --  Housing Interventions -- -- -- Intervention Not Indicated -- --  Transportation Interventions Payor Benefit Payor Benefit Payor Benefit  [assisted with arranging transportation] Payor Benefit  [Assisted with arranging transportation to upcoming appointments] -- Payor Benefit  James [name] assisted with arranging transportation with  James [name]]  Utilities Interventions -- -- -- -- Intervention Not Indicated --       Care Plan  No Known Allergies  Medications Reviewed Today     Reviewed by James Dach, RN (Registered Nurse) on 10/13/22 at 0912  Med List Status: <None>   Medication Order Taking? Sig Documenting Provider Last Dose Status Informant  albuterol (PROVENTIL) (2.5 MG/3ML) 0.083% nebulizer solution [id number] Yes Take 3 mLs (2.5 mg total) by nebulization every 4 (four) hours as needed for wheezing or shortness of breath. James Frisk, MD Taking Active   albuterol (VENTOLIN HFA) 108 [phone] Base) MCG/ACT inhaler [id number] Yes Inhale 2 puffs into the lungs every 6 (six) hours as needed for wheezing or shortness of breath. James Frisk, MD Taking Active   atorvastatin (LIPITOR) 10 MG tablet [id number] Yes Take 1 tablet (10 mg total) by mouth daily. James Frisk, MD Taking Active   Budeson-Glycopyrrol-Formoterol (BREZTRI AEROSPHERE) 160-9-4.8 MCG/ACT [name] [id number] No Inhale 2 puffs into the lungs in the morning and at bedtime.  Patient not taking: Reported on 10/13/2022   James Frisk, MD Not Taking Active            Med Note James [name], James [name]   Wed Oct 13, 2022  9:09 AM) Needs refill  folic acid (FOLVITE) 1 MG tablet [id number] Yes Take 1 tablet (1 mg total) by mouth daily. James Frisk, MD Taking Active   furosemide (LASIX) 40 MG tablet [id number] Yes Take 1 tablet (40 mg total) by mouth daily. James Frisk, MD Taking Active   glipiZIDE (GLUCOTROL XL) 5 MG 24 hr tablet [id number] Yes Take 1 tablet (5 mg total) by mouth daily with breakfast. James Frisk, MD Taking Active   losartan (COZAAR) 100 MG tablet [id number] Yes Take  1 tablet (100 mg total) by mouth daily. James Frisk, MD Taking Active Self, Pharmacy Records  metoprolol succinate (TOPROL-XL) 50 MG 24 hr tablet [id number] Yes Take 1 tablet (50 mg total) by mouth daily. Take with or immediately following [name] meal. James Frisk, MD  Taking Active Self, Pharmacy Records  nystatin cream (MYCOSTATIN) [id number] Yes Apply to affected areas 3 (three) times daily. James Frisk, MD Taking Active   pantoprazole (PROTONIX) 40 MG tablet [id number] Yes Take 1 tablet (40 mg total) by mouth daily. James Frisk, MD Taking Active   predniSONE (DELTASONE) 10 MG tablet [id number] Yes Take 1 tablet (10 mg total) by mouth daily with breakfast. James Frisk, MD Taking Active   pregabalin (LYRICA) 100 MG capsule [id number] Yes Take 1 capsule (100 mg total) by mouth 2 (two) times daily. James Register, MD Taking Active             Patient Active Problem List   Diagnosis Date Noted   Dysphagia 10/01/2022   Alcohol dependence in remission (HCC) 03/02/2022   Aortic root dilation (HCC) 03/02/2022   Vision changes 12/29/2021   Oropharyngeal dysphagia 12/29/2021   Onychomycosis 12/29/2021   Malnutrition of moderate degree 12/05/2021   Tinea cruris 07/21/2021   Multiple lung nodules on CT 06/11/2021   COPD with chronic bronchitis (HCC) 02/01/2021   GERD (gastroesophageal reflux disease) 02/01/2021   Neuropathy 09/29/2020   Bronchiectasis (HCC) 09/24/2019   Type 2 diabetes mellitus (HCC) 07/24/2019   Other atopic dermatitis 05/01/2019   Chronic respiratory failure with hypoxia (HCC) 04/23/2019   Intellectual disability 04/23/2019   HTN (hypertension) 05/17/2016   Former tobacco use 04/06/2016   COPD mixed type (HCC) 03/26/2016    Conditions to be addressed/monitored per PCP order:  COPD  Care Plan : RN Care Manager Plan of Care  Updates made by James Dach, RN since 10/13/2022 12:00 AM     Problem: Chronic Disease Management needs in patient with COPD   Priority: High     Long-Range Goal: Plan of care established for for Chronic Disease management for patient with COPD   Start Date: 11/13/2020  Expected End Date: 12/03/2022  Priority: High  Note:   Current Barriers:  Care Coordination needs related to  Limited access to food  Chronic Disease Management support and education needs related to COPD    RNCM Clinical Goal(s):  Patient will verbalize understanding of plan for management of COPD as evidenced by taking all medications as prescribed, attending all scheduled appointments and contacting provider with questions and concerns take all medications exactly as prescribed and will call provider for medication related questions as evidenced by refilling medications and taking as prescribed    attend all scheduled medical appointments: 09/09/22 with PCP as evidenced by provider notes in EMR        demonstrate improved adherence to prescribed treatment plan for COPD as evidenced by taking all medications as prescribed, attending all scheduled appointments and contacting provider with questions and concerns continue to work with RN Care Manager and/or Social Worker to address care management and care coordination needs related to COPD as evidenced by adherence to CM Team Scheduled appointments     through collaboration with RN Care manager, provider, and care team.   Interventions: Inter-disciplinary care team collaboration (see longitudinal plan of care) Evaluation of current treatment plan related to  self management and patient's adherence to plan as established by provider Discussed PCP retiring, patient expressed  COPD: (Status: Goal on Track (progressing): YES.) Long Term Goal  Reviewed medications with patient, assisted with requesting needed refills Advised patient to self assesses COPD action plan zone and make appointment with provider if in the yellow zone for 48 hours without improvement Provided education about and advised patient to utilize infection prevention strategies to reduce risk of respiratory infection Discussed the importance of adequate rest and management of fatigue with COPD Provided encouragement to continue to avoid drinking alcohol and dipping Discussed triggers and  avoiding being out in the heat during the hottest part of the day Discussed the importance of handwashing and wearing [name] mask in public, explained the importance of infection prevention Advised patient to request Breztri refill today Advised patient to schedule follow up with Pulmonology/Dr. Tonia Brooms [phone]  Patient Goals/Self-Care Activities: Take medications as prescribed   Attend all scheduled provider appointments Call pharmacy for medication refills 3-7 days in advance of running out of medications Perform all self care activities independently  Call provider office for new concerns or questions  - avoid second hand smoke - limit outdoor activity during cold weather - keep follow-up appointments: 05/13/22 with Cardiology - eat healthy/prescribed diet: DASH - get at least 7 to 8 hours of sleep at night       Follow Up:  Patient agrees to Care Plan and Follow-up.  Plan: The Managed Medicaid care management team will reach out to the patient again over the next 30 days.  Date/time of next scheduled RN care management/care coordination outreach:  11/02/22 at 10:30am  [name] RN, BSN [place]  Value-Based Care Institute [facility] RN Care Coordinator [phone]

## 2022-10-18 ENCOUNTER — Other Ambulatory Visit (HOSPITAL_COMMUNITY): Payer: Self-pay

## 2022-10-18 ENCOUNTER — Other Ambulatory Visit: Payer: Self-pay

## 2022-10-19 ENCOUNTER — Other Ambulatory Visit: Payer: Self-pay

## 2022-10-20 ENCOUNTER — Other Ambulatory Visit: Payer: Self-pay

## 2022-10-25 ENCOUNTER — Other Ambulatory Visit (HOSPITAL_COMMUNITY): Payer: Self-pay

## 2022-10-25 ENCOUNTER — Other Ambulatory Visit: Payer: Self-pay

## 2022-10-26 ENCOUNTER — Other Ambulatory Visit: Payer: Self-pay

## 2022-11-02 ENCOUNTER — Other Ambulatory Visit: Payer: Self-pay | Admitting: *Deleted

## 2022-11-02 NOTE — Patient Outreach (Signed)
Medicaid Managed Care   Nurse Care Manager Note  11/02/2022 Name:  James [name] MRN:  [id number] DOB:  [birth year]  James [name] is an 62 y.o. year old male who is [name] primary patient of James Field Charlcie Cradle, MD.  The [facility] Managed Care Coordination team was consulted for assistance with:    COPD  James [name] was given information about Medicaid Managed Care Coordination team services today. James [name] Patient agreed to services and verbal consent obtained.  Engaged with patient by telephone for follow up visit in response to provider referral for case management and/or care coordination services.   Assessments/Interventions:  Review of past medical history, allergies, medications, health status, including review of consultants reports, laboratory and other test data, was performed as part of comprehensive evaluation and provision of chronic care management services.  SDOH (Social Determinants of Health) assessments and interventions performed: SDOH Interventions    Flowsheet Row Patient Outreach Telephone from 10/13/2022 in [place] POPULATION HEALTH DEPARTMENT Telephone from 07/16/2022 in [place] POPULATION HEALTH DEPARTMENT Patient Outreach Telephone from 06/30/2022 in [place] POPULATION HEALTH DEPARTMENT Patient Outreach Telephone from 05/25/2022 in [place] POPULATION HEALTH DEPARTMENT Patient Outreach Telephone from 04/19/2022 in [place] POPULATION HEALTH DEPARTMENT Patient Outreach Telephone from 04/14/2022 in [place] POPULATION HEALTH DEPARTMENT  SDOH Interventions        Food Insecurity Interventions -- -- -- -- Intervention Not Indicated --  Housing Interventions -- -- -- Intervention Not Indicated -- --  Transportation Interventions Payor Benefit Payor Benefit Payor Benefit  [assisted with arranging transportation] Payor Benefit  [Assisted with arranging transportation to upcoming appointments] -- Payor Benefit  [facility] assisted with arranging transportation with  [employer]]  Utilities Interventions -- -- -- -- Intervention Not Indicated --       Care Plan  No Known Allergies  Medications Reviewed Today     Reviewed by James Dach, RN (Registered Nurse) on 11/02/22 at 1018  Med List Status: <None>   Medication Order Taking? Sig Documenting Provider Last Dose Status Informant  albuterol (PROVENTIL) (2.5 MG/3ML) 0.083% nebulizer solution [id number] Yes Inhale 3 mLs (2.5 mg total) by nebulization every 4 (four) hours as needed for wheezing or shortness of breath. James Frisk, MD Taking Active   albuterol (VENTOLIN HFA) 108 [phone] Base) MCG/ACT inhaler [id number] Yes Inhale 2 puffs into the lungs every 6 (six) hours as needed for wheezing or shortness of breath. James Frisk, MD Taking Active   atorvastatin (LIPITOR) 10 MG tablet [id number] Yes Take 1 tablet (10 mg total) by mouth daily. James Frisk, MD Taking Active   Budeson-Glycopyrrol-Formoterol [facility] AEROSPHERE) 160-9-4.8 MCG/ACT James [name] [id number] Yes Inhale 2 puffs into the lungs in the morning and at bedtime. James Frisk, MD Taking Active            Med Note James [name]   Tue Nov 02, 2022 10:18 AM)    folic acid (FOLVITE) 1 MG tablet [id number] Yes Take 1 tablet (1 mg total) by mouth daily. James Frisk, MD Taking Active   furosemide (LASIX) 40 MG tablet [id number] Yes Take 1 tablet (40 mg total) by mouth daily. James Frisk, MD Taking Active   glipiZIDE (GLUCOTROL XL) 5 MG 24 hr tablet [id number] Yes Take 1 tablet (5 mg total) by mouth daily with breakfast. James Frisk, MD Taking Active   losartan (COZAAR) 100 MG tablet [id number] Yes Take 1 tablet (100 mg total) by mouth daily. James Frisk,  MD Taking Active Self, Pharmacy Records  metoprolol succinate (TOPROL-XL) 50 MG 24 hr tablet [id number] Yes Take 1 tablet (50 mg total) by mouth daily. Take with or immediately following [name] meal. James Frisk, MD Taking Active Self, Pharmacy Records  nystatin cream  (MYCOSTATIN) [id number] Yes Apply to affected areas 3 (three) times daily. James Frisk, MD Taking Active   pantoprazole (PROTONIX) 40 MG tablet [id number] Yes Take 1 tablet (40 mg total) by mouth daily. James Frisk, MD Taking Active   predniSONE (DELTASONE) 10 MG tablet [id number] Yes Take 1 tablet (10 mg total) by mouth daily with breakfast. James Frisk, MD Taking Active   pregabalin (LYRICA) 100 MG capsule [id number] Yes Take 1 capsule (100 mg total) by mouth 2 (two) times daily. James Register, MD Taking Active             Patient Active Problem List   Diagnosis Date Noted   Dysphagia 10/01/2022   Alcohol dependence in remission (HCC) 03/02/2022   Aortic root dilation (HCC) 03/02/2022   Vision changes 12/29/2021   Oropharyngeal dysphagia 12/29/2021   Onychomycosis 12/29/2021   Malnutrition of moderate degree 12/05/2021   Tinea cruris 07/21/2021   Multiple lung nodules on CT 06/11/2021   COPD with chronic bronchitis (HCC) 02/01/2021   GERD (gastroesophageal reflux disease) 02/01/2021   Neuropathy 09/29/2020   Bronchiectasis (HCC) 09/24/2019   Type 2 diabetes mellitus (HCC) 07/24/2019   Other atopic dermatitis 05/01/2019   Chronic respiratory failure with hypoxia (HCC) 04/23/2019   Intellectual disability 04/23/2019   HTN (hypertension) 05/17/2016   Former tobacco use 04/06/2016   COPD mixed type (HCC) 03/26/2016    Conditions to be addressed/monitored per PCP order:  COPD  Care Plan : RN Care Manager Plan of Care  Updates made by James Dach, RN since 11/02/2022 12:00 AM     Problem: Chronic Disease Management needs in patient with COPD   Priority: High     Long-Range Goal: Plan of care established for for Chronic Disease management for patient with COPD   Start Date: 11/13/2020  Expected End Date: 12/03/2022  Priority: High  Note:   Current Barriers:  Care Coordination needs related to Limited access to food  Chronic Disease Management support  and education needs related to COPD    RNCM Clinical Goal(s):  Patient will verbalize understanding of plan for management of COPD as evidenced by taking all medications as prescribed, attending all scheduled appointments and contacting provider with questions and concerns take all medications exactly as prescribed and will call provider for medication related questions as evidenced by refilling medications and taking as prescribed    attend all scheduled medical appointments: [facility] on 12/01/22 as evidenced by provider notes in EMR        demonstrate improved adherence to prescribed treatment plan for COPD as evidenced by taking all medications as prescribed, attending all scheduled appointments and contacting provider with questions and concerns continue to work with RN Care Manager and/or Social Worker to address care management and care coordination needs related to COPD as evidenced by adherence to CM Team Scheduled appointments     through collaboration with RN Care manager, provider, and care team.   Interventions: Inter-disciplinary care team collaboration (see longitudinal plan of care) Evaluation of current treatment plan related to  self management and patient's adherence to plan as established by provider Assisted patient with rescheduling upcoming appointment with [facility]    COPD: (Status: Goal on Track (progressing): YES.) Long  Term Goal  Reviewed medications with patient, assisted with requesting needed refills Advised patient to self assesses COPD action plan zone and make appointment with provider if in the yellow zone for 48 hours without improvement Provided education about and advised patient to utilize infection prevention strategies to reduce risk of respiratory infection Discussed the importance of adequate rest and management of fatigue with COPD Provided encouragement to continue to avoid drinking alcohol and dipping Discussed triggers and avoiding being around fires and fumes   Discussed the importance of handwashing and wearing [name] mask in public, explained the importance of infection prevention Advised patient to schedule follow up with Pulmonology/Dr. Tonia Brooms [phone]  Patient Goals/Self-Care Activities: Take medications as prescribed   Attend all scheduled provider appointments Call pharmacy for medication refills 3-7 days in advance of running out of medications Perform all self care activities independently  Call provider office for new concerns or questions  - avoid second hand smoke - limit outdoor activity during cold weather - keep follow-up appointments: 05/13/22 with Cardiology - eat healthy/prescribed diet: DASH - get at least 7 to 8 hours of sleep at night       Follow Up:  Patient agrees to Care Plan and Follow-up.  Plan: The Managed Medicaid care management team will reach out to the patient again over the next 30 days.  Date/time of next scheduled RN care management/care coordination outreach:  11/24/22 at 10:30am  [name] RN, BSN [place]  Value-Based Care Institute [facility] RN Care Coordinator [phone]

## 2022-11-02 NOTE — Patient Instructions (Signed)
Visit Information  James [name] was given information about Medicaid Managed Care team care coordination services as a part of their Healthy [facility] Medicaid benefit. James [name] verbally consented to engagement with the [facility] Managed Care team.   If you are experiencing a medical emergency, please call 911 or report to your local emergency department or urgent care.   If you have a non-emergency medical problem during routine business hours, please contact your provider's office and ask to speak with a nurse.   For questions related to your Healthy [facility] health plan, please call: [phone] or visit the homepage here: [email or web address]  If you would like to schedule transportation through your Healthy [facility] plan, please call the following number at least 2 days in advance of your appointment: [phone]  For information about your ride after you set it up, call Ride Assist at [phone]. Use this number to activate a Will Call pickup, or if your transportation is late for a scheduled pickup. Use this number, too, if you need to make a change or cancel a previously scheduled reservation.  If you need transportation services right away, call [phone]. The after-hours call center is staffed 24 hours to handle ride assistance and urgent reservation requests (including discharges) 365 days a year. Urgent trips include sick visits, hospital discharge requests and life-sustaining treatment.  Call the [facility] Line at [phone], at any time, 24 hours a day, 7 days a week. If you are in danger or need immediate medical attention call 911.  If you would like help to quit smoking, call 1-800-QUIT-NOW ((786)758-6341) OR Espaol: 1-855-Djelo-Ya (8-315-176-1607) o para ms informacin haga clic aqu or Text READY to 371-062 to register via text  [name],   Please see education materials related to COPD provided as print  materials.   The patient verbalized understanding of instructions, educational materials, and care plan provided today and agreed to receive a mailed copy of patient instructions, educational materials, and care plan.   Telephone follow up appointment with Managed Medicaid care management team member scheduled for:11/24/22 at 10:30am  James Emms RN, BSN [place]  [facility] RN Care Coordinator [phone]   Following is a copy of your plan of care:  Care Plan : RN Care Manager Plan of Care  Updates made by James Dach, RN since 11/02/2022 12:00 AM     Problem: Chronic Disease Management needs in patient with COPD   Priority: High     Long-Range Goal: Plan of care established for for Chronic Disease management for patient with COPD   Start Date: 11/13/2020  Expected End Date: 12/03/2022  Priority: High  Note:   Current Barriers:  Care Coordination needs related to Limited access to food  Chronic Disease Management support and education needs related to COPD    RNCM Clinical Goal(s):  Patient will verbalize understanding of plan for management of COPD as evidenced by taking all medications as prescribed, attending all scheduled appointments and contacting provider with questions and concerns take all medications exactly as prescribed and will call provider for medication related questions as evidenced by refilling medications and taking as prescribed    attend all scheduled medical appointments: [facility] on 12/01/22 as evidenced by provider notes in EMR        demonstrate improved adherence to prescribed treatment plan for COPD as evidenced by taking all medications as prescribed, attending all scheduled appointments and contacting provider with questions and concerns continue to work with  RN Care Manager and/or Social Worker to address care management and care coordination needs related to COPD as evidenced by adherence to CM Team Scheduled  appointments     through collaboration with RN Care manager, provider, and care team.   Interventions: Inter-disciplinary care team collaboration (see longitudinal plan of care) Evaluation of current treatment plan related to  self management and patient's adherence to plan as established by provider Assisted patient with rescheduling upcoming appointment with [facility]    COPD: (Status: Goal on Track (progressing): YES.) Long Term Goal  Reviewed medications with patient, assisted with requesting needed refills Advised patient to self assesses COPD action plan zone and make appointment with provider if in the yellow zone for 48 hours without improvement Provided education about and advised patient to utilize infection prevention strategies to reduce risk of respiratory infection Discussed the importance of adequate rest and management of fatigue with COPD Provided encouragement to continue to avoid drinking alcohol and dipping Discussed triggers and avoiding being around fires and fumes  Discussed the importance of handwashing and wearing a mask in public, explained the importance of infection prevention Advised patient to schedule follow up with Pulmonology/Dr. Tonia Brooms [phone]  Patient Goals/Self-Care Activities: Take medications as prescribed   Attend all scheduled provider appointments Call pharmacy for medication refills 3-7 days in advance of running out of medications Perform all self care activities independently  Call provider office for new concerns or questions  - avoid second hand smoke - limit outdoor activity during cold weather - keep follow-up appointments: 05/13/22 with Cardiology - eat healthy/prescribed diet: DASH - get at least 7 to 8 hours of sleep at night

## 2022-11-05 DIAGNOSIS — F79 Unspecified intellectual disabilities: Secondary | ICD-10-CM | POA: Diagnosis not present

## 2022-11-05 DIAGNOSIS — J9601 Acute respiratory failure with hypoxia: Secondary | ICD-10-CM | POA: Diagnosis not present

## 2022-11-05 DIAGNOSIS — J9621 Acute and chronic respiratory failure with hypoxia: Secondary | ICD-10-CM | POA: Diagnosis not present

## 2022-11-07 DIAGNOSIS — F79 Unspecified intellectual disabilities: Secondary | ICD-10-CM | POA: Diagnosis not present

## 2022-11-07 DIAGNOSIS — J9621 Acute and chronic respiratory failure with hypoxia: Secondary | ICD-10-CM | POA: Diagnosis not present

## 2022-11-07 DIAGNOSIS — J9601 Acute respiratory failure with hypoxia: Secondary | ICD-10-CM | POA: Diagnosis not present

## 2022-11-08 ENCOUNTER — Ambulatory Visit: Payer: Medicaid Other | Admitting: Podiatry

## 2022-11-22 ENCOUNTER — Observation Stay (HOSPITAL_COMMUNITY)
Admission: EM | Admit: 2022-11-22 | Discharge: 2022-11-23 | Disposition: A | Discharge status: Home / Self Care | Payer: Medicaid Other | Attending: Internal Medicine | Admitting: Internal Medicine

## 2022-11-22 ENCOUNTER — Encounter (HOSPITAL_COMMUNITY): Payer: Self-pay | Admitting: Emergency Medicine

## 2022-11-22 ENCOUNTER — Other Ambulatory Visit: Payer: Self-pay

## 2022-11-22 ENCOUNTER — Emergency Department (HOSPITAL_COMMUNITY): Payer: Medicaid Other

## 2022-11-22 DIAGNOSIS — R918 Other nonspecific abnormal finding of lung field: Secondary | ICD-10-CM | POA: Diagnosis not present

## 2022-11-22 DIAGNOSIS — I5032 Chronic diastolic (congestive) heart failure: Secondary | ICD-10-CM | POA: Insufficient documentation

## 2022-11-22 DIAGNOSIS — R0602 Shortness of breath: Secondary | ICD-10-CM | POA: Diagnosis not present

## 2022-11-22 DIAGNOSIS — Z79899 Other long term (current) drug therapy: Secondary | ICD-10-CM | POA: Diagnosis not present

## 2022-11-22 DIAGNOSIS — E1140 Type 2 diabetes mellitus with diabetic neuropathy, unspecified: Secondary | ICD-10-CM | POA: Insufficient documentation

## 2022-11-22 DIAGNOSIS — I110 Hypertensive heart disease with heart failure: Secondary | ICD-10-CM | POA: Diagnosis not present

## 2022-11-22 DIAGNOSIS — Z7984 Long term (current) use of oral hypoglycemic drugs: Secondary | ICD-10-CM | POA: Insufficient documentation

## 2022-11-22 DIAGNOSIS — D509 Iron deficiency anemia, unspecified: Secondary | ICD-10-CM | POA: Insufficient documentation

## 2022-11-22 DIAGNOSIS — J9620 Acute and chronic respiratory failure, unspecified whether with hypoxia or hypercapnia: Secondary | ICD-10-CM | POA: Diagnosis present

## 2022-11-22 DIAGNOSIS — J9621 Acute and chronic respiratory failure with hypoxia: Secondary | ICD-10-CM | POA: Insufficient documentation

## 2022-11-22 DIAGNOSIS — R000 Tachycardia, unspecified: Secondary | ICD-10-CM | POA: Diagnosis not present

## 2022-11-22 DIAGNOSIS — M79605 Pain in left leg: Secondary | ICD-10-CM | POA: Diagnosis not present

## 2022-11-22 DIAGNOSIS — J189 Pneumonia, unspecified organism: Principal | ICD-10-CM | POA: Insufficient documentation

## 2022-11-22 DIAGNOSIS — I2489 Other forms of acute ischemic heart disease: Secondary | ICD-10-CM | POA: Insufficient documentation

## 2022-11-22 DIAGNOSIS — J441 Chronic obstructive pulmonary disease with (acute) exacerbation: Principal | ICD-10-CM | POA: Diagnosis present

## 2022-11-22 DIAGNOSIS — D72829 Elevated white blood cell count, unspecified: Secondary | ICD-10-CM | POA: Insufficient documentation

## 2022-11-22 DIAGNOSIS — J439 Emphysema, unspecified: Secondary | ICD-10-CM | POA: Diagnosis not present

## 2022-11-22 DIAGNOSIS — Z87891 Personal history of nicotine dependence: Secondary | ICD-10-CM | POA: Insufficient documentation

## 2022-11-22 DIAGNOSIS — I10 Essential (primary) hypertension: Secondary | ICD-10-CM | POA: Diagnosis not present

## 2022-11-22 DIAGNOSIS — S2243XA Multiple fractures of ribs, bilateral, initial encounter for closed fracture: Secondary | ICD-10-CM | POA: Diagnosis not present

## 2022-11-22 LAB — TROPONIN I (HIGH SENSITIVITY)
Troponin I (High Sensitivity): 19 ng/L — ABNORMAL HIGH (ref <18)
Troponin I (High Sensitivity): 18 ng/L — ABNORMAL HIGH (ref <18)

## 2022-11-22 LAB — GLUCOSE, CAPILLARY
Glucose-Capillary: 341 mg/dL — ABNORMAL HIGH (ref 70–99)
Glucose-Capillary: 171 mg/dL — ABNORMAL HIGH (ref 70–99)
Glucose-Capillary: 143 mg/dL — ABNORMAL HIGH (ref 70–99)

## 2022-11-22 LAB — COMPREHENSIVE METABOLIC PANEL
ALT: 30 U/L (ref 0–44)
AST: 26 U/L (ref 15–41)
Albumin: 3.4 g/dL — ABNORMAL LOW (ref 3.5–5.0)
Alkaline Phosphatase: 42 U/L (ref 38–126)
Anion gap: 9 (ref 5–15)
BUN: 21 mg/dL (ref 8–23)
CO2: 25 mmol/L (ref 22–32)
Calcium: 9.2 mg/dL (ref 8.9–10.3)
Chloride: 105 mmol/L (ref 98–111)
Creatinine, Ser: 1.11 mg/dL (ref 0.61–1.24)
GFR, Estimated: >60 mL/min (ref >60)
Glucose, Bld: 130 mg/dL — ABNORMAL HIGH (ref 70–99)
Potassium: 4.2 mmol/L (ref 3.5–5.1)
Sodium: 139 mmol/L (ref 135–145)
Total Bilirubin: 0.7 mg/dL (ref <1.2)
Total Protein: 5.9 g/dL — ABNORMAL LOW (ref 6.5–8.1)

## 2022-11-22 LAB — BLOOD GAS, VENOUS
Acid-Base Excess: 1.3 mmol/L (ref 0.0–2.0)
Bicarbonate: 27.2 mmol/L (ref 20.0–28.0)
Drawn by: 623331
O2 Saturation: 93.1 %
Patient temperature: 37.5
pCO2, Ven: 48 mm[Hg] (ref 44–60)
pH, Ven: 7.36 (ref 7.25–7.43)
pO2, Ven: 66 mm[Hg] — ABNORMAL HIGH (ref 32–45)

## 2022-11-22 LAB — CBC WITH DIFFERENTIAL/PLATELET
Abs Immature Granulocytes: 0.06 10*3/uL (ref 0.00–0.07)
Basophils Absolute: 0.1 10*3/uL (ref 0.0–0.1)
Basophils Relative: 0 %
Eosinophils Absolute: 0.2 10*3/uL (ref 0.0–0.5)
Eosinophils Relative: 1 %
HCT: 35.4 % — ABNORMAL LOW (ref 39.0–52.0)
Hemoglobin: 10.9 g/dL — ABNORMAL LOW (ref 13.0–17.0)
Immature Granulocytes: 0 %
Lymphocytes Relative: 11 %
Lymphs Abs: 1.7 10*3/uL (ref 0.7–4.0)
MCH: 27.7 pg (ref 26.0–34.0)
MCHC: 30.8 g/dL (ref 30.0–36.0)
MCV: 90.1 fL (ref 80.0–100.0)
Monocytes Absolute: 1.5 10*3/uL — ABNORMAL HIGH (ref 0.1–1.0)
Monocytes Relative: 9 %
Neutro Abs: 12.1 10*3/uL — ABNORMAL HIGH (ref 1.7–7.7)
Neutrophils Relative %: 79 %
Platelets: 280 10*3/uL (ref 150–400)
RBC: 3.93 MIL/uL — ABNORMAL LOW (ref 4.22–5.81)
RDW: 14.3 % (ref 11.5–15.5)
WBC: 15.6 10*3/uL — ABNORMAL HIGH (ref 4.0–10.5)
nRBC: 0 % (ref 0.0–0.2)

## 2022-11-22 LAB — BRAIN NATRIURETIC PEPTIDE: B Natriuretic Peptide: 106.6 pg/mL — ABNORMAL HIGH (ref 0.0–100.0)

## 2022-11-22 LAB — PROCALCITONIN: Procalcitonin: <0.1 ng/mL

## 2022-11-22 LAB — CBG MONITORING, ED: Glucose-Capillary: 188 mg/dL — ABNORMAL HIGH (ref 70–99)

## 2022-11-22 MED ORDER — DEXTROSE 5 % IV SOLN
500.0000 mg | Freq: Once | INTRAVENOUS | Status: AC
  Administered 2022-11-22: 500 mg via INTRAVENOUS
  Filled 2022-11-22: qty 5

## 2022-11-22 MED ORDER — COLCHICINE 0.6 MG PO TABS
1.2000 mg | ORAL_TABLET | Freq: Once | ORAL | Status: AC
  Administered 2022-11-22: 1.2 mg via ORAL
  Filled 2022-11-22: qty 2

## 2022-11-22 MED ORDER — SODIUM CHLORIDE 0.9 % IV SOLN
2.0000 g | INTRAVENOUS | Status: DC
  Administered 2022-11-23: 2 g via INTRAVENOUS
  Filled 2022-11-22: qty 20

## 2022-11-22 MED ORDER — INSULIN ASPART 100 UNIT/ML IJ SOLN
0.0000 [IU] | Freq: Three times a day (TID) | INTRAMUSCULAR | Status: DC
  Administered 2022-11-22: 3 [IU] via SUBCUTANEOUS
  Administered 2022-11-22: 11 [IU] via SUBCUTANEOUS
  Administered 2022-11-23 (×3): 3 [IU] via SUBCUTANEOUS

## 2022-11-22 MED ORDER — COLCHICINE 0.6 MG PO TABS
0.6000 mg | ORAL_TABLET | Freq: Once | ORAL | Status: DC
  Filled 2022-11-22: qty 1

## 2022-11-22 MED ORDER — IPRATROPIUM-ALBUTEROL 0.5-2.5 (3) MG/3ML IN SOLN
3.0000 mL | RESPIRATORY_TRACT | Status: AC
  Administered 2022-11-22 – 2022-11-23 (×6): 3 mL via RESPIRATORY_TRACT
  Filled 2022-11-22 (×6): qty 3

## 2022-11-22 MED ORDER — ATORVASTATIN CALCIUM 10 MG PO TABS
10.0000 mg | ORAL_TABLET | Freq: Every day | ORAL | Status: DC
  Administered 2022-11-22 – 2022-11-23 (×2): 10 mg via ORAL
  Filled 2022-11-22 (×2): qty 1

## 2022-11-22 MED ORDER — PREDNISONE 20 MG PO TABS
40.0000 mg | ORAL_TABLET | Freq: Every day | ORAL | Status: DC
  Administered 2022-11-22 – 2022-11-23 (×2): 40 mg via ORAL
  Filled 2022-11-22 (×2): qty 2

## 2022-11-22 MED ORDER — PREGABALIN 100 MG PO CAPS
100.0000 mg | ORAL_CAPSULE | Freq: Once | ORAL | Status: AC
  Administered 2022-11-22: 100 mg via ORAL
  Filled 2022-11-22: qty 1

## 2022-11-22 MED ORDER — FUROSEMIDE 10 MG/ML IJ SOLN
40.0000 mg | Freq: Once | INTRAMUSCULAR | Status: AC
  Administered 2022-11-22: 40 mg via INTRAVENOUS
  Filled 2022-11-22: qty 4

## 2022-11-22 MED ORDER — AZITHROMYCIN 500 MG PO TABS
500.0000 mg | ORAL_TABLET | Freq: Every day | ORAL | Status: DC
  Administered 2022-11-23: 500 mg via ORAL
  Filled 2022-11-22: qty 1

## 2022-11-22 MED ORDER — IPRATROPIUM-ALBUTEROL 0.5-2.5 (3) MG/3ML IN SOLN
3.0000 mL | RESPIRATORY_TRACT | Status: AC
  Administered 2022-11-22 (×3): 3 mL via RESPIRATORY_TRACT
  Filled 2022-11-22: qty 9

## 2022-11-22 MED ORDER — MAGNESIUM SULFATE 2 GM/50ML IV SOLN
2.0000 g | Freq: Once | INTRAVENOUS | Status: AC
  Administered 2022-11-22: 2 g via INTRAVENOUS
  Filled 2022-11-22: qty 50

## 2022-11-22 MED ORDER — RIVAROXABAN 10 MG PO TABS
10.0000 mg | ORAL_TABLET | Freq: Every day | ORAL | Status: DC
  Administered 2022-11-22 – 2022-11-23 (×2): 10 mg via ORAL
  Filled 2022-11-22 (×2): qty 1

## 2022-11-22 MED ORDER — PANTOPRAZOLE SODIUM 40 MG PO TBEC
40.0000 mg | DELAYED_RELEASE_TABLET | Freq: Every day | ORAL | Status: DC
  Administered 2022-11-22 – 2022-11-23 (×2): 40 mg via ORAL
  Filled 2022-11-22 (×2): qty 1

## 2022-11-22 MED ORDER — SODIUM CHLORIDE 0.9 % IV SOLN
2.0000 g | Freq: Once | INTRAVENOUS | Status: AC
  Administered 2022-11-22: 2 g via INTRAVENOUS
  Filled 2022-11-22: qty 20

## 2022-11-22 NOTE — ED Notes (Signed)
PT complaining of bilateral leg pain and left foot pain. Expressing concerns about gout

## 2022-11-22 NOTE — ED Provider Notes (Signed)
[place] EMERGENCY DEPARTMENT AT [facility] Provider Note   CSN: [id number] Arrival date & time: 11/22/22  4098     History  Chief Complaint  Patient presents with   Shortness of Breath    James [name] is a 62 y.o. male.  62 yo M with a chief complaint of difficulty breathing.  This started about 2 hours ago.  Feels just like his COPD in the past.  Cough denies fevers.  Denies chest pain or pressure.  Denies history of heart failure.  He feels a bit better after 7.5 mg of albuterol and Solu-Medrol.   Shortness of Breath      Home Medications Prior to Admission medications   Medication Sig Start Date End Date Taking? Authorizing Provider  albuterol (PROVENTIL) (2.5 MG/3ML) 0.083% nebulizer solution Inhale 3 mLs (2.5 mg total) by nebulization every 4 (four) hours as needed for wheezing or shortness of breath. Patient taking differently: Take 2.5 mg by nebulization at bedtime. 09/28/22  Yes [name], MD  albuterol (VENTOLIN HFA) 108 (90 Base) MCG/ACT inhaler Inhale 2 puffs into the lungs every 6 (six) hours as needed for wheezing or shortness of breath. Patient taking differently: Inhale 2 puffs into the lungs in the morning and at bedtime. 09/28/22  Yes [name], MD  atorvastatin (LIPITOR) 10 MG tablet Take 1 tablet (10 mg total) by mouth daily. 09/28/22  Yes [name], MD  Budeson-Glycopyrrol-Formoterol (BREZTRI AEROSPHERE) 160-9-4.8 MCG/ACT AERO Inhale 2 puffs into the lungs in the morning and at bedtime. 09/28/22  Yes [name], MD  folic acid (FOLVITE) 1 MG tablet Take 1 tablet (1 mg total) by mouth daily. 09/28/22  Yes [name], MD  furosemide (LASIX) 40 MG tablet Take 1 tablet (40 mg total) by mouth daily. 09/28/22  Yes [name], MD  glipiZIDE (GLUCOTROL XL) 5 MG 24 hr tablet Take 1 tablet (5 mg total) by mouth daily with breakfast. 09/28/22  Yes [name], MD  losartan (COZAAR) 100 MG tablet Take 1 tablet  (100 mg total) by mouth daily. 06/09/22  Yes [name], MD  metoprolol succinate (TOPROL-XL) 50 MG 24 hr tablet Take 1 tablet (50 mg total) by mouth daily. Take with or immediately following a meal. 06/09/22  Yes [name], MD  nystatin cream (MYCOSTATIN) Apply to affected areas 3 (three) times daily. Patient taking differently: Apply 1 Application topically as needed (irritation). 08/17/22  Yes [name], MD  pantoprazole (PROTONIX) 40 MG tablet Take 1 tablet (40 mg total) by mouth daily. 08/17/22  Yes [name], MD  predniSONE (DELTASONE) 10 MG tablet Take 1 tablet (10 mg total) by mouth daily with breakfast. 08/24/22  Yes [name], MD  pregabalin (LYRICA) 100 MG capsule Take 1 capsule (100 mg total) by mouth 2 (two) times daily. Patient taking differently: Take 200 mg by mouth daily. 10/05/22  Yes Hoy Register, MD      Allergies    Patient has no known allergies.    Review of Systems   Review of Systems  Respiratory:  Positive for shortness of breath.     Physical Exam Updated Vital Signs BP 129/69   Pulse (!) 107   Temp 99.1 F (37.3 C) (Oral)   Resp (!) 26   Ht 5\' 9"  (1.753 m)   Wt 90.7 kg   SpO2 98%   BMI 29.53 kg/m  Physical Exam Vitals and nursing note reviewed.  Constitutional:      Appearance: He is well-developed.  HENT:     Head: Normocephalic and atraumatic.  Eyes:     Pupils: Pupils are equal, round, and reactive to light.  Neck:     Vascular: No JVD.  Cardiovascular:     Rate and Rhythm: Normal rate and regular rhythm.     Heart sounds: No murmur heard.    No friction rub. No gallop.  Pulmonary:     Effort: No respiratory distress.     Breath sounds: Wheezing present.     Comments: Diminished breath sounds in all fields with long expiratory effort and wheezes. Abdominal:     General: There is no distension.     Tenderness: There is no abdominal tenderness. There is no guarding or rebound.  Musculoskeletal:         General: Normal range of motion.     Cervical back: Normal range of motion and neck supple.  Skin:    Coloration: Skin is not pale.     Findings: No rash.  Neurological:     Mental Status: He is alert and oriented to person, place, and time.  Psychiatric:        Behavior: Behavior normal.     ED Results / Procedures / Treatments   Labs (all labs ordered are listed, but only abnormal results are displayed) Labs Reviewed  CBC WITH DIFFERENTIAL/PLATELET - Abnormal; Notable for the following components:      Result Value   WBC 15.6 (*)    RBC 3.93 (*)    Hemoglobin 10.9 (*)    HCT 35.4 (*)    Neutro Abs 12.1 (*)    Monocytes Absolute 1.5 (*)    All other components within normal limits  COMPREHENSIVE METABOLIC PANEL - Abnormal; Notable for the following components:   Glucose, Bld 130 (*)    Total Protein 5.9 (*)    Albumin 3.4 (*)    All other components within normal limits  BRAIN NATRIURETIC PEPTIDE - Abnormal; Notable for the following components:   B Natriuretic Peptide 106.6 (*)    All other components within normal limits  TROPONIN I (HIGH SENSITIVITY) - Abnormal; Notable for the following components:   Troponin I (High Sensitivity) 19 (*)    All other components within normal limits  CULTURE, BLOOD (ROUTINE X 2)  CULTURE, BLOOD (ROUTINE X 2)    EKG None  Radiology DG Chest Port 1 View  Result Date: 11/22/2022 CLINICAL DATA:  Shortness of breath. EXAM: PORTABLE CHEST 1 VIEW COMPARISON:  07/12/2022 and CT chest 02/22/2022. FINDINGS: Trachea is midline. Heart size stable. Thoracic aorta is calcified. Bullous emphysema. Superimposed mixed interstitial and airspace opacification in the mid and lower lung zones. Costophrenic angles were not included on the images. Numerous bilateral rib fractures. Old left clavicle fracture. IMPRESSION: 1. Mid and lower lung zone predominant mixed interstitial and airspace opacification, indicative of pneumonia. 2.  Emphysema  (ICD10-J43.9). Electronically Signed   By: Leanna Battles M.D.   On: 11/22/2022 09:29    Procedures .Critical Care  Performed by: Melene Plan, DO Authorized by: Melene Plan, DO   Critical care provider statement:    Critical care time (minutes):  35   Critical care time was exclusive of:  Separately billable procedures and treating other patients   Critical care was time spent personally by me on the following activities:  Development of treatment plan with patient or surrogate, discussions with consultants, evaluation of patient's response to treatment, examination  of patient, ordering and review of laboratory studies, ordering and review of radiographic studies, ordering and performing treatments and interventions, pulse oximetry, re-evaluation of patient's condition and review of old charts   Care discussed with: admitting provider       Medications Ordered in ED Medications  colchicine tablet 1.2 mg (1.2 mg Oral Given 11/22/22 0901)    Followed by  colchicine tablet 0.6 mg (has no administration in time range)  cefTRIAXone (ROCEPHIN) 2 g in sodium chloride 0.9 % 100 mL IVPB (has no administration in time range)  azithromycin (ZITHROMAX) 500 mg in dextrose 5 % 250 mL IVPB (has no administration in time range)  ipratropium-albuterol (DUONEB) 0.5-2.5 (3) MG/3ML nebulizer solution 3 mL (3 mLs Nebulization Given 11/22/22 0842)  magnesium sulfate IVPB 2 g 50 mL (0 g Intravenous Stopped 11/22/22 0902)  pregabalin (LYRICA) capsule 100 mg (100 mg Oral Given 11/22/22 0901)    ED Course/ Medical Decision Making/ A&P                                 Medical Decision Making Amount and/or Complexity of Data Reviewed Labs: ordered. Radiology: ordered.  Risk Prescription drug management.   62 yo M with a significant past medical history of COPD comes in with a chief complaints of difficulty breathing.  Excess feels similar of his last COPD exacerbation.  Patient is very tight on initial  exam.  Will give 3 DuoNebs back-to-back and magnesium and reassess.  Patient continues to feel better but does not feel quite well enough to go home.  He has a leukocytosis with neutrophilic predominance.  No significant electrolyte abnormalities.  Chest x-ray independently interpreted by me with new right lower lobe infiltrate.  Will start on community-acquired pneumonia antibiotics.  Will discuss with medicine for admission.  The patients results and plan were reviewed and discussed.   Any x-rays performed were independently reviewed by myself.   Differential diagnosis were considered with the presenting HPI.  Medications  colchicine tablet 1.2 mg (1.2 mg Oral Given 11/22/22 0901)    Followed by  colchicine tablet 0.6 mg (has no administration in time range)  cefTRIAXone (ROCEPHIN) 2 g in sodium chloride 0.9 % 100 mL IVPB (has no administration in time range)  azithromycin (ZITHROMAX) 500 mg in dextrose 5 % 250 mL IVPB (has no administration in time range)  ipratropium-albuterol (DUONEB) 0.5-2.5 (3) MG/3ML nebulizer solution 3 mL (3 mLs Nebulization Given 11/22/22 0842)  magnesium sulfate IVPB 2 g 50 mL (0 g Intravenous Stopped 11/22/22 0902)  pregabalin (LYRICA) capsule 100 mg (100 mg Oral Given 11/22/22 0901)    Vitals:   11/22/22 0829 11/22/22 0831 11/22/22 0845 11/22/22 0900  BP:   113/69 129/69  Pulse:   99 (!) 107  Resp:   17 (!) 26  Temp:  99.1 F (37.3 C)    TempSrc:  Oral    SpO2: 92%  98% 98%  Weight:  90.7 kg    Height:  5\' 9"  (1.753 m)      Final diagnoses:  Community acquired pneumonia of right lower lobe of lung    Admission/ observation were discussed with the admitting physician, patient and/or family and they are comfortable with the plan.          Final Clinical Impression(s) / ED Diagnoses Final diagnoses:  Community acquired pneumonia of right lower lobe of lung    Rx / DC Orders ED  Discharge Orders     None         Melene Plan,  [place] 11/22/22 1011

## 2022-11-22 NOTE — H&P (Cosign Needed)
Date: 11/22/2022               Patient Name:  James [name] MRN: [id number]  DOB: May 30, 1960 Age / Sex: 62 y.o., male   PCP: [name], MD         Medical Service: Internal Medicine Teaching Service         Attending Physician: Dr. Reymundo Poll, MD      First Contact: Dr. Laretta Bolster, MD Pager [phone]    Second Contact: Dr. Modena Slater, DO Pager [phone]         After Hours (After 5p/  First Contact Pager: [phone]  weekends / holidays): Second Contact Pager: [phone]   SUBJECTIVE   Chief Complaint: shortness of breath  History of Present Illness:   James [name] is a 62 yearold with PMH of COPD on 2L O2, type 2 diabetes, and HFpEF who presents to [facility] with shortness of breath.  Symptoms started around yesterday evening with mild shortness of breath.  He denies productive cough, but has been coughing more in the last few days.  This morning he woke up due to dyspnea around 630.  He called EMS and run sheet indicates that he was not hypoxic, mildly hypertensive at his home.  He was given nebulizers there, but continued to have shortness of breath without a hypoxia.  He denies fevers, myalgias, chills, sinus congestion, and nausea.  He has not been around sick contacts.  Also notes that he has left lower foot pain for the last several weeks.  Denies history of gout.  Pain is present to dorsal surface of foot, he describes it as a sharp pain.  Denies falls or trauma.  ED Course: HR initially elevated in 110s. With shortness of breath he was given multiple rounds of duionebs and Mg without improvement. CXR with consolidation noted and he was started on ceftriaxone and azithromycin.  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 Hypertension Heart failure with preserved ejection fraction History of alcohol abuse in remission  Meds:  Albuterol inhaler Atorvastatin 10  mg Budesonide-glycopyrrolate-formoterol 2 puffs daily Furosemide 40 mg daily Glipizide 5 mg daily Toprol succinate 50 mg daily Pantoprazole 40 mg daily Prednisone 10 mg daily Pregabalin 100 mg daily  Past Surgical History:  Procedure Laterality Date   ESOPHAGEAL MANOMETRY N/A 09/15/2022   Procedure: ESOPHAGEAL MANOMETRY (EM);  Surgeon: Hilarie Fredrickson, MD;  Location: [facility] ENDOSCOPY;  Service: Gastroenterology;  Laterality: N/A;   LEFT HEART CATH AND CORONARY ANGIOGRAPHY N/A 12/07/2021   Procedure: LEFT HEART CATH AND CORONARY ANGIOGRAPHY;  Surgeon: Runell Gess, MD;  Location: [facility] INVASIVE CV LAB;  Service: Cardiovascular;  Laterality: N/A;   LUNG REMOVAL, PARTIAL     VIDEO ASSISTED THORACOSCOPY (VATS)/THOROCOTOMY Left 06/11/2013   Procedure: VIDEO ASSISTED THORACOSCOPY (VATS)/THOROCOTOMY;  Surgeon: Kerin Perna, MD;  Location: [facility] OR;  Service: Thoracic;  Laterality: Left;   VIDEO BRONCHOSCOPY N/A 06/11/2013   Procedure: VIDEO BRONCHOSCOPY;  Surgeon: Kerin Perna, MD;  Location: [facility] OR;  Service: Thoracic;  Laterality: N/A;   VIDEO BRONCHOSCOPY N/A 08/10/2018   Procedure: VIDEO BRONCHOSCOPY WITH FLUORO;  Surgeon: Lorin Glass, MD;  Location: [facility] ENDOSCOPY;  Service: Endoscopy;  Laterality: N/A;    Social:  Lives With: Alone Occupation: Retired, 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 Level of Function:  Independent in ADLs, he reports that he drives to the grocery store and ambulates without assistance PCP: Previously followed with Dr. Delford Field, is following up with a separate provider and practice as he is retired Substances: History of tobacco use, stopped smoking about 10 years ago denies marijuana use, he drinks about 2 beers daily.  Reports that he has never had withdrawal symptoms from alcohol.  Family History:  Denies history of essential tremor  Allergies: Allergies as of 11/22/2022    (No Known Allergies)    Review of Systems: A complete ROS was negative except as per HPI.   OBJECTIVE:   Physical Exam: Blood pressure 125/61, pulse 94, temperature 99.1 F (37.3 C), temperature source Oral, resp. rate (!) 25, height 5\' 9"  (1.753 m), weight 90.7 kg, SpO2 90%.  Constitutional: Falls asleep during conversation, easily awoken Cardiovascular: regular rate and rhythm, no m/r/g Pulmonary/Chest: Pursed lip breathing on exhalation, decreased lung sounds throughout lungs bilaterally, wheezing present diffusely MSK: No lower extremity edema, left dorsum of foot is not erythematous without warmth.  Mild TTP to top of left foot, no deformities appreciated Neurological: alert & oriented x 3 Skin: extremities warm and well-perfused  Labs: CBC    Component Value Date/Time   WBC 15.6 (H) 11/22/2022 0841   RBC 3.93 (L) 11/22/2022 0841   HGB 10.9 (L) 11/22/2022 0841   HGB 11.4 (L) 06/29/2022 1204   HCT 35.4 (L) 11/22/2022 0841   HCT 35.1 (L) 06/29/2022 1204   PLT 280 11/22/2022 0841   PLT 267 06/29/2022 1204   MCV 90.1 11/22/2022 0841   MCV 91 06/29/2022 1204   MCH 27.7 11/22/2022 0841   MCHC 30.8 11/22/2022 0841   RDW 14.3 11/22/2022 0841   RDW 13.2 06/29/2022 1204   LYMPHSABS 1.7 11/22/2022 0841   LYMPHSABS 1.2 06/29/2022 1204   MONOABS 1.5 (H) 11/22/2022 0841   EOSABS 0.2 11/22/2022 0841   EOSABS 0.2 06/29/2022 1204   BASOSABS 0.1 11/22/2022 0841   BASOSABS 0.0 06/29/2022 1204     CMP     Component Value Date/Time   NA 139 11/22/2022 0841   NA 144 06/29/2022 1204   K 4.2 11/22/2022 0841   CL 105 11/22/2022 0841   CO2 25 11/22/2022 0841   GLUCOSE 130 (H) 11/22/2022 0841   BUN 21 11/22/2022 0841   BUN 26 06/29/2022 1204   CREATININE 1.11 11/22/2022 0841   CREATININE 0.79 10/26/2016 1103   CALCIUM 9.2 11/22/2022 0841   PROT 5.9 (L) 11/22/2022 0841   PROT 6.0 06/29/2022 1204   ALBUMIN 3.4 (L) 11/22/2022 0841   ALBUMIN 4.1 06/29/2022 1204   AST 26  11/22/2022 0841   ALT 30 11/22/2022 0841   ALT 62 (H) 06/23/2016 1226   ALKPHOS 42 11/22/2022 0841   BILITOT 0.7 11/22/2022 0841   BILITOT 0.2 06/29/2022 1204   GFRNONAA >60 11/22/2022 0841   GFRAA >60 09/25/2019 0320   Troponin 19 BNP 106.6  Imaging: CXR 11/18 IMPRESSION: 1. Mid and lower lung zone predominant mixed interstitial and airspace opacification, indicative of pneumonia. 2.  Emphysema (ICD10-J43.9).  EKG:  Sinus rhythm with pacs, no ST elevation or depression  ASSESSMENT & PLAN:   Assessment & Plan by Problem: Active Problems:   COPD exacerbation (HCC)   James [name] is a 62 y.o. person living with a history of end stage COPD, diabetes who presented with shortness of breath and admitted for COPD exacerbation on hospital day 0  Acute COPD exacerbation CAP Chronic hypoxic  respiratory failure Patient presents with 1 day duration of dyspnea, nonproductive cough. He denies other ROS that would point towards a pneumonia. He has history of recurrent pneumonia in recent years with multiple admission and one requiring intubation. With 2/ 3 cardinal symptoms will treat for COPD exacerbation. I will continue CAP coverage for now, getting procalcitonin.  He was maintaining stable saturations on home O2 of 2L. VBG reassuring with no signs of hypercapnia. PFT 1/24 with severe obstructive lung disease. This could be viral versus bacterial infection leading to COPD exacerbation. He reports adherence with inhalers -prednisone 40 mg for 5 days -continue ceftriaxone for 5 days and azithromycin 500 mg for 3 days total -pro-cal -duonebs scheduled  Subacute heart failure exacerbation Last echo 11/23 with EF 50-55%. BNP elevated at 106 today. He reports adherence with lasix 40 mg at home. He does not weight himself at home or have fluid restriction. Recent weight at PCP in September was 92 kg and weight in ED at 90.7 kg. Difficult to appreciate crackles on lung exam with distant lung  sounds and wheezing. -IV lasix 40 mg -repeat echo  Leukocytosis Likely in setting of chronic steroid use. -trend CBC  Demand ischemia Patient without chest pain. Troponin at 19 this AM, repeat with 18. He had cath done 12/23 in setting of elevated troponins with admission for COPD exacerbation 2/2 to CAP requiring intubation. Cath showed no obstruction of coronaries.  Left lower extremity pain Given 1.2 mg dose of colchicine in ED. He left lower extremities does not appear consistent with gout flare at this time.  -CTM  Chronic conditions History of oropharnygeal dysphagia Notes indicate this started following intubation 12/23.   Chronic normocytic anemia Hgb baseline 10-12. No recent iron studies in recent years. Likely anemia from chronic disease but will check iron studies. -iron studies  Diabetes A1c at 6.2 6/24. Home medications include glipizide 5 mg. He is on prednisone 10 mg for last 2-3 years. -ssi -hold glipizide  GERD -PPI 40 mg  Diet: Carb-Modified VTE: DOAC IVF: None,None Code: Full  Prior to Admission Living Arrangement: Home, living alone Anticipated Discharge Location: Home Barriers to Discharge: treating CAP, likely switch to PO abx tomorrow  Dispo: Admit patient to Observation with expected length of stay less than 2 midnights.  Signed: Rudene Christians, DO Internal Medicine Resident PGY-3  11/22/2022, 4:00 PM

## 2022-11-22 NOTE — ED Notes (Signed)
ED TO INPATIENT HANDOFF REPORT  ED Nurse Name and Phone #: Lenell Antu Name/Age/Gender James [name] 62 y.o. male Room/Bed: 034C/034C  Code Status   Code Status: Full Code  Home/SNF/Other Home Patient oriented to: self, place, time, and situation Is this baseline? Yes   Triage Complete: Triage complete  Chief Complaint COPD exacerbation (HCC) [J44.1]  Triage Note PT woke up at 6:30 with SOB.  No improvement at home.  PT has hx of COPD. Pt wears 2L at night at baseline.    EMS gave 7.5 albuterol, 125 Solumedrol  150/82 100% HR 104   18G L. AC   Allergies No Known Allergies  Level of Care/Admitting Diagnosis ED Disposition     ED Disposition  Admit   Condition  --   Comment  Hospital Area: [facility] [[id number]]  Level of Care: Telemetry Medical [104]  May place patient in observation at [facility] or [facility] if equivalent level of care is available:: No  Covid Evaluation: Asymptomatic - no recent exposure (last 10 days) testing not required  Diagnosis: COPD exacerbation [facility]) [[id number]]  Admitting Physician: Reymundo Poll [[id number]]  Attending Physician: Reymundo Poll [[id number]]          B Medical/Surgery History Past Medical History:  Diagnosis Date   Acute on chronic respiratory failure with hypoxia (HCC) 06/08/2013   Acute on chronic respiratory failure with hypoxia and hypercapnia (HCC) 06/08/2013   AKI (acute kidney injury) (HCC) 07/12/2022   Alcohol use disorder 11/23/2018   CAP (community acquired pneumonia) 05/17/2018   See admit 05/17/18 ? rml  with   covid pcr neg - rx augmentin > f/u cxr in 4-6 weeks is fine unless condition declines    Community acquired pneumonia 07/12/2021   Community acquired pneumonia of right lower lobe of lung 05/17/2018   See admit 05/17/18 ? 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   Recurrent pneumonia 02/20/2021   Tick bite 06/03/2018   Past Surgical History:  Procedure Laterality Date   ESOPHAGEAL MANOMETRY N/A 09/15/2022   Procedure: ESOPHAGEAL MANOMETRY (EM);  Surgeon: Hilarie Fredrickson, MD;  Location: [facility] ENDOSCOPY;  Service: Gastroenterology;  Laterality: N/A;   LEFT HEART CATH AND CORONARY ANGIOGRAPHY N/A 12/07/2021   Procedure: LEFT HEART CATH AND CORONARY ANGIOGRAPHY;  Surgeon: Runell Gess, MD;  Location: [facility] INVASIVE CV LAB;  Service: Cardiovascular;  Laterality: N/A;   LUNG REMOVAL, PARTIAL     VIDEO ASSISTED THORACOSCOPY (VATS)/THOROCOTOMY Left 06/11/2013   Procedure: VIDEO ASSISTED THORACOSCOPY (VATS)/THOROCOTOMY;  Surgeon: Kerin Perna, MD;  Location: [facility] OR;  Service: Thoracic;  Laterality: Left;   VIDEO BRONCHOSCOPY N/A 06/11/2013   Procedure: VIDEO BRONCHOSCOPY;  Surgeon: Kerin Perna, MD;  Location: [facility] OR;  Service: Thoracic;  Laterality: N/A;   VIDEO BRONCHOSCOPY N/A 08/10/2018   Procedure: VIDEO BRONCHOSCOPY WITH FLUORO;  Surgeon: Lorin Glass, MD;  Location: [facility] ENDOSCOPY;  Service: Endoscopy;  Laterality: N/A;     A IV Location/Drains/Wounds  Patient Lines/Drains/Airways Status     Active Line/Drains/Airways     Name Placement date Placement time Site Days   Peripheral IV 11/22/22 18 G Left Antecubital 11/22/22  0842  Antecubital  less than 1   Wound / Incision (Open or Dehisced) 12/02/21 Irritant Dermatitis (Moisture Associated Skin Damage) Groin Right;Left blotchy red 12/02/21  2000  Groin  355   Wound / Incision (Open or Dehisced) 12/02/21 Irritant Dermatitis (Moisture  Associated Skin Damage) Buttocks Right;Left blotchy redness 12/02/21  2000  Buttocks  355            Intake/Output Last 24 hours No intake or output data in the 24 hours ending 11/22/22 1200  Labs/Imaging Results for orders placed or performed during the hospital encounter of 11/22/22 (from the past 48 hour(s))  CBC with Differential     Status: Abnormal   Collection Time: 11/22/22  8:41 AM  Result Value Ref Range   WBC 15.6 (H) 4.0 - 10.5 K/uL   RBC 3.93 (L) 4.22 - 5.81 MIL/uL   Hemoglobin 10.9 (L) 13.0 - 17.0 g/dL   HCT 16.1 (L) 09.6 - 04.5 %   MCV 90.1 80.0 - 100.0 fL   MCH 27.7 26.0 - 34.0 pg   MCHC 30.8 30.0 - 36.0 g/dL   RDW 40.9 81.1 - 91.4 %   Platelets 280 150 - 400 K/uL   nRBC 0.0 0.0 - 0.2 %   Neutrophils Relative % 79 %   Neutro Abs 12.1 (H) 1.7 - 7.7 K/uL   Lymphocytes Relative 11 %   Lymphs Abs 1.7 0.7 - 4.0 K/uL   Monocytes Relative 9 %   Monocytes Absolute 1.5 (H) 0.1 - 1.0 K/uL   Eosinophils Relative 1 %   Eosinophils Absolute 0.2 0.0 - 0.5 K/uL   Basophils Relative 0 %   Basophils Absolute 0.1 0.0 - 0.1 K/uL   Immature Granulocytes 0 %   Abs Immature Granulocytes 0.06 0.00 - 0.07 K/uL    Comment: Performed at [facility], [address] [place], [place] [address]  Comprehensive metabolic panel     Status: Abnormal   Collection Time: 11/22/22  8:41 AM  Result Value Ref Range   Sodium 139 135 - 145 mmol/L   Potassium 4.2 3.5 - 5.1 mmol/L   Chloride 105 98 - 111 mmol/L   CO2 25 22 - 32 mmol/L   Glucose, Bld 130 (H) 70 - 99 mg/dL    Comment: Glucose reference range applies only to samples taken after fasting for at least 8 hours.   BUN 21 8 - 23 mg/dL   Creatinine, Ser 6.21 0.61 - 1.24 mg/dL   Calcium 9.2 8.9 - 30.8 mg/dL   Total Protein 5.9 (L) 6.5 - 8.1 g/dL   Albumin 3.4 (L) 3.5 - 5.0 g/dL   AST 26 15 - 41 U/L   ALT 30 0 - 44 U/L   Alkaline Phosphatase 42 38 - 126 U/L   Total Bilirubin 0.7 <1.2 mg/dL   GFR, Estimated >65 >78 mL/min     Comment: (NOTE) Calculated using the CKD-EPI Creatinine Equation (2021)    Anion gap 9 5 - 15    Comment: Performed at [facility], [address] [place], [place] [address]  Brain natriuretic peptide     Status: Abnormal   Collection Time: 11/22/22  8:41 AM  Result Value Ref Range   B Natriuretic Peptide 106.6 (H) 0.0 - 100.0 pg/mL    Comment: Performed  at [facility], [address], [place], [place] [address]  Troponin I (High Sensitivity)     Status: Abnormal   Collection Time: 11/22/22  8:41 AM  Result Value Ref Range   Troponin I (High Sensitivity) 19 (H) <18 ng/L    Comment: (NOTE) Elevated high sensitivity troponin I (hsTnI) values and significant  changes across serial measurements may suggest ACS but many other  chronic and acute conditions are known to elevate hsTnI results.  Refer to the "Links" section for chest pain algorithms and additional  guidance. Performed at [facility], [address], [place], [place] [address]    DG Chest Port 1 View  Result Date: 11/22/2022 CLINICAL DATA:  Shortness of breath. EXAM: PORTABLE CHEST 1 VIEW COMPARISON:  07/12/2022 and CT chest 02/22/2022. FINDINGS: Trachea is midline. Heart size stable. Thoracic aorta is calcified. Bullous emphysema. Superimposed mixed interstitial and airspace opacification in the mid and lower lung zones. Costophrenic angles were not included on the images. Numerous bilateral rib fractures. Old left clavicle fracture. IMPRESSION: 1. Mid and lower lung zone predominant mixed interstitial and airspace opacification, indicative of pneumonia. 2.  Emphysema (ICD10-J43.9). Electronically Signed   By: Leanna Battles M.D.   On: 11/22/2022 09:29    Pending Labs Unresulted Labs (From admission, onward)     Start     Ordered   11/23/22 0500  Basic metabolic panel  Tomorrow morning,   R        11/22/22 1111   11/23/22 0500  CBC  Tomorrow morning,   R        11/22/22 1111   11/22/22 1113  Blood  gas, venous  ONCE - STAT,   STAT        11/22/22 1112   11/22/22 1011  Blood culture (routine x 2)  BLOOD CULTURE X 2,   R (with STAT occurrences)      11/22/22 1010            Vitals/Pain Today's Vitals   11/22/22 1023 11/22/22 1030 11/22/22 1100 11/22/22 1130  BP: 125/61 (!) 108/57 125/68 127/61  Pulse: 94 (!) 108 92 96  Resp: (!) 25 (!) 23 (!) 28 (!) 23  Temp:      TempSrc:      SpO2: 90% 91% 92% 92%  Weight:      Height:      PainSc:        Isolation Precautions No active isolations  Medications Medications  colchicine tablet 1.2 mg (1.2 mg Oral Given 11/22/22 0901)    Followed by  colchicine tablet 0.6 mg (has no administration in time range)  azithromycin (ZITHROMAX) 500 mg in dextrose 5 % 250 mL IVPB (500 mg Intravenous New Bag/Given 11/22/22 1159)  rivaroxaban (XARELTO) tablet 10 mg (has no administration in time range)  ipratropium-albuterol (DUONEB) 0.5-2.5 (3) MG/3ML nebulizer solution 3 mL (has no administration in time range)  cefTRIAXone (ROCEPHIN) 2 g in sodium chloride 0.9 % 100 mL IVPB (has no administration in time range)  azithromycin (ZITHROMAX) tablet 500 mg (has no administration in time range)  insulin aspart (novoLOG) injection 0-15 Units (has no administration in time range)  atorvastatin (LIPITOR) tablet 10 mg (has no administration in time range)  pantoprazole (PROTONIX) EC tablet 40 mg (has no administration in time range)  predniSONE (DELTASONE) tablet 40 mg (has no administration in time range)  furosemide (LASIX) injection 40 mg (has no administration in time range)  ipratropium-albuterol (DUONEB) 0.5-2.5 (3) MG/3ML  nebulizer solution 3 mL (3 mLs Nebulization Given 11/22/22 0842)  magnesium sulfate IVPB 2 g 50 mL (0 g Intravenous Stopped 11/22/22 0902)  pregabalin (LYRICA) capsule 100 mg (100 mg Oral Given 11/22/22 0901)  cefTRIAXone (ROCEPHIN) 2 g in sodium chloride 0.9 % 100 mL IVPB (0 g Intravenous Stopped 11/22/22 1159)     Mobility walks     Focused Assessments     R Recommendations: See Admitting Provider Note  Report given to:   Additional Notes:

## 2022-11-22 NOTE — ED Triage Notes (Signed)
PT woke up at 6:30 with SOB.  No improvement at home.  PT has hx of COPD. Pt wears 2L at night at baseline.    EMS gave 7.5 albuterol, 125 Solumedrol  150/82 100% HR 104   18G L. AC

## 2022-11-22 NOTE — Plan of Care (Signed)

## 2022-11-23 ENCOUNTER — Other Ambulatory Visit (HOSPITAL_COMMUNITY): Payer: Self-pay

## 2022-11-23 ENCOUNTER — Observation Stay (HOSPITAL_BASED_OUTPATIENT_CLINIC_OR_DEPARTMENT_OTHER): Payer: Medicaid Other

## 2022-11-23 DIAGNOSIS — J9621 Acute and chronic respiratory failure with hypoxia: Secondary | ICD-10-CM | POA: Diagnosis not present

## 2022-11-23 DIAGNOSIS — R0602 Shortness of breath: Secondary | ICD-10-CM

## 2022-11-23 DIAGNOSIS — J441 Chronic obstructive pulmonary disease with (acute) exacerbation: Secondary | ICD-10-CM | POA: Diagnosis not present

## 2022-11-23 LAB — BLOOD CULTURE ID PANEL (REFLEXED) - BCID2

## 2022-11-23 LAB — ECHOCARDIOGRAM COMPLETE
AR max vel: 2.7 cm2
AV Area VTI: 2.39 cm2
AV Area mean vel: 2.42 cm2
AV Mean grad: 4 mm[Hg]
AV Peak grad: 6.8 mm[Hg]
Ao pk vel: 1.3 m/s
Area-P 1/2: 3.37 cm2
Height: 69 in
S' Lateral: 2.9 cm
Weight: 3200 [oz_av]

## 2022-11-23 LAB — IRON AND TIBC
Iron: 13 ug/dL — ABNORMAL LOW (ref 45–182)
Saturation Ratios: 3 % — ABNORMAL LOW (ref 17.9–39.5)
TIBC: 461 ug/dL — ABNORMAL HIGH (ref 250–450)
UIBC: 448 ug/dL

## 2022-11-23 LAB — FERRITIN: Ferritin: 11 ng/mL — ABNORMAL LOW (ref 24–336)

## 2022-11-23 LAB — BASIC METABOLIC PANEL
Anion gap: 11 (ref 5–15)
BUN: 27 mg/dL — ABNORMAL HIGH (ref 8–23)
CO2: 26 mmol/L (ref 22–32)
Calcium: 9.2 mg/dL (ref 8.9–10.3)
Chloride: 102 mmol/L (ref 98–111)
Creatinine, Ser: 1.04 mg/dL (ref 0.61–1.24)
GFR, Estimated: >60 mL/min (ref >60)
Glucose, Bld: 159 mg/dL — ABNORMAL HIGH (ref 70–99)
Potassium: 4.2 mmol/L (ref 3.5–5.1)
Sodium: 139 mmol/L (ref 135–145)

## 2022-11-23 LAB — CBC
HCT: 34.7 % — ABNORMAL LOW (ref 39.0–52.0)
Hemoglobin: 10.6 g/dL — ABNORMAL LOW (ref 13.0–17.0)
MCH: 26.2 pg (ref 26.0–34.0)
MCHC: 30.5 g/dL (ref 30.0–36.0)
MCV: 85.7 fL (ref 80.0–100.0)
Platelets: 275 10*3/uL (ref 150–400)
RBC: 4.05 MIL/uL — ABNORMAL LOW (ref 4.22–5.81)
RDW: 14.3 % (ref 11.5–15.5)
WBC: 12.9 10*3/uL — ABNORMAL HIGH (ref 4.0–10.5)
nRBC: 0 % (ref 0.0–0.2)

## 2022-11-23 LAB — GLUCOSE, CAPILLARY
Glucose-Capillary: 166 mg/dL — ABNORMAL HIGH (ref 70–99)
Glucose-Capillary: 155 mg/dL — ABNORMAL HIGH (ref 70–99)
Glucose-Capillary: 196 mg/dL — ABNORMAL HIGH (ref 70–99)

## 2022-11-23 LAB — STREP PNEUMONIAE URINARY ANTIGEN: Strep Pneumo Urinary Antigen: NEGATIVE

## 2022-11-23 MED ORDER — UMECLIDINIUM BROMIDE 62.5 MCG/ACT IN AEPB
1.0000 | INHALATION_SPRAY | Freq: Every day | RESPIRATORY_TRACT | Status: DC
  Filled 2022-11-23: qty 7

## 2022-11-23 MED ORDER — PREDNISONE 10 MG PO TABS
40.0000 mg | ORAL_TABLET | Freq: Every day | ORAL | 0 refills | Status: AC
  Filled 2022-11-23: qty 16, 4d supply, fill #0

## 2022-11-23 MED ORDER — FERROUS SULFATE 325 (65 FE) MG PO TABS
325.0000 mg | ORAL_TABLET | Freq: Every day | ORAL | 0 refills | Status: DC
  Filled 2022-11-23: qty 30, 30d supply, fill #0

## 2022-11-23 MED ORDER — IRON SUCROSE 500 MG IVPB - SIMPLE MED
500.0000 mg | Freq: Once | INTRAVENOUS | Status: DC
  Filled 2022-11-23: qty 275

## 2022-11-23 MED ORDER — PREGABALIN 100 MG PO CAPS
100.0000 mg | ORAL_CAPSULE | Freq: Every day | ORAL | Status: DC
  Administered 2022-11-23: 100 mg via ORAL
  Filled 2022-11-23: qty 1

## 2022-11-23 MED ORDER — AZITHROMYCIN 500 MG PO TABS
500.0000 mg | ORAL_TABLET | Freq: Every day | ORAL | 0 refills | Status: AC
  Filled 2022-11-23: qty 2, 2d supply, fill #0

## 2022-11-23 MED ORDER — MOMETASONE FURO-FORMOTEROL FUM 100-5 MCG/ACT IN AERO
2.0000 | INHALATION_SPRAY | Freq: Two times a day (BID) | RESPIRATORY_TRACT | Status: DC
  Filled 2022-11-23: qty 8.8

## 2022-11-23 MED ORDER — IRON SUCROSE 20 MG/ML IV SOLN
500.0000 mg | Freq: Once | INTRAVENOUS | Status: AC
  Administered 2022-11-23: 500 mg via INTRAVENOUS
  Filled 2022-11-23: qty 25

## 2022-11-23 NOTE — Inpatient Diabetes Management (Signed)
Inpatient Diabetes Program Recommendations  AACE/ADA: New Consensus Statement on Inpatient Glycemic Control (2015)  Target Ranges:  Prepandial:   less than 140 mg/dL      Peak postprandial:   less than 180 mg/dL (1-2 hours)      Critically ill patients:  140 - 180 mg/dL   Lab Results  Component Value Date   GLUCAP 166 (H) 11/23/2022   HGBA1C 6.2 (H) 06/14/2022    Review of Glycemic Control  Latest Reference Range & Units 11/22/22 11:43 11/22/22 16:01 11/22/22 20:26 11/22/22 23:07 11/23/22 07:41  Glucose-Capillary 70 - 99 mg/dL 161 (H) 096 (H) 045 (H) 143 (H) 166 (H)   Diabetes history: DM 2 Outpatient Diabetes medications: Glipizide 5 mg Daily Current orders for Inpatient glycemic control:  Novolog 0-15 units tid  PO prednisone 40 mg Daily  Inpatient Diabetes Program Recommendations:    Note: Glucose trends increase after prednisone dose. If steroid dose remains the same...  -   could consider adding Novolog 2 units tid meal coverage if eating>50% of meals  Thanks,  Christena Deem RN, MSN, BC-ADM Inpatient Diabetes Coordinator Team Pager [phone] (8a-5p)

## 2022-11-23 NOTE — Discharge Instructions (Addendum)
[name],  It was a pleasure taking care of you at [facility].  You were admitted for COPD exacerbation, and treated with steroids. We also gave you antibiotics for potential pneumonia. We are discharging you home now that you are doing better. Please follow the following instructions.   1) Regarding your COPD, please continue taking 40 mg of prednisone for the next 4 days.  After 40 mg have been finished, you can go back to your 10 mg of prednisone daily.  Continue using your inhalers.  Use nebulizers when you have further shortness of breath.  2) We have also discharged you on an antibiotic called azithromycin.  Please finish this course of antibiotics.  Do not miss any doses.  3) Please follow-up with your primary care physician in about 1 to 2 weeks.  Do not forget to schedule an appointment with them.  4) Other medication change we made is that we are starting you on an iron supplement as you have iron deficiency anemia.  Continue taking this iron supplement daily.  5) Given that you have end-stage COPD, I do recommend you get your pneumonia shot and your vaccine shot and asked your PCP if you need to be up-to-date on any other vaccines.  6) Please take all of your other medications as prescribed.  Take care,  Dr. Modena Slater, DO

## 2022-11-23 NOTE — Progress Notes (Signed)
HD#0 Subjective:   Summary: This is a 62 year old male with a past medical history of end stage COPD who presents with concerns of shortness of breath.  Patient admitted for COPD exacerbation.  Overnight Events: No acute events  Overnight patient evaluated bedside this morning.  He states he is doing well.  He states he is ready to go home.  He has no concerns this morning.  He states he has been up and walking with no concerns.  Objective:  Vital signs in last 24 hours: Vitals:   11/23/22 0438 11/23/22 0715 11/23/22 0743 11/23/22 1547  BP: 123/67  (!) 145/77 130/76  Pulse: 75  81 82  Resp: 17  18 19   Temp: 98.4 F (36.9 C)  97.7 F (36.5 C) 97.8 F (36.6 C)  TempSrc:   Oral Oral  SpO2: 98% 95% 100% 99%  Weight:      Height:       Supplemental O2: Nasal Cannula SpO2: 99 % O2 Flow Rate (L/min): 2 L/min   Physical Exam:  Constitutional: Resting in bed, no acute distress, nasal cannula in place Pulmonary/Chest: normal work of breathing on nasal cannula  Filed Weights   11/22/22 0831  Weight: 90.7 kg     Intake/Output Summary (Last 24 hours) at 11/23/2022 1822 Last data filed at 11/23/2022 1225 Gross per 24 hour  Intake 236 ml  Output --  Net 236 ml   Net IO Since Admission: 590 mL [11/23/22 1822]  Pertinent Labs:    Latest Ref Rng & Units 11/23/2022    6:43 AM 11/22/2022    8:41 AM 07/16/2022    6:12 AM  CBC  WBC 4.0 - 10.5 K/uL 12.9  15.6  14.3   Hemoglobin 13.0 - 17.0 g/dL 16.1  09.6  04.5   Hematocrit 39.0 - 52.0 % 34.7  35.4  37.1   Platelets 150 - 400 K/uL 275  280  299        Latest Ref Rng & Units 11/23/2022    6:43 AM 11/22/2022    8:41 AM 07/16/2022    6:12 AM  CMP  Glucose 70 - 99 mg/dL 409  811  99   BUN 8 - 23 mg/dL 27  21  29    Creatinine 0.61 - 1.24 mg/dL 9.14  7.82  9.56   Sodium 135 - 145 mmol/L 139  139  139   Potassium 3.5 - 5.1 mmol/L 4.2  4.2  4.6   Chloride 98 - 111 mmol/L 102  105  104   CO2 22 - 32 mmol/L 26  25  31     Calcium 8.9 - 10.3 mg/dL 9.2  9.2  8.6   Total Protein 6.5 - 8.1 g/dL  5.9    Total Bilirubin <1.2 mg/dL  0.7    Alkaline Phos 38 - 126 U/L  42    AST 15 - 41 U/L  26    ALT 0 - 44 U/L  30      Imaging: No results found.  Assessment/Plan:   Active Problems:   Acute on chronic respiratory failure (HCC)   COPD exacerbation (HCC)   Patient Summary: James [name] is a 62 y.o. with a pertinent PMH of end-stage COPD, type 2 diabetes presented to the emergency department with concerns of shortness of breath and admitted for COPD exacerbation.  #Acute on chronic hypoxemic respiratory failure #COPD exacerbation Patient evaluated bedside this morning.  He states he is doing much better.  He has just ambulated with the mobility specialist.  Pro-Cal came back negative.  Discontinue ceftriaxone today.  Patient did have blood cultures positive for Staph epidermidis in 1 of 4 bottles, likely contaminant.  Will finish course of azithromycin for anti-inflammatory effect. -Day 2 of 5 of prednisone -Day 2 of 3 of azithromycin -Continue on nasal cannula  #Concern for heart failure exacerbation Patient did have dose of IV Lasix yesterday.  No acute concerns this morning for volume overload. -Follow-up echo read  #Lower extremity pain No acute concerns today -Continue colchicine  #Diabetes mellitus No acute concerns today, blood sugars measuring well  -Continue sliding scale insulin  #GERD -Protonix  Of note.  Patient to discharge today.  Did drop this progress note just in case patient does not discharge.  There is some concern that patient cannot see at night and cannot drive at night and therefore wants to stay 1 more night in the hospital.  Tiring to see if I can arrange for [facility] to get patient taxi voucher to see if patient go tonight.  Diet: Normal IVF: None,None VTE: DOAC Code: Full PT/OT recs: None, none.  Dispo: Anticipated discharge to Home in 1 day pending ride from [facility].    Modena Slater DO Internal Medicine Resident PGY-2 [phone] Please contact the on call pager after 5 pm and on weekends at [phone].

## 2022-11-23 NOTE — Discharge Summary (Signed)
Name: James [name] MRN: [id number] DOB: [birth year] 62 y.o. PCP: [name], MD  Date of Admission: 11/22/2022  8:14 AM Date of Discharge: 11/23/2022 Attending Physician: Dr. Antony Contras  Discharge Diagnosis: Active Problems:   Acute on chronic respiratory failure (HCC)   COPD exacerbation (HCC)    Discharge Medications: Allergies as of 11/23/2022   No Known Allergies      Medication List     TAKE these medications    albuterol (2.5 MG/3ML) 0.083% nebulizer solution Commonly known as: PROVENTIL Inhale 3 mLs (2.5 mg total) by nebulization every 4 (four) hours as needed for wheezing or shortness of breath. What changed: when to take this   Ventolin HFA 108 (90 Base) MCG/ACT inhaler Generic drug: albuterol Inhale 2 puffs into the lungs every 6 (six) hours as needed for wheezing or shortness of breath. What changed: when to take this   atorvastatin 10 MG tablet Commonly known as: LIPITOR Take 1 tablet (10 mg total) by mouth daily.   azithromycin 500 MG tablet Commonly known as: ZITHROMAX Take 1 tablet (500 mg total) by mouth daily for 2 days. Start taking on: November 24, 2022   Breztri Aerosphere 160-9-4.8 MCG/ACT Aero Generic drug: Budeson-Glycopyrrol-Formoterol Inhale 2 puffs into the lungs in the morning and at bedtime.   FeroSul 325 (65 Fe) MG tablet Generic drug: ferrous sulfate Take 1 tablet (325 mg total) by mouth daily with breakfast.   folic acid 1 MG tablet Commonly known as: FOLVITE Take 1 tablet (1 mg total) by mouth daily.   furosemide 40 MG tablet Commonly known as: LASIX Take 1 tablet (40 mg total) by mouth daily.   glipiZIDE 5 MG 24 hr tablet Commonly known as: GLUCOTROL XL Take 1 tablet (5 mg total) by mouth daily with breakfast.   losartan 100 MG tablet Commonly known as: COZAAR Take 1 tablet (100 mg total) by mouth daily.   metoprolol succinate 50 MG 24 hr tablet Commonly known as: TOPROL-XL Take 1 tablet (50 mg total) by  mouth daily. Take with or immediately following a meal.   nystatin cream Commonly known as: MYCOSTATIN Apply to affected areas 3 (three) times daily. What changed:  how much to take when to take this reasons to take this   pantoprazole 40 MG tablet Commonly known as: PROTONIX Take 1 tablet (40 mg total) by mouth daily.   predniSONE 10 MG tablet Commonly known as: DELTASONE Take 1 tablet (10 mg total) by mouth daily with breakfast. What changed: Another medication with the same name was added. Make sure you understand how and when to take each.   predniSONE 10 MG tablet Commonly known as: DELTASONE Take 4 tablets (40 mg total) by mouth daily with breakfast for 4 doses. What changed: You were already taking a medication with the same name, and this prescription was added. Make sure you understand how and when to take each.   pregabalin 100 MG capsule Commonly known as: Lyrica Take 1 capsule (100 mg total) by mouth 2 (two) times daily. What changed:  how much to take when to take this        Disposition and follow-up:   [name] was discharged from [facility] in Good condition.  At the hospital follow up visit please address:  1.  Follow-up:  a.  COPD exacerbation: Patient discharged on azithromycin as well as prednisone.  Patient ambulated well during hospitalization.  Continue to manage COPD outpatient.  Patient already on triple  therapy.  Given end-stage COPD, ensure patient is up-to-date on all vaccines such as pneumonia as well as flu.   b.  Iron deficiency anemia: Patient had iron studies during hospitalization which showed evidence of iron deficiency anemia.  Had 1 dose of 500 mg of iron sucrose, and transitioned to ferrous sulfate 325 mg daily.  Recheck CBC and iron panel in 6 months  2.  Labs / imaging needed at time of follow-up: BMP, iron panel, CBC  3.  Pending labs/ test needing follow-up: ECHO results   4.  Medication Changes  1)  Patient discharged on 4 days of prednisone 40 mg daily, then can go back to 10 mg daily  2) Patient discharged on 2 more doses of azithromycin  3) Patient started on ferrous sulfate 325 mg daily.  Follow-up Appointments:  Follow-up Information     [name], MD. Call today.   Specialty: Pulmonary Disease Why: Please call to make appointment with your primary care physician in the next week or 2 for hospital follow-up. Contact information: [address] [place] [address] [phone]                 Hospital Course by problem list: This is a 62 year old male with a past medical history of end-stage COPD, type 2 diabetes mellitus who presented to the emergency department with concerns of shortness of breath and admitted for COPD exacerbation.  #Acute on chronic hypoxemic respiratory failure #COPD exacerbation Patient presented with dyspnea and nonproductive cough.  Patient had workup during hospitalization with x-ray revealing concern for pneumonia.  Patient did get started on broad-spectrum antibiotics.  During hospitalization, patient did also have blood cultures that grew Staph epidermidis, but likely was contaminant.  Procalcitonin was obtained and came back negative.  Patient could likely have had a viral pneumonia causing COPD exacerbation.  There was some concern about volume overload, and patient was diuresed with IV Lasix.  Patient progressed well during hospitalization.  Ceftriaxone was stopped given negative procalcitonin.  Patient continued azithromycin for anti-inflammatory effect.  Patient discharged on prednisone as well as azithromycin.  Patient encouraged to continue using inhalers outpatient.  #Heart failure exacerbation Likely secondary to underlying COPD.  Patient had IV Lasix which improved the patient's oxygenation well.  Patient ambulated during hospitalization and did well.  Echo was obtained during the hospitalization. patient continued  on Lasix outpatient.  No acute concerns at discharge.  #Iron deficiency anemia During hospitalization, patient had iron studies consistent with iron deficiency anemia.  Had 1 dose of iron sucrose during hospitalization and discharged with oral supplementation.  Patient did not require any blood transfusions.  #Lower extremity pain Patient continued colchicine during hospitalization.  No acute concerns at discharge.  #Diabetes mellitus During hospitalization, blood sugars remained well measured.  Patient had sliding scale insulin during hospitalization.  Held glipizide.  On discharge, resume glipizide.  #GERD Continue home PPI during hospitalization.  No acute concerns during hospitalization.  Discharge Subjective:  Patient evaluated at bedside this morning.  He states he is doing well.  He had just walked with the mobility specialist and he said that he is feeling great.  He states he is ready to go home.  He denies any shortness of breath, chest pain, or any other concerns.  He denies any fevers or chills.  Discharge Exam:   BP 130/76 (BP Location: Right Arm)   Pulse 82   Temp 97.8 F (36.6 C) (Oral)   Resp 19  Ht 5\' 9"  (1.753 m)   Wt 90.7 kg   SpO2 99%   BMI 29.53 kg/m   Constitutional: Not in acute distress.  Cardiovascular: regular rate and rhythm, no m/r/g Pulmonary/Chest: normal work of breathing , mild bilateral wheezes.  Abdominal: soft, non-tender, non-distended Skin: warm and dry Psych: Mood and affect appropriate.   Pertinent Labs, Studies, and Procedures:     Latest Ref Rng & Units 11/23/2022    6:43 AM 11/22/2022    8:41 AM 07/16/2022    6:12 AM  CBC  WBC 4.0 - 10.5 K/uL 12.9  15.6  14.3   Hemoglobin 13.0 - 17.0 g/dL 95.6  21.3  08.6   Hematocrit 39.0 - 52.0 % 34.7  35.4  37.1   Platelets 150 - 400 K/uL 275  280  299        Latest Ref Rng & Units 11/23/2022    6:43 AM 11/22/2022    8:41 AM 07/16/2022    6:12 AM  CMP  Glucose 70 - 99 mg/dL 578  469   99   BUN 8 - 23 mg/dL 27  21  29    Creatinine 0.61 - 1.24 mg/dL 6.29  5.28  4.13   Sodium 135 - 145 mmol/L 139  139  139   Potassium 3.5 - 5.1 mmol/L 4.2  4.2  4.6   Chloride 98 - 111 mmol/L 102  105  104   CO2 22 - 32 mmol/L 26  25  31    Calcium 8.9 - 10.3 mg/dL 9.2  9.2  8.6   Total Protein 6.5 - 8.1 g/dL  5.9    Total Bilirubin <1.2 mg/dL  0.7    Alkaline Phos 38 - 126 U/L  42    AST 15 - 41 U/L  26    ALT 0 - 44 U/L  30      DG Chest Port 1 View  Result Date: 11/22/2022 CLINICAL DATA:  Shortness of breath. EXAM: PORTABLE CHEST 1 VIEW COMPARISON:  07/12/2022 and CT chest 02/22/2022. FINDINGS: Trachea is midline. Heart size stable. Thoracic aorta is calcified. Bullous emphysema. Superimposed mixed interstitial and airspace opacification in the mid and lower lung zones. Costophrenic angles were not included on the images. Numerous bilateral rib fractures. Old left clavicle fracture. IMPRESSION: 1. Mid and lower lung zone predominant mixed interstitial and airspace opacification, indicative of pneumonia. 2.  Emphysema (ICD10-J43.9). Electronically Signed   By: Leanna Battles M.D.   On: 11/22/2022 09:29     Discharge Instructions: Discharge Instructions     Call MD for:  persistant dizziness or light-headedness   Complete by: As directed    Call MD for:  persistant nausea and vomiting   Complete by: As directed    Call MD for:  redness, tenderness, or signs of infection (pain, swelling, redness, odor or green/yellow discharge around incision site)   Complete by: As directed    Call MD for:  severe uncontrolled pain   Complete by: As directed    Diet - low sodium heart healthy   Complete by: As directed    Discharge instructions   Complete by: As directed    [name],  It was a pleasure taking care of you at [facility].  You were admitted for COPD exacerbation, and treated with steroids. We also gave you antibiotics for potential pneumonia. We are discharging you  home now that you are doing better. Please follow the following instructions.   1) Regarding your  COPD, please continue taking 40 mg of prednisone for the next 4 days.  After 40 mg have been finished, you can go back to your 10 mg of prednisone daily.  Continue using your inhalers.  Use nebulizers when you have further shortness of breath.  2) We have also discharged you on an antibiotic called azithromycin.  Please finish this course of antibiotics.  Do not miss any doses.  3) Please follow-up with your primary care physician in about 1 to 2 weeks.  Do not forget to schedule an appointment with them.  4) Other medication change we made is that we are starting you on an iron supplement as you have iron deficiency anemia.  Continue taking this iron supplement daily.  5) Given that you have end-stage COPD, I do recommend you get your pneumonia shot and your vaccine shot and asked your PCP if you need to be up-to-date on any other vaccines.  6) Please take all of your other medications as prescribed.  Take care,  Dr. Modena Slater, DO   Increase activity slowly   Complete by: As directed        Signed: Modena Slater, DO 11/23/2022, 4:45 PM   Pager: [phone]

## 2022-11-23 NOTE — Plan of Care (Signed)

## 2022-11-23 NOTE — Progress Notes (Signed)
Mobility Specialist Progress Note:   11/23/22 1015  Mobility  Activity Ambulated with assistance in hallway  Level of Assistance Minimal assist, patient does 75% or more  Assistive Device None  Distance Ambulated (ft) 300 ft  Activity Response Tolerated well  Mobility Referral Yes  $Mobility charge 1 Mobility  Mobility Specialist Start Time (ACUTE ONLY) 1015  Mobility Specialist Stop Time (ACUTE ONLY) 1030  Mobility Specialist Time Calculation (min) (ACUTE ONLY) 15 min   During Mobility: SpO2 98% on 2LO2  Pt eager for mobility session. Required up to minA during ambulation d/t LOB. Pt denies SOB, c/o L foot pain described as sharp/stabbing. Pt back in bed with all needs met, alarm on.    Addison Lank Mobility Specialist Please contact via SecureChat or  Rehab office at [phone]

## 2022-11-23 NOTE — Plan of Care (Signed)
  Problem: Metabolic: Goal: Ability to maintain appropriate glucose levels will improve Outcome: Progressing   Problem: Nutritional: Goal: Maintenance of adequate nutrition will improve Outcome: Progressing   Problem: Activity: Goal: Risk for activity intolerance will decrease Outcome: Progressing

## 2022-11-23 NOTE — Progress Notes (Signed)
PHARMACY - PHYSICIAN COMMUNICATION CRITICAL VALUE ALERT - BLOOD CULTURE IDENTIFICATION (BCID)  James [name] is an 62 y.o. male who presented to [facility] on 11/22/2022 with a chief complaint of shortness of breath. Chest X Ray showed pneumonia and he is also being treated for a possible COPD exacerbation.  Assessment:  Likely contaminant. Staph epi with mecA resistance grew in 1 out of 4 bottles. Given the patients' stable clinical picture, WBC down trending and no fevers, believed this is a contaminant. Will continue to monitor culture results and clinical picture.    Name of physician (or Provider) Contacted: Internal Med Teaching Service  Current antibiotics: azithromycin 500 mg daily and ceftriaxone 2g daily (day 2 of both)  Changes to prescribed antibiotics recommended:  Patient is on recommended antibiotics - No changes needed  Results for orders placed or performed during the hospital encounter of 11/22/22  Blood Culture ID Panel (Reflexed) (Collected: 11/22/2022 10:49 AM)  Result Value Ref Range   Enterococcus faecalis NOT DETECTED NOT DETECTED   Enterococcus Faecium NOT DETECTED NOT DETECTED   Listeria monocytogenes NOT DETECTED NOT DETECTED   Staphylococcus species DETECTED (A) NOT DETECTED   Staphylococcus aureus (BCID) NOT DETECTED NOT DETECTED   Staphylococcus epidermidis DETECTED (A) NOT DETECTED   Staphylococcus lugdunensis NOT DETECTED NOT DETECTED   Streptococcus species NOT DETECTED NOT DETECTED   Streptococcus agalactiae NOT DETECTED NOT DETECTED   Streptococcus pneumoniae NOT DETECTED NOT DETECTED   Streptococcus pyogenes NOT DETECTED NOT DETECTED   A.calcoaceticus-baumannii NOT DETECTED NOT DETECTED   Bacteroides fragilis NOT DETECTED NOT DETECTED   Enterobacterales NOT DETECTED NOT DETECTED   Enterobacter cloacae complex NOT DETECTED NOT DETECTED   Escherichia coli NOT DETECTED NOT DETECTED   Klebsiella aerogenes NOT DETECTED NOT DETECTED   Klebsiella  oxytoca NOT DETECTED NOT DETECTED   Klebsiella pneumoniae NOT DETECTED NOT DETECTED   Proteus species NOT DETECTED NOT DETECTED   Salmonella species NOT DETECTED NOT DETECTED   Serratia marcescens NOT DETECTED NOT DETECTED   Haemophilus influenzae NOT DETECTED NOT DETECTED   Neisseria meningitidis NOT DETECTED NOT DETECTED   Pseudomonas aeruginosa NOT DETECTED NOT DETECTED   Stenotrophomonas maltophilia NOT DETECTED NOT DETECTED   Candida albicans NOT DETECTED NOT DETECTED   Candida auris NOT DETECTED NOT DETECTED   Candida glabrata NOT DETECTED NOT DETECTED   Candida krusei NOT DETECTED NOT DETECTED   Candida parapsilosis NOT DETECTED NOT DETECTED   Candida tropicalis NOT DETECTED NOT DETECTED   Cryptococcus neoformans/gattii NOT DETECTED NOT DETECTED   Methicillin resistance mecA/C DETECTED (A) NOT DETECTED    Have a great day everyone!   Mack Guise 11/23/2022  8:28 AM

## 2022-11-24 ENCOUNTER — Other Ambulatory Visit: Payer: Self-pay | Admitting: *Deleted

## 2022-11-24 NOTE — Patient Outreach (Signed)
11/24/2022  James [name] [id number] 04-Jun-1960  Transition Care Management Follow-up Telephone Call Date of discharge and from where: 11/23/22 from [facility] How have you been since you were released from the hospital? better Any questions or concerns? No  Items Reviewed: Did the pt receive and understand the discharge instructions provided? Yes  Medications obtained and verified? Yes  Other? Yes  Any new allergies since your discharge? No  Dietary orders reviewed? Yes Do you have support at home? No   Home Care and Equipment/Supplies: Were home health services ordered? no If so, what is the name of the agency? N/A  Has the agency set up a time to come to the patient's home? not applicable Were any new equipment or medical supplies ordered?  No What is the name of the medical supply agency? N/A Were you able to get the supplies/equipment? not applicable Do you have any questions related to the use of the equipment or supplies? No  Functional Questionnaire: (I = Independent and D = Dependent) ADLs: I   Bathing/Dressing- I  Meal Prep- I  Eating- I  Maintaining continence- I  Transferring/Ambulation- I  Managing Meds- I  Follow up appointments reviewed:  PCP Hospital f/u appt confirmed? No RNCM assisted with scheduling Hospital follow up  Scheduled to see Dr. Alvis Lemmings on 12/08/22 @ 2:10pm. Specialist Hospital f/u appt confirmed? No   Are transportation arrangements needed? Yes  RNCM assisted with scheduling transportation for 12/01/22 with Dr. Ralene Cork and 12/08/22 with Dr. Alvis Lemmings If their condition worsens, is the pt aware to call PCP or go to the Emergency Dept.? Yes Was the patient provided with contact information for the PCP's office or ED? No Was to pt encouraged to call back with questions or concerns? Yes  SDOH assessments and interventions completed:   Yes Care Coordination     [name] RN, BSN [place]  [facility]  Health RN Care Coordinator [phone]

## 2022-11-24 NOTE — Patient Instructions (Addendum)
Visit Information  James [name] was given information about Medicaid Managed Care team care coordination services as a part of their Healthy [facility] Medicaid benefit. James [name] verbally consented to engagement with the [facility] Managed Care team.   If you are experiencing a medical emergency, please call 911 or report to your local emergency department or urgent care.   If you have a non-emergency medical problem during routine business hours, please contact your provider's office and ask to speak with a nurse.   For questions related to your Healthy [facility] health plan, please call: [phone] or visit the homepage here: [email or web address]  If you would like to schedule transportation through your Healthy [facility] plan, please call the following number at least 2 days in advance of your appointment: [phone]  For information about your ride after you set it up, call Ride Assist at [phone]. Use this number to activate a Will Call pickup, or if your transportation is late for a scheduled pickup. Use this number, too, if you need to make a change or cancel a previously scheduled reservation.  If you need transportation services right away, call [phone]. The after-hours call center is staffed 24 hours to handle ride assistance and urgent reservation requests (including discharges) 365 days a year. Urgent trips include sick visits, hospital discharge requests and life-sustaining treatment.  Call the [facility] Line at [phone], at any time, 24 hours a day, 7 days a week. If you are in danger or need immediate medical attention call 911.  If you would like help to quit smoking, call 1-800-QUIT-NOW (845-149-3058) OR Espaol: 1-855-Djelo-Ya (3-762-831-5176) o para ms informacin haga clic aqu or Text READY to 160-737 to register via text  James [name] - following are the goals we discussed in your visit today:   Goals  Addressed   None     Please see education materials related to iron rich foods provided as print materials.   The patient verbalized understanding of instructions, educational materials, and care plan provided today and agreed to receive a mailed copy of patient instructions, educational materials, and care plan.   Telephone follow up appointment with Managed Medicaid care management team member scheduled for:12/23/22 at 11:15am  [name] RN, BSN [place]  Value-Based Care [facility] RN Care Coordinator [phone]   Following is a copy of your plan of care:  Care Plan : RN Care Manager Plan of Care  Updates made by [name], RN since 11/24/2022 12:00 AM     Problem: Chronic Disease Management needs in patient with COPD   Priority: High     Long-Range Goal: Plan of care established for for Chronic Disease management for patient with COPD   Start Date: 11/13/2020  Expected End Date: 12/03/2022  Priority: High  Note:   Current Barriers:  Care Coordination needs related to Limited access to food  Chronic Disease Management support and education needs related to COPD    RNCM Clinical Goal(s):  Patient will verbalize understanding of plan for management of COPD as evidenced by taking all medications as prescribed, attending all scheduled appointments and contacting provider with questions and concerns take all medications exactly as prescribed and will call provider for medication related questions as evidenced by refilling medications and taking as prescribed    attend all scheduled medical appointments: [facility] on 12/01/22 as evidenced by provider notes in EMR        demonstrate improved adherence to prescribed treatment plan for COPD as  evidenced by taking all medications as prescribed, attending all scheduled appointments and contacting provider with questions and concerns continue to work with RN Care Manager and/or Social Worker to address care  management and care coordination needs related to COPD as evidenced by adherence to CM Team Scheduled appointments     through collaboration with Medical illustrator, provider, and care team.   Interventions: Inter-disciplinary care team collaboration (see longitudinal plan of care) Evaluation of current treatment plan related to  self management and patient's adherence to plan as established by provider  Provided patient with education on iron rich foods   COPD: (Status: Goal on Track (progressing): YES.) Long Term Goal  Reviewed medications with patient, assisted with requesting needed refills Advised patient to self assesses COPD action plan zone and make appointment with provider if in the yellow zone for 48 hours without improvement Provided education about and advised patient to utilize infection prevention strategies to reduce risk of respiratory infection Discussed the importance of adequate rest and management of fatigue with COPD Discussed triggers and avoiding being around fires and fumes  Discussed the importance of handwashing and wearing a mask in public, explained the importance of infection prevention Advised patient to schedule follow up with Pulmonology/Dr. Tonia Brooms [phone] Reviewed discharge instructions and discussed in detail with patient Assisted with scheduling Hospital follow up visit-scheduled on 12/08/22 Assisted with scheduling transportation for appointment on 12/01/22 with [facility], pick up from home at 11:05 am and on 12/08/22 for Hospital follow up, pick up from home at 1:10pm-Patient will call for return ride home  Patient Goals/Self-Care Activities: Take medications as prescribed   Attend all scheduled provider appointments Call pharmacy for medication refills 3-7 days in advance of running out of medications Perform all self care activities independently  Call provider office for new concerns or questions  - avoid second hand smoke - limit outdoor activity during  cold weather - keep follow-up appointments: 12/01/22 with Dr. Ralene Cork and Hospital Follow up on 12/08/22 - eat healthy/prescribed diet: DASH - get at least 7 to 8 hours of sleep at night

## 2022-11-25 LAB — CULTURE, BLOOD (ROUTINE X 2): Special Requests: ADEQUATE

## 2022-11-27 LAB — CULTURE, BLOOD (ROUTINE X 2)
Culture: NO GROWTH
Special Requests: ADEQUATE

## 2022-12-01 ENCOUNTER — Other Ambulatory Visit: Payer: Self-pay

## 2022-12-01 ENCOUNTER — Other Ambulatory Visit (HOSPITAL_COMMUNITY): Payer: Self-pay

## 2022-12-01 ENCOUNTER — Ambulatory Visit: Payer: Medicaid Other | Admitting: Podiatry

## 2022-12-01 DIAGNOSIS — Z91199 Patient's noncompliance with other medical treatment and regimen due to unspecified reason: Secondary | ICD-10-CM

## 2022-12-01 NOTE — Progress Notes (Signed)
No show

## 2022-12-05 DIAGNOSIS — F79 Unspecified intellectual disabilities: Secondary | ICD-10-CM | POA: Diagnosis not present

## 2022-12-05 DIAGNOSIS — J9601 Acute respiratory failure with hypoxia: Secondary | ICD-10-CM | POA: Diagnosis not present

## 2022-12-05 DIAGNOSIS — J9621 Acute and chronic respiratory failure with hypoxia: Secondary | ICD-10-CM | POA: Diagnosis not present

## 2022-12-06 ENCOUNTER — Other Ambulatory Visit: Payer: Self-pay

## 2022-12-07 DIAGNOSIS — F79 Unspecified intellectual disabilities: Secondary | ICD-10-CM | POA: Diagnosis not present

## 2022-12-07 DIAGNOSIS — J9601 Acute respiratory failure with hypoxia: Secondary | ICD-10-CM | POA: Diagnosis not present

## 2022-12-07 DIAGNOSIS — J9621 Acute and chronic respiratory failure with hypoxia: Secondary | ICD-10-CM | POA: Diagnosis not present

## 2022-12-08 ENCOUNTER — Inpatient Hospital Stay: Payer: Medicaid Other | Admitting: Family Medicine

## 2022-12-23 ENCOUNTER — Other Ambulatory Visit: Payer: Self-pay | Admitting: *Deleted

## 2022-12-23 NOTE — Patient Outreach (Signed)
Medicaid Managed Care   Nurse Care Manager Note  12/23/2022 Name:  James [name] MRN:  [id number] DOB:  [birth year]  James [name] is an 62 y.o. year old male who is a primary patient of James Field Charlcie Cradle, MD.  The [facility] Managed Care Coordination team was consulted for assistance with:    COPD  [name] was given information about Medicaid Managed Care Coordination team services today. James [name] Patient agreed to services and verbal consent obtained.  Engaged with patient by telephone for follow up visit in response to provider referral for case management and/or care coordination services.   Patient is participating in a Managed Medicaid Plan:  Yes  Assessments/Interventions:  Review of past medical history, allergies, medications, health status, including review of consultants reports, laboratory and other test data, was performed as part of comprehensive evaluation and provision of chronic care management services.  SDOH (Social Drivers of Health) assessments and interventions performed: SDOH Interventions    Flowsheet Row Patient Outreach Telephone from 12/23/2022 in [place] POPULATION HEALTH DEPARTMENT Patient Outreach Telephone from 10/13/2022 in [place] POPULATION HEALTH DEPARTMENT Telephone from 07/16/2022 in [place] POPULATION HEALTH DEPARTMENT Patient Outreach Telephone from 06/30/2022 in [place] POPULATION HEALTH DEPARTMENT Patient Outreach Telephone from 05/25/2022 in [place] POPULATION HEALTH DEPARTMENT Patient Outreach Telephone from 04/19/2022 in  POPULATION HEALTH DEPARTMENT  SDOH Interventions        Food Insecurity Interventions -- -- -- -- -- Intervention Not Indicated  Housing Interventions -- -- -- -- Intervention Not Indicated --  Transportation Interventions --  [Assisted patient with arranging transportation to upcoming appointment] Payor Benefit Payor Benefit Payor Benefit  [assisted with arranging transportation] Payor Benefit   [Assisted with arranging transportation to upcoming appointments] --  Utilities Interventions -- -- -- -- -- Intervention Not Indicated       Care Plan  No Known Allergies  Medications Reviewed Today     Reviewed by [name], RN (Registered Nurse) on 12/23/22 at 1149  Med List Status: <None>   Medication Order Taking? Sig Documenting Provider Last Dose Status Informant  albuterol (PROVENTIL) (2.5 MG/3ML) 0.083% nebulizer solution [id number] Yes Inhale 3 mLs (2.5 mg total) by nebulization every 4 (four) hours as needed for wheezing or shortness of breath.  Patient taking differently: Take 2.5 mg by nebulization at bedtime.   James Frisk, MD Taking Active Self, Pharmacy Records  albuterol (VENTOLIN HFA) 108 [phone] Base) MCG/ACT inhaler [id number] Yes Inhale 2 puffs into the lungs every 6 (six) hours as needed for wheezing or shortness of breath.  Patient taking differently: Inhale 2 puffs into the lungs in the morning and at bedtime.   James Frisk, MD Taking Active Self, Pharmacy Records  atorvastatin (LIPITOR) 10 MG tablet [id number] Yes Take 1 tablet (10 mg total) by mouth daily. James Frisk, MD Taking Active Self, Pharmacy Records  Budeson-Glycopyrrol-Formoterol [facility] AEROSPHERE) 160-9-4.8 MCG/ACT James [name] [id number] Yes Inhale 2 puffs into the lungs in the morning and at bedtime. James Frisk, MD Taking Active Self, Pharmacy Records           Med Note [name], Helen Newberry Joy [name] A   Tue Nov 02, 2022 10:18 AM)    ferrous sulfate 325 (65 FE) MG tablet [id number] Yes Take 1 tablet (325 mg total) by mouth daily with breakfast. James Slater, DO Taking Active   folic acid (FOLVITE) 1 MG tablet [id number] Yes Take 1 tablet (1 mg total) by mouth daily. James Frisk,  MD Taking Active Self, Pharmacy Records           Med Note [name], James [name] A   Thu Dec 23, 2022 11:47 AM)    furosemide (LASIX) 40 MG tablet [id number] Yes Take 1 tablet (40 mg total) by mouth daily. James Frisk, MD  Taking Active Self, Pharmacy Records  glipiZIDE (GLUCOTROL XL) 5 MG 24 hr tablet [id number] Yes Take 1 tablet (5 mg total) by mouth daily with breakfast. James Frisk, MD Taking Active Self, Pharmacy Records  losartan (COZAAR) 100 MG tablet [id number] Yes Take 1 tablet (100 mg total) by mouth daily. James Frisk, MD Taking Active Self, Pharmacy Records  metoprolol succinate (TOPROL-XL) 50 MG 24 hr tablet [id number] Yes Take 1 tablet (50 mg total) by mouth daily. Take with or immediately following a meal. James Frisk, MD Taking Active Self, Pharmacy Records           Med Note [name], James [name] A   Thu Dec 23, 2022 11:47 AM)    Multiple Vitamin (MULTIVITAMIN WITH MINERALS) TABS tablet [id number] No Take 1 tablet by mouth daily.  Patient not taking: Reported on 12/23/2022   James Chapel, MD Not Taking Active   nystatin cream (MYCOSTATIN) [id number] Yes Apply to affected areas 3 (three) times daily.  Patient taking differently: Apply 1 Application topically as needed (irritation).   James Frisk, MD Taking Active Self, Pharmacy Records  pantoprazole (PROTONIX) 40 MG tablet [id number] Yes Take 1 tablet (40 mg total) by mouth daily. James Frisk, MD Taking Active Self, Pharmacy Records           Med Note [name], James [name] A   Thu Dec 23, 2022 11:48 AM)    predniSONE (DELTASONE) 10 MG tablet [id number] Yes Take 1 tablet (10 mg total) by mouth daily with breakfast. James Frisk, MD Taking Active Self, Pharmacy Records  pregabalin (LYRICA) 100 MG capsule [id number] Yes Take 1 capsule (100 mg total) by mouth 2 (two) times daily.  Patient taking differently: Take 200 mg by mouth daily.   Hoy Register, MD Taking Active Self, Pharmacy Records           Med Note [name], James [name] A   Wed Nov 24, 2022 10:42 AM) Taking once daily, every AM            Patient Active Problem List   Diagnosis Date Noted   COPD exacerbation (HCC) 11/22/2022   Dysphagia 10/01/2022   Alcohol  dependence in remission (HCC) 03/02/2022   Aortic root dilation (HCC) 03/02/2022   Vision changes 12/29/2021   Oropharyngeal dysphagia 12/29/2021   Onychomycosis 12/29/2021   Malnutrition of moderate degree 12/05/2021   Tinea cruris 07/21/2021   Multiple lung nodules on CT 06/11/2021   COPD with chronic bronchitis (HCC) 02/01/2021   GERD (gastroesophageal reflux disease) 02/01/2021   Neuropathy 09/29/2020   Bronchiectasis (HCC) 09/24/2019   Type 2 diabetes mellitus (HCC) 07/24/2019   Other atopic dermatitis 05/01/2019   Acute on chronic respiratory failure (HCC) 04/23/2019   Intellectual disability 04/23/2019   HTN (hypertension) 05/17/2016   Former tobacco use 04/06/2016   COPD mixed type (HCC) 03/26/2016    Conditions to be addressed/monitored per PCP order:  COPD  Care Plan : RN Care Manager Plan of Care  Updates made by [name], RN since 12/23/2022 12:00 AM     Problem: Chronic Disease Management needs in patient with COPD   Priority: High     Long-Range Goal: Plan  of care established for for Chronic Disease management for patient with COPD   Start Date: 11/13/2020  Expected End Date: 12/03/2022  Priority: High  Note:   Current Barriers:  Care Coordination needs related to Limited access to food  Chronic Disease Management support and education needs related to COPD    RNCM Clinical Goal(s):  Patient will verbalize understanding of plan for management of COPD as evidenced by taking all medications as prescribed, attending all scheduled appointments and contacting provider with questions and concerns take all medications exactly as prescribed and will call provider for medication related questions as evidenced by refilling medications and taking as prescribed    attend all scheduled medical appointments: [facility] on 12/01/22 as evidenced by provider notes in EMR        demonstrate improved adherence to prescribed treatment plan for COPD as evidenced by taking all  medications as prescribed, attending all scheduled appointments and contacting provider with questions and concerns continue to work with RN Care Manager and/or Social Worker to address care management and care coordination needs related to COPD as evidenced by adherence to CM Team Scheduled appointments     through collaboration with Medical illustrator, provider, and care team.   Interventions: Inter-disciplinary care team collaboration (see longitudinal plan of care) Evaluation of current treatment plan related to  self management and patient's adherence to plan as established by provider  Provided patient with education on iron rich foods Assisted patient with rescheduling missed appointment with [facility] Provided a list of provider contact information and address to each office Discussed the importance of patient learning to make and reschedule appointments and arrange transportation independently Provided encouragement and guidance   COPD: (Status: Goal on Track (progressing): YES.) Long Term Goal  Reviewed medications with patient, assisted with requesting needed refills Advised patient to self assesses COPD action plan zone and make appointment with provider if in the yellow zone for 48 hours without improvement Provided education about and advised patient to utilize infection prevention strategies to reduce risk of respiratory infection Discussed the importance of adequate rest and management of fatigue with COPD Discussed triggers and avoiding being around fires and fumes  Discussed the importance of handwashing and wearing a mask in public, explained the importance of infection prevention Assisted with scheduling transportation for appointment on 12/28/22 with PCP pick up from home at 8:15-8:45am, return ride home at 11am Advised patient to work quitting drinking alcohol  Patient Goals/Self-Care Activities: Take medications as prescribed   Attend all scheduled provider  appointments Call pharmacy for medication refills 3-7 days in advance of running out of medications Perform all self care activities independently  Call provider office for new concerns or questions  - avoid second hand smoke - limit outdoor activity during cold weather - keep follow-up appointments: 12/01/22 with Dr. Ralene Cork and [name] Follow up on 12/08/22 - eat healthy/prescribed diet: DASH - get at least 7 to 8 hours of sleep at night       Follow Up:  Patient agrees to Care Plan and Follow-up.  Plan: The Managed Medicaid care management team will reach out to the patient again over the next 30 days.  Date/time of next scheduled RN care management/care coordination outreach:  01/19/23 at 11:15am  [name] RN, BSN [place]  Value-Based Care Institute [facility] RN Care Coordinator [phone]

## 2022-12-23 NOTE — Patient Instructions (Signed)
Visit Information  James [name] was given information about Medicaid Managed Care team care coordination services as a part of their Healthy [employer] Medicaid benefit. James [name] verbally consented to engagement with the [facility] Managed Care team.   If you are experiencing a medical emergency, please call 911 or report to your local emergency department or urgent care.   If you have a non-emergency medical problem during routine business hours, please contact your provider's office and ask to speak with a nurse.   For questions related to your Healthy [facility] health plan, please call: [phone] or visit the homepage here: [email or web address]  If you would like to schedule transportation through your Healthy [facility] plan, please call the following number at least 2 days in advance of your appointment: [phone]  For information about your ride after you set it up, call Ride Assist at [phone]. Use this number to activate a Will Call pickup, or if your transportation is late for a scheduled pickup. Use this number, too, if you need to make a change or cancel a previously scheduled reservation.  If you need transportation services right away, call [phone]. The after-hours call center is staffed 24 hours to handle ride assistance and urgent reservation requests (including discharges) 365 days a year. Urgent trips include sick visits, hospital discharge requests and life-sustaining treatment.  Call the [facility] Line at [phone], at any time, 24 hours a day, 7 days a week. If you are in danger or need immediate medical attention call 911.  If you would like help to quit smoking, call 1-800-QUIT-NOW (6020091372) OR Espaol: 1-855-Djelo-Ya (1-884-166-0630) o para ms informacin haga clic aqu or Text READY to 160-109 to register via text  James [name],   Please see education materials related to COPD and alcohol  cessation provided as print materials.   Patient verbalizes understanding of instructions and care plan provided today and agrees to view in MyChart. Active MyChart status and patient understanding of how to access instructions and care plan via MyChart confirmed with patient.     Telephone follow up appointment with Managed Medicaid care management team member scheduled for:01/19/23 at 11:15am  [name] RN, BSN [place]  [facility] RN Care Coordinator [phone]   Following is a copy of your plan of care:  There are no care plans that you recently modified to display for this patient.   Dr. Jonah [name]- Primary Care Doctor    [facility]                                                       [address], [address]                                                       [place] [address]  [phone]     Dr. Ralene [name] -Foot doctor             [facility]                                                 [address], [address]                                                 [place] [address]                                                 [phone]     Dr. Tonia [name] and Dr. Margarito [name] doctor    [facility]                                                              [address]                                                             [place] [address]                                                             [phone]     Dr. Clovis [name] doctor                      [facility]                                                             [address]                                                            [place] [address]  [phone]     Dr. Willodean [name]  doctor               [facility]                                                        [address] [place], [place] [address]                                                         [phone] Visit Information  James [name] was given information about Medicaid Managed Care team care coordination services as a part of their Healthy [facility] Medicaid benefit. James [name] verbally consented to engagement with the [facility] Managed Care team.   If you are experiencing a medical emergency, please call 911 or report to your local emergency department or urgent care.   If you have a non-emergency medical problem during routine business hours, please contact your provider's office and ask to speak with a nurse.   For questions related to your Healthy [facility] health plan, please call: [phone] or visit the homepage here: [email or web address]  If you would like to schedule transportation through your Healthy [facility] plan, please call the following number at least 2 days in advance of your appointment: [phone]  For information about your ride after you set it up, call Ride Assist at [phone]. Use this number to activate a Will Call pickup, or if your transportation is late for a scheduled pickup. Use this number, too, if you need to make a change or cancel a previously scheduled reservation.  If you need transportation services right away, call [phone]. The after-hours call center is staffed 24 hours to handle ride assistance and urgent reservation requests (including discharges) 365 days a year. Urgent trips include sick visits, hospital discharge requests and life-sustaining treatment.  Call the [facility] Line at [phone], at any time, 24 hours a day, 7 days a week. If you are in danger or need immediate medical attention call 911.  If  you would like help to quit smoking, call 1-800-QUIT-NOW (774-290-8980) OR Espaol: 1-855-Djelo-Ya (5-462-703-5009) o para ms informacin haga clic aqu or Text READY to 381-829 to register via text  James [name],   Please see education materials related to COPD and alcohol use provided as print materials.   The patient verbalized understanding of instructions, educational materials, and care plan provided today and agreed to receive a mailed copy of patient instructions, educational materials, and care plan.   Telephone follow up appointment with Managed Medicaid care management team member scheduled for:01/19/23 at 11:15am  [name] RN, BSN [place]  [facility] RN Care Coordinator [phone]   Following is a copy of your plan of care:  Care Plan : RN Care Manager Plan of Care  Updates made by [name], RN since 12/23/2022 12:00 AM     Problem: Chronic Disease Management needs in patient with COPD   Priority: High     Long-Range Goal: Plan of care established for for Chronic Disease management for patient with COPD   Start Date: 11/13/2020  Expected End Date: 12/03/2022  Priority: High  Note:   Current Barriers:  Care Coordination needs related to Limited access to food  Chronic Disease Management support and education needs related to COPD    RNCM Clinical Goal(s):  Patient will verbalize understanding of plan for management of COPD as evidenced by taking all medications as prescribed, attending all scheduled appointments and contacting provider with questions and concerns take all medications exactly as prescribed and will call provider for medication related questions as evidenced by refilling medications and taking as prescribed    attend all scheduled medical appointments: [facility] on 12/01/22 as evidenced by provider notes in EMR        demonstrate improved adherence to prescribed treatment plan for COPD as evidenced by  taking all medications as prescribed, attending all scheduled appointments and contacting provider with questions and concerns continue to work with RN Care Manager and/or Social Worker to address care management and care coordination needs related to COPD as evidenced by adherence to CM Team Scheduled appointments     through collaboration with Medical illustrator, provider, and care team.   Interventions: Inter-disciplinary care team collaboration (see longitudinal plan of care) Evaluation of current treatment plan related to  self management and patient's adherence to plan as established by provider  Provided patient with education on iron rich foods Assisted patient with rescheduling missed appointment with [facility] Provided a list of provider contact information and address to each office Discussed the importance of patient learning to make and reschedule appointments and arrange transportation independently Provided encouragement and guidance   COPD: (Status: Goal on Track (progressing): YES.) Long Term Goal  Reviewed medications with patient, assisted with requesting needed refills Advised patient to self assesses COPD action plan zone and make appointment with provider if in the yellow zone for 48 hours without improvement Provided education about and advised patient to utilize infection prevention strategies to reduce risk of respiratory infection Discussed the importance of adequate rest and management of fatigue with COPD Discussed triggers and avoiding being around fires and fumes  Discussed the importance of handwashing and wearing a mask in public, explained the importance of infection prevention Assisted with scheduling transportation for appointment on 12/28/22 with PCP pick up from home at 8:15-8:45am, return ride home at 11am Advised patient to work quitting drinking alcohol  Patient Goals/Self-Care Activities: Take medications as prescribed   Attend all scheduled  provider appointments Call pharmacy for medication refills 3-7 days in advance of running out of medications Perform all self care activities independently  Call provider office for new concerns or questions  - avoid second hand smoke - limit outdoor activity during cold weather - keep follow-up appointments: 12/01/22 with Dr. Ralene [name] and Hospital Follow up on 12/08/22 - eat healthy/prescribed diet: DASH - get at least 7 to 8 hours of sleep at night

## 2022-12-28 ENCOUNTER — Other Ambulatory Visit: Payer: Self-pay

## 2022-12-28 ENCOUNTER — Encounter: Payer: Self-pay | Admitting: Internal Medicine

## 2022-12-28 ENCOUNTER — Other Ambulatory Visit (HOSPITAL_COMMUNITY): Payer: Self-pay

## 2022-12-28 ENCOUNTER — Ambulatory Visit: Payer: Medicaid Other | Attending: Internal Medicine | Admitting: Internal Medicine

## 2022-12-28 VITALS — BP 132/81 | HR 101 | Temp 98.0°F | Ht 69.0 in | Wt 198.0 lb

## 2022-12-28 DIAGNOSIS — I152 Hypertension secondary to endocrine disorders: Secondary | ICD-10-CM

## 2022-12-28 DIAGNOSIS — J069 Acute upper respiratory infection, unspecified: Secondary | ICD-10-CM | POA: Diagnosis not present

## 2022-12-28 DIAGNOSIS — D509 Iron deficiency anemia, unspecified: Secondary | ICD-10-CM

## 2022-12-28 DIAGNOSIS — E1159 Type 2 diabetes mellitus with other circulatory complications: Secondary | ICD-10-CM

## 2022-12-28 DIAGNOSIS — E1141 Type 2 diabetes mellitus with diabetic mononeuropathy: Secondary | ICD-10-CM

## 2022-12-28 DIAGNOSIS — E119 Type 2 diabetes mellitus without complications: Secondary | ICD-10-CM

## 2022-12-28 DIAGNOSIS — Z1211 Encounter for screening for malignant neoplasm of colon: Secondary | ICD-10-CM | POA: Diagnosis not present

## 2022-12-28 DIAGNOSIS — J449 Chronic obstructive pulmonary disease, unspecified: Secondary | ICD-10-CM | POA: Diagnosis not present

## 2022-12-28 DIAGNOSIS — F1090 Alcohol use, unspecified, uncomplicated: Secondary | ICD-10-CM | POA: Diagnosis not present

## 2022-12-28 DIAGNOSIS — Z7984 Long term (current) use of oral hypoglycemic drugs: Secondary | ICD-10-CM | POA: Diagnosis not present

## 2022-12-28 DIAGNOSIS — I5032 Chronic diastolic (congestive) heart failure: Secondary | ICD-10-CM | POA: Diagnosis not present

## 2022-12-28 DIAGNOSIS — J9611 Chronic respiratory failure with hypoxia: Secondary | ICD-10-CM

## 2022-12-28 LAB — GLUCOSE, POCT (MANUAL RESULT ENTRY): POC Glucose: 186 mg/dL — AB (ref 70–99)

## 2022-12-28 LAB — POCT GLYCOSYLATED HEMOGLOBIN (HGB A1C): HbA1c, POC (controlled diabetic range): 5.6 % (ref 0.0–7.0)

## 2022-12-28 MED ORDER — FERROUS SULFATE 325 (65 FE) MG PO TABS
325.0000 mg | ORAL_TABLET | Freq: Every day | ORAL | 0 refills | Status: DC
  Filled 2022-12-28 (×3): qty 90, 90d supply, fill #0

## 2022-12-28 MED ORDER — PREDNISONE 10 MG PO TABS
10.0000 mg | ORAL_TABLET | Freq: Every day | ORAL | 1 refills | Status: DC
  Filled 2022-12-28 (×2): qty 30, 30d supply, fill #0
  Filled 2022-12-28: qty 90, 90d supply, fill #0

## 2022-12-28 NOTE — Patient Instructions (Signed)
You have iron deficiency anemia.  I have referred you to a gastroenterologist as you will need a colonoscopy for colon cancer screening.  Take the iron supplement daily as prescribed.  Prescription has been sent to your pharmacy.  I have referred you to a pulmonologist/lung doctor for your history of COPD.  Try to cut back to no more than 2 beers daily as discussed.

## 2022-12-28 NOTE — Progress Notes (Signed)
Patient ID: James [name], male    DOB: Jan 20, 1960  MRN: [id number]  CC: Diabetes (DM f/u. Med refill. /Congestion, runny nose, sneezing X1 week/No to flu vax.)   Subjective: James [name] is a 62 y.o. male who presents for chronic ds management.  PCP is Dr. Delford Field who has retired. His concerns today include:  Patient with history of DM type II, HTN, COPD mixed type, bronchiectasis, lung nodules, EtOH use disorder in remission, aortic root dilatation, diastolic CHF,  Patient was hospitalized last month with acute on chronic respiratory failure/COPD exacerbation. Found to have new iron deficiency anemia.  Given iron infusion and discharged on iron supplement 325 mg daily.  Also found to have mild decompensated CHF.  EF found to be improved from 50-55 last year now 55 to 60% currently.  Today: COPD:  Congestion, runny nose, sneezing X1 week. No increase cough No fever Some pain upper back LT side x 1 wk; thinks he has pneumonia On 2 Lt PRN and sleeps with it on.  Used his O2 3x during the day  over past wk. Using Breztri BID; Albuterol PRN.  Uses Albuterol neb at bedtime Quit smoking 13 years ago but chews snuff. On prednisone 10 mg daily chronically through previous PCP Dr. Delford Field  EtOH use disorder: Drinks 4-5 12 oz beers daily.  Use to drink even more in past  IDA:  ran out iron supplement that was prescribed for him on recent hospital discharge.  Looks like he has had anemia for the past year but iron studies were just checked during recent hospitalization.. No blood in stools Dizziness only when he coughs.  No fatigue  HTN:  on Metoprolol XL 50 mg and Cozaar 100 mg daily; took already for this.  No device to check blood pressure.  Tries to limit salt in the foods.  DM: Results for orders placed or performed in visit on 12/28/22  POCT glycosylated hemoglobin (Hb A1C)   Collection Time: 12/28/22  9:32 AM  Result Value Ref Range   Hemoglobin A1C     HbA1c POC (<> result,  manual entry)     HbA1c, POC (prediabetic range)     HbA1c, POC (controlled diabetic range) 5.6 0.0 - 7.0 %  POCT glucose (manual entry)   Collection Time: 12/28/22  9:32 AM  Result Value Ref Range   POC Glucose 186 (A) 70 - 99 mg/dl   *Note: Due to a large number of results and/or encounters for the requested time period, some results have not been displayed. A complete set of results can be found in Results Review.  Does not check BS but does have device On Glucotrol 5 mg daily XL.  On Lyrica for peripheral neuropathy in legs and feet and finds it very helpful. Taking Lipitor  CHF:  taking Furosemide 40 mg daily, Toprol-XL 50 mg daily and Cozaar 100 mg daily.  Limits salt in foods  Patient Active Problem List   Diagnosis Date Noted   COPD exacerbation (HCC) 11/22/2022   Dysphagia 10/01/2022   Alcohol dependence in remission (HCC) 03/02/2022   Aortic root dilation (HCC) 03/02/2022   Vision changes 12/29/2021   Oropharyngeal dysphagia 12/29/2021   Onychomycosis 12/29/2021   Malnutrition of moderate degree 12/05/2021   Tinea cruris 07/21/2021   Multiple lung nodules on CT 06/11/2021   COPD with chronic bronchitis (HCC) 02/01/2021   GERD (gastroesophageal reflux disease) 02/01/2021   Neuropathy 09/29/2020   Bronchiectasis (HCC) 09/24/2019   Type 2 diabetes mellitus (  HCC) 07/24/2019   Other atopic dermatitis 05/01/2019   Acute on chronic respiratory failure (HCC) 04/23/2019   Intellectual disability 04/23/2019   HTN (hypertension) 05/17/2016   Former tobacco use 04/06/2016   COPD mixed type (HCC) 03/26/2016     Current Outpatient Medications on File Prior to Visit  Medication Sig Dispense Refill   albuterol (PROVENTIL) (2.5 MG/3ML) 0.083% nebulizer solution Inhale 3 mLs (2.5 mg total) by nebulization every 4 (four) hours as needed for wheezing or shortness of breath. (Patient taking differently: Take 2.5 mg by nebulization at bedtime.) 270 mL 1   albuterol (VENTOLIN HFA) 108  (90 Base) MCG/ACT inhaler Inhale 2 puffs into the lungs every 6 (six) hours as needed for wheezing or shortness of breath. (Patient taking differently: Inhale 2 puffs into the lungs in the morning and at bedtime.) 54 g 1   atorvastatin (LIPITOR) 10 MG tablet Take 1 tablet (10 mg total) by mouth daily. 90 tablet 3   Budeson-Glycopyrrol-Formoterol (BREZTRI AEROSPHERE) 160-9-4.8 MCG/ACT AERO Inhale 2 puffs into the lungs in the morning and at bedtime. 10.7 g 11   folic acid (FOLVITE) 1 MG tablet Take 1 tablet (1 mg total) by mouth daily. 90 tablet 1   furosemide (LASIX) 40 MG tablet Take 1 tablet (40 mg total) by mouth daily. 90 tablet 1   glipiZIDE (GLUCOTROL XL) 5 MG 24 hr tablet Take 1 tablet (5 mg total) by mouth daily with breakfast. 90 tablet 1   losartan (COZAAR) 100 MG tablet Take 1 tablet (100 mg total) by mouth daily. 90 tablet 2   metoprolol succinate (TOPROL-XL) 50 MG 24 hr tablet Take 1 tablet (50 mg total) by mouth daily. Take with or immediately following a meal. 90 tablet 2   Multiple Vitamin (MULTIVITAMIN WITH MINERALS) TABS tablet Take 1 tablet by mouth daily. 30 tablet 0   nystatin cream (MYCOSTATIN) Apply to affected areas 3 (three) times daily. (Patient taking differently: Apply 1 Application topically as needed (irritation).) 30 g 2   pantoprazole (PROTONIX) 40 MG tablet Take 1 tablet (40 mg total) by mouth daily. 90 tablet 1   pregabalin (LYRICA) 100 MG capsule Take 1 capsule (100 mg total) by mouth 2 (two) times daily. (Patient taking differently: Take 200 mg by mouth daily.) 60 capsule 1   No current facility-administered medications on file prior to visit.    No Known Allergies  Social History   Socioeconomic History   Marital status: Divorced    Spouse name: Not on file   Number of children: Not on file   Years of education: Not on file   Highest education level: Not on file  Occupational History   Occupation: Curator   Occupation: Painter  Tobacco Use   Smoking  status: Former    Current packs/day: 0.00    Average packs/day: 1.5 packs/day for 40.0 years (60.0 ttl pk-yrs)    Types: Cigarettes    Start date: 81    Quit date: 2014    Years since quitting: 10.9   Smokeless tobacco: Former    Types: Engineer, drilling   Vaping status: Never Used  Substance and Sexual Activity   Alcohol use: Yes    Alcohol/week: 4.0 standard drinks of alcohol    Types: 4 Cans of beer per week    Comment: 4 beers a night previously   Drug use: Not Currently    Types: Marijuana    Comment: daily; quit using crack and methamphetamine about 5 months ago  Sexual activity: Not Currently    Partners: Female  Other Topics Concern   Not on file  Social History Narrative   Lives alone near [place]   Social Drivers of Health   Financial Resource Strain: Low Risk  (12/25/2020)   Overall Financial Resource Strain (CARDIA)    Difficulty of Paying Living Expenses: Not very hard  Recent Concern: Financial Resource Strain - High Risk (10/29/2020)   Overall Financial Resource Strain (CARDIA)    Difficulty of Paying Living Expenses: Hard  Food Insecurity: No Food Insecurity (11/22/2022)   Hunger Vital Sign    Worried About Running Out of Food in the Last Year: Never true    Ran Out of Food in the Last Year: Never true  Transportation Needs: Unmet Transportation Needs (12/23/2022)   PRAPARE - Administrator, Civil Service (Medical): Yes    Lack of Transportation (Non-Medical): Yes  Physical Activity: Inactive (12/11/2020)   Exercise Vital Sign    Days of Exercise per Week: 0 days    Minutes of Exercise per Session: 0 min  Stress: No Stress Concern Present (12/25/2020)   [name] of Occupational Health - Occupational Stress Questionnaire    Feeling of Stress : Not at all  Social Connections: Moderately Integrated (11/13/2020)   Social Connection and Isolation Panel [NHANES]    Frequency of Communication with Friends and Family: More than three  times a week    Frequency of Social Gatherings with Friends and Family: Once a week    Attends Religious Services: 1 to 4 times per year    Active Member of [employer] or Organizations: Yes    Attends Banker Meetings: 1 to 4 times per year    Marital Status: Divorced  Intimate Partner Violence: Not At Risk (11/22/2022)   Humiliation, Afraid, Rape, and Kick questionnaire    Fear of Current or Ex-Partner: No    Emotionally Abused: No    Physically Abused: No    Sexually Abused: No    Family History  Problem Relation Age of Onset   Heart disease Father    COPD Sister     Past Surgical History:  Procedure Laterality Date   ESOPHAGEAL MANOMETRY N/A 09/15/2022   Procedure: ESOPHAGEAL MANOMETRY (EM);  Surgeon: Hilarie Fredrickson, MD;  Location: [facility];  Service: Gastroenterology;  Laterality: N/A;   LEFT HEART CATH AND CORONARY ANGIOGRAPHY N/A 12/07/2021   Procedure: LEFT HEART CATH AND CORONARY ANGIOGRAPHY;  Surgeon: Runell Gess, MD;  Location: [facility];  Service: Cardiovascular;  Laterality: N/A;   LUNG REMOVAL, PARTIAL     VIDEO ASSISTED THORACOSCOPY (VATS)/THOROCOTOMY Left 06/11/2013   Procedure: VIDEO ASSISTED THORACOSCOPY (VATS)/THOROCOTOMY;  Surgeon: Kerin Perna, MD;  Location[facility];  Service: Thoracic;  Laterality: Left;   VIDEO BRONCHOSCOPY N/A 06/11/2013   Procedure: VIDEO BRONCHOSCOPY;  Surgeon: Kerin Perna, MD;  Location: [facility] OR;  Service: Thoracic;  Laterality: N/A;   VIDEO BRONCHOSCOPY N/A 08/10/2018   Procedure: VIDEO BRONCHOSCOPY WITH FLUORO;  Surgeon: Lorin Glass, MD;  Location: [facility] END[facility];  Service: Endoscopy;  Laterality: N/A;    ROS: Review of Systems Negative except as stated above  PHYSICAL EXAM: BP 132/81   Pulse (!) 101   Temp 98 F (36.7 C) (Oral)   Ht 5\' 9"  (1.753 m)   Wt 198 lb (89.8 kg)   SpO2 96%   BMI 29.24 kg/m   Physical Exam General appearance -older Caucasian male in NAD.  He  has mild tremors with  movement of the hands.  He is a little hard of hearing so I have to speak in a louder tone of voice Mental status - normal mood, behavior, speech, dress, motor activity, and thought processes Mouth - mucous membranes moist, pharynx normal without lesions Neck - supple, no significant adenopathy Chest -mildly decreased but no wheezes or crackles heard Heart - normal rate, regular rhythm, normal S1, S2, no murmurs, rubs, clicks or gallops Extremities -no lower extremity edema.     Latest Ref Rng & Units 11/23/2022    6:43 AM 11/22/2022    8:41 AM 07/16/2022    6:12 AM  CMP  Glucose 70 - 99 mg/dL 696  295  99   BUN 8 - 23 mg/dL 27  21  29    Creatinine 0.61 - 1.24 mg/dL 2.84  1.32  4.40   Sodium 135 - 145 mmol/L 139  139  139   Potassium 3.5 - 5.1 mmol/L 4.2  4.2  4.6   Chloride 98 - 111 mmol/L 102  105  104   CO2 22 - 32 mmol/L 26  25  31    Calcium 8.9 - 10.3 mg/dL 9.2  9.2  8.6   Total Protein 6.5 - 8.1 g/dL  5.9    Total Bilirubin <1.2 mg/dL  0.7    Alkaline Phos 38 - 126 U/L  42    AST 15 - 41 U/L  26    ALT 0 - 44 U/L  30     Lipid Panel     Component Value Date/Time   CHOL 158 03/02/2022 0912   TRIG 140 03/02/2022 0912   HDL 52 03/02/2022 0912   CHOLHDL 3.0 03/02/2022 0912   LDLCALC 82 03/02/2022 0912    CBC    Component Value Date/Time   WBC 12.9 (H) 11/23/2022 0643   RBC 4.05 (L) 11/23/2022 0643   HGB 10.6 (L) 11/23/2022 0643   HGB 11.4 (L) 06/29/2022 1204   HCT 34.7 (L) 11/23/2022 0643   HCT 35.1 (L) 06/29/2022 1204   PLT 275 11/23/2022 0643   PLT 267 06/29/2022 1204   MCV 85.7 11/23/2022 0643   MCV 91 06/29/2022 1204   MCH 26.2 11/23/2022 0643   MCHC 30.5 11/23/2022 0643   RDW 14.3 11/23/2022 0643   RDW 13.2 06/29/2022 1204   LYMPHSABS 1.7 11/22/2022 0841   LYMPHSABS 1.2 06/29/2022 1204   MONOABS 1.5 (H) 11/22/2022 0841   EOSABS 0.2 11/22/2022 0841   EOSABS 0.2 06/29/2022 1204   BASOSABS 0.1 11/22/2022 0841   BASOSABS 0.0 06/29/2022 1204     ASSESSMENT AND PLAN: 1. Type 2 diabetes mellitus with diabetic mononeuropathy, without long-term current use of insulin (HCC) (Primary) At goal.  Continue glipizide XL 5 mg daily.  Discussed and encouraged healthy eating habits.  Continue Lyrica for neuropathy symptoms. - POCT glycosylated hemoglobin (Hb A1C) - POCT glucose (manual entry) - Comprehensive metabolic panel  2. Diabetes mellitus treated with oral medication (HCC) See #1 above.  3. Chronic respiratory failure with hypoxia (HCC) Since Dr. Delford Field has retired, I recommend referral to pulmonary and patient is agreeable.  Continue O2 2 L as needed and at bedtime - Ambulatory referral to Pulmonology  4. COPD mixed type (HCC) Continue Breztri inhaler as prescribed and albuterol as needed.  Patient on 10 mg daily of prednisone chronically.  Encouraged him to discontinue chewing snuff - predniSONE (DELTASONE) 10 MG tablet; Take 1 tablet (10 mg total) by mouth daily  with breakfast.  Dispense: 90 tablet; Refill: 1 - Ambulatory referral to Pulmonology  5. Hypertension associated with type 2 diabetes mellitus (HCC) Close to goal.  Continue Cozaar, metoprolol and furosemide  6. Alcohol use disorder Strongly encouraged him to cut back on the amount of beer that he drinks to no more than two 12 oz a day.  Discussed health risks associated with excessive alcohol use.  7. Iron deficiency anemia, unspecified iron deficiency anemia type Looks like he has never had colonoscopy/EGD.  Will refer to gastroenterology.  Refill given on iron supplement - Ambulatory referral to Gastroenterology - ferrous sulfate 325 (65 FE) MG tablet; Take 1 tablet (325 mg total) by mouth daily with breakfast.  Dispense: 90 tablet; Refill: 0 - CBC  8. Chronic diastolic congestive heart failure (HCC) Stable.  Continue metoprolol, Cozaar and furosemide  9. Colon cancer screening - Ambulatory referral to Gastroenterology  10. Upper respiratory tract  infection, unspecified type Current symptoms seem to be URI.  Continue inhalers.  Continue his prednisone.   Patient was given the opportunity to ask questions.  Patient verbalized understanding of the plan and was able to repeat key elements of the plan.   This documentation was completed using Paediatric nurse.  Any transcriptional errors are unintentional.  Orders Placed This Encounter  Procedures   CBC   Comprehensive metabolic panel   Ambulatory referral to Gastroenterology   Ambulatory referral to Pulmonology   POCT glycosylated hemoglobin (Hb A1C)   POCT glucose (manual entry)     Requested Prescriptions   Signed Prescriptions Disp Refills   predniSONE (DELTASONE) 10 MG tablet 90 tablet 1    Sig: Take 1 tablet (10 mg total) by mouth daily with breakfast.   ferrous sulfate 325 (65 FE) MG tablet 90 tablet 0    Sig: Take 1 tablet (325 mg total) by mouth daily with breakfast.    Return in about 4 months (around 04/28/2023).  Jonah Blue, MD, FACP

## 2022-12-29 ENCOUNTER — Telehealth: Payer: Self-pay | Admitting: Internal Medicine

## 2022-12-29 LAB — COMPREHENSIVE METABOLIC PANEL
ALT: 26 [IU]/L (ref 0–44)
AST: 26 [IU]/L (ref 0–40)
Albumin: 4.5 g/dL (ref 3.9–4.9)
Alkaline Phosphatase: 54 [IU]/L (ref 44–121)
BUN/Creatinine Ratio: 23 (ref 10–24)
BUN: 21 mg/dL (ref 8–27)
Bilirubin Total: <0.2 mg/dL (ref 0.0–1.2)
CO2: 24 mmol/L (ref 20–29)
Calcium: 9.6 mg/dL (ref 8.6–10.2)
Chloride: 101 mmol/L (ref 96–106)
Creatinine, Ser: 0.91 mg/dL (ref 0.76–1.27)
Globulin, Total: 1.7 g/dL (ref 1.5–4.5)
Glucose: 132 mg/dL — ABNORMAL HIGH (ref 70–99)
Potassium: 4.9 mmol/L (ref 3.5–5.2)
Sodium: 141 mmol/L (ref 134–144)
Total Protein: 6.2 g/dL (ref 6.0–8.5)
eGFR: 96 mL/min/{1.73_m2} (ref >59)

## 2022-12-29 LAB — CBC
Hematocrit: 41.5 % (ref 37.5–51.0)
Hemoglobin: 13.1 g/dL (ref 13.0–17.7)
MCH: 29 pg (ref 26.6–33.0)
MCHC: 31.6 g/dL (ref 31.5–35.7)
MCV: 92 fL (ref 79–97)
Platelets: 271 10*3/uL (ref 150–450)
RBC: 4.51 x10E6/uL (ref 4.14–5.80)
RDW: 17.4 % — ABNORMAL HIGH (ref 11.6–15.4)
WBC: 13 10*3/uL — ABNORMAL HIGH (ref 3.4–10.8)

## 2022-12-29 NOTE — Telephone Encounter (Signed)
No longer anemic on blood tests. STOP Iron supplement called Ferrous Sulfate.

## 2022-12-30 NOTE — Telephone Encounter (Signed)
Referral was submitted to gastroenterology on his most recent visit for colon cancer screening.  They will call him to schedule.

## 2022-12-30 NOTE — Telephone Encounter (Signed)
Called & spoke to the patient. Verified name & DOB. Informed of results and to stop taking his iron supplement called ferrous sulfate. Patient expressed verbal understanding of all discussed.  James [name] would like to know if he should get his colonoscopy. Informed that it may be best as a preventative measure but would like to consult PCP. Please advise.

## 2022-12-31 ENCOUNTER — Telehealth: Payer: Self-pay | Admitting: Internal Medicine

## 2022-12-31 NOTE — Telephone Encounter (Signed)
Called & spoke to the patient. Verified name & DOB. Informed patient that per Dr.Johnson he was referred a colon cancer screening. Informed patient to wait to be called but gave patient that the referral was placed in [facility]# [phone]. Patient expressed verbal understanding of all discussed.

## 2022-12-31 NOTE — Telephone Encounter (Signed)
Pt is calling back for James [name] concerned for labs or results. I only found notification of call.

## 2022-12-31 NOTE — Telephone Encounter (Signed)
Pt was informed of lab results.  Pt is returning you call.

## 2022-12-31 NOTE — Telephone Encounter (Signed)
Called but no answer. Unable to LVM due to VM being full.

## 2023-01-02 ENCOUNTER — Emergency Department (HOSPITAL_COMMUNITY): Payer: Medicaid Other

## 2023-01-02 ENCOUNTER — Other Ambulatory Visit: Payer: Self-pay

## 2023-01-02 ENCOUNTER — Encounter (HOSPITAL_COMMUNITY): Payer: Self-pay | Admitting: Emergency Medicine

## 2023-01-02 ENCOUNTER — Emergency Department (HOSPITAL_COMMUNITY)
Admission: EM | Admit: 2023-01-02 | Discharge: 2023-01-02 | Disposition: A | Discharge status: Home / Self Care | Payer: Medicaid Other | Attending: Emergency Medicine | Admitting: Emergency Medicine

## 2023-01-02 DIAGNOSIS — I10 Essential (primary) hypertension: Secondary | ICD-10-CM | POA: Insufficient documentation

## 2023-01-02 DIAGNOSIS — J189 Pneumonia, unspecified organism: Secondary | ICD-10-CM | POA: Diagnosis not present

## 2023-01-02 DIAGNOSIS — Z8616 Personal history of COVID-19: Secondary | ICD-10-CM | POA: Insufficient documentation

## 2023-01-02 DIAGNOSIS — J441 Chronic obstructive pulmonary disease with (acute) exacerbation: Secondary | ICD-10-CM | POA: Insufficient documentation

## 2023-01-02 DIAGNOSIS — R0603 Acute respiratory distress: Secondary | ICD-10-CM | POA: Diagnosis not present

## 2023-01-02 DIAGNOSIS — R069 Unspecified abnormalities of breathing: Secondary | ICD-10-CM | POA: Diagnosis not present

## 2023-01-02 DIAGNOSIS — I959 Hypotension, unspecified: Secondary | ICD-10-CM | POA: Diagnosis not present

## 2023-01-02 DIAGNOSIS — R0602 Shortness of breath: Secondary | ICD-10-CM | POA: Diagnosis not present

## 2023-01-02 DIAGNOSIS — E119 Type 2 diabetes mellitus without complications: Secondary | ICD-10-CM | POA: Diagnosis not present

## 2023-01-02 DIAGNOSIS — I2510 Atherosclerotic heart disease of native coronary artery without angina pectoris: Secondary | ICD-10-CM | POA: Diagnosis not present

## 2023-01-02 DIAGNOSIS — S42002A Fracture of unspecified part of left clavicle, initial encounter for closed fracture: Secondary | ICD-10-CM | POA: Diagnosis not present

## 2023-01-02 DIAGNOSIS — Z87891 Personal history of nicotine dependence: Secondary | ICD-10-CM | POA: Insufficient documentation

## 2023-01-02 DIAGNOSIS — R140 Abdominal distension (gaseous): Secondary | ICD-10-CM | POA: Diagnosis not present

## 2023-01-02 LAB — COMPREHENSIVE METABOLIC PANEL
ALT: 25 U/L (ref 0–44)
AST: 28 U/L (ref 15–41)
Albumin: 3 g/dL — ABNORMAL LOW (ref 3.5–5.0)
Alkaline Phosphatase: 36 U/L — ABNORMAL LOW (ref 38–126)
Anion gap: 9 (ref 5–15)
BUN: 17 mg/dL (ref 8–23)
CO2: 24 mmol/L (ref 22–32)
Calcium: 8 mg/dL — ABNORMAL LOW (ref 8.9–10.3)
Chloride: 112 mmol/L — ABNORMAL HIGH (ref 98–111)
Creatinine, Ser: 0.69 mg/dL (ref 0.61–1.24)
GFR, Estimated: >60 mL/min (ref >60)
Glucose, Bld: 106 mg/dL — ABNORMAL HIGH (ref 70–99)
Potassium: 3.5 mmol/L (ref 3.5–5.1)
Sodium: 145 mmol/L (ref 135–145)
Total Bilirubin: 0.5 mg/dL (ref <1.2)
Total Protein: 4.9 g/dL — ABNORMAL LOW (ref 6.5–8.1)

## 2023-01-02 LAB — CBC
HCT: 41.1 % (ref 39.0–52.0)
Hemoglobin: 13.3 g/dL (ref 13.0–17.0)
MCH: 30.4 pg (ref 26.0–34.0)
MCHC: 32.4 g/dL (ref 30.0–36.0)
MCV: 94.1 fL (ref 80.0–100.0)
Platelets: 235 10*3/uL (ref 150–400)
RBC: 4.37 MIL/uL (ref 4.22–5.81)
RDW: 19.1 % — ABNORMAL HIGH (ref 11.5–15.5)
WBC: 18.8 10*3/uL — ABNORMAL HIGH (ref 4.0–10.5)
nRBC: 0 % (ref 0.0–0.2)

## 2023-01-02 LAB — TROPONIN I (HIGH SENSITIVITY)
Troponin I (High Sensitivity): 11 ng/L (ref <18)
Troponin I (High Sensitivity): 11 ng/L (ref <18)

## 2023-01-02 LAB — BRAIN NATRIURETIC PEPTIDE: B Natriuretic Peptide: 57.9 pg/mL (ref 0.0–100.0)

## 2023-01-02 MED ORDER — AZITHROMYCIN 250 MG PO TABS: 250.0000 mg | ORAL_TABLET | Freq: Every day | ORAL | 0 refills | Status: DC

## 2023-01-02 MED ORDER — PREDNISONE 20 MG PO TABS: 40.0000 mg | ORAL_TABLET | Freq: Every day | ORAL | 0 refills | Status: AC

## 2023-01-02 MED ORDER — ALBUTEROL SULFATE (2.5 MG/3ML) 0.083% IN NEBU
10.0000 mg | INHALATION_SOLUTION | Freq: Once | RESPIRATORY_TRACT | Status: AC
  Administered 2023-01-02: 10 mg via RESPIRATORY_TRACT
  Filled 2023-01-02: qty 12

## 2023-01-02 NOTE — ED Provider Notes (Signed)
Blood pressure (!) 151/87, pulse (!) 121, temperature 98.6 F (37 C), resp. rate 19, height 5\' 9"  (1.753 m), weight 89.8 kg, SpO2 95%.  Assuming care from Dr. Pilar Plate.  In short, James [name] is a 62 y.o. male with a chief complaint of Shortness of Breath .  Refer to the original H&P for additional details.  The current plan of care is to follow up second troponin and reassess. Significant respiratory distress on arrival now improved with CAT.   08:00 AM  Reassessed patient he is breathing comfortably on his home oxygen of 2 L nasal cannula.  He feels like after the continuous albuterol his breathing has returned to baseline.  Updated him that we will follow the second troponin and reassess at that time.   10:41 AM  Patient up and ambulatory in the ED on home O2 at 95-96%. Troponin negative. Patient feeling and looking well. No distress. Plan for d/c with steroid burst, abx, and continued MDI at home. Patient feels ready for discharge and comfortable with the plan.    Maia Plan, MD 01/02/23 1057

## 2023-01-02 NOTE — ED Provider Notes (Signed)
[facility] Emergency Department Provider Note MRN:  [id number]  Arrival date & time: 01/02/23     Chief Complaint   Shortness of Breath   History of Present Illness   James [name] is a 62 y.o. year-old male with a history of CAD, diabetes, COPD presenting to the ED with chief complaint of shortness of breath.  Shortness of breath starting this morning, associated with chest pain, wheezing.  Denies fever or cough.  Review of Systems  A thorough review of systems was obtained and all systems are negative except as noted in the HPI and PMH.   Patient's Health History    Past Medical History:  Diagnosis Date   Acute on chronic respiratory failure with hypoxia (HCC) 06/08/2013   Acute on chronic respiratory failure with hypoxia and hypercapnia (HCC) 06/08/2013   AKI (acute kidney injury) (HCC) 07/12/2022   Alcohol use disorder 11/23/2018   CAP (community acquired pneumonia) 05/17/2018   See admit 05/17/18 ? rml  with   covid pcr neg - rx augmentin > f/u cxr in 4-6 weeks is fine unless condition declines    Community acquired pneumonia 07/12/2021   Community acquired pneumonia of right lower lobe of lung 05/17/2018   See admit 05/17/18 ? 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   Recurrent pneumonia 02/20/2021   Tick bite 06/03/2018    Past Surgical History:  Procedure Laterality Date   ESOPHAGEAL MANOMETRY N/A 09/15/2022   Procedure: ESOPHAGEAL MANOMETRY (EM);  Surgeon: Hilarie Fredrickson,  MD;  Location: [facility];  Service: Gastroenterology;  Laterality: N/A;   LEFT HEART CATH AND CORONARY ANGIOGRAPHY N/A 12/07/2021   Procedure: LEFT HEART CATH AND CORONARY ANGIOGRAPHY;  Surgeon: Runell Gess, MD;  [facility] IN[facility];  Service: Cardiovascular;  Laterality: N/A;   LUNG REMOVAL, PARTIAL     VIDEO ASSISTED THORACOSCOPY (VATS)/THOROCOTOMY Left 06/11/2013   Procedure: VIDEO ASSISTED THORACOSCOPY (VATS)/THOROCOTOMY;  Surgeon: Kerin Perna, MD;  Location: [facility] OR;  Service: Thoracic;  Laterality: Left;   VIDEO BRONCHOSCOPY N/A 06/11/2013   Procedure: VIDEO BRONCHOSCOPY;  Surgeon: Kerin Perna, MD;  Location: [facility] OR;  Service: Thoracic;  Laterality: N/A;   VIDEO BRONCHOSCOPY N/A 08/10/2018   Procedure: VIDEO BRONCHOSCOPY WITH FLUORO;  Surgeon: Lorin Glass, MD;  Location: [facility] ENDOSCOPY;  Service: Endoscopy;  Laterality: N/A;    Family History  Problem Relation Age of Onset   Heart disease Father    COPD Sister     Social History   Socioeconomic History   Marital status: Divorced    Spouse name: Not on file   Number of children: Not on file   Years of education: Not on file   Highest education level: Not on file  Occupational History   Occupation: Curator   Occupation: Painter  Tobacco Use   Smoking status: Former    Current packs/day: 0.00    Average packs/day: 1.5 packs/day for 40.0 years (60.0 ttl pk-yrs)  Types: Cigarettes    Start date: 25    Quit date: 2014    Years since quitting: 11.0   Smokeless tobacco: Former    Types: Engineer, drilling   Vaping status: Never Used  Substance and Sexual Activity   Alcohol use: Yes    Alcohol/week: 4.0 standard drinks of alcohol    Types: 4 Cans of beer per week    Comment: 4 beers a night previously   Drug use: Not Currently    Types: Marijuana    Comment: daily; quit using crack and methamphetamine about 5 months ago    Sexual activity: Not Currently    Partners: Female  Other Topics Concern    Not on file  Social History Narrative   Lives alone near [place]   Social Drivers of Health   Financial Resource Strain: Low Risk  (12/25/2020)   Overall Financial Resource Strain (CARDIA)    Difficulty of Paying Living Expenses: Not very hard  Recent Concern: Financial Resource Strain - High Risk (10/29/2020)   Overall Financial Resource Strain (CARDIA)    Difficulty of Paying Living Expenses: Hard  Food Insecurity: No Food Insecurity (11/22/2022)   Hunger Vital Sign    Worried About Running Out of Food in the Last Year: Never true    Ran Out of Food in the Last Year: Never true  Transportation Needs: Unmet Transportation Needs (12/23/2022)   PRAPARE - Transportation    Lack of Transportation (Medical): Yes    Lack of Transportation (Non-Medical): Yes  Physical Activity: Inactive (12/11/2020)   Exercise Vital Sign    Days of Exercise per Week: 0 days    Minutes of Exercise per Session: 0 min  Stress: No Stress Concern Present (12/25/2020)   [name] of Occupational Health - Occupational Stress Questionnaire    Feeling of Stress : Not at all  Social Connections: Moderately Integrated (11/13/2020)   Social Connection and Isolation Panel [NHANES]    Frequency of Communication with Friends and Family: More than three times a week    Frequency of Social Gatherings with Friends and Family: Once a week    Attends Religious Services: 1 to 4 times per year    Active Member of [employer] or Organizations: Yes    Attends Banker Meetings: 1 to 4 times per year    Marital Status: Divorced  Catering manager Violence: Not At Risk (11/22/2022)   Humiliation, Afraid, Rape, and Kick questionnaire    Fear of Current or Ex-Partner: No    Emotionally Abused: No    Physically Abused: No    Sexually Abused: No     Physical Exam   Vitals:   01/02/23 0630 01/02/23 0645  BP: (!) 147/96 (!) 151/87  Pulse: (!) 118 (!) 121  Resp: (!) 30 19  Temp:    SpO2: 100% 100%     CONSTITUTIONAL: Ill-appearing, mild to moderate respiratory distress NEURO/PSYCH:  Alert and oriented x 3, no focal deficits EYES:  eyes equal and reactive ENT/NECK:  no LAD, no JVD CARDIO: Regular rate, well-perfused, normal S1 and S2 PULM: Pursed lip breathing, tachypneic, increased work of breathing, diffuse wheezing and poor air movement GI/GU:  non-distended, non-tender MSK/SPINE:  No gross deformities, no edema SKIN:  no rash, atraumatic   *Additional and/or pertinent findings included in MDM below  Diagnostic and Interventional Summary    EKG Interpretation Date/Time:  Sunday January 02 2023 06:02:38 EST Ventricular Rate:  112 PR Interval:  169 QRS Duration:  101 QT Interval:  345 QTC Calculation: 471 R Axis:   30  Text Interpretation: Sinus tachycardia Atrial premature complex Probable anteroseptal infarct, old Confirmed by Kennis Carina [phone]) on 01/02/2023 6:32:16 AM       Labs Reviewed  CBC - Abnormal; Notable for the following components:      Result Value   WBC 18.8 (*)    RDW 19.1 (*)    All other components within normal limits  COMPREHENSIVE METABOLIC PANEL - Abnormal; Notable for the following components:   Chloride 112 (*)    Glucose, Bld 106 (*)    Calcium 8.0 (*)    Total Protein 4.9 (*)    Albumin 3.0 (*)    Alkaline Phosphatase 36 (*)    All other components within normal limits  BRAIN NATRIURETIC PEPTIDE  TROPONIN I (HIGH SENSITIVITY)    DG Chest Port 1 View  Final Result      Medications  albuterol (PROVENTIL) (2.5 MG/3ML) 0.083% nebulizer solution 10 mg (10 mg Nebulization Given 01/02/23 0606)     Procedures  /  Critical Care Procedures  ED Course and Medical Decision Making  Initial Impression and Ddx Suspect COPD exacerbation, pulm provide continuous albuterol treatment.  Received steroids, magnesium with EMS.  Will see if he needs BiPAP.  Past medical/surgical history that increases complexity of ED encounter:  COPD  Interpretation of Diagnostics I personally reviewed the EKG and my interpretation is as follows: Sinus tachycardia  Labs reveal leukocytosis, negative troponin  Patient Reassessment and Ultimate Disposition/Management     Awaiting second troponin, patient completing albuterol treatment and seems improved.  Signed out to oncoming provider at shift change.  Patient management required discussion with the following services or consulting groups:  None  Complexity of Problems Addressed Acute illness or injury that poses threat of life of bodily function  Additional Data Reviewed and Analyzed Further history obtained from: EMS on arrival  Additional Factors Impacting ED Encounter Risk Consideration of hospitalization  Elmer Sow. Pilar Plate, MD [facility] Emergency Medicine [facility] [email or web address]  Final Clinical Impressions(s) / ED Diagnoses     ICD-10-CM   1. COPD exacerbation (HCC)  J44.1       ED Discharge Orders     None        Discharge Instructions Discussed with and Provided to Patient:   Discharge Instructions   None      Sabas Sous, MD 01/02/23 0700

## 2023-01-02 NOTE — Discharge Instructions (Signed)
Please take your steroids and antibiotics as prescribed. Continue your inhaler use at home. Return with any new or worsening symptoms.

## 2023-01-02 NOTE — ED Triage Notes (Signed)
PT BIB EMS from home with c/o [facility] that woke him up. Wears 2lpm Ong chronic. No lungs sounds before meds with ems, now has wheezing.   2 duonebs 125 mgSolumedrol 2g Mag

## 2023-01-02 NOTE — ED Notes (Signed)
PT ambulated in hallway with 2L O2 running, saturating at 96% with ambulation

## 2023-01-03 ENCOUNTER — Other Ambulatory Visit: Payer: Self-pay | Admitting: *Deleted

## 2023-01-03 NOTE — Patient Outreach (Signed)
Care Coordination  01/03/2023  DARAIN OSSMAN [birth year] [id number]   RNCM returning call. Mr. Smigielski expresses concern regarding upcoming office visit with Dr. Juanell Fairly) and transportation. RNCM explained that transportation had not been arranged at this time. RNCM will reach out at next scheduled phone visit on 01/19/23 and we will work together to schedule transportation. Mr. Shamblen voiced understanding. RNCM reviewed recent ED visit and reviewed newly prescribed medications and ensured patient is taking as directed. RNCM will follow up with Mr. Delaguila on 01/19/23.  [name] RN, BSN [place]  [facility] RN Care Coordinator [phone]

## 2023-01-05 DIAGNOSIS — J9601 Acute respiratory failure with hypoxia: Secondary | ICD-10-CM | POA: Diagnosis not present

## 2023-01-05 DIAGNOSIS — J9621 Acute and chronic respiratory failure with hypoxia: Secondary | ICD-10-CM | POA: Diagnosis not present

## 2023-01-05 DIAGNOSIS — F79 Unspecified intellectual disabilities: Secondary | ICD-10-CM | POA: Diagnosis not present

## 2023-01-07 DIAGNOSIS — J9601 Acute respiratory failure with hypoxia: Secondary | ICD-10-CM | POA: Diagnosis not present

## 2023-01-07 DIAGNOSIS — J9621 Acute and chronic respiratory failure with hypoxia: Secondary | ICD-10-CM | POA: Diagnosis not present

## 2023-01-07 DIAGNOSIS — F79 Unspecified intellectual disabilities: Secondary | ICD-10-CM | POA: Diagnosis not present

## 2023-01-10 ENCOUNTER — Other Ambulatory Visit (HOSPITAL_COMMUNITY): Payer: Self-pay

## 2023-01-10 ENCOUNTER — Other Ambulatory Visit: Payer: Self-pay

## 2023-01-19 ENCOUNTER — Other Ambulatory Visit: Payer: Self-pay | Admitting: *Deleted

## 2023-01-19 NOTE — Patient Instructions (Signed)
 Visit Information  James [name] was given information about Medicaid Managed Care team care coordination services as a part of their Healthy [facility] Medicaid benefit. [name] verbally consented to engagement with the [facility] Managed Care team.   If you are experiencing a medical emergency, please call 911 or report to your local emergency department or urgent care.   If you have a non-emergency medical problem during routine business hours, please contact your provider's office and ask to speak with a nurse.   For questions related to your Healthy [facility] health plan, please call: [phone] or visit the homepage here: [email or web address]  If you would like to schedule transportation through your Healthy [facility] plan, please call the following number at least 2 days in advance of your appointment: [phone]  For information about your ride after you set it up, call Ride Assist at [phone]. Use this number to activate a Will Call pickup, or if your transportation is late for a scheduled pickup. Use this number, too, if you need to make a change or cancel a previously scheduled reservation.  If you need transportation services right away, call [phone]. The after-hours call center is staffed 24 hours to handle ride assistance and urgent reservation requests (including discharges) 365 days a year. Urgent trips include sick visits, hospital discharge requests and life-sustaining treatment.  Call the [facility] Line at [phone], at any time, 24 hours a day, 7 days a week. If you are in danger or need immediate medical attention call 911.  If you would like help to quit smoking, call 1-800-QUIT-NOW (301 391 5110) OR Espaol: 1-855-Djelo-Ya (5-188-416-6063) o para ms informacin haga clic aqu or Text READY to 016-010 to register via text  James [name],   Please see education materials related to Health Maintenance  provided as print materials.   The patient verbalized understanding of instructions, educational materials, and care plan provided today and agreed to receive a mailed copy of patient instructions, educational materials, and care plan.   Telephone follow up appointment with Managed Medicaid care management team member scheduled for:02/24/23 at 10:30am  James [name], BSN [place]  Value-Based Care [facility] [name] Care Manager [phone]   Following is a copy of your plan of care:  Care Plan : [name] Care Manager Plan of Care  Updates made by Aura Leeds, [name] since 01/19/2023 12:00 AM     Problem: Chronic Disease Management needs in patient with COPD   Priority: High     Long-Range Goal: Plan of care established for for Chronic Disease management for patient with COPD   Start Date: 11/13/2020  Expected End Date: 03/04/2023  Priority: High  Note:   Current Barriers:  Care Coordination needs related to Limited access to food  Chronic Disease Management support and education needs related to COPD    RNCM Clinical Goal(s):  Patient will verbalize understanding of plan for management of COPD as evidenced by taking all medications as prescribed, attending all scheduled appointments and contacting provider with questions and concerns take all medications exactly as prescribed and will call provider for medication related questions as evidenced by refilling medications and taking as prescribed    attend all scheduled medical appointments: [facility] on 01/26/23 as evidenced by provider notes in EMR        demonstrate improved adherence to prescribed treatment plan for COPD as evidenced by taking all medications as prescribed, attending all scheduled appointments and contacting provider with questions and concerns continue to work  with [name] Care Manager and/or Social Worker to address care management and care coordination needs related to COPD as evidenced by adherence to CM Team  Scheduled appointments     through collaboration with [name] Care manager, provider, and care team.   Interventions: Inter-disciplinary care team collaboration (see longitudinal plan of care) Evaluation of current treatment plan related to  self management and patient's adherence to plan as established by provider  Ensured patient received provided list of provider contact information and address to each office Discussed the importance of patient learning to make and reschedule appointments and arrange transportation independently Provided encouragement   COPD: (Status: Goal on Track (progressing): YES.) Long Term Goal  Reviewed medications with patient, updated EMR Advised patient to self assesses COPD action plan zone and make appointment with provider if in the yellow zone for 48 hours without improvement Provided education about and advised patient to utilize infection prevention strategies to reduce risk of respiratory infection Discussed the importance of adequate rest and management of fatigue with COPD Discussed triggers and avoiding being around fires and fumes  Discussed the importance of handwashing and wearing a mask in public, explained the importance of infection prevention Reviewed upcoming appointments including 01/26/23 at [facility] Advised patient to work quitting drinking alcohol Assisted with scheduling follow up with Pulmonology [phone]/04/23 at 11am Assisted with scheduling transportation to [facility] 01/26/23, pick up at 2:15pm and return ride home at 4:30pm; to Pulmonology on 02/04/23, pick up at 9:55am and return ride home at 12:30pm-patient physically wrote down details   Patient Goals/Self-Care Activities: Take medications as prescribed   Attend all scheduled provider appointments Call pharmacy for medication refills 3-7 days in advance of running out of medications Perform all self care activities independently  Call provider office for new concerns or questions  - avoid  second hand smoke - limit outdoor activity during cold weather - keep follow-up appointments: 01/26/23 with Dr. Sikora - eat healthy/prescribed diet: DASH - get at least 7 to 8 hours of sleep at night

## 2023-01-19 NOTE — Patient Outreach (Signed)
 Medicaid Managed Care   Nurse Care Manager Note  01/19/2023 Name:  James [name] MRN:  [id number] DOB:  July 13, 1960  James [name] is an 63 y.o. year old male who is [name] primary [name] of James Presume, [name].  The [facility] Managed Care Coordination team was consulted for assistance with:    COPD  James [name] was given information about Medicaid Managed Care Coordination team services today. James [name] agreed to services and verbal consent obtained.  Engaged with [name] by telephone for follow up visit in response to provider referral for case management and/or care coordination services.   [name] is participating in [name] Managed Medicaid Plan:  Yes  Assessments/Interventions:  Review of past medical history, allergies, medications, health status, including review of consultants reports, laboratory and other test data, was performed as part of comprehensive evaluation and provision of chronic care management services.  SDOH (Social Drivers of Health) assessments and interventions performed: SDOH Interventions    Flowsheet Row [name] Outreach Telephone from 12/23/2022 in [place] POPULATION HEALTH DEPARTMENT [name] Outreach Telephone from 10/13/2022 in [place] POPULATION HEALTH DEPARTMENT Telephone from 07/16/2022 in [place] POPULATION HEALTH DEPARTMENT [name] Outreach Telephone from 06/30/2022 in [place] POPULATION HEALTH DEPARTMENT [name] Outreach Telephone from 05/25/2022 in [place] POPULATION HEALTH DEPARTMENT [name] Outreach Telephone from 04/19/2022 in [place] POPULATION HEALTH DEPARTMENT  SDOH Interventions        Food Insecurity Interventions -- -- -- -- -- Intervention Not Indicated  Housing Interventions -- -- -- -- Intervention Not Indicated --  Transportation Interventions --  [Assisted [name] with arranging transportation to upcoming appointment] Payor Benefit Payor Benefit Payor Benefit  [assisted with arranging transportation] Payor Benefit   [Assisted with arranging transportation to upcoming appointments] --  Utilities Interventions -- -- -- -- -- Intervention Not Indicated       Care Plan  No Known Allergies  Medications Reviewed Today     Reviewed by James Leeds, RN (Registered Nurse) on 01/19/23 at 1120  Med List Status: <None>   Medication Order Taking? Sig Documenting Provider Last Dose Status Informant  albuterol  (PROVENTIL ) (2.5 MG/3ML) 0.083% nebulizer solution [id number] Yes Inhale 3 mLs (2.5 mg total) by nebulization every 4 (four) hours as needed for wheezing or shortness of breath.  [name] taking differently: Take 2.5 mg by nebulization at bedtime.   James Goldsmith, [name] Taking Active Self, Pharmacy Records  albuterol  (VENTOLIN  HFA) 108 (90 Base) MCG/ACT inhaler [id number] Yes Inhale 2 puffs into the lungs every 6 (six) hours as needed for wheezing or shortness of breath.  [name] taking differently: Inhale 2 puffs into the lungs in the morning and at bedtime.   James Goldsmith, [name] Taking Active Self, Pharmacy Records  atorvastatin  (LIPITOR) 10 MG tablet [id number] Yes Take 1 tablet (10 mg total) by mouth daily. James Goldsmith, [name] Taking Active Self, Pharmacy Records  azithromycin  (ZITHROMAX ) 250 MG tablet [id number] No Take 1 tablet (250 mg total) by mouth daily. Take first 2 tablets together, then 1 every day until finished.  [name] not taking: Reported on 01/19/2023   James [name] Not Taking Active   Budeson-Glycopyrrol-Formoterol  (BREZTRI  AEROSPHERE) 160-9-4.8 MCG/ACT AERO [id number] Yes Inhale 2 puffs into the lungs in the morning and at bedtime. James Goldsmith, [name] Taking Active Self, Pharmacy Records           Med Note James [name]   Tue Nov 02, 2022 10:18 AM)    ferrous sulfate   325 (65 FE) MG tablet [id number] No Take 1 tablet (325 mg total) by mouth daily with breakfast.  [name] not taking: Reported on 01/19/2023   James Presume, [name] Not Taking Active   folic acid  (FOLVITE ) 1 MG  tablet [id number] Yes Take 1 tablet (1 mg total) by mouth daily. James Goldsmith, [name] Taking Active Self, Pharmacy Records           Med Note James [name]   Thu Dec 23, 2022 11:47 AM)    furosemide  (LASIX ) 40 MG tablet [id number] Yes Take 1 tablet (40 mg total) by mouth daily. James Goldsmith, [name] Taking Active Self, Pharmacy Records  glipiZIDE  (GLUCOTROL  XL) 5 MG 24 hr tablet [id number] Yes Take 1 tablet (5 mg total) by mouth daily with breakfast. James Goldsmith, [name] Taking Active Self, Pharmacy Records  losartan  (COZAAR ) 100 MG tablet [id number] Yes Take 1 tablet (100 mg total) by mouth daily. James Goldsmith, [name] Taking Active Self, Pharmacy Records  metoprolol  succinate (TOPROL -XL) 50 MG 24 hr tablet [id number] Yes Take 1 tablet (50 mg total) by mouth daily. Take with or immediately following [name] meal. James Goldsmith, [name] Taking Active Self, Pharmacy Records           Med Note James [name]   Thu Dec 23, 2022 11:47 AM)    nystatin  cream (MYCOSTATIN ) [id number] Yes Apply to affected areas 3 (three) times daily.  [name] taking differently: Apply 1 Application topically as needed (irritation).   James Goldsmith, [name] Taking Active Self, Pharmacy Records  pantoprazole  (PROTONIX ) 40 MG tablet [id number] Yes Take 1 tablet (40 mg total) by mouth daily. James Goldsmith, [name] Taking Active Self, Pharmacy Records           Med Note James [name]   Thu Dec 23, 2022 11:48 AM)    predniSONE  (DELTASONE ) 10 MG tablet [id number] Yes Take 10 mg by mouth daily with breakfast. Provider, Historical, [name] Taking Active   pregabalin  (LYRICA ) 100 MG capsule [id number] Yes Take 1 capsule (100 mg total) by mouth 2 (two) times daily.  [name] taking differently: Take 200 mg by mouth daily.   James Mulberry, [name] Taking Active Self, Pharmacy Records           Med Note James [name]   Wed Nov 24, 2022 10:42 AM) Taking once daily, every AM            [name] Active Problem List   Diagnosis Date Noted    COPD exacerbation (HCC) 11/22/2022   Dysphagia 10/01/2022   Alcohol dependence in remission (HCC) 03/02/2022   Aortic root dilation (HCC) 03/02/2022   Vision changes 12/29/2021   Oropharyngeal dysphagia 12/29/2021   Onychomycosis 12/29/2021   Malnutrition of moderate degree 12/05/2021   Tinea cruris 07/21/2021   Multiple lung nodules on CT 06/11/2021   COPD with chronic bronchitis (HCC) 02/01/2021   GERD (gastroesophageal reflux disease) 02/01/2021   Neuropathy 09/29/2020   Bronchiectasis (HCC) 09/24/2019   Type 2 diabetes mellitus (HCC) 07/24/2019   Other atopic dermatitis 05/01/2019   Acute on chronic respiratory failure (HCC) 04/23/2019   Intellectual disability 04/23/2019   HTN (hypertension) 05/17/2016   Former tobacco use 04/06/2016   COPD mixed type (HCC) 03/26/2016    Conditions to be addressed/monitored per PCP order:  COPD  Care Plan : RN Care Manager Plan of Care  Updates made by James Leeds, RN since 01/19/2023 12:00 AM     Problem: Chronic Disease  Management needs in [name] with COPD   Priority: High     Long-Range Goal: Plan of care established for for Chronic Disease management for [name] with COPD   Start Date: 11/13/2020  Expected End Date: 03/04/2023  Priority: High  Note:   Current Barriers:  Care Coordination needs related to Limited access to food  Chronic Disease Management support and education needs related to COPD    RNCM Clinical Goal(s):  [name] will verbalize understanding of plan for management of COPD as evidenced by taking all medications as prescribed, attending all scheduled appointments and contacting provider with questions and concerns take all medications exactly as prescribed and will call provider for medication related questions as evidenced by refilling medications and taking as prescribed    attend all scheduled medical appointments: T[facility] on 01/26/23 as evidenced by provider notes in EMR        demonstrate improved adherence  to prescribed treatment plan for COPD as evidenced by taking all medications as prescribed, attending all scheduled appointments and contacting provider with questions and concerns continue to work with RN Care Manager and/or Social Worker to address care management and care coordination needs related to COPD as evidenced by adherence to CM Team Scheduled appointments     through collaboration with Medical illustrator, provider, and care team.   Interventions: Inter-disciplinary care team collaboration (see longitudinal plan of care) Evaluation of current treatment plan related to  self management and [name]'s adherence to plan as established by provider  Ensured [name] received provided list of provider contact information and address to each office Discussed the importance of [name] learning to make and reschedule appointments and arrange transportation independently Provided encouragement   COPD: (Status: Goal on Track (progressing): YES.) Long Term Goal  Reviewed medications with [name], updated EMR Advised [name] to self assesses COPD action plan zone and make appointment with provider if in the yellow zone for 48 hours without improvement Provided education about and advised [name] to utilize infection prevention strategies to reduce risk of respiratory infection Discussed the importance of adequate rest and management of fatigue with COPD Discussed triggers and avoiding being around fires and fumes  Discussed the importance of handwashing and wearing [name] mask in public, explained the importance of infection prevention Reviewed upcoming appointments including 01/26/23 at [facility] Advised [name] to work quitting drinking alcohol Assisted with scheduling follow up with Pulmonology [phone]/04/23 at 11am Assisted with scheduling transportation to [facility] 01/26/23, pick up at 2:15pm and return ride home at 4:30pm; to Pulmonology on 02/04/23, pick up at 9:55am and return ride home at 12:30pm-[name]  physically wrote down details   [name] Goals/Self-Care Activities: Take medications as prescribed   Attend all scheduled provider appointments Call pharmacy for medication refills 3-7 days in advance of running out of medications Perform all self care activities independently  Call provider office for new concerns or questions  - avoid second hand smoke - limit outdoor activity during cold weather - keep follow-up appointments: 01/26/23 with Dr. Sikora - eat healthy/prescribed diet: DASH - get at least 7 to 8 hours of sleep at night       Follow Up:  [name] agrees to Care Plan and Follow-up.  Plan: The Managed Medicaid care management team will reach out to the [name] again over the next 30 days.  Date/time of next scheduled RN care management/care coordination outreach:  02/24/23 at 10:30am  Arna Better RN, BSN [place]  [facility] RN Care Manager [phone]

## 2023-01-26 ENCOUNTER — Encounter: Payer: Self-pay | Admitting: Podiatry

## 2023-01-26 ENCOUNTER — Ambulatory Visit: Payer: Medicaid Other | Admitting: Podiatry

## 2023-01-26 DIAGNOSIS — E119 Type 2 diabetes mellitus without complications: Secondary | ICD-10-CM

## 2023-01-26 DIAGNOSIS — M79674 Pain in right toe(s): Secondary | ICD-10-CM | POA: Diagnosis not present

## 2023-01-26 DIAGNOSIS — B351 Tinea unguium: Secondary | ICD-10-CM

## 2023-01-26 DIAGNOSIS — M79675 Pain in left toe(s): Secondary | ICD-10-CM

## 2023-01-26 NOTE — Progress Notes (Signed)
  Subjective:  Patient ID: James [name], male    DOB: 1960/07/10,   MRN: [id number]  Chief Complaint  Patient presents with   Nail Problem    RFC     63 y.o. male presents for concern of thickened elongated and painful nails that are difficult to trim. Requesting to have them trimmed today. Relates burning and tingling in their feet. Patient is diabetic and last A1c was  Lab Results  Component Value Date   HGBA1C 5.6 12/28/2022   .   PCP:  Marcine Matar, MD    . Denies any other pedal complaints. Denies n/v/f/c.   Past Medical History:  Diagnosis Date   Acute on chronic respiratory failure with hypoxia (HCC) 06/08/2013   Acute on chronic respiratory failure with hypoxia and hypercapnia (HCC) 06/08/2013   AKI (acute kidney injury) (HCC) 07/12/2022   Alcohol use disorder 11/23/2018   CAP (community acquired pneumonia) 05/17/2018   See admit 05/17/18 ? rml  with   covid pcr neg - rx augmentin > f/u cxr in 4-6 weeks is fine unless condition declines    Community acquired pneumonia 07/12/2021   Community acquired pneumonia of right lower lobe of lung 05/17/2018   See admit 05/17/18 ? 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 CFT <3 seconds. Absent hair growth on digits. Edema noted to bilateral lower extremities. Xerosis noted bilaterally.  Skin. No lacerations or abrasions bilateral  feet. Nails 1-5 bilateral  are thickened discolored and elongated with subungual debris. Small abrasion to plantar left foot. No erythema edema or purulence noted.  Musculoskeletal: MMT 5/5 bilateral lower extremities in DF, PF, Inversion and Eversion. Deceased ROM in DF of ankle joint.  Neurological: Sensation intact to light touch. Protective sensation diminished bilateral.    Assessment:   1. Pain due to onychomycosis of toenails of both feet   2. Type 2 diabetes mellitus without complication, without long-term current use of insulin (HCC)        Plan:  Patient was evaluated and treated and all questions answered. -Discussed and educated patient on diabetic foot care, especially with  regards to the vascular, neurological and musculoskeletal systems.  -Stressed the importance of good glycemic control and the detriment of not  controlling glucose levels in relation to the foot. -Discussed supportive shoes at all times and checking feet regularly.  -Mechanically debrided all nails 1-5 bilateral using sterile nail nipper and filed with dremel without incident  -Answered all patient questions -Patient to return  in 3 months for at risk foot care -Patient advised to call the office if any problems or questions arise in the meantime.   Louann Sjogren, DPM

## 2023-01-28 ENCOUNTER — Other Ambulatory Visit: Payer: Self-pay

## 2023-01-28 ENCOUNTER — Other Ambulatory Visit (HOSPITAL_COMMUNITY): Payer: Self-pay

## 2023-02-02 ENCOUNTER — Other Ambulatory Visit: Payer: Self-pay

## 2023-02-02 ENCOUNTER — Other Ambulatory Visit (HOSPITAL_COMMUNITY): Payer: Self-pay

## 2023-02-02 ENCOUNTER — Other Ambulatory Visit: Payer: Self-pay | Admitting: Internal Medicine

## 2023-02-02 ENCOUNTER — Other Ambulatory Visit: Payer: Self-pay | Admitting: Family Medicine

## 2023-02-02 DIAGNOSIS — J449 Chronic obstructive pulmonary disease, unspecified: Secondary | ICD-10-CM

## 2023-02-02 MED ORDER — PREGABALIN 100 MG PO CAPS
100.0000 mg | ORAL_CAPSULE | Freq: Two times a day (BID) | ORAL | 3 refills | Status: DC
  Filled 2023-02-02: qty 60, 30d supply, fill #0
  Filled 2023-03-22 (×2): qty 60, 30d supply, fill #1
  Filled 2023-04-20 (×2): qty 60, 30d supply, fill #2
  Filled 2023-05-18: qty 60, 30d supply, fill #3

## 2023-02-02 NOTE — Telephone Encounter (Signed)
Requested medication (s) are due for refill today - provider review   Requested medication (s) are on the active medication list -yes  Future visit scheduled -yes  Last refill: unsure  Notes to clinic: non delegated Rx- listed as historical  Requested Prescriptions  Pending Prescriptions Disp Refills   predniSONE (DELTASONE) 10 MG tablet 90 tablet 1    Sig: Take 1 tablet (10 mg total) by mouth daily with breakfast.     Not Delegated - Endocrinology:  Oral Corticosteroids Failed - 02/02/2023 10:18 AM      Failed - This refill cannot be delegated      Failed - Manual Review: Eye exam for IOP if prolonged treatment      Failed - Glucose (serum) in normal range and within 180 days    Glucose, Bld  Date Value Ref Range Status  01/02/2023 106 (H) 70 - 99 mg/dL Final    Comment:    Glucose reference range applies only to samples taken after fasting for at least 8 hours.   POC Glucose  Date Value Ref Range Status  12/28/2022 186 (A) 70 - 99 mg/dl Final   Glucose-Capillary  Date Value Ref Range Status  11/23/2022 196 (H) 70 - 99 mg/dL Final    Comment:    Glucose reference range applies only to samples taken after fasting for at least 8 hours.         Failed - Last BP in normal range    BP Readings from Last 1 Encounters:  01/02/23 (!) 147/66         Failed - Bone Mineral Density or Dexa Scan completed in the last 2 years      Passed - K in normal range and within 180 days    Potassium  Date Value Ref Range Status  01/02/2023 3.5 3.5 - 5.1 mmol/L Final         Passed - Na in normal range and within 180 days    Sodium  Date Value Ref Range Status  01/02/2023 145 135 - 145 mmol/L Final  12/28/2022 141 134 - 144 mmol/L Final         Passed - Valid encounter within last 6 months    Recent Outpatient Visits           1 month ago Type 2 diabetes mellitus with diabetic mononeuropathy, without long-term current use of insulin (HCC)   [place] [facility] - A  Dept Of [place]. [facility] Jonah Blue B, MD   4 months ago Type 2 diabetes mellitus with diabetic mononeuropathy, without long-term current use of insulin (HCC)   [place] [facility] [place] - A Dept Of [place]. [facility] [name], MD   6 months ago COPD with acute exacerbation [facility])   [place] [facility] - A Dept Of [place]. [facility] [name], MD   7 months ago Acute on chronic respiratory failure with hypoxia and hypercapnia [facility])   [place] [facility] - A Dept Of [place]. [facility] [name], MD   7 months ago COPD with chronic bronchitis   [place] [facility] [place] - A Dept Of . [facility] [name], MD       Future Appointments             In 2 months Terri Piedra, DO [facility] Dermatology   In 2  months Marcine Matar, MD [facility] [place] - A Dept Of [place]. [facility]               Requested Prescriptions  Pending Prescriptions Disp Refills   predniSONE (DELTASONE) 10 MG tablet 90 tablet 1    Sig: Take 1 tablet (10 mg total) by mouth daily with breakfast.     Not Delegated - Endocrinology:  Oral Corticosteroids Failed - 02/02/2023 10:18 AM      Failed - This refill cannot be delegated      Failed - Manual Review: Eye exam for IOP if prolonged treatment      Failed - Glucose (serum) in normal range and within 180 days    Glucose, Bld  Date Value Ref Range Status  01/02/2023 106 (H) 70 - 99 mg/dL Final    Comment:    Glucose reference range applies only to samples taken after fasting for at least 8 hours.   POC Glucose  Date Value Ref Range Status  12/28/2022 186 (A) 70 - 99 mg/dl Final   Glucose-Capillary  Date Value Ref Range Status  11/23/2022 196 (H) 70 - 99 mg/dL Final    Comment:    Glucose reference range applies only to samples taken after fasting for at least 8 hours.          Failed - Last BP in normal range    BP Readings from Last 1 Encounters:  01/02/23 (!) 147/66         Failed - Bone Mineral Density or Dexa Scan completed in the last 2 years      Passed - K in normal range and within 180 days    Potassium  Date Value Ref Range Status  01/02/2023 3.5 3.5 - 5.1 mmol/L Final         Passed - Na in normal range and within 180 days    Sodium  Date Value Ref Range Status  01/02/2023 145 135 - 145 mmol/L Final  12/28/2022 141 134 - 144 mmol/L Final         Passed - Valid encounter within last 6 months    Recent Outpatient Visits           1 month ago Type 2 diabetes mellitus with diabetic mononeuropathy, without long-term current use of insulin (HCC)   [place] [facility] - A Dept Of [place]. [facility] Jonah Blue B, MD   4 months ago Type 2 diabetes mellitus with diabetic mononeuropathy, without long-term current use of insulin (HCC)   [place] [facility] [place] - A Dept Of [place]. [facility] [name], MD   6 months ago COPD with acute exacerbation [facility])   [place] [facility] - A Dept Of [place]. [facility] [name], MD   7 months ago Acute on chronic respiratory failure with hypoxia and hypercapnia [facility])   [place] [facility] - A Dept Of [place]. [facility] [name], MD   7 months ago COPD with chronic bronchitis   [place] [facility] [place] - A Dept Of [place]. [facility] [name], MD       Future Appointments             In 2 months Terri Piedra, DO [facility] Dermatology   In 2 months Marcine Matar, MD [facility] [place] -  A Dept Of [place]. [facility]

## 2023-02-04 ENCOUNTER — Other Ambulatory Visit: Payer: Self-pay

## 2023-02-04 ENCOUNTER — Ambulatory Visit: Payer: Medicaid Other | Admitting: Acute Care

## 2023-02-04 ENCOUNTER — Encounter: Payer: Self-pay | Admitting: Acute Care

## 2023-02-04 VITALS — BP 117/68 | HR 77 | Ht 69.0 in | Wt 200.4 lb

## 2023-02-04 DIAGNOSIS — R918 Other nonspecific abnormal finding of lung field: Secondary | ICD-10-CM | POA: Diagnosis not present

## 2023-02-04 DIAGNOSIS — Z8709 Personal history of other diseases of the respiratory system: Secondary | ICD-10-CM

## 2023-02-04 NOTE — Progress Notes (Unsigned)
History of Present Illness James [name] is a 63 y.o. male former smoker  with pertinent PMH of COPD on Trelegy and albuterol with several exacerbations over the last year, HTN, T2DM, atopic dermatitis who presented 01/27/2022  as a new patient referral for lung nodules noted on imaging 12/2021 during recent hospitalization for CAP.    Smoking history of 2 PPD for 20 years starting at age 20. He quit smoking in 2014 after an accident at work. 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 His breathing at this time is the same as it has been for several years and he has grown accustomed to pursed-lip breathing. He has had several hospitalizations over the last year for pneumonia which he states did not provide him much relief overall. He states that he is doing well with Trelegy daily but using his PRN albuterol inhalor up to 4 times per day. Breathing is worse in hot weather but not positional.    He saw Dr. Tonia [name] 01/27/2022, and plan was for follow up CT Chest to re-evaluate the pulmonary nodules. Virtual visit with me 02/26/2022 to discuss follow up Ct Chest which showed resolution of lung nodules consistent with interval resolution of an infectious or inflammatory process.Plan was for referral to Lung Cancer Screening Program and follow up scan 02/2023.   02/04/2023 Pt. Presents for follow up. He is a former patient of Dr. Tonia [name] who needs to establish care  with one of our MD's . He states he has been doing well. He is using his Breztri daily as prescribed. He uses his rescue nebs twice  daily. He did have a flare 01/02/2023, which required an ER visit. He has not flared since.    Test Results:     Latest Ref Rng & Units 01/02/2023    6:02 AM 12/28/2022   10:11 AM 11/23/2022    6:43 AM  CBC  WBC 4.0 - 10.5 K/uL 18.8  13.0  12.9   Hemoglobin 13.0 - 17.0 g/dL 13.2  44.0  10.2   Hematocrit 39.0 - 52.0 % 41.1  41.5  34.7   Platelets  150 - 400 K/uL 235  271  275        Latest Ref Rng & Units 01/02/2023    6:02 AM 12/28/2022   10:11 AM 11/23/2022    6:43 AM  BMP  Glucose 70 - 99 mg/dL 725  366  440   BUN 8 - 23 mg/dL 17  21  27    Creatinine 0.61 - 1.24 mg/dL 3.47  4.25  9.56   BUN/Creat Ratio 10 - 24  23    Sodium 135 - 145 mmol/L 145  141  139   Potassium 3.5 - 5.1 mmol/L 3.5  4.9  4.2   Chloride 98 - 111 mmol/L 112  101  102   CO2 22 - 32 mmol/L 24  24  26    Calcium 8.9 - 10.3 mg/dL 8.0  9.6  9.2     BNP    Component Value Date/Time   BNP 57.9 01/02/2023 0602    ProBNP    Component Value Date/Time   PROBNP 120.4 06/10/2013 0500    PFT    Component Value Date/Time   FEV1PRE 1.60 01/27/2022 1344   FEV1POST 1.51 01/27/2022 1344   FVCPRE 2.95 01/27/2022 1344   FVCPOST 2.90 01/27/2022 1344   TLC 6.64 01/27/2022 1344   DLCOUNC 16.85 01/27/2022 1344   PREFEV1FVCRT 54 01/27/2022  1344   PSTFEV1FVCRT 52 01/27/2022 1344    No results found.   Past medical hx Past Medical History:  Diagnosis Date   Acute on chronic respiratory failure with hypoxia (HCC) 06/08/2013   Acute on chronic respiratory failure with hypoxia and hypercapnia (HCC) 06/08/2013   AKI (acute kidney injury) (HCC) 07/12/2022   Alcohol use disorder 11/23/2018   CAP (community acquired pneumonia) 05/17/2018   See admit 05/17/18 ? rml  with   covid pcr neg - rx augmentin > f/u cxr in 4-6 weeks is fine unless condition declines    Community acquired pneumonia 07/12/2021   Community acquired pneumonia of right lower lobe of lung 05/17/2018   See admit 05/17/18 ? 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   Recurrent pneumonia 02/20/2021   Tick bite 06/03/2018     Social History   Tobacco Use   Smoking status: Former    Current packs/day: 0.00    Average packs/day: 1.5 packs/day for 40.0 years (60.0 ttl pk-yrs)    Types: Cigarettes    Start date: 27    Quit date: 2014    Years since quitting: 11.0   Smokeless tobacco: Former    Types: Chew  Vaping Use   Vaping status: Never Used  Substance Use Topics   Alcohol use: Yes    Alcohol/week: 4.0 standard drinks of alcohol    Types: 4 Cans of beer per week    Comment: 4 beers a night previously   Drug use: Not Currently    Types: Marijuana    Comment: daily; quit using crack and methamphetamine about 5 months ago     [name] reports that he quit smoking about 11 years ago. His smoking use included cigarettes. He started smoking about 51 years ago. He has a 60 pack-year smoking history. He has quit using smokeless tobacco.  His smokeless tobacco use included chew. He reports current alcohol use of about 4.0 standard drinks of alcohol per week. He reports that he does not currently use drugs after having used the following drugs: Marijuana.  Tobacco Cessation: Counseling given: Not Answered   Past surgical hx, Family hx, Social hx all reviewed.  Current Outpatient Medications on File Prior to Visit  Medication Sig   albuterol (PROVENTIL) (2.5 MG/3ML) 0.083% nebulizer solution Inhale 3 mLs (2.5 mg total) by nebulization every 4 (four) hours as needed for wheezing or shortness of breath. (Patient taking differently: Take 2.5 mg by nebulization at bedtime.)   albuterol (VENTOLIN HFA) 108 (90 Base) MCG/ACT inhaler Inhale 2 puffs into the lungs every 6 (six) hours as needed for wheezing or shortness of breath. (Patient taking differently: Inhale 2 puffs into the lungs in the morning and at  bedtime.)   atorvastatin (LIPITOR) 10 MG tablet Take 1 tablet (10 mg total) by mouth daily.   azithromycin (ZITHROMAX) 250 MG tablet Take 1 tablet (250 mg total) by mouth daily. Take first 2 tablets together, then 1 every day until finished.  Budeson-Glycopyrrol-Formoterol (BREZTRI AEROSPHERE) 160-9-4.8 MCG/ACT AERO Inhale 2 puffs into the lungs in the morning and at bedtime.   ferrous sulfate 325 (65 FE) MG tablet Take 1 tablet (325 mg total) by mouth daily with breakfast.   folic acid (FOLVITE) 1 MG tablet Take 1 tablet (1 mg total) by mouth daily.   furosemide (LASIX) 40 MG tablet Take 1 tablet (40 mg total) by mouth daily.   glipiZIDE (GLUCOTROL XL) 5 MG 24 hr tablet Take 1 tablet (5 mg total) by mouth daily with breakfast.   losartan (COZAAR) 100 MG tablet Take 1 tablet (100 mg total) by mouth daily.   metoprolol succinate (TOPROL-XL) 50 MG 24 hr tablet Take 1 tablet (50 mg total) by mouth daily. Take with or immediately following a meal.   nystatin cream (MYCOSTATIN) Apply to affected areas 3 (three) times daily. (Patient taking differently: Apply 1 Application topically as needed (irritation).)   pantoprazole (PROTONIX) 40 MG tablet Take 1 tablet (40 mg total) by mouth daily.   predniSONE (DELTASONE) 10 MG tablet Take 10 mg by mouth daily with breakfast.   pregabalin (LYRICA) 100 MG capsule Take 1 capsule (100 mg total) by mouth 2 (two) times daily.   No current facility-administered medications on file prior to visit.     No Known Allergies  Review Of Systems:  Constitutional:   No  weight loss, night sweats,  Fevers, chills, fatigue, or  lassitude.  HEENT:   No headaches,  Difficulty swallowing,  Tooth/dental problems, or  Sore throat,                No sneezing, itching, ear ache, nasal congestion, post nasal drip,   CV:  No chest pain,  Orthopnea, PND, swelling in lower extremities, anasarca, dizziness, palpitations, syncope.   GI  No heartburn, indigestion, abdominal pain,  nausea, vomiting, diarrhea, change in bowel habits, loss of appetite, bloody stools.   Resp: No shortness of breath with exertion or at rest.  No excess mucus, no productive cough,  No non-productive cough,  No coughing up of blood.  No change in color of mucus.  No wheezing.  No chest wall deformity  Skin: no rash or lesions.  GU: no dysuria, change in color of urine, no urgency or frequency.  No flank pain, no hematuria   MS:  No joint pain or swelling.  No decreased range of motion.  No back pain.  Psych:  No change in mood or affect. No depression or anxiety.  No memory loss.   Vital Signs BP 117/68 (BP Location: Right Arm, Patient Position: Sitting, Cuff Size: Large)   Pulse 77   Ht 5\' 9"  (1.753 m)   Wt 200 lb 6.4 oz (90.9 kg)   SpO2 97%   BMI 29.59 kg/m    Physical Exam:  General- No distress,  A&Ox3 ENT: No sinus tenderness, TM clear, pale nasal mucosa, no oral exudate,no post nasal drip, no LAN Cardiac: S1, S2, regular rate and rhythm, no murmur Chest: No wheeze/ rales/ dullness; no accessory muscle use, no nasal flaring, no sternal retractions Abd.: Soft Non-tender Ext: No clubbing cyanosis, edema Neuro:  normal strength Skin: No rashes, warm and dry Psych: normal mood and behavior   Assessment/Plan  No problem-specific Assessment & Plan notes found for this encounter.  Assessment and Plan: Abnormal CT Chest imaging in a former smoker ( 60 pack year smoking history, quit 2014) Smoked 1.5 PPD x 40 years Follow up scanning shows resolution >> consistent with  interval resolution of an infectious or inflammatory process. Plan Will enroll patient in lung cancer screening program First scan will be due 02/2023 Call to be seen sooner for blood in sputum or weight loss that you cannot explain   COPD Plan Continue Breztri 2 puffs in the morning and 2 puffs in the evening  Rinse mouth after use Use rescue albuterol as needed for breakthrough shortness of breath or  wheezing  Note your daily symptoms > remember "red flags" for COPD:  Increase in cough, increase in sputum production, increase in shortness of breath or activity intolerance. If you notice these symptoms, please call to be seen.    Follow up as needed  Please contact office for sooner follow up if symptoms do not improve or worsen or seek emergency care     Note your daily symptoms > remember "red flags" for COPD:  Increase in cough, increase in sputum production, increase in shortness of breath or activity intolerance. If you notice these symptoms, please call to be seen.      Bevelyn Ngo, NP 02/04/2023  10:52 AM

## 2023-02-04 NOTE — Patient Instructions (Addendum)
  It is good to see you today. Im glad you are doing well.  I have ordered a CT Chest. This is due after 02/24/2023 You will get a call to get this scheduled.  We will then do a phone call to go over results Continue Breztri 2 puffs in the morning and 2 puffs in the evening  Rinse mouth after use Use rescue albuterol as needed for breakthrough shortness of breath or wheezing  Note your daily symptoms > remember "red flags" for COPD:  Increase in cough, increase in sputum production, increase in shortness of breath or activity intolerance. If you notice these symptoms, please call to be seen.    Follow up as needed  Please contact office for sooner follow up if symptoms do not improve or worsen or seek emergency care   Needs to establish with Dr. Francine Graven as new Primary Pulmonary MD.   Note your daily symptoms > remember "red flags" for COPD:  Increase in cough, increase in sputum production, increase in shortness of breath or activity intolerance. If you notice these symptoms, please call to be seen.

## 2023-02-05 ENCOUNTER — Telehealth: Payer: Self-pay | Admitting: Internal Medicine

## 2023-02-05 ENCOUNTER — Other Ambulatory Visit (HOSPITAL_COMMUNITY): Payer: Self-pay

## 2023-02-05 ENCOUNTER — Encounter: Payer: Self-pay | Admitting: Acute Care

## 2023-02-05 DIAGNOSIS — J9601 Acute respiratory failure with hypoxia: Secondary | ICD-10-CM | POA: Diagnosis not present

## 2023-02-05 DIAGNOSIS — J9621 Acute and chronic respiratory failure with hypoxia: Secondary | ICD-10-CM | POA: Diagnosis not present

## 2023-02-05 DIAGNOSIS — F79 Unspecified intellectual disabilities: Secondary | ICD-10-CM | POA: Diagnosis not present

## 2023-02-05 MED ORDER — PREDNISONE 10 MG PO TABS
10.0000 mg | ORAL_TABLET | Freq: Every day | ORAL | 0 refills | Status: DC
  Filled 2023-02-05: qty 30, 30d supply, fill #0

## 2023-02-05 NOTE — Telephone Encounter (Signed)
I referred patient to [facility] pulmonary on his most recent visit with me in December.  Let him know that [place] said he is already established with Dr Tonia Brooms. Patient will need to call [phone] to make a f/u appt with the Dr. Tonia Brooms or his PA/NP.

## 2023-02-07 ENCOUNTER — Other Ambulatory Visit: Payer: Self-pay

## 2023-02-07 DIAGNOSIS — J9621 Acute and chronic respiratory failure with hypoxia: Secondary | ICD-10-CM | POA: Diagnosis not present

## 2023-02-07 DIAGNOSIS — F79 Unspecified intellectual disabilities: Secondary | ICD-10-CM | POA: Diagnosis not present

## 2023-02-07 DIAGNOSIS — J9601 Acute respiratory failure with hypoxia: Secondary | ICD-10-CM | POA: Diagnosis not present

## 2023-02-08 ENCOUNTER — Other Ambulatory Visit (HOSPITAL_COMMUNITY): Payer: Self-pay

## 2023-02-08 ENCOUNTER — Other Ambulatory Visit: Payer: Self-pay

## 2023-02-08 NOTE — Telephone Encounter (Signed)
 Called & spoke to the patient. Verified name & DOB. Informed that he is already established with [facility] with Dr.Icard. Informed patient of office phone number and to schedule a follow-up appointment. Patient expressed verbal understanding of all discussed.

## 2023-02-15 ENCOUNTER — Ambulatory Visit: Payer: Self-pay | Admitting: Internal Medicine

## 2023-02-15 DIAGNOSIS — R0902 Hypoxemia: Secondary | ICD-10-CM | POA: Diagnosis not present

## 2023-02-15 DIAGNOSIS — R0689 Other abnormalities of breathing: Secondary | ICD-10-CM | POA: Diagnosis not present

## 2023-02-15 DIAGNOSIS — R062 Wheezing: Secondary | ICD-10-CM | POA: Diagnosis not present

## 2023-02-15 DIAGNOSIS — I213 ST elevation (STEMI) myocardial infarction of unspecified site: Secondary | ICD-10-CM | POA: Diagnosis not present

## 2023-02-15 DIAGNOSIS — I959 Hypotension, unspecified: Secondary | ICD-10-CM | POA: Diagnosis not present

## 2023-02-15 NOTE — Telephone Encounter (Signed)
Copied from CRM [id number]. Topic: Clinical - Red Word Triage >> Feb 15, 2023  2:15 PM Ivette P wrote: Red Word that prompted transfer to Nurse Triage: Pt is having a hard time breathing. Feels like ammonia. Has a break out. He is swollen, full body swelling.  Chief Complaint: difficulty breathing Symptoms: wheezing, SOB,  Full body swelling:especially face, rash Frequency: x 3 days Pertinent Negatives: Patient denies chest pain Disposition: [x] ED /[] Urgent Care (no appt availability in office) / [] Appointment(In office/virtual)/ []  [facility]/ [] Home Care/ [] Refused Recommended Disposition /[] [facility]/ []  Follow-up with PCP Additional Notes: Pt's sister called stated concerns of pt's breathing issues & he goes through these episodes every 2 weeks.  She has not seen him lately but understands he has swelling all over his body, has developed a rash, experiencing SOB, wheezing, wears continuous O2 & has a history or recurrent pneumonia.  Reason for Disposition  SEVERE difficulty breathing (e.g., struggling for each breath, speaks in single words)  Answer Assessment - Initial Assessment Questions 1. RESPIRATORY STATUS: "Describe your breathing?" (e.g., wheezing, shortness of breath, unable to speak, severe coughing)      Wheezing  2. ONSET: "When did this breathing problem begin?"      X 3 days  3. PATTERN "Does the difficult breathing come and go, or has it been constant since it started?"      constantly 4. SEVERITY: "How bad is your breathing?" (e.g., mild, moderate, severe)    - MILD: No SOB at rest, mild SOB with walking, speaks normally in sentences, can lie down, no retractions, pulse < 100.    - MODERATE: SOB at rest, SOB with minimal exertion and prefers to sit, cannot lie down flat, speaks in phrases, mild retractions, audible wheezing, pulse 100-120.    - SEVERE: Very SOB at rest, speaks in single words, struggling to breathe, sitting hunched forward,  retractions, pulse > 120      moderate 5. RECURRENT SYMPTOM: "Have you had difficulty breathing before?" If Yes, ask: "When was the last time?" and "What happened that time?"      yes 6. CARDIAC HISTORY: "Do you have any history of heart disease?" (e.g., heart attack, angina, bypass surgery, angioplasty)      no 7. LUNG HISTORY: "Do you have any history of lung disease?"  (e.g., pulmonary embolus, asthma, emphysema)     asthma, emphysema, COPD 8. CAUSE: "What do you think is causing the breathing problem?"      N/a 9. OTHER SYMPTOMS: "Do you have any other symptoms? (e.g., dizziness, runny nose, cough, chest pain, fever)     Full body swelling:especially face, rash 10. O2 SATURATION MONITOR:  "Do you use an oxygen saturation monitor (pulse oximeter) at home?" If Yes, ask: "What is your reading (oxygen level) today?" "What is your usual oxygen saturation reading?" (e.g., 95%)       N/a 11. PREGNANCY: "Is there any chance you are pregnant?" "When was your last menstrual period?"       N/a 12. TRAVEL: "Have you traveled out of the country in the last month?" (e.g., travel history, exposures)       N/a  Protocols used: Breathing Difficulty-A-AH

## 2023-02-16 ENCOUNTER — Encounter (HOSPITAL_COMMUNITY): Payer: Self-pay | Admitting: *Deleted

## 2023-02-16 ENCOUNTER — Inpatient Hospital Stay (HOSPITAL_COMMUNITY)
Admission: EM | Admit: 2023-02-16 | Discharge: 2023-02-18 | DRG: 190 | Disposition: A | Discharge status: Home / Self Care | Payer: Medicaid Other | Attending: Family Medicine | Admitting: Family Medicine

## 2023-02-16 ENCOUNTER — Telehealth: Payer: Self-pay

## 2023-02-16 ENCOUNTER — Emergency Department (HOSPITAL_COMMUNITY): Payer: Medicaid Other

## 2023-02-16 ENCOUNTER — Other Ambulatory Visit: Payer: Self-pay

## 2023-02-16 DIAGNOSIS — J441 Chronic obstructive pulmonary disease with (acute) exacerbation: Secondary | ICD-10-CM | POA: Diagnosis not present

## 2023-02-16 DIAGNOSIS — J9601 Acute respiratory failure with hypoxia: Secondary | ICD-10-CM | POA: Diagnosis present

## 2023-02-16 DIAGNOSIS — Z902 Acquired absence of lung [part of]: Secondary | ICD-10-CM

## 2023-02-16 DIAGNOSIS — Z7984 Long term (current) use of oral hypoglycemic drugs: Secondary | ICD-10-CM

## 2023-02-16 DIAGNOSIS — Z8701 Personal history of pneumonia (recurrent): Secondary | ICD-10-CM

## 2023-02-16 DIAGNOSIS — E119 Type 2 diabetes mellitus without complications: Secondary | ICD-10-CM | POA: Diagnosis present

## 2023-02-16 DIAGNOSIS — I5032 Chronic diastolic (congestive) heart failure: Secondary | ICD-10-CM | POA: Diagnosis present

## 2023-02-16 DIAGNOSIS — I252 Old myocardial infarction: Secondary | ICD-10-CM

## 2023-02-16 DIAGNOSIS — J439 Emphysema, unspecified: Secondary | ICD-10-CM | POA: Diagnosis not present

## 2023-02-16 DIAGNOSIS — F1010 Alcohol abuse, uncomplicated: Secondary | ICD-10-CM | POA: Diagnosis present

## 2023-02-16 DIAGNOSIS — Z825 Family history of asthma and other chronic lower respiratory diseases: Secondary | ICD-10-CM

## 2023-02-16 DIAGNOSIS — Z7952 Long term (current) use of systemic steroids: Secondary | ICD-10-CM

## 2023-02-16 DIAGNOSIS — E785 Hyperlipidemia, unspecified: Secondary | ICD-10-CM | POA: Diagnosis present

## 2023-02-16 DIAGNOSIS — Z8616 Personal history of COVID-19: Secondary | ICD-10-CM

## 2023-02-16 DIAGNOSIS — Z8249 Family history of ischemic heart disease and other diseases of the circulatory system: Secondary | ICD-10-CM

## 2023-02-16 DIAGNOSIS — Z79899 Other long term (current) drug therapy: Secondary | ICD-10-CM

## 2023-02-16 DIAGNOSIS — B0223 Postherpetic polyneuropathy: Secondary | ICD-10-CM | POA: Diagnosis present

## 2023-02-16 DIAGNOSIS — Z7951 Long term (current) use of inhaled steroids: Secondary | ICD-10-CM

## 2023-02-16 DIAGNOSIS — I110 Hypertensive heart disease with heart failure: Secondary | ICD-10-CM | POA: Diagnosis present

## 2023-02-16 DIAGNOSIS — R918 Other nonspecific abnormal finding of lung field: Secondary | ICD-10-CM | POA: Diagnosis not present

## 2023-02-16 DIAGNOSIS — Z1152 Encounter for screening for COVID-19: Secondary | ICD-10-CM

## 2023-02-16 DIAGNOSIS — R0682 Tachypnea, not elsewhere classified: Secondary | ICD-10-CM

## 2023-02-16 DIAGNOSIS — K219 Gastro-esophageal reflux disease without esophagitis: Secondary | ICD-10-CM | POA: Diagnosis present

## 2023-02-16 LAB — RESPIRATORY PANEL BY PCR

## 2023-02-16 LAB — I-STAT VENOUS BLOOD GAS, ED
Acid-base deficit: 1 mmol/L (ref 0.0–2.0)
Bicarbonate: 25.4 mmol/L (ref 20.0–28.0)
Calcium, Ion: 1.16 mmol/L (ref 1.15–1.40)
HCT: 43 % (ref 39.0–52.0)
Hemoglobin: 14.6 g/dL (ref 13.0–17.0)
O2 Saturation: 91 %
Potassium: 3.5 mmol/L (ref 3.5–5.1)
Sodium: 139 mmol/L (ref 135–145)
TCO2: 27 mmol/L (ref 22–32)
pCO2, Ven: 47.5 mm[Hg] (ref 44–60)
pH, Ven: 7.335 (ref 7.25–7.43)
pO2, Ven: 66 mm[Hg] — ABNORMAL HIGH (ref 32–45)

## 2023-02-16 LAB — BASIC METABOLIC PANEL
Anion gap: 13 (ref 5–15)
BUN: 17 mg/dL (ref 8–23)
CO2: 25 mmol/L (ref 22–32)
Calcium: 8.9 mg/dL (ref 8.9–10.3)
Chloride: 101 mmol/L (ref 98–111)
Creatinine, Ser: 0.85 mg/dL (ref 0.61–1.24)
GFR, Estimated: >60 mL/min (ref >60)
Glucose, Bld: 74 mg/dL (ref 70–99)
Potassium: 3.7 mmol/L (ref 3.5–5.1)
Sodium: 139 mmol/L (ref 135–145)

## 2023-02-16 LAB — CBC
HCT: 42.9 % (ref 39.0–52.0)
Hemoglobin: 14 g/dL (ref 13.0–17.0)
MCH: 31.1 pg (ref 26.0–34.0)
MCHC: 32.6 g/dL (ref 30.0–36.0)
MCV: 95.3 fL (ref 80.0–100.0)
Platelets: 234 10*3/uL (ref 150–400)
RBC: 4.5 MIL/uL (ref 4.22–5.81)
RDW: 15.9 % — ABNORMAL HIGH (ref 11.5–15.5)
WBC: 12.6 10*3/uL — ABNORMAL HIGH (ref 4.0–10.5)
nRBC: 0 % (ref 0.0–0.2)

## 2023-02-16 LAB — RESP PANEL BY RT-PCR (RSV, FLU A&B, COVID)  RVPGX2
Influenza A by PCR: NEGATIVE
Influenza B by PCR: NEGATIVE
Resp Syncytial Virus by PCR: NEGATIVE
SARS Coronavirus 2 by RT PCR: NEGATIVE

## 2023-02-16 LAB — HEMOGLOBIN A1C
Hgb A1c MFr Bld: 5.9 % — ABNORMAL HIGH (ref 4.8–5.6)
Mean Plasma Glucose: 122.63 mg/dL

## 2023-02-16 LAB — CBG MONITORING, ED
Glucose-Capillary: 343 mg/dL — ABNORMAL HIGH (ref 70–99)
Glucose-Capillary: 199 mg/dL — ABNORMAL HIGH (ref 70–99)
Glucose-Capillary: 147 mg/dL — ABNORMAL HIGH (ref 70–99)
Glucose-Capillary: 116 mg/dL — ABNORMAL HIGH (ref 70–99)

## 2023-02-16 LAB — BRAIN NATRIURETIC PEPTIDE: B Natriuretic Peptide: 46.2 pg/mL (ref 0.0–100.0)

## 2023-02-16 MED ORDER — ATORVASTATIN CALCIUM 10 MG PO TABS
10.0000 mg | ORAL_TABLET | Freq: Every day | ORAL | Status: DC
  Administered 2023-02-16 – 2023-02-18 (×3): 10 mg via ORAL
  Filled 2023-02-16 (×3): qty 1

## 2023-02-16 MED ORDER — METOPROLOL SUCCINATE ER 50 MG PO TB24
50.0000 mg | ORAL_TABLET | Freq: Every day | ORAL | Status: DC
  Administered 2023-02-16 – 2023-02-18 (×3): 50 mg via ORAL
  Filled 2023-02-16 (×2): qty 2
  Filled 2023-02-16: qty 1

## 2023-02-16 MED ORDER — IPRATROPIUM-ALBUTEROL 0.5-2.5 (3) MG/3ML IN SOLN
3.0000 mL | RESPIRATORY_TRACT | Status: DC | PRN
  Administered 2023-02-16: 3 mL via RESPIRATORY_TRACT
  Filled 2023-02-16: qty 3

## 2023-02-16 MED ORDER — ENOXAPARIN SODIUM 40 MG/0.4ML IJ SOSY
40.0000 mg | PREFILLED_SYRINGE | Freq: Every day | INTRAMUSCULAR | Status: DC
  Administered 2023-02-16 – 2023-02-18 (×3): 40 mg via SUBCUTANEOUS
  Filled 2023-02-16 (×3): qty 0.4

## 2023-02-16 MED ORDER — UMECLIDINIUM BROMIDE 62.5 MCG/ACT IN AEPB
1.0000 | INHALATION_SPRAY | Freq: Every day | RESPIRATORY_TRACT | Status: DC
  Administered 2023-02-16 – 2023-02-18 (×3): 1 via RESPIRATORY_TRACT
  Filled 2023-02-16 (×2): qty 7

## 2023-02-16 MED ORDER — PREDNISONE 20 MG PO TABS
40.0000 mg | ORAL_TABLET | Freq: Every day | ORAL | Status: DC
  Administered 2023-02-16 – 2023-02-18 (×3): 40 mg via ORAL
  Filled 2023-02-16 (×3): qty 2

## 2023-02-16 MED ORDER — INSULIN ASPART 100 UNIT/ML IJ SOLN
0.0000 [IU] | Freq: Three times a day (TID) | INTRAMUSCULAR | Status: DC
  Administered 2023-02-16: 15 [IU] via SUBCUTANEOUS
  Administered 2023-02-16: 3 [IU] via SUBCUTANEOUS
  Administered 2023-02-16: 2 [IU] via SUBCUTANEOUS
  Administered 2023-02-17 (×2): 3 [IU] via SUBCUTANEOUS

## 2023-02-16 MED ORDER — ADULT MULTIVITAMIN W/MINERALS CH
1.0000 | ORAL_TABLET | Freq: Every day | ORAL | Status: DC
  Administered 2023-02-16 – 2023-02-18 (×3): 1 via ORAL
  Filled 2023-02-16 (×3): qty 1

## 2023-02-16 MED ORDER — LOSARTAN POTASSIUM 50 MG PO TABS
100.0000 mg | ORAL_TABLET | Freq: Every day | ORAL | Status: DC
  Administered 2023-02-16 – 2023-02-18 (×3): 100 mg via ORAL
  Filled 2023-02-16 (×3): qty 2

## 2023-02-16 MED ORDER — FUROSEMIDE 40 MG PO TABS
40.0000 mg | ORAL_TABLET | Freq: Every day | ORAL | Status: DC
  Administered 2023-02-16 – 2023-02-18 (×3): 40 mg via ORAL
  Filled 2023-02-16: qty 1
  Filled 2023-02-16 (×2): qty 2

## 2023-02-16 MED ORDER — ALBUTEROL SULFATE (2.5 MG/3ML) 0.083% IN NEBU
10.0000 mg/h | INHALATION_SOLUTION | RESPIRATORY_TRACT | Status: DC
  Administered 2023-02-16: 10 mg/h via RESPIRATORY_TRACT
  Filled 2023-02-16: qty 3

## 2023-02-16 MED ORDER — IPRATROPIUM BROMIDE 0.02 % IN SOLN
0.5000 mg | Freq: Once | RESPIRATORY_TRACT | Status: AC
  Administered 2023-02-16: 0.5 mg via RESPIRATORY_TRACT
  Filled 2023-02-16: qty 2.5

## 2023-02-16 MED ORDER — INSULIN ASPART 100 UNIT/ML IJ SOLN: 0.0000 [IU] | Freq: Every day | INTRAMUSCULAR | Status: DC

## 2023-02-16 MED ORDER — THIAMINE MONONITRATE 100 MG PO TABS
100.0000 mg | ORAL_TABLET | Freq: Every day | ORAL | Status: DC
  Administered 2023-02-16 – 2023-02-18 (×3): 100 mg via ORAL
  Filled 2023-02-16 (×3): qty 1

## 2023-02-16 MED ORDER — SODIUM CHLORIDE 0.9 % IV SOLN
100.0000 mg | Freq: Two times a day (BID) | INTRAVENOUS | Status: DC
  Administered 2023-02-16: 100 mg via INTRAVENOUS
  Filled 2023-02-16: qty 100

## 2023-02-16 MED ORDER — LORAZEPAM 2 MG/ML IJ SOLN
1.0000 mg | Freq: Once | INTRAMUSCULAR | Status: AC
Start: 2023-02-16 — End: 2023-02-16
  Administered 2023-02-16: 1 mg via INTRAVENOUS
  Filled 2023-02-16: qty 1

## 2023-02-16 MED ORDER — MOMETASONE FURO-FORMOTEROL FUM 200-5 MCG/ACT IN AERO
2.0000 | INHALATION_SPRAY | Freq: Two times a day (BID) | RESPIRATORY_TRACT | Status: DC
  Administered 2023-02-16 – 2023-02-18 (×4): 2 via RESPIRATORY_TRACT
  Filled 2023-02-16 (×2): qty 8.8

## 2023-02-16 MED ORDER — IPRATROPIUM-ALBUTEROL 0.5-2.5 (3) MG/3ML IN SOLN
3.0000 mL | Freq: Four times a day (QID) | RESPIRATORY_TRACT | Status: DC
  Administered 2023-02-16: 3 mL via RESPIRATORY_TRACT
  Filled 2023-02-16 (×2): qty 3

## 2023-02-16 MED ORDER — THIAMINE HCL 100 MG/ML IJ SOLN
100.0000 mg | Freq: Every day | INTRAMUSCULAR | Status: DC
  Filled 2023-02-16: qty 2

## 2023-02-16 MED ORDER — ENOXAPARIN SODIUM 40 MG/0.4ML IJ SOSY
40.0000 mg | PREFILLED_SYRINGE | INTRAMUSCULAR | Status: DC
Start: 2023-02-16 — End: 2023-02-16

## 2023-02-16 MED ORDER — ALBUTEROL SULFATE (2.5 MG/3ML) 0.083% IN NEBU: 2.5000 mg | INHALATION_SOLUTION | RESPIRATORY_TRACT | Status: DC | PRN

## 2023-02-16 MED ORDER — FOLIC ACID 1 MG PO TABS
1.0000 mg | ORAL_TABLET | Freq: Every day | ORAL | Status: DC
  Administered 2023-02-16 – 2023-02-18 (×3): 1 mg via ORAL
  Filled 2023-02-16 (×3): qty 1

## 2023-02-16 MED ORDER — DOXYCYCLINE HYCLATE 100 MG PO TABS
100.0000 mg | ORAL_TABLET | Freq: Two times a day (BID) | ORAL | Status: DC
  Administered 2023-02-16 – 2023-02-18 (×5): 100 mg via ORAL
  Filled 2023-02-16 (×5): qty 1

## 2023-02-16 MED ORDER — PANTOPRAZOLE SODIUM 40 MG PO TBEC
40.0000 mg | DELAYED_RELEASE_TABLET | Freq: Every day | ORAL | Status: DC
  Administered 2023-02-16 – 2023-02-18 (×3): 40 mg via ORAL
  Filled 2023-02-16 (×3): qty 1

## 2023-02-16 NOTE — Evaluation (Signed)
Physical Therapy Evaluation Patient Details Name: James [name] MRN: [id number] DOB: 1960-12-24 Today's Date: 02/16/2023  History of Present Illness  63 y.o. male presents to [facility] 02/16/23 with SOB and worsening cough. Respiratory failure likely related to acute on chronic COPD and hypoxia.  PMHx:  recurrent PNA, COPD on 2L, lobectomy d/t cancer, Covid, DM, HTN, ETOH abuse.   Clinical Impression  Pt in chair upon arrival and agreeable to PT eval. Prior to admit, pt was independent with no AD. In today's session, pt was able to stand with supervision and ambulate ~250 ft with CGA. Pt was at 95% SpO2 on RA while ambulating with slightly increased WOB. Pt reports being at functional mobility baseline. Pt presents to therapy session with decreased balance and mobility. Pt would benefit from acute skilled PT to address functional impairments and ensure safety with stair negotiation. Pt will likely only need one more acute PT session. Anticipating pt will need no follow-up PT as pt reports being at functional mobility baseline. Acute PT to follow.          If plan is discharge home, recommend the following: A little help with walking and/or transfers;A little help with bathing/dressing/bathroom;Assist for transportation;Help with stairs or ramp for entrance   Can travel by private vehicle    Yes    Equipment Recommendations None recommended by PT     Functional Status Assessment Patient has had a recent decline in their functional status and demonstrates the ability to make significant improvements in function in a reasonable and predictable amount of time.     Precautions / Restrictions Precautions Precautions: Fall Restrictions Weight Bearing Restrictions Per Provider Order: No      Mobility  Bed Mobility    General bed mobility comments: in chair upon arrival    Transfers Overall transfer level: Needs assistance Equipment used: None Transfers: Sit to/from Stand Sit to Stand:  Supervision      General transfer comment: supervision for safety    Ambulation/Gait Ambulation/Gait assistance: Contact guard assist Gait Distance (Feet): 250 Feet Assistive device: None Gait Pattern/deviations: Step-through pattern, Wide base of support Gait velocity: decr     General Gait Details: wide BOS, slightly unsteady with CGA for safety     Balance Overall balance assessment: Needs assistance, Mild deficits observed, not formally tested Sitting-balance support: No upper extremity supported, Feet supported Sitting balance-Leahy Scale: Good     Standing balance support: No upper extremity supported Standing balance-Leahy Scale: Fair       Pertinent Vitals/Pain Pain Assessment Pain Assessment: No/denies pain    Home Living Family/patient expects to be discharged to:: Private residence Living Arrangements: Alone Available Help at Discharge: Family;Available PRN/intermittently Type of Home: House Home Access: Stairs to enter Entrance Stairs-Rails: None Entrance Stairs-Number of Steps: 2   Home Layout: One level Home Equipment: None Additional Comments: wears 2L O2 baseline    Prior Function Prior Level of Function : Independent/Modified Independent        Mobility Comments: no AD use ADLs Comments: ind with ADLs; utilizes transportation services     Extremity/Trunk Assessment   Upper Extremity Assessment Upper Extremity Assessment: Defer to OT evaluation (L hand tremors when reaching)    Lower Extremity Assessment Lower Extremity Assessment: Overall WFL for tasks assessed    Cervical / Trunk Assessment Cervical / Trunk Assessment: Normal  Communication   Communication Communication: Impaired Factors Affecting Communication: Hearing impaired    Cognition Arousal: Alert Behavior During Therapy: [facility] for tasks assessed/performed  PT - Cognitive impairments: No apparent impairments    PT - Cognition Comments: A&Ox4 Following commands:  Intact       Cueing Cueing Techniques: Verbal cues     General Comments General comments (skin integrity, edema, etc.): VSS on RA, SpO2 >95% on RA while ambulating. Appears SOB with increased RR     PT Assessment Patient needs continued PT services  PT Problem List Decreased balance;Decreased mobility;Decreased activity tolerance       PT Treatment Interventions DME instruction;Gait training;Stair training;Therapeutic activities;Functional mobility training;Therapeutic exercise;Balance training;Neuromuscular re-education;Patient/family education    PT Goals (Current goals can be found in the Care Plan section)  Acute Rehab PT Goals Patient Stated Goal: to go home PT Goal Formulation: With patient Time For Goal Achievement: 03/02/23 Potential to Achieve Goals: Good    Frequency Min 1X/week        AM-PAC PT "6 Clicks" Mobility  Outcome Measure Help needed turning from your back to your side while in a flat bed without using bedrails?: None Help needed moving from lying on your back to sitting on the side of a flat bed without using bedrails?: None Help needed moving to and from a bed to a chair (including a wheelchair)?: A Little Help needed standing up from a chair using your arms (e.g., wheelchair or bedside chair)?: A Little Help needed to walk in hospital room?: A Little Help needed climbing 3-5 steps with a railing? : A Little 6 Click Score: 20    End of Session Equipment Utilized During Treatment: Gait belt Activity Tolerance: Patient tolerated treatment well Patient left: in chair;with call bell/phone within reach Nurse Communication: Mobility status PT Visit Diagnosis: Other abnormalities of gait and mobility (R26.89)    Time: 1610-9604 PT Time Calculation (min) (ACUTE ONLY): 14 min   Charges:   PT Evaluation $PT Eval Low Complexity: 1 Low   PT General Charges $$ ACUTE PT VISIT: 1 Visit         Hilton Cork, PT, DPT Secure Chat Preferred  Rehab Office  [phone]   Arturo Morton Brion Aliment 02/16/2023, 10:09 AM

## 2023-02-16 NOTE — ED Triage Notes (Signed)
Pt arrives via [facility] from home w c/o SOB Per report, the pt has hx of COPD and lobectomy d/t cancer. Initial RA sats 88%. SOB awoke him from his sleep. En route duoneb x2, 125 solumedrol, 2gm MG, 96% on nebs. IV established in the left AC. 120/56, hr 96

## 2023-02-16 NOTE — Progress Notes (Signed)
CSW added substance abuse resources to patient's AVS.  Edwin Dada, MSW, LCSW Transitions of Care  Clinical Social Worker II [phone]

## 2023-02-16 NOTE — ED Triage Notes (Signed)
Pt reports shortness of breath that woke him up from his sleep. He normally wears 2 liters O2 and did have his oxygen on. He denies recent illness, cough or fevers.

## 2023-02-16 NOTE — ED Provider Notes (Signed)
[name] AT [facility] Provider Note   CSN: [id number] Arrival date & time: 02/16/23  0006     History  Chief Complaint  Patient presents with   Shortness of Breath    James [name] is a 63 y.o. male.  63 yo M w/ h/o COPD here self described exacerbation.  States that*last night.  He had worsening productive cough throughout the day yesterday but shortness of breath and cough got worse again last night.  Home medications to help so he called EMS.  He was hypoxic on his baseline 2 L so they increased his oxygen and brought him here.  Given Solu-Medrol magnesium and route.  Gave 2 DuoNeb's.  But states he feels little bit better now.  No lower extremity swelling.  No fever.  No other associated symptoms.   Shortness of Breath      Home Medications Prior to Admission medications   Medication Sig Start Date End Date Taking? Authorizing Provider  albuterol (PROVENTIL) (2.5 MG/3ML) 0.083% nebulizer solution Inhale 3 mLs (2.5 mg total) by nebulization every 4 (four) hours as needed for wheezing or shortness of breath. Patient taking differently: Take 2.5 mg by nebulization at bedtime. 09/28/22  Yes [name], MD  albuterol (VENTOLIN HFA) 108 (90 Base) MCG/ACT inhaler Inhale 2 puffs into the lungs every 6 (six) hours as needed for wheezing or shortness of breath. Patient taking differently: Inhale 2-3 puffs into the lungs in the morning and at bedtime. 09/28/22   [name], MD  atorvastatin (LIPITOR) 10 MG tablet Take 1 tablet (10 mg total) by mouth daily. 09/28/22   [name], MD  Budeson-Glycopyrrol-Formoterol (BREZTRI AEROSPHERE) 160-9-4.8 MCG/ACT AERO Inhale 2 puffs into the lungs in the morning and at bedtime. 09/28/22   [name], MD  ferrous sulfate 325 (65 FE) MG tablet Take 1 tablet (325 mg total) by mouth daily with breakfast. 12/28/22   Marcine Matar, MD  folic acid (FOLVITE) 1 MG tablet Take 1 tablet (1 mg  total) by mouth daily. 09/28/22   [name], MD  furosemide (LASIX) 40 MG tablet Take 1 tablet (40 mg total) by mouth daily. 09/28/22   [name], MD  glipiZIDE (GLUCOTROL XL) 5 MG 24 hr tablet Take 1 tablet (5 mg total) by mouth daily with breakfast. 09/28/22   [name], MD  losartan (COZAAR) 100 MG tablet Take 1 tablet (100 mg total) by mouth daily. 06/09/22   [name], MD  metoprolol succinate (TOPROL-XL) 50 MG 24 hr tablet Take 1 tablet (50 mg total) by mouth daily. Take with or immediately following a meal. 06/09/22   [name], MD  nystatin cream (MYCOSTATIN) Apply to affected areas 3 (three) times daily. Patient taking differently: Apply 1 Application topically as needed (irritation). 08/17/22   [name], MD  pantoprazole (PROTONIX) 40 MG tablet Take 1 tablet (40 mg total) by mouth daily. 08/17/22   [name], MD  predniSONE (DELTASONE) 10 MG tablet Take 1 tablet (10 mg total) by mouth daily with breakfast. 02/05/23   Marcine Matar, MD  pregabalin (LYRICA) 100 MG capsule Take 1 capsule (100 mg total) by mouth 2 (two) times daily. 02/02/23   Marcine Matar, MD      Allergies    Patient has no known allergies.    Review of Systems   Review of Systems  Respiratory:  Positive for shortness of breath.  Physical Exam Updated Vital Signs BP 118/68   Pulse 100   Temp (!) 97 F (36.1 C) (Axillary)   Resp 17   SpO2 99%  Physical Exam Vitals and nursing note reviewed.  Constitutional:      Appearance: He is well-developed. He is ill-appearing.  HENT:     Head: Normocephalic and atraumatic.  Cardiovascular:     Rate and Rhythm: Tachycardia present.  Pulmonary:     Effort: Tachypnea, accessory muscle usage and respiratory distress present.     Breath sounds: Decreased breath sounds and wheezing present.  Abdominal:     General: There is no distension.  Musculoskeletal:        General: Normal range of motion.     Cervical  back: Normal range of motion.     Right lower leg: No edema.     Left lower leg: No edema.  Neurological:     Mental Status: He is alert.     ED Results / Procedures / Treatments   Labs (all labs ordered are listed, but only abnormal results are displayed) Labs Reviewed  CBC - Abnormal; Notable for the following components:      Result Value   WBC 12.6 (*)    RDW 15.9 (*)    All other components within normal limits  I-STAT VENOUS BLOOD GAS, ED - Abnormal; Notable for the following components:   pO2, Ven 66 (*)    All other components within normal limits  BASIC METABOLIC PANEL  BRAIN NATRIURETIC PEPTIDE    EKG EKG Interpretation Date/Time:  Wednesday February 16 2023 00:14:26 EST Ventricular Rate:  98 PR Interval:  198 QRS Duration:  101 QT Interval:  380 QTC Calculation: 486 R Axis:   79  Text Interpretation: Sinus rhythm Low voltage, precordial leads Probable anteroseptal infarct, old Confirmed by Marily Memos [phone]) on 02/16/2023 3:09:45 AM  Radiology DG Chest 2 View Result Date: 02/16/2023 CLINICAL DATA:  COPD EXAM: CHEST - 2 VIEW COMPARISON:  01/02/2023 FINDINGS: Heart mediastinal contours are within normal limits. Peribronchial thickening and hyperinflation. No confluent opacities or effusions. No acute bony abnormality. IMPRESSION: Emphysema.  Probable chronic bronchitic changes. No active disease. Electronically Signed   By: Charlett Nose M.D.   On: 02/16/2023 00:48    Procedures .Critical Care  Performed by: Marily Memos, MD Authorized by: Marily Memos, MD   Critical care provider statement:    Critical care time (minutes):  30   Critical care was necessary to treat or prevent imminent or life-threatening deterioration of the following conditions:  Respiratory failure   Critical care was time spent personally by me on the following activities:  Development of treatment plan with patient or surrogate, discussions with consultants, evaluation of patient's  response to treatment, examination of patient, ordering and review of laboratory studies, ordering and review of radiographic studies, ordering and performing treatments and interventions, pulse oximetry, re-evaluation of patient's condition and review of old charts     Medications Ordered in ED Medications  albuterol (PROVENTIL) (2.5 MG/3ML) 0.083% nebulizer solution (10 mg/hr Nebulization New Bag/Given 02/16/23 0140)  doxycycline (VIBRAMYCIN) 100 mg in sodium chloride 0.9 % 250 mL IVPB (0 mg Intravenous Stopped 02/16/23 0340)  enoxaparin (LOVENOX) injection 40 mg (has no administration in time range)  ipratropium-albuterol (DUONEB) 0.5-2.5 (3) MG/3ML nebulizer solution 3 mL (3 mLs Nebulization Given 02/16/23 0433)  ipratropium (ATROVENT) nebulizer solution 0.5 mg (0.5 mg Nebulization Given 02/16/23 0140)    ED Course/ Medical Decision Making/ A&P Clinical Course  as of 02/16/23 0551  Wed Feb 16, 2023  0130 EKG 12-Lead [IC]  0134 DG Chest 2 View [IC]  0134 EKG 12-Lead [IC]    Clinical Course User Index [IC] Ladona Mow, Student-PA                                 Medical Decision Making Amount and/or Complexity of Data Reviewed Labs: ordered. Radiology: ordered.  Risk Prescription drug management. Decision regarding hospitalization.   Respiratory failure likely related to acute on chronic COPD exacerbation and hypoxia.  Continuous albuterol given his lungs did open up but he remains tachypneic, pursed lip breathing and increased oxygen requirement.  Antibiotics given secondary to possible atypical infection causing COPD exacerbation.  COVID test ordered.  Discussed with Dr. Margo Aye for admission.  No indication for BiPAP at this time.  Will use a Venturi mask per patient's comfort.   Final Clinical Impression(s) / ED Diagnoses Final diagnoses:  COPD exacerbation (HCC)  Tachypnea    Rx / DC Orders ED Discharge Orders     None         Turrell Severt, Barbara Cower, MD 02/16/23  [phone]

## 2023-02-16 NOTE — Telephone Encounter (Signed)
Copied from CRM [id number]. Topic: General - Other >> Feb 15, 2023  9:15 AM Phill Myron wrote: Carney Bern with [employer] [facility] Please call with patients recent visit, upcoming visits, or follow up visits where oxygen may be discussed

## 2023-02-16 NOTE — H&P (Addendum)
History and Physical    [name] QMV:[id number] DOB: 07-23-1960 DOA: 02/16/2023 DOS: the patient was seen and examined on 02/16/2023 PCP: Marcine Matar, MD  Patient coming from: Home  Chief Complaint:  Chief Complaint  Patient presents with   Shortness of Breath   HPI: James [name] is a 63 y.o. male with medical history significant of COPD, chronic diastolic heart failure, hypertension, type 2 diabetes presenting with shortness of breath with exertion and worsening productive cough.  Patient reports that he has been noting worsening shortness of breath over the past few days and began having a worsening cough yesterday.  His cough has been productive of yellowish colored sputum.  He has been adherent with his home Breztri and albuterol inhalers and use these yesterday without any improvement in his symptoms.  States that he does have a significant smoking history but he quit smoking around 2014.  He denies any fevers, chills, nausea, vomiting, chest pain, abdominal pain, diarrhea, constipation.  He is not on any oxygen at home.  Denies any recent sick contacts.  He called EMS after realizing that his home inhalers were not helping.  EMS found him to be hypoxic and thus brought him to the ED.  He was given Solu-Medrol and magnesium and route along with DuoNebs x 2.  ED course: Initial vital signs stable.  He did develop worsening O2 sats requiring transition to Venturi mask up to 10 L overnight.  CBC with mild leukocytosis.  BMP unremarkable.  BNP normal.  VBG without hypercarbia and with normal pH.  Chest x-ray showing emphysema and chronic bronchitic changes, no obvious focal consolidation noted.  He was provided with continuous albuterol and started on doxycycline for possible atypical infection.  [facility] hospitalist asked to evaluate patient for admission.  Review of Systems: As mentioned in the history of present illness. All other systems reviewed and are negative. Past  Medical History:  Diagnosis Date   Acute on chronic respiratory failure with hypoxia (HCC) 06/08/2013   Acute on chronic respiratory failure with hypoxia and hypercapnia (HCC) 06/08/2013   AKI (acute kidney injury) (HCC) 07/12/2022   Alcohol use disorder 11/23/2018   CAP (community acquired pneumonia) 05/17/2018   See admit 05/17/18 ? rml  with   covid pcr neg - rx augmentin > f/u cxr in 4-6 weeks is fine unless condition declines    Community acquired pneumonia 07/12/2021   Community acquired pneumonia of right lower lobe of lung 05/17/2018   See admit 05/17/18 ? 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   Recurrent pneumonia 02/20/2021   Tick bite 06/03/2018   Past Surgical History:  Procedure Laterality Date   ESOPHAGEAL MANOMETRY N/A 09/15/2022   Procedure: ESOPHAGEAL MANOMETRY (EM);  Surgeon: Hilarie Fredrickson, MD;  Location: [facility];  Service: Gastroenterology;  Laterality: N/A;   LEFT HEART CATH AND CORONARY ANGIOGRAPHY N/A 12/07/2021   Procedure: LEFT HEART CATH AND CORONARY ANGIOGRAPHY;  Surgeon: Runell Gess, MD;  Location:  [facility];  Service: Cardiovascular;  Laterality: N/A;   LUNG REMOVAL, PARTIAL     VIDEO ASSISTED THORACOSCOPY (VATS)/THOROCOTOMY Left 06/11/2013   Procedure: VIDEO ASSISTED THORACOSCOPY (VATS)/THOROCOTOMY;  Surgeon: Kerin Perna, MD;  Location: [facility] OR;  Service: Thoracic;  Laterality: Left;   VIDEO BRONCHOSCOPY N/A 06/11/2013   Procedure: VIDEO BRONCHOSCOPY;  Surgeon: Kerin Perna, MD;  Location: [facility] OR;  Service: Thoracic;  Laterality: N/A;   VIDEO BRONCHOSCOPY N/A 08/10/2018   Procedure: VIDEO BRONCHOSCOPY WITH FLUORO;  Surgeon: Lorin Glass, MD;  Location: [facility] ENDOSCOPY;  Service: Endoscopy;  Laterality: N/A;   Social History:  reports that he quit smoking about 11 years ago. His smoking use included cigarettes. He started smoking about 51 years ago. He has a 60 pack-year smoking history. He has quit using smokeless tobacco.  His smokeless tobacco use included chew. He reports current alcohol use of about 4.0 standard drinks of alcohol per week. He reports that he does not currently use drugs after having used the following drugs: Marijuana.  No Known Allergies  Family History  Problem Relation Age of Onset   Heart disease Father    COPD Sister     Prior to Admission medications   Medication Sig Start Date End Date Taking? Authorizing Provider  albuterol (PROVENTIL) (2.5 MG/3ML) 0.083% nebulizer solution Inhale 3 mLs (2.5 mg total) by nebulization every 4 (four) hours as needed for wheezing or shortness of breath. Patient taking differently: Take 2.5 mg by nebulization at bedtime. 09/28/22  Yes [name], MD  albuterol (VENTOLIN HFA) 108 (90 Base) MCG/ACT inhaler Inhale 2 puffs into the lungs every 6 (six) hours as needed for wheezing or shortness of breath. Patient taking differently: Inhale 2-3 puffs into the lungs in the morning and at bedtime. 09/28/22  Yes [name], MD  atorvastatin (LIPITOR) 10 MG tablet Take 1 tablet (10 mg total) by mouth daily. 09/28/22  Yes [name], MD  Budeson-Glycopyrrol-Formoterol (BREZTRI AEROSPHERE) 160-9-4.8 MCG/ACT AERO Inhale 2 puffs into the lungs in the morning and at bedtime. 09/28/22  Yes [name], MD  folic acid (FOLVITE) 1 MG tablet Take 1 tablet (1 mg total) by mouth daily. 09/28/22  Yes [name], MD  furosemide (LASIX) 40 MG tablet Take 1 tablet (40 mg total) by mouth daily.  09/28/22  Yes [name], MD  glipiZIDE (GLUCOTROL XL) 5 MG 24 hr tablet Take 1 tablet (5 mg total) by mouth daily with breakfast. 09/28/22  Yes [name], MD  losartan (COZAAR) 100 MG tablet Take 1 tablet (100 mg total) by mouth daily. 06/09/22  Yes [name], MD  metoprolol succinate (TOPROL-XL) 50 MG 24 hr tablet Take 1 tablet (50 mg total) by mouth daily. Take with or immediately following a meal. 06/09/22  Yes [name], MD  nystatin cream (MYCOSTATIN) Apply to affected areas 3 (three) times daily. Patient taking differently: Apply 1 Application topically as needed (irritation). 08/17/22  Yes [name], MD  pantoprazole (PROTONIX) 40 MG tablet Take 1 tablet (40 mg total) by mouth daily. 08/17/22  Yes [name], MD  predniSONE (DELTASONE) 10 MG tablet Take 1 tablet (10 mg total) by mouth daily with breakfast. 02/05/23  Yes Marcine Matar, MD  pregabalin (LYRICA) 100 MG capsule Take 1 capsule (100 mg total) by mouth 2 (two) times daily. Patient taking differently: Take 100 mg by mouth daily. 02/02/23  Yes Marcine Matar,  MD    Physical Exam: Vitals:   02/16/23 0323 02/16/23 0328 02/16/23 0433 02/16/23 0500  BP:    121/61  Pulse: 88 100  (!) 103  Resp: 18 17  15   Temp:      TempSrc:      SpO2: (!) 88% 99% 99% 98%   Physical Exam Constitutional:      Appearance: He is well-developed. He is obese. He is not ill-appearing.  HENT:     Head: Normocephalic and atraumatic.     Mouth/Throat:     Mouth: Mucous membranes are moist.     Pharynx: Oropharynx is clear. No oropharyngeal exudate.  Eyes:     Extraocular Movements: Extraocular movements intact.     Pupils: Pupils are equal, round, and reactive to light.  Cardiovascular:     Rate and Rhythm: Normal rate and regular rhythm.     Pulses: Normal pulses.     Heart sounds: Normal heart sounds. No murmur heard.    No friction rub. No gallop.  Pulmonary:     Effort: Pulmonary effort is  normal. No tachypnea, accessory muscle usage or respiratory distress.     Breath sounds: Examination of the right-lower field reveals decreased breath sounds. Examination of the left-lower field reveals decreased breath sounds. Decreased breath sounds present. No wheezing, rhonchi or rales.  Abdominal:     General: Bowel sounds are normal.     Palpations: Abdomen is soft.     Tenderness: There is no abdominal tenderness. There is no guarding or rebound.  Musculoskeletal:        General: Normal range of motion.     Comments: Trace pitting edema in lower extremities bilaterally  Skin:    General: Skin is warm and dry.  Neurological:     General: No focal deficit present.     Mental Status: He is alert and oriented to person, place, and time.  Psychiatric:        Mood and Affect: Mood normal.        Behavior: Behavior normal.     Data Reviewed:  There are no new results to review at this time.     Latest Ref Rng & Units 02/16/2023    1:35 AM 02/16/2023   12:12 AM 01/02/2023    6:02 AM  CBC  WBC 4.0 - 10.5 K/uL  12.6  18.8   Hemoglobin 13.0 - 17.0 g/dL 16.1  09.6  04.5   Hematocrit 39.0 - 52.0 % 43.0  42.9  41.1   Platelets 150 - 400 K/uL  234  235       Latest Ref Rng & Units 02/16/2023    1:35 AM 02/16/2023   12:12 AM 01/02/2023    6:02 AM  CMP  Glucose 70 - 99 mg/dL  74  409   BUN 8 - 23 mg/dL  17  17   Creatinine 8.11 - 1.24 mg/dL  9.14  7.82   Sodium 956 - 145 mmol/L 139  139  145   Potassium 3.5 - 5.1 mmol/L 3.5  3.7  3.5   Chloride 98 - 111 mmol/L  101  112   CO2 22 - 32 mmol/L  25  24   Calcium 8.9 - 10.3 mg/dL  8.9  8.0   Total Protein 6.5 - 8.1 g/dL   4.9   Total Bilirubin <1.2 mg/dL   0.5   Alkaline Phos 38 - 126 U/L   36   AST 15 - 41 U/L  28   ALT 0 - 44 U/L   25    DG Chest 2 View Result Date: 02/16/2023 CLINICAL DATA:  COPD EXAM: CHEST - 2 VIEW COMPARISON:  01/02/2023 FINDINGS: Heart mediastinal contours are within normal limits. Peribronchial  thickening and hyperinflation. No confluent opacities or effusions. No acute bony abnormality. IMPRESSION: Emphysema.  Probable chronic bronchitic changes. No active disease. Electronically Signed   By: Charlett Nose M.D.   On: 02/16/2023 00:48     Assessment and Plan: No notes have been filed under this hospital service. Service: Hospitalist  Acute COPD exacerbation Patient presenting with worsening dyspnea on exertion and productive cough with initial examination consistent with COPD exacerbation.  He did initially require 10 L oxygen via Venturi mask but appears to be improved this morning.  VBG without hypercarbia and chest x-ray without any acute infection.  On my exam, he is saturating well on room air (took of Venturi mask at some point overnight for sleep comfort) and oxygen saturations did not decrease despite conversation.  No wheezing on exam, though I did note diminished air entry bilaterally.  He is on [place] and as needed albuterol at home.  He follows [facility] as an outpatient. -Elwin Sleight and Phillip Heal in place of home Breztri -DuoNebs every 4 hours as needed -Continue doxycycline for 5 days -Prednisone 40 mg daily for 4 more days -Follow-up respiratory panel -Supplemental oxygen as needed to maintain O2 sats greater than 88% -Flutter valve, incentive spirometry -PT/OT eval, check ambulatory O2 sats with and without oxygen -Lovenox for DVT prophylaxis -Admit to inpatient  Chronic diastolic CHF -Last ECHO 11/2022 with recovered EF to 55-60%, no RWMA, normal diastolic paramaters (noted to have G1DD in the past) -on lasix 40mg  daily, losartan 100mg  daily, and toprol-xl 50mg  daily at home -He is euvolemic on exam and BNP normal -Resume home Lasix, losartan, Toprol-XL  HTN -On losartan 100 mg daily and Toprol-XL 50 mg daily at home -BP ranging in the 120s-150s/80s -Resume home losartan and Toprol-XL  T2DM -A1c 6.2% in 12/2022 -on glipizide 5mg  daily at  home -CBG this a.m. at 343, likely elevated in the setting of receiving steroids for COPD exacerbation -Started on moderate SSI -Carb modified diet -trend CBGs, goal 140-180  Alcohol use disorder Patient is a daily alcohol user, last drink yesterday. Will place on CIWA without ativan for now. -CIWA without ativan -TOC consulted for substance use resources  Hyperlipidemia -Resume home Lipitor 10 mg daily  GERD -Resume home Protonix 40 mg daily    Advance Care Planning:   Code Status: Full Code   Consults: none  Family Communication: no family at bedside  Severity of Illness: The appropriate patient status for this patient is INPATIENT. Inpatient status is judged to be reasonable and necessary in order to provide the required intensity of service to ensure the patient's safety. The patient's presenting symptoms, physical exam findings, and initial radiographic and laboratory data in the context of their chronic comorbidities is felt to place them at high risk for further clinical deterioration. Furthermore, it is not anticipated that the patient will be medically stable for discharge from the hospital within 2 midnights of admission.   * I certify that at the point of admission it is my clinical judgment that the patient will require inpatient hospital care spanning beyond 2 midnights from the point of admission due to high intensity of service, high risk for further deterioration and high frequency of surveillance required.*  Portions of this note  were generated with Scientist, clinical (histocompatibility and immunogenetics). Dictation errors may occur despite best attempts at proofreading.   Author: Briscoe Burns, MD 02/16/2023 6:53 AM  For on call review [email or web address].

## 2023-02-16 NOTE — Discharge Instructions (Signed)

## 2023-02-16 NOTE — ED Notes (Signed)
RN rounding on patient. Patient satting 85% on 3L venturi mask. Adjusted venturi mask up to 10L, patient satting 88%. RT notified. RT at bedside.

## 2023-02-17 DIAGNOSIS — Z902 Acquired absence of lung [part of]: Secondary | ICD-10-CM | POA: Diagnosis not present

## 2023-02-17 DIAGNOSIS — R0602 Shortness of breath: Secondary | ICD-10-CM | POA: Diagnosis not present

## 2023-02-17 DIAGNOSIS — Z7951 Long term (current) use of inhaled steroids: Secondary | ICD-10-CM | POA: Diagnosis not present

## 2023-02-17 DIAGNOSIS — K219 Gastro-esophageal reflux disease without esophagitis: Secondary | ICD-10-CM | POA: Diagnosis not present

## 2023-02-17 DIAGNOSIS — Z8249 Family history of ischemic heart disease and other diseases of the circulatory system: Secondary | ICD-10-CM | POA: Diagnosis not present

## 2023-02-17 DIAGNOSIS — I5032 Chronic diastolic (congestive) heart failure: Secondary | ICD-10-CM | POA: Diagnosis not present

## 2023-02-17 DIAGNOSIS — R918 Other nonspecific abnormal finding of lung field: Secondary | ICD-10-CM | POA: Diagnosis not present

## 2023-02-17 DIAGNOSIS — Z8701 Personal history of pneumonia (recurrent): Secondary | ICD-10-CM | POA: Diagnosis not present

## 2023-02-17 DIAGNOSIS — I110 Hypertensive heart disease with heart failure: Secondary | ICD-10-CM | POA: Diagnosis not present

## 2023-02-17 DIAGNOSIS — F1010 Alcohol abuse, uncomplicated: Secondary | ICD-10-CM | POA: Diagnosis present

## 2023-02-17 DIAGNOSIS — Z79899 Other long term (current) drug therapy: Secondary | ICD-10-CM | POA: Diagnosis not present

## 2023-02-17 DIAGNOSIS — Z8616 Personal history of COVID-19: Secondary | ICD-10-CM | POA: Diagnosis not present

## 2023-02-17 DIAGNOSIS — I252 Old myocardial infarction: Secondary | ICD-10-CM | POA: Diagnosis not present

## 2023-02-17 DIAGNOSIS — E785 Hyperlipidemia, unspecified: Secondary | ICD-10-CM | POA: Diagnosis not present

## 2023-02-17 DIAGNOSIS — E119 Type 2 diabetes mellitus without complications: Secondary | ICD-10-CM | POA: Diagnosis not present

## 2023-02-17 DIAGNOSIS — J441 Chronic obstructive pulmonary disease with (acute) exacerbation: Secondary | ICD-10-CM | POA: Diagnosis not present

## 2023-02-17 DIAGNOSIS — Z1152 Encounter for screening for COVID-19: Secondary | ICD-10-CM | POA: Diagnosis not present

## 2023-02-17 DIAGNOSIS — Z825 Family history of asthma and other chronic lower respiratory diseases: Secondary | ICD-10-CM | POA: Diagnosis not present

## 2023-02-17 DIAGNOSIS — B0223 Postherpetic polyneuropathy: Secondary | ICD-10-CM | POA: Diagnosis not present

## 2023-02-17 DIAGNOSIS — Z7984 Long term (current) use of oral hypoglycemic drugs: Secondary | ICD-10-CM | POA: Diagnosis not present

## 2023-02-17 DIAGNOSIS — J9601 Acute respiratory failure with hypoxia: Secondary | ICD-10-CM | POA: Diagnosis not present

## 2023-02-17 DIAGNOSIS — J439 Emphysema, unspecified: Secondary | ICD-10-CM | POA: Diagnosis not present

## 2023-02-17 DIAGNOSIS — Z7952 Long term (current) use of systemic steroids: Secondary | ICD-10-CM | POA: Diagnosis not present

## 2023-02-17 LAB — GLUCOSE, CAPILLARY
Glucose-Capillary: 152 mg/dL — ABNORMAL HIGH (ref 70–99)
Glucose-Capillary: 123 mg/dL — ABNORMAL HIGH (ref 70–99)
Glucose-Capillary: 191 mg/dL — ABNORMAL HIGH (ref 70–99)

## 2023-02-17 LAB — CBG MONITORING, ED: Glucose-Capillary: 156 mg/dL — ABNORMAL HIGH (ref 70–99)

## 2023-02-17 NOTE — Progress Notes (Signed)
SATURATION QUALIFICATIONS: (This note is used to comply with regulatory documentation for home oxygen)  Patient Saturations on Room Air at Rest = 95%  Patient Saturations on Room Air while Ambulating = 91%   Please briefly explain why patient needs home oxygen: pt reports he desats when sleeping and wears O2 at night. No significant desaturation noted with mobility on RA.   Wynn Maudlin, DPT Acute Rehabilitation Services Office [phone]  02/17/23 2:34 PM

## 2023-02-17 NOTE — Progress Notes (Signed)
PROGRESS NOTE    James [name]  JWJ:[id number] DOB: [birth year] DOA: 02/16/2023 PCP: Marcine Matar, MD   Brief Narrative:  This 63 yrs old male with medical history significant for COPD, chronic diastolic heart failure, hypertension, type 2 diabetes presenting with shortness of breath with exertion and worsening productive cough. He has been adherent with his home Breztri and albuterol inhalers and has been using it  without any improvement in his symptoms.  He is not on home oxygen and denies any sick contacts.  Patient was significantly hypoxic requiring up to 10 L of supplemental oxygen on arrival in the ED.  Patient was given Solu-Medrol , magnesium and DuoNeb nebulization.  Chest x-ray shows emphysema,  chronic bronchitic changes,  no obvious focal consolidation noted.  Patient admitted for COPD exacerbation.   Assessment & Plan:   Active Problems:   COPD with acute exacerbation (HCC)  Acute hypoxic respiratory failure; COPD exacerbation: Patient presented with worsening shortness of breath on exertion and productive cough. He was significantly hypoxic, requiring up to 10 L oxygen via Venturi mask but appears to be improved . VBG without hypercarbia and chest x-ray without any acute infection.   He is on [place] and as needed albuterol at home.  He follows [facility] as an outpatient. Continue Dulera and Incruse Ellipta in place of home Breztri Continue DuoNebs every 4 hours as needed. Continue doxycycline for 5 days Continue Prednisone 40 mg daily for 4 more days Respiratory viral panel, COVID, flu, RSV negative. Continue Supplemental oxygen as needed to maintain O2 sats greater than 88% Flutter valve, incentive spirometry PT/OT eval, check ambulatory O2 sats with and without oxygen  Chronic diastolic CHF Last ECHO 11/2022 with recovered EF to 55-60%, no RWMA.  He appears euvolemic on exam and BNP normal Resume home Lasix, losartan, Toprol-XL   Essential  HTN: Continue losartan 100 mg daily Continue Toprol-XL 50 mg daily at home  DM II: HbA1c 6.2% in 12/2022 CBG this a.m. at 343, likely elevated in the setting of receiving steroids for COPD exacerbation Continue  moderate SSI Carb modified diet -trend CBGs, goal 140-180   Alcohol use disorder: Patient is daily alcohol user, last drink yesterday.  Continue CIWA without ativan for now. CIWA without ativan TOC consulted for substance use resources.   Hyperlipidemia: Continue Lipitor 10 mg daily.   GERD: Continue Protonix 40 mg daily.   DVT prophylaxis: Lovenox Code Status: Full code Family Communication: No family at bedside Disposition Plan:   Status is: Inpatient Remains inpatient appropriate because: Severity of illness.    Consultants:  None  Procedures: None Antimicrobials:  Anti-infectives (From admission, onward)    Start     Dose/Rate Route Frequency Ordered Stop   02/16/23 1300  doxycycline (VIBRA-TABS) tablet 100 mg        100 mg Oral Every 12 hours 02/16/23 0824     02/16/23 0130  doxycycline (VIBRAMYCIN) 100 mg in sodium chloride 0.9 % 250 mL IVPB  Status:  Discontinued        100 mg 125 mL/hr over 120 Minutes Intravenous Every 12 hours 02/16/23 0122 02/16/23 0824      Subjective: Patient was seen and examined at bedside.  Overnight events noted. Patient still remains on 2 L of supplemental oxygen,  at baseline he does not use oxygen.   He reports improving.  Objective: Vitals:   02/17/23 0700 02/17/23 0834 02/17/23 1105 02/17/23 1114  BP: 139/76 138/82  135/68  Pulse: 69 80  85  Resp: 19 20 16 17   Temp:  (!) 97.3 F (36.3 C)  98.3 F (36.8 C)  TempSrc:  Oral  Oral  SpO2: 97% 97% 97% 98%  Weight:      Height:        Intake/Output Summary (Last 24 hours) at 02/17/2023 1128 Last data filed at 02/16/2023 1536 Gross per 24 hour  Intake 400 ml  Output 800 ml  Net -400 ml   Filed Weights   02/16/23 0919  Weight: 90.7 kg     Examination:  General exam: Appears calm and comfortable, deconditioned, not in any acute distress. Respiratory system: Mild wheezing noted bilaterally. Respiratory effort normal. RR 16 Cardiovascular system: S1 & S2 heard, RRR. No JVD, murmurs, rubs, gallops or clicks. No pedal edema. Gastrointestinal system: Abdomen is nondistended, soft and non tender.  Normal bowel sounds heard. Central nervous system: Alert and oriented X 3. No focal neurological deficits. Extremities: No edema, no cyanosis, no clubbing. Skin: No rashes, lesions or ulcers Psychiatry: Judgement and insight appear normal. Mood & affect appropriate.     Data Reviewed: I have personally reviewed following labs and imaging studies  CBC: Recent Labs  Lab 02/16/23 0012 02/16/23 0135  WBC 12.6*  --   HGB 14.0 14.6  HCT 42.9 43.0  MCV 95.3  --   PLT 234  --    Basic Metabolic Panel: Recent Labs  Lab 02/16/23 0012 02/16/23 0135  NA 139 139  K 3.7 3.5  CL 101  --   CO2 25  --   GLUCOSE 74  --   BUN 17  --   CREATININE 0.85  --   CALCIUM 8.9  --    GFR: Estimated Creatinine Clearance: 101.6 mL/min (by C-G formula based on SCr of 0.85 mg/dL). Liver Function Tests: No results for input(s): "AST", "ALT", "ALKPHOS", "BILITOT", "PROT", "ALBUMIN" in the last 168 hours. No results for input(s): "LIPASE", "AMYLASE" in the last 168 hours. No results for input(s): "AMMONIA" in the last 168 hours. Coagulation Profile: No results for input(s): "INR", "PROTIME" in the last 168 hours. Cardiac Enzymes: No results for input(s): "CKTOTAL", "CKMB", "CKMBINDEX", "TROPONINI" in the last 168 hours. BNP (last 3 results) No results for input(s): "PROBNP" in the last 8760 hours. HbA1C: Recent Labs    02/16/23 0900  HGBA1C 5.9*   CBG: Recent Labs  Lab 02/16/23 1439 02/16/23 1901 02/16/23 2250 02/17/23 0831 02/17/23 1116  GLUCAP 199* 147* 116* 156* 152*   Lipid Profile: No results for input(s): "CHOL",  "HDL", "LDLCALC", "TRIG", "CHOLHDL", "LDLDIRECT" in the last 72 hours. Thyroid Function Tests: No results for input(s): "TSH", "T4TOTAL", "FREET4", "T3FREE", "THYROIDAB" in the last 72 hours. Anemia Panel: No results for input(s): "VITAMINB12", "FOLATE", "FERRITIN", "TIBC", "IRON", "RETICCTPCT" in the last 72 hours. Sepsis Labs: No results for input(s): "PROCALCITON", "LATICACIDVEN" in the last 168 hours.  Recent Results (from the past 240 hours)  Respiratory (~20 pathogens) panel by PCR     Status: None   Collection Time: 02/16/23 10:20 AM   Specimen: Nasopharyngeal Swab; Respiratory  Result Value Ref Range Status   Adenovirus NOT DETECTED NOT DETECTED Final   Coronavirus 229E NOT DETECTED NOT DETECTED Final    Comment: (NOTE) The Coronavirus on the Respiratory Panel, DOES NOT test for the novel  Coronavirus (2019 nCoV)    Coronavirus HKU1 NOT DETECTED NOT DETECTED Final   Coronavirus NL63 NOT DETECTED NOT DETECTED Final   Coronavirus OC43 NOT DETECTED NOT DETECTED Final  Metapneumovirus NOT DETECTED NOT DETECTED Final   Rhinovirus / Enterovirus NOT DETECTED NOT DETECTED Final   Influenza A NOT DETECTED NOT DETECTED Final   Influenza B NOT DETECTED NOT DETECTED Final   Parainfluenza Virus 1 NOT DETECTED NOT DETECTED Final   Parainfluenza Virus 2 NOT DETECTED NOT DETECTED Final   Parainfluenza Virus 3 NOT DETECTED NOT DETECTED Final   Parainfluenza Virus 4 NOT DETECTED NOT DETECTED Final   Respiratory Syncytial Virus NOT DETECTED NOT DETECTED Final   Bordetella pertussis NOT DETECTED NOT DETECTED Final   Bordetella Parapertussis NOT DETECTED NOT DETECTED Final   Chlamydophila pneumoniae NOT DETECTED NOT DETECTED Final   Mycoplasma pneumoniae NOT DETECTED NOT DETECTED Final    Comment: Performed at [facility], [address], [place], [place] [address]  Resp panel by RT-PCR (RSV, Flu A&B, Covid) Anterior Nasal Swab     Status: None   Collection Time: 02/16/23 10:20 AM    Specimen: Anterior Nasal Swab  Result Value Ref Range Status   SARS Coronavirus 2 by RT PCR NEGATIVE NEGATIVE Final   Influenza A by PCR NEGATIVE NEGATIVE Final   Influenza B by PCR NEGATIVE NEGATIVE Final    Comment: (NOTE) The Xpert Xpress SARS-CoV-2/FLU/RSV plus assay is intended as an aid in the diagnosis of influenza from Nasopharyngeal swab specimens and should not be used as a sole basis for treatment. Nasal washings and aspirates are unacceptable for Xpert Xpress SARS-CoV-2/FLU/RSV testing.  Fact Sheet for Patients: BloggerCourse.com  Fact Sheet for Healthcare Providers: SeriousBroker.it  This test is not yet approved or cleared by the [place] FDA and has been authorized for detection and/or diagnosis of SARS-CoV-2 by FDA under an Emergency Use Authorization (EUA). This EUA will remain in effect (meaning this test can be used) for the duration of the COVID-19 declaration under Section 564(b)(1) of the Act, 21 U.S.C. section 360bbb-3(b)(1), unless the authorization is terminated or revoked.     Resp Syncytial Virus by PCR NEGATIVE NEGATIVE Final    Comment: (NOTE) Fact Sheet for Patients: BloggerCourse.com  Fact Sheet for Healthcare Providers: SeriousBroker.it  This test is not yet approved or cleared by the [place] FDA and has been authorized for detection and/or diagnosis of SARS-CoV-2 by FDA under an Emergency Use Authorization (EUA). This EUA will remain in effect (meaning this test can be used) for the duration of the COVID-19 declaration under Section 564(b)(1) of the Act, 21 U.S.C. section 360bbb-3(b)(1), unless the authorization is terminated or revoked.  Performed at [facility], [address], [place], [place] [address]     Radiology Studies: DG Chest 2 View Result Date: 02/16/2023 CLINICAL DATA:  COPD EXAM: CHEST - 2 VIEW COMPARISON:   01/02/2023 FINDINGS: Heart mediastinal contours are within normal limits. Peribronchial thickening and hyperinflation. No confluent opacities or effusions. No acute bony abnormality. IMPRESSION: Emphysema.  Probable chronic bronchitic changes. No active disease. Electronically Signed   By: Charlett Nose M.D.   On: 02/16/2023 00:48   Scheduled Meds:  atorvastatin  10 mg Oral Daily   doxycycline  100 mg Oral Q12H   enoxaparin (LOVENOX) injection  40 mg Subcutaneous Daily   folic acid  1 mg Oral Daily   furosemide  40 mg Oral Daily   insulin aspart  0-15 Units Subcutaneous TID WC   insulin aspart  0-5 Units Subcutaneous QHS   losartan  100 mg Oral Daily   metoprolol succinate  50 mg Oral Daily   mometasone-formoterol  2 puff Inhalation BID   multivitamin with minerals  1 tablet Oral Daily   pantoprazole  40 mg Oral Daily   predniSONE  40 mg Oral Q breakfast   thiamine  100 mg Oral Daily   Or   thiamine  100 mg Intravenous Daily   umeclidinium bromide  1 puff Inhalation Daily   Continuous Infusions:   LOS: 1 day    Time spent: 50 MINS    Willeen Niece, MD [facility]   If 7PM-7AM, please contact night-coverage

## 2023-02-17 NOTE — Telephone Encounter (Signed)
Requested office notes successfully faxed to [employer] [facility] on 02/17/2023 Fax #: [phone].

## 2023-02-17 NOTE — Evaluation (Signed)
Occupational Therapy Evaluation Patient Details Name: James [name] MRN: [id number] DOB: 11/01/1960 Today's Date: 02/17/2023   History of Present Illness   63 y.o. male presents to [facility] 02/16/23 with SOB and worsening cough. Respiratory failure likely related to acute on chronic COPD and hypoxia.  PMHx:  recurrent PNA, COPD on 2L, lobectomy d/t cancer, Covid, DM, HTN, ETOH abuse.     Clinical Impressions Pt currently supervision level for selfcare tasks sit to stand and for functional transfers without use of an assistive device.  He exhibits dyspnea 3/4 with minimal activity but O2 sats at 93% or better on room air throughout session.  Prior to admission pt lived by himself and was independent with ADLs.  He uses 2 Ls nasal cannula at home.  Feel he will benefit from acute care OT to provide education on DME, energy conservation, and work on higher level balance.  Do not anticipated any post acute OT needs at this time.        Functional Status Assessment   Patient has had a recent decline in their functional status and demonstrates the ability to make significant improvements in function in a reasonable and predictable amount of time.     Equipment Recommendations   None recommended by OT (Pt declines shower seat)     Recommendations for Other Services         Precautions/Restrictions   Precautions Precautions: Fall Precaution/Restrictions Comments: monitor sats Restrictions Weight Bearing Restrictions Per Provider Order: No     Mobility Bed Mobility Overal bed mobility: Modified Independent                  Transfers Overall transfer level: Needs assistance Equipment used: None Transfers: Sit to/from Stand Sit to Stand: Supervision           General transfer comment: Supervison for functional mobility greater than 200' without use of an assistive device.  Dyspnea 3/4 but sats at 93% on room air initially after walking.      Balance Overall  balance assessment: Needs assistance, Mild deficits observed, not formally tested Sitting-balance support: No upper extremity supported, Feet supported Sitting balance-Leahy Scale: Good     Standing balance support: No upper extremity supported Standing balance-Leahy Scale: Good Standing balance comment: No LOB with functional transfers and mobility                           ADL either performed or assessed with clinical judgement   ADL Overall ADL's : Needs assistance/impaired Eating/Feeding: Independent;Sitting   Grooming: Wash/dry hands;Wash/dry face;Standing;Supervision/safety   Upper Body Bathing: Set up;Sitting   Lower Body Bathing: Supervison/ safety;Sit to/from stand   Upper Body Dressing : Supervision/safety;Sitting   Lower Body Dressing: Supervision/safety;Sit to/from stand   Toilet Transfer: Supervision/safety;Ambulation   Toileting- Clothing Manipulation and Hygiene: Supervision/safety;Sit to/from stand       Functional mobility during ADLs: Supervision/safety (functional mobility without assistive device.) General ADL Comments: Pt currently ambulates without assistive device.  He has home O2 at 2Ls but maintains sats at 93% or better on room air with ambulation in the hallway.  He reports getting down in his tub to bathe and is not interested in using a shower seat.  Will benefit from energy conservation education.     Vision Baseline Vision/History: 0 No visual deficits Ability to See in Adequate Light: 0 Adequate Patient Visual Report: No change from baseline Vision Assessment?: No apparent visual deficits  Perception Perception: Within Functional Limits       Praxis Praxis: WFL       Pertinent Vitals/Pain Pain Assessment Pain Assessment: No/denies pain     Extremity/Trunk Assessment Upper Extremity Assessment Upper Extremity Assessment: Overall WFL for tasks assessed (slight tremors in hand noted)   Lower Extremity  Assessment Lower Extremity Assessment: Overall WFL for tasks assessed   Cervical / Trunk Assessment Cervical / Trunk Assessment: Normal   Communication     Cognition Arousal: Alert                                                      Home Living Family/patient expects to be discharged to:: Private residence Living Arrangements: Alone Available Help at Discharge: Family;Available PRN/intermittently Type of Home: House Home Access: Stairs to enter [employer] of Steps: 2 Entrance Stairs-Rails: None Home Layout: One level     Bathroom Shower/Tub: Chief Strategy Officer: Standard Bathroom Accessibility: Yes   Home Equipment: None   Additional Comments: wears 2L O2 baseline      Prior Functioning/Environment Prior Level of Function : Independent/Modified Independent             Mobility Comments: no AD use ADLs Comments: ind with ADLs; utilizes transportation services    OT Problem List: Decreased activity tolerance;Impaired balance (sitting and/or standing)   OT Treatment/Interventions: Self-care/ADL training;Patient/family education;Balance training;Therapeutic activities;Energy conservation;DME and/or AE instruction      OT Goals(Current goals can be found in the care plan section)   Acute Rehab OT Goals Patient Stated Goal: Pt hopes to go home soon and wants to be breathing better OT Goal Formulation: With patient Time For Goal Achievement: 03/03/23 Potential to Achieve Goals: Good   OT Frequency:  Min 1X/week       AM-PAC OT "6 Clicks" Daily Activity     Outcome Measure Help from another person eating meals?: None Help from another person taking care of personal grooming?: A Little Help from another person toileting, which includes using toliet, bedpan, or urinal?: A Little Help from another person bathing (including washing, rinsing, drying)?: A Little Help from another person to put on and taking off  regular upper body clothing?: A Little Help from another person to put on and taking off regular lower body clothing?: A Little 6 Click Score: 19   End of Session Equipment Utilized During Treatment: Gait belt Nurse Communication: Mobility status  Activity Tolerance: Patient limited by fatigue Patient left: in bed;with call bell/phone within reach  OT Visit Diagnosis: Unsteadiness on feet (R26.81)                Time: 6440-3474 OT Time Calculation (min): 21 min Charges:  OT General Charges $OT Visit: 1 Visit OT Evaluation $OT Eval Moderate Complexity: 1 Mod  Perrin Maltese, OTR/L Acute Rehabilitation Services  Office [phone] 02/17/2023

## 2023-02-17 NOTE — Progress Notes (Signed)
Physical Therapy Treatment Patient Details Name: James [name] MRN: [id number] DOB: 09-06-1960 Today's Date: 02/17/2023   History of Present Illness 63 y.o. male presents to [facility] 02/16/23 with SOB and worsening cough. Respiratory failure likely related to acute on chronic COPD and hypoxia.  PMHx:  recurrent PNA, COPD on 2L, lobectomy d/t cancer, Covid, DM, HTN, ETOH abuse.    PT Comments  Patient resting in bed and agreeable to mobilize with therapy, denies pain/discomfort at this time. Pt completing bed mob and transfers at sup/Mod Ind level with no AD. Pt amb 2x~275' with no AD, no overt LOB. Pt noted to have increased WOB and cues required for grading pace as pt has tendency to walk quickly. SpO2 maintained between 91-94% on RA with gait, recovered to 95% on RA with seated rest. Pt safely negotiated stairs with and without hand rail for home entrance simulation. He is mobilizing at safe level to continue with RN/NT/MS team and has no further skilled acute PT needs. Therapist is signing off at this time.   If plan is discharge home, recommend the following: A little help with walking and/or transfers;A little help with bathing/dressing/bathroom;Assist for transportation;Help with stairs or ramp for entrance   Can travel by private vehicle        Equipment Recommendations  None recommended by PT    Recommendations for Other Services       Precautions / Restrictions Precautions Precautions: Fall Recall of Precautions/Restrictions: Intact Precaution/Restrictions Comments: monitor sats Restrictions Weight Bearing Restrictions Per Provider Order: No     Mobility  Bed Mobility Overal bed mobility: Modified Independent                  Transfers Overall transfer level: Modified independent Equipment used: None Transfers: Sit to/from Stand Sit to Stand: Modified independent (Device/Increase time)           General transfer comment: use of hands     Ambulation/Gait Ambulation/Gait assistance: Supervision Gait Distance (Feet): 275 Feet (2x) Assistive device: None Gait Pattern/deviations: Step-through pattern, Wide base of support Gait velocity: fair         Stairs Stairs: Yes Stairs assistance: Contact guard assist Stair Management: Two rails, No rails, Alternating pattern, Forwards Number of Stairs: 2 (2x2) General stair comments: CGA for safety with stairs, pt ascend/descend alt pattern, 1st bout w/rails, seoncd bout w/o.   Wheelchair Mobility     Tilt Bed    Modified Rankin (Stroke Patients Only)       Balance Overall balance assessment: Needs assistance, Mild deficits observed, not formally tested Sitting-balance support: No upper extremity supported, Feet supported Sitting balance-Leahy Scale: Good     Standing balance support: No upper extremity supported Standing balance-Leahy Scale: Good Standing balance comment: No LOB with functional transfers and mobility                            Communication Communication Communication: Impaired Factors Affecting Communication: Hearing impaired  Cognition Arousal: Alert Behavior During Therapy: WFL for tasks assessed/performed   PT - Cognitive impairments: No apparent impairments                       PT - Cognition Comments: A&Ox4 Following commands: Intact      Cueing Cueing Techniques: Verbal cues  Exercises      General Comments        Pertinent Vitals/Pain Pain Assessment Pain Assessment: No/denies pain  Home Living Family/patient expects to be discharged to:: Private residence Living Arrangements: Alone                      Prior Function            PT Goals (current goals can now be found in the care plan section) Acute Rehab PT Goals Patient Stated Goal: to go home PT Goal Formulation: With patient Time For Goal Achievement: 03/02/23 Potential to Achieve Goals: Good Progress towards PT goals:  Goals met/education completed, patient discharged from PT    Frequency    Min 1X/week      PT Plan      Co-evaluation              AM-PAC PT "6 Clicks" Mobility   Outcome Measure  Help needed turning from your back to your side while in a flat bed without using bedrails?: None Help needed moving from lying on your back to sitting on the side of a flat bed without using bedrails?: None Help needed moving to and from a bed to a chair (including a wheelchair)?: None Help needed standing up from a chair using your arms (e.g., wheelchair or bedside chair)?: None Help needed to walk in hospital room?: A Little Help needed climbing 3-5 steps with a railing? : A Little 6 Click Score: 22    End of Session Equipment Utilized During Treatment: Gait belt Activity Tolerance: Patient tolerated treatment well Patient left: in chair;with call bell/phone within reach Nurse Communication: Mobility status PT Visit Diagnosis: Other abnormalities of gait and mobility (R26.89)     Time: 4098-1191 PT Time Calculation (min) (ACUTE ONLY): 26 min  Charges:    $Gait Training: 23-37 mins PT General Charges $$ ACUTE PT VISIT: 1 Visit                     Wynn Maudlin, DPT Acute Rehabilitation Services Office [phone]  02/17/23 2:29 PM

## 2023-02-18 ENCOUNTER — Other Ambulatory Visit: Payer: Self-pay

## 2023-02-18 DIAGNOSIS — J441 Chronic obstructive pulmonary disease with (acute) exacerbation: Secondary | ICD-10-CM | POA: Diagnosis not present

## 2023-02-18 LAB — BASIC METABOLIC PANEL
Anion gap: 8 (ref 5–15)
BUN: 24 mg/dL — ABNORMAL HIGH (ref 8–23)
CO2: 26 mmol/L (ref 22–32)
Calcium: 9 mg/dL (ref 8.9–10.3)
Chloride: 105 mmol/L (ref 98–111)
Creatinine, Ser: 0.8 mg/dL (ref 0.61–1.24)
GFR, Estimated: >60 mL/min (ref >60)
Glucose, Bld: 113 mg/dL — ABNORMAL HIGH (ref 70–99)
Potassium: 4.1 mmol/L (ref 3.5–5.1)
Sodium: 139 mmol/L (ref 135–145)

## 2023-02-18 LAB — CBC
HCT: 43.4 % (ref 39.0–52.0)
Hemoglobin: 14.3 g/dL (ref 13.0–17.0)
MCH: 31.4 pg (ref 26.0–34.0)
MCHC: 32.9 g/dL (ref 30.0–36.0)
MCV: 95.2 fL (ref 80.0–100.0)
Platelets: 210 10*3/uL (ref 150–400)
RBC: 4.56 MIL/uL (ref 4.22–5.81)
RDW: 16 % — ABNORMAL HIGH (ref 11.5–15.5)
WBC: 11.3 10*3/uL — ABNORMAL HIGH (ref 4.0–10.5)
nRBC: 0 % (ref 0.0–0.2)

## 2023-02-18 LAB — GLUCOSE, CAPILLARY
Glucose-Capillary: 104 mg/dL — ABNORMAL HIGH (ref 70–99)
Glucose-Capillary: 107 mg/dL — ABNORMAL HIGH (ref 70–99)
Glucose-Capillary: 165 mg/dL — ABNORMAL HIGH (ref 70–99)

## 2023-02-18 LAB — PHOSPHORUS: Phosphorus: 3.8 mg/dL (ref 2.5–4.6)

## 2023-02-18 LAB — MAGNESIUM: Magnesium: 2.2 mg/dL (ref 1.7–2.4)

## 2023-02-18 MED ORDER — DOXYCYCLINE HYCLATE 100 MG PO TABS
100.0000 mg | ORAL_TABLET | Freq: Two times a day (BID) | ORAL | 0 refills | Status: AC
  Filled 2023-02-18 – 2023-02-22 (×3): qty 6, 3d supply, fill #0

## 2023-02-18 MED ORDER — PREDNISONE 20 MG PO TABS
40.0000 mg | ORAL_TABLET | Freq: Every day | ORAL | 0 refills | Status: AC
  Filled 2023-02-18 – 2023-02-22 (×3): qty 6, 3d supply, fill #0

## 2023-02-18 NOTE — Progress Notes (Signed)
DISCHARGE NOTE HOME James [name] to be discharged Home per MD order. Discussed prescriptions and follow up appointments with the patient. Prescriptions given to patient; medication list explained in detail. Patient verbalized understanding.  Skin clean, dry and intact without evidence of skin break down, no evidence of skin tears noted. IV catheter discontinued intact. Site without signs and symptoms of complications. Dressing and pressure applied. Pt denies pain at the site currently. No complaints noted.  Patient free of lines, drains, and wounds other than noted on LDA  An After Visit Summary (AVS) was printed and given to the patient.  Patient escorted via wheelchair, and discharged home via private auto.  Velia Meyer, RN

## 2023-02-18 NOTE — Discharge Summary (Signed)
Physician Discharge Summary  James [name]:[id number] DOB: [birth year] DOA: 02/16/2023  PCP: Marcine Matar, MD  Admit date: 02/16/2023  Discharge date: 02/18/2023  Admitted From: Home  Disposition:  Home.  Recommendations for Outpatient Follow-up:  Follow up with PCP in 1-2 weeks. Please obtain BMP/CBC in one week. Advised to take doxycycline 100 mg bid for three days for COPD EXACERBATION. Advised to take prednisone 40 mg daily for three days for COPD.  Home Health: None Equipment/Devices: None  Discharge Condition: Stable CODE STATUS:Full code Diet recommendation: Heart Healthy   Brief [facility] Course: This 63 yrs old male with medical history significant for COPD, chronic diastolic heart failure, hypertension, type 2 diabetes presenting with shortness of breath with exertion and worsening productive cough. He has been adherent with his home Breztri and albuterol inhalers and has been using it without any improvement in his symptoms. He is not on home oxygen and denies any sick contacts.Patient was significantly hypoxic requiring up to 10 L of supplemental oxygen on arrival in the ED.  Patient was given Solu-Medrol , magnesium and DuoNeb nebulization.  Chest x-ray shows emphysema,  chronic bronchitic changes,  no obvious focal consolidation noted.  Patient was admitted for COPD exacerbation.Patient was continued on IV Solu-Medrol and scheduled and as needed DuoNeb nebulizations.  Patient has made significant improvement,  He successfully weaned down to room air.  Patient feels much improved and wants to be discharged.  Patient is being discharged on doxycycline and prednisone for 3 more days.  Discharge Diagnoses:  Active Problems:   COPD with acute exacerbation (HCC)  Acute hypoxic respiratory failure: COPD exacerbation: Patient presented with worsening shortness of breath on exertion and productive cough. He was significantly hypoxic, requiring up to 10 L oxygen  via Venturi mask but appears to be improved . VBG without hypercarbia and chest x-ray without any acute infection.   He is on Wiseman and as needed albuterol at home.  He follows [facility] as an outpatient. Continue Dulera and Incruse Ellipta in place of home Breztri Continue DuoNebs every 4 hours as needed. Continue doxycycline for 5 days Continue Prednisone 40 mg daily for 4 more days Respiratory viral panel, COVID, flu, RSV negative. Continue Supplemental oxygen as needed to maintain O2 sats greater than 88% Flutter valve, incentive spirometry Patient successfully weaned down to room air. Feels better and wants to be discharged.   Chronic diastolic CHF: Last ECHO 11/2022 with recovered EF to 55-60%, no RWMA.  He appears euvolemic on exam and BNP normal Resume home Lasix, losartan, Toprol-XL   Essential HTN: Continue losartan 100 mg daily Continue Toprol-XL 50 mg daily at home   DM II: HbA1c 6.2% in 12/2022 CBG this a.m. at 343, likely elevated in the setting of receiving steroids for COPD exacerbation Continue  moderate SSI Carb modified diet -trend CBGs, goal 140-180   Alcohol use disorder: Patient is daily alcohol user, last drink yesterday.  Continue CIWA without ativan for now. CIWA without ativan TOC consulted for substance use resources.   Hyperlipidemia: Continue Lipitor 10 mg daily.   GERD: Continue Protonix 40 mg daily.    Discharge Instructions  Discharge Instructions     Call MD for:  difficulty breathing, headache or visual disturbances   Complete by: As directed    Call MD for:  persistant dizziness or light-headedness   Complete by: As directed    Call MD for:  persistant nausea and vomiting   Complete by: As directed  Diet - low sodium heart healthy   Complete by: As directed    Diet Carb Modified   Complete by: As directed    Discharge instructions   Complete by: As directed    Advised to follow up PCP in one week. Advised to  take doxycycline 100 mg bid for three days for COPD EXACERBATION. Advised to take prednisone 40 mg daily for three days for COD.   Increase activity slowly   Complete by: As directed       Allergies as of 02/18/2023   No Known Allergies      Medication List     TAKE these medications    albuterol (2.5 MG/3ML) 0.083% nebulizer solution Commonly known as: PROVENTIL Inhale 3 mLs (2.5 mg total) by nebulization every 4 (four) hours as needed for wheezing or shortness of breath. What changed: when to take this   Ventolin HFA 108 (90 Base) MCG/ACT inhaler Generic drug: albuterol Inhale 2 puffs into the lungs every 6 (six) hours as needed for wheezing or shortness of breath. What changed:  how much to take when to take this   atorvastatin 10 MG tablet Commonly known as: LIPITOR Take 1 tablet (10 mg total) by mouth daily.   Breztri Aerosphere 160-9-4.8 MCG/ACT Aero Generic drug: Budeson-Glycopyrrol-Formoterol Inhale 2 puffs into the lungs in the morning and at bedtime.   doxycycline 100 MG tablet Commonly known as: VIBRA-TABS Take 1 tablet (100 mg total) by mouth every 12 (twelve) hours for 3 days.   folic acid 1 MG tablet Commonly known as: FOLVITE Take 1 tablet (1 mg total) by mouth daily.   furosemide 40 MG tablet Commonly known as: LASIX Take 1 tablet (40 mg total) by mouth daily.   glipiZIDE 5 MG 24 hr tablet Commonly known as: GLUCOTROL XL Take 1 tablet (5 mg total) by mouth daily with breakfast.   losartan 100 MG tablet Commonly known as: COZAAR Take 1 tablet (100 mg total) by mouth daily.   metoprolol succinate 50 MG 24 hr tablet Commonly known as: TOPROL-XL Take 1 tablet (50 mg total) by mouth daily. Take with or immediately following a meal.   nystatin cream Commonly known as: MYCOSTATIN Apply to affected areas 3 (three) times daily. What changed:  how much to take when to take this reasons to take this   pantoprazole 40 MG tablet Commonly known  as: PROTONIX Take 1 tablet (40 mg total) by mouth daily.   predniSONE 20 MG tablet Commonly known as: DELTASONE Take 2 tablets (40 mg total) by mouth daily with breakfast for 3 days. Start taking on: February 19, 2023 What changed:  medication strength how much to take   pregabalin 100 MG capsule Commonly known as: Lyrica Take 1 capsule (100 mg total) by mouth 2 (two) times daily. What changed: when to take this        Follow-up Information     Marcine Matar, MD Follow up in 1 week(s).   Specialty: Internal Medicine Contact information: [address] [place] [address] [phone]                No Known Allergies  Consultations: None   Procedures/Studies: DG Chest 2 View Result Date: 02/16/2023 CLINICAL DATA:  COPD EXAM: CHEST - 2 VIEW COMPARISON:  01/02/2023 FINDINGS: Heart mediastinal contours are within normal limits. Peribronchial thickening and hyperinflation. No confluent opacities or effusions. No acute bony abnormality. IMPRESSION: Emphysema.  Probable chronic bronchitic changes. No active disease.  Electronically Signed   By: Charlett Nose M.D.   On: 02/16/2023 00:48     Subjective: Patient was seen and examined at bedside. Overnight events noted.   Patient reports doing much better and wants to be discharged.  He is weaned down to room air.  Discharge Exam: Vitals:   02/18/23 0810 02/18/23 0836  BP: (!) 161/85   Pulse: 64 62  Resp: 19 16  Temp: 98.8 F (37.1 C)   SpO2: 100% 94%   Vitals:   02/17/23 2040 02/18/23 0440 02/18/23 0810 02/18/23 0836  BP: 131/76 (!) 156/88 (!) 161/85   Pulse: 76 68 64 62  Resp: 18 16 19 16   Temp: (!) 97.3 F (36.3 C) 98.2 F (36.8 C) 98.8 F (37.1 C)   TempSrc: Oral Oral Oral   SpO2: 97% 100% 100% 94%  Weight:      Height:        General: Pt is alert, awake, not in acute distress Cardiovascular: RRR, S1/S2 +, no rubs, no gallops Respiratory: CTA bilaterally, no wheezing, no  rhonchi Abdominal: Soft, NT, ND, bowel sounds + Extremities: no edema, no cyanosis    The results of significant diagnostics from this hospitalization (including imaging, microbiology, ancillary and laboratory) are listed below for reference.     Microbiology: Recent Results (from the past 240 hours)  Respiratory (~20 pathogens) panel by PCR     Status: None   Collection Time: 02/16/23 10:20 AM   Specimen: Nasopharyngeal Swab; Respiratory  Result Value Ref Range Status   Adenovirus NOT DETECTED NOT DETECTED Final   Coronavirus 229E NOT DETECTED NOT DETECTED Final    Comment: (NOTE) The Coronavirus on the Respiratory Panel, DOES NOT test for the novel  Coronavirus (2019 nCoV)    Coronavirus HKU1 NOT DETECTED NOT DETECTED Final   Coronavirus NL63 NOT DETECTED NOT DETECTED Final   Coronavirus OC43 NOT DETECTED NOT DETECTED Final   Metapneumovirus NOT DETECTED NOT DETECTED Final   Rhinovirus / Enterovirus NOT DETECTED NOT DETECTED Final   Influenza A NOT DETECTED NOT DETECTED Final   Influenza B NOT DETECTED NOT DETECTED Final   Parainfluenza Virus 1 NOT DETECTED NOT DETECTED Final   Parainfluenza Virus 2 NOT DETECTED NOT DETECTED Final   Parainfluenza Virus 3 NOT DETECTED NOT DETECTED Final   Parainfluenza Virus 4 NOT DETECTED NOT DETECTED Final   Respiratory Syncytial Virus NOT DETECTED NOT DETECTED Final   Bordetella pertussis NOT DETECTED NOT DETECTED Final   Bordetella Parapertussis NOT DETECTED NOT DETECTED Final   Chlamydophila pneumoniae NOT DETECTED NOT DETECTED Final   Mycoplasma pneumoniae NOT DETECTED NOT DETECTED Final    Comment: Performed at [facility], [address], [place], [place] [address]  Resp panel by RT-PCR (RSV, Flu A&B, Covid) Anterior Nasal Swab     Status: None   Collection Time: 02/16/23 10:20 AM   Specimen: Anterior Nasal Swab  Result Value Ref Range Status   SARS Coronavirus 2 by RT PCR NEGATIVE NEGATIVE Final   Influenza A by PCR  NEGATIVE NEGATIVE Final   Influenza B by PCR NEGATIVE NEGATIVE Final    Comment: (NOTE) The Xpert Xpress SARS-CoV-2/FLU/RSV plus assay is intended as an aid in the diagnosis of influenza from Nasopharyngeal swab specimens and should not be used as a sole basis for treatment. Nasal washings and aspirates are unacceptable for Xpert Xpress SARS-CoV-2/FLU/RSV testing.  Fact Sheet for Patients: BloggerCourse.com  Fact Sheet for Healthcare Providers: SeriousBroker.it  This test is not yet  approved or cleared by the [place] and has been authorized for detection and/or diagnosis of SARS-CoV-2 by FDA under an Emergency Use Authorization (EUA). This EUA will remain in effect (meaning this test can be used) for the duration of the COVID-19 declaration under Section 564(b)(1) of the Act, 21 U.S.C. section 360bbb-3(b)(1), unless the authorization is terminated or revoked.     Resp Syncytial Virus by PCR NEGATIVE NEGATIVE Final    Comment: (NOTE) Fact Sheet for Patients: BloggerCourse.com  Fact Sheet for Healthcare Providers: SeriousBroker.it  This test is not yet approved or cleared by the [place] FDA and has been authorized for detection and/or diagnosis of SARS-CoV-2 by FDA under an Emergency Use Authorization (EUA). This EUA will remain in effect (meaning this test can be used) for the duration of the COVID-19 declaration under Section 564(b)(1) of the Act, 21 U.S.C. section 360bbb-3(b)(1), unless the authorization is terminated or revoked.  Performed at [facility], [address], [place], [place] [address]      Labs: BNP (last 3 results) Recent Labs    11/22/22 0841 01/02/23 0602 02/16/23 0120  BNP 106.6* 57.9 46.2   Basic Metabolic Panel: Recent Labs  Lab 02/16/23 0012 02/16/23 0135 02/18/23 0605  NA 139 139 139  K 3.7 3.5 4.1  CL 101  --   105  CO2 25  --  26  GLUCOSE 74  --  113*  BUN 17  --  24*  CREATININE 0.85  --  0.80  CALCIUM 8.9  --  9.0  MG  --   --  2.2  PHOS  --   --  3.8   Liver Function Tests: No results for input(s): "AST", "ALT", "ALKPHOS", "BILITOT", "PROT", "ALBUMIN" in the last 168 hours. No results for input(s): "LIPASE", "AMYLASE" in the last 168 hours. No results for input(s): "AMMONIA" in the last 168 hours. CBC: Recent Labs  Lab 02/16/23 0012 02/16/23 0135 02/18/23 0605  WBC 12.6*  --  11.3*  HGB 14.0 14.6 14.3  HCT 42.9 43.0 43.4  MCV 95.3  --  95.2  PLT 234  --  210   Cardiac Enzymes: No results for input(s): "CKTOTAL", "CKMB", "CKMBINDEX", "TROPONINI" in the last 168 hours. BNP: Invalid input(s): "POCBNP" CBG: Recent Labs  Lab 02/17/23 1647 02/17/23 2213 02/18/23 0438 02/18/23 0850 02/18/23 1118  GLUCAP 123* 191* 104* 107* 165*   D-Dimer No results for input(s): "DDIMER" in the last 72 hours. Hgb A1c Recent Labs    02/16/23 0900  HGBA1C 5.9*   Lipid Profile No results for input(s): "CHOL", "HDL", "LDLCALC", "TRIG", "CHOLHDL", "LDLDIRECT" in the last 72 hours. Thyroid function studies No results for input(s): "TSH", "T4TOTAL", "T3FREE", "THYROIDAB" in the last 72 hours.  Invalid input(s): "FREET3" Anemia work up No results for input(s): "VITAMINB12", "FOLATE", "FERRITIN", "TIBC", "IRON", "RETICCTPCT" in the last 72 hours. Urinalysis    Component Value Date/Time   COLORURINE YELLOW 07/12/2021 0739   APPEARANCEUR CLEAR 07/12/2021 0739   LABSPEC 1.026 07/12/2021 0739   PHURINE 5.0 07/12/2021 0739   GLUCOSEU 150 (A) 07/12/2021 0739   HGBUR NEGATIVE 07/12/2021 0739   BILIRUBINUR NEGATIVE 07/12/2021 0739   BILIRUBINUR negative 09/25/2018 1432   KETONESUR NEGATIVE 07/12/2021 0739   PROTEINUR NEGATIVE 07/12/2021 0739   UROBILINOGEN 0.2 02/19/2019 1029   NITRITE NEGATIVE 07/12/2021 0739   LEUKOCYTESUR NEGATIVE 07/12/2021 0739   Sepsis Labs Recent Labs  Lab  02/16/23 0012 02/18/23 0605  WBC 12.6* 11.3*   Microbiology Recent  Results (from the past 240 hours)  Respiratory (~20 pathogens) panel by PCR     Status: None   Collection Time: 02/16/23 10:20 AM   Specimen: Nasopharyngeal Swab; Respiratory  Result Value Ref Range Status   Adenovirus NOT DETECTED NOT DETECTED Final   Coronavirus 229E NOT DETECTED NOT DETECTED Final    Comment: (NOTE) The Coronavirus on the Respiratory Panel, DOES NOT test for the novel  Coronavirus (2019 nCoV)    Coronavirus HKU1 NOT DETECTED NOT DETECTED Final   Coronavirus NL63 NOT DETECTED NOT DETECTED Final   Coronavirus OC43 NOT DETECTED NOT DETECTED Final   Metapneumovirus NOT DETECTED NOT DETECTED Final   Rhinovirus / Enterovirus NOT DETECTED NOT DETECTED Final   Influenza A NOT DETECTED NOT DETECTED Final   Influenza B NOT DETECTED NOT DETECTED Final   Parainfluenza Virus 1 NOT DETECTED NOT DETECTED Final   Parainfluenza Virus 2 NOT DETECTED NOT DETECTED Final   Parainfluenza Virus 3 NOT DETECTED NOT DETECTED Final   Parainfluenza Virus 4 NOT DETECTED NOT DETECTED Final   Respiratory Syncytial Virus NOT DETECTED NOT DETECTED Final   Bordetella pertussis NOT DETECTED NOT DETECTED Final   Bordetella Parapertussis NOT DETECTED NOT DETECTED Final   Chlamydophila pneumoniae NOT DETECTED NOT DETECTED Final   Mycoplasma pneumoniae NOT DETECTED NOT DETECTED Final    Comment: Performed at [facility], [address], [place], [place] [address]  Resp panel by RT-PCR (RSV, Flu A&B, Covid) Anterior Nasal Swab     Status: None   Collection Time: 02/16/23 10:20 AM   Specimen: Anterior Nasal Swab  Result Value Ref Range Status   SARS Coronavirus 2 by RT PCR NEGATIVE NEGATIVE Final   Influenza A by PCR NEGATIVE NEGATIVE Final   Influenza B by PCR NEGATIVE NEGATIVE Final    Comment: (NOTE) The Xpert Xpress SARS-CoV-2/FLU/RSV plus assay is intended as an aid in the diagnosis of influenza from Nasopharyngeal  swab specimens and should not be used as a sole basis for treatment. Nasal washings and aspirates are unacceptable for Xpert Xpress SARS-CoV-2/FLU/RSV testing.  Fact Sheet for Patients: BloggerCourse.com  Fact Sheet for Healthcare Providers: SeriousBroker.it  This test is not yet approved or cleared by the [place] FDA and has been authorized for detection and/or diagnosis of SARS-CoV-2 by FDA under an Emergency Use Authorization (EUA). This EUA will remain in effect (meaning this test can be used) for the duration of the COVID-19 declaration under Section 564(b)(1) of the Act, 21 U.S.C. section 360bbb-3(b)(1), unless the authorization is terminated or revoked.     Resp Syncytial Virus by PCR NEGATIVE NEGATIVE Final    Comment: (NOTE) Fact Sheet for Patients: BloggerCourse.com  Fact Sheet for Healthcare Providers: SeriousBroker.it  This test is not yet approved or cleared by the [place] FDA and has been authorized for detection and/or diagnosis of SARS-CoV-2 by FDA under an Emergency Use Authorization (EUA). This EUA will remain in effect (meaning this test can be used) for the duration of the COVID-19 declaration under Section 564(b)(1) of the Act, 21 U.S.C. section 360bbb-3(b)(1), unless the authorization is terminated or revoked.  Performed at [facility], [address] [place], [place] [address]      Time coordinating discharge: Over 30 minutes  SIGNED:   Willeen Niece, MD  [facility] 02/18/2023, 12:17 PM Pager   If 7PM-7AM, please contact night-coverage

## 2023-02-18 NOTE — Plan of Care (Signed)
Adequate for discharge.

## 2023-02-18 NOTE — TOC Transition Note (Signed)
Transition of Care [facility]) - Discharge Note   Patient Details  Name: James [name] MRN: [id number] Date of Birth: [birth year]  [facility]) CM/SW Contact:  Kermit Balo, RN Phone Number: 02/18/2023, 10:59 AM   Clinical Narrative:     Pt is discharging home with self care. No follow up per therapies.    Final next level of care: Home/Self Care Barriers to Discharge: No Barriers Identified   Patient Goals and CMS Choice            Discharge Placement                       Discharge Plan and Services Additional resources added to the After Visit Summary for                                       Social Drivers of Health (SDOH) Interventions SDOH Screenings   Food Insecurity: No Food Insecurity (02/17/2023)  Housing: Low Risk  (02/17/2023)  Transportation Needs: Unmet Transportation Needs (02/17/2023)  Utilities: Not At Risk (02/17/2023)  Alcohol Screen: Low Risk  (09/09/2021)  Depression (PHQ2-9): Low Risk  (12/28/2022)  Financial Resource Strain: Low Risk  (12/25/2020)  Recent Concern: Financial Resource Strain - High Risk (10/29/2020)  Physical Activity: Inactive (12/11/2020)  Social Connections: Moderately Integrated (11/13/2020)  Stress: No Stress Concern Present (12/25/2020)  Tobacco Use: Medium Risk (02/16/2023)     Readmission Risk Interventions    02/04/2021   12:44 PM 08/14/2020    9:59 AM  Readmission Risk Prevention Plan  Post Dischage Appt Complete   Medication Screening Complete   Transportation Screening Complete Complete  PCP or Specialist Appt within 3-5 Days  Complete  Not Complete comments  patient already has an appointment schedules with Dr Delford Field  [facility] or Home Care Consult  Complete  Social Work Consult for Recovery Care Planning/Counseling  Complete  Palliative Care Screening  Not Applicable  Medication Review (RN Care Manager)  Complete

## 2023-02-21 ENCOUNTER — Telehealth: Payer: Self-pay

## 2023-02-21 NOTE — Transitions of Care (Post Inpatient/ED Visit) (Signed)
   02/21/2023  Name: James [name] MRN: [id number] DOB: 31-Aug-1960  Today's TOC FU Call Status: Today's TOC FU Call Status:: Successful TOC FU Call Completed TOC FU Call Complete Date: 02/21/23 Patient's Name and Date of Birth confirmed.  Transition Care Management Follow-up Telephone Call Date of Discharge: 02/18/23 Discharge Facility: [facility]) Type of Discharge: Inpatient Admission Primary Inpatient Discharge Diagnosis:: COPD exacerbation How have you been since you were released from the hospital?: Better Any questions or concerns?: Yes Patient Questions/Concerns:: He said his O2 concentrator is not working. he has a self fill system and has been able to fill 3 tanks but needs the concentrator.  I called [facility]: [phone] and spoke to [facility] regarding the patient's need for O2 and the problem with his concentrator. I emphasized the urgency as he was recently discharged from the hospital. she said she put in a ticket for a tech to contact him today.  I confirmed the patient's phone number with her a well as his sister , [name]'s, number.  I then called the patient back and explained to him that he should be getting a call from [facility] today. I also providd him with the phone number for [employer] [facility] in the event that he does not hear from anyone.  He was very Adult nurse. Patient Questions/Concerns Addressed: Other: (noted above.  I contacted [employer] [facility])  Items Reviewed: Did you receive and understand the discharge instructions provided?: Yes Medications obtained,verified, and reconciled?: Partial Review Completed Reason for Partial Mediation Review: He stated that he has all of his medications and a nebulizer and did not have any questions about the med regime and did not need to review the med list.  The patient has limited literacy. Any new allergies since your discharge?: No Dietary orders reviewed?: No Do you have support at home?: Yes People in Home:  alone Name of Support/Comfort Primary Source: his sister, [name], who lives close by provides some assistance  Medications Reviewed Today: Medications Reviewed Today   Medications were not reviewed in this encounter     Home Care and Equipment/Supplies: Were Home Health Services Ordered?: No Any new equipment or medical supplies ordered?: No  Functional Questionnaire: Do you need assistance with bathing/showering or dressing?: No Do you need assistance with meal preparation?: No Do you need assistance with eating?: No Do you have difficulty maintaining continence: No Do you need assistance with getting out of bed/getting out of a chair/moving?: No (He ambulates without an assistive device/  He uses his O2 at night and as needed) Do you have difficulty managing or taking your medications?: Yes (at times he has difficulty and his sister, [name], will assist if she is able.)  Follow up appointments reviewed: PCP Follow-up appointment confirmed?: Yes Date of PCP follow-up appointment?: 03/03/23 Follow-up Provider: [name], [place] Specialist Hospital Follow-up appointment confirmed?: Yes Date of Specialist follow-up appointment?: 04/06/23 Follow-Up Specialty Provider:: dermatology Do you need transportation to your follow-up appointment?: Yes Transportation Need Intervention Addressed By:: Other: (He said [name] will assist with arranging his transportation) Do you understand care options if your condition(s) worsen?: Yes-patient verbalized understanding    SIGNATURE Robyne Peers, RN

## 2023-02-22 ENCOUNTER — Telehealth: Payer: Self-pay

## 2023-02-22 ENCOUNTER — Other Ambulatory Visit (HOSPITAL_COMMUNITY): Payer: Self-pay

## 2023-02-22 ENCOUNTER — Other Ambulatory Visit: Payer: Self-pay

## 2023-02-22 NOTE — Telephone Encounter (Signed)
I called the patient to inquire if he heard from [facility] about services for his home fill O2 system and he said he has not heard anything. He also said that his sister, [name], has not heard from [employer].   I called [facility] and spoke to Castine at the [facility]: [phone] and informed her that the patient still has not heard from anyone about service for his home O2 system. I stressed the importance of the need for follow up today and she said she would place a request for the service tech. I confirmed the patient's phone number with Whitney and instructed her to have the tech call the patient first before reaching out to his sister, [name].   I called the patient and informed him of my call with [employer] [facility] and to expect a call from them today and to call me back if he doesn't hear anything by the end of the day and he said he understood.

## 2023-02-23 NOTE — Telephone Encounter (Signed)
I attempted to reach the patient to inquire if [employer] [facility] contacted him yesterday about his O2 concentrator. His voicemail was full and I was not able to leave a message.    I then spoke to Victoria/ [employer] [facility]: [phone] who confirmed that a technician went to the patient's home  last evening around 2000.

## 2023-02-24 ENCOUNTER — Other Ambulatory Visit: Payer: Self-pay

## 2023-02-24 ENCOUNTER — Other Ambulatory Visit: Payer: Self-pay | Admitting: *Deleted

## 2023-02-24 NOTE — Patient Outreach (Signed)
Medicaid Managed Care   Nurse Care Manager Note  02/24/2023 Name:  James [name] MRN:  [id number] DOB:  Dec 28, 1960  James [name] is an 63 y.o. year old male who is a primary patient of Marcine Matar, MD.  [facility] Managed Care Coordination team was consulted for assistance with:    COPD  Mr. Urwin was given information about Medicaid Managed Care Coordination team services today. James [name] Patient agreed to services and verbal consent obtained.  Engaged with patient by telephone for follow up visit in response to provider referral for case management and/or care coordination services.   Patient is participating in a Managed Medicaid Plan:  Yes  Assessments/Interventions:  Review of past medical history, allergies, medications, health status, including review of consultants reports, laboratory and other test data, was performed as part of comprehensive evaluation and provision of chronic care management services.  SDOH (Social Drivers of Health) assessments and interventions performed: SDOH Interventions    Flowsheet Row Patient Outreach Telephone from 02/24/2023 in [place] [facility] POP[facility] DEPART[facility] Patient Outreach Telephone from 12/23/2022 in [place] POPULATION HEALTH DEPARTMENT Patient Outreach Telephone from 10/13/2022 in [place] POPULATION HEALTH DEPARTMENT Telephone from 07/16/2022 in [place] POPULATION HEALTH DEPARTMENT Patient Outreach Telephone from 06/30/2022 in [place] POPULATION HEALTH DEPARTMENT Patient Outreach Telephone from 05/25/2022 in [place] POPULATION HEALTH DEPARTMENT  SDOH Interventions        Food Insecurity Interventions Intervention Not Indicated -- -- -- -- --  Housing Interventions -- -- -- -- -- Intervention Not Indicated  Transportation Interventions -- --  [Assisted patient with arranging transportation to upcoming appointment] Payor Benefit Payor Benefit Payor Benefit  [assisted with arranging transportation] Payor Benefit   [Assisted with arranging transportation to upcoming appointments]       Care Plan  No Known Allergies  Medications Reviewed Today     Reviewed by [name], RN (Registered Nurse) on 02/24/23 at 1050  Med List Status: <None>   Medication Order Taking? Sig Documenting Provider Last Dose Status Informant  albuterol (PROVENTIL) (2.5 MG/3ML) 0.083% nebulizer solution [id number] Yes Inhale 3 mLs (2.5 mg total) by nebulization every 4 (four) hours as needed for wheezing or shortness of breath.  Patient taking differently: Take 2.5 mg by nebulization at bedtime.   [name], MD Taking Active Self, Pharmacy Records  albuterol (VENTOLIN HFA) [phone] Base) MCG/ACT inhaler [id number] Yes Inhale 2 puffs into the lungs every 6 (six) hours as needed for wheezing or shortness of breath.  Patient taking differently: Inhale 2-3 puffs into the lungs in the morning and at bedtime.   [name], MD Taking Active Self, Pharmacy Records  atorvastatin (LIPITOR) 10 MG tablet [id number] Yes Take 1 tablet (10 mg total) by mouth daily. [name], MD Taking Active Self, Pharmacy Records  Budeson-Glycopyrrol-Formoterol [facility] AEROSPHERE) 160-9-4.8 MCG/ACT [name] [id number] Yes Inhale 2 puffs into the lungs in the morning and at bedtime. [name], MD Taking Active Self, Pharmacy Records           Med Note [name], [facility] A   Tue Nov 02, 2022 10:18 AM)    doxycycline (VIBRA-TABS) 100 MG tablet [id number] No Take 1 tablet (100 mg total) by mouth every 12 (twelve) hours for 3 days.  Patient not taking: Reported on 02/24/2023   Willeen Niece, MD Not Taking Active   folic acid (FOLVITE) 1 MG tablet [id number] Yes Take 1 tablet (1 mg total) by mouth daily. [name]  E, MD Taking Active Self, Pharmacy Records           Med Note (Elishua Radford A   Thu Dec 23, 2022 11:47 AM)    furosemide (LASIX) 40 MG tablet [id number] Yes Take 1 tablet (40 mg total) by mouth daily. [name], MD  Taking Active Self, Pharmacy Records  glipiZIDE (GLUCOTROL XL) 5 MG 24 hr tablet [id number] Yes Take 1 tablet (5 mg total) by mouth daily with breakfast. [name], MD Taking Active Self, Pharmacy Records  losartan (COZAAR) 100 MG tablet [id number] Yes Take 1 tablet (100 mg total) by mouth daily. [name], MD Taking Active Self, Pharmacy Records  metoprolol succinate (TOPROL-XL) 50 MG 24 hr tablet [id number] Yes Take 1 tablet (50 mg total) by mouth daily. Take with or immediately following a meal. [name], MD Taking Active Self, Pharmacy Records           Med Note [name], [facility] A   Thu Dec 23, 2022 11:47 AM)    nystatin cream (MYCOSTATIN) [id number] Yes Apply to affected areas 3 (three) times daily.  Patient taking differently: Apply 1 Application topically as needed (irritation).   [name], MD Taking Active Self, Pharmacy Records           Med Note [name], [name]   Wed Feb 16, 2023  5:57 AM) Last dose unknown; pt keeps on hand for rash breakouts  pantoprazole (PROTONIX) 40 MG tablet [id number] Yes Take 1 tablet (40 mg total) by mouth daily. [name], MD Taking Active Self, Pharmacy Records           Med Note [name], [facility] A   Thu Dec 23, 2022 11:48 AM)    predniSONE (DELTASONE) 20 MG tablet [id number] No Take 2 tablets (40 mg total) by mouth daily with breakfast for 3 days.  Patient not taking: Reported on 02/24/2023   Willeen Niece, MD Not Taking Active   pregabalin (LYRICA) 100 MG capsule [id number] Yes Take 1 capsule (100 mg total) by mouth 2 (two) times daily.  Patient taking differently: Take 100 mg by mouth daily.   Marcine Matar, MD Taking Active Self, Pharmacy Records            Patient Active Problem List   Diagnosis Date Noted   COPD with acute exacerbation (HCC) 02/16/2023   COPD exacerbation (HCC) 11/22/2022   Dysphagia 10/01/2022   Alcohol dependence in remission (HCC) 03/02/2022   Aortic root dilation (HCC) 03/02/2022    Vision changes 12/29/2021   Oropharyngeal dysphagia 12/29/2021   Onychomycosis 12/29/2021   Malnutrition of moderate degree 12/05/2021   Tinea cruris 07/21/2021   Multiple lung nodules on CT 06/11/2021   COPD with chronic bronchitis (HCC) 02/01/2021   GERD (gastroesophageal reflux disease) 02/01/2021   Neuropathy 09/29/2020   Bronchiectasis (HCC) 09/24/2019   Type 2 diabetes mellitus (HCC) 07/24/2019   Other atopic dermatitis 05/01/2019   Acute on chronic respiratory failure (HCC) 04/23/2019   Intellectual disability 04/23/2019   HTN (hypertension) 05/17/2016   Former tobacco use 04/06/2016   COPD mixed type (HCC) 03/26/2016    Conditions to be addressed/monitored per PCP order:  COPD  Care Plan : RN Care Manager Plan of Care  Updates made by [name], RN since 02/24/2023 12:00 AM     Problem: Chronic Disease Management needs in patient with COPD   Priority: High     Long-Range Goal: Plan of care established for for Chronic Disease management  for patient with COPD   Start Date: 11/13/2020  Expected End Date: 03/04/2023  Priority: High  Note:   Current Barriers:  Care Coordination needs related to Limited access to food  Chronic Disease Management support and education needs related to COPD    RNCM Clinical Goal(s):  Patient will verbalize understanding of plan for management of COPD as evidenced by taking all medications as prescribed, attending all scheduled appointments and contacting provider with questions and concerns take all medications exactly as prescribed and will call provider for medication related questions as evidenced by refilling medications and taking as prescribed    attend all scheduled medical appointments: [facility] on 03/01/23 and PCP on 03/03/23 as evidenced by provider notes in EMR        demonstrate improved adherence to prescribed treatment plan for COPD as evidenced by taking all medications as prescribed, attending all scheduled  appointments and contacting provider with questions and concerns continue to work with RN Care Manager and/or Social Worker to address care management and care coordination needs related to COPD as evidenced by adherence to CM Team Scheduled appointments     through collaboration with Medical illustrator, provider, and care team.   Interventions: Inter-disciplinary care team collaboration (see longitudinal plan of care) Evaluation of current treatment plan related to  self management and patient's adherence to plan as established by provider  Ensured patient received provided list of provider contact information and address to each office Discussed the importance of patient learning to make and reschedule appointments and arrange transportation independently Provided encouragement   COPD: (Status: Goal on Track (progressing): YES.) Long Term Goal  Reviewed medications with patient, updated EMR Advised patient to self assesses COPD action plan zone and make appointment with provider if in the yellow zone for 48 hours without improvement Provided education about and advised patient to utilize infection prevention strategies to reduce risk of respiratory infection Discussed the importance of adequate rest and management of fatigue with COPD Discussed triggers and avoiding being around fires and fumes  Discussed the importance of handwashing and wearing a mask in public, explained the importance of infection prevention Reviewed upcoming appointments including 03/01/23 for CT and hospital follow up with PCP on 03/03/23 Advised patient to work quitting drinking alcohol Assisted with scheduling GI consult-secure message to C. Harris for scheduling Assisted with scheduling transportation to [facility] on 03/01/23, pick up 1:40pm and return ride home at 4pm; to PCP on 03/03/23, pick up at 10:10am and return ride home at 1pm-patient physically wrote down details Reviewed recent hospitalization and  discussed Medication review, collaboration with pharmacy regarding discharge medications from recent hospitalization-medications were sent out yesterday, out for delivery today Ensured patient received new oxygen concentrator   Patient Goals/Self-Care Activities: Take medications as prescribed   Attend all scheduled provider appointments Call pharmacy for medication refills 3-7 days in advance of running out of medications Perform all self care activities independently  Call provider office for new concerns or questions  - avoid second hand smoke - limit outdoor activity during cold weather - keep follow-up appointments: 01/26/23 with Dr. Ralene Cork - eat healthy/prescribed diet: DASH - get at least 7 to 8 hours of sleep at night       Follow Up:  Patient agrees to Care Plan and Follow-up.  Plan: The Managed Medicaid care management team will reach out to the patient again over the next 30 days.  Date/time of next scheduled RN care management/care coordination outreach:  03/30/23 at  10:30am  [name] RN, BSN [place]  Value-Based [facility] RN Care Manager [phone]

## 2023-02-24 NOTE — Patient Instructions (Signed)
Visit Information  Mr. Dues was given information about Medicaid Managed Care team care coordination services as a part of their Healthy [facility] Medicaid benefit. [name] verbally consented to engagement with the [facility] Managed Care team.   If you are experiencing a medical emergency, please call 911 or report to your local emergency department or urgent care.   If you have a non-emergency medical problem during routine business hours, please contact your provider's office and ask to speak with a nurse.   For questions related to your Healthy [facility] health plan, please call: [phone] or visit the homepage here: [email or web address]  If you would like to schedule transportation through your Healthy [facility] plan, please call the following number at least 2 days in advance of your appointment: [phone]  For information about your ride after you set it up, call Ride Assist at [phone]. Use this number to activate a Will Call pickup, or if your transportation is late for a scheduled pickup. Use this number, too, if you need to make a change or cancel a previously scheduled reservation.  If you need transportation services right away, call [phone]. The after-hours call center is staffed 24 hours to handle ride assistance and urgent reservation requests (including discharges) 365 days a year. Urgent trips include sick visits, hospital discharge requests and life-sustaining treatment.  Call the [facility] Line at [phone], at any time, 24 hours a day, 7 days a week. If you are in danger or need immediate medical attention call 911.  If you would like help to quit smoking, call 1-800-QUIT-NOW (8624399373) OR Espaol: 1-855-Djelo-Ya (4-742-595-6387) o para ms informacin haga clic aqu or Text READY to 564-332 to register via text  Mr. Charters,   Please see education materials related to respiratory failure  provided as print materials.   The patient verbalized understanding of instructions, educational materials, and care plan provided today and agreed to receive a mailed copy of patient instructions, educational materials, and care plan.   Telephone follow up appointment with Managed Medicaid care management team member scheduled for:  [name] RN, BSN [place]  Value-Based Care [facility] RN Care Manager [phone]   Following is a copy of your plan of care:  Care Plan : RN Care Manager Plan of Care  Updates made by [name], RN since 02/24/2023 12:00 AM     Problem: Chronic Disease Management needs in patient with COPD   Priority: High     Long-Range Goal: Plan of care established for for Chronic Disease management for patient with COPD   Start Date: 11/13/2020  Expected End Date: 03/04/2023  Priority: High  Note:   Current Barriers:  Care Coordination needs related to Limited access to food  Chronic Disease Management support and education needs related to COPD    RNCM Clinical Goal(s):  Patient will verbalize understanding of plan for management of COPD as evidenced by taking all medications as prescribed, attending all scheduled appointments and contacting provider with questions and concerns take all medications exactly as prescribed and will call provider for medication related questions as evidenced by refilling medications and taking as prescribed    attend all scheduled medical appointments: [facility] on 03/01/23 and PCP on 03/03/23 as evidenced by provider notes in EMR        demonstrate improved adherence to prescribed treatment plan for COPD as evidenced by taking all medications as prescribed, attending all scheduled appointments and contacting provider with questions and concerns  continue to work with Medical illustrator and/or Social Worker to address care management and care coordination needs related to COPD as evidenced by  adherence to CM Team Scheduled appointments     through collaboration with Medical illustrator, provider, and care team.   Interventions: Inter-disciplinary care team collaboration (see longitudinal plan of care) Evaluation of current treatment plan related to  self management and patient's adherence to plan as established by provider  Ensured patient received provided list of provider contact information and address to each office Discussed the importance of patient learning to make and reschedule appointments and arrange transportation independently Provided encouragement   COPD: (Status: Goal on Track (progressing): YES.) Long Term Goal  Reviewed medications with patient, updated EMR Advised patient to self assesses COPD action plan zone and make appointment with provider if in the yellow zone for 48 hours without improvement Provided education about and advised patient to utilize infection prevention strategies to reduce risk of respiratory infection Discussed the importance of adequate rest and management of fatigue with COPD Discussed triggers and avoiding being around fires and fumes  Discussed the importance of handwashing and wearing a mask in public, explained the importance of infection prevention Reviewed upcoming appointments including 03/01/23 for CT and hospital follow up with PCP on 03/03/23 Advised patient to work quitting drinking alcohol Assisted with scheduling GI consult-secure message to C. Harris for scheduling Assisted with scheduling transportation to [name] on 03/01/23, pick up 1:40pm and return ride home at 4pm; to PCP on 03/03/23, pick up at 10:10am and return ride home at 1pm-patient physically wrote down details Reviewed recent hospitalization and discussed Medication review, collaboration with pharmacy regarding discharge medications from recent hospitalization-medications were sent out yesterday, out for delivery today Ensured patient received new oxygen  concentrator   Patient Goals/Self-Care Activities: Take medications as prescribed   Attend all scheduled provider appointments Call pharmacy for medication refills 3-7 days in advance of running out of medications Perform all self care activities independently  Call provider office for new concerns or questions  - avoid second hand smoke - limit outdoor activity during cold weather - keep follow-up appointments: 01/26/23 with Dr. Ralene Cork - eat healthy/prescribed diet: DASH - get at least 7 to 8 hours of sleep at night

## 2023-03-01 ENCOUNTER — Ambulatory Visit
Admission: RE | Admit: 2023-03-01 | Discharge: 2023-03-01 | Disposition: A | Discharge status: Home / Self Care | Payer: Medicaid Other | Source: Ambulatory Visit | Attending: Acute Care | Admitting: Acute Care

## 2023-03-01 DIAGNOSIS — I700 Atherosclerosis of aorta: Secondary | ICD-10-CM | POA: Diagnosis not present

## 2023-03-01 DIAGNOSIS — J439 Emphysema, unspecified: Secondary | ICD-10-CM | POA: Diagnosis not present

## 2023-03-01 DIAGNOSIS — R911 Solitary pulmonary nodule: Secondary | ICD-10-CM | POA: Diagnosis not present

## 2023-03-01 DIAGNOSIS — R918 Other nonspecific abnormal finding of lung field: Secondary | ICD-10-CM

## 2023-03-02 NOTE — Progress Notes (Unsigned)
 Patient ID: James [name], male   DOB: 02-01-1960, 63 y.o.   MRN: [id number]   Recommendations for Outpatient Follow-up:  Follow up with PCP in 1-2 weeks. Please obtain BMP/CBC in one week. Advised to take doxycycline 100 mg bid for three days for COPD EXACERBATION. Advised to take prednisone 40 mg daily for three days for COPD.  Brief Summary/ Hospital Course: This 63 yrs old male with medical history significant for COPD, chronic diastolic heart failure, hypertension, type 2 diabetes presenting with shortness of breath with exertion and worsening productive cough. He has been adherent with his home Breztri and albuterol inhalers and has been using it without any improvement in his symptoms. He is not on home oxygen and denies any sick contacts.Patient was significantly hypoxic requiring up to 10 L of supplemental oxygen on arrival in the ED.  Patient was given Solu-Medrol , magnesium and DuoNeb nebulization.  Chest x-ray shows emphysema,  chronic bronchitic changes,  no obvious focal consolidation noted.  Patient was admitted for COPD exacerbation.Patient was continued on IV Solu-Medrol and scheduled and as needed DuoNeb nebulizations.  Patient has made significant improvement,  He successfully weaned down to room air.  Patient feels much improved and wants to be discharged.  Patient is being discharged on doxycycline and prednisone for 3 more days.   Discharge Diagnoses:  Active Problems:   COPD with acute exacerbation (HCC)   Acute hypoxic respiratory failure: COPD exacerbation: Patient presented with worsening shortness of breath on exertion and productive cough. He was significantly hypoxic, requiring up to 10 L oxygen via Venturi mask but appears to be improved . VBG without hypercarbia and chest x-ray without any acute infection.   He is on [place] and as needed albuterol at home.  He follows [place] pulmonology as an outpatient. Continue Dulera and Incruse Ellipta in place of home  Breztri Continue DuoNebs every 4 hours as needed. Continue doxycycline for 5 days Continue Prednisone 40 mg daily for 4 more days Respiratory viral panel, COVID, flu, RSV negative. Continue Supplemental oxygen as needed to maintain O2 sats greater than 88% Flutter valve, incentive spirometry Patient successfully weaned down to room air. Feels better and wants to be discharged.   Chronic diastolic CHF: Last ECHO 11/2022 with recovered EF to 55-60%, no RWMA.  He appears euvolemic on exam and BNP normal Resume home Lasix, losartan, Toprol-XL   Essential HTN: Continue losartan 100 mg daily Continue Toprol-XL 50 mg daily at home   DM II: HbA1c 6.2% in 12/2022 CBG this a.m. at 343, likely elevated in the setting of receiving steroids for COPD exacerbation Continue  moderate SSI Carb modified diet -trend CBGs, goal 140-180   Alcohol use disorder: Patient is daily alcohol user, last drink yesterday.  Continue CIWA without ativan for now. CIWA without ativan TOC consulted for substance use resources.   Hyperlipidemia: Continue Lipitor 10 mg daily.   GERD: Continue Protonix 40 mg daily.

## 2023-03-03 ENCOUNTER — Ambulatory Visit: Payer: Medicaid Other | Attending: Physician Assistant | Admitting: Physician Assistant

## 2023-03-03 ENCOUNTER — Encounter: Payer: Self-pay | Admitting: Physician Assistant

## 2023-03-03 ENCOUNTER — Other Ambulatory Visit: Payer: Self-pay

## 2023-03-03 VITALS — BP 108/73 | HR 83 | Ht 69.0 in | Wt 194.6 lb

## 2023-03-03 DIAGNOSIS — F1090 Alcohol use, unspecified, uncomplicated: Secondary | ICD-10-CM | POA: Diagnosis not present

## 2023-03-03 DIAGNOSIS — E1141 Type 2 diabetes mellitus with diabetic mononeuropathy: Secondary | ICD-10-CM | POA: Diagnosis not present

## 2023-03-03 DIAGNOSIS — I10 Essential (primary) hypertension: Secondary | ICD-10-CM

## 2023-03-03 DIAGNOSIS — Z09 Encounter for follow-up examination after completed treatment for conditions other than malignant neoplasm: Secondary | ICD-10-CM

## 2023-03-03 DIAGNOSIS — J9691 Respiratory failure, unspecified with hypoxia: Secondary | ICD-10-CM | POA: Diagnosis not present

## 2023-03-03 DIAGNOSIS — J9611 Chronic respiratory failure with hypoxia: Secondary | ICD-10-CM

## 2023-03-03 DIAGNOSIS — M25461 Effusion, right knee: Secondary | ICD-10-CM | POA: Diagnosis not present

## 2023-03-03 DIAGNOSIS — J449 Chronic obstructive pulmonary disease, unspecified: Secondary | ICD-10-CM

## 2023-03-03 DIAGNOSIS — I152 Hypertension secondary to endocrine disorders: Secondary | ICD-10-CM

## 2023-03-03 MED ORDER — LOSARTAN POTASSIUM 100 MG PO TABS
100.0000 mg | ORAL_TABLET | Freq: Every day | ORAL | 2 refills | Status: AC
  Filled 2023-03-03 – 2023-05-04 (×2): qty 90, 90d supply, fill #0
  Filled 2023-08-08: qty 90, 90d supply, fill #1
  Filled 2023-11-16 (×2): qty 90, 90d supply, fill #2

## 2023-03-03 MED ORDER — ALBUTEROL SULFATE HFA 108 (90 BASE) MCG/ACT IN AERS
2.0000 | INHALATION_SPRAY | Freq: Four times a day (QID) | RESPIRATORY_TRACT | 1 refills | Status: DC | PRN
  Filled 2023-03-03 (×2): qty 54, 75d supply, fill #0
  Filled 2023-08-08: qty 54, 75d supply, fill #1

## 2023-03-03 MED ORDER — PANTOPRAZOLE SODIUM 40 MG PO TBEC
40.0000 mg | DELAYED_RELEASE_TABLET | Freq: Every day | ORAL | 1 refills | Status: DC
  Filled 2023-03-03 (×2): qty 90, 90d supply, fill #0
  Filled 2023-06-02 (×2): qty 90, 90d supply, fill #1

## 2023-03-03 MED ORDER — FUROSEMIDE 40 MG PO TABS
40.0000 mg | ORAL_TABLET | Freq: Every day | ORAL | 1 refills | Status: DC
  Filled 2023-03-03 – 2023-04-12 (×2): qty 90, 90d supply, fill #0
  Filled 2023-07-12: qty 90, 90d supply, fill #1

## 2023-03-03 MED ORDER — LORAZEPAM 0.5 MG PO TABS: ORAL_TABLET | ORAL | 0 refills | Status: DC

## 2023-03-03 MED ORDER — ALBUTEROL SULFATE (2.5 MG/3ML) 0.083% IN NEBU
2.5000 mg | INHALATION_SOLUTION | RESPIRATORY_TRACT | 1 refills | Status: AC | PRN
Start: 2023-03-03 — End: ?
  Filled 2023-03-03 – 2023-04-22 (×3): qty 270, 15d supply, fill #0
  Filled 2023-09-27: qty 270, 15d supply, fill #1

## 2023-03-03 MED ORDER — COLCHICINE 0.6 MG PO TABS
0.6000 mg | ORAL_TABLET | Freq: Two times a day (BID) | ORAL | 0 refills | Status: DC
  Filled 2023-03-03 (×2): qty 10, 5d supply, fill #0

## 2023-03-03 MED ORDER — LORAZEPAM 0.5 MG PO TABS
ORAL_TABLET | ORAL | 0 refills | Status: AC
  Filled 2023-03-03: qty 30, 23d supply, fill #0

## 2023-03-03 NOTE — Patient Instructions (Addendum)
 [email or web address] [email or web address] is the website for narcotics anonymous [employer].cy (website) or 614-228-5633 is the information for alcoholics anonymous  Call michael [phone] and tell him Sabien Umland gave you his name.  He can help you figure out rides to meetings  Both are free and immediately available for help with alcohol and drug use

## 2023-03-04 ENCOUNTER — Other Ambulatory Visit: Payer: Self-pay

## 2023-03-05 DIAGNOSIS — J9621 Acute and chronic respiratory failure with hypoxia: Secondary | ICD-10-CM | POA: Diagnosis not present

## 2023-03-05 DIAGNOSIS — J9601 Acute respiratory failure with hypoxia: Secondary | ICD-10-CM | POA: Diagnosis not present

## 2023-03-05 DIAGNOSIS — F79 Unspecified intellectual disabilities: Secondary | ICD-10-CM | POA: Diagnosis not present

## 2023-03-07 DIAGNOSIS — F79 Unspecified intellectual disabilities: Secondary | ICD-10-CM | POA: Diagnosis not present

## 2023-03-07 DIAGNOSIS — J9601 Acute respiratory failure with hypoxia: Secondary | ICD-10-CM | POA: Diagnosis not present

## 2023-03-07 DIAGNOSIS — J9621 Acute and chronic respiratory failure with hypoxia: Secondary | ICD-10-CM | POA: Diagnosis not present

## 2023-03-08 ENCOUNTER — Other Ambulatory Visit: Payer: Self-pay | Admitting: *Deleted

## 2023-03-08 NOTE — Patient Instructions (Signed)
 Visit Information  James [name] was given information about Medicaid Managed Care team care coordination services as a part of their Healthy [facility] Medicaid benefit. James [name] verbally consented to engagement with the [facility] Managed Care team.   If you are experiencing a medical emergency, please call 911 or report to your local emergency department or urgent care.   If you have a non-emergency medical problem during routine business hours, please contact your provider's office and ask to speak with a nurse.   For questions related to your Healthy [facility] health plan, please call: [phone] or visit the homepage here: [email or web address]  If you would like to schedule transportation through your Healthy [facility] plan, please call the following number at least 2 days in advance of your appointment: [phone]  For information about your ride after you set it up, call Ride Assist at [phone]. Use this number to activate a Will Call pickup, or if your transportation is late for a scheduled pickup. Use this number, too, if you need to make a change or cancel a previously scheduled reservation.  If you need transportation services right away, call [phone]. The after-hours call center is staffed 24 hours to handle ride assistance and urgent reservation requests (including discharges) 365 days a year. Urgent trips include sick visits, hospital discharge requests and life-sustaining treatment.  Call the [facility] Line at [phone], at any time, 24 hours a day, 7 days a week. If you are in danger or need immediate medical attention call 911.  If you would like help to quit smoking, call 1-800-QUIT-NOW (639-214-9716) OR Espaol: 1-855-Djelo-Ya (3-016-010-9323) o para ms informacin haga clic aqu or Text READY to 557-322 to register via text  James [name],   Please see education materials related to alcohol provided as  print materials.   The patient verbalized understanding of instructions, educational materials, and care plan provided today and agreed to receive a mailed copy of patient instructions, educational materials, and care plan.   Telephone follow up appointment with Managed Medicaid care management team member scheduled for:03/30/23 at 10:30pm  [name] RN, BSN   Value-Based Care Institute [facility] Health RN Care Manager [phone]   Following is a copy of your plan of care:  Care Plan : RN Care Manager Plan of Care  Updates made by [name], RN since 03/08/2023 12:00 AM     Problem: Chronic Disease Management needs in patient with COPD   Priority: High     Long-Range Goal: Plan of care established for for Chronic Disease management for patient with COPD   Start Date: 11/13/2020  Expected End Date: 03/04/2023  Priority: High  Note:   Current Barriers:  Care Coordination needs related to Limited access to food  Chronic Disease Management support and education needs related to COPD    RNCM Clinical Goal(s):  Patient will verbalize understanding of plan for management of COPD as evidenced by taking all medications as prescribed, attending all scheduled appointments and contacting provider with questions and concerns take all medications exactly as prescribed and will call provider for medication related questions as evidenced by refilling medications and taking as prescribed    attend all scheduled medical appointments: Orthopedics on 03/14/23 and Dermatology on 04/06/23 as evidenced by provider notes in EMR        demonstrate improved adherence to prescribed treatment plan for COPD as evidenced by taking all medications as prescribed, attending all scheduled appointments and contacting provider with questions and concerns  continue to work with Medical illustrator and/or Social Worker to address care management and care coordination needs related to COPD as evidenced by  adherence to CM Team Scheduled appointments     through collaboration with Medical illustrator, provider, and care team.   Interventions: Inter-disciplinary care team collaboration (see longitudinal plan of care) Evaluation of current treatment plan related to  self management and patient's adherence to plan as established by provider  Ensured patient received provided list of provider contact information and address to each office Discussed the importance of patient learning to make and reschedule appointments and arrange transportation independently Provided encouragement   COPD: (Status: Goal on Track (progressing): YES.) Long Term Goal  Reviewed medications with patient, updated EMR Advised patient to self assesses COPD action plan zone and make appointment with provider if in the yellow zone for 48 hours without improvement Provided education about and advised patient to utilize infection prevention strategies to reduce risk of respiratory infection Discussed the importance of adequate rest and management of fatigue with COPD Discussed triggers and avoiding being around fires and fumes  Discussed the importance of handwashing and wearing a mask in public, explained the importance of infection prevention Advised patient to work quitting drinking alcohol Assisted with scheduling GI consult-secure message to C. Harris for scheduling Assisted with scheduling transportation to [facility] on 03/14/23, pick up 1pm and return ride home at [place], ref [phone]; to Dermatology on 04/06/23, pick up at 1:45pm and return ride home at 3:30pm, ref #[id number]-patient physically wrote down details   Patient Goals/Self-Care Activities: Take medications as prescribed   Attend all scheduled provider appointments Call pharmacy for medication refills 3-7 days in advance of running out of medications Perform all self care activities independently  Call provider office for new concerns or questions  - avoid  second hand smoke - limit outdoor activity during cold weather - keep follow-up appointments: 01/26/23 with Dr. Ralene Cork - eat healthy/prescribed diet: DASH - get at least 7 to 8 hours of sleep at night

## 2023-03-08 NOTE — Patient Outreach (Addendum)
 Medicaid Managed Care   Nurse Care Manager Note  03/08/2023 Name:  James [name] MRN:  [id number] DOB:  12-23-1960  James [name] is an 62 y.o. year old male who is a primary patient of Marcine Matar, MD.  The [facility] Managed Care Coordination team was consulted for assistance with:    COPD  James [name] was given information about Medicaid Managed Care Coordination team services today. James [name] Patient agreed to services and verbal consent obtained.  Engaged with patient by telephone for follow up visit in response to provider referral for case management and/or care coordination services.   Patient is participating in a Managed Medicaid Plan:  Yes  Assessments/Interventions:  Review of past medical history, allergies, medications, health status, including review of consultants reports, laboratory and other test data, was performed as part of comprehensive evaluation and provision of chronic care management services.  SDOH (Social Drivers of Health) assessments and interventions performed: SDOH Interventions    Flowsheet Row Patient Outreach Telephone from 02/24/2023 in [place] POPULATION HEALTH DEPARTMENT Patient Outreach Telephone from 12/23/2022 in [place] POPULATION HEALTH DEPARTMENT Patient Outreach Telephone from 10/13/2022 in [place] POPULATION HEALTH DEPARTMENT Telephone from 07/16/2022 in [place] POPULATION HEALTH DEPARTMENT Patient Outreach Telephone from 06/30/2022 in [place] POPULATION HEALTH DEPARTMENT Patient Outreach Telephone from 05/25/2022 in [place] POPULATION HEALTH DEPARTMENT  SDOH Interventions        Food Insecurity Interventions Intervention Not Indicated -- -- -- -- --  Housing Interventions -- -- -- -- -- Intervention Not Indicated  Transportation Interventions -- --  [Assisted patient with arranging transportation to upcoming appointment] Payor Benefit Payor Benefit Payor Benefit  [assisted with arranging transportation] Payor Benefit   [Assisted with arranging transportation to upcoming appointments]       Care Plan  No Known Allergies  Medications Reviewed Today   Medications were not reviewed in this encounter     Patient Active Problem List   Diagnosis Date Noted   COPD with acute exacerbation (HCC) 02/16/2023   COPD exacerbation (HCC) 11/22/2022   Dysphagia 10/01/2022   Alcohol dependence in remission (HCC) 03/02/2022   Aortic root dilation (HCC) 03/02/2022   Vision changes 12/29/2021   Oropharyngeal dysphagia 12/29/2021   Onychomycosis 12/29/2021   Malnutrition of moderate degree 12/05/2021   Tinea cruris 07/21/2021   Multiple lung nodules on CT 06/11/2021   COPD with chronic bronchitis (HCC) 02/01/2021   GERD (gastroesophageal reflux disease) 02/01/2021   Neuropathy 09/29/2020   Bronchiectasis (HCC) 09/24/2019   Type 2 diabetes mellitus (HCC) 07/24/2019   Other atopic dermatitis 05/01/2019   Acute on chronic respiratory failure (HCC) 04/23/2019   Intellectual disability 04/23/2019   HTN (hypertension) 05/17/2016   Former tobacco use 04/06/2016   COPD mixed type (HCC) 03/26/2016    Conditions to be addressed/monitored per PCP order:  COPD  Care Plan : RN Care Manager Plan of Care  Updates made by [name], RN since 03/08/2023 12:00 AM     Problem: Chronic Disease Management needs in patient with COPD   Priority: High     Long-Range Goal: Plan of care established for for Chronic Disease management for patient with COPD   Start Date: 11/13/2020  Expected End Date: 03/04/2023  Priority: High  Note:   Current Barriers:  Care Coordination needs related to Limited access to food  Chronic Disease Management support and education needs related to COPD    RNCM Clinical Goal(s):  Patient will verbalize understanding of  plan for management of COPD as evidenced by taking all medications as prescribed, attending all scheduled appointments and contacting provider with questions and  concerns take all medications exactly as prescribed and will call provider for medication related questions as evidenced by refilling medications and taking as prescribed    attend all scheduled medical appointments: Orthopedics on 03/14/23 and Dermatology on 04/06/23 as evidenced by provider notes in EMR        demonstrate improved adherence to prescribed treatment plan for COPD as evidenced by taking all medications as prescribed, attending all scheduled appointments and contacting provider with questions and concerns continue to work with RN Care Manager and/or Social Worker to address care management and care coordination needs related to COPD as evidenced by adherence to CM Team Scheduled appointments     through collaboration with Medical illustrator, provider, and care team.   Interventions: Inter-disciplinary care team collaboration (see longitudinal plan of care) Evaluation of current treatment plan related to  self management and patient's adherence to plan as established by provider  Ensured patient received provided list of provider contact information and address to each office Discussed the importance of patient learning to make and reschedule appointments and arrange transportation independently Provided encouragement   COPD: (Status: Goal on Track (progressing): YES.) Long Term Goal  Reviewed medications with patient, updated EMR Advised patient to self assesses COPD action plan zone and make appointment with provider if in the yellow zone for 48 hours without improvement Provided education about and advised patient to utilize infection prevention strategies to reduce risk of respiratory infection Discussed the importance of adequate rest and management of fatigue with COPD Discussed triggers and avoiding being around fires and fumes  Discussed the importance of handwashing and wearing a mask in public, explained the importance of infection prevention Advised patient to work quitting  drinking alcohol Assisted with scheduling GI consult-secure message to C. Harris for scheduling Assisted with scheduling transportation to [facility] on 03/14/23, pick up 1pm and return ride home at [place], ref [phone]; to Dermatology on 04/06/23, pick up at 1:45pm and return ride home at 3:30pm, ref #[id number]-patient physically wrote down details   Patient Goals/Self-Care Activities: Take medications as prescribed   Attend all scheduled provider appointments Call pharmacy for medication refills 3-7 days in advance of running out of medications Perform all self care activities independently  Call provider office for new concerns or questions  - avoid second hand smoke - limit outdoor activity during cold weather - keep follow-up appointments: 01/26/23 with Dr. Ralene Cork - eat healthy/prescribed diet: DASH - get at least 7 to 8 hours of sleep at night       Follow Up:  Patient agrees to Care Plan and Follow-up.  Plan: The Managed Medicaid care management team will reach out to the patient again over the next 30 days.  Date/time of next scheduled RN care management/care coordination outreach:  03/30/23 at 10:30pm  [name] RN, BSN [place]  Value-Based Care Institute [name] Health RN Care Manager [phone]

## 2023-03-14 ENCOUNTER — Other Ambulatory Visit (INDEPENDENT_AMBULATORY_CARE_PROVIDER_SITE_OTHER): Payer: Self-pay

## 2023-03-14 ENCOUNTER — Other Ambulatory Visit (HOSPITAL_COMMUNITY): Payer: Self-pay

## 2023-03-14 ENCOUNTER — Other Ambulatory Visit: Payer: Self-pay

## 2023-03-14 ENCOUNTER — Ambulatory Visit (INDEPENDENT_AMBULATORY_CARE_PROVIDER_SITE_OTHER): Admitting: Physician Assistant

## 2023-03-14 ENCOUNTER — Encounter: Payer: Self-pay | Admitting: Physician Assistant

## 2023-03-14 DIAGNOSIS — M25561 Pain in right knee: Secondary | ICD-10-CM | POA: Insufficient documentation

## 2023-03-14 MED ORDER — METHYLPREDNISOLONE ACETATE 40 MG/ML IJ SUSP
40.0000 mg | INTRAMUSCULAR | Status: AC | PRN
  Administered 2023-03-14: 40 mg via INTRA_ARTICULAR

## 2023-03-14 MED ORDER — MELOXICAM 7.5 MG PO TABS
7.5000 mg | ORAL_TABLET | Freq: Every day | ORAL | 0 refills | Status: DC
  Filled 2023-03-14 (×2): qty 30, 30d supply, fill #0

## 2023-03-14 MED ORDER — LIDOCAINE HCL 1 % IJ SOLN
4.0000 mL | INTRAMUSCULAR | Status: AC | PRN
  Administered 2023-03-14: 4 mL

## 2023-03-14 NOTE — Progress Notes (Signed)
 Office Visit Note   [name]           Date of Birth: 15-Aug-1960           MRN: [id number] Visit Date: 03/14/2023              Requested by: Anders Simmonds, PA-C [address] [place],  [place] [address] PCP: Marcine Matar, MD   Assessment & Plan: Visit Diagnoses:  1. Right knee pain, unspecified chronicity     Plan: Patient with a 1 week history of right knee pain and swelling.  No evidence of infection he has a mild effusion.  He does have some degenerative changes more on the lateral side where he had previous surgery.  Did go forward and gave him attempted aspiration was not able to aspirate any fluid but did give him a steroid injection.  Can follow-up with me in 2 weeks  Follow-Up Instructions: 2 weeks  Orders:  Orders Placed This Encounter  Procedures   XR KNEE 3 VIEW RIGHT   No orders of the defined types were placed in this encounter.     Procedures: Large Joint Inj: R knee on 03/14/2023 2:33 PM Indications: pain and diagnostic evaluation Details: 1.5 in superolateral approach  Arthrogram: No  Medications: 40 mg methylPREDNISolone acetate 40 MG/ML; 4 mL lidocaine 1 % Outcome: tolerated well, no immediate complications Procedure, treatment alternatives, risks and benefits explained, specific risks discussed. Consent was given by the patient.       Clinical Data: No additional findings.   Subjective: Chief Complaint  Patient presents with   Right Knee - Pain    HPI patient is a pleasant 63 year old gentleman with a chief complaint of right knee pain.  He reported that his right knee started swelling last Wednesday hurts him quite a bit says it feels like a sharp pain hurts to walk and bend not currently taking any medication no particular history of injury  Review of Systems  All other systems reviewed and are negative.    Objective: Vital Signs: There were no vitals taken for this visit.  Physical  Exam Constitutional:      Appearance: Normal appearance.  Pulmonary:     Effort: Pulmonary effort is normal.  Skin:    General: Skin is warm and dry.  Neurological:     General: No focal deficit present.     Mental Status: He is alert and oriented to person, place, and time.  Psychiatric:        Mood and Affect: Mood normal.        Behavior: Behavior normal.     Ortho Exam Right knee mild effusion no redness no erythema compartments are soft and compressible he is neurovascular intact he has good extension and flexion of his leg.  Ligamentous structures are intact Specialty Comments:  No specialty comments available.  Imaging: No results found.   PMFS History: Patient Active Problem List   Diagnosis Date Noted   Pain in right knee 03/14/2023   COPD with acute exacerbation (HCC) 02/16/2023   COPD exacerbation (HCC) 11/22/2022   Dysphagia 10/01/2022   Alcohol dependence in remission (HCC) 03/02/2022   Aortic root dilation (HCC) 03/02/2022   Vision changes 12/29/2021   Oropharyngeal dysphagia 12/29/2021   Onychomycosis 12/29/2021   Malnutrition of moderate degree 12/05/2021   Tinea cruris 07/21/2021   Multiple lung nodules on CT 06/11/2021   COPD with chronic bronchitis (HCC) 02/01/2021   GERD (gastroesophageal  reflux disease) 02/01/2021   Neuropathy 09/29/2020   Bronchiectasis (HCC) 09/24/2019   Type 2 diabetes mellitus (HCC) 07/24/2019   Other atopic dermatitis 05/01/2019   Acute on chronic respiratory failure (HCC) 04/23/2019   Intellectual disability 04/23/2019   HTN (hypertension) 05/17/2016   Former tobacco use 04/06/2016   COPD mixed type (HCC) 03/26/2016   Past Medical History:  Diagnosis Date   Acute on chronic respiratory failure with hypoxia (HCC) 06/08/2013   Acute on chronic respiratory failure with hypoxia and hypercapnia (HCC) 06/08/2013   AKI (acute kidney injury) (HCC) 07/12/2022   Alcohol use disorder 11/23/2018   CAP (community acquired  pneumonia) 05/17/2018   See admit 05/17/18 ? rml  with   covid pcr neg - rx augmentin > f/u cxr in 4-6 weeks is fine unless condition declines    Community acquired pneumonia 07/12/2021   Community acquired pneumonia of right lower lobe of lung 05/17/2018   See admit 05/17/18 ? 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   Recurrent pneumonia 02/20/2021   Tick bite 06/03/2018    Family History  Problem Relation Age of Onset   Heart disease Father    COPD Sister     Past Surgical History:  Procedure Laterality Date   ESOPHAGEAL MANOMETRY N/A 09/15/2022   Procedure: ESOPHAGEAL MANOMETRY (EM);  Surgeon: Hilarie Fredrickson, MD;  Location: [facility];  Service: Gastroenterology;  Laterality: N/A;   LEFT HEART CATH AND CORONARY ANGIOGRAPHY N/A 12/07/2021   Procedure: LEFT HEART CATH AND CORONARY ANGIOGRAPHY;  Surgeon: Runell Gess, MD;  Location: [facility] INVASIVE CV LAB;  Service: Cardiovascular;  Laterality: N/A;   LUNG REMOVAL, PARTIAL     VIDEO ASSISTED THORACOSCOPY (VATS)/THOROCOTOMY Left 06/11/2013   Procedure: VIDEO ASSISTED THORACOSCOPY (VATS)/THOROCOTOMY;  Surgeon: Kerin Perna, MD;  Location: [facility] OR;  Service: Thoracic;  Laterality: Left;   VIDEO BRONCHOSCOPY N/A 06/11/2013   Procedure: VIDEO BRONCHOSCOPY;  Surgeon: Kerin Perna, MD;  Location: [facility] OR;  Service: Thoracic;  Laterality: N/A;   VIDEO BRONCHOSCOPY N/A 08/10/2018   Procedure: VIDEO BRONCHOSCOPY WITH FLUORO;  Surgeon: Lorin Glass, MD;  Location: [facility]  ENDOSCOPY;  Service: Endoscopy;  Laterality: N/A;   Social History   Occupational History   Occupation: Curator   Occupation: Education administrator  Tobacco Use   Smoking status: Former    Current packs/day: 0.00    Average packs/day: 1.5 packs/day for 40.0 years (60.0 ttl pk-yrs)    Types: Cigarettes    Start date: 69    Quit date: 2014    Years since quitting: 11.1   Smokeless tobacco: Former    Types: Engineer, drilling   Vaping status: Never Used  Substance and Sexual Activity   Alcohol use: Yes    Alcohol/week: 4.0 standard drinks of alcohol    Types: 4 Cans of beer per week    Comment: 4 beers a night previously   Drug use: Not Currently    Types: Marijuana    Comment:  daily; quit using crack and methamphetamine about 5 months ago    Sexual activity: Not Currently    Partners: Female

## 2023-03-15 ENCOUNTER — Other Ambulatory Visit: Payer: Self-pay

## 2023-03-15 ENCOUNTER — Other Ambulatory Visit (HOSPITAL_COMMUNITY): Payer: Self-pay

## 2023-03-22 ENCOUNTER — Other Ambulatory Visit (HOSPITAL_COMMUNITY): Payer: Self-pay

## 2023-03-22 ENCOUNTER — Other Ambulatory Visit: Payer: Self-pay

## 2023-03-23 ENCOUNTER — Other Ambulatory Visit: Payer: Self-pay

## 2023-03-24 ENCOUNTER — Encounter: Payer: Self-pay | Admitting: Internal Medicine

## 2023-03-24 ENCOUNTER — Other Ambulatory Visit: Payer: Self-pay

## 2023-03-24 ENCOUNTER — Other Ambulatory Visit (HOSPITAL_COMMUNITY): Payer: Self-pay

## 2023-03-24 ENCOUNTER — Other Ambulatory Visit: Payer: Self-pay | Admitting: Internal Medicine

## 2023-03-24 ENCOUNTER — Other Ambulatory Visit: Payer: Self-pay | Admitting: *Deleted

## 2023-03-24 MED ORDER — PREDNISONE 10 MG PO TABS
10.0000 mg | ORAL_TABLET | Freq: Every day | ORAL | 4 refills | Status: DC
Start: 2023-03-24 — End: 2023-09-12
  Filled 2023-03-24 (×2): qty 30, 30d supply, fill #0
  Filled 2023-04-20 (×2): qty 30, 30d supply, fill #1
  Filled 2023-05-18: qty 30, 30d supply, fill #2
  Filled 2023-06-16 (×2): qty 30, 30d supply, fill #3
  Filled 2023-07-19: qty 30, 30d supply, fill #4

## 2023-03-24 NOTE — Patient Outreach (Signed)
 Care Coordination  03/24/2023  James [name] 1960-11-23 [id number]  RNCM received message from [name] stating that he was out of prednisone and unable to get a refill. Instructed by Pharmacy to reach out to his PCP. [name] request assistance from [facility]. RNCM sent secure message to Dr. Timoteo Expose requesting maintenance dose of prednisone prescription tp be sent to pharmacy. Order placed by PCP. RNCM attempting to reach [name], unable to leave a message. RNCM contacted DPR/Theresa. DPR will have patient contact pharmacy to fill prescription. Patient will pick up or request delivery. RNCM will follow up with [name] at next scheduled telephone outreach on 03/30/23.  [name] RN, BSN [place]  Value-Based Care Institute [facility] Health RN Care Manager [phone]

## 2023-03-28 ENCOUNTER — Ambulatory Visit: Admitting: Physician Assistant

## 2023-03-30 ENCOUNTER — Other Ambulatory Visit: Payer: Self-pay | Admitting: *Deleted

## 2023-03-30 NOTE — Patient Outreach (Signed)
  Medicaid Managed Care   Unsuccessful Attempt Note   03/30/2023 Name: AVEION NGUYEN MRN: [id number] DOB: [birth year]  Referred by: Marcine Matar, MD Reason for referral : High Risk Managed Medicaid (Unsuccessful RNCM follow up telephone outreach)   An unsuccessful telephone outreach was attempted today. The patient was referred to the case management team for assistance with care management and care coordination.    Follow Up Plan: The Managed Medicaid care management team will reach out to the patient again over the next 7 days.    [name] RN, BSN [place]  Value-Based Care Institute [facility] Health RN Care Manager [phone]

## 2023-03-30 NOTE — Patient Instructions (Signed)
 Visit Information  [name]  - as a part of your Medicaid benefit, you are eligible for care management and care coordination services at no cost or copay. I was unable to reach you by phone today but would be happy to help you with your health related needs. Please feel free to call me @ [phone].   A member of the Managed Medicaid care management team will reach out to you again over the next 7 days.   [name] RN, BSN [place]  Value-Based Care Institute [facility] Health RN Care Manager [phone]

## 2023-04-04 ENCOUNTER — Telehealth: Payer: Self-pay | Admitting: *Deleted

## 2023-04-04 NOTE — Patient Outreach (Signed)
  Care Management   Note  04/04/2023 Name: James [name] MRN: [id number] DOB: 05/17/1960  James [name] is enrolled in a Managed Medicaid plan: Yes. Outreach attempt today was unsuccessful.   The care management team will reach out to the patient again over the next 7 days.   [name] RN, BSN [place]  Value-Based Care Institute [facility] Health RN Care Manager [phone]

## 2023-04-05 ENCOUNTER — Telehealth: Payer: Self-pay | Admitting: *Deleted

## 2023-04-05 DIAGNOSIS — J9621 Acute and chronic respiratory failure with hypoxia: Secondary | ICD-10-CM | POA: Diagnosis not present

## 2023-04-05 DIAGNOSIS — J9601 Acute respiratory failure with hypoxia: Secondary | ICD-10-CM | POA: Diagnosis not present

## 2023-04-05 DIAGNOSIS — F79 Unspecified intellectual disabilities: Secondary | ICD-10-CM | POA: Diagnosis not present

## 2023-04-05 NOTE — Patient Outreach (Signed)
  Care Management   Note  04/05/2023 Name: DEVEON KISIEL MRN: [id number] DOB: 07/06/1960  [name] is enrolled in a Managed Medicaid plan: Yes. Outreach attempt today was unsuccessful.   The care management team will reach out to the patient again over the next 7 days.   [name] RN, BSN [place]  Value-Based Care Institute [facility] Health RN Care Manager [phone]

## 2023-04-05 NOTE — Patient Outreach (Signed)
  Care Management   Note  04/05/2023 Name: James [name] MRN: [id number] DOB: 1960-03-15  [name] is enrolled in a Managed Medicaid plan: Yes. Outreach attempt today was successful.   RNCM returning call to patient. [name] shared that he had been having problems with his phone and request RNCM to call him in the morning.   Telephone follow up appointment with care management team member scheduled for:04/06/23 at 9:45am   [name] RN, BSN [place]  Value-Based Care Institute [facility] Health RN Care Manager [phone]

## 2023-04-06 ENCOUNTER — Ambulatory Visit: Payer: Medicaid Other | Admitting: Dermatology

## 2023-04-06 ENCOUNTER — Other Ambulatory Visit: Payer: Self-pay

## 2023-04-06 ENCOUNTER — Other Ambulatory Visit: Payer: Self-pay | Admitting: *Deleted

## 2023-04-06 ENCOUNTER — Encounter: Payer: Self-pay | Admitting: Dermatology

## 2023-04-06 VITALS — BP 99/60 | HR 60

## 2023-04-06 DIAGNOSIS — B356 Tinea cruris: Secondary | ICD-10-CM | POA: Diagnosis not present

## 2023-04-06 DIAGNOSIS — B354 Tinea corporis: Secondary | ICD-10-CM | POA: Diagnosis not present

## 2023-04-06 MED ORDER — NYSTATIN-TRIAMCINOLONE 100000-0.1 UNIT/GM-% EX OINT
1.0000 | TOPICAL_OINTMENT | Freq: Two times a day (BID) | CUTANEOUS | 1 refills | Status: DC
  Filled 2023-04-06 – 2023-04-20 (×2): qty 30, 15d supply, fill #0

## 2023-04-06 MED ORDER — TERBINAFINE HCL 250 MG PO TABS
250.0000 mg | ORAL_TABLET | Freq: Every day | ORAL | Status: AC
Start: 2023-04-06 — End: 2023-05-06

## 2023-04-06 NOTE — Patient Instructions (Addendum)
 Visit Information  James [name] was given information about Medicaid Managed Care team care coordination services as a part of their Healthy [facility] Medicaid benefit. James [name] verbally consented to engagement with the [facility] Managed Care team.   If you are experiencing a medical emergency, please call 911 or report to your local emergency department or urgent care.   If you have a non-emergency medical problem during routine business hours, please contact your provider's office and ask to speak with a nurse.   For questions related to your Healthy [facility] health plan, please call: [phone] or visit the homepage here: [email or web address]  If you would like to schedule transportation through your Healthy [facility] plan, please call the following number at least 2 days in advance of your appointment: [phone]  For information about your ride after you set it up, call Ride Assist at [phone]. Use this number to activate a Will Call pickup, or if your transportation is late for a scheduled pickup. Use this number, too, if you need to make a change or cancel a previously scheduled reservation.  If you need transportation services right away, call [phone]. The after-hours call center is staffed 24 hours to handle ride assistance and urgent reservation requests (including discharges) 365 days a year. Urgent trips include sick visits, hospital discharge requests and life-sustaining treatment.  Call the [facility] Line at [phone], at any time, 24 hours a day, 7 days a week. If you are in danger or need immediate medical attention call 911.  If you would like help to quit smoking, call 1-800-QUIT-NOW ((959)656-7080) OR Espaol: 1-855-Djelo-Ya (2-951-884-1660) o para ms informacin haga clic aqu or Text READY to 630-160 to register via text  James [name],   Please see education materials related to COPD provided as print  materials.   The patient verbalized understanding of instructions, educational materials, and care plan provided today and agreed to receive a mailed copy of patient instructions, educational materials, and care plan.   Telephone follow up appointment with Managed Medicaid care management team member scheduled for:04/22/23 at 1pm  I am providing you with a list of doctors that you regularly see. Contact transportation at [phone] to schedule rides to upcoming appointments.  Dr. Jonah Blue- Primary Care Doctor    [facility]                                                       [address], [address]                                                       [place] [address]                                                       [phone]     Dr. Ralene Cork -Foot doctor             [facility]  [address]                                                 [place] [address]                                                 [phone]     Dr. Tonia Brooms and Dr. Margarito Liner doctor    [facility]                                                              [address]                                                             [place] [address]                                                             [phone]     Dr. Clovis Cao doctor                      [facility]                                                             [address]                                                            [place] [address]                                                            [phone]     Dr. Willodean Rosenthal doctor               [facility]  [address] [place], [place] [address]                                                         [phone]    [name] RN, BSN [place]  [facility] RN Care Manager [phone]   Following is a copy of your plan of care:  Care Plan : RN Care Manager Plan of Care  Updates made by [name], RN since 04/06/2023 12:00 AM     Problem: Chronic Disease Management needs in patient with COPD   Priority: High     Long-Range Goal: Plan of care established for for Chronic Disease management for patient with COPD   Start Date: 11/13/2020  Expected End Date: 03/04/2023  Priority: High  Note:   Current Barriers:  Care Coordination needs related to Limited access to food  Chronic Disease Management support and education needs related to COPD    RNCM Clinical Goal(s):  Patient will verbalize understanding of plan for management of COPD as evidenced by taking all medications as prescribed, attending all scheduled appointments and contacting provider with questions and concerns take all medications exactly as prescribed and will call provider for medication related questions as evidenced by refilling medications and taking as prescribed    attend all scheduled medical appointments:  Dermatology on 04/06/23, TFC on 04/27/23 and PCP on 04/28/23 as evidenced by provider notes in EMR        demonstrate improved adherence to prescribed treatment plan for COPD as evidenced by taking all medications as prescribed, attending all scheduled appointments and contacting provider with questions and concerns continue to work with RN Care Manager and/or Social Worker to address care management and care coordination needs related to COPD as evidenced by adherence to CM Team Scheduled appointments     through collaboration with RN Care manager, provider, and care team.   Interventions: Inter-disciplinary care team collaboration (see longitudinal plan of  care) Evaluation of current treatment plan related to  self management and patient's adherence to plan as established by provider  Ensured patient received provided list of provider contact information and address to each office Discussed the importance of patient learning to make and reschedule appointments and arrange transportation independently Provided encouragement Discussed warm transfer to new RNCM, scheduled telephone outreach on 04/22/23   COPD: (Status: Goal on Track (progressing): YES.) Long Term Goal  Reviewed medications with patient, updated EMR Advised patient to self assesses COPD action plan zone and make appointment with provider if in the yellow zone for 48 hours without improvement Provided education about and advised patient to utilize infection prevention strategies to reduce risk of respiratory infection Discussed the importance of adequate rest and management of fatigue with COPD Discussed triggers and avoiding being around fires and fumes  Discussed the importance of handwashing and wearing a mask in public, explained the importance of infection prevention Advised patient to work quitting drinking alcohol-down to a "couple of beers 1-2 nights  a week" Reviewed transportation details to Dermatology on 04/06/23, pick up at 1pm and return ride home at 3:30pm, ref #[id number]-patient physically wrote down details Assisted with scheduling transportation to [facility] on 04/27/23, pick up at 1:15pm and return ride home at 3:30pm, ref# [id number]; Dr. Laural Benes on 04/28/23, pick up at 9:30am and return ride home at 12:30pm Advised patient to call for prescriptions 3-5 days before running out Advised patient to discuss any concerns or questions with PCP   Patient Goals/Self-Care Activities: Take medications as prescribed   Attend all scheduled provider appointments Call pharmacy for medication refills 3-7 days in advance of running out of medications Perform all self care activities  independently  Call provider office for new concerns or questions  - avoid second hand smoke - limit outdoor activity during cold weather - keep follow-up appointments: 01/26/23 with Dr. Ralene Cork - eat healthy/prescribed diet: DASH - get at least 7 to 8 hours of sleep at night

## 2023-04-06 NOTE — Progress Notes (Addendum)
   New Patient Visit   Subjective  James [name] is a 63 y.o. male who presents for the following: rash Pt states he has rash on bilateral arms and groin for a couple years. Pt has used clobetasol  and triamcinolone  in the past and is currently using nystatin . He says the creams help for short periods of time. Pt complains of itchiness of 9 out of 10.  The following portions of the chart were reviewed this encounter and updated as appropriate: medications, allergies, medical history  Review of Systems:  No other skin or systemic complaints except as noted in HPI or Assessment and Plan.  Objective  Well appearing patient in no apparent distress; mood and affect are within normal limits.  A focused examination was performed of the following areas: arms  Relevant exam findings are noted in the Assessment and Plan.           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 Previous treatments with clobetasol  and triamcinolone  were not as effective as nystatin , which the patient has been using intermittently for about a year with some improvement. Patient reports a history of successful treatment with terbinafine  for groin symptoms. Recent liver function tests were normal, allowing for consideration of systemic antifungal therapy.  - Plan:    Prescribe terbinafine  250 mg orally once daily with dinner or fatty breakfast for 30 days    Prescribe nystatin -triamcinolone  ointment, to be applied twice daily for 2 weeks    Advise use of Desitin if prescribed cream causes burning or itching    Recommend daily or every other day cleansing with Dove antibacterial soap    One refill authorized for topical medication; no refills for oral medication   No follow-ups on file.  James [name], CMA, am acting as scribe for [employer], DO.   Documentation: I have reviewed the above documentation for accuracy and  completeness, and I agree with the above.  Delon Lenis, DO

## 2023-04-06 NOTE — Patient Outreach (Signed)
 Medicaid Managed Care   Nurse Care Manager Note  04/06/2023 Name:  James [name] MRN:  [id number] DOB:  1960-12-06  James [name] is an 63 y.o. year old male who is a primary patient of Marcine Matar, MD.  The [facility] Managed Care Coordination team was consulted for assistance with:    COPD  James [name] was given information about Medicaid Managed Care Coordination team services today. James [name] Patient agreed to services and verbal consent obtained.  Engaged with patient by telephone for follow up visit in response to provider referral for case management and/or care coordination services.   Patient is participating in a Managed Medicaid Plan:  Yes  Assessments/Interventions:  Review of past medical history, allergies, medications, health status, including review of consultants reports, laboratory and other test data, was performed as part of comprehensive evaluation and provision of chronic care management services.  SDOH (Social Drivers of Health) assessments and interventions performed: SDOH Interventions    Flowsheet Row Patient Outreach Telephone from 02/24/2023 in [place] [facility] POP[facility] HEALTH DEPARTMENT Patient Outreach Telephone from 12/23/2022 in [place] POPULATION HEALTH DEPARTMENT Patient Outreach Telephone from 10/13/2022 in [place] POPULATION HEALTH DEPARTMENT Telephone from 07/16/2022 in [place] POPULATION HEALTH DEPARTMENT Patient Outreach Telephone from 06/30/2022 in [place] POPULATION HEALTH DEPARTMENT Patient Outreach Telephone from 05/25/2022 in [place] POPULATION HEALTH DEPARTMENT  SDOH Interventions        Food Insecurity Interventions Intervention Not Indicated -- -- -- -- --  Housing Interventions -- -- -- -- -- Intervention Not Indicated  Transportation Interventions -- --  [Assisted patient with arranging transportation to upcoming appointment] Payor Benefit Payor Benefit Payor Benefit  [assisted with arranging transportation] Payor Benefit   [Assisted with arranging transportation to upcoming appointments]       Care Plan  No Known Allergies  Medications Reviewed Today     Reviewed by James Dach, RN (Registered Nurse) on 04/06/23 at 1005  Med List Status: <None>   Medication Order Taking? Sig Documenting Provider Last Dose Status Informant  albuterol (PROVENTIL) (2.5 MG/3ML) 0.083% nebulizer solution [id number] Yes Inhale 3 mLs (2.5 mg total) by nebulization every 4 (four) hours as needed for wheezing or shortness of breath. [name] M, PA-C Taking Active   albuterol (VENTOLIN HFA) 108 (90 Base) MCG/ACT inhaler [id number] Yes Inhale 2 puffs into the lungs every 6 (six) hours as needed for wheezing or shortness of breath. Anders Simmonds, PA-C Taking Active   atorvastatin (LIPITOR) 10 MG tablet [id number] Yes Take 1 tablet (10 mg total) by mouth daily. [name], MD Taking Active Self, Pharmacy Records  budeson-glycopyrrolate-formoterol [facility] AEROSPHERE) 160-9-4.8 MCG/ACT [name] [id number] Yes Inhale 2 puffs into the lungs in the morning and at bedtime. [name], MD Taking Active Self, Pharmacy Records           Med Note [name], [facility]   Tue Nov 02, 2022 10:18 AM)    colchicine 0.6 MG tablet [id number] No Take 1 tablet (0.6 mg total) by mouth 2 (two) times daily.  Patient not taking: Reported on 04/06/2023   Anders Simmonds, PA-C Not Taking Active   folic acid (FOLVITE) 1 MG tablet [id number] Yes Take 1 tablet (1 mg total) by mouth daily. [name], MD Taking Active Self, Pharmacy Records           Med Note [name], [facility]   Thu Dec 23, 2022 11:47 AM)    furosemide (LASIX) 40 MG tablet  [id number] Yes Take 1 tablet (40 mg total) by mouth daily. [name] M, PA-C Taking Active   glipiZIDE (GLUCOTROL XL) 5 MG 24 hr tablet [id number] Yes Take 1 tablet (5 mg total) by mouth daily with breakfast. [name], MD Taking Active Self, Pharmacy Records  LORazepam (ATIVAN) 0.5 MG tablet  [id number] No 1 tab tid today and tomorrow then 1 tab twice daily for 3 days then 1 tab daily until gone.  DO NOT DRINK ALCOHOL WHILE ON THIS MEDICATION.  THIS IS TO TAPER OFF ALCOHOL  Patient not taking: Reported on 04/06/2023   Anders Simmonds, PA-C Not Taking Active   losartan (COZAAR) 100 MG tablet [id number] Yes Take 1 tablet (100 mg total) by mouth daily. [name] M, PA-C Taking Active   meloxicam (MOBIC) 7.5 MG tablet [id number] Yes Take 1 tablet (7.5 mg total) by mouth daily. Persons, [place], [place] Taking Active   metoprolol succinate (TOPROL-XL) 50 MG 24 hr tablet [id number] Yes Take 1 tablet (50 mg total) by mouth daily. Take with or immediately following a meal. [name], MD Taking Active Self, Pharmacy Records           Med Note [name], [facility]   Thu Dec 23, 2022 11:47 AM)    nystatin cream (MYCOSTATIN) [id number] Yes Apply to affected areas 3 (three) times daily.  Patient taking differently: Apply 1 Application topically as needed (irritation).   [name], MD Taking Active Self, Pharmacy Records           Med Note [name], [name]   Wed Feb 16, 2023  5:57 AM) Last dose unknown; pt keeps on hand for rash breakouts  pantoprazole (PROTONIX) 40 MG tablet [id number] Yes Take 1 tablet (40 mg total) by mouth daily. [name] M, PA-C Taking Active   predniSONE (DELTASONE) 10 MG tablet [id number] Yes Take 1 tablet (10 mg total) by mouth daily with breakfast. Marcine Matar, MD Taking Active   pregabalin (LYRICA) 100 MG capsule [id number] Yes Take 1 capsule (100 mg total) by mouth 2 (two) times daily.  Patient taking differently: Take 100 mg by mouth daily.   Marcine Matar, MD Taking Active Self, Pharmacy Records            Patient Active Problem List   Diagnosis Date Noted   Pain in right knee 03/14/2023   COPD with acute exacerbation (HCC) 02/16/2023   COPD exacerbation (HCC) 11/22/2022   Dysphagia 10/01/2022   Alcohol dependence in remission (HCC)  03/02/2022   Aortic root dilation (HCC) 03/02/2022   Vision changes 12/29/2021   Oropharyngeal dysphagia 12/29/2021   Onychomycosis 12/29/2021   Malnutrition of moderate degree 12/05/2021   Tinea cruris 07/21/2021   Multiple lung nodules on CT 06/11/2021   COPD with chronic bronchitis (HCC) 02/01/2021   GERD (gastroesophageal reflux disease) 02/01/2021   Neuropathy 09/29/2020   Bronchiectasis (HCC) 09/24/2019   Type 2 diabetes mellitus (HCC) 07/24/2019   Other atopic dermatitis 05/01/2019   Acute on chronic respiratory failure (HCC) 04/23/2019   Intellectual disability 04/23/2019   HTN (hypertension) 05/17/2016   Former tobacco use 04/06/2016   COPD mixed type (HCC) 03/26/2016    Conditions to be addressed/monitored per PCP order:  COPD  Care Plan : RN Care Manager Plan of Care  Updates made by James Dach, RN since 04/06/2023 12:00 AM     Problem: Chronic Disease Management needs in patient with COPD   Priority: High  Long-Range Goal: Plan of care established for for Chronic Disease management for patient with COPD   Start Date: 11/13/2020  Expected End Date: 03/04/2023  Priority: High  Note:   Current Barriers:  Care Coordination needs related to Limited access to food  Chronic Disease Management support and education needs related to COPD    RNCM Clinical Goal(s):  Patient will verbalize understanding of plan for management of COPD as evidenced by taking all medications as prescribed, attending all scheduled appointments and contacting provider with questions and concerns take all medications exactly as prescribed and will call provider for medication related questions as evidenced by refilling medications and taking as prescribed    attend all scheduled medical appointments:  Dermatology on 04/06/23, TFC on 04/27/23 and PCP on 04/28/23 as evidenced by provider notes in EMR        demonstrate improved adherence to prescribed treatment plan for COPD as evidenced by  taking all medications as prescribed, attending all scheduled appointments and contacting provider with questions and concerns continue to work with RN Care Manager and/or Social Worker to address care management and care coordination needs related to COPD as evidenced by adherence to CM Team Scheduled appointments     through collaboration with Medical illustrator, provider, and care team.   Interventions: Inter-disciplinary care team collaboration (see longitudinal plan of care) Evaluation of current treatment plan related to  self management and patient's adherence to plan as established by provider  Ensured patient received provided list of provider contact information and address to each office Discussed the importance of patient learning to make and reschedule appointments and arrange transportation independently Provided encouragement Discussed warm transfer to new RNCM, scheduled telephone outreach on 04/22/23   COPD: (Status: Goal on Track (progressing): YES.) Long Term Goal  Reviewed medications with patient, updated EMR Advised patient to self assesses COPD action plan zone and make appointment with provider if in the yellow zone for 48 hours without improvement Provided education about and advised patient to utilize infection prevention strategies to reduce risk of respiratory infection Discussed the importance of adequate rest and management of fatigue with COPD Discussed triggers and avoiding being around fires and fumes  Discussed the importance of handwashing and wearing a mask in public, explained the importance of infection prevention Advised patient to work quitting drinking alcohol-down to a "couple of beers 1-2 nights a week" Reviewed transportation details to Dermatology on 04/06/23, pick up at 1pm and return ride home at 3:30pm, ref #[id number]-patient physically wrote down details Assisted with scheduling transportation to [facility] on 04/27/23, pick up at 1:15pm and return  ride home at 3:30pm, ref# [id number]; Dr. Laural Benes on 04/28/23, pick up at 9:30am and return ride home at 12:30pm Advised patient to call for prescriptions 3-5 days before running out Advised patient to discuss any concerns or questions with PCP   Patient Goals/Self-Care Activities: Take medications as prescribed   Attend all scheduled provider appointments Call pharmacy for medication refills 3-7 days in advance of running out of medications Perform all self care activities independently  Call provider office for new concerns or questions  - avoid second hand smoke - limit outdoor activity during cold weather - keep follow-up appointments: 01/26/23 with Dr. Ralene Cork - eat healthy/prescribed diet: DASH - get at least 7 to 8 hours of sleep at night       Follow Up:  Patient agrees to Care Plan and Follow-up.  Plan: The Managed Medicaid care management team will reach out  to the patient again over the next 14 days.  Date/time of next scheduled RN care management/care coordination outreach:  04/22/23 at 1pm  [name] RN, BSN [place]  Value-Based Care Institute [facility] Health RN Care Manager [phone]

## 2023-04-06 NOTE — Patient Instructions (Addendum)
 Hello James [name],  Thank you for visiting today. Here is a summary of the key instructions:  Medications: - Take terbinafine pill once a day for 30 days   - Take with dinner or a big breakfast with fatty food - Apply ketoconazole cream mixed with nystatin-trimethylamine ointment   - Use twice a day (morning and night) for 2 weeks   - Stop for at least a month before using again - If cream burns or itches, use Desitin  Hygiene: - Use Dove antibacterial soap or the Gentle Lever 2000 w/ Aloe in the shower - Shower every day or every other day to keep skin clean  Follow-up: - Return as needed - If condition doesn't improve after a month, come back for an emergency visit  Please reach out if you have any questions or concerns.  Warm regards,  Dr. Langston Reusing, Dermatology     Important Information   Due to recent changes in healthcare laws, you may see results of your pathology and/or laboratory studies on MyChart before the doctors have had a chance to review them. We understand that in some cases there may be results that are confusing or concerning to you. Please understand that not all results are received at the same time and often the doctors may need to interpret multiple results in order to provide you with the best plan of care or course of treatment. Therefore, we ask that you please give [place] 2 business days to thoroughly review all your results before contacting the office for clarification. Should we see a critical lab result, you will be contacted sooner.     If You Need Anything After Your Visit   If you have any questions or concerns for your doctor, please call our main line at [phone]. If no one answers, please leave a voicemail as directed and we will return your call as soon as possible. Messages left after 4 pm will be answered the following business day.    You may also send [place] a message via MyChart. We typically respond to MyChart messages within 1-2 business  days.  For prescription refills, please ask your pharmacy to contact our office. Our fax number is [phone].  If you have an urgent issue when the clinic is closed that cannot wait until the next business day, you can page your doctor at the number below.     Please note that while we do our best to be available for urgent issues outside of office hours, we are not available 24/7.    If you have an urgent issue and are unable to reach [place], you may choose to seek medical care at your doctor's office, retail clinic, urgent care center, or emergency room.   If you have a medical emergency, please immediately call 911 or go to the emergency department. In the event of inclement weather, please call our main line at [phone] for an update on the status of any delays or closures.  Dermatology Medication Tips: Please keep the boxes that topical medications come in in order to help keep track of the instructions about where and how to use these. Pharmacies typically print the medication instructions only on the boxes and not directly on the medication tubes.   If your medication is too expensive, please contact our office at [phone] or send [place] a message through MyChart.    We are unable to tell what your co-pay for medications will be in advance as this is different depending on  your insurance coverage. However, we may be able to find a substitute medication at lower cost or fill out paperwork to get insurance to cover a needed medication.    If a prior authorization is required to get your medication covered by your insurance company, please allow [place] 1-2 business days to complete this process.   Drug prices often vary depending on where the prescription is filled and some pharmacies may offer cheaper prices.   The website www.goodrx.com contains coupons for medications through different pharmacies. The prices here do not account for what the cost may be with help from insurance (it may  be cheaper with your insurance), but the website can give you the price if you did not use any insurance.  - You can print the associated coupon and take it with your prescription to the pharmacy.  - You may also stop by our office during regular business hours and pick up a GoodRx coupon card.  - If you need your prescription sent electronically to a different pharmacy, notify our office through [facility] or by phone at [phone]

## 2023-04-07 DIAGNOSIS — J9601 Acute respiratory failure with hypoxia: Secondary | ICD-10-CM | POA: Diagnosis not present

## 2023-04-07 DIAGNOSIS — J9621 Acute and chronic respiratory failure with hypoxia: Secondary | ICD-10-CM | POA: Diagnosis not present

## 2023-04-07 DIAGNOSIS — F79 Unspecified intellectual disabilities: Secondary | ICD-10-CM | POA: Diagnosis not present

## 2023-04-11 ENCOUNTER — Other Ambulatory Visit: Payer: Self-pay

## 2023-04-11 ENCOUNTER — Other Ambulatory Visit (HOSPITAL_COMMUNITY): Payer: Self-pay

## 2023-04-12 ENCOUNTER — Other Ambulatory Visit: Payer: Self-pay

## 2023-04-20 ENCOUNTER — Telehealth: Payer: Self-pay

## 2023-04-20 ENCOUNTER — Other Ambulatory Visit: Payer: Self-pay

## 2023-04-20 ENCOUNTER — Other Ambulatory Visit: Payer: Self-pay | Admitting: Physician Assistant

## 2023-04-20 ENCOUNTER — Other Ambulatory Visit: Payer: Self-pay | Admitting: Critical Care Medicine

## 2023-04-20 ENCOUNTER — Other Ambulatory Visit (HOSPITAL_COMMUNITY): Payer: Self-pay

## 2023-04-20 DIAGNOSIS — B356 Tinea cruris: Secondary | ICD-10-CM

## 2023-04-20 MED ORDER — TRIAMCINOLONE ACETONIDE 0.025 % EX OINT
1.0000 | TOPICAL_OINTMENT | Freq: Two times a day (BID) | CUTANEOUS | 2 refills | Status: DC
  Filled 2023-04-20 (×2): qty 30, 15d supply, fill #0
  Filled 2023-07-19: qty 30, 15d supply, fill #1
  Filled 2023-10-03: qty 30, 15d supply, fill #2

## 2023-04-20 MED ORDER — NYSTATIN 100000 UNIT/GM EX OINT
1.0000 | TOPICAL_OINTMENT | Freq: Two times a day (BID) | CUTANEOUS | 2 refills | Status: DC
  Filled 2023-04-20 (×2): qty 30, 15d supply, fill #0
  Filled 2023-07-12: qty 30, 15d supply, fill #1
  Filled 2023-08-08: qty 30, 15d supply, fill #2

## 2023-04-20 MED ORDER — MELOXICAM 7.5 MG PO TABS
7.5000 mg | ORAL_TABLET | Freq: Every day | ORAL | 0 refills | Status: DC
  Filled 2023-04-20: qty 30, 30d supply, fill #0

## 2023-04-20 MED ORDER — TERBINAFINE HCL 250 MG PO TABS
250.0000 mg | ORAL_TABLET | Freq: Every day | ORAL | 0 refills | Status: DC
Start: 2023-04-20 — End: 2023-10-18
  Filled 2023-04-20 (×2): qty 30, 30d supply, fill #0

## 2023-04-20 NOTE — Telephone Encounter (Signed)
 Per VM from [facility] pharacist, Autry Legions, insurance will not pay for [facility] together. It must be separate Rx's. Terbinafine Rx was not received on day of OV.   Ok to sent in separate scripts per Dr. Myrtie Atkinson.

## 2023-04-22 ENCOUNTER — Other Ambulatory Visit: Payer: Self-pay

## 2023-04-22 ENCOUNTER — Other Ambulatory Visit (HOSPITAL_COMMUNITY): Payer: Self-pay

## 2023-04-27 ENCOUNTER — Encounter: Payer: Self-pay | Admitting: Podiatry

## 2023-04-27 ENCOUNTER — Ambulatory Visit: Payer: Medicaid Other | Admitting: Podiatry

## 2023-04-27 DIAGNOSIS — M79674 Pain in right toe(s): Secondary | ICD-10-CM | POA: Diagnosis not present

## 2023-04-27 DIAGNOSIS — M79675 Pain in left toe(s): Secondary | ICD-10-CM

## 2023-04-27 DIAGNOSIS — B351 Tinea unguium: Secondary | ICD-10-CM | POA: Diagnosis not present

## 2023-04-27 DIAGNOSIS — E119 Type 2 diabetes mellitus without complications: Secondary | ICD-10-CM

## 2023-04-27 NOTE — Progress Notes (Signed)
  Subjective:  Patient ID: James [name], male    DOB: May 01, 1960,   MRN: [id number]  Chief Complaint  Patient presents with   Nail Problem    RFC    63 y.o. male presents for concern of thickened elongated and painful nails that are difficult to trim. Requesting to have them trimmed today. Relates burning and tingling in their feet. Patient is diabetic and last A1c was  Lab Results  Component Value Date   HGBA1C 5.9 (H) 02/16/2023   .   PCP:  Lawrance Presume, MD    . Denies any other pedal complaints. Denies n/v/f/c.   Past Medical History:  Diagnosis Date   Acute on chronic respiratory failure with hypoxia (HCC) 06/08/2013   Acute on chronic respiratory failure with hypoxia and hypercapnia (HCC) 06/08/2013   AKI (acute kidney injury) (HCC) 07/12/2022   Alcohol use disorder 11/23/2018   CAP (community acquired pneumonia) 05/17/2018   See admit 05/17/18 ? rml  with   covid pcr neg - rx augmentin  > f/u cxr in 4-6 weeks is fine unless condition declines    Community acquired pneumonia 07/12/2021   Community acquired pneumonia of right lower lobe of lung 05/17/2018   See admit 05/17/18 ? 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 CFT <3 seconds. Absent hair growth on digits. Edema noted to bilateral lower extremities. Xerosis noted bilaterally.  Skin. No lacerations or abrasions bilateral  feet. Nails 1-5 bilateral  are thickened discolored and elongated with subungual debris. Small abrasion to plantar left foot. No erythema edema or purulence noted.  Musculoskeletal: MMT 5/5 bilateral lower extremities in DF, PF, Inversion and Eversion. Deceased ROM in DF of ankle joint.  Neurological: Sensation intact to light touch. Protective sensation diminished bilateral.    Assessment:   1. Pain due to onychomycosis of toenails of both feet   2. Type 2 diabetes mellitus without complication, without long-term current use of insulin  (HCC)        Plan:  Patient was evaluated and treated and all questions answered. -Discussed and educated patient on diabetic foot care, especially with  regards to the vascular, neurological and musculoskeletal systems.  -Stressed the importance of good glycemic control and the detriment of not  controlling glucose levels in relation to the foot. -Discussed supportive shoes at all times and checking feet regularly.  -Mechanically debrided all nails 1-5 bilateral using sterile nail nipper and filed with dremel without incident  -Answered all patient questions -Patient to return  in 3 months for at risk foot care -Patient advised to call the office if any problems or questions arise in the meantime.   Jennefer Moats, DPM

## 2023-04-28 ENCOUNTER — Other Ambulatory Visit (HOSPITAL_COMMUNITY): Payer: Self-pay

## 2023-04-28 ENCOUNTER — Ambulatory Visit: Payer: Medicaid Other | Attending: Internal Medicine | Admitting: Internal Medicine

## 2023-04-28 ENCOUNTER — Other Ambulatory Visit: Payer: Self-pay

## 2023-04-28 ENCOUNTER — Encounter: Payer: Self-pay | Admitting: Internal Medicine

## 2023-04-28 VITALS — BP 132/76 | HR 74 | Temp 97.6°F | Ht 69.0 in | Wt 202.0 lb

## 2023-04-28 DIAGNOSIS — Z9981 Dependence on supplemental oxygen: Secondary | ICD-10-CM

## 2023-04-28 DIAGNOSIS — F1090 Alcohol use, unspecified, uncomplicated: Secondary | ICD-10-CM | POA: Diagnosis not present

## 2023-04-28 DIAGNOSIS — J449 Chronic obstructive pulmonary disease, unspecified: Secondary | ICD-10-CM | POA: Diagnosis not present

## 2023-04-28 DIAGNOSIS — R918 Other nonspecific abnormal finding of lung field: Secondary | ICD-10-CM

## 2023-04-28 DIAGNOSIS — S80819A Abrasion, unspecified lower leg, initial encounter: Secondary | ICD-10-CM

## 2023-04-28 DIAGNOSIS — Z92241 Personal history of systemic steroid therapy: Secondary | ICD-10-CM | POA: Diagnosis not present

## 2023-04-28 DIAGNOSIS — Z1211 Encounter for screening for malignant neoplasm of colon: Secondary | ICD-10-CM | POA: Diagnosis not present

## 2023-04-28 DIAGNOSIS — Z7984 Long term (current) use of oral hypoglycemic drugs: Secondary | ICD-10-CM

## 2023-04-28 DIAGNOSIS — I5032 Chronic diastolic (congestive) heart failure: Secondary | ICD-10-CM

## 2023-04-28 DIAGNOSIS — J9611 Chronic respiratory failure with hypoxia: Secondary | ICD-10-CM | POA: Diagnosis not present

## 2023-04-28 DIAGNOSIS — I152 Hypertension secondary to endocrine disorders: Secondary | ICD-10-CM

## 2023-04-28 DIAGNOSIS — E1159 Type 2 diabetes mellitus with other circulatory complications: Secondary | ICD-10-CM | POA: Diagnosis not present

## 2023-04-28 DIAGNOSIS — E119 Type 2 diabetes mellitus without complications: Secondary | ICD-10-CM

## 2023-04-28 MED ORDER — ATORVASTATIN CALCIUM 10 MG PO TABS
10.0000 mg | ORAL_TABLET | Freq: Every day | ORAL | 3 refills | Status: AC
  Filled 2023-04-28 – 2023-07-19 (×2): qty 90, 90d supply, fill #0
  Filled 2023-10-18: qty 90, 90d supply, fill #1
  Filled 2024-01-06: qty 90, 90d supply, fill #2

## 2023-04-28 MED ORDER — GLIPIZIDE ER 2.5 MG PO TB24
2.5000 mg | ORAL_TABLET | Freq: Every day | ORAL | 1 refills | Status: DC
Start: 2023-04-28 — End: 2023-10-18
  Filled 2023-04-28 (×2): qty 90, 90d supply, fill #0
  Filled 2023-07-19: qty 90, 90d supply, fill #1

## 2023-04-28 NOTE — Progress Notes (Signed)
 Patient ID: James [name], male    DOB: [birth year]  MRN: [id number]  CC: Hypertension (HTN f/u. Vallery Gavel on R leg X3 weeks ago /)   Subjective: James [name] is a 63 y.o. male who presents for chronic ds management. His concerns today include:  Patient with history of DM type II, HTN, COPD mixed type, bronchiectasis, lung nodules, EtOH use disorder in remission, aortic root dilatation, diastolic CHF, iron  def  Discussed the use of AI scribe software for clinical note transcription with the patient, who gave verbal consent to proceed.  History of Present Illness COPD:  last saw me in December and was hospitalized in February due to a COPD exacerbation. He uses a Breztri  inhaler twice daily and is on 2 liters of oxygen , mainly at night, but also during the day more so in the summer due to heat intolerance. He also has an albuterol  inhaler, which he uses infrequently. He reports his breathing is at baseline. He quit smoking 14 years ago but continues to use snuff. -He has been on prednisone  10mg  for about two years for his COPD from previous PCP  - Had noncontrast CT of the chest 2 months ago.  2 mm RLL nodule stable in size consistent with benign etiology.  No follow-up imaging recommended.  Anemia:  He also has a history of iron  deficiency anemia; given iron  infusion during hosp 11/2022 and was referred to a gastroenterologist, but the appointment status is unclear.  Looks like they sent him a MyChart message and also mailed him a letter requesting that he call to schedule an appointment.  HTN/CHFpEF:  managed with toprol  50mg  daily and lorsatin 100mg  daily.  Took medicines already for today.  Limit salt in the foods.   He has a skin abrasion on his right shin from three weeks ago, where he accidentally "skinned" his leg against something.  Does not recall what but wanted to make sure it is healing.    DM: He has diabetes, managed with glipizide  5mg  daily. His last A1c in February was 5.9.  He reports almost daily episodes of low blood sugar, which improves with eating.  He also has hyperlipidemia, managed with atorvastatin .  He has a history of congestive heart failure, but denies shortness of breath when laying down at night and is taking furosemide .  ETOH UD: Lastly, he consumes 2-3 small beers daily; reports he has cut back a lot.  Encouraged to get into AA 12-step program and seen by our PA a few months ago.  He was given some Ativan  at that time for withdrawal symptoms.    Patient Active Problem List   Diagnosis Date Noted   Pain in right knee 03/14/2023   COPD with acute exacerbation (HCC) 02/16/2023   COPD exacerbation (HCC) 11/22/2022   Dysphagia 10/01/2022   Alcohol dependence in remission (HCC) 03/02/2022   Aortic root dilation (HCC) 03/02/2022   Vision changes 12/29/2021   Oropharyngeal dysphagia 12/29/2021   Onychomycosis 12/29/2021   Malnutrition of moderate degree 12/05/2021   Tinea cruris 07/21/2021   Multiple lung nodules on CT 06/11/2021   COPD with chronic bronchitis (HCC) 02/01/2021   GERD (gastroesophageal reflux disease) 02/01/2021   Neuropathy 09/29/2020   Bronchiectasis (HCC) 09/24/2019   Type 2 diabetes mellitus (HCC) 07/24/2019   Other atopic dermatitis 05/01/2019   Acute on chronic respiratory failure (HCC) 04/23/2019   Intellectual disability 04/23/2019   HTN (hypertension) 05/17/2016   Former tobacco use 04/06/2016   COPD mixed type (HCC)  03/26/2016     Current Outpatient Medications on File Prior to Visit  Medication Sig Dispense Refill   albuterol  (PROVENTIL ) (2.5 MG/3ML) 0.083% nebulizer solution Inhale 3 mLs (2.5 mg total) by nebulization every 4 (four) hours as needed for wheezing or shortness of breath. 270 mL 1   albuterol  (VENTOLIN  HFA) 108 (90 Base) MCG/ACT inhaler Inhale 2 puffs into the lungs every 6 (six) hours as needed for wheezing or shortness of breath. 54 g 1   budeson-glycopyrrolate -formoterol  (BREZTRI  AEROSPHERE)  160-9-4.8 MCG/ACT AERO inhaler Inhale 2 puffs into the lungs in the morning and at bedtime. 10.7 g 11   folic acid  (FOLVITE ) 1 MG tablet Take 1 tablet (1 mg total) by mouth daily. 90 tablet 1   furosemide  (LASIX ) 40 MG tablet Take 1 tablet (40 mg total) by mouth daily. 90 tablet 1   LORazepam  (ATIVAN ) 0.5 MG tablet 1 tab tid today and tomorrow then 1 tab twice daily for 3 days then 1 tab daily until gone.  DO NOT DRINK ALCOHOL WHILE ON THIS MEDICATION.  THIS IS TO TAPER OFF ALCOHOL 30 tablet 0   losartan  (COZAAR ) 100 MG tablet Take 1 tablet (100 mg total) by mouth daily. 90 tablet 2   meloxicam  (MOBIC ) 7.5 MG tablet Take 1 tablet (7.5 mg total) by mouth daily. 30 tablet 0   metoprolol  succinate (TOPROL -XL) 50 MG 24 hr tablet Take 1 tablet (50 mg total) by mouth daily. Take with or immediately following a meal. 90 tablet 2   nystatin  cream (MYCOSTATIN ) Apply to affected areas 3 (three) times daily. (Patient taking differently: Apply 1 Application topically as needed (irritation).) 30 g 2   nystatin  ointment (MYCOSTATIN ) Apply 1 Application topically 2 (two) times daily. Mix with triamcinolone . Use for 2 weeks. 30 g 2   pantoprazole  (PROTONIX ) 40 MG tablet Take 1 tablet (40 mg total) by mouth daily. 90 tablet 1   predniSONE  (DELTASONE ) 10 MG tablet Take 1 tablet (10 mg total) by mouth daily with breakfast. 30 tablet 4   pregabalin  (LYRICA ) 100 MG capsule Take 1 capsule (100 mg total) by mouth 2 (two) times daily. (Patient taking differently: Take 100 mg by mouth daily.) 60 capsule 3   terbinafine  (LAMISIL ) 250 MG tablet Take 1 tablet (250 mg total) by mouth daily. 30 tablet 0   triamcinolone  (KENALOG ) 0.025 % ointment Apply 1 Application topically 2 (two) times daily. Mix with nystatin . Use for 2 weeks. 30 g 2   Current Facility-Administered Medications on File Prior to Visit  Medication Dose Route Frequency Provider Last Rate Last Admin   terbinafine  (LAMISIL ) tablet 250 mg  250 mg Oral Daily Dellar Fenton, DO        No Known Allergies  Social History   Socioeconomic History   Marital status: Divorced    Spouse name: Not on file   Number of children: Not on file   Years of education: Not on file   Highest education level: Not on file  Occupational History   Occupation: Curator   Occupation: Painter  Tobacco Use   Smoking status: Former    Current packs/day: 0.00    Average packs/day: 1.5 packs/day for 40.0 years (60.0 ttl pk-yrs)    Types: Cigarettes    Start date: 59    Quit date: 2014    Years since quitting: 11.3   Smokeless tobacco: Former    Types: Engineer, drilling   Vaping status: Never Used  Substance and Sexual Activity  Alcohol use: Yes    Alcohol/week: 4.0 standard drinks of alcohol    Types: 4 Cans of beer per week    Comment: 4 beers a night previously   Drug use: Not Currently    Types: Marijuana    Comment: daily; quit using crack and methamphetamine about 5 months ago    Sexual activity: Not Currently    Partners: Female  Other Topics Concern   Not on file  Social History Narrative   Lives alone near [place]   Social Drivers of Health   Financial Resource Strain: Low Risk  (12/25/2020)   Overall Financial Resource Strain (CARDIA)    Difficulty of Paying Living Expenses: Not very hard  Recent Concern: Financial Resource Strain - High Risk (10/29/2020)   Overall Financial Resource Strain (CARDIA)    Difficulty of Paying Living Expenses: Hard  Food Insecurity: No Food Insecurity (02/24/2023)   Hunger Vital Sign    Worried About Running Out of Food in the Last Year: Never true    Ran Out of Food in the Last Year: Never true  Transportation Needs: Unmet Transportation Needs (02/17/2023)   PRAPARE - Transportation    Lack of Transportation (Medical): Yes    Lack of Transportation (Non-Medical): Yes  Physical Activity: Inactive (12/11/2020)   Exercise Vital Sign    Days of Exercise per Week: 0 days    Minutes of Exercise per Session:  0 min  Stress: No Stress Concern Present (12/25/2020)   [name] of Occupational Health - Occupational Stress Questionnaire    Feeling of Stress : Not at all  Social Connections: Moderately Integrated (11/13/2020)   Social Connection and Isolation Panel [NHANES]    Frequency of Communication with Friends and Family: More than three times a week    Frequency of Social Gatherings with Friends and Family: Once a week    Attends Religious Services: 1 to 4 times per year    Active Member of [employer] or Organizations: Yes    Attends Banker Meetings: 1 to 4 times per year    Marital Status: Divorced  Intimate Partner Violence: Not At Risk (02/17/2023)   Humiliation, Afraid, Rape, and Kick questionnaire    Fear of Current or Ex-Partner: No    Emotionally Abused: No    Physically Abused: No    Sexually Abused: No    Family History  Problem Relation Age of Onset   Heart disease Father    COPD Sister     Past Surgical History:  Procedure Laterality Date   ESOPHAGEAL MANOMETRY N/A 09/15/2022   Procedure: ESOPHAGEAL MANOMETRY (EM);  Surgeon: Tobin Forts, MD;  Location: [facility];  Service: Gastroenterology;  Laterality: N/A;   LEFT HEART CATH AND CORONARY ANGIOGRAPHY N/A 12/07/2021   Procedure: LEFT HEART CATH AND CORONARY ANGIOGRAPHY;  Surgeon: Avanell Leigh, MD;  Location: [facility]VASIVE [facility] LAB;  Service: Cardiovascular;  Laterality: N/A;   LUNG REMOVAL, PARTIAL     VIDEO ASSISTED THORACOSCOPY (VATS)/THOROCOTOMY Left 06/11/2013   Procedure: VIDEO ASSISTED THORACOSCOPY (VATS)/THOROCOTOMY;  Surgeon: Heriberto London, MD;  Location: [facility] OR;  Service: Thoracic;  Laterality: Left;   VIDEO BRONCHOSCOPY N/A 06/11/2013   Procedure: VIDEO BRONCHOSCOPY;  Surgeon: Heriberto London, MD;  Location: [facility] OR;  Service: Thoracic;  Laterality: N/A;   VIDEO BRONCHOSCOPY N/A 08/10/2018   Procedure: VIDEO BRONCHOSCOPY WITH FLUORO;  Surgeon: Josiah Nigh, MD;  Location: [facility] ENDOSCOPY;   Service: Endoscopy;  Laterality: N/A;    ROS: Review of  Systems Negative except as stated above  PHYSICAL EXAM: BP 132/76   Pulse 74   Temp 97.6 F (36.4 C) (Oral)   Ht 5\' 9"  (1.753 m)   Wt 202 lb (91.6 kg)   SpO2 96%   BMI 29.83 kg/m   Patient has his portable O2 with him but had it off at the time that I entered the room. Physical Exam General appearance - older caucasian male in NAD Mental status - alert. Answers questions appropriately Neck - supple, no significant adenopathy Lymphatics - no palpable lymphadenopathy, no hepatosplenomegaly Chest -breath sounds are moderate to severely decreased.  Some expiratory wheezes heard.   Heart - normal rate, regular rhythm, normal S1, S2, no murmurs, rubs, clicks or gallops Extremities - peripheral pulses normal, no pedal edema, no clubbing or cyanosis Skin - 1.5 cm abrasion noted on the right mid shin.  It has hyperpigmented scab on it.  No surrounding erythema or fluctuance.  Not tender to touch     Latest Ref Rng & Units 02/18/2023    6:05 AM 02/16/2023    1:35 AM 02/16/2023   12:12 AM  CMP  Glucose 70 - 99 mg/dL 161   74   BUN 8 - 23 mg/dL 24   17   Creatinine 0.96 - 1.24 mg/dL 0.45   4.09   Sodium 811 - 145 mmol/L 139  139  139   Potassium 3.5 - 5.1 mmol/L 4.1  3.5  3.7   Chloride 98 - 111 mmol/L 105   101   CO2 22 - 32 mmol/L 26   25   Calcium  8.9 - 10.3 mg/dL 9.0   8.9    Lipid Panel     Component Value Date/Time   CHOL 158 03/02/2022 0912   TRIG 140 03/02/2022 0912   HDL 52 03/02/2022 0912   CHOLHDL 3.0 03/02/2022 0912   LDLCALC 82 03/02/2022 0912    CBC    Component Value Date/Time   WBC 11.3 (H) 02/18/2023 0605   RBC 4.56 02/18/2023 0605   HGB 14.3 02/18/2023 0605   HGB 13.1 12/28/2022 1011   HCT 43.4 02/18/2023 0605   HCT 41.5 12/28/2022 1011   PLT 210 02/18/2023 0605   PLT 271 12/28/2022 1011   MCV 95.2 02/18/2023 0605   MCV 92 12/28/2022 1011   MCH 31.4 02/18/2023 0605   MCHC 32.9 02/18/2023 0605    RDW 16.0 (H) 02/18/2023 0605   RDW 17.4 (H) 12/28/2022 1011   LYMPHSABS 1.7 11/22/2022 0841   LYMPHSABS 1.2 06/29/2022 1204   MONOABS 1.5 (H) 11/22/2022 0841   EOSABS 0.2 11/22/2022 0841   EOSABS 0.2 06/29/2022 1204   BASOSABS 0.1 11/22/2022 0841   BASOSABS 0.0 06/29/2022 1204    ASSESSMENT AND PLAN: 1. Diabetes mellitus treated with oral medication (HCC) (Primary) At goal.  He reports frequent hypoglycemic episodes.  I recommend decreasing the glipizide  to 2.5 mg once a day. - atorvastatin  (LIPITOR) 10 MG tablet; Take 1 tablet (10 mg total) by mouth daily.  Dispense: 90 tablet; Refill: 3 - glipiZIDE  (GLUCOTROL  XL) 2.5 MG 24 hr tablet; Take 1 tablet (2.5 mg total) by mouth daily with breakfast.  Dispense: 90 tablet; Refill: 1  2. Hypertension associated with type 2 diabetes mellitus (HCC) To goal.  Continue metoprolol  XL 50 mg daily and Cozaar  100 mg daily  3. Steroid-dependent chronic obstructive pulmonary disease (HCC) Continue Breztri  inhaler.  Discussed trying to wean down the prednisone  to 7.5 mg once a day but  patient too nervous about doing it stating that when they tried to wean he ends up in the hospital with a flare. - Ambulatory referral to Pulmonology  4. Chronic respiratory failure with hypoxia, on home oxygen  therapy (HCC) Continue home O2  5. Lung nodules Stable in size from previous CAT scan.  He quit smoking 14 years ago.  Encouraged him to quit snuff  6. Chronic diastolic congestive heart failure (HCC) Able and compensated.  Not on diuretic  7. Alcohol use disorder Commended him on cutting back.  Encouraged him to cut back further to no more than two 12 oz beers a day  8. Abrasion, leg w/o infection Appears to be healing without signs of infection.  Advised to allow the scab to fall off by itself eventually.  9. Screening for colon cancer Does not want to have colonoscopy.  Agreeable to doing Cologuard instead. - Cologuard   Patient was given the  opportunity to ask questions.  Patient verbalized understanding of the plan and was able to repeat key elements of the plan.   This documentation was completed using Paediatric nurse.  Any transcriptional errors are unintentional.  Orders Placed This Encounter  Procedures   Cologuard   Ambulatory referral to Pulmonology     Requested Prescriptions   Signed Prescriptions Disp Refills   atorvastatin  (LIPITOR) 10 MG tablet 90 tablet 3    Sig: Take 1 tablet (10 mg total) by mouth daily.   glipiZIDE  (GLUCOTROL  XL) 2.5 MG 24 hr tablet 90 tablet 1    Sig: Take 1 tablet (2.5 mg total) by mouth daily with breakfast.    Return in about 4 months (around 08/28/2023).  Concetta Dee, MD, FACP

## 2023-05-04 ENCOUNTER — Other Ambulatory Visit (HOSPITAL_COMMUNITY): Payer: Self-pay

## 2023-05-04 ENCOUNTER — Encounter (HOSPITAL_BASED_OUTPATIENT_CLINIC_OR_DEPARTMENT_OTHER): Payer: Self-pay

## 2023-05-04 ENCOUNTER — Other Ambulatory Visit: Payer: Self-pay

## 2023-05-05 DIAGNOSIS — F79 Unspecified intellectual disabilities: Secondary | ICD-10-CM | POA: Diagnosis not present

## 2023-05-05 DIAGNOSIS — J9621 Acute and chronic respiratory failure with hypoxia: Secondary | ICD-10-CM | POA: Diagnosis not present

## 2023-05-05 DIAGNOSIS — J9601 Acute respiratory failure with hypoxia: Secondary | ICD-10-CM | POA: Diagnosis not present

## 2023-05-07 DIAGNOSIS — J9601 Acute respiratory failure with hypoxia: Secondary | ICD-10-CM | POA: Diagnosis not present

## 2023-05-07 DIAGNOSIS — F79 Unspecified intellectual disabilities: Secondary | ICD-10-CM | POA: Diagnosis not present

## 2023-05-07 DIAGNOSIS — J9621 Acute and chronic respiratory failure with hypoxia: Secondary | ICD-10-CM | POA: Diagnosis not present

## 2023-05-08 ENCOUNTER — Encounter (HOSPITAL_COMMUNITY): Payer: Self-pay | Admitting: Emergency Medicine

## 2023-05-08 ENCOUNTER — Emergency Department (HOSPITAL_COMMUNITY)
Admission: EM | Admit: 2023-05-08 | Discharge: 2023-05-09 | Disposition: A | Discharge status: Home / Self Care | Attending: Emergency Medicine | Admitting: Emergency Medicine

## 2023-05-08 ENCOUNTER — Emergency Department (HOSPITAL_COMMUNITY)

## 2023-05-08 DIAGNOSIS — J441 Chronic obstructive pulmonary disease with (acute) exacerbation: Secondary | ICD-10-CM | POA: Insufficient documentation

## 2023-05-08 DIAGNOSIS — I959 Hypotension, unspecified: Secondary | ICD-10-CM | POA: Diagnosis not present

## 2023-05-08 DIAGNOSIS — Z87891 Personal history of nicotine dependence: Secondary | ICD-10-CM | POA: Insufficient documentation

## 2023-05-08 DIAGNOSIS — R059 Cough, unspecified: Secondary | ICD-10-CM | POA: Insufficient documentation

## 2023-05-08 DIAGNOSIS — J189 Pneumonia, unspecified organism: Secondary | ICD-10-CM | POA: Diagnosis not present

## 2023-05-08 DIAGNOSIS — R062 Wheezing: Secondary | ICD-10-CM | POA: Diagnosis not present

## 2023-05-08 DIAGNOSIS — R0602 Shortness of breath: Secondary | ICD-10-CM | POA: Diagnosis not present

## 2023-05-08 DIAGNOSIS — I700 Atherosclerosis of aorta: Secondary | ICD-10-CM | POA: Diagnosis not present

## 2023-05-08 DIAGNOSIS — R0902 Hypoxemia: Secondary | ICD-10-CM | POA: Diagnosis not present

## 2023-05-08 LAB — CBC WITH DIFFERENTIAL/PLATELET
Abs Immature Granulocytes: 0.03 10*3/uL (ref 0.00–0.07)
Basophils Absolute: 0.1 10*3/uL (ref 0.0–0.1)
Basophils Relative: 1 %
Eosinophils Absolute: 0.2 10*3/uL (ref 0.0–0.5)
Eosinophils Relative: 2 %
HCT: 39.6 % (ref 39.0–52.0)
Hemoglobin: 12.6 g/dL — ABNORMAL LOW (ref 13.0–17.0)
Immature Granulocytes: 0 %
Lymphocytes Relative: 37 %
Lymphs Abs: 3.4 10*3/uL (ref 0.7–4.0)
MCH: 31.8 pg (ref 26.0–34.0)
MCHC: 31.8 g/dL (ref 30.0–36.0)
MCV: 100 fL (ref 80.0–100.0)
Monocytes Absolute: 0.7 10*3/uL (ref 0.1–1.0)
Monocytes Relative: 8 %
Neutro Abs: 4.7 10*3/uL (ref 1.7–7.7)
Neutrophils Relative %: 52 %
Platelets: 233 10*3/uL (ref 150–400)
RBC: 3.96 MIL/uL — ABNORMAL LOW (ref 4.22–5.81)
RDW: 13.3 % (ref 11.5–15.5)
WBC: 9 10*3/uL (ref 4.0–10.5)
nRBC: 0 % (ref 0.0–0.2)

## 2023-05-08 LAB — I-STAT VENOUS BLOOD GAS, ED
Acid-Base Excess: 3 mmol/L — ABNORMAL HIGH (ref 0.0–2.0)
Bicarbonate: 29.1 mmol/L — ABNORMAL HIGH (ref 20.0–28.0)
Calcium, Ion: 1.09 mmol/L — ABNORMAL LOW (ref 1.15–1.40)
HCT: 38 % — ABNORMAL LOW (ref 39.0–52.0)
Hemoglobin: 12.9 g/dL — ABNORMAL LOW (ref 13.0–17.0)
O2 Saturation: 98 %
Potassium: 5 mmol/L (ref 3.5–5.1)
Sodium: 139 mmol/L (ref 135–145)
TCO2: 31 mmol/L (ref 22–32)
pCO2, Ven: 49.7 mmHg (ref 44–60)
pH, Ven: 7.375 (ref 7.25–7.43)
pO2, Ven: 110 mmHg — ABNORMAL HIGH (ref 32–45)

## 2023-05-08 LAB — RESP PANEL BY RT-PCR (RSV, FLU A&B, COVID)  RVPGX2
Influenza A by PCR: NEGATIVE
Influenza B by PCR: NEGATIVE
Resp Syncytial Virus by PCR: NEGATIVE
SARS Coronavirus 2 by RT PCR: NEGATIVE

## 2023-05-08 LAB — BASIC METABOLIC PANEL WITH GFR
Anion gap: 10 (ref 5–15)
BUN: 19 mg/dL (ref 8–23)
CO2: 27 mmol/L (ref 22–32)
Calcium: 8.7 mg/dL — ABNORMAL LOW (ref 8.9–10.3)
Chloride: 104 mmol/L (ref 98–111)
Creatinine, Ser: 1.12 mg/dL (ref 0.61–1.24)
GFR, Estimated: >60 mL/min (ref >60)
Glucose, Bld: 109 mg/dL — ABNORMAL HIGH (ref 70–99)
Potassium: 4.8 mmol/L (ref 3.5–5.1)
Sodium: 141 mmol/L (ref 135–145)

## 2023-05-08 MED ORDER — SODIUM CHLORIDE 0.9 % IV BOLUS
1000.0000 mL | Freq: Once | INTRAVENOUS | Status: AC
  Administered 2023-05-08: 1000 mL via INTRAVENOUS

## 2023-05-08 MED ORDER — PREDNISONE 20 MG PO TABS: 40.0000 mg | ORAL_TABLET | Freq: Every day | ORAL | 0 refills | Status: DC

## 2023-05-08 MED ORDER — ALBUTEROL SULFATE (2.5 MG/3ML) 0.083% IN NEBU
10.0000 mg/h | INHALATION_SOLUTION | Freq: Once | RESPIRATORY_TRACT | Status: AC
  Administered 2023-05-08: 10 mg/h via RESPIRATORY_TRACT
  Filled 2023-05-08: qty 12

## 2023-05-08 NOTE — ED Notes (Addendum)
 87% on RA; placed back on 2L [place]

## 2023-05-08 NOTE — ED Notes (Signed)
 Patient uses 2L [place] at baseline.

## 2023-05-08 NOTE — Discharge Instructions (Addendum)
 Please continue your breathing treatments.  Prednisone  for 4 more days.  Contact your primary care doctor for close follow-up.  Return to the emergency department if any worsening or concerning symptoms

## 2023-05-08 NOTE — ED Triage Notes (Signed)
 Patient BIB EMS from home. Patient was doing some work around the house when he suddenly because ShOB, has been feeling under the weather for a few days. Patient has a history of COPD. Patient was found in the 80's on room air & SBP 90's by EMS.  Alb/Atro (DuoNeb) x2 Mag Solu-Med 500 NS

## 2023-05-08 NOTE — ED Notes (Signed)
 ED Provider at bedside.

## 2023-05-08 NOTE — ED Provider Notes (Signed)
 [place] EMERGENCY DEPARTMENT AT [facility] Provider Note   CSN: [id number] Arrival date & time: 05/08/23  2009     History {Add pertinent medical, surgical, social history, OB history to HPI:1} Chief Complaint  Patient presents with   Respiratory Distress    James [name] is a 63 y.o. male.  He has a history of COPD and is on 2 L of oxygen .  He said he has been more short of breath over the last few days and was much more severe tonight.  He thinks it has something to do with the weather.  Nonproductive cough.  No chest pain or abdominal pain no fevers or chills.  He said he was admitted a few months ago for COPD.  Quit smoking many years ago.  No hemoptysis.  No leg swelling.  EMS found him saturating 80% on room air and gave him magnesium , Solu-Medrol , DuoNebs and some saline.  Patient states he feels a little bit better.  The history is provided by the patient and the EMS personnel.  Shortness of Breath Severity:  Severe Onset quality:  Gradual Timing:  Constant Chronicity:  Recurrent Context: weather changes   Relieved by:  Nothing Worsened by:  Activity Ineffective treatments:  Oxygen  Associated symptoms: cough   Associated symptoms: no abdominal pain, no chest pain, no fever, no hemoptysis and no sputum production        Home Medications Prior to Admission medications   Medication Sig Start Date End Date Taking? Authorizing Provider  albuterol  (PROVENTIL ) (2.5 MG/3ML) 0.083% nebulizer solution Inhale 3 mLs (2.5 mg total) by nebulization every 4 (four) hours as needed for wheezing or shortness of breath. 03/03/23   McClung, Angela M, PA-C  albuterol  (VENTOLIN  HFA) 108 (90 Base) MCG/ACT inhaler Inhale 2 puffs into the lungs every 6 (six) hours as needed for wheezing or shortness of breath. 03/03/23   Hassie Lint, PA-C  atorvastatin  (LIPITOR) 10 MG tablet Take 1 tablet (10 mg total) by mouth daily. 04/28/23   Lawrance Presume, MD   budeson-glycopyrrolate -formoterol  (BREZTRI  AEROSPHERE) 160-9-4.8 MCG/ACT AERO inhaler Inhale 2 puffs into the lungs in the morning and at bedtime. 09/28/22   Vernell Goldsmith, MD  folic acid  (FOLVITE ) 1 MG tablet Take 1 tablet (1 mg total) by mouth daily. 09/28/22   Vernell Goldsmith, MD  furosemide  (LASIX ) 40 MG tablet Take 1 tablet (40 mg total) by mouth daily. 03/03/23   Hassie Lint, PA-C  glipiZIDE  (GLUCOTROL  XL) 2.5 MG 24 hr tablet Take 1 tablet (2.5 mg total) by mouth daily with breakfast. 04/28/23   Lawrance Presume, MD  LORazepam  (ATIVAN ) 0.5 MG tablet 1 tab tid today and tomorrow then 1 tab twice daily for 3 days then 1 tab daily until gone.  DO NOT DRINK ALCOHOL WHILE ON THIS MEDICATION.  THIS IS TO TAPER OFF ALCOHOL 03/03/23   Hassie Lint, PA-C  losartan  (COZAAR ) 100 MG tablet Take 1 tablet (100 mg total) by mouth daily. 03/03/23   Hassie Lint, PA-C  meloxicam  (MOBIC ) 7.5 MG tablet Take 1 tablet (7.5 mg total) by mouth daily. 04/20/23   Persons, Norma Beckers, PA  metoprolol  succinate (TOPROL -XL) 50 MG 24 hr tablet Take 1 tablet (50 mg total) by mouth daily. Take with or immediately following a meal. 06/09/22   Vernell Goldsmith, MD  nystatin  cream (MYCOSTATIN ) Apply to affected areas 3 (three) times daily. Patient taking differently: Apply 1 Application topically as needed (irritation). 08/17/22  Vernell Goldsmith, MD  nystatin  ointment (MYCOSTATIN ) Apply 1 Application topically 2 (two) times daily. Mix with triamcinolone . Use for 2 weeks. 04/20/23   Dellar Fenton, DO  pantoprazole  (PROTONIX ) 40 MG tablet Take 1 tablet (40 mg total) by mouth daily. 03/03/23   McClung, Angela M, PA-C  predniSONE  (DELTASONE ) 10 MG tablet Take 1 tablet (10 mg total) by mouth daily with breakfast. 03/24/23   Lawrance Presume, MD  pregabalin  (LYRICA ) 100 MG capsule Take 1 capsule (100 mg total) by mouth 2 (two) times daily. Patient taking differently: Take 100 mg by mouth daily. 02/02/23   Lawrance Presume, MD  terbinafine  (LAMISIL ) 250 MG tablet Take 1 tablet (250 mg total) by mouth daily. 04/20/23   Dellar Fenton, DO  triamcinolone  (KENALOG ) 0.025 % ointment Apply 1 Application topically 2 (two) times daily. Mix with nystatin . Use for 2 weeks. 04/20/23   Dellar Fenton, DO      Allergies    Patient has no known allergies.    Review of Systems   Review of Systems  Constitutional:  Negative for fever.  Respiratory:  Positive for cough and shortness of breath. Negative for hemoptysis and sputum production.   Cardiovascular:  Negative for chest pain.  Gastrointestinal:  Negative for abdominal pain.    Physical Exam Updated Vital Signs BP (!) 99/56   Pulse 83   Temp 98.5 F (36.9 C) (Oral)   Resp (!) 25   Ht 5\' 9"  (1.753 m)   Wt 91.6 kg   SpO2 91%   BMI 29.83 kg/m  Physical Exam Vitals and nursing note reviewed.  Constitutional:      General: He is not in acute distress.    Appearance: He is well-developed.  HENT:     Head: Normocephalic and atraumatic.  Eyes:     Conjunctiva/sclera: Conjunctivae normal.  Cardiovascular:     Rate and Rhythm: Normal rate and regular rhythm.     Heart sounds: No murmur heard. Pulmonary:     Effort: Tachypnea and accessory muscle usage present. No respiratory distress.     Breath sounds: Normal breath sounds. Decreased air movement present.  Abdominal:     Palpations: Abdomen is soft.     Tenderness: There is no abdominal tenderness.  Musculoskeletal:        General: No swelling.     Cervical back: Neck supple.  Skin:    General: Skin is warm and dry.     Capillary Refill: Capillary refill takes less than 2 seconds.  Neurological:     Mental Status: He is alert.  Psychiatric:        Mood and Affect: Mood normal.     ED Results / Procedures / Treatments   Labs (all labs ordered are listed, but only abnormal results are displayed) Labs Reviewed - No data to display  EKG None  Radiology No results  found.  Procedures Procedures  {Document cardiac monitor, telemetry assessment procedure when appropriate:1}  Medications Ordered in ED Medications  albuterol  (PROVENTIL ,VENTOLIN ) solution continuous neb (has no administration in time range)    ED Course/ Medical Decision Making/ A&P   {   Click here for ABCD2, HEART and other calculatorsREFRESH Note before signing :1}                              Medical Decision Making Amount and/or Complexity of Data Reviewed Labs: ordered. Radiology: ordered.  This patient complains of ***; this involves an extensive number of treatment Options and is a complaint that carries with it a high risk of complications and morbidity. The differential includes ***  I ordered, reviewed and interpreted labs, which included *** I ordered medication *** and reviewed PMP when indicated. I ordered imaging studies which included *** and I independently    visualized and interpreted imaging which showed *** Additional history obtained from *** Previous records obtained and reviewed *** I consulted *** and discussed lab and imaging findings and discussed disposition.  Cardiac monitoring reviewed, *** Social determinants considered, *** Critical Interventions: ***  After the interventions stated above, I reevaluated the patient and found *** Admission and further testing considered, ***   {Document critical care time when appropriate:1} {Document review of labs and clinical decision tools ie heart score, Chads2Vasc2 etc:1}  {Document your independent review of radiology images, and any outside records:1} {Document your discussion with family members, caretakers, and with consultants:1} {Document social determinants of health affecting pt's care:1} {Document your decision making why or why not admission, treatments were needed:1} Final Clinical Impression(s) / ED Diagnoses Final diagnoses:  None    Rx / DC Orders ED Discharge Orders     None

## 2023-05-09 ENCOUNTER — Other Ambulatory Visit: Payer: Self-pay

## 2023-05-16 ENCOUNTER — Other Ambulatory Visit (HOSPITAL_COMMUNITY): Payer: Self-pay

## 2023-05-18 ENCOUNTER — Other Ambulatory Visit (HOSPITAL_COMMUNITY): Payer: Self-pay

## 2023-05-18 ENCOUNTER — Other Ambulatory Visit: Payer: Self-pay

## 2023-05-30 ENCOUNTER — Emergency Department (HOSPITAL_COMMUNITY)
Admission: EM | Admit: 2023-05-30 | Discharge: 2023-05-30 | Disposition: A | Discharge status: Home / Self Care | Attending: Emergency Medicine | Admitting: Emergency Medicine

## 2023-05-30 ENCOUNTER — Emergency Department (HOSPITAL_COMMUNITY)

## 2023-05-30 ENCOUNTER — Encounter (HOSPITAL_COMMUNITY): Payer: Self-pay

## 2023-05-30 ENCOUNTER — Other Ambulatory Visit: Payer: Self-pay

## 2023-05-30 DIAGNOSIS — F1410 Cocaine abuse, uncomplicated: Secondary | ICD-10-CM | POA: Insufficient documentation

## 2023-05-30 DIAGNOSIS — R4182 Altered mental status, unspecified: Secondary | ICD-10-CM | POA: Diagnosis not present

## 2023-05-30 DIAGNOSIS — J441 Chronic obstructive pulmonary disease with (acute) exacerbation: Secondary | ICD-10-CM | POA: Diagnosis not present

## 2023-05-30 DIAGNOSIS — R062 Wheezing: Secondary | ICD-10-CM | POA: Diagnosis not present

## 2023-05-30 DIAGNOSIS — R0902 Hypoxemia: Secondary | ICD-10-CM | POA: Diagnosis not present

## 2023-05-30 DIAGNOSIS — I700 Atherosclerosis of aorta: Secondary | ICD-10-CM | POA: Diagnosis not present

## 2023-05-30 DIAGNOSIS — S2243XA Multiple fractures of ribs, bilateral, initial encounter for closed fracture: Secondary | ICD-10-CM | POA: Diagnosis not present

## 2023-05-30 DIAGNOSIS — R0602 Shortness of breath: Secondary | ICD-10-CM | POA: Diagnosis not present

## 2023-05-30 DIAGNOSIS — J929 Pleural plaque without asbestos: Secondary | ICD-10-CM | POA: Diagnosis not present

## 2023-05-30 DIAGNOSIS — I10 Essential (primary) hypertension: Secondary | ICD-10-CM | POA: Diagnosis not present

## 2023-05-30 DIAGNOSIS — E875 Hyperkalemia: Secondary | ICD-10-CM | POA: Diagnosis not present

## 2023-05-30 LAB — RAPID URINE DRUG SCREEN, HOSP PERFORMED
Amphetamines: NOT DETECTED
Barbiturates: NOT DETECTED
Benzodiazepines: NOT DETECTED
Cocaine: POSITIVE — AB
Opiates: NOT DETECTED
Tetrahydrocannabinol: NOT DETECTED

## 2023-05-30 LAB — I-STAT VENOUS BLOOD GAS, ED
Acid-Base Excess: 1 mmol/L (ref 0.0–2.0)
Acid-Base Excess: 2 mmol/L (ref 0.0–2.0)
Bicarbonate: 30.2 mmol/L — ABNORMAL HIGH (ref 20.0–28.0)
Bicarbonate: 29.2 mmol/L — ABNORMAL HIGH (ref 20.0–28.0)
Calcium, Ion: 1.19 mmol/L (ref 1.15–1.40)
Calcium, Ion: 1.16 mmol/L (ref 1.15–1.40)
HCT: 42 % (ref 39.0–52.0)
HCT: 41 % (ref 39.0–52.0)
Hemoglobin: 14.3 g/dL (ref 13.0–17.0)
Hemoglobin: 13.9 g/dL (ref 13.0–17.0)
O2 Saturation: 81 %
O2 Saturation: 82 %
Potassium: 5.4 mmol/L — ABNORMAL HIGH (ref 3.5–5.1)
Potassium: 5.4 mmol/L — ABNORMAL HIGH (ref 3.5–5.1)
Sodium: 135 mmol/L (ref 135–145)
Sodium: 134 mmol/L — ABNORMAL LOW (ref 135–145)
TCO2: 32 mmol/L (ref 22–32)
TCO2: 31 mmol/L (ref 22–32)
pCO2, Ven: 65.4 mmHg — ABNORMAL HIGH (ref 44–60)
pCO2, Ven: 54.4 mmHg (ref 44–60)
pH, Ven: 7.272 (ref 7.25–7.43)
pH, Ven: 7.338 (ref 7.25–7.43)
pO2, Ven: 53 mmHg — ABNORMAL HIGH (ref 32–45)
pO2, Ven: 50 mmHg — ABNORMAL HIGH (ref 32–45)

## 2023-05-30 LAB — URINALYSIS, ROUTINE W REFLEX MICROSCOPIC
Bilirubin Urine: NEGATIVE
Glucose, UA: 150 mg/dL — AB
Hgb urine dipstick: NEGATIVE
Ketones, ur: NEGATIVE mg/dL
Leukocytes,Ua: NEGATIVE
Nitrite: NEGATIVE
Protein, ur: NEGATIVE mg/dL
Specific Gravity, Urine: 1.005 (ref 1.005–1.030)
pH: 5 (ref 5.0–8.0)

## 2023-05-30 LAB — COMPREHENSIVE METABOLIC PANEL WITH GFR
ALT: 21 U/L (ref 0–44)
AST: 24 U/L (ref 15–41)
Albumin: 3.6 g/dL (ref 3.5–5.0)
Alkaline Phosphatase: 37 U/L — ABNORMAL LOW (ref 38–126)
Anion gap: 8 (ref 5–15)
BUN: 26 mg/dL — ABNORMAL HIGH (ref 8–23)
CO2: 28 mmol/L (ref 22–32)
Calcium: 8.6 mg/dL — ABNORMAL LOW (ref 8.9–10.3)
Chloride: 99 mmol/L (ref 98–111)
Creatinine, Ser: 1.19 mg/dL (ref 0.61–1.24)
GFR, Estimated: >60 mL/min (ref >60)
Glucose, Bld: 111 mg/dL — ABNORMAL HIGH (ref 70–99)
Potassium: 5.5 mmol/L — ABNORMAL HIGH (ref 3.5–5.1)
Sodium: 135 mmol/L (ref 135–145)
Total Bilirubin: 0.7 mg/dL (ref 0.0–1.2)
Total Protein: 5.8 g/dL — ABNORMAL LOW (ref 6.5–8.1)

## 2023-05-30 LAB — CBC
HCT: 40.8 % (ref 39.0–52.0)
Hemoglobin: 13.1 g/dL (ref 13.0–17.0)
MCH: 31.8 pg (ref 26.0–34.0)
MCHC: 32.1 g/dL (ref 30.0–36.0)
MCV: 99 fL (ref 80.0–100.0)
Platelets: 221 10*3/uL (ref 150–400)
RBC: 4.12 MIL/uL — ABNORMAL LOW (ref 4.22–5.81)
RDW: 13.9 % (ref 11.5–15.5)
WBC: 11.3 10*3/uL — ABNORMAL HIGH (ref 4.0–10.5)
nRBC: 0 % (ref 0.0–0.2)

## 2023-05-30 LAB — D-DIMER, QUANTITATIVE: D-Dimer, Quant: 0.3 ug{FEU}/mL (ref 0.00–0.50)

## 2023-05-30 LAB — TROPONIN I (HIGH SENSITIVITY)
Troponin I (High Sensitivity): 9 ng/L (ref <18)
Troponin I (High Sensitivity): 7 ng/L (ref <18)

## 2023-05-30 LAB — ETHANOL: Alcohol, Ethyl (B): 118 mg/dL — ABNORMAL HIGH (ref <15)

## 2023-05-30 LAB — BRAIN NATRIURETIC PEPTIDE: B Natriuretic Peptide: 40.2 pg/mL (ref 0.0–100.0)

## 2023-05-30 MED ORDER — ALBUTEROL SULFATE (2.5 MG/3ML) 0.083% IN NEBU
10.0000 mg/h | INHALATION_SOLUTION | RESPIRATORY_TRACT | Status: DC
  Administered 2023-05-30: 10 mg/h via RESPIRATORY_TRACT
  Filled 2023-05-30: qty 12

## 2023-05-30 MED ORDER — SODIUM ZIRCONIUM CYCLOSILICATE 10 G PO PACK
10.0000 g | PACK | Freq: Once | ORAL | Status: AC
  Administered 2023-05-30: 10 g via ORAL
  Filled 2023-05-30: qty 1

## 2023-05-30 MED ORDER — PREDNISONE 10 MG PO TABS
40.0000 mg | ORAL_TABLET | Freq: Every day | ORAL | 0 refills | Status: DC
  Filled 2023-05-30 – 2023-08-24 (×5): qty 20, 5d supply, fill #0

## 2023-05-30 NOTE — ED Provider Notes (Signed)
 [place] EMERGENCY DEPARTMENT AT [facility] Provider Note   CSN: [id number] Arrival date & time: 05/30/23  0035     History  Chief Complaint  Patient presents with   Shortness of Breath    James [name] is a 63 y.o. male.  The history is provided by the patient, the EMS personnel and medical records.  Shortness of Breath James [name] is a 63 y.o. male who presents to the Emergency Department complaining of shortness of breath.  He presents to the emergency department by EMS for evaluation of shortness of breath that started suddenly around 11 PM and woke him from sleep.  He has a history of COPD, alcohol use disorder.  He states that he was in his routine state of health when he went to sleep, woke up short of breath.  He also reports a couple days of bilateral lower extremity edema.  No associate chest pain, fever, abdominal pain, vomiting.  EMS reports sats in the mid 80s.  He was treated with Solu-Medrol , DuoNeb.  Air movement and sats improved after interventions.      Home Medications Prior to Admission medications   Medication Sig Start Date End Date Taking? Authorizing Provider  predniSONE  (DELTASONE ) 10 MG tablet Take 4 tablets (40 mg total) by mouth daily. 05/30/23  Yes Kelsey Patricia, MD  albuterol  (PROVENTIL ) (2.5 MG/3ML) 0.083% nebulizer solution Inhale 3 mLs (2.5 mg total) by nebulization every 4 (four) hours as needed for wheezing or shortness of breath. 03/03/23   McClung, Angela M, PA-C  albuterol  (VENTOLIN  HFA) 108 (90 Base) MCG/ACT inhaler Inhale 2 puffs into the lungs every 6 (six) hours as needed for wheezing or shortness of breath. 03/03/23   Hassie Lint, PA-C  atorvastatin  (LIPITOR) 10 MG tablet Take 1 tablet (10 mg total) by mouth daily. 04/28/23   Lawrance Presume, MD  budeson-glycopyrrolate -formoterol  (BREZTRI  AEROSPHERE) 160-9-4.8 MCG/ACT AERO inhaler Inhale 2 puffs into the lungs in the morning and at bedtime. 09/28/22   Vernell Goldsmith,  MD  folic acid  (FOLVITE ) 1 MG tablet Take 1 tablet (1 mg total) by mouth daily. 09/28/22   Vernell Goldsmith, MD  furosemide  (LASIX ) 40 MG tablet Take 1 tablet (40 mg total) by mouth daily. 03/03/23   McClung, Angela M, PA-C  glipiZIDE  (GLUCOTROL  XL) 2.5 MG 24 hr tablet Take 1 tablet (2.5 mg total) by mouth daily with breakfast. 04/28/23   Lawrance Presume, MD  LORazepam  (ATIVAN ) 0.5 MG tablet 1 tab tid today and tomorrow then 1 tab twice daily for 3 days then 1 tab daily until gone.  DO NOT DRINK ALCOHOL WHILE ON THIS MEDICATION.  THIS IS TO TAPER OFF ALCOHOL 03/03/23   Hassie Lint, PA-C  losartan  (COZAAR ) 100 MG tablet Take 1 tablet (100 mg total) by mouth daily. 03/03/23   Hassie Lint, PA-C  meloxicam  (MOBIC ) 7.5 MG tablet Take 1 tablet (7.5 mg total) by mouth daily. 04/20/23   Persons, Norma Beckers, PA  metoprolol  succinate (TOPROL -XL) 50 MG 24 hr tablet Take 1 tablet (50 mg total) by mouth daily. Take with or immediately following a meal. 06/09/22   Vernell Goldsmith, MD  nystatin  cream (MYCOSTATIN ) Apply to affected areas 3 (three) times daily. Patient taking differently: Apply 1 Application topically as needed (irritation). 08/17/22   Vernell Goldsmith, MD  nystatin  ointment (MYCOSTATIN ) Apply 1 Application topically 2 (two) times daily. Mix with triamcinolone . Use for 2 weeks. 04/20/23   Louana Roup  N, DO  pantoprazole  (PROTONIX ) 40 MG tablet Take 1 tablet (40 mg total) by mouth daily. 03/03/23   Hassie Lint, PA-C  predniSONE  (DELTASONE ) 10 MG tablet Take 1 tablet (10 mg total) by mouth daily with breakfast. 03/24/23   Lawrance Presume, MD  pregabalin  (LYRICA ) 100 MG capsule Take 1 capsule (100 mg total) by mouth 2 (two) times daily. Patient taking differently: Take 100 mg by mouth daily. 02/02/23   Lawrance Presume, MD  terbinafine  (LAMISIL ) 250 MG tablet Take 1 tablet (250 mg total) by mouth daily. 04/20/23   Dellar Fenton, DO  triamcinolone  (KENALOG ) 0.025 % ointment Apply 1  Application topically 2 (two) times daily. Mix with nystatin . Use for 2 weeks. 04/20/23   Dellar Fenton, DO      Allergies    Patient has no known allergies.    Review of Systems   Review of Systems  Respiratory:  Positive for shortness of breath.   All other systems reviewed and are negative.   Physical Exam Updated Vital Signs BP 113/63   Pulse 80   Temp 98 F (36.7 C) (Oral)   Resp 17   Ht 5\' 9"  (1.753 m)   Wt 94.8 kg   SpO2 92%   BMI 30.86 kg/m  Physical Exam Vitals and nursing note reviewed.  Constitutional:      Appearance: He is well-developed.  HENT:     Head: Normocephalic and atraumatic.  Cardiovascular:     Rate and Rhythm: Normal rate and regular rhythm.     Heart sounds: No murmur heard. Pulmonary:     Effort: Pulmonary effort is normal. No respiratory distress.     Comments: Decreased air movement bilaterally Abdominal:     Palpations: Abdomen is soft.     Tenderness: There is no abdominal tenderness. There is no guarding or rebound.  Musculoskeletal:        General: No tenderness.     Comments: 1+ pitting edema to bilateral lower extremities  Skin:    General: Skin is warm and dry.  Neurological:     Mental Status: He is alert and oriented to person, place, and time.  Psychiatric:        Behavior: Behavior normal.     ED Results / Procedures / Treatments   Labs (all labs ordered are listed, but only abnormal results are displayed) Labs Reviewed  CBC - Abnormal; Notable for the following components:      Result Value   WBC 11.3 (*)    RBC 4.12 (*)    All other components within normal limits  COMPREHENSIVE METABOLIC PANEL WITH GFR - Abnormal; Notable for the following components:   Potassium 5.5 (*)    Glucose, Bld 111 (*)    BUN 26 (*)    Calcium  8.6 (*)    Total Protein 5.8 (*)    Alkaline Phosphatase 37 (*)    All other components within normal limits  ETHANOL - Abnormal; Notable for the following components:   Alcohol, Ethyl  (B) 118 (*)    All other components within normal limits  URINALYSIS, ROUTINE W REFLEX MICROSCOPIC - Abnormal; Notable for the following components:   Color, Urine STRAW (*)    Glucose, UA 150 (*)    All other components within normal limits  RAPID URINE DRUG SCREEN, HOSP PERFORMED - Abnormal; Notable for the following components:   Cocaine POSITIVE (*)    All other components within normal limits  I-STAT VENOUS BLOOD  GAS, ED - Abnormal; Notable for the following components:   pCO2, Ven 65.4 (*)    pO2, Ven 53 (*)    Bicarbonate 30.2 (*)    Potassium 5.4 (*)    All other components within normal limits  I-STAT VENOUS BLOOD GAS, ED - Abnormal; Notable for the following components:   pO2, Ven 50 (*)    Bicarbonate 29.2 (*)    Sodium 134 (*)    Potassium 5.4 (*)    All other components within normal limits  BRAIN NATRIURETIC PEPTIDE  D-DIMER, QUANTITATIVE  TROPONIN I (HIGH SENSITIVITY)  TROPONIN I (HIGH SENSITIVITY)    EKG EKG Interpretation Date/Time:  Monday May 30 2023 00:47:10 EDT Ventricular Rate:  81 PR Interval:  191 QRS Duration:  109 QT Interval:  370 QTC Calculation: 430 R Axis:   67  Text Interpretation: Sinus rhythm Low voltage, precordial leads Consider anterior infarct Confirmed by Kelsey Patricia [phone]) on 05/30/2023 1:13:20 AM  Radiology CT Head Wo Contrast Result Date: 05/30/2023 CLINICAL DATA:  Mental status change with unknown cause EXAM: CT HEAD WITHOUT CONTRAST TECHNIQUE: Contiguous axial images were obtained from the base of the skull through the vertex without intravenous contrast. RADIATION DOSE REDUCTION: This exam was performed according to the departmental dose-optimization program which includes automated exposure control, adjustment of the mA and/or kV according to patient size and/or use of iterative reconstruction technique. COMPARISON:  08/11/2020 FINDINGS: Brain: Very motion degraded. No gross infarct, hemorrhage, hydrocephalus, mass, or shift.  There is a low-density and chronic appearing lacune at the left caudate head. Vascular: No hyperdense vessel or unexpected calcification. Skull: Normal. Negative for fracture or focal lesion. Sinuses/Orbits: No acute finding. Other: Some left mastoid opacification is suspected, although very limited by motion. IMPRESSION: Very motion degraded with numerous nondiagnostic slices. No acute finding. 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 Electronically Signed   By: Ronnette Coke M.D.   On: 05/30/2023 05:15   DG Chest Portable 1 View Result Date: 05/30/2023 CLINICAL DATA:  Shortness of breath EXAM: PORTABLE CHEST 1 VIEW COMPARISON:  05/08/2023 FINDINGS: Stable cardiomediastinal silhouette. Aortic atherosclerotic calcification. No focal consolidation, pleural effusion, or pneumothorax. Chronic pleural thickening in the left costophrenic angle no displaced rib fractures. Remote bilateral rib fractures. IMPRESSION: No acute cardiopulmonary disease. Electronically Signed   By: Rozell Cornet M.D.   On: 05/30/2023 00:57    Procedures Procedures    Medications Ordered in ED Medications  albuterol  (PROVENTIL ) (2.5 MG/3ML) 0.083% nebulizer solution (0 mg/hr Nebulization Stopped 05/30/23 0354)  sodium zirconium cyclosilicate  (LOKELMA ) packet 10 g (10 g Oral Given 05/30/23 0543)    ED Course/ Medical Decision Making/ A&P                                 Medical Decision Making Amount and/or Complexity of Data Reviewed Labs: ordered. Radiology: ordered.  Risk Prescription drug management.   Patient with history of COPD on 2 L oxygen  at baseline here for evaluation of increased shortness of breath that woke him from sleep.  He does report drinking alcohol tonight.  Patient with decreased air movement bilaterally, he was treated with continuous nebulizer.  He did receive Solu-Medrol  prior to ED arrival.  On repeat assessment after nebulizer patient has improved air movement bilaterally but he has increased  somnolence, difficult to awaken.  This is not consistent with his alcohol level or his prior VBG.  Repeat VBG was obtained, which  does not show retention.  CT head and UDS were added on given this change in mental status. CT head is very limited but there are no acute abnormalities.  UDS is positive for cocaine.  On repeat assessment patient is awake and conversant, has no complaints at this time and his breathing is back at baseline.  He is able to ambulate without any hypoxia.  Discussed with patient his drug use.  Discussed that he needs to avoid cocaine, alcohol in the future.  Current clinical picture is not consistent with ACS, PE, decompensated heart failure, pneumonia.  Given he has dramatic improvement in his symptoms feel he is stable for discharge home with prednisone  burst, ongoing as needed nebs at home with PCP follow-up and return precautions.  Discussed with patient incidental finding of elevated potassium.  Will treat with one-time dose of Lokelma .  Discussed that he will need to follow-up with PCP for recheck.        Final Clinical Impression(s) / ED Diagnoses Final diagnoses:  COPD exacerbation (HCC)  Cocaine abuse (HCC)  Hyperkalemia    Rx / DC Orders ED Discharge Orders          Ordered    predniSONE  (DELTASONE ) 10 MG tablet  Daily        05/30/23 0609              Kelsey Patricia, MD 05/30/23 [phone]

## 2023-05-30 NOTE — ED Triage Notes (Signed)
 Patient BIB [facility] from home due to shortness of breath. Patient states he was awaken out of sleep due to SOB. EMS states on patient normal O2 of 2LNC o2 was 80%. EMS gave 2 duonebs and 125 solumedrol. Patient is A&Ox4.

## 2023-05-30 NOTE — ED Notes (Signed)
 Patient ambulated in hallway. Initially patient had minor instability to which patient stated was due to laying down for a long time. After initial instability patient had steady gait and did not require assistance. Patient states he feels ready to go home.

## 2023-05-31 ENCOUNTER — Other Ambulatory Visit: Payer: Self-pay

## 2023-06-02 ENCOUNTER — Ambulatory Visit: Payer: Medicaid Other | Admitting: Internal Medicine

## 2023-06-02 ENCOUNTER — Other Ambulatory Visit: Payer: Self-pay

## 2023-06-02 ENCOUNTER — Other Ambulatory Visit (HOSPITAL_COMMUNITY): Payer: Self-pay

## 2023-06-05 DIAGNOSIS — F79 Unspecified intellectual disabilities: Secondary | ICD-10-CM | POA: Diagnosis not present

## 2023-06-05 DIAGNOSIS — J9601 Acute respiratory failure with hypoxia: Secondary | ICD-10-CM | POA: Diagnosis not present

## 2023-06-05 DIAGNOSIS — J9621 Acute and chronic respiratory failure with hypoxia: Secondary | ICD-10-CM | POA: Diagnosis not present

## 2023-06-07 DIAGNOSIS — J9601 Acute respiratory failure with hypoxia: Secondary | ICD-10-CM | POA: Diagnosis not present

## 2023-06-07 DIAGNOSIS — F79 Unspecified intellectual disabilities: Secondary | ICD-10-CM | POA: Diagnosis not present

## 2023-06-07 DIAGNOSIS — J9621 Acute and chronic respiratory failure with hypoxia: Secondary | ICD-10-CM | POA: Diagnosis not present

## 2023-06-16 ENCOUNTER — Other Ambulatory Visit: Payer: Self-pay

## 2023-06-16 ENCOUNTER — Other Ambulatory Visit (HOSPITAL_COMMUNITY): Payer: Self-pay

## 2023-06-21 ENCOUNTER — Other Ambulatory Visit: Payer: Self-pay | Admitting: Internal Medicine

## 2023-06-21 ENCOUNTER — Other Ambulatory Visit (HOSPITAL_COMMUNITY): Payer: Self-pay

## 2023-06-21 ENCOUNTER — Other Ambulatory Visit: Payer: Self-pay

## 2023-06-21 ENCOUNTER — Other Ambulatory Visit: Payer: Self-pay | Admitting: Critical Care Medicine

## 2023-06-21 ENCOUNTER — Other Ambulatory Visit: Payer: Self-pay | Admitting: Physician Assistant

## 2023-06-21 MED ORDER — METOPROLOL SUCCINATE ER 50 MG PO TB24
50.0000 mg | ORAL_TABLET | Freq: Every day | ORAL | 2 refills | Status: DC
  Filled 2023-06-21 – 2023-07-01 (×2): qty 90, 90d supply, fill #0
  Filled 2023-10-03: qty 90, 90d supply, fill #1
  Filled 2024-01-06: qty 90, 90d supply, fill #2

## 2023-06-21 MED ORDER — PREGABALIN 100 MG PO CAPS
100.0000 mg | ORAL_CAPSULE | Freq: Two times a day (BID) | ORAL | 5 refills | Status: DC
  Filled 2023-06-21 – 2023-07-01 (×2): qty 60, 30d supply, fill #0
  Filled 2023-08-17 – 2023-08-24 (×3): qty 60, 30d supply, fill #1
  Filled 2023-10-17: qty 60, 30d supply, fill #0
  Filled 2023-12-20 – 2023-12-21 (×3): qty 60, 30d supply, fill #1

## 2023-06-21 MED ORDER — FOLIC ACID 1 MG PO TABS
1.0000 mg | ORAL_TABLET | Freq: Every day | ORAL | 1 refills | Status: DC
  Filled 2023-06-21 – 2023-07-01 (×2): qty 90, 90d supply, fill #0
  Filled 2023-10-03: qty 90, 90d supply, fill #1

## 2023-06-22 ENCOUNTER — Other Ambulatory Visit: Payer: Self-pay

## 2023-06-22 ENCOUNTER — Other Ambulatory Visit (HOSPITAL_COMMUNITY): Payer: Self-pay

## 2023-06-23 ENCOUNTER — Other Ambulatory Visit (HOSPITAL_COMMUNITY): Payer: Self-pay

## 2023-06-28 ENCOUNTER — Other Ambulatory Visit (HOSPITAL_COMMUNITY): Payer: Self-pay

## 2023-07-01 ENCOUNTER — Other Ambulatory Visit (HOSPITAL_COMMUNITY): Payer: Self-pay

## 2023-07-01 ENCOUNTER — Other Ambulatory Visit: Payer: Self-pay

## 2023-07-04 ENCOUNTER — Other Ambulatory Visit: Payer: Self-pay

## 2023-07-05 DIAGNOSIS — J9621 Acute and chronic respiratory failure with hypoxia: Secondary | ICD-10-CM | POA: Diagnosis not present

## 2023-07-05 DIAGNOSIS — F79 Unspecified intellectual disabilities: Secondary | ICD-10-CM | POA: Diagnosis not present

## 2023-07-05 DIAGNOSIS — J9601 Acute respiratory failure with hypoxia: Secondary | ICD-10-CM | POA: Diagnosis not present

## 2023-07-07 DIAGNOSIS — J9621 Acute and chronic respiratory failure with hypoxia: Secondary | ICD-10-CM | POA: Diagnosis not present

## 2023-07-07 DIAGNOSIS — J9601 Acute respiratory failure with hypoxia: Secondary | ICD-10-CM | POA: Diagnosis not present

## 2023-07-07 DIAGNOSIS — F79 Unspecified intellectual disabilities: Secondary | ICD-10-CM | POA: Diagnosis not present

## 2023-07-12 ENCOUNTER — Other Ambulatory Visit: Payer: Self-pay

## 2023-07-19 ENCOUNTER — Other Ambulatory Visit: Payer: Self-pay

## 2023-07-19 ENCOUNTER — Other Ambulatory Visit (HOSPITAL_COMMUNITY): Payer: Self-pay

## 2023-07-27 ENCOUNTER — Ambulatory Visit (INDEPENDENT_AMBULATORY_CARE_PROVIDER_SITE_OTHER): Admitting: Podiatry

## 2023-07-27 DIAGNOSIS — Z91199 Patient's noncompliance with other medical treatment and regimen due to unspecified reason: Secondary | ICD-10-CM

## 2023-07-27 NOTE — Progress Notes (Signed)
 No show

## 2023-08-05 DIAGNOSIS — F79 Unspecified intellectual disabilities: Secondary | ICD-10-CM | POA: Diagnosis not present

## 2023-08-05 DIAGNOSIS — J9621 Acute and chronic respiratory failure with hypoxia: Secondary | ICD-10-CM | POA: Diagnosis not present

## 2023-08-05 DIAGNOSIS — J9601 Acute respiratory failure with hypoxia: Secondary | ICD-10-CM | POA: Diagnosis not present

## 2023-08-07 DIAGNOSIS — J9621 Acute and chronic respiratory failure with hypoxia: Secondary | ICD-10-CM | POA: Diagnosis not present

## 2023-08-07 DIAGNOSIS — J9601 Acute respiratory failure with hypoxia: Secondary | ICD-10-CM | POA: Diagnosis not present

## 2023-08-07 DIAGNOSIS — F79 Unspecified intellectual disabilities: Secondary | ICD-10-CM | POA: Diagnosis not present

## 2023-08-08 ENCOUNTER — Other Ambulatory Visit: Payer: Self-pay

## 2023-08-08 ENCOUNTER — Other Ambulatory Visit (HOSPITAL_COMMUNITY): Payer: Self-pay

## 2023-08-08 ENCOUNTER — Telehealth: Payer: Self-pay | Admitting: *Deleted

## 2023-08-08 DIAGNOSIS — J449 Chronic obstructive pulmonary disease, unspecified: Secondary | ICD-10-CM

## 2023-08-08 DIAGNOSIS — E1141 Type 2 diabetes mellitus with diabetic mononeuropathy: Secondary | ICD-10-CM

## 2023-08-08 DIAGNOSIS — I10 Essential (primary) hypertension: Secondary | ICD-10-CM

## 2023-08-08 NOTE — Progress Notes (Signed)
 Complex Care Management Note Care Guide Note  08/08/2023 Name: SANAV REMER MRN: [id number] DOB: Aug 19, 1960   Complex Care Management Outreach Attempts: An unsuccessful telephone outreach was attempted today to offer the patient information about available complex care management services.  Follow Up Plan:  Additional outreach attempts will be made to offer the patient complex care management information and services.   Encounter Outcome:  No Answer  Harlene Satterfield  [name]  [facility], [facility] Guide  Direct Dial: [phone]  Fax [phone]

## 2023-08-09 NOTE — Progress Notes (Signed)
 Complex Care Management Note Care Guide Note  08/09/2023 Name: James [name] MRN: [id number] DOB: [birth year]   Complex Care Management Outreach Attempts: A second unsuccessful outreach was attempted today to offer the patient with information about available complex care management services.  Follow Up Plan:  Additional outreach attempts will be made to offer the patient complex care management information and services.   Encounter Outcome:  No Answer  Harlene Satterfield  [facility]  [facility], [facility] Guide  Direct Dial: [phone]  Fax [phone]

## 2023-08-12 ENCOUNTER — Emergency Department (HOSPITAL_COMMUNITY)

## 2023-08-12 ENCOUNTER — Other Ambulatory Visit: Payer: Self-pay

## 2023-08-12 ENCOUNTER — Emergency Department (HOSPITAL_COMMUNITY)
Admission: EM | Admit: 2023-08-12 | Discharge: 2023-08-12 | Disposition: A | Discharge status: Home / Self Care | Attending: Emergency Medicine | Admitting: Emergency Medicine

## 2023-08-12 ENCOUNTER — Encounter (HOSPITAL_COMMUNITY): Payer: Self-pay

## 2023-08-12 DIAGNOSIS — N400 Enlarged prostate without lower urinary tract symptoms: Secondary | ICD-10-CM | POA: Diagnosis not present

## 2023-08-12 DIAGNOSIS — Z7984 Long term (current) use of oral hypoglycemic drugs: Secondary | ICD-10-CM | POA: Diagnosis not present

## 2023-08-12 DIAGNOSIS — R109 Unspecified abdominal pain: Secondary | ICD-10-CM | POA: Diagnosis not present

## 2023-08-12 DIAGNOSIS — Z79899 Other long term (current) drug therapy: Secondary | ICD-10-CM | POA: Insufficient documentation

## 2023-08-12 DIAGNOSIS — K429 Umbilical hernia without obstruction or gangrene: Secondary | ICD-10-CM | POA: Diagnosis not present

## 2023-08-12 DIAGNOSIS — R1032 Left lower quadrant pain: Secondary | ICD-10-CM | POA: Diagnosis not present

## 2023-08-12 LAB — URINALYSIS, ROUTINE W REFLEX MICROSCOPIC
Bacteria, UA: NONE SEEN
Bilirubin Urine: NEGATIVE
Glucose, UA: NEGATIVE mg/dL
Hgb urine dipstick: NEGATIVE
Ketones, ur: NEGATIVE mg/dL
Leukocytes,Ua: NEGATIVE
Nitrite: NEGATIVE
Protein, ur: NEGATIVE mg/dL
Specific Gravity, Urine: 1.021 (ref 1.005–1.030)
pH: 5 (ref 5.0–8.0)

## 2023-08-12 LAB — COMPREHENSIVE METABOLIC PANEL WITH GFR
ALT: 29 U/L (ref 0–44)
AST: 29 U/L (ref 15–41)
Albumin: 4.2 g/dL (ref 3.5–5.0)
Alkaline Phosphatase: 44 U/L (ref 38–126)
Anion gap: 11 (ref 5–15)
BUN: 14 mg/dL (ref 8–23)
CO2: 27 mmol/L (ref 22–32)
Calcium: 9.7 mg/dL (ref 8.9–10.3)
Chloride: 102 mmol/L (ref 98–111)
Creatinine, Ser: 1.11 mg/dL (ref 0.61–1.24)
GFR, Estimated: >60 mL/min (ref >60)
Glucose, Bld: 139 mg/dL — ABNORMAL HIGH (ref 70–99)
Potassium: 4.5 mmol/L (ref 3.5–5.1)
Sodium: 140 mmol/L (ref 135–145)
Total Bilirubin: 0.5 mg/dL (ref 0.0–1.2)
Total Protein: 6.8 g/dL (ref 6.5–8.1)

## 2023-08-12 LAB — CBC WITH DIFFERENTIAL/PLATELET
Abs Immature Granulocytes: 0.04 K/uL (ref 0.00–0.07)
Basophils Absolute: 0.1 K/uL (ref 0.0–0.1)
Basophils Relative: 1 %
Eosinophils Absolute: 0 K/uL (ref 0.0–0.5)
Eosinophils Relative: 0 %
HCT: 43.1 % (ref 39.0–52.0)
Hemoglobin: 13.7 g/dL (ref 13.0–17.0)
Immature Granulocytes: 0 %
Lymphocytes Relative: 16 %
Lymphs Abs: 1.7 K/uL (ref 0.7–4.0)
MCH: 31.1 pg (ref 26.0–34.0)
MCHC: 31.8 g/dL (ref 30.0–36.0)
MCV: 97.7 fL (ref 80.0–100.0)
Monocytes Absolute: 0.8 K/uL (ref 0.1–1.0)
Monocytes Relative: 8 %
Neutro Abs: 8.2 K/uL — ABNORMAL HIGH (ref 1.7–7.7)
Neutrophils Relative %: 75 %
Platelets: 279 K/uL (ref 150–400)
RBC: 4.41 MIL/uL (ref 4.22–5.81)
RDW: 12.9 % (ref 11.5–15.5)
WBC: 10.8 K/uL — ABNORMAL HIGH (ref 4.0–10.5)
nRBC: 0 % (ref 0.0–0.2)

## 2023-08-12 MED ORDER — KETOROLAC TROMETHAMINE 30 MG/ML IJ SOLN
30.0000 mg | Freq: Once | INTRAMUSCULAR | Status: AC
  Administered 2023-08-12: 30 mg via INTRAMUSCULAR
  Filled 2023-08-12: qty 1

## 2023-08-12 MED ORDER — LIDOCAINE 5 % EX PTCH
1.0000 | MEDICATED_PATCH | CUTANEOUS | Status: DC
  Administered 2023-08-12: 1 via TRANSDERMAL
  Filled 2023-08-12: qty 1

## 2023-08-12 MED ORDER — IBUPROFEN 400 MG PO TABS
600.0000 mg | ORAL_TABLET | Freq: Once | ORAL | Status: AC
  Administered 2023-08-12: 600 mg via ORAL
  Filled 2023-08-12: qty 1

## 2023-08-12 NOTE — ED Notes (Signed)
 PT D/C'D AFTER INSTRUCTIONS REVIEWED. PT VERBALIZED UNDERSTANDING. NAD REPORTED OR NOTED AT THIS TIME.

## 2023-08-12 NOTE — ED Provider Triage Note (Addendum)
 Emergency Medicine Provider Triage Evaluation Note  James [name] , a 63 y.o. male  was evaluated in triage.  Pt complains of constant left flank pain that started today. Worsens with palpation and movement. Has not tried otc meds. Denies urinary symptoms, hx of stone, known injury  Review of Systems  Positive: See hpi Negative: Fevers, urinary symptoms  Physical Exam  BP (!) 149/98 (BP Location: Left Arm)   Pulse 69   Temp 97.6 F (36.4 C)   Resp 20   Ht 5' 9 (1.753 m)   Wt 91.6 kg   SpO2 96%   BMI 29.83 kg/m  Gen:   Awake, no distress   Resp:  Normal effort  MSK:   Moves extremities without difficulty  Other:  No abd TTP. TTP of left flank and left side  Medical Decision Making  Medically screening exam initiated at 3:47 PM.  Appropriate orders placed.  James [name] was informed that the remainder of the evaluation will be completed by another provider, this initial triage assessment does not replace that evaluation, and the importance of remaining in the ED until their evaluation is complete.  Labs and renal study ordered to r/o stone, UTI. Analgesia given    Minnie Tinnie BRAVO, PA 08/12/23 1548    Minnie Tinnie BRAVO, PA 08/12/23 1550

## 2023-08-12 NOTE — ED Provider Notes (Signed)
 [place] EMERGENCY DEPARTMENT AT [facility] Provider Note   CSN: [id number] Arrival date & time: 08/12/23  1531     Patient presents with: Flank Pain   James [name] is a 63 y.o. male here with left flank pain onset earlier today. Worse with movement.  Concerned about kidney stone.  No nausea/vomiting/constipation   HPI     Prior to Admission medications   Medication Sig Start Date End Date Taking? Authorizing Provider  albuterol  (PROVENTIL ) (2.5 MG/3ML) 0.083% nebulizer solution Inhale 3 mLs (2.5 mg total) by nebulization every 4 (four) hours as needed for wheezing or shortness of breath. 03/03/23   McClung, Angela M, PA-C  albuterol  (VENTOLIN  HFA) 108 (90 Base) MCG/ACT inhaler Inhale 2 puffs into the lungs every 6 (six) hours as needed for wheezing or shortness of breath. 03/03/23   Danton Jon HERO, PA-C  atorvastatin  (LIPITOR) 10 MG tablet Take 1 tablet (10 mg total) by mouth daily. 04/28/23   Vicci Barnie NOVAK, MD  budesonide -glycopyrrolate -formoterol  (BREZTRI  AEROSPHERE) 160-9-4.8 MCG/ACT AERO inhaler Inhale 2 puffs into the lungs in the morning and at bedtime. 09/28/22   Brien Belvie BRAVO, MD  folic acid  (FOLVITE ) 1 MG tablet Take 1 tablet (1 mg total) by mouth daily. 06/21/23   Vicci Barnie NOVAK, MD  furosemide  (LASIX ) 40 MG tablet Take 1 tablet (40 mg total) by mouth daily. 03/03/23   McClung, Angela M, PA-C  glipiZIDE  (GLUCOTROL  XL) 2.5 MG 24 hr tablet Take 1 tablet (2.5 mg total) by mouth daily with breakfast. 04/28/23   Vicci Barnie NOVAK, MD  LORazepam  (ATIVAN ) 0.5 MG tablet 1 tab tid today and tomorrow then 1 tab twice daily for 3 days then 1 tab daily until gone.  DO NOT DRINK ALCOHOL WHILE ON THIS MEDICATION.  THIS IS TO TAPER OFF ALCOHOL 03/03/23   Danton Jon HERO, PA-C  losartan  (COZAAR ) 100 MG tablet Take 1 tablet (100 mg total) by mouth daily. 03/03/23   Danton Jon HERO, PA-C  meloxicam  (MOBIC ) 7.5 MG tablet Take 1 tablet (7.5 mg total) by mouth daily.  04/20/23   Persons, Ronal Dragon, PA  metoprolol  succinate (TOPROL -XL) 50 MG 24 hr tablet Take 1 tablet (50 mg total) by mouth daily. Take with or immediately following a meal. 06/21/23   Vicci Barnie NOVAK, MD  nystatin  cream (MYCOSTATIN ) Apply to affected areas 3 (three) times daily. Patient taking differently: Apply 1 Application topically as needed (irritation). 08/17/22   Brien Belvie BRAVO, MD  nystatin  ointment (MYCOSTATIN ) Apply 1 Application topically 2 (two) times daily. Mix with triamcinolone . Use for 2 weeks. 04/20/23   Alm Delon SAILOR, DO  pantoprazole  (PROTONIX ) 40 MG tablet Take 1 tablet (40 mg total) by mouth daily. 03/03/23   Danton Jon HERO, PA-C  predniSONE  (DELTASONE ) 10 MG tablet Take 1 tablet (10 mg total) by mouth daily with breakfast. 03/24/23   Vicci Barnie NOVAK, MD  predniSONE  (DELTASONE ) 10 MG tablet Take 4 tablets (40 mg total) by mouth daily. 05/30/23   Griselda Norris, MD  pregabalin  (LYRICA ) 100 MG capsule Take 1 capsule (100 mg total) by mouth 2 (two) times daily. 06/21/23   Vicci Barnie NOVAK, MD  terbinafine  (LAMISIL ) 250 MG tablet Take 1 tablet (250 mg total) by mouth daily. 04/20/23   Alm Delon SAILOR, DO  triamcinolone  (KENALOG ) 0.025 % ointment Apply 1 Application topically 2 (two) times daily. Mix with nystatin . Use for 2 weeks. 04/20/23   Alm Delon SAILOR, DO    Allergies: Patient has  no known allergies.    Review of Systems  Updated Vital Signs BP 132/87   Pulse 69   Temp 98.5 F (36.9 C)   Resp 18   Ht 5' 9 (1.753 m)   Wt 91.6 kg   SpO2 100%   BMI 29.83 kg/m   Physical Exam Constitutional:      General: He is not in acute distress. HENT:     Head: Normocephalic and atraumatic.  Eyes:     Conjunctiva/sclera: Conjunctivae normal.     Pupils: Pupils are equal, round, and reactive to light.  Cardiovascular:     Rate and Rhythm: Normal rate and regular rhythm.  Pulmonary:     Effort: Pulmonary effort is normal. No respiratory distress.   Abdominal:     General: There is no distension.     Tenderness: There is no abdominal tenderness.  Musculoskeletal:     Comments: Left flank tenderness on exam, worse with standing  Skin:    General: Skin is warm and dry.  Neurological:     General: No focal deficit present.     Mental Status: He is alert and oriented to person, place, and time. Mental status is at baseline.  Psychiatric:        Mood and Affect: Mood normal.        Behavior: Behavior normal.     (all labs ordered are listed, but only abnormal results are displayed) Labs Reviewed  CBC WITH DIFFERENTIAL/PLATELET - Abnormal; Notable for the following components:      Result Value   WBC 10.8 (*)    Neutro Abs 8.2 (*)    All other components within normal limits  COMPREHENSIVE METABOLIC PANEL WITH GFR - Abnormal; Notable for the following components:   Glucose, Bld 139 (*)    All other components within normal limits  URINALYSIS, ROUTINE W REFLEX MICROSCOPIC    EKG: None  Radiology: CT Renal Stone Study Result Date: 08/12/2023 CLINICAL DATA:  Left flank pain EXAM: CT ABDOMEN AND PELVIS WITHOUT CONTRAST TECHNIQUE: Multidetector CT imaging of the abdomen and pelvis was performed following the standard protocol without IV contrast. RADIATION DOSE REDUCTION: This exam was performed according to the departmental dose-optimization program which includes automated exposure control, adjustment of the mA and/or kV according to patient size and/or use of iterative reconstruction technique. COMPARISON:  CT abdomen and pelvis 06/04/2013 FINDINGS: Lower chest: There is a 4 mm right lower lobe nodule, new from prior. There is atelectasis in the left lung base. Hepatobiliary: No focal liver abnormality is seen. No gallstones, gallbladder wall thickening, or biliary dilatation. Pancreas: Unremarkable. No pancreatic ductal dilatation or surrounding inflammatory changes. Spleen: Normal in size without focal abnormality. Adrenals/Urinary  Tract: Kidneys and adrenal glands are within normal limits. The bladder is under distended, but otherwise within normal limits. Stomach/Bowel: Stomach is within normal limits. Appendix appears normal. No evidence of bowel wall thickening, distention, or inflammatory changes. Vascular/Lymphatic: Aortic atherosclerosis. No enlarged abdominal or pelvic lymph nodes. Reproductive: Prostate gland is mildly enlarged. Other: There is a small fat containing left inguinal and small fat containing umbilical hernia. There is no ascites. Musculoskeletal: No acute osseous abnormality IMPRESSION: 1. No acute localizing process in the abdomen or pelvis. 2. Small fat containing left inguinal and umbilical hernias. 3. 4 mm right solid pulmonary nodule. No follow-up needed if patient is low-risk.This recommendation follows the consensus statement: Guidelines for Management of Incidental Pulmonary Nodules Detected on CT Images: From the Fleischner Society 2017; Radiology 2017; 284:228-243.  Aortic Atherosclerosis (ICD10-I70.0). Electronically Signed   By: Greig Pique M.D.   On: 08/12/2023 17:58     Procedures   Medications Ordered in the ED  ibuprofen  (ADVIL ) tablet 600 mg (600 mg Oral Given 08/12/23 1553)  ketorolac  (TORADOL ) 30 MG/ML injection 30 mg (30 mg Intramuscular Given 08/12/23 2247)    Clinical Course as of 08/13/23 1428  Fri Aug 12, 2023  2232 Pt eating, looking better, awaiting UA [MT]    Clinical Course User Index [MT] Cottie Donnice PARAS, MD                                 Medical Decision Making Risk Prescription drug management.   Ddx includes paralumbar strain vs ureteral colic vs nerve pain vs other  Labs, CT imaging personally reviewed - no emergent findings  IM and oral pain medications given with improvement  Suspect likely MSK pain  He has incidental hernias noted on CT but these do not correlate with his pain on exam. Incidental pulm nodule noted.  Doubt intraabdominal  emergency/AAA/sepsis as a cause of this pain, which is muscular in nature     Final diagnoses:  Flank pain    ED Discharge Orders     None          Cottie Donnice PARAS, MD 08/13/23 1429

## 2023-08-12 NOTE — ED Triage Notes (Signed)
 Pt to ED via [facility] from home c/o left side flank pain that started today. Denies urinary symptoms. No medication given by ems.   97%RA, 152/94, hr  70

## 2023-08-15 NOTE — Progress Notes (Signed)
 Complex Care Management Note  Care Guide Note 08/15/2023 Name: JEMAR PAULSEN MRN: [id number] DOB: 18-Jun-1960  ERNIE KASLER is a 63 y.o. year old male who sees Vicci Barnie NOVAK, MD for primary care. I reached out to Beryl JONETTA Lunger by phone today to offer complex care management services.  Mr. Tousley was given information about Complex Care Management services today including:   The Complex Care Management services include support from the care team which includes your Nurse Care Manager, Clinical Social Worker, or Pharmacist.  The Complex Care Management team is here to help remove barriers to the health concerns and goals most important to you. Complex Care Management services are voluntary, and the patient may decline or stop services at any time by request to their care team member.   Complex Care Management Consent Status: Patient agreed to services and verbal consent obtained.   Follow up plan:  Telephone appointment with complex care management team member scheduled for:  8/13  Encounter Outcome:  Patient Scheduled  Harlene Satterfield  [facility], [facility]  [facility], [facility] Guide  Direct Dial: [phone]  Fax [phone]

## 2023-08-17 ENCOUNTER — Other Ambulatory Visit: Payer: Self-pay

## 2023-08-17 ENCOUNTER — Encounter: Payer: Self-pay | Admitting: *Deleted

## 2023-08-17 ENCOUNTER — Telehealth: Payer: Self-pay | Admitting: *Deleted

## 2023-08-17 NOTE — Patient Instructions (Signed)
 James [name] - I am sorry I was unable to reach you today for our scheduled appointment. I work with Vicci Barnie NOVAK, MD and am calling to support your healthcare needs. Please contact me at [phone] at your earliest convenience. I look forward to speaking with you soon.   Thank you,  Bulmaro Feagans, RN, BSN, ACM RN Care Manager [name] [phone]

## 2023-08-22 ENCOUNTER — Other Ambulatory Visit: Payer: Self-pay

## 2023-08-22 ENCOUNTER — Other Ambulatory Visit (HOSPITAL_COMMUNITY): Payer: Self-pay

## 2023-08-24 ENCOUNTER — Other Ambulatory Visit: Payer: Self-pay

## 2023-08-24 ENCOUNTER — Other Ambulatory Visit (HOSPITAL_COMMUNITY): Payer: Self-pay

## 2023-08-29 ENCOUNTER — Ambulatory Visit: Admitting: Internal Medicine

## 2023-09-06 ENCOUNTER — Other Ambulatory Visit: Payer: Self-pay

## 2023-09-06 ENCOUNTER — Other Ambulatory Visit: Payer: Self-pay | Admitting: Internal Medicine

## 2023-09-06 ENCOUNTER — Other Ambulatory Visit: Payer: Self-pay | Admitting: Physician Assistant

## 2023-09-06 ENCOUNTER — Other Ambulatory Visit (HOSPITAL_COMMUNITY): Payer: Self-pay

## 2023-09-06 MED ORDER — PANTOPRAZOLE SODIUM 40 MG PO TBEC
40.0000 mg | DELAYED_RELEASE_TABLET | Freq: Every day | ORAL | 0 refills | Status: DC
  Filled 2023-09-06 (×2): qty 30, 30d supply, fill #0

## 2023-09-08 ENCOUNTER — Other Ambulatory Visit: Payer: Self-pay

## 2023-09-12 ENCOUNTER — Other Ambulatory Visit: Payer: Self-pay

## 2023-09-12 ENCOUNTER — Other Ambulatory Visit: Payer: Self-pay | Admitting: Internal Medicine

## 2023-09-12 ENCOUNTER — Other Ambulatory Visit (HOSPITAL_COMMUNITY): Payer: Self-pay

## 2023-09-12 ENCOUNTER — Telehealth: Payer: Self-pay | Admitting: Internal Medicine

## 2023-09-12 MED ORDER — PREDNISONE 10 MG PO TABS
10.0000 mg | ORAL_TABLET | Freq: Every day | ORAL | 0 refills | Status: DC
  Filled 2023-09-12 (×2): qty 30, 30d supply, fill #0

## 2023-09-12 NOTE — Telephone Encounter (Signed)
 Let pt know 1 have given 1 mth refill on Prednisone . Needs to be seen prior to running out for additional refills. He cancel and no-showed last 2 appointments.

## 2023-09-13 NOTE — Telephone Encounter (Signed)
 Called but no answer. Unable to LVM due to full VM.

## 2023-09-14 NOTE — Telephone Encounter (Signed)
 Called & spoke to the patient. Verified name & DOB. Informed of 1 month prednisone  refill sent to the pharmacy. Informed that a follow-up appointment is needed for additional refills. Follow-up appointment scheduled for 10/18/2023. Patient confirmed appointment. No further assistance needed at this time.

## 2023-09-15 ENCOUNTER — Other Ambulatory Visit: Payer: Self-pay | Admitting: *Deleted

## 2023-09-15 NOTE — Patient Instructions (Signed)
 James [name] - I am sorry I was unable to reach you today for our scheduled appointment. I work with Vicci Barnie NOVAK, MD and am calling to support your healthcare needs. Please contact me at [phone] at your earliest convenience. I look forward to speaking with you soon.   Thank you,  Bulmaro Feagans, RN, BSN, ACM RN Care Manager [name] [phone]

## 2023-09-22 ENCOUNTER — Telehealth: Payer: Self-pay | Admitting: *Deleted

## 2023-09-22 NOTE — Progress Notes (Unsigned)
 Complex Care Management Care Guide Note  09/22/2023 Name: James [name] MRN: [id number] DOB: March 02, 1960  James [name] is a 63 y.o. year old male who is a primary care patient of Vicci Barnie NOVAK, MD and is actively engaged with the care management team. I reached out to Beryl JONETTA Lunger by phone today to assist with re-scheduling  with the RN Case Manager.  Follow up plan: Unsuccessful telephone outreach attempt made.   Harlene Satterfield  [facility]  [facility], [facility] Guide  Direct Dial: [phone]  Fax [phone]

## 2023-09-23 NOTE — Progress Notes (Signed)
 Complex Care Management Care Guide Note  09/23/2023 Name: James [name] MRN: [id number] DOB: 07/02/1960  James [name] is a 63 y.o. year old male who is a primary care patient of Vicci Barnie NOVAK, MD and is actively engaged with the care management team. I reached out to Beryl JONETTA Lunger by phone today to assist with re-scheduling  with the RN Case Manager.  Follow up plan: Unsuccessful telephone outreach attempt made. No further outreach attempts will be made at this time. We have been unable to contact the patient to reschedule for complex care management services.   Harlene Satterfield  [facility]  [facility], [facility] Guide  Direct Dial: [phone]  Fax [phone]

## 2023-09-27 ENCOUNTER — Other Ambulatory Visit: Payer: Self-pay

## 2023-10-03 ENCOUNTER — Other Ambulatory Visit: Payer: Self-pay

## 2023-10-03 ENCOUNTER — Other Ambulatory Visit (HOSPITAL_COMMUNITY): Payer: Self-pay

## 2023-10-05 DIAGNOSIS — F79 Unspecified intellectual disabilities: Secondary | ICD-10-CM | POA: Diagnosis not present

## 2023-10-05 DIAGNOSIS — J9621 Acute and chronic respiratory failure with hypoxia: Secondary | ICD-10-CM | POA: Diagnosis not present

## 2023-10-07 DIAGNOSIS — J9621 Acute and chronic respiratory failure with hypoxia: Secondary | ICD-10-CM | POA: Diagnosis not present

## 2023-10-07 DIAGNOSIS — F79 Unspecified intellectual disabilities: Secondary | ICD-10-CM | POA: Diagnosis not present

## 2023-10-11 ENCOUNTER — Other Ambulatory Visit: Payer: Self-pay

## 2023-10-11 ENCOUNTER — Other Ambulatory Visit (HOSPITAL_COMMUNITY): Payer: Self-pay

## 2023-10-11 ENCOUNTER — Other Ambulatory Visit: Payer: Self-pay | Admitting: Internal Medicine

## 2023-10-11 ENCOUNTER — Other Ambulatory Visit: Payer: Self-pay | Admitting: Physician Assistant

## 2023-10-11 MED ORDER — PREDNISONE 10 MG PO TABS
10.0000 mg | ORAL_TABLET | Freq: Every day | ORAL | 0 refills | Status: DC
  Filled 2023-10-11 (×2): qty 30, 30d supply, fill #0

## 2023-10-11 MED ORDER — PANTOPRAZOLE SODIUM 40 MG PO TBEC
40.0000 mg | DELAYED_RELEASE_TABLET | Freq: Every day | ORAL | 0 refills | Status: DC
  Filled 2023-10-11 (×2): qty 30, 30d supply, fill #0

## 2023-10-11 MED ORDER — FUROSEMIDE 40 MG PO TABS
40.0000 mg | ORAL_TABLET | Freq: Every day | ORAL | 0 refills | Status: DC
  Filled 2023-10-11 (×2): qty 30, 30d supply, fill #0

## 2023-10-17 ENCOUNTER — Other Ambulatory Visit: Payer: Self-pay

## 2023-10-17 ENCOUNTER — Other Ambulatory Visit (HOSPITAL_COMMUNITY): Payer: Self-pay

## 2023-10-17 ENCOUNTER — Other Ambulatory Visit: Payer: Self-pay | Admitting: Dermatology

## 2023-10-17 DIAGNOSIS — B356 Tinea cruris: Secondary | ICD-10-CM

## 2023-10-18 ENCOUNTER — Ambulatory Visit: Attending: Internal Medicine | Admitting: Internal Medicine

## 2023-10-18 ENCOUNTER — Encounter: Payer: Self-pay | Admitting: Internal Medicine

## 2023-10-18 ENCOUNTER — Other Ambulatory Visit (HOSPITAL_COMMUNITY): Payer: Self-pay

## 2023-10-18 ENCOUNTER — Other Ambulatory Visit: Payer: Self-pay

## 2023-10-18 VITALS — BP 121/84 | HR 82 | Temp 97.8°F | Ht 69.0 in | Wt 194.0 lb

## 2023-10-18 DIAGNOSIS — Z7951 Long term (current) use of inhaled steroids: Secondary | ICD-10-CM

## 2023-10-18 DIAGNOSIS — Z9981 Dependence on supplemental oxygen: Secondary | ICD-10-CM | POA: Diagnosis not present

## 2023-10-18 DIAGNOSIS — E1169 Type 2 diabetes mellitus with other specified complication: Secondary | ICD-10-CM

## 2023-10-18 DIAGNOSIS — J9611 Chronic respiratory failure with hypoxia: Secondary | ICD-10-CM | POA: Diagnosis not present

## 2023-10-18 DIAGNOSIS — J449 Chronic obstructive pulmonary disease, unspecified: Secondary | ICD-10-CM

## 2023-10-18 DIAGNOSIS — B356 Tinea cruris: Secondary | ICD-10-CM

## 2023-10-18 DIAGNOSIS — E785 Hyperlipidemia, unspecified: Secondary | ICD-10-CM | POA: Diagnosis not present

## 2023-10-18 DIAGNOSIS — L602 Onychogryphosis: Secondary | ICD-10-CM | POA: Diagnosis not present

## 2023-10-18 DIAGNOSIS — I5032 Chronic diastolic (congestive) heart failure: Secondary | ICD-10-CM

## 2023-10-18 DIAGNOSIS — Z2821 Immunization not carried out because of patient refusal: Secondary | ICD-10-CM

## 2023-10-18 DIAGNOSIS — E1159 Type 2 diabetes mellitus with other circulatory complications: Secondary | ICD-10-CM

## 2023-10-18 DIAGNOSIS — Z7984 Long term (current) use of oral hypoglycemic drugs: Secondary | ICD-10-CM

## 2023-10-18 DIAGNOSIS — E119 Type 2 diabetes mellitus without complications: Secondary | ICD-10-CM

## 2023-10-18 DIAGNOSIS — I110 Hypertensive heart disease with heart failure: Secondary | ICD-10-CM

## 2023-10-18 DIAGNOSIS — F1090 Alcohol use, unspecified, uncomplicated: Secondary | ICD-10-CM

## 2023-10-18 LAB — POCT GLYCOSYLATED HEMOGLOBIN (HGB A1C): HbA1c, POC (controlled diabetic range): 5.9 % (ref 0.0–7.0)

## 2023-10-18 LAB — GLUCOSE, POCT (MANUAL RESULT ENTRY): POC Glucose: 123 mg/dL — AB (ref 70–99)

## 2023-10-18 MED ORDER — FUROSEMIDE 40 MG PO TABS
40.0000 mg | ORAL_TABLET | Freq: Every day | ORAL | 1 refills | Status: AC
  Filled 2023-10-18 – 2023-11-16 (×3): qty 90, 90d supply, fill #0

## 2023-10-18 MED ORDER — PREDNISONE 10 MG PO TABS
10.0000 mg | ORAL_TABLET | Freq: Every day | ORAL | 2 refills | Status: DC
  Filled 2023-10-18: qty 30, 30d supply, fill #0

## 2023-10-18 MED ORDER — ACCU-CHEK SOFTCLIX LANCETS MISC
12 refills | Status: DC
  Filled 2023-10-18: qty 100, 34d supply, fill #0
  Filled 2023-10-18: qty 100, fill #0

## 2023-10-18 MED ORDER — TERBINAFINE HCL 250 MG PO TABS
250.0000 mg | ORAL_TABLET | Freq: Every day | ORAL | 0 refills | Status: DC
  Filled 2023-10-18 (×2): qty 7, 7d supply, fill #0

## 2023-10-18 MED ORDER — ACCU-CHEK GUIDE W/DEVICE KIT
PACK | 0 refills | Status: DC
  Filled 2023-10-18: qty 1, 30d supply, fill #0
  Filled 2023-10-18: qty 1, fill #0

## 2023-10-18 MED ORDER — GLIPIZIDE ER 2.5 MG PO TB24
2.5000 mg | ORAL_TABLET | Freq: Every day | ORAL | 1 refills | Status: AC
  Filled 2023-10-18 (×4): qty 90, 90d supply, fill #0

## 2023-10-18 MED ORDER — PANTOPRAZOLE SODIUM 40 MG PO TBEC
40.0000 mg | DELAYED_RELEASE_TABLET | Freq: Every day | ORAL | 1 refills | Status: AC
  Filled 2023-10-18 – 2023-11-16 (×3): qty 90, 90d supply, fill #0

## 2023-10-18 MED ORDER — NYSTATIN 100000 UNIT/GM EX OINT
1.0000 | TOPICAL_OINTMENT | Freq: Two times a day (BID) | CUTANEOUS | 2 refills | Status: AC
Start: 2023-10-18 — End: ?
  Filled 2023-10-18 (×2): qty 30, 15d supply, fill #0
  Filled 2023-12-19: qty 30, 30d supply, fill #0
  Filled 2023-12-19: qty 30, 15d supply, fill #1

## 2023-10-18 MED ORDER — FOLIC ACID 1 MG PO TABS
1.0000 mg | ORAL_TABLET | Freq: Every day | ORAL | 1 refills | Status: AC
  Filled 2023-10-18 – 2024-01-06 (×2): qty 90, 90d supply, fill #0

## 2023-10-18 MED ORDER — ALBUTEROL SULFATE HFA 108 (90 BASE) MCG/ACT IN AERS
2.0000 | INHALATION_SPRAY | Freq: Four times a day (QID) | RESPIRATORY_TRACT | 12 refills | Status: DC | PRN
  Filled 2023-10-18: qty 54, 75d supply, fill #0

## 2023-10-18 MED ORDER — ACCU-CHEK GUIDE TEST VI STRP
ORAL_STRIP | 12 refills | Status: DC
  Filled 2023-10-18: qty 50, 34d supply, fill #0
  Filled 2023-10-18: qty 100, fill #0
  Filled 2023-10-18: qty 100, 34d supply, fill #0

## 2023-10-18 NOTE — Progress Notes (Signed)
 Patient ID: James [name], male    DOB: [birth year]  MRN: [id number]  CC: Diabetes (DM f/u. Med refills. /Itching around testicles X1 -2 mo/No to all vax.)   Subjective: James [name] is a 63 y.o. male who presents for chronic ds management. His concerns today include:  Patient with history of DM type II, HTN, COPD mixed type, bronchiectasis, lung nodules, chronic hypoxia on home O2 2 L, EtOH use disorder in remission, aortic root dilatation, diastolic CHF, iron  def   Discussed the use of AI scribe software for clinical note transcription with the patient, who gave verbal consent to proceed.  History of Present Illness James [name] is a 63 year old male with diabetes, hypertension, congestive heart failure, and COPD who presents for follow-up of his chronic medical conditions.  DM:  Results for orders placed or performed in visit on 10/18/23  POCT glucose (manual entry)   Collection Time: 10/18/23  9:06 AM  Result Value Ref Range   POC Glucose 123 (A) 70 - 99 mg/dl  POCT glycosylated hemoglobin (Hb A1C)   Collection Time: 10/18/23  9:26 AM  Result Value Ref Range   Hemoglobin A1C     HbA1c POC (<> result, manual entry)     HbA1c, POC (prediabetic range)     HbA1c, POC (controlled diabetic range) 5.9 0.0 - 7.0 %   *Note: Due to a large number of results and/or encounters for the requested time period, some results have not been displayed. A complete set of results can be found in Results Review.  His diabetes management is stable with an A1c of 5.9% and a morning blood sugar of 123 mg/dL. He is taking glipizide  2.5 mg daily, reduced from 5 mg due to previous hypoglycemia episodes. No further issues with low blood sugars are reported, but he does not regularly check his blood sugar levels; no glucometer. Agrees to getting rxn for one. He can recognize symptoms of hypoglycemia and manages them by eating. - Request referral to podiatry to have toenails clipped.  HTN/CHF:  he is on  metoprolol  XL 50 mg daily, losartan  100 mg daily, and furosemide  40 mg daily. No swelling in the legs or chest pain is reported.  Regarding his COPD, he uses a Breztri  inhaler daily and a Ventolin  inhaler up to three times a day. Should be taking 2 puffs twice a day with the [facility] but using only one puff twice a day. He has been on prednisone  10 mg daily for about two years. He uses home oxygen  at night at 2 liters per minute, but less frequently during the day since the weather has cooled. No increased cough or need for nebulizer solution beyond nightly use is reported.  HL: He is also on atorvastatin  for cholesterol management, with the last lipid panel done in February of the previous year. He is due for a recheck of his cholesterol levels.  AUD: Socially, he continues to chew snuff and has reduced his alcohol intake to two 12 oz cans a day.   Jocks itch: He saw the dermatologist Dr. Alm earlier this year and was prescribed nystatin  and triamcinolone  ointment.  He was also given a 1 month supply of Lamisil .  Reports this worked well but condition has reoccurred.  He is requesting refills.  HM: He declined the flu shot but agreed to a pneumonia vaccine.       Patient Active Problem List   Diagnosis Date Noted   Pain in right knee  03/14/2023   COPD with acute exacerbation (HCC) 02/16/2023   COPD exacerbation (HCC) 11/22/2022   Dysphagia 10/01/2022   Alcohol dependence in remission (HCC) 03/02/2022   Aortic root dilation 03/02/2022   Vision changes 12/29/2021   Oropharyngeal dysphagia 12/29/2021   Onychomycosis 12/29/2021   Malnutrition of moderate degree 12/05/2021   Tinea cruris 07/21/2021   Multiple lung nodules on CT 06/11/2021   COPD with chronic bronchitis (HCC) 02/01/2021   GERD (gastroesophageal reflux disease) 02/01/2021   Neuropathy 09/29/2020   Bronchiectasis (HCC) 09/24/2019   Type 2 diabetes mellitus (HCC) 07/24/2019   Other atopic dermatitis 05/01/2019   Acute on  chronic respiratory failure (HCC) 04/23/2019   Intellectual disability 04/23/2019   HTN (hypertension) 05/17/2016   Former tobacco use 04/06/2016   COPD mixed type (HCC) 03/26/2016     Current Outpatient Medications on File Prior to Visit  Medication Sig Dispense Refill   albuterol  (PROVENTIL ) (2.5 MG/3ML) 0.083% nebulizer solution Inhale 3 mLs (2.5 mg total) by nebulization every 4 (four) hours as needed for wheezing or shortness of breath. 270 mL 1   atorvastatin  (LIPITOR) 10 MG tablet Take 1 tablet (10 mg total) by mouth daily. 90 tablet 3   budesonide -glycopyrrolate -formoterol  (BREZTRI  AEROSPHERE) 160-9-4.8 MCG/ACT AERO inhaler Inhale 2 puffs into the lungs in the morning and at bedtime. 10.7 g 11   losartan  (COZAAR ) 100 MG tablet Take 1 tablet (100 mg total) by mouth daily. 90 tablet 2   meloxicam  (MOBIC ) 7.5 MG tablet Take 1 tablet (7.5 mg total) by mouth daily. 30 tablet 0   metoprolol  succinate (TOPROL -XL) 50 MG 24 hr tablet Take 1 tablet (50 mg total) by mouth daily. Take with or immediately following a meal. 90 tablet 2   pregabalin  (LYRICA ) 100 MG capsule Take 1 capsule (100 mg total) by mouth 2 (two) times daily. 60 capsule 5   triamcinolone  (KENALOG ) 0.025 % ointment Apply 1 Application topically 2 (two) times daily. Mix with nystatin . Use for 2 weeks. (Patient not taking: Reported on 10/18/2023) 30 g 2   No current facility-administered medications on file prior to visit.    No Known Allergies  Social History   Socioeconomic History   Marital status: Divorced    Spouse name: Not on file   Number of children: Not on file   Years of education: Not on file   Highest education level: Not on file  Occupational History   Occupation: Curator   Occupation: Painter  Tobacco Use   Smoking status: Former    Current packs/day: 0.00    Average packs/day: 1.5 packs/day for 40.0 years (60.0 ttl pk-yrs)    Types: Cigarettes    Start date: 76    Quit date: 2014    Years since  quitting: 11.7   Smokeless tobacco: Former    Types: Engineer, drilling   Vaping status: Never Used  Substance and Sexual Activity   Alcohol use: Yes    Alcohol/week: 4.0 standard drinks of alcohol    Types: 4 Cans of beer per week    Comment: 4 beers a night previously   Drug use: Not Currently    Types: Marijuana    Comment: daily; quit using crack and methamphetamine about 5 months ago    Sexual activity: Not Currently    Partners: Female  Other Topics Concern   Not on file  Social History Narrative   Lives alone near [place]   Social Drivers of Health   Financial Resource Strain: Low Risk  (  12/25/2020)   Overall Financial Resource Strain (CARDIA)    Difficulty of Paying Living Expenses: Not very hard  Recent Concern: Financial Resource Strain - High Risk (10/29/2020)   Overall Financial Resource Strain (CARDIA)    Difficulty of Paying Living Expenses: Hard  Food Insecurity: No Food Insecurity (02/24/2023)   Hunger Vital Sign    Worried About Running Out of Food in the Last Year: Never true    Ran Out of Food in the Last Year: Never true  Transportation Needs: Unmet Transportation Needs (02/17/2023)   PRAPARE - Transportation    Lack of Transportation (Medical): Yes    Lack of Transportation (Non-Medical): Yes  Physical Activity: Inactive (12/11/2020)   Exercise Vital Sign    Days of Exercise per Week: 0 days    Minutes of Exercise per Session: 0 min  Stress: No Stress Concern Present (12/25/2020)   [name] of Occupational Health - Occupational Stress Questionnaire    Feeling of Stress : Not at all  Social Connections: Moderately Integrated (11/13/2020)   Social Connection and Isolation Panel    Frequency of Communication with Friends and Family: More than three times a week    Frequency of Social Gatherings with Friends and Family: Once a week    Attends Religious Services: 1 to 4 times per year    Active Member of [employer] or Organizations: Yes    Attends  Banker Meetings: 1 to 4 times per year    Marital Status: Divorced  Intimate Partner Violence: Not At Risk (02/17/2023)   Humiliation, Afraid, Rape, and Kick questionnaire    Fear of Current or Ex-Partner: No    Emotionally Abused: No    Physically Abused: No    Sexually Abused: No    Family History  Problem Relation Age of Onset   Heart disease Father    COPD Sister     Past Surgical History:  Procedure Laterality Date   ESOPHAGEAL MANOMETRY N/A 09/15/2022   Procedure: ESOPHAGEAL MANOMETRY (EM);  Surgeon: Abran Norleen SAILOR, MD;  Location: [facility] ENDOSCOPY;  Service: Gastroenterology;  Laterality: N/A;   LEFT HEART CATH AND CORONARY ANGIOGRAPHY N/A 12/07/2021   Procedure: LEFT HEART CATH AND CORONARY ANGIOGRAPHY;  Surgeon: Court Dorn PARAS, MD;  Location: [facility] INVASIVE CV LAB;  Service: Cardiovascular;  Laterality: N/A;   LUNG REMOVAL, PARTIAL     VIDEO ASSISTED THORACOSCOPY (VATS)/THOROCOTOMY Left 06/11/2013   Procedure: VIDEO ASSISTED THORACOSCOPY (VATS)/THOROCOTOMY;  Surgeon: Maude Fleeta Ochoa, MD;  Location: [facility] OR;  Service: Thoracic;  Laterality: Left;   VIDEO BRONCHOSCOPY N/A 06/11/2013   Procedure: VIDEO BRONCHOSCOPY;  Surgeon: Maude Fleeta Ochoa, MD;  Location: [facility] OR;  Service: Thoracic;  Laterality: N/A;   VIDEO BRONCHOSCOPY N/A 08/10/2018   Procedure: VIDEO BRONCHOSCOPY WITH FLUORO;  Surgeon: Claudene Toribio BROCKS, MD;  Location: [facility] ENDOSCOPY;  Service: Endoscopy;  Laterality: N/A;    ROS: Review of Systems Negative except as stated above  PHYSICAL EXAM: BP 121/84 (BP Location: Left Arm, Patient Position: Sitting, Cuff Size: Normal)   Pulse 82   Temp 97.8 F (36.6 C) (Oral)   Ht 5' 9 (1.753 m)   Wt 194 lb (88 kg)   SpO2 94%   BMI 28.65 kg/m   Physical Exam Patient has his portable oxygen  with him which he was not using when I initially entered the room.  He subsequently applied it at the end of the visit  General appearance - older caucasian male in NAD Mental  status - pt  appears mildly tremulous Neck - supple, no significant adenopathy Chest -breath sounds moderately decreased bilaterally with scattered wheezes. Heart -regular rate and rhythm. Extremities -no lower extremity edema. GU: CMA Clarisa present: Slightly erythematous waxy appearance to the skin on the upper inner thigh and scrotum.    Latest Ref Rng & Units 08/12/2023    3:49 PM 05/30/2023    4:33 AM 05/30/2023    1:01 AM  CMP  Glucose 70 - 99 mg/dL 860     BUN 8 - 23 mg/dL 14     Creatinine 9.38 - 1.24 mg/dL 8.88     Sodium 864 - 854 mmol/L 140  134  135   Potassium 3.5 - 5.1 mmol/L 4.5  5.4  5.4   Chloride 98 - 111 mmol/L 102     CO2 22 - 32 mmol/L 27     Calcium  8.9 - 10.3 mg/dL 9.7     Total Protein 6.5 - 8.1 g/dL 6.8     Total Bilirubin 0.0 - 1.2 mg/dL 0.5     Alkaline Phos 38 - 126 U/L 44     AST 15 - 41 U/L 29     ALT 0 - 44 U/L 29      Lipid Panel     Component Value Date/Time   CHOL 158 03/02/2022 0912   TRIG 140 03/02/2022 0912   HDL 52 03/02/2022 0912   CHOLHDL 3.0 03/02/2022 0912   LDLCALC 82 03/02/2022 0912    CBC    Component Value Date/Time   WBC 10.8 (H) 08/12/2023 1549   RBC 4.41 08/12/2023 1549   HGB 13.7 08/12/2023 1549   HGB 13.1 12/28/2022 1011   HCT 43.1 08/12/2023 1549   HCT 41.5 12/28/2022 1011   PLT 279 08/12/2023 1549   PLT 271 12/28/2022 1011   MCV 97.7 08/12/2023 1549   MCV 92 12/28/2022 1011   MCH 31.1 08/12/2023 1549   MCHC 31.8 08/12/2023 1549   RDW 12.9 08/12/2023 1549   RDW 17.4 (H) 12/28/2022 1011   LYMPHSABS 1.7 08/12/2023 1549   LYMPHSABS 1.2 06/29/2022 1204   MONOABS 0.8 08/12/2023 1549   EOSABS 0.0 08/12/2023 1549   EOSABS 0.2 06/29/2022 1204   BASOSABS 0.1 08/12/2023 1549   BASOSABS 0.0 06/29/2022 1204    ASSESSMENT AND PLAN: 1. Type 2 diabetes mellitus with other specified complication, without long-term current use of insulin  (HCC) (Primary) At goal.  Continue glipizide  2.5 mg daily.  Prescription sent for  diabetic testing supplies.  Advised to check blood sugars before breakfast at least 4 times a week with goal being 90-130.  Went over signs and symptoms of hypoglycemia and how to treat. - POCT glucose (manual entry) - POCT glycosylated hemoglobin (Hb A1C) - Blood Glucose Monitoring Suppl (ACCU-CHEK GUIDE) w/Device KIT; Use to check blood sugar once daily.  Dispense: 1 kit; Refill: 0 - Accu-Chek Softclix Lancets lancets; Use to check blood sugar once daily.  Dispense: 100 each; Refill: 12 - glucose blood (ACCU-CHEK GUIDE TEST) test strip; Use to check blood sugar once daily.  Dispense: 100 each; Refill: 12 - Microalbumin / creatinine urine ratio  2. Diabetes mellitus treated with oral medication (HCC) See #1 above - glipiZIDE  (GLUCOTROL  XL) 2.5 MG 24 hr tablet; Take 1 tablet (2.5 mg total) by mouth daily with breakfast.  Dispense: 90 tablet; Refill: 1  3. Steroid-dependent COPD (HCC) Advised patient that he should be taking 2 puffs of the Breztri  inhaler twice a day.  By doing so,  it should decrease the frequency of which he has to use the Ventolin  inhaler.  Need to get him back in with [place] [facility].  I have resubmitted the referral and also gave him the phone number to call and schedule a follow-up appointment.  He has been dependent on prednisone  10 mg daily from previous PCP Dr. Brien and does not wish to give a trial of weaning. - Ambulatory referral to Pulmonology - predniSONE  (DELTASONE ) 10 MG tablet; Take 1 tablet (10 mg total) by mouth daily with breakfast.  Dispense: 30 tablet; Refill: 2  4. Chronic respiratory failure with hypoxia, on home oxygen  therapy (HCC) Continue O2 2 L.  Patient indicates that he sleeps with it on at nights but has not had to use it much during the day that the temperatures have cooled down - albuterol  (VENTOLIN  HFA) 108 (90 Base) MCG/ACT inhaler; Inhale 2 puffs into the lungs every 6 (six) hours as needed for wheezing or shortness of breath.  Dispense: 54  g; Refill: 12  5. Hypertension associated with type 2 diabetes mellitus (HCC) At goal.  Continue Toprol  50 mg daily and Cozaar  100 mg daily.  6. Chronic diastolic congestive heart failure (HCC) Compensated.  Continue Toprol , Cozaar , furosemide  - furosemide  (LASIX ) 40 MG tablet; Take 1 tablet (40 mg total) by mouth daily.  Dispense: 90 tablet; Refill: 1  7. Hyperlipidemia associated with type 2 diabetes mellitus (HCC) Continue atorvastatin  - Lipid panel  8. Tinea cruris - nystatin  ointment (MYCOSTATIN ); Apply 1 Application topically 2 (two) times daily. Mix with triamcinolone . Use for 2 weeks.  Dispense: 30 g; Refill: 2 - terbinafine  (LAMISIL ) 250 MG tablet; Take 1 tablet (250 mg total) by mouth daily.  Dispense: 7 tablet; Refill: 0  9. Overgrown toenails - Ambulatory referral to Podiatry  10. Alcohol use disorder Commended him on cutting back. - folic acid  (FOLVITE ) 1 MG tablet; Take 1 tablet (1 mg total) by mouth daily.  Dispense: 90 tablet; Refill: 1  11. Influenza vaccination declined Recommended.  Patient declined.  12. Pneumococcal vaccination declined Recommended.  Patient declined.    Patient was given the opportunity to ask questions.  Patient verbalized understanding of the plan and was able to repeat key elements of the plan.   This documentation was completed using Paediatric nurse.  Any transcriptional errors are unintentional.  Orders Placed This Encounter  Procedures   Lipid panel   Microalbumin / creatinine urine ratio   Ambulatory referral to Pulmonology   Ambulatory referral to Podiatry   POCT glucose (manual entry)   POCT glycosylated hemoglobin (Hb A1C)     Requested Prescriptions   Signed Prescriptions Disp Refills   pantoprazole  (PROTONIX ) 40 MG tablet 90 tablet 1    Sig: Take 1 tablet (40 mg total) by mouth daily.   glipiZIDE  (GLUCOTROL  XL) 2.5 MG 24 hr tablet 90 tablet 1    Sig: Take 1 tablet (2.5 mg total) by mouth daily  with breakfast.   furosemide  (LASIX ) 40 MG tablet 90 tablet 1    Sig: Take 1 tablet (40 mg total) by mouth daily.   folic acid  (FOLVITE ) 1 MG tablet 90 tablet 1    Sig: Take 1 tablet (1 mg total) by mouth daily.   albuterol  (VENTOLIN  HFA) 108 (90 Base) MCG/ACT inhaler 54 g 12    Sig: Inhale 2 puffs into the lungs every 6 (six) hours as needed for wheezing or shortness of breath.   Blood Glucose Monitoring Suppl (ACCU-CHEK GUIDE) w/Device  KIT 1 kit 0    Sig: Use to check blood sugar once daily.   Accu-Chek Softclix Lancets lancets 100 each 12    Sig: Use to check blood sugar once daily.   glucose blood (ACCU-CHEK GUIDE TEST) test strip 100 each 12    Sig: Use to check blood sugar once daily.   predniSONE  (DELTASONE ) 10 MG tablet 30 tablet 2    Sig: Take 1 tablet (10 mg total) by mouth daily with breakfast.   nystatin  ointment (MYCOSTATIN ) 30 g 2    Sig: Apply 1 Application topically 2 (two) times daily. Mix with triamcinolone . Use for 2 weeks.   terbinafine  (LAMISIL ) 250 MG tablet 7 tablet 0    Sig: Take 1 tablet (250 mg total) by mouth daily.    Return in about 4 months (around 02/18/2024).  Barnie Louder, MD, FACP

## 2023-10-18 NOTE — Patient Instructions (Addendum)
 Please call [facility] to schedule follow up appointment [address] [place] [address] Ph# [phone]   VISIT SUMMARY: Today, you came in for a follow-up visit to manage your chronic conditions, including diabetes, hypertension, congestive heart failure, and COPD. We reviewed your current medications and made some adjustments to better control your symptoms and improve your overall health.  YOUR PLAN: -CHRONIC OBSTRUCTIVE PULMONARY DISEASE WITH CHRONIC HYPOXIA: COPD is a chronic lung condition that makes it hard to breathe. You will increase your Breztri  inhaler to two puffs twice a day and continue using your albuterol  inhaler as needed. We will also resubmit a referral to [facility] for a follow-up with Dr. Lauraine Knoll. Your prescriptions for Breztri  and prednisone  have been refilled.  -CONGESTIVE HEART FAILURE AND HYPERTENSION: Congestive heart failure and hypertension are conditions where the heart doesn't pump blood as well as it should, and the blood pressure is consistently too high. You should continue taking metoprolol  XL, losartan , and furosemide  as prescribed.  -TYPE 2 DIABETES MELLITUS: Type 2 diabetes is a condition where your body doesn't use insulin  properly, leading to high blood sugar levels. Your diabetes is well-controlled with an A1c of 5.9%. We have prescribed a glucometer and strips for you to check your blood sugar about four times a week or when you feel symptoms of low blood sugar.  -AORTIC ATHEROSCLEROSIS: Aortic atherosclerosis is the buildup of fats and cholesterol in the artery walls, which can restrict blood flow. We have ordered a lipid panel to check your cholesterol levels today.  -TINEA CRURIS: Tinea cruris is a fungal infection that causes a rash and itching around the groin area. We have sent a refill for your ointment to the pharmacy downstairs.  -TOBACCO USE: You continue to use snuff but have expressed a desire to quit. We  encourage you to stop using snuff for your overall health.  -ALCOHOL USE: You have reduced your alcohol intake to two cans per day, which is a positive step for your health.  -GENERAL HEALTH MAINTENANCE: You declined the flu vaccine but agreed to the pneumonia vaccine. We will perform a urine protein test today as part of your diabetes management.  INSTRUCTIONS: Please follow up with [facility] for your COPD management. Continue taking your medications as prescribed and check your blood sugar levels as instructed. We will contact you with the results of your lipid panel and urine protein test.                      Contains text generated by [employer]bridge.                                 Contains text generated by [employer]bridge.

## 2023-10-19 ENCOUNTER — Ambulatory Visit: Payer: Self-pay | Admitting: Internal Medicine

## 2023-10-20 LAB — MICROALBUMIN / CREATININE URINE RATIO
Creatinine, Urine: 171.5 mg/dL
Microalb/Creat Ratio: 6 mg/g{creat} (ref 0–29)
Microalbumin, Urine: 10 ug/mL

## 2023-10-20 LAB — LIPID PANEL
Chol/HDL Ratio: 2.3 ratio (ref 0.0–5.0)
Cholesterol, Total: 175 mg/dL (ref 100–199)
HDL: 76 mg/dL (ref >39)
LDL Chol Calc (NIH): 71 mg/dL (ref 0–99)
Triglycerides: 168 mg/dL — ABNORMAL HIGH (ref 0–149)
VLDL Cholesterol Cal: 28 mg/dL (ref 5–40)

## 2023-11-05 DIAGNOSIS — J9621 Acute and chronic respiratory failure with hypoxia: Secondary | ICD-10-CM | POA: Diagnosis not present

## 2023-11-05 DIAGNOSIS — F79 Unspecified intellectual disabilities: Secondary | ICD-10-CM | POA: Diagnosis not present

## 2023-11-07 ENCOUNTER — Encounter: Payer: Self-pay | Admitting: Radiology

## 2023-11-07 DIAGNOSIS — F79 Unspecified intellectual disabilities: Secondary | ICD-10-CM | POA: Diagnosis not present

## 2023-11-07 DIAGNOSIS — J9601 Acute respiratory failure with hypoxia: Secondary | ICD-10-CM | POA: Diagnosis not present

## 2023-11-07 DIAGNOSIS — J9621 Acute and chronic respiratory failure with hypoxia: Secondary | ICD-10-CM | POA: Diagnosis not present

## 2023-11-10 ENCOUNTER — Other Ambulatory Visit: Payer: Self-pay

## 2023-11-12 ENCOUNTER — Other Ambulatory Visit: Payer: Self-pay

## 2023-11-12 ENCOUNTER — Encounter (HOSPITAL_COMMUNITY): Payer: Self-pay | Admitting: Emergency Medicine

## 2023-11-12 ENCOUNTER — Inpatient Hospital Stay (HOSPITAL_COMMUNITY)
Admission: EM | Admit: 2023-11-12 | Discharge: 2023-11-15 | DRG: 193 | Disposition: A | Discharge status: Home / Self Care | Attending: Internal Medicine | Admitting: Internal Medicine

## 2023-11-12 ENCOUNTER — Emergency Department (HOSPITAL_COMMUNITY)

## 2023-11-12 DIAGNOSIS — I5032 Chronic diastolic (congestive) heart failure: Secondary | ICD-10-CM | POA: Diagnosis present

## 2023-11-12 DIAGNOSIS — R0603 Acute respiratory distress: Principal | ICD-10-CM

## 2023-11-12 DIAGNOSIS — J189 Pneumonia, unspecified organism: Secondary | ICD-10-CM | POA: Diagnosis not present

## 2023-11-12 DIAGNOSIS — I252 Old myocardial infarction: Secondary | ICD-10-CM

## 2023-11-12 DIAGNOSIS — Z8249 Family history of ischemic heart disease and other diseases of the circulatory system: Secondary | ICD-10-CM

## 2023-11-12 DIAGNOSIS — Z791 Long term (current) use of non-steroidal anti-inflammatories (NSAID): Secondary | ICD-10-CM

## 2023-11-12 DIAGNOSIS — Z79899 Other long term (current) drug therapy: Secondary | ICD-10-CM

## 2023-11-12 DIAGNOSIS — Z7951 Long term (current) use of inhaled steroids: Secondary | ICD-10-CM

## 2023-11-12 DIAGNOSIS — Z825 Family history of asthma and other chronic lower respiratory diseases: Secondary | ICD-10-CM

## 2023-11-12 DIAGNOSIS — Z1152 Encounter for screening for COVID-19: Secondary | ICD-10-CM | POA: Diagnosis not present

## 2023-11-12 DIAGNOSIS — Z7952 Long term (current) use of systemic steroids: Secondary | ICD-10-CM | POA: Diagnosis not present

## 2023-11-12 DIAGNOSIS — Z7984 Long term (current) use of oral hypoglycemic drugs: Secondary | ICD-10-CM | POA: Diagnosis not present

## 2023-11-12 DIAGNOSIS — F10939 Alcohol use, unspecified with withdrawal, unspecified: Secondary | ICD-10-CM | POA: Diagnosis not present

## 2023-11-12 DIAGNOSIS — Z8616 Personal history of COVID-19: Secondary | ICD-10-CM

## 2023-11-12 DIAGNOSIS — Z8701 Personal history of pneumonia (recurrent): Secondary | ICD-10-CM | POA: Diagnosis not present

## 2023-11-12 DIAGNOSIS — R0602 Shortness of breath: Secondary | ICD-10-CM | POA: Diagnosis not present

## 2023-11-12 DIAGNOSIS — E1142 Type 2 diabetes mellitus with diabetic polyneuropathy: Secondary | ICD-10-CM | POA: Diagnosis not present

## 2023-11-12 DIAGNOSIS — R0600 Dyspnea, unspecified: Secondary | ICD-10-CM | POA: Diagnosis not present

## 2023-11-12 DIAGNOSIS — Z87891 Personal history of nicotine dependence: Secondary | ICD-10-CM | POA: Diagnosis not present

## 2023-11-12 DIAGNOSIS — K219 Gastro-esophageal reflux disease without esophagitis: Secondary | ICD-10-CM | POA: Diagnosis present

## 2023-11-12 DIAGNOSIS — J441 Chronic obstructive pulmonary disease with (acute) exacerbation: Secondary | ICD-10-CM | POA: Diagnosis present

## 2023-11-12 DIAGNOSIS — I110 Hypertensive heart disease with heart failure: Secondary | ICD-10-CM | POA: Diagnosis present

## 2023-11-12 DIAGNOSIS — I491 Atrial premature depolarization: Secondary | ICD-10-CM | POA: Diagnosis not present

## 2023-11-12 DIAGNOSIS — Z902 Acquired absence of lung [part of]: Secondary | ICD-10-CM

## 2023-11-12 DIAGNOSIS — J440 Chronic obstructive pulmonary disease with acute lower respiratory infection: Secondary | ICD-10-CM | POA: Diagnosis not present

## 2023-11-12 DIAGNOSIS — J9611 Chronic respiratory failure with hypoxia: Secondary | ICD-10-CM

## 2023-11-12 DIAGNOSIS — J90 Pleural effusion, not elsewhere classified: Secondary | ICD-10-CM | POA: Diagnosis not present

## 2023-11-12 DIAGNOSIS — I499 Cardiac arrhythmia, unspecified: Secondary | ICD-10-CM | POA: Diagnosis not present

## 2023-11-12 DIAGNOSIS — J929 Pleural plaque without asbestos: Secondary | ICD-10-CM | POA: Diagnosis not present

## 2023-11-12 DIAGNOSIS — J449 Chronic obstructive pulmonary disease, unspecified: Secondary | ICD-10-CM

## 2023-11-12 DIAGNOSIS — R000 Tachycardia, unspecified: Secondary | ICD-10-CM | POA: Diagnosis not present

## 2023-11-12 DIAGNOSIS — J9600 Acute respiratory failure, unspecified whether with hypoxia or hypercapnia: Secondary | ICD-10-CM | POA: Diagnosis not present

## 2023-11-12 LAB — I-STAT CHEM 8, ED
BUN: 26 mg/dL — ABNORMAL HIGH (ref 8–23)
Calcium, Ion: 1.11 mmol/L — ABNORMAL LOW (ref 1.15–1.40)
Chloride: 101 mmol/L (ref 98–111)
Creatinine, Ser: 1.1 mg/dL (ref 0.61–1.24)
Glucose, Bld: 137 mg/dL — ABNORMAL HIGH (ref 70–99)
HCT: 44 % (ref 39.0–52.0)
Hemoglobin: 15 g/dL (ref 13.0–17.0)
Potassium: 4 mmol/L (ref 3.5–5.1)
Sodium: 139 mmol/L (ref 135–145)
TCO2: 29 mmol/L (ref 22–32)

## 2023-11-12 LAB — I-STAT VENOUS BLOOD GAS, ED
Acid-Base Excess: 5 mmol/L — ABNORMAL HIGH (ref 0.0–2.0)
Acid-Base Excess: 4 mmol/L — ABNORMAL HIGH (ref 0.0–2.0)
Bicarbonate: 29.7 mmol/L — ABNORMAL HIGH (ref 20.0–28.0)
Bicarbonate: 31.1 mmol/L — ABNORMAL HIGH (ref 20.0–28.0)
Calcium, Ion: 1.1 mmol/L — ABNORMAL LOW (ref 1.15–1.40)
Calcium, Ion: 1.18 mmol/L (ref 1.15–1.40)
HCT: 42 % (ref 39.0–52.0)
HCT: 42 % (ref 39.0–52.0)
Hemoglobin: 14.3 g/dL (ref 13.0–17.0)
Hemoglobin: 14.3 g/dL (ref 13.0–17.0)
O2 Saturation: 79 %
O2 Saturation: 80 %
Potassium: 3.9 mmol/L (ref 3.5–5.1)
Potassium: 4.8 mmol/L (ref 3.5–5.1)
Sodium: 139 mmol/L (ref 135–145)
Sodium: 138 mmol/L (ref 135–145)
TCO2: 31 mmol/L (ref 22–32)
TCO2: 33 mmol/L — ABNORMAL HIGH (ref 22–32)
pCO2, Ven: 42.9 mmHg — ABNORMAL LOW (ref 44–60)
pCO2, Ven: 57.8 mmHg (ref 44–60)
pH, Ven: 7.449 — ABNORMAL HIGH (ref 7.25–7.43)
pH, Ven: 7.339 (ref 7.25–7.43)
pO2, Ven: 42 mmHg (ref 32–45)
pO2, Ven: 48 mmHg — ABNORMAL HIGH (ref 32–45)

## 2023-11-12 LAB — CBC WITH DIFFERENTIAL/PLATELET
Abs Immature Granulocytes: 0.07 K/uL (ref 0.00–0.07)
Basophils Absolute: 0.1 K/uL (ref 0.0–0.1)
Basophils Relative: 0 %
Eosinophils Absolute: 0.2 K/uL (ref 0.0–0.5)
Eosinophils Relative: 1 %
HCT: 42.7 % (ref 39.0–52.0)
Hemoglobin: 13.7 g/dL (ref 13.0–17.0)
Immature Granulocytes: 0 %
Lymphocytes Relative: 14 %
Lymphs Abs: 2.5 K/uL (ref 0.7–4.0)
MCH: 31.2 pg (ref 26.0–34.0)
MCHC: 32.1 g/dL (ref 30.0–36.0)
MCV: 97.3 fL (ref 80.0–100.0)
Monocytes Absolute: 1.9 K/uL — ABNORMAL HIGH (ref 0.1–1.0)
Monocytes Relative: 11 %
Neutro Abs: 12.6 K/uL — ABNORMAL HIGH (ref 1.7–7.7)
Neutrophils Relative %: 74 %
Platelets: 253 K/uL (ref 150–400)
RBC: 4.39 MIL/uL (ref 4.22–5.81)
RDW: 12.9 % (ref 11.5–15.5)
WBC: 17.2 K/uL — ABNORMAL HIGH (ref 4.0–10.5)
nRBC: 0 % (ref 0.0–0.2)

## 2023-11-12 LAB — COMPREHENSIVE METABOLIC PANEL WITH GFR
ALT: 20 U/L (ref 0–44)
AST: 23 U/L (ref 15–41)
Albumin: 3.5 g/dL (ref 3.5–5.0)
Alkaline Phosphatase: 38 U/L (ref 38–126)
Anion gap: 13 (ref 5–15)
BUN: 22 mg/dL (ref 8–23)
CO2: 28 mmol/L (ref 22–32)
Calcium: 9.1 mg/dL (ref 8.9–10.3)
Chloride: 97 mmol/L — ABNORMAL LOW (ref 98–111)
Creatinine, Ser: 1.11 mg/dL (ref 0.61–1.24)
GFR, Estimated: >60 mL/min (ref >60)
Glucose, Bld: 140 mg/dL — ABNORMAL HIGH (ref 70–99)
Potassium: 4 mmol/L (ref 3.5–5.1)
Sodium: 138 mmol/L (ref 135–145)
Total Bilirubin: 0.7 mg/dL (ref 0.0–1.2)
Total Protein: 5.9 g/dL — ABNORMAL LOW (ref 6.5–8.1)

## 2023-11-12 LAB — GLUCOSE, CAPILLARY
Glucose-Capillary: 248 mg/dL — ABNORMAL HIGH (ref 70–99)
Glucose-Capillary: 185 mg/dL — ABNORMAL HIGH (ref 70–99)

## 2023-11-12 LAB — MRSA NEXT GEN BY PCR, NASAL: MRSA by PCR Next Gen: NOT DETECTED

## 2023-11-12 LAB — RESP PANEL BY RT-PCR (RSV, FLU A&B, COVID)  RVPGX2
Influenza A by PCR: NEGATIVE
Influenza B by PCR: NEGATIVE
Resp Syncytial Virus by PCR: NEGATIVE
SARS Coronavirus 2 by RT PCR: NEGATIVE

## 2023-11-12 LAB — HEMOGLOBIN A1C
Hgb A1c MFr Bld: 6 % — ABNORMAL HIGH (ref 4.8–5.6)
Mean Plasma Glucose: 125.5 mg/dL

## 2023-11-12 LAB — I-STAT CG4 LACTIC ACID, ED
Lactic Acid, Venous: 1.6 mmol/L (ref 0.5–1.9)
Lactic Acid, Venous: 0.9 mmol/L (ref 0.5–1.9)

## 2023-11-12 LAB — TROPONIN I (HIGH SENSITIVITY)
Troponin I (High Sensitivity): 17 ng/L (ref <18)
Troponin I (High Sensitivity): 17 ng/L (ref <18)

## 2023-11-12 LAB — BRAIN NATRIURETIC PEPTIDE: B Natriuretic Peptide: 86.6 pg/mL (ref 0.0–100.0)

## 2023-11-12 LAB — HIV ANTIBODY (ROUTINE TESTING W REFLEX): HIV Screen 4th Generation wRfx: NONREACTIVE

## 2023-11-12 MED ORDER — INSULIN ASPART 100 UNIT/ML IJ SOLN
0.0000 [IU] | Freq: Three times a day (TID) | INTRAMUSCULAR | Status: DC
  Administered 2023-11-12: 5 [IU] via SUBCUTANEOUS
  Administered 2023-11-13: 3 [IU] via SUBCUTANEOUS
  Administered 2023-11-13: 2 [IU] via SUBCUTANEOUS
  Administered 2023-11-13: 3 [IU] via SUBCUTANEOUS
  Administered 2023-11-14: 5 [IU] via SUBCUTANEOUS
  Administered 2023-11-14 – 2023-11-15 (×3): 3 [IU] via SUBCUTANEOUS
  Filled 2023-11-12: qty 5
  Filled 2023-11-12: qty 1
  Filled 2023-11-12: qty 2
  Filled 2023-11-12: qty 3
  Filled 2023-11-12: qty 5
  Filled 2023-11-12: qty 2
  Filled 2023-11-12 (×3): qty 3

## 2023-11-12 MED ORDER — MONTELUKAST SODIUM 10 MG PO TABS
10.0000 mg | ORAL_TABLET | Freq: Every day | ORAL | Status: DC
  Administered 2023-11-12 – 2023-11-14 (×3): 10 mg via ORAL
  Filled 2023-11-12 (×3): qty 1

## 2023-11-12 MED ORDER — SODIUM CHLORIDE 0.9 % IV SOLN
500.0000 mg | Freq: Once | INTRAVENOUS | Status: AC
  Administered 2023-11-12: 500 mg via INTRAVENOUS
  Filled 2023-11-12: qty 5

## 2023-11-12 MED ORDER — METOPROLOL SUCCINATE ER 50 MG PO TB24
50.0000 mg | ORAL_TABLET | Freq: Every day | ORAL | Status: DC
  Administered 2023-11-12 – 2023-11-15 (×4): 50 mg via ORAL
  Filled 2023-11-12 (×3): qty 1
  Filled 2023-11-12: qty 2

## 2023-11-12 MED ORDER — PANTOPRAZOLE SODIUM 40 MG PO TBEC
40.0000 mg | DELAYED_RELEASE_TABLET | Freq: Every day | ORAL | Status: DC
  Administered 2023-11-13 – 2023-11-15 (×3): 40 mg via ORAL
  Filled 2023-11-12 (×3): qty 1

## 2023-11-12 MED ORDER — GLIPIZIDE ER 2.5 MG PO TB24
2.5000 mg | ORAL_TABLET | Freq: Every day | ORAL | Status: DC
  Administered 2023-11-13 – 2023-11-15 (×3): 2.5 mg via ORAL
  Filled 2023-11-12 (×3): qty 1

## 2023-11-12 MED ORDER — AZITHROMYCIN 500 MG PO TABS
500.0000 mg | ORAL_TABLET | Freq: Every day | ORAL | Status: AC
  Administered 2023-11-13 – 2023-11-14 (×2): 500 mg via ORAL
  Filled 2023-11-12 (×2): qty 1

## 2023-11-12 MED ORDER — ENOXAPARIN SODIUM 40 MG/0.4ML IJ SOSY
40.0000 mg | PREFILLED_SYRINGE | INTRAMUSCULAR | Status: DC
  Administered 2023-11-12 – 2023-11-14 (×3): 40 mg via SUBCUTANEOUS
  Filled 2023-11-12 (×3): qty 0.4

## 2023-11-12 MED ORDER — SODIUM CHLORIDE 0.9 % IV SOLN
1.0000 g | INTRAVENOUS | Status: DC
  Administered 2023-11-13 – 2023-11-14 (×2): 1 g via INTRAVENOUS
  Filled 2023-11-12 (×2): qty 10

## 2023-11-12 MED ORDER — IPRATROPIUM-ALBUTEROL 0.5-2.5 (3) MG/3ML IN SOLN
3.0000 mL | Freq: Four times a day (QID) | RESPIRATORY_TRACT | Status: DC | PRN
Start: 2023-11-12 — End: 2023-11-15

## 2023-11-12 MED ORDER — BUDESON-GLYCOPYRROL-FORMOTEROL 160-9-4.8 MCG/ACT IN AERO
2.0000 | INHALATION_SPRAY | Freq: Two times a day (BID) | RESPIRATORY_TRACT | Status: DC
  Administered 2023-11-12 – 2023-11-14 (×5): 2 via RESPIRATORY_TRACT
  Filled 2023-11-12: qty 5.9

## 2023-11-12 MED ORDER — PREDNISONE 20 MG PO TABS
40.0000 mg | ORAL_TABLET | Freq: Every day | ORAL | Status: DC
  Administered 2023-11-12: 40 mg via ORAL
  Filled 2023-11-12 (×2): qty 2

## 2023-11-12 MED ORDER — FUROSEMIDE 40 MG PO TABS
40.0000 mg | ORAL_TABLET | Freq: Every day | ORAL | Status: DC
  Administered 2023-11-12 – 2023-11-15 (×4): 40 mg via ORAL
  Filled 2023-11-12: qty 1
  Filled 2023-11-12: qty 2
  Filled 2023-11-12 (×2): qty 1

## 2023-11-12 MED ORDER — PREGABALIN 50 MG PO CAPS
100.0000 mg | ORAL_CAPSULE | Freq: Two times a day (BID) | ORAL | Status: DC
  Administered 2023-11-12 – 2023-11-15 (×6): 100 mg via ORAL
  Filled 2023-11-12 (×6): qty 2

## 2023-11-12 MED ORDER — SODIUM CHLORIDE 0.9 % IV SOLN
2.0000 g | Freq: Once | INTRAVENOUS | Status: AC
  Administered 2023-11-12: 2 g via INTRAVENOUS
  Filled 2023-11-12: qty 20

## 2023-11-12 MED ORDER — ATORVASTATIN CALCIUM 10 MG PO TABS
10.0000 mg | ORAL_TABLET | Freq: Every day | ORAL | Status: DC
  Administered 2023-11-13 – 2023-11-15 (×3): 10 mg via ORAL
  Filled 2023-11-12 (×3): qty 1

## 2023-11-12 MED ORDER — ORAL CARE MOUTH RINSE: 15.0000 mL | OROMUCOSAL | Status: DC | PRN

## 2023-11-12 MED ORDER — ACETAMINOPHEN 500 MG PO TABS
1000.0000 mg | ORAL_TABLET | Freq: Once | ORAL | Status: AC
  Administered 2023-11-12: 1000 mg via ORAL
  Filled 2023-11-12: qty 2

## 2023-11-12 MED ORDER — LOSARTAN POTASSIUM 25 MG PO TABS
100.0000 mg | ORAL_TABLET | Freq: Every day | ORAL | Status: DC
  Administered 2023-11-13 – 2023-11-15 (×3): 100 mg via ORAL
  Filled 2023-11-12 (×3): qty 4

## 2023-11-12 NOTE — ED Triage Notes (Addendum)
 Pt BIB [facility] for resp distress.  Pt arrived on CPAP.  SOB since 4am, tripodding with very diminished breath sounds on scene.  Breathing much improved on arrival but still SOB. Hx of intubation. PT wears 2L at baseline  0.3 Epi, 125 Solu medrol ,, 2 G Mag, 4mg  zofran   112/62 HR 116 76% RA, 97% CPAP

## 2023-11-12 NOTE — ED Provider Notes (Signed)
 [place] EMERGENCY DEPARTMENT AT [facility] Provider Note   CSN: [id number] Arrival date & time: 11/12/23  9189     Patient presents with: Respiratory Distress   James [name] is a 63 y.o. male.   63 year old male with prior medical history as detailed below presents for evaluation.  Patient reports onset of difficulty breathing since this morning around 4 AM.  He called EMS.  EMS reports the patient was tripoding with near complete loss of breath sounds bilaterally.  Patient was given epi x 1, 125 mg of Solu-Medrol , 2 g of mag, 4 mg of Zofran  and started on CPAP.  On arrival patient placed on BiPAP.  He reports that he feels much improved with EMS treatment.  He denies recent fever or illness.  He reports that he uses approximately 2 L of nasal cannula O2 at night when sleeping.  He does not use oxygen  in the daytime.  The history is provided by the patient and medical records.       Prior to Admission medications   Medication Sig Start Date End Date Taking? Authorizing Provider  Accu-Chek Softclix Lancets lancets Use to check blood sugar once daily. 10/18/23   Vicci Barnie NOVAK, MD  albuterol  (PROVENTIL ) (2.5 MG/3ML) 0.083% nebulizer solution Inhale 3 mLs (2.5 mg total) by nebulization every 4 (four) hours as needed for wheezing or shortness of breath. 03/03/23   McClung, Angela M, PA-C  albuterol  (VENTOLIN  HFA) 108 (90 Base) MCG/ACT inhaler Inhale 2 puffs into the lungs every 6 (six) hours as needed for wheezing or shortness of breath. 10/18/23   Vicci Barnie NOVAK, MD  atorvastatin  (LIPITOR) 10 MG tablet Take 1 tablet (10 mg total) by mouth daily. 04/28/23   Vicci Barnie NOVAK, MD  Blood Glucose Monitoring Suppl (ACCU-CHEK GUIDE) w/Device KIT Use to check blood sugar once daily. 10/18/23   Vicci Barnie NOVAK, MD  budesonide -glycopyrrolate -formoterol  (BREZTRI  AEROSPHERE) 160-9-4.8 MCG/ACT AERO inhaler Inhale 2 puffs into the lungs in the morning and at bedtime.  09/28/22   Brien Belvie BRAVO, MD  folic acid  (FOLVITE ) 1 MG tablet Take 1 tablet (1 mg total) by mouth daily. 10/18/23   Vicci Barnie NOVAK, MD  furosemide  (LASIX ) 40 MG tablet Take 1 tablet (40 mg total) by mouth daily. 10/18/23   Vicci Barnie NOVAK, MD  glipiZIDE  (GLUCOTROL  XL) 2.5 MG 24 hr tablet Take 1 tablet (2.5 mg total) by mouth daily with breakfast. 10/18/23   Vicci Barnie NOVAK, MD  glucose blood (ACCU-CHEK GUIDE TEST) test strip Use to check blood sugar once daily. 10/18/23   Vicci Barnie NOVAK, MD  losartan  (COZAAR ) 100 MG tablet Take 1 tablet (100 mg total) by mouth daily. 03/03/23   Danton Jon HERO, PA-C  meloxicam  (MOBIC ) 7.5 MG tablet Take 1 tablet (7.5 mg total) by mouth daily. 04/20/23   Persons, Ronal Dragon, PA  metoprolol  succinate (TOPROL -XL) 50 MG 24 hr tablet Take 1 tablet (50 mg total) by mouth daily. Take with or immediately following a meal. 06/21/23   Vicci Barnie NOVAK, MD  nystatin  ointment (MYCOSTATIN ) Apply 1 Application topically 2 (two) times daily. Mix with triamcinolone . Use for 2 weeks. 10/18/23   Vicci Barnie NOVAK, MD  pantoprazole  (PROTONIX ) 40 MG tablet Take 1 tablet (40 mg total) by mouth daily. 10/18/23   Vicci Barnie NOVAK, MD  predniSONE  (DELTASONE ) 10 MG tablet Take 1 tablet (10 mg total) by mouth daily with breakfast. 10/18/23   Vicci Barnie NOVAK, MD  pregabalin  (LYRICA ) 100 MG  capsule Take 1 capsule (100 mg total) by mouth 2 (two) times daily. 06/21/23   Vicci Barnie NOVAK, MD  terbinafine  (LAMISIL ) 250 MG tablet Take 1 tablet (250 mg total) by mouth daily. 10/18/23   Vicci Barnie NOVAK, MD  triamcinolone  (KENALOG ) 0.025 % ointment Apply 1 Application topically 2 (two) times daily. Mix with nystatin . Use for 2 weeks. Patient not taking: Reported on 10/18/2023 04/20/23   Alm Delon SAILOR, DO    Allergies: Patient has no known allergies.    Review of Systems  All other systems reviewed and are negative.   Updated Vital Signs BP 124/80   Pulse (!) 114    Temp (!) 102 F (38.9 C) (Axillary)   Resp 17   Ht 5' 9 (1.753 m)   SpO2 99%   BMI 28.65 kg/m   Physical Exam Vitals and nursing note reviewed.  Constitutional:      General: He is not in acute distress.    Appearance: Normal appearance. He is well-developed.  HENT:     Head: Normocephalic and atraumatic.  Eyes:     Conjunctiva/sclera: Conjunctivae normal.     Pupils: Pupils are equal, round, and reactive to light.  Cardiovascular:     Rate and Rhythm: Normal rate and regular rhythm.     Heart sounds: Normal heart sounds.  Pulmonary:     Effort: Pulmonary effort is normal. No respiratory distress.     Comments: Currently on BiPAP.  Appears comfortable.  Breath sounds are decreased at the left base.  Mild scattered expiratory wheezes in all other lung fields. Abdominal:     General: There is no distension.     Palpations: Abdomen is soft.     Tenderness: There is no abdominal tenderness.  Musculoskeletal:        General: No deformity. Normal range of motion.     Cervical back: Normal range of motion and neck supple.  Skin:    General: Skin is warm and dry.  Neurological:     General: No focal deficit present.     Mental Status: He is alert and oriented to person, place, and time.     (all labs ordered are listed, but only abnormal results are displayed) Labs Reviewed  CBC WITH DIFFERENTIAL/PLATELET - Abnormal; Notable for the following components:      Result Value   WBC 17.2 (*)    Neutro Abs 12.6 (*)    Monocytes Absolute 1.9 (*)    All other components within normal limits  I-STAT CHEM 8, ED - Abnormal; Notable for the following components:   BUN 26 (*)    Glucose, Bld 137 (*)    Calcium , Ion 1.11 (*)    All other components within normal limits  I-STAT VENOUS BLOOD GAS, ED - Abnormal; Notable for the following components:   pH, Ven 7.449 (*)    pCO2, Ven 42.9 (*)    Bicarbonate 29.7 (*)    Acid-Base Excess 5.0 (*)    Calcium , Ion 1.10 (*)    All other  components within normal limits  CULTURE, BLOOD (ROUTINE X 2)  CULTURE, BLOOD (ROUTINE X 2)  RESP PANEL BY RT-PCR (RSV, FLU A&B, COVID)  RVPGX2  COMPREHENSIVE METABOLIC PANEL WITH GFR  I-STAT CG4 LACTIC ACID, ED  TROPONIN I (HIGH SENSITIVITY)    EKG: EKG Interpretation Date/Time:  Saturday November 12 2023 08:15:59 EST Ventricular Rate:  116 PR Interval:    QRS Duration:  95 QT Interval:  332 QTC Calculation: 462  R Axis:   5  Text Interpretation: Sinus tachycardia Borderline low voltage, extremity leads Anteroseptal infarct, old Confirmed by Laurice Coy [phone]) on 11/12/2023 8:20:49 AM  Radiology: No results found.   Procedures   Medications Ordered in the ED  acetaminophen  (TYLENOL ) tablet 1,000 mg (has no administration in time range)  cefTRIAXone  (ROCEPHIN ) 2 g in sodium chloride  0.9 % 100 mL IVPB (has no administration in time range)  azithromycin  (ZITHROMAX ) 500 mg in sodium chloride  0.9 % 250 mL IVPB (has no administration in time range)                                    Medical Decision Making Patient arrives from home with respiratory distress.  Patient with history of COPD.  Initial interventions by EMS seem to have been successful with patient feeling much better after EMS transport and treatment.  Patient noted be febrile on arrival.    Patient left on BiPAP for continued respiratory support.  Workup demonstrates presence of left lower lobe infiltrate.  Broad-spectrum antibiotics administered.  Patient will benefit from admission.  Hospitalist service is aware of case and will evaluate for admission.  Amount and/or Complexity of Data Reviewed Labs: ordered. Radiology: ordered.  Risk OTC drugs. Decision regarding hospitalization.   CRITICAL CARE Performed by: Coy JAYSON Laurice   Total critical care time: 30 minutes  Critical care time was exclusive of separately billable procedures and treating other patients.  Critical care was necessary  to treat or prevent imminent or life-threatening deterioration.  Critical care was time spent personally by me on the following activities: development of treatment plan with patient and/or surrogate as well as nursing, discussions with consultants, evaluation of patient's response to treatment, examination of patient, obtaining history from patient or surrogate, ordering and performing treatments and interventions, ordering and review of laboratory studies, ordering and review of radiographic studies, pulse oximetry and re-evaluation of patient's condition.      Final diagnoses:  Respiratory distress    ED Discharge Orders     None          Laurice Coy JAYSON, MD 11/12/23 1047

## 2023-11-12 NOTE — Progress Notes (Signed)
 RT took pt off bipap and placed pt on 2L [place], sats 95%. Pt states his breathing feels much better and looks to be in no respiratory distress at this time. MD notified and RN to get a VBG in . Bipap at bedside if needed.

## 2023-11-12 NOTE — H&P (Signed)
 History and Physical    [name] FMW:[id number] DOB: [birth year] DOA: 11/12/2023 DOS: the patient was seen and examined on 11/12/2023 PCP: Vicci Barnie NOVAK, MD  Patient coming from: Home  Chief Complaint:  Chief Complaint  Patient presents with   Respiratory Distress   HPI: James [name] is a 63 y.o. male with medical history significant of HFpEF (EF 55-60% in 11/2022), COPD, chronic hypoxia on 4 L of nasal cannula, recurrent pneumonia, HTN, DM2, GERD, and history of alcohol use p/w SOB and found to have AECOPD iso LLL CAP.  The patient reported experiencing shortness of breath starting at four o'clock in the morning while in bed. The patient was not engaged in any activity at the onset of symptoms. The shortness of breath was accompanied by shaking and chills. The patient mentioned feeling sick on Wednesday and Thursday prior to this event, which included symptoms such as weakness and inability to breathe properly, leading to not working on those days. The patient also experienced sleepiness and tiredness. No fever was reported, and no temperature was measured. The patient attempted to cope with the SOB for two to three hours by resting in bed without using inhalers, but the condition worsened, prompting a call for medical assistance.  In the ED, pt febrile, tachycardic, and tachypneic w/ BiPAP initiated. Labs notable for WBC 17.2. CXR showed LLL PNA. EDP started CAP abx and requested medicine admission.   Review of Systems: As mentioned in the history of present illness. All other systems reviewed and are negative. Past Medical History:  Diagnosis Date   Acute on chronic respiratory failure with hypoxia (HCC) 06/08/2013   Acute on chronic respiratory failure with hypoxia and hypercapnia (HCC) 06/08/2013   AKI (acute kidney injury) 07/12/2022   Alcohol use disorder 11/23/2018   CAP (community acquired pneumonia) 05/17/2018   See admit 05/17/18 ? rml  with   covid pcr neg  - rx augmentin  > f/u cxr in 4-6 weeks is fine unless condition declines    Community acquired pneumonia 07/12/2021   Community acquired pneumonia of right lower lobe of lung 05/17/2018   See admit 05/17/18 ? 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   Recurrent pneumonia 02/20/2021   Tick bite 06/03/2018   Past Surgical History:  Procedure Laterality Date   ESOPHAGEAL MANOMETRY N/A 09/15/2022   Procedure: ESOPHAGEAL MANOMETRY (EM);  Surgeon: Abran Norleen SAILOR, MD;  Location: [facility];  Service: Gastroenterology;  Laterality: N/A;   LEFT HEART CATH AND CORONARY ANGIOGRAPHY N/A 12/07/2021   Procedure: LEFT HEART CATH AND CORONARY ANGIOGRAPHY;  Surgeon: Court Dorn PARAS, MD;  Location: [facility];  Service: Cardiovascular;  Laterality: N/A;   LUNG REMOVAL, PARTIAL     VIDEO ASSISTED THORACOSCOPY (VATS)/THOROCOTOMY Left 06/11/2013   Procedure: VIDEO ASSISTED THORACOSCOPY (VATS)/THOROCOTOMY;  Surgeon: Maude Fleeta Ochoa, MD;  Location: [facility] OR;  Service: Thoracic;  Laterality: Left;   VIDEO BRONCHOSCOPY N/A 06/11/2013   Procedure: VIDEO BRONCHOSCOPY;  Surgeon: Maude Fleeta  [place], MD;  Location: [facility] OR;  Service: Thoracic;  Laterality: N/A;   VIDEO BRONCHOSCOPY N/A 08/10/2018   Procedure: VIDEO BRONCHOSCOPY WITH FLUORO;  Surgeon: Claudene Toribio BROCKS, MD;  Location[facility];  Service: Endoscopy;  Laterality: N/A;   Social History:  reports that he quit smoking about 11 years ago. His smoking use included cigarettes. He started  smoking about 51 years ago. He has a 60 pack-year smoking history. He has quit using smokeless tobacco.  His smokeless tobacco use included chew. He reports current alcohol use of about 4.0 standard drinks of alcohol per week. He reports that he does not currently use drugs after having used the following drugs: Marijuana.  No Known Allergies  Family History  Problem Relation Age of Onset   Heart disease Father    COPD Sister     Prior to Admission medications   Medication Sig Start Date End Date Taking? Authorizing Provider  Accu-Chek Softclix Lancets lancets Use to check blood sugar once daily. 10/18/23   Vicci Barnie NOVAK, MD  albuterol  (PROVENTIL ) (2.5 MG/3ML) 0.083% nebulizer solution Inhale 3 mLs (2.5 mg total) by nebulization every 4 (four) hours as needed for wheezing or shortness of breath. 03/03/23   McClung, Angela M, PA-C  albuterol  (VENTOLIN  HFA) 108 (90 Base) MCG/ACT inhaler Inhale 2 puffs into the lungs every 6 (six) hours as needed for wheezing or shortness of breath. 10/18/23   Vicci Barnie NOVAK, MD  atorvastatin  (LIPITOR) 10 MG tablet Take 1 tablet (10 mg total) by mouth daily. 04/28/23   Vicci Barnie NOVAK, MD  Blood Glucose Monitoring Suppl (ACCU-CHEK GUIDE) w/Device KIT Use to check blood sugar once daily. 10/18/23   Vicci Barnie NOVAK, MD  budesonide -glycopyrrolate -formoterol  (BREZTRI  AEROSPHERE) 160-9-4.8 MCG/ACT AERO inhaler Inhale 2 puffs into the lungs in the morning and at bedtime. 09/28/22   Brien Belvie BRAVO, MD  folic acid  (FOLVITE ) 1 MG tablet Take 1 tablet (1 mg total) by mouth daily. 10/18/23   Vicci Barnie NOVAK, MD  furosemide  (LASIX ) 40 MG tablet Take 1 tablet (40 mg total) by mouth daily. 10/18/23   Vicci Barnie NOVAK, MD  glipiZIDE  (GLUCOTROL  XL) 2.5 MG 24 hr tablet Take 1 tablet (2.5 mg total) by mouth daily with breakfast. 10/18/23   Vicci Barnie NOVAK, MD  glucose blood (ACCU-CHEK GUIDE TEST) test strip Use to check blood sugar once daily. 10/18/23    Vicci Barnie NOVAK, MD  losartan  (COZAAR ) 100 MG tablet Take 1 tablet (100 mg total) by mouth daily. 03/03/23   Danton Jon HERO, PA-C  meloxicam  (MOBIC ) 7.5 MG tablet Take 1 tablet (7.5 mg total) by mouth daily. 04/20/23   Persons, Ronal Dragon, PA  metoprolol  succinate (TOPROL -XL) 50 MG 24 hr tablet Take 1 tablet (50 mg total) by mouth daily. Take with or immediately following a meal. 06/21/23   Vicci Barnie NOVAK, MD  nystatin  ointment (MYCOSTATIN ) Apply 1 Application topically 2 (two) times daily. Mix with triamcinolone . Use for 2 weeks. 10/18/23   Vicci Barnie NOVAK, MD  pantoprazole  (PROTONIX ) 40 MG tablet Take 1 tablet (40 mg total) by mouth daily. 10/18/23   Vicci Barnie NOVAK, MD  predniSONE  (DELTASONE ) 10 MG tablet Take 1 tablet (10 mg total) by mouth daily with breakfast. 10/18/23   Vicci Barnie NOVAK, MD  pregabalin  (LYRICA ) 100 MG capsule Take 1 capsule (100 mg total) by mouth 2 (two) times daily. 06/21/23   Vicci Barnie NOVAK, MD  terbinafine  (LAMISIL ) 250 MG tablet Take 1 tablet (250 mg total)  by mouth daily. 10/18/23   Vicci Barnie NOVAK, MD  triamcinolone  (KENALOG ) 0.025 % ointment Apply 1 Application topically 2 (two) times daily. Mix with nystatin . Use for 2 weeks. Patient not taking: Reported on 10/18/2023 04/20/23   Alm Delon SAILOR, DO    Physical Exam: Vitals:   11/12/23 0815 11/12/23 0827 11/12/23 0917 11/12/23 0931  BP:    120/87  Pulse:    (!) 107  Resp:    (!) 25  Temp: (!) 102 F (38.9 C)     TempSrc: Axillary     SpO2:   100% 99%  Height:  5' 9 (1.753 m)     General: Alert, oriented x3, resting comfortably in no acute distress Respiratory: LLL rhonchi w/ diffuse wheezing Cardiovascular: Regular rate and rhythm w/o m/r/g   Data Reviewed:  Lab Results  Component Value Date   WBC 17.2 (H) 11/12/2023   HGB 15.0 11/12/2023   HGB 14.3 11/12/2023   HCT 44.0 11/12/2023   HCT 42.0 11/12/2023   MCV 97.3 11/12/2023   PLT 253 11/12/2023   Lab Results  Component  Value Date   GLUCOSE 137 (H) 11/12/2023   CALCIUM  9.1 11/12/2023   NA 139 11/12/2023   NA 139 11/12/2023   K 4.0 11/12/2023   K 3.9 11/12/2023   CO2 28 11/12/2023   CL 101 11/12/2023   BUN 26 (H) 11/12/2023   CREATININE 1.10 11/12/2023   Lab Results  Component Value Date   ALT 20 11/12/2023   AST 23 11/12/2023   GGT 36 06/23/2016   ALKPHOS 38 11/12/2023   BILITOT 0.7 11/12/2023   Lab Results  Component Value Date   INR 0.9 05/07/2021   INR 0.9 02/20/2021   INR 1.0 04/20/2020   Radiology: DG Chest Port 1 View Result Date: 11/12/2023 EXAM: 1 VIEW(S) XRAY OF THE CHEST 11/12/2023 08:52:00 AM COMPARISON: 05/30/2023 CLINICAL HISTORY: SOB FINDINGS: LUNGS AND PLEURA: New consolidative change involving the left lung base. Left pleural thickening. Small left pleural effusion. No pulmonary edema. No pneumothorax. HEART AND MEDIASTINUM: Atherosclerotic plaque noted. No acute abnormality of the cardiac and mediastinal silhouettes. BONES AND SOFT TISSUES: Old healed left rib fractures. IMPRESSION: 1. Left lower lobe pneumonia. 2. Small left pleural effusion with left pleural thickening. Electronically signed by: Waddell Calk MD 11/12/2023 09:21 AM EST RP Workstation: [id number]    Assessment and Plan: 65M h/o COPD, chronic hypoxia on 4 L of nasal cannula, recurrent pneumonia, hypertension, GERD, type 2 diabetes mellitus and history of alcohol use p/w SOB and found to have AECOPD iso LLL CAP.  LLL CAP -IV CTX 1g daily to complete 5 day CAP course -PO azithromycin  500mg  daily to complete 3 day CAP course -Duonebs prn -Wean O2 as tolerated -Ambulatory pulse ox prior to d/c  AECOPD -IV abx per above -PO prednisone  40mg  daily for 5 days iso AECOPD -Triple inhaler therapy (LAMA/LABA/ICS); consider Trelegy at discharge -Duonebs prn -Start montelukast 10mg  at bedtime -Incentive spirometry and flutter valve prn -F/u BNP and diurese prn -Wean O2 as tolerated -Ambulatory pulse prior to  d/c -OP Pulm f/u on d/c; consider Pulm rehab referral as well  HFpEF -PTA atorvastatin , metoprolol  tartrate, losarta and furosemide   DM2 -PTA glipizide  2.5mg  daily + SSI TID AC prn -PTA Lyrica     Advance Care Planning:   Code Status: Full Code   Consults: N/A  Family Communication: Sister  Severity of Illness: The appropriate patient status for this patient is INPATIENT. Inpatient status is judged to be  reasonable and necessary in order to provide the required intensity of service to ensure the patient's safety. The patient's presenting symptoms, physical exam findings, and initial radiographic and laboratory data in the context of their chronic comorbidities is felt to place them at high risk for further clinical deterioration. Furthermore, it is not anticipated that the patient will be medically stable for discharge from the hospital within 2 midnights of admission.   * I certify that at the point of admission it is my clinical judgment that the patient will require inpatient hospital care spanning beyond 2 midnights from the point of admission due to high intensity of service, high risk for further deterioration and high frequency of surveillance required.*   ------- I spent 56 minutes reviewing previous notes, at the bedside counseling/discussing the treatment plan, and performing clinical documentation.  Author: Marsha Ada, MD 11/12/2023 11:56 AM  For on call review [email or web address].

## 2023-11-13 DIAGNOSIS — J441 Chronic obstructive pulmonary disease with (acute) exacerbation: Secondary | ICD-10-CM | POA: Diagnosis not present

## 2023-11-13 LAB — CBC
HCT: 38.3 % — ABNORMAL LOW (ref 39.0–52.0)
Hemoglobin: 12.4 g/dL — ABNORMAL LOW (ref 13.0–17.0)
MCH: 31.4 pg (ref 26.0–34.0)
MCHC: 32.4 g/dL (ref 30.0–36.0)
MCV: 97 fL (ref 80.0–100.0)
Platelets: 207 K/uL (ref 150–400)
RBC: 3.95 MIL/uL — ABNORMAL LOW (ref 4.22–5.81)
RDW: 12.8 % (ref 11.5–15.5)
WBC: 13.9 K/uL — ABNORMAL HIGH (ref 4.0–10.5)
nRBC: 0 % (ref 0.0–0.2)

## 2023-11-13 LAB — GLUCOSE, CAPILLARY
Glucose-Capillary: 156 mg/dL — ABNORMAL HIGH (ref 70–99)
Glucose-Capillary: 131 mg/dL — ABNORMAL HIGH (ref 70–99)
Glucose-Capillary: 187 mg/dL — ABNORMAL HIGH (ref 70–99)
Glucose-Capillary: 226 mg/dL — ABNORMAL HIGH (ref 70–99)

## 2023-11-13 LAB — CREATININE, SERUM
Creatinine, Ser: 1.01 mg/dL (ref 0.61–1.24)
GFR, Estimated: >60 mL/min (ref >60)

## 2023-11-13 MED ORDER — THIAMINE MONONITRATE 100 MG PO TABS
100.0000 mg | ORAL_TABLET | Freq: Every day | ORAL | Status: DC
  Administered 2023-11-13 – 2023-11-15 (×3): 100 mg via ORAL
  Filled 2023-11-13 (×3): qty 1

## 2023-11-13 MED ORDER — POLYVINYL ALCOHOL 1.4 % OP SOLN
1.0000 [drp] | OPHTHALMIC | Status: DC | PRN
  Filled 2023-11-13: qty 15

## 2023-11-13 MED ORDER — ACETAMINOPHEN 325 MG PO TABS: 650.0000 mg | ORAL_TABLET | Freq: Four times a day (QID) | ORAL | Status: DC | PRN

## 2023-11-13 MED ORDER — LORAZEPAM 2 MG/ML IJ SOLN: 1.0000 mg | INTRAMUSCULAR | Status: DC | PRN

## 2023-11-13 MED ORDER — LORAZEPAM 1 MG PO TABS
1.0000 mg | ORAL_TABLET | ORAL | Status: DC | PRN
  Filled 2023-11-13: qty 1

## 2023-11-13 MED ORDER — THIAMINE HCL 100 MG/ML IJ SOLN: 100.0000 mg | Freq: Every day | INTRAMUSCULAR | Status: DC

## 2023-11-13 MED ORDER — CHLORDIAZEPOXIDE HCL 25 MG PO CAPS: 25.0000 mg | ORAL_CAPSULE | Freq: Every day | ORAL | Status: DC

## 2023-11-13 MED ORDER — ONDANSETRON HCL 4 MG/2ML IJ SOLN: 4.0000 mg | Freq: Four times a day (QID) | INTRAMUSCULAR | Status: DC | PRN

## 2023-11-13 MED ORDER — HYDRALAZINE HCL 20 MG/ML IJ SOLN: 10.0000 mg | INTRAMUSCULAR | Status: DC | PRN

## 2023-11-13 MED ORDER — ADULT MULTIVITAMIN W/MINERALS CH
1.0000 | ORAL_TABLET | Freq: Every day | ORAL | Status: DC
  Administered 2023-11-13 – 2023-11-15 (×3): 1 via ORAL
  Filled 2023-11-13 (×3): qty 1

## 2023-11-13 MED ORDER — FOLIC ACID 1 MG PO TABS
1.0000 mg | ORAL_TABLET | Freq: Every day | ORAL | Status: DC
  Administered 2023-11-13 – 2023-11-15 (×3): 1 mg via ORAL
  Filled 2023-11-13 (×3): qty 1

## 2023-11-13 MED ORDER — CHLORDIAZEPOXIDE HCL 25 MG PO CAPS
25.0000 mg | ORAL_CAPSULE | Freq: Two times a day (BID) | ORAL | Status: AC
  Administered 2023-11-14 (×2): 25 mg via ORAL
  Filled 2023-11-13 (×2): qty 1

## 2023-11-13 MED ORDER — METHYLPREDNISOLONE SODIUM SUCC 40 MG IJ SOLR
40.0000 mg | Freq: Two times a day (BID) | INTRAMUSCULAR | Status: DC
  Administered 2023-11-13 – 2023-11-14 (×4): 40 mg via INTRAVENOUS
  Filled 2023-11-13 (×5): qty 1

## 2023-11-13 MED ORDER — METOPROLOL TARTRATE 5 MG/5ML IV SOLN: 5.0000 mg | INTRAVENOUS | Status: DC | PRN

## 2023-11-13 MED ORDER — CHLORDIAZEPOXIDE HCL 25 MG PO CAPS
25.0000 mg | ORAL_CAPSULE | Freq: Three times a day (TID) | ORAL | Status: AC
  Administered 2023-11-13 (×3): 25 mg via ORAL
  Filled 2023-11-13 (×3): qty 1

## 2023-11-13 MED ORDER — GLUCAGON HCL RDNA (DIAGNOSTIC) 1 MG IJ SOLR: 1.0000 mg | INTRAMUSCULAR | Status: DC | PRN

## 2023-11-13 NOTE — Plan of Care (Signed)

## 2023-11-13 NOTE — Hospital Course (Addendum)
 Brief Narrative:   63 year old with history of CHF EF 60%, COPD, chronic hypoxia on 4 L [place], recurrent pneumonia, HTN, DM2, GERD, alcohol use admitted for shortness of breath.  Patient found to have COPD exacerbation in the setting of left lower lobe pneumonia.  Assessment & Plan:  Acute COPD exacerbation Left lower lobe pneumonia Chronic hypoxia 4 L nasal cannula -Empiric IV Rocephin /azithromycin .  Bronchodilators scheduled and as needed.  I-S/flutter valve.  Steroids    HFpEF -Preserved EF 60%.  No signs of volume overload.  Continue home Toprol -XL, losartan , statin and Lasix .   DM2 Peripheral neuropathy -Sliding scale, Accu-Chek, glipizide  - Lyrica   GERD -PPI  Alcohol use - Monitor signs for any withdrawal.   DVT prophylaxis:Lovenox      Code Status: Full Code Family Communication:   Status is: Inpatient Remains inpatient appropriate because: Continue hospital stay for COPD exacerbation treatment. Hopefully dc tomorrow.    PT Follow up Recs:   Subjective: Feeling better today   Examination:  General exam: Appears calm and comfortable  Respiratory system: Diffuse bilateral diminished breath sounds; improved.  Cardiovascular system: S1 & S2 heard, RRR. No JVD, murmurs, rubs, gallops or clicks. No pedal edema. Gastrointestinal system: Abdomen is nondistended, soft and nontender. No organomegaly or masses felt. Normal bowel sounds heard. Central nervous system: Alert and oriented. No focal neurological deficits. Extremities: Symmetric 5 x 5 power. Skin: No rashes, lesions or ulcers Psychiatry: Judgement and insight appear normal. Mood & affect appropriate.

## 2023-11-13 NOTE — Plan of Care (Signed)
  Problem: Coping: Goal: Ability to adjust to condition or change in health will improve Outcome: Progressing   Problem: Metabolic: Goal: Ability to maintain appropriate glucose levels will improve Outcome: Progressing   Problem: Skin Integrity: Goal: Risk for impaired skin integrity will decrease Outcome: Progressing   Problem: Activity: Goal: Risk for activity intolerance will decrease Outcome: Progressing

## 2023-11-13 NOTE — Progress Notes (Signed)
 PROGRESS NOTE    James [name]  FMW:[id number] DOB: 13-Mar-1960 DOA: 11/12/2023 PCP: Vicci Barnie NOVAK, MD    Brief Narrative:   63 year old with history of CHF EF 60%, COPD, chronic hypoxia on 4 L [place], recurrent pneumonia, HTN, DM2, GERD, alcohol use admitted for shortness of breath.  Patient found to have COPD exacerbation in the setting of left lower lobe pneumonia.  Assessment & Plan:  Acute COPD exacerbation Left lower lobe pneumonia Chronic hypoxia 4 L nasal cannula -Empiric IV Rocephin /azithromycin .  Bronchodilators scheduled and as needed.  I-S/flutter valve.  Steroids    HFpEF -Preserved EF 60%.  No signs of volume overload.  Continue home Toprol -XL, losartan , statin and Lasix .   DM2 Peripheral neuropathy -Sliding scale, Accu-Chek, glipizide  - Lyrica   GERD -PPI  Alcohol use - Monitor signs for any withdrawal.   DVT prophylaxis:Lovenox      Code Status: Full Code Family Communication:   Status is: Inpatient Remains inpatient appropriate because: Continue hospital stay for COPD exacerbation treatment   PT Follow up Recs:   Subjective: Feeling little better this morning but still have exertional dyspnea Last drink was about 36 hours ago   Examination:  General exam: Appears calm and comfortable  Respiratory system: Diffuse bilateral diminished breath sounds Cardiovascular system: S1 & S2 heard, RRR. No JVD, murmurs, rubs, gallops or clicks. No pedal edema. Gastrointestinal system: Abdomen is nondistended, soft and nontender. No organomegaly or masses felt. Normal bowel sounds heard. Central nervous system: Alert and oriented. No focal neurological deficits. Extremities: Symmetric 5 x 5 power. Skin: No rashes, lesions or ulcers Psychiatry: Judgement and insight appear normal. Mood & affect appropriate.                Diet Orders (From admission, onward)     Start     Ordered   11/12/23 1209  Diet Carb Modified Fluid consistency: Thin;  Room service appropriate? Yes  Diet effective now       Question Answer Comment  Diet-HS Snack? Nothing   Calorie Level Medium 1600-2000   Fluid consistency: Thin   Room service appropriate? Yes      11/12/23 1208            Objective: Vitals:   11/12/23 2307 11/13/23 0405 11/13/23 0716 11/13/23 0919  BP: 112/67 121/71 (!) 110/57 126/72  Pulse: 75 66 67 76  Resp: (!) 22 17 17    Temp: 98.5 F (36.9 C) 98.4 F (36.9 C) 98.4 F (36.9 C)   TempSrc: Oral Oral Oral   SpO2: 95% 91% 95%   Weight:      Height:        Intake/Output Summary (Last 24 hours) at 11/13/2023 1030 Last data filed at 11/12/2023 2021 Gross per 24 hour  Intake 730.23 ml  Output --  Net 730.23 ml   Filed Weights   11/12/23 1518  Weight: 84.5 kg    Scheduled Meds:  atorvastatin   10 mg Oral Daily   azithromycin   500 mg Oral Daily   budesonide -glycopyrrolate -formoterol   2 puff Inhalation BID   chlordiazePOXIDE   25 mg Oral TID   Followed by   NOREEN ON 11/14/2023] chlordiazePOXIDE   25 mg Oral BID   Followed by   NOREEN ON 11/15/2023] chlordiazePOXIDE   25 mg Oral Q1500   enoxaparin  (LOVENOX ) injection  40 mg Subcutaneous Q24H   folic acid   1 mg Oral Daily   furosemide   40 mg Oral Daily   glipiZIDE   2.5 mg Oral Q breakfast  insulin  aspart  0-15 Units Subcutaneous TID WC   losartan   100 mg Oral Daily   methylPREDNISolone  (SOLU-MEDROL ) injection  40 mg Intravenous Q12H   metoprolol  succinate  50 mg Oral Daily   montelukast  10 mg Oral QHS   multivitamin with minerals  1 tablet Oral Daily   pantoprazole   40 mg Oral Daily   pregabalin   100 mg Oral BID   thiamine   100 mg Oral Daily   Or   thiamine   100 mg Intravenous Daily   Continuous Infusions:  cefTRIAXone  (ROCEPHIN )  IV 1 g (11/13/23 0931)    Nutritional status     Body mass index is 27.51 kg/m.  Data Reviewed:   CBC: Recent Labs  Lab 11/12/23 0827 11/12/23 0835 11/12/23 1401 11/13/23 0402  WBC 17.2*  --   --  13.9*   NEUTROABS 12.6*  --   --   --   HGB 13.7 14.3  15.0 14.3 12.4*  HCT 42.7 42.0  44.0 42.0 38.3*  MCV 97.3  --   --  97.0  PLT 253  --   --  207   Basic Metabolic Panel: Recent Labs  Lab 11/12/23 0827 11/12/23 0835 11/12/23 1401 11/13/23 0402  NA 138 139  139 138  --   K 4.0 3.9  4.0 4.8  --   CL 97* 101  --   --   CO2 28  --   --   --   GLUCOSE 140* 137*  --   --   BUN 22 26*  --   --   CREATININE 1.11 1.10  --  1.01  CALCIUM  9.1  --   --   --    GFR: Estimated Creatinine Clearance: 75.8 mL/min (by C-G formula based on SCr of 1.01 mg/dL). Liver Function Tests: Recent Labs  Lab 11/12/23 0827  AST 23  ALT 20  ALKPHOS 38  BILITOT 0.7  PROT 5.9*  ALBUMIN  3.5   No results for input(s): LIPASE, AMYLASE in the last 168 hours. No results for input(s): AMMONIA in the last 168 hours. Coagulation Profile: No results for input(s): INR, PROTIME in the last 168 hours. Cardiac Enzymes: No results for input(s): CKTOTAL, CKMB, CKMBINDEX, TROPONINI in the last 168 hours. BNP (last 3 results) No results for input(s): PROBNP in the last 8760 hours. HbA1C: Recent Labs    11/12/23 0827  HGBA1C 6.0*   CBG: Recent Labs  Lab 11/12/23 1642 11/12/23 2158 11/13/23 0626  GLUCAP 248* 185* 156*   Lipid Profile: No results for input(s): CHOL, HDL, LDLCALC, TRIG, CHOLHDL, LDLDIRECT in the last 72 hours. Thyroid Function Tests: No results for input(s): TSH, T4TOTAL, FREET4, T3FREE, THYROIDAB in the last 72 hours. Anemia Panel: No results for input(s): VITAMINB12, FOLATE, FERRITIN, TIBC, IRON , RETICCTPCT in the last 72 hours. Sepsis Labs: Recent Labs  Lab 11/12/23 0842 11/12/23 1211  LATICACIDVEN 1.6 0.9    Recent Results (from the past 240 hours)  Culture, blood (routine x 2)     Status: None (Preliminary result)   Collection Time: 11/12/23  8:23 AM   Specimen: BLOOD  Result Value Ref Range Status   Specimen  Description BLOOD SITE NOT SPECIFIED  Final   Special Requests   Final    BOTTLES DRAWN AEROBIC ONLY Blood Culture results may not be optimal due to an inadequate volume of blood received in culture bottles   Culture   Final    NO GROWTH < 24 HOURS Performed at [facility], [address] [place], [place] [address]    Report Status PENDING  Incomplete  Resp panel by RT-PCR (RSV, Flu A&B, Covid) Anterior Nasal Swab     Status: None   Collection Time: 11/12/23  8:27 AM   Specimen: Anterior Nasal Swab  Result Value Ref Range Status   SARS Coronavirus 2 by RT PCR NEGATIVE NEGATIVE Final   Influenza A by PCR NEGATIVE NEGATIVE Final   Influenza B by PCR NEGATIVE NEGATIVE Final    Comment: (NOTE) The Xpert Xpress SARS-CoV-2/FLU/RSV plus assay is intended as an aid in the diagnosis of influenza from Nasopharyngeal swab specimens and should not be used as a sole basis for treatment. Nasal washings and aspirates are unacceptable for Xpert Xpress SARS-CoV-2/FLU/RSV testing.  Fact Sheet for Patients: bloggercourse.com  Fact Sheet for Healthcare Providers: seriousbroker.it  This test is not yet approved or cleared by the [place]  FDA and has been authorized for detection and/or diagnosis of SARS-CoV-2 by FDA under an Emergency Use Authorization (EUA). This EUA will remain in effect (meaning this test can be used) for the duration of the COVID-19 declaration under Section 564(b)(1) of the Act, 21 U.S.C. section 360bbb-3(b)(1), unless the authorization is terminated or revoked.     Resp Syncytial Virus by PCR NEGATIVE NEGATIVE Final    Comment: (NOTE) Fact Sheet for Patients: bloggercourse.com  Fact Sheet for Healthcare Providers: seriousbroker.it  This test is not yet approved or cleared by the [place]  FDA and has been authorized for detection and/or diagnosis of  SARS-CoV-2 by FDA under an Emergency Use Authorization (EUA). This EUA will remain in effect (meaning this test can be used) for the duration of the COVID-19 declaration under Section 564(b)(1) of the Act, 21 U.S.C. section 360bbb-3(b)(1), unless the authorization is terminated or revoked.  Performed at [facility], [address], [place], [place] [address]   Culture, blood (routine x 2)     Status: None (Preliminary result)   Collection Time: 11/12/23  8:33 AM   Specimen: BLOOD RIGHT ARM  Result Value Ref Range Status   Specimen Description BLOOD RIGHT ARM  Final   Special Requests   Final    BOTTLES DRAWN AEROBIC AND ANAEROBIC Blood Culture results may not be optimal due to an inadequate volume of blood received in culture bottles   Culture   Final    NO GROWTH < 24 HOURS Performed at [facility], [address], [place], [place] [address]    Report Status PENDING  Incomplete  MRSA Next Gen by PCR, Nasal     Status: None   Collection Time: 11/12/23  3:20 PM   Specimen: Nasal Mucosa; Nasal Swab  Result Value Ref Range Status   MRSA by PCR Next Gen NOT DETECTED NOT DETECTED Final    Comment: (NOTE) The GeneXpert MRSA Assay (FDA approved for NASAL specimens only), is one component of a comprehensive MRSA colonization surveillance program. It is not intended to diagnose MRSA infection nor to guide or monitor treatment for MRSA infections. Test performance is not FDA approved in patients less than 65 years old. Performed at [facility], [address], [place], [place] [address]          Radiology Studies: DG Chest Port 1 View Result Date: 11/12/2023 EXAM: 1 VIEW(S) XRAY OF THE CHEST 11/12/2023 08:52:00 AM COMPARISON: 05/30/2023 CLINICAL HISTORY: SOB FINDINGS: LUNGS AND PLEURA: New consolidative change involving the left lung base. Left pleural thickening. Small  left pleural effusion. No pulmonary edema. No pneumothorax. HEART AND MEDIASTINUM:  Atherosclerotic plaque noted. No acute abnormality of the cardiac and mediastinal silhouettes. BONES AND SOFT TISSUES: Old healed left rib fractures. IMPRESSION: 1. Left lower lobe pneumonia. 2. Small left pleural effusion with left pleural thickening. Electronically signed by: Waddell Calk MD 11/12/2023 09:21 AM EST RP Workstation: [id number]           LOS: 1 day   Time spent= 35 mins    Burgess JAYSON Dare, MD [facility]  If 7PM-7AM, please contact night-coverage  11/13/2023, 10:30 AM

## 2023-11-14 DIAGNOSIS — J441 Chronic obstructive pulmonary disease with (acute) exacerbation: Secondary | ICD-10-CM | POA: Diagnosis not present

## 2023-11-14 LAB — CBC
HCT: 38.7 % — ABNORMAL LOW (ref 39.0–52.0)
Hemoglobin: 12.5 g/dL — ABNORMAL LOW (ref 13.0–17.0)
MCH: 31.6 pg (ref 26.0–34.0)
MCHC: 32.3 g/dL (ref 30.0–36.0)
MCV: 98 fL (ref 80.0–100.0)
Platelets: 187 K/uL (ref 150–400)
RBC: 3.95 MIL/uL — ABNORMAL LOW (ref 4.22–5.81)
RDW: 12.9 % (ref 11.5–15.5)
WBC: 13.2 K/uL — ABNORMAL HIGH (ref 4.0–10.5)
nRBC: 0 % (ref 0.0–0.2)

## 2023-11-14 LAB — BASIC METABOLIC PANEL WITH GFR
Anion gap: 11 (ref 5–15)
BUN: 23 mg/dL (ref 8–23)
CO2: 26 mmol/L (ref 22–32)
Calcium: 8.9 mg/dL (ref 8.9–10.3)
Chloride: 103 mmol/L (ref 98–111)
Creatinine, Ser: 0.84 mg/dL (ref 0.61–1.24)
GFR, Estimated: >60 mL/min (ref >60)
Glucose, Bld: 183 mg/dL — ABNORMAL HIGH (ref 70–99)
Potassium: 4.3 mmol/L (ref 3.5–5.1)
Sodium: 140 mmol/L (ref 135–145)

## 2023-11-14 LAB — GLUCOSE, CAPILLARY
Glucose-Capillary: 172 mg/dL — ABNORMAL HIGH (ref 70–99)
Glucose-Capillary: 206 mg/dL — ABNORMAL HIGH (ref 70–99)
Glucose-Capillary: 158 mg/dL — ABNORMAL HIGH (ref 70–99)
Glucose-Capillary: 224 mg/dL — ABNORMAL HIGH (ref 70–99)

## 2023-11-14 NOTE — Plan of Care (Signed)
  Problem: Nutritional: Goal: Maintenance of adequate nutrition will improve Outcome: Progressing   Problem: Clinical Measurements: Goal: Diagnostic test results will improve Outcome: Progressing Goal: Cardiovascular complication will be avoided Outcome: Progressing   Problem: Activity: Goal: Risk for activity intolerance will decrease Outcome: Progressing   Problem: Nutrition: Goal: Adequate nutrition will be maintained Outcome: Progressing   Problem: Elimination: Goal: Will not experience complications related to bowel motility Outcome: Progressing Goal: Will not experience complications related to urinary retention Outcome: Progressing   Problem: Pain Managment: Goal: General experience of comfort will improve and/or be controlled Outcome: Progressing

## 2023-11-14 NOTE — TOC CM/SW Note (Signed)
 Transition of Care [facility]) - Inpatient Brief Assessment   Patient Details  Name: James [name] MRN: [id number] Date of Birth: [birth year]  Transition of Care [facility]) CM/SW Contact:    Lauraine FORBES Saa, LCSWA Phone Number: 11/14/2023, 10:55 AM   Clinical Narrative:  10:55 AM Per chart review, patient resides at home alone. Patient has a PCP and insurance. Patient does not have SNF history. Patient has HH history with [facility]. Patient has DME (oxygen , nebulizer machine) history with [facility]. Patient has CIR history with [facility]. Patient's preferred pharmacy's are [facility] and [facility]. Patient accepted CSW offer of SDOH (transportation) and substance use resources. CSW provided resources and information on how to renew food stamps. No other TOC needs identified at this time. TOC will continue to follow.  Transition of Care Asessment: Insurance and Status: Insurance coverage has been reviewed Patient has primary care physician: Yes Home environment has been reviewed: Private Residence Prior level of function:: N/A Prior/Current Home Services: No current home services Social Drivers of Health Review: SDOH reviewed no interventions necessary Readmission risk has been reviewed: Yes (Currently Yellow 17%) Transition of care needs: no transition of care needs at this time

## 2023-11-14 NOTE — Discharge Instructions (Signed)
 [employer] Guide Inpatient Behavioral Health/Residential Substance Abuse Treatment - Adults The [employer]'s "U5235577" is a great source of information about community services available.  Access by dialing 2-1-1 from anywhere in [place] , or by website -  PooledIncome.pl.    (Updated 04/2015)   Crisis Assistance 24 hours a day     Services Offered       Area [employer] 24-hour crisis assistance: [phone] [place], [place]  [facility] 24-hour crisis assistance:639-856-5824 [place], [place]  [place] 24-hour crisis assistance: 641-128-5997 [place], [place]  [facility] Access to Care Line 24-hour crisis assistance; 938-657-3636 All  [facility] 24-hour crisis response line: 630-336-9881 All    Other Local Resources (Updated 01/2015)   Inpatient Behavioral Health/Residential Substance Abuse Treatment Programs     Services          Address and Phone Number  [facility] ([facility])   14-day residential rehabilitation  [phone] [address] [place], [place]  [facility] ([facility])    Detox - private pay only 14-day residential rehabilitation -  Medicaid, insurance, private pay only [phone], or [phone] [address] [place], [place] [address]   [facility] only Multiple facilities [phone] admissions    [facility] ([facility])   90-day program Must be homeless to participate   [phone], or [phone] Daniel Mcalpine, [facility]  [facility] only [phone], or  [phone] [address] [place], [place] [address]  [facility] Residential Treatment Services Must make an appointment Accepts private pay, Ursula Mosses [facility] [phone]  [address] [place], [place] [address]   [facility] Females only Associated with the [facility] [phone] [address] [place], [place] [address]  [facility] only [phone], or [phone] [address] [place], [id number]  [facility]     Detox Residential rehabilitation Private insurance only Multiple locations [phone] admissions  [facility]     Private pay Private insurance [phone] [address] [place], [place] [address]  [facility]     Males only Fee required at time of admission [phone] [address] [place], [place] [address]  [facility]     Private pay only   [phone] [address] [place], [place]  RTS (Residential Firefighter)    Detox - private pay, Medicaid Residential rehabilitation for males  - Medicare, Medicaid, insurance, private pay [phone] [address] [place], [place]   [facility]              Walk-in interviews Monday - Saturday from 8 am - 4 pm Individuals with legal charges are not eligible [phone] [address] [place], [place] [address]  [facility]  Must be willing to work Must attend Alcoholics Anonymous meetings [phone] [address] [place], [place]   [facility] [employer]    Faith-based program Private pay only [phone] [address] [place], [place]                                        Outpatient Substance Abuse  Treatment- uninsured  Narcotics Anonymous 24-HOUR HELPLINE Pre-recorded for Meeting Schedules [facility] [phone]  [email or web address] ALCOHOLICS ANONYMOUS  [place]    Answering Service [phone] Please Note: All [place]  Meetings are Non-smoking FindSpice.es  Alcohol and Drug Services -  Insurance: [employer] /State funding/private insurance Methadone, suboxone/Intensive outpatient  [place]   [phone] Fax: [phone] [address] [place], [place], [address] [place] [phone] Fax: [phone]    [address] [place], [place], [address] ([address] [place], [place], [place], [place], [place], [place], [place], [place]) [facility] [email or web address].[email or web address]/ Accepts State funding/Medicaid Transitional housing, Intensive Outpatient Treatment, Outpatient treatment, Veterans Services  Phone: [phone] Fax: [phone] Address: [address], [place] [address]   [employer] of Care (http://carterscircleofcare.info/) Insurance: Medicaid Case Management, Administrator, arts, Medication Management, Outpatient Therapy, Psychosocial Rehabilitation, Substance Abuse Intensive Outpatient  Phone: [phone] Fax: [phone] [address], [place], [place], [address]  [facility] Medicaid, most private insurance providers Types of Program: Individual/Group Therapy, Substance Abuse Treatment  Phone: [place] [phone] Fax: [phone] [address], [place], [place], [address] [place] [phone] [address], [address] [place], [place], [address]  [facility]  Medicaid Types of Program: SAIOP  Phone: [phone] Fax: [phone] [address] [place], [place], [place], [address] [facility] Medicaid/state funds Crisis line [phone] [facility] [phone] [place] [phone] [place] [phone]  [facility] [address];  [place], [place] [address] [phone]  Substance Abuse Intensive Outpatient Program [facility] [address] [place], [place] [address] [phone]- Substance abuse treatment  Successful Transitions  Insurance: [facility], [address], sliding scale Types of Program: substance abuse treatment, transportation assistance Phone: [phone] Fax: [phone] Address: [address], [place] [address] [facility] (TrendSwap.ch) Insurance: UHC, [place], [place], [place] of  [place] Program: addiction counseling, detoxification,  Phone: [phone]  Fax: [phone] Address: [address] [place], [place] [address]  [facility] (statewide facilities/programs) [address] (Medicaid/state funds) [place], [place] [address]                      http://barrett.com/ [phone] Daniel mcalpine- [phone] [place]- [phone] [facility] [place] (2 Locations) (Medicaid/state funds) --[address]  walk in 8:30-12 and 1-2:30 [place], [id number]   [facility]- [phone] --[address] [place], [place] [address]  [phone] walk in 8:30-12 and 2-3:30  [facility] state funds/medicaid [address] [place], [place] [address] [id number] [facility] (Suboxone clinic) Medicaid/state funds  [address]  [place], [place] [address] [phone]   [facility]  [address] [place], [place] [address]  [phone] (24 hours) [place]- [address] [place], [place] [address]  [phone] (24 hours) [place]- [address] [place] [phone] [place]- [address] [place] [phone] [place] [address] [place] [phone] [facility]- Medicaid and state funds  [place]- [address] [place], [place] [address] [phone] (24 hours) [place]- [address] [place], [place] [address] [phone] [facility]- [address] [place], [place] [address] [phone] (24 hours) [place] [address] [place], [place]  [address] [phone] [place]- [address] [facility] [address] [place] [phone]

## 2023-11-14 NOTE — Plan of Care (Signed)

## 2023-11-14 NOTE — Progress Notes (Signed)
 Mobility Specialist Progress Note;   11/14/23 1002  Mobility  Activity Ambulated with assistance  Level of Assistance Minimal assist, patient does 75% or more  Assistive Device Other (Comment) (pushed O2 tank)  Distance Ambulated (ft) 250 ft  Activity Response Tolerated well  Mobility Referral Yes  Mobility visit 1 Mobility  Mobility Specialist Start Time (ACUTE ONLY) 1002  Mobility Specialist Stop Time (ACUTE ONLY) 1011  Mobility Specialist Time Calculation (min) (ACUTE ONLY) 9 min   Pt agreeable to mobility. On 3LO2 upon arrival. Required MinA during ambulation for safety as pt seemed a bit unsteady. Ambulated on 3LO2, VSS throughout when able to receive accurate pleth. Pt returned back to bed and left with all needs met, alarm on.   Lauraine Erm Mobility Specialist Please contact via SecureChat or [employer] [phone]

## 2023-11-14 NOTE — Progress Notes (Signed)
 PROGRESS NOTE    James [name]  FMW:[id number] DOB: December 14, 1960 DOA: 11/12/2023 PCP: Vicci Barnie NOVAK, MD    Brief Narrative:   63 year old with history of CHF EF 60%, COPD, chronic hypoxia on 4 L [place], recurrent pneumonia, HTN, DM2, GERD, alcohol use admitted for shortness of breath.  Patient found to have COPD exacerbation in the setting of left lower lobe pneumonia.  Assessment & Plan:  Acute COPD exacerbation Left lower lobe pneumonia Chronic hypoxia 4 L nasal cannula -Empiric IV Rocephin /azithromycin .  Bronchodilators scheduled and as needed.  I-S/flutter valve.  Steroids    HFpEF -Preserved EF 60%.  No signs of volume overload.  Continue home Toprol -XL, losartan , statin and Lasix .   DM2 Peripheral neuropathy -Sliding scale, Accu-Chek, glipizide  - Lyrica   GERD -PPI  Alcohol use - Monitor signs for any withdrawal.   DVT prophylaxis:Lovenox      Code Status: Full Code Family Communication:   Status is: Inpatient Remains inpatient appropriate because: Continue hospital stay for COPD exacerbation treatment. Hopefully dc tomorrow.    PT Follow up Recs:   Subjective: Feeling better today   Examination:  General exam: Appears calm and comfortable  Respiratory system: Diffuse bilateral diminished breath sounds; improved.  Cardiovascular system: S1 & S2 heard, RRR. No JVD, murmurs, rubs, gallops or clicks. No pedal edema. Gastrointestinal system: Abdomen is nondistended, soft and nontender. No organomegaly or masses felt. Normal bowel sounds heard. Central nervous system: Alert and oriented. No focal neurological deficits. Extremities: Symmetric 5 x 5 power. Skin: No rashes, lesions or ulcers Psychiatry: Judgement and insight appear normal. Mood & affect appropriate.                Diet Orders (From admission, onward)     Start     Ordered   11/12/23 1209  Diet Carb Modified Fluid consistency: Thin; Room service appropriate? Yes  Diet effective now        Question Answer Comment  Diet-HS Snack? Nothing   Calorie Level Medium 1600-2000   Fluid consistency: Thin   Room service appropriate? Yes      11/12/23 1208            Objective: Vitals:   11/13/23 2243 11/14/23 0404 11/14/23 0732 11/14/23 0808  BP: (!) 111/49 114/71 (!) 109/51   Pulse: 76 61 76   Resp: 20 17 15    Temp: 97.7 F (36.5 C) 98.6 F (37 C) 98.7 F (37.1 C)   TempSrc: Oral Oral Oral   SpO2: 92% 91% 95% 93%  Weight:      Height:        Intake/Output Summary (Last 24 hours) at 11/14/2023 0944 Last data filed at 11/13/2023 1934 Gross per 24 hour  Intake 820 ml  Output --  Net 820 ml   Filed Weights   11/12/23 1518  Weight: 84.5 kg    Scheduled Meds:  atorvastatin   10 mg Oral Daily   azithromycin   500 mg Oral Daily   budesonide -glycopyrrolate -formoterol   2 puff Inhalation BID   chlordiazePOXIDE   25 mg Oral BID   Followed by   NOREEN ON 11/15/2023] chlordiazePOXIDE   25 mg Oral Q1500   enoxaparin  (LOVENOX ) injection  40 mg Subcutaneous Q24H   folic acid   1 mg Oral Daily   furosemide   40 mg Oral Daily   glipiZIDE   2.5 mg Oral Q breakfast   insulin  aspart  0-15 Units Subcutaneous TID WC   losartan   100 mg Oral Daily   methylPREDNISolone  (SOLU-MEDROL ) injection  40  mg Intravenous Q12H   metoprolol  succinate  50 mg Oral Daily   montelukast  10 mg Oral QHS   multivitamin with minerals  1 tablet Oral Daily   pantoprazole   40 mg Oral Daily   pregabalin   100 mg Oral BID   thiamine   100 mg Oral Daily   Or   thiamine   100 mg Intravenous Daily   Continuous Infusions:  cefTRIAXone  (ROCEPHIN )  IV Stopped (11/13/23 1001)    Nutritional status     Body mass index is 27.51 kg/m.  Data Reviewed:   CBC: Recent Labs  Lab 11/12/23 0827 11/12/23 0835 11/12/23 1401 11/13/23 0402 11/14/23 0049  WBC 17.2*  --   --  13.9* 13.2*  NEUTROABS 12.6*  --   --   --   --   HGB 13.7 14.3  15.0 14.3 12.4* 12.5*  HCT 42.7 42.0  44.0 42.0 38.3* 38.7*   MCV 97.3  --   --  97.0 98.0  PLT 253  --   --  207 187   Basic Metabolic Panel: Recent Labs  Lab 11/12/23 0827 11/12/23 0835 11/12/23 1401 11/13/23 0402 11/14/23 0400  NA 138 139  139 138  --  140  K 4.0 3.9  4.0 4.8  --  4.3  CL 97* 101  --   --  103  CO2 28  --   --   --  26  GLUCOSE 140* 137*  --   --  183*  BUN 22 26*  --   --  23  CREATININE 1.11 1.10  --  1.01 0.84  CALCIUM  9.1  --   --   --  8.9   GFR: Estimated Creatinine Clearance: 91.2 mL/min (by C-G formula based on SCr of 0.84 mg/dL). Liver Function Tests: Recent Labs  Lab 11/12/23 0827  AST 23  ALT 20  ALKPHOS 38  BILITOT 0.7  PROT 5.9*  ALBUMIN  3.5   No results for input(s): LIPASE, AMYLASE in the last 168 hours. No results for input(s): AMMONIA in the last 168 hours. Coagulation Profile: No results for input(s): INR, PROTIME in the last 168 hours. Cardiac Enzymes: No results for input(s): CKTOTAL, CKMB, CKMBINDEX, TROPONINI in the last 168 hours. BNP (last 3 results) No results for input(s): PROBNP in the last 8760 hours. HbA1C: Recent Labs    11/12/23 0827  HGBA1C 6.0*   CBG: Recent Labs  Lab 11/13/23 0626 11/13/23 1134 11/13/23 1605 11/13/23 2111 11/14/23 0613  GLUCAP 156* 131* 187* 226* 172*   Lipid Profile: No results for input(s): CHOL, HDL, LDLCALC, TRIG, CHOLHDL, LDLDIRECT in the last 72 hours. Thyroid Function Tests: No results for input(s): TSH, T4TOTAL, FREET4, T3FREE, THYROIDAB in the last 72 hours. Anemia Panel: No results for input(s): VITAMINB12, FOLATE, FERRITIN, TIBC, IRON , RETICCTPCT in the last 72 hours. Sepsis Labs: Recent Labs  Lab 11/12/23 0842 11/12/23 1211  LATICACIDVEN 1.6 0.9    Recent Results (from the past 240 hours)  Culture, blood (routine x 2)     Status: None (Preliminary result)   Collection Time: 11/12/23  8:23 AM   Specimen: BLOOD  Result Value Ref Range Status   Specimen Description  BLOOD SITE NOT SPECIFIED  Final   Special Requests   Final    BOTTLES DRAWN AEROBIC ONLY Blood Culture results may not be optimal due to an inadequate volume of blood received in culture bottles   Culture   Final    NO GROWTH 2 DAYS Performed at [facility]  [facility], [address], [place], [place] [address]    Report Status PENDING  Incomplete  Resp panel by RT-PCR (RSV, Flu A&B, Covid) Anterior Nasal Swab     Status: None   Collection Time: 11/12/23  8:27 AM   Specimen: Anterior Nasal Swab  Result Value Ref Range Status   SARS Coronavirus 2 by RT PCR NEGATIVE NEGATIVE Final   Influenza A by PCR NEGATIVE NEGATIVE Final   Influenza B by PCR NEGATIVE NEGATIVE Final    Comment: (NOTE) The Xpert Xpress SARS-CoV-2/FLU/RSV plus assay is intended as an aid in the diagnosis of influenza from Nasopharyngeal swab specimens and should not be used as a sole basis for treatment. Nasal washings and aspirates are unacceptable for Xpert Xpress SARS-CoV-2/FLU/RSV testing.  Fact Sheet for Patients: bloggercourse.com  Fact Sheet for Healthcare Providers: seriousbroker.it  This test is not yet approved or cleared by the [place]  FDA and has been authorized for detection and/or diagnosis of SARS-CoV-2 by FDA under an Emergency Use Authorization (EUA). This EUA will remain in effect (meaning this test can be used) for the duration of the COVID-19 declaration under Section 564(b)(1) of the Act, 21 U.S.C. section 360bbb-3(b)(1), unless the authorization is terminated or revoked.     Resp Syncytial Virus by PCR NEGATIVE NEGATIVE Final    Comment: (NOTE) Fact Sheet for Patients: bloggercourse.com  Fact Sheet for Healthcare Providers: seriousbroker.it  This test is not yet approved or cleared by the [place]  FDA and has been authorized for detection and/or diagnosis of SARS-CoV-2 by FDA  under an Emergency Use Authorization (EUA). This EUA will remain in effect (meaning this test can be used) for the duration of the COVID-19 declaration under Section 564(b)(1) of the Act, 21 U.S.C. section 360bbb-3(b)(1), unless the authorization is terminated or revoked.  Performed at [facility], [address], [place], [place] [address]   Culture, blood (routine x 2)     Status: None (Preliminary result)   Collection Time: 11/12/23  8:33 AM   Specimen: BLOOD RIGHT ARM  Result Value Ref Range Status   Specimen Description BLOOD RIGHT ARM  Final   Special Requests   Final    BOTTLES DRAWN AEROBIC AND ANAEROBIC Blood Culture results may not be optimal due to an inadequate volume of blood received in culture bottles   Culture   Final    NO GROWTH 2 DAYS Performed at [facility], [address], [place], [place] [address]    Report Status PENDING  Incomplete  MRSA Next Gen by PCR, Nasal     Status: None   Collection Time: 11/12/23  3:20 PM   Specimen: Nasal Mucosa; Nasal Swab  Result Value Ref Range Status   MRSA by PCR Next Gen NOT DETECTED NOT DETECTED Final    Comment: (NOTE) The GeneXpert MRSA Assay (FDA approved for NASAL specimens only), is one component of a comprehensive MRSA colonization surveillance program. It is not intended to diagnose MRSA infection nor to guide or monitor treatment for MRSA infections. Test performance is not FDA approved in patients less than 19 years old. Performed at [facility], [address], [place], [place] [address]          Radiology Studies: No results found.         LOS: 2 days   Time spent= 35 mins    Burgess JAYSON Dare, MD [facility]  If 7PM-7AM, please contact night-coverage  11/14/2023, 9:44 AM

## 2023-11-15 ENCOUNTER — Other Ambulatory Visit (HOSPITAL_COMMUNITY): Payer: Self-pay

## 2023-11-15 DIAGNOSIS — J441 Chronic obstructive pulmonary disease with (acute) exacerbation: Secondary | ICD-10-CM | POA: Diagnosis not present

## 2023-11-15 LAB — BASIC METABOLIC PANEL WITH GFR
Anion gap: 7 (ref 5–15)
BUN: 22 mg/dL (ref 8–23)
CO2: 31 mmol/L (ref 22–32)
Calcium: 8.8 mg/dL — ABNORMAL LOW (ref 8.9–10.3)
Chloride: 101 mmol/L (ref 98–111)
Creatinine, Ser: 0.88 mg/dL (ref 0.61–1.24)
GFR, Estimated: >60 mL/min (ref >60)
Glucose, Bld: 177 mg/dL — ABNORMAL HIGH (ref 70–99)
Potassium: 4.4 mmol/L (ref 3.5–5.1)
Sodium: 139 mmol/L (ref 135–145)

## 2023-11-15 LAB — GLUCOSE, CAPILLARY: Glucose-Capillary: 170 mg/dL — ABNORMAL HIGH (ref 70–99)

## 2023-11-15 MED ORDER — AMOXICILLIN-POT CLAVULANATE 875-125 MG PO TABS
1.0000 | ORAL_TABLET | Freq: Two times a day (BID) | ORAL | Status: DC
  Administered 2023-11-15: 1 via ORAL
  Filled 2023-11-15: qty 1

## 2023-11-15 MED ORDER — AMOXICILLIN-POT CLAVULANATE 875-125 MG PO TABS
1.0000 | ORAL_TABLET | Freq: Two times a day (BID) | ORAL | 0 refills | Status: AC
  Filled 2023-11-15: qty 4, 2d supply, fill #0

## 2023-11-15 MED ORDER — ALBUTEROL SULFATE HFA 108 (90 BASE) MCG/ACT IN AERS
2.0000 | INHALATION_SPRAY | Freq: Four times a day (QID) | RESPIRATORY_TRACT | 0 refills | Status: DC | PRN
  Filled 2023-11-15: qty 18, 25d supply, fill #0

## 2023-11-15 MED ORDER — PREDNISONE 10 MG PO TABS
ORAL_TABLET | ORAL | 0 refills | Status: DC
  Filled 2023-11-15: qty 48, 30d supply, fill #0

## 2023-11-15 NOTE — Progress Notes (Signed)
 Patient provided with verbal discharge instructions. Paper copy of discharge provided to patient. RN answered all questions. VSS at discharge. IV removed. Patient belongings sent with patient. Patient transferred to d/c lounge. O2 to be delivered. TOC medication picked up.

## 2023-11-15 NOTE — Plan of Care (Signed)
  Problem: Coping: Goal: Ability to adjust to condition or change in health will improve Outcome: Progressing   Problem: Fluid Volume: Goal: Ability to maintain a balanced intake and output will improve Outcome: Progressing   Problem: Health Behavior/Discharge Planning: Goal: Ability to identify and utilize available resources and services will improve Outcome: Progressing Goal: Ability to manage health-related needs will improve Outcome: Progressing   Problem: Nutritional: Goal: Maintenance of adequate nutrition will improve Outcome: Progressing   Problem: Tissue Perfusion: Goal: Adequacy of tissue perfusion will improve Outcome: Progressing   Problem: Skin Integrity: Goal: Risk for impaired skin integrity will decrease Outcome: Progressing   Problem: Education: Goal: Knowledge of General Education information will improve Description: Including pain rating scale, medication(s)/side effects and non-pharmacologic comfort measures Outcome: Progressing   Problem: Nutrition: Goal: Adequate nutrition will be maintained Outcome: Progressing   Problem: Pain Managment: Goal: General experience of comfort will improve and/or be controlled Outcome: Progressing

## 2023-11-15 NOTE — TOC CM/SW Note (Signed)
 PAtient for discharge today. HAs home oxygen  with [employer] , needs portable oxygen  DME and  transportation home. Patient will be in the discharge lounge.   NCN called Arthea with [employer]. [employer] will deliver portable oxygen  DME to patient in discharge lounge. Discharge lounge will arrange transportation home. Nurse aware

## 2023-11-15 NOTE — Discharge Summary (Signed)
 Physician Discharge Summary  RODRIGUES URBANEK FMW:[id number] DOB: 06-11-1960 DOA: 11/12/2023  PCP: Vicci Barnie NOVAK, MD  Admit date: 11/12/2023 Discharge date: 11/15/2023  Admitted From: Home Disposition: Home  Recommendations for Outpatient Follow-up:  Follow up with PCP in 1-2 weeks Please obtain BMP/CBC in one week your next doctors visit.  Prolonged prednisone  prescription given. Complete course of Augmentin , EOT 11/12 Albuterol  prescription given   Discharge Condition: Stable CODE STATUS: Full code Diet recommendation: Heart healthy  Brief/Interim Summary: Brief Narrative:   63 year old with history of CHF EF 60%, COPD, chronic hypoxia on 4 L [place], recurrent pneumonia, HTN, DM2, GERD, alcohol use admitted for shortness of breath.  Patient found to have COPD exacerbation in the setting of left lower lobe pneumonia.  Patient started on empiric antibiotics and steroids.  Clinically started improving. Will discharge him on prolonged course of steroids and finish his antibiotic course.  He reports he has nebulizers at home, will prescribe/refill albuterol  inhaler.  Assessment & Plan:  Acute COPD exacerbation Left lower lobe pneumonia Chronic hypoxia 4 L nasal cannula - Completed azithromycin , change IV Rocephin  to Augmentin , EOT 11/12.  Bronchodilators scheduled and as needed.  I-S/flutter valve.  Steroids.  Will transition to prolonged p.o. taper course    HFpEF -Preserved EF 60%.  No signs of volume overload.  Continue home Toprol -XL, losartan , statin and Lasix .   DM2 Peripheral neuropathy -Sliding scale, Accu-Chek, glipizide  - Lyrica   GERD -PPI  Alcohol use - Monitor signs for any withdrawal.  Completed Librium  taper   DVT prophylaxis:Lovenox      Code Status: Full Code Family Communication:   Status is: Inpatient Remains inpatient appropriate because: Discharge   PT Follow up Recs:   Subjective: Feeling better today Wishing to go  home  Examination:  General exam: Appears calm and comfortable  Respiratory system: Diffuse bilateral diminished breath sounds; improved.  Cardiovascular system: S1 & S2 heard, RRR. No JVD, murmurs, rubs, gallops or clicks. No pedal edema. Gastrointestinal system: Abdomen is nondistended, soft and nontender. No organomegaly or masses felt. Normal bowel sounds heard. Central nervous system: Alert and oriented. No focal neurological deficits. Extremities: Symmetric 5 x 5 power. Skin: No rashes, lesions or ulcers Psychiatry: Judgement and insight appear normal. Mood & affect appropriate.    Discharge Diagnoses:  Active Problems:   COPD exacerbation (HCC)      Discharge Exam: Vitals:   11/15/23 0731 11/15/23 0937  BP: 122/71 122/71  Pulse: 63 63  Resp: 13   Temp: 98.3 F (36.8 C)   SpO2: 93%    Vitals:   11/14/23 2312 11/15/23 0421 11/15/23 0731 11/15/23 0937  BP: 128/80 119/74 122/71 122/71  Pulse: 71 62 63 63  Resp: 20 18 13    Temp: 98.5 F (36.9 C) 98.3 F (36.8 C) 98.3 F (36.8 C)   TempSrc: Oral Oral Oral   SpO2: 93% 94% 93%   Weight:      Height:          Discharge Instructions   Allergies as of 11/15/2023   No Known Allergies      Medication List     STOP taking these medications    meloxicam  7.5 MG tablet Commonly known as: Mobic    terbinafine  250 MG tablet Commonly known as: LAMISIL        TAKE these medications    Accu-Chek Guide Test test strip Generic drug: glucose blood Use to check blood sugar once daily.   Accu-Chek Softclix Lancets lancets Use to check blood sugar  once daily.   albuterol  (2.5 MG/3ML) 0.083% nebulizer solution Commonly known as: PROVENTIL  Inhale 3 mLs (2.5 mg total) by nebulization every 4 (four) hours as needed for wheezing or shortness of breath. What changed: when to take this   Ventolin  HFA 108 (90 Base) MCG/ACT inhaler Generic drug: albuterol  Inhale 2 puffs into the lungs every 6 (six) hours as  needed for wheezing or shortness of breath. What changed: Another medication with the same name was changed. Make sure you understand how and when to take each.   amoxicillin -clavulanate 875-125 MG tablet Commonly known as: AUGMENTIN  Take 1 tablet by mouth every 12 (twelve) hours for 2 days. Notes to patient: Last dose given at 0930   atorvastatin  10 MG tablet Commonly known as: LIPITOR Take 1 tablet (10 mg total) by mouth daily.   Breztri  Aerosphere 160-9-4.8 MCG/ACT Aero inhaler Generic drug: budesonide -glycopyrrolate -formoterol  Inhale 2 puffs into the lungs in the morning and at bedtime.   folic acid  1 MG tablet Commonly known as: FOLVITE  Take 1 tablet (1 mg total) by mouth daily.   furosemide  40 MG tablet Commonly known as: LASIX  Take 1 tablet (40 mg total) by mouth daily.   glipiZIDE  2.5 MG 24 hr tablet Commonly known as: GLUCOTROL  XL Take 1 tablet (2.5 mg total) by mouth daily with breakfast.   losartan  100 MG tablet Commonly known as: COZAAR  Take 1 tablet (100 mg total) by mouth daily.   metoprolol  succinate 50 MG 24 hr tablet Commonly known as: TOPROL -XL Take 1 tablet (50 mg total) by mouth daily. Take with or immediately following a meal.   nystatin  ointment Commonly known as: MYCOSTATIN  Apply 1 Application topically 2 (two) times daily. Mix with triamcinolone . Use for 2 weeks. What changed:  when to take this additional instructions   pantoprazole  40 MG tablet Commonly known as: PROTONIX  Take 1 tablet (40 mg total) by mouth daily.   predniSONE  10 MG tablet Commonly known as: DELTASONE  Take 4 tablets (40 mg total) by mouth daily with breakfast for 3 days, THEN 3 tablets (30 mg total) daily with breakfast for 3 days, THEN 2 tablets (20 mg total) daily with breakfast for 3 days, THEN 1 tablet (10 mg total) daily with breakfast for 21 days. Start taking on: November 15, 2023 What changed: See the new instructions.   pregabalin  100 MG capsule Commonly known  as: Lyrica  Take 1 capsule (100 mg total) by mouth 2 (two) times daily.   triamcinolone  0.025 % ointment Commonly known as: KENALOG  Apply 1 Application topically 2 (two) times daily. Mix with nystatin . Use for 2 weeks.        No Known Allergies  You were cared for by a hospitalist during your hospital stay. If you have any questions about your discharge medications or the care you received while you were in the hospital after you are discharged, you can call the unit and asked to speak with the hospitalist on call if the hospitalist that took care of you is not available. Once you are discharged, your primary care physician will handle any further medical issues. Please note that no refills for any discharge medications will be authorized once you are discharged, as it is imperative that you return to your primary care physician (or establish a relationship with a primary care physician if you do not have one) for your aftercare needs so that they can reassess your need for medications and monitor your lab values.  You were cared for by a hospitalist during  your hospital stay. If you have any questions about your discharge medications or the care you received while you were in the hospital after you are discharged, you can call the unit and asked to speak with the hospitalist on call if the hospitalist that took care of you is not available. Once you are discharged, your primary care physician will handle any further medical issues. Please note that NO REFILLS for any discharge medications will be authorized once you are discharged, as it is imperative that you return to your primary care physician (or establish a relationship with a primary care physician if you do not have one) for your aftercare needs so that they can reassess your need for medications and monitor your lab values.  Please request your Prim.MD to go over all Hospital Tests and Procedure/Radiological results at the follow up, please  get all Hospital records sent to your Prim MD by signing hospital release before you go home.  Get CBC, CMP, 2 view Chest X ray checked  by Primary MD during your next visit or SNF MD in 5-7 days ( we routinely change or add medications that can affect your baseline labs and fluid status, therefore we recommend that you get the mentioned basic workup next visit with your PCP, your PCP may decide not to get them or add new tests based on their clinical decision)  On your next visit with your primary care physician please Get Medicines reviewed and adjusted.  If you experience worsening of your admission symptoms, develop shortness of breath, life threatening emergency, suicidal or homicidal thoughts you must seek medical attention immediately by calling 911 or calling your MD immediately  if symptoms less severe.  You Must read complete instructions/literature along with all the possible adverse reactions/side effects for all the Medicines you take and that have been prescribed to you. Take any new Medicines after you have completely understood and accpet all the possible adverse reactions/side effects.   Do not drive, operate heavy machinery, perform activities at heights, swimming or participation in water  activities or provide baby sitting services if your were admitted for syncope or siezures until you have seen by Primary MD or a Neurologist and advised to do so again.  Do not drive when taking Pain medications.   Procedures/Studies: DG Chest Port 1 View Result Date: 11/12/2023 EXAM: 1 VIEW(S) XRAY OF THE CHEST 11/12/2023 08:52:00 AM COMPARISON: 05/30/2023 CLINICAL HISTORY: SOB FINDINGS: LUNGS AND PLEURA: New consolidative change involving the left lung base. Left pleural thickening. Small left pleural effusion. No pulmonary edema. No pneumothorax. HEART AND MEDIASTINUM: Atherosclerotic plaque noted. No acute abnormality of the cardiac and mediastinal silhouettes. BONES AND SOFT TISSUES: Old  healed left rib fractures. IMPRESSION: 1. Left lower lobe pneumonia. 2. Small left pleural effusion with left pleural thickening. Electronically signed by: Waddell Calk MD 11/12/2023 09:21 AM EST RP Workstation: [id number]     The results of significant diagnostics from this hospitalization (including imaging, microbiology, ancillary and laboratory) are listed below for reference.     Microbiology: Recent Results (from the past 240 hours)  Culture, blood (routine x 2)     Status: None (Preliminary result)   Collection Time: 11/12/23  8:23 AM   Specimen: BLOOD  Result Value Ref Range Status   Specimen Description BLOOD SITE NOT SPECIFIED  Final   Special Requests   Final    BOTTLES DRAWN AEROBIC ONLY Blood Culture results may not be optimal due to an inadequate volume of blood received in  culture bottles   Culture   Final    NO GROWTH 3 DAYS Performed at [facility], [address], [place], [place] [address]    Report Status PENDING  Incomplete  Resp panel by RT-PCR (RSV, Flu A&B, Covid) Anterior Nasal Swab     Status: None   Collection Time: 11/12/23  8:27 AM   Specimen: Anterior Nasal Swab  Result Value Ref Range Status   SARS Coronavirus 2 by RT PCR NEGATIVE NEGATIVE Final   Influenza A by PCR NEGATIVE NEGATIVE Final   Influenza B by PCR NEGATIVE NEGATIVE Final    Comment: (NOTE) The Xpert Xpress SARS-CoV-2/FLU/RSV plus assay is intended as an aid in the diagnosis of influenza from Nasopharyngeal swab specimens and should not be used as a sole basis for treatment. Nasal washings and aspirates are unacceptable for Xpert Xpress SARS-CoV-2/FLU/RSV testing.  Fact Sheet for Patients: bloggercourse.com  Fact Sheet for Healthcare Providers: seriousbroker.it  This test is not yet approved or cleared by the [place]  FDA and has been authorized for detection and/or diagnosis of SARS-CoV-2 by FDA under an Emergency Use  Authorization (EUA). This EUA will remain in effect (meaning this test can be used) for the duration of the COVID-19 declaration under Section 564(b)(1) of the Act, 21 U.S.C. section 360bbb-3(b)(1), unless the authorization is terminated or revoked.     Resp Syncytial Virus by PCR NEGATIVE NEGATIVE Final    Comment: (NOTE) Fact Sheet for Patients: bloggercourse.com  Fact Sheet for Healthcare Providers: seriousbroker.it  This test is not yet approved or cleared by the [place]  FDA and has been authorized for detection and/or diagnosis of SARS-CoV-2 by FDA under an Emergency Use Authorization (EUA). This EUA will remain in effect (meaning this test can be used) for the duration of the COVID-19 declaration under Section 564(b)(1) of the Act, 21 U.S.C. section 360bbb-3(b)(1), unless the authorization is terminated or revoked.  Performed at [facility], [address] [place], [place] [address]   Culture, blood (routine x 2)     Status: None (Preliminary result)   Collection Time: 11/12/23  8:33 AM   Specimen: BLOOD RIGHT ARM  Result Value Ref Range Status   Specimen Description BLOOD RIGHT ARM  Final   Special Requests   Final    BOTTLES DRAWN AEROBIC AND ANAEROBIC Blood Culture results may not be optimal due to an inadequate volume of blood received in culture bottles   Culture   Final    NO GROWTH 3 DAYS Performed at [facility], [address] [place], [place] [address]    Report Status PENDING  Incomplete  MRSA Next Gen by PCR, Nasal     Status: None   Collection Time: 11/12/23  3:20 PM   Specimen: Nasal Mucosa; Nasal Swab  Result Value Ref Range Status   MRSA by PCR Next Gen NOT DETECTED NOT DETECTED Final    Comment: (NOTE) The GeneXpert MRSA Assay (FDA approved for NASAL specimens only), is one component of a comprehensive MRSA colonization surveillance program. It is not intended to diagnose MRSA  infection nor to guide or monitor treatment for MRSA infections. Test performance is not FDA approved in patients less than 15 years old. Performed at [facility], [address], [place], [place] [address]      Labs: BNP (last 3 results) Recent Labs    02/16/23 0120 05/30/23 0055 11/12/23 0827  BNP 46.2 40.2 86.6   Basic Metabolic Panel:  Recent Labs  Lab 11/12/23 0827 11/12/23 0835 11/12/23 1401 11/13/23 0402 11/14/23 0400 11/15/23 0222  NA 138 139  139 138  --  140 139  K 4.0 3.9  4.0 4.8  --  4.3 4.4  CL 97* 101  --   --  103 101  CO2 28  --   --   --  26 31  GLUCOSE 140* 137*  --   --  183* 177*  BUN 22 26*  --   --  23 22  CREATININE 1.11 1.10  --  1.01 0.84 0.88  CALCIUM  9.1  --   --   --  8.9 8.8*   Liver Function Tests: Recent Labs  Lab 11/12/23 0827  AST 23  ALT 20  ALKPHOS 38  BILITOT 0.7  PROT 5.9*  ALBUMIN  3.5   No results for input(s): LIPASE, AMYLASE in the last 168 hours. No results for input(s): AMMONIA in the last 168 hours. CBC: Recent Labs  Lab 11/12/23 0827 11/12/23 0835 11/12/23 1401 11/13/23 0402 11/14/23 0049  WBC 17.2*  --   --  13.9* 13.2*  NEUTROABS 12.6*  --   --   --   --   HGB 13.7 14.3  15.0 14.3 12.4* 12.5*  HCT 42.7 42.0  44.0 42.0 38.3* 38.7*  MCV 97.3  --   --  97.0 98.0  PLT 253  --   --  207 187   Cardiac Enzymes: No results for input(s): CKTOTAL, CKMB, CKMBINDEX, TROPONINI in the last 168 hours. BNP: Invalid input(s): POCBNP CBG: Recent Labs  Lab 11/14/23 0613 11/14/23 1119 11/14/23 1700 11/14/23 2121 11/15/23 0630  GLUCAP 172* 206* 158* 224* 170*   D-Dimer No results for input(s): DDIMER in the last 72 hours. Hgb A1c No results for input(s): HGBA1C in the last 72 hours. Lipid Profile No results for input(s): CHOL, HDL, LDLCALC, TRIG, CHOLHDL, LDLDIRECT in the last 72 hours. Thyroid function studies No results for input(s): TSH, T4TOTAL, T3FREE,  THYROIDAB in the last 72 hours.  Invalid input(s): FREET3 Anemia work up No results for input(s): VITAMINB12, FOLATE, FERRITIN, TIBC, IRON , RETICCTPCT in the last 72 hours. Urinalysis    Component Value Date/Time   COLORURINE YELLOW 08/12/2023 2220   APPEARANCEUR CLEAR 08/12/2023 2220   LABSPEC 1.021 08/12/2023 2220   PHURINE 5.0 08/12/2023 2220   GLUCOSEU NEGATIVE 08/12/2023 2220   HGBUR NEGATIVE 08/12/2023 2220   BILIRUBINUR NEGATIVE 08/12/2023 2220   BILIRUBINUR negative 09/25/2018 1432   KETONESUR NEGATIVE 08/12/2023 2220   PROTEINUR NEGATIVE 08/12/2023 2220   UROBILINOGEN 0.2 02/19/2019 1029   NITRITE NEGATIVE 08/12/2023 2220   LEUKOCYTESUR NEGATIVE 08/12/2023 2220   Sepsis Labs Recent Labs  Lab 11/12/23 0827 11/13/23 0402 11/14/23 0049  WBC 17.2* 13.9* 13.2*   Microbiology Recent Results (from the past 240 hours)  Culture, blood (routine x 2)     Status: None (Preliminary result)   Collection Time: 11/12/23  8:23 AM   Specimen: BLOOD  Result Value Ref Range Status   Specimen Description BLOOD SITE NOT SPECIFIED  Final   Special Requests   Final    BOTTLES DRAWN AEROBIC ONLY Blood Culture results may not be optimal due to an inadequate volume of blood received in culture bottles   Culture   Final    NO GROWTH 3 DAYS Performed at [facility], [address] [place], [place] [address]    Report Status PENDING  Incomplete  Resp panel by RT-PCR (RSV, Flu  A&B, Covid) Anterior Nasal Swab     Status: None   Collection Time: 11/12/23  8:27 AM   Specimen: Anterior Nasal Swab  Result Value Ref Range Status   SARS Coronavirus 2 by RT PCR NEGATIVE NEGATIVE Final   Influenza A by PCR NEGATIVE NEGATIVE Final   Influenza B by PCR NEGATIVE NEGATIVE Final    Comment: (NOTE) The Xpert Xpress SARS-CoV-2/FLU/RSV plus assay is intended as an aid in the diagnosis of influenza from Nasopharyngeal swab specimens and should not be used as a sole basis for  treatment. Nasal washings and aspirates are unacceptable for Xpert Xpress SARS-CoV-2/FLU/RSV testing.  Fact Sheet for Patients: bloggercourse.com  Fact Sheet for Healthcare Providers: seriousbroker.it  This test is not yet approved or cleared by the [place]  FDA and has been authorized for detection and/or diagnosis of SARS-CoV-2 by FDA under an Emergency Use Authorization (EUA). This EUA will remain in effect (meaning this test can be used) for the duration of the COVID-19 declaration under Section 564(b)(1) of the Act, 21 U.S.C. section 360bbb-3(b)(1), unless the authorization is terminated or revoked.     Resp Syncytial Virus by PCR NEGATIVE NEGATIVE Final    Comment: (NOTE) Fact Sheet for Patients: bloggercourse.com  Fact Sheet for Healthcare Providers: seriousbroker.it  This test is not yet approved or cleared by the [place]  FDA and has been authorized for detection and/or diagnosis of SARS-CoV-2 by FDA under an Emergency Use Authorization (EUA). This EUA will remain in effect (meaning this test can be used) for the duration of the COVID-19 declaration under Section 564(b)(1) of the Act, 21 U.S.C. section 360bbb-3(b)(1), unless the authorization is terminated or revoked.  Performed at [facility], [address] [place], [place] [address]   Culture, blood (routine x 2)     Status: None (Preliminary result)   Collection Time: 11/12/23  8:33 AM   Specimen: BLOOD RIGHT ARM  Result Value Ref Range Status   Specimen Description BLOOD RIGHT ARM  Final   Special Requests   Final    BOTTLES DRAWN AEROBIC AND ANAEROBIC Blood Culture results may not be optimal due to an inadequate volume of blood received in culture bottles   Culture   Final    NO GROWTH 3 DAYS Performed at [facility], [address], [place], [place] [address]    Report Status  PENDING  Incomplete  MRSA Next Gen by PCR, Nasal     Status: None   Collection Time: 11/12/23  3:20 PM   Specimen: Nasal Mucosa; Nasal Swab  Result Value Ref Range Status   MRSA by PCR Next Gen NOT DETECTED NOT DETECTED Final    Comment: (NOTE) The GeneXpert MRSA Assay (FDA approved for NASAL specimens only), is one component of a comprehensive MRSA colonization surveillance program. It is not intended to diagnose MRSA infection nor to guide or monitor treatment for MRSA infections. Test performance is not FDA approved in patients less than 17 years old. Performed at [facility], [address] [place], [place] [address]      Time coordinating discharge:  I have spent 35 minutes face to face with the patient and on the ward discussing the patients care, assessment, plan and disposition with other care givers. >50% of the time was devoted counseling the patient about the risks and benefits of treatment/Discharge disposition and coordinating care.   SIGNED:   Burgess JAYSON Dare, MD  [facility] 11/15/2023, 10:00 AM   If  7PM-7AM, please contact night-coverage

## 2023-11-16 ENCOUNTER — Other Ambulatory Visit: Payer: Self-pay

## 2023-11-16 ENCOUNTER — Encounter: Payer: Self-pay | Admitting: Pharmacist

## 2023-11-16 ENCOUNTER — Other Ambulatory Visit: Payer: Self-pay | Admitting: Critical Care Medicine

## 2023-11-16 ENCOUNTER — Other Ambulatory Visit (HOSPITAL_COMMUNITY): Payer: Self-pay

## 2023-11-16 ENCOUNTER — Telehealth: Payer: Self-pay

## 2023-11-16 MED ORDER — BREZTRI AEROSPHERE 160-9-4.8 MCG/ACT IN AERO
2.0000 | INHALATION_SPRAY | Freq: Two times a day (BID) | RESPIRATORY_TRACT | 11 refills | Status: AC
  Filled 2023-11-16 – 2023-12-19 (×3): qty 10.7, 30d supply, fill #0

## 2023-11-16 NOTE — Transitions of Care (Post Inpatient/ED Visit) (Signed)
   11/16/2023  Name: James [name] MRN: [id number] DOB: August 31, 1960  Today's TOC FU Call Status: Today's TOC FU Call Status:: Unsuccessful Call (1st Attempt) Unsuccessful Call (1st Attempt) Date: 11/16/23  Attempted to reach the patient regarding the most recent Inpatient/ED visit.  Follow Up Plan: Additional outreach attempts will be made to reach the patient to complete the Transitions of Care (Post Inpatient/ED visit) call.   Arvin Seip RN, BSN, CCM [employer], Population Health Case Manager Phone: [phone]

## 2023-11-17 ENCOUNTER — Telehealth: Payer: Self-pay

## 2023-11-17 LAB — CULTURE, BLOOD (ROUTINE X 2)
Culture: NO GROWTH
Culture: NO GROWTH

## 2023-11-17 NOTE — Transitions of Care (Post Inpatient/ED Visit) (Signed)
   11/17/2023  Name: James [name] MRN: [id number] DOB: 10/31/1960  Today's TOC FU Call Status: Today's TOC FU Call Status:: Unsuccessful Call (2nd Attempt) Unsuccessful Call (2nd Attempt) Date: 11/17/23  Attempted to reach the patient regarding the most recent Inpatient/ED visit.  Follow Up Plan: Additional outreach attempts will be made to reach the patient to complete the Transitions of Care (Post Inpatient/ED visit) call.   Arvin Seip RN, BSN, CCM [employer], Population Health Case Manager Phone: [phone]

## 2023-11-18 ENCOUNTER — Telehealth: Payer: Self-pay | Admitting: *Deleted

## 2023-11-18 NOTE — Transitions of Care (Post Inpatient/ED Visit) (Signed)
   11/18/2023  Name: ADIS STURGILL MRN: [id number] DOB: [birth year]  Today's TOC FU Call Status: Today's TOC FU Call Status:: Unsuccessful Call (3rd Attempt) Unsuccessful Call (3rd Attempt) Date: 11/18/23  Attempted to reach the patient regarding the most recent Inpatient/ED visit.  Follow Up Plan: No further outreach attempts will be made at this time. We have been unable to contact the patient.  Andrea Dimes RN, BSN [place]  [facility] RN Care Manager [phone]

## 2023-11-21 ENCOUNTER — Other Ambulatory Visit: Payer: Self-pay

## 2023-11-27 NOTE — Progress Notes (Incomplete)
 Patient Pulmonology Office Visit   Subjective:  Patient ID: James [name], male    DOB: 1960/01/09  MRN: [id number]  Referred by: Vicci Barnie NOVAK, MD  CC: No chief complaint on file.   HPI James [name] is a 63 y.o. male with former smoker  with pertinent PMH of HFpEF, COPD on Trelegy and albuterol  with several exacerbations over the last year  who was seen on 01/27/22 for lung nodules by Dr. Brenna.   Smoking history of 2 PPD for 20 years starting at age 64. He quit smoking in 2014 after an accident at work. 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 His breathing at this time is the same as it has been for several years and he has grown accustomed to pursed-lip breathing. He has had several hospitalizations over the last year for pneumonia which he states did not provide him much relief overall. He states that he is doing well with Trelegy daily but using his PRN albuterol  inhalor up to 4 times per day. Breathing is worse in hot weather but not positional.   Currently on breztri  daily   Recent admission was on 11/15/23 for LLL pneumonia  {PULM QUESTIONNAIRES (Optional):33196}  ROS  Allergies: Patient has no known allergies.  Current Outpatient Medications:    Accu-Chek Softclix Lancets lancets, Use to check blood sugar once daily., Disp: 100 each, Rfl: 12   albuterol  (PROVENTIL ) (2.5 MG/3ML) 0.083% nebulizer solution, Inhale 3 mLs (2.5 mg total) by nebulization every 4 (four) hours as needed for wheezing or shortness of breath. (Patient taking differently: Take 2.5 mg by nebulization daily as needed for wheezing or shortness of breath.), Disp: 270 mL, Rfl: 1   albuterol  (VENTOLIN  HFA) 108 (90 Base) MCG/ACT inhaler, Inhale 2 puffs into the lungs every 6 (six) hours as needed for wheezing or shortness of breath., Disp: 18 g, Rfl: 0   atorvastatin  (LIPITOR) 10 MG tablet, Take 1 tablet (10 mg total) by mouth daily., Disp:  90 tablet, Rfl: 3   budesonide -glycopyrrolate -formoterol  (BREZTRI  AEROSPHERE) 160-9-4.8 MCG/ACT AERO inhaler, Inhale 2 puffs into the lungs in the morning and at bedtime., Disp: 10.7 g, Rfl: 11   folic acid  (FOLVITE ) 1 MG tablet, Take 1 tablet (1 mg total) by mouth daily., Disp: 90 tablet, Rfl: 1   furosemide  (LASIX ) 40 MG tablet, Take 1 tablet (40 mg total) by mouth daily., Disp: 90 tablet, Rfl: 1   glipiZIDE  (GLUCOTROL  XL) 2.5 MG 24 hr tablet, Take 1 tablet (2.5 mg total) by mouth daily with breakfast., Disp: 90 tablet, Rfl: 1   glucose blood (ACCU-CHEK GUIDE TEST) test strip, Use to check blood sugar once daily., Disp: 100 each, Rfl: 12   losartan  (COZAAR ) 100 MG tablet, Take 1 tablet (100 mg total) by mouth daily., Disp: 90 tablet, Rfl: 2   metoprolol  succinate (TOPROL -XL) 50 MG 24 hr tablet, Take 1 tablet (50 mg total) by mouth daily. Take with or immediately following a meal., Disp: 90 tablet, Rfl: 2   nystatin  ointment (MYCOSTATIN ), Apply 1 Application topically 2 (two) times daily. Mix with triamcinolone . Use for 2 weeks. (Patient taking differently: Apply 1 Application topically daily. Mix with triamcinolone .), Disp: 30 g, Rfl: 2   pantoprazole  (PROTONIX ) 40 MG tablet, Take 1 tablet (40 mg total) by mouth daily., Disp: 90 tablet, Rfl: 1   predniSONE  (DELTASONE ) 10 MG tablet, Take 4 tablets (40 mg total) by mouth daily with breakfast for 3 days, THEN 3  tablets (30 mg total) daily with breakfast for 3 days, THEN 2 tablets (20 mg total) daily with breakfast for 3 days, THEN 1 tablet (10 mg total) daily with breakfast for 21 days., Disp: 48 tablet, Rfl: 0   pregabalin  (LYRICA ) 100 MG capsule, Take 1 capsule (100 mg total) by mouth 2 (two) times daily., Disp: 60 capsule, Rfl: 5   triamcinolone  (KENALOG ) 0.025 % ointment, Apply 1 Application topically 2 (two) times daily. Mix with nystatin . Use for 2 weeks. (Patient not taking: No sig reported), Disp: 30 g, Rfl: 2 Past Medical History:  Diagnosis  Date   Acute on chronic respiratory failure with hypoxia (HCC) 06/08/2013   Acute on chronic respiratory failure with hypoxia and hypercapnia (HCC) 06/08/2013   AKI (acute kidney injury) 07/12/2022   Alcohol  use disorder 11/23/2018   CAP (community acquired pneumonia) 05/17/2018   See admit 05/17/18 ? rml  with   covid pcr neg - rx augmentin  > f/u cxr in 4-6 weeks is fine unless condition declines    Community acquired pneumonia 07/12/2021   Community acquired pneumonia of right lower lobe of lung 05/17/2018   See admit 05/17/18 ? 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   Recurrent pneumonia 02/20/2021   Tick bite 06/03/2018   Past Surgical History:  Procedure Laterality Date   ESOPHAGEAL MANOMETRY N/A 09/15/2022   Procedure: ESOPHAGEAL MANOMETRY (EM);  Surgeon: Abran Norleen SAILOR, MD;  Location: [facility];  Service: Gastroenterology;  Laterality: N/A;   LEFT HEART CATH AND CORONARY ANGIOGRAPHY N/A 12/07/2021   Procedure: LEFT HEART CATH AND CORONARY ANGIOGRAPHY;  Surgeon: Court Dorn PARAS, MD;  [facility];  Service: Cardiovascular;  Laterality: N/A;   LUNG REMOVAL, PARTIAL     VIDEO ASSISTED THORACOSCOPY (VATS)/THOROCOTOMY Left 06/11/2013   Procedure: VIDEO ASSISTED THORACOSCOPY (VATS)/THOROCOTOMY;  Surgeon: Maude Fleeta Ochoa, MD;  Location: [facility] OR;  Service: Thoracic;  Laterality: Left;   VIDEO BRONCHOSCOPY N/A 06/11/2013   Procedure: VIDEO BRONCHOSCOPY;  Surgeon: Maude Fleeta Ochoa, MD;  Location: [facility] OR;   Service: Thoracic;  Laterality: N/A;   VIDEO BRONCHOSCOPY N/A 08/10/2018   Procedure: VIDEO BRONCHOSCOPY WITH FLUORO;  Surgeon: Claudene Toribio BROCKS, MD;  Location: [facility] ENDOSCOPY;  Service: Endoscopy;  Laterality: N/A;   Family History  Problem Relation Age of Onset   Heart disease Father    COPD Sister    Social History   Socioeconomic History   Marital status: Divorced    Spouse name: Not on file   Number of children: Not on file   Years of education: Not on file   Highest education level: Not on file  Occupational History   Occupation: Curator   Occupation: Painter  Tobacco Use   Smoking status: Former    Current packs/day: 0.00    Average packs/day: 1.5 packs/day for 40.0 years (60.0 ttl pk-yrs)    Types: Cigarettes    Start date: 30    Quit date: 2014    Years since quitting:  11.9   Smokeless tobacco: Former    Types: Associate Professor status: Never Used  Substance and Sexual Activity   Alcohol  use: Yes    Alcohol /week: 4.0 standard drinks of alcohol     Types: 4 Cans of beer per week    Comment: 4 beers a night previously   Drug use: Not Currently    Types: Marijuana    Comment: daily; quit using crack and methamphetamine about 5 months ago    Sexual activity: Not Currently    Partners: Female  Other Topics Concern   Not on file  Social History Narrative   Lives alone near [place]   Social Drivers of Health   Financial Resource Strain: Low Risk  (12/25/2020)   Overall Financial Resource Strain (CARDIA)    Difficulty of Paying Living Expenses: Not very hard  Recent Concern: Financial Resource Strain - High Risk (10/29/2020)   Overall Financial Resource Strain (CARDIA)    Difficulty of Paying Living Expenses: Hard  Food Insecurity: No Food Insecurity (11/12/2023)   Hunger Vital Sign    Worried About Running Out of Food in the Last Year: Never true    Ran Out of Food in the Last Year: Never true  Transportation Needs: Unmet Transportation Needs  (11/12/2023)   PRAPARE - Transportation    Lack of Transportation (Medical): Yes    Lack of Transportation (Non-Medical): Yes  Physical Activity: Inactive (12/11/2020)   Exercise Vital Sign    Days of Exercise per Week: 0 days    Minutes of Exercise per Session: 0 min  Stress: No Stress Concern Present (12/25/2020)   [name] of Occupational Health - Occupational Stress Questionnaire    Feeling of Stress : Not at all  Social Connections: Moderately Integrated (11/12/2023)   Social Connection and Isolation Panel    Frequency of Communication with Friends and Family: More than three times a week    Frequency of Social Gatherings with Friends and Family: Once a week    Attends Religious Services: 1 to 4 times per year    Active Member of [employer] or Organizations: Yes    Attends Banker Meetings: 1 to 4 times per year    Marital Status: Divorced  Intimate Partner Violence: Not At Risk (11/12/2023)   Humiliation, Afraid, Rape, and Kick questionnaire    Fear of Current or Ex-Partner: No    Emotionally Abused: No    Physically Abused: No    Sexually Abused: No      Social history: quit smoking about 11 years ago. His smoking use included cigarettes. He started smoking about 51 years ago. He has a 60 pack-year smoking history. He has quit using smokeless tobacco. His smokeless tobacco use included chew. He reports current alcohol  use of about 4.0 standard drinks of alcohol  per week. He reports that he does not currently use drugs after having used the following drugs: Marijuana.   Tobacco Cessation: Former smoker with a 60-pack-year smoking history quit 2024  Objective:  There were no vitals taken for this visit. {Pulm Vitals (Optional):32837}  Physical Exam  Diagnostic Review:  {Labs (Optional):32838}    CT chest 03/21/23 Stable 2 mm right lower lobe pulmonary nodule consistent with benign etiology. No followup imaging is recommended. No new suspicious pulmonary  nodule or mass.  Emphysema (ICD10-J43.9) and Aortic Atherosclerosis  Assessment & Plan:   Assessment & Plan Stage 3 severe COPD by GOLD classification (HCC)      Assessment/Plan Pulmonary nodules  in a former smoker Plan CT chest  due 02/2023 You will get a call to get this scheduled.  We will then do a telephone visit call to go over results Call to be seen sooner for blood in sputum or weight loss that you cannot explain     Stage III COPD per PFTs Last flare December 2024  On acute COPD exacerbation, LLL pneumonia, chornic hypoxia 4L [place] No follow-ups on file.    Marny Patch, MD Pulmonary and Critical Care Medicine [facility]

## 2023-11-28 ENCOUNTER — Encounter

## 2023-11-28 DIAGNOSIS — J449 Chronic obstructive pulmonary disease, unspecified: Secondary | ICD-10-CM

## 2023-12-05 DIAGNOSIS — J9621 Acute and chronic respiratory failure with hypoxia: Secondary | ICD-10-CM | POA: Diagnosis not present

## 2023-12-05 DIAGNOSIS — F79 Unspecified intellectual disabilities: Secondary | ICD-10-CM | POA: Diagnosis not present

## 2023-12-05 DIAGNOSIS — J9601 Acute respiratory failure with hypoxia: Secondary | ICD-10-CM | POA: Diagnosis not present

## 2023-12-19 ENCOUNTER — Other Ambulatory Visit: Payer: Self-pay

## 2023-12-19 ENCOUNTER — Other Ambulatory Visit: Payer: Self-pay | Admitting: Internal Medicine

## 2023-12-19 ENCOUNTER — Other Ambulatory Visit (HOSPITAL_COMMUNITY): Payer: Self-pay

## 2023-12-19 DIAGNOSIS — J9611 Chronic respiratory failure with hypoxia: Secondary | ICD-10-CM

## 2023-12-19 MED ORDER — ALBUTEROL SULFATE HFA 108 (90 BASE) MCG/ACT IN AERS
2.0000 | INHALATION_SPRAY | Freq: Four times a day (QID) | RESPIRATORY_TRACT | 0 refills | Status: DC | PRN
  Filled 2023-12-19: qty 20.1, 75d supply, fill #0

## 2023-12-20 ENCOUNTER — Other Ambulatory Visit: Payer: Self-pay

## 2023-12-20 ENCOUNTER — Other Ambulatory Visit (HOSPITAL_COMMUNITY): Payer: Self-pay

## 2023-12-20 ENCOUNTER — Other Ambulatory Visit: Payer: Self-pay | Admitting: Internal Medicine

## 2023-12-20 ENCOUNTER — Telehealth: Payer: Self-pay | Admitting: Internal Medicine

## 2023-12-20 NOTE — Telephone Encounter (Signed)
 Copied from CRM #[id number]. Topic: Clinical - Prescription Issue >> Dec 20, 2023  4:24 PM Delon DASEN wrote:  Reason for CRM: pregabalin  (LYRICA ) 100 MG capsule - need a new script to transfer to another pharmacy- send to [phone]

## 2023-12-21 ENCOUNTER — Other Ambulatory Visit: Payer: Self-pay

## 2023-12-21 ENCOUNTER — Other Ambulatory Visit (HOSPITAL_COMMUNITY): Payer: Self-pay

## 2023-12-21 MED ORDER — PREGABALIN 100 MG PO CAPS
100.0000 mg | ORAL_CAPSULE | Freq: Two times a day (BID) | ORAL | 5 refills | Status: DC
  Filled 2023-12-21: qty 60, 30d supply, fill #0

## 2023-12-21 NOTE — Telephone Encounter (Signed)
 Called & spoke to the patient. Verified name & DOB. Informed that the prescription was sent to [facility] pharmacy. Patient stated that he received a call from the pharmacy already and a delivery of his medication will be made today 12/21/23. No further assistance needed at this time. Patient expressed verbal understanding of all discussed.

## 2023-12-21 NOTE — Telephone Encounter (Signed)
 Thanks for the info.  I just accidentally sent RF to our pharmacy. Please cancel. I sent it again to [facility] this time.

## 2023-12-21 NOTE — Addendum Note (Signed)
 Addended by: VICCI SOBER B on: 12/21/2023 11:24 AM   Modules accepted: Orders

## 2023-12-21 NOTE — Telephone Encounter (Signed)
 Still has refills left on Lyrica  at our pharmacy. If he wants the remaining refills transferred to [facility], he can call our pharmacy and let them know. I have cc Herlene on this message to see if he can facilitate.

## 2023-12-27 ENCOUNTER — Other Ambulatory Visit: Payer: Self-pay

## 2023-12-27 ENCOUNTER — Other Ambulatory Visit: Payer: Self-pay | Admitting: Internal Medicine

## 2023-12-27 DIAGNOSIS — J449 Chronic obstructive pulmonary disease, unspecified: Secondary | ICD-10-CM

## 2023-12-27 MED ORDER — PREDNISONE 10 MG PO TABS
ORAL_TABLET | ORAL | 0 refills | Status: DC
  Filled 2023-12-27: qty 48, 30d supply, fill #0

## 2024-01-06 ENCOUNTER — Other Ambulatory Visit: Payer: Self-pay

## 2024-01-09 ENCOUNTER — Other Ambulatory Visit: Payer: Self-pay

## 2024-01-09 DIAGNOSIS — Z9981 Dependence on supplemental oxygen: Secondary | ICD-10-CM

## 2024-01-10 ENCOUNTER — Other Ambulatory Visit: Payer: Self-pay

## 2024-01-10 MED ORDER — ALBUTEROL SULFATE HFA 108 (90 BASE) MCG/ACT IN AERS
2.0000 | INHALATION_SPRAY | Freq: Four times a day (QID) | RESPIRATORY_TRACT | 0 refills | Status: AC | PRN
  Filled 2024-01-10: qty 20.1, 75d supply, fill #0

## 2024-01-10 NOTE — Telephone Encounter (Signed)
 Requested Prescriptions  Pending Prescriptions Disp Refills   albuterol  (VENTOLIN  HFA) 108 (90 Base) MCG/ACT inhaler 20.1 g 0    Sig: Inhale 2 puffs into the lungs every 6 (six) hours as needed for wheezing or shortness of breath.     Pulmonology:  Beta Agonists 2 Passed - 01/10/2024  2:40 PM      Passed - Last BP in normal range    BP Readings from Last 1 Encounters:  11/15/23 122/71         Passed - Last Heart Rate in normal range    Pulse Readings from Last 1 Encounters:  11/15/23 63         Passed - Valid encounter within last 12 months    Recent Outpatient Visits           2 months ago Type 2 diabetes mellitus with other specified complication, without long-term current use of insulin  (HCC)   [facility] [place] Vicci Sober B, MD   8 months ago Diabetes mellitus treated with oral medication [facility])   [facility] [place]. [facility] Vicci Sober B, MD   10 months ago Type 2 diabetes mellitus with diabetic mononeuropathy, without long-term current use of insulin  [facility])   [facility] [place]. [facility] [place], [place], [place]   1 year ago Type 2 diabetes mellitus with diabetic mononeuropathy, without long-term current use of insulin  [facility])   [facility] [place]. [facility] Vicci Sober B, MD   1 year ago Type 2 diabetes mellitus with diabetic mononeuropathy, without long-term current use of insulin  [facility])   [facility] [place]. [facility] Belvie BRAVO, MD

## 2024-01-11 ENCOUNTER — Inpatient Hospital Stay (HOSPITAL_COMMUNITY)
Admission: EM | Admit: 2024-01-11 | Discharge: 2024-01-15 | DRG: 871 | Disposition: A | Discharge status: Home / Self Care | Attending: Internal Medicine | Admitting: Internal Medicine

## 2024-01-11 ENCOUNTER — Emergency Department (HOSPITAL_COMMUNITY)

## 2024-01-11 ENCOUNTER — Encounter (HOSPITAL_COMMUNITY): Payer: Self-pay

## 2024-01-11 DIAGNOSIS — E1141 Type 2 diabetes mellitus with diabetic mononeuropathy: Secondary | ICD-10-CM | POA: Diagnosis not present

## 2024-01-11 DIAGNOSIS — Z8616 Personal history of COVID-19: Secondary | ICD-10-CM

## 2024-01-11 DIAGNOSIS — J440 Chronic obstructive pulmonary disease with acute lower respiratory infection: Secondary | ICD-10-CM | POA: Diagnosis present

## 2024-01-11 DIAGNOSIS — J441 Chronic obstructive pulmonary disease with (acute) exacerbation: Secondary | ICD-10-CM | POA: Diagnosis present

## 2024-01-11 DIAGNOSIS — R6520 Severe sepsis without septic shock: Secondary | ICD-10-CM | POA: Diagnosis not present

## 2024-01-11 DIAGNOSIS — F10139 Alcohol abuse with withdrawal, unspecified: Secondary | ICD-10-CM | POA: Diagnosis present

## 2024-01-11 DIAGNOSIS — Z79899 Other long term (current) drug therapy: Secondary | ICD-10-CM

## 2024-01-11 DIAGNOSIS — I252 Old myocardial infarction: Secondary | ICD-10-CM | POA: Diagnosis not present

## 2024-01-11 DIAGNOSIS — J9611 Chronic respiratory failure with hypoxia: Secondary | ICD-10-CM

## 2024-01-11 DIAGNOSIS — F79 Unspecified intellectual disabilities: Secondary | ICD-10-CM | POA: Diagnosis present

## 2024-01-11 DIAGNOSIS — Z7952 Long term (current) use of systemic steroids: Secondary | ICD-10-CM

## 2024-01-11 DIAGNOSIS — N179 Acute kidney failure, unspecified: Secondary | ICD-10-CM | POA: Diagnosis present

## 2024-01-11 DIAGNOSIS — I2510 Atherosclerotic heart disease of native coronary artery without angina pectoris: Secondary | ICD-10-CM | POA: Diagnosis present

## 2024-01-11 DIAGNOSIS — E119 Type 2 diabetes mellitus without complications: Secondary | ICD-10-CM | POA: Diagnosis present

## 2024-01-11 DIAGNOSIS — J449 Chronic obstructive pulmonary disease, unspecified: Secondary | ICD-10-CM | POA: Diagnosis present

## 2024-01-11 DIAGNOSIS — Z9981 Dependence on supplemental oxygen: Secondary | ICD-10-CM

## 2024-01-11 DIAGNOSIS — J9601 Acute respiratory failure with hypoxia: Secondary | ICD-10-CM

## 2024-01-11 DIAGNOSIS — Z8249 Family history of ischemic heart disease and other diseases of the circulatory system: Secondary | ICD-10-CM | POA: Diagnosis not present

## 2024-01-11 DIAGNOSIS — Y900 Blood alcohol level of less than 20 mg/100 ml: Secondary | ICD-10-CM | POA: Diagnosis present

## 2024-01-11 DIAGNOSIS — A419 Sepsis, unspecified organism: Secondary | ICD-10-CM | POA: Diagnosis present

## 2024-01-11 DIAGNOSIS — F1021 Alcohol dependence, in remission: Secondary | ICD-10-CM

## 2024-01-11 DIAGNOSIS — J9621 Acute and chronic respiratory failure with hypoxia: Secondary | ICD-10-CM | POA: Diagnosis present

## 2024-01-11 DIAGNOSIS — Z8701 Personal history of pneumonia (recurrent): Secondary | ICD-10-CM | POA: Diagnosis not present

## 2024-01-11 DIAGNOSIS — R509 Fever, unspecified: Secondary | ICD-10-CM

## 2024-01-11 DIAGNOSIS — Z7984 Long term (current) use of oral hypoglycemic drugs: Secondary | ICD-10-CM | POA: Diagnosis not present

## 2024-01-11 DIAGNOSIS — G9341 Metabolic encephalopathy: Secondary | ICD-10-CM | POA: Diagnosis present

## 2024-01-11 DIAGNOSIS — I110 Hypertensive heart disease with heart failure: Secondary | ICD-10-CM | POA: Diagnosis present

## 2024-01-11 DIAGNOSIS — Z7951 Long term (current) use of inhaled steroids: Secondary | ICD-10-CM | POA: Diagnosis not present

## 2024-01-11 DIAGNOSIS — Z1152 Encounter for screening for COVID-19: Secondary | ICD-10-CM | POA: Diagnosis not present

## 2024-01-11 DIAGNOSIS — J9620 Acute and chronic respiratory failure, unspecified whether with hypoxia or hypercapnia: Secondary | ICD-10-CM | POA: Diagnosis present

## 2024-01-11 DIAGNOSIS — I10 Essential (primary) hypertension: Secondary | ICD-10-CM | POA: Diagnosis not present

## 2024-01-11 DIAGNOSIS — R918 Other nonspecific abnormal finding of lung field: Secondary | ICD-10-CM | POA: Diagnosis present

## 2024-01-11 DIAGNOSIS — K219 Gastro-esophageal reflux disease without esophagitis: Secondary | ICD-10-CM | POA: Diagnosis present

## 2024-01-11 DIAGNOSIS — Z87891 Personal history of nicotine dependence: Secondary | ICD-10-CM | POA: Diagnosis not present

## 2024-01-11 DIAGNOSIS — Z902 Acquired absence of lung [part of]: Secondary | ICD-10-CM | POA: Diagnosis not present

## 2024-01-11 DIAGNOSIS — Z825 Family history of asthma and other chronic lower respiratory diseases: Secondary | ICD-10-CM

## 2024-01-11 LAB — I-STAT CHEM 8, ED
BUN: 44 mg/dL — ABNORMAL HIGH (ref 8–23)
Calcium, Ion: 1.13 mmol/L — ABNORMAL LOW (ref 1.15–1.40)
Chloride: 100 mmol/L (ref 98–111)
Creatinine, Ser: 1.5 mg/dL — ABNORMAL HIGH (ref 0.61–1.24)
Glucose, Bld: 117 mg/dL — ABNORMAL HIGH (ref 70–99)
HCT: 40 % (ref 39.0–52.0)
Hemoglobin: 13.6 g/dL (ref 13.0–17.0)
Potassium: 4.4 mmol/L (ref 3.5–5.1)
Sodium: 138 mmol/L (ref 135–145)
TCO2: 26 mmol/L (ref 22–32)

## 2024-01-11 LAB — COMPREHENSIVE METABOLIC PANEL WITH GFR
ALT: 21 U/L (ref 0–44)
AST: 25 U/L (ref 15–41)
Albumin: 3.9 g/dL (ref 3.5–5.0)
Alkaline Phosphatase: 53 U/L (ref 38–126)
Anion gap: 10 (ref 5–15)
BUN: 46 mg/dL — ABNORMAL HIGH (ref 8–23)
CO2: 28 mmol/L (ref 22–32)
Calcium: 9.3 mg/dL (ref 8.9–10.3)
Chloride: 99 mmol/L (ref 98–111)
Creatinine, Ser: 1.42 mg/dL — ABNORMAL HIGH (ref 0.61–1.24)
GFR, Estimated: 56 mL/min — ABNORMAL LOW
Glucose, Bld: 124 mg/dL — ABNORMAL HIGH (ref 70–99)
Potassium: 4.5 mmol/L (ref 3.5–5.1)
Sodium: 138 mmol/L (ref 135–145)
Total Bilirubin: 0.4 mg/dL (ref 0.0–1.2)
Total Protein: 6.7 g/dL (ref 6.5–8.1)

## 2024-01-11 LAB — ETHANOL: Alcohol, Ethyl (B): <15 mg/dL

## 2024-01-11 LAB — CBC WITH DIFFERENTIAL/PLATELET
Abs Immature Granulocytes: 0.04 K/uL (ref 0.00–0.07)
Basophils Absolute: 0 K/uL (ref 0.0–0.1)
Basophils Relative: 0 %
Eosinophils Absolute: 0 K/uL (ref 0.0–0.5)
Eosinophils Relative: 0 %
HCT: 39.6 % (ref 39.0–52.0)
Hemoglobin: 12.7 g/dL — ABNORMAL LOW (ref 13.0–17.0)
Immature Granulocytes: 0 %
Lymphocytes Relative: 12 %
Lymphs Abs: 1.3 K/uL (ref 0.7–4.0)
MCH: 30.8 pg (ref 26.0–34.0)
MCHC: 32.1 g/dL (ref 30.0–36.0)
MCV: 96.1 fL (ref 80.0–100.0)
Monocytes Absolute: 1.5 K/uL — ABNORMAL HIGH (ref 0.1–1.0)
Monocytes Relative: 13 %
Neutro Abs: 8.2 K/uL — ABNORMAL HIGH (ref 1.7–7.7)
Neutrophils Relative %: 75 %
Platelets: 280 K/uL (ref 150–400)
RBC: 4.12 MIL/uL — ABNORMAL LOW (ref 4.22–5.81)
RDW: 13.4 % (ref 11.5–15.5)
WBC: 11.1 K/uL — ABNORMAL HIGH (ref 4.0–10.5)
nRBC: 0 % (ref 0.0–0.2)

## 2024-01-11 LAB — I-STAT ARTERIAL BLOOD GAS, ED
Acid-Base Excess: 3 mmol/L — ABNORMAL HIGH (ref 0.0–2.0)
Bicarbonate: 28.4 mmol/L — ABNORMAL HIGH (ref 20.0–28.0)
Calcium, Ion: 1.21 mmol/L (ref 1.15–1.40)
HCT: 36 % — ABNORMAL LOW (ref 39.0–52.0)
Hemoglobin: 12.2 g/dL — ABNORMAL LOW (ref 13.0–17.0)
O2 Saturation: 99 %
Patient temperature: 104.5
Potassium: 4.3 mmol/L (ref 3.5–5.1)
Sodium: 137 mmol/L (ref 135–145)
TCO2: 30 mmol/L (ref 22–32)
pCO2 arterial: 53.2 mmHg — ABNORMAL HIGH (ref 32–48)
pH, Arterial: 7.35 (ref 7.35–7.45)
pO2, Arterial: 185 mmHg — ABNORMAL HIGH (ref 83–108)

## 2024-01-11 LAB — PROTIME-INR
INR: 1.1 (ref 0.8–1.2)
Prothrombin Time: 14.5 s (ref 11.4–15.2)

## 2024-01-11 LAB — I-STAT CG4 LACTIC ACID, ED
Lactic Acid, Venous: 1.2 mmol/L (ref 0.5–1.9)
Lactic Acid, Venous: 1.1 mmol/L (ref 0.5–1.9)

## 2024-01-11 LAB — RESP PANEL BY RT-PCR (RSV, FLU A&B, COVID)  RVPGX2
Influenza A by PCR: NEGATIVE
Influenza B by PCR: NEGATIVE
Resp Syncytial Virus by PCR: NEGATIVE
SARS Coronavirus 2 by RT PCR: NEGATIVE

## 2024-01-11 MED ORDER — MAGNESIUM SULFATE 2 GM/50ML IV SOLN
2.0000 g | Freq: Once | INTRAVENOUS | Status: AC
  Administered 2024-01-11: 2 g via INTRAVENOUS
  Filled 2024-01-11: qty 50

## 2024-01-11 MED ORDER — SODIUM CHLORIDE 0.9 % IV SOLN
1.0000 g | Freq: Once | INTRAVENOUS | Status: AC
  Administered 2024-01-11: 1 g via INTRAVENOUS
  Filled 2024-01-11: qty 10

## 2024-01-11 MED ORDER — SODIUM CHLORIDE 0.9 % IV SOLN
500.0000 mg | Freq: Once | INTRAVENOUS | Status: AC
  Administered 2024-01-12: 500 mg via INTRAVENOUS
  Filled 2024-01-11: qty 5

## 2024-01-11 MED ORDER — ACETAMINOPHEN 500 MG PO TABS: 1000.0000 mg | ORAL_TABLET | Freq: Once | ORAL | Status: DC

## 2024-01-11 MED ORDER — ALBUTEROL SULFATE (2.5 MG/3ML) 0.083% IN NEBU
10.0000 mg | INHALATION_SOLUTION | RESPIRATORY_TRACT | Status: DC
  Administered 2024-01-11: 10 mg via RESPIRATORY_TRACT
  Filled 2024-01-11: qty 12

## 2024-01-11 MED ORDER — ACETAMINOPHEN 650 MG RE SUPP
650.0000 mg | Freq: Once | RECTAL | Status: AC
  Administered 2024-01-11: 650 mg via RECTAL
  Filled 2024-01-11: qty 1

## 2024-01-11 NOTE — Progress Notes (Signed)
 RT NOTE:   Pt titrated from BIPAP to 3L [place] per MD request. Pt tolerating well at this time.

## 2024-01-11 NOTE — ED Provider Notes (Signed)
 Received patient in turnover from Dr. Lenor.  Please see their note for further details of Hx, PE.  Briefly patient is a 64 y.o. male with a Respiratory Distress .  Cough congestion fever going on for a couple days.  Arrived on Bipap, has had improvement with nebs, steroids.  Started on abx.  Awaiting admission.    Emil Share, DO 01/11/24 2354

## 2024-01-11 NOTE — H&P (Incomplete)
 " History and Physical    [name] FMW:[id number] DOB: 06/11/1960 DOA: 01/11/2024 DOS: the patient was seen and examined on 01/11/2024 PCP: Vicci Barnie NOVAK, MD  Patient coming from: Home  Chief Complaint:  Chief Complaint  Patient presents with   Respiratory Distress   HPI: James [name] is a 64 y.o. male with medical history significant of COPD, chronic respiratory failure with foxy on oxygen , diabetes, essential hypertension, coronary artery disease, remote history of alcohol  abuse who presented to the ER with significant shortness of breath cough and acute hypoxic respiratory failure requiring BiPAP. Review of Systems: {ROS_Text:26778} Past Medical History:  Diagnosis Date   Acute on chronic respiratory failure with hypoxia (HCC) 06/08/2013   Acute on chronic respiratory failure with hypoxia and hypercapnia (HCC) 06/08/2013   AKI (acute kidney injury) 07/12/2022   Alcohol  use disorder 11/23/2018   CAP (community acquired pneumonia) 05/17/2018   See admit 05/17/18 ? rml  with   covid pcr neg - rx augmentin  > f/u cxr in 4-6 weeks is fine unless condition declines    Community acquired pneumonia 07/12/2021   Community acquired pneumonia of right lower lobe of lung 05/17/2018   See admit 05/17/18 ? 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   Recurrent pneumonia 02/20/2021   Tick bite 06/03/2018   Past Surgical History:  Procedure Laterality Date   ESOPHAGEAL MANOMETRY N/A 09/15/2022    Procedure: ESOPHAGEAL MANOMETRY (EM);  Surgeon: Abran Norleen SAILOR, MD;  Location: [facility];  Service: Gastroenterology;  Laterality: N/A;   LEFT HEART CATH AND CORONARY ANGIOGRAPHY N/A 12/07/2021   Procedure: LEFT HEART CATH AND CORONARY ANGIOGRAPHY;  Surgeon: Court Dorn PARAS, MD;  [facility];  Service: Cardiovascular;  Laterality: N/A;   LUNG REMOVAL, PARTIAL     VIDEO ASSISTED THORACOSCOPY (VATS)/THOROCOTOMY Left 06/11/2013   Procedure: VIDEO ASSISTED THORACOSCOPY (VATS)/THOROCOTOMY;  Surgeon: Maude Fleeta Ochoa, MD;  Location: [facility] OR;  Service: Thoracic;  Laterality: Left;   VIDEO BRONCHOSCOPY N/A 06/11/2013   Procedure: VIDEO BRONCHOSCOPY;  Surgeon: Maude Fleeta Ochoa, MD;  Location: [facility] OR;  Service: Thoracic;  Laterality: N/A;   VIDEO BRONCHOSCOPY N/A 08/10/2018   Procedure: VIDEO BRONCHOSCOPY WITH FLUORO;  Surgeon: Claudene Toribio BROCKS, MD;  Location: [facility] ENDOSCOPY;  Service: Endoscopy;  Laterality: N/A;   Social History:  reports that he quit smoking about 12 years ago. His smoking use included cigarettes. He started smoking about 52 years ago. He has a 60 pack-year smoking history. He has quit using smokeless tobacco.  His smokeless tobacco use included chew. He reports current alcohol  use of about 4.0 standard drinks of alcohol  per week. He reports that he does not currently use drugs after having used the following drugs: Marijuana.  Allergies[1]  Family History  Problem Relation Age of Onset   Heart disease Father    COPD Sister     Prior to Admission medications  Medication Sig Start Date End Date Taking? Authorizing Provider  Accu-Chek Softclix  Lancets lancets Use to check blood sugar once daily. 10/18/23   Vicci Barnie NOVAK, MD  albuterol  (PROVENTIL ) (2.5 MG/3ML) 0.083% nebulizer solution Inhale 3 mLs (2.5 mg total) by nebulization every 4 (four) hours as needed for wheezing or shortness of breath. Patient taking differently: Take 2.5 mg by nebulization daily  as needed for wheezing or shortness of breath. 03/03/23   Danton Jon HERO, PA-C  albuterol  (VENTOLIN  HFA) 108 (90 Base) MCG/ACT inhaler Inhale 2 puffs into the lungs every 6 (six) hours as needed for wheezing or shortness of breath. 01/10/24   Vicci Barnie NOVAK, MD  atorvastatin  (LIPITOR) 10 MG tablet Take 1 tablet (10 mg total) by mouth daily. 04/28/23   Vicci Barnie NOVAK, MD  budesonide -glycopyrrolate -formoterol  (BREZTRI  AEROSPHERE) 160-9-4.8 MCG/ACT AERO inhaler Inhale 2 puffs into the lungs in the morning and at bedtime. 11/16/23   Vicci Barnie NOVAK, MD  folic acid  (FOLVITE ) 1 MG tablet Take 1 tablet (1 mg total) by mouth daily. 10/18/23   Vicci Barnie NOVAK, MD  furosemide  (LASIX ) 40 MG tablet Take 1 tablet (40 mg total) by mouth daily. 10/18/23   Vicci Barnie NOVAK, MD  glipiZIDE  (GLUCOTROL  XL) 2.5 MG 24 hr tablet Take 1 tablet (2.5 mg total) by mouth daily with breakfast. 10/18/23   Vicci Barnie NOVAK, MD  glucose blood (ACCU-CHEK GUIDE TEST) test strip Use to check blood sugar once daily. 10/18/23   Vicci Barnie NOVAK, MD  losartan  (COZAAR ) 100 MG tablet Take 1 tablet (100 mg total) by mouth daily. 03/03/23   Danton Jon HERO, PA-C  metoprolol  succinate (TOPROL -XL) 50 MG 24 hr tablet Take 1 tablet (50 mg total) by mouth daily. Take with or immediately following a meal. 06/21/23   Vicci Barnie NOVAK, MD  nystatin  ointment (MYCOSTATIN ) Apply 1 Application topically 2 (two) times daily. Mix with triamcinolone . Use for 2 weeks. Patient taking differently: Apply 1 Application topically daily. Mix with triamcinolone . 10/18/23   Vicci Barnie NOVAK, MD  pantoprazole  (PROTONIX ) 40 MG tablet Take 1 tablet (40 mg total) by mouth daily. 10/18/23   Vicci Barnie NOVAK, MD  predniSONE  (DELTASONE ) 10 MG tablet Take 4 tablets (40 mg total) by mouth daily with breakfast for 3 days, THEN 3 tablets (30 mg total) daily with breakfast for 3 days, THEN 2 tablets (20 mg total) daily with breakfast for 3 days, THEN 1  tablet (10 mg total) daily with breakfast for 21 days. 12/27/23 02/08/24  Newlin, Enobong, MD  pregabalin  (LYRICA ) 100 MG capsule Take 1 capsule (100 mg total) by mouth 2 (two) times daily. 06/21/23   Vicci Barnie NOVAK, MD  pregabalin  (LYRICA ) 100 MG capsule Take 1 capsule (100 mg total) by mouth 2 (two) times daily. 12/21/23   Vicci Barnie NOVAK, MD  triamcinolone  (KENALOG ) 0.025 % ointment Apply 1 Application topically 2 (two) times daily. Mix with nystatin . Use for 2 weeks. Patient not taking: No sig reported 04/20/23   Alm Delon SAILOR, DO    Physical Exam: Vitals:   01/11/24 2115 01/11/24 2200 01/11/24 2230 01/11/24 2300  BP: 130/72 125/69 116/74 104/62  Pulse: (!) 133 (!) 138 (!) 126 (!) 117  Resp: (!) 23 (!) 27 (!) 28 20  Temp:    99.9 F (37.7 C)  TempSrc:    Axillary  SpO2: 97% 97% 100% 98%  Weight:      Height:       *** Data Reviewed: {Tip this will not be part of the note when signed- Document your independent interpretation  of telemetry tracing, EKG, lab, Radiology test or any other diagnostic tests. Add any new diagnostic test ordered today. (Optional):26781} {Results:26384}  Assessment and Plan: No notes have been filed under this hospital service. Service: Hospitalist     Advance Care Planning:   Code Status: Prior ***  Consults: ***  Family Communication: ***  Severity of Illness: {Observation/Inpatient:21159}  AuthorBETHA SIM KNOLL, MD 01/11/2024 11:44 PM  For on call review [email or web address].       [1] No Known Allergies "

## 2024-01-11 NOTE — ED Triage Notes (Signed)
 Pt BIB [facility] from home d/t Respiratory distress.  Pt was 90% on 8L [place] and now on CPAP @ 96%.  Fever of 102.4 temporal 125 IV Solumedrol given and 2 Duonebs.  IV  R wrist In and out of [place]  BP 122/77 HR 140

## 2024-01-11 NOTE — ED Provider Notes (Addendum)
 " [facility] Provider Note   CSN: [id number] Arrival date & time: 01/11/24  2036     Patient presents with: Respiratory Distress   James [name] is a 64 y.o. male.   Patient is a 64 year old male with a history of COPD, CHF, chronic hypoxia with oxygen  at home at 4 L/min, hypertension, diabetes.  He has been admitted for recurrent pneumonia and COPD exacerbations in the past.  He comes in in respiratory distress.  He indicates that he started having trouble breathing this morning.  He is noted to be febrile but does not know when it started.  He does report some congested cough.  No chest pain.  History is limited due to his shortness of breath.  EMS gave 2 DuoNeb's, 125 of Solu-Medrol  and placed him on CPAP with some improvement in symptoms.       Prior to Admission medications  Medication Sig Start Date End Date Taking? Authorizing Provider  Accu-Chek Softclix Lancets lancets Use to check blood sugar once daily. 10/18/23   Vicci Barnie NOVAK, MD  albuterol  (PROVENTIL ) (2.5 MG/3ML) 0.083% nebulizer solution Inhale 3 mLs (2.5 mg total) by nebulization every 4 (four) hours as needed for wheezing or shortness of breath. Patient taking differently: Take 2.5 mg by nebulization daily as needed for wheezing or shortness of breath. 03/03/23   McClung, Angela M, PA-C  albuterol  (VENTOLIN  HFA) 108 (90 Base) MCG/ACT inhaler Inhale 2 puffs into the lungs every 6 (six) hours as needed for wheezing or shortness of breath. 01/10/24   Vicci Barnie NOVAK, MD  atorvastatin  (LIPITOR) 10 MG tablet Take 1 tablet (10 mg total) by mouth daily. 04/28/23   Vicci Barnie NOVAK, MD  budesonide -glycopyrrolate -formoterol  (BREZTRI  AEROSPHERE) 160-9-4.8 MCG/ACT AERO inhaler Inhale 2 puffs into the lungs in the morning and at bedtime. 11/16/23   Vicci Barnie NOVAK, MD  folic acid  (FOLVITE ) 1 MG tablet Take 1 tablet (1 mg total) by mouth daily. 10/18/23   Vicci Barnie NOVAK, MD   furosemide  (LASIX ) 40 MG tablet Take 1 tablet (40 mg total) by mouth daily. 10/18/23   Vicci Barnie NOVAK, MD  glipiZIDE  (GLUCOTROL  XL) 2.5 MG 24 hr tablet Take 1 tablet (2.5 mg total) by mouth daily with breakfast. 10/18/23   Vicci Barnie NOVAK, MD  glucose blood (ACCU-CHEK GUIDE TEST) test strip Use to check blood sugar once daily. 10/18/23   Vicci Barnie NOVAK, MD  losartan  (COZAAR ) 100 MG tablet Take 1 tablet (100 mg total) by mouth daily. 03/03/23   Danton Jon HERO, PA-C  metoprolol  succinate (TOPROL -XL) 50 MG 24 hr tablet Take 1 tablet (50 mg total) by mouth daily. Take with or immediately following a meal. 06/21/23   Vicci Barnie NOVAK, MD  nystatin  ointment (MYCOSTATIN ) Apply 1 Application topically 2 (two) times daily. Mix with triamcinolone . Use for 2 weeks. Patient taking differently: Apply 1 Application topically daily. Mix with triamcinolone . 10/18/23   Vicci Barnie NOVAK, MD  pantoprazole  (PROTONIX ) 40 MG tablet Take 1 tablet (40 mg total) by mouth daily. 10/18/23   Vicci Barnie NOVAK, MD  predniSONE  (DELTASONE ) 10 MG tablet Take 4 tablets (40 mg total) by mouth daily with breakfast for 3 days, THEN 3 tablets (30 mg total) daily with breakfast for 3 days, THEN 2 tablets (20 mg total) daily with breakfast for 3 days, THEN 1 tablet (10 mg total) daily with breakfast for 21 days. 12/27/23 02/08/24  Newlin, Enobong, MD  pregabalin  (LYRICA ) 100 MG capsule  Take 1 capsule (100 mg total) by mouth 2 (two) times daily. 06/21/23   Vicci Barnie NOVAK, MD  pregabalin  (LYRICA ) 100 MG capsule Take 1 capsule (100 mg total) by mouth 2 (two) times daily. 12/21/23   Vicci Barnie NOVAK, MD  triamcinolone  (KENALOG ) 0.025 % ointment Apply 1 Application topically 2 (two) times daily. Mix with nystatin . Use for 2 weeks. Patient not taking: No sig reported 04/20/23   Alm Delon SAILOR, DO    Allergies: Patient has no known allergies.    Review of Systems  Constitutional:  Positive for fatigue.  HENT:  Positive  for congestion.   Respiratory:  Positive for cough, shortness of breath and wheezing.   Cardiovascular:  Negative for chest pain.  Gastrointestinal:  Negative for vomiting.    Updated Vital Signs BP 104/62   Pulse (!) 117   Temp 99.9 F (37.7 C) (Axillary)   Resp 20   Ht 5' 9 (1.753 m)   Wt 84.5 kg   SpO2 98%   BMI 27.51 kg/m   Physical Exam Constitutional:      General: He is in acute distress.     Appearance: He is well-developed.  HENT:     Head: Normocephalic and atraumatic.  Eyes:     Pupils: Pupils are equal, round, and reactive to light.  Cardiovascular:     Rate and Rhythm: Normal rate and regular rhythm.     Heart sounds: Normal heart sounds.  Pulmonary:     Effort: Respiratory distress present.     Breath sounds: Wheezing present. No rales.     Comments: Diminished breath sounds with increased work of breathing and tachypnea Chest:     Chest wall: No tenderness.  Abdominal:     General: Bowel sounds are normal.     Palpations: Abdomen is soft.     Tenderness: There is no abdominal tenderness. There is no guarding or rebound.  Musculoskeletal:        General: Normal range of motion.     Cervical back: Normal range of motion and neck supple.     Comments: No edema or calf tenderness  Lymphadenopathy:     Cervical: No cervical adenopathy.  Skin:    General: Skin is warm and dry.     Findings: No rash.  Neurological:     Mental Status: He is alert and oriented to person, place, and time.     (all labs ordered are listed, but only abnormal results are displayed) Labs Reviewed  COMPREHENSIVE METABOLIC PANEL WITH GFR - Abnormal; Notable for the following components:      Result Value   Glucose, Bld 124 (*)    BUN 46 (*)    Creatinine, Ser 1.42 (*)    GFR, Estimated 56 (*)    All other components within normal limits  CBC WITH DIFFERENTIAL/PLATELET - Abnormal; Notable for the following components:   WBC 11.1 (*)    RBC 4.12 (*)    Hemoglobin 12.7  (*)    Neutro Abs 8.2 (*)    Monocytes Absolute 1.5 (*)    All other components within normal limits  I-STAT CHEM 8, ED - Abnormal; Notable for the following components:   BUN 44 (*)    Creatinine, Ser 1.50 (*)    Glucose, Bld 117 (*)    Calcium , Ion 1.13 (*)    All other components within normal limits  I-STAT ARTERIAL BLOOD GAS, ED - Abnormal; Notable for the following components:   pCO2 arterial  53.2 (*)    pO2, Arterial 185 (*)    Bicarbonate 28.4 (*)    Acid-Base Excess 3.0 (*)    HCT 36.0 (*)    Hemoglobin 12.2 (*)    All other components within normal limits  CULTURE, BLOOD (ROUTINE X 2)  CULTURE, BLOOD (ROUTINE X 2)  RESP PANEL BY RT-PCR (RSV, FLU A&B, COVID)  RVPGX2  PROTIME-INR  ETHANOL  URINALYSIS, W/ REFLEX TO CULTURE (INFECTION SUSPECTED)  I-STAT CG4 LACTIC ACID, ED  I-STAT CG4 LACTIC ACID, ED    EKG: EKG Interpretation Date/Time:  Wednesday January 11 2024 21:03:59 EST Ventricular Rate:  137 PR Interval:  161 QRS Duration:  100 QT Interval:  291 QTC Calculation: 440 R Axis:   51  Text Interpretation: Sinus tachycardia Paired ventricular premature complexes Confirmed by Lenor Hollering ([id number]) on 01/11/2024 9:25:24 PM  Radiology: [facility] Chest Port 1 View Result Date: 01/11/2024 EXAM: 1 VIEW(S) XRAY OF THE CHEST 01/11/2024 08:59:00 PM COMPARISON: Chest AP portable 11/12/2023. CLINICAL HISTORY: Questionable sepsis - evaluate for abnormality. FINDINGS: LUNGS AND PLEURA: There is a low inspiration. Limited view of the bases. The visualized lungs are clear, but basilar disease could be missed. No pleural effusion. No pneumothorax. HEART AND MEDIASTINUM: There is aortic atherosclerosis with stable mediastinum. The cardiac size is normal. BONES AND SOFT TISSUES: There are multiple chronic healed fracture deformities of the left greater than right ribs. No acute osseous abnormality. IMPRESSION: 1. No acute cardiopulmonary  findings. 2. Limited evaluation of the bases due to low  inspiration. Consider PA and lateral study in full inspiration if clinically warranted. Electronically signed by: Francis Quam MD 01/11/2024 09:12 PM EST RP Workstation: [id number]     Procedures   Medications Ordered in the ED  magnesium  sulfate IVPB 2 g 50 mL (0 g Intravenous Stopped 01/11/24 2216)  acetaminophen  (TYLENOL ) suppository 650 mg (650 mg Rectal Given 01/11/24 2113)                                    Medical Decision Making Amount and/or Complexity of Data Reviewed Labs: ordered. Radiology: ordered.  Risk OTC drugs. Prescription drug management.   This patient presents to the ED for concern of shortness of breath, this involves an extensive number of treatment options, and is a complaint that carries with it a high risk of complications and morbidity.  I considered the following differential and admission for this acute, potentially life threatening condition.  The differential diagnosis includes COPD exacerbation, CHF exacerbation, pneumonia, sepsis, aspiration, PE, viral infection  MDM:    Patient is a 64 year old who presents with respiratory distress.  He was placed on BiPAP.  He is noted to be febrile with a temperature of 100.7.  It was checked rectally and was 104.5.  He was given a rectal suppository.  He was given continuous albuterol  neb through the BiPAP.  He was given magnesium .  He had been given Solu-Medrol  by EMS.  Chest x-ray does not show any acute abnormality.  No evidence of obvious pneumonia.  His WBC count is mildly elevated at 11.  Lactic acid is normal.  ABG does not show any significant retention.  On recheck he is doing much better.  Will try him off BiPAP.  He reports he has had a cough for couple of days with some myalgias and fever starting yesterday.  Given that he does not have any obvious pneumonia on x-ray,  we will wait for his viral panel to come back prior to starting antibiotics.  Will need admission after this.   Viral panel is negative  for COVID/flu/RSV.  Will start Rocephin  and Zithromax  for presumed pneumonia.  Will consult the hospitalist for admission.  CRITICAL CARE Performed by: Andrea Ness Total critical care time: 80 minutes Critical care time was exclusive of separately billable procedures and treating other patients. Critical care was necessary to treat or prevent imminent or life-threatening deterioration. Critical care was time spent personally by me on the following activities: development of treatment plan with patient and/or surrogate as well as nursing, discussions with consultants, evaluation of patient's response to treatment, examination of patient, obtaining history from patient or surrogate, ordering and performing treatments and interventions, ordering and review of laboratory studies, ordering and review of radiographic studies, pulse oximetry and re-evaluation of patient's condition.   (Labs, imaging, consults)  Labs: I Ordered, and personally interpreted labs.  The pertinent results include: Elevated WBC count, mildly elevated creatinine, lactic acid normal  Imaging Studies ordered: I ordered imaging studies including chest x-ray I independently visualized and interpreted imaging. I agree with the radiologist interpretation  Additional history obtained from EMS.  External records from outside source obtained and reviewed including prior notes  Cardiac Monitoring: The patient was maintained on a cardiac monitor.  If on the cardiac monitor, I personally viewed and interpreted the cardiac monitored which showed an underlying rhythm of: Sinus tachycardia  Reevaluation: After the interventions noted above, I reevaluated the patient and found that they have :improved  Social Determinants of Health:  alcohol  use  Disposition: Admit to hospital  Co morbidities that complicate the patient evaluation  Past Medical History:  Diagnosis Date   Acute on chronic respiratory failure with hypoxia  (HCC) 06/08/2013   Acute on chronic respiratory failure with hypoxia and hypercapnia (HCC) 06/08/2013   AKI (acute kidney injury) 07/12/2022   Alcohol  use disorder 11/23/2018   CAP (community acquired pneumonia) 05/17/2018   See admit 05/17/18 ? rml  with   covid pcr neg - rx augmentin  > f/u cxr in 4-6 weeks is fine unless condition declines    Community acquired pneumonia 07/12/2021   Community acquired pneumonia of right lower lobe of lung 05/17/2018   See admit 05/17/18 ? 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   Recurrent pneumonia 02/20/2021   Tick bite 06/03/2018     Medicines Meds ordered this encounter  Medications   DISCONTD: acetaminophen  (TYLENOL ) tablet 1,000 mg   DISCONTD: albuterol  (PROVENTIL ) (2.5 MG/3ML) 0.083% nebulizer solution 10 mg   magnesium  sulfate IVPB 2 g 50 mL   acetaminophen  (TYLENOL ) suppository 650 mg    I have reviewed the patients home medicines and have made adjustments as needed  Problem List / ED Course: Problem List Items Addressed This Visit       Respiratory   COPD exacerbation (HCC) - Primary   Other Visit Diagnoses       Febrile illness  Final diagnoses:  COPD exacerbation [facility])  Febrile illness    ED Discharge Orders     None          Lenor Hollering, MD 01/11/24 2316    Lenor Hollering, MD 01/11/24 7668  "

## 2024-01-11 NOTE — H&P (Signed)
 " History and Physical    [name] FMW:[id number] DOB: [birth year] DOA: 01/11/2024 DOS: the patient was seen and examined on 01/11/2024 PCP: Vicci Barnie NOVAK, MD  Patient coming from: Home  Chief Complaint:  Chief Complaint  Patient presents with   Respiratory Distress   HPI: James [name] is a 64 y.o. male with medical history significant of COPD, chronic respiratory failure with foxy on oxygen , diabetes, essential hypertension, coronary artery disease, remote history of alcohol  abuse who presented to the ER with significant shortness of breath cough and acute hypoxic respiratory failure requiring BiPAP.  Patient was noted to have significant elevated temperature.  Initially elevated at 104.5, leukocytosis and tachycardia.  He meets sepsis criteria but no obvious source.  Suspected to be early pneumonia with a COPD exacerbation.  Patient has now been titrated off BiPAP to 4 L/min.  He is confused obtunded unable to give more history.  No evidence of acute viral syndrome.  Will admitted with sepsis of unknown source.  Review of Systems: As mentioned in the history of present illness. All other systems reviewed and are negative. Past Medical History:  Diagnosis Date   Acute on chronic respiratory failure with hypoxia (HCC) 06/08/2013   Acute on chronic respiratory failure with hypoxia and hypercapnia (HCC) 06/08/2013   AKI (acute kidney injury) 07/12/2022   Alcohol  use disorder 11/23/2018   CAP (community acquired pneumonia) 05/17/2018   See admit 05/17/18 ? rml  with   covid pcr neg - rx augmentin  > f/u cxr in 4-6 weeks is fine unless condition declines    Community acquired pneumonia 07/12/2021   Community acquired pneumonia of right lower lobe of lung 05/17/2018   See admit 05/17/18 ? 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   Recurrent pneumonia 02/20/2021   Tick bite 06/03/2018   Past Surgical History:  Procedure Laterality Date   ESOPHAGEAL MANOMETRY N/A 09/15/2022   Procedure: ESOPHAGEAL MANOMETRY (EM);  Surgeon: Abran Norleen SAILOR, MD;  Location: [facility];  Service: Gastroenterology;  Laterality: N/A;   LEFT HEART CATH AND CORONARY ANGIOGRAPHY N/A 12/07/2021   Procedure: LEFT HEART CATH AND CORONARY ANGIOGRAPHY;  Surgeon: Court Dorn PARAS, MD;  Location: [facility];  Service: Cardiovascular;  Laterality: N/A;   LUNG REMOVAL, PARTIAL     VIDEO ASSISTED THORACOSCOPY (VATS)/THOROCOTOMY Left 06/11/2013   Procedure: VIDEO ASSISTED THORACOSCOPY (VATS)/THOROCOTOMY;  Surgeon: Maude Fleeta Ochoa, MD;  Location: [facility] OR;  Service: Thoracic;  Laterality: Left;   VIDEO BRONCHOSCOPY N/A 06/11/2013   Procedure: VIDEO BRONCHOSCOPY;  Surgeon: Maude Fleeta Ochoa, MD;  Location: [facility] OR;  Service: Thoracic;  Laterality: N/A;   VIDEO BRONCHOSCOPY N/A 08/10/2018   Procedure: VIDEO BRONCHOSCOPY WITH FLUORO;  Surgeon: Claudene Toribio BROCKS, MD;  Location: [facility]COPY;  Service: Endoscopy;  Laterality: N/A;   Social History:  reports that he quit smoking about 12 years ago. His smoking use included cigarettes. He started smoking about 52 years ago. He has a 60 pack-year  smoking history. He has quit using smokeless tobacco.  His smokeless tobacco use included chew. He reports current alcohol  use of about 4.0 standard drinks of alcohol  per week. He reports that he does not currently use drugs after having used the following drugs: Marijuana.  Allergies[1]  Family History  Problem Relation Age of Onset   Heart disease  Father    COPD Sister     Prior to Admission medications  Medication Sig Start Date End Date Taking? Authorizing Provider  Accu-Chek Softclix Lancets lancets Use to check blood sugar once daily. 10/18/23   Vicci Barnie NOVAK, MD  albuterol  (PROVENTIL ) (2.5 MG/3ML) 0.083% nebulizer solution Inhale 3 mLs (2.5 mg total) by nebulization every 4 (four) hours as needed for wheezing or shortness of breath. Patient taking differently: Take 2.5 mg by nebulization daily as needed for wheezing or shortness of breath. 03/03/23   Danton Jon HERO, PA-C  albuterol  (VENTOLIN  HFA) 108 (90 Base) MCG/ACT inhaler Inhale 2 puffs into the lungs every 6 (six) hours as needed for wheezing or shortness of breath. 01/10/24   Vicci Barnie NOVAK, MD  atorvastatin  (LIPITOR) 10 MG tablet Take 1 tablet (10 mg total) by mouth daily. 04/28/23   Vicci Barnie NOVAK, MD  budesonide -glycopyrrolate -formoterol  (BREZTRI  AEROSPHERE) 160-9-4.8 MCG/ACT AERO inhaler Inhale 2 puffs into the lungs in the morning and at bedtime. 11/16/23   Vicci Barnie NOVAK, MD  folic acid  (FOLVITE ) 1 MG tablet Take 1 tablet (1 mg total) by mouth daily. 10/18/23   Vicci Barnie NOVAK, MD  furosemide  (LASIX ) 40 MG tablet Take 1 tablet (40 mg total) by mouth daily. 10/18/23   Vicci Barnie NOVAK, MD  glipiZIDE  (GLUCOTROL  XL) 2.5 MG 24 hr tablet Take 1 tablet (2.5 mg total) by mouth daily with breakfast. 10/18/23   Vicci Barnie NOVAK, MD  glucose blood (ACCU-CHEK GUIDE TEST) test strip Use to check blood sugar once daily. 10/18/23   Vicci Barnie NOVAK, MD  losartan  (COZAAR ) 100 MG tablet Take 1 tablet (100 mg total) by mouth daily. 03/03/23   Danton Jon HERO, PA-C  metoprolol  succinate (TOPROL -XL) 50 MG 24 hr tablet Take 1 tablet (50 mg total) by mouth daily. Take with or immediately following a meal. 06/21/23   Vicci Barnie NOVAK, MD  nystatin  ointment (MYCOSTATIN ) Apply 1 Application topically 2 (two) times daily. Mix with triamcinolone . Use for 2 weeks. Patient  taking differently: Apply 1 Application topically daily. Mix with triamcinolone . 10/18/23   Vicci Barnie NOVAK, MD  pantoprazole  (PROTONIX ) 40 MG tablet Take 1 tablet (40 mg total) by mouth daily. 10/18/23   Vicci Barnie NOVAK, MD  predniSONE  (DELTASONE ) 10 MG tablet Take 4 tablets (40 mg total) by mouth daily with breakfast for 3 days, THEN 3 tablets (30 mg total) daily with breakfast for 3 days, THEN 2 tablets (20 mg total) daily with breakfast for 3 days, THEN 1 tablet (10 mg total) daily with breakfast for 21 days. 12/27/23 02/08/24  Newlin, Enobong, MD  pregabalin  (LYRICA ) 100 MG capsule Take 1 capsule (100 mg total) by mouth 2 (two) times daily. 06/21/23   Vicci Barnie NOVAK, MD  pregabalin  (LYRICA ) 100 MG capsule Take 1 capsule (100 mg total) by mouth 2 (two) times daily. 12/21/23   Vicci Barnie NOVAK, MD  triamcinolone  (KENALOG ) 0.025 % ointment Apply 1 Application topically 2 (two) times daily. Mix with nystatin . Use for 2 weeks. Patient not taking: No sig reported 04/20/23   Alm Delon SAILOR, DO    Physical Exam: Vitals:  01/11/24 2115 01/11/24 2200 01/11/24 2230 01/11/24 2300  BP: 130/72 125/69 116/74 104/62  Pulse: (!) 133 (!) 138 (!) 126 (!) 117  Resp: (!) 23 (!) 27 (!) 28 20  Temp:    99.9 F (37.7 C)  TempSrc:    Axillary  SpO2: 97% 97% 100% 98%  Weight:      Height:       Constitutional: Acutely ill looking, obtunded, in obvious distress, diaphoretic  Eyes: PERRL, lids and conjunctivae normal ENMT: Mucous membranes are moist. Posterior pharynx clear of any exudate or lesions.Normal dentition.  Neck: normal, supple, no masses, no thyromegaly Respiratory: Coarse breath sounds, tachypneic, mild expiratory wheezing.  Cardiovascular: Sinus tachycardia, no murmurs / rubs / gallops. No extremity edema. 2+ pedal pulses. No carotid bruits.  Abdomen: no tenderness, no masses palpated. No hepatosplenomegaly. Bowel sounds positive.  Musculoskeletal: Good range of motion, no joint  swelling or tenderness, Skin: no rashes, lesions, ulcers. No induration Neurologic: CN 2-12 grossly intact. Sensation intact, DTR normal. Strength 5/5 in all 4.  Psychiatric: Confused mildly agitated  Data Reviewed:  Temperature 104.5 initially currently 99.5, heart rate 138, respiratory 35 oxygen  sat 91% on BiPAP currently on 4 L, white count 11.1 hemoglobin 12.2.  BUN is 44 creatinine 1.50 glucose 117.  Initial ABG showed a pH of 7.35 pCO2 53 and pO2 185 on BiPAP.  Acute viral screen is negative for influenza A B RSV and COVID-19.  Alcohol  level less than 15.  Chest x-ray showed no acute findings but limited evaluation due to low inspiration.  EKG showed sinus tachycardia.  Urinalysis is currently pending.  Assessment and Plan:  #1 sepsis of unknown source: Based on patient's presentation.  Initial broad-spectrum antibiotics as well as fluid resuscitation of for sepsis management.  Follow-up blood culture results and adjust medicine accordingly.  #2 acute on chronic hypoxic respiratory failure: Patient initially on BiPAP but currently on nasal cannula.  Continue to titrate oxygen  and monitor.  Continue treatment of COPD exacerbation with steroid, antibiotics as well as breathing treatment.  #3 acute exacerbation of COPD: Continue as above with steroid antibiotics and nebulizer treatment  #4 AKI: Hydrate and monitor renal function  #5 intellectual disability: Patient has altered mental status on top of these.  Currently confused.  Monitor clinically  #6 acute metabolic encephalopathy: Due to acute illness.  Continue to treat underlying causes.  #7 GERD: Continue with PPIs  #8 type 2 diabetes: Non-insulin -dependent.  Patient on steroid.  Initiate subcu insulin  with sliding scale  #9 essential hypertension: Continue blood pressure monitoring and treatment.  Resume home regimen once confirmed.    Advance Care Planning:   Code Status: Prior full code  Consults: None  Family  Communication: No family at bedside  Severity of Illness: The appropriate patient status for this patient is INPATIENT. Inpatient status is judged to be reasonable and necessary in order to provide the required intensity of service to ensure the patient's safety. The patient's presenting symptoms, physical exam findings, and initial radiographic and laboratory data in the context of their chronic comorbidities is felt to place them at high risk for further clinical deterioration. Furthermore, it is not anticipated that the patient will be medically stable for discharge from the hospital within 2 midnights of admission.   * I certify that at the point of admission it is my clinical judgment that the patient will require inpatient hospital care spanning beyond 2 midnights from the point of admission due to high intensity of service,  high risk for further deterioration and high frequency of surveillance required.*  AuthorBETHA SIM KNOLL, MD 01/11/2024 11:44 PM  For on call review [email or web address].     [1] No Known Allergies  "

## 2024-01-12 ENCOUNTER — Other Ambulatory Visit: Payer: Self-pay

## 2024-01-12 DIAGNOSIS — E119 Type 2 diabetes mellitus without complications: Secondary | ICD-10-CM

## 2024-01-12 DIAGNOSIS — Z7984 Long term (current) use of oral hypoglycemic drugs: Secondary | ICD-10-CM

## 2024-01-12 DIAGNOSIS — R509 Fever, unspecified: Secondary | ICD-10-CM

## 2024-01-12 DIAGNOSIS — J441 Chronic obstructive pulmonary disease with (acute) exacerbation: Secondary | ICD-10-CM | POA: Diagnosis not present

## 2024-01-12 DIAGNOSIS — Z9981 Dependence on supplemental oxygen: Secondary | ICD-10-CM

## 2024-01-12 DIAGNOSIS — J9611 Chronic respiratory failure with hypoxia: Secondary | ICD-10-CM

## 2024-01-12 LAB — COMPREHENSIVE METABOLIC PANEL WITH GFR
ALT: 19 U/L (ref 0–44)
AST: 19 U/L (ref 15–41)
Albumin: 3.5 g/dL (ref 3.5–5.0)
Alkaline Phosphatase: 47 U/L (ref 38–126)
Anion gap: 11 (ref 5–15)
BUN: 40 mg/dL — ABNORMAL HIGH (ref 8–23)
CO2: 25 mmol/L (ref 22–32)
Calcium: 8.9 mg/dL (ref 8.9–10.3)
Chloride: 101 mmol/L (ref 98–111)
Creatinine, Ser: 1.16 mg/dL (ref 0.61–1.24)
GFR, Estimated: >60 mL/min
Glucose, Bld: 266 mg/dL — ABNORMAL HIGH (ref 70–99)
Potassium: 5 mmol/L (ref 3.5–5.1)
Sodium: 136 mmol/L (ref 135–145)
Total Bilirubin: 0.4 mg/dL (ref 0.0–1.2)
Total Protein: 6.3 g/dL — ABNORMAL LOW (ref 6.5–8.1)

## 2024-01-12 LAB — CBG MONITORING, ED
Glucose-Capillary: 119 mg/dL — ABNORMAL HIGH (ref 70–99)
Glucose-Capillary: 131 mg/dL — ABNORMAL HIGH (ref 70–99)
Glucose-Capillary: 179 mg/dL — ABNORMAL HIGH (ref 70–99)
Glucose-Capillary: 226 mg/dL — ABNORMAL HIGH (ref 70–99)

## 2024-01-12 LAB — CORTISOL-AM, BLOOD: Cortisol - AM: 12 ug/dL (ref 6.7–22.6)

## 2024-01-12 LAB — URINALYSIS, W/ REFLEX TO CULTURE (INFECTION SUSPECTED)
Bacteria, UA: NONE SEEN
Bilirubin Urine: NEGATIVE
Glucose, UA: >=500 mg/dL — AB
Hgb urine dipstick: NEGATIVE
Ketones, ur: NEGATIVE mg/dL
Leukocytes,Ua: NEGATIVE
Nitrite: NEGATIVE
Protein, ur: NEGATIVE mg/dL
Specific Gravity, Urine: 1.021 (ref 1.005–1.030)
pH: 5 (ref 5.0–8.0)

## 2024-01-12 LAB — GLUCOSE, CAPILLARY
Glucose-Capillary: 88 mg/dL (ref 70–99)
Glucose-Capillary: 137 mg/dL — ABNORMAL HIGH (ref 70–99)

## 2024-01-12 LAB — CBC
HCT: 37.9 % — ABNORMAL LOW (ref 39.0–52.0)
Hemoglobin: 12.1 g/dL — ABNORMAL LOW (ref 13.0–17.0)
MCH: 31.1 pg (ref 26.0–34.0)
MCHC: 31.9 g/dL (ref 30.0–36.0)
MCV: 97.4 fL (ref 80.0–100.0)
Platelets: 263 K/uL (ref 150–400)
RBC: 3.89 MIL/uL — ABNORMAL LOW (ref 4.22–5.81)
RDW: 13.7 % (ref 11.5–15.5)
WBC: 9.7 K/uL (ref 4.0–10.5)
nRBC: 0 % (ref 0.0–0.2)

## 2024-01-12 LAB — PROTIME-INR
INR: 1.2 (ref 0.8–1.2)
Prothrombin Time: 15.5 s — ABNORMAL HIGH (ref 11.4–15.2)

## 2024-01-12 MED ORDER — ADULT MULTIVITAMIN W/MINERALS CH
1.0000 | ORAL_TABLET | Freq: Every day | ORAL | Status: DC
  Administered 2024-01-12 – 2024-01-15 (×4): 1 via ORAL
  Filled 2024-01-12 (×4): qty 1

## 2024-01-12 MED ORDER — ONDANSETRON HCL 4 MG/2ML IJ SOLN: 4.0000 mg | Freq: Four times a day (QID) | INTRAMUSCULAR | Status: DC | PRN

## 2024-01-12 MED ORDER — METHYLPREDNISOLONE SODIUM SUCC 40 MG IJ SOLR: 40.0000 mg | Freq: Two times a day (BID) | INTRAMUSCULAR | Status: DC

## 2024-01-12 MED ORDER — ONDANSETRON HCL 4 MG PO TABS: 4.0000 mg | ORAL_TABLET | Freq: Four times a day (QID) | ORAL | Status: DC | PRN

## 2024-01-12 MED ORDER — ALBUTEROL SULFATE HFA 108 (90 BASE) MCG/ACT IN AERS: 2.0000 | INHALATION_SPRAY | Freq: Four times a day (QID) | RESPIRATORY_TRACT | Status: DC | PRN

## 2024-01-12 MED ORDER — METOPROLOL SUCCINATE ER 25 MG PO TB24
50.0000 mg | ORAL_TABLET | Freq: Every day | ORAL | Status: DC
  Administered 2024-01-12 – 2024-01-14 (×3): 50 mg via ORAL
  Filled 2024-01-12 (×3): qty 2

## 2024-01-12 MED ORDER — PREGABALIN 100 MG PO CAPS: 100.0000 mg | ORAL_CAPSULE | Freq: Two times a day (BID) | ORAL | Status: DC

## 2024-01-12 MED ORDER — VANCOMYCIN HCL IN DEXTROSE 1-5 GM/200ML-% IV SOLN: 1000.0000 mg | Freq: Once | INTRAVENOUS | Status: DC

## 2024-01-12 MED ORDER — FUROSEMIDE 40 MG PO TABS
40.0000 mg | ORAL_TABLET | Freq: Every day | ORAL | Status: DC
  Administered 2024-01-12 – 2024-01-15 (×4): 40 mg via ORAL
  Filled 2024-01-12: qty 2
  Filled 2024-01-12 (×3): qty 1

## 2024-01-12 MED ORDER — VANCOMYCIN HCL 1500 MG/300ML IV SOLN
1500.0000 mg | INTRAVENOUS | Status: DC
  Administered 2024-01-12: 1500 mg via INTRAVENOUS
  Filled 2024-01-12 (×2): qty 300

## 2024-01-12 MED ORDER — FOLIC ACID 1 MG PO TABS
1.0000 mg | ORAL_TABLET | Freq: Every day | ORAL | Status: DC
  Administered 2024-01-12 – 2024-01-15 (×4): 1 mg via ORAL
  Filled 2024-01-12 (×4): qty 1

## 2024-01-12 MED ORDER — ALBUTEROL SULFATE (2.5 MG/3ML) 0.083% IN NEBU: 2.5000 mg | INHALATION_SOLUTION | Freq: Four times a day (QID) | RESPIRATORY_TRACT | Status: DC | PRN

## 2024-01-12 MED ORDER — SODIUM CHLORIDE 0.9 % IV SOLN
2.0000 g | Freq: Two times a day (BID) | INTRAVENOUS | Status: DC
  Administered 2024-01-12 – 2024-01-15 (×8): 2 g via INTRAVENOUS
  Filled 2024-01-12 (×8): qty 12.5

## 2024-01-12 MED ORDER — THIAMINE HCL 100 MG/ML IJ SOLN
100.0000 mg | Freq: Once | INTRAMUSCULAR | Status: AC
  Administered 2024-01-12: 100 mg via INTRAMUSCULAR
  Filled 2024-01-12: qty 2

## 2024-01-12 MED ORDER — CHLORDIAZEPOXIDE HCL 25 MG PO CAPS
25.0000 mg | ORAL_CAPSULE | Freq: Four times a day (QID) | ORAL | Status: AC | PRN
  Administered 2024-01-13 (×2): 25 mg via ORAL
  Filled 2024-01-12 (×2): qty 1

## 2024-01-12 MED ORDER — PANTOPRAZOLE SODIUM 40 MG PO TBEC
40.0000 mg | DELAYED_RELEASE_TABLET | Freq: Every day | ORAL | Status: DC
  Administered 2024-01-12 – 2024-01-15 (×4): 40 mg via ORAL
  Filled 2024-01-12 (×4): qty 1

## 2024-01-12 MED ORDER — LOPERAMIDE HCL 2 MG PO CAPS: 2.0000 mg | ORAL_CAPSULE | ORAL | Status: AC | PRN

## 2024-01-12 MED ORDER — LOSARTAN POTASSIUM 50 MG PO TABS
100.0000 mg | ORAL_TABLET | Freq: Every day | ORAL | Status: DC
  Administered 2024-01-12 – 2024-01-15 (×4): 100 mg via ORAL
  Filled 2024-01-12 (×4): qty 2

## 2024-01-12 MED ORDER — INSULIN ASPART 100 UNIT/ML IJ SOLN
0.0000 [IU] | Freq: Every day | INTRAMUSCULAR | Status: DC
  Administered 2024-01-13: 3 [IU] via SUBCUTANEOUS
  Filled 2024-01-12: qty 3

## 2024-01-12 MED ORDER — HYDROXYZINE HCL 25 MG PO TABS: 25.0000 mg | ORAL_TABLET | Freq: Four times a day (QID) | ORAL | Status: AC | PRN

## 2024-01-12 MED ORDER — ATORVASTATIN CALCIUM 10 MG PO TABS
10.0000 mg | ORAL_TABLET | Freq: Every day | ORAL | Status: DC
  Administered 2024-01-12 – 2024-01-15 (×4): 10 mg via ORAL
  Filled 2024-01-12 (×4): qty 1

## 2024-01-12 MED ORDER — SODIUM CHLORIDE 0.9 % IV SOLN: 2.0000 g | Freq: Once | INTRAVENOUS | Status: DC

## 2024-01-12 MED ORDER — ONDANSETRON 4 MG PO TBDP: 4.0000 mg | ORAL_TABLET | Freq: Four times a day (QID) | ORAL | Status: AC | PRN

## 2024-01-12 MED ORDER — INSULIN ASPART 100 UNIT/ML IJ SOLN
0.0000 [IU] | Freq: Three times a day (TID) | INTRAMUSCULAR | Status: DC
  Administered 2024-01-12: 7 [IU] via SUBCUTANEOUS
  Administered 2024-01-12: 3 [IU] via SUBCUTANEOUS
  Administered 2024-01-13: 7 [IU] via SUBCUTANEOUS
  Administered 2024-01-13: 3 [IU] via SUBCUTANEOUS
  Administered 2024-01-14: 4 [IU] via SUBCUTANEOUS
  Administered 2024-01-14: 11 [IU] via SUBCUTANEOUS
  Administered 2024-01-14: 3 [IU] via SUBCUTANEOUS
  Administered 2024-01-15: 7 [IU] via SUBCUTANEOUS
  Filled 2024-01-12: qty 3
  Filled 2024-01-12: qty 7
  Filled 2024-01-12 (×2): qty 3
  Filled 2024-01-12: qty 11
  Filled 2024-01-12: qty 4
  Filled 2024-01-12: qty 3

## 2024-01-12 MED ORDER — PREGABALIN 100 MG PO CAPS
100.0000 mg | ORAL_CAPSULE | Freq: Every day | ORAL | Status: DC
  Administered 2024-01-12 – 2024-01-15 (×4): 100 mg via ORAL
  Filled 2024-01-12 (×4): qty 1

## 2024-01-12 MED ORDER — IPRATROPIUM-ALBUTEROL 0.5-2.5 (3) MG/3ML IN SOLN
3.0000 mL | Freq: Four times a day (QID) | RESPIRATORY_TRACT | Status: DC
  Administered 2024-01-12 – 2024-01-13 (×5): 3 mL via RESPIRATORY_TRACT
  Filled 2024-01-12 (×5): qty 3

## 2024-01-12 MED ORDER — ENOXAPARIN SODIUM 40 MG/0.4ML IJ SOSY
40.0000 mg | PREFILLED_SYRINGE | Freq: Every day | INTRAMUSCULAR | Status: DC
  Administered 2024-01-12 – 2024-01-15 (×4): 40 mg via SUBCUTANEOUS
  Filled 2024-01-12 (×4): qty 0.4

## 2024-01-12 MED ORDER — METRONIDAZOLE 500 MG/100ML IV SOLN
500.0000 mg | Freq: Two times a day (BID) | INTRAVENOUS | Status: DC
  Administered 2024-01-12 – 2024-01-13 (×3): 500 mg via INTRAVENOUS
  Filled 2024-01-12 (×4): qty 100

## 2024-01-12 MED ORDER — LACTATED RINGERS IV SOLN
150.0000 mL/h | INTRAVENOUS | Status: DC
  Administered 2024-01-12: 150 mL/h via INTRAVENOUS

## 2024-01-12 MED ORDER — VANCOMYCIN HCL 1500 MG/300ML IV SOLN
1500.0000 mg | Freq: Once | INTRAVENOUS | Status: AC
  Administered 2024-01-12: 1500 mg via INTRAVENOUS
  Filled 2024-01-12 (×2): qty 300

## 2024-01-12 MED ORDER — METHYLPREDNISOLONE SODIUM SUCC 125 MG IJ SOLR
80.0000 mg | Freq: Every day | INTRAMUSCULAR | Status: DC
  Administered 2024-01-12 – 2024-01-14 (×3): 80 mg via INTRAVENOUS
  Filled 2024-01-12 (×3): qty 2

## 2024-01-12 NOTE — Evaluation (Signed)
 Clinical/Bedside Swallow Evaluation Patient Details  Name: James [name] MRN: [id number] Date of Birth: Oct 13, 1960  Today's Date: 01/12/2024 Time: SLP Start Time (ACUTE ONLY): 1342 SLP Stop Time (ACUTE ONLY): 1352 SLP Time Calculation (min) (ACUTE ONLY): 10 min  Past Medical History:  Past Medical History:  Diagnosis Date   Acute on chronic respiratory failure with hypoxia (HCC) 06/08/2013   Acute on chronic respiratory failure with hypoxia and hypercapnia (HCC) 06/08/2013   AKI (acute kidney injury) 07/12/2022   Alcohol  use disorder 11/23/2018   CAP (community acquired pneumonia) 05/17/2018   See admit 05/17/18 ? rml  with   covid pcr neg - rx augmentin  > f/u cxr in 4-6 weeks is fine unless condition declines    Community acquired pneumonia 07/12/2021   Community acquired pneumonia of right lower lobe of lung 05/17/2018   See admit 05/17/18 ? 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   Recurrent pneumonia 02/20/2021   Tick bite 06/03/2018   Past Surgical History:  Past Surgical History:  Procedure Laterality Date   ESOPHAGEAL MANOMETRY N/A 09/15/2022   Procedure: ESOPHAGEAL MANOMETRY (EM);  Surgeon: Abran Norleen SAILOR, MD;  Location: [facility];  Service: Gastroenterology;  Laterality: N/A;   LEFT HEART CATH AND CORONARY ANGIOGRAPHY N/A 12/07/2021   Procedure: LEFT HEART CATH AND CORONARY ANGIOGRAPHY;  Surgeon: Court Dorn PARAS, MD;  Location: [facility];  Service: Cardiovascular;  Laterality: N/A;   LUNG  REMOVAL, PARTIAL     VIDEO ASSISTED THORACOSCOPY (VATS)/THOROCOTOMY Left 06/11/2013   Procedure: VIDEO ASSISTED THORACOSCOPY (VATS)/THOROCOTOMY;  Surgeon: Maude Fleeta Ochoa, MD;  Location: [facility] OR;  Service: Thoracic;  Laterality: Left;   VIDEO BRONCHOSCOPY N/A 06/11/2013   Procedure: VIDEO BRONCHOSCOPY;  Surgeon: Maude Fleeta Ochoa, MD;  Location: [facility] OR;  Service: Thoracic;  Laterality: N/A;   VIDEO BRONCHOSCOPY N/A 08/10/2018   Procedure: VIDEO BRONCHOSCOPY WITH FLUORO;  Surgeon: Claudene Toribio BROCKS, MD;  Location: [facility] END[facility]COPY;  Service: Endoscopy;  Laterality: N/A;   HPI:  64 yo male presenting to ED 1/7 with [facility] and cough. Admitted with sepsis secondary to suspected early pneumonia with acute on chronic COPD exacerbation. CXR shows no acute cardiopulmonary findings. Two previous MBS 2022 and 2023 during admissions for pneumonia were WFL with no penetration/aspiration. Esophagram 2023 reports mild dysmotility, small hiatal hernia, and small-volume GER with f/u in 2024 reporting only a tiny hiatal hernia. PMH includes COPD, chronic respiratory failure on supplemental O2 at baseline, DM, essential HTN, CAD, alcohol  abuse (stopped two days PTA)    Assessment / Plan / Recommendation  Clinical Impression  Pt presents similarly to previous SLP encounters, reporting globus sensation that is painful. He took large, consecutive sips of thin liquids without signs clinically concerning for aspiration. Oral transit of regular solids is functional despite edentulism. While he is admitted with suspected early pneumonia per MD note, CXR 1/7 is negative and he presents with no clinical s/s of dysphagia or aspiration. Discussed with Dr. Juvenal, who is in agreement  with continuing current diet without repeating instrumental testing. Signing off acutely.   SLP Visit Diagnosis: Dysphagia, unspecified (R13.10)    Aspiration Risk  Mild aspiration risk    Diet Recommendation           Other Recommendations Oral Care  Recommendations: Oral care BID     Swallow Evaluation Recommendations Recommendations: PO diet PO Diet Recommendation: Regular;Thin liquids (Level 0) Liquid Administration via: Cup;Straw Medication Administration: Whole meds with liquid Supervision: Patient able to self-feed Swallowing strategies  : Slow rate;Small bites/sips Postural changes: Position pt fully upright for meals;Stay upright 30-60 min after meals Oral care recommendations: Oral care BID (2x/day)   Assistance Recommended at Discharge    Functional Status Assessment Patient has not had a recent decline in their functional status  Frequency and Duration            Prognosis Prognosis for improved oropharyngeal function: Good Barriers to Reach Goals: Time post onset      Swallow Study   General HPI: 64 yo male presenting to ED 1/7 with [facility] and cough. Admitted with sepsis secondary to suspected early pneumonia with acute on chronic COPD exacerbation. CXR shows no acute cardiopulmonary findings. Two previous MBS 2022 and 2023 during admissions for pneumonia were WFL with no penetration/aspiration. Esophagram 2023 reports mild dysmotility, small hiatal hernia, and small-volume GER with f/u in 2024 reporting only a tiny hiatal hernia. PMH includes COPD, chronic respiratory failure on supplemental O2 at baseline, DM, essential HTN, CAD, alcohol  abuse (stopped two days PTA) Type of Study: Bedside Swallow Evaluation Previous Swallow Assessment: see HPI Diet Prior to this Study: Regular;Thin liquids (Level 0) Temperature Spikes Noted: No Respiratory Status: Nasal cannula History of Recent Intubation: No Behavior/Cognition: Alert;Cooperative;Pleasant mood Oral Cavity Assessment: Within Functional Limits Oral Care Completed by SLP: No Oral Cavity - Dentition: Edentulous Vision: Functional for self-feeding Self-Feeding Abilities: Able to feed self Patient Positioning: Upright in bed Baseline Vocal Quality:  Normal Volitional Cough: Strong Volitional Swallow: Able to elicit    Oral/Motor/Sensory Function Overall Oral Motor/Sensory Function: Within functional limits   Ice Chips Ice chips: Not tested   Thin Liquid Thin Liquid: Within functional limits Presentation: Straw;Self Fed    Nectar Thick Nectar Thick Liquid: Not tested   Honey Thick Honey Thick Liquid: Not tested   Puree Puree: Not tested   Solid     Solid: Within functional limits Presentation: Self Fed      Damien Blumenthal, M.A., CCC-SLP Speech Language Pathology, Acute Rehabilitation Services  Secure Chat preferred [phone]  01/12/2024,2:18 PM

## 2024-01-12 NOTE — ED Notes (Addendum)
 Pt assisted to the bathroom via wheelchair by this RN. Pt tolerated well.

## 2024-01-12 NOTE — Progress Notes (Signed)
 " PROGRESS NOTE    James [name]  FMW:[id number] DOB: [birth year] DOA: 01/11/2024 PCP: Vicci Barnie NOVAK, MD    Brief Narrative:  James [name] is a 64 y.o. male with medical history significant of COPD, chronic respiratory failure with  on oxygen , diabetes, essential hypertension, coronary artery disease,  alcohol  abuse (stopped 2 days ago) who presented to the ER with significant shortness of breath cough and acute hypoxic respiratory failure requiring BiPAP.  Patient was noted to have significant elevated temperature.  Initially elevated at 104.5, leukocytosis and tachycardia.  He meets sepsis criteria but no obvious source.  Suspected to be early pneumonia with a COPD exacerbation.  Patient has now been titrated off BiPAP to 4 L/min (home amount)  Assessment and Plan: sepsis from pna - Initial broad-spectrum antibiotics  - Follow-up blood culture results and adjust medicine accordingly. -SLP for possible aspiration   acute on chronic hypoxic respiratory failure: Patient initially on BiPAP but currently on nasal cannula.  Continue to titrate oxygen  and monitor.  Continue treatment of COPD exacerbation with steroid, antibiotics as well as breathing treatment.   acute exacerbation of COPD: Continue as above with steroid antibiotics and nebulizer treatment   AKI:  -resolved   Alcohol  abuse - Stopped 2 days ago per patient - Appears to be having symptoms of withdrawal - Did well on Librium  taper at last admission so we will order  intellectual disability:  -appears to be at baseline   acute metabolic encephalopathy:  -appears to be at baseline   GERD: Continue with PPIs   type 2 diabetes:  =Non-insulin -dependent.   =Patient on steroid.   =SSI    essential hypertension:  -  Resume home regimen    DVT prophylaxis: enoxaparin  (LOVENOX ) injection 40 mg Start: 01/12/24 1000    Code Status: Full Code Family Communication:   Disposition Plan:  Level of care:  Progressive Status is: Inpatient   Consultants:  none   Subjective: Back on normal [place] -says stopped drinking beer 2 days ago   Objective: Vitals:   01/12/24 0415 01/12/24 0745 01/12/24 0745 01/12/24 0753  BP: 103/63 (!) 150/107    Pulse: 74 71    Resp: (!) 21 15    Temp:    97.7 F (36.5 C)  TempSrc:    Oral  SpO2: 97% 98% 98%   Weight:      Height:        Intake/Output Summary (Last 24 hours) at 01/12/2024 9171 Last data filed at 01/12/2024 0238 Gross per 24 hour  Intake 97.08 ml  Output --  Net 97.08 ml   Filed Weights   01/11/24 2045  Weight: 84.5 kg    Examination:   General: Appearance:     Overweight male in no acute distress     Lungs:     respirations unlabored  Heart:    Normal heart rate   MS:   All extremities are intact.    Neurologic:   Awake, alert       Data Reviewed: I have personally reviewed following labs and imaging studies  CBC: Recent Labs  Lab 01/11/24 2104 01/11/24 2111 01/11/24 2122 01/12/24 0437  WBC 11.1*  --   --  9.7  NEUTROABS 8.2*  --   --   --   HGB 12.7* 13.6 12.2* 12.1*  HCT 39.6 40.0 36.0* 37.9*  MCV 96.1  --   --  97.4  PLT 280  --   --  263  Basic Metabolic Panel: Recent Labs  Lab 01/11/24 2104 01/11/24 2111 01/11/24 2122 01/12/24 0437  NA 138 138 137 136  K 4.5 4.4 4.3 5.0  CL 99 100  --  101  CO2 28  --   --  25  GLUCOSE 124* 117*  --  266*  BUN 46* 44*  --  40*  CREATININE 1.42* 1.50*  --  1.16  CALCIUM  9.3  --   --  8.9   GFR: Estimated Creatinine Clearance: 66 mL/min (by C-G formula based on SCr of 1.16 mg/dL). Liver Function Tests: Recent Labs  Lab 01/11/24 2104 01/12/24 0437  AST 25 19  ALT 21 19  ALKPHOS 53 47  BILITOT 0.4 0.4  PROT 6.7 6.3*  ALBUMIN  3.9 3.5   No results for input(s): LIPASE, AMYLASE in the last 168 hours. No results for input(s): AMMONIA in the last 168 hours. Coagulation Profile: Recent Labs  Lab 01/11/24 2104 01/12/24 0437  INR 1.1 1.2    Cardiac Enzymes: No results for input(s): CKTOTAL, CKMB, CKMBINDEX, TROPONINI in the last 168 hours. BNP (last 3 results) No results for input(s): PROBNP in the last 8760 hours. HbA1C: No results for input(s): HGBA1C in the last 72 hours. CBG: Recent Labs  Lab 01/12/24 0051 01/12/24 0809  GLUCAP 179* 226*   Lipid Profile: No results for input(s): CHOL, HDL, LDLCALC, TRIG, CHOLHDL, LDLDIRECT in the last 72 hours. Thyroid Function Tests: No results for input(s): TSH, T4TOTAL, FREET4, T3FREE, THYROIDAB in the last 72 hours. Anemia Panel: No results for input(s): VITAMINB12, FOLATE, FERRITIN, TIBC, IRON , RETICCTPCT in the last 72 hours. Sepsis Labs: Recent Labs  Lab 01/11/24 2112 01/11/24 2256  LATICACIDVEN 1.2 1.1    Recent Results (from the past 240 hours)  Blood Culture (routine x 2)     Status: None (Preliminary result)   Collection Time: 01/11/24  8:49 PM   Specimen: BLOOD  Result Value Ref Range Status   Specimen Description BLOOD SITE NOT SPECIFIED  Final   Special Requests   Final    BOTTLES DRAWN AEROBIC AND ANAEROBIC Blood Culture adequate volume   Culture   Final    NO GROWTH < 12 HOURS Performed at [facility], [address] [place], [place] [address]    Report Status PENDING  Incomplete  Blood Culture (routine x 2)     Status: None (Preliminary result)   Collection Time: 01/11/24  8:54 PM   Specimen: BLOOD  Result Value Ref Range Status   Specimen Description BLOOD SITE NOT SPECIFIED  Final   Special Requests   Final    BOTTLES DRAWN AEROBIC AND ANAEROBIC Blood Culture adequate volume   Culture   Final    NO GROWTH < 12 HOURS Performed at [facility], [address] [place], [place] [address]    Report Status PENDING  Incomplete  Resp panel by RT-PCR (RSV, Flu A&B, Covid) Anterior Nasal Swab     Status: None   Collection Time: 01/11/24  8:55 PM   Specimen: Anterior Nasal Swab  Result  Value Ref Range Status   SARS Coronavirus 2 by RT PCR NEGATIVE NEGATIVE Final   Influenza A by PCR NEGATIVE NEGATIVE Final   Influenza B by PCR NEGATIVE NEGATIVE Final    Comment: (NOTE) The Xpert Xpress SARS-CoV-2/FLU/RSV plus assay is intended as an aid in the diagnosis of influenza from Nasopharyngeal swab specimens and should not be used as a sole basis for treatment. Nasal washings and  aspirates are unacceptable for Xpert Xpress SARS-CoV-2/FLU/RSV testing.  Fact Sheet for Patients: bloggercourse.com  Fact Sheet for Healthcare Providers: seriousbroker.it  This test is not yet approved or cleared by the [place]  FDA and has been authorized for detection and/or diagnosis of SARS-CoV-2 by FDA under an Emergency Use Authorization (EUA). This EUA will remain in effect (meaning this test can be used) for the duration of the COVID-19 declaration under Section 564(b)(1) of the Act, 21 U.S.C. section 360bbb-3(b)(1), unless the authorization is terminated or revoked.     Resp Syncytial Virus by PCR NEGATIVE NEGATIVE Final    Comment: (NOTE) Fact Sheet for Patients: bloggercourse.com  Fact Sheet for Healthcare Providers: seriousbroker.it  This test is not yet approved or cleared by the [place]  FDA and has been authorized for detection and/or diagnosis of SARS-CoV-2 by FDA under an Emergency Use Authorization (EUA). This EUA will remain in effect (meaning this test can be used) for the duration of the COVID-19 declaration under Section 564(b)(1) of the Act, 21 U.S.C. section 360bbb-3(b)(1), unless the authorization is terminated or revoked.  Performed at [facility], [address] [place], [place] [address]          Radiology Studies: DG Chest Port 1 View Result Date: 01/11/2024 EXAM: 1 VIEW(S) XRAY OF THE CHEST 01/11/2024 08:59:00 PM COMPARISON: Chest AP  portable 11/12/2023. CLINICAL HISTORY: Questionable sepsis - evaluate for abnormality. FINDINGS: LUNGS AND PLEURA: There is a low inspiration. Limited view of the bases. The visualized lungs are clear, but basilar disease could be missed. No pleural effusion. No pneumothorax. HEART AND MEDIASTINUM: There is aortic atherosclerosis with stable mediastinum. The cardiac size is normal. BONES AND SOFT TISSUES: There are multiple chronic healed fracture deformities of the left greater than right ribs. No acute osseous abnormality. IMPRESSION: 1. No acute cardiopulmonary  findings. 2. Limited evaluation of the bases due to low inspiration. Consider PA and lateral study in full inspiration if clinically warranted. Electronically signed by: Francis Quam MD 01/11/2024 09:12 PM EST RP Workstation: [id number]        Scheduled Meds:  atorvastatin   10 mg Oral Daily   enoxaparin  (LOVENOX ) injection  40 mg Subcutaneous Daily   insulin  aspart  0-20 Units Subcutaneous TID WC   insulin  aspart  0-5 Units Subcutaneous QHS   ipratropium-albuterol   3 mL Nebulization Q6H   methylPREDNISolone  (SOLU-MEDROL ) injection  80 mg Intravenous Daily   metoprolol  succinate  50 mg Oral Daily   multivitamin with minerals  1 tablet Oral Daily   pantoprazole   40 mg Oral Daily   pregabalin   100 mg Oral Daily   thiamine   100 mg Intramuscular Once   Continuous Infusions:  ceFEPime  (MAXIPIME ) IV Stopped (01/12/24 0238)   metronidazole  Stopped (01/12/24 0559)   vancomycin        LOS: 1 day    Time spent: 45 minutes spent on chart review, discussion with nursing staff, consultants, updating family and interview/physical exam; more than 50% of that time was spent in counseling and/or coordination of care.    Harlene RAYMOND Bowl, DO [facility] Available via Epic secure chat 7am-7pm After these hours, please refer to coverage provider listed on [email or web address] 01/12/2024, 8:28 AM   "

## 2024-01-12 NOTE — Progress Notes (Signed)
 Pharmacy Antibiotic Note  James [name] is a 64 y.o. male admitted on 01/11/2024 with sepsis.  Pharmacy has been consulted for Vancomycin  dosing. WBC mildly elevated. Noted renal dysfunction.   Plan: Vancomycin  1500 mg IV q24h >>>Estimated AUC: 527 Cefepime  per MD Trend WBC, temp, renal function  F/U infectious work-up Drug levels as indicated   Height: 5' 9 (175.3 cm) Weight: 84.5 kg (186 lb 4.6 oz) IBW/kg (Calculated) : 70.7  Temp (24hrs), Avg:101.7 F (38.7 C), Min:99.9 F (37.7 C), Max:104.5 F (40.3 C)  Recent Labs  Lab 01/11/24 2104 01/11/24 2111 01/11/24 2112 01/11/24 2256  WBC 11.1*  --   --   --   CREATININE 1.42* 1.50*  --   --   LATICACIDVEN  --   --  1.2 1.1    Estimated Creatinine Clearance: 51.1 mL/min (A) (by C-G formula based on SCr of 1.5 mg/dL (H)).    Allergies[1]  Lynwood Mckusick, PharmD, BCPS Clinical Pharmacist Phone: [phone]      [1] No Known Allergies

## 2024-01-13 DIAGNOSIS — J441 Chronic obstructive pulmonary disease with (acute) exacerbation: Secondary | ICD-10-CM | POA: Diagnosis not present

## 2024-01-13 DIAGNOSIS — R509 Fever, unspecified: Secondary | ICD-10-CM | POA: Diagnosis not present

## 2024-01-13 DIAGNOSIS — E119 Type 2 diabetes mellitus without complications: Secondary | ICD-10-CM | POA: Diagnosis not present

## 2024-01-13 DIAGNOSIS — Z7984 Long term (current) use of oral hypoglycemic drugs: Secondary | ICD-10-CM | POA: Diagnosis not present

## 2024-01-13 LAB — CBC
HCT: 36.2 % — ABNORMAL LOW (ref 39.0–52.0)
Hemoglobin: 11.7 g/dL — ABNORMAL LOW (ref 13.0–17.0)
MCH: 31 pg (ref 26.0–34.0)
MCHC: 32.3 g/dL (ref 30.0–36.0)
MCV: 95.8 fL (ref 80.0–100.0)
Platelets: 278 K/uL (ref 150–400)
RBC: 3.78 MIL/uL — ABNORMAL LOW (ref 4.22–5.81)
RDW: 13.6 % (ref 11.5–15.5)
WBC: 11.4 K/uL — ABNORMAL HIGH (ref 4.0–10.5)
nRBC: 0 % (ref 0.0–0.2)

## 2024-01-13 LAB — GLUCOSE, CAPILLARY
Glucose-Capillary: 134 mg/dL — ABNORMAL HIGH (ref 70–99)
Glucose-Capillary: 116 mg/dL — ABNORMAL HIGH (ref 70–99)
Glucose-Capillary: 258 mg/dL — ABNORMAL HIGH (ref 70–99)
Glucose-Capillary: 289 mg/dL — ABNORMAL HIGH (ref 70–99)

## 2024-01-13 LAB — BASIC METABOLIC PANEL WITH GFR
Anion gap: 8 (ref 5–15)
BUN: 28 mg/dL — ABNORMAL HIGH (ref 8–23)
CO2: 28 mmol/L (ref 22–32)
Calcium: 8.7 mg/dL — ABNORMAL LOW (ref 8.9–10.3)
Chloride: 102 mmol/L (ref 98–111)
Creatinine, Ser: 0.88 mg/dL (ref 0.61–1.24)
GFR, Estimated: >60 mL/min
Glucose, Bld: 114 mg/dL — ABNORMAL HIGH (ref 70–99)
Potassium: 4.6 mmol/L (ref 3.5–5.1)
Sodium: 139 mmol/L (ref 135–145)

## 2024-01-13 LAB — MRSA NEXT GEN BY PCR, NASAL: MRSA by PCR Next Gen: NOT DETECTED

## 2024-01-13 MED ORDER — BUDESON-GLYCOPYRROL-FORMOTEROL 160-9-4.8 MCG/ACT IN AERO
2.0000 | INHALATION_SPRAY | Freq: Two times a day (BID) | RESPIRATORY_TRACT | Status: DC
  Administered 2024-01-14 – 2024-01-15 (×2): 2 via RESPIRATORY_TRACT
  Filled 2024-01-13: qty 5.9

## 2024-01-13 MED ORDER — IPRATROPIUM-ALBUTEROL 0.5-2.5 (3) MG/3ML IN SOLN
3.0000 mL | Freq: Two times a day (BID) | RESPIRATORY_TRACT | Status: DC
  Administered 2024-01-13 – 2024-01-15 (×3): 3 mL via RESPIRATORY_TRACT
  Filled 2024-01-13 (×3): qty 3

## 2024-01-13 NOTE — Progress Notes (Signed)
 " PROGRESS NOTE    James [name]  FMW:[id number] DOB: 02-14-1960 DOA: 01/11/2024 PCP: Vicci Barnie NOVAK, MD    Brief Narrative:  James [name] is a 64 y.o. male with medical history significant of COPD, chronic respiratory failure with  on oxygen , diabetes, essential hypertension, coronary artery disease,  alcohol  abuse (stopped 2 days ago) who presented to the ER with significant shortness of breath cough and acute hypoxic respiratory failure requiring BiPAP.  Patient was noted to have significant elevated temperature.  Initially elevated at 104.5, leukocytosis and tachycardia.  He meets sepsis criteria but no obvious source.  Suspected to be early pneumonia with a COPD exacerbation.  Patient has now been titrated off BiPAP to 4 L/min (home amount)  Assessment and Plan: sepsis from pna -broad-spectrum antibiotics  - Follow-up blood culture results and adjust medicine accordingly. - No sign of aspiration per SLP -Sputum culture if able   acute on chronic hypoxic respiratory failure: Patient initially on BiPAP but currently on nasal cannula.  Continue to titrate oxygen  and monitor.  Continue treatment of COPD exacerbation with steroid, antibiotics as well as breathing treatment. -Wears 4 L at home   acute exacerbation of COPD: Continue as above with steroid antibiotics and nebulizer treatment   AKI:  -resolved   Alcohol  abuse - Stopped 2 days ago per patient - Appears to be having symptoms of withdrawal - Did well on Librium  taper at last admission so we will order again  intellectual disability:  -appears to be at baseline   acute metabolic encephalopathy:  -appears to be at baseline   GERD: Continue with PPIs   type 2 diabetes:  =Non-insulin -dependent.   =Patient on steroid.   =SSI    essential hypertension:  -  Resume home regimen    DVT prophylaxis: enoxaparin  (LOVENOX ) injection 40 mg Start: 01/12/24 1000    Code Status: Full Code Family Communication:    Disposition Plan:  Level of care: Telemetry Status is: Inpatient   Consultants:  none   Subjective: Continues to have difficulty breathing   Objective: Vitals:   01/13/24 0114 01/13/24 0557 01/13/24 0821 01/13/24 1011  BP:  121/80  125/80  Pulse:  87 84 91  Resp:  16 20 18   Temp:  98.9 F (37.2 C)  98 F (36.7 C)  TempSrc:  Oral  Oral  SpO2: 95% 95%  95%  Weight:      Height:        Intake/Output Summary (Last 24 hours) at 01/13/2024 1405 Last data filed at 01/13/2024 1200 Gross per 24 hour  Intake 758.51 ml  Output 1300 ml  Net -541.49 ml   Filed Weights   01/11/24 2045  Weight: 84.5 kg    Examination:   General: Appearance:     Overweight male in no acute distress     Lungs:   Appears to have slightly increased work of breathing, mild wheezing  Heart:    Normal heart rate   MS:   All extremities are intact.    Neurologic:   Awake, alert       Data Reviewed: I have personally reviewed following labs and imaging studies  CBC: Recent Labs  Lab 01/11/24 2104 01/11/24 2111 01/11/24 2122 01/12/24 0437 01/13/24 0638  WBC 11.1*  --   --  9.7 11.4*  NEUTROABS 8.2*  --   --   --   --   HGB 12.7* 13.6 12.2* 12.1* 11.7*  HCT 39.6 40.0 36.0* 37.9* 36.2*  MCV 96.1  --   --  97.4 95.8  PLT 280  --   --  263 278   Basic Metabolic Panel: Recent Labs  Lab 01/11/24 2104 01/11/24 2111 01/11/24 2122 01/12/24 0437 01/13/24 0638  NA 138 138 137 136 139  K 4.5 4.4 4.3 5.0 4.6  CL 99 100  --  101 102  CO2 28  --   --  25 28  GLUCOSE 124* 117*  --  266* 114*  BUN 46* 44*  --  40* 28*  CREATININE 1.42* 1.50*  --  1.16 0.88  CALCIUM  9.3  --   --  8.9 8.7*   GFR: Estimated Creatinine Clearance: 87 mL/min (by C-G formula based on SCr of 0.88 mg/dL). Liver Function Tests: Recent Labs  Lab 01/11/24 2104 01/12/24 0437  AST 25 19  ALT 21 19  ALKPHOS 53 47  BILITOT 0.4 0.4  PROT 6.7 6.3*  ALBUMIN  3.9 3.5   No results for input(s): LIPASE,  AMYLASE in the last 168 hours. No results for input(s): AMMONIA in the last 168 hours. Coagulation Profile: Recent Labs  Lab 01/11/24 2104 01/12/24 0437  INR 1.1 1.2   Cardiac Enzymes: No results for input(s): CKTOTAL, CKMB, CKMBINDEX, TROPONINI in the last 168 hours. BNP (last 3 results) No results for input(s): PROBNP in the last 8760 hours. HbA1C: No results for input(s): HGBA1C in the last 72 hours. CBG: Recent Labs  Lab 01/12/24 1435 01/12/24 1713 01/12/24 2231 01/13/24 0848 01/13/24 1217  GLUCAP 131* 88 137* 134* 116*   Lipid Profile: No results for input(s): CHOL, HDL, LDLCALC, TRIG, CHOLHDL, LDLDIRECT in the last 72 hours. Thyroid Function Tests: No results for input(s): TSH, T4TOTAL, FREET4, T3FREE, THYROIDAB in the last 72 hours. Anemia Panel: No results for input(s): VITAMINB12, FOLATE, FERRITIN, TIBC, IRON , RETICCTPCT in the last 72 hours. Sepsis Labs: Recent Labs  Lab 01/11/24 2112 01/11/24 2256  LATICACIDVEN 1.2 1.1    Recent Results (from the past 240 hours)  Blood Culture (routine x 2)     Status: None (Preliminary result)   Collection Time: 01/11/24  8:49 PM   Specimen: BLOOD  Result Value Ref Range Status   Specimen Description BLOOD SITE NOT SPECIFIED  Final   Special Requests   Final    BOTTLES DRAWN AEROBIC AND ANAEROBIC Blood Culture adequate volume   Culture   Final    NO GROWTH 2 DAYS Performed at [facility], [address], [place], [place] [address]    Report Status PENDING  Incomplete  Blood Culture (routine x 2)     Status: None (Preliminary result)   Collection Time: 01/11/24  8:54 PM   Specimen: BLOOD  Result Value Ref Range Status   Specimen Description BLOOD SITE NOT SPECIFIED  Final   Special Requests   Final    BOTTLES DRAWN AEROBIC AND ANAEROBIC Blood Culture adequate volume   Culture   Final    NO GROWTH 2 DAYS Performed at [facility], [address], [place], [place] [address]    Report Status PENDING  Incomplete  Resp panel by RT-PCR (RSV, Flu A&B, Covid) Anterior Nasal Swab     Status: None   Collection Time: 01/11/24  8:55 PM   Specimen: Anterior Nasal Swab  Result Value Ref Range Status   SARS Coronavirus 2 by RT PCR NEGATIVE NEGATIVE Final   Influenza A by PCR NEGATIVE NEGATIVE Final   Influenza B by PCR NEGATIVE NEGATIVE  Final    Comment: (NOTE) The Xpert Xpress SARS-CoV-2/FLU/RSV plus assay is intended as an aid in the diagnosis of influenza from Nasopharyngeal swab specimens and should not be used as a sole basis for treatment. Nasal washings and aspirates are unacceptable for Xpert Xpress SARS-CoV-2/FLU/RSV testing.  Fact Sheet for Patients: bloggercourse.com  Fact Sheet for Healthcare Providers: seriousbroker.it  This test is not yet approved or cleared by the [place]  FDA and has been authorized for detection and/or diagnosis of SARS-CoV-2 by FDA under an Emergency Use Authorization (EUA). This EUA will remain in effect (meaning this test can be used) for the duration of the COVID-19 declaration under Section 564(b)(1) of the Act, 21 U.S.C. section 360bbb-3(b)(1), unless the authorization is terminated or revoked.     Resp Syncytial Virus by PCR NEGATIVE NEGATIVE Final    Comment: (NOTE) Fact Sheet for Patients: bloggercourse.com  Fact Sheet for Healthcare Providers: seriousbroker.it  This test is not yet approved or cleared by the [place]  FDA and has been authorized for detection and/or diagnosis of SARS-CoV-2 by FDA under an Emergency Use Authorization (EUA). This EUA will remain in effect (meaning this test can be used) for the duration of the COVID-19 declaration under Section 564(b)(1) of the Act, 21 U.S.C. section 360bbb-3(b)(1), unless the authorization is terminated or revoked.  Performed  at [facility], [address], [place], [place] [address]   MRSA Next Gen by PCR, Nasal     Status: None   Collection Time: 01/13/24  9:39 AM   Specimen: Nasal Mucosa; Nasal Swab  Result Value Ref Range Status   MRSA by PCR Next Gen NOT DETECTED NOT DETECTED Final    Comment: (NOTE) The GeneXpert MRSA Assay (FDA approved for NASAL specimens only), is one component of a comprehensive MRSA colonization surveillance program. It is not intended to diagnose MRSA infection nor to guide or monitor treatment for MRSA infections. Test performance is not FDA approved in patients less than 49 years old. Performed at [facility], [address], [place], [place] [address]          Radiology Studies: DG Chest Port 1 View Result Date: 01/11/2024 EXAM: 1 VIEW(S) XRAY OF THE CHEST 01/11/2024 08:59:00 PM COMPARISON: Chest AP portable 11/12/2023. CLINICAL HISTORY: Questionable sepsis - evaluate for abnormality. FINDINGS: LUNGS AND PLEURA: There is a low inspiration. Limited view of the bases. The visualized lungs are clear, but basilar disease could be missed. No pleural effusion. No pneumothorax. HEART AND MEDIASTINUM: There is aortic atherosclerosis with stable mediastinum. The cardiac size is normal. BONES AND SOFT TISSUES: There are multiple chronic healed fracture deformities of the left greater than right ribs. No acute osseous abnormality. IMPRESSION: 1. No acute cardiopulmonary  findings. 2. Limited evaluation of the bases due to low inspiration. Consider PA and lateral study in full inspiration if clinically warranted. Electronically signed by: Francis Quam MD 01/11/2024 09:12 PM EST RP Workstation: [id number]        Scheduled Meds:  atorvastatin   10 mg Oral Daily   budesonide -glycopyrrolate -formoterol   2 puff Inhalation BID   enoxaparin  (LOVENOX ) injection  40 mg Subcutaneous Daily   folic acid   1 mg Oral Daily   furosemide   40 mg Oral Daily   insulin  aspart  0-20 Units  Subcutaneous TID WC   insulin  aspart  0-5 Units Subcutaneous QHS   ipratropium-albuterol   3 mL Nebulization BID   losartan   100 mg Oral Daily   methylPREDNISolone  (SOLU-MEDROL ) injection  80  mg Intravenous Daily   metoprolol  succinate  50 mg Oral Daily   multivitamin with minerals  1 tablet Oral Daily   pantoprazole   40 mg Oral Daily   pregabalin   100 mg Oral Daily   Continuous Infusions:  ceFEPime  (MAXIPIME ) IV 2 g (01/13/24 0931)   vancomycin  Stopped (01/13/24 0044)     LOS: 2 days    Time spent: 45 minutes spent on chart review, discussion with nursing staff, consultants, updating family and interview/physical exam; more than 50% of that time was spent in counseling and/or coordination of care.    Harlene RAYMOND Bowl, DO [facility] Available via Epic secure chat 7am-7pm After these hours, please refer to coverage provider listed on [email or web address] 01/13/2024, 2:05 PM   "

## 2024-01-13 NOTE — Evaluation (Signed)
 Physical Therapy Evaluation Patient Details Name: James [name] MRN: [id number] DOB: 07/01/1960 Today's Date: 01/13/2024  History of Present Illness  Patient is a 64 y/o male admitted 01/11/24 with fever 104.5, leukocytosis, tachycardia and SOB requiring Bipap.  PMH positive for ETOH abuse, COPD, AKI, DM, GERD, HTN, intellectual disability.  Clinical Impression  Patient presents with decreased mobility due to generalized weakness, decreased activity tolerance and decreased safety awareness.  Previously living alone in one level home with 8 steps to enter with rails.  States has to take transportation for appointments and to grocery store.  Patient normally using RW vs no device though admits to about 5 falls in past 6 months.  Denies injury and reports able to recover on his own.  States his legs just give out.  Today able to ambulate with RW and no LOB or leg buckling though pt able to state when he needed seated rest break.  Re-enforced self monitoring technique and seated rest to reduce fall risk as well as potential for grabbar on edge of tub and using RW.  He was on RA upon entry with SpO2 88% and maintained at 88-87% with ambulation in hallway, dropping to 81% upon seated rest and 2L O2 applied with SpO2 93% with ambulation.  Feel he will benefit from skilled PT in the acute setting and though follow up HHPT recommended, pt declining follow up PT services.       If plan is discharge home, recommend the following: Assist for transportation;Help with stairs or ramp for entrance;Assistance with cooking/housework   Can travel by private vehicle        Equipment Recommendations None recommended by PT  Recommendations for Other Services       Functional Status Assessment Patient has had a recent decline in their functional status and demonstrates the ability to make significant improvements in function in a reasonable and predictable amount of time.     Precautions / Restrictions  Precautions Precautions: Fall Precaution/Restrictions Comments: Home O2; falls about 5 in last 6 months, denies injury, reports gets up on his own      Mobility  Bed Mobility               General bed mobility comments: in recliner    Transfers Overall transfer level: Modified independent Equipment used: None                    Ambulation/Gait Ambulation/Gait assistance: Supervision Gait Distance (Feet): 120 Feet (x 2) Assistive device: Rolling walker (2 wheels) Gait Pattern/deviations: Step-through pattern, Decreased stride length       General Gait Details: using walker safely in hallway without buckling though assist for in room mobility with obstacles and without RW; seated rest in hallway per pt and encouraged to self monitor at home to reduce falls  Stairs Stairs:  (declined stairs pracitce today)          Wheelchair Mobility     Tilt Bed    Modified Rankin (Stroke Patients Only)       Balance Overall balance assessment: Needs assistance   Sitting balance-Leahy Scale: Good     Standing balance support: No upper extremity supported Standing balance-Leahy Scale: Fair Standing balance comment: standing without UE support initially, using walker for ambulation                             Pertinent Vitals/Pain Pain Assessment Pain Assessment: No/denies pain  Home Living Family/patient expects to be discharged to:: Private residence Living Arrangements: Alone   Type of Home: House Home Access: Stairs to enter Entrance Stairs-Rails: Doctor, General Practice of Steps: 8   Home Layout: One level Home Equipment: Agricultural Consultant (2 wheels) Additional Comments: wears 4L O2 at baseline    Prior Function Prior Level of Function : History of Falls (last six months)             Mobility Comments: using RW at times ADLs Comments: sits in tub to bathe; completes IADL's except doesn't drive; wants to quit  drinking     Extremity/Trunk Assessment   Upper Extremity Assessment Upper Extremity Assessment: Overall WFL for tasks assessed    Lower Extremity Assessment Lower Extremity Assessment: RLE deficits/detail;LLE deficits/detail;Overall WFL for tasks assessed RLE Sensation: history of peripheral neuropathy LLE Sensation: history of peripheral neuropathy       Communication   Communication Communication: Impaired Factors Affecting Communication: Hearing impaired    Cognition Arousal: Alert Behavior During Therapy: WFL for tasks assessed/performed   PT - Cognitive impairments: History of cognitive impairments                       PT - Cognition Comments: confused upon admission, seems likely close to baseline, RN reports she cannot keep him from getting up on his own Following commands: Intact       Cueing Cueing Techniques: Verbal cues     General Comments General comments (skin integrity, edema, etc.): discussed safety with mobility, need to sit when feeling legs tire to avoid falls and when SOB, initially pt on RA with SpO2 88%, ambulation on RA with SpO2 88%, dropped to 81% with seated rest in hallway so applied O2 @ 2LPM with improvement to 93% with ambulation.    Exercises     Assessment/Plan    PT Assessment Patient needs continued PT services  PT Problem List Decreased activity tolerance;Cardiopulmonary status limiting activity;Decreased safety awareness;Decreased mobility       PT Treatment Interventions DME instruction;Gait training;Stair training;Balance training;Patient/family education    PT Goals (Current goals can be found in the Care Plan section)  Acute Rehab PT Goals Patient Stated Goal: return home when better PT Goal Formulation: With patient Time For Goal Achievement: 01/27/24 Potential to Achieve Goals: Good    Frequency Min 3X/week     Co-evaluation               AM-PAC PT 6 Clicks Mobility  Outcome Measure Help  needed turning from your back to your side while in a flat bed without using bedrails?: None Help needed moving from lying on your back to sitting on the side of a flat bed without using bedrails?: None Help needed moving to and from a bed to a chair (including a wheelchair)?: None Help needed standing up from a chair using your arms (e.g., wheelchair or bedside chair)?: None Help needed to walk in hospital room?: A Little Help needed climbing 3-5 steps with a railing? : A Little 6 Click Score: 22    End of Session Equipment Utilized During Treatment: Oxygen  Activity Tolerance: Patient tolerated treatment well Patient left: in chair;with call bell/phone within reach;with chair alarm set Nurse Communication: Mobility status PT Visit Diagnosis: History of falling (Z91.81);Difficulty in walking, not elsewhere classified (R26.2)    Time: 8589-8560 PT Time Calculation (min) (ACUTE ONLY): 29 min   Charges:   PT Evaluation $PT Eval Moderate Complexity: 1 Mod PT Treatments $  Self Care/Home Management: 8-22 PT General Charges $$ ACUTE PT VISIT: 1 Visit         Micheline Portal, PT Acute Rehabilitation Services Office:[phone] 01/13/2024   Montie Portal 01/13/2024, 3:16 PM

## 2024-01-13 NOTE — TOC Initial Note (Signed)
 Transition of Care (TOC) - Initial/Assessment Note   Patient from home alone.   Has home oxygen  with [employer]. Patient requesting portable oxygen  tank and a cab voucher home at discharge. Address confirmed.   On day of discharge Inpatient Care Management Team will order portable oxygen  tank from [employer].   Instructed patient he needs to try to get a ride home on day of discharge. If not discharge lounge can arrange  Patient Details  Name: James [name] MRN: [id number] Date of Birth: 03-Jan-1961  [facility]) CM/SW Contact:    Stephane Powell Jansky, RN Phone Number: 01/13/2024, 12:06 PM  Clinical Narrative:                     Barriers to Discharge: Continued Medical Work up   Patient Goals and CMS Choice Patient states their goals for this hospitalization and ongoing recovery are:: to return to home   Choice offered to / list presented to : NA      Expected Discharge Plan and Services In-house Referral: NA Discharge Planning Services: CM Consult Post Acute Care Choice: NA Living arrangements for the past 2 months: Single Family Home                           HH Arranged: NA HH Agency: NA        Prior Living Arrangements/Services Living arrangements for the past 2 months: Single Family Home Lives with:: Self Patient language and need for interpreter reviewed:: Yes Do you feel safe going back to the place where you live?: Yes      Need for Family Participation in Patient Care: No (Comment) Care giver support system in place?: No (comment) Current home services: DME Criminal Activity/Legal Involvement Pertinent to Current Situation/Hospitalization: No - Comment as needed  Activities of Daily Living   ADL Screening (condition at time of admission) Independently performs ADLs?: Yes (appropriate for developmental age) Is the patient deaf or have difficulty hearing?: No Does the patient have difficulty seeing, even when wearing glasses/contacts?:  No Does the patient have difficulty concentrating, remembering, or making decisions?: No  Permission Sought/Granted   Permission granted to share information with : No              Emotional Assessment Appearance:: Appears stated age Attitude/Demeanor/Rapport: Engaged Affect (typically observed): Appropriate Orientation: : Oriented to Self, Oriented to Place, Oriented to  Time, Oriented to Situation Alcohol  / Substance Use: Alcohol  Use Psych Involvement: No (comment)  Admission diagnosis:  COPD exacerbation (HCC) [J44.1] Febrile illness [R50.9] Sepsis (HCC) [A41.9] Diabetes mellitus treated with oral medication (HCC) [E11.9, Z79.84] Chronic respiratory failure with hypoxia, on home oxygen  therapy (HCC) [J96.11, Z99.81] Patient Active Problem List   Diagnosis Date Noted   Sepsis (HCC) 01/11/2024   Pain in right knee 03/14/2023   COPD with acute exacerbation (HCC) 02/16/2023   COPD exacerbation (HCC) 11/22/2022   Dysphagia 10/01/2022   Alcohol  dependence in remission (HCC) 03/02/2022   Aortic root dilation 03/02/2022   Vision changes 12/29/2021   Oropharyngeal dysphagia 12/29/2021   Onychomycosis 12/29/2021   Malnutrition of moderate degree 12/05/2021   Tinea cruris 07/21/2021   Multiple lung nodules on CT 06/11/2021   COPD with chronic bronchitis (HCC) 02/01/2021   GERD (gastroesophageal reflux disease) 02/01/2021   Neuropathy 09/29/2020   Bronchiectasis (HCC) 09/24/2019   Type 2 diabetes mellitus (HCC) 07/24/2019   Other atopic dermatitis 05/01/2019   Acute on chronic  respiratory failure (HCC) 04/23/2019   Intellectual disability 04/23/2019   HTN (hypertension) 05/17/2016   Former tobacco use 04/06/2016   COPD mixed type (HCC) 03/26/2016   PCP:  Vicci Barnie NOVAK, MD Pharmacy:   [facility] - [facility] [address], [address] [place] [address] Phone: [phone] Fax: [phone]  [facility] - [facility] [address] [place] [address] Phone: [phone] Fax: [phone]     Social Drivers of Health (SDOH) Social History: SDOH Screenings   Food Insecurity: No Food Insecurity (01/12/2024)  Housing: Low Risk (01/12/2024)  Transportation Needs: Unmet Transportation Needs (01/12/2024)  Utilities: Not At Risk (01/12/2024)  Alcohol  Screen: Low Risk (09/09/2021)  Depression (PHQ2-9): Low Risk (03/03/2023)  Social Connections: Moderately Integrated (01/12/2024)  Tobacco Use: Medium Risk (01/11/2024)   SDOH Interventions:     Readmission Risk Interventions     No data to display

## 2024-01-14 DIAGNOSIS — Z9981 Dependence on supplemental oxygen: Secondary | ICD-10-CM | POA: Diagnosis not present

## 2024-01-14 DIAGNOSIS — E119 Type 2 diabetes mellitus without complications: Secondary | ICD-10-CM | POA: Diagnosis not present

## 2024-01-14 DIAGNOSIS — J9611 Chronic respiratory failure with hypoxia: Secondary | ICD-10-CM | POA: Diagnosis not present

## 2024-01-14 DIAGNOSIS — J441 Chronic obstructive pulmonary disease with (acute) exacerbation: Secondary | ICD-10-CM | POA: Diagnosis not present

## 2024-01-14 LAB — GLUCOSE, CAPILLARY
Glucose-Capillary: 136 mg/dL — ABNORMAL HIGH (ref 70–99)
Glucose-Capillary: 180 mg/dL — ABNORMAL HIGH (ref 70–99)
Glucose-Capillary: 266 mg/dL — ABNORMAL HIGH (ref 70–99)
Glucose-Capillary: 270 mg/dL — ABNORMAL HIGH (ref 70–99)
Glucose-Capillary: 155 mg/dL — ABNORMAL HIGH (ref 70–99)

## 2024-01-14 MED ORDER — NICOTINE 14 MG/24HR TD PT24
14.0000 mg | MEDICATED_PATCH | Freq: Every day | TRANSDERMAL | Status: DC
  Administered 2024-01-14 – 2024-01-15 (×2): 14 mg via TRANSDERMAL
  Filled 2024-01-14 (×2): qty 1

## 2024-01-14 MED ORDER — METOPROLOL SUCCINATE ER 25 MG PO TB24
25.0000 mg | ORAL_TABLET | Freq: Every day | ORAL | Status: DC
  Administered 2024-01-15: 25 mg via ORAL
  Filled 2024-01-14: qty 1

## 2024-01-14 MED ORDER — METHYLPREDNISOLONE SODIUM SUCC 40 MG IJ SOLR
40.0000 mg | Freq: Every day | INTRAMUSCULAR | Status: DC
  Administered 2024-01-15: 40 mg via INTRAVENOUS

## 2024-01-14 NOTE — Progress Notes (Signed)
 " PROGRESS NOTE    James [name]  FMW:[id number] DOB: 03-Jun-1960 DOA: 01/11/2024 PCP: Vicci Barnie NOVAK, MD    Brief Narrative:  James [name] is a 64 y.o. male with medical history significant of COPD, chronic respiratory failure with  on oxygen , diabetes, essential hypertension, coronary artery disease,  alcohol  abuse (stopped 2 days ago) who presented to the ER with significant shortness of breath cough and acute hypoxic respiratory failure requiring BiPAP.  Patient was noted to have significant elevated temperature.  Initially elevated at 104.5, leukocytosis and tachycardia.  He meets sepsis criteria but no obvious source.  Suspected to be early pneumonia with a COPD exacerbation.  Patient has now been titrated off BiPAP to 4 L/min (home amount)  Assessment and Plan: sepsis from pna -broad-spectrum antibiotics - narrow to PO in the AM? - Follow-up blood culture results and adjust medicine accordingly. - No sign of aspiration per SLP -Sputum culture if able   acute on chronic hypoxic respiratory failure: Patient initially on BiPAP but currently on nasal cannula.  Continue to titrate oxygen  and monitor.  Continue treatment of COPD exacerbation with steroid, antibiotics as well as breathing treatment. -Wears 4 L at home -Will start to wean steroids   acute exacerbation of COPD: Continue as above with steroid antibiotics and nebulizer treatment -See above   AKI:  -resolved   Alcohol  abuse - Stopped 2 days ago per patient - Did well on Librium  taper at last admission so we will order again -trend CIWA  intellectual disability:  -appears to be at baseline   acute metabolic encephalopathy:  -appears to be at baseline   GERD: Continue with PPIs   type 2 diabetes:  =Non-insulin -dependent.   =Patient on steroid.   =SSI    essential hypertension:  - Adjust medications with holding parameters   DVT prophylaxis: enoxaparin  (LOVENOX ) injection 40 mg Start: 01/12/24 1000     Code Status: Full Code  Disposition Plan:  Level of care: Telemetry Status is: Inpatient   Consultants:  none   Subjective: Breathing is slightly better, was able to wash up at the sink and change clothes   Objective: Vitals:   01/13/24 2037 01/13/24 2222 01/14/24 0341 01/14/24 0824  BP:  (!) 119/51 124/70 93/83  Pulse: 71 80 62 65  Resp: 18 16 16    Temp:  98 F (36.7 C) (!) 97.5 F (36.4 C) 97.9 F (36.6 C)  TempSrc:  Oral Oral Oral  SpO2: 95% 97% 91% 97%  Weight:      Height:        Intake/Output Summary (Last 24 hours) at 01/14/2024 1409 Last data filed at 01/14/2024 1100 Gross per 24 hour  Intake 816 ml  Output --  Net 816 ml   Filed Weights   01/11/24 2045  Weight: 84.5 kg    Examination:   General: Appearance:     Overweight male in no acute distress     Lungs:   Appears more comfortable  Heart:    Normal heart rate   MS:   All extremities are intact.    Neurologic:   Awake, alert       Data Reviewed: I have personally reviewed following labs and imaging studies  CBC: Recent Labs  Lab 01/11/24 2104 01/11/24 2111 01/11/24 2122 01/12/24 0437 01/13/24 0638  WBC 11.1*  --   --  9.7 11.4*  NEUTROABS 8.2*  --   --   --   --   HGB 12.7* 13.6  12.2* 12.1* 11.7*  HCT 39.6 40.0 36.0* 37.9* 36.2*  MCV 96.1  --   --  97.4 95.8  PLT 280  --   --  263 278   Basic Metabolic Panel: Recent Labs  Lab 01/11/24 2104 01/11/24 2111 01/11/24 2122 01/12/24 0437 01/13/24 0638  NA 138 138 137 136 139  K 4.5 4.4 4.3 5.0 4.6  CL 99 100  --  101 102  CO2 28  --   --  25 28  GLUCOSE 124* 117*  --  266* 114*  BUN 46* 44*  --  40* 28*  CREATININE 1.42* 1.50*  --  1.16 0.88  CALCIUM  9.3  --   --  8.9 8.7*   GFR: Estimated Creatinine Clearance: 87 mL/min (by C-G formula based on SCr of 0.88 mg/dL). Liver Function Tests: Recent Labs  Lab 01/11/24 2104 01/12/24 0437  AST 25 19  ALT 21 19  ALKPHOS 53 47  BILITOT 0.4 0.4  PROT 6.7 6.3*  ALBUMIN   3.9 3.5   No results for input(s): LIPASE, AMYLASE in the last 168 hours. No results for input(s): AMMONIA in the last 168 hours. Coagulation Profile: Recent Labs  Lab 01/11/24 2104 01/12/24 0437  INR 1.1 1.2   Cardiac Enzymes: No results for input(s): CKTOTAL, CKMB, CKMBINDEX, TROPONINI in the last 168 hours. BNP (last 3 results) No results for input(s): PROBNP in the last 8760 hours. HbA1C: No results for input(s): HGBA1C in the last 72 hours. CBG: Recent Labs  Lab 01/13/24 1217 01/13/24 1752 01/13/24 2012 01/14/24 0750 01/14/24 1201  GLUCAP 116* 258* 289* 136* 180*   Lipid Profile: No results for input(s): CHOL, HDL, LDLCALC, TRIG, CHOLHDL, LDLDIRECT in the last 72 hours. Thyroid Function Tests: No results for input(s): TSH, T4TOTAL, FREET4, T3FREE, THYROIDAB in the last 72 hours. Anemia Panel: No results for input(s): VITAMINB12, FOLATE, FERRITIN, TIBC, IRON , RETICCTPCT in the last 72 hours. Sepsis Labs: Recent Labs  Lab 01/11/24 2112 01/11/24 2256  LATICACIDVEN 1.2 1.1    Recent Results (from the past 240 hours)  Blood Culture (routine x 2)     Status: None (Preliminary result)   Collection Time: 01/11/24  8:49 PM   Specimen: BLOOD  Result Value Ref Range Status   Specimen Description BLOOD SITE NOT SPECIFIED  Final   Special Requests   Final    BOTTLES DRAWN AEROBIC AND ANAEROBIC Blood Culture adequate volume   Culture   Final    NO GROWTH 3 DAYS Performed at [facility], [address] [place], [place] [address]    Report Status PENDING  Incomplete  Blood Culture (routine x 2)     Status: None (Preliminary result)   Collection Time: 01/11/24  8:54 PM   Specimen: BLOOD  Result Value Ref Range Status   Specimen Description BLOOD SITE NOT SPECIFIED  Final   Special Requests   Final    BOTTLES DRAWN AEROBIC AND ANAEROBIC Blood Culture adequate volume   Culture   Final    NO GROWTH 3  DAYS Performed at [facility], [address], [place], [place] [address]    Report Status PENDING  Incomplete  Resp panel by RT-PCR (RSV, Flu A&B, Covid) Anterior Nasal Swab     Status: None   Collection Time: 01/11/24  8:55 PM   Specimen: Anterior Nasal Swab  Result Value Ref Range Status   SARS Coronavirus 2 by RT PCR NEGATIVE NEGATIVE Final   Influenza A by PCR  NEGATIVE NEGATIVE Final   Influenza B by PCR NEGATIVE NEGATIVE Final    Comment: (NOTE) The Xpert Xpress SARS-CoV-2/FLU/RSV plus assay is intended as an aid in the diagnosis of influenza from Nasopharyngeal swab specimens and should not be used as a sole basis for treatment. Nasal washings and aspirates are unacceptable for Xpert Xpress SARS-CoV-2/FLU/RSV testing.  Fact Sheet for Patients: bloggercourse.com  Fact Sheet for Healthcare Providers: seriousbroker.it  This test is not yet approved or cleared by the [place]  FDA and has been authorized for detection and/or diagnosis of SARS-CoV-2 by FDA under an Emergency Use Authorization (EUA). This EUA will remain in effect (meaning this test can be used) for the duration of the COVID-19 declaration under Section 564(b)(1) of the Act, 21 U.S.C. section 360bbb-3(b)(1), unless the authorization is terminated or revoked.     Resp Syncytial Virus by PCR NEGATIVE NEGATIVE Final    Comment: (NOTE) Fact Sheet for Patients: bloggercourse.com  Fact Sheet for Healthcare Providers: seriousbroker.it  This test is not yet approved or cleared by the [place]  FDA and has been authorized for detection and/or diagnosis of SARS-CoV-2 by FDA under an Emergency Use Authorization (EUA). This EUA will remain in effect (meaning this test can be used) for the duration of the COVID-19 declaration under Section 564(b)(1) of the Act, 21 U.S.C. section 360bbb-3(b)(1), unless the  authorization is terminated or revoked.  Performed at [facility], [address], [place], [place] [address]   MRSA Next Gen by PCR, Nasal     Status: None   Collection Time: 01/13/24  9:39 AM   Specimen: Nasal Mucosa; Nasal Swab  Result Value Ref Range Status   MRSA by PCR Next Gen NOT DETECTED NOT DETECTED Final    Comment: (NOTE) The GeneXpert MRSA Assay (FDA approved for NASAL specimens only), is one component of a comprehensive MRSA colonization surveillance program. It is not intended to diagnose MRSA infection nor to guide or monitor treatment for MRSA infections. Test performance is not FDA approved in patients less than 32 years old. Performed at [facility], [name], [place], [place] [address]          Radiology Studies: No results found.       Scheduled Meds:  atorvastatin   10 mg Oral Daily   budesonide -glycopyrrolate -formoterol   2 puff Inhalation BID   enoxaparin  (LOVENOX ) injection  40 mg Subcutaneous Daily   folic acid   1 mg Oral Daily   furosemide   40 mg Oral Daily   insulin  aspart  0-20 Units Subcutaneous TID WC   insulin  aspart  0-5 Units Subcutaneous QHS   ipratropium-albuterol   3 mL Nebulization BID   losartan   100 mg Oral Daily   [START ON 01/15/2024] methylPREDNISolone  (SOLU-MEDROL ) injection  40 mg Intravenous Daily   [START ON 01/15/2024] metoprolol  succinate  25 mg Oral Daily   multivitamin with minerals  1 tablet Oral Daily   pantoprazole   40 mg Oral Daily   pregabalin   100 mg Oral Daily   Continuous Infusions:  ceFEPime  (MAXIPIME ) IV 2 g (01/14/24 0821)     LOS: 3 days    Time spent: 45 minutes spent on chart review, discussion with nursing staff, consultants, updating family and interview/physical exam; more than 50% of that time was spent in counseling and/or coordination of care.    James RAYMOND Bowl, DO [facility] Available via Epic secure chat 7am-7pm After these hours, please refer to coverage  provider listed on [email or web address] 01/14/2024, 2:09  PM   "

## 2024-01-15 ENCOUNTER — Other Ambulatory Visit (HOSPITAL_COMMUNITY): Payer: Self-pay

## 2024-01-15 DIAGNOSIS — E1141 Type 2 diabetes mellitus with diabetic mononeuropathy: Secondary | ICD-10-CM

## 2024-01-15 LAB — GLUCOSE, CAPILLARY
Glucose-Capillary: 116 mg/dL — ABNORMAL HIGH (ref 70–99)
Glucose-Capillary: 218 mg/dL — ABNORMAL HIGH (ref 70–99)

## 2024-01-15 MED ORDER — PREDNISONE 50 MG PO TABS
ORAL_TABLET | ORAL | 0 refills | Status: AC
  Filled 2024-01-15: qty 3, 3d supply, fill #0

## 2024-01-15 MED ORDER — DOXYCYCLINE HYCLATE 100 MG PO TABS
100.0000 mg | ORAL_TABLET | Freq: Two times a day (BID) | ORAL | 0 refills | Status: AC
  Filled 2024-01-15: qty 6, 3d supply, fill #0

## 2024-01-15 MED ORDER — PREGABALIN 100 MG PO CAPS: 100.0000 mg | ORAL_CAPSULE | Freq: Every day | ORAL | Status: AC

## 2024-01-15 MED ORDER — DOXYCYCLINE HYCLATE 100 MG PO TABS
100.0000 mg | ORAL_TABLET | Freq: Two times a day (BID) | ORAL | Status: DC
  Administered 2024-01-15: 100 mg via ORAL
  Filled 2024-01-15: qty 1

## 2024-01-15 MED ORDER — PREDNISONE 50 MG PO TABS: 50.0000 mg | ORAL_TABLET | Freq: Every day | ORAL | Status: DC

## 2024-01-15 MED ORDER — METOPROLOL SUCCINATE ER 50 MG PO TB24
25.0000 mg | ORAL_TABLET | Freq: Every day | ORAL | 0 refills | Status: AC
  Filled 2024-01-15: qty 30, 60d supply, fill #0

## 2024-01-15 NOTE — Plan of Care (Signed)
" °  Problem: Respiratory: Goal: Ability to maintain adequate ventilation will improve Outcome: Progressing   Problem: Health Behavior/Discharge Planning: Goal: Ability to manage health-related needs will improve Outcome: Progressing   Problem: Clinical Measurements: Goal: Respiratory complications will improve Outcome: Progressing   Problem: Safety: Goal: Ability to remain free from injury will improve Outcome: Progressing   "

## 2024-01-15 NOTE — TOC Transition Note (Addendum)
 Transition of Care [facility]) - Discharge Note   Patient Details  Name: James [name] MRN: [id number] Date of Birth: 1960-09-10  Transition of Care [facility]) CM/SW Contact:  Robynn Eileen Hoose, RN Phone Number: 01/15/2024, 11:05 AM   Clinical Narrative:   Patient is being discharged today. Patient confirmed that he needs portable oxygen  tank to travel home with. Message sent to [employer] through [facility] Portal.  1504: Call to [employer] rep regarding ETA of portable oxygen  tank. Unable to give ETA at this time, delivery driver currently servicing another area.  1526: Per [employer] rep, ETA for oxygen  tank is about 1 hour.    Final next level of care: Home/Self Care Barriers to Discharge: Continued Medical Work up   Patient Goals and CMS Choice Patient states their goals for this hospitalization and ongoing recovery are:: to return to home   Choice offered to / list presented to : NA      Discharge Placement                       Discharge Plan and Services Additional resources added to the After Visit Summary for   In-house Referral: NA Discharge Planning Services: CM Consult Post Acute Care Choice: NA                    HH Arranged: NA HH Agency: NA        Social Drivers of Health (SDOH) Interventions SDOH Screenings   Food Insecurity: No Food Insecurity (01/12/2024)  Housing: Low Risk (01/12/2024)  Transportation Needs: Unmet Transportation Needs (01/12/2024)  Utilities: Not At Risk (01/12/2024)  Alcohol  Screen: Low Risk (09/09/2021)  Depression (PHQ2-9): Low Risk (03/03/2023)  Social Connections: Moderately Integrated (01/12/2024)  Tobacco Use: Medium Risk (01/11/2024)     Readmission Risk Interventions     No data to display

## 2024-01-15 NOTE — Discharge Summary (Signed)
 "     Physician Discharge Summary  James [name]:[id number] DOB: Sep 14, 1960 DOA: 01/11/2024  PCP: Vicci Barnie NOVAK, MD  Admit date: 01/11/2024 Discharge date: 01/15/2024  Admitted From:  Discharge disposition: Home   Recommendations for Outpatient Follow-Up:   Close follow-up with PCP   Discharge Diagnosis:   Active Problems:   COPD mixed type (HCC)   HTN (hypertension)   Type 2 diabetes mellitus (HCC)   Multiple lung nodules on CT   Former tobacco use   Acute on chronic respiratory failure (HCC)   Intellectual disability   GERD (gastroesophageal reflux disease)   Alcohol  dependence in remission (HCC)   Sepsis (HCC)    Discharge Condition: Improved.  Diet recommendation: Low sodium, heart healthy.  Carbohydrate-modified.   Wound care: None.  Code status: Full.   History of Present Illness:   James [name] is a 64 y.o. male with medical history significant of COPD, chronic respiratory failure with  on oxygen , diabetes, essential hypertension, coronary artery disease,  alcohol  abuse (stopped 2 days ago) who presented to the ER with significant shortness of breath cough and acute hypoxic respiratory failure requiring BiPAP.  Patient was noted to have significant elevated temperature.  Initially elevated at 104.5, leukocytosis and tachycardia.  He meets sepsis criteria but no obvious source.  Suspected to be early pneumonia with a COPD exacerbation.  Patient has now been titrated off BiPAP to 4 L/min (home amount)     Hospital Course by Problem:   sepsis from pna -broad-spectrum antibiotics - narrow to PO  - No sign of aspiration per SLP -Sputum culture not done   acute on chronic hypoxic respiratory failure: Patient initially on BiPAP but currently on nasal cannula.  Continue to titrate oxygen  and monitor.  Continue treatment of COPD exacerbation with steroid, antibiotics as well as breathing treatment. -Wears 4 L at home - DC on steroid taper   acute  exacerbation of COPD: Continue as above with steroid antibiotics and nebulizer treatment -See above   AKI:  -resolved   Alcohol  abuse - Stopped 2 days ago per patient - No sign of withdrawal   intellectual disability:  -appears to be at baseline   acute metabolic encephalopathy:  -appears to be at baseline   GERD: Continue with PPIs   type 2 diabetes:  = Resume home regimen    essential hypertension:  - Adjust medications    Medical Consultants:      Discharge Exam:   Vitals:   01/15/24 0606 01/15/24 0800  BP: 132/70 113/77  Pulse: 66 65  Resp:    Temp: 98.1 F (36.7 C) 97.9 F (36.6 C)  SpO2: 97% 100%   Vitals:   01/14/24 1907 01/14/24 2200 01/15/24 0606 01/15/24 0800  BP: 131/77 107/69 132/70 113/77  Pulse:  72 66 65  Resp: 16     Temp: 98.1 F (36.7 C) (!) 97.5 F (36.4 C) 98.1 F (36.7 C) 97.9 F (36.6 C)  TempSrc:  Oral Oral Oral  SpO2:  96% 97% 100%  Weight:      Height:        General exam: Appears calm and comfortable.   The results of significant diagnostics from this hospitalization (including imaging, microbiology, ancillary and laboratory) are listed below for reference.     Procedures and Diagnostic Studies:   DG Chest Port 1 View Result Date: 01/11/2024 EXAM: 1 VIEW(S) XRAY OF THE CHEST 01/11/2024 08:59:00 PM COMPARISON: Chest AP portable 11/12/2023. CLINICAL HISTORY: Questionable sepsis -  evaluate for abnormality. FINDINGS: LUNGS AND PLEURA: There is a low inspiration. Limited view of the bases. The visualized lungs are clear, but basilar disease could be missed. No pleural effusion. No pneumothorax. HEART AND MEDIASTINUM: There is aortic atherosclerosis with stable mediastinum. The cardiac size is normal. BONES AND SOFT TISSUES: There are multiple chronic healed fracture deformities of the left greater than right ribs. No acute osseous abnormality. IMPRESSION: 1. No acute cardiopulmonary  findings. 2. Limited evaluation of the bases  due to low inspiration. Consider PA and lateral study in full inspiration if clinically warranted. Electronically signed by: Francis Quam MD 01/11/2024 09:12 PM EST RP Workstation: [id number]     Labs:   Basic Metabolic Panel: Recent Labs  Lab 01/11/24 2104 01/11/24 2111 01/11/24 2122 01/12/24 0437 01/13/24 0638  NA 138 138 137 136 139  K 4.5 4.4 4.3 5.0 4.6  CL 99 100  --  101 102  CO2 28  --   --  25 28  GLUCOSE 124* 117*  --  266* 114*  BUN 46* 44*  --  40* 28*  CREATININE 1.42* 1.50*  --  1.16 0.88  CALCIUM  9.3  --   --  8.9 8.7*   GFR Estimated Creatinine Clearance: 87 mL/min (by C-G formula based on SCr of 0.88 mg/dL). Liver Function Tests: Recent Labs  Lab 01/11/24 2104 01/12/24 0437  AST 25 19  ALT 21 19  ALKPHOS 53 47  BILITOT 0.4 0.4  PROT 6.7 6.3*  ALBUMIN  3.9 3.5   No results for input(s): LIPASE, AMYLASE in the last 168 hours. No results for input(s): AMMONIA in the last 168 hours. Coagulation profile Recent Labs  Lab 01/11/24 2104 01/12/24 0437  INR 1.1 1.2    CBC: Recent Labs  Lab 01/11/24 2104 01/11/24 2111 01/11/24 2122 01/12/24 0437 01/13/24 0638  WBC 11.1*  --   --  9.7 11.4*  NEUTROABS 8.2*  --   --   --   --   HGB 12.7* 13.6 12.2* 12.1* 11.7*  HCT 39.6 40.0 36.0* 37.9* 36.2*  MCV 96.1  --   --  97.4 95.8  PLT 280  --   --  263 278   Cardiac Enzymes: No results for input(s): CKTOTAL, CKMB, CKMBINDEX, TROPONINI in the last 168 hours. BNP: Invalid input(s): POCBNP CBG: Recent Labs  Lab 01/14/24 1201 01/14/24 1655 01/14/24 1729 01/14/24 2157 01/15/24 0759  GLUCAP 180* 270* 266* 155* 116*   D-Dimer No results for input(s): DDIMER in the last 72 hours. Hgb A1c No results for input(s): HGBA1C in the last 72 hours. Lipid Profile No results for input(s): CHOL, HDL, LDLCALC, TRIG, CHOLHDL, LDLDIRECT in the last 72 hours. Thyroid function studies No results for input(s): TSH, T4TOTAL,  T3FREE, THYROIDAB in the last 72 hours.  Invalid input(s): FREET3 Anemia work up No results for input(s): VITAMINB12, FOLATE, FERRITIN, TIBC, IRON , RETICCTPCT in the last 72 hours. Microbiology Recent Results (from the past 240 hours)  Blood Culture (routine x 2)     Status: None (Preliminary result)   Collection Time: 01/11/24  8:49 PM   Specimen: BLOOD  Result Value Ref Range Status   Specimen Description BLOOD SITE NOT SPECIFIED  Final   Special Requests   Final    BOTTLES DRAWN AEROBIC AND ANAEROBIC Blood Culture adequate volume   Culture   Final    NO GROWTH 4 DAYS Performed at [facility], [address], [name], [place] [address]    Report  Status PENDING  Incomplete  Blood Culture (routine x 2)     Status: None (Preliminary result)   Collection Time: 01/11/24  8:54 PM   Specimen: BLOOD  Result Value Ref Range Status   Specimen Description BLOOD SITE NOT SPECIFIED  Final   Special Requests   Final    BOTTLES DRAWN AEROBIC AND ANAEROBIC Blood Culture adequate volume   Culture   Final    NO GROWTH 4 DAYS Performed at [facility], [address] [place], [place] [address]    Report Status PENDING  Incomplete  Resp panel by RT-PCR (RSV, Flu A&B, Covid) Anterior Nasal Swab     Status: None   Collection Time: 01/11/24  8:55 PM   Specimen: Anterior Nasal Swab  Result Value Ref Range Status   SARS Coronavirus 2 by RT PCR NEGATIVE NEGATIVE Final   Influenza A by PCR NEGATIVE NEGATIVE Final   Influenza B by PCR NEGATIVE NEGATIVE Final    Comment: (NOTE) The Xpert Xpress SARS-CoV-2/FLU/RSV plus assay is intended as an aid in the diagnosis of influenza from Nasopharyngeal swab specimens and should not be used as a sole basis for treatment. Nasal washings and aspirates are unacceptable for Xpert Xpress SARS-CoV-2/FLU/RSV testing.  Fact Sheet for Patients: bloggercourse.com  Fact Sheet for Healthcare  Providers: seriousbroker.it  This test is not yet approved or cleared by the [place]  FDA and has been authorized for detection and/or diagnosis of SARS-CoV-2 by FDA under an Emergency Use Authorization (EUA). This EUA will remain in effect (meaning this test can be used) for the duration of the COVID-19 declaration under Section 564(b)(1) of the Act, 21 U.S.C. section 360bbb-3(b)(1), unless the authorization is terminated or revoked.     Resp Syncytial Virus by PCR NEGATIVE NEGATIVE Final    Comment: (NOTE) Fact Sheet for Patients: bloggercourse.com  Fact Sheet for Healthcare Providers: seriousbroker.it  This test is not yet approved or cleared by the [place]  FDA and has been authorized for detection and/or diagnosis of SARS-CoV-2 by FDA under an Emergency Use Authorization (EUA). This EUA will remain in effect (meaning this test can be used) for the duration of the COVID-19 declaration under Section 564(b)(1) of the Act, 21 U.S.C. section 360bbb-3(b)(1), unless the authorization is terminated or revoked.  Performed at [facility], [address] [place], [place] [address]   MRSA Next Gen by PCR, Nasal     Status: None   Collection Time: 01/13/24  9:39 AM   Specimen: Nasal Mucosa; Nasal Swab  Result Value Ref Range Status   MRSA by PCR Next Gen NOT DETECTED NOT DETECTED Final    Comment: (NOTE) The GeneXpert MRSA Assay (FDA approved for NASAL specimens only), is one component of a comprehensive MRSA colonization surveillance program. It is not intended to diagnose MRSA infection nor to guide or monitor treatment for MRSA infections. Test performance is not FDA approved in patients less than 32 years old. Performed at [facility], [address], [place], [place] [address]      Discharge Instructions:   Discharge Instructions     Diet Carb Modified   Complete by: As  directed    Increase activity slowly   Complete by: As directed       Allergies as of 01/15/2024   No Known Allergies      Medication List     STOP taking these medications    Accu-Chek Guide Test test strip Generic drug: glucose blood  Accu-Chek Softclix Lancets lancets       TAKE these medications    albuterol  (2.5 MG/3ML) 0.083% nebulizer solution Commonly known as: PROVENTIL  Inhale 3 mLs (2.5 mg total) by nebulization every 4 (four) hours as needed for wheezing or shortness of breath.   albuterol  108 (90 Base) MCG/ACT inhaler Commonly known as: Ventolin  HFA Inhale 2 puffs into the lungs every 6 (six) hours as needed for wheezing or shortness of breath.   atorvastatin  10 MG tablet Commonly known as: LIPITOR Take 1 tablet (10 mg total) by mouth daily.   Breztri  Aerosphere 160-9-4.8 MCG/ACT Aero inhaler Generic drug: budesonide -glycopyrrolate -formoterol  Inhale 2 puffs into the lungs in the morning and at bedtime.   doxycycline  100 MG tablet Commonly known as: VIBRA -TABS Take 1 tablet (100 mg total) by mouth every 12 (twelve) hours.   folic acid  1 MG tablet Commonly known as: FOLVITE  Take 1 tablet (1 mg total) by mouth daily.   furosemide  40 MG tablet Commonly known as: LASIX  Take 1 tablet (40 mg total) by mouth daily.   glipiZIDE  2.5 MG 24 hr tablet Commonly known as: GLUCOTROL  XL Take 1 tablet (2.5 mg total) by mouth daily with breakfast.   losartan  100 MG tablet Commonly known as: COZAAR  Take 1 tablet (100 mg total) by mouth daily.   metoprolol  succinate 50 MG 24 hr tablet Commonly known as: TOPROL -XL Take 0.5 tablets (25 mg total) by mouth daily. Take with or immediately following a meal. What changed: how much to take   nystatin  ointment Commonly known as: MYCOSTATIN  Apply 1 Application topically 2 (two) times daily. Mix with triamcinolone . Use for 2 weeks.   pantoprazole  40 MG tablet Commonly known as: PROTONIX  Take 1 tablet (40 mg  total) by mouth daily.   predniSONE  50 MG tablet Commonly known as: DELTASONE  50 mg daily x 3 days Start taking on: January 16, 2024 What changed:  medication strength See the new instructions.   pregabalin  100 MG capsule Commonly known as: Lyrica  Take 1 capsule (100 mg total) by mouth daily. What changed:  when to take this Another medication with the same name was removed. Continue taking this medication, and follow the directions you see here.   triamcinolone  0.025 % ointment Commonly known as: KENALOG  Apply 1 Application topically 2 (two) times daily as needed (Skin irritation). Mix with nystatin  ointment and use for 2 weeks. Repeat as needed          Time coordinating discharge: 45 minutes  Signed:  Harlene RAYMOND Bowl DO  [facility] 01/15/2024, 10:50 AM      "

## 2024-01-15 NOTE — Progress Notes (Signed)
 Discharge Nurse Summary: DC order noted per MD. DC RN at bedside with patient. Patient agreeable with discharge plan, agreeable to transport to dc lounge to await cab voucher/transport home. AVS printed/reviewed. PIV removed. Telemonitor not present on assessment. No DME needs. TOC meds delivered to bedside. CP/Edu resolved. Patient dressed and ready to go. All belongings accounted for. Oxygen  delivered to bedside. Patient wheeled downstairs to [employer].  Rosario Lund, RN

## 2024-01-16 ENCOUNTER — Telehealth: Payer: Self-pay | Admitting: *Deleted

## 2024-01-16 LAB — CULTURE, BLOOD (ROUTINE X 2)
Culture: NO GROWTH
Culture: NO GROWTH
Special Requests: ADEQUATE
Special Requests: ADEQUATE

## 2024-01-16 NOTE — Transitions of Care (Post Inpatient/ED Visit) (Signed)
" ° °  01/16/2024  Name: James [name] MRN: [id number] DOB: [birth year]  Today's TOC FU Call Status: Today's TOC FU Call Status:: Unsuccessful Call (1st Attempt) Unsuccessful Call (1st Attempt) Date: 01/16/24  Attempted to reach the patient regarding the most recent Inpatient/ED visit.  Follow Up Plan: Additional outreach attempts will be made to reach the patient to complete the Transitions of Care (Post Inpatient/ED visit) call.   Andrea Dimes RN, BSN   [facility] RN Care Manager [phone]  "

## 2024-01-17 ENCOUNTER — Other Ambulatory Visit (HOSPITAL_COMMUNITY): Payer: Self-pay

## 2024-01-18 ENCOUNTER — Telehealth: Payer: Self-pay | Admitting: *Deleted

## 2024-01-18 DIAGNOSIS — Z5941 Food insecurity: Secondary | ICD-10-CM

## 2024-01-18 NOTE — Transitions of Care (Post Inpatient/ED Visit) (Signed)
 "  01/18/2024  Name: James [name] MRN: [id number] DOB: 03-09-1960  Today's TOC FU Call Status: Today's TOC FU Call Status:: Successful TOC FU Call Completed TOC FU Call Complete Date: 01/18/24  Patient's Name and Date of Birth confirmed. Name, DOB  Transition Care Management Follow-up Telephone Call Date of Discharge: 01/15/24 Discharge Facility: [facility]) Type of Discharge: Inpatient Admission Primary Inpatient Discharge Diagnosis:: acute hypoxic respiratory failure How have you been since you were released from the hospital?: Better Any questions or concerns?: No  Items Reviewed: Did you receive and understand the discharge instructions provided?: Yes Medications obtained,verified, and reconciled?: Yes (Medications Reviewed) Any new allergies since your discharge?: No Dietary orders reviewed?: Yes Type of Diet Ordered:: Low sodium, heart healthy Do you have support at home?: No  Medications Reviewed Today: Medications Reviewed Today     Reviewed by Lucky Andrea LABOR, RN (Registered Nurse) on 01/18/24 at 1430  Med List Status: <None>   Medication Order Taking? Sig Documenting Provider Last Dose Status Informant  albuterol  (PROVENTIL ) (2.5 MG/3ML) 0.083% nebulizer solution [id number] Yes Inhale 3 mLs (2.5 mg total) by nebulization every 4 (four) hours as needed for wheezing or shortness of breath. Danton Jon HERO, PA-C  Active Self, Pharmacy Records  albuterol  (VENTOLIN  HFA) 108 [phone] Base) MCG/ACT inhaler [id number] Yes Inhale 2 puffs into the lungs every 6 (six) hours as needed for wheezing or shortness of breath. Vicci Barnie NOVAK, MD  Active Self, Pharmacy Records  atorvastatin  (LIPITOR) 10 MG tablet [id number] Yes Take 1 tablet (10 mg total) by mouth daily. Vicci Barnie NOVAK, MD  Active Self, Pharmacy Records  budesonide -glycopyrrolate -formoterol  (BREZTRI  AEROSPHERE) 160-9-4.8 MCG/ACT AERO inhaler [id number] Yes Inhale 2 puffs into the lungs in the morning and at bedtime.  Vicci Barnie NOVAK, MD  Active Self, Pharmacy Records  doxycycline  (VIBRA -TABS) 100 MG tablet [id number] Yes Take 1 tablet (100 mg total) by mouth every 12 (twelve) hours. Vann, Jessica U, DO  Active   folic acid  (FOLVITE ) 1 MG tablet [id number] Yes Take 1 tablet (1 mg total) by mouth daily. Vicci Barnie NOVAK, MD  Active Self, Pharmacy Records  furosemide  (LASIX ) 40 MG tablet [id number] Yes Take 1 tablet (40 mg total) by mouth daily. Vicci Barnie NOVAK, MD  Active Self, Pharmacy Records  glipiZIDE  (GLUCOTROL  XL) 2.5 MG 24 hr tablet [id number] Yes Take 1 tablet (2.5 mg total) by mouth daily with breakfast. Vicci Barnie NOVAK, MD  Active Self, Pharmacy Records  losartan  (COZAAR ) 100 MG tablet [id number] Yes Take 1 tablet (100 mg total) by mouth daily. Danton Jon HERO, PA-C  Active Self, Pharmacy Records  metoprolol  succinate (TOPROL -XL) 50 MG 24 hr tablet [id number] Yes Take 0.5 tablets (25 mg total) by mouth daily. Take with or immediately following a meal. Vann, Jessica U, DO  Active   nystatin  ointment (MYCOSTATIN ) [id number] Yes Apply 1 Application topically 2 (two) times daily. Mix with triamcinolone . Use for 2 weeks. Vicci Barnie NOVAK, MD  Active Self, Pharmacy Records           Med Note LEOBARDO, [facility]   Thu Jan 12, 2024  1:44 AM)    pantoprazole  (PROTONIX ) 40 MG tablet [id number] Yes Take 1 tablet (40 mg total) by mouth daily. Vicci Barnie NOVAK, MD  Active Self, Pharmacy Records  predniSONE  (DELTASONE ) 50 MG tablet [id number] Yes Take 1 tablet (50 mg) by mouth daily x 3 days Vann, Jessica U, DO  Active   pregabalin  (LYRICA ) 100 MG capsule [id number] Yes Take 1 capsule (  100 mg total) by mouth daily. Vann, Jessica U, DO  Active   triamcinolone  (KENALOG ) 0.025 % ointment [id number]  Apply 1 Application topically 2 (two) times daily as needed (Skin irritation). Mix with nystatin  ointment and use for 2 weeks. Repeat as needed  Patient not taking: Reported on 01/18/2024   [provider]  Active Self,  Pharmacy Records           Med Note (LEE, NICCI   Thu Jan 12, 2024  1:50 AM) Refills were denied for this medication, AND they are needed to comply with mixture with Nystatin .             Home Care and Equipment/Supplies: Were Home Health Services Ordered?: No Any new equipment or medical supplies ordered?: No  Functional Questionnaire: Do you need assistance with bathing/showering or dressing?: No Do you need assistance with meal preparation?: No Do you need assistance with eating?: No Do you have difficulty maintaining continence: No Do you need assistance with getting out of bed/getting out of a chair/moving?: No Do you have difficulty managing or taking your medications?: No  Follow up appointments reviewed: PCP Follow-up appointment confirmed?: Yes Date of PCP follow-up appointment?: 02/20/24 (Patient declines to make an earlier appointment) Follow-up Provider: Dr. Vicci Specialist [facility] Follow-up appointment confirmed?: No Do you need transportation to your follow-up appointment?: No Do you understand care options if your condition(s) worsen?: Yes-patient verbalized understanding  SDOH Interventions Today    Flowsheet Row Most Recent Value  SDOH Interventions   Food Insecurity Interventions Other (Comment)  [BSW referral placed]  Transportation Interventions Payor Benefit  [Patient utilizing transportation provided by [employer]]  Utilities Interventions Intervention Not Indicated    Goals Addressed             This Visit's Progress    VBCI Transitions of Care (TOC) Care Plan       Problems:  Recent Hospitalization for treatment of COPD Knowledge Deficit Related to COPD and resources and SDOH barrier: patient needs help with food benefit re certification  Goal:  Over the next 30 days, the patient will not experience hospital readmission  Interventions:  Transitions of Care: Durable Medical Equipment (DME) reviewed with patient/caregiver Doctor  Visits  - discussed the importance of doctor visits Post discharge activity limitations prescribed by provider reviewed Post-op wound/incision care reviewed with patient/caregiver Reviewed Signs and symptoms of infection Medication review, discussed the importance of taking antibiotic and prednisone  as directed  SDOH assessment BSW referral placed for assistance with recertification Encouraged patient to continue with alcohol  cessation-patient has not drank alcohol  in one week  COPD Interventions: Advised patient to engage in light exercise as tolerated 3-5 days a week to aid in the the management of COPD Advised patient to self assesses COPD action plan zone and make appointment with provider if in the yellow zone for 48 hours without improvement Assessed social determinant of health barriers Discussed the importance of adequate rest and management of fatigue with COPD Provided education about and advised patient to utilize infection prevention strategies to reduce risk of respiratory infection Use of home oxygen  Provided patient with contact to Pulmonology [phone] to call and schedule an appointment  Patient Self Care Activities:  Attend all scheduled provider appointments Call pharmacy for medication refills 3-7 days in advance of running out of medications Call provider office for new concerns or questions  Notify RN Care Manager of [facility] call rescheduling needs Participate in Transition of Care Program/Attend TOC scheduled calls Take medications  as prescribed   Work with the social worker to address care coordination needs and will continue to work with the clinical team to address health care and disease management related needs  Plan:  Telephone follow up appointment with care management team member scheduled for:  01/25/24 at 1pm       Discussed and offered 30 day TOC program.  Patient      enrolled .  The patient has been provided with contact information for the care  management team and has been advised to call with any health -related questions or concerns.  The patient verbalized understanding with current plan of care.  The patient is directed to their insurance card regarding availability of benefits coverage.    Andrea Dimes RN, BSN [place]  [facility] Health RN Care Manager [phone]  "

## 2024-01-20 ENCOUNTER — Other Ambulatory Visit: Payer: Self-pay

## 2024-01-20 NOTE — Patient Instructions (Signed)
 Visit Information  Thank you for taking time to visit with me today. Please don't hesitate to contact me if I can be of assistance to you before our next scheduled appointment.  Our next appointment is by telephone on 01/31/24 at 10am Please call the care guide team at [phone] if you need to cancel or reschedule your appointment.   Following is a copy of your care plan:   Goals Addressed             This Visit's Progress    BSW Goals       Current SDOH Barriers:  Limited access to food  Interventions: Patient interviewed and appropriate screenings performed Referred patient to community resources  Provided patient with information about Insurance extra benefits and foodstamp application Discussed plans with patient for ongoing follow up and provided patient with direct contact number Advised patient to contact Social Services to request an over the phone foodstamp application since he doesn't have a computer, transportation. Collaborated with [employer]  (community agency) re: to inquire about extra benefits.  Pt will receive $100 gift card.  Will need to call back at next visit to do Care Needs screening as initial Customer Service staff did not complete before transferirng to [employer] Department. Assisted patient/caregiver with obtaining information about health plan benefits Patient pays for transportation to run errands with Gisele.  His insurance does not provide transportation for errands and he is not eligible for SCAT as he reports no mobility concerns.          Please call 911 if you are experiencing a Mental Health or Behavioral Health Crisis or need someone to talk to.  Patient verbalized understanding of Care plan and visit instructions communicated this visit  Tillman Gardener, BSW [place]  [facility], [facility] Social Worker Direct Dial: [phone]  Fax: [phone] Website: [email or web address]

## 2024-01-20 NOTE — Patient Outreach (Signed)
 Social Drivers of Health  Community Resource and Care Coordination Visit Note   01/20/2024  Name: James [name] MRN: [id number] DOB:[birth year]  Situation: Referral received for [facility] needs assessment and assistance related to Food Insecurity . I obtained verbal consent from Patient.  Visit completed with Patient on the phone.   Background:   SDOH Interventions Today    Flowsheet Row Most Recent Value  SDOH Interventions   Food Insecurity Interventions Other (Comment), Assist with SNAP Application  [Pt foodstamps ended 1 year ago and he has not reapplied.  Pt will contact DSS to request to do application by phone due to lack of transportation and computer]  Financial Strain Interventions --  [SW t/c [employer] to enroll in Healthy Rewards.  $100 gift card will arrive in 5 business days and will need to do Care Neeeds screening at next visit for fruit and veg $25]     Assessment:   Goals Addressed             This Visit's Progress    BSW Goals       Current SDOH Barriers:  Limited access to food  Interventions: Patient interviewed and appropriate screenings performed Referred patient to community resources  Provided patient with information about Insurance extra benefits and foodstamp application Discussed plans with patient for ongoing follow up and provided patient with direct contact number Advised patient to contact Social Services to request an over the phone foodstamp application since he doesn't have a computer, transportation. Collaborated with [employer]  (community agency) re: to inquire about extra benefits.  Pt will receive $100 gift card.  Will need to call back at next visit to do Care Needs screening as initial Customer Service staff did not complete before transferirng to Healthy Reward Department. Assisted patient/caregiver with obtaining information about health plan benefits Patient pays for transportation to run errands with Gisele.  His insurance  does not provide transportation for errands and he is not eligible for SCAT as he reports no mobility concerns.          Recommendation:   call and/or follow up with Social Services for food assistance  Follow Up Plan:   Telephone follow up appointment date/time:  01/31/24 at 10am  Tillman Gardener, BSW [place]  [facility], [facility] Social Worker Direct Dial: [phone]  Fax: [phone] Website: [email or web address]

## 2024-01-25 ENCOUNTER — Encounter: Payer: Self-pay | Admitting: *Deleted

## 2024-01-25 ENCOUNTER — Telehealth: Payer: Self-pay | Admitting: *Deleted

## 2024-01-26 ENCOUNTER — Telehealth: Payer: Self-pay | Admitting: *Deleted

## 2024-01-26 NOTE — Patient Instructions (Signed)
 Visit Information  Thank you for taking time to visit with me today. Please don't hesitate to contact me if I can be of assistance to you before our next scheduled telephone appointment.   Following is a copy of your care plan:   Goals Addressed             This Visit's Progress    VBCI Transitions of Care (TOC) Care Plan       Problems:  Recent Hospitalization for treatment of COPD Knowledge Deficit Related to COPD and resources and SDOH barrier: patient needs help with food benefit re certification  Goal:  Over the next 30 days, the patient will not experience hospital readmission  Interventions:  Transitions of Care: Doctor Visits  - discussed the importance of doctor visits Post discharge activity limitations prescribed by provider reviewed Medication review, discussed the importance of requesting refills prior to running out  BSW referral placed for assistance with recertification-patient will call and recertify over the phone and follow up with BSW on 01/31/24 Encouraged patient to continue with alcohol  cessation-patient has not drank alcohol  since admission Assisted with scheduling Pulmonology follow up on 02/21/24 Discussed upcoming winter weather, patient will contact [employer] for extra oxygen  tanks to prepare for possible power outage  COPD Interventions: Advised patient to engage in light exercise as tolerated 3-5 days a week to aid in the the management of COPD Advised patient to self assesses COPD action plan zone and make appointment with provider if in the yellow zone for 48 hours without improvement Discussed the importance of adequate rest and management of fatigue with COPD Provided education about and advised patient to utilize infection prevention strategies to reduce risk of respiratory infection Use of home oxygen   Patient Self Care Activities:  Attend all scheduled provider appointments Call pharmacy for medication refills 3-7 days in advance of running out  of medications Call provider office for new concerns or questions  Notify RN Care Manager of TOC call rescheduling needs Participate in Transition of Care Program/Attend TOC scheduled calls Take medications as prescribed   Work with the social worker to address care coordination needs and will continue to work with the clinical team to address health care and disease management related needs  Plan:  Telephone follow up appointment with care management team member scheduled for:  02/01/24 at 10am        The patient verbalized understanding of instructions, educational materials, and care plan provided today and agreed to receive a mailed copy of patient instructions, educational materials, and care plan.   Telephone follow up appointment with care management team member scheduled for:02/01/24 at 10am  Please call the care guide team at [phone] if you need to cancel or reschedule your appointment.   Please call 1-800-273-TALK (toll free, 24 hour hotline) go to [facility] Urgent [facility] [address] [place] [phone]) call 911 if you are experiencing a Mental Health or Behavioral Health Crisis or need someone to talk to.  Andrea Dimes RN, BSN [place]  Value-Based Care Institute [facility] RN Care Manager [phone]

## 2024-01-26 NOTE — Transitions of Care (Post Inpatient/ED Visit) (Signed)
 " Transition of Care week 2  Visit Note  01/26/2024  Name: James [name] MRN: [id number]          DOB: 09/03/1960  Situation: Patient enrolled in [facility] 30-day program. Visit completed with James [name] by telephone.   Background:   Initial Transition Care Management Follow-up Telephone Call Discharge Date and Diagnosis: 01/15/24, acute hypoxic respiratory failure   Past Medical History:  Diagnosis Date   Acute on chronic respiratory failure with hypoxia (HCC) 06/08/2013   Acute on chronic respiratory failure with hypoxia and hypercapnia (HCC) 06/08/2013   AKI (acute kidney injury) 07/12/2022   Alcohol  use disorder 11/23/2018   CAP (community acquired pneumonia) 05/17/2018   See admit 05/17/18 ? rml  with   covid pcr neg - rx augmentin  > f/u cxr in 4-6 weeks is fine unless condition declines    Community acquired pneumonia 07/12/2021   Community acquired pneumonia of right lower lobe of lung 05/17/2018   See admit 05/17/18 ? 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   Recurrent pneumonia 02/20/2021   Tick bite 06/03/2018    Assessment: Patient Reported Symptoms: Cognitive Cognitive Status: Able to follow simple commands, Alert and oriented to person, place, and time, Normal speech and language skills      Neurological Neurological Review of Symptoms: No symptoms reported    HEENT HEENT Symptoms Reported: No symptoms reported      Cardiovascular Cardiovascular Symptoms Reported: No symptoms reported     Respiratory Respiratory Symptoms Reported: Shortness of breath Additional Respiratory Details: Continues with SOB on exertion, patient has not rescheduled missed Pulmonology visit. Respiratory Management Strategies: Adequate rest, Coping strategies, Oxygen  therapy, Routine screening, Medication therapy Respiratory Self-Management Outcome: 4 (good) (RNCM assisted with rescheduling with Pulmonology on 02/21/24 at 2pm)  Endocrine Endocrine Symptoms Reported: No symptoms reported    Gastrointestinal Gastrointestinal Symptoms Reported: No symptoms reported      Genitourinary Genitourinary Symptoms Reported: No symptoms reported    Integumentary Integumentary Symptoms Reported: Rash Additional Integumentary Details: Patient using prescribed ointments which help, but the rash returns. Patient plans to discuss with PCP on 02/20/24    Musculoskeletal Musculoskelatal Symptoms Reviewed: No symptoms reported        Psychosocial Psychosocial Symptoms Reported: No symptoms reported         There were no vitals filed for this visit. Pain Score: 0-No pain  Medications Reviewed Today     Reviewed by James [name] (Registered Nurse) on 01/26/24 at 1126  Med List Status: <None>   Medication Order Taking? Sig Documenting Provider Last Dose Status Informant  albuterol  (PROVENTIL ) (2.5 MG/3ML) 0.083% nebulizer solution [id number] Yes Inhale 3 mLs (2.5 mg total) by nebulization every 4 (four) hours as needed for wheezing or shortness of breath. James Jon HERO, PA-C  Active Self, Pharmacy Records  albuterol  (VENTOLIN  HFA) 108 [phone] Base) MCG/ACT inhaler [id number] Yes Inhale 2 puffs into the lungs every 6 (six) hours as needed for wheezing or shortness of breath. Vicci,  James NOVAK, MD  Active Self, Pharmacy Records  atorvastatin  (LIPITOR) 10 MG tablet [id number] Yes Take 1 tablet (10 mg total) by mouth daily. Vicci James NOVAK, MD  Active Self, Pharmacy Records  budesonide -glycopyrrolate -formoterol   (BREZTRI  AEROSPHERE) 160-9-4.8 MCG/ACT AERO inhaler [id number] Yes Inhale 2 puffs into the lungs in the morning and at bedtime. Vicci James NOVAK, MD  Active Self, Pharmacy Records  doxycycline  (VIBRA -TABS) 100 MG tablet [id number]  Take 1 tablet (100 mg total) by mouth every 12 (twelve) hours.  Patient not taking: Reported on 01/26/2024   James [name]  Active   folic acid  (FOLVITE ) 1 MG tablet [id number] Yes Take 1 tablet (1 mg total) by mouth daily. Vicci James NOVAK, MD  Active Self, Pharmacy Records  furosemide  (LASIX ) 40 MG tablet [id number] Yes Take 1 tablet (40 mg total) by mouth daily. Vicci James NOVAK, MD  Active Self, Pharmacy Records  glipiZIDE  (GLUCOTROL  XL) 2.5 MG 24 hr tablet [id number] Yes Take 1 tablet (2.5 mg total) by mouth daily with breakfast. Vicci James NOVAK, MD  Active Self, Pharmacy Records  losartan  (COZAAR ) 100 MG tablet [id number] Yes Take 1 tablet (100 mg total) by mouth daily. James Jon HERO, PA-C  Active Self, Pharmacy Records  metoprolol  succinate (TOPROL -XL) 50 MG 24 hr tablet [id number] Yes Take 0.5 tablets (25 mg total) by mouth daily. Take with or immediately following a meal. James [name]  Active   nystatin  ointment (MYCOSTATIN ) [id number] Yes Apply 1 Application topically 2 (two) times daily. Mix with triamcinolone . Use for 2 weeks. Vicci James NOVAK, MD  Active Self, Pharmacy Records           Med Note James [name], [facility]   Thu Jan 12, 2024  1:44 AM)    pantoprazole  (PROTONIX ) 40 MG tablet [id number] Yes Take 1 tablet (40 mg total) by mouth daily. Vicci James NOVAK, MD  Active Self, Pharmacy Records  predniSONE  (DELTASONE ) 10 MG tablet [id number] Yes Take 10 mg by mouth daily with breakfast. [provider]  Active   predniSONE  (DELTASONE ) 50 MG tablet [id number]  Take 1 tablet (50 mg) by mouth daily x 3 days  Patient not taking: Reported on 01/26/2024   James [name]  Active   pregabalin  (LYRICA ) 100 MG capsule [id number] Yes Take 1 capsule  (100 mg total) by mouth daily. James [name]  Active   triamcinolone  (KENALOG ) 0.025 % ointment [id number]  Apply 1 Application topically 2 (two) times daily as needed (Skin irritation). Mix with nystatin  ointment and use for 2 weeks. Repeat as needed  Patient not taking: Reported on 01/26/2024   [provider]  Active Self, Pharmacy Records           Med Note (LEE, NICCI   Thu Jan 12, 2024  1:50 AM) Refills were denied for this medication, AND they are needed to comply with mixture with Nystatin .             Recommendation:   Continue Current Plan of Care  Follow Up Plan:   Telephone follow-up in 1 week  Andrea Dimes [name], BSN [place]  Value-Based Care Institute [facility] [name] Care Manager [phone]     "

## 2024-01-31 ENCOUNTER — Other Ambulatory Visit: Payer: Self-pay

## 2024-01-31 NOTE — Patient Instructions (Signed)
 Visit Information  Thank you for taking time to visit with me today. Please don't hesitate to contact me if I can be of assistance to you before our next scheduled appointment.  Your next care management appointment is by telephone on 02/02/24 at 9:00 am  Please call the care guide team at [phone] if you need to cancel, schedule, or reschedule an appointment.   Please call 911 if you are experiencing a Mental Health or Behavioral Health Crisis or need someone to talk to.  Tillman Gardener, BSW [place]  [facility], [facility] Social Worker Direct Dial: [phone]  Fax: [phone] Website: [email or web address]

## 2024-01-31 NOTE — Patient Outreach (Signed)
 Social Drivers of Health  Community Resource and Care Coordination Visit Note   01/31/2024  Name: James [name] MRN: [id number] DOB:Jun 02, 1960  Situation: Referral received for [facility] needs assessment and assistance related to Food Insecurity . I obtained verbal consent from Patient.  Visit completed with Patient on the phone.   Background:   SDOH Interventions Today    Flowsheet Row Most Recent Value  SDOH Interventions   Food Insecurity Interventions --  [Pt has not contacted Foodstamps to apply.  SW t/c DSS but the office is closed. Pt has food and will receive income at the end of the week to purchase more.]  Financial Strain Interventions --  [Pt did not receive the gift card from his insurance and will continue to watch the mail.]     Assessment:   Goals Addressed             This Visit's Progress    BSW Goals       Current SDOH Barriers:  Limited access to food  Interventions: Patient interviewed and appropriate screenings performed Referred patient to community resources  Provided patient with information about Insurance extra benefits and foodstamp application Discussed plans with patient for ongoing follow up and provided patient with direct contact number Advised patient to contact Social Services to request an over the phone foodstamp application since he doesn't have a computer, transportation. SW t/c DSS but the office is closed due to weather.        Recommendation:   SW will assist pt with contacting DSS when the office opens.  Follow Up Plan:   Telephone follow up appointment date/time:  02/02/24 at 9am  Tillman Gardener, BSW [place]  [facility], [facility] Social Worker Direct Dial: [phone]  Fax: [phone] Website: [email or web address]

## 2024-02-01 ENCOUNTER — Other Ambulatory Visit: Payer: Self-pay | Admitting: *Deleted

## 2024-02-01 NOTE — Transitions of Care (Post Inpatient/ED Visit) (Signed)
 " Transition of Care week 3  Visit Note  02/01/2024  Name: James [name] MRN: [id number]          DOB: [birth year]  Situation: Patient enrolled in [facility] 30-day program. Visit completed with James [name] by telephone.   Background:   Initial Transition Care Management Follow-up Telephone Call Discharge Date and Diagnosis: 01/15/24, acute hypoxic respiratory failure   Past Medical History:  Diagnosis Date   Acute on chronic respiratory failure with hypoxia (HCC) 06/08/2013   Acute on chronic respiratory failure with hypoxia and hypercapnia (HCC) 06/08/2013   AKI (acute kidney injury) 07/12/2022   Alcohol  use disorder 11/23/2018   CAP (community acquired pneumonia) 05/17/2018   See admit 05/17/18 ? rml  with   covid pcr neg - rx augmentin  > f/u cxr in 4-6 weeks is fine unless condition declines    Community acquired pneumonia 07/12/2021   Community acquired pneumonia of right lower lobe of lung 05/17/2018   See admit 05/17/18 ? 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   Recurrent pneumonia 02/20/2021   Tick bite 06/03/2018    Assessment: Patient Reported Symptoms: Cognitive Cognitive Status: Able to follow simple commands, Alert and oriented to person, place, and time, Normal speech and language skills      Neurological Neurological Review of Symptoms: No symptoms reported    HEENT HEENT Symptoms Reported: No symptoms reported      Cardiovascular Cardiovascular Symptoms Reported: No symptoms reported     Respiratory Respiratory Symptoms Reported: No symptoms reported Respiratory Management Strategies: Adequate rest, Coping strategies, Oxygen  therapy, Routine screening Respiratory Self-Management Outcome: 4 (good)  Endocrine Endocrine Symptoms Reported: Not assessed    Gastrointestinal Gastrointestinal Symptoms Reported: No symptoms reported      Genitourinary Genitourinary Symptoms Reported: No symptoms reported    Integumentary Integumentary Symptoms Reported: Rash Skin Management Strategies: Adequate rest, Routine screening, Medication therapy Skin Self-Management Outcome: 3 (uncertain)  Musculoskeletal Musculoskelatal Symptoms Reviewed: No symptoms reported        Psychosocial Psychosocial Symptoms Reported: No symptoms reported Additional Psychological Details: Patient has not drank alcohol  since his admission         There were no vitals filed for this visit.    Medications Reviewed Today     Reviewed by Lucky Andrea LABOR, RN (Registered Nurse) on 02/01/24 at 1024  Med List Status: <None>   Medication Order Taking? Sig Documenting Provider Last Dose Status Informant  albuterol  (PROVENTIL ) (2.5 MG/3ML) 0.083% nebulizer solution [id number] Yes Inhale 3 mLs (2.5 mg total) by nebulization every 4 (four) hours as needed for wheezing or shortness of breath. [place] Jon HERO, PA-C  Active Self, Pharmacy Records  albuterol  (VENTOLIN  HFA) 108 [phone] Base) MCG/ACT inhaler [id number] Yes Inhale 2 puffs into the lungs every 6 (six) hours as needed for wheezing or shortness of breath. Vicci Barnie NOVAK, MD  Active Self, Pharmacy Records  atorvastatin  (LIPITOR) 10 MG tablet [id number] Yes Take 1 tablet (10 mg total) by mouth daily. Vicci Barnie NOVAK,  MD  Active Self, Pharmacy Records  budesonide -glycopyrrolate -formoterol  (BREZTRI  AEROSPHERE) 160-9-4.8 MCG/ACT AERO inhaler [id number] Yes Inhale 2 puffs into the lungs in the morning and at bedtime. Vicci Barnie NOVAK, MD  Active Self, Pharmacy  Records  doxycycline  (VIBRA -TABS) 100 MG tablet [id number]  Take 1 tablet (100 mg total) by mouth every 12 (twelve) hours.  Patient not taking: Reported on 02/01/2024   Vann, Jessica U, DO  Active   folic acid  (FOLVITE ) 1 MG tablet [id number] Yes Take 1 tablet (1 mg total) by mouth daily. Vicci Barnie NOVAK, MD  Active Self, Pharmacy Records  furosemide  (LASIX ) 40 MG tablet [id number] Yes Take 1 tablet (40 mg total) by mouth daily. Vicci Barnie NOVAK, MD  Active Self, Pharmacy Records  glipiZIDE  (GLUCOTROL  XL) 2.5 MG 24 hr tablet [id number] Yes Take 1 tablet (2.5 mg total) by mouth daily with breakfast. Vicci Barnie NOVAK, MD  Active Self, Pharmacy Records  losartan  (COZAAR ) 100 MG tablet [id number] Yes Take 1 tablet (100 mg total) by mouth daily. [place] Jon HERO, PA-C  Active Self, Pharmacy Records  metoprolol  succinate (TOPROL -XL) 50 MG 24 hr tablet [id number] Yes Take 0.5 tablets (25 mg total) by mouth daily. Take with or immediately following a meal. Vann, Jessica U, DO  Active   nystatin  ointment (MYCOSTATIN ) [id number] Yes Apply 1 Application topically 2 (two) times daily. Mix with triamcinolone . Use for 2 weeks. Vicci Barnie NOVAK, MD  Active Self, Pharmacy Records           Med Note LEOBARDO, [facility]   Thu Jan 12, 2024  1:44 AM)    pantoprazole  (PROTONIX ) 40 MG tablet [id number] Yes Take 1 tablet (40 mg total) by mouth daily. Vicci Barnie NOVAK, MD  Active Self, Pharmacy Records  predniSONE  (DELTASONE ) 10 MG tablet [id number] Yes Take 10 mg by mouth daily with breakfast. [provider]  Active   predniSONE  (DELTASONE ) 50 MG tablet [id number]  Take 1 tablet (50 mg) by mouth daily x 3 days  Patient not taking: Reported on 02/01/2024   Vann, Jessica U, DO  Active   pregabalin  (LYRICA ) 100 MG capsule [id number]  Take 1 capsule (100 mg total) by mouth daily.  Patient not taking: Reported on 02/01/2024   Vann, Jessica U, DO  Active   triamcinolone  (KENALOG ) 0.025 % ointment [id number]  Apply 1  Application topically 2 (two) times daily as needed (Skin irritation). Mix with nystatin  ointment and use for 2 weeks. Repeat as needed  Patient not taking: Reported on 02/01/2024   [provider]  Active Self, Pharmacy Records           Med Note (LEE, NICCI   Thu Jan 12, 2024  1:50 AM) Refills were denied for this medication, AND they are needed to comply with mixture with Nystatin .             Recommendation:   Continue Current Plan of Care  Follow Up Plan:   Telephone follow-up in 1 week  Andrea Dimes RN, BSN [place]  Value-Based Care Institute [facility] RN Care Manager [phone]     "

## 2024-02-01 NOTE — Patient Instructions (Signed)
 Visit Information  Thank you for taking time to visit with me today. Please don't hesitate to contact me if I can be of assistance to you before our next scheduled telephone appointment.  Following is a copy of your care plan:   Goals Addressed             This Visit's Progress    VBCI Transitions of Care (TOC) Care Plan       Problems:  Recent Hospitalization for treatment of COPD Knowledge Deficit Related to COPD and resources and SDOH barrier: patient needs help with food benefit re certification  Goal:  Over the next 30 days, the patient will not experience hospital readmission  Interventions:  Transitions of Care: Doctor Visits  - discussed the importance of doctor visits Post discharge activity limitations prescribed by provider reviewed Medication review, discussed the importance of requesting refills prior to running out and making a payment to continue prescription delivery  BSW referral placed for assistance with recertification-follow up with BSW on 02/02/24 for assistance with contacting DSS Encouraged patient to continue with alcohol  cessation-patient continues with cessation Discussed upcoming winter weather, patient has 3 extra oxygen  tanks and feels he is prepared  COPD Interventions: Advised patient to engage in light exercise as tolerated 3-5 days a week to aid in the the management of COPD Provided education about and advised patient to utilize infection prevention strategies to reduce risk of respiratory infection Provided patient with basic written and verbal COPD education on self care/management/and exacerbation prevention Use of home oxygen  Reviewed appointment with Pulmonology on 02/21/24, advised arranging transportation  Patient Self Care Activities:  Attend all scheduled provider appointments Call pharmacy for medication refills 3-7 days in advance of running out of medications Call provider office for new concerns or questions  Notify RN Care Manager  of [facility] call rescheduling needs Participate in Transition of Care Program/Attend TOC scheduled calls Take medications as prescribed   Work with the social worker to address care coordination needs and will continue to work with the clinical team to address health care and disease management related needs  Plan:  Telephone follow up appointment with care management team member scheduled for:  02/10/24 at 10am        The patient verbalized understanding of instructions, educational materials, and care plan provided today and agreed to receive a mailed copy of patient instructions, educational materials, and care plan.   Telephone follow up appointment with care management team member scheduled for:02/10/24 at 10am  Please call the care guide team at [phone] if you need to cancel or reschedule your appointment.   Please call 1-800-273-TALK (toll free, 24 hour hotline) go to [facility] Urgent [facility] [address], [place] [phone]) call 911 if you are experiencing a Mental Health or Behavioral Health Crisis or need someone to talk to.  Andrea Dimes RN, BSN [place]  Value[facility]Based Care Institute [facility] Health RN Care Manager [phone]

## 2024-02-02 ENCOUNTER — Other Ambulatory Visit: Payer: Self-pay

## 2024-02-02 NOTE — Patient Instructions (Signed)
 Visit Information  Thank you for taking time to visit with me today. Please don't hesitate to contact me if I can be of assistance to you before our next scheduled appointment.  Your next care management appointment is by telephone on 02/09/24 at 10:30am   Please call the care guide team at [phone] if you need to cancel, schedule, or reschedule an appointment.   Please call 911 if you are experiencing a Mental Health or Behavioral Health Crisis or need someone to talk to.  Tillman Gardener, BSW [place]  [facility], [facility] Social Worker Direct Dial: [phone]  Fax: [phone] Website: [email or web address]

## 2024-02-02 NOTE — Patient Outreach (Addendum)
 Social Drivers of Health  Community Resource and Care Coordination Visit Note   02/02/2024  Name: James [name] MRN: [id number] DOB:August 26, 1960  Situation: Referral received for [facility] needs assessment and assistance related to Food Insecurity . I obtained verbal consent from Patient.  Visit completed with Patient on the phone.   Background:   SDOH Interventions Today    Flowsheet Row Most Recent Value  SDOH Interventions   Food Insecurity Interventions Other (Comment)  [T/c DSS and staff will call patient in 48 hrs to complete over the phone application.]     Assessment:   Goals Addressed             This Visit's Progress    BSW Goals       Current SDOH Barriers:  Limited access to food  Interventions: Patient interviewed and appropriate screenings performed Referred patient to community resources  Provided patient with information about Insurance extra benefits and foodstamp application Discussed plans with patient for ongoing follow up and provided patient with direct contact number. Patient has not been able to visit DSS to apply for foodstamps. SW t/c DSS and spoke to staff.  Someone from [employer] will contact patient within 48 hr to complete the application by phone.        Recommendation:   complete SNAP application  Follow Up Plan:   Telephone follow up appointment date/time:  02/09/24 at 10:30am  Tillman Gardener, BSW [place]  [facility], [facility] Social Worker Direct Dial: [phone]  Fax: [phone] Website: [email or web address]

## 2024-02-09 ENCOUNTER — Other Ambulatory Visit: Payer: Self-pay

## 2024-02-09 NOTE — Patient Instructions (Signed)
 Visit Information  Thank you for taking time to visit with me today. Please don't hesitate to contact me if I can be of assistance to you before our next scheduled appointment.  Your next care management appointment is by telephone on 02/23/24 at 10am   Please call the care guide team at [phone] if you need to cancel, schedule, or reschedule an appointment.   Please call 911 if you are experiencing a Mental Health or Behavioral Health Crisis or need someone to talk to.  Tillman Gardener, BSW [place]  [facility], [facility] Social Worker Direct Dial: [phone]  Fax: [phone] Website: [email or web address]

## 2024-02-09 NOTE — Patient Outreach (Signed)
 Social Drivers of Health  Community Resource and Care Coordination Visit Note   02/09/2024  Name: James [name] MRN: [id number] DOB:April 06, 1960  Situation: Referral received for [facility] needs assessment and assistance related to Food Insecurity . I obtained verbal consent from Patient.  Visit completed with Patient on the phone.   Background:   SDOH Interventions Today    Flowsheet Row Most Recent Value  SDOH Interventions   Food Insecurity Interventions Assist with SNAP Application  [Pt spoke to [employer] and completed application.Pt was told a new Foodstamp card will arrive in 7-10 business days. Pt received SSA benefits to purchase food.]     Assessment:   Goals Addressed             This Visit's Progress    BSW Goals       Current SDOH Barriers:  Limited access to food  Interventions: Patient interviewed and appropriate screenings performed Discussed plans with patient for ongoing follow up and provided patient with direct contact number. Patient has applied for foodstamps. SW will follow up in 2 weeks to see if new Foodstamp card arrived        Recommendation:   call and/or follow up with Food[employer] for food assistance  Follow Up Plan:   Telephone follow up appointment date/time:  02/23/24 at 10am  Tillman Gardener, BSW [place]  [facility], [facility] Social Worker Direct Dial: [phone]  Fax: [phone] Website: [email or web address]

## 2024-02-10 ENCOUNTER — Other Ambulatory Visit: Payer: Self-pay | Admitting: *Deleted

## 2024-02-10 NOTE — Patient Instructions (Signed)
 Visit Information  Thank you for taking time to visit with me today. Please don't hesitate to contact me if I can be of assistance to you before our next scheduled telephone appointment.   Following is a copy of your care plan:   Goals Addressed             This Visit's Progress    VBCI Transitions of Care (TOC) Care Plan       Problems:  Recent Hospitalization for treatment of COPD Knowledge Deficit Related to COPD and resources and SDOH barrier: patient needs help with food benefit re certification  Goal:  Over the next 30 days, the patient will not experience hospital readmission  Interventions:  Transitions of Care: Doctor Visits  - discussed the importance of doctor visits Post discharge activity limitations prescribed by provider reviewed Medication review, discussed the importance of requesting refills prior to running out and making a payment to continue prescription delivery  BSW referral placed for assistance with recertification-BSW assisted with application, patient needs to go in person to take requested information Encouraged patient to continue with alcohol  cessation-patient continues with cessation  COPD Interventions: Advised patient to engage in light exercise as tolerated 3-5 days a week to aid in the the management of COPD Provided education about and advised patient to utilize infection prevention strategies to reduce risk of respiratory infection Provided patient with basic written and verbal COPD education on self care/management/and exacerbation prevention Use of home oxygen  Reviewed appointment with Pulmonology on 02/21/24, advised arranging transportation Discussed avoiding triggers  Patient Self Care Activities:  Attend all scheduled provider appointments Call pharmacy for medication refills 3-7 days in advance of running out of medications Call provider office for new concerns or questions  Notify RN Care Manager of [facility] call rescheduling  needs Participate in Transition of Care Program/Attend TOC scheduled calls Take medications as prescribed   Work with the social worker to address care coordination needs and will continue to work with the clinical team to address health care and disease management related needs  Plan:  Telephone follow up appointment with care management team member scheduled for:  02/16/24 at 10:30 am        Patient verbalizes understanding of instructions and care plan provided today and agrees to view in MyChart. Active MyChart status and patient understanding of how to access instructions and care plan via MyChart confirmed with patient.     Telephone follow up appointment with care management team member scheduled for:02/16/24 at 10:30am  Please call the care guide team at [phone] if you need to cancel or reschedule your appointment.   Please call 1-800-273-TALK (toll free, 24 hour hotline) go to [facility] Urgent [facility] [address], [place] [phone]) call 911 if you are experiencing a Mental Health or Behavioral Health Crisis or need someone to talk to.  Andrea Dimes RN, BSN [place]  Value-Based Care Institute [facility] Health RN Care Manager [phone]

## 2024-02-10 NOTE — Transitions of Care (Post Inpatient/ED Visit) (Signed)
 " Transition of Care week 4  Visit Note  02/10/2024  Name: James [name] MRN: [id number]          DOB: 15-Aug-1960  Situation: Patient enrolled in [name] 30-day program. Visit completed with James [name] by telephone.   Background:   Initial Transition Care Management Follow-up Telephone Call Discharge Date and Diagnosis: 01/15/24, acute hypoxic respiratory failure   Past Medical History:  Diagnosis Date   Acute on chronic respiratory failure with hypoxia (HCC) 06/08/2013   Acute on chronic respiratory failure with hypoxia and hypercapnia (HCC) 06/08/2013   AKI (acute kidney injury) 07/12/2022   Alcohol  use disorder 11/23/2018   CAP (community acquired pneumonia) 05/17/2018   See admit 05/17/18 ? rml  with   covid pcr neg - rx augmentin  > f/u cxr in 4-6 weeks is fine unless condition declines    Community acquired pneumonia 07/12/2021   Community acquired pneumonia of right lower lobe of lung 05/17/2018   See admit 05/17/18 ? 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   Recurrent pneumonia 02/20/2021   Tick bite 06/03/2018    Assessment: Patient Reported Symptoms: Cognitive Cognitive Status: Able to follow simple commands, Alert and oriented to person, place, and time, Normal speech and language skills      Neurological Neurological Review of Symptoms: No symptoms reported    HEENT HEENT Symptoms Reported: No symptoms reported      Cardiovascular Cardiovascular Symptoms Reported: No symptoms reported Cardiovascular  Management Strategies: Medication therapy, Routine screening, Coping strategies, Adequate rest Cardiovascular Self-Management Outcome: 4 (good)  Respiratory Respiratory Symptoms Reported: No symptoms reported Additional Respiratory Details: scheduled with pulmonolgy on 02/21/24 Respiratory Management Strategies: Adequate rest, Oxygen  therapy, Routine screening Respiratory Self-Management Outcome: 4 (good)  Endocrine Endocrine Symptoms Reported: No symptoms reported    Gastrointestinal Gastrointestinal Symptoms Reported: No symptoms reported      Genitourinary Genitourinary Symptoms Reported: Frequency Additional Genitourinary Details: patient reports kidney pain and frequency with urination that he relates to his fluid pill Genitourinary Management Strategies: Adequate rest Genitourinary Self-Management Outcome: 3 (uncertain) Genitourinary Comment: Advised patient to discuss concern with PCP at next visit, seek medical attention if he develops a fever or blood with urination.  Integumentary Integumentary Symptoms Reported: Rash Skin Management Strategies: Adequate rest, Routine screening Skin Self-Management Outcome: 3 (uncertain)  Musculoskeletal Musculoskelatal Symptoms Reviewed: No symptoms reported        Psychosocial Psychosocial Symptoms Reported: No symptoms reported         There were no vitals filed for this visit. Pain Scale: Faces Pain Score: 5  Pain Type: Acute pain Pain Location: Flank Pain Orientation: Right, Left  Medications Reviewed Today     Reviewed by Lucky Andrea LABOR, RN (Registered Nurse) on 02/10/24 at 1032  Med List Status: <None>   Medication Order Taking? Sig Documenting Provider Last Dose Status Informant  albuterol  (PROVENTIL ) (2.5 MG/3ML) 0.083% nebulizer solution [id number] Yes Inhale 3 mLs (2.5 mg total) by nebulization every 4 (four) hours as needed for  wheezing or shortness of breath. Danton Jon HERO, PA-C  Active Self, Pharmacy Records   albuterol  (VENTOLIN  HFA) [phone] Base) MCG/ACT inhaler [id number] Yes Inhale 2 puffs into the lungs every 6 (six) hours as needed for wheezing or shortness of breath. Vicci Barnie NOVAK, MD  Active Self, Pharmacy Records  atorvastatin  (LIPITOR) 10 MG tablet [id number] Yes Take 1 tablet (10 mg total) by mouth daily. Vicci Barnie NOVAK, MD  Active Self, Pharmacy Records  budesonide -glycopyrrolate -formoterol  (BREZTRI  AEROSPHERE) 160-9-4.8 MCG/ACT AERO inhaler [id number] Yes Inhale 2 puffs into the lungs in the morning and at bedtime. Vicci Barnie NOVAK, MD  Active Self, Pharmacy Records  doxycycline  (VIBRA -TABS) 100 MG tablet [id number]  Take 1 tablet (100 mg total) by mouth every 12 (twelve) hours.  Patient not taking: Reported on 02/10/2024   Vann, Jessica U, DO  Active   folic acid  (FOLVITE ) 1 MG tablet [id number] Yes Take 1 tablet (1 mg total) by mouth daily. Vicci Barnie NOVAK, MD  Active Self, Pharmacy Records  furosemide  (LASIX ) 40 MG tablet [id number] Yes Take 1 tablet (40 mg total) by mouth daily. Vicci Barnie NOVAK, MD  Active Self, Pharmacy Records  glipiZIDE  (GLUCOTROL  XL) 2.5 MG 24 hr tablet [id number] Yes Take 1 tablet (2.5 mg total) by mouth daily with breakfast. Vicci Barnie NOVAK, MD  Active Self, Pharmacy Records  losartan  (COZAAR ) 100 MG tablet [id number] Yes Take 1 tablet (100 mg total) by mouth daily. Danton Jon HERO, PA-C  Active Self, Pharmacy Records  metoprolol  succinate (TOPROL -XL) 50 MG 24 hr tablet [id number] Yes Take 0.5 tablets (25 mg total) by mouth daily. Take with or immediately following a meal. Vann, Jessica U, DO  Active   nystatin  ointment (MYCOSTATIN ) [id number]  Apply 1 Application topically 2 (two) times daily. Mix with triamcinolone . Use for 2 weeks.  Patient not taking: Reported on 02/10/2024   Vicci Barnie NOVAK, MD  Active Self, Pharmacy Records           Med Note James [name], James [name]   Thu Jan 12, 2024  1:44 AM)    pantoprazole  (PROTONIX ) 40 MG tablet [id number] Yes Take 1 tablet  (40 mg total) by mouth daily. Vicci Barnie NOVAK, MD  Active Self, Pharmacy Records  predniSONE  (DELTASONE ) 10 MG tablet [id number] Yes Take 10 mg by mouth daily with breakfast. [provider]  Active   predniSONE  (DELTASONE ) 50 MG tablet [id number]  Take 1 tablet (50 mg) by mouth daily x 3 days  Patient not taking: Reported on 02/10/2024   Vann, Jessica U, DO  Active   pregabalin  (LYRICA ) 100 MG capsule [id number]  Take 1 capsule (100 mg total) by mouth daily.  Patient not taking: Reported on 02/10/2024   Vann, Jessica U, DO  Active   triamcinolone  (KENALOG ) 0.025 % ointment [id number]  Apply 1 Application topically 2 (two) times daily as needed (Skin irritation). Mix with nystatin  ointment and use for 2 weeks. Repeat as needed  Patient not taking: Reported on 02/10/2024   [provider]  Active Self, Pharmacy Records           Med Note (LEE, NICCI   Thu Jan 12, 2024  1:50 AM) Refills were denied for this medication, AND they are needed to comply with mixture with Nystatin .             Recommendation:   Continue Current Plan of Care  Follow Up Plan:   Telephone follow-up in 1 week  Andrea Dimes RN, BSN [name]  Institute [facility] Health RN Care Manager [phone]     "

## 2024-02-16 ENCOUNTER — Telehealth: Admitting: *Deleted

## 2024-02-20 ENCOUNTER — Ambulatory Visit: Admitting: Internal Medicine

## 2024-02-21 ENCOUNTER — Ambulatory Visit

## 2024-02-23 ENCOUNTER — Telehealth
